# Patient Record
Sex: Male | Born: 1950
Health system: Southern US, Community
[De-identification: ages and names within clinical notes are randomized; demographics above are authoritative.]

## PROBLEM LIST (undated history)

## (undated) DIAGNOSIS — I2510 Atherosclerotic heart disease of native coronary artery without angina pectoris: Secondary | ICD-10-CM

## (undated) DIAGNOSIS — E785 Hyperlipidemia, unspecified: Secondary | ICD-10-CM

## (undated) DIAGNOSIS — Z9889 Other specified postprocedural states: Secondary | ICD-10-CM

## (undated) DIAGNOSIS — Z952 Presence of prosthetic heart valve: Secondary | ICD-10-CM

## (undated) DIAGNOSIS — J189 Pneumonia, unspecified organism: Secondary | ICD-10-CM

## (undated) DIAGNOSIS — E859 Amyloidosis, unspecified: Secondary | ICD-10-CM

## (undated) DIAGNOSIS — M47816 Spondylosis without myelopathy or radiculopathy, lumbar region: Secondary | ICD-10-CM

## (undated) DIAGNOSIS — R197 Diarrhea, unspecified: Secondary | ICD-10-CM

## (undated) DIAGNOSIS — D649 Anemia, unspecified: Secondary | ICD-10-CM

## (undated) DIAGNOSIS — K219 Gastro-esophageal reflux disease without esophagitis: Secondary | ICD-10-CM

## (undated) DIAGNOSIS — R112 Nausea with vomiting, unspecified: Secondary | ICD-10-CM

## (undated) DIAGNOSIS — I499 Cardiac arrhythmia, unspecified: Secondary | ICD-10-CM

## (undated) DIAGNOSIS — Z9289 Personal history of other medical treatment: Secondary | ICD-10-CM

## (undated) DIAGNOSIS — G629 Polyneuropathy, unspecified: Secondary | ICD-10-CM

## (undated) DIAGNOSIS — F419 Anxiety disorder, unspecified: Secondary | ICD-10-CM

## (undated) DIAGNOSIS — I350 Nonrheumatic aortic (valve) stenosis: Secondary | ICD-10-CM

## (undated) DIAGNOSIS — D638 Anemia in other chronic diseases classified elsewhere: Secondary | ICD-10-CM

## (undated) DIAGNOSIS — Z8489 Family history of other specified conditions: Secondary | ICD-10-CM

## (undated) DIAGNOSIS — I5042 Chronic combined systolic (congestive) and diastolic (congestive) heart failure: Secondary | ICD-10-CM

## (undated) DIAGNOSIS — C801 Malignant (primary) neoplasm, unspecified: Secondary | ICD-10-CM

## (undated) DIAGNOSIS — N186 End stage renal disease: Secondary | ICD-10-CM

## (undated) DIAGNOSIS — I739 Peripheral vascular disease, unspecified: Secondary | ICD-10-CM

## (undated) DIAGNOSIS — G2581 Restless legs syndrome: Secondary | ICD-10-CM

## (undated) DIAGNOSIS — I10 Essential (primary) hypertension: Secondary | ICD-10-CM

## (undated) DIAGNOSIS — R011 Cardiac murmur, unspecified: Secondary | ICD-10-CM

## (undated) HISTORY — DX: Amyloidosis, unspecified: E85.9

## (undated) HISTORY — PX: CARDIAC CATHETERIZATION: SHX172

## (undated) HISTORY — DX: End stage renal disease: N18.6

## (undated) HISTORY — DX: Hyperlipidemia, unspecified: E78.5

## (undated) HISTORY — DX: Chronic combined systolic (congestive) and diastolic (congestive) heart failure: I50.42

## (undated) HISTORY — DX: Essential (primary) hypertension: I10

## (undated) HISTORY — PX: CARDIAC VALVE REPLACEMENT: SHX585

## (undated) HISTORY — PX: ERCP W/ SPHICTEROTOMY: SHX1523

## (undated) HISTORY — PX: EYE SURGERY: SHX253

## (undated) HISTORY — PX: CATARACT EXTRACTION W/ INTRAOCULAR LENS  IMPLANT, BILATERAL: SHX1307

## (undated) HISTORY — DX: Spondylosis without myelopathy or radiculopathy, lumbar region: M47.816

## (undated) HISTORY — PX: TONSILLECTOMY: SUR1361

## (undated) HISTORY — PX: COLONOSCOPY: SHX174

## (undated) HISTORY — DX: Atherosclerotic heart disease of native coronary artery without angina pectoris: I25.10

---
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
## 2011-03-13 HISTORY — PX: LAPAROSCOPIC CHOLECYSTECTOMY: SUR755

## 2013-04-16 ENCOUNTER — Ambulatory Visit: Payer: No Typology Code available for payment source | Attending: Internal Medicine

## 2013-04-22 ENCOUNTER — Ambulatory Visit: Payer: No Typology Code available for payment source | Attending: Internal Medicine

## 2013-10-26 ENCOUNTER — Ambulatory Visit: Payer: Self-pay | Admitting: Internal Medicine

## 2013-11-06 ENCOUNTER — Encounter: Payer: Self-pay | Admitting: Internal Medicine

## 2013-11-06 ENCOUNTER — Ambulatory Visit: Payer: No Typology Code available for payment source | Attending: Internal Medicine | Admitting: Internal Medicine

## 2013-11-06 VITALS — BP 160/90 | HR 89 | Temp 98.3°F | Resp 16 | Wt 208.4 lb

## 2013-11-06 DIAGNOSIS — I10 Essential (primary) hypertension: Secondary | ICD-10-CM

## 2013-11-06 DIAGNOSIS — Z1211 Encounter for screening for malignant neoplasm of colon: Secondary | ICD-10-CM

## 2013-11-06 DIAGNOSIS — R5383 Other fatigue: Secondary | ICD-10-CM

## 2013-11-06 DIAGNOSIS — R030 Elevated blood-pressure reading, without diagnosis of hypertension: Secondary | ICD-10-CM

## 2013-11-06 DIAGNOSIS — R0989 Other specified symptoms and signs involving the circulatory and respiratory systems: Secondary | ICD-10-CM | POA: Insufficient documentation

## 2013-11-06 DIAGNOSIS — R609 Edema, unspecified: Secondary | ICD-10-CM | POA: Insufficient documentation

## 2013-11-06 DIAGNOSIS — IMO0001 Reserved for inherently not codable concepts without codable children: Secondary | ICD-10-CM

## 2013-11-06 DIAGNOSIS — Z87891 Personal history of nicotine dependence: Secondary | ICD-10-CM | POA: Insufficient documentation

## 2013-11-06 DIAGNOSIS — R5381 Other malaise: Secondary | ICD-10-CM | POA: Insufficient documentation

## 2013-11-06 DIAGNOSIS — R0609 Other forms of dyspnea: Secondary | ICD-10-CM | POA: Insufficient documentation

## 2013-11-06 DIAGNOSIS — Z139 Encounter for screening, unspecified: Secondary | ICD-10-CM

## 2013-11-06 MED ORDER — CLONIDINE HCL 0.1 MG PO TABS
0.2000 mg | ORAL_TABLET | Freq: Once | ORAL | Status: AC
  Administered 2013-11-06: 0.2 mg via ORAL

## 2013-11-06 MED ORDER — LISINOPRIL-HYDROCHLOROTHIAZIDE 10-12.5 MG PO TABS: 1.0000 | ORAL_TABLET | Freq: Every day | ORAL | Status: DC

## 2013-11-06 NOTE — Progress Notes (Signed)
Patient Demographics  Jose Garrett, is a 63 y.o. male  [id number]  [id number]  DOB - Sep 08, 1950  CC:  Chief Complaint  Patient presents with  . Establish Care       HPI: Jose Garrett is a 63 y.o. male here today to establish medical care. His blood pressure is elevated as per patient a few years ago he was told his blood pressure is high but never been on any medications, patient does report exertional dyspnea and lower extremity edema, denies any orthopnea or PND, does complain of feeling tired denies any fever chills, she is given clonidine and his repeat manual blood pressure is 160/90 denies any headache or dizziness. Patient has No headache, No chest pain, No abdominal pain - No Nausea, No new weakness tingling or numbness, No Cough - SOB.  No Known Allergies History reviewed. No pertinent past medical history. No current outpatient prescriptions on file prior to visit.   No current facility-administered medications on file prior to visit.   Family History  Problem Relation Age of Onset  . Hypertension Mother   . Diabetes Mother   . Heart disease Mother   . Hypertension Father   . Diabetes Father   . Diabetes Sister    History   Social History  . Marital Status: Legally Separated    Spouse Name: N/A    Number of Children: N/A  . Years of Education: N/A   Occupational History  . Not on file.   Social History Main Topics  . Smoking status: Former Smoker -- 40 years  . Smokeless tobacco: Not on file  . Alcohol Use: No  . Drug Use: Not on file  . Sexual Activity: Not on file   Other Topics Concern  . Not on file   Social History Narrative  . No narrative on file    Review of Systems: Constitutional: Negative for fever, chills, diaphoresis, activity change, appetite change and fatigue. HENT: Negative for ear pain, nosebleeds, congestion, facial swelling, rhinorrhea, neck pain, neck stiffness and ear discharge.  Eyes: Negative for pain,  discharge, redness, itching and visual disturbance. Respiratory: Negative for cough, choking, chest tightness, shortness of breath, wheezing and stridor.  Cardiovascular: Negative for chest pain, palpitations and leg swelling. Gastrointestinal: Negative for abdominal distention. Genitourinary: Negative for dysuria, urgency, frequency, hematuria, flank pain, decreased urine volume, difficulty urinating and dyspareunia.  Musculoskeletal: Negative for back pain, joint swelling, arthralgia and gait problem. Neurological: Negative for dizziness, tremors, seizures, syncope, facial asymmetry, speech difficulty, weakness, light-headedness, numbness and headaches.  Hematological: Negative for adenopathy. Does not bruise/bleed easily. Psychiatric/Behavioral: Negative for hallucinations, behavioral problems, confusion, dysphoric mood, decreased concentration and agitation.    Objective:   Filed Vitals:   11/06/13 1548  BP: 160/90  Pulse:   Temp:   Resp:     Physical Exam: Constitutional: Patient appears well-developed and well-nourished. No distress. HENT: Normocephalic, atraumatic, External right and left ear normal. Oropharynx is clear and moist.  Eyes: Conjunctivae and EOM are normal. PERRLA, no scleral icterus. Neck: Normal ROM. Neck supple. No JVD. No tracheal deviation. No thyromegaly. CVS: RRR, S1/S2 +, no murmurs, no gallops, no carotid bruit.  Pulmonary: Effort and breath sounds normal, no stridor, rhonchi, wheezes, rales.  Abdominal: Soft. BS +, no distension, tenderness, rebound or guarding.  Musculoskeletal: Normal range of motion. No edema and no tenderness. 2+ pedal edema  Neuro: Alert. Normal reflexes, muscle tone coordination. No cranial nerve deficit. Skin: Skin is warm and dry. No  rash noted. Not diaphoretic. No erythema. No pallor. Psychiatric: Normal mood and affect. Behavior, judgment, thought content normal.  No results found for this basename: WBC, HGB, HCT, MCV, PLT    No results found for this basename: CREATININE, BUN, NA, K, CL, CO2    No results found for this basename: HGBA1C   Lipid Panel  No results found for this basename: chol, trig, hdl, cholhdl, vldl, ldlcalc       Assessment and plan:   1. Elevated blood pressure  - cloNIDine (CATAPRES) tablet 0.2 mg; Take 2 tablets (0.2 mg total) by mouth once.  2. Essential hypertension, benign I have advised patient for DASH diet, also started on lisinopril/hydrochlorothiazide, patient will come back in 2 weeks for BP check. - lisinopril-hydrochlorothiazide (PRINZIDE,ZESTORETIC) 10-12.5 MG per tablet; Take 1 tablet by mouth daily.  Dispense: 90 tablet; Refill: 3 - COMPLETE METABOLIC PANEL WITH GFR; Future  3. Dependent edema Advise patient for low salt diet and leg elevation, we'll do echocardiogram. - 2D Echocardiogram without contrast; Future  4. DOE (dyspnea on exertion)  - 2D Echocardiogram without contrast; Future  5. Other malaise and fatigue  - CBC with Differential; and TSH level Future  6. Special screening for malignant neoplasms, colon  - Ambulatory referral to Gastroenterology  7. Screening We'll do fasting blood work.  - Lipid panel; Future - Vit D  25 hydroxy (rtn osteoporosis monitoring); Future - TSH; Future      Health Maintenance -Colonoscopy: referred to GI   Return in about 3 months (around 02/06/2014) for hypertension, BP check and blood work in 2 weeks/Nurse Visit.   Lorayne Marek, MD

## 2013-11-06 NOTE — Patient Instructions (Signed)
DASH Eating Plan °DASH stands for "Dietary Approaches to Stop Hypertension." The DASH eating plan is a healthy eating plan that has been shown to reduce high blood pressure (hypertension). Additional health benefits may include reducing the risk of type 2 diabetes mellitus, heart disease, and stroke. The DASH eating plan may also help with weight loss. °WHAT DO I NEED TO KNOW ABOUT THE DASH EATING PLAN? °For the DASH eating plan, you will follow these general guidelines: °· Choose foods with a percent daily value for sodium of less than 5% (as listed on the food label). °· Use salt-free seasonings or herbs instead of table salt or sea salt. °· Check with your health care provider or pharmacist before using salt substitutes. °· Eat lower-sodium products, often labeled as "lower sodium" or "no salt added." °· Eat fresh foods. °· Eat more vegetables, fruits, and low-fat dairy products. °· Choose whole grains. Look for the word "whole" as the first word in the ingredient list. °· Choose fish and skinless chicken or turkey more often than red meat. Limit fish, poultry, and meat to 6 oz (170 g) each day. °· Limit sweets, desserts, sugars, and sugary drinks. °· Choose heart-healthy fats. °· Limit cheese to 1 oz (28 g) per day. °· Eat more home-cooked food and less restaurant, buffet, and fast food. °· Limit fried foods. °· Cook foods using methods other than frying. °· Limit canned vegetables. If you do use them, rinse them well to decrease the sodium. °· When eating at a restaurant, ask that your food be prepared with less salt, or no salt if possible. °WHAT FOODS CAN I EAT? °Seek help from a dietitian for individual calorie needs. °Grains °Whole grain or whole wheat bread. Brown rice. Whole grain or whole wheat pasta. Quinoa, bulgur, and whole grain cereals. Low-sodium cereals. Corn or whole wheat flour tortillas. Whole grain cornbread. Whole grain crackers. Low-sodium crackers. °Vegetables °Fresh or frozen vegetables  (raw, steamed, roasted, or grilled). Low-sodium or reduced-sodium tomato and vegetable juices. Low-sodium or reduced-sodium tomato sauce and paste. Low-sodium or reduced-sodium canned vegetables.  °Fruits °All fresh, canned (in natural juice), or frozen fruits. °Meat and Other Protein Products °Ground beef (85% or leaner), grass-fed beef, or beef trimmed of fat. Skinless chicken or turkey. Ground chicken or turkey. Pork trimmed of fat. All fish and seafood. Eggs. Dried beans, peas, or lentils. Unsalted nuts and seeds. Unsalted canned beans. °Dairy °Low-fat dairy products, such as skim or 1% milk, 2% or reduced-fat cheeses, low-fat ricotta or cottage cheese, or plain low-fat yogurt. Low-sodium or reduced-sodium cheeses. °Fats and Oils °Tub margarines without trans fats. Light or reduced-fat mayonnaise and salad dressings (reduced sodium). Avocado. Safflower, olive, or canola oils. Natural peanut or almond butter. °Other °Unsalted popcorn and pretzels. °The items listed above may not be a complete list of recommended foods or beverages. Contact your dietitian for more options. °WHAT FOODS ARE NOT RECOMMENDED? °Grains °White bread. White pasta. White rice. Refined cornbread. Bagels and croissants. Crackers that contain trans fat. °Vegetables °Creamed or fried vegetables. Vegetables in a cheese sauce. Regular canned vegetables. Regular canned tomato sauce and paste. Regular tomato and vegetable juices. °Fruits °Dried fruits. Canned fruit in light or heavy syrup. Fruit juice. °Meat and Other Protein Products °Fatty cuts of meat. Ribs, chicken wings, bacon, sausage, bologna, salami, chitterlings, fatback, hot dogs, bratwurst, and packaged luncheon meats. Salted nuts and seeds. Canned beans with salt. °Dairy °Whole or 2% milk, cream, half-and-half, and cream cheese. Whole-fat or sweetened yogurt. Full-fat   cheeses or blue cheese. Nondairy creamers and whipped toppings. Processed cheese, cheese spreads, or cheese  curds. °Condiments °Onion and garlic salt, seasoned salt, table salt, and sea salt. Canned and packaged gravies. Worcestershire sauce. Tartar sauce. Barbecue sauce. Teriyaki sauce. Soy sauce, including reduced sodium. Steak sauce. Fish sauce. Oyster sauce. Cocktail sauce. Horseradish. Ketchup and mustard. Meat flavorings and tenderizers. Bouillon cubes. Hot sauce. Tabasco sauce. Marinades. Taco seasonings. Relishes. °Fats and Oils °Butter, stick margarine, lard, shortening, ghee, and bacon fat. Coconut, palm kernel, or palm oils. Regular salad dressings. °Other °Pickles and olives. Salted popcorn and pretzels. °The items listed above may not be a complete list of foods and beverages to avoid. Contact your dietitian for more information. °WHERE CAN I FIND MORE INFORMATION? °National Heart, Lung, and Blood Institute: www.nhlbi.nih.gov/health/health-topics/topics/dash/ °Document Released: 02/15/2011 Document Revised: 07/13/2013 Document Reviewed: 12/31/2012 °Exit[employer]® Patient Information ©2015 [employer]. This information is not intended to replace advice given to you by your health care provider. Make sure you discuss any questions you have with your health care provider. ° °

## 2013-11-06 NOTE — Progress Notes (Signed)
Patient here to establish care Currently not taking any prescribed medications Presents with elevated blood pressure

## 2013-11-12 ENCOUNTER — Telehealth: Payer: Self-pay | Admitting: Emergency Medicine

## 2013-11-12 ENCOUNTER — Ambulatory Visit (HOSPITAL_COMMUNITY)
Admission: RE | Admit: 2013-11-12 | Discharge: 2013-11-12 | Disposition: A | Discharge status: Home / Self Care | Payer: Self-pay | Source: Ambulatory Visit | Attending: Internal Medicine | Admitting: Internal Medicine

## 2013-11-12 DIAGNOSIS — R0989 Other specified symptoms and signs involving the circulatory and respiratory systems: Principal | ICD-10-CM | POA: Insufficient documentation

## 2013-11-12 DIAGNOSIS — I059 Rheumatic mitral valve disease, unspecified: Secondary | ICD-10-CM | POA: Insufficient documentation

## 2013-11-12 DIAGNOSIS — I359 Nonrheumatic aortic valve disorder, unspecified: Secondary | ICD-10-CM | POA: Insufficient documentation

## 2013-11-12 DIAGNOSIS — R609 Edema, unspecified: Secondary | ICD-10-CM

## 2013-11-12 DIAGNOSIS — I10 Essential (primary) hypertension: Secondary | ICD-10-CM | POA: Insufficient documentation

## 2013-11-12 DIAGNOSIS — R0609 Other forms of dyspnea: Secondary | ICD-10-CM | POA: Insufficient documentation

## 2013-11-12 NOTE — Telephone Encounter (Signed)
Message copied by Ricci Barker on Thu Nov 12, 2013 12:19 PM ------      Message from: Lorayne Marek      Created: Thu Nov 12, 2013 10:09 AM       Call and let the patient know that his echocardiogram reported EF of 55-60% which is in normal range but has some concentric hypertrophy  left ventricle which can be secondary to long-standing hypertension, He was started on blood pressure medication advise him to take the medication regularly. ------

## 2013-11-12 NOTE — Progress Notes (Signed)
  Echocardiogram 2D Echocardiogram has been performed.  Darlina Sicilian M 11/12/2013, 8:31 AM

## 2013-11-12 NOTE — Telephone Encounter (Signed)
Pt given ECHO results with instructions to take blood pressure medication daily as ordered. Pt verbalized understanding Pt scheduled for fasting blood work next Friday @ 930 am

## 2013-11-20 ENCOUNTER — Ambulatory Visit: Payer: No Typology Code available for payment source | Attending: Internal Medicine

## 2013-11-20 ENCOUNTER — Ambulatory Visit: Payer: No Typology Code available for payment source

## 2013-11-20 VITALS — BP 162/82 | HR 91 | Resp 16

## 2013-11-20 DIAGNOSIS — R5383 Other fatigue: Principal | ICD-10-CM

## 2013-11-20 DIAGNOSIS — Z139 Encounter for screening, unspecified: Secondary | ICD-10-CM

## 2013-11-20 DIAGNOSIS — I10 Essential (primary) hypertension: Secondary | ICD-10-CM

## 2013-11-20 DIAGNOSIS — R5381 Other malaise: Secondary | ICD-10-CM

## 2013-11-20 LAB — LIPID PANEL
CHOL/HDL RATIO: 5.7 ratio
CHOLESTEROL: 177 mg/dL (ref 0–200)
HDL: 31 mg/dL — AB (ref >39)
TRIGLYCERIDES: 486 mg/dL — AB (ref <150)

## 2013-11-20 LAB — COMPLETE METABOLIC PANEL WITH GFR
ALT: 15 U/L (ref 0–53)
AST: 21 U/L (ref 0–37)
Albumin: 3.7 g/dL (ref 3.5–5.2)
Alkaline Phosphatase: 65 U/L (ref 39–117)
BILIRUBIN TOTAL: 0.4 mg/dL (ref 0.2–1.2)
BUN: 19 mg/dL (ref 6–23)
CALCIUM: 9.3 mg/dL (ref 8.4–10.5)
CHLORIDE: 102 meq/L (ref 96–112)
CO2: 29 mEq/L (ref 19–32)
Creat: 2.17 mg/dL — ABNORMAL HIGH (ref 0.50–1.35)
GFR, Est African American: 37 mL/min — ABNORMAL LOW
GFR, Est Non African American: 32 mL/min — ABNORMAL LOW
Glucose, Bld: 109 mg/dL — ABNORMAL HIGH (ref 70–99)
Potassium: 3.2 mEq/L — ABNORMAL LOW (ref 3.5–5.3)
Sodium: 141 mEq/L (ref 135–145)
Total Protein: 6.6 g/dL (ref 6.0–8.3)

## 2013-11-20 LAB — TSH: TSH: 1.458 u[IU]/mL (ref 0.350–4.500)

## 2013-11-20 MED ORDER — CLONIDINE HCL 0.1 MG PO TABS
0.1000 mg | ORAL_TABLET | Freq: Once | ORAL | Status: AC
  Administered 2013-11-20: 0.1 mg via ORAL

## 2013-11-20 NOTE — Patient Instructions (Signed)
Today your blood pressure is uncontrolled. The doctor has decided to increase the dosage of your lisinopril to 2x daily.

## 2013-11-20 NOTE — Progress Notes (Unsigned)
Pt is here today for a BP check because he was put on a new medication to control his HTN.

## 2013-11-21 LAB — CBC WITH DIFFERENTIAL/PLATELET
BASOS PCT: 0 % (ref 0–1)
Basophils Absolute: 0 10*3/uL (ref 0.0–0.1)
Eosinophils Absolute: 0.1 10*3/uL (ref 0.0–0.7)
Eosinophils Relative: 1 % (ref 0–5)
HCT: 39.5 % (ref 39.0–52.0)
Hemoglobin: 13.7 g/dL (ref 13.0–17.0)
LYMPHS PCT: 41 % (ref 12–46)
Lymphs Abs: 3.5 10*3/uL (ref 0.7–4.0)
MCH: 29.7 pg (ref 26.0–34.0)
MCHC: 34.7 g/dL (ref 30.0–36.0)
MCV: 85.7 fL (ref 78.0–100.0)
Monocytes Absolute: 0.6 10*3/uL (ref 0.1–1.0)
Monocytes Relative: 7 % (ref 3–12)
Neutro Abs: 4.4 10*3/uL (ref 1.7–7.7)
Neutrophils Relative %: 51 % (ref 43–77)
PLATELETS: 278 10*3/uL (ref 150–400)
RBC: 4.61 MIL/uL (ref 4.22–5.81)
RDW: 14.8 % (ref 11.5–15.5)
WBC: 8.6 10*3/uL (ref 4.0–10.5)

## 2013-11-21 LAB — VITAMIN D 25 HYDROXY (VIT D DEFICIENCY, FRACTURES): Vit D, 25-Hydroxy: 15 ng/mL — ABNORMAL LOW (ref 30–89)

## 2013-11-26 ENCOUNTER — Other Ambulatory Visit: Payer: Self-pay

## 2013-11-26 DIAGNOSIS — I10 Essential (primary) hypertension: Secondary | ICD-10-CM

## 2013-11-26 MED ORDER — LISINOPRIL-HYDROCHLOROTHIAZIDE 10-12.5 MG PO TABS: 1.0000 | ORAL_TABLET | Freq: Two times a day (BID) | ORAL | Status: DC

## 2013-11-30 ENCOUNTER — Telehealth: Payer: Self-pay | Admitting: *Deleted

## 2013-11-30 NOTE — Telephone Encounter (Signed)
Message copied by Betti Cruz on Mon Nov 30, 2013  2:11 PM ------      Message from: Lorayne Marek      Created: Mon Nov 23, 2013 11:10 AM       Blood work reviewed, noticed low vitamin D, call patient advise to start ergocalciferol 50,000 units once a week for the duration of  12 weeks.      also noticed elevated triglycerides, advise patient for low fat diet. And start taking lopid 600 mg bid.      Also noticed   potassium is borderline low, advise patient to take KCL 20 mEq daily for 10 days.      Patient also has renal insufficiency which can be secondary to long-standing hypertension, will repeat blood chemistry on the next visit.      Also patient has impaired fasting glucose, advise him for low carbohydrate diet.                   ------

## 2013-11-30 NOTE — Telephone Encounter (Signed)
Left voice massage to return call 

## 2013-12-01 ENCOUNTER — Telehealth: Payer: Self-pay | Admitting: Internal Medicine

## 2013-12-01 ENCOUNTER — Telehealth: Payer: Self-pay | Admitting: Emergency Medicine

## 2013-12-01 MED ORDER — POTASSIUM CHLORIDE CRYS ER 20 MEQ PO TBCR: 20.0000 meq | EXTENDED_RELEASE_TABLET | Freq: Every day | ORAL | Status: DC

## 2013-12-01 MED ORDER — VITAMIN D (ERGOCALCIFEROL) 1.25 MG (50000 UNIT) PO CAPS: 50000.0000 [IU] | ORAL_CAPSULE | ORAL | Status: DC

## 2013-12-01 MED ORDER — GEMFIBROZIL 600 MG PO TABS: 600.0000 mg | ORAL_TABLET | Freq: Two times a day (BID) | ORAL | Status: DC

## 2013-12-01 NOTE — Telephone Encounter (Signed)
Pt. Returned call from nurse, please f/u with pt.

## 2013-12-01 NOTE — Telephone Encounter (Signed)
Blood work reviewed, noticed low vitamin D, call patient advise to start ergocalciferol 50,000 units once a week for the duration of 12 weeks.  also noticed elevated triglycerides, advise patient for low fat diet. And start taking lopid 600 mg bid.  Also noticed potassium is borderline low, advise patient to take KCL 20 mEq daily for 10 days.  Patient also has renal insufficiency which can be secondary to long-standing hypertension, will repeat blood chemistry on the next visit.  Also patient has impaired fasting glucose, advise him for low carbohydrate diet.   Pt informed of blood results with medications instructions  Medication/education provided Scripts sent to [place]

## 2013-12-03 ENCOUNTER — Ambulatory Visit: Payer: No Typology Code available for payment source | Attending: Internal Medicine | Admitting: *Deleted

## 2013-12-03 VITALS — BP 190/60 | HR 74

## 2013-12-03 DIAGNOSIS — I10 Essential (primary) hypertension: Secondary | ICD-10-CM | POA: Insufficient documentation

## 2013-12-03 DIAGNOSIS — Z79899 Other long term (current) drug therapy: Secondary | ICD-10-CM | POA: Insufficient documentation

## 2013-12-03 MED ORDER — AMLODIPINE BESYLATE 5 MG PO TABS: 5.0000 mg | ORAL_TABLET | Freq: Every day | ORAL | Status: DC

## 2013-12-03 MED ORDER — CLONIDINE HCL 0.1 MG PO TABS
0.2000 mg | ORAL_TABLET | Freq: Once | ORAL | Status: AC
  Administered 2013-12-03: 0.2 mg via ORAL

## 2013-12-03 NOTE — Progress Notes (Signed)
   Subjective:    Patient ID: Jose Garrett, male    DOB: [birth year], 63 y.o.   MRN: [id number]  HPI  Patient presents for BP check following increase of lisinopril to BID States he's taking all meds as directed; began potassium yesterday Patient denies headache, nausea, visual changes and chest pain    Review of Systems     Objective:   Physical Exam  BP 208/75 immediate recheck manually 218/60 P 93 SPO2 100 T98.8  BP recheck 30 minutes after clonidine administration 190/60 P 74 Patient states he now feels more relaxed. States he can tell that pulse is lower.  Patient provided information on DASH eating plan    Assessment & Plan:  Clonidine 0.2 mg given PO per provider Will add Norvasc 5 mg daily to current regimen per provider. Norvasc 5 mg #30 with 2 refills e-scribed to [facility]  Patient instructed to make a nurse appt for BP recheck in 2 weeks. Will repeat blood chemistry at that time per provider.

## 2013-12-03 NOTE — Patient Instructions (Signed)
BP recheck in 2 weeks. Will repeat blood chemistry at that time.DASH Eating Plan DASH stands for "Dietary Approaches to Stop Hypertension." The DASH eating plan is a healthy eating plan that has been shown to reduce high blood pressure (hypertension). Additional health benefits may include reducing the risk of type 2 diabetes mellitus, heart disease, and stroke. The DASH eating plan may also help with weight loss. WHAT DO I NEED TO KNOW ABOUT THE DASH EATING PLAN? For the DASH eating plan, you will follow these general guidelines:  Choose foods with a percent daily value for sodium of less than 5% (as listed on the food label).  Use salt-free seasonings or herbs instead of table salt or sea salt.  Check with your health care provider or pharmacist before using salt substitutes.  Eat lower-sodium products, often labeled as "lower sodium" or "no salt added."  Eat fresh foods.  Eat more vegetables, fruits, and low-fat dairy products.  Choose whole grains. Look for the word "whole" as the first word in the ingredient list.  Choose fish and skinless chicken or [place] more often than red meat. Limit fish, poultry, and meat to 6 oz (170 g) each day.  Limit sweets, desserts, sugars, and sugary drinks.  Choose heart-healthy fats.  Limit cheese to 1 oz (28 g) per day.  Eat more home-cooked food and less restaurant, buffet, and fast food.  Limit fried foods.  Cook foods using methods other than frying.  Limit canned vegetables. If you do use them, rinse them well to decrease the sodium.  When eating at a restaurant, ask that your food be prepared with less salt, or no salt if possible. WHAT FOODS CAN I EAT? Seek help from a dietitian for individual calorie needs. Grains Whole grain or whole wheat bread. Brown rice. Whole grain or whole wheat pasta. Quinoa, bulgur, and whole grain cereals. Low-sodium cereals. Corn or whole wheat flour tortillas. Whole grain cornbread. Whole grain crackers.  Low-sodium crackers. Vegetables Fresh or frozen vegetables (raw, steamed, roasted, or grilled). Low-sodium or reduced-sodium tomato and vegetable juices. Low-sodium or reduced-sodium tomato sauce and paste. Low-sodium or reduced-sodium canned vegetables.  Fruits All fresh, canned (in natural juice), or frozen fruits. Meat and Other Protein Products Ground beef (85% or leaner), grass-fed beef, or beef trimmed of fat. Skinless chicken or [place]. Ground chicken or [place]. Pork trimmed of fat. All fish and seafood. Eggs. Dried beans, peas, or lentils. Unsalted nuts and seeds. Unsalted canned beans. Dairy Low-fat dairy products, such as skim or 1% milk, 2% or reduced-fat cheeses, low-fat ricotta or cottage cheese, or plain low-fat yogurt. Low-sodium or reduced-sodium cheeses. Fats and Oils Tub margarines without trans fats. Light or reduced-fat mayonnaise and salad dressings (reduced sodium). Avocado. Safflower, olive, or canola oils. Natural peanut or almond butter. Other Unsalted popcorn and pretzels. The items listed above may not be a complete list of recommended foods or beverages. Contact your dietitian for more options. WHAT FOODS ARE NOT RECOMMENDED? Grains White bread. White pasta. White rice. Refined cornbread. Bagels and croissants. Crackers that contain trans fat. Vegetables Creamed or fried vegetables. Vegetables in a cheese sauce. Regular canned vegetables. Regular canned tomato sauce and paste. Regular tomato and vegetable juices. Fruits Dried fruits. Canned fruit in light or heavy syrup. Fruit juice. Meat and Other Protein Products Fatty cuts of meat. Ribs, chicken wings, bacon, sausage, bologna, salami, chitterlings, fatback, hot dogs, bratwurst, and packaged luncheon meats. Salted nuts and seeds. Canned beans with salt. Dairy Whole or 2%  milk, cream, half-and-half, and cream cheese. Whole-fat or sweetened yogurt. Full-fat cheeses or blue cheese. Nondairy creamers and whipped  toppings. Processed cheese, cheese spreads, or cheese curds. Condiments Onion and garlic salt, seasoned salt, table salt, and sea salt. Canned and packaged gravies. Worcestershire sauce. Tartar sauce. Barbecue sauce. Teriyaki sauce. Soy sauce, including reduced sodium. Steak sauce. Fish sauce. Oyster sauce. Cocktail sauce. Horseradish. Ketchup and mustard. Meat flavorings and tenderizers. Bouillon cubes. Hot sauce. Tabasco sauce. Marinades. Taco seasonings. Relishes. Fats and Oils Butter, stick margarine, lard, shortening, ghee, and bacon fat. Coconut, palm kernel, or palm oils. Regular salad dressings. Other Pickles and olives. Salted popcorn and pretzels. The items listed above may not be a complete list of foods and beverages to avoid. Contact your dietitian for more information. WHERE CAN I FIND MORE INFORMATION? National Heart, Lung, and Blood Institute: travelstabloid.com Document Released: 02/15/2011 Document Revised: 07/13/2013 Document Reviewed: 12/31/2012 [facility] Patient Information 2015 [place], [place]. This information is not intended to replace advice given to you by your health care provider. Make sure you discuss any questions you have with your health care provider.

## 2013-12-07 ENCOUNTER — Ambulatory Visit: Payer: No Typology Code available for payment source | Attending: Internal Medicine

## 2013-12-08 ENCOUNTER — Telehealth: Payer: Self-pay | Admitting: Emergency Medicine

## 2013-12-08 ENCOUNTER — Telehealth: Payer: Self-pay | Admitting: Internal Medicine

## 2013-12-08 NOTE — Telephone Encounter (Signed)
Advise patient to start taking OTC vitamin D  2000 IU daily.

## 2013-12-08 NOTE — Telephone Encounter (Signed)
Pt calls in with difficulty swallowing Vitamin D pill. States he has poor gag reflex.states medication Gel capsule States the pill size too big. Pt tried to take medication with applesauce.

## 2013-12-08 NOTE — Telephone Encounter (Signed)
Pt instructed to purchase otc Vitamin D 2000 units daily Pt verbalized understanding

## 2013-12-08 NOTE — Telephone Encounter (Signed)
Pt. Has some questions in regards to his Vitamin D, Ergocalciferol, (DRISDOL) 50000 UNITS CAPS capsule. Pt. Would like to speak to a nurse. Please f/u with pt.

## 2013-12-11 ENCOUNTER — Encounter: Payer: Self-pay | Admitting: Internal Medicine

## 2013-12-17 ENCOUNTER — Ambulatory Visit: Payer: Self-pay | Attending: Internal Medicine

## 2013-12-17 NOTE — Progress Notes (Unsigned)
   Subjective:    Patient ID: Jose Garrett, male    DOB: [birth year], 63 y.o.   MRN: [id number]  HPI    Review of Systems     Objective:   Physical Exam        Assessment & Plan:  Pt in our clinic for nurse visit BP Check Pt stated taking medication as prescribed, work hard last nigh and maybe his BP will be up BP 182/75 P 90 Recheck manually BP 170/62 Recommended DASH Diet and return in 2 weeks for recheck  Continue medication as PCP prescribe

## 2013-12-19 ENCOUNTER — Other Ambulatory Visit: Payer: Self-pay | Admitting: Internal Medicine

## 2013-12-21 ENCOUNTER — Other Ambulatory Visit: Payer: Self-pay | Admitting: Internal Medicine

## 2013-12-21 ENCOUNTER — Other Ambulatory Visit: Payer: Self-pay

## 2013-12-22 ENCOUNTER — Telehealth: Payer: Self-pay | Admitting: Internal Medicine

## 2013-12-22 ENCOUNTER — Other Ambulatory Visit: Payer: Self-pay | Admitting: Internal Medicine

## 2013-12-22 NOTE — Telephone Encounter (Signed)
Pt. Called to request a refill on potassium chloride SA (K-DUR,KLOR-CON) 20 MEQ tablet. Pt also had some questions about his blood pressure medication and would like to speak to nurse. Please f/u with pt.

## 2013-12-24 ENCOUNTER — Telehealth: Payer: Self-pay | Admitting: Emergency Medicine

## 2013-12-24 NOTE — Telephone Encounter (Signed)
Clarified with pt Potassium was only prescribed to take x 10 days. BP medication questions addressed and resolved.

## 2013-12-25 ENCOUNTER — Other Ambulatory Visit: Payer: Self-pay

## 2013-12-31 ENCOUNTER — Ambulatory Visit: Payer: Self-pay | Attending: Internal Medicine

## 2013-12-31 NOTE — Progress Notes (Unsigned)
Patient ID: Jose Garrett, male   DOB: [birth year], 63 y.o.   MRN: [id number] Pt comes in today for blood pressure recheck s/p elevated BP with medication Pt states he took Lisinopril-HCTZ 10-12.5 mg tab this morning but not Norvasc  Denies chest pain,headaches, or SOB BP- 176/68 75 with repeat manual Instructed pt to take Norvasc with Lisinopril in the morning with repeat Lisinopril in PM as ordered and return in 2 weeks for nurse visit

## 2013-12-31 NOTE — Patient Instructions (Addendum)
Take Norvasc with Lisinopril in the morning daily Follow DASH diet and return in 2 weeks for repeat BP, Nurse visitWater Intoxication Drinking fluids is important, but drinking too much can cause problems. Problems happen if water intake happens quickly over a short period of time and is faster than your body can remove it. Electrolytes (like salts in your body) and water continually move back and forth across cell membranes to keep them in balance. When this balance is upset, it can be very harmful or even fatal. Drinking too much water can lead to a condition known as water intoxication. It can also lead to a watering down of sodium in the body. This is called hyponatremia. Hyponatremia causes a shift of water from fluid outside your cells to the fluid in your cells. This causes cells to swell. If the cells in your brain swell too much, it can cause death. CAUSES   Water intoxication is more commonly seen in young infants.  Babies can get water intoxication from drinking several bottles of water a day or by drinking formula that has been diluted too much.  Their young age and small body mass makes it easy to take in too much water.  Athletes can also suffer from water intoxication.  They can do this when they replace fluids rapidly after working out without replacing salt (sodium and electrolytes). SYMPTOMS  Water intoxication is similar to drowning in fresh water. Untreated electrolyte imbalance and tissue swelling can cause:  Irregular heartbeat.  Collapse with unconsciousness.  Fluid in the lungs (pulmonary edema).  Fluttering eyelids.  Swelling of the brain and nerves. This can cause problems similar to alcohol intoxication and includes confusion, seizures, coma and finally, death.) DIAGNOSIS  Your caregiver may suspect what is wrong by the problems you are having. A test of the sodium levels in your blood can also help make the diagnosis. TREATMENT   Water intoxication is best  prevented. You are unlikely to get water intoxication, even when drinking a lot of water, if the intake is over time. Most normal adults need about three quarts of fluid each day. That also includes what you take in with food. You may need more water if:  The weather is very warm or very dry.  You are exercising.  You are taking certain medications.  Although it is possible to drink too much water, it is a very uncommon condition.  If water intoxication does happen, medications such as a water pill (diuretic) help correct the problem. Also, salts given through the vein are also helpful. PROGNOSIS  If treatment is given before tissue swelling is severe and causes damage to your brain, a complete recovery over a couple of days is usual.  Document Released: 04/04/2005 Document Revised: 05/21/2011 Document Reviewed: 06/06/2005 [facility] Patient Information 2015 [place], [place]. This information is not intended to replace advice given to you by your health care provider. Make sure you discuss any questions you have with your health care provider. DASH Eating Plan DASH stands for "Dietary Approaches to Stop Hypertension." The DASH eating plan is a healthy eating plan that has been shown to reduce high blood pressure (hypertension). Additional health benefits may include reducing the risk of type 2 diabetes mellitus, heart disease, and stroke. The DASH eating plan may also help with weight loss. WHAT DO I NEED TO KNOW ABOUT THE DASH EATING PLAN? For the DASH eating plan, you will follow these general guidelines:  Choose foods with a percent daily value for sodium  of less than 5% (as listed on the food label).  Use salt-free seasonings or herbs instead of table salt or sea salt.  Check with your health care provider or pharmacist before using salt substitutes.  Eat lower-sodium products, often labeled as "lower sodium" or "no salt added."  Eat fresh foods.  Eat more vegetables, fruits, and  low-fat dairy products.  Choose whole grains. Look for the word "whole" as the first word in the ingredient list.  Choose fish and skinless chicken or [place] more often than red meat. Limit fish, poultry, and meat to 6 oz (170 g) each day.  Limit sweets, desserts, sugars, and sugary drinks.  Choose heart-healthy fats.  Limit cheese to 1 oz (28 g) per day.  Eat more home-cooked food and less restaurant, buffet, and fast food.  Limit fried foods.  Cook foods using methods other than frying.  Limit canned vegetables. If you do use them, rinse them well to decrease the sodium.  When eating at a restaurant, ask that your food be prepared with less salt, or no salt if possible. WHAT FOODS CAN I EAT? Seek help from a dietitian for individual calorie needs. Grains Whole grain or whole wheat bread. Brown rice. Whole grain or whole wheat pasta. Quinoa, bulgur, and whole grain cereals. Low-sodium cereals. Corn or whole wheat flour tortillas. Whole grain cornbread. Whole grain crackers. Low-sodium crackers. Vegetables Fresh or frozen vegetables (raw, steamed, roasted, or grilled). Low-sodium or reduced-sodium tomato and vegetable juices. Low-sodium or reduced-sodium tomato sauce and paste. Low-sodium or reduced-sodium canned vegetables.  Fruits All fresh, canned (in natural juice), or frozen fruits. Meat and Other Protein Products Ground beef (85% or leaner), grass-fed beef, or beef trimmed of fat. Skinless chicken or [place]. Ground chicken or [place]. Pork trimmed of fat. All fish and seafood. Eggs. Dried beans, peas, or lentils. Unsalted nuts and seeds. Unsalted canned beans. Dairy Low-fat dairy products, such as skim or 1% milk, 2% or reduced-fat cheeses, low-fat ricotta or cottage cheese, or plain low-fat yogurt. Low-sodium or reduced-sodium cheeses. Fats and Oils Tub margarines without trans fats. Light or reduced-fat mayonnaise and salad dressings (reduced sodium). Avocado. Safflower,  olive, or canola oils. Natural peanut or almond butter. Other Unsalted popcorn and pretzels. The items listed above may not be a complete list of recommended foods or beverages. Contact your dietitian for more options. WHAT FOODS ARE NOT RECOMMENDED? Grains White bread. White pasta. White rice. Refined cornbread. Bagels and croissants. Crackers that contain trans fat. Vegetables Creamed or fried vegetables. Vegetables in a cheese sauce. Regular canned vegetables. Regular canned tomato sauce and paste. Regular tomato and vegetable juices. Fruits Dried fruits. Canned fruit in light or heavy syrup. Fruit juice. Meat and Other Protein Products Fatty cuts of meat. Ribs, chicken wings, bacon, sausage, bologna, salami, chitterlings, fatback, hot dogs, bratwurst, and packaged luncheon meats. Salted nuts and seeds. Canned beans with salt. Dairy Whole or 2% milk, cream, half-and-half, and cream cheese. Whole-fat or sweetened yogurt. Full-fat cheeses or blue cheese. Nondairy creamers and whipped toppings. Processed cheese, cheese spreads, or cheese curds. Condiments Onion and garlic salt, seasoned salt, table salt, and sea salt. Canned and packaged gravies. Worcestershire sauce. Tartar sauce. Barbecue sauce. Teriyaki sauce. Soy sauce, including reduced sodium. Steak sauce. Fish sauce. Oyster sauce. Cocktail sauce. Horseradish. Ketchup and mustard. Meat flavorings and tenderizers. Bouillon cubes. Hot sauce. Tabasco sauce. Marinades. Taco seasonings. Relishes. Fats and Oils Butter, stick margarine, lard, shortening, ghee, and bacon fat. Coconut, palm kernel, or palm  oils. Regular salad dressings. Other Pickles and olives. Salted popcorn and pretzels. The items listed above may not be a complete list of foods and beverages to avoid. Contact your dietitian for more information. WHERE CAN I FIND MORE INFORMATION? National Heart, Lung, and Blood Institute:  travelstabloid.com Document Released: 02/15/2011 Document Revised: 07/13/2013 Document Reviewed: 12/31/2012 [facility] Patient Information 2015 [place], [place]. This information is not intended to replace advice given to you by your health care provider. Make sure you discuss any questions you have with your health care provider.

## 2014-01-14 ENCOUNTER — Other Ambulatory Visit: Payer: Self-pay | Admitting: Internal Medicine

## 2014-01-15 ENCOUNTER — Other Ambulatory Visit: Payer: Self-pay | Admitting: *Deleted

## 2014-01-15 MED ORDER — GEMFIBROZIL 600 MG PO TABS: 600.0000 mg | ORAL_TABLET | Freq: Two times a day (BID) | ORAL | Status: DC

## 2014-01-15 NOTE — Progress Notes (Signed)
Pt called needing a refill for his LOPID. I sent this medication to his pharmacy.

## 2014-01-21 ENCOUNTER — Encounter: Payer: Self-pay | Admitting: Internal Medicine

## 2014-01-21 ENCOUNTER — Ambulatory Visit: Payer: Self-pay | Attending: Internal Medicine | Admitting: Internal Medicine

## 2014-01-21 VITALS — BP 170/80 | HR 86 | Temp 97.9°F | Resp 16 | Wt 193.8 lb

## 2014-01-21 DIAGNOSIS — E876 Hypokalemia: Secondary | ICD-10-CM

## 2014-01-21 DIAGNOSIS — E781 Pure hyperglyceridemia: Secondary | ICD-10-CM | POA: Insufficient documentation

## 2014-01-21 DIAGNOSIS — E559 Vitamin D deficiency, unspecified: Secondary | ICD-10-CM | POA: Insufficient documentation

## 2014-01-21 DIAGNOSIS — Z9049 Acquired absence of other specified parts of digestive tract: Secondary | ICD-10-CM | POA: Insufficient documentation

## 2014-01-21 DIAGNOSIS — I10 Essential (primary) hypertension: Secondary | ICD-10-CM

## 2014-01-21 DIAGNOSIS — Z87891 Personal history of nicotine dependence: Secondary | ICD-10-CM | POA: Insufficient documentation

## 2014-01-21 LAB — COMPLETE METABOLIC PANEL WITH GFR
ALBUMIN: 4.5 g/dL (ref 3.5–5.2)
ALT: 67 U/L — ABNORMAL HIGH (ref 0–53)
AST: 43 U/L — ABNORMAL HIGH (ref 0–37)
Alkaline Phosphatase: 60 U/L (ref 39–117)
BUN: 40 mg/dL — ABNORMAL HIGH (ref 6–23)
CALCIUM: 9.9 mg/dL (ref 8.4–10.5)
CO2: 17 mEq/L — ABNORMAL LOW (ref 19–32)
CREATININE: 2.49 mg/dL — AB (ref 0.50–1.35)
Chloride: 111 mEq/L (ref 96–112)
GFR, Est African American: 31 mL/min — ABNORMAL LOW
GFR, Est Non African American: 27 mL/min — ABNORMAL LOW
GLUCOSE: 104 mg/dL — AB (ref 70–99)
POTASSIUM: 5.8 meq/L — AB (ref 3.5–5.3)
Sodium: 139 mEq/L (ref 135–145)
Total Bilirubin: 0.3 mg/dL (ref 0.2–1.2)
Total Protein: 7.4 g/dL (ref 6.0–8.3)

## 2014-01-21 MED ORDER — CLONIDINE HCL 0.1 MG PO TABS
0.1000 mg | ORAL_TABLET | Freq: Once | ORAL | Status: AC
  Administered 2014-01-21: 0.1 mg via ORAL

## 2014-01-21 MED ORDER — AMLODIPINE BESYLATE 10 MG PO TABS: 10.0000 mg | ORAL_TABLET | Freq: Every day | ORAL | Status: DC

## 2014-01-21 NOTE — Progress Notes (Signed)
Patient states he is here for re check on his blood Pressure Patient states he is fasting for additional blood work

## 2014-01-21 NOTE — Patient Instructions (Signed)
Fat and Cholesterol Control Diet Fat and cholesterol levels in your blood and organs are influenced by your diet. High levels of fat and cholesterol may lead to diseases of the heart, small and large blood vessels, gallbladder, liver, and pancreas. CONTROLLING FAT AND CHOLESTEROL WITH DIET Although exercise and lifestyle factors are important, your diet is key. That is because certain foods are known to raise cholesterol and others to lower it. The goal is to balance foods for their effect on cholesterol and more importantly, to replace saturated and trans fat with other types of fat, such as monounsaturated fat, polyunsaturated fat, and omega-3 fatty acids. On average, a person should consume no more than 15 to 17 g of saturated fat daily. Saturated and trans fats are considered "bad" fats, and they will raise LDL cholesterol. Saturated fats are primarily found in animal products such as meats, butter, and cream. However, that does not mean you need to give up all your favorite foods. Today, there are good tasting, low-fat, low-cholesterol substitutes for most of the things you like to eat. Choose low-fat or nonfat alternatives. Choose round or loin cuts of red meat. These types of cuts are lowest in fat and cholesterol. Chicken (without the skin), fish, veal, and ground turkey breast are great choices. Eliminate fatty meats, such as hot dogs and salami. Even shellfish have little or no saturated fat. Have a 3 oz (85 g) portion when you eat lean meat, poultry, or fish. Trans fats are also called "partially hydrogenated oils." They are oils that have been scientifically manipulated so that they are solid at room temperature resulting in a longer shelf life and improved taste and texture of foods in which they are added. Trans fats are found in stick margarine, some tub margarines, cookies, crackers, and baked goods.  When baking and cooking, oils are a great substitute for butter. The monounsaturated oils are  especially beneficial since it is believed they lower LDL and raise HDL. The oils you should avoid entirely are saturated tropical oils, such as coconut and palm.  Remember to eat a lot from food groups that are naturally free of saturated and trans fat, including fish, fruit, vegetables, beans, grains (barley, rice, couscous, bulgur wheat), and pasta (without cream sauces).  IDENTIFYING FOODS THAT LOWER FAT AND CHOLESTEROL  Soluble fiber may lower your cholesterol. This type of fiber is found in fruits such as apples, vegetables such as broccoli, potatoes, and carrots, legumes such as beans, peas, and lentils, and grains such as barley. Foods fortified with plant sterols (phytosterol) may also lower cholesterol. You should eat at least 2 g per day of these foods for a cholesterol lowering effect.  Read package labels to identify low-saturated fats, trans fat free, and low-fat foods at the supermarket. Select cheeses that have only 2 to 3 g saturated fat per ounce. Use a heart-healthy tub margarine that is free of trans fats or partially hydrogenated oil. When buying baked goods (cookies, crackers), avoid partially hydrogenated oils. Breads and muffins should be made from whole grains (whole-wheat or whole oat flour, instead of "flour" or "enriched flour"). Buy non-creamy canned soups with reduced salt and no added fats.  FOOD PREPARATION TECHNIQUES  Never deep-fry. If you must fry, either stir-fry, which uses very little fat, or use non-stick cooking sprays. When possible, broil, bake, or roast meats, and steam vegetables. Instead of putting butter or margarine on vegetables, use lemon and herbs, applesauce, and cinnamon (for squash and sweet potatoes). Use nonfat   yogurt, salsa, and low-fat dressings for salads.  LOW-SATURATED FAT / LOW-FAT FOOD SUBSTITUTES Meats / Saturated Fat (g)  Avoid: Steak, marbled (3 oz/85 g) / 11 g  Choose: Steak, lean (3 oz/85 g) / 4 g  Avoid: Hamburger (3 oz/85 g) / 7  g  Choose: Hamburger, lean (3 oz/85 g) / 5 g  Avoid: Ham (3 oz/85 g) / 6 g  Choose: Ham, lean cut (3 oz/85 g) / 2.4 g  Avoid: Chicken, with skin, dark meat (3 oz/85 g) / 4 g  Choose: Chicken, skin removed, dark meat (3 oz/85 g) / 2 g  Avoid: Chicken, with skin, light meat (3 oz/85 g) / 2.5 g  Choose: Chicken, skin removed, light meat (3 oz/85 g) / 1 g Dairy / Saturated Fat (g)  Avoid: Whole milk (1 cup) / 5 g  Choose: Low-fat milk, 2% (1 cup) / 3 g  Choose: Low-fat milk, 1% (1 cup) / 1.5 g  Choose: Skim milk (1 cup) / 0.3 g  Avoid: Hard cheese (1 oz/28 g) / 6 g  Choose: Skim milk cheese (1 oz/28 g) / 2 to 3 g  Avoid: Cottage cheese, 4% fat (1 cup) / 6.5 g  Choose: Low-fat cottage cheese, 1% fat (1 cup) / 1.5 g  Avoid: Ice cream (1 cup) / 9 g  Choose: Sherbet (1 cup) / 2.5 g  Choose: Nonfat frozen yogurt (1 cup) / 0.3 g  Choose: Frozen fruit bar / trace  Avoid: Whipped cream (1 tbs) / 3.5 g  Choose: Nondairy whipped topping (1 tbs) / 1 g Condiments / Saturated Fat (g)  Avoid: Mayonnaise (1 tbs) / 2 g  Choose: Low-fat mayonnaise (1 tbs) / 1 g  Avoid: Butter (1 tbs) / 7 g  Choose: Extra light margarine (1 tbs) / 1 g  Avoid: Coconut oil (1 tbs) / 11.8 g  Choose: Olive oil (1 tbs) / 1.8 g  Choose: Corn oil (1 tbs) / 1.7 g  Choose: Safflower oil (1 tbs) / 1.2 g  Choose: Sunflower oil (1 tbs) / 1.4 g  Choose: Soybean oil (1 tbs) / 2.4 g  Choose: Canola oil (1 tbs) / 1 g Document Released: 02/26/2005 Document Revised: 06/23/2012 Document Reviewed: 05/27/2013 [employer] Patient Information 2015 [employer]. This information is not intended to replace advice given to you by your health care provider. Make sure you discuss any questions you have with your health care provider. DASH Eating Plan DASH stands for "Dietary Approaches to Stop Hypertension." The DASH eating plan is a healthy eating plan that has been shown to reduce high blood pressure  (hypertension). Additional health benefits may include reducing the risk of type 2 diabetes mellitus, heart disease, and stroke. The DASH eating plan may also help with weight loss. WHAT DO I NEED TO KNOW ABOUT THE DASH EATING PLAN? For the DASH eating plan, you will follow these general guidelines:  Choose foods with a percent daily value for sodium of less than 5% (as listed on the food label).  Use salt-free seasonings or herbs instead of table salt or sea salt.  Check with your health care provider or pharmacist before using salt substitutes.  Eat lower-sodium products, often labeled as "lower sodium" or "no salt added."  Eat fresh foods.  Eat more vegetables, fruits, and low-fat dairy products.  Choose whole grains. Look for the word "whole" as the first word in the ingredient list.  Choose fish and skinless chicken or turkey more often than   red meat. Limit fish, poultry, and meat to 6 oz (170 g) each day.  Limit sweets, desserts, sugars, and sugary drinks.  Choose heart-healthy fats.  Limit cheese to 1 oz (28 g) per day.  Eat more home-cooked food and less restaurant, buffet, and fast food.  Limit fried foods.  Cook foods using methods other than frying.  Limit canned vegetables. If you do use them, rinse them well to decrease the sodium.  When eating at a restaurant, ask that your food be prepared with less salt, or no salt if possible. WHAT FOODS CAN I EAT? Seek help from a dietitian for individual calorie needs. Grains Whole grain or whole wheat bread. Brown rice. Whole grain or whole wheat pasta. Quinoa, bulgur, and whole grain cereals. Low-sodium cereals. Corn or whole wheat flour tortillas. Whole grain cornbread. Whole grain crackers. Low-sodium crackers. Vegetables Fresh or frozen vegetables (raw, steamed, roasted, or grilled). Low-sodium or reduced-sodium tomato and vegetable juices. Low-sodium or reduced-sodium tomato sauce and paste. Low-sodium or  reduced-sodium canned vegetables.  Fruits All fresh, canned (in natural juice), or frozen fruits. Meat and Other Protein Products Ground beef (85% or leaner), grass-fed beef, or beef trimmed of fat. Skinless chicken or turkey. Ground chicken or turkey. Pork trimmed of fat. All fish and seafood. Eggs. Dried beans, peas, or lentils. Unsalted nuts and seeds. Unsalted canned beans. Dairy Low-fat dairy products, such as skim or 1% milk, 2% or reduced-fat cheeses, low-fat ricotta or cottage cheese, or plain low-fat yogurt. Low-sodium or reduced-sodium cheeses. Fats and Oils Tub margarines without trans fats. Light or reduced-fat mayonnaise and salad dressings (reduced sodium). Avocado. Safflower, olive, or canola oils. Natural peanut or almond butter. Other Unsalted popcorn and pretzels. The items listed above may not be a complete list of recommended foods or beverages. Contact your dietitian for more options. WHAT FOODS ARE NOT RECOMMENDED? Grains White bread. White pasta. White rice. Refined cornbread. Bagels and croissants. Crackers that contain trans fat. Vegetables Creamed or fried vegetables. Vegetables in a cheese sauce. Regular canned vegetables. Regular canned tomato sauce and paste. Regular tomato and vegetable juices. Fruits Dried fruits. Canned fruit in light or heavy syrup. Fruit juice. Meat and Other Protein Products Fatty cuts of meat. Ribs, chicken wings, bacon, sausage, bologna, salami, chitterlings, fatback, hot dogs, bratwurst, and packaged luncheon meats. Salted nuts and seeds. Canned beans with salt. Dairy Whole or 2% milk, cream, half-and-half, and cream cheese. Whole-fat or sweetened yogurt. Full-fat cheeses or blue cheese. Nondairy creamers and whipped toppings. Processed cheese, cheese spreads, or cheese curds. Condiments Onion and garlic salt, seasoned salt, table salt, and sea salt. Canned and packaged gravies. Worcestershire sauce. Tartar sauce. Barbecue sauce. Teriyaki  sauce. Soy sauce, including reduced sodium. Steak sauce. Fish sauce. Oyster sauce. Cocktail sauce. Horseradish. Ketchup and mustard. Meat flavorings and tenderizers. Bouillon cubes. Hot sauce. Tabasco sauce. Marinades. Taco seasonings. Relishes. Fats and Oils Butter, stick margarine, lard, shortening, ghee, and bacon fat. Coconut, palm kernel, or palm oils. Regular salad dressings. Other Pickles and olives. Salted popcorn and pretzels. The items listed above may not be a complete list of foods and beverages to avoid. Contact your dietitian for more information. WHERE CAN I FIND MORE INFORMATION? National Heart, Lung, and Blood Institute: www.nhlbi.nih.gov/health/health-topics/topics/dash/ Document Released: 02/15/2011 Document Revised: 07/13/2013 Document Reviewed: 12/31/2012 [employer] Patient Information 2015 [employer]. This information is not intended to replace advice given to you by your health care provider. Make sure you discuss any questions you have with your health care   provider.  

## 2014-01-21 NOTE — Progress Notes (Signed)
MRN: [id number] Name: Jose Garrett  Sex: male Age: 63 y.o. DOB: [birth year]  Allergies: Review of patient's allergies indicates no known allergies.  Chief Complaint  Patient presents with  . Blood Pressure Check    HPI: Patient is 63 y.o. male who history of hypertension, hypertriglyceridemia comes today for followup, his blood pressure is elevated, he was given clonidine and his repeat manual blood pressure is 170/80 as per patient he has been taking lisinopril/hydrochlorothiazide as well as Norvasc 5 mg daily he denies any headache dizziness, he also history of vitamin D deficiency and is taking vitamin D supplement, and also taking Lopid 600 mg twice a day.  History reviewed. No pertinent past medical history.  Past Surgical History  Procedure Laterality Date  . Cholecystectomy        Medication List       This list is accurate as of: 01/21/14 11:32 AM.  Always use your most recent med list.               amLODipine 10 MG tablet  Commonly known as:  NORVASC  Take 1 tablet (10 mg total) by mouth daily.     gemfibrozil 600 MG tablet  Commonly known as:  LOPID  Take 1 tablet (600 mg total) by mouth 2 (two) times daily before a meal.     lisinopril-hydrochlorothiazide 10-12.5 MG per tablet  Commonly known as:  PRINZIDE,ZESTORETIC  Take 1 tablet by mouth 2 (two) times daily.        Meds ordered this encounter  Medications  . cloNIDine (CATAPRES) tablet 0.1 mg    Sig:   . amLODipine (NORVASC) 10 MG tablet    Sig: Take 1 tablet (10 mg total) by mouth daily.    Dispense:  30 tablet    Refill:  3     There is no immunization history on file for this patient.  Family History  Problem Relation Age of Onset  . Hypertension Mother   . Diabetes Mother   . Heart disease Mother   . Hypertension Father   . Diabetes Father   . Diabetes Sister     History  Substance Use Topics  . Smoking status: Former Smoker -- 40 years  . Smokeless tobacco: Not on file    . Alcohol Use: No    Review of Systems   As noted in HPI  Filed Vitals:   01/21/14 1132  BP: 170/80  Pulse:   Temp:   Resp:     Physical Exam  Physical Exam  Constitutional: No distress.  Eyes: EOM are normal. Pupils are equal, round, and reactive to light.  Cardiovascular: Normal rate and regular rhythm.   Pulmonary/Chest: Breath sounds normal. No respiratory distress. He has no wheezes. He has no rales.    CBC    Component Value Date/Time   WBC 8.6 11/20/2013 0911   RBC 4.61 11/20/2013 0911   HGB 13.7 11/20/2013 0911   HCT 39.5 11/20/2013 0911   PLT 278 11/20/2013 0911   MCV 85.7 11/20/2013 0911   LYMPHSABS 3.5 11/20/2013 0911   MONOABS 0.6 11/20/2013 0911   EOSABS 0.1 11/20/2013 0911   BASOSABS 0.0 11/20/2013 0911    CMP     Component Value Date/Time   NA 141 11/20/2013 0911   K 3.2* 11/20/2013 0911   CL 102 11/20/2013 0911   CO2 29 11/20/2013 0911   GLUCOSE 109* 11/20/2013 0911   BUN 19 11/20/2013 0911   CREATININE 2.17* 11/20/2013  [id number]   CALCIUM 9.3 11/20/2013 0911   PROT 6.6 11/20/2013 0911   ALBUMIN 3.7 11/20/2013 0911   AST 21 11/20/2013 0911   ALT 15 11/20/2013 0911   ALKPHOS 65 11/20/2013 0911   BILITOT 0.4 11/20/2013 0911   GFRNONAA 32* 11/20/2013 0911   GFRAA 37* 11/20/2013 0911    Lab Results  Component Value Date/Time   CHOL 177 11/20/2013 09:11 AM    No components found for: HGA1C  Lab Results  Component Value Date/Time   AST 21 11/20/2013 09:11 AM    Assessment and Plan  Essential hypertension, benign - Plan: blood pressure is still uncontrolled, I have increased the dose of Norvasc, he will continue with lisinopril/hydrochlorothiazide, will come back in 2 weeks for BP check.COMPLETE METABOLIC PANEL WITH GFR, cloNIDine (CATAPRES) tablet 0.1 mg, amLODipine (NORVASC) 10 MG tablet  Vitamin D deficiency Continue with over-the-counter vitamin D supplement.  Hypertriglyceridemia Advised for low-fat diet, continue with Lopid.  Will repeat fasting lipid panel on the next visit.  Hypokalemia - Plan: COMPLETE METABOLIC PANEL WITH GFR   Return in about 3 months (around 04/23/2014) for BP check in 2 weeks/Nurse Visit.  Lorayne Marek, MD

## 2014-01-22 ENCOUNTER — Telehealth: Payer: Self-pay

## 2014-01-22 MED ORDER — METOPROLOL TARTRATE 100 MG PO TABS: 100.0000 mg | ORAL_TABLET | Freq: Two times a day (BID) | ORAL | Status: DC

## 2014-01-22 NOTE — Telephone Encounter (Signed)
-----   Message from Lorayne Marek, MD sent at 01/22/2014  1:00 PM EST ----- Blood work reviewed noticed worsening renal function and elevated potassium level, advise patient to discontinue lisinopril/hydrochlorothiazide, start taking metoprolol 100 mg twice a day, continue with Norvasc 10 mg daily, will repeat his blood chemistry when he comes back for nurse visit for BP check in 2 weeks.

## 2014-01-22 NOTE — Telephone Encounter (Signed)
Spoke with patient and he is aware of his labs and  To stop taking his lisinopril

## 2014-02-11 ENCOUNTER — Encounter: Payer: Self-pay | Admitting: Internal Medicine

## 2014-02-19 ENCOUNTER — Other Ambulatory Visit: Payer: Self-pay | Admitting: Internal Medicine

## 2014-02-22 ENCOUNTER — Other Ambulatory Visit: Payer: Self-pay

## 2014-02-22 ENCOUNTER — Other Ambulatory Visit: Payer: Self-pay | Admitting: Internal Medicine

## 2014-02-22 ENCOUNTER — Telehealth: Payer: Self-pay

## 2014-02-22 MED ORDER — GEMFIBROZIL 600 MG PO TABS: 600.0000 mg | ORAL_TABLET | Freq: Two times a day (BID) | ORAL | Status: DC

## 2014-02-22 NOTE — Telephone Encounter (Signed)
Patient called requesting a refill on his gemfibrozil and also needing an appointment for lab [facility] called transferred to front desk to make an appontment

## 2014-02-25 ENCOUNTER — Ambulatory Visit: Payer: Self-pay | Attending: Internal Medicine

## 2014-02-25 DIAGNOSIS — Z0000 Encounter for general adult medical examination without abnormal findings: Secondary | ICD-10-CM

## 2014-02-25 LAB — COMPLETE METABOLIC PANEL WITH GFR
ALK PHOS: 62 U/L (ref 39–117)
ALT: 10 U/L (ref 0–53)
AST: 17 U/L (ref 0–37)
Albumin: 3.7 g/dL (ref 3.5–5.2)
BILIRUBIN TOTAL: 0.3 mg/dL (ref 0.2–1.2)
BUN: 27 mg/dL — ABNORMAL HIGH (ref 6–23)
CO2: 18 mEq/L — ABNORMAL LOW (ref 19–32)
Calcium: 9 mg/dL (ref 8.4–10.5)
Chloride: 115 mEq/L — ABNORMAL HIGH (ref 96–112)
Creat: 2.56 mg/dL — ABNORMAL HIGH (ref 0.50–1.35)
GFR, Est African American: 30 mL/min — ABNORMAL LOW
GFR, Est Non African American: 26 mL/min — ABNORMAL LOW
GLUCOSE: 95 mg/dL (ref 70–99)
Potassium: 4.9 mEq/L (ref 3.5–5.3)
Sodium: 144 mEq/L (ref 135–145)
TOTAL PROTEIN: 6.5 g/dL (ref 6.0–8.3)

## 2014-02-25 LAB — LIPID PANEL
Cholesterol: 192 mg/dL (ref 0–200)
HDL: 26 mg/dL — ABNORMAL LOW (ref >39)
LDL CALC: 126 mg/dL — AB (ref 0–99)
Total CHOL/HDL Ratio: 7.4 Ratio
Triglycerides: 200 mg/dL — ABNORMAL HIGH (ref <150)
VLDL: 40 mg/dL (ref 0–40)

## 2014-02-25 LAB — TSH: TSH: 1.562 u[IU]/mL (ref 0.350–4.500)

## 2014-02-26 LAB — CBC WITH DIFFERENTIAL/PLATELET
BASOS PCT: 1 % (ref 0–1)
Basophils Absolute: 0.1 10*3/uL (ref 0.0–0.1)
EOS PCT: 4 % (ref 0–5)
Eosinophils Absolute: 0.3 10*3/uL (ref 0.0–0.7)
HEMATOCRIT: 28.5 % — AB (ref 39.0–52.0)
HEMOGLOBIN: 9.8 g/dL — AB (ref 13.0–17.0)
LYMPHS PCT: 36 % (ref 12–46)
Lymphs Abs: 2.3 10*3/uL (ref 0.7–4.0)
MCH: 29.4 pg (ref 26.0–34.0)
MCHC: 34.4 g/dL (ref 30.0–36.0)
MCV: 85.6 fL (ref 78.0–100.0)
MPV: 9.2 fL — AB (ref 9.4–12.4)
Monocytes Absolute: 0.6 10*3/uL (ref 0.1–1.0)
Monocytes Relative: 9 % (ref 3–12)
NEUTROS PCT: 50 % (ref 43–77)
Neutro Abs: 3.3 10*3/uL (ref 1.7–7.7)
Platelets: 238 10*3/uL (ref 150–400)
RBC: 3.33 MIL/uL — AB (ref 4.22–5.81)
RDW: 15.7 % — AB (ref 11.5–15.5)
WBC: 6.5 10*3/uL (ref 4.0–10.5)

## 2014-02-26 LAB — VITAMIN D 25 HYDROXY (VIT D DEFICIENCY, FRACTURES): Vit D, 25-Hydroxy: 19 ng/mL — ABNORMAL LOW (ref 30–100)

## 2014-03-02 ENCOUNTER — Telehealth: Payer: Self-pay | Admitting: Emergency Medicine

## 2014-03-02 ENCOUNTER — Encounter: Payer: Self-pay | Admitting: Internal Medicine

## 2014-03-02 ENCOUNTER — Ambulatory Visit: Payer: Self-pay | Attending: Internal Medicine | Admitting: Internal Medicine

## 2014-03-02 VITALS — BP 152/58 | HR 70 | Temp 97.8°F | Resp 18

## 2014-03-02 DIAGNOSIS — I10 Essential (primary) hypertension: Secondary | ICD-10-CM | POA: Insufficient documentation

## 2014-03-02 DIAGNOSIS — E559 Vitamin D deficiency, unspecified: Secondary | ICD-10-CM | POA: Insufficient documentation

## 2014-03-02 DIAGNOSIS — N289 Disorder of kidney and ureter, unspecified: Secondary | ICD-10-CM | POA: Insufficient documentation

## 2014-03-02 DIAGNOSIS — Z9049 Acquired absence of other specified parts of digestive tract: Secondary | ICD-10-CM | POA: Insufficient documentation

## 2014-03-02 DIAGNOSIS — Z87891 Personal history of nicotine dependence: Secondary | ICD-10-CM | POA: Insufficient documentation

## 2014-03-02 DIAGNOSIS — E781 Pure hyperglyceridemia: Secondary | ICD-10-CM | POA: Insufficient documentation

## 2014-03-02 DIAGNOSIS — D649 Anemia, unspecified: Secondary | ICD-10-CM | POA: Insufficient documentation

## 2014-03-02 LAB — COMPLETE METABOLIC PANEL WITH GFR
ALK PHOS: 66 U/L (ref 39–117)
ALT: 11 U/L (ref 0–53)
AST: 17 U/L (ref 0–37)
Albumin: 3.8 g/dL (ref 3.5–5.2)
BILIRUBIN TOTAL: 0.3 mg/dL (ref 0.2–1.2)
BUN: 26 mg/dL — ABNORMAL HIGH (ref 6–23)
CO2: 18 mEq/L — ABNORMAL LOW (ref 19–32)
Calcium: 9.3 mg/dL (ref 8.4–10.5)
Chloride: 113 mEq/L — ABNORMAL HIGH (ref 96–112)
Creat: 2.38 mg/dL — ABNORMAL HIGH (ref 0.50–1.35)
GFR, Est African American: 33 mL/min — ABNORMAL LOW
GFR, Est Non African American: 28 mL/min — ABNORMAL LOW
Glucose, Bld: 92 mg/dL (ref 70–99)
Potassium: 5 mEq/L (ref 3.5–5.3)
SODIUM: 141 meq/L (ref 135–145)
TOTAL PROTEIN: 6.5 g/dL (ref 6.0–8.3)

## 2014-03-02 MED ORDER — CLONIDINE HCL 0.1 MG PO TABS
0.1000 mg | ORAL_TABLET | Freq: Once | ORAL | Status: AC
  Administered 2014-03-02: 0.1 mg via ORAL

## 2014-03-02 MED ORDER — HYDRALAZINE HCL 25 MG PO TABS: 25.0000 mg | ORAL_TABLET | Freq: Three times a day (TID) | ORAL | Status: DC

## 2014-03-02 NOTE — Telephone Encounter (Signed)
Pt albe to come to clinic today @ 1030am to discuss abnormal lab results

## 2014-03-02 NOTE — Telephone Encounter (Signed)
-----   Message from Lorayne Marek, MD sent at 02/26/2014  3:41 PM EST ----- Please advise patient to make a follow up apt soon  to discuss abnormal blood test

## 2014-03-02 NOTE — Progress Notes (Signed)
MRN: [id number] Name: Jose Garrett  Sex: male Age: 63 y.o. DOB: 06/23/1950  Allergies: Review of patient's allergies indicates no known allergies.  No chief complaint on file.   HPI: Patient is 63 y.o. male who history of hypertension hypertriglyceridemia, comes today for followup and review the lab results, noticed worsening renal function, also noticed low hemoglobin level patient denies any bleeding currently denies any headache dizziness chest and shortness of breath initially his blood pressure was high was given clonidine and repeat manual blood pressure is 152/58, currently patient is taking Norvasc 10 mg as well as metoprolol 100 mg twice a day, his triglycerides are improved currently on Lopid.  No past medical history on file.  Past Surgical History  Procedure Laterality Date  . Cholecystectomy        Medication List       This list is accurate as of: 03/02/14 12:05 PM.  Always use your most recent med list.               amLODipine 10 MG tablet  Commonly known as:  NORVASC  Take 1 tablet (10 mg total) by mouth daily.     gemfibrozil 600 MG tablet  Commonly known as:  LOPID  Take 1 tablet (600 mg total) by mouth 2 (two) times daily before a meal.     hydrALAZINE 25 MG tablet  Commonly known as:  APRESOLINE  Take 1 tablet (25 mg total) by mouth 3 (three) times daily.     metoprolol 100 MG tablet  Commonly known as:  LOPRESSOR  Take 1 tablet (100 mg total) by mouth 2 (two) times daily.        Meds ordered this encounter  Medications  . hydrALAZINE (APRESOLINE) 25 MG tablet    Sig: Take 1 tablet (25 mg total) by mouth 3 (three) times daily.    Dispense:  90 tablet    Refill:  3     There is no immunization history on file for this patient.  Family History  Problem Relation Age of Onset  . Hypertension Mother   . Diabetes Mother   . Heart disease Mother   . Hypertension Father   . Diabetes Father   . Diabetes Sister     History    Substance Use Topics  . Smoking status: Former Smoker -- 40 years  . Smokeless tobacco: Not on file  . Alcohol Use: No    Review of Systems   As noted in HPI  Filed Vitals:   03/02/14 1157  BP: 192/70  Pulse: 70  Temp: 97.8 F (36.6 C)  Resp: 18    Physical Exam  Physical Exam  Constitutional: No distress.  Eyes: EOM are normal. Pupils are equal, round, and reactive to light.  Cardiovascular: Normal rate and regular rhythm.   Pulmonary/Chest: Breath sounds normal. No respiratory distress. He has no wheezes. He has no rales.  Musculoskeletal: He exhibits no edema.    CBC    Component Value Date/Time   WBC 6.5 02/25/2014 0959   RBC 3.33* 02/25/2014 0959   HGB 9.8* 02/25/2014 0959   HCT 28.5* 02/25/2014 0959   PLT 238 02/25/2014 0959   MCV 85.6 02/25/2014 0959   LYMPHSABS 2.3 02/25/2014 0959   MONOABS 0.6 02/25/2014 0959   EOSABS 0.3 02/25/2014 0959   BASOSABS 0.1 02/25/2014 0959    CMP     Component Value Date/Time   NA 144 02/25/2014 0959   K 4.9 02/25/2014 0959  CL 115* 02/25/2014 0959   CO2 18* 02/25/2014 0959   GLUCOSE 95 02/25/2014 0959   BUN 27* 02/25/2014 0959   CREATININE 2.56* 02/25/2014 0959   CALCIUM 9.0 02/25/2014 0959   PROT 6.5 02/25/2014 0959   ALBUMIN 3.7 02/25/2014 0959   AST 17 02/25/2014 0959   ALT 10 02/25/2014 0959   ALKPHOS 62 02/25/2014 0959   BILITOT 0.3 02/25/2014 0959   GFRNONAA 26* 02/25/2014 0959   GFRAA 30* 02/25/2014 0959    Lab Results  Component Value Date/Time   CHOL 192 02/25/2014 09:59 AM    No components found for: HGA1C  Lab Results  Component Value Date/Time   AST 17 02/25/2014 09:59 AM    Assessment and Plan  Renal insufficiency - Plan: Ambulatory referral to Nephrology,will repeat COMPLETE METABOLIC PANEL WITH GFR  Essential hypertension, benign - Plan: still uncontrolled , advise patient for diet modification, continue with Norvasc 10 mg, metoprolol 100 mg twice a day, I have started patient on  hydralazine hydrALAZINE (APRESOLINE) 25 MG tablet, patient will come back in 2 weeks for BP check nurse  visit  Vitamin D deficiency Advise patient to continue with vitamin D supplement.   Hypertriglyceridemia Triglycerides are improved, continue with  Lopid, and low-fat diet  Anemia, unspecified anemia type - Plan: patient denies any bleeding, ? Lab error , will repeat CBC and also check anemia panel Anemia panel, CBC with Differential     Return in about 3 months (around 06/01/2014) for hypertension, BP check in 2 weeks/Nurse Visit.  Lorayne Marek, MD

## 2014-03-02 NOTE — Progress Notes (Signed)
Patient here to discuss his lab results with her primary doctor

## 2014-03-03 LAB — CBC WITH DIFFERENTIAL/PLATELET
BASOS ABS: 0.1 10*3/uL (ref 0.0–0.1)
BASOS PCT: 1 % (ref 0–1)
EOS ABS: 0.3 10*3/uL (ref 0.0–0.7)
Eosinophils Relative: 4 % (ref 0–5)
HEMATOCRIT: 29.6 % — AB (ref 39.0–52.0)
Hemoglobin: 10.4 g/dL — ABNORMAL LOW (ref 13.0–17.0)
Lymphocytes Relative: 31 % (ref 12–46)
Lymphs Abs: 2.5 10*3/uL (ref 0.7–4.0)
MCH: 30.3 pg (ref 26.0–34.0)
MCHC: 35.1 g/dL (ref 30.0–36.0)
MCV: 86.3 fL (ref 78.0–100.0)
MPV: 8.8 fL — ABNORMAL LOW (ref 9.4–12.4)
Monocytes Absolute: 0.8 10*3/uL (ref 0.1–1.0)
Monocytes Relative: 10 % (ref 3–12)
Neutro Abs: 4.4 10*3/uL (ref 1.7–7.7)
Neutrophils Relative %: 54 % (ref 43–77)
Platelets: 273 10*3/uL (ref 150–400)
RBC: 3.43 MIL/uL — ABNORMAL LOW (ref 4.22–5.81)
RDW: 16 % — AB (ref 11.5–15.5)
WBC: 8.1 10*3/uL (ref 4.0–10.5)

## 2014-03-03 LAB — ANEMIA PANEL
%SAT: 36 % (ref 20–55)
ABS RETIC: 82.3 10*3/uL (ref 19.0–186.0)
Ferritin: 210 ng/mL (ref 22–322)
Folate: 18.5 ng/mL
IRON: 129 ug/dL (ref 42–165)
RBC.: 3.43 MIL/uL — AB (ref 4.22–5.81)
RETIC CT PCT: 2.4 % — AB (ref 0.4–2.3)
TIBC: 362 ug/dL (ref 215–435)
UIBC: 233 ug/dL (ref 125–400)
Vitamin B-12: 410 pg/mL (ref 211–911)

## 2014-03-15 ENCOUNTER — Telehealth: Payer: Self-pay | Admitting: *Deleted

## 2014-03-15 NOTE — Telephone Encounter (Signed)
Pt aware of lab results, stated is taking blood pressure medication as prescribed

## 2014-03-15 NOTE — Telephone Encounter (Signed)
-----   Message from Lorayne Marek, MD sent at 03/09/2014  4:53 PM EST ----- Call and let the patient know that his hemoglobin Golden Pop is better than compared to last reading, patient has already been referred to GI for screening colonoscopy. Advise patient to schedule appointment with GI Renal function is slightly better, he has already been referred to nephrology for further evaluation, needs to take his blood pressure medications regularly

## 2014-03-18 ENCOUNTER — Ambulatory Visit: Payer: Self-pay | Attending: Internal Medicine | Admitting: *Deleted

## 2014-03-18 VITALS — BP 186/60 | HR 65 | Temp 98.0°F | Resp 16 | Wt 200.4 lb

## 2014-03-18 DIAGNOSIS — I10 Essential (primary) hypertension: Secondary | ICD-10-CM | POA: Insufficient documentation

## 2014-03-18 DIAGNOSIS — E781 Pure hyperglyceridemia: Secondary | ICD-10-CM | POA: Insufficient documentation

## 2014-03-18 MED ORDER — HYDRALAZINE HCL 50 MG PO TABS
50.0000 mg | ORAL_TABLET | Freq: Three times a day (TID) | ORAL | Status: DC
Start: 2014-03-18 — End: 2014-04-19

## 2014-03-18 NOTE — Progress Notes (Signed)
Patient presents for BP check Med list reviewed; states taking all meds as directed Took hydralazine and metoprolol this AM. States will take amlodipine at lunch time Reports swelling in both ankles over last 7-10 days Patient reports cooking with salt, eating canned vegetables and canned soups and frozen foods with sauces Discussed need for low sodium diet and Mrs. Dash as alternative to salt  BP 186/60 left arm manually with adult cuff P 65 R 16   T  98 oral SPO2  100%  Wt 200.4lb  2+ pitting edema noted ankles bilaterally Patient advised to elevate lower extremities while sitting at home Patient given literature on DASH Eating Plan  Per PCP: Increase hydralazine to 50 mg tid  Follow low sodium diet Return for nurse visit for BP check in 2 weeks  Patient advised to call for med refills at least 7 days before running out so as not to go without. Patient aware that he is to f/u with PCP 3 months from last visit (Due 06/01/14)

## 2014-03-18 NOTE — Patient Instructions (Signed)
DASH Eating Plan °DASH stands for "Dietary Approaches to Stop Hypertension." The DASH eating plan is a healthy eating plan that has been shown to reduce high blood pressure (hypertension). Additional health benefits may include reducing the risk of type 2 diabetes mellitus, heart disease, and stroke. The DASH eating plan may also help with weight loss. °WHAT DO I NEED TO KNOW ABOUT THE DASH EATING PLAN? °For the DASH eating plan, you will follow these general guidelines: °· Choose foods with a percent daily value for sodium of less than 5% (as listed on the food label). °· Use salt-free seasonings or herbs instead of table salt or sea salt. °· Check with your health care provider or pharmacist before using salt substitutes. °· Eat lower-sodium products, often labeled as "lower sodium" or "no salt added." °· Eat fresh foods. °· Eat more vegetables, fruits, and low-fat dairy products. °· Choose whole grains. Look for the word "whole" as the first word in the ingredient list. °· Choose fish and skinless chicken or turkey more often than red meat. Limit fish, poultry, and meat to 6 oz (170 g) each day. °· Limit sweets, desserts, sugars, and sugary drinks. °· Choose heart-healthy fats. °· Limit cheese to 1 oz (28 g) per day. °· Eat more home-cooked food and less restaurant, buffet, and fast food. °· Limit fried foods. °· Cook foods using methods other than frying. °· Limit canned vegetables. If you do use them, rinse them well to decrease the sodium. °· When eating at a restaurant, ask that your food be prepared with less salt, or no salt if possible. °WHAT FOODS CAN I EAT? °Seek help from a dietitian for individual calorie needs. °Grains °Whole grain or whole wheat bread. Brown rice. Whole grain or whole wheat pasta. Quinoa, bulgur, and whole grain cereals. Low-sodium cereals. Corn or whole wheat flour tortillas. Whole grain cornbread. Whole grain crackers. Low-sodium crackers. °Vegetables °Fresh or frozen vegetables  (raw, steamed, roasted, or grilled). Low-sodium or reduced-sodium tomato and vegetable juices. Low-sodium or reduced-sodium tomato sauce and paste. Low-sodium or reduced-sodium canned vegetables.  °Fruits °All fresh, canned (in natural juice), or frozen fruits. °Meat and Other Protein Products °Ground beef (85% or leaner), grass-fed beef, or beef trimmed of fat. Skinless chicken or turkey. Ground chicken or turkey. Pork trimmed of fat. All fish and seafood. Eggs. Dried beans, peas, or lentils. Unsalted nuts and seeds. Unsalted canned beans. °Dairy °Low-fat dairy products, such as skim or 1% milk, 2% or reduced-fat cheeses, low-fat ricotta or cottage cheese, or plain low-fat yogurt. Low-sodium or reduced-sodium cheeses. °Fats and Oils °Tub margarines without trans fats. Light or reduced-fat mayonnaise and salad dressings (reduced sodium). Avocado. Safflower, olive, or canola oils. Natural peanut or almond butter. °Other °Unsalted popcorn and pretzels. °The items listed above may not be a complete list of recommended foods or beverages. Contact your dietitian for more options. °WHAT FOODS ARE NOT RECOMMENDED? °Grains °White bread. White pasta. White rice. Refined cornbread. Bagels and croissants. Crackers that contain trans fat. °Vegetables °Creamed or fried vegetables. Vegetables in a cheese sauce. Regular canned vegetables. Regular canned tomato sauce and paste. Regular tomato and vegetable juices. °Fruits °Dried fruits. Canned fruit in light or heavy syrup. Fruit juice. °Meat and Other Protein Products °Fatty cuts of meat. Ribs, chicken wings, bacon, sausage, bologna, salami, chitterlings, fatback, hot dogs, bratwurst, and packaged luncheon meats. Salted nuts and seeds. Canned beans with salt. °Dairy °Whole or 2% milk, cream, half-and-half, and cream cheese. Whole-fat or sweetened yogurt. Full-fat   cheeses or blue cheese. Nondairy creamers and whipped toppings. Processed cheese, cheese spreads, or cheese  curds. °Condiments °Onion and garlic salt, seasoned salt, table salt, and sea salt. Canned and packaged gravies. Worcestershire sauce. Tartar sauce. Barbecue sauce. Teriyaki sauce. Soy sauce, including reduced sodium. Steak sauce. Fish sauce. Oyster sauce. Cocktail sauce. Horseradish. Ketchup and mustard. Meat flavorings and tenderizers. Bouillon cubes. Hot sauce. Tabasco sauce. Marinades. Taco seasonings. Relishes. °Fats and Oils °Butter, stick margarine, lard, shortening, ghee, and bacon fat. Coconut, palm kernel, or palm oils. Regular salad dressings. °Other °Pickles and olives. Salted popcorn and pretzels. °The items listed above may not be a complete list of foods and beverages to avoid. Contact your dietitian for more information. °WHERE CAN I FIND MORE INFORMATION? °National Heart, Lung, and Blood Institute: www.nhlbi.nih.gov/health/health-topics/topics/dash/ °Document Released: 02/15/2011 Document Revised: 07/13/2013 Document Reviewed: 12/31/2012 °Exit[employer]® Patient Information ©2015 [employer]. This information is not intended to replace advice given to you by your health care provider. Make sure you discuss any questions you have with your health care provider. ° °

## 2014-03-29 ENCOUNTER — Ambulatory Visit: Payer: Self-pay | Attending: Internal Medicine | Admitting: Physician Assistant

## 2014-03-29 ENCOUNTER — Telehealth: Payer: Self-pay | Admitting: Emergency Medicine

## 2014-03-29 ENCOUNTER — Telehealth: Payer: Self-pay | Admitting: Internal Medicine

## 2014-03-29 VITALS — BP 146/50 | HR 56 | Temp 98.0°F | Resp 16

## 2014-03-29 DIAGNOSIS — R609 Edema, unspecified: Secondary | ICD-10-CM | POA: Insufficient documentation

## 2014-03-29 DIAGNOSIS — I10 Essential (primary) hypertension: Secondary | ICD-10-CM | POA: Insufficient documentation

## 2014-03-29 MED ORDER — CLONIDINE HCL 0.1 MG PO TABS
0.1000 mg | ORAL_TABLET | Freq: Once | ORAL | Status: AC
  Administered 2014-03-29: 0.1 mg via ORAL

## 2014-03-29 NOTE — Patient Instructions (Signed)
Keep a log of your blood pressure twice daily Keep feet elevated when sitting Compression stockings-you can get from a uniform shop or medical supply company Increase your dose of Hydralazine back to 50 mg TID Continue Metoprolol and Amlodopine as prescribed Appt with Advani, MD in 2 weeks-we will schedule

## 2014-03-29 NOTE — Telephone Encounter (Signed)
Responded to pt e-mail regarding blood work f/u. Blood work completed

## 2014-03-29 NOTE — Telephone Encounter (Signed)
Patient called stating he has been having swelling in his feet and he believes its fluid build up, patient would like to speak to nurse for advice. Please f/u with pt.

## 2014-03-29 NOTE — Progress Notes (Signed)
Chief Complaint:Swelling in legs and feet  Subjective: This is a 64 yo male with known hypertension and kidney dz who presents acutely with swelling in his lower ext. Started 2 months ago and progressively getting worse. No pain, numbness or tingling in legs/feet. Had a recent echo with nl LV function. No CP or SHOB.   He also has had several nurse visits for BP checks. Today SBP 199 with HR of 70. Increased to [id number] systolic ally on my check. Clonidine 0.2 ordered. Admits to self decreasing Hydralazine because he thought it was causing some side effects> muscle spasms and chills.    ROS: Denies fever, chills, headache, earache, chest pain or shortness of breath.  Denies palpitaions or change in weight.    Objective:  Filed Vitals:   03/29/14 1104  BP: 197/77  Pulse: 70  Temp: 98 F (36.7 C)  TempSrc: Oral  Resp: 16  SpO2: 97%    Physical Exam:  General: in no acute distress. HEENT: no pallor, no icterus, moist oral mucosa, no JVD, no lymphadenopathy Heart: Normal  s1 &s2  Regular rate and rhythm, without murmurs, rubs, gallops. Lungs: Clear to auscultation bilaterally. Abdomen: Soft, nontender, nondistended, positive bowel sounds. Extremities: No clubbing cyanosis or edema with positive pedal pulses. Neuro: Alert, awake, oriented x3, nonfocal.  Pertinent Lab Results:NONE   Medications: amLODipine (NORVASC) 10 MG tablet gemfibrozil (LOPID) 600 MG tablet hydrALAZINE (APRESOLINE) 50 MG tablet metoprolol (LOPRESSOR) 100 MG tablet   Assessment: 1. Lower Ext Edema 2. Uncontrolled HTN  Plan: Education on salt restriction and other dietary measures TED hose Leg elevation Considered Lasix but CR will not allow Appt w/ Nephro at [facility] sooner than later  Clonidine 0.2 mg * 1 today Cont home meds and encouraged to go back to Hydralazine 50 mg TID 2 week f/u with Advani, MD  Follow up:2 weeks  The patient was given clear instructions to go to ER or return to medical center  if symptoms don't improve, worsen or new problems develop. The patient verbalized understanding. The patient was told to call to get lab results if they haven't heard anything in the next week.   This note has been created with Surveyor, quantity. Any transcriptional errors are unintentional.   Zettie Pho, PA-C 03/29/2014, 11:44 AM   2

## 2014-03-29 NOTE — Progress Notes (Signed)
Pt is here today b/c his legs are swelling for over a week.

## 2014-04-14 ENCOUNTER — Other Ambulatory Visit: Payer: Self-pay | Admitting: Internal Medicine

## 2014-04-15 ENCOUNTER — Telehealth: Payer: Self-pay | Admitting: Internal Medicine

## 2014-04-15 ENCOUNTER — Telehealth: Payer: Self-pay

## 2014-04-15 MED ORDER — GEMFIBROZIL 600 MG PO TABS: 600.0000 mg | ORAL_TABLET | Freq: Two times a day (BID) | ORAL | Status: DC

## 2014-04-15 NOTE — Telephone Encounter (Signed)
Patient is aware his prescription has been refilled and can pick It up at the pharmacy

## 2014-04-15 NOTE — Telephone Encounter (Signed)
Patient called to request a med refill for gemfibrozil (LOPID) 600 MG tablet. Please f/u with pt.

## 2014-04-19 ENCOUNTER — Ambulatory Visit: Payer: Self-pay | Attending: Internal Medicine | Admitting: *Deleted

## 2014-04-19 VITALS — BP 183/71 | HR 70 | Temp 98.1°F | Resp 18 | Wt 202.4 lb

## 2014-04-19 DIAGNOSIS — E781 Pure hyperglyceridemia: Secondary | ICD-10-CM | POA: Insufficient documentation

## 2014-04-19 DIAGNOSIS — R0609 Other forms of dyspnea: Secondary | ICD-10-CM | POA: Insufficient documentation

## 2014-04-19 DIAGNOSIS — R5383 Other fatigue: Secondary | ICD-10-CM | POA: Insufficient documentation

## 2014-04-19 DIAGNOSIS — I10 Essential (primary) hypertension: Secondary | ICD-10-CM | POA: Insufficient documentation

## 2014-04-19 MED ORDER — HYDRALAZINE HCL 50 MG PO TABS: 50.0000 mg | ORAL_TABLET | Freq: Four times a day (QID) | ORAL | Status: DC

## 2014-04-19 NOTE — Progress Notes (Signed)
Patient presents for BP check Med list reviewed; states taking all meds as directed Discussed need for low sodium diet and using Mrs. Dash as alternative to salt. Patient states he is doing better making low sodium choices. C/o SHOB with exertion ie. Bringing trash receptacles back, but not with normal walking States bilateral LE edema remains mostly the same; states he has made an effort to elevate LE while sitting at home but doesn't do consistetly Patient denies headaches, blurred vision, chest pain or presseure States he has been having family issues and may have to move soon Has not yet made appt with Renal. Stressed again importance of making appt with renal specialist due to elevated BP,  bilateral LE edema and SHOB  BP 183/71 P 70 R 18  T  98.1 oral SPO2  98%  Wt 201.6lb (2 lb increase since 03/18/14)  1+ pitting edema noted at both ankles Lungs clear to auscultation with good breath sounds noted throughout  Per PCP: Increase hydralazine to qid Make appt with Renal F/u with PCP in 2 weeks  Patient advised to call for med refills at least 7 days before running out so as not to go without.

## 2014-05-21 ENCOUNTER — Telehealth: Payer: Self-pay

## 2014-05-21 ENCOUNTER — Other Ambulatory Visit: Payer: Self-pay | Admitting: Internal Medicine

## 2014-05-21 DIAGNOSIS — I10 Essential (primary) hypertension: Secondary | ICD-10-CM

## 2014-05-21 MED ORDER — METOPROLOL TARTRATE 100 MG PO TABS: 100.0000 mg | ORAL_TABLET | Freq: Two times a day (BID) | ORAL | Status: DC

## 2014-05-21 MED ORDER — AMLODIPINE BESYLATE 10 MG PO TABS: 10.0000 mg | ORAL_TABLET | Freq: Every day | ORAL | Status: DC

## 2014-05-21 NOTE — Telephone Encounter (Signed)
Patient called requesting refills on his blood pressure medication Prescription sent to the pharmacy

## 2014-06-21 ENCOUNTER — Other Ambulatory Visit: Payer: Self-pay | Admitting: Internal Medicine

## 2014-06-23 ENCOUNTER — Other Ambulatory Visit: Payer: Self-pay

## 2014-06-23 MED ORDER — GEMFIBROZIL 600 MG PO TABS: 600.0000 mg | ORAL_TABLET | Freq: Two times a day (BID) | ORAL | Status: DC

## 2014-06-30 ENCOUNTER — Ambulatory Visit: Payer: Self-pay | Attending: Internal Medicine | Admitting: Internal Medicine

## 2014-06-30 ENCOUNTER — Encounter: Payer: Self-pay | Admitting: Internal Medicine

## 2014-06-30 VITALS — BP 180/70 | HR 64 | Temp 98.3°F | Resp 16 | Wt 187.6 lb

## 2014-06-30 DIAGNOSIS — E785 Hyperlipidemia, unspecified: Secondary | ICD-10-CM | POA: Insufficient documentation

## 2014-06-30 DIAGNOSIS — I129 Hypertensive chronic kidney disease with stage 1 through stage 4 chronic kidney disease, or unspecified chronic kidney disease: Secondary | ICD-10-CM | POA: Insufficient documentation

## 2014-06-30 DIAGNOSIS — N189 Chronic kidney disease, unspecified: Secondary | ICD-10-CM | POA: Insufficient documentation

## 2014-06-30 DIAGNOSIS — I10 Essential (primary) hypertension: Secondary | ICD-10-CM

## 2014-06-30 DIAGNOSIS — N289 Disorder of kidney and ureter, unspecified: Secondary | ICD-10-CM

## 2014-06-30 DIAGNOSIS — R609 Edema, unspecified: Secondary | ICD-10-CM

## 2014-06-30 DIAGNOSIS — Z87891 Personal history of nicotine dependence: Secondary | ICD-10-CM | POA: Insufficient documentation

## 2014-06-30 DIAGNOSIS — E781 Pure hyperglyceridemia: Secondary | ICD-10-CM | POA: Insufficient documentation

## 2014-06-30 DIAGNOSIS — Z9049 Acquired absence of other specified parts of digestive tract: Secondary | ICD-10-CM | POA: Insufficient documentation

## 2014-06-30 LAB — COMPLETE METABOLIC PANEL WITH GFR
ALT: 11 U/L (ref 0–53)
AST: 18 U/L (ref 0–37)
Albumin: 4 g/dL (ref 3.5–5.2)
Alkaline Phosphatase: 71 U/L (ref 39–117)
BUN: 30 mg/dL — ABNORMAL HIGH (ref 6–23)
CO2: 18 mEq/L — ABNORMAL LOW (ref 19–32)
Calcium: 9.3 mg/dL (ref 8.4–10.5)
Chloride: 109 mEq/L (ref 96–112)
Creat: 2.71 mg/dL — ABNORMAL HIGH (ref 0.50–1.35)
GFR, EST AFRICAN AMERICAN: 28 mL/min — AB
GFR, EST NON AFRICAN AMERICAN: 24 mL/min — AB
Glucose, Bld: 94 mg/dL (ref 70–99)
Potassium: 4.2 mEq/L (ref 3.5–5.3)
SODIUM: 141 meq/L (ref 135–145)
Total Bilirubin: 0.3 mg/dL (ref 0.2–1.2)
Total Protein: 6.6 g/dL (ref 6.0–8.3)

## 2014-06-30 NOTE — Patient Instructions (Signed)
DASH Eating Plan °DASH stands for "Dietary Approaches to Stop Hypertension." The DASH eating plan is a healthy eating plan that has been shown to reduce high blood pressure (hypertension). Additional health benefits may include reducing the risk of type 2 diabetes mellitus, heart disease, and stroke. The DASH eating plan may also help with weight loss. °WHAT DO I NEED TO KNOW ABOUT THE DASH EATING PLAN? °For the DASH eating plan, you will follow these general guidelines: °· Choose foods with a percent daily value for sodium of less than 5% (as listed on the food label). °· Use salt-free seasonings or herbs instead of table salt or sea salt. °· Check with your health care provider or pharmacist before using salt substitutes. °· Eat lower-sodium products, often labeled as "lower sodium" or "no salt added." °· Eat fresh foods. °· Eat more vegetables, fruits, and low-fat dairy products. °· Choose whole grains. Look for the word "whole" as the first word in the ingredient list. °· Choose fish and skinless chicken or turkey more often than red meat. Limit fish, poultry, and meat to 6 oz (170 g) each day. °· Limit sweets, desserts, sugars, and sugary drinks. °· Choose heart-healthy fats. °· Limit cheese to 1 oz (28 g) per day. °· Eat more home-cooked food and less restaurant, buffet, and fast food. °· Limit fried foods. °· Cook foods using methods other than frying. °· Limit canned vegetables. If you do use them, rinse them well to decrease the sodium. °· When eating at a restaurant, ask that your food be prepared with less salt, or no salt if possible. °WHAT FOODS CAN I EAT? °Seek help from a dietitian for individual calorie needs. °Grains °Whole grain or whole wheat bread. Brown rice. Whole grain or whole wheat pasta. Quinoa, bulgur, and whole grain cereals. Low-sodium cereals. Corn or whole wheat flour tortillas. Whole grain cornbread. Whole grain crackers. Low-sodium crackers. °Vegetables °Fresh or frozen vegetables  (raw, steamed, roasted, or grilled). Low-sodium or reduced-sodium tomato and vegetable juices. Low-sodium or reduced-sodium tomato sauce and paste. Low-sodium or reduced-sodium canned vegetables.  °Fruits °All fresh, canned (in natural juice), or frozen fruits. °Meat and Other Protein Products °Ground beef (85% or leaner), grass-fed beef, or beef trimmed of fat. Skinless chicken or turkey. Ground chicken or turkey. Pork trimmed of fat. All fish and seafood. Eggs. Dried beans, peas, or lentils. Unsalted nuts and seeds. Unsalted canned beans. °Dairy °Low-fat dairy products, such as skim or 1% milk, 2% or reduced-fat cheeses, low-fat ricotta or cottage cheese, or plain low-fat yogurt. Low-sodium or reduced-sodium cheeses. °Fats and Oils °Tub margarines without trans fats. Light or reduced-fat mayonnaise and salad dressings (reduced sodium). Avocado. Safflower, olive, or canola oils. Natural peanut or almond butter. °Other °Unsalted popcorn and pretzels. °The items listed above may not be a complete list of recommended foods or beverages. Contact your dietitian for more options. °WHAT FOODS ARE NOT RECOMMENDED? °Grains °White bread. White pasta. White rice. Refined cornbread. Bagels and croissants. Crackers that contain trans fat. °Vegetables °Creamed or fried vegetables. Vegetables in a cheese sauce. Regular canned vegetables. Regular canned tomato sauce and paste. Regular tomato and vegetable juices. °Fruits °Dried fruits. Canned fruit in light or heavy syrup. Fruit juice. °Meat and Other Protein Products °Fatty cuts of meat. Ribs, chicken wings, bacon, sausage, bologna, salami, chitterlings, fatback, hot dogs, bratwurst, and packaged luncheon meats. Salted nuts and seeds. Canned beans with salt. °Dairy °Whole or 2% milk, cream, half-and-half, and cream cheese. Whole-fat or sweetened yogurt. Full-fat   cheeses or blue cheese. Nondairy creamers and whipped toppings. Processed cheese, cheese spreads, or cheese  curds. °Condiments °Onion and garlic salt, seasoned salt, table salt, and sea salt. Canned and packaged gravies. Worcestershire sauce. Tartar sauce. Barbecue sauce. Teriyaki sauce. Soy sauce, including reduced sodium. Steak sauce. Fish sauce. Oyster sauce. Cocktail sauce. Horseradish. Ketchup and mustard. Meat flavorings and tenderizers. Bouillon cubes. Hot sauce. Tabasco sauce. Marinades. Taco seasonings. Relishes. °Fats and Oils °Butter, stick margarine, lard, shortening, ghee, and bacon fat. Coconut, palm kernel, or palm oils. Regular salad dressings. °Other °Pickles and olives. Salted popcorn and pretzels. °The items listed above may not be a complete list of foods and beverages to avoid. Contact your dietitian for more information. °WHERE CAN I FIND MORE INFORMATION? °National Heart, Lung, and Blood Institute: www.nhlbi.nih.gov/health/health-topics/topics/dash/ °Document Released: 02/15/2011 Document Revised: 07/13/2013 Document Reviewed: 12/31/2012 °Exit[employer]® Patient Information ©2015 [employer]. This information is not intended to replace advice given to you by your health care provider. Make sure you discuss any questions you have with your health care provider. ° °

## 2014-06-30 NOTE — Progress Notes (Signed)
MRN: [id number] Name: Jose Garrett  Sex: male Age: 64 y.o. DOB: [birth year]  Allergies: Review of patient's allergies indicates no known allergies.  Chief Complaint  Patient presents with  . Follow-up    HPI: Patient is 64 y.o. male who has history of hypertension, hypertriglyceridemia, chronic renal insufficiency lower extremity edema , as per patient he was seen by nephrologist and was started on Lasix and his amlodipine dose was reduced to 5 mg, also his hydralazine was reduced to twice a day , as per patient since yesterday Lasix his edema is improved and patient has lost a few pounds. Patient currently denies any headache dizziness chest and shortness of breath, as per patient he feels much better, his blood pressure is still elevated.  Past Medical History  Diagnosis Date  . Hypertension   . Hyperlipidemia     Past Surgical History  Procedure Laterality Date  . Cholecystectomy        Medication List       This list is accurate as of: 06/30/14  3:44 PM.  Always use your most recent med list.               amLODipine 5 MG tablet  Commonly known as:  NORVASC  Take 5 mg by mouth daily.     furosemide 40 MG tablet  Commonly known as:  LASIX  Take 40 mg by mouth 2 (two) times daily.     gemfibrozil 600 MG tablet  Commonly known as:  LOPID  Take 1 tablet (600 mg total) by mouth 2 (two) times daily before a meal.     hydrALAZINE 50 MG tablet  Commonly known as:  APRESOLINE  Take 1 tablet (50 mg total) by mouth 4 (four) times daily.     metoprolol 100 MG tablet  Commonly known as:  LOPRESSOR  Take 1 tablet (100 mg total) by mouth 2 (two) times daily.        Meds ordered this encounter  Medications  . amLODipine (NORVASC) 5 MG tablet    Sig: Take 5 mg by mouth daily.  . furosemide (LASIX) 40 MG tablet    Sig: Take 40 mg by mouth 2 (two) times daily.     There is no immunization history on file for this patient.  Family History  Problem Relation  Age of Onset  . Hypertension Mother   . Diabetes Mother   . Heart disease Mother   . Hypertension Father   . Diabetes Father   . Diabetes Sister     History  Substance Use Topics  . Smoking status: Former Smoker -- 40 years  . Smokeless tobacco: Former Systems developer    Quit date: 03/19/2011  . Alcohol Use: No    Review of Systems   As noted in HPI  Filed Vitals:   06/30/14 1524  BP: 180/70  Pulse:   Temp:   Resp:     Physical Exam  Physical Exam  Constitutional: No distress.  Eyes: EOM are normal. Pupils are equal, round, and reactive to light.  Neck: Neck supple.  Cardiovascular: Normal rate and regular rhythm.   Pulmonary/Chest: Breath sounds normal. No respiratory distress. He has no wheezes. He has no rales.  Musculoskeletal:  Trace pedal edema    CBC    Component Value Date/Time   WBC 8.1 03/02/2014 1227   RBC 3.43* 03/02/2014 1227   RBC 3.43* 03/02/2014 1227   HGB 10.4* 03/02/2014 1227   HCT 29.6* 03/02/2014 1227  PLT 273 03/02/2014 1227   MCV 86.3 03/02/2014 1227   LYMPHSABS 2.5 03/02/2014 1227   MONOABS 0.8 03/02/2014 1227   EOSABS 0.3 03/02/2014 1227   BASOSABS 0.1 03/02/2014 1227    CMP     Component Value Date/Time   NA 141 03/02/2014 1227   K 5.0 03/02/2014 1227   CL 113* 03/02/2014 1227   CO2 18* 03/02/2014 1227   GLUCOSE 92 03/02/2014 1227   BUN 26* 03/02/2014 1227   CREATININE 2.38* 03/02/2014 1227   CALCIUM 9.3 03/02/2014 1227   PROT 6.5 03/02/2014 1227   ALBUMIN 3.8 03/02/2014 1227   AST 17 03/02/2014 1227   ALT 11 03/02/2014 1227   ALKPHOS 66 03/02/2014 1227   BILITOT 0.3 03/02/2014 1227   GFRNONAA 28* 03/02/2014 1227   GFRAA 33* 03/02/2014 1227    Lab Results  Component Value Date/Time   CHOL 192 02/25/2014 09:59 AM    No results found for: HGBA1C  Lab Results  Component Value Date/Time   AST 17 03/02/2014 12:27 PM    Assessment and Plan  Essential hypertension, benign - Plan:currently patient is on Lasix 40 mg  twice a day, metoprolol tartrate milligrams 3 times a day, hydralazine 50 mg twice a day, his blood pressure is still uncontrolled, I have advised patient to increase the dose of amlodipine to 10 mg daily, will repeat blood chemistry  COMPLETE METABOLIC PANEL WITH GFR  Edema Patient is currently on Lasix and his edema is improved  Renal insufficiency - Plan:Patient was seen by nephrologist, will repeat blood chemistry since he is on Lasix COMPLETE METABOLIC PANEL WITH GFR    Return in about 3 months (around 09/29/2014) for hypertension, BP check in 2 weeks/Nurse Visit.   This note has been created with Surveyor, quantity. Any transcriptional errors are unintentional.    Lorayne Marek, MD

## 2014-06-30 NOTE — Progress Notes (Signed)
Patient here for follow up on his hypertension Patient states recently saw the kidney doctor and some changes were made to his Medications Amlodipine is now 5mg  daily and apresoline is 50mg  BID

## 2014-07-01 ENCOUNTER — Telehealth: Payer: Self-pay

## 2014-07-01 NOTE — Telephone Encounter (Signed)
-----   Message from Lorayne Marek, MD sent at 07/01/2014 11:15 AM EDT ----- Blood work reviewed noticed worsening renal function, blood pressure needs better control advised patient to take amlodipine 10 mg daily and advise patient to make a follow up appointment with his nephrologist.

## 2014-07-01 NOTE — Telephone Encounter (Signed)
Patient is aware of his lab results and will make a follow up \ Appointment with his nephrologist

## 2014-07-02 ENCOUNTER — Emergency Department (HOSPITAL_COMMUNITY): Payer: Self-pay

## 2014-07-02 ENCOUNTER — Emergency Department (HOSPITAL_COMMUNITY)
Admission: EM | Admit: 2014-07-02 | Discharge: 2014-07-02 | Disposition: A | Discharge status: Home / Self Care | Payer: Self-pay | Attending: Emergency Medicine | Admitting: Emergency Medicine

## 2014-07-02 ENCOUNTER — Encounter (HOSPITAL_COMMUNITY): Payer: Self-pay | Admitting: Emergency Medicine

## 2014-07-02 DIAGNOSIS — S0001XA Abrasion of scalp, initial encounter: Secondary | ICD-10-CM | POA: Insufficient documentation

## 2014-07-02 DIAGNOSIS — Z87891 Personal history of nicotine dependence: Secondary | ICD-10-CM | POA: Insufficient documentation

## 2014-07-02 DIAGNOSIS — S3992XA Unspecified injury of lower back, initial encounter: Secondary | ICD-10-CM | POA: Insufficient documentation

## 2014-07-02 DIAGNOSIS — Z79899 Other long term (current) drug therapy: Secondary | ICD-10-CM | POA: Insufficient documentation

## 2014-07-02 DIAGNOSIS — Y9389 Activity, other specified: Secondary | ICD-10-CM | POA: Insufficient documentation

## 2014-07-02 DIAGNOSIS — E785 Hyperlipidemia, unspecified: Secondary | ICD-10-CM | POA: Insufficient documentation

## 2014-07-02 DIAGNOSIS — I10 Essential (primary) hypertension: Secondary | ICD-10-CM | POA: Insufficient documentation

## 2014-07-02 DIAGNOSIS — Y998 Other external cause status: Secondary | ICD-10-CM | POA: Insufficient documentation

## 2014-07-02 DIAGNOSIS — S8992XA Unspecified injury of left lower leg, initial encounter: Secondary | ICD-10-CM | POA: Insufficient documentation

## 2014-07-02 DIAGNOSIS — Z87448 Personal history of other diseases of urinary system: Secondary | ICD-10-CM | POA: Insufficient documentation

## 2014-07-02 DIAGNOSIS — Y92410 Unspecified street and highway as the place of occurrence of the external cause: Secondary | ICD-10-CM | POA: Insufficient documentation

## 2014-07-02 MED ORDER — MORPHINE SULFATE 4 MG/ML IJ SOLN
4.0000 mg | Freq: Once | INTRAMUSCULAR | Status: AC
  Administered 2014-07-02: 4 mg via INTRAVENOUS
  Filled 2014-07-02: qty 1

## 2014-07-02 MED ORDER — NAPROXEN 500 MG PO TABS: 500.0000 mg | ORAL_TABLET | Freq: Two times a day (BID) | ORAL | Status: DC

## 2014-07-02 MED ORDER — HYDROCODONE-ACETAMINOPHEN 5-325 MG PO TABS: 1.0000 | ORAL_TABLET | ORAL | Status: DC | PRN

## 2014-07-02 MED ORDER — ONDANSETRON HCL 4 MG/2ML IJ SOLN
4.0000 mg | INTRAMUSCULAR | Status: AC
  Administered 2014-07-02: 4 mg via INTRAVENOUS
  Filled 2014-07-02: qty 2
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
## 2014-07-02 NOTE — ED Notes (Signed)
Patient transported to X-ray 

## 2014-07-02 NOTE — Discharge Instructions (Signed)
Please follow the directions provided. Be sure to follow-up with your primary care doctor to ensure you're getting better. Please take naproxen twice a day to help with pain and inflammation. You may take the Vicodin for pain not relieved by the naproxen. Don't hesitate to return for any new, worsening, or concerning symptoms.  WHEN SHOULD I SEEK IMMEDIATE MEDICAL CARE?  You should get help right away if:  You have confusion or drowsiness.  You feel sick to your stomach (nauseous) or have continued, forceful vomiting.  You have dizziness or unsteadiness that is getting worse.  You have severe, continued headaches not relieved by medicine. Only take over-the-counter or prescription medicines for pain, fever, or discomfort as directed by your health care provider.  You do not have normal function of the arms or legs or are unable to walk.  You notice changes in the black spots in the center of the colored part of your eye (pupil).  You have a clear or bloody fluid coming from your nose or ears.  You have a loss of vision. During the next 24 hours after the injury, you must stay with someone who can watch you for the warning signs. This person should contact local emergency services (911 in the [place] if you have seizures, you become unconscious, or you are unable to wake up.
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
## 2014-07-15 ENCOUNTER — Ambulatory Visit: Payer: Self-pay | Attending: Internal Medicine

## 2014-07-15 VITALS — BP 172/73 | HR 60

## 2014-07-15 DIAGNOSIS — I10 Essential (primary) hypertension: Secondary | ICD-10-CM | POA: Insufficient documentation

## 2014-07-15 NOTE — Progress Notes (Signed)
Patient seen today for blood pressure check.  Blood pressure at beginning of visit was 192/76.  Patient said he rushed to get here and had not taken his medication yet today.  He says he can feel when his blood pressure changes.  After resting patients blood pressure went down to 172/73.  Patient says he is trying to be compliant, but when he is busy running around he forgets to take his medication.  After discussing patient with Pecolia Ades, NP, advised patient to make another nurse visit and be sure to take his medication as directed.  Patient understands.  No other concerns at this time.

## 2014-07-15 NOTE — Patient Instructions (Signed)
Nurse visit in 1 week.  Please take all medication before visit.

## 2014-07-26 ENCOUNTER — Ambulatory Visit: Payer: Self-pay | Attending: Internal Medicine | Admitting: *Deleted

## 2014-07-26 VITALS — BP 164/56 | HR 60 | Temp 97.7°F | Resp 16 | Wt 191.0 lb

## 2014-07-26 DIAGNOSIS — I10 Essential (primary) hypertension: Secondary | ICD-10-CM | POA: Insufficient documentation

## 2014-07-26 MED ORDER — AMLODIPINE BESYLATE 10 MG PO TABS: 10.0000 mg | ORAL_TABLET | Freq: Every day | ORAL | Status: DC

## 2014-07-26 MED ORDER — HYDRALAZINE HCL 50 MG PO TABS: 50.0000 mg | ORAL_TABLET | Freq: Two times a day (BID) | ORAL | Status: DC

## 2014-07-26 NOTE — Progress Notes (Signed)
Patient presents for BP check Med list reviewed; states taking all meds as directed except taking amlodipine 5 mg daily. Was unaware to take 10 mg instead of 5 mg Also taking lasix 40 mg bid, metoprolol 100 mg bid and hydralazine 50 mg bid Patient states he is trying to follow low sodium diet States he stays active around house and yard. C/o calf cramping with walking distances Patient denies headaches, blurred vision, SHOB, chest pain  Denies LE edema States feeling better and has more energy  Filed Vitals:   07/26/14 0930  BP: 164/56  Pulse: 60  Temp: 97.7 F (36.5 C)  Resp: 16   WT 191 lb  Patient will increase amlodipine to 10 mg as previously directed and return in 2 weeks for nurse visit for BP check  Patient advised to call for med refills at least 7 days before running out so as not to go without.

## 2014-08-12 ENCOUNTER — Telehealth: Payer: Self-pay | Admitting: Internal Medicine

## 2014-08-12 ENCOUNTER — Other Ambulatory Visit: Payer: Self-pay | Admitting: Internal Medicine

## 2014-08-12 ENCOUNTER — Telehealth: Payer: Self-pay

## 2014-08-12 DIAGNOSIS — N289 Disorder of kidney and ureter, unspecified: Secondary | ICD-10-CM

## 2014-08-12 MED ORDER — GEMFIBROZIL 600 MG PO TABS: 600.0000 mg | ORAL_TABLET | Freq: Two times a day (BID) | ORAL | Status: DC

## 2014-08-12 NOTE — Telephone Encounter (Signed)
Patient came into office requesting medication refill for gemfibrozil (LOPID) 600 MG tablet. Patient would like 60-90 day supply. Please f/u with patient

## 2014-08-12 NOTE — Telephone Encounter (Signed)
Spoke with patient regarding recommendations from [place] kidney US scheduled for  08/20/2014 at 7am Blood work scheduled for 08/18/2014 Patient is aware of both appointments

## 2014-08-18 ENCOUNTER — Ambulatory Visit: Payer: Self-pay | Attending: Internal Medicine | Admitting: *Deleted

## 2014-08-18 ENCOUNTER — Ambulatory Visit: Payer: Self-pay

## 2014-08-18 VITALS — BP 170/50 | HR 66 | Temp 98.0°F | Resp 18 | Ht 70.0 in | Wt 186.4 lb

## 2014-08-18 DIAGNOSIS — I10 Essential (primary) hypertension: Secondary | ICD-10-CM | POA: Insufficient documentation

## 2014-08-18 DIAGNOSIS — N289 Disorder of kidney and ureter, unspecified: Secondary | ICD-10-CM

## 2014-08-18 DIAGNOSIS — Z87891 Personal history of nicotine dependence: Secondary | ICD-10-CM | POA: Insufficient documentation

## 2014-08-18 LAB — CBC WITH DIFFERENTIAL/PLATELET
BASOS PCT: 0 % (ref 0–1)
Basophils Absolute: 0 10*3/uL (ref 0.0–0.1)
EOS ABS: 0.2 10*3/uL (ref 0.0–0.7)
Eosinophils Relative: 3 % (ref 0–5)
HCT: 27.8 % — ABNORMAL LOW (ref 39.0–52.0)
Hemoglobin: 9.5 g/dL — ABNORMAL LOW (ref 13.0–17.0)
LYMPHS ABS: 1.5 10*3/uL (ref 0.7–4.0)
Lymphocytes Relative: 26 % (ref 12–46)
MCH: 29.7 pg (ref 26.0–34.0)
MCHC: 34.2 g/dL (ref 30.0–36.0)
MCV: 86.9 fL (ref 78.0–100.0)
MPV: 8.7 fL (ref 8.6–12.4)
Monocytes Absolute: 0.6 10*3/uL (ref 0.1–1.0)
Monocytes Relative: 11 % (ref 3–12)
Neutro Abs: 3.5 10*3/uL (ref 1.7–7.7)
Neutrophils Relative %: 60 % (ref 43–77)
Platelets: 194 10*3/uL (ref 150–400)
RBC: 3.2 MIL/uL — ABNORMAL LOW (ref 4.22–5.81)
RDW: 15.1 % (ref 11.5–15.5)
WBC: 5.8 10*3/uL (ref 4.0–10.5)

## 2014-08-18 LAB — RENAL FUNCTION PANEL
Albumin: 4.1 g/dL (ref 3.5–5.2)
BUN: 44 mg/dL — ABNORMAL HIGH (ref 6–23)
CO2: 19 meq/L (ref 19–32)
Calcium: 9 mg/dL (ref 8.4–10.5)
Chloride: 110 meq/L (ref 96–112)
Creat: 2.97 mg/dL — ABNORMAL HIGH (ref 0.50–1.35)
Glucose, Bld: 120 mg/dL — ABNORMAL HIGH (ref 70–99)
Phosphorus: 3.9 mg/dL (ref 2.3–4.6)
Potassium: 3.8 meq/L (ref 3.5–5.3)
Sodium: 142 meq/L (ref 135–145)

## 2014-08-18 MED ORDER — HYDRALAZINE HCL 50 MG PO TABS: 50.0000 mg | ORAL_TABLET | Freq: Three times a day (TID) | ORAL | Status: DC

## 2014-08-18 NOTE — Progress Notes (Signed)
Patient presents for lab work and BP check after increasing amlodipine to 10 mg daily. Took dose 30 minutes ago. Med list reviewed; states taking all meds as directed Trying to follow low sodium diet Patient denies headaches, blurred vision, SHOB, chest pain  States decreased b/l leg swelling. Still elevating legs while sitting States feeling better, able to do more physical activity.  Still with b/l leg cramps Saw kidney specialist today. Kidney U/S scheduled for 08/20/14.  Will return to kidney specialist in 30 days States feeling stressed at present; trying to buy car for daughter  Danley Danker Vitals:   08/18/14 1519  BP: 170/50  Pulse: 66  Temp: 98 F (36.7 C)  Resp: 18    Per PCP: Increase hydralazine to 50 mg tid  Patient to return in 2 weeks for nurse visit for BP check

## 2014-08-19 LAB — PARATHYROID HORMONE, INTACT (NO CA): PTH: 108 pg/mL — ABNORMAL HIGH (ref 14–64)

## 2014-08-20 ENCOUNTER — Ambulatory Visit (HOSPITAL_COMMUNITY)
Admission: RE | Admit: 2014-08-20 | Discharge: 2014-08-20 | Disposition: A | Discharge status: Home / Self Care | Payer: Self-pay | Source: Ambulatory Visit | Attending: Internal Medicine | Admitting: Internal Medicine

## 2014-08-20 DIAGNOSIS — N289 Disorder of kidney and ureter, unspecified: Secondary | ICD-10-CM

## 2014-08-20 DIAGNOSIS — I10 Essential (primary) hypertension: Secondary | ICD-10-CM | POA: Insufficient documentation

## 2014-08-20 DIAGNOSIS — N281 Cyst of kidney, acquired: Secondary | ICD-10-CM | POA: Insufficient documentation

## 2014-08-20 LAB — PROTEIN ELECTROPHORESIS, SERUM
ALPHA-1-GLOBULIN: 0.3 g/dL (ref 0.2–0.3)
Abnormal Protein Band1: 0.3 g/dL
Albumin ELP: 4 g/dL (ref 3.8–4.8)
Alpha-2-Globulin: 0.9 g/dL (ref 0.5–0.9)
BETA GLOBULIN: 0.4 g/dL (ref 0.4–0.6)
Beta 2: 0.3 g/dL (ref 0.2–0.5)
GAMMA GLOBULIN: 0.6 g/dL — AB (ref 0.8–1.7)
Total Protein, Serum Electrophoresis: 6.4 g/dL (ref 6.1–8.1)

## 2014-08-26 ENCOUNTER — Telehealth: Payer: Self-pay

## 2014-08-26 NOTE — Telephone Encounter (Signed)
Patient is aware of his ultrasound results And has an appointment scheduled with nephrologist

## 2014-08-26 NOTE — Telephone Encounter (Signed)
-----   Message from Lorayne Marek, MD sent at 08/24/2014  4:19 PM EDT ----- Call and let the patient know that his renal ultrasound reported IMPRESSION: Increased cortical echotexture both kidneys consistent with medical renal disease. There is no hydronephrosis.  Also his  blood work shows worsening renal function with elevated PTH level , advise patient to schedule followup appointment with his nephrologist.

## 2014-08-30 ENCOUNTER — Other Ambulatory Visit (HOSPITAL_COMMUNITY): Payer: Self-pay | Admitting: Nephrology

## 2014-08-30 DIAGNOSIS — R809 Proteinuria, unspecified: Secondary | ICD-10-CM

## 2014-08-30 DIAGNOSIS — N183 Chronic kidney disease, stage 3 unspecified: Secondary | ICD-10-CM

## 2014-08-30 DIAGNOSIS — R319 Hematuria, unspecified: Secondary | ICD-10-CM

## 2014-08-30 DIAGNOSIS — I159 Secondary hypertension, unspecified: Secondary | ICD-10-CM

## 2014-09-07 ENCOUNTER — Other Ambulatory Visit: Payer: Self-pay | Admitting: Radiology

## 2014-09-07 ENCOUNTER — Other Ambulatory Visit: Payer: Self-pay | Admitting: Physician Assistant

## 2014-09-08 ENCOUNTER — Encounter (HOSPITAL_COMMUNITY): Payer: Self-pay

## 2014-09-08 ENCOUNTER — Ambulatory Visit (HOSPITAL_COMMUNITY)
Admission: RE | Admit: 2014-09-08 | Discharge: 2014-09-08 | Disposition: A | Discharge status: Home / Self Care | Payer: Self-pay | Source: Ambulatory Visit | Attending: Nephrology | Admitting: Nephrology

## 2014-09-08 ENCOUNTER — Observation Stay (HOSPITAL_COMMUNITY)
Admission: AD | Admit: 2014-09-08 | Discharge: 2014-09-09 | Disposition: A | Discharge status: Home / Self Care | Payer: Self-pay | Source: Ambulatory Visit | Attending: Interventional Radiology | Admitting: Interventional Radiology

## 2014-09-08 ENCOUNTER — Ambulatory Visit (HOSPITAL_COMMUNITY)
Admission: RE | Admit: 2014-09-08 | Discharge: 2014-09-08 | Disposition: A | Discharge status: Home / Self Care | Payer: Self-pay | Source: Ambulatory Visit | Attending: Radiology | Admitting: Radiology

## 2014-09-08 ENCOUNTER — Encounter (HOSPITAL_COMMUNITY): Payer: Self-pay | Admitting: General Practice

## 2014-09-08 VITALS — BP 163/60 | HR 67 | Temp 97.9°F | Resp 16 | Ht 68.0 in | Wt 180.0 lb

## 2014-09-08 DIAGNOSIS — N183 Chronic kidney disease, stage 3 unspecified: Secondary | ICD-10-CM

## 2014-09-08 DIAGNOSIS — M549 Dorsalgia, unspecified: Secondary | ICD-10-CM

## 2014-09-08 DIAGNOSIS — R319 Hematuria, unspecified: Secondary | ICD-10-CM | POA: Insufficient documentation

## 2014-09-08 DIAGNOSIS — R809 Proteinuria, unspecified: Secondary | ICD-10-CM | POA: Insufficient documentation

## 2014-09-08 DIAGNOSIS — S37011A Minor contusion of right kidney, initial encounter: Secondary | ICD-10-CM | POA: Diagnosis present

## 2014-09-08 DIAGNOSIS — E859 Amyloidosis, unspecified: Secondary | ICD-10-CM | POA: Insufficient documentation

## 2014-09-08 DIAGNOSIS — I159 Secondary hypertension, unspecified: Secondary | ICD-10-CM

## 2014-09-08 DIAGNOSIS — I129 Hypertensive chronic kidney disease with stage 1 through stage 4 chronic kidney disease, or unspecified chronic kidney disease: Principal | ICD-10-CM | POA: Insufficient documentation

## 2014-09-08 DIAGNOSIS — Z79899 Other long term (current) drug therapy: Secondary | ICD-10-CM | POA: Insufficient documentation

## 2014-09-08 DIAGNOSIS — Z87891 Personal history of nicotine dependence: Secondary | ICD-10-CM | POA: Insufficient documentation

## 2014-09-08 DIAGNOSIS — I5032 Chronic diastolic (congestive) heart failure: Secondary | ICD-10-CM | POA: Insufficient documentation

## 2014-09-08 DIAGNOSIS — E781 Pure hyperglyceridemia: Secondary | ICD-10-CM | POA: Insufficient documentation

## 2014-09-08 DIAGNOSIS — E785 Hyperlipidemia, unspecified: Secondary | ICD-10-CM | POA: Insufficient documentation

## 2014-09-08 DIAGNOSIS — N99820 Postprocedural hemorrhage and hematoma of a genitourinary system organ or structure following a genitourinary system procedure: Secondary | ICD-10-CM | POA: Insufficient documentation

## 2014-09-08 DIAGNOSIS — D62 Acute posthemorrhagic anemia: Secondary | ICD-10-CM | POA: Insufficient documentation

## 2014-09-08 DIAGNOSIS — Y848 Other medical procedures as the cause of abnormal reaction of the patient, or of later complication, without mention of misadventure at the time of the procedure: Secondary | ICD-10-CM | POA: Insufficient documentation

## 2014-09-08 DIAGNOSIS — I10 Essential (primary) hypertension: Secondary | ICD-10-CM | POA: Diagnosis present

## 2014-09-08 HISTORY — PX: RENAL BIOPSY, PERCUTANEOUS: SUR144

## 2014-09-08 HISTORY — DX: Anemia, unspecified: D64.9

## 2014-09-08 HISTORY — DX: Personal history of other medical treatment: Z92.89

## 2014-09-08 HISTORY — DX: Family history of other specified conditions: Z84.89

## 2014-09-08 LAB — CBC
HCT: 22.6 % — ABNORMAL LOW (ref 39.0–52.0)
HEMOGLOBIN: 7.8 g/dL — AB (ref 13.0–17.0)
MCH: 29.3 pg (ref 26.0–34.0)
MCHC: 34.5 g/dL (ref 30.0–36.0)
MCV: 85 fL (ref 78.0–100.0)
Platelets: 197 10*3/uL (ref 150–400)
RBC: 2.66 MIL/uL — ABNORMAL LOW (ref 4.22–5.81)
RDW: 14.1 % (ref 11.5–15.5)
WBC: 6 10*3/uL (ref 4.0–10.5)

## 2014-09-08 LAB — CBC WITH DIFFERENTIAL/PLATELET
BASOS ABS: 0 10*3/uL (ref 0.0–0.1)
BASOS PCT: 0 % (ref 0–1)
EOS PCT: 2 % (ref 0–5)
Eosinophils Absolute: 0.1 10*3/uL (ref 0.0–0.7)
HCT: 26.7 % — ABNORMAL LOW (ref 39.0–52.0)
HEMOGLOBIN: 9.2 g/dL — AB (ref 13.0–17.0)
Lymphocytes Relative: 26 % (ref 12–46)
Lymphs Abs: 1.3 10*3/uL (ref 0.7–4.0)
MCH: 29.6 pg (ref 26.0–34.0)
MCHC: 34.5 g/dL (ref 30.0–36.0)
MCV: 85.9 fL (ref 78.0–100.0)
Monocytes Absolute: 0.5 10*3/uL (ref 0.1–1.0)
Monocytes Relative: 10 % (ref 3–12)
Neutro Abs: 3.1 10*3/uL (ref 1.7–7.7)
Neutrophils Relative %: 62 % (ref 43–77)
Platelets: 164 10*3/uL (ref 150–400)
RBC: 3.11 MIL/uL — ABNORMAL LOW (ref 4.22–5.81)
RDW: 14.1 % (ref 11.5–15.5)
WBC: 5.1 10*3/uL (ref 4.0–10.5)

## 2014-09-08 LAB — PROTIME-INR
INR: 1.26 (ref 0.00–1.49)
PROTHROMBIN TIME: 15.9 s — AB (ref 11.6–15.2)

## 2014-09-08 LAB — ABO/RH: ABO/RH(D): O POS

## 2014-09-08 LAB — APTT: APTT: 43 s — AB (ref 24–37)

## 2014-09-08 LAB — HEMOGLOBIN AND HEMATOCRIT, BLOOD
HCT: 24 % — ABNORMAL LOW (ref 39.0–52.0)
Hemoglobin: 8.3 g/dL — ABNORMAL LOW (ref 13.0–17.0)

## 2014-09-08 LAB — PREPARE RBC (CROSSMATCH)

## 2014-09-08 MED ORDER — SODIUM CHLORIDE 0.9 % IV SOLN
INTRAVENOUS | Status: AC
  Administered 2014-09-09: 05:00:00 via INTRAVENOUS

## 2014-09-08 MED ORDER — GEMFIBROZIL 600 MG PO TABS
600.0000 mg | ORAL_TABLET | Freq: Two times a day (BID) | ORAL | Status: DC
  Administered 2014-09-08 – 2014-09-09 (×2): 600 mg via ORAL
  Filled 2014-09-08 (×4): qty 1

## 2014-09-08 MED ORDER — HYDROCODONE-ACETAMINOPHEN 5-325 MG PO TABS: 1.0000 | ORAL_TABLET | ORAL | Status: DC | PRN

## 2014-09-08 MED ORDER — SODIUM CHLORIDE 0.9 % IV SOLN: INTRAVENOUS | Status: DC

## 2014-09-08 MED ORDER — LIDOCAINE HCL (PF) 1 % IJ SOLN
INTRAMUSCULAR | Status: AC
  Filled 2014-09-08: qty 10

## 2014-09-08 MED ORDER — METOPROLOL TARTRATE 100 MG PO TABS
100.0000 mg | ORAL_TABLET | Freq: Two times a day (BID) | ORAL | Status: DC
  Administered 2014-09-08 – 2014-09-09 (×2): 100 mg via ORAL
  Filled 2014-09-08 (×2): qty 1

## 2014-09-08 MED ORDER — AMLODIPINE BESYLATE 10 MG PO TABS
10.0000 mg | ORAL_TABLET | Freq: Every day | ORAL | Status: DC
  Administered 2014-09-08 – 2014-09-09 (×2): 10 mg via ORAL
  Filled 2014-09-08 (×2): qty 1

## 2014-09-08 MED ORDER — GEMFIBROZIL 600 MG PO TABS
600.0000 mg | ORAL_TABLET | Freq: Two times a day (BID) | ORAL | Status: DC
  Filled 2014-09-08: qty 1

## 2014-09-08 MED ORDER — AMLODIPINE BESYLATE 10 MG PO TABS
10.0000 mg | ORAL_TABLET | Freq: Every day | ORAL | Status: DC
  Filled 2014-09-08: qty 1

## 2014-09-08 MED ORDER — MIDAZOLAM HCL 2 MG/2ML IJ SOLN
INTRAMUSCULAR | Status: AC | PRN
  Administered 2014-09-08: 1 mg via INTRAVENOUS
  Administered 2014-09-08: 0.5 mg via INTRAVENOUS

## 2014-09-08 MED ORDER — FENTANYL CITRATE (PF) 100 MCG/2ML IJ SOLN
INTRAMUSCULAR | Status: AC | PRN
  Administered 2014-09-08: 25 ug via INTRAVENOUS
  Administered 2014-09-08: 50 ug via INTRAVENOUS

## 2014-09-08 MED ORDER — HYDROMORPHONE HCL 1 MG/ML IJ SOLN: INTRAMUSCULAR | Status: AC | PRN

## 2014-09-08 MED ORDER — VITAMIN E 45 MG (100 UNIT) PO CAPS
100.0000 [IU] | ORAL_CAPSULE | ORAL | Status: DC
  Administered 2014-09-09: 100 [IU] via ORAL
  Filled 2014-09-08: qty 1

## 2014-09-08 MED ORDER — GELATIN ABSORBABLE 12-7 MM EX MISC
CUTANEOUS | Status: AC
  Filled 2014-09-08: qty 1

## 2014-09-08 MED ORDER — FENTANYL CITRATE (PF) 100 MCG/2ML IJ SOLN
INTRAMUSCULAR | Status: AC
  Filled 2014-09-08: qty 2

## 2014-09-08 MED ORDER — METOPROLOL TARTRATE 50 MG PO TABS
100.0000 mg | ORAL_TABLET | Freq: Two times a day (BID) | ORAL | Status: DC
Start: 2014-09-08 — End: 2014-09-08

## 2014-09-08 MED ORDER — FUROSEMIDE 40 MG PO TABS
40.0000 mg | ORAL_TABLET | Freq: Two times a day (BID) | ORAL | Status: DC
  Filled 2014-09-08: qty 1

## 2014-09-08 MED ORDER — SODIUM CHLORIDE 0.9 % IV SOLN: Freq: Once | INTRAVENOUS | Status: DC

## 2014-09-08 MED ORDER — SODIUM CHLORIDE 0.9 % IV SOLN
INTRAVENOUS | Status: DC
  Administered 2014-09-08: 10:00:00 via INTRAVENOUS

## 2014-09-08 MED ORDER — HYDRALAZINE HCL 50 MG PO TABS
50.0000 mg | ORAL_TABLET | Freq: Three times a day (TID) | ORAL | Status: DC
  Filled 2014-09-08: qty 1

## 2014-09-08 MED ORDER — MIDAZOLAM HCL 2 MG/2ML IJ SOLN
INTRAMUSCULAR | Status: AC
  Filled 2014-09-08: qty 2

## 2014-09-08 MED ORDER — FUROSEMIDE 40 MG PO TABS: 40.0000 mg | ORAL_TABLET | Freq: Two times a day (BID) | ORAL | Status: DC

## 2014-09-08 MED ORDER — HYDRALAZINE HCL 50 MG PO TABS
50.0000 mg | ORAL_TABLET | Freq: Three times a day (TID) | ORAL | Status: DC
  Administered 2014-09-08 – 2014-09-09 (×4): 50 mg via ORAL
  Filled 2014-09-08: qty 1
  Filled 2014-09-08: qty 2
  Filled 2014-09-08 (×2): qty 1

## 2014-09-08 NOTE — Progress Notes (Signed)
Patient ID: Jose Garrett, male   DOB: November 30, 1950, 64 y.o.   MRN: [id number]   CT does reveal large Rt renal hematoma  Discussed with Dr Pascal Lux Plan: admit to observation See orders for fluids and labs  Will discuss with [facility] if appropriate to admit to [facility] service

## 2014-09-08 NOTE — Progress Notes (Signed)
Pt returned from CT scan. Site unchanged.

## 2014-09-08 NOTE — Progress Notes (Signed)
Patient ID: Jose Garrett, male   DOB: [birth year], 64 y.o.   MRN: [id number]   Rt random renal bx this am  Back pain complaint examined pt and ordered stat cbc Hg 9.2 this am to 7.8 now  Discussed with Dr Pascal Lux Stat CT ab/pel w/o cx   Called to RN and CT tech

## 2014-09-08 NOTE — Procedures (Signed)
US renal core bx [id number] [id number] RLP No complication No blood loss. See complete dictation in [facility].

## 2014-09-08 NOTE — Sedation Documentation (Signed)
Patient denies pain and is resting comfortably.  

## 2014-09-08 NOTE — Consult Note (Signed)
[facility] Consultation Note                                                                                    Jose Garrett, is a 64 y.o. male  MRN: [id number]   DOB - 1950-08-21  Admit Date - 09/08/2014  Outpatient Primary MD for the patient is Lorayne Marek, MD  Referring MD: [name] / IR  Reason for consultation: Medical management of post procedure anemia and other chronic medical problems  With History of -  Past Medical History  Diagnosis Date  . Hypertension   . Hyperlipidemia   . Renal disorder       Past Surgical History  Procedure Laterality Date  . Cholecystectomy    . Tonsillectomy      in for postprocedure renal hematoma with acute blood loss anemia    HPI This is a 64 year old male patient with known chronic kidney disease, hypertension and dyslipidemia as well as chronic grade 1 diastolic heart failure who underwent renal ultrasound guided biopsy and interventional radiology today with subsequent development of a right renal hematoma in the nearly 2 g drop in hemoglobin postprocedure. Patient has subsequently been admitted by the radiology service, the patient's nephrologist has also been contacted by interventional radiology. Medical consultation has been requested to assist in the management of the acute anemia and the patient's other chronic medical problems. In talking with the patient he is currently asymptomatic in regards to the anemia. He is not having any chest pain or shortness of breath. No headache or dizziness either sitting in the bed or with ambulation. He is not having any pain.   Review of Systems   In addition to the HPI above,  No Fever-chills, myalgias or other constitutional symptoms No Headache, changes with Vision or hearing, new weakness, tingling, numbness in any extremity, No problems swallowing food or Liquids, indigestion/reflux No Chest pain, Cough or Shortness of Breath, palpitations, orthopnea or DOE No Abdominal pain,  N/V; no melena or hematochezia, no dark tarry stools, Bowel movements are regular, No new skin rashes, lesions, masses or bruises, No new joints pains-aches No recent weight gain or loss No polyuria, polydypsia or polyphagia,  *A full 10 point Review of Systems was done, except as stated above, all other Review of Systems were negative.  Social History History  Substance Use Topics  . Smoking status: Former Smoker -- 40 years  . Smokeless tobacco: Former Systems developer    Quit date: 03/19/2011  . Alcohol Use: No    Resides at: Private residence  Lives with: Daughter  Ambulatory status: Without assistive devices   Family History Family History  Problem Relation Age of Onset  . Hypertension Mother   . Diabetes Mother   . Heart disease Mother   . Hypertension Father   . Diabetes Father   . Diabetes Sister      Prior to Admission medications   Medication Sig Start Date End Date Taking? Authorizing Provider  amLODipine (NORVASC) 10 MG tablet Take 1 tablet (10 mg total) by mouth daily. 07/26/14   Lorayne Marek, MD  furosemide (LASIX) 40 MG tablet Take 40 mg by mouth 2 (two) times  daily.    Historical Provider, MD  gemfibrozil (LOPID) 600 MG tablet Take 1 tablet (600 mg total) by mouth 2 (two) times daily before a meal. 08/12/14   Lorayne Marek, MD  hydrALAZINE (APRESOLINE) 50 MG tablet Take 1 tablet (50 mg total) by mouth 3 (three) times daily. 08/18/14   Lorayne Marek, MD  metoprolol (LOPRESSOR) 100 MG tablet Take 1 tablet (100 mg total) by mouth 2 (two) times daily. 05/21/14   Lorayne Marek, MD  vitamin E 100 UNIT capsule Take 100 Units by mouth 2 (two) times a week.    Historical Provider, MD    No Known Allergies  Physical Exam  Vitals  Blood pressure 172/60, pulse 66, temperature 98.3 F (36.8 C), temperature source Oral, resp. rate 18, SpO2 100 %.   General:  In no acute distress, appears healthy and well nourished  Psych:  Normal affect, Denies Suicidal or Homicidal  ideations, Awake Alert, Oriented X 3. Speech and thought patterns are clear and appropriate, no apparent short term memory deficits  Neuro:   No focal neurological deficits, CN II through XII intact, Strength 5/5 all 4 extremities, Sensation intact all 4 extremities.  ENT:  Ears and Eyes appear Normal, Conjunctivae clear, PER. Moist oral mucosa without erythema or exudates.  Neck:  Supple, No lymphadenopathy appreciated  Respiratory:  Symmetrical chest wall movement, Good air movement bilaterally, CTAB. Room Air  Cardiac:  RRR, No Murmurs, no LE edema noted, no JVD, No carotid bruits, peripheral pulses palpable at 2+  Abdomen:  Positive bowel sounds, Soft, Non tender, Non distended,  No masses appreciated, no obvious hepatosplenomegaly  Skin:  No Cyanosis, Normal Skin Turgor, No Skin Rash or Bruise.  Extremities: Symmetrical without obvious trauma or injury,  no effusions.  Data Review  CBC  Recent Labs Lab 09/08/14 0926 09/08/14 1333  WBC 5.1 6.0  HGB 9.2* 7.8*  HCT 26.7* 22.6*  PLT 164 197  MCV 85.9 85.0  MCH 29.6 29.3  MCHC 34.5 34.5  RDW 14.1 14.1  LYMPHSABS 1.3  --   MONOABS 0.5  --   EOSABS 0.1  --   BASOSABS 0.0  --     Chemistries  No results for input(s): NA, K, CL, CO2, GLUCOSE, BUN, CREATININE, CALCIUM, MG, AST, ALT, ALKPHOS, BILITOT in the last 168 hours.  Invalid input(s): GFRCGP  CrCl cannot be calculated (Patient has no serum creatinine result on file.).  No results for input(s): TSH, T4TOTAL, T3FREE, THYROIDAB in the last 72 hours.  Invalid input(s): FREET3  Coagulation profile  Recent Labs Lab 09/08/14 0926  INR 1.26    No results for input(s): DDIMER in the last 72 hours.  Cardiac Enzymes No results for input(s): CKMB, TROPONINI, MYOGLOBIN in the last 168 hours.  Invalid input(s): CK  Invalid input(s): POCBNP  Urinalysis No results found for: COLORURINE, APPEARANCEUR, LABSPEC, PHURINE, GLUCOSEU, HGBUR, BILIRUBINUR, KETONESUR,  PROTEINUR, UROBILINOGEN, NITRITE, LEUKOCYTESUR  Imaging results:   Ct Abdomen Pelvis Wo Contrast  09/08/2014   CLINICAL DATA:  Post right renal biopsy this morning with drop in hemoglobin. Question bleed.  EXAM: CT ABDOMEN AND PELVIS WITHOUT CONTRAST  TECHNIQUE: Multidetector CT imaging of the abdomen and pelvis was performed following the standard protocol without IV contrast.  COMPARISON:  None.  FINDINGS: Linear areas of atelectasis in the lung bases bilaterally. Heart is normal size. No effusions.  Prior cholecystectomy. Small low-density lesion within the right hepatic lobe, measuring 14 mm, most likely cysts. Pancreas, spleen, adrenals and left kidney  are unremarkable. There is a large right perinephric hematoma, measuring approximately 12 x 10 cm. The right kidney is displaced anteriorly. No hydronephrosis bilaterally.  Stomach and small bowel are unremarkable. Sigmoid diverticulosis. No active diverticulitis. No free fluid, free air or adenopathy. The aorta and iliac vessels are calcified no aneurysm.  No acute bony abnormality or focal bone lesion.  IMPRESSION: Large right perinephric hematoma, approximately 12 x 10 cm. This displaces the right kidney out of the right renal fossa. No hydronephrosis.  Sigmoid diverticulosis.  These results were called by telephone at the time of interpretation on 09/08/2014 at 3:00 pm to [facility] , who verbally acknowledged these results.   Electronically Signed   By: Rolm Baptise M.D.   On: 09/08/2014 15:01   US Renal  08/20/2014   CLINICAL DATA:  Renal insufficiency, history of hypertension  EXAM: RENAL / URINARY TRACT ULTRASOUND COMPLETE  COMPARISON:  None.  FINDINGS: Right Kidney:  Length: 11.2 cm. The renal cortical echotexture is increased and greater than that of the adjacent liver. There is a lower pole cyst measuring 1.2 cm in diameter.  Left Kidney:  Length: 11 cm. The renal cortical echotexture is diffusely increased. There is no hydronephrosis or  focal mass.  Bladder:  Appears normal for degree of bladder distention.  IMPRESSION: Increased cortical echotexture both kidneys consistent with medical renal disease. There is no hydronephrosis.   Electronically Signed   By: David  Martinique M.D.   On: 08/20/2014 08:16   US Biopsy  09/08/2014   CLINICAL DATA:  Chronic renal   insufficiency, stage III.  COMPARISON:  Ultrasound 08/20/2014  EXAM: ULTRASOUND GUIDED RENAL CORE BIOPSY  TECHNIQUE: The procedure, risks (including but not limited to bleeding, infection, organ damage ), benefits, and alternatives were explained to the patient. Questions regarding the procedure were encouraged and answered. The patient understands and consents to the procedure. Survey ultrasound was performed and an appropriate skin entry site was localized. Site was marked, prePped with Betadine, draped in usual sterile fashion, infiltrated locally with 1% lidocaine. Intravenous Fentanyl and Versed were administered as conscious sedation during continuous cardiorespiratory monitoring by the radiology RN with a total moderate sedation time of 6 minutes.  Under real time ultrasound guidance, a 15 gauge trocar needle was advanced to the margin of the lower pole of the right kidney. Three 16 gauge coaxial core samples were obtained. The guide needle removed. The core samples were submitted to pathology. Postprocedure scans demonstrate no immediate complication. The patient tolerated procedure well.  COMPLICATIONS: COMPLICATIONS none  IMPRESSION: 1. Technically successful ultrasound-guided core renal biopsy , right lower pole.   Electronically Signed   By: Lucrezia Europe M.D.   On: 09/08/2014 11:52     Assessment & Plan  Principal Problem:   Renal hematoma, right -Per primary team/interventional radiology  Active Problems:   Acute blood loss anemia -Primary team has initiated every 6 hour H&H checks -Baseline hemoglobin between 9.2 and 9.5 with current postprocedure hemoglobin down to  7.8 -We'll transfuse at least 1 unit and recommend repeat transfusion for hemoglobins less than or equal to 7.5 -Currently not symptomatic    Essential hypertension, benign -Hypertensive at present -Continue preadmission medications: Norvasc, a presently and Lopressor    Chronic diastolic heart failure, NYHA class 1 -Postprocedure patient receiving IV fluids at 75 mL per hour; placed a time limit of 16 hours as well as orders to hold maintenance IV fluids during infusion of blood products -Preadmission Lasix currently on hold  Hypertriglyceridemia -Continue preadmission Lopid    Chronic renal disease, stage III -Dr. [name] has been made aware of patient's admission -BUN slightly elevated from baseline of 30 now is 40 otherwise creatinine stable and at baseline    DVT Prophylaxis: SCDs  Family Communication:   No family at bedside  Code Status:  Full code  Condition:  Stable  Discharge disposition: At discretion of primary team; anticipate once hemodynamically stable and no further evidence of right renal hemorrhage patient will be appropriate for discharge home  Time spent in minutes : 60      ELLIS,ALLISON L. ANP on 09/08/2014 at 5:01 PM  Between 7am to 7pm - Pager - [phone]  After 7pm go to [email or web address] - password [email or web address]  And look for the night coverage person covering me after hours  [facility]

## 2014-09-08 NOTE — Progress Notes (Signed)
Patient ID: Jose Garrett, male   DOB: 1950/11/09, 64 y.o.   MRN: [id number]   Dr [name] and [facility] aware of admission [facility] to consult for medical management

## 2014-09-08 NOTE — Progress Notes (Signed)
PA notified of swelling/ pain below biopsy site.

## 2014-09-08 NOTE — H&P (Signed)
Chief Complaint: Worsening renal fxn Proteinuria Hematuria   Referring Physician(s): Goldsborough,Kellie  History of Present Illness: Jose Garrett is a 64 y.o. male   Pt has known of renal fxn fluctuation for few yrs MD referred pt to Dr [name] when fxns continued to worsen Hematuria; proteinuria Now scheduled for random renal bx   Past Medical History  Diagnosis Date  . Hypertension   . Hyperlipidemia   . Renal disorder     Past Surgical History  Procedure Laterality Date  . Cholecystectomy    . Tonsillectomy      Allergies: Review of patient's allergies indicates no known allergies.  Medications: Prior to Admission medications   Medication Sig Start Date End Date Taking? Authorizing Provider  amLODipine (NORVASC) 10 MG tablet Take 1 tablet (10 mg total) by mouth daily. 07/26/14  Yes Lorayne Marek, MD  furosemide (LASIX) 40 MG tablet Take 40 mg by mouth 2 (two) times daily.   Yes Historical Provider, MD  gemfibrozil (LOPID) 600 MG tablet Take 1 tablet (600 mg total) by mouth 2 (two) times daily before a meal. 08/12/14  Yes Deepak Advani, MD  hydrALAZINE (APRESOLINE) 50 MG tablet Take 1 tablet (50 mg total) by mouth 3 (three) times daily. 08/18/14  Yes Lorayne Marek, MD  metoprolol (LOPRESSOR) 100 MG tablet Take 1 tablet (100 mg total) by mouth 2 (two) times daily. 05/21/14  Yes Lorayne Marek, MD  vitamin E 100 UNIT capsule Take 100 Units by mouth 2 (two) times a week.   Yes Historical Provider, MD     Family History  Problem Relation Age of Onset  . Hypertension Mother   . Diabetes Mother   . Heart disease Mother   . Hypertension Father   . Diabetes Father   . Diabetes Sister     History   Social History  . Marital Status: Legally Separated    Spouse Name: N/A  . Number of Children: N/A  . Years of Education: N/A   Social History Main Topics  . Smoking status: Former Smoker -- 40 years  . Smokeless tobacco: Former Systems developer    Quit date:  03/19/2011  . Alcohol Use: No  . Drug Use: Not on file  . Sexual Activity: Not on file   Other Topics Concern  . None   Social History Narrative     Review of Systems: A 12 point ROS discussed and pertinent positives are indicated in the HPI above.  All other systems are negative.  Review of Systems  Constitutional: Negative for activity change, appetite change and unexpected weight change.  Respiratory: Negative for shortness of breath.   Cardiovascular: Negative for chest pain.  Gastrointestinal: Negative for abdominal pain.  Genitourinary: Negative for difficulty urinating.  Neurological: Negative for weakness.  Psychiatric/Behavioral: Negative for behavioral problems and confusion.     Vital Signs: BP 176/64 mmHg  Pulse 63  Temp(Src) 98.2 F (36.8 C)  Resp 20  Ht 5\' 8"  (1.727 m)  Wt 180 lb (81.647 kg)  BMI 27.38 kg/m2  SpO2 100%  Physical Exam  Constitutional: He is oriented to person, place, and time. He appears well-nourished.  Cardiovascular: Normal rate, regular rhythm and normal heart sounds.   No murmur heard. Pulmonary/Chest: Effort normal and breath sounds normal. He has no wheezes.  Abdominal: Soft. Bowel sounds are normal. There is no tenderness.  Musculoskeletal: Normal range of motion.  Neurological: He is alert and oriented to person, place, and time.  Skin: Skin is warm  and dry.  Psychiatric: He has a normal mood and affect. His behavior is normal. Judgment and thought content normal.  Nursing note and vitals reviewed.   Mallampati Score:  MD Evaluation Airway: WNL Heart: WNL Abdomen: WNL Chest/ Lungs: WNL ASA  Classification: 2 Mallampati/Airway Score: One  Imaging: US Renal  08/20/2014   CLINICAL DATA:  Renal insufficiency, history of hypertension  EXAM: RENAL / URINARY TRACT ULTRASOUND COMPLETE  COMPARISON:  None.  FINDINGS: Right Kidney:  Length: 11.2 cm. The renal cortical echotexture is increased and greater than that of the adjacent  liver. There is a lower pole cyst measuring 1.2 cm in diameter.  Left Kidney:  Length: 11 cm. The renal cortical echotexture is diffusely increased. There is no hydronephrosis or focal mass.  Bladder:  Appears normal for degree of bladder distention.  IMPRESSION: Increased cortical echotexture both kidneys consistent with medical renal disease. There is no hydronephrosis.   Electronically Signed   By: David  Martinique M.D.   On: 08/20/2014 08:16    Labs:  CBC:  Recent Labs  02/25/14 0959 03/02/14 1227 08/18/14 1537 09/08/14 0926  WBC 6.5 8.1 5.8 5.1  HGB 9.8* 10.4* 9.5* 9.2*  HCT 28.5* 29.6* 27.8* 26.7*  PLT 238 273 194 164    COAGS:  Recent Labs  09/08/14 0926  INR 1.26  APTT 43*    BMP:  Recent Labs  01/21/14 1129 02/25/14 0959 03/02/14 1227 06/30/14 1528 08/18/14 1537  NA 139 144 141 141 142  K 5.8* 4.9 5.0 4.2 3.8  CL 111 115* 113* 109 110  CO2 17* 18* 18* 18* 19  GLUCOSE 104* 95 92 94 120*  BUN 40* 27* 26* 30* 44*  CALCIUM 9.9 9.0 9.3 9.3 9.0  CREATININE 2.49* 2.56* 2.38* 2.71* 2.97*  GFRNONAA 27* 26* 28* 24*  --   GFRAA 31* 30* 33* 28*  --     LIVER FUNCTION TESTS:  Recent Labs  01/21/14 1129 02/25/14 0959 03/02/14 1227 06/30/14 1528 08/18/14 1537  BILITOT 0.3 0.3 0.3 0.3  --   AST 43* 17 17 18   --   ALT 67* 10 11 11   --   ALKPHOS 60 62 66 71  --   PROT 7.4 6.5 6.5 6.6  --   ALBUMIN 4.5 3.7 3.8 4.0 4.1    TUMOR MARKERS: No results for input(s): AFPTM, CEA, CA199, CHROMGRNA in the last 8760 hours.  Assessment and Plan:  Renal fxns increasing Hematuria;proteinuria Now for random renal bx Risks and Benefits discussed with the patient including, but not limited to bleeding, infection, damage to adjacent structures or low yield requiring additional tests. All of the patient's questions were answered, patient is agreeable to proceed. Consent signed and in chart.   Thank you for this interesting consult.  I greatly enjoyed meeting HARVEST BOSMAN and look forward to participating in their care.  Signed: Aeric Burnham A 09/08/2014, 9:45 AM   I spent a total of  20 Minutes   in face to face in clinical consultation, greater than 50% of which was counseling/coordinating care for random renal bx

## 2014-09-08 NOTE — Progress Notes (Signed)
    RN called to report pt having some pain at Rt renal bx site She feels are is "puffy"  I have seen and examined pt Area is sl tender to touch Soft No hematoma noted I do not appreciate any puffiness when comparing to left side VSS  Ordered stat cbc to check hg  Pt reassured Will watch for result asap

## 2014-09-08 NOTE — Progress Notes (Signed)
Some swelling present below bandaid site. 5/10 pain below site.

## 2014-09-08 NOTE — Discharge Instructions (Signed)
Kidney Biopsy, Care After Refer to this sheet in the next few weeks. These instructions provide you with information on caring for yourself after your procedure. Your health care provider may also give you more specific instructions. Your treatment has been planned according to current medical practices, but problems sometimes occur. Call your health care provider if you have any problems or questions after your procedure.  WHAT TO EXPECT AFTER THE PROCEDURE   You may notice blood in the urine for the first 24 hours after the biopsy.  You may feel some pain at the biopsy site for 1-2 weeks after the biopsy. HOME CARE INSTRUCTIONS  Do not lift anything heavier than 10 lb (4.5 kg) for 2 weeks.  Do not take any non-steroidal anti-inflammatory drugs (NSAIDs) or any blood thinners for a week after the biopsy unless instructed to do so by your health care provider.  Only take medicines for pain, fever, or discomfort as directed by your health care provider. SEEK MEDICAL CARE IF:  You have bloody urine more than 24 hours after the biopsy.   You develop a fever.   You cannot urinate.   You have increasing pain at the biopsy site.  SEEK IMMEDIATE MEDICAL CARE IF: You feel faint or dizzy.  Document Released: 10/29/2012 Document Reviewed: 10/29/2012 [facility] Patient Information 2015 [place]. This information is not intended to replace advice given to you by your health care provider. Make sure you discuss any questions you have with your health care provider.

## 2014-09-09 LAB — HEMOGLOBIN AND HEMATOCRIT, BLOOD
HCT: 22.9 % — ABNORMAL LOW (ref 39.0–52.0)
HCT: 23.1 % — ABNORMAL LOW (ref 39.0–52.0)
Hemoglobin: 7.8 g/dL — ABNORMAL LOW (ref 13.0–17.0)
Hemoglobin: 8.1 g/dL — ABNORMAL LOW (ref 13.0–17.0)

## 2014-09-09 LAB — TYPE AND SCREEN
ABO/RH(D): O POS
ANTIBODY SCREEN: NEGATIVE
Unit division: 0

## 2014-09-09 NOTE — Progress Notes (Signed)
Referring Physician(s): Dr Shirl Harris  Subjective:  Proteinuria; hematuria Worsening renal fxns Random renal bx performed in IR 6/29 as OP Developed large Rt renal hematoma post procedure Admitted overnight for observation Dr [name] aware [facility] consulted for evaluation---appreciate their help Did have 1 unit PRBC transfusion Hg 7.8 this am Pt had restful night Denies pain; N/V Denies sob Will discuss with Dr Pascal Lux  Allergies: Review of patient's allergies indicates no known allergies.  Medications: Prior to Admission medications   Medication Sig Start Date End Date Taking? Authorizing Provider  amLODipine (NORVASC) 10 MG tablet Take 1 tablet (10 mg total) by mouth daily. 07/26/14  Yes Lorayne Marek, MD  furosemide (LASIX) 40 MG tablet Take 40 mg by mouth 2 (two) times daily.   Yes Historical Provider, MD  gemfibrozil (LOPID) 600 MG tablet Take 1 tablet (600 mg total) by mouth 2 (two) times daily before a meal. 08/12/14  Yes Deepak Advani, MD  hydrALAZINE (APRESOLINE) 50 MG tablet Take 1 tablet (50 mg total) by mouth 3 (three) times daily. 08/18/14  Yes Lorayne Marek, MD  metoprolol (LOPRESSOR) 100 MG tablet Take 1 tablet (100 mg total) by mouth 2 (two) times daily. 05/21/14  Yes Lorayne Marek, MD  vitamin E 100 UNIT capsule Take 100 Units by mouth every other day.    Yes Historical Provider, MD     Vital Signs: BP 163/57 mmHg  Pulse 64  Temp(Src) 98.2 F (36.8 C) (Oral)  Resp 19  SpO2 97%  Physical Exam  Constitutional: He appears well-nourished.  afeb VSS In NAD  Cardiovascular: Normal rate and regular rhythm.   Pulmonary/Chest: Effort normal and breath sounds normal.  Abdominal: Soft. Bowel sounds are normal.  Rt flank without hematoma Much less soreness to palpate Site clean and dry  Nursing note and vitals reviewed.   Imaging: Ct Abdomen Pelvis Wo Contrast  09/08/2014   CLINICAL DATA:  Post right renal biopsy this morning with drop in hemoglobin.  Question bleed.  EXAM: CT ABDOMEN AND PELVIS WITHOUT CONTRAST  TECHNIQUE: Multidetector CT imaging of the abdomen and pelvis was performed following the standard protocol without IV contrast.  COMPARISON:  None.  FINDINGS: Linear areas of atelectasis in the lung bases bilaterally. Heart is normal size. No effusions.  Prior cholecystectomy. Small low-density lesion within the right hepatic lobe, measuring 14 mm, most likely cysts. Pancreas, spleen, adrenals and left kidney are unremarkable. There is a large right perinephric hematoma, measuring approximately 12 x 10 cm. The right kidney is displaced anteriorly. No hydronephrosis bilaterally.  Stomach and small bowel are unremarkable. Sigmoid diverticulosis. No active diverticulitis. No free fluid, free air or adenopathy. The aorta and iliac vessels are calcified no aneurysm.  No acute bony abnormality or focal bone lesion.  IMPRESSION: Large right perinephric hematoma, approximately 12 x 10 cm. This displaces the right kidney out of the right renal fossa. No hydronephrosis.  Sigmoid diverticulosis.  These results were called by telephone at the time of interpretation on 09/08/2014 at 3:00 pm to [facility] , who verbally acknowledged these results.   Electronically Signed   By: Rolm Baptise M.D.   On: 09/08/2014 15:01   US Biopsy  09/08/2014   CLINICAL DATA:  Chronic renal   insufficiency, stage III.  COMPARISON:  Ultrasound 08/20/2014  EXAM: ULTRASOUND GUIDED RENAL CORE BIOPSY  TECHNIQUE: The procedure, risks (including but not limited to bleeding, infection, organ damage ), benefits, and alternatives were explained to the patient. Questions regarding the procedure  were encouraged and answered. The patient understands and consents to the procedure. Survey ultrasound was performed and an appropriate skin entry site was localized. Site was marked, prePped with Betadine, draped in usual sterile fashion, infiltrated locally with 1% lidocaine. Intravenous Fentanyl  and Versed were administered as conscious sedation during continuous cardiorespiratory monitoring by the radiology RN with a total moderate sedation time of 6 minutes.  Under real time ultrasound guidance, a 15 gauge trocar needle was advanced to the margin of the lower pole of the right kidney. Three 16 gauge coaxial core samples were obtained. The guide needle removed. The core samples were submitted to pathology. Postprocedure scans demonstrate no immediate complication. The patient tolerated procedure well.  COMPLICATIONS: COMPLICATIONS none  IMPRESSION: 1. Technically successful ultrasound-guided core renal biopsy , right lower pole.   Electronically Signed   By: Lucrezia Europe M.D.   On: 09/08/2014 11:52    Labs:  CBC:  Recent Labs  03/02/14 1227 08/18/14 1537 09/08/14 0926 09/08/14 1333 09/08/14 2321 09/09/14 0417  WBC 8.1 5.8 5.1 6.0  --   --   HGB 10.4* 9.5* 9.2* 7.8* 8.3* 7.8*  HCT 29.6* 27.8* 26.7* 22.6* 24.0* 22.9*  PLT 273 194 164 197  --   --     COAGS:  Recent Labs  09/08/14 0926  INR 1.26  APTT 43*    BMP:  Recent Labs  01/21/14 1129 02/25/14 0959 03/02/14 1227 06/30/14 1528 08/18/14 1537  NA 139 144 141 141 142  K 5.8* 4.9 5.0 4.2 3.8  CL 111 115* 113* 109 110  CO2 17* 18* 18* 18* 19  GLUCOSE 104* 95 92 94 120*  BUN 40* 27* 26* 30* 44*  CALCIUM 9.9 9.0 9.3 9.3 9.0  CREATININE 2.49* 2.56* 2.38* 2.71* 2.97*  GFRNONAA 27* 26* 28* 24*  --   GFRAA 31* 30* 33* 28*  --     LIVER FUNCTION TESTS:  Recent Labs  01/21/14 1129 02/25/14 0959 03/02/14 1227 06/30/14 1528 08/18/14 1537  BILITOT 0.3 0.3 0.3 0.3  --   AST 43* 17 17 18   --   ALT 67* 10 11 11   --   ALKPHOS 60 62 66 71  --   PROT 7.4 6.5 6.5 6.6  --   ALBUMIN 4.5 3.7 3.8 4.0 4.1    Assessment and Plan:  Random renal bx in IR 6/29 Admitted for obs after post procedure Rt renal hematoma TRH has consulted Pt doing well this am Probable dc today Will report to Dr  Pascal Lux   Signed: Monia Sabal A 09/09/2014, 7:29 AM   I spent a total of 15 Minutes in face to face in clinical consultation/evaluation, greater than 50% of which was counseling/coordinating care for  post procedure rt renal hematoma

## 2014-09-09 NOTE — Discharge Summary (Signed)
Patient ID: Jose Garrett MRN: [id number] DOB/AGE: Nov 21, 1950 64 y.o.  Admit date: 09/08/2014 Discharge date: 09/09/2014  Admission Diagnoses: hematuria; proteinuria                                          Increasing renal functions  Discharge Diagnoses:  Principal Problem:   Renal hematoma, right Active Problems:   Essential hypertension, benign   Hypertriglyceridemia   Chronic renal disease, stage III   Chronic diastolic heart failure, NYHA class 1   Acute blood loss anemia   Discharged Condition: stable  Hospital Course: Pt presented as OP for Random renal bx per Dr Jose Garrett regarding hematuria; proteinuria and increasing renal functions.  Bx was performed in Radiology using US guidance by Dr Jose Garrett.  Procedure was without complications. Pt sent to [facility] for 3 hr recovery.  RN called to report complaint of pain and noted "puffiness" at site. Pt was examined by IR PA. CBC ordered with noted drop in Hg from 9.2 to 7.8. Discussed with Dr Jose Garrett; then ordered CT Ab/Pelvis w/o Cx:  Revealed large Rt renal hematoma. Pt was asymptomatic. Decided to keep overnight for observation and consulted TRH for help with medical management. Transfusion 1 unit PRBCs overnight. Pt feels great today. Hg now 8.1; denied sob; denies N/V; has eaten without issue; slept well; urinating well; passing gas. He has ambulated in room. I have seen and examined pt. Discussed with Dr Jose Garrett Plan for dc to home now. I called Dr Jose Garrett office. They will call pt for bx results. RN will let pt know if Dr Jose Garrett will need to see pt in office regardinh Rt Renal hematoma. Pt to see Dr Jose Garrett next week. Continue all home meds. Pt has good understanding of dc instuctions   Consults: TR[facility]  Significant Diagnostic Studies:  [place] Rt renal; CT Abd; Pelvis w/o Cx  Treatments: Random Renal Bx  Discharge Exam: Blood pressure 163/57, pulse 64, temperature 98.2 F (36.8 C), temperature source  Oral, resp. rate 19, height 5\' 8"  (1.727 m), weight 180 lb (81.647 kg), SpO2 97 %.  Physical Exam  Constitutional: He appears well-nourished.  afeb VSS In NAD  Cardiovascular: Normal rate and regular rhythm.  Pulmonary/Chest: Effort normal and breath sounds normal.  Abdominal: Soft. Bowel sounds are normal.  Rt flank without hematoma Much less soreness to palpate Site clean and dry  Nursing note and vitals reviewed.  UOP good; clear yellow Hg 8.1; Hct 23.1  Results for orders placed or performed during the hospital encounter of 09/08/14  Hemoglobin and hematocrit, blood  Result Value Ref Range   Hemoglobin 8.3 (L) 13.0 - 17.0 g/dL   HCT 24.0 (L) 39.0 - 52.0 %  Hemoglobin and hematocrit, blood  Result Value Ref Range   Hemoglobin 7.8 (L) 13.0 - 17.0 g/dL   HCT 22.9 (L) 39.0 - 52.0 %  Hemoglobin and hematocrit, blood  Result Value Ref Range   Hemoglobin 8.1 (L) 13.0 - 17.0 g/dL   HCT 23.1 (L) 39.0 - 52.0 %  Type and screen  Result Value Ref Range   ABO/RH(D) O POS    Antibody Screen NEG    Sample Expiration 09/11/2014    Unit Number [id number]    Blood Component Type RED CELLS,LR    Unit division 00    Status of Unit ISSUED,FINAL    Transfusion Status OK TO TRANSFUSE  Crossmatch Result Compatible   Prepare RBC  Result Value Ref Range   Order Confirmation ORDER PROCESSED BY BLOOD BANK   ABO/Rh  Result Value Ref Range   ABO/RH(D) O POS      Disposition: Rt renal bx 6/29 in Radiology with Dr Jose Garrett. Post procedure Rt renal hematoma. Overnight stay and 1 unit PRBCs. Pt feels great today. To continue all home meds. Follow with Dr Jose Garrett and Dr Jose Garrett. Call [phone] if questions or concerns. He leaves here today with good understanding of plan.  Discharge Instructions    Call MD for:  difficulty breathing, headache or visual disturbances    Complete by:  As directed      Call MD for:  extreme fatigue    Complete by:  As directed      Call MD  for:  hives    Complete by:  As directed      Call MD for:  persistant dizziness or light-headedness    Complete by:  As directed      Call MD for:  persistant nausea and vomiting    Complete by:  As directed      Call MD for:  redness, tenderness, or signs of infection (pain, swelling, redness, odor or green/yellow discharge around incision site)    Complete by:  As directed      Call MD for:  severe uncontrolled pain    Complete by:  As directed      Call MD for:  temperature >100.4    Complete by:  As directed      Diet - low sodium heart healthy    Complete by:  As directed      Discharge instructions    Complete by:  As directed   follow up with Dr Jose Garrett for bx result; pt to see primary Dr Jose Garrett next week     Discharge wound care:    Complete by:  As directed   Keep new band aid on Rt bx site daily x 1 week     Increase activity slowly    Complete by:  As directed      Lifting restrictions    Complete by:  As directed   No lifting over 10 lbs x 1 week            Medication List    TAKE these medications        amLODipine 10 MG tablet  Commonly known as:  NORVASC  Take 1 tablet (10 mg total) by mouth daily.     furosemide 40 MG tablet  Commonly known as:  LASIX  Take 40 mg by mouth 2 (two) times daily.     gemfibrozil 600 MG tablet  Commonly known as:  LOPID  Take 1 tablet (600 mg total) by mouth 2 (two) times daily before a meal.     hydrALAZINE 50 MG tablet  Commonly known as:  APRESOLINE  Take 1 tablet (50 mg total) by mouth 3 (three) times daily.     metoprolol 100 MG tablet  Commonly known as:  LOPRESSOR  Take 1 tablet (100 mg total) by mouth 2 (two) times daily.     vitamin E 100 UNIT capsule  Take 100 Units by mouth every other day.          Signed: Tremar Garrett A 09/09/2014, 11:00 AM   I have spent Less Than 30 Minutes discharging Jose Garrett.

## 2014-09-09 NOTE — Progress Notes (Signed)
[name] to be D/C'd Home per MD order.  Discussed with the patient and all questions fully answered.  VSS, Biopsy site has adhesive bandage in place.  IV catheter discontinued intact. Site without signs and symptoms of complications. Dressing and pressure applied.  An After Visit Summary was printed and given to the patient.  D/c education completed with patient/family including follow up instructions, medication list, d/c activities limitations if indicated, with other d/c instructions as indicated by MD - patient able to verbalize understanding, all questions fully answered.   Patient instructed to return to ED, call 911, or call MD for any changes in condition.   Patient to be escorted via [place], and D/C home via private auto.  [name] 09/09/2014 11:40 AM

## 2014-09-10 ENCOUNTER — Telehealth: Payer: Self-pay | Admitting: *Deleted

## 2014-09-10 NOTE — Telephone Encounter (Signed)
Patient called in stating he needs labs drawn for kidney specialist. No future lab orders in Epic Patient instructed to bring requested labs written on Rx pad and signed by Specialist These can then be drawn by [facility]

## 2014-09-15 ENCOUNTER — Ambulatory Visit: Payer: Self-pay | Attending: Internal Medicine | Admitting: *Deleted

## 2014-09-15 VITALS — BP 160/56 | HR 82 | Temp 100.1°F | Resp 20 | Ht 68.5 in | Wt 194.6 lb

## 2014-09-15 DIAGNOSIS — N183 Chronic kidney disease, stage 3 unspecified: Secondary | ICD-10-CM

## 2014-09-15 DIAGNOSIS — I10 Essential (primary) hypertension: Secondary | ICD-10-CM

## 2014-09-15 LAB — CBC WITH DIFFERENTIAL/PLATELET
Basophils Absolute: 0 10*3/uL (ref 0.0–0.1)
Basophils Relative: 0 % (ref 0–1)
Eosinophils Absolute: 0 10*3/uL (ref 0.0–0.7)
Eosinophils Relative: 0 % (ref 0–5)
HEMATOCRIT: 21.2 % — AB (ref 39.0–52.0)
Hemoglobin: 7.3 g/dL — ABNORMAL LOW (ref 13.0–17.0)
LYMPHS ABS: 1.2 10*3/uL (ref 0.7–4.0)
LYMPHS PCT: 16 % (ref 12–46)
MCH: 29.8 pg (ref 26.0–34.0)
MCHC: 34.4 g/dL (ref 30.0–36.0)
MCV: 86.5 fL (ref 78.0–100.0)
MONOS PCT: 13 % — AB (ref 3–12)
MPV: 8.9 fL (ref 8.6–12.4)
Monocytes Absolute: 1 10*3/uL (ref 0.1–1.0)
NEUTROS PCT: 71 % (ref 43–77)
Neutro Abs: 5.3 10*3/uL (ref 1.7–7.7)
Platelets: 195 10*3/uL (ref 150–400)
RBC: 2.45 MIL/uL — AB (ref 4.22–5.81)
RDW: 14.3 % (ref 11.5–15.5)
WBC: 7.4 10*3/uL (ref 4.0–10.5)

## 2014-09-15 LAB — RENAL FUNCTION PANEL
Albumin: 3.7 g/dL (ref 3.5–5.2)
BUN: 36 mg/dL — ABNORMAL HIGH (ref 6–23)
CALCIUM: 8.6 mg/dL (ref 8.4–10.5)
CO2: 16 mEq/L — ABNORMAL LOW (ref 19–32)
Chloride: 108 mEq/L (ref 96–112)
Creat: 3.49 mg/dL — ABNORMAL HIGH (ref 0.50–1.35)
GLUCOSE: 113 mg/dL — AB (ref 70–99)
PHOSPHORUS: 3.8 mg/dL (ref 2.3–4.6)
POTASSIUM: 4.3 meq/L (ref 3.5–5.3)
SODIUM: 143 meq/L (ref 135–145)

## 2014-09-15 NOTE — Progress Notes (Signed)
Patient presents for BP check  States s/p right kidney biopsy on 09/08/14. Stayed overnight due to decreased hgb and received 1 unit of blood States he's had no energy since; States difficulty finding energy to walk Patient comes with order for CBC, platelets & aut diff and renal function panel from Dr. Clover Mealy from [place]. Patient to learn when f/u with Nephrologist will be when receives biopsy results Med list reviewed; states taking all meds as directed Trying to follow low sodium diet Patient denies headaches, blurred vision, SHOB, chest pain    Filed Vitals:   09/15/14 1512  BP: 160/56  Pulse: 82  Temp: 100.1 F (37.8 C)  Resp: 20     Per PCP: Draw labs as listed above No changes to meds at this time  Patient advised to call for med refills at least 7 days before running out so as not to go without.  Patient aware that he is to f/u with PCP 3 months from last visit (Due 09/29/14)

## 2014-09-16 ENCOUNTER — Telehealth: Payer: Self-pay | Admitting: Internal Medicine

## 2014-09-16 ENCOUNTER — Other Ambulatory Visit: Payer: Self-pay | Admitting: Nephrology

## 2014-09-16 ENCOUNTER — Telehealth: Payer: Self-pay | Admitting: *Deleted

## 2014-09-16 DIAGNOSIS — S37011A Minor contusion of right kidney, initial encounter: Secondary | ICD-10-CM

## 2014-09-16 NOTE — Telephone Encounter (Signed)
[place] Appointment on July 11 at 11:30 arriving 15 min early  Drink 16oz of water and do not void  Pt aware

## 2014-09-16 NOTE — Telephone Encounter (Signed)
[facility] stated she already know about results

## 2014-09-16 NOTE — Telephone Encounter (Signed)
Received fax from [facility], Dr. [name]  Patient's PCP is Dr. Annitta Needs but his care is being transitioned to me  [place] of kidneys needed to r/o obstruction US ordered

## 2014-09-16 NOTE — Telephone Encounter (Signed)
-----   Message from Lorayne Marek, MD sent at 09/16/2014  1:06 PM EDT ----- Patient has history of renal hematoma, noticed drop in hemoglobin as well as worsening renal function, please let patient know about these results ( apparently patient is scheduled for renal US tomorrow) also call  his kidney doctor office and relay the test results.

## 2014-09-17 ENCOUNTER — Telehealth: Payer: Self-pay | Admitting: *Deleted

## 2014-09-17 ENCOUNTER — Encounter (HOSPITAL_COMMUNITY): Payer: Self-pay

## 2014-09-17 ENCOUNTER — Ambulatory Visit (HOSPITAL_COMMUNITY)
Admission: RE | Admit: 2014-09-17 | Discharge: 2014-09-17 | Disposition: A | Discharge status: Home / Self Care | Payer: Self-pay | Source: Ambulatory Visit | Attending: Family Medicine | Admitting: Family Medicine

## 2014-09-17 DIAGNOSIS — X58XXXA Exposure to other specified factors, initial encounter: Secondary | ICD-10-CM | POA: Insufficient documentation

## 2014-09-17 DIAGNOSIS — S37011A Minor contusion of right kidney, initial encounter: Secondary | ICD-10-CM | POA: Insufficient documentation

## 2014-09-17 NOTE — Telephone Encounter (Signed)
LVM to return call.

## 2014-09-17 NOTE — Telephone Encounter (Signed)
-----   Message from [name], MD sent at 09/17/2014 11:20 AM EDT ----- Persistent L perinephric hematoma unchanged from prior CT

## 2014-09-17 NOTE — Telephone Encounter (Signed)
-----   Message from Lorayne Marek, MD sent at 09/16/2014  1:06 PM EDT ----- Patient has history of renal hematoma, noticed drop in hemoglobin as well as worsening renal function, please let patient know about these results ( apparently patient is scheduled for renal US tomorrow) also call  his kidney doctor office and relay the test results.

## 2014-09-20 ENCOUNTER — Ambulatory Visit (HOSPITAL_COMMUNITY): Payer: No Typology Code available for payment source

## 2014-09-22 ENCOUNTER — Telehealth: Payer: Self-pay | Admitting: Family Medicine

## 2014-09-22 ENCOUNTER — Other Ambulatory Visit: Payer: Self-pay | Admitting: *Deleted

## 2014-09-22 MED ORDER — AMLODIPINE BESYLATE 10 MG PO TABS: 10.0000 mg | ORAL_TABLET | Freq: Every day | ORAL | Status: DC

## 2014-09-22 NOTE — Telephone Encounter (Signed)
Rx send to [facility] parmacy, Pt notified

## 2014-09-22 NOTE — Telephone Encounter (Signed)
Patient called requesting medication refill for amLODipine (NORVASC) 10 MG tablet. Patient uses [facility] pharmacy, please f/u

## 2014-09-23 ENCOUNTER — Telehealth: Payer: Self-pay | Admitting: Hematology

## 2014-09-23 NOTE — Telephone Encounter (Signed)
New patient appt-s/w Jose Garrett @ [facility] and gave np appt for 07/15 @ 9:30 w/Dr. [name] Referring Dr. [name] Dx- Abnormal Protein on renal biopsy and bloodwork

## 2014-09-24 ENCOUNTER — Ambulatory Visit (HOSPITAL_BASED_OUTPATIENT_CLINIC_OR_DEPARTMENT_OTHER): Payer: Self-pay

## 2014-09-24 ENCOUNTER — Ambulatory Visit: Payer: Self-pay

## 2014-09-24 ENCOUNTER — Encounter: Payer: Self-pay | Admitting: Hematology

## 2014-09-24 ENCOUNTER — Telehealth: Payer: Self-pay | Admitting: Hematology

## 2014-09-24 ENCOUNTER — Ambulatory Visit (HOSPITAL_BASED_OUTPATIENT_CLINIC_OR_DEPARTMENT_OTHER): Payer: Self-pay | Admitting: Hematology

## 2014-09-24 ENCOUNTER — Ambulatory Visit (HOSPITAL_COMMUNITY)
Admission: RE | Admit: 2014-09-24 | Discharge: 2014-09-24 | Disposition: A | Discharge status: Home / Self Care | Payer: Self-pay | Source: Ambulatory Visit | Attending: Hematology | Admitting: Hematology

## 2014-09-24 ENCOUNTER — Other Ambulatory Visit (HOSPITAL_COMMUNITY)
Admission: RE | Admit: 2014-09-24 | Discharge: 2014-09-24 | Disposition: A | Discharge status: Home / Self Care | Payer: Self-pay | Source: Ambulatory Visit | Attending: Hematology | Admitting: Hematology

## 2014-09-24 VITALS — BP 171/61 | HR 55 | Temp 98.3°F | Resp 16

## 2014-09-24 VITALS — BP 192/63 | HR 89 | Temp 98.0°F | Resp 18 | Ht 68.5 in | Wt 192.0 lb

## 2014-09-24 DIAGNOSIS — R233 Spontaneous ecchymoses: Secondary | ICD-10-CM

## 2014-09-24 DIAGNOSIS — D638 Anemia in other chronic diseases classified elsewhere: Secondary | ICD-10-CM

## 2014-09-24 DIAGNOSIS — E851 Neuropathic heredofamilial amyloidosis: Secondary | ICD-10-CM | POA: Insufficient documentation

## 2014-09-24 DIAGNOSIS — R238 Other skin changes: Secondary | ICD-10-CM

## 2014-09-24 DIAGNOSIS — E858 Other amyloidosis: Secondary | ICD-10-CM

## 2014-09-24 DIAGNOSIS — D649 Anemia, unspecified: Secondary | ICD-10-CM

## 2014-09-24 DIAGNOSIS — E8581 Light chain (AL) amyloidosis: Secondary | ICD-10-CM

## 2014-09-24 DIAGNOSIS — Z808 Family history of malignant neoplasm of other organs or systems: Secondary | ICD-10-CM

## 2014-09-24 LAB — CBC & DIFF AND RETIC
BASO%: 0.3 % (ref 0.0–2.0)
BASOS ABS: 0 10*3/uL (ref 0.0–0.1)
EOS ABS: 0.2 10*3/uL (ref 0.0–0.5)
EOS%: 2.8 % (ref 0.0–7.0)
HEMATOCRIT: 21.8 % — AB (ref 38.4–49.9)
HGB: 7.1 g/dL — ABNORMAL LOW (ref 13.0–17.1)
IMMATURE RETIC FRACT: 8.2 % (ref 3.00–10.60)
LYMPH%: 16.2 % (ref 14.0–49.0)
MCH: 28.5 pg (ref 27.2–33.4)
MCHC: 32.6 g/dL (ref 32.0–36.0)
MCV: 87.6 fL (ref 79.3–98.0)
MONO#: 0.6 10*3/uL (ref 0.1–0.9)
MONO%: 9.1 % (ref 0.0–14.0)
NEUT%: 71.6 % (ref 39.0–75.0)
NEUTROS ABS: 4.4 10*3/uL (ref 1.5–6.5)
PLATELETS: 329 10*3/uL (ref 140–400)
RBC: 2.49 10*6/uL — ABNORMAL LOW (ref 4.20–5.82)
RDW: 14 % (ref 11.0–14.6)
Retic %: 2.09 % — ABNORMAL HIGH (ref 0.80–1.80)
Retic Ct Abs: 52.04 10*3/uL (ref 34.80–93.90)
WBC: 6.2 10*3/uL (ref 4.0–10.3)
lymph#: 1 10*3/uL (ref 0.9–3.3)

## 2014-09-24 LAB — IRON AND TIBC CHCC
%SAT: 12 % — ABNORMAL LOW (ref 20–55)
IRON: 34 ug/dL — AB (ref 42–163)
TIBC: 284 ug/dL (ref 202–409)
UIBC: 250 ug/dL (ref 117–376)

## 2014-09-24 LAB — COMPREHENSIVE METABOLIC PANEL (CC13)
ALT: 16 U/L (ref 0–55)
ANION GAP: 9 meq/L (ref 3–11)
AST: 19 U/L (ref 5–34)
Albumin: 2.9 g/dL — ABNORMAL LOW (ref 3.5–5.0)
Alkaline Phosphatase: 152 U/L — ABNORMAL HIGH (ref 40–150)
BUN: 33 mg/dL — ABNORMAL HIGH (ref 7.0–26.0)
CHLORIDE: 115 meq/L — AB (ref 98–109)
CO2: 18 meq/L — AB (ref 22–29)
Calcium: 9.4 mg/dL (ref 8.4–10.4)
Creatinine: 3.2 mg/dL (ref 0.7–1.3)
EGFR: 20 mL/min/{1.73_m2} — ABNORMAL LOW (ref >90)
Glucose: 111 mg/dl (ref 70–140)
POTASSIUM: 3.9 meq/L (ref 3.5–5.1)
Sodium: 142 mEq/L (ref 136–145)
Total Bilirubin: 0.38 mg/dL (ref 0.20–1.20)
Total Protein: 6.6 g/dL (ref 6.4–8.3)

## 2014-09-24 LAB — FERRITIN CHCC: Ferritin: 401 ng/ml — ABNORMAL HIGH (ref 22–316)

## 2014-09-24 LAB — HOLD TUBE, BLOOD BANK

## 2014-09-24 LAB — ABO/RH: ABO/RH(D): O POS

## 2014-09-24 LAB — TROPONIN I: Troponin I: <0.03 ng/mL (ref <0.031)

## 2014-09-24 MED ORDER — SODIUM CHLORIDE 0.9 % IV SOLN
250.0000 mL | Freq: Once | INTRAVENOUS | Status: AC
  Administered 2014-09-24: 250 mL via INTRAVENOUS

## 2014-09-24 MED ORDER — FUROSEMIDE 10 MG/ML IJ SOLN
20.0000 mg | Freq: Once | INTRAMUSCULAR | Status: AC
  Administered 2014-09-24: 20 mg via INTRAVENOUS

## 2014-09-24 MED ORDER — ACETAMINOPHEN 325 MG PO TABS
ORAL_TABLET | ORAL | Status: AC
  Filled 2014-09-24: qty 2

## 2014-09-24 MED ORDER — FUROSEMIDE 10 MG/ML IJ SOLN
INTRAMUSCULAR | Status: AC
  Filled 2014-09-24: qty 2

## 2014-09-24 MED ORDER — DIPHENHYDRAMINE HCL 25 MG PO CAPS
ORAL_CAPSULE | ORAL | Status: AC
  Filled 2014-09-24: qty 1

## 2014-09-24 MED ORDER — DIPHENHYDRAMINE HCL 50 MG/ML IJ SOLN
25.0000 mg | Freq: Once | INTRAMUSCULAR | Status: AC
  Administered 2014-09-24: 25 mg via INTRAVENOUS

## 2014-09-24 MED ORDER — ACETAMINOPHEN 325 MG PO TABS
650.0000 mg | ORAL_TABLET | Freq: Once | ORAL | Status: AC
  Administered 2014-09-24: 650 mg via ORAL

## 2014-09-24 NOTE — Patient Instructions (Signed)

## 2014-09-24 NOTE — Progress Notes (Signed)
Jose Garrett    CONSULT NOTE  Patient Care Team: [name], MD as PCP - General (Family Medicine) [name], MD as Consulting Physician (Nephrology)  CHIEF COMPLAINTS/PURPOSE OF CONSULTATION:   AL Amyloidosis noted on renal biopsy. Anemia  HISTORY OF PRESENTING ILLNESS:  Jose Garrett is a very pleasant 64 yo caucasian male with below mentioned past medical who has been referred for evaluation and management of his recently noted AL (Lambda light chain) renal amyloidosis with progressively worsening renal function and anemia.  Patient notes that he understands that he is here for the visit due to the findings of an abnormal protein in his kidney. He notes that is kidney function was gradually worsening from creatinine 2.17 in 11/2013 to recent 3.49 and he also developed progressive anemia with hgb going from normal 13.7 in 11/2013 down to 9.5 on 08/18/2014.  He also reports increasing pedal edema edema over this period which has been somewhat controlled by lasix. He has a h/o diastolic CHF from ECHO in 11/2013. Does not have an established cardiologist per patient.  His clinical findings led to a kidney biopsy on 09/08/2014 for further workup and unfortunately resulted in a large rt sided perinephric hematoma leading to hospitalization and PRBC transfusion.  He notes that he has been significantly fatigued since then.  He has a f/u renal ultrasound on 09/17/2014 that showed no significant change in his per-nephric hematoma.  He notes that his fatigue is significantly limiting his activities and notes increased DOE as well. Still has some pedal edema. Informed consent obtained for PRBC transfusion today in the infusion center.  No chest pain. No focal bone pains. No FND. No abdominal or back pain at this time. No overt hematuria. No other evidence of overt bleeding.  Has noted some increased bruisability in the last few months without any specific new medications.   MEDICAL HISTORY:    Past Medical History  Diagnosis Date  . Hypertension   . Hyperlipidemia   . History of blood transfusion 09/08/2014    "got hematoma after renal biopsy & HgB dropped"  . Anemia   . Family history of adverse reaction to anesthesia     "my daughter can't take certain anesthesia agents" (09/08/2014)  . Enlarged heart   . Chronic diastolic CHF (congestive heart failure), NYHA class 1     /hospital problem list 09/08/2014  . Renal disorder     "there is a spot on my kidney" (09/08/2014)  . Chronic renal disease, stage III     /hospital problem list 09/08/2014    SURGICAL HISTORY: Past Surgical History  Procedure Laterality Date  . Renal biopsy, percutaneous Right 09/08/2014  . Tonsillectomy  ~ 1960  . Laparoscopic cholecystectomy  03/2011    SOCIAL HISTORY: History   Social History  . Marital Status: Divorced    Spouse Name: N/A  . Number of Children: N/A  . Years of Education: N/A   Occupational History  . Not on file.   Social History Main Topics  . Smoking status: Former Smoker -- 2.00 packs/day for 40 years    Types: Cigarettes    Quit date: 03/19/2011  . Smokeless tobacco: Never Used  . Alcohol Use: No  . Drug Use: No     Comment: 09/08/2014 "I have used marijuana till the 1990's". Notes that he quit 15 yrs ago  . Sexual Activity: Not Currently   Other Topics Concern  . 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 Lives with his daughter and her finance.  FAMILY HISTORY: Family History  Problem Relation Age of Onset  . Hypertension Mother   . Diabetes Mother   . Heart disease Mother   . Skin cancer Mother   . Hypertension Father   . Diabetes Father   . Diabetes Sister     ALLERGIES:  has No Known Allergies.  MEDICATIONS:  Current Outpatient Prescriptions  Medication Sig Dispense Refill  . amLODipine (NORVASC) 10 MG tablet Take 1 tablet (10 mg total) by mouth daily. 30 tablet 3  .  furosemide (LASIX) 40 MG tablet Take 40 mg by mouth 2 (two) times daily.    Jose Garrett gemfibrozil (LOPID) 600 MG tablet Take 1 tablet (600 mg total) by mouth 2 (two) times daily before a meal. 90 tablet 2  . hydrALAZINE (APRESOLINE) 50 MG tablet Take 1 tablet (50 mg total) by mouth 3 (three) times daily. 120 tablet 2  . metoprolol (LOPRESSOR) 100 MG tablet Take 1 tablet (100 mg total) by mouth 2 (two) times daily. 30 tablet 3  . vitamin E 100 UNIT capsule Take 100 Units by mouth every other day.      No current facility-administered medications for this visit.    REVIEW OF SYSTEMS:   Constitutional: Denies fevers, chills or abnormal night sweats Eyes: Denies blurriness of vision, double vision or watery eyes Ears, nose, mouth, throat, and face: Denies mucositis or sore throat Respiratory: Denies cough, dyspnea or wheezes Cardiovascular: Denies palpitation, chest discomfort or lower extremity swelling Gastrointestinal:  Denies nausea, heartburn or change in bowel habits Skin: Denies abnormal skin rashes Lymphatics: Denies new lymphadenopathy or easy bruising Neurological:Denies numbness, tingling or new weaknesses Behavioral/Psych: Mood is stable, no new changes  All other systems were reviewed with the patient and are negative.   PHYSICAL EXAMINATION: ECOG PERFORMANCE STATUS: 2 - Symptomatic, <50% confined to bed  Filed Vitals:   09/24/14 0944  BP: 192/63  Pulse: 89  Temp: 98 F (36.7 C)  Resp: 18   Filed Weights   09/24/14 0944  Weight: 192 lb (87.091 kg)    GENERAL:alert, no distress , appears fatigued. SKIN: significant conjunctival pallor noted.  EYES: nl EOM, PERL OROPHARYNX:no exudate, no erythema and lips, buccal mucosa, and tongue normal  NECK: no JVD LYMPH:  no palpable lymphadenopathy in the cervical, axillary or inguinal LUNGS: clear to auscultation and percussion with normal breathing effort HEART: regular rate & rhythm and 2/6 SM aortic area ABDOMEN:abdomen soft,  non-tender and normal bowel sounds Musculoskeletal:b/l 2+ pedal edema PSYCH: alert & oriented x 3 with fluent speech NEURO: no focal motor/sensory deficits  LABORATORY DATA:  I have reviewed the data as listed Lab Results  Component Value Date   WBC 6.2 09/24/2014   HGB 7.1* 09/24/2014   HCT 21.8* 09/24/2014   MCV 87.6 09/24/2014   PLT 329 09/24/2014    Recent Labs  02/25/14 0959 03/02/14 1227 06/30/14 1528 08/18/14 1537 09/15/14 1539 09/24/14 1224  NA 144 141 141 142 143 142  K 4.9 5.0 4.2 3.8 4.3 3.9  CL 115* 113* 109 110 108  --   CO2 18* 18* 18* 19 16* 18*  GLUCOSE 95 92 94 120* 113* 111  BUN 27* 26* 30* 44* 36* 33.0*  CREATININE 2.56* 2.38* 2.71* 2.97* 3.49* 3.2*  CALCIUM 9.0 9.3 9.3 9.0 8.6 9.4  GFRNONAA 26* 28* 24*  --   --   --   GFRAA 30*  33* 28*  --   --   --   PROT 6.5 6.5 6.6  --   --  6.6  ALBUMIN 3.7 3.8 4.0 4.1 3.7 2.9*  AST _0 --   --  19  ALT _1 --   --  16  ALKPHOS 62 66 71  --   --  152*  BILITOT 0.3 0.3 0.3  --   --  0.38    RADIOGRAPHIC STUDIES: I have personally reviewed the radiological images as listed and agreed with the findings in the report. Ct Abdomen Pelvis Wo Contrast  09/08/2014   CLINICAL DATA:  Post right renal biopsy this morning with drop in hemoglobin. Question bleed.  EXAM: CT ABDOMEN AND PELVIS WITHOUT CONTRAST  TECHNIQUE: Multidetector CT imaging of the abdomen and pelvis was performed following the standard protocol without IV contrast.  COMPARISON:  None.  FINDINGS: Linear areas of atelectasis in the lung bases bilaterally. Heart is normal size. No effusions.  Prior cholecystectomy. Small low-density lesion within the right hepatic lobe, measuring 14 mm, most likely cysts. Pancreas, spleen, adrenals and left kidney are unremarkable. There is a large right perinephric hematoma, measuring approximately 12 x 10 cm. The right kidney is displaced anteriorly. No hydronephrosis bilaterally.  Stomach and small bowel are  unremarkable. Sigmoid diverticulosis. No active diverticulitis. No free fluid, free air or adenopathy. The aorta and iliac vessels are calcified no aneurysm.  No acute bony abnormality or focal bone lesion.  IMPRESSION: Large right perinephric hematoma, approximately 12 x 10 cm. This displaces the right kidney out of the right renal fossa. No hydronephrosis.  Sigmoid diverticulosis.  These results were called by telephone at the time of interpretation on 09/08/2014 at 3:00 pm to [facility] , who verbally acknowledged these results.   Electronically Signed   By: Rolm Baptise M.D.   On: 09/08/2014 15:01   US Renal  09/17/2014   CLINICAL DATA:  Renal hematoma.  EXAM: RENAL / URINARY TRACT ULTRASOUND COMPLETE  COMPARISON:  CT 09/08/2014.  FINDINGS: Right Kidney:  Length: 12.9 cm. Echogenicity within normal limits. No hydronephrosis visualized. Large perinephric complex fluid collection is again noted, similar appearance to prior CT of 09/08/2014. This is consistent with large perinephric hematoma previously identified. Blood flow noted to the right kidney .  Left Kidney:  Length: 11.6 cm. Echogenicity within normal limits. No mass or hydronephrosis visualized.  Bladder:  Appears normal for degree of bladder distention.  IMPRESSION: Persistent large right perinephric hematoma. No significant change from prior CT of 09/08/2014.   Electronically Signed   By: Marcello Moores  Register   On: 09/17/2014 09:06   US Biopsy  09/08/2014   CLINICAL DATA:  Chronic renal   insufficiency, stage III.  COMPARISON:  Ultrasound 08/20/2014  EXAM: ULTRASOUND GUIDED RENAL CORE BIOPSY  TECHNIQUE: The procedure, risks (including but not limited to bleeding, infection, organ damage ), benefits, and alternatives were explained to the patient. Questions regarding the procedure were encouraged and answered. The patient understands and consents to the procedure. Survey ultrasound was performed and an appropriate skin entry site was localized. Site  was marked, prePped with Betadine, draped in usual sterile fashion, infiltrated locally with 1% lidocaine. Intravenous Fentanyl and Versed were administered as conscious sedation during continuous cardiorespiratory monitoring by the radiology RN with a total moderate sedation time of 6 minutes.  Under real time ultrasound guidance, a 15 gauge trocar needle was advanced to the margin of the lower pole  of the right kidney. Three 16 gauge coaxial core samples were obtained. The guide needle removed. The core samples were submitted to pathology. Postprocedure scans demonstrate no immediate complication. The patient tolerated procedure well.  COMPLICATIONS: COMPLICATIONS none  IMPRESSION: 1. Technically successful ultrasound-guided core renal biopsy , right lower pole.   Electronically Signed   By: Lucrezia Europe M.D.   On: 09/08/2014 11:52    PATHOLOGY (09/08/2014)     ECHO 11/12/2013:  Study Conclusions  - Left ventricle: E/e&'>18.6 suggestive of elevated filling   pressures. The cavity size was normal. There was moderate   concentric hypertrophy. Systolic function was normal. The   estimated ejection fraction was in the range of 55% to 60%. Wall   motion was normal; there were no regional wall motion   abnormalities. There was an increased relative contribution of   atrial contraction to ventricular filling. Doppler parameters are   consistent with abnormal left ventricular relaxation (grade 1   diastolic dysfunction). - Aortic valve: Moderate thickening and calcification, consistent   with sclerosis. There was mild regurgitation. - Mitral valve: There was mild regurgitation.   ASSESSMENT & PLAN:    Mr Trenton Gammon is very pleasant 64 yo caucasian male with   1) AL Amyloidosis noted on recent renal biopsy. This certainly likely to be one of the factors in his worsening renal function in addition to the possibility of CKD from HTN.  SPEP with small amount of M-spike 0.3 (IFE not done) Plan -will need  to r/o a more aggressive underlying plasma cell dyscrasia like multiple myeloma. -rpt SPEP with IFE and QIG -UPEP with IFE -kappa/lambda free light chains in 24h urine sample -Bone marrow aspiration/biopsy CT guided with myeloma panel -ECHO to reassess heart function. Might need cardiology evaluation based on findings. -BNP and troponin as prognostic cardiac markers assuming potential cardiac AL involvement. -optimization of diuresis as per nephrology with close nephrology f/u. -will plan treatment based on above results - considering CyBorD vs VD 2) Symptomatic anemia - likely multifactorial - r/o plasma cell dyscrasia, anemia from CKD, acute blood loss anemia due to large perinephric hematoma related to recent renal bx on 09/08/2014. Hematoma noted to be stable on rpt US renal on 09/17/2014. Given patients symptoms from anemia, low likelihood of his hgb rapidly bouncing back, high predilection for rebleeding and likely need for rx for AL amyloidosis -all  justify PRBC transfusion at this time. Plan -he will be transfused 2 units of PRBCs today in infusion center with pre and post-lasix IV  RTC in 10-14 days with completion of all above workup. Continue close f/u with PCP and nephrology.   I appreciate the privilege of being a part of the medical care of this fantastic gentleman.  All questions were answered. The patient knows to call the clinic with any problems, questions or concerns. I spent 60 minutes counseling the patient face to face. The total time spent in the appointment was 80 minutes and more than 50% was on counseling.     Sullivan Lone, MD 09/24/2014 10:19 AM Hematology/Oncology [place].

## 2014-09-24 NOTE — Progress Notes (Signed)
Checked in new pt with no insurance.  Pt has filled out an application to apply for assistance thru the hospital.  I will process it and notify the pt of the result by mail.

## 2014-09-24 NOTE — Telephone Encounter (Signed)
Pt confirmed labs/ov per 07/15 POF, gave pt AVS and Calendar..... KJ, sent msg to add blood transfusion

## 2014-09-25 ENCOUNTER — Other Ambulatory Visit: Payer: Self-pay | Admitting: Radiology

## 2014-09-25 ENCOUNTER — Encounter: Payer: Self-pay | Admitting: Hematology

## 2014-09-25 DIAGNOSIS — R233 Spontaneous ecchymoses: Secondary | ICD-10-CM | POA: Insufficient documentation

## 2014-09-25 DIAGNOSIS — E8581 Light chain (AL) amyloidosis: Secondary | ICD-10-CM | POA: Insufficient documentation

## 2014-09-25 DIAGNOSIS — R238 Other skin changes: Secondary | ICD-10-CM | POA: Insufficient documentation

## 2014-09-25 DIAGNOSIS — D638 Anemia in other chronic diseases classified elsewhere: Secondary | ICD-10-CM | POA: Insufficient documentation

## 2014-09-26 LAB — TYPE AND SCREEN
ABO/RH(D): O POS
ANTIBODY SCREEN: NEGATIVE
Unit division: 0
Unit division: 0

## 2014-09-27 ENCOUNTER — Other Ambulatory Visit: Payer: Self-pay | Admitting: Radiology

## 2014-09-28 ENCOUNTER — Ambulatory Visit (HOSPITAL_COMMUNITY)
Admission: RE | Admit: 2014-09-28 | Discharge: 2014-09-28 | Disposition: A | Discharge status: Home / Self Care | Payer: Self-pay | Source: Ambulatory Visit | Attending: Hematology | Admitting: Hematology

## 2014-09-28 ENCOUNTER — Encounter (HOSPITAL_COMMUNITY): Payer: Self-pay

## 2014-09-28 DIAGNOSIS — E8581 Light chain (AL) amyloidosis: Secondary | ICD-10-CM

## 2014-09-28 DIAGNOSIS — D72822 Plasmacytosis: Secondary | ICD-10-CM | POA: Insufficient documentation

## 2014-09-28 DIAGNOSIS — E859 Amyloidosis, unspecified: Secondary | ICD-10-CM | POA: Insufficient documentation

## 2014-09-28 DIAGNOSIS — D649 Anemia, unspecified: Secondary | ICD-10-CM | POA: Insufficient documentation

## 2014-09-28 LAB — PROTIME-INR
INR: 1.21 (ref 0.00–1.49)
PROTHROMBIN TIME: 15.5 s — AB (ref 11.6–15.2)

## 2014-09-28 LAB — SPEP & IFE WITH QIG
ABNORMAL PROTEIN BAND1: 0.2 g/dL
ALBUMIN ELP: 3.1 g/dL — AB (ref 3.8–4.8)
ALPHA-1-GLOBULIN: 0.6 g/dL — AB (ref 0.2–0.3)
Alpha-2-Globulin: 0.8 g/dL (ref 0.5–0.9)
BETA GLOBULIN: 0.3 g/dL — AB (ref 0.4–0.6)
Beta 2: 0.4 g/dL (ref 0.2–0.5)
GAMMA GLOBULIN: 0.7 g/dL — AB (ref 0.8–1.7)
IGA: 138 mg/dL (ref 68–379)
IGG (IMMUNOGLOBIN G), SERUM: 674 mg/dL (ref 650–1600)
IgM, Serum: 123 mg/dL (ref 41–251)
Total Protein, Serum Electrophoresis: 5.9 g/dL — ABNORMAL LOW (ref 6.1–8.1)

## 2014-09-28 LAB — CBC
HEMATOCRIT: 29.3 % — AB (ref 39.0–52.0)
HEMOGLOBIN: 9.6 g/dL — AB (ref 13.0–17.0)
MCH: 28.7 pg (ref 26.0–34.0)
MCHC: 32.8 g/dL (ref 30.0–36.0)
MCV: 87.7 fL (ref 78.0–100.0)
Platelets: 357 10*3/uL (ref 150–400)
RBC: 3.34 MIL/uL — ABNORMAL LOW (ref 4.22–5.81)
RDW: 14.3 % (ref 11.5–15.5)
WBC: 7 10*3/uL (ref 4.0–10.5)

## 2014-09-28 LAB — KAPPA/LAMBDA LIGHT CHAINS
Kappa free light chain: 6.38 mg/dL — ABNORMAL HIGH (ref 0.33–1.94)
Kappa:Lambda Ratio: 1.25 (ref 0.26–1.65)
Lambda Free Lght Chn: 5.09 mg/dL — ABNORMAL HIGH (ref 0.57–2.63)

## 2014-09-28 LAB — FIBRINOGEN: Fibrinogen: 856 mg/dL — ABNORMAL HIGH (ref 204–475)

## 2014-09-28 LAB — BRAIN NATRIURETIC PEPTIDE: Brain Natriuretic Peptide: 190.5 pg/mL — ABNORMAL HIGH (ref 0.0–100.0)

## 2014-09-28 LAB — APTT
APTT: 44 s — AB (ref 24–37)
aPTT: 56 seconds — ABNORMAL HIGH (ref 24–37)

## 2014-09-28 LAB — THROMBIN TIME: THROMBIN TIME: 17.8 s (ref <21.0)

## 2014-09-28 LAB — SEDIMENTATION RATE: Sed Rate: 134 mm/hr — ABNORMAL HIGH (ref 0–20)

## 2014-09-28 LAB — PROTHROMBIN TIME
INR: 1.28 (ref <1.50)
Prothrombin Time: 16 seconds — ABNORMAL HIGH (ref 11.6–15.2)

## 2014-09-28 LAB — BONE MARROW EXAM

## 2014-09-28 MED ORDER — MIDAZOLAM HCL 2 MG/2ML IJ SOLN
INTRAMUSCULAR | Status: AC
Start: 2014-09-28 — End: 2014-09-28
  Filled 2014-09-28: qty 4

## 2014-09-28 MED ORDER — FENTANYL CITRATE (PF) 100 MCG/2ML IJ SOLN
INTRAMUSCULAR | Status: AC | PRN
  Administered 2014-09-28: 50 ug via INTRAVENOUS
  Administered 2014-09-28: 25 ug via INTRAVENOUS

## 2014-09-28 MED ORDER — FENTANYL CITRATE (PF) 100 MCG/2ML IJ SOLN
INTRAMUSCULAR | Status: AC
  Filled 2014-09-28: qty 2

## 2014-09-28 MED ORDER — SODIUM CHLORIDE 0.9 % IV SOLN
Freq: Once | INTRAVENOUS | Status: AC
  Administered 2014-09-28: 500 mL via INTRAVENOUS

## 2014-09-28 MED ORDER — MIDAZOLAM HCL 2 MG/2ML IJ SOLN
INTRAMUSCULAR | Status: AC | PRN
  Administered 2014-09-28: 0.5 mg via INTRAVENOUS
  Administered 2014-09-28: 1 mg via INTRAVENOUS
  Administered 2014-09-28: 0.5 mg via INTRAVENOUS

## 2014-09-28 NOTE — H&P (Signed)
HPI: Patient with amyloidosis on renal biopsy and SPEP with M spike. He has been seen by Dr. [name] 09/25/14 and scheduled today for image guided bone marrow biopsy.  The patient has had a H&P performed within the last 30 days, all history, medications, and exam have been reviewed. The patient denies any interval changes since the H&P.  Medications: Prior to Admission medications   Medication Sig Start Date End Date Taking? Authorizing Provider  amLODipine (NORVASC) 10 MG tablet Take 1 tablet (10 mg total) by mouth daily. Patient taking differently: Take 10 mg by mouth daily with lunch.  09/22/14  Yes Lorayne Marek, MD  furosemide (LASIX) 40 MG tablet Take 40 mg by mouth 2 (two) times daily.   Yes Historical Provider, MD  gemfibrozil (LOPID) 600 MG tablet Take 1 tablet (600 mg total) by mouth 2 (two) times daily before a meal. 08/12/14  Yes Deepak Advani, MD  hydrALAZINE (APRESOLINE) 50 MG tablet Take 1 tablet (50 mg total) by mouth 3 (three) times daily. 08/18/14  Yes Lorayne Marek, MD  metoprolol (LOPRESSOR) 100 MG tablet Take 1 tablet (100 mg total) by mouth 2 (two) times daily. 05/21/14  Yes Lorayne Marek, MD  vitamin E 100 UNIT capsule Take 100 Units by mouth every other day.    Yes Historical Provider, MD    Vital Signs: BP 174/66 mmHg  Pulse 70  Temp(Src) 97.6 F (36.4 C) (Oral)  Resp 18  Ht 5' 8.5" (1.74 m)  Wt 192 lb (87.091 kg)  BMI 28.77 kg/m2  SpO2 100%  Physical Exam  Constitutional: He is oriented to person, place, and time. No distress.  HENT:  Head: Normocephalic and atraumatic.  Cardiovascular: Normal rate and regular rhythm.  Exam reveals no gallop and no friction rub.   No murmur heard. Pulmonary/Chest: Effort normal and breath sounds normal. No respiratory distress. He has no wheezes. He has no rales.  Abdominal: He exhibits no distension. There is no tenderness.  Neurological: He is alert and oriented to person, place, and time.  Skin: Skin is warm and dry. He is  not diaphoretic.  Psychiatric: He has a normal mood and affect. His behavior is normal. Thought content normal.   Mallampati Score:  MD Evaluation Airway: WNL Heart: WNL Abdomen: WNL Chest/ Lungs: WNL ASA  Classification: 2 Mallampati/Airway Score: Two  Labs:  CBC:  Recent Labs  09/08/14 1333  09/09/14 1012 09/15/14 1539 09/24/14 1224 09/28/14 0659  WBC 6.0  --   --  7.4 6.2 7.0  HGB 7.8*  < > 8.1* 7.3* 7.1* 9.6*  HCT 22.6*  < > 23.1* 21.2* 21.8* 29.3*  PLT 197  --   --  195 329 357  < > = values in this interval not displayed.  COAGS:  Recent Labs  09/08/14 0926 09/24/14 1224 09/28/14 0659  INR 1.26 1.28 1.21  APTT 43* 56* 44*    BMP:  Recent Labs  01/21/14 1129 02/25/14 0959 03/02/14 1227 06/30/14 1528 08/18/14 1537 09/15/14 1539 09/24/14 1224  NA 139 144 141 141 142 143 142  K 5.8* 4.9 5.0 4.2 3.8 4.3 3.9  CL 111 115* 113* 109 110 108  --   CO2 17* 18* 18* 18* 19 16* 18*  GLUCOSE 104* 95 92 94 120* 113* 111  BUN 40* 27* 26* 30* 44* 36* 33.0*  CALCIUM 9.9 9.0 9.3 9.3 9.0 8.6 9.4  CREATININE 2.49* 2.56* 2.38* 2.71* 2.97* 3.49* 3.2*  GFRNONAA 27* 26* 28* 24*  --   --   --  GFRAA 31* 30* 33* 28*  --   --   --     LIVER FUNCTION TESTS:  Recent Labs  02/25/14 0959 03/02/14 1227 06/30/14 1528 08/18/14 1537 09/15/14 1539 09/24/14 1224  BILITOT 0.3 0.3 0.3  --   --  0.38  AST $Re'17 17 18  'eqN$ --   --  19  ALT $Re'10 11 11  'qlh$ --   --  16  ALKPHOS 62 66 71  --   --  152*  PROT 6.5 6.5 6.6  --   --  6.6  ALBUMIN 3.7 3.8 4.0 4.1 3.7 2.9*    Assessment/Plan:  Amyloidosis on renal biopsy M spike Seen by Dr. [name] 09/25/14 Scheduled today for image guided bone marrow biopsy with sedation The patient has been NPO, no blood thinners taken, labs and vitals have been reviewed. Risks and Benefits discussed with the patient including, but not limited to bleeding, infection, damage to adjacent structures or low yield requiring additional tests. All of the  patient's questions were answered, patient is agreeable to proceed. Consent signed and in chart.   SignedHedy Jacob 09/28/2014, 8:32 AM

## 2014-09-28 NOTE — Procedures (Signed)
Interventional Radiology Procedure Note  Procedure: CT guided aspirate and core biopsy of right posterior iliac bone Complications: None Recommendations: - Bedrest supine  - OTC for post-op soreness - Follow biopsy results  Signed,  Dulcy Fanny. Earleen Newport, DO

## 2014-09-28 NOTE — Discharge Instructions (Signed)
Conscious Sedation Sedation is the use of medicines to promote relaxation and relieve discomfort and anxiety. Conscious sedation is a type of sedation. Under conscious sedation you are less alert than normal but are still able to respond to instructions or stimulation. Conscious sedation is used during short medical and dental procedures. It is milder than deep sedation or general anesthesia and allows you to return to your regular activities sooner.  LET [facility] CARE PROVIDER KNOW ABOUT:   Any allergies you have.  All medicines you are taking, including vitamins, herbs, eye drops, creams, and over-the-counter medicines.  Use of steroids (by mouth or creams).  Previous problems you or members of your family have had with the use of anesthetics.  Any blood disorders you have.  Previous surgeries you have had.  Medical conditions you have.  Possibility of pregnancy, if this applies.  Use of cigarettes, alcohol, or illegal drugs. RISKS AND COMPLICATIONS Generally, this is a safe procedure. However, as with any procedure, problems can occur. Possible problems include:  Oversedation.  Trouble breathing on your own. You may need to have a breathing tube until you are awake and breathing on your own.  Allergic reaction to any of the medicines used for the procedure. BEFORE THE PROCEDURE  You may have blood tests done. These tests can help show how well your kidneys and liver are working. They can also show how well your blood clots.  A physical exam will be done.  Only take medicines as directed by your health care provider. You may need to stop taking medicines (such as blood thinners, aspirin, or nonsteroidal anti-inflammatory drugs) before the procedure.   Do not eat or drink at least 6 hours before the procedure or as directed by your health care provider.  Arrange for a responsible adult, family member, or friend to take you home after the procedure. He or she should stay  with you for at least 24 hours after the procedure, until the medicine has worn off. PROCEDURE   An intravenous (IV) catheter will be inserted into one of your veins. Medicine will be able to flow directly into your body through this catheter. You may be given medicine through this tube to help prevent pain and help you relax.  The medical or dental procedure will be done. AFTER THE PROCEDURE  You will stay in a recovery area until the medicine has worn off. Your blood pressure and pulse will be checked.   Depending on the procedure you had, you may be allowed to go home when you can tolerate liquids and your pain is under control. Document Released: 11/21/2000 Document Revised: 03/03/2013 Document Reviewed: 11/03/2012 [facility] Patient Information 2015 [place], [place]. This information is not intended to replace advice given to you by your health care provider. Make sure you discuss any questions you have with your health care provider. Bone Marrow Aspiration, Bone Marrow Biopsy Care After Read the instructions outlined below and refer to this sheet in the next few weeks. These discharge instructions provide you with general information on caring for yourself after you leave the hospital. Your caregiver may also give you specific instructions. While your treatment has been planned according to the most current medical practices available, unavoidable complications occasionally occur. If you have any problems or questions after discharge, call your caregiver. FINDING OUT THE RESULTS OF YOUR TEST Not all test results are available during your visit. If your test results are not back during the visit, make an appointment with your  your caregiver to find out the results. Do not assume everything is normal if you have not heard from your caregiver or the medical facility. It is important for you to follow up on all of your test results.  °HOME CARE INSTRUCTIONS  °You have had sedation and may be sleepy or  dizzy. Your thinking may not be as clear as usual. For the next 24 hours: °· Only take over-the-counter or prescription medicines for pain, discomfort, and or fever as directed by your caregiver. °· Do not drink alcohol. °· Do not smoke. °· Do not drive. °· Do not make important legal decisions. °· Do not operate heavy machinery. °· Do not care for small children by yourself. °· Keep your dressing clean and dry. You may replace dressing with a bandage after 24 hours. °· You may take a bath or shower after 24 hours. °· Use an ice pack for 20 minutes every 2 hours while awake for pain as needed. °SEEK MEDICAL CARE IF:  °· There is redness, swelling, or increasing pain at the biopsy site. °· There is pus coming from the biopsy site. °· There is drainage from a biopsy site lasting longer than one day. °· An unexplained oral temperature above 102° F (38.9° C) develops. °SEEK IMMEDIATE MEDICAL CARE IF:  °· You develop a rash. °· You have difficulty breathing. °· You develop any reaction or side effects to medications given. °Document Released: 09/15/2004 Document Revised: 05/21/2011 Document Reviewed: 02/24/2008 °[employer]® Patient Information ©2015 [employer]. This information is not intended to replace advice given to you by your health care provider. Make sure you discuss any questions you have with your health care provider. ° °

## 2014-09-29 ENCOUNTER — Telehealth: Payer: Self-pay | Admitting: Internal Medicine

## 2014-09-29 ENCOUNTER — Other Ambulatory Visit: Payer: Self-pay | Admitting: Internal Medicine

## 2014-09-29 ENCOUNTER — Telehealth: Payer: Self-pay

## 2014-09-29 ENCOUNTER — Telehealth: Payer: Self-pay | Admitting: Hematology

## 2014-09-29 DIAGNOSIS — R791 Abnormal coagulation profile: Secondary | ICD-10-CM

## 2014-09-29 DIAGNOSIS — E8581 Light chain (AL) amyloidosis: Secondary | ICD-10-CM

## 2014-09-29 NOTE — Telephone Encounter (Signed)
Pt requesting refill on metoprolol (LOPRESSOR) 100 MG tablet, pt states he is completely out of medications.

## 2014-09-29 NOTE — Telephone Encounter (Signed)
I I called [name] this morning to discuss his lab results and see how he was doing after his bone marrow biopsy yesterday. He notes that he is feeling better after the blood transfusions. I also discussed his available lab results and the abnormal PTT results and what that might mean. We discussed his abnormal SPEP and serum free light chain ratios is as well as other blood tests.  He has whole-body imaging scan scheduled for 10/05/2014. He hasn't heard back on the echocardiogram and he was asked to follow-up with scheduling in a couple of days if the echo was not scheduled. We also  He shall be following up with me in clinic on 10/04/2014 for further cares.  [name]  09/29/2014

## 2014-09-29 NOTE — Telephone Encounter (Signed)
Patient is aware his prescription was refilled

## 2014-10-04 ENCOUNTER — Ambulatory Visit: Payer: Self-pay | Attending: Family Medicine | Admitting: Family Medicine

## 2014-10-04 ENCOUNTER — Inpatient Hospital Stay (HOSPITAL_COMMUNITY): Admission: RE | Admit: 2014-10-04 | Payer: Self-pay | Source: Ambulatory Visit

## 2014-10-04 ENCOUNTER — Other Ambulatory Visit (HOSPITAL_COMMUNITY): Payer: Self-pay | Admitting: Respiratory Therapy

## 2014-10-04 ENCOUNTER — Ambulatory Visit (HOSPITAL_COMMUNITY)
Admission: RE | Admit: 2014-10-04 | Discharge: 2014-10-04 | Disposition: A | Discharge status: Home / Self Care | Payer: Self-pay | Source: Ambulatory Visit | Attending: Hematology | Admitting: Hematology

## 2014-10-04 ENCOUNTER — Encounter: Payer: Self-pay | Admitting: Family Medicine

## 2014-10-04 VITALS — BP 163/67 | HR 63 | Temp 98.7°F | Resp 16 | Ht 68.5 in | Wt 179.0 lb

## 2014-10-04 DIAGNOSIS — E859 Amyloidosis, unspecified: Secondary | ICD-10-CM | POA: Insufficient documentation

## 2014-10-04 DIAGNOSIS — I10 Essential (primary) hypertension: Secondary | ICD-10-CM | POA: Insufficient documentation

## 2014-10-04 DIAGNOSIS — I129 Hypertensive chronic kidney disease with stage 1 through stage 4 chronic kidney disease, or unspecified chronic kidney disease: Secondary | ICD-10-CM | POA: Insufficient documentation

## 2014-10-04 DIAGNOSIS — Z114 Encounter for screening for human immunodeficiency virus [HIV]: Secondary | ICD-10-CM | POA: Insufficient documentation

## 2014-10-04 DIAGNOSIS — N189 Chronic kidney disease, unspecified: Secondary | ICD-10-CM | POA: Insufficient documentation

## 2014-10-04 DIAGNOSIS — N183 Chronic kidney disease, stage 3 unspecified: Secondary | ICD-10-CM

## 2014-10-04 MED ORDER — ALBUTEROL SULFATE (2.5 MG/3ML) 0.083% IN NEBU
2.5000 mg | INHALATION_SOLUTION | Freq: Once | RESPIRATORY_TRACT | Status: AC
  Administered 2014-10-04: 2.5 mg via RESPIRATORY_TRACT

## 2014-10-04 NOTE — Patient Instructions (Signed)
[name],  Thank you for coming in today  1. Kidney disease: Checking BMP today to trend creatinine  Follow up with Dr. [name] next month   2. Healthcare maintenance: Screening HIV today  3. HTN: Continue current regimen I will contact Dr. [name] about either increase hydralazine to 100 mg three times a day or adding imdur to BP regimen   F/u in 6 weeks with RN for BP check and flu shot  F/u with me in 3 months   Dr. Adrian Blackwater

## 2014-10-04 NOTE — Assessment & Plan Note (Signed)
Screening HIV ordered  

## 2014-10-04 NOTE — Assessment & Plan Note (Signed)
Kidney disease: Checking BMP today to trend creatinine  Follow up with Dr. [name] next month

## 2014-10-04 NOTE — Progress Notes (Signed)
   Subjective:    Patient ID: Jose Garrett, male    DOB: [birth year], 64 y.o.   MRN: [id number] CC; f/u HTN  HPI 64 yo M with chronic kidney disease, AL amyloidosis and resistant HTN presents for f/u   1. CHRONIC HYPERTENSION  Disease Monitoring  Blood pressure range: not checking   Chest pain: no   Dyspnea: no   Claudication: no   Medication compliance: yes  Medication Side Effects  Lightheadedness: no   Urinary frequency: yes, on lasix    Edema: no   2. CKD: followed by renal. Cr is down-trending from recent elevation following R renal biopsy complicated by hematoma formation. Patient denies hematuria. Last Cr 3.5 > 3.3> 3.2    Soc Hx: non smoker   Review of Systems  Constitutional: Negative for fever, chills, fatigue and unexpected weight change.  Eyes: Negative for visual disturbance.  Respiratory: Negative for cough and shortness of breath.   Cardiovascular: Negative for chest pain, palpitations and leg swelling.  Gastrointestinal: Negative for nausea, vomiting, abdominal pain, diarrhea, constipation and blood in stool.  Genitourinary: Negative for hematuria.  Musculoskeletal: Negative for myalgias.  Skin: Negative for rash.  Neurological: Negative for dizziness, weakness and light-headedness.       Objective:   Physical Exam BP 163/67 mmHg  Pulse 63  Temp(Src) 98.7 F (37.1 C) (Oral)  Resp 16  Ht 5' 8.5" (1.74 m)  Wt 179 lb (81.194 kg)  BMI 26.82 kg/m2  SpO2 98%  BP Readings from Last 3 Encounters:  10/04/14 163/67  09/28/14 173/55  09/24/14 171/61   Wt Readings from Last 3 Encounters:  10/04/14 179 lb (81.194 kg)  09/28/14 192 lb (87.091 kg)  09/24/14 192 lb (87.091 kg)  General appearance: alert, cooperative and no distress Lungs: clear to auscultation bilaterally Heart: regular rate and rhythm, S1, S2 normal, no murmur, click, rub or gallop Extremities: extremities normal, atraumatic, no cyanosis or edema       Assessment & Plan:

## 2014-10-04 NOTE — Assessment & Plan Note (Signed)
A: HTN: BP elevated Med: compliant P: Continue current regimen I will contact Dr. [name] about either increase hydralazine to 100 mg three times a day or adding imdur to BP regimen

## 2014-10-05 ENCOUNTER — Other Ambulatory Visit (HOSPITAL_COMMUNITY): Payer: Self-pay | Admitting: Respiratory Therapy

## 2014-10-05 ENCOUNTER — Ambulatory Visit (HOSPITAL_COMMUNITY): Payer: Self-pay

## 2014-10-05 ENCOUNTER — Telehealth: Payer: Self-pay | Admitting: Family Medicine

## 2014-10-05 ENCOUNTER — Encounter (HOSPITAL_COMMUNITY): Payer: No Typology Code available for payment source

## 2014-10-05 LAB — BASIC METABOLIC PANEL
BUN: 38 mg/dL — AB (ref 7–25)
CHLORIDE: 111 mmol/L — AB (ref 98–110)
CO2: 21 mmol/L (ref 20–31)
Calcium: 9.6 mg/dL (ref 8.6–10.3)
Creat: 3.27 mg/dL — ABNORMAL HIGH (ref 0.70–1.25)
Glucose, Bld: 111 mg/dL — ABNORMAL HIGH (ref 65–99)
Potassium: 5 mmol/L (ref 3.5–5.3)
Sodium: 145 mmol/L (ref 135–146)

## 2014-10-05 LAB — HIV ANTIBODY (ROUTINE TESTING W REFLEX): HIV 1&2 Ab, 4th Generation: NONREACTIVE

## 2014-10-05 NOTE — Telephone Encounter (Signed)
Patient called. Left VM Asked patient to call back for information  When patient calls please do the following:   Screening HIV negative Cr is stable at 3.3 Potassium is elevated at 5, please increase lasix to 40 mg three times a day for next 3 days to reduce potassium

## 2014-10-05 NOTE — Telephone Encounter (Signed)
Patient returned phone call, please f/u with pt.    °

## 2014-10-05 NOTE — Telephone Encounter (Signed)
Pt aware.

## 2014-10-06 ENCOUNTER — Other Ambulatory Visit: Payer: Self-pay | Admitting: Hematology

## 2014-10-06 ENCOUNTER — Telehealth: Payer: Self-pay | Admitting: Hematology

## 2014-10-06 DIAGNOSIS — E8581 Light chain (AL) amyloidosis: Secondary | ICD-10-CM

## 2014-10-06 LAB — PULMONARY FUNCTION TEST
DL/VA % PRED: 82 %
DL/VA: 3.72 ml/min/mmHg/L
DLCO cor % pred: 74 %
DLCO cor: 22.52 ml/min/mmHg
DLCO unc % pred: 61 %
DLCO unc: 18.54 ml/min/mmHg
FEF 25-75 PRE: 1.72 L/s
FEF 25-75 Post: 1.77 L/sec
FEF2575-%Change-Post: 2 %
FEF2575-%PRED-PRE: 63 %
FEF2575-%Pred-Post: 65 %
FEV1-%CHANGE-POST: -1 %
FEV1-%Pred-Post: 81 %
FEV1-%Pred-Pre: 82 %
FEV1-PRE: 2.76 L
FEV1-Post: 2.71 L
FEV1FVC-%Change-Post: 0 %
FEV1FVC-%Pred-Pre: 93 %
FEV6-%Change-Post: -2 %
FEV6-%PRED-POST: 90 %
FEV6-%Pred-Pre: 92 %
FEV6-POST: 3.81 L
FEV6-Pre: 3.92 L
FEV6FVC-%CHANGE-POST: 0 %
FEV6FVC-%PRED-PRE: 104 %
FEV6FVC-%Pred-Post: 104 %
FVC-%Change-Post: -2 %
FVC-%Pred-Post: 86 %
FVC-%Pred-Pre: 88 %
FVC-POST: 3.85 L
FVC-Pre: 3.94 L
POST FEV6/FVC RATIO: 99 %
PRE FEV6/FVC RATIO: 100 %
Post FEV1/FVC ratio: 70 %
Pre FEV1/FVC ratio: 70 %
RV % pred: 79 %
RV: 1.76 L
TLC % pred: 89 %
TLC: 6.02 L

## 2014-10-06 LAB — CHROMOSOME ANALYSIS, BONE MARROW

## 2014-10-06 LAB — TISSUE HYBRIDIZATION (BONE MARROW)-NCBH

## 2014-10-06 NOTE — Telephone Encounter (Signed)
Confirmed appointment for 07/28 & 08/01. Patient aware working on Neurology appointment

## 2014-10-07 ENCOUNTER — Encounter (HOSPITAL_COMMUNITY)
Admission: RE | Admit: 2014-10-07 | Discharge: 2014-10-07 | Disposition: A | Discharge status: Home / Self Care | Payer: No Typology Code available for payment source | Source: Ambulatory Visit | Attending: Hematology | Admitting: Hematology

## 2014-10-07 DIAGNOSIS — E8581 Light chain (AL) amyloidosis: Secondary | ICD-10-CM

## 2014-10-07 DIAGNOSIS — E858 Other amyloidosis: Secondary | ICD-10-CM | POA: Insufficient documentation

## 2014-10-07 DIAGNOSIS — Z5111 Encounter for antineoplastic chemotherapy: Secondary | ICD-10-CM

## 2014-10-07 MED ORDER — LORAZEPAM 1 MG PO TABS: 1.0000 mg | ORAL_TABLET | Freq: Two times a day (BID) | ORAL | Status: DC | PRN

## 2014-10-07 NOTE — Telephone Encounter (Signed)
Please call patient to patient Valium is a bit to slow acting Recommend and ordered ativan, rx available for pick up

## 2014-10-07 NOTE — Progress Notes (Signed)
  Echocardiogram 2D Echocardiogram has been performed.  Jose Garrett 10/07/2014, 9:59 AM

## 2014-10-07 NOTE — Addendum Note (Signed)
Addended by: [name] on: 10/07/2014 10:44 AM   Modules accepted: Orders

## 2014-10-08 ENCOUNTER — Ambulatory Visit: Payer: Self-pay

## 2014-10-08 ENCOUNTER — Encounter (HOSPITAL_COMMUNITY): Payer: Self-pay

## 2014-10-08 NOTE — Telephone Encounter (Signed)
Rx at front office  Pt aware

## 2014-10-11 ENCOUNTER — Encounter: Payer: Self-pay | Admitting: Hematology

## 2014-10-11 ENCOUNTER — Other Ambulatory Visit: Payer: Self-pay

## 2014-10-11 ENCOUNTER — Ambulatory Visit (HOSPITAL_BASED_OUTPATIENT_CLINIC_OR_DEPARTMENT_OTHER): Payer: Self-pay | Admitting: Hematology

## 2014-10-11 ENCOUNTER — Telehealth: Payer: Self-pay | Admitting: *Deleted

## 2014-10-11 ENCOUNTER — Telehealth: Payer: Self-pay | Admitting: Hematology

## 2014-10-11 ENCOUNTER — Ambulatory Visit (HOSPITAL_BASED_OUTPATIENT_CLINIC_OR_DEPARTMENT_OTHER): Payer: Self-pay

## 2014-10-11 VITALS — BP 149/59 | HR 60 | Temp 98.4°F | Resp 18 | Ht 68.5 in | Wt 178.4 lb

## 2014-10-11 DIAGNOSIS — E858 Other amyloidosis: Secondary | ICD-10-CM

## 2014-10-11 DIAGNOSIS — E8581 Light chain (AL) amyloidosis: Secondary | ICD-10-CM

## 2014-10-11 DIAGNOSIS — D649 Anemia, unspecified: Secondary | ICD-10-CM

## 2014-10-11 DIAGNOSIS — R791 Abnormal coagulation profile: Secondary | ICD-10-CM

## 2014-10-11 LAB — CBC & DIFF AND RETIC
BASO%: 0.4 % (ref 0.0–2.0)
Basophils Absolute: 0 10*3/uL (ref 0.0–0.1)
EOS%: 3.5 % (ref 0.0–7.0)
Eosinophils Absolute: 0.2 10*3/uL (ref 0.0–0.5)
HCT: 29.7 % — ABNORMAL LOW (ref 38.4–49.9)
HEMOGLOBIN: 9.9 g/dL — AB (ref 13.0–17.1)
Immature Retic Fract: 1.4 % — ABNORMAL LOW (ref 3.00–10.60)
LYMPH%: 22.5 % (ref 14.0–49.0)
MCH: 28.9 pg (ref 27.2–33.4)
MCHC: 33.3 g/dL (ref 32.0–36.0)
MCV: 86.8 fL (ref 79.3–98.0)
MONO#: 0.4 10*3/uL (ref 0.1–0.9)
MONO%: 8.3 % (ref 0.0–14.0)
NEUT%: 65.3 % (ref 39.0–75.0)
NEUTROS ABS: 3.4 10*3/uL (ref 1.5–6.5)
Platelets: 166 10*3/uL (ref 140–400)
RBC: 3.42 10*6/uL — AB (ref 4.20–5.82)
RDW: 14.3 % (ref 11.0–14.6)
RETIC %: 1.48 % (ref 0.80–1.80)
Retic Ct Abs: 50.62 10*3/uL (ref 34.80–93.90)
WBC: 5.2 10*3/uL (ref 4.0–10.3)
lymph#: 1.2 10*3/uL (ref 0.9–3.3)
nRBC: 0 % (ref 0–0)

## 2014-10-11 LAB — COMPREHENSIVE METABOLIC PANEL (CC13)
ALK PHOS: 84 U/L (ref 40–150)
ALT: 12 U/L (ref 0–55)
AST: 16 U/L (ref 5–34)
Albumin: 3.3 g/dL — ABNORMAL LOW (ref 3.5–5.0)
Anion Gap: 10 mEq/L (ref 3–11)
BUN: 38.5 mg/dL — ABNORMAL HIGH (ref 7.0–26.0)
CO2: 19 meq/L — AB (ref 22–29)
Calcium: 9.1 mg/dL (ref 8.4–10.4)
Chloride: 111 mEq/L — ABNORMAL HIGH (ref 98–109)
Creatinine: 3.4 mg/dL (ref 0.7–1.3)
EGFR: 18 mL/min/{1.73_m2} — ABNORMAL LOW (ref >90)
Glucose: 150 mg/dl — ABNORMAL HIGH (ref 70–140)
Potassium: 3.5 mEq/L (ref 3.5–5.1)
Sodium: 139 mEq/L (ref 136–145)
Total Bilirubin: 0.43 mg/dL (ref 0.20–1.20)
Total Protein: 6.8 g/dL (ref 6.4–8.3)

## 2014-10-11 LAB — PROTHROMBIN TIME
INR: 1.42 (ref <1.50)
PROTHROMBIN TIME: 17.4 s — AB (ref 11.6–15.2)

## 2014-10-11 LAB — TSH CHCC: TSH: 1.278 m[IU]/L (ref 0.320–4.118)

## 2014-10-11 MED ORDER — ACYCLOVIR 400 MG PO TABS: 400.0000 mg | ORAL_TABLET | Freq: Every day | ORAL | Status: DC

## 2014-10-11 MED ORDER — ONDANSETRON HCL 8 MG PO TABS: 8.0000 mg | ORAL_TABLET | Freq: Two times a day (BID) | ORAL | Status: DC | PRN

## 2014-10-11 MED ORDER — CYCLOPHOSPHAMIDE 10 MG/ML ORAL SOLUTION: 500.0000 mg | ORAL | Status: DC

## 2014-10-11 MED ORDER — LORAZEPAM 1 MG PO TABS: 1.0000 mg | ORAL_TABLET | Freq: Three times a day (TID) | ORAL | Status: DC | PRN

## 2014-10-11 MED ORDER — DEXAMETHASONE 4 MG PO TABS: 40.0000 mg | ORAL_TABLET | ORAL | Status: DC

## 2014-10-11 MED ORDER — PROCHLORPERAZINE MALEATE 10 MG PO TABS: 10.0000 mg | ORAL_TABLET | Freq: Four times a day (QID) | ORAL | Status: DC | PRN

## 2014-10-11 NOTE — Telephone Encounter (Signed)
Pt confirmed labs/ov per 08/01 POF, gave pt avs and calendar.Cherylann Banas, sent msg to add chemo

## 2014-10-11 NOTE — Telephone Encounter (Signed)
Per staff message and POF I have scheduled appts. Advised scheduler of appts. JMW  

## 2014-10-12 ENCOUNTER — Ambulatory Visit (INDEPENDENT_AMBULATORY_CARE_PROVIDER_SITE_OTHER): Payer: Self-pay | Admitting: Neurology

## 2014-10-12 ENCOUNTER — Encounter: Payer: Self-pay | Admitting: Hematology

## 2014-10-12 DIAGNOSIS — E8581 Light chain (AL) amyloidosis: Secondary | ICD-10-CM

## 2014-10-12 DIAGNOSIS — E858 Other amyloidosis: Secondary | ICD-10-CM

## 2014-10-12 DIAGNOSIS — R94131 Abnormal electromyogram [EMG]: Secondary | ICD-10-CM

## 2014-10-12 NOTE — Procedures (Signed)
[facility]  [address]  [place], [place] [address] Tel: [phone] Fax:  [phone] Test Date:  10/12/2014  Patient: Jose Garrett DOB: 10-Aug-1950 Physician: Narda Amber  Sex: Male Height: 5\' 8"  Ref Phys: Dr [name]  ID#: [id number] Temp: 35.2C Technician: Jerilynn Mages. Dean   Patient Complaints: This is a 64 year old gentleman with recently diagnosed amyloidosis presenting for evaluation of intermittent paresthesias of the feet and leg weakness.   NCV & EMG Findings: Extensive electrodiagnostic testing of the right upper and lower extremities with additional studies of the left lower extremities shows:  1. Right median, ulnar, and radial sensory responses are within normal limits. 2. Right median and ulnar motor responses are within normal limits. 3. Right sural and superficial peroneal sensory responses are within normal limits. 4. Bilateral tibial and peroneal motor responses are markedly reduced. In isolation, these findings are most likely due to localized compression from shoe wearing. 5. There are sparse is normal, complex, polyphasic motor units seen in the rectus femoris and vastus lateralis muscles bilaterally. There is no evidence of early recruitment. Similar findings are not seen in other proximal muscle groups. There is no evidence of muscle irritability.   Impression: 1. Needle electrode examination shows very mild myopathic changes isolated to the quadriceps muscles bilaterally. Similar findings were not seen in any of the tested muscles groups. 2. There is no evidence of a generalized sensorimotor polyneuropathy or cervical/lumbosacral radiculopathy affecting the right side.  ___________________________ Narda Amber, DO    Nerve Conduction Studies Anti Sensory Summary Table   Stim Site NR Peak (ms) Norm Peak (ms) P-T Amp (V) Norm P-T Amp  Right Median Anti Sensory (2nd Digit)  Wrist    3.7 <3.8 17.2 >10  Right Radial Anti Sensory (Base 1st Digit)   Wrist    2.2 <2.8 17.6 >10  Right Sup Peroneal Anti Sensory (Ant Lat Mall)  12 cm    3.8 <4.6 6.1 >3  Right Sural Anti Sensory (Lat Mall)  Calf    4.1 <4.6 3.1 >3  Right Ulnar Anti Sensory (5th Digit)  Wrist    3.2 <3.2 16.1 >5   Motor Summary Table   Stim Site NR Onset (ms) Norm Onset (ms) O-P Amp (mV) Norm O-P Amp Site1 Site2 Delta-0 (ms) Dist (cm) Vel (m/s) Norm Vel (m/s)  Right Median Motor (Abd Poll Brev)  Wrist    3.3 <4.0 7.1 >5 Elbow Wrist 5.5 28.0 51 >50  Elbow    8.8  6.5         Left Peroneal Motor (Ext Dig Brev)  Ankle    5.7 <6.0 0.5 >2.5 B Fib Ankle 12.3 37.0 30 >40  B Fib    18.0  0.5  Poplt B Fib 2.7 10.0 37 >40  Poplt    20.7  0.5         Right Peroneal Motor (Ext Dig Brev)  Ankle    5.0 <6.0 0.6 >2.5 B Fib Ankle 7.9 32.0 41 >40  B Fib    12.9  0.5  Poplt B Fib 2.5 10.0 40 >40  Poplt    15.4  0.4         Left Peroneal TA Motor (Tib Ant)  Fib Head    2.7 <4.5 3.9 >3 Poplit Fib Head 1.4 8.0 57 >40  Poplit    4.1  3.7         Right Peroneal TA Motor (Tib Ant)  Fib Head    2.6 <  4.5 3.4 >3 Poplit Fib Head 2.1 10.0 48 >40  Poplit    4.7  3.1         Left Tibial Motor (Abd Hall Brev)  Ankle    4.2 <6.0 0.2 >4 Knee Ankle 11.2 41.0 37 >40  Knee    15.4  0.1         Right Tibial Motor (Abd Hall Brev)  Ankle    4.2 <6.0 0.4 >4 Knee Ankle 13.1 44.0 34 >40  Knee    17.3  0.3         Right Ulnar Motor (Abd Dig Minimi)  Wrist    2.9 <3.1 8.3 >7 B Elbow Wrist 4.2 24.0 57 >50  B Elbow    7.1  7.7  A Elbow B Elbow 1.7 10.0 59 >50  A Elbow    8.8  7.3          H Reflex Studies   NR H-Lat (ms) Lat Norm (ms) L-R H-Lat (ms)  Right Tibial (Gastroc)  NR  <35    EMG   Side Muscle Ins Act Fibs Psw Fasc Number Recrt Dur Dur. Amp Amp. Poly Poly. Comment  Right 1stDorInt Nml Nml Nml Nml Nml Nml Nml Nml Nml Nml Nml Nml N/A  Right Flex Dig Long Nml Nml Nml Nml Nml Nml Nml Nml Nml Nml Nml Nml N/A  Right FlexPolLong Nml Nml Nml Nml Nml Nml Nml Nml Nml Nml Nml Nml N/A  Right Ext  Indicis Nml Nml Nml Nml Nml Nml Nml Nml Nml Nml Nml Nml N/A  Right PronatorTeres Nml Nml Nml Nml Nml Nml Nml Nml Nml Nml Nml Nml N/A  Right Biceps Nml Nml Nml Nml Nml Nml Nml Nml Nml Nml Nml Nml N/A  Right Deltoid Nml Nml Nml Nml Nml Nml Nml Nml Nml Nml Nml Nml N/A  Right Triceps Nml Nml Nml Nml Nml Nml Nml Nml Nml Nml Nml Nml N/A  Right AntTibialis Nml Nml Nml Nml Nml Nml Nml Nml Nml Nml Nml Nml N/A  Right Gastroc Nml Nml Nml Nml Nml Nml Nml Nml Nml Nml Nml Nml N/A  Right Iliacus Nml Nml Nml Nml 1- Mod Some 1+ Some 1- Some 1+ N/A  Right RectFemoris Nml Nml Nml Nml 1- Mod Some 1+ Some 1- Some 1+ N/A  Right GluteusMed Nml Nml Nml Nml Nml Nml Nml Nml Nml Nml Nml Nml N/A  Right VastusLat Nml Nml Nml Nml 1- Mod Some 1+ Some 1- Some 1+ N/A  Right Lumbo Parasp Low Nml Nml Nml Nml Nml Nml Nml Nml Nml Nml Nml Nml N/A  Right AdductorLong Nml Nml Nml Nml 1- Mod-V Nml Nml Nml Nml Nml Nml N/A  Left RectFemoris Nml Nml Nml Nml Nml Nml Nml Nml Nml Nml Nml Nml N/A  Left VastusLat Nml Nml Nml Nml 1- Mod Some 1+ Some 1- Some 2+ N/A  Left AntTibialis Nml Nml Nml Nml Nml Nml Nml Nml Nml Nml Nml Nml N/A      Waveforms:

## 2014-10-12 NOTE — Progress Notes (Signed)
[name]    CONSULT NOTE  Date of service 10/11/2014  Patient Care Team: [name], MD as PCP - General (Family Medicine) [name], MD as PCP - Hematology/Oncology (Hematology and Oncology) [name], MD as Consulting Physician (Nephrology)  CHIEF COMPLAINTS: followup for AL amyloidosis HISTORY OF PRESENTING ILLNESS:  Jose Garrett is a very pleasant 64 yo caucasian male with below mentioned past medical who has been referred for evaluation and management of his recently noted AL (Lambda light chain) renal amyloidosis with progressively worsening renal function and anemia.  Patient notes that he understands that he is here for the visit due to the findings of an abnormal protein in his kidney. He notes that is kidney function was gradually worsening from creatinine 2.17 in 11/2013 to recent 3.49 and he also developed progressive anemia with hgb going from normal 13.7 in 11/2013 down to 9.5 on 08/18/2014.  He also reports increasing pedal edema edema over this period which has been somewhat controlled by lasix. He has a h/o diastolic CHF from ECHO in 11/2013. Does not have an established cardiologist per patient.  His clinical findings led to a kidney biopsy on 09/08/2014 for further workup and unfortunately resulted in a large rt sided perinephric hematoma leading to hospitalization and PRBC transfusion.  He notes that he has been significantly fatigued since then.  He has a f/u renal ultrasound on 09/17/2014 that showed no significant change in his per-nephric hematoma.  He notes that his fatigue is significantly limiting his activities and notes increased DOE as well. Still has some pedal edema. Informed consent obtained for PRBC transfusion today in the infusion center.  No chest pain. No focal bone pains. No FND. No abdominal or back pain at this time. No overt hematuria. No other evidence of overt bleeding.  Has noted some increased bruisability in the last few months  without any specific new medications.   INTERVAL HISTORY  Jose Garrett is here for followup of his workup results and for treatment planning.[name]  He notes no acute new symptoms.  He is accompanied by his close friend was appointment.  We talked about his bone marrow biopsy results, echocardiogram and his diagnosis of AL amyloidosis.  We talked about the staging, prognosis and treatment options for his disease.  I offered him the option of being referred for consideration of high-dose chemotherapy with hematopoietic stem cell auto logus transplantation which he wants to hold off at this time. We alternatively discussed treatment with CyBorD regimen which he is in favor of.  We went over the details of each medication and he signed an informed chemotherapy consent.  He has a PET CT scan scheduled for 10/14/2014 and nerve conduction studies are pending. No other acute new concerns.  MEDICAL HISTORY:  Past Medical History  Diagnosis Date  . Hypertension Dx 2012  . Hyperlipidemia Dx 2012  . History of blood transfusion 09/08/2014    "got hematoma after renal biopsy & HgB dropped"  . Anemia Dx 2016  . Family history of adverse reaction to anesthesia     "my daughter can't take certain anesthesia agents" (09/08/2014)  . Enlarged heart 2015  . Chronic diastolic CHF (congestive heart failure), NYHA class 1     /hospital problem list 09/08/2014  . Renal disorder     "there is a spot on my kidney" (09/08/2014)  . Chronic renal disease, stage III     /hospital problem list 09/08/2014    SURGICAL HISTORY: Past Surgical History  Procedure Laterality Date  . Renal biopsy, percutaneous Right 09/08/2014  . Tonsillectomy  ~ 1960  . Laparoscopic cholecystectomy  03/2011    SOCIAL HISTORY: History   Social History  . Marital Status: Divorced    Spouse Name: N/A  . Number of Children: N/A  . Years of Education: N/A   Occupational History  . Not on file.   Social History Main Topics  . Smoking status:  Former Smoker -- 2.00 packs/day for 40 years    Types: Cigarettes    Quit date: 03/19/2011  . Smokeless tobacco: Never Used  . Alcohol Use: No  . Drug Use: No     Comment: 09/08/2014 "I have used marijuana till the 1990's". Notes that he quit 15 yrs ago  . Sexual Activity: Not Currently   Other Topics Concern  . 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 Lives with his daughter and her finance.  FAMILY HISTORY: Family History  Problem Relation Age of Onset  . Hypertension Mother   . Diabetes Mother   . Heart disease Mother   . Skin cancer Mother   . Hypertension Father   . Diabetes Father   . Diabetes Sister     ALLERGIES:  has No Known Allergies.  MEDICATIONS:  Current Outpatient Prescriptions  Medication Sig Dispense Refill  . acyclovir (ZOVIRAX) 400 MG tablet Take 1 tablet (400 mg total) by mouth daily. (Dose adjusted for renal function) 60 tablet 3  . amLODipine (NORVASC) 10 MG tablet Take 1 tablet (10 mg total) by mouth daily. (Patient taking differently: Take 10 mg by mouth daily with lunch. ) 30 tablet 3  . cyclophosphamide (CYTOXAN) 10 mg/ml solution Take 50 mLs (500 mg total) by mouth once a week. Take 1hr before or 2hr after meals on days 1,8,15,22. 200 mL 1  . dexamethasone (DECADRON) 4 MG tablet Take 10 tablets (40 mg total) by mouth once a week. On Day 1,8,15 and 22. 30-38mins prior to cyclophosphamide 40 tablet 1  . furosemide (LASIX) 40 MG tablet Take 40 mg by mouth 2 (two) times daily.    [name] gemfibrozil (LOPID) 600 MG tablet Take 1 tablet (600 mg total) by mouth 2 (two) times daily before a meal. 90 tablet 2  . hydrALAZINE (APRESOLINE) 50 MG tablet Take 1 tablet (50 mg total) by mouth 3 (three) times daily. 120 tablet 2  . LORazepam (ATIVAN) 1 MG tablet Take 1 tablet (1 mg total) by mouth every 8 (eight) hours as needed for anxiety (or nausea). 30 tablet 0  . metoprolol (LOPRESSOR) 100 MG  tablet TAKE 1 TABLET BY MOUTH 2 TIMES A DAY 60 tablet 3  . ondansetron (ZOFRAN) 8 MG tablet Take 1 tablet (8 mg total) by mouth 2 (two) times daily as needed (Nausea or vomiting). 30 tablet 1  . prochlorperazine (COMPAZINE) 10 MG tablet Take 1 tablet (10 mg total) by mouth every 6 (six) hours as needed (Nausea or vomiting). 30 tablet 1  . vitamin E 100 UNIT capsule Take 100 Units by mouth every other day.      No current facility-administered medications for this visit.    REVIEW OF SYSTEMS:   Constitutional: Denies fevers, chills or abnormal night sweats Eyes: Denies blurriness of vision, double vision or watery eyes Ears, nose, mouth, throat, and face: Denies mucositis or sore throat Respiratory: Denies cough, dyspnea or wheezes Cardiovascular: Denies palpitation, chest discomfort or lower extremity swelling  Gastrointestinal:  Denies nausea, heartburn or change in bowel habits Skin: Denies abnormal skin rashes Lymphatics: Denies new lymphadenopathy or easy bruising Neurological:Denies numbness, tingling or new weaknesses Behavioral/Psych: Mood is stable, no new changes  All other systems were reviewed with the patient and are negative.   PHYSICAL EXAMINATION: ECOG PERFORMANCE STATUS: 2 - Symptomatic, <50% confined to bed  Filed Vitals:   10/11/14 0851  BP: 149/59  Pulse: 60  Temp: 98.4 F (36.9 C)  Resp: 18   Filed Weights   10/11/14 0851  Weight: 178 lb 6.4 oz (80.922 kg)    GENERAL:alert, no distress , appears fatigued. SKIN: significant conjunctival pallor noted.  EYES: nl EOM, PERL OROPHARYNX:no exudate, no erythema and lips, buccal mucosa, and tongue normal  NECK: no JVD LYMPH:  no palpable lymphadenopathy in the cervical, axillary or inguinal LUNGS: clear to auscultation and percussion with normal breathing effort HEART: regular rate & rhythm and 2/6 SM aortic area ABDOMEN:abdomen soft, non-tender and normal bowel sounds Musculoskeletal:b/l 2+ pedal  edema PSYCH: alert & oriented x 3 with fluent speech NEURO: no focal motor/sensory deficits  LABORATORY DATA:  I have reviewed the data as listed. CBC Latest Ref Rng 10/11/2014 09/28/2014 09/24/2014  WBC 4.0 - 10.3 10e3/uL 5.2 7.0 6.2  Hemoglobin 13.0 - 17.1 g/dL 9.9(L) 9.6(L) 7.1(L)  Hematocrit 38.4 - 49.9 % 29.7(L) 29.3(L) 21.8(L)  Platelets 140 - 400 10e3/uL 166 357 329       Recent Labs  02/25/14 0959 03/02/14 1227 06/30/14 1528 08/18/14 1537 09/15/14 1539 09/24/14 1224 10/04/14 1223 10/11/14 0824  NA 144 141 141 142 143 142 145 139  K 4.9 5.0 4.2 3.8 4.3 3.9 5.0 3.5  CL 115* 113* 109 110 108  --  111*  --   CO2 18* 18* 18* 19 16* 18* 21 19*  GLUCOSE 95 92 94 120* 113* 111 111* 150*  BUN 27* 26* 30* 44* 36* 33.0* 38* 38.5*  CREATININE 2.56* 2.38* 2.71* 2.97* 3.49* 3.2* 3.27* 3.4*  CALCIUM 9.0 9.3 9.3 9.0 8.6 9.4 9.6 9.1  GFRNONAA 26* 28* 24*  --   --   --   --   --   GFRAA 30* 33* 28*  --   --   --   --   --   PROT 6.5 6.5 6.6  --   --  6.6  --  6.8  ALBUMIN 3.7 3.8 4.0 4.1 3.7 2.9*  --  3.3*  AST $Re'17 17 18  'Aza$ --   --  19  --  16  ALT $Re'10 11 11  'GYv$ --   --  16  --  12  ALKPHOS 62 66 71  --   --  152*  --  84  BILITOT 0.3 0.3 0.3  --   --  0.38  --  0.43   . Lab Results  Component Value Date   TOTALPROTELP 5.9* 09/24/2014   ALBUMINELP 3.1* 09/24/2014   A1GS 0.6* 09/24/2014   A2GS 0.8 09/24/2014   BETS 0.3* 09/24/2014   BETA2SER 0.4 09/24/2014   GAMS 0.7* 09/24/2014   SPEI * 09/24/2014   (this displays SPEP labs)  Lab Results  Component Value Date   KPAFRELGTCHN 6.38* 09/24/2014   LAMBDASER 5.09* 09/24/2014   KAPLAMBRATIO 1.25 09/24/2014   (kappa/lambda light chains)  BONE MARROW BIOPSY RESULTS 09/28/2014:   RADIOGRAPHIC STUDIES: I have personally reviewed the radiological images as listed and agreed with the findings in the report. US Renal  09/17/2014  CLINICAL DATA:  Renal hematoma.  EXAM: RENAL / URINARY TRACT ULTRASOUND COMPLETE  COMPARISON:  CT  09/08/2014.  FINDINGS: Right Kidney:  Length: 12.9 cm. Echogenicity within normal limits. No hydronephrosis visualized. Large perinephric complex fluid collection is again noted, similar appearance to prior CT of 09/08/2014. This is consistent with large perinephric hematoma previously identified. Blood flow noted to the right kidney .  Left Kidney:  Length: 11.6 cm. Echogenicity within normal limits. No mass or hydronephrosis visualized.  Bladder:  Appears normal for degree of bladder distention.  IMPRESSION: Persistent large right perinephric hematoma. No significant change from prior CT of 09/08/2014.   Electronically Signed   By: Marcello Moores  Register   On: 09/17/2014 09:06   Ct Biopsy  09/28/2014   CLINICAL DATA:  64 year old male with renal amyloidosis, referred for bone marrow biopsy  EXAM: CT-GUIDED BIOPSY  MEDICATIONS AND MEDICAL HISTORY: Versed 2.0 mg, Fentanyl 75 mcg.  Additional Medications: .  ANESTHESIA/SEDATION: Moderate sedation time: 8 minutes  PROCEDURE: The procedure risks, benefits, and alternatives were explained to the patient. Questions regarding the procedure were encouraged and answered. The patient understands and consents to the procedure.  Scout CT of the pelvis was performed for surgical planning purposes.  The posterior pelvis was prepped with Betadinein a sterile fashion, and a sterile drape was applied covering the operative field. A sterile gown and sterile gloves were used for the procedure. Local anesthesia was provided with 1% Lidocaine.  We targeted the right posterior iliac bone for biopsy. The skin and subcutaneous tissues were infiltrated with 1% lidocaine without epinephrine. A small stab incision was made with an 11 blade scalpel, and an 11 gauge Murphy needle was advanced with CT guidance to the posterior cortex. Manual forced was used to advance the needle through the posterior cortex and the stylet was removed. A bone marrow aspirate was retrieved and passed to a  cytotechnologist in the room. The Murphy needle was then advanced without the stylet for a core biopsy. The core biopsy was retrieved and also passed to a cytotechnologist.  Manual pressure was used for hemostasis and a sterile dressing was placed.  No complications were encountered no significant blood loss was encountered.  Patient tolerated the procedure well and remained hemodynamically stable throughout.  FINDINGS: Scout image demonstrates safe approach to posterior iliac bone.  Images during the case demonstrate placement of 11 gauge Murphy needle  COMPLICATIONS: None  IMPRESSION: Status post CT-guided bone marrow biopsy, with tissue specimen sent to pathology for complete histopathologic analysis  Signed,  Dulcy Fanny. Earleen Newport, DO  [facility]  [facility]   Electronically Signed   By: Corrie Mckusick D.O.   On: 09/28/2014 15:29   Echocardiogram 10/07/2014  Study Conclusions  - Left ventricle: The cavity size was normal. Wall thickness was increased in a pattern of moderate LVH. There was mild focal basal hypertrophy of the septum. Systolic function was normal. The estimated ejection fraction was in the range of 55% to 60%. Wall motion was normal; there were no regional wall motion abnormalities. Features are consistent with a pseudonormal left ventricular filling pattern, with concomitant abnormal relaxation and increased filling pressure (grade 2 diastolic dysfunction). - Aortic valve: There was very mild stenosis. There was mild regurgitation. Valve area (VTI): 2.67 cm^2. Valve area (Vmax): 2.49 cm^2. Valve area (Vmean): 2.3 cm^2. - Mitral valve: There was mild regurgitation.  Impressions:  - Normal LV function; grade 2 diastolic dysfunction; mild to moderate LVH with proximal septal  thickening; calcified aortic valve with very mild AS (mean gradient 10 mmHg) and mild AI; mild Jose; trace TR. Global longitudinal strain -  17%.    ASSESSMENT & PLAN:    Jose Garrett is very pleasant 64 yo caucasian male with   1) AL Amyloidosis noted on recent renal biopsy with nephrotic range proteinuria on urinalysis and some lambda free light chains on serum and urinary IFE . SPEP with small amount of M-spike 0.3 (IFE not done)  Echo showed no systolic dysfunction but grade 2 diastolic dysfunction. [facility] cardiac staging 1 as per troponin and BNP criteria. Bone marrow with no overt evidence of multiple myeloma. Plan -patient offered the option of referral for consideration of autologous hematopoietic stem cell transplant but has currently declined this option. -Awaiting PET/CT results to rule out abdominal bony lesions. -Counseled in detail and all contained a signed consent to start on CyBorD re regimen with cyclophosphamide capped at 500 milligrams weekly. -we'll monitor monthly UPEP's and serological markers for response. - monitor renal function closely -Continue followup with nephrology -patient's case was discussed in hematology tumor board  2) Symptomatic anemia - likely multifactorial -from AL amyloidosis, anemia from CKD, acute blood loss anemia due to large perinephric hematoma related to recent renal bx on 09/08/2014. Hematoma noted to be stable on rpt US renal on 09/17/2014. Hemoglobin stable from last visit posttransfusion. Plan -We'll treat AL amyloidosis. -consideration of ESA as indicated. - transfuse when necessary for hemoglobin less than 8.  RTC in 7 days after starting CyBorD for toxicity check. Continue close f/u with PCP and nephrology.   I appreciate the privilege of being a part of the medical care of this fantastic gentleman.  All questions were answered. The patient knows to call the clinic with any problems, questions or concerns. I spent 60 minutes counseling the patient face to face. The total time spent in the appointment was 80 minutes and more than 50% was on counseling.      Jose Lone, MD 10/11/2014 Hematology/Oncology [facility].   Total time spent 45 min. More than 50% a time spent on direct patient contact, counseling regarding chemotherapy options and transplant and coordination of care.

## 2014-10-13 MED ORDER — CYCLOPHOSPHAMIDE 50 MG PO TABS: 500.0000 mg | ORAL_TABLET | ORAL | Status: DC

## 2014-10-13 MED ORDER — CYCLOPHOSPHAMIDE 50 MG PO TABS: 500.0000 mg | ORAL_TABLET | Freq: Every day | ORAL | Status: DC

## 2014-10-13 NOTE — Addendum Note (Signed)
Addended by: Sullivan Lone on: 10/13/2014 04:43 PM   Modules accepted: Orders, Medications

## 2014-10-14 ENCOUNTER — Telehealth: Payer: Self-pay | Admitting: *Deleted

## 2014-10-14 ENCOUNTER — Encounter (HOSPITAL_COMMUNITY): Payer: Self-pay

## 2014-10-14 ENCOUNTER — Ambulatory Visit (HOSPITAL_COMMUNITY)
Admission: RE | Admit: 2014-10-14 | Discharge: 2014-10-14 | Disposition: A | Discharge status: Home / Self Care | Payer: Self-pay | Source: Ambulatory Visit | Attending: Hematology | Admitting: Hematology

## 2014-10-14 DIAGNOSIS — E8581 Light chain (AL) amyloidosis: Secondary | ICD-10-CM

## 2014-10-14 DIAGNOSIS — S37011D Minor contusion of right kidney, subsequent encounter: Secondary | ICD-10-CM | POA: Insufficient documentation

## 2014-10-14 DIAGNOSIS — E858 Other amyloidosis: Secondary | ICD-10-CM | POA: Insufficient documentation

## 2014-10-14 DIAGNOSIS — X58XXXD Exposure to other specified factors, subsequent encounter: Secondary | ICD-10-CM | POA: Insufficient documentation

## 2014-10-14 DIAGNOSIS — K769 Liver disease, unspecified: Secondary | ICD-10-CM | POA: Insufficient documentation

## 2014-10-14 LAB — PTT FACTOR INHIBITOR (MIXING STUDY): PTT: 48 s — AB (ref 24–37)

## 2014-10-14 LAB — GLUCOSE, CAPILLARY: Glucose-Capillary: 114 mg/dL — ABNORMAL HIGH (ref 65–99)

## 2014-10-14 LAB — FACTOR 10 ASSAY: FACTOR X ACTIVITY: 87 % (ref 70–150)

## 2014-10-14 LAB — MIXING STUDY DILUTIONS, PTT: PATIENT 1/1 IMMEDIATE MIX: 36 s

## 2014-10-14 LAB — CORTISOL: Cortisol, Plasma: 18.4 ug/dL

## 2014-10-14 MED ORDER — FLUDEOXYGLUCOSE F - 18 (FDG) INJECTION
9.5000 | Freq: Once | INTRAVENOUS | Status: AC | PRN
  Administered 2014-10-14: 9.5 via INTRAVENOUS

## 2014-10-14 NOTE — Telephone Encounter (Signed)
Pt left a message stating he cannot afford the $471 copay for his Cytoxan.  Will forward to [place] to see if there is any assistance available for this patient.

## 2014-10-14 NOTE — Telephone Encounter (Signed)
Informed by [employer] that patient does qualify for financial assistance. Patient will need to sign financial assistance form tomorrow before treatment. If patient is able to get cyclophosphamide prescription tomorrow, then he will not have to receive IV cytoxan with velcade treatment per MD [name]. This RN will check tomorrow when patient is here to see if he signs financial assistance form and can get cytoxan prescription.

## 2014-10-15 ENCOUNTER — Ambulatory Visit: Payer: Self-pay | Admitting: *Deleted

## 2014-10-15 ENCOUNTER — Ambulatory Visit: Payer: Self-pay

## 2014-10-15 ENCOUNTER — Ambulatory Visit (HOSPITAL_BASED_OUTPATIENT_CLINIC_OR_DEPARTMENT_OTHER): Payer: Self-pay

## 2014-10-15 ENCOUNTER — Encounter: Payer: Self-pay | Admitting: Hematology

## 2014-10-15 ENCOUNTER — Other Ambulatory Visit: Payer: Self-pay | Admitting: *Deleted

## 2014-10-15 ENCOUNTER — Other Ambulatory Visit (HOSPITAL_BASED_OUTPATIENT_CLINIC_OR_DEPARTMENT_OTHER): Payer: Self-pay

## 2014-10-15 VITALS — BP 133/52 | HR 58 | Temp 98.1°F | Resp 18

## 2014-10-15 DIAGNOSIS — E8581 Light chain (AL) amyloidosis: Secondary | ICD-10-CM

## 2014-10-15 DIAGNOSIS — E858 Other amyloidosis: Secondary | ICD-10-CM

## 2014-10-15 DIAGNOSIS — Z5112 Encounter for antineoplastic immunotherapy: Secondary | ICD-10-CM

## 2014-10-15 DIAGNOSIS — Z5111 Encounter for antineoplastic chemotherapy: Secondary | ICD-10-CM

## 2014-10-15 LAB — CBC & DIFF AND RETIC
BASO%: 0.4 % (ref 0.0–2.0)
Basophils Absolute: 0 10*3/uL (ref 0.0–0.1)
EOS%: 3.3 % (ref 0.0–7.0)
Eosinophils Absolute: 0.2 10*3/uL (ref 0.0–0.5)
HCT: 28.4 % — ABNORMAL LOW (ref 38.4–49.9)
HGB: 9.6 g/dL — ABNORMAL LOW (ref 13.0–17.1)
Immature Retic Fract: 1.6 % — ABNORMAL LOW (ref 3.00–10.60)
LYMPH%: 25.9 % (ref 14.0–49.0)
MCH: 29.2 pg (ref 27.2–33.4)
MCHC: 33.8 g/dL (ref 32.0–36.0)
MCV: 86.3 fL (ref 79.3–98.0)
MONO#: 0.5 10*3/uL (ref 0.1–0.9)
MONO%: 10.4 % (ref 0.0–14.0)
NEUT%: 60 % (ref 39.0–75.0)
NEUTROS ABS: 2.9 10*3/uL (ref 1.5–6.5)
Platelets: 181 10*3/uL (ref 140–400)
RBC: 3.29 10*6/uL — ABNORMAL LOW (ref 4.20–5.82)
RDW: 14.2 % (ref 11.0–14.6)
RETIC %: 1.26 % (ref 0.80–1.80)
Retic Ct Abs: 41.45 10*3/uL (ref 34.80–93.90)
WBC: 4.8 10*3/uL (ref 4.0–10.3)
lymph#: 1.3 10*3/uL (ref 0.9–3.3)

## 2014-10-15 LAB — COMPREHENSIVE METABOLIC PANEL (CC13)
ALT: 12 U/L (ref 0–55)
AST: 15 U/L (ref 5–34)
Albumin: 3.3 g/dL — ABNORMAL LOW (ref 3.5–5.0)
Alkaline Phosphatase: 76 U/L (ref 40–150)
Anion Gap: 10 mEq/L (ref 3–11)
BILIRUBIN TOTAL: 0.35 mg/dL (ref 0.20–1.20)
BUN: 39 mg/dL — ABNORMAL HIGH (ref 7.0–26.0)
CALCIUM: 9.1 mg/dL (ref 8.4–10.4)
CHLORIDE: 115 meq/L — AB (ref 98–109)
CO2: 18 mEq/L — ABNORMAL LOW (ref 22–29)
Creatinine: 3.5 mg/dL (ref 0.7–1.3)
EGFR: 18 mL/min/{1.73_m2} — AB (ref >90)
Glucose: 130 mg/dl (ref 70–140)
POTASSIUM: 3.7 meq/L (ref 3.5–5.1)
Sodium: 143 mEq/L (ref 136–145)
Total Protein: 6.7 g/dL (ref 6.4–8.3)

## 2014-10-15 LAB — FLOW CYTOMETRY

## 2014-10-15 MED ORDER — ONDANSETRON HCL 8 MG PO TABS
ORAL_TABLET | ORAL | Status: AC
  Filled 2014-10-15: qty 1

## 2014-10-15 MED ORDER — CYCLOPHOSPHAMIDE 50 MG PO TABS: 500.0000 mg | ORAL_TABLET | ORAL | Status: DC

## 2014-10-15 MED ORDER — SODIUM CHLORIDE 0.9 % IV SOLN
Freq: Once | INTRAVENOUS | Status: AC
  Administered 2014-10-15: 14:00:00 via INTRAVENOUS
  Filled 2014-10-15: qty 4

## 2014-10-15 MED ORDER — SODIUM CHLORIDE 0.9 % IV SOLN
Freq: Once | INTRAVENOUS | Status: DC
  Filled 2014-10-15: qty 4

## 2014-10-15 MED ORDER — SODIUM CHLORIDE 0.9 % IV SOLN: Freq: Once | INTRAVENOUS | Status: DC

## 2014-10-15 MED ORDER — BORTEZOMIB CHEMO SQ INJECTION 3.5 MG (2.5MG/ML)
1.3000 mg/m2 | Freq: Once | INTRAMUSCULAR | Status: AC
  Administered 2014-10-15: 2.5 mg via SUBCUTANEOUS
  Filled 2014-10-15: qty 2.5

## 2014-10-15 MED ORDER — SODIUM CHLORIDE 0.9 % IV SOLN
1000.0000 mL | INTRAVENOUS | Status: DC
Start: 2014-10-15 — End: 2014-10-15
  Administered 2014-10-15: 1000 mL via INTRAVENOUS

## 2014-10-15 MED ORDER — SODIUM CHLORIDE 0.9 % IV SOLN
500.0000 mg | Freq: Once | INTRAVENOUS | Status: AC
  Administered 2014-10-15: 500 mg via INTRAVENOUS
  Filled 2014-10-15: qty 25

## 2014-10-15 MED ORDER — ONDANSETRON HCL 8 MG PO TABS
8.0000 mg | ORAL_TABLET | Freq: Once | ORAL | Status: AC
  Administered 2014-10-15: 8 mg via ORAL

## 2014-10-15 NOTE — Progress Notes (Signed)
Call from desk RN, ok to treat with IV Cytoxan and premeds as ordered, per MD  Pt unable to swallow Cytoxan capsules.

## 2014-10-15 NOTE — Telephone Encounter (Signed)
Cytoxan was phone in to [place]

## 2014-10-15 NOTE — Progress Notes (Signed)
Per [id number], patient approved for [facility] fund 400.00.

## 2014-10-15 NOTE — Progress Notes (Signed)
I left nurse mess. No asst with iv on cytoxan. See prev notes he does have  400.00 grant to asst. I found good days and they have funding to asst with copay for oral.

## 2014-10-15 NOTE — Patient Instructions (Signed)
Cyclophosphamide injection What is this medicine? CYCLOPHOSPHAMIDE (sye kloe FOSS fa mide) is a chemotherapy drug. It slows the growth of cancer cells. This medicine is used to treat many types of cancer like lymphoma, myeloma, leukemia, breast cancer, and ovarian cancer, to name a few. This medicine may be used for other purposes; ask your health care provider or pharmacist if you have questions. COMMON BRAND NAME(S): Cytoxan, Neosar What should I tell my health care provider before I take this medicine? They need to know if you have any of these conditions: -blood disorders -history of other chemotherapy -infection -kidney disease -liver disease -recent or ongoing radiation therapy -tumors in the bone marrow -an unusual or allergic reaction to cyclophosphamide, other chemotherapy, other medicines, foods, dyes, or preservatives -pregnant or trying to get pregnant -breast-feeding How should I use this medicine? This drug is usually given as an injection into a vein or muscle or by infusion into a vein. It is administered in a hospital or clinic by a specially trained health care professional. Talk to your pediatrician regarding the use of this medicine in children. Special care may be needed. Overdosage: If you think you have taken too much of this medicine contact a poison control center or emergency room at once. NOTE: This medicine is only for you. Do not share this medicine with others. What if I miss a dose? It is important not to miss your dose. Call your doctor or health care professional if you are unable to keep an appointment. What may interact with this medicine? This medicine may interact with the following medications: -amiodarone -amphotericin B -azathioprine -certain antiviral medicines for HIV or AIDS such as protease inhibitors (e.g., indinavir, ritonavir) and zidovudine -certain blood pressure medications such as benazepril, captopril, enalapril, fosinopril,  lisinopril, moexipril, monopril, perindopril, quinapril, ramipril, trandolapril -certain cancer medications such as anthracyclines (e.g., daunorubicin, doxorubicin), busulfan, cytarabine, paclitaxel, pentostatin, tamoxifen, trastuzumab -certain diuretics such as chlorothiazide, chlorthalidone, hydrochlorothiazide, indapamide, metolazone -certain medicines that treat or prevent blood clots like warfarin -certain muscle relaxants such as succinylcholine -cyclosporine -etanercept -indomethacin -medicines to increase blood counts like filgrastim, pegfilgrastim, sargramostim -medicines used as general anesthesia -metronidazole -natalizumab This list may not describe all possible interactions. Give your health care provider a list of all the medicines, herbs, non-prescription drugs, or dietary supplements you use. Also tell them if you smoke, drink alcohol, or use illegal drugs. Some items may interact with your medicine. What should I watch for while using this medicine? Visit your doctor for checks on your progress. This drug may make you feel generally unwell. This is not uncommon, as chemotherapy can affect healthy cells as well as cancer cells. Report any side effects. Continue your course of treatment even though you feel ill unless your doctor tells you to stop. Drink water or other fluids as directed. Urinate often, even at night. In some cases, you may be given additional medicines to help with side effects. Follow all directions for their use. Call your doctor or health care professional for advice if you get a fever, chills or sore throat, or other symptoms of a cold or flu. Do not treat yourself. This drug decreases your body's ability to fight infections. Try to avoid being around people who are sick. This medicine may increase your risk to bruise or bleed. Call your doctor or health care professional if you notice any unusual bleeding. Be careful brushing and flossing your teeth or using a  toothpick because you may get an infection or bleed   more easily. If you have any dental work done, tell your dentist you are receiving this medicine. You may get drowsy or dizzy. Do not drive, use machinery, or do anything that needs mental alertness until you know how this medicine affects you. Do not become pregnant while taking this medicine or for 1 year after stopping it. Women should inform their doctor if they wish to become pregnant or think they might be pregnant. Men should not father a child while taking this medicine and for 4 months after stopping it. There is a potential for serious side effects to an unborn child. Talk to your health care professional or pharmacist for more information. Do not breast-feed an infant while taking this medicine. This medicine may interfere with the ability to have a child. This medicine has caused ovarian failure in some women. This medicine has caused reduced sperm counts in some men. You should talk with your doctor or health care professional if you are concerned about your fertility. If you are going to have surgery, tell your doctor or health care professional that you have taken this medicine. What side effects may I notice from receiving this medicine? Side effects that you should report to your doctor or health care professional as soon as possible: -allergic reactions like skin rash, itching or hives, swelling of the face, lips, or tongue -low blood counts - this medicine may decrease the number of white blood cells, red blood cells and platelets. You may be at increased risk for infections and bleeding. -signs of infection - fever or chills, cough, sore throat, pain or difficulty passing urine -signs of decreased platelets or bleeding - bruising, pinpoint red spots on the skin, black, tarry stools, blood in the urine -signs of decreased red blood cells - unusually weak or tired, fainting spells, lightheadedness -breathing problems -dark  urine -dizziness -palpitations -swelling of the ankles, feet, hands -trouble passing urine or change in the amount of urine -weight gain -yellowing of the eyes or skin Side effects that usually do not require medical attention (report to your doctor or health care professional if they continue or are bothersome): -changes in nail or skin color -hair loss -missed menstrual periods -mouth sores -nausea, vomiting This list may not describe all possible side effects. Call your doctor for medical advice about side effects. You may report side effects to FDA at 1-800-FDA-1088. Where should I keep my medicine? This drug is given in a hospital or clinic and will not be stored at home. NOTE: This sheet is a summary. It may not cover all possible information. If you have questions about this medicine, talk to your doctor, pharmacist, or health care provider.  2015, [employer] Standard. (2012-01-11 16:22:58)

## 2014-10-18 LAB — KAPPA/LAMBDA LIGHT CHAINS
Kappa free light chain: 5.97 mg/dL — ABNORMAL HIGH (ref 0.33–1.94)
Kappa:Lambda Ratio: 1.39 (ref 0.26–1.65)
Lambda Free Lght Chn: 4.3 mg/dL — ABNORMAL HIGH (ref 0.57–2.63)

## 2014-10-18 LAB — APTT: APTT: 41 s — AB (ref 24–37)

## 2014-10-20 ENCOUNTER — Telehealth: Payer: Self-pay | Admitting: *Deleted

## 2014-10-20 NOTE — Telephone Encounter (Signed)
VM message received from patient @ 10:25 am regarding new constipation. Returned call to patient. He states he started his chemo last week-cytoxan. Last night he had trouble with constipation. Reviewed and discussed fluid intake needs, high fiber diet and the addition of OTC meds such as Miralax or Senna S. He voiced understanding. He states he would have some problem with increased fluid intake d/t kidney function but can and will increase fruit/vegetables and try OTC meds.  No other issues identified.

## 2014-10-21 ENCOUNTER — Telehealth: Payer: Self-pay | Admitting: Hematology

## 2014-10-21 NOTE — Telephone Encounter (Signed)
S/w pt confirming MD visit added for tomorrow per 08/01 POF .[name]... KJ

## 2014-10-22 ENCOUNTER — Ambulatory Visit (HOSPITAL_BASED_OUTPATIENT_CLINIC_OR_DEPARTMENT_OTHER): Payer: Self-pay

## 2014-10-22 ENCOUNTER — Other Ambulatory Visit: Payer: Self-pay | Admitting: *Deleted

## 2014-10-22 ENCOUNTER — Encounter: Payer: Self-pay | Admitting: Hematology

## 2014-10-22 ENCOUNTER — Ambulatory Visit (HOSPITAL_BASED_OUTPATIENT_CLINIC_OR_DEPARTMENT_OTHER): Payer: Self-pay | Admitting: Hematology

## 2014-10-22 ENCOUNTER — Other Ambulatory Visit (HOSPITAL_BASED_OUTPATIENT_CLINIC_OR_DEPARTMENT_OTHER): Payer: Self-pay

## 2014-10-22 ENCOUNTER — Telehealth: Payer: Self-pay | Admitting: Hematology

## 2014-10-22 VITALS — BP 167/61 | HR 66 | Temp 98.8°F | Resp 18 | Ht 68.5 in | Wt 181.1 lb

## 2014-10-22 DIAGNOSIS — E859 Amyloidosis, unspecified: Secondary | ICD-10-CM

## 2014-10-22 DIAGNOSIS — N183 Chronic kidney disease, stage 3 (moderate): Secondary | ICD-10-CM

## 2014-10-22 DIAGNOSIS — Z5112 Encounter for antineoplastic immunotherapy: Secondary | ICD-10-CM

## 2014-10-22 DIAGNOSIS — E858 Other amyloidosis: Secondary | ICD-10-CM

## 2014-10-22 DIAGNOSIS — E8581 Light chain (AL) amyloidosis: Secondary | ICD-10-CM

## 2014-10-22 DIAGNOSIS — Z5111 Encounter for antineoplastic chemotherapy: Secondary | ICD-10-CM

## 2014-10-22 DIAGNOSIS — D649 Anemia, unspecified: Secondary | ICD-10-CM

## 2014-10-22 LAB — CBC & DIFF AND RETIC
BASO%: 0.3 % (ref 0.0–2.0)
Basophils Absolute: 0 10*3/uL (ref 0.0–0.1)
EOS ABS: 0.2 10*3/uL (ref 0.0–0.5)
EOS%: 3.4 % (ref 0.0–7.0)
HCT: 28.1 % — ABNORMAL LOW (ref 38.4–49.9)
HGB: 9.6 g/dL — ABNORMAL LOW (ref 13.0–17.1)
Immature Retic Fract: 3 % (ref 3.00–10.60)
LYMPH%: 17.5 % (ref 14.0–49.0)
MCH: 29.3 pg (ref 27.2–33.4)
MCHC: 34.2 g/dL (ref 32.0–36.0)
MCV: 85.7 fL (ref 79.3–98.0)
MONO#: 0.5 10*3/uL (ref 0.1–0.9)
MONO%: 7.8 % (ref 0.0–14.0)
NEUT%: 71 % (ref 39.0–75.0)
NEUTROS ABS: 4.6 10*3/uL (ref 1.5–6.5)
Platelets: 200 10*3/uL (ref 140–400)
RBC: 3.28 10*6/uL — AB (ref 4.20–5.82)
RDW: 14.1 % (ref 11.0–14.6)
RETIC %: 0.81 % (ref 0.80–1.80)
Retic Ct Abs: 26.57 10*3/uL — ABNORMAL LOW (ref 34.80–93.90)
WBC: 6.5 10*3/uL (ref 4.0–10.3)
lymph#: 1.1 10*3/uL (ref 0.9–3.3)
nRBC: 0 % (ref 0–0)

## 2014-10-22 LAB — COMPREHENSIVE METABOLIC PANEL (CC13)
ALBUMIN: 3.5 g/dL (ref 3.5–5.0)
ALT: 12 U/L (ref 0–55)
AST: 12 U/L (ref 5–34)
Alkaline Phosphatase: 71 U/L (ref 40–150)
Anion Gap: 12 mEq/L — ABNORMAL HIGH (ref 3–11)
BUN: 41.7 mg/dL — ABNORMAL HIGH (ref 7.0–26.0)
CHLORIDE: 111 meq/L — AB (ref 98–109)
CO2: 16 mEq/L — ABNORMAL LOW (ref 22–29)
CREATININE: 3.2 mg/dL — AB (ref 0.7–1.3)
Calcium: 9.3 mg/dL (ref 8.4–10.4)
EGFR: 20 mL/min/{1.73_m2} — AB (ref >90)
GLUCOSE: 159 mg/dL — AB (ref 70–140)
POTASSIUM: 3.7 meq/L (ref 3.5–5.1)
Sodium: 140 mEq/L (ref 136–145)
Total Bilirubin: 0.46 mg/dL (ref 0.20–1.20)
Total Protein: 6.7 g/dL (ref 6.4–8.3)

## 2014-10-22 LAB — HOLD TUBE, BLOOD BANK

## 2014-10-22 MED ORDER — SODIUM CHLORIDE 0.9 % IV SOLN
Freq: Once | INTRAVENOUS | Status: AC
  Administered 2014-10-22: 10:00:00 via INTRAVENOUS
  Filled 2014-10-22: qty 4

## 2014-10-22 MED ORDER — SODIUM CHLORIDE 0.9 % IV SOLN
500.0000 mg | Freq: Once | INTRAVENOUS | Status: AC
  Administered 2014-10-22: 500 mg via INTRAVENOUS
  Filled 2014-10-22: qty 25

## 2014-10-22 MED ORDER — BORTEZOMIB CHEMO SQ INJECTION 3.5 MG (2.5MG/ML)
1.3000 mg/m2 | Freq: Once | INTRAMUSCULAR | Status: AC
  Administered 2014-10-22: 2.5 mg via SUBCUTANEOUS
  Filled 2014-10-22: qty 2.5

## 2014-10-22 MED ORDER — SODIUM CHLORIDE 0.9 % IV SOLN
Freq: Once | INTRAVENOUS | Status: AC
  Administered 2014-10-22: 10:00:00 via INTRAVENOUS

## 2014-10-22 NOTE — Patient Instructions (Signed)
-  Drink at least 1-1/2 to 2 L of water daily. -Report any new rashes, fevers or chills, worsening tingling and numbness in her hands or feet, discomfort passing urine.

## 2014-10-22 NOTE — Progress Notes (Signed)
Ok to treat with CMET per MD Kale 

## 2014-10-22 NOTE — Patient Instructions (Signed)
[facility] Discharge Instructions for Patients Receiving Chemotherapy  Today you received the following chemotherapy agents velcade and cytoxan  To help prevent nausea and vomiting after your treatment, we encourage you to take your nausea medication.   If you develop nausea and vomiting that is not controlled by your nausea medication, call the clinic.   BELOW ARE SYMPTOMS THAT SHOULD BE REPORTED IMMEDIATELY:  *FEVER GREATER THAN 100.5 F  *CHILLS WITH OR WITHOUT FEVER  NAUSEA AND VOMITING THAT IS NOT CONTROLLED WITH YOUR NAUSEA MEDICATION  *UNUSUAL SHORTNESS OF BREATH  *UNUSUAL BRUISING OR BLEEDING  TENDERNESS IN MOUTH AND THROAT WITH OR WITHOUT PRESENCE OF ULCERS  *URINARY PROBLEMS  *BOWEL PROBLEMS  UNUSUAL RASH Items with * indicate a potential emergency and should be followed up as soon as possible.  Feel free to call the clinic you have any questions or concerns. The clinic phone number is [phone].  Please show the [place] at check-in to the Emergency Department and triage nurse.

## 2014-10-22 NOTE — Telephone Encounter (Signed)
Appointments completed per 8/12 pof. Spoke with infusion patient will be given avs report in infusion area.

## 2014-10-25 NOTE — Progress Notes (Signed)
.    Hematology oncology clinic note  Date of service 10/22/2014  Patient Care Team: [name], MD as PCP - General (Family Medicine) [name], MD as PCP - Hematology/Oncology (Hematology and Oncology) [name], MD as Consulting Physician (Nephrology)  CHIEF COMPLAINTS: followup for AL amyloidosis.  HISTORY OF PRESENTING ILLNESS: Please see my initial consultation for details on presentation.  INTERVAL HISTORY  [name] is here for his one-week follow-up for toxicity check and prior to his second weekly dose of CyBorD. He notes no acute new concerns. Has been having some decreased sensation in his lower extremities even prior to treatment start. Nerve conduction study done on 10/12/2014 showed " very mild myopathic changes isolated to the quadriceps muscles bilaterally. No evidence of a generalized sensorimotor polyneuropathy or cervical/lumbosacral radiculopathy affecting the right side. Bilateral tibial and peroneal motor responses are markedly reduced suggesting likely localized compression from shoe wearing". He notes his energy levels and good. No other acute new concerns. No hematuria or urinary discomfort.    MEDICAL HISTORY:  Past Medical History  Diagnosis Date  . Hypertension Dx 2012  . Hyperlipidemia Dx 2012  . History of blood transfusion 09/08/2014    "got hematoma after renal biopsy & HgB dropped"  . Anemia Dx 2016  . Family history of adverse reaction to anesthesia     "my daughter can't take certain anesthesia agents" (09/08/2014)  . Enlarged heart 2015  . Chronic diastolic CHF (congestive heart failure), NYHA class 1     /hospital problem list 09/08/2014  . Renal disorder     "there is a spot on my kidney" (09/08/2014)  . Chronic renal disease, stage III     /hospital problem list 09/08/2014    SURGICAL HISTORY: Past Surgical History  Procedure Laterality Date  . Renal biopsy, percutaneous Right 09/08/2014  . Tonsillectomy  ~ 1960    . Laparoscopic cholecystectomy  03/2011    SOCIAL HISTORY: Social History   Social History  . Marital Status: Divorced    Spouse Name: N/A  . Number of Children: N/A  . Years of Education: N/A   Occupational History  . Not on file.   Social History Main Topics  . Smoking status: Former Smoker -- 2.00 packs/day for 40 years    Types: Cigarettes    Quit date: 03/19/2011  . Smokeless tobacco: Never Used  . Alcohol Use: No  . Drug Use: No     Comment: 09/08/2014 "I have used marijuana till the 1990's". Notes that he quit 15 yrs ago  . Sexual Activity: Not Currently   Other Topics Concern  . 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 Lives with his daughter and her finance.  FAMILY HISTORY: Family History  Problem Relation Age of Onset  . Hypertension Mother   . Diabetes Mother   . Heart disease Mother   . Skin cancer Mother   . Hypertension Father   . Diabetes Father   . Diabetes Sister     ALLERGIES:  has No Known Allergies.  MEDICATIONS:  Current Outpatient Prescriptions  Medication Sig Dispense Refill  . acyclovir (ZOVIRAX) 400 MG tablet Take 1 tablet (400 mg total) by mouth daily. (Dose adjusted for renal function) 60 tablet 3  . amLODipine (NORVASC) 10 MG tablet Take 1 tablet (10 mg total) by mouth daily. (Patient taking differently: Take 10 mg by mouth daily with lunch. ) 30 tablet 3  .  cyclophosphamide (CYTOXAN) 50 MG tablet Take 10 tablets (500 mg total) by mouth once a week. Give on an empty stomach 1 hour before or 2 hours after meals once weekly on days 1,8,15,22. 40 tablet 0  . dexamethasone (DECADRON) 4 MG tablet Take 10 tablets (40 mg total) by mouth once a week. On Day 1,8,15 and 22. 30-96mins prior to cyclophosphamide 40 tablet 1  . furosemide (LASIX) 40 MG tablet Take 40 mg by mouth 2 (two) times daily.    [name] gemfibrozil (LOPID) 600 MG tablet Take 1 tablet (600 mg total) by mouth 2  (two) times daily before a meal. 90 tablet 2  . hydrALAZINE (APRESOLINE) 50 MG tablet Take 1 tablet (50 mg total) by mouth 3 (three) times daily. 120 tablet 2  . LORazepam (ATIVAN) 1 MG tablet Take 1 tablet (1 mg total) by mouth every 8 (eight) hours as needed for anxiety (or nausea). 30 tablet 0  . metoprolol (LOPRESSOR) 100 MG tablet TAKE 1 TABLET BY MOUTH 2 TIMES A DAY 60 tablet 3  . ondansetron (ZOFRAN) 8 MG tablet Take 1 tablet (8 mg total) by mouth 2 (two) times daily as needed (Nausea or vomiting). 30 tablet 1  . prochlorperazine (COMPAZINE) 10 MG tablet Take 1 tablet (10 mg total) by mouth every 6 (six) hours as needed (Nausea or vomiting). 30 tablet 1  . vitamin E 100 UNIT capsule Take 100 Units by mouth every other day.      No current facility-administered medications for this visit.    REVIEW OF SYSTEMS:   Constitutional: Denies fevers, chills or abnormal night sweats Eyes: Denies blurriness of vision, double vision or watery eyes Ears, nose, mouth, throat, and face: Denies mucositis or sore throat Respiratory: Denies cough, dyspnea or wheezes Cardiovascular: Denies palpitation, chest discomfort or lower extremity swelling Gastrointestinal:  Denies nausea, heartburn or change in bowel habits Skin: Denies abnormal skin rashes Lymphatics: Denies new lymphadenopathy or easy bruising Neurological:Denies numbness, tingling or new weaknesses Behavioral/Psych: Mood is stable, no new changes  All other systems were reviewed with the patient and are negative.   PHYSICAL EXAMINATION: ECOG PERFORMANCE STATUS: 2 - Symptomatic, <50% confined to bed  Filed Vitals:   10/22/14 0903  BP: 167/61  Pulse: 66  Temp: 98.8 F (37.1 C)  Resp: 18   Filed Weights   10/22/14 0903  Weight: 181 lb 1.6 oz (82.146 kg)    GENERAL:alert, no distress , appears fatigued. SKIN: significant conjunctival pallor noted.  EYES: nl EOM, PERL OROPHARYNX:no exudate, no erythema and lips, buccal mucosa,  and tongue normal  NECK: no JVD LYMPH:  no palpable lymphadenopathy in the cervical, axillary or inguinal LUNGS: clear to auscultation and percussion with normal breathing effort HEART: regular rate & rhythm and 2/6 SM aortic area ABDOMEN:abdomen soft, non-tender and normal bowel sounds Musculoskeletal:b/l 1+ pedal edema PSYCH: alert & oriented x 3 with fluent speech NEURO: no focal motor/sensory deficits  LABORATORY DATA:  I have reviewed the data as listed. CBC Latest Ref Rng 10/22/2014 10/15/2014 10/11/2014  WBC 4.0 - 10.3 10e3/uL 6.5 4.8 5.2  Hemoglobin 13.0 - 17.1 g/dL 9.6(L) 9.6(L) 9.9(L)  Hematocrit 38.4 - 49.9 % 28.1(L) 28.4(L) 29.7(L)  Platelets 140 - 400 10e3/uL 200 181 166       Recent Labs  02/25/14 0959 03/02/14 1227 06/30/14 1528 08/18/14 1537 09/15/14 1539  10/04/14 1223 10/11/14 0824 10/15/14 0814 10/22/14 0844  NA 144 141 141 142 143  < > 145 139 143 140  K 4.9 5.0 4.2 3.8 4.3  < > 5.0 3.5 3.7 3.7  CL 115* 113* 109 110 108  --  111*  --   --   --   CO2 18* 18* 18* 19 16*  < > 21 19* 18* 16*  GLUCOSE 95 92 94 120* 113*  < > 111* 150* 130 159*  BUN 27* 26* 30* 44* 36*  < > 38* 38.5* 39.0* 41.7*  CREATININE 2.56* 2.38* 2.71* 2.97* 3.49*  < > 3.27* 3.4* 3.5* 3.2*  CALCIUM 9.0 9.3 9.3 9.0 8.6  < > 9.6 9.1 9.1 9.3  GFRNONAA 26* 28* 24*  --   --   --   --   --   --   --   GFRAA 30* 33* 28*  --   --   --   --   --   --   --   PROT 6.5 6.5 6.6  --   --   < >  --  6.8 6.7 6.7  ALBUMIN 3.7 3.8 4.0 4.1 3.7  < >  --  3.3* 3.3* 3.5  AST $Re'17 17 18  'JmY$ --   --   < >  --  $R'16 15 12  'OZ$ ALT $'10 11 11  'g$ --   --   < >  --  $R'12 12 12  'VZ$ ALKPHOS 62 66 71  --   --   < >  --  84 76 71  BILITOT 0.3 0.3 0.3  --   --   < >  --  0.43 0.35 0.46  < > = values in this interval not displayed. . Lab Results  Component Value Date   TOTALPROTELP 5.9* 09/24/2014   ALBUMINELP 3.1* 09/24/2014   A1GS 0.6* 09/24/2014   A2GS 0.8 09/24/2014   BETS 0.3* 09/24/2014   BETA2SER 0.4 09/24/2014   GAMS 0.7*  09/24/2014   SPEI * 09/24/2014   (this displays SPEP labs)  Lab Results  Component Value Date   KPAFRELGTCHN 5.97* 10/15/2014   LAMBDASER 4.30* 10/15/2014   KAPLAMBRATIO 1.39 10/15/2014   (kappa/lambda light chains)  BONE MARROW BIOPSY RESULTS 09/28/2014:   RADIOGRAPHIC STUDIES: I have personally reviewed the radiological images as listed and agreed with the findings in the report. Nm Pet Image Initial (pi) Whole Body  10/14/2014   CLINICAL DATA:  Initial treatment strategy for multiple myeloma.  EXAM: NUCLEAR MEDICINE PET SKULL BASE TO THIGH  TECHNIQUE: 9.5 mCi F-18 FDG was injected intravenously. Full-ring PET imaging was performed from the skull base to thigh after the radiotracer. CT data was obtained and used for attenuation correction and anatomic localization.  FASTING BLOOD GLUCOSE:  Value: 114 mg/dl  COMPARISON:  09/08/2014, noncontrast CT  FINDINGS: NECK  No hypermetabolic lymph nodes in the neck.  CHEST  No hypermetabolic mediastinal or hilar nodes. No suspicious pulmonary nodules on the CT scan.  ABDOMEN/PELVIS  No abnormal hypermetabolic activity within the liver, pancreas, adrenal glands, or spleen. No hypermetabolic lymph nodes in the abdomen or pelvis. 15 mm low-density lesion within the liver on image 135, series 4 does not have associated metabolic activity and likely represents a cyst. Some improvement in the perinephric hematoma on the RIGHT.  SKELETON  No focal hypermetabolic activity to suggest skeletal metastasis. No lytic lesion on the CT portion.  IMPRESSION: 1. No evidence of skeletal lesion to suggest multiple myeloma. 2. No soft tissue lesions to suggest plasmacytoma. 3. Interval improvement in RIGHT perinephric hematoma.   Electronically Signed  By: Suzy Bouchard M.D.   On: 10/14/2014 09:38   Ct Biopsy  09/28/2014   CLINICAL DATA:  64 year old male with renal amyloidosis, referred for bone marrow biopsy  EXAM: CT-GUIDED BIOPSY  MEDICATIONS AND MEDICAL HISTORY:  Versed 2.0 mg, Fentanyl 75 mcg.  Additional Medications: .  ANESTHESIA/SEDATION: Moderate sedation time: 8 minutes  PROCEDURE: The procedure risks, benefits, and alternatives were explained to the patient. Questions regarding the procedure were encouraged and answered. The patient understands and consents to the procedure.  Scout CT of the pelvis was performed for surgical planning purposes.  The posterior pelvis was prepped with Betadinein a sterile fashion, and a sterile drape was applied covering the operative field. A sterile gown and sterile gloves were used for the procedure. Local anesthesia was provided with 1% Lidocaine.  We targeted the right posterior iliac bone for biopsy. The skin and subcutaneous tissues were infiltrated with 1% lidocaine without epinephrine. A small stab incision was made with an 11 blade scalpel, and an 11 gauge Murphy needle was advanced with CT guidance to the posterior cortex. Manual forced was used to advance the needle through the posterior cortex and the stylet was removed. A bone marrow aspirate was retrieved and passed to a cytotechnologist in the room. The Murphy needle was then advanced without the stylet for a core biopsy. The core biopsy was retrieved and also passed to a cytotechnologist.  Manual pressure was used for hemostasis and a sterile dressing was placed.  No complications were encountered no significant blood loss was encountered.  Patient tolerated the procedure well and remained hemodynamically stable throughout.  FINDINGS: Scout image demonstrates safe approach to posterior iliac bone.  Images during the case demonstrate placement of 11 gauge Murphy needle  COMPLICATIONS: None  IMPRESSION: Status post CT-guided bone marrow biopsy, with tissue specimen sent to pathology for complete histopathologic analysis  Signed,  Dulcy Fanny. Earleen Newport, DO  [facility]  [facility]   Electronically Signed   By: Corrie Mckusick D.O.    On: 09/28/2014 15:29   Echocardiogram 10/07/2014  Study Conclusions  - Left ventricle: The cavity size was normal. Wall thickness was increased in a pattern of moderate LVH. There was mild focal basal hypertrophy of the septum. Systolic function was normal. The estimated ejection fraction was in the range of 55% to 60%. Wall motion was normal; there were no regional wall motion abnormalities. Features are consistent with a pseudonormal left ventricular filling pattern, with concomitant abnormal relaxation and increased filling pressure (grade 2 diastolic dysfunction). - Aortic valve: There was very mild stenosis. There was mild regurgitation. Valve area (VTI): 2.67 cm^2. Valve area (Vmax): 2.49 cm^2. Valve area (Vmean): 2.3 cm^2. - Mitral valve: There was mild regurgitation.  Impressions:  - Normal LV function; grade 2 diastolic dysfunction; mild to moderate LVH with proximal septal thickening; calcified aortic valve with very mild AS (mean gradient 10 mmHg) and mild AI; mild MR; trace TR. Global longitudinal strain - 17%.      ASSESSMENT & PLAN:    Mr Jose Garrett is very pleasant 64 yo caucasian male with   1) AL Amyloidosis noted on recent renal biopsy with nephrotic range proteinuria on urinalysis and some lambda free light chains on serum and urinary IFE . SPEP with small amount of M-spike 0.3  Echo showed no systolic dysfunction but grade 2 diastolic dysfunction. [facility] cardiac staging 1 as per troponin and BNP criteria. Bone marrow with no overt evidence of multiple myeloma.  PET/CT scan showed no evidence of bony lesions or lymphadenopathy . Urine showed about 15 g of protein in 24 hours  Plan 2) Symptomatic anemia - likely multifactorial -from AL amyloidosis, anemia from CKD, acute blood loss anemia due to large perinephric hematoma related to recent renal bx on 09/08/2014. Hematoma noted to be stable on rpt US renal on 09/17/2014. Hemoglobin  stable from last visit posttransfusion. Plan Patient is tolerating the CyBOrD regimen well thus far with no issues with overt toxicities at this time. Renal function and hemoglobin stable.  -Continue current regimen . -Return to care with Dr. [name] in 2 weeks prior to day 1 cycle 2 with CBC, CMP, SPEP, kappa- lambda light chains  and urine total protein creatinine ratio  - transfuse when necessary for hemoglobin less than 8. -I did rediscuss referral to [facility] for transplant evaluation patient wants to hold off at this time.  Continue close f/u with PCP and nephrology.   I appreciate the privilege of being a part of the medical care of this fantastic gentleman.  All questions were answered. The patient knows to call the clinic with any problems, questions or concerns. I spent 20 minutes counseling the patient face to face. The total time spent in the appointment was 25 minutes and more than 50% was on counseling.   Sullivan Lone MD [place] Hematology/Oncology Physician [facility]  (Office):       [phone] (Work cell):  [phone] (Fax):           [phone]

## 2014-10-28 ENCOUNTER — Other Ambulatory Visit: Payer: Self-pay

## 2014-10-28 ENCOUNTER — Ambulatory Visit: Payer: Self-pay | Admitting: Hematology

## 2014-10-29 ENCOUNTER — Other Ambulatory Visit: Payer: Self-pay

## 2014-10-29 ENCOUNTER — Other Ambulatory Visit (HOSPITAL_BASED_OUTPATIENT_CLINIC_OR_DEPARTMENT_OTHER): Payer: Self-pay

## 2014-10-29 ENCOUNTER — Ambulatory Visit: Payer: Self-pay | Admitting: Hematology

## 2014-10-29 ENCOUNTER — Ambulatory Visit (HOSPITAL_BASED_OUTPATIENT_CLINIC_OR_DEPARTMENT_OTHER): Payer: Self-pay

## 2014-10-29 ENCOUNTER — Encounter: Payer: Self-pay | Admitting: *Deleted

## 2014-10-29 VITALS — BP 167/62 | HR 60 | Temp 97.5°F | Resp 18

## 2014-10-29 DIAGNOSIS — E8581 Light chain (AL) amyloidosis: Secondary | ICD-10-CM

## 2014-10-29 DIAGNOSIS — E858 Other amyloidosis: Secondary | ICD-10-CM

## 2014-10-29 DIAGNOSIS — Z5112 Encounter for antineoplastic immunotherapy: Secondary | ICD-10-CM

## 2014-10-29 DIAGNOSIS — Z5111 Encounter for antineoplastic chemotherapy: Secondary | ICD-10-CM

## 2014-10-29 LAB — CBC WITH DIFFERENTIAL/PLATELET
BASO%: 0.6 % (ref 0.0–2.0)
Basophils Absolute: 0 10*3/uL (ref 0.0–0.1)
EOS%: 4.9 % (ref 0.0–7.0)
Eosinophils Absolute: 0.3 10*3/uL (ref 0.0–0.5)
HEMATOCRIT: 26.4 % — AB (ref 38.4–49.9)
HEMOGLOBIN: 9 g/dL — AB (ref 13.0–17.1)
LYMPH#: 1.1 10*3/uL (ref 0.9–3.3)
LYMPH%: 20.9 % (ref 14.0–49.0)
MCH: 29.5 pg (ref 27.2–33.4)
MCHC: 33.9 g/dL (ref 32.0–36.0)
MCV: 86.8 fL (ref 79.3–98.0)
MONO#: 0.5 10*3/uL (ref 0.1–0.9)
MONO%: 9.6 % (ref 0.0–14.0)
NEUT#: 3.4 10*3/uL (ref 1.5–6.5)
NEUT%: 64 % (ref 39.0–75.0)
PLATELETS: 167 10*3/uL (ref 140–400)
RBC: 3.04 10*6/uL — ABNORMAL LOW (ref 4.20–5.82)
RDW: 14.5 % (ref 11.0–14.6)
WBC: 5.2 10*3/uL (ref 4.0–10.3)

## 2014-10-29 LAB — COMPREHENSIVE METABOLIC PANEL (CC13)
ALBUMIN: 3.5 g/dL (ref 3.5–5.0)
ALK PHOS: 66 U/L (ref 40–150)
ALT: 12 U/L (ref 0–55)
ANION GAP: 12 meq/L — AB (ref 3–11)
AST: 13 U/L (ref 5–34)
BILIRUBIN TOTAL: 0.35 mg/dL (ref 0.20–1.20)
BUN: 56 mg/dL — AB (ref 7.0–26.0)
CO2: 16 mEq/L — ABNORMAL LOW (ref 22–29)
CREATININE: 3.4 mg/dL — AB (ref 0.7–1.3)
Calcium: 9.1 mg/dL (ref 8.4–10.4)
Chloride: 112 mEq/L — ABNORMAL HIGH (ref 98–109)
EGFR: 18 mL/min/{1.73_m2} — ABNORMAL LOW (ref >90)
Glucose: 110 mg/dl (ref 70–140)
Potassium: 3.9 mEq/L (ref 3.5–5.1)
Sodium: 140 mEq/L (ref 136–145)
Total Protein: 6.6 g/dL (ref 6.4–8.3)

## 2014-10-29 MED ORDER — BORTEZOMIB CHEMO SQ INJECTION 3.5 MG (2.5MG/ML)
1.3000 mg/m2 | Freq: Once | INTRAMUSCULAR | Status: AC
  Administered 2014-10-29: 2.5 mg via SUBCUTANEOUS
  Filled 2014-10-29: qty 2.5

## 2014-10-29 MED ORDER — SODIUM CHLORIDE 0.9 % IV SOLN
Freq: Once | INTRAVENOUS | Status: AC
  Administered 2014-10-29: 15:00:00 via INTRAVENOUS
  Filled 2014-10-29: qty 4

## 2014-10-29 MED ORDER — SODIUM CHLORIDE 0.9 % IV SOLN
500.0000 mg | Freq: Once | INTRAVENOUS | Status: AC
  Administered 2014-10-29: 500 mg via INTRAVENOUS
  Filled 2014-10-29: qty 25

## 2014-10-29 MED ORDER — SODIUM CHLORIDE 0.9 % IV SOLN
Freq: Once | INTRAVENOUS | Status: AC
  Administered 2014-10-29: 14:00:00 via INTRAVENOUS

## 2014-10-29 NOTE — Progress Notes (Signed)
[place] Work  Clinical Social Work was referred by Futures trader for assessment of psychosocial needs due to assistance with ss disability.  Clinical Social Worker met with patient at [facility] in infusion to offer support and assess for needs.  Pt living with family in the area and they are very supportive of him, bring to appointments, etc. Pt needing assistance with applying for ss disability. CSW discussed options available for assistance. Pt agrees to referral to [facility]. CSW faxed application to [facility] that pt completed. CSW educated pt about additional resources available to assist at [facility], such as support groups, options for transportation, etc. Pt very appreciative and CSW to check in with pt at next week's infusion.   Clinical Social Work interventions: SS Disability referral  Resource education Supportive listening  Loren Racer, [place] Worker [place]  [place] Phone: [phone] Fax: [phone]

## 2014-10-29 NOTE — Patient Instructions (Signed)
[facility] Discharge Instructions for Patients Receiving Chemotherapy  Today you received the following chemotherapy agents velcade and cytoxan  To help prevent nausea and vomiting after your treatment, we encourage you to take your nausea medication.   If you develop nausea and vomiting that is not controlled by your nausea medication, call the clinic.   BELOW ARE SYMPTOMS THAT SHOULD BE REPORTED IMMEDIATELY:  *FEVER GREATER THAN 100.5 F  *CHILLS WITH OR WITHOUT FEVER  NAUSEA AND VOMITING THAT IS NOT CONTROLLED WITH YOUR NAUSEA MEDICATION  *UNUSUAL SHORTNESS OF BREATH  *UNUSUAL BRUISING OR BLEEDING  TENDERNESS IN MOUTH AND THROAT WITH OR WITHOUT PRESENCE OF ULCERS  *URINARY PROBLEMS  *BOWEL PROBLEMS  UNUSUAL RASH Items with * indicate a potential emergency and should be followed up as soon as possible.  Feel free to call the clinic you have any questions or concerns. The clinic phone number is [phone].  Please show the [place] at check-in to the Emergency Department and triage nurse.

## 2014-10-29 NOTE — Progress Notes (Signed)
Labs reviewed with Dr. Alen Blew, ok to treat

## 2014-11-05 ENCOUNTER — Ambulatory Visit: Payer: Self-pay

## 2014-11-05 ENCOUNTER — Ambulatory Visit (HOSPITAL_BASED_OUTPATIENT_CLINIC_OR_DEPARTMENT_OTHER): Payer: Self-pay | Admitting: Hematology

## 2014-11-05 ENCOUNTER — Telehealth: Payer: Self-pay | Admitting: Hematology

## 2014-11-05 ENCOUNTER — Other Ambulatory Visit (HOSPITAL_BASED_OUTPATIENT_CLINIC_OR_DEPARTMENT_OTHER): Payer: Self-pay

## 2014-11-05 ENCOUNTER — Encounter: Payer: Self-pay | Admitting: Hematology

## 2014-11-05 ENCOUNTER — Ambulatory Visit (HOSPITAL_COMMUNITY)
Admission: RE | Admit: 2014-11-05 | Discharge: 2014-11-05 | Disposition: A | Discharge status: Home / Self Care | Payer: Self-pay | Source: Ambulatory Visit | Attending: Hematology | Admitting: Hematology

## 2014-11-05 ENCOUNTER — Other Ambulatory Visit: Payer: Self-pay | Admitting: *Deleted

## 2014-11-05 ENCOUNTER — Ambulatory Visit (HOSPITAL_BASED_OUTPATIENT_CLINIC_OR_DEPARTMENT_OTHER): Payer: Self-pay

## 2014-11-05 VITALS — BP 139/51 | HR 59 | Temp 97.5°F | Resp 18

## 2014-11-05 VITALS — BP 153/60 | HR 65 | Temp 98.5°F | Resp 18 | Ht 68.5 in | Wt 185.5 lb

## 2014-11-05 DIAGNOSIS — E859 Amyloidosis, unspecified: Secondary | ICD-10-CM

## 2014-11-05 DIAGNOSIS — E858 Other amyloidosis: Secondary | ICD-10-CM

## 2014-11-05 DIAGNOSIS — D649 Anemia, unspecified: Secondary | ICD-10-CM

## 2014-11-05 DIAGNOSIS — D631 Anemia in chronic kidney disease: Secondary | ICD-10-CM

## 2014-11-05 DIAGNOSIS — D638 Anemia in other chronic diseases classified elsewhere: Secondary | ICD-10-CM

## 2014-11-05 DIAGNOSIS — E8581 Light chain (AL) amyloidosis: Secondary | ICD-10-CM

## 2014-11-05 DIAGNOSIS — D500 Iron deficiency anemia secondary to blood loss (chronic): Secondary | ICD-10-CM

## 2014-11-05 DIAGNOSIS — Z5112 Encounter for antineoplastic immunotherapy: Secondary | ICD-10-CM

## 2014-11-05 DIAGNOSIS — N189 Chronic kidney disease, unspecified: Secondary | ICD-10-CM

## 2014-11-05 DIAGNOSIS — I129 Hypertensive chronic kidney disease with stage 1 through stage 4 chronic kidney disease, or unspecified chronic kidney disease: Secondary | ICD-10-CM

## 2014-11-05 LAB — COMPREHENSIVE METABOLIC PANEL (CC13)
ALT: 10 U/L (ref 0–55)
AST: 13 U/L (ref 5–34)
Albumin: 3.2 g/dL — ABNORMAL LOW (ref 3.5–5.0)
Alkaline Phosphatase: 62 U/L (ref 40–150)
Anion Gap: 10 mEq/L (ref 3–11)
BILIRUBIN TOTAL: 0.41 mg/dL (ref 0.20–1.20)
BUN: 41.1 mg/dL — AB (ref 7.0–26.0)
CO2: 19 meq/L — AB (ref 22–29)
Calcium: 9 mg/dL (ref 8.4–10.4)
Chloride: 113 mEq/L — ABNORMAL HIGH (ref 98–109)
Creatinine: 3 mg/dL (ref 0.7–1.3)
EGFR: 21 mL/min/{1.73_m2} — AB (ref >90)
GLUCOSE: 126 mg/dL (ref 70–140)
Potassium: 3.6 mEq/L (ref 3.5–5.1)
SODIUM: 142 meq/L (ref 136–145)
TOTAL PROTEIN: 6.1 g/dL — AB (ref 6.4–8.3)

## 2014-11-05 LAB — CBC WITH DIFFERENTIAL/PLATELET
BASO%: 0.2 % (ref 0.0–2.0)
Basophils Absolute: 0 10*3/uL (ref 0.0–0.1)
EOS%: 5.1 % (ref 0.0–7.0)
Eosinophils Absolute: 0.2 10*3/uL (ref 0.0–0.5)
HCT: 23 % — ABNORMAL LOW (ref 38.4–49.9)
HGB: 7.8 g/dL — ABNORMAL LOW (ref 13.0–17.1)
LYMPH%: 13.7 % — AB (ref 14.0–49.0)
MCH: 29.4 pg (ref 27.2–33.4)
MCHC: 34 g/dL (ref 32.0–36.0)
MCV: 86.5 fL (ref 79.3–98.0)
MONO#: 0.4 10*3/uL (ref 0.1–0.9)
MONO%: 9.2 % (ref 0.0–14.0)
NEUT%: 71.8 % (ref 39.0–75.0)
NEUTROS ABS: 3.5 10*3/uL (ref 1.5–6.5)
Platelets: 132 10*3/uL — ABNORMAL LOW (ref 140–400)
RBC: 2.66 10*6/uL — AB (ref 4.20–5.82)
RDW: 14.9 % — AB (ref 11.0–14.6)
WBC: 4.8 10*3/uL (ref 4.0–10.3)
lymph#: 0.7 10*3/uL — ABNORMAL LOW (ref 0.9–3.3)

## 2014-11-05 LAB — PREPARE RBC (CROSSMATCH)

## 2014-11-05 MED ORDER — DIPHENHYDRAMINE HCL 25 MG PO CAPS
ORAL_CAPSULE | ORAL | Status: AC
  Filled 2014-11-05: qty 2

## 2014-11-05 MED ORDER — DIPHENHYDRAMINE HCL 50 MG/ML IJ SOLN
25.0000 mg | Freq: Once | INTRAMUSCULAR | Status: AC
  Administered 2014-11-05: 25 mg via INTRAVENOUS

## 2014-11-05 MED ORDER — SODIUM CHLORIDE 0.9 % IV SOLN
250.0000 mL | Freq: Once | INTRAVENOUS | Status: AC
  Administered 2014-11-05: 250 mL via INTRAVENOUS

## 2014-11-05 MED ORDER — ACETAMINOPHEN 325 MG PO TABS
650.0000 mg | ORAL_TABLET | Freq: Once | ORAL | Status: AC
  Administered 2014-11-05: 650 mg via ORAL

## 2014-11-05 MED ORDER — FUROSEMIDE 10 MG/ML IJ SOLN: 20.0000 mg | Freq: Once | INTRAMUSCULAR | Status: DC

## 2014-11-05 MED ORDER — BORTEZOMIB CHEMO SQ INJECTION 3.5 MG (2.5MG/ML)
1.3000 mg/m2 | Freq: Once | INTRAMUSCULAR | Status: AC
  Administered 2014-11-05: 2.5 mg via SUBCUTANEOUS
  Filled 2014-11-05: qty 2.5

## 2014-11-05 MED ORDER — SODIUM CHLORIDE 0.9 % IV SOLN
Freq: Once | INTRAVENOUS | Status: AC
  Administered 2014-11-05: 11:00:00 via INTRAVENOUS

## 2014-11-05 MED ORDER — ACETAMINOPHEN 325 MG PO TABS
ORAL_TABLET | ORAL | Status: AC
  Filled 2014-11-05: qty 2

## 2014-11-05 MED ORDER — SODIUM CHLORIDE 0.9 % IV SOLN
Freq: Once | INTRAVENOUS | Status: AC
  Administered 2014-11-05: 11:00:00 via INTRAVENOUS
  Filled 2014-11-05: qty 4

## 2014-11-05 MED ORDER — DIPHENHYDRAMINE HCL 50 MG/ML IJ SOLN
INTRAMUSCULAR | Status: AC
  Filled 2014-11-05: qty 1

## 2014-11-05 NOTE — Patient Instructions (Addendum)
Anemia, Nonspecific Anemia is a condition in which the concentration of red blood cells or hemoglobin in the blood is below normal. Hemoglobin is a substance in red blood cells that carries oxygen to the tissues of the body. Anemia results in not enough oxygen reaching these tissues.  CAUSES  Common causes of anemia include:   Excessive bleeding. Bleeding may be internal or external. This includes excessive bleeding from periods (in women) or from the intestine.   Poor nutrition.   Chronic kidney, thyroid, and liver disease.  Bone marrow disorders that decrease red blood cell production.  Cancer and treatments for cancer.  HIV, AIDS, and their treatments.  Spleen problems that increase red blood cell destruction.  Blood disorders.  Excess destruction of red blood cells due to infection, medicines, and autoimmune disorders. SIGNS AND SYMPTOMS   Minor weakness.   Dizziness.   Headache.  Palpitations.   Shortness of breath, especially with exercise.   Paleness.  Cold sensitivity.  Indigestion.  Nausea.  Difficulty sleeping.  Difficulty concentrating. Symptoms may occur suddenly or they may develop slowly.  DIAGNOSIS  Additional blood tests are often needed. These help your health care provider determine the best treatment. Your health care provider will check your stool for blood and look for other causes of blood loss.  TREATMENT  Treatment varies depending on the cause of the anemia. Treatment can include:   Supplements of iron, vitamin 123456, or folic acid.   Hormone medicines.   A blood transfusion. This may be needed if blood loss is severe.   Hospitalization. This may be needed if there is significant continual blood loss.   Dietary changes.  Spleen removal. HOME CARE INSTRUCTIONS Keep all follow-up appointments. It often takes many weeks to correct anemia, and having your health care provider check on your condition and your response to  treatment is very important. SEEK IMMEDIATE MEDICAL CARE IF:   You develop extreme weakness, shortness of breath, or chest pain.   You become dizzy or have trouble concentrating.  You develop heavy vaginal bleeding.   You develop a rash.   You have bloody or black, tarry stools.   You faint.   You vomit up blood.   You vomit repeatedly.   You have abdominal pain.  You have a fever or persistent symptoms for more than 2-3 days.   You have a fever and your symptoms suddenly get worse.   You are dehydrated.  MAKE SURE YOU:  Understand these instructions.  Will watch your condition.  Will get help right away if you are not doing well or get worse. Document Released: 04/05/2004 Document Revised: 10/29/2012 Document Reviewed: 08/22/2012 [facility] Patient Information 2015 [place], [place]. This information is not intended to replace advice given to you by your health care provider. Make sure you discuss any questions you have with your health care provider.  Blood Transfusion  A blood transfusion replaces your blood or some of its parts. Blood is replaced when you have lost blood because of surgery, an accident, or for severe blood conditions like anemia. You can donate blood to be used on yourself if you have a planned surgery. If you lose blood during that surgery, your own blood can be given back to you. Any blood given to you is checked to make sure it matches your blood type. Your temperature, blood pressure, and heart rate (vital signs) will be checked often.  GET HELP RIGHT AWAY IF:   You feel sick to your stomach (nauseous)  or throw up (vomit).  You have watery poop (diarrhea).  You have shortness of breath or trouble breathing.  You have blood in your pee (urine) or have dark colored pee.  You have chest pain or tightness.  Your eyes or skin turn yellow (jaundice).  You have a temperature by mouth above 102 F (38.9 C), not controlled by  medicine.  You start to shake and have chills.  You develop a a red rash (hives) or feel itchy.  You develop lightheadedness or feel confused.  You develop back, joint, or muscle pain.  You do not feel hungry (lost appetite).  You feel tired, restless, or nervous.  You develop belly (abdominal) cramps. Document Released: 05/25/2008 Document Revised: 05/21/2011 Document Reviewed: 05/25/2008 [facility] Patient Information 2015 [place], [place]. This information is not intended to replace advice given to you by your health care provider. Make sure you discuss any questions you have with your health care provider.  [facility] Discharge Instructions for Patients Receiving Chemotherapy  Today you received the following chemotherapy agents Velcade  To help prevent nausea and vomiting after your treatment, we encourage you to take your nausea medication     If you develop nausea and vomiting that is not controlled by your nausea medication, call the clinic.   BELOW ARE SYMPTOMS THAT SHOULD BE REPORTED IMMEDIATELY: *FEVER GREATER THAN 100.5 F *CHILLS WITH OR WITHOUT FEVER NAUSEA AND VOMITING THAT IS NOT CONTROLLED WITH YOUR NAUSEA MEDICATION *UNUSUAL SHORTNESS OF BREATH *UNUSUAL BRUISING OR BLEEDING TENDERNESS IN MOUTH AND THROAT WITH OR WITHOUT PRESENCE OF ULCERS *URINARY PROBLEMS *BOWEL PROBLEMS UNUSUAL RASH Items with * indicate a potential emergency and should be followed up as soon as possible.  Feel free to call the clinic you have any questions or concerns. The clinic phone number is [phone].  Please show the [place] at check-in to the Emergency Department and triage nurse.

## 2014-11-05 NOTE — Progress Notes (Signed)
.    Hematology oncology clinic note  Date of service 10/22/2014  Patient Care Team: Jose Nearing, MD as PCP - General (Family Medicine) Jose Genera, MD as PCP - Hematology/Oncology (Hematology and Oncology) Jose Parish, MD as Consulting Physician (Nephrology)  CHIEF COMPLAINTS: followup for AL amyloidosis.  HISTORY OF PRESENTING ILLNESS: Please see my initial consultation for details on presentation.  INTERVAL HISTORY  [name] is here for his 2-week follow-up for monitoring of his CyBorD treatment. he notes no acute new concerns. Grade 1 treatment-related fatigue over the last couple of cycles and resolves a couple of days . Notes some increasing fatigue after the last cycle . Hemoglobin drifting down to 7.8 . No evidence of overt bleeding no abdominal pain no epistaxis no hematuria no blood in the stools .constipation resolved .overall in good spirits and has been trying to keep physically active .creatinine stable at 3 today .patient gave consent for blood transfusion for symptomatic anemia .cyclophosphamide dose to be held today and will dose reduce from next cycle No fevers or chills. No hematuria or urinary discomfort.  No tingling or numbness in his hands or feet . Does get some muscle cramping as before treatment with significant walking .    MEDICAL HISTORY:  Past Medical History  Diagnosis Date  . Hypertension Dx 2012  . Hyperlipidemia Dx 2012  . History of blood transfusion 09/08/2014    "got hematoma after renal biopsy & HgB dropped"  . Anemia Dx 2016  . Family history of adverse reaction to anesthesia     "my daughter can't take certain anesthesia agents" (09/08/2014)  . Enlarged heart 2015  . Chronic diastolic CHF (congestive heart failure), NYHA class 1     /hospital problem list 09/08/2014  . Renal disorder     "there is a spot on my kidney" (09/08/2014)  . Chronic renal disease, stage III     /hospital problem list 09/08/2014    SURGICAL  HISTORY: Past Surgical History  Procedure Laterality Date  . Renal biopsy, percutaneous Right 09/08/2014  . Tonsillectomy  ~ 1960  . Laparoscopic cholecystectomy  03/2011    SOCIAL HISTORY: Social History   Social History  . Marital Status: Divorced    Spouse Name: N/A  . Number of Children: N/A  . Years of Education: N/A   Occupational History  . Not on file.   Social History Main Topics  . Smoking status: Former Smoker -- 2.00 packs/day for 40 years    Types: Cigarettes    Quit date: 03/19/2011  . Smokeless tobacco: Never Used  . Alcohol Use: No  . Drug Use: No     Comment: 09/08/2014 "I have used marijuana till the 1990's". Notes that he quit 15 yrs ago  . Sexual Activity: Not Currently   Other Topics Concern  . 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 Lives with his daughter and her finance.  FAMILY HISTORY: Family History  Problem Relation Age of Onset  . Hypertension Mother   . Diabetes Mother   . Heart disease Mother   . Skin cancer Mother   . Hypertension Father   . Diabetes Father   . Diabetes Sister     ALLERGIES:  has No Known Allergies.  MEDICATIONS:  Current Outpatient Prescriptions  Medication Sig Dispense Refill  . acyclovir (ZOVIRAX) 400 MG tablet Take 1 tablet (400 mg total) by mouth daily. (Dose adjusted for renal  function) 60 tablet 3  . amLODipine (NORVASC) 10 MG tablet Take 1 tablet (10 mg total) by mouth daily. (Patient taking differently: Take 10 mg by mouth daily with lunch. ) 30 tablet 3  . dexamethasone (DECADRON) 4 MG tablet Take 10 tablets (40 mg total) by mouth once a week. On Day 1,8,15 and 22. 30-59mins prior to cyclophosphamide 40 tablet 1  . furosemide (LASIX) 40 MG tablet Take 40 mg by mouth 2 (two) times daily.    [name] gemfibrozil (LOPID) 600 MG tablet Take 1 tablet (600 mg total) by mouth 2 (two) times daily before a meal. 90 tablet 2  . hydrALAZINE  (APRESOLINE) 50 MG tablet Take 1 tablet (50 mg total) by mouth 3 (three) times daily. 120 tablet 2  . LORazepam (ATIVAN) 1 MG tablet Take 1 tablet (1 mg total) by mouth every 8 (eight) hours as needed for anxiety (or nausea). 30 tablet 0  . metoprolol (LOPRESSOR) 100 MG tablet TAKE 1 TABLET BY MOUTH 2 TIMES A DAY 60 tablet 3  . ondansetron (ZOFRAN) 8 MG tablet Take 1 tablet (8 mg total) by mouth 2 (two) times daily as needed (Nausea or vomiting). 30 tablet 1  . prochlorperazine (COMPAZINE) 10 MG tablet Take 1 tablet (10 mg total) by mouth every 6 (six) hours as needed (Nausea or vomiting). 30 tablet 1  . vitamin E 100 UNIT capsule Take 100 Units by mouth every other day.      No current facility-administered medications for this visit.    REVIEW OF SYSTEMS:   Constitutional: Denies fevers, chills or abnormal night sweats Eyes: Denies blurriness of vision, double vision or watery eyes Ears, nose, mouth, throat, and face: Denies mucositis or sore throat Respiratory: Denies cough, dyspnea or wheezes Cardiovascular: Denies palpitation, chest discomfort or lower extremity swelling Gastrointestinal:  Denies nausea, heartburn or change in bowel habits Skin: Denies abnormal skin rashes Lymphatics: Denies new lymphadenopathy or easy bruising Neurological:Denies numbness, tingling or new weaknesses Behavioral/Psych: Mood is stable, no new changes  All other systems were reviewed with the patient and are negative.   PHYSICAL EXAMINATION: ECOG PERFORMANCE STATUS: 2 - Symptomatic, <50% confined to bed  Filed Vitals:   11/05/14 0852  BP: 153/60  Pulse: 65  Temp: 98.5 F (36.9 C)  Resp: 18   Filed Weights   11/05/14 0852  Weight: 185 lb 8 oz (84.142 kg)    GENERAL:alert, no distress , appears to be in good spirits and is smiling and quite verbal . SKIN: significant conjunctival pallor noted.  EYES: nl EOM, PERL OROPHARYNX:no exudate, no erythema and lips, buccal mucosa, and tongue normal    NECK: no JVD LYMPH:  no palpable lymphadenopathy in the cervical, axillary or inguinal LUNGS: clear to auscultation and percussion with normal breathing effort HEART: regular rate & rhythm and 2/6 SM aortic area ABDOMEN:abdomen soft, non-tender and normal bowel sounds Musculoskeletal: B/l 1+ pedal edemaredness or tenderness  PSYCH: alert & oriented x 3 with fluent speech NEURO: no focal motor/sensory deficits  LABORATORY DATA:   I have reviewed the data as listed. CBC Latest Ref Rng 11/05/2014 10/29/2014 10/22/2014  WBC 4.0 - 10.3 10e3/uL 4.8 5.2 6.5  Hemoglobin 13.0 - 17.1 g/dL 7.8(L) 9.0(L) 9.6(L)  Hematocrit 38.4 - 49.9 % 23.0(L) 26.4(L) 28.1(L)  Platelets 140 - 400 10e3/uL 132(L) 167 200       Recent Labs  02/25/14 0959 03/02/14 1227 06/30/14 1528 08/18/14 1537 09/15/14 1539  10/04/14 1223  10/22/14 0844 10/29/14 1244 11/05/14  0815  NA 144 141 141 142 143  < > 145  < > 140 140 142  K 4.9 5.0 4.2 3.8 4.3  < > 5.0  < > 3.7 3.9 3.6  CL 115* 113* 109 110 108  --  111*  --   --   --   --   CO2 18* 18* 18* 19 16*  < > 21  < > 16* 16* 19*  GLUCOSE 95 92 94 120* 113*  < > 111*  < > 159* 110 126  BUN 27* 26* 30* 44* 36*  < > 38*  < > 41.7* 56.0* 41.1*  CREATININE 2.56* 2.38* 2.71* 2.97* 3.49*  < > 3.27*  < > 3.2* 3.4* 3.0*  CALCIUM 9.0 9.3 9.3 9.0 8.6  < > 9.6  < > 9.3 9.1 9.0  GFRNONAA 26* 28* 24*  --   --   --   --   --   --   --   --   GFRAA 30* 33* 28*  --   --   --   --   --   --   --   --   PROT 6.5 6.5 6.6  --   --   < >  --   < > 6.7 6.6 6.1*  ALBUMIN 3.7 3.8 4.0 4.1 3.7  < >  --   < > 3.5 3.5 3.2*  AST $Re'17 17 18  'moe$ --   --   < >  --   < > $R'12 13 13  'LJ$ ALT $'10 11 11  'A$ --   --   < >  --   < > $R'12 12 10  'IF$ ALKPHOS 62 66 71  --   --   < >  --   < > 71 66 62  BILITOT 0.3 0.3 0.3  --   --   < >  --   < > 0.46 0.35 0.41  < > = values in this interval not displayed. . Lab Results  Component Value Date   TOTALPROTELP 5.9* 09/24/2014   ALBUMINELP 3.1* 09/24/2014   A1GS 0.6*  09/24/2014   A2GS 0.8 09/24/2014   BETS 0.3* 09/24/2014   BETA2SER 0.4 09/24/2014   GAMS 0.7* 09/24/2014   SPEI * 09/24/2014   (this displays SPEP labs)  Lab Results  Component Value Date   KPAFRELGTCHN 5.97* 10/15/2014   LAMBDASER 4.30* 10/15/2014   KAPLAMBRATIO 1.39 10/15/2014   (kappa/lambda light chains)  BONE MARROW BIOPSY RESULTS 09/28/2014:   RADIOGRAPHIC STUDIES: I have personally reviewed the radiological images as listed and agreed with the findings in the report. Nm Pet Image Initial (pi) Whole Body  10/14/2014   CLINICAL DATA:  Initial treatment strategy for multiple myeloma.  EXAM: NUCLEAR MEDICINE PET SKULL BASE TO THIGH  TECHNIQUE: 9.5 mCi F-18 FDG was injected intravenously. Full-ring PET imaging was performed from the skull base to thigh after the radiotracer. CT data was obtained and used for attenuation correction and anatomic localization.  FASTING BLOOD GLUCOSE:  Value: 114 mg/dl  COMPARISON:  09/08/2014, noncontrast CT  FINDINGS: NECK  No hypermetabolic lymph nodes in the neck.  CHEST  No hypermetabolic mediastinal or hilar nodes. No suspicious pulmonary nodules on the CT scan.  ABDOMEN/PELVIS  No abnormal hypermetabolic activity within the liver, pancreas, adrenal glands, or spleen. No hypermetabolic lymph nodes in the abdomen or pelvis. 15 mm low-density lesion within the liver on image 135, series 4 does not have associated metabolic  activity and likely represents a cyst. Some improvement in the perinephric hematoma on the RIGHT.  SKELETON  No focal hypermetabolic activity to suggest skeletal metastasis. No lytic lesion on the CT portion.  IMPRESSION: 1. No evidence of skeletal lesion to suggest multiple myeloma. 2. No soft tissue lesions to suggest plasmacytoma. 3. Interval improvement in RIGHT perinephric hematoma.   Electronically Signed   By: Suzy Bouchard M.D.   On: 10/14/2014 09:38   Echocardiogram 10/07/2014  Study Conclusions  - Left ventricle: The cavity  size was normal. Wall thickness was increased in a pattern of moderate LVH. There was mild focal basal hypertrophy of the septum. Systolic function was normal. The estimated ejection fraction was in the range of 55% to 60%. Wall motion was normal; there were no regional wall motion abnormalities. Features are consistent with a pseudonormal left ventricular filling pattern, with concomitant abnormal relaxation and increased filling pressure (grade 2 diastolic dysfunction). - Aortic valve: There was very mild stenosis. There was mild regurgitation. Valve area (VTI): 2.67 cm^2. Valve area (Vmax): 2.49 cm^2. Valve area (Vmean): 2.3 cm^2. - Mitral valve: There was mild regurgitation.  Impressions:  - Normal LV function; grade 2 diastolic dysfunction; mild to moderate LVH with proximal septal thickening; calcified aortic valve with very mild AS (mean gradient 10 mmHg) and mild AI; mild MR; trace TR. Global longitudinal strain - 17%.      ASSESSMENT & PLAN:    Mr Jose Garrett is very pleasant 64 yo caucasian male with   1) AL Amyloidosis noted on recent renal biopsy with nephrotic range proteinuria on urinalysis and some lambda free light chains on serum and urinary IFE . SPEP with small amount of M-spike 0.3  Echo showed no systolic dysfunction but grade 2 diastolic dysfunction. [facility] cardiac staging 1 as per troponin and BNP criteria. Bone marrow with no overt evidence of multiple myeloma. PET/CT scan showed no evidence of bony lesions or lymphadenopathy . Urine showed about 15 g of protein in 24 hours  2) Symptomatic anemia - likely multifactorial -from AL amyloidosis, anemia from CKD, acute blood loss anemia due to large perinephric hematoma related to recent renal bx on 09/08/2014. Hematoma noted to be stable on rpt US renal on 09/17/2014. 3) CKD -related to hypertension and, Amyloidosis. Creatinine is 3 today and is improved from his previous high of 3.5.   Plan  -Patient now gradually becoming more anemic again likely now related to his cyclophosphamide chemotherapy in addition to his baseline anemia of chronic disease from CKD. No overt evidence of bleeding. -We'll transfuse the patient 1 unit of PRBC today with some IV Lasix -Will hold the cyclophosphamide this cycle, may get his Velcade and dexamethasone. -We'll reduce cyclophosphamide to 300 mg weekly from his current dose of 500 mg and monitor blood counts. -If he is getting more anemic again post transfusion even with dose reduction might need to consider Aranesp to treat anemia from his CKD. -Patient will get CBC, CMP weekly . We will get an SPEP/serum free light chains and urine total protein creatinine ratio next week for assessment of response. -we will continue rediscussing option for referral to [facility] for transplant evaluation.. patient wants to hold off at this time.  -Continue close f/u with PCP and nephrology. -Will need his blood pressure well controlled to allow for Aranesp use.   Return to care with Dr. [name] in 2 weeks    I appreciate the privilege of being a part of the medical care of  this fantastic gentleman.  All questions were answered. The patient knows to call the clinic with any problems, questions or concerns. I spent 20 minutes counseling the patient face to face. The total time spent in the appointment was 25 minutes and more than 50% was on counseling.   Sullivan Lone MD [place] Hematology/Oncology Physician [facility]  (Office):       [phone] (Work cell):  [phone] (Fax):           [phone]

## 2014-11-05 NOTE — Progress Notes (Signed)
No cytoxan today only velcade per MD [name]. 1 unit of PRBCs to be given. Charge nurse notified and verbalized understanding.

## 2014-11-05 NOTE — Telephone Encounter (Signed)
Gave and printed appt sched and avs for pt for Aug thru OCT °

## 2014-11-07 LAB — TYPE AND SCREEN
ABO/RH(D): O POS
Antibody Screen: NEGATIVE
UNIT DIVISION: 0

## 2014-11-07 LAB — ERYTHROPOIETIN: ERYTHROPOIETIN: 8.3 m[IU]/mL (ref 2.6–18.5)

## 2014-11-12 ENCOUNTER — Other Ambulatory Visit: Payer: Self-pay | Admitting: *Deleted

## 2014-11-12 ENCOUNTER — Ambulatory Visit (HOSPITAL_BASED_OUTPATIENT_CLINIC_OR_DEPARTMENT_OTHER): Payer: Self-pay

## 2014-11-12 VITALS — BP 164/62 | HR 67 | Temp 97.0°F

## 2014-11-12 DIAGNOSIS — E858 Other amyloidosis: Secondary | ICD-10-CM

## 2014-11-12 DIAGNOSIS — E8581 Light chain (AL) amyloidosis: Secondary | ICD-10-CM

## 2014-11-12 DIAGNOSIS — Z5112 Encounter for antineoplastic immunotherapy: Secondary | ICD-10-CM

## 2014-11-12 LAB — CBC WITH DIFFERENTIAL/PLATELET
BASO%: 0.3 % (ref 0.0–2.0)
Basophils Absolute: 0 10*3/uL (ref 0.0–0.1)
EOS%: 12.9 % — ABNORMAL HIGH (ref 0.0–7.0)
Eosinophils Absolute: 0.8 10*3/uL — ABNORMAL HIGH (ref 0.0–0.5)
HEMATOCRIT: 26.7 % — AB (ref 38.4–49.9)
HEMOGLOBIN: 9 g/dL — AB (ref 13.0–17.1)
LYMPH#: 0.7 10*3/uL — AB (ref 0.9–3.3)
LYMPH%: 10.9 % — ABNORMAL LOW (ref 14.0–49.0)
MCH: 29.7 pg (ref 27.2–33.4)
MCHC: 33.7 g/dL (ref 32.0–36.0)
MCV: 88.1 fL (ref 79.3–98.0)
MONO#: 0.7 10*3/uL (ref 0.1–0.9)
MONO%: 11.7 % (ref 0.0–14.0)
NEUT%: 64.2 % (ref 39.0–75.0)
NEUTROS ABS: 3.9 10*3/uL (ref 1.5–6.5)
NRBC: 0 % (ref 0–0)
Platelets: 135 10*3/uL — ABNORMAL LOW (ref 140–400)
RBC: 3.03 10*6/uL — ABNORMAL LOW (ref 4.20–5.82)
RDW: 15.4 % — AB (ref 11.0–14.6)
WBC: 6.1 10*3/uL (ref 4.0–10.3)

## 2014-11-12 LAB — COMPREHENSIVE METABOLIC PANEL (CC13)
ALBUMIN: 3.2 g/dL — AB (ref 3.5–5.0)
ALK PHOS: 62 U/L (ref 40–150)
ALT: 9 U/L (ref 0–55)
AST: 12 U/L (ref 5–34)
Anion Gap: 10 mEq/L (ref 3–11)
BUN: 44.2 mg/dL — AB (ref 7.0–26.0)
CALCIUM: 9.1 mg/dL (ref 8.4–10.4)
CO2: 20 mEq/L — ABNORMAL LOW (ref 22–29)
CREATININE: 2.9 mg/dL — AB (ref 0.7–1.3)
Chloride: 111 mEq/L — ABNORMAL HIGH (ref 98–109)
EGFR: 22 mL/min/{1.73_m2} — ABNORMAL LOW (ref >90)
GLUCOSE: 109 mg/dL (ref 70–140)
Potassium: 3.4 mEq/L — ABNORMAL LOW (ref 3.5–5.1)
Sodium: 141 mEq/L (ref 136–145)
TOTAL PROTEIN: 6.1 g/dL — AB (ref 6.4–8.3)
Total Bilirubin: 0.43 mg/dL (ref 0.20–1.20)

## 2014-11-12 LAB — PROTEIN / CREATININE RATIO, URINE
CREATININE, URINE: 42 mg/dL
Protein Creatinine Ratio: 7.4 — ABNORMAL HIGH (ref <0.15)
Total Protein, Urine: 311 mg/dL — ABNORMAL HIGH (ref 5–25)

## 2014-11-12 LAB — TECHNOLOGIST REVIEW

## 2014-11-12 LAB — HOLD TUBE, BLOOD BANK

## 2014-11-12 MED ORDER — CYCLOPHOSPHAMIDE CHEMO INJECTION 1 GM
300.0000 mg | Freq: Once | INTRAMUSCULAR | Status: AC
  Administered 2014-11-12: 300 mg via INTRAVENOUS
  Filled 2014-11-12: qty 15

## 2014-11-12 MED ORDER — SODIUM CHLORIDE 0.9 % IV SOLN
Freq: Once | INTRAVENOUS | Status: AC
  Administered 2014-11-12: 10:00:00 via INTRAVENOUS

## 2014-11-12 MED ORDER — SODIUM CHLORIDE 0.9 % IV SOLN
Freq: Once | INTRAVENOUS | Status: AC
  Administered 2014-11-12: 10:00:00 via INTRAVENOUS
  Filled 2014-11-12: qty 4

## 2014-11-12 MED ORDER — BORTEZOMIB CHEMO SQ INJECTION 3.5 MG (2.5MG/ML)
1.3000 mg/m2 | Freq: Once | INTRAMUSCULAR | Status: AC
  Administered 2014-11-12: 2.5 mg via SUBCUTANEOUS
  Filled 2014-11-12: qty 2.5

## 2014-11-12 NOTE — Patient Instructions (Signed)
[facility] Discharge Instructions for Patients Receiving Chemotherapy  Today you received the following chemotherapy agents: Cytoxan and Velcade.  To help prevent nausea and vomiting after your treatment, we encourage you to take your nausea medication:Zofran 8 mg every 12 hours as needed, Compazine 10 mg every 6 hours as needed.   If you develop nausea and vomiting that is not controlled by your nausea medication, call the clinic.   BELOW ARE SYMPTOMS THAT SHOULD BE REPORTED IMMEDIATELY:  *FEVER GREATER THAN 100.5 F  *CHILLS WITH OR WITHOUT FEVER  NAUSEA AND VOMITING THAT IS NOT CONTROLLED WITH YOUR NAUSEA MEDICATION  *UNUSUAL SHORTNESS OF BREATH  *UNUSUAL BRUISING OR BLEEDING  TENDERNESS IN MOUTH AND THROAT WITH OR WITHOUT PRESENCE OF ULCERS  *URINARY PROBLEMS  *BOWEL PROBLEMS  UNUSUAL RASH Items with * indicate a potential emergency and should be followed up as soon as possible.  Feel free to call the clinic you have any questions or concerns. The clinic phone number is [phone].  Please show the [place] at check-in to the Emergency Department and triage nurse.

## 2014-11-17 ENCOUNTER — Other Ambulatory Visit: Payer: Self-pay | Admitting: Hematology

## 2014-11-17 ENCOUNTER — Other Ambulatory Visit: Payer: Self-pay | Admitting: *Deleted

## 2014-11-17 DIAGNOSIS — E8581 Light chain (AL) amyloidosis: Secondary | ICD-10-CM

## 2014-11-17 LAB — SPEP & IFE WITH QIG
ALBUMIN ELP: 3.5 g/dL — AB (ref 3.8–4.8)
ALPHA-1-GLOBULIN: 0.3 g/dL (ref 0.2–0.3)
Abnormal Protein Band1: 0.1 g/dL
Alpha-2-Globulin: 0.9 g/dL (ref 0.5–0.9)
BETA 2: 0.3 g/dL (ref 0.2–0.5)
Beta Globulin: 0.4 g/dL (ref 0.4–0.6)
GAMMA GLOBULIN: 0.4 g/dL — AB (ref 0.8–1.7)
IGA: 58 mg/dL — AB (ref 68–379)
IGG (IMMUNOGLOBIN G), SERUM: 407 mg/dL — AB (ref 650–1600)
IGM, SERUM: 93 mg/dL (ref 41–251)
Total Protein, Serum Electrophoresis: 5.7 g/dL — ABNORMAL LOW (ref 6.1–8.1)

## 2014-11-19 ENCOUNTER — Telehealth: Payer: Self-pay | Admitting: Hematology

## 2014-11-19 ENCOUNTER — Encounter: Payer: Self-pay | Admitting: Hematology

## 2014-11-19 ENCOUNTER — Other Ambulatory Visit (HOSPITAL_BASED_OUTPATIENT_CLINIC_OR_DEPARTMENT_OTHER): Payer: Self-pay

## 2014-11-19 ENCOUNTER — Ambulatory Visit (HOSPITAL_BASED_OUTPATIENT_CLINIC_OR_DEPARTMENT_OTHER): Payer: Self-pay | Admitting: Hematology

## 2014-11-19 ENCOUNTER — Ambulatory Visit (HOSPITAL_BASED_OUTPATIENT_CLINIC_OR_DEPARTMENT_OTHER): Payer: Self-pay

## 2014-11-19 VITALS — BP 161/61 | HR 64 | Temp 98.7°F | Resp 18 | Ht 68.5 in | Wt 185.1 lb

## 2014-11-19 DIAGNOSIS — Z5112 Encounter for antineoplastic immunotherapy: Secondary | ICD-10-CM

## 2014-11-19 DIAGNOSIS — E8581 Light chain (AL) amyloidosis: Secondary | ICD-10-CM

## 2014-11-19 DIAGNOSIS — E858 Other amyloidosis: Secondary | ICD-10-CM

## 2014-11-19 DIAGNOSIS — G629 Polyneuropathy, unspecified: Secondary | ICD-10-CM

## 2014-11-19 LAB — COMPREHENSIVE METABOLIC PANEL (CC13)
ALBUMIN: 3.2 g/dL — AB (ref 3.5–5.0)
ALK PHOS: 67 U/L (ref 40–150)
ALT: 14 U/L (ref 0–55)
ANION GAP: 12 meq/L — AB (ref 3–11)
AST: 15 U/L (ref 5–34)
BILIRUBIN TOTAL: 0.43 mg/dL (ref 0.20–1.20)
BUN: 36.6 mg/dL — ABNORMAL HIGH (ref 7.0–26.0)
CALCIUM: 8.9 mg/dL (ref 8.4–10.4)
CO2: 18 mEq/L — ABNORMAL LOW (ref 22–29)
CREATININE: 3.1 mg/dL — AB (ref 0.7–1.3)
Chloride: 110 mEq/L — ABNORMAL HIGH (ref 98–109)
EGFR: 20 mL/min/{1.73_m2} — ABNORMAL LOW (ref >90)
Glucose: 128 mg/dl (ref 70–140)
Potassium: 3.4 mEq/L — ABNORMAL LOW (ref 3.5–5.1)
Sodium: 140 mEq/L (ref 136–145)
TOTAL PROTEIN: 6.2 g/dL — AB (ref 6.4–8.3)

## 2014-11-19 LAB — CBC WITH DIFFERENTIAL/PLATELET
BASO%: 0.5 % (ref 0.0–2.0)
Basophils Absolute: 0 10*3/uL (ref 0.0–0.1)
EOS%: 16 % — ABNORMAL HIGH (ref 0.0–7.0)
Eosinophils Absolute: 1 10*3/uL — ABNORMAL HIGH (ref 0.0–0.5)
HEMATOCRIT: 27.5 % — AB (ref 38.4–49.9)
HGB: 9.2 g/dL — ABNORMAL LOW (ref 13.0–17.1)
LYMPH#: 0.7 10*3/uL — AB (ref 0.9–3.3)
LYMPH%: 11.5 % — ABNORMAL LOW (ref 14.0–49.0)
MCH: 29.8 pg (ref 27.2–33.4)
MCHC: 33.4 g/dL (ref 32.0–36.0)
MCV: 89.3 fL (ref 79.3–98.0)
MONO#: 0.6 10*3/uL (ref 0.1–0.9)
MONO%: 9.9 % (ref 0.0–14.0)
NEUT%: 62.1 % (ref 39.0–75.0)
NEUTROS ABS: 3.8 10*3/uL (ref 1.5–6.5)
PLATELETS: 145 10*3/uL (ref 140–400)
RBC: 3.08 10*6/uL — ABNORMAL LOW (ref 4.20–5.82)
RDW: 15.6 % — ABNORMAL HIGH (ref 11.0–14.6)
WBC: 6.1 10*3/uL (ref 4.0–10.3)

## 2014-11-19 LAB — TECHNOLOGIST REVIEW

## 2014-11-19 MED ORDER — SODIUM CHLORIDE 0.9 % IV SOLN
300.0000 mg | Freq: Once | INTRAVENOUS | Status: AC
  Administered 2014-11-19: 300 mg via INTRAVENOUS
  Filled 2014-11-19: qty 15

## 2014-11-19 MED ORDER — B COMPLEX VITAMINS PO CAPS: 1.0000 | ORAL_CAPSULE | Freq: Every day | ORAL | Status: DC

## 2014-11-19 MED ORDER — SODIUM CHLORIDE 0.9 % IV SOLN
Freq: Once | INTRAVENOUS | Status: AC
  Administered 2014-11-19: 10:00:00 via INTRAVENOUS

## 2014-11-19 MED ORDER — GABAPENTIN 100 MG PO CAPS: 200.0000 mg | ORAL_CAPSULE | Freq: Every day | ORAL | Status: DC

## 2014-11-19 MED ORDER — SODIUM CHLORIDE 0.9 % IV SOLN
Freq: Once | INTRAVENOUS | Status: AC
  Administered 2014-11-19: 10:00:00 via INTRAVENOUS
  Filled 2014-11-19: qty 4

## 2014-11-19 MED ORDER — BORTEZOMIB CHEMO SQ INJECTION 3.5 MG (2.5MG/ML)
1.3000 mg/m2 | Freq: Once | INTRAMUSCULAR | Status: AC
  Administered 2014-11-19: 2.5 mg via SUBCUTANEOUS
  Filled 2014-11-19: qty 2.5

## 2014-11-19 MED ORDER — SODIUM CHLORIDE 0.9 % IJ SOLN
10.0000 mL | INTRAMUSCULAR | Status: DC | PRN
  Filled 2014-11-19: qty 10

## 2014-11-19 MED ORDER — HEPARIN SOD (PORK) LOCK FLUSH 100 UNIT/ML IV SOLN
500.0000 [IU] | Freq: Once | INTRAVENOUS | Status: DC | PRN
  Filled 2014-11-19: qty 5

## 2014-11-19 NOTE — Progress Notes (Signed)
OK to treat with elevated SCr per Dr. [name].  Infusion nurse notified.

## 2014-11-19 NOTE — Patient Instructions (Signed)
[facility] Discharge Instructions for Patients Receiving Chemotherapy  Today you received the following chemotherapy agents Cytoxan/Velcade  To help prevent nausea and vomiting after your treatment, we encourage you to take your nausea medication as prescribed.   If you develop nausea and vomiting that is not controlled by your nausea medication, call the clinic.   BELOW ARE SYMPTOMS THAT SHOULD BE REPORTED IMMEDIATELY:  *FEVER GREATER THAN 100.5 F  *CHILLS WITH OR WITHOUT FEVER  NAUSEA AND VOMITING THAT IS NOT CONTROLLED WITH YOUR NAUSEA MEDICATION  *UNUSUAL SHORTNESS OF BREATH  *UNUSUAL BRUISING OR BLEEDING  TENDERNESS IN MOUTH AND THROAT WITH OR WITHOUT PRESENCE OF ULCERS  *URINARY PROBLEMS  *BOWEL PROBLEMS  UNUSUAL RASH Items with * indicate a potential emergency and should be followed up as soon as possible.  Feel free to call the clinic you have any questions or concerns. The clinic phone number is [phone].  Please show the [place] at check-in to the Emergency Department and triage nurse.

## 2014-11-19 NOTE — Progress Notes (Signed)
Ok to treat with creatinine per Dr. [name].

## 2014-11-19 NOTE — Telephone Encounter (Signed)
per pof to add MD appt to 9/23-pt to get updated sch b4 leaving trmt room

## 2014-11-22 LAB — KAPPA/LAMBDA LIGHT CHAINS
Kappa free light chain: 3.81 mg/dL — ABNORMAL HIGH (ref 0.33–1.94)
Kappa:Lambda Ratio: 1.43 (ref 0.26–1.65)
Lambda Free Lght Chn: 2.67 mg/dL — ABNORMAL HIGH (ref 0.57–2.63)

## 2014-11-22 NOTE — Progress Notes (Signed)
.    Hematology oncology clinic note  Date of service 11/19/2014  Patient Care Team: [name], MD as PCP - General (Family Medicine) [name], MD as PCP - Hematology/Oncology (Hematology and Oncology) [name], MD as Consulting Physician (Nephrology)  CHIEF COMPLAINTS: followup for AL amyloidosis.  HISTORY OF PRESENTING ILLNESS: Please see my initial consultation for details on presentation.  INTERVAL HISTORY  Jose Garrett is here for his scheduled followup. He is understandably a little sad since he recently lost his elder sister. Notes some numbness in his feet which he somewhat had even before starting treatment (comes and goes - not persistent). No issues with balance or falls. Appears to be tolerating the dose reduction of cyclophophamide better with stable hgb. Decrease in urinary protein excretion.    MEDICAL HISTORY:  Past Medical History  Diagnosis Date  . Hypertension Dx 2012  . Hyperlipidemia Dx 2012  . History of blood transfusion 09/08/2014    "got hematoma after renal biopsy & HgB dropped"  . Anemia Dx 2016  . Family history of adverse reaction to anesthesia     "my daughter can't take certain anesthesia agents" (09/08/2014)  . Enlarged heart 2015  . Chronic diastolic CHF (congestive heart failure), NYHA class 1     /hospital problem list 09/08/2014  . Renal disorder     "there is a spot on my kidney" (09/08/2014)  . Chronic renal disease, stage III     /hospital problem list 09/08/2014    SURGICAL HISTORY: Past Surgical History  Procedure Laterality Date  . Renal biopsy, percutaneous Right 09/08/2014  . Tonsillectomy  ~ 1960  . Laparoscopic cholecystectomy  03/2011    SOCIAL HISTORY: Social History   Social History  . Marital Status: Divorced    Spouse Name: N/A  . Number of Children: N/A  . Years of Education: N/A   Occupational History  . Not on file.   Social History Main Topics  . Smoking status: Former Smoker -- 2.00  packs/day for 40 years    Types: Cigarettes    Quit date: 03/19/2011  . Smokeless tobacco: Never Used  . Alcohol Use: No  . Drug Use: No     Comment: 09/08/2014 "I have used marijuana till the 1990's". Notes that he quit 15 yrs ago  . Sexual Activity: Not Currently   Other Topics Concern  . 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 Lives with his daughter and her finance.  FAMILY HISTORY: Family History  Problem Relation Age of Onset  . Hypertension Mother   . Diabetes Mother   . Heart disease Mother   . Skin cancer Mother   . Hypertension Father   . Diabetes Father   . Diabetes Sister     ALLERGIES:  has No Known Allergies.  MEDICATIONS:  Current Outpatient Prescriptions  Medication Sig Dispense Refill  . acyclovir (ZOVIRAX) 400 MG tablet Take 1 tablet (400 mg total) by mouth daily. (Dose adjusted for renal function) 60 tablet 3  . amLODipine (NORVASC) 10 MG tablet Take 1 tablet (10 mg total) by mouth daily. (Patient taking differently: Take 10 mg by mouth daily with lunch. ) 30 tablet 3  . dexamethasone (DECADRON) 4 MG tablet Take 10 tablets (40 mg total) by mouth once a week. On Day 1,8,15 and 22. 30-64mins prior to cyclophosphamide 40 tablet 1  . furosemide (LASIX) 40 MG tablet Take 40 mg by mouth  2 (two) times daily.    . hydrALAZINE (APRESOLINE) 50 MG tablet Take 1 tablet (50 mg total) by mouth 3 (three) times daily. 120 tablet 2  . LORazepam (ATIVAN) 1 MG tablet Take 1 tablet (1 mg total) by mouth every 8 (eight) hours as needed for anxiety (or nausea). 30 tablet 0  . metoprolol (LOPRESSOR) 100 MG tablet TAKE 1 TABLET BY MOUTH 2 TIMES A DAY 60 tablet 3  . ondansetron (ZOFRAN) 8 MG tablet Take 1 tablet (8 mg total) by mouth 2 (two) times daily as needed (Nausea or vomiting). 30 tablet 1  . prochlorperazine (COMPAZINE) 10 MG tablet Take 1 tablet (10 mg total) by mouth every 6 (six) hours as needed  (Nausea or vomiting). 30 tablet 1  . vitamin E 100 UNIT capsule Take 100 Units by mouth every other day.     . b complex vitamins capsule Take 1 capsule by mouth daily. 60 capsule 1  . gabapentin (NEURONTIN) 100 MG capsule Take 2 capsules (200 mg total) by mouth at bedtime. 60 capsule 1   No current facility-administered medications for this visit.    REVIEW OF SYSTEMS:   Constitutional: Denies fevers, chills or abnormal night sweats Eyes: Denies blurriness of vision, double vision or watery eyes Ears, nose, mouth, throat, and face: Denies mucositis or sore throat Respiratory: Denies cough, dyspnea or wheezes Cardiovascular: Denies palpitation, chest discomfort or lower extremity swelling Gastrointestinal:  Denies nausea, heartburn or change in bowel habits Skin: Denies abnormal skin rashes Lymphatics: Denies new lymphadenopathy or easy bruising Neurological:Denies numbness, tingling or new weaknesses Behavioral/Psych: Mood is stable, no new changes  All other systems were reviewed with the patient and are negative.   PHYSICAL EXAMINATION: ECOG PERFORMANCE STATUS: 2 - Symptomatic, <50% confined to bed  Filed Vitals:   11/19/14 0844  BP: 161/61  Pulse: 64  Temp: 98.7 F (37.1 C)  Resp: 18   Filed Weights   11/19/14 0844  Weight: 185 lb 1.6 oz (83.961 kg)    GENERAL:alert, no distress , appears to be in good spirits and is smiling and quite verbal . SKIN: significant conjunctival pallor noted.  EYES: nl EOM, PERL OROPHARYNX:no exudate, no erythema and lips, buccal mucosa, and tongue normal  NECK: no JVD LYMPH:  no palpable lymphadenopathy in the cervical, axillary or inguinal LUNGS: clear to auscultation and percussion with normal breathing effort HEART: regular rate & rhythm and 2/6 SM aortic area ABDOMEN:abdomen soft, non-tender and normal bowel sounds Musculoskeletal: B/l 1+ pedal edemaredness or tenderness  PSYCH: alert & oriented x 3 with fluent speech NEURO: no  focal motor/sensory deficits  LABORATORY DATA:   I have reviewed the data as listed. CBC Latest Ref Rng 11/19/2014 11/12/2014 11/05/2014  WBC 4.0 - 10.3 10e3/uL 6.1 6.1 4.8  Hemoglobin 13.0 - 17.1 g/dL 9.2(L) 9.0(L) 7.8(L)  Hematocrit 38.4 - 49.9 % 27.5(L) 26.7(L) 23.0(L)  Platelets 140 - 400 10e3/uL 145 135(L) 132(L)       Recent Labs  02/25/14 0959 03/02/14 1227 06/30/14 1528 08/18/14 1537 09/15/14 1539  10/04/14 1223  11/05/14 0815 11/12/14 0842 11/19/14 0830  NA 144 141 141 142 143  < > 145  < > 142 141 140  K 4.9 5.0 4.2 3.8 4.3  < > 5.0  < > 3.6 3.4* 3.4*  CL 115* 113* 109 110 108  --  111*  --   --   --   --   CO2 18* 18* 18* 19 16*  < >  21  < > 19* 20* 18*  GLUCOSE 95 92 94 120* 113*  < > 111*  < > 126 109 128  BUN 27* 26* 30* 44* 36*  < > 38*  < > 41.1* 44.2* 36.6*  CREATININE 2.56* 2.38* 2.71* 2.97* 3.49*  < > 3.27*  < > 3.0* 2.9* 3.1*  CALCIUM 9.0 9.3 9.3 9.0 8.6  < > 9.6  < > 9.0 9.1 8.9  GFRNONAA 26* 28* 24*  --   --   --   --   --   --   --   --   GFRAA 30* 33* 28*  --   --   --   --   --   --   --   --   PROT 6.5 6.5 6.6  --   --   < >  --   < > 6.1* 6.1* 6.2*  ALBUMIN 3.7 3.8 4.0 4.1 3.7  < >  --   < > 3.2* 3.2* 3.2*  AST $Re'17 17 18  'kzP$ --   --   < >  --   < > $R'13 12 15  'Oz$ ALT $'10 11 11  'Z$ --   --   < >  --   < > $R'10 9 14  'AC$ ALKPHOS 62 66 71  --   --   < >  --   < > 62 62 67  BILITOT 0.3 0.3 0.3  --   --   < >  --   < > 0.41 0.43 0.43  < > = values in this interval not displayed. . Lab Results  Component Value Date   TOTALPROTELP 5.7* 11/12/2014   ALBUMINELP 3.5* 11/12/2014   A1GS 0.3 11/12/2014   A2GS 0.9 11/12/2014   BETS 0.4 11/12/2014   BETA2SER 0.3 11/12/2014   GAMS 0.4* 11/12/2014   SPEI * 11/12/2014   (this displays SPEP labs)  Lab Results  Component Value Date   KPAFRELGTCHN 3.81* 11/19/2014   LAMBDASER 2.67* 11/19/2014   KAPLAMBRATIO 1.43 11/19/2014   (kappa/lambda light chains)  BONE MARROW BIOPSY RESULTS 09/28/2014:   RADIOGRAPHIC STUDIES: I  have personally reviewed the radiological images as listed and agreed with the findings in the report. No results found. Echocardiogram 10/07/2014  Study Conclusions  - Left ventricle: The cavity size was normal. Wall thickness was increased in a pattern of moderate LVH. There was mild focal basal hypertrophy of the septum. Systolic function was normal. The estimated ejection fraction was in the range of 55% to 60%. Wall motion was normal; there were no regional wall motion abnormalities. Features are consistent with a pseudonormal left ventricular filling pattern, with concomitant abnormal relaxation and increased filling pressure (grade 2 diastolic dysfunction). - Aortic valve: There was very mild stenosis. There was mild regurgitation. Valve area (VTI): 2.67 cm^2. Valve area (Vmax): 2.49 cm^2. Valve area (Vmean): 2.3 cm^2. - Mitral valve: There was mild regurgitation.  Impressions:  - Normal LV function; grade 2 diastolic dysfunction; mild to moderate LVH with proximal septal thickening; calcified aortic valve with very mild AS (mean gradient 10 mmHg) and mild AI; mild MR; trace TR. Global longitudinal strain - 17%.      ASSESSMENT & PLAN:    Mr Jose Garrett is very pleasant 64 yo caucasian male with   1) AL Amyloidosis noted on recent renal biopsy with nephrotic range proteinuria on urinalysis and some lambda free light chains on serum and urinary IFE . SPEP with small amount of  M-spike 0.3  Echo showed no systolic dysfunction but grade 2 diastolic dysfunction. [facility] cardiac staging 1 as per troponin and BNP criteria. Bone marrow with no overt evidence of multiple myeloma. PET/CT scan showed no evidence of bony lesions or lymphadenopathy . Urine showed about 15 g of protein in 24 hours   Now after 1 month of treatment the M-spike is down to 0.1gm/dl and urinary total protein is down to about 7.6g/24hours 2) Symptomatic anemia - likely  multifactorial -from AL amyloidosis, anemia from CKD, acute blood loss anemia due to large perinephric hematoma related to recent renal bx on 09/08/2014. Hematoma noted to be stable on rpt US renal on 09/17/2014. 3) CKD -related to hypertension and, Amyloidosis. Creatinine is 3.1 today and is improved from his previous high of 3.5. Decreasing proteinuria 4) Feet tingling and numbness ? Related to AL Amyloidosis vs Velcade vs other etiology   Plan Continue reduced dose of cyclophosphamide to 300 mg weekly and Velcade and dexamethasone. -If he is getting more anemic again post transfusion even with dose reduction might need to consider Aranesp to treat anemia from his CKD. -Patient will get CBC, CMP weekly .  -will check B1, B6, B12 and copper to workup other potential etiologies of neuropathy. -start on low renally dosed neurontin $RemoveBeforeD'200mg'EXuCeGVcZiKTiK$  po HS for neuropathy. -if persistent or worsening neuropathy will need to potentially dose reduce velcade.  -Continue close f/u with PCP and nephrology. -Will need his blood pressure well controlled to allow for Aranesp use.   Return to care with Dr. [name] in 2 weeks. Continue weekly labs    I appreciate the privilege of being a part of the medical care of this fantastic gentleman.  All questions were answered. The patient knows to call the clinic with any problems, questions or concerns. I spent 20 minutes counseling the patient face to face. The total time spent in the appointment was 25 minutes and more than 50% was on counseling.   Sullivan Lone MD [facility] Phys[facility]  (Office):       [phone] (Work cell):  [phone] (Fax):           [phone]

## 2014-11-24 LAB — UPEP/TP, 24-HR URINE
ALPHA-1-GLOBULIN, U: 8.2 %
ALPHA-2-GLOBULIN, U: 5.3 %
Albumin: 74.9 %
Beta Globulin, U: 8.4 %
COLLECTION INTERVAL: 24 h
GAMMA GLOBULIN, U: 3.2 %
MONOCLONAL BAND 1: 0.3 %
TOTAL PROTEIN, URINE: 295 mg/dL
TOTAL VOLUME, URINE: 2600 mL
Total Protein, Urine/Day: 7670 mg/d — ABNORMAL HIGH (ref 50–100)

## 2014-11-24 LAB — UIFE/LIGHT CHAINS/TP QN, 24-HR UR
ALBUMIN, U: DETECTED
ALPHA 1 UR: DETECTED — AB
ALPHA 2 UR: DETECTED — AB
Beta, Urine: DETECTED — AB
Gamma Globulin, Urine: DETECTED — AB
TIME-UPE24: 24 h
Total Protein, Urine: 295 mg/dL — ABNORMAL HIGH (ref 5–25)
Volume, Urine: 2600 mL

## 2014-11-24 LAB — 24 HR URINE,KAPPA/LAMBDA LIGHT CHAINS
24H Urine Volume: 2600 mL/24 h
Measured Kappa Chain: 3.72 mg/dL — ABNORMAL HIGH (ref <2.00)
Measured Lambda Chain: 3.46 mg/dL — ABNORMAL HIGH (ref <2.00)
Total Kappa Chain: 96.72 mg/24 h
Total Lambda Chain: 89.96 mg/24 h

## 2014-11-26 ENCOUNTER — Other Ambulatory Visit (HOSPITAL_BASED_OUTPATIENT_CLINIC_OR_DEPARTMENT_OTHER): Payer: Self-pay

## 2014-11-26 ENCOUNTER — Ambulatory Visit (HOSPITAL_BASED_OUTPATIENT_CLINIC_OR_DEPARTMENT_OTHER): Payer: Self-pay

## 2014-11-26 VITALS — BP 164/58 | HR 64 | Temp 97.8°F | Resp 18

## 2014-11-26 DIAGNOSIS — E858 Other amyloidosis: Secondary | ICD-10-CM

## 2014-11-26 DIAGNOSIS — E8581 Light chain (AL) amyloidosis: Secondary | ICD-10-CM

## 2014-11-26 DIAGNOSIS — G629 Polyneuropathy, unspecified: Secondary | ICD-10-CM

## 2014-11-26 DIAGNOSIS — Z5111 Encounter for antineoplastic chemotherapy: Secondary | ICD-10-CM

## 2014-11-26 DIAGNOSIS — Z5112 Encounter for antineoplastic immunotherapy: Secondary | ICD-10-CM

## 2014-11-26 LAB — CBC WITH DIFFERENTIAL/PLATELET
BASO%: 0.4 % (ref 0.0–2.0)
Basophils Absolute: 0 10*3/uL (ref 0.0–0.1)
EOS ABS: 0.3 10*3/uL (ref 0.0–0.5)
EOS%: 7 % (ref 0.0–7.0)
HCT: 26.2 % — ABNORMAL LOW (ref 38.4–49.9)
HEMOGLOBIN: 9 g/dL — AB (ref 13.0–17.1)
LYMPH%: 11.1 % — ABNORMAL LOW (ref 14.0–49.0)
MCH: 30.7 pg (ref 27.2–33.4)
MCHC: 34.5 g/dL (ref 32.0–36.0)
MCV: 88.9 fL (ref 79.3–98.0)
MONO#: 0.5 10*3/uL (ref 0.1–0.9)
MONO%: 9.6 % (ref 0.0–14.0)
NEUT%: 71.9 % (ref 39.0–75.0)
NEUTROS ABS: 3.6 10*3/uL (ref 1.5–6.5)
PLATELETS: 132 10*3/uL — AB (ref 140–400)
RBC: 2.95 10*6/uL — AB (ref 4.20–5.82)
RDW: 16.4 % — AB (ref 11.0–14.6)
WBC: 4.9 10*3/uL (ref 4.0–10.3)
lymph#: 0.5 10*3/uL — ABNORMAL LOW (ref 0.9–3.3)

## 2014-11-26 LAB — COMPREHENSIVE METABOLIC PANEL (CC13)
ALBUMIN: 3.4 g/dL — AB (ref 3.5–5.0)
ALK PHOS: 65 U/L (ref 40–150)
ALT: 16 U/L (ref 0–55)
AST: 18 U/L (ref 5–34)
Anion Gap: 11 mEq/L (ref 3–11)
BILIRUBIN TOTAL: 0.38 mg/dL (ref 0.20–1.20)
BUN: 40.4 mg/dL — ABNORMAL HIGH (ref 7.0–26.0)
CO2: 20 meq/L — AB (ref 22–29)
CREATININE: 2.9 mg/dL — AB (ref 0.7–1.3)
Calcium: 9 mg/dL (ref 8.4–10.4)
Chloride: 113 mEq/L — ABNORMAL HIGH (ref 98–109)
EGFR: 22 mL/min/{1.73_m2} — AB (ref >90)
GLUCOSE: 130 mg/dL (ref 70–140)
Potassium: 3.5 mEq/L (ref 3.5–5.1)
SODIUM: 143 meq/L (ref 136–145)
TOTAL PROTEIN: 6.3 g/dL — AB (ref 6.4–8.3)

## 2014-11-26 LAB — TECHNOLOGIST REVIEW

## 2014-11-26 MED ORDER — SODIUM CHLORIDE 0.9 % IV SOLN
300.0000 mg | Freq: Once | INTRAVENOUS | Status: AC
  Administered 2014-11-26: 300 mg via INTRAVENOUS
  Filled 2014-11-26: qty 15

## 2014-11-26 MED ORDER — BORTEZOMIB CHEMO SQ INJECTION 3.5 MG (2.5MG/ML)
1.3000 mg/m2 | Freq: Once | INTRAMUSCULAR | Status: AC
  Administered 2014-11-26: 2.5 mg via SUBCUTANEOUS
  Filled 2014-11-26: qty 2.5

## 2014-11-26 MED ORDER — SODIUM CHLORIDE 0.9 % IV SOLN
Freq: Once | INTRAVENOUS | Status: AC
  Administered 2014-11-26: 10:00:00 via INTRAVENOUS

## 2014-11-26 MED ORDER — SODIUM CHLORIDE 0.9 % IV SOLN
Freq: Once | INTRAVENOUS | Status: AC
  Administered 2014-11-26: 10:00:00 via INTRAVENOUS
  Filled 2014-11-26: qty 4

## 2014-11-26 NOTE — Patient Instructions (Signed)
[facility] Discharge Instructions for Patients Receiving Chemotherapy  Today you received the following chemotherapy agents: Velcade and Cytoxan.  Continue your Decadron. Take 10 tablets once weekly. Take Acyclovir once daily for shingles prevention.  To help prevent nausea and vomiting after your treatment, we encourage you to take your nausea medication: Compazine. Take one every 6 hours as needed.   If you develop nausea and vomiting that is not controlled by your nausea medication, call the clinic.   BELOW ARE SYMPTOMS THAT SHOULD BE REPORTED IMMEDIATELY:  *FEVER GREATER THAN 100.5 F  *CHILLS WITH OR WITHOUT FEVER  NAUSEA AND VOMITING THAT IS NOT CONTROLLED WITH YOUR NAUSEA MEDICATION  *UNUSUAL SHORTNESS OF BREATH  *UNUSUAL BRUISING OR BLEEDING  TENDERNESS IN MOUTH AND THROAT WITH OR WITHOUT PRESENCE OF ULCERS  *URINARY PROBLEMS  *BOWEL PROBLEMS  UNUSUAL RASH Items with * indicate a potential emergency and should be followed up as soon as possible.  Feel free to call the clinic should you have any questions or concerns. The clinic phone number is [phone].  Please show the [facility] at check-in to the Emergency Department and triage nurse.

## 2014-12-02 LAB — VITAMIN B1: VITAMIN B1 (THIAMINE): 16 nmol/L (ref 8–30)

## 2014-12-02 LAB — CERULOPLASMIN: Ceruloplasmin: 20 mg/dL (ref 18–36)

## 2014-12-02 LAB — COPPER, SERUM: Copper: 80 ug/dL (ref 70–175)

## 2014-12-02 LAB — VITAMIN B12: VITAMIN B 12: 235 pg/mL (ref 211–911)

## 2014-12-02 LAB — VITAMIN B6: VITAMIN B6: 13.4 ng/mL (ref 2.1–21.7)

## 2014-12-03 ENCOUNTER — Telehealth: Payer: Self-pay | Admitting: Hematology

## 2014-12-03 ENCOUNTER — Ambulatory Visit (HOSPITAL_BASED_OUTPATIENT_CLINIC_OR_DEPARTMENT_OTHER): Payer: Self-pay | Admitting: Hematology

## 2014-12-03 ENCOUNTER — Ambulatory Visit (HOSPITAL_BASED_OUTPATIENT_CLINIC_OR_DEPARTMENT_OTHER): Payer: Self-pay

## 2014-12-03 ENCOUNTER — Other Ambulatory Visit (HOSPITAL_BASED_OUTPATIENT_CLINIC_OR_DEPARTMENT_OTHER): Payer: Self-pay

## 2014-12-03 VITALS — BP 150/60 | HR 63 | Temp 98.2°F | Resp 18 | Ht 68.5 in | Wt 189.3 lb

## 2014-12-03 DIAGNOSIS — E8581 Light chain (AL) amyloidosis: Secondary | ICD-10-CM

## 2014-12-03 DIAGNOSIS — E858 Other amyloidosis: Secondary | ICD-10-CM

## 2014-12-03 DIAGNOSIS — N189 Chronic kidney disease, unspecified: Secondary | ICD-10-CM

## 2014-12-03 DIAGNOSIS — Z5112 Encounter for antineoplastic immunotherapy: Secondary | ICD-10-CM

## 2014-12-03 DIAGNOSIS — D638 Anemia in other chronic diseases classified elsewhere: Secondary | ICD-10-CM

## 2014-12-03 DIAGNOSIS — D631 Anemia in chronic kidney disease: Secondary | ICD-10-CM

## 2014-12-03 DIAGNOSIS — E538 Deficiency of other specified B group vitamins: Secondary | ICD-10-CM

## 2014-12-03 DIAGNOSIS — Z5111 Encounter for antineoplastic chemotherapy: Secondary | ICD-10-CM

## 2014-12-03 LAB — CBC WITH DIFFERENTIAL/PLATELET
BASO%: 0.4 % (ref 0.0–2.0)
BASOS ABS: 0 10*3/uL (ref 0.0–0.1)
EOS ABS: 0.3 10*3/uL (ref 0.0–0.5)
EOS%: 4.5 % (ref 0.0–7.0)
HCT: 25 % — ABNORMAL LOW (ref 38.4–49.9)
HEMOGLOBIN: 8.5 g/dL — AB (ref 13.0–17.1)
LYMPH#: 1 10*3/uL (ref 0.9–3.3)
LYMPH%: 17.2 % (ref 14.0–49.0)
MCH: 30.1 pg (ref 27.2–33.4)
MCHC: 34 g/dL (ref 32.0–36.0)
MCV: 88.7 fL (ref 79.3–98.0)
MONO#: 0.5 10*3/uL (ref 0.1–0.9)
MONO%: 8.8 % (ref 0.0–14.0)
NEUT#: 3.9 10*3/uL (ref 1.5–6.5)
NEUT%: 69.1 % (ref 39.0–75.0)
NRBC: 0 % (ref 0–0)
PLATELETS: 126 10*3/uL — AB (ref 140–400)
RBC: 2.82 10*6/uL — ABNORMAL LOW (ref 4.20–5.82)
RDW: 15.8 % — AB (ref 11.0–14.6)
WBC: 5.6 10*3/uL (ref 4.0–10.3)

## 2014-12-03 LAB — COMPREHENSIVE METABOLIC PANEL (CC13)
ALBUMIN: 3.3 g/dL — AB (ref 3.5–5.0)
ALK PHOS: 102 U/L (ref 40–150)
ALT: 39 U/L (ref 0–55)
ANION GAP: 10 meq/L (ref 3–11)
AST: 31 U/L (ref 5–34)
BILIRUBIN TOTAL: 0.38 mg/dL (ref 0.20–1.20)
BUN: 39.1 mg/dL — ABNORMAL HIGH (ref 7.0–26.0)
CALCIUM: 8.6 mg/dL (ref 8.4–10.4)
CO2: 19 mEq/L — ABNORMAL LOW (ref 22–29)
Chloride: 111 mEq/L — ABNORMAL HIGH (ref 98–109)
Creatinine: 2.9 mg/dL — ABNORMAL HIGH (ref 0.7–1.3)
EGFR: 22 mL/min/{1.73_m2} — AB (ref >90)
Glucose: 131 mg/dl (ref 70–140)
POTASSIUM: 3.9 meq/L (ref 3.5–5.1)
SODIUM: 140 meq/L (ref 136–145)
TOTAL PROTEIN: 6 g/dL — AB (ref 6.4–8.3)

## 2014-12-03 MED ORDER — DEXAMETHASONE 4 MG PO TABS: 20.0000 mg | ORAL_TABLET | ORAL | Status: DC

## 2014-12-03 MED ORDER — DARBEPOETIN ALFA 100 MCG/0.5ML IJ SOSY
40.0000 ug | PREFILLED_SYRINGE | INTRAMUSCULAR | Status: DC
  Filled 2014-12-03: qty 0.5

## 2014-12-03 MED ORDER — ONDANSETRON HCL 40 MG/20ML IJ SOLN
Freq: Once | INTRAMUSCULAR | Status: AC
Start: 2014-12-03 — End: 2014-12-03
  Administered 2014-12-03: 10:00:00 via INTRAVENOUS
  Filled 2014-12-03: qty 4

## 2014-12-03 MED ORDER — GABAPENTIN 250 MG/5ML PO SOLN: 200.0000 mg | Freq: Every day | ORAL | Status: DC

## 2014-12-03 MED ORDER — CYANOCOBALAMIN 1000 MCG/ML IJ SOLN
INTRAMUSCULAR | Status: AC
  Filled 2014-12-03: qty 1

## 2014-12-03 MED ORDER — CYANOCOBALAMIN 1000 MCG/ML IJ SOLN
1000.0000 ug | INTRAMUSCULAR | Status: DC
  Administered 2014-12-03: 1000 ug via SUBCUTANEOUS

## 2014-12-03 MED ORDER — SODIUM CHLORIDE 0.9 % IV SOLN
Freq: Once | INTRAVENOUS | Status: AC
  Administered 2014-12-03: 10:00:00 via INTRAVENOUS

## 2014-12-03 MED ORDER — BORTEZOMIB CHEMO SQ INJECTION 3.5 MG (2.5MG/ML)
1.3000 mg/m2 | Freq: Once | INTRAMUSCULAR | Status: AC
  Administered 2014-12-03: 2.5 mg via SUBCUTANEOUS
  Filled 2014-12-03: qty 2.5

## 2014-12-03 MED ORDER — SODIUM CHLORIDE 0.9 % IV SOLN
300.0000 mg | Freq: Once | INTRAVENOUS | Status: AC
  Administered 2014-12-03: 300 mg via INTRAVENOUS
  Filled 2014-12-03: qty 15

## 2014-12-03 MED ORDER — DARBEPOETIN ALFA 40 MCG/0.4ML IJ SOSY
40.0000 ug | PREFILLED_SYRINGE | Freq: Once | INTRAMUSCULAR | Status: AC
  Administered 2014-12-03: 40 ug via SUBCUTANEOUS
  Filled 2014-12-03: qty 0.4

## 2014-12-03 MED ORDER — FUROSEMIDE 40 MG PO TABS: 40.0000 mg | ORAL_TABLET | Freq: Two times a day (BID) | ORAL | Status: DC

## 2014-12-03 NOTE — Telephone Encounter (Signed)
Gave and printd appt sched and avs for pt for Sept and OCT

## 2014-12-03 NOTE — Patient Instructions (Signed)
[facility] Discharge Instructions for Patients Receiving Chemotherapy  Today you received the following chemotherapy agents Cytoxan/Velcade To help prevent nausea and vomiting after your treatment, we encourage you to take your nausea medication as prescribed. If you develop nausea and vomiting that is not controlled by your nausea medication, call the clinic.   BELOW ARE SYMPTOMS THAT SHOULD BE REPORTED IMMEDIATELY:  *FEVER GREATER THAN 100.5 F  *CHILLS WITH OR WITHOUT FEVER  NAUSEA AND VOMITING THAT IS NOT CONTROLLED WITH YOUR NAUSEA MEDICATION  *UNUSUAL SHORTNESS OF BREATH  *UNUSUAL BRUISING OR BLEEDING  TENDERNESS IN MOUTH AND THROAT WITH OR WITHOUT PRESENCE OF ULCERS  *URINARY PROBLEMS  *BOWEL PROBLEMS  UNUSUAL RASH Items with * indicate a potential emergency and should be followed up as soon as possible.  Feel free to call the clinic you have any questions or concerns. The clinic phone number is [phone].  Please show the [facility] at check-in to the Emergency Department and triage nurse.   Cyanocobalamin, Vitamin B12 injection What is this medicine? CYANOCOBALAMIN (sye an oh koe BAL a min) is a man made form of vitamin B12. Vitamin B12 is used in the growth of healthy blood cells, nerve cells, and proteins in the body. It also helps with the metabolism of fats and carbohydrates. This medicine is used to treat people who can not absorb vitamin B12. This medicine may be used for other purposes; ask your health care provider or pharmacist if you have questions. COMMON BRAND NAME(S): Cyomin, LA-12, Nutri-Twelve, Primabalt What should I tell my health care provider before I take this medicine? They need to know if you have any of these conditions: -kidney disease -Leber's disease -megaloblastic anemia -an unusual or allergic reaction to cyanocobalamin, cobalt, other medicines, foods, dyes, or preservatives -pregnant or trying to get  pregnant -breast-feeding How should I use this medicine? This medicine is injected into a muscle or deeply under the skin. It is usually given by a health care professional in a clinic or doctor's office. However, your doctor may teach you how to inject yourself. Follow all instructions. Talk to your pediatrician regarding the use of this medicine in children. Special care may be needed. Overdosage: If you think you have taken too much of this medicine contact a poison control center or emergency room at once. NOTE: This medicine is only for you. Do not share this medicine with others. What if I miss a dose? If you are given your dose at a clinic or doctor's office, call to reschedule your appointment. If you give your own injections and you miss a dose, take it as soon as you can. If it is almost time for your next dose, take only that dose. Do not take double or extra doses. What may interact with this medicine? -colchicine -heavy alcohol intake This list may not describe all possible interactions. Give your health care provider a list of all the medicines, herbs, non-prescription drugs, or dietary supplements you use. Also tell them if you smoke, drink alcohol, or use illegal drugs. Some items may interact with your medicine. What should I watch for while using this medicine? Visit your doctor or health care professional regularly. You may need blood work done while you are taking this medicine. You may need to follow a special diet. Talk to your doctor. Limit your alcohol intake and avoid smoking to get the best benefit. What side effects may I notice from receiving this medicine? Side effects that you  should report to your doctor or health care professional as soon as possible: -allergic reactions like skin rash, itching or hives, swelling of the face, lips, or tongue -blue tint to skin -chest tightness, pain -difficulty breathing, wheezing -dizziness -red, swollen painful area on the  leg Side effects that usually do not require medical attention (report to your doctor or health care professional if they continue or are bothersome): -diarrhea -headache This list may not describe all possible side effects. Call your doctor for medical advice about side effects. You may report side effects to FDA at 1-800-FDA-1088. Where should I keep my medicine? Keep out of the reach of children. Store at room temperature between 15 and 30 degrees C (59 and 85 degrees F). Protect from light. Throw away any unused medicine after the expiration date. NOTE: This sheet is a summary. It may not cover all possible information. If you have questions about this medicine, talk to your doctor, pharmacist, or health care provider.  2015, Elsevier/Gold Standard. (2007-06-09 22:10:20)  Darbepoetin Alfa injection (Aranesp) What is this medicine? DARBEPOETIN ALFA (dar be POE e tin AL fa) helps your body make more red blood cells. It is used to treat anemia caused by chronic kidney failure and chemotherapy. This medicine may be used for other purposes; ask your health care provider or pharmacist if you have questions. COMMON BRAND NAME(S): Aranesp What should I tell my health care provider before I take this medicine? They need to know if you have any of these conditions: -blood clotting disorders or history of blood clots -cancer patient not on chemotherapy -cystic fibrosis -heart disease, such as angina, heart failure, or a history of a heart attack -hemoglobin level of 12 g/dL or greater -high blood pressure -low levels of folate, iron, or vitamin B12 -seizures -an unusual or allergic reaction to darbepoetin, erythropoietin, albumin, hamster proteins, latex, other medicines, foods, dyes, or preservatives -pregnant or trying to get pregnant -breast-feeding How should I use this medicine? This medicine is for injection into a vein or under the skin. It is usually given by a health care  professional in a hospital or clinic setting. If you get this medicine at home, you will be taught how to prepare and give this medicine. Do not shake the solution before you withdraw a dose. Use exactly as directed. Take your medicine at regular intervals. Do not take your medicine more often than directed. It is important that you put your used needles and syringes in a special sharps container. Do not put them in a trash can. If you do not have a sharps container, call your pharmacist or healthcare provider to get one. Talk to your pediatrician regarding the use of this medicine in children. While this medicine may be used in children as young as 1 year for selected conditions, precautions do apply. Overdosage: If you think you have taken too much of this medicine contact a poison control center or emergency room at once. NOTE: This medicine is only for you. Do not share this medicine with others. What if I miss a dose? If you miss a dose, take it as soon as you can. If it is almost time for your next dose, take only that dose. Do not take double or extra doses. What may interact with this medicine? Do not take this medicine with any of the following medications: -epoetin alfa This list may not describe all possible interactions. Give your health care provider a list of all the medicines, herbs, non-prescription  drugs, or dietary supplements you use. Also tell them if you smoke, drink alcohol, or use illegal drugs. Some items may interact with your medicine. What should I watch for while using this medicine? Visit your prescriber or health care professional for regular checks on your progress and for the needed blood tests and blood pressure measurements. It is especially important for the doctor to make sure your hemoglobin level is in the desired range, to limit the risk of potential side effects and to give you the best benefit. Keep all appointments for any recommended tests. Check your blood  pressure as directed. Ask your doctor what your blood pressure should be and when you should contact him or her. As your body makes more red blood cells, you may need to take iron, folic acid, or vitamin B supplements. Ask your doctor or health care provider which products are right for you. If you have kidney disease continue dietary restrictions, even though this medication can make you feel better. Talk with your doctor or health care professional about the foods you eat and the vitamins that you take. What side effects may I notice from receiving this medicine? Side effects that you should report to your doctor or health care professional as soon as possible: -allergic reactions like skin rash, itching or hives, swelling of the face, lips, or tongue -breathing problems -changes in vision -chest pain -confusion, trouble speaking or understanding -feeling faint or lightheaded, falls -high blood pressure -muscle aches or pains -pain, swelling, warmth in the leg -rapid weight gain -severe headaches -sudden numbness or weakness of the face, arm or leg -trouble walking, dizziness, loss of balance or coordination -seizures (convulsions) -swelling of the ankles, feet, hands -unusually weak or tired Side effects that usually do not require medical attention (report to your doctor or health care professional if they continue or are bothersome): -diarrhea -fever, chills (flu-like symptoms) -headaches -nausea, vomiting -redness, stinging, or swelling at site where injected This list may not describe all possible side effects. Call your doctor for medical advice about side effects. You may report side effects to FDA at 1-800-FDA-1088. Where should I keep my medicine? Keep out of the reach of children. Store in a refrigerator between 2 and 8 degrees C (36 and 46 degrees F). Do not freeze. Do not shake. Throw away any unused portion if using a single-dose vial. Throw away any unused medicine after  the expiration date. NOTE: This sheet is a summary. It may not cover all possible information. If you have questions about this medicine, talk to your doctor, pharmacist, or health care provider.  2015, Elsevier/Gold Standard. (2008-02-10 10:23:57)

## 2014-12-09 ENCOUNTER — Encounter: Payer: Self-pay | Admitting: Hematology

## 2014-12-09 MED ORDER — VITAMIN B-12 1000 MCG PO TABS: 1000.0000 ug | ORAL_TABLET | Freq: Every day | ORAL | Status: DC

## 2014-12-09 NOTE — Progress Notes (Signed)
.    Hematology oncology clinic note  Date of service 12/03/2014  Patient Care Team: [name], MD as PCP - General (Family Medicine) [name], MD as PCP - Hematology/Oncology (Hematology and Oncology) [name], MD as Consulting Physician (Nephrology)  CHIEF COMPLAINTS: followup for AL amyloidosis.  HISTORY OF PRESENTING ILLNESS: Please see my initial consultation for details on presentation.  INTERVAL HISTORY  [name] is here for his scheduled followup. He notes that his feet feel about the same with no worsening of his tingling and numbness. Did not take the gabapentin since he has difficulty swallowing capsules. He is okay with trying the liquid form of gabapentin and this was prescribed to him. Notes some increased lower extremity swelling. Recommended he increase his Lasix dose and be reduced his dexamethasone to 20 mg weekly. His hemoglobin was drifting down lower. We talked about the pros and cons of adding darbepoetin and he is agreeable to this. He was started on this today after his informed consent was obtained. He notes that he did not start taking his B12 and given that his B12 levels were 235. He notes he has difficulties taking the pill, and therefore this was ordered to be given subcutaneously. He is glad to be off the gemfibrozil. No other acute new concerns. Energy level to been good.  MEDICAL HISTORY:  Past Medical History  Diagnosis Date  . Hypertension Dx 2012  . Hyperlipidemia Dx 2012  . History of blood transfusion 09/08/2014    "got hematoma after renal biopsy & HgB dropped"  . Anemia Dx 2016  . Family history of adverse reaction to anesthesia     "my daughter can't take certain anesthesia agents" (09/08/2014)  . Enlarged heart 2015  . Chronic diastolic CHF (congestive heart failure), NYHA class 1     /hospital problem list 09/08/2014  . Renal disorder     "there is a spot on my kidney" (09/08/2014)  . Chronic renal disease, stage  III     /hospital problem list 09/08/2014    SURGICAL HISTORY: Past Surgical History  Procedure Laterality Date  . Renal biopsy, percutaneous Right 09/08/2014  . Tonsillectomy  ~ 1960  . Laparoscopic cholecystectomy  03/2011    SOCIAL HISTORY: Social History   Social History  . Marital Status: Divorced    Spouse Name: N/A  . Number of Children: N/A  . Years of Education: N/A   Occupational History  . Not on file.   Social History Main Topics  . Smoking status: Former Smoker -- 2.00 packs/day for 40 years    Types: Cigarettes    Quit date: 03/19/2011  . Smokeless tobacco: Never Used  . Alcohol Use: No  . Drug Use: No     Comment: 09/08/2014 "I have used marijuana till the 1990's". Notes that he quit 15 yrs ago  . Sexual Activity: Not Currently   Other Topics Concern  . 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 Lives with his daughter and her finance.  FAMILY HISTORY: Family History  Problem Relation Age of Onset  . Hypertension Mother   . Diabetes Mother   . Heart disease Mother   . Skin cancer Mother   . Hypertension Father   . Diabetes Father   . Diabetes Sister     ALLERGIES:  has No Known Allergies.  MEDICATIONS:  Current Outpatient Prescriptions  Medication Sig Dispense Refill  . acyclovir (ZOVIRAX)  400 MG tablet Take 1 tablet (400 mg total) by mouth daily. (Dose adjusted for renal function) 60 tablet 3  . amLODipine (NORVASC) 10 MG tablet Take 1 tablet (10 mg total) by mouth daily. (Patient taking differently: Take 10 mg by mouth daily with lunch. ) 30 tablet 3  . b complex vitamins capsule Take 1 capsule by mouth daily. 60 capsule 1  . dexamethasone (DECADRON) 4 MG tablet Take 5 tablets (20 mg total) by mouth once a week. On Day 1,8,15 and 22. 30-65mins prior to cyclophosphamide 40 tablet 1  . furosemide (LASIX) 40 MG tablet Take 1 tablet (40 mg total) by mouth 2 (two) times daily. 90  tablet 1  . gabapentin (NEURONTIN) 250 MG/5ML solution Take 4 mLs (200 mg total) by mouth at bedtime. 120 mL 1  . hydrALAZINE (APRESOLINE) 50 MG tablet Take 1 tablet (50 mg total) by mouth 3 (three) times daily. 120 tablet 2  . LORazepam (ATIVAN) 1 MG tablet Take 1 tablet (1 mg total) by mouth every 8 (eight) hours as needed for anxiety (or nausea). 30 tablet 0  . metoprolol (LOPRESSOR) 100 MG tablet TAKE 1 TABLET BY MOUTH 2 TIMES A DAY 60 tablet 3  . ondansetron (ZOFRAN) 8 MG tablet Take 1 tablet (8 mg total) by mouth 2 (two) times daily as needed (Nausea or vomiting). 30 tablet 1  . prochlorperazine (COMPAZINE) 10 MG tablet Take 1 tablet (10 mg total) by mouth every 6 (six) hours as needed (Nausea or vomiting). 30 tablet 1  . vitamin E 100 UNIT capsule Take 100 Units by mouth every other day.      Current Facility-Administered Medications  Medication Dose Route Frequency Lycan Davee Last Rate Last Dose  . cyanocobalamin ((VITAMIN B-12)) injection 1,000 mcg  1,000 mcg Subcutaneous Q30 days [name], MD   1,000 mcg at 12/03/14 1006    REVIEW OF SYSTEMS:   Constitutional: Denies fevers, chills or abnormal night sweats Eyes: Denies blurriness of vision, double vision or watery eyes Ears, nose, mouth, throat, and face: Denies mucositis or sore throat Respiratory: Denies cough, dyspnea or wheezes Cardiovascular: Denies palpitation, chest discomfort or lower extremity swelling Gastrointestinal:  Denies nausea, heartburn or change in bowel habits Skin: Denies abnormal skin rashes Lymphatics: Denies new lymphadenopathy or easy bruising Neurological:Denies numbness, tingling or new weaknesses Behavioral/Psych: Mood is stable, no new changes  All other systems were reviewed with the patient and are negative.   PHYSICAL EXAMINATION: ECOG PERFORMANCE STATUS: 2 - Symptomatic, <50% confined to bed  Filed Vitals:   12/03/14 0838  BP: 150/60  Pulse: 63  Temp: 98.2 F (36.8 C)  Resp: 18     Filed Weights   12/03/14 0838  Weight: 189 lb 4.8 oz (85.866 kg)    GENERAL:alert, no distress , appears to be in good spirits and is smiling and quite verbal . SKIN: significant conjunctival pallor noted.  EYES: nl EOM, PERL OROPHARYNX:no exudate, no erythema and lips, buccal mucosa, and tongue normal  NECK: no JVD LYMPH:  no palpable lymphadenopathy in the cervical, axillary or inguinal LUNGS: clear to auscultation and percussion with normal breathing effort HEART: regular rate & rhythm and 2/6 SM aortic area ABDOMEN:abdomen soft, non-tender and normal bowel sounds Musculoskeletal: B/l 1+ pedal edemaredness or tenderness  PSYCH: alert & oriented x 3 with fluent speech NEURO: no focal motor/sensory deficits  LABORATORY DATA:   I have reviewed the data as listed. CBC Latest Ref Rng 12/03/2014 11/26/2014 11/19/2014  WBC 4.0 -  10.3 10e3/uL 5.6 4.9 6.1  Hemoglobin 13.0 - 17.1 g/dL 8.5(L) 9.0(L) 9.2(L)  Hematocrit 38.4 - 49.9 % 25.0(L) 26.2(L) 27.5(L)  Platelets 140 - 400 10e3/uL 126(L) 132(L) 145       Recent Labs  02/25/14 0959 03/02/14 1227 06/30/14 1528 08/18/14 1537 09/15/14 1539  10/04/14 1223  11/19/14 0830 11/26/14 0818 12/03/14 0811  NA 144 141 141 142 143  < > 145  < > 140 143 140  K 4.9 5.0 4.2 3.8 4.3  < > 5.0  < > 3.4* 3.5 3.9  CL 115* 113* 109 110 108  --  111*  --   --   --   --   CO2 18* 18* 18* 19 16*  < > 21  < > 18* 20* 19*  GLUCOSE 95 92 94 120* 113*  < > 111*  < > 128 130 131  BUN 27* 26* 30* 44* 36*  < > 38*  < > 36.6* 40.4* 39.1*  CREATININE 2.56* 2.38* 2.71* 2.97* 3.49*  < > 3.27*  < > 3.1* 2.9* 2.9*  CALCIUM 9.0 9.3 9.3 9.0 8.6  < > 9.6  < > 8.9 9.0 8.6  GFRNONAA 26* 28* 24*  --   --   --   --   --   --   --   --   GFRAA 30* 33* 28*  --   --   --   --   --   --   --   --   PROT 6.5 6.5 6.6  --   --   < >  --   < > 6.2* 6.3* 6.0*  ALBUMIN 3.7 3.8 4.0 4.1 3.7  < >  --   < > 3.2* 3.4* 3.3*  AST $Re'17 17 18  'yDF$ --   --   < >  --   < > $R'15 18 31  'xa$ ALT $'10  11 11  'V$ --   --   < >  --   < > 14 16 39  ALKPHOS 62 66 71  --   --   < >  --   < > 67 65 102  BILITOT 0.3 0.3 0.3  --   --   < >  --   < > 0.43 0.38 0.38  < > = values in this interval not displayed. . Lab Results  Component Value Date   TOTALPROTELP 5.7* 11/12/2014   ALBUMINELP 3.5* 11/12/2014   A1GS 0.3 11/12/2014   A2GS 0.9 11/12/2014   BETS 0.4 11/12/2014   BETA2SER 0.3 11/12/2014   GAMS 0.4* 11/12/2014   SPEI * 11/12/2014   (this displays SPEP labs)  Lab Results  Component Value Date   KPAFRELGTCHN 3.81* 11/19/2014   LAMBDASER 2.67* 11/19/2014   KAPLAMBRATIO 1.43 11/19/2014   (kappa/lambda light chains)  BONE MARROW BIOPSY RESULTS 09/28/2014:   RADIOGRAPHIC STUDIES: I have personally reviewed the radiological images as listed and agreed with the findings in the report. No results found. Echocardiogram 10/07/2014  Study Conclusions  - Left ventricle: The cavity size was normal. Wall thickness was increased in a pattern of moderate LVH. There was mild focal basal hypertrophy of the septum. Systolic function was normal. The estimated ejection fraction was in the range of 55% to 60%. Wall motion was normal; there were no regional wall motion abnormalities. Features are consistent with a pseudonormal left ventricular filling pattern, with concomitant abnormal relaxation and increased filling pressure (grade 2 diastolic dysfunction). - Aortic  valve: There was very mild stenosis. There was mild regurgitation. Valve area (VTI): 2.67 cm^2. Valve area (Vmax): 2.49 cm^2. Valve area (Vmean): 2.3 cm^2. - Mitral valve: There was mild regurgitation.  Impressions:  - Normal LV function; grade 2 diastolic dysfunction; mild to moderate LVH with proximal septal thickening; calcified aortic valve with very mild AS (mean gradient 10 mmHg) and mild AI; mild MR; trace TR. Global longitudinal strain - 17%.   ASSESSMENT & PLAN:    Mr Jose Garrett is very  pleasant 64 yo caucasian male with   1) AL Amyloidosis noted on recent renal biopsy with nephrotic range proteinuria on urinalysis and some lambda free light chains on serum and urinary IFE . SPEP with small amount of M-spike 0.3  Echo showed no systolic dysfunction but grade 2 diastolic dysfunction. [facility] cardiac staging 1 as per troponin and BNP criteria. Bone marrow with no overt evidence of multiple myeloma. PET/CT scan showed no evidence of bony lesions or lymphadenopathy . Urine showed about 15 g of protein in 24 hours   Now after 1 month of treatment the M-spike is down to 0.1gm/dl and urinary total protein is down to about 7.6g/24hours 2) multifactorial anemia - likely multifactorial -from AL amyloidosis, anemia from CKD, acute blood loss anemia due to large perinephric hematoma related to recent renal bx on 09/08/2014. Hematoma noted to be stable on rpt US renal on 09/17/2014. 3) CKD -related to hypertension and AL Amyloidosis. Creatinine is 2.9 today and is improved from his previous high of 3.5. Decreasing proteinuria 4) Feet tingling and numbness ? Related to AL Amyloidosis vs Velcade vs B12 deficiency 5] increased bilateral pedal edema  Plan Continue reduced dose of cyclophosphamide to 300 mg weekly and Velcade and dexamethasone. -Will add Aranesp to treat anemia from his CKD. Will monitor blood pressure closely. -Patient will get CBC, CMP weekly .  -started the patient on subcutaneous B12 replacement Q30 days -start on low renally dosed neurontin $RemoveBeforeD'200mg'[id number]$  po HS for neuropathy. Switched to liquid form since the patient has not been able to take his Chills. -if persistent or worsening neuropathy will need to potentially dose reduce velcade. -Decrease dexamethasone to 20 mg every weekly and increase Lasix to 40 mg 3 times a day for increased leg swelling. Monitor daily weights.  -Continue close f/u with PCP and nephrology.  Return to care with Dr. [name] in 2 weeks. Continue weekly  labs. Myeloma markers prior to next cycle including SPEP, UPEP, And lambda light chains, urine total protein/creatinine ratio.  I appreciate the privilege of being a part of the medical care of this fantastic gentleman.  All questions were answered. The patient knows to call the clinic with any problems, questions or concerns. I spent 20 minutes counseling the patient face to face. The total time spent in the appointment was 25 minutes and more than 50% was on counseling.   Sullivan Lone MD [facility] Hematology/Oncology Physician [facility]  (Office):       [phone] (Work cell):  [phone] (Fax):           [phone]

## 2014-12-10 ENCOUNTER — Telehealth: Payer: Self-pay | Admitting: *Deleted

## 2014-12-10 ENCOUNTER — Ambulatory Visit (HOSPITAL_BASED_OUTPATIENT_CLINIC_OR_DEPARTMENT_OTHER): Payer: Self-pay

## 2014-12-10 ENCOUNTER — Telehealth: Payer: Self-pay | Admitting: Hematology

## 2014-12-10 VITALS — BP 146/59 | HR 63 | Temp 98.4°F | Resp 16

## 2014-12-10 DIAGNOSIS — E858 Other amyloidosis: Secondary | ICD-10-CM

## 2014-12-10 DIAGNOSIS — E8581 Light chain (AL) amyloidosis: Secondary | ICD-10-CM

## 2014-12-10 DIAGNOSIS — Z5112 Encounter for antineoplastic immunotherapy: Secondary | ICD-10-CM

## 2014-12-10 DIAGNOSIS — Z5111 Encounter for antineoplastic chemotherapy: Secondary | ICD-10-CM

## 2014-12-10 LAB — CBC WITH DIFFERENTIAL/PLATELET
BASO%: 0.6 % (ref 0.0–2.0)
Basophils Absolute: 0 10*3/uL (ref 0.0–0.1)
EOS%: 4.5 % (ref 0.0–7.0)
Eosinophils Absolute: 0.2 10*3/uL (ref 0.0–0.5)
HCT: 27.7 % — ABNORMAL LOW (ref 38.4–49.9)
HGB: 9.3 g/dL — ABNORMAL LOW (ref 13.0–17.1)
LYMPH%: 16.6 % (ref 14.0–49.0)
MCH: 30.5 pg (ref 27.2–33.4)
MCHC: 33.7 g/dL (ref 32.0–36.0)
MCV: 90.3 fL (ref 79.3–98.0)
MONO#: 0.6 10*3/uL (ref 0.1–0.9)
MONO%: 11.6 % (ref 0.0–14.0)
NEUT%: 66.7 % (ref 39.0–75.0)
NEUTROS ABS: 3.3 10*3/uL (ref 1.5–6.5)
Platelets: 124 10*3/uL — ABNORMAL LOW (ref 140–400)
RBC: 3.07 10*6/uL — ABNORMAL LOW (ref 4.20–5.82)
RDW: 16.8 % — ABNORMAL HIGH (ref 11.0–14.6)
WBC: 4.9 10*3/uL (ref 4.0–10.3)
lymph#: 0.8 10*3/uL — ABNORMAL LOW (ref 0.9–3.3)

## 2014-12-10 LAB — COMPREHENSIVE METABOLIC PANEL (CC13)
ALBUMIN: 3.3 g/dL — AB (ref 3.5–5.0)
ALK PHOS: 101 U/L (ref 40–150)
ALT: 51 U/L (ref 0–55)
ANION GAP: 10 meq/L (ref 3–11)
AST: 28 U/L (ref 5–34)
BUN: 32.4 mg/dL — AB (ref 7.0–26.0)
CHLORIDE: 112 meq/L — AB (ref 98–109)
CO2: 19 mEq/L — ABNORMAL LOW (ref 22–29)
CREATININE: 2.6 mg/dL — AB (ref 0.7–1.3)
Calcium: 8.6 mg/dL (ref 8.4–10.4)
EGFR: 25 mL/min/{1.73_m2} — ABNORMAL LOW (ref >90)
Glucose: 138 mg/dl (ref 70–140)
POTASSIUM: 3.5 meq/L (ref 3.5–5.1)
Sodium: 141 mEq/L (ref 136–145)
Total Bilirubin: 0.46 mg/dL (ref 0.20–1.20)
Total Protein: 5.9 g/dL — ABNORMAL LOW (ref 6.4–8.3)

## 2014-12-10 LAB — TECHNOLOGIST REVIEW

## 2014-12-10 MED ORDER — BORTEZOMIB CHEMO SQ INJECTION 3.5 MG (2.5MG/ML)
1.3000 mg/m2 | Freq: Once | INTRAMUSCULAR | Status: AC
  Administered 2014-12-10: 2.5 mg via SUBCUTANEOUS
  Filled 2014-12-10: qty 2.5

## 2014-12-10 MED ORDER — SODIUM CHLORIDE 0.9 % IV SOLN
Freq: Once | INTRAVENOUS | Status: AC
  Administered 2014-12-10: 10:00:00 via INTRAVENOUS
  Filled 2014-12-10: qty 4

## 2014-12-10 MED ORDER — SODIUM CHLORIDE 0.9 % IV SOLN
Freq: Once | INTRAVENOUS | Status: AC
  Administered 2014-12-10: 09:00:00 via INTRAVENOUS

## 2014-12-10 MED ORDER — SODIUM CHLORIDE 0.9 % IV SOLN
300.0000 mg | Freq: Once | INTRAVENOUS | Status: AC
  Administered 2014-12-10: 300 mg via INTRAVENOUS
  Filled 2014-12-10: qty 15

## 2014-12-10 NOTE — Patient Instructions (Signed)
[facility] Discharge Instructions for Patients Receiving Chemotherapy  Today you received the following chemotherapy agents velcade, cytoxan  To help prevent nausea and vomiting after your treatment, we encourage you to take your nausea medication    If you develop nausea and vomiting that is not controlled by your nausea medication, call the clinic.   BELOW ARE SYMPTOMS THAT SHOULD BE REPORTED IMMEDIATELY:  *FEVER GREATER THAN 100.5 F  *CHILLS WITH OR WITHOUT FEVER  NAUSEA AND VOMITING THAT IS NOT CONTROLLED WITH YOUR NAUSEA MEDICATION  *UNUSUAL SHORTNESS OF BREATH  *UNUSUAL BRUISING OR BLEEDING  TENDERNESS IN MOUTH AND THROAT WITH OR WITHOUT PRESENCE OF ULCERS  *URINARY PROBLEMS  *BOWEL PROBLEMS  UNUSUAL RASH Items with * indicate a potential emergency and should be followed up as soon as possible.  Feel free to call the clinic you have any questions or concerns. The clinic phone number is [phone].  Please show the [facility] at check-in to the Emergency Department and triage nurse.

## 2014-12-10 NOTE — Telephone Encounter (Signed)
Per staff message and POF I have scheduled appts. Advised scheduler of appts. JMW  

## 2014-12-10 NOTE — Telephone Encounter (Signed)
per pof to sch pt appt-sent MW email to sch pt trmt-pt aware °

## 2014-12-14 LAB — SPEP & IFE WITH QIG
ABNORMAL PROTEIN BAND1: 0.1 g/dL
ALBUMIN ELP: 3.3 g/dL — AB (ref 3.8–4.8)
ALPHA-2-GLOBULIN: 1 g/dL — AB (ref 0.5–0.9)
Alpha-1-Globulin: 0.5 g/dL — ABNORMAL HIGH (ref 0.2–0.3)
BETA 2: 0.2 g/dL (ref 0.2–0.5)
BETA GLOBULIN: 0.4 g/dL (ref 0.4–0.6)
Gamma Globulin: 0.4 g/dL — ABNORMAL LOW (ref 0.8–1.7)
IGG (IMMUNOGLOBIN G), SERUM: 290 mg/dL — AB (ref 650–1600)
IgA: 49 mg/dL — ABNORMAL LOW (ref 68–379)
IgM, Serum: 63 mg/dL (ref 41–251)
TOTAL PROTEIN, SERUM ELECTROPHOR: 5.7 g/dL — AB (ref 6.1–8.1)

## 2014-12-14 LAB — KAPPA/LAMBDA LIGHT CHAINS
KAPPA FREE LGHT CHN: 3.4 mg/dL — AB (ref 0.33–1.94)
KAPPA LAMBDA RATIO: 1.27 (ref 0.26–1.65)
Lambda Free Lght Chn: 2.68 mg/dL — ABNORMAL HIGH (ref 0.57–2.63)

## 2014-12-17 ENCOUNTER — Ambulatory Visit: Payer: Self-pay

## 2014-12-17 ENCOUNTER — Encounter: Payer: Self-pay | Admitting: *Deleted

## 2014-12-17 ENCOUNTER — Ambulatory Visit (HOSPITAL_BASED_OUTPATIENT_CLINIC_OR_DEPARTMENT_OTHER): Payer: Self-pay | Admitting: Hematology

## 2014-12-17 ENCOUNTER — Telehealth: Payer: Self-pay | Admitting: Hematology

## 2014-12-17 ENCOUNTER — Other Ambulatory Visit (HOSPITAL_BASED_OUTPATIENT_CLINIC_OR_DEPARTMENT_OTHER): Payer: Self-pay

## 2014-12-17 ENCOUNTER — Ambulatory Visit (HOSPITAL_BASED_OUTPATIENT_CLINIC_OR_DEPARTMENT_OTHER): Payer: Self-pay

## 2014-12-17 ENCOUNTER — Encounter: Payer: Self-pay | Admitting: Hematology

## 2014-12-17 VITALS — BP 161/56 | HR 64 | Temp 97.7°F | Resp 18 | Ht 68.5 in | Wt 184.8 lb

## 2014-12-17 DIAGNOSIS — I129 Hypertensive chronic kidney disease with stage 1 through stage 4 chronic kidney disease, or unspecified chronic kidney disease: Secondary | ICD-10-CM

## 2014-12-17 DIAGNOSIS — E858 Other amyloidosis: Secondary | ICD-10-CM

## 2014-12-17 DIAGNOSIS — D649 Anemia, unspecified: Secondary | ICD-10-CM

## 2014-12-17 DIAGNOSIS — E8581 Light chain (AL) amyloidosis: Secondary | ICD-10-CM

## 2014-12-17 DIAGNOSIS — Z5111 Encounter for antineoplastic chemotherapy: Secondary | ICD-10-CM

## 2014-12-17 DIAGNOSIS — Z5112 Encounter for antineoplastic immunotherapy: Secondary | ICD-10-CM

## 2014-12-17 DIAGNOSIS — R609 Edema, unspecified: Secondary | ICD-10-CM

## 2014-12-17 DIAGNOSIS — M7989 Other specified soft tissue disorders: Secondary | ICD-10-CM

## 2014-12-17 DIAGNOSIS — R200 Anesthesia of skin: Secondary | ICD-10-CM

## 2014-12-17 LAB — CBC WITH DIFFERENTIAL/PLATELET
BASO%: 0.4 % (ref 0.0–2.0)
BASOS ABS: 0 10*3/uL (ref 0.0–0.1)
EOS ABS: 0.2 10*3/uL (ref 0.0–0.5)
EOS%: 3.9 % (ref 0.0–7.0)
HCT: 30.4 % — ABNORMAL LOW (ref 38.4–49.9)
HEMOGLOBIN: 10.2 g/dL — AB (ref 13.0–17.1)
LYMPH%: 19.5 % (ref 14.0–49.0)
MCH: 30.7 pg (ref 27.2–33.4)
MCHC: 33.5 g/dL (ref 32.0–36.0)
MCV: 91.6 fL (ref 79.3–98.0)
MONO#: 0.7 10*3/uL (ref 0.1–0.9)
MONO%: 13.3 % (ref 0.0–14.0)
NEUT#: 3.2 10*3/uL (ref 1.5–6.5)
NEUT%: 62.9 % (ref 39.0–75.0)
Platelets: 118 10*3/uL — ABNORMAL LOW (ref 140–400)
RBC: 3.31 10*6/uL — ABNORMAL LOW (ref 4.20–5.82)
RDW: 17.1 % — ABNORMAL HIGH (ref 11.0–14.6)
WBC: 5.1 10*3/uL (ref 4.0–10.3)
lymph#: 1 10*3/uL (ref 0.9–3.3)

## 2014-12-17 LAB — COMPREHENSIVE METABOLIC PANEL (CC13)
ALT: 39 U/L (ref 0–55)
AST: 22 U/L (ref 5–34)
Albumin: 3.6 g/dL (ref 3.5–5.0)
Alkaline Phosphatase: 100 U/L (ref 40–150)
Anion Gap: 11 mEq/L (ref 3–11)
BILIRUBIN TOTAL: 0.53 mg/dL (ref 0.20–1.20)
BUN: 41.8 mg/dL — ABNORMAL HIGH (ref 7.0–26.0)
CO2: 20 meq/L — AB (ref 22–29)
Calcium: 8.9 mg/dL (ref 8.4–10.4)
Chloride: 111 mEq/L — ABNORMAL HIGH (ref 98–109)
Creatinine: 2.8 mg/dL — ABNORMAL HIGH (ref 0.7–1.3)
EGFR: 23 mL/min/{1.73_m2} — AB (ref >90)
GLUCOSE: 100 mg/dL (ref 70–140)
Potassium: 3.5 mEq/L (ref 3.5–5.1)
SODIUM: 142 meq/L (ref 136–145)
TOTAL PROTEIN: 6.5 g/dL (ref 6.4–8.3)

## 2014-12-17 LAB — TECHNOLOGIST REVIEW

## 2014-12-17 MED ORDER — BORTEZOMIB CHEMO SQ INJECTION 3.5 MG (2.5MG/ML)
1.3000 mg/m2 | Freq: Once | INTRAMUSCULAR | Status: AC
  Administered 2014-12-17: 2.5 mg via SUBCUTANEOUS
  Filled 2014-12-17: qty 2.5

## 2014-12-17 MED ORDER — SODIUM CHLORIDE 0.9 % IV SOLN
Freq: Once | INTRAVENOUS | Status: AC
  Administered 2014-12-17: 10:00:00 via INTRAVENOUS

## 2014-12-17 MED ORDER — CYCLOPHOSPHAMIDE CHEMO INJECTION 1 GM
500.0000 mg | Freq: Once | INTRAMUSCULAR | Status: AC
  Administered 2014-12-17: 500 mg via INTRAVENOUS
  Filled 2014-12-17: qty 25

## 2014-12-17 MED ORDER — SODIUM CHLORIDE 0.9 % IV SOLN
Freq: Once | INTRAVENOUS | Status: AC
  Administered 2014-12-17: 10:00:00 via INTRAVENOUS
  Filled 2014-12-17: qty 4

## 2014-12-17 NOTE — Progress Notes (Signed)
[facility] Work  Clinical Social Work was referred by need for CSW follow up for assessment of psychosocial needs.  Clinical Social Worker met with patient at [facility] during infusion to offer support and assess for needs.  Pt reports he met with the [facility] and was approved for ss disability and this should start next month. This is a huge relief for him. CSW reviewed transportation options as this was a new concern. CSW provided SCAT application and directions to complete, as well as, ACS contact number. Pt plans to return next week with completed SCAT application. CSW will check in next week re SCAT application and reassess needs. Pt aware to contact CSW team if other needs arise.   Clinical Social Work interventions: Resource assistance Loren Racer, [place] Worker [place]  [place] Phone: [phone] Fax: [phone]

## 2014-12-17 NOTE — Progress Notes (Signed)
.    Hematology oncology clinic note  Date of service 12/17/2014  Patient Care Team: Jose Nearing, MD as PCP - General (Family Medicine) [name], MD as PCP - Hematology/Oncology (Hematology and Oncology) [name], MD as Consulting Physician (Nephrology)  CHIEF COMPLAINTS: followup for AL amyloidosis.  HISTORY OF PRESENTING ILLNESS: Please see my initial consultation for details on presentation.  INTERVAL HISTORY  [name] is here for his scheduled followup. He notes that he is feeling quite well. No acute new issues. Still having leg swelling right more than left and we discussed about getting an ultrasound the lower extremities to rule out DVT. Tolerated the lower dose of dexamethasone. Has not started taking the gabapentin until last night. Notes that his leg cramps are improved since being off the gemfibrozil. Lower extremity tingling and numbness about the same. On the cuts bothersome and he is up on his feet quite a bit. We talked about considering an autologous metabolic stem cell transplant referral for a discussion regarding this option. His hemoglobin levels are better and we went up on the dose of cyclophosphamide back to 500 milligrams weekly. Blood pressure well controlled with Aranesp dose.   MEDICAL HISTORY:  Past Medical History  Diagnosis Date  . Hypertension Dx 2012  . Hyperlipidemia Dx 2012  . History of blood transfusion 09/08/2014    "got hematoma after renal biopsy & HgB dropped"  . Anemia Dx 2016  . Family history of adverse reaction to anesthesia     "my daughter can't take certain anesthesia agents" (09/08/2014)  . Enlarged heart 2015  . Chronic diastolic CHF (congestive heart failure), NYHA class 1 ([place])     /hospital problem list 09/08/2014  . Renal disorder     "there is a spot on my kidney" (09/08/2014)  . Chronic renal disease, stage III     /hospital problem list 09/08/2014    SURGICAL HISTORY: Past Surgical History    Procedure Laterality Date  . Renal biopsy, percutaneous Right 09/08/2014  . Tonsillectomy  ~ 1960  . Laparoscopic cholecystectomy  03/2011    SOCIAL HISTORY: Social History   Social History  . Marital Status: Divorced    Spouse Name: N/A  . Number of Children: N/A  . Years of Education: N/A   Occupational History  . Not on file.   Social History Main Topics  . Smoking status: Former Smoker -- 2.00 packs/day for 40 years    Types: Cigarettes    Quit date: 03/19/2011  . Smokeless tobacco: Never Used  . Alcohol Use: No  . Drug Use: No     Comment: 09/08/2014 "I have used marijuana till the 1990's". Notes that he quit 15 yrs ago  . Sexual Activity: Not Currently   Other Topics Concern  . 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 Lives with his daughter and her finance.  FAMILY HISTORY: Family History  Problem Relation Age of Onset  . Hypertension Mother   . Diabetes Mother   . Heart disease Mother   . Skin cancer Mother   . Hypertension Father   . Diabetes Father   . Diabetes Sister     ALLERGIES:  has No Known Allergies.  MEDICATIONS:  Current Outpatient Prescriptions  Medication Sig Dispense Refill  . acyclovir (ZOVIRAX) 400 MG tablet Take 1 tablet (400 mg total) by mouth daily. (Dose adjusted for renal function) 60 tablet 3  . amLODipine (  NORVASC) 10 MG tablet Take 1 tablet (10 mg total) by mouth daily. (Patient taking differently: Take 10 mg by mouth daily with lunch. ) 30 tablet 3  . b complex vitamins capsule Take 1 capsule by mouth daily. 60 capsule 1  . dexamethasone (DECADRON) 4 MG tablet Take 5 tablets (20 mg total) by mouth once a week. On Day 1,8,15 and 22. 30-39mins prior to cyclophosphamide 40 tablet 1  . furosemide (LASIX) 40 MG tablet Take 1 tablet (40 mg total) by mouth 2 (two) times daily. 90 tablet 1  . gabapentin (NEURONTIN) 250 MG/5ML solution Take 4 mLs (200 mg total) by  mouth at bedtime. 120 mL 1  . hydrALAZINE (APRESOLINE) 50 MG tablet Take 1 tablet (50 mg total) by mouth 3 (three) times daily. 120 tablet 2  . LORazepam (ATIVAN) 1 MG tablet Take 1 tablet (1 mg total) by mouth every 8 (eight) hours as needed for anxiety (or nausea). 30 tablet 0  . metoprolol (LOPRESSOR) 100 MG tablet TAKE 1 TABLET BY MOUTH 2 TIMES A DAY 60 tablet 3  . ondansetron (ZOFRAN) 8 MG tablet Take 1 tablet (8 mg total) by mouth 2 (two) times daily as needed (Nausea or vomiting). 30 tablet 1  . prochlorperazine (COMPAZINE) 10 MG tablet Take 1 tablet (10 mg total) by mouth every 6 (six) hours as needed (Nausea or vomiting). 30 tablet 1  . vitamin E 100 UNIT capsule Take 100 Units by mouth every other day.      No current facility-administered medications for this visit.   Facility-Administered Medications Ordered in Other Visits  Medication Dose Route Frequency Provider Last Rate Last Dose  . 0.9 %  sodium chloride infusion   Intravenous Once [name], MD      . bortezomib SQ (VELCADE) chemo injection 2.5 mg  1.3 mg/m2 (Treatment Plan Actual) Subcutaneous Once [name], MD      . cyclophosphamide (CYTOXAN) 500 mg in sodium chloride 0.9 % 250 mL chemo infusion  500 mg Intravenous Once [name], MD      . ondansetron [facility]) 8 mg in sodium chloride 0.9 % 50 mL IVPB   Intravenous Once [name], MD        REVIEW OF SYSTEMS:   Constitutional: Denies fevers, chills or abnormal night sweats Eyes: Denies blurriness of vision, double vision or watery eyes Ears, nose, mouth, throat, and face: Denies mucositis or sore throat Respiratory: Denies cough, dyspnea or wheezes Cardiovascular: Denies palpitation, chest discomfort or lower extremity swelling Gastrointestinal:  Denies nausea, heartburn or change in bowel habits Skin: Denies abnormal skin rashes Lymphatics: Denies new lymphadenopathy or easy bruising Neurological:Denies numbness, tingling or  new weaknesses Behavioral/Psych: Mood is stable, no new changes  All other systems were reviewed with the patient and are negative.   PHYSICAL EXAMINATION: ECOG PERFORMANCE STATUS: 2 - Symptomatic, <50% confined to bed  Filed Vitals:   12/17/14 0834  BP: 161/56  Pulse: 64  Temp: 97.7 F (36.5 C)  Resp: 18   Filed Weights   12/17/14 0834  Weight: 184 lb 12.8 oz (83.825 kg)    GENERAL:alert, no distress , appears to be in good spirits and is smiling and quite verbal . SKIN: significant conjunctival pallor noted.  EYES: nl EOM, PERL OROPHARYNX:no exudate, no erythema and lips, buccal mucosa, and tongue normal  NECK: no JVD LYMPH:  no palpable lymphadenopathy in the cervical, axillary or inguinal LUNGS: clear to auscultation and percussion with normal breathing  effort HEART: regular rate & rhythm and 2/6 SM aortic area ABDOMEN:abdomen soft, non-tender and normal bowel sounds Musculoskeletal: B/l 1+ pedal edemaredness or tenderness  PSYCH: alert & oriented x 3 with fluent speech NEURO: no focal motor/sensory deficits  LABORATORY DATA:   I have reviewed the data as listed. CBC Latest Ref Rng 12/17/2014 12/10/2014 12/03/2014  WBC 4.0 - 10.3 10e3/uL 5.1 4.9 5.6  Hemoglobin 13.0 - 17.1 g/dL 10.2(L) 9.3(L) 8.5(L)  Hematocrit 38.4 - 49.9 % 30.4(L) 27.7(L) 25.0(L)  Platelets 140 - 400 10e3/uL 118(L) 124(L) 126(L)       Recent Labs  02/25/14 0959 03/02/14 1227 06/30/14 1528 08/18/14 1537 09/15/14 1539  10/04/14 1223  12/03/14 0811 12/10/14 0819 12/17/14 0826  NA 144 141 141 142 143  < > 145  < > 140 141 142  K 4.9 5.0 4.2 3.8 4.3  < > 5.0  < > 3.9 3.5 3.5  CL 115* 113* 109 110 108  --  111*  --   --   --   --   CO2 18* 18* 18* 19 16*  < > 21  < > 19* 19* 20*  GLUCOSE 95 92 94 120* 113*  < > 111*  < > 131 138 100  BUN 27* 26* 30* 44* 36*  < > 38*  < > 39.1* 32.4* 41.8*  CREATININE 2.56* 2.38* 2.71* 2.97* 3.49*  < > 3.27*  < > 2.9* 2.6* 2.8*  CALCIUM 9.0 9.3 9.3 9.0 8.6   < > 9.6  < > 8.6 8.6 8.9  GFRNONAA 26* 28* 24*  --   --   --   --   --   --   --   --   GFRAA 30* 33* 28*  --   --   --   --   --   --   --   --   PROT 6.5 6.5 6.6  --   --   < >  --   < > 6.0* 5.9* 6.5  ALBUMIN 3.7 3.8 4.0 4.1 3.7  < >  --   < > 3.3* 3.3* 3.6  AST $Re'17 17 18  'xKQ$ --   --   < >  --   < > $R'31 28 22  'LD$ ALT $'10 11 11  'I$ --   --   < >  --   < > 39 51 39  ALKPHOS 62 66 71  --   --   < >  --   < > 102 101 100  BILITOT 0.3 0.3 0.3  --   --   < >  --   < > 0.38 0.46 0.53  < > = values in this interval not displayed. . Lab Results  Component Value Date   TOTALPROTELP 5.7* 12/10/2014   ALBUMINELP 3.3* 12/10/2014   A1GS 0.5* 12/10/2014   A2GS 1.0* 12/10/2014   BETS 0.4 12/10/2014   BETA2SER 0.2 12/10/2014   GAMS 0.4* 12/10/2014   SPEI * 12/10/2014   (this displays SPEP labs)  Lab Results  Component Value Date   KPAFRELGTCHN 3.40* 12/10/2014   LAMBDASER 2.68* 12/10/2014   KAPLAMBRATIO 1.27 12/10/2014   (kappa/lambda light chains)  BONE MARROW BIOPSY RESULTS 09/28/2014:   RADIOGRAPHIC STUDIES: I have personally reviewed the radiological images as listed and agreed with the findings in the report. No results found. Echocardiogram 10/07/2014  Study Conclusions  - Left ventricle: The cavity size was normal. Wall thickness was increased in a pattern  of moderate LVH. There was mild focal basal hypertrophy of the septum. Systolic function was normal. The estimated ejection fraction was in the range of 55% to 60%. Wall motion was normal; there were no regional wall motion abnormalities. Features are consistent with a pseudonormal left ventricular filling pattern, with concomitant abnormal relaxation and increased filling pressure (grade 2 diastolic dysfunction). - Aortic valve: There was very mild stenosis. There was mild regurgitation. Valve area (VTI): 2.67 cm^2. Valve area (Vmax): 2.49 cm^2. Valve area (Vmean): 2.3 cm^2. - Mitral valve: There was mild  regurgitation.  Impressions:  - Normal LV function; grade 2 diastolic dysfunction; mild to moderate LVH with proximal septal thickening; calcified aortic valve with very mild AS (mean gradient 10 mmHg) and mild AI; mild MR; trace TR. Global longitudinal strain - 17%.   ASSESSMENT & PLAN:    Mr Jose Garrett is very pleasant 64 yo caucasian male with   1) AL Amyloidosis noted on recent renal biopsy with nephrotic range proteinuria on urinalysis and some lambda free light chains on serum and urinary IFE . On diagnosis:  SPEP with small amount of M-spike 0.3  Echo showed no systolic dysfunction but grade 2 diastolic dysfunction. [facility] cardiac staging 1 as per troponin and BNP criteria. Bone marrow with no overt evidence of multiple myeloma. PET/CT scan showed no evidence of bony lesions or lymphadenopathy . Urine showed about 15 g of protein in 24 hours   After 1 month of treatment the M-spike is down to 0.1gm/dl and urinary total protein is down to about 7.6g/24hours  After 2 months of treatment: M-spike in 0.1g/dl lambda free light chains improved and urinary total protein down to about 4 g per 24 hours With no monoclonal free light chains in the urine.  2) multifactorial anemia - likely multifactorial -from AL amyloidosis, anemia from CKD, acute blood loss anemia due to large perinephric hematoma related to recent renal bx on 09/08/2014. Hematoma noted to be stable on rpt US renal on 09/17/2014. 3) CKD -related to hypertension and AL Amyloidosis. Creatinine is 2.8 today and is improved from his previous high of 3.5. Decreasing proteinuria 4) Feet tingling and numbness ? Related to AL Amyloidosis vs Velcade vs B12 deficiency stable. Started taking gabapentin 12/16/2014. 5)Bilateral pedal edema right more than left about the same as last time despite increasing his Lasix and decreased dexamethasone.   Plan -Ultrasound lower extremities venous Doppler to rule out DVT  -Continue  darbepoetin 40 g every 28 days -Continue subdue his B12 daily 28 days  -Given improvement of hemoglobin with darbepoetin we will increase  dose of cyclophosphamide to 500 mg weekly and continue Velcade and dexamethasone (20mg  qweekly). -Patient will get CBC, CMP weekly and myeloma labs every monthly -continue on low renally dosed neurontin 200mg  po HS for neuropathy.  -if persistent or worsening neuropathy will need to potentially dose reduce velcade. -Monitor blood pressure with darbepoetin. -Discussed with patient regarding his input about autologous stem cell transplant referral. He wants to think about it and has not particularly thrilled about it but will let me know on the next visit. -Continue close f/u with PCP and nephrology.  Return to care with Dr. [name] in 2 weeks. Continue weekly labs. Myeloma markers prior to next cycle including SPEP, UPEP, And lambda light chains, urine total protein/creatinine ratio.  I appreciate the privilege of being a part of the medical care of this fantastic gentleman.  All questions were answered. The patient knows to call the clinic with  any problems, questions or concerns. I spent 20 minutes counseling the patient face to face. The total time spent in the appointment was 25 minutes and more than 50% was on counseling.   Sullivan Lone MD [place] Hematology/Oncology Physician [facility]  (Office):       [phone] (Work cell):  [phone] (Fax):           [phone]

## 2014-12-17 NOTE — Patient Instructions (Signed)
[facility] Discharge Instructions for Patients Receiving Chemotherapy  Today you received the following chemotherapy agents: Velcade, Cytoxan.  To help prevent nausea and vomiting after your treatment, we encourage you to take your nausea medication: Compazine 10 mg every 6 hours as needed, Zofran 8 mg every 12 hours as needed.   If you develop nausea and vomiting that is not controlled by your nausea medication, call the clinic.   BELOW ARE SYMPTOMS THAT SHOULD BE REPORTED IMMEDIATELY:  *FEVER GREATER THAN 100.5 F  *CHILLS WITH OR WITHOUT FEVER  NAUSEA AND VOMITING THAT IS NOT CONTROLLED WITH YOUR NAUSEA MEDICATION  *UNUSUAL SHORTNESS OF BREATH  *UNUSUAL BRUISING OR BLEEDING  TENDERNESS IN MOUTH AND THROAT WITH OR WITHOUT PRESENCE OF ULCERS  *URINARY PROBLEMS  *BOWEL PROBLEMS  UNUSUAL RASH Items with * indicate a potential emergency and should be followed up as soon as possible.  Feel free to call the clinic you have any questions or concerns. The clinic phone number is [phone].  Please show the [place] at check-in to the Emergency Department and triage nurse.

## 2014-12-17 NOTE — Progress Notes (Signed)
OK to treat per Dr. [name] with elevated SCr.

## 2014-12-17 NOTE — Telephone Encounter (Signed)
Gave adn printed appt sched and avs for pt for OCT and NOV °

## 2014-12-20 LAB — UIFE/LIGHT CHAINS/TP QN, 24-HR UR
ALPHA 2 UR: DETECTED — AB
Albumin, U: DETECTED
Alpha 1, Urine: DETECTED — AB
BETA UR: DETECTED — AB
Gamma Globulin, Urine: DETECTED — AB
TOTAL PROTEIN, URINE-UPE24: 218 mg/dL — AB (ref 5–25)
Time: 24 hours
Volume, Urine: 1850 mL

## 2014-12-20 LAB — 24 HR URINE,KAPPA/LAMBDA LIGHT CHAINS
24H Urine Volume: 1850 mL/24 h
Measured Kappa Chain: 1.97 mg/dL (ref <2.00)
Measured Lambda Chain: 1.84 mg/dL (ref <2.00)
TOTAL LAMBDA CHAIN: 34.04 mg/(24.h)
Total Kappa Chain: 36.45 mg/24 h

## 2014-12-24 ENCOUNTER — Ambulatory Visit (HOSPITAL_BASED_OUTPATIENT_CLINIC_OR_DEPARTMENT_OTHER): Payer: Self-pay

## 2014-12-24 ENCOUNTER — Ambulatory Visit: Payer: Self-pay | Admitting: Hematology

## 2014-12-24 ENCOUNTER — Encounter: Payer: Self-pay | Admitting: *Deleted

## 2014-12-24 ENCOUNTER — Other Ambulatory Visit (HOSPITAL_BASED_OUTPATIENT_CLINIC_OR_DEPARTMENT_OTHER): Payer: Self-pay

## 2014-12-24 VITALS — BP 157/75 | HR 65 | Temp 98.1°F

## 2014-12-24 DIAGNOSIS — E858 Other amyloidosis: Secondary | ICD-10-CM

## 2014-12-24 DIAGNOSIS — E8581 Light chain (AL) amyloidosis: Secondary | ICD-10-CM

## 2014-12-24 DIAGNOSIS — Z5111 Encounter for antineoplastic chemotherapy: Secondary | ICD-10-CM

## 2014-12-24 DIAGNOSIS — Z5112 Encounter for antineoplastic immunotherapy: Secondary | ICD-10-CM

## 2014-12-24 LAB — CBC WITH DIFFERENTIAL/PLATELET
BASO%: 0.7 % (ref 0.0–2.0)
Basophils Absolute: 0 10*3/uL (ref 0.0–0.1)
EOS ABS: 0.2 10*3/uL (ref 0.0–0.5)
EOS%: 3.9 % (ref 0.0–7.0)
HCT: 28.9 % — ABNORMAL LOW (ref 38.4–49.9)
HEMOGLOBIN: 9.7 g/dL — AB (ref 13.0–17.1)
LYMPH%: 22.5 % (ref 14.0–49.0)
MCH: 30.5 pg (ref 27.2–33.4)
MCHC: 33.7 g/dL (ref 32.0–36.0)
MCV: 90.6 fL (ref 79.3–98.0)
MONO#: 0.5 10*3/uL (ref 0.1–0.9)
MONO%: 10.7 % (ref 0.0–14.0)
NEUT%: 62.2 % (ref 39.0–75.0)
NEUTROS ABS: 3.1 10*3/uL (ref 1.5–6.5)
PLATELETS: 125 10*3/uL — AB (ref 140–400)
RBC: 3.19 10*6/uL — AB (ref 4.20–5.82)
RDW: 16.4 % — ABNORMAL HIGH (ref 11.0–14.6)
WBC: 4.9 10*3/uL (ref 4.0–10.3)
lymph#: 1.1 10*3/uL (ref 0.9–3.3)

## 2014-12-24 LAB — COMPREHENSIVE METABOLIC PANEL (CC13)
ALBUMIN: 3.3 g/dL — AB (ref 3.5–5.0)
ALK PHOS: 91 U/L (ref 40–150)
ALT: 24 U/L (ref 0–55)
ANION GAP: 12 meq/L — AB (ref 3–11)
AST: 15 U/L (ref 5–34)
BILIRUBIN TOTAL: 0.46 mg/dL (ref 0.20–1.20)
BUN: 44.1 mg/dL — ABNORMAL HIGH (ref 7.0–26.0)
CALCIUM: 8.8 mg/dL (ref 8.4–10.4)
CO2: 17 mEq/L — ABNORMAL LOW (ref 22–29)
Chloride: 113 mEq/L — ABNORMAL HIGH (ref 98–109)
Creatinine: 2.8 mg/dL — ABNORMAL HIGH (ref 0.7–1.3)
EGFR: 23 mL/min/{1.73_m2} — AB (ref >90)
Glucose: 142 mg/dl — ABNORMAL HIGH (ref 70–140)
Potassium: 3.6 mEq/L (ref 3.5–5.1)
Sodium: 142 mEq/L (ref 136–145)
TOTAL PROTEIN: 5.9 g/dL — AB (ref 6.4–8.3)

## 2014-12-24 LAB — TECHNOLOGIST REVIEW

## 2014-12-24 MED ORDER — SODIUM CHLORIDE 0.9 % IV SOLN
500.0000 mg | Freq: Once | INTRAVENOUS | Status: AC
  Administered 2014-12-24: 500 mg via INTRAVENOUS
  Filled 2014-12-24: qty 25

## 2014-12-24 MED ORDER — BORTEZOMIB CHEMO SQ INJECTION 3.5 MG (2.5MG/ML)
1.3000 mg/m2 | Freq: Once | INTRAMUSCULAR | Status: AC
  Administered 2014-12-24: 2.5 mg via SUBCUTANEOUS
  Filled 2014-12-24: qty 2.5

## 2014-12-24 MED ORDER — SODIUM CHLORIDE 0.9 % IV SOLN
Freq: Once | INTRAVENOUS | Status: AC
Start: 2014-12-24 — End: 2014-12-24
  Administered 2014-12-24: 10:00:00 via INTRAVENOUS

## 2014-12-24 MED ORDER — SODIUM CHLORIDE 0.9 % IV SOLN
Freq: Once | INTRAVENOUS | Status: AC
  Administered 2014-12-24: 11:00:00 via INTRAVENOUS
  Filled 2014-12-24: qty 4

## 2014-12-24 NOTE — Progress Notes (Signed)
[place] Work  Clinical Social Work met with pt briefly in the infusion room to reassess needs, check on SCAT application and to check in. Pt reports to be doing well currently, he forgot to return with SCAT application today. He reports overall that things are smooth this week and denies issues, needs or concerns currently. Pt reports he will attempt to return SCAT application at next appointment. CSW to follow and assist as needed.   Clinical Social Work interventions: Resource assistance  Loren Racer, [place] Worker [place]  [place] Phone: [phone] Fax: [phone]

## 2014-12-24 NOTE — Patient Instructions (Signed)
[facility] Discharge Instructions for Patients Receiving Chemotherapy  Today you received the following chemotherapy agents:  Cytoxan, Velcade  To help prevent nausea and vomiting after your treatment, we encourage you to take your nausea medication as directed.   If you develop nausea and vomiting that is not controlled by your nausea medication, call the clinic.   BELOW ARE SYMPTOMS THAT SHOULD BE REPORTED IMMEDIATELY:  *FEVER GREATER THAN 100.5 F  *CHILLS WITH OR WITHOUT FEVER  NAUSEA AND VOMITING THAT IS NOT CONTROLLED WITH YOUR NAUSEA MEDICATION  *UNUSUAL SHORTNESS OF BREATH  *UNUSUAL BRUISING OR BLEEDING  TENDERNESS IN MOUTH AND THROAT WITH OR WITHOUT PRESENCE OF ULCERS  *URINARY PROBLEMS  *BOWEL PROBLEMS  UNUSUAL RASH Items with * indicate a potential emergency and should be followed up as soon as possible.  Feel free to call the clinic you have any questions or concerns. The clinic phone number is [phone].  Please show the [place] at check-in to the Emergency Department and triage nurse.

## 2014-12-29 ENCOUNTER — Telehealth: Payer: Self-pay | Admitting: Family Medicine

## 2014-12-29 NOTE — Telephone Encounter (Signed)
Pt. Called requesting a med refill on amLODipine (NORVASC) 10 MG tablet. Please f/u with pt.

## 2014-12-30 ENCOUNTER — Other Ambulatory Visit: Payer: Self-pay | Admitting: Hematology

## 2014-12-30 DIAGNOSIS — J0100 Acute maxillary sinusitis, unspecified: Secondary | ICD-10-CM

## 2014-12-30 MED ORDER — AMOXICILLIN-POT CLAVULANATE 250-62.5 MG/5ML PO SUSR: 500.0000 mg | Freq: Two times a day (BID) | ORAL | Status: DC

## 2014-12-31 ENCOUNTER — Other Ambulatory Visit (HOSPITAL_BASED_OUTPATIENT_CLINIC_OR_DEPARTMENT_OTHER): Payer: Self-pay

## 2014-12-31 ENCOUNTER — Ambulatory Visit (HOSPITAL_BASED_OUTPATIENT_CLINIC_OR_DEPARTMENT_OTHER): Payer: Self-pay

## 2014-12-31 ENCOUNTER — Encounter: Payer: Self-pay | Admitting: Hematology

## 2014-12-31 ENCOUNTER — Ambulatory Visit (HOSPITAL_BASED_OUTPATIENT_CLINIC_OR_DEPARTMENT_OTHER): Payer: Self-pay | Admitting: Hematology

## 2014-12-31 ENCOUNTER — Ambulatory Visit (HOSPITAL_COMMUNITY)
Admission: RE | Admit: 2014-12-31 | Discharge: 2014-12-31 | Disposition: A | Discharge status: Home / Self Care | Payer: Self-pay | Source: Ambulatory Visit | Attending: Hematology | Admitting: Hematology

## 2014-12-31 VITALS — BP 155/53 | HR 72 | Temp 98.1°F | Resp 20 | Ht 68.0 in | Wt 194.0 lb

## 2014-12-31 DIAGNOSIS — M7989 Other specified soft tissue disorders: Secondary | ICD-10-CM

## 2014-12-31 DIAGNOSIS — D638 Anemia in other chronic diseases classified elsewhere: Secondary | ICD-10-CM

## 2014-12-31 DIAGNOSIS — E858 Other amyloidosis: Secondary | ICD-10-CM

## 2014-12-31 DIAGNOSIS — R609 Edema, unspecified: Secondary | ICD-10-CM

## 2014-12-31 DIAGNOSIS — R200 Anesthesia of skin: Secondary | ICD-10-CM

## 2014-12-31 DIAGNOSIS — E8581 Light chain (AL) amyloidosis: Secondary | ICD-10-CM

## 2014-12-31 DIAGNOSIS — E538 Deficiency of other specified B group vitamins: Secondary | ICD-10-CM

## 2014-12-31 DIAGNOSIS — G629 Polyneuropathy, unspecified: Secondary | ICD-10-CM

## 2014-12-31 DIAGNOSIS — Z5112 Encounter for antineoplastic immunotherapy: Secondary | ICD-10-CM

## 2014-12-31 DIAGNOSIS — I129 Hypertensive chronic kidney disease with stage 1 through stage 4 chronic kidney disease, or unspecified chronic kidney disease: Secondary | ICD-10-CM

## 2014-12-31 DIAGNOSIS — D649 Anemia, unspecified: Secondary | ICD-10-CM

## 2014-12-31 DIAGNOSIS — J019 Acute sinusitis, unspecified: Secondary | ICD-10-CM

## 2014-12-31 DIAGNOSIS — B9689 Other specified bacterial agents as the cause of diseases classified elsewhere: Secondary | ICD-10-CM

## 2014-12-31 DIAGNOSIS — Z5111 Encounter for antineoplastic chemotherapy: Secondary | ICD-10-CM

## 2014-12-31 LAB — COMPREHENSIVE METABOLIC PANEL (CC13)
ALBUMIN: 3.1 g/dL — AB (ref 3.5–5.0)
ALK PHOS: 144 U/L (ref 40–150)
ALT: 58 U/L — AB (ref 0–55)
ANION GAP: 10 meq/L (ref 3–11)
AST: 28 U/L (ref 5–34)
BILIRUBIN TOTAL: 0.37 mg/dL (ref 0.20–1.20)
BUN: 46.1 mg/dL — ABNORMAL HIGH (ref 7.0–26.0)
CALCIUM: 8.7 mg/dL (ref 8.4–10.4)
CO2: 17 mEq/L — ABNORMAL LOW (ref 22–29)
CREATININE: 3 mg/dL — AB (ref 0.7–1.3)
Chloride: 112 mEq/L — ABNORMAL HIGH (ref 98–109)
EGFR: 21 mL/min/{1.73_m2} — ABNORMAL LOW (ref >90)
Glucose: 119 mg/dl (ref 70–140)
Potassium: 3.5 mEq/L (ref 3.5–5.1)
Sodium: 140 mEq/L (ref 136–145)
TOTAL PROTEIN: 6 g/dL — AB (ref 6.4–8.3)

## 2014-12-31 LAB — CBC WITH DIFFERENTIAL/PLATELET
BASO%: 0.6 % (ref 0.0–2.0)
Basophils Absolute: 0 10*3/uL (ref 0.0–0.1)
EOS%: 2.2 % (ref 0.0–7.0)
Eosinophils Absolute: 0.2 10*3/uL (ref 0.0–0.5)
HEMATOCRIT: 26.9 % — AB (ref 38.4–49.9)
HEMOGLOBIN: 8.9 g/dL — AB (ref 13.0–17.1)
LYMPH#: 0.8 10*3/uL — AB (ref 0.9–3.3)
LYMPH%: 11.1 % — ABNORMAL LOW (ref 14.0–49.0)
MCH: 30.4 pg (ref 27.2–33.4)
MCHC: 33.1 g/dL (ref 32.0–36.0)
MCV: 91.7 fL (ref 79.3–98.0)
MONO#: 0.7 10*3/uL (ref 0.1–0.9)
MONO%: 9.9 % (ref 0.0–14.0)
NEUT%: 76.2 % — AB (ref 39.0–75.0)
NEUTROS ABS: 5.3 10*3/uL (ref 1.5–6.5)
PLATELETS: 111 10*3/uL — AB (ref 140–400)
RBC: 2.93 10*6/uL — ABNORMAL LOW (ref 4.20–5.82)
RDW: 15.8 % — AB (ref 11.0–14.6)
WBC: 6.9 10*3/uL (ref 4.0–10.3)

## 2014-12-31 MED ORDER — CYCLOPHOSPHAMIDE CHEMO INJECTION 1 GM
500.0000 mg | Freq: Once | INTRAMUSCULAR | Status: AC
  Administered 2014-12-31: 500 mg via INTRAVENOUS
  Filled 2014-12-31: qty 25

## 2014-12-31 MED ORDER — CYANOCOBALAMIN 1000 MCG/ML IJ SOLN
1000.0000 ug | Freq: Once | INTRAMUSCULAR | Status: AC
Start: 2014-12-31 — End: 2014-12-31
  Administered 2014-12-31: 1000 ug via SUBCUTANEOUS

## 2014-12-31 MED ORDER — DARBEPOETIN ALFA 100 MCG/0.5ML IJ SOSY
40.0000 ug | PREFILLED_SYRINGE | Freq: Once | INTRAMUSCULAR | Status: AC
  Administered 2014-12-31: 40 ug via SUBCUTANEOUS
  Filled 2014-12-31: qty 0.5

## 2014-12-31 MED ORDER — CYANOCOBALAMIN 1000 MCG/ML IJ SOLN
INTRAMUSCULAR | Status: AC
  Filled 2014-12-31: qty 1

## 2014-12-31 MED ORDER — AZITHROMYCIN 250 MG PO TABS: 500.0000 mg | ORAL_TABLET | Freq: Every day | ORAL | Status: DC

## 2014-12-31 MED ORDER — AZITHROMYCIN 200 MG/5ML PO SUSR: 500.0000 mg | Freq: Every day | ORAL | Status: AC

## 2014-12-31 MED ORDER — BORTEZOMIB CHEMO SQ INJECTION 3.5 MG (2.5MG/ML)
1.3000 mg/m2 | Freq: Once | INTRAMUSCULAR | Status: AC
  Administered 2014-12-31: 2.5 mg via SUBCUTANEOUS
  Filled 2014-12-31: qty 2.5

## 2014-12-31 MED ORDER — AMOXICILLIN-POT CLAVULANATE 250-62.5 MG/5ML PO SUSR: 500.0000 mg | Freq: Two times a day (BID) | ORAL | Status: AC

## 2014-12-31 MED ORDER — SODIUM CHLORIDE 0.9 % IV SOLN
Freq: Once | INTRAVENOUS | Status: AC
  Administered 2014-12-31: 10:00:00 via INTRAVENOUS

## 2014-12-31 MED ORDER — SODIUM CHLORIDE 0.9 % IV SOLN
Freq: Once | INTRAVENOUS | Status: AC
  Administered 2014-12-31: 10:00:00 via INTRAVENOUS
  Filled 2014-12-31: qty 4

## 2014-12-31 NOTE — Progress Notes (Signed)
VASCULAR LAB PRELIMINARY  PRELIMINARY  PRELIMINARY  PRELIMINARY  Bilateral lower extremity venous duplex completed.    Preliminary report:  Bilateral:  No evidence of DVT, superficial thrombosis, or Baker's Cyst.   Jose Garrett, RVS 12/31/2014, 1:38 PM

## 2014-12-31 NOTE — Patient Instructions (Signed)
Darbepoetin Alfa injection What is this medicine? DARBEPOETIN ALFA (dar be POE e tin AL fa) helps your body make more red blood cells. It is used to treat anemia caused by chronic kidney failure and chemotherapy. This medicine may be used for other purposes; ask your health care provider or pharmacist if you have questions. What should I tell my health care provider before I take this medicine? They need to know if you have any of these conditions: -blood clotting disorders or history of blood clots -cancer patient not on chemotherapy -cystic fibrosis -heart disease, such as angina, heart failure, or a history of a heart attack -hemoglobin level of 12 g/dL or greater -high blood pressure -low levels of folate, iron, or vitamin B12 -seizures -an unusual or allergic reaction to darbepoetin, erythropoietin, albumin, hamster proteins, latex, other medicines, foods, dyes, or preservatives -pregnant or trying to get pregnant -breast-feeding How should I use this medicine? This medicine is for injection into a vein or under the skin. It is usually given by a health care professional in a hospital or clinic setting. If you get this medicine at home, you will be taught how to prepare and give this medicine. Do not shake the solution before you withdraw a dose. Use exactly as directed. Take your medicine at regular intervals. Do not take your medicine more often than directed. It is important that you put your used needles and syringes in a special sharps container. Do not put them in a trash can. If you do not have a sharps container, call your pharmacist or healthcare provider to get one. Talk to your pediatrician regarding the use of this medicine in children. While this medicine may be used in children as young as 1 year for selected conditions, precautions do apply. Overdosage: If you think you have taken too much of this medicine contact a poison control center or emergency room at once. NOTE:  This medicine is only for you. Do not share this medicine with others. What if I miss a dose? If you miss a dose, take it as soon as you can. If it is almost time for your next dose, take only that dose. Do not take double or extra doses. What may interact with this medicine? Do not take this medicine with any of the following medications: -epoetin alfa This list may not describe all possible interactions. Give your health care provider a list of all the medicines, herbs, non-prescription drugs, or dietary supplements you use. Also tell them if you smoke, drink alcohol, or use illegal drugs. Some items may interact with your medicine. What should I watch for while using this medicine? Visit your prescriber or health care professional for regular checks on your progress and for the needed blood tests and blood pressure measurements. It is especially important for the doctor to make sure your hemoglobin level is in the desired range, to limit the risk of potential side effects and to give you the best benefit. Keep all appointments for any recommended tests. Check your blood pressure as directed. Ask your doctor what your blood pressure should be and when you should contact him or her. As your body makes more red blood cells, you may need to take iron, folic acid, or vitamin B supplements. Ask your doctor or health care provider which products are right for you. If you have kidney disease continue dietary restrictions, even though this medication can make you feel better. Talk with your doctor or health care professional about the   foods you eat and the vitamins that you take. What side effects may I notice from receiving this medicine? Side effects that you should report to your doctor or health care professional as soon as possible: -allergic reactions like skin rash, itching or hives, swelling of the face, lips, or tongue -breathing problems -changes in vision -chest pain -confusion, trouble speaking  or understanding -feeling faint or lightheaded, falls -high blood pressure -muscle aches or pains -pain, swelling, warmth in the leg -rapid weight gain -severe headaches -sudden numbness or weakness of the face, arm or leg -trouble walking, dizziness, loss of balance or coordination -seizures (convulsions) -swelling of the ankles, feet, hands -unusually weak or tired Side effects that usually do not require medical attention (report to your doctor or health care professional if they continue or are bothersome): -diarrhea -fever, chills (flu-like symptoms) -headaches -nausea, vomiting -redness, stinging, or swelling at site where injected This list may not describe all possible side effects. Call your doctor for medical advice about side effects. You may report side effects to FDA at 1-800-FDA-1088. Where should I keep my medicine? Keep out of the reach of children. Store in a refrigerator between 2 and 8 degrees C (36 and 46 degrees F). Do not freeze. Do not shake. Throw away any unused portion if using a single-dose vial. Throw away any unused medicine after the expiration date. NOTE: This sheet is a summary. It may not cover all possible information. If you have questions about this medicine, talk to your doctor, pharmacist, or health care provider.    2016, Elsevier/Gold Standard. (2008-02-10 10:23:57)  [facility] Discharge Instructions for Patients Receiving Chemotherapy  Today you received the following chemotherapy agents Cytoxan/Velcade.  To help prevent nausea and vomiting after your treatment, we encourage you to take your nausea medication as directed.   If you develop nausea and vomiting that is not controlled by your nausea medication, call the clinic.   BELOW ARE SYMPTOMS THAT SHOULD BE REPORTED IMMEDIATELY:  *FEVER GREATER THAN 100.5 F  *CHILLS WITH OR WITHOUT FEVER  NAUSEA AND VOMITING THAT IS NOT CONTROLLED WITH YOUR NAUSEA  MEDICATION  *UNUSUAL SHORTNESS OF BREATH  *UNUSUAL BRUISING OR BLEEDING  TENDERNESS IN MOUTH AND THROAT WITH OR WITHOUT PRESENCE OF ULCERS  *URINARY PROBLEMS  *BOWEL PROBLEMS  UNUSUAL RASH Items with * indicate a potential emergency and should be followed up as soon as possible.  Feel free to call the clinic you have any questions or concerns. The clinic phone number is [phone].  Please show the [place] at check-in to the Emergency Department and triage nurse.

## 2014-12-31 NOTE — Patient Instructions (Signed)
-   Take liq Augmentin or liq Azithromycin (one of the two) depending on which is available with your pharmacy not both. -deep sea saline spray q2h as needed

## 2014-12-31 NOTE — Progress Notes (Signed)
.    Hematology oncology clinic note  Date of service 12/31/2014  Patient Care Team: [name], MD as PCP - General (Family Medicine) [name], MD as PCP - Hematology/Oncology (Hematology and Oncology) [name], MD as Consulting Physician (Nephrology)  CHIEF COMPLAINTS: followup for AL amyloidosis.  HISTORY OF PRESENTING ILLNESS: Please see my initial consultation for details on presentation.  INTERVAL HISTORY  [name] is here for his scheduled 2 week follow-up. Notes that his neuropathy has been well controlled and his potentially little better. No other acute new symptoms. Bilateral lower extremity swelling about the same. Ultrasound of lower extremities showed no evidence of blood clots today. No other overt treatment toxicities. Due for his next dose aranesp today as well as his weekly Velcade and cyclophosphamide. Has been trying to keep physically active. I discussed with him the option of getting a referral for bone marrow transplant consideration at [facility]. He would like to think about this further. Notes that he has developed significant sinus congestion and has been coughing up some yellowish-green phlegm. He was prescribed liquid Augmentin yesterday but has not been able to procure this due to lack of availability with multiple pharmacies. He was given additional prescription of liquid Zithromax today instead of Augmentin in case he cannot require it. He was counseled in detail to take only one of the 2 medications.  MEDICAL HISTORY:  Past Medical History  Diagnosis Date  . Hypertension Dx 2012  . Hyperlipidemia Dx 2012  . History of blood transfusion 09/08/2014    "got hematoma after renal biopsy & HgB dropped"  . Anemia Dx 2016  . Family history of adverse reaction to anesthesia     "my daughter can't take certain anesthesia agents" (09/08/2014)  . Enlarged heart 2015  . Chronic diastolic CHF (congestive heart failure),  NYHA class 1 ([place])     /hospital problem list 09/08/2014  . Renal disorder     "there is a spot on my kidney" (09/08/2014)  . Chronic renal disease, stage III     /hospital problem list 09/08/2014    SURGICAL HISTORY: Past Surgical History  Procedure Laterality Date  . Renal biopsy, percutaneous Right 09/08/2014  . Tonsillectomy  ~ 1960  . Laparoscopic cholecystectomy  03/2011    SOCIAL HISTORY: Social History   Social History  . Marital Status: Divorced    Spouse Name: N/A  . Number of Children: N/A  . Years of Education: N/A   Occupational History  . Not on file.   Social History Main Topics  . Smoking status: Former Smoker -- 2.00 packs/day for 40 years    Types: Cigarettes    Quit date: 03/19/2011  . Smokeless tobacco: Never Used  . Alcohol Use: No  . Drug Use: No     Comment: 09/08/2014 "I have used marijuana till the 1990's". Notes that he quit 15 yrs ago  . Sexual Activity: Not Currently   Other Topics Concern  . 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 Lives with his daughter and her finance.  FAMILY HISTORY: Family History  Problem Relation Age of Onset  . Hypertension Mother   . Diabetes Mother   . Heart disease Mother   . Skin cancer Mother   . Hypertension Father   . Diabetes Father   . Diabetes Sister     ALLERGIES:  has No Known Allergies.  MEDICATIONS:  Current  Outpatient Prescriptions  Medication Sig Dispense Refill  . acyclovir (ZOVIRAX) 400 MG tablet Take 1 tablet (400 mg total) by mouth daily. (Dose adjusted for renal function) 60 tablet 3  . amLODipine (NORVASC) 10 MG tablet Take 1 tablet (10 mg total) by mouth daily. (Patient taking differently: Take 10 mg by mouth daily with lunch. ) 30 tablet 3  . amoxicillin-clavulanate (AUGMENTIN) 250-62.5 MG/5ML suspension Take 10 mLs (500 mg total) by mouth 2 (two) times daily. For 7 days. (dose adjusted for renal function) 150  mL 0  . b complex vitamins capsule Take 1 capsule by mouth daily. 60 capsule 1  . dexamethasone (DECADRON) 4 MG tablet Take 5 tablets (20 mg total) by mouth once a week. On Day 1,8,15 and 22. 30-94mins prior to cyclophosphamide 40 tablet 1  . furosemide (LASIX) 40 MG tablet Take 1 tablet (40 mg total) by mouth 2 (two) times daily. (Patient taking differently: Take 40 mg by mouth 3 (three) times daily. ) 90 tablet 1  . gabapentin (NEURONTIN) 250 MG/5ML solution Take 4 mLs (200 mg total) by mouth at bedtime. 120 mL 1  . hydrALAZINE (APRESOLINE) 50 MG tablet Take 1 tablet (50 mg total) by mouth 3 (three) times daily. 120 tablet 2  . LORazepam (ATIVAN) 1 MG tablet Take 1 tablet (1 mg total) by mouth every 8 (eight) hours as needed for anxiety (or nausea). 30 tablet 0  . metoprolol (LOPRESSOR) 100 MG tablet TAKE 1 TABLET BY MOUTH 2 TIMES A DAY 60 tablet 3  . prochlorperazine (COMPAZINE) 10 MG tablet Take 1 tablet (10 mg total) by mouth every 6 (six) hours as needed (Nausea or vomiting). 30 tablet 1  . vitamin E 100 UNIT capsule Take 100 Units by mouth every other day.     [name] azithromycin (ZITHROMAX) 200 MG/5ML suspension Take 12.5 mLs (500 mg total) by mouth daily. 62.5 mL 0  . ondansetron (ZOFRAN) 8 MG tablet Take 1 tablet (8 mg total) by mouth 2 (two) times daily as needed (Nausea or vomiting). (Patient not taking: Reported on 12/31/2014) 30 tablet 1   No current facility-administered medications for this visit.    REVIEW OF SYSTEMS:   Constitutional: Denies fevers, chills or abnormal night sweats Eyes: Denies blurriness of vision, double vision or watery eyes Ears, nose, mouth, throat, and face: Denies mucositis or sore throat Respiratory: Denies cough, dyspnea or wheezes Cardiovascular: Denies palpitation, chest discomfort or lower extremity swelling Gastrointestinal:  Denies nausea, heartburn or change in bowel habits Skin: Denies abnormal skin rashes Lymphatics: Denies new lymphadenopathy or  easy bruising Neurological:Denies numbness, tingling or new weaknesses Behavioral/Psych: Mood is stable, no new changes  All other systems were reviewed with the patient and are negative.   PHYSICAL EXAMINATION: ECOG PERFORMANCE STATUS: 2 - Symptomatic, <50% confined to bed  Filed Vitals:   12/31/14 0817  BP: 155/53  Pulse: 72  Temp: 98.1 F (36.7 C)  Resp: 20   Filed Weights   12/31/14 0817  Weight: 194 lb (87.998 kg)    GENERAL:alert, no distress , appears to be in good spirits and is smiling and quite verbal . SKIN: significant conjunctival pallor noted.  EYES: nl EOM, PERL OROPHARYNX:no exudate, no erythema and lips, buccal mucosa, and tongue normal  NECK: no JVD LYMPH:  no palpable lymphadenopathy in the cervical, axillary or inguinal LUNGS: clear to auscultation and percussion with normal breathing effort HEART: regular rate & rhythm and 2/6 SM aortic area ABDOMEN:abdomen soft, non-tender and normal bowel  sounds Musculoskeletal: B/l 1+ pedal edema no redness or tenderness  PSYCH: alert & oriented x 3 with fluent speech NEURO: no focal motor/sensory deficits  LABORATORY DATA:   I have reviewed the data as listed. CBC Latest Ref Rng 12/31/2014 12/24/2014 12/17/2014  WBC 4.0 - 10.3 10e3/uL 6.9 4.9 5.1  Hemoglobin 13.0 - 17.1 g/dL 8.9(L) 9.7(L) 10.2(L)  Hematocrit 38.4 - 49.9 % 26.9(L) 28.9(L) 30.4(L)  Platelets 140 - 400 10e3/uL 111(L) 125(L) 118(L)       Recent Labs  02/25/14 0959 03/02/14 1227 06/30/14 1528 08/18/14 1537 09/15/14 1539  10/04/14 1223  12/17/14 0826 12/24/14 0917 12/31/14 0755  NA 144 141 141 142 143  < > 145  < > 142 142 140  K 4.9 5.0 4.2 3.8 4.3  < > 5.0  < > 3.5 3.6 3.5  CL 115* 113* 109 110 108  --  111*  --   --   --   --   CO2 18* 18* 18* 19 16*  < > 21  < > 20* 17* 17*  GLUCOSE 95 92 94 120* 113*  < > 111*  < > 100 142* 119  BUN 27* 26* 30* 44* 36*  < > 38*  < > 41.8* 44.1* 46.1*  CREATININE 2.56* 2.38* 2.71* 2.97* 3.49*  < >  3.27*  < > 2.8* 2.8* 3.0*  CALCIUM 9.0 9.3 9.3 9.0 8.6  < > 9.6  < > 8.9 8.8 8.7  GFRNONAA 26* 28* 24*  --   --   --   --   --   --   --   --   GFRAA 30* 33* 28*  --   --   --   --   --   --   --   --   PROT 6.5 6.5 6.6  --   --   < >  --   < > 6.5 5.9* 6.0*  ALBUMIN 3.7 3.8 4.0 4.1 3.7  < >  --   < > 3.6 3.3* 3.1*  AST $Re'17 17 18  'kxQ$ --   --   < >  --   < > $R'22 15 28  'DD$ ALT $'10 11 11  'j$ --   --   < >  --   < > 39 24 58*  ALKPHOS 62 66 71  --   --   < >  --   < > 100 91 144  BILITOT 0.3 0.3 0.3  --   --   < >  --   < > 0.53 0.46 0.37  < > = values in this interval not displayed. . Lab Results  Component Value Date   TOTALPROTELP 5.7* 12/10/2014   ALBUMINELP 3.3* 12/10/2014   A1GS 0.5* 12/10/2014   A2GS 1.0* 12/10/2014   BETS 0.4 12/10/2014   BETA2SER 0.2 12/10/2014   GAMS 0.4* 12/10/2014   SPEI * 12/10/2014   (this displays SPEP labs)  Lab Results  Component Value Date   KPAFRELGTCHN 3.40* 12/10/2014   LAMBDASER 2.68* 12/10/2014   KAPLAMBRATIO 1.27 12/10/2014   (kappa/lambda light chains)  BONE MARROW BIOPSY RESULTS 09/28/2014:   RADIOGRAPHIC STUDIES: I have personally reviewed the radiological images as listed and agreed with the findings in the report. No results found. Echocardiogram 10/07/2014  Study Conclusions  - Left ventricle: The cavity size was normal. Wall thickness was increased in a pattern of moderate LVH. There was mild focal basal hypertrophy of the septum. Systolic function was  normal. The estimated ejection fraction was in the range of 55% to 60%. Wall motion was normal; there were no regional wall motion abnormalities. Features are consistent with a pseudonormal left ventricular filling pattern, with concomitant abnormal relaxation and increased filling pressure (grade 2 diastolic dysfunction). - Aortic valve: There was very mild stenosis. There was mild regurgitation. Valve area (VTI): 2.67 cm^2. Valve area (Vmax): 2.49 cm^2. Valve area  (Vmean): 2.3 cm^2. - Mitral valve: There was mild regurgitation.  Impressions:  - Normal LV function; grade 2 diastolic dysfunction; mild to moderate LVH with proximal septal thickening; calcified aortic valve with very mild AS (mean gradient 10 mmHg) and mild AI; mild MR; trace TR. Global longitudinal strain - 17%.   ASSESSMENT & PLAN:    Mr Jose Garrett is very pleasant 64 yo caucasian male with   1) AL Amyloidosis noted on recent renal biopsy with nephrotic range proteinuria on urinalysis and some lambda free light chains on serum and urinary IFE . On diagnosis:  SPEP with small amount of M-spike 0.3  Echo showed no systolic dysfunction but grade 2 diastolic dysfunction. [facility] cardiac staging 1 as per troponin and BNP criteria. Bone marrow with no overt evidence of multiple myeloma. PET/CT scan showed no evidence of bony lesions or lymphadenopathy . Urine showed about 15 g of protein in 24 hours   After 1 month of treatment the M-spike is down to 0.1gm/dl and urinary total protein is down to about 7.6g/24hours  After 2 months of treatment: M-spike in 0.1g/dl lambda free light chains improved and urinary total protein down to about 4 g per 24 hours With no monoclonal free light chains in the urine.  2) multifactorial anemia - likely multifactorial -from AL amyloidosis, anemia from CKD, acute blood loss anemia due to large perinephric hematoma related to recent renal bx on 09/08/2014. Hematoma noted to be stable on rpt US renal on 09/17/2014. 3) CKD -related to hypertension and AL Amyloidosis. Creatinine is 2.8 today and is improved from his previous high of 3.5. Decreasing proteinuria 4) Feet tingling and numbness ? Related to AL Amyloidosis vs Velcade vs B12 deficiency stable. Started taking gabapentin 12/16/2014. 5)Bilateral pedal edema right more than left about the same as last time despite increasing his Lasix and decreased dexamethasone. Ultrasound of bilateral lower extremity  is negative for DVT. 6) acute bacterial sinusitis.  Plan -Ultrasound lower extremities venous Doppler done today showed no evidence of DVT. -Continue current dose of Lasix with close monitoring of daily weights. -Recommended using Jobst stockings. -Continue darbepoetin 40 g every 28 days -next dose today -Continue subQ  B12 daily 28 days  -cyclophosphamide to 500 mg weekly and continue Velcade and dexamethasone (20mg  qweekly). -Patient will get CBC, CMP weekly and myeloma labs every monthly. Myeloma labs due next week. -Given prescription for liquid Augmentin for his acute sinusitis. This has not been available in several pharmacies and therefore he was also given a prescription for Azithromycin liquid and recommended to take either of the antibiotics that were available. -continue on low renally dosed neurontin 200mg  po HS for neuropathy.  -Monitor blood pressure with darbepoetin. -Discussed with patient regarding his input about autologous stem cell transplant referra he is still not comfortable with this idea. Would like some more time to dwell on this. -Continue close f/u with PCP and nephrology.  Return to care with Dr. [name] in 2 weeks. Continue weekly labs. Myeloma markers prior to next cycle including SPEP, UPEP, And lambda light chains, urine total  protein/creatinine ratio.  I appreciate the privilege of being a part of the medical care of this fantastic gentleman.  All questions were answered. The patient knows to call the clinic with any problems, questions or concerns. I spent 20 minutes counseling the patient face to face. The total time spent in the appointment was 25 minutes and more than 50% was on counseling.   Sullivan Lone MD [place] Hematology/Oncology Physician [facility]  (Office):       [phone] (Work cell):  [phone] (Fax):           [phone]

## 2014-12-31 NOTE — Progress Notes (Signed)
Ok to treat with Creatinine of 3.0 per Dr. [name].

## 2015-01-03 MED ORDER — AMLODIPINE BESYLATE 10 MG PO TABS: 10.0000 mg | ORAL_TABLET | Freq: Every day | ORAL | Status: DC

## 2015-01-03 NOTE — Telephone Encounter (Signed)
Rx send to [facility] Pt  pharmacy  Pt aware

## 2015-01-07 ENCOUNTER — Ambulatory Visit (HOSPITAL_BASED_OUTPATIENT_CLINIC_OR_DEPARTMENT_OTHER): Payer: Self-pay

## 2015-01-07 VITALS — BP 141/53 | HR 58 | Temp 98.3°F | Resp 20

## 2015-01-07 DIAGNOSIS — E8581 Light chain (AL) amyloidosis: Secondary | ICD-10-CM

## 2015-01-07 DIAGNOSIS — Z5111 Encounter for antineoplastic chemotherapy: Secondary | ICD-10-CM

## 2015-01-07 DIAGNOSIS — E858 Other amyloidosis: Secondary | ICD-10-CM

## 2015-01-07 DIAGNOSIS — Z5112 Encounter for antineoplastic immunotherapy: Secondary | ICD-10-CM

## 2015-01-07 LAB — COMPREHENSIVE METABOLIC PANEL (CC13)
ALBUMIN: 3.1 g/dL — AB (ref 3.5–5.0)
ALK PHOS: 95 U/L (ref 40–150)
ALT: 23 U/L (ref 0–55)
AST: 14 U/L (ref 5–34)
Anion Gap: 10 mEq/L (ref 3–11)
BUN: 39.6 mg/dL — AB (ref 7.0–26.0)
CALCIUM: 8.8 mg/dL (ref 8.4–10.4)
CHLORIDE: 114 meq/L — AB (ref 98–109)
CO2: 18 mEq/L — ABNORMAL LOW (ref 22–29)
CREATININE: 2.6 mg/dL — AB (ref 0.7–1.3)
EGFR: 26 mL/min/{1.73_m2} — ABNORMAL LOW (ref >90)
Glucose: 157 mg/dl — ABNORMAL HIGH (ref 70–140)
Potassium: 3.4 mEq/L — ABNORMAL LOW (ref 3.5–5.1)
Sodium: 142 mEq/L (ref 136–145)
Total Bilirubin: 0.34 mg/dL (ref 0.20–1.20)
Total Protein: 5.7 g/dL — ABNORMAL LOW (ref 6.4–8.3)

## 2015-01-07 LAB — CBC WITH DIFFERENTIAL/PLATELET
BASO%: 0.7 % (ref 0.0–2.0)
Basophils Absolute: 0 10*3/uL (ref 0.0–0.1)
EOS%: 3.3 % (ref 0.0–7.0)
Eosinophils Absolute: 0.2 10*3/uL (ref 0.0–0.5)
HEMATOCRIT: 28.4 % — AB (ref 38.4–49.9)
HEMOGLOBIN: 9.4 g/dL — AB (ref 13.0–17.1)
LYMPH#: 1.2 10*3/uL (ref 0.9–3.3)
LYMPH%: 22.3 % (ref 14.0–49.0)
MCH: 30.2 pg (ref 27.2–33.4)
MCHC: 33 g/dL (ref 32.0–36.0)
MCV: 91.5 fL (ref 79.3–98.0)
MONO#: 0.3 10*3/uL (ref 0.1–0.9)
MONO%: 5.5 % (ref 0.0–14.0)
NEUT%: 68.2 % (ref 39.0–75.0)
NEUTROS ABS: 3.6 10*3/uL (ref 1.5–6.5)
Platelets: 146 10*3/uL (ref 140–400)
RBC: 3.1 10*6/uL — ABNORMAL LOW (ref 4.20–5.82)
RDW: 16.2 % — AB (ref 11.0–14.6)
WBC: 5.2 10*3/uL (ref 4.0–10.3)

## 2015-01-07 LAB — TECHNOLOGIST REVIEW

## 2015-01-07 MED ORDER — SODIUM CHLORIDE 0.9 % IV SOLN
500.0000 mg | Freq: Once | INTRAVENOUS | Status: AC
  Administered 2015-01-07: 500 mg via INTRAVENOUS
  Filled 2015-01-07: qty 25

## 2015-01-07 MED ORDER — SODIUM CHLORIDE 0.9 % IV SOLN
Freq: Once | INTRAVENOUS | Status: AC
  Administered 2015-01-07: 09:00:00 via INTRAVENOUS
  Filled 2015-01-07: qty 4

## 2015-01-07 MED ORDER — SODIUM CHLORIDE 0.9 % IV SOLN
Freq: Once | INTRAVENOUS | Status: AC
  Administered 2015-01-07: 09:00:00 via INTRAVENOUS

## 2015-01-07 MED ORDER — BORTEZOMIB CHEMO SQ INJECTION 3.5 MG (2.5MG/ML)
1.3000 mg/m2 | Freq: Once | INTRAMUSCULAR | Status: AC
  Administered 2015-01-07: 2.5 mg via SUBCUTANEOUS
  Filled 2015-01-07: qty 2.5

## 2015-01-07 NOTE — Patient Instructions (Signed)
[place] Discharge Instructions for Patients Receiving Chemotherapy  Today you received the following chemotherapy agents Cytoxan/Velcade  To help prevent nausea and vomiting after your treatment, we encourage you to take your nausea medication     If you develop nausea and vomiting that is not controlled by your nausea medication, call the clinic.   BELOW ARE SYMPTOMS THAT SHOULD BE REPORTED IMMEDIATELY:  *FEVER GREATER THAN 100.5 F  *CHILLS WITH OR WITHOUT FEVER  NAUSEA AND VOMITING THAT IS NOT CONTROLLED WITH YOUR NAUSEA MEDICATION  *UNUSUAL SHORTNESS OF BREATH  *UNUSUAL BRUISING OR BLEEDING  TENDERNESS IN MOUTH AND THROAT WITH OR WITHOUT PRESENCE OF ULCERS  *URINARY PROBLEMS  *BOWEL PROBLEMS  UNUSUAL RASH Items with * indicate a potential emergency and should be followed up as soon as possible.  Feel free to call the clinic you have any questions or concerns. The clinic phone number is [phone].  Please show the [place] at check-in to the Emergency Department and triage nurse.

## 2015-01-10 ENCOUNTER — Telehealth: Payer: Self-pay | Admitting: *Deleted

## 2015-01-10 NOTE — Telephone Encounter (Signed)
VM message received from patient @ 8:42 am requesting call back d/t allergies or cold symptoms.  TC to patient and he states that he was treated 2 weeks ago for sinus infection. He states he felt a little better after that but now has developed nasal congestion, post nasal drip and runny nose. Denies fever. States he has a cough because of post nasal drip. He is seeking advice for OTC meds. Advised to use Robitussin for cough, claritin for runny nose or to try Flonase for nasal congestion. Also advised to call back if secretions changed from clear to yellow or green or if develops a fever. Patient voiced understanding.

## 2015-01-11 LAB — SPEP & IFE WITH QIG
ABNORMAL PROTEIN BAND1: 0.1 g/dL
ALBUMIN ELP: 3.4 g/dL — AB (ref 3.8–4.8)
ALPHA-1-GLOBULIN: 0.4 g/dL — AB (ref 0.2–0.3)
ALPHA-2-GLOBULIN: 0.9 g/dL (ref 0.5–0.9)
Beta 2: 0.2 g/dL (ref 0.2–0.5)
Beta Globulin: 0.3 g/dL — ABNORMAL LOW (ref 0.4–0.6)
GAMMA GLOBULIN: 0.3 g/dL — AB (ref 0.8–1.7)
IGG (IMMUNOGLOBIN G), SERUM: 311 mg/dL — AB (ref 650–1600)
IgA: 53 mg/dL — ABNORMAL LOW (ref 68–379)
IgM, Serum: 75 mg/dL (ref 41–251)
TOTAL PROTEIN, SERUM ELECTROPHOR: 5.5 g/dL — AB (ref 6.1–8.1)

## 2015-01-11 LAB — KAPPA/LAMBDA LIGHT CHAINS
Kappa free light chain: 3.52 mg/dL — ABNORMAL HIGH (ref 0.33–1.94)
Kappa:Lambda Ratio: 1.54 (ref 0.26–1.65)
Lambda Free Lght Chn: 2.29 mg/dL (ref 0.57–2.63)

## 2015-01-14 ENCOUNTER — Other Ambulatory Visit (HOSPITAL_BASED_OUTPATIENT_CLINIC_OR_DEPARTMENT_OTHER): Payer: Self-pay

## 2015-01-14 ENCOUNTER — Telehealth: Payer: Self-pay | Admitting: *Deleted

## 2015-01-14 ENCOUNTER — Telehealth: Payer: Self-pay | Admitting: Hematology

## 2015-01-14 ENCOUNTER — Ambulatory Visit (HOSPITAL_BASED_OUTPATIENT_CLINIC_OR_DEPARTMENT_OTHER): Payer: Self-pay | Admitting: Hematology

## 2015-01-14 ENCOUNTER — Ambulatory Visit (HOSPITAL_BASED_OUTPATIENT_CLINIC_OR_DEPARTMENT_OTHER): Payer: Self-pay

## 2015-01-14 ENCOUNTER — Encounter: Payer: Self-pay | Admitting: Hematology

## 2015-01-14 VITALS — BP 148/54 | HR 64 | Temp 97.7°F | Resp 18 | Ht 68.0 in | Wt 193.9 lb

## 2015-01-14 DIAGNOSIS — D649 Anemia, unspecified: Secondary | ICD-10-CM

## 2015-01-14 DIAGNOSIS — E858 Other amyloidosis: Secondary | ICD-10-CM

## 2015-01-14 DIAGNOSIS — I129 Hypertensive chronic kidney disease with stage 1 through stage 4 chronic kidney disease, or unspecified chronic kidney disease: Secondary | ICD-10-CM

## 2015-01-14 DIAGNOSIS — M7989 Other specified soft tissue disorders: Secondary | ICD-10-CM

## 2015-01-14 DIAGNOSIS — E8581 Light chain (AL) amyloidosis: Secondary | ICD-10-CM

## 2015-01-14 DIAGNOSIS — R600 Localized edema: Secondary | ICD-10-CM

## 2015-01-14 DIAGNOSIS — J309 Allergic rhinitis, unspecified: Secondary | ICD-10-CM

## 2015-01-14 DIAGNOSIS — Z5111 Encounter for antineoplastic chemotherapy: Secondary | ICD-10-CM

## 2015-01-14 DIAGNOSIS — D638 Anemia in other chronic diseases classified elsewhere: Secondary | ICD-10-CM

## 2015-01-14 DIAGNOSIS — R200 Anesthesia of skin: Secondary | ICD-10-CM

## 2015-01-14 DIAGNOSIS — Z5112 Encounter for antineoplastic immunotherapy: Secondary | ICD-10-CM

## 2015-01-14 DIAGNOSIS — G629 Polyneuropathy, unspecified: Secondary | ICD-10-CM

## 2015-01-14 LAB — UPEP/TP, 24-HR URINE
ALPHA-2-GLOBULIN, U: 5.5 %
Albumin: 75.3 %
Alpha-1-Globulin, U: 10.1 %
BETA GLOBULIN, U: 7 %
Collection Interval: 24 hours
GAMMA GLOBULIN, U: 2.1 %
TOTAL PROTEIN, URINE: 314 mg/dL
TOTAL VOLUME, URINE: 2300 mL
Total Protein, Urine/Day: 7222 mg/d — ABNORMAL HIGH (ref 50–100)

## 2015-01-14 LAB — CBC WITH DIFFERENTIAL/PLATELET
BASO%: 0.7 % (ref 0.0–2.0)
BASOS ABS: 0 10*3/uL (ref 0.0–0.1)
EOS ABS: 0.2 10*3/uL (ref 0.0–0.5)
EOS%: 4.1 % (ref 0.0–7.0)
HCT: 29.6 % — ABNORMAL LOW (ref 38.4–49.9)
HGB: 9.8 g/dL — ABNORMAL LOW (ref 13.0–17.1)
LYMPH%: 19.9 % (ref 14.0–49.0)
MCH: 30.2 pg (ref 27.2–33.4)
MCHC: 33 g/dL (ref 32.0–36.0)
MCV: 91.7 fL (ref 79.3–98.0)
MONO#: 0.5 10*3/uL (ref 0.1–0.9)
MONO%: 11.6 % (ref 0.0–14.0)
NEUT%: 63.7 % (ref 39.0–75.0)
NEUTROS ABS: 2.6 10*3/uL (ref 1.5–6.5)
PLATELETS: 116 10*3/uL — AB (ref 140–400)
RBC: 3.23 10*6/uL — AB (ref 4.20–5.82)
RDW: 16.5 % — ABNORMAL HIGH (ref 11.0–14.6)
WBC: 4.1 10*3/uL (ref 4.0–10.3)
lymph#: 0.8 10*3/uL — ABNORMAL LOW (ref 0.9–3.3)

## 2015-01-14 LAB — COMPREHENSIVE METABOLIC PANEL (CC13)
ALK PHOS: 93 U/L (ref 40–150)
ALT: 21 U/L (ref 0–55)
ANION GAP: 10 meq/L (ref 3–11)
AST: 19 U/L (ref 5–34)
Albumin: 3.2 g/dL — ABNORMAL LOW (ref 3.5–5.0)
BILIRUBIN TOTAL: 0.34 mg/dL (ref 0.20–1.20)
BUN: 36.8 mg/dL — ABNORMAL HIGH (ref 7.0–26.0)
CO2: 17 meq/L — AB (ref 22–29)
Calcium: 9 mg/dL (ref 8.4–10.4)
Chloride: 114 mEq/L — ABNORMAL HIGH (ref 98–109)
Creatinine: 2.8 mg/dL — ABNORMAL HIGH (ref 0.7–1.3)
EGFR: 24 mL/min/{1.73_m2} — AB (ref >90)
Glucose: 121 mg/dl (ref 70–140)
Potassium: 3.7 mEq/L (ref 3.5–5.1)
Sodium: 142 mEq/L (ref 136–145)
TOTAL PROTEIN: 5.9 g/dL — AB (ref 6.4–8.3)

## 2015-01-14 LAB — 24 HR URINE,KAPPA/LAMBDA LIGHT CHAINS
MEASURED KAPPA CHAIN: 3.04 mg/dL — AB (ref <2.00)
Measured Lambda Chain: 2.42 mg/dL — ABNORMAL HIGH (ref <2.00)
Total Kappa Chain: 69.92 mg/24 h
Total Lambda Chain: 55.66 mg/24 h
URINE VOLUME: 2300 mL/(24.h)

## 2015-01-14 LAB — UIFE/LIGHT CHAINS/TP QN, 24-HR UR
ALPHA 2 UR: DETECTED — AB
Albumin, U: DETECTED
Alpha 1, Urine: DETECTED — AB
Beta, Urine: DETECTED — AB
Gamma Globulin, Urine: DETECTED — AB
TIME-UPE24: 24 h
TOTAL PROTEIN, URINE-UPE24: 319 mg/dL — AB (ref 5–25)
TOTAL PROTEIN, URINE-UR/DAY: 7337 mg/d — AB (ref <150)
VOLUME, URINE-UPE24: 2300 mL

## 2015-01-14 MED ORDER — SODIUM CHLORIDE 0.9 % IV SOLN
500.0000 mg | Freq: Once | INTRAVENOUS | Status: AC
  Administered 2015-01-14: 500 mg via INTRAVENOUS
  Filled 2015-01-14: qty 25

## 2015-01-14 MED ORDER — BORTEZOMIB CHEMO SQ INJECTION 3.5 MG (2.5MG/ML)
1.3000 mg/m2 | Freq: Once | INTRAMUSCULAR | Status: AC
  Administered 2015-01-14: 2.5 mg via SUBCUTANEOUS
  Filled 2015-01-14: qty 2.5

## 2015-01-14 MED ORDER — SODIUM CHLORIDE 0.9 % IV SOLN
Freq: Once | INTRAVENOUS | Status: AC
  Administered 2015-01-14: 10:00:00 via INTRAVENOUS
  Filled 2015-01-14: qty 4

## 2015-01-14 MED ORDER — FLONASE 50 MCG/ACT NA SUSP: 1.0000 | Freq: Every day | NASAL | Status: DC

## 2015-01-14 MED ORDER — ASPIRIN EC 81 MG PO TBEC: 81.0000 mg | DELAYED_RELEASE_TABLET | Freq: Every day | ORAL | Status: DC

## 2015-01-14 MED ORDER — SODIUM CHLORIDE 0.9 % IV SOLN
Freq: Once | INTRAVENOUS | Status: AC
  Administered 2015-01-14: 10:00:00 via INTRAVENOUS

## 2015-01-14 NOTE — Telephone Encounter (Signed)
per pof to sch pt appt-sent MW emailto sch trmt-pt to getupdated copy b4 leaving** °

## 2015-01-14 NOTE — Progress Notes (Signed)
Per Dr. [name], okay to tx with creatinine 2.8

## 2015-01-14 NOTE — Telephone Encounter (Signed)
Per staff message and POF I have scheduled appts. Advised scheduler of appts. JMW  

## 2015-01-14 NOTE — Patient Instructions (Signed)
[facility] Discharge Instructions for Patients Receiving Chemotherapy  Today you received the following chemotherapy agents Cytoxan/Velcade  To help prevent nausea and vomiting after your treatment, we encourage you to take your nausea medication     If you develop nausea and vomiting that is not controlled by your nausea medication, call the clinic.   BELOW ARE SYMPTOMS THAT SHOULD BE REPORTED IMMEDIATELY:  *FEVER GREATER THAN 100.5 F  *CHILLS WITH OR WITHOUT FEVER  NAUSEA AND VOMITING THAT IS NOT CONTROLLED WITH YOUR NAUSEA MEDICATION  *UNUSUAL SHORTNESS OF BREATH  *UNUSUAL BRUISING OR BLEEDING  TENDERNESS IN MOUTH AND THROAT WITH OR WITHOUT PRESENCE OF ULCERS  *URINARY PROBLEMS  *BOWEL PROBLEMS  UNUSUAL RASH Items with * indicate a potential emergency and should be followed up as soon as possible.  Feel free to call the clinic you have any questions or concerns. The clinic phone number is [phone].  Please show the [place] at check-in to the Emergency Department and triage nurse.

## 2015-01-14 NOTE — Progress Notes (Signed)
Pt instructed to  Take one BABY ASA tablet once a day per Dr. [name]. Pt verbalized    Understanding of directions.

## 2015-01-15 NOTE — Progress Notes (Signed)
.    Hematology oncology clinic note  Date of service .01/14/2015   Patient Care Team: [name], MD as PCP - General (Family Medicine) [name], MD as PCP - Hematology/Oncology (Hematology and Oncology) [name], MD as Consulting Physician (Nephrology)  CHIEF COMPLAINTS: followup for AL amyloidosis.  HISTORY OF PRESENTING ILLNESS: Please see my initial consultation for details on presentation.  INTERVAL HISTORY  Jose Garrett is here for his scheduled 2 week follow-up. He notes that the Neurontin is significantly helping his neuropathy. Still having bilateral leg swelling right more than left. Recently had an ultrasound of his legs that did not show any DVT. Has not been using compression socks as previously recommended. Given a prescription for Jobst stockings. Also started on baby aspirin to help reduce the risk of DVTs given his nephrotic syndrome. Overall feels quite well. Is sleeping better. We discussed the fact that he appears to have had renal response with decrease of his urinary protein to less than 50% of pretreatment levels and decrease of his free light chains as well as. He notes that he has an upcoming follow-up with his nephrologist. We discussed the option of being evaluated for autologous transplantation and he was agreeable but I am told that does not have insurance. We have discussed this with the social worker who was looking into this matter. No other acute treatment toxicities. Acute sinusitis resolved but still having allergic sinus congestion. No fevers or chills. No acute rashes.  MEDICAL HISTORY:  Past Medical History  Diagnosis Date  . Hypertension Dx 2012  . Hyperlipidemia Dx 2012  . History of blood transfusion 09/08/2014    "got hematoma after renal biopsy & HgB dropped"  . Anemia Dx 2016  . Family history of adverse reaction to anesthesia     "my daughter can't take certain anesthesia agents" (09/08/2014)  . Enlarged heart  2015  . Chronic diastolic CHF (congestive heart failure), NYHA class 1 ([place])     /hospital problem list 09/08/2014  . Renal disorder     "there is a spot on my kidney" (09/08/2014)  . Chronic renal disease, stage III     /hospital problem list 09/08/2014    SURGICAL HISTORY: Past Surgical History  Procedure Laterality Date  . Renal biopsy, percutaneous Right 09/08/2014  . Tonsillectomy  ~ 1960  . Laparoscopic cholecystectomy  03/2011    SOCIAL HISTORY: Social History   Social History  . Marital Status: Divorced    Spouse Name: N/A  . Number of Children: N/A  . Years of Education: N/A   Occupational History  . Not on file.   Social History Main Topics  . Smoking status: Former Smoker -- 2.00 packs/day for 40 years    Types: Cigarettes    Quit date: 03/19/2011  . Smokeless tobacco: Never Used  . Alcohol Use: No  . Drug Use: No     Comment: 09/08/2014 "I have used marijuana till the 1990's". Notes that he quit 15 yrs ago  . Sexual Activity: Not Currently   Other Topics Concern  . 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 Lives with his daughter and her finance.  FAMILY HISTORY: Family History  Problem Relation Age of Onset  . Hypertension Mother   . Diabetes Mother   . Heart disease Mother   . Skin cancer Mother   . Hypertension Father   . Diabetes Father   . Diabetes  Sister     ALLERGIES:  has No Known Allergies.  MEDICATIONS:  Current Outpatient Prescriptions  Medication Sig Dispense Refill  . acyclovir (ZOVIRAX) 400 MG tablet Take 1 tablet (400 mg total) by mouth daily. (Dose adjusted for renal function) 60 tablet 3  . amLODipine (NORVASC) 10 MG tablet Take 1 tablet (10 mg total) by mouth daily. 30 tablet 3  . aspirin EC 81 MG tablet Take 1 tablet (81 mg total) by mouth daily. 30 tablet 11  . b complex vitamins capsule Take 1 capsule by mouth daily. 60 capsule 1  . dexamethasone  (DECADRON) 4 MG tablet Take 5 tablets (20 mg total) by mouth once a week. On Day 1,8,15 and 22. 30-37mins prior to cyclophosphamide 40 tablet 1  . FLONASE 50 MCG/ACT nasal spray Place 1 spray into both nostrils daily. 15.8 g 0  . furosemide (LASIX) 40 MG tablet Take 1 tablet (40 mg total) by mouth 2 (two) times daily. (Patient taking differently: Take 40 mg by mouth 3 (three) times daily. ) 90 tablet 1  . gabapentin (NEURONTIN) 250 MG/5ML solution Take 4 mLs (200 mg total) by mouth at bedtime. 120 mL 1  . hydrALAZINE (APRESOLINE) 50 MG tablet Take 1 tablet (50 mg total) by mouth 3 (three) times daily. 120 tablet 2  . LORazepam (ATIVAN) 1 MG tablet Take 1 tablet (1 mg total) by mouth every 8 (eight) hours as needed for anxiety (or nausea). 30 tablet 0  . metoprolol (LOPRESSOR) 100 MG tablet TAKE 1 TABLET BY MOUTH 2 TIMES A DAY 60 tablet 3  . ondansetron (ZOFRAN) 8 MG tablet Take 1 tablet (8 mg total) by mouth 2 (two) times daily as needed (Nausea or vomiting). (Patient not taking: Reported on 12/31/2014) 30 tablet 1  . prochlorperazine (COMPAZINE) 10 MG tablet Take 1 tablet (10 mg total) by mouth every 6 (six) hours as needed (Nausea or vomiting). 30 tablet 1  . vitamin E 100 UNIT capsule Take 100 Units by mouth every other day.      No current facility-administered medications for this visit.    REVIEW OF SYSTEMS:   Constitutional: Denies fevers, chills or abnormal night sweats Eyes: Denies blurriness of vision, double vision or watery eyes Ears, nose, mouth, throat, and face: Denies mucositis or sore throat Respiratory: Denies cough, dyspnea or wheezes Cardiovascular: Denies palpitation, chest discomfort or lower extremity swelling Gastrointestinal:  Denies nausea, heartburn or change in bowel habits Skin: Denies abnormal skin rashes Lymphatics: Denies new lymphadenopathy or easy bruising Neurological:Denies numbness, tingling or new weaknesses Behavioral/Psych: Mood is stable, no new  changes  All other systems were reviewed with the patient and are negative.   PHYSICAL EXAMINATION: ECOG PERFORMANCE STATUS: 2 - Symptomatic, <50% confined to bed  Filed Vitals:   01/14/15 0826  BP: 148/54  Pulse: 64  Temp: 97.7 F (36.5 C)  Resp: 18   Filed Weights   01/14/15 0826  Weight: 193 lb 14.4 oz (87.952 kg)    GENERAL:alert, no distress , appears to be in good spirits and is smiling and quite verbal . SKIN: significant conjunctival pallor noted.  EYES: nl EOM, PERL OROPHARYNX:no exudate, no erythema and lips, buccal mucosa, and tongue normal  NECK: no JVD LYMPH:  no palpable lymphadenopathy in the cervical, axillary or inguinal LUNGS: clear to auscultation and percussion with normal breathing effort HEART: regular rate & rhythm and 2/6 SM aortic area ABDOMEN:abdomen soft, non-tender and normal bowel sounds Musculoskeletal: B/l 1+ pitting pedal edema in  the left leg and 2+ pedal edema on the right leg. No Tenderness. No redness.  PSYCH: alert & oriented x 3 with fluent speech NEURO: no focal motor/sensory deficits  LABORATORY DATA:   I have reviewed the data as listed. CBC Latest Ref Rng 01/14/2015 01/07/2015 12/31/2014  WBC 4.0 - 10.3 10e3/uL 4.1 5.2 6.9  Hemoglobin 13.0 - 17.1 g/dL 9.8(L) 9.4(L) 8.9(L)  Hematocrit 38.4 - 49.9 % 29.6(L) 28.4(L) 26.9(L)  Platelets 140 - 400 10e3/uL 116(L) 146 111(L)       Recent Labs  02/25/14 0959 03/02/14 1227 06/30/14 1528 08/18/14 1537 09/15/14 1539  10/04/14 1223  12/31/14 0755 01/07/15 0807 01/14/15 0815  NA 144 141 141 142 143  < > 145  < > 140 142 142  K 4.9 5.0 4.2 3.8 4.3  < > 5.0  < > 3.5 3.4* 3.7  CL 115* 113* 109 110 108  --  111*  --   --   --   --   CO2 18* 18* 18* 19 16*  < > 21  < > 17* 18* 17*  GLUCOSE 95 92 94 120* 113*  < > 111*  < > 119 157* 121  BUN 27* 26* 30* 44* 36*  < > 38*  < > 46.1* 39.6* 36.8*  CREATININE 2.56* 2.38* 2.71* 2.97* 3.49*  < > 3.27*  < > 3.0* 2.6* 2.8*  CALCIUM 9.0 9.3  9.3 9.0 8.6  < > 9.6  < > 8.7 8.8 9.0  GFRNONAA 26* 28* 24*  --   --   --   --   --   --   --   --   GFRAA 30* 33* 28*  --   --   --   --   --   --   --   --   PROT 6.5 6.5 6.6  --   --   < >  --   < > 6.0* 5.7* 5.9*  ALBUMIN 3.7 3.8 4.0 4.1 3.7  < >  --   < > 3.1* 3.1* 3.2*  AST $Re'17 17 18  'Vnp$ --   --   < >  --   < > $R'28 14 19  'Cg$ ALT $'10 11 11  'c$ --   --   < >  --   < > 58* 23 21  ALKPHOS 62 66 71  --   --   < >  --   < > 144 95 93  BILITOT 0.3 0.3 0.3  --   --   < >  --   < > 0.37 0.34 0.34  < > = values in this interval not displayed. . Lab Results  Component Value Date   TOTALPROTELP 5.5* 01/07/2015   ALBUMINELP 3.4* 01/07/2015   A1GS 0.4* 01/07/2015   A2GS 0.9 01/07/2015   BETS 0.3* 01/07/2015   BETA2SER 0.2 01/07/2015   GAMS 0.3* 01/07/2015   SPEI * 01/07/2015   (this displays SPEP labs)  Lab Results  Component Value Date   KPAFRELGTCHN 3.52* 01/07/2015   LAMBDASER 2.29 01/07/2015   KAPLAMBRATIO 1.54 01/07/2015   (kappa/lambda light chains)  BONE MARROW BIOPSY RESULTS 09/28/2014:   RADIOGRAPHIC STUDIES: I have personally reviewed the radiological images as listed and agreed with the findings in the report. No results found. Echocardiogram 10/07/2014  Study Conclusions  - Left ventricle: The cavity size was normal. Wall thickness was increased in a pattern of moderate LVH. There was mild focal basal hypertrophy of  the septum. Systolic function was normal. The estimated ejection fraction was in the range of 55% to 60%. Wall motion was normal; there were no regional wall motion abnormalities. Features are consistent with a pseudonormal left ventricular filling pattern, with concomitant abnormal relaxation and increased filling pressure (grade 2 diastolic dysfunction). - Aortic valve: There was very mild stenosis. There was mild regurgitation. Valve area (VTI): 2.67 cm^2. Valve area (Vmax): 2.49 cm^2. Valve area (Vmean): 2.3 cm^2. - Mitral valve: There was  mild regurgitation.  Impressions:  - Normal LV function; grade 2 diastolic dysfunction; mild to moderate LVH with proximal septal thickening; calcified aortic valve with very mild AS (mean gradient 10 mmHg) and mild AI; mild MR; trace TR. Global longitudinal strain - 17%.   ASSESSMENT & PLAN:    Mr Jose Garrett is very pleasant 64 yo caucasian male with   1) AL Amyloidosis noted on recent renal biopsy with nephrotic range proteinuria on urinalysis and some lambda free light chains on serum and urinary IFE . On diagnosis:  SPEP with small amount of M-spike 0.3  Echo showed no systolic dysfunction but grade 2 diastolic dysfunction. [facility] cardiac staging 1 as per troponin and BNP criteria. Bone marrow with no overt evidence of multiple myeloma. PET/CT scan showed no evidence of bony lesions or lymphadenopathy . Urine showed about 15 g of protein in 24 hours   After 1 month of treatment the M-spike is down to 0.1gm/dl and urinary total protein is down to about 7.6g/24hours  After 2 months of treatment: M-spike in 0.1g/dl lambda free light chains improved and urinary total protein down to about 4 g per 24 hours With no monoclonal free light chains in the urine.  After 3 months of treatment: M spike still 0.1g/dl, difference in serum free light chains less than 4, urine protein down to 7222 mg per 24 hours which is less than 50% of the initial suggesting good renal response. Overall patient appears to also have partial to very good partial response based on his systemic markers of AL amyloidosis. Plan -We'll continue current treatment if tolerated up to maximum response usually up to 6 months. -We discussed with him about giving him a referral to consider autologous hematopoietic stem cell transplant. He is not really convinced. His renal function might also disqualify him. At this point as per the social worker he does not appear to have any insurance that would allow [place] to refer him  to [facility] for this evaluation. The social worker will look into this further. -Continue follow-up with nephrology to determine if any other treatments might help with his proteinuria as well... ACE inhibitor/ARB might be tricky given his renal function. ? Role for calcium channel blocker/spironolactone. We'll defer to nephrology.  2) multifactorial anemia - likely multifactorial -from AL amyloidosis, anemia from CKD, acute blood loss anemia due to large perinephric hematoma related to recent renal bx on 09/08/2014.  Stable with darbepoetin. 3) CKD -related to hypertension and AL Amyloidosis. Creatinine is 2.8 today and is improved from his previous high of 3.5. Decreasing proteinuria 4) Feet tingling and numbness ? Related to AL Amyloidosis vs Velcade vs B12 deficiency stable. Started taking gabapentin 12/16/2014. 5)Bilateral pedal edema right more than left about the same as last time despite increasing his Lasix and decreased dexamethasone. Ultrasound of bilateral lower extremity is negative for DVT. 6) acute bacterial sinusitis.-Resolved with allergic sinusitis.  Plan -Recommended leg elevation. -Given prescription for Jobst stockings -Continue current dose of Lasix with  close monitoring of daily weights. -Continue darbepoetin 40 g every 28 days -next dose today -Continue subQ  B12 daily 28 days  -cyclophosphamide to 500 mg weekly and continue Velcade and dexamethasone (20mg  qweekly). -Patient will get CBC, CMP weekly and myeloma labs every monthly.  -Referral to [facility] for autologous stem cell transplant evaluation if allowed by insurance. -continue on low renally dosed neurontin 200mg  po HS for neuropathy.  -Monitor blood pressure with darbepoetin. -Flonase given for allergic sinusitis. -Started on low-dose aspirin for VTE prophylaxis given his high risk of venous thromboembolism in the setting of nephrotic syndrome. -Will repeat PT and PTT to check for  paraproteinemia associated with coagulopathies which should now have resolved. -Continue close f/u with PCP and nephrology.  Return to care with Dr. [name] in 2 weeks. For further management.  I appreciate the privilege of being a part of the medical care of this fantastic gentleman.  All questions were answered. The patient knows to call the clinic with any problems, questions or concerns. I spent 25 minutes counseling the patient face to face. The total time spent in the appointment was 30 minutes and more than 50% was on counseling.   Sullivan Lone MD [place] Hematology/Oncology Physician [facility]  (Office):       [phone] (Work cell):  [phone] (Fax):           [phone]

## 2015-01-17 ENCOUNTER — Other Ambulatory Visit: Payer: Self-pay | Admitting: Internal Medicine

## 2015-01-17 ENCOUNTER — Telehealth: Payer: Self-pay | Admitting: *Deleted

## 2015-01-17 NOTE — Telephone Encounter (Signed)
Patient called to request a med refill for hydrALAZINE (APRESOLINE) 50 MG tablet. Please f/u with pt.

## 2015-01-17 NOTE — Telephone Encounter (Signed)
Informed patient of Stem Cell transplant appointment at [facility] (01/19/15 @ 9:15am). Patient verbalized understanding.

## 2015-01-21 ENCOUNTER — Other Ambulatory Visit (HOSPITAL_BASED_OUTPATIENT_CLINIC_OR_DEPARTMENT_OTHER): Payer: Self-pay

## 2015-01-21 ENCOUNTER — Ambulatory Visit (HOSPITAL_BASED_OUTPATIENT_CLINIC_OR_DEPARTMENT_OTHER): Payer: Self-pay

## 2015-01-21 ENCOUNTER — Telehealth: Payer: Self-pay | Admitting: *Deleted

## 2015-01-21 VITALS — BP 155/58 | HR 62 | Temp 97.2°F | Resp 18

## 2015-01-21 DIAGNOSIS — E858 Other amyloidosis: Secondary | ICD-10-CM

## 2015-01-21 DIAGNOSIS — Z5112 Encounter for antineoplastic immunotherapy: Secondary | ICD-10-CM

## 2015-01-21 DIAGNOSIS — Z5111 Encounter for antineoplastic chemotherapy: Secondary | ICD-10-CM

## 2015-01-21 DIAGNOSIS — E8581 Light chain (AL) amyloidosis: Secondary | ICD-10-CM

## 2015-01-21 LAB — CBC WITH DIFFERENTIAL/PLATELET
BASO%: 0.2 % (ref 0.0–2.0)
Basophils Absolute: 0 10*3/uL (ref 0.0–0.1)
EOS%: 3.8 % (ref 0.0–7.0)
Eosinophils Absolute: 0.2 10*3/uL (ref 0.0–0.5)
HEMATOCRIT: 31.6 % — AB (ref 38.4–49.9)
HGB: 10.6 g/dL — ABNORMAL LOW (ref 13.0–17.1)
LYMPH#: 1 10*3/uL (ref 0.9–3.3)
LYMPH%: 19 % (ref 14.0–49.0)
MCH: 30.5 pg (ref 27.2–33.4)
MCHC: 33.5 g/dL (ref 32.0–36.0)
MCV: 91.1 fL (ref 79.3–98.0)
MONO#: 0.4 10*3/uL (ref 0.1–0.9)
MONO%: 7.1 % (ref 0.0–14.0)
NEUT#: 3.5 10*3/uL (ref 1.5–6.5)
NEUT%: 69.9 % (ref 39.0–75.0)
Platelets: 113 10*3/uL — ABNORMAL LOW (ref 140–400)
RBC: 3.47 10*6/uL — AB (ref 4.20–5.82)
RDW: 15 % — ABNORMAL HIGH (ref 11.0–14.6)
WBC: 5 10*3/uL (ref 4.0–10.3)

## 2015-01-21 LAB — PROTIME-INR
INR: 1.1 — ABNORMAL LOW (ref 2.00–3.50)
Protime: 13.2 Seconds (ref 10.6–13.4)

## 2015-01-21 LAB — COMPREHENSIVE METABOLIC PANEL (CC13)
ALT: 17 U/L (ref 0–55)
AST: 17 U/L (ref 5–34)
Albumin: 3.4 g/dL — ABNORMAL LOW (ref 3.5–5.0)
Alkaline Phosphatase: 87 U/L (ref 40–150)
Anion Gap: 11 mEq/L (ref 3–11)
BUN: 35.1 mg/dL — AB (ref 7.0–26.0)
CHLORIDE: 110 meq/L — AB (ref 98–109)
CO2: 19 meq/L — AB (ref 22–29)
CREATININE: 2.6 mg/dL — AB (ref 0.7–1.3)
Calcium: 8.9 mg/dL (ref 8.4–10.4)
EGFR: 25 mL/min/{1.73_m2} — ABNORMAL LOW (ref >90)
Glucose: 123 mg/dl (ref 70–140)
Potassium: 3.2 mEq/L — ABNORMAL LOW (ref 3.5–5.1)
Sodium: 141 mEq/L (ref 136–145)
Total Bilirubin: 0.42 mg/dL (ref 0.20–1.20)
Total Protein: 6.2 g/dL — ABNORMAL LOW (ref 6.4–8.3)

## 2015-01-21 MED ORDER — CYCLOPHOSPHAMIDE CHEMO INJECTION 1 GM
500.0000 mg | Freq: Once | INTRAMUSCULAR | Status: AC
  Administered 2015-01-21: 500 mg via INTRAVENOUS
  Filled 2015-01-21: qty 25

## 2015-01-21 MED ORDER — ONDANSETRON HCL 40 MG/20ML IJ SOLN
Freq: Once | INTRAMUSCULAR | Status: AC
  Administered 2015-01-21: 09:00:00 via INTRAVENOUS
  Filled 2015-01-21: qty 4

## 2015-01-21 MED ORDER — BORTEZOMIB CHEMO SQ INJECTION 3.5 MG (2.5MG/ML)
1.3000 mg/m2 | Freq: Once | INTRAMUSCULAR | Status: AC
  Administered 2015-01-21: 2.5 mg via SUBCUTANEOUS
  Filled 2015-01-21: qty 2.5

## 2015-01-21 MED ORDER — SODIUM CHLORIDE 0.9 % IV SOLN
Freq: Once | INTRAVENOUS | Status: AC
  Administered 2015-01-21: 09:00:00 via INTRAVENOUS

## 2015-01-21 NOTE — Patient Instructions (Signed)
  [facility] [facility] Discharge Instructions for Patients Receiving Chemotherapy  Today you received the following chemotherapy agents: Velcade, cytoxan  To help prevent nausea and vomiting after your treatment, we encourage you to take your nausea medication as prescribed.  If you develop nausea and vomiting that is not controlled by your nausea medication, call the clinic.   BELOW ARE SYMPTOMS THAT SHOULD BE REPORTED IMMEDIATELY:  *FEVER GREATER THAN 100.5 F  *CHILLS WITH OR WITHOUT FEVER  NAUSEA AND VOMITING THAT IS NOT CONTROLLED WITH YOUR NAUSEA MEDICATION  *UNUSUAL SHORTNESS OF BREATH  *UNUSUAL BRUISING OR BLEEDING  TENDERNESS IN MOUTH AND THROAT WITH OR WITHOUT PRESENCE OF ULCERS  *URINARY PROBLEMS  *BOWEL PROBLEMS  UNUSUAL RASH Items with * indicate a potential emergency and should be followed up as soon as possible.  Feel free to call the clinic you have any questions or concerns. The clinic phone number is [phone].  Please show the [place] at check-in to the Emergency Department and triage nurse.

## 2015-01-21 NOTE — Telephone Encounter (Signed)
Contact number is invalid. Medical Assistant is not able to confirm which type of test strips patients needs to coencide with meter.

## 2015-01-22 LAB — APTT: aPTT: 41 seconds — ABNORMAL HIGH (ref 24–37)

## 2015-01-22 LAB — FIBRINOGEN: Fibrinogen: 533 mg/dL — ABNORMAL HIGH (ref 204–475)

## 2015-01-28 ENCOUNTER — Ambulatory Visit: Payer: Self-pay | Admitting: Hematology

## 2015-01-28 ENCOUNTER — Other Ambulatory Visit (HOSPITAL_BASED_OUTPATIENT_CLINIC_OR_DEPARTMENT_OTHER): Payer: Self-pay

## 2015-01-28 ENCOUNTER — Ambulatory Visit (HOSPITAL_BASED_OUTPATIENT_CLINIC_OR_DEPARTMENT_OTHER): Payer: Self-pay

## 2015-01-28 ENCOUNTER — Other Ambulatory Visit: Payer: Self-pay | Admitting: Hematology

## 2015-01-28 VITALS — BP 145/59 | HR 60 | Temp 97.9°F | Resp 18

## 2015-01-28 DIAGNOSIS — D638 Anemia in other chronic diseases classified elsewhere: Secondary | ICD-10-CM

## 2015-01-28 DIAGNOSIS — E858 Other amyloidosis: Secondary | ICD-10-CM

## 2015-01-28 DIAGNOSIS — Z5112 Encounter for antineoplastic immunotherapy: Secondary | ICD-10-CM

## 2015-01-28 DIAGNOSIS — E8581 Light chain (AL) amyloidosis: Secondary | ICD-10-CM

## 2015-01-28 DIAGNOSIS — D649 Anemia, unspecified: Secondary | ICD-10-CM

## 2015-01-28 DIAGNOSIS — Z5111 Encounter for antineoplastic chemotherapy: Secondary | ICD-10-CM

## 2015-01-28 LAB — CBC WITH DIFFERENTIAL/PLATELET
BASO%: 0.5 % (ref 0.0–2.0)
Basophils Absolute: 0 10*3/uL (ref 0.0–0.1)
EOS%: 3.1 % (ref 0.0–7.0)
Eosinophils Absolute: 0.2 10*3/uL (ref 0.0–0.5)
HCT: 28.5 % — ABNORMAL LOW (ref 38.4–49.9)
HEMOGLOBIN: 9.5 g/dL — AB (ref 13.0–17.1)
LYMPH%: 13.9 % — AB (ref 14.0–49.0)
MCH: 30.8 pg (ref 27.2–33.4)
MCHC: 33.3 g/dL (ref 32.0–36.0)
MCV: 92.5 fL (ref 79.3–98.0)
MONO#: 0.7 10*3/uL (ref 0.1–0.9)
MONO%: 11.7 % (ref 0.0–14.0)
NEUT#: 4.4 10*3/uL (ref 1.5–6.5)
NEUT%: 70.8 % (ref 39.0–75.0)
Platelets: 101 10*3/uL — ABNORMAL LOW (ref 140–400)
RBC: 3.08 10*6/uL — ABNORMAL LOW (ref 4.20–5.82)
RDW: 16.1 % — AB (ref 11.0–14.6)
WBC: 6.3 10*3/uL (ref 4.0–10.3)
lymph#: 0.9 10*3/uL (ref 0.9–3.3)

## 2015-01-28 MED ORDER — DARBEPOETIN ALFA 100 MCG/0.5ML IJ SOSY
40.0000 ug | PREFILLED_SYRINGE | Freq: Once | INTRAMUSCULAR | Status: AC
  Administered 2015-01-28: 40 ug via SUBCUTANEOUS
  Filled 2015-01-28: qty 0.5

## 2015-01-28 MED ORDER — CYANOCOBALAMIN 1000 MCG/ML IJ SOLN
1000.0000 ug | Freq: Once | INTRAMUSCULAR | Status: AC
  Administered 2015-01-28: 1000 ug via SUBCUTANEOUS

## 2015-01-28 MED ORDER — CYANOCOBALAMIN 1000 MCG/ML IJ SOLN
INTRAMUSCULAR | Status: AC
  Filled 2015-01-28: qty 1

## 2015-01-28 MED ORDER — SODIUM CHLORIDE 0.9 % IV SOLN
Freq: Once | INTRAVENOUS | Status: AC
  Administered 2015-01-28: 10:00:00 via INTRAVENOUS
  Filled 2015-01-28: qty 4

## 2015-01-28 MED ORDER — SODIUM CHLORIDE 0.9 % IV SOLN
500.0000 mg | Freq: Once | INTRAVENOUS | Status: AC
  Administered 2015-01-28: 500 mg via INTRAVENOUS
  Filled 2015-01-28: qty 25

## 2015-01-28 MED ORDER — BORTEZOMIB CHEMO SQ INJECTION 3.5 MG (2.5MG/ML)
1.3000 mg/m2 | Freq: Once | INTRAMUSCULAR | Status: AC
  Administered 2015-01-28: 2.5 mg via SUBCUTANEOUS
  Filled 2015-01-28: qty 2.5

## 2015-01-28 MED ORDER — SODIUM CHLORIDE 0.9 % IV SOLN
Freq: Once | INTRAVENOUS | Status: AC
  Administered 2015-01-28: 10:00:00 via INTRAVENOUS

## 2015-01-28 NOTE — Patient Instructions (Signed)
[facility] Discharge Instructions for Patients Receiving Chemotherapy  Today you received the following chemotherapy agents Cytoxan, Velcade  To help prevent nausea and vomiting after your treatment, we encourage you to take your nausea medication     If you develop nausea and vomiting that is not controlled by your nausea medication, call the clinic.   BELOW ARE SYMPTOMS THAT SHOULD BE REPORTED IMMEDIATELY:  *FEVER GREATER THAN 100.5 F  *CHILLS WITH OR WITHOUT FEVER  NAUSEA AND VOMITING THAT IS NOT CONTROLLED WITH YOUR NAUSEA MEDICATION  *UNUSUAL SHORTNESS OF BREATH  *UNUSUAL BRUISING OR BLEEDING  TENDERNESS IN MOUTH AND THROAT WITH OR WITHOUT PRESENCE OF ULCERS  *URINARY PROBLEMS  *BOWEL PROBLEMS  UNUSUAL RASH Items with * indicate a potential emergency and should be followed up as soon as possible.  Feel free to call the clinic you have any questions or concerns. The clinic phone number is [phone].  Please show the [place] at check-in to the Emergency Department and triage nurse.

## 2015-01-31 ENCOUNTER — Ambulatory Visit: Payer: Self-pay | Admitting: Hematology

## 2015-01-31 ENCOUNTER — Other Ambulatory Visit: Payer: Self-pay | Admitting: Internal Medicine

## 2015-01-31 ENCOUNTER — Other Ambulatory Visit: Payer: Self-pay

## 2015-02-04 ENCOUNTER — Other Ambulatory Visit: Payer: Self-pay | Admitting: Hematology

## 2015-02-04 ENCOUNTER — Other Ambulatory Visit: Payer: Self-pay | Admitting: *Deleted

## 2015-02-04 ENCOUNTER — Ambulatory Visit (HOSPITAL_BASED_OUTPATIENT_CLINIC_OR_DEPARTMENT_OTHER): Payer: Self-pay

## 2015-02-04 ENCOUNTER — Telehealth: Payer: Self-pay | Admitting: Hematology

## 2015-02-04 ENCOUNTER — Telehealth: Payer: Self-pay | Admitting: *Deleted

## 2015-02-04 VITALS — BP 153/57 | HR 66 | Temp 98.2°F | Resp 18

## 2015-02-04 DIAGNOSIS — E8581 Light chain (AL) amyloidosis: Secondary | ICD-10-CM

## 2015-02-04 DIAGNOSIS — E858 Other amyloidosis: Secondary | ICD-10-CM

## 2015-02-04 DIAGNOSIS — Z5112 Encounter for antineoplastic immunotherapy: Secondary | ICD-10-CM

## 2015-02-04 DIAGNOSIS — Z5111 Encounter for antineoplastic chemotherapy: Secondary | ICD-10-CM

## 2015-02-04 LAB — CBC & DIFF AND RETIC
BASO%: 0.4 % (ref 0.0–2.0)
Basophils Absolute: 0 10*3/uL (ref 0.0–0.1)
EOS%: 3.3 % (ref 0.0–7.0)
Eosinophils Absolute: 0.2 10*3/uL (ref 0.0–0.5)
HEMATOCRIT: 30.2 % — AB (ref 38.4–49.9)
HEMOGLOBIN: 10 g/dL — AB (ref 13.0–17.1)
IMMATURE RETIC FRACT: 10.8 % — AB (ref 3.00–10.60)
LYMPH#: 0.6 10*3/uL — AB (ref 0.9–3.3)
LYMPH%: 11.5 % — AB (ref 14.0–49.0)
MCH: 30.9 pg (ref 27.2–33.4)
MCHC: 33.1 g/dL (ref 32.0–36.0)
MCV: 93.2 fL (ref 79.3–98.0)
MONO#: 0.5 10*3/uL (ref 0.1–0.9)
MONO%: 10.7 % (ref 0.0–14.0)
NEUT%: 74.1 % (ref 39.0–75.0)
NEUTROS ABS: 3.6 10*3/uL (ref 1.5–6.5)
Platelets: 111 10*3/uL — ABNORMAL LOW (ref 140–400)
RBC: 3.24 10*6/uL — ABNORMAL LOW (ref 4.20–5.82)
RDW: 15.8 % — AB (ref 11.0–14.6)
Retic %: 4.44 % — ABNORMAL HIGH (ref 0.80–1.80)
Retic Ct Abs: 143.86 10*3/uL — ABNORMAL HIGH (ref 34.80–93.90)
WBC: 4.9 10*3/uL (ref 4.0–10.3)

## 2015-02-04 LAB — COMPREHENSIVE METABOLIC PANEL (CC13)
ALBUMIN: 3.1 g/dL — AB (ref 3.5–5.0)
ALK PHOS: 74 U/L (ref 40–150)
ALT: 13 U/L (ref 0–55)
AST: 18 U/L (ref 5–34)
Anion Gap: 11 mEq/L (ref 3–11)
BILIRUBIN TOTAL: 0.36 mg/dL (ref 0.20–1.20)
BUN: 31.6 mg/dL — AB (ref 7.0–26.0)
CALCIUM: 8.4 mg/dL (ref 8.4–10.4)
CO2: 18 mEq/L — ABNORMAL LOW (ref 22–29)
Chloride: 115 mEq/L — ABNORMAL HIGH (ref 98–109)
Creatinine: 2.8 mg/dL — ABNORMAL HIGH (ref 0.7–1.3)
EGFR: 23 mL/min/{1.73_m2} — AB (ref >90)
GLUCOSE: 116 mg/dL (ref 70–140)
Potassium: 3.3 mEq/L — ABNORMAL LOW (ref 3.5–5.1)
SODIUM: 144 meq/L (ref 136–145)
TOTAL PROTEIN: 5.7 g/dL — AB (ref 6.4–8.3)

## 2015-02-04 LAB — TECHNOLOGIST REVIEW

## 2015-02-04 MED ORDER — SODIUM CHLORIDE 0.9 % IV SOLN
Freq: Once | INTRAVENOUS | Status: AC
  Administered 2015-02-04: 10:00:00 via INTRAVENOUS
  Filled 2015-02-04: qty 4

## 2015-02-04 MED ORDER — BORTEZOMIB CHEMO SQ INJECTION 3.5 MG (2.5MG/ML)
1.3000 mg/m2 | Freq: Once | INTRAMUSCULAR | Status: AC
  Administered 2015-02-04: 2.5 mg via SUBCUTANEOUS
  Filled 2015-02-04: qty 2.5

## 2015-02-04 MED ORDER — CYCLOPHOSPHAMIDE CHEMO INJECTION 1 GM
500.0000 mg | Freq: Once | INTRAMUSCULAR | Status: AC
  Administered 2015-02-04: 500 mg via INTRAVENOUS
  Filled 2015-02-04: qty 25

## 2015-02-04 MED ORDER — SODIUM CHLORIDE 0.9 % IV SOLN
Freq: Once | INTRAVENOUS | Status: AC
  Administered 2015-02-04: 10:00:00 via INTRAVENOUS

## 2015-02-04 NOTE — Telephone Encounter (Signed)
pt in chemo room to get updated sch b4 leaving

## 2015-02-04 NOTE — Patient Instructions (Signed)
[facility] Discharge Instructions for Patients Receiving Chemotherapy  Today you received the following chemotherapy agents :  Velcade,  Cytoxan.  To help prevent nausea and vomiting after your treatment, we encourage you to take your nausea medication as prescribed.   If you develop nausea and vomiting that is not controlled by your nausea medication, call the clinic.   BELOW ARE SYMPTOMS THAT SHOULD BE REPORTED IMMEDIATELY:  *FEVER GREATER THAN 100.5 F  *CHILLS WITH OR WITHOUT FEVER  NAUSEA AND VOMITING THAT IS NOT CONTROLLED WITH YOUR NAUSEA MEDICATION  *UNUSUAL SHORTNESS OF BREATH  *UNUSUAL BRUISING OR BLEEDING  TENDERNESS IN MOUTH AND THROAT WITH OR WITHOUT PRESENCE OF ULCERS  *URINARY PROBLEMS  *BOWEL PROBLEMS  UNUSUAL RASH Items with * indicate a potential emergency and should be followed up as soon as possible.  Feel free to call the clinic you have any questions or concerns. The clinic phone number is [phone].  Please show the [place] at check-in to the Emergency Department and triage nurse.

## 2015-02-04 NOTE — Telephone Encounter (Signed)
Per staff message and POF I have scheduled appts. Advised scheduler of appts and to move labs. JMW  

## 2015-02-04 NOTE — Telephone Encounter (Signed)
per pof tos ch pt appt-sent MW email to sch pt trmt-will call pt after reply °

## 2015-02-04 NOTE — Progress Notes (Signed)
OK to treat today with Crea 2.8 as per Delle Reining, desk nurse for Dr. [name].  Per Delle Reining, desk nurse - No B12 injection needed today.

## 2015-02-08 LAB — SPEP & IFE WITH QIG
ABNORMAL PROTEIN BAND1: 0.1 g/dL
ALPHA-2-GLOBULIN: 0.7 g/dL (ref 0.5–0.9)
Albumin ELP: 3.2 g/dL — ABNORMAL LOW (ref 3.8–4.8)
Alpha-1-Globulin: 0.3 g/dL (ref 0.2–0.3)
Beta 2: 0.2 g/dL (ref 0.2–0.5)
Beta Globulin: 0.3 g/dL — ABNORMAL LOW (ref 0.4–0.6)
Gamma Globulin: 0.2 g/dL — ABNORMAL LOW (ref 0.8–1.7)
IGM, SERUM: 71 mg/dL (ref 41–251)
IgA: 59 mg/dL — ABNORMAL LOW (ref 68–379)
IgG (Immunoglobin G), Serum: 268 mg/dL — ABNORMAL LOW (ref 650–1600)
Total Protein, Serum Electrophoresis: 5.4 g/dL — ABNORMAL LOW (ref 6.1–8.1)

## 2015-02-08 LAB — KAPPA/LAMBDA LIGHT CHAINS
KAPPA LAMBDA RATIO: 1.43 (ref 0.26–1.65)
Kappa free light chain: 2.98 mg/dL — ABNORMAL HIGH (ref 0.33–1.94)
Lambda Free Lght Chn: 2.09 mg/dL (ref 0.57–2.63)

## 2015-02-11 ENCOUNTER — Other Ambulatory Visit: Payer: Self-pay

## 2015-02-11 ENCOUNTER — Other Ambulatory Visit (HOSPITAL_BASED_OUTPATIENT_CLINIC_OR_DEPARTMENT_OTHER): Payer: Self-pay

## 2015-02-11 ENCOUNTER — Ambulatory Visit (HOSPITAL_BASED_OUTPATIENT_CLINIC_OR_DEPARTMENT_OTHER): Payer: Self-pay

## 2015-02-11 VITALS — BP 155/59 | HR 60 | Temp 97.5°F | Resp 18

## 2015-02-11 DIAGNOSIS — E858 Other amyloidosis: Secondary | ICD-10-CM

## 2015-02-11 DIAGNOSIS — E8581 Light chain (AL) amyloidosis: Secondary | ICD-10-CM

## 2015-02-11 DIAGNOSIS — Z5112 Encounter for antineoplastic immunotherapy: Secondary | ICD-10-CM

## 2015-02-11 DIAGNOSIS — Z5111 Encounter for antineoplastic chemotherapy: Secondary | ICD-10-CM

## 2015-02-11 LAB — CBC & DIFF AND RETIC
BASO%: 0 % (ref 0.0–2.0)
BASOS ABS: 0 10*3/uL (ref 0.0–0.1)
EOS ABS: 0.2 10*3/uL (ref 0.0–0.5)
EOS%: 3.8 % (ref 0.0–7.0)
HEMATOCRIT: 32.5 % — AB (ref 38.4–49.9)
HEMOGLOBIN: 10.8 g/dL — AB (ref 13.0–17.1)
IMMATURE RETIC FRACT: 1.8 % — AB (ref 3.00–10.60)
LYMPH%: 15 % (ref 14.0–49.0)
MCH: 30.8 pg (ref 27.2–33.4)
MCHC: 33.2 g/dL (ref 32.0–36.0)
MCV: 92.6 fL (ref 79.3–98.0)
MONO#: 0.6 10*3/uL (ref 0.1–0.9)
MONO%: 11 % (ref 0.0–14.0)
NEUT%: 70.2 % (ref 39.0–75.0)
NEUTROS ABS: 3.7 10*3/uL (ref 1.5–6.5)
PLATELETS: 94 10*3/uL — AB (ref 140–400)
RBC: 3.51 10*6/uL — AB (ref 4.20–5.82)
RDW: 15.6 % — ABNORMAL HIGH (ref 11.0–14.6)
Retic %: 2.82 % — ABNORMAL HIGH (ref 0.80–1.80)
Retic Ct Abs: 98.98 10*3/uL — ABNORMAL HIGH (ref 34.80–93.90)
WBC: 5.3 10*3/uL (ref 4.0–10.3)
lymph#: 0.8 10*3/uL — ABNORMAL LOW (ref 0.9–3.3)

## 2015-02-11 LAB — COMPREHENSIVE METABOLIC PANEL
ALT: 13 U/L (ref 0–55)
ANION GAP: 12 meq/L — AB (ref 3–11)
AST: 18 U/L (ref 5–34)
Albumin: 3.4 g/dL — ABNORMAL LOW (ref 3.5–5.0)
Alkaline Phosphatase: 71 U/L (ref 40–150)
BUN: 39.1 mg/dL — ABNORMAL HIGH (ref 7.0–26.0)
CHLORIDE: 111 meq/L — AB (ref 98–109)
CO2: 19 meq/L — AB (ref 22–29)
CREATININE: 2.6 mg/dL — AB (ref 0.7–1.3)
Calcium: 8.8 mg/dL (ref 8.4–10.4)
EGFR: 26 mL/min/{1.73_m2} — ABNORMAL LOW (ref >90)
Glucose: 89 mg/dl (ref 70–140)
POTASSIUM: 3.5 meq/L (ref 3.5–5.1)
Sodium: 142 mEq/L (ref 136–145)
Total Bilirubin: 0.42 mg/dL (ref 0.20–1.20)
Total Protein: 6.1 g/dL — ABNORMAL LOW (ref 6.4–8.3)

## 2015-02-11 MED ORDER — CYCLOPHOSPHAMIDE CHEMO INJECTION 1 GM
500.0000 mg | Freq: Once | INTRAMUSCULAR | Status: AC
  Administered 2015-02-11: 500 mg via INTRAVENOUS
  Filled 2015-02-11: qty 25

## 2015-02-11 MED ORDER — SODIUM CHLORIDE 0.9 % IV SOLN
Freq: Once | INTRAVENOUS | Status: AC
  Administered 2015-02-11: 14:00:00 via INTRAVENOUS

## 2015-02-11 MED ORDER — SODIUM CHLORIDE 0.9 % IV SOLN
Freq: Once | INTRAVENOUS | Status: AC
  Administered 2015-02-11: 14:00:00 via INTRAVENOUS
  Filled 2015-02-11: qty 4

## 2015-02-11 MED ORDER — BORTEZOMIB CHEMO SQ INJECTION 3.5 MG (2.5MG/ML)
1.3000 mg/m2 | Freq: Once | INTRAMUSCULAR | Status: AC
  Administered 2015-02-11: 2.5 mg via SUBCUTANEOUS
  Filled 2015-02-11: qty 2.5

## 2015-02-11 NOTE — Progress Notes (Signed)
CBC and CMET reviewed by Dr. Alvy Bimler and she gave order ok to treat today as ordered by Dr. [name].

## 2015-02-11 NOTE — Patient Instructions (Signed)
[facility] Discharge Instructions for Patients Receiving Chemotherapy  Today you received the following chemotherapy agents: Cytoxan and Velcade  To help prevent nausea and vomiting after your treatment, we encourage you to take your nausea medication as directed.    If you develop nausea and vomiting that is not controlled by your nausea medication, call the clinic.   BELOW ARE SYMPTOMS THAT SHOULD BE REPORTED IMMEDIATELY:  *FEVER GREATER THAN 100.5 F  *CHILLS WITH OR WITHOUT FEVER  NAUSEA AND VOMITING THAT IS NOT CONTROLLED WITH YOUR NAUSEA MEDICATION  *UNUSUAL SHORTNESS OF BREATH  *UNUSUAL BRUISING OR BLEEDING  TENDERNESS IN MOUTH AND THROAT WITH OR WITHOUT PRESENCE OF ULCERS  *URINARY PROBLEMS  *BOWEL PROBLEMS  UNUSUAL RASH Items with * indicate a potential emergency and should be followed up as soon as possible.  Feel free to call the clinic you have any questions or concerns. The clinic phone number is [phone].  Please show the CHEMO ALERT CARD at check-in to the Emergency Department and triage nurse.   

## 2015-02-12 LAB — 24 HR URINE,KAPPA/LAMBDA LIGHT CHAINS
MEASURED KAPPA CHAIN: 1.61 mg/dL (ref <2.00)
Measured Lambda Chain: 1.66 mg/dL (ref <2.00)
TOTAL KAPPA CHAIN: 56.35 mg/(24.h)
TOTAL LAMBDA CHAIN: 58.1 mg/(24.h)
URINE VOLUME: 3500 mL/(24.h)

## 2015-02-12 LAB — UIFE/LIGHT CHAINS/TP QN, 24-HR UR
ALBUMIN, U: DETECTED
Alpha 1, Urine: DETECTED — AB
Alpha 2, Urine: DETECTED — AB
BETA UR: DETECTED — AB
GAMMA UR: DETECTED — AB
Time: 24 hours
Total Protein, Urine: 272 mg/dL — ABNORMAL HIGH (ref 5–25)
Volume, Urine: 3500 mL

## 2015-02-14 ENCOUNTER — Other Ambulatory Visit: Payer: Self-pay | Admitting: Family Medicine

## 2015-02-14 DIAGNOSIS — I10 Essential (primary) hypertension: Secondary | ICD-10-CM

## 2015-02-14 DIAGNOSIS — N183 Chronic kidney disease, stage 3 unspecified: Secondary | ICD-10-CM

## 2015-02-14 DIAGNOSIS — E8581 Light chain (AL) amyloidosis: Secondary | ICD-10-CM

## 2015-02-14 DIAGNOSIS — R609 Edema, unspecified: Secondary | ICD-10-CM

## 2015-02-14 MED ORDER — CYCLOPHOSPHAMIDE CHEMO INJECTION 500 MG: 500.0000 mg | INTRAMUSCULAR | Status: DC

## 2015-02-14 MED ORDER — FUROSEMIDE 40 MG PO TABS: ORAL_TABLET | ORAL | Status: DC

## 2015-02-14 NOTE — Progress Notes (Signed)
Reviewed renal consult note from 02/10/2015 with Dr. [name]  Updated med list: added cytoxan, updated dose change of lasix  Noted will be scanned in patient's chart

## 2015-02-15 ENCOUNTER — Emergency Department (HOSPITAL_COMMUNITY): Payer: Self-pay

## 2015-02-15 ENCOUNTER — Emergency Department (HOSPITAL_COMMUNITY)
Admission: EM | Admit: 2015-02-15 | Discharge: 2015-02-15 | Disposition: A | Discharge status: Home / Self Care | Payer: Self-pay | Attending: Emergency Medicine | Admitting: Emergency Medicine

## 2015-02-15 ENCOUNTER — Encounter (HOSPITAL_COMMUNITY): Payer: Self-pay | Admitting: Emergency Medicine

## 2015-02-15 DIAGNOSIS — Z7982 Long term (current) use of aspirin: Secondary | ICD-10-CM | POA: Insufficient documentation

## 2015-02-15 DIAGNOSIS — M549 Dorsalgia, unspecified: Secondary | ICD-10-CM | POA: Insufficient documentation

## 2015-02-15 DIAGNOSIS — I129 Hypertensive chronic kidney disease with stage 1 through stage 4 chronic kidney disease, or unspecified chronic kidney disease: Secondary | ICD-10-CM | POA: Insufficient documentation

## 2015-02-15 DIAGNOSIS — D649 Anemia, unspecified: Secondary | ICD-10-CM | POA: Insufficient documentation

## 2015-02-15 DIAGNOSIS — Z79899 Other long term (current) drug therapy: Secondary | ICD-10-CM | POA: Insufficient documentation

## 2015-02-15 DIAGNOSIS — N183 Chronic kidney disease, stage 3 (moderate): Secondary | ICD-10-CM | POA: Insufficient documentation

## 2015-02-15 DIAGNOSIS — R531 Weakness: Secondary | ICD-10-CM | POA: Insufficient documentation

## 2015-02-15 DIAGNOSIS — I5032 Chronic diastolic (congestive) heart failure: Secondary | ICD-10-CM | POA: Insufficient documentation

## 2015-02-15 DIAGNOSIS — M25551 Pain in right hip: Secondary | ICD-10-CM | POA: Insufficient documentation

## 2015-02-15 DIAGNOSIS — Z8639 Personal history of other endocrine, nutritional and metabolic disease: Secondary | ICD-10-CM | POA: Insufficient documentation

## 2015-02-15 DIAGNOSIS — Z87891 Personal history of nicotine dependence: Secondary | ICD-10-CM | POA: Insufficient documentation

## 2015-02-15 LAB — COMPREHENSIVE METABOLIC PANEL
ALBUMIN: 3.7 g/dL (ref 3.5–5.0)
ALT: 21 U/L (ref 17–63)
AST: 20 U/L (ref 15–41)
Alkaline Phosphatase: 62 U/L (ref 38–126)
Anion gap: 8 (ref 5–15)
BUN: 50 mg/dL — AB (ref 6–20)
CHLORIDE: 108 mmol/L (ref 101–111)
CO2: 25 mmol/L (ref 22–32)
CREATININE: 2.87 mg/dL — AB (ref 0.61–1.24)
Calcium: 8.8 mg/dL — ABNORMAL LOW (ref 8.9–10.3)
GFR calc Af Amer: 25 mL/min — ABNORMAL LOW (ref >60)
GFR calc non Af Amer: 22 mL/min — ABNORMAL LOW (ref >60)
GLUCOSE: 132 mg/dL — AB (ref 65–99)
POTASSIUM: 3.2 mmol/L — AB (ref 3.5–5.1)
Sodium: 141 mmol/L (ref 135–145)
Total Bilirubin: 0.5 mg/dL (ref 0.3–1.2)
Total Protein: 6.3 g/dL — ABNORMAL LOW (ref 6.5–8.1)

## 2015-02-15 LAB — CBC WITH DIFFERENTIAL/PLATELET
Basophils Absolute: 0 10*3/uL (ref 0.0–0.1)
Basophils Relative: 1 %
EOS PCT: 2 %
Eosinophils Absolute: 0.1 10*3/uL (ref 0.0–0.7)
HEMATOCRIT: 32 % — AB (ref 39.0–52.0)
Hemoglobin: 10.6 g/dL — ABNORMAL LOW (ref 13.0–17.0)
LYMPHS ABS: 0.4 10*3/uL — AB (ref 0.7–4.0)
LYMPHS PCT: 12 %
MCH: 30.6 pg (ref 26.0–34.0)
MCHC: 33.1 g/dL (ref 30.0–36.0)
MCV: 92.5 fL (ref 78.0–100.0)
MONO ABS: 0.5 10*3/uL (ref 0.1–1.0)
MONOS PCT: 14 %
Neutro Abs: 2.4 10*3/uL (ref 1.7–7.7)
Neutrophils Relative %: 71 %
Platelets: 98 10*3/uL — ABNORMAL LOW (ref 150–400)
RBC: 3.46 MIL/uL — AB (ref 4.22–5.81)
RDW: 15.4 % (ref 11.5–15.5)
WBC: 3.3 10*3/uL — ABNORMAL LOW (ref 4.0–10.5)

## 2015-02-15 MED ORDER — HYDROCODONE-ACETAMINOPHEN 5-325 MG PO TABS: 1.0000 | ORAL_TABLET | ORAL | Status: DC | PRN

## 2015-02-15 NOTE — ED Provider Notes (Signed)
CSN: [id number]     Arrival date & time 02/15/15  9292 History   First MD Initiated Contact with Patient 02/15/15 1031     Chief Complaint  Patient presents with  . Hip Pain  . Chemotherapy     (Consider location/radiation/quality/duration/timing/severity/associated sxs/prior Treatment) Patient is a 64 y.o. male presenting with hip pain. The history is provided by the patient.  Hip Pain This is a new problem. Pertinent negatives include no chest pain, no abdominal pain and no shortness of breath.   patient has had pain in his right hip/buttock area for the last few days. States it feels as if he had slept on a hard bed. No trauma. States gets worse with walking. States that if he had walked out to the car he would have a limp with it. No fevers. States there may be some weakness with it. States he's also had pain in the lower part of his leg on the outside. He has amyloidosis and is on chemotherapy. He has some chronic renal insufficiency. No abdominal pain. No fevers. He does have bruising on his toes were he dropped something on it a few days ago. He states it feels somewhat like a pulled muscle or a nerve problem but states he's had these before and always gone away.  Past Medical History  Diagnosis Date  . Hypertension Dx 2012  . Hyperlipidemia Dx 2012  . History of blood transfusion 09/08/2014    "got hematoma after renal biopsy & HgB dropped"  . Anemia Dx 2016  . Family history of adverse reaction to anesthesia     "my daughter can't take certain anesthesia agents" (09/08/2014)  . Enlarged heart 2015  . Chronic diastolic CHF (congestive heart failure), NYHA class 1 ([place])     /hospital problem list 09/08/2014  . Renal disorder     "there is a spot on my kidney" (09/08/2014)  . Chronic renal disease, stage III     /hospital problem list 09/08/2014   Past Surgical History  Procedure Laterality Date  . Renal biopsy, percutaneous Right 09/08/2014  . Tonsillectomy  ~ 1960  .  Laparoscopic cholecystectomy  03/2011   Family History  Problem Relation Age of Onset  . Hypertension Mother   . Diabetes Mother   . Heart disease Mother   . Skin cancer Mother   . Hypertension Father   . Diabetes Father   . Diabetes Sister    Social History  Substance Use Topics  . Smoking status: Former Smoker -- 2.00 packs/day for 40 years    Types: Cigarettes    Quit date: 03/19/2011  . Smokeless tobacco: Never Used  . Alcohol Use: No    Review of Systems  Constitutional: Negative for activity change and appetite change.  Respiratory: Negative for shortness of breath.   Cardiovascular: Negative for chest pain.  Gastrointestinal: Negative for abdominal pain.  Genitourinary: Negative for flank pain.  Musculoskeletal: Positive for back pain.  Skin: Negative for pallor and wound.  Neurological: Positive for weakness.      Allergies  Review of patient's allergies indicates no known allergies.  Home Medications   Prior to Admission medications   Medication Sig Start Date End Date Taking? Authorizing Provider  acyclovir (ZOVIRAX) 400 MG tablet Take 1 tablet (400 mg total) by mouth daily. (Dose adjusted for renal function) 10/11/14  Yes Gautam Juleen China, MD  amLODipine (NORVASC) 10 MG tablet Take 1 tablet (10 mg total) by mouth daily. 01/03/15  Yes [name], MD  aspirin EC 81 MG tablet Take 1 tablet (81 mg total) by mouth daily. 01/14/15  Yes Gautam Juleen China, MD  Cyanocobalamin (B-12 COMPLIANCE INJECTION IJ) Inject 1 mL as directed every 30 (thirty) days.   Yes Historical Provider, MD  cyclophosphamide (CYTOXAN) 500 MG chemo injection Inject 25 mLs (500 mg total) into the vein once a week. 02/14/15  Yes [name], MD  furosemide (LASIX) 40 MG tablet 80 mg Monday, Tuesday, Wednesay Patient taking differently: Take 80 mg by mouth 2 (two) times daily.  02/14/15  Yes Josalyn Funches, MD  gabapentin (NEURONTIN) 250 MG/5ML solution Take 4 mLs (200 mg total) by  mouth at bedtime. 12/03/14  Yes [name], MD  hydrALAZINE (APRESOLINE) 50 MG tablet TAKE 1 TABLET BY MOUTH 3 TIMES DAILY. 01/17/15  Yes Josalyn Funches, MD  LORazepam (ATIVAN) 1 MG tablet Take 1 tablet (1 mg total) by mouth every 8 (eight) hours as needed for anxiety (or nausea). 10/11/14  Yes [name], MD  metoprolol (LOPRESSOR) 100 MG tablet TAKE 1 TABLET BY MOUTH 2 TIMES A DAY 02/01/15  Yes Josalyn Funches, MD  ondansetron (ZOFRAN) 8 MG tablet Take 1 tablet (8 mg total) by mouth 2 (two) times daily as needed (Nausea or vomiting). 10/11/14  Yes [name], MD  prochlorperazine (COMPAZINE) 10 MG tablet Take 1 tablet (10 mg total) by mouth every 6 (six) hours as needed (Nausea or vomiting). 10/11/14  Yes [name], MD  vitamin E 100 UNIT capsule Take 100 Units by mouth every other day.    Yes Historical Provider, MD  b complex vitamins capsule Take 1 capsule by mouth daily. Patient not taking: Reported on 02/15/2015 11/19/14   [name], MD  dexamethasone (DECADRON) 4 MG tablet Take 5 tablets (20 mg total) by mouth once a week. On Day 1,8,15 and 22. 30-35mns prior to cyclophosphamide 12/03/14   GBrunetta Genera MD  [facility] M[facility]/ACT nasal spray Place 1 spray into both nostrils daily. Patient not taking: Reported on 02/15/2015 01/14/15   GBrunetta Genera MD  HYDROcodone-acetaminophen (NORCO/VICODIN) 5-325 MG tablet Take 1-2 tablets by mouth every 4 (four) hours as needed. 02/15/15   NDavonna Belling MD   BP 178/69 mmHg  Pulse 63  Temp(Src) 98.7 F (37.1 C) (Oral)  Resp 16  SpO2 100% Physical Exam  Constitutional: He appears well-developed.  Cardiovascular: Normal rate.   Pulmonary/Chest: Effort normal.  Abdominal: Soft.  Musculoskeletal: He exhibits tenderness.  Mild tenderness posterior and somewhat superior to the femur and hip. No rash. No swelling. Good straight leg raise. Neurovascularly intact to right foot. Slight limp with gait. Dorsalis  pedis and popliteal pulses intact good capillary refill in foot.  some tenderness over the bony hip up on the pelvis.  Neurological: He is alert.  Skin: Skin is warm.    ED Course  Procedures (including critical care time) Labs Review Labs Reviewed  COMPREHENSIVE METABOLIC PANEL - Abnormal; Notable for the following:    Potassium 3.2 (*)    Glucose, Bld 132 (*)    BUN 50 (*)    Creatinine, Ser 2.87 (*)    Calcium 8.8 (*)    Total Protein 6.3 (*)    GFR calc non Af Amer 22 (*)    GFR calc Af Amer 25 (*)    All other components within normal limits  CBC WITH DIFFERENTIAL/PLATELET - Abnormal; Notable for the following:    WBC 3.3 (*)    RBC 3.46 (*)  Hemoglobin 10.6 (*)    HCT 32.0 (*)    Platelets 98 (*)    Lymphs Abs 0.4 (*)    All other components within normal limits    Imaging Review Dg Pelvis 1-2 Views  02/15/2015  CLINICAL DATA:  64 year old male with right hip pain after increased activity recently. Multiple myeloma diagnosed earlier this year. Subsequent encounter. EXAM: PELVIS - 1-2 VIEW COMPARISON:  PET-CT 10/14/2014, and earlier FINDINGS: Femoral heads are normally located and hip joint spaces are within normal limits for age. Pelvis intact. Visible bone mineralization within normal limits. No lytic osseous lesion identified. Grossly intact proximal femurs. SI joints within normal limits. Negative visible bowel gas pattern. Stable left pelvic phlebolith. Calcified aortic and iliac atherosclerosis. IMPRESSION: No acute osseous abnormality identified about the pelvis. Electronically Signed   By: Genevie Ann M.D.   On: 02/15/2015 11:47   I have personally reviewed and evaluated these images and lab results as part of my medical decision-making.   EKG Interpretation None      MDM   Final diagnoses:  Hip pain, right    Patient with right hip pain. Pain is worse somewhat posterior to the hip. Doubt DVT. Doubt arterial abnormality. Has history of amyloidosis. X-ray  reassuring. Lab work reassuring. Will discharge home follow-up as needed.    Davonna Belling, MD 02/15/15 1340

## 2015-02-15 NOTE — ED Notes (Signed)
Healthy appearing chemo pt c/o rt hip pain.  States that pain began on Thursday after moving some boxes but does not remember specifically injuring it.  Pt states that it feels like a pinched nerve.  Denies NVD.

## 2015-02-15 NOTE — Discharge Instructions (Signed)

## 2015-02-18 ENCOUNTER — Telehealth: Payer: Self-pay | Admitting: *Deleted

## 2015-02-18 ENCOUNTER — Ambulatory Visit (HOSPITAL_BASED_OUTPATIENT_CLINIC_OR_DEPARTMENT_OTHER): Payer: Self-pay

## 2015-02-18 ENCOUNTER — Telehealth: Payer: Self-pay | Admitting: Hematology

## 2015-02-18 ENCOUNTER — Other Ambulatory Visit (HOSPITAL_BASED_OUTPATIENT_CLINIC_OR_DEPARTMENT_OTHER): Payer: Self-pay

## 2015-02-18 ENCOUNTER — Ambulatory Visit (HOSPITAL_BASED_OUTPATIENT_CLINIC_OR_DEPARTMENT_OTHER): Payer: Self-pay | Admitting: Hematology

## 2015-02-18 ENCOUNTER — Ambulatory Visit (HOSPITAL_COMMUNITY)
Admission: RE | Admit: 2015-02-18 | Discharge: 2015-02-18 | Disposition: A | Discharge status: Home / Self Care | Payer: Self-pay | Source: Ambulatory Visit | Attending: Hematology | Admitting: Hematology

## 2015-02-18 ENCOUNTER — Encounter: Payer: Self-pay | Admitting: Hematology

## 2015-02-18 VITALS — BP 137/63 | HR 60 | Temp 97.8°F | Resp 18 | Ht 68.0 in | Wt 192.7 lb

## 2015-02-18 DIAGNOSIS — N189 Chronic kidney disease, unspecified: Secondary | ICD-10-CM

## 2015-02-18 DIAGNOSIS — E858 Other amyloidosis: Secondary | ICD-10-CM

## 2015-02-18 DIAGNOSIS — M25559 Pain in unspecified hip: Secondary | ICD-10-CM | POA: Insufficient documentation

## 2015-02-18 DIAGNOSIS — E8581 Light chain (AL) amyloidosis: Secondary | ICD-10-CM

## 2015-02-18 DIAGNOSIS — M25551 Pain in right hip: Secondary | ICD-10-CM | POA: Insufficient documentation

## 2015-02-18 DIAGNOSIS — Z5111 Encounter for antineoplastic chemotherapy: Secondary | ICD-10-CM

## 2015-02-18 DIAGNOSIS — Z5112 Encounter for antineoplastic immunotherapy: Secondary | ICD-10-CM

## 2015-02-18 LAB — CBC WITH DIFFERENTIAL/PLATELET
BASO%: 0.2 % (ref 0.0–2.0)
BASOS ABS: 0 10*3/uL (ref 0.0–0.1)
EOS%: 5.1 % (ref 0.0–7.0)
Eosinophils Absolute: 0.2 10*3/uL (ref 0.0–0.5)
HCT: 30.6 % — ABNORMAL LOW (ref 38.4–49.9)
HGB: 10.1 g/dL — ABNORMAL LOW (ref 13.0–17.1)
LYMPH%: 11.5 % — ABNORMAL LOW (ref 14.0–49.0)
MCH: 30.5 pg (ref 27.2–33.4)
MCHC: 33 g/dL (ref 32.0–36.0)
MCV: 92.4 fL (ref 79.3–98.0)
MONO#: 0.4 10*3/uL (ref 0.1–0.9)
MONO%: 9.7 % (ref 0.0–14.0)
NEUT#: 3.3 10*3/uL (ref 1.5–6.5)
NEUT%: 73.5 % (ref 39.0–75.0)
Platelets: 99 10*3/uL — ABNORMAL LOW (ref 140–400)
RBC: 3.31 10*6/uL — AB (ref 4.20–5.82)
RDW: 15.2 % — ABNORMAL HIGH (ref 11.0–14.6)
WBC: 4.5 10*3/uL (ref 4.0–10.3)
lymph#: 0.5 10*3/uL — ABNORMAL LOW (ref 0.9–3.3)
nRBC: 0 % (ref 0–0)

## 2015-02-18 LAB — COMPREHENSIVE METABOLIC PANEL
ALK PHOS: 67 U/L (ref 40–150)
ALT: 10 U/L (ref 0–55)
AST: 17 U/L (ref 5–34)
Albumin: 3.3 g/dL — ABNORMAL LOW (ref 3.5–5.0)
Anion Gap: 12 mEq/L — ABNORMAL HIGH (ref 3–11)
BUN: 36.2 mg/dL — ABNORMAL HIGH (ref 7.0–26.0)
CO2: 20 meq/L — AB (ref 22–29)
Calcium: 8.4 mg/dL (ref 8.4–10.4)
Chloride: 111 mEq/L — ABNORMAL HIGH (ref 98–109)
Creatinine: 2.9 mg/dL — ABNORMAL HIGH (ref 0.7–1.3)
EGFR: 22 mL/min/{1.73_m2} — AB (ref >90)
GLUCOSE: 91 mg/dL (ref 70–140)
POTASSIUM: 3.6 meq/L (ref 3.5–5.1)
SODIUM: 143 meq/L (ref 136–145)
Total Bilirubin: 0.35 mg/dL (ref 0.20–1.20)
Total Protein: 5.8 g/dL — ABNORMAL LOW (ref 6.4–8.3)

## 2015-02-18 MED ORDER — OXYCODONE HCL 5 MG PO TABS: 5.0000 mg | ORAL_TABLET | ORAL | Status: DC | PRN

## 2015-02-18 MED ORDER — SODIUM CHLORIDE 0.9 % IV SOLN
Freq: Once | INTRAVENOUS | Status: AC
  Administered 2015-02-18: 12:00:00 via INTRAVENOUS
  Filled 2015-02-18: qty 4

## 2015-02-18 MED ORDER — CYANOCOBALAMIN 1000 MCG/ML IJ SOLN
1000.0000 ug | Freq: Once | INTRAMUSCULAR | Status: AC
  Administered 2015-02-18: 1000 ug via SUBCUTANEOUS

## 2015-02-18 MED ORDER — BORTEZOMIB CHEMO SQ INJECTION 3.5 MG (2.5MG/ML)
1.3000 mg/m2 | Freq: Once | INTRAMUSCULAR | Status: AC
  Administered 2015-02-18: 2.5 mg via SUBCUTANEOUS
  Filled 2015-02-18: qty 2.5

## 2015-02-18 MED ORDER — SODIUM CHLORIDE 0.9 % IV SOLN
Freq: Once | INTRAVENOUS | Status: AC
  Administered 2015-02-18: 12:00:00 via INTRAVENOUS

## 2015-02-18 MED ORDER — SODIUM CHLORIDE 0.9 % IV SOLN
500.0000 mg | Freq: Once | INTRAVENOUS | Status: AC
  Administered 2015-02-18: 500 mg via INTRAVENOUS
  Filled 2015-02-18: qty 25

## 2015-02-18 MED ORDER — CYANOCOBALAMIN 1000 MCG/ML IJ SOLN
INTRAMUSCULAR | Status: AC
  Filled 2015-02-18: qty 1

## 2015-02-18 NOTE — Telephone Encounter (Signed)
per pof to sch pt appt-gave pt copy of avs-sent MW email to sch trmt-pt to get updated copy b4 leaving °

## 2015-02-18 NOTE — Telephone Encounter (Signed)
Per staff message and POF I have scheduled appts. Advised scheduler of appts. JMW  

## 2015-02-18 NOTE — Patient Instructions (Signed)
[facility] Discharge Instructions for Patients Receiving Chemotherapy  Today you received the following chemotherapy agents: Cytoxan and Velcade  To help prevent nausea and vomiting after your treatment, we encourage you to take your nausea medication as directed.    If you develop nausea and vomiting that is not controlled by your nausea medication, call the clinic.   BELOW ARE SYMPTOMS THAT SHOULD BE REPORTED IMMEDIATELY:  *FEVER GREATER THAN 100.5 F  *CHILLS WITH OR WITHOUT FEVER  NAUSEA AND VOMITING THAT IS NOT CONTROLLED WITH YOUR NAUSEA MEDICATION  *UNUSUAL SHORTNESS OF BREATH  *UNUSUAL BRUISING OR BLEEDING  TENDERNESS IN MOUTH AND THROAT WITH OR WITHOUT PRESENCE OF ULCERS  *URINARY PROBLEMS  *BOWEL PROBLEMS  UNUSUAL RASH Items with * indicate a potential emergency and should be followed up as soon as possible.  Feel free to call the clinic you have any questions or concerns. The clinic phone number is [phone].  Please show the CHEMO ALERT CARD at check-in to the Emergency Department and triage nurse.   

## 2015-02-18 NOTE — Progress Notes (Signed)
Ok to treat with CBC/CMET per MD Kale 

## 2015-02-23 ENCOUNTER — Encounter (HOSPITAL_COMMUNITY): Payer: Self-pay

## 2015-02-25 ENCOUNTER — Other Ambulatory Visit: Payer: Self-pay | Admitting: *Deleted

## 2015-02-25 ENCOUNTER — Ambulatory Visit (HOSPITAL_BASED_OUTPATIENT_CLINIC_OR_DEPARTMENT_OTHER): Payer: Self-pay | Admitting: Nurse Practitioner

## 2015-02-25 ENCOUNTER — Other Ambulatory Visit (HOSPITAL_BASED_OUTPATIENT_CLINIC_OR_DEPARTMENT_OTHER): Payer: Self-pay

## 2015-02-25 ENCOUNTER — Ambulatory Visit (HOSPITAL_COMMUNITY)
Admission: RE | Admit: 2015-02-25 | Discharge: 2015-02-25 | Disposition: A | Discharge status: Home / Self Care | Payer: Self-pay | Source: Ambulatory Visit | Attending: Nurse Practitioner | Admitting: Nurse Practitioner

## 2015-02-25 ENCOUNTER — Ambulatory Visit (HOSPITAL_BASED_OUTPATIENT_CLINIC_OR_DEPARTMENT_OTHER): Payer: Self-pay

## 2015-02-25 ENCOUNTER — Encounter: Payer: Self-pay | Admitting: Nurse Practitioner

## 2015-02-25 VITALS — BP 156/56 | HR 64 | Temp 97.6°F | Resp 18

## 2015-02-25 DIAGNOSIS — N183 Chronic kidney disease, stage 3 unspecified: Secondary | ICD-10-CM

## 2015-02-25 DIAGNOSIS — Z5111 Encounter for antineoplastic chemotherapy: Secondary | ICD-10-CM

## 2015-02-25 DIAGNOSIS — E858 Other amyloidosis: Secondary | ICD-10-CM

## 2015-02-25 DIAGNOSIS — M25551 Pain in right hip: Secondary | ICD-10-CM

## 2015-02-25 DIAGNOSIS — M4807 Spinal stenosis, lumbosacral region: Secondary | ICD-10-CM | POA: Insufficient documentation

## 2015-02-25 DIAGNOSIS — E8581 Light chain (AL) amyloidosis: Secondary | ICD-10-CM

## 2015-02-25 DIAGNOSIS — Z5112 Encounter for antineoplastic immunotherapy: Secondary | ICD-10-CM

## 2015-02-25 DIAGNOSIS — M4806 Spinal stenosis, lumbar region: Secondary | ICD-10-CM | POA: Insufficient documentation

## 2015-02-25 LAB — COMPREHENSIVE METABOLIC PANEL
ALBUMIN: 3.3 g/dL — AB (ref 3.5–5.0)
ALK PHOS: 78 U/L (ref 40–150)
ALT: 16 U/L (ref 0–55)
AST: 19 U/L (ref 5–34)
Anion Gap: 9 mEq/L (ref 3–11)
BILIRUBIN TOTAL: 0.41 mg/dL (ref 0.20–1.20)
BUN: 38.9 mg/dL — AB (ref 7.0–26.0)
CO2: 22 meq/L (ref 22–29)
CREATININE: 2.7 mg/dL — AB (ref 0.7–1.3)
Calcium: 8.7 mg/dL (ref 8.4–10.4)
Chloride: 108 mEq/L (ref 98–109)
EGFR: 24 mL/min/{1.73_m2} — AB (ref >90)
GLUCOSE: 103 mg/dL (ref 70–140)
Potassium: 3.5 mEq/L (ref 3.5–5.1)
SODIUM: 140 meq/L (ref 136–145)
TOTAL PROTEIN: 6.1 g/dL — AB (ref 6.4–8.3)

## 2015-02-25 LAB — CBC & DIFF AND RETIC
BASO%: 0.2 % (ref 0.0–2.0)
BASOS ABS: 0 10*3/uL (ref 0.0–0.1)
EOS ABS: 0.3 10*3/uL (ref 0.0–0.5)
EOS%: 4.6 % (ref 0.0–7.0)
HEMATOCRIT: 31.3 % — AB (ref 38.4–49.9)
HEMOGLOBIN: 10.7 g/dL — AB (ref 13.0–17.1)
IMMATURE RETIC FRACT: 2.2 % — AB (ref 3.00–10.60)
LYMPH#: 0.4 10*3/uL — AB (ref 0.9–3.3)
LYMPH%: 7.3 % — ABNORMAL LOW (ref 14.0–49.0)
MCH: 31.2 pg (ref 27.2–33.4)
MCHC: 34.2 g/dL (ref 32.0–36.0)
MCV: 91.3 fL (ref 79.3–98.0)
MONO#: 0.7 10*3/uL (ref 0.1–0.9)
MONO%: 13 % (ref 0.0–14.0)
NEUT%: 74.9 % (ref 39.0–75.0)
NEUTROS ABS: 4.2 10*3/uL (ref 1.5–6.5)
Platelets: 107 10*3/uL — ABNORMAL LOW (ref 140–400)
RBC: 3.43 10*6/uL — ABNORMAL LOW (ref 4.20–5.82)
RDW: 15 % — AB (ref 11.0–14.6)
RETIC %: 1.6 % (ref 0.80–1.80)
RETIC CT ABS: 54.88 10*3/uL (ref 34.80–93.90)
WBC: 5.6 10*3/uL (ref 4.0–10.3)

## 2015-02-25 MED ORDER — GABAPENTIN 250 MG/5ML PO SOLN: 200.0000 mg | Freq: Every day | ORAL | Status: DC

## 2015-02-25 MED ORDER — BORTEZOMIB CHEMO SQ INJECTION 3.5 MG (2.5MG/ML)
1.3000 mg/m2 | Freq: Once | INTRAMUSCULAR | Status: AC
  Administered 2015-02-25: 2.5 mg via SUBCUTANEOUS
  Filled 2015-02-25: qty 2.5

## 2015-02-25 MED ORDER — SODIUM CHLORIDE 0.9 % IV SOLN
Freq: Once | INTRAVENOUS | Status: AC
  Administered 2015-02-25: 12:00:00 via INTRAVENOUS
  Filled 2015-02-25: qty 4

## 2015-02-25 MED ORDER — SODIUM CHLORIDE 0.9 % IV SOLN
Freq: Once | INTRAVENOUS | Status: AC
  Administered 2015-02-25: 12:00:00 via INTRAVENOUS

## 2015-02-25 MED ORDER — CYANOCOBALAMIN 1000 MCG/ML IJ SOLN: 1000.0000 ug | Freq: Once | INTRAMUSCULAR | Status: DC

## 2015-02-25 MED ORDER — SODIUM CHLORIDE 0.9 % IV SOLN
500.0000 mg | Freq: Once | INTRAVENOUS | Status: AC
  Administered 2015-02-25: 500 mg via INTRAVENOUS
  Filled 2015-02-25: qty 25

## 2015-02-25 NOTE — Progress Notes (Signed)
Pt reports of R hip pain from a pinched nerve 2 weeks ago. Pt states that Dr. [name] gave him pain medicine that is not helping. Pt would like to consult with NP (Cyndee Berniece Salines) and evaluate him about his xray results from a few weeks ago. Notified Selena Lesser, NP to assess pt today.

## 2015-02-25 NOTE — Assessment & Plan Note (Signed)
Patient reports approximately two-week history of new onset right hip/buttock pain.  He states the pain is progressively increasing; and he sometimes limps.  He also states that the pain sometimes radiates to his right thigh and right calf.  He denies any numbness or tingling.  He also denies any urine or bowel incontinence.  Patient was seen in the emergency department on 02/15/2015 for the same complaint.  Right hip x-ray obtained at that time was negative.  Patient was once again evaluated here at the [facility] on 02/18/2015 for the same complaints; and right hip/pelvis x-ray obtained at that time was negative as well.  Will obtain a lumbar MRI with and without contrast later this afternoon for further evaluation.  Patient confirmed that he has both Vicodin and Percocet at home.  He also will be given a refill of his Neurontin today.

## 2015-02-25 NOTE — Assessment & Plan Note (Signed)
Patient presents to the [facility] today to receive cycle 20 of his Velcade/Cytoxan chemotherapy as well as his monthly Aranesp injection.  Patient states he's been doing fairly well; with the exception of the continued complaint of right hip pain.  Please see further notes regarding his hip pain.  Blood counts obtained today revealed a WBC of 5.6, ANC 4.2, hemogram and 10.7, and platelet count 107.  Patient will obtain a lumbar MRI with and without contrast later this evening for further evaluation of the chronic right hip pain.  Patient is scheduled to return on 03/01/2015 for a follow-up visit.  He is scheduled for labs and his next chemotherapy on 03/04/2015.

## 2015-02-25 NOTE — Progress Notes (Signed)
SYMPTOM MANAGEMENT CLINIC   HPI: Jose Garrett 64 y.o. male diagnosed with AL amyloidosis.  Currently undergoing Velcade/Cytoxan/Aranesp chemotherapy regimen.  Patient presented to the [facility] today to receive cycle 20 of his Velcade/Cytoxan/Aranesp regimen.   Patient reports approximately two-week history of new onset right hip/buttock pain.  He states the pain is progressively increasing; and he sometimes limps.  He also states that the pain sometimes radiates to his right thigh and right calf.  He denies any numbness or tingling.  He also denies any urine or bowel incontinence.  Patient was seen in the emergency department on 02/15/2015 for the same complaint.  Right hip x-ray obtained at that time was negative.  Patient was once again evaluated here at the [facility] on 02/18/2015 for the same complaints; and right hip/pelvis x-ray obtained at that time was negative as well.  Will obtain a lumbar MRI with and without contrast later this afternoon for further evaluation.  Patient confirmed that he has both Vicodin and Percocet at home.  He also will be given a refill of his Neurontin today.  HPI  ROS  Past Medical History  Diagnosis Date  . Hypertension Dx 2012  . Hyperlipidemia Dx 2012  . History of blood transfusion 09/08/2014    "got hematoma after renal biopsy & HgB dropped"  . Anemia Dx 2016  . Family history of adverse reaction to anesthesia     "my daughter can't take certain anesthesia agents" (09/08/2014)  . Enlarged heart 2015  . Chronic diastolic CHF (congestive heart failure), NYHA class 1 ([place])     /hospital problem list 09/08/2014  . Renal disorder     "there is a spot on my kidney" (09/08/2014)  . Chronic renal disease, stage III     /hospital problem list 09/08/2014    Past Surgical History  Procedure Laterality Date  . Renal biopsy, percutaneous Right 09/08/2014  . Tonsillectomy  ~ 1960  . Laparoscopic cholecystectomy  03/2011    has Essential  hypertension, benign; Dependent edema; DOE (dyspnea on exertion); Other malaise and fatigue; Vitamin D deficiency; Hypertriglyceridemia; Chronic renal disease, stage III; Hematuria; Proteinuria; Renal hematoma, right; Chronic diastolic heart failure, NYHA class 1 ([place]); Acute blood loss anemia; AL amyloidosis ([place]); Anemia of chronic disease; Abnormal bruising; and Hip pain on his problem list.    has No Known Allergies.    Medication List       This list is accurate as of: 02/25/15 12:33 PM.  Always use your most recent med list.               acyclovir 400 MG tablet  Commonly known as:  ZOVIRAX  Take 1 tablet (400 mg total) by mouth daily. (Dose adjusted for renal function)     amLODipine 10 MG tablet  Commonly known as:  NORVASC  Take 1 tablet (10 mg total) by mouth daily.     aspirin EC 81 MG tablet  Take 1 tablet (81 mg total) by mouth daily.     b complex vitamins capsule  Take 1 capsule by mouth daily.     B-12 COMPLIANCE INJECTION IJ  Inject 1 mL as directed every 30 (thirty) days.     cyclophosphamide 500 MG chemo injection  Commonly known as:  CYTOXAN  Inject 25 mLs (500 mg total) into the vein once a week.     dexamethasone 4 MG tablet  Commonly known as:  DECADRON  Take 5 tablets (20 mg total) by mouth  once a week. On Day 1,8,15 and 22. 30-64mns prior to cyclophosphamide     FLONASE 50 MCG/ACT nasal spray  Generic drug:  fluticasone  Place 1 spray into both nostrils daily.     furosemide 40 MG tablet  Commonly known as:  LASIX  80 mg Monday, Tuesday, Wednesay     gabapentin 250 MG/5ML solution  Commonly known as:  NEURONTIN  Take 4 mLs (200 mg total) by mouth at bedtime.     hydrALAZINE 50 MG tablet  Commonly known as:  APRESOLINE  TAKE 1 TABLET BY MOUTH 3 TIMES DAILY.     HYDROcodone-acetaminophen 5-325 MG tablet  Commonly known as:  NORCO/VICODIN  Take 1-2 tablets by mouth every 4 (four) hours as needed.     LORazepam 1 MG tablet  Commonly  known as:  ATIVAN  Take 1 tablet (1 mg total) by mouth every 8 (eight) hours as needed for anxiety (or nausea).     metoprolol 100 MG tablet  Commonly known as:  LOPRESSOR  TAKE 1 TABLET BY MOUTH 2 TIMES A DAY     ondansetron 8 MG tablet  Commonly known as:  ZOFRAN  Take 1 tablet (8 mg total) by mouth 2 (two) times daily as needed (Nausea or vomiting).     oxyCODONE 5 MG immediate release tablet  Commonly known as:  Oxy IR/ROXICODONE  Take 1 tablet (5 mg total) by mouth every 4 (four) hours as needed for severe pain.     prochlorperazine 10 MG tablet  Commonly known as:  COMPAZINE  Take 1 tablet (10 mg total) by mouth every 6 (six) hours as needed (Nausea or vomiting).     vitamin E 100 UNIT capsule  Take 100 Units by mouth every other day.         PHYSICAL EXAMINATION  Oncology Vitals 02/25/2015 02/18/2015  Height - 173 cm  Weight - 87.408 kg  Weight (lbs) - 192 lbs 11 oz  BMI (kg/m2) - 29.3 kg/m2  Temp 97.6 97.8  Pulse 64 60  Resp 18 18  SpO2 100 99  BSA (m2) - 2.05 m2   BP Readings from Last 2 Encounters:  02/25/15 156/56  02/18/15 137/63    Physical Exam  Constitutional: He is oriented to person, place, and time and well-developed, well-nourished, and in no distress.  HENT:  Head: Normocephalic and atraumatic.  Eyes: Conjunctivae and EOM are normal. Pupils are equal, round, and reactive to light. Right eye exhibits no discharge. Left eye exhibits no discharge. No scleral icterus.  Neck: Normal range of motion.  Pulmonary/Chest: Effort normal. No respiratory distress.  Musculoskeletal: Normal range of motion.  Neurological: He is alert and oriented to person, place, and time.  Psychiatric: Affect normal.  Nursing note and vitals reviewed.   LABORATORY DATA:. Appointment on 02/25/2015  Component Date Value Ref Range Status  . WBC 02/25/2015 5.6  4.0 - 10.3 10e3/uL Final  . NEUT# 02/25/2015 4.2  1.5 - 6.5 10e3/uL Final  . HGB 02/25/2015 10.7* 13.0 - 17.1  g/dL Final  . HCT 02/25/2015 31.3* 38.4 - 49.9 % Final  . Platelets 02/25/2015 107* 140 - 400 10e3/uL Final  . MCV 02/25/2015 91.3  79.3 - 98.0 fL Final  . MCH 02/25/2015 31.2  27.2 - 33.4 pg Final  . MCHC 02/25/2015 34.2  32.0 - 36.0 g/dL Final  . RBC 02/25/2015 3.43* 4.20 - 5.82 10e6/uL Final  . RDW 02/25/2015 15.0* 11.0 - 14.6 % Final  . lymph# 02/25/2015  0.4* 0.9 - 3.3 10e3/uL Final  . MONO# 02/25/2015 0.7  0.1 - 0.9 10e3/uL Final  . Eosinophils Absolute 02/25/2015 0.3  0.0 - 0.5 10e3/uL Final  . Basophils Absolute 02/25/2015 0.0  0.0 - 0.1 10e3/uL Final  . NEUT% 02/25/2015 74.9  39.0 - 75.0 % Final  . LYMPH% 02/25/2015 7.3* 14.0 - 49.0 % Final  . MONO% 02/25/2015 13.0  0.0 - 14.0 % Final  . EOS% 02/25/2015 4.6  0.0 - 7.0 % Final  . BASO% 02/25/2015 0.2  0.0 - 2.0 % Final  . Retic % 02/25/2015 1.60  0.80 - 1.80 % Final  . Retic Ct Abs 02/25/2015 54.88  34.80 - 93.90 10e3/uL Final  . Immature Retic Fract 02/25/2015 2.20* 3.00 - 10.60 % Final  . Sodium 02/25/2015 140  136 - 145 mEq/L Final  . Potassium 02/25/2015 3.5  3.5 - 5.1 mEq/L Final  . Chloride 02/25/2015 108  98 - 109 mEq/L Final  . CO2 02/25/2015 22  22 - 29 mEq/L Final  . Glucose 02/25/2015 103  70 - 140 mg/dl Final   Glucose reference range is for nonfasting patients. Fasting glucose reference range is 70- 100.  [name] BUN 02/25/2015 38.9* 7.0 - 26.0 mg/dL Final  . Creatinine 02/25/2015 2.7* 0.7 - 1.3 mg/dL Final  . Total Bilirubin 02/25/2015 0.41  0.20 - 1.20 mg/dL Final  . Alkaline Phosphatase 02/25/2015 78  40 - 150 U/L Final  . AST 02/25/2015 19  5 - 34 U/L Final  . ALT 02/25/2015 16  0 - 55 U/L Final  . Total Protein 02/25/2015 6.1* 6.4 - 8.3 g/dL Final  . Albumin 02/25/2015 3.3* 3.5 - 5.0 g/dL Final  . Calcium 02/25/2015 8.7  8.4 - 10.4 mg/dL Final  . Anion Gap 02/25/2015 9  3 - 11 mEq/L Final  . EGFR 02/25/2015 24* >90 ml/min/1.73 m2 Final   eGFR is calculated using the CKD-EPI Creatinine Equation (2009)      RADIOGRAPHIC STUDIES: No results found.  ASSESSMENT/PLAN:    Hip pain Patient reports approximately two-week history of new onset right hip/buttock pain.  He states the pain is progressively increasing; and he sometimes limps.  He also states that the pain sometimes radiates to his right thigh and right calf.  He denies any numbness or tingling.  He also denies any urine or bowel incontinence.  Patient was seen in the emergency department on 02/15/2015 for the same complaint.  Right hip x-ray obtained at that time was negative.  Patient was once again evaluated here at the [facility] on 02/18/2015 for the same complaints; and right hip/pelvis x-ray obtained at that time was negative as well.  Will obtain a lumbar MRI with and without contrast later this afternoon for further evaluation.  Patient confirmed that he has both Vicodin and Percocet at home.  He also will be given a refill of his Neurontin today.  AL amyloidosis ([place]) Patient presents to the [facility] today to receive cycle 20 of his Velcade/Cytoxan chemotherapy as well as his monthly Aranesp injection.  Patient states he's been doing fairly well; with the exception of the continued complaint of right hip pain.  Please see further notes regarding his hip pain.  Blood counts obtained today revealed a WBC of 5.6, ANC 4.2, hemogram and 10.7, and platelet count 107.  Patient will obtain a lumbar MRI with and without contrast later this evening for further evaluation of the chronic right hip pain.  Patient is scheduled to return  on 03/01/2015 for a follow-up visit.  He is scheduled for labs and his next chemotherapy on 03/04/2015.  Chronic renal disease, stage III Patient has history of chronic renal disease.  Creatinine has actually improved slightly from 2.9 down to 2.7.  Will continue to monitor closely.  Patient stated understanding of all instructions; and was in agreement with this plan of care. The patient knows  to call the clinic with any problems, questions or concerns.   Review/collaboration with Dr. [name] regarding all aspects of patient's visit today.   Total time spent with patient was 25 minutes;  with greater than 75 percent of that time spent in face to face counseling regarding patient's symptoms,  and coordination of care and follow up.  Disclaimer:This dictation was prepared with Dragon/digital dictation along with [employer]. Any transcriptional errors that result from this process are unintentional.  Drue Second, NP 02/25/2015

## 2015-02-25 NOTE — Patient Instructions (Signed)
[facility] Discharge Instructions for Patients Receiving Chemotherapy  Today you received the following chemotherapy agents: Cytoxan and Velcade  To help prevent nausea and vomiting after your treatment, we encourage you to take your nausea medication as directed.    If you develop nausea and vomiting that is not controlled by your nausea medication, call the clinic.   BELOW ARE SYMPTOMS THAT SHOULD BE REPORTED IMMEDIATELY:  *FEVER GREATER THAN 100.5 F  *CHILLS WITH OR WITHOUT FEVER  NAUSEA AND VOMITING THAT IS NOT CONTROLLED WITH YOUR NAUSEA MEDICATION  *UNUSUAL SHORTNESS OF BREATH  *UNUSUAL BRUISING OR BLEEDING  TENDERNESS IN MOUTH AND THROAT WITH OR WITHOUT PRESENCE OF ULCERS  *URINARY PROBLEMS  *BOWEL PROBLEMS  UNUSUAL RASH Items with * indicate a potential emergency and should be followed up as soon as possible.  Feel free to call the clinic you have any questions or concerns. The clinic phone number is [phone].  Please show the CHEMO ALERT CARD at check-in to the Emergency Department and triage nurse.   

## 2015-02-25 NOTE — Assessment & Plan Note (Signed)
Patient has history of chronic renal disease.  Creatinine has actually improved slightly from 2.9 down to 2.7.  Will continue to monitor closely.

## 2015-02-28 ENCOUNTER — Other Ambulatory Visit: Payer: Self-pay | Admitting: Hematology

## 2015-02-28 DIAGNOSIS — M25551 Pain in right hip: Secondary | ICD-10-CM

## 2015-02-28 DIAGNOSIS — E8581 Light chain (AL) amyloidosis: Secondary | ICD-10-CM

## 2015-02-28 MED ORDER — GABAPENTIN 250 MG/5ML PO SOLN: 300.0000 mg | Freq: Every day | ORAL | Status: DC

## 2015-02-28 MED ORDER — OXYCODONE HCL 5 MG PO TABS
5.0000 mg | ORAL_TABLET | ORAL | Status: DC | PRN
Start: 2015-02-28 — End: 2015-03-18

## 2015-03-01 ENCOUNTER — Encounter: Payer: Self-pay | Admitting: Hematology

## 2015-03-01 ENCOUNTER — Other Ambulatory Visit: Payer: Self-pay

## 2015-03-01 ENCOUNTER — Other Ambulatory Visit: Payer: Self-pay | Admitting: Internal Medicine

## 2015-03-01 ENCOUNTER — Telehealth: Payer: Self-pay | Admitting: *Deleted

## 2015-03-01 ENCOUNTER — Ambulatory Visit (HOSPITAL_BASED_OUTPATIENT_CLINIC_OR_DEPARTMENT_OTHER): Payer: Self-pay | Admitting: Hematology

## 2015-03-01 ENCOUNTER — Telehealth: Payer: Self-pay | Admitting: Hematology

## 2015-03-01 VITALS — BP 176/63 | HR 70 | Temp 98.0°F | Resp 18 | Ht 68.0 in | Wt 186.7 lb

## 2015-03-01 DIAGNOSIS — E858 Other amyloidosis: Secondary | ICD-10-CM

## 2015-03-01 DIAGNOSIS — M25559 Pain in unspecified hip: Secondary | ICD-10-CM

## 2015-03-01 DIAGNOSIS — R609 Edema, unspecified: Secondary | ICD-10-CM

## 2015-03-01 DIAGNOSIS — D649 Anemia, unspecified: Secondary | ICD-10-CM

## 2015-03-01 DIAGNOSIS — E8581 Light chain (AL) amyloidosis: Secondary | ICD-10-CM

## 2015-03-01 DIAGNOSIS — N189 Chronic kidney disease, unspecified: Secondary | ICD-10-CM

## 2015-03-01 NOTE — Telephone Encounter (Signed)
Per staff message and POF I have scheduled appts. Advised scheduler of appts. JMW  

## 2015-03-01 NOTE — Telephone Encounter (Signed)
per pof to sch pt appt-gave pt copy of avs-sent MW email to sch trmt-pt aware °

## 2015-03-04 ENCOUNTER — Other Ambulatory Visit (HOSPITAL_COMMUNITY)
Admission: RE | Admit: 2015-03-04 | Discharge: 2015-03-04 | Disposition: A | Discharge status: Home / Self Care | Payer: Self-pay | Source: Ambulatory Visit | Attending: Hematology | Admitting: Hematology

## 2015-03-04 ENCOUNTER — Ambulatory Visit (HOSPITAL_BASED_OUTPATIENT_CLINIC_OR_DEPARTMENT_OTHER): Payer: Self-pay

## 2015-03-04 VITALS — BP 146/63 | HR 66 | Resp 16

## 2015-03-04 DIAGNOSIS — Z5112 Encounter for antineoplastic immunotherapy: Secondary | ICD-10-CM

## 2015-03-04 DIAGNOSIS — E8581 Light chain (AL) amyloidosis: Secondary | ICD-10-CM

## 2015-03-04 DIAGNOSIS — E858 Other amyloidosis: Secondary | ICD-10-CM

## 2015-03-04 DIAGNOSIS — D638 Anemia in other chronic diseases classified elsewhere: Secondary | ICD-10-CM

## 2015-03-04 LAB — CBC & DIFF AND RETIC
BASO%: 0.4 % (ref 0.0–2.0)
Basophils Absolute: 0 10*3/uL (ref 0.0–0.1)
EOS%: 5.8 % (ref 0.0–7.0)
Eosinophils Absolute: 0.3 10*3/uL (ref 0.0–0.5)
HCT: 28.7 % — ABNORMAL LOW (ref 38.4–49.9)
HGB: 9.7 g/dL — ABNORMAL LOW (ref 13.0–17.1)
Immature Retic Fract: 2 % — ABNORMAL LOW (ref 3.00–10.60)
LYMPH%: 8.1 % — ABNORMAL LOW (ref 14.0–49.0)
MCH: 30.8 pg (ref 27.2–33.4)
MCHC: 33.8 g/dL (ref 32.0–36.0)
MCV: 91.1 fL (ref 79.3–98.0)
MONO#: 0.7 10*3/uL (ref 0.1–0.9)
MONO%: 13.5 % (ref 0.0–14.0)
NEUT%: 72.2 % (ref 39.0–75.0)
NEUTROS ABS: 3.8 10*3/uL (ref 1.5–6.5)
Platelets: 119 10*3/uL — ABNORMAL LOW (ref 140–400)
RBC: 3.15 10*6/uL — ABNORMAL LOW (ref 4.20–5.82)
RDW: 14.8 % — AB (ref 11.0–14.6)
RETIC %: 1.06 % (ref 0.80–1.80)
Retic Ct Abs: 33.39 10*3/uL — ABNORMAL LOW (ref 34.80–93.90)
WBC: 5.3 10*3/uL (ref 4.0–10.3)
lymph#: 0.4 10*3/uL — ABNORMAL LOW (ref 0.9–3.3)

## 2015-03-04 LAB — COMPREHENSIVE METABOLIC PANEL
ALBUMIN: 3.3 g/dL — AB (ref 3.5–5.0)
ALT: 17 U/L (ref 17–63)
ANION GAP: 11 (ref 5–15)
AST: 19 U/L (ref 15–41)
Alkaline Phosphatase: 80 U/L (ref 38–126)
BUN: 45 mg/dL — ABNORMAL HIGH (ref 6–20)
CO2: 23 mmol/L (ref 22–32)
Calcium: 8.6 mg/dL — ABNORMAL LOW (ref 8.9–10.3)
Chloride: 106 mmol/L (ref 101–111)
Creatinine, Ser: 3.43 mg/dL — ABNORMAL HIGH (ref 0.61–1.24)
GFR calc non Af Amer: 18 mL/min — ABNORMAL LOW (ref >60)
GFR, EST AFRICAN AMERICAN: 21 mL/min — AB (ref >60)
GLUCOSE: 89 mg/dL (ref 65–99)
POTASSIUM: 3.3 mmol/L — AB (ref 3.5–5.1)
SODIUM: 140 mmol/L (ref 135–145)
Total Bilirubin: 0.6 mg/dL (ref 0.3–1.2)
Total Protein: 6.3 g/dL — ABNORMAL LOW (ref 6.5–8.1)

## 2015-03-04 MED ORDER — BORTEZOMIB CHEMO SQ INJECTION 3.5 MG (2.5MG/ML)
1.3000 mg/m2 | Freq: Once | INTRAMUSCULAR | Status: AC
  Administered 2015-03-04: 2.5 mg via SUBCUTANEOUS
  Filled 2015-03-04: qty 2.5

## 2015-03-04 MED ORDER — ONDANSETRON HCL 40 MG/20ML IJ SOLN
Freq: Once | INTRAMUSCULAR | Status: AC
  Administered 2015-03-04: 11:00:00 via INTRAVENOUS
  Filled 2015-03-04: qty 4

## 2015-03-04 MED ORDER — DARBEPOETIN ALFA 100 MCG/0.5ML IJ SOSY
40.0000 ug | PREFILLED_SYRINGE | Freq: Once | INTRAMUSCULAR | Status: AC
  Administered 2015-03-04: 40 ug via SUBCUTANEOUS
  Filled 2015-03-04: qty 0.5

## 2015-03-04 MED ORDER — SODIUM CHLORIDE 0.9 % IV SOLN
Freq: Once | INTRAVENOUS | Status: AC
  Administered 2015-03-04: 11:00:00 via INTRAVENOUS

## 2015-03-04 MED ORDER — SODIUM CHLORIDE 0.9 % IV SOLN
500.0000 mg | Freq: Once | INTRAVENOUS | Status: AC
  Administered 2015-03-04: 500 mg via INTRAVENOUS
  Filled 2015-03-04: qty 25

## 2015-03-04 MED ORDER — CYANOCOBALAMIN 1000 MCG/ML IJ SOLN: 1000.0000 ug | Freq: Once | INTRAMUSCULAR | Status: DC

## 2015-03-04 NOTE — Progress Notes (Signed)
Creatinine 3.43 today, elevated, but within treatment parameter for this patient; K 3.3 and Calcium low at 8.6. OK to treat with velcade/cytoxan per Dr. Marin Olp.

## 2015-03-04 NOTE — Patient Instructions (Signed)
[facility] Discharge Instructions for Patients Receiving Chemotherapy  Today you received the following chemotherapy agents: Velcade and Cytoxan You also received Aranesp today  To help prevent nausea and vomiting after your treatment, we encourage you to take your nausea medication as prescribed.    If you develop nausea and vomiting that is not controlled by your nausea medication, call the clinic.   BELOW ARE SYMPTOMS THAT SHOULD BE REPORTED IMMEDIATELY:  *FEVER GREATER THAN 100.5 F  *CHILLS WITH OR WITHOUT FEVER  NAUSEA AND VOMITING THAT IS NOT CONTROLLED WITH YOUR NAUSEA MEDICATION  *UNUSUAL SHORTNESS OF BREATH  *UNUSUAL BRUISING OR BLEEDING  TENDERNESS IN MOUTH AND THROAT WITH OR WITHOUT PRESENCE OF ULCERS  *URINARY PROBLEMS  *BOWEL PROBLEMS  UNUSUAL RASH Items with * indicate a potential emergency and should be followed up as soon as possible.  Feel free to call the clinic you have any questions or concerns. The clinic phone number is [phone].  Please show the [facility] at check-in to the Emergency Department and triage nurse.

## 2015-03-09 LAB — SPEP & IFE WITH QIG
ABNORMAL PROTEIN BAND1: 0.1 g/dL
ALPHA-2-GLOBULIN: 1.1 g/dL — AB (ref 0.5–0.9)
Albumin ELP: 3.2 g/dL — ABNORMAL LOW (ref 3.8–4.8)
Alpha-1-Globulin: 0.5 g/dL — ABNORMAL HIGH (ref 0.2–0.3)
BETA GLOBULIN: 0.3 g/dL — AB (ref 0.4–0.6)
Beta 2: 0.3 g/dL (ref 0.2–0.5)
Gamma Globulin: 0.3 g/dL — ABNORMAL LOW (ref 0.8–1.7)
IgA: 43 mg/dL — ABNORMAL LOW (ref 68–379)
IgG (Immunoglobin G), Serum: 257 mg/dL — ABNORMAL LOW (ref 650–1600)
IgM, Serum: 54 mg/dL (ref 41–251)
Total Protein, Serum Electrophoresis: 5.6 g/dL — ABNORMAL LOW (ref 6.1–8.1)

## 2015-03-09 LAB — KAPPA/LAMBDA LIGHT CHAINS
KAPPA FREE LGHT CHN: 3.54 mg/dL — AB (ref 0.33–1.94)
Kappa:Lambda Ratio: 1.41 (ref 0.26–1.65)
LAMBDA FREE LGHT CHN: 2.51 mg/dL (ref 0.57–2.63)

## 2015-03-11 ENCOUNTER — Other Ambulatory Visit (HOSPITAL_BASED_OUTPATIENT_CLINIC_OR_DEPARTMENT_OTHER): Payer: Self-pay

## 2015-03-11 ENCOUNTER — Ambulatory Visit (HOSPITAL_BASED_OUTPATIENT_CLINIC_OR_DEPARTMENT_OTHER): Payer: Self-pay

## 2015-03-11 VITALS — BP 133/61 | HR 70 | Temp 98.3°F | Resp 18

## 2015-03-11 DIAGNOSIS — Z5111 Encounter for antineoplastic chemotherapy: Secondary | ICD-10-CM

## 2015-03-11 DIAGNOSIS — E8581 Light chain (AL) amyloidosis: Secondary | ICD-10-CM

## 2015-03-11 DIAGNOSIS — Z5112 Encounter for antineoplastic immunotherapy: Secondary | ICD-10-CM

## 2015-03-11 DIAGNOSIS — E858 Other amyloidosis: Secondary | ICD-10-CM

## 2015-03-11 LAB — CBC WITH DIFFERENTIAL/PLATELET
BASO%: 0.5 % (ref 0.0–2.0)
Basophils Absolute: 0 10*3/uL (ref 0.0–0.1)
EOS%: 7.2 % — AB (ref 0.0–7.0)
Eosinophils Absolute: 0.4 10*3/uL (ref 0.0–0.5)
HEMATOCRIT: 30.3 % — AB (ref 38.4–49.9)
HEMOGLOBIN: 9.9 g/dL — AB (ref 13.0–17.1)
LYMPH#: 0.4 10*3/uL — AB (ref 0.9–3.3)
LYMPH%: 7.1 % — ABNORMAL LOW (ref 14.0–49.0)
MCH: 30.1 pg (ref 27.2–33.4)
MCHC: 32.8 g/dL (ref 32.0–36.0)
MCV: 91.8 fL (ref 79.3–98.0)
MONO#: 0.7 10*3/uL (ref 0.1–0.9)
MONO%: 11.8 % (ref 0.0–14.0)
NEUT#: 4.5 10*3/uL (ref 1.5–6.5)
NEUT%: 73.4 % (ref 39.0–75.0)
Platelets: 146 10*3/uL (ref 140–400)
RBC: 3.3 10*6/uL — ABNORMAL LOW (ref 4.20–5.82)
RDW: 15.7 % — AB (ref 11.0–14.6)
WBC: 6.1 10*3/uL (ref 4.0–10.3)

## 2015-03-11 LAB — COMPREHENSIVE METABOLIC PANEL
ALBUMIN: 2.8 g/dL — AB (ref 3.5–5.0)
ALK PHOS: 75 U/L (ref 40–150)
ALT: 12 U/L (ref 0–55)
ANION GAP: 12 meq/L — AB (ref 3–11)
AST: 13 U/L (ref 5–34)
BILIRUBIN TOTAL: 0.43 mg/dL (ref 0.20–1.20)
BUN: 34.5 mg/dL — ABNORMAL HIGH (ref 7.0–26.0)
CO2: 19 mEq/L — ABNORMAL LOW (ref 22–29)
Calcium: 8.9 mg/dL (ref 8.4–10.4)
Chloride: 107 mEq/L (ref 98–109)
Creatinine: 3.1 mg/dL (ref 0.7–1.3)
EGFR: 20 mL/min/{1.73_m2} — AB (ref >90)
Glucose: 94 mg/dl (ref 70–140)
Potassium: 3.6 mEq/L (ref 3.5–5.1)
Sodium: 138 mEq/L (ref 136–145)
TOTAL PROTEIN: 6.1 g/dL — AB (ref 6.4–8.3)

## 2015-03-11 MED ORDER — CYANOCOBALAMIN 1000 MCG/ML IJ SOLN
INTRAMUSCULAR | Status: AC
  Filled 2015-03-11: qty 1

## 2015-03-11 MED ORDER — BORTEZOMIB CHEMO SQ INJECTION 3.5 MG (2.5MG/ML)
1.3000 mg/m2 | Freq: Once | INTRAMUSCULAR | Status: AC
  Administered 2015-03-11: 2.5 mg via SUBCUTANEOUS
  Filled 2015-03-11: qty 2.5

## 2015-03-11 MED ORDER — SODIUM CHLORIDE 0.9 % IV SOLN
Freq: Once | INTRAVENOUS | Status: AC
  Administered 2015-03-11: 10:00:00 via INTRAVENOUS
  Filled 2015-03-11: qty 4

## 2015-03-11 MED ORDER — SODIUM CHLORIDE 0.9 % IV SOLN
Freq: Once | INTRAVENOUS | Status: AC
  Administered 2015-03-11: 10:00:00 via INTRAVENOUS

## 2015-03-11 MED ORDER — CYANOCOBALAMIN 1000 MCG/ML IJ SOLN
1000.0000 ug | Freq: Once | INTRAMUSCULAR | Status: AC
  Administered 2015-03-11: 1000 ug via SUBCUTANEOUS

## 2015-03-11 MED ORDER — SODIUM CHLORIDE 0.9 % IV SOLN
500.0000 mg | Freq: Once | INTRAVENOUS | Status: AC
  Administered 2015-03-11: 500 mg via INTRAVENOUS
  Filled 2015-03-11: qty 25

## 2015-03-11 NOTE — Patient Instructions (Signed)
[facility] Discharge Instructions for Patients Receiving Chemotherapy  Today you received the following chemotherapy agents:  Velcade & Cytoxan  To help prevent nausea and vomiting after your treatment, we encourage you to take your nausea medication.   If you develop nausea and vomiting that is not controlled by your nausea medication, call the clinic.   BELOW ARE SYMPTOMS THAT SHOULD BE REPORTED IMMEDIATELY:  *FEVER GREATER THAN 100.5 F  *CHILLS WITH OR WITHOUT FEVER  NAUSEA AND VOMITING THAT IS NOT CONTROLLED WITH YOUR NAUSEA MEDICATION  *UNUSUAL SHORTNESS OF BREATH  *UNUSUAL BRUISING OR BLEEDING  TENDERNESS IN MOUTH AND THROAT WITH OR WITHOUT PRESENCE OF ULCERS  *URINARY PROBLEMS  *BOWEL PROBLEMS  UNUSUAL RASH Items with * indicate a potential emergency and should be followed up as soon as possible.  Feel free to call the clinic you have any questions or concerns. The clinic phone number is [phone].  Please show the CHEMO ALERT CARD at check-in to the Emergency Department and triage nurse.   

## 2015-03-15 NOTE — Progress Notes (Signed)
.    Hematology oncology clinic note  Date of service 02/18/2015    Patient Care Team: [name], MD as PCP - General (Family Medicine) [name], MD as PCP - Hematology/Oncology (Hematology and Oncology) [name], MD as Consulting Physician (Nephrology)  CHIEF COMPLAINTS: followup for AL amyloidosis.  HISTORY OF PRESENTING ILLNESS: Please see my initial consultation for details on presentation.  INTERVAL HISTORY  Jose Garrett is here for his scheduled 2 week follow-up. He has completed 18 weeks of treatment and has been doing well. Has been having new right gluteal pain and was in the emergency room recently. X-ray of the pelvis and hip done today shows no evidence of fracture or dislocation. No arthropathy or focal bone abnormality. No evidence of avascular necrosis of the femoral head. No acute treatment toxicities. Otherwise doing okay.    MEDICAL HISTORY:  Past Medical History  Diagnosis Date  . Hypertension Dx 2012  . Hyperlipidemia Dx 2012  . History of blood transfusion 09/08/2014    "got hematoma after renal biopsy & HgB dropped"  . Anemia Dx 2016  . Family history of adverse reaction to anesthesia     "my daughter can't take certain anesthesia agents" (09/08/2014)  . Enlarged heart 2015  . Chronic diastolic CHF (congestive heart failure), NYHA class 1 (HCC)     /hospital problem list 09/08/2014  . Renal disorder     "there is a spot on my kidney" (09/08/2014)  . Chronic renal disease, stage III     /hospital problem list 09/08/2014    SURGICAL HISTORY: Past Surgical History  Procedure Laterality Date  . Renal biopsy, percutaneous Right 09/08/2014  . Tonsillectomy  ~ 1960  . Laparoscopic cholecystectomy  03/2011    SOCIAL HISTORY: Social History   Social History  . Marital Status: Divorced    Spouse Name: N/A  . Number of Children: N/A  . Years of Education: N/A   Occupational History  . Not on file.   Social History Main  Topics  . Smoking status: Former Smoker -- 2.00 packs/day for 40 years    Types: Cigarettes    Quit date: 03/19/2011  . Smokeless tobacco: Never Used  . Alcohol Use: No  . Drug Use: No     Comment: 09/08/2014 "I have used marijuana till the 1990's". Notes that he quit 15 yrs ago  . Sexual Activity: Not Currently   Other Topics Concern  . Not on file   Social History Narrative   Previously worked Education officer, [employer]. Lives with his daughter and her finance.  FAMILY HISTORY: Family History  Problem Relation Age of Onset  . Hypertension Mother   . Diabetes Mother   . Heart disease Mother   . Skin cancer Mother   . Hypertension Father   . Diabetes Father   . Diabetes Sister     ALLERGIES:  has No Known Allergies.  MEDICATIONS:  Current Outpatient Prescriptions  Medication Sig Dispense Refill  . acyclovir (ZOVIRAX) 400 MG tablet Take 1 tablet (400 mg total) by mouth daily. (Dose adjusted for renal function) 60 tablet 3  . amLODipine (NORVASC) 10 MG tablet Take 1 tablet (10 mg total) by mouth daily. 30 tablet 3  . aspirin EC 81 MG tablet Take 1 tablet (81 mg total) by mouth daily. 30 tablet 11  . Cyanocobalamin (B-12 COMPLIANCE INJECTION IJ) Inject 1 mL as directed every 30 (thirty) days.    . cyclophosphamide (CYTOXAN) 500 MG  chemo injection Inject 25 mLs (500 mg total) into the vein once a week. 1 each   . dexamethasone (DECADRON) 4 MG tablet Take 5 tablets (20 mg total) by mouth once a week. On Day 1,8,15 and 22. 30-43mns prior to cyclophosphamide 40 tablet 1  . FLONASE 50 MCG/ACT nasal spray Place 1 spray into both nostrils daily. (Patient not taking: Reported on 02/15/2015) 15.8 g 0  . furosemide (LASIX) 40 MG tablet 80 mg Monday, Tuesday, Wednesay (Patient taking differently: Take 80 mg by mouth 2 (two) times daily. ) 90 tablet 1  . gabapentin (NEURONTIN) 250 MG/5ML solution Take 6 mLs (300 mg total) by mouth at bedtime. May increased to '400mg'$   po HS if still having neuropathic pain 240 mL 1  . hydrALAZINE (APRESOLINE) 50 MG tablet TAKE 1 TABLET BY MOUTH 3 TIMES DAILY. 120 tablet 2  . LORazepam (ATIVAN) 1 MG tablet Take 1 tablet (1 mg total) by mouth every 8 (eight) hours as needed for anxiety (or nausea). 30 tablet 0  . metoprolol (LOPRESSOR) 100 MG tablet TAKE 1 TABLET BY MOUTH 2 TIMES A DAY 60 tablet 3  . ondansetron (ZOFRAN) 8 MG tablet Take 1 tablet (8 mg total) by mouth 2 (two) times daily as needed (Nausea or vomiting). 30 tablet 1  . oxyCODONE (OXY IR/ROXICODONE) 5 MG immediate release tablet Take 1 tablet (5 mg total) by mouth every 4 (four) hours as needed for severe pain. 30 tablet 0  . prochlorperazine (COMPAZINE) 10 MG tablet Take 1 tablet (10 mg total) by mouth every 6 (six) hours as needed (Nausea or vomiting). 30 tablet 1  . vitamin E 100 UNIT capsule Take 100 Units by mouth every other day.      No current facility-administered medications for this visit.    REVIEW OF SYSTEMS:   Constitutional: Denies fevers, chills or abnormal night sweats Eyes: Denies blurriness of vision, double vision or watery eyes Ears, nose, mouth, throat, and face: Denies mucositis or sore throat Respiratory: Denies cough, dyspnea or wheezes Cardiovascular: Denies palpitation, chest discomfort or lower extremity swelling Gastrointestinal:  Denies nausea, heartburn or change in bowel habits Skin: Denies abnormal skin rashes Lymphatics: Denies new lymphadenopathy or easy bruising Neurological:Denies numbness, tingling or new weaknesses Behavioral/Psych: Mood is stable, no new changes  All other systems were reviewed with the patient and are negative.   PHYSICAL EXAMINATION: ECOG PERFORMANCE STATUS: 2 - Symptomatic, <50% confined to bed  Filed Vitals:   02/18/15 0934  BP: 137/63  Pulse: 60  Temp: 97.8 F (36.6 C)  Resp: 18   Filed Weights   02/18/15 0934  Weight: 192 lb 11.2 oz (87.408 kg)    GENERAL:alert, no distress ,  appears to be in good spirits and is smiling and quite verbal . SKIN: significant conjunctival pallor noted.  EYES: nl EOM, PERL OROPHARYNX:no exudate, no erythema and lips, buccal mucosa, and tongue normal  NECK: no JVD LYMPH:  no palpable lymphadenopathy in the cervical, axillary or inguinal LUNGS: clear to auscultation and percussion with normal breathing effort HEART: regular rate & rhythm and 2/6 SM aortic area ABDOMEN:abdomen soft, non-tender and normal bowel sounds Musculoskeletal: B/l 1+ pitting pedal edema in the left leg and 2+ pedal edema on the right leg. No Tenderness. No redness.  PSYCH: alert & oriented x 3 with fluent speech NEURO: no focal motor/sensory deficits  LABORATORY DATA:  Component     Latest Ref Rng 02/15/2015  WBC     4.0 - 10.3  10e3/uL 3.3 (L)  RBC     4.20 - 5.82 10e6/uL 3.46 (L)  Hemoglobin     13.0 - 17.1 g/dL 55.3 (L)  HCT     36.1 - 49.9 % 32.0 (L)  MCV     79.3 - 98.0 fL 92.5  MCH     27.2 - 33.4 pg 30.6  MCHC     32.0 - 36.0 g/dL 45.9  RDW     68.0 - 87.4 % 15.4  Platelets     140 - 400 10e3/uL 98 (L)  Neutrophils      71  NEUT#     1.5 - 6.5 10e3/uL 2.4  Lymphocytes      12  Lymphocyte #     0.7 - 4.0 K/uL 0.4 (L)  Monocytes Relative      14  Monocyte #     0.1 - 1.0 K/uL 0.5  Eosinophil      2  Eosinophils Absolute     0.0 - 0.5 10e3/uL 0.1  Basophil      1  Basophils Absolute     0.0 - 0.1 10e3/uL 0.0  WBC Morphology      MILD LEFT SHIFT (1-5% METAS, OCC MYELO, OCC BANDS)  lymph#     0.9 - 3.3 10e3/uL   MONO#     0.1 - 0.9 10e3/uL   NEUT%     39.0 - 75.0 %   LYMPH%     14.0 - 49.0 %   MONO%     0.0 - 14.0 %   EOS%     0.0 - 7.0 %   BASO%     0.0 - 2.0 %   nRBC     0 - 0 %   Sodium     136 - 145 mEq/L 141  Potassium     3.5 - 5.1 mEq/L 3.2 (L)  Chloride     98 - 109 mEq/L 108  CO2     22 - 29 mEq/L 25  Glucose     70 - 140 mg/dl 921 (H)  BUN     7.0 - 26.0 mg/dL 50 (H)  Creatinine     0.7 - 1.3  mg/dL 1.02 (H)  Calcium     8.4 - 10.4 mg/dL 8.8 (L)  Total Protein     6.4 - 8.3 g/dL 6.3 (L)  Albumin     3.5 - 5.0 g/dL 3.7  AST     5 - 34 U/L 20  ALT     0 - 55 U/L 21  Alkaline Phosphatase     40 - 150 U/L 62  Total Bilirubin     0.20 - 1.20 mg/dL 0.5  EGFR (Non-African Amer.)     >60 mL/min 22 (L)  EGFR (African American)     >60 mL/min 25 (L)  Anion gap     3 - 11 mEq/L 8  EGFR     >90 ml/min/1.73 m2    Component     Latest Ref Rng 02/18/2015  WBC     4.0 - 10.3 10e3/uL 4.5  RBC     4.20 - 5.82 10e6/uL 3.31 (L)  Hemoglobin     13.0 - 17.1 g/dL 83.9 (L)  HCT     14.1 - 49.9 % 30.6 (L)  MCV     79.3 - 98.0 fL 92.4  MCH     27.2 - 33.4 pg 30.5  MCHC     32.0 - 36.0  g/dL 33.0  RDW     11.0 - 14.6 % 15.2 (H)  Platelets     140 - 400 10e3/uL 99 (L)  Neutrophils        NEUT#     1.5 - 6.5 10e3/uL 3.3  Lymphocytes        Lymphocyte #     0.7 - 4.0 K/uL   Monocytes Relative        Monocyte #     0.1 - 1.0 K/uL   Eosinophil        Eosinophils Absolute     0.0 - 0.5 10e3/uL 0.2  Basophil        Basophils Absolute     0.0 - 0.1 10e3/uL 0.0  WBC Morphology        lymph#     0.9 - 3.3 10e3/uL 0.5 (L)  MONO#     0.1 - 0.9 10e3/uL 0.4  NEUT%     39.0 - 75.0 % 73.5  LYMPH%     14.0 - 49.0 % 11.5 (L)  MONO%     0.0 - 14.0 % 9.7  EOS%     0.0 - 7.0 % 5.1  BASO%     0.0 - 2.0 % 0.2  nRBC     0 - 0 % 0  Sodium     136 - 145 mEq/L 143  Potassium     3.5 - 5.1 mEq/L 3.6  Chloride     98 - 109 mEq/L 111 (H)  CO2     22 - 29 mEq/L 20 (L)  Glucose     70 - 140 mg/dl 91  BUN     7.0 - 26.0 mg/dL 36.2 (H)  Creatinine     0.7 - 1.3 mg/dL 2.9 (H)  Calcium     8.4 - 10.4 mg/dL 8.4  Total Protein     6.4 - 8.3 g/dL 5.8 (L)  Albumin     3.5 - 5.0 g/dL 3.3 (L)  AST     5 - 34 U/L 17  ALT     0 - 55 U/L 10  Alkaline Phosphatase     40 - 150 U/L 67  Total Bilirubin     0.20 - 1.20 mg/dL 0.35  EGFR (Non-African Amer.)     >60 mL/min     EGFR (African American)     >60 mL/min   Anion gap     3 - 11 mEq/L 12 (H)  EGFR     >90 ml/min/1.73 m2 22 (L)   RADIOGRAPHIC STUDIES: I have personally reviewed the radiological images as listed and agreed with the findings in the report.  Dg Hip Unilat With Pelvis 2-3 Views Right  02/18/2015  CLINICAL DATA:  Right hip pain.  On steroids. EXAM: DG HIP (WITH OR WITHOUT PELVIS) 2-3V RIGHT COMPARISON:  None. FINDINGS: There is no evidence of hip fracture or dislocation. There is no evidence of arthropathy or other focal bone abnormality. No evidence of AVN femoral head. IMPRESSION: Negative. Electronically Signed   By: Franchot Gallo M.D.   On: 02/18/2015 15:39   Echocardiogram 10/07/2014  Study Conclusions  - Left ventricle: The cavity size was normal. Wall thickness was increased in a pattern of moderate LVH. There was mild focal basal hypertrophy of the septum. Systolic function was normal. The estimated ejection fraction was in the range of 55% to 60%. Wall motion was normal; there were no regional wall motion abnormalities. Features are consistent with a  pseudonormal left ventricular filling pattern, with concomitant abnormal relaxation and increased filling pressure (grade 2 diastolic dysfunction). - Aortic valve: There was very mild stenosis. There was mild regurgitation. Valve area (VTI): 2.67 cm^2. Valve area (Vmax): 2.49 cm^2. Valve area (Vmean): 2.3 cm^2. - Mitral valve: There was mild regurgitation.  Impressions:  - Normal LV function; grade 2 diastolic dysfunction; mild to moderate LVH with proximal septal thickening; calcified aortic valve with very mild AS (mean gradient 10 mmHg) and mild AI; mild Jose; trace TR. Global longitudinal strain - 17%.   ASSESSMENT & PLAN:    Jose Garrett is very pleasant 65 yo caucasian male with   1) AL Amyloidosis noted on recent renal biopsy with nephrotic range proteinuria on urinalysis and some lambda free  light chains on serum and urinary IFE . On diagnosis:  SPEP with small amount of M-spike 0.3  Echo showed no systolic dysfunction but grade 2 diastolic dysfunction. [facility] cardiac staging 1 as per troponin and BNP criteria. Bone marrow with no overt evidence of multiple myeloma. PET/CT scan showed no evidence of bony lesions or lymphadenopathy . Urine showed about 15 g of protein in 24 hours   After 1 month of treatment the M-spike is down to 0.1gm/dl and urinary total protein is down to about 7.6g/24hours  After 2 months of treatment: M-spike in 0.1g/dl lambda free light chains improved and urinary total protein down to about 4 g per 24 hours With no monoclonal free light chains in the urine.  After 3 months of treatment: M spike still 0.1g/dl, difference in serum free light chains less than 4, urine protein down to 7222 mg per 24 hours which is less than 50% of the initial suggesting good renal response. Overall patient appears to also have partial to very good partial response based on his systemic markers of AL amyloidosis.  After 4 months of treatment: M protein spike 0.1g/dl ..still with significant proteinuria 9.5g/dl  Plan -Labs stable and no prohibitive treatment toxicities -We'll continue current treatment if tolerated up to maximum response usually up to 6 months. -He has been seen at [facility] for consideration of an autologous Hematopoietic stem cell transplant and will be planning follow-up with them to make a final decision. -If the patient decides not to proceed with auto HSCT will switch to maintenance treatment after 6 months of treatment.  2) multifactorial anemia - likely multifactorial -from AL amyloidosis, anemia from CKD, acute blood loss anemia due to large perinephric hematoma related to recent renal bx on 09/08/2014.  Stable with darbepoetin. 3) CKD -related to hypertension and AL Amyloidosis. Creatinine is 2.8 today and is improved from his previous  high of 3.5. Decreasing proteinuria 4) Feet tingling and numbness ? Related to AL Amyloidosis vs Velcade vs B12 deficiency stable. Started taking gabapentin 12/16/2014. 5)Bilateral pedal edema right more than left about the same as last time despite increasing his Lasix and decreased dexamethasone. Ultrasound of bilateral lower extremity is negative for DVT. 6) acute bacterial sinusitis.-Resolved with allergic sinusitis.  Plan -Recommended leg elevation and Jobst stockings -Continue current dose of Lasix with close monitoring of daily weights. -Continue darbepoetin 40 g every 28 days -next dose today -Continue subQ  B12 daily 28 days  -cyclophosphamide to 500 mg weekly and continue Velcade and dexamethasone ('20mg'$  qweekly). -Patient will get CBC, CMP weekly and myeloma labs every monthly.  -Referral to [facility] for autologous stem cell transplant evaluation if allowed by insurance. -increased neurontin  to '300mg'$  po HS for neuropathy.  -Monitor blood pressure with darbepoetin. -Flonase given for allergic sinusitis. -continue on low-dose aspirin for VTE prophylaxis given his high risk of venous thromboembolism in the setting of nephrotic syndrome.  -Continue close f/u with PCP and nephrology.  Return to care with Dr. [name] in 2 weeks. For further management.  All questions were answered. The patient knows to call the clinic with any problems, questions or concerns. I spent 15 minutes counseling the patient face to face. The total time spent in the appointment was 20 minutes and more than 50% was on counseling.   Sullivan Lone MD [place] Hematology/Oncology Physician [facility]  (Office):       [phone] (Work cell):  [phone] (Fax):           [phone]

## 2015-03-15 NOTE — Progress Notes (Signed)
.    Hematology oncology clinic note  Date of service .03/01/2015  Patient Care Team: [name], MD as PCP - General (Family Medicine) [name], MD as PCP - Hematology/Oncology (Hematology and Oncology) [name], MD as Consulting Physician (Nephrology)  CHIEF COMPLAINTS: followup for AL amyloidosis.  HISTORY OF PRESENTING ILLNESS: Please see my initial consultation for details on presentation.  INTERVAL HISTORY  Jose. Dutch Quint is here for his scheduled 2 week follow-up. He has completed 20 weeks of treatment and has been doing well. No new acute symptoms. Neuropathy symptoms improved with increased dose of gabapentin. Leg swelling a little better. Still with some hip pain but somewhat better. Notes that it worsens when he walks more otherwise been stable. Had an MRI of the lumbar Garrett that showed multilevel spondylosis of the cervical Garrett with several levels of foraminal stenosis. It appears the pain worsened when he helped one of his friends move and picked up some heavy things.   MEDICAL HISTORY:  Past Medical History  Diagnosis Date  . Hypertension Dx 2012  . Hyperlipidemia Dx 2012  . History of blood transfusion 09/08/2014    "got hematoma after renal biopsy & HgB dropped"  . Anemia Dx 2016  . Family history of adverse reaction to anesthesia     "my daughter can't take certain anesthesia agents" (09/08/2014)  . Enlarged heart 2015  . Chronic diastolic CHF (congestive heart failure), NYHA class 1 (HCC)     /hospital problem list 09/08/2014  . Renal disorder     "there is a spot on my kidney" (09/08/2014)  . Chronic renal disease, stage III     /hospital problem list 09/08/2014    SURGICAL HISTORY: Past Surgical History  Procedure Laterality Date  . Renal biopsy, percutaneous Right 09/08/2014  . Tonsillectomy  ~ 1960  . Laparoscopic cholecystectomy  03/2011    SOCIAL HISTORY: Social History   Social History  . Marital Status: Divorced   Spouse Name: N/A  . Number of Children: N/A  . Years of Education: N/A   Occupational History  . Not on file.   Social History Main Topics  . Smoking status: Former Smoker -- 2.00 packs/day for 40 years    Types: Cigarettes    Quit date: 03/19/2011  . Smokeless tobacco: Never Used  . Alcohol Use: No  . Drug Use: No     Comment: 09/08/2014 "I have used marijuana till the 1990's". Notes that he quit 15 yrs ago  . Sexual Activity: Not Currently   Other Topics Concern  . Not on file   Social History Narrative   Previously worked Education officer, [employer]. Lives with his daughter and her finance.  FAMILY HISTORY: Family History  Problem Relation Age of Onset  . Hypertension Mother   . Diabetes Mother   . Heart disease Mother   . Skin cancer Mother   . Hypertension Father   . Diabetes Father   . Diabetes Sister     ALLERGIES:  has No Known Allergies.  MEDICATIONS:  Current Outpatient Prescriptions  Medication Sig Dispense Refill  . acyclovir (ZOVIRAX) 400 MG tablet Take 1 tablet (400 mg total) by mouth daily. (Dose adjusted for renal function) 60 tablet 3  . amLODipine (NORVASC) 10 MG tablet Take 1 tablet (10 mg total) by mouth daily. 30 tablet 3  . aspirin EC 81 MG tablet Take 1 tablet (81 mg total) by mouth daily. 30 tablet 11  . Cyanocobalamin (B-12  COMPLIANCE INJECTION IJ) Inject 1 mL as directed every 30 (thirty) days.    . cyclophosphamide (CYTOXAN) 500 MG chemo injection Inject 25 mLs (500 mg total) into the vein once a week. 1 each   . dexamethasone (DECADRON) 4 MG tablet Take 5 tablets (20 mg total) by mouth once a week. On Day 1,8,15 and 22. 30-59mns prior to cyclophosphamide 40 tablet 1  . FLONASE 50 MCG/ACT nasal spray Place 1 spray into both nostrils daily. (Patient not taking: Reported on 02/15/2015) 15.8 g 0  . furosemide (LASIX) 40 MG tablet 80 mg Monday, Tuesday, Wednesay (Patient taking differently: Take 80 mg by mouth 2 (two) times  daily. ) 90 tablet 1  . gabapentin (NEURONTIN) 250 MG/5ML solution Take 6 mLs (300 mg total) by mouth at bedtime. May increased to '400mg'$  po HS if still having neuropathic pain 240 mL 1  . hydrALAZINE (APRESOLINE) 50 MG tablet TAKE 1 TABLET BY MOUTH 3 TIMES DAILY. 120 tablet 2  . LORazepam (ATIVAN) 1 MG tablet Take 1 tablet (1 mg total) by mouth every 8 (eight) hours as needed for anxiety (or nausea). 30 tablet 0  . metoprolol (LOPRESSOR) 100 MG tablet TAKE 1 TABLET BY MOUTH 2 TIMES A DAY 60 tablet 3  . ondansetron (ZOFRAN) 8 MG tablet Take 1 tablet (8 mg total) by mouth 2 (two) times daily as needed (Nausea or vomiting). 30 tablet 1  . oxyCODONE (OXY IR/ROXICODONE) 5 MG immediate release tablet Take 1 tablet (5 mg total) by mouth every 4 (four) hours as needed for severe pain. 30 tablet 0  . prochlorperazine (COMPAZINE) 10 MG tablet Take 1 tablet (10 mg total) by mouth every 6 (six) hours as needed (Nausea or vomiting). 30 tablet 1  . vitamin E 100 UNIT capsule Take 100 Units by mouth every other day.      No current facility-administered medications for this visit.    REVIEW OF SYSTEMS:   Constitutional: Denies fevers, chills or abnormal night sweats Eyes: Denies blurriness of vision, double vision or watery eyes Ears, nose, mouth, throat, and face: Denies mucositis or sore throat Respiratory: Denies cough, dyspnea or wheezes Cardiovascular: Denies palpitation, chest discomfort or lower extremity swelling Gastrointestinal:  Denies nausea, heartburn or change in bowel habits Skin: Denies abnormal skin rashes Lymphatics: Denies new lymphadenopathy or easy bruising Neurological:Denies numbness, tingling or new weaknesses Behavioral/Psych: Mood is stable, no new changes  All other systems were reviewed with the patient and are negative.   PHYSICAL EXAMINATION: ECOG PERFORMANCE STATUS: 2 - Symptomatic, <50% confined to bed  Filed Vitals:   03/01/15 0955  BP: 176/63  Pulse: 70  Temp: 98  F (36.7 C)  Resp: 18   Filed Weights   03/01/15 0955  Weight: 186 lb 11.2 oz (84.687 kg)    GENERAL:alert, no distress , appears to be in good spirits and is smiling and quite verbal . SKIN: significant conjunctival pallor noted.  EYES: nl EOM, PERL OROPHARYNX:no exudate, no erythema and lips, buccal mucosa, and tongue normal  NECK: no JVD LYMPH:  no palpable lymphadenopathy in the cervical, axillary or inguinal LUNGS: clear to auscultation and percussion with normal breathing effort HEART: regular rate & rhythm and 2/6 SM aortic area ABDOMEN:abdomen soft, non-tender and normal bowel sounds Musculoskeletal: B/l 1+ pitting pedal edema in the left leg and 1+ pedal edema on the right leg. No Tenderness. No redness.  PSYCH: alert & oriented x 3 with fluent speech NEURO: no focal motor/sensory deficits  LABORATORY  DATA:   . CBC Latest Ref Rng 02/25/2015  WBC 4.0 - 10.3 10e3/uL 5.6  Hemoglobin 13.0 - 17.1 g/dL 10.7(L)  Hematocrit 38.4 - 49.9 % 31.3(L)  Platelets 140 - 400 10e3/uL 107(L)   . CMP Latest Ref Rng 02/25/2015  Glucose 70 - 140 mg/dl 103  BUN 7.0 - 26.0 mg/dL 38.9(H)  Creatinine 0.7 - 1.3 mg/dL 2.7(H)  Sodium 136 - 145 mEq/L 140  Potassium 3.5 - 5.1 mEq/L 3.5  Chloride 101 - 111 mmol/L -  CO2 22 - 29 mEq/L 22  Calcium 8.4 - 10.4 mg/dL 8.7  Total Protein 6.4 - 8.3 g/dL 6.1(L)  Total Bilirubin 0.20 - 1.20 mg/dL 0.41  Alkaline Phos 40 - 150 U/L 78  AST 5 - 34 U/L 19  ALT 0 - 55 U/L 16      RADIOGRAPHIC STUDIES: I have personally reviewed the radiological images as listed and agreed with the findings in the report.  .Dg Pelvis 1-2 Views  02/15/2015  CLINICAL DATA:  65 year old male with right hip pain after increased activity recently. Multiple myeloma diagnosed earlier this year. Subsequent encounter. EXAM: PELVIS - 1-2 VIEW COMPARISON:  PET-CT 10/14/2014, and earlier FINDINGS: Femoral heads are normally located and hip joint spaces are within normal limits for  age. Pelvis intact. Visible bone mineralization within normal limits. No lytic osseous lesion identified. Grossly intact proximal femurs. SI joints within normal limits. Negative visible bowel gas pattern. Stable left pelvic phlebolith. Calcified aortic and iliac atherosclerosis. IMPRESSION: No acute osseous abnormality identified about the pelvis. Electronically Signed   By: Genevie Ann M.D.   On: 02/15/2015 11:47   Jose Garrett Wo Contrast  02/25/2015  CLINICAL DATA:  New onset of right hip and buttock pain. EXAM: MRI LUMBAR Garrett WITHOUT CONTRAST TECHNIQUE: Multiplanar, multisequence Jose imaging of the lumbar Garrett was performed. No intravenous contrast was administered. COMPARISON:  CT abdomen and pelvis without contrast 09/08/2014. FINDINGS: Normal signal is present in the conus medullaris which terminates at T12-L1. Marrow signal and vertebral body heights are normal. Alignment is anatomic. There is some straightening of the normal lumbar lordosis, unchanged. Sequela of previous right perinephric hemorrhages again noted. Limited imaging of the abdomen is otherwise unremarkable. No significant adenopathy is present. T12-L1: Mild facet hypertrophy is present without significant stenosis. L1-2:  Negative. L2-3: Lateral disc bulging is present bilaterally without significant stenosis. L3-4: A broad-based disc protrusion is present. Mild facet hypertrophy contributes to mild bilateral subarticular narrowing and moderate foraminal stenosis, right greater than left. L4-5: A leftward disc protrusion is present. Moderate left and mild right subarticular narrowing is present. Moderate left and mild right foraminal stenosis is present. L5-S1: A broad-based disc protrusion is present. Mild subarticular narrowing is worse on the left. Mild to moderate foraminal stenosis is present bilaterally. IMPRESSION: 1. Multilevel spondylosis of the lumbar Garrett as described. 2. Mild bilateral subarticular and moderate bilateral  foraminal stenosis at L3-4, worse on the right. 3. Moderate left and mild right subarticular and foraminal stenosis at L4-5. 4. Mild subarticular and mild to moderate foraminal stenosis bilaterally at L5-S1. 5. Sequela of previous perinephric hemorrhage on the right. Electronically Signed   By: San Morelle M.D.   On: 02/25/2015 18:23   Dg Hip Unilat With Pelvis 2-3 Views Right  02/18/2015  CLINICAL DATA:  Right hip pain.  On steroids. EXAM: DG HIP (WITH OR WITHOUT PELVIS) 2-3V RIGHT COMPARISON:  None. FINDINGS: There is no evidence of hip fracture or dislocation. There is no  evidence of arthropathy or other focal bone abnormality. No evidence of AVN femoral head. IMPRESSION: Negative. Electronically Signed   By: Franchot Gallo M.D.   On: 02/18/2015 15:39     Echocardiogram 10/07/2014  Study Conclusions  - Left ventricle: The cavity size was normal. Wall thickness was increased in a pattern of moderate LVH. There was mild focal basal hypertrophy of the septum. Systolic function was normal. The estimated ejection fraction was in the range of 55% to 60%. Wall motion was normal; there were no regional wall motion abnormalities. Features are consistent with a pseudonormal left ventricular filling pattern, with concomitant abnormal relaxation and increased filling pressure (grade 2 diastolic dysfunction). - Aortic valve: There was very mild stenosis. There was mild regurgitation. Valve area (VTI): 2.67 cm^2. Valve area (Vmax): 2.49 cm^2. Valve area (Vmean): 2.3 cm^2. - Mitral valve: There was mild regurgitation.  Impressions:  - Normal LV function; grade 2 diastolic dysfunction; mild to moderate LVH with proximal septal thickening; calcified aortic valve with very mild AS (mean gradient 10 mmHg) and mild AI; mild Jose; trace TR. Global longitudinal strain - 17%.   ASSESSMENT & PLAN:    Jose Garrett is very pleasant 65 yo caucasian male with   1) AL Amyloidosis  noted on recent renal biopsy with nephrotic range proteinuria on urinalysis and some lambda free light chains on serum and urinary IFE . On diagnosis:  SPEP with small amount of M-spike 0.3  Echo showed no systolic dysfunction but grade 2 diastolic dysfunction. [facility] cardiac staging 1 as per troponin and BNP criteria. Bone marrow with no overt evidence of multiple myeloma. PET/CT scan showed no evidence of bony lesions or lymphadenopathy . Urine showed about 15 g of protein in 24 hours   After 1 month of treatment the M-spike is down to 0.1gm/dl and urinary total protein is down to about 7.6g/24hours  After 2 months of treatment: M-spike in 0.1g/dl lambda free light chains improved and urinary total protein down to about 4 g per 24 hours With no monoclonal free light chains in the urine.  After 3 months of treatment: M spike still 0.1g/dl, difference in serum free light chains less than 4, urine protein down to 7222 mg per 24 hours which is less than 50% of the initial suggesting good renal response. Overall patient appears to also have partial to very good partial response based on his systemic markers of AL amyloidosis.  After 4 months of treatment: M protein spike 0.1g/dl ..still with significant proteinuria 9.5g/dl  Plan -Labs stable and no prohibitive treatment toxicities -We'll continue current treatment if tolerated up to maximum response usually up to 6 months. -rpt SPEP/QIG/IFE and SFLC on 03/04/2015 -He has been seen at [facility] for consideration of an autologous Hematopoietic stem cell transplant and will be planning follow-up with them to make a final decision. -If the patient decides not to proceed with auto HSCT will switch to maintenance treatment after 6 months of treatment.  2) multifactorial anemia - likely multifactorial -from AL amyloidosis, anemia from CKD, chemotherapy. Stable with darbepoetin. 3) CKD -related to hypertension and AL Amyloidosis.  Creatinine is 2.7 today and is improved from his previous high of 3.5. Decreasing proteinuria 4) Feet tingling and numbness ? Related to AL Amyloidosis vs Velcade vs B12 deficiency stable. Started taking gabapentin 12/16/2014. Much improved symptoms. 5)Bilateral pedal edema improved since last visit. Ultrasound of bilateral lower extremity is negative for DVT. 6) acute bacterial sinusitis.-Resolved with antibiotics  Plan -  Recommended leg elevation and Jobst stockings -Continue current dose of Lasix with close monitoring of daily weights. -Continue darbepoetin 40 g every 28 days -next dose today -Continue subQ  B12 daily 28 days  -cyclophosphamide to 500 mg weekly and continue Velcade and dexamethasone ('20mg'$  qweekly). -Patient will get CBC, CMP weekly and myeloma labs every monthly.  -Referral to [facility] for autologous stem cell transplant evaluation if allowed by insurance. -increased neurontin to '300mg'$  po HS for neuropathy.  -Monitor blood pressure with darbepoetin. -Flonase given for allergic sinusitis. -continue on low-dose aspirin for VTE prophylaxis given his high risk of venous thromboembolism in the setting of nephrotic syndrome.  -Continue close f/u with PCP and nephrology.  Return to care with Dr. [name] 03/17/2014 with rpt labs.  All questions were answered. The patient knows to call the clinic with any problems, questions or concerns. I spent 15 minutes counseling the patient face to face. The total time spent in the appointment was 20 minutes and more than 50% was on counseling.   Sullivan Lone MD [place] Hematology/Oncology Physician [facility]  (Office):       [phone] (Work cell):  [phone] (Fax):           [phone]

## 2015-03-17 ENCOUNTER — Encounter: Payer: Self-pay | Admitting: *Deleted

## 2015-03-18 ENCOUNTER — Ambulatory Visit (HOSPITAL_BASED_OUTPATIENT_CLINIC_OR_DEPARTMENT_OTHER): Payer: Self-pay

## 2015-03-18 ENCOUNTER — Other Ambulatory Visit (HOSPITAL_BASED_OUTPATIENT_CLINIC_OR_DEPARTMENT_OTHER): Payer: Self-pay

## 2015-03-18 ENCOUNTER — Encounter: Payer: Self-pay | Admitting: Hematology

## 2015-03-18 ENCOUNTER — Other Ambulatory Visit: Payer: Self-pay | Admitting: *Deleted

## 2015-03-18 ENCOUNTER — Ambulatory Visit (HOSPITAL_BASED_OUTPATIENT_CLINIC_OR_DEPARTMENT_OTHER): Payer: Self-pay | Admitting: Hematology

## 2015-03-18 VITALS — BP 148/58 | HR 70 | Temp 98.2°F | Resp 16

## 2015-03-18 DIAGNOSIS — R200 Anesthesia of skin: Secondary | ICD-10-CM

## 2015-03-18 DIAGNOSIS — E8581 Light chain (AL) amyloidosis: Secondary | ICD-10-CM

## 2015-03-18 DIAGNOSIS — Z5111 Encounter for antineoplastic chemotherapy: Secondary | ICD-10-CM

## 2015-03-18 DIAGNOSIS — N183 Chronic kidney disease, stage 3 unspecified: Secondary | ICD-10-CM

## 2015-03-18 DIAGNOSIS — E858 Other amyloidosis: Secondary | ICD-10-CM

## 2015-03-18 DIAGNOSIS — R609 Edema, unspecified: Secondary | ICD-10-CM

## 2015-03-18 DIAGNOSIS — D649 Anemia, unspecified: Secondary | ICD-10-CM

## 2015-03-18 DIAGNOSIS — M5441 Lumbago with sciatica, right side: Secondary | ICD-10-CM

## 2015-03-18 DIAGNOSIS — I129 Hypertensive chronic kidney disease with stage 1 through stage 4 chronic kidney disease, or unspecified chronic kidney disease: Secondary | ICD-10-CM

## 2015-03-18 DIAGNOSIS — R530 Neoplastic (malignant) related fatigue: Secondary | ICD-10-CM

## 2015-03-18 DIAGNOSIS — M549 Dorsalgia, unspecified: Secondary | ICD-10-CM | POA: Insufficient documentation

## 2015-03-18 DIAGNOSIS — Z5112 Encounter for antineoplastic immunotherapy: Secondary | ICD-10-CM

## 2015-03-18 LAB — COMPREHENSIVE METABOLIC PANEL
ALK PHOS: 76 U/L (ref 40–150)
ALT: 9 U/L (ref 0–55)
ANION GAP: 12 meq/L — AB (ref 3–11)
AST: 13 U/L (ref 5–34)
Albumin: 3 g/dL — ABNORMAL LOW (ref 3.5–5.0)
BILIRUBIN TOTAL: 0.34 mg/dL (ref 0.20–1.20)
BUN: 44.7 mg/dL — ABNORMAL HIGH (ref 7.0–26.0)
CO2: 19 mEq/L — ABNORMAL LOW (ref 22–29)
Calcium: 9 mg/dL (ref 8.4–10.4)
Chloride: 108 mEq/L (ref 98–109)
Creatinine: 3 mg/dL (ref 0.7–1.3)
EGFR: 21 mL/min/{1.73_m2} — AB (ref >90)
Glucose: 103 mg/dl (ref 70–140)
Potassium: 4 mEq/L (ref 3.5–5.1)
Sodium: 139 mEq/L (ref 136–145)
TOTAL PROTEIN: 6.3 g/dL — AB (ref 6.4–8.3)

## 2015-03-18 LAB — CBC & DIFF AND RETIC
BASO%: 0.5 % (ref 0.0–2.0)
Basophils Absolute: 0 10*3/uL (ref 0.0–0.1)
EOS%: 6.8 % (ref 0.0–7.0)
Eosinophils Absolute: 0.4 10*3/uL (ref 0.0–0.5)
HCT: 30.4 % — ABNORMAL LOW (ref 38.4–49.9)
HGB: 10.2 g/dL — ABNORMAL LOW (ref 13.0–17.1)
IMMATURE RETIC FRACT: 7.7 % (ref 3.00–10.60)
LYMPH%: 12.3 % — AB (ref 14.0–49.0)
MCH: 30.1 pg (ref 27.2–33.4)
MCHC: 33.6 g/dL (ref 32.0–36.0)
MCV: 89.7 fL (ref 79.3–98.0)
MONO#: 0.3 10*3/uL (ref 0.1–0.9)
MONO%: 5.7 % (ref 0.0–14.0)
NEUT#: 4.3 10*3/uL (ref 1.5–6.5)
NEUT%: 74.7 % (ref 39.0–75.0)
PLATELETS: 138 10*3/uL — AB (ref 140–400)
RBC: 3.39 10*6/uL — AB (ref 4.20–5.82)
RDW: 14.8 % — ABNORMAL HIGH (ref 11.0–14.6)
Retic %: 1.98 % — ABNORMAL HIGH (ref 0.80–1.80)
Retic Ct Abs: 67.12 10*3/uL (ref 34.80–93.90)
WBC: 5.8 10*3/uL (ref 4.0–10.3)
lymph#: 0.7 10*3/uL — ABNORMAL LOW (ref 0.9–3.3)

## 2015-03-18 LAB — UIFE/LIGHT CHAINS/TP QN, 24-HR UR
ALBUMIN, U: DETECTED
ALPHA 1 UR: DETECTED — AB
ALPHA 2 UR: DETECTED — AB
Beta, Urine: DETECTED — AB
Gamma Globulin, Urine: DETECTED — AB
TIME-UPE24: 24 h
TOTAL PROTEIN, URINE-UPE24: 351 mg/dL — AB (ref 5–25)
Total Protein, Urine-Ur/day: 7371 mg/d — ABNORMAL HIGH (ref <150)
Volume, Urine: 2100 mL

## 2015-03-18 LAB — 24 HR URINE,KAPPA/LAMBDA LIGHT CHAINS
MEASURED LAMBDA CHAIN: 2.42 mg/dL — AB (ref <2.00)
Measured Kappa Chain: 2.66 mg/dL — ABNORMAL HIGH (ref <2.00)
TOTAL KAPPA CHAIN: 55.86 mg/(24.h)
Total Lambda Chain: 50.82 mg/24 h
URINE VOLUME: 2100 mL/(24.h)

## 2015-03-18 LAB — TECHNOLOGIST REVIEW

## 2015-03-18 MED ORDER — OXYCODONE HCL 5 MG PO TABS: 5.0000 mg | ORAL_TABLET | ORAL | Status: DC | PRN

## 2015-03-18 MED ORDER — SODIUM CHLORIDE 0.9 % IV SOLN
500.0000 mg | Freq: Once | INTRAVENOUS | Status: AC
  Administered 2015-03-18: 500 mg via INTRAVENOUS
  Filled 2015-03-18: qty 25

## 2015-03-18 MED ORDER — SODIUM CHLORIDE 0.9 % IV SOLN
Freq: Once | INTRAVENOUS | Status: AC
  Administered 2015-03-18: 11:00:00 via INTRAVENOUS
  Filled 2015-03-18: qty 4

## 2015-03-18 MED ORDER — BORTEZOMIB CHEMO SQ INJECTION 3.5 MG (2.5MG/ML)
1.3000 mg/m2 | Freq: Once | INTRAMUSCULAR | Status: AC
  Administered 2015-03-18: 2.5 mg via SUBCUTANEOUS
  Filled 2015-03-18: qty 2.5

## 2015-03-18 MED ORDER — SODIUM CHLORIDE 0.9 % IV SOLN
Freq: Once | INTRAVENOUS | Status: AC
  Administered 2015-03-18: 10:00:00 via INTRAVENOUS

## 2015-03-18 NOTE — Progress Notes (Signed)
Clarified orders with Dr. [name], pt received Vitamin B-12 on 03/11/15 and Aranesp on 03/04/15, pt does not need Vitamin B-12 or Aranesp at this time, they are both due monthly per Dr. [name]. Creatinine 3.0, per treatment conditions okay to treat with creatinine less than 4.

## 2015-03-18 NOTE — Patient Instructions (Signed)
[facility] Discharge Instructions for Patients Receiving Chemotherapy  Today you received the following chemotherapy agents: Cytoxan and Velcade  To help prevent nausea and vomiting after your treatment, we encourage you to take your nausea medication as directed.    If you develop nausea and vomiting that is not controlled by your nausea medication, call the clinic.   BELOW ARE SYMPTOMS THAT SHOULD BE REPORTED IMMEDIATELY:  *FEVER GREATER THAN 100.5 F  *CHILLS WITH OR WITHOUT FEVER  NAUSEA AND VOMITING THAT IS NOT CONTROLLED WITH YOUR NAUSEA MEDICATION  *UNUSUAL SHORTNESS OF BREATH  *UNUSUAL BRUISING OR BLEEDING  TENDERNESS IN MOUTH AND THROAT WITH OR WITHOUT PRESENCE OF ULCERS  *URINARY PROBLEMS  *BOWEL PROBLEMS  UNUSUAL RASH Items with * indicate a potential emergency and should be followed up as soon as possible.  Feel free to call the clinic you have any questions or concerns. The clinic phone number is [phone].  Please show the CHEMO ALERT CARD at check-in to the Emergency Department and triage nurse.   

## 2015-03-22 ENCOUNTER — Ambulatory Visit: Payer: No Typology Code available for payment source | Admitting: Physical Therapy

## 2015-03-23 ENCOUNTER — Telehealth: Payer: Self-pay | Admitting: Physical Therapy

## 2015-03-23 ENCOUNTER — Ambulatory Visit: Payer: Self-pay | Attending: Hematology | Admitting: Physical Therapy

## 2015-03-23 ENCOUNTER — Ambulatory Visit: Payer: No Typology Code available for payment source | Admitting: Physical Therapy

## 2015-03-23 DIAGNOSIS — M5441 Lumbago with sciatica, right side: Secondary | ICD-10-CM | POA: Insufficient documentation

## 2015-03-23 DIAGNOSIS — Z7409 Other reduced mobility: Secondary | ICD-10-CM | POA: Insufficient documentation

## 2015-03-23 DIAGNOSIS — M545 Low back pain: Secondary | ICD-10-CM | POA: Insufficient documentation

## 2015-03-23 NOTE — Therapy (Signed)
[place], [place], [address] Phone: [phone]   Fax:  [phone]  Physical Therapy Evaluation  Patient Details  Name: Jose Garrett MRN: [id number] Date of Birth: 1951-02-20 Referring Provider: Dr. Lia Hopping  Encounter Date: 03/23/2015      PT End of Session - 03/23/15 1707    Visit Number 1   Number of Visits 5  to 9 as needed   Date for PT Re-Evaluation 04/22/15   PT Start Time 1605   PT Stop Time 1715   PT Time Calculation (min) 70 min   Activity Tolerance Patient tolerated treatment well   Behavior During Therapy [facility] for tasks assessed/performed      Past Medical History  Diagnosis Date  . Hypertension Dx 2012  . Hyperlipidemia Dx 2012  . History of blood transfusion 09/08/2014    "got hematoma after renal biopsy & HgB dropped"  . Anemia Dx 2016  . Family history of adverse reaction to anesthesia     "my daughter can't take certain anesthesia agents" (09/08/2014)  . Enlarged heart 2015  . Chronic diastolic CHF (congestive heart failure), NYHA class 1 ([place])     /hospital problem list 09/08/2014  . Renal disorder     "there is a spot on my kidney" (09/08/2014)  . Chronic renal disease, stage III     /hospital problem list 09/08/2014    Past Surgical History  Procedure Laterality Date  . Renal biopsy, percutaneous Right 09/08/2014  . Tonsillectomy  ~ 1960  . Laparoscopic cholecystectomy  03/2011    There were no vitals filed for this visit.  Visit Diagnosis:  Right-sided low back pain with right-sided sciatica - Plan: PT plan of care cert/re-cert  Mobility impaired - Plan: PT plan of care cert/re-cert      Subjective Assessment - 03/23/15 1614    Subjective "Apparently I have a pinched nerve" in his low back.  First occurrence two months ago, attributed to some lifting he did for a craft show.  It got worse; he ended up in ED one morning.   Patient is accompained by: Family  member  daughter in lobby   Pertinent History Back pain with first occurrence two months ago, attributed to some lifting he did for a craft show.  It got worse; he ended up in ED one morning.  Percocet helped, roxycodone helps.  Had MRI around Christmas that showed DDD with pinched nerve.  Diagnosed July 2016 with amyloidosis and has been on chemo since August; will go into a maintenance mode this coming week (every two weeks).  HTN controlled with meds.  Reports some neuropathy in both feet.  Stage III kidney disease.   Patient Stated Goals decrease back pain   Currently in Pain? Yes   Pain Score 7   up to 10 at worst   Pain Location Buttocks  and iliac crest, hamstrings just proximal to knee and at lateral lower calf, all on right   Pain Orientation Right   Pain Descriptors / Indicators Other (Comment)  "horrible"; hamstring pulls; lower leg "fires" and feel weak   Pain Type Acute pain   Aggravating Factors  helping set up a computer table   Pain Relieving Factors lying on left side with head elevated; roxycodone            [facility] PT Assessment - 03/23/15 0001    Assessment   Medical Diagnosis low back pain with DDD and radiculopathy; amyloidosis  Referring Provider Dr. Lia Hopping   Onset Date/Surgical Date --  2 months ago   Precautions   Precautions Fall   Restrictions   Weight Bearing Restrictions No   Balance Screen   Has the patient fallen in the past 6 months Yes  into a wall, not on the floor   How many times? 3  perhaps from neuropathy   Has the patient had a decrease in activity level because of a fear of falling?  Yes   Is the patient reluctant to leave their home because of a fear of falling?  No   Home Environment   Living Environment Private residence   Living Arrangements Children   Type of [place] Access Level entry   Home Layout One level   Prior Function   Level of Independence Independent   Vocation Retired   Leisure not currently, but  loves walking, shopping, bookstores   Cognition   Overall Cognitive Status Within Functional Limits for tasks assessed   Observation/Other Assessments   Observations mature man who appears uncomfortable in sitting; walks with heavy lean on cane out in front of him   ROM / Strength   AROM / PROM / Strength AROM   AROM   AROM Assessment Site Lumbar   Lumbar Flexion in supine, can pull right leg into 90 degrees hip flexion only (feels too tight to move farther); on left, can pull into approx. 105 degrees hip flexion   Lumbar Extension in standing, unable to extend past neutral; in prone, able to go just past neutral   Palpation   Palpation comment low back muscles are tight and may be in spasm, at least intermittently   Special Tests    Special Tests Lumbar   Lumbar Tests other   Ambulation/Gait   Ambulation/Gait Yes   Ambulation/Gait Assistance 6: Modified independent (Device/Increase time)  can hobble without, but leans heavily on cane                   [facility] Adult PT Treatment/Exercise - 03/23/15 0001    Lumbar Exercises: Prone   Other Prone Lumbar Exercises Lying prone with pillow under chest x 2 minutes; then prone on elbows for 2 minutes (for low back/leg pain remediation).   Modalities   Modalities Electrical Stimulation;Moist Heat   Moist Heat Therapy   Number Minutes Moist Heat 15 Minutes   Moist Heat Location Lumbar Spine  in prone, with electrical stim   Electrical Stimulation   Electrical Stimulation Location right low back-upper gluteals  in prone   Electrical Stimulation Action interferential   Electrical Stimulation Parameters 80-150 Hz   Electrical Stimulation Goals Pain                PT Education - 03/23/15 1706    Education provided Yes   Education Details prone lying with pillow under chest, then prone on elbows x 1-2 minutes each for pain relief   Person(s) Educated Patient   Methods Explanation;Demonstration;Handout   Comprehension  Verbalized understanding;Returned demonstration                [facility] Goals - 03/23/15 2304    CC Long Term Goal  #1   Title Patient will report at least 50% decrease in right low back and right leg pain.   Baseline 7/10 or more   Time 4   Period Weeks   Status New   CC Long Term Goal  #2  Title Patient will be independent with home exercise program and other self-pain management techniques   Time 4   Period Weeks   Status New   CC Long Term Goal  #3   Title Patient will be able to walk without a limp.   Time 4   Period Weeks   Status New            Plan - 03/23/15 2251    Clinical Impression Statement This is a pleasant gentleman with significant back pain with radiculopathy currently; he also has amyloidosis, for which he is receiving chemotherapy, neuropathy in his feet,  and stage III kidney disease.  It was difficult to determine exacerbating and ameliorating activities for his back pain today simply because his pain level was so significant that many things hurt.  He did seem to have a bit of relief, though not dramatic, with lumbar extension achieved through prone lying with pillow under his chest and then prone on elbows position.  These seemed to decrease symptoms in his right leg.  He was also treated with moist heat and electrical stimulation to his right low back in an effort to relax muscles there and provide some pain relief.    Pt will benefit from skilled therapeutic intervention in order to improve on the following deficits Pain;Decreased mobility;Difficulty walking;Increased muscle spasms   Rehab Potential Good   PT Frequency 2x / week   PT Duration 2 weeks  to 4 weeks as needed and as patient is able   PT Treatment/Interventions Therapeutic exercise;Electrical Stimulation;Moist Heat;Patient/family education;Ultrasound;Cryotherapy;Manual techniques;Traction   PT Next Visit Plan Assess benefit of today's treatment and of prone lumbar extension,  if patient did some of this at home.  If beneficial, continue McKenzie extension approach, progressing to prone press-ups and/or standing back extension if they do not aggravate and if they provide symptomatic relief.  Continue heat and electrical stimulation if beneficial.  Alternatively, consider trial of piriformis stretch and ultrasound to right piriformis as well as soft tissue work to low back and gluteals; could also consider a trial of manual or mechanical pelvic traction to assess pain relief.   PT Home Exercise Plan trying gentle lumbar extension first   Consulted and Agree with Plan of Care Patient         Problem List Patient Active Problem List   Diagnosis Date Noted  . Back pain 03/18/2015  . Hip pain 02/18/2015  . AL amyloidosis ([place]) 09/25/2014  . Anemia of chronic disease 09/25/2014  . Abnormal bruising 09/25/2014  . Renal hematoma, right 09/08/2014  . Chronic diastolic heart failure, NYHA class 1 ([place]) 09/08/2014  . Acute blood loss anemia 09/08/2014  . Chronic renal disease, stage III   . Hematuria   . Proteinuria   . Vitamin D deficiency 01/21/2014  . Hypertriglyceridemia 01/21/2014  . Essential hypertension, benign 11/06/2013  . Dependent edema 11/06/2013  . DOE (dyspnea on exertion) 11/06/2013  . Other malaise and fatigue 11/06/2013    Melvenia Favela 03/23/2015, 11:11 PM  [place], [place], [address] Phone: [phone]   Fax:  [phone]  Name: Jose Garrett MRN: [id number] Date of Birth: [birth year]   Serafina Royals, PT 03/23/2015 11:11 PM

## 2015-03-23 NOTE — Telephone Encounter (Signed)
Pt has no insurance, gave him Editor, commissioning today.

## 2015-03-23 NOTE — Patient Instructions (Signed)
Every two hours or so, lie on your stomach with a pillow under your chest.  Do this for 1-2 minutes. Then prop up on your elbows, keeping your back relaxed.  Do this on a firm bed with your toes hanging over the edge of the bed.  Hold for 1-2 minutes.  When done with this, get up very carefully, trying to keep your back from bending or twisting.    The goal of this activity is to do one or more of the following: (1) bring the pain "out" of your leg or decrease the pain in your leg, even if there is pain in the middle of your low back (2) decrease the pain in the right low back/buttocks area (3) eliminate the pain altogether.  Do NOT continue if pain in the leg is made worse, or if there is the onset of numbness, tingling, or weakness that you didn't have before. You may do this more often if it does help the pain; do it when the pain bothers you.

## 2015-03-24 ENCOUNTER — Ambulatory Visit: Payer: Self-pay

## 2015-03-24 DIAGNOSIS — M5441 Lumbago with sciatica, right side: Secondary | ICD-10-CM

## 2015-03-24 DIAGNOSIS — Z7409 Other reduced mobility: Secondary | ICD-10-CM

## 2015-03-24 NOTE — Therapy (Signed)
[place], [place], [address] Phone: [phone]   Fax:  [phone]  Physical Therapy Treatment  Patient Details  Name: Jose Garrett MRN: [id number] Date of Birth: 19-Mar-1950 Referring Provider: Dr. Lia Hopping  Encounter Date: 03/24/2015      PT End of Session - 03/24/15 1145    Visit Number 2   Number of Visits 5  to 9 prn   Date for PT Re-Evaluation 04/22/15   PT Start Time 0806   PT Stop Time 0907   PT Time Calculation (min) 61 min   Activity Tolerance Patient limited by pain;Patient tolerated treatment well   Behavior During Therapy [facility] for tasks assessed/performed      Past Medical History  Diagnosis Date  . Hypertension Dx 2012  . Hyperlipidemia Dx 2012  . History of blood transfusion 09/08/2014    "got hematoma after renal biopsy & HgB dropped"  . Anemia Dx 2016  . Family history of adverse reaction to anesthesia     "my daughter can't take certain anesthesia agents" (09/08/2014)  . Enlarged heart 2015  . Chronic diastolic CHF (congestive heart failure), NYHA class 1 ([place])     /hospital problem list 09/08/2014  . Renal disorder     "there is a spot on my kidney" (09/08/2014)  . Chronic renal disease, stage III     /hospital problem list 09/08/2014    Past Surgical History  Procedure Laterality Date  . Renal biopsy, percutaneous Right 09/08/2014  . Tonsillectomy  ~ 1960  . Laparoscopic cholecystectomy  03/2011    There were no vitals filed for this visit.  Visit Diagnosis:  Right-sided low back pain with right-sided sciatica  Mobility impaired      Subjective Assessment - 03/24/15 0810    Subjective I noticed a little relief last night and then this morning did have some new muscle spasms I imagine just from doing something new. I definitely think it helped a little. Pain isn't running down my leg this morning.    Currently in Pain? Yes   Pain Score 8    Pain Location Back    Pain Orientation Right   Pain Descriptors / Indicators Aching;Spasm;Pounding   Pain Type Acute pain   Aggravating Factors  everything this morning   Pain Relieving Factors What we did last night seemed to help a little, laying on my Lt side helps a little                         [facility] Adult PT Treatment/Exercise - 03/24/15 0001    Lumbar Exercises: Sidelying   Other Sidelying Lumbar Exercises PROM to Rt hip into flexion, extension, and limited diagonol patterns to pts tolerance which was limited partially due to pain and partially due to muscle tightness.   Moist Heat Therapy   Number Minutes Moist Heat 15 Minutes   Moist Heat Location Lumbar Spine  Started prone but had to finish in Lt S/L due to back cramps   Electrical Stimulation   Electrical Stimulation Location Rt low back and upper gluteals  Started in prone, but then to Lt S/L due to muscle cramping    Electrical Stimulation Action IFC   Electrical Stimulation Parameters 80-150Hz  x15 minutes   Electrical Stimulation Goals Pain   Ultrasound   Ultrasound Location Rt low back/glut  In Lt S/L   Ultrasound Parameters 1 MHz, 1.0 w/cm2, 100% x10 minutes (8 mins to low  back, 2 mins to pirformis area   Ultrasound Goals Pain                PT Education - 03/23/15 1706    Education provided Yes   Education Details prone lying with pillow under chest, then prone on elbows x 1-2 minutes each for pain relief   Person(s) Educated Patient   Methods Explanation;Demonstration;Handout   Comprehension Verbalized understanding;Returned demonstration                [facility] Goals - 03/23/15 2304    CC Long Term Goal  #1   Title Patient will report at least 50% decrease in right low back and right leg pain.   Baseline 7/10 or more   Time 4   Period Weeks   Status New   CC Long Term Goal  #2   Title Patient will be independent with home exercise program and other self-pain management techniques    Time 4   Period Weeks   Status New   CC Long Term Goal  #3   Title Patient will be able to walk without a limp.   Time 4   Period Weeks   Status New            Plan - 03/24/15 1146    Clinical Impression Statement Pt tolerated modalities well but had very low tolerance to hip PROM and positional lying due to low back muscle cramping and had to switch positions a few times between prone, half prone and Lt S/L. He had minimal relief after treatment yesterday afternoon and some muscle cramping this morning that he reported was from moving/exercises at therapy.    Pt will benefit from skilled therapeutic intervention in order to improve on the following deficits Pain;Decreased mobility;Difficulty walking;Increased muscle spasms   Rehab Potential Good   PT Frequency 2x / week   PT Duration 2 weeks  to 4 weeks prn and as pt is able    PT Next Visit Plan Assess benefit of today's treatment which also included [place]; cont as able prone lumbar extension.  If beneficial, continue McKenzie extension approach, progressing to prone press-ups and/or standing back extension if they do not aggravate and if they provide symptomatic relief.  Continue heat and electrical stimulation if beneficial. Could also consider a trial of manual or mechanical pelvic traction to assess pain relief.   PT Home Exercise Plan trying gentle lumbar extension first over weekend and with intermittent use of heat   Consulted and Agree with Plan of Care Patient        Problem List Patient Active Problem List   Diagnosis Date Noted  . Back pain 03/18/2015  . Hip pain 02/18/2015  . AL amyloidosis ([place]) 09/25/2014  . Anemia of chronic disease 09/25/2014  . Abnormal bruising 09/25/2014  . Renal hematoma, right 09/08/2014  . Chronic diastolic heart failure, NYHA class 1 ([place]) 09/08/2014  . Acute blood loss anemia 09/08/2014  . Chronic renal disease, stage III   . Hematuria   . Proteinuria   . Vitamin D deficiency  01/21/2014  . Hypertriglyceridemia 01/21/2014  . Essential hypertension, benign 11/06/2013  . Dependent edema 11/06/2013  . DOE (dyspnea on exertion) 11/06/2013  . Other malaise and fatigue 11/06/2013    Otelia Limes, PTA 03/24/2015, 11:51 AM  [place], [place], [address] Phone: [phone]   Fax:  [phone]  Name: Jose Garrett MRN: [id number] Date  of Birth: [birth year]

## 2015-03-25 ENCOUNTER — Ambulatory Visit (HOSPITAL_BASED_OUTPATIENT_CLINIC_OR_DEPARTMENT_OTHER): Payer: Self-pay

## 2015-03-25 DIAGNOSIS — E8581 Light chain (AL) amyloidosis: Secondary | ICD-10-CM

## 2015-03-25 DIAGNOSIS — Z5112 Encounter for antineoplastic immunotherapy: Secondary | ICD-10-CM

## 2015-03-25 DIAGNOSIS — Z5111 Encounter for antineoplastic chemotherapy: Secondary | ICD-10-CM

## 2015-03-25 DIAGNOSIS — E858 Other amyloidosis: Secondary | ICD-10-CM

## 2015-03-25 LAB — CBC & DIFF AND RETIC
BASO%: 0.4 % (ref 0.0–2.0)
BASOS ABS: 0 10*3/uL (ref 0.0–0.1)
EOS%: 7.4 % — ABNORMAL HIGH (ref 0.0–7.0)
Eosinophils Absolute: 0.4 10*3/uL (ref 0.0–0.5)
HEMATOCRIT: 28.7 % — AB (ref 38.4–49.9)
HGB: 9.9 g/dL — ABNORMAL LOW (ref 13.0–17.1)
IMMATURE RETIC FRACT: 1.6 % — AB (ref 3.00–10.60)
LYMPH#: 0.6 10*3/uL — AB (ref 0.9–3.3)
LYMPH%: 11 % — ABNORMAL LOW (ref 14.0–49.0)
MCH: 30.5 pg (ref 27.2–33.4)
MCHC: 34.5 g/dL (ref 32.0–36.0)
MCV: 88.3 fL (ref 79.3–98.0)
MONO#: 0.3 10*3/uL (ref 0.1–0.9)
MONO%: 6 % (ref 0.0–14.0)
NEUT#: 3.8 10*3/uL (ref 1.5–6.5)
NEUT%: 75.2 % — AB (ref 39.0–75.0)
PLATELETS: 131 10*3/uL — AB (ref 140–400)
RBC: 3.25 10*6/uL — AB (ref 4.20–5.82)
RDW: 14.9 % — ABNORMAL HIGH (ref 11.0–14.6)
RETIC CT ABS: 48.1 10*3/uL (ref 34.80–93.90)
Retic %: 1.48 % (ref 0.80–1.80)
WBC: 5 10*3/uL (ref 4.0–10.3)

## 2015-03-25 LAB — COMPREHENSIVE METABOLIC PANEL
ALT: 13 U/L (ref 0–55)
AST: 16 U/L (ref 5–34)
Albumin: 3 g/dL — ABNORMAL LOW (ref 3.5–5.0)
Alkaline Phosphatase: 74 U/L (ref 40–150)
Anion Gap: 11 mEq/L (ref 3–11)
BUN: 40.6 mg/dL — ABNORMAL HIGH (ref 7.0–26.0)
CO2: 20 mEq/L — ABNORMAL LOW (ref 22–29)
Calcium: 9 mg/dL (ref 8.4–10.4)
Chloride: 107 mEq/L (ref 98–109)
Creatinine: 2.9 mg/dL — ABNORMAL HIGH (ref 0.7–1.3)
EGFR: 22 mL/min/{1.73_m2} — ABNORMAL LOW (ref >90)
Glucose: 105 mg/dl (ref 70–140)
Potassium: 3.7 mEq/L (ref 3.5–5.1)
Sodium: 138 mEq/L (ref 136–145)
Total Bilirubin: 0.34 mg/dL (ref 0.20–1.20)
Total Protein: 6.5 g/dL (ref 6.4–8.3)

## 2015-03-25 MED ORDER — SODIUM CHLORIDE 0.9 % IV SOLN
Freq: Once | INTRAVENOUS | Status: AC
  Administered 2015-03-25: 14:00:00 via INTRAVENOUS
  Filled 2015-03-25: qty 4

## 2015-03-25 MED ORDER — BORTEZOMIB CHEMO SQ INJECTION 3.5 MG (2.5MG/ML)
1.3000 mg/m2 | Freq: Once | INTRAMUSCULAR | Status: AC
  Administered 2015-03-25: 2.5 mg via SUBCUTANEOUS
  Filled 2015-03-25: qty 2.5

## 2015-03-25 MED ORDER — SODIUM CHLORIDE 0.9 % IV SOLN
500.0000 mg | Freq: Once | INTRAVENOUS | Status: AC
  Administered 2015-03-25: 500 mg via INTRAVENOUS
  Filled 2015-03-25: qty 25

## 2015-03-25 MED ORDER — SODIUM CHLORIDE 0.9 % IV SOLN
Freq: Once | INTRAVENOUS | Status: AC
  Administered 2015-03-25: 13:00:00 via INTRAVENOUS

## 2015-03-25 NOTE — Patient Instructions (Signed)
[facility] Discharge Instructions for Patients Receiving Chemotherapy  Today you received the following chemotherapy agents: Cytoxan and Velcade  To help prevent nausea and vomiting after your treatment, we encourage you to take your nausea medication as directed.    If you develop nausea and vomiting that is not controlled by your nausea medication, call the clinic.   BELOW ARE SYMPTOMS THAT SHOULD BE REPORTED IMMEDIATELY:  *FEVER GREATER THAN 100.5 F  *CHILLS WITH OR WITHOUT FEVER  NAUSEA AND VOMITING THAT IS NOT CONTROLLED WITH YOUR NAUSEA MEDICATION  *UNUSUAL SHORTNESS OF BREATH  *UNUSUAL BRUISING OR BLEEDING  TENDERNESS IN MOUTH AND THROAT WITH OR WITHOUT PRESENCE OF ULCERS  *URINARY PROBLEMS  *BOWEL PROBLEMS  UNUSUAL RASH Items with * indicate a potential emergency and should be followed up as soon as possible.  Feel free to call the clinic you have any questions or concerns. The clinic phone number is [phone].  Please show the CHEMO ALERT CARD at check-in to the Emergency Department and triage nurse.   

## 2015-03-25 NOTE — Progress Notes (Signed)
Clarified orders with Dr. [name], pt received Vitamin B-12 on 03/11/15 and Aranesp on 03/04/15, pt does not need Vitamin B-12 or Aranesp at this time, they are both due monthly per Dr. [name]. Labs ordered by Dr. [name] and collected and sent to lab. Per Dr. [name] may proceed with treatment without lab results may treat based on 03/18/15 labs. Creatinine 3.0, per treatment conditions okay to treat with creatinine less than 4.

## 2015-03-27 ENCOUNTER — Telehealth: Payer: Self-pay | Admitting: Hematology

## 2015-03-27 NOTE — Telephone Encounter (Signed)
Aware of velcade appointments

## 2015-03-28 ENCOUNTER — Ambulatory Visit: Payer: No Typology Code available for payment source

## 2015-03-29 ENCOUNTER — Ambulatory Visit: Payer: Self-pay | Admitting: Physical Therapy

## 2015-03-29 DIAGNOSIS — M545 Low back pain, unspecified: Secondary | ICD-10-CM

## 2015-03-29 LAB — MULTIPLE MYELOMA PANEL, SERUM
ALBUMIN SERPL ELPH-MCNC: 2.9 g/dL (ref 2.9–4.4)
Albumin/Glob SerPl: 1.1 (ref 0.7–1.7)
Alpha 1: 0.4 g/dL (ref 0.0–0.4)
Alpha2 Glob SerPl Elph-Mcnc: 1.3 g/dL — ABNORMAL HIGH (ref 0.4–1.0)
B-Globulin SerPl Elph-Mcnc: 0.9 g/dL (ref 0.7–1.3)
GAMMA GLOB SERPL ELPH-MCNC: 0.3 g/dL — AB (ref 0.4–1.8)
GLOBULIN, TOTAL: 2.9 g/dL (ref 2.2–3.9)
IGA/IMMUNOGLOBULIN A, SERUM: 71 mg/dL (ref 61–437)
IgG, Qn, Serum: 310 mg/dL — ABNORMAL LOW (ref 700–1600)
IgM, Qn, Serum: 64 mg/dL (ref 20–172)
M Protein SerPl Elph-Mcnc: 0.1 g/dL — ABNORMAL HIGH
TOTAL PROTEIN: 5.8 g/dL — AB (ref 6.0–8.5)

## 2015-03-29 NOTE — Therapy (Addendum)
[place], [place], [address] Phone: [phone]   Fax:  [phone]  Physical Therapy Treatment  Patient Details  Name: Jose Garrett MRN: [id number] Date of Birth: November 08, 1950 Referring Provider: Dr. Lia Hopping  Encounter Date: 03/29/2015      PT End of Session - 03/29/15 1701    Visit Number 3   Number of Visits 5  to 9 prn   Date for PT Re-Evaluation 04/22/15   PT Start Time 1610   PT Stop Time 1712   PT Time Calculation (min) 62 min   Activity Tolerance Patient tolerated treatment well   Behavior During Therapy [facility] for tasks assessed/performed      Past Medical History  Diagnosis Date  . Hypertension Dx 2012  . Hyperlipidemia Dx 2012  . History of blood transfusion 09/08/2014    "got hematoma after renal biopsy & HgB dropped"  . Anemia Dx 2016  . Family history of adverse reaction to anesthesia     "my daughter can't take certain anesthesia agents" (09/08/2014)  . Enlarged heart 2015  . Chronic diastolic CHF (congestive heart failure), NYHA class 1 ([place])     /hospital problem list 09/08/2014  . Renal disorder     "there is a spot on my kidney" (09/08/2014)  . Chronic renal disease, stage III     /hospital problem list 09/08/2014    Past Surgical History  Procedure Laterality Date  . Renal biopsy, percutaneous Right 09/08/2014  . Tonsillectomy  ~ 1960  . Laparoscopic cholecystectomy  03/2011    There were no vitals filed for this visit.  Visit Diagnosis:  Bilateral low back pain without sciatica      Subjective Assessment - 03/29/15 1612    Subjective Was in a lot of pain after last visit; it got better Sunday.  Got his sister's walker and used that; feels that helped by keeping him centered.  Pain in ankle and hamstring alleviated over the weekend.  Feel like he can move around better.  Has just done the prone lying and prone on elbows a little bit, mainly on Sunday.   Currently  in Pain? Yes   Pain Score 4    Pain Location Back   Pain Orientation Lower;Right;Left   Pain Descriptors / Indicators Pressure   Aggravating Factors  lifting?   Pain Relieving Factors sitting up straight                         [facility] Adult PT Treatment/Exercise - 03/29/15 0001    Lumbar Exercises: Supine   Other Supine Lumbar Exercises single knee to chest stretches 5 counts x 5 each leg;    Other Supine Lumbar Exercises figure 4 piriformis stretch left 5 counts x 4, then right 5 counts x 3   Lumbar Exercises: Prone   Other Prone Lumbar Exercises press-ups x 10 (partial ROM) x 2 sets; no real change in location or intensity of pain   Knee/Hip Exercises: Seated   Other Seated Knee/Hip Exercises seated figure 4 piriformis stretch left and right   Moist Heat Therapy   Number Minutes Moist Heat 15 Minutes   Moist Heat Location Lumbar Spine  in prone   Manual Therapy   Manual Therapy Other (comment)   Other Manual Therapy prone over pillow, soft tissue work to low back for muscle relaxation                PT  Education - 03/29/15 1717    Education provided Yes   Education Details continue prone lying and prone on elbows; add single knee to chest stretches 2-3x/day   Person(s) Educated Patient   Methods Explanation;Demonstration;Handout   Comprehension Verbalized understanding;Returned demonstration                [facility] Goals - 03/29/15 1706    CC Long Term Goal  #1   Title Patient will report at least 50% decrease in right low back and right leg pain.   Baseline leg pain is gone as of 03/29/15; back pain is reduced to 4-5/10   Status Partially Met   CC Long Term Goal  #2   Title Patient will be independent with home exercise program and other self-pain management techniques   Status On-going   CC Long Term Goal  #3   Title Patient will be able to walk without a limp.   Status On-going            Plan - 03/29/15 1701     Clinical Impression Statement Patient came in today reporting some improvement, and this was evident by his standing up straighter, walking more normally (still with a cane, but not leaning heavily on the cane out in front of him), and by his being able to bring each knee towards his chest farther in supine than on initial evaluation.  Prone press-ups were tried but did not seem to change his pain; single knee to chest was tolerated better; piriformis stretches were also tried and tolerated okay.  Soft tissue work to low back and heat to same improved his comfort.  Pain was reduced to 3/10 at end of session, and no pain in leg.   Pt will benefit from skilled therapeutic intervention in order to improve on the following deficits Pain;Decreased mobility;Difficulty walking;Increased muscle spasms   Rehab Potential Good   PT Frequency 2x / week   PT Duration 2 weeks  to 4 weeks   PT Treatment/Interventions Therapeutic exercise;Manual techniques;Moist Heat   PT Next Visit Plan Patient plans to see how he does and call us on 04/01/15 about whether he will return.  If he does return, assess status and benefit of today's treatment as well as home exercise program.  Try progressing low back stretches to include lower trunk rotation; also repeat piriformis stretches and consider adding to HEP (in sitting).   Consulted and Agree with Plan of Care Patient        Problem List Patient Active Problem List   Diagnosis Date Noted  . Back pain 03/18/2015  . Hip pain 02/18/2015  . AL amyloidosis ([place]) 09/25/2014  . Anemia of chronic disease 09/25/2014  . Abnormal bruising 09/25/2014  . Renal hematoma, right 09/08/2014  . Chronic diastolic heart failure, NYHA class 1 ([place]) 09/08/2014  . Acute blood loss anemia 09/08/2014  . Chronic renal disease, stage III   . Hematuria   . Proteinuria   . Vitamin D deficiency 01/21/2014  . Hypertriglyceridemia 01/21/2014  . Essential hypertension, benign 11/06/2013  .  Dependent edema 11/06/2013  . DOE (dyspnea on exertion) 11/06/2013  . Other malaise and fatigue 11/06/2013    SALISBURY,DONNA 03/29/2015, 5:19 PM  [place], [place], [address] Phone: [phone]   Fax:  [phone]  Name: Jose Garrett MRN: [id number] Date of Birth: 1950/12/22    Serafina Royals, PT 03/29/2015 5:19 PM  PHYSICAL THERAPY DISCHARGE SUMMARY  Visits  from Start of Care: 3  Current functional level related to goals / functional outcomes: Goals partially met as noted above.   Remaining deficits: Unknown:  Patient did not call back as he said he would and did not return for further follow-up.  He was going to see how he did to decide whether he needed further therapy.   Education / Equipment: Pain management for low back pain through exercise.  Plan: Patient agrees to discharge.  Patient goals were partially met. Patient is being discharged due to the patient's request.  ?????    Serafina Royals, PT 05/11/2015 8:43 PM

## 2015-03-29 NOTE — Patient Instructions (Signed)
Knee to Chest    Lying supine, bend involved knee to chest and hold for 10 seconds, letting back muscles relax. Do 5 repetitions.  Repeat with other leg. Do _2-3__ times per day.  Copyright  VHI. All rights reserved.    Continue to lie on your stomach and to lie on your stomach propped up on your elbows for a couple of minutes several times a day.

## 2015-03-30 ENCOUNTER — Other Ambulatory Visit: Payer: Self-pay | Admitting: *Deleted

## 2015-03-30 MED ORDER — GABAPENTIN 250 MG/5ML PO SOLN
300.0000 mg | Freq: Every day | ORAL | Status: DC
Start: 2015-03-30 — End: 2015-06-28

## 2015-03-30 NOTE — Telephone Encounter (Signed)
Pt called to request refill on dexamethasone and gabapentin.  Per Dr. [name], pt to stop taking dexamethasone at this time.  OK to refill gabapentin.  Pt notified of change/verbalized understanding.

## 2015-04-04 ENCOUNTER — Other Ambulatory Visit: Payer: Self-pay | Admitting: *Deleted

## 2015-04-04 ENCOUNTER — Telehealth: Payer: Self-pay | Admitting: *Deleted

## 2015-04-04 MED ORDER — OXYCODONE HCL 5 MG PO TABS: 5.0000 mg | ORAL_TABLET | ORAL | Status: DC | PRN

## 2015-04-04 NOTE — Telephone Encounter (Signed)
Voicemail: "Since I spoke to you all last, I've run a low grade fever off and on.  I want to sleep all the time and real lackadaisical.  GI distress.  Could one of the girls call [phone]."

## 2015-04-04 NOTE — Telephone Encounter (Signed)
Pt called reporting low grade temp (up to 100.8) for approximately 1 week.  Pt also c/o lethargy, decreased appetite, intermittent diarrhea, and some SOB.  Reviewed symptoms with Dr. [name].  Per Dr. [name], patient should be evaluated prior to chemo appointment on 1/27.  POF placed.  Pt instructed to call for worsening symptoms.  Pt verbalized understanding.

## 2015-04-05 ENCOUNTER — Ambulatory Visit: Payer: Self-pay | Admitting: Hematology

## 2015-04-05 ENCOUNTER — Telehealth: Payer: Self-pay | Admitting: Hematology

## 2015-04-05 NOTE — Telephone Encounter (Signed)
per pof to sch pt appt-cld pt and left message to adv of time & date of appt

## 2015-04-06 NOTE — Progress Notes (Signed)
.    Hematology oncology clinic note  Date of service .03/18/2015  Patient Care Team: [name], MD as PCP - General (Family Medicine) [name], MD as PCP - Hematology/Oncology (Hematology and Oncology) [name], MD as Consulting Physician (Nephrology)  CHIEF COMPLAINTS: followup for AL amyloidosis.  HISTORY OF PRESENTING ILLNESS: Please see my initial consultation for details on presentation.  INTERVAL HISTORY  [name] is here for his scheduled 2 week follow-up. He has nearly completed his planned 6 months of treatment. [facility] and he'll be touching base with them again to make a final decision regarding whether he would want to see it to HSCT. He notes that he understands the decision involved but notes that it has been difficult for him. No new worsening tingling or numbness. No fevers or chills. No skin rashes. Some treatment-related fatigue grade 1. He had a good holiday season.   MEDICAL HISTORY:  Past Medical History  Diagnosis Date  . Hypertension Dx 2012  . Hyperlipidemia Dx 2012  . History of blood transfusion 09/08/2014    "got hematoma after renal biopsy & HgB dropped"  . Anemia Dx 2016  . Family history of adverse reaction to anesthesia     "my daughter can't take certain anesthesia agents" (09/08/2014)  . Enlarged heart 2015  . Chronic diastolic CHF (congestive heart failure), NYHA class 1 ([place])     /hospital problem list 09/08/2014  . Renal disorder     "there is a spot on my kidney" (09/08/2014)  . Chronic renal disease, stage III     /hospital problem list 09/08/2014    SURGICAL HISTORY: Past Surgical History  Procedure Laterality Date  . Renal biopsy, percutaneous Right 09/08/2014  . Tonsillectomy  ~ 1960  . Laparoscopic cholecystectomy  03/2011    SOCIAL HISTORY: Social History   Social History  . Marital Status: Divorced   Spouse Name: N/A  . Number of Children: N/A  . Years of Education: N/A   Occupational History  . Not on file.   Social History Main Topics  . Smoking status: Former Smoker -- 2.00 packs/day for 40 years    Types: Cigarettes    Quit date: 03/19/2011  . Smokeless tobacco: Never Used  . Alcohol Use: No  . Drug Use: No     Comment: 09/08/2014 "I have used marijuana till the 1990's". Notes that he quit 15 yrs ago  . Sexual Activity: Not Currently   Other Topics Concern  . 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 Lives with his daughter and her finance.  FAMILY HISTORY: Family History  Problem Relation Age of Onset  . Hypertension Mother   . Diabetes Mother   . Heart disease Mother   . Skin cancer Mother   . Hypertension Father   . Diabetes Father   . Diabetes Sister     ALLERGIES:  has No Known Allergies.  MEDICATIONS:  Current Outpatient Prescriptions  Medication Sig Dispense Refill  . amLODipine (NORVASC) 10 MG tablet Take 1 tablet (10 mg total) by mouth daily. 30 tablet 3  . aspirin EC 81 MG tablet Take 1 tablet (81 mg total) by mouth daily. 30 tablet 11  . Cyanocobalamin (B-12 COMPLIANCE INJECTION IJ) Inject 1 mL as directed every 30 (thirty) days.    [name] FLONASE 50 MCG/ACT nasal spray Place 1 spray into  both nostrils daily. 15.8 g 0  . furosemide (LASIX) 40 MG tablet 80 mg Monday, Tuesday, Wednesay (Patient taking differently: Take 80 mg by mouth 2 (two) times daily. ) 90 tablet 1  . hydrALAZINE (APRESOLINE) 50 MG tablet TAKE 1 TABLET BY MOUTH 3 TIMES DAILY. 120 tablet 2  . LORazepam (ATIVAN) 1 MG tablet Take 1 tablet (1 mg total) by mouth every 8 (eight) hours as needed for anxiety (or nausea). 30 tablet 0  . metoprolol (LOPRESSOR) 100 MG tablet TAKE 1 TABLET BY MOUTH 2 TIMES A DAY 60 tablet 3  . vitamin E 100 UNIT capsule Take 100 Units by mouth every other day.     . gabapentin (NEURONTIN) 250  MG/5ML solution Take 6 mLs (300 mg total) by mouth at bedtime. May increased to '400mg'$  po HS if still having neuropathic pain 240 mL 1  . oxyCODONE (OXY IR/ROXICODONE) 5 MG immediate release tablet Take 1 tablet (5 mg total) by mouth every 4 (four) hours as needed for severe pain. 30 tablet 0   No current facility-administered medications for this visit.    REVIEW OF SYSTEMS:   Constitutional: Denies fevers, chills or abnormal night sweats Eyes: Denies blurriness of vision, double vision or watery eyes Ears, nose, mouth, throat, and face: Denies mucositis or sore throat Respiratory: Denies cough, dyspnea or wheezes Cardiovascular: Denies palpitation, chest discomfort or lower extremity swelling Gastrointestinal:  Denies nausea, heartburn or change in bowel habits Skin: Denies abnormal skin rashes Lymphatics: Denies new lymphadenopathy or easy bruising Neurological:Denies numbness, tingling or new weaknesses Behavioral/Psych: Mood is stable, no new changes  All other systems were reviewed with the patient and are negative.   PHYSICAL EXAMINATION: ECOG PERFORMANCE STATUS: 2 - Symptomatic, <50% confined to bed  Filed Vitals:   03/18/15 0931  BP: 148/58  Pulse: 70  Temp: 98.2 F (36.8 C)  Resp: 16   There were no vitals filed for this visit.  GENERAL:alert, no distress , appears to be in good spirits and is smiling and quite verbal . SKIN: significant conjunctival pallor noted.  EYES: nl EOM, PERL OROPHARYNX:no exudate, no erythema and lips, buccal mucosa, and tongue normal  NECK: no JVD LYMPH:  no palpable lymphadenopathy in the cervical, axillary or inguinal LUNGS: clear to auscultation and percussion with normal breathing effort HEART: regular rate & rhythm and 2/6 SM aortic area ABDOMEN:abdomen soft, non-tender and normal bowel sounds Musculoskeletal: B/l 1+ pitting pedal edema in the left leg and 1+ pedal edema on the right leg. No Tenderness. No redness.  PSYCH: alert &  oriented x 3 with fluent speech NEURO: no focal motor/sensory deficits  LABORATORY DATA:  . CBC Latest Ref Rng 03/18/2015 03/11/2015  WBC 4.0 - 10.3 10e3/uL 5.8 6.1  Hemoglobin 13.0 - 17.1 g/dL 10.2(L) 9.9(L)  Hematocrit 38.4 - 49.9 % 30.4(L) 30.3(L)  Platelets 140 - 400 10e3/uL 138(L) 146    . CMP Latest Ref Rng 03/18/2015  Glucose 70 - 140 mg/dl 103  BUN 7.0 - 26.0 mg/dL 44.7(H)  Creatinine 0.7 - 1.3 mg/dL 3.0(HH)  Sodium 136 - 145 mEq/L 139  Potassium 3.5 - 5.1 mEq/L 4.0  Chloride 101 - 111 mmol/L -  CO2 22 - 29 mEq/L 19(L)  Calcium 8.4 - 10.4 mg/dL 9.0  Total Protein 6.0 - 8.5 g/dL 6.3(L)  Total Bilirubin 0.20 - 1.20 mg/dL 0.34  Alkaline Phos 40 - 150 U/L 76  AST 5 - 34 U/L 13  ALT 0 - 55 U/L 9  RADIOGRAPHIC STUDIES: I have personally reviewed the radiological images as listed and agreed with the findings in the report.  .No results found.   Echocardiogram 10/07/2014  Study Conclusions  - Left ventricle: The cavity size was normal. Wall thickness was increased in a pattern of moderate LVH. There was mild focal basal hypertrophy of the septum. Systolic function was normal. The estimated ejection fraction was in the range of 55% to 60%. Wall motion was normal; there were no regional wall motion abnormalities. Features are consistent with a pseudonormal left ventricular filling pattern, with concomitant abnormal relaxation and increased filling pressure (grade 2 diastolic dysfunction). - Aortic valve: There was very mild stenosis. There was mild regurgitation. Valve area (VTI): 2.67 cm^2. Valve area (Vmax): 2.49 cm^2. Valve area (Vmean): 2.3 cm^2. - Mitral valve: There was mild regurgitation.  Impressions:  - Normal LV function; grade 2 diastolic dysfunction; mild to moderate LVH with proximal septal thickening; calcified aortic valve with very mild AS (mean gradient 10 mmHg) and mild AI; mild MR; trace TR. Global longitudinal  strain - 17%.   ASSESSMENT & PLAN:    Mr Jose Garrett is very pleasant 65 yo caucasian male with   1) AL Amyloidosis noted on recent renal biopsy with nephrotic range proteinuria on urinalysis and some lambda free light chains on serum and urinary IFE . On diagnosis:  SPEP with small amount of M-spike 0.3  Echo showed no systolic dysfunction but grade 2 diastolic dysfunction. [facility] cardiac staging 1 as per troponin and BNP criteria. Bone marrow with no overt evidence of multiple myeloma. PET/CT scan showed no evidence of bony lesions or lymphadenopathy . Urine showed about 15 g of protein in 24 hours   After 1 month of treatment the M-spike is down to 0.1gm/dl and urinary total protein is down to about 7.6g/24hours  After 2 months of treatment: M-spike in 0.1g/dl lambda free light chains improved and urinary total protein down to about 4 g per 24 hours With no monoclonal free light chains in the urine.  After 3 months of treatment: M spike still 0.1g/dl, difference in serum free light chains less than 4, urine protein down to 7222 mg per 24 hours which is less than 50% of the initial suggesting good renal response. Overall patient appears to also have partial to very good partial response based on his systemic markers of AL amyloidosis.  After 4 months of treatment: M protein spike 0.1g/dl ..still with significant proteinuria 9.5g/dl  After nearly 6 months of treatment: M protein 0.1g/dl (IgG with Lambda light chains), 24 hour UIFE total protein 7371 mg/day.  Has had hematologic response based on difference in [facility] <4 Plan -Patient's response toCyBorD seems to have plateaued at this time. -Still having fair amount of proteinuria unclear if that would improve with further treatment given that it could be from his existent kidney injury. -Continue following with nephrology. Patient will follow-up with [facility] to make a final decision regarding  autologous Hematopoietic stem  cell transplant. -We will set him up for maintenance Velcade every 2 weeks starting from 04/08/2015 pending his final decision.  2) multifactorial anemia - likely multifactorial -from AL amyloidosis, anemia from CKD, chemotherapy. Stable with darbepoetin.  3) CKD -related to hypertension and AL Amyloidosis. Creatinine is in the 2.7 to 3 range and is improved from his previous high of 3.5. Decreasing proteinuria has now plateaued with current treatment. 4) Feet tingling and numbness ? Related to AL Amyloidosis vs Velcade vs B12 deficiency  stable. Started taking gabapentin 12/16/2014. Much improved symptoms. 5)Bilateral pedal edema improved since last visit. Ultrasound of bilateral lower extremity is negative for DVT. 6) acute bacterial sinusitis.-Resolved with antibiotics  Plan -Continue current dose of Lasix with close monitoring of daily weights. -Continue darbepoetin 40 g every 28 days -Continue subQ  B12 daily 28 days  Switching to maintenance Velcade pending final decision regarding autologous HSCT . -Patient will get CBC, CMP q2weekly and myeloma labs every monthly.  -continue neurontin for neuropathy.  -continue on low-dose aspirin for VTE prophylaxis given his high risk of venous thromboembolism in the setting of nephrotic syndrome.  -Continue close f/u with PCP and nephrology.  Return to care with Dr. [name] 04/07/2014 with rpt labs.  All questions were answered. The patient knows to call the clinic with any problems, questions or concerns. I spent 15 minutes counseling the patient face to face. The total time spent in the appointment was 20 minutes and more than 50% was on counseling.   Sullivan Lone MD [place] Hematology/Oncology Physician [facility]  (Office):       [phone] (Work cell):  [phone] (Fax):           [phone]

## 2015-04-08 ENCOUNTER — Ambulatory Visit (HOSPITAL_BASED_OUTPATIENT_CLINIC_OR_DEPARTMENT_OTHER): Payer: Self-pay | Admitting: Hematology

## 2015-04-08 ENCOUNTER — Ambulatory Visit: Payer: Self-pay

## 2015-04-08 ENCOUNTER — Telehealth: Payer: Self-pay | Admitting: Hematology

## 2015-04-08 ENCOUNTER — Other Ambulatory Visit: Payer: Self-pay

## 2015-04-08 ENCOUNTER — Encounter: Payer: Self-pay | Admitting: Hematology

## 2015-04-08 ENCOUNTER — Ambulatory Visit (HOSPITAL_BASED_OUTPATIENT_CLINIC_OR_DEPARTMENT_OTHER): Payer: Self-pay

## 2015-04-08 ENCOUNTER — Other Ambulatory Visit (HOSPITAL_BASED_OUTPATIENT_CLINIC_OR_DEPARTMENT_OTHER): Payer: Self-pay

## 2015-04-08 VITALS — BP 147/53 | HR 70 | Temp 97.8°F | Resp 18 | Ht 68.0 in | Wt 182.9 lb

## 2015-04-08 DIAGNOSIS — E8581 Light chain (AL) amyloidosis: Secondary | ICD-10-CM

## 2015-04-08 DIAGNOSIS — D649 Anemia, unspecified: Secondary | ICD-10-CM

## 2015-04-08 DIAGNOSIS — R609 Edema, unspecified: Secondary | ICD-10-CM

## 2015-04-08 DIAGNOSIS — E858 Other amyloidosis: Secondary | ICD-10-CM

## 2015-04-08 DIAGNOSIS — N183 Chronic kidney disease, stage 3 unspecified: Secondary | ICD-10-CM

## 2015-04-08 DIAGNOSIS — Z5112 Encounter for antineoplastic immunotherapy: Secondary | ICD-10-CM

## 2015-04-08 DIAGNOSIS — R200 Anesthesia of skin: Secondary | ICD-10-CM

## 2015-04-08 DIAGNOSIS — I129 Hypertensive chronic kidney disease with stage 1 through stage 4 chronic kidney disease, or unspecified chronic kidney disease: Secondary | ICD-10-CM

## 2015-04-08 LAB — CBC & DIFF AND RETIC
BASO%: 0.4 % (ref 0.0–2.0)
BASOS ABS: 0 10*3/uL (ref 0.0–0.1)
EOS%: 10.2 % — AB (ref 0.0–7.0)
Eosinophils Absolute: 0.5 10*3/uL (ref 0.0–0.5)
HEMATOCRIT: 25.7 % — AB (ref 38.4–49.9)
HGB: 8.7 g/dL — ABNORMAL LOW (ref 13.0–17.1)
Immature Retic Fract: 4.3 % (ref 3.00–10.60)
LYMPH%: 7.7 % — ABNORMAL LOW (ref 14.0–49.0)
MCH: 29.4 pg (ref 27.2–33.4)
MCHC: 33.9 g/dL (ref 32.0–36.0)
MCV: 86.8 fL (ref 79.3–98.0)
MONO#: 0.6 10*3/uL (ref 0.1–0.9)
MONO%: 11.9 % (ref 0.0–14.0)
NEUT#: 3.3 10*3/uL (ref 1.5–6.5)
NEUT%: 69.8 % (ref 39.0–75.0)
PLATELETS: 161 10*3/uL (ref 140–400)
RBC: 2.96 10*6/uL — AB (ref 4.20–5.82)
RDW: 15.1 % — AB (ref 11.0–14.6)
RETIC %: 1.21 % (ref 0.80–1.80)
Retic Ct Abs: 35.82 10*3/uL (ref 34.80–93.90)
WBC: 4.7 10*3/uL (ref 4.0–10.3)
lymph#: 0.4 10*3/uL — ABNORMAL LOW (ref 0.9–3.3)

## 2015-04-08 LAB — COMPREHENSIVE METABOLIC PANEL
ALT: 10 U/L (ref 0–55)
ANION GAP: 13 meq/L — AB (ref 3–11)
AST: 12 U/L (ref 5–34)
Albumin: 2.7 g/dL — ABNORMAL LOW (ref 3.5–5.0)
Alkaline Phosphatase: 66 U/L (ref 40–150)
BUN: 37.4 mg/dL — ABNORMAL HIGH (ref 7.0–26.0)
CALCIUM: 8.8 mg/dL (ref 8.4–10.4)
CHLORIDE: 112 meq/L — AB (ref 98–109)
CO2: 17 mEq/L — ABNORMAL LOW (ref 22–29)
Creatinine: 2.9 mg/dL — ABNORMAL HIGH (ref 0.7–1.3)
EGFR: 22 mL/min/{1.73_m2} — ABNORMAL LOW (ref >90)
Glucose: 111 mg/dl (ref 70–140)
POTASSIUM: 3.2 meq/L — AB (ref 3.5–5.1)
Sodium: 141 mEq/L (ref 136–145)
Total Bilirubin: 0.31 mg/dL (ref 0.20–1.20)
Total Protein: 6.2 g/dL — ABNORMAL LOW (ref 6.4–8.3)

## 2015-04-08 MED ORDER — ONDANSETRON HCL 8 MG PO TABS
ORAL_TABLET | ORAL | Status: AC
  Filled 2015-04-08: qty 1

## 2015-04-08 MED ORDER — BORTEZOMIB CHEMO SQ INJECTION 3.5 MG (2.5MG/ML)
1.2500 mg/m2 | Freq: Once | INTRAMUSCULAR | Status: AC
  Administered 2015-04-08: 2.5 mg via SUBCUTANEOUS
  Filled 2015-04-08: qty 2.5

## 2015-04-08 MED ORDER — ONDANSETRON HCL 8 MG PO TABS
8.0000 mg | ORAL_TABLET | Freq: Once | ORAL | Status: AC
  Administered 2015-04-08: 8 mg via ORAL

## 2015-04-08 NOTE — Progress Notes (Signed)
Ok to treat with CMP per MD Kale 

## 2015-04-08 NOTE — Progress Notes (Signed)
.    Hematology oncology clinic note  Date of service 04/08/2015   Patient Care Team: [name], MD as PCP - General (Family Medicine) [name], MD as PCP - Hematology/Oncology (Hematology and Oncology) [name], MD as Consulting Physician (Nephrology)  CHIEF COMPLAINTS: followup for AL amyloidosis.  HISTORY OF PRESENTING ILLNESS: Please see my initial consultation for details on presentation.  INTERVAL HISTORY  Jose Garrett is here for his scheduled 2 week follow-up. He notes that he had some low grade fevers and fatigue several days back but this had since resolved. Notes that he is feeling like he is getting back to his recent baseline again. He is to start his maintenance velcade from today. We discussed that now being off dexamethasone might take a little getting used to as well. Has decided not to proceed with Auto HSCT at this time. No other new focal symptoms.  MEDICAL HISTORY:  Past Medical History  Diagnosis Date  . Hypertension Dx 2012  . Hyperlipidemia Dx 2012  . History of blood transfusion 09/08/2014    "got hematoma after renal biopsy & HgB dropped"  . Anemia Dx 2016  . Family history of adverse reaction to anesthesia     "my daughter can't take certain anesthesia agents" (09/08/2014)  . Enlarged heart 2015  . Chronic diastolic CHF (congestive heart failure), NYHA class 1 (HCC)     /hospital problem list 09/08/2014  . Renal disorder     "there is a spot on my kidney" (09/08/2014)  . Chronic renal disease, stage III     /hospital problem list 09/08/2014    SURGICAL HISTORY: Past Surgical History  Procedure Laterality Date  . Renal biopsy, percutaneous Right 09/08/2014  . Tonsillectomy  ~ 1960  . Laparoscopic cholecystectomy  03/2011    SOCIAL HISTORY: Social History   Social History  . Marital Status: Divorced    Spouse Name: N/A  . Number of Children: N/A  . Years of Education: N/A   Occupational History  . Not on file.    Social History Main Topics  . Smoking status: Former Smoker -- 2.00 packs/day for 40 years    Types: Cigarettes    Quit date: 03/19/2011  . Smokeless tobacco: Never Used  . Alcohol Use: No  . Drug Use: No     Comment: 09/08/2014 "I have used marijuana till the 1990's". Notes that he quit 15 yrs ago  . Sexual Activity: Not Currently   Other Topics Concern  . Not on file   Social History Narrative   Previously worked Education officer, [employer]. Lives with his daughter and her finance.  FAMILY HISTORY: Family History  Problem Relation Age of Onset  . Hypertension Mother   . Diabetes Mother   . Heart disease Mother   . Skin cancer Mother   . Hypertension Father   . Diabetes Father   . Diabetes Sister     ALLERGIES:  has No Known Allergies.  MEDICATIONS:  Current Outpatient Prescriptions  Medication Sig Dispense Refill  . amLODipine (NORVASC) 10 MG tablet Take 1 tablet (10 mg total) by mouth daily. 30 tablet 3  . aspirin EC 81 MG tablet Take 1 tablet (81 mg total) by mouth daily. 30 tablet 11  . Cyanocobalamin (B-12 COMPLIANCE INJECTION IJ) Inject 1 mL as directed every 30 (thirty) days.    [name] FLONASE 50 MCG/ACT nasal spray Place 1 spray into both nostrils daily. 15.8 g 0  . furosemide (LASIX)  40 MG tablet 80 mg Monday, Tuesday, Wednesay (Patient taking differently: Take 80 mg by mouth 2 (two) times daily. ) 90 tablet 1  . gabapentin (NEURONTIN) 250 MG/5ML solution Take 6 mLs (300 mg total) by mouth at bedtime. May increased to '400mg'$  po HS if still having neuropathic pain 240 mL 1  . hydrALAZINE (APRESOLINE) 50 MG tablet TAKE 1 TABLET BY MOUTH 3 TIMES DAILY. 120 tablet 2  . LORazepam (ATIVAN) 1 MG tablet Take 1 tablet (1 mg total) by mouth every 8 (eight) hours as needed for anxiety (or nausea). 30 tablet 0  . metoprolol (LOPRESSOR) 100 MG tablet TAKE 1 TABLET BY MOUTH 2 TIMES A DAY 60 tablet 3  . oxyCODONE (OXY IR/ROXICODONE) 5 MG immediate release  tablet Take 1 tablet (5 mg total) by mouth every 4 (four) hours as needed for severe pain. 30 tablet 0  . vitamin E 100 UNIT capsule Take 100 Units by mouth every other day.      No current facility-administered medications for this visit.    REVIEW OF SYSTEMS:   Constitutional: Denies fevers, chills or abnormal night sweats Eyes: Denies blurriness of vision, double vision or watery eyes Ears, nose, mouth, throat, and face: Denies mucositis or sore throat Respiratory: Denies cough, dyspnea or wheezes Cardiovascular: Denies palpitation, chest discomfort or lower extremity swelling Gastrointestinal:  Denies nausea, heartburn or change in bowel habits Skin: Denies abnormal skin rashes Lymphatics: Denies new lymphadenopathy or easy bruising Neurological:Denies numbness, tingling or new weaknesses Behavioral/Psych: Mood is stable, no new changes  All other systems were reviewed with the patient and are negative.   PHYSICAL EXAMINATION: ECOG PERFORMANCE STATUS: 2 - Symptomatic, <50% confined to bed  Filed Vitals:   04/08/15 0837  BP: 147/53  Pulse: 70  Temp: 97.8 F (36.6 C)  Resp: 18   Filed Weights   04/08/15 0837  Weight: 182 lb 14.4 oz (82.963 kg)    GENERAL:alert, no distress , appears to be in good spirits and is smiling and quite verbal . SKIN: significant conjunctival pallor noted.  EYES: nl EOM, PERL OROPHARYNX:no exudate, no erythema and lips, buccal mucosa, and tongue normal  NECK: no JVD LYMPH:  no palpable lymphadenopathy in the cervical, axillary or inguinal LUNGS: clear to auscultation and percussion with normal breathing effort HEART: regular rate & rhythm and 2/6 SM aortic area ABDOMEN:abdomen soft, non-tender and normal bowel sounds Musculoskeletal: B/l trace pitting pedal edema No Tenderness. No redness.  PSYCH: alert & oriented x 3 with fluent speech NEURO: no focal motor/sensory deficits  LABORATORY DATA:  .[name] CBC Latest Ref Rng 04/08/2015 03/25/2015  03/18/2015  WBC 4.0 - 10.3 10e3/uL 4.7 5.0 5.8  Hemoglobin 13.0 - 17.1 g/dL 8.7(L) 9.9(L) 10.2(L)  Hematocrit 38.4 - 49.9 % 25.7(L) 28.7(L) 30.4(L)  Platelets 140 - 400 10e3/uL 161 131(L) 138(L)   . CMP Latest Ref Rng 04/08/2015 03/25/2015 03/25/2015  Glucose 70 - 140 mg/dl 111 105 -  BUN 7.0 - 26.0 mg/dL 37.4(H) 40.6(H) -  Creatinine 0.7 - 1.3 mg/dL 2.9(H) 2.9(H) -  Sodium 136 - 145 mEq/L 141 138 -  Potassium 3.5 - 5.1 mEq/L 3.2(L) 3.7 -  Chloride 101 - 111 mmol/L - - -  CO2 22 - 29 mEq/L 17(L) 20(L) -  Calcium 8.4 - 10.4 mg/dL 8.8 9.0 -  Total Protein 6.4 - 8.3 g/dL 6.2(L) 6.5 5.8(L)  Total Bilirubin 0.20 - 1.20 mg/dL 0.31 0.34 -  Alkaline Phos 40 - 150 U/L 66 74 -  AST  5 - 34 U/L 12 16 -  ALT 0 - 55 U/L 10 13 -   RADIOGRAPHIC STUDIES: I have personally reviewed the radiological images as listed and agreed with the findings in the report.  .No results found.   Echocardiogram 10/07/2014  Study Conclusions  - Left ventricle: The cavity size was normal. Wall thickness was increased in a pattern of moderate LVH. There was mild focal basal hypertrophy of the septum. Systolic function was normal. The estimated ejection fraction was in the range of 55% to 60%. Wall motion was normal; there were no regional wall motion abnormalities. Features are consistent with a pseudonormal left ventricular filling pattern, with concomitant abnormal relaxation and increased filling pressure (grade 2 diastolic dysfunction). - Aortic valve: There was very mild stenosis. There was mild regurgitation. Valve area (VTI): 2.67 cm^2. Valve area (Vmax): 2.49 cm^2. Valve area (Vmean): 2.3 cm^2. - Mitral valve: There was mild regurgitation.  Impressions:  - Normal LV function; grade 2 diastolic dysfunction; mild to moderate LVH with proximal septal thickening; calcified aortic valve with very mild AS (mean gradient 10 mmHg) and mild AI; mild Jose; trace TR. Global longitudinal  strain - 17%.   ASSESSMENT & PLAN:    Jose Garrett is very pleasant 65 yo caucasian male with   1) AL Amyloidosis noted on recent renal biopsy with nephrotic range proteinuria on urinalysis and some lambda free light chains on serum and urinary IFE . On diagnosis:  SPEP with small amount of M-spike 0.3  Echo showed no systolic dysfunction but grade 2 diastolic dysfunction. [facility] cardiac staging 1 as per troponin and BNP criteria. Bone marrow with no overt evidence of multiple myeloma. PET/CT scan showed no evidence of bony lesions or lymphadenopathy . Urine showed about 15 g of protein in 24 hours   After 1 month of treatment the M-spike is down to 0.1gm/dl and urinary total protein is down to about 7.6g/24hours  After 2 months of treatment: M-spike in 0.1g/dl lambda free light chains improved and urinary total protein down to about 4 g per 24 hours With no monoclonal free light chains in the urine.  After 3 months of treatment: M spike still 0.1g/dl, difference in serum free light chains less than 4, urine protein down to 7222 mg per 24 hours which is less than 50% of the initial suggesting good renal response. Overall patient appears to also have partial to very good partial response based on his systemic markers of AL amyloidosis.  After 4 months of treatment: M protein spike 0.1g/dl ..still with significant proteinuria 9.5g/dl  After nearly 6 months of treatment: M protein 0.1g/dl (IgG with Lambda light chains), 24 hour UIFE total protein 7371 mg/day.  Has had hematologic response based on difference in [facility] <4 Plan  -starting maintenance Velcade every 2 weeks starting from 04/08/2015 pending his final decision. -patient notes he does not want to pursue AUto HSCT at this time. -optimize nutrition  2) multifactorial anemia - likely multifactorial -from AL amyloidosis, anemia from CKD, chemotherapy. Hgb somewhat lower today -increased aranesp to 185mg qmonthly with holding  parameters.  3) CKD -related to hypertension and AL Amyloidosis. Creatinine is in the 2.7 to 3 range and is improved from his previous high of 3.5. Decreasing proteinuria has now plateaued with current treatment. 4) Feet tingling and numbness ? Related to AL Amyloidosis vs Velcade vs B12 deficiency stable. Started taking gabapentin 12/16/2014. Much improved symptoms. 5)Bilateral pedal edema improved since last visit. Ultrasound of bilateral lower  extremity is negative for DVT. 6) acute bacterial sinusitis.-Resolved with antibiotics  Plan -Continue current dose of Lasix with close monitoring of daily weights. -Continue darbepoetin 100 g every 28 days (dose increased) -Continue subQ  B12 daily 28 days - maintenance Velcade until evidence of disease progression. -Patient will get CBC, CMP q2weekly and myeloma labs every monthly.  -continue neurontin for neuropathy.  -continue on low-dose aspirin for VTE prophylaxis given his high risk of venous thromboembolism in the setting of nephrotic syndrome.  -Continue close f/u with PCP and nephrology.  Return to care with Dr. [name] in 1 month with cbc, cmp and rpt SPEP/QIG and kappa/lambda SFLC and urine protein  All questions were answered. The patient knows to call the clinic with any problems, questions or concerns. I spent 15 minutes counseling the patient face to face. The total time spent in the appointment was 20 minutes and more than 50% was on counseling.   Sullivan Lone MD [place] Hematology/Oncology Physician [facility]  (Office):       [phone] (Work cell):  [phone] (Fax):           [phone]

## 2015-04-08 NOTE — Patient Instructions (Signed)
[facility] Discharge Instructions for Patients Receiving Chemotherapy  Today you received the following chemotherapy agents Velcade  To help prevent nausea and vomiting after your treatment, we encourage you to take your nausea medication    If you develop nausea and vomiting that is not controlled by your nausea medication, call the clinic.   BELOW ARE SYMPTOMS THAT SHOULD BE REPORTED IMMEDIATELY:  *FEVER GREATER THAN 100.5 F  *CHILLS WITH OR WITHOUT FEVER  NAUSEA AND VOMITING THAT IS NOT CONTROLLED WITH YOUR NAUSEA MEDICATION  *UNUSUAL SHORTNESS OF BREATH  *UNUSUAL BRUISING OR BLEEDING  TENDERNESS IN MOUTH AND THROAT WITH OR WITHOUT PRESENCE OF ULCERS  *URINARY PROBLEMS  *BOWEL PROBLEMS  UNUSUAL RASH Items with * indicate a potential emergency and should be followed up as soon as possible.  Feel free to call the clinic you have any questions or concerns. The clinic phone number is [phone].  Please show the CHEMO ALERT CARD at check-in to the Emergency Department and triage nurse.   

## 2015-04-08 NOTE — Telephone Encounter (Signed)
Scheduled patient per pof, avs report printed.  °

## 2015-04-08 NOTE — Telephone Encounter (Signed)
per Darden Dates to CX 2/3 appt-gave pt updated copy o f avs

## 2015-04-11 LAB — MULTIPLE MYELOMA PANEL, SERUM
ALBUMIN SERPL ELPH-MCNC: 2.7 g/dL — AB (ref 2.9–4.4)
ALPHA2 GLOB SERPL ELPH-MCNC: 1.2 g/dL — AB (ref 0.4–1.0)
Albumin/Glob SerPl: 1 (ref 0.7–1.7)
Alpha 1: 0.4 g/dL (ref 0.0–0.4)
B-GLOBULIN SERPL ELPH-MCNC: 0.9 g/dL (ref 0.7–1.3)
GLOBULIN, TOTAL: 2.8 g/dL (ref 2.2–3.9)
Gamma Glob SerPl Elph-Mcnc: 0.3 g/dL — ABNORMAL LOW (ref 0.4–1.8)
IGG (IMMUNOGLOBIN G), SERUM: 320 mg/dL — AB (ref 700–1600)
IgA, Qn, Serum: 84 mg/dL (ref 61–437)
IgM, Qn, Serum: 54 mg/dL (ref 20–172)
TOTAL PROTEIN: 5.5 g/dL — AB (ref 6.0–8.5)

## 2015-04-11 LAB — KAPPA/LAMBDA LIGHT CHAINS
IG KAPPA FREE LIGHT CHAIN: 36.17 mg/L — AB (ref 3.30–19.40)
IG LAMBDA FREE LIGHT CHAIN: 24.17 mg/L (ref 5.71–26.30)
Kappa/Lambda FluidC Ratio: 1.5 (ref 0.26–1.65)

## 2015-04-13 NOTE — Telephone Encounter (Signed)
F/U scheduled 04-08-2015.

## 2015-04-15 ENCOUNTER — Ambulatory Visit: Payer: Self-pay | Admitting: Hematology

## 2015-04-15 ENCOUNTER — Other Ambulatory Visit: Payer: Self-pay

## 2015-04-22 ENCOUNTER — Ambulatory Visit (HOSPITAL_BASED_OUTPATIENT_CLINIC_OR_DEPARTMENT_OTHER): Payer: Self-pay

## 2015-04-22 ENCOUNTER — Other Ambulatory Visit (HOSPITAL_BASED_OUTPATIENT_CLINIC_OR_DEPARTMENT_OTHER): Payer: Self-pay

## 2015-04-22 VITALS — BP 148/60 | HR 65 | Temp 97.6°F | Resp 16

## 2015-04-22 DIAGNOSIS — E858 Other amyloidosis: Secondary | ICD-10-CM

## 2015-04-22 DIAGNOSIS — Z5112 Encounter for antineoplastic immunotherapy: Secondary | ICD-10-CM

## 2015-04-22 DIAGNOSIS — D649 Anemia, unspecified: Secondary | ICD-10-CM

## 2015-04-22 DIAGNOSIS — E8581 Light chain (AL) amyloidosis: Secondary | ICD-10-CM

## 2015-04-22 DIAGNOSIS — D638 Anemia in other chronic diseases classified elsewhere: Secondary | ICD-10-CM

## 2015-04-22 LAB — COMPREHENSIVE METABOLIC PANEL
ALBUMIN: 3.2 g/dL — AB (ref 3.5–5.0)
ALK PHOS: 87 U/L (ref 40–150)
ALT: 12 U/L (ref 0–55)
ANION GAP: 13 meq/L — AB (ref 3–11)
AST: 16 U/L (ref 5–34)
BILIRUBIN TOTAL: 0.35 mg/dL (ref 0.20–1.20)
BUN: 40 mg/dL — ABNORMAL HIGH (ref 7.0–26.0)
CALCIUM: 9.4 mg/dL (ref 8.4–10.4)
CO2: 19 mEq/L — ABNORMAL LOW (ref 22–29)
Chloride: 111 mEq/L — ABNORMAL HIGH (ref 98–109)
Creatinine: 3.1 mg/dL (ref 0.7–1.3)
EGFR: 20 mL/min/{1.73_m2} — AB (ref >90)
Glucose: 130 mg/dl (ref 70–140)
POTASSIUM: 4.4 meq/L (ref 3.5–5.1)
SODIUM: 143 meq/L (ref 136–145)
TOTAL PROTEIN: 6.8 g/dL (ref 6.4–8.3)

## 2015-04-22 LAB — CBC & DIFF AND RETIC
BASO%: 0.9 % (ref 0.0–2.0)
Basophils Absolute: 0.1 10*3/uL (ref 0.0–0.1)
EOS ABS: 0.5 10*3/uL (ref 0.0–0.5)
EOS%: 7.1 % — ABNORMAL HIGH (ref 0.0–7.0)
HEMATOCRIT: 28.9 % — AB (ref 38.4–49.9)
HEMOGLOBIN: 9.6 g/dL — AB (ref 13.0–17.1)
Immature Retic Fract: 8.1 % (ref 3.00–10.60)
LYMPH%: 10.4 % — AB (ref 14.0–49.0)
MCH: 30 pg (ref 27.2–33.4)
MCHC: 33.2 g/dL (ref 32.0–36.0)
MCV: 90.3 fL (ref 79.3–98.0)
MONO#: 0.8 10*3/uL (ref 0.1–0.9)
MONO%: 12 % (ref 0.0–14.0)
NEUT%: 69.6 % (ref 39.0–75.0)
NEUTROS ABS: 4.9 10*3/uL (ref 1.5–6.5)
PLATELETS: 131 10*3/uL — AB (ref 140–400)
RBC: 3.2 10*6/uL — ABNORMAL LOW (ref 4.20–5.82)
RDW: 15.9 % — ABNORMAL HIGH (ref 11.0–14.6)
Retic %: 2.31 % — ABNORMAL HIGH (ref 0.80–1.80)
Retic Ct Abs: 73.92 10*3/uL (ref 34.80–93.90)
WBC: 7 10*3/uL (ref 4.0–10.3)
lymph#: 0.7 10*3/uL — ABNORMAL LOW (ref 0.9–3.3)

## 2015-04-22 LAB — TECHNOLOGIST REVIEW

## 2015-04-22 MED ORDER — ONDANSETRON HCL 8 MG PO TABS
ORAL_TABLET | ORAL | Status: AC
  Filled 2015-04-22: qty 1

## 2015-04-22 MED ORDER — CYANOCOBALAMIN 1000 MCG/ML IJ SOLN
INTRAMUSCULAR | Status: AC
  Filled 2015-04-22: qty 1

## 2015-04-22 MED ORDER — BORTEZOMIB CHEMO SQ INJECTION 3.5 MG (2.5MG/ML)
1.2500 mg/m2 | Freq: Once | INTRAMUSCULAR | Status: AC
  Administered 2015-04-22: 2.5 mg via SUBCUTANEOUS
  Filled 2015-04-22: qty 2.5

## 2015-04-22 MED ORDER — CYANOCOBALAMIN 1000 MCG/ML IJ SOLN
1000.0000 ug | Freq: Once | INTRAMUSCULAR | Status: AC
  Administered 2015-04-22: 1000 ug via SUBCUTANEOUS

## 2015-04-22 MED ORDER — DARBEPOETIN ALFA 100 MCG/0.5ML IJ SOSY
100.0000 ug | PREFILLED_SYRINGE | Freq: Once | INTRAMUSCULAR | Status: AC
Start: 2015-04-22 — End: 2015-04-22
  Administered 2015-04-22: 100 ug via SUBCUTANEOUS
  Filled 2015-04-22: qty 0.5

## 2015-04-22 MED ORDER — ONDANSETRON HCL 8 MG PO TABS
8.0000 mg | ORAL_TABLET | Freq: Once | ORAL | Status: AC
  Administered 2015-04-22: 8 mg via ORAL

## 2015-04-22 NOTE — Progress Notes (Signed)
CMET reviewed with Dr. [name]. OK to treat despite creatinine 3.1. Pt reports he is scheduled to see nephrology early March.

## 2015-04-22 NOTE — Patient Instructions (Signed)
[place] Discharge Instructions for Patients Receiving Chemotherapy  Today you received the following chemotherapy agents: Velcade.  To help prevent nausea and vomiting after your treatment, we encourage you to take your nausea medication: Zofran. Take one every 8 hours as needed.    If you develop nausea and vomiting that is not controlled by your nausea medication, call the clinic.   BELOW ARE SYMPTOMS THAT SHOULD BE REPORTED IMMEDIATELY:  *FEVER GREATER THAN 100.5 F  *CHILLS WITH OR WITHOUT FEVER  NAUSEA AND VOMITING THAT IS NOT CONTROLLED WITH YOUR NAUSEA MEDICATION  *UNUSUAL SHORTNESS OF BREATH  *UNUSUAL BRUISING OR BLEEDING  TENDERNESS IN MOUTH AND THROAT WITH OR WITHOUT PRESENCE OF ULCERS  *URINARY PROBLEMS  *BOWEL PROBLEMS  UNUSUAL RASH Items with * indicate a potential emergency and should be followed up as soon as possible.  Feel free to call the clinic should you have any questions or concerns. The clinic phone number is [phone].  Please show the [place] at check-in to the Emergency Department and triage nurse.

## 2015-05-06 ENCOUNTER — Encounter: Payer: Self-pay | Admitting: Hematology

## 2015-05-06 ENCOUNTER — Other Ambulatory Visit: Payer: Self-pay | Admitting: *Deleted

## 2015-05-06 ENCOUNTER — Ambulatory Visit (HOSPITAL_BASED_OUTPATIENT_CLINIC_OR_DEPARTMENT_OTHER): Payer: Self-pay | Admitting: Hematology

## 2015-05-06 ENCOUNTER — Telehealth: Payer: Self-pay | Admitting: Hematology

## 2015-05-06 ENCOUNTER — Telehealth: Payer: Self-pay | Admitting: *Deleted

## 2015-05-06 ENCOUNTER — Ambulatory Visit (HOSPITAL_BASED_OUTPATIENT_CLINIC_OR_DEPARTMENT_OTHER): Payer: Self-pay

## 2015-05-06 ENCOUNTER — Other Ambulatory Visit (HOSPITAL_BASED_OUTPATIENT_CLINIC_OR_DEPARTMENT_OTHER): Payer: Self-pay

## 2015-05-06 VITALS — BP 164/55 | HR 64 | Temp 97.7°F | Resp 18 | Ht 68.0 in | Wt 189.6 lb

## 2015-05-06 DIAGNOSIS — E8581 Light chain (AL) amyloidosis: Secondary | ICD-10-CM

## 2015-05-06 DIAGNOSIS — E858 Other amyloidosis: Secondary | ICD-10-CM

## 2015-05-06 DIAGNOSIS — I129 Hypertensive chronic kidney disease with stage 1 through stage 4 chronic kidney disease, or unspecified chronic kidney disease: Secondary | ICD-10-CM

## 2015-05-06 DIAGNOSIS — N183 Chronic kidney disease, stage 3 unspecified: Secondary | ICD-10-CM

## 2015-05-06 DIAGNOSIS — M545 Low back pain: Secondary | ICD-10-CM

## 2015-05-06 DIAGNOSIS — R609 Edema, unspecified: Secondary | ICD-10-CM

## 2015-05-06 DIAGNOSIS — Z5112 Encounter for antineoplastic immunotherapy: Secondary | ICD-10-CM

## 2015-05-06 DIAGNOSIS — D631 Anemia in chronic kidney disease: Secondary | ICD-10-CM

## 2015-05-06 LAB — CBC & DIFF AND RETIC
BASO%: 0.8 % (ref 0.0–2.0)
BASOS ABS: 0 10*3/uL (ref 0.0–0.1)
EOS ABS: 0.4 10*3/uL (ref 0.0–0.5)
EOS%: 7.4 % — AB (ref 0.0–7.0)
HEMATOCRIT: 31.7 % — AB (ref 38.4–49.9)
HEMOGLOBIN: 10.3 g/dL — AB (ref 13.0–17.1)
IMMATURE RETIC FRACT: 4.2 % (ref 3.00–10.60)
LYMPH#: 1 10*3/uL (ref 0.9–3.3)
LYMPH%: 19.4 % (ref 14.0–49.0)
MCH: 29.9 pg (ref 27.2–33.4)
MCHC: 32.5 g/dL (ref 32.0–36.0)
MCV: 92.2 fL (ref 79.3–98.0)
MONO#: 0.4 10*3/uL (ref 0.1–0.9)
MONO%: 8 % (ref 0.0–14.0)
NEUT#: 3.3 10*3/uL (ref 1.5–6.5)
NEUT%: 64.4 % (ref 39.0–75.0)
PLATELETS: 121 10*3/uL — AB (ref 140–400)
RBC: 3.44 10*6/uL — ABNORMAL LOW (ref 4.20–5.82)
RDW: 16.8 % — AB (ref 11.0–14.6)
RETIC %: 2.41 % — AB (ref 0.80–1.80)
RETIC CT ABS: 82.9 10*3/uL (ref 34.80–93.90)
WBC: 5.1 10*3/uL (ref 4.0–10.3)

## 2015-05-06 LAB — COMPREHENSIVE METABOLIC PANEL
ANION GAP: 9 meq/L (ref 3–11)
AST: 15 U/L (ref 5–34)
Albumin: 3.5 g/dL (ref 3.5–5.0)
Alkaline Phosphatase: 72 U/L (ref 40–150)
BILIRUBIN TOTAL: 0.47 mg/dL (ref 0.20–1.20)
BUN: 35.1 mg/dL — AB (ref 7.0–26.0)
CALCIUM: 8.6 mg/dL (ref 8.4–10.4)
CHLORIDE: 114 meq/L — AB (ref 98–109)
CO2: 20 mEq/L — ABNORMAL LOW (ref 22–29)
CREATININE: 3 mg/dL — AB (ref 0.7–1.3)
EGFR: 21 mL/min/{1.73_m2} — ABNORMAL LOW (ref >90)
Glucose: 118 mg/dl (ref 70–140)
Potassium: 4.1 mEq/L (ref 3.5–5.1)
Sodium: 143 mEq/L (ref 136–145)
TOTAL PROTEIN: 6.1 g/dL — AB (ref 6.4–8.3)

## 2015-05-06 MED ORDER — BORTEZOMIB CHEMO SQ INJECTION 3.5 MG (2.5MG/ML)
1.2500 mg/m2 | Freq: Once | INTRAMUSCULAR | Status: AC
  Administered 2015-05-06: 2.5 mg via SUBCUTANEOUS
  Filled 2015-05-06: qty 2.5

## 2015-05-06 MED ORDER — ONDANSETRON HCL 8 MG PO TABS
8.0000 mg | ORAL_TABLET | Freq: Once | ORAL | Status: AC
  Administered 2015-05-06: 8 mg via ORAL

## 2015-05-06 MED ORDER — ONDANSETRON HCL 8 MG PO TABS
ORAL_TABLET | ORAL | Status: AC
  Filled 2015-05-06: qty 1

## 2015-05-06 NOTE — Patient Instructions (Signed)
[facility] Discharge Instructions for Patients Receiving Chemotherapy  Today you received the following chemotherapy agents Velcade. To help prevent nausea and vomiting after your treatment, we encourage you to take your nausea medication as directed.  If you develop nausea and vomiting that is not controlled by your nausea medication, call the clinic.   BELOW ARE SYMPTOMS THAT SHOULD BE REPORTED IMMEDIATELY:  *FEVER GREATER THAN 100.5 F  *CHILLS WITH OR WITHOUT FEVER  NAUSEA AND VOMITING THAT IS NOT CONTROLLED WITH YOUR NAUSEA MEDICATION  *UNUSUAL SHORTNESS OF BREATH  *UNUSUAL BRUISING OR BLEEDING  TENDERNESS IN MOUTH AND THROAT WITH OR WITHOUT PRESENCE OF ULCERS  *URINARY PROBLEMS  *BOWEL PROBLEMS  UNUSUAL RASH Items with * indicate a potential emergency and should be followed up as soon as possible.  Feel free to call the clinic you have any questions or concerns. The clinic phone number is [phone].  Please show the CHEMO ALERT CARD at check-in to the Emergency Department and triage nurse.    

## 2015-05-06 NOTE — Progress Notes (Signed)
Okay to treat with labs (Creatinine 3.0) per Darden Dates RN per Dr. [name].

## 2015-05-06 NOTE — Telephone Encounter (Signed)
Pt confirmed labs/ov per 02/24 POF,gave pt AVS and Calendar.Cherylann Banas, sent msg to add chemo

## 2015-05-06 NOTE — Progress Notes (Signed)
Ok to treat with Creatinine 3.0.

## 2015-05-06 NOTE — Telephone Encounter (Signed)
Per staff message and POF I have scheduled appts. Advised scheduler of appts. JMW  

## 2015-05-09 LAB — KAPPA/LAMBDA LIGHT CHAINS
Ig Kappa Free Light Chain: 41.42 mg/L — ABNORMAL HIGH (ref 3.30–19.40)
Ig Lambda Free Light Chain: 33.99 mg/L — ABNORMAL HIGH (ref 5.71–26.30)
Kappa/Lambda FluidC Ratio: 1.22 (ref 0.26–1.65)

## 2015-05-10 LAB — MULTIPLE MYELOMA PANEL, SERUM
ALPHA 1: 0.2 g/dL (ref 0.0–0.4)
ALPHA2 GLOB SERPL ELPH-MCNC: 1 g/dL (ref 0.4–1.0)
Albumin SerPl Elph-Mcnc: 3.3 g/dL (ref 2.9–4.4)
Albumin/Glob SerPl: 1.3 (ref 0.7–1.7)
B-GLOBULIN SERPL ELPH-MCNC: 0.9 g/dL (ref 0.7–1.3)
Gamma Glob SerPl Elph-Mcnc: 0.5 g/dL (ref 0.4–1.8)
Globulin, Total: 2.6 g/dL (ref 2.2–3.9)
IGG (IMMUNOGLOBIN G), SERUM: 364 mg/dL — AB (ref 700–1600)
IgA, Qn, Serum: 82 mg/dL (ref 61–437)
IgM, Qn, Serum: 220 mg/dL — ABNORMAL HIGH (ref 20–172)
M PROTEIN SERPL ELPH-MCNC: 0.2 g/dL — AB
TOTAL PROTEIN: 5.9 g/dL — AB (ref 6.0–8.5)

## 2015-05-11 LAB — UPEP/TP, 24-HR URINE
ALBUMIN, U: 74.5 %
ALPHA-1-GLOBULIN, U: 4.5 %
Alpha-2-Globulin, U: 5.8 %
BETA GLOBULIN, U: 10.9 %
GAMMA GLOBULIN, U: 4.3 %
PROTEIN,TOTAL,URINE: 431.2 mg/dL

## 2015-05-12 ENCOUNTER — Encounter: Payer: Self-pay | Admitting: Hematology

## 2015-05-12 NOTE — Progress Notes (Signed)
Left msg for pt to return my call regarding his ins situation and to see if he would like to reapply for financial assistance thru the hospital since his discount expired on 03/27/15.

## 2015-05-16 ENCOUNTER — Encounter: Payer: Self-pay | Admitting: Hematology

## 2015-05-16 NOTE — Progress Notes (Signed)
Pt returned my call, we discussed his discount that expired on 03/27/15.  Pt would like to reapply so he will complete the application and provided required documents on 05/20/15.

## 2015-05-18 NOTE — Progress Notes (Signed)
.    Hematology oncology clinic note  Date of service .05/06/2015   Patient Care Team: [name], MD as PCP - General (Family Medicine) [name], MD as PCP - Hematology/Oncology (Hematology and Oncology) [name], MD as Consulting Physician (Nephrology)  CHIEF COMPLAINTS: followup for AL amyloidosis.  HISTORY OF PRESENTING ILLNESS: Please see my initial consultation for details on presentation.  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 AL amyloidosis noted on kidney biopsy.  Had about 15 g of protein per 24 hours which produced a less than 50% suggesting renal response to treatment. - no overt heart failure   Current treatment - maintenance Velcade   previous treatment  CyBorD x 6 cycles Evaluated for and declined Auto HSCT at [facility] hospital.  INTERVAL HISTORY  [name] is here for his scheduled followup.  He notes he is doing well overall. His back pain is fairly well controlled. Has been trying to walk more. Refill on his Neurontin.  No other acute new concerns.[name]  MEDICAL HISTORY:  Past Medical History  Diagnosis Date  . Hypertension Dx 2012  . Hyperlipidemia Dx 2012  . History of blood transfusion 09/08/2014    "got hematoma after renal biopsy & HgB dropped"  . Anemia Dx 2016  . Family history of adverse reaction to anesthesia     "my daughter can't take certain anesthesia agents" (09/08/2014)  . Enlarged heart 2015  . Chronic diastolic CHF (congestive heart failure), NYHA class 1 (HCC)     /hospital problem list 09/08/2014  . Renal disorder     "there is a spot on my kidney" (09/08/2014)  . Chronic renal disease, stage III     /hospital problem list 09/08/2014    SURGICAL HISTORY: Past Surgical History  Procedure Laterality Date  . Renal biopsy, percutaneous Right 09/08/2014  . Tonsillectomy  ~ 1960  . Laparoscopic cholecystectomy  03/2011    SOCIAL HISTORY: Social History     Social History  . Marital Status: Divorced    Spouse Name: N/A  . Number of Children: N/A  . Years of Education: N/A   Occupational History  . Not on file.   Social History Main Topics  . Smoking status: Former Smoker -- 2.00 packs/day for 40 years    Types: Cigarettes    Quit date: 03/19/2011  . Smokeless tobacco: Never Used  . Alcohol Use: No  . Drug Use: No     Comment: 09/08/2014 "I have used marijuana till the 1990's". Notes that he quit 15 yrs ago  . Sexual Activity: Not Currently   Other Topics Concern  . Not on file   Social History Narrative   Previously worked Education officer, [employer]. Lives with his daughter and her finance.  FAMILY HISTORY: Family History  Problem Relation Age of Onset  . Hypertension Mother   . Diabetes Mother   . Heart disease Mother   . Skin cancer Mother   . Hypertension Father   . Diabetes Father   . Diabetes Sister     ALLERGIES:  has No Known Allergies.  MEDICATIONS:  Current Outpatient Prescriptions  Medication Sig Dispense Refill  . acyclovir (ZOVIRAX) 400 MG tablet Take 400 mg by mouth daily.    [name] amLODipine (NORVASC) 10 MG tablet Take 1 tablet (10 mg total) by mouth daily. 30 tablet 3  . aspirin EC 81 MG tablet Take 1 tablet (81 mg total) by mouth daily. 30  tablet 11  . Cyanocobalamin (B-12 COMPLIANCE INJECTION IJ) Inject 1 mL as directed every 30 (thirty) days.    [name] FLONASE 50 MCG/ACT nasal spray Place 1 spray into both nostrils daily. 15.8 g 0  . furosemide (LASIX) 40 MG tablet 80 mg Monday, Tuesday, Wednesay (Patient taking differently: Take 80 mg by mouth 2 (two) times daily. ) 90 tablet 1  . gabapentin (NEURONTIN) 250 MG/5ML solution Take 6 mLs (300 mg total) by mouth at bedtime. May increased to '400mg'$  po HS if still having neuropathic pain 240 mL 1  . hydrALAZINE (APRESOLINE) 50 MG tablet TAKE 1 TABLET BY MOUTH 3 TIMES DAILY. 120 tablet 2  . LORazepam (ATIVAN) 1 MG tablet Take 1 tablet (1 mg  total) by mouth every 8 (eight) hours as needed for anxiety (or nausea). 30 tablet 0  . metoprolol (LOPRESSOR) 100 MG tablet TAKE 1 TABLET BY MOUTH 2 TIMES A DAY 60 tablet 3  . ondansetron (ZOFRAN) 8 MG tablet Take 8 mg by mouth every 8 (eight) hours as needed for nausea or vomiting.    [name] oxyCODONE (OXY IR/ROXICODONE) 5 MG immediate release tablet Take 1 tablet (5 mg total) by mouth every 4 (four) hours as needed for severe pain. 30 tablet 0  . vitamin E 100 UNIT capsule Take 100 Units by mouth every other day.      No current facility-administered medications for this visit.    REVIEW OF SYSTEMS:   Constitutional: Denies fevers, chills or abnormal night sweats Eyes: Denies blurriness of vision, double vision or watery eyes Ears, nose, mouth, throat, and face: Denies mucositis or sore throat Respiratory: Denies cough, dyspnea or wheezes Cardiovascular: Denies palpitation, chest discomfort or lower extremity swelling Gastrointestinal:  Denies nausea, heartburn or change in bowel habits Skin: Denies abnormal skin rashes Lymphatics: Denies new lymphadenopathy or easy bruising Neurological:Denies numbness, tingling or new weaknesses Behavioral/Psych: Mood is stable, no new changes  All other systems were reviewed with the patient and are negative.   PHYSICAL EXAMINATION: ECOG PERFORMANCE STATUS: 2 - Symptomatic, <50% confined to bed  Filed Vitals:   05/06/15 1004  BP: 164/55  Pulse: 64  Temp: 97.7 F (36.5 C)  Resp: 18   Filed Weights   05/06/15 1004  Weight: 189 lb 9.6 oz (86.002 kg)    GENERAL:alert, no distress , appears to be in good spirits and is smiling and quite verbal . SKIN: significant conjunctival pallor noted.  EYES: nl EOM, PERL OROPHARYNX:no exudate, no erythema and lips, buccal mucosa, and tongue normal  NECK: no JVD LYMPH:  no palpable lymphadenopathy in the cervical, axillary or inguinal LUNGS: clear to auscultation and percussion with normal breathing  effort HEART: regular rate & rhythm and 2/6 SM aortic area ABDOMEN:abdomen soft, non-tender and normal bowel sounds Musculoskeletal: B/l trace pitting pedal edema No Tenderness. No redness.  PSYCH: alert & oriented x 3 with fluent speech NEURO: no focal motor/sensory deficits  LABORATORY DATA:  .[name] CBC Latest Ref Rng 05/06/2015 04/22/2015 04/08/2015  WBC 4.0 - 10.3 10e3/uL 5.1 7.0 4.7  Hemoglobin 13.0 - 17.1 g/dL 10.3(L) 9.6(L) 8.7(L)  Hematocrit 38.4 - 49.9 % 31.7(L) 28.9(L) 25.7(L)  Platelets 140 - 400 10e3/uL 121(L) 131(L) 161   . CMP Latest Ref Rng 05/06/2015 05/06/2015 04/22/2015  Glucose 70 - 140 mg/dl 118 - 130  BUN 7.0 - 26.0 mg/dL 35.1(H) - 40.0(H)  Creatinine 0.7 - 1.3 mg/dL 3.0(HH) - 3.1(HH)  Sodium 136 - 145 mEq/L 143 - 143  Potassium 3.5 -  5.1 mEq/L 4.1 - 4.4  CO2 22 - 29 mEq/L 20(L) - 19(L)  Calcium 8.4 - 10.4 mg/dL 8.6 - 9.4  Total Protein 6.4 - 8.3 g/dL 6.1(L) 5.9(L) 6.8  Total Bilirubin 0.20 - 1.20 mg/dL 0.47 - 0.35  Alkaline Phos 40 - 150 U/L 72 - 87  AST 5 - 34 U/L 15 - 16  ALT 0 - 55 U/L <9 - 12   RADIOGRAPHIC STUDIES: I have personally reviewed the radiological images as listed and agreed with the findings in the report.  .No results found.   Echocardiogram 10/07/2014  Study Conclusions  - Left ventricle: The cavity size was normal. Wall thickness was increased in a pattern of moderate LVH. There was mild focal basal hypertrophy of the septum. Systolic function was normal. The estimated ejection fraction was in the range of 55% to 60%. Wall motion was normal; there were no regional wall motion abnormalities. Features are consistent with a pseudonormal left ventricular filling pattern, with concomitant abnormal relaxation and increased filling pressure (grade 2 diastolic dysfunction). - Aortic valve: There was very mild stenosis. There was mild regurgitation. Valve area (VTI): 2.67 cm^2. Valve area (Vmax): 2.49 cm^2. Valve area (Vmean):  2.3 cm^2. - Mitral valve: There was mild regurgitation.  Impressions:  - Normal LV function; grade 2 diastolic dysfunction; mild to moderate LVH with proximal septal thickening; calcified aortic valve with very mild AS (mean gradient 10 mmHg) and mild AI; mild MR; trace TR. Global longitudinal strain - 17%.   ASSESSMENT & PLAN:    Mr Jose Garrett is very pleasant 65 yo caucasian male with   1) AL Amyloidosis noted on renal biopsy with nephrotic range proteinuria on urinalysis and some lambda free light chains on serum and urinary IFE . On diagnosis:  SPEP with small amount of M-spike 0.3  Echo showed no systolic dysfunction but grade 2 diastolic dysfunction. [facility] cardiac staging 1 as per troponin and BNP criteria. Bone marrow with no overt evidence of multiple myeloma. PET/CT scan showed no evidence of bony lesions or lymphadenopathy . Urine showed about 15 g of protein in 24 hours   After 1 month of treatment the M-spike is down to 0.1gm/dl and urinary total protein is down to about 7.6g/24hours  After 2 months of treatment: M-spike in 0.1g/dl lambda free light chains improved and urinary total protein down to about 4 g per 24 hours With no monoclonal free light chains in the urine.  After 3 months of treatment: M spike still 0.1g/dl, difference in serum free light chains less than 4, urine protein down to 7222 mg per 24 hours which is less than 50% of the initial suggesting good renal response. Overall patient appears to also have partial to very good partial response based on his systemic markers of AL amyloidosis.  After 4 months of treatment: M protein spike 0.1g/dl ..still with significant proteinuria 9.5g/dl  After nearly 6 months of treatment: M protein 0.1g/dl (IgG with Lambda light chains), 24 hour UIFE total protein 7371 mg/day.  Has had hematologic response based on difference in [facility] <4   Labs from 04/08/2015 showed M-protein not detected. IFE neg. Renal  function stable creatinine around 3. hgb improved. Plan  -starting maintenance Velcade every 2 weeks starting from today -patient notes he does not want to pursue Auto HSCT at this time. -optimize nutrition -UPEP pending -monitor for progression defined by - Free light chain increase of 50 percent to >100 mg/L. If patient achieved a CR previously, any  detectable M protein or abnormal FLC ratio (light chain must be double). If patient achieved a PR previously, 50 percent increase in serum M protein to >0.5 g/dL or 50 percent increase in urine M protein to >200 mg/day (a visible peak must be present).  2) multifactorial anemia - likely multifactorial -from AL amyloidosis, anemia from CKD, chemotherapy. Hgb improved to 10.3.  3) CKD -related to hypertension and AL Amyloidosis. Creatinine is in the 2.7 to 3 range and is improved from his previous high of 3.5. Decreasing proteinuria has now plateaued with current treatment. 4) Feet tingling and numbness ? Related to AL Amyloidosis vs Velcade vs B12 deficiency stable. Started taking gabapentin 12/16/2014. Much improved symptoms. 5)Bilateral pedal edema improved since last visit. Ultrasound of bilateral lower extremity is negative for DVT. 6) acute bacterial sinusitis.-Resolved with antibiotics  Plan -Continue current dose of Lasix with close monitoring of daily weights. -Continue subQ  B12 daily 28 days - maintenance Velcade until evidence of disease progression. -Patient will get CBC, CMP q2weekly and myeloma labs every monthly.  -f/u myeloma labs from today and UPEP -continue neurontin for neuropathy.  -continue on low-dose aspirin for VTE prophylaxis given his high risk of venous thromboembolism in the setting of nephrotic syndrome.  -Continue close f/u with PCP and nephrology.  Return to care with Dr. [name] in 1 month with cbc, cmp and rpt SPEP/QIG and kappa/lambda SFLC and urine protein  . Orders Placed This Encounter  Procedures  .  UPEP/TP, 24-Hr Urine    All questions were answered. The patient knows to call the clinic with any problems, questions or concerns. I spent 15 minutes counseling the patient face to face. The total time spent in the appointment was 20 minutes and more than 50% was on counseling.   Sullivan Lone MD [place] Hematology/Oncology Physician [facility]  (Office):       [phone] (Work cell):  [phone] (Fax):           [phone]

## 2015-05-20 ENCOUNTER — Ambulatory Visit (HOSPITAL_BASED_OUTPATIENT_CLINIC_OR_DEPARTMENT_OTHER): Payer: Self-pay

## 2015-05-20 ENCOUNTER — Encounter: Payer: Self-pay | Admitting: Hematology

## 2015-05-20 ENCOUNTER — Other Ambulatory Visit: Payer: Self-pay | Admitting: *Deleted

## 2015-05-20 ENCOUNTER — Telehealth: Payer: Self-pay | Admitting: Hematology

## 2015-05-20 ENCOUNTER — Other Ambulatory Visit: Payer: Self-pay | Admitting: Hematology

## 2015-05-20 ENCOUNTER — Other Ambulatory Visit (HOSPITAL_BASED_OUTPATIENT_CLINIC_OR_DEPARTMENT_OTHER): Payer: Self-pay

## 2015-05-20 VITALS — BP 153/60 | HR 62 | Temp 97.8°F | Resp 18 | Wt 192.1 lb

## 2015-05-20 DIAGNOSIS — D638 Anemia in other chronic diseases classified elsewhere: Secondary | ICD-10-CM

## 2015-05-20 DIAGNOSIS — E8581 Light chain (AL) amyloidosis: Secondary | ICD-10-CM

## 2015-05-20 DIAGNOSIS — E858 Other amyloidosis: Secondary | ICD-10-CM

## 2015-05-20 DIAGNOSIS — Z5112 Encounter for antineoplastic immunotherapy: Secondary | ICD-10-CM

## 2015-05-20 LAB — CBC & DIFF AND RETIC
BASO%: 0.4 % (ref 0.0–2.0)
BASOS ABS: 0 10*3/uL (ref 0.0–0.1)
EOS ABS: 0.4 10*3/uL (ref 0.0–0.5)
EOS%: 7.5 % — ABNORMAL HIGH (ref 0.0–7.0)
HCT: 30.7 % — ABNORMAL LOW (ref 38.4–49.9)
HGB: 10.2 g/dL — ABNORMAL LOW (ref 13.0–17.1)
Immature Retic Fract: 2 % — ABNORMAL LOW (ref 3.00–10.60)
LYMPH%: 14.9 % (ref 14.0–49.0)
MCH: 30.4 pg (ref 27.2–33.4)
MCHC: 33.2 g/dL (ref 32.0–36.0)
MCV: 91.4 fL (ref 79.3–98.0)
MONO#: 0.4 10*3/uL (ref 0.1–0.9)
MONO%: 8.5 % (ref 0.0–14.0)
NEUT%: 68.7 % (ref 39.0–75.0)
NEUTROS ABS: 3.3 10*3/uL (ref 1.5–6.5)
Platelets: 118 10*3/uL — ABNORMAL LOW (ref 140–400)
RBC: 3.36 10*6/uL — AB (ref 4.20–5.82)
RDW: 15.6 % — ABNORMAL HIGH (ref 11.0–14.6)
Retic %: 1.31 % (ref 0.80–1.80)
Retic Ct Abs: 44.02 10*3/uL (ref 34.80–93.90)
WBC: 4.8 10*3/uL (ref 4.0–10.3)
lymph#: 0.7 10*3/uL — ABNORMAL LOW (ref 0.9–3.3)

## 2015-05-20 LAB — COMPREHENSIVE METABOLIC PANEL
ALT: <9 U/L (ref 0–55)
AST: 13 U/L (ref 5–34)
Albumin: 3.6 g/dL (ref 3.5–5.0)
Alkaline Phosphatase: 71 U/L (ref 40–150)
Anion Gap: 10 mEq/L (ref 3–11)
BUN: 36.6 mg/dL — ABNORMAL HIGH (ref 7.0–26.0)
CO2: 19 meq/L — AB (ref 22–29)
Calcium: 8.6 mg/dL (ref 8.4–10.4)
Chloride: 112 mEq/L — ABNORMAL HIGH (ref 98–109)
Creatinine: 3.1 mg/dL (ref 0.7–1.3)
EGFR: 20 mL/min/{1.73_m2} — AB (ref >90)
GLUCOSE: 119 mg/dL (ref 70–140)
POTASSIUM: 3.8 meq/L (ref 3.5–5.1)
SODIUM: 141 meq/L (ref 136–145)
Total Bilirubin: 0.33 mg/dL (ref 0.20–1.20)
Total Protein: 6.6 g/dL (ref 6.4–8.3)

## 2015-05-20 MED ORDER — CYANOCOBALAMIN 1000 MCG/ML IJ SOLN
1000.0000 ug | Freq: Once | INTRAMUSCULAR | Status: AC
  Administered 2015-05-20: 1000 ug via SUBCUTANEOUS

## 2015-05-20 MED ORDER — ONDANSETRON HCL 8 MG PO TABS
ORAL_TABLET | ORAL | Status: AC
  Filled 2015-05-20: qty 1

## 2015-05-20 MED ORDER — CYANOCOBALAMIN 1000 MCG/ML IJ SOLN
INTRAMUSCULAR | Status: AC
  Filled 2015-05-20: qty 1

## 2015-05-20 MED ORDER — BORTEZOMIB CHEMO SQ INJECTION 3.5 MG (2.5MG/ML)
1.3000 mg/m2 | Freq: Once | INTRAMUSCULAR | Status: AC
  Administered 2015-05-20: 2.75 mg via SUBCUTANEOUS
  Filled 2015-05-20: qty 2.75

## 2015-05-20 MED ORDER — ONDANSETRON HCL 8 MG PO TABS
8.0000 mg | ORAL_TABLET | Freq: Once | ORAL | Status: AC
  Administered 2015-05-20: 8 mg via ORAL

## 2015-05-20 NOTE — Progress Notes (Signed)
Pt completed the financial application for renewal w/ the hospital.  Per Amedeo Gory , I sent application to pt accounting at [facility] to review & process by inner office mail.

## 2015-05-20 NOTE — Telephone Encounter (Signed)
perp ofto sch pt appt-cld & spoke to pt and gave pt appt time & date for 3/17 @12 

## 2015-05-20 NOTE — Progress Notes (Signed)
Ok to treat with creatinine 3.1 per MD [name]

## 2015-05-20 NOTE — Progress Notes (Signed)
faxed velcade application [id number]

## 2015-05-23 ENCOUNTER — Encounter: Payer: Self-pay | Admitting: Hematology

## 2015-05-23 NOTE — Progress Notes (Signed)
Per velcade asst.[place]anda-proof of income can't be used. She said letter from dr. Odessa Fleming can be. I will do on letterhead and get dr to sign and fax back

## 2015-05-26 ENCOUNTER — Telehealth: Payer: Self-pay | Admitting: Hematology

## 2015-05-26 ENCOUNTER — Encounter: Payer: Self-pay | Admitting: Hematology

## 2015-05-26 ENCOUNTER — Telehealth: Payer: Self-pay | Admitting: *Deleted

## 2015-05-26 ENCOUNTER — Telehealth: Payer: Self-pay | Admitting: Pharmacist

## 2015-05-26 ENCOUNTER — Ambulatory Visit (HOSPITAL_BASED_OUTPATIENT_CLINIC_OR_DEPARTMENT_OTHER): Payer: Self-pay | Admitting: Hematology

## 2015-05-26 VITALS — BP 169/64 | HR 72 | Temp 98.0°F | Resp 18 | Ht 68.0 in | Wt 194.0 lb

## 2015-05-26 DIAGNOSIS — E8581 Light chain (AL) amyloidosis: Secondary | ICD-10-CM

## 2015-05-26 DIAGNOSIS — I129 Hypertensive chronic kidney disease with stage 1 through stage 4 chronic kidney disease, or unspecified chronic kidney disease: Secondary | ICD-10-CM

## 2015-05-26 DIAGNOSIS — R200 Anesthesia of skin: Secondary | ICD-10-CM

## 2015-05-26 DIAGNOSIS — N189 Chronic kidney disease, unspecified: Secondary | ICD-10-CM

## 2015-05-26 DIAGNOSIS — D638 Anemia in other chronic diseases classified elsewhere: Secondary | ICD-10-CM

## 2015-05-26 DIAGNOSIS — E858 Other amyloidosis: Secondary | ICD-10-CM

## 2015-05-26 DIAGNOSIS — D631 Anemia in chronic kidney disease: Secondary | ICD-10-CM

## 2015-05-26 DIAGNOSIS — R609 Edema, unspecified: Secondary | ICD-10-CM

## 2015-05-26 DIAGNOSIS — D6481 Anemia due to antineoplastic chemotherapy: Secondary | ICD-10-CM

## 2015-05-26 DIAGNOSIS — R809 Proteinuria, unspecified: Secondary | ICD-10-CM

## 2015-05-26 MED ORDER — IXAZOMIB CITRATE 3 MG PO CAPS: 3.0000 mg | ORAL_CAPSULE | ORAL | Status: DC

## 2015-05-26 MED ORDER — OXYCODONE HCL 5 MG PO TABS: 5.0000 mg | ORAL_TABLET | ORAL | Status: DC | PRN

## 2015-05-26 NOTE — Telephone Encounter (Signed)
05/26/15: New Rx for Ninlaro sent to [facility] - pt has no insurance will need to apply for manufacture assistance.

## 2015-05-26 NOTE — Telephone Encounter (Signed)
Per staff message and POF I tried scheduled appts. No chemo to schedule per POF (but had care plan), also talked with desk RN told not to schedule chemo. Advised scheduler of appts. JMW

## 2015-05-26 NOTE — Telephone Encounter (Signed)
per pof to sch pt appt-sent MW emailto sch trmt-pt to getupdated copy b4 leaving** °

## 2015-05-27 ENCOUNTER — Ambulatory Visit: Payer: Self-pay | Admitting: Nurse Practitioner

## 2015-05-29 NOTE — Progress Notes (Signed)
.    Hematology oncology clinic note  Date of service .05/26/2015    Patient Care Team: [name], MD as PCP - General (Family Medicine) [name], MD as PCP - Hematology/Oncology (Hematology and Oncology) [name], MD as Consulting Physician (Nephrology)  CHIEF COMPLAINTS: followup for AL amyloidosis.  HISTORY OF PRESENTING ILLNESS: Please see my initial consultation for details on presentation.  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 AL amyloidosis noted on kidney biopsy.  Had about 15 g of protein per 24 hours which produced a less than 50% suggesting renal response to treatment. - no overt heart failure   Current treatment - maintenance Velcade   previous treatment  CyBorD x 6 cycles Evaluated for and declined Auto HSCT at [facility].  INTERVAL HISTORY  Jose Garrett is here for his followup to discuss his lab results and further treatment options. His lab results suggest hematologic and renal progression of his disease.  He notes grade 1-2 lower extremity neuropathy controlled with Neurontin.  No other acute new symptoms.  Blood counts are stable. No  New chest pain or shortness of breath.  Lower extremity swelling stable.  MEDICAL HISTORY:  Past Medical History  Diagnosis Date  . Hypertension Dx 2012  . Hyperlipidemia Dx 2012  . History of blood transfusion 09/08/2014    "got hematoma after renal biopsy & HgB dropped"  . Anemia Dx 2016  . Family history of adverse reaction to anesthesia     "my daughter can't take certain anesthesia agents" (09/08/2014)  . Enlarged heart 2015  . Chronic diastolic CHF (congestive heart failure), NYHA class 1 (HCC)     /hospital problem list 09/08/2014  . Renal disorder     "there is a spot on my kidney" (09/08/2014)  . Chronic renal disease, stage III     /hospital problem list 09/08/2014    SURGICAL HISTORY: Past Surgical History  Procedure  Laterality Date  . Renal biopsy, percutaneous Right 09/08/2014  . Tonsillectomy  ~ 1960  . Laparoscopic cholecystectomy  03/2011    SOCIAL HISTORY: Social History   Social History  . Marital Status: Divorced    Spouse Name: N/A  . Number of Children: N/A  . Years of Education: N/A   Occupational History  . Not on file.   Social History Main Topics  . Smoking status: Former Smoker -- 2.00 packs/day for 40 years    Types: Cigarettes    Quit date: 03/19/2011  . Smokeless tobacco: Never Used  . Alcohol Use: No  . Drug Use: No     Comment: 09/08/2014 "I have used marijuana till the 1990's". Notes that he quit 15 yrs ago  . Sexual Activity: Not Currently   Other Topics Concern  . Not on file   Social History Narrative   Previously worked Education officer, [employer]. Lives with his daughter and her finance.  FAMILY HISTORY: Family History  Problem Relation Age of Onset  . Hypertension Mother   . Diabetes Mother   . Heart disease Mother   . Skin cancer Mother   . Hypertension Father   . Diabetes Father   . Diabetes Sister     ALLERGIES:  has No Known Allergies.  MEDICATIONS:  Current Outpatient Prescriptions  Medication Sig Dispense Refill  . acyclovir (ZOVIRAX) 400 MG tablet Take 400 mg by mouth daily.    [name] amLODipine (NORVASC) 10 MG tablet Take 1 tablet (10 mg  total) by mouth daily. 30 tablet 3  . aspirin EC 81 MG tablet Take 1 tablet (81 mg total) by mouth daily. 30 tablet 11  . Cyanocobalamin (B-12 COMPLIANCE INJECTION IJ) Inject 1 mL as directed every 30 (thirty) days.    [name] FLONASE 50 MCG/ACT nasal spray Place 1 spray into both nostrils daily. 15.8 g 0  . furosemide (LASIX) 40 MG tablet 80 mg Monday, Tuesday, Wednesay (Patient taking differently: Take 80 mg by mouth 2 (two) times daily. ) 90 tablet 1  . gabapentin (NEURONTIN) 250 MG/5ML solution Take 6 mLs (300 mg total) by mouth at bedtime. May increased to '400mg'$  po HS if still having  neuropathic pain 240 mL 1  . hydrALAZINE (APRESOLINE) 50 MG tablet TAKE 1 TABLET BY MOUTH 3 TIMES DAILY. 120 tablet 2  . LORazepam (ATIVAN) 1 MG tablet Take 1 tablet (1 mg total) by mouth every 8 (eight) hours as needed for anxiety (or nausea). 30 tablet 0  . metoprolol (LOPRESSOR) 100 MG tablet TAKE 1 TABLET BY MOUTH 2 TIMES A DAY 60 tablet 3  . ondansetron (ZOFRAN) 8 MG tablet Take 8 mg by mouth every 8 (eight) hours as needed for nausea or vomiting.    [name] oxyCODONE (OXY IR/ROXICODONE) 5 MG immediate release tablet Take 1 tablet (5 mg total) by mouth every 4 (four) hours as needed for severe pain. 60 tablet 0  . vitamin E 100 UNIT capsule Take 100 Units by mouth every other day.     . ixazomib citrate (NINLARO) 3 MG capsule Take 1 capsule (3 mg total) by mouth once a week. Take on an empty stomach 1hr before or 2hrs after food. Do not crush, chew, or open. 4 capsule 1   No current facility-administered medications for this visit.    REVIEW OF SYSTEMS:   Constitutional: Denies fevers, chills or abnormal night sweats Eyes: Denies blurriness of vision, double vision or watery eyes Ears, nose, mouth, throat, and face: Denies mucositis or sore throat Respiratory: Denies cough, dyspnea or wheezes Cardiovascular: Denies palpitation, chest discomfort or lower extremity swelling Gastrointestinal:  Denies nausea, heartburn or change in bowel habits Skin: Denies abnormal skin rashes Lymphatics: Denies new lymphadenopathy or easy bruising Neurological:Denies numbness, tingling or new weaknesses Behavioral/Psych: Mood is stable, no new changes  All other systems were reviewed with the patient and are negative.   PHYSICAL EXAMINATION: ECOG PERFORMANCE STATUS: 2 - Symptomatic, <50% confined to bed  Filed Vitals:   05/26/15 1204  BP: 169/64  Pulse: 72  Temp: 98 F (36.7 C)  Resp: 18   Filed Weights   05/26/15 1204  Weight: 194 lb (87.998 kg)    GENERAL:alert, no distress , appears to be in  good spirits and is smiling and quite verbal . SKIN: significant conjunctival pallor noted.  EYES: nl EOM, PERL OROPHARYNX:no exudate, no erythema and lips, buccal mucosa, and tongue normal  NECK: no JVD LYMPH:  no palpable lymphadenopathy in the cervical, axillary or inguinal LUNGS: clear to auscultation and percussion with normal breathing effort HEART: regular rate & rhythm and 2/6 SM aortic area ABDOMEN:abdomen soft, non-tender and normal bowel sounds Musculoskeletal: B/l trace pitting pedal edema No Tenderness. No redness.  PSYCH: alert & oriented x 3 with fluent speech NEURO: no focal motor/sensory deficits  LABORATORY DATA:  .[name] CBC Latest Ref Rng 05/20/2015 05/06/2015 04/22/2015  WBC 4.0 - 10.3 10e3/uL 4.8 5.1 7.0  Hemoglobin 13.0 - 17.1 g/dL 10.2(L) 10.3(L) 9.6(L)  Hematocrit 38.4 - 49.9 %  30.7(L) 31.7(L) 28.9(L)  Platelets 140 - 400 10e3/uL 118(L) 121(L) 131(L)   . CMP Latest Ref Rng 05/20/2015 05/06/2015 05/06/2015  Glucose 70 - 140 mg/dl 119 118 -  BUN 7.0 - 26.0 mg/dL 36.6(H) 35.1(H) -  Creatinine 0.7 - 1.3 mg/dL 3.1(HH) 3.0(HH) -  Sodium 136 - 145 mEq/L 141 143 -  Potassium 3.5 - 5.1 mEq/L 3.8 4.1 -  CO2 22 - 29 mEq/L 19(L) 20(L) -  Calcium 8.4 - 10.4 mg/dL 8.6 8.6 -  Total Protein 6.4 - 8.3 g/dL 6.6 6.1(L) 5.9(L)  Total Bilirubin 0.20 - 1.20 mg/dL 0.33 0.47 -  Alkaline Phos 40 - 150 U/L 71 72 -  AST 5 - 34 U/L 13 15 -  ALT 0 - 55 U/L <9 <9 -   Component     Latest Ref Rng 03/25/2015 04/08/2015 05/06/2015  IgG (Immunoglobin G), Serum     700 - 1600 mg/dL 310 (L) 320 (L) 364 (L)  IgA/Immunoglobulin A, Serum     61 - 437 mg/dL 71 84 82  IgM (Immunoglobin M), Srm     20 - 172 mg/dL 64 54 220 (H)  Total Protein     6.0 - 8.5 g/dL 5.8 (L) 5.5 (L) 5.9 (L)  Albumin SerPl Elph-Mcnc     2.9 - 4.4 g/dL 2.9 2.7 (L) 3.3  Alpha 1     0.0 - 0.4 g/dL 0.4 0.4 0.2  Alpha2 Glob SerPl Elph-Mcnc     0.4 - 1.0 g/dL 1.3 (H) 1.2 (H) 1.0  B-Globulin SerPl Elph-Mcnc     0.7 - 1.3 g/dL  0.9 0.9 0.9  Gamma Glob SerPl Elph-Mcnc     0.4 - 1.8 g/dL 0.3 (L) 0.3 (L) 0.5  M Protein SerPl Elph-Mcnc     Not Observed g/dL 0.1 (H) Not Observed 0.2 (H)  Globulin, Total     2.2 - 3.9 g/dL 2.9 2.8 2.6  Albumin/Glob SerPl     0.7 - 1.7 1.1 1.0 1.3  IFE 1      Comment Comment Comment  Please Note (HCV):      Comment Comment Comment  Protein Urine Random     Not Estab. mg/dL     Prot,24hr calculated     30.0 - 150.0 mg/24 hr     ALBUMIN, U          Alpha-1-Globulin, U          ALPHA-2-GLOBULIN, U          Beta Globulin, U          Gamma Globulin, U          M-spike, %     Not Observed %     Ig Kappa Free Light Chain     3.30 - 19.40 mg/L  36.17 (H) 41.42 (H)  Ig Lambda Free Light Chain     5.71 - 26.30 mg/L  24.17 33.99 (H)  Kappa/Lambda FluidC Ratio     0.26 - 1.65  1.50 1.22   Component     Latest Ref Rng 05/10/2015  IgG (Immunoglobin G), Serum     700 - 1600 mg/dL   IgA/Immunoglobulin A, Serum     61 - 437 mg/dL   IgM (Immunoglobin M), Srm     20 - 172 mg/dL   Total Protein     6.0 - 8.5 g/dL   Albumin SerPl Elph-Mcnc     2.9 - 4.4 g/dL   Alpha 1     0.0 -  0.4 g/dL   Alpha2 Glob SerPl Elph-Mcnc     0.4 - 1.0 g/dL   B-Globulin SerPl Elph-Mcnc     0.7 - 1.3 g/dL   Gamma Glob SerPl Elph-Mcnc     0.4 - 1.8 g/dL   M Protein SerPl Elph-Mcnc     Not Observed g/dL   Globulin, Total     2.2 - 3.9 g/dL   Albumin/Glob SerPl     0.7 - 1.7   IFE 1        Please Note (HCV):      Comment  Protein Urine Random     Not Estab. mg/dL 431.2  Prot,24hr calculated     30.0 - 150.0 mg/24 hr 10,780.0 (H)  ALBUMIN, U      74.5  Alpha-1-Globulin, U      4.5  ALPHA-2-GLOBULIN, U      5.8  Beta Globulin, U      10.9  Gamma Globulin, U      4.3  M-spike, %     Not Observed % Not Observed  Ig Kappa Free Light Chain     3.30 - 19.40 mg/L   Ig Lambda Free Light Chain     5.71 - 26.30 mg/L   Kappa/Lambda FluidC Ratio     0.26 - 1.65     RADIOGRAPHIC  STUDIES: I have personally reviewed the radiological images as listed and agreed with the findings in the report.  .No results found.   Echocardiogram 10/07/2014  Study Conclusions  - Left ventricle: The cavity size was normal. Wall thickness was increased in a pattern of moderate LVH. There was mild focal basal hypertrophy of the septum. Systolic function was normal. The estimated ejection fraction was in the range of 55% to 60%. Wall motion was normal; there were no regional wall motion abnormalities. Features are consistent with a pseudonormal left ventricular filling pattern, with concomitant abnormal relaxation and increased filling pressure (grade 2 diastolic dysfunction). - Aortic valve: There was very mild stenosis. There was mild regurgitation. Valve area (VTI): 2.67 cm^2. Valve area (Vmax): 2.49 cm^2. Valve area (Vmean): 2.3 cm^2. - Mitral valve: There was mild regurgitation.  Impressions:  - Normal LV function; grade 2 diastolic dysfunction; mild to moderate LVH with proximal septal thickening; calcified aortic valve with very mild AS (mean gradient 10 mmHg) and mild AI; mild MR; trace TR. Global longitudinal strain - 17%.   ASSESSMENT & PLAN:    Mr Jose Garrett is very pleasant 65 yo caucasian male with   1) AL Amyloidosis noted on renal biopsy with nephrotic range proteinuria on urinalysis and some lambda free light chains on serum and urinary IFE . On diagnosis:  SPEP with small amount of M-spike 0.3  Echo showed no systolic dysfunction but grade 2 diastolic dysfunction. [facility] cardiac staging 1 as per troponin and BNP criteria. Bone marrow with no overt evidence of multiple myeloma. PET/CT scan showed no evidence of bony lesions or lymphadenopathy . Urine showed about 15 g of protein in 24 hours   After 1 month of treatment the M-spike is down to 0.1gm/dl and urinary total protein is down to about 7.6g/24hours  After 2 months of  treatment: M-spike in 0.1g/dl lambda free light chains improved and urinary total protein down to about 4 g per 24 hours With no monoclonal free light chains in the urine.  After 3 months of treatment: M spike still 0.1g/dl, difference in serum free light chains less than 4, urine protein down  to 7222 mg per 24 hours which is less than 50% of the initial suggesting good renal response. Overall patient appears to also have partial to very good partial response based on his systemic markers of AL amyloidosis.  After 4 months of treatment: M protein spike 0.1g/dl ..still with significant proteinuria 9.5g/dl  After nearly 6 months of treatment: M protein 0.1g/dl (IgG with Lambda light chains), 24 hour UIFE total protein 7371 mg/day.  Has had hematologic response based on difference in [facility] <4   Labs from 04/08/2015 showed M-protein not detected. IFE neg. Renal function stable creatinine around 3. hgb improved.  Latest labs:  Patient has signs of progression.  Urinary protein has increased to 10.78g/24 hours (no M protein) from about 7.38g/24 hours. M protein in the blood which was undetectable has increased to 0.2 g/dL Serum free light chains increasing. Plan -patient notes he does not want to pursue Auto HSCT at this time. -given patient had achieve CR with no M protein on SPEP and now has 0.2g/dl and there is a >25% increase in Urinary protein - would warrant treatment change. -we will switch to Ixazomib + Dexamethasone. (prescription written for Ixazomib '3mg'$  D1,8,15 q4weeks) -we shall plan to consider adding renally dose adjusted Revlimid if inadequate response noted. -We shall plan to rpt ECHO to evaluate for change in cardiac function.  2) multifactorial anemia - likely multifactorial -from AL amyloidosis, anemia from CKD, chemotherapy. Hgb improved to 10.3.  3) CKD -related to hypertension and AL Amyloidosis. Creatinine is in the 2.7 to 3 range and is improved from his previous high of  3.5. Decreasing proteinuria has now plateaued with current treatment. 4) Feet tingling and numbness ? Related to AL Amyloidosis vs Velcade vs B12 deficiency stable. Started taking gabapentin 12/16/2014. Much improved symptoms. 5)Bilateral pedal edema improved since last visit. Ultrasound of bilateral lower extremity is negative for DVT. 6) acute bacterial sinusitis.-Resolved with antibiotics  Plan -Continue current dose of Lasix with close monitoring of daily weights. -Continue subQ  B12 daily 28 days - switch to Ixazomib + Dexamethasone. -Patient will get CBC, CMP q2weekly and myeloma labs every monthly.  -continue neurontin for neuropathy.  -continue on low-dose aspirin for VTE prophylaxis given his high risk of venous thromboembolism in the setting of nephrotic syndrome.  -continue Acyclovir for shingles prophylaxis -Continue close f/u with PCP and nephrology.  Return to care with Dr. [name] in 3 weeks with cbc, cmp and rpt SPEP/QIG and kappa/lambda SFLC and urine protein  All questions were answered. The patient knows to call the clinic with any problems, questions or concerns. I spent 25 minutes counseling the patient face to face. The total time spent in the appointment was 25 minutes and more than 50% was on counseling.   Sullivan Lone MD [place] [facility]/Oncology Physician [facility]  (Office):       [phone] (Work cell):  [phone] (Fax):           [phone]

## 2015-05-30 ENCOUNTER — Telehealth: Payer: Self-pay | Admitting: Hematology

## 2015-05-30 ENCOUNTER — Encounter: Payer: Self-pay | Admitting: Pharmacist

## 2015-05-30 NOTE — Telephone Encounter (Signed)
cld pt and left message of ECHO appt time/date/location for 3/31 @ 11

## 2015-05-30 NOTE — Telephone Encounter (Signed)
per pof to sch pt ECHO-sent to [facility] for pre-cert-willc all after reply

## 2015-05-30 NOTE — Progress Notes (Signed)
Pt came to [facility] lobby to sign Enrollment Form for 1 [employer] assistance. Pt stated Lenise in finance here has original income statements.  This application did not require that information. Pt is concerned about the size of the capsule.  He stated he has a "terrible gag reflex."  He has trouble swallowing anything much larger than a baby ASA.  Enrollment Form faxed this morning.  Pending approval. Kennith Center, Pharm.D., CPP 05/30/2015@11 :54 AM

## 2015-05-31 ENCOUNTER — Encounter: Payer: Self-pay | Admitting: Hematology

## 2015-05-31 NOTE — Progress Notes (Signed)
Per nurse patient no longer on velcade. See prev notes no need for asst now.

## 2015-06-01 ENCOUNTER — Other Ambulatory Visit: Payer: Self-pay | Admitting: Hematology

## 2015-06-01 ENCOUNTER — Other Ambulatory Visit: Payer: Self-pay | Admitting: Family Medicine

## 2015-06-01 DIAGNOSIS — C9000 Multiple myeloma not having achieved remission: Secondary | ICD-10-CM

## 2015-06-03 ENCOUNTER — Other Ambulatory Visit (HOSPITAL_BASED_OUTPATIENT_CLINIC_OR_DEPARTMENT_OTHER): Payer: Self-pay

## 2015-06-03 ENCOUNTER — Ambulatory Visit: Payer: Self-pay | Admitting: Hematology

## 2015-06-03 ENCOUNTER — Ambulatory Visit (HOSPITAL_BASED_OUTPATIENT_CLINIC_OR_DEPARTMENT_OTHER): Payer: Self-pay

## 2015-06-03 VITALS — BP 149/59 | HR 64 | Temp 98.2°F | Resp 18

## 2015-06-03 DIAGNOSIS — E8581 Light chain (AL) amyloidosis: Secondary | ICD-10-CM

## 2015-06-03 DIAGNOSIS — E858 Other amyloidosis: Secondary | ICD-10-CM

## 2015-06-03 DIAGNOSIS — Z5112 Encounter for antineoplastic immunotherapy: Secondary | ICD-10-CM

## 2015-06-03 LAB — COMPREHENSIVE METABOLIC PANEL
ALT: 11 U/L (ref 0–55)
AST: 15 U/L (ref 5–34)
Albumin: 3.4 g/dL — ABNORMAL LOW (ref 3.5–5.0)
Alkaline Phosphatase: 71 U/L (ref 40–150)
Anion Gap: 10 mEq/L (ref 3–11)
BUN: 37.9 mg/dL — AB (ref 7.0–26.0)
CALCIUM: 8.8 mg/dL (ref 8.4–10.4)
CHLORIDE: 114 meq/L — AB (ref 98–109)
CO2: 18 meq/L — AB (ref 22–29)
CREATININE: 3.2 mg/dL — AB (ref 0.7–1.3)
EGFR: 20 mL/min/{1.73_m2} — ABNORMAL LOW (ref >90)
GLUCOSE: 130 mg/dL (ref 70–140)
Potassium: 3.6 mEq/L (ref 3.5–5.1)
SODIUM: 141 meq/L (ref 136–145)
Total Bilirubin: 0.46 mg/dL (ref 0.20–1.20)
Total Protein: 6.4 g/dL (ref 6.4–8.3)

## 2015-06-03 LAB — CBC & DIFF AND RETIC
BASO%: 0.7 % (ref 0.0–2.0)
BASOS ABS: 0 10*3/uL (ref 0.0–0.1)
EOS%: 4.5 % (ref 0.0–7.0)
Eosinophils Absolute: 0.2 10*3/uL (ref 0.0–0.5)
HEMATOCRIT: 28 % — AB (ref 38.4–49.9)
HGB: 9.4 g/dL — ABNORMAL LOW (ref 13.0–17.1)
Immature Retic Fract: 3.1 % (ref 3.00–10.60)
LYMPH%: 14.6 % (ref 14.0–49.0)
MCH: 29.9 pg (ref 27.2–33.4)
MCHC: 33.6 g/dL (ref 32.0–36.0)
MCV: 89.2 fL (ref 79.3–98.0)
MONO#: 0.5 10*3/uL (ref 0.1–0.9)
MONO%: 11.2 % (ref 0.0–14.0)
NEUT#: 3.1 10*3/uL (ref 1.5–6.5)
NEUT%: 69 % (ref 39.0–75.0)
PLATELETS: 101 10*3/uL — AB (ref 140–400)
RBC: 3.14 10*6/uL — ABNORMAL LOW (ref 4.20–5.82)
RDW: 15.1 % — AB (ref 11.0–14.6)
RETIC %: 1.47 % (ref 0.80–1.80)
Retic Ct Abs: 46.16 10*3/uL (ref 34.80–93.90)
WBC: 4.5 10*3/uL (ref 4.0–10.3)
lymph#: 0.7 10*3/uL — ABNORMAL LOW (ref 0.9–3.3)

## 2015-06-03 MED ORDER — BORTEZOMIB CHEMO SQ INJECTION 3.5 MG (2.5MG/ML)
1.3000 mg/m2 | Freq: Once | INTRAMUSCULAR | Status: AC
  Administered 2015-06-03: 2.75 mg via SUBCUTANEOUS
  Filled 2015-06-03: qty 2.75

## 2015-06-03 MED ORDER — ONDANSETRON HCL 8 MG PO TABS
8.0000 mg | ORAL_TABLET | Freq: Once | ORAL | Status: AC
  Administered 2015-06-03: 8 mg via ORAL

## 2015-06-03 MED ORDER — ONDANSETRON HCL 8 MG PO TABS
ORAL_TABLET | ORAL | Status: AC
  Filled 2015-06-03: qty 1

## 2015-06-03 NOTE — Progress Notes (Signed)
Pt okay to treat per Dr. [name] for Cr.3.2

## 2015-06-03 NOTE — Patient Instructions (Signed)
[facility] Discharge Instructions for Patients Receiving Chemotherapy  Today you received the following chemotherapy agents Velcade. To help prevent nausea and vomiting after your treatment, we encourage you to take your nausea medication as directed.  If you develop nausea and vomiting that is not controlled by your nausea medication, call the clinic.   BELOW ARE SYMPTOMS THAT SHOULD BE REPORTED IMMEDIATELY:  *FEVER GREATER THAN 100.5 F  *CHILLS WITH OR WITHOUT FEVER  NAUSEA AND VOMITING THAT IS NOT CONTROLLED WITH YOUR NAUSEA MEDICATION  *UNUSUAL SHORTNESS OF BREATH  *UNUSUAL BRUISING OR BLEEDING  TENDERNESS IN MOUTH AND THROAT WITH OR WITHOUT PRESENCE OF ULCERS  *URINARY PROBLEMS  *BOWEL PROBLEMS  UNUSUAL RASH Items with * indicate a potential emergency and should be followed up as soon as possible.  Feel free to call the clinic you have any questions or concerns. The clinic phone number is [phone].  Please show the CHEMO ALERT CARD at check-in to the Emergency Department and triage nurse.    

## 2015-06-06 ENCOUNTER — Other Ambulatory Visit: Payer: Self-pay | Admitting: Family Medicine

## 2015-06-06 LAB — KAPPA/LAMBDA LIGHT CHAINS
Ig Kappa Free Light Chain: 35.76 mg/L — ABNORMAL HIGH (ref 3.30–19.40)
Ig Lambda Free Light Chain: 25.83 mg/L (ref 5.71–26.30)
Kappa/Lambda FluidC Ratio: 1.38 (ref 0.26–1.65)

## 2015-06-07 LAB — MULTIPLE MYELOMA PANEL, SERUM
ALBUMIN SERPL ELPH-MCNC: 3.2 g/dL (ref 2.9–4.4)
ALPHA2 GLOB SERPL ELPH-MCNC: 1 g/dL (ref 0.4–1.0)
Albumin/Glob SerPl: 1.3 (ref 0.7–1.7)
Alpha 1: 0.3 g/dL (ref 0.0–0.4)
B-GLOBULIN SERPL ELPH-MCNC: 0.9 g/dL (ref 0.7–1.3)
Gamma Glob SerPl Elph-Mcnc: 0.4 g/dL (ref 0.4–1.8)
Globulin, Total: 2.6 g/dL (ref 2.2–3.9)
IGG (IMMUNOGLOBIN G), SERUM: 313 mg/dL — AB (ref 700–1600)
IGM (IMMUNOGLOBIN M), SRM: 135 mg/dL (ref 20–172)
IgA, Qn, Serum: 72 mg/dL (ref 61–437)
M PROTEIN SERPL ELPH-MCNC: 0.1 g/dL — AB
TOTAL PROTEIN: 5.8 g/dL — AB (ref 6.0–8.5)

## 2015-06-10 ENCOUNTER — Telehealth: Payer: Self-pay | Admitting: Pharmacist

## 2015-06-10 ENCOUNTER — Ambulatory Visit (HOSPITAL_COMMUNITY)
Admission: RE | Admit: 2015-06-10 | Discharge: 2015-06-10 | Disposition: A | Discharge status: Home / Self Care | Payer: Self-pay | Source: Ambulatory Visit | Attending: Hematology | Admitting: Hematology

## 2015-06-10 DIAGNOSIS — E858 Other amyloidosis: Secondary | ICD-10-CM | POA: Insufficient documentation

## 2015-06-10 DIAGNOSIS — E8581 Light chain (AL) amyloidosis: Secondary | ICD-10-CM

## 2015-06-10 DIAGNOSIS — I119 Hypertensive heart disease without heart failure: Secondary | ICD-10-CM | POA: Insufficient documentation

## 2015-06-10 DIAGNOSIS — Z87891 Personal history of nicotine dependence: Secondary | ICD-10-CM | POA: Insufficient documentation

## 2015-06-10 DIAGNOSIS — I352 Nonrheumatic aortic (valve) stenosis with insufficiency: Secondary | ICD-10-CM | POA: Insufficient documentation

## 2015-06-10 NOTE — Progress Notes (Signed)
*  PRELIMINARY RESULTS* Echocardiogram 2D Echocardiogram has been performed.  Leavy Cella 06/10/2015, 11:43 AM

## 2015-06-10 NOTE — Telephone Encounter (Signed)
I received call back from Elray Buba, [place].  She is not able to notarize the letter for the patient. I left a message for Loren Racer, social worker at [facility].  We may also have to reach out to a social worker at the hospital to come over to notarize. Kennith Center, Pharm.D., CPP 06/10/2015@3 :35 PM

## 2015-06-10 NOTE — Telephone Encounter (Signed)
I received call from [place] patient assistance program stating the ICD code we faxed in on 06/08/15 is fine.  However the letter pts daughter wrote was not sufficient.  They requested the pt write the letter and needs to include household size and state income, even if it is $0.  The letter must be notarized, signed and dated and can be faxed in to [place] at fax# [phone]. I called pt and discussed with him.  He is willing to do this and will come in Mon (4/3) to bring the letter and sign for notarization. I left messages for our on-site notary publics: Polo Riley, social worker and Elray Buba, Therapist, sports.  Hopefully one of them will be able to help with this on Monday.  Note: pt has appt w/ Dr. [name] on Fri 06/17/15 but has not started Ninlaro.  I sent in-Basket msg to Dr. [name] ?should pt keep this appt? Kennith Center, Pharm.D., CPP 06/10/2015@3 :27 PM

## 2015-06-13 ENCOUNTER — Encounter: Payer: Self-pay | Admitting: Pharmacist

## 2015-06-13 NOTE — Progress Notes (Signed)
06/13/15: patient came by to sign letter for income statement to send to [employer]uff[employer] 1 point patient assistance program. Letter signed and notarized by Annamary Rummage and faxed to [place] program at [phone]

## 2015-06-14 ENCOUNTER — Encounter: Payer: Self-pay | Admitting: Pharmacist

## 2015-06-14 NOTE — Progress Notes (Signed)
06/14/15: Jose Garrett has been approved for free ninlaro through [employer] 1-point patient support program for 3 months. This is a temporary approval and will depend on if [name] receives Medicaid approval. If he does then Medicaid will pay for the [facility] but if he is denied. [employer] 1-point program will provide continual assistance on the Ninlaro. He will receive shipment this week or early next week  Thank you  Montel Clock, PharmD, [facility]

## 2015-06-16 ENCOUNTER — Other Ambulatory Visit: Payer: Self-pay | Admitting: *Deleted

## 2015-06-16 ENCOUNTER — Ambulatory Visit: Payer: Self-pay | Admitting: Hematology

## 2015-06-16 ENCOUNTER — Other Ambulatory Visit: Payer: Self-pay

## 2015-06-17 ENCOUNTER — Other Ambulatory Visit: Payer: Self-pay

## 2015-06-17 ENCOUNTER — Ambulatory Visit: Payer: Self-pay | Admitting: Hematology

## 2015-06-17 ENCOUNTER — Other Ambulatory Visit: Payer: Self-pay | Admitting: *Deleted

## 2015-06-17 ENCOUNTER — Other Ambulatory Visit: Payer: Self-pay | Admitting: Family Medicine

## 2015-06-17 ENCOUNTER — Telehealth: Payer: Self-pay | Admitting: Hematology

## 2015-06-17 MED ORDER — DEXAMETHASONE 4 MG PO TABS: ORAL_TABLET | ORAL | Status: DC

## 2015-06-17 NOTE — Progress Notes (Signed)
Dexamethasone 20 mg taking once a week before ninlaro sent to patient pharmacy.

## 2015-06-17 NOTE — Progress Notes (Signed)
Patient called stating that he has received his ninlaro. Patient will begin D1 on 06/20/15. Patient informed that he does not have to take dexamethasone with this medication (per MD [name]). Patient verbalized understanding.

## 2015-06-17 NOTE — Telephone Encounter (Signed)
Spoke with patient to confirm fu appt 4/18 per 4/6 pof

## 2015-06-20 ENCOUNTER — Encounter: Payer: Self-pay | Admitting: Pharmacist

## 2015-06-20 NOTE — Progress Notes (Signed)
Oral Chemotherapy Pharmacist Encounter   I spoke with patient for overview of new oral chemotherapy medication: Ninlaro. Pt is doing well. Pt with no insurance. Finally approved for free Ninlaro product through [facility] oncology drug company. Patient received medication last week and plans to take medication tomorrow on Tuesday 4/11 (he took his weekly dexamethasone today already)  Counseled patient on administration, dosing, side effects, safe handling, and monitoring. Side effects include but not limited to: fatigue, diarrhea, low platelets, nausea, back pain, rash, and peripheral neuropathy.  [name] voiced understanding and appreciation. He is concerned with being able to swallow the capsules as he sometimes has difficulty with this. He will let us know if he has problems.   All questions answered.  Will follow up in 1 week for adherence and toxicity management.   Thank you,  Montel Clock, PharmD, [facility]

## 2015-06-27 ENCOUNTER — Encounter: Payer: Self-pay | Admitting: Pharmacist

## 2015-06-27 NOTE — Progress Notes (Signed)
Oral Chemotherapy Follow-Up Form  Original Start date of oral chemotherapy: _4/11/17__   Called patient today to follow up regarding patient's oral chemotherapy medication: _Ninlaro/dex_  Pt is doing well today. He tolerated first dose last week well. Reports mild nausea 1 day. Energy levels fluctuating due to steroids. He has difficulty swallowing capsules. Ninlaro is to be taken on an empty stomach. [name] states the only way he can get the capsule down is with a small amount of apple sauce. This should not be a problem as Ninlaro AUC is decreased by high fat meals and given the fact that [name] is using a minimal amount of apple sauce (whcih is a fat free food) this should be fine. Will continue to monitor.   Pt reports _0___ tablets missed in the last week.    Pt reports the following side effects: _mild nausea_____  Other Issues: __difficulty swallowing capsule___   Will follow up and call patient again in __2 weeks__   Thank you,  Montel Clock, PharmD, [facility]

## 2015-06-28 ENCOUNTER — Encounter: Payer: Self-pay | Admitting: Hematology

## 2015-06-28 ENCOUNTER — Telehealth: Payer: Self-pay | Admitting: Hematology

## 2015-06-28 ENCOUNTER — Other Ambulatory Visit (HOSPITAL_BASED_OUTPATIENT_CLINIC_OR_DEPARTMENT_OTHER): Payer: Self-pay

## 2015-06-28 ENCOUNTER — Ambulatory Visit (HOSPITAL_BASED_OUTPATIENT_CLINIC_OR_DEPARTMENT_OTHER): Payer: Self-pay | Admitting: Hematology

## 2015-06-28 ENCOUNTER — Other Ambulatory Visit: Payer: Self-pay | Admitting: *Deleted

## 2015-06-28 VITALS — BP 163/64 | HR 66 | Temp 97.6°F | Resp 18 | Ht 68.0 in | Wt 190.7 lb

## 2015-06-28 DIAGNOSIS — C9000 Multiple myeloma not having achieved remission: Secondary | ICD-10-CM

## 2015-06-28 DIAGNOSIS — I129 Hypertensive chronic kidney disease with stage 1 through stage 4 chronic kidney disease, or unspecified chronic kidney disease: Secondary | ICD-10-CM

## 2015-06-28 DIAGNOSIS — R809 Proteinuria, unspecified: Secondary | ICD-10-CM

## 2015-06-28 DIAGNOSIS — R609 Edema, unspecified: Secondary | ICD-10-CM

## 2015-06-28 DIAGNOSIS — N183 Chronic kidney disease, stage 3 (moderate): Secondary | ICD-10-CM

## 2015-06-28 DIAGNOSIS — D631 Anemia in chronic kidney disease: Secondary | ICD-10-CM

## 2015-06-28 DIAGNOSIS — E8581 Light chain (AL) amyloidosis: Secondary | ICD-10-CM

## 2015-06-28 DIAGNOSIS — D6481 Anemia due to antineoplastic chemotherapy: Secondary | ICD-10-CM

## 2015-06-28 DIAGNOSIS — E858 Other amyloidosis: Secondary | ICD-10-CM

## 2015-06-28 DIAGNOSIS — D638 Anemia in other chronic diseases classified elsewhere: Secondary | ICD-10-CM

## 2015-06-28 LAB — CBC & DIFF AND RETIC
BASO%: 0.2 % (ref 0.0–2.0)
Basophils Absolute: 0 10*3/uL (ref 0.0–0.1)
EOS%: 1.5 % (ref 0.0–7.0)
Eosinophils Absolute: 0.1 10*3/uL (ref 0.0–0.5)
HEMATOCRIT: 30.9 % — AB (ref 38.4–49.9)
HGB: 10.6 g/dL — ABNORMAL LOW (ref 13.0–17.1)
IMMATURE RETIC FRACT: 5 % (ref 3.00–10.60)
LYMPH#: 0.4 10*3/uL — AB (ref 0.9–3.3)
LYMPH%: 5.1 % — AB (ref 14.0–49.0)
MCH: 30.5 pg (ref 27.2–33.4)
MCHC: 34.3 g/dL (ref 32.0–36.0)
MCV: 88.8 fL (ref 79.3–98.0)
MONO#: 0.2 10*3/uL (ref 0.1–0.9)
MONO%: 2.4 % (ref 0.0–14.0)
NEUT#: 7.7 10*3/uL — ABNORMAL HIGH (ref 1.5–6.5)
NEUT%: 90.8 % — AB (ref 39.0–75.0)
PLATELETS: 121 10*3/uL — AB (ref 140–400)
RBC: 3.48 10*6/uL — ABNORMAL LOW (ref 4.20–5.82)
RDW: 14.8 % — ABNORMAL HIGH (ref 11.0–14.6)
RETIC CT ABS: 53.24 10*3/uL (ref 34.80–93.90)
Retic %: 1.53 % (ref 0.80–1.80)
WBC: 8.5 10*3/uL (ref 4.0–10.3)

## 2015-06-28 LAB — COMPREHENSIVE METABOLIC PANEL
ALT: 12 U/L (ref 0–55)
ANION GAP: 12 meq/L — AB (ref 3–11)
AST: 15 U/L (ref 5–34)
Albumin: 3.7 g/dL (ref 3.5–5.0)
Alkaline Phosphatase: 80 U/L (ref 40–150)
BILIRUBIN TOTAL: 0.35 mg/dL (ref 0.20–1.20)
BUN: 34.1 mg/dL — ABNORMAL HIGH (ref 7.0–26.0)
CO2: 18 mEq/L — ABNORMAL LOW (ref 22–29)
CREATININE: 3.6 mg/dL — AB (ref 0.7–1.3)
Calcium: 9.1 mg/dL (ref 8.4–10.4)
Chloride: 111 mEq/L — ABNORMAL HIGH (ref 98–109)
EGFR: 17 mL/min/{1.73_m2} — AB (ref >90)
Glucose: 135 mg/dl (ref 70–140)
Potassium: 3.7 mEq/L (ref 3.5–5.1)
Sodium: 141 mEq/L (ref 136–145)
TOTAL PROTEIN: 7.2 g/dL (ref 6.4–8.3)

## 2015-06-28 LAB — TECHNOLOGIST REVIEW

## 2015-06-28 MED ORDER — GABAPENTIN 250 MG/5ML PO SOLN: 300.0000 mg | Freq: Every day | ORAL | Status: DC

## 2015-06-28 MED ORDER — OXYCODONE HCL 5 MG PO TABS: 5.0000 mg | ORAL_TABLET | ORAL | Status: DC | PRN

## 2015-06-28 NOTE — Telephone Encounter (Signed)
Gave and printed appt sched and avs fo rpt for May °

## 2015-06-29 LAB — KAPPA/LAMBDA LIGHT CHAINS
IG LAMBDA FREE LIGHT CHAIN: 24.69 mg/L (ref 5.71–26.30)
Ig Kappa Free Light Chain: 29.03 mg/L — ABNORMAL HIGH (ref 3.30–19.40)
KAPPA/LAMBDA FLC RATIO: 1.18 (ref 0.26–1.65)

## 2015-06-29 LAB — MULTIPLE MYELOMA PANEL, SERUM
ALBUMIN SERPL ELPH-MCNC: 3.6 g/dL (ref 2.9–4.4)
ALPHA 1: 0.3 g/dL (ref 0.0–0.4)
Albumin/Glob SerPl: 1.3 (ref 0.7–1.7)
Alpha2 Glob SerPl Elph-Mcnc: 1.2 g/dL — ABNORMAL HIGH (ref 0.4–1.0)
B-GLOBULIN SERPL ELPH-MCNC: 1 g/dL (ref 0.7–1.3)
GAMMA GLOB SERPL ELPH-MCNC: 0.4 g/dL (ref 0.4–1.8)
GLOBULIN, TOTAL: 2.8 g/dL (ref 2.2–3.9)
IgA, Qn, Serum: 74 mg/dL (ref 61–437)
IgG, Qn, Serum: 334 mg/dL — ABNORMAL LOW (ref 700–1600)
IgM, Qn, Serum: 117 mg/dL (ref 20–172)
M PROTEIN SERPL ELPH-MCNC: 0.1 g/dL — AB
Total Protein: 6.4 g/dL (ref 6.0–8.5)

## 2015-06-30 ENCOUNTER — Encounter: Payer: Self-pay | Admitting: Family Medicine

## 2015-06-30 ENCOUNTER — Ambulatory Visit: Payer: Self-pay | Attending: Family Medicine | Admitting: Family Medicine

## 2015-06-30 VITALS — BP 160/60 | HR 64 | Temp 98.5°F | Resp 16 | Ht 70.0 in | Wt 193.0 lb

## 2015-06-30 DIAGNOSIS — R609 Edema, unspecified: Secondary | ICD-10-CM | POA: Insufficient documentation

## 2015-06-30 DIAGNOSIS — N183 Chronic kidney disease, stage 3 unspecified: Secondary | ICD-10-CM

## 2015-06-30 DIAGNOSIS — Z7982 Long term (current) use of aspirin: Secondary | ICD-10-CM | POA: Insufficient documentation

## 2015-06-30 DIAGNOSIS — I10 Essential (primary) hypertension: Secondary | ICD-10-CM

## 2015-06-30 DIAGNOSIS — Z79899 Other long term (current) drug therapy: Secondary | ICD-10-CM | POA: Insufficient documentation

## 2015-06-30 DIAGNOSIS — Z87891 Personal history of nicotine dependence: Secondary | ICD-10-CM | POA: Insufficient documentation

## 2015-06-30 DIAGNOSIS — J302 Other seasonal allergic rhinitis: Secondary | ICD-10-CM | POA: Insufficient documentation

## 2015-06-30 MED ORDER — HYDRALAZINE HCL 100 MG PO TABS: 100.0000 mg | ORAL_TABLET | Freq: Three times a day (TID) | ORAL | Status: DC

## 2015-06-30 MED ORDER — CETIRIZINE HCL 5 MG PO TABS: 5.0000 mg | ORAL_TABLET | Freq: Every day | ORAL | Status: DC

## 2015-06-30 MED ORDER — METOPROLOL TARTRATE 100 MG PO TABS: 100.0000 mg | ORAL_TABLET | Freq: Two times a day (BID) | ORAL | Status: DC

## 2015-06-30 MED ORDER — FUROSEMIDE 40 MG PO TABS
40.0000 mg | ORAL_TABLET | Freq: Three times a day (TID) | ORAL | Status: DC
Start: 2015-06-30 — End: 2015-12-08

## 2015-06-30 MED ORDER — AMLODIPINE BESYLATE 10 MG PO TABS: 10.0000 mg | ORAL_TABLET | Freq: Every day | ORAL | Status: DC

## 2015-06-30 NOTE — Assessment & Plan Note (Addendum)
A: HTN, BP elevated, taking sudafed  Med: compliant P: Increase hydralazine to 100 mg TID Keep all other doses the same  Stop sudafed

## 2015-06-30 NOTE — Assessment & Plan Note (Signed)
Seasonal allergies Stop sudafed Add zyrtec 5 mg daily

## 2015-06-30 NOTE — Progress Notes (Signed)
Subjective:  Patient ID: Jose Garrett, male    DOB: 10-12-1950  Age: 65 y.o. MRN: [id number]  CC: Hypertension   HPI Jose Garrett has amyloidosis, CKD, HTN presents for   1. CHRONIC HYPERTENSION  Disease Monitoring  Blood pressure range: not checking   Chest pain: no   Dyspnea: no   Claudication: no   Medication compliance: yes, taking lasix 40 mg TID  Medication Side Effects  Lightheadedness: no   Urinary frequency: no   Edema: yes, R >L leg    Impotence:    Preventitive Healthcare:  Exercise: yes, some walking     2. Allergies: taking sudafed for congestion, runny and itchy eyes. No CP or SOB. Takes decadron once weekly that helps with symptoms for a short period.   Social History  Substance Use Topics  . Smoking status: Former Smoker -- 2.00 packs/day for 40 years    Types: Cigarettes    Quit date: 03/19/2011  . Smokeless tobacco: Never Used  . Alcohol Use: No    Outpatient Prescriptions Prior to Visit  Medication Sig Dispense Refill  . acyclovir (ZOVIRAX) 400 MG tablet Take 400 mg by mouth daily.    [name] acyclovir (ZOVIRAX) 400 MG tablet TAKE 1 TABLET BY MOUTH DAILY. (DOSE ADJUSTED FOR RENAL FUNCTION) 60 tablet 3  . amLODipine (NORVASC) 10 MG tablet Take 1 tablet (10 mg total) by mouth daily. Needs office visit for refills 30 tablet 0  . aspirin EC 81 MG tablet Take 1 tablet (81 mg total) by mouth daily. 30 tablet 11  . Cyanocobalamin (B-12 COMPLIANCE INJECTION IJ) Inject 1 mL as directed every 30 (thirty) days.    [name] dexamethasone (DECADRON) 4 MG tablet Take 5 tablets (20 mg total) by mouth once every week. 15 tablet 1  . FLONASE 50 MCG/ACT nasal spray Place 1 spray into both nostrils daily. 15.8 g 0  . furosemide (LASIX) 40 MG tablet 80 mg Monday, Tuesday, Wednesay (Patient taking differently: Take 80 mg by mouth 2 (two) times daily. ) 90 tablet 1  . gabapentin (NEURONTIN) 250 MG/5ML solution Take 6 mLs (300 mg total) by mouth at bedtime. May increased to  400mg  po HS if still having neuropathic pain 240 mL 1  . hydrALAZINE (APRESOLINE) 50 MG tablet Take 1 tablet (50 mg total) by mouth 3 (three) times daily. Needs office visit for refills 90 tablet 0  . ixazomib citrate (NINLARO) 3 MG capsule Take 1 capsule (3 mg total) by mouth once a week. Take on an empty stomach 1hr before or 2hrs after food. Do not crush, chew, or open. 4 capsule 1  . LORazepam (ATIVAN) 1 MG tablet Take 1 tablet (1 mg total) by mouth every 8 (eight) hours as needed for anxiety (or nausea). 30 tablet 0  . metoprolol (LOPRESSOR) 100 MG tablet Take 1 tablet (100 mg total) by mouth 2 (two) times daily. Needs office visit for refills 60 tablet 0  . ondansetron (ZOFRAN) 8 MG tablet Take 8 mg by mouth every 8 (eight) hours as needed for nausea or vomiting.    [name] oxyCODONE (OXY IR/ROXICODONE) 5 MG immediate release tablet Take 1 tablet (5 mg total) by mouth every 4 (four) hours as needed for severe pain. 60 tablet 0  . vitamin E 100 UNIT capsule Take 100 Units by mouth every other day.      No facility-administered medications prior to visit.    ROS Review of Systems  Constitutional: Negative for fever, chills, fatigue  and unexpected weight change.  Eyes: Negative for visual disturbance.  Respiratory: Negative for cough and shortness of breath.   Cardiovascular: Positive for leg swelling. Negative for chest pain and palpitations.  Gastrointestinal: Negative for nausea, vomiting, abdominal pain, diarrhea, constipation and blood in stool.  Endocrine: Negative for polydipsia, polyphagia and polyuria.  Musculoskeletal: Negative for myalgias, back pain, arthralgias, gait problem and neck pain.  Skin: Negative for rash.  Allergic/Immunologic: Negative for immunocompromised state.  Hematological: Negative for adenopathy. Does not bruise/bleed easily.  Psychiatric/Behavioral: Negative for suicidal ideas, sleep disturbance and dysphoric mood. The patient is not nervous/anxious.      Objective:  BP 160/60 mmHg  Pulse 64  Temp(Src) 98.5 F (36.9 C) (Oral)  Resp 16  Ht 5\' 10"  (1.778 m)  Wt 193 lb (87.544 kg)  BMI 27.69 kg/m2  SpO2 99%  BP/Weight 06/30/2015 06/28/2015 123456  Systolic BP 0000000 [id number]  Diastolic BP 60 64 59  Wt. (Lbs) 193 190.7 -  BMI 27.69 29 -   Physical Exam  Constitutional: He appears well-developed and well-nourished. No distress.  HENT:  Head: Normocephalic and atraumatic.  Neck: Normal range of motion. Neck supple.  Cardiovascular: Normal rate, regular rhythm, normal heart sounds and intact distal pulses.   Pulmonary/Chest: Effort normal and breath sounds normal.  Musculoskeletal: He exhibits edema (1+ RLE, trace in L leg ).  Neurological: He is alert.  Skin: Skin is warm and dry. No rash noted. No erythema.  Psychiatric: He has a normal mood and affect.     Assessment & Plan:   There are no diagnoses linked to this encounter. Jose Garrett was seen today for hypertension.  Diagnoses and all orders for this visit:  Essential hypertension, benign -     hydrALAZINE (APRESOLINE) 100 MG tablet; Take 1 tablet (100 mg total) by mouth 3 (three) times daily. -     metoprolol (LOPRESSOR) 100 MG tablet; Take 1 tablet (100 mg total) by mouth 2 (two) times daily. -     amLODipine (NORVASC) 10 MG tablet; Take 1 tablet (10 mg total) by mouth daily. -     furosemide (LASIX) 40 MG tablet; Take 1 tablet (40 mg total) by mouth 3 (three) times daily.  Chronic renal disease, stage III -     furosemide (LASIX) 40 MG tablet; Take 1 tablet (40 mg total) by mouth 3 (three) times daily.  Dependent edema -     furosemide (LASIX) 40 MG tablet; Take 1 tablet (40 mg total) by mouth 3 (three) times daily.  Seasonal allergies -     cetirizine (ZYRTEC) 5 MG tablet; Take 1 tablet (5 mg total) by mouth daily.   Meds ordered this encounter  Medications  . hydrALAZINE (APRESOLINE) 100 MG tablet    Sig: Take 1 tablet (100 mg total) by mouth 3 (three) times  daily.    Dispense:  90 tablet    Refill:  5  . metoprolol (LOPRESSOR) 100 MG tablet    Sig: Take 1 tablet (100 mg total) by mouth 2 (two) times daily.    Dispense:  60 tablet    Refill:  5  . amLODipine (NORVASC) 10 MG tablet    Sig: Take 1 tablet (10 mg total) by mouth daily.    Dispense:  30 tablet    Refill:  5  . furosemide (LASIX) 40 MG tablet    Sig: Take 1 tablet (40 mg total) by mouth 3 (three) times daily.    Dispense:  90 tablet  Refill:  5  . cetirizine (ZYRTEC) 5 MG tablet    Sig: Take 1 tablet (5 mg total) by mouth daily.    Dispense:  30 tablet    Refill:  5    Follow-up: No Follow-up on file.   [name] MD

## 2015-06-30 NOTE — Progress Notes (Signed)
F/U HTN  Taking medication as prescribed  No pain today today  Former smoker  No Suicidal thoughts in the past two weeks

## 2015-06-30 NOTE — Patient Instructions (Addendum)
Jullian was seen today for hypertension.  Diagnoses and all orders for this visit:  Essential hypertension, benign -     hydrALAZINE (APRESOLINE) 100 MG tablet; Take 1 tablet (100 mg total) by mouth 3 (three) times daily. -     metoprolol (LOPRESSOR) 100 MG tablet; Take 1 tablet (100 mg total) by mouth 2 (two) times daily. -     amLODipine (NORVASC) 10 MG tablet; Take 1 tablet (10 mg total) by mouth daily. -     furosemide (LASIX) 40 MG tablet; Take 1 tablet (40 mg total) by mouth 3 (three) times daily.  Chronic renal disease, stage III -     furosemide (LASIX) 40 MG tablet; Take 1 tablet (40 mg total) by mouth 3 (three) times daily.  Dependent edema -     furosemide (LASIX) 40 MG tablet; Take 1 tablet (40 mg total) by mouth 3 (three) times daily.  Seasonal allergies -     cetirizine (ZYRTEC) 5 MG tablet; Take 1 tablet (5 mg total) by mouth daily.   Fu with me in 4 weeks for BP check, since changing hydralazine to 100 mg TID  Avoid NSAIDs ( aleve, ibuprofen). Avoid OTC decongestants like sudafed. All of these medications can raise BP.   Dr. Adrian Blackwater

## 2015-07-01 ENCOUNTER — Encounter: Payer: Self-pay | Admitting: Clinical

## 2015-07-01 NOTE — Progress Notes (Signed)
Depression screen [facility] 2/9 06/30/2015 10/04/2014  Decreased Interest 1 0  Down, Depressed, Hopeless 1 0  PHQ - 2 Score 2 0  Altered sleeping 1 -  Tired, decreased energy 1 -  Change in appetite 0 -  Feeling bad or failure about yourself  1 -  Trouble concentrating 0 -  Moving slowly or fidgety/restless 0 -  Suicidal thoughts 0 -  PHQ-9 Score 5 -    GAD 7 : Generalized Anxiety Score 06/30/2015  Nervous, Anxious, on Edge 0  Control/stop worrying 0  Worry too much - different things 0  Trouble relaxing 0  Restless 0  Easily annoyed or irritable 1  Afraid - awful might happen 1  Total GAD 7 Score 2

## 2015-07-05 ENCOUNTER — Encounter: Payer: Self-pay | Admitting: Pharmacist

## 2015-07-05 NOTE — Progress Notes (Signed)
I received call from [place] patient assistance program regarding Mr Jose Garrett.  I verified that pt is still taking Ninlaro per Dr [name] 06/28/15 note and there has not been a change in Mr Jose Garrett financial situation.  [employer] to send Mr Jose Garrett his refill of Ninlaro.

## 2015-07-07 ENCOUNTER — Telehealth: Payer: Self-pay | Admitting: Family Medicine

## 2015-07-07 NOTE — Telephone Encounter (Signed)
Please, contact  Patient he has some questions to ask thank you

## 2015-07-08 ENCOUNTER — Ambulatory Visit: Payer: Self-pay | Attending: Family Medicine | Admitting: Family Medicine

## 2015-07-08 ENCOUNTER — Encounter: Payer: Self-pay | Admitting: Family Medicine

## 2015-07-08 VITALS — BP 155/63 | HR 70 | Temp 99.0°F | Resp 16 | Ht 69.0 in | Wt 191.0 lb

## 2015-07-08 DIAGNOSIS — Z7982 Long term (current) use of aspirin: Secondary | ICD-10-CM | POA: Insufficient documentation

## 2015-07-08 DIAGNOSIS — H6120 Impacted cerumen, unspecified ear: Secondary | ICD-10-CM | POA: Insufficient documentation

## 2015-07-08 DIAGNOSIS — H6122 Impacted cerumen, left ear: Secondary | ICD-10-CM | POA: Insufficient documentation

## 2015-07-08 DIAGNOSIS — J029 Acute pharyngitis, unspecified: Secondary | ICD-10-CM | POA: Insufficient documentation

## 2015-07-08 DIAGNOSIS — Z87891 Personal history of nicotine dependence: Secondary | ICD-10-CM | POA: Insufficient documentation

## 2015-07-08 DIAGNOSIS — Z79899 Other long term (current) drug therapy: Secondary | ICD-10-CM | POA: Insufficient documentation

## 2015-07-08 MED ORDER — AMOXICILLIN 400 MG/5ML PO SUSR: 500.0000 mg | Freq: Three times a day (TID) | ORAL | Status: DC

## 2015-07-08 MED ORDER — CARBAMIDE PEROXIDE 6.5 % OT SOLN: 5.0000 [drp] | Freq: Two times a day (BID) | OTIC | Status: DC

## 2015-07-08 NOTE — Assessment & Plan Note (Signed)
Acute pharyngitis amox susp  Tylenol for pain

## 2015-07-08 NOTE — Assessment & Plan Note (Signed)
Cerumen L ear Debrox

## 2015-07-08 NOTE — Progress Notes (Signed)
Ear problems x 3 days ago  Pain scale # 6 No tobacco user  No suicidal thoughts in the past weeks

## 2015-07-08 NOTE — Progress Notes (Signed)
Subjective:  Patient ID: Jose Garrett, male    DOB: 02/15/1951  Age: 65 y.o. MRN: [id number]  CC: Otalgia   HPI KINSEY RUPANI presents for   1. Sore throat: x 3 days. With cough and R ear pain. No fever or chills, CP, SOB.  Social History  Substance Use Topics  . Smoking status: Former Smoker -- 2.00 packs/day for 40 years    Types: Cigarettes    Quit date: 03/19/2011  . Smokeless tobacco: Never Used  . Alcohol Use: No    Outpatient Prescriptions Prior to Visit  Medication Sig Dispense Refill  . acyclovir (ZOVIRAX) 400 MG tablet TAKE 1 TABLET BY MOUTH DAILY. (DOSE ADJUSTED FOR RENAL FUNCTION) 60 tablet 3  . amLODipine (NORVASC) 10 MG tablet Take 1 tablet (10 mg total) by mouth daily. 30 tablet 5  . aspirin EC 81 MG tablet Take 1 tablet (81 mg total) by mouth daily. 30 tablet 11  . cetirizine (ZYRTEC) 5 MG tablet Take 1 tablet (5 mg total) by mouth daily. 30 tablet 5  . Cyanocobalamin (B-12 COMPLIANCE INJECTION IJ) Inject 1 mL as directed every 30 (thirty) days. Reported on 07/08/2015    . dexamethasone (DECADRON) 4 MG tablet Take 5 tablets (20 mg total) by mouth once every week. 15 tablet 1  . FLONASE 50 MCG/ACT nasal spray Place 1 spray into both nostrils daily. 15.8 g 0  . furosemide (LASIX) 40 MG tablet Take 1 tablet (40 mg total) by mouth 3 (three) times daily. 90 tablet 5  . gabapentin (NEURONTIN) 250 MG/5ML solution Take 6 mLs (300 mg total) by mouth at bedtime. May increased to 400mg  po HS if still having neuropathic pain 240 mL 1  . hydrALAZINE (APRESOLINE) 100 MG tablet Take 1 tablet (100 mg total) by mouth 3 (three) times daily. 90 tablet 5  . ixazomib citrate (NINLARO) 3 MG capsule Take 1 capsule (3 mg total) by mouth once a week. Take on an empty stomach 1hr before or 2hrs after food. Do not crush, chew, or open. 4 capsule 1  . LORazepam (ATIVAN) 1 MG tablet Take 1 tablet (1 mg total) by mouth every 8 (eight) hours as needed for anxiety (or nausea). 30 tablet 0  .  metoprolol (LOPRESSOR) 100 MG tablet Take 1 tablet (100 mg total) by mouth 2 (two) times daily. 60 tablet 5  . ondansetron (ZOFRAN) 8 MG tablet Take 8 mg by mouth every 8 (eight) hours as needed for nausea or vomiting. Reported on 07/08/2015    . oxyCODONE (OXY IR/ROXICODONE) 5 MG immediate release tablet Take 1 tablet (5 mg total) by mouth every 4 (four) hours as needed for severe pain. 60 tablet 0  . vitamin E 100 UNIT capsule Take 100 Units by mouth every other day. Reported on 07/08/2015     No facility-administered medications prior to visit.    ROS Review of Systems  Constitutional: Negative for fever, chills, fatigue and unexpected weight change.  HENT: Positive for dental problem, ear pain and sore throat.   Eyes: Negative for visual disturbance.  Respiratory: Positive for cough. Negative for shortness of breath.   Cardiovascular: Negative for chest pain, palpitations and leg swelling.  Gastrointestinal: Negative for nausea, vomiting, abdominal pain, diarrhea, constipation and blood in stool.  Endocrine: Negative for polydipsia, polyphagia and polyuria.  Musculoskeletal: Negative for myalgias, back pain, arthralgias, gait problem and neck pain.  Skin: Negative for rash.  Allergic/Immunologic: Negative for immunocompromised state.  Hematological: Negative for adenopathy.  Does not bruise/bleed easily.  Psychiatric/Behavioral: Negative for suicidal ideas, sleep disturbance and dysphoric mood. The patient is not nervous/anxious.     Objective:  BP 155/63 mmHg  Pulse 70  Temp(Src) 99 F (37.2 C) (Oral)  Resp 16  Ht 5\' 9"  (1.753 m)  Wt 191 lb (86.637 kg)  BMI 28.19 kg/m2  SpO2 99%  BP/Weight 07/08/2015 06/30/2015 [id number]  Systolic BP [id number]  Diastolic BP 63 60 64  Wt. (Lbs) 191 193 190.7  BMI 28.19 27.69 29   Physical Exam  Constitutional: He appears well-developed and well-nourished. No distress.  HENT:  Head: Normocephalic and atraumatic.  Right Ear: Tympanic  membrane and ear canal normal.  Nose: Nose normal.  Mouth/Throat: Oropharynx is clear and moist and mucous membranes are normal. He does not have dentures. No oral lesions. No trismus in the jaw. Abnormal dentition. Dental caries present. No dental abscesses, uvula swelling or lacerations.  L ear impacted with cerumen   Neck: Normal range of motion. Neck supple.  Cardiovascular: Normal rate, regular rhythm, normal heart sounds and intact distal pulses.   Pulmonary/Chest: Effort normal and breath sounds normal.  Musculoskeletal: He exhibits no edema.  Neurological: He is alert.  Skin: Skin is warm and dry. No rash noted. No erythema.  Psychiatric: He has a normal mood and affect.     Assessment & Plan:   There are no diagnoses linked to this encounter. Vontae was seen today for otalgia.  Diagnoses and all orders for this visit:  Acute pharyngitis, unspecified etiology -     amoxicillin (AMOXIL) 400 MG/5ML suspension; Take 6.3 mLs (500 mg total) by mouth 3 (three) times daily.  Cerumen impaction, left -     carbamide peroxide (DEBROX) 6.5 % otic solution; Place 5 drops into the left ear 2 (two) times daily.   Meds ordered this encounter  Medications  . amoxicillin (AMOXIL) 400 MG/5ML suspension    Sig: Take 6.3 mLs (500 mg total) by mouth 3 (three) times daily.    Dispense:  200 mL    Refill:  0  . carbamide peroxide (DEBROX) 6.5 % otic solution    Sig: Place 5 drops into the left ear 2 (two) times daily.    Dispense:  15 mL    Refill:  0    Follow-up: No Follow-up on file.   [name] MD

## 2015-07-08 NOTE — Patient Instructions (Addendum)
Jose Garrett was seen today for otalgia.  Diagnoses and all orders for this visit:  Acute pharyngitis, unspecified etiology -     amoxicillin (AMOXIL) 400 MG/5ML suspension; Take 6.3 mLs (500 mg total) by mouth 3 (three) times daily.  Cerumen impaction, left -     carbamide peroxide (DEBROX) 6.5 % otic solution; Place 5 drops into the left ear 2 (two) times daily.    F/u in 3-4 weeks for HTN   Dr. Adrian Blackwater

## 2015-07-11 ENCOUNTER — Encounter: Payer: Self-pay | Admitting: Pharmacist

## 2015-07-11 NOTE — Progress Notes (Signed)
Oral Chemotherapy Follow-Up Form  Original Start date of oral chemotherapy: _4/11/17__   Called patient today to follow up regarding patient's oral chemotherapy medication: _Ninlaro/dex_  Jose Garrett tolerated first cycle well. He did start on amoxacillin suspension last Friday for pharyngitis. This is his off week on Ninlaro so he is looking forward to the break. Cycle 2 will start on 5/8. No missed doses. Still doing fine swallowing the capsule  Pt reports _0___ tablets missed in the last 2  weeks.    Pt reports the following side effects: _none to report___  Other Issues: __difficulty swallowing capsule/pharyngitis___   Will follow up and call patient again in __3 weeks__   Thank you,  Montel Clock, PharmD, [facility]

## 2015-07-19 ENCOUNTER — Encounter: Payer: Self-pay | Admitting: Hematology

## 2015-07-19 ENCOUNTER — Telehealth: Payer: Self-pay | Admitting: Hematology

## 2015-07-19 ENCOUNTER — Ambulatory Visit (HOSPITAL_BASED_OUTPATIENT_CLINIC_OR_DEPARTMENT_OTHER): Payer: Self-pay | Admitting: Hematology

## 2015-07-19 ENCOUNTER — Other Ambulatory Visit (HOSPITAL_BASED_OUTPATIENT_CLINIC_OR_DEPARTMENT_OTHER): Payer: Self-pay

## 2015-07-19 VITALS — BP 147/56 | HR 68 | Temp 98.2°F | Resp 18 | Ht 69.0 in | Wt 189.5 lb

## 2015-07-19 DIAGNOSIS — E8581 Light chain (AL) amyloidosis: Secondary | ICD-10-CM

## 2015-07-19 DIAGNOSIS — N183 Chronic kidney disease, stage 3 unspecified: Secondary | ICD-10-CM

## 2015-07-19 DIAGNOSIS — D631 Anemia in chronic kidney disease: Secondary | ICD-10-CM

## 2015-07-19 DIAGNOSIS — R609 Edema, unspecified: Secondary | ICD-10-CM

## 2015-07-19 DIAGNOSIS — I129 Hypertensive chronic kidney disease with stage 1 through stage 4 chronic kidney disease, or unspecified chronic kidney disease: Secondary | ICD-10-CM

## 2015-07-19 DIAGNOSIS — D6481 Anemia due to antineoplastic chemotherapy: Secondary | ICD-10-CM

## 2015-07-19 DIAGNOSIS — E858 Other amyloidosis: Secondary | ICD-10-CM

## 2015-07-19 DIAGNOSIS — R809 Proteinuria, unspecified: Secondary | ICD-10-CM

## 2015-07-19 DIAGNOSIS — E538 Deficiency of other specified B group vitamins: Secondary | ICD-10-CM | POA: Insufficient documentation

## 2015-07-19 LAB — CBC & DIFF AND RETIC
BASO%: 0.4 % (ref 0.0–2.0)
Basophils Absolute: 0 10*3/uL (ref 0.0–0.1)
EOS ABS: 0.2 10*3/uL (ref 0.0–0.5)
EOS%: 3.5 % (ref 0.0–7.0)
HCT: 29 % — ABNORMAL LOW (ref 38.4–49.9)
HEMOGLOBIN: 9.7 g/dL — AB (ref 13.0–17.1)
IMMATURE RETIC FRACT: 3.1 % (ref 3.00–10.60)
LYMPH%: 15.8 % (ref 14.0–49.0)
MCH: 29.7 pg (ref 27.2–33.4)
MCHC: 33.4 g/dL (ref 32.0–36.0)
MCV: 88.7 fL (ref 79.3–98.0)
MONO#: 0.4 10*3/uL (ref 0.1–0.9)
MONO%: 7.6 % (ref 0.0–14.0)
NEUT#: 4.1 10*3/uL (ref 1.5–6.5)
NEUT%: 72.7 % (ref 39.0–75.0)
PLATELETS: 133 10*3/uL — AB (ref 140–400)
RBC: 3.27 10*6/uL — ABNORMAL LOW (ref 4.20–5.82)
RDW: 14.3 % (ref 11.0–14.6)
Retic %: 1.23 % (ref 0.80–1.80)
Retic Ct Abs: 40.22 10*3/uL (ref 34.80–93.90)
WBC: 5.6 10*3/uL (ref 4.0–10.3)
lymph#: 0.9 10*3/uL (ref 0.9–3.3)

## 2015-07-19 LAB — COMPREHENSIVE METABOLIC PANEL
ALK PHOS: 68 U/L (ref 40–150)
ALT: 15 U/L (ref 0–55)
ANION GAP: 13 meq/L — AB (ref 3–11)
AST: 15 U/L (ref 5–34)
Albumin: 3.4 g/dL — ABNORMAL LOW (ref 3.5–5.0)
BILIRUBIN TOTAL: 0.33 mg/dL (ref 0.20–1.20)
BUN: 38.1 mg/dL — ABNORMAL HIGH (ref 7.0–26.0)
CALCIUM: 9 mg/dL (ref 8.4–10.4)
CO2: 18 meq/L — AB (ref 22–29)
CREATININE: 3.8 mg/dL — AB (ref 0.7–1.3)
Chloride: 110 mEq/L — ABNORMAL HIGH (ref 98–109)
EGFR: 16 mL/min/{1.73_m2} — AB (ref >90)
Glucose: 126 mg/dl (ref 70–140)
Potassium: 3.5 mEq/L (ref 3.5–5.1)
Sodium: 141 mEq/L (ref 136–145)
TOTAL PROTEIN: 6.6 g/dL (ref 6.4–8.3)

## 2015-07-19 LAB — TECHNOLOGIST REVIEW: Technologist Review: 1

## 2015-07-19 MED ORDER — B-12 1000 MCG SL SUBL: 1000.0000 ug | SUBLINGUAL_TABLET | Freq: Every day | SUBLINGUAL | Status: DC

## 2015-07-19 NOTE — Telephone Encounter (Signed)
Gave and printned appt sched and avs for pt for June

## 2015-07-20 LAB — KAPPA/LAMBDA LIGHT CHAINS
IG LAMBDA FREE LIGHT CHAIN: 25.52 mg/L (ref 5.71–26.30)
Ig Kappa Free Light Chain: 40.21 mg/L — ABNORMAL HIGH (ref 3.30–19.40)
KAPPA/LAMBDA FLC RATIO: 1.58 (ref 0.26–1.65)

## 2015-07-25 LAB — MULTIPLE MYELOMA PANEL, SERUM
ALPHA2 GLOB SERPL ELPH-MCNC: 1.1 g/dL — AB (ref 0.4–1.0)
Albumin SerPl Elph-Mcnc: 3.3 g/dL (ref 2.9–4.4)
Albumin/Glob SerPl: 1.3 (ref 0.7–1.7)
Alpha 1: 0.3 g/dL (ref 0.0–0.4)
B-GLOBULIN SERPL ELPH-MCNC: 1 g/dL (ref 0.7–1.3)
Gamma Glob SerPl Elph-Mcnc: 0.3 g/dL — ABNORMAL LOW (ref 0.4–1.8)
Globulin, Total: 2.6 g/dL (ref 2.2–3.9)
IGG (IMMUNOGLOBIN G), SERUM: 281 mg/dL — AB (ref 700–1600)
IgA, Qn, Serum: 65 mg/dL (ref 61–437)
IgM, Qn, Serum: 106 mg/dL (ref 20–172)
M PROTEIN SERPL ELPH-MCNC: 0.1 g/dL — AB
TOTAL PROTEIN: 5.9 g/dL — AB (ref 6.0–8.5)

## 2015-07-28 NOTE — Progress Notes (Signed)
.    Hematology oncology clinic note  Date of service  06/28/2015  Patient Care Team: [name], MD as PCP - General (Family Medicine) [name], MD as PCP - Hematology/Oncology (Hematology and Oncology) [name], MD as Consulting Physician (Nephrology)  CHIEF COMPLAINTS: followup for AL amyloidosis.  HISTORY OF PRESENTING ILLNESS: Please see my initial consultation for details on presentation.  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 AL amyloidosis noted on kidney biopsy.  Had about 15 g of protein per 24 hours which produced a less than 50% suggesting renal response to treatment. - no overt heart failure   Current treatment Ixazomib + Dexamethasone.   previous treatment  CyBorD x 6 cycles Evaluated for and declined Auto HSCT at [facility] hospital. - maintenance Velcade  INTERVAL HISTORY  [name] is here for his followup  For toxicity check for his newly started Ixazomib.  He notes that there was minimal nausea that resolved with medications. No other acute new symptoms. No significant GI upset or diarrhea.  Does not report any change in his baseline neuropathy.  Energy levels have been okay.  Tolerating dexamethasone okay. Minimal lower extremity edema.  MEDICAL HISTORY:  Past Medical History  Diagnosis Date  . Hypertension Dx 2012  . Hyperlipidemia Dx 2012  . History of blood transfusion 09/08/2014    "got hematoma after renal biopsy & HgB dropped"  . Anemia Dx 2016  . Family history of adverse reaction to anesthesia     "my daughter can't take certain anesthesia agents" (09/08/2014)  . Enlarged heart 2015  . Chronic diastolic CHF (congestive heart failure), NYHA class 1 ([place])     /hospital problem list 09/08/2014  . Renal disorder     "there is a spot on my kidney" (09/08/2014)  . Chronic renal disease, stage III     /hospital problem list 09/08/2014    SURGICAL HISTORY: Past  Surgical History  Procedure Laterality Date  . Renal biopsy, percutaneous Right 09/08/2014  . Tonsillectomy  ~ 1960  . Laparoscopic cholecystectomy  03/2011    SOCIAL HISTORY: Social History   Social History  . Marital Status: Divorced    Spouse Name: N/A  . Number of Children: N/A  . Years of Education: N/A   Occupational History  . Not on file.   Social History Main Topics  . Smoking status: Former Smoker -- 2.00 packs/day for 40 years    Types: Cigarettes    Quit date: 03/19/2011  . Smokeless tobacco: Never Used  . Alcohol Use: No  . Drug Use: No     Comment: 09/08/2014 "I have used marijuana till the 1990's". Notes that he quit 15 yrs ago  . Sexual Activity: Not Currently   Other Topics Concern  . 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 Lives with his daughter and her finance.  FAMILY HISTORY: Family History  Problem Relation Age of Onset  . Hypertension Mother   . Diabetes Mother   . Heart disease Mother   . Skin cancer Mother   . Hypertension Father   . Diabetes Father   . Diabetes Sister     ALLERGIES:  has No Known Allergies.  MEDICATIONS:  Current Outpatient Prescriptions  Medication Sig Dispense Refill  . acyclovir (ZOVIRAX) 400 MG tablet TAKE 1 TABLET BY MOUTH DAILY. (DOSE ADJUSTED FOR RENAL FUNCTION) 60 tablet 3  . amLODipine (NORVASC)  10 MG tablet Take 1 tablet (10 mg total) by mouth daily. 30 tablet 5  . amoxicillin (AMOXIL) 400 MG/5ML suspension Take 6.3 mLs (500 mg total) by mouth 3 (three) times daily. 200 mL 0  . aspirin EC 81 MG tablet Take 1 tablet (81 mg total) by mouth daily. 30 tablet 11  . carbamide peroxide (DEBROX) 6.5 % otic solution Place 5 drops into the left ear 2 (two) times daily. 15 mL 0  . cetirizine (ZYRTEC) 5 MG tablet Take 1 tablet (5 mg total) by mouth daily. 30 tablet 5  . Cyanocobalamin (B-12) 1000 MCG SUBL Place 1,000 mcg under the tongue daily. 30  each 3  . dexamethasone (DECADRON) 4 MG tablet Take 5 tablets (20 mg total) by mouth once every week. 15 tablet 1  . FLONASE 50 MCG/ACT nasal spray Place 1 spray into both nostrils daily. 15.8 g 0  . furosemide (LASIX) 40 MG tablet Take 1 tablet (40 mg total) by mouth 3 (three) times daily. 90 tablet 5  . gabapentin (NEURONTIN) 250 MG/5ML solution Take 6 mLs (300 mg total) by mouth at bedtime. May increased to '400mg'$  po HS if still having neuropathic pain 240 mL 1  . hydrALAZINE (APRESOLINE) 100 MG tablet Take 1 tablet (100 mg total) by mouth 3 (three) times daily. 90 tablet 5  . ixazomib citrate (NINLARO) 3 MG capsule Take 1 capsule (3 mg total) by mouth once a week. Take on an empty stomach 1hr before or 2hrs after food. Do not crush, chew, or open. 4 capsule 1  . LORazepam (ATIVAN) 1 MG tablet Take 1 tablet (1 mg total) by mouth every 8 (eight) hours as needed for anxiety (or nausea). 30 tablet 0  . metoprolol (LOPRESSOR) 100 MG tablet Take 1 tablet (100 mg total) by mouth 2 (two) times daily. 60 tablet 5  . ondansetron (ZOFRAN) 8 MG tablet Take 8 mg by mouth every 8 (eight) hours as needed for nausea or vomiting. Reported on 07/08/2015    . oxyCODONE (OXY IR/ROXICODONE) 5 MG immediate release tablet Take 1 tablet (5 mg total) by mouth every 4 (four) hours as needed for severe pain. 60 tablet 0  . vitamin E 100 UNIT capsule Take 100 Units by mouth every other day. Reported on 07/08/2015     No current facility-administered medications for this visit.    REVIEW OF SYSTEMS:   Constitutional: Denies fevers, chills or abnormal night sweats Eyes: Denies blurriness of vision, double vision or watery eyes Ears, nose, mouth, throat, and face: Denies mucositis or sore throat Respiratory: Denies cough, dyspnea or wheezes Cardiovascular: Denies palpitation, chest discomfort or lower extremity swelling Gastrointestinal:  Denies nausea, heartburn or change in bowel habits Skin: Denies abnormal skin  rashes Lymphatics: Denies new lymphadenopathy or easy bruising Neurological:Denies numbness, tingling or new weaknesses Behavioral/Psych: Mood is stable, no new changes  All other systems were reviewed with the patient and are negative.   PHYSICAL EXAMINATION: ECOG PERFORMANCE STATUS: 2 - Symptomatic, <50% confined to bed  Filed Vitals:   06/28/15 1539  BP: 163/64  Pulse: 66  Temp: 97.6 F (36.4 C)  Resp: 18   Filed Weights   06/28/15 1539  Weight: 190 lb 11.2 oz (86.501 kg)    GENERAL:alert, no distress , appears to be in good spirits and is smiling and quite verbal . SKIN: significant conjunctival pallor noted.  EYES: nl EOM, PERL OROPHARYNX:no exudate, no erythema and lips, buccal mucosa, and tongue normal  NECK: no  JVD LYMPH:  no palpable lymphadenopathy in the cervical, axillary or inguinal LUNGS: clear to auscultation and percussion with normal breathing effort HEART: regular rate & rhythm and 2/6 SM aortic area ABDOMEN:abdomen soft, non-tender and normal bowel sounds Musculoskeletal: B/l trace pitting pedal edema No Tenderness. No redness.  PSYCH: alert & oriented x 3 with fluent speech NEURO: no focal motor/sensory deficits  LABORATORY DATA:   Component     Latest Ref Rng 06/03/2015 06/28/2015  WBC     4.0 - 10.3 10e3/uL  8.5  NEUT#     1.5 - 6.5 10e3/uL  7.7 (H)  Hemoglobin     13.0 - 17.1 g/dL  10.6 (L)  HCT     38.4 - 49.9 %  30.9 (L)  Platelets     140 - 400 10e3/uL  121 (L)  MCV     79.3 - 98.0 fL  88.8  MCH     27.2 - 33.4 pg  30.5  MCHC     32.0 - 36.0 g/dL  34.3  RBC     4.20 - 5.82 10e6/uL  3.48 (L)  RDW     11.0 - 14.6 %  14.8 (H)  lymph#     0.9 - 3.3 10e3/uL  0.4 (L)  MONO#     0.1 - 0.9 10e3/uL  0.2  Eosinophils Absolute     0.0 - 0.5 10e3/uL  0.1  Basophils Absolute     0.0 - 0.1 10e3/uL  0.0  NEUT%     39.0 - 75.0 %  90.8 (H)  LYMPH%     14.0 - 49.0 %  5.1 (L)  MONO%     0.0 - 14.0 %  2.4  EOS%     0.0 - 7.0 %  1.5   BASO%     0.0 - 2.0 %  0.2  Retic %     0.80 - 1.80 %  1.53  Retic Ct Abs     34.80 - 93.90 10e3/uL  53.24  Immature Retic Fract     3.00 - 10.60 %  5.00  Sodium     136 - 145 mEq/L  141  Potassium     3.5 - 5.1 mEq/L  3.7  Chloride     98 - 109 mEq/L  111 (H)  CO2     22 - 29 mEq/L  18 (L)  Glucose     70 - 140 mg/dl  135  BUN     7.0 - 26.0 mg/dL  34.1 (H)  Creatinine     0.7 - 1.3 mg/dL  3.6 (HH)  Total Bilirubin     0.20 - 1.20 mg/dL  0.35  Alkaline Phosphatase     40 - 150 U/L  80  AST     5 - 34 U/L  15  ALT     0 - 55 U/L  12  Total Protein     6.4 - 8.3 g/dL  7.2  Albumin     3.5 - 5.0 g/dL  3.7  Calcium     8.4 - 10.4 mg/dL  9.1  Anion gap     3 - 11 mEq/L  12 (H)  EGFR     >90 ml/min/1.73 m2  17 (L)  IgG (Immunoglobin G), Serum     700 - 1600 mg/dL 313 (L) 334 (L)  IgA/Immunoglobulin A, Serum     61 - 437 mg/dL 72 74  IgM, Qn, Serum  20 - 172 mg/dL 135 117  Total Protein     6.0 - 8.5 g/dL 5.8 (L) 6.4  Albumin SerPl Elph-Mcnc     2.9 - 4.4 g/dL 3.2 3.6  Alpha 1     0.0 - 0.4 g/dL 0.3 0.3  Alpha2 Glob SerPl Elph-Mcnc     0.4 - 1.0 g/dL 1.0 1.2 (H)  B-Globulin SerPl Elph-Mcnc     0.7 - 1.3 g/dL 0.9 1.0  Gamma Glob SerPl Elph-Mcnc     0.4 - 1.8 g/dL 0.4 0.4  M Protein SerPl Elph-Mcnc     Not Observed g/dL 0.1 (H) 0.1 (H)  Globulin, Total     2.2 - 3.9 g/dL 2.6 2.8  Albumin/Glob SerPl     0.7 - 1.7 1.3 1.3  IFE 1      Comment Comment  Please Note (HCV):      Comment Comment  Ig Kappa Free Light Chain     3.30 - 19.40 mg/L 35.76 (H) 29.03 (H)  Ig Lambda Free Light Chain     5.71 - 26.30 mg/L 25.83 24.69  Kappa/Lambda FluidC Ratio     0.26 - 1.65 1.38 1.18   RADIOGRAPHIC STUDIES: I have personally reviewed the radiological images as listed and agreed with the findings in the report.  .No results found.   Echocardiogram 06/10/2015:   [facility]*  *[facility] [employer].  [place], [place] [address]  [phone]  ------------------------------------------------------------------- Transthoracic Echocardiography  Patient: Mckay, Tegtmeyer MR #: [id number] Study Date: 06/10/2015 Gender: M Age: 18 Height: 172.7 cm Weight: 88 kg BSA: 2.08 m^2 Pt. Status: Room:  SONOGRAPHER Dumas, Outpatient ATTENDING Sullivan Lone [facility] Sullivan Lone Kishore  cc:  ------------------------------------------------------------------- LV EF: 60% - 65%  ------------------------------------------------------------------- History: PMH: Former Smoker, Renal Hematoma, Anemia. Amyloidosis. Risk factors: Hypertension.  ------------------------------------------------------------------- Study Conclusions  - Left ventricle: Global longitudinal LV strain is normal at -19.8%  The cavity size was normal. There was moderate concentric  hypertrophy. Systolic function was normal. The estimated ejection  fraction was in the range of 60% to 65%. Wall motion was normal;  there were no regional wall motion abnormalities. Features are  consistent with a pseudonormal left ventricular filling pattern,  with concomitant abnormal relaxation and increased filling  pressure (grade 2 diastolic dysfunction). Doppler parameters are  consistent with high ventricular filling pressure. - Aortic valve: Severe diffuse thickening and calcification. There  was very mild stenosis. There was mild regurgitation. Valve area  (VTI): 1.41 cm^2. Valve area (Vmax): 1.46 cm^2. Valve area  (Vmean): 1.47 cm^2.  ASSESSMENT & PLAN:    Mr Jose Garrett is very pleasant 65 yo caucasian male with   1) AL Amyloidosis noted on renal biopsy with nephrotic range proteinuria on urinalysis and some lambda free light chains on serum and urinary IFE . On  diagnosis:  SPEP with small amount of M-spike 0.3  Echo showed no systolic dysfunction but grade 2 diastolic dysfunction. [facility] cardiac staging 1 as per troponin and BNP criteria. Bone marrow with no overt evidence of multiple myeloma. PET/CT scan showed no evidence of bony lesions or lymphadenopathy . Urine showed about 15 g of protein in 24 hours   After 1 month of treatment the M-spike is down to 0.1gm/dl and urinary total protein is down to about 7.6g/24hours  After 2 months of treatment: M-spike in 0.1g/dl lambda free light chains improved and urinary total protein down to about 4 g per 24  hours With no monoclonal free light chains in the urine.  After 3 months of treatment: M spike still 0.1g/dl, difference in serum free light chains less than 4, urine protein down to 7222 mg per 24 hours which is less than 50% of the initial suggesting good renal response. Overall patient appears to also have partial to very good partial response based on his systemic markers of AL amyloidosis.  After 4 months of treatment: M protein spike 0.1g/dl ..still with significant proteinuria 9.5g/dl  After nearly 6 months of treatment: M protein 0.1g/dl (IgG with Lambda light chains), 24 hour UIFE total protein 7371 mg/day.  Has had hematologic response based on difference in [facility] <4   Labs from 04/08/2015 showed M-protein not detected. IFE neg. Renal function stable creatinine around 3. hgb improved.  Latest labs:  Patient has signs of progression.  Urinary protein has increased to 10.78g/24 hours (no M protein) from about 7.38g/24 hours. M protein in the blood which was undetectable has increased to 0.2 g/dL Serum free light chains increasing.    Patient recently started on Ixazomib '3mg'$  po qweekly ECHO 06/10/2015 with stable/NL EF and grade diastolic dysfunction  Plan -  No overt limiting toxicity from Ixazomib. No significant cytopenias. -continue  Ixazomib ('3mg'$  D1,8,15 q4weeks) +  Dexamethasone.  -we shall plan to consider adding renally dose adjusted Revlimid if inadequate response noted.  2) multifactorial anemia - likely multifactorial -from AL amyloidosis, anemia from CKD, chemotherapy. Hgb stable at 10.6  3) CKD -related to hypertension and AL Amyloidosis. Creatinine is in the 2.7 to 3 range and had improved from his previous high of 3.5. Creatinine is now gradually increasing again to 3.6 4) Feet tingling and numbness ? Related to AL Amyloidosis vs Velcade vs B12 deficiency stable. Started taking gabapentin 12/16/2014. Much improved symptoms. 5)Bilateral pedal edema much improved. Ultrasound of bilateral lower extremity is negative for DVT.  Plan -Continue current dose of Lasix with close monitoring of daily weights. -Continue subQ  B12 daily 28 days -Patient will get CBC, CMP and myeloma labs every monthly.  -continue neurontin for neuropathy.  -continue on low-dose aspirin for VTE prophylaxis given his high risk of venous thromboembolism in the setting of nephrotic syndrome.  -continue Acyclovir for shingles prophylaxis -Continue close f/u with PCP and nephrology.  Return to care with Dr. [name] in 3 weeks with cbc, cmp and rpt SPEP/QIG and kappa/lambda SFLC and urine protein  All questions were answered. The patient knows to call the clinic with any problems, questions or concerns. I spent 25 minutes counseling the patient face to face. The total time spent in the appointment was 25 minutes and more than 50% was on counseling.   Sullivan Lone MD [place] Hematology/Oncology Physician [facility]  (Office):       [phone] (Work cell):  [phone] (Fax):           [phone]

## 2015-07-28 NOTE — Progress Notes (Signed)
.    Hematology oncology clinic note  Date of service    Patient Care Team: [name], MD as PCP - General (Family Medicine) [name], MD as PCP - Hematology/Oncology (Hematology and Oncology) [name], MD as Consulting Physician (Nephrology)  CHIEF COMPLAINTS: followup for AL amyloidosis.  HISTORY OF PRESENTING ILLNESS: Please see my initial consultation for details on presentation.  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 AL amyloidosis noted on kidney biopsy.  Had about 15 g of protein per 24 hours which produced a less than 50% suggesting renal response to treatment. - no overt heart failure   Current treatment Ixazomib + Dexamethasone.   previous treatment  CyBorD x 6 cycles Evaluated for and declined Auto HSCT at [facility]. - maintenance Velcade  INTERVAL HISTORY  Jose. Jose Garrett is here for his followup prior to  Second cycle of Ixazomib and dexamethasone.  He notes that his energy levels are good and that he has no other acute new symptoms.  No significant leg swelling.  Creatinine is slowly increased to 3.8.     MEDICAL HISTORY:  Past Medical History  Diagnosis Date  . Hypertension Dx 2012  . Hyperlipidemia Dx 2012  . History of blood transfusion 09/08/2014    "got hematoma after renal biopsy & HgB dropped"  . Anemia Dx 2016  . Family history of adverse reaction to anesthesia     "my daughter can't take certain anesthesia agents" (09/08/2014)  . Enlarged heart 2015  . Chronic diastolic CHF (congestive heart failure), NYHA class 1 ([place])     /hospital problem list 09/08/2014  . Renal disorder     "there is a spot on my kidney" (09/08/2014)  . Chronic renal disease, stage III     /hospital problem list 09/08/2014    SURGICAL HISTORY: Past Surgical History  Procedure Laterality Date  . Renal biopsy, percutaneous Right 09/08/2014  . Tonsillectomy  ~ 1960  . Laparoscopic  cholecystectomy  03/2011    SOCIAL HISTORY: Social History   Social History  . Marital Status: Divorced    Spouse Name: N/A  . Number of Children: N/A  . Years of Education: N/A   Occupational History  . Not on file.   Social History Main Topics  . Smoking status: Former Smoker -- 2.00 packs/day for 40 years    Types: Cigarettes    Quit date: 03/19/2011  . Smokeless tobacco: Never Used  . Alcohol Use: No  . Drug Use: No     Comment: 09/08/2014 "I have used marijuana till the 1990's". Notes that he quit 15 yrs ago  . Sexual Activity: Not Currently   Other Topics Concern  . 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 Lives with his daughter and her finance.  FAMILY HISTORY: Family History  Problem Relation Age of Onset  . Hypertension Mother   . Diabetes Mother   . Heart disease Mother   . Skin cancer Mother   . Hypertension Father   . Diabetes Father   . Diabetes Sister     ALLERGIES:  has No Known Allergies.  MEDICATIONS:  Current Outpatient Prescriptions  Medication Sig Dispense Refill  . acyclovir (ZOVIRAX) 400 MG tablet TAKE 1 TABLET BY MOUTH DAILY. (DOSE ADJUSTED FOR RENAL FUNCTION) 60 tablet 3  . amLODipine (NORVASC) 10 MG tablet Take 1 tablet (10 mg total) by mouth daily. 30 tablet  5  . amoxicillin (AMOXIL) 400 MG/5ML suspension Take 6.3 mLs (500 mg total) by mouth 3 (three) times daily. 200 mL 0  . aspirin EC 81 MG tablet Take 1 tablet (81 mg total) by mouth daily. 30 tablet 11  . carbamide peroxide (DEBROX) 6.5 % otic solution Place 5 drops into the left ear 2 (two) times daily. 15 mL 0  . cetirizine (ZYRTEC) 5 MG tablet Take 1 tablet (5 mg total) by mouth daily. 30 tablet 5  . dexamethasone (DECADRON) 4 MG tablet Take 5 tablets (20 mg total) by mouth once every week. 15 tablet 1  . FLONASE 50 MCG/ACT nasal spray Place 1 spray into both nostrils daily. 15.8 g 0  . furosemide (LASIX) 40  MG tablet Take 1 tablet (40 mg total) by mouth 3 (three) times daily. 90 tablet 5  . gabapentin (NEURONTIN) 250 MG/5ML solution Take 6 mLs (300 mg total) by mouth at bedtime. May increased to 48m po HS if still having neuropathic pain 240 mL 1  . hydrALAZINE (APRESOLINE) 100 MG tablet Take 1 tablet (100 mg total) by mouth 3 (three) times daily. 90 tablet 5  . ixazomib citrate (NINLARO) 3 MG capsule Take 1 capsule (3 mg total) by mouth once a week. Take on an empty stomach 1hr before or 2hrs after food. Do not crush, chew, or open. 4 capsule 1  . LORazepam (ATIVAN) 1 MG tablet Take 1 tablet (1 mg total) by mouth every 8 (eight) hours as needed for anxiety (or nausea). 30 tablet 0  . metoprolol (LOPRESSOR) 100 MG tablet Take 1 tablet (100 mg total) by mouth 2 (two) times daily. 60 tablet 5  . ondansetron (ZOFRAN) 8 MG tablet Take 8 mg by mouth every 8 (eight) hours as needed for nausea or vomiting. Reported on 07/08/2015    . oxyCODONE (OXY IR/ROXICODONE) 5 MG immediate release tablet Take 1 tablet (5 mg total) by mouth every 4 (four) hours as needed for severe pain. 60 tablet 0  . vitamin E 100 UNIT capsule Take 100 Units by mouth every other day. Reported on 07/08/2015    . Cyanocobalamin (B-12) 1000 MCG SUBL Place 1,000 mcg under the tongue daily. 30 each 3   No current facility-administered medications for this visit.    REVIEW OF SYSTEMS:   Constitutional: Denies fevers, chills or abnormal night sweats Eyes: Denies blurriness of vision, double vision or watery eyes Ears, nose, mouth, throat, and face: Denies mucositis or sore throat Respiratory: Denies cough, dyspnea or wheezes Cardiovascular: Denies palpitation, chest discomfort or lower extremity swelling Gastrointestinal:  Denies nausea, heartburn or change in bowel habits Skin: Denies abnormal skin rashes Lymphatics: Denies new lymphadenopathy or easy bruising Neurological:Denies numbness, tingling or new weaknesses Behavioral/Psych:  Mood is stable, no new changes  All other systems were reviewed with the patient and are negative.   PHYSICAL EXAMINATION: ECOG PERFORMANCE STATUS: 2 - Symptomatic, <50% confined to bed  Filed Vitals:   07/19/15 0840  BP: 147/56  Pulse: 68  Temp: 98.2 F (36.8 C)  Resp: 18   Filed Weights   07/19/15 0840  Weight: 189 lb 8 oz (85.957 kg)    GENERAL:alert, no distress , appears to be in good spirits and is smiling and quite verbal . SKIN: significant conjunctival pallor noted.  EYES: nl EOM, PERL OROPHARYNX:no exudate, no erythema and lips, buccal mucosa, and tongue normal  NECK: no JVD LYMPH:  no palpable lymphadenopathy in the cervical, axillary or inguinal LUNGS: clear  to auscultation and percussion with normal breathing effort HEART: regular rate & rhythm and 2/6 SM aortic area ABDOMEN:abdomen soft, non-tender and normal bowel sounds Musculoskeletal: B/l trace pitting pedal edema No Tenderness. No redness.  PSYCH: alert & oriented x 3 with fluent speech NEURO: no focal motor/sensory deficits  LABORATORY DATA: .  [name] CBC Latest Ref Rng 07/19/2015 06/28/2015 06/03/2015  WBC 4.0 - 10.3 10e3/uL 5.6 8.5 4.5  Hemoglobin 13.0 - 17.1 g/dL 9.7(L) 10.6(L) 9.4(L)  Hematocrit 38.4 - 49.9 % 29.0(L) 30.9(L) 28.0(L)  Platelets 140 - 400 10e3/uL 133(L) 121(L) 101(L)   . CMP Latest Ref Rng 07/19/2015 07/19/2015 06/28/2015  Glucose 70 - 140 mg/dl 126 - 135  BUN 7.0 - 26.0 mg/dL 38.1(H) - 34.1(H)  Creatinine 0.7 - 1.3 mg/dL 3.8(HH) - 3.6(HH)  Sodium 136 - 145 mEq/L 141 - 141  Potassium 3.5 - 5.1 mEq/L 3.5 - 3.7  CO2 22 - 29 mEq/L 18(L) - 18(L)  Calcium 8.4 - 10.4 mg/dL 9.0 - 9.1  Total Protein 6.0 - 8.5 g/dL 6.6 5.9(L) 7.2  Total Bilirubin 0.20 - 1.20 mg/dL 0.33 - 0.35  Alkaline Phos 40 - 150 U/L 68 - 80  AST 5 - 34 U/L 15 - 15  ALT 0 - 55 U/L 15 - 12     RADIOGRAPHIC STUDIES: I have personally reviewed the radiological images as listed and agreed with the findings in the  report.  .No results found.   Echocardiogram 06/10/2015:   [facility]*  *[facility].  [place], [place] [address]  [phone]  ------------------------------------------------------------------- Transthoracic Echocardiography  Patient: Aaron, Bostwick Jose #: [id number] Study Date: 06/10/2015 Gender: M Age: 65 Height: 172.7 cm Weight: 88 kg BSA: 2.08 m^2 Pt. Status: Room:  SONOGRAPHER [place], Outpatient ATTENDING [facility]ore  cc:  ------------------------------------------------------------------- LV EF: 60% - 65%  ------------------------------------------------------------------- History: PMH: Former Smoker, Renal Hematoma, Anemia. Amyloidosis. Risk factors: Hypertension.  ------------------------------------------------------------------- Study Conclusions  - Left ventricle: Global longitudinal LV strain is normal at -19.8%  The cavity size was normal. There was moderate concentric  hypertrophy. Systolic function was normal. The estimated ejection  fraction was in the range of 60% to 65%. Wall motion was normal;  there were no regional wall motion abnormalities. Features are  consistent with a pseudonormal left ventricular filling pattern,  with concomitant abnormal relaxation and increased filling  pressure (grade 2 diastolic dysfunction). Doppler parameters are  consistent with high ventricular filling pressure. - Aortic valve: Severe diffuse thickening and calcification. There  was very mild stenosis. There was mild regurgitation. Valve area  (VTI): 1.41 cm^2. Valve area (Vmax): 1.46 cm^2. Valve area  (Vmean): 1.47 cm^2.  ASSESSMENT & PLAN:    Jose Garrett is very pleasant 65 yo caucasian male with    1) AL Amyloidosis noted on renal biopsy with nephrotic range proteinuria on urinalysis and some lambda free light chains on serum and urinary IFE . On diagnosis:  SPEP with small amount of M-spike 0.3  Echo showed no systolic dysfunction but grade 2 diastolic dysfunction. [facility] cardiac staging 1 as per troponin and BNP criteria. Bone marrow with no overt evidence of multiple myeloma. PET/CT scan showed no evidence of bony lesions or lymphadenopathy . Urine showed about 15 g of protein in 24 hours   After 1 month of treatment the M-spike is down to 0.1gm/dl and urinary total protein is down to about 7.6g/24hours  After 2 months  of treatment: M-spike in 0.1g/dl lambda free light chains improved and urinary total protein down to about 4 g per 24 hours With no monoclonal free light chains in the urine.  After 3 months of treatment: M spike still 0.1g/dl, difference in serum free light chains less than 4, urine protein down to 7222 mg per 24 hours which is less than 50% of the initial suggesting good renal response. Overall patient appears to also have partial to very good partial response based on his systemic markers of AL amyloidosis.  After 4 months of treatment: M protein spike 0.1g/dl ..still with significant proteinuria 9.5g/dl  After nearly 6 months of treatment: M protein 0.1g/dl (IgG with Lambda light chains), 24 hour UIFE total protein 7371 mg/day.  Has had hematologic response based on difference in [facility] <4   Labs from 04/08/2015 showed M-protein not detected. IFE neg. Renal function stable creatinine around 3. hgb improved.  Latest labs:  Patient has signs of progression.  Urinary protein has increased to 10.78g/24 hours (no M protein) from about 7.38g/24 hours. M protein in the blood which was undetectable has increased to 0.2 g/dL Serum free light chains increasing.    Patient s/p c1 of Ixazomib 31m po qweekly ECHO 06/10/2015 with stable/NL EF and grade diastolic  dysfunction  Plan -  No overt limiting toxicity from Ixazomib. No significant cytopenias. -continue  Ixazomib (310mD1,8,15 q4weeks) + Dexamethasone.  -awaiting myeloma panel results , Kappa/lambda light chains and 24h UPEP --will plan to add renally dose adjusted Revlimid if inadequate response noted. --continue on low-dose aspirin for VTE prophylaxis given his high risk of venous thromboembolism in the setting of nephrotic syndrome.  -continue Acyclovir for shingles prophylaxis  2) multifactorial anemia - likely multifactorial -from AL amyloidosis, anemia from CKD, chemotherapy. Hgb 9.7 - we'll check ferritin on next visit and replace as needed to maintain ferritin more than 100 in the setting of CK D. -continue B12 replacement - and Aranesp might need to be considered for worsening anemia.  3) CKD -related to hypertension and AL Amyloidosis. Creatinine is in the 2.7 to 3 range and had improved from his previous high of 3.5. Creatinine is now gradually increasing again to 3.8 4) Feet tingling and numbness ? Related to AL Amyloidosis vs Velcade vs B12 deficiency stable. Started taking gabapentin 12/16/2014. Much improved symptoms. 5)Bilateral pedal edema much improved. Ultrasound of bilateral lower extremity is negative for DVT.  Plan -lasix per PCP/nephrology. -Continue subQ  B12 daily 28 days -Continue close f/u with PCP and nephrology.  Return to care with Dr. KaIrene Limbon 4 weeks with cbc, cmp and rpt SPEP/QIG and kappa/lambda SFLC and urine protein  All questions were answered. The patient knows to call the clinic with any problems, questions or concerns. I spent 25 minutes counseling the patient face to face. The total time spent in the appointment was 25 minutes and more than 50% was on counseling.   GaSullivan LoneD MSBell Buckleematology/Oncology Physician CoMed[facility](Office):       [phone] cell):  [phone]):           [phone]

## 2015-08-01 ENCOUNTER — Encounter: Payer: Self-pay | Admitting: Family Medicine

## 2015-08-01 ENCOUNTER — Ambulatory Visit: Payer: Self-pay | Attending: Family Medicine | Admitting: Family Medicine

## 2015-08-01 VITALS — BP 161/63 | HR 66 | Temp 98.1°F | Resp 16 | Ht 69.0 in | Wt 195.0 lb

## 2015-08-01 DIAGNOSIS — I129 Hypertensive chronic kidney disease with stage 1 through stage 4 chronic kidney disease, or unspecified chronic kidney disease: Secondary | ICD-10-CM | POA: Insufficient documentation

## 2015-08-01 DIAGNOSIS — N184 Chronic kidney disease, stage 4 (severe): Secondary | ICD-10-CM | POA: Insufficient documentation

## 2015-08-01 DIAGNOSIS — Z7951 Long term (current) use of inhaled steroids: Secondary | ICD-10-CM | POA: Insufficient documentation

## 2015-08-01 DIAGNOSIS — E859 Amyloidosis, unspecified: Secondary | ICD-10-CM | POA: Insufficient documentation

## 2015-08-01 DIAGNOSIS — R5383 Other fatigue: Secondary | ICD-10-CM | POA: Insufficient documentation

## 2015-08-01 DIAGNOSIS — Z87891 Personal history of nicotine dependence: Secondary | ICD-10-CM | POA: Insufficient documentation

## 2015-08-01 DIAGNOSIS — D649 Anemia, unspecified: Secondary | ICD-10-CM | POA: Insufficient documentation

## 2015-08-01 DIAGNOSIS — Z79891 Long term (current) use of opiate analgesic: Secondary | ICD-10-CM | POA: Insufficient documentation

## 2015-08-01 DIAGNOSIS — Z792 Long term (current) use of antibiotics: Secondary | ICD-10-CM | POA: Insufficient documentation

## 2015-08-01 DIAGNOSIS — G629 Polyneuropathy, unspecified: Secondary | ICD-10-CM | POA: Insufficient documentation

## 2015-08-01 DIAGNOSIS — N185 Chronic kidney disease, stage 5: Secondary | ICD-10-CM | POA: Insufficient documentation

## 2015-08-01 DIAGNOSIS — Z7982 Long term (current) use of aspirin: Secondary | ICD-10-CM | POA: Insufficient documentation

## 2015-08-01 DIAGNOSIS — I10 Essential (primary) hypertension: Secondary | ICD-10-CM

## 2015-08-01 LAB — UPEP/TP, 24-HR URINE
ALPHA-2-GLOBULIN, U: 4.7 %
Albumin, U: 82.2 %
Alpha-1-Globulin, U: 1 %
Beta Globulin, U: 7.9 %
Gamma Globulin, U: 4.2 %
PROTEIN UR: 567.4 mg/dL
Prot,24hr calculated: 15887 mg/24 hr — ABNORMAL HIGH (ref 30–150)

## 2015-08-01 MED ORDER — CARVEDILOL 25 MG PO TABS: 25.0000 mg | ORAL_TABLET | Freq: Two times a day (BID) | ORAL | Status: DC

## 2015-08-01 NOTE — Assessment & Plan Note (Addendum)
Worsening of CKD without uremia or hyperkalemia   Patient will follow up with Nephology.

## 2015-08-01 NOTE — Progress Notes (Signed)
F/U HTN  Taking medication as prescribed  Former smoker Pain scale # 6 - back pain  No suicidal thoughts in the past two weeks

## 2015-08-01 NOTE — Patient Instructions (Addendum)
Jose Garrett was seen today for hypertension.  Diagnoses and all orders for this visit:  Essential hypertension, benign -     Discontinue: carvedilol (COREG) 25 MG tablet; Take 1 tablet (25 mg total) by mouth 2 (two) times daily with a meal. -     carvedilol (COREG) 25 MG tablet; Take 1 tablet (25 mg total) by mouth 2 (two) times daily with a meal.  CKD (chronic kidney disease) stage 4, GFR 15-29 ml/min (HCC)    Switch from metoprolol to coreg which has better BP reduction.  F/u in 3 weeks for HTN  Dr. Adrian Blackwater

## 2015-08-01 NOTE — Progress Notes (Signed)
Subjective:  Patient ID: Jose Garrett, male    DOB: 03/11/1951  Age: 65 y.o. MRN: [id number]  CC: Hypertension   HPI Jose Garrett has HTN, CKD, amyloidosis presents for    1. HTN: he is compliant with current regimen. He has very little edema. He wears his compression stockings daily. He is intermittently compliant with low salt diet. He admits to fatigue. No HA, vision change, CP, SOB. He continues to follow closely with oncology. He has been evaluated by Nephrology, Dr. [name] in the past for CKD and hx of R renal hematoma. He currently does not have a f/u appointment. He has rising Cr up to 3.8 with GFR 16 on 07/19/2015.   2. Fatigue: he reports getting 304 hrs of sleep per night. He has neuropathy in his legs. He does not take gabapentin every night just prn. He has anemia. He feels tired during the day.   Social History  Substance Use Topics  . Smoking status: Former Smoker -- 2.00 packs/day for 40 years    Types: Cigarettes    Quit date: 03/19/2011  . Smokeless tobacco: Never Used  . Alcohol Use: No   Outpatient Prescriptions Prior to Visit  Medication Sig Dispense Refill  . acyclovir (ZOVIRAX) 400 MG tablet TAKE 1 TABLET BY MOUTH DAILY. (DOSE ADJUSTED FOR RENAL FUNCTION) 60 tablet 3  . amLODipine (NORVASC) 10 MG tablet Take 1 tablet (10 mg total) by mouth daily. 30 tablet 5  . amoxicillin (AMOXIL) 400 MG/5ML suspension Take 6.3 mLs (500 mg total) by mouth 3 (three) times daily. 200 mL 0  . aspirin EC 81 MG tablet Take 1 tablet (81 mg total) by mouth daily. 30 tablet 11  . carbamide peroxide (DEBROX) 6.5 % otic solution Place 5 drops into the left ear 2 (two) times daily. 15 mL 0  . cetirizine (ZYRTEC) 5 MG tablet Take 1 tablet (5 mg total) by mouth daily. 30 tablet 5  . Cyanocobalamin (B-12) 1000 MCG SUBL Place 1,000 mcg under the tongue daily. 30 each 3  . dexamethasone (DECADRON) 4 MG tablet Take 5 tablets (20 mg total) by mouth once every week. 15 tablet 1  .  FLONASE 50 MCG/ACT nasal spray Place 1 spray into both nostrils daily. 15.8 g 0  . furosemide (LASIX) 40 MG tablet Take 1 tablet (40 mg total) by mouth 3 (three) times daily. 90 tablet 5  . gabapentin (NEURONTIN) 250 MG/5ML solution Take 6 mLs (300 mg total) by mouth at bedtime. May increased to 400mg  po HS if still having neuropathic pain 240 mL 1  . hydrALAZINE (APRESOLINE) 100 MG tablet Take 1 tablet (100 mg total) by mouth 3 (three) times daily. 90 tablet 5  . ixazomib citrate (NINLARO) 3 MG capsule Take 1 capsule (3 mg total) by mouth once a week. Take on an empty stomach 1hr before or 2hrs after food. Do not crush, chew, or open. 4 capsule 1  . LORazepam (ATIVAN) 1 MG tablet Take 1 tablet (1 mg total) by mouth every 8 (eight) hours as needed for anxiety (or nausea). 30 tablet 0  . metoprolol (LOPRESSOR) 100 MG tablet Take 1 tablet (100 mg total) by mouth 2 (two) times daily. 60 tablet 5  . ondansetron (ZOFRAN) 8 MG tablet Take 8 mg by mouth every 8 (eight) hours as needed for nausea or vomiting. Reported on 07/08/2015    . oxyCODONE (OXY IR/ROXICODONE) 5 MG immediate release tablet Take 1 tablet (5 mg total) by mouth  every 4 (four) hours as needed for severe pain. 60 tablet 0  . vitamin E 100 UNIT capsule Take 100 Units by mouth every other day. Reported on 07/08/2015     No facility-administered medications prior to visit.    ROS Review of Systems  Constitutional: Positive for fatigue. Negative for fever, chills and unexpected weight change.  Eyes: Negative for visual disturbance.  Respiratory: Negative for cough and shortness of breath.   Cardiovascular: Positive for leg swelling (b/l trace ). Negative for chest pain and palpitations.  Gastrointestinal: Negative for nausea, vomiting, abdominal pain, diarrhea, constipation and blood in stool.  Endocrine: Negative for polydipsia, polyphagia and polyuria.  Musculoskeletal: Negative for myalgias, back pain, arthralgias, gait problem and neck  pain.  Skin: Negative for rash.  Allergic/Immunologic: Negative for immunocompromised state.  Hematological: Negative for adenopathy. Does not bruise/bleed easily.  Psychiatric/Behavioral: Positive for sleep disturbance (poor sleep due to neuropathy ). Negative for suicidal ideas and dysphoric mood. The patient is not nervous/anxious.     Objective:  BP 161/63 mmHg  Pulse 66  Temp(Src) 98.1 F (36.7 C) (Oral)  Resp 16  Ht 5\' 9"  (1.753 m)  Wt 195 lb (88.451 kg)  BMI 28.78 kg/m2  SpO2 97%  BP/Weight 08/01/2015 07/19/2015 Q000111Q  Systolic BP Q000111Q Q000111Q [id number]  Diastolic BP 63 56 63  Wt. (Lbs) 195 189.5 191  BMI 28.78 27.97 28.19   Physical Exam  Constitutional: He appears well-developed and well-nourished. No distress.  HENT:  Head: Normocephalic and atraumatic.  Neck: Normal range of motion. Neck supple.  Cardiovascular: Normal rate, regular rhythm, normal heart sounds and intact distal pulses.   Pulmonary/Chest: Effort normal and breath sounds normal.  Musculoskeletal: He exhibits no edema.  Neurological: He is alert.  Skin: Skin is warm and dry. No rash noted. No erythema.  Psychiatric: He has a normal mood and affect.     Chemistry      Component Value Date/Time   NA 141 07/19/2015 0803   NA 140 03/04/2015 1012   K 3.5 07/19/2015 0803   K 3.3* 03/04/2015 1012   CL 106 03/04/2015 1012   CO2 18* 07/19/2015 0803   CO2 23 03/04/2015 1012   BUN 38.1* 07/19/2015 0803   BUN 45* 03/04/2015 1012   CREATININE 3.8* 07/19/2015 0803   CREATININE 3.43* 03/04/2015 1012   CREATININE 3.27* 10/04/2014 1223      Component Value Date/Time   CALCIUM 9.0 07/19/2015 0803   CALCIUM 8.6* 03/04/2015 1012   ALKPHOS 68 07/19/2015 0803   ALKPHOS 80 03/04/2015 1012   AST 15 07/19/2015 0803   AST 19 03/04/2015 1012   ALT 15 07/19/2015 0803   ALT 17 03/04/2015 1012   BILITOT 0.33 07/19/2015 0803   BILITOT 0.6 03/04/2015 1012       Assessment & Plan:   There are no diagnoses linked to  this encounter. Lish was seen today for hypertension.  Diagnoses and all orders for this visit:  Essential hypertension, benign -     Discontinue: carvedilol (COREG) 25 MG tablet; Take 1 tablet (25 mg total) by mouth 2 (two) times daily with a meal. -     carvedilol (COREG) 25 MG tablet; Take 1 tablet (25 mg total) by mouth 2 (two) times daily with a meal.  CKD (chronic kidney disease) stage 4, GFR 15-29 ml/min (HCC)   Meds ordered this encounter  Medications  . DISCONTD: carvedilol (COREG) 25 MG tablet    Sig: Take 1 tablet (25 mg  total) by mouth 2 (two) times daily with a meal.    Dispense:  60 tablet    Refill:  3  . carvedilol (COREG) 25 MG tablet    Sig: Take 1 tablet (25 mg total) by mouth 2 (two) times daily with a meal.    Dispense:  60 tablet    Refill:  3    Dc metoprolol    Follow-up: No Follow-up on file.   [name] MD

## 2015-08-01 NOTE — Assessment & Plan Note (Signed)
A: HTN with CKD stage 4 in setting of amyloidosis P: Change from metoprolol 100 mg BID to coreg 25 mg BID Continue hydralazine 100 mg TID Continue furosemide 40 mg TID Continue amlodipine 10 mg daily

## 2015-08-03 ENCOUNTER — Encounter: Payer: Self-pay | Admitting: Family Medicine

## 2015-08-09 ENCOUNTER — Other Ambulatory Visit: Payer: Self-pay | Admitting: *Deleted

## 2015-08-09 DIAGNOSIS — E8581 Light chain (AL) amyloidosis: Secondary | ICD-10-CM

## 2015-08-09 MED ORDER — IXAZOMIB CITRATE 3 MG PO CAPS: 3.0000 mg | ORAL_CAPSULE | ORAL | Status: DC

## 2015-08-10 ENCOUNTER — Telehealth: Payer: Self-pay | Admitting: Pharmacist

## 2015-08-10 ENCOUNTER — Other Ambulatory Visit: Payer: Self-pay | Admitting: *Deleted

## 2015-08-10 DIAGNOSIS — E8581 Light chain (AL) amyloidosis: Secondary | ICD-10-CM

## 2015-08-10 MED ORDER — IXAZOMIB CITRATE 3 MG PO CAPS: 3.0000 mg | ORAL_CAPSULE | ORAL | Status: DC

## 2015-08-10 NOTE — Telephone Encounter (Signed)
Received call in [facility] from [place] at [facility] assistance program needing refill on pts Ninlaro.  Need new Rx faxed in. Requested 2 month supply = 6 capsules.  Had Darden Dates, RN print new Rx w/ 6 capsules. Rx faxed to [phone] Ph# [phone] Kennith Center, Pharm.D., CPP 08/10/2015@9 :30 AM Oral Chemo Clinic

## 2015-08-10 NOTE — Telephone Encounter (Signed)
Oral Chemotherapy Follow-Up Form  Original Start date of oral chemotherapy: 06/21/15   Pt called oral chemo clinic today to check on refill of Ninlaro w/ Ninlaro [facility].  I informed him that I faxed Rx to them this morning.  We did discuss patient's oral chemotherapy medication: Ninlaro  Pt reports 0 tablets/doses missed in the last month.   Pt reports the following side effects: Fatigue - pt describes as deep, heavy fatigue.  Onset: 2-3 days after Ninlaro begins.  It is not unbearable per pt. Nausea - pt describes as mild.  Onset ~ 2 hrs after Ninlaro; intermittent.  Uses Ativan & helps but rarely uses Ativan.  He avoids it due to constipation and uses it only if nausea is bad.  Other Issues:  He has a "summer head cold."  Uses Zyrtec regularly for allergies.  He is using Benadryl prn for drainage.  The sinus drainage is clear.  He avoids decongestants due to HTN  Will follow up and call patient again on 08/31/15.  Thank you,  Kennith Center, Pharm.D., CPP 08/10/2015@12 :02 PM Oral Chemotherapy Clinic

## 2015-08-12 ENCOUNTER — Other Ambulatory Visit: Payer: Self-pay | Admitting: *Deleted

## 2015-08-12 DIAGNOSIS — E8581 Light chain (AL) amyloidosis: Secondary | ICD-10-CM

## 2015-08-12 MED ORDER — DEXAMETHASONE 4 MG PO TABS: ORAL_TABLET | ORAL | Status: DC

## 2015-08-14 ENCOUNTER — Emergency Department (HOSPITAL_COMMUNITY): Payer: Self-pay

## 2015-08-14 ENCOUNTER — Encounter (HOSPITAL_COMMUNITY): Payer: Self-pay | Admitting: Emergency Medicine

## 2015-08-14 ENCOUNTER — Emergency Department (HOSPITAL_COMMUNITY)
Admission: EM | Admit: 2015-08-14 | Discharge: 2015-08-14 | Disposition: A | Discharge status: Home / Self Care | Payer: Self-pay | Attending: Emergency Medicine | Admitting: Emergency Medicine

## 2015-08-14 DIAGNOSIS — I10 Essential (primary) hypertension: Secondary | ICD-10-CM

## 2015-08-14 DIAGNOSIS — J069 Acute upper respiratory infection, unspecified: Secondary | ICD-10-CM | POA: Insufficient documentation

## 2015-08-14 DIAGNOSIS — I5032 Chronic diastolic (congestive) heart failure: Secondary | ICD-10-CM | POA: Insufficient documentation

## 2015-08-14 DIAGNOSIS — I130 Hypertensive heart and chronic kidney disease with heart failure and stage 1 through stage 4 chronic kidney disease, or unspecified chronic kidney disease: Secondary | ICD-10-CM | POA: Insufficient documentation

## 2015-08-14 DIAGNOSIS — Z79899 Other long term (current) drug therapy: Secondary | ICD-10-CM | POA: Insufficient documentation

## 2015-08-14 DIAGNOSIS — N183 Chronic kidney disease, stage 3 (moderate): Secondary | ICD-10-CM | POA: Insufficient documentation

## 2015-08-14 DIAGNOSIS — J45909 Unspecified asthma, uncomplicated: Secondary | ICD-10-CM | POA: Insufficient documentation

## 2015-08-14 DIAGNOSIS — Z87891 Personal history of nicotine dependence: Secondary | ICD-10-CM | POA: Insufficient documentation

## 2015-08-14 DIAGNOSIS — Z7982 Long term (current) use of aspirin: Secondary | ICD-10-CM | POA: Insufficient documentation

## 2015-08-14 MED ORDER — ALBUTEROL SULFATE HFA 108 (90 BASE) MCG/ACT IN AERS
2.0000 | INHALATION_SPRAY | Freq: Once | RESPIRATORY_TRACT | Status: AC
  Administered 2015-08-14: 2 via RESPIRATORY_TRACT
  Filled 2015-08-14: qty 6.7

## 2015-08-14 MED ORDER — BENZONATATE 100 MG PO CAPS: 200.0000 mg | ORAL_CAPSULE | Freq: Two times a day (BID) | ORAL | Status: DC | PRN

## 2015-08-14 MED ORDER — PREDNISONE 20 MG PO TABS: 40.0000 mg | ORAL_TABLET | Freq: Every day | ORAL | Status: DC

## 2015-08-14 MED ORDER — BENZONATATE 100 MG PO CAPS
200.0000 mg | ORAL_CAPSULE | Freq: Once | ORAL | Status: AC
  Administered 2015-08-14: 200 mg via ORAL
  Filled 2015-08-14: qty 2

## 2015-08-14 MED ORDER — IPRATROPIUM-ALBUTEROL 0.5-2.5 (3) MG/3ML IN SOLN
3.0000 mL | Freq: Once | RESPIRATORY_TRACT | Status: AC
  Administered 2015-08-14: 3 mL via RESPIRATORY_TRACT
  Filled 2015-08-14: qty 3

## 2015-08-14 MED ORDER — GUAIFENESIN-CODEINE 100-10 MG/5ML PO SOLN: 5.0000 mL | Freq: Four times a day (QID) | ORAL | Status: DC | PRN

## 2015-08-14 MED ORDER — PREDNISONE 20 MG PO TABS
60.0000 mg | ORAL_TABLET | Freq: Once | ORAL | Status: AC
  Administered 2015-08-14: 60 mg via ORAL
  Filled 2015-08-14: qty 3

## 2015-08-14 MED ORDER — AEROCHAMBER PLUS W/MASK MISC
1.0000 | Freq: Once | Status: AC
  Administered 2015-08-14: 1
  Filled 2015-08-14: qty 1

## 2015-08-14 NOTE — ED Notes (Signed)
Pt c/o increasing productive cough x 5 days.  Pt reports taking OTC medications w/o relief.  Pt reports taking oral chemo and last dose was 2 weeks ago.

## 2015-08-14 NOTE — ED Provider Notes (Signed)
CSN: [id number]     Arrival date & time 08/14/15  0708 History   First MD Initiated Contact with Patient 08/14/15 [phone]     Chief Complaint  Patient presents with  . Cough     (Consider location/radiation/quality/duration/timing/severity/associated sxs/prior Treatment) HPI  Jose Garrett Is a 65 year old male with a past medical history of anemia, CHF, ckd. He has a distant history of tobacco smoking. The patient presents with chief complaint of severe cough. He developed URI symptoms 5 days ago. He has uncontrolled cough with episodes of gagging. He has not been taking any cough suppressants. He has been unable to sleep more than 2 hours at a time due to severe coughing. He denies any swelling, dyspnea on exertion. He denies a history of reactive airway or asthma. He denies fever, chills, bodyaches, urinary symptoms, abdominal pain, nausea or vomiting. He complains of pain in the right chest wall with cough, denies hemoptysis. Pain is worse with movement, deep breathing, coughing and painful to the touch. He denies any rash, injury, or eruptions  Past Medical History  Diagnosis Date  . Hypertension Dx 2012  . Hyperlipidemia Dx 2012  . History of blood transfusion 09/08/2014    "got hematoma after renal biopsy & HgB dropped"  . Anemia Dx 2016  . Family history of adverse reaction to anesthesia     "my daughter can't take certain anesthesia agents" (09/08/2014)  . Enlarged heart 2015  . Chronic diastolic CHF (congestive heart failure), NYHA class 1 ([place])     /hospital problem list 09/08/2014  . Renal disorder     "there is a spot on my kidney" (09/08/2014)  . Chronic renal disease, stage III     /hospital problem list 09/08/2014   Past Surgical History  Procedure Laterality Date  . Renal biopsy, percutaneous Right 09/08/2014  . Tonsillectomy  ~ 1960  . Laparoscopic cholecystectomy  03/2011   Family History  Problem Relation Age of Onset  . Hypertension Mother   . Diabetes Mother   .  Heart disease Mother   . Skin cancer Mother   . Hypertension Father   . Diabetes Father   . Diabetes Sister    Social History  Substance Use Topics  . Smoking status: Former Smoker -- 2.00 packs/day for 40 years    Types: Cigarettes    Quit date: 03/19/2011  . Smokeless tobacco: Never Used  . Alcohol Use: No    Review of Systems  Ten systems reviewed and are negative for acute change, except as noted in the HPI.     Allergies  Review of patient's allergies indicates no known allergies.  Home Medications   Prior to Admission medications   Medication Sig Start Date End Date Taking? Authorizing Provider  acyclovir (ZOVIRAX) 400 MG tablet TAKE 1 TABLET BY MOUTH DAILY. (DOSE ADJUSTED FOR RENAL FUNCTION) 06/01/15  Yes [name], MD  amLODipine (NORVASC) 10 MG tablet Take 1 tablet (10 mg total) by mouth daily. 06/30/15  Yes [name], MD  aspirin EC 81 MG tablet Take 1 tablet (81 mg total) by mouth daily. 01/14/15  Yes [name], MD  carvedilol (COREG) 25 MG tablet Take 1 tablet (25 mg total) by mouth 2 (two) times daily with a meal. Patient taking differently: Take 12.5 mg by mouth 2 (two) times daily with a meal.  08/01/15  Yes Josalyn Funches, MD  cetirizine (ZYRTEC) 5 MG tablet Take 1 tablet (5 mg total) by mouth daily. 06/30/15  Yes  [name], MD  Cyanocobalamin (B-12) 1000 MCG SUBL Place 1,000 mcg under the tongue daily. 07/19/15  Yes Gautam Juleen China, MD  dexamethasone (DECADRON) 4 MG tablet Take 5 tablets (20 mg total) by mouth once every week. 08/12/15 08/11/16 Yes Gautam Juleen China, MD  FLONASE 50 MCG/ACT nasal spray Place 1 spray into both nostrils daily. Patient taking differently: Place 1 spray into both nostrils daily as needed for allergies.  01/14/15  Yes [name], MD  furosemide (LASIX) 40 MG tablet Take 1 tablet (40 mg total) by mouth 3 (three) times daily. 06/30/15  Yes Josalyn Funches, MD  gabapentin (NEURONTIN) 250 MG/5ML  solution Take 6 mLs (300 mg total) by mouth at bedtime. May increased to 400mg  po HS if still having neuropathic pain 06/28/15  Yes Gautam Juleen China, MD  hydrALAZINE (APRESOLINE) 100 MG tablet Take 1 tablet (100 mg total) by mouth 3 (three) times daily. 06/30/15  Yes Josalyn Funches, MD  ixazomib citrate (NINLARO) 3 MG capsule Take 1 capsule (3 mg total) by mouth once a week. Take on an empty stomach 1hr before or 2hrs after food. Do not crush, chew, or open. 08/10/15  Yes [name], MD  ondansetron (ZOFRAN) 8 MG tablet Take 8 mg by mouth every 8 (eight) hours as needed for nausea or vomiting. Reported on 07/08/2015   Yes Historical Provider, MD  oxyCODONE (OXY IR/ROXICODONE) 5 MG immediate release tablet Take 1 tablet (5 mg total) by mouth every 4 (four) hours as needed for severe pain. 06/28/15  Yes [name], MD  vitamin E 100 UNIT capsule Take 100 Units by mouth every other day. Reported on 07/08/2015   Yes Historical Provider, MD  LORazepam (ATIVAN) 1 MG tablet Take 1 tablet (1 mg total) by mouth every 8 (eight) hours as needed for anxiety (or nausea). Patient not taking: Reported on 08/14/2015 10/11/14   [name], MD   BP 191/92 mmHg  Pulse 93  Temp(Src) 98.6 F (37 C) (Oral)  Resp 21  Ht 5\' 10"  (1.778 m)  Wt 86.183 kg  BMI 27.26 kg/m2  SpO2 94% Physical Exam  Constitutional: He appears well-developed and well-nourished. No distress.  HENT:  Head: Normocephalic and atraumatic.  Eyes: Conjunctivae are normal. No scleral icterus.  Neck: Normal range of motion. Neck supple.  Cardiovascular: Normal rate, regular rhythm and normal heart sounds.   Pulmonary/Chest: Effort normal. No respiratory distress. He has wheezes. He exhibits tenderness.    Abdominal: Soft. There is no tenderness.  Musculoskeletal: He exhibits no edema.  Neurological: He is alert.  Skin: Skin is warm and dry. He is not diaphoretic.  Psychiatric: His behavior is normal.  Nursing note and  vitals reviewed.   ED Course  Procedures (including critical care time) Labs Review Labs Reviewed - No data to display  Imaging Review Dg Chest 2 View  08/14/2015  CLINICAL DATA:  Productive cough for 5 days. EXAM: CHEST  2 VIEW COMPARISON:  None. FINDINGS: Heart is borderline in size. No confluent airspace opacities or effusions. No acute bony abnormality. Mild hyperinflation. IMPRESSION: Mild hyperinflation.  No acute findings. Electronically Signed   By: Rolm Baptise M.D.   On: 08/14/2015 07:54   I have personally reviewed and evaluated these images and lab results as part of my medical decision-making.   EKG Interpretation None      MDM   Final diagnoses:  None   Treated in ED with duoneb with sig improvement. D/c with tessalon  for daytime, cherratuss for night. Prednisone and albuterol. Pt CXR negative for acute infiltrate. Patients symptoms are consistent with URI, likely viral etiology. Discussed that antibiotics are not indicated for viral infections. Pt will be discharged with symptomatic treatment.  Verbalizes understanding and is agreeable with plan. Pt is hemodynamically stable & in NAD prior to dc.     Margarita Mail, PA-C 08/14/15 [place], MD 08/15/15 [phone]

## 2015-08-14 NOTE — ED Notes (Signed)
PA at bedside.

## 2015-08-14 NOTE — ED Notes (Signed)
Patient transported to X-ray 

## 2015-08-14 NOTE — ED Notes (Signed)
Pt c/o productive cough x5 days.Pt reports chills. Denies chest pain. Last chemo treatment three weeks ago.

## 2015-08-14 NOTE — ED Notes (Signed)
Verbalized understanding discharge instructions, prescriptions, and follow-up. In no acute distress.   

## 2015-08-14 NOTE — Discharge Instructions (Signed)
ALBUTEROL: 1-2 PUFFS EVERY 4 HOURS AS NEEDED FOR COUGH AND CHEST TIGHTNESS  Bronchospasm, Adult A bronchospasm is a spasm or tightening of the airways going into the lungs. During a bronchospasm breathing becomes more difficult because the airways get smaller. When this happens there can be coughing, a whistling sound when breathing (wheezing), and difficulty breathing. Bronchospasm is often associated with asthma, but not all patients who experience a bronchospasm have asthma. CAUSES  A bronchospasm is caused by inflammation or irritation of the airways. The inflammation or irritation may be triggered by:   Allergies (such as to animals, pollen, food, or mold). Allergens that cause bronchospasm may cause wheezing immediately after exposure or many hours later.   Infection. Viral infections are believed to be the most common cause of bronchospasm.   Exercise.   Irritants (such as pollution, cigarette smoke, strong odors, aerosol sprays, and paint fumes).   Weather changes. Winds increase molds and pollens in the air. Rain refreshes the air by washing irritants out. Cold air may cause inflammation.   Stress and emotional upset.  SIGNS AND SYMPTOMS   Wheezing.   Excessive nighttime coughing.   Frequent or severe coughing with a simple cold.   Chest tightness.   Shortness of breath.  DIAGNOSIS  Bronchospasm is usually diagnosed through a history and physical exam. Tests, such as chest X-rays, are sometimes done to look for other conditions. TREATMENT   Inhaled medicines can be given to open up your airways and help you breathe. The medicines can be given using either an inhaler or a nebulizer machine.  Corticosteroid medicines may be given for severe bronchospasm, usually when it is associated with asthma. HOME CARE INSTRUCTIONS   Always have a plan prepared for seeking medical care. Know when to call your health care provider and local emergency services (911 in the  [place] Know where you can access local emergency care.  Only take medicines as directed by your health care provider.  If you were prescribed an inhaler or nebulizer machine, ask your health care provider to explain how to use it correctly. Always use a spacer with your inhaler if you were given one.  It is necessary to remain calm during an attack. Try to relax and breathe more slowly.  Control your home environment in the following ways:   Change your heating and air conditioning filter at least once a month.   Limit your use of fireplaces and wood stoves.  Do not smoke and do not allow smoking in your home.   Avoid exposure to perfumes and fragrances.   Get rid of pests (such as roaches and mice) and their droppings.   Throw away plants if you see mold on them.   Keep your house clean and dust free.   Replace carpet with wood, tile, or vinyl flooring. Carpet can trap dander and dust.   Use allergy-proof pillows, mattress covers, and box spring covers.   Wash bed sheets and blankets every week in hot water and dry them in a dryer.   Use blankets that are made of polyester or cotton.   Wash hands frequently. SEEK MEDICAL CARE IF:   You have muscle aches.   You have chest pain.   The sputum changes from clear or white to yellow, green, gray, or bloody.   The sputum you cough up gets thicker.   There are problems that may be related to the medicine you are given, such as a rash, itching, swelling, or trouble breathing.  SEEK IMMEDIATE MEDICAL CARE IF:   You have worsening wheezing and coughing even after taking your prescribed medicines.   You have increased difficulty breathing.   You develop severe chest pain. MAKE SURE YOU:   Understand these instructions.  Will watch your condition.  Will get help right away if you are not doing well or get worse.   This information is not intended to replace advice given to you by your health care  provider. Make sure you discuss any questions you have with your health care provider.   Document Released: 03/01/2003 Document Revised: 03/19/2014 Document Reviewed: 08/18/2012 Elsevier Interactive Patient Education 2016 [employer]  Cough, Adult Coughing is a reflex that clears your throat and your airways. Coughing helps to heal and protect your lungs. It is normal to cough occasionally, but a cough that happens with other symptoms or lasts a long time may be a sign of a condition that needs treatment. A cough may last only 2-3 weeks (acute), or it may last longer than 8 weeks (chronic). CAUSES Coughing is commonly caused by:  Breathing in substances that irritate your lungs.  A viral or bacterial respiratory infection.  Allergies.  Asthma.  Postnasal drip.  Smoking.  Acid backing up from the stomach into the esophagus (gastroesophageal reflux).  Certain medicines.  Chronic lung problems, including COPD (or rarely, lung cancer).  Other medical conditions such as heart failure. HOME CARE INSTRUCTIONS  Pay attention to any changes in your symptoms. Take these actions to help with your discomfort:  Take medicines only as told by your health care provider.  If you were prescribed an antibiotic medicine, take it as told by your health care provider. Do not stop taking the antibiotic even if you start to feel better.  Talk with your health care provider before you take a cough suppressant medicine.  Drink enough fluid to keep your urine clear or pale yellow.  If the air is dry, use a cold steam vaporizer or humidifier in your bedroom or your home to help loosen secretions.  Avoid anything that causes you to cough at work or at home.  If your cough is worse at night, try sleeping in a semi-upright position.  Avoid cigarette smoke. If you smoke, quit smoking. If you need help quitting, ask your health care provider.  Avoid caffeine.  Avoid alcohol.  Rest as  needed. SEEK MEDICAL CARE IF:   You have new symptoms.  You cough up pus.  Your cough does not get better after 2-3 weeks, or your cough gets worse.  You cannot control your cough with suppressant medicines and you are losing sleep.  You develop pain that is getting worse or pain that is not controlled with pain medicines.  You have a fever.  You have unexplained weight loss.  You have night sweats. SEEK IMMEDIATE MEDICAL CARE IF:  You cough up blood.  You have difficulty breathing.  Your heartbeat is very fast.   This information is not intended to replace advice given to you by your health care provider. Make sure you discuss any questions you have with your health care provider.   Document Released: 08/25/2010 Document Revised: 11/17/2014 Document Reviewed: 05/05/2014 Elsevier Interactive Patient Education 2016 Else[employer] Inc.  Upper Respiratory Infection, Adult Most upper respiratory infections (URIs) are a viral infection of the air passages leading to the lungs. A URI affects the nose, throat, and upper air passages. The most common type of URI is nasopharyngitis and is typically referred  to as "the common cold." URIs run their course and usually go away on their own. Most of the time, a URI does not require medical attention, but sometimes a bacterial infection in the upper airways can follow a viral infection. This is called a secondary infection. Sinus and middle ear infections are common types of secondary upper respiratory infections. Bacterial pneumonia can also complicate a URI. A URI can worsen asthma and chronic obstructive pulmonary disease (COPD). Sometimes, these complications can require emergency medical care and may be life threatening.  CAUSES Almost all URIs are caused by viruses. A virus is a type of germ and can spread from one person to another.  RISKS FACTORS You may be at risk for a URI if:   You smoke.   You have chronic heart or lung  disease.  You have a weakened defense (immune) system.   You are very young or very old.   You have nasal allergies or asthma.  You work in crowded or poorly ventilated areas.  You work in health care facilities or schools. SIGNS AND SYMPTOMS  Symptoms typically develop 2-3 days after you come in contact with a cold virus. Most viral URIs last 7-10 days. However, viral URIs from the influenza virus (flu virus) can last 14-18 days and are typically more severe. Symptoms may include:   Runny or stuffy (congested) nose.   Sneezing.   Cough.   Sore throat.   Headache.   Fatigue.   Fever.   Loss of appetite.   Pain in your forehead, behind your eyes, and over your cheekbones (sinus pain).  Muscle aches.  DIAGNOSIS  Your health care provider may diagnose a URI by:  Physical exam.  Tests to check that your symptoms are not due to another condition such as:  Strep throat.  Sinusitis.  Pneumonia.  Asthma. TREATMENT  A URI goes away on its own with time. It cannot be cured with medicines, but medicines may be prescribed or recommended to relieve symptoms. Medicines may help:  Reduce your fever.  Reduce your cough.  Relieve nasal congestion. HOME CARE INSTRUCTIONS   Take medicines only as directed by your health care provider.   Gargle warm saltwater or take cough drops to comfort your throat as directed by your health care provider.  Use a warm mist humidifier or inhale steam from a shower to increase air moisture. This may make it easier to breathe.  Drink enough fluid to keep your urine clear or pale yellow.   Eat soups and other clear broths and maintain good nutrition.   Rest as needed.   Return to work when your temperature has returned to normal or as your health care provider advises. You may need to stay home longer to avoid infecting others. You can also use a face mask and careful hand washing to prevent spread of the  virus.  Increase the usage of your inhaler if you have asthma.   Do not use any tobacco products, including cigarettes, chewing tobacco, or electronic cigarettes. If you need help quitting, ask your health care provider. PREVENTION  The best way to protect yourself from getting a cold is to practice good hygiene.   Avoid oral or hand contact with people with cold symptoms.   Wash your hands often if contact occurs.  There is no clear evidence that vitamin C, vitamin E, echinacea, or exercise reduces the chance of developing a cold. However, it is always recommended to get plenty of rest, exercise,  and practice good nutrition.  SEEK MEDICAL CARE IF:   You are getting worse rather than better.   Your symptoms are not controlled by medicine.   You have chills.  You have worsening shortness of breath.  You have brown or red mucus.  You have yellow or brown nasal discharge.  You have pain in your face, especially when you bend forward.  You have a fever.  You have swollen neck glands.  You have pain while swallowing.  You have white areas in the back of your throat. SEEK IMMEDIATE MEDICAL CARE IF:   You have severe or persistent:  Headache.  Ear pain.  Sinus pain.  Chest pain.  You have chronic lung disease and any of the following:  Wheezing.  Prolonged cough.  Coughing up blood.  A change in your usual mucus.  You have a stiff neck.  You have changes in your:  Vision.  Hearing.  Thinking.  Mood. MAKE SURE YOU:   Understand these instructions.  Will watch your condition.  Will get help right away if you are not doing well or get worse.   This information is not intended to replace advice given to you by your health care provider. Make sure you discuss any questions you have with your health care provider.   Document Released: 08/22/2000 Document Revised: 07/13/2014 Document Reviewed: 06/03/2013 Elsevier Interactive Patient Education  [employer].

## 2015-08-16 ENCOUNTER — Encounter: Payer: Self-pay | Admitting: Hematology

## 2015-08-16 ENCOUNTER — Other Ambulatory Visit: Payer: Self-pay | Admitting: *Deleted

## 2015-08-16 ENCOUNTER — Ambulatory Visit (HOSPITAL_BASED_OUTPATIENT_CLINIC_OR_DEPARTMENT_OTHER): Payer: Self-pay | Admitting: Hematology

## 2015-08-16 ENCOUNTER — Other Ambulatory Visit (HOSPITAL_BASED_OUTPATIENT_CLINIC_OR_DEPARTMENT_OTHER): Payer: Self-pay

## 2015-08-16 VITALS — BP 166/50 | HR 101 | Temp 100.1°F | Resp 19 | Ht 70.0 in | Wt 195.9 lb

## 2015-08-16 DIAGNOSIS — N183 Chronic kidney disease, stage 3 unspecified: Secondary | ICD-10-CM

## 2015-08-16 DIAGNOSIS — J069 Acute upper respiratory infection, unspecified: Secondary | ICD-10-CM

## 2015-08-16 DIAGNOSIS — R609 Edema, unspecified: Secondary | ICD-10-CM

## 2015-08-16 DIAGNOSIS — D6481 Anemia due to antineoplastic chemotherapy: Secondary | ICD-10-CM

## 2015-08-16 DIAGNOSIS — E8581 Light chain (AL) amyloidosis: Secondary | ICD-10-CM

## 2015-08-16 DIAGNOSIS — E858 Other amyloidosis: Secondary | ICD-10-CM

## 2015-08-16 DIAGNOSIS — E538 Deficiency of other specified B group vitamins: Secondary | ICD-10-CM

## 2015-08-16 DIAGNOSIS — R809 Proteinuria, unspecified: Secondary | ICD-10-CM

## 2015-08-16 DIAGNOSIS — I129 Hypertensive chronic kidney disease with stage 1 through stage 4 chronic kidney disease, or unspecified chronic kidney disease: Secondary | ICD-10-CM

## 2015-08-16 DIAGNOSIS — D631 Anemia in chronic kidney disease: Secondary | ICD-10-CM

## 2015-08-16 LAB — COMPREHENSIVE METABOLIC PANEL
ALBUMIN: 2.9 g/dL — AB (ref 3.5–5.0)
ALK PHOS: 69 U/L (ref 40–150)
ALT: 16 U/L (ref 0–55)
ANION GAP: 13 meq/L — AB (ref 3–11)
AST: 27 U/L (ref 5–34)
BUN: 47.5 mg/dL — AB (ref 7.0–26.0)
CALCIUM: 9 mg/dL (ref 8.4–10.4)
CO2: 16 mEq/L — ABNORMAL LOW (ref 22–29)
Chloride: 105 mEq/L (ref 98–109)
Creatinine: 4 mg/dL (ref 0.7–1.3)
EGFR: 15 mL/min/{1.73_m2} — AB (ref >90)
Glucose: 109 mg/dl (ref 70–140)
POTASSIUM: 3.6 meq/L (ref 3.5–5.1)
Sodium: 134 mEq/L — ABNORMAL LOW (ref 136–145)
Total Bilirubin: 0.34 mg/dL (ref 0.20–1.20)
Total Protein: 6.7 g/dL (ref 6.4–8.3)

## 2015-08-16 LAB — CBC & DIFF AND RETIC
BASO%: 0 % (ref 0.0–2.0)
BASOS ABS: 0 10*3/uL (ref 0.0–0.1)
EOS ABS: 0 10*3/uL (ref 0.0–0.5)
EOS%: 0 % (ref 0.0–7.0)
HEMATOCRIT: 25.6 % — AB (ref 38.4–49.9)
HEMOGLOBIN: 8.7 g/dL — AB (ref 13.0–17.1)
Immature Retic Fract: 8.7 % (ref 3.00–10.60)
LYMPH%: 4 % — AB (ref 14.0–49.0)
MCH: 29.5 pg (ref 27.2–33.4)
MCHC: 34 g/dL (ref 32.0–36.0)
MCV: 86.8 fL (ref 79.3–98.0)
MONO#: 0.8 10*3/uL (ref 0.1–0.9)
MONO%: 8.4 % (ref 0.0–14.0)
NEUT#: 8.1 10*3/uL — ABNORMAL HIGH (ref 1.5–6.5)
NEUT%: 87.6 % — ABNORMAL HIGH (ref 39.0–75.0)
Platelets: 132 10*3/uL — ABNORMAL LOW (ref 140–400)
RBC: 2.95 10*6/uL — ABNORMAL LOW (ref 4.20–5.82)
RDW: 14 % (ref 11.0–14.6)
Retic %: 1.21 % (ref 0.80–1.80)
Retic Ct Abs: 35.7 10*3/uL (ref 34.80–93.90)
WBC: 9.3 10*3/uL (ref 4.0–10.3)
lymph#: 0.4 10*3/uL — ABNORMAL LOW (ref 0.9–3.3)

## 2015-08-16 LAB — FERRITIN: FERRITIN: 240 ng/mL (ref 22–316)

## 2015-08-16 MED ORDER — DOXYCYCLINE HYCLATE 100 MG PO TABS: 100.0000 mg | ORAL_TABLET | Freq: Two times a day (BID) | ORAL | Status: DC

## 2015-08-16 MED ORDER — OXYCODONE HCL 5 MG PO TABS: 5.0000 mg | ORAL_TABLET | ORAL | Status: DC | PRN

## 2015-08-16 MED ORDER — LORAZEPAM 1 MG PO TABS: 1.0000 mg | ORAL_TABLET | Freq: Three times a day (TID) | ORAL | Status: DC | PRN

## 2015-08-17 LAB — KAPPA/LAMBDA LIGHT CHAINS
IG KAPPA FREE LIGHT CHAIN: 30 mg/L — AB (ref 3.3–19.4)
IG LAMBDA FREE LIGHT CHAIN: 24.7 mg/L (ref 5.7–26.3)
KAPPA/LAMBDA FLC RATIO: 1.21 (ref 0.26–1.65)

## 2015-08-17 LAB — VITAMIN B12: Vitamin B12: 1020 pg/mL — ABNORMAL HIGH (ref 211–946)

## 2015-08-18 LAB — MULTIPLE MYELOMA PANEL, SERUM
ALBUMIN SERPL ELPH-MCNC: 2.9 g/dL (ref 2.9–4.4)
ALBUMIN/GLOB SERPL: 1.1 (ref 0.7–1.7)
ALPHA 1: 0.5 g/dL — AB (ref 0.0–0.4)
ALPHA2 GLOB SERPL ELPH-MCNC: 1.2 g/dL — AB (ref 0.4–1.0)
B-GLOBULIN SERPL ELPH-MCNC: 0.8 g/dL (ref 0.7–1.3)
GAMMA GLOB SERPL ELPH-MCNC: 0.2 g/dL — AB (ref 0.4–1.8)
GLOBULIN, TOTAL: 2.8 g/dL (ref 2.2–3.9)
IgA, Qn, Serum: 60 mg/dL — ABNORMAL LOW (ref 61–437)
IgG, Qn, Serum: 260 mg/dL — ABNORMAL LOW (ref 700–1600)
IgM, Qn, Serum: 92 mg/dL (ref 20–172)
Total Protein: 5.7 g/dL — ABNORMAL LOW (ref 6.0–8.5)

## 2015-08-22 ENCOUNTER — Ambulatory Visit: Payer: Self-pay | Attending: Family Medicine | Admitting: Family Medicine

## 2015-08-22 ENCOUNTER — Encounter: Payer: Self-pay | Admitting: Family Medicine

## 2015-08-22 VITALS — BP 165/62 | HR 73 | Temp 98.7°F | Resp 16 | Ht 69.0 in | Wt 185.0 lb

## 2015-08-22 DIAGNOSIS — Z87891 Personal history of nicotine dependence: Secondary | ICD-10-CM | POA: Insufficient documentation

## 2015-08-22 DIAGNOSIS — E859 Amyloidosis, unspecified: Secondary | ICD-10-CM | POA: Insufficient documentation

## 2015-08-22 DIAGNOSIS — R0602 Shortness of breath: Secondary | ICD-10-CM | POA: Insufficient documentation

## 2015-08-22 DIAGNOSIS — I10 Essential (primary) hypertension: Secondary | ICD-10-CM

## 2015-08-22 DIAGNOSIS — Z79899 Other long term (current) drug therapy: Secondary | ICD-10-CM | POA: Insufficient documentation

## 2015-08-22 DIAGNOSIS — I129 Hypertensive chronic kidney disease with stage 1 through stage 4 chronic kidney disease, or unspecified chronic kidney disease: Secondary | ICD-10-CM | POA: Insufficient documentation

## 2015-08-22 DIAGNOSIS — R05 Cough: Secondary | ICD-10-CM | POA: Insufficient documentation

## 2015-08-22 DIAGNOSIS — M7989 Other specified soft tissue disorders: Secondary | ICD-10-CM | POA: Insufficient documentation

## 2015-08-22 DIAGNOSIS — Z7982 Long term (current) use of aspirin: Secondary | ICD-10-CM | POA: Insufficient documentation

## 2015-08-22 DIAGNOSIS — N184 Chronic kidney disease, stage 4 (severe): Secondary | ICD-10-CM

## 2015-08-22 DIAGNOSIS — R5383 Other fatigue: Secondary | ICD-10-CM | POA: Insufficient documentation

## 2015-08-22 DIAGNOSIS — J302 Other seasonal allergic rhinitis: Secondary | ICD-10-CM

## 2015-08-22 DIAGNOSIS — I350 Nonrheumatic aortic (valve) stenosis: Secondary | ICD-10-CM

## 2015-08-22 DIAGNOSIS — J069 Acute upper respiratory infection, unspecified: Secondary | ICD-10-CM

## 2015-08-22 MED ORDER — BENZONATATE 100 MG PO CAPS: 200.0000 mg | ORAL_CAPSULE | Freq: Two times a day (BID) | ORAL | Status: DC | PRN

## 2015-08-22 MED ORDER — CARVEDILOL 25 MG PO TABS: 25.0000 mg | ORAL_TABLET | Freq: Two times a day (BID) | ORAL | Status: DC

## 2015-08-22 MED ORDER — CETIRIZINE HCL 5 MG PO TABS: 5.0000 mg | ORAL_TABLET | Freq: Every day | ORAL | Status: DC

## 2015-08-22 NOTE — Assessment & Plan Note (Signed)
Noted on ECHO 05/2015

## 2015-08-22 NOTE — Assessment & Plan Note (Signed)
Upper resp infection: patient treated with prednisone, doxycyline, mucinex with codeine, tessalon perles, zyrtec. Improving slowly but he has persistent cough and fatigue. Afebrile. Lung clear and no sinus tenderness on exam.  Plan; Finish doxycyline Continue zyrtec Continue mucinex with codeine prn cough and congestion Refilled tessalon perles

## 2015-08-22 NOTE — Assessment & Plan Note (Addendum)
CKD with falling GFR and Hgb Patient scheduled to see his nephrologist on next week 09/01/2015

## 2015-08-22 NOTE — Progress Notes (Signed)
F/U BP  HFU; UTI, productive cough and nose congestion x 2 weeks  Pain scale # 0 Former smoker  No suicidal thoughts in the past two weeks

## 2015-08-22 NOTE — Addendum Note (Signed)
Addended by: [name] on: 08/22/2015 10:02 AM   Modules accepted: Miquel Dunn

## 2015-08-22 NOTE — Addendum Note (Signed)
Addended by: [name] on: 08/22/2015 10:21 AM   Modules accepted: Orders, SmartSet

## 2015-08-22 NOTE — Assessment & Plan Note (Signed)
A: stable HTN Med: compliant, he is tolerant change from metoprolol to carvedilol. He is taking 12.5 mg BID P: Increase coreg to 25 mg BID

## 2015-08-22 NOTE — Progress Notes (Signed)
Patient ID: Jose Garrett, male   DOB: [birth year], 65 y.o.   MRN: [id number]   Subjective:  Patient ID: Jose Garrett, male    DOB: 1950-07-02  Age: 65 y.o. MRN: [id number]  CC: Hospitalization Follow-up and Hypertension   HPI Jose Garrett has HTN, CKD, amyloidosis AL with renal involvement he  presents for    1. HTN: he is compliant with current regimen. He has tolerated the transition from metoprolol 100 mg BID to carvedilol 12.5 mg BID.  He tried to take coreg 25 mg BID but experienced too much fatigue initially. He has very little edema. He wears his compression stockings daily. He is intermittently compliant with low salt diet. He admits to fatigue. No HA, vision change, CP, SOB. He continues to follow closely with oncology, Dr. [name]. He is also followed closely by Nephrology, Dr. [name], he has renal biopsy in 6/16 that confirmed AL AMYLOIDOSIS, MODERATE ARTERIOSCLEROSIS WITH MILD TO MODERATE TUBULOIONTERSTITIAL Jose Garrett. He developed R renal hematoma following the biopsy.  He has rising Cr up to 4 with GFR 15 on 08/16/2015.   2. URI: he wen to ED on 08/14/15 with cough and congestion. CXR was obtained and negative. He was treated with prednisone. Two days later he saw his oncologist who started him on doxycycline. He is almost done with the doxycyline, it was a 7 day course. He feels a bit better but still has cough that is intermittently productive. Last felt feverish 3 days ago, he took tylenol and that helped. He feels a bit SOB and fatigue, but this is chronic. No CP.    Social History  Substance Use Topics  . Smoking status: Former Smoker -- 2.00 packs/day for 40 years    Types: Cigarettes    Quit date: 03/19/2011  . Smokeless tobacco: Never Used  . Alcohol Use: No   Outpatient Prescriptions Prior to Visit  Medication Sig Dispense Refill  . acyclovir (ZOVIRAX) 400 MG tablet TAKE 1 TABLET BY MOUTH DAILY. (DOSE ADJUSTED FOR RENAL FUNCTION) 60 tablet 3  . amLODipine (NORVASC)  10 MG tablet Take 1 tablet (10 mg total) by mouth daily. 30 tablet 5  . aspirin EC 81 MG tablet Take 1 tablet (81 mg total) by mouth daily. 30 tablet 11  . carvedilol (COREG) 25 MG tablet Take 1 tablet (25 mg total) by mouth 2 (two) times daily with a meal. (Patient taking differently: Take 12.5 mg by mouth 2 (two) times daily with a meal. ) 60 tablet 3  . cetirizine (ZYRTEC) 5 MG tablet Take 1 tablet (5 mg total) by mouth daily. 30 tablet 5  . Cyanocobalamin (B-12) 1000 MCG SUBL Place 1,000 mcg under the tongue daily. 30 each 3  . dexamethasone (DECADRON) 4 MG tablet Take 5 tablets (20 mg total) by mouth once every week. 15 tablet 1  . doxycycline (VIBRA-TABS) 100 MG tablet Take 1 tablet (100 mg total) by mouth 2 (two) times daily. 14 tablet 0  . furosemide (LASIX) 40 MG tablet Take 1 tablet (40 mg total) by mouth 3 (three) times daily. 90 tablet 5  . gabapentin (NEURONTIN) 250 MG/5ML solution Take 6 mLs (300 mg total) by mouth at bedtime. May increased to 400mg  po HS if still having neuropathic pain 240 mL 1  . guaiFENesin-codeine 100-10 MG/5ML syrup Take 5-10 mLs by mouth every 6 (six) hours as needed for cough. 120 mL 0  . hydrALAZINE (APRESOLINE) 100 MG tablet Take 1 tablet (100 mg total) by mouth  3 (three) times daily. 90 tablet 5  . ixazomib citrate (NINLARO) 3 MG capsule Take 1 capsule (3 mg total) by mouth once a week. Take on an empty stomach 1hr before or 2hrs after food. Do not crush, chew, or open. 6 capsule 1  . LORazepam (ATIVAN) 1 MG tablet Take 1 tablet (1 mg total) by mouth every 8 (eight) hours as needed for anxiety (or nausea). 30 tablet 0  . ondansetron (ZOFRAN) 8 MG tablet Take 8 mg by mouth every 8 (eight) hours as needed for nausea or vomiting. Reported on 07/08/2015    . vitamin E 100 UNIT capsule Take 100 Units by mouth every other day. Reported on 07/08/2015    . benzonatate (TESSALON) 100 MG capsule Take 2 capsules (200 mg total) by mouth 2 (two) times daily as needed for  cough. (Patient not taking: Reported on 08/22/2015) 20 capsule 0  . FLONASE 50 MCG/ACT nasal spray Place 1 spray into both nostrils daily. (Patient not taking: Reported on 08/22/2015) 15.8 g 0  . oxyCODONE (OXY IR/ROXICODONE) 5 MG immediate release tablet Take 1 tablet (5 mg total) by mouth every 4 (four) hours as needed for severe pain. (Patient not taking: Reported on 08/22/2015) 60 tablet 0  . predniSONE (DELTASONE) 20 MG tablet Take 2 tablets (40 mg total) by mouth daily. (Patient not taking: Reported on 08/22/2015) 10 tablet 0   No facility-administered medications prior to visit.    ROS Review of Systems  Constitutional: Positive for fatigue. Negative for fever, chills and unexpected weight change.  HENT: Positive for congestion.   Eyes: Negative for visual disturbance.  Respiratory: Positive for cough and shortness of breath.   Cardiovascular: Positive for leg swelling (b/l trace ). Negative for chest pain and palpitations.  Gastrointestinal: Negative for nausea, vomiting, abdominal pain, diarrhea, constipation and blood in stool.  Endocrine: Negative for polydipsia, polyphagia and polyuria.  Musculoskeletal: Negative for myalgias, back pain, arthralgias, gait problem and neck pain.  Skin: Negative for rash.  Allergic/Immunologic: Negative for immunocompromised state.  Hematological: Negative for adenopathy. Does not bruise/bleed easily.  Psychiatric/Behavioral: Positive for sleep disturbance (poor sleep due to neuropathy ). Negative for suicidal ideas and dysphoric mood. The patient is not nervous/anxious.     Objective:  BP 165/62 mmHg  Pulse 73  Temp(Src) 98.7 F (37.1 C) (Oral)  Resp 16  Ht 5\' 9"  (1.753 m)  Wt 185 lb (83.915 kg)  BMI 27.31 kg/m2  SpO2 97%  BP/Weight 08/22/2015 [id number]  Systolic BP [id number]  Diastolic BP 62 50 68  Wt. (Lbs) 185 195.9 190  BMI 27.31 28.11 27.26   Wt Readings from Last 3 Encounters:  08/22/15 185 lb (83.915 kg)  08/16/15 195  lb 14.4 oz (88.86 kg)  08/14/15 190 lb (86.183 kg)    Physical Exam  Constitutional: He appears well-developed and well-nourished. No distress.  HENT:  Head: Normocephalic and atraumatic.  Right Ear: Tympanic membrane, external ear and ear canal normal.  Left Ear: External ear normal.  Nose: Mucosal edema present. Right sinus exhibits no maxillary sinus tenderness and no frontal sinus tenderness. Left sinus exhibits no maxillary sinus tenderness and no frontal sinus tenderness.  Mouth/Throat: Oropharynx is clear and moist. No oropharyngeal exudate.  Cerumen R ear   Neck: Normal range of motion. Neck supple.  Cardiovascular: Normal rate, regular rhythm and intact distal pulses.   Murmur heard.  Decrescendo systolic murmur is present with a grade of 2/6  Pulmonary/Chest: Effort normal and  breath sounds normal.  Musculoskeletal: He exhibits no edema.  Neurological: He is alert.  Skin: Skin is warm and dry. No rash noted. No erythema.  Psychiatric: He has a normal mood and affect.     Chemistry      Component Value Date/Time   NA 134* 08/16/2015 0828   NA 140 03/04/2015 1012   K 3.6 08/16/2015 0828   K 3.3* 03/04/2015 1012   CL 106 03/04/2015 1012   CO2 16* 08/16/2015 0828   CO2 23 03/04/2015 1012   BUN 47.5* 08/16/2015 0828   BUN 45* 03/04/2015 1012   CREATININE 4.0* 08/16/2015 0828   CREATININE 3.43* 03/04/2015 1012   CREATININE 3.27* 10/04/2014 1223      Component Value Date/Time   CALCIUM 9.0 08/16/2015 0828   CALCIUM 8.6* 03/04/2015 1012   ALKPHOS 69 08/16/2015 0828   ALKPHOS 80 03/04/2015 1012   AST 27 08/16/2015 0828   AST 19 03/04/2015 1012   ALT 16 08/16/2015 0828   ALT 17 03/04/2015 1012   BILITOT 0.34 08/16/2015 0828   BILITOT 0.6 03/04/2015 1012       Assessment & Plan:   There are no diagnoses linked to this encounter. Mica was seen today for hospitalization follow-up and hypertension.  Diagnoses and all orders for this visit:  Essential  hypertension, benign -     Cancel: BASIC METABOLIC PANEL WITH GFR -     carvedilol (COREG) 25 MG tablet; Take 1 tablet (25 mg total) by mouth 2 (two) times daily with a meal.  Acute upper respiratory infection -     benzonatate (TESSALON) 100 MG capsule; Take 2 capsules (200 mg total) by mouth 2 (two) times daily as needed for cough.  CKD (chronic kidney disease) stage 4, GFR 15-29 ml/min (HCC)  Mild aortic stenosis   No orders of the defined types were placed in this encounter.    Follow-up: No Follow-up on file.   [name] MD

## 2015-08-22 NOTE — Addendum Note (Signed)
Addended by: [name] on: 08/22/2015 10:11 AM   Modules accepted: Orders, SmartSet

## 2015-08-22 NOTE — Patient Instructions (Addendum)
Jose Garrett was seen today for hospitalization follow-up and hypertension.  Diagnoses and all orders for this visit:  Essential hypertension, benign -     Cancel: BASIC METABOLIC PANEL WITH GFR -     carvedilol (COREG) 25 MG tablet; Take 1 tablet (25 mg total) by mouth 2 (two) times daily with a meal.  Acute upper respiratory infection -     benzonatate (TESSALON) 100 MG capsule; Take 2 capsules (200 mg total) by mouth 2 (two) times daily as needed for cough.   Increase coreg 25 mg BID.  You have a f/u appt with Dr. [name] on next Thursday September 01, 2015 at 1 PM at [facility].  F/u with me in 4 week for HTN f/u   Dr. Adrian Blackwater

## 2015-08-24 NOTE — Progress Notes (Signed)
.    Hematology oncology clinic note  Date of service  08/16/2015 Patient Care Team: [name], MD as PCP - General (Family Medicine) [name], MD as PCP - Hematology/Oncology (Hematology and Oncology) [name], MD as Consulting Physician (Nephrology)  CHIEF COMPLAINTS: followup for AL amyloidosis.  HISTORY OF PRESENTING ILLNESS: Please see my initial consultation for details on presentation.  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 AL amyloidosis noted on kidney biopsy.  Had about 15 g of protein per 24 hours which produced a less than 50% suggesting renal response to treatment. - no overt heart failure   Current treatment Ixazomib + Dexamethasone s/p 2 cycles   previous treatment  CyBorD x 6 cycles Evaluated for and declined Auto HSCT at [facility] - maintenance Velcade  INTERVAL HISTORY  Jose Garrett is here for his followup prior to third cycle of Ixazomib and dexamethasone.  He appears to be having a bad upper respiratory infection which could be viral but we did start him on doxycycline due to concerns with them being hypogammaglobulinemic on his current proteosome inhibitor therapy. No acute fevers or chills. Notes significant fatigue. Clear phlegm. No overt shortness of breath. No significant or extremity edema. No new skin rashes. No diarrhea.   MEDICAL HISTORY:  Past Medical History  Diagnosis Date  . Hypertension Dx 2012  . Hyperlipidemia Dx 2012  . History of blood transfusion 09/08/2014    "got hematoma after renal biopsy & HgB dropped"  . Anemia Dx 2016  . Family history of adverse reaction to anesthesia     "my daughter can't take certain anesthesia agents" (09/08/2014)  . Enlarged heart 2015  . Chronic diastolic CHF (congestive heart failure), NYHA class 1 (HCC)     /hospital problem list 09/08/2014  . Renal disorder     "there is a spot on my kidney" (09/08/2014)  .  Chronic renal disease, stage III     /hospital problem list 09/08/2014    SURGICAL HISTORY: Past Surgical History  Procedure Laterality Date  . Renal biopsy, percutaneous Right 09/08/2014  . Tonsillectomy  ~ 1960  . Laparoscopic cholecystectomy  03/2011    SOCIAL HISTORY: Social History   Social History  . Marital Status: Divorced    Spouse Name: N/A  . Number of Children: N/A  . Years of Education: N/A   Occupational History  . Not on file.   Social History Main Topics  . Smoking status: Former Smoker -- 2.00 packs/day for 40 years    Types: Cigarettes    Quit date: 03/19/2011  . Smokeless tobacco: Never Used  . Alcohol Use: No  . Drug Use: No     Comment: 09/08/2014 "I have used marijuana till the 1990's". Notes that he quit 15 yrs ago  . Sexual Activity: Not Currently   Other Topics Concern  . Not on file   Social History Narrative   Previously worked Education officer, [employer]. Lives with his daughter and her finance.  FAMILY HISTORY: Family History  Problem Relation Age of Onset  . Hypertension Mother   . Diabetes Mother   . Heart disease Mother   . Skin cancer Mother   . Hypertension Father   . Diabetes Father   . Diabetes Sister     ALLERGIES:  has No Known Allergies.  MEDICATIONS:  Current Outpatient Prescriptions  Medication Sig Dispense Refill  . acyclovir (ZOVIRAX) 400 MG tablet TAKE 1  TABLET BY MOUTH DAILY. (DOSE ADJUSTED FOR RENAL FUNCTION) 60 tablet 3  . amLODipine (NORVASC) 10 MG tablet Take 1 tablet (10 mg total) by mouth daily. 30 tablet 5  . aspirin EC 81 MG tablet Take 1 tablet (81 mg total) by mouth daily. 30 tablet 11  . Cyanocobalamin (B-12) 1000 MCG SUBL Place 1,000 mcg under the tongue daily. 30 each 3  . dexamethasone (DECADRON) 4 MG tablet Take 5 tablets (20 mg total) by mouth once every week. 15 tablet 1  . FLONASE 50 MCG/ACT nasal spray Place 1 spray into both nostrils daily. (Patient not taking: Reported  on 08/22/2015) 15.8 g 0  . furosemide (LASIX) 40 MG tablet Take 1 tablet (40 mg total) by mouth 3 (three) times daily. 90 tablet 5  . gabapentin (NEURONTIN) 250 MG/5ML solution Take 6 mLs (300 mg total) by mouth at bedtime. May increased to '400mg'$  po HS if still having neuropathic pain 240 mL 1  . guaiFENesin-codeine 100-10 MG/5ML syrup Take 5-10 mLs by mouth every 6 (six) hours as needed for cough. 120 mL 0  . hydrALAZINE (APRESOLINE) 100 MG tablet Take 1 tablet (100 mg total) by mouth 3 (three) times daily. 90 tablet 5  . ixazomib citrate (NINLARO) 3 MG capsule Take 1 capsule (3 mg total) by mouth once a week. Take on an empty stomach 1hr before or 2hrs after food. Do not crush, chew, or open. 6 capsule 1  . LORazepam (ATIVAN) 1 MG tablet Take 1 tablet (1 mg total) by mouth every 8 (eight) hours as needed for anxiety (or nausea). 30 tablet 0  . ondansetron (ZOFRAN) 8 MG tablet Take 8 mg by mouth every 8 (eight) hours as needed for nausea or vomiting. Reported on 07/08/2015    . oxyCODONE (OXY IR/ROXICODONE) 5 MG immediate release tablet Take 1 tablet (5 mg total) by mouth every 4 (four) hours as needed for severe pain. (Patient not taking: Reported on 08/22/2015) 60 tablet 0  . vitamin E 100 UNIT capsule Take 100 Units by mouth every other day. Reported on 07/08/2015    . benzonatate (TESSALON) 100 MG capsule Take 2 capsules (200 mg total) by mouth 2 (two) times daily as needed for cough. 20 capsule 0  . carvedilol (COREG) 25 MG tablet Take 1 tablet (25 mg total) by mouth 2 (two) times daily with a meal. 60 tablet 5  . cetirizine (ZYRTEC) 5 MG tablet Take 1 tablet (5 mg total) by mouth daily. 30 tablet 5  . doxycycline (VIBRA-TABS) 100 MG tablet Take 1 tablet (100 mg total) by mouth 2 (two) times daily. 14 tablet 0   No current facility-administered medications for this visit.    REVIEW OF SYSTEMS:   Constitutional: Denies fevers, chills or abnormal night sweats Eyes: Denies blurriness of vision,  double vision or watery eyes Ears, nose, mouth, throat, and face: Denies mucositis or sore throat Respiratory: Denies cough, dyspnea or wheezes Cardiovascular: Denies palpitation, chest discomfort or lower extremity swelling Gastrointestinal:  Denies nausea, heartburn or change in bowel habits Skin: Denies abnormal skin rashes Lymphatics: Denies new lymphadenopathy or easy bruising Neurological:Denies numbness, tingling or new weaknesses Behavioral/Psych: Mood is stable, no new changes  All other systems were reviewed with the patient and are negative.   PHYSICAL EXAMINATION: ECOG PERFORMANCE STATUS: 2 - Symptomatic, <50% confined to bed  Filed Vitals:   08/16/15 0908  BP: 166/50  Pulse: 101  Temp: 100.1 F (37.8 C)  Resp: 19   Filed Weights  08/16/15 0908  Weight: 195 lb 14.4 oz (88.86 kg)    GENERAL:alert, Mild this is due to sinus congestion and sore throat and cough. SKIN: significant conjunctival pallor noted.  EYES: nl EOM, PERL OROPHARYNX: Sinus congestion with mild sinus tenderness, erythematous pharynx with no overt exudates. No cervical adenopathy.  NECK: no JVD LYMPH:  no palpable lymphadenopathy in the cervical, axillary or inguinal LUNGS: clear to auscultation and percussion with normal breathing effort HEART: regular rate & rhythm and 2/6 SM aortic area ABDOMEN:abdomen soft, non-tender and normal bowel sounds Musculoskeletal: B/l trace pitting pedal edema No Tenderness. No redness.  PSYCH: alert & oriented x 3 with fluent speech NEURO: no focal motor/sensory deficits  LABORATORY DATA:   . CBC Latest Ref Rng 08/16/2015 07/19/2015 06/28/2015  WBC 4.0 - 10.3 10e3/uL 9.3 5.6 8.5  Hemoglobin 13.0 - 17.1 g/dL 8.7(L) 9.7(L) 10.6(L)  Hematocrit 38.4 - 49.9 % 25.6(L) 29.0(L) 30.9(L)  Platelets 140 - 400 10e3/uL 132(L) 133(L) 121(L)   . CMP Latest Ref Rng 08/16/2015 08/16/2015 07/19/2015  Glucose 70 - 140 mg/dl 109 - 126  BUN 7.0 - 26.0 mg/dL 47.5(H) - 38.1(H)    Creatinine 0.7 - 1.3 mg/dL 4.0(HH) - 3.8(HH)  Sodium 136 - 145 mEq/L 134(L) - 141  Potassium 3.5 - 5.1 mEq/L 3.6 - 3.5  CO2 22 - 29 mEq/L 16(L) - 18(L)  Calcium 8.4 - 10.4 mg/dL 9.0 - 9.0  Total Protein 6.0 - 8.5 g/dL 6.7 5.7(L) 6.6  Total Bilirubin 0.20 - 1.20 mg/dL 0.34 - 0.33  Alkaline Phos 40 - 150 U/L 69 - 68  AST 5 - 34 U/L 27 - 15  ALT 0 - 55 U/L 16 - 15          RADIOGRAPHIC STUDIES: I have personally reviewed the radiological images as listed and agreed with the findings in the report.  .Dg Chest 2 View  08/14/2015  CLINICAL DATA:  Productive cough for 5 days. EXAM: CHEST  2 VIEW COMPARISON:  None. FINDINGS: Heart is borderline in size. No confluent airspace opacities or effusions. No acute bony abnormality. Mild hyperinflation. IMPRESSION: Mild hyperinflation.  No acute findings. Electronically Signed   By: Rolm Baptise M.D.   On: 08/14/2015 07:54     Echocardiogram 06/10/2015:   [facility]*  *[place] [employer].  [place], [place] [address]  [phone]  ------------------------------------------------------------------- Transthoracic Echocardiography  Patient: Jose Garrett, Jose Garrett MR #: [id number] Study Date: 06/10/2015 Gender: M Age: 38 Height: 172.7 cm Weight: 88 kg BSA: 2.08 m^2 Pt. Status: Room:  SONOGRAPHER [place], Outpatient ATTENDING Sullivan Lone [facility] Sullivan Lone Kishore  cc:  ------------------------------------------------------------------- LV EF: 60% - 65%  ------------------------------------------------------------------- History: PMH: Former Smoker, Renal Hematoma, Anemia. Amyloidosis. Risk factors: Hypertension.  ------------------------------------------------------------------- Study Conclusions  - Left ventricle:  Global longitudinal LV strain is normal at -19.8%  The cavity size was normal. There was moderate concentric  hypertrophy. Systolic function was normal. The estimated ejection  fraction was in the range of 60% to 65%. Wall motion was normal;  there were no regional wall motion abnormalities. Features are  consistent with a pseudonormal left ventricular filling pattern,  with concomitant abnormal relaxation and increased filling  pressure (grade 2 diastolic dysfunction). Doppler parameters are  consistent with high ventricular filling pressure. - Aortic valve: Severe diffuse thickening and calcification. There  was very mild stenosis. There was mild regurgitation. Valve area  (VTI): 1.41 cm^2. Valve area (Vmax): 1.46 cm^2. Valve area  (  Vmean): 1.47 cm^2.  ASSESSMENT & PLAN:    Mr Trenton Gammon is very pleasant 65 yo caucasian male with   1) AL Amyloidosis noted on renal biopsy with nephrotic range proteinuria on urinalysis and some lambda free light chains on serum and urinary IFE . On diagnosis:  SPEP with small amount of M-spike 0.3  Echo showed no systolic dysfunction but grade 2 diastolic dysfunction. [facility] cardiac staging 1 as per troponin and BNP criteria. Bone marrow with no overt evidence of multiple myeloma. PET/CT scan showed no evidence of bony lesions or lymphadenopathy . Urine showed about 15 g of protein in 24 hours   Patient is status post CyBorD x 6 cycles He was then on maintenance Velcade and had progression of his AL Amyloidosis.  He was then switched to Ixazomib and Dexamethasone. His UPEP shows about 15 g of proteins per 24 hours but no M spike. SPEP from today shows no M spike. Serum kappa lambda free light chains improving. These show presence of hematologic response.  Creatinine gradually worsening as a function of his proteinuria hypertension and chronic kidney disease.  Plan -  No overt limiting toxicity from Ixazomib. No significant  cytopenias. -Given his acute sinusitis/upper respiratory tract infection will hold his Ixazomib for about a week to 10 days and then shall restart when he feels better  --continue on low-dose aspirin for VTE prophylaxis given his high risk of venous thromboembolism in the setting of nephrotic syndrome.  -continue Acyclovir for shingles prophylaxis. -No clear indication to change his treatment at this time or add additional medications. -Echocardiogram every 6 months.  2) multifactorial anemia - likely multifactorial -from AL amyloidosis, anemia from CKD, chemotherapy. Hgb 8.9.  Lab Results  Component Value Date   FERRITIN 240 08/16/2015   B12 WNL  - we'll check ferritin on next visit and replace as needed to maintain ferritin more than 100 in the setting of CKD. -continue B12 replacement - Aranesp might need to be considered for worsening anemia.  3) CKD -related to hypertension and AL Amyloidosis. Creatinine is in the 2.7 to 3 range and had improved from his previous high of 3.5. Creatinine is now gradually increasing again to 4 4) Feet tingling and numbness ? Related to AL Amyloidosis vs Velcade vs B12 deficiency stable. Started taking gabapentin 12/16/2014. Much improved symptoms. 5)Bilateral pedal edema much improved. Ultrasound of bilateral lower extremity is negative for DVT.  Plan -lasix per PCP/nephrology. -Continue subQ  B12 daily 28 days -Continue close f/u with PCP and nephrology. -Patient's CKD might progress despite hematologic response to treatment.  6) URI/Sinusitis -We'll treated with doxycycline prescription. -The patient has signs of worsening infection/sepsis would need to consider IVIG in the setting of hypogammaglobinemia.  Return to care with Dr. [name] in 4-6 weeks with cbc, cmp and rpt SPEP/QIG and kappa/lambda SFLC  All questions were answered. The patient knows to call the clinic with any problems, questions or concerns. I spent 25 minutes counseling the  patient face to face. The total time spent in the appointment was 25 minutes and more than 50% was on counseling.   Sullivan Lone MD [facility]/Oncology Physician [facility]  (Office):       [phone] (Work cell):  [phone] (Fax):           [phone]

## 2015-08-26 ENCOUNTER — Telehealth: Payer: Self-pay

## 2015-08-26 NOTE — Telephone Encounter (Signed)
Received call from [place] to confirm patient still receiving medication and no income/insurance changes. Provided information to them that no changes in treatment or financial eligibility have occurred and that patient will continue to need their support with medication assistance.  Per representative that is all the information they needed and patient should remain enrolled and receive assistance.  Thank you,  Henreitta Leber, PharmD [facility]

## 2015-09-04 ENCOUNTER — Other Ambulatory Visit: Payer: Self-pay | Admitting: Hematology

## 2015-09-04 DIAGNOSIS — E8581 Light chain (AL) amyloidosis: Secondary | ICD-10-CM

## 2015-09-05 ENCOUNTER — Telehealth: Payer: Self-pay | Admitting: Hematology

## 2015-09-05 NOTE — Telephone Encounter (Signed)
spoke w/ pt confirmed 7/10 apts

## 2015-09-07 ENCOUNTER — Telehealth: Payer: Self-pay | Admitting: Pharmacist

## 2015-09-07 NOTE — Telephone Encounter (Signed)
Oral Chemotherapy Follow-Up Form  Original Start date of oral chemotherapy: 06/21/15  Called patient today to follow up regarding patient's oral chemotherapy medication: Ninlaro Pt took Ninlaro + Dex yesterday (6/27) & is on 2nd week out of 3 weeks of tx and then has a 1 week rest.  Pt had an interruption in his tx (from 6/6 - 6/20) due to upper respiratory tract infection but restarted Ninlaro + Dex last week (6/20).  Pt reports the following side effects:  SOB - pt reports he is SOB easily.  He did just recover from respiratory tract infection.  However, he also has anemia from renal dysfunction.  Last Hgb = 8.7 g/dL on 08/16/15 when here to see Dr. [name].  Pt has another CBC scheduled for this Fri (6/30) when he goes to [facility] for another Procrit injection (per renal MD).  Pt had to take several deep breaths while we spoke over the phone today. Insomnia - due to Dexamethasone.  He notices a "lift up" the evening of his Dex dose (last night) and then struggles to rest during that night.  Later on he "crashes" and has fatigue.  It takes 1-2 days to return to a somewhat baseline.   Nausea - mild.  He has a few episodes of nausea but no vomiting.  He doesn't need antiemetic.  He denies rash. He has RLS and uses Neurontin for that w/ sxs relief.  Pt asked if he could take Dexamethasone slowly during the day of the dosing (ie. 1 tablet/hour or separating his tablets over several hours) so that he could minimize the insomnia/fatigue cycle.  I advised him to take as directed (5 tabs) all at once since that is the most effective dose in clinical trials.  He may discuss this alternative dosing w/ Dr. [name] further at next office visit on 09/19/15.  He has a class at [facility] this Fri (6/30) after his Procrit injection, to learn about dialysis.  Will follow up and call patient again mid-July after he's seen Dr. [name] on 09/19/15.   Thank you, Kennith Center, Pharm.D., CPP 09/07/2015@11 :30 AM Oral  Chemotherapy Clinic

## 2015-09-08 ENCOUNTER — Other Ambulatory Visit (HOSPITAL_COMMUNITY): Payer: Self-pay | Admitting: *Deleted

## 2015-09-09 ENCOUNTER — Ambulatory Visit (HOSPITAL_COMMUNITY)
Admission: RE | Admit: 2015-09-09 | Discharge: 2015-09-09 | Disposition: A | Discharge status: Home / Self Care | Payer: Self-pay | Source: Ambulatory Visit | Attending: Nephrology | Admitting: Nephrology

## 2015-09-09 DIAGNOSIS — D638 Anemia in other chronic diseases classified elsewhere: Secondary | ICD-10-CM | POA: Insufficient documentation

## 2015-09-09 DIAGNOSIS — N183 Chronic kidney disease, stage 3 (moderate): Secondary | ICD-10-CM | POA: Insufficient documentation

## 2015-09-09 LAB — POCT HEMOGLOBIN-HEMACUE: Hemoglobin: 7.7 g/dL — ABNORMAL LOW (ref 13.0–17.0)

## 2015-09-09 MED ORDER — EPOETIN ALFA 20000 UNIT/ML IJ SOLN
20000.0000 [IU] | INTRAMUSCULAR | Status: DC
  Administered 2015-09-09: 20000 [IU] via SUBCUTANEOUS

## 2015-09-09 MED ORDER — EPOETIN ALFA 20000 UNIT/ML IJ SOLN
INTRAMUSCULAR | Status: AC
  Filled 2015-09-09: qty 1

## 2015-09-09 NOTE — Discharge Instructions (Signed)
Epoetin Alfa injection What is this medicine? EPOETIN ALFA (e POE e tin AL fa) helps your body make more red blood cells. This medicine is used to treat anemia caused by chronic kidney failure, cancer chemotherapy, or HIV-therapy. It may also be used before surgery if you have anemia. This medicine may be used for other purposes; ask your health care provider or pharmacist if you have questions. What should I tell my health care provider before I take this medicine? They need to know if you have any of these conditions: -blood clotting disorders -cancer patient not on chemotherapy -cystic fibrosis -heart disease, such as angina or heart failure -hemoglobin level of 12 g/dL or greater -high blood pressure -low levels of folate, iron, or vitamin B12 -seizures -an unusual or allergic reaction to erythropoietin, albumin, benzyl alcohol, hamster proteins, other medicines, foods, dyes, or preservatives -pregnant or trying to get pregnant -breast-feeding How should I use this medicine? This medicine is for injection into a vein or under the skin. It is usually given by a health care professional in a hospital or clinic setting. If you get this medicine at home, you will be taught how to prepare and give this medicine. Use exactly as directed. Take your medicine at regular intervals. Do not take your medicine more often than directed. It is important that you put your used needles and syringes in a special sharps container. Do not put them in a trash can. If you do not have a sharps container, call your pharmacist or healthcare provider to get one. Talk to your pediatrician regarding the use of this medicine in children. While this drug may be prescribed for selected conditions, precautions do apply. Overdosage: If you think you have taken too much of this medicine contact a poison control center or emergency room at once. NOTE: This medicine is only for you. Do not share this medicine with  others. What if I miss a dose? If you miss a dose, take it as soon as you can. If it is almost time for your next dose, take only that dose. Do not take double or extra doses. What may interact with this medicine? Do not take this medicine with any of the following medications: -darbepoetin alfa This list may not describe all possible interactions. Give your health care provider a list of all the medicines, herbs, non-prescription drugs, or dietary supplements you use. Also tell them if you smoke, drink alcohol, or use illegal drugs. Some items may interact with your medicine. What should I watch for while using this medicine? Visit your prescriber or health care professional for regular checks on your progress and for the needed blood tests and blood pressure measurements. It is especially important for the doctor to make sure your hemoglobin level is in the desired range, to limit the risk of potential side effects and to give you the best benefit. Keep all appointments for any recommended tests. Check your blood pressure as directed. Ask your doctor what your blood pressure should be and when you should contact him or her. As your body makes more red blood cells, you may need to take iron, folic acid, or vitamin B supplements. Ask your doctor or health care provider which products are right for you. If you have kidney disease continue dietary restrictions, even though this medication can make you feel better. Talk with your doctor or health care professional about the foods you eat and the vitamins that you take. What side effects may I notice   from receiving this medicine? Side effects that you should report to your doctor or health care professional as soon as possible: -allergic reactions like skin rash, itching or hives, swelling of the face, lips, or tongue -breathing problems -changes in vision -chest pain -confusion, trouble speaking or understanding -feeling faint or lightheaded,  falls -high blood pressure -muscle aches or pains -pain, swelling, warmth in the leg -rapid weight gain -severe headaches -sudden numbness or weakness of the face, arm or leg -trouble walking, dizziness, loss of balance or coordination -seizures (convulsions) -swelling of the ankles, feet, hands -unusually weak or tired Side effects that usually do not require medical attention (report to your doctor or health care professional if they continue or are bothersome): -diarrhea -fever, chills (flu-like symptoms) -headaches -nausea, vomiting -redness, stinging, or swelling at site where injected This list may not describe all possible side effects. Call your doctor for medical advice about side effects. You may report side effects to FDA at 1-800-FDA-1088. Where should I keep my medicine? Keep out of the reach of children. Store in a refrigerator between 2 and 8 degrees C (36 and 46 degrees F). Do not freeze or shake. Throw away any unused portion if using a single-dose vial. Multi-dose vials can be kept in the refrigerator for up to 21 days after the initial dose. Throw away unused medicine. NOTE: This sheet is a summary. It may not cover all possible information. If you have questions about this medicine, talk to your doctor, pharmacist, or health care provider.    2016, Elsevier/Gold Standard. (2008-02-10 10:25:44)  

## 2015-09-14 ENCOUNTER — Telehealth: Payer: Self-pay | Admitting: Pharmacist

## 2015-09-14 NOTE — Telephone Encounter (Addendum)
Received VM from [facility] regarding Pt ID: [id number]. Phone: [phone]. I returned the call.  They did  benefits analysis that showed pt was eligible for Medicaid and wanted to know if he had applied. They will contact pt to determine if this was done - it is part of their process to call us first to see if anything is noted.  They wanted to confirm that patient was still requiring Ninlaro therapy - I said yes. Pt did have treatment interruption in June due to an upper respiratory infection. Ninlaro spokesperson said they would need to report that infection to the manufacturer (or if I wanted to I could). I declined and said that would be up to them.  No other questions from Ninlaro [facility]-Point at this time.  Raul Del, PharmD, [place], [facility] [phone]

## 2015-09-19 ENCOUNTER — Other Ambulatory Visit (HOSPITAL_BASED_OUTPATIENT_CLINIC_OR_DEPARTMENT_OTHER): Payer: Self-pay

## 2015-09-19 ENCOUNTER — Other Ambulatory Visit: Payer: Self-pay | Admitting: *Deleted

## 2015-09-19 ENCOUNTER — Ambulatory Visit (HOSPITAL_COMMUNITY)
Admission: RE | Admit: 2015-09-19 | Discharge: 2015-09-19 | Disposition: A | Discharge status: Home / Self Care | Payer: Self-pay | Source: Ambulatory Visit | Attending: Hematology | Admitting: Hematology

## 2015-09-19 ENCOUNTER — Ambulatory Visit (HOSPITAL_BASED_OUTPATIENT_CLINIC_OR_DEPARTMENT_OTHER): Payer: Self-pay | Admitting: Hematology

## 2015-09-19 ENCOUNTER — Telehealth: Payer: Self-pay | Admitting: Hematology

## 2015-09-19 VITALS — BP 167/55 | HR 83 | Temp 98.1°F | Resp 18 | Ht 69.0 in | Wt 195.1 lb

## 2015-09-19 VITALS — BP 156/55 | HR 71 | Temp 97.8°F | Resp 16

## 2015-09-19 DIAGNOSIS — D696 Thrombocytopenia, unspecified: Secondary | ICD-10-CM

## 2015-09-19 DIAGNOSIS — D649 Anemia, unspecified: Secondary | ICD-10-CM

## 2015-09-19 DIAGNOSIS — C9000 Multiple myeloma not having achieved remission: Secondary | ICD-10-CM

## 2015-09-19 DIAGNOSIS — I129 Hypertensive chronic kidney disease with stage 1 through stage 4 chronic kidney disease, or unspecified chronic kidney disease: Secondary | ICD-10-CM

## 2015-09-19 DIAGNOSIS — E858 Other amyloidosis: Secondary | ICD-10-CM

## 2015-09-19 DIAGNOSIS — N189 Chronic kidney disease, unspecified: Secondary | ICD-10-CM

## 2015-09-19 DIAGNOSIS — E8581 Light chain (AL) amyloidosis: Secondary | ICD-10-CM

## 2015-09-19 DIAGNOSIS — R609 Edema, unspecified: Secondary | ICD-10-CM

## 2015-09-19 DIAGNOSIS — R809 Proteinuria, unspecified: Secondary | ICD-10-CM

## 2015-09-19 DIAGNOSIS — D631 Anemia in chronic kidney disease: Secondary | ICD-10-CM

## 2015-09-19 LAB — CBC & DIFF AND RETIC
BASO%: 0.4 % (ref 0.0–2.0)
Basophils Absolute: 0 10*3/uL (ref 0.0–0.1)
EOS%: 2.4 % (ref 0.0–7.0)
Eosinophils Absolute: 0.2 10*3/uL (ref 0.0–0.5)
HCT: 22.3 % — ABNORMAL LOW (ref 38.4–49.9)
HGB: 7.4 g/dL — ABNORMAL LOW (ref 13.0–17.1)
Immature Retic Fract: 9.1 % (ref 3.00–10.60)
LYMPH#: 0.4 10*3/uL — AB (ref 0.9–3.3)
LYMPH%: 6.2 % — AB (ref 14.0–49.0)
MCH: 29.4 pg (ref 27.2–33.4)
MCHC: 32.9 g/dL (ref 32.0–36.0)
MCV: 89.4 fL (ref 79.3–98.0)
MONO#: 0.7 10*3/uL (ref 0.1–0.9)
MONO%: 10.4 % (ref 0.0–14.0)
NEUT%: 80.6 % — AB (ref 39.0–75.0)
NEUTROS ABS: 5.4 10*3/uL (ref 1.5–6.5)
Platelets: 79 10*3/uL — ABNORMAL LOW (ref 140–400)
RBC: 2.5 10*6/uL — AB (ref 4.20–5.82)
RDW: 16.7 % — ABNORMAL HIGH (ref 11.0–14.6)
RETIC %: 4.48 % — AB (ref 0.80–1.80)
Retic Ct Abs: 112 10*3/uL — ABNORMAL HIGH (ref 34.80–93.90)
WBC: 6.6 10*3/uL (ref 4.0–10.3)
nRBC: 0 % (ref 0–0)

## 2015-09-19 LAB — TECHNOLOGIST REVIEW

## 2015-09-19 LAB — PREPARE RBC (CROSSMATCH)

## 2015-09-19 LAB — COMPREHENSIVE METABOLIC PANEL
ALT: 11 U/L (ref 0–55)
AST: 11 U/L (ref 5–34)
Albumin: 2.9 g/dL — ABNORMAL LOW (ref 3.5–5.0)
Alkaline Phosphatase: 60 U/L (ref 40–150)
Anion Gap: 13 mEq/L — ABNORMAL HIGH (ref 3–11)
BUN: 63.1 mg/dL — ABNORMAL HIGH (ref 7.0–26.0)
CO2: 16 meq/L — AB (ref 22–29)
Calcium: 8.2 mg/dL — ABNORMAL LOW (ref 8.4–10.4)
Chloride: 111 mEq/L — ABNORMAL HIGH (ref 98–109)
Creatinine: 3.8 mg/dL (ref 0.7–1.3)
EGFR: 16 mL/min/{1.73_m2} — AB (ref >90)
GLUCOSE: 126 mg/dL (ref 70–140)
POTASSIUM: 3.6 meq/L (ref 3.5–5.1)
SODIUM: 140 meq/L (ref 136–145)
Total Bilirubin: 0.44 mg/dL (ref 0.20–1.20)
Total Protein: 6.1 g/dL — ABNORMAL LOW (ref 6.4–8.3)

## 2015-09-19 MED ORDER — HEPARIN SOD (PORK) LOCK FLUSH 100 UNIT/ML IV SOLN: 500.0000 [IU] | Freq: Every day | INTRAVENOUS | Status: DC | PRN

## 2015-09-19 MED ORDER — ACETAMINOPHEN 325 MG PO TABS
650.0000 mg | ORAL_TABLET | Freq: Once | ORAL | Status: AC
  Administered 2015-09-19: 650 mg via ORAL
  Filled 2015-09-19: qty 2

## 2015-09-19 MED ORDER — SODIUM CHLORIDE 0.9 % IV SOLN: 250.0000 mL | Freq: Once | INTRAVENOUS | Status: AC

## 2015-09-19 MED ORDER — SODIUM CHLORIDE 0.9% FLUSH: 3.0000 mL | INTRAVENOUS | Status: DC | PRN

## 2015-09-19 MED ORDER — FUROSEMIDE 10 MG/ML IJ SOLN
20.0000 mg | Freq: Once | INTRAMUSCULAR | Status: AC
  Administered 2015-09-19: 20 mg via INTRAVENOUS
  Filled 2015-09-19: qty 2

## 2015-09-19 MED ORDER — DIPHENHYDRAMINE HCL 25 MG PO CAPS
25.0000 mg | ORAL_CAPSULE | Freq: Once | ORAL | Status: AC
  Administered 2015-09-19: 25 mg via ORAL
  Filled 2015-09-19: qty 1

## 2015-09-19 MED ORDER — SODIUM CHLORIDE 0.9 % IV SOLN
250.0000 mL | Freq: Once | INTRAVENOUS | Status: AC
  Administered 2015-09-19: 250 mL via INTRAVENOUS

## 2015-09-19 MED ORDER — GABAPENTIN 250 MG/5ML PO SOLN: 300.0000 mg | Freq: Every day | ORAL | Status: DC

## 2015-09-19 MED ORDER — HEPARIN SOD (PORK) LOCK FLUSH 100 UNIT/ML IV SOLN: 250.0000 [IU] | INTRAVENOUS | Status: DC | PRN

## 2015-09-19 MED ORDER — SODIUM CHLORIDE 0.9% FLUSH: 10.0000 mL | INTRAVENOUS | Status: DC | PRN

## 2015-09-19 NOTE — Progress Notes (Signed)
Patient ID: Jose Garrett, male   DOB: May 21, 1950, 65 y.o.   MRN: [id number] Medical Provider: [name], MD  Associated Diagnosis: Symptomatic Anemia  Procedure: 1 unit PRBC transfusion.   Patient tolerated transfusion well. Went over discharge instructions and copy given to patient. Alert, oriented and ambulatory at time of discharge.

## 2015-09-19 NOTE — Discharge Instructions (Signed)
Blood Transfusion, Care After °Refer to this sheet in the next few weeks. These instructions provide you with information about caring for yourself after your procedure. Your health care provider may also give you more specific instructions. Your treatment has been planned according to current medical practices, but problems sometimes occur. Call your health care provider if you have any problems or questions after your procedure. °WHAT TO EXPECT AFTER THE PROCEDURE °After your procedure, it is common to have: °· Bruising and soreness at the IV site. °· Chills or fever. °· Headache. °HOME CARE INSTRUCTIONS °· Take medicines only as directed by your health care provider. Ask your health care provider if you can take an over-the-counter pain reliever in case you have a fever or headache a day or two after your transfusion. °· Return to your normal activities as directed by your health care provider. °SEEK MEDICAL CARE IF:  °· You develop redness or irritation at your IV site. °· You have persistent fever, chills, or headache. °· Your urine is darker than normal. °· Your urine turns pink, red, or brown.   °· The white part of your eye turns yellow (jaundice).   °· You feel weak after doing your normal activities.   °SEEK IMMEDIATE MEDICAL CARE IF:  °· You have trouble breathing. °· You have fever and chills along with: °· Anxiety. °· Chest or back pain. °· Flushed skin. °· Clammy skin. °· A rapid heartbeat. °· Nausea. °  °This information is not intended to replace advice given to you by your health care provider. Make sure you discuss any questions you have with your health care provider. °  °Document Released: 03/19/2014 Document Reviewed: 03/19/2014 °Elsevier Interactive Patient Education ©2016 Elsevier Inc. ° °Blood Transfusion  °A blood transfusion is a procedure in which you receive donated blood through an IV tube. You may need a blood transfusion because of illness, surgery, or injury. The blood may come from a  donor, or it may be your own blood that you donated previously. °The blood given in a transfusion is made up of different types of cells. You may receive: °· Red blood cells. These carry oxygen and replace lost blood. °· Platelets. These control bleeding. °· Plasma. This helps blood to clot. °If you have hemophilia or another clotting disorder, you may also receive other types of blood products. °LET YOUR HEALTH CARE PROVIDER KNOW ABOUT: °· Any allergies you have. °· All medicines you are taking, including vitamins, herbs, eye drops, creams, and over-the-counter medicines. °· Previous problems you or members of your family have had with the use of anesthetics. °· Any blood disorders you have. °· Previous surgeries you have had. °· Any medical conditions you may have. °· Any previous reactions you have had during a blood transfusion.   °RISKS AND COMPLICATIONS °Generally, this is a safe procedure. However, problems may occur, including: °· Having an allergic reaction to something in the donated blood. °· Fever. This may be a reaction to the white blood cells in the transfused blood. °· Iron overload. This can happen from having many transfusions. °· Transfusion-related acute lung injury (TRALI). This is a rare reaction that causes lung damage. The cause is not known. TRALI can occur within hours of a transfusion or several days later. °· Sudden (acute) or delayed hemolytic reactions. This happens if your blood does not match the cells in your transfusion. Your body's defense system (immune system) may try to attack the new cells. This complication is rare. °· Infection. This is rare. °BEFORE THE   PROCEDURE °· You may have a blood test to determine your blood type. This is necessary to know what kind of blood your body will accept. °· If you are going to have a planned surgery, you may donate your own blood. This may be done in case you need to have a transfusion. °· If you have had an allergic reaction to a  transfusion in the past, you may be given medicine to help prevent a reaction. Take this medicine only as directed by your health care provider. °· You will have your temperature, blood pressure, and pulse monitored before the transfusion. °PROCEDURE  °· An IV will be started in your hand or arm. °· The bag of donated blood will be attached to your IV tube and given into your vein. °· Your temperature, blood pressure, and pulse will be monitored regularly during the transfusion. This monitoring is done to detect early signs of a transfusion reaction. °· If you have any signs or symptoms of a reaction, your transfusion will be stopped and you may be given medicine. °· When the transfusion is over, your IV will be removed. °· Pressure may be applied to the IV site for a few minutes. °· A bandage (dressing) will be applied. °The procedure may vary among health care providers and hospitals. °AFTER THE PROCEDURE °· Your blood pressure, temperature, and pulse will be monitored regularly. °  °This information is not intended to replace advice given to you by your health care provider. Make sure you discuss any questions you have with your health care provider. °  °Document Released: 02/24/2000 Document Revised: 03/19/2014 Document Reviewed: 01/06/2014 °Elsevier Interactive Patient Education ©2016 Elsevier Inc. ° °

## 2015-09-19 NOTE — Telephone Encounter (Signed)
Gave patient avs report and appointments for July. Patient having one unit PRBC's at [facility] today (on schedule). Per desk nurse the two drugs listed under chemo info on 7/10 pof are oral drugs - nothing to schedule.

## 2015-09-20 ENCOUNTER — Other Ambulatory Visit: Payer: Self-pay | Admitting: *Deleted

## 2015-09-20 ENCOUNTER — Encounter: Payer: Self-pay | Admitting: *Deleted

## 2015-09-20 DIAGNOSIS — E8581 Light chain (AL) amyloidosis: Secondary | ICD-10-CM

## 2015-09-20 LAB — KAPPA/LAMBDA LIGHT CHAINS
Ig Kappa Free Light Chain: 30.7 mg/L — ABNORMAL HIGH (ref 3.3–19.4)
Ig Lambda Free Light Chain: 27.3 mg/L — ABNORMAL HIGH (ref 5.7–26.3)
Kappa/Lambda FluidC Ratio: 1.12 (ref 0.26–1.65)

## 2015-09-20 LAB — TYPE AND SCREEN
ABO/RH(D): O POS
ANTIBODY SCREEN: NEGATIVE
Unit division: 0

## 2015-09-20 MED ORDER — IXAZOMIB CITRATE 3 MG PO CAPS: 3.0000 mg | ORAL_CAPSULE | ORAL | Status: DC

## 2015-09-20 MED ORDER — DEXAMETHASONE 4 MG PO TABS: ORAL_TABLET | ORAL | Status: DC

## 2015-09-20 NOTE — Progress Notes (Signed)
.    Hematology oncology clinic note  Date of service  .09/20/2015  Patient Care Team: [name], MD as PCP - General (Family Medicine) [name], MD as PCP - Hematology/Oncology (Hematology and Oncology) [name], MD as Consulting Physician (Nephrology)  CHIEF COMPLAINTS: followup for AL amyloidosis.  HISTORY OF PRESENTING ILLNESS: Please see my initial consultation for details on presentation.  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 AL amyloidosis noted on kidney biopsy.  Had about 15 g of protein per 24 hours which produced a less than 50% suggesting renal response to treatment. - no overt heart failure   Current treatment Ixazomib + Dexamethasone s/p 3 cycles   previous treatment  CyBorD x 6 cycles Evaluated for and declined Auto HSCT at [facility]. - maintenance Velcade  INTERVAL HISTORY  [name] is here for his followup prior to 4th cycle of Ixazomib and dexamethasone.  His upper respiratory tract infection resolved with previous antibiotics. He just completed his third cycle of Ixazomib and Dexamethasone and is in his period of rest. He is seeing Dr. Ballard Russell for his nephrology cares and reports that he was started on Procrit 2 weeks ago for anemia related to his chronic kidney disease. Report of his blood pressures have been reasonably controlled. He reports increasing fatigue and his hemoglobin today is 7.4 and is agreeable for PRBC transfusion today. He is scheduled to continue Procrit every 2 weeks.  Notes no rashes. Other than fatigue due to his anemia he notes that he is feeling well. No overt bleeding noted. Platelet count somewhat low in the 70k range related to his Ixazomib and he is currently in his week of rest psoas platelet should improve prior to his next cycle.  MEDICAL HISTORY:  Past Medical History  Diagnosis Date  . Hypertension Dx 2012  .  Hyperlipidemia Dx 2012  . History of blood transfusion 09/08/2014    "got hematoma after renal biopsy & HgB dropped"  . Anemia Dx 2016  . Family history of adverse reaction to anesthesia     "my daughter can't take certain anesthesia agents" (09/08/2014)  . Enlarged heart 2015  . Chronic diastolic CHF (congestive heart failure), NYHA class 1 (HCC)     /hospital problem list 09/08/2014  . Renal disorder     "there is a spot on my kidney" (09/08/2014)  . Chronic renal disease, stage III     /hospital problem list 09/08/2014    SURGICAL HISTORY: Past Surgical History  Procedure Laterality Date  . Renal biopsy, percutaneous Right 09/08/2014  . Tonsillectomy  ~ 1960  . Laparoscopic cholecystectomy  03/2011    SOCIAL HISTORY: Social History   Social History  . Marital Status: Divorced    Spouse Name: N/A  . Number of Children: N/A  . Years of Education: N/A   Occupational History  . Not on file.   Social History Main Topics  . Smoking status: Former Smoker -- 2.00 packs/day for 40 years    Types: Cigarettes    Quit date: 03/19/2011  . Smokeless tobacco: Never Used  . Alcohol Use: No  . Drug Use: No     Comment: 09/08/2014 "I have used marijuana till the 1990's". Notes that he quit 15 yrs ago  . Sexual Activity: Not Currently   Other Topics Concern  . Not on file   Social History Narrative   Previously worked Education officer, [employer]. Lives with  his daughter and her finance.  FAMILY HISTORY: Family History  Problem Relation Age of Onset  . Hypertension Mother   . Diabetes Mother   . Heart disease Mother   . Skin cancer Mother   . Hypertension Father   . Diabetes Father   . Diabetes Sister     ALLERGIES:  has No Known Allergies.  MEDICATIONS:  Current Outpatient Prescriptions  Medication Sig Dispense Refill  . acyclovir (ZOVIRAX) 400 MG tablet TAKE 1 TABLET BY MOUTH DAILY. (DOSE ADJUSTED FOR RENAL FUNCTION) 60 tablet 3  . amLODipine  (NORVASC) 10 MG tablet Take 1 tablet (10 mg total) by mouth daily. 30 tablet 5  . aspirin EC 81 MG tablet Take 1 tablet (81 mg total) by mouth daily. 30 tablet 11  . benzonatate (TESSALON) 100 MG capsule Take 2 capsules (200 mg total) by mouth 2 (two) times daily as needed for cough. 20 capsule 0  . carvedilol (COREG) 25 MG tablet Take 1 tablet (25 mg total) by mouth 2 (two) times daily with a meal. 60 tablet 5  . cetirizine (ZYRTEC) 5 MG tablet Take 1 tablet (5 mg total) by mouth daily. 30 tablet 5  . Cyanocobalamin (B-12) 1000 MCG SUBL Place 1,000 mcg under the tongue daily. 30 each 3  . dexamethasone (DECADRON) 4 MG tablet Take 5 tablets (20 mg total) by mouth once every week. 15 tablet 1  . doxycycline (VIBRA-TABS) 100 MG tablet Take 1 tablet (100 mg total) by mouth 2 (two) times daily. 14 tablet 0  . FLONASE 50 MCG/ACT nasal spray Place 1 spray into both nostrils daily. (Patient not taking: Reported on 08/22/2015) 15.8 g 0  . furosemide (LASIX) 40 MG tablet Take 1 tablet (40 mg total) by mouth 3 (three) times daily. 90 tablet 5  . gabapentin (NEURONTIN) 250 MG/5ML solution Take 6 mLs (300 mg total) by mouth at bedtime. May increased to '400mg'$  po HS if still having neuropathic pain 240 mL 1  . guaiFENesin-codeine 100-10 MG/5ML syrup Take 5-10 mLs by mouth every 6 (six) hours as needed for cough. 120 mL 0  . hydrALAZINE (APRESOLINE) 100 MG tablet Take 1 tablet (100 mg total) by mouth 3 (three) times daily. 90 tablet 5  . ixazomib citrate (NINLARO) 3 MG capsule Take 1 capsule (3 mg total) by mouth once a week. Take on an empty stomach 1hr before or 2hrs after food. Do not crush, chew, or open. 6 capsule 1  . LORazepam (ATIVAN) 1 MG tablet Take 1 tablet (1 mg total) by mouth every 8 (eight) hours as needed for anxiety (or nausea). 30 tablet 0  . ondansetron (ZOFRAN) 8 MG tablet Take 8 mg by mouth every 8 (eight) hours as needed for nausea or vomiting. Reported on 07/08/2015    . oxyCODONE (OXY  IR/ROXICODONE) 5 MG immediate release tablet Take 1 tablet (5 mg total) by mouth every 4 (four) hours as needed for severe pain. (Patient not taking: Reported on 08/22/2015) 60 tablet 0  . vitamin E 100 UNIT capsule Take 100 Units by mouth every other day. Reported on 07/08/2015     No current facility-administered medications for this visit.    REVIEW OF SYSTEMS:   Constitutional: Denies fevers, chills or abnormal night sweats Eyes: Denies blurriness of vision, double vision or watery eyes Ears, nose, mouth, throat, and face: Denies mucositis or sore throat Respiratory: Denies cough, dyspnea or wheezes Cardiovascular: Denies palpitation, chest discomfort or lower extremity swelling Gastrointestinal:  Denies nausea, heartburn  or change in bowel habits Skin: Denies abnormal skin rashes Lymphatics: Denies new lymphadenopathy or easy bruising Neurological:Denies numbness, tingling or new weaknesses Behavioral/Psych: Mood is stable, no new changes  All other systems were reviewed with the patient and are negative.   PHYSICAL EXAMINATION: ECOG PERFORMANCE STATUS: 2 - Symptomatic, <50% confined to bed  Filed Vitals:   09/19/15 0935  BP: 167/55  Pulse: 83  Temp: 98.1 F (36.7 C)  Resp: 18   Filed Weights   09/19/15 0935  Weight: 195 lb 1.6 oz (88.497 kg)    GENERAL:alert, Mild this is due to sinus congestion and sore throat and cough. SKIN: significant conjunctival pallor noted.  EYES: nl EOM, PERL OROPHARYNX: Sinus congestion with mild sinus tenderness, erythematous pharynx with no overt exudates. No cervical adenopathy.  NECK: no JVD LYMPH:  no palpable lymphadenopathy in the cervical, axillary or inguinal LUNGS: clear to auscultation and percussion with normal breathing effort HEART: regular rate & rhythm and 2/6 SM aortic area ABDOMEN:abdomen soft, non-tender and normal bowel sounds Musculoskeletal: B/l trace pitting pedal edema No Tenderness. No redness.  PSYCH: alert &  oriented x 3 with fluent speech NEURO: no focal motor/sensory deficits  LABORATORY DATA:   . CBC Latest Ref Rng 09/19/2015 09/09/2015 08/16/2015  WBC 4.0 - 10.3 10e3/uL 6.6 - 9.3  Hemoglobin 13.0 - 17.1 g/dL 7.4(L) 7.7(L) 8.7(L)  Hematocrit 38.4 - 49.9 % 22.3(L) - 25.6(L)  Platelets 140 - 400 10e3/uL 79(L) - 132(L)   . CMP Latest Ref Rng 09/19/2015 08/16/2015 08/16/2015  Glucose 70 - 140 mg/dl 126 109 -  BUN 7.0 - 26.0 mg/dL 63.1(H) 47.5(H) -  Creatinine 0.7 - 1.3 mg/dL 3.8(HH) 4.0(HH) -  Sodium 136 - 145 mEq/L 140 134(L) -  Potassium 3.5 - 5.1 mEq/L 3.6 3.6 -  CO2 22 - 29 mEq/L 16(L) 16(L) -  Calcium 8.4 - 10.4 mg/dL 8.2(L) 9.0 -  Total Protein 6.4 - 8.3 g/dL 6.1(L) 6.7 5.7(L)  Total Bilirubin 0.20 - 1.20 mg/dL 0.44 0.34 -  Alkaline Phos 40 - 150 U/L 60 69 -  AST 5 - 34 U/L 11 27 -  ALT 0 - 55 U/L 11 16 -          RADIOGRAPHIC STUDIES: I have personally reviewed the radiological images as listed and agreed with the findings in the report.  .No results found.   Echocardiogram 06/10/2015:   [facility]*  *[place] [employer]  [place], [place] [address]  [phone]  ------------------------------------------------------------------- Transthoracic Echocardiography  Patient: Andrew, Blasius MR #: [id number] Study Date: 06/10/2015 Gender: M Age: 27 Height: 172.7 cm Weight: 88 kg BSA: 2.08 m^2 Pt. Status: Room:  SONOGRAPHER [place], Outpatient ATTENDING Sullivan Lone [facility] Sullivan Lone Kishore  cc:  ------------------------------------------------------------------- LV EF: 60% - 65%  ------------------------------------------------------------------- History: PMH: Former Smoker, Renal Hematoma, Anemia. Amyloidosis. Risk factors:  Hypertension.  ------------------------------------------------------------------- Study Conclusions  - Left ventricle: Global longitudinal LV strain is normal at -19.8%  The cavity size was normal. There was moderate concentric  hypertrophy. Systolic function was normal. The estimated ejection  fraction was in the range of 60% to 65%. Wall motion was normal;  there were no regional wall motion abnormalities. Features are  consistent with a pseudonormal left ventricular filling pattern,  with concomitant abnormal relaxation and increased filling  pressure (grade 2 diastolic dysfunction). Doppler parameters are  consistent with high ventricular filling pressure. - Aortic valve: Severe diffuse thickening and calcification. There  was very mild stenosis. There was mild regurgitation. Valve area  (VTI): 1.41 cm^2. Valve area (Vmax): 1.46 cm^2. Valve area  (Vmean): 1.47 cm^2.  ASSESSMENT & PLAN:    Mr Jose Garrett is very pleasant 65 yo caucasian male with   1) AL Amyloidosis noted on renal biopsy with nephrotic range proteinuria on urinalysis and some lambda free light chains on serum and urinary IFE . On diagnosis:  SPEP with small amount of M-spike 0.3  Echo showed no systolic dysfunction but grade 2 diastolic dysfunction. [facility] cardiac staging 1 as per troponin and BNP criteria. Bone marrow with no overt evidence of multiple myeloma. PET/CT scan showed no evidence of bony lesions or lymphadenopathy . Urine showed about 15 g of protein in 24 hours   Patient is status post CyBorD x 6 cycles He was then on maintenance Velcade and had progression of his AL Amyloidosis.  He was then switched to Ixazomib and Dexamethasone and has completed 3 cycles without significant toxicities. His UPEP shows about 15 g of proteins per 24 hours but no M spike. SPEP from 08/16/2015 shows no M spike. Serum kappa lambda free light chains improving.   2) Thrombocytopenia (79K) likely from  the Ixazomib. Anticipate this will improve the next week prior to his next cycle. Patient is already on renally adjusted dose of the medication.  Creatinine gradually worsening as a function of his proteinuria hypertension and chronic kidney disease. I'm certain that further accentuation of treatment will help change this course at this time  Plan -- No overt limiting toxicity from Ixazomib.  --continue on low-dose aspirin for VTE prophylaxis given his high risk of venous thromboembolism in the setting of nephrotic syndrome.  -continue Acyclovir for shingles prophylaxis. -No clear indication to change his treatment at this time or add additional medications. -Echocardiogram every 6 months. -next cycle of Ixazomib starting in about 1 week. Patient is already on renally adjusted dose of this medication. If he were to require dialysis the dose will remain the same that would be administered after dialysis on the day of administration. -SPEP pending from today.  2) multifactorial anemia - likely multifactorial -from AL amyloidosis, anemia from CKD. His drop in hemoglobin down to 7.4 predominantly to flex his significant end-stage renal disease   Lab Results  Component Value Date   FERRITIN 240 08/16/2015   B12 WNL -Previously deficient   - will transfuse 1 unit of PRBC for symptomatically anemic today. -Last ferritin levels were more than 100 and reasonable. -Patient has been started on Procrit as per nephrology and we should allow them to manage ESA therapy and HTN -continue B12 replacement   3) CKD -related to hypertension and AL Amyloidosis. Creatinine is in the 2.7 to 3 range and had improved from his previous high of 3.5. Creatinine itoay down from 4 to 3.8  4) Feet tingling and numbness ? Related to AL Amyloidosis vs Velcade vs B12 deficiency stable. Started taking gabapentin 12/16/2014. Much improved symptoms. 5)Bilateral pedal edema much improved. Ultrasound of bilateral lower  extremity is negative for DVT.  Plan -lasix per PCP/nephrology. -Continue SL B12 -Continue close f/u with PCP and nephrology (Dr [name]). -Patient's CKD might progress despite hematologic response to treatment.  6) URI/Sinusitis - resovled with doxycycline -The patient has signs of worsening infection/sepsis would need to consider IVIG in the setting of hypogammaglobinemia.  Return to care with Dr. [name] in 2 weeks with cbc, cmp to monitor blood counts  All questions were answered. The  patient knows to call the clinic with any problems, questions or concerns. I spent 25 minutes counseling the patient face to face. The total time spent in the appointment was 30 minutes and more than 50% was on counseling.   Sullivan Lone MD [place] Hematology/Oncology Physician [facility]  (Office):       [phone] (Work cell):  [phone] (Fax):           [phone]

## 2015-09-20 NOTE — Progress Notes (Unsigned)
Patient was enrolled with 1 point assistance for his Ninlaro for 90 days. Patient enrollment has currently ended. For patient to continue with this assistance patient needs to provide Medicaid status information (approved/pending/not approved) asap so that medication can be given. Patient has spoken with representative and should be giving them the information needed asap.

## 2015-09-21 LAB — MULTIPLE MYELOMA PANEL, SERUM
ALBUMIN/GLOB SERPL: 1.3 (ref 0.7–1.7)
ALPHA 1: 0.3 g/dL (ref 0.0–0.4)
ALPHA2 GLOB SERPL ELPH-MCNC: 1 g/dL (ref 0.4–1.0)
Albumin SerPl Elph-Mcnc: 2.9 g/dL (ref 2.9–4.4)
B-Globulin SerPl Elph-Mcnc: 0.8 g/dL (ref 0.7–1.3)
GAMMA GLOB SERPL ELPH-MCNC: 0.3 g/dL — AB (ref 0.4–1.8)
GLOBULIN, TOTAL: 2.4 g/dL (ref 2.2–3.9)
IGA/IMMUNOGLOBULIN A, SERUM: 65 mg/dL (ref 61–437)
IgG, Qn, Serum: 288 mg/dL — ABNORMAL LOW (ref 700–1600)
IgM, Qn, Serum: 72 mg/dL (ref 20–172)
Total Protein: 5.3 g/dL — ABNORMAL LOW (ref 6.0–8.5)

## 2015-09-22 ENCOUNTER — Other Ambulatory Visit (HOSPITAL_COMMUNITY): Payer: Self-pay | Admitting: *Deleted

## 2015-09-23 ENCOUNTER — Encounter (HOSPITAL_COMMUNITY)
Admission: RE | Admit: 2015-09-23 | Discharge: 2015-09-23 | Disposition: A | Discharge status: Home / Self Care | Payer: Self-pay | Source: Ambulatory Visit | Attending: Nephrology | Admitting: Nephrology

## 2015-09-23 DIAGNOSIS — D638 Anemia in other chronic diseases classified elsewhere: Secondary | ICD-10-CM | POA: Insufficient documentation

## 2015-09-23 DIAGNOSIS — N184 Chronic kidney disease, stage 4 (severe): Secondary | ICD-10-CM | POA: Insufficient documentation

## 2015-09-23 LAB — POCT HEMOGLOBIN-HEMACUE: Hemoglobin: 8.1 g/dL — ABNORMAL LOW (ref 13.0–17.0)

## 2015-09-23 MED ORDER — EPOETIN ALFA 20000 UNIT/ML IJ SOLN
INTRAMUSCULAR | Status: AC
  Filled 2015-09-23: qty 1

## 2015-09-23 MED ORDER — EPOETIN ALFA 20000 UNIT/ML IJ SOLN
20000.0000 [IU] | INTRAMUSCULAR | Status: DC
  Administered 2015-09-23: 20000 [IU] via SUBCUTANEOUS

## 2015-09-28 ENCOUNTER — Telehealth: Payer: Self-pay | Admitting: Pharmacist

## 2015-09-28 NOTE — Telephone Encounter (Signed)
Received call from rep at [facility]. Pt contacted them on 09/22/15 and stated he has applied for Medicaid and his case is under review.  It should take 10-12 days for decision. [employer] let us know today that they shipped another 1 month supply of Ninlaro to pt. If pts Medicaid is denied, he will still be eligible for support w/ [employer]. Kennith Center, Pharm.D., CPP 09/28/2015@3 :[facility]

## 2015-10-03 ENCOUNTER — Other Ambulatory Visit (HOSPITAL_BASED_OUTPATIENT_CLINIC_OR_DEPARTMENT_OTHER): Payer: Self-pay

## 2015-10-03 ENCOUNTER — Encounter: Payer: Self-pay | Admitting: Hematology

## 2015-10-03 ENCOUNTER — Telehealth: Payer: Self-pay | Admitting: Hematology

## 2015-10-03 ENCOUNTER — Ambulatory Visit (HOSPITAL_BASED_OUTPATIENT_CLINIC_OR_DEPARTMENT_OTHER): Payer: Self-pay | Admitting: Hematology

## 2015-10-03 VITALS — BP 154/49 | HR 75 | Temp 98.0°F | Resp 18 | Ht 69.0 in | Wt 196.1 lb

## 2015-10-03 DIAGNOSIS — E858 Other amyloidosis: Secondary | ICD-10-CM

## 2015-10-03 DIAGNOSIS — I129 Hypertensive chronic kidney disease with stage 1 through stage 4 chronic kidney disease, or unspecified chronic kidney disease: Secondary | ICD-10-CM

## 2015-10-03 DIAGNOSIS — E539 Vitamin B deficiency, unspecified: Secondary | ICD-10-CM

## 2015-10-03 DIAGNOSIS — E8581 Light chain (AL) amyloidosis: Secondary | ICD-10-CM

## 2015-10-03 DIAGNOSIS — N186 End stage renal disease: Secondary | ICD-10-CM

## 2015-10-03 DIAGNOSIS — R809 Proteinuria, unspecified: Secondary | ICD-10-CM

## 2015-10-03 DIAGNOSIS — D649 Anemia, unspecified: Secondary | ICD-10-CM

## 2015-10-03 DIAGNOSIS — R609 Edema, unspecified: Secondary | ICD-10-CM

## 2015-10-03 DIAGNOSIS — D696 Thrombocytopenia, unspecified: Secondary | ICD-10-CM

## 2015-10-03 DIAGNOSIS — D638 Anemia in other chronic diseases classified elsewhere: Secondary | ICD-10-CM

## 2015-10-03 DIAGNOSIS — E538 Deficiency of other specified B group vitamins: Secondary | ICD-10-CM

## 2015-10-03 LAB — CBC & DIFF AND RETIC
BASO%: 0.2 % (ref 0.0–2.0)
Basophils Absolute: 0 10*3/uL (ref 0.0–0.1)
EOS ABS: 0 10*3/uL (ref 0.0–0.5)
EOS%: 0.6 % (ref 0.0–7.0)
HCT: 26.7 % — ABNORMAL LOW (ref 38.4–49.9)
HEMOGLOBIN: 8.7 g/dL — AB (ref 13.0–17.1)
IMMATURE RETIC FRACT: 8 % (ref 3.00–10.60)
LYMPH%: 11.5 % — AB (ref 14.0–49.0)
MCH: 28.9 pg (ref 27.2–33.4)
MCHC: 32.6 g/dL (ref 32.0–36.0)
MCV: 88.7 fL (ref 79.3–98.0)
MONO#: 0.7 10*3/uL (ref 0.1–0.9)
MONO%: 11.1 % (ref 0.0–14.0)
NEUT#: 4.8 10*3/uL (ref 1.5–6.5)
NEUT%: 76.6 % — AB (ref 39.0–75.0)
PLATELETS: 114 10*3/uL — AB (ref 140–400)
RBC: 3.01 10*6/uL — ABNORMAL LOW (ref 4.20–5.82)
RDW: 16.1 % — ABNORMAL HIGH (ref 11.0–14.6)
Retic %: 2.52 % — ABNORMAL HIGH (ref 0.80–1.80)
Retic Ct Abs: 75.85 10*3/uL (ref 34.80–93.90)
WBC: 6.2 10*3/uL (ref 4.0–10.3)
lymph#: 0.7 10*3/uL — ABNORMAL LOW (ref 0.9–3.3)

## 2015-10-03 LAB — COMPREHENSIVE METABOLIC PANEL
ALBUMIN: 3.2 g/dL — AB (ref 3.5–5.0)
ALK PHOS: 63 U/L (ref 40–150)
ALT: 14 U/L (ref 0–55)
ANION GAP: 13 meq/L — AB (ref 3–11)
AST: 14 U/L (ref 5–34)
BILIRUBIN TOTAL: 0.32 mg/dL (ref 0.20–1.20)
BUN: 59.7 mg/dL — ABNORMAL HIGH (ref 7.0–26.0)
CALCIUM: 8.6 mg/dL (ref 8.4–10.4)
CO2: 18 mEq/L — ABNORMAL LOW (ref 22–29)
Chloride: 111 mEq/L — ABNORMAL HIGH (ref 98–109)
Creatinine: 3.7 mg/dL (ref 0.7–1.3)
EGFR: 16 mL/min/{1.73_m2} — AB (ref >90)
Glucose: 116 mg/dl (ref 70–140)
Potassium: 3.6 mEq/L (ref 3.5–5.1)
Sodium: 142 mEq/L (ref 136–145)
TOTAL PROTEIN: 6.3 g/dL — AB (ref 6.4–8.3)

## 2015-10-03 LAB — TECHNOLOGIST REVIEW

## 2015-10-03 NOTE — Telephone Encounter (Signed)
Apt sched, pt said he will get it on  mychart

## 2015-10-03 NOTE — Progress Notes (Signed)
.    Hematology oncology clinic note  Date of service  .10/03/2015  Patient Care Team: [name], MD as PCP - General (Family Medicine) [name], MD as PCP - Hematology/Oncology (Hematology and Oncology) [name], MD as Consulting Physician (Nephrology)  CHIEF COMPLAINTS: followup for AL amyloidosis.  HISTORY OF PRESENTING ILLNESS: Please see my initial consultation for details on presentation.  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 AL amyloidosis noted on kidney biopsy.  Had about 15 g of protein per 24 hours which produced a less than 50% suggesting renal response to treatment. - no overt heart failure   Current treatment Ixazomib + Dexamethasone s/p 4 cycles   previous treatment  CyBorD x 6 cycles Evaluated for and declined Auto HSCT at [facility]. - maintenance Velcade  INTERVAL HISTORY  Jose Garrett is here for his scheduled follow-up for AL amyloidosis. He just started his 5th cycle of Ixazomib and dexamethasone on 09/30/2015. Notes that he is feeling much better after this PRBC transfusion. Is getting Procrit every 2 weeks as per his nephrologist. No leg swelling. His last myeloma panel on 09/19/2015 showed no M protein and kappa lambda difference of less than 4. No other acute new symptoms.   MEDICAL HISTORY:  Past Medical History:  Diagnosis Date  . Anemia Dx 2016  . Chronic diastolic CHF (congestive heart failure), NYHA class 1 ([place])    /hospital problem list 09/08/2014  . Chronic renal disease, stage III    /hospital problem list 09/08/2014  . Enlarged heart 2015  . Family history of adverse reaction to anesthesia    "my daughter can't take certain anesthesia agents" (09/08/2014)  . History of blood transfusion 09/08/2014   "got hematoma after renal biopsy & HgB dropped"  . Hyperlipidemia Dx 2012  . Hypertension Dx 2012  . Renal disorder    "there is a spot on my kidney"  (09/08/2014)    SURGICAL HISTORY: Past Surgical History:  Procedure Laterality Date  . LAPAROSCOPIC CHOLECYSTECTOMY  03/2011  . RENAL BIOPSY, PERCUTANEOUS Right 09/08/2014  . TONSILLECTOMY  ~ 1960    SOCIAL HISTORY: Social History   Social History  . Marital status: Divorced    Spouse name: N/A  . Number of children: N/A  . Years of education: N/A   Occupational History  . Not on file.   Social History Main Topics  . Smoking status: Former Smoker    Packs/day: 2.00    Years: 40.00    Types: Cigarettes    Quit date: 03/19/2011  . Smokeless tobacco: Never Used  . Alcohol use No  . Drug use: No     Comment: 09/08/2014 "I have used marijuana till the 1990's". Notes that he quit 15 yrs ago  . Sexual activity: Not Currently   Other Topics Concern  . Not on file   Social History Narrative  . 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 Lives with his daughter and her finance.  FAMILY HISTORY: Family History  Problem Relation Age of Onset  . Hypertension Mother   . Diabetes Mother   . Heart disease Mother   . Skin cancer Mother   . Hypertension Father   . Diabetes Father   . Diabetes Sister     ALLERGIES:  has No Known Allergies.  MEDICATIONS:  Current Outpatient Prescriptions  Medication Sig Dispense Refill  . acyclovir (ZOVIRAX) 400 MG tablet TAKE 1  TABLET BY MOUTH DAILY. (DOSE ADJUSTED FOR RENAL FUNCTION) 60 tablet 3  . amLODipine (NORVASC) 10 MG tablet Take 1 tablet (10 mg total) by mouth daily. 30 tablet 5  . aspirin EC 81 MG tablet Take 1 tablet (81 mg total) by mouth daily. 30 tablet 11  . benzonatate (TESSALON) 100 MG capsule Take 2 capsules (200 mg total) by mouth 2 (two) times daily as needed for cough. 20 capsule 0  . carvedilol (COREG) 25 MG tablet Take 1 tablet (25 mg total) by mouth 2 (two) times daily with a meal. (Patient taking differently: Take 25 mg by mouth 2 (two) times daily with a meal. ) 60  tablet 5  . cetirizine (ZYRTEC) 5 MG tablet Take 1 tablet (5 mg total) by mouth daily. 30 tablet 5  . Cyanocobalamin (B-12) 1000 MCG SUBL Place 1,000 mcg under the tongue daily. 30 each 3  . dexamethasone (DECADRON) 4 MG tablet Take 5 tablets (20 mg total) by mouth once every week. 15 tablet 1  . doxycycline (VIBRA-TABS) 100 MG tablet Take 1 tablet (100 mg total) by mouth 2 (two) times daily. 14 tablet 0  . FLONASE 50 MCG/ACT nasal spray Place 1 spray into both nostrils daily. (Patient not taking: Reported on 08/22/2015) 15.8 g 0  . furosemide (LASIX) 40 MG tablet Take 1 tablet (40 mg total) by mouth 3 (three) times daily. 90 tablet 5  . gabapentin (NEURONTIN) 250 MG/5ML solution Take 6 mLs (300 mg total) by mouth at bedtime. May increased to '400mg'$  po HS if still having neuropathic pain 240 mL 1  . guaiFENesin-codeine 100-10 MG/5ML syrup Take 5-10 mLs by mouth every 6 (six) hours as needed for cough. 120 mL 0  . hydrALAZINE (APRESOLINE) 100 MG tablet Take 1 tablet (100 mg total) by mouth 3 (three) times daily. 90 tablet 5  . ixazomib citrate (NINLARO) 3 MG capsule Take 1 capsule (3 mg total) by mouth once a week. Take on an empty stomach 1hr before or 2hrs after food. Do not crush, chew, or open. 6 capsule 1  . LORazepam (ATIVAN) 1 MG tablet Take 1 tablet (1 mg total) by mouth every 8 (eight) hours as needed for anxiety (or nausea). 30 tablet 0  . ondansetron (ZOFRAN) 8 MG tablet Take 8 mg by mouth every 8 (eight) hours as needed for nausea or vomiting. Reported on 07/08/2015    . oxyCODONE (OXY IR/ROXICODONE) 5 MG immediate release tablet Take 1 tablet (5 mg total) by mouth every 4 (four) hours as needed for severe pain. (Patient not taking: Reported on 08/22/2015) 60 tablet 0  . vitamin E 100 UNIT capsule Take 100 Units by mouth every other day. Reported on 07/08/2015     No current facility-administered medications for this visit.     REVIEW OF SYSTEMS:   Constitutional: Denies fevers, chills or  abnormal night sweats Eyes: Denies blurriness of vision, double vision or watery eyes Ears, nose, mouth, throat, and face: Denies mucositis or sore throat Respiratory: Denies cough, dyspnea or wheezes Cardiovascular: Denies palpitation, chest discomfort or lower extremity swelling Gastrointestinal:  Denies nausea, heartburn or change in bowel habits Skin: Denies abnormal skin rashes Lymphatics: Denies new lymphadenopathy or easy bruising Neurological:Denies numbness, tingling or new weaknesses Behavioral/Psych: Mood is stable, no new changes  All other systems were reviewed with the patient and are negative.   PHYSICAL EXAMINATION: ECOG PERFORMANCE STATUS: 2 - Symptomatic, <50% confined to bed  Vitals:   10/03/15 0916  BP: [name])  154/49  Pulse: 75  Resp: 18  Temp: 98 F (36.7 C)   Filed Weights   10/03/15 0916  Weight: 196 lb 1.6 oz (89 kg)    GENERAL:alert, Mild this is due to sinus congestion and sore throat and cough. SKIN: significant conjunctival pallor noted.  EYES: nl EOM, PERL OROPHARYNX: Sinus congestion with mild sinus tenderness, erythematous pharynx with no overt exudates. No cervical adenopathy.  NECK: no JVD LYMPH:  no palpable lymphadenopathy in the cervical, axillary or inguinal LUNGS: clear to auscultation and percussion with normal breathing effort HEART: regular rate & rhythm and 2/6 SM aortic area ABDOMEN:abdomen soft, non-tender and normal bowel sounds Musculoskeletal: B/l trace pitting pedal edema No Tenderness. No redness.  PSYCH: alert & oriented x 3 with fluent speech NEURO: no focal motor/sensory deficits  LABORATORY DATA:   . CBC Latest Ref Rng & Units 10/03/2015 09/23/2015 09/19/2015  WBC 4.0 - 10.3 10e3/uL 6.2 - 6.6  Hemoglobin 13.0 - 17.1 g/dL 3.5(K) 8.1(L) 7.4(L)  Hematocrit 38.4 - 49.9 % 26.7(L) - 22.3(L)  Platelets 140 - 400 10e3/uL 114(L) - 79(L)   . CMP Latest Ref Rng & Units 10/03/2015 09/19/2015 09/19/2015  Glucose 70 - 140 mg/dl 113  205 -  BUN 7.0 - 99.5 mg/dL 59.7(H) 63.1(H) -  Creatinine 0.7 - 1.3 mg/dL 2.9(LV) 9.5(HU) -  Sodium 136 - 145 mEq/L 142 140 -  Potassium 3.5 - 5.1 mEq/L 3.6 3.6 -  Chloride 101 - 111 mmol/L - - -  CO2 22 - 29 mEq/L 18(L) 16(L) -  Calcium 8.4 - 10.4 mg/dL 8.6 1.9(D) -  Total Protein 6.4 - 8.3 g/dL 6.3(L) 6.1(L) 5.3(L)  Total Bilirubin 0.20 - 1.20 mg/dL 7.10 0.89 -  Alkaline Phos 40 - 150 U/L 63 60 -  AST 5 - 34 U/L 14 11 -  ALT 0 - 55 U/L 14 11 -          RADIOGRAPHIC STUDIES: I have personally reviewed the radiological images as listed and agreed with the findings in the report.  .No results found.   Echocardiogram 06/10/2015:   [facility]*  *[facility]*  [address]  [place], [place] [address]  [phone]  ------------------------------------------------------------------- Transthoracic Echocardiography  Patient: Jose Garrett, Jose Garrett Jose #: [id number] Study Date: 06/10/2015 Gender: M Age: 89 Height: 172.7 cm Weight: 88 kg BSA: 2.08 m^2 Pt. Status: Room:  SONOGRAPHER Jeryl Columbia PERFORMING [facility], Outpatient ATTENDING [facility]nia Lora Kishore  cc:  ------------------------------------------------------------------- LV EF: 60% - 65%  ------------------------------------------------------------------- History: PMH: Former Smoker, Renal Hematoma, Anemia. Amyloidosis. Risk factors: Hypertension.  ------------------------------------------------------------------- Study Conclusions  - Left ventricle: Global longitudinal LV strain is normal at -19.8%  The cavity size was normal. There was moderate concentric  hypertrophy. Systolic function was normal. The estimated ejection  fraction was in the range of 60% to 65%. Wall motion was normal;  there were no  regional wall motion abnormalities. Features are  consistent with a pseudonormal left ventricular filling pattern,  with concomitant abnormal relaxation and increased filling  pressure (grade 2 diastolic dysfunction). Doppler parameters are  consistent with high ventricular filling pressure. - Aortic valve: Severe diffuse thickening and calcification. There  was very mild stenosis. There was mild regurgitation. Valve area  (VTI): 1.41 cm^2. Valve area (Vmax): 1.46 cm^2. Valve area  (Vmean): 1.47 cm^2.  ASSESSMENT & PLAN:    Jose Garrett is very pleasant 65 yo caucasian male with   1) AL Amyloidosis noted on renal biopsy  with nephrotic range proteinuria on urinalysis and some lambda free light chains on serum and urinary IFE . On diagnosis:  SPEP with small amount of M-spike 0.3  Echo showed no systolic dysfunction but grade 2 diastolic dysfunction. [facility] cardiac staging 1 as per troponin and BNP criteria. Bone marrow with no overt evidence of multiple myeloma. PET/CT scan showed no evidence of bony lesions or lymphadenopathy . Urine showed about 15 g of protein in 24 hours   Patient is status post CyBorD x 6 cycles He was then on maintenance Velcade and had progression of his AL Amyloidosis.  He was then switched to Ixazomib and Dexamethasone and has completed 4 cycles without significant toxicities. His UPEP shows about 15 g of proteins per 24 hours but no M spike. SPEP from 09/19/2015 shows no M spike. Serum kappa lambda free light chains difference less than 4 Suggesting continued hematologic response.  Patient's creatinine is also somewhat better.  2) Thrombocytopenia (79K) likely from the Ixazomib. This is improved at 114 k.Patient is already on renally adjusted dose of the medication.  Plan -- No overt limiting toxicity from Ixazomib.  --Given good hematologic response would continue with the current regimen of Ixazomib + Dexamethasone. --continue on low-dose  aspirin for VTE prophylaxis given his high risk of venous thromboembolism in the setting of nephrotic syndrome.  -continue Acyclovir for shingles prophylaxis. -Echocardiogram every 6 months.  Next due in September 2017 -next cycle of Ixazomib starting in about 1 week. Patient is already on renally adjusted dose of this medication. If he were to require dialysis the dose will remain the same that would be administered after dialysis on the day of administration. -Repeat myeloma panel and serum free light chains in 1 month  2) multifactorial anemia - likely multifactorial -from AL amyloidosis, anemia from CKD. His drop in hemoglobin down to 7.4 predominantly to flex his significant end-stage renal disease   Lab Results  Component Value Date   FERRITIN 240 08/16/2015   B12 WNL -Previously deficient  Plan - No indication for PRBC transfusion at this time -Continue Procrit as per nephrology. -continue B12 replacement   3) CKD -related to hypertension and AL Amyloidosis. Creatinine is in the 2.7 to 3 range and had improved from his previous high of 3.5. Creatinine today is down from 4 to 3.7  4) Feet tingling and numbness ? Related to AL Amyloidosis vs Velcade vs B12 deficiency stable. Started taking gabapentin 12/16/2014. Much improved symptoms. 5)Bilateral pedal edema much improved. Ultrasound of bilateral lower extremity is negative for DVT.  Plan -lasix per PCP/nephrology. -Continue SL B12 -Continue close f/u with PCP and nephrology (Dr [name]). -Patient's CKD might progress despite hematologic response to treatment.  6) URI/Sinusitis - resovled with doxycycline -The patient has signs of worsening infection/sepsis would need to consider IVIG in the setting of hypogammaglobinemia.  Return to care with Dr. [name] in 4 weeks with cbc, cmp and Myeloma panel [facility]  All questions were answered. The patient knows to call the clinic with any problems, questions or concerns. I spent 25  minutes counseling the patient face to face. The total time spent in the appointment was 25 minutes and more than 50% was on counseling.   Sullivan Lone MD [place] Hematology/Oncology Physician [facility]  (Office):       [phone] (Work cell):  [phone] (Fax):           [phone]

## 2015-10-03 NOTE — Telephone Encounter (Signed)
No pof sent, gave pt avs

## 2015-10-06 ENCOUNTER — Other Ambulatory Visit: Payer: Self-pay | Admitting: Nephrology

## 2015-10-07 ENCOUNTER — Encounter (HOSPITAL_COMMUNITY)
Admission: RE | Admit: 2015-10-07 | Discharge: 2015-10-07 | Disposition: A | Discharge status: Home / Self Care | Payer: Self-pay | Source: Ambulatory Visit | Attending: Nephrology | Admitting: Nephrology

## 2015-10-07 DIAGNOSIS — D638 Anemia in other chronic diseases classified elsewhere: Secondary | ICD-10-CM

## 2015-10-07 LAB — IRON AND TIBC
IRON: 89 ug/dL (ref 45–182)
Saturation Ratios: 28 % (ref 17.9–39.5)
TIBC: 314 ug/dL (ref 250–450)
UIBC: 225 ug/dL

## 2015-10-07 LAB — FERRITIN: FERRITIN: 121 ng/mL (ref 24–336)

## 2015-10-07 LAB — POCT HEMOGLOBIN-HEMACUE: Hemoglobin: 9.3 g/dL — ABNORMAL LOW (ref 13.0–17.0)

## 2015-10-07 MED ORDER — EPOETIN ALFA 20000 UNIT/ML IJ SOLN
INTRAMUSCULAR | Status: AC
  Filled 2015-10-07: qty 1

## 2015-10-07 MED ORDER — EPOETIN ALFA 20000 UNIT/ML IJ SOLN
20000.0000 [IU] | INTRAMUSCULAR | Status: DC
  Administered 2015-10-07: 20000 [IU] via SUBCUTANEOUS

## 2015-10-11 ENCOUNTER — Telehealth: Payer: Self-pay | Admitting: Pharmacist

## 2015-10-11 NOTE — Telephone Encounter (Signed)
Oral Chemotherapy Pharmacist Encounter  Received call from [place] at [place] [facility] about patient's Medicaid application status. If patient is able to receive Medicaid, he will no longer be eligible for patient assistance through [place]. If the patient's Medicaid application is denied, we will be able to re-enroll him in this assistance program.   I s/w patient. He stated his application is still pending since they had asked him to provide some additional information approximately 2 weeks ago. Pt stated the request for this information was made over the phone and he does not have any written documentation about the reason his application is still pending.  Pt stated he will contact Medicaid this afternoon or tomorrow to get some form of written documentation. We may be able to use this information to acquire 1 more month of Ninlaro while we wait on final Medicaid result.  I called [facility] with this information. They will follow-up with the oral chemo clinic tomorrow (8/2) to see if we were able to locate written documentation.  Of note, patient will finish the last supply of Ninlaro this week. I did relay this information to the folks at [place] [facility].  Johny Drilling, PharmD, BCPS Oral Chemotherapy Clinic

## 2015-10-20 ENCOUNTER — Other Ambulatory Visit: Payer: Self-pay | Admitting: *Deleted

## 2015-10-20 ENCOUNTER — Telehealth: Payer: Self-pay | Admitting: Pharmacist

## 2015-10-20 DIAGNOSIS — E8581 Light chain (AL) amyloidosis: Secondary | ICD-10-CM

## 2015-10-20 MED ORDER — IXAZOMIB CITRATE 3 MG PO CAPS: 3.0000 mg | ORAL_CAPSULE | ORAL | 2 refills | Status: DC

## 2015-10-20 NOTE — Telephone Encounter (Signed)
Oral Chemotherapy Pharmacist Encounter   Received a voicemail from [place] [facility] about status of patient's Medicaid application. They noted that they are able to extend patient's enrollment in the program for an additional 30 days pending the decision for Medicaid coverage. They requested that Oral Chemo Clinic provide information from patient about status of Medicaid application, and a new prescription for Ninlaro in order to provide any additional services.  I called patient and left voicemail requesting status of Medicaid application and noting that I will request new prescription from Dr. [name].  MD to sign new Rx and then I will fax to [facility] at [facility] will continue to follow.  Johny Drilling, PharmD, BCPS Oral Chemotherapy Clinic

## 2015-10-21 ENCOUNTER — Telehealth: Payer: Self-pay | Admitting: Pharmacist

## 2015-10-21 ENCOUNTER — Encounter (HOSPITAL_COMMUNITY)
Admission: RE | Admit: 2015-10-21 | Discharge: 2015-10-21 | Disposition: A | Discharge status: Home / Self Care | Payer: Self-pay | Source: Ambulatory Visit | Attending: Nephrology | Admitting: Nephrology

## 2015-10-21 DIAGNOSIS — D638 Anemia in other chronic diseases classified elsewhere: Secondary | ICD-10-CM | POA: Insufficient documentation

## 2015-10-21 DIAGNOSIS — N184 Chronic kidney disease, stage 4 (severe): Secondary | ICD-10-CM | POA: Insufficient documentation

## 2015-10-21 LAB — POCT HEMOGLOBIN-HEMACUE: Hemoglobin: 9.4 g/dL — ABNORMAL LOW (ref 13.0–17.0)

## 2015-10-21 MED ORDER — EPOETIN ALFA 20000 UNIT/ML IJ SOLN
20000.0000 [IU] | INTRAMUSCULAR | Status: DC
  Administered 2015-10-21: 20000 [IU] via SUBCUTANEOUS

## 2015-10-21 MED ORDER — EPOETIN ALFA 20000 UNIT/ML IJ SOLN
INTRAMUSCULAR | Status: AC
  Filled 2015-10-21: qty 1

## 2015-10-21 NOTE — Telephone Encounter (Signed)
Oral Chemotherapy Pharmacist Encounter   Patient returned call this pharmacist had made yesterday. Medicaid application has been resubmitted electronically and is pending.  Pt stated that he is experiencing some manageable LE neuropathy and sometimes insomnia on the days her takes dexamethasone. He is in agreement to continue Ninlaro at this time.  I called [facility] after I spoke with the patient to give them an update on the patient's medicaid status and to confirm receipt of the presciption sent yesterday (8/10). Rx is being processed, they will continue to reach out about the Medicaid application  Oral Chemo Clinic will continue to follow.  Johny Drilling, PharmD, BCPS Oral Chemotherapy Clinic

## 2015-10-22 LAB — PTH, INTACT AND CALCIUM
CALCIUM TOTAL (PTH): 7.9 mg/dL — AB (ref 8.6–10.2)
PTH: 189 pg/mL — AB (ref 15–65)

## 2015-11-01 ENCOUNTER — Other Ambulatory Visit (HOSPITAL_BASED_OUTPATIENT_CLINIC_OR_DEPARTMENT_OTHER): Payer: Self-pay

## 2015-11-01 ENCOUNTER — Ambulatory Visit (HOSPITAL_BASED_OUTPATIENT_CLINIC_OR_DEPARTMENT_OTHER): Payer: Self-pay | Admitting: Hematology

## 2015-11-01 ENCOUNTER — Telehealth: Payer: Self-pay | Admitting: Hematology

## 2015-11-01 ENCOUNTER — Other Ambulatory Visit: Payer: Self-pay | Admitting: *Deleted

## 2015-11-01 ENCOUNTER — Encounter: Payer: Self-pay | Admitting: Hematology

## 2015-11-01 VITALS — BP 149/52 | HR 73 | Temp 98.2°F | Resp 17 | Ht 69.0 in | Wt 201.6 lb

## 2015-11-01 DIAGNOSIS — E8581 Light chain (AL) amyloidosis: Secondary | ICD-10-CM

## 2015-11-01 DIAGNOSIS — E858 Other amyloidosis: Secondary | ICD-10-CM

## 2015-11-01 DIAGNOSIS — N189 Chronic kidney disease, unspecified: Secondary | ICD-10-CM

## 2015-11-01 DIAGNOSIS — R609 Edema, unspecified: Secondary | ICD-10-CM

## 2015-11-01 DIAGNOSIS — D638 Anemia in other chronic diseases classified elsewhere: Secondary | ICD-10-CM

## 2015-11-01 DIAGNOSIS — D696 Thrombocytopenia, unspecified: Secondary | ICD-10-CM

## 2015-11-01 DIAGNOSIS — D631 Anemia in chronic kidney disease: Secondary | ICD-10-CM

## 2015-11-01 DIAGNOSIS — I129 Hypertensive chronic kidney disease with stage 1 through stage 4 chronic kidney disease, or unspecified chronic kidney disease: Secondary | ICD-10-CM

## 2015-11-01 LAB — TECHNOLOGIST REVIEW

## 2015-11-01 LAB — CBC & DIFF AND RETIC
BASO%: 0.2 % (ref 0.0–2.0)
BASOS ABS: 0 10*3/uL (ref 0.0–0.1)
EOS ABS: 0.3 10*3/uL (ref 0.0–0.5)
EOS%: 5.7 % (ref 0.0–7.0)
HEMATOCRIT: 28.2 % — AB (ref 38.4–49.9)
HGB: 9.2 g/dL — ABNORMAL LOW (ref 13.0–17.1)
Immature Retic Fract: 7.8 % (ref 3.00–10.60)
LYMPH%: 8.5 % — AB (ref 14.0–49.0)
MCH: 28.8 pg (ref 27.2–33.4)
MCHC: 32.6 g/dL (ref 32.0–36.0)
MCV: 88.4 fL (ref 79.3–98.0)
MONO#: 0.6 10*3/uL (ref 0.1–0.9)
MONO%: 11.8 % (ref 0.0–14.0)
NEUT#: 3.9 10*3/uL (ref 1.5–6.5)
NEUT%: 73.8 % (ref 39.0–75.0)
Platelets: 92 10*3/uL — ABNORMAL LOW (ref 140–400)
RBC: 3.19 10*6/uL — AB (ref 4.20–5.82)
RDW: 16.4 % — AB (ref 11.0–14.6)
RETIC CT ABS: 98.57 10*3/uL — AB (ref 34.80–93.90)
Retic %: 3.09 % — ABNORMAL HIGH (ref 0.80–1.80)
WBC: 5.3 10*3/uL (ref 4.0–10.3)
lymph#: 0.5 10*3/uL — ABNORMAL LOW (ref 0.9–3.3)

## 2015-11-01 LAB — COMPREHENSIVE METABOLIC PANEL
ALT: 12 U/L (ref 0–55)
AST: 13 U/L (ref 5–34)
Albumin: 3.1 g/dL — ABNORMAL LOW (ref 3.5–5.0)
Alkaline Phosphatase: 63 U/L (ref 40–150)
Anion Gap: 11 mEq/L (ref 3–11)
BUN: 44.1 mg/dL — AB (ref 7.0–26.0)
CHLORIDE: 110 meq/L — AB (ref 98–109)
CO2: 19 meq/L — AB (ref 22–29)
CREATININE: 3.8 mg/dL — AB (ref 0.7–1.3)
Calcium: 8.2 mg/dL — ABNORMAL LOW (ref 8.4–10.4)
EGFR: 16 mL/min/{1.73_m2} — ABNORMAL LOW (ref >90)
GLUCOSE: 149 mg/dL — AB (ref 70–140)
Potassium: 3.4 mEq/L — ABNORMAL LOW (ref 3.5–5.1)
SODIUM: 140 meq/L (ref 136–145)
Total Bilirubin: 0.48 mg/dL (ref 0.20–1.20)
Total Protein: 6 g/dL — ABNORMAL LOW (ref 6.4–8.3)

## 2015-11-01 MED ORDER — OXYCODONE HCL 5 MG PO TABS: 5.0000 mg | ORAL_TABLET | ORAL | 0 refills | Status: DC | PRN

## 2015-11-01 NOTE — Progress Notes (Signed)
.    Hematology oncology clinic note  Date of service  .11/01/2015  Patient Care Team: [name], MD as PCP - General (Family Medicine) [name], MD as PCP - Hematology/Oncology (Hematology and Oncology) [name], MD as Consulting Physician (Nephrology)  CHIEF COMPLAINTS: followup for AL amyloidosis.  HISTORY OF PRESENTING ILLNESS: Please see my initial consultation for details on presentation.  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 AL amyloidosis noted on kidney biopsy.  Had about 15 g of protein per 24 hours which produced a less than 50% suggesting renal response to treatment. - no overt heart failure   Current treatment Ixazomib + Dexamethasone s/p 5 cycles today is cycle 6 day 8   previous treatment  CyBorD x 6 cycles Evaluated for and declined Auto HSCT at [facility] hospital. - maintenance Velcade  INTERVAL HISTORY  Jose Garrett is here for his scheduled follow-up for AL amyloidosis. Today is cycle 6 day 8 of Ixazomib + Dexamethasone. He notes some insomnia on the days he takes his dexamethasone but has not been taking his gabapentin on these days and was told to take his gabapentin every day. No change in his neuropathy. Hemoglobin is stable with his Procrit shots. He is to follow with nephrology. Creatinine is stable in the mid to high 3 range. No other acute new symptoms. Notes some mild traumatic bruising on his forearms bilaterally. Accentuated by thrombocytopenia, steroid use and possibly uremic platelet dysfunction.  MEDICAL HISTORY:  Past Medical History:  Diagnosis Date  . Anemia Dx 2016  . Chronic diastolic CHF (congestive heart failure), NYHA class 1 ([place])    /hospital problem list 09/08/2014  . Chronic renal disease, stage III    /hospital problem list 09/08/2014  . Enlarged heart 2015  . Family history of adverse reaction to anesthesia    "my daughter can't take certain  anesthesia agents" (09/08/2014)  . History of blood transfusion 09/08/2014   "got hematoma after renal biopsy & HgB dropped"  . Hyperlipidemia Dx 2012  . Hypertension Dx 2012  . Renal disorder    "there is a spot on my kidney" (09/08/2014)    SURGICAL HISTORY: Past Surgical History:  Procedure Laterality Date  . LAPAROSCOPIC CHOLECYSTECTOMY  03/2011  . RENAL BIOPSY, PERCUTANEOUS Right 09/08/2014  . TONSILLECTOMY  ~ 1960    SOCIAL HISTORY: Social History   Social History  . Marital status: Divorced    Spouse name: N/A  . Number of children: N/A  . Years of education: N/A   Occupational History  . Not on file.   Social History Main Topics  . Smoking status: Former Smoker    Packs/day: 2.00    Years: 40.00    Types: Cigarettes    Quit date: 03/19/2011  . Smokeless tobacco: Never Used  . Alcohol use No  . Drug use: No     Comment: 09/08/2014 "I have used marijuana till the 1990's". Notes that he quit 15 yrs ago  . Sexual activity: Not Currently   Other Topics Concern  . Not on file   Social History Narrative  . 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 Lives with his daughter and her finance.  FAMILY HISTORY: Family History  Problem Relation Age of Onset  . Hypertension Mother   . Diabetes Mother   . Heart disease Mother   . Skin cancer Mother   . Hypertension Father   .  Diabetes Father   . Diabetes Sister     ALLERGIES:  has No Known Allergies.  MEDICATIONS:  Current Outpatient Prescriptions  Medication Sig Dispense Refill  . acyclovir (ZOVIRAX) 400 MG tablet TAKE 1 TABLET BY MOUTH DAILY. (DOSE ADJUSTED FOR RENAL FUNCTION) 60 tablet 3  . amLODipine (NORVASC) 10 MG tablet Take 1 tablet (10 mg total) by mouth daily. 30 tablet 5  . aspirin EC 81 MG tablet Take 1 tablet (81 mg total) by mouth daily. 30 tablet 11  . benzonatate (TESSALON) 100 MG capsule Take 2 capsules (200 mg total) by mouth 2 (two) times daily  as needed for cough. 20 capsule 0  . carvedilol (COREG) 25 MG tablet Take 1 tablet (25 mg total) by mouth 2 (two) times daily with a meal. (Patient taking differently: Take 25 mg by mouth 2 (two) times daily with a meal. ) 60 tablet 5  . cetirizine (ZYRTEC) 5 MG tablet Take 1 tablet (5 mg total) by mouth daily. 30 tablet 5  . Cyanocobalamin (B-12) 1000 MCG SUBL Place 1,000 mcg under the tongue daily. 30 each 3  . dexamethasone (DECADRON) 4 MG tablet Take 5 tablets (20 mg total) by mouth once every week. 15 tablet 1  . FLONASE 50 MCG/ACT nasal spray Place 1 spray into both nostrils daily. (Patient not taking: Reported on 08/22/2015) 15.8 g 0  . furosemide (LASIX) 40 MG tablet Take 1 tablet (40 mg total) by mouth 3 (three) times daily. 90 tablet 5  . gabapentin (NEURONTIN) 250 MG/5ML solution Take 6 mLs (300 mg total) by mouth at bedtime. May increased to '400mg'$  po HS if still having neuropathic pain 240 mL 1  . hydrALAZINE (APRESOLINE) 100 MG tablet Take 1 tablet (100 mg total) by mouth 3 (three) times daily. 90 tablet 5  . ixazomib citrate (NINLARO) 3 MG capsule Take 1 capsule (3 mg total) by mouth once a week. Take on an empty stomach 1hr before or 2hrs after food. Do not crush, chew, or open. 6 capsule 2  . LORazepam (ATIVAN) 1 MG tablet Take 1 tablet (1 mg total) by mouth every 8 (eight) hours as needed for anxiety (or nausea). 30 tablet 0  . ondansetron (ZOFRAN) 8 MG tablet Take 8 mg by mouth every 8 (eight) hours as needed for nausea or vomiting. Reported on 07/08/2015    . oxyCODONE (OXY IR/ROXICODONE) 5 MG immediate release tablet Take 1 tablet (5 mg total) by mouth every 4 (four) hours as needed for severe pain. 60 tablet 0  . vitamin E 100 UNIT capsule Take 100 Units by mouth every other day. Reported on 07/08/2015     No current facility-administered medications for this visit.     REVIEW OF SYSTEMS:   Constitutional: Denies fevers, chills or abnormal night sweats Eyes: Denies blurriness of  vision, double vision or watery eyes Ears, nose, mouth, throat, and face: Denies mucositis or sore throat Respiratory: Denies cough, dyspnea or wheezes Cardiovascular: Denies palpitation, chest discomfort or lower extremity swelling Gastrointestinal:  Denies nausea, heartburn or change in bowel habits Skin: Denies abnormal skin rashes Lymphatics: Denies new lymphadenopathy or easy bruising Neurological:Denies numbness, tingling or new weaknesses Behavioral/Psych: Mood is stable, no new changes  All other systems were reviewed with the patient and are negative.   PHYSICAL EXAMINATION: ECOG PERFORMANCE STATUS: 2 - Symptomatic, <50% confined to bed  Vitals:   11/01/15 1010  BP: (!) 149/52  Pulse: 73  Resp: 17  Temp: 98.2 F (36.8 C)  Filed Weights   11/01/15 1010  Weight: 201 lb 9.6 oz (91.4 kg)    GENERAL:alert, Mild this is due to sinus congestion and sore throat and cough. SKIN: significant conjunctival pallor noted.  EYES: nl EOM, PERL OROPHARYNX: Sinus congestion with mild sinus tenderness, erythematous pharynx with no overt exudates. No cervical adenopathy.  NECK: no JVD LYMPH:  no palpable lymphadenopathy in the cervical, axillary or inguinal LUNGS: clear to auscultation and percussion with normal breathing effort HEART: regular rate & rhythm and 2/6 SM aortic area ABDOMEN:abdomen soft, non-tender and normal bowel sounds Musculoskeletal: B/l trace pitting pedal edema No Tenderness. No redness.  PSYCH: alert & oriented x 3 with fluent speech NEURO: no focal motor/sensory deficits  LABORATORY DATA:   . CBC Latest Ref Rng & Units 11/01/2015 10/21/2015 10/07/2015  WBC 4.0 - 10.3 10e3/uL 5.3 - -  Hemoglobin 13.0 - 17.1 g/dL 9.2(L) 9.4(L) 9.3(L)  Hematocrit 38.4 - 49.9 % 28.2(L) - -  Platelets 140 - 400 10e3/uL 92(L) - -   . CMP Latest Ref Rng & Units 11/01/2015 10/21/2015 10/03/2015  Glucose 70 - 140 mg/dl 149(H) - 116  BUN 7.0 - 26.0 mg/dL 44.1(H) - 59.7(H)    Creatinine 0.7 - 1.3 mg/dL 3.8(HH) - 3.7(HH)  Sodium 136 - 145 mEq/L 140 - 142  Potassium 3.5 - 5.1 mEq/L 3.4(L) - 3.6  Chloride 101 - 111 mmol/L - - -  CO2 22 - 29 mEq/L 19(L) - 18(L)  Calcium 8.4 - 10.4 mg/dL 8.2(L) 7.9(L) 8.6  Total Protein 6.4 - 8.3 g/dL 6.0(L) - 6.3(L)  Total Bilirubin 0.20 - 1.20 mg/dL 0.48 - 0.32  Alkaline Phos 40 - 150 U/L 63 - 63  AST 5 - 34 U/L 13 - 14  ALT 0 - 55 U/L 12 - 14          RADIOGRAPHIC STUDIES: I have personally reviewed the radiological images as listed and agreed with the findings in the report.  .No results found.   Echocardiogram 06/10/2015:   [facility]*  *[facility] [address].  [place], [place] [address]  [phone]  ------------------------------------------------------------------- Transthoracic Echocardiography  Patient: Garrett, Jose MR #: [id number] Study Date: 06/10/2015 Gender: M Age: 12 Height: 172.7 cm Weight: 88 kg BSA: 2.08 m^2 Pt. Status: Room:  SONOGRAPHER [place], Outpatient ATTENDING Sullivan Lone [facility] Sullivan Lone Kishore  cc:  ------------------------------------------------------------------- LV EF: 60% - 65%  ------------------------------------------------------------------- History: PMH: Former Smoker, Renal Hematoma, Anemia. Amyloidosis. Risk factors: Hypertension.  ------------------------------------------------------------------- Study Conclusions  - Left ventricle: Global longitudinal LV strain is normal at -19.8%  The cavity size was normal. There was moderate concentric  hypertrophy. Systolic function was normal. The estimated ejection  fraction was in the range of 60% to 65%. Wall motion was normal;  there were no regional wall motion abnormalities. Features  are  consistent with a pseudonormal left ventricular filling pattern,  with concomitant abnormal relaxation and increased filling  pressure (grade 2 diastolic dysfunction). Doppler parameters are  consistent with high ventricular filling pressure. - Aortic valve: Severe diffuse thickening and calcification. There  was very mild stenosis. There was mild regurgitation. Valve area  (VTI): 1.41 cm^2. Valve area (Vmax): 1.46 cm^2. Valve area  (Vmean): 1.47 cm^2.  ASSESSMENT & PLAN:    Mr Jose Garrett is very pleasant 65 yo caucasian male with   1) AL Amyloidosis noted on renal biopsy with nephrotic range proteinuria on urinalysis and some lambda free light chains on serum  and urinary IFE . On diagnosis:  SPEP with small amount of M-spike 0.3  Echo showed no systolic dysfunction but grade 2 diastolic dysfunction. [facility] cardiac staging 1 as per troponin and BNP criteria. Bone marrow with no overt evidence of multiple myeloma. PET/CT scan showed no evidence of bony lesions or lymphadenopathy . Urine showed about 15 g of protein in 24 hours   Patient is status post CyBorD x 6 cycles He was then on maintenance Velcade and had progression of his AL Amyloidosis.  He was then switched to Ixazomib and Dexamethasone and has completed 5 cycles without significant toxicities. His UPEP shows about 15 g of proteins per 24 hours but no M spike. SPEP from 09/19/2015 shows no M spike. Serum kappa lambda free light chains difference less than 4 Suggesting continued hematologic response.  Patient's creatinine remains stable in the mid-to high 3 range.  2) Thrombocytopenia (92K) likely from the Ixazomib. Patient is already on renally adjusted dose of the medication.  Plan -- No overt limiting toxicity from Ixazomib + Dexamethasone --Follow-up on myeloma labs and serum free light chains done today. --continue with the current regimen of Ixazomib + Dexamethasone. --continue on low-dose aspirin for  VTE prophylaxis given his high risk of venous thromboembolism in the setting of nephrotic syndrome.  -continue Acyclovir for shingles prophylaxis. -Echocardiogram every 6 months.  Next due in September 2017  2) multifactorial anemia - likely multifactorial -from AL amyloidosis, anemia from CKD. Hemoglobin is improved at 9.2 and has predominantly due to his significant chronic kidney disease  Lab Results  Component Value Date   FERRITIN 121 10/07/2015   B12 WNL -Previously deficient  Plan - No indication for PRBC transfusion at this time -Continue Procrit as per nephrology. -IV iron when necessary to maintain ferritin 100 -continue B12 replacement   3) CKD -related to hypertension and AL Amyloidosis. Creatinine stable  4) Feet tingling and numbness ? Related to AL Amyloidosis vs Velcade vs B12 deficiency stable. Started taking gabapentin 12/16/2014. Much improved symptoms. 5)Bilateral pedal edema much improved. Ultrasound of bilateral lower extremity is negative for DVT.  Plan -lasix per PCP/nephrology. -Continue SL B12 -Continue close f/u with PCP and nephrology (Dr [name]). -Patient's CKD might progress despite hematologic response to treatment.  6) URI/Sinusitis - resovled with doxycycline -The patient has signs of worsening infection/sepsis would need to consider IVIG in the setting of hypogammaglobinemia.  Return to care with Dr. [name] in 3 weeks with cbc, cmp and Myeloma panel [facility]  All questions were answered. The patient knows to call the clinic with any problems, questions or concerns. I spent 25 minutes counseling the patient face to face. The total time spent in the appointment was 25 minutes and more than 50% was on counseling.   Sullivan Lone MD [place] Hematology/Oncology Physician [facility]  (Office):       [phone] (Work cell):  [phone] (Fax):           [phone]

## 2015-11-01 NOTE — Telephone Encounter (Signed)
Gave patient avs report and appointments for September.  °

## 2015-11-02 LAB — KAPPA/LAMBDA LIGHT CHAINS
IG LAMBDA FREE LIGHT CHAIN: 25.2 mg/L (ref 5.7–26.3)
Ig Kappa Free Light Chain: 27.7 mg/L — ABNORMAL HIGH (ref 3.3–19.4)
KAPPA/LAMBDA FLC RATIO: 1.1 (ref 0.26–1.65)

## 2015-11-04 ENCOUNTER — Encounter (HOSPITAL_COMMUNITY)
Admission: RE | Admit: 2015-11-04 | Discharge: 2015-11-04 | Disposition: A | Discharge status: Home / Self Care | Payer: Self-pay | Source: Ambulatory Visit | Attending: Nephrology | Admitting: Nephrology

## 2015-11-04 DIAGNOSIS — D638 Anemia in other chronic diseases classified elsewhere: Secondary | ICD-10-CM

## 2015-11-04 LAB — MULTIPLE MYELOMA PANEL, SERUM
ALBUMIN SERPL ELPH-MCNC: 3.3 g/dL (ref 2.9–4.4)
ALPHA 1: 0.2 g/dL (ref 0.0–0.4)
Albumin/Glob SerPl: 1.5 (ref 0.7–1.7)
Alpha2 Glob SerPl Elph-Mcnc: 1 g/dL (ref 0.4–1.0)
B-GLOBULIN SERPL ELPH-MCNC: 0.8 g/dL (ref 0.7–1.3)
GAMMA GLOB SERPL ELPH-MCNC: 0.3 g/dL — AB (ref 0.4–1.8)
GLOBULIN, TOTAL: 2.3 g/dL (ref 2.2–3.9)
IGG (IMMUNOGLOBIN G), SERUM: 257 mg/dL — AB (ref 700–1600)
IgA, Qn, Serum: 63 mg/dL (ref 61–437)
IgM, Qn, Serum: 94 mg/dL (ref 20–172)
Total Protein: 5.6 g/dL — ABNORMAL LOW (ref 6.0–8.5)

## 2015-11-04 LAB — FERRITIN: Ferritin: 58 ng/mL (ref 24–336)

## 2015-11-04 LAB — IRON AND TIBC
IRON: 99 ug/dL (ref 45–182)
SATURATION RATIOS: 30 % (ref 17.9–39.5)
TIBC: 329 ug/dL (ref 250–450)
UIBC: 230 ug/dL

## 2015-11-04 LAB — POCT HEMOGLOBIN-HEMACUE: Hemoglobin: 9.2 g/dL — ABNORMAL LOW (ref 13.0–17.0)

## 2015-11-04 MED ORDER — EPOETIN ALFA 20000 UNIT/ML IJ SOLN
INTRAMUSCULAR | Status: AC
  Filled 2015-11-04: qty 1

## 2015-11-04 MED ORDER — EPOETIN ALFA 20000 UNIT/ML IJ SOLN
20000.0000 [IU] | INTRAMUSCULAR | Status: DC
  Administered 2015-11-04: 20000 [IU] via SUBCUTANEOUS

## 2015-11-18 ENCOUNTER — Telehealth: Payer: Self-pay | Admitting: Pharmacist

## 2015-11-18 ENCOUNTER — Encounter (HOSPITAL_COMMUNITY)
Admission: RE | Admit: 2015-11-18 | Discharge: 2015-11-18 | Disposition: A | Discharge status: Home / Self Care | Payer: Self-pay | Source: Ambulatory Visit | Attending: Nephrology | Admitting: Nephrology

## 2015-11-18 DIAGNOSIS — D638 Anemia in other chronic diseases classified elsewhere: Secondary | ICD-10-CM | POA: Insufficient documentation

## 2015-11-18 DIAGNOSIS — N184 Chronic kidney disease, stage 4 (severe): Secondary | ICD-10-CM | POA: Insufficient documentation

## 2015-11-18 LAB — POCT HEMOGLOBIN-HEMACUE: Hemoglobin: 9.6 g/dL — ABNORMAL LOW (ref 13.0–17.0)

## 2015-11-18 MED ORDER — EPOETIN ALFA 20000 UNIT/ML IJ SOLN
INTRAMUSCULAR | Status: AC
  Filled 2015-11-18: qty 1

## 2015-11-18 MED ORDER — EPOETIN ALFA 20000 UNIT/ML IJ SOLN
20000.0000 [IU] | INTRAMUSCULAR | Status: DC
  Administered 2015-11-18: 20000 [IU] via SUBCUTANEOUS

## 2015-11-18 NOTE — Telephone Encounter (Signed)
Received fax notification from [place].  His eligibility period has expired (09/12/15 - 11/11/15).  If patient is still in need of assistance to contact [phone].  Pt is still in need of assistance.  His Medicaid application is still pending. I called [employer] and informed them of the above information. Caller stated they needed the pending medicaid application reference number to continue to extend his eligibility period.   Called [name] and informed him of the above information. He will get that number and call us back or provide that information at his MD office visit on 9/12.

## 2015-11-22 ENCOUNTER — Encounter: Payer: Self-pay | Admitting: Hematology

## 2015-11-22 ENCOUNTER — Other Ambulatory Visit (HOSPITAL_BASED_OUTPATIENT_CLINIC_OR_DEPARTMENT_OTHER): Payer: Self-pay

## 2015-11-22 ENCOUNTER — Telehealth: Payer: Self-pay | Admitting: Hematology

## 2015-11-22 ENCOUNTER — Ambulatory Visit (HOSPITAL_BASED_OUTPATIENT_CLINIC_OR_DEPARTMENT_OTHER): Payer: Self-pay | Admitting: Hematology

## 2015-11-22 ENCOUNTER — Telehealth: Payer: Self-pay | Admitting: *Deleted

## 2015-11-22 VITALS — BP 137/56 | HR 80 | Temp 98.0°F | Resp 17 | Ht 69.0 in | Wt 207.2 lb

## 2015-11-22 DIAGNOSIS — D696 Thrombocytopenia, unspecified: Secondary | ICD-10-CM

## 2015-11-22 DIAGNOSIS — E538 Deficiency of other specified B group vitamins: Secondary | ICD-10-CM

## 2015-11-22 DIAGNOSIS — R609 Edema, unspecified: Secondary | ICD-10-CM

## 2015-11-22 DIAGNOSIS — E858 Other amyloidosis: Secondary | ICD-10-CM | POA: Diagnosis not present

## 2015-11-22 DIAGNOSIS — E8581 Light chain (AL) amyloidosis: Secondary | ICD-10-CM

## 2015-11-22 DIAGNOSIS — R809 Proteinuria, unspecified: Secondary | ICD-10-CM

## 2015-11-22 DIAGNOSIS — N189 Chronic kidney disease, unspecified: Secondary | ICD-10-CM

## 2015-11-22 DIAGNOSIS — D631 Anemia in chronic kidney disease: Secondary | ICD-10-CM

## 2015-11-22 DIAGNOSIS — D509 Iron deficiency anemia, unspecified: Secondary | ICD-10-CM | POA: Insufficient documentation

## 2015-11-22 DIAGNOSIS — I129 Hypertensive chronic kidney disease with stage 1 through stage 4 chronic kidney disease, or unspecified chronic kidney disease: Secondary | ICD-10-CM

## 2015-11-22 DIAGNOSIS — R200 Anesthesia of skin: Secondary | ICD-10-CM

## 2015-11-22 DIAGNOSIS — D638 Anemia in other chronic diseases classified elsewhere: Secondary | ICD-10-CM

## 2015-11-22 DIAGNOSIS — G629 Polyneuropathy, unspecified: Secondary | ICD-10-CM

## 2015-11-22 LAB — CBC & DIFF AND RETIC
BASO%: 0.4 % (ref 0.0–2.0)
Basophils Absolute: 0 10*3/uL (ref 0.0–0.1)
EOS ABS: 0.4 10*3/uL (ref 0.0–0.5)
EOS%: 6 % (ref 0.0–7.0)
HCT: 28.6 % — ABNORMAL LOW (ref 38.4–49.9)
HEMOGLOBIN: 9.3 g/dL — AB (ref 13.0–17.1)
Immature Retic Fract: 19 % — ABNORMAL HIGH (ref 3.00–10.60)
LYMPH%: 12.9 % — ABNORMAL LOW (ref 14.0–49.0)
MCH: 28.7 pg (ref 27.2–33.4)
MCHC: 32.5 g/dL (ref 32.0–36.0)
MCV: 88.3 fL (ref 79.3–98.0)
MONO#: 0.7 10*3/uL (ref 0.1–0.9)
MONO%: 9.6 % (ref 0.0–14.0)
NEUT%: 71.1 % (ref 39.0–75.0)
NEUTROS ABS: 5.1 10*3/uL (ref 1.5–6.5)
Platelets: 123 10*3/uL — ABNORMAL LOW (ref 140–400)
RBC: 3.24 10*6/uL — AB (ref 4.20–5.82)
RDW: 16.1 % — AB (ref 11.0–14.6)
RETIC %: 2.14 % — AB (ref 0.80–1.80)
Retic Ct Abs: 69.34 10*3/uL (ref 34.80–93.90)
WBC: 7.2 10*3/uL (ref 4.0–10.3)
lymph#: 0.9 10*3/uL (ref 0.9–3.3)

## 2015-11-22 LAB — COMPREHENSIVE METABOLIC PANEL
ALBUMIN: 3 g/dL — AB (ref 3.5–5.0)
ALT: 16 U/L (ref 0–55)
AST: 14 U/L (ref 5–34)
Alkaline Phosphatase: 68 U/L (ref 40–150)
Anion Gap: 12 mEq/L — ABNORMAL HIGH (ref 3–11)
BUN: 34.7 mg/dL — AB (ref 7.0–26.0)
CHLORIDE: 110 meq/L — AB (ref 98–109)
CO2: 17 meq/L — AB (ref 22–29)
Calcium: 8.6 mg/dL (ref 8.4–10.4)
Creatinine: 4.3 mg/dL (ref 0.7–1.3)
EGFR: 14 mL/min/{1.73_m2} — ABNORMAL LOW (ref >90)
GLUCOSE: 173 mg/dL — AB (ref 70–140)
POTASSIUM: 3.7 meq/L (ref 3.5–5.1)
SODIUM: 139 meq/L (ref 136–145)
Total Bilirubin: 0.41 mg/dL (ref 0.20–1.20)
Total Protein: 6.3 g/dL — ABNORMAL LOW (ref 6.4–8.3)

## 2015-11-22 LAB — TECHNOLOGIST REVIEW

## 2015-11-22 MED ORDER — IXAZOMIB CITRATE 3 MG PO CAPS: 3.0000 mg | ORAL_CAPSULE | ORAL | 2 refills | Status: DC

## 2015-11-22 NOTE — Telephone Encounter (Signed)
Per LOS I have scheduled apapts, notified scheduler

## 2015-11-22 NOTE — Telephone Encounter (Signed)
Message sent to Infusion scheduler to add Infusion per 11/12/15 los. Avs report and schedule given per 11/22/15.

## 2015-11-23 LAB — KAPPA/LAMBDA LIGHT CHAINS
Ig Kappa Free Light Chain: 36.6 mg/L — ABNORMAL HIGH (ref 3.3–19.4)
Ig Lambda Free Light Chain: 34.8 mg/L — ABNORMAL HIGH (ref 5.7–26.3)
KAPPA/LAMBDA FLC RATIO: 1.05 (ref 0.26–1.65)

## 2015-11-24 ENCOUNTER — Telehealth: Payer: Self-pay | Admitting: Pharmacist

## 2015-11-24 LAB — MULTIPLE MYELOMA PANEL, SERUM
Albumin SerPl Elph-Mcnc: 3.1 g/dL (ref 2.9–4.4)
Albumin/Glob SerPl: 1.3 (ref 0.7–1.7)
Alpha 1: 0.3 g/dL (ref 0.0–0.4)
Alpha2 Glob SerPl Elph-Mcnc: 1 g/dL (ref 0.4–1.0)
B-Globulin SerPl Elph-Mcnc: 0.9 g/dL (ref 0.7–1.3)
Gamma Glob SerPl Elph-Mcnc: 0.3 g/dL — ABNORMAL LOW (ref 0.4–1.8)
Globulin, Total: 2.5 g/dL (ref 2.2–3.9)
IGM (IMMUNOGLOBIN M), SRM: 88 mg/dL (ref 20–172)
IgA, Qn, Serum: 60 mg/dL — ABNORMAL LOW (ref 61–437)
IgG, Qn, Serum: 282 mg/dL — ABNORMAL LOW (ref 700–1600)
TOTAL PROTEIN: 5.6 g/dL — AB (ref 6.0–8.5)

## 2015-11-24 NOTE — Telephone Encounter (Signed)
Patient called and let us know that his pending Medicaid reference # is [id number]. I informed Jose Garrett at [employer]. Eduard Clos stated that they didn't find a claim pending with that number with [place] when they investigated on 11/22/15. Jose Garrett told me they would do further investigation and call us if they had any further questions.  Raul Del, PharmD, BCPS, [facility] [phone]

## 2015-11-28 ENCOUNTER — Ambulatory Visit (HOSPITAL_BASED_OUTPATIENT_CLINIC_OR_DEPARTMENT_OTHER): Payer: Self-pay

## 2015-11-28 ENCOUNTER — Ambulatory Visit (HOSPITAL_BASED_OUTPATIENT_CLINIC_OR_DEPARTMENT_OTHER): Payer: Self-pay | Admitting: Nurse Practitioner

## 2015-11-28 ENCOUNTER — Ambulatory Visit: Payer: Self-pay

## 2015-11-28 VITALS — BP 152/65 | HR 71 | Temp 97.8°F | Resp 16

## 2015-11-28 VITALS — BP 167/59 | HR 80 | Temp 97.5°F | Resp 16 | Ht 69.0 in | Wt 205.4 lb

## 2015-11-28 DIAGNOSIS — D638 Anemia in other chronic diseases classified elsewhere: Secondary | ICD-10-CM

## 2015-11-28 DIAGNOSIS — D509 Iron deficiency anemia, unspecified: Secondary | ICD-10-CM

## 2015-11-28 DIAGNOSIS — S51801A Unspecified open wound of right forearm, initial encounter: Secondary | ICD-10-CM

## 2015-11-28 DIAGNOSIS — E858 Other amyloidosis: Secondary | ICD-10-CM

## 2015-11-28 DIAGNOSIS — S51811A Laceration without foreign body of right forearm, initial encounter: Secondary | ICD-10-CM

## 2015-11-28 DIAGNOSIS — E8581 Light chain (AL) amyloidosis: Secondary | ICD-10-CM

## 2015-11-28 MED ORDER — SODIUM CHLORIDE 0.9 % IV SOLN
510.0000 mg | Freq: Once | INTRAVENOUS | Status: AC
  Administered 2015-11-28: 510 mg via INTRAVENOUS
  Filled 2015-11-28: qty 17

## 2015-11-28 MED ORDER — OXYCODONE HCL 5 MG PO TABS: 5.0000 mg | ORAL_TABLET | ORAL | 0 refills | Status: DC | PRN

## 2015-11-28 MED ORDER — SODIUM CHLORIDE 0.9 % IV SOLN
Freq: Once | INTRAVENOUS | Status: AC
  Administered 2015-11-28: 15:00:00 via INTRAVENOUS

## 2015-11-28 NOTE — Patient Instructions (Signed)

## 2015-11-29 ENCOUNTER — Other Ambulatory Visit (HOSPITAL_BASED_OUTPATIENT_CLINIC_OR_DEPARTMENT_OTHER): Payer: Self-pay

## 2015-11-29 ENCOUNTER — Other Ambulatory Visit: Payer: Self-pay | Admitting: Nurse Practitioner

## 2015-11-29 ENCOUNTER — Ambulatory Visit (HOSPITAL_BASED_OUTPATIENT_CLINIC_OR_DEPARTMENT_OTHER): Payer: Self-pay | Admitting: Nurse Practitioner

## 2015-11-29 ENCOUNTER — Telehealth: Payer: Self-pay | Admitting: Nurse Practitioner

## 2015-11-29 ENCOUNTER — Encounter: Payer: Self-pay | Admitting: Nurse Practitioner

## 2015-11-29 ENCOUNTER — Encounter: Payer: Self-pay | Admitting: *Deleted

## 2015-11-29 VITALS — BP 170/53 | HR 75 | Temp 98.1°F | Resp 18 | Ht 69.0 in | Wt 205.6 lb

## 2015-11-29 DIAGNOSIS — D509 Iron deficiency anemia, unspecified: Secondary | ICD-10-CM

## 2015-11-29 DIAGNOSIS — S51801D Unspecified open wound of right forearm, subsequent encounter: Secondary | ICD-10-CM

## 2015-11-29 DIAGNOSIS — S51811A Laceration without foreign body of right forearm, initial encounter: Secondary | ICD-10-CM | POA: Insufficient documentation

## 2015-11-29 DIAGNOSIS — E8581 Light chain (AL) amyloidosis: Secondary | ICD-10-CM

## 2015-11-29 DIAGNOSIS — E858 Other amyloidosis: Secondary | ICD-10-CM

## 2015-11-29 DIAGNOSIS — S51811D Laceration without foreign body of right forearm, subsequent encounter: Secondary | ICD-10-CM

## 2015-11-29 LAB — CBC WITH DIFFERENTIAL/PLATELET
BASO%: 0.4 % (ref 0.0–2.0)
Basophils Absolute: 0 10*3/uL (ref 0.0–0.1)
EOS%: 6.2 % (ref 0.0–7.0)
Eosinophils Absolute: 0.3 10*3/uL (ref 0.0–0.5)
HCT: 27.4 % — ABNORMAL LOW (ref 38.4–49.9)
HEMOGLOBIN: 9 g/dL — AB (ref 13.0–17.1)
LYMPH#: 0.9 10*3/uL (ref 0.9–3.3)
LYMPH%: 19.1 % (ref 14.0–49.0)
MCH: 28.8 pg (ref 27.2–33.4)
MCHC: 32.8 g/dL (ref 32.0–36.0)
MCV: 87.5 fL (ref 79.3–98.0)
MONO#: 0.6 10*3/uL (ref 0.1–0.9)
MONO%: 12.7 % (ref 0.0–14.0)
NEUT#: 3 10*3/uL (ref 1.5–6.5)
NEUT%: 61.6 % (ref 39.0–75.0)
Platelets: 115 10*3/uL — ABNORMAL LOW (ref 140–400)
RBC: 3.13 10*6/uL — AB (ref 4.20–5.82)
RDW: 16.3 % — ABNORMAL HIGH (ref 11.0–14.6)
WBC: 4.8 10*3/uL (ref 4.0–10.3)
nRBC: 0 % (ref 0–0)

## 2015-11-29 MED ORDER — IXAZOMIB CITRATE 3 MG PO CAPS: 3.0000 mg | ORAL_CAPSULE | ORAL | 2 refills | Status: DC

## 2015-11-29 NOTE — Assessment & Plan Note (Signed)
Present history of chronic iron deficiency anemia.  He presented to the [facility] today to receive his next Feraheme infusion.  He is scheduled to return for his next infusion on 12/05/2015.  He is scheduled for labs and a follow-up visit on 12/20/2015.

## 2015-11-29 NOTE — Progress Notes (Signed)
SYMPTOM MANAGEMENT CLINIC    Chief Complaint: Right arm skin tear  HPI:  Jose Garrett 65 y.o. male diagnosed with amyloidosis and iron deficiency anemia.  Currently undergoing Ninlaro oral therapy and Feraheme infusions.    No history exists.    Review of Systems  Constitutional: Positive for malaise/fatigue.  Skin:       Right arm skin tear  All other systems reviewed and are negative.   Past Medical History:  Diagnosis Date  . Anemia Dx 2016  . Chronic diastolic CHF (congestive heart failure), NYHA class 1 ([name])    /hospital problem list 09/08/2014  . Chronic renal disease, stage III    /hospital problem list 09/08/2014  . Enlarged heart 2015  . Family history of adverse reaction to anesthesia    "my daughter can't take certain anesthesia agents" (09/08/2014)  . History of blood transfusion 09/08/2014   "got hematoma after renal biopsy & HgB dropped"  . Hyperlipidemia Dx 2012  . Hypertension Dx 2012  . Renal disorder    "there is a spot on my kidney" (09/08/2014)    Past Surgical History:  Procedure Laterality Date  . LAPAROSCOPIC CHOLECYSTECTOMY  03/2011  . RENAL BIOPSY, PERCUTANEOUS Right 09/08/2014  . TONSILLECTOMY  ~ 1960    has Essential hypertension, benign; Dependent edema; DOE (dyspnea on exertion); Other malaise and fatigue; Vitamin D deficiency; Hypertriglyceridemia; Hematuria; Proteinuria; Renal hematoma, right; Chronic diastolic heart failure, NYHA class 1 ([name]); AL amyloidosis ([name]); Anemia of chronic disease; Abnormal bruising; Hip pain; Back pain; Seasonal allergies; B12 deficiency; CKD (chronic kidney disease) stage 4, GFR 15-29 ml/min ([name]); Mild aortic stenosis; Iron deficiency anemia; and Skin tear of right forearm without complication on his problem list.    has No Known Allergies.    Medication List       Accurate as of 11/28/15 11:59 PM. Always use your most recent med list.          acyclovir 400 MG tablet Commonly known as:   ZOVIRAX TAKE 1 TABLET BY MOUTH DAILY. (DOSE ADJUSTED FOR RENAL FUNCTION)   amLODipine 10 MG tablet Commonly known as:  NORVASC Take 1 tablet (10 mg total) by mouth daily.   aspirin EC 81 MG tablet Take 1 tablet (81 mg total) by mouth daily.   B-12 1000 MCG Subl Place 1,000 mcg under the tongue daily.   carvedilol 25 MG tablet Commonly known as:  COREG Take 1 tablet (25 mg total) by mouth 2 (two) times daily with a meal.   cetirizine 5 MG tablet Commonly known as:  ZYRTEC Take 1 tablet (5 mg total) by mouth daily.   dexamethasone 4 MG tablet Commonly known as:  DECADRON Take 5 tablets (20 mg total) by mouth once every week.   FLONASE 50 MCG/ACT nasal spray Generic drug:  fluticasone Place 1 spray into both nostrils daily.   furosemide 40 MG tablet Commonly known as:  LASIX Take 1 tablet (40 mg total) by mouth 3 (three) times daily.   gabapentin 250 MG/5ML solution Commonly known as:  NEURONTIN Take 6 mLs (300 mg total) by mouth at bedtime. May increased to 415m po HS if still having neuropathic pain   hydrALAZINE 100 MG tablet Commonly known as:  APRESOLINE Take 1 tablet (100 mg total) by mouth 3 (three) times daily.   ixazomib citrate 3 MG capsule Commonly known as:  NINLARO Take 1 capsule (3 mg total) by mouth once a week. (on D1,8 and 15 every 28 days)Take  on an empty stomach 1hr before or 2hrs after food. Do not crush, chew, or open.   LORazepam 1 MG tablet Commonly known as:  ATIVAN Take 1 tablet (1 mg total) by mouth every 8 (eight) hours as needed for anxiety (or nausea).   ondansetron 8 MG tablet Commonly known as:  ZOFRAN Take 8 mg by mouth every 8 (eight) hours as needed for nausea or vomiting. Reported on 07/08/2015   oxyCODONE 5 MG immediate release tablet Commonly known as:  Oxy IR/ROXICODONE Take 1 tablet (5 mg total) by mouth every 4 (four) hours as needed for severe pain.   vitamin E 100 UNIT capsule Take 100 Units by mouth every other day.  Reported on 07/08/2015        PHYSICAL EXAMINATION  Oncology Vitals 11/28/2015 11/28/2015  Height - 175 cm  Weight - 93.169 kg  Weight (lbs) - 205 lbs 6 oz  BMI (kg/m2) - 30.33 kg/m2  Temp 97.8 97.5  Pulse 71 80  Resp 16 16  SpO2 98 98  BSA (m2) - 2.13 m2   BP Readings from Last 2 Encounters:  11/28/15 (!) 152/65  11/28/15 (!) 167/59    Physical Exam  Constitutional: He is oriented to person, place, and time and well-developed, well-nourished, and in no distress.  HENT:  Head: Normocephalic and atraumatic.  Eyes: Conjunctivae and EOM are normal. Pupils are equal, round, and reactive to light.  Neck: Normal range of motion.  Pulmonary/Chest: Effort normal. No respiratory distress.  Musculoskeletal: Normal range of motion. He exhibits tenderness. He exhibits no edema.  Neurological: He is alert and oriented to person, place, and time. Gait normal.  Skin: Skin is warm and dry.  Exam today reveals a fairly large, approximately 4-5 cm in diameter skin tear to the right forearm.  It does appear to be continually oozing some blood as well.  Dear he is fairly tender.    Psychiatric: Affect normal.  Nursing note and vitals reviewed.   LABORATORY DATA:. No visits with results within 3 Day(s) from this visit.  Latest known visit with results is:  Appointment on 11/22/2015  Component Date Value Ref Range Status  . WBC 11/22/2015 7.2  4.0 - 10.3 10e3/uL Final  . NEUT# 11/22/2015 5.1  1.5 - 6.5 10e3/uL Final  . HGB 11/22/2015 9.3* 13.0 - 17.1 g/dL Final  . HCT 11/22/2015 28.6* 38.4 - 49.9 % Final  . Platelets 11/22/2015 123* 140 - 400 10e3/uL Final  . MCV 11/22/2015 88.3  79.3 - 98.0 fL Final  . MCH 11/22/2015 28.7  27.2 - 33.4 pg Final  . MCHC 11/22/2015 32.5  32.0 - 36.0 g/dL Final  . RBC 11/22/2015 3.24* 4.20 - 5.82 10e6/uL Final  . RDW 11/22/2015 16.1* 11.0 - 14.6 % Final  . lymph# 11/22/2015 0.9  0.9 - 3.3 10e3/uL Final  . MONO# 11/22/2015 0.7  0.1 - 0.9 10e3/uL Final  .  Eosinophils Absolute 11/22/2015 0.4  0.0 - 0.5 10e3/uL Final  . Basophils Absolute 11/22/2015 0.0  0.0 - 0.1 10e3/uL Final  . NEUT% 11/22/2015 71.1  39.0 - 75.0 % Final  . LYMPH% 11/22/2015 12.9* 14.0 - 49.0 % Final  . MONO% 11/22/2015 9.6  0.0 - 14.0 % Final  . EOS% 11/22/2015 6.0  0.0 - 7.0 % Final  . BASO% 11/22/2015 0.4  0.0 - 2.0 % Final  . Retic % 11/22/2015 2.14* 0.80 - 1.80 % Final  . Retic Ct Abs 11/22/2015 69.34  34.80 - 93.90 10e3/uL  Final  . Immature Retic Fract 11/22/2015 19.00* 3.00 - 10.60 % Final  . Sodium 11/22/2015 139  136 - 145 mEq/L Final  . Potassium 11/22/2015 3.7  3.5 - 5.1 mEq/L Final  . Chloride 11/22/2015 110* 98 - 109 mEq/L Final  . CO2 11/22/2015 17* 22 - 29 mEq/L Final  . Glucose 11/22/2015 173* 70 - 140 mg/dl Final  . BUN 11/22/2015 34.7* 7.0 - 26.0 mg/dL Final  . Creatinine 11/22/2015 4.3* 0.7 - 1.3 mg/dL Final  . Total Bilirubin 11/22/2015 0.41  0.20 - 1.20 mg/dL Final  . Alkaline Phosphatase 11/22/2015 68  40 - 150 U/L Final  . AST 11/22/2015 14  5 - 34 U/L Final  . ALT 11/22/2015 16  0 - 55 U/L Final  . Total Protein 11/22/2015 6.3* 6.4 - 8.3 g/dL Final  . Albumin 11/22/2015 3.0* 3.5 - 5.0 g/dL Final  . Calcium 11/22/2015 8.6  8.4 - 10.4 mg/dL Final  . Anion Gap 11/22/2015 12* 3 - 11 mEq/L Final  . EGFR 11/22/2015 14* >90 ml/min/1.73 m2 Final  . IgG, Qn, Serum 11/24/2015 282* 700 - 1,600 mg/dL Final  . IgA, Qn, Serum 11/24/2015 60* 61 - 437 mg/dL Final  . IgM, Qn, Serum 11/24/2015 88  20 - 172 mg/dL Final  . Total Protein 11/24/2015 5.6* 6.0 - 8.5 g/dL Final  . Albumin SerPl Elph-Mcnc 11/24/2015 3.1  2.9 - 4.4 g/dL Final  . Alpha 1 11/24/2015 0.3  0.0 - 0.4 g/dL Final  . Alpha2 Glob SerPl Elph-Mcnc 11/24/2015 1.0  0.4 - 1.0 g/dL Final  . B-Globulin SerPl Elph-Mcnc 11/24/2015 0.9  0.7 - 1.3 g/dL Final  . Gamma Glob SerPl Elph-Mcnc 11/24/2015 0.3* 0.4 - 1.8 g/dL Final  . M Protein SerPl Elph-Mcnc 11/24/2015 Not Observed  Not Observed g/dL Final  .  Globulin, Total 11/24/2015 2.5  2.2 - 3.9 g/dL Final  . Albumin/Glob SerPl 11/24/2015 1.3  0.7 - 1.7 Final  . IFE 1 11/24/2015 Comment   Final  . Please Note 11/24/2015 Comment   Final   Comment: Protein electrophoresis scan will follow via computer, mail, or courier delivery.   Loletha Grayer Kappa Free Light Chain 11/23/2015 36.6* 3.3 - 19.4 mg/L Final  . Ig Lambda Free Light Chain 11/23/2015 34.8* 5.7 - 26.3 mg/L Final  . Kappa/Lambda FluidC Ratio 11/23/2015 1.05  0.26 - 1.65 Final  . Technologist Review 11/22/2015 Metas and Myelocytes present   Final   Right forearm:       RADIOGRAPHIC STUDIES: No results found.  ASSESSMENT/PLAN:    Skin tear of right forearm without complication Patient states that he gained his right forearm into a storm door at his house last night; and developed a fairly significant skin tear.  He states that the wound continue to ooze blood despite changing the dressing frequently last night.  He states that the arm is also is fairly painful.  Exam today reveals a fairly large, approximately 4-5 cm in diameter skin tear to the right forearm.  It does appear to be continually oozing some blood as well.  Dear he is fairly tender.  This provider copiously flushed the area and radiated 1 small area of the wound where there was a skin flap.  Vaseline gauze dressings were applied; and then a Kerlix dressing was applied over the Vaseline gauze.  Patient's arm continued to ooze blood; so dressing was changed.  Once again later that afternoon; and a pressure dressing was applied at that time with Coban  dressing.  Patient was advised to remove the pressure dressing within the next 6-8 hours.  He was also given new dressing supplies to take home; with specific instructions on how to address his arm.  He was advised to call/return or go directly to the emergency department for any worsening symptoms whatsoever.    Iron deficiency anemia Present history of chronic iron deficiency  anemia.  He presented to the [facility] today to receive his next Feraheme infusion.  He is scheduled to return for his next infusion on 12/05/2015.  He is scheduled for labs and a follow-up visit on 12/20/2015.  AL amyloidosis ([place]) Patient is currently undergoing Ninlaro therapy as directed for his previously diagnosed amyloidosis.  Patient stated that he needed a refill of his Kennieth Rad this provider reviewed this request with Dr. [name]; and he ensure that patient would have his oral therapy meds refilled.    Patient stated understanding of all instructions; and was in agreement with this plan of care. The patient knows to call the clinic with any problems, questions or concerns.   Total time spent with patient was 40 minutes;  with greater than 75 percent of that time spent in face to face counseling regarding patient's symptoms,  and coordination of care and follow up.  Disclaimer:This dictation was prepared with Dragon/digital dictation along with [employer]. Any transcriptional errors that result from this process are unintentional.  Drue Second, NP 11/29/2015

## 2015-11-29 NOTE — Telephone Encounter (Signed)
Called patient to check in with him today regarding the right forearm skin tear.  Patient states that the oozing of blood.  Continued overnight; and he would prefer to return to the [facility] today for further evaluation and redressing of the wound.  Patient is on his way into the [facility] now.

## 2015-11-29 NOTE — Assessment & Plan Note (Signed)
Patient states that he gained his right forearm into a storm door at his house last night; and developed a fairly significant skin tear.  He states that the wound continue to ooze blood despite changing the dressing frequently last night.  He states that the arm is also is fairly painful.  Exam today reveals a fairly large, approximately 4-5 cm in diameter skin tear to the right forearm.  It does appear to be continually oozing some blood as well.  Dear he is fairly tender.  This provider copiously flushed the area and radiated 1 small area of the wound where there was a skin flap.  Vaseline gauze dressings were applied; and then a Kerlix dressing was applied over the Vaseline gauze.  Patient's arm continued to ooze blood; so dressing was changed.  Once again later that afternoon; and a pressure dressing was applied at that time with Coban dressing.  Patient was advised to remove the pressure dressing within the next 6-8 hours.  He was also given new dressing supplies to take home; with specific instructions on how to address his arm.  He was advised to call/return or go directly to the emergency department for any worsening symptoms whatsoever.

## 2015-11-29 NOTE — Progress Notes (Signed)
Ninlaro prescription faxed to [phone]

## 2015-11-29 NOTE — Assessment & Plan Note (Signed)
Patient is currently undergoing Ninlaro therapy as directed for his previously diagnosed amyloidosis.  Patient stated that he needed a refill of his Kennieth Rad this provider reviewed this request with Dr. [name]; and he ensure that patient would have his oral therapy meds refilled.

## 2015-11-30 ENCOUNTER — Encounter: Payer: Self-pay | Admitting: Nurse Practitioner

## 2015-11-30 NOTE — Assessment & Plan Note (Addendum)
Patient states that his right forearm skin tear to need to ooze blood overnite and saturate his bandage.  He has changed the dressing twice in the left the cancer [facility] yesterday.  He is requesting to return to the [facility] for further evaluation and to re-dress the wound.   Exam today reveals that the right forearm skin tear has greatly improved since last examined yesterday.  It still appears moist; but is no longer oozing blood.  Neulasta and gauze dressing was applied.   Confirmed that patient still has plenty of dressing, changing materials at home. he will return on Friday, 12/02/2015 for recheck of his arm.  [place] in the meantime with new worries or concerns whatsoever.

## 2015-11-30 NOTE — Progress Notes (Signed)
SYMPTOM MANAGEMENT CLINIC    Chief Complaint: Skin tear  HPI:  Jose Garrett 65 y.o. male diagnosed with iron deficiency anemia and amyloidosis.  Currently undergoing Ninlaro oral therapy and Feraheme on an as-needed basis.    No history exists.    Review of Systems  Skin:       Skin tear to right forearm  All other systems reviewed and are negative.   Past Medical History:  Diagnosis Date  . Anemia Dx 2016  . Chronic diastolic CHF (congestive heart failure), NYHA class 1 ([place])    /hospital problem list 09/08/2014  . Chronic renal disease, stage III    /hospital problem list 09/08/2014  . Enlarged heart 2015  . Family history of adverse reaction to anesthesia    "my daughter can't take certain anesthesia agents" (09/08/2014)  . History of blood transfusion 09/08/2014   "got hematoma after renal biopsy & HgB dropped"  . Hyperlipidemia Dx 2012  . Hypertension Dx 2012  . Renal disorder    "there is a spot on my kidney" (09/08/2014)    Past Surgical History:  Procedure Laterality Date  . LAPAROSCOPIC CHOLECYSTECTOMY  03/2011  . RENAL BIOPSY, PERCUTANEOUS Right 09/08/2014  . TONSILLECTOMY  ~ 1960    has Essential hypertension, benign; Dependent edema; DOE (dyspnea on exertion); Other malaise and fatigue; Vitamin D deficiency; Hypertriglyceridemia; Hematuria; Proteinuria; Renal hematoma, right; Chronic diastolic heart failure, NYHA class 1 ([place]); AL amyloidosis ([place]); Anemia of chronic disease; Abnormal bruising; Hip pain; Back pain; Seasonal allergies; B12 deficiency; CKD (chronic kidney disease) stage 4, GFR 15-29 ml/min ([place]); Mild aortic stenosis; Iron deficiency anemia; and Skin tear of right forearm without complication on his problem list.    has No Known Allergies.    Medication List       Accurate as of 11/29/15 11:59 PM. Always use your most recent med list.          acyclovir 400 MG tablet Commonly known as:  ZOVIRAX TAKE 1 TABLET BY MOUTH DAILY. (DOSE  ADJUSTED FOR RENAL FUNCTION)   amLODipine 10 MG tablet Commonly known as:  NORVASC Take 1 tablet (10 mg total) by mouth daily.   aspirin EC 81 MG tablet Take 1 tablet (81 mg total) by mouth daily.   B-12 1000 MCG Subl Place 1,000 mcg under the tongue daily.   carvedilol 25 MG tablet Commonly known as:  COREG Take 1 tablet (25 mg total) by mouth 2 (two) times daily with a meal.   cetirizine 5 MG tablet Commonly known as:  ZYRTEC Take 1 tablet (5 mg total) by mouth daily.   dexamethasone 4 MG tablet Commonly known as:  DECADRON Take 5 tablets (20 mg total) by mouth once every week.   FLONASE 50 MCG/ACT nasal spray Generic drug:  fluticasone Place 1 spray into both nostrils daily.   furosemide 40 MG tablet Commonly known as:  LASIX Take 1 tablet (40 mg total) by mouth 3 (three) times daily.   gabapentin 250 MG/5ML solution Commonly known as:  NEURONTIN Take 6 mLs (300 mg total) by mouth at bedtime. May increased to 400mg  po HS if still having neuropathic pain   hydrALAZINE 100 MG tablet Commonly known as:  APRESOLINE Take 1 tablet (100 mg total) by mouth 3 (three) times daily.   ixazomib citrate 3 MG capsule Commonly known as:  NINLARO Take 1 capsule (3 mg total) by mouth once a week. (on D1,8 and 15 every 28 days)Take on an empty  stomach 1hr before or 2hrs after food. Do not crush, chew, or open.   LORazepam 1 MG tablet Commonly known as:  ATIVAN Take 1 tablet (1 mg total) by mouth every 8 (eight) hours as needed for anxiety (or nausea).   ondansetron 8 MG tablet Commonly known as:  ZOFRAN Take 8 mg by mouth every 8 (eight) hours as needed for nausea or vomiting. Reported on 07/08/2015   oxyCODONE 5 MG immediate release tablet Commonly known as:  Oxy IR/ROXICODONE Take 1 tablet (5 mg total) by mouth every 4 (four) hours as needed for severe pain.   vitamin E 100 UNIT capsule Take 100 Units by mouth every other day. Reported on 07/08/2015        PHYSICAL  EXAMINATION  Oncology Vitals 11/29/2015 11/28/2015  Height 175 cm -  Weight 93.26 kg -  Weight (lbs) 205 lbs 10 oz -  BMI (kg/m2) 30.36 kg/m2 -  Temp 98.1 97.8  Pulse 75 71  Resp 18 16  SpO2 94 98  BSA (m2) 2.13 m2 -   BP Readings from Last 2 Encounters:  11/29/15 (!) 170/53  11/28/15 (!) 152/65    Physical Exam  Constitutional: He is well-developed, well-nourished, and in no distress.  HENT:  Head: Normocephalic and atraumatic.  Eyes: Conjunctivae and EOM are normal. Pupils are equal, round, and reactive to light.  Neck: Normal range of motion.  Pulmonary/Chest: Effort normal. No respiratory distress.  Musculoskeletal: Normal range of motion.  Skin: Skin is warm and dry.  Exam today reveals that the right forearm skin tear has greatly improved since last examined yesterday.  It still appears moist; but is no longer oozing blood.    Psychiatric: Affect normal.  Nursing note and vitals reviewed.   LABORATORY DATA:. Appointment on 11/29/2015  Component Date Value Ref Range Status  . WBC 11/29/2015 4.8  4.0 - 10.3 10e3/uL Final  . NEUT# 11/29/2015 3.0  1.5 - 6.5 10e3/uL Final  . HGB 11/29/2015 9.0* 13.0 - 17.1 g/dL Final  . HCT 11/29/2015 27.4* 38.4 - 49.9 % Final  . Platelets 11/29/2015 115* 140 - 400 10e3/uL Final  . MCV 11/29/2015 87.5  79.3 - 98.0 fL Final  . MCH 11/29/2015 28.8  27.2 - 33.4 pg Final  . MCHC 11/29/2015 32.8  32.0 - 36.0 g/dL Final  . RBC 11/29/2015 3.13* 4.20 - 5.82 10e6/uL Final  . RDW 11/29/2015 16.3* 11.0 - 14.6 % Final  . lymph# 11/29/2015 0.9  0.9 - 3.3 10e3/uL Final  . MONO# 11/29/2015 0.6  0.1 - 0.9 10e3/uL Final  . Eosinophils Absolute 11/29/2015 0.3  0.0 - 0.5 10e3/uL Final  . Basophils Absolute 11/29/2015 0.0  0.0 - 0.1 10e3/uL Final  . NEUT% 11/29/2015 61.6  39.0 - 75.0 % Final  . LYMPH% 11/29/2015 19.1  14.0 - 49.0 % Final  . MONO% 11/29/2015 12.7  0.0 - 14.0 % Final  . EOS% 11/29/2015 6.2  0.0 - 7.0 % Final  . BASO% 11/29/2015 0.4   0.0 - 2.0 % Final  . nRBC 11/29/2015 0  0 - 0 % Final      RADIOGRAPHIC STUDIES: No results found.  ASSESSMENT/PLAN:    Skin tear of right forearm without complication Patient states that his right forearm skin tear to need to ooze blood overnite and saturate his bandage.  He has changed the dressing twice in the left the cancer Center yesterday.  He is requesting to return to the [facility] for further evaluation and  to re-dress the wound.   Exam today reveals that the right forearm skin tear has greatly improved since last examined yesterday.  It still appears moist; but is no longer oozing blood.  Neulasta and gauze dressing was applied.   Confirmed that patient still has plenty of dressing, changing materials at home. he will return on Friday, 12/02/2015 for recheck of his arm.  [place] in the meantime with new worries or concerns whatsoever.     Patient stated understanding of all instructions; and was in agreement with this plan of care. The patient knows to call the clinic with any problems, questions or concerns.   Total time spent with patient was 15 minutes;  with greater than 75 percent of that time spent in face to face counseling regarding patient's symptoms,  and coordination of care and follow up.  Disclaimer:This dictation was prepared with Dragon/digital dictation along with [employer]. Any transcriptional errors that result from this process are unintentional.  Drue Second, NP 11/30/2015

## 2015-12-02 ENCOUNTER — Encounter (HOSPITAL_COMMUNITY)
Admission: RE | Admit: 2015-12-02 | Discharge: 2015-12-02 | Disposition: A | Discharge status: Home / Self Care | Payer: Self-pay | Source: Ambulatory Visit | Attending: Nephrology | Admitting: Nephrology

## 2015-12-02 ENCOUNTER — Telehealth: Payer: Self-pay | Admitting: Hematology

## 2015-12-02 ENCOUNTER — Encounter: Payer: Self-pay | Admitting: Nurse Practitioner

## 2015-12-02 ENCOUNTER — Ambulatory Visit (HOSPITAL_BASED_OUTPATIENT_CLINIC_OR_DEPARTMENT_OTHER): Payer: Self-pay | Admitting: Nurse Practitioner

## 2015-12-02 VITALS — BP 153/50 | HR 75 | Temp 98.3°F | Resp 18 | Ht 69.0 in | Wt 209.0 lb

## 2015-12-02 DIAGNOSIS — S51801D Unspecified open wound of right forearm, subsequent encounter: Secondary | ICD-10-CM

## 2015-12-02 DIAGNOSIS — S51811D Laceration without foreign body of right forearm, subsequent encounter: Secondary | ICD-10-CM

## 2015-12-02 DIAGNOSIS — E858 Other amyloidosis: Secondary | ICD-10-CM | POA: Diagnosis not present

## 2015-12-02 DIAGNOSIS — D638 Anemia in other chronic diseases classified elsewhere: Secondary | ICD-10-CM

## 2015-12-02 LAB — IRON AND TIBC
Iron: 102 ug/dL (ref 45–182)
Saturation Ratios: 36 % (ref 17.9–39.5)
TIBC: 283 ug/dL (ref 250–450)
UIBC: 181 ug/dL

## 2015-12-02 LAB — POCT HEMOGLOBIN-HEMACUE: Hemoglobin: 9.1 g/dL — ABNORMAL LOW (ref 13.0–17.0)

## 2015-12-02 LAB — FERRITIN: FERRITIN: 284 ng/mL (ref 24–336)

## 2015-12-02 MED ORDER — EPOETIN ALFA 20000 UNIT/ML IJ SOLN
INTRAMUSCULAR | Status: AC
  Filled 2015-12-02: qty 1

## 2015-12-02 MED ORDER — EPOETIN ALFA 20000 UNIT/ML IJ SOLN
20000.0000 [IU] | INTRAMUSCULAR | Status: DC
  Administered 2015-12-02: 20000 [IU] via SUBCUTANEOUS

## 2015-12-02 NOTE — Telephone Encounter (Signed)
NO 9/22 LOS/ORDERS/REFERRALS. PATIENT HAS EXISTING APPOINTMENTS ON SCHEDULE.

## 2015-12-02 NOTE — Progress Notes (Signed)
SYMPTOM MANAGEMENT CLINIC    Chief Complaint: Skin tear  HPI:  Jose Garrett 65 y.o. male diagnosed with iron deficiency anemia and amyloidosis.  Currently undergoing Ninlaro oral therapy and Feraheme on an as-needed basis.    No history exists.    Review of Systems  Skin:       Skin tear to right forearm  All other systems reviewed and are negative.   Past Medical History:  Diagnosis Date  . Anemia Dx 2016  . Chronic diastolic CHF (congestive heart failure), NYHA class 1 ([place])    /hospital problem list 09/08/2014  . Chronic renal disease, stage III    /hospital problem list 09/08/2014  . Enlarged heart 2015  . Family history of adverse reaction to anesthesia    "my daughter can't take certain anesthesia agents" (09/08/2014)  . History of blood transfusion 09/08/2014   "got hematoma after renal biopsy & HgB dropped"  . Hyperlipidemia Dx 2012  . Hypertension Dx 2012  . Renal disorder    "there is a spot on my kidney" (09/08/2014)    Past Surgical History:  Procedure Laterality Date  . LAPAROSCOPIC CHOLECYSTECTOMY  03/2011  . RENAL BIOPSY, PERCUTANEOUS Right 09/08/2014  . TONSILLECTOMY  ~ 1960    has Essential hypertension, benign; Dependent edema; DOE (dyspnea on exertion); Other malaise and fatigue; Vitamin D deficiency; Hypertriglyceridemia; Hematuria; Proteinuria; Renal hematoma, right; Chronic diastolic heart failure, NYHA class 1 ([place]); AL amyloidosis ([place]); Anemia of chronic disease; Abnormal bruising; Hip pain; Back pain; Seasonal allergies; B12 deficiency; CKD (chronic kidney disease) stage 4, GFR 15-29 ml/min ([place]); Mild aortic stenosis; Iron deficiency anemia; and Skin tear of right forearm without complication on his problem list.    has No Known Allergies.    Medication List       Accurate as of 12/02/15  6:49 PM. Always use your most recent med list.          acyclovir 400 MG tablet Commonly known as:  ZOVIRAX TAKE 1 TABLET BY MOUTH DAILY. (DOSE  ADJUSTED FOR RENAL FUNCTION)   amLODipine 10 MG tablet Commonly known as:  NORVASC Take 1 tablet (10 mg total) by mouth daily.   aspirin EC 81 MG tablet Take 1 tablet (81 mg total) by mouth daily.   B-12 1000 MCG Subl Place 1,000 mcg under the tongue daily.   carvedilol 25 MG tablet Commonly known as:  COREG Take 1 tablet (25 mg total) by mouth 2 (two) times daily with a meal.   cetirizine 5 MG tablet Commonly known as:  ZYRTEC Take 1 tablet (5 mg total) by mouth daily.   dexamethasone 4 MG tablet Commonly known as:  DECADRON Take 5 tablets (20 mg total) by mouth once every week.   FLONASE 50 MCG/ACT nasal spray Generic drug:  fluticasone Place 1 spray into both nostrils daily.   furosemide 40 MG tablet Commonly known as:  LASIX Take 1 tablet (40 mg total) by mouth 3 (three) times daily.   gabapentin 250 MG/5ML solution Commonly known as:  NEURONTIN Take 6 mLs (300 mg total) by mouth at bedtime. May increased to 400mg  po HS if still having neuropathic pain   hydrALAZINE 100 MG tablet Commonly known as:  APRESOLINE Take 1 tablet (100 mg total) by mouth 3 (three) times daily.   ixazomib citrate 3 MG capsule Commonly known as:  NINLARO Take 1 capsule (3 mg total) by mouth once a week. (on D1,8 and 15 every 28 days)Take on an  empty stomach 1hr before or 2hrs after food. Do not crush, chew, or open.   LORazepam 1 MG tablet Commonly known as:  ATIVAN Take 1 tablet (1 mg total) by mouth every 8 (eight) hours as needed for anxiety (or nausea).   ondansetron 8 MG tablet Commonly known as:  ZOFRAN Take 8 mg by mouth every 8 (eight) hours as needed for nausea or vomiting. Reported on 07/08/2015   oxyCODONE 5 MG immediate release tablet Commonly known as:  Oxy IR/ROXICODONE Take 1 tablet (5 mg total) by mouth every 4 (four) hours as needed for severe pain.   vitamin E 100 UNIT capsule Take 100 Units by mouth every other day. Reported on 07/08/2015        PHYSICAL  EXAMINATION  Oncology Vitals 12/02/2015 12/02/2015  Height 175 cm -  Weight 94.802 kg -  Weight (lbs) 209 lbs -  BMI (kg/m2) 30.86 kg/m2 -  Temp 98.3 97.5  Pulse 75 79  Resp 18 18  SpO2 97 99  BSA (m2) 2.15 m2 -   BP Readings from Last 2 Encounters:  12/02/15 (!) 153/50  12/02/15 (!) 131/56    Physical Exam  Constitutional: He is well-developed, well-nourished, and in no distress.  HENT:  Head: Normocephalic and atraumatic.  Eyes: Conjunctivae and EOM are normal. Pupils are equal, round, and reactive to light.  Neck: Normal range of motion.  Pulmonary/Chest: Effort normal. No respiratory distress.  Musculoskeletal: Normal range of motion.  Skin: Skin is warm and dry.  Exam today reveals that the right forearm skin tear has greatly improved since last examined yesterday.  It still appears moist; but is no longer oozing blood.    Psychiatric: Affect normal.  Nursing note and vitals reviewed.   LABORATORY DATA:. Hospital Outpatient Visit on 12/02/2015  Component Date Value Ref Range Status  . Iron 12/02/2015 102  45 - 182 ug/dL Final  . TIBC 12/02/2015 283  250 - 450 ug/dL Final  . Saturation Ratios 12/02/2015 36  17.9 - 39.5 % Final  . UIBC 12/02/2015 181  ug/dL Final  . Ferritin 12/02/2015 284  24 - 336 ng/mL Final  . Hemoglobin 12/02/2015 9.1* 13.0 - 17.0 g/dL Final         RADIOGRAPHIC STUDIES: No results found.  ASSESSMENT/PLAN:    Skin tear of right forearm without complication Patient states that his right forearm skin tear to need to ooze blood overnite and saturate his bandage.  He has changed the dressing twice in the left the cancer Center yesterday.  He is requesting to return to the [facility] for further evaluation and to re-dress the wound.   Exam today reveals that the right forearm skin tear has greatly improved since last examined yesterday.  It still appears moist; but is no longer oozing blood.  New gauze dressing was applied.   Confirmed  that patient still has plenty of dressing, changing materials at home. he will return on Friday, 12/02/2015 for recheck of his arm.  [place] in the meantime with new worries or concerns whatsoever.   ___________________________________  Update: Patient returns for a recheck of his right arm.  He denies any new issues whatsoever.  Exam today revealed slowly healing right forearm skin tear.  Dressing was removed; and new dressing applied.  He was advised to call/return for any worsening symptoms whatsoever.   Patient stated understanding of all instructions; and was in agreement with this plan of care. The patient knows to call the clinic with  any problems, questions or concerns.   Total time spent with patient was 15 minutes;  with greater than 75 percent of that time spent in face to face counseling regarding patient's symptoms,  and coordination of care and follow up.  Disclaimer:This dictation was prepared with Dragon/digital dictation along with [employer]. Any transcriptional errors that result from this process are unintentional.  Drue Second, NP 12/02/2015

## 2015-12-02 NOTE — Assessment & Plan Note (Signed)
Patient states that his right forearm skin tear to need to ooze blood overnite and saturate his bandage.  He has changed the dressing twice in the left the cancer Center yesterday.  He is requesting to return to the [facility] for further evaluation and to re-dress the wound.   Exam today reveals that the right forearm skin tear has greatly improved since last examined yesterday.  It still appears moist; but is no longer oozing blood.  New gauze dressing was applied.   Confirmed that patient still has plenty of dressing, changing materials at home. he will return on Friday, 12/02/2015 for recheck of his arm.  [place] in the meantime with new worries or concerns whatsoever.   ___________________________________  Update: Patient returns for a recheck of his right arm.  He denies any new issues whatsoever.  Exam today revealed slowly healing right forearm skin tear.  Dressing was removed; and new dressing applied.  He was advised to call/return for any worsening symptoms whatsoever.

## 2015-12-03 LAB — PTH, INTACT AND CALCIUM
CALCIUM TOTAL (PTH): 8.4 mg/dL — AB (ref 8.6–10.2)
PTH: 141 pg/mL — AB (ref 15–65)

## 2015-12-04 NOTE — Progress Notes (Signed)
.    Hematology oncology clinic note  Date of service  .11/22/2015  Patient Care Team: Jose Nearing, MD as PCP - General (Family Medicine) Jose Genera, MD as PCP - Hematology/Oncology (Hematology and Oncology) Jose Parish, MD as Consulting Physician (Nephrology)  CHIEF COMPLAINTS: followup for AL amyloidosis.  HISTORY OF PRESENTING ILLNESS: Please see my initial consultation for details on presentation.  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 AL amyloidosis noted on kidney biopsy diagnosed 09/28/2014.  Had about 15 g of protein per 24 hours which produced a less than 50% suggesting renal response to treatment. - no overt heart failure   Current treatment Ixazomib + Dexamethasone s/p 6 cycles   previous treatment  CyBorD x 6 cycles Evaluated for and declined Auto HSCT at [facility] hospital. - maintenance Velcade  INTERVAL HISTORY  [name] is here for his scheduled follow-up for AL amyloidosis. He has completed 6 cycles of Ixazomib + Dexamethasone. He notes some easy bruisability likely due to steroid use. He continues to be in hematologic remission and we discontinued his dexamethasone. He has now been switched to maintenance Ixazomib.  Notes that his neuropathy does not appear any worse and is controlled with his gabapentin. Noted to be low on iron with a ferritin of 58 and the goal is more than 100 with ongoing ESA treatment. Verbal consent obtained and he was set up for IV iron. No other acute new symptoms.  MEDICAL HISTORY:  Past Medical History:  Diagnosis Date  . Anemia Dx 2016  . Chronic diastolic CHF (congestive heart failure), NYHA class 1 ([place])    /hospital problem list 09/08/2014  . Chronic renal disease, stage III    /hospital problem list 09/08/2014  . Enlarged heart 2015  . Family history of adverse reaction to anesthesia    "my daughter can't take certain anesthesia agents"  (09/08/2014)  . History of blood transfusion 09/08/2014   "got hematoma after renal biopsy & HgB dropped"  . Hyperlipidemia Dx 2012  . Hypertension Dx 2012  . Renal disorder    "there is a spot on my kidney" (09/08/2014)    SURGICAL HISTORY: Past Surgical History:  Procedure Laterality Date  . LAPAROSCOPIC CHOLECYSTECTOMY  03/2011  . RENAL BIOPSY, PERCUTANEOUS Right 09/08/2014  . TONSILLECTOMY  ~ 1960    SOCIAL HISTORY: Social History   Social History  . Marital status: Divorced    Spouse name: N/A  . Number of children: N/A  . Years of education: N/A   Occupational History  . Not on file.   Social History Main Topics  . Smoking status: Former Smoker    Packs/day: 2.00    Years: 40.00    Types: Cigarettes    Quit date: 03/19/2011  . Smokeless tobacco: Never Used  . Alcohol use No  . Drug use: No     Comment: 09/08/2014 "I have used marijuana till the 1990's". Notes that he quit 15 yrs ago  . Sexual activity: Not Currently   Other Topics Concern  . Not on file   Social History Narrative  . 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 Lives with his daughter and her finance.  FAMILY HISTORY: Family History  Problem Relation Age of Onset  . Hypertension Mother   . Diabetes Mother   . Heart disease Mother   . Skin cancer Mother   . Hypertension Father   . Diabetes Father   .  Diabetes Sister     ALLERGIES:  has No Known Allergies.  MEDICATIONS:  Current Outpatient Prescriptions  Medication Sig Dispense Refill  . acyclovir (ZOVIRAX) 400 MG tablet TAKE 1 TABLET BY MOUTH DAILY. (DOSE ADJUSTED FOR RENAL FUNCTION) 60 tablet 3  . amLODipine (NORVASC) 10 MG tablet Take 1 tablet (10 mg total) by mouth daily. 30 tablet 5  . aspirin EC 81 MG tablet Take 1 tablet (81 mg total) by mouth daily. 30 tablet 11  . carvedilol (COREG) 25 MG tablet Take 1 tablet (25 mg total) by mouth 2 (two) times daily with a meal. (Patient taking  differently: Take 25 mg by mouth 2 (two) times daily with a meal. ) 60 tablet 5  . cetirizine (ZYRTEC) 5 MG tablet Take 1 tablet (5 mg total) by mouth daily. 30 tablet 5  . Cyanocobalamin (B-12) 1000 MCG SUBL Place 1,000 mcg under the tongue daily. 30 each 3  . dexamethasone (DECADRON) 4 MG tablet Take 5 tablets (20 mg total) by mouth once every week. 15 tablet 1  . FLONASE 50 MCG/ACT nasal spray Place 1 spray into both nostrils daily. 15.8 g 0  . furosemide (LASIX) 40 MG tablet Take 1 tablet (40 mg total) by mouth 3 (three) times daily. 90 tablet 5  . gabapentin (NEURONTIN) 250 MG/5ML solution Take 6 mLs (300 mg total) by mouth at bedtime. May increased to '400mg'$  po HS if still having neuropathic pain 240 mL 1  . hydrALAZINE (APRESOLINE) 100 MG tablet Take 1 tablet (100 mg total) by mouth 3 (three) times daily. 90 tablet 5  . ixazomib citrate (NINLARO) 3 MG capsule Take 1 capsule (3 mg total) by mouth once a week. (on D1,8 and 15 every 28 days)Take on an empty stomach 1hr before or 2hrs after food. Do not crush, chew, or open. 6 capsule 2  . LORazepam (ATIVAN) 1 MG tablet Take 1 tablet (1 mg total) by mouth every 8 (eight) hours as needed for anxiety (or nausea). 30 tablet 0  . ondansetron (ZOFRAN) 8 MG tablet Take 8 mg by mouth every 8 (eight) hours as needed for nausea or vomiting. Reported on 07/08/2015    . vitamin E 100 UNIT capsule Take 100 Units by mouth every other day. Reported on 07/08/2015    . oxyCODONE (OXY IR/ROXICODONE) 5 MG immediate release tablet Take 1 tablet (5 mg total) by mouth every 4 (four) hours as needed for severe pain. 60 tablet 0   No current facility-administered medications for this visit.     REVIEW OF SYSTEMS:   All other systems were reviewed with the patient and are negative.   PHYSICAL EXAMINATION: ECOG PERFORMANCE STATUS: 2 - Symptomatic, <50% confined to bed  Vitals:   11/22/15 0938  BP: (!) 137/56  Pulse: 80  Resp: 17  Temp: 98 F (36.7 C)   Filed  Weights   11/22/15 0938  Weight: 207 lb 3.2 oz (94 kg)    GENERAL:alert, Mild this is due to sinus congestion and sore throat and cough. SKIN: Ecchymosis on upper extremities. EYES: nl EOM, PERL OROPHARYNX: No JVD No cervical adenopathy.  NECK: no JVD LYMPH:  no palpable lymphadenopathy in the cervical, axillary or inguinal LUNGS: clear to auscultation and percussion with normal breathing effort HEART: regular rate & rhythm and 2/6 SM aortic area ABDOMEN:abdomen soft, non-tender and normal bowel sounds Musculoskeletal: B/l trace pitting pedal edema No Tenderness. No redness.  PSYCH: alert & oriented x 3 with fluent speech NEURO: no  focal motor/sensory deficits  LABORATORY DATA:   . CBC Latest Ref Rng & Units 11/22/2015  WBC 4.0 - 10.3 10e3/uL 7.2  Hemoglobin 13.0 - 17.0 g/dL 9.3(L)  Hematocrit 38.4 - 49.9 % 28.6(L)  Platelets 140 - 400 10e3/uL 123(L)   . CMP Latest Ref Rng & Units 11/22/2015 11/22/2015  Glucose 70 - 140 mg/dl 173(H) -  BUN 7.0 - 26.0 mg/dL 34.7(H) -  Creatinine 0.7 - 1.3 mg/dL 4.3(HH) -  Sodium 136 - 145 mEq/L 139 -  Potassium 3.5 - 5.1 mEq/L 3.7 -  Chloride 101 - 111 mmol/L - -  CO2 22 - 29 mEq/L 17(L) -  Calcium 8.6 - 10.2 mg/dL 8.6 -  Total Protein 6.0 - 8.5 g/dL 6.3(L) 5.6(L)  Total Bilirubin 0.20 - 1.20 mg/dL 0.41 -  Alkaline Phos 40 - 150 U/L 68 -  AST 5 - 34 U/L 14 -  ALT 0 - 55 U/L 16 -   . Lab Results  Component Value Date   IRON 102 12/02/2015   TIBC 283 12/02/2015   IRONPCTSAT 36 12/02/2015   (Iron and TIBC)  Lab Results  Component Value Date   FERRITIN 284 12/02/2015         RADIOGRAPHIC STUDIES: I have personally reviewed the radiological images as listed and agreed with the findings in the report.  .No results found.   Echocardiogram 06/10/2015:   [facility]*  *[address]  [place], [place]  [id number]  [phone]  ------------------------------------------------------------------- Transthoracic Echocardiography  Patient: Layden, Caterino MR #: [id number] Study Date: 06/10/2015 Gender: M Age: 45 Height: 172.7 cm Weight: 88 kg BSA: 2.08 m^2 Pt. Status: Room:  SONOGRAPHER [place], Outpatient ATTENDING Sullivan Lone [facility] Sullivan Lone Kishore  cc:  ------------------------------------------------------------------- LV EF: 60% - 65%  ------------------------------------------------------------------- History: PMH: Former Smoker, Renal Hematoma, Anemia. Amyloidosis. Risk factors: Hypertension.  ------------------------------------------------------------------- Study Conclusions  - Left ventricle: Global longitudinal LV strain is normal at -19.8%  The cavity size was normal. There was moderate concentric  hypertrophy. Systolic function was normal. The estimated ejection  fraction was in the range of 60% to 65%. Wall motion was normal;  there were no regional wall motion abnormalities. Features are  consistent with a pseudonormal left ventricular filling pattern,  with concomitant abnormal relaxation and increased filling  pressure (grade 2 diastolic dysfunction). Doppler parameters are  consistent with high ventricular filling pressure. - Aortic valve: Severe diffuse thickening and calcification. There  was very mild stenosis. There was mild regurgitation. Valve area  (VTI): 1.41 cm^2. Valve area (Vmax): 1.46 cm^2. Valve area  (Vmean): 1.47 cm^2.  ASSESSMENT & PLAN:    Mr Jose Garrett is very pleasant 65 yo caucasian male with   1) AL Amyloidosis noted on renal biopsy with nephrotic range proteinuria on urinalysis and some lambda free light chains on serum and urinary IFE . On diagnosis:  SPEP with small amount of M-spike 0.3  Echo  showed no systolic dysfunction but grade 2 diastolic dysfunction. [facility] cardiac staging 1 as per troponin and BNP criteria. Bone marrow with no overt evidence of multiple myeloma. PET/CT scan showed no evidence of bony lesions or lymphadenopathy . Urine showed about 15 g of protein in 24 hours   Patient is status post CyBorD x 6 cycles He was then on maintenance Velcade and had progression of his AL Amyloidosis.  He was then switched to Ixazomib and Dexamethasone and has completed 5 cycles without significant  toxicities. His UPEP shows about 15 g of proteins per 24 hours but no M spike.  SPEP from 11/22/2015 shows no M spike. Serum kappa lambda free light chains difference less than 4 Suggesting continued hematologic complete response.  Patient's creatinine upto 3.5- 4.3 range  2) Thrombocytopenia mild likely from the Ixazomib. Patient is already on renally adjusted dose of the medication.  Plan -- No overt limiting toxicity from Ixazomib  --Given persistent hematologic response we'll discontinue the patient's dexamethasone at this time. --Continue Ixazomib maintenance --continue on low-dose aspirin for VTE prophylaxis given his high risk of venous thromboembolism in the setting of nephrotic syndrome.  -continue Acyclovir for shingles prophylaxis. -Echocardiogram ordered. -awaiting results of '@4h'$  upep  2) multifactorial anemia - likely multifactorial -from AL amyloidosis, anemia from CKD. Hemoglobin is improved at 9.2 and has predominantly due to his significant chronic kidney disease  Lab Results  Component Value Date   FERRITIN 284 12/02/2015   B12 WNL -Previously deficient  Plan - No indication for PRBC transfusion at this time -Continue Procrit as per nephrology. -IV received IV feraheme for ferritin <100 since patient on EPO. Last ferritin adequate. -continue B12 replacement SL   3) CKD -related to hypertension and AL Amyloidosis. Creatinine stable  4) Feet  tingling and numbness ? Related to AL Amyloidosis vs Velcade vs B12 deficiency stable. Started taking gabapentin 12/16/2014. Much improved symptoms. No evidence of change in symptoms. Will need to monitor for neuropathy with his Ixazomib.  5)Bilateral pedal edema much improved. Ultrasound of bilateral lower extremity is negative for DVT.  Plan -lasix per PCP/nephrology. -Continue SL B12 -Continue close f/u with PCP and nephrology (Dr [name]). -Patient's CKD might progress despite hematologic response to treatment.  6) URI/Sinusitis - resovled with doxycycline -The patient has signs of worsening infection/sepsis would need to consider IVIG in the setting of hypogammaglobinemia.  Return to care with Dr. [name] in 3 weeks with cbc, cmp and Myeloma panel [facility]  All questions were answered. The patient knows to call the clinic with any problems, questions or concerns. I spent 25 minutes counseling the patient face to face. The total time spent in the appointment was 25 minutes and more than 50% was on counseling.   Sullivan Lone MD [place] Hematology/Oncology Physician [facility]  (Office):       [phone] (Work cell):  [phone] (Fax):           [phone]

## 2015-12-05 ENCOUNTER — Other Ambulatory Visit: Payer: Self-pay | Admitting: *Deleted

## 2015-12-05 ENCOUNTER — Ambulatory Visit (HOSPITAL_BASED_OUTPATIENT_CLINIC_OR_DEPARTMENT_OTHER): Payer: Self-pay

## 2015-12-05 VITALS — BP 146/57 | HR 72 | Temp 98.4°F | Resp 18

## 2015-12-05 DIAGNOSIS — D509 Iron deficiency anemia, unspecified: Secondary | ICD-10-CM

## 2015-12-05 DIAGNOSIS — D638 Anemia in other chronic diseases classified elsewhere: Secondary | ICD-10-CM

## 2015-12-05 MED ORDER — SODIUM CHLORIDE 0.9 % IV SOLN
Freq: Once | INTRAVENOUS | Status: AC
  Administered 2015-12-05: 14:00:00 via INTRAVENOUS

## 2015-12-05 MED ORDER — FERUMOXYTOL INJECTION 510 MG/17 ML
510.0000 mg | Freq: Once | INTRAVENOUS | Status: AC
  Administered 2015-12-05: 510 mg via INTRAVENOUS
  Filled 2015-12-05: qty 17

## 2015-12-05 NOTE — Patient Instructions (Signed)

## 2015-12-06 ENCOUNTER — Other Ambulatory Visit: Payer: Self-pay | Admitting: Vascular Surgery

## 2015-12-06 ENCOUNTER — Encounter: Payer: Self-pay | Admitting: Vascular Surgery

## 2015-12-06 ENCOUNTER — Ambulatory Visit (HOSPITAL_COMMUNITY)
Admission: RE | Admit: 2015-12-06 | Discharge: 2015-12-06 | Disposition: A | Discharge status: Home / Self Care | Payer: Self-pay | Source: Ambulatory Visit | Attending: Hematology | Admitting: Hematology

## 2015-12-06 ENCOUNTER — Encounter: Payer: Self-pay | Admitting: Pharmacist

## 2015-12-06 DIAGNOSIS — I350 Nonrheumatic aortic (valve) stenosis: Secondary | ICD-10-CM | POA: Insufficient documentation

## 2015-12-06 DIAGNOSIS — N183 Chronic kidney disease, stage 3 unspecified: Secondary | ICD-10-CM

## 2015-12-06 DIAGNOSIS — Z01810 Encounter for preprocedural cardiovascular examination: Secondary | ICD-10-CM

## 2015-12-06 DIAGNOSIS — E8581 Light chain (AL) amyloidosis: Secondary | ICD-10-CM

## 2015-12-06 DIAGNOSIS — Z87891 Personal history of nicotine dependence: Secondary | ICD-10-CM | POA: Insufficient documentation

## 2015-12-06 DIAGNOSIS — E785 Hyperlipidemia, unspecified: Secondary | ICD-10-CM | POA: Insufficient documentation

## 2015-12-06 DIAGNOSIS — E858 Other amyloidosis: Secondary | ICD-10-CM | POA: Insufficient documentation

## 2015-12-06 DIAGNOSIS — I119 Hypertensive heart disease without heart failure: Secondary | ICD-10-CM | POA: Insufficient documentation

## 2015-12-06 NOTE — Progress Notes (Signed)
Oral Chemotherapy Pharmacist Encounter   Received notification from [place], patient has been enrolled for Ninlaro support through 12/01/16. Case#: [id number]  Johny Drilling, PharmD, BCPS 12/06/2015  11:18 AM Oral Chemotherapy Clinic [phone]

## 2015-12-06 NOTE — Progress Notes (Signed)
*  PRELIMINARY RESULTS* Echocardiogram 2D Echocardiogram has been performed.  Leavy Cella 12/06/2015, 10:43 AM

## 2015-12-07 ENCOUNTER — Other Ambulatory Visit: Payer: Self-pay

## 2015-12-07 ENCOUNTER — Ambulatory Visit (INDEPENDENT_AMBULATORY_CARE_PROVIDER_SITE_OTHER): Payer: Self-pay | Admitting: Vascular Surgery

## 2015-12-07 ENCOUNTER — Encounter: Payer: Self-pay | Admitting: Vascular Surgery

## 2015-12-07 ENCOUNTER — Ambulatory Visit (HOSPITAL_COMMUNITY)
Admission: RE | Admit: 2015-12-07 | Discharge: 2015-12-07 | Disposition: A | Discharge status: Home / Self Care | Payer: Self-pay | Source: Ambulatory Visit | Attending: Vascular Surgery | Admitting: Vascular Surgery

## 2015-12-07 ENCOUNTER — Ambulatory Visit (INDEPENDENT_AMBULATORY_CARE_PROVIDER_SITE_OTHER)
Admission: RE | Admit: 2015-12-07 | Discharge: 2015-12-07 | Disposition: A | Discharge status: Home / Self Care | Payer: Self-pay | Source: Ambulatory Visit | Attending: Vascular Surgery | Admitting: Vascular Surgery

## 2015-12-07 VITALS — BP 152/68 | HR 73 | Temp 97.6°F | Resp 14 | Ht 69.0 in | Wt 207.0 lb

## 2015-12-07 DIAGNOSIS — N183 Chronic kidney disease, stage 3 unspecified: Secondary | ICD-10-CM

## 2015-12-07 DIAGNOSIS — Z01810 Encounter for preprocedural cardiovascular examination: Secondary | ICD-10-CM | POA: Insufficient documentation

## 2015-12-07 LAB — UPEP/UIFE/LIGHT CHAINS/TP, 24-HR UR
% BETA, Urine: 13 %
ALBUMIN, U: 67 %
ALPHA 1 URINE: 5.5 %
ALPHA-2-GLOBULIN, U: 9 %
Free Kappa Lt Chains,Ur: 150 mg/L — ABNORMAL HIGH (ref 1.35–24.19)
Free Lambda Lt Chains,Ur: 37.9 mg/L — ABNORMAL HIGH (ref 0.24–6.66)
GAMMA GLOBULIN URINE: 5.4 %
Kappa/Lambda Ratio,U: 3.96 (ref 2.04–10.37)
PDF, PE URINE: 0
PROTEIN UR: 350.8 mg/dL
Prot,24hr calculated: 7718 mg/24 hr — ABNORMAL HIGH (ref 30–150)

## 2015-12-07 NOTE — Progress Notes (Signed)
Patient name: Jose Garrett MRN: [id number] DOB: Sep 22, 1950 Sex: male  REASON FOR CONSULT: Evaluate for new hemodialysis access. Referred by Dr. [name].   HPI: Jose Garrett is a 65 y.o. male, who is not yet on dialysis who is referred for hemodialysis access. The patient has a history of amyloidosis. He also has hypertension. He has had some fatigue but denies any other uremic symptoms. Specifically he denies nausea, vomiting, anorexia, or palpitations.  He is left-handed. He did injure his right forearm and scraped the skin several weeks ago but this wound is improving.  Past Medical History:  Diagnosis Date  . Anemia Dx 2016  . Chronic diastolic CHF (congestive heart failure), NYHA class 1 ([place])    /hospital problem list 09/08/2014  . Chronic renal disease, stage III    /hospital problem list 09/08/2014  . Enlarged heart 2015  . Family history of adverse reaction to anesthesia    "my daughter can't take certain anesthesia agents" (09/08/2014)  . History of blood transfusion 09/08/2014   "got hematoma after renal biopsy & HgB dropped"  . Hyperlipidemia Dx 2012  . Hypertension Dx 2012  . Renal disorder    "there is a spot on my kidney" (09/08/2014)    Family History  Problem Relation Age of Onset  . Hypertension Mother   . Diabetes Mother   . Heart disease Mother   . Skin cancer Mother   . Hypertension Father   . Diabetes Father   . Diabetes Sister     SOCIAL HISTORY: Social History   Social History  . Marital status: Divorced    Spouse name: N/A  . Number of children: N/A  . Years of education: N/A   Occupational History  . Not on file.   Social History Main Topics  . Smoking status: Former Smoker    Packs/day: 2.00    Years: 40.00    Types: Cigarettes    Quit date: 03/19/2011  . Smokeless tobacco: Never Used  . Alcohol use No  . Drug use: No     Comment: 09/08/2014 "I have used marijuana till the 1990's". Notes that he quit 15 yrs ago  . Sexual  activity: Not Currently   Other Topics Concern  . Not on file   Social History Narrative  . No narrative on file    No Known Allergies  Current Outpatient Prescriptions  Medication Sig Dispense Refill  . amLODipine (NORVASC) 10 MG tablet Take 1 tablet (10 mg total) by mouth daily. 30 tablet 5  . furosemide (LASIX) 40 MG tablet Take 1 tablet (40 mg total) by mouth 3 (three) times daily. (Patient taking differently: Take 80 mg by mouth 3 (three) times daily. ) 90 tablet 5  . hydrALAZINE (APRESOLINE) 100 MG tablet Take 1 tablet (100 mg total) by mouth 3 (three) times daily. (Patient taking differently: Take 50 mg by mouth 3 (three) times daily. ) 90 tablet 5  . vitamin E 100 UNIT capsule Take 200 Units by mouth every other day. Reported on 07/08/2015    . acyclovir (ZOVIRAX) 400 MG tablet TAKE 1 TABLET BY MOUTH DAILY. (DOSE ADJUSTED FOR RENAL FUNCTION) 60 tablet 3  . aspirin EC 81 MG tablet Take 1 tablet (81 mg total) by mouth daily. 30 tablet 11  . carvedilol (COREG) 25 MG tablet Take 1 tablet (25 mg total) by mouth 2 (two) times daily with a meal. (Patient taking differently: Take 25 mg by mouth 2 (two) times daily with  a meal. ) 60 tablet 5  . cetirizine (ZYRTEC) 5 MG tablet Take 1 tablet (5 mg total) by mouth daily. 30 tablet 5  . Cyanocobalamin (B-12) 1000 MCG SUBL Place 1,000 mcg under the tongue daily. 30 each 3  . FLONASE 50 MCG/ACT nasal spray Place 1 spray into both nostrils daily. 15.8 g 0  . gabapentin (NEURONTIN) 250 MG/5ML solution Take 6 mLs (300 mg total) by mouth at bedtime. May increased to 400mg  po HS if still having neuropathic pain 240 mL 1  . ixazomib citrate (NINLARO) 3 MG capsule Take 1 capsule (3 mg total) by mouth once a week. (on D1,8 and 15 every 28 days)Take on an empty stomach 1hr before or 2hrs after food. Do not crush, chew, or open. 6 capsule 2  . LORazepam (ATIVAN) 1 MG tablet Take 1 mg by mouth.    . ondansetron (ZOFRAN) 8 MG tablet Take 8 mg by mouth every 8  (eight) hours as needed for nausea or vomiting. Reported on 07/08/2015    . oxyCODONE (OXY IR/ROXICODONE) 5 MG immediate release tablet Take 1 tablet (5 mg total) by mouth every 4 (four) hours as needed for severe pain. 60 tablet 0   No current facility-administered medications for this visit.     REVIEW OF SYSTEMS:  [X]  denotes positive finding, [ ]  denotes negative finding Cardiac  Comments:  Chest pain or chest pressure:    Shortness of breath upon exertion: X   Short of breath when lying flat:    Irregular heart rhythm:        Vascular    Pain in calf, thigh, or hip brought on by ambulation: X   Pain in feet at night that wakes you up from your sleep:     Blood clot in your veins:    Leg swelling:  X       Pulmonary    Oxygen at home:    Productive cough:     Wheezing:         Neurologic    Sudden weakness in arms or legs:     Sudden numbness in arms or legs:     Sudden onset of difficulty speaking or slurred speech:    Temporary loss of vision in one eye:     Problems with dizziness:         Gastrointestinal    Blood in stool:     Vomited blood:         Genitourinary    Burning when urinating:     Blood in urine:        Psychiatric    Major depression:         Hematologic    Bleeding problems:    Problems with blood clotting too easily:        Skin    Rashes or ulcers:        Constitutional    Fever or chills:      PHYSICAL EXAM: Vitals:   12/07/15 1106 12/07/15 1108  BP: (!) 150/67 (!) 152/68  Pulse: 73   Resp: 14   Temp: 97.6 F (36.4 C)   TempSrc: Oral   SpO2: 97%   Weight: 207 lb (93.9 kg)   Height: 5\' 9"  (1.753 m)     GENERAL: The patient is a well-nourished male, in no acute distress. The vital signs are documented above. CARDIAC: There is a regular rate and rhythm.  VASCULAR: I do not detect carotid bruits. He has palpable  radial pulses bilaterally. PULMONARY: There is good air exchange bilaterally without wheezing or rales. ABDOMEN:  Soft and non-tender with normal pitched bowel sounds.  MUSCULOSKELETAL: There are no major deformities or cyanosis. NEUROLOGIC: No focal weakness or paresthesias are detected. SKIN: There are no ulcers or rashes noted. PSYCHIATRIC: The patient has a normal affect.  DATA:   I reviewed his labs from 11/22/2015. GFR was 14. Creatinine was 4.3.  UPPER EXTREME VEIN MAP: I have independently interpreted the upper extreme vein map today.  On the left side, the forearm and upper arm cephalic vein looks reasonable in size. Likewise the basilic vein on the left looks reasonable in size.  On the right side, the forearm and upper arm cephalic vein looks reasonable in size as does the basilic vein.  UPPER EXTREMITY ARTERIAL DUPLEX: I have independently interpreted the upper chimney arterial duplex. There is a triphasic radial and ulnar waveform bilaterally.  MEDICAL ISSUES:  STAGE III CHRONIC KIDNEY DISEASE:  The patient appears to be a reasonable candidate for a right radiocephalic or brachiocephalic fistula. I would like to wait 2 weeks for the skin tear on his right forearm to heal. The patient is left-handed and we have selected a right arm fistula. I have explained the indications for placement of an AV fistula or AV graft. I've explained that if at all possible we will place an AV fistula.  I have reviewed the risks of placement of an AV fistula including but not limited to: failure of the fistula to mature, need for subsequent interventions, and thrombosis. In addition I have reviewed the potential complications of placement of an AV graft. These risks include, but are not limited to, graft thrombosis, graft infection, wound healing problems, bleeding, arm swelling, and steal syndrome. All the patient's questions were answered and they are agreeable to proceed with surgery.   Deitra Mayo [facility] [phone]

## 2015-12-08 ENCOUNTER — Encounter: Payer: Self-pay | Admitting: Family Medicine

## 2015-12-15 ENCOUNTER — Other Ambulatory Visit (HOSPITAL_COMMUNITY): Payer: Self-pay | Admitting: *Deleted

## 2015-12-16 ENCOUNTER — Encounter (HOSPITAL_COMMUNITY)
Admission: RE | Admit: 2015-12-16 | Discharge: 2015-12-16 | Disposition: A | Discharge status: Home / Self Care | Payer: Self-pay | Source: Ambulatory Visit | Attending: Nephrology | Admitting: Nephrology

## 2015-12-16 DIAGNOSIS — N184 Chronic kidney disease, stage 4 (severe): Secondary | ICD-10-CM | POA: Insufficient documentation

## 2015-12-16 DIAGNOSIS — D638 Anemia in other chronic diseases classified elsewhere: Secondary | ICD-10-CM | POA: Insufficient documentation

## 2015-12-16 LAB — POCT HEMOGLOBIN-HEMACUE: HEMOGLOBIN: 10 g/dL — AB (ref 13.0–17.0)

## 2015-12-16 MED ORDER — EPOETIN ALFA 20000 UNIT/ML IJ SOLN
INTRAMUSCULAR | Status: AC
  Filled 2015-12-16: qty 1

## 2015-12-16 MED ORDER — EPOETIN ALFA 20000 UNIT/ML IJ SOLN
20000.0000 [IU] | INTRAMUSCULAR | Status: DC
  Administered 2015-12-16: 20000 [IU] via SUBCUTANEOUS

## 2015-12-20 ENCOUNTER — Encounter: Payer: Self-pay | Admitting: Hematology

## 2015-12-20 ENCOUNTER — Other Ambulatory Visit: Payer: Self-pay | Admitting: *Deleted

## 2015-12-20 ENCOUNTER — Other Ambulatory Visit (HOSPITAL_BASED_OUTPATIENT_CLINIC_OR_DEPARTMENT_OTHER): Payer: Self-pay

## 2015-12-20 ENCOUNTER — Ambulatory Visit (HOSPITAL_BASED_OUTPATIENT_CLINIC_OR_DEPARTMENT_OTHER): Payer: Self-pay | Admitting: Hematology

## 2015-12-20 VITALS — BP 143/54 | HR 72 | Temp 97.9°F | Resp 19 | Wt 213.7 lb

## 2015-12-20 DIAGNOSIS — D631 Anemia in chronic kidney disease: Secondary | ICD-10-CM

## 2015-12-20 DIAGNOSIS — D508 Other iron deficiency anemias: Secondary | ICD-10-CM

## 2015-12-20 DIAGNOSIS — R609 Edema, unspecified: Secondary | ICD-10-CM

## 2015-12-20 DIAGNOSIS — C9000 Multiple myeloma not having achieved remission: Secondary | ICD-10-CM

## 2015-12-20 DIAGNOSIS — E538 Deficiency of other specified B group vitamins: Secondary | ICD-10-CM

## 2015-12-20 DIAGNOSIS — E8581 Light chain (AL) amyloidosis: Secondary | ICD-10-CM

## 2015-12-20 DIAGNOSIS — N189 Chronic kidney disease, unspecified: Secondary | ICD-10-CM

## 2015-12-20 DIAGNOSIS — D509 Iron deficiency anemia, unspecified: Secondary | ICD-10-CM

## 2015-12-20 DIAGNOSIS — D696 Thrombocytopenia, unspecified: Secondary | ICD-10-CM

## 2015-12-20 DIAGNOSIS — I129 Hypertensive chronic kidney disease with stage 1 through stage 4 chronic kidney disease, or unspecified chronic kidney disease: Secondary | ICD-10-CM

## 2015-12-20 DIAGNOSIS — D638 Anemia in other chronic diseases classified elsewhere: Secondary | ICD-10-CM

## 2015-12-20 DIAGNOSIS — R200 Anesthesia of skin: Secondary | ICD-10-CM

## 2015-12-20 LAB — CBC & DIFF AND RETIC
BASO%: 0.6 % (ref 0.0–2.0)
Basophils Absolute: 0 10*3/uL (ref 0.0–0.1)
EOS ABS: 0.3 10*3/uL (ref 0.0–0.5)
EOS%: 4.9 % (ref 0.0–7.0)
HCT: 29.4 % — ABNORMAL LOW (ref 38.4–49.9)
HEMOGLOBIN: 9.6 g/dL — AB (ref 13.0–17.1)
Immature Retic Fract: 15.8 % — ABNORMAL HIGH (ref 3.00–10.60)
LYMPH%: 13.8 % — AB (ref 14.0–49.0)
MCH: 29 pg (ref 27.2–33.4)
MCHC: 32.7 g/dL (ref 32.0–36.0)
MCV: 88.8 fL (ref 79.3–98.0)
MONO#: 0.6 10*3/uL (ref 0.1–0.9)
MONO%: 8.4 % (ref 0.0–14.0)
NEUT#: 4.9 10*3/uL (ref 1.5–6.5)
NEUT%: 72.3 % (ref 39.0–75.0)
Platelets: 110 10*3/uL — ABNORMAL LOW (ref 140–400)
RBC: 3.31 10*6/uL — AB (ref 4.20–5.82)
RDW: 17.1 % — AB (ref 11.0–14.6)
RETIC %: 2.54 % — AB (ref 0.80–1.80)
Retic Ct Abs: 84.07 10*3/uL (ref 34.80–93.90)
WBC: 6.8 10*3/uL (ref 4.0–10.3)
lymph#: 0.9 10*3/uL (ref 0.9–3.3)

## 2015-12-20 LAB — COMPREHENSIVE METABOLIC PANEL
ALK PHOS: 66 U/L (ref 40–150)
ALT: 9 U/L (ref 0–55)
AST: 12 U/L (ref 5–34)
Albumin: 3.4 g/dL — ABNORMAL LOW (ref 3.5–5.0)
Anion Gap: 13 mEq/L — ABNORMAL HIGH (ref 3–11)
BUN: 39.4 mg/dL — AB (ref 7.0–26.0)
CHLORIDE: 112 meq/L — AB (ref 98–109)
CO2: 16 meq/L — AB (ref 22–29)
Calcium: 8.9 mg/dL (ref 8.4–10.4)
Creatinine: 4.4 mg/dL (ref 0.7–1.3)
EGFR: 13 mL/min/{1.73_m2} — AB (ref >90)
GLUCOSE: 184 mg/dL — AB (ref 70–140)
POTASSIUM: 3.8 meq/L (ref 3.5–5.1)
SODIUM: 141 meq/L (ref 136–145)
Total Bilirubin: 0.4 mg/dL (ref 0.20–1.20)
Total Protein: 6.5 g/dL (ref 6.4–8.3)

## 2015-12-20 LAB — TECHNOLOGIST REVIEW

## 2015-12-20 LAB — FERRITIN: Ferritin: 335 ng/ml — ABNORMAL HIGH (ref 22–316)

## 2015-12-20 MED ORDER — GABAPENTIN 250 MG/5ML PO SOLN: 500.0000 mg | Freq: Every day | ORAL | 1 refills | Status: DC

## 2015-12-21 LAB — KAPPA/LAMBDA LIGHT CHAINS
Ig Kappa Free Light Chain: 52 mg/L — ABNORMAL HIGH (ref 3.3–19.4)
Ig Lambda Free Light Chain: 38.6 mg/L — ABNORMAL HIGH (ref 5.7–26.3)
KAPPA/LAMBDA FLC RATIO: 1.35 (ref 0.26–1.65)

## 2015-12-23 LAB — MULTIPLE MYELOMA PANEL, SERUM
ALBUMIN SERPL ELPH-MCNC: 3.4 g/dL (ref 2.9–4.4)
ALPHA 1: 0.2 g/dL (ref 0.0–0.4)
Albumin/Glob SerPl: 1.4 (ref 0.7–1.7)
Alpha2 Glob SerPl Elph-Mcnc: 1 g/dL (ref 0.4–1.0)
B-GLOBULIN SERPL ELPH-MCNC: 0.9 g/dL (ref 0.7–1.3)
GAMMA GLOB SERPL ELPH-MCNC: 0.4 g/dL (ref 0.4–1.8)
GLOBULIN, TOTAL: 2.5 g/dL (ref 2.2–3.9)
IGA/IMMUNOGLOBULIN A, SERUM: 69 mg/dL (ref 61–437)
IgG, Qn, Serum: 366 mg/dL — ABNORMAL LOW (ref 700–1600)
IgM, Qn, Serum: 116 mg/dL (ref 20–172)
Total Protein: 5.9 g/dL — ABNORMAL LOW (ref 6.0–8.5)

## 2015-12-26 ENCOUNTER — Encounter (HOSPITAL_COMMUNITY): Payer: Self-pay | Admitting: *Deleted

## 2015-12-26 NOTE — Progress Notes (Signed)
Spoke with pt for pre-op call. Pt denies cardiac history, chest pain or sob. 

## 2015-12-26 NOTE — Anesthesia Preprocedure Evaluation (Addendum)
Anesthesia Evaluation  Patient identified by MRN, date of birth, ID band Patient awake    Reviewed: Allergy & Precautions, NPO status , Patient's Chart, lab work & pertinent test results  Airway Mallampati: II  TM Distance: >3 FB Neck ROM: Full    Dental no notable dental hx.    Pulmonary former smoker,    Pulmonary exam normal        Cardiovascular hypertension, Pt. on medications + DOE  Normal cardiovascular exam  The cavity size was normal. There was severe   concentric hypertrophy. Systolic function was vigorous. The   estimated ejection fraction was in the range of 65% to 70%. Wall   motion was normal; there were no regional wall motion   abnormalities. Features are consistent with a pseudonormal left   ventricular filling pattern, with concomitant abnormal relaxation   and increased filling pressure (grade 2 diastolic dysfunction).   Doppler parameters are consistent with elevated ventricular   end-diastolic filling pressure   Neuro/Psych    GI/Hepatic GERD  Medicated,  Endo/Other    Renal/GU CRFRenal disease     Musculoskeletal   Abdominal   Peds  Hematology  (+) Blood dyscrasia, anemia ,   Anesthesia Other Findings   Reproductive/Obstetrics                            Anesthesia Physical Anesthesia Plan  ASA: III  Anesthesia Plan: MAC   Post-op Pain Management:    Induction: Intravenous  Airway Management Planned: Simple Face Mask  Additional Equipment:   Intra-op Plan:   Post-operative Plan: Extubation in OR  Informed Consent: I have reviewed the patients History and Physical, chart, labs and discussed the procedure including the risks, benefits and alternatives for the proposed anesthesia with the patient or authorized representative who has indicated his/her understanding and acceptance.   Dental advisory given  Plan Discussed with: CRNA  Anesthesia Plan  Comments:         Anesthesia Quick Evaluation

## 2015-12-27 ENCOUNTER — Encounter (HOSPITAL_COMMUNITY): Payer: Self-pay | Admitting: Anesthesiology

## 2015-12-27 ENCOUNTER — Ambulatory Visit (HOSPITAL_COMMUNITY)
Admission: RE | Admit: 2015-12-27 | Discharge: 2015-12-27 | Disposition: A | Discharge status: Home / Self Care | Payer: Self-pay | Source: Ambulatory Visit | Attending: Vascular Surgery | Admitting: Vascular Surgery

## 2015-12-27 ENCOUNTER — Encounter (HOSPITAL_COMMUNITY)
Admission: RE | Disposition: A | Discharge status: Home / Self Care | Payer: Self-pay | Source: Ambulatory Visit | Attending: Vascular Surgery

## 2015-12-27 ENCOUNTER — Other Ambulatory Visit: Payer: Self-pay | Admitting: *Deleted

## 2015-12-27 ENCOUNTER — Ambulatory Visit (HOSPITAL_COMMUNITY): Payer: Self-pay | Admitting: Anesthesiology

## 2015-12-27 ENCOUNTER — Other Ambulatory Visit: Payer: Self-pay

## 2015-12-27 DIAGNOSIS — E785 Hyperlipidemia, unspecified: Secondary | ICD-10-CM | POA: Insufficient documentation

## 2015-12-27 DIAGNOSIS — I5032 Chronic diastolic (congestive) heart failure: Secondary | ICD-10-CM | POA: Insufficient documentation

## 2015-12-27 DIAGNOSIS — Z992 Dependence on renal dialysis: Secondary | ICD-10-CM | POA: Insufficient documentation

## 2015-12-27 DIAGNOSIS — N184 Chronic kidney disease, stage 4 (severe): Secondary | ICD-10-CM | POA: Insufficient documentation

## 2015-12-27 DIAGNOSIS — Z7982 Long term (current) use of aspirin: Secondary | ICD-10-CM | POA: Insufficient documentation

## 2015-12-27 DIAGNOSIS — K219 Gastro-esophageal reflux disease without esophagitis: Secondary | ICD-10-CM | POA: Insufficient documentation

## 2015-12-27 DIAGNOSIS — Z87891 Personal history of nicotine dependence: Secondary | ICD-10-CM | POA: Insufficient documentation

## 2015-12-27 DIAGNOSIS — I130 Hypertensive heart and chronic kidney disease with heart failure and stage 1 through stage 4 chronic kidney disease, or unspecified chronic kidney disease: Secondary | ICD-10-CM | POA: Insufficient documentation

## 2015-12-27 DIAGNOSIS — Z4931 Encounter for adequacy testing for hemodialysis: Secondary | ICD-10-CM

## 2015-12-27 DIAGNOSIS — N186 End stage renal disease: Secondary | ICD-10-CM

## 2015-12-27 DIAGNOSIS — E8581 Light chain (AL) amyloidosis: Secondary | ICD-10-CM

## 2015-12-27 HISTORY — DX: Restless legs syndrome: G25.81

## 2015-12-27 HISTORY — PX: AV FISTULA PLACEMENT: SHX1204

## 2015-12-27 HISTORY — DX: Diarrhea, unspecified: R19.7

## 2015-12-27 HISTORY — DX: Gastro-esophageal reflux disease without esophagitis: K21.9

## 2015-12-27 LAB — POCT I-STAT 4, (NA,K, GLUC, HGB,HCT)
Glucose, Bld: 119 mg/dL — ABNORMAL HIGH (ref 65–99)
HEMATOCRIT: 27 % — AB (ref 39.0–52.0)
Hemoglobin: 9.2 g/dL — ABNORMAL LOW (ref 13.0–17.0)
Potassium: 4.1 mmol/L (ref 3.5–5.1)
Sodium: 143 mmol/L (ref 135–145)

## 2015-12-27 SURGERY — ARTERIOVENOUS (AV) FISTULA CREATION
Anesthesia: Monitor Anesthesia Care | Site: Arm Lower | Laterality: Right

## 2015-12-27 MED ORDER — PROTAMINE SULFATE 10 MG/ML IV SOLN
INTRAVENOUS | Status: DC | PRN
  Administered 2015-12-27: 40 mg via INTRAVENOUS

## 2015-12-27 MED ORDER — CHLORHEXIDINE GLUCONATE CLOTH 2 % EX PADS: 6.0000 | MEDICATED_PAD | Freq: Once | CUTANEOUS | Status: DC

## 2015-12-27 MED ORDER — LIDOCAINE HCL (CARDIAC) 20 MG/ML IV SOLN
INTRAVENOUS | Status: DC | PRN
  Administered 2015-12-27: 60 mg via INTRATRACHEAL

## 2015-12-27 MED ORDER — FENTANYL CITRATE (PF) 100 MCG/2ML IJ SOLN
INTRAMUSCULAR | Status: DC | PRN
  Administered 2015-12-27: 50 ug via INTRAVENOUS

## 2015-12-27 MED ORDER — OXYCODONE HCL 5 MG PO TABS: 5.0000 mg | ORAL_TABLET | Freq: Once | ORAL | Status: DC | PRN

## 2015-12-27 MED ORDER — DEXTROSE 5 % IV SOLN
INTRAVENOUS | Status: AC
  Filled 2015-12-27: qty 1.5

## 2015-12-27 MED ORDER — SODIUM CHLORIDE 0.9 % IV SOLN
INTRAVENOUS | Status: DC | PRN
  Administered 2015-12-27: 08:00:00

## 2015-12-27 MED ORDER — MIDAZOLAM HCL 5 MG/5ML IJ SOLN
INTRAMUSCULAR | Status: DC | PRN
  Administered 2015-12-27: 2 mg via INTRAVENOUS

## 2015-12-27 MED ORDER — 0.9 % SODIUM CHLORIDE (POUR BTL) OPTIME
TOPICAL | Status: DC | PRN
  Administered 2015-12-27: 1000 mL

## 2015-12-27 MED ORDER — PROPOFOL 500 MG/50ML IV EMUL
INTRAVENOUS | Status: DC | PRN
  Administered 2015-12-27: 100 ug/kg/min via INTRAVENOUS

## 2015-12-27 MED ORDER — LIDOCAINE HCL (PF) 1 % IJ SOLN
INTRAMUSCULAR | Status: AC
  Filled 2015-12-27: qty 30

## 2015-12-27 MED ORDER — DEXTROSE 5 % IV SOLN
1.5000 g | INTRAVENOUS | Status: AC
  Administered 2015-12-27: 1.5 g via INTRAVENOUS

## 2015-12-27 MED ORDER — SODIUM CHLORIDE 0.9 % IV SOLN
INTRAVENOUS | Status: DC
  Administered 2015-12-27: 07:00:00 via INTRAVENOUS

## 2015-12-27 MED ORDER — OXYCODONE HCL 5 MG/5ML PO SOLN: 5.0000 mg | Freq: Once | ORAL | Status: DC | PRN

## 2015-12-27 MED ORDER — PROPOFOL 10 MG/ML IV BOLUS
INTRAVENOUS | Status: AC
  Filled 2015-12-27: qty 20

## 2015-12-27 MED ORDER — HEPARIN SODIUM (PORCINE) 1000 UNIT/ML IJ SOLN
INTRAMUSCULAR | Status: AC
  Filled 2015-12-27: qty 1

## 2015-12-27 MED ORDER — ONDANSETRON HCL 4 MG/2ML IJ SOLN
INTRAMUSCULAR | Status: AC
  Filled 2015-12-27: qty 2

## 2015-12-27 MED ORDER — FENTANYL CITRATE (PF) 100 MCG/2ML IJ SOLN
INTRAMUSCULAR | Status: AC
  Filled 2015-12-27: qty 2

## 2015-12-27 MED ORDER — MIDAZOLAM HCL 2 MG/2ML IJ SOLN
INTRAMUSCULAR | Status: AC
  Filled 2015-12-27: qty 2

## 2015-12-27 MED ORDER — PROMETHAZINE HCL 25 MG/ML IJ SOLN: 6.2500 mg | INTRAMUSCULAR | Status: DC | PRN

## 2015-12-27 MED ORDER — HEPARIN SODIUM (PORCINE) 1000 UNIT/ML IJ SOLN
INTRAMUSCULAR | Status: DC | PRN
  Administered 2015-12-27: 8000 [IU] via INTRAVENOUS

## 2015-12-27 MED ORDER — FLONASE 50 MCG/ACT NA SUSP: 1.0000 | Freq: Every day | NASAL | Status: DC | PRN

## 2015-12-27 MED ORDER — PROTAMINE SULFATE 10 MG/ML IV SOLN
INTRAVENOUS | Status: AC
  Filled 2015-12-27: qty 5

## 2015-12-27 MED ORDER — OXYCODONE HCL 5 MG PO TABS: 5.0000 mg | ORAL_TABLET | Freq: Four times a day (QID) | ORAL | 0 refills | Status: DC | PRN

## 2015-12-27 MED ORDER — LIDOCAINE HCL (PF) 1 % IJ SOLN
INTRAMUSCULAR | Status: DC | PRN
  Administered 2015-12-27: 6 mL

## 2015-12-27 MED ORDER — ONDANSETRON HCL 4 MG/2ML IJ SOLN
INTRAMUSCULAR | Status: DC | PRN
  Administered 2015-12-27: 4 mg via INTRAVENOUS

## 2015-12-27 SURGICAL SUPPLY — 29 items
ADH SKN CLS APL DERMABOND .7 (GAUZE/BANDAGES/DRESSINGS) ×1
ARMBAND PINK RESTRICT EXTREMIT (MISCELLANEOUS) ×6 IMPLANT
CANISTER SUCTION 2500CC (MISCELLANEOUS) ×3 IMPLANT
CANNULA VESSEL 3MM 2 BLNT TIP (CANNULA) ×3 IMPLANT
CLIP TI MEDIUM 6 (CLIP) ×3 IMPLANT
CLIP TI WIDE RED SMALL 6 (CLIP) ×5 IMPLANT
COVER PROBE W GEL 5X96 (DRAPES) IMPLANT
DECANTER SPIKE VIAL GLASS SM (MISCELLANEOUS) ×3 IMPLANT
DERMABOND ADVANCED (GAUZE/BANDAGES/DRESSINGS) ×2
DERMABOND ADVANCED .7 DNX12 (GAUZE/BANDAGES/DRESSINGS) ×1 IMPLANT
ELECT REM PT RETURN 9FT ADLT (ELECTROSURGICAL) ×3
ELECTRODE REM PT RTRN 9FT ADLT (ELECTROSURGICAL) ×1 IMPLANT
GLOVE BIO SURGEON STRL SZ7.5 (GLOVE) ×3 IMPLANT
GLOVE BIOGEL PI IND STRL 8 (GLOVE) ×1 IMPLANT
GLOVE BIOGEL PI INDICATOR 8 (GLOVE) ×2
GOWN STRL REUS W/ TWL LRG LVL3 (GOWN DISPOSABLE) ×3 IMPLANT
GOWN STRL REUS W/TWL LRG LVL3 (GOWN DISPOSABLE) ×9
KIT BASIN OR (CUSTOM PROCEDURE TRAY) ×3 IMPLANT
KIT ROOM TURNOVER OR (KITS) ×3 IMPLANT
NS IRRIG 1000ML POUR BTL (IV SOLUTION) ×3 IMPLANT
PACK CV ACCESS (CUSTOM PROCEDURE TRAY) ×3 IMPLANT
PAD ARMBOARD 7.5X6 YLW CONV (MISCELLANEOUS) ×6 IMPLANT
SPONGE SURGIFOAM ABS GEL 100 (HEMOSTASIS) IMPLANT
SUT PROLENE 6 0 BV (SUTURE) ×5 IMPLANT
SUT VIC AB 3-0 SH 27 (SUTURE) ×3
SUT VIC AB 3-0 SH 27X BRD (SUTURE) ×1 IMPLANT
SUT VICRYL 4-0 PS2 18IN ABS (SUTURE) ×3 IMPLANT
UNDERPAD 30X30 (UNDERPADS AND DIAPERS) ×3 IMPLANT
WATER STERILE IRR 1000ML POUR (IV SOLUTION) ×3 IMPLANT

## 2015-12-27 NOTE — H&P (View-Only) (Signed)
Patient name: Jose Garrett MRN: [id number] DOB: 01/29/1951 Sex: male  REASON FOR CONSULT: Evaluate for new hemodialysis access. Referred by Dr. [name].   HPI: Jose Garrett is a 65 y.o. male, who is not yet on dialysis who is referred for hemodialysis access. The patient has a history of amyloidosis. He also has hypertension. He has had some fatigue but denies any other uremic symptoms. Specifically he denies nausea, vomiting, anorexia, or palpitations.  He is left-handed. He did injure his right forearm and scraped the skin several weeks ago but this wound is improving.  Past Medical History:  Diagnosis Date  . Anemia Dx 2016  . Chronic diastolic CHF (congestive heart failure), NYHA class 1 ([place])    /hospital problem list 09/08/2014  . Chronic renal disease, stage III    /hospital problem list 09/08/2014  . Enlarged heart 2015  . Family history of adverse reaction to anesthesia    "my daughter can't take certain anesthesia agents" (09/08/2014)  . History of blood transfusion 09/08/2014   "got hematoma after renal biopsy & HgB dropped"  . Hyperlipidemia Dx 2012  . Hypertension Dx 2012  . Renal disorder    "there is a spot on my kidney" (09/08/2014)    Family History  Problem Relation Age of Onset  . Hypertension Mother   . Diabetes Mother   . Heart disease Mother   . Skin cancer Mother   . Hypertension Father   . Diabetes Father   . Diabetes Sister     SOCIAL HISTORY: Social History   Social History  . Marital status: Divorced    Spouse name: N/A  . Number of children: N/A  . Years of education: N/A   Occupational History  . Not on file.   Social History Main Topics  . Smoking status: Former Smoker    Packs/day: 2.00    Years: 40.00    Types: Cigarettes    Quit date: 03/19/2011  . Smokeless tobacco: Never Used  . Alcohol use No  . Drug use: No     Comment: 09/08/2014 "I have used marijuana till the 1990's". Notes that he quit 15 yrs ago  . Sexual  activity: Not Currently   Other Topics Concern  . Not on file   Social History Narrative  . No narrative on file    No Known Allergies  Current Outpatient Prescriptions  Medication Sig Dispense Refill  . amLODipine (NORVASC) 10 MG tablet Take 1 tablet (10 mg total) by mouth daily. 30 tablet 5  . furosemide (LASIX) 40 MG tablet Take 1 tablet (40 mg total) by mouth 3 (three) times daily. (Patient taking differently: Take 80 mg by mouth 3 (three) times daily. ) 90 tablet 5  . hydrALAZINE (APRESOLINE) 100 MG tablet Take 1 tablet (100 mg total) by mouth 3 (three) times daily. (Patient taking differently: Take 50 mg by mouth 3 (three) times daily. ) 90 tablet 5  . vitamin E 100 UNIT capsule Take 200 Units by mouth every other day. Reported on 07/08/2015    . acyclovir (ZOVIRAX) 400 MG tablet TAKE 1 TABLET BY MOUTH DAILY. (DOSE ADJUSTED FOR RENAL FUNCTION) 60 tablet 3  . aspirin EC 81 MG tablet Take 1 tablet (81 mg total) by mouth daily. 30 tablet 11  . carvedilol (COREG) 25 MG tablet Take 1 tablet (25 mg total) by mouth 2 (two) times daily with a meal. (Patient taking differently: Take 25 mg by mouth 2 (two) times daily with  a meal. ) 60 tablet 5  . cetirizine (ZYRTEC) 5 MG tablet Take 1 tablet (5 mg total) by mouth daily. 30 tablet 5  . Cyanocobalamin (B-12) 1000 MCG SUBL Place 1,000 mcg under the tongue daily. 30 each 3  . FLONASE 50 MCG/ACT nasal spray Place 1 spray into both nostrils daily. 15.8 g 0  . gabapentin (NEURONTIN) 250 MG/5ML solution Take 6 mLs (300 mg total) by mouth at bedtime. May increased to 400mg  po HS if still having neuropathic pain 240 mL 1  . ixazomib citrate (NINLARO) 3 MG capsule Take 1 capsule (3 mg total) by mouth once a week. (on D1,8 and 15 every 28 days)Take on an empty stomach 1hr before or 2hrs after food. Do not crush, chew, or open. 6 capsule 2  . LORazepam (ATIVAN) 1 MG tablet Take 1 mg by mouth.    . ondansetron (ZOFRAN) 8 MG tablet Take 8 mg by mouth every 8  (eight) hours as needed for nausea or vomiting. Reported on 07/08/2015    . oxyCODONE (OXY IR/ROXICODONE) 5 MG immediate release tablet Take 1 tablet (5 mg total) by mouth every 4 (four) hours as needed for severe pain. 60 tablet 0   No current facility-administered medications for this visit.     REVIEW OF SYSTEMS:  [X]  denotes positive finding, [ ]  denotes negative finding Cardiac  Comments:  Chest pain or chest pressure:    Shortness of breath upon exertion: X   Short of breath when lying flat:    Irregular heart rhythm:        Vascular    Pain in calf, thigh, or hip brought on by ambulation: X   Pain in feet at night that wakes you up from your sleep:     Blood clot in your veins:    Leg swelling:  X       Pulmonary    Oxygen at home:    Productive cough:     Wheezing:         Neurologic    Sudden weakness in arms or legs:     Sudden numbness in arms or legs:     Sudden onset of difficulty speaking or slurred speech:    Temporary loss of vision in one eye:     Problems with dizziness:         Gastrointestinal    Blood in stool:     Vomited blood:         Genitourinary    Burning when urinating:     Blood in urine:        Psychiatric    Major depression:         Hematologic    Bleeding problems:    Problems with blood clotting too easily:        Skin    Rashes or ulcers:        Constitutional    Fever or chills:      PHYSICAL EXAM: Vitals:   12/07/15 1106 12/07/15 1108  BP: (!) 150/67 (!) 152/68  Pulse: 73   Resp: 14   Temp: 97.6 F (36.4 C)   TempSrc: Oral   SpO2: 97%   Weight: 207 lb (93.9 kg)   Height: 5\' 9"  (1.753 m)     GENERAL: The patient is a well-nourished male, in no acute distress. The vital signs are documented above. CARDIAC: There is a regular rate and rhythm.  VASCULAR: I do not detect carotid bruits. He has palpable  radial pulses bilaterally. PULMONARY: There is good air exchange bilaterally without wheezing or rales. ABDOMEN:  Soft and non-tender with normal pitched bowel sounds.  MUSCULOSKELETAL: There are no major deformities or cyanosis. NEUROLOGIC: No focal weakness or paresthesias are detected. SKIN: There are no ulcers or rashes noted. PSYCHIATRIC: The patient has a normal affect.  DATA:   I reviewed his labs from 11/22/2015. GFR was 14. Creatinine was 4.3.  UPPER EXTREME VEIN MAP: I have independently interpreted the upper extreme vein map today.  On the left side, the forearm and upper arm cephalic vein looks reasonable in size. Likewise the basilic vein on the left looks reasonable in size.  On the right side, the forearm and upper arm cephalic vein looks reasonable in size as does the basilic vein.  UPPER EXTREMITY ARTERIAL DUPLEX: I have independently interpreted the upper chimney arterial duplex. There is a triphasic radial and ulnar waveform bilaterally.  MEDICAL ISSUES:  STAGE III CHRONIC KIDNEY DISEASE:  The patient appears to be a reasonable candidate for a right radiocephalic or brachiocephalic fistula. I would like to wait 2 weeks for the skin tear on his right forearm to heal. The patient is left-handed and we have selected a right arm fistula. I have explained the indications for placement of an AV fistula or AV graft. I've explained that if at all possible we will place an AV fistula.  I have reviewed the risks of placement of an AV fistula including but not limited to: failure of the fistula to mature, need for subsequent interventions, and thrombosis. In addition I have reviewed the potential complications of placement of an AV graft. These risks include, but are not limited to, graft thrombosis, graft infection, wound healing problems, bleeding, arm swelling, and steal syndrome. All the patient's questions were answered and they are agreeable to proceed with surgery.   Deitra Mayo [facility] [employer] [facility] [phone]

## 2015-12-27 NOTE — Op Note (Signed)
    NAME: Jose Garrett   MRN: [id number] DOB: June 14, 1950    DATE OF OPERATION: 12/27/2015  PREOP DIAGNOSIS: Stage IV chronic kidney disease  POSTOP DIAGNOSIS: Same  PROCEDURE: Right radial cephalic AV fistula  SURGEON: Judeth Cornfield. Scot Dock, MD, FACS  ASSIST: Silva Bandy, [facility]  ANESTHESIA: Local with sedation   EBL: Minimal  INDICATIONS: Jose Garrett is a 65 y.o. male who presents for new access. He is not yet on dialysis.  FINDINGS: 4 mm cephalic vein  TECHNIQUE: The patient was taken to the operating room and sedated by anesthesia. The right upper extremity was prepped and draped in usual sterile fashion. The vein was somewhat lateral and therefore elected to make a separate incision of the vein. After the skin was anesthetized. A longitudinal incision was made over the cephalic vein. This was dissected free and ligated distally. It irrigated up nicely with heparinized saline.  After the skin was anesthetized, a separate small longitudinal incision was made over the radial artery. This was dissected free beneath the fascia. The patient was heparinized. A tunnel was created between the 2 incisions. The vein was brought to the tunnel.  The radial artery was clamped proximally and distally and longitudinal arteriotomy was made. The vein was spatulated and sewn end-to-side to the artery using continuous 6-0 Prolene suture. At the completion was a good thrill in the fistula. The heparin was partially reversed with protamine. The wounds were closed with a deep buried 3-0 Vicryl skin closed with 4-0 Vicryl. Dermabond was applied. The patient tolerated the procedure well and was transferred to the recovery room in stable condition. All needle and sponge counts were correct.  Deitra Mayo, MD, FACS [facility]  DATE OF DICTATION:   12/27/2015

## 2015-12-27 NOTE — Anesthesia Postprocedure Evaluation (Signed)
Anesthesia Post Note  Patient: Jose Garrett  Procedure(s) Performed: Procedure(s) (LRB): Right Arm ARTERIOVENOUS (AV) FISTULA CREATION (Right)  Patient location during evaluation: PACU Anesthesia Type: MAC Level of consciousness: awake and alert Pain management: pain level controlled Vital Signs Assessment: post-procedure vital signs reviewed and stable Respiratory status: spontaneous breathing, nonlabored ventilation, respiratory function stable and patient connected to nasal cannula oxygen Cardiovascular status: stable and blood pressure returned to baseline Anesthetic complications: no    Last Vitals:  Vitals:   12/27/15 0942 12/27/15 0947  BP: 131/68 (!) 125/59  Pulse: 66 70  Resp: 20 16  Temp: 36.6 C     Last Pain:  Vitals:   12/27/15 0947  TempSrc:   PainSc: 0-No pain                 Reginal Lutes

## 2015-12-27 NOTE — Transfer of Care (Signed)
Immediate Anesthesia Transfer of Care Note  Patient: Jose Garrett  Procedure(s) Performed: Procedure(s): Right Arm ARTERIOVENOUS (AV) FISTULA CREATION (Right)  Patient Location: PACU  Anesthesia Type:MAC  Level of Consciousness: awake, alert , oriented and patient cooperative  Airway & Oxygen Therapy: Patient Spontanous Breathing and Patient connected to nasal cannula oxygen  Post-op Assessment: Report given to RN and Post -op Vital signs reviewed and stable  Post vital signs: Reviewed and stable  Last Vitals:  Vitals:   12/27/15 0624 12/27/15 0859  BP: (!) 129/50 (!) (P) 121/59  Pulse: 71 (P) 69  Resp: 20 (!) (P) 21  Temp: 36.7 C 36.6 C    Last Pain:  Vitals:   12/27/15 0859  TempSrc:   PainSc: (P) 0-No pain         Complications: No apparent anesthesia complications

## 2015-12-27 NOTE — Interval H&P Note (Signed)
History and Physical Interval Note:  12/27/2015 6:54 AM  Jose Garrett  has presented today for surgery, with the diagnosis of Stage IV Chronic Kidney Disease N18.4  The various methods of treatment have been discussed with the patient and family. After consideration of risks, benefits and other options for treatment, the patient has consented to  Procedure(s): ARTERIOVENOUS (AV) FISTULA CREATION (Right) as a surgical intervention .  The patient's history has been reviewed, patient examined, no change in status, stable for surgery.  I have reviewed the patient's chart and labs.  Questions were answered to the patient's satisfaction.     Deitra Mayo

## 2015-12-27 NOTE — Addendum Note (Signed)
Addendum  created 12/27/15 1712 by Laretta Alstrom, CRNA   Anesthesia Event edited

## 2015-12-28 ENCOUNTER — Telehealth: Payer: Self-pay | Admitting: Vascular Surgery

## 2015-12-28 ENCOUNTER — Encounter (HOSPITAL_COMMUNITY): Payer: Self-pay | Admitting: Vascular Surgery

## 2015-12-28 NOTE — Telephone Encounter (Signed)
Spoke to pt on home # for appt on 11/29

## 2015-12-28 NOTE — Telephone Encounter (Signed)
-----   Message from Mena Goes, RN sent at 12/27/2015  3:40 PM EDT ----- Regarding: schedule   ----- Message ----- From: Angelia Mould, MD Sent: 12/27/2015   8:59 AM To: [facility] Charge Pool Subject: charge                                         This patient had a right radiocephalic AV fistula. Assistant was Silva Bandy, [facility] He will need a follow up visit in 6 weeks with a duplex at that time to check on the maturation of his fistula. Thank you. CD

## 2015-12-30 ENCOUNTER — Telehealth: Payer: Self-pay

## 2015-12-30 ENCOUNTER — Encounter (HOSPITAL_COMMUNITY)
Admission: RE | Admit: 2015-12-30 | Discharge: 2015-12-30 | Disposition: A | Discharge status: Home / Self Care | Payer: Self-pay | Source: Ambulatory Visit | Attending: Nephrology | Admitting: Nephrology

## 2015-12-30 DIAGNOSIS — D638 Anemia in other chronic diseases classified elsewhere: Secondary | ICD-10-CM

## 2015-12-30 LAB — IRON AND TIBC
IRON: 65 ug/dL (ref 45–182)
Saturation Ratios: 24 % (ref 17.9–39.5)
TIBC: 276 ug/dL (ref 250–450)
UIBC: 211 ug/dL

## 2015-12-30 LAB — FERRITIN: FERRITIN: 290 ng/mL (ref 24–336)

## 2015-12-30 LAB — POCT HEMOGLOBIN-HEMACUE: Hemoglobin: 9.2 g/dL — ABNORMAL LOW (ref 13.0–17.0)

## 2015-12-30 MED ORDER — EPOETIN ALFA 20000 UNIT/ML IJ SOLN
INTRAMUSCULAR | Status: AC
  Filled 2015-12-30: qty 1

## 2015-12-30 MED ORDER — EPOETIN ALFA 20000 UNIT/ML IJ SOLN
20000.0000 [IU] | INTRAMUSCULAR | Status: DC
  Administered 2015-12-30: 20000 [IU] via SUBCUTANEOUS

## 2015-12-30 NOTE — Telephone Encounter (Signed)
Pt. called to report swelling in the right hand; stated it is mostly on top of the hand.  Denied numbness / tingling of the fingers.  Denied swelling of the forearm.  Denied fever/ chills.  Denied redness/ inflammation.  Encouraged to elevate the right arm above level of heart, and do gentle AROM several times/ day.  Pt. Verb. Understanding.  Encouraged to call office if any other concerns.  Agreed.

## 2015-12-31 LAB — PTH, INTACT AND CALCIUM
Calcium, Total (PTH): 8.1 mg/dL — ABNORMAL LOW (ref 8.6–10.2)
PTH: 213 pg/mL — AB (ref 15–65)

## 2016-01-05 ENCOUNTER — Encounter: Payer: Self-pay | Admitting: Hematology

## 2016-01-10 ENCOUNTER — Encounter: Payer: Self-pay | Admitting: Hematology

## 2016-01-10 NOTE — Progress Notes (Signed)
Pt has been approved w/ [employer] for the [facility] Patient Assistance Program effective 12/13/15 - 12/11/16.  This program does not retro.

## 2016-01-12 ENCOUNTER — Other Ambulatory Visit (HOSPITAL_COMMUNITY): Payer: Self-pay | Admitting: Vascular Surgery

## 2016-01-13 ENCOUNTER — Encounter (HOSPITAL_COMMUNITY): Payer: Self-pay

## 2016-01-16 NOTE — Telephone Encounter (Signed)
9:34 am: Patient call asking "when is next F/u scheduled.  I was seen October 10 th.  Have called leaving messages and sent a patient portal message about issues I have shortness of breath swelling.  I am available to come in at any time, just call me at [phone]."

## 2016-01-19 ENCOUNTER — Encounter (HOSPITAL_COMMUNITY)
Admission: RE | Admit: 2016-01-19 | Discharge: 2016-01-19 | Disposition: A | Discharge status: Home / Self Care | Payer: Self-pay | Source: Ambulatory Visit | Attending: Nephrology | Admitting: Nephrology

## 2016-01-19 DIAGNOSIS — N184 Chronic kidney disease, stage 4 (severe): Secondary | ICD-10-CM | POA: Insufficient documentation

## 2016-01-19 DIAGNOSIS — D638 Anemia in other chronic diseases classified elsewhere: Secondary | ICD-10-CM | POA: Insufficient documentation

## 2016-01-19 LAB — POCT HEMOGLOBIN-HEMACUE: HEMOGLOBIN: 8.6 g/dL — AB (ref 13.0–17.0)

## 2016-01-19 MED ORDER — EPOETIN ALFA 20000 UNIT/ML IJ SOLN
20000.0000 [IU] | INTRAMUSCULAR | Status: DC
  Administered 2016-01-19: 20000 [IU] via SUBCUTANEOUS

## 2016-01-19 MED ORDER — EPOETIN ALFA 20000 UNIT/ML IJ SOLN
INTRAMUSCULAR | Status: AC
  Filled 2016-01-19: qty 1

## 2016-01-23 ENCOUNTER — Emergency Department (HOSPITAL_COMMUNITY): Payer: Self-pay

## 2016-01-23 ENCOUNTER — Inpatient Hospital Stay (HOSPITAL_COMMUNITY)
Admission: EM | Admit: 2016-01-23 | Discharge: 2016-02-04 | DRG: 286 | Disposition: A | Discharge status: Home / Self Care | Payer: Self-pay | Attending: Internal Medicine | Admitting: Internal Medicine

## 2016-01-23 ENCOUNTER — Encounter (HOSPITAL_COMMUNITY): Payer: Self-pay

## 2016-01-23 DIAGNOSIS — N185 Chronic kidney disease, stage 5: Secondary | ICD-10-CM | POA: Diagnosis present

## 2016-01-23 DIAGNOSIS — J810 Acute pulmonary edema: Secondary | ICD-10-CM | POA: Diagnosis present

## 2016-01-23 DIAGNOSIS — I509 Heart failure, unspecified: Secondary | ICD-10-CM

## 2016-01-23 DIAGNOSIS — J9601 Acute respiratory failure with hypoxia: Secondary | ICD-10-CM | POA: Diagnosis present

## 2016-01-23 DIAGNOSIS — E8581 Light chain (AL) amyloidosis: Secondary | ICD-10-CM | POA: Diagnosis present

## 2016-01-23 DIAGNOSIS — D696 Thrombocytopenia, unspecified: Secondary | ICD-10-CM | POA: Diagnosis present

## 2016-01-23 DIAGNOSIS — K219 Gastro-esophageal reflux disease without esophagitis: Secondary | ICD-10-CM | POA: Diagnosis present

## 2016-01-23 DIAGNOSIS — Z87891 Personal history of nicotine dependence: Secondary | ICD-10-CM

## 2016-01-23 DIAGNOSIS — I132 Hypertensive heart and chronic kidney disease with heart failure and with stage 5 chronic kidney disease, or end stage renal disease: Principal | ICD-10-CM | POA: Diagnosis present

## 2016-01-23 DIAGNOSIS — E859 Amyloidosis, unspecified: Secondary | ICD-10-CM | POA: Diagnosis present

## 2016-01-23 DIAGNOSIS — Z7982 Long term (current) use of aspirin: Secondary | ICD-10-CM

## 2016-01-23 DIAGNOSIS — I5033 Acute on chronic diastolic (congestive) heart failure: Secondary | ICD-10-CM | POA: Diagnosis present

## 2016-01-23 DIAGNOSIS — N186 End stage renal disease: Secondary | ICD-10-CM

## 2016-01-23 DIAGNOSIS — E8889 Other specified metabolic disorders: Secondary | ICD-10-CM | POA: Diagnosis present

## 2016-01-23 DIAGNOSIS — R0609 Other forms of dyspnea: Secondary | ICD-10-CM | POA: Diagnosis present

## 2016-01-23 DIAGNOSIS — I5031 Acute diastolic (congestive) heart failure: Secondary | ICD-10-CM | POA: Diagnosis present

## 2016-01-23 DIAGNOSIS — R509 Fever, unspecified: Secondary | ICD-10-CM

## 2016-01-23 DIAGNOSIS — N184 Chronic kidney disease, stage 4 (severe): Secondary | ICD-10-CM

## 2016-01-23 DIAGNOSIS — R8271 Bacteriuria: Secondary | ICD-10-CM | POA: Diagnosis not present

## 2016-01-23 DIAGNOSIS — N179 Acute kidney failure, unspecified: Secondary | ICD-10-CM

## 2016-01-23 DIAGNOSIS — Z79899 Other long term (current) drug therapy: Secondary | ICD-10-CM

## 2016-01-23 DIAGNOSIS — Z8249 Family history of ischemic heart disease and other diseases of the circulatory system: Secondary | ICD-10-CM

## 2016-01-23 DIAGNOSIS — N189 Chronic kidney disease, unspecified: Secondary | ICD-10-CM

## 2016-01-23 DIAGNOSIS — E8770 Fluid overload, unspecified: Secondary | ICD-10-CM | POA: Diagnosis present

## 2016-01-23 DIAGNOSIS — E876 Hypokalemia: Secondary | ICD-10-CM | POA: Diagnosis present

## 2016-01-23 DIAGNOSIS — E8779 Other fluid overload: Secondary | ICD-10-CM

## 2016-01-23 DIAGNOSIS — E559 Vitamin D deficiency, unspecified: Secondary | ICD-10-CM | POA: Diagnosis present

## 2016-01-23 DIAGNOSIS — Z79891 Long term (current) use of opiate analgesic: Secondary | ICD-10-CM

## 2016-01-23 DIAGNOSIS — Z992 Dependence on renal dialysis: Secondary | ICD-10-CM

## 2016-01-23 DIAGNOSIS — E785 Hyperlipidemia, unspecified: Secondary | ICD-10-CM | POA: Diagnosis present

## 2016-01-23 DIAGNOSIS — D638 Anemia in other chronic diseases classified elsewhere: Secondary | ICD-10-CM | POA: Diagnosis present

## 2016-01-23 DIAGNOSIS — G2581 Restless legs syndrome: Secondary | ICD-10-CM | POA: Diagnosis present

## 2016-01-23 LAB — PROTIME-INR
INR: 1.27
Prothrombin Time: 16 seconds — ABNORMAL HIGH (ref 11.4–15.2)

## 2016-01-23 LAB — CBC WITH DIFFERENTIAL/PLATELET
Basophils Absolute: 0 10*3/uL (ref 0.0–0.1)
Basophils Relative: 0 %
EOS ABS: 0.5 10*3/uL (ref 0.0–0.7)
EOS PCT: 8 %
HCT: 27.3 % — ABNORMAL LOW (ref 39.0–52.0)
Hemoglobin: 8.8 g/dL — ABNORMAL LOW (ref 13.0–17.0)
LYMPHS ABS: 0.5 10*3/uL — AB (ref 0.7–4.0)
Lymphocytes Relative: 8 %
MCH: 28.8 pg (ref 26.0–34.0)
MCHC: 32.2 g/dL (ref 30.0–36.0)
MCV: 89.2 fL (ref 78.0–100.0)
MONO ABS: 0.8 10*3/uL (ref 0.1–1.0)
MONOS PCT: 12 %
Neutro Abs: 4.9 10*3/uL (ref 1.7–7.7)
Neutrophils Relative %: 73 %
PLATELETS: 110 10*3/uL — AB (ref 150–400)
RBC: 3.06 MIL/uL — AB (ref 4.22–5.81)
RDW: 18 % — AB (ref 11.5–15.5)
WBC: 6.8 10*3/uL (ref 4.0–10.5)

## 2016-01-23 LAB — COMPREHENSIVE METABOLIC PANEL
ALT: 19 U/L (ref 17–63)
ANION GAP: 12 (ref 5–15)
AST: 22 U/L (ref 15–41)
Albumin: 3.9 g/dL (ref 3.5–5.0)
Alkaline Phosphatase: 61 U/L (ref 38–126)
BUN: 89 mg/dL — ABNORMAL HIGH (ref 6–20)
CHLORIDE: 112 mmol/L — AB (ref 101–111)
CO2: 16 mmol/L — AB (ref 22–32)
Calcium: 8.6 mg/dL — ABNORMAL LOW (ref 8.9–10.3)
Creatinine, Ser: 5.87 mg/dL — ABNORMAL HIGH (ref 0.61–1.24)
GFR calc non Af Amer: 9 mL/min — ABNORMAL LOW (ref >60)
GFR, EST AFRICAN AMERICAN: 11 mL/min — AB (ref >60)
Glucose, Bld: 123 mg/dL — ABNORMAL HIGH (ref 65–99)
POTASSIUM: 4.2 mmol/L (ref 3.5–5.1)
SODIUM: 140 mmol/L (ref 135–145)
Total Bilirubin: 0.4 mg/dL (ref 0.3–1.2)
Total Protein: 6.2 g/dL — ABNORMAL LOW (ref 6.5–8.1)

## 2016-01-23 LAB — BRAIN NATRIURETIC PEPTIDE: B NATRIURETIC PEPTIDE 5: 137.8 pg/mL — AB (ref 0.0–100.0)

## 2016-01-23 LAB — I-STAT TROPONIN, ED: TROPONIN I, POC: 0.02 ng/mL (ref 0.00–0.08)

## 2016-01-23 MED ORDER — VITAMIN B-12 100 MCG PO TABS
50.0000 ug | ORAL_TABLET | Freq: Every day | ORAL | Status: DC
  Administered 2016-01-24 – 2016-02-03 (×10): 50 ug via ORAL
  Filled 2016-01-23 (×15): qty 1

## 2016-01-23 MED ORDER — CALCIUM CARBONATE ANTACID 500 MG PO CHEW: 2.0000 | CHEWABLE_TABLET | Freq: Two times a day (BID) | ORAL | Status: DC | PRN

## 2016-01-23 MED ORDER — AMLODIPINE BESYLATE 10 MG PO TABS
10.0000 mg | ORAL_TABLET | Freq: Every day | ORAL | Status: DC
  Administered 2016-01-24 – 2016-02-03 (×11): 10 mg via ORAL
  Filled 2016-01-23 (×11): qty 1

## 2016-01-23 MED ORDER — OXYCODONE HCL 5 MG PO TABS
5.0000 mg | ORAL_TABLET | Freq: Four times a day (QID) | ORAL | Status: DC | PRN
  Filled 2016-01-23: qty 1

## 2016-01-23 MED ORDER — IXAZOMIB CITRATE 3 MG PO CAPS: 3.0000 mg | ORAL_CAPSULE | ORAL | Status: DC

## 2016-01-23 MED ORDER — SODIUM CHLORIDE 0.9% FLUSH
3.0000 mL | Freq: Two times a day (BID) | INTRAVENOUS | Status: DC
  Administered 2016-01-23 – 2016-02-03 (×21): 3 mL via INTRAVENOUS

## 2016-01-23 MED ORDER — ONDANSETRON HCL 4 MG PO TABS: 4.0000 mg | ORAL_TABLET | Freq: Four times a day (QID) | ORAL | Status: DC | PRN

## 2016-01-23 MED ORDER — FUROSEMIDE 10 MG/ML IJ SOLN
80.0000 mg | Freq: Once | INTRAMUSCULAR | Status: AC
Start: 2016-01-23 — End: 2016-01-23
  Administered 2016-01-23: 80 mg via INTRAVENOUS
  Filled 2016-01-23: qty 8

## 2016-01-23 MED ORDER — ONDANSETRON HCL 4 MG PO TABS: 8.0000 mg | ORAL_TABLET | Freq: Three times a day (TID) | ORAL | Status: DC | PRN

## 2016-01-23 MED ORDER — ACETAMINOPHEN 650 MG RE SUPP: 650.0000 mg | Freq: Four times a day (QID) | RECTAL | Status: DC | PRN

## 2016-01-23 MED ORDER — LORAZEPAM 1 MG PO TABS: 1.0000 mg | ORAL_TABLET | Freq: Every day | ORAL | Status: DC | PRN

## 2016-01-23 MED ORDER — HEPARIN SODIUM (PORCINE) 5000 UNIT/ML IJ SOLN
5000.0000 [IU] | Freq: Three times a day (TID) | INTRAMUSCULAR | Status: DC
  Administered 2016-01-23 – 2016-02-01 (×15): 5000 [IU] via SUBCUTANEOUS
  Filled 2016-01-23 (×18): qty 1

## 2016-01-23 MED ORDER — CARVEDILOL 25 MG PO TABS
25.0000 mg | ORAL_TABLET | Freq: Two times a day (BID) | ORAL | Status: DC
  Administered 2016-01-24 – 2016-01-31 (×15): 25 mg via ORAL
  Filled 2016-01-23 (×16): qty 1

## 2016-01-23 MED ORDER — ACETAMINOPHEN 325 MG PO TABS
650.0000 mg | ORAL_TABLET | Freq: Four times a day (QID) | ORAL | Status: DC | PRN
  Administered 2016-01-30 – 2016-01-31 (×2): 650 mg via ORAL
  Filled 2016-01-23 (×2): qty 2

## 2016-01-23 MED ORDER — HYDROCODONE-ACETAMINOPHEN 5-325 MG PO TABS: 1.0000 | ORAL_TABLET | ORAL | Status: DC | PRN

## 2016-01-23 MED ORDER — VITAMIN D 1000 UNITS PO TABS
3000.0000 [IU] | ORAL_TABLET | Freq: Every day | ORAL | Status: DC
  Administered 2016-01-24 – 2016-02-04 (×12): 3000 [IU] via ORAL
  Filled 2016-01-23 (×12): qty 3

## 2016-01-23 MED ORDER — ALBUTEROL SULFATE (2.5 MG/3ML) 0.083% IN NEBU: 5.0000 mg | INHALATION_SOLUTION | Freq: Once | RESPIRATORY_TRACT | Status: DC

## 2016-01-23 MED ORDER — LORATADINE 10 MG PO TABS
10.0000 mg | ORAL_TABLET | Freq: Every day | ORAL | Status: DC
  Administered 2016-01-24 – 2016-02-04 (×12): 10 mg via ORAL
  Filled 2016-01-23 (×12): qty 1

## 2016-01-23 MED ORDER — FUROSEMIDE 10 MG/ML IJ SOLN
80.0000 mg | Freq: Two times a day (BID) | INTRAMUSCULAR | Status: DC
  Administered 2016-01-24 – 2016-01-25 (×3): 80 mg via INTRAVENOUS
  Filled 2016-01-23 (×3): qty 8

## 2016-01-23 MED ORDER — VITAMIN D 1000 UNITS PO TABS: 1000.0000 [IU] | ORAL_TABLET | Freq: Every day | ORAL | Status: DC

## 2016-01-23 MED ORDER — FLUTICASONE PROPIONATE 50 MCG/ACT NA SUSP
1.0000 | Freq: Every day | NASAL | Status: DC
  Administered 2016-01-24 – 2016-02-03 (×11): 1 via NASAL
  Filled 2016-01-23 (×2): qty 16

## 2016-01-23 MED ORDER — VITAMIN B-12 1000 MCG/15ML PO LIQD: 1.0000 mL | Freq: Every day | ORAL | Status: DC

## 2016-01-23 MED ORDER — HYDRALAZINE HCL 50 MG PO TABS
100.0000 mg | ORAL_TABLET | Freq: Three times a day (TID) | ORAL | Status: DC
  Administered 2016-01-23 – 2016-02-03 (×29): 100 mg via ORAL
  Filled 2016-01-23 (×31): qty 2

## 2016-01-23 MED ORDER — ONDANSETRON HCL 4 MG/2ML IJ SOLN
4.0000 mg | Freq: Four times a day (QID) | INTRAMUSCULAR | Status: DC | PRN
  Administered 2016-01-29: 4 mg via INTRAVENOUS
  Filled 2016-01-23: qty 2

## 2016-01-23 MED ORDER — GABAPENTIN 250 MG/5ML PO SOLN
500.0000 mg | Freq: Every day | ORAL | Status: DC
  Administered 2016-01-23 – 2016-01-24 (×2): 500 mg via ORAL
  Filled 2016-01-23 (×4): qty 10

## 2016-01-23 MED ORDER — ASPIRIN EC 81 MG PO TBEC
81.0000 mg | DELAYED_RELEASE_TABLET | Freq: Every day | ORAL | Status: DC
  Administered 2016-01-24 – 2016-02-04 (×12): 81 mg via ORAL
  Filled 2016-01-23 (×12): qty 1

## 2016-01-23 NOTE — ED Notes (Signed)
Attempted report x2.  Name and callback number provided.   

## 2016-01-23 NOTE — ED Notes (Signed)
Waiting on Admission. Verbalizes no other needs at this time. Pt waiting for meal tray

## 2016-01-23 NOTE — H&P (Signed)
History and Physical    Jose Garrett MVE:[id number] DOB: Jun 06, 1950 DOA: 01/23/2016  PCP: [name], MD  Patient coming from: Home  Chief Complaint: DOE and lower extremity edema  HPI: Jose Garrett is a 65 y.o. male with medical history significant of AL Amyloidosis, CKD stage 4-5. Patient follows with Dr. [name] for his CKD, and a right forearm fistula created on 12/27/2015 for anticipation of dialysis. Patient reported for the several weeks lower extremity edema was worsening but for the past 10 days got really worse, he has scrotal edema, has DOE and PND and orthopnea. His Lasix was increased recently from 40 to 80 mg by Dr. [name] but he continues to have worsening of his fluid status so he came into the ED for further management.  ED Course:  Vitals: WNL Labs: WNL except mild acidosis bicarbonate 16, creatinine 5.87, BNP of 137 and hemoglobin of 8.8 Imaging: CXR showed new bilateral pleural effusion and mild interstitial edema consistent with CHF. Interventions: 80 mg of IV Lasix given in the ED  Review of Systems:  Constitutional: negative for anorexia, fevers and sweats Eyes: negative for irritation, redness and visual disturbance Ears, nose, mouth, throat, and face: negative for earaches, epistaxis, nasal congestion and sore throat Respiratory: negative for cough, dyspnea on exertion, sputum and wheezing Cardiovascular: DOE, orthopnea and PND per history of present illness. Gastrointestinal: negative for abdominal pain, constipation, diarrhea, melena, nausea and vomiting Genitourinary:negative for dysuria, frequency and hematuria Hematologic/lymphatic: negative for bleeding, easy bruising and lymphadenopathy Musculoskeletal:negative for arthralgias, muscle weakness and stiff joints Neurological: negative for coordination problems, gait problems, headaches and weakness Endocrine: negative for diabetic symptoms including polydipsia, polyuria and weight  loss Allergic/Immunologic: negative for anaphylaxis, hay fever and urticaria  Past Medical History:  Diagnosis Date  . Amyloidosis ([place])   . Anemia Dx 2016  . Chronic diastolic CHF (congestive heart failure), NYHA class 1 ([place])    /hospital problem list 09/08/2014  . Chronic renal disease, stage III    /hospital problem list 09/08/2014  . Diarrhea   . Dyspnea    with exertion  . Enlarged heart 2015  . Family history of adverse reaction to anesthesia    "my daughter can't take certain anesthesia agents" (09/08/2014)  . GERD (gastroesophageal reflux disease)   . History of blood transfusion 09/08/2014   "got hematoma after renal biopsy & HgB dropped"  . Hyperlipidemia Dx 2012  . Hypertension Dx 2012  . Renal disorder    "there is a spot on my kidney" (09/08/2014)  . Restless legs     Past Surgical History:  Procedure Laterality Date  . AV FISTULA PLACEMENT Right 12/27/2015   Procedure: Right Arm ARTERIOVENOUS (AV) FISTULA CREATION;  Surgeon: Angelia Mould, MD;  Location: [place];  Service: Vascular;  Laterality: Right;  . COLONOSCOPY    . LAPAROSCOPIC CHOLECYSTECTOMY  03/2011  . RENAL BIOPSY, PERCUTANEOUS Right 09/08/2014  . TONSILLECTOMY  ~ 1960     reports that he quit smoking about 4 years ago. His smoking use included Cigarettes. He has a 80.00 pack-year smoking history. He has never used smokeless tobacco. He reports that he does not drink alcohol or use drugs.  Allergies  Allergen Reactions  . No Known Allergies     Family History  Problem Relation Age of Onset  . Hypertension Mother   . Diabetes Mother   . Heart disease Mother   . Skin cancer Mother   . Hypertension Father   . Diabetes  Father   . Diabetes Sister     Prior to Admission medications   Medication Sig Start Date End Date Taking? Authorizing Provider  acyclovir (ZOVIRAX) 400 MG tablet TAKE 1 TABLET BY MOUTH DAILY. (DOSE ADJUSTED FOR RENAL FUNCTION) 06/01/15   [name], MD  amLODipine  (NORVASC) 10 MG tablet Take 1 tablet (10 mg total) by mouth daily. 06/30/15   [name], MD  aspirin EC 81 MG tablet Take 1 tablet (81 mg total) by mouth daily. 01/14/15   [name], MD  calcium carbonate (TUMS - DOSED IN MG ELEMENTAL CALCIUM) 500 MG chewable tablet Chew 2 tablets by mouth 2 (two) times daily as needed for indigestion or heartburn.    Historical Provider, MD  carvedilol (COREG) 25 MG tablet Take 1 tablet (25 mg total) by mouth 2 (two) times daily with a meal. 08/22/15   Josalyn Funches, MD  cetirizine (ZYRTEC) 5 MG tablet Take 1 tablet (5 mg total) by mouth daily. 08/22/15   Josalyn Funches, MD  cholecalciferol (VITAMIN D) 1000 units tablet Take 1,000 Units by mouth daily.    Historical Provider, MD  Cyanocobalamin (VITAMIN B-12) 1000 MCG/15ML LIQD Take 1 mL by mouth daily.    Historical Provider, MD  Epoetin Alfa (PROCRIT IJ) Inject as directed every 14 (fourteen) days. Done at [facility] Next dose due 12-30-15    Historical Provider, MD  [facility] 50 MCG/ACT nasal spray Place 1 spray into both nostrils daily as needed. 12/27/15   Alvia Grove, PA-C  furosemide (LASIX) 40 MG tablet Take 80 mg by mouth 2 (two) times daily.    Historical Provider, MD  gabapentin (NEURONTIN) 250 MG/5ML solution Take 10 mLs (500 mg total) by mouth at bedtime. 12/20/15   [name], MD  hydrALAZINE (APRESOLINE) 100 MG tablet Take 1 tablet (100 mg total) by mouth 3 (three) times daily. 06/30/15   Josalyn Funches, MD  ixazomib citrate (NINLARO) 3 MG capsule Take 1 capsule (3 mg total) by mouth once a week. (on D1,8 and 15 every 28 days)Take on an empty stomach 1hr before or 2hrs after food. Do not crush, chew, or open. 11/29/15   [name], MD  LORazepam (ATIVAN) 1 MG tablet Take 1 mg by mouth daily as needed (nausea).     Historical Provider, MD  ondansetron (ZOFRAN) 8 MG tablet Take 8 mg by mouth every 8 (eight) hours as needed for nausea or vomiting. Reported on  07/08/2015    Historical Provider, MD  oxyCODONE (OXY IR/ROXICODONE) 5 MG immediate release tablet Take 1 tablet (5 mg total) by mouth every 6 (six) hours as needed for severe pain. 12/27/15   Alvia Grove, PA-C    Physical Exam:  Vitals:   01/23/16 1538 01/23/16 1600 01/23/16 1633 01/23/16 1700  BP: 159/65 142/55 153/77 168/66  Pulse: 72 69 71 74  Resp: 16 17 17 18   Temp: 98.3 F (36.8 C)     TempSrc: Oral     SpO2: 97% 97% 95% 94%  Weight:      Height:        Constitutional: NAD, calm, comfortable Eyes: PERRL, lids and conjunctivae normal ENMT: Mucous membranes are moist. Posterior pharynx clear of any exudate or lesions.Normal dentition.  Neck: normal, supple, no masses, no thyromegaly Respiratory: clear to auscultation bilaterally, no wheezing, no crackles. Normal respiratory effort. No accessory muscle use.  Cardiovascular: Regular rate and rhythm, no murmurs / rubs / gallops. No extremity edema. 2+  pedal pulses. No carotid bruits.  Abdomen: no tenderness, no masses palpated. No hepatosplenomegaly. Bowel sounds positive.  Musculoskeletal: no clubbing / cyanosis. No joint deformity upper and lower extremities. Good ROM, no contractures. Normal muscle tone.  Skin: no rashes, lesions, ulcers. No induration Neurologic: CN 2-12 grossly intact. Sensation intact, DTR normal. Strength 5/5 in all 4.  Psychiatric: Normal judgment and insight. Alert and oriented x 3. Normal mood.   Labs on Admission: I have personally reviewed following labs and imaging studies  CBC:  Recent Labs Lab 01/19/16 0931 01/23/16 1323  WBC  --  6.8  NEUTROABS  --  4.9  HGB 8.6* 8.8*  HCT  --  27.3*  MCV  --  89.2  PLT  --  161*   Basic Metabolic Panel:  Recent Labs Lab 01/23/16 1323  NA 140  K 4.2  CL 112*  CO2 16*  GLUCOSE 123*  BUN 89*  CREATININE 5.87*  CALCIUM 8.6*   GFR: Estimated Creatinine Clearance: 14.9 mL/min (by C-G formula based on SCr of 5.87 mg/dL (H)). Liver Function  Tests:  Recent Labs Lab 01/23/16 1323  AST 22  ALT 19  ALKPHOS 61  BILITOT 0.4  PROT 6.2*  ALBUMIN 3.9   No results for input(s): LIPASE, AMYLASE in the last 168 hours. No results for input(s): AMMONIA in the last 168 hours. Coagulation Profile: No results for input(s): INR, PROTIME in the last 168 hours. Cardiac Enzymes: No results for input(s): CKTOTAL, CKMB, CKMBINDEX, TROPONINI in the last 168 hours. BNP (last 3 results) No results for input(s): PROBNP in the last 8760 hours. HbA1C: No results for input(s): HGBA1C in the last 72 hours. CBG: No results for input(s): GLUCAP in the last 168 hours. Lipid Profile: No results for input(s): CHOL, HDL, LDLCALC, TRIG, CHOLHDL, LDLDIRECT in the last 72 hours. Thyroid Function Tests: No results for input(s): TSH, T4TOTAL, FREET4, T3FREE, THYROIDAB in the last 72 hours. Anemia Panel: No results for input(s): VITAMINB12, FOLATE, FERRITIN, TIBC, IRON, RETICCTPCT in the last 72 hours. Urine analysis: No results found for: COLORURINE, APPEARANCEUR, LABSPEC, PHURINE, GLUCOSEU, HGBUR, BILIRUBINUR, KETONESUR, PROTEINUR, UROBILINOGEN, NITRITE, LEUKOCYTESUR Sepsis Labs: !!!!!!!!!!!!!!!!!!!!!!!!!!!!!!!!!!!!!!!!!!!! Invalid input(s): PROCALCITONIN, LACTICIDVEN No results found for this or any previous visit (from the past 240 hour(s)).   Radiological Exams on Admission: Dg Chest 2 View  Result Date: 01/23/2016 CLINICAL DATA:  Increased shortness of breath over the past month. History of hypertension, CHF. EXAM: CHEST  2 VIEW COMPARISON:  PA and lateral chest x-ray of August 14, 2015 FINDINGS: The lungs are well-expanded. There small bilateral pleural effusions which are new. The interstitial markings are increased and more conspicuous than on the previous study. The heart is top-normal in size. The pulmonary vascularity is prominent centrally but stable. There is calcification in the wall of the aortic arch. The bony thorax exhibits no acute  abnormality. IMPRESSION: New bilateral pleural effusions and mild interstitial edema consistent with CHF. This is superimposed upon chronic bronchitic change. No alveolar pneumonia. Thoracic aortic atherosclerosis. Electronically Signed   By: David  Martinique M.D.   On: 01/23/2016 14:00    EKG: Independently reviewed.   Assessment/Plan Principal Problem:   Acute pulmonary edema (HCC) Active Problems:   DOE (dyspnea on exertion)   AL amyloidosis   CKD (chronic kidney disease) stage 4, GFR 15-29 ml/min (HCC)   Acute on chronic diastolic CHF (congestive heart failure) (HCC)   Fluid overload, unspecified    Acute pulmonary edema -Patient presented with SOB, DOE, orthopnea and  massive lower extremity edema edema. -CXR showed bilateral effusion and interstitial edema, BNP is slightly elevated at 130. -This is secondary to stage IV-V chronic kidney disease, seems to be worsening with less urinary output. -Started on IV diuresis., provide supplemental oxygen as needed.  Stage IV-V CKD -Patient has CKD stage V secondary nephrotic range disease from to biopsy-proven AL amyloidosis. -Kidney biopsy done on 09/08/2014 showed AL amyloid. Patient was on 40 mg of Lasix twice a day. -Recently appears to be worsening, right forearm fistula created on 12/27/2015. -Lasix increased to 80 mg twice a day, will follow intake/output and daily weights. Nephrology to evaluate.  Acute on chronic diastolic CHF -On last echo done in September 2017 LVEF is 60-65% and grade 2 diastolic dysfunction. -Acute pulmonary edema patient having is likely secondary to fluid overload from worsening of his kidney disease. -This is now manifests as acute on chronic diastolic CHF, this will improve as the fluid overload improves. -Continue home medication including Coreg and hydralazine.  AL amyloidosis -With renal involvement, patient was following with Dr. Velvet Bathe. -Is on ixazomib citrate, was on dexamethasone as well for  that.  DOE -The massive lower extremity edema, DOE normal from the fluid overload. -Started high-dose Lasix, nephrology to evaluate in the morning.  DVT prophylaxis: SQ Heparin Code Status: Full Family Communication: Plan D/W patient with his daughter and ex-wife at bedside. Disposition Plan: Telemetry Consults called: Nephrology Admission status: Telemetry, inpatient   [facility] A MD [facility] Pager [phone]  If 7PM-7AM, please contact night-coverage [email or web address] Password [email or web address]  01/23/2016, 5:12 PM

## 2016-01-23 NOTE — ED Notes (Signed)
Admitting MD at the bedside.  

## 2016-01-23 NOTE — ED Provider Notes (Signed)
[place] DEPT Provider Note   CSN: [id number] Arrival date & time: 01/23/16  1258     History   Chief Complaint Chief Complaint  Patient presents with  . Shortness of Breath    HPI Jose Garrett is a 65 y.o. male.  HPI    Pt with hx amyloidosis, HTN, CHF, CKD with recent AV fistula placed, HTN, HLD p/w gradually worsening SOB and peripheral edema over the past month.  He now has significant edema in the legs and scrotum.  Is SOB with minimal exertion, even moving around in the stretcher.  Denies fevers, cough, chest pain, abdominal pain or swelling, difficulty urinating.  His nephrologist Dr [name] doubled his Lasix for 80mg  PO BID 7-10 days ago without improvement.    Past Medical History:  Diagnosis Date  . Amyloidosis ([place])   . Anemia Dx 2016  . Chronic diastolic CHF (congestive heart failure), NYHA class 1 ([place])    /hospital problem list 09/08/2014  . Chronic renal disease, stage III    /hospital problem list 09/08/2014  . Diarrhea   . Dyspnea    with exertion  . Enlarged heart 2015  . Family history of adverse reaction to anesthesia    "my daughter can't take certain anesthesia agents" (09/08/2014)  . GERD (gastroesophageal reflux disease)   . History of blood transfusion 09/08/2014   "got hematoma after renal biopsy & HgB dropped"  . Hyperlipidemia Dx 2012  . Hypertension Dx 2012  . Renal disorder    "there is a spot on my kidney" (09/08/2014)  . Restless legs     Patient Active Problem List   Diagnosis Date Noted  . Acute on chronic diastolic CHF (congestive heart failure) ([place]) 01/23/2016  . Fluid overload, unspecified 01/23/2016  . Acute pulmonary edema ([place]) 01/23/2016  . Skin tear of right forearm without complication 95/11/3265  . Iron deficiency anemia 11/22/2015  . Mild aortic stenosis 08/22/2015  . CKD (chronic kidney disease) stage 4, GFR 15-29 ml/min (HCC) 08/01/2015  . B12 deficiency 07/19/2015  . Seasonal allergies 06/30/2015  .  Back pain 03/18/2015  . Hip pain 02/18/2015  . AL amyloidosis 09/25/2014  . Anemia of chronic disease 09/25/2014  . Abnormal bruising 09/25/2014  . Renal hematoma, right 09/08/2014  . Chronic diastolic heart failure, NYHA class 1 ([place]) 09/08/2014  . Hematuria   . Proteinuria   . Vitamin D deficiency 01/21/2014  . Hypertriglyceridemia 01/21/2014  . Essential hypertension, benign 11/06/2013  . Dependent edema 11/06/2013  . DOE (dyspnea on exertion) 11/06/2013  . Other malaise and fatigue 11/06/2013    Past Surgical History:  Procedure Laterality Date  . AV FISTULA PLACEMENT Right 12/27/2015   Procedure: Right Arm ARTERIOVENOUS (AV) FISTULA CREATION;  Surgeon: Angelia Mould, MD;  Location: [place];  Service: Vascular;  Laterality: Right;  . COLONOSCOPY    . LAPAROSCOPIC CHOLECYSTECTOMY  03/2011  . RENAL BIOPSY, PERCUTANEOUS Right 09/08/2014  . TONSILLECTOMY  ~ 1960       Home Medications    Prior to Admission medications   Medication Sig Start Date End Date Taking? Authorizing Provider  acyclovir (ZOVIRAX) 400 MG tablet TAKE 1 TABLET BY MOUTH DAILY. (DOSE ADJUSTED FOR RENAL FUNCTION) 06/01/15  Yes [name], MD  amLODipine (NORVASC) 10 MG tablet Take 1 tablet (10 mg total) by mouth daily. Patient taking differently: Take 10 mg by mouth daily after lunch.  06/30/15  Yes [name], MD  aspirin EC 81 MG tablet Take 1 tablet (  81 mg total) by mouth daily. 01/14/15  Yes [name], MD  calcium carbonate (TUMS - DOSED IN MG ELEMENTAL CALCIUM) 500 MG chewable tablet Chew 1-2 tablets by mouth 2 (two) times daily as needed for indigestion or heartburn.    Yes Historical Provider, MD  carvedilol (COREG) 25 MG tablet Take 1 tablet (25 mg total) by mouth 2 (two) times daily with a meal. 08/22/15  Yes Josalyn Funches, MD  cetirizine (ZYRTEC) 10 MG tablet Take 10 mg by mouth daily.   Yes Historical Provider, MD  Cholecalciferol (VITAMIN D3) 3000 units TABS Take 3,000  Units by mouth daily.   Yes Historical Provider, MD  Cyanocobalamin (VITAMIN B-12) 1000 MCG/15ML LIQD Take 1 mL by mouth daily.   Yes Historical Provider, MD  Epoetin Alfa (PROCRIT IJ) Inject as directed every 14 (fourteen) days. Done at [facility] Last injection 01/19/16   Yes Historical Provider, MD  FLONASE 50 MCG/ACT nasal spray Place 1 spray into both nostrils daily as needed. Patient taking differently: Place 1 spray into both nostrils daily as needed for allergies or rhinitis.  12/27/15  Yes Alvia Grove, PA-C  furosemide (LASIX) 80 MG tablet Take 80 mg by mouth 2 (two) times daily.   Yes Historical Provider, MD  gabapentin (NEURONTIN) 250 MG/5ML solution Take 10 mLs (500 mg total) by mouth at bedtime. 12/20/15  Yes [name], MD  hydrALAZINE (APRESOLINE) 100 MG tablet Take 1 tablet (100 mg total) by mouth 3 (three) times daily. 06/30/15  Yes Josalyn Funches, MD  ixazomib citrate (NINLARO) 3 MG capsule Take 1 capsule (3 mg total) by mouth once a week. (on D1,8 and 15 every 28 days)Take on an empty stomach 1hr before or 2hrs after food. Do not crush, chew, or open. Patient taking differently: Take 3 mg by mouth every 14 (fourteen) days. Every other Thursday 11/29/15  Yes [name], MD  LORazepam (ATIVAN) 1 MG tablet Take 1 mg by mouth daily as needed for anxiety (nausea).    Yes Historical Provider, MD  ondansetron (ZOFRAN) 8 MG tablet Take 8 mg by mouth every 8 (eight) hours as needed for nausea or vomiting. Reported on 07/08/2015   Yes Historical Provider, MD  oxyCODONE (OXY IR/ROXICODONE) 5 MG immediate release tablet Take 1 tablet (5 mg total) by mouth every 6 (six) hours as needed for severe pain. 12/27/15  Yes Alvia Grove, PA-C  cetirizine (ZYRTEC) 5 MG tablet Take 1 tablet (5 mg total) by mouth daily. 08/22/15   Josalyn Funches, MD  cholecalciferol (VITAMIN D) 1000 units tablet Take 1,000 Units by mouth daily.    Historical Provider, MD    Family  History Family History  Problem Relation Age of Onset  . Hypertension Mother   . Diabetes Mother   . Heart disease Mother   . Skin cancer Mother   . Hypertension Father   . Diabetes Father   . Diabetes Sister     Social History Social History  Substance Use Topics  . Smoking status: Former Smoker    Packs/day: 2.00    Years: 40.00    Types: Cigarettes    Quit date: 03/19/2011  . Smokeless tobacco: Never Used  . Alcohol use No     Allergies   No known allergies   Review of Systems Review of Systems  All other systems reviewed and are negative.    Physical Exam Updated Vital Signs BP 168/66   Pulse 74   Temp 98.3  F (36.8 C) (Oral)   Resp 18   Ht 5\' 10"  (1.778 m)   Wt 95.3 kg   SpO2 94%   BMI 30.13 kg/m   Physical Exam  Constitutional: He appears well-developed and well-nourished. No distress.  HENT:  Head: Normocephalic and atraumatic.  Neck: Neck supple.  Cardiovascular: Normal rate, regular rhythm and intact distal pulses.   Pulmonary/Chest: Effort normal. No respiratory distress. He has decreased breath sounds. He has no wheezes. He has no rales.  Abdominal: Soft. He exhibits no distension and no mass. There is no tenderness. There is no rebound and no guarding.  Musculoskeletal:  Significant pitting edema throughout bilateral legs.  Scrotum and penis largely edematous.  Nontender.    Neurological: He is alert. He exhibits normal muscle tone.  Skin: He is not diaphoretic.  Nursing note and vitals reviewed.    ED Treatments / Results  Labs (all labs ordered are listed, but only abnormal results are displayed) Labs Reviewed  CBC WITH DIFFERENTIAL/PLATELET - Abnormal; Notable for the following:       Result Value   RBC 3.06 (*)    Hemoglobin 8.8 (*)    HCT 27.3 (*)    RDW 18.0 (*)    Platelets 110 (*)    Lymphs Abs 0.5 (*)    All other components within normal limits  COMPREHENSIVE METABOLIC PANEL - Abnormal; Notable for the following:     Chloride 112 (*)    CO2 16 (*)    Glucose, Bld 123 (*)    BUN 89 (*)    Creatinine, Ser 5.87 (*)    Calcium 8.6 (*)    Total Protein 6.2 (*)    GFR calc non Af Amer 9 (*)    GFR calc Af Amer 11 (*)    All other components within normal limits  BRAIN NATRIURETIC PEPTIDE - Abnormal; Notable for the following:    B Natriuretic Peptide 137.8 (*)    All other components within normal limits  I-STAT TROPOININ, ED    EKG  EKG Interpretation None       Radiology Dg Chest 2 View  Result Date: 01/23/2016 CLINICAL DATA:  Increased shortness of breath over the past month. History of hypertension, CHF. EXAM: CHEST  2 VIEW COMPARISON:  PA and lateral chest x-ray of August 14, 2015 FINDINGS: The lungs are well-expanded. There small bilateral pleural effusions which are new. The interstitial markings are increased and more conspicuous than on the previous study. The heart is top-normal in size. The pulmonary vascularity is prominent centrally but stable. There is calcification in the wall of the aortic arch. The bony thorax exhibits no acute abnormality. IMPRESSION: New bilateral pleural effusions and mild interstitial edema consistent with CHF. This is superimposed upon chronic bronchitic change. No alveolar pneumonia. Thoracic aortic atherosclerosis. Electronically Signed   By: David  Martinique M.D.   On: 01/23/2016 14:00    Procedures Procedures (including critical care time)  Medications Ordered in ED Medications  albuterol (PROVENTIL) (2.5 MG/3ML) 0.083% nebulizer solution 5 mg (0 mg Nebulization Hold 01/23/16 1621)  furosemide (LASIX) injection 80 mg (80 mg Intravenous Given 01/23/16 1634)     Initial Impression / Assessment and Plan / ED Course  I have reviewed the triage vital signs and the nursing notes.  Pertinent labs & imaging results that were available during my care of the patient were reviewed by me and considered in my medical decision making (see chart for details).  Clinical  Course as of  Jan 23 1747  Mon Jan 23, 2016  1646 I spoke with nephrology and they will see pt first thing in the morning.    [EW]    Clinical Course User Index [EW] Clayton Bibles, PA-C    Pt with amyloidosis, HTN, HLD, CKD anticipating dialysis soon with AV fistula placed 12/27/15.  Has had gradually worsening SOB and peripheral edema x 1 month, resistant to increase in lasix dosage.  Significant edema on exam.  CXR c/w heart failure/fluid overload from worsening renal failure apparent by labs.  Creatinine 5.8, BUN 89.  K is normal (4.7).  Anemia of chronic disease is baseline.  Admitted to [facility], Dr Hartford Poli accepting, nephrology to consult in the morning.    Final Clinical Impressions(s) / ED Diagnoses   Final diagnoses:  Heart failure, unspecified heart failure chronicity, unspecified heart failure type ([place])  Acute renal failure superimposed on chronic kidney disease, unspecified CKD stage, unspecified acute renal failure type ([place])  Hypervolemia, unspecified hypervolemia type    New Prescriptions New Prescriptions   No medications on file     Clayton Bibles, PA-C 01/23/16 Gillermo [place], MD 01/24/16 [phone]

## 2016-01-23 NOTE — ED Notes (Signed)
R forearm AV fistula thrill and bruit intact.  Not accessed yet.  Site healing well.

## 2016-01-23 NOTE — ED Notes (Signed)
Attempted report x1.  Name and callback number provided.   

## 2016-01-23 NOTE — ED Triage Notes (Signed)
Pt presents with worsening shortness of breath and edema x 1 month.  Pt reports he is being "prepped" for hemodialysis, received AVF in October.  Pt reports he increased lasix x 1 week ago without relief. Pt reports dry cough.

## 2016-01-24 LAB — BASIC METABOLIC PANEL
ANION GAP: 11 (ref 5–15)
BUN: 90 mg/dL — AB (ref 6–20)
CALCIUM: 8.5 mg/dL — AB (ref 8.9–10.3)
CO2: 18 mmol/L — ABNORMAL LOW (ref 22–32)
Chloride: 113 mmol/L — ABNORMAL HIGH (ref 101–111)
Creatinine, Ser: 5.95 mg/dL — ABNORMAL HIGH (ref 0.61–1.24)
GFR calc Af Amer: 10 mL/min — ABNORMAL LOW (ref >60)
GFR, EST NON AFRICAN AMERICAN: 9 mL/min — AB (ref >60)
GLUCOSE: 106 mg/dL — AB (ref 65–99)
POTASSIUM: 4.1 mmol/L (ref 3.5–5.1)
SODIUM: 142 mmol/L (ref 135–145)

## 2016-01-24 LAB — CBC
HCT: 26 % — ABNORMAL LOW (ref 39.0–52.0)
HEMOGLOBIN: 8.2 g/dL — AB (ref 13.0–17.0)
MCH: 28.5 pg (ref 26.0–34.0)
MCHC: 31.5 g/dL (ref 30.0–36.0)
MCV: 90.3 fL (ref 78.0–100.0)
Platelets: 115 10*3/uL — ABNORMAL LOW (ref 150–400)
RBC: 2.88 MIL/uL — ABNORMAL LOW (ref 4.22–5.81)
RDW: 18.3 % — AB (ref 11.5–15.5)
WBC: 8 10*3/uL (ref 4.0–10.5)

## 2016-01-24 LAB — TSH: TSH: 1.035 u[IU]/mL (ref 0.350–4.500)

## 2016-01-24 NOTE — Progress Notes (Signed)
PROGRESS NOTE  Jose Garrett  ZOX:[id number] DOB: 1951/01/11 DOA: 01/23/2016 PCP: [name], MD Outpatient Specialists:  Subjective: Feels much better, still has SOB, still can not lay flat.  Brief Narrative:  Jose Garrett is a 65 y.o. male with medical history significant of AL Amyloidosis, CKD stage 4-5. Patient follows with Dr. [name] for his CKD, and a right forearm fistula created on 12/27/2015 for anticipation of dialysis. Patient reported for the several weeks lower extremity edema was worsening but for the past 10 days got really worse, he has scrotal edema, has DOE and PND and orthopnea. His Lasix was increased recently from 40 to 80 mg by Dr. [name] but he continues to have worsening of his fluid status so he came into the ED for further management.  Assessment & Plan:   Principal Problem:   Acute pulmonary edema (HCC) Active Problems:   DOE (dyspnea on exertion)   AL amyloidosis   CKD (chronic kidney disease) stage 4, GFR 15-29 ml/min (HCC)   Acute on chronic diastolic CHF (congestive heart failure) (HCC)   Fluid overload, unspecified   Acute pulmonary edema -Patient presented with SOB, DOE, orthopnea and massive lower extremity edema edema. -CXR showed bilateral effusion and interstitial edema, BNP is slightly elevated at 130. -This is secondary to stage IV-V chronic kidney disease, seems to be worsening with less urinary output. -Started on IV diuresis, made about about 2 liters of urine since yesterday.  Stage IV-V CKD -Patient has stage V CKD secondary nephrotic range disease from to biopsy-proven AL amyloidosis. -Kidney biopsy done on 09/08/2014 showed AL amyloid. Patient was on 40 mg of Lasix twice a day. -Recently appears to be worsening, right forearm fistula created on 12/27/2015. -Lasix increased to 80 mg twice a day, will follow intake/output and daily weights.  Acute on chronic diastolic CHF -On last echo done in September 2017  LVEF is 60-65% and grade 2 diastolic dysfunction. -Acute pulmonary edema patient having is likely secondary to fluid overload from worsening of his kidney disease. -This is now manifests as acute on chronic diastolic CHF, this will improve as the fluid overload improves. -Continue home medication including Coreg and hydralazine.  AL amyloidosis -With renal involvement, patient was following with Dr. [name]. -Is on ixazomib citrate, was on dexamethasone as well for that.  DOE -The massive lower extremity edema, DOE normal from the fluid overload. -Started high-dose Lasix, nephrology to evaluate.   DVT prophylaxis: SQ insulin Code Status: Full Code Family Communication:  Disposition Plan:  Diet: Diet renal/carb modified with fluid restriction Diet-HS Snack? Nothing; Room service appropriate? Yes; Fluid consistency: Thin; Fluid restriction: 1200 mL Fluid  Consultants:   Nephrology  Procedures:   None  Antimicrobials:   None  Objective: Vitals:   01/23/16 2241 01/23/16 2245 01/24/16 0108 01/24/16 0506  BP:  (!) 174/68 (!) 151/50 (!) 152/52  Pulse:  72 70 71  Resp:    18  Temp:  98.1 F (36.7 C) 98.4 F (36.9 C) 98.2 F (36.8 C)  TempSrc:  Oral Oral Oral  SpO2:  94% 95% 94%  Weight: 103 kg (227 lb) 103 kg (227 lb)  102.4 kg (225 lb 11.2 oz)  Height: 5\' 10"  (1.778 m) 5\' 10"  (1.778 m)      Intake/Output Summary (Last 24 hours) at 01/24/16 0953 Last data filed at 01/24/16 0907  Gross per 24 hour  Intake             1283 ml  Output  2050 ml  Net             -767 ml   Filed Weights   01/23/16 2241 01/23/16 2245 01/24/16 0506  Weight: 103 kg (227 lb) 103 kg (227 lb) 102.4 kg (225 lb 11.2 oz)    Examination: General exam: Appears calm and comfortable  Respiratory system: Clear to auscultation. Respiratory effort normal. Cardiovascular system: S1 & S2 heard, RRR. No JVD, murmurs, rubs, gallops or clicks. No pedal edema. Gastrointestinal system: Abdomen is  nondistended, soft and nontender. No organomegaly or masses felt. Normal bowel sounds heard. Central nervous system: Alert and oriented. No focal neurological deficits. Extremities: +3 bilateral pedal edema Skin: No rashes, lesions or ulcers Psychiatry: Judgement and insight appear normal. Mood & affect appropriate.   Data Reviewed: I have personally reviewed following labs and imaging studies  CBC:  Recent Labs Lab 01/19/16 0931 01/23/16 1323 01/24/16 0423  WBC  --  6.8 8.0  NEUTROABS  --  4.9  --   HGB 8.6* 8.8* 8.2*  HCT  --  27.3* 26.0*  MCV  --  89.2 90.3  PLT  --  110* 852*   Basic Metabolic Panel:  Recent Labs Lab 01/23/16 1323 01/24/16 0423  NA 140 142  K 4.2 4.1  CL 112* 113*  CO2 16* 18*  GLUCOSE 123* 106*  BUN 89* 90*  CREATININE 5.87* 5.95*  CALCIUM 8.6* 8.5*   GFR: Estimated Creatinine Clearance: 15.2 mL/min (by C-G formula based on SCr of 5.95 mg/dL (H)). Liver Function Tests:  Recent Labs Lab 01/23/16 1323  AST 22  ALT 19  ALKPHOS 61  BILITOT 0.4  PROT 6.2*  ALBUMIN 3.9   No results for input(s): LIPASE, AMYLASE in the last 168 hours. No results for input(s): AMMONIA in the last 168 hours. Coagulation Profile:  Recent Labs Lab 01/23/16 2305  INR 1.27   Cardiac Enzymes: No results for input(s): CKTOTAL, CKMB, CKMBINDEX, TROPONINI in the last 168 hours. BNP (last 3 results) No results for input(s): PROBNP in the last 8760 hours. HbA1C: No results for input(s): HGBA1C in the last 72 hours. CBG: No results for input(s): GLUCAP in the last 168 hours. Lipid Profile: No results for input(s): CHOL, HDL, LDLCALC, TRIG, CHOLHDL, LDLDIRECT in the last 72 hours. Thyroid Function Tests:  Recent Labs  01/23/16 2305  TSH 1.035   Anemia Panel: No results for input(s): VITAMINB12, FOLATE, FERRITIN, TIBC, IRON, RETICCTPCT in the last 72 hours. Urine analysis: No results found for: COLORURINE, APPEARANCEUR, LABSPEC, PHURINE, GLUCOSEU, HGBUR,  BILIRUBINUR, KETONESUR, PROTEINUR, UROBILINOGEN, NITRITE, LEUKOCYTESUR Sepsis Labs: @LABRCNTIP (procalcitonin:4,lacticidven:4)  )No results found for this or any previous visit (from the past 240 hour(s)).   Invalid input(s): PROCALCITONIN, LACTICACIDVEN   Radiology Studies: Dg Chest 2 View  Result Date: 01/23/2016 CLINICAL DATA:  Increased shortness of breath over the past month. History of hypertension, CHF. EXAM: CHEST  2 VIEW COMPARISON:  PA and lateral chest x-ray of August 14, 2015 FINDINGS: The lungs are well-expanded. There small bilateral pleural effusions which are new. The interstitial markings are increased and more conspicuous than on the previous study. The heart is top-normal in size. The pulmonary vascularity is prominent centrally but stable. There is calcification in the wall of the aortic arch. The bony thorax exhibits no acute abnormality. IMPRESSION: New bilateral pleural effusions and mild interstitial edema consistent with CHF. This is superimposed upon chronic bronchitic change. No alveolar pneumonia. Thoracic aortic atherosclerosis. Electronically Signed   By: David  Martinique M.D.  On: 01/23/2016 14:00        Scheduled Meds: . amLODipine  10 mg Oral Daily  . aspirin EC  81 mg Oral Daily  . carvedilol  25 mg Oral BID WC  . cholecalciferol  3,000 Units Oral Daily  . fluticasone  1 spray Each Nare Daily  . furosemide  80 mg Intravenous BID  . gabapentin  500 mg Oral QHS  . heparin  5,000 Units Subcutaneous Q8H  . hydrALAZINE  100 mg Oral TID  . [START ON 01/26/2016] ixazomib citrate  3 mg Oral Q14 Days  . loratadine  10 mg Oral Daily  . sodium chloride flush  3 mL Intravenous Q12H  . vitamin B-12  50 mcg Oral Daily   Continuous Infusions:   LOS: 1 day    Time spent: 35 minutes    Navy Rothschild A, MD [facility] Pager [phone]  If 7PM-7AM, please contact night-coverage [email or web address] Password [email or web address] 01/24/2016, 9:53 AM

## 2016-01-24 NOTE — Consult Note (Signed)
Reason for Consult:Acute on Chronic Kidney Injury  Referring Physician: [facility]  Jose Garrett is an 65 y.o. male pmhx significant for AL amyloidosis, chronic diastolic heart failure, CKD stage IV, hypertension presenting with worsening shortness of breath and lower extremity edema. Chest x-ray in the ED was consistent with CHF with findings of bilateral pleural effusion and mild interstitial edema, BNP was elevated at 130.  Per patient had been been feeling short of breath for the past month. However noticed worsening swelling in his legs and scrotum over the last 10 days, discussed with his physician and increased Lasix from 40 PO  to 80 PO twice daily. Patient's swelling continued to increase, with worsening shortness of breath and therefore patient decided to come to the hospital for evaluation.  Patient is followed by Dr. [name] and had a right forearm fistula placed on 12/27/2015. Baseline SCr over this past year looks to be 3.0, however patient SCr seems to steadily be trending up over the last 3-4 months.   Past Medical History:  Diagnosis Date  . Amyloidosis ([place])   . Anemia Dx 2016  . Chronic diastolic CHF (congestive heart failure), NYHA class 1 ([place])    /hospital problem list 09/08/2014  . Chronic renal disease, stage III    /hospital problem list 09/08/2014  . Diarrhea   . Dyspnea    with exertion  . Enlarged heart 2015  . Family history of adverse reaction to anesthesia    "my daughter can't take certain anesthesia agents" (09/08/2014)  . GERD (gastroesophageal reflux disease)   . History of blood transfusion 09/08/2014   "got hematoma after renal biopsy & HgB dropped"  . Hyperlipidemia Dx 2012  . Hypertension Dx 2012  . Renal disorder    "there is a spot on my kidney" (09/08/2014)  . Restless legs     Past Surgical History:  Procedure Laterality Date  . AV FISTULA PLACEMENT Right 12/27/2015   Procedure: Right Arm ARTERIOVENOUS (AV) FISTULA CREATION;  Surgeon:  Angelia Mould, MD;  Location: [place];  Service: Vascular;  Laterality: Right;  . COLONOSCOPY    . LAPAROSCOPIC CHOLECYSTECTOMY  03/2011  . RENAL BIOPSY, PERCUTANEOUS Right 09/08/2014  . TONSILLECTOMY  ~ 1960    Family History  Problem Relation Age of Onset  . Hypertension Mother   . Diabetes Mother   . Heart disease Mother   . Skin cancer Mother   . Hypertension Father   . Diabetes Father   . Diabetes Sister     Social History:  reports that he quit smoking about 4 years ago. His smoking use included Cigarettes. He has a 80.00 pack-year smoking history. He has never used smokeless tobacco. He reports that he does not drink alcohol or use drugs.  Allergies:  Allergies  Allergen Reactions  . No Known Allergies     Medications: I have reviewed the patient's current medications.  BMP Latest Ref Rng & Units 01/24/2016 01/23/2016 12/30/2015  Glucose 65 - 99 mg/dL 106(H) 123(H) -  BUN 6 - 20 mg/dL 90(H) 89(H) -  Creatinine 0.61 - 1.24 mg/dL 5.95(H) 5.87(H) -  Sodium 135 - 145 mmol/L 142 140 -  Potassium 3.5 - 5.1 mmol/L 4.1 4.2 -  Chloride 101 - 111 mmol/L 113(H) 112(H) -  CO2 22 - 32 mmol/L 18(L) 16(L) -  Calcium 8.9 - 10.3 mg/dL 8.5(L) 8.6(L) 8.1(L)   CBC Latest Ref Rng & Units 01/24/2016 01/23/2016 01/19/2016  WBC 4.0 - 10.5 K/uL 8.0 6.8 -  Hemoglobin 13.0 - 17.0 g/dL 8.2(L) 8.8(L) 8.6(L)  Hematocrit 39.0 - 52.0 % 26.0(L) 27.3(L) -  Platelets 150 - 400 K/uL 115(L) 110(L) -    Dg Chest 2 View  Result Date: 01/23/2016 CLINICAL DATA:  Increased shortness of breath over the past month. History of hypertension, CHF. EXAM: CHEST  2 VIEW COMPARISON:  PA and lateral chest x-ray of August 14, 2015 FINDINGS: The lungs are well-expanded. There small bilateral pleural effusions which are new. The interstitial markings are increased and more conspicuous than on the previous study. The heart is top-normal in size. The pulmonary vascularity is prominent centrally but stable. There is  calcification in the wall of the aortic arch. The bony thorax exhibits no acute abnormality. IMPRESSION: New bilateral pleural effusions and mild interstitial edema consistent with CHF. This is superimposed upon chronic bronchitic change. No alveolar pneumonia. Thoracic aortic atherosclerosis. Electronically Signed   By: David  Martinique M.D.   On: 01/23/2016 14:00    Review of Systems  Constitutional: Positive for malaise/fatigue. Negative for weight loss.  Respiratory: Positive for shortness of breath.   Cardiovascular: Positive for orthopnea and leg swelling. Negative for chest pain.   Blood pressure (!) 144/51, pulse 70, temperature 98.6 F (37 C), temperature source Oral, resp. rate 18, height 5\' 10"  (1.778 m), weight 225 lb 11.2 oz (102.4 kg), SpO2 96 %. Physical Exam  Constitutional: He is oriented to person, place, and time. He appears well-developed and well-nourished.  HENT:  Head: Normocephalic and atraumatic.  Eyes: Conjunctivae are normal.  Neck: Normal range of motion. Neck supple.  Cardiovascular: Normal rate and regular rhythm.   3/5 systolic murmur   Respiratory: Effort normal.  Notable for intermittent wheezing, and  diminished airway sounds on right basilar lung  GI: Soft. Bowel sounds are normal.  Musculoskeletal:  Significant 3+ pitting edema from ankle to thigh, with 3+ scrotal edema noted on exam as well   Neurological: He is alert and oriented to person, place, and time.  Skin: Skin is warm and dry.  Right forearm fistula with palpable thrill and bruit with auscultation    Assessment/Plan:  Background: 65 year old gentleman with history of amyloidosis, hypertension, CKD stage 5, chronic diastolic heart failure presenting with acute volume overload. Patient with rising serum creatinine over the last 24 hours as well as baseline serum creatinine has been generally rising over the past several months. Patient has received 80 IV lasix diuresis, titration up on 11/14 to  BID.   1. AoCKD (CKD stage 5): Nephrologist Dr. [name]. Patient presenting with likely cardiorenal syndrome in the setting of amyloidosis and HTN. Baseline SCr 3.0, however rising over the past several months consistent with progressive kidney dysfunction.  Last renal ultrasound in 2016 showed kidneys showed 12.9 cm/11.6 cm respectively right and left. Patient with a fistula placed on 12/27/2015, and will needed maturation before future use. Patient SCr is trending up despite mild diuresis with 80 mg IV lasix yesterday.  - SCr 5.87> 5.95, continue to trend, weight trending down slightly, adequate UOP 2.05 L, Lasix currently 80 IV BID. Will consider adding metolazone if diuresis is not adequate tomorrow.   - Renal function plan - May need dialysis in near the future, if patient does not adequately respond to diuresis and remains volume overloaded.   2. Anemia: Hgb 8.2. Currently on Procrit 20,000 units every two weeks. Last obtained on 10/20 Iron 65 and saturation of 24.  3.Metabolic Bone disease: PTH elevated at 213 10/20. Currently on cholecalciferol 3,000  units.Will check phosphorus level.  4. Acute on chronic diastolic heart failure - September 2017 LVEF is 65-70% and grade 2 diastolic dysfunction, severe concentric hypertrophy, PA pressure normal. -  Currently on Coreg, hydralazine, and lasix IV 80 mg BID  5. AL Amyloidosis - on ixazomib citrate  6.HTN: Hypertensive today; currently on coreg 25 BID, norvasc 10 mg, and hydralazine 100 mg TID - volume overload likely contributing    Shakedra Beam Z Rexton Greulich 01/24/2016, 1:42 PM

## 2016-01-25 DIAGNOSIS — E8770 Fluid overload, unspecified: Secondary | ICD-10-CM

## 2016-01-25 DIAGNOSIS — N189 Chronic kidney disease, unspecified: Secondary | ICD-10-CM

## 2016-01-25 DIAGNOSIS — N179 Acute kidney failure, unspecified: Secondary | ICD-10-CM

## 2016-01-25 LAB — CBC
HEMATOCRIT: 25.7 % — AB (ref 39.0–52.0)
HEMOGLOBIN: 8.1 g/dL — AB (ref 13.0–17.0)
MCH: 28.8 pg (ref 26.0–34.0)
MCHC: 31.5 g/dL (ref 30.0–36.0)
MCV: 91.5 fL (ref 78.0–100.0)
Platelets: 103 10*3/uL — ABNORMAL LOW (ref 150–400)
RBC: 2.81 MIL/uL — ABNORMAL LOW (ref 4.22–5.81)
RDW: 18.4 % — AB (ref 11.5–15.5)
WBC: 5.8 10*3/uL (ref 4.0–10.5)

## 2016-01-25 LAB — BASIC METABOLIC PANEL
ANION GAP: 12 (ref 5–15)
BUN: 91 mg/dL — ABNORMAL HIGH (ref 6–20)
CALCIUM: 8.6 mg/dL — AB (ref 8.9–10.3)
CO2: 18 mmol/L — ABNORMAL LOW (ref 22–32)
Chloride: 111 mmol/L (ref 101–111)
Creatinine, Ser: 6.14 mg/dL — ABNORMAL HIGH (ref 0.61–1.24)
GFR calc Af Amer: 10 mL/min — ABNORMAL LOW (ref >60)
GFR, EST NON AFRICAN AMERICAN: 9 mL/min — AB (ref >60)
GLUCOSE: 98 mg/dL (ref 65–99)
POTASSIUM: 4.3 mmol/L (ref 3.5–5.1)
SODIUM: 141 mmol/L (ref 135–145)

## 2016-01-25 LAB — HEMOGLOBIN A1C
HEMOGLOBIN A1C: 5.7 % — AB (ref 4.8–5.6)
MEAN PLASMA GLUCOSE: 117 mg/dL

## 2016-01-25 LAB — PHOSPHORUS: PHOSPHORUS: 7.6 mg/dL — AB (ref 2.5–4.6)

## 2016-01-25 MED ORDER — FUROSEMIDE 10 MG/ML IJ SOLN
80.0000 mg | Freq: Three times a day (TID) | INTRAMUSCULAR | Status: DC
  Administered 2016-01-25 – 2016-01-28 (×8): 80 mg via INTRAVENOUS
  Filled 2016-01-25 (×9): qty 8

## 2016-01-25 MED ORDER — GABAPENTIN 250 MG/5ML PO SOLN
300.0000 mg | Freq: Every day | ORAL | Status: DC
Start: 2016-01-25 — End: 2016-02-04
  Administered 2016-01-25 – 2016-02-03 (×10): 300 mg via ORAL
  Filled 2016-01-25 (×12): qty 6

## 2016-01-25 MED ORDER — CALCIUM ACETATE (PHOS BINDER) 667 MG PO CAPS
667.0000 mg | ORAL_CAPSULE | Freq: Three times a day (TID) | ORAL | Status: DC
  Administered 2016-01-25 – 2016-01-31 (×16): 667 mg via ORAL
  Filled 2016-01-25 (×16): qty 1

## 2016-01-25 MED ORDER — METOLAZONE 5 MG PO TABS
5.0000 mg | ORAL_TABLET | Freq: Two times a day (BID) | ORAL | Status: DC
  Administered 2016-01-25 – 2016-01-28 (×6): 5 mg via ORAL
  Filled 2016-01-25 (×6): qty 1

## 2016-01-25 NOTE — Progress Notes (Signed)
[facility] Progress Note     Subjective:   Patient states he feels about the same. States still with signifcant dyspnea when getting up to go to the bathroom.    Objective:   BP (!) 136/56 (BP Location: Left Arm)   Pulse 67   Temp 98.5 F (36.9 C) (Oral)   Resp 18   Ht 5\' 10"  (1.778 m)   Wt 226 lb 8 oz (102.7 kg) Comment: scale a  SpO2 93%   BMI 32.50 kg/m   Intake/Output Summary (Last 24 hours) at 01/25/16 0813 Last data filed at 01/25/16 0630  Gross per 24 hour  Intake             1320 ml  Output             1160 ml  Net              160 ml   Weight change: 16 lb 8 oz (7.484 kg)  Physical Exam: Gen: Lying in bed, NAD distress.  CVS: Holosystolic murmur noted, RRR otherwise, + JVP  Resp: CTAB, no increased WOB  Abd: Distended, no ttp  Ext: 3+ edema through extremities   Imaging: Dg Chest 2 View  Result Date: 01/23/2016 CLINICAL DATA:  Increased shortness of breath over the past month. History of hypertension, CHF. EXAM: CHEST  2 VIEW COMPARISON:  PA and lateral chest x-ray of August 14, 2015 FINDINGS: The lungs are well-expanded. There small bilateral pleural effusions which are new. The interstitial markings are increased and more conspicuous than on the previous study. The heart is top-normal in size. The pulmonary vascularity is prominent centrally but stable. There is calcification in the wall of the aortic arch. The bony thorax exhibits no acute abnormality. IMPRESSION: New bilateral pleural effusions and mild interstitial edema consistent with CHF. This is superimposed upon chronic bronchitic change. No alveolar pneumonia. Thoracic aortic atherosclerosis. Electronically Signed   By: David  Martinique M.D.   On: 01/23/2016 14:00    Labs: BMET  Recent Labs Lab 01/23/16 1323 01/24/16 0423 01/25/16 0436  NA 140 142 141  K 4.2 4.1 4.3  CL 112* 113* 111  CO2 16* 18* 18*  GLUCOSE 123* 106* 98  BUN 89* 90* 91*  CREATININE 5.87* 5.95* 6.14*  CALCIUM  8.6* 8.5* 8.6*   CBC  Recent Labs Lab 01/19/16 0931 01/23/16 1323 01/24/16 0423 01/25/16 0436  WBC  --  6.8 8.0 5.8  NEUTROABS  --  4.9  --   --   HGB 8.6* 8.8* 8.2* 8.1*  HCT  --  27.3* 26.0* 25.7*  MCV  --  89.2 90.3 91.5  PLT  --  110* 115* 103*    Medications:    . amLODipine  10 mg Oral Daily  . aspirin EC  81 mg Oral Daily  . carvedilol  25 mg Oral BID WC  . cholecalciferol  3,000 Units Oral Daily  . fluticasone  1 spray Each Nare Daily  . furosemide  80 mg Intravenous BID  . gabapentin  500 mg Oral QHS  . heparin  5,000 Units Subcutaneous Q8H  . hydrALAZINE  100 mg Oral TID  . [START ON 01/26/2016] ixazomib citrate  3 mg Oral Q14 Days  . loratadine  10 mg Oral Daily  . sodium chloride flush  3 mL Intravenous Q12H  . vitamin B-12  50 mcg Oral Daily   Assessment/ Plan:   Background: 65 year old gentleman with history of amyloidosis, hypertension, CKD stage 5,  chronic diastolic heart failure presenting with acute volume overload. Patient with rising serum creatinine over the last 24 hours as well as baseline serum creatinine has been generally rising over the past several months. Patient has received 80 IV lasix diuresis on 11/13, titration up on 11/14 to BID.   1. AoCKD (CKD stage 5): Nephrologist Dr. [name]. Patient presenting with likely cardiorenal syndrome in the setting of amyloidosis and HTN. Baseline SCr 3.0, however rising over the past several months consistent with progressive kidney dysfunction.  Last renal ultrasound in 2016 showed kidneys showed 12.9 cm/11.6 cm respectively right and left. Patient with a fistula placed on 12/27/2015, and will needed maturation before future use.  - SCr 5.87> 5.95> 6.14  Continues to trend up, weight slightly up today 225 lbs> 226 lbs, UOP 1.16 L, decreased from yesterday- Increase IV lasix from BID to TID, add 5 mg of metolazone BID   - Renal function plan daily  - May need dialysis in near the future, remain fluid  overloaded and patient with net positive over the last 24 hours. 2. Anemia: Hgb 8.2. Currently on Procrit 20,000 units every two weeks. Last obtained on 10/20 Iron 65 and saturation of 24.  3.Metabolic Bone disease: PTH elevated at 213 10/20. Currently on cholecalciferol 3,000 units. Phosphorus level 7.6. Calcium acetate 667 with meals  4. Acute on chronic diastolic heart failure - September 2017 LVEF is 65-70% and grade 2 diastolic dysfunction, severe concentric hypertrophy, PA pressure normal. -  Currently on Coreg, hydralazine, and lasix IV 80 mg BID  5. AL Amyloidosis - on ixazomib citrate  6.HTN: Normotensive; currently on coreg 25 BID, norvasc 10 mg, and hydralazine 100 mg TID,      Kerrin Mo, MD 01/25/2016, 8:13 AM

## 2016-01-25 NOTE — Progress Notes (Signed)
PROGRESS NOTE    Jose Garrett  NAT:[id number] DOB: 09/27/1950 DOA: 01/23/2016 PCP: [name], MD   Brief Narrative:  Jose Garrett a 65 y.o.malewith medical history significant of AL Amyloidosis, CKD stage 4-5. Patient follows with Dr. [name] for his CKD, and a right forearm fistula created on 12/27/2015 for anticipation of dialysis. Patient reported for the several weeks lower extremity edema was worsening but for the past 10 days got really worse, he has scrotal edema, has DOE and PND and orthopnea. His Lasix was increased recently from 40 to 80 mg by Dr. [name] but he continues to have worsening of his fluid status so he came into the ED for further management.    Assessment & Plan:   Principal Problem:   Acute pulmonary edema (HCC) Active Problems:   DOE (dyspnea on exertion)   AL amyloidosis   CKD (chronic kidney disease) stage 4, GFR 15-29 ml/min (HCC)   Acute on chronic diastolic CHF (congestive heart failure) (HCC)   Fluid overload, unspecified  Acute pulmonary edema -Patient presented with SOB, DOE, orthopnea and massive lower extremity edema edema. -CXR showed bilateral effusion and interstitial edema, BNP is slightly elevated at 130. -This is secondary to stage IV-Vchronic kidney disease, seems to be worsening with less urinary output. -Started on IV diuresis, made about about 1.160 liters of urine since yesterday. - Patient with worsening renal function. May consider increasing IV Lasix and if no improvement in renal function ?? Is patient heading towards hemodialysis. Will defer diuresis to nephrology.  Stage IV-V CKD -Patient has stage V CKD secondary nephrotic range disease from to biopsy-proven AL amyloidosis. -Kidney biopsy done on 09/08/2014 showed AL amyloid. Patient was on 40 mg of Lasix twice a day. -Recently appears to be worsening, right forearm fistula created on 12/27/2015. -Lasix increased to 80 mg twice a day, will follow  intake/output and daily weights. - Patient with worsening renal function despite IV Lasix.??? Progressive kidney disease. Nephrology following and appreciate input and recommendations.  Acute on chronic diastolic CHF -On last echo done in September 2017 LVEF is 60-65% and grade 2 diastolic dysfunction. -Acute pulmonary edema likely secondary to fluid overload from worsening of his kidney disease. -This is now manifests as acute on chronic diastolic CHF, this will improve as the fluid overload improves. -Continue home medication including Coreg and hydralazine. - Patient on IV Lasix 80 mg every 12 hours. Patient also metolazone. Patient with a urine output of 1.160 L over the past 24 hours and is -607 mL during this hospitalization. Patient with worsening renal function. Will defer increasing Lasix dose to nephrology. If no significant improvement and continued worsening of renal function despite diuresis patient may be heading towards hemodialysis. Per nephrology.  AL amyloidosis -With renal involvement, patient was following with Dr. [name]. -Is on ixazomib citrate, was on dexamethasone as well for that.  DOE -The massive lower extremity edema, DOE normal from the fluid overload. - Slowly clinically improving however still with significant volume overload. Patient on metolazone and high-dose IV Lasix. Renal function worsening. Renal ff.     DVT prophylaxis: Heparin Code Status: Full Family Communication: Updated patient. No family at bedside. Disposition Plan: Home once volume overload has resolved and per nephrology.   Consultants:   Nephrology: 01/24/2016  Procedures:   Chest x-ray 01/23/2016    Antimicrobials:   None   Subjective: Patient states some improvement with shortness of breath. Patient states urine output is decreasing. No chest pain.  Objective:  Vitals:   01/24/16 2041 01/25/16 0022 01/25/16 0449 01/25/16 1215  BP: (!) 131/39 (!) 151/48 (!) 136/56 (!)  120/50  Pulse: 68 73 67 68  Resp: 18 18 18 20   Temp: 98.3 F (36.8 C) 98.8 F (37.1 C) 98.5 F (36.9 C) 97.7 F (36.5 C)  TempSrc: Oral Oral Oral Oral  SpO2: 95% 93% 93% 95%  Weight:   102.7 kg (226 lb 8 oz)   Height:        Intake/Output Summary (Last 24 hours) at 01/25/16 1219 Last data filed at 01/25/16 1015  Gross per 24 hour  Intake             1080 ml  Output             1160 ml  Net              -80 ml   Filed Weights   01/23/16 2245 01/24/16 0506 01/25/16 0449  Weight: 103 kg (227 lb) 102.4 kg (225 lb 11.2 oz) 102.7 kg (226 lb 8 oz)    Examination:  General exam: Appears calm and comfortable  Respiratory system: Decreased breath sounds in the bases. Some scattered crackles.  Cardiovascular system: S1 & S2 heard, RRR. + JVD, murmurs, rubs, gallops or clicks. 3+ bilateral lower extremity edema. Scrotal edema.  Gastrointestinal system: Abdomen is nondistended, soft and nontender. No organomegaly or masses felt. Normal bowel sounds heard. Central nervous system: Alert and oriented. No focal neurological deficits. Extremities: Symmetric 5 x 5 power. Skin: No rashes, lesions or ulcers Psychiatry: Judgement and insight appear normal. Mood & affect appropriate.     Data Reviewed: I have personally reviewed following labs and imaging studies  CBC:  Recent Labs Lab 01/19/16 0931 01/23/16 1323 01/24/16 0423 01/25/16 0436  WBC  --  6.8 8.0 5.8  NEUTROABS  --  4.9  --   --   HGB 8.6* 8.8* 8.2* 8.1*  HCT  --  27.3* 26.0* 25.7*  MCV  --  89.2 90.3 91.5  PLT  --  110* 115* 062*   Basic Metabolic Panel:  Recent Labs Lab 01/23/16 1323 01/24/16 0423 01/25/16 0436  NA 140 142 141  K 4.2 4.1 4.3  CL 112* 113* 111  CO2 16* 18* 18*  GLUCOSE 123* 106* 98  BUN 89* 90* 91*  CREATININE 5.87* 5.95* 6.14*  CALCIUM 8.6* 8.5* 8.6*  PHOS  --   --  7.6*   GFR: Estimated Creatinine Clearance: 14.8 mL/min (by C-G formula based on SCr of 6.14 mg/dL (H)). Liver Function  Tests:  Recent Labs Lab 01/23/16 1323  AST 22  ALT 19  ALKPHOS 61  BILITOT 0.4  PROT 6.2*  ALBUMIN 3.9   No results for input(s): LIPASE, AMYLASE in the last 168 hours. No results for input(s): AMMONIA in the last 168 hours. Coagulation Profile:  Recent Labs Lab 01/23/16 2305  INR 1.27   Cardiac Enzymes: No results for input(s): CKTOTAL, CKMB, CKMBINDEX, TROPONINI in the last 168 hours. BNP (last 3 results) No results for input(s): PROBNP in the last 8760 hours. HbA1C:  Recent Labs  01/23/16 2305  HGBA1C 5.7*   CBG: No results for input(s): GLUCAP in the last 168 hours. Lipid Profile: No results for input(s): CHOL, HDL, LDLCALC, TRIG, CHOLHDL, LDLDIRECT in the last 72 hours. Thyroid Function Tests:  Recent Labs  01/23/16 2305  TSH 1.035   Anemia Panel: No results for input(s): VITAMINB12, FOLATE, FERRITIN, TIBC, IRON, RETICCTPCT in the last  72 hours. Sepsis Labs: No results for input(s): PROCALCITON, LATICACIDVEN in the last 168 hours.  No results found for this or any previous visit (from the past 240 hour(s)).       Radiology Studies: Dg Chest 2 View  Result Date: 01/23/2016 CLINICAL DATA:  Increased shortness of breath over the past month. History of hypertension, CHF. EXAM: CHEST  2 VIEW COMPARISON:  PA and lateral chest x-ray of August 14, 2015 FINDINGS: The lungs are well-expanded. There small bilateral pleural effusions which are new. The interstitial markings are increased and more conspicuous than on the previous study. The heart is top-normal in size. The pulmonary vascularity is prominent centrally but stable. There is calcification in the wall of the aortic arch. The bony thorax exhibits no acute abnormality. IMPRESSION: New bilateral pleural effusions and mild interstitial edema consistent with CHF. This is superimposed upon chronic bronchitic change. No alveolar pneumonia. Thoracic aortic atherosclerosis. Electronically Signed   By: David  Martinique  M.D.   On: 01/23/2016 14:00        Scheduled Meds: . amLODipine  10 mg Oral Daily  . aspirin EC  81 mg Oral Daily  . calcium acetate  667 mg Oral TID WC  . carvedilol  25 mg Oral BID WC  . cholecalciferol  3,000 Units Oral Daily  . fluticasone  1 spray Each Nare Daily  . furosemide  80 mg Intravenous Q8H  . gabapentin  500 mg Oral QHS  . heparin  5,000 Units Subcutaneous Q8H  . hydrALAZINE  100 mg Oral TID  . [START ON 01/26/2016] ixazomib citrate  3 mg Oral Q14 Days  . loratadine  10 mg Oral Daily  . metolazone  5 mg Oral BID  . sodium chloride flush  3 mL Intravenous Q12H  . vitamin B-12  50 mcg Oral Daily   Continuous Infusions:   LOS: 2 days    Time spent: 30 minutes    THOMPSON,DANIEL, MD [facility] Pager [phone]  If 7PM-7AM, please contact night-coverage [email or web address] Password [email or web address] 01/25/2016, 12:19 PM

## 2016-01-26 LAB — RENAL FUNCTION PANEL
ALBUMIN: 3.5 g/dL (ref 3.5–5.0)
ANION GAP: 10 (ref 5–15)
BUN: 93 mg/dL — AB (ref 6–20)
CALCIUM: 8.5 mg/dL — AB (ref 8.9–10.3)
CO2: 18 mmol/L — ABNORMAL LOW (ref 22–32)
Chloride: 112 mmol/L — ABNORMAL HIGH (ref 101–111)
Creatinine, Ser: 6.02 mg/dL — ABNORMAL HIGH (ref 0.61–1.24)
GFR calc Af Amer: 10 mL/min — ABNORMAL LOW (ref >60)
GFR, EST NON AFRICAN AMERICAN: 9 mL/min — AB (ref >60)
GLUCOSE: 101 mg/dL — AB (ref 65–99)
PHOSPHORUS: 7.6 mg/dL — AB (ref 2.5–4.6)
POTASSIUM: 3.7 mmol/L (ref 3.5–5.1)
SODIUM: 140 mmol/L (ref 135–145)

## 2016-01-26 LAB — CBC
HEMATOCRIT: 24.6 % — AB (ref 39.0–52.0)
HEMOGLOBIN: 7.8 g/dL — AB (ref 13.0–17.0)
MCH: 28.6 pg (ref 26.0–34.0)
MCHC: 31.7 g/dL (ref 30.0–36.0)
MCV: 90.1 fL (ref 78.0–100.0)
Platelets: 91 10*3/uL — ABNORMAL LOW (ref 150–400)
RBC: 2.73 MIL/uL — ABNORMAL LOW (ref 4.22–5.81)
RDW: 18.7 % — ABNORMAL HIGH (ref 11.5–15.5)
WBC: 5.2 10*3/uL (ref 4.0–10.5)

## 2016-01-26 MED ORDER — IXAZOMIB CITRATE 3 MG PO CAPS
3.0000 mg | ORAL_CAPSULE | ORAL | Status: DC
  Administered 2016-01-26: 3 mg via ORAL

## 2016-01-26 MED ORDER — SODIUM BICARBONATE 650 MG PO TABS
650.0000 mg | ORAL_TABLET | Freq: Two times a day (BID) | ORAL | Status: DC
  Administered 2016-01-26 – 2016-01-29 (×8): 650 mg via ORAL
  Filled 2016-01-26 (×8): qty 1

## 2016-01-26 MED ORDER — DARBEPOETIN ALFA 100 MCG/0.5ML IJ SOSY
100.0000 ug | PREFILLED_SYRINGE | INTRAMUSCULAR | Status: DC
  Administered 2016-01-26: 100 ug via SUBCUTANEOUS
  Filled 2016-01-26 (×2): qty 0.5

## 2016-01-26 MED ORDER — ACYCLOVIR 200 MG PO CAPS
400.0000 mg | ORAL_CAPSULE | Freq: Every day | ORAL | Status: DC
  Administered 2016-01-26 – 2016-02-04 (×10): 400 mg via ORAL
  Filled 2016-01-26 (×10): qty 2

## 2016-01-26 NOTE — Progress Notes (Addendum)
[facility] Progress Note     Subjective:   Feeling better, standing up eating breakfast.  Noted increased UOP after getting metolazone last night.  Cr. Stable--> but wt up?   Objective:   BP (!) 136/50 (BP Location: Left Arm)   Pulse 71   Temp 97.5 F (36.4 C) (Oral)   Resp 18   Ht 5\' 10"  (1.778 m)   Wt 103.4 kg (227 lb 14.4 oz) Comment: Scale A  SpO2 94%   BMI 32.70 kg/m   Intake/Output Summary (Last 24 hours) at 01/26/16 1140 Last data filed at 01/26/16 0933  Gross per 24 hour  Intake              483 ml  Output             2000 ml  Net            -1517 ml   Weight change: 0.635 kg (1 lb 6.4 oz)  Physical Exam: Gen: standing up eating breakfast HEENT: EOMI, PERRL, MMM NECK: no JVD at 90 degrees CVS: Holosystolic murmur noted, RRR otherwise  Resp: CTAB, no increased WOB  Abd: Distended, no ttp  Ext: 3+ LE edema, however improved.   Imaging: No results found.  Labs: BMET  Recent Labs Lab 01/23/16 1323 01/24/16 0423 01/25/16 0436 01/26/16 0335  NA 140 142 141 140  K 4.2 4.1 4.3 3.7  CL 112* 113* 111 112*  CO2 16* 18* 18* 18*  GLUCOSE 123* 106* 98 101*  BUN 89* 90* 91* 93*  CREATININE 5.87* 5.95* 6.14* 6.02*  CALCIUM 8.6* 8.5* 8.6* 8.5*  PHOS  --   --  7.6* 7.6*   CBC  Recent Labs Lab 01/23/16 1323 01/24/16 0423 01/25/16 0436 01/26/16 0335  WBC 6.8 8.0 5.8 5.2  NEUTROABS 4.9  --   --   --   HGB 8.8* 8.2* 8.1* 7.8*  HCT 27.3* 26.0* 25.7* 24.6*  MCV 89.2 90.3 91.5 90.1  PLT 110* 115* 103* 91*    Medications:    . amLODipine  10 mg Oral Daily  . aspirin EC  81 mg Oral Daily  . calcium acetate  667 mg Oral TID WC  . carvedilol  25 mg Oral BID WC  . cholecalciferol  3,000 Units Oral Daily  . fluticasone  1 spray Each Nare Daily  . furosemide  80 mg Intravenous Q8H  . gabapentin  300 mg Oral QHS  . heparin  5,000 Units Subcutaneous Q8H  . hydrALAZINE  100 mg Oral TID  . ixazomib citrate  3 mg Oral Q14 Days  .  loratadine  10 mg Oral Daily  . metolazone  5 mg Oral BID  . sodium bicarbonate  650 mg Oral BID  . sodium chloride flush  3 mL Intravenous Q12H  . vitamin B-12  50 mcg Oral Daily   Assessment/ Plan:   Background: 65 year old gentleman with history of amyloidosis, hypertension, CKD stage 5, chronic diastolic heart failure presenting with acute volume overload. Patient with rising serum creatinine over the last 24 hours as well as baseline serum creatinine has been generally rising over the past several months.   1. AoCKD (CKD stage 5): Nephrologist Dr. [name]. Patient presenting with likely cardiorenal syndrome in the setting of amyloidosis and HTN. Baseline SCr 3.0, however rising over the past several months consistent with progressive kidney dysfunction.  Last renal ultrasound in 2016 showed kidneys showed 12.9 cm/11.6 cm respectively right and left. Patient with a fistula  placed on 12/27/2015, and will needed maturation before future use.  - Continue Lasix 80 IV TID and metolazone 5 mg BID--> better UOP after dose. - Renal function plan daily  - May need dialysis in near the future--> pt strongly desires to avoid dialysis through Thanksgiving and we will attempt to do so.  2. Anemia: Hgb 8.2. Currently on Procrit 20,000 units every two weeks. Last obtained on 10/20 Iron 65 and saturation of 24. Giving Aranesp 100 q week here. (start 11/16)  3.Metabolic Bone disease: PTH elevated at 213 10/20. Currently on cholecalciferol 3,000 units. Phosphorus level 7.6. Calcium acetate 667 with meals, renal diet  4. Acute on chronic diastolic heart failure - September 2017 LVEF is 65-70% and grade 2 diastolic dysfunction, severe concentric hypertrophy, PA pressure normal. -  Currently on Coreg, hydralazine,  5. AL Amyloidosis - on ixazomib citrate    Madelon Lips MD [facility] Cell [phone] pgr [phone] 01/26/2016, 11:40 AM

## 2016-01-26 NOTE — Progress Notes (Signed)
PROGRESS NOTE    Jose Garrett  CBJ:[id number] DOB: 03-18-1950 DOA: 01/23/2016 PCP: [name], MD   Brief Narrative:  Admir Candelas Pooleis a 65 y.o.malewith medical history significant of AL Amyloidosis, CKD stage 4-5. Patient follows with Dr. [name] for his CKD, and a right forearm fistula created on 12/27/2015 for anticipation of dialysis. Patient reported for the several weeks lower extremity edema was worsening but for the past 10 days got really worse, he has scrotal edema, has DOE and PND and orthopnea. His Lasix was increased recently from 40 to 80 mg by Dr. [name] but he continues to have worsening of his fluid status so he came into the ED for further management.    Assessment & Plan:   Principal Problem:   Acute pulmonary edema (HCC) Active Problems:   DOE (dyspnea on exertion)   AL amyloidosis   CKD (chronic kidney disease) stage 4, GFR 15-29 ml/Garrett (HCC)   Acute on chronic diastolic CHF (congestive heart failure) (HCC)   Fluid overload, unspecified   Acute renal failure superimposed on chronic kidney disease (HCC)   Hypervolemia  Acute pulmonary edema -Patient presented with SOB, DOE, orthopnea and massive lower extremity edema edema. -CXR showed bilateral effusion and interstitial edema, BNP is slightly elevated at 130. -This is secondary to stage IV-Vchronic kidney disease, seems to be worsening with less urinary output. -Started on IV diuresis, made about 1.250 liters of urine since yesterday. - Patient with renal function that seems to be plateauing with increased IV Lasix and addition of metolazone.?? Is patient heading towards hemodialysis. Will defer diuresis to nephrology.  Stage IV-V CKD -Patient has stage V CKD secondary nephrotic range disease from to biopsy-proven AL amyloidosis. -Kidney biopsy done on 09/08/2014 showed AL amyloid. Patient was on 40 mg of Lasix twice a day. -Recently appears to be worsening, right forearm fistula  created on 12/27/2015. -Lasix increased to 80 mg every 8 hours and metallic zone added to regimen. Patient with urine output of 1.25 L over the past 24 hours. Renal function seems to be plateauing. ??? Progressive kidney disease. Nephrology following and appreciate input and recommendations.  Acute on chronic diastolic CHF -On last echo done in September 2017 LVEF is 60-65% and grade 2 diastolic dysfunction. -Acute pulmonary edema likely secondary to fluid overload from worsening of his kidney disease. -This is now manifests as acute on chronic diastolic CHF, this will improve as the fluid overload improves. -Continue home medication including Coreg and hydralazine. - Patient on IV Lasix 80 mg every 8 hours. Patient also on metolazone. Patient with a urine output of 1.250 L over the past 24 hours and is -1614 mL during this hospitalization. Patient with renal function that seems to be plateauing. Will defer increasing Lasix dose to nephrology. If no significant improvement and continued worsening of renal function despite diuresis patient may be heading towards hemodialysis. Per nephrology.  AL amyloidosis -With renal involvement, patient was following with Dr. [name]. -Is on ixazomib citrate, was on dexamethasone as well for that. Resume acyclovir.  DOE -The massive lower extremity edema, DOE normal from the fluid overload. - Slowly clinically improving however still with significant volume overload. Patient on metolazone and high-dose IV Lasix. Renal function seems to be stabilizing and creatinine currently at 6.02 from 6.14 yesterday. Patient with a urine output of 1.250 L over the past 24 hours. Patient is -1.6 L during this hospitalization. Renal ff.     DVT prophylaxis: Heparin Code Status: Full Family Communication:  Updated patient and family at bedside. Disposition Plan: Home once volume overload has resolved and per nephrology.   Consultants:   Nephrology:  01/24/2016  Procedures:   Chest x-ray 01/23/2016    Antimicrobials:   None   Subjective: Patient states some improvement with shortness of breath. Patient states urine output picking back up with addition of metolazone. No chest pain.  Objective: Vitals:   01/25/16 0449 01/25/16 1215 01/25/16 2006 01/26/16 0500  BP: (!) 136/56 (!) 120/50 127/71 (!) 136/50  Pulse: 67 68 68 71  Resp: 18 20 20 18   Temp: 98.5 F (36.9 C) 97.7 F (36.5 C) 97.9 F (36.6 C) 97.5 F (36.4 C)  TempSrc: Oral Oral Oral Oral  SpO2: 93% 95% 95% 94%  Weight: 102.7 kg (226 lb 8 oz)   103.4 kg (227 lb 14.4 oz)  Height:        Intake/Output Summary (Last 24 hours) at 01/26/16 1231 Last data filed at 01/26/16 0933  Gross per 24 hour  Intake              483 ml  Output             2000 ml  Net            -1517 ml   Filed Weights   01/24/16 0506 01/25/16 0449 01/26/16 0500  Weight: 102.4 kg (225 lb 11.2 oz) 102.7 kg (226 lb 8 oz) 103.4 kg (227 lb 14.4 oz)    Examination:  General exam: Appears calm and comfortable  Respiratory system: Decreased breath sounds in the bases. Some scattered crackles.  Cardiovascular system: S1 & S2 heard, RRR. + JVD, murmurs, rubs, gallops or clicks. 2-3+ bilateral lower extremity edema. Scrotal edema.  Gastrointestinal system: Abdomen is nondistended, soft and nontender. No organomegaly or masses felt. Normal bowel sounds heard. Central nervous system: Alert and oriented. No focal neurological deficits. Extremities: Symmetric 5 x 5 power. Skin: No rashes, lesions or ulcers Psychiatry: Judgement and insight appear normal. Mood & affect appropriate.     Data Reviewed: I have personally reviewed following labs and imaging studies  CBC:  Recent Labs Lab 01/23/16 1323 01/24/16 0423 01/25/16 0436 01/26/16 0335  WBC 6.8 8.0 5.8 5.2  NEUTROABS 4.9  --   --   --   HGB 8.8* 8.2* 8.1* 7.8*  HCT 27.3* 26.0* 25.7* 24.6*  MCV 89.2 90.3 91.5 90.1  PLT 110* 115*  103* 91*   Basic Metabolic Panel:  Recent Labs Lab 01/23/16 1323 01/24/16 0423 01/25/16 0436 01/26/16 0335  NA 140 142 141 140  K 4.2 4.1 4.3 3.7  CL 112* 113* 111 112*  CO2 16* 18* 18* 18*  GLUCOSE 123* 106* 98 101*  BUN 89* 90* 91* 93*  CREATININE 5.87* 5.95* 6.14* 6.02*  CALCIUM 8.6* 8.5* 8.6* 8.5*  PHOS  --   --  7.6* 7.6*   GFR: Estimated Creatinine Clearance: 15.1 mL/Garrett (by C-G formula based on SCr of 6.02 mg/dL (H)). Liver Function Tests:  Recent Labs Lab 01/23/16 1323 01/26/16 0335  AST 22  --   ALT 19  --   ALKPHOS 61  --   BILITOT 0.4  --   PROT 6.2*  --   ALBUMIN 3.9 3.5   No results for input(s): LIPASE, AMYLASE in the last 168 hours. No results for input(s): AMMONIA in the last 168 hours. Coagulation Profile:  Recent Labs Lab 01/23/16 2305  INR 1.27   Cardiac Enzymes: No results for input(s): CKTOTAL,  CKMB, CKMBINDEX, TROPONINI in the last 168 hours. BNP (last 3 results) No results for input(s): PROBNP in the last 8760 hours. HbA1C:  Recent Labs  01/23/16 2305  HGBA1C 5.7*   CBG: No results for input(s): GLUCAP in the last 168 hours. Lipid Profile: No results for input(s): CHOL, HDL, LDLCALC, TRIG, CHOLHDL, LDLDIRECT in the last 72 hours. Thyroid Function Tests:  Recent Labs  01/23/16 2305  TSH 1.035   Anemia Panel: No results for input(s): VITAMINB12, FOLATE, FERRITIN, TIBC, IRON, RETICCTPCT in the last 72 hours. Sepsis Labs: No results for input(s): PROCALCITON, LATICACIDVEN in the last 168 hours.  No results found for this or any previous visit (from the past 240 hour(s)).       Radiology Studies: No results found.      Scheduled Meds: . amLODipine  10 mg Oral Daily  . aspirin EC  81 mg Oral Daily  . calcium acetate  667 mg Oral TID WC  . carvedilol  25 mg Oral BID WC  . cholecalciferol  3,000 Units Oral Daily  . darbepoetin (ARANESP) injection - NON-DIALYSIS  100 mcg Subcutaneous Q Thu-1800  . fluticasone  1  spray Each Nare Daily  . furosemide  80 mg Intravenous Q8H  . gabapentin  300 mg Oral QHS  . heparin  5,000 Units Subcutaneous Q8H  . hydrALAZINE  100 mg Oral TID  . ixazomib citrate  3 mg Oral Q14 Days  . loratadine  10 mg Oral Daily  . metolazone  5 mg Oral BID  . sodium bicarbonate  650 mg Oral BID  . sodium chloride flush  3 mL Intravenous Q12H  . vitamin B-12  50 mcg Oral Daily   Continuous Infusions:   LOS: 3 days    Time spent: 30 minutes    THOMPSON,DANIEL, MD [facility] Pager [phone]  If 7PM-7AM, please contact night-coverage [email or web address] Password [facility] 01/26/2016, 12:31 PM

## 2016-01-26 NOTE — Progress Notes (Signed)
Bedside report received from off going nurse. Patient in bed resting. No needs voiced.Will continue to monitor. Hermina Barters RN 01/26/2016 5:08 AM

## 2016-01-27 ENCOUNTER — Inpatient Hospital Stay (HOSPITAL_COMMUNITY): Payer: Self-pay

## 2016-01-27 ENCOUNTER — Encounter (HOSPITAL_COMMUNITY): Payer: Self-pay | Admitting: Physician Assistant

## 2016-01-27 HISTORY — PX: IR GENERIC HISTORICAL: IMG1180011

## 2016-01-27 LAB — CBC
HEMATOCRIT: 23.6 % — AB (ref 39.0–52.0)
Hemoglobin: 7.7 g/dL — ABNORMAL LOW (ref 13.0–17.0)
MCH: 29.2 pg (ref 26.0–34.0)
MCHC: 32.6 g/dL (ref 30.0–36.0)
MCV: 89.4 fL (ref 78.0–100.0)
PLATELETS: 87 10*3/uL — AB (ref 150–400)
RBC: 2.64 MIL/uL — ABNORMAL LOW (ref 4.22–5.81)
RDW: 18.7 % — AB (ref 11.5–15.5)
WBC: 5.4 10*3/uL (ref 4.0–10.5)

## 2016-01-27 LAB — RENAL FUNCTION PANEL
ALBUMIN: 3.6 g/dL (ref 3.5–5.0)
Anion gap: 13 (ref 5–15)
BUN: 97 mg/dL — AB (ref 6–20)
CO2: 18 mmol/L — ABNORMAL LOW (ref 22–32)
CREATININE: 6.16 mg/dL — AB (ref 0.61–1.24)
Calcium: 8.5 mg/dL — ABNORMAL LOW (ref 8.9–10.3)
Chloride: 107 mmol/L (ref 101–111)
GFR calc Af Amer: 10 mL/min — ABNORMAL LOW (ref >60)
GFR, EST NON AFRICAN AMERICAN: 9 mL/min — AB (ref >60)
GLUCOSE: 107 mg/dL — AB (ref 65–99)
PHOSPHORUS: 7.5 mg/dL — AB (ref 2.5–4.6)
POTASSIUM: 3.6 mmol/L (ref 3.5–5.1)
SODIUM: 138 mmol/L (ref 135–145)

## 2016-01-27 MED ORDER — SODIUM CHLORIDE 0.9 % IV SOLN: 100.0000 mL | INTRAVENOUS | Status: DC | PRN

## 2016-01-27 MED ORDER — PENTAFLUOROPROP-TETRAFLUOROETH EX AERO: 1.0000 "application " | INHALATION_SPRAY | CUTANEOUS | Status: DC | PRN

## 2016-01-27 MED ORDER — MIDAZOLAM HCL 2 MG/2ML IJ SOLN
INTRAMUSCULAR | Status: AC | PRN
  Administered 2016-01-27 (×2): 1 mg via INTRAVENOUS

## 2016-01-27 MED ORDER — ALTEPLASE 2 MG IJ SOLR: 2.0000 mg | Freq: Once | INTRAMUSCULAR | Status: DC | PRN

## 2016-01-27 MED ORDER — LIDOCAINE HCL 1 % IJ SOLN
INTRAMUSCULAR | Status: AC | PRN
  Administered 2016-01-27: 15 mL

## 2016-01-27 MED ORDER — LIDOCAINE-PRILOCAINE 2.5-2.5 % EX CREA
1.0000 "application " | TOPICAL_CREAM | CUTANEOUS | Status: DC | PRN
  Filled 2016-01-27: qty 5

## 2016-01-27 MED ORDER — FENTANYL CITRATE (PF) 100 MCG/2ML IJ SOLN
INTRAMUSCULAR | Status: AC
  Filled 2016-01-27: qty 2

## 2016-01-27 MED ORDER — FENTANYL CITRATE (PF) 100 MCG/2ML IJ SOLN
INTRAMUSCULAR | Status: AC | PRN
  Administered 2016-01-27 (×2): 50 ug via INTRAVENOUS

## 2016-01-27 MED ORDER — CEFAZOLIN SODIUM-DEXTROSE 2-4 GM/100ML-% IV SOLN
2.0000 g | Freq: Once | INTRAVENOUS | Status: AC
  Administered 2016-01-27: 2 g via INTRAVENOUS
  Filled 2016-01-27: qty 100

## 2016-01-27 MED ORDER — MIDAZOLAM HCL 2 MG/2ML IJ SOLN
INTRAMUSCULAR | Status: AC
  Filled 2016-01-27: qty 4

## 2016-01-27 MED ORDER — LIDOCAINE HCL 1 % IJ SOLN
INTRAMUSCULAR | Status: AC
  Filled 2016-01-27: qty 20

## 2016-01-27 MED ORDER — HEPARIN SODIUM (PORCINE) 1000 UNIT/ML IJ SOLN
INTRAMUSCULAR | Status: AC
  Filled 2016-01-27: qty 1

## 2016-01-27 MED ORDER — HEPARIN SODIUM (PORCINE) 1000 UNIT/ML DIALYSIS
1000.0000 [IU] | INTRAMUSCULAR | Status: DC | PRN
  Filled 2016-01-27: qty 1

## 2016-01-27 MED ORDER — LIDOCAINE HCL (PF) 1 % IJ SOLN: 5.0000 mL | INTRAMUSCULAR | Status: DC | PRN

## 2016-01-27 MED ORDER — CEFAZOLIN SODIUM-DEXTROSE 2-4 GM/100ML-% IV SOLN
INTRAVENOUS | Status: AC
  Administered 2016-01-27: 2 g via INTRAVENOUS
  Filled 2016-01-27: qty 100

## 2016-01-27 NOTE — Sedation Documentation (Signed)
Patient is resting comfortably. 

## 2016-01-27 NOTE — H&P (Signed)
Chief Complaint: Short of breath Edema Kidney Disease  Referring Physician(s): Madelon Lips  Supervising Physician: Arne Cleveland  Patient Status: [facility] - In-pt  History of Present Illness: Jose Garrett is a 65 y.o. male with medical history significant of AL Amyloidosis, CKD stage 4-5.   Dr. [name] is his nephrologist for his CKD  He had a right forearm fistula created on 12/27/2015 for anticipation of dialysis but it is not quite mature enough to be accessed yet.  He has had several weeks of worsenng lower extremity edema .  He has scrotal edema, has DOE and PND and orthopnea.   His Lasix was increased recently from 40 to 80 mg by Dr. [name] but he continues to have worsening of his fluid status.  We are asked to place a tunneled hemodialysis catheter so dialysis can be started ASAP.  Past Medical History:  Diagnosis Date  . Amyloidosis ([place])   . Anemia Dx 2016  . Chronic diastolic CHF (congestive heart failure), NYHA class 1 ([place])    /hospital problem list 09/08/2014  . Chronic renal disease, stage III    /hospital problem list 09/08/2014  . Diarrhea   . Dyspnea    with exertion  . Enlarged heart 2015  . Family history of adverse reaction to anesthesia    "my daughter can't take certain anesthesia agents" (09/08/2014)  . GERD (gastroesophageal reflux disease)   . History of blood transfusion 09/08/2014   "got hematoma after renal biopsy & HgB dropped"  . Hyperlipidemia Dx 2012  . Hypertension Dx 2012  . Renal disorder    "there is a spot on my kidney" (09/08/2014)  . Restless legs     Past Surgical History:  Procedure Laterality Date  . AV FISTULA PLACEMENT Right 12/27/2015   Procedure: Right Arm ARTERIOVENOUS (AV) FISTULA CREATION;  Surgeon: Angelia Mould, MD;  Location: [place];  Service: Vascular;  Laterality: Right;  . COLONOSCOPY    . LAPAROSCOPIC CHOLECYSTECTOMY  03/2011  . RENAL BIOPSY, PERCUTANEOUS Right 09/08/2014  .  TONSILLECTOMY  ~ 1960    Allergies: No known allergies  Medications: Prior to Admission medications   Medication Sig Start Date End Date Taking? Authorizing Provider  acetaminophen (TYLENOL) 325 MG tablet Take 325-650 mg by mouth every 6 (six) hours as needed (back pain).   Yes Historical Provider, MD  acyclovir (ZOVIRAX) 400 MG tablet TAKE 1 TABLET BY MOUTH DAILY. (DOSE ADJUSTED FOR RENAL FUNCTION) 06/01/15  Yes [name], MD  amLODipine (NORVASC) 10 MG tablet Take 1 tablet (10 mg total) by mouth daily. Patient taking differently: Take 10 mg by mouth daily after lunch.  06/30/15  Yes [name], MD  aspirin EC 81 MG tablet Take 1 tablet (81 mg total) by mouth daily. 01/14/15  Yes [name], MD  calcium carbonate (TUMS - DOSED IN MG ELEMENTAL CALCIUM) 500 MG chewable tablet Chew 1-2 tablets by mouth 2 (two) times daily as needed for indigestion or heartburn.    Yes Historical Provider, MD  carvedilol (COREG) 25 MG tablet Take 1 tablet (25 mg total) by mouth 2 (two) times daily with a meal. 08/22/15  Yes Josalyn Funches, MD  cetirizine (ZYRTEC) 10 MG tablet Take 10 mg by mouth daily.   Yes Historical Provider, MD  Cholecalciferol (VITAMIN D3) 3000 units TABS Take 3,000 Units by mouth daily.   Yes Historical Provider, MD  Cyanocobalamin (VITAMIN B-12) 1000 MCG/15ML LIQD Take 1 mL by mouth daily.   Yes  Historical Provider, MD  Epoetin Alfa (PROCRIT IJ) Inject as directed every 14 (fourteen) days. Done at [facility] Last injection 01/19/16   Yes Historical Provider, MD  FLONASE 50 MCG/ACT nasal spray Place 1 spray into both nostrils daily as needed. Patient taking differently: Place 1 spray into both nostrils daily as needed for allergies or rhinitis.  12/27/15  Yes Alvia Grove, PA-C  furosemide (LASIX) 80 MG tablet Take 80 mg by mouth 2 (two) times daily.   Yes Historical Provider, MD  gabapentin (NEURONTIN) 250 MG/5ML solution Take 10 mLs (500 mg total) by mouth  at bedtime. 12/20/15  Yes [name], MD  hydrALAZINE (APRESOLINE) 100 MG tablet Take 1 tablet (100 mg total) by mouth 3 (three) times daily. 06/30/15  Yes Josalyn Funches, MD  ixazomib citrate (NINLARO) 3 MG capsule Take 1 capsule (3 mg total) by mouth once a week. (on D1,8 and 15 every 28 days)Take on an empty stomach 1hr before or 2hrs after food. Do not crush, chew, or open. Patient taking differently: Take 3 mg by mouth every 14 (fourteen) days. Every other Thursday 11/29/15  Yes [name], MD  LORazepam (ATIVAN) 1 MG tablet Take 1 mg by mouth daily as needed for anxiety (nausea).    Yes Historical Provider, MD  ondansetron (ZOFRAN) 8 MG tablet Take 8 mg by mouth every 8 (eight) hours as needed for nausea or vomiting. Reported on 07/08/2015   Yes Historical Provider, MD  oxyCODONE (OXY IR/ROXICODONE) 5 MG immediate release tablet Take 1 tablet (5 mg total) by mouth every 6 (six) hours as needed for severe pain. 12/27/15  Yes Alvia Grove, PA-C  cetirizine (ZYRTEC) 5 MG tablet Take 1 tablet (5 mg total) by mouth daily. Patient not taking: Reported on 01/23/2016 08/22/15   [name], MD     Family History  Problem Relation Age of Onset  . Hypertension Mother   . Diabetes Mother   . Heart disease Mother   . Skin cancer Mother   . Hypertension Father   . Diabetes Father   . Diabetes Sister     Social History   Social History  . Marital status: Divorced    Spouse name: N/A  . Number of children: N/A  . Years of education: N/A   Social History Main Topics  . Smoking status: Former Smoker    Packs/day: 2.00    Years: 40.00    Types: Cigarettes    Quit date: 03/19/2011  . Smokeless tobacco: Never Used  . Alcohol use No  . Drug use: No     Comment: 09/08/2014 "I have used marijuana till the 1990's". Notes that he quit 15 yrs ago  . Sexual activity: Not Currently   Other Topics Concern  . None   Social History Narrative  . None    Review of Systems: A  12 point ROS discussed  Review of Systems  Constitutional: Positive for unexpected weight change. Negative for activity change, appetite change, chills and fatigue.  Respiratory: Positive for shortness of breath.   Cardiovascular: Positive for leg swelling.  Gastrointestinal: Negative for abdominal pain, nausea and vomiting.  Genitourinary: Negative.   Musculoskeletal: Negative.   Skin: Negative.   Neurological: Negative.   Hematological: Negative.   Psychiatric/Behavioral: Negative.     Vital Signs: BP (!) 118/50 (BP Location: Left Arm)   Pulse 64   Temp 97.5 F (36.4 C) (Oral)   Resp 18   Ht 5\' 10"  (1.778 m)  Wt 225 lb 9.6 oz (102.3 kg) Comment: scale a  SpO2 96%   BMI 32.37 kg/m   Physical Exam  Constitutional: He is oriented to person, place, and time. He appears well-developed and well-nourished.  HENT:  Head: Normocephalic and atraumatic.  Eyes: EOM are normal.  Neck: Normal range of motion.  Cardiovascular: Normal rate, regular rhythm and normal heart sounds.   Pulmonary/Chest: Effort normal and breath sounds normal. He has no wheezes.  Abdominal: Soft. He exhibits no distension. There is no tenderness.  Musculoskeletal: He exhibits edema.  Neurological: He is alert and oriented to person, place, and time.  Skin: Skin is warm and dry.  Psychiatric: He has a normal mood and affect. His behavior is normal. Judgment and thought content normal.  Vitals reviewed.   Mallampati Score:  MD Evaluation Airway: WNL Heart: WNL Abdomen: WNL Chest/ Lungs: WNL ASA  Classification: 3 Mallampati/Airway Score: Two  Imaging: Dg Chest 2 View  Result Date: 01/23/2016 CLINICAL DATA:  Increased shortness of breath over the past month. History of hypertension, CHF. EXAM: CHEST  2 VIEW COMPARISON:  PA and lateral chest x-ray of August 14, 2015 FINDINGS: The lungs are well-expanded. There small bilateral pleural effusions which are new. The interstitial markings are increased and  more conspicuous than on the previous study. The heart is top-normal in size. The pulmonary vascularity is prominent centrally but stable. There is calcification in the wall of the aortic arch. The bony thorax exhibits no acute abnormality. IMPRESSION: New bilateral pleural effusions and mild interstitial edema consistent with CHF. This is superimposed upon chronic bronchitic change. No alveolar pneumonia. Thoracic aortic atherosclerosis. Electronically Signed   By: David  Martinique M.D.   On: 01/23/2016 14:00    Labs:  CBC:  Recent Labs  01/24/16 0423 01/25/16 0436 01/26/16 0335 01/27/16 0232  WBC 8.0 5.8 5.2 5.4  HGB 8.2* 8.1* 7.8* 7.7*  HCT 26.0* 25.7* 24.6* 23.6*  PLT 115* 103* 91* 87*    COAGS:  Recent Labs  01/23/16 2305  INR 1.27    BMP:  Recent Labs  01/24/16 0423 01/25/16 0436 01/26/16 0335 01/27/16 0232  NA 142 141 140 138  K 4.1 4.3 3.7 3.6  CL 113* 111 112* 107  CO2 18* 18* 18* 18*  GLUCOSE 106* 98 101* 107*  BUN 90* 91* 93* 97*  CALCIUM 8.5* 8.6* 8.5* 8.5*  CREATININE 5.95* 6.14* 6.02* 6.16*  GFRNONAA 9* 9* 9* 9*  GFRAA 10* 10* 10* 10*    LIVER FUNCTION TESTS:  Recent Labs  11/01/15 0952  11/22/15 0914 11/22/15 0914 12/20/15 1010 12/20/15 1010 01/23/16 1323 01/26/16 0335 01/27/16 0232  BILITOT 0.48  --  0.41  --  0.40  --  0.4  --   --   AST 13  --  14  --  12  --  22  --   --   ALT 12  --  16  --  9  --  19  --   --   ALKPHOS 63  --  68  --  66  --  61  --   --   PROT 6.0*  < > 6.3* 5.6* 6.5 5.9* 6.2*  --   --   ALBUMIN 3.1*  --  3.0*  --  3.4*  --  3.9 3.5 3.6  < > = values in this interval not displayed.  TUMOR MARKERS: No results for input(s): AFPTM, CEA, CA199, CHROMGRNA in the last 8760 hours.  Assessment and  Plan:  Progressive renal failure with edema.  Now requiring hemodialysis.   Will proceed with placement of tunneled HD catheter today by Dr. [name].  Risks and Benefits discussed with the patient including, but not  limited to bleeding, infection, vascular injury, pneumothorax which may require chest tube placement, air embolism or even death.  All of the patient's questions were answered, patient is agreeable to proceed. Consent signed and in chart.   Electronically Signed: Murrell Redden PA-C 01/27/2016, 1:14 PM   I spent a total of 40 Minutes in face to face in clinical consultation, greater than 50% of which was counseling/coordinating care for permacath.

## 2016-01-27 NOTE — Progress Notes (Signed)
Dialysis treatment completed.  1500 mL ultrafiltrated.  1000 mL net fluid removal.  Patient status unchanged. Lung sounds diminished to ausculation in all fields. BLE 2+ pitting edema. Cardiac: NSR.  Cleansed RIJ catheter with chlorhexidine.  Disconnected lines and flushed ports with saline per protocol.  Ports locked with heparin and capped per protocol.    Report given to bedside, RN Rommie.

## 2016-01-27 NOTE — Progress Notes (Signed)
hemocath site still draining with small amount of serosanguinous drainage. dsg changed. Pressure dsg applied as advised by dialysis nurse. Observe site closely. No bleeding noted.

## 2016-01-27 NOTE — Procedures (Signed)
Late entry:  Patient seen and examined on Hemodialysis. QB 200 UF goal 1.5L.  Tolerating HD #1 well. Treatment adjusted as needed.  Madelon Lips MD [facility]  Cell [phone] Pgr: [phone] 9:48 PM

## 2016-01-27 NOTE — Progress Notes (Signed)
PROGRESS NOTE    Jose Garrett  ERX:[id number] DOB: 1950-04-07 DOA: 01/23/2016 PCP: [name], MD   Brief Narrative:  Jose Elvin Pooleis a 65 y.o.malewith medical history significant of AL Amyloidosis, CKD stage 4-5. Patient follows with Dr. [name] for his CKD, and a right forearm fistula created on 12/27/2015 for anticipation of dialysis. Patient reported for the several weeks lower extremity edema was worsening but for the past 10 days got really worse, he has scrotal edema, has DOE and PND and orthopnea. His Lasix was increased recently from 40 to 80 mg by Dr. [name] but he continues to have worsening of his fluid status so he came into the ED for further management.    Assessment & Plan:   Principal Problem:   Acute pulmonary edema (HCC) Active Problems:   DOE (dyspnea on exertion)   AL amyloidosis   CKD (chronic kidney disease) stage 4, GFR 15-29 ml/min (HCC)   Acute on chronic diastolic CHF (congestive heart failure) (HCC)   Fluid overload, unspecified   Acute renal failure superimposed on chronic kidney disease (HCC)   Hypervolemia  Acute pulmonary edema -Patient presented with SOB, DOE, orthopnea and massive lower extremity edema edema. -CXR showed bilateral effusion and interstitial edema, BNP is slightly elevated at 130. -This is secondary to stage IV-Vchronic kidney disease, with no significant improvement despite diuresis and a urine output of 3.1 L over the past 24 hours. -Started on IV diuresis and now on metolazone with a urine output of 3.1 L over the past 24 hours. - Patient with renal function that seems to be plateauing with increased IV Lasix and addition of metolazone.?? Is patient heading towards hemodialysis. Will defer diuresis to nephrology.  Stage IV-V CKD -Patient has stage V CKD secondary nephrotic range disease from to biopsy-proven AL amyloidosis. -Kidney biopsy done on 09/08/2014 showed AL amyloid. Patient was on 40 mg of  Lasix twice a day. -Recently appears to be worsening, right forearm fistula created on 12/27/2015. -Lasix increased to 80 mg every 8 hours and metalazone added to regimen. Patient with urine output of 3.1 L over the past 24 hours. Renal function seems to be plateauing. ??? Progressive kidney disease. Nephrology following and appreciate input and recommendations.  Acute on chronic diastolic CHF -On last echo done in September 2017 LVEF is 60-65% and grade 2 diastolic dysfunction. -Acute pulmonary edema likely secondary to fluid overload from worsening of his kidney disease. -This is now manifests as acute on chronic diastolic CHF, this will improve as the fluid overload improves. -Continue home medication including Coreg and hydralazine. - Patient on IV Lasix 80 mg every 8 hours and on metolazone. Patient with a urine output of 3.1 L over the past 24 hours and is -4369 mL during this hospitalization. Patient with renal function that seems to be plateauing with no significant improvement. Will defer increasing Lasix dose to nephrology. If no significant improvement and continued worsening of renal function despite diuresis patient may be heading towards hemodialysis. Per nephrology.  AL amyloidosis -With renal involvement, patient was following with Dr. [name]. -Is on ixazomib citrate, was on dexamethasone as well for that. Resumed acyclovir.  DOE -The massive lower extremity edema, DOE normal from the fluid overload. - Slowly clinically improving however still with significant volume overload. Patient on metolazone and high-dose IV Lasix. Renal function seems to be stabilizing and creatinine currently at 6.16 from 6.02 from 6.14 yesterday. Patient with a urine output of 3.1 L over the past 24  hours. Patient is  L d -4.369 during this hospitalization. Renal ff.     DVT prophylaxis: Heparin Code Status: Full Family Communication: Updated patient and family at bedside. Disposition Plan: Home  once volume overload has resolved and per nephrology.   Consultants:   Nephrology: 01/24/2016  Procedures:   Chest x-ray 01/23/2016    Antimicrobials:   None   Subjective: Patient states some improvement with shortness of breath. Patient with urine output of 3.1 L over the past 24 hours. Patient is -4.369 L during his hospitalization. No chest pain. Patient states he is feeling better however no significant change with his kidney numbers.   Objective: Vitals:   01/26/16 2031 01/27/16 0442 01/27/16 0726 01/27/16 1135  BP: (!) 128/42 (!) 135/53 (!) 135/41 (!) 118/50  Pulse: 73 76 94 64  Resp: 18 20 20 18   Temp: 97.6 F (36.4 C) 98.7 F (37.1 C) 97.6 F (36.4 C) 97.5 F (36.4 C)  TempSrc: Oral Oral Oral Oral  SpO2: 95% 94% 94% 96%  Weight:  102.3 kg (225 lb 9.6 oz)    Height:        Intake/Output Summary (Last 24 hours) at 01/27/16 1324 Last data filed at 01/27/16 1137  Gross per 24 hour  Intake              900 ml  Output             2825 ml  Net            -1925 ml   Filed Weights   01/25/16 0449 01/26/16 0500 01/27/16 0442  Weight: 102.7 kg (226 lb 8 oz) 103.4 kg (227 lb 14.4 oz) 102.3 kg (225 lb 9.6 oz)    Examination:  General exam: Appears calm and comfortable  Respiratory system: Decreased breath sounds in the bases. Some scattered crackles.  Cardiovascular system: S1 & S2 heard, RRR. + JVD, murmurs, rubs, gallops or clicks. 2-3+ bilateral lower extremity edema. Scrotal edema.  Gastrointestinal system: Abdomen is nondistended, soft and nontender. No organomegaly or masses felt. Normal bowel sounds heard. Central nervous system: Alert and oriented. No focal neurological deficits. Extremities: Symmetric 5 x 5 power. Skin: No rashes, lesions or ulcers Psychiatry: Judgement and insight appear normal. Mood & affect appropriate.     Data Reviewed: I have personally reviewed following labs and imaging studies  CBC:  Recent Labs Lab 01/23/16 1323  01/24/16 0423 01/25/16 0436 01/26/16 0335 01/27/16 0232  WBC 6.8 8.0 5.8 5.2 5.4  NEUTROABS 4.9  --   --   --   --   HGB 8.8* 8.2* 8.1* 7.8* 7.7*  HCT 27.3* 26.0* 25.7* 24.6* 23.6*  MCV 89.2 90.3 91.5 90.1 89.4  PLT 110* 115* 103* 91* 87*   Basic Metabolic Panel:  Recent Labs Lab 01/23/16 1323 01/24/16 0423 01/25/16 0436 01/26/16 0335 01/27/16 0232  NA 140 142 141 140 138  K 4.2 4.1 4.3 3.7 3.6  CL 112* 113* 111 112* 107  CO2 16* 18* 18* 18* 18*  GLUCOSE 123* 106* 98 101* 107*  BUN 89* 90* 91* 93* 97*  CREATININE 5.87* 5.95* 6.14* 6.02* 6.16*  CALCIUM 8.6* 8.5* 8.6* 8.5* 8.5*  PHOS  --   --  7.6* 7.6* 7.5*   GFR: Estimated Creatinine Clearance: 14.7 mL/min (by C-G formula based on SCr of 6.16 mg/dL (H)). Liver Function Tests:  Recent Labs Lab 01/23/16 1323 01/26/16 0335 01/27/16 0232  AST 22  --   --   ALT  19  --   --   ALKPHOS 61  --   --   BILITOT 0.4  --   --   PROT 6.2*  --   --   ALBUMIN 3.9 3.5 3.6   No results for input(s): LIPASE, AMYLASE in the last 168 hours. No results for input(s): AMMONIA in the last 168 hours. Coagulation Profile:  Recent Labs Lab 01/23/16 2305  INR 1.27   Cardiac Enzymes: No results for input(s): CKTOTAL, CKMB, CKMBINDEX, TROPONINI in the last 168 hours. BNP (last 3 results) No results for input(s): PROBNP in the last 8760 hours. HbA1C: No results for input(s): HGBA1C in the last 72 hours. CBG: No results for input(s): GLUCAP in the last 168 hours. Lipid Profile: No results for input(s): CHOL, HDL, LDLCALC, TRIG, CHOLHDL, LDLDIRECT in the last 72 hours. Thyroid Function Tests: No results for input(s): TSH, T4TOTAL, FREET4, T3FREE, THYROIDAB in the last 72 hours. Anemia Panel: No results for input(s): VITAMINB12, FOLATE, FERRITIN, TIBC, IRON, RETICCTPCT in the last 72 hours. Sepsis Labs: No results for input(s): PROCALCITON, LATICACIDVEN in the last 168 hours.  No results found for this or any previous visit (from  the past 240 hour(s)).       Radiology Studies: No results found.      Scheduled Meds: . acyclovir  400 mg Oral Daily  . amLODipine  10 mg Oral Daily  . aspirin EC  81 mg Oral Daily  . calcium acetate  667 mg Oral TID WC  . carvedilol  25 mg Oral BID WC  . cholecalciferol  3,000 Units Oral Daily  . darbepoetin (ARANESP) injection - NON-DIALYSIS  100 mcg Subcutaneous Q Thu-1800  . fluticasone  1 spray Each Nare Daily  . furosemide  80 mg Intravenous Q8H  . gabapentin  300 mg Oral QHS  . heparin  5,000 Units Subcutaneous Q8H  . hydrALAZINE  100 mg Oral TID  . ixazomib citrate  3 mg Oral Q14 Days  . loratadine  10 mg Oral Daily  . metolazone  5 mg Oral BID  . sodium bicarbonate  650 mg Oral BID  . sodium chloride flush  3 mL Intravenous Q12H  . vitamin B-12  50 mcg Oral Daily   Continuous Infusions:   LOS: 4 days    Time spent: 30 minutes    THOMPSON,DANIEL, MD [facility] Pager [phone]  If 7PM-7AM, please contact night-coverage [email or web address] Password [email or web address] 01/27/2016, 1:24 PM

## 2016-01-27 NOTE — Sedation Documentation (Addendum)
Sats dropped 80's-placed on NRB for support

## 2016-01-27 NOTE — Procedures (Signed)
R IJ tunneled HD cathter placement with [place] and fluoroscopy No complication No blood loss. See complete dictation in [facility].

## 2016-01-27 NOTE — Progress Notes (Signed)
[facility] Progress Note     Subjective:    Increased diuresis with metolazone and Lasix TID but not really making a dent in weight or overall edema.   Needs to start HD--> discussed today, orders placed for [facility].     Objective:   BP (!) 152/64   Pulse 68   Temp 97.5 F (36.4 C) (Oral)   Resp 14   Ht 5\' 10"  (1.778 m)   Wt 102.3 kg (225 lb 9.6 oz) Comment: scale a  SpO2 93%   BMI 32.37 kg/m   Intake/Output Summary (Last 24 hours) at 01/27/16 1525 Last data filed at 01/27/16 1337  Gross per 24 hour  Intake              900 ml  Output             2825 ml  Net            -1925 ml   Weight change: -1.043 kg (-2 lb 4.8 oz)  Physical Exam: Gen: sleeping but easily arousable  HEENT: EOMI, PERRL, MMM NECK: no JVD at 90 degrees CVS: Holosystolic murmur noted, RRR otherwise  Resp: CTAB, no increased WOB  Abd: Distended, no ttp  Ext: 3+ LE edema, the same as yesterday  Imaging: No results found.  Labs: BMET  Recent Labs Lab 01/23/16 1323 01/24/16 0423 01/25/16 0436 01/26/16 0335 01/27/16 0232  NA 140 142 141 140 138  K 4.2 4.1 4.3 3.7 3.6  CL 112* 113* 111 112* 107  CO2 16* 18* 18* 18* 18*  GLUCOSE 123* 106* 98 101* 107*  BUN 89* 90* 91* 93* 97*  CREATININE 5.87* 5.95* 6.14* 6.02* 6.16*  CALCIUM 8.6* 8.5* 8.6* 8.5* 8.5*  PHOS  --   --  7.6* 7.6* 7.5*   CBC  Recent Labs Lab 01/23/16 1323 01/24/16 0423 01/25/16 0436 01/26/16 0335 01/27/16 0232  WBC 6.8 8.0 5.8 5.2 5.4  NEUTROABS 4.9  --   --   --   --   HGB 8.8* 8.2* 8.1* 7.8* 7.7*  HCT 27.3* 26.0* 25.7* 24.6* 23.6*  MCV 89.2 90.3 91.5 90.1 89.4  PLT 110* 115* 103* 91* 87*    Medications:    . fentaNYL      . heparin      . lidocaine      . midazolam      . acyclovir  400 mg Oral Daily  . amLODipine  10 mg Oral Daily  . aspirin EC  81 mg Oral Daily  . calcium acetate  667 mg Oral TID WC  . carvedilol  25 mg Oral BID WC  .  ceFAZolin (ANCEF) IV  2 g Intravenous Once  .  cholecalciferol  3,000 Units Oral Daily  . darbepoetin (ARANESP) injection - NON-DIALYSIS  100 mcg Subcutaneous Q Thu-1800  . fluticasone  1 spray Each Nare Daily  . furosemide  80 mg Intravenous Q8H  . gabapentin  300 mg Oral QHS  . heparin  5,000 Units Subcutaneous Q8H  . hydrALAZINE  100 mg Oral TID  . ixazomib citrate  3 mg Oral Q14 Days  . loratadine  10 mg Oral Daily  . metolazone  5 mg Oral BID  . sodium bicarbonate  650 mg Oral BID  . sodium chloride flush  3 mL Intravenous Q12H  . vitamin B-12  50 mcg Oral Daily   Assessment/ Plan:   Background: 65 year old gentleman with history of amyloidosis, hypertension, CKD stage 5, chronic  diastolic heart failure presenting with acute volume overload. Patient with rising serum creatinine over the last 24 hours as well as baseline serum creatinine has been generally rising over the past several months.   1. Progressive CKD (CKD stage 5)--> ESRD: Nephrologist Dr. [name]. Patient presenting with likely cardiorenal syndrome in the setting of amyloidosis and HTN. Baseline SCr 3.0, however rising over the past several months consistent with progressive kidney dysfunction.  Last renal ultrasound in 2016 showed kidneys showed 12.9 cm/11.6 cm respectively right and left. Patient with a fistula placed on 12/27/2015, and will need maturation before future use.  - Continue Lasix 80 IV TID and metolazone 5 mg BID--> better UOP after dose. - still with lots of LE edema.   - needs to start dialysis  - placing [facility] for hopeful start tonight/ tomorrow - CLIP  2. Anemia: Hgb 8.2. Currently on Procrit 20,000 units every two weeks. Last obtained on 10/20 Iron 65 and saturation of 24. Giving Aranesp 100 q week here. (start 11/16)  3.Metabolic Bone disease: PTH elevated at 213 10/20. Currently on cholecalciferol 3,000 units. Phosphorus level 7.6. Calcium acetate 667 with meals, renal diet  4. Acute on chronic diastolic heart failure - September 2017 LVEF  is 65-70% and grade 2 diastolic dysfunction, severe concentric hypertrophy, PA pressure normal. -  Currently on Coreg, hydralazine,  5. AL Amyloidosis - on ixazomib citrate   6.  Nutrition: alb 3.6.  Have talked about increased protein intake.  Madelon Lips MD [facility] Cell [phone] pgr [phone] 01/27/2016, 3:25 PM

## 2016-01-27 NOTE — Sedation Documentation (Signed)
HD place R neck - will wean 02 before returning to unit

## 2016-01-27 NOTE — Progress Notes (Signed)
Patient arrived to unit by bed.  Reviewed treatment plan and this RN agrees with plan.  Report received from bedside RN, Janett Billow.  Consent obtained.  Patient A & O X 4.   Lung sounds diminished to ausculation in all fields. BLE 2+ edema. Cardiac:  NSR.  Removed caps and cleansed RIJ catheter with chlorhedxidine.  Aspirated ports of heparin and flushed them with saline per protocol.  Connected and secured lines, initiated treatment at 1739.  UF Goal of 1575mL and net fluid removal 1L.  Will continue to monitor.

## 2016-01-27 NOTE — Sedation Documentation (Addendum)
Sat 95% 3L/[place]- will transoport pt back to floor on 02 3L/[place]- RN on floor aware

## 2016-01-27 NOTE — Progress Notes (Signed)
Received from dialysis .teleSR 73 hr. 144/63 sat=93% room air.RIJ cath dsg intact w/ small amount of bleeding noted on side of dsg. Cleansed. Observe cl;osely

## 2016-01-28 DIAGNOSIS — N186 End stage renal disease: Secondary | ICD-10-CM

## 2016-01-28 DIAGNOSIS — N185 Chronic kidney disease, stage 5: Secondary | ICD-10-CM

## 2016-01-28 DIAGNOSIS — Z992 Dependence on renal dialysis: Secondary | ICD-10-CM

## 2016-01-28 LAB — HEPATITIS B CORE ANTIBODY, TOTAL: HEP B C TOTAL AB: NEGATIVE

## 2016-01-28 LAB — CBC
HEMATOCRIT: 26.3 % — AB (ref 39.0–52.0)
HEMOGLOBIN: 8.5 g/dL — AB (ref 13.0–17.0)
MCH: 29 pg (ref 26.0–34.0)
MCHC: 32.3 g/dL (ref 30.0–36.0)
MCV: 89.8 fL (ref 78.0–100.0)
Platelets: 101 10*3/uL — ABNORMAL LOW (ref 150–400)
RBC: 2.93 MIL/uL — AB (ref 4.22–5.81)
RDW: 18.5 % — ABNORMAL HIGH (ref 11.5–15.5)
WBC: 4.8 10*3/uL (ref 4.0–10.5)

## 2016-01-28 LAB — IRON AND TIBC
IRON: 45 ug/dL (ref 45–182)
SATURATION RATIOS: 16 % — AB (ref 17.9–39.5)
TIBC: 280 ug/dL (ref 250–450)
UIBC: 235 ug/dL

## 2016-01-28 LAB — RENAL FUNCTION PANEL
ANION GAP: 13 (ref 5–15)
Albumin: 3.7 g/dL (ref 3.5–5.0)
BUN: 73 mg/dL — ABNORMAL HIGH (ref 6–20)
CALCIUM: 8.5 mg/dL — AB (ref 8.9–10.3)
CO2: 22 mmol/L (ref 22–32)
Chloride: 105 mmol/L (ref 101–111)
Creatinine, Ser: 5.03 mg/dL — ABNORMAL HIGH (ref 0.61–1.24)
GFR calc non Af Amer: 11 mL/min — ABNORMAL LOW (ref >60)
GFR, EST AFRICAN AMERICAN: 13 mL/min — AB (ref >60)
Glucose, Bld: 91 mg/dL (ref 65–99)
POTASSIUM: 3.4 mmol/L — AB (ref 3.5–5.1)
Phosphorus: 6.1 mg/dL — ABNORMAL HIGH (ref 2.5–4.6)
SODIUM: 140 mmol/L (ref 135–145)

## 2016-01-28 LAB — HEPATITIS B SURFACE ANTIBODY,QUALITATIVE: HEP B S AB: NONREACTIVE

## 2016-01-28 LAB — HEPATITIS B SURFACE ANTIGEN: Hepatitis B Surface Ag: NEGATIVE

## 2016-01-28 MED ORDER — TORSEMIDE 20 MG PO TABS: 80.0000 mg | ORAL_TABLET | Freq: Every day | ORAL | Status: DC

## 2016-01-28 NOTE — Progress Notes (Signed)
[facility] Progress Note     Subjective:    Had first dialysis treatment yesterday.  Tolerated everything well.  LE edema now really improving.     Objective:   BP 138/65 (BP Location: Left Arm)   Pulse 70   Temp 98.2 F (36.8 C) (Oral)   Resp 20   Ht 5\' 10"  (1.778 m)   Wt 99.8 kg (220 lb 1.6 oz) Comment: scale a  SpO2 94%   BMI 31.58 kg/m   Intake/Output Summary (Last 24 hours) at 01/28/16 1256 Last data filed at 01/28/16 1219  Gross per 24 hour  Intake              603 ml  Output             2000 ml  Net            -1397 ml   Weight change: 3.968 kg (8 lb 12 oz)  Physical Exam: Gen: sleeping but easily arousable  HEENT: EOMI, PERRL, MMM NECK: no JVD at 90 degrees CVS: Holosystolic murmur noted, RRR otherwise  Resp: CTAB, no increased WOB  Abd: less distended Ext: 3+ LE edema, improved  Imaging: Ir Fluoro Guide Cv Line Right  Result Date: 01/27/2016 CLINICAL DATA:  Renal failure, needs IV access for hemodialysis. EXAM: TUNNELED HEMODIALYSIS CATHETER PLACEMENT WITH ULTRASOUND AND FLUOROSCOPIC GUIDANCE TECHNIQUE: The procedure, risks, benefits, and alternatives were explained to the patient. Questions regarding the procedure were encouraged and answered. The patient understands and consents to the procedure. As antibiotic prophylaxis, cefazolin 2 g was ordered pre-procedure and administered intravenously within one hour of incision.Patency of the right IJ vein was confirmed with ultrasound with image documentation. An appropriate skin site was determined. Region was prepped using maximum barrier technique including cap and mask, sterile gown, sterile gloves, large sterile sheet, and Chlorhexidine as cutaneous antisepsis. The region was infiltrated locally with 1% lidocaine. Intravenous Fentanyl and Versed were administered as conscious sedation during continuous monitoring of the patient's level of consciousness and physiological / cardiorespiratory status  by the radiology RN, with a total moderate sedation time of 15 minutes. Under real-time ultrasound guidance, the right IJ vein was accessed with a 21 gauge micropuncture needle; the needle tip within the vein was confirmed with ultrasound image documentation. Needle exchanged over the 018 guidewire for transitional dilator, which allowed advancement of a Benson wire into the IVC. Over this, an MPA catheter was advanced. A Palindrome 23 hemodialysis catheter was tunneled from the right anterior chest wall approach to the right IJ dermatotomy site. The MPA catheter was exchanged over an Amplatz wire for serial vascular dilators which allow placement of a peel-away sheath, through which the catheter was advanced under intermittent fluoroscopy, positioned with its tips in the proximal and midright atrium. Spot chest radiograph confirms good catheter position. No pneumothorax. Catheter was flushed and primed per protocol. Catheter secured externally with O Prolene sutures. The right IJ dermatotomy site was closed with Dermabond. COMPLICATIONS: COMPLICATIONS None immediate FLUOROSCOPY TIME:  18 seconds, 2 mGy COMPARISON:  None IMPRESSION: 1. Technically successful placement of tunneled right IJ hemodialysis catheter with ultrasound and fluoroscopic guidance. Ready for routine use. ACCESS: Remains approachable for percutaneous intervention as needed. Electronically Signed   By: Lucrezia Europe M.D.   On: 01/27/2016 16:18   Ir US Guide Vasc Access Right  Result Date: 01/27/2016 CLINICAL DATA:  Renal failure, needs IV access for hemodialysis. EXAM: TUNNELED HEMODIALYSIS CATHETER PLACEMENT WITH ULTRASOUND AND FLUOROSCOPIC GUIDANCE TECHNIQUE:  The procedure, risks, benefits, and alternatives were explained to the patient. Questions regarding the procedure were encouraged and answered. The patient understands and consents to the procedure. As antibiotic prophylaxis, cefazolin 2 g was ordered pre-procedure and administered  intravenously within one hour of incision.Patency of the right IJ vein was confirmed with ultrasound with image documentation. An appropriate skin site was determined. Region was prepped using maximum barrier technique including cap and mask, sterile gown, sterile gloves, large sterile sheet, and Chlorhexidine as cutaneous antisepsis. The region was infiltrated locally with 1% lidocaine. Intravenous Fentanyl and Versed were administered as conscious sedation during continuous monitoring of the patient's level of consciousness and physiological / cardiorespiratory status by the radiology RN, with a total moderate sedation time of 15 minutes. Under real-time ultrasound guidance, the right IJ vein was accessed with a 21 gauge micropuncture needle; the needle tip within the vein was confirmed with ultrasound image documentation. Needle exchanged over the 018 guidewire for transitional dilator, which allowed advancement of a Benson wire into the IVC. Over this, an MPA catheter was advanced. A Palindrome 23 hemodialysis catheter was tunneled from the right anterior chest wall approach to the right IJ dermatotomy site. The MPA catheter was exchanged over an Amplatz wire for serial vascular dilators which allow placement of a peel-away sheath, through which the catheter was advanced under intermittent fluoroscopy, positioned with its tips in the proximal and midright atrium. Spot chest radiograph confirms good catheter position. No pneumothorax. Catheter was flushed and primed per protocol. Catheter secured externally with O Prolene sutures. The right IJ dermatotomy site was closed with Dermabond. COMPLICATIONS: COMPLICATIONS None immediate FLUOROSCOPY TIME:  18 seconds, 2 mGy COMPARISON:  None IMPRESSION: 1. Technically successful placement of tunneled right IJ hemodialysis catheter with ultrasound and fluoroscopic guidance. Ready for routine use. ACCESS: Remains approachable for percutaneous intervention as needed.  Electronically Signed   By: Lucrezia Europe M.D.   On: 01/27/2016 16:18    Labs: BMET  Recent Labs Lab 01/23/16 1323 01/24/16 0423 01/25/16 0436 01/26/16 0335 01/27/16 0232 01/28/16 0404  NA 140 142 141 140 138 140  K 4.2 4.1 4.3 3.7 3.6 3.4*  CL 112* 113* 111 112* 107 105  CO2 16* 18* 18* 18* 18* 22  GLUCOSE 123* 106* 98 101* 107* 91  BUN 89* 90* 91* 93* 97* 73*  CREATININE 5.87* 5.95* 6.14* 6.02* 6.16* 5.03*  CALCIUM 8.6* 8.5* 8.6* 8.5* 8.5* 8.5*  PHOS  --   --  7.6* 7.6* 7.5* 6.1*   CBC  Recent Labs Lab 01/23/16 1323  01/25/16 0436 01/26/16 0335 01/27/16 0232 01/28/16 0404  WBC 6.8  < > 5.8 5.2 5.4 4.8  NEUTROABS 4.9  --   --   --   --   --   HGB 8.8*  < > 8.1* 7.8* 7.7* 8.5*  HCT 27.3*  < > 25.7* 24.6* 23.6* 26.3*  MCV 89.2  < > 91.5 90.1 89.4 89.8  PLT 110*  < > 103* 91* 87* 101*  < > = values in this interval not displayed.  Medications:    . acyclovir  400 mg Oral Daily  . amLODipine  10 mg Oral Daily  . aspirin EC  81 mg Oral Daily  . calcium acetate  667 mg Oral TID WC  . carvedilol  25 mg Oral BID WC  . cholecalciferol  3,000 Units Oral Daily  . darbepoetin (ARANESP) injection - NON-DIALYSIS  100 mcg Subcutaneous Q Thu-1800  . fluticasone  1 spray  Each Nare Daily  . furosemide  80 mg Intravenous Q8H  . gabapentin  300 mg Oral QHS  . heparin  5,000 Units Subcutaneous Q8H  . hydrALAZINE  100 mg Oral TID  . ixazomib citrate  3 mg Oral Q14 Days  . loratadine  10 mg Oral Daily  . metolazone  5 mg Oral BID  . sodium bicarbonate  650 mg Oral BID  . sodium chloride flush  3 mL Intravenous Q12H  . vitamin B-12  50 mcg Oral Daily   Assessment/ Plan:   Background: 65 year old gentleman with history of amyloidosis, hypertension, CKD stage 5, chronic diastolic heart failure presenting with acute volume overload. Patient with rising serum creatinine over the last 24 hours as well as baseline serum creatinine has been generally rising over the past several months.    1. Progressive CKD (CKD stage 5)--> ESRD: Nephrologist Dr. [name]. Patient presenting with likely cardiorenal syndrome in the setting of amyloidosis and HTN. Baseline SCr 3.0, however rising over the past several months consistent with progressive kidney dysfunction.  Last renal ultrasound in 2016 showed kidneys showed 12.9 cm/11.6 cm respectively right and left. Patient with a fistula placed on 12/27/2015, and will need maturation before future use.  - converting Lasix to torsemide to use on non-dialysis days--> 80 mg PO torsemide daily (don't think he will need as much diuretic as he's currently getting once we achieve euvolemia) - HD #1 11/17 - CLIP  2. Anemia: Hgb 8.2. Currently on Procrit 20,000 units every two weeks. Last obtained on 10/20 Iron 65 and saturation of 24. Giving Aranesp 100 q week here. (start 11/16).  Rechecking iron studies here today 11/18.  3.Metabolic Bone disease: PTH elevated at 213 10/20. Currently on cholecalciferol 3,000 units. Phosphorus level 7.6. Calcium acetate 667 with meals, renal diet  4. Acute on chronic diastolic heart failure - September 2017 LVEF is 65-70% and grade 2 diastolic dysfunction, severe concentric hypertrophy, PA pressure normal. -  Currently on Coreg, hydralazine,  5. AL Amyloidosis - on ixazomib citrate   6.  Nutrition: alb 3.6.  Have talked about increased protein intake.  Madelon Lips MD [facility] Cell [phone] pgr [phone] 01/28/2016, 12:56 PM

## 2016-01-28 NOTE — Progress Notes (Signed)
PROGRESS NOTE    Jose Garrett  YKZ:[id number] DOB: Jan 16, 1951 DOA: 01/23/2016 PCP: [name], MD   Brief Narrative:  Jose Garrett Pooleis a 65 y.o.malewith medical history significant of AL Amyloidosis, CKD stage 4-5. Patient follows with Dr. Moshe Garrett for his CKD, and a right forearm fistula created on 12/27/2015 for anticipation of dialysis. Patient reported for the several weeks lower extremity edema was worsening but for the past 10 days got really worse, he has scrotal edema, has DOE and PND and orthopnea. His Lasix was increased recently from 40 to 80 mg by Dr. Moshe Garrett but he continues to have worsening of his fluid status so he came into the ED for further management.    Assessment & Plan:   Principal Problem:   Chronic kidney disease with dialysis modality undecided, stage 5 [facility]): Progressive Active Problems:   DOE (dyspnea on exertion)   AL amyloidosis   Acute on chronic diastolic CHF (congestive heart failure) (HCC)   Fluid overload, unspecified   Acute pulmonary edema (HCC)   Acute renal failure superimposed on chronic kidney disease (HCC)   Hypervolemia  Acute pulmonary edema -Patient presented with SOB, DOE, orthopnea and massive lower extremity edema edema. -CXR showed bilateral effusion and interstitial edema, BNP is slightly elevated at 130. -This is secondary to progressive stage Vchronic kidney disease, with no significant improvement despite diuresis.  -Started on IV diuresis and now on metolazone with a urine output however no significant change in renal function.  - Patient with renal function that seems to be plateauing with increased IV Lasix and addition of metolazone. Tunneled HD catheter was placed yesterday and patient underwent hemodialysis yesterday 01/27/2016 with some clinical improvement. Patient for possible hemodialysis today. Per nephrology.  Progressive chronic kidney disease stage V >>> ESRD  -Patient has stage V CKD  secondary nephrotic range disease from to biopsy-proven AL amyloidosis. -Kidney biopsy done on 09/08/2014 showed AL amyloid. Patient was on 40 mg of Lasix twice a day. -Recently appears to be worsening renal function, right forearm fistula created on 12/27/2015. -Lasix increased to 80 mg every 8 hours and metalazone added to regimen. Due to no significant change with renal function tunneled HD catheter was placed yesterday per interventional radiology and patient underwent hemodialysis yesterday 01/27/2016 per nephrology. Patient for probable hemodialysis today.  Nephrology following and appreciate input and recommendations.  Acute on chronic diastolic CHF -On last echo done in September 2017 LVEF is 60-65% and grade 2 diastolic dysfunction. -Acute pulmonary edema likely secondary to fluid overload from worsening of his kidney disease. -This is now manifests as acute on chronic diastolic CHF, this will improve as the fluid overload improves. -Continue home medication including Coreg and hydralazine. - Patient on IV Lasix 80 mg every 8 hours and on metolazone. Due to patient's fluctuating/plateauing renal function tunneled HD catheter was placed and patient underwent hemodialysis yesterday with clinical improvement. Patient with a urine output of 1.875  L over the past 24 hours and 1 L removed during hemodialysis. Patient is - 5526 mL during this hospitalization. Patient with likely progressive chronic kidney disease stage V. Patient underwent hemodialysis yesterday and likely for hemodialysis today. Per nephrology.  AL amyloidosis -With renal involvement, patient was following with Dr. Irene Garrett. -Is on ixazomib citrate, was on dexamethasone as well for that. Resumed acyclovir.  DOE -The massive lower extremity edema, DOE normal from the fluid overload. - Slowly clinically improving however still with significant volume overload. Patient on metolazone and high-dose IV Lasix.  Renal function seems to  be stabilizing and creatinine currently at 5.03 from 6.16 from 6.02 from 6.14 yesterday. Patient with a urine output of 1.875 L over the past 24 hours. Patient during hemodialysis had a liter removed. Patient is - 5.526 L during this hospitalization. Renal ff.     DVT prophylaxis: Heparin Code Status: Full Family Communication: Updated patient and family at bedside. Disposition Plan: Home once volume overload has resolved and per nephrology.   Consultants:   Nephrology: 01/24/2016  Procedures:   Chest x-ray 01/23/2016  Tunneled HD catheter per Dr. [name] 01/27/2016  Antimicrobials:   None   Subjective: Patient states improvement with shortness of breath after hemodialysis yesterday.. Patient with urine output of 1.875 L over the past 24 hours. Patient is - 5.5-6 L during his hospitalization. No chest pain.   Objective: Vitals:   01/27/16 2127 01/28/16 0431 01/28/16 0942 01/28/16 1159  BP: (!) 127/105 (!) 132/46 (!) 135/57 138/65  Pulse: 77 65 88 70  Resp: 18 18  20   Temp: 97.7 F (36.5 C) 97.9 F (36.6 C)  98.2 F (36.8 C)  TempSrc: Oral Oral  Oral  SpO2: 97% 96% 92% 94%  Weight:  99.8 kg (220 lb 1.6 oz)    Height:        Intake/Output Summary (Last 24 hours) at 01/28/16 1224 Last data filed at 01/28/16 1219  Gross per 24 hour  Intake              603 ml  Output             2000 ml  Net            -1397 ml   Filed Weights   01/27/16 1728 01/27/16 1939 01/28/16 0431  Weight: 106.3 kg (234 lb 5.6 oz) 105.3 kg (232 lb 2.3 oz) 99.8 kg (220 lb 1.6 oz)    Examination:  General exam: Appears calm and comfortable  Respiratory system: Some scattered crackles.  Cardiovascular system: S1 & S2 heard, RRR. + JVD, murmurs, rubs, gallops or clicks. 2+ bilateral lower extremity edema. Scrotal edema.  Gastrointestinal system: Abdomen is nondistended, soft and nontender. No organomegaly or masses felt. Normal bowel sounds heard. Central nervous system: Alert and  oriented. No focal neurological deficits. Extremities: Symmetric 5 x 5 power. Skin: No rashes, lesions or ulcers Psychiatry: Judgement and insight appear normal. Mood & affect appropriate.     Data Reviewed: I have personally reviewed following labs and imaging studies  CBC:  Recent Labs Lab 01/23/16 1323 01/24/16 0423 01/25/16 0436 01/26/16 0335 01/27/16 0232 01/28/16 0404  WBC 6.8 8.0 5.8 5.2 5.4 4.8  NEUTROABS 4.9  --   --   --   --   --   HGB 8.8* 8.2* 8.1* 7.8* 7.7* 8.5*  HCT 27.3* 26.0* 25.7* 24.6* 23.6* 26.3*  MCV 89.2 90.3 91.5 90.1 89.4 89.8  PLT 110* 115* 103* 91* 87* 621*   Basic Metabolic Panel:  Recent Labs Lab 01/24/16 0423 01/25/16 0436 01/26/16 0335 01/27/16 0232 01/28/16 0404  NA 142 141 140 138 140  K 4.1 4.3 3.7 3.6 3.4*  CL 113* 111 112* 107 105  CO2 18* 18* 18* 18* 22  GLUCOSE 106* 98 101* 107* 91  BUN 90* 91* 93* 97* 73*  CREATININE 5.95* 6.14* 6.02* 6.16* 5.03*  CALCIUM 8.5* 8.6* 8.5* 8.5* 8.5*  PHOS  --  7.6* 7.6* 7.5* 6.1*   GFR: Estimated Creatinine Clearance: 17.8 mL/min (by C-G formula based on  SCr of 5.03 mg/dL (H)). Liver Function Tests:  Recent Labs Lab 01/23/16 1323 01/26/16 0335 01/27/16 0232 01/28/16 0404  AST 22  --   --   --   ALT 19  --   --   --   ALKPHOS 61  --   --   --   BILITOT 0.4  --   --   --   PROT 6.2*  --   --   --   ALBUMIN 3.9 3.5 3.6 3.7   No results for input(s): LIPASE, AMYLASE in the last 168 hours. No results for input(s): AMMONIA in the last 168 hours. Coagulation Profile:  Recent Labs Lab 01/23/16 2305  INR 1.27   Cardiac Enzymes: No results for input(s): CKTOTAL, CKMB, CKMBINDEX, TROPONINI in the last 168 hours. BNP (last 3 results) No results for input(s): PROBNP in the last 8760 hours. HbA1C: No results for input(s): HGBA1C in the last 72 hours. CBG: No results for input(s): GLUCAP in the last 168 hours. Lipid Profile: No results for input(s): CHOL, HDL, LDLCALC, TRIG, CHOLHDL,  LDLDIRECT in the last 72 hours. Thyroid Function Tests: No results for input(s): TSH, T4TOTAL, FREET4, T3FREE, THYROIDAB in the last 72 hours. Anemia Panel: No results for input(s): VITAMINB12, FOLATE, FERRITIN, TIBC, IRON, RETICCTPCT in the last 72 hours. Sepsis Labs: No results for input(s): PROCALCITON, LATICACIDVEN in the last 168 hours.  No results found for this or any previous visit (from the past 240 hour(s)).       Radiology Studies: Ir Fluoro Guide Cv Line Right  Result Date: 01/27/2016 CLINICAL DATA:  Renal failure, needs IV access for hemodialysis. EXAM: TUNNELED HEMODIALYSIS CATHETER PLACEMENT WITH ULTRASOUND AND FLUOROSCOPIC GUIDANCE TECHNIQUE: The procedure, risks, benefits, and alternatives were explained to the patient. Questions regarding the procedure were encouraged and answered. The patient understands and consents to the procedure. As antibiotic prophylaxis, cefazolin 2 g was ordered pre-procedure and administered intravenously within one hour of incision.Patency of the right IJ vein was confirmed with ultrasound with image documentation. An appropriate skin site was determined. Region was prepped using maximum barrier technique including cap and mask, sterile gown, sterile gloves, large sterile sheet, and Chlorhexidine as cutaneous antisepsis. The region was infiltrated locally with 1% lidocaine. Intravenous Fentanyl and Versed were administered as conscious sedation during continuous monitoring of the patient's level of consciousness and physiological / cardiorespiratory status by the radiology RN, with a total moderate sedation time of 15 minutes. Under real-time ultrasound guidance, the right IJ vein was accessed with a 21 gauge micropuncture needle; the needle tip within the vein was confirmed with ultrasound image documentation. Needle exchanged over the 018 guidewire for transitional dilator, which allowed advancement of a Benson wire into the IVC. Over this, an MPA  catheter was advanced. A Palindrome 23 hemodialysis catheter was tunneled from the right anterior chest wall approach to the right IJ dermatotomy site. The MPA catheter was exchanged over an Amplatz wire for serial vascular dilators which allow placement of a peel-away sheath, through which the catheter was advanced under intermittent fluoroscopy, positioned with its tips in the proximal and midright atrium. Spot chest radiograph confirms good catheter position. No pneumothorax. Catheter was flushed and primed per protocol. Catheter secured externally with O Prolene sutures. The right IJ dermatotomy site was closed with Dermabond. COMPLICATIONS: COMPLICATIONS None immediate FLUOROSCOPY TIME:  18 seconds, 2 mGy COMPARISON:  None IMPRESSION: 1. Technically successful placement of tunneled right IJ hemodialysis catheter with ultrasound and fluoroscopic guidance. Ready for routine  use. ACCESS: Remains approachable for percutaneous intervention as needed. Electronically Signed   By: Lucrezia Europe M.D.   On: 01/27/2016 16:18   Ir US Guide Vasc Access Right  Result Date: 01/27/2016 CLINICAL DATA:  Renal failure, needs IV access for hemodialysis. EXAM: TUNNELED HEMODIALYSIS CATHETER PLACEMENT WITH ULTRASOUND AND FLUOROSCOPIC GUIDANCE TECHNIQUE: The procedure, risks, benefits, and alternatives were explained to the patient. Questions regarding the procedure were encouraged and answered. The patient understands and consents to the procedure. As antibiotic prophylaxis, cefazolin 2 g was ordered pre-procedure and administered intravenously within one hour of incision.Patency of the right IJ vein was confirmed with ultrasound with image documentation. An appropriate skin site was determined. Region was prepped using maximum barrier technique including cap and mask, sterile gown, sterile gloves, large sterile sheet, and Chlorhexidine as cutaneous antisepsis. The region was infiltrated locally with 1% lidocaine. Intravenous  Fentanyl and Versed were administered as conscious sedation during continuous monitoring of the patient's level of consciousness and physiological / cardiorespiratory status by the radiology RN, with a total moderate sedation time of 15 minutes. Under real-time ultrasound guidance, the right IJ vein was accessed with a 21 gauge micropuncture needle; the needle tip within the vein was confirmed with ultrasound image documentation. Needle exchanged over the 018 guidewire for transitional dilator, which allowed advancement of a Benson wire into the IVC. Over this, an MPA catheter was advanced. A Palindrome 23 hemodialysis catheter was tunneled from the right anterior chest wall approach to the right IJ dermatotomy site. The MPA catheter was exchanged over an Amplatz wire for serial vascular dilators which allow placement of a peel-away sheath, through which the catheter was advanced under intermittent fluoroscopy, positioned with its tips in the proximal and midright atrium. Spot chest radiograph confirms good catheter position. No pneumothorax. Catheter was flushed and primed per protocol. Catheter secured externally with O Prolene sutures. The right IJ dermatotomy site was closed with Dermabond. COMPLICATIONS: COMPLICATIONS None immediate FLUOROSCOPY TIME:  18 seconds, 2 mGy COMPARISON:  None IMPRESSION: 1. Technically successful placement of tunneled right IJ hemodialysis catheter with ultrasound and fluoroscopic guidance. Ready for routine use. ACCESS: Remains approachable for percutaneous intervention as needed. Electronically Signed   By: Lucrezia Europe M.D.   On: 01/27/2016 16:18        Scheduled Meds: . acyclovir  400 mg Oral Daily  . amLODipine  10 mg Oral Daily  . aspirin EC  81 mg Oral Daily  . calcium acetate  667 mg Oral TID WC  . carvedilol  25 mg Oral BID WC  . cholecalciferol  3,000 Units Oral Daily  . darbepoetin (ARANESP) injection - NON-DIALYSIS  100 mcg Subcutaneous Q Thu-1800  .  fluticasone  1 spray Each Nare Daily  . furosemide  80 mg Intravenous Q8H  . gabapentin  300 mg Oral QHS  . heparin  5,000 Units Subcutaneous Q8H  . hydrALAZINE  100 mg Oral TID  . ixazomib citrate  3 mg Oral Q14 Days  . loratadine  10 mg Oral Daily  . metolazone  5 mg Oral BID  . sodium bicarbonate  650 mg Oral BID  . sodium chloride flush  3 mL Intravenous Q12H  . vitamin B-12  50 mcg Oral Daily   Continuous Infusions:   LOS: 5 days    Time spent: 30 minutes    Sophiah Rolin, MD [facility] Pager [phone]  If 7PM-7AM, please contact night-coverage [email or web address] Password [facility] 01/28/2016, 12:24 PM

## 2016-01-29 DIAGNOSIS — N185 Chronic kidney disease, stage 5: Secondary | ICD-10-CM

## 2016-01-29 LAB — RENAL FUNCTION PANEL
ANION GAP: 10 (ref 5–15)
Albumin: 3.5 g/dL (ref 3.5–5.0)
BUN: 39 mg/dL — ABNORMAL HIGH (ref 6–20)
CALCIUM: 8.6 mg/dL — AB (ref 8.9–10.3)
CHLORIDE: 102 mmol/L (ref 101–111)
CO2: 26 mmol/L (ref 22–32)
Creatinine, Ser: 3.56 mg/dL — ABNORMAL HIGH (ref 0.61–1.24)
GFR calc non Af Amer: 17 mL/min — ABNORMAL LOW (ref >60)
GFR, EST AFRICAN AMERICAN: 19 mL/min — AB (ref >60)
GLUCOSE: 108 mg/dL — AB (ref 65–99)
POTASSIUM: 3 mmol/L — AB (ref 3.5–5.1)
Phosphorus: 3.6 mg/dL (ref 2.5–4.6)
Sodium: 138 mmol/L (ref 135–145)

## 2016-01-29 LAB — CBC
HEMATOCRIT: 25.6 % — AB (ref 39.0–52.0)
HEMOGLOBIN: 8.2 g/dL — AB (ref 13.0–17.0)
MCH: 28.9 pg (ref 26.0–34.0)
MCHC: 32 g/dL (ref 30.0–36.0)
MCV: 90.1 fL (ref 78.0–100.0)
Platelets: 81 10*3/uL — ABNORMAL LOW (ref 150–400)
RBC: 2.84 MIL/uL — AB (ref 4.22–5.81)
RDW: 18.4 % — ABNORMAL HIGH (ref 11.5–15.5)
WBC: 4.9 10*3/uL (ref 4.0–10.5)

## 2016-01-29 MED ORDER — POTASSIUM CHLORIDE CRYS ER 20 MEQ PO TBCR: 40.0000 meq | EXTENDED_RELEASE_TABLET | Freq: Once | ORAL | Status: DC

## 2016-01-29 MED ORDER — SODIUM CHLORIDE 0.9 % IV SOLN
125.0000 mg | INTRAVENOUS | Status: DC
  Administered 2016-01-29: 125 mg via INTRAVENOUS
  Filled 2016-01-29 (×3): qty 10

## 2016-01-29 MED ORDER — TUBERCULIN PPD 5 UNIT/0.1ML ID SOLN
5.0000 [IU] | Freq: Once | INTRADERMAL | Status: AC
  Administered 2016-01-29: 5 [IU] via INTRADERMAL
  Filled 2016-01-29: qty 0.1

## 2016-01-29 NOTE — Progress Notes (Addendum)
[facility] Progress Note     Subjective:    HD #3 today.  Edema improving.     Objective:   BP (!) 117/55   Pulse 69   Temp 98.6 F (37 C) (Oral)   Resp 20   Ht 5\' 10"  (1.778 m)   Wt 98.6 kg (217 lb 6 oz)   SpO2 95%   BMI 31.19 kg/m   Intake/Output Summary (Last 24 hours) at 01/29/16 1426 Last data filed at 01/29/16 1358  Gross per 24 hour  Intake              663 ml  Output             2250 ml  Net            -1587 ml   Weight change: -5.4 kg (-11 lb 14.5 oz)  Physical Exam: Gen: sitting in bed, feeling well HEENT: EOMI, PERRL, MMM NECK: no JVD at 90 degrees CVS: Holosystolic murmur noted, RRR otherwise  Resp: CTAB, no increased WOB  Abd: less distended Ext: 2+ LE edema, can see ankle bones  Imaging: Ir Fluoro Guide Cv Line Right  Result Date: 01/27/2016 CLINICAL DATA:  Renal failure, needs IV access for hemodialysis. EXAM: TUNNELED HEMODIALYSIS CATHETER PLACEMENT WITH ULTRASOUND AND FLUOROSCOPIC GUIDANCE TECHNIQUE: The procedure, risks, benefits, and alternatives were explained to the patient. Questions regarding the procedure were encouraged and answered. The patient understands and consents to the procedure. As antibiotic prophylaxis, cefazolin 2 g was ordered pre-procedure and administered intravenously within one hour of incision.Patency of the right IJ vein was confirmed with ultrasound with image documentation. An appropriate skin site was determined. Region was prepped using maximum barrier technique including cap and mask, sterile gown, sterile gloves, large sterile sheet, and Chlorhexidine as cutaneous antisepsis. The region was infiltrated locally with 1% lidocaine. Intravenous Fentanyl and Versed were administered as conscious sedation during continuous monitoring of the patient's level of consciousness and physiological / cardiorespiratory status by the radiology RN, with a total moderate sedation time of 15 minutes. Under real-time  ultrasound guidance, the right IJ vein was accessed with a 21 gauge micropuncture needle; the needle tip within the vein was confirmed with ultrasound image documentation. Needle exchanged over the 018 guidewire for transitional dilator, which allowed advancement of a Benson wire into the IVC. Over this, an MPA catheter was advanced. A Palindrome 23 hemodialysis catheter was tunneled from the right anterior chest wall approach to the right IJ dermatotomy site. The MPA catheter was exchanged over an Amplatz wire for serial vascular dilators which allow placement of a peel-away sheath, through which the catheter was advanced under intermittent fluoroscopy, positioned with its tips in the proximal and midright atrium. Spot chest radiograph confirms good catheter position. No pneumothorax. Catheter was flushed and primed per protocol. Catheter secured externally with O Prolene sutures. The right IJ dermatotomy site was closed with Dermabond. COMPLICATIONS: COMPLICATIONS None immediate FLUOROSCOPY TIME:  18 seconds, 2 mGy COMPARISON:  None IMPRESSION: 1. Technically successful placement of tunneled right IJ hemodialysis catheter with ultrasound and fluoroscopic guidance. Ready for routine use. ACCESS: Remains approachable for percutaneous intervention as needed. Electronically Signed   By: Lucrezia Europe M.D.   On: 01/27/2016 16:18   Ir US Guide Vasc Access Right  Result Date: 01/27/2016 CLINICAL DATA:  Renal failure, needs IV access for hemodialysis. EXAM: TUNNELED HEMODIALYSIS CATHETER PLACEMENT WITH ULTRASOUND AND FLUOROSCOPIC GUIDANCE TECHNIQUE: The procedure, risks, benefits, and alternatives were explained to the patient.  Questions regarding the procedure were encouraged and answered. The patient understands and consents to the procedure. As antibiotic prophylaxis, cefazolin 2 g was ordered pre-procedure and administered intravenously within one hour of incision.Patency of the right IJ vein was confirmed with  ultrasound with image documentation. An appropriate skin site was determined. Region was prepped using maximum barrier technique including cap and mask, sterile gown, sterile gloves, large sterile sheet, and Chlorhexidine as cutaneous antisepsis. The region was infiltrated locally with 1% lidocaine. Intravenous Fentanyl and Versed were administered as conscious sedation during continuous monitoring of the patient's level of consciousness and physiological / cardiorespiratory status by the radiology RN, with a total moderate sedation time of 15 minutes. Under real-time ultrasound guidance, the right IJ vein was accessed with a 21 gauge micropuncture needle; the needle tip within the vein was confirmed with ultrasound image documentation. Needle exchanged over the 018 guidewire for transitional dilator, which allowed advancement of a Benson wire into the IVC. Over this, an MPA catheter was advanced. A Palindrome 23 hemodialysis catheter was tunneled from the right anterior chest wall approach to the right IJ dermatotomy site. The MPA catheter was exchanged over an Amplatz wire for serial vascular dilators which allow placement of a peel-away sheath, through which the catheter was advanced under intermittent fluoroscopy, positioned with its tips in the proximal and midright atrium. Spot chest radiograph confirms good catheter position. No pneumothorax. Catheter was flushed and primed per protocol. Catheter secured externally with O Prolene sutures. The right IJ dermatotomy site was closed with Dermabond. COMPLICATIONS: COMPLICATIONS None immediate FLUOROSCOPY TIME:  18 seconds, 2 mGy COMPARISON:  None IMPRESSION: 1. Technically successful placement of tunneled right IJ hemodialysis catheter with ultrasound and fluoroscopic guidance. Ready for routine use. ACCESS: Remains approachable for percutaneous intervention as needed. Electronically Signed   By: Lucrezia Europe M.D.   On: 01/27/2016 16:18    Labs: BMET  Recent  Labs Lab 01/23/16 1323 01/24/16 0423 01/25/16 0436 01/26/16 0335 01/27/16 0232 01/28/16 0404 01/29/16 0230  NA 140 142 141 140 138 140 138  K 4.2 4.1 4.3 3.7 3.6 3.4* 3.0*  CL 112* 113* 111 112* 107 105 102  CO2 16* 18* 18* 18* 18* 22 26  GLUCOSE 123* 106* 98 101* 107* 91 108*  BUN 89* 90* 91* 93* 97* 73* 39*  CREATININE 5.87* 5.95* 6.14* 6.02* 6.16* 5.03* 3.56*  CALCIUM 8.6* 8.5* 8.6* 8.5* 8.5* 8.5* 8.6*  PHOS  --   --  7.6* 7.6* 7.5* 6.1* 3.6   CBC  Recent Labs Lab 01/23/16 1323  01/26/16 0335 01/27/16 0232 01/28/16 0404 01/29/16 0230  WBC 6.8  < > 5.2 5.4 4.8 4.9  NEUTROABS 4.9  --   --   --   --   --   HGB 8.8*  < > 7.8* 7.7* 8.5* 8.2*  HCT 27.3*  < > 24.6* 23.6* 26.3* 25.6*  MCV 89.2  < > 90.1 89.4 89.8 90.1  PLT 110*  < > 91* 87* 101* 81*  < > = values in this interval not displayed.  Medications:    . acyclovir  400 mg Oral Daily  . amLODipine  10 mg Oral Daily  . aspirin EC  81 mg Oral Daily  . calcium acetate  667 mg Oral TID WC  . carvedilol  25 mg Oral BID WC  . cholecalciferol  3,000 Units Oral Daily  . darbepoetin (ARANESP) injection - NON-DIALYSIS  100 mcg Subcutaneous Q Thu-1800  . fluticasone  1 spray  Each Nare Daily  . gabapentin  300 mg Oral QHS  . heparin  5,000 Units Subcutaneous Q8H  . hydrALAZINE  100 mg Oral TID  . ixazomib citrate  3 mg Oral Q14 Days  . loratadine  10 mg Oral Daily  . sodium bicarbonate  650 mg Oral BID  . sodium chloride flush  3 mL Intravenous Q12H  . [START ON 01/30/2016] torsemide  80 mg Oral Daily  . vitamin B-12  50 mcg Oral Daily   Assessment/ Plan:   Background: 65 year old gentleman with history of amyloidosis, hypertension, CKD stage 5, chronic diastolic heart failure presenting with acute volume overload. Patient with rising serum creatinine over the last 24 hours as well as baseline serum creatinine has been generally rising over the past several months.   1. Progressive CKD (CKD stage 5)--> ESRD:  Nephrologist Dr. [name]. Patient presenting with likely cardiorenal syndrome in the setting of amyloidosis and HTN. Baseline SCr 3.0, however rising over the past several months consistent with progressive kidney dysfunction.  Last renal ultrasound in 2016 showed kidneys showed 12.9 cm/11.6 cm respectively right and left. Patient with a fistula placed on 12/27/2015, and will need maturation before future use.  - converting Lasix to torsemide to use on non-dialysis days--> 80 mg PO torsemide daily (don't think he will need as much diuretic as he's currently getting once we achieve euvolemia) - HD #1 11/17 - PPD ordered 11/19 - Hep B/C neg 11/17, needs hep B vaccine series - CLIP  2. Anemia: Hgb 8.2. Currently on Procrit 20,000 units every two weeks. Last obtained on 10/20 Iron 65 and saturation of 24. Giving Aranesp 100 q week here. (start 11/16).  Rechecking iron studies--> % sat is 16, starting ferrlicit with dialysis 50/93  3.Metabolic Bone disease: PTH elevated at 213 10/20. Currently on cholecalciferol 3,000 units. Phosphorus level 7.6. Calcium acetate 667 with meals, renal diet  4. Acute on chronic diastolic heart failure - September 2017 LVEF is 65-70% and grade 2 diastolic dysfunction, severe concentric hypertrophy, PA pressure normal. -  Currently on Coreg, hydralazine  5. AL Amyloidosis - on ixazomib citrate   6.  Nutrition: alb 3.6.  Have talked about increased protein intake.  Madelon Lips MD [facility] Cell [phone] pgr [phone] 01/29/2016, 2:26 PM

## 2016-01-29 NOTE — Procedures (Signed)
Patient seen and examined on Hemodialysis. QB 300 UF goal 2 liters.  Tolerating HD well. Treatment adjusted as needed.  Madelon Lips MD [facility]  Cell [phone] Pgr: [phone] 2:29 PM

## 2016-01-29 NOTE — Progress Notes (Addendum)
PROGRESS NOTE    Jose Garrett  TDV:[id number] DOB: Dec 17, 1950 DOA: 01/23/2016 PCP: [name], MD   Brief Narrative:  Jose Garrett a 65 y.o.malewith medical history significant of AL Amyloidosis, CKD stage 4-5. Patient follows with Dr. [name] for his CKD, and a right forearm fistula created on 12/27/2015 for anticipation of dialysis. Patient reported for the several weeks lower extremity edema was worsening but for the past 10 days got really worse, he has scrotal edema, has DOE and PND and orthopnea. His Lasix was increased recently from 40 to 80 mg by Dr. [name] but he continues to have worsening of his fluid status so he came into the ED for further management.    Assessment & Plan:   Principal Problem:   Chronic kidney disease with dialysis modality undecided, stage 5 ([place]): Progressive Active Problems:   DOE (dyspnea on exertion)   AL amyloidosis   Acute on chronic diastolic CHF (congestive heart failure) (HCC)   Fluid overload, unspecified   Acute pulmonary edema (HCC)   Acute renal failure superimposed on chronic kidney disease (HCC)   Hypervolemia   ESRD (end stage renal disease) ([place])  Acute pulmonary edema -Patient presented with SOB, DOE, orthopnea and massive lower extremity edema edema. -CXR showed bilateral effusion and interstitial edema, BNP is slightly elevated at 130. -This is secondary to progressive stage Vchronic kidney disease, with no significant improvement despite diuresis.  -Started on IV diuresis and metolazone with a urine output however no significant change in renal function. IV Lasix and metolazone have been changed to oral Demadex. Patient has been started on hemodialysis.  - Patient with renal function that seems to be plateauing with increased IV Lasix and addition of metolazone. Tunneled HD catheter was placed and patient underwent hemodialysis yesterday 01/27/2016 with some clinical improvement. Patient for possible  hemodialysis today. Per nephrology.  Progressive chronic kidney disease stage V >>> ESRD  -Patient has stage V CKD secondary nephrotic range disease from to biopsy-proven AL amyloidosis with likely progressive CKD V>>ESRD -Kidney biopsy done on 09/08/2014 showed AL amyloid. Patient was on 40 mg of Lasix twice a day. -Recently appears to be worsening renal function, right forearm fistula created on 12/27/2015. -Lasix and metolazone have been changed to oral Demadex per nephrology.  Due to no significant change with renal function tunneled HD catheter was placed per interventional radiology and patient undergoing hemodialysis, 1st one 01/27/2016 per nephrology. Patient for probable hemodialysis today.  Nephrology following and appreciate input and recommendations.  Acute on chronic diastolic CHF -On last echo done in September 2017 LVEF is 60-65% and grade 2 diastolic dysfunction. -Acute pulmonary edema likely secondary to fluid overload from worsening of his kidney disease. -This is now manifests as acute on chronic diastolic CHF, this will improve as the fluid overload improves. -Continue home medication including Coreg and hydralazine. - Patient was on IV Lasix and metolazone, has been transitioned to [facility].  Due to patient's fluctuating/plateauing renal function tunneled HD catheter was placed and patient undergoing hemodialysis with clinical improvement. Patient with a urine output of 250cc over the past 24 hours and 2 L removed during hemodialysis. Patient is - 7113 mL during this hospitalization. Patient with likely progressive chronic kidney disease stage V. Patient underwent hemodialysis yesterday and likely for hemodialysis today. Per nephrology.  AL amyloidosis -With renal involvement, patient was following with Dr. [name]. -Is on ixazomib citrate, was on dexamethasone as well for that. Resumed acyclovir.  DOE -The massive lower extremity edema,  DOE normal from the fluid  overload. - Slowly clinically improving however still with significant volume overload. Patient on metolazone and high-dose IV Lasix. Renal function seems to be stabilizing and creatinine currently at 3.56 from 5.03 from 6.16 from 6.02 from 6.14 yesterday. Patient with a urine output of 250cc over the past 24 hours. Patient during hemodialysis had 2 liter removed. Patient is - 7.113 L during this hospitalization. Renal ff.  Hypokalemia Secondary to diureses. Patient for hemodialysis today. Will defer replacement to nephrology.     DVT prophylaxis: Heparin Code Status: Full Family Communication: Updated patient. No family at bedside. Disposition Plan: Home once volume overload has resolved and per nephrology.   Consultants:   Nephrology: 01/24/2016  Procedures:   Chest x-ray 01/23/2016  Tunneled HD catheter per Dr. [name] 01/27/2016  Antimicrobials:   None   Subjective: Patient states improvement with shortness of breath after hemodialysis. Patient with urine output of 0.250 L over the past 24 hours. 2L removed. Patient is -7.1 L during his hospitalization. No chest pain.   Objective: Vitals:   01/28/16 2332 01/29/16 0521 01/29/16 0834 01/29/16 1154  BP: (!) 145/49 133/79 (!) 138/56 (!) 126/49  Pulse: 73 71 68 67  Resp: 18 20 18 20   Temp: 99.2 F (37.3 C) 99 F (37.2 C) 98 F (36.7 C) 98.7 F (37.1 C)  TempSrc: Oral Oral Oral Oral  SpO2: 95% 95% 98% 95%  Weight:  97.8 kg (215 lb 8 oz)    Height:        Intake/Output Summary (Last 24 hours) at 01/29/16 1307 Last data filed at 01/29/16 1000  Gross per 24 hour  Intake              723 ml  Output             2250 ml  Net            -1527 ml   Filed Weights   01/28/16 1925 01/28/16 2225 01/29/16 0521  Weight: 100.9 kg (222 lb 7.1 oz) 98.9 kg (218 lb 0.6 oz) 97.8 kg (215 lb 8 oz)    Examination:  General exam: Appears calm and comfortable  Respiratory system: Some scattered crackles.  Cardiovascular  system: S1 & S2 heard, RRR. + JVD, murmurs, rubs, gallops or clicks. 1- 2+ bilateral lower extremity edema. Scrotal edema.  Gastrointestinal system: Abdomen is nondistended, soft and nontender. No organomegaly or masses felt. Normal bowel sounds heard. Central nervous system: Alert and oriented. No focal neurological deficits. Extremities: Symmetric 5 x 5 power. Skin: No rashes, lesions or ulcers Psychiatry: Judgement and insight appear normal. Mood & affect appropriate.     Data Reviewed: I have personally reviewed following labs and imaging studies  CBC:  Recent Labs Lab 01/23/16 1323  01/25/16 0436 01/26/16 0335 01/27/16 0232 01/28/16 0404 01/29/16 0230  WBC 6.8  < > 5.8 5.2 5.4 4.8 4.9  NEUTROABS 4.9  --   --   --   --   --   --   HGB 8.8*  < > 8.1* 7.8* 7.7* 8.5* 8.2*  HCT 27.3*  < > 25.7* 24.6* 23.6* 26.3* 25.6*  MCV 89.2  < > 91.5 90.1 89.4 89.8 90.1  PLT 110*  < > 103* 91* 87* 101* 81*  < > = values in this interval not displayed. Basic Metabolic Panel:  Recent Labs Lab 01/25/16 0436 01/26/16 0335 01/27/16 0232 01/28/16 0404 01/29/16 0230  NA 141 140 138 140 138  K  4.3 3.7 3.6 3.4* 3.0*  CL 111 112* 107 105 102  CO2 18* 18* 18* 22 26  GLUCOSE 98 101* 107* 91 108*  BUN 91* 93* 97* 73* 39*  CREATININE 6.14* 6.02* 6.16* 5.03* 3.56*  CALCIUM 8.6* 8.5* 8.5* 8.5* 8.6*  PHOS 7.6* 7.6* 7.5* 6.1* 3.6   GFR: Estimated Creatinine Clearance: 24.9 mL/min (by C-G formula based on SCr of 3.56 mg/dL (H)). Liver Function Tests:  Recent Labs Lab 01/23/16 1323 01/26/16 0335 01/27/16 0232 01/28/16 0404 01/29/16 0230  AST 22  --   --   --   --   ALT 19  --   --   --   --   ALKPHOS 61  --   --   --   --   BILITOT 0.4  --   --   --   --   PROT 6.2*  --   --   --   --   ALBUMIN 3.9 3.5 3.6 3.7 3.5   No results for input(s): LIPASE, AMYLASE in the last 168 hours. No results for input(s): AMMONIA in the last 168 hours. Coagulation Profile:  Recent Labs Lab  01/23/16 2305  INR 1.27   Cardiac Enzymes: No results for input(s): CKTOTAL, CKMB, CKMBINDEX, TROPONINI in the last 168 hours. BNP (last 3 results) No results for input(s): PROBNP in the last 8760 hours. HbA1C: No results for input(s): HGBA1C in the last 72 hours. CBG: No results for input(s): GLUCAP in the last 168 hours. Lipid Profile: No results for input(s): CHOL, HDL, LDLCALC, TRIG, CHOLHDL, LDLDIRECT in the last 72 hours. Thyroid Function Tests: No results for input(s): TSH, T4TOTAL, FREET4, T3FREE, THYROIDAB in the last 72 hours. Anemia Panel:  Recent Labs  01/28/16 1402  TIBC 280  IRON 45   Sepsis Labs: No results for input(s): PROCALCITON, LATICACIDVEN in the last 168 hours.  No results found for this or any previous visit (from the past 240 hour(s)).       Radiology Studies: Ir Fluoro Guide Cv Line Right  Result Date: 01/27/2016 CLINICAL DATA:  Renal failure, needs IV access for hemodialysis. EXAM: TUNNELED HEMODIALYSIS CATHETER PLACEMENT WITH ULTRASOUND AND FLUOROSCOPIC GUIDANCE TECHNIQUE: The procedure, risks, benefits, and alternatives were explained to the patient. Questions regarding the procedure were encouraged and answered. The patient understands and consents to the procedure. As antibiotic prophylaxis, cefazolin 2 g was ordered pre-procedure and administered intravenously within one hour of incision.Patency of the right IJ vein was confirmed with ultrasound with image documentation. An appropriate skin site was determined. Region was prepped using maximum barrier technique including cap and mask, sterile gown, sterile gloves, large sterile sheet, and Chlorhexidine as cutaneous antisepsis. The region was infiltrated locally with 1% lidocaine. Intravenous Fentanyl and Versed were administered as conscious sedation during continuous monitoring of the patient's level of consciousness and physiological / cardiorespiratory status by the radiology RN, with a total  moderate sedation time of 15 minutes. Under real-time ultrasound guidance, the right IJ vein was accessed with a 21 gauge micropuncture needle; the needle tip within the vein was confirmed with ultrasound image documentation. Needle exchanged over the 018 guidewire for transitional dilator, which allowed advancement of a Benson wire into the IVC. Over this, an MPA catheter was advanced. A Palindrome 23 hemodialysis catheter was tunneled from the right anterior chest wall approach to the right IJ dermatotomy site. The MPA catheter was exchanged over an Amplatz wire for serial vascular dilators which allow placement of a peel-away sheath,  through which the catheter was advanced under intermittent fluoroscopy, positioned with its tips in the proximal and midright atrium. Spot chest radiograph confirms good catheter position. No pneumothorax. Catheter was flushed and primed per protocol. Catheter secured externally with O Prolene sutures. The right IJ dermatotomy site was closed with Dermabond. COMPLICATIONS: COMPLICATIONS None immediate FLUOROSCOPY TIME:  18 seconds, 2 mGy COMPARISON:  None IMPRESSION: 1. Technically successful placement of tunneled right IJ hemodialysis catheter with ultrasound and fluoroscopic guidance. Ready for routine use. ACCESS: Remains approachable for percutaneous intervention as needed. Electronically Signed   By: Lucrezia Europe M.D.   On: 01/27/2016 16:18   Ir US Guide Vasc Access Right  Result Date: 01/27/2016 CLINICAL DATA:  Renal failure, needs IV access for hemodialysis. EXAM: TUNNELED HEMODIALYSIS CATHETER PLACEMENT WITH ULTRASOUND AND FLUOROSCOPIC GUIDANCE TECHNIQUE: The procedure, risks, benefits, and alternatives were explained to the patient. Questions regarding the procedure were encouraged and answered. The patient understands and consents to the procedure. As antibiotic prophylaxis, cefazolin 2 g was ordered pre-procedure and administered intravenously within one hour of  incision.Patency of the right IJ vein was confirmed with ultrasound with image documentation. An appropriate skin site was determined. Region was prepped using maximum barrier technique including cap and mask, sterile gown, sterile gloves, large sterile sheet, and Chlorhexidine as cutaneous antisepsis. The region was infiltrated locally with 1% lidocaine. Intravenous Fentanyl and Versed were administered as conscious sedation during continuous monitoring of the patient's level of consciousness and physiological / cardiorespiratory status by the radiology RN, with a total moderate sedation time of 15 minutes. Under real-time ultrasound guidance, the right IJ vein was accessed with a 21 gauge micropuncture needle; the needle tip within the vein was confirmed with ultrasound image documentation. Needle exchanged over the 018 guidewire for transitional dilator, which allowed advancement of a Benson wire into the IVC. Over this, an MPA catheter was advanced. A Palindrome 23 hemodialysis catheter was tunneled from the right anterior chest wall approach to the right IJ dermatotomy site. The MPA catheter was exchanged over an Amplatz wire for serial vascular dilators which allow placement of a peel-away sheath, through which the catheter was advanced under intermittent fluoroscopy, positioned with its tips in the proximal and midright atrium. Spot chest radiograph confirms good catheter position. No pneumothorax. Catheter was flushed and primed per protocol. Catheter secured externally with O Prolene sutures. The right IJ dermatotomy site was closed with Dermabond. COMPLICATIONS: COMPLICATIONS None immediate FLUOROSCOPY TIME:  18 seconds, 2 mGy COMPARISON:  None IMPRESSION: 1. Technically successful placement of tunneled right IJ hemodialysis catheter with ultrasound and fluoroscopic guidance. Ready for routine use. ACCESS: Remains approachable for percutaneous intervention as needed. Electronically Signed   By: Lucrezia Europe  M.D.   On: 01/27/2016 16:18        Scheduled Meds: . acyclovir  400 mg Oral Daily  . amLODipine  10 mg Oral Daily  . aspirin EC  81 mg Oral Daily  . calcium acetate  667 mg Oral TID WC  . carvedilol  25 mg Oral BID WC  . cholecalciferol  3,000 Units Oral Daily  . darbepoetin (ARANESP) injection - NON-DIALYSIS  100 mcg Subcutaneous Q Thu-1800  . fluticasone  1 spray Each Nare Daily  . gabapentin  300 mg Oral QHS  . heparin  5,000 Units Subcutaneous Q8H  . hydrALAZINE  100 mg Oral TID  . ixazomib citrate  3 mg Oral Q14 Days  . loratadine  10 mg Oral Daily  . potassium chloride  40 mEq Oral Once  . sodium bicarbonate  650 mg Oral BID  . sodium chloride flush  3 mL Intravenous Q12H  . [START ON 01/30/2016] torsemide  80 mg Oral Daily  . vitamin B-12  50 mcg Oral Daily   Continuous Infusions:   LOS: 6 days    Time spent: 30 minutes    Emett Stapel, MD [facility] Pager [phone]  If 7PM-7AM, please contact night-coverage [email or web address] Password [email or web address] 01/29/2016, 1:07 PM

## 2016-01-30 ENCOUNTER — Inpatient Hospital Stay (HOSPITAL_COMMUNITY): Payer: Self-pay

## 2016-01-30 LAB — RENAL FUNCTION PANEL
Albumin: 3.3 g/dL — ABNORMAL LOW (ref 3.5–5.0)
Anion gap: 9 (ref 5–15)
BUN: 16 mg/dL (ref 6–20)
CALCIUM: 8.3 mg/dL — AB (ref 8.9–10.3)
CO2: 28 mmol/L (ref 22–32)
CREATININE: 2.66 mg/dL — AB (ref 0.61–1.24)
Chloride: 97 mmol/L — ABNORMAL LOW (ref 101–111)
GFR, EST AFRICAN AMERICAN: 28 mL/min — AB (ref >60)
GFR, EST NON AFRICAN AMERICAN: 24 mL/min — AB (ref >60)
Glucose, Bld: 96 mg/dL (ref 65–99)
Phosphorus: 3.5 mg/dL (ref 2.5–4.6)
Potassium: 3.1 mmol/L — ABNORMAL LOW (ref 3.5–5.1)
SODIUM: 134 mmol/L — AB (ref 135–145)

## 2016-01-30 MED ORDER — POTASSIUM CHLORIDE CRYS ER 20 MEQ PO TBCR
40.0000 meq | EXTENDED_RELEASE_TABLET | Freq: Once | ORAL | Status: AC
  Administered 2016-01-30: 40 meq via ORAL
  Filled 2016-01-30: qty 2

## 2016-01-30 NOTE — Progress Notes (Signed)
Patient scheduled for chest xray.  Contacted radiology department and confirmed he is on list for exam this afternoon.

## 2016-01-30 NOTE — Plan of Care (Signed)
Problem: Fluid Volume: Goal: Ability to maintain a balanced intake and output will improve Outcome: Completed/Met Date Met: 01/30/16 Receiving hemodialysis for 3rd day in a row today

## 2016-01-30 NOTE — Progress Notes (Signed)
Patient felt warm.  Retook temperature and it was 101.4 orally.  1st temp this morning was after drinking cold water with medications. Medicated with prn Tylenol 650mg  po, as already ordered. Dr. Grandville Silos informed via text page.  02 Sat retaken after ambulation and on room air and it was 91 %.  Dr. Grandville Silos informed of this update.

## 2016-01-30 NOTE — Progress Notes (Signed)
02 Sat = 75% on room air at rest.  02 titrated up to 4L per nasal cannula before Sat increased to 90%.  Dr. Grandville Silos informed via text page with above details.

## 2016-01-30 NOTE — Progress Notes (Signed)
Pt refused bed alarm on. Will continue hourly rounding. 

## 2016-01-30 NOTE — Progress Notes (Signed)
PROGRESS NOTE    ACESON LABELL  DVV:[id number] DOB: Oct 14, 1950 DOA: 01/23/2016 PCP: [name], MD   Brief Narrative:  Jose Garrett Jose Garrett a 65 y.o.malewith medical history significant of AL Amyloidosis, CKD stage 4-5. Patient follows with Dr. Moshe Garrett for his CKD, and a right forearm fistula created on 12/27/2015 for anticipation of dialysis. Patient reported for the several weeks lower extremity edema was worsening but for the past 10 days got really worse, he has scrotal edema, has DOE and PND and orthopnea. His Lasix was increased recently from 40 to 80 mg by Dr. Moshe Garrett but he continues to have worsening of his fluid status so he came into the ED for further management.    Assessment & Plan:   Principal Problem:   Chronic kidney disease with dialysis modality undecided, stage 5 [facility]): Progressive Active Problems:   DOE (dyspnea on exertion)   AL amyloidosis   Acute on chronic diastolic CHF (congestive heart failure) (HCC)   Fluid overload, unspecified   Acute pulmonary edema (HCC)   Acute renal failure superimposed on chronic kidney disease (HCC)   Hypervolemia   ESRD (end stage renal disease) (HCC)   CKD (chronic kidney disease) stage 5, GFR less than 15 ml/min (HCC)  Acute pulmonary edema -Patient presented with SOB, DOE, orthopnea and massive lower extremity edema edema. -CXR showed bilateral effusion and interstitial edema, BNP is slightly elevated at 130. -This is secondary to progressive stage Vchronic kidney disease, with no significant improvement despite diuresis.  -Started on IV diuresis and metolazone with a urine output however no significant change in renal function. IV Lasix and metolazone have been changed to oral Demadex. Patient has been started on hemodialysis.  - Patient with renal function that seems to be trending down with the initiation of hemodialysis. Tunneled HD catheter was placed and patient undergoing hemodialysis, with first  treatment on 01/27/2016 with clinical improvement. Patient for hemodialysis today. Per nephrology.  Progressive chronic kidney disease stage V >>> ESRD  -Patient has stage V CKD secondary nephrotic range disease from to biopsy-proven AL amyloidosis with likely progressive CKD V>>ESRD -Kidney biopsy done on 09/08/2014 showed AL amyloid. Patient was on 40 mg of Lasix twice a day. -Recently appears to be worsening renal function, right forearm fistula created on 12/27/2015. -Lasix and metolazone have been changed to oral Demadex per nephrology.  Due to no significant change with renal function tunneled HD catheter was placed per interventional radiology and patient undergoing hemodialysis, 1st one 01/27/2016 per nephrology. Patient for hemodialysis today.  Nephrology following and appreciate input and recommendations.  Acute on chronic diastolic CHF -On last echo done in September 2017 LVEF is 60-65% and grade 2 diastolic dysfunction. -Acute pulmonary edema likely secondary to fluid overload from progressive chronic kidney disease. Patient now receiving hemodialysis treatments with clinical improvement. -This is now manifests as acute on chronic diastolic CHF. -Continue home medication including Coreg and hydralazine. - Patient was on IV Lasix and metolazone, has been transitioned to [facility] while on hemodialysis.  Due to patient's fluctuating/plateauing renal function tunneled HD catheter was placed and patient undergoing hemodialysis with clinical improvement. Patient with 2 L removed during hemodialysis. Patient is - 12,643 mL during this hospitalization. Patient with likely progressive chronic kidney disease stage V. Patient underwent hemodialysis yesterday and for hemodialysis today. Per nephrology.  AL amyloidosis -With renal involvement, patient was following with Dr. Irene Garrett. -Is on ixazomib citrate, was on dexamethasone as well for that. Resumed acyclovir.  DOE -The massive lower  extremity edema, DOE normal from the fluid overload. - Slowly clinically improving. Patient was on metolazone and high-dose IV Lasix. Renal function seems to be trending down and improving since being started on hemodialysis. Clinical improvement. Creatinine currently at 2.66 from 3.56 from 5.03 from 6.16 from 6.02 from 6.14. Patient during hemodialysis had 2 liter removed. Patient is - 12.643 L during this hospitalization. Renal ff.  Hypokalemia Secondary to diureses. Patient for hemodialysis today. K deal 40 mEq by mouth 1.  Fever Questionable etiology. Check a chest x-ray. Check blood cultures 2. Check a UA with cultures and sensitivities. Monitor closely.     DVT prophylaxis: Heparin Code Status: Full Family Communication: Updated patient. No family at bedside. Disposition Plan: Home once volume overload has resolved and per nephrology.   Consultants:   Nephrology: 01/24/2016  Procedures:   Chest x-ray 01/23/2016  Tunneled HD catheter per Dr. Vernard Garrett 01/27/2016  Antimicrobials:   None   Subjective: Patient in hemodialysis. No chest pain. States shortness of breath is improving. Per nursing patient noted to be hypoxic this morning. Patient also noted to have a fever.   Objective: Vitals:   01/30/16 1500 01/30/16 1526 01/30/16 1552 01/30/16 1628  BP: (!) 123/56 (!) 126/50 (!) 121/57 (!) 122/57  Pulse: 65 68 67 67  Resp: 16 16 14 17   Temp: 97.1 F (36.2 C)   97.2 F (36.2 C)  TempSrc: Oral   Oral  SpO2:  96% 97% 98%  Weight:    97.9 kg (215 lb 13.3 oz)  Height:        Intake/Output Summary (Last 24 hours) at 01/30/16 1657 Last data filed at 01/30/16 1628  Gross per 24 hour  Intake              470 ml  Output             6000 ml  Net            -5530 ml   Filed Weights   01/30/16 0510 01/30/16 1210 01/30/16 1628  Weight: 95.7 kg (211 lb) 95.9 kg (211 lb 6.7 oz) 97.9 kg (215 lb 13.3 oz)    Examination:  General exam: Appears calm and comfortable    Respiratory system: Some scattered crackles In bases. Cardiovascular system: S1 & S2 heard, RRR. + JVD, murmurs, rubs, gallops or clicks. 1- 2+ bilateral lower extremity edema. Scrotal edema.  Gastrointestinal system: Abdomen is nondistended, soft and nontender. No organomegaly or masses felt. Normal bowel sounds heard. Central nervous system: Alert and oriented. No focal neurological deficits. Extremities: Symmetric 5 x 5 power. Skin: No rashes, lesions or ulcers Psychiatry: Judgement and insight appear normal. Mood & affect appropriate.     Data Reviewed: I have personally reviewed following labs and imaging studies  CBC:  Recent Labs Lab 01/25/16 0436 01/26/16 0335 01/27/16 0232 01/28/16 0404 01/29/16 0230  WBC 5.8 5.2 5.4 4.8 4.9  HGB 8.1* 7.8* 7.7* 8.5* 8.2*  HCT 25.7* 24.6* 23.6* 26.3* 25.6*  MCV 91.5 90.1 89.4 89.8 90.1  PLT 103* 91* 87* 101* 81*   Basic Metabolic Panel:  Recent Labs Lab 01/26/16 0335 01/27/16 0232 01/28/16 0404 01/29/16 0230 01/30/16 0312  NA 140 138 140 138 134*  K 3.7 3.6 3.4* 3.0* 3.1*  CL 112* 107 105 102 97*  CO2 18* 18* 22 26 28   GLUCOSE 101* 107* 91 108* 96  BUN 93* 97* 73* 39* 16  CREATININE 6.02* 6.16* 5.03* 3.56* 2.66*  CALCIUM 8.5* 8.5* 8.5*  8.6* 8.3*  PHOS 7.6* 7.5* 6.1* 3.6 3.5   GFR: Estimated Creatinine Clearance: 33.4 mL/min (by C-G formula based on SCr of 2.66 mg/dL (H)). Liver Function Tests:  Recent Labs Lab 01/26/16 0335 01/27/16 0232 01/28/16 0404 01/29/16 0230 01/30/16 0312  ALBUMIN 3.5 3.6 3.7 3.5 3.3*   No results for input(s): LIPASE, AMYLASE in the last 168 hours. No results for input(s): AMMONIA in the last 168 hours. Coagulation Profile:  Recent Labs Lab 01/23/16 2305  INR 1.27   Cardiac Enzymes: No results for input(s): CKTOTAL, CKMB, CKMBINDEX, TROPONINI in the last 168 hours. BNP (last 3 results) No results for input(s): PROBNP in the last 8760 hours. HbA1C: No results for input(s):  HGBA1C in the last 72 hours. CBG: No results for input(s): GLUCAP in the last 168 hours. Lipid Profile: No results for input(s): CHOL, HDL, LDLCALC, TRIG, CHOLHDL, LDLDIRECT in the last 72 hours. Thyroid Function Tests: No results for input(s): TSH, T4TOTAL, FREET4, T3FREE, THYROIDAB in the last 72 hours. Anemia Panel:  Recent Labs  01/28/16 1402  TIBC 280  IRON 45   Sepsis Labs: No results for input(s): PROCALCITON, LATICACIDVEN in the last 168 hours.  No results found for this or any previous visit (from the past 240 hour(s)).       Radiology Studies: No results found.      Scheduled Meds: . acyclovir  400 mg Oral Daily  . amLODipine  10 mg Oral Daily  . aspirin EC  81 mg Oral Daily  . calcium acetate  667 mg Oral TID WC  . carvedilol  25 mg Oral BID WC  . cholecalciferol  3,000 Units Oral Daily  . darbepoetin (ARANESP) injection - NON-DIALYSIS  100 mcg Subcutaneous Q Thu-1800  . ferric gluconate (FERRLECIT/NULECIT) IV  125 mg Intravenous Q T,Th,Sa-HD  . fluticasone  1 spray Each Nare Daily  . gabapentin  300 mg Oral QHS  . heparin  5,000 Units Subcutaneous Q8H  . hydrALAZINE  100 mg Oral TID  . ixazomib citrate  3 mg Oral Q14 Days  . loratadine  10 mg Oral Daily  . sodium chloride flush  3 mL Intravenous Q12H  . tuberculin  5 Units Intradermal Once  . vitamin B-12  50 mcg Oral Daily   Continuous Infusions:   LOS: 7 days    Time spent: 30 minutes    THOMPSON,DANIEL, MD [facility] Pager [phone]  If 7PM-7AM, please contact night-coverage [email or web address] Password [facility] 01/30/2016, 4:57 PM

## 2016-01-30 NOTE — Progress Notes (Signed)
Dialysis called and [facility] nurse informed Blood cultures and chest xray ordered.  She stated they will obtain blood cultures if possible and chest xray can be completed their if needed

## 2016-01-30 NOTE — Progress Notes (Addendum)
Returned to room 3E08 from dialysis treatment.  Dialysis nurse stated they removed 4 Liters during treatment and that 02 sat was low with 1st vital signs and 02 applied at 2L/nasal cannula.  02 sat was 998% on room air at end of treatment  Clarification and late entry:  02 sat 98% - error of documenting 998%.

## 2016-01-30 NOTE — Progress Notes (Signed)
Report given to dialysis staff and she stated they will be coming to transport him to inpatient dialysis for treatment.  She was informed of elevated temp this morning and him getting Tylenol prn.

## 2016-01-30 NOTE — Progress Notes (Signed)
Assessment/ Plan:   Background: 65 year old gentleman with history of amyloidosis, hypertension, CKD stage 5, chronic diastolic heart failure presenting with acute volume overload. Patient with rising serum creatinine over the last 24 hours as well asbaseline serum creatinine hasbeen generally rising over the past several months.   1. Progressive CKD (CKD stage 5)--> ESRD: Nephrologist Dr. [name]. Patient presenting with likely cardiorenal syndrome in the setting of amyloidosis and HTN. Baseline SCr 3.0, however rising over the past several months consistent with progressive kidney dysfunction. Last renal ultrasound in 2016 showed kidneys showed 12.9 cm/11.6 cm respectively right and left. Patient with a fistula placed on 12/27/2015, and will need maturation before future use. - PPD ordered 11/19 - Hep B/C neg 11/17, needs hep B vaccine series - CLIP  2. Anemia: Hgb 8.2.  Giving Aranesp 100 q week here.   3.Metabolic Bone disease:PTH elevated at 213 10/20. Currently on cholecalciferol 3,000 units. Phosphorus level 7.6. Calcium acetate 667 with meals, renal diet  4. AL Amyloidosis - on ixazomib citrate   5 Volume overload today needs aggressive dialysis--do today  Subjective: Interval History: Low o2 sats  Objective: Vital signs in last 24 hours: Temp:  [98.2 F (36.8 C)-98.7 F (37.1 C)] 98.2 F (36.8 C) (11/20 0849) Pulse Rate:  [63-80] 80 (11/20 0848) Resp:  [16-63] 16 (11/20 0848) BP: (115-152)/(49-64) 150/59 (11/20 0848) SpO2:  [75 %-95 %] 93 % (11/20 0849) Weight:  [95.7 kg (211 lb)-98.6 kg (217 lb 6 oz)] 95.7 kg (211 lb) (11/20 0510) Weight change: -2.3 kg (-5 lb 1.1 oz)  Intake/Output from previous day: 11/19 0701 - 11/20 0700 In: 893 [P.O.:780; I.V.:3; IV Piggyback:110] Out: 2000  Intake/Output this shift: No intake/output data recorded.  Resp: diminished breath sounds bibasilar Extremities: edema 2-3+  Lab Results:  Recent Labs  01/28/16 0404  01/29/16 0230  WBC 4.8 4.9  HGB 8.5* 8.2*  HCT 26.3* 25.6*  PLT 101* 81*   BMET:  Recent Labs  01/29/16 0230 01/30/16 0312  NA 138 134*  K 3.0* 3.1*  CL 102 97*  CO2 26 28  GLUCOSE 108* 96  BUN 39* 16  CREATININE 3.56* 2.66*  CALCIUM 8.6* 8.3*   No results for input(s): PTH in the last 72 hours. Iron Studies:  Recent Labs  01/28/16 1402  IRON 45  TIBC 280   Studies/Results: No results found.  Scheduled: . acyclovir  400 mg Oral Daily  . amLODipine  10 mg Oral Daily  . aspirin EC  81 mg Oral Daily  . calcium acetate  667 mg Oral TID WC  . carvedilol  25 mg Oral BID WC  . cholecalciferol  3,000 Units Oral Daily  . darbepoetin (ARANESP) injection - NON-DIALYSIS  100 mcg Subcutaneous Q Thu-1800  . ferric gluconate (FERRLECIT/NULECIT) IV  125 mg Intravenous Q T,Th,Sa-HD  . fluticasone  1 spray Each Nare Daily  . gabapentin  300 mg Oral QHS  . heparin  5,000 Units Subcutaneous Q8H  . hydrALAZINE  100 mg Oral TID  . ixazomib citrate  3 mg Oral Q14 Days  . loratadine  10 mg Oral Daily  . sodium bicarbonate  650 mg Oral BID  . sodium chloride flush  3 mL Intravenous Q12H  . tuberculin  5 Units Intradermal Once  . vitamin B-12  50 mcg Oral Daily     LOS: 7 days   Lacrisha Bielicki C 01/30/2016,9:38 AM

## 2016-01-31 DIAGNOSIS — R509 Fever, unspecified: Secondary | ICD-10-CM

## 2016-01-31 LAB — CBC
HEMATOCRIT: 28 % — AB (ref 39.0–52.0)
HEMOGLOBIN: 8.6 g/dL — AB (ref 13.0–17.0)
MCH: 29 pg (ref 26.0–34.0)
MCHC: 30.7 g/dL (ref 30.0–36.0)
MCV: 94.3 fL (ref 78.0–100.0)
Platelets: 92 10*3/uL — ABNORMAL LOW (ref 150–400)
RBC: 2.97 MIL/uL — ABNORMAL LOW (ref 4.22–5.81)
RDW: 17.8 % — AB (ref 11.5–15.5)
WBC: 5.6 10*3/uL (ref 4.0–10.5)

## 2016-01-31 LAB — RENAL FUNCTION PANEL
ANION GAP: 12 (ref 5–15)
Albumin: 3.6 g/dL (ref 3.5–5.0)
BUN: 17 mg/dL (ref 6–20)
CHLORIDE: 96 mmol/L — AB (ref 101–111)
CO2: 28 mmol/L (ref 22–32)
Calcium: 8.2 mg/dL — ABNORMAL LOW (ref 8.9–10.3)
Creatinine, Ser: 3.43 mg/dL — ABNORMAL HIGH (ref 0.61–1.24)
GFR calc Af Amer: 20 mL/min — ABNORMAL LOW (ref >60)
GFR, EST NON AFRICAN AMERICAN: 18 mL/min — AB (ref >60)
Glucose, Bld: 103 mg/dL — ABNORMAL HIGH (ref 65–99)
POTASSIUM: 3.7 mmol/L (ref 3.5–5.1)
Phosphorus: 2.7 mg/dL (ref 2.5–4.6)
Sodium: 136 mmol/L (ref 135–145)

## 2016-01-31 MED ORDER — SODIUM CHLORIDE 0.9 % IV BOLUS (SEPSIS)
500.0000 mL | Freq: Once | INTRAVENOUS | Status: DC
Start: 2016-01-31 — End: 2016-02-04

## 2016-01-31 MED ORDER — DARBEPOETIN ALFA 100 MCG/0.5ML IJ SOSY: 100.0000 ug | PREFILLED_SYRINGE | INTRAMUSCULAR | Status: DC

## 2016-01-31 NOTE — Progress Notes (Signed)
Assessment/ Plan:   Background: 65 year old gentleman with history of amyloidosis, hypertension, CKD stage 5, chronic diastolic heart failure presenting with acute volume overload. Patient with rising serum creatinine over the last 24 hours as well asbaseline serum creatinine hasbeen generally rising over the past several months.   1. Progressive CKD (CKD stage 5)-->ESRD: Nephrologist Dr. [name]. Patient presenting with likely cardiorenal syndrome in the setting of amyloidosis and HTN. Patient with a fistula placed on 12/27/2015, and will need maturation before future use. - PPD ordered 11/19 - Hep B/C neg 11/17, needs hep B vaccine series - CLIP 2. Anemia: Hgb 8.2.  Giving Aranesp 100 q week here.  3.Metabolic Bone disease:PTH elevated at 213 10/20. Currently on cholecalciferol 3,000 units.  4. AL Amyloidosis - on ixazomib citrate   Subjective: Interval History: -4000cc with HD yesterday  Objective: Vital signs in last 24 hours: Temp:  [97.1 F (36.2 C)-101.6 F (38.7 C)] 98.8 F (37.1 C) (11/21 0802) Pulse Rate:  [65-80] 77 (11/21 0519) Resp:  [14-18] 18 (11/21 0519) BP: (109-150)/(45-64) 145/55 (11/21 0519) SpO2:  [75 %-98 %] 95 % (11/21 0519) Weight:  [92.5 kg (204 lb)-97.9 kg (215 lb 13.3 oz)] 92.5 kg (204 lb) (11/21 0519) Weight change: -2.7 kg (-5 lb 15.2 oz)  Intake/Output from previous day: 11/20 0701 - 11/21 0700 In: 720 [P.O.:720] Out: 4000  Intake/Output this shift: No intake/output data recorded.  General appearance: alert and cooperative Resp: clear to auscultation bilaterally Chest wall: no tenderness Cardio: regular rate and rhythm, S1, S2 normal, no murmur, click, rub or gallop Extremities: edema 2+  Lab Results:  Recent Labs  01/29/16 0230 01/31/16 0439  WBC 4.9 5.6  HGB 8.2* 8.6*  HCT 25.6* 28.0*  PLT 81* 92*   BMET:  Recent Labs  01/30/16 0312 01/31/16 0439  NA 134* 136  K 3.1* 3.7  CL 97* 96*  CO2 28 28  GLUCOSE 96 103*  BUN  16 17  CREATININE 2.66* 3.43*  CALCIUM 8.3* 8.2*   No results for input(s): PTH in the last 72 hours. Iron Studies:  Recent Labs  01/28/16 1402  IRON 45  TIBC 280   Studies/Results: Dg Chest 2 View  Result Date: 01/30/2016 CLINICAL DATA:  Fever began today after dialysis, line placed 4 days ago, history COPD, hypertension, amyloidosis EXAM: CHEST  2 VIEW COMPARISON:  01/23/2016 FINDINGS: RIGHT jugular dual-lumen central venous catheter with tip projecting over RIGHT atrium. Enlargement of cardiac silhouette with pulmonary vascular congestion. Mediastinal contours normal. Chronic interstitial prominence diffusely in both lungs likely reflecting pulmonary edema. Small bibasilar pleural effusions and atelectasis. No definite focal consolidation or pneumothorax. No acute osseous findings. IMPRESSION: Probable mild CHF/fluid overload with small bibasilar pleural effusions and atelectasis. Electronically Signed   By: Lavonia Dana M.D.   On: 01/30/2016 17:46   Scheduled: . acyclovir  400 mg Oral Daily  . amLODipine  10 mg Oral Daily  . aspirin EC  81 mg Oral Daily  . calcium acetate  667 mg Oral TID WC  . carvedilol  25 mg Oral BID WC  . cholecalciferol  3,000 Units Oral Daily  . darbepoetin (ARANESP) injection - NON-DIALYSIS  100 mcg Subcutaneous Q Thu-1800  . ferric gluconate (FERRLECIT/NULECIT) IV  125 mg Intravenous Q T,Th,Sa-HD  . fluticasone  1 spray Each Nare Daily  . gabapentin  300 mg Oral QHS  . heparin  5,000 Units Subcutaneous Q8H  . hydrALAZINE  100 mg Oral TID  . ixazomib citrate  3  mg Oral Q14 Days  . loratadine  10 mg Oral Daily  . sodium chloride  500 mL Intravenous Once  . sodium chloride flush  3 mL Intravenous Q12H  . tuberculin  5 Units Intradermal Once  . vitamin B-12  50 mcg Oral Daily      LOS: 8 days   Analuisa Tudor C 01/31/2016,8:30 AM

## 2016-01-31 NOTE — Progress Notes (Signed)
Pt's temp is 101.6 orally, pt resting in bed, tylenol 650mg  po  Given. Lamar Blinks NP notified and ordered 500cc NS bolus. Pt refused the bolus as pt is on Hemodialysis during this admission. Lamar Blinks NP notified, no orders made at this time. Will continue to monitor.

## 2016-01-31 NOTE — Progress Notes (Signed)
01/31/2016 9:55 AM Hemodialysis Outpatient Note; This patient has been accepted at the [facility] on a Tuesday, Thursday and Saturday 2nd shift schedule. The center can begin treatment on Saturday November 25 at 11:45AM only if Cashiers visits the center on Friday to sign paperwork and consents.Thank you. Jenetta Downer.

## 2016-01-31 NOTE — Progress Notes (Signed)
PROGRESS NOTE    Jose Garrett  HTD:[id number] DOB: [birth year] DOA: 01/23/2016 PCP: [name], MD   Brief Narrative:  Jose Garrett a 65 y.o.malewith medical history significant of AL Amyloidosis, CKD stage 4-5. Patient follows with Dr. [name] for his CKD, and a right forearm fistula created on 12/27/2015 for anticipation of dialysis. Patient reported for the several weeks lower extremity edema was worsening but for the past 10 days got really worse, he has scrotal edema, has DOE and PND and orthopnea. His Lasix was increased recently from 40 to 80 mg by Dr. [name] but he continues to have worsening of his fluid status so he came into the ED for further management.    Assessment & Plan:   Principal Problem:   Chronic kidney disease with dialysis modality undecided, stage 5 [facility]): Progressive Active Problems:   DOE (dyspnea on exertion)   AL amyloidosis   Acute on chronic diastolic CHF (congestive heart failure) (HCC)   Fluid overload, unspecified   Acute pulmonary edema (HCC)   Acute renal failure superimposed on chronic kidney disease (HCC)   Hypervolemia   ESRD (end stage renal disease) (HCC)   CKD (chronic kidney disease) stage 5, GFR less than 15 ml/min (HCC)  Acute pulmonary edema -Patient presented with SOB, DOE, orthopnea and massive lower extremity edema edema. -CXR showed bilateral effusion and interstitial edema, BNP is slightly elevated at 130. -This is secondary to progressive stage Vchronic kidney disease, with no significant improvement despite diuresis.  -Started on IV diuresis and metolazone with a urine output however no significant change in renal function. IV Lasix and metolazone have been changed to oral Demadex. Patient has been started on hemodialysis.  - Patient with renal function that seems to be trending down with the initiation of hemodialysis. Tunneled HD catheter was placed and patient undergoing hemodialysis, with first  treatment on 01/27/2016 with clinical improvement. Per nephrology.  Progressive chronic kidney disease stage V >>> ESRD  -Patient has stage V CKD secondary nephrotic range disease from to biopsy-proven AL amyloidosis with likely progressive CKD V>>ESRD -Kidney biopsy done on 09/08/2014 showed AL amyloid. Patient was on 40 mg of Lasix twice a day. -Recently appears to be worsening renal function, right forearm fistula created on 12/27/2015. -Lasix and metolazone have been changed to oral Demadex per nephrology.  Due to no significant change with renal function tunneled HD catheter was placed per interventional radiology and patient undergoing hemodialysis, 1st one 01/27/2016 per nephrology. Patient for possible hemodialysis today.  Nephrology following and appreciate input and recommendations.  Acute on chronic diastolic CHF -On last echo done in September 2017 LVEF is 60-65% and grade 2 diastolic dysfunction. -Acute pulmonary edema likely secondary to fluid overload from progressive chronic kidney disease. Patient now receiving hemodialysis treatments with clinical improvement. -This is now manifests as acute on chronic diastolic CHF. -Continue home medication including Coreg and hydralazine. - Patient was on IV Lasix and metolazone, has been transitioned to [facility] while on hemodialysis.  Due to patient's fluctuating/plateauing renal function tunneled HD catheter was placed and patient undergoing hemodialysis with clinical improvement. Patient with 4 L removed during hemodialysis. Patient is - 11563 mL during this hospitalization. Patient with likely progressive chronic kidney disease stage V. Patient underwent hemodialysis yesterday and for possible hemodialysis today. Per nephrology.  AL amyloidosis -With renal involvement, patient was following with Dr. [name]. -Is on ixazomib citrate, was on dexamethasone as well for that. Resumed acyclovir.  DOE -The massive lower extremity edema,  DOE  normal from the fluid overload. - Slowly clinically improving. Patient was on metolazone and high-dose IV Lasix. Renal function seems to be trending down and improving since being started on hemodialysis. Clinical improvement. Creatinine currently at 3.43 from 2.66 from 3.56 from 5.03 from 6.16 from 6.02 from 6.14. Patient during hemodialysis had 4 liter removed. Patient is - 11.563 L during this hospitalization. Renal ff.  Hypokalemia Secondary to diureses. Repleted.   Fever Questionable etiology. Chest x-ray negative for any acute infiltrate. Blood cultures pending. UA with cultures and sensitivities pending.     DVT prophylaxis: Heparin Code Status: Full Family Communication: Updated patient. No family at bedside. Disposition Plan: Home once volume overload has resolved and per nephrology.   Consultants:   Nephrology: 01/24/2016  Procedures:   Chest x-ray 01/23/2016, 01/30/2016  Tunneled HD catheter per Dr. [name] 01/27/2016  Antimicrobials:   None   Subjective: Patient sleeping easily arousable. Patient states shortness of breath improvement. No chest pain. in hemodialysis. No chest pain. Patient noted to have a fever of 101.6 earlier this morning.   Objective: Vitals:   01/30/16 1628 01/30/16 2039 01/31/16 0519 01/31/16 0802  BP: (!) 122/57 (!) 117/48 (!) 145/55   Pulse: 67 67 77   Resp: 17 18 18    Temp: 97.2 F (36.2 C) 98.4 F (36.9 C) (!) 101.6 F (38.7 C) 98.8 F (37.1 C)  TempSrc: Oral Oral Oral Oral  SpO2: 98% 94% 95%   Weight: 97.9 kg (215 lb 13.3 oz)  92.5 kg (204 lb)   Height:        Intake/Output Summary (Last 24 hours) at 01/31/16 1019 Last data filed at 01/31/16 0852  Gross per 24 hour  Intake             1080 ml  Output             4000 ml  Net            -2920 ml   Filed Weights   01/30/16 1210 01/30/16 1628 01/31/16 0519  Weight: 95.9 kg (211 lb 6.7 oz) 97.9 kg (215 lb 13.3 oz) 92.5 kg (204 lb)    Examination:  General exam:  Appears calm and comfortable  Respiratory system: Some scattered crackles In bases. Cardiovascular system: S1 & S2 heard, RRR. + JVD, murmurs, rubs, gallops or clicks. 1- 2+ bilateral lower extremity edema. Scrotal edema.  Gastrointestinal system: Abdomen is nondistended, soft and nontender. No organomegaly or masses felt. Normal bowel sounds heard. Central nervous system: Alert and oriented. No focal neurological deficits. Extremities: Symmetric 5 x 5 power. Skin: No rashes, lesions or ulcers Psychiatry: Judgement and insight appear normal. Mood & affect appropriate.     Data Reviewed: I have personally reviewed following labs and imaging studies  CBC:  Recent Labs Lab 01/26/16 0335 01/27/16 0232 01/28/16 0404 01/29/16 0230 01/31/16 0439  WBC 5.2 5.4 4.8 4.9 5.6  HGB 7.8* 7.7* 8.5* 8.2* 8.6*  HCT 24.6* 23.6* 26.3* 25.6* 28.0*  MCV 90.1 89.4 89.8 90.1 94.3  PLT 91* 87* 101* 81* 92*   Basic Metabolic Panel:  Recent Labs Lab 01/27/16 0232 01/28/16 0404 01/29/16 0230 01/30/16 0312 01/31/16 0439  NA 138 140 138 134* 136  K 3.6 3.4* 3.0* 3.1* 3.7  CL 107 105 102 97* 96*  CO2 18* 22 26 28 28   GLUCOSE 107* 91 108* 96 103*  BUN 97* 73* 39* 16 17  CREATININE 6.16* 5.03* 3.56* 2.66* 3.43*  CALCIUM 8.5* 8.5* 8.6*  8.3* 8.2*  PHOS 7.5* 6.1* 3.6 3.5 2.7   GFR: Estimated Creatinine Clearance: 25.2 mL/min (by C-G formula based on SCr of 3.43 mg/dL (H)). Liver Function Tests:  Recent Labs Lab 01/27/16 0232 01/28/16 0404 01/29/16 0230 01/30/16 0312 01/31/16 0439  ALBUMIN 3.6 3.7 3.5 3.3* 3.6   No results for input(s): LIPASE, AMYLASE in the last 168 hours. No results for input(s): AMMONIA in the last 168 hours. Coagulation Profile: No results for input(s): INR, PROTIME in the last 168 hours. Cardiac Enzymes: No results for input(s): CKTOTAL, CKMB, CKMBINDEX, TROPONINI in the last 168 hours. BNP (last 3 results) No results for input(s): PROBNP in the last 8760  hours. HbA1C: No results for input(s): HGBA1C in the last 72 hours. CBG: No results for input(s): GLUCAP in the last 168 hours. Lipid Profile: No results for input(s): CHOL, HDL, LDLCALC, TRIG, CHOLHDL, LDLDIRECT in the last 72 hours. Thyroid Function Tests: No results for input(s): TSH, T4TOTAL, FREET4, T3FREE, THYROIDAB in the last 72 hours. Anemia Panel:  Recent Labs  01/28/16 1402  TIBC 280  IRON 45   Sepsis Labs: No results for input(s): PROCALCITON, LATICACIDVEN in the last 168 hours.  Recent Results (from the past 240 hour(s))  Culture, blood (Routine X 2) w Reflex to ID Panel     Status: None (Preliminary result)   Collection Time: 01/30/16 12:00 PM  Result Value Ref Range Status   Specimen Description HEMODIALYSIS CATHETER  Final   Special Requests BOTTLES DRAWN AEROBIC AND ANAEROBIC 10CC  Final   Culture NO GROWTH < 24 HOURS  Final   Report Status PENDING  Incomplete  Culture, blood (Routine X 2) w Reflex to ID Panel     Status: None (Preliminary result)   Collection Time: 01/30/16 12:10 PM  Result Value Ref Range Status   Specimen Description HEMODIALYSIS CATHETER  Final   Special Requests BOTTLES DRAWN AEROBIC AND ANAEROBIC 10CC  Final   Culture NO GROWTH < 24 HOURS  Final   Report Status PENDING  Incomplete         Radiology Studies: Dg Chest 2 View  Result Date: 01/30/2016 CLINICAL DATA:  Fever began today after dialysis, line placed 4 days ago, history COPD, hypertension, amyloidosis EXAM: CHEST  2 VIEW COMPARISON:  01/23/2016 FINDINGS: RIGHT jugular dual-lumen central venous catheter with tip projecting over RIGHT atrium. Enlargement of cardiac silhouette with pulmonary vascular congestion. Mediastinal contours normal. Chronic interstitial prominence diffusely in both lungs likely reflecting pulmonary edema. Small bibasilar pleural effusions and atelectasis. No definite focal consolidation or pneumothorax. No acute osseous findings. IMPRESSION: Probable  mild CHF/fluid overload with small bibasilar pleural effusions and atelectasis. Electronically Signed   By: Lavonia Dana M.D.   On: 01/30/2016 17:46        Scheduled Meds: . acyclovir  400 mg Oral Daily  . amLODipine  10 mg Oral Daily  . aspirin EC  81 mg Oral Daily  . calcium acetate  667 mg Oral TID WC  . carvedilol  25 mg Oral BID WC  . cholecalciferol  3,000 Units Oral Daily  . [START ON 02/01/2016] darbepoetin (ARANESP) injection - NON-DIALYSIS  100 mcg Subcutaneous Q Wed-1800  . ferric gluconate (FERRLECIT/NULECIT) IV  125 mg Intravenous Q T,Th,Sa-HD  . fluticasone  1 spray Each Nare Daily  . gabapentin  300 mg Oral QHS  . heparin  5,000 Units Subcutaneous Q8H  . hydrALAZINE  100 mg Oral TID  . ixazomib citrate  3 mg Oral Q14 Days  .  loratadine  10 mg Oral Daily  . sodium chloride  500 mL Intravenous Once  . sodium chloride flush  3 mL Intravenous Q12H  . tuberculin  5 Units Intradermal Once  . vitamin B-12  50 mcg Oral Daily   Continuous Infusions:   LOS: 8 days    Time spent: 30 minutes    Jose Haber, MD [facility] Pager [phone]  If 7PM-7AM, please contact night-coverage [email or web address] Password [email or web address] 01/31/2016, 10:19 AM

## 2016-02-01 LAB — CBC
HEMATOCRIT: 26.3 % — AB (ref 39.0–52.0)
HEMOGLOBIN: 8 g/dL — AB (ref 13.0–17.0)
MCH: 28.6 pg (ref 26.0–34.0)
MCHC: 30.4 g/dL (ref 30.0–36.0)
MCV: 93.9 fL (ref 78.0–100.0)
Platelets: 81 10*3/uL — ABNORMAL LOW (ref 150–400)
RBC: 2.8 MIL/uL — AB (ref 4.22–5.81)
RDW: 17.6 % — AB (ref 11.5–15.5)
WBC: 5.2 10*3/uL (ref 4.0–10.5)

## 2016-02-01 LAB — URINALYSIS, ROUTINE W REFLEX MICROSCOPIC
GLUCOSE, UA: 100 mg/dL — AB
Hgb urine dipstick: NEGATIVE
KETONES UR: NEGATIVE mg/dL
NITRITE: NEGATIVE
PH: 5.5 (ref 5.0–8.0)
Protein, ur: >300 mg/dL — AB
SPECIFIC GRAVITY, URINE: 1.02 (ref 1.005–1.030)

## 2016-02-01 LAB — RENAL FUNCTION PANEL
ALBUMIN: 3.4 g/dL — AB (ref 3.5–5.0)
ANION GAP: 11 (ref 5–15)
BUN: 28 mg/dL — ABNORMAL HIGH (ref 6–20)
CHLORIDE: 97 mmol/L — AB (ref 101–111)
CO2: 27 mmol/L (ref 22–32)
Calcium: 8.2 mg/dL — ABNORMAL LOW (ref 8.9–10.3)
Creatinine, Ser: 4.93 mg/dL — ABNORMAL HIGH (ref 0.61–1.24)
GFR, EST AFRICAN AMERICAN: 13 mL/min — AB (ref >60)
GFR, EST NON AFRICAN AMERICAN: 11 mL/min — AB (ref >60)
Glucose, Bld: 116 mg/dL — ABNORMAL HIGH (ref 65–99)
PHOSPHORUS: 3.7 mg/dL (ref 2.5–4.6)
POTASSIUM: 3.1 mmol/L — AB (ref 3.5–5.1)
Sodium: 135 mmol/L (ref 135–145)

## 2016-02-01 LAB — URINE MICROSCOPIC-ADD ON

## 2016-02-01 MED ORDER — DARBEPOETIN ALFA 100 MCG/0.5ML IJ SOSY: 100.0000 ug | PREFILLED_SYRINGE | INTRAMUSCULAR | Status: DC

## 2016-02-01 MED ORDER — DARBEPOETIN ALFA 100 MCG/0.5ML IJ SOSY
100.0000 ug | PREFILLED_SYRINGE | INTRAMUSCULAR | Status: AC
  Administered 2016-02-01: 100 ug via INTRAVENOUS
  Filled 2016-02-01: qty 0.5

## 2016-02-01 MED ORDER — CALCIUM ACETATE (PHOS BINDER) 667 MG PO CAPS
667.0000 mg | ORAL_CAPSULE | Freq: Three times a day (TID) | ORAL | Status: DC
Start: 2016-02-01 — End: 2016-02-04
  Administered 2016-02-01 – 2016-02-04 (×9): 667 mg via ORAL
  Filled 2016-02-01 (×10): qty 1

## 2016-02-01 MED ORDER — SODIUM CHLORIDE 0.9 % IV SOLN
125.0000 mg | INTRAVENOUS | Status: DC
  Filled 2016-02-01: qty 10

## 2016-02-01 MED ORDER — DARBEPOETIN ALFA 100 MCG/0.5ML IJ SOSY
PREFILLED_SYRINGE | INTRAMUSCULAR | Status: AC
  Filled 2016-02-01: qty 0.5

## 2016-02-01 MED ORDER — SODIUM CHLORIDE 0.9 % IV SOLN
125.0000 mg | INTRAVENOUS | Status: AC
  Administered 2016-02-01: 125 mg via INTRAVENOUS
  Filled 2016-02-01 (×2): qty 10

## 2016-02-01 MED ORDER — CARVEDILOL 25 MG PO TABS
25.0000 mg | ORAL_TABLET | Freq: Two times a day (BID) | ORAL | Status: DC
  Administered 2016-02-01 – 2016-02-04 (×5): 25 mg via ORAL
  Filled 2016-02-01 (×6): qty 1

## 2016-02-01 NOTE — Progress Notes (Signed)
Patient arrived to unit by bed.  Reviewed treatment plan and this RN agrees with plan.  Report received from bedside RN, Tiffany.  Consent verified.  Patient A & o X 4.   Lung sounds diminished to ausculation in all fields. BLE 2+ pitting edema. Cardiac:  NSR.  Removed caps and cleansed RIJ catheter with chlorhedxidine.  Aspirated ports of heparin and flushed them with saline per protocol.  Connected and secured lines, initiated treatment at 1723.  UF Goal of 4579mL and net fluid removal 4L.  Will continue to monitor.

## 2016-02-01 NOTE — Progress Notes (Signed)
Pt off the floor, for anticipated 4 hours of dialysis.

## 2016-02-01 NOTE — Progress Notes (Signed)
Assessment/ Plan:   Background: 65 year old gentleman with history of amyloidosis, hypertension, CKD stage 5, chronic diastolic heart failure presenting with acute volume overload. Patient with rising serum creatinine over the last 24 hours as well asbaseline serum creatinine hasbeen generally rising over the past several months.   1. Progressive CKD (CKD stage 5)-->ESRD: Nephrologist Dr. [name]. Patient presenting with likely cardiorenal syndrome in the setting of amyloidosis and HTN. Patient with a fistula placed on 12/27/2015, and will need maturation before future use. - PPD ordered 11/19 - Hep B/C neg 11/17, needs hep B vaccine series - CLIP to [facility] TTS 11:45, he needs to be drier before he can do OP HD 2. Anemia: Hgb 8.2. Giving Aranesp 100 q week here.  3.Metabolic Bone disease:PTH elevated at 213 10/20. Currently on cholecalciferol 3,000 units.  4. AL Amyloidosis - on ixazomib citrate    Subjective: Interval History: some SOB persisits  Objective: Vital signs in last 24 hours: Temp:  [98.9 F (37.2 C)] 98.9 F (37.2 C) (11/22 0538) Pulse Rate:  [70-72] 72 (11/22 0538) Resp:  [16-18] 16 (11/22 0538) BP: (129-139)/(48-51) 139/51 (11/22 0538) SpO2:  [93 %] 93 % (11/22 0538) Weight:  [94 kg (207 lb 3.2 oz)] 94 kg (207 lb 3.2 oz) (11/22 0538) Weight change: -1.915 kg (-4 lb 3.5 oz)  Intake/Output from previous day: 11/21 0701 - 11/22 0700 In: 945 [P.O.:945] Out: 0  Intake/Output this shift: Total I/O In: 240 [P.O.:240] Out: 25 [Urine:25]  General appearance: alert and cooperative Resp: diminished breath sounds bibasilar Cardio: regular rate and rhythm, S1, S2 normal, no murmur, click, rub or gallop Extremities: edema 3+  Lab Results:  Recent Labs  01/31/16 0439 02/01/16 0351  WBC 5.6 5.2  HGB 8.6* 8.0*  HCT 28.0* 26.3*  PLT 92* 81*   BMET:  Recent Labs  01/31/16 0439 02/01/16 0352  NA 136 135  K 3.7 3.1*  CL 96* 97*  CO2 28 27  GLUCOSE  103* 116*  BUN 17 28*  CREATININE 3.43* 4.93*  CALCIUM 8.2* 8.2*   No results for input(s): PTH in the last 72 hours. Iron Studies: No results for input(s): IRON, TIBC, TRANSFERRIN, FERRITIN in the last 72 hours. Studies/Results: Dg Chest 2 View  Result Date: 01/30/2016 CLINICAL DATA:  Fever began today after dialysis, line placed 4 days ago, history COPD, hypertension, amyloidosis EXAM: CHEST  2 VIEW COMPARISON:  01/23/2016 FINDINGS: RIGHT jugular dual-lumen central venous catheter with tip projecting over RIGHT atrium. Enlargement of cardiac silhouette with pulmonary vascular congestion. Mediastinal contours normal. Chronic interstitial prominence diffusely in both lungs likely reflecting pulmonary edema. Small bibasilar pleural effusions and atelectasis. No definite focal consolidation or pneumothorax. No acute osseous findings. IMPRESSION: Probable mild CHF/fluid overload with small bibasilar pleural effusions and atelectasis. Electronically Signed   By: Lavonia Dana M.D.   On: 01/30/2016 17:46    Scheduled: . acyclovir  400 mg Oral Daily  . amLODipine  10 mg Oral Daily  . aspirin EC  81 mg Oral Daily  . calcium acetate  667 mg Oral TID WC  . carvedilol  25 mg Oral BID WC  . cholecalciferol  3,000 Units Oral Daily  . darbepoetin (ARANESP) injection - DIALYSIS  100 mcg Intravenous Q Wed-HD  . [START ON 02/09/2016] darbepoetin (ARANESP) injection - DIALYSIS  100 mcg Intravenous Q Thu-HD  . [START ON 02/04/2016] ferric gluconate (FERRLECIT/NULECIT) IV  125 mg Intravenous Q T,Th,Sa-HD  . ferric gluconate (FERRLECIT/NULECIT) IV  125 mg Intravenous  Q Wed-HD  . fluticasone  1 spray Each Nare Daily  . gabapentin  300 mg Oral QHS  . heparin  5,000 Units Subcutaneous Q8H  . hydrALAZINE  100 mg Oral TID  . ixazomib citrate  3 mg Oral Q14 Days  . loratadine  10 mg Oral Daily  . sodium chloride  500 mL Intravenous Once  . sodium chloride flush  3 mL Intravenous Q12H  . vitamin B-12  50 mcg  Oral Daily    LOS: 9 days   Reba Hulett C 02/01/2016,1:48 PM

## 2016-02-01 NOTE — Progress Notes (Addendum)
meds requested from pharmacy.  02/01/2016 1250   Disregard, meds to be given at dialysis. 02/01/16 1:13 PM

## 2016-02-01 NOTE — Progress Notes (Signed)
PROGRESS NOTE  Jose Garrett XFG:[id number] DOB: May 11, 1950 DOA: 01/23/2016 PCP: [name], MD  Brief History:  Jose Garrett a 65 y.o.Garrett medical history significant of AL Amyloidosis, CKD stage 4-5. Patient follows with Dr. [name] for his CKD, and a right forearm fistula created on 12/27/2015 for anticipation of dialysis. Patient reported for the several weeks lower extremity edema was worsening but for the past 10 days got really worse, he has scrotal edema, has DOE and PND and orthopnea. His Lasix was increased recently from 40 to 80 mg by Dr. [name] but he continues to have worsening of his fluid status so he came into the ED for further management.  Assessment/Plan: Acute pulmonary edema -Patient presented with SOB, DOE, orthopnea and massive lower extremity edema edema. -CXR showed bilateral effusion and interstitial edema -secondary to progressive stage Vchronic kidney disease, with no significant improvement despite diuresis.  -Started on IV diuresis and metolazone with a urine output however no significant change in renal function. IV Lasix and metolazone have been changed to oral Demadex which has been d/ced as patient has been started on hemodialysis.  -Tunneled HD catheter was placed and patient undergoing hemodialysis, with first treatment on 01/27/2016 -remains clinically fluid overloaded  Progressive chronic kidney disease stage V >>> ESRD  -Patient has stage V CKD secondary nephrotic range disease from to biopsy-proven AL amyloidosis with likely progressive CKD V>>ESRD -Kidney biopsy done on 09/08/2014 showed AL amyloid.  -right forearm fistula created on 12/27/2015.  -First HD on 01/27/2016 per nephrology.  -Nephrology following and appreciate input and recommendations.  Acute on chronic diastolic CHF -9/67/89 Echo--LVEF is 60-65% and grade 2 diastolic dysfunction. -Acute pulmonary edema likely secondary to fluid overload from  progressive chronic kidney disease. -Continue home medication including Coreg and hydralazine. - Patient was on IV Lasix and metolazone, has been transitioned to [facility] while on hemodialysis. -NEG 11 L with HD during the admission  AL amyloidosis -With renal involvement, patient was following with Dr. [name]. -Is on ixazomib citrate, was on dexamethasone as well for that. Resumed acyclovir.  Hypokalemia Secondary to diureses. Repleted.   Fever -developed 101.6 temp early am 01/31/16 -afebrile and hemodynamically stable since then -Chest x-ray negative for any acute infiltrate. -blood cultures neg to date -remain off abx for now  Hypertension -may have to reduce BP med with ongoing HD -continue coreg, amlodipine and hydralazine for now  Thrombocytopenia -d/c heparin -SCDs     Disposition Plan:   Home when cleared by renal Family Communication:  No Family at bedside  Consultants:  Nephrology  Code Status:  FULL   DVT Prophylaxis:  SCDs  Procedures: As Listed in Progress Note Above  Antibiotics: None    Subjective: Patient denies fevers, chills, headache, chest pain, dyspnea, nausea, vomiting, diarrhea, abdominal pain, dysuria, hematuria, hematochezia, and melena.   Objective: Vitals:   02/01/16 1640 02/01/16 1718 02/01/16 1723 02/01/16 1730  BP: 135/60 (!) 154/61 140/67 137/63  Pulse:  73 71 68  Resp:  15    Temp:  98 F (36.7 C)    TempSrc:      SpO2:      Weight:      Height:        Intake/Output Summary (Last 24 hours) at 02/01/16 1742 Last data filed at 02/01/16 1713  Gross per 24 hour  Intake              345 ml  Output              450 ml  Net             -105 ml   Weight change: -1.915 kg (-4 lb 3.5 oz) Exam:   General:  Pt is alert, follows commands appropriately, not in acute distress  HEENT: No icterus, No thrush, No neck mass, [place]/AT  Cardiovascular: RRR, S1/S2, no rubs, no gallops  Respiratory: bibasilar crackles, no  wheeze  Abdomen: Soft/+BS, non tender, non distended, no guarding  Extremities: 2 + LE edema, No lymphangitis, No petechiae, No rashes, no synovitis   Data Reviewed: I have personally reviewed following labs and imaging studies Basic Metabolic Panel:  Recent Labs Lab 01/28/16 0404 01/29/16 0230 01/30/16 0312 01/31/16 0439 02/01/16 0352  NA 140 138 134* 136 135  K 3.4* 3.0* 3.1* 3.7 3.1*  CL 105 102 97* 96* 97*  CO2 22 26 28 28 27   GLUCOSE 91 108* 96 103* 116*  BUN 73* 39* 16 17 28*  CREATININE 5.03* 3.56* 2.66* 3.43* 4.93*  CALCIUM 8.5* 8.6* 8.3* 8.2* 8.2*  PHOS 6.1* 3.6 3.5 2.7 3.7   Liver Function Tests:  Recent Labs Lab 01/28/16 0404 01/29/16 0230 01/30/16 0312 01/31/16 0439 02/01/16 0352  ALBUMIN 3.7 3.5 3.3* 3.6 3.4*   No results for input(s): LIPASE, AMYLASE in the last 168 hours. No results for input(s): AMMONIA in the last 168 hours. Coagulation Profile: No results for input(s): INR, PROTIME in the last 168 hours. CBC:  Recent Labs Lab 01/27/16 0232 01/28/16 0404 01/29/16 0230 01/31/16 0439 02/01/16 0351  WBC 5.4 4.8 4.9 5.6 5.2  HGB 7.7* 8.5* 8.2* 8.6* 8.0*  HCT 23.6* 26.3* 25.6* 28.0* 26.3*  MCV 89.4 89.8 90.1 94.3 93.9  PLT 87* 101* 81* 92* 81*   Cardiac Enzymes: No results for input(s): CKTOTAL, CKMB, CKMBINDEX, TROPONINI in the last 168 hours. BNP: Invalid input(s): POCBNP CBG: No results for input(s): GLUCAP in the last 168 hours. HbA1C: No results for input(s): HGBA1C in the last 72 hours. Urine analysis: No results found for: COLORURINE, APPEARANCEUR, LABSPEC, [place], GLUCOSEU, HGBUR, BILIRUBINUR, KETONESUR, [place], UROBILINOGEN, NITRITE, LEUKOCYTESUR Sepsis Labs: @LABRCNTIP (procalcitonin:4,lacticidven:4) ) Recent Results (from the past 240 hour(s))  Culture, blood (Routine X 2) w Reflex to ID Panel     Status: None (Preliminary result)   Collection Time: 01/30/16 12:00 PM  Result Value Ref Range Status   Specimen  Description HEMODIALYSIS CATHETER  Final   Special Requests BOTTLES DRAWN AEROBIC AND ANAEROBIC 10CC  Final   Culture NO GROWTH 2 DAYS  Final   Report Status PENDING  Incomplete  Culture, blood (Routine X 2) w Reflex to ID Panel     Status: None (Preliminary result)   Collection Time: 01/30/16 12:10 PM  Result Value Ref Range Status   Specimen Description HEMODIALYSIS CATHETER  Final   Special Requests BOTTLES DRAWN AEROBIC AND ANAEROBIC 10CC  Final   Culture NO GROWTH 2 DAYS  Final   Report Status PENDING  Incomplete     Scheduled Meds: . acyclovir  400 mg Oral Daily  . amLODipine  10 mg Oral Daily  . aspirin EC  81 mg Oral Daily  . calcium acetate  667 mg Oral TID WC  . carvedilol  25 mg Oral BID WC  . cholecalciferol  3,000 Units Oral Daily  . darbepoetin (ARANESP) injection - DIALYSIS  100 mcg Intravenous Q Wed-HD  . [START ON 02/09/2016] darbepoetin (ARANESP) injection - DIALYSIS  100 mcg Intravenous Q Thu-HD  . [START ON 02/04/2016] ferric gluconate (FERRLECIT/NULECIT) IV  125 mg Intravenous Q T,Th,Sa-HD  . ferric gluconate (FERRLECIT/NULECIT) IV  125 mg Intravenous Q Wed-HD  . fluticasone  1 spray Each Nare Daily  . gabapentin  300 mg Oral QHS  . heparin  5,000 Units Subcutaneous Q8H  . hydrALAZINE  100 mg Oral TID  . ixazomib citrate  3 mg Oral Q14 Days  . loratadine  10 mg Oral Daily  . sodium chloride  500 mL Intravenous Once  . sodium chloride flush  3 mL Intravenous Q12H  . vitamin B-12  50 mcg Oral Daily   Continuous Infusions:  Procedures/Studies: Dg Chest 2 View  Result Date: 01/30/2016 CLINICAL DATA:  Fever began today after dialysis, line placed 4 days ago, history COPD, hypertension, amyloidosis EXAM: CHEST  2 VIEW COMPARISON:  01/23/2016 FINDINGS: RIGHT jugular dual-lumen central venous catheter with tip projecting over RIGHT atrium. Enlargement of cardiac silhouette with pulmonary vascular congestion. Mediastinal contours normal. Chronic interstitial  prominence diffusely in both lungs likely reflecting pulmonary edema. Small bibasilar pleural effusions and atelectasis. No definite focal consolidation or pneumothorax. No acute osseous findings. IMPRESSION: Probable mild CHF/fluid overload with small bibasilar pleural effusions and atelectasis. Electronically Signed   By: Lavonia Dana M.D.   On: 01/30/2016 17:46   Dg Chest 2 View  Result Date: 01/23/2016 CLINICAL DATA:  Increased shortness of breath over the past month. History of hypertension, CHF. EXAM: CHEST  2 VIEW COMPARISON:  PA and lateral chest x-ray of August 14, 2015 FINDINGS: The lungs are well-expanded. There small bilateral pleural effusions which are new. The interstitial markings are increased and more conspicuous than on the previous study. The heart is top-normal in size. The pulmonary vascularity is prominent centrally but stable. There is calcification in the wall of the aortic arch. The bony thorax exhibits no acute abnormality. IMPRESSION: New bilateral pleural effusions and mild interstitial edema consistent with CHF. This is superimposed upon chronic bronchitic change. No alveolar pneumonia. Thoracic aortic atherosclerosis. Electronically Signed   By: Rahaf Carbonell  [place] M.D.   On: 01/23/2016 14:00   Ir Fluoro Guide Cv Line Right  Result Date: 01/27/2016 CLINICAL DATA:  Renal failure, needs IV access for hemodialysis. EXAM: TUNNELED HEMODIALYSIS CATHETER PLACEMENT WITH ULTRASOUND AND FLUOROSCOPIC GUIDANCE TECHNIQUE: The procedure, risks, benefits, and alternatives were explained to the patient. Questions regarding the procedure were encouraged and answered. The patient understands and consents to the procedure. As antibiotic prophylaxis, cefazolin 2 g was ordered pre-procedure and administered intravenously within one hour of incision.Patency of the right IJ vein was confirmed with ultrasound with image documentation. An appropriate skin site was determined. Region was prepped using maximum  barrier technique including cap and mask, sterile gown, sterile gloves, large sterile sheet, and Chlorhexidine as cutaneous antisepsis. The region was infiltrated locally with 1% lidocaine. Intravenous Fentanyl and Versed were administered as conscious sedation during continuous monitoring of the patient's level of consciousness and physiological / cardiorespiratory status by the radiology RN, with a total moderate sedation time of 15 minutes. Under real-time ultrasound guidance, the right IJ vein was accessed with a 21 gauge micropuncture needle; the needle tip within the vein was confirmed with ultrasound image documentation. Needle exchanged over the 018 guidewire for transitional dilator, which allowed advancement of a Benson wire into the IVC. Over this, an MPA catheter was advanced. A Palindrome 23 hemodialysis catheter was tunneled from the right anterior chest wall approach to the right IJ  dermatotomy site. The MPA catheter was exchanged over an Amplatz wire for serial vascular dilators which allow placement of a peel-away sheath, through which the catheter was advanced under intermittent fluoroscopy, positioned with its tips in the proximal and midright atrium. Spot chest radiograph confirms good catheter position. No pneumothorax. Catheter was flushed and primed per protocol. Catheter secured externally with O Prolene sutures. The right IJ dermatotomy site was closed with Dermabond. COMPLICATIONS: COMPLICATIONS None immediate FLUOROSCOPY TIME:  18 seconds, 2 mGy COMPARISON:  None IMPRESSION: 1. Technically successful placement of tunneled right IJ hemodialysis catheter with ultrasound and fluoroscopic guidance. Ready for routine use. ACCESS: Remains approachable for percutaneous intervention as needed. Electronically Signed   By: Lucrezia Europe M.D.   On: 01/27/2016 16:18   Ir US Guide Vasc Access Right  Result Date: 01/27/2016 CLINICAL DATA:  Renal failure, needs IV access for hemodialysis. EXAM:  TUNNELED HEMODIALYSIS CATHETER PLACEMENT WITH ULTRASOUND AND FLUOROSCOPIC GUIDANCE TECHNIQUE: The procedure, risks, benefits, and alternatives were explained to the patient. Questions regarding the procedure were encouraged and answered. The patient understands and consents to the procedure. As antibiotic prophylaxis, cefazolin 2 g was ordered pre-procedure and administered intravenously within one hour of incision.Patency of the right IJ vein was confirmed with ultrasound with image documentation. An appropriate skin site was determined. Region was prepped using maximum barrier technique including cap and mask, sterile gown, sterile gloves, large sterile sheet, and Chlorhexidine as cutaneous antisepsis. The region was infiltrated locally with 1% lidocaine. Intravenous Fentanyl and Versed were administered as conscious sedation during continuous monitoring of the patient's level of consciousness and physiological / cardiorespiratory status by the radiology RN, with a total moderate sedation time of 15 minutes. Under real-time ultrasound guidance, the right IJ vein was accessed with a 21 gauge micropuncture needle; the needle tip within the vein was confirmed with ultrasound image documentation. Needle exchanged over the 018 guidewire for transitional dilator, which allowed advancement of a Benson wire into the IVC. Over this, an MPA catheter was advanced. A Palindrome 23 hemodialysis catheter was tunneled from the right anterior chest wall approach to the right IJ dermatotomy site. The MPA catheter was exchanged over an Amplatz wire for serial vascular dilators which allow placement of a peel-away sheath, through which the catheter was advanced under intermittent fluoroscopy, positioned with its tips in the proximal and midright atrium. Spot chest radiograph confirms good catheter position. No pneumothorax. Catheter was flushed and primed per protocol. Catheter secured externally with O Prolene sutures. The right IJ  dermatotomy site was closed with Dermabond. COMPLICATIONS: COMPLICATIONS None immediate FLUOROSCOPY TIME:  18 seconds, 2 mGy COMPARISON:  None IMPRESSION: 1. Technically successful placement of tunneled right IJ hemodialysis catheter with ultrasound and fluoroscopic guidance. Ready for routine use. ACCESS: Remains approachable for percutaneous intervention as needed. Electronically Signed   By: Lucrezia Europe M.D.   On: 01/27/2016 16:18    Hudsyn Champine, DO  [facility] Pager [phone]  If 7PM-7AM, please contact night-coverage [email or web address] Password [email or web address] 02/01/2016, 5:42 PM   LOS: 9 days

## 2016-02-01 NOTE — Progress Notes (Signed)
TB skin test read by this RN is negative. No induration at the mid left forearm site. Lab sheet left in patient's chart. Will continue to monitor.

## 2016-02-02 NOTE — Progress Notes (Signed)
PROGRESS NOTE  HIPOLITO Garrett WUJ:[id number] DOB: Jan 12, 1951 DOA: 01/23/2016 PCP: [name], MD  Brief History:  Jose Tory Pooleis a 65 y.o.malewith medical history significant of AL Amyloidosis, CKD stage 4-5. Patient follows with Dr. [name] for his CKD, and a right forearm fistula created on 12/27/2015 for anticipation of dialysis. Patient reported for the several weeks lower extremity edema was worsening but for the past 10 days got really worse, he has scrotal edema, has DOE and PND and orthopnea. His Lasix was increased recently from 40 to 80 mg by Dr. [name] but he continues to have worsening of his fluid status so he came into the ED for further management.  Assessment/Plan: Acute respiratory failure with hypoxia -secondary to pulmonary edema -weaned to RA -11/23--no desaturation with ambulation  Acute pulmonary edema -Patient presented with SOB, DOE, orthopnea and massive lower extremity edema edema. -CXR showed bilateral effusion and interstitial edema -secondary to progressive stage Vchronic kidney disease, with no significant improvement despite diuresis.  -Started on IV diuresis and metolazone with a urine output however no significant change in renal function. IV Lasix and metolazone were  changed to oral Demadex which has been d/ced as patient has been started on hemodialysis.  -Tunneled HD catheter was placed and patient undergoing hemodialysis, with first treatment on 01/27/2016 -Next HD 11/24  Progressive chronic kidney disease stage V >>>ESRD  -Patient has stage V CKD secondary nephrotic range disease from to biopsy-proven AL amyloidosis with likely progressive CKD V>>ESRD -Kidney biopsy done on 09/08/2014 showed AL amyloid.  -right forearm fistula created on 12/27/2015.  -First HD on 01/27/2016 per nephrology.  -Nephrology following and appreciate input and recommendations.  Leg Edema, L>R leg -venous duplex r/o DVT  Acute on  chronic diastolic CHF -9/56/21 Echo--LVEF is 60-65% and grade 2 diastolic dysfunction. -Acute pulmonary edema likely secondary to fluid overload from progressive chronic kidney disease. -Continue home medication including Coreg and hydralazine. - Patient was on IV Lasix and metolazone, has been transitioned to [facility] while on hemodialysis. -NEG 14.5 L with HD during the admission  AL amyloidosis -With renal involvement, patient was following with Dr. [name]. -Is on ixazomib citrate, was on dexamethasone as well for that. Resumed acyclovir.  Hypokalemia Secondary to diureses. Repleted.   Fever -developed 101.6 temp early am 01/31/16 -afebrile and hemodynamically stable since then -Chest x-ray negative for any acute infiltrate. -blood cultures neg to date -remain off abx for now  Hypertension -may have to reduce BP med with ongoing HD -continue coreg, amlodipine and hydralazine for now  Thrombocytopenia -d/c heparin -SCDs      Disposition Plan:   Home when cleared by renal--possible 11/24 Family Communication:  No Family at bedside  Consultants:  Nephrology  Code Status:  FULL   DVT Prophylaxis:  SCDs  Procedures: As Listed in Progress Note Above  Antibiotics: None    Subjective: Patient denies fevers, chills, headache, chest pain, dyspnea, nausea, vomiting, diarrhea, abdominal pain, dysuria, hematuria, hematochezia, and melena.   Objective: Vitals:   02/01/16 2147 02/02/16 0504 02/02/16 0952 02/02/16 1131  BP: (!) 143/65 (!) 124/50 (!) 129/52 (!) 122/53  Pulse: 71 69 68 69  Resp: 18 20  16   Temp: 98.2 F (36.8 C) 98.8 F (37.1 C)  98.9 F (37.2 C)  TempSrc: Oral Oral  Oral  SpO2: 97% 96%  93%  Weight:  90.5 kg (199 lb 8 oz)    Height:  Intake/Output Summary (Last 24 hours) at 02/02/16 1648 Last data filed at 02/02/16 0853  Gross per 24 hour  Intake              830 ml  Output             4425 ml  Net            -3595 ml    Weight change: 1.115 kg (2 lb 7.3 oz) Exam:   General:  Pt is alert, follows commands appropriately, not in acute distress  HEENT: No icterus, No thrush, No neck mass, [place]/AT  Cardiovascular: RRR, S1/S2, no rubs, no gallops  Respiratory:bibasilar crackles, no wheeze. Good air movement  Abdomen: Soft/+BS, non tender, non distended, no guarding  Extremities: 1 + LE edema, No lymphangitis, No petechiae, No rashes, no synovitis   Data Reviewed: I have personally reviewed following labs and imaging studies Basic Metabolic Panel:  Recent Labs Lab 01/28/16 0404 01/29/16 0230 01/30/16 0312 01/31/16 0439 02/01/16 0352  NA 140 138 134* 136 135  K 3.4* 3.0* 3.1* 3.7 3.1*  CL 105 102 97* 96* 97*  CO2 22 26 28 28 27   GLUCOSE 91 108* 96 103* 116*  BUN 73* 39* 16 17 28*  CREATININE 5.03* 3.56* 2.66* 3.43* 4.93*  CALCIUM 8.5* 8.6* 8.3* 8.2* 8.2*  PHOS 6.1* 3.6 3.5 2.7 3.7   Liver Function Tests:  Recent Labs Lab 01/28/16 0404 01/29/16 0230 01/30/16 0312 01/31/16 0439 02/01/16 0352  ALBUMIN 3.7 3.5 3.3* 3.6 3.4*   No results for input(s): LIPASE, AMYLASE in the last 168 hours. No results for input(s): AMMONIA in the last 168 hours. Coagulation Profile: No results for input(s): INR, PROTIME in the last 168 hours. CBC:  Recent Labs Lab 01/27/16 0232 01/28/16 0404 01/29/16 0230 01/31/16 0439 02/01/16 0351  WBC 5.4 4.8 4.9 5.6 5.2  HGB 7.7* 8.5* 8.2* 8.6* 8.0*  HCT 23.6* 26.3* 25.6* 28.0* 26.3*  MCV 89.4 89.8 90.1 94.3 93.9  PLT 87* 101* 81* 92* 81*   Cardiac Enzymes: No results for input(s): CKTOTAL, CKMB, CKMBINDEX, TROPONINI in the last 168 hours. BNP: Invalid input(s): POCBNP CBG: No results for input(s): GLUCAP in the last 168 hours. HbA1C: No results for input(s): HGBA1C in the last 72 hours. Urine analysis:    Component Value Date/Time   COLORURINE AMBER (A) 02/01/2016 1707   APPEARANCEUR CLOUDY (A) 02/01/2016 1707   LABSPEC 1.020 02/01/2016 1707    PHURINE 5.5 02/01/2016 1707   GLUCOSEU 100 (A) 02/01/2016 1707   HGBUR NEGATIVE 02/01/2016 1707   BILIRUBINUR SMALL (A) 02/01/2016 1707   KETONESUR NEGATIVE 02/01/2016 1707   PROTEINUR >300 (A) 02/01/2016 1707   NITRITE NEGATIVE 02/01/2016 1707   LEUKOCYTESUR TRACE (A) 02/01/2016 1707   Sepsis Labs: @LABRCNTIP (procalcitonin:4,lacticidven:4) ) Recent Results (from the past 240 hour(s))  Culture, blood (Routine X 2) w Reflex to ID Panel     Status: None (Preliminary result)   Collection Time: 01/30/16 12:00 PM  Result Value Ref Range Status   Specimen Description HEMODIALYSIS CATHETER  Final   Special Requests BOTTLES DRAWN AEROBIC AND ANAEROBIC 10CC  Final   Culture NO GROWTH 3 DAYS  Final   Report Status PENDING  Incomplete  Culture, blood (Routine X 2) w Reflex to ID Panel     Status: None (Preliminary result)   Collection Time: 01/30/16 12:10 PM  Result Value Ref Range Status   Specimen Description HEMODIALYSIS CATHETER  Final   Special Requests BOTTLES DRAWN AEROBIC AND  ANAEROBIC 10CC  Final   Culture NO GROWTH 3 DAYS  Final   Report Status PENDING  Incomplete     Scheduled Meds: . acyclovir  400 mg Oral Daily  . amLODipine  10 mg Oral Daily  . aspirin EC  81 mg Oral Daily  . calcium acetate  667 mg Oral TID WC  . carvedilol  25 mg Oral BID WC  . cholecalciferol  3,000 Units Oral Daily  . [START ON 02/09/2016] darbepoetin (ARANESP) injection - DIALYSIS  100 mcg Intravenous Q Thu-HD  . [START ON 02/04/2016] ferric gluconate (FERRLECIT/NULECIT) IV  125 mg Intravenous Q T,Th,Sa-HD  . fluticasone  1 spray Each Nare Daily  . gabapentin  300 mg Oral QHS  . hydrALAZINE  100 mg Oral TID  . ixazomib citrate  3 mg Oral Q14 Days  . loratadine  10 mg Oral Daily  . sodium chloride  500 mL Intravenous Once  . sodium chloride flush  3 mL Intravenous Q12H  . vitamin B-12  50 mcg Oral Daily   Continuous Infusions:  Procedures/Studies: Dg Chest 2 View  Result Date:  01/30/2016 CLINICAL DATA:  Fever began today after dialysis, line placed 4 days ago, history COPD, hypertension, amyloidosis EXAM: CHEST  2 VIEW COMPARISON:  01/23/2016 FINDINGS: RIGHT jugular dual-lumen central venous catheter with tip projecting over RIGHT atrium. Enlargement of cardiac silhouette with pulmonary vascular congestion. Mediastinal contours normal. Chronic interstitial prominence diffusely in both lungs likely reflecting pulmonary edema. Small bibasilar pleural effusions and atelectasis. No definite focal consolidation or pneumothorax. No acute osseous findings. IMPRESSION: Probable mild CHF/fluid overload with small bibasilar pleural effusions and atelectasis. Electronically Signed   By: Lavonia Dana M.D.   On: 01/30/2016 17:46   Dg Chest 2 View  Result Date: 01/23/2016 CLINICAL DATA:  Increased shortness of breath over the past month. History of hypertension, CHF. EXAM: CHEST  2 VIEW COMPARISON:  PA and lateral chest x-ray of August 14, 2015 FINDINGS: The lungs are well-expanded. There small bilateral pleural effusions which are new. The interstitial markings are increased and more conspicuous than on the previous study. The heart is top-normal in size. The pulmonary vascularity is prominent centrally but stable. There is calcification in the wall of the aortic arch. The bony thorax exhibits no acute abnormality. IMPRESSION: New bilateral pleural effusions and mild interstitial edema consistent with CHF. This is superimposed upon chronic bronchitic change. No alveolar pneumonia. Thoracic aortic atherosclerosis. Electronically Signed   By: Jarrel Knoke  [place] M.D.   On: 01/23/2016 14:00   Ir Fluoro Guide Cv Line Right  Result Date: 01/27/2016 CLINICAL DATA:  Renal failure, needs IV access for hemodialysis. EXAM: TUNNELED HEMODIALYSIS CATHETER PLACEMENT WITH ULTRASOUND AND FLUOROSCOPIC GUIDANCE TECHNIQUE: The procedure, risks, benefits, and alternatives were explained to the patient. Questions  regarding the procedure were encouraged and answered. The patient understands and consents to the procedure. As antibiotic prophylaxis, cefazolin 2 g was ordered pre-procedure and administered intravenously within one hour of incision.Patency of the right IJ vein was confirmed with ultrasound with image documentation. An appropriate skin site was determined. Region was prepped using maximum barrier technique including cap and mask, sterile gown, sterile gloves, large sterile sheet, and Chlorhexidine as cutaneous antisepsis. The region was infiltrated locally with 1% lidocaine. Intravenous Fentanyl and Versed were administered as conscious sedation during continuous monitoring of the patient's level of consciousness and physiological / cardiorespiratory status by the radiology RN, with a total moderate sedation time of 15 minutes. Under real-time ultrasound guidance,  the right IJ vein was accessed with a 21 gauge micropuncture needle; the needle tip within the vein was confirmed with ultrasound image documentation. Needle exchanged over the 018 guidewire for transitional dilator, which allowed advancement of a Benson wire into the IVC. Over this, an MPA catheter was advanced. A Palindrome 23 hemodialysis catheter was tunneled from the right anterior chest wall approach to the right IJ dermatotomy site. The MPA catheter was exchanged over an Amplatz wire for serial vascular dilators which allow placement of a peel-away sheath, through which the catheter was advanced under intermittent fluoroscopy, positioned with its tips in the proximal and midright atrium. Spot chest radiograph confirms good catheter position. No pneumothorax. Catheter was flushed and primed per protocol. Catheter secured externally with O Prolene sutures. The right IJ dermatotomy site was closed with Dermabond. COMPLICATIONS: COMPLICATIONS None immediate FLUOROSCOPY TIME:  18 seconds, 2 mGy COMPARISON:  None IMPRESSION: 1. Technically successful  placement of tunneled right IJ hemodialysis catheter with ultrasound and fluoroscopic guidance. Ready for routine use. ACCESS: Remains approachable for percutaneous intervention as needed. Electronically Signed   By: Lucrezia Europe M.D.   On: 01/27/2016 16:18   Ir US Guide Vasc Access Right  Result Date: 01/27/2016 CLINICAL DATA:  Renal failure, needs IV access for hemodialysis. EXAM: TUNNELED HEMODIALYSIS CATHETER PLACEMENT WITH ULTRASOUND AND FLUOROSCOPIC GUIDANCE TECHNIQUE: The procedure, risks, benefits, and alternatives were explained to the patient. Questions regarding the procedure were encouraged and answered. The patient understands and consents to the procedure. As antibiotic prophylaxis, cefazolin 2 g was ordered pre-procedure and administered intravenously within one hour of incision.Patency of the right IJ vein was confirmed with ultrasound with image documentation. An appropriate skin site was determined. Region was prepped using maximum barrier technique including cap and mask, sterile gown, sterile gloves, large sterile sheet, and Chlorhexidine as cutaneous antisepsis. The region was infiltrated locally with 1% lidocaine. Intravenous Fentanyl and Versed were administered as conscious sedation during continuous monitoring of the patient's level of consciousness and physiological / cardiorespiratory status by the radiology RN, with a total moderate sedation time of 15 minutes. Under real-time ultrasound guidance, the right IJ vein was accessed with a 21 gauge micropuncture needle; the needle tip within the vein was confirmed with ultrasound image documentation. Needle exchanged over the 018 guidewire for transitional dilator, which allowed advancement of a Benson wire into the IVC. Over this, an MPA catheter was advanced. A Palindrome 23 hemodialysis catheter was tunneled from the right anterior chest wall approach to the right IJ dermatotomy site. The MPA catheter was exchanged over an Amplatz wire  for serial vascular dilators which allow placement of a peel-away sheath, through which the catheter was advanced under intermittent fluoroscopy, positioned with its tips in the proximal and midright atrium. Spot chest radiograph confirms good catheter position. No pneumothorax. Catheter was flushed and primed per protocol. Catheter secured externally with O Prolene sutures. The right IJ dermatotomy site was closed with Dermabond. COMPLICATIONS: COMPLICATIONS None immediate FLUOROSCOPY TIME:  18 seconds, 2 mGy COMPARISON:  None IMPRESSION: 1. Technically successful placement of tunneled right IJ hemodialysis catheter with ultrasound and fluoroscopic guidance. Ready for routine use. ACCESS: Remains approachable for percutaneous intervention as needed. Electronically Signed   By: Lucrezia Europe M.D.   On: 01/27/2016 16:18    Hakeen Shipes, DO  [facility] Pager [phone]  If 7PM-7AM, please contact night-coverage [email or web address] Password [email or web address] 02/02/2016, 4:48 PM   LOS: 10 days

## 2016-02-02 NOTE — Progress Notes (Signed)
Assessment/ Plan:   Background: 65 year old gentleman with history of amyloidosis, hypertension, CKD stage 5, chronic diastolic heart failure presenting with acute volume overload.   1. Progressive CKD (CKD stage 5)-->ESRD: Nephrologist Dr. [name].  Cardiorenal syndrome in the setting of amyloidosis and HTN. Fistula 12/27/2015. -needs hep B vaccine series - CLIP to [facility] TTS 11:45,  HD again in AM and ? discharge 2. AL Amyloidosis - on ixazomib citrate  3. Volume overload, Improving 4. Dark urine, check UA   Subjective: Interval History: c/o dark urine  Objective: Vital signs in last 24 hours: Temp:  [98 F (36.7 C)-98.8 F (37.1 C)] 98.8 F (37.1 C) (11/23 0504) Pulse Rate:  [68-73] 68 (11/23 0952) Resp:  [15-20] 20 (11/23 0504) BP: (119-154)/(50-67) 129/52 (11/23 0952) SpO2:  [94 %-97 %] 96 % (11/23 0504) Weight:  [90.5 kg (199 lb 8 oz)-95.1 kg (209 lb 10.5 oz)] 90.5 kg (199 lb 8 oz) (11/23 0504) Weight change: 1.115 kg (2 lb 7.3 oz)  Intake/Output from previous day: 11/22 0701 - 11/23 0700 In: 470 [P.O.:360; IV Piggyback:110] Out: 4450 [Urine:450] Intake/Output this shift: Total I/O In: 480 [P.O.:480] Out: 0   General appearance: alert and cooperative Resp: clear to auscultation bilaterally Cardio: regular rate and rhythm, S1, S2 normal, no murmur, click, rub or gallop Extremities: edema improvining  Lab Results:  Recent Labs  01/31/16 0439 02/01/16 0351  WBC 5.6 5.2  HGB 8.6* 8.0*  HCT 28.0* 26.3*  PLT 92* 81*   BMET:  Recent Labs  01/31/16 0439 02/01/16 0352  NA 136 135  K 3.7 3.1*  CL 96* 97*  CO2 28 27  GLUCOSE 103* 116*  BUN 17 28*  CREATININE 3.43* 4.93*  CALCIUM 8.2* 8.2*   No results for input(s): PTH in the last 72 hours. Iron Studies: No results for input(s): IRON, TIBC, TRANSFERRIN, FERRITIN in the last 72 hours. Studies/Results: No results found.  Scheduled: . acyclovir  400 mg Oral Daily  . amLODipine  10 mg Oral  Daily  . aspirin EC  81 mg Oral Daily  . calcium acetate  667 mg Oral TID WC  . carvedilol  25 mg Oral BID WC  . cholecalciferol  3,000 Units Oral Daily  . [START ON 02/09/2016] darbepoetin (ARANESP) injection - DIALYSIS  100 mcg Intravenous Q Thu-HD  . [START ON 02/04/2016] ferric gluconate (FERRLECIT/NULECIT) IV  125 mg Intravenous Q T,Th,Sa-HD  . fluticasone  1 spray Each Nare Daily  . gabapentin  300 mg Oral QHS  . hydrALAZINE  100 mg Oral TID  . ixazomib citrate  3 mg Oral Q14 Days  . loratadine  10 mg Oral Daily  . sodium chloride  500 mL Intravenous Once  . sodium chloride flush  3 mL Intravenous Q12H  . vitamin B-12  50 mcg Oral Daily      LOS: 10 days   Jose Garrett C 02/02/2016,11:02 AM

## 2016-02-03 ENCOUNTER — Inpatient Hospital Stay (HOSPITAL_COMMUNITY): Payer: Self-pay

## 2016-02-03 ENCOUNTER — Encounter (HOSPITAL_COMMUNITY): Payer: Self-pay

## 2016-02-03 DIAGNOSIS — R609 Edema, unspecified: Secondary | ICD-10-CM

## 2016-02-03 LAB — RENAL FUNCTION PANEL
ALBUMIN: 3.5 g/dL (ref 3.5–5.0)
ANION GAP: 13 (ref 5–15)
ANION GAP: 11 (ref 5–15)
Albumin: 3.8 g/dL (ref 3.5–5.0)
BUN: 25 mg/dL — ABNORMAL HIGH (ref 6–20)
BUN: 14 mg/dL (ref 6–20)
CHLORIDE: 97 mmol/L — AB (ref 101–111)
CO2: 25 mmol/L (ref 22–32)
CO2: 27 mmol/L (ref 22–32)
Calcium: 8.5 mg/dL — ABNORMAL LOW (ref 8.9–10.3)
Calcium: 8.6 mg/dL — ABNORMAL LOW (ref 8.9–10.3)
Chloride: 98 mmol/L — ABNORMAL LOW (ref 101–111)
Creatinine, Ser: 4.65 mg/dL — ABNORMAL HIGH (ref 0.61–1.24)
Creatinine, Ser: 3.57 mg/dL — ABNORMAL HIGH (ref 0.61–1.24)
GFR calc non Af Amer: 17 mL/min — ABNORMAL LOW (ref >60)
GFR, EST AFRICAN AMERICAN: 14 mL/min — AB (ref >60)
GFR, EST AFRICAN AMERICAN: 19 mL/min — AB (ref >60)
GFR, EST NON AFRICAN AMERICAN: 12 mL/min — AB (ref >60)
Glucose, Bld: 123 mg/dL — ABNORMAL HIGH (ref 65–99)
Glucose, Bld: 113 mg/dL — ABNORMAL HIGH (ref 65–99)
PHOSPHORUS: 2.7 mg/dL (ref 2.5–4.6)
POTASSIUM: 3.3 mmol/L — AB (ref 3.5–5.1)
POTASSIUM: 4.1 mmol/L (ref 3.5–5.1)
Phosphorus: 1.8 mg/dL — ABNORMAL LOW (ref 2.5–4.6)
Sodium: 136 mmol/L (ref 135–145)
Sodium: 135 mmol/L (ref 135–145)

## 2016-02-03 LAB — CBC
HEMATOCRIT: 27.8 % — AB (ref 39.0–52.0)
HEMATOCRIT: 30.5 % — AB (ref 39.0–52.0)
HEMOGLOBIN: 8.9 g/dL — AB (ref 13.0–17.0)
HEMOGLOBIN: 9.7 g/dL — AB (ref 13.0–17.0)
MCH: 30 pg (ref 26.0–34.0)
MCH: 29.8 pg (ref 26.0–34.0)
MCHC: 32 g/dL (ref 30.0–36.0)
MCHC: 31.8 g/dL (ref 30.0–36.0)
MCV: 93.6 fL (ref 78.0–100.0)
MCV: 93.6 fL (ref 78.0–100.0)
Platelets: 105 10*3/uL — ABNORMAL LOW (ref 150–400)
Platelets: 119 10*3/uL — ABNORMAL LOW (ref 150–400)
RBC: 2.97 MIL/uL — ABNORMAL LOW (ref 4.22–5.81)
RBC: 3.26 MIL/uL — AB (ref 4.22–5.81)
RDW: 17.9 % — ABNORMAL HIGH (ref 11.5–15.5)
RDW: 17.8 % — ABNORMAL HIGH (ref 11.5–15.5)
WBC: 5.6 10*3/uL (ref 4.0–10.5)
WBC: 7.3 10*3/uL (ref 4.0–10.5)

## 2016-02-03 MED ORDER — HYDRALAZINE HCL 25 MG PO TABS: 25.0000 mg | ORAL_TABLET | Freq: Three times a day (TID) | ORAL | Status: DC

## 2016-02-03 MED ORDER — HEPARIN SODIUM (PORCINE) 1000 UNIT/ML DIALYSIS: 20.0000 [IU]/kg | INTRAMUSCULAR | Status: DC | PRN

## 2016-02-03 MED ORDER — HEPARIN SODIUM (PORCINE) 1000 UNIT/ML DIALYSIS
1000.0000 [IU] | INTRAMUSCULAR | Status: DC | PRN
Start: 2016-02-03 — End: 2016-02-04
  Filled 2016-02-03: qty 1

## 2016-02-03 MED ORDER — HEPARIN SODIUM (PORCINE) 1000 UNIT/ML DIALYSIS: 1000.0000 [IU] | INTRAMUSCULAR | Status: DC | PRN

## 2016-02-03 MED ORDER — SODIUM CHLORIDE 0.9 % IV SOLN: 100.0000 mL | INTRAVENOUS | Status: DC | PRN

## 2016-02-03 MED ORDER — LIDOCAINE-PRILOCAINE 2.5-2.5 % EX CREA: 1.0000 "application " | TOPICAL_CREAM | CUTANEOUS | Status: DC | PRN

## 2016-02-03 MED ORDER — HEPARIN SODIUM (PORCINE) 1000 UNIT/ML DIALYSIS
20.0000 [IU]/kg | INTRAMUSCULAR | Status: DC | PRN
  Filled 2016-02-03: qty 2

## 2016-02-03 MED ORDER — PENTAFLUOROPROP-TETRAFLUOROETH EX AERO: 1.0000 "application " | INHALATION_SPRAY | CUTANEOUS | Status: DC | PRN

## 2016-02-03 MED ORDER — LIDOCAINE-PRILOCAINE 2.5-2.5 % EX CREA
1.0000 "application " | TOPICAL_CREAM | CUTANEOUS | Status: DC | PRN
  Filled 2016-02-03: qty 5

## 2016-02-03 MED ORDER — LIDOCAINE HCL (PF) 1 % IJ SOLN: 5.0000 mL | INTRAMUSCULAR | Status: DC | PRN

## 2016-02-03 MED ORDER — AMLODIPINE BESYLATE 2.5 MG PO TABS: 2.5000 mg | ORAL_TABLET | Freq: Every day | ORAL | Status: DC

## 2016-02-03 MED ORDER — ALTEPLASE 2 MG IJ SOLR: 2.0000 mg | Freq: Once | INTRAMUSCULAR | Status: DC | PRN

## 2016-02-03 MED ORDER — HYDRALAZINE HCL 25 MG PO TABS
25.0000 mg | ORAL_TABLET | Freq: Two times a day (BID) | ORAL | Status: DC
  Administered 2016-02-03: 25 mg via ORAL
  Filled 2016-02-03: qty 1

## 2016-02-03 NOTE — Progress Notes (Signed)
PROGRESS NOTE  Jose Garrett NOB:[id number] DOB: January 07, 1951 DOA: 01/23/2016 PCP: [name], MD  Brief History: Jose Shovlin Pooleis a 65 y.o.malewith medical history significant of AL Amyloidosis, CKD stage 4-5. Patient follows with Dr. [name] for his CKD, and a right forearm fistula created on 12/27/2015 for anticipation of dialysis. Patient reported for the several weeks lower extremity edema was worsening but for the past 10 days got really worse, he has scrotal edema, has DOE and PND and orthopnea. His Lasix was increased recently from 40 to 80 mg by Dr. [name] but he continues to have worsening of his fluid status so he came into the ED for further management.  Assessment/Plan: Acute respiratory failure with hypoxia -secondary to pulmonary edema -weaned to RA -11/23--no desaturation with ambulation  Acute pulmonary edema -Patient presented with SOB, DOE, orthopnea and massive lower extremity edema edema. -CXR showed bilateral effusion and interstitial edema -secondary to progressive stage Vchronic kidney disease, with no significant improvement despite diuresis.  -Started on IV diuresis and metolazone with a urine output however no significant change in renal function. IV Lasix and metolazone were  changed to oral Demadex which has been d/ced as patient has been started on hemodialysis.  -Tunneled HD catheter was placed and patient undergoing hemodialysis, with first treatment on 01/27/2016 -Last HD 11/24  Progressive chronic kidney disease stage V >>>ESRD  -Patient has stage V CKD secondary nephrotic range disease from to biopsy-proven AL amyloidosis with likely progressive CKD V>>ESRD -Kidney biopsy done on 09/08/2014 showed AL amyloid.  -right forearm fistula created on 12/27/2015. -First HD on 01/27/2016 per nephrology.  -Nephrology following and appreciate input and recommendations.  Leg Edema, L>R leg -venous duplex r/o DVT--no  DVT  Acute on chronic diastolic CHF -9/47/65 Echo--LVEF is 60-65% and grade 2 diastolic dysfunction. -Acute pulmonary edema likely secondary to fluid overload from progressive chronic kidney disease. -Continue home medication including Coreg and hydralazine. - Patient was on IV Lasix and metolazone, has been transitioned to [facility] which has been d/ced when pt started on hemodialysis. -NEG 18.2 L with HD during the admission  AL amyloidosis -With renal involvement, patient was following with Dr. [name]. -Is on ixazomib citrate, was on dexamethasone as well for that. Resumed acyclovir.  Hypokalemia Secondary to diureses. Repleted.   Fever -developed 101.6 temp early am 01/31/16 -afebrile and hemodynamically stable since then -Chest x-ray negative for any acute infiltrate. -blood cultures neg to date -remain off abx for now  Hypertension -may have to reduce BP med with ongoing HD -continue coreg, amlodipine and hydralazine for now -decrease amlodipine to 2.5 mg daily and hydralazine to 25 mg bid  Thrombocytopenia -d/c heparin -SCDs      Disposition Plan: Home when cleared by renal--possible 11/25 Family Communication: NoFamily at bedside  Consultants: Nephrology  Code Status: FULL   DVT Prophylaxis: SCDs  Procedures: As Listed in Progress Note Above  Antibiotics: None    Subjective: Patient denies fevers, chills, headache, chest pain, dyspnea, nausea, vomiting, diarrhea, abdominal pain, dysuria, hematuria, hematochezia, and melena.   Objective: Vitals:   02/03/16 1000 02/03/16 1100 02/03/16 1130 02/03/16 1215  BP: 117/60 (!) 126/57 118/62 128/63  Pulse: 68 71 72 70  Resp:    17  Temp:    98.4 F (36.9 C)  TempSrc:    Oral  SpO2:    95%  Weight:    88.3 kg (194 lb 10.7 oz)  Height:  Intake/Output Summary (Last 24 hours) at 02/03/16 1844 Last data filed at 02/03/16 1215  Gross per 24 hour  Intake              240 ml   Output             4225 ml  Net            -3985 ml   Weight change: -3.972 kg (-8 lb 12.1 oz) Exam:   General:  Pt is alert, follows commands appropriately, not in acute distress  HEENT: No icterus, No thrush, No neck mass, [place]/AT  Cardiovascular: RRR, S1/S2, no rubs, no gallops  Respiratory: CTA bilaterally, no wheezing, no crackles, no rhonchi  Abdomen: Soft/+BS, non tender, non distended, no guarding  Extremities: trace LE edema, No lymphangitis, No petechiae, No rashes, no synovitis   Data Reviewed: I have personally reviewed following labs and imaging studies Basic Metabolic Panel:  Recent Labs Lab 01/29/16 0230 01/30/16 0312 01/31/16 0439 02/01/16 0352 02/03/16 0849  NA 138 134* 136 135 136  K 3.0* 3.1* 3.7 3.1* 3.3*  CL 102 97* 96* 97* 98*  CO2 26 28 28 27 25   GLUCOSE 108* 96 103* 116* 123*  BUN 39* 16 17 28* 25*  CREATININE 3.56* 2.66* 3.43* 4.93* 4.65*  CALCIUM 8.6* 8.3* 8.2* 8.2* 8.5*  PHOS 3.6 3.5 2.7 3.7 2.7   Liver Function Tests:  Recent Labs Lab 01/29/16 0230 01/30/16 0312 01/31/16 0439 02/01/16 0352 02/03/16 0849  ALBUMIN 3.5 3.3* 3.6 3.4* 3.5   No results for input(s): LIPASE, AMYLASE in the last 168 hours. No results for input(s): AMMONIA in the last 168 hours. Coagulation Profile: No results for input(s): INR, PROTIME in the last 168 hours. CBC:  Recent Labs Lab 01/28/16 0404 01/29/16 0230 01/31/16 0439 02/01/16 0351 02/03/16 0848  WBC 4.8 4.9 5.6 5.2 5.6  HGB 8.5* 8.2* 8.6* 8.0* 8.9*  HCT 26.3* 25.6* 28.0* 26.3* 27.8*  MCV 89.8 90.1 94.3 93.9 93.6  PLT 101* 81* 92* 81* 105*   Cardiac Enzymes: No results for input(s): CKTOTAL, CKMB, CKMBINDEX, TROPONINI in the last 168 hours. BNP: Invalid input(s): POCBNP CBG: No results for input(s): GLUCAP in the last 168 hours. HbA1C: No results for input(s): HGBA1C in the last 72 hours. Urine analysis:    Component Value Date/Time   COLORURINE AMBER (A) 02/01/2016 1707    APPEARANCEUR CLOUDY (A) 02/01/2016 1707   LABSPEC 1.020 02/01/2016 1707   PHURINE 5.5 02/01/2016 1707   GLUCOSEU 100 (A) 02/01/2016 1707   HGBUR NEGATIVE 02/01/2016 1707   BILIRUBINUR SMALL (A) 02/01/2016 1707   KETONESUR NEGATIVE 02/01/2016 1707   PROTEINUR >300 (A) 02/01/2016 1707   NITRITE NEGATIVE 02/01/2016 1707   LEUKOCYTESUR TRACE (A) 02/01/2016 1707   Sepsis Labs: @LABRCNTIP (procalcitonin:4,lacticidven:4) ) Recent Results (from the past 240 hour(s))  Culture, blood (Routine X 2) w Reflex to ID Panel     Status: None (Preliminary result)   Collection Time: 01/30/16 12:00 PM  Result Value Ref Range Status   Specimen Description HEMODIALYSIS CATHETER  Final   Special Requests BOTTLES DRAWN AEROBIC AND ANAEROBIC 10CC  Final   Culture NO GROWTH 4 DAYS  Final   Report Status PENDING  Incomplete  Culture, blood (Routine X 2) w Reflex to ID Panel     Status: None (Preliminary result)   Collection Time: 01/30/16 12:10 PM  Result Value Ref Range Status   Specimen Description HEMODIALYSIS CATHETER  Final   Special Requests BOTTLES DRAWN AEROBIC  AND ANAEROBIC 10CC  Final   Culture NO GROWTH 4 DAYS  Final   Report Status PENDING  Incomplete  Urine culture     Status: Abnormal (Preliminary result)   Collection Time: 02/01/16  5:07 PM  Result Value Ref Range Status   Specimen Description URINE, RANDOM  Final   Special Requests NONE  Final   Culture (A)  Final    10,000 COLONIES/mL PSEUDOMONAS AERUGINOSA 40,000 COLONIES/mL ENTEROBACTER AEROGENES SUSCEPTIBILITIES TO FOLLOW    Report Status PENDING  Incomplete     Scheduled Meds: . acyclovir  400 mg Oral Daily  . amLODipine  10 mg Oral Daily  . aspirin EC  81 mg Oral Daily  . calcium acetate  667 mg Oral TID WC  . carvedilol  25 mg Oral BID WC  . cholecalciferol  3,000 Units Oral Daily  . [START ON 02/09/2016] darbepoetin (ARANESP) injection - DIALYSIS  100 mcg Intravenous Q Thu-HD  . [START ON 02/04/2016] ferric gluconate  (FERRLECIT/NULECIT) IV  125 mg Intravenous Q T,Th,Sa-HD  . fluticasone  1 spray Each Nare Daily  . gabapentin  300 mg Oral QHS  . hydrALAZINE  25 mg Oral TID  . ixazomib citrate  3 mg Oral Q14 Days  . loratadine  10 mg Oral Daily  . sodium chloride  500 mL Intravenous Once  . sodium chloride flush  3 mL Intravenous Q12H  . vitamin B-12  50 mcg Oral Daily   Continuous Infusions:  Procedures/Studies: Dg Chest 2 View  Result Date: 01/30/2016 CLINICAL DATA:  Fever began today after dialysis, line placed 4 days ago, history COPD, hypertension, amyloidosis EXAM: CHEST  2 VIEW COMPARISON:  01/23/2016 FINDINGS: RIGHT jugular dual-lumen central venous catheter with tip projecting over RIGHT atrium. Enlargement of cardiac silhouette with pulmonary vascular congestion. Mediastinal contours normal. Chronic interstitial prominence diffusely in both lungs likely reflecting pulmonary edema. Small bibasilar pleural effusions and atelectasis. No definite focal consolidation or pneumothorax. No acute osseous findings. IMPRESSION: Probable mild CHF/fluid overload with small bibasilar pleural effusions and atelectasis. Electronically Signed   By: Lavonia Dana M.D.   On: 01/30/2016 17:46   Dg Chest 2 View  Result Date: 01/23/2016 CLINICAL DATA:  Increased shortness of breath over the past month. History of hypertension, CHF. EXAM: CHEST  2 VIEW COMPARISON:  PA and lateral chest x-ray of August 14, 2015 FINDINGS: The lungs are well-expanded. There small bilateral pleural effusions which are new. The interstitial markings are increased and more conspicuous than on the previous study. The heart is top-normal in size. The pulmonary vascularity is prominent centrally but stable. There is calcification in the wall of the aortic arch. The bony thorax exhibits no acute abnormality. IMPRESSION: New bilateral pleural effusions and mild interstitial edema consistent with CHF. This is superimposed upon chronic bronchitic change. No  alveolar pneumonia. Thoracic aortic atherosclerosis. Electronically Signed   By: Chryl Holten  [place] M.D.   On: 01/23/2016 14:00   Ir Fluoro Guide Cv Line Right  Result Date: 01/27/2016 CLINICAL DATA:  Renal failure, needs IV access for hemodialysis. EXAM: TUNNELED HEMODIALYSIS CATHETER PLACEMENT WITH ULTRASOUND AND FLUOROSCOPIC GUIDANCE TECHNIQUE: The procedure, risks, benefits, and alternatives were explained to the patient. Questions regarding the procedure were encouraged and answered. The patient understands and consents to the procedure. As antibiotic prophylaxis, cefazolin 2 g was ordered pre-procedure and administered intravenously within one hour of incision.Patency of the right IJ vein was confirmed with ultrasound with image documentation. An appropriate skin site was determined. Region was  prepped using maximum barrier technique including cap and mask, sterile gown, sterile gloves, large sterile sheet, and Chlorhexidine as cutaneous antisepsis. The region was infiltrated locally with 1% lidocaine. Intravenous Fentanyl and Versed were administered as conscious sedation during continuous monitoring of the patient's level of consciousness and physiological / cardiorespiratory status by the radiology RN, with a total moderate sedation time of 15 minutes. Under real-time ultrasound guidance, the right IJ vein was accessed with a 21 gauge micropuncture needle; the needle tip within the vein was confirmed with ultrasound image documentation. Needle exchanged over the 018 guidewire for transitional dilator, which allowed advancement of a Benson wire into the IVC. Over this, an MPA catheter was advanced. A Palindrome 23 hemodialysis catheter was tunneled from the right anterior chest wall approach to the right IJ dermatotomy site. The MPA catheter was exchanged over an Amplatz wire for serial vascular dilators which allow placement of a peel-away sheath, through which the catheter was advanced under intermittent  fluoroscopy, positioned with its tips in the proximal and midright atrium. Spot chest radiograph confirms good catheter position. No pneumothorax. Catheter was flushed and primed per protocol. Catheter secured externally with O Prolene sutures. The right IJ dermatotomy site was closed with Dermabond. COMPLICATIONS: COMPLICATIONS None immediate FLUOROSCOPY TIME:  18 seconds, 2 mGy COMPARISON:  None IMPRESSION: 1. Technically successful placement of tunneled right IJ hemodialysis catheter with ultrasound and fluoroscopic guidance. Ready for routine use. ACCESS: Remains approachable for percutaneous intervention as needed. Electronically Signed   By: Lucrezia Europe M.D.   On: 01/27/2016 16:18   Ir US Guide Vasc Access Right  Result Date: 01/27/2016 CLINICAL DATA:  Renal failure, needs IV access for hemodialysis. EXAM: TUNNELED HEMODIALYSIS CATHETER PLACEMENT WITH ULTRASOUND AND FLUOROSCOPIC GUIDANCE TECHNIQUE: The procedure, risks, benefits, and alternatives were explained to the patient. Questions regarding the procedure were encouraged and answered. The patient understands and consents to the procedure. As antibiotic prophylaxis, cefazolin 2 g was ordered pre-procedure and administered intravenously within one hour of incision.Patency of the right IJ vein was confirmed with ultrasound with image documentation. An appropriate skin site was determined. Region was prepped using maximum barrier technique including cap and mask, sterile gown, sterile gloves, large sterile sheet, and Chlorhexidine as cutaneous antisepsis. The region was infiltrated locally with 1% lidocaine. Intravenous Fentanyl and Versed were administered as conscious sedation during continuous monitoring of the patient's level of consciousness and physiological / cardiorespiratory status by the radiology RN, with a total moderate sedation time of 15 minutes. Under real-time ultrasound guidance, the right IJ vein was accessed with a 21 gauge  micropuncture needle; the needle tip within the vein was confirmed with ultrasound image documentation. Needle exchanged over the 018 guidewire for transitional dilator, which allowed advancement of a Benson wire into the IVC. Over this, an MPA catheter was advanced. A Palindrome 23 hemodialysis catheter was tunneled from the right anterior chest wall approach to the right IJ dermatotomy site. The MPA catheter was exchanged over an Amplatz wire for serial vascular dilators which allow placement of a peel-away sheath, through which the catheter was advanced under intermittent fluoroscopy, positioned with its tips in the proximal and midright atrium. Spot chest radiograph confirms good catheter position. No pneumothorax. Catheter was flushed and primed per protocol. Catheter secured externally with O Prolene sutures. The right IJ dermatotomy site was closed with Dermabond. COMPLICATIONS: COMPLICATIONS None immediate FLUOROSCOPY TIME:  18 seconds, 2 mGy COMPARISON:  None IMPRESSION: 1. Technically successful placement of tunneled right IJ hemodialysis catheter  with ultrasound and fluoroscopic guidance. Ready for routine use. ACCESS: Remains approachable for percutaneous intervention as needed. Electronically Signed   By: Lucrezia Europe M.D.   On: 01/27/2016 16:18    Maahi Lannan, DO  [facility] Pager [phone]  If 7PM-7AM, please contact night-coverage [email or web address] Password [facility] 02/03/2016, 6:44 PM   LOS: 11 days

## 2016-02-03 NOTE — Progress Notes (Signed)
Preliminary results by tech - Left Lower Ext. Venous Duplex Completed. Negative for deep and superficial vein thrombosis in the left leg. Coila Wardell, BS, RDMS, RVT  

## 2016-02-03 NOTE — Procedures (Signed)
[name] so far, goal net 4000cc.. Will need EDW in the 88 or lower range upon discharge.   Jose Garrett

## 2016-02-04 LAB — CULTURE, BLOOD (ROUTINE X 2)
CULTURE: NO GROWTH
CULTURE: NO GROWTH

## 2016-02-04 LAB — URINE CULTURE: Culture: 10000 — AB

## 2016-02-04 MED ORDER — HEPARIN SODIUM (PORCINE) 1000 UNIT/ML DIALYSIS: 20.0000 [IU]/kg | INTRAMUSCULAR | Status: DC | PRN

## 2016-02-04 MED ORDER — GABAPENTIN 250 MG/5ML PO SOLN: 300.0000 mg | Freq: Every day | ORAL | 0 refills | Status: DC

## 2016-02-04 MED ORDER — SODIUM CHLORIDE 0.9 % IV SOLN: 100.0000 mL | INTRAVENOUS | Status: DC | PRN

## 2016-02-04 MED ORDER — CALCIUM ACETATE (PHOS BINDER) 667 MG PO CAPS: 667.0000 mg | ORAL_CAPSULE | Freq: Three times a day (TID) | ORAL | 0 refills | Status: DC

## 2016-02-04 MED ORDER — HEPARIN SODIUM (PORCINE) 1000 UNIT/ML DIALYSIS: 1000.0000 [IU] | INTRAMUSCULAR | Status: DC | PRN

## 2016-02-04 MED ORDER — PENTAFLUOROPROP-TETRAFLUOROETH EX AERO: 1.0000 "application " | INHALATION_SPRAY | CUTANEOUS | Status: DC | PRN

## 2016-02-04 MED ORDER — AMLODIPINE BESYLATE 5 MG PO TABS: 5.0000 mg | ORAL_TABLET | Freq: Every day | ORAL | Status: DC

## 2016-02-04 MED ORDER — AMLODIPINE BESYLATE 5 MG PO TABS: 5.0000 mg | ORAL_TABLET | Freq: Every day | ORAL | 0 refills | Status: DC

## 2016-02-04 MED ORDER — LIDOCAINE-PRILOCAINE 2.5-2.5 % EX CREA: 1.0000 "application " | TOPICAL_CREAM | CUTANEOUS | Status: DC | PRN

## 2016-02-04 MED ORDER — AMLODIPINE BESYLATE 5 MG PO TABS
5.0000 mg | ORAL_TABLET | Freq: Every day | ORAL | 0 refills | Status: DC
Start: 2016-02-05 — End: 2016-02-04

## 2016-02-04 MED ORDER — LIDOCAINE HCL (PF) 1 % IJ SOLN: 5.0000 mL | INTRAMUSCULAR | Status: DC | PRN

## 2016-02-04 MED ORDER — ALTEPLASE 2 MG IJ SOLR: 2.0000 mg | Freq: Once | INTRAMUSCULAR | Status: DC | PRN

## 2016-02-04 NOTE — Discharge Summary (Addendum)
Physician Discharge Summary  EMRIK ERHARD ZOX:[id number] DOB: Feb 03, 1951 DOA: 01/23/2016  PCP: [name], MD  Admit date: 01/23/2016 Discharge date: 02/04/2016  Admitted From: home Disposition:  Home  Recommendations for Outpatient Follow-up:  1. Follow up with PCP in 1-2 weeks 2. Please obtain BMP/CBC in one week   Home Health: no Equipment/Devices:none  Discharge Condition:Stable CODE STATUS: FULL Diet recommendation: Heart Healthy   Brief/Interim Summary: Deondrick Searls Pooleis a 65 y.o.malewith medical history significant of AL Amyloidosis, CKD stage 4-5. Patient follows with Dr. [name] for his CKD, and a right forearm fistula created on 12/27/2015 for anticipation of dialysis. Patient reported for the several weeks lower extremity edema was worsening but for the past 10 days got really worse, he has scrotal edema, has DOE and PND and orthopnea. His Lasix was increased recently from 40 to 80 mg by Dr. [name] but he continues to have worsening of his fluid status so he came into the ED for further management.  Discharge Diagnoses:  Acute respiratory failure with hypoxia -secondary to pulmonary edema -weaned to RA -11/23--no desaturation with ambulation  Acute pulmonary edema -Patient presented with SOB, DOE, orthopnea and massive lower extremity edema edema. -initial CXR showed bilateral effusion and interstitial edema -secondary to progressive stage Vchronic kidney disease, with no significant improvement despite diuresis.  -Started on IV diuresis and metolazone with a urine output however no significant change in renal function. IV Lasix and metolazone werechanged to oral Demadex which has been d/ced as patient has been started on hemodialysis.  -Tunneled HD catheter was placed and patient undergoing hemodialysis, with first treatment on 01/27/2016 -Last HD 11/25 -outpatient HD at [facility] on 11/28  Progressive chronic kidney disease stage  V >>>ESRD  -Patient has stage V CKD secondary nephrotic range disease from to biopsy-proven AL amyloidosis with likely progressive CKD V>>ESRD -Kidney biopsy done on 09/08/2014 showed AL amyloid.  -right forearm fistula created on 12/27/2015. -First HD on 01/27/2016 per nephrology.  -Nephrology following and appreciate input and recommendations. -outpatient HD at [facility] on 11/28  Leg Edema, L>R leg -venous duplex r/o DVT--no DVT  Acute on chronic diastolic CHF -10/20/89 Echo--LVEF is 60-65% and grade 2 diastolic dysfunction. -Acute pulmonary edema likely secondary to fluid overload from progressive chronic kidney disease. -Continue home medication including Coreg and hydralazine. - Patient was on IV Lasix and metolazone which was transitioned to [facility] which has been d/ced when pt started on hemodialysis. -NEG 18.2L with HD during the admission -discharge weight 194  AL amyloidosis -With renal involvement, patient was following with Dr. [name]. -Is on ixazomib citrate, was on dexamethasone as well for that. Resumed acyclovir.  Hypokalemia Repleted.   Fever -developed 101.6 temp early am 01/31/16 -afebrile and hemodynamically stable since then -Chest x-ray negative for any acute infiltrate. -blood cultures neg to date -remain off abx   Hypertension -may have to reduce BP med with ongoing HD -continue coreg, amlodipine  -decrease amlodipine to 5 mg daily and d/c hydralazine per renal  Thrombocytopenia -d/c heparin -SCDs  Bacteruria -unclear clinical significance -pt is asymptomatic -no pyuria -remains afebrile and stable   Discharge Instructions  Discharge Instructions    Diet - low sodium heart healthy    Complete by:  As directed    Increase activity slowly    Complete by:  As directed        Medication List    STOP taking these medications   furosemide 80 MG tablet Commonly known as:  LASIX  hydrALAZINE 100 MG tablet Commonly known as:   APRESOLINE   oxyCODONE 5 MG immediate release tablet Commonly known as:  Oxy IR/ROXICODONE     TAKE these medications   acetaminophen 325 MG tablet Commonly known as:  TYLENOL Take 325-650 mg by mouth every 6 (six) hours as needed (back pain).   acyclovir 400 MG tablet Commonly known as:  ZOVIRAX TAKE 1 TABLET BY MOUTH DAILY. (DOSE ADJUSTED FOR RENAL FUNCTION)   amLODipine 5 MG tablet Commonly known as:  NORVASC Take 1 tablet (5 mg total) by mouth daily. Start taking on:  02/05/2016 What changed:  medication strength  how much to take   aspirin EC 81 MG tablet Take 1 tablet (81 mg total) by mouth daily.   calcium acetate 667 MG capsule Commonly known as:  PHOSLO Take 1 capsule (667 mg total) by mouth 3 (three) times daily with meals.   calcium carbonate 500 MG chewable tablet Commonly known as:  TUMS - dosed in mg elemental calcium Chew 1-2 tablets by mouth 2 (two) times daily as needed for indigestion or heartburn.   carvedilol 25 MG tablet Commonly known as:  COREG Take 1 tablet (25 mg total) by mouth 2 (two) times daily with a meal.   cetirizine 10 MG tablet Commonly known as:  ZYRTEC Take 10 mg by mouth daily. What changed:  Another medication with the same name was removed. Continue taking this medication, and follow the directions you see here.   FLONASE 50 MCG/ACT nasal spray Generic drug:  fluticasone Place 1 spray into both nostrils daily as needed. What changed:  reasons to take this   gabapentin 250 MG/5ML solution Commonly known as:  NEURONTIN Take 6 mLs (300 mg total) by mouth at bedtime. What changed:  how much to take   ixazomib citrate 3 MG capsule Commonly known as:  NINLARO Take 1 capsule (3 mg total) by mouth once a week. (on D1,8 and 15 every 28 days)Take on an empty stomach 1hr before or 2hrs after food. Do not crush, chew, or open. What changed:  when to take this  additional instructions   LORazepam 1 MG tablet Commonly known  as:  ATIVAN Take 1 mg by mouth daily as needed for anxiety (nausea).   ondansetron 8 MG tablet Commonly known as:  ZOFRAN Take 8 mg by mouth every 8 (eight) hours as needed for nausea or vomiting. Reported on 07/08/2015   PROCRIT IJ Inject as directed every 14 (fourteen) days. Done at [facility] Last injection 01/19/16   Vitamin B-12 1000 MCG/15ML Liqd Take 1 mL by mouth daily.   Vitamin D3 3000 units Tabs Take 3,000 Units by mouth daily.       Allergies  Allergen Reactions  . No Known Allergies     Consultations:  nephrology   Procedures/Studies: Dg Chest 2 View  Result Date: 01/30/2016 CLINICAL DATA:  Fever began today after dialysis, line placed 4 days ago, history COPD, hypertension, amyloidosis EXAM: CHEST  2 VIEW COMPARISON:  01/23/2016 FINDINGS: RIGHT jugular dual-lumen central venous catheter with tip projecting over RIGHT atrium. Enlargement of cardiac silhouette with pulmonary vascular congestion. Mediastinal contours normal. Chronic interstitial prominence diffusely in both lungs likely reflecting pulmonary edema. Small bibasilar pleural effusions and atelectasis. No definite focal consolidation or pneumothorax. No acute osseous findings. IMPRESSION: Probable mild CHF/fluid overload with small bibasilar pleural effusions and atelectasis. Electronically Signed   By: Lavonia Dana M.D.   On: 01/30/2016 17:46   Dg Chest  2 View  Result Date: 01/23/2016 CLINICAL DATA:  Increased shortness of breath over the past month. History of hypertension, CHF. EXAM: CHEST  2 VIEW COMPARISON:  PA and lateral chest x-ray of August 14, 2015 FINDINGS: The lungs are well-expanded. There small bilateral pleural effusions which are new. The interstitial markings are increased and more conspicuous than on the previous study. The heart is top-normal in size. The pulmonary vascularity is prominent centrally but stable. There is calcification in the wall of the aortic arch. The bony thorax  exhibits no acute abnormality. IMPRESSION: New bilateral pleural effusions and mild interstitial edema consistent with CHF. This is superimposed upon chronic bronchitic change. No alveolar pneumonia. Thoracic aortic atherosclerosis. Electronically Signed   By: Christinna Sprung  [place] M.D.   On: 01/23/2016 14:00   Ir Fluoro Guide Cv Line Right  Result Date: 01/27/2016 CLINICAL DATA:  Renal failure, needs IV access for hemodialysis. EXAM: TUNNELED HEMODIALYSIS CATHETER PLACEMENT WITH ULTRASOUND AND FLUOROSCOPIC GUIDANCE TECHNIQUE: The procedure, risks, benefits, and alternatives were explained to the patient. Questions regarding the procedure were encouraged and answered. The patient understands and consents to the procedure. As antibiotic prophylaxis, cefazolin 2 g was ordered pre-procedure and administered intravenously within one hour of incision.Patency of the right IJ vein was confirmed with ultrasound with image documentation. An appropriate skin site was determined. Region was prepped using maximum barrier technique including cap and mask, sterile gown, sterile gloves, large sterile sheet, and Chlorhexidine as cutaneous antisepsis. The region was infiltrated locally with 1% lidocaine. Intravenous Fentanyl and Versed were administered as conscious sedation during continuous monitoring of the patient's level of consciousness and physiological / cardiorespiratory status by the radiology RN, with a total moderate sedation time of 15 minutes. Under real-time ultrasound guidance, the right IJ vein was accessed with a 21 gauge micropuncture needle; the needle tip within the vein was confirmed with ultrasound image documentation. Needle exchanged over the 018 guidewire for transitional dilator, which allowed advancement of a Benson wire into the IVC. Over this, an MPA catheter was advanced. A Palindrome 23 hemodialysis catheter was tunneled from the right anterior chest wall approach to the right IJ dermatotomy site. The  MPA catheter was exchanged over an Amplatz wire for serial vascular dilators which allow placement of a peel-away sheath, through which the catheter was advanced under intermittent fluoroscopy, positioned with its tips in the proximal and midright atrium. Spot chest radiograph confirms good catheter position. No pneumothorax. Catheter was flushed and primed per protocol. Catheter secured externally with O Prolene sutures. The right IJ dermatotomy site was closed with Dermabond. COMPLICATIONS: COMPLICATIONS None immediate FLUOROSCOPY TIME:  18 seconds, 2 mGy COMPARISON:  None IMPRESSION: 1. Technically successful placement of tunneled right IJ hemodialysis catheter with ultrasound and fluoroscopic guidance. Ready for routine use. ACCESS: Remains approachable for percutaneous intervention as needed. Electronically Signed   By: Lucrezia Europe M.D.   On: 01/27/2016 16:18   Ir US Guide Vasc Access Right  Result Date: 01/27/2016 CLINICAL DATA:  Renal failure, needs IV access for hemodialysis. EXAM: TUNNELED HEMODIALYSIS CATHETER PLACEMENT WITH ULTRASOUND AND FLUOROSCOPIC GUIDANCE TECHNIQUE: The procedure, risks, benefits, and alternatives were explained to the patient. Questions regarding the procedure were encouraged and answered. The patient understands and consents to the procedure. As antibiotic prophylaxis, cefazolin 2 g was ordered pre-procedure and administered intravenously within one hour of incision.Patency of the right IJ vein was confirmed with ultrasound with image documentation. An appropriate skin site was determined. Region was prepped using maximum barrier technique  including cap and mask, sterile gown, sterile gloves, large sterile sheet, and Chlorhexidine as cutaneous antisepsis. The region was infiltrated locally with 1% lidocaine. Intravenous Fentanyl and Versed were administered as conscious sedation during continuous monitoring of the patient's level of consciousness and physiological /  cardiorespiratory status by the radiology RN, with a total moderate sedation time of 15 minutes. Under real-time ultrasound guidance, the right IJ vein was accessed with a 21 gauge micropuncture needle; the needle tip within the vein was confirmed with ultrasound image documentation. Needle exchanged over the 018 guidewire for transitional dilator, which allowed advancement of a Benson wire into the IVC. Over this, an MPA catheter was advanced. A Palindrome 23 hemodialysis catheter was tunneled from the right anterior chest wall approach to the right IJ dermatotomy site. The MPA catheter was exchanged over an Amplatz wire for serial vascular dilators which allow placement of a peel-away sheath, through which the catheter was advanced under intermittent fluoroscopy, positioned with its tips in the proximal and midright atrium. Spot chest radiograph confirms good catheter position. No pneumothorax. Catheter was flushed and primed per protocol. Catheter secured externally with O Prolene sutures. The right IJ dermatotomy site was closed with Dermabond. COMPLICATIONS: COMPLICATIONS None immediate FLUOROSCOPY TIME:  18 seconds, 2 mGy COMPARISON:  None IMPRESSION: 1. Technically successful placement of tunneled right IJ hemodialysis catheter with ultrasound and fluoroscopic guidance. Ready for routine use. ACCESS: Remains approachable for percutaneous intervention as needed. Electronically Signed   By: Lucrezia Europe M.D.   On: 01/27/2016 16:18        Discharge Exam: Vitals:   02/03/16 2026 02/04/16 0628  BP: (!) 118/54 134/63  Pulse: 80 72  Resp: 18 18  Temp: 98.6 F (37 C) 98.3 F (36.8 C)   Vitals:   02/03/16 1130 02/03/16 1215 02/03/16 2026 02/04/16 0628  BP: 118/62 128/63 (!) 118/54 134/63  Pulse: 72 70 80 72  Resp:  17 18 18   Temp:  98.4 F (36.9 C) 98.6 F (37 C) 98.3 F (36.8 C)  TempSrc:  Oral Oral Oral  SpO2:  95% 95% 98%  Weight:  88.3 kg (194 lb 10.7 oz)  88 kg (194 lb 1.6 oz)  Height:         General: Pt is alert, awake, not in acute distress Cardiovascular: RRR, S1/S2 +, no rubs, no gallops Respiratory: CTA bilaterally, no wheezing, no rhonchi Abdominal: Soft, NT, ND, bowel sounds + Extremities: no edema, no cyanosis   The results of significant diagnostics from this hospitalization (including imaging, microbiology, ancillary and laboratory) are listed below for reference.    Significant Diagnostic Studies: Dg Chest 2 View  Result Date: 01/30/2016 CLINICAL DATA:  Fever began today after dialysis, line placed 4 days ago, history COPD, hypertension, amyloidosis EXAM: CHEST  2 VIEW COMPARISON:  01/23/2016 FINDINGS: RIGHT jugular dual-lumen central venous catheter with tip projecting over RIGHT atrium. Enlargement of cardiac silhouette with pulmonary vascular congestion. Mediastinal contours normal. Chronic interstitial prominence diffusely in both lungs likely reflecting pulmonary edema. Small bibasilar pleural effusions and atelectasis. No definite focal consolidation or pneumothorax. No acute osseous findings. IMPRESSION: Probable mild CHF/fluid overload with small bibasilar pleural effusions and atelectasis. Electronically Signed   By: Lavonia Dana M.D.   On: 01/30/2016 17:46   Dg Chest 2 View  Result Date: 01/23/2016 CLINICAL DATA:  Increased shortness of breath over the past month. History of hypertension, CHF. EXAM: CHEST  2 VIEW COMPARISON:  PA and lateral chest x-ray of August 14, 2015  FINDINGS: The lungs are well-expanded. There small bilateral pleural effusions which are new. The interstitial markings are increased and more conspicuous than on the previous study. The heart is top-normal in size. The pulmonary vascularity is prominent centrally but stable. There is calcification in the wall of the aortic arch. The bony thorax exhibits no acute abnormality. IMPRESSION: New bilateral pleural effusions and mild interstitial edema consistent with CHF. This is superimposed upon  chronic bronchitic change. No alveolar pneumonia. Thoracic aortic atherosclerosis. Electronically Signed   By: Sumaya Riedesel  [place] M.D.   On: 01/23/2016 14:00   Ir Fluoro Guide Cv Line Right  Result Date: 01/27/2016 CLINICAL DATA:  Renal failure, needs IV access for hemodialysis. EXAM: TUNNELED HEMODIALYSIS CATHETER PLACEMENT WITH ULTRASOUND AND FLUOROSCOPIC GUIDANCE TECHNIQUE: The procedure, risks, benefits, and alternatives were explained to the patient. Questions regarding the procedure were encouraged and answered. The patient understands and consents to the procedure. As antibiotic prophylaxis, cefazolin 2 g was ordered pre-procedure and administered intravenously within one hour of incision.Patency of the right IJ vein was confirmed with ultrasound with image documentation. An appropriate skin site was determined. Region was prepped using maximum barrier technique including cap and mask, sterile gown, sterile gloves, large sterile sheet, and Chlorhexidine as cutaneous antisepsis. The region was infiltrated locally with 1% lidocaine. Intravenous Fentanyl and Versed were administered as conscious sedation during continuous monitoring of the patient's level of consciousness and physiological / cardiorespiratory status by the radiology RN, with a total moderate sedation time of 15 minutes. Under real-time ultrasound guidance, the right IJ vein was accessed with a 21 gauge micropuncture needle; the needle tip within the vein was confirmed with ultrasound image documentation. Needle exchanged over the 018 guidewire for transitional dilator, which allowed advancement of a Benson wire into the IVC. Over this, an MPA catheter was advanced. A Palindrome 23 hemodialysis catheter was tunneled from the right anterior chest wall approach to the right IJ dermatotomy site. The MPA catheter was exchanged over an Amplatz wire for serial vascular dilators which allow placement of a peel-away sheath, through which the catheter was  advanced under intermittent fluoroscopy, positioned with its tips in the proximal and midright atrium. Spot chest radiograph confirms good catheter position. No pneumothorax. Catheter was flushed and primed per protocol. Catheter secured externally with O Prolene sutures. The right IJ dermatotomy site was closed with Dermabond. COMPLICATIONS: COMPLICATIONS None immediate FLUOROSCOPY TIME:  18 seconds, 2 mGy COMPARISON:  None IMPRESSION: 1. Technically successful placement of tunneled right IJ hemodialysis catheter with ultrasound and fluoroscopic guidance. Ready for routine use. ACCESS: Remains approachable for percutaneous intervention as needed. Electronically Signed   By: Lucrezia Europe M.D.   On: 01/27/2016 16:18   Ir US Guide Vasc Access Right  Result Date: 01/27/2016 CLINICAL DATA:  Renal failure, needs IV access for hemodialysis. EXAM: TUNNELED HEMODIALYSIS CATHETER PLACEMENT WITH ULTRASOUND AND FLUOROSCOPIC GUIDANCE TECHNIQUE: The procedure, risks, benefits, and alternatives were explained to the patient. Questions regarding the procedure were encouraged and answered. The patient understands and consents to the procedure. As antibiotic prophylaxis, cefazolin 2 g was ordered pre-procedure and administered intravenously within one hour of incision.Patency of the right IJ vein was confirmed with ultrasound with image documentation. An appropriate skin site was determined. Region was prepped using maximum barrier technique including cap and mask, sterile gown, sterile gloves, large sterile sheet, and Chlorhexidine as cutaneous antisepsis. The region was infiltrated locally with 1% lidocaine. Intravenous Fentanyl and Versed were administered as conscious sedation during continuous monitoring of  the patient's level of consciousness and physiological / cardiorespiratory status by the radiology RN, with a total moderate sedation time of 15 minutes. Under real-time ultrasound guidance, the right IJ vein was accessed  with a 21 gauge micropuncture needle; the needle tip within the vein was confirmed with ultrasound image documentation. Needle exchanged over the 018 guidewire for transitional dilator, which allowed advancement of a Benson wire into the IVC. Over this, an MPA catheter was advanced. A Palindrome 23 hemodialysis catheter was tunneled from the right anterior chest wall approach to the right IJ dermatotomy site. The MPA catheter was exchanged over an Amplatz wire for serial vascular dilators which allow placement of a peel-away sheath, through which the catheter was advanced under intermittent fluoroscopy, positioned with its tips in the proximal and midright atrium. Spot chest radiograph confirms good catheter position. No pneumothorax. Catheter was flushed and primed per protocol. Catheter secured externally with O Prolene sutures. The right IJ dermatotomy site was closed with Dermabond. COMPLICATIONS: COMPLICATIONS None immediate FLUOROSCOPY TIME:  18 seconds, 2 mGy COMPARISON:  None IMPRESSION: 1. Technically successful placement of tunneled right IJ hemodialysis catheter with ultrasound and fluoroscopic guidance. Ready for routine use. ACCESS: Remains approachable for percutaneous intervention as needed. Electronically Signed   By: Lucrezia Europe M.D.   On: 01/27/2016 16:18     Microbiology: Recent Results (from the past 240 hour(s))  Culture, blood (Routine X 2) w Reflex to ID Panel     Status: None (Preliminary result)   Collection Time: 01/30/16 12:00 PM  Result Value Ref Range Status   Specimen Description HEMODIALYSIS CATHETER  Final   Special Requests BOTTLES DRAWN AEROBIC AND ANAEROBIC 10CC  Final   Culture NO GROWTH 4 DAYS  Final   Report Status PENDING  Incomplete  Culture, blood (Routine X 2) w Reflex to ID Panel     Status: None (Preliminary result)   Collection Time: 01/30/16 12:10 PM  Result Value Ref Range Status   Specimen Description HEMODIALYSIS CATHETER  Final   Special Requests  BOTTLES DRAWN AEROBIC AND ANAEROBIC 10CC  Final   Culture NO GROWTH 4 DAYS  Final   Report Status PENDING  Incomplete  Urine culture     Status: Abnormal   Collection Time: 02/01/16  5:07 PM  Result Value Ref Range Status   Specimen Description URINE, RANDOM  Final   Special Requests NONE  Final   Culture (A)  Final    10,000 COLONIES/mL PSEUDOMONAS AERUGINOSA 40,000 COLONIES/mL ENTEROBACTER AEROGENES    Report Status 02/04/2016 FINAL  Final   Organism ID, Bacteria PSEUDOMONAS AERUGINOSA (A)  Final   Organism ID, Bacteria ENTEROBACTER AEROGENES (A)  Final      Susceptibility   Enterobacter aerogenes - MIC*    CEFAZOLIN >=64 RESISTANT Resistant     CEFTRIAXONE <=1 SENSITIVE Sensitive     CIPROFLOXACIN <=0.25 SENSITIVE Sensitive     GENTAMICIN <=1 SENSITIVE Sensitive     IMIPENEM 2 SENSITIVE Sensitive     NITROFURANTOIN 64 INTERMEDIATE Intermediate     TRIMETH/SULFA <=20 SENSITIVE Sensitive     PIP/TAZO <=4 SENSITIVE Sensitive     * 40,000 COLONIES/mL ENTEROBACTER AEROGENES   Pseudomonas aeruginosa - MIC*    CEFTAZIDIME <=1 SENSITIVE Sensitive     CIPROFLOXACIN <=0.25 SENSITIVE Sensitive     GENTAMICIN 2 SENSITIVE Sensitive     IMIPENEM 2 SENSITIVE Sensitive     PIP/TAZO <=4 SENSITIVE Sensitive     CEFEPIME <=1 SENSITIVE Sensitive     *  10,000 COLONIES/mL PSEUDOMONAS AERUGINOSA     Labs: Basic Metabolic Panel:  Recent Labs Lab 01/30/16 0312 01/31/16 0439 02/01/16 0352 02/03/16 0849 02/03/16 2018  NA 134* 136 135 136 135  K 3.1* 3.7 3.1* 3.3* 4.1  CL 97* 96* 97* 98* 97*  CO2 28 28 27 25 27   GLUCOSE 96 103* 116* 123* 113*  BUN 16 17 28* 25* 14  CREATININE 2.66* 3.43* 4.93* 4.65* 3.57*  CALCIUM 8.3* 8.2* 8.2* 8.5* 8.6*  PHOS 3.5 2.7 3.7 2.7 1.8*   Liver Function Tests:  Recent Labs Lab 01/30/16 0312 01/31/16 0439 02/01/16 0352 02/03/16 0849 02/03/16 2018  ALBUMIN 3.3* 3.6 3.4* 3.5 3.8   No results for input(s): LIPASE, AMYLASE in the last 168 hours. No  results for input(s): AMMONIA in the last 168 hours. CBC:  Recent Labs Lab 01/29/16 0230 01/31/16 0439 02/01/16 0351 02/03/16 0848 02/03/16 2018  WBC 4.9 5.6 5.2 5.6 7.3  HGB 8.2* 8.6* 8.0* 8.9* 9.7*  HCT 25.6* 28.0* 26.3* 27.8* 30.5*  MCV 90.1 94.3 93.9 93.6 93.6  PLT 81* 92* 81* 105* 119*   Cardiac Enzymes: No results for input(s): CKTOTAL, CKMB, CKMBINDEX, TROPONINI in the last 168 hours. BNP: Invalid input(s): POCBNP CBG: No results for input(s): GLUCAP in the last 168 hours.  Time coordinating discharge:  Greater than 30 minutes  Signed:  Jasha Hodzic, DO [facility] Pager: [phone] 02/04/2016, 11:09 AM

## 2016-02-04 NOTE — Progress Notes (Signed)
Assessment/ Plan:   Background: 65 year old gentleman with history of amyloidosis, hypertension, CKD stage 5, chronic diastolic heart failure presenting with acute volume overload.   1. Progressive CKD (CKD stage 5)-->ESRD: Nephrologist Dr. [name].  Cardiorenal syndrome in the setting of amyloidosis and HTN. Fistula 12/27/2015. -CLIP to [facility] TTS 11:45,  HD again today to get on schedule(short treatment) and discharge 2. AL Amyloidosis - on ixazomib citrate  3. Volume overload, Improved  Subjective: Interval History: Weight is down  Objective: Vital signs in last 24 hours: Temp:  [98.3 F (36.8 C)-98.6 F (37 C)] 98.3 F (36.8 C) (11/25 0628) Pulse Rate:  [68-80] 72 (11/25 0628) Resp:  [17-18] 18 (11/25 0628) BP: (117-134)/(54-63) 134/63 (11/25 0628) SpO2:  [95 %-98 %] 98 % (11/25 0628) Weight:  [88 kg (194 lb 1.6 oz)-88.3 kg (194 lb 10.7 oz)] 88 kg (194 lb 1.6 oz) (11/25 0628) Weight change: 1.572 kg (3 lb 7.5 oz)  Intake/Output from previous day: 11/24 0701 - 11/25 0700 In: 3 [I.V.:3] Out: 4000  Intake/Output this shift: Total I/O In: 240 [P.O.:240] Out: -   General appearance: alert and cooperative Resp: clear to auscultation bilaterally Chest wall: no tenderness Cardio: regular rate and rhythm, S1, S2 normal, no murmur, click, rub or gallop Extremities: now wrinkles  Lab Results:  Recent Labs  02/03/16 0848 02/03/16 2018  WBC 5.6 7.3  HGB 8.9* 9.7*  HCT 27.8* 30.5*  PLT 105* 119*   BMET:  Recent Labs  02/03/16 0849 02/03/16 2018  NA 136 135  K 3.3* 4.1  CL 98* 97*  CO2 25 27  GLUCOSE 123* 113*  BUN 25* 14  CREATININE 4.65* 3.57*  CALCIUM 8.5* 8.6*   No results for input(s): PTH in the last 72 hours. Iron Studies: No results for input(s): IRON, TIBC, TRANSFERRIN, FERRITIN in the last 72 hours. Studies/Results: No results found.  Scheduled: . acyclovir  400 mg Oral Daily  . amLODipine  5 mg Oral Daily  . aspirin EC  81 mg Oral  Daily  . calcium acetate  667 mg Oral TID WC  . carvedilol  25 mg Oral BID WC  . cholecalciferol  3,000 Units Oral Daily  . [START ON 02/09/2016] darbepoetin (ARANESP) injection - DIALYSIS  100 mcg Intravenous Q Thu-HD  . ferric gluconate (FERRLECIT/NULECIT) IV  125 mg Intravenous Q T,Th,Sa-HD  . fluticasone  1 spray Each Nare Daily  . gabapentin  300 mg Oral QHS  . ixazomib citrate  3 mg Oral Q14 Days  . loratadine  10 mg Oral Daily  . sodium chloride  500 mL Intravenous Once  . sodium chloride flush  3 mL Intravenous Q12H  . vitamin B-12  50 mcg Oral Daily    LOS: 12 days   Sherrelle Prochazka C 02/04/2016,9:30 AM

## 2016-02-06 ENCOUNTER — Other Ambulatory Visit: Payer: Self-pay | Admitting: *Deleted

## 2016-02-06 ENCOUNTER — Encounter: Payer: Self-pay | Admitting: Hematology

## 2016-02-06 ENCOUNTER — Telehealth: Payer: Self-pay | Admitting: Hematology

## 2016-02-06 ENCOUNTER — Ambulatory Visit (HOSPITAL_BASED_OUTPATIENT_CLINIC_OR_DEPARTMENT_OTHER): Payer: Self-pay | Admitting: Hematology

## 2016-02-06 ENCOUNTER — Other Ambulatory Visit (HOSPITAL_BASED_OUTPATIENT_CLINIC_OR_DEPARTMENT_OTHER): Payer: Self-pay

## 2016-02-06 VITALS — BP 130/52 | HR 66 | Temp 98.3°F | Resp 18 | Ht 70.0 in | Wt 198.8 lb

## 2016-02-06 DIAGNOSIS — D688 Other specified coagulation defects: Secondary | ICD-10-CM | POA: Insufficient documentation

## 2016-02-06 DIAGNOSIS — N189 Chronic kidney disease, unspecified: Secondary | ICD-10-CM

## 2016-02-06 DIAGNOSIS — Z4931 Encounter for adequacy testing for hemodialysis: Secondary | ICD-10-CM | POA: Insufficient documentation

## 2016-02-06 DIAGNOSIS — E876 Hypokalemia: Secondary | ICD-10-CM | POA: Insufficient documentation

## 2016-02-06 DIAGNOSIS — R197 Diarrhea, unspecified: Secondary | ICD-10-CM | POA: Insufficient documentation

## 2016-02-06 DIAGNOSIS — N2581 Secondary hyperparathyroidism of renal origin: Secondary | ICD-10-CM | POA: Insufficient documentation

## 2016-02-06 DIAGNOSIS — L299 Pruritus, unspecified: Secondary | ICD-10-CM | POA: Insufficient documentation

## 2016-02-06 DIAGNOSIS — T829XXA Unspecified complication of cardiac and vascular prosthetic device, implant and graft, initial encounter: Secondary | ICD-10-CM | POA: Insufficient documentation

## 2016-02-06 DIAGNOSIS — R609 Edema, unspecified: Secondary | ICD-10-CM

## 2016-02-06 DIAGNOSIS — C9000 Multiple myeloma not having achieved remission: Secondary | ICD-10-CM

## 2016-02-06 DIAGNOSIS — E8581 Light chain (AL) amyloidosis: Secondary | ICD-10-CM

## 2016-02-06 DIAGNOSIS — R200 Anesthesia of skin: Secondary | ICD-10-CM

## 2016-02-06 DIAGNOSIS — Z992 Dependence on renal dialysis: Secondary | ICD-10-CM

## 2016-02-06 DIAGNOSIS — E46 Unspecified protein-calorie malnutrition: Secondary | ICD-10-CM | POA: Insufficient documentation

## 2016-02-06 DIAGNOSIS — I129 Hypertensive chronic kidney disease with stage 1 through stage 4 chronic kidney disease, or unspecified chronic kidney disease: Secondary | ICD-10-CM

## 2016-02-06 DIAGNOSIS — D631 Anemia in chronic kidney disease: Secondary | ICD-10-CM

## 2016-02-06 DIAGNOSIS — D696 Thrombocytopenia, unspecified: Secondary | ICD-10-CM

## 2016-02-06 DIAGNOSIS — D509 Iron deficiency anemia, unspecified: Secondary | ICD-10-CM

## 2016-02-06 LAB — COMPREHENSIVE METABOLIC PANEL
ALT: 22 U/L (ref 0–55)
ANION GAP: 13 meq/L — AB (ref 3–11)
AST: 29 U/L (ref 5–34)
Albumin: 3.8 g/dL (ref 3.5–5.0)
Alkaline Phosphatase: 87 U/L (ref 40–150)
BUN: 35.2 mg/dL — AB (ref 7.0–26.0)
CHLORIDE: 98 meq/L (ref 98–109)
CO2: 25 meq/L (ref 22–29)
CREATININE: 5.9 mg/dL — AB (ref 0.7–1.3)
Calcium: 9.7 mg/dL (ref 8.4–10.4)
EGFR: 9 mL/min/{1.73_m2} — ABNORMAL LOW (ref >90)
Glucose: 128 mg/dl (ref 70–140)
Potassium: 4.2 mEq/L (ref 3.5–5.1)
Sodium: 136 mEq/L (ref 136–145)
Total Bilirubin: 0.63 mg/dL (ref 0.20–1.20)
Total Protein: 7.1 g/dL (ref 6.4–8.3)

## 2016-02-06 LAB — CBC & DIFF AND RETIC
BASO%: 0.5 % (ref 0.0–2.0)
Basophils Absolute: 0 10*3/uL (ref 0.0–0.1)
EOS%: 10.7 % — AB (ref 0.0–7.0)
Eosinophils Absolute: 0.7 10*3/uL — ABNORMAL HIGH (ref 0.0–0.5)
HCT: 32.8 % — ABNORMAL LOW (ref 38.4–49.9)
HGB: 10.4 g/dL — ABNORMAL LOW (ref 13.0–17.1)
Immature Retic Fract: 13.6 % — ABNORMAL HIGH (ref 3.00–10.60)
LYMPH#: 0.9 10*3/uL (ref 0.9–3.3)
LYMPH%: 14.1 % (ref 14.0–49.0)
MCH: 29.5 pg (ref 27.2–33.4)
MCHC: 31.7 g/dL — AB (ref 32.0–36.0)
MCV: 92.9 fL (ref 79.3–98.0)
MONO#: 0.9 10*3/uL (ref 0.1–0.9)
MONO%: 14.1 % — ABNORMAL HIGH (ref 0.0–14.0)
NEUT#: 4 10*3/uL (ref 1.5–6.5)
NEUT%: 60.6 % (ref 39.0–75.0)
Platelets: 104 10*3/uL — ABNORMAL LOW (ref 140–400)
RBC: 3.53 10*6/uL — AB (ref 4.20–5.82)
RDW: 17.7 % — AB (ref 11.0–14.6)
RETIC %: 4.37 % — AB (ref 0.80–1.80)
RETIC CT ABS: 154.26 10*3/uL — AB (ref 34.80–93.90)
WBC: 6.7 10*3/uL (ref 4.0–10.3)

## 2016-02-06 MED ORDER — ACYCLOVIR 400 MG PO TABS: ORAL_TABLET | ORAL | 3 refills | Status: DC

## 2016-02-06 MED ORDER — LORAZEPAM 1 MG PO TABS: 1.0000 mg | ORAL_TABLET | Freq: Every day | ORAL | 0 refills | Status: DC | PRN

## 2016-02-06 NOTE — Telephone Encounter (Signed)
Appointments scheduled per 02/06/16 los. A copy of the  AVS report and appointment schedule was given to patient,per 02/06/16 los.  °

## 2016-02-08 ENCOUNTER — Ambulatory Visit (HOSPITAL_COMMUNITY): Admission: RE | Admit: 2016-02-08 | Payer: No Typology Code available for payment source | Source: Ambulatory Visit

## 2016-02-08 ENCOUNTER — Encounter: Payer: Self-pay | Admitting: Vascular Surgery

## 2016-02-08 ENCOUNTER — Ambulatory Visit: Payer: Self-pay | Admitting: Vascular Surgery

## 2016-02-14 ENCOUNTER — Ambulatory Visit (HOSPITAL_COMMUNITY)
Admission: RE | Admit: 2016-02-14 | Discharge: 2016-02-14 | Disposition: A | Discharge status: Home / Self Care | Payer: Self-pay | Source: Ambulatory Visit | Attending: Vascular Surgery | Admitting: Vascular Surgery

## 2016-02-14 DIAGNOSIS — Z4931 Encounter for adequacy testing for hemodialysis: Secondary | ICD-10-CM | POA: Insufficient documentation

## 2016-02-14 DIAGNOSIS — N186 End stage renal disease: Secondary | ICD-10-CM | POA: Insufficient documentation

## 2016-02-15 ENCOUNTER — Ambulatory Visit (INDEPENDENT_AMBULATORY_CARE_PROVIDER_SITE_OTHER): Payer: Self-pay | Admitting: Vascular Surgery

## 2016-02-15 ENCOUNTER — Ambulatory Visit: Payer: Self-pay | Admitting: Vascular Surgery

## 2016-02-15 ENCOUNTER — Encounter: Payer: Self-pay | Admitting: Vascular Surgery

## 2016-02-15 VITALS — BP 148/70 | HR 74 | Temp 98.1°F | Resp 18 | Ht 70.0 in | Wt 193.0 lb

## 2016-02-15 DIAGNOSIS — N183 Chronic kidney disease, stage 3 unspecified: Secondary | ICD-10-CM

## 2016-02-15 NOTE — Progress Notes (Signed)
Vitals:   02/15/16 0923  BP: (!) 148/67  Pulse: 74  Resp: 18  Temp: 98.1 F (36.7 C)  SpO2: 98%  Weight: 193 lb (87.5 kg)  Height: 5\' 10"  (1.778 m)

## 2016-02-15 NOTE — Progress Notes (Signed)
Patient name: Jose Garrett MRN: [id number] DOB: 1950/05/27 Sex: male  REASON FOR VISIT: Follow up after right radial cephalic AV fistula  HPI: Jose Garrett is a 65 y.o. male who is not yet on dialysis. He had a right radiocephalic AV fistula placed on 12/27/2015. He comes in for a routine follow up visit.  He ended up being hospitalized in November and had to begin dialysis. He has a right IJ tunneled dialysis catheter. He denies any pain or paresthesias in his right upper extremity.  Current Outpatient Prescriptions  Medication Sig Dispense Refill  . acetaminophen (TYLENOL) 325 MG tablet Take 325-650 mg by mouth every 6 (six) hours as needed (back pain).    [name] acyclovir (ZOVIRAX) 400 MG tablet TAKE 1 TABLET BY MOUTH DAILY. (DOSE ADJUSTED FOR RENAL FUNCTION) 60 tablet 3  . amLODipine (NORVASC) 5 MG tablet Take 1 tablet (5 mg total) by mouth daily. 30 tablet 0  . aspirin EC 81 MG tablet Take 1 tablet (81 mg total) by mouth daily. 30 tablet 11  . calcium acetate (PHOSLO) 667 MG capsule Take 1 capsule (667 mg total) by mouth 3 (three) times daily with meals. 90 capsule 0  . calcium carbonate (TUMS - DOSED IN MG ELEMENTAL CALCIUM) 500 MG chewable tablet Chew 1-2 tablets by mouth 2 (two) times daily as needed for indigestion or heartburn.     . carvedilol (COREG) 25 MG tablet Take 1 tablet (25 mg total) by mouth 2 (two) times daily with a meal. 60 tablet 5  . cetirizine (ZYRTEC) 10 MG tablet Take 10 mg by mouth daily.    . Cholecalciferol (VITAMIN D3) 3000 units TABS Take 3,000 Units by mouth daily.    . Cyanocobalamin (VITAMIN B-12) 1000 MCG/15ML LIQD Take 1 mL by mouth daily.    [name] Epoetin Alfa (PROCRIT IJ) Inject as directed every 14 (fourteen) days. Done at [facility] Last injection 01/19/16    . FLONASE 50 MCG/ACT nasal spray Place 1 spray into both nostrils daily as needed. (Patient taking differently: Place 1 spray into both nostrils daily as needed for allergies or rhinitis. )      . gabapentin (NEURONTIN) 250 MG/5ML solution Take 6 mLs (300 mg total) by mouth at bedtime. 470 mL 0  . ixazomib citrate (NINLARO) 3 MG capsule Take 1 capsule (3 mg total) by mouth once a week. (on D1,8 and 15 every 28 days)Take on an empty stomach 1hr before or 2hrs after food. Do not crush, chew, or open. (Patient taking differently: Take 3 mg by mouth every 14 (fourteen) days. Every other Thursday) 6 capsule 2  . LORazepam (ATIVAN) 1 MG tablet Take 1 tablet (1 mg total) by mouth daily as needed for anxiety (nausea). 30 tablet 0  . ondansetron (ZOFRAN) 8 MG tablet Take 8 mg by mouth every 8 (eight) hours as needed for nausea or vomiting. Reported on 07/08/2015     No current facility-administered medications for this visit.     REVIEW OF SYSTEMS:  [X]  denotes positive finding, [ ]  denotes negative finding Cardiac  Comments:  Chest pain or chest pressure:    Shortness of breath upon exertion:    Short of breath when lying flat:    Irregular heart rhythm:    Constitutional    Fever or chills:      PHYSICAL EXAM: Vitals:   02/15/16 0923 02/15/16 0925  BP: (!) 148/67 (!) 148/70  Pulse: 74 74  Resp: 18   Temp:  98.1 F (36.7 C)   SpO2: 98%   Weight: 193 lb (87.5 kg)   Height: 5\' 10"  (1.778 m)     GENERAL: The patient is a well-nourished male, in no acute distress. The vital signs are documented above. CARDIOVASCULAR: There is a regular rate and rhythm. PULMONARY: There is good air exchange bilaterally without wheezing or rales. His right forearm fistula has an excellent thrill and is good-sized new the risks but not adequate in size in the mid and upper forearm.  DUPLEX OF RIGHT RADIOCEPHALIC AV FISTULA: I have reviewed the duplex of the right radiocephalic AV fistula that was done on 02/14/2016. This shows that the diameters of the fistula range from 0.41-0.49 cm. No significant areas of stenosis are identified. Prostatic would probably do is I cannot see them on in the office  today so I cannot come to the intracondylar so I would discontinue the VAC and do wet and then I can see him in the office I VIN and the on-call yesterday in the office today's one cannot see him in the next 2 to Thursday and Friday crazy sunken in and out there thank you  MEDICAL ISSUES:  STATUS POST RIGHT RADIOCEPHALIC AV FISTULA: He is approximately 6 weeks out from his right radiocephalic AV fistula. This appears to be gradually maturing although currently it is not adequate in size. I ordered a follow duplex scan in 2 months and I'll see him back at that time. Hopefully by then the vein will be ready for use and he can have his catheter removed. He knows to call sooner if he has problems.  Deitra [facility] [phone]

## 2016-02-16 NOTE — Addendum Note (Signed)
Addended by: Lianne Cure A on: 02/16/2016 09:54 AM   Modules accepted: Orders

## 2016-02-23 NOTE — Progress Notes (Signed)
.    Hematology oncology clinic note  Date of service  .12/20/2015  Patient Care Team: Jose Nearing, MD as PCP - General (Family Medicine) Jose Genera, MD as PCP - Hematology/Oncology (Hematology and Oncology) Jose Parish, MD as Consulting Physician (Nephrology)  CHIEF COMPLAINTS: followup for AL amyloidosis.  HISTORY OF PRESENTING ILLNESS: Please see my initial consultation for details on presentation.  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 AL amyloidosis noted on kidney biopsy diagnosed 09/28/2014.  Had about 15 g of protein per 24 hours which produced a less than 50% suggesting renal response to treatment. - no overt heart failure   Current treatment Ixazomib maintainence   previous treatment  CyBorD x 6 cycles Evaluated for and declined Auto HSCT at [facility]. - maintenance Velcade  Ixazomib + Dexamethasone s/p 6 cycles  INTERVAL HISTORY  [name] is here for his scheduled follow-up for AL amyloidosis. He has completed 6 cycles of Ixazomib + Dexamethasone and was switched to maintenance Ixazomib D1,8,15 q28days.  Notes that his neuropathy does not appear any worse and is controlled with his gabapentin. Leg swelling better. Notes that he is scheduled for AV fistula placement on 10/70/2017. No other acute new symptoms.  MEDICAL HISTORY:  Past Medical History:  Diagnosis Date  . Amyloidosis ([place])   . Anemia Dx 2016  . Chronic diastolic CHF (congestive heart failure), NYHA class 1 ([place])    /hospital problem list 09/08/2014  . Chronic renal disease, stage III    /hospital problem list 09/08/2014  . Diarrhea   . Dyspnea    with exertion  . Enlarged heart 2015  . Family history of adverse reaction to anesthesia    "my daughter can't take certain anesthesia agents" (09/08/2014)  . GERD (gastroesophageal reflux disease)   . History of blood transfusion 09/08/2014   "got hematoma after  renal biopsy & HgB dropped"  . Hyperlipidemia Dx 2012  . Hypertension Dx 2012  . Renal disorder    "there is a spot on my kidney" (09/08/2014)  . Restless legs     SURGICAL HISTORY: Past Surgical History:  Procedure Laterality Date  . AV FISTULA PLACEMENT Right 12/27/2015   Procedure: Right Arm ARTERIOVENOUS (AV) FISTULA CREATION;  Surgeon: Angelia Mould, MD;  Location: [facility];  Service: Vascular;  Laterality: Right;  . COLONOSCOPY    . IR GENERIC HISTORICAL  01/27/2016   IR FLUORO GUIDE CV LINE RIGHT 01/27/2016 Arne Cleveland, MD [facility]INTER[facility]  . IR GENERIC HISTORICAL  01/27/2016   IR US GUIDE VASC ACCESS RIGHT 01/27/2016 Arne Cleveland, MD [facility]INTER[facility]  . LAPAROSCOPIC CHOLECYSTECTOMY  03/2011  . RENAL BIOPSY, PERCUTANEOUS Right 09/08/2014  . TONSILLECTOMY  ~ 1960    SOCIAL HISTORY: Social History   Social History  . Marital status: Divorced    Spouse name: N/A  . Number of children: N/A  . Years of education: N/A   Occupational History  . Not on file.   Social History Main Topics  . Smoking status: Former Smoker    Packs/day: 2.00    Years: 40.00    Types: Cigarettes    Quit date: 03/19/2011  . Smokeless tobacco: Never Used  . Alcohol use No  . Drug use: No     Comment: 09/08/2014 "I have used marijuana till the 1990's". Notes that he quit 15 yrs ago  . Sexual activity: Not Currently   Other Topics Concern  . Not on file  Social History Narrative  . 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 Lives with his daughter and her finance.  FAMILY HISTORY: Family History  Problem Relation Age of Onset  . Hypertension Mother   . Diabetes Mother   . Heart disease Mother   . Skin cancer Mother   . Hypertension Father   . Diabetes Father   . Diabetes Sister     ALLERGIES:  is allergic to no known allergies.  MEDICATIONS:  Current Outpatient Prescriptions  Medication Sig Dispense Refill  . aspirin EC 81  MG tablet Take 1 tablet (81 mg total) by mouth daily. 30 tablet 11  . carvedilol (COREG) 25 MG tablet Take 1 tablet (25 mg total) by mouth 2 (two) times daily with a meal. 60 tablet 5  . Epoetin Alfa (PROCRIT IJ) Inject as directed every 14 (fourteen) days. Done at [facility] Last injection 01/19/16    . ixazomib citrate (NINLARO) 3 MG capsule Take 1 capsule (3 mg total) by mouth once a week. (on D1,8 and 15 every 28 days)Take on an empty stomach 1hr before or 2hrs after food. Do not crush, chew, or open. (Patient taking differently: Take 3 mg by mouth every 14 (fourteen) days. Every other Thursday) 6 capsule 2  . ondansetron (ZOFRAN) 8 MG tablet Take 8 mg by mouth every 8 (eight) hours as needed for nausea or vomiting. Reported on 07/08/2015    . acetaminophen (TYLENOL) 325 MG tablet Take 325-650 mg by mouth every 6 (six) hours as needed (back pain).    [name] acyclovir (ZOVIRAX) 400 MG tablet TAKE 1 TABLET BY MOUTH DAILY. (DOSE ADJUSTED FOR RENAL FUNCTION) 60 tablet 3  . amLODipine (NORVASC) 5 MG tablet Take 1 tablet (5 mg total) by mouth daily. 30 tablet 0  . calcium acetate (PHOSLO) 667 MG capsule Take 1 capsule (667 mg total) by mouth 3 (three) times daily with meals. 90 capsule 0  . calcium carbonate (TUMS - DOSED IN MG ELEMENTAL CALCIUM) 500 MG chewable tablet Chew 1-2 tablets by mouth 2 (two) times daily as needed for indigestion or heartburn.     . cetirizine (ZYRTEC) 10 MG tablet Take 10 mg by mouth daily.    . Cholecalciferol (VITAMIN D3) 3000 units TABS Take 3,000 Units by mouth daily.    . Cyanocobalamin (VITAMIN B-12) 1000 MCG/15ML LIQD Take 1 mL by mouth daily.    [name] FLONASE 50 MCG/ACT nasal spray Place 1 spray into both nostrils daily as needed. (Patient taking differently: Place 1 spray into both nostrils daily as needed for allergies or rhinitis. )    . gabapentin (NEURONTIN) 250 MG/5ML solution Take 6 mLs (300 mg total) by mouth at bedtime. 470 mL 0  . LORazepam (ATIVAN) 1 MG tablet  Take 1 tablet (1 mg total) by mouth daily as needed for anxiety (nausea). 30 tablet 0   No current facility-administered medications for this visit.     REVIEW OF SYSTEMS:   All other systems were reviewed with the patient and are negative.   PHYSICAL EXAMINATION: ECOG PERFORMANCE STATUS: 2 - Symptomatic, <50% confined to bed  Vitals:   12/20/15 1028  BP: (!) 143/54  Pulse: 72  Resp: 19  Temp: 97.9 F (36.6 C)   Filed Weights   12/20/15 1028  Weight: 213 lb 11.2 oz (96.9 kg)    GENERAL:alert, Mild this is due to sinus congestion and sore throat and cough. SKIN: Ecchymosis on  upper extremities. EYES: nl EOM, PERL OROPHARYNX: No JVD No cervical adenopathy.  NECK: no JVD LYMPH:  no palpable lymphadenopathy in the cervical, axillary or inguinal LUNGS: clear to auscultation and percussion with normal breathing effort HEART: regular rate & rhythm and 2/6 SM aortic area ABDOMEN:abdomen soft, non-tender and normal bowel sounds Musculoskeletal: B/l trace pitting pedal edema No Tenderness. No redness.  PSYCH: alert & oriented x 3 with fluent speech NEURO: no focal motor/sensory deficits  LABORATORY DATA:  Component     Latest Ref Rng & Units 12/20/2015  WBC     4.0 - 10.3 10e3/uL 6.8  NEUT#     1.5 - 6.5 10e3/uL 4.9  Hemoglobin     13.0 - 17.1 g/dL 9.6 (L)  HCT     38.4 - 49.9 % 29.4 (L)  Platelets     140 - 400 10e3/uL 110 (L)  MCV     79.3 - 98.0 fL 88.8  MCH     27.2 - 33.4 pg 29.0  MCHC     32.0 - 36.0 g/dL 32.7  RBC     4.20 - 5.82 10e6/uL 3.31 (L)  RDW     11.0 - 14.6 % 17.1 (H)  lymph#     0.9 - 3.3 10e3/uL 0.9  MONO#     0.1 - 0.9 10e3/uL 0.6  Eosinophils Absolute     0.0 - 0.5 10e3/uL 0.3  Basophils Absolute     0.0 - 0.1 10e3/uL 0.0  NEUT%     39.0 - 75.0 % 72.3  LYMPH%     14.0 - 49.0 % 13.8 (L)  MONO%     0.0 - 14.0 % 8.4  EOS%     0.0 - 7.0 % 4.9  BASO%     0.0 - 2.0 % 0.6  Retic %     0.80 - 1.80 % 2.54 (H)  Retic Ct Abs     34.80  - 93.90 10e3/uL 84.07  Immature Retic Fract     3.00 - 10.60 % 15.80 (H)  Sodium     136 - 145 mEq/L 141  Potassium     3.5 - 5.1 mEq/L 3.8  Chloride     98 - 109 mEq/L 112 (H)  CO2     22 - 29 mEq/L 16 (L)  Glucose     70 - 140 mg/dl 184 (H)  BUN     7.0 - 26.0 mg/dL 39.4 (H)  Creatinine     0.7 - 1.3 mg/dL 4.4 (HH)  Total Bilirubin     0.20 - 1.20 mg/dL 0.40  Alkaline Phosphatase     40 - 150 U/L 66  AST     5 - 34 U/L 12  ALT     0 - 55 U/L 9  Total Protein     6.4 - 8.3 g/dL 6.5  Albumin     3.5 - 5.0 g/dL 3.4 (L)  Calcium     8.4 - 10.4 mg/dL 8.9  Anion gap     3 - 11 mEq/L 13 (H)  EGFR     >90 ml/min/1.73 m2 13 (L)  Ferritin     22 - 316 ng/ml 335 (H)         RADIOGRAPHIC STUDIES: I have personally reviewed the radiological images as listed and agreed with the findings in the report.  . *[facility] [employer]                        [place], [place] [address]                            [phone]  ------------------------------------------------------------------- Transthoracic Echocardiography  Patient:    Tahjae, Clausing MR #:       [id number] Study Date: 12/06/2015 Gender:     M Age:        31 Height:     175.3 cm Weight:     94.8 kg BSA:        2.18 m^2 Pt. Status: Room:   SONOGRAPHER  [place], Outpatient  ATTENDING    [place], [place], Gautam Kishore  REFERRING    [place], [place] Kishore  cc:  ------------------------------------------------------------------- LV EF: 65% -   70%  ------------------------------------------------------------------- History:   PMH:  Former Smoker, Anemia, Mild Aortic Stenosis. Patient with AL Amyloidosis for followup to r/o Cardiac Amyloidosis.  Risk factors:  Hypertension. Dyslipidemia.  ------------------------------------------------------------------- Study  Conclusions  - Left ventricle: The cavity size was normal. There was severe   concentric hypertrophy. Systolic function was vigorous. The   estimated ejection fraction was in the range of 65% to 70%. Wall   motion was normal; there were no regional wall motion   abnormalities. Features are consistent with a pseudonormal left   ventricular filling pattern, with concomitant abnormal relaxation   and increased filling pressure (grade 2 diastolic dysfunction).   Doppler parameters are consistent with elevated ventricular   end-diastolic filling pressure. - Aortic valve: Trileaflet; moderately thickened, moderately   calcified leaflets. Transvalvular velocity was minimally   increased. There was mild stenosis. There was no regurgitation. - Left atrium: The atrium was mildly dilated. - Right atrium: The atrium was normal in size. - Tricuspid valve: There was no regurgitation. - Pulmonary arteries: Systolic pressure was within the normal   range. - Inferior vena cava: The vessel was normal in size. - Pericardium, extracardiac: There was no pericardial effusion.  Impressions:  - There is no significant change when compared to the prior study   from 06/10/2015.    ASSESSMENT & PLAN:    Mr Jose Garrett is very pleasant 65 yo caucasian male with   1) AL Amyloidosis noted on renal biopsy with nephrotic range proteinuria on urinalysis and some lambda free light chains on serum and urinary IFE . On diagnosis:  SPEP with small amount of M-spike 0.3  Echo showed no systolic dysfunction but grade 2 diastolic dysfunction. [facility] cardiac staging 1 as per troponin and BNP criteria. Bone marrow with no overt evidence of multiple myeloma. PET/CT scan showed no evidence of bony lesions or lymphadenopathy . Urine showed about 15 g of protein in 24 hours   Patient is status post CyBorD x 6 cycles He was then on maintenance Velcade and had progression of his AL Amyloidosis.  He was then switched  to Ixazomib and Dexamethasone and has completed 6 cycles without significant toxicities. His UPEP shows about 15 g of proteins per 24 hours but no M spike.  SPEP from 11/22/2015 shows no M spike. Serum kappa lambda free light chains  difference less than 4 Suggesting continued hematologic complete response.  SPEP and SFLC from 12/20/2015 - shows no M spike and normal Lambda ratio. Upep 12/02/2015- 15.88G O total protein with no M spike Echo repeated on 12/06/2015 shows no changes in heart function compared to previous echo from March 2017  2) Thrombocytopenia mild likely from the Ixazomib. Patient is already on renally adjusted dose of the medication.  Plan -- No overt limiting toxicity from Ixazomib  --Given persistent hematologic response we'll continue Ixazomib maintenance day 1, 8, 15 every 28 days --continue on low-dose aspirin for VTE prophylaxis given his high risk of venous thromboembolism in the setting of nephrotic syndrome.  -continue Acyclovir for shingles prophylaxis. -Echocardiogram noted and remains unchanged. We'll continue Echo Q6 months -Close monitoring with nephrologist  2) multifactorial anemia - likely multifactorial -from AL amyloidosis, anemia from CKD. Hemoglobin is improved at 9.6 and has predominantly due to his significant chronic kidney disease Ferritin levels are now adequate at 335 B12 WNL -Previously deficient  Plan - No indication for PRBC transfusion at this time -Continue Procrit as per nephrology. -continue B12 replacement SL   3) CKD -related to hypertension and AL Amyloidosis. Continue f/u with nephrology  -He is scheduled for a fistula placement on 12/27/2015    4) Feet tingling and numbness ? Related to AL Amyloidosis vs Velcade vs B12 deficiency stable. Started taking gabapentin 12/16/2014. Much improved symptoms. No evidence of change in symptoms. Will need to monitor for neuropathy with his Ixazomib.  5)Bilateral pedal edema much improved.  Ultrasound of bilateral lower extremity is negative for DVT.  Plan -lasix per PCP/nephrology. -Continue SL B12 -Continue close f/u with PCP and nephrology (Dr [name]). -Patient's CKD might progress despite hematologic response to treatment.   Return to care with Dr. [name] in 4 weeks with cbc, cmp and Myeloma panel SFLC  All questions were answered. The patient knows to call the clinic with any problems, questions or concerns. I spent 25 minutes counseling the patient face to face. The total time spent in the appointment was 25 minutes and more than 50% was on counseling.   Sullivan Lone MD [place] [facility]/On[facility] Physician [facility]  (Office):       [phone] (Work cell):  [phone] (Fax):           [phone]

## 2016-02-23 NOTE — Progress Notes (Signed)
.    Hematology oncology clinic note  Date of service  .02/06/2016  Patient Care Team: [name], MD as PCP - General (Family Medicine) [name], MD as PCP - Hematology/Oncology (Hematology and Oncology) [name], MD as Consulting Physician (Nephrology)  CHIEF COMPLAINTS: followup for AL amyloidosis.  HISTORY OF PRESENTING ILLNESS: Please see my initial consultation for details on presentation.  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 AL amyloidosis noted on kidney biopsy diagnosed 09/28/2014.  Had about 15 g of protein per 24 hours which produced a less than 50% suggesting renal response to treatment. - no overt heart failure   Current treatment Ixazomib maintainence   previous treatment  CyBorD x 6 cycles Evaluated for and declined Auto HSCT at [facility]. - maintenance Velcade  Ixazomib + Dexamethasone s/p 6 cycles  INTERVAL HISTORY  [name] is here for his scheduled follow-up for AL amyloidosis.  He had his AV fistula placed on 12/27/2015 and was subsequently admitted to the hospital with fluid overload with shortness of breath and significant pedal edema and weight gain.  He had a tunneled catheter placed for dialysis and has been started on hemodialysis.  He notes that  His weight has come down from 227 pounds to 198.8 pounds with fluid removal and his breathing has improved significantly. He continues to follow with her nephrologist.  No change in  Lower extremity neuropathy though he is more comfortable due to resolution of his leg swelling.  He gets dialysis  Tuesday Thursday Saturday.  We discussed  Switching the Ixazomib to every other week for maintenance  And to help minimize impact with neuropathy.   MEDICAL HISTORY:  Past Medical History:  Diagnosis Date  . Amyloidosis ([place])   . Anemia Dx 2016  . Chronic diastolic CHF (congestive heart failure), NYHA class 1 ([place])    /hospital problem list 09/08/2014  . Chronic renal disease, stage III    /hospital problem list 09/08/2014  . Diarrhea   . Dyspnea    with exertion  . Enlarged heart 2015  . Family history of adverse reaction to anesthesia    "my daughter can't take certain anesthesia agents" (09/08/2014)  . GERD (gastroesophageal reflux disease)   . History of blood transfusion 09/08/2014   "got hematoma after renal biopsy & HgB dropped"  . Hyperlipidemia Dx 2012  . Hypertension Dx 2012  . Renal disorder    "there is a spot on my kidney" (09/08/2014)  . Restless legs     SURGICAL HISTORY: Past Surgical History:  Procedure Laterality Date  . AV FISTULA PLACEMENT Right 12/27/2015   Procedure: Right Arm ARTERIOVENOUS (AV) FISTULA CREATION;  Surgeon: Angelia Mould, MD;  Location: [facility];  Service: Vascular;  Laterality: Right;  . COLONOSCOPY    . IR GENERIC HISTORICAL  01/27/2016   IR FLUORO GUIDE CV LINE RIGHT 01/27/2016 Arne Cleveland, MD [facility]  . IR GENERIC HISTORICAL  01/27/2016   IR US GUIDE VASC ACCESS RIGHT 01/27/2016 Arne Cleveland, MD [facility]  . LAPAROSCOPIC CHOLECYSTECTOMY  03/2011  . RENAL BIOPSY, PERCUTANEOUS Right 09/08/2014  . TONSILLECTOMY  ~ 1960    SOCIAL HISTORY: Social History   Social History  . Marital status: Divorced    Spouse name: N/A  . Number of children: N/A  . Years of education: N/A   Occupational History  . Not on file.   Social History Main Topics  . Smoking status: Former Smoker  Packs/day: 2.00    Years: 40.00    Types: Cigarettes    Quit date: 03/19/2011  . Smokeless tobacco: Never Used  . Alcohol use No  . Drug use: No     Comment: 09/08/2014 "I have used marijuana till the 1990's". Notes that he quit 15 yrs ago  . Sexual activity: Not Currently   Other Topics Concern  . Not on file   Social History Narrative  . 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 Lives with his  daughter and her finance.  FAMILY HISTORY: Family History  Problem Relation Age of Onset  . Hypertension Mother   . Diabetes Mother   . Heart disease Mother   . Skin cancer Mother   . Hypertension Father   . Diabetes Father   . Diabetes Sister     ALLERGIES:  is allergic to no known allergies.  MEDICATIONS:  Current Outpatient Prescriptions  Medication Sig Dispense Refill  . acetaminophen (TYLENOL) 325 MG tablet Take 325-650 mg by mouth every 6 (six) hours as needed (back pain).    [name] acyclovir (ZOVIRAX) 400 MG tablet TAKE 1 TABLET BY MOUTH DAILY. (DOSE ADJUSTED FOR RENAL FUNCTION) 60 tablet 3  . amLODipine (NORVASC) 5 MG tablet Take 1 tablet (5 mg total) by mouth daily. 30 tablet 0  . aspirin EC 81 MG tablet Take 1 tablet (81 mg total) by mouth daily. 30 tablet 11  . calcium acetate (PHOSLO) 667 MG capsule Take 1 capsule (667 mg total) by mouth 3 (three) times daily with meals. 90 capsule 0  . calcium carbonate (TUMS - DOSED IN MG ELEMENTAL CALCIUM) 500 MG chewable tablet Chew 1-2 tablets by mouth 2 (two) times daily as needed for indigestion or heartburn.     . carvedilol (COREG) 25 MG tablet Take 1 tablet (25 mg total) by mouth 2 (two) times daily with a meal. 60 tablet 5  . cetirizine (ZYRTEC) 10 MG tablet Take 10 mg by mouth daily.    . Cholecalciferol (VITAMIN D3) 3000 units TABS Take 3,000 Units by mouth daily.    . Cyanocobalamin (VITAMIN B-12) 1000 MCG/15ML LIQD Take 1 mL by mouth daily.    [name] Epoetin Alfa (PROCRIT IJ) Inject as directed every 14 (fourteen) days. Done at [facility] Last injection 01/19/16    . FLONASE 50 MCG/ACT nasal spray Place 1 spray into both nostrils daily as needed. (Patient taking differently: Place 1 spray into both nostrils daily as needed for allergies or rhinitis. )    . gabapentin (NEURONTIN) 250 MG/5ML solution Take 6 mLs (300 mg total) by mouth at bedtime. 470 mL 0  . ixazomib citrate (NINLARO) 3 MG capsule Take 1 capsule (3 mg total) by  mouth once a week. (on D1,8 and 15 every 28 days)Take on an empty stomach 1hr before or 2hrs after food. Do not crush, chew, or open. (Patient taking differently: Take 3 mg by mouth every 14 (fourteen) days. Every other Thursday) 6 capsule 2  . LORazepam (ATIVAN) 1 MG tablet Take 1 tablet (1 mg total) by mouth daily as needed for anxiety (nausea). 30 tablet 0  . ondansetron (ZOFRAN) 8 MG tablet Take 8 mg by mouth every 8 (eight) hours as needed for nausea or vomiting. Reported on 07/08/2015     No current facility-administered medications for this visit.     REVIEW OF SYSTEMS:   All other systems were reviewed with the patient and are  negative.   PHYSICAL EXAMINATION: ECOG PERFORMANCE STATUS: 2 - Symptomatic, <50% confined to bed  Vitals:   02/06/16 1134  BP: (!) 130/52  Pulse: 66  Resp: 18  Temp: 98.3 F (36.8 C)   Filed Weights   02/06/16 1134  Weight: 198 lb 12.8 oz (90.2 kg)    GENERAL:alert, Mild this is due to sinus congestion and sore throat and cough. SKIN: Ecchymosis on upper extremities. EYES: nl EOM, PERL OROPHARYNX: No JVD No cervical adenopathy.  NECK: no JVD LYMPH:  no palpable lymphadenopathy in the cervical, axillary or inguinal LUNGS: clear to auscultation and percussion with normal breathing effort HEART: regular rate & rhythm and 2/6 SM aortic area ABDOMEN:abdomen soft, non-tender and normal bowel sounds Musculoskeletal: B/l trace pitting pedal edema No Tenderness. No redness.  PSYCH: alert & oriented x 3 with fluent speech NEURO: no focal motor/sensory deficits  LABORATORY DATA:   . CBC Latest Ref Rng & Units 02/06/2016 02/03/2016 02/03/2016  WBC 4.0 - 10.3 10e3/uL 6.7 7.3 5.6  Hemoglobin 13.0 - 17.1 g/dL 10.4(L) 9.7(L) 8.9(L)  Hematocrit 38.4 - 49.9 % 32.8(L) 30.5(L) 27.8(L)  Platelets 140 - 400 10e3/uL 104(L) 119(L) 105(L)   . CMP Latest Ref Rng & Units 02/06/2016 02/03/2016 02/03/2016  Glucose 70 - 140 mg/dl 128 113(H) 123(H)  BUN 7.0 - 26.0  mg/dL 35.2(H) 14 25(H)  Creatinine 0.7 - 1.3 mg/dL 5.9(HH) 3.57(H) 4.65(H)  Sodium 136 - 145 mEq/L 136 135 136  Potassium 3.5 - 5.1 mEq/L 4.2 4.1 3.3(L)  Chloride 101 - 111 mmol/L - 97(L) 98(L)  CO2 22 - 29 mEq/L '25 27 25  '$ Calcium 8.4 - 10.4 mg/dL 9.7 8.6(L) 8.5(L)  Total Protein 6.4 - 8.3 g/dL 7.1 - -  Total Bilirubin 0.20 - 1.20 mg/dL 0.63 - -  Alkaline Phos 40 - 150 U/L 87 - -  AST 5 - 34 U/L 29 - -  ALT 0 - 55 U/L 22 - -          RADIOGRAPHIC STUDIES: I have personally reviewed the radiological images as listed and agreed with the findings in the report.  . *[employer].                        [place], [address] [address]                            [phone]  ------------------------------------------------------------------- Transthoracic Echocardiography  Patient:    Kalden, Wanke MR #:       [id number] Study Date: 12/06/2015 Gender:     M Age:        65 Height:     175.3 cm Weight:     94.8 kg BSA:        2.18 m^2 Pt. Status: Room:   SONOGRAPHER  [place], Outpatient  ATTENDING    [place], [place], Gautam Kishore  REFERRING    [place], [place] Kishore  cc:  ------------------------------------------------------------------- LV EF: 65% -   70%  ------------------------------------------------------------------- History:   PMH:  Former Smoker, Anemia, Mild Aortic Stenosis. Patient with  AL Amyloidosis for followup to r/o Cardiac Amyloidosis.  Risk factors:  Hypertension. Dyslipidemia.  ------------------------------------------------------------------- Study Conclusions  - Left ventricle: The cavity size was normal. There was severe   concentric hypertrophy. Systolic function was vigorous. The   estimated ejection fraction was in the range of 65% to 70%. Wall   motion was normal; there were no regional wall  motion   abnormalities. Features are consistent with a pseudonormal left   ventricular filling pattern, with concomitant abnormal relaxation   and increased filling pressure (grade 2 diastolic dysfunction).   Doppler parameters are consistent with elevated ventricular   end-diastolic filling pressure. - Aortic valve: Trileaflet; moderately thickened, moderately   calcified leaflets. Transvalvular velocity was minimally   increased. There was mild stenosis. There was no regurgitation. - Left atrium: The atrium was mildly dilated. - Right atrium: The atrium was normal in size. - Tricuspid valve: There was no regurgitation. - Pulmonary arteries: Systolic pressure was within the normal   range. - Inferior vena cava: The vessel was normal in size. - Pericardium, extracardiac: There was no pericardial effusion.  Impressions:  - There is no significant change when compared to the prior study   from 06/10/2015.    ASSESSMENT & PLAN:    Mr Jose Garrett is very pleasant 65 yo caucasian male with   1) AL Amyloidosis noted on renal biopsy with nephrotic range proteinuria on urinalysis and some lambda free light chains on serum and urinary IFE . On diagnosis:  SPEP with small amount of M-spike 0.3  Echo showed no systolic dysfunction but grade 2 diastolic dysfunction. [facility] cardiac staging 1 as per troponin and BNP criteria. Bone marrow with no overt evidence of multiple myeloma. PET/CT scan showed no evidence of bony lesions or lymphadenopathy . Urine showed about 15 g of protein in 24 hours   Patient is status post CyBorD x 6 cycles He was then on maintenance Velcade and had progression of his AL Amyloidosis.  He was then switched to Ixazomib and Dexamethasone and has completed 6 cycles without significant toxicities. His UPEP shows about 15 g of proteins per 24 hours but no M spike.  SPEP from 11/22/2015 shows no M spike. Serum kappa lambda free light chains difference less than  4 Suggesting continued hematologic complete response.  SPEP and SFLC from 12/20/2015 - shows no M spike and normal Lambda ratio. Upep 12/02/2015- 15.88G O total protein with no M spike Echo repeated on 12/06/2015 shows no changes in heart function compared to previous echo from March 2017  2) Thrombocytopenia mild likely from the Ixazomib. Patient is already on renally adjusted dose of the medication.  Plan -- No overt limiting toxicity from Ixazomib  --Given persistent hematologic response and his neuropathy we will switch Ixazomib maintenance day 1 and 15 every 28 days --continue on low-dose aspirin for VTE prophylaxis given his high risk of venous thromboembolism in the setting of nephrotic syndrome.  -continue Acyclovir for shingles prophylaxis. -Echocardiogram noted and remains unchanged. We'll continue Echo Q6 months  2) multifactorial anemia - likely multifactorial -from AL amyloidosis, anemia from CKD. Hemoglobin is improved at 10.4 stable and has predominantly due to his significant chronic kidney disease Ferritin levels are now adequate  B12 WNL -Previously deficient  Plan - No indication for PRBC transfusion at this time -Continue Procrit as per nephrology. -continue B12 replacement SL   3) CKD stage V-related to hypertension and AL Amyloidosis. Has been started on hemodialysis through a tunnel catheter since his  last clinic visit. Had AV fistula placement on 12/27/2015  Plan -Continue hemodialysis as per nephrologist.   4) Feet tingling and numbness ? Related to AL Amyloidosis vs Velcade vs B12 deficiency stable.  No evidence of change in symptoms. Plan -We'll switch Ixazomib maintenance to every other week dosing to minimize impact on neuropathy. -Dose to be taken after hemodialysis session on those days. -Will need to monitor for neuropathy with his Ixazomib.   RTC with Dr [name] in 6 weeks with labs  All questions were answered. The patient knows to call the clinic  with any problems, questions or concerns. I spent 25 minutes counseling the patient face to face. The total time spent in the appointment was 25 minutes and more than 50% was on counseling.   Sullivan Lone MD [place] [facility]/On[facility] Physician [employer]  (Office):       [phone] (Work cell):  [phone] (Fax):           [phone]

## 2016-02-29 ENCOUNTER — Inpatient Hospital Stay (HOSPITAL_COMMUNITY)
Admission: RE | Admit: 2016-02-29 | Discharge: 2016-02-29 | Disposition: A | Discharge status: Home / Self Care | Payer: Self-pay | Source: Ambulatory Visit | Attending: Nephrology | Admitting: Nephrology

## 2016-03-19 ENCOUNTER — Telehealth: Payer: Self-pay | Admitting: Hematology

## 2016-03-19 ENCOUNTER — Other Ambulatory Visit (HOSPITAL_BASED_OUTPATIENT_CLINIC_OR_DEPARTMENT_OTHER): Payer: Self-pay

## 2016-03-19 ENCOUNTER — Ambulatory Visit (HOSPITAL_BASED_OUTPATIENT_CLINIC_OR_DEPARTMENT_OTHER): Payer: Self-pay | Admitting: Hematology

## 2016-03-19 ENCOUNTER — Encounter: Payer: Self-pay | Admitting: Hematology

## 2016-03-19 VITALS — BP 160/57 | HR 71 | Temp 97.5°F | Resp 18 | Ht 70.0 in | Wt 195.6 lb

## 2016-03-19 DIAGNOSIS — E8581 Light chain (AL) amyloidosis: Secondary | ICD-10-CM

## 2016-03-19 DIAGNOSIS — I129 Hypertensive chronic kidney disease with stage 1 through stage 4 chronic kidney disease, or unspecified chronic kidney disease: Secondary | ICD-10-CM

## 2016-03-19 DIAGNOSIS — D696 Thrombocytopenia, unspecified: Secondary | ICD-10-CM

## 2016-03-19 DIAGNOSIS — N183 Chronic kidney disease, stage 3 (moderate): Secondary | ICD-10-CM | POA: Diagnosis not present

## 2016-03-19 DIAGNOSIS — E538 Deficiency of other specified B group vitamins: Secondary | ICD-10-CM

## 2016-03-19 DIAGNOSIS — D631 Anemia in chronic kidney disease: Secondary | ICD-10-CM

## 2016-03-19 DIAGNOSIS — G629 Polyneuropathy, unspecified: Secondary | ICD-10-CM

## 2016-03-19 DIAGNOSIS — Z992 Dependence on renal dialysis: Secondary | ICD-10-CM

## 2016-03-19 DIAGNOSIS — C8581 Other specified types of non-Hodgkin lymphoma, lymph nodes of head, face, and neck: Secondary | ICD-10-CM

## 2016-03-19 DIAGNOSIS — N189 Chronic kidney disease, unspecified: Secondary | ICD-10-CM

## 2016-03-19 LAB — CBC & DIFF AND RETIC
BASO%: 0.3 % (ref 0.0–2.0)
Basophils Absolute: 0 10*3/uL (ref 0.0–0.1)
EOS%: 7.1 % — ABNORMAL HIGH (ref 0.0–7.0)
Eosinophils Absolute: 0.4 10*3/uL (ref 0.0–0.5)
HCT: 35.6 % — ABNORMAL LOW (ref 38.4–49.9)
HGB: 11.8 g/dL — ABNORMAL LOW (ref 13.0–17.1)
IMMATURE RETIC FRACT: 13.5 % — AB (ref 3.00–10.60)
LYMPH#: 1 10*3/uL (ref 0.9–3.3)
LYMPH%: 16.6 % (ref 14.0–49.0)
MCH: 31.2 pg (ref 27.2–33.4)
MCHC: 33.1 g/dL (ref 32.0–36.0)
MCV: 94.2 fL (ref 79.3–98.0)
MONO#: 0.4 10*3/uL (ref 0.1–0.9)
MONO%: 5.6 % (ref 0.0–14.0)
NEUT%: 70.4 % (ref 39.0–75.0)
NEUTROS ABS: 4.4 10*3/uL (ref 1.5–6.5)
Platelets: 105 10*3/uL — ABNORMAL LOW (ref 140–400)
RBC: 3.78 10*6/uL — AB (ref 4.20–5.82)
RDW: 16.1 % — AB (ref 11.0–14.6)
RETIC CT ABS: 93.74 10*3/uL (ref 34.80–93.90)
Retic %: 2.48 % — ABNORMAL HIGH (ref 0.80–1.80)
WBC: 6.2 10*3/uL (ref 4.0–10.3)

## 2016-03-19 LAB — COMPREHENSIVE METABOLIC PANEL
ALT: 10 U/L (ref 0–55)
AST: 13 U/L (ref 5–34)
Albumin: 4 g/dL (ref 3.5–5.0)
Alkaline Phosphatase: 82 U/L (ref 40–150)
Anion Gap: 19 mEq/L — ABNORMAL HIGH (ref 3–11)
BILIRUBIN TOTAL: 0.65 mg/dL (ref 0.20–1.20)
BUN: 72.6 mg/dL — ABNORMAL HIGH (ref 7.0–26.0)
CHLORIDE: 103 meq/L (ref 98–109)
CO2: 21 meq/L — AB (ref 22–29)
CREATININE: 9.9 mg/dL — AB (ref 0.7–1.3)
Calcium: 9.4 mg/dL (ref 8.4–10.4)
EGFR: 5 mL/min/{1.73_m2} — ABNORMAL LOW (ref >90)
GLUCOSE: 112 mg/dL (ref 70–140)
Potassium: 4.4 mEq/L (ref 3.5–5.1)
SODIUM: 143 meq/L (ref 136–145)
TOTAL PROTEIN: 7.7 g/dL (ref 6.4–8.3)

## 2016-03-19 NOTE — Telephone Encounter (Signed)
Appointments scheduled per 1/8 LOS. Patient given AVS report and calendars with future scheduled appointments.  °

## 2016-03-20 LAB — KAPPA/LAMBDA LIGHT CHAINS
IG LAMBDA FREE LIGHT CHAIN: 105.5 mg/L — AB (ref 5.7–26.3)
Ig Kappa Free Light Chain: 128.4 mg/L — ABNORMAL HIGH (ref 3.3–19.4)
KAPPA/LAMBDA FLC RATIO: 1.22 (ref 0.26–1.65)

## 2016-03-20 NOTE — Progress Notes (Signed)
.    Hematology oncology clinic note  Date of service  .03/19/2016  Patient Care Team: [name], MD as PCP - General (Family Medicine) [name], MD as PCP - Hematology/Oncology (Hematology and Oncology) [name], MD as Consulting Physician (Nephrology)  CHIEF COMPLAINTS: followup for AL amyloidosis.  HISTORY OF PRESENTING ILLNESS: Please see my initial consultation for details on presentation.  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 AL amyloidosis noted on kidney biopsy diagnosed 09/28/2014.    Current treatment Ixazomib maintainence   previous treatment  CyBorD x 6 cycles Evaluated for and declined Auto HSCT at [facility]. - maintenance Velcade  Ixazomib + Dexamethasone s/p 6 cycles  INTERVAL HISTORY  [name] is here for his scheduled follow-up for AL amyloidosis.  He is doing well and had a good holiday season. Continues to be on hemodialysis 3 times a week and is tolerating it okay notes little bit of fatigue after dialysis. Still produces urine.  No chest pain no shortness of breath. Mild grade 1 neuropathy controlled by gabapentin. No other acute new symptoms.  MEDICAL HISTORY:  Past Medical History:  Diagnosis Date  . Amyloidosis ([place])   . Anemia Dx 2016  . Chronic diastolic CHF (congestive heart failure), NYHA class 1 ([place])    /hospital problem list 09/08/2014  . Chronic renal disease, stage III    /hospital problem list 09/08/2014  . Diarrhea   . Dyspnea    with exertion  . Enlarged heart 2015  . Family history of adverse reaction to anesthesia    "my daughter can't take certain anesthesia agents" (09/08/2014)  . GERD (gastroesophageal reflux disease)   . History of blood transfusion 09/08/2014   "got hematoma after renal biopsy & HgB dropped"  . Hyperlipidemia Dx 2012  . Hypertension Dx 2012  . Renal disorder    "there is a spot on my kidney" (09/08/2014)  .  Restless legs     SURGICAL HISTORY: Past Surgical History:  Procedure Laterality Date  . AV FISTULA PLACEMENT Right 12/27/2015   Procedure: Right Arm ARTERIOVENOUS (AV) FISTULA CREATION;  Surgeon: Angelia Mould, MD;  Location: [place];  Service: Vascular;  Laterality: Right;  . COLONOSCOPY    . IR GENERIC HISTORICAL  01/27/2016   IR FLUORO GUIDE CV LINE RIGHT 01/27/2016 Arne Cleveland, MD [facility]INTER[facility]  . IR GENERIC HISTORICAL  01/27/2016   IR US GUIDE VASC ACCESS RIGHT 01/27/2016 Arne Cleveland, MD [facility]  . LAPAROSCOPIC CHOLECYSTECTOMY  03/2011  . RENAL BIOPSY, PERCUTANEOUS Right 09/08/2014  . TONSILLECTOMY  ~ 1960    SOCIAL HISTORY: Social History   Social History  . Marital status: Divorced    Spouse name: N/A  . Number of children: N/A  . Years of education: N/A   Occupational History  . Not on file.   Social History Main Topics  . Smoking status: Former Smoker    Packs/day: 2.00    Years: 40.00    Types: Cigarettes    Quit date: 03/19/2011  . Smokeless tobacco: Never Used  . Alcohol use No  . Drug use: No     Comment: 09/08/2014 "I have used marijuana till the 1990's". Notes that he quit 15 yrs ago  . Sexual activity: Not Currently   Other Topics Concern  . Not on file   Social History Narrative  . 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 Lives  with his daughter and her finance.  FAMILY HISTORY: Family History  Problem Relation Age of Onset  . Hypertension Mother   . Diabetes Mother   . Heart disease Mother   . Skin cancer Mother   . Hypertension Father   . Diabetes Father   . Diabetes Sister     ALLERGIES:  is allergic to no known allergies.  MEDICATIONS:  Current Outpatient Prescriptions  Medication Sig Dispense Refill  . acetaminophen (TYLENOL) 325 MG tablet Take 325-650 mg by mouth every 6 (six) hours as needed (back pain).    [name] acyclovir (ZOVIRAX) 400 MG tablet TAKE 1 TABLET BY  MOUTH DAILY. (DOSE ADJUSTED FOR RENAL FUNCTION) 60 tablet 3  . aspirin EC 81 MG tablet Take 1 tablet (81 mg total) by mouth daily. 30 tablet 11  . calcium acetate (PHOSLO) 667 MG capsule Take 1 capsule (667 mg total) by mouth 3 (three) times daily with meals. 90 capsule 0  . calcium carbonate (TUMS - DOSED IN MG ELEMENTAL CALCIUM) 500 MG chewable tablet Chew 1-2 tablets by mouth 2 (two) times daily as needed for indigestion or heartburn.     . cetirizine (ZYRTEC) 10 MG tablet Take 10 mg by mouth daily.    . Cholecalciferol (VITAMIN D3) 3000 units TABS Take 3,000 Units by mouth daily.    . Cyanocobalamin (VITAMIN B-12) 1000 MCG/15ML LIQD Take 1 mL by mouth daily.    [name] Epoetin Alfa (PROCRIT IJ) Inject as directed every 14 (fourteen) days. Done at [facility] Last injection 01/19/16    . FLONASE 50 MCG/ACT nasal spray Place 1 spray into both nostrils daily as needed. (Patient taking differently: Place 1 spray into both nostrils daily as needed for allergies or rhinitis. )    . gabapentin (NEURONTIN) 250 MG/5ML solution Take 6 mLs (300 mg total) by mouth at bedtime. 470 mL 0  . ixazomib citrate (NINLARO) 3 MG capsule Take 1 capsule (3 mg total) by mouth once a week. (on D1,8 and 15 every 28 days)Take on an empty stomach 1hr before or 2hrs after food. Do not crush, chew, or open. (Patient taking differently: Take 3 mg by mouth every 14 (fourteen) days. Every other Thursday) 6 capsule 2  . LORazepam (ATIVAN) 1 MG tablet Take 1 tablet (1 mg total) by mouth daily as needed for anxiety (nausea). 30 tablet 0  . ondansetron (ZOFRAN) 8 MG tablet Take 8 mg by mouth every 8 (eight) hours as needed for nausea or vomiting. Reported on 07/08/2015     No current facility-administered medications for this visit.     REVIEW OF SYSTEMS:   All other systems were reviewed with the patient and are negative.   PHYSICAL EXAMINATION: ECOG PERFORMANCE STATUS: 2 - Symptomatic, <50% confined to bed  Vitals:    03/19/16 1019  BP: (!) 160/57  Pulse: 71  Resp: 18  Temp: 97.5 F (36.4 C)   Filed Weights   03/19/16 1019  Weight: 195 lb 9.6 oz (88.7 kg)    GENERAL:alert, Mild this is due to sinus congestion and sore throat and cough. SKIN: Ecchymosis on upper extremities. EYES: nl EOM, PERL OROPHARYNX: No JVD No cervical adenopathy.  NECK: no JVD LYMPH:  no palpable lymphadenopathy in the cervical, axillary or inguinal LUNGS: clear to auscultation and percussion with normal breathing effort HEART: regular rate & rhythm and 2/6 SM aortic area ABDOMEN:abdomen soft, non-tender and normal bowel sounds Musculoskeletal: B/l trace pitting pedal edema No Tenderness. No redness.  PSYCH:  alert & oriented x 3 with fluent speech NEURO: no focal motor/sensory deficits  LABORATORY DATA:   . CBC Latest Ref Rng & Units 03/19/2016 02/06/2016 02/03/2016  WBC 4.0 - 10.3 10e3/uL 6.2 6.7 7.3  Hemoglobin 13.0 - 17.1 g/dL 11.8(L) 10.4(L) 9.7(L)  Hematocrit 38.4 - 49.9 % 35.6(L) 32.8(L) 30.5(L)  Platelets 140 - 400 10e3/uL 105(L) 104(L) 119(L)   . CMP Latest Ref Rng & Units 03/19/2016 02/06/2016 02/03/2016  Glucose 70 - 140 mg/dl 112 128 113(H)  BUN 7.0 - 26.0 mg/dL 72.6(H) 35.2(H) 14  Creatinine 0.7 - 1.3 mg/dL 9.9(HH) 5.9(HH) 3.57(H)  Sodium 136 - 145 mEq/L 143 136 135  Potassium 3.5 - 5.1 mEq/L 4.4 4.2 4.1  Chloride 101 - 111 mmol/L - - 97(L)  CO2 22 - 29 mEq/L 21(L) 25 27  Calcium 8.4 - 10.4 mg/dL 9.4 9.7 8.6(L)  Total Protein 6.4 - 8.3 g/dL 7.7 7.1 -  Total Bilirubin 0.20 - 1.20 mg/dL 0.65 0.63 -  Alkaline Phos 40 - 150 U/L 82 87 -  AST 5 - 34 U/L 13 29 -  ALT 0 - 55 U/L 10 22 -     RADIOGRAPHIC STUDIES: I have personally reviewed the radiological images as listed and agreed with the findings in the report.  . *[facility] [employer].                        [place], [place] [address]                             [phone]  ------------------------------------------------------------------- Transthoracic Echocardiography  Patient:    Doyel, Mulkern MR #:       [id number] Study Date: 12/06/2015 Gender:     M Age:        67 Height:     175.3 cm Weight:     94.8 kg BSA:        2.18 m^2 Pt. Status: Room:   SONOGRAPHER  [place], Outpatient  ATTENDING    [place], [place], Gautam Kishore  REFERRING    [place], [place] Kishore  cc:  ------------------------------------------------------------------- LV EF: 65% -   70%  ------------------------------------------------------------------- History:   PMH:  Former Smoker, Anemia, Mild Aortic Stenosis. Patient with AL Amyloidosis for followup to r/o Cardiac Amyloidosis.  Risk factors:  Hypertension. Dyslipidemia.  ------------------------------------------------------------------- Study Conclusions  - Left ventricle: The cavity size was normal. There was severe   concentric hypertrophy. Systolic function was vigorous. The   estimated ejection fraction was in the range of 65% to 70%. Wall   motion was normal; there were no regional wall motion   abnormalities. Features are consistent with a pseudonormal left   ventricular filling pattern, with concomitant abnormal relaxation   and increased filling pressure (grade 2 diastolic dysfunction).   Doppler parameters are consistent with elevated ventricular   end-diastolic filling pressure. - Aortic valve: Trileaflet; moderately thickened, moderately   calcified leaflets. Transvalvular velocity was minimally   increased. There was mild stenosis. There was no regurgitation. - Left atrium: The atrium was mildly dilated. - Right  atrium: The atrium was normal in size. - Tricuspid valve: There was no regurgitation. - Pulmonary arteries: Systolic pressure was within the normal   range. - Inferior vena cava: The vessel was normal in size. -  Pericardium, extracardiac: There was no pericardial effusion.  Impressions:  - There is no significant change when compared to the prior study   from 06/10/2015.    ASSESSMENT & PLAN:    Mr Jose Garrett is very pleasant 66 yo caucasian male with   1) AL Amyloidosis noted on renal biopsy with nephrotic range proteinuria on urinalysis and some lambda free light chains on serum and urinary IFE . On diagnosis:  SPEP with small amount of M-spike 0.3  Echo showed no systolic dysfunction but grade 2 diastolic dysfunction. [facility] cardiac staging 1 as per troponin and BNP criteria. Bone marrow with no overt evidence of multiple myeloma. PET/CT scan showed no evidence of bony lesions or lymphadenopathy . Urine showed about 15 g of protein in 24 hours   Patient is status post CyBorD x 6 cycles He was then on maintenance Velcade and had progression of his AL Amyloidosis.  He was then switched to Ixazomib and Dexamethasone and has completed 6 cycles without significant toxicities.  SPEP and SFLC from 12/20/2015 - shows no M spike and normal Lambda ratio. Echo repeated on 12/06/2015 shows no changes in heart function compared to previous echo from March 2017  2) Thrombocytopenia mild likely from the Ixazomib. Patient is already on renally adjusted dose of the medication.  Plan -- No overt limiting toxicity from Ixazomib. Has grade 1 neuropathy from previous Velcade which is stable and possibly somewhat improved. --Given persistent hematologic response and his neuropathy we will switch Ixazomib maintenance day 1 and 15 every 28 days --We'll follow-up on myeloma panel and serum free light chain results from today --continue on low-dose aspirin for VTE prophylaxis given his high risk of venous thromboembolism in the setting of nephrotic syndrome.  -continue Acyclovir for shingles prophylaxis. -Echocardiogram repeat is due in March 2018   2) multifactorial anemia - likely multifactorial -from AL  amyloidosis, anemia from CKD. Hemoglobin is improved at 11.8 stable and is predominantly due to his significant chronic kidney disease B12 WNL -Previously deficient  Plan -Continue Procrit and iron replacement as per nephrology. -continue B12 replacement SL   3) CKD stage V-related to hypertension and AL Amyloidosis. Has been started on hemodialysis through a tunnel catheter since his last clinic visit. Had AV fistula placement on 12/27/2015  Plan -Continue hemodialysis as per nephrologist.   4) Feet tingling and numbness ? Related to AL Amyloidosis vs Velcade vs B12 deficiency stable.  No evidence of change in symptoms. Plan -continue Ixazomib maintenance every other week dosing. -Dose to be taken after hemodialysis session on those days.  -Will need to monitor for neuropathy with his Ixazomib.   RTC with Dr [name] in 8 weeks with labs and ECHO  All questions were answered. The patient knows to call the clinic with any problems, questions or concerns. I spent 20 minutes counseling the patient face to face. The total time spent in the appointment was 25 minutes and more than 50% was on counseling.   Sullivan Lone MD [place] [facility]/Oncology Physician [facility]  (Office):       [phone] (Work cell):  [phone] (Fax):           [phone]

## 2016-03-21 LAB — MULTIPLE MYELOMA PANEL, SERUM
ALBUMIN SERPL ELPH-MCNC: 3.8 g/dL (ref 2.9–4.4)
ALBUMIN/GLOB SERPL: 1.2 (ref 0.7–1.7)
ALPHA 1: 0.3 g/dL (ref 0.0–0.4)
ALPHA2 GLOB SERPL ELPH-MCNC: 0.9 g/dL (ref 0.4–1.0)
B-GLOBULIN SERPL ELPH-MCNC: 0.9 g/dL (ref 0.7–1.3)
GAMMA GLOB SERPL ELPH-MCNC: 1.1 g/dL (ref 0.4–1.8)
GLOBULIN, TOTAL: 3.2 g/dL (ref 2.2–3.9)
IGG (IMMUNOGLOBIN G), SERUM: 984 mg/dL (ref 700–1600)
IgA, Qn, Serum: 101 mg/dL (ref 61–437)
IgM, Qn, Serum: 162 mg/dL (ref 20–172)
Total Protein: 7 g/dL (ref 6.0–8.5)

## 2016-04-05 ENCOUNTER — Telehealth: Payer: Self-pay | Admitting: Pharmacist

## 2016-04-05 NOTE — Telephone Encounter (Signed)
Received a call from [facility] confirming pt was still on Ninlaro.  I informed rep that pt is still on tx w/ Ninlaro and has had no changes that should change his eligibility for their assistance program. There are still refills remaining per the rep and they will contact pt to arrange next shipment.  Kennith Center, Pharm.D., CPP 04/05/2016@12 :[facility]

## 2016-04-13 ENCOUNTER — Encounter: Payer: Self-pay | Admitting: Vascular Surgery

## 2016-04-13 DIAGNOSIS — N186 End stage renal disease: Secondary | ICD-10-CM | POA: Diagnosis not present

## 2016-04-13 DIAGNOSIS — N2581 Secondary hyperparathyroidism of renal origin: Secondary | ICD-10-CM | POA: Diagnosis not present

## 2016-04-13 DIAGNOSIS — D631 Anemia in chronic kidney disease: Secondary | ICD-10-CM | POA: Diagnosis not present

## 2016-04-13 DIAGNOSIS — Z23 Encounter for immunization: Secondary | ICD-10-CM | POA: Diagnosis not present

## 2016-04-13 DIAGNOSIS — D509 Iron deficiency anemia, unspecified: Secondary | ICD-10-CM | POA: Diagnosis not present

## 2016-04-16 DIAGNOSIS — D631 Anemia in chronic kidney disease: Secondary | ICD-10-CM | POA: Diagnosis not present

## 2016-04-16 DIAGNOSIS — N2581 Secondary hyperparathyroidism of renal origin: Secondary | ICD-10-CM | POA: Diagnosis not present

## 2016-04-16 DIAGNOSIS — D509 Iron deficiency anemia, unspecified: Secondary | ICD-10-CM | POA: Diagnosis not present

## 2016-04-16 DIAGNOSIS — N186 End stage renal disease: Secondary | ICD-10-CM | POA: Diagnosis not present

## 2016-04-16 DIAGNOSIS — Z23 Encounter for immunization: Secondary | ICD-10-CM | POA: Diagnosis not present

## 2016-04-18 ENCOUNTER — Ambulatory Visit (HOSPITAL_COMMUNITY)
Admission: RE | Admit: 2016-04-18 | Discharge: 2016-04-18 | Disposition: A | Discharge status: Home / Self Care | Payer: Medicare Other | Source: Ambulatory Visit | Attending: Vascular Surgery | Admitting: Vascular Surgery

## 2016-04-18 ENCOUNTER — Ambulatory Visit (INDEPENDENT_AMBULATORY_CARE_PROVIDER_SITE_OTHER): Payer: Self-pay | Admitting: Vascular Surgery

## 2016-04-18 ENCOUNTER — Encounter: Payer: Self-pay | Admitting: Vascular Surgery

## 2016-04-18 VITALS — BP 182/72 | HR 98 | Temp 97.0°F | Resp 16 | Ht 70.0 in | Wt 196.0 lb

## 2016-04-18 DIAGNOSIS — N183 Chronic kidney disease, stage 3 unspecified: Secondary | ICD-10-CM

## 2016-04-18 DIAGNOSIS — D509 Iron deficiency anemia, unspecified: Secondary | ICD-10-CM | POA: Diagnosis not present

## 2016-04-18 DIAGNOSIS — Z4931 Encounter for adequacy testing for hemodialysis: Secondary | ICD-10-CM | POA: Diagnosis not present

## 2016-04-18 DIAGNOSIS — Z992 Dependence on renal dialysis: Secondary | ICD-10-CM

## 2016-04-18 DIAGNOSIS — Z23 Encounter for immunization: Secondary | ICD-10-CM | POA: Diagnosis not present

## 2016-04-18 DIAGNOSIS — N2581 Secondary hyperparathyroidism of renal origin: Secondary | ICD-10-CM | POA: Diagnosis not present

## 2016-04-18 DIAGNOSIS — N186 End stage renal disease: Secondary | ICD-10-CM | POA: Diagnosis not present

## 2016-04-18 DIAGNOSIS — D631 Anemia in chronic kidney disease: Secondary | ICD-10-CM | POA: Diagnosis not present

## 2016-04-18 NOTE — Progress Notes (Signed)
Patient name: Jose Garrett MRN: [id number] DOB: [birth year] Sex: male  REASON FOR VISIT: Follow up of right radiocephalic AV fistula.  HPI: Jose Garrett is a 66 y.o. male had a right radiocephalic AV fistula placed on 12/27/2015. I last saw him on 02/15/2016. He began dialysis in November and had a right IJ tunneled dialysis catheter placed. Duplex scan at the time of his last visit showed that the diameters of the fistula ranged from 0.41-0.49 cm. There were no areas of significant stenosis noted. The fistula appeared to be gradually maturing but was not adequate size yet and therefore he comes in for a follow up visit in duplex.  The patient had begun using his fistula but recently had an infiltrate and therefore they're using his catheter again. He denies pain or paresthesias in the right arm.  Current Outpatient Prescriptions  Medication Sig Dispense Refill  . acetaminophen (TYLENOL) 325 MG tablet Take 325-650 mg by mouth every 6 (six) hours as needed (back pain).    [name] acyclovir (ZOVIRAX) 400 MG tablet TAKE 1 TABLET BY MOUTH DAILY. (DOSE ADJUSTED FOR RENAL FUNCTION) 60 tablet 3  . aspirin EC 81 MG tablet Take 1 tablet (81 mg total) by mouth daily. 30 tablet 11  . calcium acetate (PHOSLO) 667 MG capsule Take 1 capsule (667 mg total) by mouth 3 (three) times daily with meals. 90 capsule 0  . calcium carbonate (TUMS - DOSED IN MG ELEMENTAL CALCIUM) 500 MG chewable tablet Chew 1-2 tablets by mouth 2 (two) times daily as needed for indigestion or heartburn.     . cetirizine (ZYRTEC) 10 MG tablet Take 10 mg by mouth daily.    . Cholecalciferol (VITAMIN D3) 3000 units TABS Take 3,000 Units by mouth daily.    . Cyanocobalamin (VITAMIN B-12) 1000 MCG/15ML LIQD Take 1 mL by mouth daily.    [name] Epoetin Alfa (PROCRIT IJ) Inject as directed every 14 (fourteen) days. Done at [facility] Last injection 01/19/16    . FLONASE 50 MCG/ACT nasal spray Place 1 spray into both nostrils daily as needed.  (Patient taking differently: Place 1 spray into both nostrils daily as needed for allergies or rhinitis. )    . gabapentin (NEURONTIN) 250 MG/5ML solution Take 6 mLs (300 mg total) by mouth at bedtime. 470 mL 0  . ixazomib citrate (NINLARO) 3 MG capsule Take 1 capsule (3 mg total) by mouth once a week. (on D1,8 and 15 every 28 days)Take on an empty stomach 1hr before or 2hrs after food. Do not crush, chew, or open. (Patient taking differently: Take 3 mg by mouth every 14 (fourteen) days. Every other Thursday) 6 capsule 2  . LORazepam (ATIVAN) 1 MG tablet Take 1 tablet (1 mg total) by mouth daily as needed for anxiety (nausea). 30 tablet 0  . ondansetron (ZOFRAN) 8 MG tablet Take 8 mg by mouth every 8 (eight) hours as needed for nausea or vomiting. Reported on 07/08/2015     No current facility-administered medications for this visit.     REVIEW OF SYSTEMS:  [X]  denotes positive finding, [ ]  denotes negative finding Cardiac  Comments:  Chest pain or chest pressure:    Shortness of breath upon exertion:    Short of breath when lying flat:    Irregular heart rhythm:    Constitutional    Fever or chills:      PHYSICAL EXAM: Vitals:   04/18/16 1000  BP: (!) 182/72  Pulse: 98  Resp: 16  Temp: 97 F (36.1 C)  TempSrc: Oral  SpO2: 98%  Weight: 196 lb (88.9 kg)  Height: 5\' 10"  (1.778 m)    GENERAL: The patient is a well-nourished male, in no acute distress. The vital signs are documented above. CARDIOVASCULAR: There is a regular rate and rhythm. PULMONARY: There is good air exchange bilaterally without wheezing or rales. His fistula has an excellent thrill in the distal forearm. He has a palpable right radial pulse.  Duplex of right radiocephalic AV fistula: Duplex of the AV fistula shows the diameters have improved somewhat. Diameters range from 0.5-0.55 centimeters.  MEDICAL ISSUES:  STATUS POST RIGHT RADIOCEPHALIC AV FISTULA: Duplex scan does show that the fistula has increased  in size. There are no areas of significant stenosis noted. He did have an infiltrate recently and I think they should rest the fistula for about 3 weeks. If the patient has any further problems with the fistula that I think we should proceed with a fistulogram although by duplex we do not see any correctable problems.  Deitra [facility] [phone]

## 2016-04-20 DIAGNOSIS — Z23 Encounter for immunization: Secondary | ICD-10-CM | POA: Diagnosis not present

## 2016-04-20 DIAGNOSIS — D631 Anemia in chronic kidney disease: Secondary | ICD-10-CM | POA: Diagnosis not present

## 2016-04-20 DIAGNOSIS — N186 End stage renal disease: Secondary | ICD-10-CM | POA: Diagnosis not present

## 2016-04-20 DIAGNOSIS — D509 Iron deficiency anemia, unspecified: Secondary | ICD-10-CM | POA: Diagnosis not present

## 2016-04-20 DIAGNOSIS — N2581 Secondary hyperparathyroidism of renal origin: Secondary | ICD-10-CM | POA: Diagnosis not present

## 2016-04-23 DIAGNOSIS — N186 End stage renal disease: Secondary | ICD-10-CM | POA: Diagnosis not present

## 2016-04-23 DIAGNOSIS — D631 Anemia in chronic kidney disease: Secondary | ICD-10-CM | POA: Diagnosis not present

## 2016-04-23 DIAGNOSIS — Z23 Encounter for immunization: Secondary | ICD-10-CM | POA: Diagnosis not present

## 2016-04-23 DIAGNOSIS — N2581 Secondary hyperparathyroidism of renal origin: Secondary | ICD-10-CM | POA: Diagnosis not present

## 2016-04-23 DIAGNOSIS — D509 Iron deficiency anemia, unspecified: Secondary | ICD-10-CM | POA: Diagnosis not present

## 2016-04-25 DIAGNOSIS — Z23 Encounter for immunization: Secondary | ICD-10-CM | POA: Diagnosis not present

## 2016-04-25 DIAGNOSIS — N186 End stage renal disease: Secondary | ICD-10-CM | POA: Diagnosis not present

## 2016-04-25 DIAGNOSIS — D631 Anemia in chronic kidney disease: Secondary | ICD-10-CM | POA: Diagnosis not present

## 2016-04-25 DIAGNOSIS — N2581 Secondary hyperparathyroidism of renal origin: Secondary | ICD-10-CM | POA: Diagnosis not present

## 2016-04-25 DIAGNOSIS — D509 Iron deficiency anemia, unspecified: Secondary | ICD-10-CM | POA: Diagnosis not present

## 2016-04-27 DIAGNOSIS — N2581 Secondary hyperparathyroidism of renal origin: Secondary | ICD-10-CM | POA: Diagnosis not present

## 2016-04-27 DIAGNOSIS — N186 End stage renal disease: Secondary | ICD-10-CM | POA: Diagnosis not present

## 2016-04-27 DIAGNOSIS — D631 Anemia in chronic kidney disease: Secondary | ICD-10-CM | POA: Diagnosis not present

## 2016-04-27 DIAGNOSIS — Z23 Encounter for immunization: Secondary | ICD-10-CM | POA: Diagnosis not present

## 2016-04-27 DIAGNOSIS — D509 Iron deficiency anemia, unspecified: Secondary | ICD-10-CM | POA: Diagnosis not present

## 2016-04-30 DIAGNOSIS — N186 End stage renal disease: Secondary | ICD-10-CM | POA: Diagnosis not present

## 2016-04-30 DIAGNOSIS — N2581 Secondary hyperparathyroidism of renal origin: Secondary | ICD-10-CM | POA: Diagnosis not present

## 2016-04-30 DIAGNOSIS — Z23 Encounter for immunization: Secondary | ICD-10-CM | POA: Diagnosis not present

## 2016-04-30 DIAGNOSIS — D509 Iron deficiency anemia, unspecified: Secondary | ICD-10-CM | POA: Diagnosis not present

## 2016-04-30 DIAGNOSIS — D631 Anemia in chronic kidney disease: Secondary | ICD-10-CM | POA: Diagnosis not present

## 2016-05-02 DIAGNOSIS — D631 Anemia in chronic kidney disease: Secondary | ICD-10-CM | POA: Diagnosis not present

## 2016-05-02 DIAGNOSIS — Z23 Encounter for immunization: Secondary | ICD-10-CM | POA: Diagnosis not present

## 2016-05-02 DIAGNOSIS — N2581 Secondary hyperparathyroidism of renal origin: Secondary | ICD-10-CM | POA: Diagnosis not present

## 2016-05-02 DIAGNOSIS — D509 Iron deficiency anemia, unspecified: Secondary | ICD-10-CM | POA: Diagnosis not present

## 2016-05-02 DIAGNOSIS — N186 End stage renal disease: Secondary | ICD-10-CM | POA: Diagnosis not present

## 2016-05-04 DIAGNOSIS — N2581 Secondary hyperparathyroidism of renal origin: Secondary | ICD-10-CM | POA: Diagnosis not present

## 2016-05-04 DIAGNOSIS — Z23 Encounter for immunization: Secondary | ICD-10-CM | POA: Diagnosis not present

## 2016-05-04 DIAGNOSIS — D509 Iron deficiency anemia, unspecified: Secondary | ICD-10-CM | POA: Diagnosis not present

## 2016-05-04 DIAGNOSIS — N186 End stage renal disease: Secondary | ICD-10-CM | POA: Diagnosis not present

## 2016-05-04 DIAGNOSIS — D631 Anemia in chronic kidney disease: Secondary | ICD-10-CM | POA: Diagnosis not present

## 2016-05-07 DIAGNOSIS — N186 End stage renal disease: Secondary | ICD-10-CM | POA: Diagnosis not present

## 2016-05-07 DIAGNOSIS — D631 Anemia in chronic kidney disease: Secondary | ICD-10-CM | POA: Diagnosis not present

## 2016-05-07 DIAGNOSIS — N2581 Secondary hyperparathyroidism of renal origin: Secondary | ICD-10-CM | POA: Diagnosis not present

## 2016-05-07 DIAGNOSIS — Z23 Encounter for immunization: Secondary | ICD-10-CM | POA: Diagnosis not present

## 2016-05-07 DIAGNOSIS — D509 Iron deficiency anemia, unspecified: Secondary | ICD-10-CM | POA: Diagnosis not present

## 2016-05-09 DIAGNOSIS — I129 Hypertensive chronic kidney disease with stage 1 through stage 4 chronic kidney disease, or unspecified chronic kidney disease: Secondary | ICD-10-CM | POA: Diagnosis not present

## 2016-05-09 DIAGNOSIS — N186 End stage renal disease: Secondary | ICD-10-CM | POA: Diagnosis not present

## 2016-05-09 DIAGNOSIS — Z23 Encounter for immunization: Secondary | ICD-10-CM | POA: Diagnosis not present

## 2016-05-09 DIAGNOSIS — D509 Iron deficiency anemia, unspecified: Secondary | ICD-10-CM | POA: Diagnosis not present

## 2016-05-09 DIAGNOSIS — D631 Anemia in chronic kidney disease: Secondary | ICD-10-CM | POA: Diagnosis not present

## 2016-05-09 DIAGNOSIS — N2581 Secondary hyperparathyroidism of renal origin: Secondary | ICD-10-CM | POA: Diagnosis not present

## 2016-05-09 DIAGNOSIS — Z992 Dependence on renal dialysis: Secondary | ICD-10-CM | POA: Diagnosis not present

## 2016-05-11 DIAGNOSIS — N2581 Secondary hyperparathyroidism of renal origin: Secondary | ICD-10-CM | POA: Diagnosis not present

## 2016-05-11 DIAGNOSIS — D631 Anemia in chronic kidney disease: Secondary | ICD-10-CM | POA: Diagnosis not present

## 2016-05-11 DIAGNOSIS — N186 End stage renal disease: Secondary | ICD-10-CM | POA: Diagnosis not present

## 2016-05-11 DIAGNOSIS — D509 Iron deficiency anemia, unspecified: Secondary | ICD-10-CM | POA: Diagnosis not present

## 2016-05-14 DIAGNOSIS — D509 Iron deficiency anemia, unspecified: Secondary | ICD-10-CM | POA: Diagnosis not present

## 2016-05-14 DIAGNOSIS — N2581 Secondary hyperparathyroidism of renal origin: Secondary | ICD-10-CM | POA: Diagnosis not present

## 2016-05-14 DIAGNOSIS — N186 End stage renal disease: Secondary | ICD-10-CM | POA: Diagnosis not present

## 2016-05-14 DIAGNOSIS — D631 Anemia in chronic kidney disease: Secondary | ICD-10-CM | POA: Diagnosis not present

## 2016-05-15 ENCOUNTER — Encounter: Payer: Self-pay | Admitting: Hematology

## 2016-05-15 ENCOUNTER — Other Ambulatory Visit (HOSPITAL_BASED_OUTPATIENT_CLINIC_OR_DEPARTMENT_OTHER): Payer: Self-pay

## 2016-05-15 ENCOUNTER — Ambulatory Visit (HOSPITAL_BASED_OUTPATIENT_CLINIC_OR_DEPARTMENT_OTHER): Payer: Self-pay | Admitting: Hematology

## 2016-05-15 ENCOUNTER — Telehealth: Payer: Self-pay | Admitting: Hematology

## 2016-05-15 VITALS — BP 148/57 | HR 72 | Temp 97.7°F | Resp 19 | Wt 195.5 lb

## 2016-05-15 DIAGNOSIS — D696 Thrombocytopenia, unspecified: Secondary | ICD-10-CM

## 2016-05-15 DIAGNOSIS — N183 Chronic kidney disease, stage 3 (moderate): Secondary | ICD-10-CM | POA: Diagnosis not present

## 2016-05-15 DIAGNOSIS — Z992 Dependence on renal dialysis: Secondary | ICD-10-CM

## 2016-05-15 DIAGNOSIS — N184 Chronic kidney disease, stage 4 (severe): Secondary | ICD-10-CM

## 2016-05-15 DIAGNOSIS — G629 Polyneuropathy, unspecified: Secondary | ICD-10-CM

## 2016-05-15 DIAGNOSIS — R808 Other proteinuria: Secondary | ICD-10-CM

## 2016-05-15 DIAGNOSIS — I129 Hypertensive chronic kidney disease with stage 1 through stage 4 chronic kidney disease, or unspecified chronic kidney disease: Secondary | ICD-10-CM

## 2016-05-15 DIAGNOSIS — E8581 Light chain (AL) amyloidosis: Secondary | ICD-10-CM

## 2016-05-15 DIAGNOSIS — D631 Anemia in chronic kidney disease: Secondary | ICD-10-CM

## 2016-05-15 DIAGNOSIS — E538 Deficiency of other specified B group vitamins: Secondary | ICD-10-CM

## 2016-05-15 LAB — COMPREHENSIVE METABOLIC PANEL
ALBUMIN: 4.2 g/dL (ref 3.5–5.0)
ALK PHOS: 107 U/L (ref 40–150)
ALT: 16 U/L (ref 0–55)
AST: 16 U/L (ref 5–34)
Anion Gap: 15 mEq/L — ABNORMAL HIGH (ref 3–11)
BUN: 42.7 mg/dL — ABNORMAL HIGH (ref 7.0–26.0)
CHLORIDE: 100 meq/L (ref 98–109)
CO2: 25 mEq/L (ref 22–29)
Calcium: 9.5 mg/dL (ref 8.4–10.4)
Creatinine: 8.2 mg/dL (ref 0.7–1.3)
EGFR: 6 mL/min/{1.73_m2} — AB (ref >90)
Glucose: 98 mg/dl (ref 70–140)
POTASSIUM: 4.6 meq/L (ref 3.5–5.1)
Sodium: 141 mEq/L (ref 136–145)
Total Bilirubin: 0.74 mg/dL (ref 0.20–1.20)
Total Protein: 7.9 g/dL (ref 6.4–8.3)

## 2016-05-15 LAB — CBC & DIFF AND RETIC
BASO%: 0.5 % (ref 0.0–2.0)
Basophils Absolute: 0 10*3/uL (ref 0.0–0.1)
EOS%: 5.9 % (ref 0.0–7.0)
Eosinophils Absolute: 0.3 10*3/uL (ref 0.0–0.5)
HCT: 31.9 % — ABNORMAL LOW (ref 38.4–49.9)
HEMOGLOBIN: 10.7 g/dL — AB (ref 13.0–17.1)
Immature Retic Fract: 4.8 % (ref 3.00–10.60)
LYMPH%: 18.7 % (ref 14.0–49.0)
MCH: 32.5 pg (ref 27.2–33.4)
MCHC: 33.6 g/dL (ref 32.0–36.0)
MCV: 96.9 fL (ref 79.3–98.0)
MONO#: 0.5 10*3/uL (ref 0.1–0.9)
MONO%: 10.9 % (ref 0.0–14.0)
NEUT%: 64 % (ref 39.0–75.0)
NEUTROS ABS: 3 10*3/uL (ref 1.5–6.5)
Platelets: 89 10*3/uL — ABNORMAL LOW (ref 140–400)
RBC: 3.29 10*6/uL — AB (ref 4.20–5.82)
RDW: 16.9 % — AB (ref 11.0–14.6)
Retic %: 2.27 % — ABNORMAL HIGH (ref 0.80–1.80)
Retic Ct Abs: 74.68 10*3/uL (ref 34.80–93.90)
WBC: 4.7 10*3/uL (ref 4.0–10.3)
lymph#: 0.9 10*3/uL (ref 0.9–3.3)

## 2016-05-15 NOTE — Progress Notes (Signed)
.    Hematology oncology clinic note  Date of service  .05/15/2016  Patient Care Team: [name], MD as PCP - General (Family Medicine) [name], MD as PCP - Hematology/Oncology (Hematology and Oncology) [name], MD as Consulting Physician (Nephrology)  CHIEF COMPLAINTS: followup for AL amyloidosis.  HISTORY OF PRESENTING ILLNESS: Please see my initial consultation for details on presentation.  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 AL amyloidosis noted on kidney biopsy diagnosed 09/28/2014.    Current treatment Ixazomib maintenance q2weeks   previous treatment  CyBorD x 6 cycles Evaluated for and declined [facility] at [facility]. - maintenance Velcade  Ixazomib + Dexamethasone s/p 6 cycles  INTERVAL HISTORY  Jose Garrett is here for his scheduled follow-up for AL amyloidosis.  He notes no acute new concerns. Still with some grade 1-2 neuropathy that is not significantly changed. Notes some leg cramping with HD but this is getting better. Notes he is using a cane for better balance. We discussed continuing maintenance Ixazomib vs holding this and monitoring and he would like to continue maintenance Ixazomib at this time.  MEDICAL HISTORY:  Past Medical History:  Diagnosis Date  . Amyloidosis (HCC)   . Anemia Dx 2016  . Chronic diastolic CHF (congestive heart failure), NYHA class 1 (HCC)    /hospital problem list 09/08/2014  . Chronic renal disease, stage III    /hospital problem list 09/08/2014  . Diarrhea   . Dyspnea    with exertion  . Enlarged heart 2015  . Family history of adverse reaction to anesthesia    "my daughter can't take certain anesthesia agents" (09/08/2014)  . GERD (gastroesophageal reflux disease)   . History of blood transfusion 09/08/2014   "got hematoma after renal biopsy & HgB dropped"  . Hyperlipidemia Dx 2012  . Hypertension Dx 2012  . Renal disorder    "there is a spot on my kidney" (09/08/2014)  . Restless legs     SURGICAL HISTORY: Past Surgical History:  Procedure Laterality Date  . AV FISTULA PLACEMENT Right 12/27/2015   Procedure: Right Arm ARTERIOVENOUS (AV) FISTULA CREATION;  Surgeon: Chuck Hint, MD;  Location: [facility] OR;  Service: Vascular;  Laterality: Right;  . COLONOSCOPY    . IR GENERIC HISTORICAL  01/27/2016   IR FLUORO GUIDE CV LINE RIGHT 01/27/2016 Oley Balm, MD [facility]-INTERV RAD  . IR GENERIC HISTORICAL  01/27/2016   IR US GUIDE VASC ACCESS RIGHT 01/27/2016 Oley Balm, MD [facility]INTER[facility]  . LAPAROSCOPIC CHOLECYSTECTOMY  03/2011  . RENAL BIOPSY, PERCUTANEOUS Right 09/08/2014  . TONSILLECTOMY  ~ 1960    SOCIAL HISTORY: Social History   Social History  . Marital status: Divorced    Spouse name: N/A  . Number of children: N/A  . Years of education: N/A   Occupational History  . Not on file.   Social History Main Topics  . Smoking status: Former Smoker    Packs/day: 2.00    Years: 40.00    Types: Cigarettes    Quit date: 03/19/2011  . Smokeless tobacco: Never Used  . Alcohol use No  . Drug use: No     Comment: 09/08/2014 "I have used marijuana till the 1990's". Notes that he quit 15 yrs ago  . Sexual activity: Not Currently   Other Topics Concern  . Not on file   Social History Narrative  . No narrative on file   Previously worked Education officer, [employer]. Lives with his daughter and her finance.  FAMILY HISTORY: Family History  Problem Relation Age of Onset  . Hypertension Mother   . Diabetes Mother   . Heart disease Mother   . Skin cancer Mother   . Hypertension Father   . Diabetes Father   . Diabetes Sister     ALLERGIES:  is allergic to no known allergies.  MEDICATIONS:  Current Outpatient Prescriptions  Medication Sig Dispense Refill  . acetaminophen (TYLENOL) 325 MG tablet Take 325-650 mg by mouth every 6 (six) hours as needed (back pain).    [name]  acyclovir (ZOVIRAX) 400 MG tablet TAKE 1 TABLET BY MOUTH DAILY. (DOSE ADJUSTED FOR RENAL FUNCTION) 60 tablet 3  . aspirin EC 81 MG tablet Take 1 tablet (81 mg total) by mouth daily. 30 tablet 11  . calcium acetate (PHOSLO) 667 MG capsule Take 1 capsule (667 mg total) by mouth 3 (three) times daily with meals. 90 capsule 0  . calcium carbonate (TUMS - DOSED IN MG ELEMENTAL CALCIUM) 500 MG chewable tablet Chew 1-2 tablets by mouth 2 (two) times daily as needed for indigestion or heartburn.     . cetirizine (ZYRTEC) 10 MG tablet Take 10 mg by mouth daily.    . Cholecalciferol (VITAMIN D3) 3000 units TABS Take 3,000 Units by mouth daily.    . Cyanocobalamin (VITAMIN B-12) 1000 MCG/15ML LIQD Take 1 mL by mouth daily.    [name] Epoetin Alfa (PROCRIT IJ) Inject as directed every 14 (fourteen) days. Done at [facility] Last injection 01/19/16    . FLONASE 50 MCG/ACT nasal spray Place 1 spray into both nostrils daily as needed. (Patient taking differently: Place 1 spray into both nostrils daily as needed for allergies or rhinitis. )    . gabapentin (NEURONTIN) 250 MG/5ML solution Take 6 mLs (300 mg total) by mouth at bedtime. 470 mL 0  . ixazomib citrate (NINLARO) 3 MG capsule Take 1 capsule (3 mg total) by mouth once a week. (on D1,8 and 15 every 28 days)Take on an empty stomach 1hr before or 2hrs after food. Do not crush, chew, or open. (Patient taking differently: Take 3 mg by mouth every 14 (fourteen) days. Every other Thursday) 6 capsule 2  . LORazepam (ATIVAN) 1 MG tablet Take 1 tablet (1 mg total) by mouth daily as needed for anxiety (nausea). 30 tablet 0  . ondansetron (ZOFRAN) 8 MG tablet Take 8 mg by mouth every 8 (eight) hours as needed for nausea or vomiting. Reported on 07/08/2015     No current facility-administered medications for this visit.     REVIEW OF SYSTEMS:   All other systems were reviewed with the patient and are negative.   PHYSICAL EXAMINATION: ECOG PERFORMANCE STATUS: 2 -  Symptomatic, <50% confined to bed  Vitals:   05/15/16 0939  BP: (!) 148/57  Pulse: 72  Resp: 19  Temp: 97.7 F (36.5 C)   Filed Weights   05/15/16 0939  Weight: 195 lb 8 oz (88.7 kg)    GENERAL:alert, Mild this is due to sinus congestion and sore throat and cough. SKIN: Ecchymosis on upper extremities. EYES: nl EOM, PERL OROPHARYNX: No JVD No cervical adenopathy.  NECK: no JVD LYMPH:  no palpable lymphadenopathy in the cervical, axillary or inguinal LUNGS: clear to auscultation and percussion with normal breathing effort HEART: regular rate & rhythm and 2/6 SM aortic area ABDOMEN:abdomen soft, non-tender and normal bowel sounds Musculoskeletal: B/l trace pitting pedal edema No Tenderness.  No redness.  PSYCH: alert & oriented x 3 with fluent speech NEURO: no focal motor/sensory deficits  LABORATORY DATA:   . CBC Latest Ref Rng & Units 05/15/2016 03/19/2016 02/06/2016  WBC 4.0 - 10.3 10e3/uL 4.7 6.2 6.7  Hemoglobin 13.0 - 17.1 g/dL 10.7(L) 11.8(L) 10.4(L)  Hematocrit 38.4 - 49.9 % 31.9(L) 35.6(L) 32.8(L)  Platelets 140 - 400 10e3/uL 89(L) 105(L) 104(L)   . CMP Latest Ref Rng & Units 05/15/2016 03/19/2016 03/19/2016  Glucose 70 - 140 mg/dl 98 112 -  BUN 7.0 - 26.0 mg/dL 42.7(H) 72.6(H) -  Creatinine 0.7 - 1.3 mg/dL 8.2(HH) 9.9(HH) -  Sodium 136 - 145 mEq/L 141 143 -  Potassium 3.5 - 5.1 mEq/L 4.6 4.4 -  Chloride 101 - 111 mmol/L - - -  CO2 22 - 29 mEq/L 25 21(L) -  Calcium 8.4 - 10.4 mg/dL 9.5 9.4 -  Total Protein 6.4 - 8.3 g/dL 7.9 7.7 7.0  Total Bilirubin 0.20 - 1.20 mg/dL 0.74 0.65 -  Alkaline Phos 40 - 150 U/L 107 82 -  AST 5 - 34 U/L 16 13 -  ALT 0 - 55 U/L 16 10 -     RADIOGRAPHIC STUDIES: I have personally reviewed the radiological images as listed and agreed with the findings in the report.   ASSESSMENT & PLAN:    Jose Garrett is very pleasant 66 yo caucasian male with   1) AL Amyloidosis noted on renal biopsy with nephrotic range proteinuria on urinalysis and  some lambda free light chains on serum and urinary IFE . On diagnosis:  SPEP with small amount of M-spike 0.3  Echo showed no systolic dysfunction but grade 2 diastolic dysfunction. [facility] cardiac staging 1 as per troponin and BNP criteria. Bone marrow with no overt evidence of multiple myeloma. PET/CT scan showed no evidence of bony lesions or lymphadenopathy . Urine showed about 15 g of protein in 24 hours   Patient is status post CyBorD x 6 cycles He was then on maintenance Velcade and had progression of his AL Amyloidosis. He was then switched to Ixazomib and Dexamethasone and has completed 6 cycles without significant toxicities. Echo repeated on 12/06/2015 shows no changes in heart function compared to previous echo from March 2017  Currently on maintenance Ixazomib q2weeks and tolerating it without any significant toxicities.  2) Thrombocytopenia mild likely from the Ixazomib. Patient is already on renally adjusted dose of the medication.  Plan -- No overt limiting toxicity from Ixazomib. Has grade 1 neuropathy from previous Velcade which is stable.  --Given persistent hematologic response and his neuropathy we will continue Ixazomib maintenance day 1 and 15 every 28 days --We'll follow-up on myeloma panel and serum free light chain results from today --continue on low-dose aspirin for VTE prophylaxis given his high risk of venous thromboembolism in the setting of nephrotic syndrome.  -continue Acyclovir for shingles prophylaxis. -Echocardiogram repeat is due in March 2018  (re-ordered)  2) multifactorial anemia - likely multifactorial -from AL amyloidosis, anemia from CKD. Hemoglobin is improved at 11.8 stable and is predominantly due to his significant chronic kidney disease B12 WNL -Previously deficient  Plan -Continue Procrit and iron replacement as per nephrology. -continue B12 replacement SL   3) CKD stage V-related to hypertension and AL Amyloidosis. Has been  started on hemodialysis through a tunnel catheter since his last clinic visit. Had AV fistula placement on 12/27/2015  Plan -Continue hemodialysis as per nephrologist.  -patient is hoping to transition his HD to  AVF and have his catheter removed If possible.  4) Feet tingling and numbness ? Related to AL Amyloidosis vs Velcade vs B12 deficiency stable.  No evidence of change in symptoms. Plan -continue Ixazomib maintenance every other week dosing. -Dose to be taken after hemodialysis session on those days.  -Will need to monitor for neuropathy with his Ixazomib. Given option to hold Ninlaro and monitor . He chooses to continue current maintenance dosing. -continue neurontin '600mg'$  po HS. Might need to start additional '300mg'$  po QAM -continue SL B12 - we will recheck levels next visit  RTC with Dr [name] in 8 weeks with labs and ECHO  All questions were answered. The patient knows to call the clinic with any problems, questions or concerns. I spent 20 minutes counseling the patient face to face. The total time spent in the appointment was 25 minutes and more than 50% was on counseling.   Sullivan Lone MD [place] [facility]  (Office):       [phone] (Work cell):  [phone] (Fax):           [phone]

## 2016-05-15 NOTE — Telephone Encounter (Signed)
Gave patient avs report aqnd appointments for May and echo for 4/24.

## 2016-05-16 DIAGNOSIS — N186 End stage renal disease: Secondary | ICD-10-CM | POA: Diagnosis not present

## 2016-05-16 DIAGNOSIS — D509 Iron deficiency anemia, unspecified: Secondary | ICD-10-CM | POA: Diagnosis not present

## 2016-05-16 DIAGNOSIS — N2581 Secondary hyperparathyroidism of renal origin: Secondary | ICD-10-CM | POA: Diagnosis not present

## 2016-05-16 DIAGNOSIS — D631 Anemia in chronic kidney disease: Secondary | ICD-10-CM | POA: Diagnosis not present

## 2016-05-16 LAB — KAPPA/LAMBDA LIGHT CHAINS
Ig Kappa Free Light Chain: 125.8 mg/L — ABNORMAL HIGH (ref 3.3–19.4)
Ig Lambda Free Light Chain: 89.7 mg/L — ABNORMAL HIGH (ref 5.7–26.3)
KAPPA/LAMBDA FLC RATIO: 1.4 (ref 0.26–1.65)

## 2016-05-18 DIAGNOSIS — D631 Anemia in chronic kidney disease: Secondary | ICD-10-CM | POA: Diagnosis not present

## 2016-05-18 DIAGNOSIS — N2581 Secondary hyperparathyroidism of renal origin: Secondary | ICD-10-CM | POA: Diagnosis not present

## 2016-05-18 DIAGNOSIS — N186 End stage renal disease: Secondary | ICD-10-CM | POA: Diagnosis not present

## 2016-05-18 DIAGNOSIS — D509 Iron deficiency anemia, unspecified: Secondary | ICD-10-CM | POA: Diagnosis not present

## 2016-05-18 LAB — MULTIPLE MYELOMA PANEL, SERUM
ALBUMIN SERPL ELPH-MCNC: 4.1 g/dL (ref 2.9–4.4)
ALBUMIN/GLOB SERPL: 1.4 (ref 0.7–1.7)
ALPHA 1: 0.2 g/dL (ref 0.0–0.4)
ALPHA2 GLOB SERPL ELPH-MCNC: 0.9 g/dL (ref 0.4–1.0)
B-Globulin SerPl Elph-Mcnc: 0.9 g/dL (ref 0.7–1.3)
Gamma Glob SerPl Elph-Mcnc: 1.1 g/dL (ref 0.4–1.8)
Globulin, Total: 3.1 g/dL (ref 2.2–3.9)
IGA/IMMUNOGLOBULIN A, SERUM: 89 mg/dL (ref 61–437)
IGM (IMMUNOGLOBIN M), SRM: 141 mg/dL (ref 20–172)
IgG, Qn, Serum: 890 mg/dL (ref 700–1600)
Total Protein: 7.2 g/dL (ref 6.0–8.5)

## 2016-05-21 DIAGNOSIS — N186 End stage renal disease: Secondary | ICD-10-CM | POA: Diagnosis not present

## 2016-05-21 DIAGNOSIS — N2581 Secondary hyperparathyroidism of renal origin: Secondary | ICD-10-CM | POA: Diagnosis not present

## 2016-05-21 DIAGNOSIS — D509 Iron deficiency anemia, unspecified: Secondary | ICD-10-CM | POA: Diagnosis not present

## 2016-05-21 DIAGNOSIS — D631 Anemia in chronic kidney disease: Secondary | ICD-10-CM | POA: Diagnosis not present

## 2016-05-23 DIAGNOSIS — D509 Iron deficiency anemia, unspecified: Secondary | ICD-10-CM | POA: Diagnosis not present

## 2016-05-23 DIAGNOSIS — N2581 Secondary hyperparathyroidism of renal origin: Secondary | ICD-10-CM | POA: Diagnosis not present

## 2016-05-23 DIAGNOSIS — D631 Anemia in chronic kidney disease: Secondary | ICD-10-CM | POA: Diagnosis not present

## 2016-05-23 DIAGNOSIS — N186 End stage renal disease: Secondary | ICD-10-CM | POA: Diagnosis not present

## 2016-05-25 DIAGNOSIS — N186 End stage renal disease: Secondary | ICD-10-CM | POA: Diagnosis not present

## 2016-05-25 DIAGNOSIS — D631 Anemia in chronic kidney disease: Secondary | ICD-10-CM | POA: Diagnosis not present

## 2016-05-25 DIAGNOSIS — D509 Iron deficiency anemia, unspecified: Secondary | ICD-10-CM | POA: Diagnosis not present

## 2016-05-25 DIAGNOSIS — N2581 Secondary hyperparathyroidism of renal origin: Secondary | ICD-10-CM | POA: Diagnosis not present

## 2016-05-28 ENCOUNTER — Telehealth: Payer: Self-pay | Admitting: Pharmacist

## 2016-05-28 DIAGNOSIS — N186 End stage renal disease: Secondary | ICD-10-CM | POA: Diagnosis not present

## 2016-05-28 DIAGNOSIS — N2581 Secondary hyperparathyroidism of renal origin: Secondary | ICD-10-CM | POA: Diagnosis not present

## 2016-05-28 DIAGNOSIS — D631 Anemia in chronic kidney disease: Secondary | ICD-10-CM | POA: Diagnosis not present

## 2016-05-28 DIAGNOSIS — D509 Iron deficiency anemia, unspecified: Secondary | ICD-10-CM | POA: Diagnosis not present

## 2016-05-28 NOTE — Telephone Encounter (Signed)
Received call today from [facility] (ph# [phone]; ref ID# [id number]).  I confirmed pt is still on maintenance Ninlaro and there are no changes to his current condition.   They will continue to ship drug to pt.  They will contact pt and ask general refill questions.  Kennith Center, Pharm.D., CPP 05/28/2016@3 :[facility]

## 2016-05-30 DIAGNOSIS — D509 Iron deficiency anemia, unspecified: Secondary | ICD-10-CM | POA: Diagnosis not present

## 2016-05-30 DIAGNOSIS — N186 End stage renal disease: Secondary | ICD-10-CM | POA: Diagnosis not present

## 2016-05-30 DIAGNOSIS — N2581 Secondary hyperparathyroidism of renal origin: Secondary | ICD-10-CM | POA: Diagnosis not present

## 2016-05-30 DIAGNOSIS — D631 Anemia in chronic kidney disease: Secondary | ICD-10-CM | POA: Diagnosis not present

## 2016-06-01 DIAGNOSIS — D631 Anemia in chronic kidney disease: Secondary | ICD-10-CM | POA: Diagnosis not present

## 2016-06-01 DIAGNOSIS — D509 Iron deficiency anemia, unspecified: Secondary | ICD-10-CM | POA: Diagnosis not present

## 2016-06-01 DIAGNOSIS — N186 End stage renal disease: Secondary | ICD-10-CM | POA: Diagnosis not present

## 2016-06-01 DIAGNOSIS — N2581 Secondary hyperparathyroidism of renal origin: Secondary | ICD-10-CM | POA: Diagnosis not present

## 2016-06-04 DIAGNOSIS — N186 End stage renal disease: Secondary | ICD-10-CM | POA: Diagnosis not present

## 2016-06-04 DIAGNOSIS — D509 Iron deficiency anemia, unspecified: Secondary | ICD-10-CM | POA: Diagnosis not present

## 2016-06-04 DIAGNOSIS — N2581 Secondary hyperparathyroidism of renal origin: Secondary | ICD-10-CM | POA: Diagnosis not present

## 2016-06-04 DIAGNOSIS — D631 Anemia in chronic kidney disease: Secondary | ICD-10-CM | POA: Diagnosis not present

## 2016-06-06 DIAGNOSIS — D509 Iron deficiency anemia, unspecified: Secondary | ICD-10-CM | POA: Diagnosis not present

## 2016-06-06 DIAGNOSIS — N2581 Secondary hyperparathyroidism of renal origin: Secondary | ICD-10-CM | POA: Diagnosis not present

## 2016-06-06 DIAGNOSIS — D631 Anemia in chronic kidney disease: Secondary | ICD-10-CM | POA: Diagnosis not present

## 2016-06-06 DIAGNOSIS — N186 End stage renal disease: Secondary | ICD-10-CM | POA: Diagnosis not present

## 2016-06-08 DIAGNOSIS — D509 Iron deficiency anemia, unspecified: Secondary | ICD-10-CM | POA: Diagnosis not present

## 2016-06-08 DIAGNOSIS — N2581 Secondary hyperparathyroidism of renal origin: Secondary | ICD-10-CM | POA: Diagnosis not present

## 2016-06-08 DIAGNOSIS — D631 Anemia in chronic kidney disease: Secondary | ICD-10-CM | POA: Diagnosis not present

## 2016-06-08 DIAGNOSIS — N186 End stage renal disease: Secondary | ICD-10-CM | POA: Diagnosis not present

## 2016-06-09 DIAGNOSIS — I129 Hypertensive chronic kidney disease with stage 1 through stage 4 chronic kidney disease, or unspecified chronic kidney disease: Secondary | ICD-10-CM | POA: Diagnosis not present

## 2016-06-09 DIAGNOSIS — Z992 Dependence on renal dialysis: Secondary | ICD-10-CM | POA: Diagnosis not present

## 2016-06-09 DIAGNOSIS — N186 End stage renal disease: Secondary | ICD-10-CM | POA: Diagnosis not present

## 2016-06-11 DIAGNOSIS — D631 Anemia in chronic kidney disease: Secondary | ICD-10-CM | POA: Diagnosis not present

## 2016-06-11 DIAGNOSIS — N2581 Secondary hyperparathyroidism of renal origin: Secondary | ICD-10-CM | POA: Diagnosis not present

## 2016-06-11 DIAGNOSIS — D509 Iron deficiency anemia, unspecified: Secondary | ICD-10-CM | POA: Diagnosis not present

## 2016-06-11 DIAGNOSIS — N186 End stage renal disease: Secondary | ICD-10-CM | POA: Diagnosis not present

## 2016-06-13 DIAGNOSIS — D509 Iron deficiency anemia, unspecified: Secondary | ICD-10-CM | POA: Diagnosis not present

## 2016-06-13 DIAGNOSIS — D631 Anemia in chronic kidney disease: Secondary | ICD-10-CM | POA: Diagnosis not present

## 2016-06-13 DIAGNOSIS — N186 End stage renal disease: Secondary | ICD-10-CM | POA: Diagnosis not present

## 2016-06-13 DIAGNOSIS — N2581 Secondary hyperparathyroidism of renal origin: Secondary | ICD-10-CM | POA: Diagnosis not present

## 2016-06-15 DIAGNOSIS — N186 End stage renal disease: Secondary | ICD-10-CM | POA: Diagnosis not present

## 2016-06-15 DIAGNOSIS — D509 Iron deficiency anemia, unspecified: Secondary | ICD-10-CM | POA: Diagnosis not present

## 2016-06-15 DIAGNOSIS — D631 Anemia in chronic kidney disease: Secondary | ICD-10-CM | POA: Diagnosis not present

## 2016-06-15 DIAGNOSIS — N2581 Secondary hyperparathyroidism of renal origin: Secondary | ICD-10-CM | POA: Diagnosis not present

## 2016-06-18 DIAGNOSIS — N2581 Secondary hyperparathyroidism of renal origin: Secondary | ICD-10-CM | POA: Diagnosis not present

## 2016-06-18 DIAGNOSIS — D509 Iron deficiency anemia, unspecified: Secondary | ICD-10-CM | POA: Diagnosis not present

## 2016-06-18 DIAGNOSIS — D631 Anemia in chronic kidney disease: Secondary | ICD-10-CM | POA: Diagnosis not present

## 2016-06-18 DIAGNOSIS — N186 End stage renal disease: Secondary | ICD-10-CM | POA: Diagnosis not present

## 2016-06-20 DIAGNOSIS — D509 Iron deficiency anemia, unspecified: Secondary | ICD-10-CM | POA: Diagnosis not present

## 2016-06-20 DIAGNOSIS — D631 Anemia in chronic kidney disease: Secondary | ICD-10-CM | POA: Diagnosis not present

## 2016-06-20 DIAGNOSIS — N2581 Secondary hyperparathyroidism of renal origin: Secondary | ICD-10-CM | POA: Diagnosis not present

## 2016-06-20 DIAGNOSIS — N186 End stage renal disease: Secondary | ICD-10-CM | POA: Diagnosis not present

## 2016-06-21 ENCOUNTER — Encounter: Payer: Self-pay | Admitting: *Deleted

## 2016-06-21 ENCOUNTER — Other Ambulatory Visit: Payer: Self-pay | Admitting: *Deleted

## 2016-06-21 DIAGNOSIS — Z992 Dependence on renal dialysis: Secondary | ICD-10-CM | POA: Diagnosis not present

## 2016-06-21 DIAGNOSIS — I871 Compression of vein: Secondary | ICD-10-CM | POA: Diagnosis not present

## 2016-06-21 DIAGNOSIS — E8581 Light chain (AL) amyloidosis: Secondary | ICD-10-CM

## 2016-06-21 DIAGNOSIS — N186 End stage renal disease: Secondary | ICD-10-CM | POA: Diagnosis not present

## 2016-06-21 DIAGNOSIS — T82858A Stenosis of vascular prosthetic devices, implants and grafts, initial encounter: Secondary | ICD-10-CM | POA: Diagnosis not present

## 2016-06-21 MED ORDER — IXAZOMIB CITRATE 3 MG PO CAPS: 3.0000 mg | ORAL_CAPSULE | ORAL | 2 refills | Status: DC

## 2016-06-21 NOTE — Progress Notes (Signed)
Faxed signed prescription to [facility] [phone])

## 2016-06-22 DIAGNOSIS — T82590D Other mechanical complication of surgically created arteriovenous fistula, subsequent encounter: Secondary | ICD-10-CM | POA: Insufficient documentation

## 2016-06-22 DIAGNOSIS — D509 Iron deficiency anemia, unspecified: Secondary | ICD-10-CM | POA: Diagnosis not present

## 2016-06-22 DIAGNOSIS — D631 Anemia in chronic kidney disease: Secondary | ICD-10-CM | POA: Diagnosis not present

## 2016-06-22 DIAGNOSIS — N186 End stage renal disease: Secondary | ICD-10-CM | POA: Diagnosis not present

## 2016-06-22 DIAGNOSIS — N2581 Secondary hyperparathyroidism of renal origin: Secondary | ICD-10-CM | POA: Diagnosis not present

## 2016-06-25 DIAGNOSIS — D631 Anemia in chronic kidney disease: Secondary | ICD-10-CM | POA: Diagnosis not present

## 2016-06-25 DIAGNOSIS — N2581 Secondary hyperparathyroidism of renal origin: Secondary | ICD-10-CM | POA: Diagnosis not present

## 2016-06-25 DIAGNOSIS — N186 End stage renal disease: Secondary | ICD-10-CM | POA: Diagnosis not present

## 2016-06-25 DIAGNOSIS — D509 Iron deficiency anemia, unspecified: Secondary | ICD-10-CM | POA: Diagnosis not present

## 2016-06-27 DIAGNOSIS — N2581 Secondary hyperparathyroidism of renal origin: Secondary | ICD-10-CM | POA: Diagnosis not present

## 2016-06-27 DIAGNOSIS — D631 Anemia in chronic kidney disease: Secondary | ICD-10-CM | POA: Diagnosis not present

## 2016-06-27 DIAGNOSIS — N186 End stage renal disease: Secondary | ICD-10-CM | POA: Diagnosis not present

## 2016-06-27 DIAGNOSIS — D509 Iron deficiency anemia, unspecified: Secondary | ICD-10-CM | POA: Diagnosis not present

## 2016-06-28 DIAGNOSIS — Z992 Dependence on renal dialysis: Secondary | ICD-10-CM | POA: Diagnosis not present

## 2016-06-28 DIAGNOSIS — I871 Compression of vein: Secondary | ICD-10-CM | POA: Diagnosis not present

## 2016-06-28 DIAGNOSIS — N186 End stage renal disease: Secondary | ICD-10-CM | POA: Diagnosis not present

## 2016-06-29 DIAGNOSIS — D631 Anemia in chronic kidney disease: Secondary | ICD-10-CM | POA: Diagnosis not present

## 2016-06-29 DIAGNOSIS — N186 End stage renal disease: Secondary | ICD-10-CM | POA: Diagnosis not present

## 2016-06-29 DIAGNOSIS — D509 Iron deficiency anemia, unspecified: Secondary | ICD-10-CM | POA: Diagnosis not present

## 2016-06-29 DIAGNOSIS — N2581 Secondary hyperparathyroidism of renal origin: Secondary | ICD-10-CM | POA: Diagnosis not present

## 2016-07-02 DIAGNOSIS — D631 Anemia in chronic kidney disease: Secondary | ICD-10-CM | POA: Diagnosis not present

## 2016-07-02 DIAGNOSIS — D509 Iron deficiency anemia, unspecified: Secondary | ICD-10-CM | POA: Diagnosis not present

## 2016-07-02 DIAGNOSIS — N2581 Secondary hyperparathyroidism of renal origin: Secondary | ICD-10-CM | POA: Diagnosis not present

## 2016-07-02 DIAGNOSIS — N186 End stage renal disease: Secondary | ICD-10-CM | POA: Diagnosis not present

## 2016-07-03 ENCOUNTER — Ambulatory Visit (HOSPITAL_COMMUNITY)
Admission: RE | Admit: 2016-07-03 | Discharge: 2016-07-03 | Disposition: A | Discharge status: Home / Self Care | Payer: Medicare Other | Source: Ambulatory Visit | Attending: Hematology | Admitting: Hematology

## 2016-07-03 DIAGNOSIS — E8581 Light chain (AL) amyloidosis: Secondary | ICD-10-CM | POA: Insufficient documentation

## 2016-07-03 DIAGNOSIS — N289 Disorder of kidney and ureter, unspecified: Secondary | ICD-10-CM | POA: Insufficient documentation

## 2016-07-03 DIAGNOSIS — G2581 Restless legs syndrome: Secondary | ICD-10-CM | POA: Diagnosis not present

## 2016-07-03 DIAGNOSIS — K219 Gastro-esophageal reflux disease without esophagitis: Secondary | ICD-10-CM | POA: Diagnosis not present

## 2016-07-03 DIAGNOSIS — I10 Essential (primary) hypertension: Secondary | ICD-10-CM | POA: Diagnosis not present

## 2016-07-03 DIAGNOSIS — I080 Rheumatic disorders of both mitral and aortic valves: Secondary | ICD-10-CM | POA: Insufficient documentation

## 2016-07-03 DIAGNOSIS — E785 Hyperlipidemia, unspecified: Secondary | ICD-10-CM | POA: Diagnosis not present

## 2016-07-03 NOTE — Progress Notes (Signed)
  Echocardiogram 2D Echocardiogram has been performed.  Jose Garrett 07/03/2016, 10:07 AM

## 2016-07-04 DIAGNOSIS — N2581 Secondary hyperparathyroidism of renal origin: Secondary | ICD-10-CM | POA: Diagnosis not present

## 2016-07-04 DIAGNOSIS — D509 Iron deficiency anemia, unspecified: Secondary | ICD-10-CM | POA: Diagnosis not present

## 2016-07-04 DIAGNOSIS — D631 Anemia in chronic kidney disease: Secondary | ICD-10-CM | POA: Diagnosis not present

## 2016-07-04 DIAGNOSIS — N186 End stage renal disease: Secondary | ICD-10-CM | POA: Diagnosis not present

## 2016-07-06 DIAGNOSIS — D509 Iron deficiency anemia, unspecified: Secondary | ICD-10-CM | POA: Diagnosis not present

## 2016-07-06 DIAGNOSIS — N186 End stage renal disease: Secondary | ICD-10-CM | POA: Diagnosis not present

## 2016-07-06 DIAGNOSIS — D631 Anemia in chronic kidney disease: Secondary | ICD-10-CM | POA: Diagnosis not present

## 2016-07-06 DIAGNOSIS — N2581 Secondary hyperparathyroidism of renal origin: Secondary | ICD-10-CM | POA: Diagnosis not present

## 2016-07-09 DIAGNOSIS — N2581 Secondary hyperparathyroidism of renal origin: Secondary | ICD-10-CM | POA: Diagnosis not present

## 2016-07-09 DIAGNOSIS — D509 Iron deficiency anemia, unspecified: Secondary | ICD-10-CM | POA: Diagnosis not present

## 2016-07-09 DIAGNOSIS — D631 Anemia in chronic kidney disease: Secondary | ICD-10-CM | POA: Diagnosis not present

## 2016-07-09 DIAGNOSIS — I129 Hypertensive chronic kidney disease with stage 1 through stage 4 chronic kidney disease, or unspecified chronic kidney disease: Secondary | ICD-10-CM | POA: Diagnosis not present

## 2016-07-09 DIAGNOSIS — N186 End stage renal disease: Secondary | ICD-10-CM | POA: Diagnosis not present

## 2016-07-09 DIAGNOSIS — Z992 Dependence on renal dialysis: Secondary | ICD-10-CM | POA: Diagnosis not present

## 2016-07-11 DIAGNOSIS — D509 Iron deficiency anemia, unspecified: Secondary | ICD-10-CM | POA: Diagnosis not present

## 2016-07-11 DIAGNOSIS — D631 Anemia in chronic kidney disease: Secondary | ICD-10-CM | POA: Diagnosis not present

## 2016-07-11 DIAGNOSIS — N186 End stage renal disease: Secondary | ICD-10-CM | POA: Diagnosis not present

## 2016-07-11 DIAGNOSIS — N2581 Secondary hyperparathyroidism of renal origin: Secondary | ICD-10-CM | POA: Diagnosis not present

## 2016-07-12 ENCOUNTER — Other Ambulatory Visit: Payer: Self-pay | Admitting: *Deleted

## 2016-07-12 ENCOUNTER — Other Ambulatory Visit (HOSPITAL_BASED_OUTPATIENT_CLINIC_OR_DEPARTMENT_OTHER): Payer: Medicare Other

## 2016-07-12 ENCOUNTER — Ambulatory Visit (HOSPITAL_BASED_OUTPATIENT_CLINIC_OR_DEPARTMENT_OTHER): Payer: Medicare Other | Admitting: Hematology

## 2016-07-12 ENCOUNTER — Encounter: Payer: Self-pay | Admitting: Hematology

## 2016-07-12 ENCOUNTER — Telehealth: Payer: Self-pay | Admitting: Hematology

## 2016-07-12 VITALS — BP 144/69 | HR 75 | Temp 97.8°F | Resp 18 | Ht 70.0 in | Wt 199.0 lb

## 2016-07-12 DIAGNOSIS — E8581 Light chain (AL) amyloidosis: Secondary | ICD-10-CM | POA: Diagnosis not present

## 2016-07-12 DIAGNOSIS — N184 Chronic kidney disease, stage 4 (severe): Secondary | ICD-10-CM

## 2016-07-12 DIAGNOSIS — D631 Anemia in chronic kidney disease: Secondary | ICD-10-CM

## 2016-07-12 DIAGNOSIS — E538 Deficiency of other specified B group vitamins: Secondary | ICD-10-CM

## 2016-07-12 DIAGNOSIS — G589 Mononeuropathy, unspecified: Secondary | ICD-10-CM | POA: Diagnosis not present

## 2016-07-12 DIAGNOSIS — G620 Drug-induced polyneuropathy: Secondary | ICD-10-CM | POA: Diagnosis not present

## 2016-07-12 DIAGNOSIS — D696 Thrombocytopenia, unspecified: Secondary | ICD-10-CM

## 2016-07-12 DIAGNOSIS — G629 Polyneuropathy, unspecified: Secondary | ICD-10-CM

## 2016-07-12 DIAGNOSIS — C9000 Multiple myeloma not having achieved remission: Secondary | ICD-10-CM

## 2016-07-12 LAB — COMPREHENSIVE METABOLIC PANEL
ALT: 19 U/L (ref 0–55)
AST: 22 U/L (ref 5–34)
Albumin: 4.3 g/dL (ref 3.5–5.0)
Alkaline Phosphatase: 91 U/L (ref 40–150)
Anion Gap: 14 mEq/L — ABNORMAL HIGH (ref 3–11)
BUN: 33.6 mg/dL — AB (ref 7.0–26.0)
CHLORIDE: 99 meq/L (ref 98–109)
CO2: 28 meq/L (ref 22–29)
CREATININE: 7.9 mg/dL — AB (ref 0.7–1.3)
Calcium: 9.9 mg/dL (ref 8.4–10.4)
EGFR: 7 mL/min/{1.73_m2} — ABNORMAL LOW (ref >90)
GLUCOSE: 121 mg/dL (ref 70–140)
Potassium: 5 mEq/L (ref 3.5–5.1)
SODIUM: 141 meq/L (ref 136–145)
Total Bilirubin: 0.74 mg/dL (ref 0.20–1.20)
Total Protein: 8 g/dL (ref 6.4–8.3)

## 2016-07-12 LAB — CBC & DIFF AND RETIC
BASO%: 0.4 % (ref 0.0–2.0)
Basophils Absolute: 0 10*3/uL (ref 0.0–0.1)
EOS%: 3.4 % (ref 0.0–7.0)
Eosinophils Absolute: 0.2 10*3/uL (ref 0.0–0.5)
HEMATOCRIT: 32.8 % — AB (ref 38.4–49.9)
HGB: 10.8 g/dL — ABNORMAL LOW (ref 13.0–17.1)
Immature Retic Fract: 6.7 % (ref 3.00–10.60)
LYMPH#: 1.2 10*3/uL (ref 0.9–3.3)
LYMPH%: 25.5 % (ref 14.0–49.0)
MCH: 34.2 pg — ABNORMAL HIGH (ref 27.2–33.4)
MCHC: 32.9 g/dL (ref 32.0–36.0)
MCV: 103.8 fL — ABNORMAL HIGH (ref 79.3–98.0)
MONO#: 0.5 10*3/uL (ref 0.1–0.9)
MONO%: 10.9 % (ref 0.0–14.0)
NEUT#: 2.8 10*3/uL (ref 1.5–6.5)
NEUT%: 59.8 % (ref 39.0–75.0)
Platelets: 76 10*3/uL — ABNORMAL LOW (ref 140–400)
RBC: 3.16 10*6/uL — AB (ref 4.20–5.82)
RDW: 15 % — AB (ref 11.0–14.6)
RETIC %: 1.84 % — AB (ref 0.80–1.80)
RETIC CT ABS: 58.14 10*3/uL (ref 34.80–93.90)
WBC: 4.7 10*3/uL (ref 4.0–10.3)

## 2016-07-12 MED ORDER — LORAZEPAM 1 MG PO TABS: 1.0000 mg | ORAL_TABLET | Freq: Every day | ORAL | 0 refills | Status: DC | PRN

## 2016-07-12 MED ORDER — GABAPENTIN 250 MG/5ML PO SOLN: 300.0000 mg | Freq: Every day | ORAL | 0 refills | Status: DC

## 2016-07-12 NOTE — Patient Instructions (Signed)
Thank you for choosing [facility] to provide your oncology and hematology care.  To afford each patient quality time with our providers, please arrive 30 minutes before your scheduled appointment time.  If you arrive late for your appointment, you may be asked to reschedule.  We strive to give you quality time with our providers, and arriving late affects you and other patients whose appointments are after yours.  If you are a no show for multiple scheduled visits, you may be dismissed from the clinic at the providers discretion.   Again, thank you for choosing [facility], our hope is that these requests will decrease the amount of time that you wait before being seen by our physicians.  ______________________________________________________________________ Should you have questions after your visit to the [facility], please contact our office at [phone] between the hours of 8:30 and 4:30 p.m.    Voicemails left after 4:30p.m will not be returned until the following business day.   For prescription refill requests, please have your pharmacy contact us directly.  Please also try to allow 48 hours for prescription requests.   Please contact the scheduling department for questions regarding scheduling.  For scheduling of procedures such as PET scans, CT scans, MRI, Ultrasound, etc please contact central scheduling at [phone].   Resources For Cancer Patients and Caregivers:  American Cancer Society:  800-227-2345  Can help patients locate various types of support and financial assistance Cancer Care: 1-800-813-HOPE (4673) Provides financial assistance, online support groups, medication/co-pay assistance.   [place] DSS:  336-641-3447 Where to apply for food stamps, Medicaid, and utility assistance Medicare Rights Center: 800-333-4114 Helps people with Medicare understand their rights and benefits, navigate the Medicare system, and secure the  quality healthcare they deserve SCAT: 336-333-6589 [place] [employer]'s shared-ride transportation service for eligible riders who have a disability that prevents them from riding the fixed route bus.   For additional information on assistance programs please contact our social worker:   Grier Hock/Abigail Elmore:  [phone] 

## 2016-07-12 NOTE — Progress Notes (Signed)
.    Hematology oncology clinic note  Date of service  .07/12/2016  Patient Care Team: [name], MD as PCP - General (Family Medicine) [name], MD as PCP - Hematology/Oncology (Hematology and Oncology) [name], MD as Consulting Physician (Nephrology)  CHIEF COMPLAINTS: followup for AL amyloidosis.  HISTORY OF PRESENTING ILLNESS: Please see my initial consultation for details on presentation.  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 AL amyloidosis noted on kidney biopsy diagnosed 09/28/2014.    Current treatment Ixazomib maintenance q2weeks   previous treatment  CyBorD x 6 cycles Evaluated for and declined Auto HSCT at [facility]. - maintenance Velcade  Ixazomib + Dexamethasone s/p 6 cycles  INTERVAL HISTORY  [name] is here for his scheduled follow-up for AL amyloidosis.  He notes no acute new concerns. Notes unchanged grade 1 neuropathy. Notes that he is waiting for his right forearm fistula mature so his central venous catheter can be removed notes that he feels wiped out after dialysis. Has been getting as needed IV iron and Aranesp by his nephrologist. We discussed continuing maintenance Ixazomib vs holding this and monitoring and he would like to continue maintenance Ixazomib at this time. Myeloma labs last month showed no M spike and normal serum free kappa and lambda light chain ratio . He had a recent echocardiogram that shows normal ejection fraction with no significant changes in heart function .   MEDICAL HISTORY:  Past Medical History:  Diagnosis Date  . Amyloidosis ([place])   . Anemia Dx 2016  . Chronic diastolic CHF (congestive heart failure), NYHA class 1 ([place])    /hospital problem list 09/08/2014  . Chronic renal disease, stage III    /hospital problem list 09/08/2014  . Diarrhea   . Dyspnea    with exertion  . Enlarged heart 2015  . Family history of adverse  reaction to anesthesia    "my daughter can't take certain anesthesia agents" (09/08/2014)  . GERD (gastroesophageal reflux disease)   . History of blood transfusion 09/08/2014   "got hematoma after renal biopsy & HgB dropped"  . Hyperlipidemia Dx 2012  . Hypertension Dx 2012  . Renal disorder    "there is a spot on my kidney" (09/08/2014)  . Restless legs     SURGICAL HISTORY: Past Surgical History:  Procedure Laterality Date  . AV FISTULA PLACEMENT Right 12/27/2015   Procedure: Right Arm ARTERIOVENOUS (AV) FISTULA CREATION;  Surgeon: Angelia Mould, MD;  Location: [place];  Service: Vascular;  Laterality: Right;  . COLONOSCOPY    . IR GENERIC HISTORICAL  01/27/2016   IR FLUORO GUIDE CV LINE RIGHT 01/27/2016 Arne Cleveland, MD [facility]INTER[facility]  . IR GENERIC HISTORICAL  01/27/2016   IR US GUIDE VASC ACCESS RIGHT 01/27/2016 Arne Cleveland, MD [facility]  . LAPAROSCOPIC CHOLECYSTECTOMY  03/2011  . RENAL BIOPSY, PERCUTANEOUS Right 09/08/2014  . TONSILLECTOMY  ~ 1960    SOCIAL HISTORY: Social History   Social History  . Marital status: Divorced    Spouse name: N/A  . Number of children: N/A  . Years of education: N/A   Occupational History  . Not on file.   Social History Main Topics  . Smoking status: Former Smoker    Packs/day: 2.00    Years: 40.00    Types: Cigarettes    Quit date: 03/19/2011  . Smokeless tobacco: Never Used  . Alcohol use No  . Drug use: No  Comment: 09/08/2014 "I have used marijuana till the 1990's". Notes that he quit 15 yrs ago  . Sexual activity: Not Currently   Other Topics Concern  . Not on file   Social History Narrative  . 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 Lives with his daughter and her finance.  FAMILY HISTORY: Family History  Problem Relation Age of Onset  . Hypertension Mother   . Diabetes Mother   . Heart disease Mother   . Skin cancer Mother   . Hypertension  Father   . Diabetes Father   . Diabetes Sister     ALLERGIES:  is allergic to no known allergies.  MEDICATIONS:  Current Outpatient Prescriptions  Medication Sig Dispense Refill  . acetaminophen (TYLENOL) 325 MG tablet Take 325-650 mg by mouth every 6 (six) hours as needed (back pain).    [name] acyclovir (ZOVIRAX) 400 MG tablet TAKE 1 TABLET BY MOUTH DAILY. (DOSE ADJUSTED FOR RENAL FUNCTION) 60 tablet 3  . aspirin EC 81 MG tablet Take 1 tablet (81 mg total) by mouth daily. 30 tablet 11  . calcium acetate (PHOSLO) 667 MG capsule Take 1 capsule (667 mg total) by mouth 3 (three) times daily with meals. 90 capsule 0  . calcium carbonate (TUMS - DOSED IN MG ELEMENTAL CALCIUM) 500 MG chewable tablet Chew 1-2 tablets by mouth 2 (two) times daily as needed for indigestion or heartburn.     . cetirizine (ZYRTEC) 10 MG tablet Take 10 mg by mouth daily.    . Cholecalciferol (VITAMIN D3) 3000 units TABS Take 3,000 Units by mouth daily.    . Cyanocobalamin (VITAMIN B-12) 1000 MCG/15ML LIQD Take 1 mL by mouth daily.    [name] Epoetin Alfa (PROCRIT IJ) Inject as directed every 14 (fourteen) days. Done at [facility] Last injection 01/19/16    . FLONASE 50 MCG/ACT nasal spray Place 1 spray into both nostrils daily as needed. (Patient taking differently: Place 1 spray into both nostrils daily as needed for allergies or rhinitis. )    . gabapentin (NEURONTIN) 250 MG/5ML solution Take 6 mLs (300 mg total) by mouth at bedtime. 470 mL 0  . ixazomib citrate (NINLARO) 3 MG capsule Take 1 capsule (3 mg total) by mouth every 14 (fourteen) days. Every other Thursday 6 capsule 2  . LORazepam (ATIVAN) 1 MG tablet Take 1 tablet (1 mg total) by mouth daily as needed for anxiety (nausea). 30 tablet 0  . ondansetron (ZOFRAN) 8 MG tablet Take 8 mg by mouth every 8 (eight) hours as needed for nausea or vomiting. Reported on 07/08/2015     No current facility-administered medications for this visit.     REVIEW OF SYSTEMS:     All other systems were reviewed with the patient and are negative.   PHYSICAL EXAMINATION: ECOG PERFORMANCE STATUS: 2 - Symptomatic, <50% confined to bed  Vitals:   07/12/16 1003  BP: (!) 144/69  Pulse: 75  Resp: 18  Temp: 97.8 F (36.6 C)   Filed Weights   07/12/16 1003  Weight: 199 lb (90.3 kg)    GENERAL:alert, Mild this is due to sinus congestion and sore throat and cough. SKIN: Ecchymosis on upper extremities. EYES: nl EOM, PERL OROPHARYNX: No JVD No cervical adenopathy.  NECK: no JVD LYMPH:  no palpable lymphadenopathy in the cervical, axillary or inguinal LUNGS: clear to auscultation and percussion with normal breathing effort HEART: regular rate & rhythm and 2/6 SM  aortic area ABDOMEN:abdomen soft, non-tender and normal bowel sounds Musculoskeletal: B/l trace pitting pedal edema No Tenderness. No redness.  PSYCH: alert & oriented x 3 with fluent speech NEURO: no focal motor/sensory deficits  LABORATORY DATA:   . CBC Latest Ref Rng & Units 07/12/2016 05/15/2016 03/19/2016  WBC 4.0 - 10.3 10e3/uL 4.7 4.7 6.2  Hemoglobin 13.0 - 17.1 g/dL 10.8(L) 10.7(L) 11.8(L)  Hematocrit 38.4 - 49.9 % 32.8(L) 31.9(L) 35.6(L)  Platelets 140 - 400 10e3/uL 76(L) 89(L) 105(L)   . CMP Latest Ref Rng & Units 07/12/2016 05/15/2016 05/15/2016  Glucose 70 - 140 mg/dl 121 98 -  BUN 7.0 - 26.0 mg/dL 33.6(H) 42.7(H) -  Creatinine 0.7 - 1.3 mg/dL 7.9(HH) 8.2(HH) -  Sodium 136 - 145 mEq/L 141 141 -  Potassium 3.5 - 5.1 mEq/L 5.0 4.6 -  Chloride 101 - 111 mmol/L - - -  CO2 22 - 29 mEq/L 28 25 -  Calcium 8.4 - 10.4 mg/dL 9.9 9.5 -  Total Protein 6.4 - 8.3 g/dL 8.0 7.9 7.2  Total Bilirubin 0.20 - 1.20 mg/dL 0.74 0.74 -  Alkaline Phos 40 - 150 U/L 91 107 -  AST 5 - 34 U/L 22 16 -  ALT 0 - 55 U/L 19 16 -   Transthoracic Echocardiography  Patient:    Dashel, Goines MR #:       [id number] Study Date: 07/03/2016 Gender:     M Age:        66 Height:     177.8 cm Weight:     88.7 kg BSA:         2.11 m^2 Pt. Status: Room:   SONOGRAPHER  [place], Outpatient  ATTENDING    [place], [place], [place]  REFERRING    [place], [place]  cc:  ------------------------------------------------------------------- LV EF: 60% -   65%  ------------------------------------------------------------------- History:   PMH:  On oral chemotherapy for AL Amyloidosis. Restless legs. Renal disorder on HD. Hypertension. Hyperlipidemia. H/O blood transfusion. GERD. Enlarged heart. Dyspnea. AL amyloidosis for cardiac function screening.  ------------------------------------------------------------------- Study Conclusions  - Left ventricle: The cavity size was normal. Wall thickness was   increased in a pattern of moderate LVH. Systolic function was   normal. The estimated ejection fraction was in the range of 60%   to 65%. Wall motion was normal; there were no regional wall   motion abnormalities. Features are consistent with a pseudonormal   left ventricular filling pattern, with concomitant abnormal   relaxation and increased filling pressure (grade 2 diastolic   dysfunction). - Aortic valve: There was mild stenosis. There was mild   regurgitation. Valve area (VTI): 1.7 cm^2. Valve area (Vmax):   1.91 cm^2. Valve area (Vmean): 1.66 cm^2. - Mitral valve: Mildly calcified annulus. There was mild   regurgitation.   RADIOGRAPHIC STUDIES: I have personally reviewed the radiological images as listed and agreed with the findings in the report.   ASSESSMENT & PLAN:    Mr Jose Garrett is very pleasant 66 yo caucasian male with   1) AL Amyloidosis noted on renal biopsy with nephrotic range proteinuria on urinalysis and some lambda free light chains on serum and urinary IFE . On diagnosis:  SPEP with small amount of M-spike 0.3  Echo showed no systolic dysfunction but grade 2 diastolic dysfunction. [facility] cardiac staging 1 as per  troponin and BNP criteria. Bone marrow with no overt evidence of  multiple myeloma. PET/CT scan showed no evidence of bony lesions or lymphadenopathy . Urine showed about 15 g of protein in 24 hours   Patient is status post CyBorD x 6 cycles He was then on maintenance Velcade and had progression of his AL Amyloidosis. He was then switched to Ixazomib and Dexamethasone and has completed 6 cycles without significant toxicities. Echo repeated on 12/06/2015 shows no changes in heart function compared to previous echo from March 2017  ECHO rpt 4/28: stable EF.  Currently on maintenance Ixazomib q2weeks and tolerating it without any significant toxicities.  2) Thrombocytopenia mild likely from the Ixazomib. Patient is already on renally adjusted dose of the medication. PLT 76k - will continue to monitor  Plan -- No overt limiting toxicity from Ixazomib. Has grade 1 neuropathy from previous Velcade which is stable.  --we will continue Ixazomib maintenance day 1 and 15 every 28 days --Would need to hold up his platelet counts drop below 50,000 --We'll follow-up on myeloma panel and serum free light chain results from today --continue on low-dose aspirin for VTE prophylaxis given his high risk of venous thromboembolism in the setting of nephrotic syndrome.  -continue Acyclovir for shingles prophylaxis. -Echocardiogram repeat would be next due in September/October 2018 .  2) multifactorial anemia - likely multifactorial -from AL amyloidosis, anemia from CKD. Hemoglobin is improved at 11.8 stable and is predominantly due to his significant chronic kidney disease B12 WNL -Previously deficient  Plan -Continue ESA's and iron replacement as per nephrology. -continue B12 replacement SL   3) CKD stage V-related to hypertension and AL Amyloidosis. Has been started on hemodialysis through a tunnel catheter since his last clinic visit. Had AV fistula placement on 12/27/2015  Plan -Continue  hemodialysis as per nephrologist.  -patient is hoping to transition his HD to AVF and have his catheter removed If possible.  4) Feet tingling and numbness ? Related to AL Amyloidosis vs Velcade vs B12 deficiency stable.  No evidence of change in symptoms. Plan -continue Ixazomib maintenance every other week dosing. -Dose to be taken after hemodialysis session on those days.  -Will need to monitor for neuropathy with his Ixazomib. Given option to hold Ninlaro and monitor . He chooses to continue current maintenance dosing. -continue neurontin '600mg'$  po HS.  -continue SL B12   B12 levels pending from today   RTC with Dr [name] in 8 weeks with labs and ECHO  All questions were answered. The patient knows to call the clinic with any problems, questions or concerns. I spent 20 minutes counseling the patient face to face. The total time spent in the appointment was 25 minutes and more than 50% was on counseling.   Sullivan Lone MD [place] Hematology/Oncology Physician [facility]  (Office):       [phone] (Work cell):  [phone] (Fax):           [phone]

## 2016-07-12 NOTE — Telephone Encounter (Signed)
Patient did not want AVS or calender - my chart active - scheduled appt per 5/3 los.

## 2016-07-13 DIAGNOSIS — D631 Anemia in chronic kidney disease: Secondary | ICD-10-CM | POA: Diagnosis not present

## 2016-07-13 DIAGNOSIS — D509 Iron deficiency anemia, unspecified: Secondary | ICD-10-CM | POA: Diagnosis not present

## 2016-07-13 DIAGNOSIS — N186 End stage renal disease: Secondary | ICD-10-CM | POA: Diagnosis not present

## 2016-07-13 DIAGNOSIS — N2581 Secondary hyperparathyroidism of renal origin: Secondary | ICD-10-CM | POA: Diagnosis not present

## 2016-07-13 LAB — KAPPA/LAMBDA LIGHT CHAINS
Ig Kappa Free Light Chain: 121.7 mg/L — ABNORMAL HIGH (ref 3.3–19.4)
Ig Lambda Free Light Chain: 104.4 mg/L — ABNORMAL HIGH (ref 5.7–26.3)
Kappa/Lambda FluidC Ratio: 1.17 (ref 0.26–1.65)

## 2016-07-13 LAB — VITAMIN B12: Vitamin B12: 1035 pg/mL (ref 232–1245)

## 2016-07-16 DIAGNOSIS — N2581 Secondary hyperparathyroidism of renal origin: Secondary | ICD-10-CM | POA: Diagnosis not present

## 2016-07-16 DIAGNOSIS — N186 End stage renal disease: Secondary | ICD-10-CM | POA: Diagnosis not present

## 2016-07-16 DIAGNOSIS — D509 Iron deficiency anemia, unspecified: Secondary | ICD-10-CM | POA: Diagnosis not present

## 2016-07-16 DIAGNOSIS — D631 Anemia in chronic kidney disease: Secondary | ICD-10-CM | POA: Diagnosis not present

## 2016-07-17 LAB — MULTIPLE MYELOMA PANEL, SERUM
Albumin SerPl Elph-Mcnc: 4 g/dL (ref 2.9–4.4)
Albumin/Glob SerPl: 1.3 (ref 0.7–1.7)
Alpha 1: 0.3 g/dL (ref 0.0–0.4)
Alpha2 Glob SerPl Elph-Mcnc: 1 g/dL (ref 0.4–1.0)
B-GLOBULIN SERPL ELPH-MCNC: 0.9 g/dL (ref 0.7–1.3)
GLOBULIN, TOTAL: 3.2 g/dL (ref 2.2–3.9)
Gamma Glob SerPl Elph-Mcnc: 1.1 g/dL (ref 0.4–1.8)
IgA, Qn, Serum: 77 mg/dL (ref 61–437)
IgG, Qn, Serum: 883 mg/dL (ref 700–1600)
IgM, Qn, Serum: 127 mg/dL (ref 20–172)
TOTAL PROTEIN: 7.2 g/dL (ref 6.0–8.5)

## 2016-07-18 DIAGNOSIS — N186 End stage renal disease: Secondary | ICD-10-CM | POA: Diagnosis not present

## 2016-07-18 DIAGNOSIS — D509 Iron deficiency anemia, unspecified: Secondary | ICD-10-CM | POA: Diagnosis not present

## 2016-07-18 DIAGNOSIS — N2581 Secondary hyperparathyroidism of renal origin: Secondary | ICD-10-CM | POA: Diagnosis not present

## 2016-07-18 DIAGNOSIS — D631 Anemia in chronic kidney disease: Secondary | ICD-10-CM | POA: Diagnosis not present

## 2016-07-20 DIAGNOSIS — D631 Anemia in chronic kidney disease: Secondary | ICD-10-CM | POA: Diagnosis not present

## 2016-07-20 DIAGNOSIS — D509 Iron deficiency anemia, unspecified: Secondary | ICD-10-CM | POA: Diagnosis not present

## 2016-07-20 DIAGNOSIS — N186 End stage renal disease: Secondary | ICD-10-CM | POA: Diagnosis not present

## 2016-07-20 DIAGNOSIS — N2581 Secondary hyperparathyroidism of renal origin: Secondary | ICD-10-CM | POA: Diagnosis not present

## 2016-07-23 ENCOUNTER — Encounter: Payer: Self-pay | Admitting: Hematology

## 2016-07-23 DIAGNOSIS — D509 Iron deficiency anemia, unspecified: Secondary | ICD-10-CM | POA: Diagnosis not present

## 2016-07-23 DIAGNOSIS — N186 End stage renal disease: Secondary | ICD-10-CM | POA: Diagnosis not present

## 2016-07-23 DIAGNOSIS — D631 Anemia in chronic kidney disease: Secondary | ICD-10-CM | POA: Diagnosis not present

## 2016-07-23 DIAGNOSIS — N2581 Secondary hyperparathyroidism of renal origin: Secondary | ICD-10-CM | POA: Diagnosis not present

## 2016-07-25 ENCOUNTER — Encounter: Payer: Self-pay | Admitting: Family Medicine

## 2016-07-25 DIAGNOSIS — N2581 Secondary hyperparathyroidism of renal origin: Secondary | ICD-10-CM | POA: Diagnosis not present

## 2016-07-25 DIAGNOSIS — D631 Anemia in chronic kidney disease: Secondary | ICD-10-CM | POA: Diagnosis not present

## 2016-07-25 DIAGNOSIS — N186 End stage renal disease: Secondary | ICD-10-CM | POA: Diagnosis not present

## 2016-07-25 DIAGNOSIS — D509 Iron deficiency anemia, unspecified: Secondary | ICD-10-CM | POA: Diagnosis not present

## 2016-07-26 ENCOUNTER — Telehealth: Payer: Self-pay | Admitting: *Deleted

## 2016-07-26 NOTE — Telephone Encounter (Signed)
-----   Message from [name], MD sent at 07/25/2016  7:20 PM EDT ----- Jose Garrett let Mr Jose Garrett know that his Myeloma panel and SFLC continue to show that he is in hematologic remission. thx GK

## 2016-07-26 NOTE — Telephone Encounter (Signed)
Patient informed and verbalized understanding

## 2016-07-27 DIAGNOSIS — N186 End stage renal disease: Secondary | ICD-10-CM | POA: Diagnosis not present

## 2016-07-27 DIAGNOSIS — D509 Iron deficiency anemia, unspecified: Secondary | ICD-10-CM | POA: Diagnosis not present

## 2016-07-27 DIAGNOSIS — D631 Anemia in chronic kidney disease: Secondary | ICD-10-CM | POA: Diagnosis not present

## 2016-07-27 DIAGNOSIS — N2581 Secondary hyperparathyroidism of renal origin: Secondary | ICD-10-CM | POA: Diagnosis not present

## 2016-07-30 DIAGNOSIS — N186 End stage renal disease: Secondary | ICD-10-CM | POA: Diagnosis not present

## 2016-07-30 DIAGNOSIS — D631 Anemia in chronic kidney disease: Secondary | ICD-10-CM | POA: Diagnosis not present

## 2016-07-30 DIAGNOSIS — D509 Iron deficiency anemia, unspecified: Secondary | ICD-10-CM | POA: Diagnosis not present

## 2016-07-30 DIAGNOSIS — N2581 Secondary hyperparathyroidism of renal origin: Secondary | ICD-10-CM | POA: Diagnosis not present

## 2016-08-01 DIAGNOSIS — N2581 Secondary hyperparathyroidism of renal origin: Secondary | ICD-10-CM | POA: Diagnosis not present

## 2016-08-01 DIAGNOSIS — D631 Anemia in chronic kidney disease: Secondary | ICD-10-CM | POA: Diagnosis not present

## 2016-08-01 DIAGNOSIS — D509 Iron deficiency anemia, unspecified: Secondary | ICD-10-CM | POA: Diagnosis not present

## 2016-08-01 DIAGNOSIS — N186 End stage renal disease: Secondary | ICD-10-CM | POA: Diagnosis not present

## 2016-08-03 DIAGNOSIS — D509 Iron deficiency anemia, unspecified: Secondary | ICD-10-CM | POA: Diagnosis not present

## 2016-08-03 DIAGNOSIS — D631 Anemia in chronic kidney disease: Secondary | ICD-10-CM | POA: Diagnosis not present

## 2016-08-03 DIAGNOSIS — N186 End stage renal disease: Secondary | ICD-10-CM | POA: Diagnosis not present

## 2016-08-03 DIAGNOSIS — N2581 Secondary hyperparathyroidism of renal origin: Secondary | ICD-10-CM | POA: Diagnosis not present

## 2016-08-06 DIAGNOSIS — D631 Anemia in chronic kidney disease: Secondary | ICD-10-CM | POA: Diagnosis not present

## 2016-08-06 DIAGNOSIS — N186 End stage renal disease: Secondary | ICD-10-CM | POA: Diagnosis not present

## 2016-08-06 DIAGNOSIS — D509 Iron deficiency anemia, unspecified: Secondary | ICD-10-CM | POA: Diagnosis not present

## 2016-08-06 DIAGNOSIS — N2581 Secondary hyperparathyroidism of renal origin: Secondary | ICD-10-CM | POA: Diagnosis not present

## 2016-08-08 DIAGNOSIS — N2581 Secondary hyperparathyroidism of renal origin: Secondary | ICD-10-CM | POA: Diagnosis not present

## 2016-08-08 DIAGNOSIS — D509 Iron deficiency anemia, unspecified: Secondary | ICD-10-CM | POA: Diagnosis not present

## 2016-08-08 DIAGNOSIS — N186 End stage renal disease: Secondary | ICD-10-CM | POA: Diagnosis not present

## 2016-08-08 DIAGNOSIS — D631 Anemia in chronic kidney disease: Secondary | ICD-10-CM | POA: Diagnosis not present

## 2016-08-09 DIAGNOSIS — I129 Hypertensive chronic kidney disease with stage 1 through stage 4 chronic kidney disease, or unspecified chronic kidney disease: Secondary | ICD-10-CM | POA: Diagnosis not present

## 2016-08-09 DIAGNOSIS — Z992 Dependence on renal dialysis: Secondary | ICD-10-CM | POA: Diagnosis not present

## 2016-08-09 DIAGNOSIS — N186 End stage renal disease: Secondary | ICD-10-CM | POA: Diagnosis not present

## 2016-08-10 DIAGNOSIS — D509 Iron deficiency anemia, unspecified: Secondary | ICD-10-CM | POA: Diagnosis not present

## 2016-08-10 DIAGNOSIS — D631 Anemia in chronic kidney disease: Secondary | ICD-10-CM | POA: Diagnosis not present

## 2016-08-10 DIAGNOSIS — Z23 Encounter for immunization: Secondary | ICD-10-CM | POA: Diagnosis not present

## 2016-08-10 DIAGNOSIS — N2581 Secondary hyperparathyroidism of renal origin: Secondary | ICD-10-CM | POA: Diagnosis not present

## 2016-08-10 DIAGNOSIS — N186 End stage renal disease: Secondary | ICD-10-CM | POA: Diagnosis not present

## 2016-08-13 DIAGNOSIS — D509 Iron deficiency anemia, unspecified: Secondary | ICD-10-CM | POA: Diagnosis not present

## 2016-08-13 DIAGNOSIS — N2581 Secondary hyperparathyroidism of renal origin: Secondary | ICD-10-CM | POA: Diagnosis not present

## 2016-08-13 DIAGNOSIS — Z23 Encounter for immunization: Secondary | ICD-10-CM | POA: Diagnosis not present

## 2016-08-13 DIAGNOSIS — N186 End stage renal disease: Secondary | ICD-10-CM | POA: Diagnosis not present

## 2016-08-13 DIAGNOSIS — D631 Anemia in chronic kidney disease: Secondary | ICD-10-CM | POA: Diagnosis not present

## 2016-08-14 DIAGNOSIS — Z992 Dependence on renal dialysis: Secondary | ICD-10-CM | POA: Diagnosis not present

## 2016-08-14 DIAGNOSIS — N186 End stage renal disease: Secondary | ICD-10-CM | POA: Diagnosis not present

## 2016-08-14 DIAGNOSIS — T82858A Stenosis of vascular prosthetic devices, implants and grafts, initial encounter: Secondary | ICD-10-CM | POA: Diagnosis not present

## 2016-08-14 DIAGNOSIS — I771 Stricture of artery: Secondary | ICD-10-CM | POA: Diagnosis not present

## 2016-08-15 DIAGNOSIS — D631 Anemia in chronic kidney disease: Secondary | ICD-10-CM | POA: Diagnosis not present

## 2016-08-15 DIAGNOSIS — Z23 Encounter for immunization: Secondary | ICD-10-CM | POA: Diagnosis not present

## 2016-08-15 DIAGNOSIS — Z4802 Encounter for removal of sutures: Secondary | ICD-10-CM | POA: Insufficient documentation

## 2016-08-15 DIAGNOSIS — D509 Iron deficiency anemia, unspecified: Secondary | ICD-10-CM | POA: Diagnosis not present

## 2016-08-15 DIAGNOSIS — N2581 Secondary hyperparathyroidism of renal origin: Secondary | ICD-10-CM | POA: Diagnosis not present

## 2016-08-15 DIAGNOSIS — N186 End stage renal disease: Secondary | ICD-10-CM | POA: Diagnosis not present

## 2016-08-17 DIAGNOSIS — D509 Iron deficiency anemia, unspecified: Secondary | ICD-10-CM | POA: Diagnosis not present

## 2016-08-17 DIAGNOSIS — Z23 Encounter for immunization: Secondary | ICD-10-CM | POA: Diagnosis not present

## 2016-08-17 DIAGNOSIS — D631 Anemia in chronic kidney disease: Secondary | ICD-10-CM | POA: Diagnosis not present

## 2016-08-17 DIAGNOSIS — N186 End stage renal disease: Secondary | ICD-10-CM | POA: Diagnosis not present

## 2016-08-17 DIAGNOSIS — N2581 Secondary hyperparathyroidism of renal origin: Secondary | ICD-10-CM | POA: Diagnosis not present

## 2016-08-20 DIAGNOSIS — D509 Iron deficiency anemia, unspecified: Secondary | ICD-10-CM | POA: Diagnosis not present

## 2016-08-20 DIAGNOSIS — Z23 Encounter for immunization: Secondary | ICD-10-CM | POA: Diagnosis not present

## 2016-08-20 DIAGNOSIS — D631 Anemia in chronic kidney disease: Secondary | ICD-10-CM | POA: Diagnosis not present

## 2016-08-20 DIAGNOSIS — N186 End stage renal disease: Secondary | ICD-10-CM | POA: Diagnosis not present

## 2016-08-20 DIAGNOSIS — N2581 Secondary hyperparathyroidism of renal origin: Secondary | ICD-10-CM | POA: Diagnosis not present

## 2016-08-22 DIAGNOSIS — N186 End stage renal disease: Secondary | ICD-10-CM | POA: Diagnosis not present

## 2016-08-22 DIAGNOSIS — D631 Anemia in chronic kidney disease: Secondary | ICD-10-CM | POA: Diagnosis not present

## 2016-08-22 DIAGNOSIS — D509 Iron deficiency anemia, unspecified: Secondary | ICD-10-CM | POA: Diagnosis not present

## 2016-08-22 DIAGNOSIS — Z23 Encounter for immunization: Secondary | ICD-10-CM | POA: Diagnosis not present

## 2016-08-22 DIAGNOSIS — N2581 Secondary hyperparathyroidism of renal origin: Secondary | ICD-10-CM | POA: Diagnosis not present

## 2016-08-24 ENCOUNTER — Telehealth: Payer: Self-pay | Admitting: Hematology

## 2016-08-24 DIAGNOSIS — N186 End stage renal disease: Secondary | ICD-10-CM | POA: Diagnosis not present

## 2016-08-24 DIAGNOSIS — D509 Iron deficiency anemia, unspecified: Secondary | ICD-10-CM | POA: Diagnosis not present

## 2016-08-24 DIAGNOSIS — D631 Anemia in chronic kidney disease: Secondary | ICD-10-CM | POA: Diagnosis not present

## 2016-08-24 DIAGNOSIS — Z23 Encounter for immunization: Secondary | ICD-10-CM | POA: Diagnosis not present

## 2016-08-24 DIAGNOSIS — N2581 Secondary hyperparathyroidism of renal origin: Secondary | ICD-10-CM | POA: Diagnosis not present

## 2016-08-24 NOTE — Telephone Encounter (Signed)
Left a VM  About about appointment changes

## 2016-08-27 DIAGNOSIS — D631 Anemia in chronic kidney disease: Secondary | ICD-10-CM | POA: Diagnosis not present

## 2016-08-27 DIAGNOSIS — N186 End stage renal disease: Secondary | ICD-10-CM | POA: Diagnosis not present

## 2016-08-27 DIAGNOSIS — N2581 Secondary hyperparathyroidism of renal origin: Secondary | ICD-10-CM | POA: Diagnosis not present

## 2016-08-27 DIAGNOSIS — D509 Iron deficiency anemia, unspecified: Secondary | ICD-10-CM | POA: Diagnosis not present

## 2016-08-27 DIAGNOSIS — Z23 Encounter for immunization: Secondary | ICD-10-CM | POA: Diagnosis not present

## 2016-08-29 DIAGNOSIS — N2581 Secondary hyperparathyroidism of renal origin: Secondary | ICD-10-CM | POA: Diagnosis not present

## 2016-08-29 DIAGNOSIS — Z23 Encounter for immunization: Secondary | ICD-10-CM | POA: Diagnosis not present

## 2016-08-29 DIAGNOSIS — D631 Anemia in chronic kidney disease: Secondary | ICD-10-CM | POA: Diagnosis not present

## 2016-08-29 DIAGNOSIS — N186 End stage renal disease: Secondary | ICD-10-CM | POA: Diagnosis not present

## 2016-08-29 DIAGNOSIS — D509 Iron deficiency anemia, unspecified: Secondary | ICD-10-CM | POA: Diagnosis not present

## 2016-08-31 DIAGNOSIS — Z23 Encounter for immunization: Secondary | ICD-10-CM | POA: Diagnosis not present

## 2016-08-31 DIAGNOSIS — D631 Anemia in chronic kidney disease: Secondary | ICD-10-CM | POA: Diagnosis not present

## 2016-08-31 DIAGNOSIS — N2581 Secondary hyperparathyroidism of renal origin: Secondary | ICD-10-CM | POA: Diagnosis not present

## 2016-08-31 DIAGNOSIS — N186 End stage renal disease: Secondary | ICD-10-CM | POA: Diagnosis not present

## 2016-08-31 DIAGNOSIS — D509 Iron deficiency anemia, unspecified: Secondary | ICD-10-CM | POA: Diagnosis not present

## 2016-09-03 DIAGNOSIS — D631 Anemia in chronic kidney disease: Secondary | ICD-10-CM | POA: Diagnosis not present

## 2016-09-03 DIAGNOSIS — D509 Iron deficiency anemia, unspecified: Secondary | ICD-10-CM | POA: Diagnosis not present

## 2016-09-03 DIAGNOSIS — Z23 Encounter for immunization: Secondary | ICD-10-CM | POA: Diagnosis not present

## 2016-09-03 DIAGNOSIS — N186 End stage renal disease: Secondary | ICD-10-CM | POA: Diagnosis not present

## 2016-09-03 DIAGNOSIS — N2581 Secondary hyperparathyroidism of renal origin: Secondary | ICD-10-CM | POA: Diagnosis not present

## 2016-09-05 DIAGNOSIS — D631 Anemia in chronic kidney disease: Secondary | ICD-10-CM | POA: Diagnosis not present

## 2016-09-05 DIAGNOSIS — N2581 Secondary hyperparathyroidism of renal origin: Secondary | ICD-10-CM | POA: Diagnosis not present

## 2016-09-05 DIAGNOSIS — D509 Iron deficiency anemia, unspecified: Secondary | ICD-10-CM | POA: Diagnosis not present

## 2016-09-05 DIAGNOSIS — Z23 Encounter for immunization: Secondary | ICD-10-CM | POA: Diagnosis not present

## 2016-09-05 DIAGNOSIS — N186 End stage renal disease: Secondary | ICD-10-CM | POA: Diagnosis not present

## 2016-09-06 ENCOUNTER — Ambulatory Visit (HOSPITAL_BASED_OUTPATIENT_CLINIC_OR_DEPARTMENT_OTHER): Payer: Medicare Other | Admitting: Hematology

## 2016-09-06 ENCOUNTER — Other Ambulatory Visit: Payer: Self-pay

## 2016-09-06 ENCOUNTER — Encounter: Payer: Self-pay | Admitting: Hematology

## 2016-09-06 ENCOUNTER — Other Ambulatory Visit (HOSPITAL_BASED_OUTPATIENT_CLINIC_OR_DEPARTMENT_OTHER): Payer: Medicare Other

## 2016-09-06 ENCOUNTER — Telehealth: Payer: Self-pay | Admitting: Hematology

## 2016-09-06 VITALS — BP 156/61 | HR 81 | Temp 98.2°F | Resp 20 | Ht 70.0 in | Wt 197.8 lb

## 2016-09-06 DIAGNOSIS — G629 Polyneuropathy, unspecified: Secondary | ICD-10-CM

## 2016-09-06 DIAGNOSIS — E538 Deficiency of other specified B group vitamins: Secondary | ICD-10-CM

## 2016-09-06 DIAGNOSIS — D696 Thrombocytopenia, unspecified: Secondary | ICD-10-CM | POA: Diagnosis not present

## 2016-09-06 DIAGNOSIS — E8581 Light chain (AL) amyloidosis: Secondary | ICD-10-CM

## 2016-09-06 DIAGNOSIS — C9000 Multiple myeloma not having achieved remission: Secondary | ICD-10-CM

## 2016-09-06 DIAGNOSIS — N183 Chronic kidney disease, stage 3 (moderate): Secondary | ICD-10-CM | POA: Diagnosis not present

## 2016-09-06 LAB — COMPREHENSIVE METABOLIC PANEL
ALBUMIN: 4.1 g/dL (ref 3.5–5.0)
ALK PHOS: 86 U/L (ref 40–150)
ALT: 20 U/L (ref 0–55)
AST: 21 U/L (ref 5–34)
Anion Gap: 15 mEq/L — ABNORMAL HIGH (ref 3–11)
BILIRUBIN TOTAL: 0.82 mg/dL (ref 0.20–1.20)
BUN: 33.7 mg/dL — AB (ref 7.0–26.0)
CALCIUM: 10.3 mg/dL (ref 8.4–10.4)
CO2: 27 mEq/L (ref 22–29)
CREATININE: 8 mg/dL — AB (ref 0.7–1.3)
Chloride: 98 mEq/L (ref 98–109)
EGFR: 6 mL/min/{1.73_m2} — ABNORMAL LOW (ref >90)
Glucose: 133 mg/dl (ref 70–140)
Potassium: 4.4 mEq/L (ref 3.5–5.1)
Sodium: 139 mEq/L (ref 136–145)
Total Protein: 7.8 g/dL (ref 6.4–8.3)

## 2016-09-06 LAB — CBC & DIFF AND RETIC
BASO%: 0.4 % (ref 0.0–2.0)
Basophils Absolute: 0 10*3/uL (ref 0.0–0.1)
EOS%: 3.1 % (ref 0.0–7.0)
Eosinophils Absolute: 0.2 10*3/uL (ref 0.0–0.5)
HCT: 32.7 % — ABNORMAL LOW (ref 38.4–49.9)
HGB: 10.9 g/dL — ABNORMAL LOW (ref 13.0–17.1)
Immature Retic Fract: 5.3 % (ref 3.00–10.60)
LYMPH#: 1 10*3/uL (ref 0.9–3.3)
LYMPH%: 20 % (ref 14.0–49.0)
MCH: 34.2 pg — AB (ref 27.2–33.4)
MCHC: 33.3 g/dL (ref 32.0–36.0)
MCV: 102.5 fL — ABNORMAL HIGH (ref 79.3–98.0)
MONO#: 0.6 10*3/uL (ref 0.1–0.9)
MONO%: 11.4 % (ref 0.0–14.0)
NEUT#: 3.3 10*3/uL (ref 1.5–6.5)
NEUT%: 65.1 % (ref 39.0–75.0)
PLATELETS: 71 10*3/uL — AB (ref 140–400)
RBC: 3.19 10*6/uL — AB (ref 4.20–5.82)
RDW: 14.3 % (ref 11.0–14.6)
RETIC CT ABS: 43.38 10*3/uL (ref 34.80–93.90)
Retic %: 1.36 % (ref 0.80–1.80)
WBC: 5.1 10*3/uL (ref 4.0–10.3)

## 2016-09-06 MED ORDER — LORAZEPAM 1 MG PO TABS: 1.0000 mg | ORAL_TABLET | Freq: Every day | ORAL | 0 refills | Status: DC | PRN

## 2016-09-06 NOTE — Telephone Encounter (Signed)
Scheduled appt per 6/28 los - patient did not want AVS or calender - my chart active .

## 2016-09-06 NOTE — Patient Instructions (Signed)
Thank you for choosing [facility] to provide your oncology and hematology care.  To afford each patient quality time with our providers, please arrive 30 minutes before your scheduled appointment time.  If you arrive late for your appointment, you may be asked to reschedule.  We strive to give you quality time with our providers, and arriving late affects you and other patients whose appointments are after yours.  If you are a no show for multiple scheduled visits, you may be dismissed from the clinic at the providers discretion.   Again, thank you for choosing [facility], our hope is that these requests will decrease the amount of time that you wait before being seen by our physicians.  ______________________________________________________________________ Should you have questions after your visit to the [facility], please contact our office at [phone] between the hours of 8:30 and 4:30 p.m.    Voicemails left after 4:30p.m will not be returned until the following business day.   For prescription refill requests, please have your pharmacy contact us directly.  Please also try to allow 48 hours for prescription requests.   Please contact the scheduling department for questions regarding scheduling.  For scheduling of procedures such as PET scans, CT scans, MRI, Ultrasound, etc please contact central scheduling at [phone].   Resources For Cancer Patients and Caregivers:  American Cancer Society:  800-227-2345  Can help patients locate various types of support and financial assistance Cancer Care: 1-800-813-HOPE (4673) Provides financial assistance, online support groups, medication/co-pay assistance.   [place] DSS:  336-641-3447 Where to apply for food stamps, Medicaid, and utility assistance Medicare Rights Center: 800-333-4114 Helps people with Medicare understand their rights and benefits, navigate the Medicare system, and secure the  quality healthcare they deserve SCAT: 336-333-6589 [employer] [employer]'s shared-ride transportation service for eligible riders who have a disability that prevents them from riding the fixed route bus.   For additional information on assistance programs please contact our social worker:   Grier Hock/Abigail Elmore:  [phone] 

## 2016-09-07 DIAGNOSIS — Z23 Encounter for immunization: Secondary | ICD-10-CM | POA: Diagnosis not present

## 2016-09-07 DIAGNOSIS — N2581 Secondary hyperparathyroidism of renal origin: Secondary | ICD-10-CM | POA: Diagnosis not present

## 2016-09-07 DIAGNOSIS — D509 Iron deficiency anemia, unspecified: Secondary | ICD-10-CM | POA: Diagnosis not present

## 2016-09-07 DIAGNOSIS — N186 End stage renal disease: Secondary | ICD-10-CM | POA: Diagnosis not present

## 2016-09-07 DIAGNOSIS — D631 Anemia in chronic kidney disease: Secondary | ICD-10-CM | POA: Diagnosis not present

## 2016-09-07 LAB — KAPPA/LAMBDA LIGHT CHAINS
IG KAPPA FREE LIGHT CHAIN: 128 mg/L — AB (ref 3.3–19.4)
Ig Lambda Free Light Chain: 111.1 mg/L — ABNORMAL HIGH (ref 5.7–26.3)
KAPPA/LAMBDA FLC RATIO: 1.15 (ref 0.26–1.65)

## 2016-09-08 DIAGNOSIS — I129 Hypertensive chronic kidney disease with stage 1 through stage 4 chronic kidney disease, or unspecified chronic kidney disease: Secondary | ICD-10-CM | POA: Diagnosis not present

## 2016-09-08 DIAGNOSIS — N186 End stage renal disease: Secondary | ICD-10-CM | POA: Diagnosis not present

## 2016-09-08 DIAGNOSIS — Z992 Dependence on renal dialysis: Secondary | ICD-10-CM | POA: Diagnosis not present

## 2016-09-10 DIAGNOSIS — N2581 Secondary hyperparathyroidism of renal origin: Secondary | ICD-10-CM | POA: Diagnosis not present

## 2016-09-10 DIAGNOSIS — D509 Iron deficiency anemia, unspecified: Secondary | ICD-10-CM | POA: Diagnosis not present

## 2016-09-10 DIAGNOSIS — D631 Anemia in chronic kidney disease: Secondary | ICD-10-CM | POA: Diagnosis not present

## 2016-09-10 DIAGNOSIS — N186 End stage renal disease: Secondary | ICD-10-CM | POA: Diagnosis not present

## 2016-09-10 LAB — MULTIPLE MYELOMA PANEL, SERUM
ALBUMIN SERPL ELPH-MCNC: 4.1 g/dL (ref 2.9–4.4)
Albumin/Glob SerPl: 1.4 (ref 0.7–1.7)
Alpha 1: 0.2 g/dL (ref 0.0–0.4)
Alpha2 Glob SerPl Elph-Mcnc: 0.9 g/dL (ref 0.4–1.0)
B-Globulin SerPl Elph-Mcnc: 0.9 g/dL (ref 0.7–1.3)
GAMMA GLOB SERPL ELPH-MCNC: 1.1 g/dL (ref 0.4–1.8)
GLOBULIN, TOTAL: 3.1 g/dL (ref 2.2–3.9)
IgA, Qn, Serum: 102 mg/dL (ref 61–437)
IgG, Qn, Serum: 947 mg/dL (ref 700–1600)
IgM, Qn, Serum: 138 mg/dL (ref 20–172)
Total Protein: 7.2 g/dL (ref 6.0–8.5)

## 2016-09-11 ENCOUNTER — Other Ambulatory Visit: Payer: Medicare Other

## 2016-09-11 ENCOUNTER — Ambulatory Visit: Payer: Medicare Other | Admitting: Hematology

## 2016-09-11 DIAGNOSIS — Z452 Encounter for adjustment and management of vascular access device: Secondary | ICD-10-CM | POA: Diagnosis not present

## 2016-09-12 DIAGNOSIS — D631 Anemia in chronic kidney disease: Secondary | ICD-10-CM | POA: Diagnosis not present

## 2016-09-12 DIAGNOSIS — N186 End stage renal disease: Secondary | ICD-10-CM | POA: Diagnosis not present

## 2016-09-12 DIAGNOSIS — D509 Iron deficiency anemia, unspecified: Secondary | ICD-10-CM | POA: Diagnosis not present

## 2016-09-12 DIAGNOSIS — N2581 Secondary hyperparathyroidism of renal origin: Secondary | ICD-10-CM | POA: Diagnosis not present

## 2016-09-12 NOTE — Progress Notes (Signed)
.    Hematology oncology clinic note  Date of service  .09/06/2016  Patient Care Team: [name], MD as PCP - General (Family Medicine) [name], MD as PCP - Hematology/Oncology (Hematology and Oncology) [name], MD as Consulting Physician (Nephrology)  CHIEF COMPLAINTS: followup for AL amyloidosis.  HISTORY OF PRESENTING ILLNESS: Please see my initial consultation for details on presentation.  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 AL amyloidosis noted on kidney biopsy diagnosed 09/28/2014.    Current treatment Ixazomib maintenance q2weeks   previous treatment  CyBorD x 6 cycles Evaluated for and declined Auto HSCT at [facility]. - maintenance Velcade  Ixazomib + Dexamethasone s/p 6 cycles  INTERVAL HISTORY  Jose Garrett is here for his scheduled follow-up for AL amyloidosis.  He notes no acute new concerns. Notes unchanged grade 1 neuropathy with symptoms controlled with Neurontin. Notes that he is waiting for his right forearm fistula is being used but needed some intervention to help it function better  Has been getting as needed IV iron and Aranesp by his nephrologist. Myeloma labs last month showed no M spike and normal serum free kappa and lambda light chain ratio showing he continues to be in hematologic remission.  Last echocardiogram was on 07/07/2016 and showed normal ejection fraction with no significant changes in heart function . No other acute new concerns  MEDICAL HISTORY:  Past Medical History:  Diagnosis Date  . Amyloidosis ([place])   . Anemia Dx 2016  . Chronic diastolic CHF (congestive heart failure), NYHA class 1 ([place])    /hospital problem list 09/08/2014  . Chronic renal disease, stage III    /hospital problem list 09/08/2014  . Diarrhea   . Dyspnea    with exertion  . Enlarged heart 2015  . Family history of adverse reaction to anesthesia    "my daughter  can't take certain anesthesia agents" (09/08/2014)  . GERD (gastroesophageal reflux disease)   . History of blood transfusion 09/08/2014   "got hematoma after renal biopsy & HgB dropped"  . Hyperlipidemia Dx 2012  . Hypertension Dx 2012  . Renal disorder    "there is a spot on my kidney" (09/08/2014)  . Restless legs     SURGICAL HISTORY: Past Surgical History:  Procedure Laterality Date  . AV FISTULA PLACEMENT Right 12/27/2015   Procedure: Right Arm ARTERIOVENOUS (AV) FISTULA CREATION;  Surgeon: Angelia Mould, MD;  Location: [facility];  Service: Vascular;  Laterality: Right;  . COLONOSCOPY    . IR GENERIC HISTORICAL  01/27/2016   IR FLUORO GUIDE CV LINE RIGHT 01/27/2016 Arne Cleveland, MD [facility]INTER[facility]  . IR GENERIC HISTORICAL  01/27/2016   IR US GUIDE VASC ACCESS RIGHT 01/27/2016 Arne Cleveland, MD [facility]  . LAPAROSCOPIC CHOLECYSTECTOMY  03/2011  . RENAL BIOPSY, PERCUTANEOUS Right 09/08/2014  . TONSILLECTOMY  ~ 1960    SOCIAL HISTORY: Social History   Social History  . Marital status: Divorced    Spouse name: N/A  . Number of children: N/A  . Years of education: N/A   Occupational History  . Not on file.   Social History Main Topics  . Smoking status: Former Smoker    Packs/day: 2.00    Years: 40.00    Types: Cigarettes    Quit date: 03/19/2011  . Smokeless tobacco: Never Used  . Alcohol use No  . Drug use: No     Comment: 09/08/2014 "I have used marijuana till the  1990's". Notes that he quit 15 yrs ago  . Sexual activity: Not Currently   Other Topics Concern  . Not on file   Social History Narrative  . 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 Lives with his daughter and her finance.  FAMILY HISTORY: Family History  Problem Relation Age of Onset  . Hypertension Mother   . Diabetes Mother   . Heart disease Mother   . Skin cancer Mother   . Hypertension Father   . Diabetes Father   . Diabetes  Sister     ALLERGIES:  is allergic to no known allergies.  MEDICATIONS:  Current Outpatient Prescriptions  Medication Sig Dispense Refill  . acetaminophen (TYLENOL) 325 MG tablet Take 325-650 mg by mouth every 6 (six) hours as needed (back pain).    [name] acyclovir (ZOVIRAX) 400 MG tablet TAKE 1 TABLET BY MOUTH DAILY. (DOSE ADJUSTED FOR RENAL FUNCTION) 60 tablet 3  . aspirin EC 81 MG tablet Take 1 tablet (81 mg total) by mouth daily. 30 tablet 11  . calcium acetate (PHOSLO) 667 MG capsule Take 1 capsule (667 mg total) by mouth 3 (three) times daily with meals. 90 capsule 0  . cetirizine (ZYRTEC) 10 MG tablet Take 10 mg by mouth daily.    . Cholecalciferol (VITAMIN D3) 3000 units TABS Take 3,000 Units by mouth daily.    . Cyanocobalamin (VITAMIN B-12) 1000 MCG/15ML LIQD Take 1 mL by mouth daily.    [name] Epoetin Alfa (PROCRIT IJ) Inject as directed every 14 (fourteen) days. Done at [facility] Last injection 01/19/16    . FLONASE 50 MCG/ACT nasal spray Place 1 spray into both nostrils daily as needed. (Patient taking differently: Place 1 spray into both nostrils daily as needed for allergies or rhinitis. )    . gabapentin (NEURONTIN) 250 MG/5ML solution Take 6 mLs (300 mg total) by mouth at bedtime. 470 mL 0  . ixazomib citrate (NINLARO) 3 MG capsule Take 1 capsule (3 mg total) by mouth every 14 (fourteen) days. Every other Thursday 6 capsule 2  . LORazepam (ATIVAN) 1 MG tablet Take 1 tablet (1 mg total) by mouth daily as needed for anxiety (nausea). 30 tablet 0   No current facility-administered medications for this visit.     REVIEW OF SYSTEMS:   All other systems were reviewed with the patient and are negative.   PHYSICAL EXAMINATION: ECOG PERFORMANCE STATUS: 2 - Symptomatic, <50% confined to bed  Vitals:   09/06/16 1003  BP: (!) 156/61  Pulse: 81  Resp: 20  Temp: 98.2 F (36.8 C)   Filed Weights   09/06/16 1003  Weight: 197 lb 12.8 oz (89.7 kg)    GENERAL:alert, Mild this  is due to sinus congestion and sore throat and cough. SKIN: Ecchymosis on upper extremities. EYES: nl EOM, PERL OROPHARYNX: No JVD No cervical adenopathy.  NECK: no JVD LYMPH:  no palpable lymphadenopathy in the cervical, axillary or inguinal LUNGS: clear to auscultation and percussion with normal breathing effort HEART: regular rate & rhythm and 2/6 SM aortic area ABDOMEN:abdomen soft, non-tender and normal bowel sounds Musculoskeletal: B/l trace pitting pedal edema No Tenderness. No redness.  PSYCH: alert & oriented x 3 with fluent speech NEURO: no focal motor/sensory deficits  LABORATORY DATA:   . CBC Latest Ref Rng & Units 09/06/2016 07/12/2016 05/15/2016  WBC 4.0 - 10.3 10e3/uL 5.1 4.7 4.7  Hemoglobin 13.0 - 17.1 g/dL 10.9(L) 10.8(L) 10.7(L)  Hematocrit 38.4 - 49.9 % 32.7(L) 32.8(L) 31.9(L)  Platelets 140 - 400 10e3/uL 71(L) 76(L) 89(L)   . CMP Latest Ref Rng & Units 09/06/2016 09/06/2016 07/12/2016  Glucose 70 - 140 mg/dl 133 - 121  BUN 7.0 - 26.0 mg/dL 33.7(H) - 33.6(H)  Creatinine 0.7 - 1.3 mg/dL 8.0(HH) - 7.9(HH)  Sodium 136 - 145 mEq/L 139 - 141  Potassium 3.5 - 5.1 mEq/L 4.4 - 5.0  Chloride 101 - 111 mmol/L - - -  CO2 22 - 29 mEq/L 27 - 28  Calcium 8.4 - 10.4 mg/dL 10.3 - 9.9  Total Protein 6.4 - 8.3 g/dL 7.8 7.2 8.0  Total Bilirubin 0.20 - 1.20 mg/dL 0.82 - 0.74  Alkaline Phos 40 - 150 U/L 86 - 91  AST 5 - 34 U/L 21 - 22  ALT 0 - 55 U/L 20 - 19   Transthoracic Echocardiography  Patient:    Jose Garrett, Jose Garrett MR #:       [id number] Study Date: 07/03/2016 Gender:     M Age:        66 Height:     177.8 cm Weight:     88.7 kg BSA:        2.11 m^2 Pt. Status: Room:   SONOGRAPHER  [place], Outpatient  ATTENDING    [place], [place], [place] [facility]  REFERRING    [place], [place]  cc:  ------------------------------------------------------------------- LV EF: 60% -    65%  ------------------------------------------------------------------- History:   PMH:  On oral chemotherapy for AL Amyloidosis. Restless legs. Renal disorder on HD. Hypertension. Hyperlipidemia. H/O blood transfusion. GERD. Enlarged heart. Dyspnea. AL amyloidosis for cardiac function screening.  ------------------------------------------------------------------- Study Conclusions  - Left ventricle: The cavity size was normal. Wall thickness was   increased in a pattern of moderate LVH. Systolic function was   normal. The estimated ejection fraction was in the range of 60%   to 65%. Wall motion was normal; there were no regional wall   motion abnormalities. Features are consistent with a pseudonormal   left ventricular filling pattern, with concomitant abnormal   relaxation and increased filling pressure (grade 2 diastolic   dysfunction). - Aortic valve: There was mild stenosis. There was mild   regurgitation. Valve area (VTI): 1.7 cm^2. Valve area (Vmax):   1.91 cm^2. Valve area (Vmean): 1.66 cm^2. - Mitral valve: Mildly calcified annulus. There was mild   regurgitation.   RADIOGRAPHIC STUDIES: I have personally reviewed the radiological images as listed and agreed with the findings in the report.   ASSESSMENT & PLAN:    Mr Trenton Garrett is a very pleasant 66 yo caucasian male with   1) AL Amyloidosis noted on renal biopsy with nephrotic range proteinuria on urinalysis and some lambda free light chains on serum and urinary IFE . On diagnosis:  SPEP with small amount of M-spike 0.3  Echo showed no systolic dysfunction but grade 2 diastolic dysfunction. [facility] cardiac staging 1 as per troponin and BNP criteria. Bone marrow with no overt evidence of multiple myeloma. PET/CT scan showed no evidence of bony lesions or lymphadenopathy . Urine showed about 15 g of protein in 24 hours   Patient is status post CyBorD x 6 cycles He was then on maintenance Velcade and had  progression of his AL Amyloidosis. He was then switched to Ixazomib and Dexamethasone and has completed 6 cycles without significant toxicities. Echo repeated on 12/06/2015 shows  no changes in heart function compared to previous echo from March 2017  ECHO rpt 4/28: stable EF.  Currently on maintenance Ixazomib q2weeks and tolerating it without any significant toxicities.  2) Thrombocytopenia mild likely from the Ixazomib. Patient is already on renally adjusted dose of the medication. PLT 71k - will continue to monitor  Plan -- No overt limiting toxicity from Ixazomib. Has grade 1 neuropathy from previous Velcade which has remained stable.  --we will continue Ixazomib maintenance day 1 and 15 every 28 days --Would need to hold up his Ixazomib if platelet counts drop below 50,000 and would need to reduce dose an additional dose level down to 2.'3mg'$  --myeloma panel from today neg for M spike and serum free light chain results from today nl ratio. --continue on low-dose aspirin for VTE prophylaxis given his high risk of venous thromboembolism in the setting of nephrotic syndrome.  -continue Acyclovir for shingles prophylaxis. -Echocardiogram repeat would be next due in September/October 2018 .  2) multifactorial anemia - likely multifactorial -from AL amyloidosis, anemia from CKD. Hemoglobin is improved at 11.8 stable and is predominantly due to his significant chronic kidney disease B12 WNL -Previously deficient  Plan -Continue ESA's and iron replacement as per nephrology. -continue B12 replacement SL   3) CKD stage V-related to hypertension and AL Amyloidosis. Has been started on hemodialysis through a tunnel catheter since his last clinic visit. Had AV fistula placement on 12/27/2015  Plan -Continue hemodialysis as per nephrologist.  -patient is hoping to transition his HD to AVF and have his catheter removed If possible.  4) Feet tingling and numbness ? Related to AL Amyloidosis vs  Velcade vs B12 deficiency stable.  No evidence of change in symptoms. Plan -continue Ixazomib maintenance every other week dosing. -Dose to be taken after hemodialysis session on those days.  -Will need to monitor for neuropathy with his Ixazomib. Given option to hold Ninlaro and monitor . He chooses to continue current maintenance dosing. -continue neurontin '600mg'$  po HS.  -continue SL B12   B12 levels WNL with replacement on 07/12/16  RTC with Dr [name] in 8 weeks with labs  All questions were answered. The patient knows to call the clinic with any problems, questions or concerns. I spent 20 minutes counseling the patient face to face. The total time spent in the appointment was 25 minutes and more than 50% was on counseling.   Sullivan Lone MD [place] Hematology/Oncology Physician [facility]  (Office):       [phone] (Work cell):  [phone] (Fax):           [phone]

## 2016-09-14 DIAGNOSIS — N186 End stage renal disease: Secondary | ICD-10-CM | POA: Diagnosis not present

## 2016-09-14 DIAGNOSIS — D631 Anemia in chronic kidney disease: Secondary | ICD-10-CM | POA: Diagnosis not present

## 2016-09-14 DIAGNOSIS — N2581 Secondary hyperparathyroidism of renal origin: Secondary | ICD-10-CM | POA: Diagnosis not present

## 2016-09-14 DIAGNOSIS — D509 Iron deficiency anemia, unspecified: Secondary | ICD-10-CM | POA: Diagnosis not present

## 2016-09-17 DIAGNOSIS — D509 Iron deficiency anemia, unspecified: Secondary | ICD-10-CM | POA: Diagnosis not present

## 2016-09-17 DIAGNOSIS — N186 End stage renal disease: Secondary | ICD-10-CM | POA: Diagnosis not present

## 2016-09-17 DIAGNOSIS — D631 Anemia in chronic kidney disease: Secondary | ICD-10-CM | POA: Diagnosis not present

## 2016-09-17 DIAGNOSIS — N2581 Secondary hyperparathyroidism of renal origin: Secondary | ICD-10-CM | POA: Diagnosis not present

## 2016-09-19 DIAGNOSIS — D509 Iron deficiency anemia, unspecified: Secondary | ICD-10-CM | POA: Diagnosis not present

## 2016-09-19 DIAGNOSIS — N2581 Secondary hyperparathyroidism of renal origin: Secondary | ICD-10-CM | POA: Diagnosis not present

## 2016-09-19 DIAGNOSIS — D631 Anemia in chronic kidney disease: Secondary | ICD-10-CM | POA: Diagnosis not present

## 2016-09-19 DIAGNOSIS — N186 End stage renal disease: Secondary | ICD-10-CM | POA: Diagnosis not present

## 2016-09-21 DIAGNOSIS — D509 Iron deficiency anemia, unspecified: Secondary | ICD-10-CM | POA: Diagnosis not present

## 2016-09-21 DIAGNOSIS — D631 Anemia in chronic kidney disease: Secondary | ICD-10-CM | POA: Diagnosis not present

## 2016-09-21 DIAGNOSIS — N186 End stage renal disease: Secondary | ICD-10-CM | POA: Diagnosis not present

## 2016-09-21 DIAGNOSIS — N2581 Secondary hyperparathyroidism of renal origin: Secondary | ICD-10-CM | POA: Diagnosis not present

## 2016-09-24 DIAGNOSIS — N186 End stage renal disease: Secondary | ICD-10-CM | POA: Diagnosis not present

## 2016-09-24 DIAGNOSIS — D631 Anemia in chronic kidney disease: Secondary | ICD-10-CM | POA: Diagnosis not present

## 2016-09-24 DIAGNOSIS — D509 Iron deficiency anemia, unspecified: Secondary | ICD-10-CM | POA: Diagnosis not present

## 2016-09-24 DIAGNOSIS — N2581 Secondary hyperparathyroidism of renal origin: Secondary | ICD-10-CM | POA: Diagnosis not present

## 2016-09-26 DIAGNOSIS — N186 End stage renal disease: Secondary | ICD-10-CM | POA: Diagnosis not present

## 2016-09-26 DIAGNOSIS — D509 Iron deficiency anemia, unspecified: Secondary | ICD-10-CM | POA: Diagnosis not present

## 2016-09-26 DIAGNOSIS — D631 Anemia in chronic kidney disease: Secondary | ICD-10-CM | POA: Diagnosis not present

## 2016-09-26 DIAGNOSIS — N2581 Secondary hyperparathyroidism of renal origin: Secondary | ICD-10-CM | POA: Diagnosis not present

## 2016-09-28 DIAGNOSIS — N2581 Secondary hyperparathyroidism of renal origin: Secondary | ICD-10-CM | POA: Diagnosis not present

## 2016-09-28 DIAGNOSIS — D509 Iron deficiency anemia, unspecified: Secondary | ICD-10-CM | POA: Diagnosis not present

## 2016-09-28 DIAGNOSIS — D631 Anemia in chronic kidney disease: Secondary | ICD-10-CM | POA: Diagnosis not present

## 2016-09-28 DIAGNOSIS — N186 End stage renal disease: Secondary | ICD-10-CM | POA: Diagnosis not present

## 2016-10-01 DIAGNOSIS — N186 End stage renal disease: Secondary | ICD-10-CM | POA: Diagnosis not present

## 2016-10-01 DIAGNOSIS — D509 Iron deficiency anemia, unspecified: Secondary | ICD-10-CM | POA: Diagnosis not present

## 2016-10-01 DIAGNOSIS — D631 Anemia in chronic kidney disease: Secondary | ICD-10-CM | POA: Diagnosis not present

## 2016-10-01 DIAGNOSIS — N2581 Secondary hyperparathyroidism of renal origin: Secondary | ICD-10-CM | POA: Diagnosis not present

## 2016-10-03 DIAGNOSIS — N2581 Secondary hyperparathyroidism of renal origin: Secondary | ICD-10-CM | POA: Diagnosis not present

## 2016-10-03 DIAGNOSIS — D631 Anemia in chronic kidney disease: Secondary | ICD-10-CM | POA: Diagnosis not present

## 2016-10-03 DIAGNOSIS — N186 End stage renal disease: Secondary | ICD-10-CM | POA: Diagnosis not present

## 2016-10-03 DIAGNOSIS — D509 Iron deficiency anemia, unspecified: Secondary | ICD-10-CM | POA: Diagnosis not present

## 2016-10-05 DIAGNOSIS — N2581 Secondary hyperparathyroidism of renal origin: Secondary | ICD-10-CM | POA: Diagnosis not present

## 2016-10-05 DIAGNOSIS — D509 Iron deficiency anemia, unspecified: Secondary | ICD-10-CM | POA: Diagnosis not present

## 2016-10-05 DIAGNOSIS — N186 End stage renal disease: Secondary | ICD-10-CM | POA: Diagnosis not present

## 2016-10-05 DIAGNOSIS — D631 Anemia in chronic kidney disease: Secondary | ICD-10-CM | POA: Diagnosis not present

## 2016-10-08 DIAGNOSIS — N2581 Secondary hyperparathyroidism of renal origin: Secondary | ICD-10-CM | POA: Diagnosis not present

## 2016-10-08 DIAGNOSIS — D631 Anemia in chronic kidney disease: Secondary | ICD-10-CM | POA: Diagnosis not present

## 2016-10-08 DIAGNOSIS — D509 Iron deficiency anemia, unspecified: Secondary | ICD-10-CM | POA: Diagnosis not present

## 2016-10-08 DIAGNOSIS — N186 End stage renal disease: Secondary | ICD-10-CM | POA: Diagnosis not present

## 2016-10-09 DIAGNOSIS — N186 End stage renal disease: Secondary | ICD-10-CM | POA: Diagnosis not present

## 2016-10-09 DIAGNOSIS — I129 Hypertensive chronic kidney disease with stage 1 through stage 4 chronic kidney disease, or unspecified chronic kidney disease: Secondary | ICD-10-CM | POA: Diagnosis not present

## 2016-10-09 DIAGNOSIS — Z992 Dependence on renal dialysis: Secondary | ICD-10-CM | POA: Diagnosis not present

## 2016-10-10 DIAGNOSIS — N186 End stage renal disease: Secondary | ICD-10-CM | POA: Diagnosis not present

## 2016-10-10 DIAGNOSIS — N2581 Secondary hyperparathyroidism of renal origin: Secondary | ICD-10-CM | POA: Diagnosis not present

## 2016-10-10 DIAGNOSIS — D509 Iron deficiency anemia, unspecified: Secondary | ICD-10-CM | POA: Diagnosis not present

## 2016-10-10 DIAGNOSIS — D631 Anemia in chronic kidney disease: Secondary | ICD-10-CM | POA: Diagnosis not present

## 2016-10-11 ENCOUNTER — Telehealth: Payer: Self-pay | Admitting: *Deleted

## 2016-10-11 MED ORDER — GABAPENTIN 250 MG/5ML PO SOLN: 300.0000 mg | Freq: Every day | ORAL | 0 refills | Status: DC

## 2016-10-11 NOTE — Telephone Encounter (Signed)
"  I need a refill on the Gabapentin solution sent to [facility].  The pharmacist told me to call the office."  Refill sent eRx.

## 2016-10-12 DIAGNOSIS — D631 Anemia in chronic kidney disease: Secondary | ICD-10-CM | POA: Diagnosis not present

## 2016-10-12 DIAGNOSIS — N186 End stage renal disease: Secondary | ICD-10-CM | POA: Diagnosis not present

## 2016-10-12 DIAGNOSIS — D509 Iron deficiency anemia, unspecified: Secondary | ICD-10-CM | POA: Diagnosis not present

## 2016-10-12 DIAGNOSIS — N2581 Secondary hyperparathyroidism of renal origin: Secondary | ICD-10-CM | POA: Diagnosis not present

## 2016-10-15 DIAGNOSIS — N2581 Secondary hyperparathyroidism of renal origin: Secondary | ICD-10-CM | POA: Diagnosis not present

## 2016-10-15 DIAGNOSIS — N186 End stage renal disease: Secondary | ICD-10-CM | POA: Diagnosis not present

## 2016-10-15 DIAGNOSIS — D631 Anemia in chronic kidney disease: Secondary | ICD-10-CM | POA: Diagnosis not present

## 2016-10-15 DIAGNOSIS — D509 Iron deficiency anemia, unspecified: Secondary | ICD-10-CM | POA: Diagnosis not present

## 2016-10-16 ENCOUNTER — Telehealth: Payer: Self-pay | Admitting: Pharmacy Technician

## 2016-10-16 NOTE — Telephone Encounter (Signed)
Oral Oncology Patient Advocate Encounter  Met patient in [place] to complete application for Ninlaro 1Point in an effort to keep the patient's out of pocket expense for Ninlaro at $0.    Application completed and faxed to [phone].   Ninlaro 1Point patient assistance phone number for follow up is [phone].   This encounter will be updated until final determination.   Fabio Asa. Melynda Keller, [place] Patient [facility] [phone] 10/16/2016 10:45 AM

## 2016-10-17 DIAGNOSIS — D631 Anemia in chronic kidney disease: Secondary | ICD-10-CM | POA: Diagnosis not present

## 2016-10-17 DIAGNOSIS — N186 End stage renal disease: Secondary | ICD-10-CM | POA: Diagnosis not present

## 2016-10-17 DIAGNOSIS — D509 Iron deficiency anemia, unspecified: Secondary | ICD-10-CM | POA: Diagnosis not present

## 2016-10-17 DIAGNOSIS — N2581 Secondary hyperparathyroidism of renal origin: Secondary | ICD-10-CM | POA: Diagnosis not present

## 2016-10-18 ENCOUNTER — Telehealth: Payer: Self-pay | Admitting: Hematology

## 2016-10-18 NOTE — Telephone Encounter (Signed)
Oral Oncology Patient Advocate Encounter  Received notification from [facility] 1 point patient asst. Program that prior authorization for Jose Garrett is required.  PA submitted on CoverMyMeds Key [id number] Status is pending  Oral Oncology Clinic will continue to follow.

## 2016-10-19 DIAGNOSIS — N186 End stage renal disease: Secondary | ICD-10-CM | POA: Diagnosis not present

## 2016-10-19 DIAGNOSIS — N2581 Secondary hyperparathyroidism of renal origin: Secondary | ICD-10-CM | POA: Diagnosis not present

## 2016-10-19 DIAGNOSIS — D509 Iron deficiency anemia, unspecified: Secondary | ICD-10-CM | POA: Diagnosis not present

## 2016-10-19 DIAGNOSIS — D631 Anemia in chronic kidney disease: Secondary | ICD-10-CM | POA: Diagnosis not present

## 2016-10-22 DIAGNOSIS — D509 Iron deficiency anemia, unspecified: Secondary | ICD-10-CM | POA: Diagnosis not present

## 2016-10-22 DIAGNOSIS — N2581 Secondary hyperparathyroidism of renal origin: Secondary | ICD-10-CM | POA: Diagnosis not present

## 2016-10-22 DIAGNOSIS — N186 End stage renal disease: Secondary | ICD-10-CM | POA: Diagnosis not present

## 2016-10-22 DIAGNOSIS — D631 Anemia in chronic kidney disease: Secondary | ICD-10-CM | POA: Diagnosis not present

## 2016-10-24 DIAGNOSIS — D631 Anemia in chronic kidney disease: Secondary | ICD-10-CM | POA: Diagnosis not present

## 2016-10-24 DIAGNOSIS — D509 Iron deficiency anemia, unspecified: Secondary | ICD-10-CM | POA: Diagnosis not present

## 2016-10-24 DIAGNOSIS — N186 End stage renal disease: Secondary | ICD-10-CM | POA: Diagnosis not present

## 2016-10-24 DIAGNOSIS — N2581 Secondary hyperparathyroidism of renal origin: Secondary | ICD-10-CM | POA: Diagnosis not present

## 2016-10-25 NOTE — Telephone Encounter (Signed)
Oral Oncology Patient Advocate Encounter  Received notification from [employer] that the request for prior authorization for Ninoaro has been denied due to the [facility] not being used in combination with dexamethasone.    This determination is currently being appealed.  The appeal packet was faxed to [phone].   This encounter will continue to be updated until final determination.    Fabio Asa. Melynda Keller, [place] Patient [place] [phone] 10/25/2016 10:01 AM

## 2016-10-26 DIAGNOSIS — N2581 Secondary hyperparathyroidism of renal origin: Secondary | ICD-10-CM | POA: Diagnosis not present

## 2016-10-26 DIAGNOSIS — D631 Anemia in chronic kidney disease: Secondary | ICD-10-CM | POA: Diagnosis not present

## 2016-10-26 DIAGNOSIS — D509 Iron deficiency anemia, unspecified: Secondary | ICD-10-CM | POA: Diagnosis not present

## 2016-10-26 DIAGNOSIS — N186 End stage renal disease: Secondary | ICD-10-CM | POA: Diagnosis not present

## 2016-10-29 DIAGNOSIS — N2581 Secondary hyperparathyroidism of renal origin: Secondary | ICD-10-CM | POA: Diagnosis not present

## 2016-10-29 DIAGNOSIS — D509 Iron deficiency anemia, unspecified: Secondary | ICD-10-CM | POA: Diagnosis not present

## 2016-10-29 DIAGNOSIS — N186 End stage renal disease: Secondary | ICD-10-CM | POA: Diagnosis not present

## 2016-10-29 DIAGNOSIS — D631 Anemia in chronic kidney disease: Secondary | ICD-10-CM | POA: Diagnosis not present

## 2016-10-31 DIAGNOSIS — N186 End stage renal disease: Secondary | ICD-10-CM | POA: Diagnosis not present

## 2016-10-31 DIAGNOSIS — D631 Anemia in chronic kidney disease: Secondary | ICD-10-CM | POA: Diagnosis not present

## 2016-10-31 DIAGNOSIS — D509 Iron deficiency anemia, unspecified: Secondary | ICD-10-CM | POA: Diagnosis not present

## 2016-10-31 DIAGNOSIS — N2581 Secondary hyperparathyroidism of renal origin: Secondary | ICD-10-CM | POA: Diagnosis not present

## 2016-11-01 NOTE — Telephone Encounter (Signed)
Oral Oncology Patient Advocate Encounter  Prior Authorization denial for Jose Garrett has been overturned and the medication has been approved.    PA# [id number] Effective dates: 07-28-2016 through 10-26-2017  Oral Oncology Clinic will continue to follow.   Fabio Asa. Melynda Keller, [name] Patient [place] [phone] 11/01/2016 12:33 PM

## 2016-11-01 NOTE — Telephone Encounter (Signed)
Oral Oncology Patient Advocate Encounter  A benefits investigation, as part of the [place] application process, revealed that [name] had been  approved for the low income subsidy through the [employer].  This has reduced his copayment for the Ninlaro to $8.35.    Approval for low income subsidy makes him ineligible for re-enrollment into the free drug program.    I have discussed this with [name] and he advised that the $8.35 copayment is affordable for him.    He still has a supply of medication at home.  He will contact the office when he takes his last capsule.  We will then arrange for a new RX to be sent to the [facility] for dispensing.    Jose Garrett, [place] Patient [place] [phone] 11/01/2016 12:23 PM

## 2016-11-02 DIAGNOSIS — N2581 Secondary hyperparathyroidism of renal origin: Secondary | ICD-10-CM | POA: Diagnosis not present

## 2016-11-02 DIAGNOSIS — D631 Anemia in chronic kidney disease: Secondary | ICD-10-CM | POA: Diagnosis not present

## 2016-11-02 DIAGNOSIS — N186 End stage renal disease: Secondary | ICD-10-CM | POA: Diagnosis not present

## 2016-11-02 DIAGNOSIS — D509 Iron deficiency anemia, unspecified: Secondary | ICD-10-CM | POA: Diagnosis not present

## 2016-11-05 DIAGNOSIS — D631 Anemia in chronic kidney disease: Secondary | ICD-10-CM | POA: Diagnosis not present

## 2016-11-05 DIAGNOSIS — N2581 Secondary hyperparathyroidism of renal origin: Secondary | ICD-10-CM | POA: Diagnosis not present

## 2016-11-05 DIAGNOSIS — N186 End stage renal disease: Secondary | ICD-10-CM | POA: Diagnosis not present

## 2016-11-05 DIAGNOSIS — D509 Iron deficiency anemia, unspecified: Secondary | ICD-10-CM | POA: Diagnosis not present

## 2016-11-06 ENCOUNTER — Ambulatory Visit (HOSPITAL_BASED_OUTPATIENT_CLINIC_OR_DEPARTMENT_OTHER): Payer: Medicare Other | Admitting: Hematology

## 2016-11-06 ENCOUNTER — Telehealth: Payer: Self-pay | Admitting: Hematology

## 2016-11-06 ENCOUNTER — Other Ambulatory Visit (HOSPITAL_BASED_OUTPATIENT_CLINIC_OR_DEPARTMENT_OTHER): Payer: Medicare Other

## 2016-11-06 ENCOUNTER — Encounter: Payer: Self-pay | Admitting: Hematology

## 2016-11-06 VITALS — BP 150/64 | HR 85 | Temp 98.1°F | Resp 19 | Ht 70.0 in | Wt 201.0 lb

## 2016-11-06 DIAGNOSIS — I129 Hypertensive chronic kidney disease with stage 1 through stage 4 chronic kidney disease, or unspecified chronic kidney disease: Secondary | ICD-10-CM | POA: Diagnosis not present

## 2016-11-06 DIAGNOSIS — N184 Chronic kidney disease, stage 4 (severe): Secondary | ICD-10-CM

## 2016-11-06 DIAGNOSIS — N183 Chronic kidney disease, stage 3 (moderate): Secondary | ICD-10-CM | POA: Diagnosis not present

## 2016-11-06 DIAGNOSIS — D631 Anemia in chronic kidney disease: Secondary | ICD-10-CM

## 2016-11-06 DIAGNOSIS — Z992 Dependence on renal dialysis: Secondary | ICD-10-CM

## 2016-11-06 DIAGNOSIS — E8581 Light chain (AL) amyloidosis: Secondary | ICD-10-CM

## 2016-11-06 DIAGNOSIS — E538 Deficiency of other specified B group vitamins: Secondary | ICD-10-CM

## 2016-11-06 DIAGNOSIS — D696 Thrombocytopenia, unspecified: Secondary | ICD-10-CM

## 2016-11-06 DIAGNOSIS — G620 Drug-induced polyneuropathy: Secondary | ICD-10-CM | POA: Diagnosis not present

## 2016-11-06 DIAGNOSIS — G629 Polyneuropathy, unspecified: Secondary | ICD-10-CM

## 2016-11-06 LAB — COMPREHENSIVE METABOLIC PANEL
ALBUMIN: 3.9 g/dL (ref 3.5–5.0)
ALK PHOS: 64 U/L (ref 40–150)
ALT: 17 U/L (ref 0–55)
ANION GAP: 11 meq/L (ref 3–11)
AST: 21 U/L (ref 5–34)
BUN: 28.9 mg/dL — AB (ref 7.0–26.0)
CALCIUM: 10 mg/dL (ref 8.4–10.4)
CHLORIDE: 99 meq/L (ref 98–109)
CO2: 31 mEq/L — ABNORMAL HIGH (ref 22–29)
Creatinine: 8.1 mg/dL (ref 0.7–1.3)
EGFR: 6 mL/min/{1.73_m2} — ABNORMAL LOW (ref >90)
Glucose: 120 mg/dl (ref 70–140)
Potassium: 5.3 mEq/L — ABNORMAL HIGH (ref 3.5–5.1)
Sodium: 141 mEq/L (ref 136–145)
TOTAL PROTEIN: 7.4 g/dL (ref 6.4–8.3)
Total Bilirubin: 0.8 mg/dL (ref 0.20–1.20)

## 2016-11-06 LAB — CBC & DIFF AND RETIC
BASO%: 0.2 % (ref 0.0–2.0)
BASOS ABS: 0 10*3/uL (ref 0.0–0.1)
EOS ABS: 0.1 10*3/uL (ref 0.0–0.5)
EOS%: 2.9 % (ref 0.0–7.0)
HEMATOCRIT: 33 % — AB (ref 38.4–49.9)
HEMOGLOBIN: 10.8 g/dL — AB (ref 13.0–17.1)
Immature Retic Fract: 2.9 % — ABNORMAL LOW (ref 3.00–10.60)
LYMPH#: 0.8 10*3/uL — AB (ref 0.9–3.3)
LYMPH%: 19.8 % (ref 14.0–49.0)
MCH: 34.5 pg — ABNORMAL HIGH (ref 27.2–33.4)
MCHC: 32.7 g/dL (ref 32.0–36.0)
MCV: 105.4 fL — ABNORMAL HIGH (ref 79.3–98.0)
MONO#: 0.4 10*3/uL (ref 0.1–0.9)
MONO%: 9.8 % (ref 0.0–14.0)
NEUT#: 2.8 10*3/uL (ref 1.5–6.5)
NEUT%: 67.3 % (ref 39.0–75.0)
PLATELETS: 75 10*3/uL — AB (ref 140–400)
RBC: 3.13 10*6/uL — ABNORMAL LOW (ref 4.20–5.82)
RDW: 15.7 % — ABNORMAL HIGH (ref 11.0–14.6)
RETIC CT ABS: 58.84 10*3/uL (ref 34.80–93.90)
Retic %: 1.88 % — ABNORMAL HIGH (ref 0.80–1.80)
WBC: 4.1 10*3/uL (ref 4.0–10.3)

## 2016-11-06 NOTE — Telephone Encounter (Signed)
Scheduled patients appts and he did not want an avs or calendar.   Got his ECHO scheduled and gave him a call that informed him of the time and date.

## 2016-11-07 DIAGNOSIS — D509 Iron deficiency anemia, unspecified: Secondary | ICD-10-CM | POA: Diagnosis not present

## 2016-11-07 DIAGNOSIS — D631 Anemia in chronic kidney disease: Secondary | ICD-10-CM | POA: Diagnosis not present

## 2016-11-07 DIAGNOSIS — N2581 Secondary hyperparathyroidism of renal origin: Secondary | ICD-10-CM | POA: Diagnosis not present

## 2016-11-07 DIAGNOSIS — N186 End stage renal disease: Secondary | ICD-10-CM | POA: Diagnosis not present

## 2016-11-07 LAB — KAPPA/LAMBDA LIGHT CHAINS
IG KAPPA FREE LIGHT CHAIN: 109.5 mg/L — AB (ref 3.3–19.4)
IG LAMBDA FREE LIGHT CHAIN: 83.3 mg/L — AB (ref 5.7–26.3)
Kappa/Lambda FluidC Ratio: 1.31 (ref 0.26–1.65)

## 2016-11-08 LAB — MULTIPLE MYELOMA PANEL, SERUM
ALBUMIN SERPL ELPH-MCNC: 3.9 g/dL (ref 2.9–4.4)
ALPHA 1: 0.2 g/dL (ref 0.0–0.4)
Albumin/Glob SerPl: 1.4 (ref 0.7–1.7)
Alpha2 Glob SerPl Elph-Mcnc: 0.9 g/dL (ref 0.4–1.0)
B-Globulin SerPl Elph-Mcnc: 0.9 g/dL (ref 0.7–1.3)
Gamma Glob SerPl Elph-Mcnc: 1 g/dL (ref 0.4–1.8)
Globulin, Total: 3 g/dL (ref 2.2–3.9)
IGA/IMMUNOGLOBULIN A, SERUM: 97 mg/dL (ref 61–437)
IGM (IMMUNOGLOBIN M), SRM: 136 mg/dL (ref 20–172)
IgG, Qn, Serum: 937 mg/dL (ref 700–1600)
Total Protein: 6.9 g/dL (ref 6.0–8.5)

## 2016-11-09 DIAGNOSIS — D631 Anemia in chronic kidney disease: Secondary | ICD-10-CM | POA: Diagnosis not present

## 2016-11-09 DIAGNOSIS — I129 Hypertensive chronic kidney disease with stage 1 through stage 4 chronic kidney disease, or unspecified chronic kidney disease: Secondary | ICD-10-CM | POA: Diagnosis not present

## 2016-11-09 DIAGNOSIS — N186 End stage renal disease: Secondary | ICD-10-CM | POA: Diagnosis not present

## 2016-11-09 DIAGNOSIS — Z992 Dependence on renal dialysis: Secondary | ICD-10-CM | POA: Diagnosis not present

## 2016-11-09 DIAGNOSIS — D509 Iron deficiency anemia, unspecified: Secondary | ICD-10-CM | POA: Diagnosis not present

## 2016-11-09 DIAGNOSIS — N2581 Secondary hyperparathyroidism of renal origin: Secondary | ICD-10-CM | POA: Diagnosis not present

## 2016-11-12 DIAGNOSIS — N186 End stage renal disease: Secondary | ICD-10-CM | POA: Diagnosis not present

## 2016-11-12 DIAGNOSIS — Z23 Encounter for immunization: Secondary | ICD-10-CM | POA: Diagnosis not present

## 2016-11-12 DIAGNOSIS — D631 Anemia in chronic kidney disease: Secondary | ICD-10-CM | POA: Diagnosis not present

## 2016-11-12 DIAGNOSIS — N2581 Secondary hyperparathyroidism of renal origin: Secondary | ICD-10-CM | POA: Diagnosis not present

## 2016-11-12 DIAGNOSIS — D509 Iron deficiency anemia, unspecified: Secondary | ICD-10-CM | POA: Diagnosis not present

## 2016-11-13 NOTE — Progress Notes (Signed)
.    Hematology oncology clinic note  Date of service  .11/06/2016  Patient Care Team: [name], MD as PCP - General (Family Medicine) [name], MD as PCP - Hematology/Oncology (Hematology and Oncology) [name], MD as Consulting Physician (Nephrology)  CHIEF COMPLAINTS: followup for AL amyloidosis.  HISTORY OF PRESENTING ILLNESS: Please see my initial consultation for details on presentation.  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 AL amyloidosis noted on kidney biopsy diagnosed 09/28/2014.    Current treatment Ixazomib maintenance q2weeks   previous treatment  CyBorD x 6 cycles Evaluated for and declined Auto HSCT at [facility]. - maintenance Velcade  Ixazomib + Dexamethasone s/p 6 cycles  INTERVAL HISTORY  [name] is here for his scheduled 2 month follow-up for AL amyloidosis.  He notes no acute new concerns. Notes unchanged grade 1-2 neuropathy with symptoms controlled with Neurontin. Has been getting as needed IV iron and Aranesp by his nephrologist to address his anemia of chronic disease. Notes he is tolerating his hemodialysis better. He notes that he is is moving out of his daughters house into his own apartment. SPEP today showed no M spike and normal serum free kappa and lambda light chain ratio showing he continues to be in hematologic remission.  Last echocardiogram was on 07/07/2016 and showed normal ejection fraction with no significant changes in heart function and he shall be due for rpt ECHO In 2 months . No other acute new concerns  MEDICAL HISTORY:  Past Medical History:  Diagnosis Date  . Amyloidosis ([place])   . Anemia Dx 2016  . Chronic diastolic CHF (congestive heart failure), NYHA class 1 ([place])    /hospital problem list 09/08/2014  . Chronic renal disease, stage III    /hospital problem list 09/08/2014  . Diarrhea   . Dyspnea    with exertion  .  Enlarged heart 2015  . Family history of adverse reaction to anesthesia    "my daughter can't take certain anesthesia agents" (09/08/2014)  . GERD (gastroesophageal reflux disease)   . History of blood transfusion 09/08/2014   "got hematoma after renal biopsy & HgB dropped"  . Hyperlipidemia Dx 2012  . Hypertension Dx 2012  . Renal disorder    "there is a spot on my kidney" (09/08/2014)  . Restless legs     SURGICAL HISTORY: Past Surgical History:  Procedure Laterality Date  . AV FISTULA PLACEMENT Right 12/27/2015   Procedure: Right Arm ARTERIOVENOUS (AV) FISTULA CREATION;  Surgeon: Angelia Mould, MD;  Location: [place];  Service: Vascular;  Laterality: Right;  . COLONOSCOPY    . IR GENERIC HISTORICAL  01/27/2016   IR FLUORO GUIDE CV LINE RIGHT 01/27/2016 Arne Cleveland, MD [facility]INTER[facility]  . IR GENERIC HISTORICAL  01/27/2016   IR US GUIDE VASC ACCESS RIGHT 01/27/2016 Arne Cleveland, MD [facility]INTER[facility]  . LAPAROSCOPIC CHOLECYSTECTOMY  03/2011  . RENAL BIOPSY, PERCUTANEOUS Right 09/08/2014  . TONSILLECTOMY  ~ 1960    SOCIAL HISTORY: Social History   Social History  . Marital status: Divorced    Spouse name: N/A  . Number of children: N/A  . Years of education: N/A   Occupational History  . Not on file.   Social History Main Topics  . Smoking status: Former Smoker    Packs/day: 2.00    Years: 40.00    Types: Cigarettes    Quit date: 03/19/2011  . Smokeless tobacco: Never Used  . Alcohol use  No  . Drug use: No     Comment: 09/08/2014 "I have used marijuana till the 1990's". Notes that he quit 15 yrs ago  . Sexual activity: Not Currently   Other Topics Concern  . Not on file   Social History Narrative  . 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 Lives with his daughter and her finance.  FAMILY HISTORY: Family History  Problem Relation Age of Onset  . Hypertension Mother   . Diabetes Mother   . Heart disease  Mother   . Skin cancer Mother   . Hypertension Father   . Diabetes Father   . Diabetes Sister     ALLERGIES:  is allergic to no known allergies.  MEDICATIONS:  Current Outpatient Prescriptions  Medication Sig Dispense Refill  . amitriptyline (ELAVIL) 50 MG tablet Take 50 mg by mouth at bedtime.    . B Complex-C-Folic Acid (DIALYVITE 884) 0.8 MG TABS Take 0.8 mg by mouth daily.    . cyanocobalamin 2000 MCG tablet Take 2,500 mcg by mouth daily.    [name] lanthanum (FOSRENOL) 1000 MG chewable tablet Chew 1,000 mg by mouth 2 (two) times daily with a meal.    . multivitamin (RENA-VIT) TABS tablet Take 1 tablet by mouth daily.    [name] acetaminophen (TYLENOL) 325 MG tablet Take 325-650 mg by mouth every 6 (six) hours as needed (back pain).    [name] acyclovir (ZOVIRAX) 400 MG tablet TAKE 1 TABLET BY MOUTH DAILY. (DOSE ADJUSTED FOR RENAL FUNCTION) 60 tablet 3  . aspirin EC 81 MG tablet Take 1 tablet (81 mg total) by mouth daily. 30 tablet 11  . calcium acetate (PHOSLO) 667 MG capsule Take 1 capsule (667 mg total) by mouth 3 (three) times daily with meals. 90 capsule 0  . cetirizine (ZYRTEC) 10 MG tablet Take 10 mg by mouth daily.    . Cholecalciferol (VITAMIN D3) 3000 units TABS Take 3,000 Units by mouth daily.    [name] Epoetin Alfa (PROCRIT IJ) Inject as directed every 14 (fourteen) days. Done at [facility] Last injection 01/19/16    . FLONASE 50 MCG/ACT nasal spray Place 1 spray into both nostrils daily as needed. (Patient taking differently: Place 1 spray into both nostrils daily as needed for allergies or rhinitis. )    . gabapentin (NEURONTIN) 250 MG/5ML solution Take 6 mLs (300 mg total) by mouth at bedtime. 470 mL 0  . ixazomib citrate (NINLARO) 3 MG capsule Take 1 capsule (3 mg total) by mouth every 14 (fourteen) days. Every other Thursday 6 capsule 2  . LORazepam (ATIVAN) 1 MG tablet Take 1 tablet (1 mg total) by mouth daily as needed for anxiety (nausea). 30 tablet 0   No current  facility-administered medications for this visit.     REVIEW OF SYSTEMS:   All other systems were reviewed with the patient and are negative.   PHYSICAL EXAMINATION: ECOG PERFORMANCE STATUS: 2 - Symptomatic, <50% confined to bed  Vitals:   11/06/16 1013  BP: (!) 150/64  Pulse: 85  Resp: 19  Temp: 98.1 F (36.7 C)  SpO2: 98%   Filed Weights   11/06/16 1013  Weight: 201 lb (91.2 kg)    GENERAL:alert, Mild this is due to sinus congestion and sore throat and cough. SKIN: Ecchymosis on upper extremities. EYES: nl EOM, PERL OROPHARYNX: No JVD No cervical adenopathy.  NECK: no JVD LYMPH:  no palpable lymphadenopathy in the cervical, axillary  or inguinal LUNGS: clear to auscultation and percussion with normal breathing effort HEART: regular rate & rhythm and 2/6 SM aortic area ABDOMEN:abdomen soft, non-tender and normal bowel sounds Musculoskeletal: B/l trace pitting pedal edema No Tenderness. No redness.  PSYCH: alert & oriented x 3 with fluent speech NEURO: no focal motor/sensory deficits  LABORATORY DATA:   . CBC Latest Ref Rng & Units 11/06/2016 09/06/2016 07/12/2016  WBC 4.0 - 10.3 10e3/uL 4.1 5.1 4.7  Hemoglobin 13.0 - 17.1 g/dL 10.8(L) 10.9(L) 10.8(L)  Hematocrit 38.4 - 49.9 % 33.0(L) 32.7(L) 32.8(L)  Platelets 140 - 400 10e3/uL 75(L) 71(L) 76(L)   . CMP Latest Ref Rng & Units 11/06/2016 11/06/2016 09/06/2016  Glucose 70 - 140 mg/dl 120 - 133  BUN 7.0 - 26.0 mg/dL 28.9(H) - 33.7(H)  Creatinine 0.7 - 1.3 mg/dL 8.1(HH) - 8.0(HH)  Sodium 136 - 145 mEq/L 141 - 139  Potassium 3.5 - 5.1 mEq/L 5.3(H) - 4.4  Chloride 101 - 111 mmol/L - - -  CO2 22 - 29 mEq/L 31(H) - 27  Calcium 8.4 - 10.4 mg/dL 10.0 - 10.3  Total Protein 6.0 - 8.5 g/dL 7.4 6.9 7.8  Total Bilirubin 0.20 - 1.20 mg/dL 0.80 - 0.82  Alkaline Phos 40 - 150 U/L 64 - 86  AST 5 - 34 U/L 21 - 21  ALT 0 - 55 U/L 17 - 20       Transthoracic Echocardiography  Patient:    Jacksen, Isip MR #:        [id number] Study Date: 07/03/2016 Gender:     M Age:        9 Height:     177.8 cm Weight:     88.7 kg BSA:        2.11 m^2 Pt. Status: Room:   SONOGRAPHER  [place], Outpatient  ATTENDING    [place], [place], [place] K[place]  REFERRING    [place], [place] Kish[place]  cc:  ------------------------------------------------------------------- LV EF: 60% -   65%  ------------------------------------------------------------------- History:   PMH:  On oral chemotherapy for AL Amyloidosis. Restless legs. Renal disorder on HD. Hypertension. Hyperlipidemia. H/O blood transfusion. GERD. Enlarged heart. Dyspnea. AL amyloidosis for cardiac function screening.  ------------------------------------------------------------------- Study Conclusions  - Left ventricle: The cavity size was normal. Wall thickness was   increased in a pattern of moderate LVH. Systolic function was   normal. The estimated ejection fraction was in the range of 60%   to 65%. Wall motion was normal; there were no regional wall   motion abnormalities. Features are consistent with a pseudonormal   left ventricular filling pattern, with concomitant abnormal   relaxation and increased filling pressure (grade 2 diastolic   dysfunction). - Aortic valve: There was mild stenosis. There was mild   regurgitation. Valve area (VTI): 1.7 cm^2. Valve area (Vmax):   1.91 cm^2. Valve area (Vmean): 1.66 cm^2. - Mitral valve: Mildly calcified annulus. There was mild   regurgitation.   RADIOGRAPHIC STUDIES: I have personally reviewed the radiological images as listed and agreed with the findings in the report.   ASSESSMENT & PLAN:    Mr Jose Garrett is a very pleasant 66 yo caucasian male with   1) AL Amyloidosis noted on renal biopsy with nephrotic range proteinuria on urinalysis and some lambda free light chains on serum and urinary IFE . On diagnosis:  SPEP with small amount of  M-spike 0.3  Echo showed no  systolic dysfunction but grade 2 diastolic dysfunction. [facility] cardiac staging 1 as per troponin and BNP criteria. Bone marrow with no overt evidence of multiple myeloma. PET/CT scan showed no evidence of bony lesions or lymphadenopathy . Urine showed about 15 g of protein in 24 hours   Patient is status post CyBorD x 6 cycles He was then on maintenance Velcade and had progression of his AL Amyloidosis. He was then switched to Ixazomib and Dexamethasone and has completed 6 cycles without significant toxicities. Echo repeated on 12/06/2015 shows no changes in heart function compared to previous echo from March 2017  ECHO rpt 4/28: stable EF.  Currently on maintenance Ixazomib q2weeks and tolerating it without any significant toxicities.  2) Thrombocytopenia mild likely from the Ixazomib. Patient is already on renally adjusted dose of the medication. PLT 75k - will continue to monitor  Plan -- No overt limiting toxicity from Ixazomib. Has grade 1-2 neuropathy from previous Velcade which has remained stable. Patient notes on worsening of symptoms.  --we will continue Ixazomib maintenance day 1 and 15 every 28 days --Would need to hold up his Ixazomib if platelet counts drop below 50,000 and would need to reduce dose an additional dose level down to 2.'3mg'$  --myeloma panel from today neg for M spike and serum free light chain results from today nl ratio. --continue on low-dose aspirin for VTE prophylaxis given his high risk of venous thromboembolism in the setting of nephrotic syndrome.  -continue Acyclovir for shingles prophylaxis. -Echocardiogram repeat would be next due in September/October 2018 .  2) multifactorial anemia - likely multifactorial -from AL amyloidosis, anemia from CKD. Hemoglobin is improved at 11.8 stable and is predominantly due to his significant chronic kidney disease B12 WNL -Previously deficient . B12 07/12/2016 -- 1035 Plan -Continue  ESA's and iron replacement as per nephrology. -continue B12 replacement SL   3) CKD stage V-related to hypertension and AL Amyloidosis. Has been started on hemodialysis through a tunnel catheter since his last clinic visit. Had AV fistula placement on 12/27/2015  Plan -Continue hemodialysis as per nephrologist.   4) Grade 1-2 peripheral neuropathy . Feet tingling and numbness ? Related to AL Amyloidosis vs Velcade vs B12 deficiency vs HD (now)  stable.  No evidence of change in symptoms. Plan -continue Ixazomib maintenance every other week dosing. -Dose to be taken after hemodialysis session on those days.  -Will need to monitor for neuropathy with his Ixazomib. Given option to hold Ninlaro and monitor . He chooses to continue current maintenance dosing. -continue neurontin '600mg'$  po HS.  -continue SL B12   B12 levels WNL with replacement on 07/12/16  RTC with Dr [name] in 2 months with rpt labs ECHO in 6 weeks  All questions were answered. The patient knows to call the clinic with any problems, questions or concerns. I spent 20 minutes counseling the patient face to face. The total time spent in the appointment was 25 minutes and more than 50% was on counseling.   Sullivan Lone MD [place] Hematology/Oncology Physician [facility]  (Office):       [phone] (Work cell):  [phone] (Fax):           [phone]

## 2016-11-14 DIAGNOSIS — D509 Iron deficiency anemia, unspecified: Secondary | ICD-10-CM | POA: Diagnosis not present

## 2016-11-14 DIAGNOSIS — N186 End stage renal disease: Secondary | ICD-10-CM | POA: Diagnosis not present

## 2016-11-14 DIAGNOSIS — Z23 Encounter for immunization: Secondary | ICD-10-CM | POA: Diagnosis not present

## 2016-11-14 DIAGNOSIS — D631 Anemia in chronic kidney disease: Secondary | ICD-10-CM | POA: Diagnosis not present

## 2016-11-14 DIAGNOSIS — N2581 Secondary hyperparathyroidism of renal origin: Secondary | ICD-10-CM | POA: Diagnosis not present

## 2016-11-16 DIAGNOSIS — N2581 Secondary hyperparathyroidism of renal origin: Secondary | ICD-10-CM | POA: Diagnosis not present

## 2016-11-16 DIAGNOSIS — D631 Anemia in chronic kidney disease: Secondary | ICD-10-CM | POA: Diagnosis not present

## 2016-11-16 DIAGNOSIS — N186 End stage renal disease: Secondary | ICD-10-CM | POA: Diagnosis not present

## 2016-11-16 DIAGNOSIS — D509 Iron deficiency anemia, unspecified: Secondary | ICD-10-CM | POA: Diagnosis not present

## 2016-11-16 DIAGNOSIS — Z23 Encounter for immunization: Secondary | ICD-10-CM | POA: Diagnosis not present

## 2016-11-19 DIAGNOSIS — N186 End stage renal disease: Secondary | ICD-10-CM | POA: Diagnosis not present

## 2016-11-19 DIAGNOSIS — N2581 Secondary hyperparathyroidism of renal origin: Secondary | ICD-10-CM | POA: Diagnosis not present

## 2016-11-19 DIAGNOSIS — D631 Anemia in chronic kidney disease: Secondary | ICD-10-CM | POA: Diagnosis not present

## 2016-11-19 DIAGNOSIS — D509 Iron deficiency anemia, unspecified: Secondary | ICD-10-CM | POA: Diagnosis not present

## 2016-11-19 DIAGNOSIS — Z23 Encounter for immunization: Secondary | ICD-10-CM | POA: Diagnosis not present

## 2016-11-21 DIAGNOSIS — N186 End stage renal disease: Secondary | ICD-10-CM | POA: Diagnosis not present

## 2016-11-21 DIAGNOSIS — D509 Iron deficiency anemia, unspecified: Secondary | ICD-10-CM | POA: Diagnosis not present

## 2016-11-21 DIAGNOSIS — Z23 Encounter for immunization: Secondary | ICD-10-CM | POA: Diagnosis not present

## 2016-11-21 DIAGNOSIS — D631 Anemia in chronic kidney disease: Secondary | ICD-10-CM | POA: Diagnosis not present

## 2016-11-21 DIAGNOSIS — N2581 Secondary hyperparathyroidism of renal origin: Secondary | ICD-10-CM | POA: Diagnosis not present

## 2016-11-23 DIAGNOSIS — Z23 Encounter for immunization: Secondary | ICD-10-CM | POA: Diagnosis not present

## 2016-11-23 DIAGNOSIS — D631 Anemia in chronic kidney disease: Secondary | ICD-10-CM | POA: Diagnosis not present

## 2016-11-23 DIAGNOSIS — D509 Iron deficiency anemia, unspecified: Secondary | ICD-10-CM | POA: Diagnosis not present

## 2016-11-23 DIAGNOSIS — N186 End stage renal disease: Secondary | ICD-10-CM | POA: Diagnosis not present

## 2016-11-23 DIAGNOSIS — N2581 Secondary hyperparathyroidism of renal origin: Secondary | ICD-10-CM | POA: Diagnosis not present

## 2016-11-26 DIAGNOSIS — Z23 Encounter for immunization: Secondary | ICD-10-CM | POA: Diagnosis not present

## 2016-11-26 DIAGNOSIS — D509 Iron deficiency anemia, unspecified: Secondary | ICD-10-CM | POA: Diagnosis not present

## 2016-11-26 DIAGNOSIS — N186 End stage renal disease: Secondary | ICD-10-CM | POA: Diagnosis not present

## 2016-11-26 DIAGNOSIS — D631 Anemia in chronic kidney disease: Secondary | ICD-10-CM | POA: Diagnosis not present

## 2016-11-26 DIAGNOSIS — N2581 Secondary hyperparathyroidism of renal origin: Secondary | ICD-10-CM | POA: Diagnosis not present

## 2016-11-28 DIAGNOSIS — D509 Iron deficiency anemia, unspecified: Secondary | ICD-10-CM | POA: Diagnosis not present

## 2016-11-28 DIAGNOSIS — N186 End stage renal disease: Secondary | ICD-10-CM | POA: Diagnosis not present

## 2016-11-28 DIAGNOSIS — N2581 Secondary hyperparathyroidism of renal origin: Secondary | ICD-10-CM | POA: Diagnosis not present

## 2016-11-28 DIAGNOSIS — Z23 Encounter for immunization: Secondary | ICD-10-CM | POA: Diagnosis not present

## 2016-11-28 DIAGNOSIS — D631 Anemia in chronic kidney disease: Secondary | ICD-10-CM | POA: Diagnosis not present

## 2016-11-30 DIAGNOSIS — N186 End stage renal disease: Secondary | ICD-10-CM | POA: Diagnosis not present

## 2016-11-30 DIAGNOSIS — D509 Iron deficiency anemia, unspecified: Secondary | ICD-10-CM | POA: Diagnosis not present

## 2016-11-30 DIAGNOSIS — N2581 Secondary hyperparathyroidism of renal origin: Secondary | ICD-10-CM | POA: Diagnosis not present

## 2016-11-30 DIAGNOSIS — Z23 Encounter for immunization: Secondary | ICD-10-CM | POA: Diagnosis not present

## 2016-11-30 DIAGNOSIS — D631 Anemia in chronic kidney disease: Secondary | ICD-10-CM | POA: Diagnosis not present

## 2016-12-03 DIAGNOSIS — Z23 Encounter for immunization: Secondary | ICD-10-CM | POA: Diagnosis not present

## 2016-12-03 DIAGNOSIS — D509 Iron deficiency anemia, unspecified: Secondary | ICD-10-CM | POA: Diagnosis not present

## 2016-12-03 DIAGNOSIS — N186 End stage renal disease: Secondary | ICD-10-CM | POA: Diagnosis not present

## 2016-12-03 DIAGNOSIS — D631 Anemia in chronic kidney disease: Secondary | ICD-10-CM | POA: Diagnosis not present

## 2016-12-03 DIAGNOSIS — N2581 Secondary hyperparathyroidism of renal origin: Secondary | ICD-10-CM | POA: Diagnosis not present

## 2016-12-05 DIAGNOSIS — D509 Iron deficiency anemia, unspecified: Secondary | ICD-10-CM | POA: Diagnosis not present

## 2016-12-05 DIAGNOSIS — N186 End stage renal disease: Secondary | ICD-10-CM | POA: Diagnosis not present

## 2016-12-05 DIAGNOSIS — D631 Anemia in chronic kidney disease: Secondary | ICD-10-CM | POA: Diagnosis not present

## 2016-12-05 DIAGNOSIS — N2581 Secondary hyperparathyroidism of renal origin: Secondary | ICD-10-CM | POA: Diagnosis not present

## 2016-12-05 DIAGNOSIS — Z23 Encounter for immunization: Secondary | ICD-10-CM | POA: Diagnosis not present

## 2016-12-07 DIAGNOSIS — D509 Iron deficiency anemia, unspecified: Secondary | ICD-10-CM | POA: Diagnosis not present

## 2016-12-07 DIAGNOSIS — Z23 Encounter for immunization: Secondary | ICD-10-CM | POA: Diagnosis not present

## 2016-12-07 DIAGNOSIS — D631 Anemia in chronic kidney disease: Secondary | ICD-10-CM | POA: Diagnosis not present

## 2016-12-07 DIAGNOSIS — N2581 Secondary hyperparathyroidism of renal origin: Secondary | ICD-10-CM | POA: Diagnosis not present

## 2016-12-07 DIAGNOSIS — N186 End stage renal disease: Secondary | ICD-10-CM | POA: Diagnosis not present

## 2016-12-09 DIAGNOSIS — Z992 Dependence on renal dialysis: Secondary | ICD-10-CM | POA: Diagnosis not present

## 2016-12-09 DIAGNOSIS — I129 Hypertensive chronic kidney disease with stage 1 through stage 4 chronic kidney disease, or unspecified chronic kidney disease: Secondary | ICD-10-CM | POA: Diagnosis not present

## 2016-12-09 DIAGNOSIS — N186 End stage renal disease: Secondary | ICD-10-CM | POA: Diagnosis not present

## 2016-12-10 DIAGNOSIS — D509 Iron deficiency anemia, unspecified: Secondary | ICD-10-CM | POA: Diagnosis not present

## 2016-12-10 DIAGNOSIS — D631 Anemia in chronic kidney disease: Secondary | ICD-10-CM | POA: Diagnosis not present

## 2016-12-10 DIAGNOSIS — N2581 Secondary hyperparathyroidism of renal origin: Secondary | ICD-10-CM | POA: Diagnosis not present

## 2016-12-10 DIAGNOSIS — Z23 Encounter for immunization: Secondary | ICD-10-CM | POA: Diagnosis not present

## 2016-12-10 DIAGNOSIS — N186 End stage renal disease: Secondary | ICD-10-CM | POA: Diagnosis not present

## 2016-12-11 ENCOUNTER — Ambulatory Visit (HOSPITAL_COMMUNITY)
Admission: RE | Admit: 2016-12-11 | Discharge: 2016-12-11 | Disposition: A | Discharge status: Home / Self Care | Payer: Medicare Other | Source: Ambulatory Visit | Attending: Hematology | Admitting: Hematology

## 2016-12-11 DIAGNOSIS — I517 Cardiomegaly: Secondary | ICD-10-CM | POA: Insufficient documentation

## 2016-12-11 DIAGNOSIS — N186 End stage renal disease: Secondary | ICD-10-CM | POA: Diagnosis not present

## 2016-12-11 DIAGNOSIS — I080 Rheumatic disorders of both mitral and aortic valves: Secondary | ICD-10-CM | POA: Diagnosis not present

## 2016-12-11 DIAGNOSIS — E785 Hyperlipidemia, unspecified: Secondary | ICD-10-CM | POA: Insufficient documentation

## 2016-12-11 DIAGNOSIS — I120 Hypertensive chronic kidney disease with stage 5 chronic kidney disease or end stage renal disease: Secondary | ICD-10-CM | POA: Diagnosis not present

## 2016-12-11 DIAGNOSIS — R0600 Dyspnea, unspecified: Secondary | ICD-10-CM | POA: Diagnosis not present

## 2016-12-11 DIAGNOSIS — E8581 Light chain (AL) amyloidosis: Secondary | ICD-10-CM

## 2016-12-11 NOTE — Progress Notes (Signed)
  Echocardiogram 2D Echocardiogram has been performed.  Jose Garrett 12/11/2016, 9:31 AM

## 2016-12-12 DIAGNOSIS — D631 Anemia in chronic kidney disease: Secondary | ICD-10-CM | POA: Diagnosis not present

## 2016-12-12 DIAGNOSIS — N186 End stage renal disease: Secondary | ICD-10-CM | POA: Diagnosis not present

## 2016-12-12 DIAGNOSIS — Z23 Encounter for immunization: Secondary | ICD-10-CM | POA: Diagnosis not present

## 2016-12-12 DIAGNOSIS — N2581 Secondary hyperparathyroidism of renal origin: Secondary | ICD-10-CM | POA: Diagnosis not present

## 2016-12-12 DIAGNOSIS — D509 Iron deficiency anemia, unspecified: Secondary | ICD-10-CM | POA: Diagnosis not present

## 2016-12-14 DIAGNOSIS — N2581 Secondary hyperparathyroidism of renal origin: Secondary | ICD-10-CM | POA: Diagnosis not present

## 2016-12-14 DIAGNOSIS — D631 Anemia in chronic kidney disease: Secondary | ICD-10-CM | POA: Diagnosis not present

## 2016-12-14 DIAGNOSIS — Z23 Encounter for immunization: Secondary | ICD-10-CM | POA: Diagnosis not present

## 2016-12-14 DIAGNOSIS — N186 End stage renal disease: Secondary | ICD-10-CM | POA: Diagnosis not present

## 2016-12-14 DIAGNOSIS — D509 Iron deficiency anemia, unspecified: Secondary | ICD-10-CM | POA: Diagnosis not present

## 2016-12-17 DIAGNOSIS — N186 End stage renal disease: Secondary | ICD-10-CM | POA: Diagnosis not present

## 2016-12-17 DIAGNOSIS — D509 Iron deficiency anemia, unspecified: Secondary | ICD-10-CM | POA: Diagnosis not present

## 2016-12-17 DIAGNOSIS — Z23 Encounter for immunization: Secondary | ICD-10-CM | POA: Diagnosis not present

## 2016-12-17 DIAGNOSIS — N2581 Secondary hyperparathyroidism of renal origin: Secondary | ICD-10-CM | POA: Diagnosis not present

## 2016-12-17 DIAGNOSIS — D631 Anemia in chronic kidney disease: Secondary | ICD-10-CM | POA: Diagnosis not present

## 2016-12-19 DIAGNOSIS — D509 Iron deficiency anemia, unspecified: Secondary | ICD-10-CM | POA: Diagnosis not present

## 2016-12-19 DIAGNOSIS — Z23 Encounter for immunization: Secondary | ICD-10-CM | POA: Diagnosis not present

## 2016-12-19 DIAGNOSIS — D631 Anemia in chronic kidney disease: Secondary | ICD-10-CM | POA: Diagnosis not present

## 2016-12-19 DIAGNOSIS — N186 End stage renal disease: Secondary | ICD-10-CM | POA: Diagnosis not present

## 2016-12-19 DIAGNOSIS — N2581 Secondary hyperparathyroidism of renal origin: Secondary | ICD-10-CM | POA: Diagnosis not present

## 2016-12-21 DIAGNOSIS — D631 Anemia in chronic kidney disease: Secondary | ICD-10-CM | POA: Diagnosis not present

## 2016-12-21 DIAGNOSIS — N2581 Secondary hyperparathyroidism of renal origin: Secondary | ICD-10-CM | POA: Diagnosis not present

## 2016-12-21 DIAGNOSIS — Z23 Encounter for immunization: Secondary | ICD-10-CM | POA: Diagnosis not present

## 2016-12-21 DIAGNOSIS — N186 End stage renal disease: Secondary | ICD-10-CM | POA: Diagnosis not present

## 2016-12-21 DIAGNOSIS — D509 Iron deficiency anemia, unspecified: Secondary | ICD-10-CM | POA: Diagnosis not present

## 2016-12-24 DIAGNOSIS — N186 End stage renal disease: Secondary | ICD-10-CM | POA: Diagnosis not present

## 2016-12-24 DIAGNOSIS — Z23 Encounter for immunization: Secondary | ICD-10-CM | POA: Diagnosis not present

## 2016-12-24 DIAGNOSIS — D631 Anemia in chronic kidney disease: Secondary | ICD-10-CM | POA: Diagnosis not present

## 2016-12-24 DIAGNOSIS — D509 Iron deficiency anemia, unspecified: Secondary | ICD-10-CM | POA: Diagnosis not present

## 2016-12-24 DIAGNOSIS — N2581 Secondary hyperparathyroidism of renal origin: Secondary | ICD-10-CM | POA: Diagnosis not present

## 2016-12-26 DIAGNOSIS — Z23 Encounter for immunization: Secondary | ICD-10-CM | POA: Diagnosis not present

## 2016-12-26 DIAGNOSIS — N186 End stage renal disease: Secondary | ICD-10-CM | POA: Diagnosis not present

## 2016-12-26 DIAGNOSIS — N2581 Secondary hyperparathyroidism of renal origin: Secondary | ICD-10-CM | POA: Diagnosis not present

## 2016-12-26 DIAGNOSIS — D631 Anemia in chronic kidney disease: Secondary | ICD-10-CM | POA: Diagnosis not present

## 2016-12-26 DIAGNOSIS — D509 Iron deficiency anemia, unspecified: Secondary | ICD-10-CM | POA: Diagnosis not present

## 2016-12-28 DIAGNOSIS — D631 Anemia in chronic kidney disease: Secondary | ICD-10-CM | POA: Diagnosis not present

## 2016-12-28 DIAGNOSIS — N186 End stage renal disease: Secondary | ICD-10-CM | POA: Diagnosis not present

## 2016-12-28 DIAGNOSIS — Z23 Encounter for immunization: Secondary | ICD-10-CM | POA: Diagnosis not present

## 2016-12-28 DIAGNOSIS — D509 Iron deficiency anemia, unspecified: Secondary | ICD-10-CM | POA: Diagnosis not present

## 2016-12-28 DIAGNOSIS — N2581 Secondary hyperparathyroidism of renal origin: Secondary | ICD-10-CM | POA: Diagnosis not present

## 2016-12-31 DIAGNOSIS — D509 Iron deficiency anemia, unspecified: Secondary | ICD-10-CM | POA: Diagnosis not present

## 2016-12-31 DIAGNOSIS — N186 End stage renal disease: Secondary | ICD-10-CM | POA: Diagnosis not present

## 2016-12-31 DIAGNOSIS — D631 Anemia in chronic kidney disease: Secondary | ICD-10-CM | POA: Diagnosis not present

## 2016-12-31 DIAGNOSIS — N2581 Secondary hyperparathyroidism of renal origin: Secondary | ICD-10-CM | POA: Diagnosis not present

## 2016-12-31 DIAGNOSIS — Z23 Encounter for immunization: Secondary | ICD-10-CM | POA: Diagnosis not present

## 2017-01-02 DIAGNOSIS — D631 Anemia in chronic kidney disease: Secondary | ICD-10-CM | POA: Diagnosis not present

## 2017-01-02 DIAGNOSIS — Z23 Encounter for immunization: Secondary | ICD-10-CM | POA: Diagnosis not present

## 2017-01-02 DIAGNOSIS — N186 End stage renal disease: Secondary | ICD-10-CM | POA: Diagnosis not present

## 2017-01-02 DIAGNOSIS — D509 Iron deficiency anemia, unspecified: Secondary | ICD-10-CM | POA: Diagnosis not present

## 2017-01-02 DIAGNOSIS — N2581 Secondary hyperparathyroidism of renal origin: Secondary | ICD-10-CM | POA: Diagnosis not present

## 2017-01-04 DIAGNOSIS — N186 End stage renal disease: Secondary | ICD-10-CM | POA: Diagnosis not present

## 2017-01-04 DIAGNOSIS — D631 Anemia in chronic kidney disease: Secondary | ICD-10-CM | POA: Diagnosis not present

## 2017-01-04 DIAGNOSIS — Z23 Encounter for immunization: Secondary | ICD-10-CM | POA: Diagnosis not present

## 2017-01-04 DIAGNOSIS — D509 Iron deficiency anemia, unspecified: Secondary | ICD-10-CM | POA: Diagnosis not present

## 2017-01-04 DIAGNOSIS — N2581 Secondary hyperparathyroidism of renal origin: Secondary | ICD-10-CM | POA: Diagnosis not present

## 2017-01-07 DIAGNOSIS — N2581 Secondary hyperparathyroidism of renal origin: Secondary | ICD-10-CM | POA: Diagnosis not present

## 2017-01-07 DIAGNOSIS — N186 End stage renal disease: Secondary | ICD-10-CM | POA: Diagnosis not present

## 2017-01-07 DIAGNOSIS — Z23 Encounter for immunization: Secondary | ICD-10-CM | POA: Diagnosis not present

## 2017-01-07 DIAGNOSIS — D631 Anemia in chronic kidney disease: Secondary | ICD-10-CM | POA: Diagnosis not present

## 2017-01-07 DIAGNOSIS — D509 Iron deficiency anemia, unspecified: Secondary | ICD-10-CM | POA: Diagnosis not present

## 2017-01-08 ENCOUNTER — Other Ambulatory Visit (HOSPITAL_BASED_OUTPATIENT_CLINIC_OR_DEPARTMENT_OTHER): Payer: Medicare Other

## 2017-01-08 ENCOUNTER — Encounter: Payer: Self-pay | Admitting: Hematology

## 2017-01-08 ENCOUNTER — Encounter: Payer: Self-pay | Admitting: Neurology

## 2017-01-08 ENCOUNTER — Ambulatory Visit (HOSPITAL_BASED_OUTPATIENT_CLINIC_OR_DEPARTMENT_OTHER): Payer: Medicare Other | Admitting: Hematology

## 2017-01-08 ENCOUNTER — Telehealth: Payer: Self-pay | Admitting: Hematology

## 2017-01-08 VITALS — BP 151/76 | HR 86 | Temp 98.5°F | Resp 18 | Ht 70.0 in | Wt 200.4 lb

## 2017-01-08 DIAGNOSIS — E8581 Light chain (AL) amyloidosis: Secondary | ICD-10-CM

## 2017-01-08 DIAGNOSIS — D696 Thrombocytopenia, unspecified: Secondary | ICD-10-CM

## 2017-01-08 DIAGNOSIS — N183 Chronic kidney disease, stage 3 (moderate): Secondary | ICD-10-CM | POA: Diagnosis not present

## 2017-01-08 DIAGNOSIS — Z992 Dependence on renal dialysis: Secondary | ICD-10-CM

## 2017-01-08 DIAGNOSIS — G620 Drug-induced polyneuropathy: Secondary | ICD-10-CM | POA: Diagnosis not present

## 2017-01-08 DIAGNOSIS — N184 Chronic kidney disease, stage 4 (severe): Secondary | ICD-10-CM

## 2017-01-08 DIAGNOSIS — E538 Deficiency of other specified B group vitamins: Secondary | ICD-10-CM | POA: Diagnosis not present

## 2017-01-08 DIAGNOSIS — E8809 Other disorders of plasma-protein metabolism, not elsewhere classified: Secondary | ICD-10-CM

## 2017-01-08 DIAGNOSIS — I129 Hypertensive chronic kidney disease with stage 1 through stage 4 chronic kidney disease, or unspecified chronic kidney disease: Secondary | ICD-10-CM | POA: Diagnosis not present

## 2017-01-08 DIAGNOSIS — G629 Polyneuropathy, unspecified: Secondary | ICD-10-CM

## 2017-01-08 DIAGNOSIS — D631 Anemia in chronic kidney disease: Secondary | ICD-10-CM | POA: Diagnosis not present

## 2017-01-08 LAB — COMPREHENSIVE METABOLIC PANEL
ALBUMIN: 4.4 g/dL (ref 3.5–5.0)
ALK PHOS: 73 U/L (ref 40–150)
ALT: 20 U/L (ref 0–55)
AST: 19 U/L (ref 5–34)
Anion Gap: 15 mEq/L — ABNORMAL HIGH (ref 3–11)
BUN: 39.2 mg/dL — AB (ref 7.0–26.0)
CALCIUM: 10.6 mg/dL — AB (ref 8.4–10.4)
CO2: 27 mEq/L (ref 22–29)
CREATININE: 8.2 mg/dL — AB (ref 0.7–1.3)
Chloride: 95 mEq/L — ABNORMAL LOW (ref 98–109)
EGFR: 6 mL/min/{1.73_m2} — ABNORMAL LOW (ref >60)
GLUCOSE: 102 mg/dL (ref 70–140)
Potassium: 5.1 mEq/L (ref 3.5–5.1)
SODIUM: 138 meq/L (ref 136–145)
Total Bilirubin: 0.7 mg/dL (ref 0.20–1.20)
Total Protein: 8.4 g/dL — ABNORMAL HIGH (ref 6.4–8.3)

## 2017-01-08 LAB — CBC & DIFF AND RETIC
BASO%: 0.3 % (ref 0.0–2.0)
Basophils Absolute: 0 10*3/uL (ref 0.0–0.1)
EOS ABS: 0.2 10*3/uL (ref 0.0–0.5)
EOS%: 2.9 % (ref 0.0–7.0)
HEMATOCRIT: 36.7 % — AB (ref 38.4–49.9)
HEMOGLOBIN: 12.1 g/dL — AB (ref 13.0–17.1)
IMMATURE RETIC FRACT: 14.8 % — AB (ref 3.00–10.60)
LYMPH#: 1.4 10*3/uL (ref 0.9–3.3)
LYMPH%: 21.8 % (ref 14.0–49.0)
MCH: 35.1 pg — ABNORMAL HIGH (ref 27.2–33.4)
MCHC: 33 g/dL (ref 32.0–36.0)
MCV: 106.4 fL — ABNORMAL HIGH (ref 79.3–98.0)
MONO#: 0.7 10*3/uL (ref 0.1–0.9)
MONO%: 9.9 % (ref 0.0–14.0)
NEUT#: 4.3 10*3/uL (ref 1.5–6.5)
NEUT%: 65.1 % (ref 39.0–75.0)
Platelets: 105 10*3/uL — ABNORMAL LOW (ref 140–400)
RBC: 3.45 10*6/uL — ABNORMAL LOW (ref 4.20–5.82)
RDW: 15.3 % — ABNORMAL HIGH (ref 11.0–14.6)
RETIC CT ABS: 83.84 10*3/uL (ref 34.80–93.90)
Retic %: 2.43 % — ABNORMAL HIGH (ref 0.80–1.80)
WBC: 6.6 10*3/uL (ref 4.0–10.3)

## 2017-01-08 MED ORDER — LORAZEPAM 1 MG PO TABS: 1.0000 mg | ORAL_TABLET | Freq: Every day | ORAL | 0 refills | Status: DC | PRN

## 2017-01-08 NOTE — Telephone Encounter (Signed)
Patient declined avs and calendar  °

## 2017-01-08 NOTE — Patient Instructions (Signed)
Thank you for choosing [facility] to provide your oncology and hematology care.  To afford each patient quality time with our providers, please arrive 30 minutes before your scheduled appointment time.  If you arrive late for your appointment, you may be asked to reschedule.  We strive to give you quality time with our providers, and arriving late affects you and other patients whose appointments are after yours.   If you are a no show for multiple scheduled visits, you may be dismissed from the clinic at the providers discretion.    Again, thank you for choosing [facility], our hope is that these requests will decrease the amount of time that you wait before being seen by our physicians.  ______________________________________________________________________  Should you have questions after your visit to the [facility], please contact our office at [phone] between the hours of 8:30 and 4:30 p.m.    Voicemails left after 4:30p.m will not be returned until the following business day.    For prescription refill requests, please have your pharmacy contact us directly.  Please also try to allow 48 hours for prescription requests.    Please contact the scheduling department for questions regarding scheduling.  For scheduling of procedures such as PET scans, CT scans, MRI, Ultrasound, etc please contact central scheduling at [phone].    Resources For Cancer Patients and Caregivers:   Oncolink.org:  A wonderful resource for patients and healthcare providers for information regarding your disease, ways to tract your treatment, what to expect, etc.     American Cancer Society:  800-227-2345  Can help patients locate various types of support and financial assistance  Cancer Care: 1-800-813-HOPE (4673) Provides financial assistance, online support groups, medication/co-pay assistance.    [place] DSS:  336-641-3447 Where to apply for food  stamps, Medicaid, and utility assistance  Medicare Rights Center: 800-333-4114 Helps people with Medicare understand their rights and benefits, navigate the Medicare system, and secure the quality healthcare they deserve  SCAT: 336-333-6589 [employer]'s shared-ride transportation service for eligible riders who have a disability that prevents them from riding the fixed route bus.    For additional information on assistance programs please contact our social worker:   Grier Hock/Abigail Elmore:  [phone]            

## 2017-01-08 NOTE — Progress Notes (Signed)
.    Hematology/Oncology clinic note  Date of service: 01/08/17  Patient Care Team: [name], MD as PCP - General (Family Medicine) [name], MD as PCP - Hematology/Oncology (Hematology and Oncology) [name], MD as Consulting Physician (Nephrology)  CHIEF COMPLAINTS: followup for AL amyloidosis.  HISTORY OF PRESENTING ILLNESS: Please see my initial consultation for details on presentation.  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 AL amyloidosis noted on kidney biopsy diagnosed 09/28/2014.    Current treatment Ixazomib maintenance q2weeks   previous treatment  CyBorD x 6 cycles Evaluated for and declined Auto HSCT at [facility]. - maintenance Velcade  Ixazomib + Dexamethasone s/p 6 cycles  INTERVAL HISTORY  [name] is here for his scheduled 2 month follow-up for AL amyloidosis. He reports that things have overall been okay for him. He states that his grade 1-2 neuropathy is perhaps worsened a little since we last saw hi though he has reduced his neurontin dose some and notes symptoms vary on a day to day basis. He believes that this is more related to his dialysis rather than his maintenance Ixazomib. He states that this has sometimes prevent him from obtaining adequate rest. We have given him Neurontin which he takes 34m nightly and is currently hesistant to increase the dose.  His dialysis has otherwise been going well. His neuropathy has only been present in his feet. His last myeloma panels and SFLC continue to show NED or abnormal proteins. Hgb has improved with EPO injections and platelets are trending well.  His most recent ECHO shows normal ejection fraction, with no new acute or worsening changes of his heart. He is requesting a refill of Ativan today.     MEDICAL HISTORY:  Past Medical History:  Diagnosis Date  . Amyloidosis ([place]   . Anemia Dx 2016  . Chronic diastolic  CHF (congestive heart failure), NYHA class 1 ([place]    /hospital problem list 09/08/2014  . Chronic renal disease, stage III ([place]    /hospital problem list 09/08/2014  . Diarrhea   . Dyspnea    with exertion  . Enlarged heart 2015  . Family history of adverse reaction to anesthesia    "my daughter can't take certain anesthesia agents" (09/08/2014)  . GERD (gastroesophageal reflux disease)   . History of blood transfusion 09/08/2014   "got hematoma after renal biopsy & HgB dropped"  . Hyperlipidemia Dx 2012  . Hypertension Dx 2012  . Renal disorder    "there is a spot on my kidney" (09/08/2014)  . Restless legs     SURGICAL HISTORY: Past Surgical History:  Procedure Laterality Date  . AV FISTULA PLACEMENT Right 12/27/2015   Procedure: Right Arm ARTERIOVENOUS (AV) FISTULA CREATION;  Surgeon: CAngelia Mould MD;  [facility] [facility]  Service: Vascular;  Laterality: Right;  . COLONOSCOPY    . IR GENERIC HISTORICAL  01/27/2016   IR FLUORO GUIDE CV LINE RIGHT 01/27/2016 DArne Cleveland MD [facility]INTER[facility]  . IR GENERIC HISTORICAL  01/27/2016   IR [place] ACCESS RIGHT 01/27/2016 DArne Cleveland MD [facility]  . LAPAROSCOPIC CHOLECYSTECTOMY  03/2011  . RENAL BIOPSY, PERCUTANEOUS Right 09/08/2014  . TONSILLECTOMY  ~ 1960    SOCIAL HISTORY: Social History   Social History  . Marital status: Divorced    Spouse name: N/A  . Number of children: N/A  . Years of education: N/A   Occupational History  . Not on file.  Social History Main Topics  . Smoking status: Former Smoker    Packs/day: 2.00    Years: 40.00    Types: Cigarettes    Quit date: 03/19/2011  . Smokeless tobacco: Never Used  . Alcohol use No  . Drug use: No     Comment: 09/08/2014 "I have used marijuana till the 1990's". Notes that he quit 15 yrs ago  . Sexual activity: Not Currently   Other Topics Concern  . Not on file   Social History Narrative  . 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 Lives with his daughter and her finance.  FAMILY HISTORY: Family History  Problem Relation Age of Onset  . Hypertension Mother   . Diabetes Mother   . Heart disease Mother   . Skin cancer Mother   . Hypertension Father   . Diabetes Father   . Diabetes Sister     ALLERGIES:  is allergic to no known allergies.  MEDICATIONS:  Current Outpatient Prescriptions  Medication Sig Dispense Refill  . acetaminophen (TYLENOL) 325 MG tablet Take 325-650 mg by mouth every 6 (six) hours as needed (back pain).    [name] acyclovir (ZOVIRAX) 400 MG tablet TAKE 1 TABLET BY MOUTH DAILY. (DOSE ADJUSTED FOR RENAL FUNCTION) 60 tablet 3  . amitriptyline (ELAVIL) 50 MG tablet Take 50 mg by mouth at bedtime.    [name] aspirin EC 81 MG tablet Take 1 tablet (81 mg total) by mouth daily. 30 tablet 11  . B Complex-C-Folic Acid (DIALYVITE 696) 0.8 MG TABS Take 0.8 mg by mouth daily.    . calcium acetate (PHOSLO) 667 MG capsule Take 1 capsule (667 mg total) by mouth 3 (three) times daily with meals. 90 capsule 0  . cetirizine (ZYRTEC) 10 MG tablet Take 10 mg by mouth daily.    . Cholecalciferol (VITAMIN D3) 3000 units TABS Take 3,000 Units by mouth daily.    . cyanocobalamin 2000 MCG tablet Take 2,500 mcg by mouth daily.    [name] Epoetin Alfa (PROCRIT IJ) Inject as directed every 14 (fourteen) days. Done at [facility] Last injection 01/19/16    . FLONASE 50 MCG/ACT nasal spray Place 1 spray into both nostrils daily as needed. (Patient taking differently: Place 1 spray into both nostrils daily as needed for allergies or rhinitis. )    . gabapentin (NEURONTIN) 250 MG/5ML solution Take 6 mLs (300 mg total) by mouth at bedtime. 470 mL 0  . ixazomib citrate (NINLARO) 3 MG capsule Take 1 capsule (3 mg total) by mouth every 14 (fourteen) days. Every other Thursday 6 capsule 2  . lanthanum (FOSRENOL) 1000 MG chewable tablet Chew 1,000 mg by mouth 2 (two) times daily with a meal.    .  LORazepam (ATIVAN) 1 MG tablet Take 1 tablet (1 mg total) by mouth daily as needed for anxiety (nausea). 30 tablet 0  . multivitamin (RENA-VIT) TABS tablet Take 1 tablet by mouth daily.    . VELPHORO 500 MG chewable tablet Chew by mouth daily.  10   No current facility-administered medications for this visit.     REVIEW OF SYSTEMS:   A 10+ POINT REVIEW OF SYSTEMS WAS OBTAINED including neurology, dermatology, psychiatry, cardiac, respiratory, lymph, extremities, GI, GU, Musculoskeletal, constitutional, breasts, reproductive, HEENT.  All pertinent positives are noted in the HPI.  All others are negative.  PHYSICAL EXAMINATION: ECOG PERFORMANCE STATUS: 2 - Symptomatic, <50% confined to bed  Vitals:  01/08/17 0931  BP: (!) 151/76  Pulse: 86  Resp: 18  Temp: 98.5 F (36.9 C)  SpO2: 96%   Filed Weights   01/08/17 0931  Weight: 200 lb 6.4 oz (90.9 kg)    GENERAL:alert, Mild this is due to sinus congestion and sore throat and cough. SKIN: Ecchymosis on upper extremities. EYES: nl EOM, PERL OROPHARYNX: No JVD No cervical adenopathy.  NECK: no JVD LYMPH:  no palpable lymphadenopathy in the cervical, axillary or inguinal LUNGS: clear to auscultation and percussion with normal breathing effort HEART: regular rate & rhythm and 2/6 SM aortic area ABDOMEN:abdomen soft, non-tender and normal bowel sounds Musculoskeletal: B/l trace pitting pedal edema No Tenderness. No redness.  PSYCH: alert & oriented x 3 with fluent speech NEURO: no focal motor/sensory deficits  LABORATORY DATA:   . CBC Latest Ref Rng & Units 01/08/2017 11/06/2016 09/06/2016  WBC 4.0 - 10.3 10e3/uL 6.6 4.1 5.1  Hemoglobin 13.0 - 17.1 g/dL 12.1(L) 10.8(L) 10.9(L)  Hematocrit 38.4 - 49.9 % 36.7(L) 33.0(L) 32.7(L)  Platelets 140 - 400 10e3/uL 105(L) 75(L) 71(L)   . CMP Latest Ref Rng & Units 01/08/2017 11/06/2016 11/06/2016  Glucose 70 - 140 mg/dl 102 120 -  BUN 7.0 - 26.0 mg/dL 39.2(H) 28.9(H) -  Creatinine 0.7 -  1.3 mg/dL 8.2(HH) 8.1(HH) -  Sodium 136 - 145 mEq/L 138 141 -  Potassium 3.5 - 5.1 mEq/L 5.1 5.3(H) -  Chloride 101 - 111 mmol/L - - -  CO2 22 - 29 mEq/L 27 31(H) -  Calcium 8.4 - 10.4 mg/dL 10.6(H) 10.0 -  Total Protein 6.4 - 8.3 g/dL 8.4(H) 7.4 6.9  Total Bilirubin 0.20 - 1.20 mg/dL 0.70 0.80 -  Alkaline Phos 40 - 150 U/L 73 64 -  AST 5 - 34 U/L 19 21 -  ALT 0 - 55 U/L 20 17 -       Echocardiogram 12/11/16 Impressions:  - Normal LV systolic function; moderate LVH; mild diastolic dysfunction with elevated LV filling pressure; calcified aortic valve with mild AS and mild AI; mild MR.  RADIOGRAPHIC STUDIES: I have personally reviewed the radiological images as listed and agreed with the findings in the report.   ASSESSMENT & PLAN:   Mr Jose Garrett is a very pleasant 66 yo caucasian male with   1) AL Amyloidosis noted on renal biopsy with nephrotic range proteinuria on urinalysis and some lambda free light chains on serum and urinary IFE . On diagnosis:  SPEP with small amount of M-spike 0.3  Echo showed no systolic dysfunction but grade 2 diastolic dysfunction. [facility] cardiac staging 1 as per troponin and BNP criteria. Bone marrow with no overt evidence of multiple myeloma. PET/CT scan showed no evidence of bony lesions or lymphadenopathy . Urine showed about 15 g of protein in 24 hours   Patient is status post CyBorD x 6 cycles He was then on maintenance Velcade and had progression of his AL Amyloidosis. He was then switched to Ixazomib and Dexamethasone and has completed 6 cycles without significant toxicities. Echo repeated on 12/11/2016 shows no changes in heart function compared to previous echo from earlier this year.  Myeloma panel/SFLC WNL with no evidence of progressive disease.  Currently on maintenance Ixazomib q2weeks and tolerating it without any significant new toxicities. Stable multifactorial neuropathy.  2) Thrombocytopenia mild likely from the Ixazomib.  Patient is already on renally adjusted dose of the medication. PLT 105k - will continue to monitor, this has improved from 75K 58moago  Plan -- No overt prohibitive toxicity from Ixazomib (which has already been de-escalated).  -Has grade 1-2 neuropathy from previous Velcade/B12 def ./HD which has worsened slightly?. He has had back pain issues as well and cannot r/o a component of radiculopathy. We will refer to Neurology to help evaluate and mx this further and consider EMG/NCS  --we will continue Ixazomib maintenance day 1 and 15 every 28 days. Patient hesitant to reduce dose or hold treatment since this has controlled his AL Amyloidosis very well. --Would need to hold up his Ixazomib if platelet counts drop below 50,000 and would need to reduce dose an additional dose level down to 2.64m --myeloma panel from 233mogo neg for M spike and serum free light chain results from 79m20moo w/ nl ratio. --continue on low-dose aspirin for VTE prophylaxis given his high risk of venous thromboembolism in the setting of nephrotic syndrome.  -continue Acyclovir for shingles prophylaxis. -Echocardiogram repeat would be next due in 25mo51mo2) multifactorial anemia - likely multifactorial -from AL amyloidosis, anemia from CKD. Hemoglobin is improved at 12.1 stable and is predominantly due to his significant chronic kidney disease B12 WNL -Previously deficient. B12 07/12/2016 -- 1035 Plan -Continue ESA's and iron replacement as per nephrology. -continue B12 replacement SL   3) CKD stage V-related to hypertension and AL Amyloidosis. Has been started on hemodialysis through a tunnel catheter since his last clinic visit. Had AV fistula placement on 12/27/2015  Plan -Continue hemodialysis as per nephrologist.   4) Grade 1-2 peripheral neuropathy, worsening recently. Feet tingling and numbness ? Related to AL Amyloidosis vs Velcade vs B12 deficiency vs HD (now)vs radiculopathy from DDD  stable.  Plan -continue  Ixazomib maintenance every other week dosing. -Dose to be taken after hemodialysis session on those days.  -Will need to monitor for neuropathy with his Ixazomib. Given option to hold Ninlaro and monitor. He chooses to continue current maintenance dosing. -continue neurontin 600mg679mHS and adjust dose accordingly -continue SL B12   B12 levels WNL with replacement on 07/12/16 -We will refer to Neurology to have their input on this.   RTC with Dr Kale [name] months with rpt labs  All questions were answered. The patient knows to call the clinic with any problems, questions or concerns. I spent 20 minutes counseling the patient face to face. The total time spent in the appointment was 25 minutes and more than 50% was on counseling.   GautaSullivan LoneS Hematology/Oncology Physician [facility]):       [phone] cell):  [phone]           [phone] document serves as a record of services personally performed by GautaSullivan Lone It was created on his behalf by WilliReola Mosherrained medical scribe. The creation of this record is based on the scribe's personal observations and the provider's statements to them. This document has been checked and approved by the attending provider.

## 2017-01-09 DIAGNOSIS — D509 Iron deficiency anemia, unspecified: Secondary | ICD-10-CM | POA: Diagnosis not present

## 2017-01-09 DIAGNOSIS — I129 Hypertensive chronic kidney disease with stage 1 through stage 4 chronic kidney disease, or unspecified chronic kidney disease: Secondary | ICD-10-CM | POA: Diagnosis not present

## 2017-01-09 DIAGNOSIS — N186 End stage renal disease: Secondary | ICD-10-CM | POA: Diagnosis not present

## 2017-01-09 DIAGNOSIS — Z23 Encounter for immunization: Secondary | ICD-10-CM | POA: Diagnosis not present

## 2017-01-09 DIAGNOSIS — N2581 Secondary hyperparathyroidism of renal origin: Secondary | ICD-10-CM | POA: Diagnosis not present

## 2017-01-09 DIAGNOSIS — Z992 Dependence on renal dialysis: Secondary | ICD-10-CM | POA: Diagnosis not present

## 2017-01-09 DIAGNOSIS — D631 Anemia in chronic kidney disease: Secondary | ICD-10-CM | POA: Diagnosis not present

## 2017-01-09 LAB — KAPPA/LAMBDA LIGHT CHAINS
IG LAMBDA FREE LIGHT CHAIN: 117.1 mg/L — AB (ref 5.7–26.3)
Ig Kappa Free Light Chain: 145.3 mg/L — ABNORMAL HIGH (ref 3.3–19.4)
KAPPA/LAMBDA FLC RATIO: 1.24 (ref 0.26–1.65)

## 2017-01-09 LAB — VITAMIN B12

## 2017-01-10 LAB — MULTIPLE MYELOMA PANEL, SERUM
ALBUMIN/GLOB SERPL: 1.1 (ref 0.7–1.7)
Albumin SerPl Elph-Mcnc: 3.9 g/dL (ref 2.9–4.4)
Alpha 1: 0.3 g/dL (ref 0.0–0.4)
Alpha2 Glob SerPl Elph-Mcnc: 1 g/dL (ref 0.4–1.0)
B-GLOBULIN SERPL ELPH-MCNC: 1.1 g/dL (ref 0.7–1.3)
GAMMA GLOB SERPL ELPH-MCNC: 1.2 g/dL (ref 0.4–1.8)
GLOBULIN, TOTAL: 3.6 g/dL (ref 2.2–3.9)
IGA/IMMUNOGLOBULIN A, SERUM: 112 mg/dL (ref 61–437)
IgG, Qn, Serum: 1021 mg/dL (ref 700–1600)
IgM, Qn, Serum: 158 mg/dL (ref 20–172)
Total Protein: 7.5 g/dL (ref 6.0–8.5)

## 2017-01-11 DIAGNOSIS — D509 Iron deficiency anemia, unspecified: Secondary | ICD-10-CM | POA: Diagnosis not present

## 2017-01-11 DIAGNOSIS — D631 Anemia in chronic kidney disease: Secondary | ICD-10-CM | POA: Diagnosis not present

## 2017-01-11 DIAGNOSIS — N186 End stage renal disease: Secondary | ICD-10-CM | POA: Diagnosis not present

## 2017-01-11 DIAGNOSIS — N2581 Secondary hyperparathyroidism of renal origin: Secondary | ICD-10-CM | POA: Diagnosis not present

## 2017-01-11 DIAGNOSIS — Z23 Encounter for immunization: Secondary | ICD-10-CM | POA: Diagnosis not present

## 2017-01-14 DIAGNOSIS — N186 End stage renal disease: Secondary | ICD-10-CM | POA: Diagnosis not present

## 2017-01-14 DIAGNOSIS — N2581 Secondary hyperparathyroidism of renal origin: Secondary | ICD-10-CM | POA: Diagnosis not present

## 2017-01-14 DIAGNOSIS — D509 Iron deficiency anemia, unspecified: Secondary | ICD-10-CM | POA: Diagnosis not present

## 2017-01-14 DIAGNOSIS — Z23 Encounter for immunization: Secondary | ICD-10-CM | POA: Diagnosis not present

## 2017-01-14 DIAGNOSIS — D631 Anemia in chronic kidney disease: Secondary | ICD-10-CM | POA: Diagnosis not present

## 2017-01-16 DIAGNOSIS — D509 Iron deficiency anemia, unspecified: Secondary | ICD-10-CM | POA: Diagnosis not present

## 2017-01-16 DIAGNOSIS — N186 End stage renal disease: Secondary | ICD-10-CM | POA: Diagnosis not present

## 2017-01-16 DIAGNOSIS — N2581 Secondary hyperparathyroidism of renal origin: Secondary | ICD-10-CM | POA: Diagnosis not present

## 2017-01-16 DIAGNOSIS — D631 Anemia in chronic kidney disease: Secondary | ICD-10-CM | POA: Diagnosis not present

## 2017-01-16 DIAGNOSIS — Z23 Encounter for immunization: Secondary | ICD-10-CM | POA: Diagnosis not present

## 2017-01-18 DIAGNOSIS — D631 Anemia in chronic kidney disease: Secondary | ICD-10-CM | POA: Diagnosis not present

## 2017-01-18 DIAGNOSIS — Z23 Encounter for immunization: Secondary | ICD-10-CM | POA: Diagnosis not present

## 2017-01-18 DIAGNOSIS — D509 Iron deficiency anemia, unspecified: Secondary | ICD-10-CM | POA: Diagnosis not present

## 2017-01-18 DIAGNOSIS — N186 End stage renal disease: Secondary | ICD-10-CM | POA: Diagnosis not present

## 2017-01-18 DIAGNOSIS — N2581 Secondary hyperparathyroidism of renal origin: Secondary | ICD-10-CM | POA: Diagnosis not present

## 2017-01-21 DIAGNOSIS — N2581 Secondary hyperparathyroidism of renal origin: Secondary | ICD-10-CM | POA: Diagnosis not present

## 2017-01-21 DIAGNOSIS — D631 Anemia in chronic kidney disease: Secondary | ICD-10-CM | POA: Diagnosis not present

## 2017-01-21 DIAGNOSIS — Z23 Encounter for immunization: Secondary | ICD-10-CM | POA: Diagnosis not present

## 2017-01-21 DIAGNOSIS — N186 End stage renal disease: Secondary | ICD-10-CM | POA: Diagnosis not present

## 2017-01-21 DIAGNOSIS — D509 Iron deficiency anemia, unspecified: Secondary | ICD-10-CM | POA: Diagnosis not present

## 2017-01-23 DIAGNOSIS — D631 Anemia in chronic kidney disease: Secondary | ICD-10-CM | POA: Diagnosis not present

## 2017-01-23 DIAGNOSIS — D509 Iron deficiency anemia, unspecified: Secondary | ICD-10-CM | POA: Diagnosis not present

## 2017-01-23 DIAGNOSIS — Z23 Encounter for immunization: Secondary | ICD-10-CM | POA: Diagnosis not present

## 2017-01-23 DIAGNOSIS — N186 End stage renal disease: Secondary | ICD-10-CM | POA: Diagnosis not present

## 2017-01-23 DIAGNOSIS — N2581 Secondary hyperparathyroidism of renal origin: Secondary | ICD-10-CM | POA: Diagnosis not present

## 2017-01-25 DIAGNOSIS — N2581 Secondary hyperparathyroidism of renal origin: Secondary | ICD-10-CM | POA: Diagnosis not present

## 2017-01-25 DIAGNOSIS — D509 Iron deficiency anemia, unspecified: Secondary | ICD-10-CM | POA: Diagnosis not present

## 2017-01-25 DIAGNOSIS — Z23 Encounter for immunization: Secondary | ICD-10-CM | POA: Diagnosis not present

## 2017-01-25 DIAGNOSIS — D631 Anemia in chronic kidney disease: Secondary | ICD-10-CM | POA: Diagnosis not present

## 2017-01-25 DIAGNOSIS — N186 End stage renal disease: Secondary | ICD-10-CM | POA: Diagnosis not present

## 2017-01-27 DIAGNOSIS — D631 Anemia in chronic kidney disease: Secondary | ICD-10-CM | POA: Diagnosis not present

## 2017-01-27 DIAGNOSIS — D509 Iron deficiency anemia, unspecified: Secondary | ICD-10-CM | POA: Diagnosis not present

## 2017-01-27 DIAGNOSIS — N186 End stage renal disease: Secondary | ICD-10-CM | POA: Diagnosis not present

## 2017-01-27 DIAGNOSIS — N2581 Secondary hyperparathyroidism of renal origin: Secondary | ICD-10-CM | POA: Diagnosis not present

## 2017-01-27 DIAGNOSIS — Z23 Encounter for immunization: Secondary | ICD-10-CM | POA: Diagnosis not present

## 2017-01-29 DIAGNOSIS — Z23 Encounter for immunization: Secondary | ICD-10-CM | POA: Diagnosis not present

## 2017-01-29 DIAGNOSIS — N186 End stage renal disease: Secondary | ICD-10-CM | POA: Diagnosis not present

## 2017-01-29 DIAGNOSIS — N2581 Secondary hyperparathyroidism of renal origin: Secondary | ICD-10-CM | POA: Diagnosis not present

## 2017-01-29 DIAGNOSIS — D631 Anemia in chronic kidney disease: Secondary | ICD-10-CM | POA: Diagnosis not present

## 2017-01-29 DIAGNOSIS — D509 Iron deficiency anemia, unspecified: Secondary | ICD-10-CM | POA: Diagnosis not present

## 2017-01-30 ENCOUNTER — Other Ambulatory Visit (INDEPENDENT_AMBULATORY_CARE_PROVIDER_SITE_OTHER): Payer: Self-pay

## 2017-01-30 NOTE — Telephone Encounter (Signed)
Dr. [name] Pt.

## 2017-02-01 ENCOUNTER — Other Ambulatory Visit: Payer: Self-pay

## 2017-02-01 DIAGNOSIS — D631 Anemia in chronic kidney disease: Secondary | ICD-10-CM | POA: Diagnosis not present

## 2017-02-01 DIAGNOSIS — N186 End stage renal disease: Secondary | ICD-10-CM | POA: Diagnosis not present

## 2017-02-01 DIAGNOSIS — D509 Iron deficiency anemia, unspecified: Secondary | ICD-10-CM | POA: Diagnosis not present

## 2017-02-01 DIAGNOSIS — Z23 Encounter for immunization: Secondary | ICD-10-CM | POA: Diagnosis not present

## 2017-02-01 DIAGNOSIS — N2581 Secondary hyperparathyroidism of renal origin: Secondary | ICD-10-CM | POA: Diagnosis not present

## 2017-02-01 MED ORDER — GABAPENTIN 250 MG/5ML PO SOLN: 300.0000 mg | Freq: Every day | ORAL | 0 refills | Status: DC

## 2017-02-04 DIAGNOSIS — D631 Anemia in chronic kidney disease: Secondary | ICD-10-CM | POA: Diagnosis not present

## 2017-02-04 DIAGNOSIS — N186 End stage renal disease: Secondary | ICD-10-CM | POA: Diagnosis not present

## 2017-02-04 DIAGNOSIS — D509 Iron deficiency anemia, unspecified: Secondary | ICD-10-CM | POA: Diagnosis not present

## 2017-02-04 DIAGNOSIS — Z23 Encounter for immunization: Secondary | ICD-10-CM | POA: Diagnosis not present

## 2017-02-04 DIAGNOSIS — N2581 Secondary hyperparathyroidism of renal origin: Secondary | ICD-10-CM | POA: Diagnosis not present

## 2017-02-06 DIAGNOSIS — D509 Iron deficiency anemia, unspecified: Secondary | ICD-10-CM | POA: Diagnosis not present

## 2017-02-06 DIAGNOSIS — D631 Anemia in chronic kidney disease: Secondary | ICD-10-CM | POA: Diagnosis not present

## 2017-02-06 DIAGNOSIS — N2581 Secondary hyperparathyroidism of renal origin: Secondary | ICD-10-CM | POA: Diagnosis not present

## 2017-02-06 DIAGNOSIS — Z23 Encounter for immunization: Secondary | ICD-10-CM | POA: Diagnosis not present

## 2017-02-06 DIAGNOSIS — N186 End stage renal disease: Secondary | ICD-10-CM | POA: Diagnosis not present

## 2017-02-08 DIAGNOSIS — I129 Hypertensive chronic kidney disease with stage 1 through stage 4 chronic kidney disease, or unspecified chronic kidney disease: Secondary | ICD-10-CM | POA: Diagnosis not present

## 2017-02-08 DIAGNOSIS — N2581 Secondary hyperparathyroidism of renal origin: Secondary | ICD-10-CM | POA: Diagnosis not present

## 2017-02-08 DIAGNOSIS — D631 Anemia in chronic kidney disease: Secondary | ICD-10-CM | POA: Diagnosis not present

## 2017-02-08 DIAGNOSIS — Z992 Dependence on renal dialysis: Secondary | ICD-10-CM | POA: Diagnosis not present

## 2017-02-08 DIAGNOSIS — D509 Iron deficiency anemia, unspecified: Secondary | ICD-10-CM | POA: Diagnosis not present

## 2017-02-08 DIAGNOSIS — N186 End stage renal disease: Secondary | ICD-10-CM | POA: Diagnosis not present

## 2017-02-08 DIAGNOSIS — Z23 Encounter for immunization: Secondary | ICD-10-CM | POA: Diagnosis not present

## 2017-02-11 DIAGNOSIS — N186 End stage renal disease: Secondary | ICD-10-CM | POA: Diagnosis not present

## 2017-02-11 DIAGNOSIS — N2581 Secondary hyperparathyroidism of renal origin: Secondary | ICD-10-CM | POA: Diagnosis not present

## 2017-02-11 DIAGNOSIS — D631 Anemia in chronic kidney disease: Secondary | ICD-10-CM | POA: Diagnosis not present

## 2017-02-11 DIAGNOSIS — D509 Iron deficiency anemia, unspecified: Secondary | ICD-10-CM | POA: Diagnosis not present

## 2017-02-13 DIAGNOSIS — N186 End stage renal disease: Secondary | ICD-10-CM | POA: Diagnosis not present

## 2017-02-13 DIAGNOSIS — D631 Anemia in chronic kidney disease: Secondary | ICD-10-CM | POA: Diagnosis not present

## 2017-02-13 DIAGNOSIS — D509 Iron deficiency anemia, unspecified: Secondary | ICD-10-CM | POA: Diagnosis not present

## 2017-02-13 DIAGNOSIS — N2581 Secondary hyperparathyroidism of renal origin: Secondary | ICD-10-CM | POA: Diagnosis not present

## 2017-02-15 DIAGNOSIS — D509 Iron deficiency anemia, unspecified: Secondary | ICD-10-CM | POA: Diagnosis not present

## 2017-02-15 DIAGNOSIS — D631 Anemia in chronic kidney disease: Secondary | ICD-10-CM | POA: Diagnosis not present

## 2017-02-15 DIAGNOSIS — N2581 Secondary hyperparathyroidism of renal origin: Secondary | ICD-10-CM | POA: Diagnosis not present

## 2017-02-15 DIAGNOSIS — N186 End stage renal disease: Secondary | ICD-10-CM | POA: Diagnosis not present

## 2017-02-18 DIAGNOSIS — N186 End stage renal disease: Secondary | ICD-10-CM | POA: Diagnosis not present

## 2017-02-18 DIAGNOSIS — D631 Anemia in chronic kidney disease: Secondary | ICD-10-CM | POA: Diagnosis not present

## 2017-02-18 DIAGNOSIS — N2581 Secondary hyperparathyroidism of renal origin: Secondary | ICD-10-CM | POA: Diagnosis not present

## 2017-02-18 DIAGNOSIS — D509 Iron deficiency anemia, unspecified: Secondary | ICD-10-CM | POA: Diagnosis not present

## 2017-02-20 DIAGNOSIS — D631 Anemia in chronic kidney disease: Secondary | ICD-10-CM | POA: Diagnosis not present

## 2017-02-20 DIAGNOSIS — D509 Iron deficiency anemia, unspecified: Secondary | ICD-10-CM | POA: Diagnosis not present

## 2017-02-20 DIAGNOSIS — N2581 Secondary hyperparathyroidism of renal origin: Secondary | ICD-10-CM | POA: Diagnosis not present

## 2017-02-20 DIAGNOSIS — N186 End stage renal disease: Secondary | ICD-10-CM | POA: Diagnosis not present

## 2017-02-22 DIAGNOSIS — D631 Anemia in chronic kidney disease: Secondary | ICD-10-CM | POA: Diagnosis not present

## 2017-02-22 DIAGNOSIS — N2581 Secondary hyperparathyroidism of renal origin: Secondary | ICD-10-CM | POA: Diagnosis not present

## 2017-02-22 DIAGNOSIS — D509 Iron deficiency anemia, unspecified: Secondary | ICD-10-CM | POA: Diagnosis not present

## 2017-02-22 DIAGNOSIS — N186 End stage renal disease: Secondary | ICD-10-CM | POA: Diagnosis not present

## 2017-02-25 DIAGNOSIS — D509 Iron deficiency anemia, unspecified: Secondary | ICD-10-CM | POA: Diagnosis not present

## 2017-02-25 DIAGNOSIS — D631 Anemia in chronic kidney disease: Secondary | ICD-10-CM | POA: Diagnosis not present

## 2017-02-25 DIAGNOSIS — N2581 Secondary hyperparathyroidism of renal origin: Secondary | ICD-10-CM | POA: Diagnosis not present

## 2017-02-25 DIAGNOSIS — N186 End stage renal disease: Secondary | ICD-10-CM | POA: Diagnosis not present

## 2017-02-26 ENCOUNTER — Other Ambulatory Visit: Payer: Self-pay

## 2017-02-26 ENCOUNTER — Other Ambulatory Visit: Payer: Self-pay | Admitting: Hematology

## 2017-02-26 DIAGNOSIS — C9000 Multiple myeloma not having achieved remission: Secondary | ICD-10-CM

## 2017-02-26 MED ORDER — ACYCLOVIR 400 MG PO TABS: ORAL_TABLET | ORAL | 2 refills | Status: DC

## 2017-02-27 DIAGNOSIS — D631 Anemia in chronic kidney disease: Secondary | ICD-10-CM | POA: Diagnosis not present

## 2017-02-27 DIAGNOSIS — D509 Iron deficiency anemia, unspecified: Secondary | ICD-10-CM | POA: Diagnosis not present

## 2017-02-27 DIAGNOSIS — N186 End stage renal disease: Secondary | ICD-10-CM | POA: Diagnosis not present

## 2017-02-27 DIAGNOSIS — N2581 Secondary hyperparathyroidism of renal origin: Secondary | ICD-10-CM | POA: Diagnosis not present

## 2017-03-01 DIAGNOSIS — N186 End stage renal disease: Secondary | ICD-10-CM | POA: Diagnosis not present

## 2017-03-01 DIAGNOSIS — N2581 Secondary hyperparathyroidism of renal origin: Secondary | ICD-10-CM | POA: Diagnosis not present

## 2017-03-01 DIAGNOSIS — D631 Anemia in chronic kidney disease: Secondary | ICD-10-CM | POA: Diagnosis not present

## 2017-03-01 DIAGNOSIS — D509 Iron deficiency anemia, unspecified: Secondary | ICD-10-CM | POA: Diagnosis not present

## 2017-03-04 DIAGNOSIS — N2581 Secondary hyperparathyroidism of renal origin: Secondary | ICD-10-CM | POA: Diagnosis not present

## 2017-03-04 DIAGNOSIS — D509 Iron deficiency anemia, unspecified: Secondary | ICD-10-CM | POA: Diagnosis not present

## 2017-03-04 DIAGNOSIS — D631 Anemia in chronic kidney disease: Secondary | ICD-10-CM | POA: Diagnosis not present

## 2017-03-04 DIAGNOSIS — N186 End stage renal disease: Secondary | ICD-10-CM | POA: Diagnosis not present

## 2017-03-06 DIAGNOSIS — D631 Anemia in chronic kidney disease: Secondary | ICD-10-CM | POA: Diagnosis not present

## 2017-03-06 DIAGNOSIS — D509 Iron deficiency anemia, unspecified: Secondary | ICD-10-CM | POA: Diagnosis not present

## 2017-03-06 DIAGNOSIS — N186 End stage renal disease: Secondary | ICD-10-CM | POA: Diagnosis not present

## 2017-03-06 DIAGNOSIS — N2581 Secondary hyperparathyroidism of renal origin: Secondary | ICD-10-CM | POA: Diagnosis not present

## 2017-03-07 ENCOUNTER — Other Ambulatory Visit: Payer: Medicare Other

## 2017-03-07 ENCOUNTER — Ambulatory Visit (INDEPENDENT_AMBULATORY_CARE_PROVIDER_SITE_OTHER): Payer: Medicare Other | Admitting: Neurology

## 2017-03-07 ENCOUNTER — Encounter: Payer: Self-pay | Admitting: Neurology

## 2017-03-07 VITALS — BP 160/56 | HR 105 | Ht 67.0 in | Wt 201.0 lb

## 2017-03-07 DIAGNOSIS — G629 Polyneuropathy, unspecified: Secondary | ICD-10-CM

## 2017-03-07 DIAGNOSIS — R5383 Other fatigue: Secondary | ICD-10-CM | POA: Diagnosis not present

## 2017-03-07 DIAGNOSIS — R5381 Other malaise: Secondary | ICD-10-CM | POA: Diagnosis not present

## 2017-03-07 DIAGNOSIS — E8581 Light chain (AL) amyloidosis: Secondary | ICD-10-CM

## 2017-03-07 MED ORDER — GABAPENTIN 250 MG/5ML PO SOLN: ORAL | 5 refills | Status: DC

## 2017-03-07 NOTE — Progress Notes (Signed)
NEUROLOGY CONSULTATION NOTE  Jose Garrett MRN: [id number] DOB: February 05, 1951  Referring provider: Dr. Sullivan Lone Primary care provider: Dr. [name]  Reason for consult:  neuropathy  Dear Dr [name]:  Thank you for your kind referral of Jose Garrett for consultation of the above symptoms. Although his history is well known to you, please allow me to reiterate it for the purpose of our medical record. Records and images were personally reviewed where available.   HISTORY OF PRESENT ILLNESS: This is a very pleasant 66 year old right-handed man with a history of AL amyloidosis with kidney involvement and nephrotic range proteinuria. He has had peripheral neuropathy from previous Velcade/vitamin B12 deficiency/hemodialysis which has worsened slightly, and has also had back issues. He is being referred for further evaluation of symptoms to rule out radiculopathy contributing to symptoms and consideration for EMG/NCS. He is currently on Ixazomib maintenance therapy every 2 weeks. He reports that the neuropathy has changed some since the Fall. He still gets numbness on the bottom of his feet, feeling like he is walking on a hot sidewalk when barefoot. He has been having quick stabbing pains that make him exclaim out loud, but now symptoms are a little more pervasive like he has a bruise, and any position he may be in he would "get a hit" for a second or two. He has constant pins and needles in his feet. He has had a few close calls with falls but has not fully fallen down. He continues to drive without any weakness noted. His hands are not affected. He does have back pain, with occasional shooting pain from his back down his legs, but they do not radiate to his feet. He has trouble with sleep maintenance due to pain in his feet waking him up. He has been on low dose gabapentin 250mg  qhs (takes the liquid 250mg /95mL formulation) for the past 2 years, and started amitriptyline 50mg  qhs last summer.  Taking gabapentin BID caused drowsiness. The medications do help some but "do not help enough." The pain got so bad around Thanksgiving that it started to "affect my mental health." He denies any headaches, dizziness, diplopia, dysarthria/dysphagia, neck pain, bowel/bladder dysfunction except for occasional constipation.    PAST MEDICAL HISTORY: Past Medical History:  Diagnosis Date  . Amyloidosis ([place])   . Anemia Dx 2016  . Chronic diastolic CHF (congestive heart failure), NYHA class 1 ([place])    /hospital problem list 09/08/2014  . Chronic renal disease, stage III ([place])    /hospital problem list 09/08/2014  . Diarrhea   . Dyspnea    with exertion  . Enlarged heart 2015  . Family history of adverse reaction to anesthesia    "my daughter can't take certain anesthesia agents" (09/08/2014)  . GERD (gastroesophageal reflux disease)   . History of blood transfusion 09/08/2014   "got hematoma after renal biopsy & HgB dropped"  . Hyperlipidemia Dx 2012  . Hypertension Dx 2012  . Renal disorder    "there is a spot on my kidney" (09/08/2014)  . Restless legs     PAST SURGICAL HISTORY: Past Surgical History:  Procedure Laterality Date  . AV FISTULA PLACEMENT Right 12/27/2015   Procedure: Right Arm ARTERIOVENOUS (AV) FISTULA CREATION;  Surgeon: Angelia Mould, MD;  Location: [facility];  Service: Vascular;  Laterality: Right;  . COLONOSCOPY    . IR GENERIC HISTORICAL  01/27/2016   IR FLUORO GUIDE CV LINE RIGHT 01/27/2016 Arne Cleveland, MD [facility]  .  IR GENERIC HISTORICAL  01/27/2016   IR US GUIDE VASC ACCESS RIGHT 01/27/2016 Arne Cleveland, MD [facility]  . LAPAROSCOPIC CHOLECYSTECTOMY  03/2011  . RENAL BIOPSY, PERCUTANEOUS Right 09/08/2014  . TONSILLECTOMY  ~ 1960    MEDICATIONS: Current Outpatient Medications on File Prior to Visit  Medication Sig Dispense Refill  . acetaminophen (TYLENOL) 325 MG tablet Take 325-650 mg by mouth every 6 (six) hours as needed (back pain).    [name]  acyclovir (ZOVIRAX) 400 MG tablet TAKE 1 TABLET BY MOUTH DAILY. (DOSE ADJUSTED FOR RENAL FUNCTION) 60 tablet 2  . amitriptyline (ELAVIL) 50 MG tablet Take 50 mg by mouth at bedtime.    [name] aspirin EC 81 MG tablet Take 1 tablet (81 mg total) by mouth daily. 30 tablet 11  . B Complex-C-Folic Acid (DIALYVITE 332) 0.8 MG TABS Take 0.8 mg by mouth daily.    . calcium acetate (PHOSLO) 667 MG capsule Take 1 capsule (667 mg total) by mouth 3 (three) times daily with meals. 90 capsule 0  . cetirizine (ZYRTEC) 10 MG tablet Take 10 mg by mouth daily.    . Cholecalciferol (VITAMIN D3) 3000 units TABS Take 3,000 Units by mouth daily.    . cyanocobalamin 2000 MCG tablet Take 2,500 mcg by mouth daily.    [name] Epoetin Alfa (PROCRIT IJ) Inject as directed every 14 (fourteen) days. Done at [facility] Last injection 01/19/16    . FLONASE 50 MCG/ACT nasal spray Place 1 spray into both nostrils daily as needed. (Patient taking differently: Place 1 spray into both nostrils daily as needed for allergies or rhinitis. )    . gabapentin (NEURONTIN) 250 MG/5ML solution Take 6 mLs (300 mg total) by mouth at bedtime. 470 mL 0  . ixazomib citrate (NINLARO) 3 MG capsule Take 1 capsule (3 mg total) by mouth every 14 (fourteen) days. Every other Thursday 6 capsule 2  . lanthanum (FOSRENOL) 1000 MG chewable tablet Chew 1,000 mg by mouth 2 (two) times daily with a meal.    . LORazepam (ATIVAN) 1 MG tablet Take 1 tablet (1 mg total) by mouth daily as needed for anxiety (nausea). 30 tablet 0  . multivitamin (RENA-VIT) TABS tablet Take 1 tablet by mouth daily.    . VELPHORO 500 MG chewable tablet Chew by mouth daily.  10   No current facility-administered medications on file prior to visit.     ALLERGIES: Allergies  Allergen Reactions  . No Known Allergies     FAMILY HISTORY: Family History  Problem Relation Age of Onset  . Hypertension Mother   . Diabetes Mother   . Heart disease Mother   . Skin cancer Mother   .  Hypertension Father   . Diabetes Father   . Diabetes Sister     SOCIAL HISTORY: Social History   Socioeconomic History  . Marital status: Divorced    Spouse name: Not on file  . Number of children: Not on file  . Years of education: Not on file  . Highest education level: Not on file  Social Needs  . Financial resource strain: Not on file  . Food insecurity - worry: Not on file  . Food insecurity - inability: Not on file  . Transportation needs - medical: Not on file  . Transportation needs - non-medical: Not on file  Occupational History  . Not on file  Tobacco Use  . Smoking status: Former Smoker    Packs/day: 2.00    Years: 40.00  Pack years: 80.00    Types: Cigarettes    Last attempt to quit: 03/19/2011    Years since quitting: 5.9  . Smokeless tobacco: Never Used  Substance and Sexual Activity  . Alcohol use: No    Alcohol/week: 0.0 oz  . Drug use: No    Comment: 09/08/2014 "I have used marijuana till the 1990's". Notes that he quit 15 yrs ago  . Sexual activity: Not Currently  Other Topics Concern  . 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     REVIEW OF SYSTEMS: Constitutional: No fevers, chills, or sweats, no generalized fatigue, change in appetite Eyes: No visual changes, double vision, eye pain Ear, nose and throat: No hearing loss, ear pain, nasal congestion, sore throat Cardiovascular: No chest pain, palpitations Respiratory:  No shortness of breath at rest or with exertion, wheezes GastrointestinaI: No nausea, vomiting, diarrhea, abdominal pain, fecal incontinence Genitourinary:  No dysuria, urinary retention or frequency Musculoskeletal:  No neck pain, +back pain Integumentary: No rash, pruritus, skin lesions Neurological: as above Psychiatric: No depression, insomnia, anxiety Endocrine: No palpitations,  fatigue, diaphoresis, mood swings, change in appetite, change in weight, increased thirst Hematologic/Lymphatic:  No anemia, purpura, petechiae. Allergic/Immunologic: no itchy/runny eyes, nasal congestion, recent allergic reactions, rashes  PHYSICAL EXAM: Vitals:   03/07/17 0846  BP: (!) 160/56  Pulse: (!) 105  SpO2: 98%   General: No acute distress Head:  Normocephalic/atraumatic Eyes: Fundoscopic exam shows bilateral sharp discs, no vessel changes, exudates, or hemorrhages Neck: supple, no paraspinal tenderness, full range of motion Back: No paraspinal tenderness Heart: regular rate and rhythm Lungs: Clear to auscultation bilaterally. Vascular: No carotid bruits. Skin/Extremities: No rash, no edema Neurological Exam: Mental status: alert and oriented to person, place, and time, no dysarthria or aphasia, Fund of knowledge is appropriate.  Recent and remote memory are intact.  Attention and concentration are normal.    Able to name objects and repeat phrases. Cranial nerves: CN I: not tested CN II: pupils equal, round and reactive to light, visual fields intact, fundi unremarkable. CN III, IV, VI:  full range of motion, no nystagmus, no ptosis CN V: facial sensation intact CN VII: upper and lower face symmetric CN VIII: hearing intact to finger rub CN IX, X: gag intact, uvula midline CN XI: sternocleidomastoid and trapezius muscles intact CN XII: tongue midline Bulk & Tone: normal, no fasciculations. Motor: 5/5 throughout with no pronator drift. Sensation: intact to light touch, cold, pin on both UE, decreased pin and cold on both feet bilaterally, decreased vibration sense to right knee, left ankle, intact joint position sense.  No extinction to double simultaneous stimulation.  Romberg test slight sway Deep Tendon Reflexes: +2 throughout except for absent ankle jerks bilaterally, no ankle clonus Plantar responses: downgoing bilaterally Cerebellar: no incoordination on finger to  nose testing Gait: slow and cautious, no ataxia, difficulty with tandem walk Tremor: none  IMPRESSION: This is a very pleasant 66 year old right-handed man with a history of  AL amyloidosis with kidney involvement and nephrotic range proteinuria. He has had peripheral neuropathy from previous Velcade/vitamin B12 deficiency/hemodialysis and has been reporting worsening of symptoms. There is concern that there may be a possible component of radiculopathy, he is referred for further evaluation. His exam shows evidence of a length-dependent neuropathy, no weakness noted. He will  be scheduled for an EMG/NCV of both lower extremities to further evaluate his symptoms. We discussed symptomatic treatment of neuropathic pain, he is on a low dose of gabapentin, and agrees to increase night time dose to 57mL (500mg ) qhs. This is still a low dose and may be uptitrated as tolerated. There is also room to increase amitriptyline as tolerated. We discussed other potential causes of neuropathy, B12 normal, check TSH. We discussed how neuropathy can affect balance, continue to monitor, if worsening, he will be referred for Balance therapy. He will follow-up in 4-5 months and knows to call for any changes.    Thank you for allowing me to participate in the care of this patient. Please do not hesitate to call for any questions or concerns.   Jose Garrett, M.D.  CC: Dr. [name], Dr. Adrian Blackwater

## 2017-03-07 NOTE — Patient Instructions (Addendum)
1. Bloodwork for [facility]  Your provider has requested that you have labwork completed today. Please go to [facility] ([id number]) on the second floor of this building before leaving the office today. You do not need to check in. If you are not called within 15 minutes please check with the front desk.   2. Schedule EMG/NCV of both LE with Dr. Posey Pronto 3. Increase gabapentin 250mg /52mL: Take 60mL at bedtime (500mg  total) 4. Continue amitriptyline 50mg  at bedtime 5. Continue to monitor balance, if more issues, we will do Balance Therapy 6. Follow-up in 4-5 months, call for any changes

## 2017-03-08 DIAGNOSIS — N186 End stage renal disease: Secondary | ICD-10-CM | POA: Diagnosis not present

## 2017-03-08 DIAGNOSIS — D509 Iron deficiency anemia, unspecified: Secondary | ICD-10-CM | POA: Diagnosis not present

## 2017-03-08 DIAGNOSIS — D631 Anemia in chronic kidney disease: Secondary | ICD-10-CM | POA: Diagnosis not present

## 2017-03-08 DIAGNOSIS — N2581 Secondary hyperparathyroidism of renal origin: Secondary | ICD-10-CM | POA: Diagnosis not present

## 2017-03-08 LAB — TSH: TSH: 1.37 mIU/L (ref 0.40–4.50)

## 2017-03-10 DIAGNOSIS — D631 Anemia in chronic kidney disease: Secondary | ICD-10-CM | POA: Diagnosis not present

## 2017-03-10 DIAGNOSIS — N186 End stage renal disease: Secondary | ICD-10-CM | POA: Diagnosis not present

## 2017-03-10 DIAGNOSIS — N2581 Secondary hyperparathyroidism of renal origin: Secondary | ICD-10-CM | POA: Diagnosis not present

## 2017-03-10 DIAGNOSIS — D509 Iron deficiency anemia, unspecified: Secondary | ICD-10-CM | POA: Diagnosis not present

## 2017-03-11 DIAGNOSIS — N186 End stage renal disease: Secondary | ICD-10-CM | POA: Diagnosis not present

## 2017-03-11 DIAGNOSIS — I129 Hypertensive chronic kidney disease with stage 1 through stage 4 chronic kidney disease, or unspecified chronic kidney disease: Secondary | ICD-10-CM | POA: Diagnosis not present

## 2017-03-11 DIAGNOSIS — Z992 Dependence on renal dialysis: Secondary | ICD-10-CM | POA: Diagnosis not present

## 2017-03-13 DIAGNOSIS — D631 Anemia in chronic kidney disease: Secondary | ICD-10-CM | POA: Diagnosis not present

## 2017-03-13 DIAGNOSIS — D509 Iron deficiency anemia, unspecified: Secondary | ICD-10-CM | POA: Diagnosis not present

## 2017-03-13 DIAGNOSIS — N2581 Secondary hyperparathyroidism of renal origin: Secondary | ICD-10-CM | POA: Diagnosis not present

## 2017-03-13 DIAGNOSIS — N186 End stage renal disease: Secondary | ICD-10-CM | POA: Diagnosis not present

## 2017-03-13 DIAGNOSIS — R509 Fever, unspecified: Secondary | ICD-10-CM | POA: Diagnosis not present

## 2017-03-13 NOTE — Progress Notes (Signed)
.    Hematology/Oncology clinic note  Date of service: 03/14/17  Patient Care Team: [name], MD as PCP - General (Family Medicine) [name], MD as PCP - Hematology/Oncology (Hematology and Oncology) [name], MD as Consulting Physician (Nephrology)  CHIEF COMPLAINTS: followup for AL amyloidosis.  HISTORY OF PRESENTING ILLNESS: Please see my initial consultation for details on presentation.  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 AL amyloidosis noted on kidney biopsy diagnosed 09/28/2014.    previous treatment  CyBorD x 6 cycles Evaluated for and declined Auto HSCT at [facility]. - maintenance Velcade  Ixazomib + Dexamethasone s/p 6 cycles   Current treatment  Ixazomib maintenance q2weeks   INTERVAL HISTORY   [name] is here for his scheduled 2 month follow-up for AL amyloidosis.   He notes his neuropathy has improved in the last several days overall. Currently not causing distress, but some days the sensations does not subside and will aggravate him. He gets a nerve conduction study next week. He has been taking 500 mg of Gabapentin. His kidney doctor wants him to take in the day and night. He is also on amitriptyline at night. He note his energy is not great after dialysis, lately he feels close to normal. He is not as active as before but tries to stay busy with activities he likes.  He notes that he has been spacing out the Ixazomib to 3 weeks between doses on his own. We talked about his concern with neuropathy and he would like to hold Ninlaro for now which is reasonable given he has been in hematologic remission for a while.  MEDICAL HISTORY:  Past Medical History:  Diagnosis Date  . Amyloidosis ([place])   . Anemia Dx 2016  . Chronic diastolic CHF (congestive heart failure), NYHA class 1 ([place])    /hospital problem list 09/08/2014  . Chronic renal disease, stage III ([place])     /hospital problem list 09/08/2014  . Diarrhea   . Dyspnea    with exertion  . Enlarged heart 2015  . Family history of adverse reaction to anesthesia    "my daughter can't take certain anesthesia agents" (09/08/2014)  . GERD (gastroesophageal reflux disease)   . History of blood transfusion 09/08/2014   "got hematoma after renal biopsy & HgB dropped"  . Hyperlipidemia Dx 2012  . Hypertension Dx 2012  . Renal disorder    "there is a spot on my kidney" (09/08/2014)  . Restless legs     SURGICAL HISTORY: Past Surgical History:  Procedure Laterality Date  . AV FISTULA PLACEMENT Right 12/27/2015   Procedure: Right Arm ARTERIOVENOUS (AV) FISTULA CREATION;  Surgeon: Angelia Mould, MD;  Location: [facility];  Service: Vascular;  Laterality: Right;  . COLONOSCOPY    . IR GENERIC HISTORICAL  01/27/2016   IR FLUORO GUIDE CV LINE RIGHT 01/27/2016 Arne Cleveland, MD [facility]  . IR GENERIC HISTORICAL  01/27/2016   IR US GUIDE VASC ACCESS RIGHT 01/27/2016 Arne Cleveland, MD [facility]  . LAPAROSCOPIC CHOLECYSTECTOMY  03/2011  . RENAL BIOPSY, PERCUTANEOUS Right 09/08/2014  . TONSILLECTOMY  ~ 1960    SOCIAL HISTORY: Social History   Socioeconomic History  . Marital status: Divorced    Spouse name: Not on file  . Number of children: Not on file  . Years of education: Not on file  . Highest education level: Not on file  Social Needs  . Financial resource strain: Not on  file  . Food insecurity - worry: Not on file  . Food insecurity - inability: Not on file  . Transportation needs - medical: Not on file  . Transportation needs - non-medical: Not on file  Occupational History  . Not on file  Tobacco Use  . Smoking status: Former Smoker    Packs/day: 2.00    Years: 40.00    Pack years: 80.00    Types: Cigarettes    Last attempt to quit: 03/19/2011    Years since quitting: 5.9  . Smokeless tobacco: Never Used  Substance and Sexual Activity  . Alcohol use: No     Alcohol/week: 0.0 oz  . Drug use: No    Comment: 09/08/2014 "I have used marijuana till the 1990's". Notes that he quit 15 yrs ago  . Sexual activity: Not Currently  Other Topics Concern  . 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 Lives with his daughter and her finance.  FAMILY HISTORY: Family History  Problem Relation Age of Onset  . Hypertension Mother   . Diabetes Mother   . Heart disease Mother   . Skin cancer Mother   . Hypertension Father   . Diabetes Father   . Diabetes Sister     ALLERGIES:  is allergic to no known allergies.  MEDICATIONS:  Current Outpatient Medications  Medication Sig Dispense Refill  . acetaminophen (TYLENOL) 325 MG tablet Take 325-650 mg by mouth every 6 (six) hours as needed (back pain).    [name] acyclovir (ZOVIRAX) 400 MG tablet TAKE 1 TABLET BY MOUTH DAILY. (DOSE ADJUSTED FOR RENAL FUNCTION) 60 tablet 2  . amitriptyline (ELAVIL) 50 MG tablet Take 50 mg by mouth at bedtime.    [name] aspirin EC 81 MG tablet Take 1 tablet (81 mg total) by mouth daily. 30 tablet 11  . B Complex-C-Folic Acid (DIALYVITE 121) 0.8 MG TABS Take 0.8 mg by mouth daily.    . cetirizine (ZYRTEC) 10 MG tablet Take 10 mg by mouth daily.    . Cholecalciferol (VITAMIN D3) 3000 units TABS Take 3,000 Units by mouth daily.    . cyanocobalamin 2000 MCG tablet Take 2,500 mcg by mouth daily.    [name] Epoetin Alfa (PROCRIT IJ) Inject as directed every 14 (fourteen) days. Done at [facility] Last injection 01/19/16    . FLONASE 50 MCG/ACT nasal spray Place 1 spray into both nostrils daily as needed. (Patient taking differently: Place 1 spray into both nostrils daily as needed for allergies or rhinitis. )    . gabapentin (NEURONTIN) 250 MG/5ML solution Take 10 mL at bedtime 470 mL 5   . ixazomib citrate (NINLARO) 3 MG capsule Take 1 capsule (3 mg total) by mouth every 14 (fourteen) days. Every other Thursday 6 capsule 2  . LORazepam (ATIVAN) 1 MG tablet Take 1 tablet (1 mg total) by mouth daily as needed for anxiety (nausea). 30 tablet 0  . multivitamin (RENA-VIT) TABS tablet Take 1 tablet by mouth daily.    . VELPHORO 500 MG chewable tablet Chew by mouth daily.  10  . lanthanum (FOSRENOL) 1000 MG chewable tablet Chew 1,000 mg by mouth 2 (two) times daily with a meal.     No current facility-administered medications for this visit.  REVIEW OF SYSTEMS:   A 10+ POINT REVIEW OF SYSTEMS WAS OBTAINED including neurology, dermatology, psychiatry, cardiac, respiratory, lymph, extremities, GI, GU, Musculoskeletal, constitutional, breasts, reproductive, HEENT.  All pertinent positives are noted in the HPI.  All others are negative.  PHYSICAL EXAMINATION: ECOG PERFORMANCE STATUS: 2 - Symptomatic, <50% confined to bed  Vitals:   03/14/17 1528  BP: (!) 167/66  Pulse: 95  Resp: 18  Temp: 98.4 F (36.9 C)  SpO2: 98%   Filed Weights   03/14/17 1528  Weight: 203 lb 12.8 oz (92.4 kg)    GENERAL:alert, Mild this is due to sinus congestion and sore throat and cough. SKIN: Ecchymosis on upper extremities. EYES: nl EOM, PERL OROPHARYNX: No JVD No cervical adenopathy.  NECK: no JVD LYMPH:  no palpable lymphadenopathy in the cervical, axillary or inguinal LUNGS: clear to auscultation and percussion with normal breathing effort HEART: regular rate & rhythm and 2/6 SM aortic area ABDOMEN:abdomen soft, non-tender and normal bowel sounds Musculoskeletal: B/l trace pitting pedal edema No Tenderness. No redness.  PSYCH: alert & oriented x 3 with fluent speech NEURO: no focal motor/sensory deficits  LABORATORY DATA:   . CBC Latest Ref Rng & Units 03/14/2017 01/08/2017 11/06/2016  WBC 4.0 - 10.3 10e3/uL 4.6 6.6 4.1  Hemoglobin 13.0 - 17.1 g/dL 10.8(L) 12.1(L) 10.8(L)    Hematocrit 38.4 - 49.9 % 32.5(L) 36.7(L) 33.0(L)  Platelets 140 - 400 10e3/uL 78(L) 105(L) 75(L)   . CMP Latest Ref Rng & Units 03/14/2017 01/08/2017 01/08/2017  Glucose 70 - 140 mg/dl 144(H) 102 -  BUN 7.0 - 26.0 mg/dL 30.1(H) 39.2(H) -  Creatinine 0.7 - 1.3 mg/dL 8.1(HH) 8.2(HH) -  Sodium 136 - 145 mEq/L 141 138 -  Potassium 3.5 - 5.1 mEq/L 4.1 5.1 -  Chloride 101 - 111 mmol/L - - -  CO2 22 - 29 mEq/L 33(H) 27 -  Calcium 8.4 - 10.4 mg/dL 9.5 10.6(H) -  Total Protein 6.4 - 8.3 g/dL 7.7 7.5 8.4(H)  Total Bilirubin 0.20 - 1.20 mg/dL 0.67 0.70 -  Alkaline Phos 40 - 150 U/L 69 73 -  AST 5 - 34 U/L 19 19 -  ALT 0 - 55 U/L 25 20 -       Echocardiogram 12/11/16 Impressions:  - Normal LV systolic function; moderate LVH; mild diastolic dysfunction with elevated LV filling pressure; calcified aortic valve with mild AS and mild AI; mild MR.  RADIOGRAPHIC STUDIES: I have personally reviewed the radiological images as listed and agreed with the findings in the report.   ASSESSMENT & PLAN:   Mr Jose Garrett is a very pleasant 67 yo caucasian male with   1) AL Amyloidosis noted on renal biopsy with nephrotic range proteinuria on urinalysis and some lambda free light chains on serum and urinary IFE . On diagnosis:  SPEP with small amount of M-spike 0.3  Echo showed no systolic dysfunction but grade 2 diastolic dysfunction. [facility] cardiac staging 1 as per troponin and BNP criteria. Bone marrow with no overt evidence of multiple myeloma. PET/CT scan showed no evidence of bony lesions or lymphadenopathy . Urine showed about 15 g of protein in 24 hours   Patient is status post CyBorD x 6 cycles He was then on maintenance Velcade and had progression of his AL Amyloidosis. He was then switched to Ixazomib and Dexamethasone and has completed 6 cycles without significant toxicities. Echo repeated on 12/11/2016 shows no changes in heart function compared to previous echo from earlier this  year.  Myeloma panel/SFLC WNL with no evidence of progressive disease.  Currently on maintenance Ixazomib q2weeks and tolerating it without any significant new toxicities. Stable multifactorial neuropathy.  2) Thrombocytopenia mild likely from the Ixazomib. Patient is already on renally adjusted dose of the medication. PLT 78k - will continue to monitor, this has decreased from 105K in 12/2016  Plan  -- No overt prohibitive toxicity from Ixazomib (which has already been de-escalated).  -Has grade 1-2 neuropathy from previous Velcade/B12 def ./HD which has worsened slightly?. He has had back pain issues as well and cannot r/o a component of radiculopathy. We will refer to Neurology to help evaluate and mx this further and consider EMG/NCS. -given his persistent and bothersome neuropathy he has elected to hold his Ixazomib at this time. -f/u on pending myeloma panel from today. SFLC today WNL. --myeloma panel from 12/2016 for M spike not detectable and serum free light chain ration WNL.  --continue on low-dose aspirin for VTE prophylaxis given his high risk of venous thromboembolism in the setting of nephrotic syndrome.  -continue Acyclovir for shingles prophylaxis. -Echocardiogram repeat would be next due in 6 weeks  2) multifactorial anemia - likely multifactorial -from AL amyloidosis, anemia from CKD. Hemoglobin decreased to 10.8 and is predominantly due to his significant chronic kidney disease B12 WNL -Previously deficient. B12 07/12/2016 -- 1035 Plan  -Continue ESA's prn and iron replacement as per nephrology. -continue B12 replacement SL   3) CKD stage V-related to hypertension and AL Amyloidosis. Has been started on hemodialysis through a tunnel catheter since his last clinic visit. Had AV fistula placement on 12/27/2015  Plan  -Continue hemodialysis as per nephrologist.   4) Grade 1-2 peripheral neuropathy, worsening recently. Feet tingling and numbness ? Related to AL Amyloidosis  vs Velcade vs B12 deficiency vs HD (now)vs radiculopathy from DDD  stable.  Plan  -continue Neurontin '500mg'$  po HS and adjust dose accordingly. He is also on amitriptyline. I discussed the option of switching his amitriptyline to Cymbalta.  -continue SL B12   B12 levels WNL with replacement on 01/08/17 -He has seen neurologist and will do a nerve conduction study soon.  -f/u with neurology for EMG/NCS   Hold Ixazomib for 2 months starting 03/14/17 ECHO in 6 weeks RTC with Dr [name] with labs in 2 months  All questions were answered. The patient knows to call the clinic with any problems, questions or concerns. I spent 20 minutes counseling the patient face to face. The total time spent in the appointment was 25 minutes and more than 50% was on counseling.   Sullivan Lone MD MS Hematology/Oncology Physician [facility]  (Office):       [phone] (Work cell):  [phone] (Fax):           [phone]  This document serves as a record of services personally performed by Sullivan Lone, MD. It was created on his behalf by Joslyn Devon, a trained medical scribe. The creation of this record is based on the scribe's personal observations and the provider's statements to them.   .I have reviewed the above documentation for accuracy and completeness, and I agree with the above. [name] MD MS

## 2017-03-14 ENCOUNTER — Other Ambulatory Visit (HOSPITAL_BASED_OUTPATIENT_CLINIC_OR_DEPARTMENT_OTHER): Payer: Medicare Other

## 2017-03-14 ENCOUNTER — Encounter: Payer: Self-pay | Admitting: Neurology

## 2017-03-14 ENCOUNTER — Inpatient Hospital Stay: Payer: Medicare Other | Attending: Hematology | Admitting: Hematology

## 2017-03-14 ENCOUNTER — Telehealth: Payer: Self-pay | Admitting: Hematology

## 2017-03-14 ENCOUNTER — Encounter: Payer: Self-pay | Admitting: Hematology

## 2017-03-14 VITALS — BP 167/66 | HR 95 | Temp 98.4°F | Resp 18 | Ht 67.0 in | Wt 203.8 lb

## 2017-03-14 DIAGNOSIS — N183 Chronic kidney disease, stage 3 (moderate): Secondary | ICD-10-CM | POA: Diagnosis not present

## 2017-03-14 DIAGNOSIS — E8581 Light chain (AL) amyloidosis: Secondary | ICD-10-CM

## 2017-03-14 DIAGNOSIS — E538 Deficiency of other specified B group vitamins: Secondary | ICD-10-CM

## 2017-03-14 DIAGNOSIS — D631 Anemia in chronic kidney disease: Secondary | ICD-10-CM

## 2017-03-14 DIAGNOSIS — Z992 Dependence on renal dialysis: Secondary | ICD-10-CM | POA: Diagnosis not present

## 2017-03-14 DIAGNOSIS — D696 Thrombocytopenia, unspecified: Secondary | ICD-10-CM

## 2017-03-14 DIAGNOSIS — N184 Chronic kidney disease, stage 4 (severe): Secondary | ICD-10-CM

## 2017-03-14 DIAGNOSIS — G629 Polyneuropathy, unspecified: Secondary | ICD-10-CM | POA: Insufficient documentation

## 2017-03-14 DIAGNOSIS — G620 Drug-induced polyneuropathy: Secondary | ICD-10-CM | POA: Diagnosis not present

## 2017-03-14 LAB — CBC & DIFF AND RETIC
BASO%: 0.4 % (ref 0.0–2.0)
Basophils Absolute: 0 10*3/uL (ref 0.0–0.1)
EOS ABS: 0.1 10*3/uL (ref 0.0–0.5)
EOS%: 2.4 % (ref 0.0–7.0)
HCT: 32.5 % — ABNORMAL LOW (ref 38.4–49.9)
HEMOGLOBIN: 10.8 g/dL — AB (ref 13.0–17.1)
Immature Retic Fract: 2 % — ABNORMAL LOW (ref 3.00–10.60)
LYMPH%: 17 % (ref 14.0–49.0)
MCH: 35.3 pg — AB (ref 27.2–33.4)
MCHC: 33.2 g/dL (ref 32.0–36.0)
MCV: 106.2 fL — ABNORMAL HIGH (ref 79.3–98.0)
MONO#: 0.6 10*3/uL (ref 0.1–0.9)
MONO%: 12.4 % (ref 0.0–14.0)
NEUT%: 67.8 % (ref 39.0–75.0)
NEUTROS ABS: 3.1 10*3/uL (ref 1.5–6.5)
Platelets: 78 10*3/uL — ABNORMAL LOW (ref 140–400)
RBC: 3.06 10*6/uL — AB (ref 4.20–5.82)
RDW: 14.6 % (ref 11.0–14.6)
RETIC %: 1.27 % (ref 0.80–1.80)
Retic Ct Abs: 38.86 10*3/uL (ref 34.80–93.90)
WBC: 4.6 10*3/uL (ref 4.0–10.3)
lymph#: 0.8 10*3/uL — ABNORMAL LOW (ref 0.9–3.3)

## 2017-03-14 LAB — COMPREHENSIVE METABOLIC PANEL
ALT: 25 U/L (ref 0–55)
ANION GAP: 16 meq/L — AB (ref 3–11)
AST: 19 U/L (ref 5–34)
Albumin: 4.1 g/dL (ref 3.5–5.0)
Alkaline Phosphatase: 69 U/L (ref 40–150)
BUN: 30.1 mg/dL — ABNORMAL HIGH (ref 7.0–26.0)
CALCIUM: 9.5 mg/dL (ref 8.4–10.4)
CHLORIDE: 93 meq/L — AB (ref 98–109)
CO2: 33 mEq/L — ABNORMAL HIGH (ref 22–29)
CREATININE: 8.1 mg/dL — AB (ref 0.7–1.3)
EGFR: 6 mL/min/{1.73_m2} — ABNORMAL LOW (ref >60)
Glucose: 144 mg/dl — ABNORMAL HIGH (ref 70–140)
POTASSIUM: 4.1 meq/L (ref 3.5–5.1)
Sodium: 141 mEq/L (ref 136–145)
Total Bilirubin: 0.67 mg/dL (ref 0.20–1.20)
Total Protein: 7.7 g/dL (ref 6.4–8.3)

## 2017-03-14 NOTE — Telephone Encounter (Signed)
Scheduled appt per 1/3 los - Gave patient  AVS and calender per 1/3 los -

## 2017-03-14 NOTE — Telephone Encounter (Signed)
-----   Message from Cameron Sprang, MD sent at 03/13/2017 12:27 PM EST ----- Pls let him know thyroid level is normal, thanks

## 2017-03-14 NOTE — Telephone Encounter (Signed)
Spoke with pt relaying message below.   

## 2017-03-15 DIAGNOSIS — N186 End stage renal disease: Secondary | ICD-10-CM | POA: Diagnosis not present

## 2017-03-15 DIAGNOSIS — R509 Fever, unspecified: Secondary | ICD-10-CM | POA: Diagnosis not present

## 2017-03-15 DIAGNOSIS — D631 Anemia in chronic kidney disease: Secondary | ICD-10-CM | POA: Diagnosis not present

## 2017-03-15 DIAGNOSIS — N2581 Secondary hyperparathyroidism of renal origin: Secondary | ICD-10-CM | POA: Diagnosis not present

## 2017-03-15 DIAGNOSIS — D509 Iron deficiency anemia, unspecified: Secondary | ICD-10-CM | POA: Diagnosis not present

## 2017-03-15 LAB — KAPPA/LAMBDA LIGHT CHAINS
IG KAPPA FREE LIGHT CHAIN: 127.2 mg/L — AB (ref 3.3–19.4)
IG LAMBDA FREE LIGHT CHAIN: 135.9 mg/L — AB (ref 5.7–26.3)
KAPPA/LAMBDA FLC RATIO: 0.94 (ref 0.26–1.65)

## 2017-03-18 DIAGNOSIS — D509 Iron deficiency anemia, unspecified: Secondary | ICD-10-CM | POA: Diagnosis not present

## 2017-03-18 DIAGNOSIS — N186 End stage renal disease: Secondary | ICD-10-CM | POA: Diagnosis not present

## 2017-03-18 DIAGNOSIS — R509 Fever, unspecified: Secondary | ICD-10-CM | POA: Diagnosis not present

## 2017-03-18 DIAGNOSIS — N2581 Secondary hyperparathyroidism of renal origin: Secondary | ICD-10-CM | POA: Diagnosis not present

## 2017-03-18 DIAGNOSIS — D631 Anemia in chronic kidney disease: Secondary | ICD-10-CM | POA: Diagnosis not present

## 2017-03-18 LAB — MULTIPLE MYELOMA PANEL, SERUM
ALBUMIN/GLOB SERPL: 1.2 (ref 0.7–1.7)
ALPHA 1: 0.3 g/dL (ref 0.0–0.4)
Albumin SerPl Elph-Mcnc: 3.9 g/dL (ref 2.9–4.4)
Alpha2 Glob SerPl Elph-Mcnc: 1 g/dL (ref 0.4–1.0)
B-Globulin SerPl Elph-Mcnc: 1 g/dL (ref 0.7–1.3)
Gamma Glob SerPl Elph-Mcnc: 1.1 g/dL (ref 0.4–1.8)
Globulin, Total: 3.4 g/dL (ref 2.2–3.9)
IGA/IMMUNOGLOBULIN A, SERUM: 99 mg/dL (ref 61–437)
IGG (IMMUNOGLOBIN G), SERUM: 950 mg/dL (ref 700–1600)
IGM (IMMUNOGLOBIN M), SRM: 152 mg/dL (ref 20–172)
TOTAL PROTEIN: 7.3 g/dL (ref 6.0–8.5)

## 2017-03-19 ENCOUNTER — Ambulatory Visit (INDEPENDENT_AMBULATORY_CARE_PROVIDER_SITE_OTHER): Payer: Medicare Other

## 2017-03-19 ENCOUNTER — Encounter (HOSPITAL_COMMUNITY): Payer: Self-pay | Admitting: Family Medicine

## 2017-03-19 ENCOUNTER — Other Ambulatory Visit (HOSPITAL_COMMUNITY): Payer: Medicare Other

## 2017-03-19 ENCOUNTER — Ambulatory Visit (HOSPITAL_COMMUNITY)
Admission: EM | Admit: 2017-03-19 | Discharge: 2017-03-19 | Disposition: A | Discharge status: Home / Self Care | Payer: Medicare Other | Attending: Internal Medicine | Admitting: Internal Medicine

## 2017-03-19 DIAGNOSIS — J00 Acute nasopharyngitis [common cold]: Secondary | ICD-10-CM | POA: Diagnosis not present

## 2017-03-19 DIAGNOSIS — R05 Cough: Secondary | ICD-10-CM | POA: Diagnosis not present

## 2017-03-19 MED ORDER — IPRATROPIUM BROMIDE 0.06 % NA SOLN: 2.0000 | Freq: Four times a day (QID) | NASAL | 0 refills | Status: DC

## 2017-03-19 MED ORDER — BENZONATATE 100 MG PO CAPS: 100.0000 mg | ORAL_CAPSULE | Freq: Three times a day (TID) | ORAL | 0 refills | Status: DC

## 2017-03-19 NOTE — Discharge Instructions (Signed)
Chest x-ray negative for pneumonia or acute heart failure.  Continue Zyrtec and Flonase.  Start Tessalon for cough.  I will add Atrovent nasal spray to help with nasal congestion. You can use over the counter nasal saline rinse such as neti pot for nasal congestion. Keep hydrated, your urine should be clear to pale yellow in color. Tylenol/motrin for fever and pain. Monitor for any worsening of symptoms, chest pain, shortness of breath, wheezing, swelling of the throat, follow up for reevaluation.

## 2017-03-19 NOTE — ED Triage Notes (Signed)
Pt here for nasal drainage with congestion and dry productive cough. sts since Saturday. Reports hx of allergies. Using zyrtec and flonase

## 2017-03-19 NOTE — ED Provider Notes (Signed)
Jose Garrett    CSN: [id number] Arrival date & time: 03/19/17  1000     History   Chief Complaint Chief Complaint  Patient presents with  . Nasal Congestion    HPI DAMEON SOLTIS is a 67 y.o. male.   67 year old male with history of CHF, CKD (M,W,F), HLD, HTN, comes in for 4-day history of URI symptoms.  He has had nasal drainage with congestion, dry and productive cough, mild sore throat associated with the cough. Denies fever, chills, night sweats. Denies chest pain, shortness of breath, leg swelling that is worse than baseline. States using more pillows due to nasal congestion. otc zyrtec and flonase with some relief. Former smoker, 2ppd for 30 years, quit 5 years ago.   Discussed patient's vitals, patient states did not get full dialysis treatment yesterday due to feeling unwell.  States blood pressure will be much better after dialysis tomorrow.  He currently denies chest pain, shortness of breath, headache, weakness, dizziness.      Past Medical History:  Diagnosis Date  . Amyloidosis ([place])   . Anemia Dx 2016  . Chronic diastolic CHF (congestive heart failure), NYHA class 1 ([place])    /hospital problem list 09/08/2014  . Chronic renal disease, stage III ([place])    /hospital problem list 09/08/2014  . Diarrhea   . Dyspnea    with exertion  . Enlarged heart 2015  . Family history of adverse reaction to anesthesia    "my daughter can't take certain anesthesia agents" (09/08/2014)  . GERD (gastroesophageal reflux disease)   . History of blood transfusion 09/08/2014   "got hematoma after renal biopsy & HgB dropped"  . Hyperlipidemia Dx 2012  . Hypertension Dx 2012  . Renal disorder    "there is a spot on my kidney" (09/08/2014)  . Restless legs     Patient Active Problem List   Diagnosis Date Noted  . Neuropathy 03/14/2017  . Fever   . CKD (chronic kidney disease) stage 5, GFR less than 15 ml/min (HCC)   . ESRD (end stage renal disease) ([place])   . Acute renal  failure superimposed on chronic kidney disease ([place])   . Hypervolemia   . Acute on chronic diastolic CHF (congestive heart failure) ([place]) 01/23/2016  . Fluid overload, unspecified 01/23/2016  . Acute pulmonary edema ([place]) 01/23/2016  . Skin tear of right forearm without complication 82/80/0349  . Iron deficiency anemia 11/22/2015  . Mild aortic stenosis 08/22/2015  . Chronic kidney disease with dialysis modality undecided, stage 5 ([place]): Progressive 08/01/2015  . B12 deficiency 07/19/2015  . Seasonal allergies 06/30/2015  . Back pain 03/18/2015  . Hip pain 02/18/2015  . AL amyloidosis ([place]) 09/25/2014  . Anemia of chronic disease 09/25/2014  . Abnormal bruising 09/25/2014  . Renal hematoma, right 09/08/2014  . Chronic diastolic heart failure, NYHA class 1 ([place]) 09/08/2014  . Hematuria   . Proteinuria   . Vitamin D deficiency 01/21/2014  . Hypertriglyceridemia 01/21/2014  . Essential hypertension, benign 11/06/2013  . Dependent edema 11/06/2013  . DOE (dyspnea on exertion) 11/06/2013  . Other malaise and fatigue 11/06/2013    Past Surgical History:  Procedure Laterality Date  . AV FISTULA PLACEMENT Right 12/27/2015   Procedure: Right Arm ARTERIOVENOUS (AV) FISTULA CREATION;  Surgeon: Angelia Mould, MD;  Location: [facility];  Service: Vascular;  Laterality: Right;  . COLONOSCOPY    . IR GENERIC HISTORICAL  01/27/2016   IR FLUORO GUIDE CV LINE RIGHT 01/27/2016 Arne Cleveland,  MD [facility]  . IR GENERIC HISTORICAL  01/27/2016   IR US GUIDE VASC ACCESS RIGHT 01/27/2016 Arne Cleveland, MD [facility]  . LAPAROSCOPIC CHOLECYSTECTOMY  03/2011  . RENAL BIOPSY, PERCUTANEOUS Right 09/08/2014  . TONSILLECTOMY  ~ 1960       Home Medications    Prior to Admission medications   Medication Sig Start Date End Date Taking? Authorizing Provider  acetaminophen (TYLENOL) 325 MG tablet Take 325-650 mg by mouth every 6 (six) hours as needed (back pain).    [provider]    acyclovir (ZOVIRAX) 400 MG tablet TAKE 1 TABLET BY MOUTH DAILY. (DOSE ADJUSTED FOR RENAL FUNCTION) 02/26/17   [name], MD  amitriptyline (ELAVIL) 50 MG tablet Take 50 mg by mouth at bedtime.    [provider]  aspirin EC 81 MG tablet Take 1 tablet (81 mg total) by mouth daily. 01/14/15   [name], MD  B Complex-C-Folic Acid (DIALYVITE 892) 0.8 MG TABS Take 0.8 mg by mouth daily.    [provider]  benzonatate (TESSALON) 100 MG capsule Take 1 capsule (100 mg total) by mouth every 8 (eight) hours. 03/19/17   Tasia Catchings, Katira Dumais V, PA-C  cetirizine (ZYRTEC) 10 MG tablet Take 10 mg by mouth daily.    [provider]  Cholecalciferol (VITAMIN D3) 3000 units TABS Take 3,000 Units by mouth daily.    [provider]  cyanocobalamin 2000 MCG tablet Take 2,500 mcg by mouth daily.    [provider]  Epoetin Alfa (PROCRIT IJ) Inject as directed every 14 (fourteen) days. Done at [facility] Last injection 01/19/16    [provider]  FLONASE 50 MCG/ACT nasal spray Place 1 spray into both nostrils daily as needed. Patient taking differently: Place 1 spray into both nostrils daily as needed for allergies or rhinitis.  12/27/15   Alvia Grove, PA-C  gabapentin (NEURONTIN) 250 MG/5ML solution Take 10 mL at bedtime 03/07/17   Cameron Sprang, MD  ipratropium (ATROVENT) 0.06 % nasal spray Place 2 sprays into both nostrils 4 (four) times daily. 03/19/17   Tasia Catchings, Abdulraheem Pineo V, PA-C  ixazomib citrate (NINLARO) 3 MG capsule Take 1 capsule (3 mg total) by mouth every 14 (fourteen) days. Every other Thursday 06/21/16   [name], MD  lanthanum (FOSRENOL) 1000 MG chewable tablet Chew 1,000 mg by mouth 2 (two) times daily with a meal.    [provider]  LORazepam (ATIVAN) 1 MG tablet Take 1 tablet (1 mg total) by mouth daily as needed for anxiety (nausea). 01/08/17   [name], MD  multivitamin (RENA-VIT) TABS tablet Take 1  tablet by mouth daily.    [provider]  VELPHORO 500 MG chewable tablet Chew by mouth daily. 12/28/16   [provider]    Family History Family History  Problem Relation Age of Onset  . Hypertension Mother   . Diabetes Mother   . Heart disease Mother   . Skin cancer Mother   . Hypertension Father   . Diabetes Father   . Diabetes Sister     Social History Social History   Tobacco Use  . Smoking status: Former Smoker    Packs/day: 2.00    Years: 40.00    Pack years: 80.00    Types: Cigarettes    Last attempt to quit: 03/19/2011    Years since quitting: 6.0  . Smokeless tobacco: Never Used  Substance Use Topics  . Alcohol use:  No    Alcohol/week: 0.0 oz  . Drug use: No    Comment: 09/08/2014 "I have used marijuana till the 1990's". Notes that he quit 15 yrs ago     Allergies   No known allergies   Review of Systems Review of Systems  Reason unable to perform ROS: See HPI as above.     Physical Exam Triage Vital Signs ED Triage Vitals [03/19/17 1012]  Enc Vitals Group     BP (!) 193/73     Pulse Rate 100     Resp 18     Temp 97.9 F (36.6 C)     Temp src      SpO2 99 %     Weight      Height      Head Circumference      Peak Flow      Pain Score      Pain Loc      Pain Edu?      Excl. in [place]?    No data found.  Updated Vital Signs BP (!) 193/73   Pulse 100   Temp 97.9 F (36.6 C)   Resp 18   SpO2 99%   Physical Exam  Constitutional: He is oriented to person, place, and time. He appears well-developed and well-nourished. No distress.  HENT:  Head: Normocephalic and atraumatic.  Right Ear: Tympanic membrane, external ear and ear canal normal. Tympanic membrane is not erythematous and not bulging.  Nose: Mucosal edema and rhinorrhea present. Right sinus exhibits no maxillary sinus tenderness and no frontal sinus tenderness. Left sinus exhibits no maxillary sinus tenderness and no frontal sinus tenderness.  Mouth/Throat:  Uvula is midline, oropharynx is clear and moist and mucous membranes are normal.  Left ear with cerumen impaction, TM not visible.  Eyes: Conjunctivae are normal. Pupils are equal, round, and reactive to light.  Neck: Normal range of motion. Neck supple.  Cardiovascular: Normal rate, regular rhythm and normal heart sounds. Exam reveals no gallop and no friction rub.  No murmur heard. Pulmonary/Chest: Effort normal.  Crackles heard at the left lower lobe that does not resolve with coughing.  Musculoskeletal:  Mild bilateral swelling.  No erythema, increased warmth, tenderness to palpation.  Lymphadenopathy:    He has no cervical adenopathy.  Neurological: He is alert and oriented to person, place, and time.  Skin: Skin is warm and dry.  Psychiatric: He has a normal mood and affect. His behavior is normal. Judgment normal.     UC Treatments / Results  Labs (all labs ordered are listed, but only abnormal results are displayed) Labs Reviewed - No data to display  EKG  EKG Interpretation None       Radiology Dg Chest 2 View  Result Date: 03/19/2017 CLINICAL DATA:  cough, left lower field crackles, history of CHF EXAM: CHEST  2 VIEW COMPARISON:  01/30/2016. FINDINGS: Interval removal of right IJ line. No pleural effusion or pneumothorax. Costophrenic angles are not completely imaged. Biapical pleural thickening consistent scarring. Mediastinum hilar structures normal. Stable cardiomegaly. IMPRESSION: 1.  Interim removal of right IJ line. 2.  Cardiomegaly.  No evidence of overt CHF. Electronically Signed   By: Marcello Moores  Register   On: 03/19/2017 11:08    Procedures Procedures (including critical care time)  Medications Ordered in UC Medications - No data to display   Initial Impression / Assessment and Plan / UC Course  I have reviewed the triage vital signs and the nursing notes.  Pertinent  labs & imaging results that were available during my care of the patient were reviewed by  me and considered in my medical decision making (see chart for details).    Chest x-ray negative for pneumonia, no overt CHF.  Will treat for viral URI for now.  Patient to continue Flonase and Zyrtec.  Will add on Tessalon and Atrovent nasal spray.  Patient to follow-up tomorrow for dialysis, and to recheck blood pressure.  Strict return precautions given.  Patient expresses understanding and agrees to plan.  Final Clinical Impressions(s) / UC Diagnoses   Final diagnoses:  Acute nasopharyngitis    ED Discharge Orders        Ordered    benzonatate (TESSALON) 100 MG capsule  Every 8 hours     03/19/17 1117    ipratropium (ATROVENT) 0.06 % nasal spray  4 times daily     03/19/17 1117        Ok Edwards, PA-C 03/19/17 1122

## 2017-03-20 DIAGNOSIS — D509 Iron deficiency anemia, unspecified: Secondary | ICD-10-CM | POA: Diagnosis not present

## 2017-03-20 DIAGNOSIS — N2581 Secondary hyperparathyroidism of renal origin: Secondary | ICD-10-CM | POA: Diagnosis not present

## 2017-03-20 DIAGNOSIS — R509 Fever, unspecified: Secondary | ICD-10-CM | POA: Diagnosis not present

## 2017-03-20 DIAGNOSIS — D631 Anemia in chronic kidney disease: Secondary | ICD-10-CM | POA: Diagnosis not present

## 2017-03-20 DIAGNOSIS — N186 End stage renal disease: Secondary | ICD-10-CM | POA: Diagnosis not present

## 2017-03-21 ENCOUNTER — Ambulatory Visit (INDEPENDENT_AMBULATORY_CARE_PROVIDER_SITE_OTHER): Payer: Medicare Other | Admitting: Neurology

## 2017-03-21 DIAGNOSIS — G629 Polyneuropathy, unspecified: Secondary | ICD-10-CM | POA: Diagnosis not present

## 2017-03-21 DIAGNOSIS — M5417 Radiculopathy, lumbosacral region: Secondary | ICD-10-CM

## 2017-03-21 NOTE — Procedures (Signed)
[facility]  [address], [address]  [place], [place] [address] Tel: [phone] Fax:  [phone] Test Date:  03/21/2017  Patient: Jose Garrett DOB: 08/01/1950 Physician: Narda Amber, DO  Sex: Male Height: 5\' 7"  Ref Phys: Ellouise Newer, MD  ID#: [id number] Temp: 34.4C Technician:    Patient Complaints: This is a 67 year old man with history of amyloidosis referred for evaluation of worsening neuropathy.  NCV & EMG Findings: Extensive electrodiagnostic testing of the right lower extremity and additional studies of the left shows:  1. Bilateral sural and superficial peroneal sensory responses are absent. 2. Bilateral peroneal motor responses are absent at the extensor digitorum brevis, and normal at the tibialis anterior. Bilateral tibial motor responses are absent. 3. Bilateral tibial H reflex studies are absent. 4. Chronic motor axon loss changes are seen affecting nearly all the tested muscles of the lower extremity and conform to a gradient pattern, sparing the gluteus medius muscles.  There is no evidence of accompanied active denervation.  Impression: 1. There is a chronic and symmetric axonal sensorimotor polyneuropathy affecting the lower extremities, which was previously not present on his study dated 10/12/2014. These findings are severe in degree electrically.  2. There is evidence of a superimposed L3-4 radiculopathy affecting bilateral lower extremities, and worse on the right.   ___________________________ Narda Amber, DO    Nerve Conduction Studies Anti Sensory Summary Table   Site NR Peak (ms) Norm Peak (ms) P-T Amp (V) Norm P-T Amp  Left Sup Peroneal Anti Sensory (Ant Lat Mall)  34.4C  12 cm NR  <4.6  >3  Right Sup Peroneal Anti Sensory (Ant Lat Mall)  34.4C  12 cm NR  <4.6  >3  Left Sural Anti Sensory (Lat Mall)  34.4C  Calf NR  <4.6  >3  Right Sural Anti Sensory (Lat Mall)  34.4C  Calf NR  <4.6  >3   Motor Summary Table   Site NR  Onset (ms) Norm Onset (ms) O-P Amp (mV) Norm O-P Amp Site1 Site2 Delta-0 (ms) Dist (cm) Vel (m/s) Norm Vel (m/s)  Left Peroneal Motor (Ext Dig Brev)  34.4C  Ankle NR  <6.0  >2.5 B Fib Ankle  0.0  >40  B Fib NR     Poplt B Fib  0.0  >40  Poplt NR            Right Peroneal Motor (Ext Dig Brev)  34.4C  Ankle NR  <6.0  >2.5 B Fib Ankle  0.0  >40  B Fib NR     Poplt B Fib  0.0  >40  Poplt NR            Left Peroneal TA Motor (Tib Ant)  34.4C  Fib Head    3.4 <4.5 5.4 >3 Poplit Fib Head 1.5 8.0 53 >40  Poplit    4.9  5.3         Right Peroneal TA Motor (Tib Ant)  34.4C  Fib Head    3.8 <4.5 5.9 >3 Poplit Fib Head 1.4 8.0 57 >40  Poplit    5.2  5.8         Left Tibial Motor (Abd Hall Brev)  34.4C  Ankle NR  <6.0  >4 Knee Ankle  0.0  >40  Knee NR            Right Tibial Motor (Abd Hall Brev)  34.4C  Ankle NR  <6.0  >4 Knee Ankle  0.0  >40  Knee  NR             H Reflex Studies   NR H-Lat (ms) Lat Norm (ms) L-R H-Lat (ms)  Left Tibial (Gastroc)  34.4C  NR  <35   Right Tibial (Gastroc)  34.4C  NR  <35    EMG   Side Muscle Ins Act Fibs Psw Fasc Number Recrt Dur Dur. Amp Amp. Poly Poly. Comment  Left AntTibialis Nml Nml Nml Nml 2- Rapid Some 1+ Some 1+ Some 1+ N/A  Left BicepsFemS Nml Nml Nml Nml 1- Rapid Few 1+ Few 1+ Nml Nml N/A  Left Flex Dig Long Nml Nml Nml Nml 3- Rapid Many 1+ Many 1+ Nml Nml N/A  Left Gastroc Nml Nml Nml Nml 2- Rapid Few 1+ Few 1+ Nml Nml N/A  Left GluteusMed Nml Nml Nml Nml Nml Nml Nml Nml Nml Nml Nml Nml N/A  Left RectFemoris Nml Nml Nml Nml 2- Rapid Few 1+ Few 1+ Nml Nml N/A  Right AdductorLong Nml Nml Nml Nml 2- Rapid Some 1+ Some 1+ Nml Nml N/A  Right AntTibialis Nml Nml Nml Nml 2- Rapid Many 1+ Many 1+ Many 1+ N/A  Right BicepsFemS Nml Nml Nml Nml 1- Rapid Few 1+ Few 1+ Nml Nml N/A  Right Flex Dig Long Nml Nml Nml Nml 2- Rapid Many 1+ Many 1+ Nml Nml N/A  Right Gastroc Nml Nml Nml Nml 2- Rapid Few 1+ Few 1+ Nml Nml N/A  Right GluteusMed Nml Nml Nml  Nml Nml Nml Nml Nml Nml Nml Nml Nml N/A  Right RectFemoris Nml Nml Nml Nml 2- Rapid Many 1+ Many 1+ Many 1+ N/A      Waveforms:

## 2017-03-22 DIAGNOSIS — D631 Anemia in chronic kidney disease: Secondary | ICD-10-CM | POA: Diagnosis not present

## 2017-03-22 DIAGNOSIS — N186 End stage renal disease: Secondary | ICD-10-CM | POA: Diagnosis not present

## 2017-03-22 DIAGNOSIS — D509 Iron deficiency anemia, unspecified: Secondary | ICD-10-CM | POA: Diagnosis not present

## 2017-03-22 DIAGNOSIS — R509 Fever, unspecified: Secondary | ICD-10-CM | POA: Diagnosis not present

## 2017-03-22 DIAGNOSIS — N2581 Secondary hyperparathyroidism of renal origin: Secondary | ICD-10-CM | POA: Diagnosis not present

## 2017-03-25 DIAGNOSIS — N186 End stage renal disease: Secondary | ICD-10-CM | POA: Diagnosis not present

## 2017-03-25 DIAGNOSIS — R509 Fever, unspecified: Secondary | ICD-10-CM | POA: Diagnosis not present

## 2017-03-25 DIAGNOSIS — D631 Anemia in chronic kidney disease: Secondary | ICD-10-CM | POA: Diagnosis not present

## 2017-03-25 DIAGNOSIS — D509 Iron deficiency anemia, unspecified: Secondary | ICD-10-CM | POA: Diagnosis not present

## 2017-03-25 DIAGNOSIS — N2581 Secondary hyperparathyroidism of renal origin: Secondary | ICD-10-CM | POA: Diagnosis not present

## 2017-03-27 ENCOUNTER — Telehealth: Payer: Self-pay

## 2017-03-27 DIAGNOSIS — R509 Fever, unspecified: Secondary | ICD-10-CM | POA: Diagnosis not present

## 2017-03-27 DIAGNOSIS — D509 Iron deficiency anemia, unspecified: Secondary | ICD-10-CM | POA: Diagnosis not present

## 2017-03-27 DIAGNOSIS — N2581 Secondary hyperparathyroidism of renal origin: Secondary | ICD-10-CM | POA: Diagnosis not present

## 2017-03-27 DIAGNOSIS — D631 Anemia in chronic kidney disease: Secondary | ICD-10-CM | POA: Diagnosis not present

## 2017-03-27 DIAGNOSIS — N186 End stage renal disease: Secondary | ICD-10-CM | POA: Diagnosis not present

## 2017-03-27 NOTE — Telephone Encounter (Signed)
Message below relayed via [place].

## 2017-03-27 NOTE — Telephone Encounter (Signed)
-----   Message from Cameron Sprang, MD sent at 03/26/2017  4:35 PM EST ----- Pls let patient know the nerve test did confirm neuropathy, but also showed evidence of pinched nerves in his back, worse on the right leg. Physical therapy may help, he declined the last time, does he want to try it? Thanks

## 2017-03-29 DIAGNOSIS — D631 Anemia in chronic kidney disease: Secondary | ICD-10-CM | POA: Diagnosis not present

## 2017-03-29 DIAGNOSIS — D509 Iron deficiency anemia, unspecified: Secondary | ICD-10-CM | POA: Diagnosis not present

## 2017-03-29 DIAGNOSIS — N2581 Secondary hyperparathyroidism of renal origin: Secondary | ICD-10-CM | POA: Diagnosis not present

## 2017-03-29 DIAGNOSIS — N186 End stage renal disease: Secondary | ICD-10-CM | POA: Diagnosis not present

## 2017-03-29 DIAGNOSIS — R509 Fever, unspecified: Secondary | ICD-10-CM | POA: Diagnosis not present

## 2017-04-01 DIAGNOSIS — R509 Fever, unspecified: Secondary | ICD-10-CM | POA: Diagnosis not present

## 2017-04-01 DIAGNOSIS — D509 Iron deficiency anemia, unspecified: Secondary | ICD-10-CM | POA: Diagnosis not present

## 2017-04-01 DIAGNOSIS — D631 Anemia in chronic kidney disease: Secondary | ICD-10-CM | POA: Diagnosis not present

## 2017-04-01 DIAGNOSIS — N2581 Secondary hyperparathyroidism of renal origin: Secondary | ICD-10-CM | POA: Diagnosis not present

## 2017-04-01 DIAGNOSIS — N186 End stage renal disease: Secondary | ICD-10-CM | POA: Diagnosis not present

## 2017-04-02 ENCOUNTER — Ambulatory Visit: Payer: Medicare Other | Admitting: Neurology

## 2017-04-03 DIAGNOSIS — D631 Anemia in chronic kidney disease: Secondary | ICD-10-CM | POA: Diagnosis not present

## 2017-04-03 DIAGNOSIS — R509 Fever, unspecified: Secondary | ICD-10-CM | POA: Diagnosis not present

## 2017-04-03 DIAGNOSIS — N186 End stage renal disease: Secondary | ICD-10-CM | POA: Diagnosis not present

## 2017-04-03 DIAGNOSIS — D509 Iron deficiency anemia, unspecified: Secondary | ICD-10-CM | POA: Diagnosis not present

## 2017-04-03 DIAGNOSIS — N2581 Secondary hyperparathyroidism of renal origin: Secondary | ICD-10-CM | POA: Diagnosis not present

## 2017-04-05 DIAGNOSIS — D631 Anemia in chronic kidney disease: Secondary | ICD-10-CM | POA: Diagnosis not present

## 2017-04-05 DIAGNOSIS — R509 Fever, unspecified: Secondary | ICD-10-CM | POA: Diagnosis not present

## 2017-04-05 DIAGNOSIS — D509 Iron deficiency anemia, unspecified: Secondary | ICD-10-CM | POA: Diagnosis not present

## 2017-04-05 DIAGNOSIS — N186 End stage renal disease: Secondary | ICD-10-CM | POA: Diagnosis not present

## 2017-04-05 DIAGNOSIS — N2581 Secondary hyperparathyroidism of renal origin: Secondary | ICD-10-CM | POA: Diagnosis not present

## 2017-04-08 DIAGNOSIS — N186 End stage renal disease: Secondary | ICD-10-CM | POA: Diagnosis not present

## 2017-04-08 DIAGNOSIS — D509 Iron deficiency anemia, unspecified: Secondary | ICD-10-CM | POA: Diagnosis not present

## 2017-04-08 DIAGNOSIS — R509 Fever, unspecified: Secondary | ICD-10-CM | POA: Diagnosis not present

## 2017-04-08 DIAGNOSIS — N2581 Secondary hyperparathyroidism of renal origin: Secondary | ICD-10-CM | POA: Diagnosis not present

## 2017-04-08 DIAGNOSIS — D631 Anemia in chronic kidney disease: Secondary | ICD-10-CM | POA: Diagnosis not present

## 2017-04-10 DIAGNOSIS — N2581 Secondary hyperparathyroidism of renal origin: Secondary | ICD-10-CM | POA: Diagnosis not present

## 2017-04-10 DIAGNOSIS — N186 End stage renal disease: Secondary | ICD-10-CM | POA: Diagnosis not present

## 2017-04-10 DIAGNOSIS — D631 Anemia in chronic kidney disease: Secondary | ICD-10-CM | POA: Diagnosis not present

## 2017-04-10 DIAGNOSIS — R509 Fever, unspecified: Secondary | ICD-10-CM | POA: Diagnosis not present

## 2017-04-10 DIAGNOSIS — D509 Iron deficiency anemia, unspecified: Secondary | ICD-10-CM | POA: Diagnosis not present

## 2017-04-11 DIAGNOSIS — N186 End stage renal disease: Secondary | ICD-10-CM | POA: Diagnosis not present

## 2017-04-11 DIAGNOSIS — I129 Hypertensive chronic kidney disease with stage 1 through stage 4 chronic kidney disease, or unspecified chronic kidney disease: Secondary | ICD-10-CM | POA: Diagnosis not present

## 2017-04-11 DIAGNOSIS — Z992 Dependence on renal dialysis: Secondary | ICD-10-CM | POA: Diagnosis not present

## 2017-04-12 DIAGNOSIS — I129 Hypertensive chronic kidney disease with stage 1 through stage 4 chronic kidney disease, or unspecified chronic kidney disease: Secondary | ICD-10-CM | POA: Diagnosis not present

## 2017-04-12 DIAGNOSIS — N2581 Secondary hyperparathyroidism of renal origin: Secondary | ICD-10-CM | POA: Diagnosis not present

## 2017-04-12 DIAGNOSIS — D631 Anemia in chronic kidney disease: Secondary | ICD-10-CM | POA: Diagnosis not present

## 2017-04-12 DIAGNOSIS — N186 End stage renal disease: Secondary | ICD-10-CM | POA: Diagnosis not present

## 2017-04-12 DIAGNOSIS — Z992 Dependence on renal dialysis: Secondary | ICD-10-CM | POA: Diagnosis not present

## 2017-04-12 DIAGNOSIS — D509 Iron deficiency anemia, unspecified: Secondary | ICD-10-CM | POA: Diagnosis not present

## 2017-04-15 DIAGNOSIS — N186 End stage renal disease: Secondary | ICD-10-CM | POA: Diagnosis not present

## 2017-04-15 DIAGNOSIS — D631 Anemia in chronic kidney disease: Secondary | ICD-10-CM | POA: Diagnosis not present

## 2017-04-15 DIAGNOSIS — N2581 Secondary hyperparathyroidism of renal origin: Secondary | ICD-10-CM | POA: Diagnosis not present

## 2017-04-15 DIAGNOSIS — D509 Iron deficiency anemia, unspecified: Secondary | ICD-10-CM | POA: Diagnosis not present

## 2017-04-17 DIAGNOSIS — N186 End stage renal disease: Secondary | ICD-10-CM | POA: Diagnosis not present

## 2017-04-17 DIAGNOSIS — N2581 Secondary hyperparathyroidism of renal origin: Secondary | ICD-10-CM | POA: Diagnosis not present

## 2017-04-17 DIAGNOSIS — D509 Iron deficiency anemia, unspecified: Secondary | ICD-10-CM | POA: Diagnosis not present

## 2017-04-17 DIAGNOSIS — D631 Anemia in chronic kidney disease: Secondary | ICD-10-CM | POA: Diagnosis not present

## 2017-04-19 DIAGNOSIS — N186 End stage renal disease: Secondary | ICD-10-CM | POA: Diagnosis not present

## 2017-04-19 DIAGNOSIS — D509 Iron deficiency anemia, unspecified: Secondary | ICD-10-CM | POA: Diagnosis not present

## 2017-04-19 DIAGNOSIS — D631 Anemia in chronic kidney disease: Secondary | ICD-10-CM | POA: Diagnosis not present

## 2017-04-19 DIAGNOSIS — N2581 Secondary hyperparathyroidism of renal origin: Secondary | ICD-10-CM | POA: Diagnosis not present

## 2017-04-22 DIAGNOSIS — D631 Anemia in chronic kidney disease: Secondary | ICD-10-CM | POA: Diagnosis not present

## 2017-04-22 DIAGNOSIS — D509 Iron deficiency anemia, unspecified: Secondary | ICD-10-CM | POA: Diagnosis not present

## 2017-04-22 DIAGNOSIS — N186 End stage renal disease: Secondary | ICD-10-CM | POA: Diagnosis not present

## 2017-04-22 DIAGNOSIS — N2581 Secondary hyperparathyroidism of renal origin: Secondary | ICD-10-CM | POA: Diagnosis not present

## 2017-04-24 DIAGNOSIS — D509 Iron deficiency anemia, unspecified: Secondary | ICD-10-CM | POA: Diagnosis not present

## 2017-04-24 DIAGNOSIS — N186 End stage renal disease: Secondary | ICD-10-CM | POA: Diagnosis not present

## 2017-04-24 DIAGNOSIS — N2581 Secondary hyperparathyroidism of renal origin: Secondary | ICD-10-CM | POA: Diagnosis not present

## 2017-04-24 DIAGNOSIS — D631 Anemia in chronic kidney disease: Secondary | ICD-10-CM | POA: Diagnosis not present

## 2017-04-26 DIAGNOSIS — D509 Iron deficiency anemia, unspecified: Secondary | ICD-10-CM | POA: Diagnosis not present

## 2017-04-26 DIAGNOSIS — N186 End stage renal disease: Secondary | ICD-10-CM | POA: Diagnosis not present

## 2017-04-26 DIAGNOSIS — N2581 Secondary hyperparathyroidism of renal origin: Secondary | ICD-10-CM | POA: Diagnosis not present

## 2017-04-26 DIAGNOSIS — D631 Anemia in chronic kidney disease: Secondary | ICD-10-CM | POA: Diagnosis not present

## 2017-04-29 DIAGNOSIS — N186 End stage renal disease: Secondary | ICD-10-CM | POA: Diagnosis not present

## 2017-04-29 DIAGNOSIS — D631 Anemia in chronic kidney disease: Secondary | ICD-10-CM | POA: Diagnosis not present

## 2017-04-29 DIAGNOSIS — D509 Iron deficiency anemia, unspecified: Secondary | ICD-10-CM | POA: Diagnosis not present

## 2017-04-29 DIAGNOSIS — N2581 Secondary hyperparathyroidism of renal origin: Secondary | ICD-10-CM | POA: Diagnosis not present

## 2017-05-01 DIAGNOSIS — N2581 Secondary hyperparathyroidism of renal origin: Secondary | ICD-10-CM | POA: Diagnosis not present

## 2017-05-01 DIAGNOSIS — N186 End stage renal disease: Secondary | ICD-10-CM | POA: Diagnosis not present

## 2017-05-01 DIAGNOSIS — D509 Iron deficiency anemia, unspecified: Secondary | ICD-10-CM | POA: Diagnosis not present

## 2017-05-01 DIAGNOSIS — D631 Anemia in chronic kidney disease: Secondary | ICD-10-CM | POA: Diagnosis not present

## 2017-05-02 ENCOUNTER — Ambulatory Visit (HOSPITAL_COMMUNITY)
Admission: RE | Admit: 2017-05-02 | Discharge: 2017-05-02 | Disposition: A | Discharge status: Home / Self Care | Payer: Medicare Other | Source: Ambulatory Visit | Attending: Hematology | Admitting: Hematology

## 2017-05-02 DIAGNOSIS — I313 Pericardial effusion (noninflammatory): Secondary | ICD-10-CM | POA: Diagnosis not present

## 2017-05-02 DIAGNOSIS — I119 Hypertensive heart disease without heart failure: Secondary | ICD-10-CM | POA: Diagnosis not present

## 2017-05-02 DIAGNOSIS — I350 Nonrheumatic aortic (valve) stenosis: Secondary | ICD-10-CM | POA: Diagnosis not present

## 2017-05-02 DIAGNOSIS — E785 Hyperlipidemia, unspecified: Secondary | ICD-10-CM | POA: Insufficient documentation

## 2017-05-02 DIAGNOSIS — E8581 Light chain (AL) amyloidosis: Secondary | ICD-10-CM

## 2017-05-02 NOTE — Progress Notes (Signed)
  Echocardiogram 2D Echocardiogram has been performed.  Jose Garrett 05/02/2017, 9:51 AM

## 2017-05-03 DIAGNOSIS — D631 Anemia in chronic kidney disease: Secondary | ICD-10-CM | POA: Diagnosis not present

## 2017-05-03 DIAGNOSIS — N186 End stage renal disease: Secondary | ICD-10-CM | POA: Diagnosis not present

## 2017-05-03 DIAGNOSIS — D509 Iron deficiency anemia, unspecified: Secondary | ICD-10-CM | POA: Diagnosis not present

## 2017-05-03 DIAGNOSIS — N2581 Secondary hyperparathyroidism of renal origin: Secondary | ICD-10-CM | POA: Diagnosis not present

## 2017-05-06 DIAGNOSIS — D631 Anemia in chronic kidney disease: Secondary | ICD-10-CM | POA: Diagnosis not present

## 2017-05-06 DIAGNOSIS — N186 End stage renal disease: Secondary | ICD-10-CM | POA: Diagnosis not present

## 2017-05-06 DIAGNOSIS — D509 Iron deficiency anemia, unspecified: Secondary | ICD-10-CM | POA: Diagnosis not present

## 2017-05-06 DIAGNOSIS — N2581 Secondary hyperparathyroidism of renal origin: Secondary | ICD-10-CM | POA: Diagnosis not present

## 2017-05-08 DIAGNOSIS — D631 Anemia in chronic kidney disease: Secondary | ICD-10-CM | POA: Diagnosis not present

## 2017-05-08 DIAGNOSIS — D509 Iron deficiency anemia, unspecified: Secondary | ICD-10-CM | POA: Diagnosis not present

## 2017-05-08 DIAGNOSIS — N186 End stage renal disease: Secondary | ICD-10-CM | POA: Diagnosis not present

## 2017-05-08 DIAGNOSIS — N2581 Secondary hyperparathyroidism of renal origin: Secondary | ICD-10-CM | POA: Diagnosis not present

## 2017-05-10 DIAGNOSIS — Z992 Dependence on renal dialysis: Secondary | ICD-10-CM | POA: Diagnosis not present

## 2017-05-10 DIAGNOSIS — Z23 Encounter for immunization: Secondary | ICD-10-CM | POA: Diagnosis not present

## 2017-05-10 DIAGNOSIS — D509 Iron deficiency anemia, unspecified: Secondary | ICD-10-CM | POA: Diagnosis not present

## 2017-05-10 DIAGNOSIS — N2581 Secondary hyperparathyroidism of renal origin: Secondary | ICD-10-CM | POA: Diagnosis not present

## 2017-05-10 DIAGNOSIS — D631 Anemia in chronic kidney disease: Secondary | ICD-10-CM | POA: Diagnosis not present

## 2017-05-10 DIAGNOSIS — I129 Hypertensive chronic kidney disease with stage 1 through stage 4 chronic kidney disease, or unspecified chronic kidney disease: Secondary | ICD-10-CM | POA: Diagnosis not present

## 2017-05-10 DIAGNOSIS — N186 End stage renal disease: Secondary | ICD-10-CM | POA: Diagnosis not present

## 2017-05-13 DIAGNOSIS — N2581 Secondary hyperparathyroidism of renal origin: Secondary | ICD-10-CM | POA: Diagnosis not present

## 2017-05-13 DIAGNOSIS — D509 Iron deficiency anemia, unspecified: Secondary | ICD-10-CM | POA: Diagnosis not present

## 2017-05-13 DIAGNOSIS — N186 End stage renal disease: Secondary | ICD-10-CM | POA: Diagnosis not present

## 2017-05-13 DIAGNOSIS — D631 Anemia in chronic kidney disease: Secondary | ICD-10-CM | POA: Diagnosis not present

## 2017-05-13 DIAGNOSIS — Z23 Encounter for immunization: Secondary | ICD-10-CM | POA: Diagnosis not present

## 2017-05-13 NOTE — Progress Notes (Signed)
.    Hematology/Oncology clinic note  Date of service: 05/14/17  Patient Care Team: [name], MD as PCP - General (Family Medicine) [name], MD as PCP - Hematology/Oncology (Hematology and Oncology) [name], MD as Consulting Physician (Nephrology)  CHIEF COMPLAINTS: followup for AL amyloidosis.  HISTORY OF PRESENTING ILLNESS: Please see my initial consultation for details on presentation.  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 AL amyloidosis noted on kidney biopsy diagnosed 09/28/2014.    previous treatment  CyBorD x 6 cycles Evaluated for and declined Auto HSCT at [facility]. - maintenance Velcade  Ixazomib + Dexamethasone s/p 6 cycles  Ixazomib maintenance q2weeks  Current treatment  Active surveillance -off Maintenance Ixazomib due to concerns for neuropathy.   INTERVAL HISTORY   [name] is here for his scheduled 2 month follow-up for AL amyloidosis. He presents to the clinic today noting his neuropathy has worsened and his back pain has increased recently despite being off Ninlaro for 2 months. He has not seen his neurologist since his nerve study.He would like a refill of oxycodone and gabapentin. He notes his main issue is his neuropathy and back pain with radiculopathy that he wants to get under control.   On review of symptoms, pt  Neuropathy worsened and is effecting his sleep. Currently "firing" in his feet. He is taking a table spoon (520m) of gabapentin currently. He is will to try the capsules. He is also on amitriptyline given by his urologist. He has not used oxycodone in a while but would like to get more due to his back pain. His back pain can radiate to different parts of his body. He notes this can lead to muscle spasm around the body. He has not seen orthopedist for his back pain. Digit 1 and 2 of left foot has became erythemas.     MEDICAL HISTORY:    Past Medical History:  Diagnosis Date  . [place]yloidosis ([place]   . Anemia Dx 2016  . Chronic diastolic CHF (congestive heart failure), NYHA class 1 ([place]    /hospital problem list 09/08/2014  . Chronic renal disease, stage III ([place]    /hospital problem list 09/08/2014  . Diarrhea   . Dyspnea    with exertion  . Enlarged heart 2015  . Family history of adverse reaction to anesthesia    "my daughter can't take certain anesthesia agents" (09/08/2014)  . GERD (gastroesophageal reflux disease)   . History of blood transfusion 09/08/2014   "got hematoma after renal biopsy & HgB dropped"  . Hyperlipidemia Dx 2012  . Hypertension Dx 2012  . Renal disorder    "there is a spot on my kidney" (09/08/2014)  . Restless legs     SURGICAL HISTORY: Past Surgical History:  Procedure Laterality Date  . AV FISTULA PLACEMENT Right 12/27/2015   Procedure: Right Arm ARTERIOVENOUS (AV) FISTULA CREATION;  Surgeon: CAngelia Mould MD;  [facility]  Service: Vascular;  Laterality: Right;  . COLONOSCOPY    . IR GENERIC HISTORICAL  01/27/2016   IR FLUORO GUIDE CV LINE RIGHT 01/27/2016 DArne Cleveland MD [facility]  . IR GENERIC HISTORICAL  01/27/2016   IR [place] ACCESS RIGHT 01/27/2016 DArne Cleveland MD [facility]  . LAPAROSCOPIC CHOLECYSTECTOMY  03/2011  . RENAL BIOPSY, PERCUTANEOUS Right 09/08/2014  . TONSILLECTOMY  ~ 1960    SOCIAL HISTORY: Social History   Socioeconomic History  . Marital status: Divorced  Spouse name: Not on file  . Number of children: Not on file  . Years of education: Not on file  . Highest education level: Not on file  Social Needs  . Financial resource strain: Not on file  . Food insecurity - worry: Not on file  . Food insecurity - inability: Not on file  . Transportation needs - medical: Not on file  . Transportation needs - non-medical: Not on file  Occupational History  . Not on file  Tobacco Use  . Smoking status: Former Smoker     Packs/day: 2.00    Years: 40.00    Pack years: 80.00    Types: Cigarettes    Last attempt to quit: 03/19/2011    Years since quitting: 6.1  . Smokeless tobacco: Never Used  Substance and Sexual Activity  . Alcohol use: No    Alcohol/week: 0.0 oz  . Drug use: No    Comment: 09/08/2014 "I have used marijuana till the 1990's". Notes that he quit 15 yrs ago  . Sexual activity: Not Currently  Other Topics Concern  . 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 Lives with his daughter and her finance.  FAMILY HISTORY: Family History  Problem Relation Age of Onset  . Hypertension Mother   . Diabetes Mother   . Heart disease Mother   . Skin cancer Mother   . Hypertension Father   . Diabetes Father   . Diabetes Sister     ALLERGIES:  is allergic to no known allergies.  MEDICATIONS:  Current Outpatient Medications  Medication Sig Dispense Refill  . acetaminophen (TYLENOL) 325 MG tablet Take 325-650 mg by mouth every 6 (six) hours as needed (back pain).    [name] acyclovir (ZOVIRAX) 400 MG tablet TAKE 1 TABLET BY MOUTH DAILY. (DOSE ADJUSTED FOR RENAL FUNCTION) 60 tablet 2  . amitriptyline (ELAVIL) 50 MG tablet Take 50 mg by mouth at bedtime.    [name] aspirin EC 81 MG tablet Take 1 tablet (81 mg total) by mouth daily. 30 tablet 11  . B Complex-C-Folic Acid (DIALYVITE 160) 0.8 MG TABS Take 0.8 mg by mouth daily.    . benzonatate (TESSALON) 100 MG capsule Take 1 capsule (100 mg total) by mouth every 8 (eight) hours. 21 capsule 0  . cetirizine (ZYRTEC) 10 MG tablet Take 10 mg by mouth daily.    . Cholecalciferol (VITAMIN D3) 3000 units TABS Take 3,000 Units by mouth daily.    . cyanocobalamin 2000 MCG tablet Take 2,500 mcg by mouth daily.    [name] Epoetin Alfa (PROCRIT IJ) Inject as directed  every 14 (fourteen) days. Done at [facility] Last injection 01/19/16    . FLONASE 50 MCG/ACT nasal spray Place 1 spray into both nostrils daily as needed. (Patient taking differently: Place 1 spray into both nostrils daily as needed for allergies or rhinitis. )    . gabapentin (NEURONTIN) 300 MG capsule 330m po qAM and 601mpo qHS 90 capsule 1  . ipratropium (ATROVENT) 0.06 % nasal spray Place 2 sprays into both nostrils 4 (four) times daily. 15 mL 0  . lanthanum (FOSRENOL) 1000 MG chewable tablet Chew 1,000 mg by mouth 2 (two) times daily with a meal.    . LORazepam (  ATIVAN) 1 MG tablet Take 1 tablet (1 mg total) by mouth daily as needed for anxiety (nausea). 30 tablet 0  . multivitamin (RENA-VIT) TABS tablet Take 1 tablet by mouth daily.    [name] oxyCODONE (OXY IR/ROXICODONE) 5 MG immediate release tablet Take 1-2 tablets (5-10 mg total) by mouth every 6 (six) hours as needed for severe pain. 60 tablet 0  . VELPHORO 500 MG chewable tablet Chew by mouth daily.  10   No current facility-administered medications for this visit.     REVIEW OF SYSTEMS:   .10 Point review of Systems was done is negative except as noted above.  PHYSICAL EXAMINATION: ECOG PERFORMANCE STATUS: 2 - Symptomatic, <50% confined to bed  Vitals:   05/14/17 0849  BP: (!) 160/64  Pulse: 91  Resp: 20  Temp: 98.8 F (37.1 C)  SpO2: 97%   Filed Weights   05/14/17 0849  Weight: 199 lb 8 oz (90.5 kg)   . GENERAL:alert, in no acute distress and comfortable SKIN: no acute rashes, no significant lesions EYES: conjunctiva are pink and non-injected, sclera anicteric OROPHARYNX: MMM, no exudates, no oropharyngeal erythema or ulceration NECK: supple, no JVD LYMPH:  no palpable lymphadenopathy in the cervical, axillary or inguinal regions LUNGS: clear to auscultation b/l with normal respiratory effort HEART: regular rate & rhythm, 2/6 SM over the aortic area. ABDOMEN:  normoactive bowel sounds , non tender, not  distended. Extremity: no pedal edema PSYCH: alert & oriented x 3 with fluent speech NEURO: no focal motor/sensory deficits  LABORATORY DATA:   . CBC Latest Ref Rng & Units 05/14/2017 03/14/2017 01/08/2017  WBC 4.0 - 10.3 K/uL 7.5 4.6 6.6  Hemoglobin 13.0 - 17.1 g/dL - 10.8(L) 12.1(L)  Hematocrit 38.4 - 49.9 % 35.3(L) 32.5(L) 36.7(L)  Platelets 140 - 400 K/uL 130(L) 78(L) 105(L)  HGB 11.4 . CMP Latest Ref Rng & Units 05/14/2017 03/14/2017 03/14/2017  Glucose 70 - 140 mg/dL 148(H) 144(H) -  BUN 7 - 26 mg/dL 28(H) 30.1(H) -  Creatinine 0.70 - 1.30 mg/dL 7.74(HH) 8.1(HH) -  Sodium 136 - 145 mmol/L 140 141 -  Potassium 3.5 - 5.1 mmol/L 3.5 4.1 -  Chloride 98 - 109 mmol/L 94(L) - -  CO2 22 - 29 mmol/L 27 33(H) -  Calcium 8.4 - 10.4 mg/dL 10.1 9.5 -  Total Protein 6.4 - 8.3 g/dL 7.8 7.7 7.3  Total Bilirubin 0.2 - 1.2 mg/dL 0.8 0.67 -  Alkaline Phos 40 - 150 U/L 69 69 -  AST 5 - 34 U/L 16 19 -  ALT 0 - 55 U/L 19 25 -        Component     Latest Ref Rng & Units 11/06/2016 01/08/2017 01/08/2017         9:10 AM  9:11 AM  9:12 AM  IgG (Immunoglobin G), Serum     700 - 1,600 mg/dL 937  1,021  IgA/Immunoglobulin A, Serum     61 - 437 mg/dL 97  112  IgM, Qn, Serum     20 - 172 mg/dL 136  158  Total Protein     6.4 - 8.3 g/dL 6.9 8.4 (H) 7.5  Albumin SerPl Elph-Mcnc     2.9 - 4.4 g/dL 3.9  3.9  Alpha 1     0.0 - 0.4 g/dL 0.2  0.3  Alpha2 Glob SerPl Elph-Mcnc     0.4 - 1.0 g/dL 0.9  1.0  B-Globulin SerPl Elph-Mcnc     0.7 - 1.3 g/dL  0.9  1.1  Gamma Glob SerPl Elph-Mcnc     0.4 - 1.8 g/dL 1.0  1.2  M Protein SerPl Elph-Mcnc     Not Observed g/dL Not Observed  Not Observed  Globulin, Total     2.2 - 3.9 g/dL 3.0  3.6  Albumin/Glob SerPl     0.7 - 1.7 1.4  1.1  IFE 1      Comment  Comment  Please Note (HCV):      Comment  Comment   Component     Latest Ref Rng & Units 03/14/2017 03/14/2017 05/14/2017         9:21 AM  9:21 AM   IgG (Immunoglobin G), Serum     700 - 1,600 mg/dL   950 972  IgA/Immunoglobulin A, Serum     61 - 437 mg/dL  99   IgM, Qn, Serum     20 - 172 mg/dL  152   Total Protein     6.4 - 8.3 g/dL 7.7 7.3 7.8  Albumin SerPl Elph-Mcnc     2.9 - 4.4 g/dL  3.9 4.1  Alpha 1     0.0 - 0.4 g/dL  0.3 0.2  Alpha2 Glob SerPl Elph-Mcnc     0.4 - 1.0 g/dL  1.0 1.0  B-Globulin SerPl Elph-Mcnc     0.7 - 1.3 g/dL  1.0 0.9  Gamma Glob SerPl Elph-Mcnc     0.4 - 1.8 g/dL  1.1 1.0  M Protein SerPl Elph-Mcnc     Not Observed g/dL  Not Observed Not Observed  Globulin, Total     2.2 - 3.9 g/dL  3.4 3.2  Albumin/Glob SerPl     0.7 - 1.7  1.2 1.3  IFE 1       Comment Comment  Please Note (HCV):       Comment Comment    Echocardiogram 05/02/2017   *[facility]*                         [address], [place] [address]                            [id number]  ------------------------------------------------------------------- Transthoracic Echocardiography  Patient:    Keiyon, Plack MR #:       [id number] Study Date: 05/02/2017 Gender:     M Age:        15 Height:     170.2 cm Weight:     92.4 kg BSA:        2.12 m^2 Pt. Status: Room:   PERFORMING   [facility], Outpatient  ATTENDING    [place], [place]    [place], [place] Kishore  SONOGRAPHER  Jannett Celestine, RDCS  cc:  ------------------------------------------------------------------- LV EF: 60% -   65%  ------------------------------------------------------------------- History:   PMH:  Amyloidosis. Enlarged Heart. F/U for Amyloidosis. Risk factors:  Hypertension. Dyslipidemia.  ------------------------------------------------------------------- Study Conclusions  - Left ventricle: The cavity size was normal. Wall thickness was   increased in a pattern of moderate LVH. Systolic function was   normal. The estimated ejection fraction  was in the range of  60%   to 65%. Wall motion was normal; there were no regional wall   motion abnormalities. Doppler parameters are consistent with   abnormal left ventricular relaxation (grade 1 diastolic   dysfunction). Strain pattern abnormal in the basal to mid wall   and preserved in the apex. - Aortic valve: Trileaflet; severely calcified leaflets. There was   mild stenosis. Mean gradient (S): 19 mm Hg. Valve area (VTI):   1.72 cm^2. - Mitral valve: There was no significant regurgitation. - Right ventricle: The cavity size was normal. Systolic function   was normal. - Pulmonary arteries: No complete TR doppler jet so unable to   estimate PA systolic pressure. - Inferior vena cava: The vessel was normal in size. The   respirophasic diameter changes were in the normal range (= 50%),   consistent with normal central venous pressure. - Pericardium, extracardiac: A trivial pericardial effusion was   identified.  Impressions:  - Normal LV size with moderate LV hypertrophy. EF 60-65%. Bull&'s   eye strain pattern can be seen with cardiac amyloidosis. Normal   RV size and systolic function. Mild aortic stenosis. Trivial   pericardial effusion. This study is suggestive of cardiac   amyloidosis.  RADIOGRAPHIC STUDIES: I have personally reviewed the radiological images as listed and agreed with the findings in the report.   ASSESSMENT & PLAN:   Mr Jose Garrett is a very pleasant 67 yo caucasian male with   1) AL Amyloidosis noted on renal biopsy with nephrotic range proteinuria on urinalysis and some lambda free light chains on serum and urinary IFE . On diagnosis:  SPEP with small amount of M-spike 0.3  Echo showed no systolic dysfunction but grade 2 diastolic dysfunction. [facility] cardiac staging 1 as per troponin and BNP criteria. Bone marrow with no overt evidence of multiple myeloma. PET/CT scan showed no evidence of bony lesions or lymphadenopathy . Urine showed about 15 g of protein in 24  hours   Patient is status post CyBorD x 6 cycles He was then on maintenance Velcade and had progression of his AL Amyloidosis. He was then switched to Ixazomib and Dexamethasone and has completed 6 cycles without significant toxicities. Echo repeated on 12/11/2016 shows no changes in heart function compared to previous echo from earlier this year.  Myeloma panel/SFLC WNL with no evidence of progressive disease. ECHO repeated on 05/02/17 shows Normal LV size with moderate LV hypertrophy. EF 60-65%. Bull&'s eye strain pattern can be seen with cardiac amyloidosis. Normal RV size and systolic function. Mild aortic stenosis. Trivial pericardial effusion. This study is suggestive of cardiac   amyloidosis.  Maintenance Ixazomib q2weeks since 2017 and tolerating it without any significant new toxicities. Worsened multifactorial neuropathy. Held Ixazomib since 03/14/17.     2) Thrombocytopenia mild likely from the Ixazomib. Patient is already on renally adjusted dose of the medication. PLT increased to 138k today from 78K - will continue to monitor  Plan  -Has grade 2 neuropathy from previous Velcade/B12 def /HD which continues to progress.  -He has had back pain issues as well and cannot r/o a component of radiculopathy. He has been seen by neurologist and will continue to follow up. -given his persistent and bothersome neuropathy he has elected to hold his Ixazomib since 03/14/17. Continue to hold Ixazomib until neuropathy and back pain is better controlled. No overt prohibitive toxicity from Ixazomib. --myeloma panel from 03/2017 for M spike not detectable and serum free light chain ration  WNL.  --continue on low-dose aspirin for VTE prophylaxis given his high risk of venous thromboembolism in the setting of nephrotic syndrome.  -continue Acyclovir for shingles prophylaxis. -Repeat Echocardiogram from 05/02/17 show stable heart function    2) multifactorial anemia - likely multifactorial -from AL  amyloidosis, anemia from CKD. Hemoglobin improved to 11.4   B12 WNL -Previously deficient. B12 07/12/2016 -- 1035 Plan  -Continue ESA's prn and iron replacement as per nephrology. -continue B12 replacement SL   3) CKD stage V-related to hypertension and AL Amyloidosis. Has been started on hemodialysis through a tunnel catheter since his last clinic visit. Had AV fistula placement on 12/27/2015  Plan  -Continue hemodialysis as per nephrologist.   4) Grade 1-2 peripheral neuropathy, worsening recently. Feet tingling and numbness ? Related to AL Amyloidosis vs Velcade vs B12 deficiency vs HD (now)vs radiculopathy from DDD  stable.  Plan  -Increase Neurontin 340m capsules to 1 in the morning and 2 at night.  -He is also on amitriptyline. I previously discussed the option of switching his amitriptyline to Cymbalta.  -I suggested the QUELL unit to reduce pain sensations from neuropathy. I also recommended acupuncture. -continue SL B12  --Last B12 levels WNL with replacement on 01/08/17 -He has seen neurologist and completed nerve conduction study -f/u with neurology for EMG/NCS -given orthopedics referral for his back pain and DDD  5) Chronic Back Pain  -Recently became uncontrolled pain PLAN -I refilled his oxycodone q6h PRN today (05/14/17). If he needs longer acting pain medication, I suggest he see a pain specialist for pain management.  -I will send referral to orthopedist to further evaluate and manage his back pain.   6) Color change in feet - erythemas on digit 1 and 2 of left foot PLAN:  -I discussed this is likely not related to his neuropathy but more so a circulatory issue.  -I recommend he see a PCP for a [place] r/o peripheral artery disease.  -Pt will notify [place] his new PCP is so I can send recommendation.   Continue to Hold Ixazomib RTC with Dr KIrene Limboin 2 months with labs Referral to orthopedics for acute on chronic back pain  All questions were answered. The patient  knows to call the clinic with any problems, questions or concerns.  . The total time spent in the appointment was 25 minutes and more than 50% was on counseling and direct patient cares.   GSullivan LoneMD MS Hematology/Oncology Physician [facility] (Office):       [phone](Work cell):  [phone](Fax):           [phone] This document serves as a record of services personally performed by GSullivan Lone MD. It was created on his behalf by AJoslyn Devon a trained medical scribe. The creation of this record is based on the scribe's personal observations and the provider's statements to them.   .I have reviewed the above documentation for accuracy and completeness, and I agree with the above. .Brunetta GeneraMD MS

## 2017-05-14 ENCOUNTER — Inpatient Hospital Stay: Payer: Medicare Other | Attending: Hematology | Admitting: Hematology

## 2017-05-14 ENCOUNTER — Inpatient Hospital Stay: Payer: Medicare Other

## 2017-05-14 ENCOUNTER — Telehealth: Payer: Self-pay

## 2017-05-14 VITALS — BP 160/64 | HR 91 | Temp 98.8°F | Resp 20 | Ht 67.0 in | Wt 199.5 lb

## 2017-05-14 DIAGNOSIS — Z87891 Personal history of nicotine dependence: Secondary | ICD-10-CM | POA: Diagnosis not present

## 2017-05-14 DIAGNOSIS — M549 Dorsalgia, unspecified: Secondary | ICD-10-CM | POA: Insufficient documentation

## 2017-05-14 DIAGNOSIS — M5442 Lumbago with sciatica, left side: Secondary | ICD-10-CM

## 2017-05-14 DIAGNOSIS — N183 Chronic kidney disease, stage 3 (moderate): Secondary | ICD-10-CM

## 2017-05-14 DIAGNOSIS — I350 Nonrheumatic aortic (valve) stenosis: Secondary | ICD-10-CM | POA: Diagnosis not present

## 2017-05-14 DIAGNOSIS — G629 Polyneuropathy, unspecified: Secondary | ICD-10-CM | POA: Insufficient documentation

## 2017-05-14 DIAGNOSIS — E785 Hyperlipidemia, unspecified: Secondary | ICD-10-CM

## 2017-05-14 DIAGNOSIS — Z79899 Other long term (current) drug therapy: Secondary | ICD-10-CM | POA: Insufficient documentation

## 2017-05-14 DIAGNOSIS — G2581 Restless legs syndrome: Secondary | ICD-10-CM | POA: Insufficient documentation

## 2017-05-14 DIAGNOSIS — E8581 Light chain (AL) amyloidosis: Secondary | ICD-10-CM | POA: Diagnosis not present

## 2017-05-14 DIAGNOSIS — K219 Gastro-esophageal reflux disease without esophagitis: Secondary | ICD-10-CM | POA: Diagnosis not present

## 2017-05-14 DIAGNOSIS — Z992 Dependence on renal dialysis: Secondary | ICD-10-CM | POA: Diagnosis not present

## 2017-05-14 DIAGNOSIS — D649 Anemia, unspecified: Secondary | ICD-10-CM

## 2017-05-14 DIAGNOSIS — I313 Pericardial effusion (noninflammatory): Secondary | ICD-10-CM | POA: Diagnosis not present

## 2017-05-14 DIAGNOSIS — R197 Diarrhea, unspecified: Secondary | ICD-10-CM | POA: Insufficient documentation

## 2017-05-14 DIAGNOSIS — I132 Hypertensive heart and chronic kidney disease with heart failure and with stage 5 chronic kidney disease, or end stage renal disease: Secondary | ICD-10-CM | POA: Diagnosis not present

## 2017-05-14 DIAGNOSIS — Z7982 Long term (current) use of aspirin: Secondary | ICD-10-CM | POA: Diagnosis not present

## 2017-05-14 DIAGNOSIS — D696 Thrombocytopenia, unspecified: Secondary | ICD-10-CM | POA: Diagnosis not present

## 2017-05-14 DIAGNOSIS — E538 Deficiency of other specified B group vitamins: Secondary | ICD-10-CM

## 2017-05-14 DIAGNOSIS — M5441 Lumbago with sciatica, right side: Secondary | ICD-10-CM

## 2017-05-14 DIAGNOSIS — I5032 Chronic diastolic (congestive) heart failure: Secondary | ICD-10-CM | POA: Diagnosis not present

## 2017-05-14 LAB — CBC WITH DIFFERENTIAL (CANCER CENTER ONLY)
BASOS ABS: 0 10*3/uL (ref 0.0–0.1)
BASOS PCT: 0 %
EOS ABS: 0.1 10*3/uL (ref 0.0–0.5)
EOS PCT: 2 %
HCT: 35.3 % — ABNORMAL LOW (ref 38.4–49.9)
Hemoglobin: 11.4 g/dL — ABNORMAL LOW (ref 13.0–17.1)
Lymphocytes Relative: 17 %
Lymphs Abs: 1.3 10*3/uL (ref 0.9–3.3)
MCH: 35.3 pg — ABNORMAL HIGH (ref 27.2–33.4)
MCHC: 32.3 g/dL (ref 32.0–36.0)
MCV: 109.3 fL — ABNORMAL HIGH (ref 79.3–98.0)
MONO ABS: 0.6 10*3/uL (ref 0.1–0.9)
MONOS PCT: 8 %
Neutro Abs: 5.5 10*3/uL (ref 1.5–6.5)
Neutrophils Relative %: 73 %
PLATELETS: 130 10*3/uL — AB (ref 140–400)
RBC: 3.23 MIL/uL — ABNORMAL LOW (ref 4.20–5.82)
RDW: 15.5 % — ABNORMAL HIGH (ref 11.0–14.6)
WBC Count: 7.5 10*3/uL (ref 4.0–10.3)

## 2017-05-14 LAB — COMPREHENSIVE METABOLIC PANEL
ALBUMIN: 4 g/dL (ref 3.5–5.0)
ALK PHOS: 69 U/L (ref 40–150)
ALT: 19 U/L (ref 0–55)
AST: 16 U/L (ref 5–34)
Anion gap: 19 — ABNORMAL HIGH (ref 3–11)
BILIRUBIN TOTAL: 0.8 mg/dL (ref 0.2–1.2)
BUN: 28 mg/dL — AB (ref 7–26)
CALCIUM: 10.1 mg/dL (ref 8.4–10.4)
CO2: 27 mmol/L (ref 22–29)
CREATININE: 7.74 mg/dL — AB (ref 0.70–1.30)
Chloride: 94 mmol/L — ABNORMAL LOW (ref 98–109)
GFR calc Af Amer: 8 mL/min — ABNORMAL LOW (ref >60)
GFR calc non Af Amer: 6 mL/min — ABNORMAL LOW (ref >60)
Glucose, Bld: 148 mg/dL — ABNORMAL HIGH (ref 70–140)
Potassium: 3.5 mmol/L (ref 3.5–5.1)
Sodium: 140 mmol/L (ref 136–145)
TOTAL PROTEIN: 7.8 g/dL (ref 6.4–8.3)

## 2017-05-14 LAB — RETICULOCYTES
RBC.: 3.23 MIL/uL — AB (ref 4.20–5.82)
RETIC COUNT ABSOLUTE: 96.9 10*3/uL — AB (ref 34.8–93.9)
Retic Ct Pct: 3 % — ABNORMAL HIGH (ref 0.8–1.8)

## 2017-05-14 MED ORDER — OXYCODONE HCL 5 MG PO TABS: 5.0000 mg | ORAL_TABLET | Freq: Four times a day (QID) | ORAL | 0 refills | Status: DC | PRN

## 2017-05-14 MED ORDER — GABAPENTIN 300 MG PO CAPS: ORAL_CAPSULE | ORAL | 1 refills | Status: DC

## 2017-05-14 NOTE — Telephone Encounter (Signed)
Printed avs and calender of upcoming appointments, also faxed referral to [facility]. Per 3/5 los

## 2017-05-15 DIAGNOSIS — N186 End stage renal disease: Secondary | ICD-10-CM | POA: Diagnosis not present

## 2017-05-15 DIAGNOSIS — D631 Anemia in chronic kidney disease: Secondary | ICD-10-CM | POA: Diagnosis not present

## 2017-05-15 DIAGNOSIS — Z23 Encounter for immunization: Secondary | ICD-10-CM | POA: Diagnosis not present

## 2017-05-15 DIAGNOSIS — D509 Iron deficiency anemia, unspecified: Secondary | ICD-10-CM | POA: Diagnosis not present

## 2017-05-15 DIAGNOSIS — N2581 Secondary hyperparathyroidism of renal origin: Secondary | ICD-10-CM | POA: Diagnosis not present

## 2017-05-15 LAB — KAPPA/LAMBDA LIGHT CHAINS
KAPPA FREE LGHT CHN: 146.1 mg/L — AB (ref 3.3–19.4)
Kappa, lambda light chain ratio: 1.36 (ref 0.26–1.65)
LAMDA FREE LIGHT CHAINS: 107.3 mg/L — AB (ref 5.7–26.3)

## 2017-05-16 LAB — MULTIPLE MYELOMA PANEL, SERUM
ALBUMIN/GLOB SERPL: 1.3 (ref 0.7–1.7)
ALPHA 1: 0.2 g/dL (ref 0.0–0.4)
Albumin SerPl Elph-Mcnc: 4.1 g/dL (ref 2.9–4.4)
Alpha2 Glob SerPl Elph-Mcnc: 1 g/dL (ref 0.4–1.0)
B-Globulin SerPl Elph-Mcnc: 0.9 g/dL (ref 0.7–1.3)
GAMMA GLOB SERPL ELPH-MCNC: 1 g/dL (ref 0.4–1.8)
GLOBULIN, TOTAL: 3.2 g/dL (ref 2.2–3.9)
IGA: 157 mg/dL (ref 61–437)
IgG (Immunoglobin G), Serum: 972 mg/dL (ref 700–1600)
IgM (Immunoglobulin M), Srm: 158 mg/dL (ref 20–172)
Total Protein ELP: 7.3 g/dL (ref 6.0–8.5)

## 2017-05-17 DIAGNOSIS — Z23 Encounter for immunization: Secondary | ICD-10-CM | POA: Diagnosis not present

## 2017-05-17 DIAGNOSIS — D509 Iron deficiency anemia, unspecified: Secondary | ICD-10-CM | POA: Diagnosis not present

## 2017-05-17 DIAGNOSIS — D631 Anemia in chronic kidney disease: Secondary | ICD-10-CM | POA: Diagnosis not present

## 2017-05-17 DIAGNOSIS — N2581 Secondary hyperparathyroidism of renal origin: Secondary | ICD-10-CM | POA: Diagnosis not present

## 2017-05-17 DIAGNOSIS — N186 End stage renal disease: Secondary | ICD-10-CM | POA: Diagnosis not present

## 2017-05-20 DIAGNOSIS — D631 Anemia in chronic kidney disease: Secondary | ICD-10-CM | POA: Diagnosis not present

## 2017-05-20 DIAGNOSIS — D509 Iron deficiency anemia, unspecified: Secondary | ICD-10-CM | POA: Diagnosis not present

## 2017-05-20 DIAGNOSIS — Z23 Encounter for immunization: Secondary | ICD-10-CM | POA: Diagnosis not present

## 2017-05-20 DIAGNOSIS — N2581 Secondary hyperparathyroidism of renal origin: Secondary | ICD-10-CM | POA: Diagnosis not present

## 2017-05-20 DIAGNOSIS — N186 End stage renal disease: Secondary | ICD-10-CM | POA: Diagnosis not present

## 2017-05-22 DIAGNOSIS — N2581 Secondary hyperparathyroidism of renal origin: Secondary | ICD-10-CM | POA: Diagnosis not present

## 2017-05-22 DIAGNOSIS — D509 Iron deficiency anemia, unspecified: Secondary | ICD-10-CM | POA: Diagnosis not present

## 2017-05-22 DIAGNOSIS — N186 End stage renal disease: Secondary | ICD-10-CM | POA: Diagnosis not present

## 2017-05-22 DIAGNOSIS — Z23 Encounter for immunization: Secondary | ICD-10-CM | POA: Diagnosis not present

## 2017-05-22 DIAGNOSIS — D631 Anemia in chronic kidney disease: Secondary | ICD-10-CM | POA: Diagnosis not present

## 2017-05-23 ENCOUNTER — Ambulatory Visit (INDEPENDENT_AMBULATORY_CARE_PROVIDER_SITE_OTHER): Payer: Self-pay | Admitting: Surgery

## 2017-05-23 ENCOUNTER — Telehealth: Payer: Self-pay

## 2017-05-23 DIAGNOSIS — M5417 Radiculopathy, lumbosacral region: Secondary | ICD-10-CM | POA: Diagnosis not present

## 2017-05-23 DIAGNOSIS — M545 Low back pain: Secondary | ICD-10-CM | POA: Diagnosis not present

## 2017-05-23 NOTE — Telephone Encounter (Signed)
Returned call to patient concerning referral appointments, spoke to him about [facility] scheduler do not schedule, or cancel any outside agency appointments. Only faxed referral to the aproperiate organization. Per 3/14 phone message return call.

## 2017-05-24 ENCOUNTER — Telehealth: Payer: Self-pay | Admitting: Internal Medicine

## 2017-05-24 DIAGNOSIS — Z23 Encounter for immunization: Secondary | ICD-10-CM | POA: Diagnosis not present

## 2017-05-24 DIAGNOSIS — N2581 Secondary hyperparathyroidism of renal origin: Secondary | ICD-10-CM | POA: Diagnosis not present

## 2017-05-24 DIAGNOSIS — N186 End stage renal disease: Secondary | ICD-10-CM | POA: Diagnosis not present

## 2017-05-24 DIAGNOSIS — D631 Anemia in chronic kidney disease: Secondary | ICD-10-CM | POA: Diagnosis not present

## 2017-05-24 DIAGNOSIS — D509 Iron deficiency anemia, unspecified: Secondary | ICD-10-CM | POA: Diagnosis not present

## 2017-05-24 NOTE — Telephone Encounter (Signed)
Received fax from [facility] note, fax will on the pcp in-box

## 2017-05-27 DIAGNOSIS — D509 Iron deficiency anemia, unspecified: Secondary | ICD-10-CM | POA: Diagnosis not present

## 2017-05-27 DIAGNOSIS — N186 End stage renal disease: Secondary | ICD-10-CM | POA: Diagnosis not present

## 2017-05-27 DIAGNOSIS — D631 Anemia in chronic kidney disease: Secondary | ICD-10-CM | POA: Diagnosis not present

## 2017-05-27 DIAGNOSIS — N2581 Secondary hyperparathyroidism of renal origin: Secondary | ICD-10-CM | POA: Diagnosis not present

## 2017-05-27 DIAGNOSIS — Z23 Encounter for immunization: Secondary | ICD-10-CM | POA: Diagnosis not present

## 2017-05-29 DIAGNOSIS — Z23 Encounter for immunization: Secondary | ICD-10-CM | POA: Diagnosis not present

## 2017-05-29 DIAGNOSIS — N186 End stage renal disease: Secondary | ICD-10-CM | POA: Diagnosis not present

## 2017-05-29 DIAGNOSIS — D631 Anemia in chronic kidney disease: Secondary | ICD-10-CM | POA: Diagnosis not present

## 2017-05-29 DIAGNOSIS — D509 Iron deficiency anemia, unspecified: Secondary | ICD-10-CM | POA: Diagnosis not present

## 2017-05-29 DIAGNOSIS — N2581 Secondary hyperparathyroidism of renal origin: Secondary | ICD-10-CM | POA: Diagnosis not present

## 2017-05-30 ENCOUNTER — Ambulatory Visit (INDEPENDENT_AMBULATORY_CARE_PROVIDER_SITE_OTHER): Payer: Self-pay | Admitting: Surgery

## 2017-05-31 DIAGNOSIS — N2581 Secondary hyperparathyroidism of renal origin: Secondary | ICD-10-CM | POA: Diagnosis not present

## 2017-05-31 DIAGNOSIS — D631 Anemia in chronic kidney disease: Secondary | ICD-10-CM | POA: Diagnosis not present

## 2017-05-31 DIAGNOSIS — N186 End stage renal disease: Secondary | ICD-10-CM | POA: Diagnosis not present

## 2017-05-31 DIAGNOSIS — Z23 Encounter for immunization: Secondary | ICD-10-CM | POA: Diagnosis not present

## 2017-05-31 DIAGNOSIS — D509 Iron deficiency anemia, unspecified: Secondary | ICD-10-CM | POA: Diagnosis not present

## 2017-06-03 DIAGNOSIS — N2581 Secondary hyperparathyroidism of renal origin: Secondary | ICD-10-CM | POA: Diagnosis not present

## 2017-06-03 DIAGNOSIS — Z23 Encounter for immunization: Secondary | ICD-10-CM | POA: Diagnosis not present

## 2017-06-03 DIAGNOSIS — N186 End stage renal disease: Secondary | ICD-10-CM | POA: Diagnosis not present

## 2017-06-03 DIAGNOSIS — D509 Iron deficiency anemia, unspecified: Secondary | ICD-10-CM | POA: Diagnosis not present

## 2017-06-03 DIAGNOSIS — D631 Anemia in chronic kidney disease: Secondary | ICD-10-CM | POA: Diagnosis not present

## 2017-06-05 DIAGNOSIS — Z23 Encounter for immunization: Secondary | ICD-10-CM | POA: Diagnosis not present

## 2017-06-05 DIAGNOSIS — N186 End stage renal disease: Secondary | ICD-10-CM | POA: Diagnosis not present

## 2017-06-05 DIAGNOSIS — N2581 Secondary hyperparathyroidism of renal origin: Secondary | ICD-10-CM | POA: Diagnosis not present

## 2017-06-05 DIAGNOSIS — D631 Anemia in chronic kidney disease: Secondary | ICD-10-CM | POA: Diagnosis not present

## 2017-06-05 DIAGNOSIS — D509 Iron deficiency anemia, unspecified: Secondary | ICD-10-CM | POA: Diagnosis not present

## 2017-06-07 DIAGNOSIS — D509 Iron deficiency anemia, unspecified: Secondary | ICD-10-CM | POA: Diagnosis not present

## 2017-06-07 DIAGNOSIS — N2581 Secondary hyperparathyroidism of renal origin: Secondary | ICD-10-CM | POA: Diagnosis not present

## 2017-06-07 DIAGNOSIS — D631 Anemia in chronic kidney disease: Secondary | ICD-10-CM | POA: Diagnosis not present

## 2017-06-07 DIAGNOSIS — N186 End stage renal disease: Secondary | ICD-10-CM | POA: Diagnosis not present

## 2017-06-07 DIAGNOSIS — Z23 Encounter for immunization: Secondary | ICD-10-CM | POA: Diagnosis not present

## 2017-06-10 DIAGNOSIS — D509 Iron deficiency anemia, unspecified: Secondary | ICD-10-CM | POA: Diagnosis not present

## 2017-06-10 DIAGNOSIS — N186 End stage renal disease: Secondary | ICD-10-CM | POA: Diagnosis not present

## 2017-06-10 DIAGNOSIS — N2581 Secondary hyperparathyroidism of renal origin: Secondary | ICD-10-CM | POA: Diagnosis not present

## 2017-06-10 DIAGNOSIS — D631 Anemia in chronic kidney disease: Secondary | ICD-10-CM | POA: Diagnosis not present

## 2017-06-10 DIAGNOSIS — Z992 Dependence on renal dialysis: Secondary | ICD-10-CM | POA: Diagnosis not present

## 2017-06-10 DIAGNOSIS — I129 Hypertensive chronic kidney disease with stage 1 through stage 4 chronic kidney disease, or unspecified chronic kidney disease: Secondary | ICD-10-CM | POA: Diagnosis not present

## 2017-06-12 DIAGNOSIS — N186 End stage renal disease: Secondary | ICD-10-CM | POA: Diagnosis not present

## 2017-06-12 DIAGNOSIS — D631 Anemia in chronic kidney disease: Secondary | ICD-10-CM | POA: Diagnosis not present

## 2017-06-12 DIAGNOSIS — D509 Iron deficiency anemia, unspecified: Secondary | ICD-10-CM | POA: Diagnosis not present

## 2017-06-12 DIAGNOSIS — N2581 Secondary hyperparathyroidism of renal origin: Secondary | ICD-10-CM | POA: Diagnosis not present

## 2017-06-14 DIAGNOSIS — N2581 Secondary hyperparathyroidism of renal origin: Secondary | ICD-10-CM | POA: Diagnosis not present

## 2017-06-14 DIAGNOSIS — D509 Iron deficiency anemia, unspecified: Secondary | ICD-10-CM | POA: Diagnosis not present

## 2017-06-14 DIAGNOSIS — D631 Anemia in chronic kidney disease: Secondary | ICD-10-CM | POA: Diagnosis not present

## 2017-06-14 DIAGNOSIS — N186 End stage renal disease: Secondary | ICD-10-CM | POA: Diagnosis not present

## 2017-06-17 DIAGNOSIS — N186 End stage renal disease: Secondary | ICD-10-CM | POA: Diagnosis not present

## 2017-06-17 DIAGNOSIS — D631 Anemia in chronic kidney disease: Secondary | ICD-10-CM | POA: Diagnosis not present

## 2017-06-17 DIAGNOSIS — D509 Iron deficiency anemia, unspecified: Secondary | ICD-10-CM | POA: Diagnosis not present

## 2017-06-17 DIAGNOSIS — N2581 Secondary hyperparathyroidism of renal origin: Secondary | ICD-10-CM | POA: Diagnosis not present

## 2017-06-18 ENCOUNTER — Encounter: Payer: Self-pay | Admitting: Neurology

## 2017-06-18 ENCOUNTER — Ambulatory Visit (INDEPENDENT_AMBULATORY_CARE_PROVIDER_SITE_OTHER): Payer: Medicare Other | Admitting: Neurology

## 2017-06-18 VITALS — BP 172/68 | HR 85 | Ht 70.0 in | Wt 202.0 lb

## 2017-06-18 DIAGNOSIS — E8581 Light chain (AL) amyloidosis: Secondary | ICD-10-CM | POA: Diagnosis not present

## 2017-06-18 DIAGNOSIS — M5417 Radiculopathy, lumbosacral region: Secondary | ICD-10-CM | POA: Diagnosis not present

## 2017-06-18 DIAGNOSIS — G629 Polyneuropathy, unspecified: Secondary | ICD-10-CM

## 2017-06-18 NOTE — Progress Notes (Signed)
NEUROLOGY FOLLOW UP OFFICE NOTE  Jose Garrett [id number] Aug 14, 1950  HISTORY OF PRESENT ILLNESS: I had the pleasure of seeing Jose Garrett in follow-up in the neurology clinic on 06/18/2017.  The patient was last seen 3 months ago for worsening neuropathy. Records and images were personally reviewed where available.  His EMG/NCV of both lower extremities showed severe chronic symmetric axonal sensorimotor polyneuropathy, as well as a superimposed L3-4 radiculopathy in both lower extremities, worse on the right. He initially declined PT, but is interested in maybe pursuing it now because of pain going back his legs and making him feel weak. He continues to have feet pain, the pads of both feet are totally numb. Pain is in the arches and dorsum of both feet. Walking helps some. He is on a low dose of gabapentin 300mg  BID and feels tired and lethargic. He is also taking amitriptyline 50mg  qhs. No falls.   HPI 03/07/2017: This is a very pleasant 67 yo RH man with a history of AL amyloidosis with kidney involvement and nephrotic range proteinuria. He has had peripheral neuropathy from previous Velcade/vitamin B12 deficiency/hemodialysis which has worsened slightly, and has also had back issues. He is being referred for further evaluation of symptoms to rule out radiculopathy contributing to symptoms and consideration for EMG/NCS. He is currently on Ixazomib maintenance therapy every 2 weeks. He reports that the neuropathy has changed some since the Fall. He still gets numbness on the bottom of his feet, feeling like he is walking on a hot sidewalk when barefoot. He has been having quick stabbing pains that make him exclaim out loud, but now symptoms are a little more pervasive like he has a bruise, and any position he may be in he would "get a hit" for a second or two. He has constant pins and needles in his feet. He has had a few close calls with falls but has not fully fallen down. He continues to drive  without any weakness noted. His hands are not affected. He does have back pain, with occasional shooting pain from his back down his legs, but they do not radiate to his feet. He has trouble with sleep maintenance due to pain in his feet waking him up. He has been on low dose gabapentin 250mg  qhs (takes the liquid 250mg /54mL formulation) for the past 2 years, and started amitriptyline 50mg  qhs last summer. Taking gabapentin BID caused drowsiness. The medications do help some but "do not help enough." The pain got so bad around Thanksgiving that it started to "affect my mental health." He denies any headaches, dizziness, diplopia, dysarthria/dysphagia, neck pain, bowel/bladder dysfunction except for occasional constipation.    PAST MEDICAL HISTORY: Past Medical History:  Diagnosis Date  . Amyloidosis ([place])   . Anemia Dx 2016  . Chronic diastolic CHF (congestive heart failure), NYHA class 1 ([place])    /hospital problem list 09/08/2014  . Chronic renal disease, stage III ([place])    /hospital problem list 09/08/2014  . Diarrhea   . Dyspnea    with exertion  . Enlarged heart 2015  . Family history of adverse reaction to anesthesia    "my daughter can't take certain anesthesia agents" (09/08/2014)  . GERD (gastroesophageal reflux disease)   . History of blood transfusion 09/08/2014   "got hematoma after renal biopsy & HgB dropped"  . Hyperlipidemia Dx 2012  . Hypertension Dx 2012  . Renal disorder    "there is a spot on my kidney" (09/08/2014)  . Restless  legs     MEDICATIONS: Current Outpatient Medications on File Prior to Visit  Medication Sig Dispense Refill  . acetaminophen (TYLENOL) 325 MG tablet Take 325-650 mg by mouth every 6 (six) hours as needed (back pain).    [name] acyclovir (ZOVIRAX) 400 MG tablet TAKE 1 TABLET BY MOUTH DAILY. (DOSE ADJUSTED FOR RENAL FUNCTION) 60 tablet 2  . amitriptyline (ELAVIL) 50 MG tablet Take 50 mg by mouth at bedtime.    [name] aspirin EC 81 MG tablet Take 1 tablet (81  mg total) by mouth daily. 30 tablet 11  . B Complex-C-Folic Acid (DIALYVITE 194) 0.8 MG TABS Take 0.8 mg by mouth daily.    . benzonatate (TESSALON) 100 MG capsule Take 1 capsule (100 mg total) by mouth every 8 (eight) hours. 21 capsule 0  . cetirizine (ZYRTEC) 10 MG tablet Take 10 mg by mouth daily.    . Cholecalciferol (VITAMIN D3) 3000 units TABS Take 3,000 Units by mouth daily.    . cyanocobalamin 2000 MCG tablet Take 2,500 mcg by mouth daily.    [name] Epoetin Alfa (PROCRIT IJ) Inject as directed every 14 (fourteen) days. Done at [facility] Last injection 01/19/16    . FLONASE 50 MCG/ACT nasal spray Place 1 spray into both nostrils daily as needed. (Patient taking differently: Place 1 spray into both nostrils daily as needed for allergies or rhinitis. )    . gabapentin (NEURONTIN) 300 MG capsule 300mg  po qAM and 600mg  po qHS 90 capsule 1  . ipratropium (ATROVENT) 0.06 % nasal spray Place 2 sprays into both nostrils 4 (four) times daily. 15 mL 0  . lanthanum (FOSRENOL) 1000 MG chewable tablet Chew 1,000 mg by mouth 2 (two) times daily with a meal.    . LORazepam (ATIVAN) 1 MG tablet Take 1 tablet (1 mg total) by mouth daily as needed for anxiety (nausea). 30 tablet 0  . multivitamin (RENA-VIT) TABS tablet Take 1 tablet by mouth daily.    [name] oxyCODONE (OXY IR/ROXICODONE) 5 MG immediate release tablet Take 1-2 tablets (5-10 mg total) by mouth every 6 (six) hours as needed for severe pain. 60 tablet 0  . VELPHORO 500 MG chewable tablet Chew by mouth daily.  10   No current facility-administered medications on file prior to visit.     ALLERGIES: Allergies  Allergen Reactions  . No Known Allergies     FAMILY HISTORY: Family History  Problem Relation Age of Onset  . Hypertension Mother   . Diabetes Mother   . Heart disease Mother   . Skin cancer Mother   . Hypertension Father   . Diabetes Father   . Diabetes Sister     SOCIAL HISTORY: Social History   Socioeconomic History  .  Marital status: Divorced    Spouse name: Not on file  . Number of children: Not on file  . Years of education: Not on file  . Highest education level: Not on file  Occupational History  . Not on file  Social Needs  . Financial resource strain: Not on file  . Food insecurity:    Worry: Not on file    Inability: Not on file  . Transportation needs:    Medical: Not on file    Non-medical: Not on file  Tobacco Use  . Smoking status: Former Smoker    Packs/day: 2.00    Years: 40.00    Pack years: 80.00    Types: Cigarettes    Last attempt to quit: 03/19/2011  Years since quitting: 6.2  . Smokeless tobacco: Never Used  Substance and Sexual Activity  . Alcohol use: No    Alcohol/week: 0.0 oz  . Drug use: No    Comment: 09/08/2014 "I have used marijuana till the 1990's". Notes that he quit 15 yrs ago  . Sexual activity: Not Currently  Lifestyle  . Physical activity:    Days per week: Not on file    Minutes per session: Not on file  . Stress: Not on file  Relationships  . Social connections:    Talks on phone: Not on file    Gets together: Not on file    Attends religious service: Not on file    Active member of club or organization: Not on file    Attends meetings of clubs or organizations: Not on file    Relationship status: Not on file  . Intimate partner violence:    Fear of current or ex partner: Not on file    Emotionally abused: Not on file    Physically abused: Not on file    Forced sexual activity: Not on file  Other Topics Concern  . 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     REVIEW OF SYSTEMS: Constitutional: No fevers, chills, or sweats, no generalized fatigue, change in appetite Eyes: No visual changes, double vision, eye pain Ear, nose and throat: No hearing loss, ear pain, nasal congestion, sore  throat Cardiovascular: No chest pain, palpitations Respiratory:  No shortness of breath at rest or with exertion, wheezes GastrointestinaI: No nausea, vomiting, diarrhea, abdominal pain, fecal incontinence Genitourinary:  No dysuria, urinary retention or frequency Musculoskeletal:  No neck pain,+ back pain Integumentary: No rash, pruritus, skin lesions Neurological: as above Psychiatric: No depression, insomnia, anxiety Endocrine: No palpitations, fatigue, diaphoresis, mood swings, change in appetite, change in weight, increased thirst Hematologic/Lymphatic:  No anemia, purpura, petechiae. Allergic/Immunologic: no itchy/runny eyes, nasal congestion, recent allergic reactions, rashes  PHYSICAL EXAM: Vitals:   06/18/17 1544  BP: (!) 172/68  Pulse: 85  SpO2: 95%   General: No acute distress Head:  Normocephalic/atraumatic Neck: supple, no paraspinal tenderness, full range of motion Heart:  Regular rate and rhythm Lungs:  Clear to auscultation bilaterally Back: No paraspinal tenderness Skin/Extremities: No rash, no edema Neurological Exam: alert and oriented to person, place, and time. No aphasia or dysarthria. Fund of knowledge is appropriate.  Recent and remote memory are intact.  Attention and concentration are normal.    Able to name objects and repeat phrases. Cranial nerves: Pupils equal, round, reactive to light.  Fundoscopic exam unremarkable, no papilledema. Extraocular movements intact with no nystagmus. Visual fields full. Facial sensation intact. No facial asymmetry. Tongue, uvula, palate midline.  Motor: Bulk and tone normal, muscle strength 5/5 throughout with no pronator drift.  Sensation decreased in both feet.  Deep tendon reflexes 2+ except for absent ankle jerks bilaterally, toes downgoing.  Finger to nose testing intact.  Gait slow and cautious with cane, no ataxia. Romberg positive sway.  IMPRESSION: This is a very pleasant 67 yo RH man with a history of  AL amyloidosis  with kidney involvement and nephrotic range proteinuria. He has had peripheral neuropathy from previous Velcade/vitamin B12 deficiency/hemodialysis and has been reporting worsening of symptoms. His EMG/ NCV confirmed severe bilateral neuropathy, with superimposed bilateral L3-4 radiculopathy,  R>L. He is considering PT more for balance therapy and radiculopathy, and will let us know when he would like referral sent. He is interested in back injections with his doctor. We discussed that a lot of neuropathic pain medications can cause sedation, but he is agreeable to increasing gabapentin to 1 cap in AM, 2 caps in PM, continue amitriptyline 50mg  qhs. He will try and see if alpha-lipoic acid is helpful. He will follow-up in 6 months and knows to call for any changes.   Thank you for allowing me to participate in his care.  Please do not hesitate to call for any questions or concerns.  The duration of this appointment visit was 25 minutes of face-to-face time with the patient.  Greater than 50% of this time was spent in counseling, explanation of diagnosis, planning of further management, and coordination of care.   Ellouise Newer, M.D.   CC: Dr. Adrian Blackwater, Dr. [name]

## 2017-06-18 NOTE — Patient Instructions (Signed)
1. Increase gabapentin 300mg : Take 1 cap in AM, 2 caps in PM 2. Continue amitriptyline 50mg : 1 tablet every night 3. Try alpha-lipoic acid 600mg  daily and see if this helps 4. Consider physical therapy for the pinched nerve in your back, or try back injections 5. Follow-up in 6 months, call for any changes

## 2017-06-19 DIAGNOSIS — D631 Anemia in chronic kidney disease: Secondary | ICD-10-CM | POA: Diagnosis not present

## 2017-06-19 DIAGNOSIS — N186 End stage renal disease: Secondary | ICD-10-CM | POA: Diagnosis not present

## 2017-06-19 DIAGNOSIS — N2581 Secondary hyperparathyroidism of renal origin: Secondary | ICD-10-CM | POA: Diagnosis not present

## 2017-06-19 DIAGNOSIS — D509 Iron deficiency anemia, unspecified: Secondary | ICD-10-CM | POA: Diagnosis not present

## 2017-06-20 ENCOUNTER — Other Ambulatory Visit: Payer: Self-pay

## 2017-06-20 ENCOUNTER — Emergency Department (HOSPITAL_COMMUNITY)
Admission: EM | Admit: 2017-06-20 | Discharge: 2017-06-20 | Disposition: A | Discharge status: Home / Self Care | Payer: Medicare Other | Attending: Emergency Medicine | Admitting: Emergency Medicine

## 2017-06-20 ENCOUNTER — Encounter (HOSPITAL_COMMUNITY): Payer: Self-pay | Admitting: *Deleted

## 2017-06-20 DIAGNOSIS — Z87891 Personal history of nicotine dependence: Secondary | ICD-10-CM | POA: Insufficient documentation

## 2017-06-20 DIAGNOSIS — T8249XA Other complication of vascular dialysis catheter, initial encounter: Secondary | ICD-10-CM | POA: Insufficient documentation

## 2017-06-20 DIAGNOSIS — T829XXA Unspecified complication of cardiac and vascular prosthetic device, implant and graft, initial encounter: Secondary | ICD-10-CM

## 2017-06-20 DIAGNOSIS — Z79899 Other long term (current) drug therapy: Secondary | ICD-10-CM | POA: Diagnosis not present

## 2017-06-20 DIAGNOSIS — N186 End stage renal disease: Secondary | ICD-10-CM | POA: Insufficient documentation

## 2017-06-20 DIAGNOSIS — Y829 Unspecified medical devices associated with adverse incidents: Secondary | ICD-10-CM | POA: Insufficient documentation

## 2017-06-20 DIAGNOSIS — I132 Hypertensive heart and chronic kidney disease with heart failure and with stage 5 chronic kidney disease, or end stage renal disease: Secondary | ICD-10-CM | POA: Insufficient documentation

## 2017-06-20 DIAGNOSIS — Z992 Dependence on renal dialysis: Secondary | ICD-10-CM | POA: Diagnosis not present

## 2017-06-20 DIAGNOSIS — Z7982 Long term (current) use of aspirin: Secondary | ICD-10-CM | POA: Insufficient documentation

## 2017-06-20 DIAGNOSIS — I5032 Chronic diastolic (congestive) heart failure: Secondary | ICD-10-CM | POA: Diagnosis not present

## 2017-06-20 DIAGNOSIS — E785 Hyperlipidemia, unspecified: Secondary | ICD-10-CM | POA: Insufficient documentation

## 2017-06-20 NOTE — ED Triage Notes (Signed)
Pt in c/o continued bleeding from his dialysis access site, states he normally has some slight bleeding after his treatment but this has lasted much longer, bleeding controlled at this time

## 2017-06-20 NOTE — ED Provider Notes (Signed)
[facility] EMERGENCY DEPARTMENT Provider Note   CSN: [id number] Arrival date & time: 06/20/17  1556     History   Chief Complaint Chief Complaint  Patient presents with  . Vascular Access Problem    HPI Jose Garrett is a 67 y.o. male.  HPI Patient is a 67 year old male presents emergency department with bleeding from his right forearm dialysis access site.  He is scheduled for dialysis again tomorrow.  He was able to control the bleeding with a Band-Aid.  He has no other complaints at this time.  No lightheadedness or weakness.  No longer is bleeding from the right forearm fistula.  No recent injury or trauma to the area.   Past Medical History:  Diagnosis Date  . Amyloidosis ([place])   . Anemia Dx 2016  . Chronic diastolic CHF (congestive heart failure), NYHA class 1 ([place])    /hospital problem list 09/08/2014  . Chronic renal disease, stage III ([place])    /hospital problem list 09/08/2014  . Diarrhea   . Dyspnea    with exertion  . Enlarged heart 2015  . Family history of adverse reaction to anesthesia    "my daughter can't take certain anesthesia agents" (09/08/2014)  . GERD (gastroesophageal reflux disease)   . History of blood transfusion 09/08/2014   "got hematoma after renal biopsy & HgB dropped"  . Hyperlipidemia Dx 2012  . Hypertension Dx 2012  . Renal disorder    "there is a spot on my kidney" (09/08/2014)  . Restless legs     Patient Active Problem List   Diagnosis Date Noted  . Neuropathy 03/14/2017  . Fever   . CKD (chronic kidney disease) stage 5, GFR less than 15 ml/min (HCC)   . ESRD (end stage renal disease) ([place])   . Acute renal failure superimposed on chronic kidney disease ([place])   . Hypervolemia   . Acute on chronic diastolic CHF (congestive heart failure) ([place]) 01/23/2016  . Fluid overload, unspecified 01/23/2016  . Acute pulmonary edema ([place]) 01/23/2016  . Skin tear of right forearm without complication 98/92/1194  . Iron  deficiency anemia 11/22/2015  . Mild aortic stenosis 08/22/2015  . Chronic kidney disease with dialysis modality undecided, stage 5 ([place]): Progressive 08/01/2015  . B12 deficiency 07/19/2015  . Seasonal allergies 06/30/2015  . Back pain 03/18/2015  . Hip pain 02/18/2015  . AL amyloidosis ([place]) 09/25/2014  . Anemia of chronic disease 09/25/2014  . Abnormal bruising 09/25/2014  . Renal hematoma, right 09/08/2014  . Chronic diastolic heart failure, NYHA class 1 ([place]) 09/08/2014  . Hematuria   . Proteinuria   . Vitamin D deficiency 01/21/2014  . Hypertriglyceridemia 01/21/2014  . Essential hypertension, benign 11/06/2013  . Dependent edema 11/06/2013  . DOE (dyspnea on exertion) 11/06/2013  . Other malaise and fatigue 11/06/2013    Past Surgical History:  Procedure Laterality Date  . AV FISTULA PLACEMENT Right 12/27/2015   Procedure: Right Arm ARTERIOVENOUS (AV) FISTULA CREATION;  Surgeon: Angelia Mould, MD;  Location: [facility];  Service: Vascular;  Laterality: Right;  . COLONOSCOPY    . IR GENERIC HISTORICAL  01/27/2016   IR FLUORO GUIDE CV LINE RIGHT 01/27/2016 Arne Cleveland, MD [facility]  . IR GENERIC HISTORICAL  01/27/2016   IR US GUIDE VASC ACCESS RIGHT 01/27/2016 Arne Cleveland, MD [facility]  . LAPAROSCOPIC CHOLECYSTECTOMY  03/2011  . RENAL BIOPSY, PERCUTANEOUS Right 09/08/2014  . TONSILLECTOMY  ~ 1960  Home Medications    Prior to Admission medications   Medication Sig Start Date End Date Taking? Authorizing Provider  acetaminophen (TYLENOL) 325 MG tablet Take 325-650 mg by mouth every 6 (six) hours as needed (back pain).    [provider]  acyclovir (ZOVIRAX) 400 MG tablet TAKE 1 TABLET BY MOUTH DAILY. (DOSE ADJUSTED FOR RENAL FUNCTION) 02/26/17   [name], MD  amitriptyline (ELAVIL) 50 MG tablet Take 50 mg by mouth at bedtime.    [provider]  aspirin EC 81 MG tablet Take 1 tablet (81 mg total) by mouth daily.  01/14/15   [name], MD  B Complex-C-Folic Acid (DIALYVITE 502) 0.8 MG TABS Take 0.8 mg by mouth daily.    [provider]  benzonatate (TESSALON) 100 MG capsule Take 1 capsule (100 mg total) by mouth every 8 (eight) hours. 03/19/17   Tasia Catchings, Amy V, PA-C  cetirizine (ZYRTEC) 10 MG tablet Take 10 mg by mouth daily.    [provider]  Cholecalciferol (VITAMIN D3) 3000 units TABS Take 3,000 Units by mouth daily.    [provider]  cyanocobalamin 2000 MCG tablet Take 2,500 mcg by mouth daily.    [provider]  Epoetin Alfa (PROCRIT IJ) Inject as directed every 14 (fourteen) days. Done at [facility] Last injection 01/19/16    [provider]  FLONASE 50 MCG/ACT nasal spray Place 1 spray into both nostrils daily as needed. Patient taking differently: Place 1 spray into both nostrils daily as needed for allergies or rhinitis.  12/27/15   Alvia Grove, PA-C  gabapentin (NEURONTIN) 300 MG capsule 300mg  po qAM and 600mg  po qHS 05/14/17   [name], MD  ipratropium (ATROVENT) 0.06 % nasal spray Place 2 sprays into both nostrils 4 (four) times daily. 03/19/17   Tasia Catchings, Amy V, PA-C  lanthanum (FOSRENOL) 1000 MG chewable tablet Chew 1,000 mg by mouth 2 (two) times daily with a meal.    [provider]  LORazepam (ATIVAN) 1 MG tablet Take 1 tablet (1 mg total) by mouth daily as needed for anxiety (nausea). 01/08/17   [name], MD  multivitamin (RENA-VIT) TABS tablet Take 1 tablet by mouth daily.    [provider]  oxyCODONE (OXY IR/ROXICODONE) 5 MG immediate release tablet Take 1-2 tablets (5-10 mg total) by mouth every 6 (six) hours as needed for severe pain. 05/14/17   [name], MD  VELPHORO 500 MG chewable tablet Chew by mouth daily. 12/28/16   [provider]    Family History Family History  Problem Relation Age of Onset  . Hypertension Mother   . Diabetes Mother   . Heart disease  Mother   . Skin cancer Mother   . Hypertension Father   . Diabetes Father   . Diabetes Sister     Social History Social History   Tobacco Use  . Smoking status: Former Smoker    Packs/day: 2.00    Years: 40.00    Pack years: 80.00    Types: Cigarettes    Last attempt to quit: 03/19/2011    Years since quitting: 6.2  . Smokeless tobacco: Never Used  Substance Use Topics  . Alcohol use: No    Alcohol/week: 0.0 oz  . Drug use: No    Comment: 09/08/2014 "I have used marijuana till the 1990's". Notes that he quit 15 yrs ago     Allergies   No known allergies   Review  of Systems Review of Systems  All other systems reviewed and are negative.    Physical Exam Updated Vital Signs BP (!) 177/75 (BP Location: Left Arm)   Pulse 88   Temp 98.1 F (36.7 C) (Oral)   Resp 16   SpO2 97%   Physical Exam  Constitutional: He is oriented to person, place, and time. He appears well-developed and well-nourished.  HENT:  Head: Normocephalic.  Eyes: EOM are normal.  Neck: Normal range of motion.  Pulmonary/Chest: Effort normal.  Abdominal: He exhibits no distension.  Musculoskeletal: Normal range of motion.  Good thrill in right distal forearm.  No active bleeding from the right forearm fistula at this time.  He has a small Band-Aid overlying the area that was bleeding.  There is no surrounding blood or erythema or warmth.  Neurological: He is alert and oriented to person, place, and time.  Psychiatric: He has a normal mood and affect.  Nursing note and vitals reviewed.    ED Treatments / Results  Labs (all labs ordered are listed, but only abnormal results are displayed) Labs Reviewed - No data to display  EKG None  Radiology No results found.  Procedures Procedures (including critical care time)  Medications Ordered in ED Medications - No data to display   Initial Impression / Assessment and Plan / ED Course  I have reviewed the triage vital signs and the  nursing notes.  Pertinent labs & imaging results that were available during my care of the patient were reviewed by me and considered in my medical decision making (see chart for details).     The patient has no evidence of active bleeding at this time.  He was observed in the emergency department for some time and has had no recurrence of his bleeding.  He is scheduled for dialysis in the morning.  I will have him keep the Band-Aid on until then and follow-up with his dialysis team.  Final Clinical Impressions(s) / ED Diagnoses   Final diagnoses:  Complication from renal dialysis device, initial encounter    ED Discharge Orders    None       Jola Schmidt, MD 06/20/17 2342

## 2017-06-20 NOTE — ED Notes (Signed)
ED Provider at bedside. 

## 2017-06-21 DIAGNOSIS — D631 Anemia in chronic kidney disease: Secondary | ICD-10-CM | POA: Diagnosis not present

## 2017-06-21 DIAGNOSIS — N186 End stage renal disease: Secondary | ICD-10-CM | POA: Diagnosis not present

## 2017-06-21 DIAGNOSIS — D509 Iron deficiency anemia, unspecified: Secondary | ICD-10-CM | POA: Diagnosis not present

## 2017-06-21 DIAGNOSIS — N2581 Secondary hyperparathyroidism of renal origin: Secondary | ICD-10-CM | POA: Diagnosis not present

## 2017-06-24 ENCOUNTER — Encounter: Payer: Self-pay | Admitting: Neurology

## 2017-06-24 DIAGNOSIS — N186 End stage renal disease: Secondary | ICD-10-CM | POA: Diagnosis not present

## 2017-06-24 DIAGNOSIS — N2581 Secondary hyperparathyroidism of renal origin: Secondary | ICD-10-CM | POA: Diagnosis not present

## 2017-06-24 DIAGNOSIS — D509 Iron deficiency anemia, unspecified: Secondary | ICD-10-CM | POA: Diagnosis not present

## 2017-06-24 DIAGNOSIS — D631 Anemia in chronic kidney disease: Secondary | ICD-10-CM | POA: Diagnosis not present

## 2017-06-26 DIAGNOSIS — D509 Iron deficiency anemia, unspecified: Secondary | ICD-10-CM | POA: Diagnosis not present

## 2017-06-26 DIAGNOSIS — D631 Anemia in chronic kidney disease: Secondary | ICD-10-CM | POA: Diagnosis not present

## 2017-06-26 DIAGNOSIS — N2581 Secondary hyperparathyroidism of renal origin: Secondary | ICD-10-CM | POA: Diagnosis not present

## 2017-06-26 DIAGNOSIS — N186 End stage renal disease: Secondary | ICD-10-CM | POA: Diagnosis not present

## 2017-06-28 DIAGNOSIS — N2581 Secondary hyperparathyroidism of renal origin: Secondary | ICD-10-CM | POA: Diagnosis not present

## 2017-06-28 DIAGNOSIS — N186 End stage renal disease: Secondary | ICD-10-CM | POA: Diagnosis not present

## 2017-06-28 DIAGNOSIS — D631 Anemia in chronic kidney disease: Secondary | ICD-10-CM | POA: Diagnosis not present

## 2017-06-28 DIAGNOSIS — D509 Iron deficiency anemia, unspecified: Secondary | ICD-10-CM | POA: Diagnosis not present

## 2017-07-01 DIAGNOSIS — N186 End stage renal disease: Secondary | ICD-10-CM | POA: Diagnosis not present

## 2017-07-01 DIAGNOSIS — D631 Anemia in chronic kidney disease: Secondary | ICD-10-CM | POA: Diagnosis not present

## 2017-07-01 DIAGNOSIS — N2581 Secondary hyperparathyroidism of renal origin: Secondary | ICD-10-CM | POA: Diagnosis not present

## 2017-07-01 DIAGNOSIS — D509 Iron deficiency anemia, unspecified: Secondary | ICD-10-CM | POA: Diagnosis not present

## 2017-07-03 DIAGNOSIS — D631 Anemia in chronic kidney disease: Secondary | ICD-10-CM | POA: Diagnosis not present

## 2017-07-03 DIAGNOSIS — D509 Iron deficiency anemia, unspecified: Secondary | ICD-10-CM | POA: Diagnosis not present

## 2017-07-03 DIAGNOSIS — N2581 Secondary hyperparathyroidism of renal origin: Secondary | ICD-10-CM | POA: Diagnosis not present

## 2017-07-03 DIAGNOSIS — N186 End stage renal disease: Secondary | ICD-10-CM | POA: Diagnosis not present

## 2017-07-05 DIAGNOSIS — N2581 Secondary hyperparathyroidism of renal origin: Secondary | ICD-10-CM | POA: Diagnosis not present

## 2017-07-05 DIAGNOSIS — N186 End stage renal disease: Secondary | ICD-10-CM | POA: Diagnosis not present

## 2017-07-05 DIAGNOSIS — D631 Anemia in chronic kidney disease: Secondary | ICD-10-CM | POA: Diagnosis not present

## 2017-07-05 DIAGNOSIS — D509 Iron deficiency anemia, unspecified: Secondary | ICD-10-CM | POA: Diagnosis not present

## 2017-07-08 DIAGNOSIS — N2581 Secondary hyperparathyroidism of renal origin: Secondary | ICD-10-CM | POA: Diagnosis not present

## 2017-07-08 DIAGNOSIS — D509 Iron deficiency anemia, unspecified: Secondary | ICD-10-CM | POA: Diagnosis not present

## 2017-07-08 DIAGNOSIS — D631 Anemia in chronic kidney disease: Secondary | ICD-10-CM | POA: Diagnosis not present

## 2017-07-08 DIAGNOSIS — N186 End stage renal disease: Secondary | ICD-10-CM | POA: Diagnosis not present

## 2017-07-10 ENCOUNTER — Telehealth: Payer: Self-pay

## 2017-07-10 DIAGNOSIS — D631 Anemia in chronic kidney disease: Secondary | ICD-10-CM | POA: Diagnosis not present

## 2017-07-10 DIAGNOSIS — N2581 Secondary hyperparathyroidism of renal origin: Secondary | ICD-10-CM | POA: Diagnosis not present

## 2017-07-10 DIAGNOSIS — N186 End stage renal disease: Secondary | ICD-10-CM | POA: Diagnosis not present

## 2017-07-10 DIAGNOSIS — Z992 Dependence on renal dialysis: Secondary | ICD-10-CM | POA: Diagnosis not present

## 2017-07-10 DIAGNOSIS — D509 Iron deficiency anemia, unspecified: Secondary | ICD-10-CM | POA: Diagnosis not present

## 2017-07-10 DIAGNOSIS — I129 Hypertensive chronic kidney disease with stage 1 through stage 4 chronic kidney disease, or unspecified chronic kidney disease: Secondary | ICD-10-CM | POA: Diagnosis not present

## 2017-07-10 NOTE — Telephone Encounter (Signed)
Spoke with patient concerning upcoming appointment being r/s. Patient had a requested day, and was scheduled around that. Per 5/1 in basket

## 2017-07-10 NOTE — Telephone Encounter (Signed)
Pt called requesting reschedule for appt 07/11/17 as he has been scheduled for an outpatient procedure through his kidney doctor. High priority scheduling message sent to cancel 07/11/17 appt and request to call pt and reschedule lab and doctor f/u.

## 2017-07-11 ENCOUNTER — Other Ambulatory Visit: Payer: Medicare Other

## 2017-07-11 ENCOUNTER — Ambulatory Visit: Payer: Medicare Other | Admitting: Hematology

## 2017-07-11 DIAGNOSIS — Z992 Dependence on renal dialysis: Secondary | ICD-10-CM | POA: Diagnosis not present

## 2017-07-11 DIAGNOSIS — T82858A Stenosis of vascular prosthetic devices, implants and grafts, initial encounter: Secondary | ICD-10-CM | POA: Diagnosis not present

## 2017-07-11 DIAGNOSIS — N186 End stage renal disease: Secondary | ICD-10-CM | POA: Diagnosis not present

## 2017-07-11 DIAGNOSIS — I871 Compression of vein: Secondary | ICD-10-CM | POA: Diagnosis not present

## 2017-07-12 DIAGNOSIS — D509 Iron deficiency anemia, unspecified: Secondary | ICD-10-CM | POA: Diagnosis not present

## 2017-07-12 DIAGNOSIS — N186 End stage renal disease: Secondary | ICD-10-CM | POA: Diagnosis not present

## 2017-07-12 DIAGNOSIS — N2581 Secondary hyperparathyroidism of renal origin: Secondary | ICD-10-CM | POA: Diagnosis not present

## 2017-07-12 DIAGNOSIS — D631 Anemia in chronic kidney disease: Secondary | ICD-10-CM | POA: Diagnosis not present

## 2017-07-15 DIAGNOSIS — D509 Iron deficiency anemia, unspecified: Secondary | ICD-10-CM | POA: Diagnosis not present

## 2017-07-15 DIAGNOSIS — D631 Anemia in chronic kidney disease: Secondary | ICD-10-CM | POA: Diagnosis not present

## 2017-07-15 DIAGNOSIS — N186 End stage renal disease: Secondary | ICD-10-CM | POA: Diagnosis not present

## 2017-07-15 DIAGNOSIS — N2581 Secondary hyperparathyroidism of renal origin: Secondary | ICD-10-CM | POA: Diagnosis not present

## 2017-07-16 ENCOUNTER — Other Ambulatory Visit: Payer: Medicare Other

## 2017-07-16 ENCOUNTER — Ambulatory Visit: Payer: Medicare Other | Admitting: Hematology

## 2017-07-17 DIAGNOSIS — D509 Iron deficiency anemia, unspecified: Secondary | ICD-10-CM | POA: Diagnosis not present

## 2017-07-17 DIAGNOSIS — N2581 Secondary hyperparathyroidism of renal origin: Secondary | ICD-10-CM | POA: Diagnosis not present

## 2017-07-17 DIAGNOSIS — D631 Anemia in chronic kidney disease: Secondary | ICD-10-CM | POA: Diagnosis not present

## 2017-07-17 DIAGNOSIS — N186 End stage renal disease: Secondary | ICD-10-CM | POA: Diagnosis not present

## 2017-07-19 DIAGNOSIS — N2581 Secondary hyperparathyroidism of renal origin: Secondary | ICD-10-CM | POA: Diagnosis not present

## 2017-07-19 DIAGNOSIS — D509 Iron deficiency anemia, unspecified: Secondary | ICD-10-CM | POA: Diagnosis not present

## 2017-07-19 DIAGNOSIS — N186 End stage renal disease: Secondary | ICD-10-CM | POA: Diagnosis not present

## 2017-07-19 DIAGNOSIS — D631 Anemia in chronic kidney disease: Secondary | ICD-10-CM | POA: Diagnosis not present

## 2017-07-22 DIAGNOSIS — D509 Iron deficiency anemia, unspecified: Secondary | ICD-10-CM | POA: Diagnosis not present

## 2017-07-22 DIAGNOSIS — D631 Anemia in chronic kidney disease: Secondary | ICD-10-CM | POA: Diagnosis not present

## 2017-07-22 DIAGNOSIS — N186 End stage renal disease: Secondary | ICD-10-CM | POA: Diagnosis not present

## 2017-07-22 DIAGNOSIS — N2581 Secondary hyperparathyroidism of renal origin: Secondary | ICD-10-CM | POA: Diagnosis not present

## 2017-07-24 DIAGNOSIS — N2581 Secondary hyperparathyroidism of renal origin: Secondary | ICD-10-CM | POA: Diagnosis not present

## 2017-07-24 DIAGNOSIS — N186 End stage renal disease: Secondary | ICD-10-CM | POA: Diagnosis not present

## 2017-07-24 DIAGNOSIS — D631 Anemia in chronic kidney disease: Secondary | ICD-10-CM | POA: Diagnosis not present

## 2017-07-24 DIAGNOSIS — D509 Iron deficiency anemia, unspecified: Secondary | ICD-10-CM | POA: Diagnosis not present

## 2017-07-24 NOTE — Progress Notes (Signed)
.    Hematology/Oncology clinic note  Date of service: 07/25/17  Patient Care Team: [name], MD as PCP - General (Family Medicine) [name], MD as PCP - Hematology/Oncology (Hematology and Oncology) [name], MD as Consulting Physician (Nephrology)  CHIEF COMPLAINTS: followup for continued management of AL amyloidosis.  HISTORY OF PRESENTING ILLNESS: Please see my initial consultation for details on presentation.  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 AL amyloidosis noted on kidney biopsy diagnosed 09/28/2014.    previous treatment  CyBorD x 6 cycles Evaluated for and declined Auto HSCT at [facility]. - maintenance Velcade  Ixazomib + Dexamethasone s/p 6 cycles  Ixazomib maintenance q2weeks  Current treatment  Active surveillance -off Maintenance Ixazomib due to concerns for neuropathy.   INTERVAL HISTORY   [name] is here for his scheduled 2 month follow-up for AL amyloidosis. The patient's last visit with [place] was on 05/14/17. The pt reports that he is doing well overall.   The pt reports that he continues to have bothersome neuropathy and follows up with his neurologist, Dr Narda Amber. He continues to take Gabapentin '600mg'$  daily, Amitriptyline '50mg'$ , and Vitamin B12. He notes that his neuropathy is worst at night time. He notes that the higher dose of Gabapentin has worked better for him but he continues to have very painful episodes. He continues to take 1/2 to 1/4 pill of '5mg'$  Oxycodone as needed, which he notes is about 3 per week on average. He denies having tried Cymbalta in the past. - cannot safely use with his renal function at this time.  He notes that he is continuing to see [facility].   ECHO 05/02/17 revealed no change in the pumping function of his heart but some AL Amyloid related  Subtle changes may be picked up on strain patterns.   Lab results today  (07/25/17) of CBC, CMP, and Reticulocytes is as follows: all values are WNL except for RBC at 3.05, Hgb at 10.8, HCT at 32.2, MCV at 105.6, MCH at 35.4, Platelets at 96k, Chloride at 96, CO2 at 32, glucose at 147, BUN at 33, Creatinine at 7.77. MMP and Kappa/Lambda 07/25/17 are pending.   On review of systems, pt reports painful neuropathy, good energy levels and denies leg swelling, abdominal pains, and any other symptoms.    MEDICAL HISTORY:  Past Medical History:  Diagnosis Date  . Amyloidosis ([place])   . Anemia Dx 2016  . Chronic diastolic CHF (congestive heart failure), NYHA class 1 ([place])    /hospital problem list 09/08/2014  . Chronic renal disease, stage III ([place])    /hospital problem list 09/08/2014  . Diarrhea   . Dyspnea    with exertion  . Enlarged heart 2015  . Family history of adverse reaction to anesthesia    "my daughter can't take certain anesthesia agents" (09/08/2014)  . GERD (gastroesophageal reflux disease)   . History of blood transfusion 09/08/2014   "got hematoma after renal biopsy & HgB dropped"  . Hyperlipidemia Dx 2012  . Hypertension Dx 2012  . Renal disorder    "there is a spot on my kidney" (09/08/2014)  . Restless legs     SURGICAL HISTORY: Past Surgical History:  Procedure Laterality Date  . AV FISTULA PLACEMENT Right 12/27/2015   Procedure: Right Arm ARTERIOVENOUS (AV) FISTULA CREATION;  Surgeon: Angelia Mould, MD;  Location: [facility];  Service: Vascular;  Laterality: Right;  . COLONOSCOPY    .  IR GENERIC HISTORICAL  01/27/2016   IR FLUORO GUIDE CV LINE RIGHT 01/27/2016 Arne Cleveland, MD [facility]  . IR GENERIC HISTORICAL  01/27/2016   IR US GUIDE VASC ACCESS RIGHT 01/27/2016 Arne Cleveland, MD [facility]  . LAPAROSCOPIC CHOLECYSTECTOMY  03/2011  . RENAL BIOPSY, PERCUTANEOUS Right 09/08/2014  . TONSILLECTOMY  ~ 1960    SOCIAL HISTORY: Social History   Socioeconomic History  . Marital status: Divorced    Spouse name: Not on file  .  Number of children: Not on file  . Years of education: Not on file  . Highest education level: Not on file  Occupational History  . Not on file  Social Needs  . Financial resource strain: Not on file  . Food insecurity:    Worry: Not on file    Inability: Not on file  . Transportation needs:    Medical: Not on file    Non-medical: Not on file  Tobacco Use  . Smoking status: Former Smoker    Packs/day: 2.00    Years: 40.00    Pack years: 80.00    Types: Cigarettes    Last attempt to quit: 03/19/2011    Years since quitting: 6.3  . Smokeless tobacco: Never Used  Substance and Sexual Activity  . Alcohol use: No    Alcohol/week: 0.0 oz  . Drug use: No    Comment: 09/08/2014 "I have used marijuana till the 1990's". Notes that he quit 15 yrs ago  . Sexual activity: Not Currently  Lifestyle  . Physical activity:    Days per week: Not on file    Minutes per session: Not on file  . Stress: Not on file  Relationships  . Social connections:    Talks on phone: Not on file    Gets together: Not on file    Attends religious service: Not on file    Active member of club or organization: Not on file    Attends meetings of clubs or organizations: Not on file    Relationship status: Not on file  . Intimate partner violence:    Fear of current or ex partner: Not on file    Emotionally abused: Not on file    Physically abused: Not on file    Forced sexual activity: Not on file  Other Topics Concern  . 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 Lives with his daughter and her finance.  FAMILY HISTORY: Family History  Problem Relation Age of Onset  . Hypertension Mother   . Diabetes Mother   . Heart disease Mother   . Skin cancer Mother   . Hypertension Father   .  Diabetes Father   . Diabetes Sister     ALLERGIES:  is allergic to no known allergies.  MEDICATIONS:  Current Outpatient Medications  Medication Sig Dispense Refill  . acetaminophen (TYLENOL) 325 MG tablet Take 325-650 mg by mouth every 6 (six) hours as needed (back pain).    [name] acyclovir (ZOVIRAX) 400 MG tablet TAKE 1 TABLET BY MOUTH DAILY. (DOSE ADJUSTED FOR RENAL FUNCTION) 60 tablet 2  . amitriptyline (ELAVIL) 50 MG tablet Take 1 tablet (50 mg total) by mouth at bedtime.    [name] aspirin EC 81 MG tablet Take  1 tablet (81 mg total) by mouth daily. 30 tablet 11  . B Complex-C-Folic Acid (DIALYVITE 536) 0.8 MG TABS Take 0.8 mg by mouth daily.    . cetirizine (ZYRTEC) 10 MG tablet Take 10 mg by mouth daily.    . Cholecalciferol (VITAMIN D3) 3000 units TABS Take 3,000 Units by mouth daily.    . cyanocobalamin 2000 MCG tablet Take 2,500 mcg by mouth daily.    [name] Epoetin Alfa (PROCRIT IJ) Inject as directed every 14 (fourteen) days. Done at [facility] Last injection 01/19/16    . gabapentin (NEURONTIN) 300 MG capsule '300mg'$  po qAM and '600mg'$  po qHS 90 capsule 1  . LORazepam (ATIVAN) 1 MG tablet Take 1 tablet (1 mg total) by mouth daily as needed for anxiety (nausea). 30 tablet 0  . multivitamin (RENA-VIT) TABS tablet Take 1 tablet by mouth daily.    [name] oxyCODONE (OXY IR/ROXICODONE) 5 MG immediate release tablet Take 1-2 tablets (5-10 mg total) by mouth every 6 (six) hours as needed for severe pain. 60 tablet 0  . VELPHORO 500 MG chewable tablet Chew by mouth daily.  10  . oxyCODONE (OXYCONTIN) 10 mg 12 hr tablet Take 1 tablet (10 mg total) by mouth at bedtime. 30 tablet 0   No current facility-administered medications for this visit.     REVIEW OF SYSTEMS:   A 10+ POINT REVIEW OF SYSTEMS WAS OBTAINED including neurology, dermatology, psychiatry, cardiac, respiratory, lymph, extremities, GI, GU, Musculoskeletal, constitutional, breasts, reproductive, HEENT.  All pertinent positives are noted in  the HPI.  All others are negative.   PHYSICAL EXAMINATION: ECOG PERFORMANCE STATUS: 2 - Symptomatic, <50% confined to bed  Vitals:   07/25/17 1539  BP: 135/65  Pulse: 93  Resp: 18  Temp: 98.8 F (37.1 C)  SpO2: 97%   Filed Weights   07/25/17 1539  Weight: 204 lb 3.2 oz (92.6 kg)    GENERAL:alert, in no acute distress and comfortable SKIN: no acute rashes, no significant lesions EYES: conjunctiva are pink and non-injected, sclera anicteric OROPHARYNX: MMM, no exudates, no oropharyngeal erythema or ulceration NECK: supple, no JVD LYMPH:  no palpable lymphadenopathy in the cervical, axillary or inguinal regions LUNGS: clear to auscultation b/l with normal respiratory effort HEART: regular rate & rhythm, 2/6 SM over the aortic area ABDOMEN:  normoactive bowel sounds , non tender, not distended. Extremity: no pedal edema PSYCH: alert & oriented x 3 with fluent speech NEURO: no focal motor/sensory deficits   LABORATORY DATA:   . CBC Latest Ref Rng & Units 07/25/2017 05/14/2017 03/14/2017  WBC 4.0 - 10.3 K/uL 6.0 7.5 4.6  Hemoglobin 13.0 - 17.1 g/dL 10.8(L) 11.4(L) 10.8(L)  Hematocrit 38.4 - 49.9 % 32.2(L) 35.3(L) 32.5(L)  Platelets 140 - 400 K/uL 96(L) 130(L) 78(L)  HGB 11.4 . CMP Latest Ref Rng & Units 07/25/2017 05/14/2017 03/14/2017  Glucose 70 - 140 mg/dL 147(H) 148(H) 144(H)  BUN 7 - 26 mg/dL 33(H) 28(H) 30.1(H)  Creatinine 0.70 - 1.30 mg/dL 7.77(HH) 7.74(HH) 8.1(HH)  Sodium 136 - 145 mmol/L 141 140 141  Potassium 3.5 - 5.1 mmol/L 4.1 3.5 4.1  Chloride 98 - 109 mmol/L 96(L) 94(L) -  CO2 22 - 29 mmol/L 32(H) 27 33(H)  Calcium 8.4 - 10.4 mg/dL 10.2 10.1 9.5  Total Protein 6.4 - 8.3 g/dL 7.5 7.8 7.7  Total Bilirubin 0.2 - 1.2 mg/dL 0.6 0.8 0.67  Alkaline Phos 40 - 150 U/L 60 69 69  AST 5 - 34 U/L 18 16  19  ALT 0 - 55 U/L '22 19 25       '$ Echocardiogram 05/02/2017   *[place], [place] [address]                            [phone]  ------------------------------------------------------------------- Transthoracic Echocardiography  Patient:    [place], Gelin MR #:       [id number] Study Date: 05/02/2017 Gender:     M Age:        52 Height:     170.2 cm Weight:     92.4 kg BSA:        2.12 m^2 Pt. Status: Room:   PERFORMING   [facility], Outpatient  ATTENDING    [place], [place]    [place], [place] Kishore  SONOGRAPHER  Jannett Celestine, RDCS  cc:  ------------------------------------------------------------------- LV EF: 60% -   65%  ------------------------------------------------------------------- History:   PMH:  Amyloidosis. Enlarged Heart. F/U for Amyloidosis. Risk factors:  Hypertension. Dyslipidemia.  ------------------------------------------------------------------- Study Conclusions  - Left ventricle: The cavity size was normal. Wall thickness was   increased in a pattern of moderate LVH. Systolic function was   normal. The estimated ejection fraction was in the range of 60%   to 65%. Wall motion was normal; there were no regional wall   motion abnormalities. Doppler parameters are consistent with   abnormal left ventricular relaxation (grade 1 diastolic   dysfunction). Strain pattern abnormal in the basal to mid wall   and preserved in the apex. - Aortic valve: Trileaflet; severely calcified leaflets. There was   mild stenosis. Mean gradient (S): 19 mm Hg. Valve area (VTI):   1.72 cm^2. - Mitral valve: There was no significant regurgitation. - Right ventricle: The cavity size was normal. Systolic function   was normal. - Pulmonary arteries: No complete TR doppler jet so unable to   estimate PA systolic pressure. - Inferior vena cava: The vessel was normal in size. The   respirophasic diameter changes were in the normal range (= 50%),   consistent  with normal central venous pressure. - Pericardium, extracardiac: A trivial pericardial effusion was   identified.  Impressions:  - Normal LV size with moderate LV hypertrophy. EF 60-65%. Bull&'s   eye strain pattern can be seen with cardiac amyloidosis. Normal   RV size and systolic function. Mild aortic stenosis. Trivial   pericardial effusion. This study is suggestive of cardiac   amyloidosis.  RADIOGRAPHIC STUDIES: I have personally reviewed the radiological images as listed and agreed with the findings in the report.   ASSESSMENT & PLAN:   Mr Jose Garrett is a very pleasant 67 yo caucasian male with   1) AL Amyloidosis noted on renal biopsy with nephrotic range proteinuria on urinalysis and some lambda free light chains on serum and urinary IFE . On diagnosis:  SPEP with small amount of M-spike 0.3  Echo showed no systolic dysfunction but grade 2 diastolic dysfunction. [facility] cardiac staging 1  as per troponin and BNP criteria. Bone marrow with no overt evidence of multiple myeloma. PET/CT scan showed no evidence of bony lesions or lymphadenopathy . Urine showed about 15 g of protein in 24 hours   Patient is status post CyBorD x 6 cycles He was then on maintenance Velcade and had progression of his AL Amyloidosis. He was then switched to Ixazomib and Dexamethasone and has completed 6 cycles without significant toxicities. Echo repeated on 12/11/2016 shows no changes in heart function compared to previous echo from earlier this year.  Myeloma panel/SFLC WNL with no evidence of progressive disease. ECHO repeated on 05/02/17 shows Normal LV size with moderate LV hypertrophy. EF 60-65%. Bull&'s eye strain pattern can be seen with cardiac amyloidosis. Normal RV size and systolic function. Mild aortic stenosis. Trivial pericardial effusion. This study is suggestive of cardiac   amyloidosis.  Maintenance Ixazomib q2weeks since 2017 and tolerating it without any significant new  toxicities. Worsened multifactorial neuropathy. Held Ixazomib since 03/14/17.     2) Thrombocytopenia mild likely from the Ixazomib. Patient is already on renally adjusted dose of the medication. PLT increased to 138k today from 78K - will continue to monitor  Plan  -Discussed pt labwork today, 07/25/17; blood counts and chemistries are stable.  -Pt remains in serologic remission -Reviewed 05/02/17 ECHO which revealed no change in the pumping function of his heart but could not rule out amyloidosis deposits given change in his strain patterns. -Has grade 2 neuropathy from previous Velcade/B12 def /HD which remains grade 2  -He has had back pain issues as well and cannot r/o a component of radiculopathy. He has been seen by neurologist and will continue to follow up. -given his persistent and bothersome neuropathy he has elected to hold his Ixazomib since 03/14/17. Continue to hold Ixazomib until neuropathy and back pain is better controlled. No overt prohibitive toxicity from Ixazomib. --myeloma panel from 5/162019 for M spike not detectable and serum free light chain ration WNL.  --continue on low-dose aspirin for VTE prophylaxis given his high risk of venous thromboembolism in the setting of nephrotic syndrome.  -continue Acyclovir for shingles prophylaxis  2) multifactorial anemia - likely multifactorial -from AL amyloidosis, anemia from CKD. Hemoglobin improved to 11.4   B12 WNL -Previously deficient. B12 07/12/2016 -- 1035 Plan  -Continue ESA's prn and iron replacement as per nephrology. -continue B12 replacement SL   3) CKD stage V-related to hypertension and AL Amyloidosis. Has been started on hemodialysis through a tunnel catheter since his last clinic visit. Had AV fistula placement on 12/27/2015  Plan  -Continue hemodialysis as per nephrologist.   4) Grade 1-2 peripheral neuropathy, worsening recently. Feet tingling and numbness ? Related to AL Amyloidosis vs Velcade vs B12 deficiency  vs HD (now)vs radiculopathy from DDD  stable.  Plan  -Increase Neurontin '300mg'$  capsules to 1 in the morning and 2 at night.  -He is also on amitriptyline. -I suggested the QUELL unit to reduce pain sensations from neuropathy. I also recommended acupuncture. -continue SL B12  --Last B12 levels WNL with replacement on 01/08/17 -He has seen neurologist and completed nerve conduction study -f/u with neurology for EMG/NCS -given orthopedics referral for his back pain and DDD -Starting pt on Oxycontin for neuropathy related pain in addition to Oxycodone prn  RTC with Dr [name] in 2 months with labs  All questions were answered. The patient knows to call the clinic with any problems, questions or concerns.  . The total time spent in  the appointment was 25 minutes and more than 50% was on counseling and direct patient cares.    Sullivan Lone MD MS Hematology/Oncology Physician [facility]  (Office):       [phone] (Work cell):  [phone] (Fax):           [phone]  This document serves as a record of services personally performed by Sullivan Lone, MD. It was created on his behalf by Baldwin Jamaica, a trained medical scribe. The creation of this record is based on the scribe's personal observations and the provider's statements to them.   .I have reviewed the above documentation for accuracy and completeness, and I agree with the above. [name] MD MS

## 2017-07-25 ENCOUNTER — Telehealth: Payer: Self-pay | Admitting: Hematology

## 2017-07-25 ENCOUNTER — Encounter: Payer: Self-pay | Admitting: Hematology

## 2017-07-25 ENCOUNTER — Inpatient Hospital Stay: Payer: Medicare Other | Attending: Hematology

## 2017-07-25 ENCOUNTER — Inpatient Hospital Stay (HOSPITAL_BASED_OUTPATIENT_CLINIC_OR_DEPARTMENT_OTHER): Payer: Medicare Other | Admitting: Hematology

## 2017-07-25 VITALS — BP 135/65 | HR 93 | Temp 98.8°F | Resp 18 | Ht 70.0 in | Wt 204.2 lb

## 2017-07-25 DIAGNOSIS — I350 Nonrheumatic aortic (valve) stenosis: Secondary | ICD-10-CM

## 2017-07-25 DIAGNOSIS — G2581 Restless legs syndrome: Secondary | ICD-10-CM | POA: Insufficient documentation

## 2017-07-25 DIAGNOSIS — E538 Deficiency of other specified B group vitamins: Secondary | ICD-10-CM

## 2017-07-25 DIAGNOSIS — K219 Gastro-esophageal reflux disease without esophagitis: Secondary | ICD-10-CM

## 2017-07-25 DIAGNOSIS — Z7982 Long term (current) use of aspirin: Secondary | ICD-10-CM | POA: Insufficient documentation

## 2017-07-25 DIAGNOSIS — I5032 Chronic diastolic (congestive) heart failure: Secondary | ICD-10-CM | POA: Diagnosis not present

## 2017-07-25 DIAGNOSIS — D696 Thrombocytopenia, unspecified: Secondary | ICD-10-CM | POA: Insufficient documentation

## 2017-07-25 DIAGNOSIS — E785 Hyperlipidemia, unspecified: Secondary | ICD-10-CM | POA: Diagnosis not present

## 2017-07-25 DIAGNOSIS — I132 Hypertensive heart and chronic kidney disease with heart failure and with stage 5 chronic kidney disease, or end stage renal disease: Secondary | ICD-10-CM | POA: Diagnosis not present

## 2017-07-25 DIAGNOSIS — I313 Pericardial effusion (noninflammatory): Secondary | ICD-10-CM | POA: Insufficient documentation

## 2017-07-25 DIAGNOSIS — Z808 Family history of malignant neoplasm of other organs or systems: Secondary | ICD-10-CM | POA: Insufficient documentation

## 2017-07-25 DIAGNOSIS — N183 Chronic kidney disease, stage 3 (moderate): Secondary | ICD-10-CM | POA: Insufficient documentation

## 2017-07-25 DIAGNOSIS — Z79899 Other long term (current) drug therapy: Secondary | ICD-10-CM | POA: Insufficient documentation

## 2017-07-25 DIAGNOSIS — G629 Polyneuropathy, unspecified: Secondary | ICD-10-CM | POA: Insufficient documentation

## 2017-07-25 DIAGNOSIS — Z87891 Personal history of nicotine dependence: Secondary | ICD-10-CM | POA: Diagnosis not present

## 2017-07-25 DIAGNOSIS — D649 Anemia, unspecified: Secondary | ICD-10-CM

## 2017-07-25 DIAGNOSIS — E8581 Light chain (AL) amyloidosis: Secondary | ICD-10-CM | POA: Insufficient documentation

## 2017-07-25 LAB — CMP (CANCER CENTER ONLY)
ALBUMIN: 4 g/dL (ref 3.5–5.0)
ALT: 22 U/L (ref 0–55)
ANION GAP: 13 — AB (ref 3–11)
AST: 18 U/L (ref 5–34)
Alkaline Phosphatase: 60 U/L (ref 40–150)
BUN: 33 mg/dL — ABNORMAL HIGH (ref 7–26)
CHLORIDE: 96 mmol/L — AB (ref 98–109)
CO2: 32 mmol/L — AB (ref 22–29)
Calcium: 10.2 mg/dL (ref 8.4–10.4)
Creatinine: 7.77 mg/dL (ref 0.70–1.30)
GFR, EST AFRICAN AMERICAN: 7 mL/min — AB (ref >60)
GFR, Estimated: 6 mL/min — ABNORMAL LOW (ref >60)
Glucose, Bld: 147 mg/dL — ABNORMAL HIGH (ref 70–140)
POTASSIUM: 4.1 mmol/L (ref 3.5–5.1)
SODIUM: 141 mmol/L (ref 136–145)
Total Bilirubin: 0.6 mg/dL (ref 0.2–1.2)
Total Protein: 7.5 g/dL (ref 6.4–8.3)

## 2017-07-25 LAB — RETICULOCYTES
RBC.: 3.05 MIL/uL — ABNORMAL LOW (ref 4.20–5.82)
RETIC CT PCT: 1.7 % (ref 0.8–1.8)
Retic Count, Absolute: 51.9 10*3/uL (ref 34.8–93.9)

## 2017-07-25 LAB — CBC WITH DIFFERENTIAL (CANCER CENTER ONLY)
Basophils Absolute: 0 10*3/uL (ref 0.0–0.1)
Basophils Relative: 0 %
EOS ABS: 0.1 10*3/uL (ref 0.0–0.5)
Eosinophils Relative: 2 %
HEMATOCRIT: 32.2 % — AB (ref 38.4–49.9)
Hemoglobin: 10.8 g/dL — ABNORMAL LOW (ref 13.0–17.1)
LYMPHS ABS: 1.4 10*3/uL (ref 0.9–3.3)
LYMPHS PCT: 23 %
MCH: 35.4 pg — AB (ref 27.2–33.4)
MCHC: 33.5 g/dL (ref 32.0–36.0)
MCV: 105.6 fL — AB (ref 79.3–98.0)
MONOS PCT: 8 %
Monocytes Absolute: 0.5 10*3/uL (ref 0.1–0.9)
NEUTROS PCT: 67 %
Neutro Abs: 4 10*3/uL (ref 1.5–6.5)
Platelet Count: 96 10*3/uL — ABNORMAL LOW (ref 140–400)
RBC: 3.05 MIL/uL — AB (ref 4.20–5.82)
RDW: 14.6 % (ref 11.0–14.6)
WBC Count: 6 10*3/uL (ref 4.0–10.3)

## 2017-07-25 MED ORDER — OXYCODONE HCL 5 MG PO TABS: 5.0000 mg | ORAL_TABLET | Freq: Four times a day (QID) | ORAL | 0 refills | Status: DC | PRN

## 2017-07-25 MED ORDER — OXYCODONE HCL ER 10 MG PO T12A: 10.0000 mg | EXTENDED_RELEASE_TABLET | Freq: Every day | ORAL | 0 refills | Status: DC

## 2017-07-25 MED ORDER — AMITRIPTYLINE HCL 50 MG PO TABS: 50.0000 mg | ORAL_TABLET | Freq: Every day | ORAL | Status: DC

## 2017-07-25 NOTE — Telephone Encounter (Signed)
Spoke with patient re lab/fu 7/9. Patient declined print out due to he uses mychart.

## 2017-07-26 DIAGNOSIS — N186 End stage renal disease: Secondary | ICD-10-CM | POA: Diagnosis not present

## 2017-07-26 DIAGNOSIS — N2581 Secondary hyperparathyroidism of renal origin: Secondary | ICD-10-CM | POA: Diagnosis not present

## 2017-07-26 DIAGNOSIS — D509 Iron deficiency anemia, unspecified: Secondary | ICD-10-CM | POA: Diagnosis not present

## 2017-07-26 DIAGNOSIS — D631 Anemia in chronic kidney disease: Secondary | ICD-10-CM | POA: Diagnosis not present

## 2017-07-26 LAB — KAPPA/LAMBDA LIGHT CHAINS
KAPPA FREE LGHT CHN: 145.8 mg/L — AB (ref 3.3–19.4)
KAPPA, LAMDA LIGHT CHAIN RATIO: 1.16 (ref 0.26–1.65)
Lambda free light chains: 125.2 mg/L — ABNORMAL HIGH (ref 5.7–26.3)

## 2017-07-29 DIAGNOSIS — N186 End stage renal disease: Secondary | ICD-10-CM | POA: Diagnosis not present

## 2017-07-29 DIAGNOSIS — N2581 Secondary hyperparathyroidism of renal origin: Secondary | ICD-10-CM | POA: Diagnosis not present

## 2017-07-29 DIAGNOSIS — D509 Iron deficiency anemia, unspecified: Secondary | ICD-10-CM | POA: Diagnosis not present

## 2017-07-29 DIAGNOSIS — D631 Anemia in chronic kidney disease: Secondary | ICD-10-CM | POA: Diagnosis not present

## 2017-07-30 LAB — MULTIPLE MYELOMA PANEL, SERUM
ALBUMIN SERPL ELPH-MCNC: 3.6 g/dL (ref 2.9–4.4)
ALPHA 1: 0.3 g/dL (ref 0.0–0.4)
Albumin/Glob SerPl: 1.2 (ref 0.7–1.7)
Alpha2 Glob SerPl Elph-Mcnc: 0.9 g/dL (ref 0.4–1.0)
B-Globulin SerPl Elph-Mcnc: 0.9 g/dL (ref 0.7–1.3)
GAMMA GLOB SERPL ELPH-MCNC: 1 g/dL (ref 0.4–1.8)
Globulin, Total: 3.1 g/dL (ref 2.2–3.9)
IGA: 119 mg/dL (ref 61–437)
IGM (IMMUNOGLOBULIN M), SRM: 149 mg/dL (ref 20–172)
IgG (Immunoglobin G), Serum: 953 mg/dL (ref 700–1600)
TOTAL PROTEIN ELP: 6.7 g/dL (ref 6.0–8.5)

## 2017-07-30 NOTE — Progress Notes (Signed)
Prior auth for Oxycontin 10 mg has been submitted. Status is pending.

## 2017-07-31 DIAGNOSIS — D509 Iron deficiency anemia, unspecified: Secondary | ICD-10-CM | POA: Diagnosis not present

## 2017-07-31 DIAGNOSIS — D631 Anemia in chronic kidney disease: Secondary | ICD-10-CM | POA: Diagnosis not present

## 2017-07-31 DIAGNOSIS — N186 End stage renal disease: Secondary | ICD-10-CM | POA: Diagnosis not present

## 2017-07-31 DIAGNOSIS — N2581 Secondary hyperparathyroidism of renal origin: Secondary | ICD-10-CM | POA: Diagnosis not present

## 2017-08-02 DIAGNOSIS — N2581 Secondary hyperparathyroidism of renal origin: Secondary | ICD-10-CM | POA: Diagnosis not present

## 2017-08-02 DIAGNOSIS — D509 Iron deficiency anemia, unspecified: Secondary | ICD-10-CM | POA: Diagnosis not present

## 2017-08-02 DIAGNOSIS — D631 Anemia in chronic kidney disease: Secondary | ICD-10-CM | POA: Diagnosis not present

## 2017-08-02 DIAGNOSIS — N186 End stage renal disease: Secondary | ICD-10-CM | POA: Diagnosis not present

## 2017-08-05 DIAGNOSIS — N186 End stage renal disease: Secondary | ICD-10-CM | POA: Diagnosis not present

## 2017-08-05 DIAGNOSIS — D509 Iron deficiency anemia, unspecified: Secondary | ICD-10-CM | POA: Diagnosis not present

## 2017-08-05 DIAGNOSIS — D631 Anemia in chronic kidney disease: Secondary | ICD-10-CM | POA: Diagnosis not present

## 2017-08-05 DIAGNOSIS — N2581 Secondary hyperparathyroidism of renal origin: Secondary | ICD-10-CM | POA: Diagnosis not present

## 2017-08-06 ENCOUNTER — Telehealth: Payer: Self-pay | Admitting: *Deleted

## 2017-08-06 NOTE — Progress Notes (Signed)
Prior auth for Oxycontin was denied. Submitted an urgent appeal. Appeal is pending.

## 2017-08-06 NOTE — Telephone Encounter (Signed)
Call received from [name] in reference to "Oxycodone order.  [place] advised me to call the office.  Today I'm starting to feel discomfort.  I have met with financial counselors so everything on me should be up to date.  I have medicaid but pharmacist said the stronger oxycodone will cost $100.00."    Called drug replacement specialist.  Message left to call patient with status update at [phone] with help and answer questions about pending prior authorization request with  with this matter.  Denies needs at this time in reference to pain.  "I'm okay right now, am sure this will be approved soon.

## 2017-08-07 DIAGNOSIS — N2581 Secondary hyperparathyroidism of renal origin: Secondary | ICD-10-CM | POA: Diagnosis not present

## 2017-08-07 DIAGNOSIS — N186 End stage renal disease: Secondary | ICD-10-CM | POA: Diagnosis not present

## 2017-08-07 DIAGNOSIS — D509 Iron deficiency anemia, unspecified: Secondary | ICD-10-CM | POA: Diagnosis not present

## 2017-08-07 DIAGNOSIS — D631 Anemia in chronic kidney disease: Secondary | ICD-10-CM | POA: Diagnosis not present

## 2017-08-09 DIAGNOSIS — D509 Iron deficiency anemia, unspecified: Secondary | ICD-10-CM | POA: Diagnosis not present

## 2017-08-09 DIAGNOSIS — D631 Anemia in chronic kidney disease: Secondary | ICD-10-CM | POA: Diagnosis not present

## 2017-08-09 DIAGNOSIS — N186 End stage renal disease: Secondary | ICD-10-CM | POA: Diagnosis not present

## 2017-08-09 DIAGNOSIS — N2581 Secondary hyperparathyroidism of renal origin: Secondary | ICD-10-CM | POA: Diagnosis not present

## 2017-08-10 DIAGNOSIS — Z992 Dependence on renal dialysis: Secondary | ICD-10-CM | POA: Diagnosis not present

## 2017-08-10 DIAGNOSIS — I129 Hypertensive chronic kidney disease with stage 1 through stage 4 chronic kidney disease, or unspecified chronic kidney disease: Secondary | ICD-10-CM | POA: Diagnosis not present

## 2017-08-10 DIAGNOSIS — N186 End stage renal disease: Secondary | ICD-10-CM | POA: Diagnosis not present

## 2017-08-12 DIAGNOSIS — D631 Anemia in chronic kidney disease: Secondary | ICD-10-CM | POA: Diagnosis not present

## 2017-08-12 DIAGNOSIS — N2581 Secondary hyperparathyroidism of renal origin: Secondary | ICD-10-CM | POA: Diagnosis not present

## 2017-08-12 DIAGNOSIS — N186 End stage renal disease: Secondary | ICD-10-CM | POA: Diagnosis not present

## 2017-08-12 DIAGNOSIS — D509 Iron deficiency anemia, unspecified: Secondary | ICD-10-CM | POA: Diagnosis not present

## 2017-08-12 NOTE — Progress Notes (Signed)
Prior auth for Oxycontin 10 mg has been approved.

## 2017-08-14 DIAGNOSIS — N186 End stage renal disease: Secondary | ICD-10-CM | POA: Diagnosis not present

## 2017-08-14 DIAGNOSIS — D509 Iron deficiency anemia, unspecified: Secondary | ICD-10-CM | POA: Diagnosis not present

## 2017-08-14 DIAGNOSIS — D631 Anemia in chronic kidney disease: Secondary | ICD-10-CM | POA: Diagnosis not present

## 2017-08-14 DIAGNOSIS — N2581 Secondary hyperparathyroidism of renal origin: Secondary | ICD-10-CM | POA: Diagnosis not present

## 2017-08-16 DIAGNOSIS — N2581 Secondary hyperparathyroidism of renal origin: Secondary | ICD-10-CM | POA: Diagnosis not present

## 2017-08-16 DIAGNOSIS — N186 End stage renal disease: Secondary | ICD-10-CM | POA: Diagnosis not present

## 2017-08-16 DIAGNOSIS — D509 Iron deficiency anemia, unspecified: Secondary | ICD-10-CM | POA: Diagnosis not present

## 2017-08-16 DIAGNOSIS — D631 Anemia in chronic kidney disease: Secondary | ICD-10-CM | POA: Diagnosis not present

## 2017-08-19 DIAGNOSIS — D631 Anemia in chronic kidney disease: Secondary | ICD-10-CM | POA: Diagnosis not present

## 2017-08-19 DIAGNOSIS — N2581 Secondary hyperparathyroidism of renal origin: Secondary | ICD-10-CM | POA: Diagnosis not present

## 2017-08-19 DIAGNOSIS — N186 End stage renal disease: Secondary | ICD-10-CM | POA: Diagnosis not present

## 2017-08-19 DIAGNOSIS — D509 Iron deficiency anemia, unspecified: Secondary | ICD-10-CM | POA: Diagnosis not present

## 2017-08-21 DIAGNOSIS — N2581 Secondary hyperparathyroidism of renal origin: Secondary | ICD-10-CM | POA: Diagnosis not present

## 2017-08-21 DIAGNOSIS — D509 Iron deficiency anemia, unspecified: Secondary | ICD-10-CM | POA: Diagnosis not present

## 2017-08-21 DIAGNOSIS — N186 End stage renal disease: Secondary | ICD-10-CM | POA: Diagnosis not present

## 2017-08-21 DIAGNOSIS — D631 Anemia in chronic kidney disease: Secondary | ICD-10-CM | POA: Diagnosis not present

## 2017-08-23 DIAGNOSIS — N186 End stage renal disease: Secondary | ICD-10-CM | POA: Diagnosis not present

## 2017-08-23 DIAGNOSIS — D631 Anemia in chronic kidney disease: Secondary | ICD-10-CM | POA: Diagnosis not present

## 2017-08-23 DIAGNOSIS — D509 Iron deficiency anemia, unspecified: Secondary | ICD-10-CM | POA: Diagnosis not present

## 2017-08-23 DIAGNOSIS — N2581 Secondary hyperparathyroidism of renal origin: Secondary | ICD-10-CM | POA: Diagnosis not present

## 2017-08-26 DIAGNOSIS — N186 End stage renal disease: Secondary | ICD-10-CM | POA: Diagnosis not present

## 2017-08-26 DIAGNOSIS — N2581 Secondary hyperparathyroidism of renal origin: Secondary | ICD-10-CM | POA: Diagnosis not present

## 2017-08-26 DIAGNOSIS — D509 Iron deficiency anemia, unspecified: Secondary | ICD-10-CM | POA: Diagnosis not present

## 2017-08-26 DIAGNOSIS — D631 Anemia in chronic kidney disease: Secondary | ICD-10-CM | POA: Diagnosis not present

## 2017-08-28 DIAGNOSIS — N2581 Secondary hyperparathyroidism of renal origin: Secondary | ICD-10-CM | POA: Diagnosis not present

## 2017-08-28 DIAGNOSIS — N186 End stage renal disease: Secondary | ICD-10-CM | POA: Diagnosis not present

## 2017-08-28 DIAGNOSIS — D509 Iron deficiency anemia, unspecified: Secondary | ICD-10-CM | POA: Diagnosis not present

## 2017-08-28 DIAGNOSIS — D631 Anemia in chronic kidney disease: Secondary | ICD-10-CM | POA: Diagnosis not present

## 2017-08-30 ENCOUNTER — Ambulatory Visit (INDEPENDENT_AMBULATORY_CARE_PROVIDER_SITE_OTHER): Payer: Medicare Other | Admitting: Podiatry

## 2017-08-30 ENCOUNTER — Encounter: Payer: Self-pay | Admitting: Podiatry

## 2017-08-30 VITALS — BP 169/76 | HR 88

## 2017-08-30 DIAGNOSIS — D631 Anemia in chronic kidney disease: Secondary | ICD-10-CM | POA: Diagnosis not present

## 2017-08-30 DIAGNOSIS — M79675 Pain in left toe(s): Secondary | ICD-10-CM

## 2017-08-30 DIAGNOSIS — M79674 Pain in right toe(s): Secondary | ICD-10-CM | POA: Diagnosis not present

## 2017-08-30 DIAGNOSIS — B351 Tinea unguium: Secondary | ICD-10-CM | POA: Diagnosis not present

## 2017-08-30 DIAGNOSIS — D509 Iron deficiency anemia, unspecified: Secondary | ICD-10-CM | POA: Diagnosis not present

## 2017-08-30 DIAGNOSIS — N2581 Secondary hyperparathyroidism of renal origin: Secondary | ICD-10-CM | POA: Diagnosis not present

## 2017-08-30 DIAGNOSIS — G629 Polyneuropathy, unspecified: Secondary | ICD-10-CM

## 2017-08-30 DIAGNOSIS — N186 End stage renal disease: Secondary | ICD-10-CM | POA: Diagnosis not present

## 2017-08-30 NOTE — Progress Notes (Signed)
Complaint:  Visit Type: Patient presents  to my office for  preventative foot care services. Complaint: Patient states" my nails have grown long and thick and become painful to walk and wear shoes" Patient has been diagnosed with  with neuropathy and is a dialysis patient.. The patient presents for preventative foot care services. No changes to ROS  Podiatric Exam: Vascular: dorsalis pedis and posterior tibial pulses are palpable bilateral. Capillary return is immediate. Temperature gradient is WNL. Skin turgor WNL  Sensorium: Diminished  Semmes Weinstein monofilament test. Normal tactile sensation bilaterally. Nail Exam: Pt has thick disfigured discolored nails with subungual debris noted bilateral entire nail hallux through fifth toenails Ulcer Exam: There is no evidence of ulcer or pre-ulcerative changes or infection. Orthopedic Exam: Muscle tone and strength are WNL. No limitations in general ROM. No crepitus or effusions noted. Foot type and digits show no abnormalities. Bony prominences are unremarkable. Skin: No Porokeratosis. No infection or ulcers  Diagnosis:  Onychomycosis, , Pain in right toe, pain in left toes,  Neuropathy  Treatment & Plan Procedures and Treatment: Consent by patient was obtained for treatment procedures.   Debridement of mycotic and hypertrophic toenails, 1 through 5 bilateral and clearing of subungual debris. No ulceration, no infection noted.  Return Visit-Office Procedure: Patient instructed to return to the office for a follow up visit 3 months for continued evaluation and treatment.    Gardiner Barefoot DPM

## 2017-09-02 DIAGNOSIS — D631 Anemia in chronic kidney disease: Secondary | ICD-10-CM | POA: Diagnosis not present

## 2017-09-02 DIAGNOSIS — N2581 Secondary hyperparathyroidism of renal origin: Secondary | ICD-10-CM | POA: Diagnosis not present

## 2017-09-02 DIAGNOSIS — D509 Iron deficiency anemia, unspecified: Secondary | ICD-10-CM | POA: Diagnosis not present

## 2017-09-02 DIAGNOSIS — N186 End stage renal disease: Secondary | ICD-10-CM | POA: Diagnosis not present

## 2017-09-04 DIAGNOSIS — N186 End stage renal disease: Secondary | ICD-10-CM | POA: Diagnosis not present

## 2017-09-04 DIAGNOSIS — D509 Iron deficiency anemia, unspecified: Secondary | ICD-10-CM | POA: Diagnosis not present

## 2017-09-04 DIAGNOSIS — D631 Anemia in chronic kidney disease: Secondary | ICD-10-CM | POA: Diagnosis not present

## 2017-09-04 DIAGNOSIS — N2581 Secondary hyperparathyroidism of renal origin: Secondary | ICD-10-CM | POA: Diagnosis not present

## 2017-09-06 DIAGNOSIS — D509 Iron deficiency anemia, unspecified: Secondary | ICD-10-CM | POA: Diagnosis not present

## 2017-09-06 DIAGNOSIS — D631 Anemia in chronic kidney disease: Secondary | ICD-10-CM | POA: Diagnosis not present

## 2017-09-06 DIAGNOSIS — N186 End stage renal disease: Secondary | ICD-10-CM | POA: Diagnosis not present

## 2017-09-06 DIAGNOSIS — N2581 Secondary hyperparathyroidism of renal origin: Secondary | ICD-10-CM | POA: Diagnosis not present

## 2017-09-09 DIAGNOSIS — Z992 Dependence on renal dialysis: Secondary | ICD-10-CM | POA: Diagnosis not present

## 2017-09-09 DIAGNOSIS — N2581 Secondary hyperparathyroidism of renal origin: Secondary | ICD-10-CM | POA: Diagnosis not present

## 2017-09-09 DIAGNOSIS — N186 End stage renal disease: Secondary | ICD-10-CM | POA: Diagnosis not present

## 2017-09-09 DIAGNOSIS — D509 Iron deficiency anemia, unspecified: Secondary | ICD-10-CM | POA: Diagnosis not present

## 2017-09-09 DIAGNOSIS — I129 Hypertensive chronic kidney disease with stage 1 through stage 4 chronic kidney disease, or unspecified chronic kidney disease: Secondary | ICD-10-CM | POA: Diagnosis not present

## 2017-09-09 DIAGNOSIS — D631 Anemia in chronic kidney disease: Secondary | ICD-10-CM | POA: Diagnosis not present

## 2017-09-11 DIAGNOSIS — D509 Iron deficiency anemia, unspecified: Secondary | ICD-10-CM | POA: Diagnosis not present

## 2017-09-11 DIAGNOSIS — N2581 Secondary hyperparathyroidism of renal origin: Secondary | ICD-10-CM | POA: Diagnosis not present

## 2017-09-11 DIAGNOSIS — N186 End stage renal disease: Secondary | ICD-10-CM | POA: Diagnosis not present

## 2017-09-11 DIAGNOSIS — D631 Anemia in chronic kidney disease: Secondary | ICD-10-CM | POA: Diagnosis not present

## 2017-09-13 DIAGNOSIS — N186 End stage renal disease: Secondary | ICD-10-CM | POA: Diagnosis not present

## 2017-09-13 DIAGNOSIS — D509 Iron deficiency anemia, unspecified: Secondary | ICD-10-CM | POA: Diagnosis not present

## 2017-09-13 DIAGNOSIS — N2581 Secondary hyperparathyroidism of renal origin: Secondary | ICD-10-CM | POA: Diagnosis not present

## 2017-09-13 DIAGNOSIS — D631 Anemia in chronic kidney disease: Secondary | ICD-10-CM | POA: Diagnosis not present

## 2017-09-13 NOTE — Progress Notes (Signed)
.    Hematology/Oncology clinic note  Date of service:  09/17/17  Patient Care Team: [name], MD as PCP - General (Family Medicine) [name], MD as PCP - Hematology/Oncology (Hematology and Oncology) [name], MD as Consulting Physician (Nephrology) Dr Deterding as Nephrologist   CHIEF COMPLAINTS: followup for continued management of AL amyloidosis.  HISTORY OF PRESENTING ILLNESS: Please see my initial consultation for details on presentation.  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 AL amyloidosis noted on kidney biopsy diagnosed 09/28/2014.    previous treatment  CyBorD x 6 cycles Evaluated for and declined Auto HSCT at [facility]. - maintenance Velcade  Ixazomib + Dexamethasone s/p 6 cycles  Ixazomib maintenance q2weeks  Current treatment Active surveillance -off Maintenance Ixazomib due to concerns for neuropathy.  INTERVAL HISTORY:    [name] is here for his scheduled 2 month follow-up for AL amyloidosis. The patient's last visit with [place] was on 07/25/17. The pt reports that he is doing well overall. The pt has been off of Ninlaro for over one year.  The pt reports that his neuropathy has been stable. He is taking Gabapentin '300mg'$  in the morning and '600mg'$  at night. He continues on Amitriptyline as well. He notes that his back continues to occasionally gets tight he gets weakness that runs down his right leg, he was evaluated by [facility] who recommended options of steroid shots or accupuncture, or physical therapy - he has not pursued any treatment yet but is considering steroid shots. He last had an NCV on 03/21/17 that revealed bilateral radiculopathy, worse on the right. He is taking Oxycodone as needed to treat his back pain.   He notes that he has not had any falls, and has been using a cane to succesfully ambulate.    He is continuing to receive dialysis and  sees Dr Jeneen Rinks Deterding.   He notes that he will be having cataracts surgery before his next visit with me.   Lab results today (09/16/17) of CBC w/diff, CMP is as follows: all values are WNL except for RBC at 2.56, HGB at 9.0, HCT at 26.5, MCV at 103.5, MCH at 35.2, RDW at 15.6, PLT at 103k, Glucose at 127, BUN at 29, Creatinine at 9.16, AST at 14, GFR at 5. SPEP 09/17/17 is no M spike Kappa/Lambda 09/17/17 is SFLC ratio WNL Vitamin B12 on 09/17/17 is 596  On review of systems, pt reports occasional back pain, right leg pain, peripheral neuropathy, a cup of urine production each day, some fatigue, and denies new bone pains, falls, abdominal discomfort, and any other symptoms.   MEDICAL HISTORY:  Past Medical History:  Diagnosis Date  . Amyloidosis ([place])   . Anemia Dx 2016  . Chronic diastolic CHF (congestive heart failure), NYHA class 1 ([place])    /hospital problem list 09/08/2014  . Chronic renal disease, stage III ([place])    /hospital problem list 09/08/2014  . Diarrhea   . Dyspnea    with exertion  . Enlarged heart 2015  . Family history of adverse reaction to anesthesia    "my daughter can't take certain anesthesia agents" (09/08/2014)  . GERD (gastroesophageal reflux disease)   . History of blood transfusion 09/08/2014   "got hematoma after renal biopsy & HgB dropped"  . Hyperlipidemia Dx 2012  . Hypertension Dx 2012  . Renal disorder    "there is a spot on my kidney" (09/08/2014)  . Restless legs  SURGICAL HISTORY: Past Surgical History:  Procedure Laterality Date  . AV FISTULA PLACEMENT Right 12/27/2015   Procedure: Right Arm ARTERIOVENOUS (AV) FISTULA CREATION;  Surgeon: Angelia Mould, MD;  Location: [place];  Service: Vascular;  Laterality: Right;  . COLONOSCOPY    . IR GENERIC HISTORICAL  01/27/2016   IR FLUORO GUIDE CV LINE RIGHT 01/27/2016 Arne Cleveland, MD [facility]INTER[facility]  . IR GENERIC HISTORICAL  01/27/2016   IR US GUIDE VASC ACCESS RIGHT 01/27/2016 Arne Cleveland,  MD [facility]INTER[facility]  . LAPAROSCOPIC CHOLECYSTECTOMY  03/2011  . RENAL BIOPSY, PERCUTANEOUS Right 09/08/2014  . TONSILLECTOMY  ~ 1960    SOCIAL HISTORY: Social History   Socioeconomic History  . Marital status: Divorced    Spouse name: Not on file  . Number of children: Not on file  . Years of education: Not on file  . Highest education level: Not on file  Occupational History  . Not on file  Social Needs  . Financial resource strain: Not on file  . Food insecurity:    Worry: Not on file    Inability: Not on file  . Transportation needs:    Medical: Not on file    Non-medical: Not on file  Tobacco Use  . Smoking status: Former Smoker    Packs/day: 2.00    Years: 40.00    Pack years: 80.00    Types: Cigarettes    Last attempt to quit: 03/19/2011    Years since quitting: 6.5  . Smokeless tobacco: Never Used  Substance and Sexual Activity  . Alcohol use: No    Alcohol/week: 0.0 oz  . Drug use: No    Comment: 09/08/2014 "I have used marijuana till the 1990's". Notes that he quit 15 yrs ago  . Sexual activity: Not Currently  Lifestyle  . Physical activity:    Days per week: Not on file    Minutes per session: Not on file  . Stress: Not on file  Relationships  . Social connections:    Talks on phone: Not on file    Gets together: Not on file    Attends religious service: Not on file    Active member of club or organization: Not on file    Attends meetings of clubs or organizations: Not on file    Relationship status: Not on file  . Intimate partner violence:    Fear of current or ex partner: Not on file    Emotionally abused: Not on file    Physically abused: Not on file    Forced sexual activity: Not on file  Other Topics Concern  . 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 Lives with his daughter and her finance.  FAMILY HISTORY: Family History  Problem Relation Age of Onset  . Hypertension Mother   . Diabetes Mother   . Heart disease Mother   . Skin cancer Mother   . Hypertension Father   . Diabetes Father   . Diabetes Sister     ALLERGIES:  is allergic to other and no known allergies.  MEDICATIONS:  Current Outpatient Medications  Medication Sig Dispense Refill  . acetaminophen (TYLENOL) 325 MG tablet Take 325-650 mg by mouth every 6 (six) hours as needed (back pain).    [name]  acyclovir (ZOVIRAX) 400 MG tablet TAKE 1 TABLET BY MOUTH DAILY. (DOSE ADJUSTED FOR RENAL FUNCTION) 60 tablet 2  . amitriptyline (ELAVIL) 50 MG tablet Take 1 tablet (50 mg total) by mouth at bedtime.    [name] aspirin EC 81 MG tablet Take 1 tablet (81 mg total) by mouth daily. 30 tablet 11  . B Complex-C-Folic Acid (DIALYVITE 098) 0.8 MG TABS Take 0.8 mg by mouth daily.    . cetirizine (ZYRTEC) 10 MG tablet Take 10 mg by mouth daily.    . Cholecalciferol (VITAMIN D3) 3000 units TABS Take 3,000 Units by mouth daily.    . cyanocobalamin 2000 MCG tablet Take 2,500 mcg by mouth daily.    [name] Epoetin Alfa (PROCRIT IJ) Inject as directed every 14 (fourteen) days. Done at [facility] Last injection 01/19/16    . gabapentin (NEURONTIN) 300 MG capsule '300mg'$  po qAM and '600mg'$  po qHS 90 capsule 1  . LORazepam (ATIVAN) 1 MG tablet Take 1 tablet (1 mg total) by mouth daily as needed for anxiety (nausea). 30 tablet 0  . multivitamin (RENA-VIT) TABS tablet Take 1 tablet by mouth daily.    [name] oxyCODONE (OXY IR/ROXICODONE) 5 MG immediate release tablet Take 1-2 tablets (5-10 mg total) by mouth every 6 (six) hours as needed for severe pain. 60 tablet 0  . oxyCODONE (OXYCONTIN) 10 mg 12 hr tablet Take 1 tablet (10 mg total) by mouth at bedtime. 30 tablet 0  . VELPHORO 500 MG chewable tablet Chew by mouth daily.  10   No current facility-administered medications for this visit.      REVIEW OF SYSTEMS:    A 10+ POINT REVIEW OF SYSTEMS WAS OBTAINED including neurology, dermatology, psychiatry, cardiac, respiratory, lymph, extremities, GI, GU, Musculoskeletal, constitutional, breasts, reproductive, HEENT.  All pertinent positives are noted in the HPI.  All others are negative.   PHYSICAL EXAMINATION: ECOG PERFORMANCE STATUS: 2 - Symptomatic, <50% confined to bed  Vitals:   09/17/17 1011  BP: (!) 179/70  Pulse: 79  Resp: 18  Temp: 98.1 F (36.7 C)  SpO2: 99%   Filed Weights   09/17/17 1011  Weight: 204 lb (92.5 kg)    GENERAL:alert, in no acute distress and comfortable SKIN: no acute rashes, no significant lesions EYES: conjunctiva are pink and non-injected, sclera anicteric OROPHARYNX: MMM, no exudates, no oropharyngeal erythema or ulceration NECK: supple, no JVD LYMPH:  no palpable lymphadenopathy in the cervical, axillary or inguinal regions LUNGS: clear to auscultation b/l with normal respiratory effort HEART: regular rate & rhythm, 2/6 SM over the aortic area ABDOMEN:  normoactive bowel sounds , non tender, not distended. No palpable hepatosplenomegaly.  Extremity: trace pedal edema PSYCH: alert & oriented x 3 with fluent speech NEURO: no focal motor/sensory deficits   LABORATORY DATA:   . CBC Latest Ref Rng & Units 09/17/2017 07/25/2017 05/14/2017  WBC 4.0 - 10.3 K/uL 4.5 6.0 7.5  Hemoglobin 13.0 - 17.1 g/dL 9.0(L) 10.8(L) 11.4(L)  Hematocrit 38.4 - 49.9 % 26.5(L) 32.2(L) 35.3(L)  Platelets 140 - 400 K/uL 103(L) 96(L) 130(L)  HGB 11.4 . CMP Latest Ref Rng & Units 09/17/2017 07/25/2017 05/14/2017  Glucose 70 - 99 mg/dL 127(H) 147(H) 148(H)  BUN 8 - 23 mg/dL 29(H) 33(H) 28(H)  Creatinine 0.61 - 1.24 mg/dL 9.16(HH) 7.77(HH) 7.74(HH)  Sodium 135 - 145 mmol/L 142 141 140  Potassium 3.5 - 5.1 mmol/L 3.7 4.1 3.5  Chloride 98 - 111 mmol/L 98 96(L) 94(L)  CO2 22 - 32 mmol/L  31 32(H) 27  Calcium 8.9 - 10.3 mg/dL 10.0 10.2 10.1  Total Protein 6.5 - 8.1  g/dL 7.1 7.5 7.8  Total Bilirubin 0.3 - 1.2 mg/dL 0.7 0.6 0.8  Alkaline Phos 38 - 126 U/L 49 60 69  AST 15 - 41 U/L 14(L) 18 16  ALT 0 - 44 U/L '17 22 19        '$ Echocardiogram 05/02/2017   *[facility]*                         [address], [place] [address]                            [phone]  ------------------------------------------------------------------- Transthoracic Echocardiography  Patient:    Tyrez, Berrios MR #:       [id number] Study Date: 05/02/2017 Gender:     M Age:        67 Height:     170.2 cm Weight:     92.4 kg BSA:        2.12 m^2 Pt. Status: Room:   PERFORMING   [facility], Outpatient  ATTENDING    [place], [place]    [place], [place] Kishore  SONOGRAPHER  Jannett Celestine, RDCS  cc:  ------------------------------------------------------------------- LV EF: 60% -   65%  ------------------------------------------------------------------- History:   PMH:  Amyloidosis. Enlarged Heart. F/U for Amyloidosis. Risk factors:  Hypertension. Dyslipidemia.  ------------------------------------------------------------------- Study Conclusions  - Left ventricle: The cavity size was normal. Wall thickness was   increased in a pattern of moderate LVH. Systolic function was   normal. The estimated ejection fraction was in the range of 60%   to 65%. Wall motion was normal; there were no regional wall   motion abnormalities. Doppler parameters are consistent with   abnormal left ventricular relaxation (grade 1 diastolic   dysfunction). Strain pattern abnormal in the basal to mid wall   and preserved in the apex. - Aortic valve: Trileaflet; severely calcified leaflets. There was   mild stenosis. Mean gradient (S): 19 mm Hg. Valve area (VTI):   1.72 cm^2. - Mitral valve: There was no significant regurgitation. - Right  ventricle: The cavity size was normal. Systolic function   was normal. - Pulmonary arteries: No complete TR doppler jet so unable to   estimate PA systolic pressure. - Inferior vena cava: The vessel was normal in size. The   respirophasic diameter changes were in the normal range (= 50%),   consistent with normal central venous pressure. - Pericardium, extracardiac: A trivial pericardial effusion was   identified.  Impressions:  - Normal LV size with moderate LV hypertrophy. EF 60-65%. Bull&'s   eye strain pattern can be seen with cardiac amyloidosis. Normal   RV size and systolic function. Mild aortic stenosis. Trivial   pericardial effusion. This study is suggestive of cardiac   amyloidosis.  RADIOGRAPHIC STUDIES: I have personally reviewed the radiological images as listed and agreed with the findings in the report.   ASSESSMENT & PLAN:   Mr Jose Garrett is a very pleasant 67 y.o. caucasian male with  1) AL Amyloidosis noted on renal biopsy with nephrotic range proteinuria on urinalysis and some lambda free light chains on serum and urinary IFE . On diagnosis:  SPEP with small amount of M-spike 0.3  Echo showed no systolic dysfunction but grade 2 diastolic dysfunction. [facility] cardiac staging 1 as per troponin and BNP criteria. Bone marrow with no overt evidence of multiple myeloma. PET/CT scan showed no evidence of bony lesions or lymphadenopathy . Urine showed about 15 g of protein in 24 hours   Patient is status post CyBorD x 6 cycles He was then on maintenance Velcade and had progression of his AL Amyloidosis. He was then switched to Ixazomib and Dexamethasone and has completed 6 cycles without significant toxicities. Echo repeated on 12/11/2016 shows no changes in heart function compared to previous echo from earlier this year.  Myeloma panel/SFLC WNL with no evidence of progressive disease. ECHO repeated on 05/02/17 shows Normal LV size with moderate LV hypertrophy. EF  60-65%. Bull&'s eye strain pattern can be seen with cardiac amyloidosis. Normal RV size and systolic function. Mild aortic stenosis. Trivial pericardial effusion. This study is suggestive of cardiac amyloidosis.  Maintenance Ixazomib q2weeks since 2017 and tolerating it without any significant new toxicities. Worsened multifactorial neuropathy. Held Ixazomib since 03/14/17.     2) Thrombocytopenia mild - will continue to monitor  Plan:  -Pt remains in serologic remission based on labs today -Has grade 2 neuropathy from previous Velcade/B12 def /HD with additional radiculopathy due to back issues. -given his persistent and bothersome neuropathy he has elected to hold his Ixazomib since 03/14/17. Continue to hold Ixazomib until neuropathy and back pain is better controlled. No overt prohibitive toxicity from Ixazomib. -myeloma panel from 09/17/2017 for M spike not detectable and serum free light chain ration WNL.  -continue on low-dose aspirin for VTE prophylaxis given his high risk of venous thromboembolism in the setting of nephrotic syndrome.  -Discussed pt labwork today, 09/16/17; HGB at 9.0, PLT at 103k -Okay to stop Acyclovir  -Will repeat ECHO in 6 weeks  3) multifactorial anemia - likely multifactorial -from primarily anemia from CKD. HGB at 9.0, trending down B12 WNL -Previously deficient. B12 07/12/2016 -- 1035, B12 WNL today Plan  -continue B12 replacement SL - will adjust dose to maintain B12 levels > 400 -Would recommend to nephrologist that pt be considered for EPO shots or IV Iron as HGB is at 9.0 and is trending down -Pt will be seeing his nephrologist tomorrow and will discuss if EPO is recommended at this time or not   4) CKD stage V-related to hypertension and AL Amyloidosis. Has been started on hemodialysis through a tunnel catheter since his last clinic visit. Had AV fistula placement on 12/27/2015  Plan  -Continue hemodialysis as per nephrologist.   5) Grade 1-2 peripheral  neuropathy, worsening recently. Feet tingling and numbness ? Related to AL Amyloidosis vs Velcade vs B12 deficiency vs HD (now)vs radiculopathy from DDD  stable.   EMG/NCS 03/2017- There is a chronic and symmetric axonal sensorimotor polyneuropathy affecting the lower extremities, which was previously not present on his study dated 10/12/2014. These findings are severe in degree electrically.  There is evidence of a superimposed L3-4 radiculopathy affecting bilateral lower extremities, and worse on the right. Plan  -I again suggested the QUELL unit to reduce pain sensations from neuropathy. I also recommended acupuncture. -continue SL B12 -f/u with neurology for EMG/NCS -continue f/u with [facility] for his back pain and DDD - Oxycodone  prn -Will continue with same doses of Gabapentin and Amyitriptyline; Gabapentin '300mg'$  in the morning and '600mg'$  at night. - Per Dr Deterdine   ECHO in 6 weeks RTC with Dr [name] in 2 months with labs   All questions were answered. The patient knows to call the clinic with any problems, questions or concerns.  The total time spent in the appt was 30 minutes and more than 50% was on counseling and direct patient cares.    Sullivan Lone MD MS Hematology/Oncology Physician [facility]  (Office):       [phone] (Work cell):  [phone] (Fax):           [phone]  I, Baldwin Jamaica, am acting as a scribe for Dr [name].   .I have reviewed the above documentation for accuracy and completeness, and I agree with the above. [name] MD

## 2017-09-16 DIAGNOSIS — N2581 Secondary hyperparathyroidism of renal origin: Secondary | ICD-10-CM | POA: Diagnosis not present

## 2017-09-16 DIAGNOSIS — H2512 Age-related nuclear cataract, left eye: Secondary | ICD-10-CM | POA: Diagnosis not present

## 2017-09-16 DIAGNOSIS — H2513 Age-related nuclear cataract, bilateral: Secondary | ICD-10-CM | POA: Diagnosis not present

## 2017-09-16 DIAGNOSIS — D509 Iron deficiency anemia, unspecified: Secondary | ICD-10-CM | POA: Diagnosis not present

## 2017-09-16 DIAGNOSIS — D631 Anemia in chronic kidney disease: Secondary | ICD-10-CM | POA: Diagnosis not present

## 2017-09-16 DIAGNOSIS — N186 End stage renal disease: Secondary | ICD-10-CM | POA: Diagnosis not present

## 2017-09-17 ENCOUNTER — Inpatient Hospital Stay: Payer: Medicare Other

## 2017-09-17 ENCOUNTER — Telehealth: Payer: Self-pay | Admitting: Hematology

## 2017-09-17 ENCOUNTER — Inpatient Hospital Stay: Payer: Medicare Other | Attending: Hematology | Admitting: Hematology

## 2017-09-17 ENCOUNTER — Telehealth: Payer: Self-pay

## 2017-09-17 VITALS — BP 179/70 | HR 79 | Temp 98.1°F | Resp 18 | Ht 70.0 in | Wt 204.0 lb

## 2017-09-17 DIAGNOSIS — D649 Anemia, unspecified: Secondary | ICD-10-CM

## 2017-09-17 DIAGNOSIS — I350 Nonrheumatic aortic (valve) stenosis: Secondary | ICD-10-CM | POA: Diagnosis not present

## 2017-09-17 DIAGNOSIS — Z7982 Long term (current) use of aspirin: Secondary | ICD-10-CM | POA: Diagnosis not present

## 2017-09-17 DIAGNOSIS — E785 Hyperlipidemia, unspecified: Secondary | ICD-10-CM

## 2017-09-17 DIAGNOSIS — E8581 Light chain (AL) amyloidosis: Secondary | ICD-10-CM | POA: Diagnosis not present

## 2017-09-17 DIAGNOSIS — D696 Thrombocytopenia, unspecified: Secondary | ICD-10-CM

## 2017-09-17 DIAGNOSIS — E538 Deficiency of other specified B group vitamins: Secondary | ICD-10-CM

## 2017-09-17 DIAGNOSIS — I5032 Chronic diastolic (congestive) heart failure: Secondary | ICD-10-CM

## 2017-09-17 DIAGNOSIS — Z79899 Other long term (current) drug therapy: Secondary | ICD-10-CM

## 2017-09-17 DIAGNOSIS — I132 Hypertensive heart and chronic kidney disease with heart failure and with stage 5 chronic kidney disease, or end stage renal disease: Secondary | ICD-10-CM

## 2017-09-17 DIAGNOSIS — G2581 Restless legs syndrome: Secondary | ICD-10-CM | POA: Diagnosis not present

## 2017-09-17 DIAGNOSIS — E8809 Other disorders of plasma-protein metabolism, not elsewhere classified: Secondary | ICD-10-CM

## 2017-09-17 DIAGNOSIS — Z992 Dependence on renal dialysis: Secondary | ICD-10-CM

## 2017-09-17 DIAGNOSIS — Z9221 Personal history of antineoplastic chemotherapy: Secondary | ICD-10-CM | POA: Diagnosis not present

## 2017-09-17 DIAGNOSIS — K219 Gastro-esophageal reflux disease without esophagitis: Secondary | ICD-10-CM | POA: Diagnosis not present

## 2017-09-17 DIAGNOSIS — Z87891 Personal history of nicotine dependence: Secondary | ICD-10-CM | POA: Diagnosis not present

## 2017-09-17 DIAGNOSIS — G629 Polyneuropathy, unspecified: Secondary | ICD-10-CM | POA: Diagnosis not present

## 2017-09-17 LAB — CBC WITH DIFFERENTIAL/PLATELET
Basophils Absolute: 0 10*3/uL (ref 0.0–0.1)
Basophils Relative: 1 %
EOS PCT: 3 %
Eosinophils Absolute: 0.1 10*3/uL (ref 0.0–0.5)
HEMATOCRIT: 26.5 % — AB (ref 38.4–49.9)
Hemoglobin: 9 g/dL — ABNORMAL LOW (ref 13.0–17.1)
LYMPHS ABS: 0.9 10*3/uL (ref 0.9–3.3)
LYMPHS PCT: 21 %
MCH: 35.2 pg — ABNORMAL HIGH (ref 27.2–33.4)
MCHC: 34 g/dL (ref 32.0–36.0)
MCV: 103.5 fL — AB (ref 79.3–98.0)
MONO ABS: 0.4 10*3/uL (ref 0.1–0.9)
Monocytes Relative: 8 %
Neutro Abs: 3 10*3/uL (ref 1.5–6.5)
Neutrophils Relative %: 67 %
PLATELETS: 103 10*3/uL — AB (ref 140–400)
RBC: 2.56 MIL/uL — AB (ref 4.20–5.82)
RDW: 15.6 % — AB (ref 11.0–14.6)
WBC: 4.5 10*3/uL (ref 4.0–10.3)

## 2017-09-17 LAB — CMP (CANCER CENTER ONLY)
ALBUMIN: 3.9 g/dL (ref 3.5–5.0)
ALT: 17 U/L (ref 0–44)
AST: 14 U/L — AB (ref 15–41)
Alkaline Phosphatase: 49 U/L (ref 38–126)
Anion gap: 13 (ref 5–15)
BUN: 29 mg/dL — AB (ref 8–23)
CHLORIDE: 98 mmol/L (ref 98–111)
CO2: 31 mmol/L (ref 22–32)
Calcium: 10 mg/dL (ref 8.9–10.3)
Creatinine: 9.16 mg/dL (ref 0.61–1.24)
GFR, Est AFR Am: 6 mL/min — ABNORMAL LOW (ref >60)
GFR, Estimated: 5 mL/min — ABNORMAL LOW (ref >60)
GLUCOSE: 127 mg/dL — AB (ref 70–99)
POTASSIUM: 3.7 mmol/L (ref 3.5–5.1)
Sodium: 142 mmol/L (ref 135–145)
Total Bilirubin: 0.7 mg/dL (ref 0.3–1.2)
Total Protein: 7.1 g/dL (ref 6.5–8.1)

## 2017-09-17 LAB — VITAMIN B12: VITAMIN B 12: 596 pg/mL (ref 180–914)

## 2017-09-17 NOTE — Telephone Encounter (Signed)
Lab called with critical creatinine of 9.16. Dr. [name] made aware and reviewed CMP results.

## 2017-09-17 NOTE — Telephone Encounter (Signed)
Scheduled appt per 7/9 los - pt aware of appts - no print out wanted - pt my chart active.

## 2017-09-18 DIAGNOSIS — N186 End stage renal disease: Secondary | ICD-10-CM | POA: Diagnosis not present

## 2017-09-18 DIAGNOSIS — D631 Anemia in chronic kidney disease: Secondary | ICD-10-CM | POA: Diagnosis not present

## 2017-09-18 DIAGNOSIS — D509 Iron deficiency anemia, unspecified: Secondary | ICD-10-CM | POA: Diagnosis not present

## 2017-09-18 DIAGNOSIS — N2581 Secondary hyperparathyroidism of renal origin: Secondary | ICD-10-CM | POA: Diagnosis not present

## 2017-09-18 LAB — KAPPA/LAMBDA LIGHT CHAINS
Kappa free light chain: 132.6 mg/L — ABNORMAL HIGH (ref 3.3–19.4)
Kappa, lambda light chain ratio: 1.23 (ref 0.26–1.65)
Lambda free light chains: 108 mg/L — ABNORMAL HIGH (ref 5.7–26.3)

## 2017-09-18 LAB — MULTIPLE MYELOMA PANEL, SERUM
ALPHA2 GLOB SERPL ELPH-MCNC: 0.8 g/dL (ref 0.4–1.0)
Albumin SerPl Elph-Mcnc: 3.8 g/dL (ref 2.9–4.4)
Albumin/Glob SerPl: 1.4 (ref 0.7–1.7)
Alpha 1: 0.2 g/dL (ref 0.0–0.4)
B-GLOBULIN SERPL ELPH-MCNC: 0.8 g/dL (ref 0.7–1.3)
GAMMA GLOB SERPL ELPH-MCNC: 0.9 g/dL (ref 0.4–1.8)
GLOBULIN, TOTAL: 2.8 g/dL (ref 2.2–3.9)
IgA: 110 mg/dL (ref 61–437)
IgG (Immunoglobin G), Serum: 924 mg/dL (ref 700–1600)
IgM (Immunoglobulin M), Srm: 129 mg/dL (ref 20–172)
Total Protein ELP: 6.6 g/dL (ref 6.0–8.5)

## 2017-09-20 DIAGNOSIS — N186 End stage renal disease: Secondary | ICD-10-CM | POA: Diagnosis not present

## 2017-09-20 DIAGNOSIS — N2581 Secondary hyperparathyroidism of renal origin: Secondary | ICD-10-CM | POA: Diagnosis not present

## 2017-09-20 DIAGNOSIS — D509 Iron deficiency anemia, unspecified: Secondary | ICD-10-CM | POA: Diagnosis not present

## 2017-09-20 DIAGNOSIS — D631 Anemia in chronic kidney disease: Secondary | ICD-10-CM | POA: Diagnosis not present

## 2017-09-23 DIAGNOSIS — D509 Iron deficiency anemia, unspecified: Secondary | ICD-10-CM | POA: Diagnosis not present

## 2017-09-23 DIAGNOSIS — N2581 Secondary hyperparathyroidism of renal origin: Secondary | ICD-10-CM | POA: Diagnosis not present

## 2017-09-23 DIAGNOSIS — D631 Anemia in chronic kidney disease: Secondary | ICD-10-CM | POA: Diagnosis not present

## 2017-09-23 DIAGNOSIS — N186 End stage renal disease: Secondary | ICD-10-CM | POA: Diagnosis not present

## 2017-09-25 DIAGNOSIS — N186 End stage renal disease: Secondary | ICD-10-CM | POA: Diagnosis not present

## 2017-09-25 DIAGNOSIS — D509 Iron deficiency anemia, unspecified: Secondary | ICD-10-CM | POA: Diagnosis not present

## 2017-09-25 DIAGNOSIS — N2581 Secondary hyperparathyroidism of renal origin: Secondary | ICD-10-CM | POA: Diagnosis not present

## 2017-09-25 DIAGNOSIS — D631 Anemia in chronic kidney disease: Secondary | ICD-10-CM | POA: Diagnosis not present

## 2017-09-27 DIAGNOSIS — N2581 Secondary hyperparathyroidism of renal origin: Secondary | ICD-10-CM | POA: Diagnosis not present

## 2017-09-27 DIAGNOSIS — N186 End stage renal disease: Secondary | ICD-10-CM | POA: Diagnosis not present

## 2017-09-27 DIAGNOSIS — D509 Iron deficiency anemia, unspecified: Secondary | ICD-10-CM | POA: Diagnosis not present

## 2017-09-27 DIAGNOSIS — D631 Anemia in chronic kidney disease: Secondary | ICD-10-CM | POA: Diagnosis not present

## 2017-09-30 DIAGNOSIS — D631 Anemia in chronic kidney disease: Secondary | ICD-10-CM | POA: Diagnosis not present

## 2017-09-30 DIAGNOSIS — N2581 Secondary hyperparathyroidism of renal origin: Secondary | ICD-10-CM | POA: Diagnosis not present

## 2017-09-30 DIAGNOSIS — N186 End stage renal disease: Secondary | ICD-10-CM | POA: Diagnosis not present

## 2017-09-30 DIAGNOSIS — D509 Iron deficiency anemia, unspecified: Secondary | ICD-10-CM | POA: Diagnosis not present

## 2017-10-01 ENCOUNTER — Telehealth: Payer: Self-pay

## 2017-10-01 NOTE — Telephone Encounter (Signed)
Per message received from Dr. [name], call placed to patient to let him know that his M spike and SFLC ratio are still WNL suggesting continued serologic remission for his AL Amyloidosis. Patient verbalized understanding and was appreciative.

## 2017-10-02 ENCOUNTER — Other Ambulatory Visit: Payer: Self-pay | Admitting: Hematology

## 2017-10-02 DIAGNOSIS — D509 Iron deficiency anemia, unspecified: Secondary | ICD-10-CM | POA: Diagnosis not present

## 2017-10-02 DIAGNOSIS — N2581 Secondary hyperparathyroidism of renal origin: Secondary | ICD-10-CM | POA: Diagnosis not present

## 2017-10-02 DIAGNOSIS — D631 Anemia in chronic kidney disease: Secondary | ICD-10-CM | POA: Diagnosis not present

## 2017-10-02 DIAGNOSIS — N186 End stage renal disease: Secondary | ICD-10-CM | POA: Diagnosis not present

## 2017-10-02 MED ORDER — OXYCODONE HCL 5 MG PO TABS: 5.0000 mg | ORAL_TABLET | Freq: Four times a day (QID) | ORAL | 0 refills | Status: DC | PRN

## 2017-10-04 DIAGNOSIS — N2581 Secondary hyperparathyroidism of renal origin: Secondary | ICD-10-CM | POA: Diagnosis not present

## 2017-10-04 DIAGNOSIS — D509 Iron deficiency anemia, unspecified: Secondary | ICD-10-CM | POA: Diagnosis not present

## 2017-10-04 DIAGNOSIS — D631 Anemia in chronic kidney disease: Secondary | ICD-10-CM | POA: Diagnosis not present

## 2017-10-04 DIAGNOSIS — N186 End stage renal disease: Secondary | ICD-10-CM | POA: Diagnosis not present

## 2017-10-07 DIAGNOSIS — D509 Iron deficiency anemia, unspecified: Secondary | ICD-10-CM | POA: Diagnosis not present

## 2017-10-07 DIAGNOSIS — N186 End stage renal disease: Secondary | ICD-10-CM | POA: Diagnosis not present

## 2017-10-07 DIAGNOSIS — D631 Anemia in chronic kidney disease: Secondary | ICD-10-CM | POA: Diagnosis not present

## 2017-10-07 DIAGNOSIS — N2581 Secondary hyperparathyroidism of renal origin: Secondary | ICD-10-CM | POA: Diagnosis not present

## 2017-10-09 DIAGNOSIS — D631 Anemia in chronic kidney disease: Secondary | ICD-10-CM | POA: Diagnosis not present

## 2017-10-09 DIAGNOSIS — D509 Iron deficiency anemia, unspecified: Secondary | ICD-10-CM | POA: Diagnosis not present

## 2017-10-09 DIAGNOSIS — N2581 Secondary hyperparathyroidism of renal origin: Secondary | ICD-10-CM | POA: Diagnosis not present

## 2017-10-09 DIAGNOSIS — N186 End stage renal disease: Secondary | ICD-10-CM | POA: Diagnosis not present

## 2017-10-10 DIAGNOSIS — I129 Hypertensive chronic kidney disease with stage 1 through stage 4 chronic kidney disease, or unspecified chronic kidney disease: Secondary | ICD-10-CM | POA: Diagnosis not present

## 2017-10-10 DIAGNOSIS — Z992 Dependence on renal dialysis: Secondary | ICD-10-CM | POA: Diagnosis not present

## 2017-10-10 DIAGNOSIS — N186 End stage renal disease: Secondary | ICD-10-CM | POA: Diagnosis not present

## 2017-10-11 DIAGNOSIS — N2581 Secondary hyperparathyroidism of renal origin: Secondary | ICD-10-CM | POA: Diagnosis not present

## 2017-10-11 DIAGNOSIS — D509 Iron deficiency anemia, unspecified: Secondary | ICD-10-CM | POA: Diagnosis not present

## 2017-10-11 DIAGNOSIS — D631 Anemia in chronic kidney disease: Secondary | ICD-10-CM | POA: Diagnosis not present

## 2017-10-11 DIAGNOSIS — N186 End stage renal disease: Secondary | ICD-10-CM | POA: Diagnosis not present

## 2017-10-12 ENCOUNTER — Encounter (HOSPITAL_COMMUNITY): Payer: Self-pay | Admitting: Emergency Medicine

## 2017-10-12 ENCOUNTER — Other Ambulatory Visit: Payer: Self-pay

## 2017-10-12 ENCOUNTER — Emergency Department (HOSPITAL_COMMUNITY): Payer: Medicare Other

## 2017-10-12 ENCOUNTER — Emergency Department (HOSPITAL_COMMUNITY)
Admission: EM | Admit: 2017-10-12 | Discharge: 2017-10-12 | Disposition: A | Discharge status: Home / Self Care | Payer: Medicare Other | Attending: Emergency Medicine | Admitting: Emergency Medicine

## 2017-10-12 DIAGNOSIS — Z87891 Personal history of nicotine dependence: Secondary | ICD-10-CM | POA: Diagnosis not present

## 2017-10-12 DIAGNOSIS — Z79899 Other long term (current) drug therapy: Secondary | ICD-10-CM | POA: Diagnosis not present

## 2017-10-12 DIAGNOSIS — R0789 Other chest pain: Secondary | ICD-10-CM | POA: Diagnosis not present

## 2017-10-12 DIAGNOSIS — R079 Chest pain, unspecified: Secondary | ICD-10-CM | POA: Diagnosis not present

## 2017-10-12 DIAGNOSIS — N186 End stage renal disease: Secondary | ICD-10-CM | POA: Diagnosis not present

## 2017-10-12 DIAGNOSIS — I10 Essential (primary) hypertension: Secondary | ICD-10-CM | POA: Diagnosis not present

## 2017-10-12 DIAGNOSIS — S4991XA Unspecified injury of right shoulder and upper arm, initial encounter: Secondary | ICD-10-CM | POA: Diagnosis not present

## 2017-10-12 DIAGNOSIS — Z7982 Long term (current) use of aspirin: Secondary | ICD-10-CM | POA: Diagnosis not present

## 2017-10-12 DIAGNOSIS — I132 Hypertensive heart and chronic kidney disease with heart failure and with stage 5 chronic kidney disease, or end stage renal disease: Secondary | ICD-10-CM | POA: Diagnosis not present

## 2017-10-12 DIAGNOSIS — M25511 Pain in right shoulder: Secondary | ICD-10-CM | POA: Insufficient documentation

## 2017-10-12 DIAGNOSIS — I5033 Acute on chronic diastolic (congestive) heart failure: Secondary | ICD-10-CM | POA: Diagnosis not present

## 2017-10-12 DIAGNOSIS — M549 Dorsalgia, unspecified: Secondary | ICD-10-CM | POA: Diagnosis present

## 2017-10-12 DIAGNOSIS — S299XXA Unspecified injury of thorax, initial encounter: Secondary | ICD-10-CM | POA: Diagnosis not present

## 2017-10-12 LAB — BASIC METABOLIC PANEL
ANION GAP: 15 (ref 5–15)
BUN: 23 mg/dL (ref 8–23)
CALCIUM: 10.3 mg/dL (ref 8.9–10.3)
CHLORIDE: 97 mmol/L — AB (ref 98–111)
CO2: 30 mmol/L (ref 22–32)
Creatinine, Ser: 7.78 mg/dL — ABNORMAL HIGH (ref 0.61–1.24)
GFR, EST AFRICAN AMERICAN: 7 mL/min — AB (ref >60)
GFR, EST NON AFRICAN AMERICAN: 6 mL/min — AB (ref >60)
GLUCOSE: 94 mg/dL (ref 70–99)
POTASSIUM: 3.5 mmol/L (ref 3.5–5.1)
Sodium: 142 mmol/L (ref 135–145)

## 2017-10-12 LAB — CBC
HEMATOCRIT: 29.9 % — AB (ref 39.0–52.0)
Hemoglobin: 9.6 g/dL — ABNORMAL LOW (ref 13.0–17.0)
MCH: 34 pg (ref 26.0–34.0)
MCHC: 32.1 g/dL (ref 30.0–36.0)
MCV: 106 fL — ABNORMAL HIGH (ref 78.0–100.0)
Platelets: 98 10*3/uL — ABNORMAL LOW (ref 150–400)
RBC: 2.82 MIL/uL — ABNORMAL LOW (ref 4.22–5.81)
RDW: 15 % (ref 11.5–15.5)
WBC: 5.3 10*3/uL (ref 4.0–10.5)

## 2017-10-12 LAB — I-STAT CHEM 8, ED
BUN: 23 mg/dL (ref 8–23)
CALCIUM ION: 1.23 mmol/L (ref 1.15–1.40)
CHLORIDE: 96 mmol/L — AB (ref 98–111)
CREATININE: 8.1 mg/dL — AB (ref 0.61–1.24)
GLUCOSE: 90 mg/dL (ref 70–99)
HCT: 27 % — ABNORMAL LOW (ref 39.0–52.0)
Hemoglobin: 9.2 g/dL — ABNORMAL LOW (ref 13.0–17.0)
Potassium: 3.4 mmol/L — ABNORMAL LOW (ref 3.5–5.1)
Sodium: 141 mmol/L (ref 135–145)
TCO2: 33 mmol/L — ABNORMAL HIGH (ref 22–32)

## 2017-10-12 LAB — I-STAT TROPONIN, ED
TROPONIN I, POC: 0.07 ng/mL (ref 0.00–0.08)
TROPONIN I, POC: 0.08 ng/mL (ref 0.00–0.08)

## 2017-10-12 MED ORDER — HYDROCODONE-ACETAMINOPHEN 5-325 MG PO TABS
1.0000 | ORAL_TABLET | ORAL | Status: AC
  Administered 2017-10-12: 1 via ORAL
  Filled 2017-10-12: qty 1
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
## 2017-10-12 NOTE — Discharge Instructions (Addendum)
X-rays did not show any evidence of fracture, take over-the-counter medications as needed for pain, apply ice to help with the pain and discomfort, return as needed for severe pain, difficulty breathing, abdominal pain or other concerns

## 2017-10-14 DIAGNOSIS — D509 Iron deficiency anemia, unspecified: Secondary | ICD-10-CM | POA: Diagnosis not present

## 2017-10-14 DIAGNOSIS — N2581 Secondary hyperparathyroidism of renal origin: Secondary | ICD-10-CM | POA: Diagnosis not present

## 2017-10-14 DIAGNOSIS — D631 Anemia in chronic kidney disease: Secondary | ICD-10-CM | POA: Diagnosis not present

## 2017-10-14 DIAGNOSIS — N186 End stage renal disease: Secondary | ICD-10-CM | POA: Diagnosis not present

## 2017-10-15 ENCOUNTER — Ambulatory Visit (HOSPITAL_COMMUNITY)
Admission: RE | Admit: 2017-10-15 | Discharge: 2017-10-15 | Disposition: A | Discharge status: Home / Self Care | Payer: Medicare Other | Source: Ambulatory Visit | Attending: Hematology | Admitting: Hematology

## 2017-10-15 DIAGNOSIS — N186 End stage renal disease: Secondary | ICD-10-CM | POA: Diagnosis not present

## 2017-10-15 DIAGNOSIS — I509 Heart failure, unspecified: Secondary | ICD-10-CM | POA: Diagnosis not present

## 2017-10-15 DIAGNOSIS — E8581 Light chain (AL) amyloidosis: Secondary | ICD-10-CM | POA: Insufficient documentation

## 2017-10-15 DIAGNOSIS — I352 Nonrheumatic aortic (valve) stenosis with insufficiency: Secondary | ICD-10-CM | POA: Insufficient documentation

## 2017-10-15 DIAGNOSIS — I132 Hypertensive heart and chronic kidney disease with heart failure and with stage 5 chronic kidney disease, or end stage renal disease: Secondary | ICD-10-CM | POA: Insufficient documentation

## 2017-10-15 NOTE — Progress Notes (Signed)
  Echocardiogram 2D Echocardiogram has been performed.  Yanai Hobson L Androw 10/15/2017, 10:09 AM

## 2017-10-16 DIAGNOSIS — N2581 Secondary hyperparathyroidism of renal origin: Secondary | ICD-10-CM | POA: Diagnosis not present

## 2017-10-16 DIAGNOSIS — N186 End stage renal disease: Secondary | ICD-10-CM | POA: Diagnosis not present

## 2017-10-16 DIAGNOSIS — D509 Iron deficiency anemia, unspecified: Secondary | ICD-10-CM | POA: Diagnosis not present

## 2017-10-16 DIAGNOSIS — D631 Anemia in chronic kidney disease: Secondary | ICD-10-CM | POA: Diagnosis not present

## 2017-10-18 DIAGNOSIS — N186 End stage renal disease: Secondary | ICD-10-CM | POA: Diagnosis not present

## 2017-10-18 DIAGNOSIS — D631 Anemia in chronic kidney disease: Secondary | ICD-10-CM | POA: Diagnosis not present

## 2017-10-18 DIAGNOSIS — D509 Iron deficiency anemia, unspecified: Secondary | ICD-10-CM | POA: Diagnosis not present

## 2017-10-18 DIAGNOSIS — N2581 Secondary hyperparathyroidism of renal origin: Secondary | ICD-10-CM | POA: Diagnosis not present

## 2017-10-21 DIAGNOSIS — N186 End stage renal disease: Secondary | ICD-10-CM | POA: Diagnosis not present

## 2017-10-21 DIAGNOSIS — D509 Iron deficiency anemia, unspecified: Secondary | ICD-10-CM | POA: Diagnosis not present

## 2017-10-21 DIAGNOSIS — D631 Anemia in chronic kidney disease: Secondary | ICD-10-CM | POA: Diagnosis not present

## 2017-10-21 DIAGNOSIS — N2581 Secondary hyperparathyroidism of renal origin: Secondary | ICD-10-CM | POA: Diagnosis not present

## 2017-10-23 DIAGNOSIS — N2581 Secondary hyperparathyroidism of renal origin: Secondary | ICD-10-CM | POA: Diagnosis not present

## 2017-10-23 DIAGNOSIS — D509 Iron deficiency anemia, unspecified: Secondary | ICD-10-CM | POA: Diagnosis not present

## 2017-10-23 DIAGNOSIS — N186 End stage renal disease: Secondary | ICD-10-CM | POA: Diagnosis not present

## 2017-10-23 DIAGNOSIS — D631 Anemia in chronic kidney disease: Secondary | ICD-10-CM | POA: Diagnosis not present

## 2017-10-25 DIAGNOSIS — D509 Iron deficiency anemia, unspecified: Secondary | ICD-10-CM | POA: Diagnosis not present

## 2017-10-25 DIAGNOSIS — N186 End stage renal disease: Secondary | ICD-10-CM | POA: Diagnosis not present

## 2017-10-25 DIAGNOSIS — D631 Anemia in chronic kidney disease: Secondary | ICD-10-CM | POA: Diagnosis not present

## 2017-10-25 DIAGNOSIS — N2581 Secondary hyperparathyroidism of renal origin: Secondary | ICD-10-CM | POA: Diagnosis not present

## 2017-10-28 DIAGNOSIS — N186 End stage renal disease: Secondary | ICD-10-CM | POA: Diagnosis not present

## 2017-10-28 DIAGNOSIS — D509 Iron deficiency anemia, unspecified: Secondary | ICD-10-CM | POA: Diagnosis not present

## 2017-10-28 DIAGNOSIS — N2581 Secondary hyperparathyroidism of renal origin: Secondary | ICD-10-CM | POA: Diagnosis not present

## 2017-10-28 DIAGNOSIS — D631 Anemia in chronic kidney disease: Secondary | ICD-10-CM | POA: Diagnosis not present

## 2017-10-30 DIAGNOSIS — N186 End stage renal disease: Secondary | ICD-10-CM | POA: Diagnosis not present

## 2017-10-30 DIAGNOSIS — N2581 Secondary hyperparathyroidism of renal origin: Secondary | ICD-10-CM | POA: Diagnosis not present

## 2017-10-30 DIAGNOSIS — D509 Iron deficiency anemia, unspecified: Secondary | ICD-10-CM | POA: Diagnosis not present

## 2017-10-30 DIAGNOSIS — D631 Anemia in chronic kidney disease: Secondary | ICD-10-CM | POA: Diagnosis not present

## 2017-10-31 DIAGNOSIS — H2512 Age-related nuclear cataract, left eye: Secondary | ICD-10-CM | POA: Diagnosis not present

## 2017-11-01 DIAGNOSIS — N2581 Secondary hyperparathyroidism of renal origin: Secondary | ICD-10-CM | POA: Diagnosis not present

## 2017-11-01 DIAGNOSIS — D509 Iron deficiency anemia, unspecified: Secondary | ICD-10-CM | POA: Diagnosis not present

## 2017-11-01 DIAGNOSIS — D631 Anemia in chronic kidney disease: Secondary | ICD-10-CM | POA: Diagnosis not present

## 2017-11-01 DIAGNOSIS — N186 End stage renal disease: Secondary | ICD-10-CM | POA: Diagnosis not present

## 2017-11-01 DIAGNOSIS — H25011 Cortical age-related cataract, right eye: Secondary | ICD-10-CM | POA: Diagnosis not present

## 2017-11-04 DIAGNOSIS — N2581 Secondary hyperparathyroidism of renal origin: Secondary | ICD-10-CM | POA: Diagnosis not present

## 2017-11-04 DIAGNOSIS — D631 Anemia in chronic kidney disease: Secondary | ICD-10-CM | POA: Diagnosis not present

## 2017-11-04 DIAGNOSIS — D509 Iron deficiency anemia, unspecified: Secondary | ICD-10-CM | POA: Diagnosis not present

## 2017-11-04 DIAGNOSIS — N186 End stage renal disease: Secondary | ICD-10-CM | POA: Diagnosis not present

## 2017-11-06 DIAGNOSIS — N186 End stage renal disease: Secondary | ICD-10-CM | POA: Diagnosis not present

## 2017-11-06 DIAGNOSIS — D631 Anemia in chronic kidney disease: Secondary | ICD-10-CM | POA: Diagnosis not present

## 2017-11-06 DIAGNOSIS — D509 Iron deficiency anemia, unspecified: Secondary | ICD-10-CM | POA: Diagnosis not present

## 2017-11-06 DIAGNOSIS — N2581 Secondary hyperparathyroidism of renal origin: Secondary | ICD-10-CM | POA: Diagnosis not present

## 2017-11-08 DIAGNOSIS — N186 End stage renal disease: Secondary | ICD-10-CM | POA: Diagnosis not present

## 2017-11-08 DIAGNOSIS — N2581 Secondary hyperparathyroidism of renal origin: Secondary | ICD-10-CM | POA: Diagnosis not present

## 2017-11-08 DIAGNOSIS — D509 Iron deficiency anemia, unspecified: Secondary | ICD-10-CM | POA: Diagnosis not present

## 2017-11-08 DIAGNOSIS — D631 Anemia in chronic kidney disease: Secondary | ICD-10-CM | POA: Diagnosis not present

## 2017-11-10 DIAGNOSIS — I129 Hypertensive chronic kidney disease with stage 1 through stage 4 chronic kidney disease, or unspecified chronic kidney disease: Secondary | ICD-10-CM | POA: Diagnosis not present

## 2017-11-10 DIAGNOSIS — Z992 Dependence on renal dialysis: Secondary | ICD-10-CM | POA: Diagnosis not present

## 2017-11-10 DIAGNOSIS — N186 End stage renal disease: Secondary | ICD-10-CM | POA: Diagnosis not present

## 2017-11-10 DIAGNOSIS — I219 Acute myocardial infarction, unspecified: Secondary | ICD-10-CM

## 2017-11-10 HISTORY — DX: Acute myocardial infarction, unspecified: I21.9

## 2017-11-11 DIAGNOSIS — N186 End stage renal disease: Secondary | ICD-10-CM | POA: Diagnosis not present

## 2017-11-11 DIAGNOSIS — D509 Iron deficiency anemia, unspecified: Secondary | ICD-10-CM | POA: Diagnosis not present

## 2017-11-11 DIAGNOSIS — N2581 Secondary hyperparathyroidism of renal origin: Secondary | ICD-10-CM | POA: Diagnosis not present

## 2017-11-11 DIAGNOSIS — D631 Anemia in chronic kidney disease: Secondary | ICD-10-CM | POA: Diagnosis not present

## 2017-11-11 DIAGNOSIS — Z23 Encounter for immunization: Secondary | ICD-10-CM | POA: Diagnosis not present

## 2017-11-13 DIAGNOSIS — N186 End stage renal disease: Secondary | ICD-10-CM | POA: Diagnosis not present

## 2017-11-13 DIAGNOSIS — N2581 Secondary hyperparathyroidism of renal origin: Secondary | ICD-10-CM | POA: Diagnosis not present

## 2017-11-13 DIAGNOSIS — D631 Anemia in chronic kidney disease: Secondary | ICD-10-CM | POA: Diagnosis not present

## 2017-11-13 DIAGNOSIS — Z23 Encounter for immunization: Secondary | ICD-10-CM | POA: Diagnosis not present

## 2017-11-13 DIAGNOSIS — D509 Iron deficiency anemia, unspecified: Secondary | ICD-10-CM | POA: Diagnosis not present

## 2017-11-14 DIAGNOSIS — H2511 Age-related nuclear cataract, right eye: Secondary | ICD-10-CM | POA: Diagnosis not present

## 2017-11-15 DIAGNOSIS — N186 End stage renal disease: Secondary | ICD-10-CM | POA: Diagnosis not present

## 2017-11-15 DIAGNOSIS — D631 Anemia in chronic kidney disease: Secondary | ICD-10-CM | POA: Diagnosis not present

## 2017-11-15 DIAGNOSIS — N2581 Secondary hyperparathyroidism of renal origin: Secondary | ICD-10-CM | POA: Diagnosis not present

## 2017-11-15 DIAGNOSIS — Z23 Encounter for immunization: Secondary | ICD-10-CM | POA: Diagnosis not present

## 2017-11-15 DIAGNOSIS — D509 Iron deficiency anemia, unspecified: Secondary | ICD-10-CM | POA: Diagnosis not present

## 2017-11-18 DIAGNOSIS — N186 End stage renal disease: Secondary | ICD-10-CM | POA: Diagnosis not present

## 2017-11-18 DIAGNOSIS — D631 Anemia in chronic kidney disease: Secondary | ICD-10-CM | POA: Diagnosis not present

## 2017-11-18 DIAGNOSIS — Z23 Encounter for immunization: Secondary | ICD-10-CM | POA: Diagnosis not present

## 2017-11-18 DIAGNOSIS — D509 Iron deficiency anemia, unspecified: Secondary | ICD-10-CM | POA: Diagnosis not present

## 2017-11-18 DIAGNOSIS — N2581 Secondary hyperparathyroidism of renal origin: Secondary | ICD-10-CM | POA: Diagnosis not present

## 2017-11-20 DIAGNOSIS — N2581 Secondary hyperparathyroidism of renal origin: Secondary | ICD-10-CM | POA: Diagnosis not present

## 2017-11-20 DIAGNOSIS — D509 Iron deficiency anemia, unspecified: Secondary | ICD-10-CM | POA: Diagnosis not present

## 2017-11-20 DIAGNOSIS — N186 End stage renal disease: Secondary | ICD-10-CM | POA: Diagnosis not present

## 2017-11-20 DIAGNOSIS — D631 Anemia in chronic kidney disease: Secondary | ICD-10-CM | POA: Diagnosis not present

## 2017-11-20 DIAGNOSIS — Z23 Encounter for immunization: Secondary | ICD-10-CM | POA: Diagnosis not present

## 2017-11-21 ENCOUNTER — Inpatient Hospital Stay: Payer: Medicare Other

## 2017-11-21 ENCOUNTER — Inpatient Hospital Stay: Payer: Medicare Other | Attending: Hematology | Admitting: Hematology

## 2017-11-22 DIAGNOSIS — Z23 Encounter for immunization: Secondary | ICD-10-CM | POA: Diagnosis not present

## 2017-11-22 DIAGNOSIS — N2581 Secondary hyperparathyroidism of renal origin: Secondary | ICD-10-CM | POA: Diagnosis not present

## 2017-11-22 DIAGNOSIS — D509 Iron deficiency anemia, unspecified: Secondary | ICD-10-CM | POA: Diagnosis not present

## 2017-11-22 DIAGNOSIS — D631 Anemia in chronic kidney disease: Secondary | ICD-10-CM | POA: Diagnosis not present

## 2017-11-22 DIAGNOSIS — N186 End stage renal disease: Secondary | ICD-10-CM | POA: Diagnosis not present

## 2017-11-25 ENCOUNTER — Inpatient Hospital Stay (HOSPITAL_COMMUNITY): Payer: Medicare Other

## 2017-11-25 ENCOUNTER — Encounter (HOSPITAL_COMMUNITY): Payer: Self-pay

## 2017-11-25 ENCOUNTER — Emergency Department (HOSPITAL_COMMUNITY): Payer: Medicare Other

## 2017-11-25 ENCOUNTER — Inpatient Hospital Stay (HOSPITAL_COMMUNITY)
Admission: EM | Admit: 2017-11-25 | Discharge: 2017-11-30 | DRG: 280 | Disposition: A | Discharge status: Home / Self Care | Payer: Medicare Other | Attending: Cardiology | Admitting: Cardiology

## 2017-11-25 ENCOUNTER — Other Ambulatory Visit: Payer: Self-pay

## 2017-11-25 ENCOUNTER — Encounter (HOSPITAL_COMMUNITY)
Admission: EM | Disposition: A | Discharge status: Home / Self Care | Payer: Self-pay | Source: Home / Self Care | Attending: Cardiovascular Disease

## 2017-11-25 DIAGNOSIS — I350 Nonrheumatic aortic (valve) stenosis: Secondary | ICD-10-CM | POA: Diagnosis not present

## 2017-11-25 DIAGNOSIS — R7989 Other specified abnormal findings of blood chemistry: Secondary | ICD-10-CM | POA: Diagnosis present

## 2017-11-25 DIAGNOSIS — Z9889 Other specified postprocedural states: Secondary | ICD-10-CM

## 2017-11-25 DIAGNOSIS — J810 Acute pulmonary edema: Secondary | ICD-10-CM

## 2017-11-25 DIAGNOSIS — I132 Hypertensive heart and chronic kidney disease with heart failure and with stage 5 chronic kidney disease, or end stage renal disease: Secondary | ICD-10-CM | POA: Diagnosis present

## 2017-11-25 DIAGNOSIS — I214 Non-ST elevation (NSTEMI) myocardial infarction: Secondary | ICD-10-CM | POA: Diagnosis not present

## 2017-11-25 DIAGNOSIS — R5381 Other malaise: Secondary | ICD-10-CM | POA: Diagnosis not present

## 2017-11-25 DIAGNOSIS — I4510 Unspecified right bundle-branch block: Secondary | ICD-10-CM | POA: Diagnosis present

## 2017-11-25 DIAGNOSIS — R531 Weakness: Secondary | ICD-10-CM | POA: Diagnosis not present

## 2017-11-25 DIAGNOSIS — I313 Pericardial effusion (noninflammatory): Secondary | ICD-10-CM | POA: Diagnosis present

## 2017-11-25 DIAGNOSIS — Z7982 Long term (current) use of aspirin: Secondary | ICD-10-CM | POA: Diagnosis not present

## 2017-11-25 DIAGNOSIS — Z992 Dependence on renal dialysis: Secondary | ICD-10-CM

## 2017-11-25 DIAGNOSIS — R5383 Other fatigue: Secondary | ICD-10-CM | POA: Diagnosis not present

## 2017-11-25 DIAGNOSIS — G2581 Restless legs syndrome: Secondary | ICD-10-CM | POA: Diagnosis present

## 2017-11-25 DIAGNOSIS — I509 Heart failure, unspecified: Secondary | ICD-10-CM | POA: Diagnosis not present

## 2017-11-25 DIAGNOSIS — I10 Essential (primary) hypertension: Secondary | ICD-10-CM | POA: Diagnosis not present

## 2017-11-25 DIAGNOSIS — R05 Cough: Secondary | ICD-10-CM | POA: Diagnosis not present

## 2017-11-25 DIAGNOSIS — Z79899 Other long term (current) drug therapy: Secondary | ICD-10-CM | POA: Diagnosis not present

## 2017-11-25 DIAGNOSIS — E781 Pure hyperglyceridemia: Secondary | ICD-10-CM | POA: Diagnosis present

## 2017-11-25 DIAGNOSIS — J90 Pleural effusion, not elsewhere classified: Secondary | ICD-10-CM | POA: Diagnosis not present

## 2017-11-25 DIAGNOSIS — I130 Hypertensive heart and chronic kidney disease with heart failure and stage 1 through stage 4 chronic kidney disease, or unspecified chronic kidney disease: Secondary | ICD-10-CM | POA: Diagnosis not present

## 2017-11-25 DIAGNOSIS — N186 End stage renal disease: Secondary | ICD-10-CM | POA: Diagnosis not present

## 2017-11-25 DIAGNOSIS — K219 Gastro-esophageal reflux disease without esophagitis: Secondary | ICD-10-CM | POA: Diagnosis present

## 2017-11-25 DIAGNOSIS — Z4682 Encounter for fitting and adjustment of non-vascular catheter: Secondary | ICD-10-CM | POA: Diagnosis not present

## 2017-11-25 DIAGNOSIS — E8581 Light chain (AL) amyloidosis: Secondary | ICD-10-CM | POA: Diagnosis present

## 2017-11-25 DIAGNOSIS — J9601 Acute respiratory failure with hypoxia: Secondary | ICD-10-CM

## 2017-11-25 DIAGNOSIS — N2581 Secondary hyperparathyroidism of renal origin: Secondary | ICD-10-CM | POA: Diagnosis not present

## 2017-11-25 DIAGNOSIS — I219 Acute myocardial infarction, unspecified: Secondary | ICD-10-CM | POA: Diagnosis not present

## 2017-11-25 DIAGNOSIS — Z9049 Acquired absence of other specified parts of digestive tract: Secondary | ICD-10-CM | POA: Diagnosis not present

## 2017-11-25 DIAGNOSIS — Z9289 Personal history of other medical treatment: Secondary | ICD-10-CM

## 2017-11-25 DIAGNOSIS — Z79891 Long term (current) use of opiate analgesic: Secondary | ICD-10-CM | POA: Diagnosis not present

## 2017-11-25 DIAGNOSIS — I160 Hypertensive urgency: Secondary | ICD-10-CM | POA: Diagnosis present

## 2017-11-25 DIAGNOSIS — I5043 Acute on chronic combined systolic (congestive) and diastolic (congestive) heart failure: Secondary | ICD-10-CM | POA: Diagnosis present

## 2017-11-25 DIAGNOSIS — Z87891 Personal history of nicotine dependence: Secondary | ICD-10-CM

## 2017-11-25 DIAGNOSIS — Z833 Family history of diabetes mellitus: Secondary | ICD-10-CM

## 2017-11-25 DIAGNOSIS — J9690 Respiratory failure, unspecified, unspecified whether with hypoxia or hypercapnia: Secondary | ICD-10-CM

## 2017-11-25 DIAGNOSIS — R778 Other specified abnormalities of plasma proteins: Secondary | ICD-10-CM | POA: Diagnosis present

## 2017-11-25 DIAGNOSIS — J811 Chronic pulmonary edema: Secondary | ICD-10-CM | POA: Diagnosis not present

## 2017-11-25 DIAGNOSIS — I2510 Atherosclerotic heart disease of native coronary artery without angina pectoris: Secondary | ICD-10-CM

## 2017-11-25 DIAGNOSIS — D631 Anemia in chronic kidney disease: Secondary | ICD-10-CM | POA: Diagnosis present

## 2017-11-25 DIAGNOSIS — I5031 Acute diastolic (congestive) heart failure: Secondary | ICD-10-CM | POA: Diagnosis not present

## 2017-11-25 DIAGNOSIS — I249 Acute ischemic heart disease, unspecified: Secondary | ICD-10-CM | POA: Diagnosis not present

## 2017-11-25 DIAGNOSIS — I5041 Acute combined systolic (congestive) and diastolic (congestive) heart failure: Secondary | ICD-10-CM | POA: Diagnosis not present

## 2017-11-25 DIAGNOSIS — Z8249 Family history of ischemic heart disease and other diseases of the circulatory system: Secondary | ICD-10-CM

## 2017-11-25 DIAGNOSIS — R1111 Vomiting without nausea: Secondary | ICD-10-CM | POA: Diagnosis not present

## 2017-11-25 DIAGNOSIS — Z808 Family history of malignant neoplasm of other organs or systems: Secondary | ICD-10-CM

## 2017-11-25 DIAGNOSIS — Z978 Presence of other specified devices: Secondary | ICD-10-CM

## 2017-11-25 DIAGNOSIS — I352 Nonrheumatic aortic (valve) stenosis with insufficiency: Secondary | ICD-10-CM | POA: Diagnosis present

## 2017-11-25 DIAGNOSIS — E785 Hyperlipidemia, unspecified: Secondary | ICD-10-CM | POA: Diagnosis present

## 2017-11-25 DIAGNOSIS — I120 Hypertensive chronic kidney disease with stage 5 chronic kidney disease or end stage renal disease: Secondary | ICD-10-CM | POA: Diagnosis not present

## 2017-11-25 DIAGNOSIS — R918 Other nonspecific abnormal finding of lung field: Secondary | ICD-10-CM | POA: Diagnosis not present

## 2017-11-25 DIAGNOSIS — I25110 Atherosclerotic heart disease of native coronary artery with unstable angina pectoris: Secondary | ICD-10-CM | POA: Diagnosis not present

## 2017-11-25 HISTORY — PX: LEFT HEART CATH AND CORONARY ANGIOGRAPHY: CATH118249

## 2017-11-25 LAB — I-STAT CHEM 8, ED
BUN: 47 mg/dL — AB (ref 8–23)
CREATININE: 12.1 mg/dL — AB (ref 0.61–1.24)
Calcium, Ion: 1.08 mmol/L — ABNORMAL LOW (ref 1.15–1.40)
Chloride: 96 mmol/L — ABNORMAL LOW (ref 98–111)
Glucose, Bld: 128 mg/dL — ABNORMAL HIGH (ref 70–99)
HCT: 38 % — ABNORMAL LOW (ref 39.0–52.0)
Hemoglobin: 12.9 g/dL — ABNORMAL LOW (ref 13.0–17.0)
Potassium: 3.8 mmol/L (ref 3.5–5.1)
Sodium: 136 mmol/L (ref 135–145)
TCO2: 28 mmol/L (ref 22–32)

## 2017-11-25 LAB — POCT I-STAT 3, ART BLOOD GAS (G3+)
Acid-Base Excess: 3 mmol/L — ABNORMAL HIGH (ref 0.0–2.0)
Bicarbonate: 28.4 mmol/L — ABNORMAL HIGH (ref 20.0–28.0)
O2 Saturation: 98 %
PCO2 ART: 48.6 mmHg — AB (ref 32.0–48.0)
PH ART: 7.374 (ref 7.350–7.450)
Patient temperature: 98.6
TCO2: 30 mmol/L (ref 22–32)
pO2, Arterial: 109 mmHg — ABNORMAL HIGH (ref 83.0–108.0)

## 2017-11-25 LAB — COMPREHENSIVE METABOLIC PANEL
ALBUMIN: 3.9 g/dL (ref 3.5–5.0)
ALT: 20 U/L (ref 0–44)
ANION GAP: 19 — AB (ref 5–15)
AST: 49 U/L — AB (ref 15–41)
Alkaline Phosphatase: 55 U/L (ref 38–126)
BUN: 50 mg/dL — AB (ref 8–23)
CHLORIDE: 94 mmol/L — AB (ref 98–111)
CO2: 27 mmol/L (ref 22–32)
Calcium: 9.8 mg/dL (ref 8.9–10.3)
Creatinine, Ser: 11.99 mg/dL — ABNORMAL HIGH (ref 0.61–1.24)
GFR calc Af Amer: 4 mL/min — ABNORMAL LOW (ref >60)
GFR calc non Af Amer: 4 mL/min — ABNORMAL LOW (ref >60)
GLUCOSE: 126 mg/dL — AB (ref 70–99)
POTASSIUM: 4 mmol/L (ref 3.5–5.1)
SODIUM: 140 mmol/L (ref 135–145)
Total Bilirubin: 1.5 mg/dL — ABNORMAL HIGH (ref 0.3–1.2)
Total Protein: 7.2 g/dL (ref 6.5–8.1)

## 2017-11-25 LAB — I-STAT ARTERIAL BLOOD GAS, ED
ACID-BASE EXCESS: 2 mmol/L (ref 0.0–2.0)
BICARBONATE: 25.9 mmol/L (ref 20.0–28.0)
O2 Saturation: 98 %
PH ART: 7.436 (ref 7.350–7.450)
TCO2: 27 mmol/L (ref 22–32)
pCO2 arterial: 38.5 mmHg (ref 32.0–48.0)
pO2, Arterial: 97 mmHg (ref 83.0–108.0)

## 2017-11-25 LAB — CBC WITH DIFFERENTIAL/PLATELET
ABS IMMATURE GRANULOCYTES: 0.1 10*3/uL (ref 0.0–0.1)
BASOS ABS: 0 10*3/uL (ref 0.0–0.1)
Basophils Relative: 0 %
Eosinophils Absolute: 0 10*3/uL (ref 0.0–0.7)
Eosinophils Relative: 0 %
HEMATOCRIT: 37.3 % — AB (ref 39.0–52.0)
HEMOGLOBIN: 11.7 g/dL — AB (ref 13.0–17.0)
Immature Granulocytes: 1 %
LYMPHS ABS: 0.9 10*3/uL (ref 0.7–4.0)
LYMPHS PCT: 10 %
MCH: 31.8 pg (ref 26.0–34.0)
MCHC: 31.4 g/dL (ref 30.0–36.0)
MCV: 101.4 fL — AB (ref 78.0–100.0)
MONO ABS: 0.6 10*3/uL (ref 0.1–1.0)
MONOS PCT: 7 %
Neutro Abs: 7.6 10*3/uL (ref 1.7–7.7)
Neutrophils Relative %: 82 %
Platelets: DECREASED 10*3/uL (ref 150–400)
RBC: 3.68 MIL/uL — ABNORMAL LOW (ref 4.22–5.81)
RDW: 15.6 % — ABNORMAL HIGH (ref 11.5–15.5)
WBC: 9.3 10*3/uL (ref 4.0–10.5)

## 2017-11-25 LAB — TRIGLYCERIDES: TRIGLYCERIDES: 119 mg/dL (ref <150)

## 2017-11-25 LAB — I-STAT TROPONIN, ED
TROPONIN I, POC: 13.34 ng/mL — AB (ref 0.00–0.08)
TROPONIN I, POC: 12.79 ng/mL — AB (ref 0.00–0.08)

## 2017-11-25 LAB — GLUCOSE, CAPILLARY
GLUCOSE-CAPILLARY: 120 mg/dL — AB (ref 70–99)
GLUCOSE-CAPILLARY: 94 mg/dL (ref 70–99)

## 2017-11-25 LAB — TROPONIN I
Troponin I: 16.18 ng/mL (ref <0.03)
Troponin I: 23.24 ng/mL (ref <0.03)

## 2017-11-25 LAB — I-STAT CG4 LACTIC ACID, ED: Lactic Acid, Venous: 1.81 mmol/L (ref 0.5–1.9)

## 2017-11-25 LAB — MRSA PCR SCREENING: MRSA by PCR: NEGATIVE

## 2017-11-25 LAB — BRAIN NATRIURETIC PEPTIDE: B Natriuretic Peptide: 4098 pg/mL — ABNORMAL HIGH (ref 0.0–100.0)

## 2017-11-25 SURGERY — LEFT HEART CATH AND CORONARY ANGIOGRAPHY
Anesthesia: LOCAL

## 2017-11-25 MED ORDER — NITROGLYCERIN IN D5W 200-5 MCG/ML-% IV SOLN: 0.0000 ug/min | INTRAVENOUS | Status: DC

## 2017-11-25 MED ORDER — PROPOFOL 1000 MG/100ML IV EMUL
0.0000 ug/kg/min | INTRAVENOUS | Status: DC
  Administered 2017-11-25 (×2): 10 ug/kg/min via INTRAVENOUS

## 2017-11-25 MED ORDER — SODIUM CHLORIDE 0.9% FLUSH: 3.0000 mL | INTRAVENOUS | Status: DC | PRN

## 2017-11-25 MED ORDER — PROPOFOL 1000 MG/100ML IV EMUL
INTRAVENOUS | Status: AC
  Filled 2017-11-25: qty 100

## 2017-11-25 MED ORDER — FENTANYL CITRATE (PF) 100 MCG/2ML IJ SOLN
INTRAMUSCULAR | Status: AC
  Filled 2017-11-25: qty 2

## 2017-11-25 MED ORDER — ORAL CARE MOUTH RINSE
15.0000 mL | OROMUCOSAL | Status: DC
  Administered 2017-11-25 – 2017-11-26 (×10): 15 mL via OROMUCOSAL

## 2017-11-25 MED ORDER — IPRATROPIUM-ALBUTEROL 0.5-2.5 (3) MG/3ML IN SOLN: 3.0000 mL | RESPIRATORY_TRACT | Status: DC | PRN

## 2017-11-25 MED ORDER — NITROGLYCERIN IN D5W 200-5 MCG/ML-% IV SOLN
0.0000 ug/min | INTRAVENOUS | Status: DC
  Administered 2017-11-25: 20 ug/min via INTRAVENOUS
  Filled 2017-11-25: qty 250

## 2017-11-25 MED ORDER — ASPIRIN 300 MG RE SUPP
300.0000 mg | RECTAL | Status: AC
  Administered 2017-11-25: 300 mg via RECTAL
  Filled 2017-11-25: qty 1

## 2017-11-25 MED ORDER — MIDAZOLAM HCL 2 MG/2ML IJ SOLN
INTRAMUSCULAR | Status: AC | PRN
  Administered 2017-11-25: 2 mg via INTRAVENOUS

## 2017-11-25 MED ORDER — ACETAMINOPHEN 325 MG PO TABS
650.0000 mg | ORAL_TABLET | Freq: Once | ORAL | Status: AC
  Administered 2017-11-25: 650 mg via ORAL
  Filled 2017-11-25: qty 2

## 2017-11-25 MED ORDER — HEPARIN BOLUS VIA INFUSION
4000.0000 [IU] | Freq: Once | INTRAVENOUS | Status: DC
  Filled 2017-11-25: qty 4000

## 2017-11-25 MED ORDER — SODIUM CHLORIDE 0.9 % IV SOLN
250.0000 mL | INTRAVENOUS | Status: DC | PRN
  Administered 2017-11-27: 250 mL via INTRAVENOUS

## 2017-11-25 MED ORDER — FENTANYL BOLUS VIA INFUSION
25.0000 ug | INTRAVENOUS | Status: DC | PRN
  Administered 2017-11-25 – 2017-11-26 (×3): 25 ug via INTRAVENOUS
  Filled 2017-11-25: qty 25

## 2017-11-25 MED ORDER — ACETAMINOPHEN 325 MG PO TABS
650.0000 mg | ORAL_TABLET | ORAL | Status: DC | PRN
  Administered 2017-11-29: 650 mg via ORAL
  Filled 2017-11-25: qty 2

## 2017-11-25 MED ORDER — ONDANSETRON HCL 4 MG/2ML IJ SOLN: 4.0000 mg | Freq: Four times a day (QID) | INTRAMUSCULAR | Status: DC | PRN

## 2017-11-25 MED ORDER — SODIUM CHLORIDE 0.9 % IV SOLN
1.0000 g | Freq: Once | INTRAVENOUS | Status: AC
  Administered 2017-11-25: 1 g via INTRAVENOUS
  Filled 2017-11-25: qty 10

## 2017-11-25 MED ORDER — MIDAZOLAM HCL 2 MG/2ML IJ SOLN
INTRAMUSCULAR | Status: AC
  Filled 2017-11-25: qty 2

## 2017-11-25 MED ORDER — LIDOCAINE HCL (PF) 1 % IJ SOLN
INTRAMUSCULAR | Status: AC
  Filled 2017-11-25: qty 30

## 2017-11-25 MED ORDER — ROCURONIUM BROMIDE 50 MG/5ML IV SOLN
INTRAVENOUS | Status: AC | PRN
  Administered 2017-11-25: 50 mg via INTRAVENOUS

## 2017-11-25 MED ORDER — ASPIRIN 81 MG PO CHEW: 324.0000 mg | CHEWABLE_TABLET | ORAL | Status: AC

## 2017-11-25 MED ORDER — FENTANYL CITRATE (PF) 100 MCG/2ML IJ SOLN
INTRAMUSCULAR | Status: AC | PRN
  Administered 2017-11-25 (×2): 50 ug via INTRAVENOUS

## 2017-11-25 MED ORDER — IOHEXOL 350 MG/ML SOLN
INTRAVENOUS | Status: DC | PRN
  Administered 2017-11-25: 115 mL

## 2017-11-25 MED ORDER — HEPARIN (PORCINE) IN NACL 1000-0.9 UT/500ML-% IV SOLN
INTRAVENOUS | Status: DC | PRN
  Administered 2017-11-25 (×2): 500 mL

## 2017-11-25 MED ORDER — HEPARIN (PORCINE) IN NACL 100-0.45 UNIT/ML-% IJ SOLN
1650.0000 [IU]/h | INTRAMUSCULAR | Status: DC
  Administered 2017-11-26: 1100 [IU]/h via INTRAVENOUS
  Administered 2017-11-26: 1400 [IU]/h via INTRAVENOUS
  Filled 2017-11-25 (×2): qty 250

## 2017-11-25 MED ORDER — ETOMIDATE 2 MG/ML IV SOLN
INTRAVENOUS | Status: AC | PRN
  Administered 2017-11-25: 20 mg via INTRAVENOUS

## 2017-11-25 MED ORDER — ASPIRIN EC 81 MG PO TBEC: 81.0000 mg | DELAYED_RELEASE_TABLET | Freq: Every day | ORAL | Status: DC

## 2017-11-25 MED ORDER — FAMOTIDINE IN NACL 20-0.9 MG/50ML-% IV SOLN
20.0000 mg | Freq: Every day | INTRAVENOUS | Status: DC
  Administered 2017-11-25 – 2017-11-27 (×3): 20 mg via INTRAVENOUS
  Filled 2017-11-25 (×4): qty 50

## 2017-11-25 MED ORDER — LIDOCAINE HCL (PF) 1 % IJ SOLN
INTRAMUSCULAR | Status: DC | PRN
  Administered 2017-11-25: 16 mL

## 2017-11-25 MED ORDER — CHLORHEXIDINE GLUCONATE CLOTH 2 % EX PADS
6.0000 | MEDICATED_PAD | Freq: Every day | CUTANEOUS | Status: DC
  Administered 2017-11-26 – 2017-11-28 (×2): 6 via TOPICAL

## 2017-11-25 MED ORDER — FENTANYL 2500MCG IN NS 250ML (10MCG/ML) PREMIX INFUSION
25.0000 ug/h | INTRAVENOUS | Status: DC
  Administered 2017-11-25: 25 ug/h via INTRAVENOUS
  Administered 2017-11-25: 50 ug/h via INTRAVENOUS

## 2017-11-25 MED ORDER — CHLORHEXIDINE GLUCONATE 0.12% ORAL RINSE (MEDLINE KIT)
15.0000 mL | Freq: Two times a day (BID) | OROMUCOSAL | Status: DC
  Administered 2017-11-25 – 2017-11-26 (×2): 15 mL via OROMUCOSAL

## 2017-11-25 MED ORDER — FENTANYL CITRATE (PF) 100 MCG/2ML IJ SOLN
50.0000 ug | Freq: Once | INTRAMUSCULAR | Status: AC
  Administered 2017-11-25: 50 ug via INTRAVENOUS

## 2017-11-25 MED ORDER — SODIUM CHLORIDE 0.9 % IV SOLN
INTRAVENOUS | Status: DC
  Administered 2017-11-25: 13:00:00 via INTRAVENOUS

## 2017-11-25 MED ORDER — HEPARIN (PORCINE) IN NACL 100-0.45 UNIT/ML-% IJ SOLN
1100.0000 [IU]/h | INTRAMUSCULAR | Status: DC
  Filled 2017-11-25: qty 250

## 2017-11-25 MED ORDER — HEPARIN (PORCINE) IN NACL 1000-0.9 UT/500ML-% IV SOLN
INTRAVENOUS | Status: AC
  Filled 2017-11-25: qty 1000

## 2017-11-25 MED ORDER — SODIUM CHLORIDE 0.9% FLUSH
3.0000 mL | Freq: Two times a day (BID) | INTRAVENOUS | Status: DC
  Administered 2017-11-25 – 2017-11-30 (×10): 3 mL via INTRAVENOUS

## 2017-11-25 MED ORDER — PROPOFOL 1000 MG/100ML IV EMUL
0.0000 ug/kg/min | INTRAVENOUS | Status: DC
  Filled 2017-11-25: qty 100

## 2017-11-25 SURGICAL SUPPLY — 19 items
CATH EXPO 5F MPA-1 (CATHETERS) ×1 IMPLANT
CATH INFINITI 5 FR 3DRC (CATHETERS) ×1 IMPLANT
CATH INFINITI 5 FR AR1 MOD (CATHETERS) ×1 IMPLANT
CATH INFINITI 5FR AL1 (CATHETERS) ×1 IMPLANT
CATH INFINITI 5FR MULTPACK ANG (CATHETERS) ×1 IMPLANT
CATH LAUNCHER 5F JL3 (CATHETERS) IMPLANT
CATH LAUNCHER 6FR JR4 (CATHETERS) ×1 IMPLANT
CATHETER LAUNCHER 5F JL3 (CATHETERS) ×2
ELECT DEFIB PAD ADLT CADENCE (PAD) ×1 IMPLANT
KIT HEART LEFT (KITS) ×2 IMPLANT
PACK CARDIAC CATHETERIZATION (CUSTOM PROCEDURE TRAY) ×2 IMPLANT
PINNACLE LONG 6F 25CM (SHEATH) ×2
SHEATH INTRO PINNACLE 6F 25CM (SHEATH) IMPLANT
SHEATH PINNACLE 6F 10CM (SHEATH) ×1 IMPLANT
SHEATH PROBE COVER 6X72 (BAG) ×1 IMPLANT
TRANSDUCER W/STOPCOCK (MISCELLANEOUS) ×2 IMPLANT
TUBING CIL FLEX 10 FLL-RA (TUBING) ×2 IMPLANT
WIRE EMERALD 3MM-J .035X150CM (WIRE) ×1 IMPLANT
WIRE EMERALD ST .035X150CM (WIRE) ×1 IMPLANT

## 2017-11-25 NOTE — ED Triage Notes (Signed)
Pt endorses generalized weakness and general malaise 2-3 days. Did not start dialysis this morning, last full treatment was Friday. VS 150/92, HR 106, CBG 204, 98% on RA

## 2017-11-25 NOTE — H&P (Addendum)
Cardiology History and Physical:   Patient ID: Jose Garrett MRN: [id number]; DOB: 15-Jun-1950  Admit date: 11/25/2017 Date of Consult: 11/25/2017  Primary Care Provider: [name], MD Primary Cardiologist: Lauree Chandler, MD  Primary Electrophysiologist:  None    Patient Profile:   Jose Garrett is a 67 y.o. male with a hx of amyloidosis (Followed at [facility]), ESRD on HD (Dr. Detterding), HTN, HLD, GERD, and chronic diastolic heart failure who is being seen today for the evaluation of elevated troponin.  History of Present Illness:   Jose Garrett does not currently follow with cardiology. He had an echo 10/15/17 - LVEF 50-55%, moderate AS, and no wall motion abnormality. She has impaired diastolic dysfunction, but could not quantify. He presented to [facility] with ongoing fatigue since Saturday. His last HD session was on Friday (MWF HD). He was unable to start HD this morning because he was told he was at his dry weight, which is unusual for him.   On my interview, he states he has been excessively tired and slept frequently Sat and Sunday. He reported to HD and they asked him if he wanted to be seen in the ER and he did. On arrival, he was hemodynamically stable. First POC troponin over 13. However, he became acutely short of breath requiring BiPAP due to increased work of breathing and hypoxia. He also developed hypertensive urgency with systolic pressure greater than 200, started on nitro drip. Pressure now systolic 076A. He denies chest pain, but states he can't say for sure.  EDP did not start heparin due to some mild hemoptysis with cough prior to BiPAP.   Past Medical History:  Diagnosis Date  . Amyloidosis ([place])   . Anemia Dx 2016  . Chronic diastolic CHF (congestive heart failure), NYHA class 1 ([place])    /hospital problem list 09/08/2014  . Chronic renal disease, stage III ([place])    /hospital problem list 09/08/2014  . Diarrhea   . Dyspnea    with exertion  . Enlarged heart  2015  . Family history of adverse reaction to anesthesia    "my daughter can't take certain anesthesia agents" (09/08/2014)  . GERD (gastroesophageal reflux disease)   . History of blood transfusion 09/08/2014   "got hematoma after renal biopsy & HgB dropped"  . Hyperlipidemia Dx 2012  . Hypertension Dx 2012  . Renal disorder    "there is a spot on my kidney" (09/08/2014)  . Restless legs     Past Surgical History:  Procedure Laterality Date  . AV FISTULA PLACEMENT Right 12/27/2015   Procedure: Right Arm ARTERIOVENOUS (AV) FISTULA CREATION;  Surgeon: Angelia Mould, MD;  Location: [place];  Service: Vascular;  Laterality: Right;  . COLONOSCOPY    . IR GENERIC HISTORICAL  01/27/2016   IR FLUORO GUIDE CV LINE RIGHT 01/27/2016 Arne Cleveland, MD [facility]-INTERV RAD  . IR GENERIC HISTORICAL  01/27/2016   IR US GUIDE VASC ACCESS RIGHT 01/27/2016 Arne Cleveland, MD [facility]INTER[facility]  . LAPAROSCOPIC CHOLECYSTECTOMY  03/2011  . RENAL BIOPSY, PERCUTANEOUS Right 09/08/2014  . TONSILLECTOMY  ~ 1960     Home Medications:  Prior to Admission medications   Medication Sig Start Date End Date Taking? Authorizing Provider  acetaminophen (TYLENOL) 325 MG tablet Take 325-650 mg by mouth every 6 (six) hours as needed (back pain).   Yes [provider]  amitriptyline (ELAVIL) 50 MG tablet Take 1 tablet (50 mg total) by mouth at bedtime. 07/25/17  Yes [name],  MD  aspirin EC 81 MG tablet Take 1 tablet (81 mg total) by mouth daily. 01/14/15  Yes [name], MD  B Complex-C-Folic Acid (DIALYVITE 431) 0.8 MG TABS Take 0.8 mg by mouth daily.   Yes [provider]  BESIVANCE 0.6 % SUSP Place 1 drop into both eyes 3 (three) times daily. 11/01/17  Yes [provider]  cetirizine (ZYRTEC) 10 MG tablet Take 10 mg by mouth daily as needed for allergies.    Yes [provider]  Cholecalciferol (VITAMIN D3) 3000 units TABS Take 3,000 Units by mouth 3 (three) times a  week.    Yes [provider]  cyanocobalamin 2000 MCG tablet Take 2,500 mcg by mouth daily.   Yes [provider]  DUREZOL 0.05 % EMUL Place 1 drop into both eyes 3 (three) times daily. 11/01/17  Yes [provider]  gabapentin (NEURONTIN) 300 MG capsule 300mg  po qAM and 600mg  po qHS Patient taking differently: Take 300-600 mg by mouth See admin instructions. 300mg  po qAM and 600mg  po qHS 05/14/17  Yes [name], MD  LORazepam (ATIVAN) 1 MG tablet Take 1 tablet (1 mg total) by mouth daily as needed for anxiety (nausea). 01/08/17  Yes [name], MD  oxyCODONE (OXY IR/ROXICODONE) 5 MG immediate release tablet Take 1-2 tablets (5-10 mg total) by mouth every 6 (six) hours as needed for severe pain. 10/02/17  Yes [name], MD  oxyCODONE (OXYCONTIN) 10 mg 12 hr tablet Take 1 tablet (10 mg total) by mouth at bedtime. 07/25/17  Yes [name], MD  VELPHORO 500 MG chewable tablet Chew 500-1,500 mg by mouth See admin instructions. Take 500mg  to 1500mg  depending on the meal size 12/28/16  Yes [provider]    Inpatient Medications: Scheduled Meds:  Continuous Infusions: . sodium chloride 75 mL/hr at 11/25/17 1254  . nitroGLYCERIN 10 mcg/min (11/25/17 1245)   PRN Meds:   Allergies:    Allergies  Allergen Reactions  . No Known Allergies     Social History:   Social History   Socioeconomic History  . Marital status: Divorced    Spouse name: Not on file  . Number of children: Not on file  . Years of education: Not on file  . Highest education level: Not on file  Occupational History  . Not on file  Social Needs  . Financial resource strain: Not on file  . Food insecurity:    Worry: Not on file    Inability: Not on file  . Transportation needs:    Medical: Not on file    Non-medical: Not on file  Tobacco Use  . Smoking status: Former Smoker    Packs/day: 2.00    Years: 40.00    Pack years: 80.00    Types:  Cigarettes    Last attempt to quit: 03/19/2011    Years since quitting: 6.6  . Smokeless tobacco: Never Used  Substance and Sexual Activity  . Alcohol use: No    Alcohol/week: 0.0 standard drinks  . Drug use: No    Comment: 09/08/2014 "I have used marijuana till the 1990's". Notes that he quit 15 yrs ago  . Sexual activity: Not Currently  Lifestyle  . Physical activity:    Days per week: Not on file    Minutes per session: Not on file  . Stress: Not on file  Relationships  . Social connections:    Talks on phone: Not on file  Gets together: Not on file    Attends religious service: Not on file    Active member of club or organization: Not on file    Attends meetings of clubs or organizations: Not on file    Relationship status: Not on file  . Intimate partner violence:    Fear of current or ex partner: Not on file    Emotionally abused: Not on file    Physically abused: Not on file    Forced sexual activity: Not on file  Other Topics Concern  . 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     Family History:    Family History  Problem Relation Age of Onset  . Hypertension Mother   . Diabetes Mother   . Heart disease Mother   . Skin cancer Mother   . Hypertension Father   . Diabetes Father   . Diabetes Sister      ROS:  Please see the history of present illness.   All other ROS reviewed and negative.     Physical Exam/Data:   Vitals:   11/25/17 1155 11/25/17 1200 11/25/17 1215 11/25/17 1230  BP: (!) 177/102 (!) 177/94 (!) 165/93 (!) 150/82  Pulse: (!) 125 (!) 124 (!) 120 (!) 115  Resp: (!) 27 (!) 34 (!) 32 (!) 31  Temp:      TempSrc:      SpO2: 98% 95% 95% 97%   No intake or output data in the 24 hours ending 11/25/17 1255 There were no vitals filed for this visit. There is no height or weight on file to calculate BMI.   General:  Respiratory distress HEENT: normal Neck: no JVD Vascular: No carotid bruits Cardiac:  Tachycardic rate, + murmur on exam Lungs:  On BiPAP Abd: soft, nontender, no hepatomegaly  Ext: trace edema, Right AV fistula Musculoskeletal:  No deformities, BUE and BLE strength normal and equal Skin: warm and dry  Neuro:  CNs 2-12 intact, no focal abnormalities noted Psych:  Normal affect   EKG:  The EKG was personally reviewed and demonstrates:  Tachycardic, ST depression inferior leads and repolarization abnormality, RBBB Telemetry:  Telemetry was personally reviewed and demonstrates:  Tachycardic rate in the 110-120s  Relevant CV Studies:  Echo 10/13/17: Study Conclusions - Left ventricle: The cavity size was normal. There was moderate   concentric hypertrophy. Systolic function was normal. The   estimated ejection fraction was in the range of 50% to 55%. Wall   motion was normal; there were no regional wall motion   abnormalities. - Aortic valve: Trileaflet; moderately thickened, severely   calcified leaflets. There was moderate stenosis. There was   moderate regurgitation. Mean gradient (S): 37 mm Hg. Peak   gradient (S): 57 mm Hg. Valve area (VTI): 1.28 cm^2. Valve area   (Vmax): 1.17 cm^2. Valve area (Vmean): 1.06 cm^2. Regurgitation   pressure half-time: 481 ms. - Mitral valve: Calcified annulus. Mildly thickened leaflets .   There was trivial regurgitation. - Left atrium: The atrium was mildly dilated. - Right ventricle: Systolic function was normal. - Atrial septum: No defect or patent foramen ovale was identified. - Tricuspid valve: There was trivial regurgitation. - Pulmonic valve: There was trivial regurgitation.  Impressions: - Preserved LV systolic function, impaired relaxation but   indeterminate for quantitation of diastolic dysfunction. Aortic  stenosis is now moderate with moderate aortic regurgitation as   well. Right sided pressures cannot be assessed  due to lack of TR   jet.  Laboratory Data:  Chemistry Recent Labs  Lab 11/25/17 1040 11/25/17 1059  NA 140 136  K 4.0 3.8  CL 94* 96*  CO2 27  --   GLUCOSE 126* 128*  BUN 50* 47*  CREATININE 11.99* 12.10*  CALCIUM 9.8  --   GFRNONAA 4*  --   GFRAA 4*  --   ANIONGAP 19*  --     Recent Labs  Lab 11/25/17 1040  PROT 7.2  ALBUMIN 3.9  AST 49*  ALT 20  ALKPHOS 55  BILITOT 1.5*   Hematology Recent Labs  Lab 11/25/17 1040 11/25/17 1059  WBC 9.3  --   RBC 3.68*  --   HGB 11.7* 12.9*  HCT 37.3* 38.0*  MCV 101.4*  --   MCH 31.8  --   MCHC 31.4  --   RDW 15.6*  --   PLT PLATELET CLUMPS NOTED ON SMEAR, COUNT APPEARS DECREASED  --    Cardiac EnzymesNo results for input(s): TROPONINI in the last 168 hours.  Recent Labs  Lab 11/25/17 1056 11/25/17 1236  TROPIPOC 13.34* 12.79*    BNPNo results for input(s): BNP, PROBNP in the last 168 hours.  DDimer No results for input(s): DDIMER in the last 168 hours.  Radiology/Studies:  Dg Chest 2 View  Result Date: 11/25/2017 CLINICAL DATA:  Productive cough. EXAM: CHEST - 2 VIEW COMPARISON:  10/12/2017 FINDINGS: There is new mild cardiomegaly with small bilateral pleural effusions and diffuse hazy bilateral infiltrates consistent with pulmonary edema. Pulmonary vascularity is within normal limits. No bone abnormality. IMPRESSION: New bilateral pulmonary edema, new mild cardiomegaly, and new small bilateral pleural effusions, all consistent with congestive heart failure. Electronically Signed   By: [name] M.D.   On: 11/25/2017 09:46    Assessment and Plan:   1. Elevated troponin, no history of CAD - troponin 13.34, repeat pending - EKG with ST depression inferior leads, repolarization abnormality, RBBB (old) - pt denies chest pain, but had significant fatigue and low PO intake all weekend - will hold off on fluids for now - will start heparin drip - plan to consult PCCM for intubation with plans for heart cath this  afternoon after stable on vent   2. ESRD on HD - pt compliant, but was told he was at his dry weight today - he has poor PO intake over the weekend - lower extremity swelling present, CXR with edema - recommend nephrology consult   3. Chronic diastolic heart failure - will repeat echo after heart cath  - hold off on lasix, will defer to nephrology   4. Moderate aortic stenosis - murmur on exam - likely contributing to shortness of breath      For questions or updates, please contact [place] Please consult [email or web address] for contact info under   Signed, Ledora Bottcher, PA  11/25/2017 12:55 PM  I have personally seen and examined this patient with Fabian Sharp, PA. I agree with the assessment and plan as outlined above. Jose Garrett is a 67 yo male with history of aortic stenosis, ESRD on HD, amyloidosis, HTN, GERD and chronic diastolic CHF who presents to the ED with c/o dyspnea and feeling poorly overall. He presented to his HD unit this am and was told he was at dry weight and HD was not started. Upon arrival in the  ED, he became acutely dyspneic and required bipap. Troponin 13. EKG is personally reviewed by me and shows sinus tachycardia with diffuse ST depression.  I am seeing him now and he has come off of bipap but is still dyspneic. He cannot lay flat on the bed.  Labs reviewed by me.  He also has moderately severe aortic stenosis by echo 10/15/17.   My exam:   General: Well developed, well nourished. He appears dyspneic.  HEENT: OP clear, mucus membranes moist  SKIN: warm, dry. No rashes. Neuro: No focal deficits  Musculoskeletal: Muscle strength 5/5 all ext  Psychiatric: Mood and affect normal  Neck: No JVD, no carotid bruits, no thyromegaly, no lymphadenopathy.  Lungs:scattered crackles both lungs.  Cardiovascular: Tachy. Loud systolic murmur.  Abdomen:Soft. Bowel sounds present. Non-tender.  Extremities: 1+ bilateral lower extremity edema.   Plan: He is  being admitted with respiratory distress in setting of weakness, elevated troponin and abnormal EKG. He is volume overloaded but is ESRD on HD so we cannot diurese him in the ED. Will ask PCCM to intubate for airway protection. Will plan urgent cardiac cath this afternoon. We will start heparin. He will need HD post cath. He also has moderately severe AS. Given his chronic issues would probably have to consider TAVR but if he has diffuse 3 vessel CAD, may need AVR and CABG.   Lauree Chandler 11/25/2017 1:15 PM

## 2017-11-25 NOTE — ED Notes (Signed)
ED Provider at bedside. 

## 2017-11-25 NOTE — Procedures (Signed)
OGT insertion by MD, placed under direct laryngoscopy and verified by auscultation.  Rush Farmer, M.D. [facility] Pulmonary/Critical Care Medicine. Pager: [phone]. After hours pager: [phone].

## 2017-11-25 NOTE — Procedures (Signed)
Intubation Procedure Note FALCON MCCASKEY [id number] 06/16/1950  Procedure: Intubation Indications: Respiratory insufficiency  Procedure Details Consent: Risks of procedure as well as the alternatives and risks of each were explained to the (patient/caregiver).  Consent for procedure obtained. Time Out: Verified patient identification, verified procedure, site/side was marked, verified correct patient position, special equipment/implants available, medications/allergies/relevent history reviewed, required imaging and test results available.  Performed  Maximum sterile technique was used including gloves, hand hygiene and mask.  MAC    Evaluation Hemodynamic Status: BP stable throughout; O2 sats: stable throughout Patient's Current Condition: stable Complications: No apparent complications Patient did tolerate procedure well. Chest X-ray ordered to verify placement.  CXR: pending.   Jennet Maduro 11/25/2017

## 2017-11-25 NOTE — Interval H&P Note (Signed)
History and Physical Interval Note:  11/25/2017 1:35 PM  Jose Garrett  has presented today for surgery, with the diagnosis of cp  The various methods of treatment have been discussed with the patient and family. After consideration of risks, benefits and other options for treatment, the patient has consented to  Procedure(s): LEFT HEART CATH AND CORONARY ANGIOGRAPHY (N/A) as a surgical intervention .  The patient's history has been reviewed, patient examined, no change in status, stable for surgery.  I have reviewed the patient's chart and labs.  Questions were answered to the patient's satisfaction.   Cath Lab Visit (complete for each Cath Lab visit)  Clinical Evaluation Leading to the Procedure:   ACS: Yes.    Non-ACS:    Anginal Classification: CCS IV  Anti-ischemic medical therapy: No Therapy  Non-Invasive Test Results: No non-invasive testing performed  Prior CABG: No previous CABG        [name] [facility] 11/25/2017 1:35 PM

## 2017-11-25 NOTE — ED Notes (Signed)
Pt placed on Bipap by respiratory 

## 2017-11-25 NOTE — Progress Notes (Addendum)
[place] for heparin Indication: chest pain/ACS  Allergies  Allergen Reactions  . No Known Allergies     Patient Measurements: Weight: 203 lb 14.8 oz (92.5 kg) Heparin Dosing Weight: 91kg  Vital Signs: Temp: 98.3 F (36.8 C) (09/16 0921) Temp Source: Oral (09/16 0921) BP: 182/92 (09/16 1325) Pulse Rate: 129 (09/16 1325)  Labs: Recent Labs    11/25/17 1040 11/25/17 1059 11/25/17 1306  HGB 11.7* 12.9*  --   HCT 37.3* 38.0*  --   PLT PLATELET CLUMPS NOTED ON SMEAR, COUNT APPEARS DECREASED  --   --   CREATININE 11.99* 12.10*  --   TROPONINI  --   --  16.18*    Estimated Creatinine Clearance: 7 mL/min (A) (by C-G formula based on SCr of 12.1 mg/dL (H)).   Medical History: Past Medical History:  Diagnosis Date  . Amyloidosis ([place])   . Anemia Dx 2016  . Chronic diastolic CHF (congestive heart failure), NYHA class 1 ([place])    /hospital problem list 09/08/2014  . Chronic renal disease, stage III ([place])    /hospital problem list 09/08/2014  . Diarrhea   . Dyspnea    with exertion  . Enlarged heart 2015  . Family history of adverse reaction to anesthesia    "my daughter can't take certain anesthesia agents" (09/08/2014)  . GERD (gastroesophageal reflux disease)   . History of blood transfusion 09/08/2014   "got hematoma after renal biopsy & HgB dropped"  . Hyperlipidemia Dx 2012  . Hypertension Dx 2012  . Renal disorder    "there is a spot on my kidney" (09/08/2014)  . Restless legs      Assessment: 67 yo male s/p cath to start heparin 8 hrs post sheath removal (femoral approach; removed ~ 3:45pm ).   Goal of Therapy:  Heparin level 0.3-0.7 units/ml Monitor platelets by anticoagulation protocol: Yes   Plan:  -Start heparin at 1100 units/hr 8 hours post sheath removal (will start at midnight) -Heparin level in 8 hours and daily wth CBC daily  Hildred Laser, PharmD Clinical Pharmacist Please check Amion for pharmacy contact  number

## 2017-11-25 NOTE — ED Notes (Signed)
This RN attempted IV start twice with no success.   IV tem consult placed.

## 2017-11-25 NOTE — Progress Notes (Signed)
  Patient has a beard causing a leak from BIPAP mask.

## 2017-11-25 NOTE — ED Notes (Signed)
EDP at bedside  

## 2017-11-25 NOTE — ED Notes (Signed)
Cardiology at bedside.

## 2017-11-25 NOTE — Consult Note (Addendum)
Jose Garrett  BJS:[id number] DOB: 06/22/1950 DOA: 11/25/2017 PCP: [name], MD    LOS: 0 days   Reason for Consult / Chief Complaint:  SOB  Consulting MD and date:  Beverlee Nims 11/25/17  HPI/Summary of hospital stay:   [name] is a 67 y.o. male who has PMH including but not limited to ESRD on HD MWF (last session Fri 9/13), amyloidosis (followed at [facility]), chronic diastolic heart failure, moderate AS.  He presented to [facility] 11/25/17 with fatigue and dyspnea.  In ED, he was found to have troponin of 13, ST depression in inferior leads, and required BiPAP due to increased WOB.  He was unable to lie flat due to worsening dyspnea.  He was seen by cardiology who plans to take him to cath lab emergently.  To facilitate cath, PCCM consultation was requested as it was felt that pt might benefit from intubation.   Subjective:  Has increased WOB, unable to lie flat.  Denies chest pain.  Objective   Blood pressure (!) 162/89, pulse (!) 122, temperature 98.3 F (36.8 C), temperature source Oral, resp. rate (!) 31, SpO2 93 %.    FiO2 (%):  [40 %] 40 %  No intake or output data in the 24 hours ending 11/25/17 1304 There were no vitals filed for this visit.  Examination: General: Adult male, resting in bed, some increased WOB. Neuro: A&O x 3, no deficits. HEENT: [place]/AT. Sclerae anicteric, EOMI. Cardiovascular: RRR, 3/6 SEM.  Lungs: Respirations even and unlabored.  CTA bilaterally, No W/R/R.  Abdomen: BS x 4, soft, NT/ND.  Musculoskeletal: No gross deformities, 1+ edema.  Skin: Intact, warm, no rashes.  Consults: date of consult/date signed off & final recs:  PCCM 9/16 >   Procedures: ETT 9/16 >   Significant Diagnostic Tests: Cath lab 9/16 >  Echo 9/17 >   Micro Data: None.  Antimicrobials:  None.   Resolved Hospital Problem list    Assessment & Plan:   NSTEMI. - Cardiology taking to cath lab emergently now. - Post cath lab care per cards. - Heparin per  cards.  Hx AoC combined heart failure (echo from Aug 2019 with EF 50 - 55%, indeterminate quantification of diastolic dysfunction), HTN, HLD, amyloidosis (followed at [facility]). - f/u on echo post cath.  Acute hypoxic respiratory failure - due to above + AS. - Intubate now.  ESRD on HD MWF (last session Fri 11/22/17). - Nephrology consulted by cardiology.  Hypocalcemia. - 1g Ca gluconate.  Sedation needs due to mechanical ventilation. Sedation: Propofol gtt / Fentanyl PRN. RASS goal: 0 to -1. Daily WUA.  Disposition / Summary of Today's Plan 11/25/17   To cath lab emergently then admit to ICU under cardiology. Possible extubation in AM 11/26/17.  Best Practice / Goals of Care / Disposition.   DVT prophylaxis: heparin gtt. GI prophylaxis: famotidine. Diet: NPO. Mobility: Bedrest. Code Status: Full. Family Communication: None available.   Labs   CBC: Recent Labs  Lab 11/25/17 1040 11/25/17 1059  WBC 9.3  --   NEUTROABS 7.6  --   HGB 11.7* 12.9*  HCT 37.3* 38.0*  MCV 101.4*  --   PLT PLATELET CLUMPS NOTED ON SMEAR, COUNT APPEARS DECREASED  --    Basic Metabolic Panel: Recent Labs  Lab 11/25/17 1040 11/25/17 1059  NA 140 136  K 4.0 3.8  CL 94* 96*  CO2 27  --   GLUCOSE 126* 128*  BUN 50* 47*  CREATININE 11.99* 12.10*  CALCIUM  9.8  --    GFR: CrCl cannot be calculated (Unknown ideal weight.). Recent Labs  Lab 11/25/17 1040 11/25/17 1058  WBC 9.3  --   LATICACIDVEN  --  1.81   Liver Function Tests: Recent Labs  Lab 11/25/17 1040  AST 49*  ALT 20  ALKPHOS 55  BILITOT 1.5*  PROT 7.2  ALBUMIN 3.9   No results for input(s): LIPASE, AMYLASE in the last 168 hours. No results for input(s): AMMONIA in the last 168 hours. ABG    Component Value Date/Time   PHART 7.436 11/25/2017 1206   PCO2ART 38.5 11/25/2017 1206   PO2ART 97.0 11/25/2017 1206   HCO3 25.9 11/25/2017 1206   TCO2 27 11/25/2017 1206   O2SAT 98.0 11/25/2017 1206    Coagulation  Profile: No results for input(s): INR, PROTIME in the last 168 hours. Cardiac Enzymes: No results for input(s): CKTOTAL, CKMB, CKMBINDEX, TROPONINI in the last 168 hours. HbA1C: Hgb A1c MFr Bld  Date/Time Value Ref Range Status  01/23/2016 11:05 PM 5.7 (H) 4.8 - 5.6 % Final    Comment:    (NOTE)         Pre-diabetes: 5.7 - 6.4         Diabetes: >6.4         Glycemic control for adults with diabetes: <7.0    CBG: No results for input(s): GLUCAP in the last 168 hours.   Review of Systems:   Unable to obtain as pt is encephalopathic.  Past medical history  He,  has a past medical history of Amyloidosis ([place]), Anemia (Dx 2016), Chronic diastolic CHF (congestive heart failure), NYHA class 1 ([place]), Chronic renal disease, stage III ([place]), Diarrhea, Dyspnea, Enlarged heart (2015), Family history of adverse reaction to anesthesia, GERD (gastroesophageal reflux disease), History of blood transfusion (09/08/2014), Hyperlipidemia (Dx 2012), Hypertension (Dx 2012), Renal disorder, and Restless legs.   Surgical History    Past Surgical History:  Procedure Laterality Date  . AV FISTULA PLACEMENT Right 12/27/2015   Procedure: Right Arm ARTERIOVENOUS (AV) FISTULA CREATION;  Surgeon: Angelia Mould, MD;  Location: [place];  Service: Vascular;  Laterality: Right;  . COLONOSCOPY    . IR GENERIC HISTORICAL  01/27/2016   IR FLUORO GUIDE CV LINE RIGHT 01/27/2016 Arne Cleveland, MD [facility]INTER[facility]  . IR GENERIC HISTORICAL  01/27/2016   IR US GUIDE VASC ACCESS RIGHT 01/27/2016 Arne Cleveland, MD [facility]  . LAPAROSCOPIC CHOLECYSTECTOMY  03/2011  . RENAL BIOPSY, PERCUTANEOUS Right 09/08/2014  . TONSILLECTOMY  ~ 17     Social History   Social History   Socioeconomic History  . Marital status: Divorced    Spouse name: Not on file  . Number of children: Not on file  . Years of education: Not on file  . Highest education level: Not on file  Occupational History  . Not on file  Social  Needs  . Financial resource strain: Not on file  . Food insecurity:    Worry: Not on file    Inability: Not on file  . Transportation needs:    Medical: Not on file    Non-medical: Not on file  Tobacco Use  . Smoking status: Former Smoker    Packs/day: 2.00    Years: 40.00    Pack years: 80.00    Types: Cigarettes    Last attempt to quit: 03/19/2011    Years since quitting: 6.6  . Smokeless tobacco: Never Used  Substance and Sexual Activity  . Alcohol  use: No    Alcohol/week: 0.0 standard drinks  . Drug use: No    Comment: 09/08/2014 "I have used marijuana till the 1990's". Notes that he quit 15 yrs ago  . Sexual activity: Not Currently  Lifestyle  . Physical activity:    Days per week: Not on file    Minutes per session: Not on file  . Stress: Not on file  Relationships  . Social connections:    Talks on phone: Not on file    Gets together: Not on file    Attends religious service: Not on file    Active member of club or organization: Not on file    Attends meetings of clubs or organizations: Not on file    Relationship status: Not on file  . Intimate partner violence:    Fear of current or ex partner: Not on file    Emotionally abused: Not on file    Physically abused: Not on file    Forced sexual activity: Not on file  Other Topics Concern  . 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   ,  reports that he quit smoking about 6 years ago. His smoking use included cigarettes. He has a 80.00 pack-year smoking history. He has never used smokeless tobacco. He reports that he does not drink alcohol or use drugs.   Family history   His family history includes Diabetes in his father, mother, and sister; Heart disease in his mother; Hypertension in his father and mother; Skin cancer in his mother.   Allergies Allergies  Allergen  Reactions  . No Known Allergies     Home meds  Prior to Admission medications   Medication Sig Start Date End Date Taking? Authorizing Provider  acetaminophen (TYLENOL) 325 MG tablet Take 325-650 mg by mouth every 6 (six) hours as needed (back pain).   Yes [provider]  amitriptyline (ELAVIL) 50 MG tablet Take 1 tablet (50 mg total) by mouth at bedtime. 07/25/17  Yes [name], MD  aspirin EC 81 MG tablet Take 1 tablet (81 mg total) by mouth daily. 01/14/15  Yes [name], MD  B Complex-C-Folic Acid (DIALYVITE 235) 0.8 MG TABS Take 0.8 mg by mouth daily.   Yes [provider]  BESIVANCE 0.6 % SUSP Place 1 drop into both eyes 3 (three) times daily. 11/01/17  Yes [provider]  cetirizine (ZYRTEC) 10 MG tablet Take 10 mg by mouth daily as needed for allergies.    Yes [provider]  Cholecalciferol (VITAMIN D3) 3000 units TABS Take 3,000 Units by mouth 3 (three) times a week.    Yes [provider]  cyanocobalamin 2000 MCG tablet Take 2,500 mcg by mouth daily.   Yes [provider]  DUREZOL 0.05 % EMUL Place 1 drop into both eyes 3 (three) times daily. 11/01/17  Yes [provider]  gabapentin (NEURONTIN) 300 MG capsule 300mg  po qAM and 600mg  po qHS Patient taking differently: Take 300-600 mg by mouth See admin instructions. 300mg  po qAM and 600mg  po qHS 05/14/17  Yes [name], MD  LORazepam (ATIVAN) 1 MG tablet Take 1 tablet (1 mg total) by mouth daily as needed for anxiety (nausea). 01/08/17  Yes [name], MD  oxyCODONE (OXY IR/ROXICODONE) 5 MG immediate release tablet Take  1-2 tablets (5-10 mg total) by mouth every 6 (six) hours as needed for severe pain. 10/02/17  Yes [name], MD  oxyCODONE (OXYCONTIN) 10 mg 12 hr tablet Take 1 tablet (10 mg total) by mouth at bedtime. 07/25/17  Yes [name], MD  VELPHORO 500 MG chewable tablet Chew 500-1,500 mg by mouth See admin  instructions. Take 500mg  to 1500mg  depending on the meal size 12/28/16  Yes [provider]    CC time: 30 min.   Montey Hora, [facility] Pager: [phone]  or [phone] 11/25/2017, 1:04 PM  Attending Note:  67 year old male with history of amyloid and ESRD who presents with CP and SOB.  Noted to have a troponin in 15 in the ER and developed flash pulmonary edema.  Patient was in respiratory failure and unable to lay flat but needed to go to the cath lab.  PCCM was called by cardiology to evaluate the patient.  Upon evaluating the patient, the patient has severe orthopnea and was unable to lay flat.  Given active MI, BiPAP is contraindicated at this time and patient needed to be taken emergently to the cath lab.  After discussion with the patient and cardiology decision was made to proceed with intubation emergently prior to the cath lab visit.  On exam, diffuse crackles noted.  I reviewed CXR myself, pulmonary edema noted.  Patient was intubated and taken to the cath lab.  Patient was hypertensive and propofol and NTG were started.  Will maintain on full vent support.  F/U post cath with ABG and CXR.  PCCM will continue to follow.  The patient is critically ill with multiple organ systems failure and requires high complexity decision making for assessment and support, frequent evaluation and titration of therapies, application of advanced monitoring technologies and extensive interpretation of multiple databases.   Critical Care Time devoted to patient care services described in this note is  45  Minutes. This time reflects time of care of this signee Dr Jennet Maduro. This critical care time does not reflect procedure time, or teaching time or supervisory time of PA/NP/Med student/Med Resident etc but could involve care discussion time.  Rush Farmer, M.D. [facility] Pulmonary/Critical Care Medicine. Pager: [phone]. After hours pager:  [phone].

## 2017-11-25 NOTE — ED Notes (Signed)
Respiratory at bedside.

## 2017-11-25 NOTE — Consult Note (Addendum)
[facility] Renal Consultation Note    Indication for Consultation:  Management of ESRD/hemodialysis, anemia, hypertension/volume, and secondary hyperparathyroidism. PCP:  HPI: Jose Garrett is a 67 y.o. male with ESRD, HTN, amyloidosis, CHF, GERD who was admitted with NSTEMI.  Per ED notes, he presented with 2-3 days of malaise. Shortly after arriving, he became acutely SOB requiring Bi-pap. CXR + for pulm edema. Labs + for troponin 13 and EKG with new ST segment depression. Cardiology was consulted; they electively intubated him and have taken him to the cath lab urgently. Per notes, cath showed occluded RCA, filling distally through collaterals, LAD and circumflex with moderate non-obstructive disease - no areas for PCI. He is being seen now in the ICU - sedated with propofol, fentanyl. BP in low 100 range. He wakes very slightly to voice, but looks comfortable.  Dialyzes on MWF schedule at [facility]. Last HD was 9/13 which he completed in entirety via AVF. He has been meeting his EDW, but with high BP throughout dialysis.  Past Medical History:  Diagnosis Date  . Amyloidosis ([place])   . Anemia Dx 2016  . Chronic diastolic CHF (congestive heart failure), NYHA class 1 ([place])    /hospital problem list 09/08/2014  . Chronic renal disease, stage III ([place])    /hospital problem list 09/08/2014  . Diarrhea   . Dyspnea    with exertion  . Enlarged heart 2015  . Family history of adverse reaction to anesthesia    "my daughter can't take certain anesthesia agents" (09/08/2014)  . GERD (gastroesophageal reflux disease)   . History of blood transfusion 09/08/2014   "got hematoma after renal biopsy & HgB dropped"  . Hyperlipidemia Dx 2012  . Hypertension Dx 2012  . Renal disorder    "there is a spot on my kidney" (09/08/2014)  . Restless legs    Past Surgical History:  Procedure Laterality Date  . AV FISTULA PLACEMENT Right 12/27/2015   Procedure: Right Arm ARTERIOVENOUS (AV) FISTULA  CREATION;  Surgeon: Angelia Mould, MD;  Location: [place];  Service: Vascular;  Laterality: Right;  . COLONOSCOPY    . IR GENERIC HISTORICAL  01/27/2016   IR FLUORO GUIDE CV LINE RIGHT 01/27/2016 Arne Cleveland, MD [facility]  . IR GENERIC HISTORICAL  01/27/2016   IR US GUIDE VASC ACCESS RIGHT 01/27/2016 Arne Cleveland, MD [facility]  . LAPAROSCOPIC CHOLECYSTECTOMY  03/2011  . LEFT HEART CATH AND CORONARY ANGIOGRAPHY N/A 11/25/2017   Procedure: LEFT HEART CATH AND CORONARY ANGIOGRAPHY;  Surgeon: Martinique, Peter M, MD;  Location: [place] CV LAB;  Service: Cardiovascular;  Laterality: N/A;  . RENAL BIOPSY, PERCUTANEOUS Right 09/08/2014  . TONSILLECTOMY  ~ 1960   Family History  Problem Relation Age of Onset  . Hypertension Mother   . Diabetes Mother   . Heart disease Mother   . Skin cancer Mother   . Hypertension Father   . Diabetes Father   . Diabetes Sister    Social History:  reports that he quit smoking about 6 years ago. His smoking use included cigarettes. He has a 80.00 pack-year smoking history. He has never used smokeless tobacco. He reports that he does not drink alcohol or use drugs.  ROS: Unable to obtain - sedated on vent. Physical Exam: Vitals:   11/25/17 1531 11/25/17 1535 11/25/17 1545 11/25/17 1600  BP:  107/66 (!) 149/83 109/65  Pulse:  93 (!) 103 89  Resp:  '18 20 17  '$ Temp:  TempSrc:      SpO2: 92% 94% 97% 97%  Weight:         General: Well developed, sedated/ventilated. Head: Normocephalic, atraumatic. Neck: Supple without lymphadenopathy/masses.  Lungs: Clear bilaterally to auscultation without wheezes, rales, or rhonchi.  Heart: RRR; 3/6 systolic murmur present. Abdomen: Soft, non-tender, non-distended with normoactive bowel sounds.  Lower extremities: 2+ LE edema Neuro: Sedated Dialysis Access: R forearm AVF + thrill  Allergies  Allergen Reactions  . No Known Allergies    Prior to Admission medications   Medication Sig Start Date  End Date Taking? Authorizing Provider  acetaminophen (TYLENOL) 325 MG tablet Take 325-650 mg by mouth every 6 (six) hours as needed (back pain).   Yes [provider]  amitriptyline (ELAVIL) 50 MG tablet Take 1 tablet (50 mg total) by mouth at bedtime. 07/25/17  Yes [name], MD  aspirin EC 81 MG tablet Take 1 tablet (81 mg total) by mouth daily. 01/14/15  Yes [name], MD  B Complex-C-Folic Acid (DIALYVITE 347) 0.8 MG TABS Take 0.8 mg by mouth daily.   Yes [provider]  BESIVANCE 0.6 % SUSP Place 1 drop into both eyes 3 (three) times daily. 11/01/17  Yes [provider]  cetirizine (ZYRTEC) 10 MG tablet Take 10 mg by mouth daily as needed for allergies.    Yes [provider]  Cholecalciferol (VITAMIN D3) 3000 units TABS Take 3,000 Units by mouth 3 (three) times a week.    Yes [provider]  cyanocobalamin 2000 MCG tablet Take 2,500 mcg by mouth daily.   Yes [provider]  DUREZOL 0.05 % EMUL Place 1 drop into both eyes 3 (three) times daily. 11/01/17  Yes [provider]  gabapentin (NEURONTIN) 300 MG capsule '300mg'$  po qAM and '600mg'$  po qHS Patient taking differently: Take 300-600 mg by mouth See admin instructions. '300mg'$  po qAM and '600mg'$  po qHS 05/14/17  Yes [name], MD  LORazepam (ATIVAN) 1 MG tablet Take 1 tablet (1 mg total) by mouth daily as needed for anxiety (nausea). 01/08/17  Yes [name], MD  oxyCODONE (OXY IR/ROXICODONE) 5 MG immediate release tablet Take 1-2 tablets (5-10 mg total) by mouth every 6 (six) hours as needed for severe pain. 10/02/17  Yes [name], MD  oxyCODONE (OXYCONTIN) 10 mg 12 hr tablet Take 1 tablet (10 mg total) by mouth at bedtime. 07/25/17  Yes [name], MD  VELPHORO 500 MG chewable tablet Chew 500-1,500 mg by mouth See admin instructions. Take '500mg'$  to '1500mg'$  depending on the meal size 12/28/16  Yes [provider]    Current Facility-Administered Medications  Medication Dose Route Frequency Provider Last Rate Last Dose  . 0.9 %  sodium chloride infusion  250 mL Intravenous PRN Martinique, Peter M, MD      . acetaminophen (TYLENOL) tablet 650 mg  650 mg Oral Q4H PRN Ledora Bottcher, PA      . aspirin chewable tablet 324 mg  324 mg Oral NOW Duke, Tami Lin, PA       Or  . aspirin suppository 300 mg  300 mg Rectal NOW Duke, Tami Lin, PA      . calcium gluconate 1 g in sodium chloride 0.9 % 100 mL IVPB  1 g Intravenous Once Martinique, Peter M, MD      . chlorhexidine gluconate (MEDLINE KIT) (PERIDEX) 0.12 % solution 15 mL  15 mL Mouth Rinse BID Erick Colace, NP      .  famotidine (PEPCID) IVPB 20 mg premix  20 mg Intravenous QHS Martinique, Peter M, MD      . fentaNYL (SUBLIMAZE) 100 MCG/2ML injection           . fentaNYL (SUBLIMAZE) 100 MCG/2ML injection           . fentaNYL (SUBLIMAZE) bolus via infusion 25 mcg  25 mcg Intravenous Q1H PRN Martinique, Peter M, MD      . fentaNYL 2584mg in NS 2523m(1079mml) infusion-PREMIX  25-400 mcg/hr Intravenous Continuous JorMartiniqueeter M, MD 2.5 mL/hr at 11/25/17 1455 25 mcg/hr at 11/25/17 1455  . MEDLINE mouth rinse  15 mL Mouth Rinse 10 times per day BabErick ColaceP      . midazolam (VERSED) 2 MG/2ML injection           . nitroGLYCERIN 50 mg in dextrose 5 % 250 mL (0.2 mg/mL) infusion  0-200 mcg/min Intravenous Titrated DukLedora BottcherA   Stopped at 11/25/17 1310  . nitroGLYCERIN 50 mg in dextrose 5 % 250 mL (0.2 mg/mL) infusion  0-30 mcg/min Intravenous Titrated Duke, AngTami LinA      . ondansetron (ZOHattiesburg Clinic Ambulatory Surgery Centernjection 4 mg  4 mg Intravenous Q6H PRN DukLedora BottcherA      . propofol (DIPRIVAN) 1000 MG/100ML infusion           . propofol (DIPRIVAN) 1000 MG/100ML infusion  0-50 mcg/kg/min Intravenous Continuous McALauree Chandler MD      . sodium chloride flush (NS) 0.9 % injection 3 mL  3 mL Intravenous Q12H JorMartiniqueeter M, MD      .  sodium chloride flush (NS) 0.9 % injection 3 mL  3 mL Intravenous PRN JorMartiniqueeter M, MD       Labs: Basic Metabolic Panel: Recent Labs  Lab 11/25/17 1040 11/25/17 1059  NA 140 136  K 4.0 3.8  CL 94* 96*  CO2 27  --   GLUCOSE 126* 128*  BUN 50* 47*  CREATININE 11.99* 12.10*  CALCIUM 9.8  --    Liver Function Tests: Recent Labs  Lab 11/25/17 1040  AST 49*  ALT 20  ALKPHOS 55  BILITOT 1.5*  PROT 7.2  ALBUMIN 3.9   CBC: Recent Labs  Lab 11/25/17 1040 11/25/17 1059  WBC 9.3  --   NEUTROABS 7.6  --   HGB 11.7* 12.9*  HCT 37.3* 38.0*  MCV 101.4*  --   PLT PLATELET CLUMPS NOTED ON SMEAR, COUNT APPEARS DECREASED  --    Cardiac Enzymes: Recent Labs  Lab 11/25/17 1306  TROPONINI 16.18*    Studies/Results: Dg Chest 2 View  Result Date: 11/25/2017 CLINICAL DATA:  Productive cough. EXAM: CHEST - 2 VIEW COMPARISON:  10/12/2017 FINDINGS: There is new mild cardiomegaly with small bilateral pleural effusions and diffuse hazy bilateral infiltrates consistent with pulmonary edema. Pulmonary vascularity is within normal limits. No bone abnormality. IMPRESSION: New bilateral pulmonary edema, new mild cardiomegaly, and new small bilateral pleural effusions, all consistent with congestive heart failure. Electronically Signed   By: JamLorriane ShireD.   On: 11/25/2017 09:46   Portable Chest X-ray  Result Date: 11/25/2017 CLINICAL DATA:  Intubated EXAM: PORTABLE CHEST 1 VIEW COMPARISON:  Earlier same day FINDINGS: Endotracheal tube tip is 5 cm above the carina. Nasogastric tube enters the abdomen. Interstitial and early alveolar pulmonary edema may be minimally improved. No worsening or new finding. IMPRESSION: Endotracheal tube tip 5 cm above the carina. Interstitial and alveolar edema, possibly minimally  improved. Electronically Signed   By: Nelson Chimes M.D.   On: 11/25/2017 16:18   Dialysis Orders:  MWF at [facility] 4hr, 400/800, EDW 88.5kg, 2K/2Ca, UF#2, AVF, heparin 3000 bolus -  Mircera 178mg q 2 weeks - Calcitriol 1.59m PO q HD - Venofer '50mg'$  IV weekly  Assessment/Plan: 1.  NSTEMI: Trop > 13 with ST depression. S/p LHC urgently 9/16; no area for PCI - occluded RCA, moderate non-occlusive disease in LAD, LCx. Echo pending. 2.  Flash pulmonary edema: Required intubation for the above. For HD this evening with fluid removal. 3.  ESRD: Will plan to dialyze today - will try to get volume down. 4.  Hypertension/volume: BP down slightly on monitor, + edema/pulm edema. 5.  Anemia: Hgb > 12, no ESA needed for now. 6.  Metabolic bone disease: Ca ok. Phos pending. 7.  Amyloidosis  KaVeneta PentonPA-C 11/25/2017, 4:24 PM  [facility] Pager: [phone] seen, examined and agree w A/P as above. ESRD pt presented w/ acute resp distress, +trop, +ischemic EKG changes (inferolateral) and acute pulm edema. Went to cath lab which showed 100% RCA, no severe L sided CAD.  LVEDP was 18, not sig high. Suspect ischemia cause of pulm edema.  Plan HD tonight, UF as tol.    RoKelly SplinterD CaNewell Rubbermaidager [phone] 11/25/2017, 5:22 PM

## 2017-11-25 NOTE — Consult Note (Deleted)
Cardiology Consultation:   Patient ID: Jose Garrett MRN: [id number]; DOB: [birth year]  Admit date: 11/25/2017 Date of Consult: 11/25/2017  Primary Care Provider: [name], MD Primary Cardiologist: Lauree Chandler, MD  Primary Electrophysiologist:  None    Patient Profile:   Jose Garrett is a 67 y.o. male with a hx of amyloidosis (Followed at [facility]), ESRD on HD (Dr. Detterding), HTN, HLD, GERD, and chronic diastolic heart failure who is being seen today for the evaluation of elevated troponin at the request of Dr. Melina Copa.  History of Present Illness:   Jose Garrett does not currently follow with cardiology. He had an echo 10/15/17 - LVEF 50-55%, moderate AS, and no wall motion abnormality. She has impaired diastolic dysfunction, but could not quantify. He presented to [facility] with ongoing fatigue since Saturday. His last HD session was on Friday (MWF HD). He was unable to start HD this morning because he was told he was at his dry weight, which is unusual for him.   On my interview, he states he has been excessively tired and slept frequently Sat and Sunday. He reported to HD and they asked him if he wanted to be seen in the ER and he did. On arrival, he was hemodynamically stable. First POC troponin over 13. However, he became acutely short of breath requiring BiPAP due to increased work of breathing and hypoxia. He also developed hypertensive urgency with systolic pressure greater than 200, started on nitro drip. Pressure now systolic 086V. He denies chest pain, but states he can't say for sure.  EDP did not start heparin due to some mild hemoptysis with cough prior to BiPAP.   Past Medical History:  Diagnosis Date  . Amyloidosis ([place])   . Anemia Dx 2016  . Chronic diastolic CHF (congestive heart failure), NYHA class 1 ([place])    /hospital problem list 09/08/2014  . Chronic renal disease, stage III ([place])    /hospital problem list 09/08/2014  . Diarrhea   . Dyspnea    with exertion   . Enlarged heart 2015  . Family history of adverse reaction to anesthesia    "my daughter can't take certain anesthesia agents" (09/08/2014)  . GERD (gastroesophageal reflux disease)   . History of blood transfusion 09/08/2014   "got hematoma after renal biopsy & HgB dropped"  . Hyperlipidemia Dx 2012  . Hypertension Dx 2012  . Renal disorder    "there is a spot on my kidney" (09/08/2014)  . Restless legs     Past Surgical History:  Procedure Laterality Date  . AV FISTULA PLACEMENT Right 12/27/2015   Procedure: Right Arm ARTERIOVENOUS (AV) FISTULA CREATION;  Surgeon: Angelia Mould, MD;  Location: [facility];  Service: Vascular;  Laterality: Right;  . COLONOSCOPY    . IR GENERIC HISTORICAL  01/27/2016   IR FLUORO GUIDE CV LINE RIGHT 01/27/2016 Arne Cleveland, MD [facility]-INTERV RAD  . IR GENERIC HISTORICAL  01/27/2016   IR US GUIDE VASC ACCESS RIGHT 01/27/2016 Arne Cleveland, MD [facility]INTER[facility]  . LAPAROSCOPIC CHOLECYSTECTOMY  03/2011  . RENAL BIOPSY, PERCUTANEOUS Right 09/08/2014  . TONSILLECTOMY  ~ 1960     Home Medications:  Prior to Admission medications   Medication Sig Start Date End Date Taking? Authorizing Provider  acetaminophen (TYLENOL) 325 MG tablet Take 325-650 mg by mouth every 6 (six) hours as needed (back pain).   Yes [provider]  amitriptyline (ELAVIL) 50 MG tablet Take 1 tablet (50 mg total) by mouth at bedtime. 07/25/17  Yes [name], MD  aspirin EC 81 MG tablet Take 1 tablet (81 mg total) by mouth daily. 01/14/15  Yes [name], MD  B Complex-C-Folic Acid (DIALYVITE 469) 0.8 MG TABS Take 0.8 mg by mouth daily.   Yes [provider]  BESIVANCE 0.6 % SUSP Place 1 drop into both eyes 3 (three) times daily. 11/01/17  Yes [provider]  cetirizine (ZYRTEC) 10 MG tablet Take 10 mg by mouth daily as needed for allergies.    Yes [provider]  Cholecalciferol (VITAMIN D3) 3000 units TABS Take 3,000 Units by mouth 3  (three) times a week.    Yes [provider]  cyanocobalamin 2000 MCG tablet Take 2,500 mcg by mouth daily.   Yes [provider]  DUREZOL 0.05 % EMUL Place 1 drop into both eyes 3 (three) times daily. 11/01/17  Yes [provider]  gabapentin (NEURONTIN) 300 MG capsule 300mg  po qAM and 600mg  po qHS Patient taking differently: Take 300-600 mg by mouth See admin instructions. 300mg  po qAM and 600mg  po qHS 05/14/17  Yes [name], MD  LORazepam (ATIVAN) 1 MG tablet Take 1 tablet (1 mg total) by mouth daily as needed for anxiety (nausea). 01/08/17  Yes [name], MD  oxyCODONE (OXY IR/ROXICODONE) 5 MG immediate release tablet Take 1-2 tablets (5-10 mg total) by mouth every 6 (six) hours as needed for severe pain. 10/02/17  Yes [name], MD  oxyCODONE (OXYCONTIN) 10 mg 12 hr tablet Take 1 tablet (10 mg total) by mouth at bedtime. 07/25/17  Yes [name], MD  VELPHORO 500 MG chewable tablet Chew 500-1,500 mg by mouth See admin instructions. Take 500mg  to 1500mg  depending on the meal size 12/28/16  Yes [provider]    Inpatient Medications: Scheduled Meds:  Continuous Infusions: . sodium chloride 75 mL/hr at 11/25/17 1254  . nitroGLYCERIN 10 mcg/min (11/25/17 1245)   PRN Meds:   Allergies:    Allergies  Allergen Reactions  . No Known Allergies     Social History:   Social History   Socioeconomic History  . Marital status: Divorced    Spouse name: Not on file  . Number of children: Not on file  . Years of education: Not on file  . Highest education level: Not on file  Occupational History  . Not on file  Social Needs  . Financial resource strain: Not on file  . Food insecurity:    Worry: Not on file    Inability: Not on file  . Transportation needs:    Medical: Not on file    Non-medical: Not on file  Tobacco Use  . Smoking status: Former Smoker    Packs/day: 2.00    Years: 40.00    Pack years:  80.00    Types: Cigarettes    Last attempt to quit: 03/19/2011    Years since quitting: 6.6  . Smokeless tobacco: Never Used  Substance and Sexual Activity  . Alcohol use: No    Alcohol/week: 0.0 standard drinks  . Drug use: No    Comment: 09/08/2014 "I have used marijuana till the 1990's". Notes that he quit 15 yrs ago  . Sexual activity: Not Currently  Lifestyle  . Physical activity:    Days per week: Not on file    Minutes per session: Not on file  . Stress: Not on file  Relationships  . Social connections:    Talks on phone: Not on  file    Gets together: Not on file    Attends religious service: Not on file    Active member of club or organization: Not on file    Attends meetings of clubs or organizations: Not on file    Relationship status: Not on file  . Intimate partner violence:    Fear of current or ex partner: Not on file    Emotionally abused: Not on file    Physically abused: Not on file    Forced sexual activity: Not on file  Other Topics Concern  . 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     Family History:    Family History  Problem Relation Age of Onset  . Hypertension Mother   . Diabetes Mother   . Heart disease Mother   . Skin cancer Mother   . Hypertension Father   . Diabetes Father   . Diabetes Sister      ROS:  Please see the history of present illness.   All other ROS reviewed and negative.     Physical Exam/Data:   Vitals:   11/25/17 1155 11/25/17 1200 11/25/17 1215 11/25/17 1230  BP: (!) 177/102 (!) 177/94 (!) 165/93 (!) 150/82  Pulse: (!) 125 (!) 124 (!) 120 (!) 115  Resp: (!) 27 (!) 34 (!) 32 (!) 31  Temp:      TempSrc:      SpO2: 98% 95% 95% 97%   No intake or output data in the 24 hours ending 11/25/17 1255 There were no vitals filed for this visit. There is no height or weight on file  to calculate BMI.  General:  Respiratory distress HEENT: normal Neck: no JVD Vascular: No carotid bruits Cardiac:  Tachycardic rate, + murmur on exam Lungs:  On BiPAP Abd: soft, nontender, no hepatomegaly  Ext: trace edema, Right AV fistula Musculoskeletal:  No deformities, BUE and BLE strength normal and equal Skin: warm and dry  Neuro:  CNs 2-12 intact, no focal abnormalities noted Psych:  Normal affect   EKG:  The EKG was personally reviewed and demonstrates:  Tachycardic, ST depression inferior leads and repolarization abnormality, RBBB Telemetry:  Telemetry was personally reviewed and demonstrates:  Tachycardic rate in the 110-120s  Relevant CV Studies:  Echo 10/13/17: Study Conclusions - Left ventricle: The cavity size was normal. There was moderate   concentric hypertrophy. Systolic function was normal. The   estimated ejection fraction was in the range of 50% to 55%. Wall   motion was normal; there were no regional wall motion   abnormalities. - Aortic valve: Trileaflet; moderately thickened, severely   calcified leaflets. There was moderate stenosis. There was   moderate regurgitation. Mean gradient (S): 37 mm Hg. Peak   gradient (S): 57 mm Hg. Valve area (VTI): 1.28 cm^2. Valve area   (Vmax): 1.17 cm^2. Valve area (Vmean): 1.06 cm^2. Regurgitation   pressure half-time: 481 ms. - Mitral valve: Calcified annulus. Mildly thickened leaflets .   There was trivial regurgitation. - Left atrium: The atrium was mildly dilated. - Right ventricle: Systolic function was normal. - Atrial septum: No defect or patent foramen ovale was identified. - Tricuspid valve: There was trivial regurgitation. - Pulmonic valve: There was trivial regurgitation.  Impressions: - Preserved LV systolic function, impaired relaxation but   indeterminate for quantitation of  diastolic dysfunction. Aortic   stenosis is now moderate with moderate aortic regurgitation as   well. Right sided pressures  cannot be assessed due to lack of TR   jet.  Laboratory Data:  Chemistry Recent Labs  Lab 11/25/17 1040 11/25/17 1059  NA 140 136  K 4.0 3.8  CL 94* 96*  CO2 27  --   GLUCOSE 126* 128*  BUN 50* 47*  CREATININE 11.99* 12.10*  CALCIUM 9.8  --   GFRNONAA 4*  --   GFRAA 4*  --   ANIONGAP 19*  --     Recent Labs  Lab 11/25/17 1040  PROT 7.2  ALBUMIN 3.9  AST 49*  ALT 20  ALKPHOS 55  BILITOT 1.5*   Hematology Recent Labs  Lab 11/25/17 1040 11/25/17 1059  WBC 9.3  --   RBC 3.68*  --   HGB 11.7* 12.9*  HCT 37.3* 38.0*  MCV 101.4*  --   MCH 31.8  --   MCHC 31.4  --   RDW 15.6*  --   PLT PLATELET CLUMPS NOTED ON SMEAR, COUNT APPEARS DECREASED  --    Cardiac EnzymesNo results for input(s): TROPONINI in the last 168 hours.  Recent Labs  Lab 11/25/17 1056 11/25/17 1236  TROPIPOC 13.34* 12.79*    BNPNo results for input(s): BNP, PROBNP in the last 168 hours.  DDimer No results for input(s): DDIMER in the last 168 hours.  Radiology/Studies:  Dg Chest 2 View  Result Date: 11/25/2017 CLINICAL DATA:  Productive cough. EXAM: CHEST - 2 VIEW COMPARISON:  10/12/2017 FINDINGS: There is new mild cardiomegaly with small bilateral pleural effusions and diffuse hazy bilateral infiltrates consistent with pulmonary edema. Pulmonary vascularity is within normal limits. No bone abnormality. IMPRESSION: New bilateral pulmonary edema, new mild cardiomegaly, and new small bilateral pleural effusions, all consistent with congestive heart failure. Electronically Signed   By: [name] M.D.   On: 11/25/2017 09:46    Assessment and Plan:   1. Elevated troponin, no history of CAD - troponin 13.34, repeat pending - EKG with ST depression inferior leads, repolarization abnormality, RBBB (old) - pt denies chest pain, but had significant fatigue and low PO intake all weekend - will hold off on fluids for now - will start heparin drip - plan to consult PCCM for intubation with plans  for heart cath this afternoon after stable on vent   2. ESRD on HD - pt compliant, but was told he was at his dry weight today - he has poor PO intake over the weekend - lower extremity swelling present, CXR with edema - recommend nephrology consult   3. Chronic diastolic heart failure - will repeat echo after heart cath  - hold off on lasix, will defer to nephrology   4. Moderate aortic stenosis - murmur on exam - likely contributing to shortness of breath      For questions or updates, please contact [place] Please consult [email or web address] for contact info under     Signed, Ledora Bottcher, PA  11/25/2017 12:55 PM

## 2017-11-25 NOTE — Progress Notes (Signed)
Site area: RFA x 1 Site Prior to Removal:  Level 0 Pressure Applied For: 25 minutes Manual:   yes Patient Status During Pull:  stable Post Pull Site:  Level 0 Post Pull Instructions Given: yes  Post Pull Pulses Present: palpable Dressing Applied:  tegaderm and gauze Bedrest begins @ 1540 Comments:

## 2017-11-25 NOTE — Progress Notes (Signed)
[name] was admitted today with respiratory distress, hypertensive crisis and tachycardia. Bipap was used in the ED but he continued to be dyspneic with orthopnea. Given rising troponin and diffuse ST depression on EKG, urgent cardiac cath performed. His RCA is occluded but could not be engaged. The distal RCA is seen to fill from left to right collaterals. The LAD has moderate non-obstructive disease. The Circumflex has a moderately severe ostial stenosis. There is no focal lesion for PCI. It is unclear how long the RCA has been occluded. LVEDP of 19 mmHg by cath. With sedation, his BP is much better controlled. His heart rate is now in the 90s. He remains intubated and sedated.   His acute decompensation appears to be due to hypertensive crisis in setting of moderate CAD with acute diastolic CHF. He he also has moderately severe aortic valve stenosis by echo in August 2019. His peak to peak gradient in the cath lab suggests moderate aortic stenosis.   Will ask Nephrology to see tonight to start HD. Will cycle troponin to follow trend.  Will arrange echo to assess for wall motion abnormalities.  IV NTG as needed for BP control.  Resume IV heparin 8 hours post sheath pull and continue for 48 hours.  Will start ASA now and start a statin prior to discharge.   Supportive care for now. PCCM managing the ventilator.   Lauree Chandler 11/25/2017 3:39 PM

## 2017-11-25 NOTE — ED Notes (Signed)
Pt is a dialysis pt and states he does not make urine.

## 2017-11-25 NOTE — Progress Notes (Signed)
ANTICOAGULATION CONSULT NOTE - Initial Consult  Pharmacy Consult for heparin Indication: chest pain/ACS   Patient Measurements: Weight: 203 lb 14.8 oz (92.5 kg) Heparin Dosing Weight: 91 kg  Vital Signs: Temp: 98.3 F (36.8 C) (09/16 0921) Temp Source: Oral (09/16 0921) BP: 182/92 (09/16 1325) Pulse Rate: 129 (09/16 1325)  Labs: Recent Labs    11/25/17 1040 11/25/17 1059  HGB 11.7* 12.9*  HCT 37.3* 38.0*  PLT PLATELET CLUMPS NOTED ON SMEAR, COUNT APPEARS DECREASED  --   CREATININE 11.99* 12.10*    Assessment: Admitted with ongoing fatigue. He became acutely short of breath in ED, required bipap and eventually required to be intubated. He is ESRD on HD. His troponin is elevated. Pharmacy was consulted to start heparin; however, the patient was taken to cardiac cath lab prior to initiation.  Goal of Therapy:  Heparin level 0.3-0.7 units/ml Monitor platelets by anticoagulation protocol: Yes   Plan:  -Heparin bolus 4000 units x1 then 1100 units/hr -Daily HL, CBC -F/u after cath   Harvel Quale 11/25/2017,1:29 PM

## 2017-11-25 NOTE — ED Provider Notes (Signed)
[facility] EMERGENCY DEPARTMENT Provider Note   CSN: [id number] Arrival date & time: 11/25/17  0920     History   Chief Complaint Chief Complaint  Patient presents with  . Weakness  . Fatigue    HPI Jose Garrett is a 67 y.o. male.  He has end-stage renal disease dialysis Monday Wednesday Friday and his nephrologist is Dr. Esau Grew.  He is complaining of excessive fatigue since Saturday.  He said he is just been sleeping and feels generally unwell.  He did receive dialysis on Friday without difficulties.  He went to dialysis today and they told him that his weight was at his baseline which is unusual.  He did not receive treatment today.  He states he only ate a little bit through the weekend just because he did not feel well.  There is been a little cough with some sputum.  Some vague chest discomfort.  No fevers no chills.  He thinks his hemoglobin may be low.  He has not noticed any overt bleeding.  No vomiting no diarrhea no abdominal pain.  The history is provided by the patient.  Weakness  Primary symptoms include no focal weakness. This is a new problem. The current episode started 2 days ago. The problem has not changed since onset.There was no focality noted. There has been no fever. Associated symptoms include chest pain. Pertinent negatives include no shortness of breath, no vomiting, no altered mental status, no confusion and no headaches. There were no medications administered prior to arrival.    Past Medical History:  Diagnosis Date  . Amyloidosis ([place])   . Anemia Dx 2016  . Chronic diastolic CHF (congestive heart failure), NYHA class 1 ([place])    /hospital problem list 09/08/2014  . Chronic renal disease, stage III ([place])    /hospital problem list 09/08/2014  . Diarrhea   . Dyspnea    with exertion  . Enlarged heart 2015  . Family history of adverse reaction to anesthesia    "my daughter can't take certain anesthesia agents" (09/08/2014)  . GERD  (gastroesophageal reflux disease)   . History of blood transfusion 09/08/2014   "got hematoma after renal biopsy & HgB dropped"  . Hyperlipidemia Dx 2012  . Hypertension Dx 2012  . Renal disorder    "there is a spot on my kidney" (09/08/2014)  . Restless legs     Patient Active Problem List   Diagnosis Date Noted  . Neuropathy 03/14/2017  . Fever   . CKD (chronic kidney disease) stage 5, GFR less than 15 ml/min (HCC)   . ESRD (end stage renal disease) ([place])   . Acute renal failure superimposed on chronic kidney disease ([place])   . Hypervolemia   . Acute on chronic diastolic CHF (congestive heart failure) ([place]) 01/23/2016  . Fluid overload, unspecified 01/23/2016  . Acute pulmonary edema ([place]) 01/23/2016  . Skin tear of right forearm without complication 76/28/3151  . Iron deficiency anemia 11/22/2015  . Mild aortic stenosis 08/22/2015  . Chronic kidney disease with dialysis modality undecided, stage 5 ([place]): Progressive 08/01/2015  . B12 deficiency 07/19/2015  . Seasonal allergies 06/30/2015  . Back pain 03/18/2015  . Hip pain 02/18/2015  . AL amyloidosis ([place]) 09/25/2014  . Anemia of chronic disease 09/25/2014  . Abnormal bruising 09/25/2014  . Renal hematoma, right 09/08/2014  . Chronic diastolic heart failure, NYHA class 1 ([place]) 09/08/2014  . Hematuria   . Proteinuria   . Vitamin D deficiency 01/21/2014  .  Hypertriglyceridemia 01/21/2014  . Essential hypertension, benign 11/06/2013  . Dependent edema 11/06/2013  . DOE (dyspnea on exertion) 11/06/2013  . Other malaise and fatigue 11/06/2013    Past Surgical History:  Procedure Laterality Date  . AV FISTULA PLACEMENT Right 12/27/2015   Procedure: Right Arm ARTERIOVENOUS (AV) FISTULA CREATION;  Surgeon: Angelia Mould, MD;  Location: [facility];  Service: Vascular;  Laterality: Right;  . COLONOSCOPY    . IR GENERIC HISTORICAL  01/27/2016   IR FLUORO GUIDE CV LINE RIGHT 01/27/2016 Arne Cleveland, MD [facility]INTER[facility]  . IR  GENERIC HISTORICAL  01/27/2016   IR US GUIDE VASC ACCESS RIGHT 01/27/2016 Arne Cleveland, MD [facility]  . LAPAROSCOPIC CHOLECYSTECTOMY  03/2011  . RENAL BIOPSY, PERCUTANEOUS Right 09/08/2014  . TONSILLECTOMY  ~ 1960        Home Medications    Prior to Admission medications   Medication Sig Start Date End Date Taking? Authorizing Provider  acetaminophen (TYLENOL) 325 MG tablet Take 325-650 mg by mouth every 6 (six) hours as needed (back pain).    [provider]  acyclovir (ZOVIRAX) 400 MG tablet TAKE 1 TABLET BY MOUTH DAILY. (DOSE ADJUSTED FOR RENAL FUNCTION) Patient not taking: Reported on 10/12/2017 02/26/17   [name], MD  amitriptyline (ELAVIL) 50 MG tablet Take 1 tablet (50 mg total) by mouth at bedtime. 07/25/17   [name], MD  aspirin EC 81 MG tablet Take 1 tablet (81 mg total) by mouth daily. 01/14/15   [name], MD  B Complex-C-Folic Acid (DIALYVITE 536) 0.8 MG TABS Take 0.8 mg by mouth daily.    [provider]  cetirizine (ZYRTEC) 10 MG tablet Take 10 mg by mouth daily.    [provider]  Cholecalciferol (VITAMIN D3) 3000 units TABS Take 3,000 Units by mouth 3 (three) times a week.     [provider]  cyanocobalamin 2000 MCG tablet Take 2,500 mcg by mouth daily.    [provider]  gabapentin (NEURONTIN) 300 MG capsule 300mg  po qAM and 600mg  po qHS Patient taking differently: Take 300-600 mg by mouth See admin instructions. 300mg  po qAM and 600mg  po qHS 05/14/17   [name], MD  LORazepam (ATIVAN) 1 MG tablet Take 1 tablet (1 mg total) by mouth daily as needed for anxiety (nausea). 01/08/17   [name], MD  multivitamin (RENA-VIT) TABS tablet Take 1 tablet by mouth daily.    [provider]  oxyCODONE (OXY IR/ROXICODONE) 5 MG immediate release tablet Take 1-2 tablets (5-10 mg total) by mouth every 6 (six) hours as needed for severe pain. 10/02/17   [name],  MD  oxyCODONE (OXYCONTIN) 10 mg 12 hr tablet Take 1 tablet (10 mg total) by mouth at bedtime. Patient not taking: Reported on 10/12/2017 07/25/17   [name], MD  [facility] 500 MG chewable tablet Chew 500-1,500 mg by mouth See admin instructions. Take 500mg  to 1500mg  depending on the meal size 12/28/16   [provider]    Family History Family History  Problem Relation Age of Onset  . Hypertension Mother   . Diabetes Mother   . Heart disease Mother   . Skin cancer Mother   . Hypertension Father   . Diabetes Father   . Diabetes Sister     Social History Social History   Tobacco Use  . Smoking status: Former Smoker    Packs/day: 2.00    Years: 40.00    Pack  years: 80.00    Types: Cigarettes    Last attempt to quit: 03/19/2011    Years since quitting: 6.6  . Smokeless tobacco: Never Used  Substance Use Topics  . Alcohol use: No    Alcohol/week: 0.0 standard drinks  . Drug use: No    Comment: 09/08/2014 "I have used marijuana till the 1990's". Notes that he quit 15 yrs ago     Allergies   Other and No known allergies   Review of Systems Review of Systems  Constitutional: Positive for activity change, appetite change and fatigue. Negative for chills and fever.  HENT: Negative for sore throat.   Eyes: Negative for visual disturbance.  Respiratory: Negative for shortness of breath.   Cardiovascular: Positive for chest pain.  Gastrointestinal: Negative for abdominal pain and vomiting.  Genitourinary: Negative for dysuria.  Musculoskeletal: Negative for back pain and neck pain.  Skin: Negative for rash.  Neurological: Positive for weakness. Negative for focal weakness and headaches.  Psychiatric/Behavioral: Negative for confusion.     Physical Exam Updated Vital Signs BP (!) 188/80 (BP Location: Left Arm)   Pulse (!) 109   Temp 98.3 F (36.8 C) (Oral)   Resp 20   SpO2 93%   Physical Exam  Constitutional: He is oriented to person, place, and  time. He appears well-developed and well-nourished.  HENT:  Head: Normocephalic and atraumatic.  Eyes: Conjunctivae are normal.  Neck: Neck supple.  Cardiovascular: Normal rate and regular rhythm.  Murmur heard.  Systolic murmur is present with a grade of 4/6. Pulmonary/Chest: Effort normal and breath sounds normal. No respiratory distress.  Abdominal: Soft. There is no tenderness.  Musculoskeletal: He exhibits edema (1+edema bilat) and tenderness. He exhibits no deformity.  Fistula right forearm with positive thrill.  No overlying erythema.  Neurological: He is alert and oriented to person, place, and time. He has normal strength. GCS eye subscore is 4. GCS verbal subscore is 5. GCS motor subscore is 6.  Skin: Skin is warm and dry. Capillary refill takes less than 2 seconds.  Psychiatric: He has a normal mood and affect.  Nursing note and vitals reviewed.    ED Treatments / Results  Labs (all labs ordered are listed, but only abnormal results are displayed) Labs Reviewed  COMPREHENSIVE METABOLIC PANEL - Abnormal; Notable for the following components:      Result Value   Chloride 94 (*)    Glucose, Bld 126 (*)    BUN 50 (*)    Creatinine, Ser 11.99 (*)    AST 49 (*)    Total Bilirubin 1.5 (*)    GFR calc non Af Amer 4 (*)    GFR calc Af Amer 4 (*)    Anion gap 19 (*)    All other components within normal limits  CBC WITH DIFFERENTIAL/PLATELET - Abnormal; Notable for the following components:   RBC 3.68 (*)    Hemoglobin 11.7 (*)    HCT 37.3 (*)    MCV 101.4 (*)    RDW 15.6 (*)    All other components within normal limits  BRAIN NATRIURETIC PEPTIDE - Abnormal; Notable for the following components:   B Natriuretic Peptide 4,098.0 (*)    All other components within normal limits  TROPONIN I - Abnormal; Notable for the following components:   Troponin I 16.18 (*)    All other components within normal limits  TROPONIN I - Abnormal; Notable for the following components:    Troponin I 23.24 (*)  All other components within normal limits  TROPONIN I - Abnormal; Notable for the following components:   Troponin I 22.43 (*)    All other components within normal limits  GLUCOSE, CAPILLARY - Abnormal; Notable for the following components:   Glucose-Capillary 120 (*)    All other components within normal limits  I-STAT TROPONIN, ED - Abnormal; Notable for the following components:   Troponin i, poc 13.34 (*)    All other components within normal limits  I-STAT CHEM 8, ED - Abnormal; Notable for the following components:   Chloride 96 (*)    BUN 47 (*)    Creatinine, Ser 12.10 (*)    Glucose, Bld 128 (*)    Calcium, Ion 1.08 (*)    Hemoglobin 12.9 (*)    HCT 38.0 (*)    All other components within normal limits  I-STAT TROPONIN, ED - Abnormal; Notable for the following components:   Troponin i, poc 12.79 (*)    All other components within normal limits  POCT I-STAT 3, ART BLOOD GAS (G3+) - Abnormal; Notable for the following components:   pCO2 arterial 48.6 (*)    pO2, Arterial 109.0 (*)    Bicarbonate 28.4 (*)    Acid-Base Excess 3.0 (*)    All other components within normal limits  POCT I-STAT 3, ART BLOOD GAS (G3+) - Abnormal; Notable for the following components:   pO2, Arterial 127.0 (*)    All other components within normal limits  MRSA PCR SCREENING  TRIGLYCERIDES  GLUCOSE, CAPILLARY  GLUCOSE, CAPILLARY  URINALYSIS, ROUTINE W REFLEX MICROSCOPIC  TROPONIN I  BASIC METABOLIC PANEL  CBC  MAGNESIUM  PHOSPHORUS  HEPARIN LEVEL (UNFRACTIONATED)  I-STAT CG4 LACTIC ACID, ED  I-STAT ARTERIAL BLOOD GAS, ED    EKG EKG Interpretation  Date/Time:  Monday November 25 2017 09:25:27 EDT Ventricular Rate:  108 PR Interval:  158 QRS Duration: 158 QT Interval:  396 QTC Calculation: 530 R Axis:   110 Text Interpretation:  Sinus tachycardia Right bundle branch block Left posterior fascicular block T wave abnormality, consider inferolateral ischemia  Abnormal ECG increased ischemic changes compared with prior 8/19 Confirmed by Aletta Edouard [phone]) on 11/25/2017 9:43:14 AM   Radiology Dg Chest 2 View  Result Date: 11/25/2017 CLINICAL DATA:  Productive cough. EXAM: CHEST - 2 VIEW COMPARISON:  10/12/2017 FINDINGS: There is new mild cardiomegaly with small bilateral pleural effusions and diffuse hazy bilateral infiltrates consistent with pulmonary edema. Pulmonary vascularity is within normal limits. No bone abnormality. IMPRESSION: New bilateral pulmonary edema, new mild cardiomegaly, and new small bilateral pleural effusions, all consistent with congestive heart failure. Electronically Signed   By: [name] M.D.   On: 11/25/2017 09:46    Procedures .Critical Care Performed by: Hayden Rasmussen, MD Authorized by: Hayden Rasmussen, MD   Critical care provider statement:    Critical care time (minutes):  45   Critical care time was exclusive of:  Separately billable procedures and treating other patients   Critical care was necessary to treat or prevent imminent or life-threatening deterioration of the following conditions:  Cardiac failure and respiratory failure   Critical care was time spent personally by me on the following activities:  Discussions with consultants, evaluation of patient's response to treatment, examination of patient, ordering and performing treatments and interventions, ordering and review of laboratory studies, ordering and review of radiographic studies, pulse oximetry, re-evaluation of patient's condition, obtaining history from patient or surrogate, review of old charts, development of  treatment plan with patient or surrogate and ventilator management   I assumed direction of critical care for this patient from another provider in my specialty: no     (including critical care time)  Medications Ordered in ED Medications - No data to display   Initial Impression / Assessment and Plan / ED Course  I have  reviewed the triage vital signs and the nursing notes.  Pertinent labs & imaging results that were available during my care of the patient were reviewed by me and considered in my medical decision making (see chart for details).  Clinical Course as of Nov 26 808  Mon Nov 25, 2017  1111 Patient's labs coming back now.  His troponin just resulted in its 13.3.  Cardiology has been paged.  Patient will be updated.   [MB]  7654 Went back to talk to patient and he is acutely tachypneic and diaphoretic with audible rales pressures up.  I have called for nitroglycerin and BiPAP.   [MB]  6503 Patient on BiPAP and nitro looks a little bit more comfortable.  Remains tachycardic blood pressures 200s over 100s.   [MB]  5465 To Hansell from cardiology and to see the patient and is asking for critical care to weigh in regarding airway management if he takes patient to Cath Lab.  I have called them they are coming down to evaluate the patient also.   [MB]  6812 Critical care came down electively intubated the patient so he can go to Cath Lab.  I did hear back from nephrology who will evaluate the patient when he gets to the ICU for possible hemodialysis.   [MB]    Clinical Course User Index [MB] Hayden Rasmussen, MD     Final Clinical Impressions(s) / ED Diagnoses   Final diagnoses:  NSTEMI (non-ST elevated myocardial infarction) ([place])  Acute on chronic congestive heart failure, unspecified heart failure type [facility])    ED Discharge Orders    None       Hayden Rasmussen, MD 11/26/17 [id number]

## 2017-11-26 ENCOUNTER — Inpatient Hospital Stay (HOSPITAL_COMMUNITY): Payer: Medicare Other

## 2017-11-26 DIAGNOSIS — I249 Acute ischemic heart disease, unspecified: Secondary | ICD-10-CM

## 2017-11-26 DIAGNOSIS — I25110 Atherosclerotic heart disease of native coronary artery with unstable angina pectoris: Secondary | ICD-10-CM

## 2017-11-26 DIAGNOSIS — I219 Acute myocardial infarction, unspecified: Secondary | ICD-10-CM

## 2017-11-26 DIAGNOSIS — J9601 Acute respiratory failure with hypoxia: Secondary | ICD-10-CM

## 2017-11-26 DIAGNOSIS — I5031 Acute diastolic (congestive) heart failure: Secondary | ICD-10-CM

## 2017-11-26 DIAGNOSIS — I214 Non-ST elevation (NSTEMI) myocardial infarction: Secondary | ICD-10-CM

## 2017-11-26 LAB — CBC
HCT: 31 % — ABNORMAL LOW (ref 39.0–52.0)
Hemoglobin: 9.8 g/dL — ABNORMAL LOW (ref 13.0–17.0)
MCH: 31.9 pg (ref 26.0–34.0)
MCHC: 31.6 g/dL (ref 30.0–36.0)
MCV: 101 fL — ABNORMAL HIGH (ref 78.0–100.0)
PLATELETS: 95 10*3/uL — AB (ref 150–400)
RBC: 3.07 MIL/uL — ABNORMAL LOW (ref 4.22–5.81)
RDW: 15.5 % (ref 11.5–15.5)
WBC: 6.5 10*3/uL (ref 4.0–10.5)

## 2017-11-26 LAB — TROPONIN I
TROPONIN I: 22.43 ng/mL — AB (ref <0.03)
TROPONIN I: 20.47 ng/mL — AB (ref <0.03)

## 2017-11-26 LAB — HEPARIN LEVEL (UNFRACTIONATED): Heparin Unfractionated: 0.16 IU/mL — ABNORMAL LOW (ref 0.30–0.70)

## 2017-11-26 LAB — POCT I-STAT 3, ART BLOOD GAS (G3+)
Acid-Base Excess: 1 mmol/L (ref 0.0–2.0)
BICARBONATE: 26 mmol/L (ref 20.0–28.0)
O2 SAT: 99 %
PCO2 ART: 40.6 mmHg (ref 32.0–48.0)
Patient temperature: 98
TCO2: 27 mmol/L (ref 22–32)
pH, Arterial: 7.412 (ref 7.350–7.450)
pO2, Arterial: 127 mmHg — ABNORMAL HIGH (ref 83.0–108.0)

## 2017-11-26 LAB — ECHOCARDIOGRAM COMPLETE
Height: 74 in
WEIGHTICAEL: 3072.33 [oz_av]

## 2017-11-26 LAB — GLUCOSE, CAPILLARY
Glucose-Capillary: 87 mg/dL (ref 70–99)
Glucose-Capillary: 84 mg/dL (ref 70–99)

## 2017-11-26 LAB — BASIC METABOLIC PANEL
Anion gap: 18 — ABNORMAL HIGH (ref 5–15)
BUN: 48 mg/dL — AB (ref 8–23)
CO2: 26 mmol/L (ref 22–32)
CREATININE: 9.49 mg/dL — AB (ref 0.61–1.24)
Calcium: 8.8 mg/dL — ABNORMAL LOW (ref 8.9–10.3)
Chloride: 94 mmol/L — ABNORMAL LOW (ref 98–111)
GFR calc Af Amer: 6 mL/min — ABNORMAL LOW (ref >60)
GFR, EST NON AFRICAN AMERICAN: 5 mL/min — AB (ref >60)
Glucose, Bld: 86 mg/dL (ref 70–99)
Potassium: 3.7 mmol/L (ref 3.5–5.1)
SODIUM: 138 mmol/L (ref 135–145)

## 2017-11-26 LAB — PHOSPHORUS: PHOSPHORUS: 5.8 mg/dL — AB (ref 2.5–4.6)

## 2017-11-26 LAB — MAGNESIUM: Magnesium: 2.1 mg/dL (ref 1.7–2.4)

## 2017-11-26 MED ORDER — METOPROLOL TARTRATE 25 MG PO TABS: 25.0000 mg | ORAL_TABLET | Freq: Two times a day (BID) | ORAL | Status: DC

## 2017-11-26 MED ORDER — HEPARIN SODIUM (PORCINE) 1000 UNIT/ML DIALYSIS
1000.0000 [IU] | INTRAMUSCULAR | Status: DC | PRN
  Filled 2017-11-26: qty 1

## 2017-11-26 MED ORDER — PENTAFLUOROPROP-TETRAFLUOROETH EX AERO: 1.0000 "application " | INHALATION_SPRAY | CUTANEOUS | Status: DC | PRN

## 2017-11-26 MED ORDER — ORAL CARE MOUTH RINSE
15.0000 mL | Freq: Two times a day (BID) | OROMUCOSAL | Status: DC
  Administered 2017-11-26 – 2017-11-29 (×5): 15 mL via OROMUCOSAL

## 2017-11-26 MED ORDER — ASPIRIN 81 MG PO CHEW
81.0000 mg | CHEWABLE_TABLET | Freq: Every day | ORAL | Status: DC
  Administered 2017-11-26: 81 mg
  Filled 2017-11-26 (×2): qty 1

## 2017-11-26 MED ORDER — FENTANYL CITRATE (PF) 100 MCG/2ML IJ SOLN
12.5000 ug | INTRAMUSCULAR | Status: DC | PRN
  Administered 2017-11-26: 12.5 ug via INTRAVENOUS
  Filled 2017-11-26: qty 2

## 2017-11-26 MED ORDER — LIDOCAINE-PRILOCAINE 2.5-2.5 % EX CREA
1.0000 "application " | TOPICAL_CREAM | CUTANEOUS | Status: DC | PRN
  Filled 2017-11-26: qty 5

## 2017-11-26 MED ORDER — LIDOCAINE HCL (PF) 1 % IJ SOLN: 5.0000 mL | INTRAMUSCULAR | Status: DC | PRN

## 2017-11-26 MED ORDER — ATORVASTATIN CALCIUM 40 MG PO TABS
40.0000 mg | ORAL_TABLET | Freq: Every day | ORAL | Status: DC
  Administered 2017-11-26: 40 mg
  Filled 2017-11-26: qty 1

## 2017-11-26 MED ORDER — SODIUM CHLORIDE 0.9 % IV SOLN: 100.0000 mL | INTRAVENOUS | Status: DC | PRN

## 2017-11-26 MED ORDER — ALBUMIN HUMAN 5 % IV SOLN
25.0000 g | Freq: Once | INTRAVENOUS | Status: DC
  Filled 2017-11-26: qty 500

## 2017-11-26 MED ORDER — ALBUMIN HUMAN 25 % IV SOLN
25.0000 g | Freq: Once | INTRAVENOUS | Status: AC
  Administered 2017-11-26: 25 g via INTRAVENOUS
  Filled 2017-11-26: qty 100

## 2017-11-26 MED ORDER — CARVEDILOL 3.125 MG PO TABS
3.1250 mg | ORAL_TABLET | Freq: Two times a day (BID) | ORAL | Status: DC
  Administered 2017-11-26 – 2017-11-30 (×7): 3.125 mg via ORAL
  Filled 2017-11-26 (×8): qty 1

## 2017-11-26 MED ORDER — HEPARIN SODIUM (PORCINE) 1000 UNIT/ML DIALYSIS
20.0000 [IU]/kg | INTRAMUSCULAR | Status: DC | PRN
  Filled 2017-11-26: qty 2

## 2017-11-26 MED ORDER — HEPARIN SODIUM (PORCINE) 1000 UNIT/ML DIALYSIS: 3000.0000 [IU] | Freq: Once | INTRAMUSCULAR | Status: DC

## 2017-11-26 MED ORDER — CHLORHEXIDINE GLUCONATE CLOTH 2 % EX PADS: 6.0000 | MEDICATED_PAD | Freq: Every day | CUTANEOUS | Status: DC

## 2017-11-26 NOTE — Progress Notes (Signed)
Looks good following hemodialysis. Excellent tidal volumes on spontaneous breathing trial Denies chest pain or shortness of breath Plan Proceed with extubation Wean oxygen Continue pulse oximetry A.m. chest x-ray   Erick Colace ACNP-BC [place] Pager # [phone] OR # [phone] if no answer

## 2017-11-26 NOTE — Progress Notes (Signed)
[place] for heparin Indication: chest pain/ACS  Allergies  Allergen Reactions  . No Known Allergies     Patient Measurements: Height: 6\' 2"  (188 cm) Weight: 192 lb 0.3 oz (87.1 kg) IBW/kg (Calculated) : 82.2 Heparin Dosing Weight: 91kg  Vital Signs: Temp: 98.1 F (36.7 C) (09/17 0745) Temp Source: Axillary (09/17 0745) BP: 104/61 (09/17 0915) Pulse Rate: 80 (09/17 0915)  Labs: Recent Labs    11/25/17 1040 11/25/17 1059 11/25/17 1306 11/25/17 1913 11/26/17 0030 11/26/17 0824  HGB 11.7* 12.9*  --   --   --  9.8*  HCT 37.3* 38.0*  --   --   --  31.0*  PLT PLATELET CLUMPS NOTED ON SMEAR, COUNT APPEARS DECREASED  --   --   --   --  95*  HEPARINUNFRC  --   --   --   --   --  <0.10*  CREATININE 11.99* 12.10*  --   --   --   --   TROPONINI  --   --  16.18* 23.24* 22.43*  --     Estimated Creatinine Clearance: 7.1 mL/min (A) (by C-G formula based on SCr of 12.1 mg/dL (H)).   Medical History: Past Medical History:  Diagnosis Date  . Amyloidosis ([place])   . Anemia Dx 2016  . Chronic diastolic CHF (congestive heart failure), NYHA class 1 ([place])    /hospital problem list 09/08/2014  . Chronic renal disease, stage III ([place])    /hospital problem list 09/08/2014  . Diarrhea   . Dyspnea    with exertion  . Enlarged heart 2015  . Family history of adverse reaction to anesthesia    "my daughter can't take certain anesthesia agents" (09/08/2014)  . GERD (gastroesophageal reflux disease)   . History of blood transfusion 09/08/2014   "got hematoma after renal biopsy & HgB dropped"  . Hyperlipidemia Dx 2012  . Hypertension Dx 2012  . Renal disorder    "there is a spot on my kidney" (09/08/2014)  . Restless legs      Assessment: 67 yo male s/p cath was initiated around midnight 9/17 on heparin 1100 units/hr 8 hrs post sheath removal (femoral approach; removed ~ 3:45pm on 9/16). Currently, patient's anti-Xa is subtherapeutic at <0.10, thus  requiring dose titration. Platelets stable 95; baseline platelets 90-100s s/p [place] for amyloidosis. No concerns of bleeding per nursing.   Goal of Therapy:  Heparin level 0.3-0.7 units/ml Monitor platelets by anticoagulation protocol: Yes   Plan:  -Increase heparin to 1400 units/hr beginning now -Heparin level in 8 hours and daily wth CBC daily  Willia Craze, Pharmacy Student

## 2017-11-26 NOTE — Progress Notes (Signed)
[facility] Progress Note  Subjective: on HD now, BP's soft. CXR still sig edema today  Vitals:   11/26/17 1100 11/26/17 1109 11/26/17 1115 11/26/17 1130  BP: 107/60 107/60 121/63 123/68  Pulse: 79 79 77   Resp: _0 Temp:      TempSrc:      SpO2:  99%  99%  Weight:      Height:        Inpatient medications: . aspirin  81 mg Per Tube Daily  . atorvastatin  40 mg Per Tube q1800  . carvedilol  3.125 mg Oral BID WC  . chlorhexidine gluconate (MEDLINE KIT)  15 mL Mouth Rinse BID  . Chlorhexidine Gluconate Cloth  6 each Topical Q0600  . mouth rinse  15 mL Mouth Rinse 10 times per day  . sodium chloride flush  3 mL Intravenous Q12H   . sodium chloride 10 mL/hr at 11/26/17 1137  . sodium chloride    . sodium chloride    . famotidine (PEPCID) IV Stopped (11/25/17 2351)  . heparin 1,400 Units/hr (11/26/17 1137)  . nitroGLYCERIN Stopped (11/25/17 1310)  . nitroGLYCERIN     sodium chloride, sodium chloride, sodium chloride, acetaminophen, fentaNYL (SUBLIMAZE) injection, heparin, heparin, ipratropium-albuterol, lidocaine (PF), lidocaine-prilocaine, ondansetron (ZOFRAN) IV, pentafluoroprop-tetrafluoroeth, sodium chloride flush  Iron/TIBC/Ferritin/ %Sat    Component Value Date/Time   IRON 45 01/28/2016 1402   IRON 34 (L) 09/24/2014 1224   TIBC 280 01/28/2016 1402   TIBC 284 09/24/2014 1224   FERRITIN 290 12/30/2015 0918   FERRITIN 335 (H) 12/20/2015 1009   IRONPCTSAT 16 (L) 01/28/2016 1402   IRONPCTSAT 12 (L) 09/24/2014 1224   IRONPCTSAT 36 03/02/2014 1227   Home meds:  - amitriptyline 50 hs/ gabapentin 300 am +600 hs  - oxy IR prn/ oxycontin 20m hs/ lorazepam 114mprn  - velphoro ac tid/ ecasa 81/ mvi/ prns  Exam: General: Well developed, sedated/ventilated. Head: Normocephalic, atraumatic. Neck: Supple without lymphadenopathy/masses.  Lungs: Clear bilaterally to auscultation without wheezes, rales, or rhonchi.  Heart: RRR; 3/6 systolic murmur  present. Abdomen: Soft, non-tender, non-distended with normoactive bowel sounds.  Lower extremities: 1+ LE edema Neuro: Sedated Dialysis Access: R forearm AVF + thrill  MWF [facility] 4h   88.5kg  2/2 bath RFA AVF  Hep 3000 - Mircera 10023mq 2 weeks - Calcitriol 1.5mc47mO q HD - Venofer 50mg52mweekly  Assessment: 1.  NSTEMI: Trop > 20 with ST depression. S/p LHC urgently 9/16; no area for PCI - occluded RCA, moderate non-occlusive disease in LAD, LCx. Echo pending. 2.  Flash pulmonary edema: Required intubation, pulm edema by CXR. ON HD now- BP's soft, hopefully can get some vol off 3.  ESRD: HD today and again tomorrow 4.  Hypertension/volume: BP down slightly on monitor, + edema/pulm edema. 5.  Anemia: Hgb > 12, no ESA needed for now. 6.  Metabolic bone disease: Ca ok. Phos pending. 7.  Amyloidosis  Plan - as above   [name] SKelly SplinterarolAdvent[facility] pager [phone]/2019, 11:49 AM   Recent Labs  Lab 11/25/17 1040 11/25/17 1059 11/26/17 0824  NA 140 136 138  K 4.0 3.8 3.7  CL 94* 96* 94*  CO2 27  --  26  GLUCOSE 126* 128* 86  BUN 50* 47* 48*  CREATININE 11.99* 12.10* 9.49*  CALCIUM 9.8  --  8.8*  PHOS  --   --  5.8*  ALBUMIN 3.9  --   --  Recent Labs  Lab 11/25/17 1040  AST 49*  ALT 20  ALKPHOS 55  BILITOT 1.5*  PROT 7.2   Recent Labs  Lab 11/25/17 1040 11/25/17 1059 11/26/17 0824  WBC 9.3  --  6.5  NEUTROABS 7.6  --   --   HGB 11.7* 12.9* 9.8*  HCT 37.3* 38.0* 31.0*  MCV 101.4*  --  101.0*  PLT PLATELET CLUMPS NOTED ON SMEAR, COUNT APPEARS DECREASED  --  95*

## 2017-11-26 NOTE — Progress Notes (Signed)
[place] for heparin Indication: chest pain/ACS  Allergies  Allergen Reactions  . No Known Allergies     Patient Measurements: Height: 6\' 2"  (188 cm) Weight: 185 lb 6.5 oz (84.1 kg) IBW/kg (Calculated) : 82.2 Heparin Dosing Weight: 84.1 kg  Vital Signs: Temp: 98.1 F (36.7 C) (09/17 1145) Temp Source: Axillary (09/17 1145) BP: 159/69 (09/17 1800) Pulse Rate: 96 (09/17 1800)  Labs: Recent Labs    11/25/17 1040 11/25/17 1059  11/25/17 1913 11/26/17 0030 11/26/17 0824 11/26/17 1844  HGB 11.7* 12.9*  --   --   --  9.8*  --   HCT 37.3* 38.0*  --   --   --  31.0*  --   PLT PLATELET CLUMPS NOTED ON SMEAR, COUNT APPEARS DECREASED  --   --   --   --  95*  --   HEPARINUNFRC  --   --   --   --   --  <0.10* 0.16*  CREATININE 11.99* 12.10*  --   --   --  9.49*  --   TROPONINI  --   --    < > 23.24* 22.43* 20.47*  --    < > = values in this interval not displayed.    Estimated Creatinine Clearance: 9 mL/min (A) (by C-G formula based on SCr of 9.49 mg/dL (H)).   Medical History: Past Medical History:  Diagnosis Date  . Amyloidosis ([place])   . Anemia Dx 2016  . Chronic diastolic CHF (congestive heart failure), NYHA class 1 ([place])    /hospital problem list 09/08/2014  . Chronic renal disease, stage III ([place])    /hospital problem list 09/08/2014  . Diarrhea   . Dyspnea    with exertion  . Enlarged heart 2015  . Family history of adverse reaction to anesthesia    "my daughter can't take certain anesthesia agents" (09/08/2014)  . GERD (gastroesophageal reflux disease)   . History of blood transfusion 09/08/2014   "got hematoma after renal biopsy & HgB dropped"  . Hyperlipidemia Dx 2012  . Hypertension Dx 2012  . Renal disorder    "there is a spot on my kidney" (09/08/2014)  . Restless legs      Assessment: 67 yo male s/p cath continuing on heparin for ACS for now per Cardiology. Heparin level increased but remains low at 0.16. Hg down  to 9.8, plt chronically low (baseline 90-100s). No bleeding or issues with infusion per discussion with RN. Not on anticoagulation PTA.  Goal of Therapy:  Heparin level 0.3-0.7 units/ml Monitor platelets by anticoagulation protocol: Yes   Plan:  Increase heparin to 1650 units/hr 8h heparin level Monitor daily heparin level and CBC, s/sx bleeding F/u Cardiology plans  Elicia Lamp, PharmD, BCPS Clinical Pharmacist Clinical phone [phone] Please check AMION for all [place] contact numbers 11/26/2017 7:46 PM

## 2017-11-26 NOTE — Procedures (Signed)
   I was present at this dialysis session, have reviewed the session itself and made  appropriate changes [name] MD [place] pager [phone]   11/26/2017, 11:58 AM

## 2017-11-26 NOTE — Progress Notes (Signed)
Per HD nurse pt's scheduled HD for this evening will likely be pushed to day shift d/t new emergent case.  Will continue to monitor pt's condition and reevaluate as needed.  Henreitta Leber, RN 11/26/17

## 2017-11-26 NOTE — Progress Notes (Signed)
Waste 180 mcg of fentanyl with Norberta Keens RN at 1600.

## 2017-11-26 NOTE — Progress Notes (Signed)
  Echocardiogram 2D Echocardiogram has been performed.  Jose Garrett 11/26/2017, 8:57 AM

## 2017-11-26 NOTE — Procedures (Signed)
Extubation Procedure Note  Patient Details:   Name: Jose Garrett DOB: [birth year] MRN: [id number]   Airway Documentation:    Vent end date: 11/26/17 Vent end time: 1432   Evaluation  O2 sats: stable throughout Complications: No apparent complications Patient did tolerate procedure well. Bilateral Breath Sounds: Coarse crackles   Yes   Pt extubated to 3L [place] per MD order. Pt with positive cuff leak prior to extubation and strong non productive cough post extubation. Pt able to speak and denies SOB, no increased WOB, VS within normal limits. RT will continue to monitor.   Jesse Sans 11/26/2017, 2:45 PM

## 2017-11-26 NOTE — Progress Notes (Signed)
Progress Note  Patient Name: Jose Garrett Date of Encounter: 11/26/2017  Primary Cardiologist: Lauree Chandler, MD   Subjective   Intubated, awake  Inpatient Medications    Scheduled Meds: . chlorhexidine gluconate (MEDLINE KIT)  15 mL Mouth Rinse BID  . Chlorhexidine Gluconate Cloth  6 each Topical Q0600  . mouth rinse  15 mL Mouth Rinse 10 times per day  . sodium chloride flush  3 mL Intravenous Q12H   Continuous Infusions: . sodium chloride 10 mL/hr at 11/26/17 0751  . famotidine (PEPCID) IV Stopped (11/25/17 2351)  . fentaNYL infusion INTRAVENOUS Stopped (11/26/17 0746)  . heparin 1,100 Units/hr (11/26/17 0751)  . nitroGLYCERIN Stopped (11/25/17 1310)  . nitroGLYCERIN    . propofol (DIPRIVAN) infusion 5 mcg/kg/min (11/26/17 0751)   PRN Meds: sodium chloride, acetaminophen, fentaNYL, ipratropium-albuterol, ondansetron (ZOFRAN) IV, sodium chloride flush   Vital Signs    Vitals:   11/26/17 0730 11/26/17 0745 11/26/17 0800 11/26/17 0815  BP: 113/63 135/64 126/69 116/68  Pulse: 74 79 76 77  Resp: '16 17 18 15  '$ Temp:  (P) 98.1 F (36.7 C)    TempSrc:  (P) Axillary    SpO2: 100% 97% 96% 94%  Weight:      Height:        Intake/Output Summary (Last 24 hours) at 11/26/2017 0826 Last data filed at 11/26/2017 0751 Gross per 24 hour  Intake 537.33 ml  Output 150 ml  Net 387.33 ml   Filed Weights   11/25/17 1325 11/25/17 1713 11/26/17 0500  Weight: 92.5 kg 87.2 kg 87.1 kg    Telemetry    Sinus this am, rate 70s. Overnight sinus tachycardia noted - Personally Reviewed  ECG    Sinus, RBBB. TWI inferolateral leads. - Personally Reviewed  Physical Exam   GEN: Intubated, awake Neck: No JVD Cardiac: RRR, harsh systolic murmur noted.   Respiratory: Clear to auscultation bilaterally. GI: Soft, nontender, non-distended  MS: No edema; No deformity. Neuro:  Nonfocal  Psych: Normal affect   Labs    Chemistry Recent Labs  Lab 11/25/17 1040  11/25/17 1059  NA 140 136  K 4.0 3.8  CL 94* 96*  CO2 27  --   GLUCOSE 126* 128*  BUN 50* 47*  CREATININE 11.99* 12.10*  CALCIUM 9.8  --   PROT 7.2  --   ALBUMIN 3.9  --   AST 49*  --   ALT 20  --   ALKPHOS 55  --   BILITOT 1.5*  --   GFRNONAA 4*  --   GFRAA 4*  --   ANIONGAP 19*  --      Hematology Recent Labs  Lab 11/25/17 1040 11/25/17 1059  WBC 9.3  --   RBC 3.68*  --   HGB 11.7* 12.9*  HCT 37.3* 38.0*  MCV 101.4*  --   MCH 31.8  --   MCHC 31.4  --   RDW 15.6*  --   PLT PLATELET CLUMPS NOTED ON SMEAR, COUNT APPEARS DECREASED  --     Cardiac Enzymes Recent Labs  Lab 11/25/17 1306 11/25/17 1913 11/26/17 0030  TROPONINI 16.18* 23.24* 22.43*    Recent Labs  Lab 11/25/17 1056 11/25/17 1236  TROPIPOC 13.34* 12.79*     BNP Recent Labs  Lab 11/25/17 1040  BNP 4,098.0*     DDimer No results for input(s): DDIMER in the last 168 hours.   Radiology    Dg Chest 2 View  Result Date: 11/25/2017 CLINICAL  DATA:  Productive cough. EXAM: CHEST - 2 VIEW COMPARISON:  10/12/2017 FINDINGS: There is new mild cardiomegaly with small bilateral pleural effusions and diffuse hazy bilateral infiltrates consistent with pulmonary edema. Pulmonary vascularity is within normal limits. No bone abnormality. IMPRESSION: New bilateral pulmonary edema, new mild cardiomegaly, and new small bilateral pleural effusions, all consistent with congestive heart failure. Electronically Signed   By: [name] M.D.   On: 11/25/2017 09:46   Portable Chest X-ray  Result Date: 11/25/2017 CLINICAL DATA:  Intubated EXAM: PORTABLE CHEST 1 VIEW COMPARISON:  Earlier same day FINDINGS: Endotracheal tube tip is 5 cm above the carina. Nasogastric tube enters the abdomen. Interstitial and early alveolar pulmonary edema may be minimally improved. No worsening or new finding. IMPRESSION: Endotracheal tube tip 5 cm above the carina. Interstitial and alveolar edema, possibly minimally improved.  Electronically Signed   By: Nelson Chimes M.D.   On: 11/25/2017 16:18    Cardiac Studies   Cardiac cath 11/25/17:  Prox LAD lesion is 40% stenosed.  Ramus lesion is 40% stenosed.  Ost Cx to Prox Cx lesion is 70% stenosed.  Ost RCA to Mid RCA lesion is 100% stenosed.  LV end diastolic pressure is normal.  There is moderate aortic valve stenosis.   1. Single vessel occlusive CAD with occluded RCA. Unable to engage with a catheter.  2. Normal LVEDP 19 mm Hg 3. AV gradient of 25 mm Hg peak to peak c/w moderate Aortic stenosis.   Plan: will transfer to ICU. Ventilator management per ICU. Dialysis per Nephrology service. Will resume IV heparin 8 hours post sheath removal and continue to cycle cardiac enzymes. It is unclear at this time whether RCA occlusion is chronic or new. Given inability to engage vessel he is not a candidate for PCI. No significant obstructive disease in the LCA.   Diagnostic Diagram        Patient Profile     67 y.o. male with history of ESRD on HD, amyloidosis, aortic stenosis, HTN, HLD, GERD and chronic diastolic CHF admitted with acute respiratory failure, elevated troponin. Cardiac cath on 11/25/17 with occlusion of the RCA which could have been his event but the RCA could not be engaged. The RCA filled distally from left to right collaterals. No severe stenoses in the left coronary system. Peak troponin 23.   Assessment & Plan    1. CAD/Acute coronary syndrome: It is presumed that his RCA could be his culprit vessel for his ACS but this vessel could not be engaged in the cath lab. The distal RCA filled from left to right collaterals. Moderate non-obstructive disease in the left coronary system. Will start ASA, beta blocker and high dose statin today. Echo pending today. Will continue IV heparin for now.   2. Acute diastolic CHF: LVEF was normal by echo last month. Repeat echo pending today. He will need HD this am for volume removal  3. Aortic stenosis:  Moderate by echo last month and moderate by cath data. Will follow  4. Respiratory failure: He is intubated. PCCM following and managing vent.     For questions or updates, please contact [place] Please consult [email or web address] for contact info under        Signed, Lauree Chandler, MD  11/26/2017, 8:26 AM

## 2017-11-26 NOTE — Progress Notes (Addendum)
Jose Garrett  LKG:[id number] DOB: 15-Oct-1950 DOA: 11/25/2017 PCP: [name], MD    LOS: 1 day   Reason for Consult / Chief Complaint:  SOB  Consulting MD and date:  Beverlee Nims 11/25/17  HPI/Summary of hospital stay:   Jose Garrett is a 67 y.o. male who has PMH including but not limited to ESRD on HD MWF (last session Fri 9/13), amyloidosis (followed at [facility]), chronic diastolic heart failure, moderate AS.  He presented to [facility] 11/25/17 with fatigue and dyspnea.  In ED, he was found to have troponin of 13, ST depression in inferior leads, and required BiPAP due to increased WOB.  He was unable to lie flat due to worsening dyspnea.  He was seen by cardiology who plans to take him to cath lab emergently.  To facilitate cath, PCCM consultation was requested as it was felt that pt might benefit from intubation. 9/16[name] for cardiac catheterization.  Found single vessel occlusive coronary artery disease involving the RCA which was 100% stenosed and not amendable to PCI.  Otherwise there was noncritical stenosis involving the LAD and LCA.  He was returned to the intensive care on the ventilator on heparin drip, cardiology felt RCA likely culprit diseased vessel   9/17: Hemodynamically stable overnight.  Chest x-ray with increased edema however currently tolerating volume removal via dialysis   Subjective:  Denies pain or shortness of breath Objective   Blood pressure 109/62, pulse 80, temperature 98.1 F (36.7 C), temperature source Axillary, resp. rate 14, height 6\' 2"  (1.88 m), weight 87.1 kg, SpO2 98 %.    Vent Mode: PRVC FiO2 (%):  [40 %-100 %] 50 % Set Rate:  [14 bmp] 14 bmp Vt Set:  [600 mL] 600 mL PEEP:  [5 cmH20-8 cmH20] 5 cmH20 Plateau Pressure:  [14 cmH20-21 cmH20] 14 cmH20   Intake/Output Summary (Last 24 hours) at 11/26/2017 1055 Last data filed at 11/26/2017 1000 Gross per 24 hour  Intake 650.15 ml  Output 150 ml  Net 500.15 ml   Filed Weights   11/25/17 1713  11/26/17 0500 11/26/17 0745  Weight: 87.2 kg 87.1 kg 87.1 kg    Examination: Pleasant 67 year old male patient currently resting on pressure support ventilation HEENT normocephalic atraumatic no jugular venous distention Pulmonary: Clear to auscultation diminished bases no accessory use on pressure support of 10 PEEP of 5 Cardiac: Regular rate and rhythm without murmur rub or gallop Abdomen: Soft nontender no organomegaly positive bowel sounds Extremities: Brisk cap refill no edema Neuro: Awake, follows commands.  Consults: date of consult/date signed off & final recs:  PCCM 9/16 >   Procedures: ETT 9/16 >   Significant Diagnostic Tests: Cath lab 9/16 > completely occluded RCA.  Moderate nonobstructive disease involving left coronary syndrome.  Moderate aortic stenosis. Echo 9/17 >   Micro Data: None.  Antimicrobials:  None.   Resolved Hospital Problem list    Assessment & Plan:   NSTEMI  -Cardiac catheterization completed on 9/16.  Presuming the RCA was the culprit vessel however this was not amendable to PCI.  He has moderate nonobstructive disease involving the left coronary system -Peak troponin 23 Plan Continuing IV heparin for now Continuing aspirin Beta-blockade High-dose statin Follow-up echocardiogram Continue telemetry monitoring Further dose adjustment of medications per cardiology   Acute on chronic diastolic heart failure (echo from Aug 2019 with EF 50 - 55%, indeterminate quantification of diastolic dysfunction), HTN, HLD, amyloidosis (followed at [facility]). Aortic stenosis Plan Continue telemetry monitoring Volume removal per  HD Follow-up echocardiogram  Acute hypoxic respiratory failure -in setting of acute pulmonary edema Portable chest x-ray personally reviewed on 9/17 demonstrates endotracheal tube in satisfactory position.  Does have increased pulmonary edema when comparing prior film Plan Volume removal with dialysis Spontaneous breathing  trial following dialysis A.m. chest x-ray VAP bundle PAD protocol, changing RASS goal to 0  ESRD on HD MWF (last session Fri 11/22/17). - Nephrology consulted by cardiology. Plan Per nephrology  Intermittent fluid and electrolyte imbalance Plan Follow-up chemistries replace as indicated  Chronic anemia without evidence of bleeding Plan Trend CBC  Thrombocytopenia Plan Trend CBC   Disposition / Summary of Today's Plan 11/26/17   To cath lab emergently then admit to ICU under cardiology. Possible extubation in AM 11/26/17.  Best Practice / Goals of Care / Disposition.   DVT prophylaxis: heparin gtt. GI prophylaxis: famotidine. Diet: NPO. Mobility: Bedrest. Code Status: Full. Family Communication: Raquel Sarna updated at outside   Labs   CBC: Recent Labs  Lab 11/25/17 1040 11/25/17 1059 11/26/17 0824  WBC 9.3  --  6.5  NEUTROABS 7.6  --   --   HGB 11.7* 12.9* 9.8*  HCT 37.3* 38.0* 31.0*  MCV 101.4*  --  101.0*  PLT PLATELET CLUMPS NOTED ON SMEAR, COUNT APPEARS DECREASED  --  95*   Basic Metabolic Panel: Recent Labs  Lab 11/25/17 1040 11/25/17 1059 11/26/17 0824  NA 140 136 138  K 4.0 3.8 3.7  CL 94* 96* 94*  CO2 27  --  26  GLUCOSE 126* 128* 86  BUN 50* 47* 48*  CREATININE 11.99* 12.10* 9.49*  CALCIUM 9.8  --  8.8*  MG  --   --  2.1  PHOS  --   --  5.8*   GFR: Estimated Creatinine Clearance: 9 mL/min (A) (by C-G formula based on SCr of 9.49 mg/dL (H)). Recent Labs  Lab 11/25/17 1040 11/25/17 1058 11/26/17 0824  WBC 9.3  --  6.5  LATICACIDVEN  --  1.81  --    Liver Function Tests: Recent Labs  Lab 11/25/17 1040  AST 49*  ALT 20  ALKPHOS 55  BILITOT 1.5*  PROT 7.2  ALBUMIN 3.9   No results for input(s): LIPASE, AMYLASE in the last 168 hours. No results for input(s): AMMONIA in the last 168 hours. ABG    Component Value Date/Time   PHART 7.412 11/26/2017 0333   PCO2ART 40.6 11/26/2017 0333   PO2ART 127.0 (H) 11/26/2017 0333   HCO3 26.0  11/26/2017 0333   TCO2 27 11/26/2017 0333   O2SAT 99.0 11/26/2017 0333    Coagulation Profile: No results for input(s): INR, PROTIME in the last 168 hours. Cardiac Enzymes: Recent Labs  Lab 11/25/17 1306 11/25/17 1913 11/26/17 0030 11/26/17 0824  TROPONINI 16.18* 23.24* 22.43* 20.47*   HbA1C: Hgb A1c MFr Bld  Date/Time Value Ref Range Status  01/23/2016 11:05 PM 5.7 (H) 4.8 - 5.6 % Final    Comment:    (NOTE)         Pre-diabetes: 5.7 - 6.4         Diabetes: >6.4         Glycemic control for adults with diabetes: <7.0    CBG: Recent Labs  Lab 11/25/17 1940 11/25/17 2333 11/26/17 0343 11/26/17 0754  GLUCAP 120* 94 87 84    Erick Colace ACNP-BC [place] Pager # [phone] OR # [phone] if no answer

## 2017-11-26 NOTE — Plan of Care (Signed)
Pt extubated this shift at 1432. Tolerated well. Pt is alert and oriented. Vital signs stable. No complaints of pain.  Pt currently in bed, appears asleep. Family at bedside.  Pt had dialysis this shift as well, per dialysis nurse, pt had 3 liters out. Echo done this shift. No new changes at this time. Will continue to monitor.   Problem: Education: Goal: Knowledge of General Education information will improve Description Including pain rating scale, medication(s)/side effects and non-pharmacologic comfort measures Outcome: Progressing   Problem: Health Behavior/Discharge Planning: Goal: Ability to manage health-related needs will improve Outcome: Progressing   Problem: Clinical Measurements: Goal: Ability to maintain clinical measurements within normal limits will improve Outcome: Progressing Goal: Will remain free from infection Outcome: Progressing Goal: Diagnostic test results will improve Outcome: Progressing Goal: Respiratory complications will improve Outcome: Progressing Goal: Cardiovascular complication will be avoided Outcome: Progressing   Problem: Activity: Goal: Risk for activity intolerance will decrease Outcome: Progressing   Problem: Nutrition: Goal: Adequate nutrition will be maintained Outcome: Progressing   Problem: Coping: Goal: Level of anxiety will decrease Outcome: Progressing   Problem: Elimination: Goal: Will not experience complications related to bowel motility Outcome: Progressing Goal: Will not experience complications related to urinary retention Outcome: Progressing   Problem: Pain Managment: Goal: General experience of comfort will improve Outcome: Progressing   Problem: Safety: Goal: Ability to remain free from injury will improve Outcome: Progressing   Problem: Skin Integrity: Goal: Risk for impaired skin integrity will decrease Outcome: Progressing   Problem: Education: Goal: Ability to demonstrate management of disease  process will improve Outcome: Progressing Goal: Ability to verbalize understanding of medication therapies will improve Outcome: Progressing Goal: Individualized Educational Video(s) Outcome: Progressing   Problem: Activity: Goal: Capacity to carry out activities will improve Outcome: Progressing   Problem: Cardiac: Goal: Ability to achieve and maintain adequate cardiopulmonary perfusion will improve Outcome: Progressing

## 2017-11-27 ENCOUNTER — Inpatient Hospital Stay (HOSPITAL_COMMUNITY): Payer: Medicare Other

## 2017-11-27 LAB — CBC
HCT: 28.3 % — ABNORMAL LOW (ref 39.0–52.0)
HEMOGLOBIN: 9.1 g/dL — AB (ref 13.0–17.0)
MCH: 32 pg (ref 26.0–34.0)
MCHC: 32.2 g/dL (ref 30.0–36.0)
MCV: 99.6 fL (ref 78.0–100.0)
PLATELETS: 81 10*3/uL — AB (ref 150–400)
RBC: 2.84 MIL/uL — AB (ref 4.22–5.81)
RDW: 15.3 % (ref 11.5–15.5)
WBC: 4.9 10*3/uL (ref 4.0–10.5)

## 2017-11-27 LAB — BASIC METABOLIC PANEL
Anion gap: 17 — ABNORMAL HIGH (ref 5–15)
BUN: 44 mg/dL — ABNORMAL HIGH (ref 8–23)
CO2: 25 mmol/L (ref 22–32)
CREATININE: 8.92 mg/dL — AB (ref 0.61–1.24)
Calcium: 9.1 mg/dL (ref 8.9–10.3)
Chloride: 95 mmol/L — ABNORMAL LOW (ref 98–111)
GFR calc Af Amer: 6 mL/min — ABNORMAL LOW (ref >60)
GFR, EST NON AFRICAN AMERICAN: 5 mL/min — AB (ref >60)
Glucose, Bld: 96 mg/dL (ref 70–99)
POTASSIUM: 3.7 mmol/L (ref 3.5–5.1)
SODIUM: 137 mmol/L (ref 135–145)

## 2017-11-27 LAB — CBC WITH DIFFERENTIAL/PLATELET
Abs Immature Granulocytes: 0 10*3/uL (ref 0.0–0.1)
BASOS ABS: 0 10*3/uL (ref 0.0–0.1)
BASOS PCT: 0 %
EOS ABS: 0.1 10*3/uL (ref 0.0–0.7)
EOS PCT: 2 %
HCT: 28.6 % — ABNORMAL LOW (ref 39.0–52.0)
Hemoglobin: 9 g/dL — ABNORMAL LOW (ref 13.0–17.0)
Immature Granulocytes: 1 %
Lymphocytes Relative: 20 %
Lymphs Abs: 0.9 10*3/uL (ref 0.7–4.0)
MCH: 31.9 pg (ref 26.0–34.0)
MCHC: 31.5 g/dL (ref 30.0–36.0)
MCV: 101.4 fL — ABNORMAL HIGH (ref 78.0–100.0)
Monocytes Absolute: 0.4 10*3/uL (ref 0.1–1.0)
Monocytes Relative: 8 %
Neutro Abs: 3 10*3/uL (ref 1.7–7.7)
Neutrophils Relative %: 69 %
Platelets: 80 10*3/uL — ABNORMAL LOW (ref 150–400)
RBC: 2.82 MIL/uL — AB (ref 4.22–5.81)
RDW: 15.3 % (ref 11.5–15.5)
WBC: 4.4 10*3/uL (ref 4.0–10.5)

## 2017-11-27 LAB — HEPATITIS B SURFACE ANTIGEN: HEP B S AG: NEGATIVE

## 2017-11-27 LAB — HEPATITIS B SURFACE ANTIBODY, QUANTITATIVE: HEPATITIS B-POST: 20.3 m[IU]/mL

## 2017-11-27 LAB — HEPARIN LEVEL (UNFRACTIONATED): HEPARIN UNFRACTIONATED: 0.68 [IU]/mL (ref 0.30–0.70)

## 2017-11-27 MED ORDER — LOPERAMIDE HCL 2 MG PO CAPS
2.0000 mg | ORAL_CAPSULE | ORAL | Status: DC | PRN
  Administered 2017-11-27: 2 mg via ORAL
  Filled 2017-11-27: qty 1

## 2017-11-27 MED ORDER — PRO-STAT SUGAR FREE PO LIQD
30.0000 mL | Freq: Two times a day (BID) | ORAL | Status: DC
  Administered 2017-11-28 – 2017-11-29 (×4): 30 mL via ORAL
  Filled 2017-11-27 (×4): qty 30

## 2017-11-27 MED ORDER — NEPRO/CARBSTEADY PO LIQD
237.0000 mL | ORAL | Status: DC
  Administered 2017-11-28 – 2017-11-30 (×3): 237 mL via ORAL
  Filled 2017-11-27 (×4): qty 237

## 2017-11-27 MED ORDER — ALUM & MAG HYDROXIDE-SIMETH 200-200-20 MG/5ML PO SUSP
30.0000 mL | ORAL | Status: DC | PRN
  Administered 2017-11-27: 30 mL via ORAL
  Filled 2017-11-27: qty 30

## 2017-11-27 MED ORDER — ASPIRIN EC 81 MG PO TBEC
81.0000 mg | DELAYED_RELEASE_TABLET | Freq: Every day | ORAL | Status: DC
  Administered 2017-11-27 – 2017-11-30 (×4): 81 mg via ORAL
  Filled 2017-11-27 (×4): qty 1

## 2017-11-27 MED ORDER — CLOPIDOGREL BISULFATE 300 MG PO TABS
300.0000 mg | ORAL_TABLET | Freq: Once | ORAL | Status: AC
  Administered 2017-11-27: 300 mg via ORAL
  Filled 2017-11-27: qty 1

## 2017-11-27 MED ORDER — ATORVASTATIN CALCIUM 80 MG PO TABS
80.0000 mg | ORAL_TABLET | Freq: Every day | ORAL | Status: DC
  Administered 2017-11-27 – 2017-11-28 (×2): 80 mg via ORAL
  Filled 2017-11-27 (×3): qty 1

## 2017-11-27 MED ORDER — CLOPIDOGREL BISULFATE 75 MG PO TABS
75.0000 mg | ORAL_TABLET | Freq: Every day | ORAL | Status: DC
  Administered 2017-11-28 – 2017-11-30 (×3): 75 mg via ORAL
  Filled 2017-11-27 (×3): qty 1

## 2017-11-27 NOTE — Procedures (Signed)
   I was present at this dialysis session, have reviewed the session itself and made  appropriate changes [name] MD [place] pager [phone]   11/27/2017, 10:54 AM

## 2017-11-27 NOTE — Progress Notes (Signed)
[facility] Progress Note  Subjective: off vent now, feeling much better. CXR improved edema  Vitals:   11/27/17 0945 11/27/17 1000 11/27/17 1015 11/27/17 1030  BP: 121/90 (!) 119/59 128/74 131/64  Pulse: 75 74 78 73  Resp: (!) 21 18 20  (!) 21  Temp:      TempSrc:      SpO2: 100% 100% 100% 100%  Weight:      Height:        Inpatient medications: . aspirin EC  81 mg Oral Daily  . atorvastatin  80 mg Oral q1800  . carvedilol  3.125 mg Oral BID WC  . Chlorhexidine Gluconate Cloth  6 each Topical Q0600  . [START ON 11/28/2017] clopidogrel  75 mg Oral Daily  . heparin  3,000 Units Dialysis Once in dialysis  . mouth rinse  15 mL Mouth Rinse BID  . sodium chloride flush  3 mL Intravenous Q12H   . sodium chloride Stopped (11/26/17 2208)  . sodium chloride    . sodium chloride    . famotidine (PEPCID) IV Stopped (11/26/17 2157)   sodium chloride, sodium chloride, sodium chloride, acetaminophen, heparin, heparin, ipratropium-albuterol, lidocaine (PF), lidocaine-prilocaine, ondansetron (ZOFRAN) IV, pentafluoroprop-tetrafluoroeth, sodium chloride flush  Iron/TIBC/Ferritin/ %Sat    Component Value Date/Time   IRON 45 01/28/2016 1402   IRON 34 (L) 09/24/2014 1224   TIBC 280 01/28/2016 1402   TIBC 284 09/24/2014 1224   FERRITIN 290 12/30/2015 0918   FERRITIN 335 (H) 12/20/2015 1009   IRONPCTSAT 16 (L) 01/28/2016 1402   IRONPCTSAT 12 (L) 09/24/2014 1224   IRONPCTSAT 36 03/02/2014 1227   Home meds:  - amitriptyline 50 hs/ gabapentin 300 am +600 hs  - oxy IR prn/ oxycontin 10mg  hs/ lorazepam 1mg  prn  - velphoro ac tid/ ecasa 81/ mvi/ prns  Exam: General: in bed, stable , off the vent Lungs: Clear bilaterally to bases Heart: RRR; 3/6 systolic murmur present. Abdomen: soft ntnd no hsm or ascites Lower extremities: trace LE edema Neuro: alert, Ox 3 Access: R forearm AVF + thrill  MWF [facility] 4h   88.5kg  2/2 bath RFA AVF  Hep 3000 - Mircera 147mcg q 2 weeks -  Calcitriol 1.63mcg PO q HD - Venofer 50mg  IV weekly  Assessment/Plan: 1.  NSTEMI: Trop > 20 with ST depression. S/p LHC urgently 9/16 showed 100% occluded RCA, not able to correct. Moderate non-occlusive disease in LAD, LCx. Echo 35-50%, mult wall motion abnormalities. Per cards.  2.  Resp failure/ pulm edema:  extubated, CXR better.  3.  Volume: 5kg under dry, pulm edema resolved.  4.  ESRD: HD MWF. HD today 5.  Anemia: Hgb 9, no ESA needed for now. 6.  Metabolic bone disease: Ca ok. Phos pending. 7.  Amyloidosis  [name] MD [facility] pager [phone]   11/27/2017, 10:46 AM   Recent Labs  Lab 11/25/17 1040  11/26/17 0824 11/27/17 0433  NA 140   < > 138 137  K 4.0   < > 3.7 3.7  CL 94*   < > 94* 95*  CO2 27  --  26 25  GLUCOSE 126*   < > 86 96  BUN 50*   < > 48* 44*  CREATININE 11.99*   < > 9.49* 8.92*  CALCIUM 9.8  --  8.8* 9.1  PHOS  --   --  5.8*  --   ALBUMIN 3.9  --   --   --    < > =  values in this interval not displayed.   Recent Labs  Lab 11/25/17 1040  AST 49*  ALT 20  ALKPHOS 55  BILITOT 1.5*  PROT 7.2   Recent Labs  Lab 11/25/17 1040  11/26/17 2345 11/27/17 0433  WBC 9.3   < > 4.9 4.4  NEUTROABS 7.6  --   --  3.0  HGB 11.7*   < > 9.1* 9.0*  HCT 37.3*   < > 28.3* 28.6*  MCV 101.4*   < > 99.6 101.4*  PLT PLATELET CLUMPS NOTED ON SMEAR, COUNT APPEARS DECREASED   < > 81* 80*   < > = values in this interval not displayed.

## 2017-11-27 NOTE — Progress Notes (Signed)
Progress Note  Patient Name: Jose Garrett Date of Encounter: 11/27/2017  Primary Cardiologist: Lauree Chandler, MD   Subjective   Feeling better. No chest pain. Dyspnea improved.   Inpatient Medications    Scheduled Meds: . aspirin  81 mg Per Tube Daily  . atorvastatin  40 mg Per Tube q1800  . carvedilol  3.125 mg Oral BID WC  . Chlorhexidine Gluconate Cloth  6 each Topical Q0600  . heparin  3,000 Units Dialysis Once in dialysis  . mouth rinse  15 mL Mouth Rinse BID  . sodium chloride flush  3 mL Intravenous Q12H   Continuous Infusions: . sodium chloride Stopped (11/26/17 2208)  . sodium chloride    . sodium chloride    . famotidine (PEPCID) IV Stopped (11/26/17 2157)  . heparin 1,650 Units/hr (11/27/17 0900)  . nitroGLYCERIN Stopped (11/25/17 1310)  . nitroGLYCERIN     PRN Meds: sodium chloride, sodium chloride, sodium chloride, acetaminophen, heparin, heparin, ipratropium-albuterol, lidocaine (PF), lidocaine-prilocaine, ondansetron (ZOFRAN) IV, pentafluoroprop-tetrafluoroeth, sodium chloride flush   Vital Signs    Vitals:   11/27/17 0815 11/27/17 0830 11/27/17 0845 11/27/17 0900  BP: 116/74 128/67 122/64 126/87  Pulse: 72 71 75 76  Resp: (!) 23 (!) 21 (!) 22 18  Temp:      TempSrc:      SpO2: 99% 97% 98% 99%  Weight:      Height:        Intake/Output Summary (Last 24 hours) at 11/27/2017 0915 Last data filed at 11/27/2017 0900 Gross per 24 hour  Intake 787.25 ml  Output 2550 ml  Net -1762.75 ml   Filed Weights   11/26/17 1900 11/27/17 0500 11/27/17 0720  Weight: 84.1 kg 84 kg 83.6 kg    Telemetry    Sinus- Personally Reviewed  ECG    NSR, RBBB, diffuse TWI.  Personally Reviewed  Physical Exam   General: Well developed, well nourished, NAD  HEENT: OP clear, mucus membranes moist  SKIN: warm, dry. No rashes. Neuro: No focal deficits  Musculoskeletal: Muscle strength 5/5 all ext  Psychiatric: Mood and affect normal  Neck: No JVD, no  carotid bruits, no thyromegaly, no lymphadenopathy.  Lungs:Clear bilaterally, no wheezes, rhonci, crackles Cardiovascular: Regular rate and rhythm. Loud harsh systolic murmur.  Abdomen:Soft. Bowel sounds present. Non-tender.  Extremities: No lower extremity edema. Pulses are 2 + in the bilateral DP/PT.   Labs    Chemistry Recent Labs  Lab 11/25/17 1040 11/25/17 1059 11/26/17 0824 11/27/17 0433  NA 140 136 138 137  K 4.0 3.8 3.7 3.7  CL 94* 96* 94* 95*  CO2 27  --  26 25  GLUCOSE 126* 128* 86 96  BUN 50* 47* 48* 44*  CREATININE 11.99* 12.10* 9.49* 8.92*  CALCIUM 9.8  --  8.8* 9.1  PROT 7.2  --   --   --   ALBUMIN 3.9  --   --   --   AST 49*  --   --   --   ALT 20  --   --   --   ALKPHOS 55  --   --   --   BILITOT 1.5*  --   --   --   GFRNONAA 4*  --  5* 5*  GFRAA 4*  --  6* 6*  ANIONGAP 19*  --  18* 17*     Hematology Recent Labs  Lab 11/26/17 0824 11/26/17 2345 11/27/17 0433  WBC 6.5 4.9 4.4  RBC  3.07* 2.84* 2.82*  HGB 9.8* 9.1* 9.0*  HCT 31.0* 28.3* 28.6*  MCV 101.0* 99.6 101.4*  MCH 31.9 32.0 31.9  MCHC 31.6 32.2 31.5  RDW 15.5 15.3 15.3  PLT 95* 81* 80*    Cardiac Enzymes Recent Labs  Lab 11/25/17 1306 11/25/17 1913 11/26/17 0030 11/26/17 0824  TROPONINI 16.18* 23.24* 22.43* 20.47*    Recent Labs  Lab 11/25/17 1056 11/25/17 1236  TROPIPOC 13.34* 12.79*     BNP Recent Labs  Lab 11/25/17 1040  BNP 4,098.0*     DDimer No results for input(s): DDIMER in the last 168 hours.   Radiology    Dg Chest 2 View  Result Date: 11/25/2017 CLINICAL DATA:  Productive cough. EXAM: CHEST - 2 VIEW COMPARISON:  10/12/2017 FINDINGS: There is new mild cardiomegaly with small bilateral pleural effusions and diffuse hazy bilateral infiltrates consistent with pulmonary edema. Pulmonary vascularity is within normal limits. No bone abnormality. IMPRESSION: New bilateral pulmonary edema, new mild cardiomegaly, and new small bilateral pleural effusions, all  consistent with congestive heart failure. Electronically Signed   By: [name] M.D.   On: 11/25/2017 09:46   Portable Chest Xray  Result Date: 11/26/2017 CLINICAL DATA:  Endotracheal tube EXAM: PORTABLE CHEST 1 VIEW COMPARISON:  Yesterday FINDINGS: Endotracheal tube tip at the clavicular heads. An orogastric tube at least reaches the stomach. Diffuse interstitial opacity. Hazy density at both bases. There is cardiopericardial enlargement. IMPRESSION: 1. Stable endotracheal and orogastric tube positioning. 2. CHF pattern. Increased hazy density of the chest likely from layering pleural fluid. Electronically Signed   By: Monte Fantasia M.D.   On: 11/26/2017 09:13   Portable Chest X-ray  Result Date: 11/25/2017 CLINICAL DATA:  Intubated EXAM: PORTABLE CHEST 1 VIEW COMPARISON:  Earlier same day FINDINGS: Endotracheal tube tip is 5 cm above the carina. Nasogastric tube enters the abdomen. Interstitial and early alveolar pulmonary edema may be minimally improved. No worsening or new finding. IMPRESSION: Endotracheal tube tip 5 cm above the carina. Interstitial and alveolar edema, possibly minimally improved. Electronically Signed   By: Nelson Chimes M.D.   On: 11/25/2017 16:18    Cardiac Studies   Cardiac cath 11/25/17:  Prox LAD lesion is 40% stenosed.  Ramus lesion is 40% stenosed.  Ost Cx to Prox Cx lesion is 70% stenosed.  Ost RCA to Mid RCA lesion is 100% stenosed.  LV end diastolic pressure is normal.  There is moderate aortic valve stenosis.   1. Single vessel occlusive CAD with occluded RCA. Unable to engage with a catheter.  2. Normal LVEDP 19 mm Hg 3. AV gradient of 25 mm Hg peak to peak c/w moderate Aortic stenosis.   Plan: will transfer to ICU. Ventilator management per ICU. Dialysis per Nephrology service. Will resume IV heparin 8 hours post sheath removal and continue to cycle cardiac enzymes. It is unclear at this time whether RCA occlusion is chronic or new. Given  inability to engage vessel he is not a candidate for PCI. No significant obstructive disease in the LCA.   Diagnostic Diagram      Echo 11/26/17: - Left ventricle: The cavity size was normal. There was moderate   concentric hypertrophy. Systolic function was moderately reduced.   The estimated ejection fraction was in the range of 35% to 40%.   Hypokinesis of the anterior, anterolateral, and inferolateral   myocardium. Features are consistent with a pseudonormal left   ventricular filling pattern, with concomitant abnormal relaxation   and increased filling  pressure (grade 2 diastolic dysfunction).   Doppler parameters are consistent with high ventricular filling   pressure. - Aortic valve: Valve mobility was restricted. There was moderate   stenosis. There was mild to moderate regurgitation. Peak velocity   (S): 335 cm/s. Mean gradient (S): 28 mm Hg. - Mitral valve: Transvalvular velocity was within the normal range.   There was no evidence for stenosis. There was trivial   regurgitation. - Left atrium: The atrium was mildly dilated. - Right atrium: The atrium was normal in size. - Tricuspid valve: There was trivial regurgitation. - Pulmonary arteries: Systolic pressure was within the normal   range. PA peak pressure: 24 mm Hg (S). - Inferior vena cava: The vessel was normal in size. The   respirophasic diameter changes were blunted (< 50%), consistent   with normal central venous pressure. - Pericardium, extracardiac: A small pericardial effusion was   identified. There was a left pleural effusion.  Patient Profile     67 y.o. male with history of ESRD on HD, amyloidosis, aortic stenosis, HTN, HLD, GERD and chronic diastolic CHF admitted with acute respiratory failure, elevated troponin. Cardiac cath on 11/25/17 with occlusion of the RCA which could have been his event but the RCA could not be engaged. The RCA filled distally from left to right collaterals. No severe stenoses in  the left coronary system. Peak troponin 23.   Assessment & Plan    1. CAD/Acute coronary syndrome: Admitted 11/25/17 with respiratory distress, chest pain and elevated troponin c/w ACS. It is presumed that his RCA was his culprit vessel for his ACS but this vessel could not be engaged in the cath lab. The distal RCA filled from left to right collaterals. Moderate non-obstructive disease in the left coronary system. He is now stable and chest pain free. Respiratory status improved with volume removal through HD. Echo 11/26/17 with LVEF=35-40%.  Will continue ASA, statin and beta blocker. Will start Plavix today given ACS and stop IV heparin.    2. Acute diastolic and systolic CHF: LVEF was normal by echo last month but LV systolic function is now reduced following acute MI. LVEF is now 35-40%. Plan for HD today.   3. Aortic stenosis: Moderate by echo 11/26/17. There may be falsely low gradients due to reduced LV systolic function but his AS was moderate in August 2019 in setting of normal LV systolic function. Will follow.   4. Respiratory failure: Now extubated. Respiratory status is stable.   5. Pericardial effusion: Small effusion by echo 11/26/17. Will follow. Repeat limited echo in one week.    Probable transfer to tele this afternoon if stable.   For questions or updates, please contact [place] Please consult [email or web address] for contact info under        Signed, Lauree Chandler, MD  11/27/2017, 9:15 AM

## 2017-11-27 NOTE — Plan of Care (Signed)
  Problem: Education: Goal: Knowledge of General Education information will improve Description Including pain rating scale, medication(s)/side effects and non-pharmacologic comfort measures Outcome: Progressing   Problem: Clinical Measurements: Goal: Ability to maintain clinical measurements within normal limits will improve Outcome: Progressing Goal: Respiratory complications will improve Outcome: Progressing Goal: Cardiovascular complication will be avoided Outcome: Progressing   Problem: Activity: Goal: Risk for activity intolerance will decrease Outcome: Progressing   Problem: Coping: Goal: Level of anxiety will decrease Outcome: Progressing   Problem: Nutrition: Goal: Adequate nutrition will be maintained Outcome: Progressing   Problem: Coping: Goal: Level of anxiety will decrease Outcome: Progressing   Problem: Elimination: Goal: Will not experience complications related to urinary retention Outcome: Progressing   Problem: Pain Managment: Goal: General experience of comfort will improve Outcome: Progressing

## 2017-11-27 NOTE — Progress Notes (Signed)
Jose Garrett  FYB:[id number] DOB: January 14, 1951 DOA: 11/25/2017 PCP: [name], MD    LOS: 2 days   Reason for Consult / Chief Complaint:  SOB  Consulting MD and date:  Beverlee Nims 11/25/17  HPI/Summary of hospital stay:   Jose Garrett is a 67 y.o. male who has PMH including but not limited to ESRD on HD MWF (last session Fri 9/13), amyloidosis (followed at [facility]), chronic diastolic heart failure, moderate AS.  He presented to [facility] 11/25/17 with fatigue and dyspnea.  In ED, he was found to have troponin of 13, ST depression in inferior leads, and required BiPAP due to increased WOB.  He was unable to lie flat due to worsening dyspnea.  He was seen by cardiology who plans to take him to cath lab emergently.  To facilitate cath, PCCM consultation was requested as it was felt that pt might benefit from intubation. 9/16[name] for cardiac catheterization.  Found single vessel occlusive coronary artery disease involving the RCA which was 100% stenosed and not amendable to PCI.  Otherwise there was noncritical stenosis involving the LAD and LCA.  He was returned to the intensive care on the ventilator on heparin drip, cardiology felt RCA likely culprit diseased vessel   9/17: Hemodynamically stable overnight.  Chest x-ray with increased edema however currently tolerating volume removal via dialysis 9/18: Successfully extubated on 9/17.  Weaning oxygen  Subjective:  No shortness of breath or chest pain Objective   Blood pressure (Abnormal) 119/59, pulse 74, temperature 98.4 F (36.9 C), temperature source Oral, resp. rate 18, height 6\' 2"  (1.88 m), weight 83.6 kg, SpO2 100 %.    Vent Mode: CPAP;PSV FiO2 (%):  [40 %] 40 % PEEP:  [5 cmH20] 5 cmH20 Pressure Support:  [10 cmH20] 10 cmH20   Intake/Output Summary (Last 24 hours) at 11/27/2017 1006 Last data filed at 11/27/2017 0900 Gross per 24 hour  Intake 676.58 ml  Output 2550 ml  Net -1873.42 ml   Filed Weights   11/26/17 1900 11/27/17  0500 11/27/17 0720  Weight: 84.1 kg 84 kg 83.6 kg    Examination: General: This a pleasant 67 year old white male currently resting in bed he is on hemodialysis HEENT normocephalic atraumatic no jugular venous distention mucous membranes are moist Pulmonary: Clear to auscultation diminished bases Cardiac regular rate and rhythm no murmur rub or gallop Abdomen: Soft nontender Extremities: Warm and dry no edema Neuro: Intact  Consults: date of consult/date signed off & final recs:  PCCM 9/16 > off as of 9/18  Procedures: ETT 9/16 > 9/17  Significant Diagnostic Tests: Cath lab 9/16 > completely occluded RCA.  Moderate nonobstructive disease involving left coronary syndrome.  Moderate aortic stenosis. Echo 9/17 > left ventricular ejection fraction 35 to 40%.  New concentric hypertrophy.  Hypokinesis of the anterior, anterior lateral, and inferior lateral myocardium.  There was grade 2 diastolic dysfunction.  Micro Data: None.  Antimicrobials:  None.   Resolved Hospital Problem list    Assessment & Plan:   NSTEMI  -Cardiac catheterization completed on 9/16.  Presuming the RCA was the culprit vessel however this was not amendable to PCI.  He has moderate nonobstructive disease involving the left coronary system -Peak troponin 23 Plan Continue telemetry monitoring Per cardiology note, continuing aspirin, statin, and beta-blocker.  Starting Plavix today and stopping IV heparin   Acute on chronic diastolic heart failure and newly diagnosed acute systolic heart failure HTN, HLD, amyloidosis (followed at [facility]). Aortic stenosis Plan Continue telemetry  monitoring Volume removal per HD Medical management as above  Acute hypoxic respiratory failure -in setting of acute pulmonary edema Portable chest x-ray personally reviewed: Volume overload but overall remarkable improvement in aeration Plan Continue to wean oxygen Pulse oximetry  ESRD on HD MWF (last session Fri  11/22/17). - Nephrology consulted by cardiology. Plan Per nephrology  Intermittent fluid and electrolyte imbalance Plan Per nephrology  Chronic anemia without evidence of bleeding Plan Trend CBC  Thrombocytopenia Plan Trend CBC   Disposition / Summary of Today's Plan 11/27/17   Successfully extubated yesterday.  Looks like this was primarily acute MI complicated by acute systolic heart failure with pulmonary edema and resultant respiratory failure now hemodynamically stable.  Will wean oxygen, continue titration of cardiac medications per primary team.  Critical care will sign off  Best Practice / Goals of Care / Disposition.   DVT prophylaxis: heparin gtt. transition to subcu heparin when okay with primary service GI prophylaxis: famotidine. Diet: Heart healthy Mobility:  Mobilize Code Status: Full. Family Communication: Raquel Sarna updated at outside   Labs   CBC: Recent Labs  Lab 11/25/17 1040 11/25/17 1059 11/26/17 0824 11/26/17 2345 11/27/17 0433  WBC 9.3  --  6.5 4.9 4.4  NEUTROABS 7.6  --   --   --  3.0  HGB 11.7* 12.9* 9.8* 9.1* 9.0*  HCT 37.3* 38.0* 31.0* 28.3* 28.6*  MCV 101.4*  --  101.0* 99.6 101.4*  PLT PLATELET CLUMPS NOTED ON SMEAR, COUNT APPEARS DECREASED  --  95* 81* 80*   Basic Metabolic Panel: Recent Labs  Lab 11/25/17 1040 11/25/17 1059 11/26/17 0824 11/27/17 0433  NA 140 136 138 137  K 4.0 3.8 3.7 3.7  CL 94* 96* 94* 95*  CO2 27  --  26 25  GLUCOSE 126* 128* 86 96  BUN 50* 47* 48* 44*  CREATININE 11.99* 12.10* 9.49* 8.92*  CALCIUM 9.8  --  8.8* 9.1  MG  --   --  2.1  --   PHOS  --   --  5.8*  --    GFR: Estimated Creatinine Clearance: 9.6 mL/min (A) (by C-G formula based on SCr of 8.92 mg/dL (H)). Recent Labs  Lab 11/25/17 1040 11/25/17 1058 11/26/17 0824 11/26/17 2345 11/27/17 0433  WBC 9.3  --  6.5 4.9 4.4  LATICACIDVEN  --  1.81  --   --   --    Liver Function Tests: Recent Labs  Lab 11/25/17 1040  AST 49*  ALT 20   ALKPHOS 55  BILITOT 1.5*  PROT 7.2  ALBUMIN 3.9   No results for input(s): LIPASE, AMYLASE in the last 168 hours. No results for input(s): AMMONIA in the last 168 hours. ABG    Component Value Date/Time   PHART 7.412 11/26/2017 0333   PCO2ART 40.6 11/26/2017 0333   PO2ART 127.0 (H) 11/26/2017 0333   HCO3 26.0 11/26/2017 0333   TCO2 27 11/26/2017 0333   O2SAT 99.0 11/26/2017 0333    Coagulation Profile: No results for input(s): INR, PROTIME in the last 168 hours. Cardiac Enzymes: Recent Labs  Lab 11/25/17 1306 11/25/17 1913 11/26/17 0030 11/26/17 0824  TROPONINI 16.18* 23.24* 22.43* 20.47*   HbA1C: Hgb A1c MFr Bld  Date/Time Value Ref Range Status  01/23/2016 11:05 PM 5.7 (H) 4.8 - 5.6 % Final    Comment:    (NOTE)         Pre-diabetes: 5.7 - 6.4         Diabetes: >6.4  Glycemic control for adults with diabetes: <7.0    CBG: Recent Labs  Lab 11/25/17 1940 11/25/17 2333 11/26/17 0343 11/26/17 0754  GLUCAP 120* 94 87 84    Erick Colace ACNP-BC [facility] Pager # [phone] OR # [phone] if no answer

## 2017-11-27 NOTE — Plan of Care (Signed)
  Problem: Clinical Measurements: Goal: Ability to maintain clinical measurements within normal limits will improve Outcome: Progressing Goal: Respiratory complications will improve Outcome: Progressing Note:  Extubated and weaning nasal cannula needs. Goal: Cardiovascular complication will be avoided Outcome: Progressing   Problem: Activity: Goal: Risk for activity intolerance will decrease Outcome: Progressing   Problem: Nutrition: Goal: Adequate nutrition will be maintained Outcome: Progressing Note:  Advanced to renal diet this AM.   Problem: Pain Managment: Goal: General experience of comfort will improve Outcome: Progressing

## 2017-11-27 NOTE — Progress Notes (Signed)
Initial Nutrition Assessment  DOCUMENTATION CODES:   Not applicable  INTERVENTION:   -Nepro Shake po once daily, each supplement provides 425 kcal and 19 grams protein  -30 ml Prostat BID, each supplement provides 100 kcals and 15 grams protein.  -Provide Renal MVI daily  -Monitor Phosphorus and continue binders if necessary  NUTRITION DIAGNOSIS:   Inadequate oral intake related to nausea, poor appetite as evidenced by per patient/family report.  GOAL:   Patient will meet greater than or equal to 90% of their needs  MONITOR:   PO intake, Supplement acceptance, Weight trends, Labs, I & O's  REASON FOR ASSESSMENT:   Malnutrition Screening Tool    ASSESSMENT:   Patient with PMH significant for ESRD on HD MWF, amyloidosis (followed at [facility]), CHF, moderate AS, HLD, and HTN. Presents this admission with complaints of fatigue and dyspnea. Admitted for NSTEMI and acute respiratory failure.    9/16- urgent LHC, intubated 9/17- extubated   Pt endorses having a loss in appetite for 1 week PTA due to "not feeling well" and slight nausea. States he typically eats 3-4 meals daily that consist of good protein sources like chicken and hamburger. During this last week he could tolerate one meal that consisted of soup or 1/2 a hamburger. Pt has been instructed to take Phosphorus binders with every meal, but admits he takes them 1-2 times daily. Pt is followed by RD at his outpatient HD center. He reports they watch his phosphorus and potassium closely, and they have been great over the last month. Pt does not use protein supplementation at home but is willing to try Nepro and Prostat. RD observed pt eating vegetable lasagna without complication.   Pt endorses a EDW of 88.5 kg and he is unsure if he has lost any dry weight. He has not noticed his clothes fit differently. Records show in previous admissions his weight fluctuated between 92.6 kg (5/16) and 90.7 kg (8/3). Per Nephrology pt is 5  kg below his EDW at after his last HD treatment.   Pt still makes small amounts of urine and describes it as 1 cup (240 ml) every other day.   Medications reviewed.  Labs reviewed: Phosphorus 5.8 (H)   NUTRITION - FOCUSED PHYSICAL EXAM:    Most Recent Value  Orbital Region  No depletion  Upper Arm Region  No depletion  Thoracic and Lumbar Region  Unable to assess  Buccal Region  No depletion  Temple Region  Mild depletion  Clavicle Bone Region  No depletion  Clavicle and Acromion Bone Region  Mild depletion  Scapular Bone Region  Unable to assess  Dorsal Hand  No depletion  Patellar Region  No depletion  Anterior Thigh Region  Mild depletion  Posterior Calf Region  Mild depletion  Edema (RD Assessment)  Mild     Diet Order:   Diet Order            Diet renal with fluid restriction Fluid restriction: 1200 mL Fluid; Room service appropriate? Yes; Fluid consistency: Thin  Diet effective now              EDUCATION NEEDS:   Education needs have been addressed  Skin:  Skin Assessment: Reviewed RN Assessment  Last BM:  11/27/17  Height:   Ht Readings from Last 1 Encounters:  11/26/17 6\' 2"  (1.88 m)    Weight:   Wt Readings from Last 1 Encounters:  11/27/17 79.5 kg    Ideal Body Weight:  86.4 kg  BMI:  Body mass index is 22.5 kg/m.  Estimated Nutritional Needs:   Kcal:  2500-2700 kcal  Protein:  115-130 grams  Fluid:  1000 ml + UOP   Mariana Single RD, LDN Clinical Nutrition Pager # [phone]

## 2017-11-28 DIAGNOSIS — I5041 Acute combined systolic (congestive) and diastolic (congestive) heart failure: Secondary | ICD-10-CM

## 2017-11-28 LAB — BASIC METABOLIC PANEL
Anion gap: 16 — ABNORMAL HIGH (ref 5–15)
BUN: 26 mg/dL — AB (ref 8–23)
CO2: 24 mmol/L (ref 22–32)
Calcium: 9.4 mg/dL (ref 8.9–10.3)
Chloride: 97 mmol/L — ABNORMAL LOW (ref 98–111)
Creatinine, Ser: 5.89 mg/dL — ABNORMAL HIGH (ref 0.61–1.24)
GFR calc Af Amer: 10 mL/min — ABNORMAL LOW (ref >60)
GFR, EST NON AFRICAN AMERICAN: 9 mL/min — AB (ref >60)
GLUCOSE: 130 mg/dL — AB (ref 70–99)
POTASSIUM: 3.3 mmol/L — AB (ref 3.5–5.1)
SODIUM: 137 mmol/L (ref 135–145)

## 2017-11-28 LAB — CBC
HCT: 31.3 % — ABNORMAL LOW (ref 39.0–52.0)
Hemoglobin: 9.9 g/dL — ABNORMAL LOW (ref 13.0–17.0)
MCH: 31.5 pg (ref 26.0–34.0)
MCHC: 31.6 g/dL (ref 30.0–36.0)
MCV: 99.7 fL (ref 78.0–100.0)
PLATELETS: 107 10*3/uL — AB (ref 150–400)
RBC: 3.14 MIL/uL — ABNORMAL LOW (ref 4.22–5.81)
RDW: 15.3 % (ref 11.5–15.5)
WBC: 4.9 10*3/uL (ref 4.0–10.5)

## 2017-11-28 LAB — PHOSPHORUS: PHOSPHORUS: 3.6 mg/dL (ref 2.5–4.6)

## 2017-11-28 LAB — TRIGLYCERIDES: Triglycerides: 210 mg/dL — ABNORMAL HIGH (ref <150)

## 2017-11-28 MED ORDER — POTASSIUM CHLORIDE CRYS ER 20 MEQ PO TBCR
30.0000 meq | EXTENDED_RELEASE_TABLET | Freq: Once | ORAL | Status: AC
  Administered 2017-11-28: 30 meq via ORAL
  Filled 2017-11-28: qty 1

## 2017-11-28 MED ORDER — CHLORHEXIDINE GLUCONATE CLOTH 2 % EX PADS: 6.0000 | MEDICATED_PAD | Freq: Every day | CUTANEOUS | Status: DC

## 2017-11-28 NOTE — Plan of Care (Signed)
  Problem: Education: Goal: Knowledge of General Education information will improve Description Including pain rating scale, medication(s)/side effects and non-pharmacologic comfort measures Outcome: Progressing   Problem: Health Behavior/Discharge Planning: Goal: Ability to manage health-related needs will improve Outcome: Progressing   

## 2017-11-28 NOTE — Care Management Important Message (Signed)
Important Message  Patient Details  Name: Jose Garrett MRN: [id number] Date of Birth: [birth year]   Medicare Important Message Given:  Yes    [name] 11/28/2017, 2:59 PM

## 2017-11-28 NOTE — Progress Notes (Signed)
Progress Note  Patient Name: Jose Garrett Date of Encounter: 11/28/2017  Primary Cardiologist: Lauree Chandler, MD   Subjective   No chest pain or dyspnea.   Inpatient Medications    Scheduled Meds: . aspirin EC  81 mg Oral Daily  . atorvastatin  80 mg Oral q1800  . carvedilol  3.125 mg Oral BID WC  . Chlorhexidine Gluconate Cloth  6 each Topical Q0600  . clopidogrel  75 mg Oral Daily  . feeding supplement (NEPRO CARB STEADY)  237 mL Oral Q24H  . feeding supplement (PRO-STAT SUGAR FREE 64)  30 mL Oral BID  . heparin  3,000 Units Dialysis Once in dialysis  . mouth rinse  15 mL Mouth Rinse BID  . sodium chloride flush  3 mL Intravenous Q12H   Continuous Infusions: . sodium chloride 250 mL (11/27/17 2149)  . sodium chloride    . sodium chloride     PRN Meds: sodium chloride, sodium chloride, sodium chloride, acetaminophen, alum & mag hydroxide-simeth, heparin, heparin, ipratropium-albuterol, lidocaine (PF), lidocaine-prilocaine, loperamide, ondansetron (ZOFRAN) IV, pentafluoroprop-tetrafluoroeth, sodium chloride flush   Vital Signs    Vitals:   11/27/17 1532 11/27/17 1915 11/28/17 0031 11/28/17 0455  BP: 123/87 129/84 (!) 132/56 (!) 133/59  Pulse: 80 81 80 77  Resp:  18 18 18   Temp: 98.2 F (36.8 C) 97.7 F (36.5 C) 98.6 F (37 C) 98.1 F (36.7 C)  TempSrc: Oral Oral Oral Oral  SpO2: 94% 96% 90% 92%  Weight: 81.2 kg   86.1 kg  Height: 5\' 10"  (1.778 m)       Intake/Output Summary (Last 24 hours) at 11/28/2017 0808 Last data filed at 11/28/2017 0400 Gross per 24 hour  Intake 203.07 ml  Output 2500 ml  Net -2296.93 ml   Filed Weights   11/27/17 1129 11/27/17 1532 11/28/17 0455  Weight: 79.5 kg 81.2 kg 86.1 kg    Telemetry    sinus- Personally Reviewed  ECG     No AM EKG   Physical Exam    General: Well developed, well nourished, NAD  HEENT: OP clear, mucus membranes moist  SKIN: warm, dry. No rashes. Neuro: No focal deficits    Musculoskeletal: Muscle strength 5/5 all ext  Psychiatric: Mood and affect normal  Neck: No JVD, no carotid bruits, no thyromegaly, no lymphadenopathy.  Lungs:Clear bilaterally, no wheezes, rhonci, crackles Cardiovascular: Regular rate and rhythm. No murmurs, gallops or rubs. Abdomen:Soft. Bowel sounds present. Non-tender.  Extremities: No lower extremity edema. Pulses are 2 + in the bilateral DP/PT.   Labs    Chemistry Recent Labs  Lab 11/25/17 1040  11/26/17 0824 11/27/17 0433 11/28/17 0059  NA 140   < > 138 137 137  K 4.0   < > 3.7 3.7 3.3*  CL 94*   < > 94* 95* 97*  CO2 27  --  26 25 24   GLUCOSE 126*   < > 86 96 130*  BUN 50*   < > 48* 44* 26*  CREATININE 11.99*   < > 9.49* 8.92* 5.89*  CALCIUM 9.8  --  8.8* 9.1 9.4  PROT 7.2  --   --   --   --   ALBUMIN 3.9  --   --   --   --   AST 49*  --   --   --   --   ALT 20  --   --   --   --   ALKPHOS 55  --   --   --   --  BILITOT 1.5*  --   --   --   --   GFRNONAA 4*  --  5* 5* 9*  GFRAA 4*  --  6* 6* 10*  ANIONGAP 19*  --  18* 17* 16*   < > = values in this interval not displayed.     Hematology Recent Labs  Lab 11/26/17 2345 11/27/17 0433 11/28/17 0059  WBC 4.9 4.4 4.9  RBC 2.84* 2.82* 3.14*  HGB 9.1* 9.0* 9.9*  HCT 28.3* 28.6* 31.3*  MCV 99.6 101.4* 99.7  MCH 32.0 31.9 31.5  MCHC 32.2 31.5 31.6  RDW 15.3 15.3 15.3  PLT 81* 80* 107*    Cardiac Enzymes Recent Labs  Lab 11/25/17 1306 11/25/17 1913 11/26/17 0030 11/26/17 0824  TROPONINI 16.18* 23.24* 22.43* 20.47*    Recent Labs  Lab 11/25/17 1056 11/25/17 1236  TROPIPOC 13.34* 12.79*     BNP Recent Labs  Lab 11/25/17 1040  BNP 4,098.0*     DDimer No results for input(s): DDIMER in the last 168 hours.   Radiology    Dg Chest Port 1 View  Result Date: 11/27/2017 CLINICAL DATA:  67 year old male dialysis patient with pulmonary edema, shortness of breath EXAM: PORTABLE CHEST 1 VIEW COMPARISON:  11/26/2017 and earlier. FINDINGS: Portable  AP upright view at 0608 hours. Extubated and enteric tube removed. Regressed pulmonary vascularity since yesterday, pulmonary interstitial markings now approaching baseline. Continued dense retrocardiac opacity greater on the left with some associated left lung base veiling opacity. No pneumothorax. Stable cardiomegaly and mediastinal contours. Visualized tracheal air column is within normal limits. IMPRESSION: 1. Regressed versus resolved pulmonary edema since yesterday; pulmonary interstitium appearance approaching baseline. 2. Small left pleural effusion suspected with left greater than right lower lobe collapse or consolidation. Electronically Signed   By: Genevie Ann M.D.   On: 11/27/2017 10:26    Cardiac Studies   Cardiac cath 11/25/17:  Prox LAD lesion is 40% stenosed.  Ramus lesion is 40% stenosed.  Ost Cx to Prox Cx lesion is 70% stenosed.  Ost RCA to Mid RCA lesion is 100% stenosed.  LV end diastolic pressure is normal.  There is moderate aortic valve stenosis.   1. Single vessel occlusive CAD with occluded RCA. Unable to engage with a catheter.  2. Normal LVEDP 19 mm Hg 3. AV gradient of 25 mm Hg peak to peak c/w moderate Aortic stenosis.   Plan: will transfer to ICU. Ventilator management per ICU. Dialysis per Nephrology service. Will resume IV heparin 8 hours post sheath removal and continue to cycle cardiac enzymes. It is unclear at this time whether RCA occlusion is chronic or new. Given inability to engage vessel he is not a candidate for PCI. No significant obstructive disease in the LCA.   Diagnostic Diagram      Echo 11/26/17: - Left ventricle: The cavity size was normal. There was moderate   concentric hypertrophy. Systolic function was moderately reduced.   The estimated ejection fraction was in the range of 35% to 40%.   Hypokinesis of the anterior, anterolateral, and inferolateral   myocardium. Features are consistent with a pseudonormal left   ventricular filling  pattern, with concomitant abnormal relaxation   and increased filling pressure (grade 2 diastolic dysfunction).   Doppler parameters are consistent with high ventricular filling   pressure. - Aortic valve: Valve mobility was restricted. There was moderate   stenosis. There was mild to moderate regurgitation. Peak velocity   (S): 335 cm/s. Mean gradient (S): 28  mm Hg. - Mitral valve: Transvalvular velocity was within the normal range.   There was no evidence for stenosis. There was trivial   regurgitation. - Left atrium: The atrium was mildly dilated. - Right atrium: The atrium was normal in size. - Tricuspid valve: There was trivial regurgitation. - Pulmonary arteries: Systolic pressure was within the normal   range. PA peak pressure: 24 mm Hg (S). - Inferior vena cava: The vessel was normal in size. The   respirophasic diameter changes were blunted (< 50%), consistent   with normal central venous pressure. - Pericardium, extracardiac: A small pericardial effusion was   identified. There was a left pleural effusion.  Patient Profile     67 y.o. male with history of ESRD on HD, amyloidosis, aortic stenosis, HTN, HLD, GERD and chronic diastolic CHF admitted with acute respiratory failure, elevated troponin. Cardiac cath on 11/25/17 with occlusion of the RCA which could have been his event but the RCA could not be engaged. The RCA filled distally from left to right collaterals. No severe stenoses in the left coronary system. Peak troponin 23.   Assessment & Plan    1. CAD/Acute coronary syndrome: Admitted 11/25/17 with respiratory distress, chest pain and elevated troponin c/w ACS. It is presumed that his RCA was his culprit vessel for his ACS but this vessel could not be engaged in the cath lab. The distal RCA filled from left to right collaterals. Moderate non-obstructive disease in the left coronary system.  No chest pain this am. Resp status improved with volume removal through HD. Echo  11/26/17 with LVEF=35-40%.  Continue ASA, Plavix, beta blocker and statin.     2. Acute diastolic and systolic CHF: LVEF was normal by echo last month but LV systolic function is now reduced following acute MI. LVEF is now 35-40%. Will continue beta blocker. Can consider adding bidil if BP would tolerate for HD  3. Aortic stenosis: Moderate by echo 11/26/17. There may be falsely low gradients due to reduced LV systolic function but his AS was moderate in August 2019 in setting of normal LV systolic function.  Will continue to follow as an outpatient  4. Respiratory failure: Stable.   5. Pericardial effusion: Small effusion by echo 11/26/17. Will need follow up limited echo in one week.    Anticipate d/c home on 11/29/17 if stable  For questions or updates, please contact [place] Please consult [email or web address] for contact info under        Signed, Lauree Chandler, MD  11/28/2017, 8:08 AM

## 2017-11-28 NOTE — Progress Notes (Signed)
[facility] Progress Note  Subjective: no new c/o's. Stable, out of ICU.   Vitals:   11/27/17 1915 11/28/17 0031 11/28/17 0455 11/28/17 1100  BP: 129/84 (!) 132/56 (!) 133/59 (!) 147/74  Pulse: 81 80 77 76  Resp: 18 18 18  (!) 21  Temp: 97.7 F (36.5 C) 98.6 F (37 C) 98.1 F (36.7 C) 97.8 F (36.6 C)  TempSrc: Oral Oral Oral Oral  SpO2: 96% 90% 92% 99%  Weight:   86.1 kg   Height:        Inpatient medications: . aspirin EC  81 mg Oral Daily  . atorvastatin  80 mg Oral q1800  . carvedilol  3.125 mg Oral BID WC  . Chlorhexidine Gluconate Cloth  6 each Topical Q0600  . clopidogrel  75 mg Oral Daily  . feeding supplement (NEPRO CARB STEADY)  237 mL Oral Q24H  . feeding supplement (PRO-STAT SUGAR FREE 64)  30 mL Oral BID  . heparin  3,000 Units Dialysis Once in dialysis  . mouth rinse  15 mL Mouth Rinse BID  . sodium chloride flush  3 mL Intravenous Q12H   . sodium chloride 250 mL (11/27/17 2149)  . sodium chloride    . sodium chloride     sodium chloride, sodium chloride, sodium chloride, acetaminophen, alum & mag hydroxide-simeth, heparin, heparin, ipratropium-albuterol, lidocaine (PF), lidocaine-prilocaine, loperamide, ondansetron (ZOFRAN) IV, pentafluoroprop-tetrafluoroeth, sodium chloride flush  Iron/TIBC/Ferritin/ %Sat    Component Value Date/Time   IRON 45 01/28/2016 1402   IRON 34 (L) 09/24/2014 1224   TIBC 280 01/28/2016 1402   TIBC 284 09/24/2014 1224   FERRITIN 290 12/30/2015 0918   FERRITIN 335 (H) 12/20/2015 1009   IRONPCTSAT 16 (L) 01/28/2016 1402   IRONPCTSAT 12 (L) 09/24/2014 1224   IRONPCTSAT 36 03/02/2014 1227   Home meds:  - amitriptyline 50 hs/ gabapentin 300 am +600 hs  - oxy IR prn/ oxycontin 10mg  hs/ lorazepam 1mg  prn  - velphoro ac tid/ ecasa 81/ mvi/ prns  Exam: General: in bed, stable , off the vent Lungs: Clear bilaterally to bases Heart: RRR; 3/6 systolic murmur present. Abdomen: soft ntnd no hsm or ascites Lower  extremities: trace LE edema Neuro: alert, Ox 3 Access: R forearm AVF + thrill  MWF [facility] 4h   88.5kg  2/2 bath RFA AVF  Hep 3000 - Mircera 168mcg q 2 weeks - Calcitriol 1.71mcg PO q HD - Venofer 50mg  IV weekly  Assessment/Plan: 1.  NSTEMI: Trop > 20 with ST depression. S/p LHC urgently 9/16 showed 100% occluded RCA, not able to correct. Moderate non-occlusive disease in LAD, LCx. Echo 35-50%, mult wall motion abnormalities. Per cards.  2.  Resp failure/ pulm edema:  extubated, pulm edema resolved 3.  Volume: stable, cont lower edw as tol. Lowest here 83.5kg so far  4.  ESRD: HD MWF. HD tomorrow 5.  Anemia: Hgb 9, no ESA needed for now. 6.  Metabolic bone disease: Ca ok. Phos pending. 7.  Amyloidosis  [name] MD [facility] pager [phone]   11/28/2017, 11:14 AM   Recent Labs  Lab 11/25/17 1040  11/26/17 0824 11/27/17 0433 11/28/17 0059  NA 140   < > 138 137 137  K 4.0   < > 3.7 3.7 3.3*  CL 94*   < > 94* 95* 97*  CO2 27  --  26 25 24   GLUCOSE 126*   < > 86 96 130*  BUN 50*   < > 48* 44*  26*  CREATININE 11.99*   < > 9.49* 8.92* 5.89*  CALCIUM 9.8  --  8.8* 9.1 9.4  PHOS  --   --  5.8*  --  3.6  ALBUMIN 3.9  --   --   --   --    < > = values in this interval not displayed.   Recent Labs  Lab 11/25/17 1040  AST 49*  ALT 20  ALKPHOS 55  BILITOT 1.5*  PROT 7.2   Recent Labs  Lab 11/25/17 1040  11/27/17 0433 11/28/17 0059  WBC 9.3   < > 4.4 4.9  NEUTROABS 7.6  --  3.0  --   HGB 11.7*   < > 9.0* 9.9*  HCT 37.3*   < > 28.6* 31.3*  MCV 101.4*   < > 101.4* 99.7  PLT PLATELET CLUMPS NOTED ON SMEAR, COUNT APPEARS DECREASED   < > 80* 107*   < > = values in this interval not displayed.

## 2017-11-28 NOTE — Progress Notes (Signed)
Patient and family concerned that patient is jaundiced, RN assessed patient- he has a "tan" and a little yellow to his eyes. Patient says he developed juandice about 5 years ago when he had a bile blockage.

## 2017-11-29 DIAGNOSIS — I5043 Acute on chronic combined systolic (congestive) and diastolic (congestive) heart failure: Secondary | ICD-10-CM

## 2017-11-29 LAB — CBC
HCT: 29.5 % — ABNORMAL LOW (ref 39.0–52.0)
HEMATOCRIT: 30.5 % — AB (ref 39.0–52.0)
Hemoglobin: 9.4 g/dL — ABNORMAL LOW (ref 13.0–17.0)
Hemoglobin: 9.9 g/dL — ABNORMAL LOW (ref 13.0–17.0)
MCH: 31.9 pg (ref 26.0–34.0)
MCH: 32 pg (ref 26.0–34.0)
MCHC: 31.9 g/dL (ref 30.0–36.0)
MCHC: 32.5 g/dL (ref 30.0–36.0)
MCV: 100 fL (ref 78.0–100.0)
MCV: 98.7 fL (ref 78.0–100.0)
PLATELETS: 108 10*3/uL — AB (ref 150–400)
Platelets: 119 10*3/uL — ABNORMAL LOW (ref 150–400)
RBC: 2.95 MIL/uL — AB (ref 4.22–5.81)
RBC: 3.09 MIL/uL — AB (ref 4.22–5.81)
RDW: 15.1 % (ref 11.5–15.5)
RDW: 15.1 % (ref 11.5–15.5)
WBC: 5.7 10*3/uL (ref 4.0–10.5)
WBC: 5.3 10*3/uL (ref 4.0–10.5)

## 2017-11-29 LAB — BASIC METABOLIC PANEL
ANION GAP: 20 — AB (ref 5–15)
BUN: 58 mg/dL — ABNORMAL HIGH (ref 8–23)
CO2: 21 mmol/L — ABNORMAL LOW (ref 22–32)
Calcium: 9.8 mg/dL (ref 8.9–10.3)
Chloride: 97 mmol/L — ABNORMAL LOW (ref 98–111)
Creatinine, Ser: 8.83 mg/dL — ABNORMAL HIGH (ref 0.61–1.24)
GFR, EST AFRICAN AMERICAN: 6 mL/min — AB (ref >60)
GFR, EST NON AFRICAN AMERICAN: 6 mL/min — AB (ref >60)
GLUCOSE: 103 mg/dL — AB (ref 70–99)
POTASSIUM: 4 mmol/L (ref 3.5–5.1)
Sodium: 138 mmol/L (ref 135–145)

## 2017-11-29 MED ORDER — ISOSORB DINITRATE-HYDRALAZINE 20-37.5 MG PO TABS
1.0000 | ORAL_TABLET | Freq: Three times a day (TID) | ORAL | Status: DC
  Administered 2017-11-29 – 2017-11-30 (×3): 1 via ORAL
  Filled 2017-11-29 (×4): qty 1

## 2017-11-29 NOTE — Progress Notes (Signed)
[facility] Progress Note  Subjective: no new c/o, on HD now  Vitals:   11/29/17 0427 11/29/17 0913 11/29/17 0918 11/29/17 0930  BP: (!) 151/64 (!) 163/78 (!) 147/75 (!) 143/71  Pulse: 80 77 74 73  Resp: 18 11 (!) 21 18  Temp: 98.3 F (36.8 C) 98.2 F (36.8 C) 98.2 F (36.8 C)   TempSrc: Oral Oral Oral   SpO2: 98%     Weight: 81.5 kg 83.5 kg    Height:        Inpatient medications: . aspirin EC  81 mg Oral Daily  . atorvastatin  80 mg Oral q1800  . carvedilol  3.125 mg Oral BID WC  . Chlorhexidine Gluconate Cloth  6 each Topical Q0600  . clopidogrel  75 mg Oral Daily  . feeding supplement (NEPRO CARB STEADY)  237 mL Oral Q24H  . feeding supplement (PRO-STAT SUGAR FREE 64)  30 mL Oral BID  . heparin  3,000 Units Dialysis Once in dialysis  . isosorbide-hydrALAZINE  1 tablet Oral TID  . mouth rinse  15 mL Mouth Rinse BID  . sodium chloride flush  3 mL Intravenous Q12H   . sodium chloride 250 mL (11/27/17 2149)  . sodium chloride    . sodium chloride     sodium chloride, sodium chloride, sodium chloride, acetaminophen, alum & mag hydroxide-simeth, heparin, heparin, ipratropium-albuterol, lidocaine (PF), lidocaine-prilocaine, loperamide, ondansetron (ZOFRAN) IV, pentafluoroprop-tetrafluoroeth, sodium chloride flush  Iron/TIBC/Ferritin/ %Sat    Component Value Date/Time   IRON 45 01/28/2016 1402   IRON 34 (L) 09/24/2014 1224   TIBC 280 01/28/2016 1402   TIBC 284 09/24/2014 1224   FERRITIN 290 12/30/2015 0918   FERRITIN 335 (H) 12/20/2015 1009   IRONPCTSAT 16 (L) 01/28/2016 1402   IRONPCTSAT 12 (L) 09/24/2014 1224   IRONPCTSAT 36 03/02/2014 1227   Home meds:  - amitriptyline 50 hs/ gabapentin 300 am +600 hs  - oxy IR prn/ oxycontin 10mg  hs/ lorazepam 1mg  prn  - velphoro ac tid/ ecasa 81/ mvi/ prns  Exam: General: in bed, stable , off the vent Lungs: Clear bilaterally to bases Heart: RRR; 3/6 systolic murmur present. Abdomen: soft ntnd no hsm or  ascites Lower extremities: trace LE edema Neuro: alert, Ox 3 Access: R forearm AVF + thrill  MWF [facility] 4h   88.5kg  2/2 bath RFA AVF  Hep 3000 - Mircera 143mcg q 2 weeks - Calcitriol 1.12mcg PO q HD - Venofer 50mg  IV weekly  Assessment/Plan: 1.  NSTEMI: Trop > 20 with ST depression. S/p LHC urgently 9/16 which showed 100% occluded RCA, not able to correct. Moderate non-occlusive disease in LAD, LCx. Echo 35-40%, mult wall motion abnormalities. Per cards.  2.  Resp failure/ pulm edema: resolved 3.  Volume: stable, sig under prior dry wt by 5-6kg.   4.  ESRD: HD MWF. HD tomorrow 5.  Anemia: Hgb 9, no ESA needed for now. 6.  Metabolic bone disease: Ca ok. Phos pending. 7.  Amyloidosis 8.  HTN - per cards on coreg and bidil  [name] MD [place] pager [phone]   11/29/2017, 11:04 AM   Recent Labs  Lab 11/25/17 1040  11/26/17 0824  11/28/17 0059 11/29/17 0634  NA 140   < > 138   < > 137 138  K 4.0   < > 3.7   < > 3.3* 4.0  CL 94*   < > 94*   < > 97* 97*  CO2 27  --  26   < > 24 21*  GLUCOSE 126*   < > 86   < > 130* 103*  BUN 50*   < > 48*   < > 26* 58*  CREATININE 11.99*   < > 9.49*   < > 5.89* 8.83*  CALCIUM 9.8  --  8.8*   < > 9.4 9.8  PHOS  --   --  5.8*  --  3.6  --   ALBUMIN 3.9  --   --   --   --   --    < > = values in this interval not displayed.   Recent Labs  Lab 11/25/17 1040  AST 49*  ALT 20  ALKPHOS 55  BILITOT 1.5*  PROT 7.2   Recent Labs  Lab 11/25/17 1040  11/27/17 0433  11/28/17 2332 11/29/17 0634  WBC 9.3   < > 4.4   < > 5.7 5.3  NEUTROABS 7.6  --  3.0  --   --   --   HGB 11.7*   < > 9.0*   < > 9.4* 9.9*  HCT 37.3*   < > 28.6*   < > 29.5* 30.5*  MCV 101.4*   < > 101.4*   < > 100.0 98.7  PLT PLATELET CLUMPS NOTED ON SMEAR, COUNT APPEARS DECREASED   < > 80*   < > 108* 119*   < > = values in this interval not displayed.

## 2017-11-29 NOTE — Plan of Care (Addendum)
  Problem: Education: Goal: Knowledge of General Education information will improve Description Including pain rating scale, medication(s)/side effects and non-pharmacologic comfort measures Outcome: Progressing  Patient is able to verbalize pain and ask for medication. Education on medication was given to patient and non-pharmacologic measures as well.  Problem: Health Behavior/Discharge Planning: Goal: Ability to manage health-related needs will improve Outcome: Progressing   Problem: Activity: Goal: Risk for activity intolerance will decrease Outcome: Progressing  patient ambulated today with a family member, was not short of breath. Problem: Nutrition: Goal: Adequate nutrition will be maintained Outcome: Progressing   Problem: Coping: Goal: Level of anxiety will decrease Outcome: Progressing   Problem: Pain Managment: Goal: General experience of comfort will improve Outcome: Progressing   Problem: Safety: Goal: Ability to remain free from injury will improve Outcome: Completed/Met   Problem: Education: Goal: Ability to verbalize understanding of medication therapies will improve Outcome: Progressing

## 2017-11-29 NOTE — Progress Notes (Addendum)
Progress Note  Patient Name: Jose Garrett Date of Encounter: 11/29/2017  Primary Cardiologist: Lauree Chandler, MD   Subjective   Seen during dialysis. No chest pain or dyspnea.   Inpatient Medications    Scheduled Meds: . aspirin EC  81 mg Oral Daily  . atorvastatin  80 mg Oral q1800  . carvedilol  3.125 mg Oral BID WC  . Chlorhexidine Gluconate Cloth  6 each Topical Q0600  . clopidogrel  75 mg Oral Daily  . feeding supplement (NEPRO CARB STEADY)  237 mL Oral Q24H  . feeding supplement (PRO-STAT SUGAR FREE 64)  30 mL Oral BID  . heparin  3,000 Units Dialysis Once in dialysis  . mouth rinse  15 mL Mouth Rinse BID  . sodium chloride flush  3 mL Intravenous Q12H   Continuous Infusions: . sodium chloride 250 mL (11/27/17 2149)  . sodium chloride    . sodium chloride     PRN Meds: sodium chloride, sodium chloride, sodium chloride, acetaminophen, alum & mag hydroxide-simeth, heparin, heparin, ipratropium-albuterol, lidocaine (PF), lidocaine-prilocaine, loperamide, ondansetron (ZOFRAN) IV, pentafluoroprop-tetrafluoroeth, sodium chloride flush   Vital Signs    Vitals:   11/29/17 0427 11/29/17 0913 11/29/17 0918 11/29/17 0930  BP: (!) 151/64 (!) 163/78 (!) 147/75 (!) 143/71  Pulse: 80 77 74 73  Resp: 18 11 (!) 21 18  Temp: 98.3 F (36.8 C) 98.2 F (36.8 C) 98.2 F (36.8 C)   TempSrc: Oral Oral Oral   SpO2: 98%     Weight: 81.5 kg 83.5 kg    Height:        Intake/Output Summary (Last 24 hours) at 11/29/2017 1014 Last data filed at 11/29/2017 0500 Gross per 24 hour  Intake 705 ml  Output 0 ml  Net 705 ml   Filed Weights   11/28/17 0455 11/29/17 0427 11/29/17 0913  Weight: 86.1 kg 81.5 kg 83.5 kg    Telemetry    Not on tele currently during dialysis  ECG    N/A  Physical Exam   GEN: No acute distress.   Neck: No JVD Cardiac: RRR, 3/6 systolic murmurs, rubs, or gallops.  Respiratory: Clear to auscultation bilaterally. GI: Soft, nontender,  non-distended  MS: No edema; No deformity. R arm fistula  Neuro:  Nonfocal  Psych: Normal affect   Labs    Chemistry Recent Labs  Lab 11/25/17 1040  11/27/17 0433 11/28/17 0059 11/29/17 0634  NA 140   < > 137 137 138  K 4.0   < > 3.7 3.3* 4.0  CL 94*   < > 95* 97* 97*  CO2 27   < > 25 24 21*  GLUCOSE 126*   < > 96 130* 103*  BUN 50*   < > 44* 26* 58*  CREATININE 11.99*   < > 8.92* 5.89* 8.83*  CALCIUM 9.8   < > 9.1 9.4 9.8  PROT 7.2  --   --   --   --   ALBUMIN 3.9  --   --   --   --   AST 49*  --   --   --   --   ALT 20  --   --   --   --   ALKPHOS 55  --   --   --   --   BILITOT 1.5*  --   --   --   --   GFRNONAA 4*   < > 5* 9* 6*  GFRAA 4*   < >  6* 10* 6*  ANIONGAP 19*   < > 17* 16* 20*   < > = values in this interval not displayed.     Hematology Recent Labs  Lab 11/28/17 0059 11/28/17 2332 11/29/17 0634  WBC 4.9 5.7 5.3  RBC 3.14* 2.95* 3.09*  HGB 9.9* 9.4* 9.9*  HCT 31.3* 29.5* 30.5*  MCV 99.7 100.0 98.7  MCH 31.5 31.9 32.0  MCHC 31.6 31.9 32.5  RDW 15.3 15.1 15.1  PLT 107* 108* 119*    Cardiac Enzymes Recent Labs  Lab 11/25/17 1306 11/25/17 1913 11/26/17 0030 11/26/17 0824  TROPONINI 16.18* 23.24* 22.43* 20.47*    Recent Labs  Lab 11/25/17 1056 11/25/17 1236  TROPIPOC 13.34* 12.79*     BNP Recent Labs  Lab 11/25/17 1040  BNP 4,098.0*    Cardiac studies:   Cardiac cath 11/25/17:  Prox LAD lesion is 40% stenosed.  Ramus lesion is 40% stenosed.  Ost Cx to Prox Cx lesion is 70% stenosed.  Ost RCA to Mid RCA lesion is 100% stenosed.  LV end diastolic pressure is normal.  There is moderate aortic valve stenosis.  1. Single vessel occlusive CAD with occluded RCA. Unable to engage with a catheter.  2. Normal LVEDP 19 mm Hg 3. AV gradient of 25 mm Hg peak to peak c/w moderate Aortic stenosis.   Plan: will transfer to ICU. Ventilator management per ICU. Dialysis per Nephrology service. Will resume IV heparin 8 hours post  sheath removal and continue to cycle cardiac enzymes. It is unclear at this time whether RCA occlusion is chronic or new. Given inability to engage vessel he is not a candidate for PCI. No significant obstructive disease in the LCA.   Diagnostic Diagram      Echo 11/26/17: - Left ventricle: The cavity size was normal. There was moderate concentric hypertrophy. Systolic function was moderately reduced. The estimated ejection fraction was in the range of 35% to 40%. Hypokinesis of the anterior, anterolateral, and inferolateral myocardium. Features are consistent with a pseudonormal left ventricular filling pattern, with concomitant abnormal relaxation and increased filling pressure (grade 2 diastolic dysfunction). Doppler parameters are consistent with high ventricular filling pressure. - Aortic valve: Valve mobility was restricted. There was moderate stenosis. There was mild to moderate regurgitation. Peak velocity (S): 335 cm/s. Mean gradient (S): 28 mm Hg. - Mitral valve: Transvalvular velocity was within the normal range. There was no evidence for stenosis. There was trivial regurgitation. - Left atrium: The atrium was mildly dilated. - Right atrium: The atrium was normal in size. - Tricuspid valve: There was trivial regurgitation. - Pulmonary arteries: Systolic pressure was within the normal range. PA peak pressure: 24 mm Hg (S). - Inferior vena cava: The vessel was normal in size. The respirophasic diameter changes were blunted (<50%), consistent with normal central venous pressure. - Pericardium, extracardiac: A small pericardial effusion was identified. There was a left pleural effusion.  Patient Profile     67 y.o. male with history of ESRD on HD, amyloidosis, aortic stenosis, HTN, HLD, GERD and chronic diastolic CHF admitted with acute respiratory failure, elevated troponin.   Cardiac cath on 11/25/17 with occlusion of the RCA which  could have been his event but the RCA could not be engaged. The RCA filled distally from left to right collaterals. No severe stenoses in the left coronary system. Peak troponin 23.   Assessment & Plan    1. CAD/Acute coronary syndrome: Admitted 11/25/17 with respiratory distress, chest pain and elevated troponin c/w  ACS. It is presumed that his RCA was his culprit vessel for his ACS but this vessel could not be engaged in the cath lab. The distal RCA filled from left to right collaterals. Moderate non-obstructive disease in the left coronary system.  Resp status improved with volume removal through HD.  - Echo 11/26/17 with LVEF=35-40%.  - Continue ASA, Plavix, beta blocker and statin.    - No recurrent chest pain.   2. Acute diastolic and systolic CHF: LVEF was normal by echo last month but LV systolic function is now reduced following acute MI. LVEF is now 35-40%. Volume managed by dialysis. Will continue beta blocker. Will plan to add bidil if BP would tolerate following dialysis today.   3. Aortic stenosis: Moderate by echo 11/26/17. There may be falsely low gradients due to reduced LV systolic function but his AS was moderate in August 2019 in setting of normal LV systolic function.  Follow with yearly echo as outpatient   4. Pericardial effusion: Small effusion by echo 11/26/17. Will need follow up limited echo in one week.    For questions or updates, please contact [place] Please consult [email or web address] for contact info under        Signed, Leanor Kail, PA  11/29/2017, 10:14 AM    I have personally seen and examined this patient. I agree with the assessment and plan as outlined above.  He is doing well this am. No chest pain. BP stable. Will continue ASA, Plavix, statin and beta blocker.  Will add Bidil today. Will see if his BP tolerates this today.  Follow up limited echo one week.  Plan discharge Saturday AM if stable.   Lauree Chandler 11/29/2017 10:33  AM

## 2017-11-29 NOTE — Progress Notes (Signed)
Patient was transfered in bed to Hemodialysis.  Patient complaint of no pain, medication held per order pre-procedure.

## 2017-11-29 NOTE — Progress Notes (Signed)
Patient was having bad leg cramps and pain after Hemodialysis. Patient rated 7/10. PRN tylenol was given  Patient stated was feeling better after.

## 2017-11-30 ENCOUNTER — Other Ambulatory Visit: Payer: Self-pay | Admitting: Cardiology

## 2017-11-30 ENCOUNTER — Other Ambulatory Visit: Payer: Self-pay | Admitting: Physician Assistant

## 2017-11-30 DIAGNOSIS — I5032 Chronic diastolic (congestive) heart failure: Secondary | ICD-10-CM

## 2017-11-30 DIAGNOSIS — I313 Pericardial effusion (noninflammatory): Secondary | ICD-10-CM

## 2017-11-30 DIAGNOSIS — I3139 Other pericardial effusion (noninflammatory): Secondary | ICD-10-CM

## 2017-11-30 DIAGNOSIS — I3131 Malignant pericardial effusion in diseases classified elsewhere: Secondary | ICD-10-CM

## 2017-11-30 DIAGNOSIS — I5043 Acute on chronic combined systolic (congestive) and diastolic (congestive) heart failure: Secondary | ICD-10-CM

## 2017-11-30 DIAGNOSIS — C801 Malignant (primary) neoplasm, unspecified: Secondary | ICD-10-CM

## 2017-11-30 LAB — CBC
HEMATOCRIT: 29.8 % — AB (ref 39.0–52.0)
HEMOGLOBIN: 9.6 g/dL — AB (ref 13.0–17.0)
MCH: 31.9 pg (ref 26.0–34.0)
MCHC: 32.2 g/dL (ref 30.0–36.0)
MCV: 99 fL (ref 78.0–100.0)
Platelets: 124 10*3/uL — ABNORMAL LOW (ref 150–400)
RBC: 3.01 MIL/uL — AB (ref 4.22–5.81)
RDW: 15 % (ref 11.5–15.5)
WBC: 5.5 10*3/uL (ref 4.0–10.5)

## 2017-11-30 MED ORDER — ASPIRIN EC 81 MG PO TBEC: 81.0000 mg | DELAYED_RELEASE_TABLET | Freq: Every day | ORAL | 11 refills | Status: DC

## 2017-11-30 MED ORDER — CLOPIDOGREL BISULFATE 75 MG PO TABS: 75.0000 mg | ORAL_TABLET | Freq: Every day | ORAL | 3 refills | Status: DC

## 2017-11-30 MED ORDER — ISOSORB DINITRATE-HYDRALAZINE 20-37.5 MG PO TABS: 1.0000 | ORAL_TABLET | Freq: Three times a day (TID) | ORAL | 3 refills | Status: DC

## 2017-11-30 MED ORDER — ATORVASTATIN CALCIUM 80 MG PO TABS: 80.0000 mg | ORAL_TABLET | Freq: Every day | ORAL | 3 refills | Status: DC

## 2017-11-30 MED ORDER — CARVEDILOL 3.125 MG PO TABS: 3.1250 mg | ORAL_TABLET | Freq: Two times a day (BID) | ORAL | 3 refills | Status: DC

## 2017-11-30 NOTE — Progress Notes (Signed)
Echo limited for effusion

## 2017-11-30 NOTE — Progress Notes (Signed)
Pt IV discontinued, cathter intact and telemetry removed. Pt has all belongings. Pt called daughter for transportation. Pt discharge education provided at bedside, awaiting ride

## 2017-11-30 NOTE — Progress Notes (Signed)
Progress Note  Patient Name: Jose Garrett Date of Encounter: 11/30/2017  Primary Cardiologist: Lauree Chandler, MD   Subjective   Well without chest pain.  Minimal shortness of breath with exertion.  Overall he is ready to return home.  Inpatient Medications    Scheduled Meds: . aspirin EC  81 mg Oral Daily  . atorvastatin  80 mg Oral q1800  . carvedilol  3.125 mg Oral BID WC  . Chlorhexidine Gluconate Cloth  6 each Topical Q0600  . clopidogrel  75 mg Oral Daily  . feeding supplement (NEPRO CARB STEADY)  237 mL Oral Q24H  . feeding supplement (PRO-STAT SUGAR FREE 64)  30 mL Oral BID  . heparin  3,000 Units Dialysis Once in dialysis  . isosorbide-hydrALAZINE  1 tablet Oral TID  . mouth rinse  15 mL Mouth Rinse BID  . sodium chloride flush  3 mL Intravenous Q12H   Continuous Infusions: . sodium chloride 250 mL (11/27/17 2149)  . sodium chloride    . sodium chloride     PRN Meds: sodium chloride, sodium chloride, sodium chloride, acetaminophen, alum & mag hydroxide-simeth, heparin, heparin, ipratropium-albuterol, lidocaine (PF), lidocaine-prilocaine, loperamide, ondansetron (ZOFRAN) IV, pentafluoroprop-tetrafluoroeth, sodium chloride flush   Vital Signs    Vitals:   11/29/17 1954 11/30/17 0305 11/30/17 0400 11/30/17 0808  BP: 131/63  (!) 135/52 (!) 141/95  Pulse: 75  71 76  Resp: 18  18   Temp: 98.1 F (36.7 C)  98.3 F (36.8 C)   TempSrc: Oral  Oral   SpO2: 97%  98%   Weight:  81.9 kg    Height:        Intake/Output Summary (Last 24 hours) at 11/30/2017 0911 Last data filed at 11/29/2017 2200 Gross per 24 hour  Intake 597 ml  Output 1000 ml  Net -403 ml   Filed Weights   11/29/17 0913 11/29/17 1318 11/30/17 0305  Weight: 83.5 kg 82.5 kg 81.9 kg    Telemetry    Sinus rhythm, personally reviewed  ECG    None new  Physical Exam   GEN: Well nourished, well developed, in no acute distress  HEENT: normal  Neck: no JVD, carotid bruits, or  masses Cardiac: RRR; 2 out of 6 systolic murmur, no rubs, or gallops,no edema  Respiratory:  clear to auscultation bilaterally, normal work of breathing GI: soft, nontender, nondistended, + BS MS: no deformity or atrophy  Skin: warm and dry Neuro:  Strength and sensation are intact Psych: euthymic mood, full affect   Labs    Chemistry Recent Labs  Lab 11/25/17 1040  11/27/17 0433 11/28/17 0059 11/29/17 0634  NA 140   < > 137 137 138  K 4.0   < > 3.7 3.3* 4.0  CL 94*   < > 95* 97* 97*  CO2 27   < > 25 24 21*  GLUCOSE 126*   < > 96 130* 103*  BUN 50*   < > 44* 26* 58*  CREATININE 11.99*   < > 8.92* 5.89* 8.83*  CALCIUM 9.8   < > 9.1 9.4 9.8  PROT 7.2  --   --   --   --   ALBUMIN 3.9  --   --   --   --   AST 49*  --   --   --   --   ALT 20  --   --   --   --   ALKPHOS 55  --   --   --   --  BILITOT 1.5*  --   --   --   --   GFRNONAA 4*   < > 5* 9* 6*  GFRAA 4*   < > 6* 10* 6*  ANIONGAP 19*   < > 17* 16* 20*   < > = values in this interval not displayed.     Hematology Recent Labs  Lab 11/28/17 2332 11/29/17 0634 11/30/17 0530  WBC 5.7 5.3 5.5  RBC 2.95* 3.09* 3.01*  HGB 9.4* 9.9* 9.6*  HCT 29.5* 30.5* 29.8*  MCV 100.0 98.7 99.0  MCH 31.9 32.0 31.9  MCHC 31.9 32.5 32.2  RDW 15.1 15.1 15.0  PLT 108* 119* 124*    Cardiac Enzymes Recent Labs  Lab 11/25/17 1306 11/25/17 1913 11/26/17 0030 11/26/17 0824  TROPONINI 16.18* 23.24* 22.43* 20.47*    Recent Labs  Lab 11/25/17 1056 11/25/17 1236  TROPIPOC 13.34* 12.79*     BNP Recent Labs  Lab 11/25/17 1040  BNP 4,098.0*    Cardiac studies:   Cardiac cath 11/25/17:  Prox LAD lesion is 40% stenosed.  Ramus lesion is 40% stenosed.  Ost Cx to Prox Cx lesion is 70% stenosed.  Ost RCA to Mid RCA lesion is 100% stenosed.  LV end diastolic pressure is normal.  There is moderate aortic valve stenosis.  1. Single vessel occlusive CAD with occluded RCA. Unable to engage with a catheter.  2. Normal  LVEDP 19 mm Hg 3. AV gradient of 25 mm Hg peak to peak c/w moderate Aortic stenosis.   Plan: Jose Garrett transfer to ICU. Ventilator management per ICU. Dialysis per Nephrology service. Jose Garrett resume IV heparin 8 hours post sheath removal and continue to cycle cardiac enzymes. It is unclear at this time whether RCA occlusion is chronic or new. Given inability to engage vessel he is not a candidate for PCI. No significant obstructive disease in the LCA.   Diagnostic Diagram      Echo 11/26/17: - Left ventricle: The cavity size was normal. There was moderate concentric hypertrophy. Systolic function was moderately reduced. The estimated ejection fraction was in the range of 35% to 40%. Hypokinesis of the anterior, anterolateral, and inferolateral myocardium. Features are consistent with a pseudonormal left ventricular filling pattern, with concomitant abnormal relaxation and increased filling pressure (grade 2 diastolic dysfunction). Doppler parameters are consistent with high ventricular filling pressure. - Aortic valve: Valve mobility was restricted. There was moderate stenosis. There was mild to moderate regurgitation. Peak velocity (S): 335 cm/s. Mean gradient (S): 28 mm Hg. - Mitral valve: Transvalvular velocity was within the normal range. There was no evidence for stenosis. There was trivial regurgitation. - Left atrium: The atrium was mildly dilated. - Right atrium: The atrium was normal in size. - Tricuspid valve: There was trivial regurgitation. - Pulmonary arteries: Systolic pressure was within the normal range. PA peak pressure: 24 mm Hg (S). - Inferior vena cava: The vessel was normal in size. The respirophasic diameter changes were blunted (<50%), consistent with normal central venous pressure. - Pericardium, extracardiac: A small pericardial effusion was identified. There was a left pleural effusion.  Patient Profile     67 y.o. male with  history of ESRD on HD, amyloidosis, aortic stenosis, HTN, HLD, GERD and chronic diastolic CHF admitted with acute respiratory failure, elevated troponin.   Cardiac cath on 11/25/17 with occlusion of the RCA which could have been his event but the RCA could not be engaged. The RCA filled distally from left to right collaterals. No severe stenoses  in the left coronary system. Peak troponin 23.   Assessment & Plan    1. CAD/Acute coronary syndrome: Admitted 11/25/2017 with respiratory distress, chest pain, L and elevated troponin consistent with ACS.  During catheterization, RCA could not be engaged but had left to right collaterals.  Echo showed an ejection fraction of 35 to 40%.  Continue aspirin, Plavix, beta-blocker, statin.  No current chest pain.  We Jose Garrett plan for discharge today with follow-up in clinic.   2. Acute diastolic and systolic CHF: Ejection fraction now 35 to 40%, was normal last month.  Currently on beta-blocker and BiDil.  No changes.  Tolerating well.  3. Aortic stenosis: Moderate as poor most recent echo.  Plan for outpatient follow-up echo in 1 year.  4. Pericardial effusion: Plan for echo in 1 week.  Was small on most recent echo.  For questions or updates, please contact Jose Garrett Please consult [email or web address] for contact info under        Signed, Jose Garrett Jose Leeds, MD  11/30/2017, 9:11 AM    I have personally seen and examined this patient. I agree with the assessment and plan as outlined above.  He is doing well this am. No chest pain. BP stable. Jose Garrett continue ASA, Plavix, statin and beta blocker.  Jose Garrett add Bidil today. Jose Garrett see if his BP tolerates this today.  Follow up limited echo one week.  Plan discharge Saturday AM if stable.   Jose Garrett Jose Garrett 11/30/2017 9:11 AM

## 2017-11-30 NOTE — Progress Notes (Signed)
[facility] Progress Note  Subjective: no new c/o seen in room  Vitals:   11/29/17 1954 11/30/17 0305 11/30/17 0400 11/30/17 0808  BP: 131/63  (!) 135/52 (!) 141/95  Pulse: 75  71 76  Resp: 18  18   Temp: 98.1 F (36.7 C)  98.3 F (36.8 C)   TempSrc: Oral  Oral   SpO2: 97%  98%   Weight:  81.9 kg    Height:        Inpatient medications: . aspirin EC  81 mg Oral Daily  . atorvastatin  80 mg Oral q1800  . carvedilol  3.125 mg Oral BID WC  . Chlorhexidine Gluconate Cloth  6 each Topical Q0600  . clopidogrel  75 mg Oral Daily  . feeding supplement (NEPRO CARB STEADY)  237 mL Oral Q24H  . feeding supplement (PRO-STAT SUGAR FREE 64)  30 mL Oral BID  . heparin  3,000 Units Dialysis Once in dialysis  . isosorbide-hydrALAZINE  1 tablet Oral TID  . mouth rinse  15 mL Mouth Rinse BID  . sodium chloride flush  3 mL Intravenous Q12H   . sodium chloride 250 mL (11/27/17 2149)  . sodium chloride    . sodium chloride     sodium chloride, sodium chloride, sodium chloride, acetaminophen, alum & mag hydroxide-simeth, heparin, heparin, ipratropium-albuterol, lidocaine (PF), lidocaine-prilocaine, loperamide, ondansetron (ZOFRAN) IV, pentafluoroprop-tetrafluoroeth, sodium chloride flush  Iron/TIBC/Ferritin/ %Sat    Component Value Date/Time   IRON 45 01/28/2016 1402   IRON 34 (L) 09/24/2014 1224   TIBC 280 01/28/2016 1402   TIBC 284 09/24/2014 1224   FERRITIN 290 12/30/2015 0918   FERRITIN 335 (H) 12/20/2015 1009   IRONPCTSAT 16 (L) 01/28/2016 1402   IRONPCTSAT 12 (L) 09/24/2014 1224   IRONPCTSAT 36 03/02/2014 1227   Home meds:  - amitriptyline 50 hs/ gabapentin 300 am +600 hs  - oxy IR prn/ oxycontin 10mg  hs/ lorazepam 1mg  prn  - velphoro ac tid/ ecasa 81/ mvi/ prns  Exam: General: in bed, stable , off the vent Lungs: Clear bilaterally to bases Heart: RRR; 3/6 systolic murmur present. Abdomen: soft ntnd no hsm or ascites Lower extremities: trace LE edema Neuro:  alert, Ox 3 Access: R forearm AVF + thrill  MWF [facility] 4h   88.5kg  2/2 bath RFA AVF  Hep 3000 - Mircera 158mcg q 2 weeks - Calcitriol 1.41mcg PO q HD - Venofer 50mg  IV weekly  Assessment/Plan: 1. ESRD: HD MWF. HD tomorrow 2. NSTEMI: Trop > 20 with ST depression. S/p LHC urgently 9/16 which showed 100% occluded RCA, not able to correct. Moderate non-occlusive disease in LAD, LCx. Echo 35-40%, w/ mult wall motion abnormalities. Per cards.  3. Resp failure/ volume/ pulm edema: resolved, wt's down significantly from prior dry wt. Will establish new edw at dc.  euvolemic now.  4.  Anemia: Hgb 9, no ESA needed for now. 5.  Metabolic bone disease: Ca ok. Phos pending. 6.  Amyloidosis 7.  HTN - per cards on coreg and bidil  [name] MD [place] pager [phone]   11/30/2017, 10:47 AM   Recent Labs  Lab 11/25/17 1040  11/26/17 0824  11/28/17 0059 11/29/17 0634  NA 140   < > 138   < > 137 138  K 4.0   < > 3.7   < > 3.3* 4.0  CL 94*   < > 94*   < > 97* 97*  CO2 27  --  26   < >  24 21*  GLUCOSE 126*   < > 86   < > 130* 103*  BUN 50*   < > 48*   < > 26* 58*  CREATININE 11.99*   < > 9.49*   < > 5.89* 8.83*  CALCIUM 9.8  --  8.8*   < > 9.4 9.8  PHOS  --   --  5.8*  --  3.6  --   ALBUMIN 3.9  --   --   --   --   --    < > = values in this interval not displayed.   Recent Labs  Lab 11/25/17 1040  AST 49*  ALT 20  ALKPHOS 55  BILITOT 1.5*  PROT 7.2   Recent Labs  Lab 11/25/17 1040  11/27/17 0433  11/29/17 0634 11/30/17 0530  WBC 9.3   < > 4.4   < > 5.3 5.5  NEUTROABS 7.6  --  3.0  --   --   --   HGB 11.7*   < > 9.0*   < > 9.9* 9.6*  HCT 37.3*   < > 28.6*   < > 30.5* 29.8*  MCV 101.4*   < > 101.4*   < > 98.7 99.0  PLT PLATELET CLUMPS NOTED ON SMEAR, COUNT APPEARS DECREASED   < > 80*   < > 119* 124*   < > = values in this interval not displayed.

## 2017-11-30 NOTE — Progress Notes (Signed)
Pt discharged via wheelchair with NT and family

## 2017-11-30 NOTE — Discharge Summary (Addendum)
Discharge Summary    Patient ID: Jose Garrett MRN: [id number]; DOB: 08-Oct-1950  Admit date: 11/25/2017 Discharge date: 11/30/2017  Primary Care Provider: [name], MD  Primary Cardiologist: Lauree Chandler, MD  Primary Electrophysiologist:  None   Discharge Diagnoses    Principal Problem:   NSTEMI (non-ST elevated myocardial infarction) [facility]) Active Problems:   Essential hypertension, benign   Hypertriglyceridemia   AL amyloidosis (HCC)   Elevated troponin   Respiratory failure (HCC)   Acute on chronic combined systolic and diastolic CHF (congestive heart failure) (HCC)   Allergies Allergies  Allergen Reactions  . No Known Allergies     Diagnostic Studies/Procedures    Cardiac cath 11/25/17:  Prox LAD lesion is 40% stenosed.  Ramus lesion is 40% stenosed.  Ost Cx to Prox Cx lesion is 70% stenosed.  Ost RCA to Mid RCA lesion is 100% stenosed.  LV end diastolic pressure is normal.  There is moderate aortic valve stenosis.  1. Single vessel occlusive CAD with occluded RCA. Unable to engage with a catheter.  2. Normal LVEDP 19 mm Hg 3. AV gradient of 25 mm Hg peak to peak c/w moderate Aortic stenosis.   Plan: Maicee Ullman transfer to ICU. Ventilator management per ICU. Dialysis per Nephrology service. Errin Chewning resume IV heparin 8 hours post sheath removal and continue to cycle cardiac enzymes. It is unclear at this time whether RCA occlusion is chronic or new. Given inability to engage vessel he is not a candidate for PCI. No significant obstructive disease in the LCA.   Diagnostic Diagram       Echo 11/26/17: - Left ventricle: The cavity size was normal. There was moderate concentric hypertrophy. Systolic function was moderately reduced. The estimated ejection fraction was in the range of 35% to 40%. Hypokinesis of the anterior, anterolateral, and inferolateral myocardium. Features are consistent with a pseudonormal left ventricular filling  pattern, with concomitant abnormal relaxation and increased filling pressure (grade 2 diastolic dysfunction). Doppler parameters are consistent with high ventricular filling pressure. - Aortic valve: Valve mobility was restricted. There was moderate stenosis. There was mild to moderate regurgitation. Peak velocity (S): 335 cm/s. Mean gradient (S): 28 mm Hg. - Mitral valve: Transvalvular velocity was within the normal range. There was no evidence for stenosis. There was trivial regurgitation. - Left atrium: The atrium was mildly dilated. - Right atrium: The atrium was normal in size. - Tricuspid valve: There was trivial regurgitation. - Pulmonary arteries: Systolic pressure was within the normal range. PA peak pressure: 24 mm Hg (S). - Inferior vena cava: The vessel was normal in size. The respirophasic diameter changes were blunted (<50%), consistent with normal central venous pressure. - Pericardium, extracardiac: A small pericardial effusion was identified. There was a left pleural effusion.   History of Present Illness     [name] does not currently follow with cardiology. He had an echo 10/15/17 - LVEF 50-55%, moderate AS, and no wall motion abnormality. He has impaired diastolic dysfunction, but could not quantify. He presented to [facility] with ongoing fatigue since Saturday. His last HD session was on Friday (MWF HD). He was unable to start HD this morning because he was told he was at his dry weight, which is unusual for him.   On initial interview, he stated he has been excessively tired and slept frequently Sat and Sunday. He reported to HD and they asked him if he wanted to be seen in the ER and he did. On arrival, he was hemodynamically stable. First  POC troponin over 13. However, he became acutely short of breath requiring BiPAP due to increased work of breathing and hypoxia. He also developed hypertensive urgency with systolic pressure greater than 200,  started on nitro drip. Pressure now systolic [id number]. He denies chest pain, but states he can't say for sure.  EDP did not start heparin due to some mild hemoptysis with cough prior to BiPAP.   Hospital Course     Consultants:   CAD, ACS, NSTEMI Cardiology was consulted for admission. He was intubated for respiratory support and so that he was able to lay flat for heart catheterization and he was heparinized. He was taken to heart cath urgently and found to have single vessel occlusive CAD with occluded ost-mid RCA (100% stenosis). Unable to engage RCA with catheter. He also has ost Cx to proximal Cx with 70% stenosis. Continue ASA, plavix, BB, and statin. Added bidil.   Moderate to severe AS May consider TAVR in the future. Moderate AS by cath.   Acute onset systolic and diastolic heart failure Ischemic cardiomyopathy Previously normal by echo. Now with EF of 35-40% with grade 2 DD. Intubated for respiratory support. He was extubated and doing well after volume removal by HD. Volume Jose Garrett be managed by by HD/nephrology.    Pericardial effusion Small effusion by echo 11/26/17.  Follow up echo in 1 week.   ESRD on HD Per nephrology. They Jose Garrett arrange follow up.   Pt seen and examined by Dr. Curt Bears and determined stable for discharge. Follow up with be arranged. Medications as above.   _____________  Discharge Vitals Blood pressure (!) 141/95, pulse 76, temperature 98.3 F (36.8 C), temperature source Oral, resp. rate 18, height 5\' 10"  (1.778 m), weight 81.9 kg, SpO2 98 %.  Filed Weights   11/29/17 0913 11/29/17 1318 11/30/17 0305  Weight: 83.5 kg 82.5 kg 81.9 kg    Labs & Radiologic Studies    CBC Recent Labs    11/29/17 0634 11/30/17 0530  WBC 5.3 5.5  HGB 9.9* 9.6*  HCT 30.5* 29.8*  MCV 98.7 99.0  PLT 119* 169*   Basic Metabolic Panel Recent Labs    11/28/17 0059 11/29/17 0634  NA 137 138  K 3.3* 4.0  CL 97* 97*  CO2 24 21*  GLUCOSE 130* 103*  BUN 26* 58*   CREATININE 5.89* 8.83*  CALCIUM 9.4 9.8  PHOS 3.6  --    Liver Function Tests No results for input(s): AST, ALT, ALKPHOS, BILITOT, PROT, ALBUMIN in the last 72 hours. No results for input(s): LIPASE, AMYLASE in the last 72 hours. Cardiac Enzymes No results for input(s): CKTOTAL, CKMB, CKMBINDEX, TROPONINI in the last 72 hours. BNP Invalid input(s): POCBNP D-Dimer No results for input(s): DDIMER in the last 72 hours. Hemoglobin A1C No results for input(s): HGBA1C in the last 72 hours. Fasting Lipid Panel Recent Labs    11/28/17 1352  TRIG 210*   Thyroid Function Tests No results for input(s): TSH, T4TOTAL, T3FREE, THYROIDAB in the last 72 hours.  Invalid input(s): FREET3 _____________  Dg Chest 2 View  Result Date: 11/25/2017 CLINICAL DATA:  Productive cough. EXAM: CHEST - 2 VIEW COMPARISON:  10/12/2017 FINDINGS: There is new mild cardiomegaly with small bilateral pleural effusions and diffuse hazy bilateral infiltrates consistent with pulmonary edema. Pulmonary vascularity is within normal limits. No bone abnormality. IMPRESSION: New bilateral pulmonary edema, new mild cardiomegaly, and new small bilateral pleural effusions, all consistent with congestive heart failure. Electronically Signed   By: Jeneen Rinks  Maxwell M.D.   On: 11/25/2017 09:46   Dg Chest Port 1 View  Result Date: 11/27/2017 CLINICAL DATA:  67 year old male dialysis patient with pulmonary edema, shortness of breath EXAM: PORTABLE CHEST 1 VIEW COMPARISON:  11/26/2017 and earlier. FINDINGS: Portable AP upright view at 0608 hours. Extubated and enteric tube removed. Regressed pulmonary vascularity since yesterday, pulmonary interstitial markings now approaching baseline. Continued dense retrocardiac opacity greater on the left with some associated left lung base veiling opacity. No pneumothorax. Stable cardiomegaly and mediastinal contours. Visualized tracheal air column is within normal limits. IMPRESSION: 1. Regressed  versus resolved pulmonary edema since yesterday; pulmonary interstitium appearance approaching baseline. 2. Small left pleural effusion suspected with left greater than right lower lobe collapse or consolidation. Electronically Signed   By: Genevie Ann M.D.   On: 11/27/2017 10:26   Portable Chest Xray  Result Date: 11/26/2017 CLINICAL DATA:  Endotracheal tube EXAM: PORTABLE CHEST 1 VIEW COMPARISON:  Yesterday FINDINGS: Endotracheal tube tip at the clavicular heads. An orogastric tube at least reaches the stomach. Diffuse interstitial opacity. Hazy density at both bases. There is cardiopericardial enlargement. IMPRESSION: 1. Stable endotracheal and orogastric tube positioning. 2. CHF pattern. Increased hazy density of the chest likely from layering pleural fluid. Electronically Signed   By: Monte Fantasia M.D.   On: 11/26/2017 09:13   Portable Chest X-ray  Result Date: 11/25/2017 CLINICAL DATA:  Intubated EXAM: PORTABLE CHEST 1 VIEW COMPARISON:  Earlier same day FINDINGS: Endotracheal tube tip is 5 cm above the carina. Nasogastric tube enters the abdomen. Interstitial and early alveolar pulmonary edema may be minimally improved. No worsening or new finding. IMPRESSION: Endotracheal tube tip 5 cm above the carina. Interstitial and alveolar edema, possibly minimally improved. Electronically Signed   By: Nelson Chimes M.D.   On: 11/25/2017 16:18   Disposition   Pt is being discharged home today in good condition.  Follow-up Plans & Appointments    Follow-up Information    Jose Blanks, MD Follow up in 2 week(s).   Specialty:  Cardiology Contact information: [address] [place] [address] [phone]          Discharge Instructions    Diet - low sodium heart healthy   Complete by:  As directed    Discharge instructions   Complete by:  As directed    No driving for 2 days. No lifting over 5 lbs for 1 week. No sexual activity for 1 week. You may return to work in  1 week. Keep procedure site clean & dry. If you notice increased pain, swelling, bleeding or pus, call/return!  You may shower, but no soaking baths/hot tubs/pools for 1 week.   Increase activity slowly   Complete by:  As directed       Discharge Medications   Allergies as of 11/30/2017      Reactions   No Known Allergies       Medication List    TAKE these medications   acetaminophen 325 MG tablet Commonly known as:  TYLENOL Take 325-650 mg by mouth every 6 (six) hours as needed (back pain).   amitriptyline 50 MG tablet Commonly known as:  ELAVIL Take 1 tablet (50 mg total) by mouth at bedtime.   aspirin EC 81 MG tablet Take 1 tablet (81 mg total) by mouth daily.   atorvastatin 80 MG tablet Commonly known as:  LIPITOR Take 1 tablet (80 mg total) by mouth daily at 6 PM.   BESIVANCE 0.6 %  Susp Generic drug:  Besifloxacin HCl Place 1 drop into both eyes 3 (three) times daily.   carvedilol 3.125 MG tablet Commonly known as:  COREG Take 1 tablet (3.125 mg total) by mouth 2 (two) times daily with a meal.   cetirizine 10 MG tablet Commonly known as:  ZYRTEC Take 10 mg by mouth daily as needed for allergies.   clopidogrel 75 MG tablet Commonly known as:  PLAVIX Take 1 tablet (75 mg total) by mouth daily. Start taking on:  12/01/2017   cyanocobalamin 2000 MCG tablet Take 2,500 mcg by mouth daily.   DIALYVITE 800 0.8 MG Tabs Take 0.8 mg by mouth daily.   DUREZOL 0.05 % Emul Generic drug:  Difluprednate Place 1 drop into both eyes 3 (three) times daily.   gabapentin 300 MG capsule Commonly known as:  NEURONTIN 300mg  po qAM and 600mg  po qHS What changed:    how much to take  how to take this  when to take this   isosorbide-hydrALAZINE 20-37.5 MG tablet Commonly known as:  BIDIL Take 1 tablet by mouth 3 (three) times daily.   LORazepam 1 MG tablet Commonly known as:  ATIVAN Take 1 tablet (1 mg total) by mouth daily as needed for anxiety (nausea).     oxyCODONE 10 mg 12 hr tablet Commonly known as:  OXYCONTIN Take 1 tablet (10 mg total) by mouth at bedtime.   oxyCODONE 5 MG immediate release tablet Commonly known as:  Oxy IR/ROXICODONE Take 1-2 tablets (5-10 mg total) by mouth every 6 (six) hours as needed for severe pain.   VELPHORO 500 MG chewable tablet Generic drug:  sucroferric oxyhydroxide Chew 500-1,500 mg by mouth See admin instructions. Take 500mg  to 1500mg  depending on the meal size   Vitamin D3 3000 units Tabs Take 3,000 Units by mouth 3 (three) times a week.        Acute coronary syndrome (MI, NSTEMI, STEMI, etc) this admission?: Yes.     AHA/ACC Clinical Performance & Quality Measures: 1. Aspirin prescribed? - Yes 2. ADP Receptor Inhibitor (Plavix/Clopidogrel, Brilinta/Ticagrelor or Effient/Prasugrel) prescribed (includes medically managed patients)? - Yes 3. Beta Blocker prescribed? - Yes 4. High Intensity Statin (Lipitor 40-80mg  or Crestor 20-40mg ) prescribed? - Yes 5. EF assessed during THIS hospitalization? - Yes 6. For EF <40%, was ACEI/ARB prescribed? - Yes 7. For EF <40%, Aldosterone Antagonist (Spironolactone or Eplerenone) prescribed? - No - Reason:  pressure 8. Cardiac Rehab Phase II ordered (Included Medically managed Patients)? - Yes     Outstanding Labs/Studies   Echo in 1 week for effusion  Duration of Discharge Encounter   Greater than 30 minutes including physician time.  Signed, Tami Lin Duke, PA 11/30/2017, 11:22 AM    I have seen and examined this patient with Fabian Sharp.  Agree with above, note added to reflect my findings.  On exam, RRR, no murmurs, lungs clear.  Patient brought to the hospital with vague fatigue and weakness.  He was found to have an NSTEMi.  Left heart catheterization showed single vessel coronary disease.  Noted to have RCA occlusion with left to right collaterals.  No intervention was performed.  Plan for discharge today with follow-up in clinic.  Eryx Zane M.  Brettney Ficken MD 11/30/2017 12:06 PM

## 2017-12-02 ENCOUNTER — Telehealth: Payer: Self-pay

## 2017-12-02 DIAGNOSIS — Z23 Encounter for immunization: Secondary | ICD-10-CM | POA: Diagnosis not present

## 2017-12-02 DIAGNOSIS — D631 Anemia in chronic kidney disease: Secondary | ICD-10-CM | POA: Diagnosis not present

## 2017-12-02 DIAGNOSIS — N2581 Secondary hyperparathyroidism of renal origin: Secondary | ICD-10-CM | POA: Diagnosis not present

## 2017-12-02 DIAGNOSIS — N186 End stage renal disease: Secondary | ICD-10-CM | POA: Diagnosis not present

## 2017-12-02 DIAGNOSIS — D509 Iron deficiency anemia, unspecified: Secondary | ICD-10-CM | POA: Diagnosis not present

## 2017-12-02 MED ORDER — HYDRALAZINE HCL 25 MG PO TABS: 37.5000 mg | ORAL_TABLET | Freq: Three times a day (TID) | ORAL | 1 refills | Status: DC

## 2017-12-02 MED ORDER — ISOSORBIDE DINITRATE 20 MG PO TABS: 20.0000 mg | ORAL_TABLET | Freq: Three times a day (TID) | ORAL | 1 refills | Status: DC

## 2017-12-02 NOTE — Telephone Encounter (Signed)
Ok to separate Bidil into 2 separate generic meds for more affordable copay. Equivalent doses would be isosorbide dinitrate 20mg  TID and hydralazine 37.5mg  (take 1.5 of the 25mg  tablets) TID.

## 2017-12-02 NOTE — Telephone Encounter (Signed)
Call received from [facility]. Spoke with Daphne @ [place] outpatient pharmacy. The pt was prescribed Bidil 20-37.5mg  daily during his recent d/c from [facility] is unaffordable for the pt. Darnelle Bos is requesting authorization to separate the prescription since that would be more affordable. An Rx for Isosorbide and an Rx for Hydralazine. Ad her that I will fwd a message to our Pharm-D for approval.

## 2017-12-02 NOTE — Telephone Encounter (Signed)
Rx sent to [facility] for  Isosorbide Dinitrate 20mg  TID #270 R-1 Hydralazine 37.5mg  TID #405 R-1 As indicated by Pharm-D

## 2017-12-03 ENCOUNTER — Other Ambulatory Visit: Payer: Self-pay | Admitting: Physician Assistant

## 2017-12-03 ENCOUNTER — Encounter: Payer: Self-pay | Admitting: Podiatry

## 2017-12-03 ENCOUNTER — Ambulatory Visit (INDEPENDENT_AMBULATORY_CARE_PROVIDER_SITE_OTHER): Payer: Medicare Other | Admitting: Podiatry

## 2017-12-03 DIAGNOSIS — M79674 Pain in right toe(s): Secondary | ICD-10-CM

## 2017-12-03 DIAGNOSIS — B351 Tinea unguium: Secondary | ICD-10-CM

## 2017-12-03 DIAGNOSIS — I5032 Chronic diastolic (congestive) heart failure: Secondary | ICD-10-CM

## 2017-12-03 DIAGNOSIS — M79675 Pain in left toe(s): Secondary | ICD-10-CM | POA: Diagnosis not present

## 2017-12-03 DIAGNOSIS — G629 Polyneuropathy, unspecified: Secondary | ICD-10-CM

## 2017-12-03 NOTE — Addendum Note (Signed)
Addended by: Harriett Sine D on: 12/03/2017 02:43 PM   Modules accepted: Orders

## 2017-12-03 NOTE — Progress Notes (Signed)

## 2017-12-04 DIAGNOSIS — N186 End stage renal disease: Secondary | ICD-10-CM | POA: Diagnosis not present

## 2017-12-04 DIAGNOSIS — D631 Anemia in chronic kidney disease: Secondary | ICD-10-CM | POA: Diagnosis not present

## 2017-12-04 DIAGNOSIS — D509 Iron deficiency anemia, unspecified: Secondary | ICD-10-CM | POA: Diagnosis not present

## 2017-12-04 DIAGNOSIS — Z23 Encounter for immunization: Secondary | ICD-10-CM | POA: Diagnosis not present

## 2017-12-04 DIAGNOSIS — N2581 Secondary hyperparathyroidism of renal origin: Secondary | ICD-10-CM | POA: Diagnosis not present

## 2017-12-06 DIAGNOSIS — D509 Iron deficiency anemia, unspecified: Secondary | ICD-10-CM | POA: Diagnosis not present

## 2017-12-06 DIAGNOSIS — N2581 Secondary hyperparathyroidism of renal origin: Secondary | ICD-10-CM | POA: Diagnosis not present

## 2017-12-06 DIAGNOSIS — N186 End stage renal disease: Secondary | ICD-10-CM | POA: Diagnosis not present

## 2017-12-06 DIAGNOSIS — Z23 Encounter for immunization: Secondary | ICD-10-CM | POA: Diagnosis not present

## 2017-12-06 DIAGNOSIS — D631 Anemia in chronic kidney disease: Secondary | ICD-10-CM | POA: Diagnosis not present

## 2017-12-09 DIAGNOSIS — D509 Iron deficiency anemia, unspecified: Secondary | ICD-10-CM | POA: Diagnosis not present

## 2017-12-09 DIAGNOSIS — D631 Anemia in chronic kidney disease: Secondary | ICD-10-CM | POA: Diagnosis not present

## 2017-12-09 DIAGNOSIS — N186 End stage renal disease: Secondary | ICD-10-CM | POA: Diagnosis not present

## 2017-12-09 DIAGNOSIS — Z23 Encounter for immunization: Secondary | ICD-10-CM | POA: Diagnosis not present

## 2017-12-09 DIAGNOSIS — N2581 Secondary hyperparathyroidism of renal origin: Secondary | ICD-10-CM | POA: Diagnosis not present

## 2017-12-10 DIAGNOSIS — Z992 Dependence on renal dialysis: Secondary | ICD-10-CM | POA: Diagnosis not present

## 2017-12-10 DIAGNOSIS — I120 Hypertensive chronic kidney disease with stage 5 chronic kidney disease or end stage renal disease: Secondary | ICD-10-CM | POA: Diagnosis not present

## 2017-12-10 DIAGNOSIS — N186 End stage renal disease: Secondary | ICD-10-CM | POA: Diagnosis not present

## 2017-12-11 DIAGNOSIS — D631 Anemia in chronic kidney disease: Secondary | ICD-10-CM | POA: Diagnosis not present

## 2017-12-11 DIAGNOSIS — D509 Iron deficiency anemia, unspecified: Secondary | ICD-10-CM | POA: Diagnosis not present

## 2017-12-11 DIAGNOSIS — N186 End stage renal disease: Secondary | ICD-10-CM | POA: Diagnosis not present

## 2017-12-11 DIAGNOSIS — N2581 Secondary hyperparathyroidism of renal origin: Secondary | ICD-10-CM | POA: Diagnosis not present

## 2017-12-12 DIAGNOSIS — M25572 Pain in left ankle and joints of left foot: Secondary | ICD-10-CM | POA: Diagnosis not present

## 2017-12-12 DIAGNOSIS — M25671 Stiffness of right ankle, not elsewhere classified: Secondary | ICD-10-CM | POA: Diagnosis not present

## 2017-12-12 DIAGNOSIS — M25571 Pain in right ankle and joints of right foot: Secondary | ICD-10-CM | POA: Diagnosis not present

## 2017-12-12 DIAGNOSIS — M25672 Stiffness of left ankle, not elsewhere classified: Secondary | ICD-10-CM | POA: Diagnosis not present

## 2017-12-13 DIAGNOSIS — D509 Iron deficiency anemia, unspecified: Secondary | ICD-10-CM | POA: Diagnosis not present

## 2017-12-13 DIAGNOSIS — N2581 Secondary hyperparathyroidism of renal origin: Secondary | ICD-10-CM | POA: Diagnosis not present

## 2017-12-13 DIAGNOSIS — D631 Anemia in chronic kidney disease: Secondary | ICD-10-CM | POA: Diagnosis not present

## 2017-12-13 DIAGNOSIS — N186 End stage renal disease: Secondary | ICD-10-CM | POA: Diagnosis not present

## 2017-12-16 DIAGNOSIS — D631 Anemia in chronic kidney disease: Secondary | ICD-10-CM | POA: Diagnosis not present

## 2017-12-16 DIAGNOSIS — N2581 Secondary hyperparathyroidism of renal origin: Secondary | ICD-10-CM | POA: Diagnosis not present

## 2017-12-16 DIAGNOSIS — N186 End stage renal disease: Secondary | ICD-10-CM | POA: Diagnosis not present

## 2017-12-16 DIAGNOSIS — D509 Iron deficiency anemia, unspecified: Secondary | ICD-10-CM | POA: Diagnosis not present

## 2017-12-17 DIAGNOSIS — M25672 Stiffness of left ankle, not elsewhere classified: Secondary | ICD-10-CM | POA: Diagnosis not present

## 2017-12-17 DIAGNOSIS — M25571 Pain in right ankle and joints of right foot: Secondary | ICD-10-CM | POA: Diagnosis not present

## 2017-12-17 DIAGNOSIS — M25572 Pain in left ankle and joints of left foot: Secondary | ICD-10-CM | POA: Diagnosis not present

## 2017-12-17 DIAGNOSIS — M25671 Stiffness of right ankle, not elsewhere classified: Secondary | ICD-10-CM | POA: Diagnosis not present

## 2017-12-18 ENCOUNTER — Encounter: Payer: Self-pay | Admitting: Physician Assistant

## 2017-12-18 DIAGNOSIS — D509 Iron deficiency anemia, unspecified: Secondary | ICD-10-CM | POA: Diagnosis not present

## 2017-12-18 DIAGNOSIS — N186 End stage renal disease: Secondary | ICD-10-CM | POA: Diagnosis not present

## 2017-12-18 DIAGNOSIS — N2581 Secondary hyperparathyroidism of renal origin: Secondary | ICD-10-CM | POA: Diagnosis not present

## 2017-12-18 DIAGNOSIS — D631 Anemia in chronic kidney disease: Secondary | ICD-10-CM | POA: Diagnosis not present

## 2017-12-18 NOTE — Progress Notes (Addendum)
Cardiology Office Note    Date:  12/19/2017  ID:  Jose Garrett, DOB 1950-08-06, MRN [id number] PCP:  [name], MD  Cardiologist:  Lauree Chandler, MD   Chief Complaint: f/u MI  History of Present Illness:  Jose Garrett is a 67 y.o. 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   He was recently admitted 11/2017 with acute respiratory failure and elevated troponin (peak 23). He underwent cardiac cath on 11/25/17 with occlusion of the RCA which could have been his event but the RCA could not be engaged. The RCA filled distally from left to right collaterals. He also had 70% ostial-prox Cx, 40% prox LAD, 40% ramus. LVEDP was normal and AV gradient was c/w moderate AS. Medical therapy was recommended. 2D echo 11/26/17 showed new LV dysfunction with EF 35-40%, grade 2 DD, moderate AS, mild-moderate AI, mild LAE, small pericardial effusion. He was started on atorvastatin, carvedilol, Plavix and Bidil. F/u echo 1 week was recommended but not yet done. Last labs showed Hgb 9.6, Plt 124, K 4.0, Cr 8.8. Regarding his amyloid this is followed by oncology whose notes indicate prior tx with CyBorD, Velcade, Ixazomib and Dexamethasone. Bone marrow with no overt evidence of multiple myeloma. PET/CT scan showed no evidence of bony lesions or lymphadenopathy. He had neuropathy with some of his treatment measures so is no longer on current chemo. Last onc note indicates serologic remission. No recent lipids on file. Last LFTs showed AST 49, ALT OK.  He returns for follow-up. He had an echo scheduled before this visit but did not make it in time to have it done. Visit was made extremely challenging due to med inconsistency by patient's report. He initially crossed off atorvastatin, Plavix,  carvedilol, isordil and hydralazine as not taking. He said they weren't available at the pharmacy so he never picked them up. However he then said he was told by Dr. Jimmy Footman told him to stop carvedilol and hydralazine. He denies any h/o low BP at dialysis or any abnormal vitals. He states his BP typically runs 010-932 systolic and does not ever recalling it dropping. We called his pharmacy who confirmed he had picked up all 5. He does not think he is taking any of those meds presently. He feels relatively stable from hospital DC with chronic DOE and fatigue. No CP or bleeding reported. BP was 122/54 by tech but 152/70 by me.   Past Medical History:  Diagnosis Date  . Amyloidosis ([place])   . Anemia Dx 2016  . CAD (coronary artery disease)    a. cardiac cath on 11/25/17 with occlusion of the RCA which could have been his event but the RCA could not be engaged. The RCA filled distally from left to right collaterals. There were no severe stenoses in the left coronary system. Medical therapy recommended, started on plavix.  . Chronic combined systolic and diastolic CHF (congestive heart failure) ([place])    a. EF dropped to 35-40% with grade 2 DD by echo 11/2017.  [name] Diarrhea   . ESRD (end stage renal disease) ([place])   . Family history of adverse reaction to anesthesia    "my daughter can't take certain anesthesia agents" (09/08/2014)  . GERD (gastroesophageal reflux disease)   . History of blood transfusion 09/08/2014   "got hematoma after renal biopsy & HgB dropped"  .  Hyperlipidemia Dx 2012  . Hypertension Dx 2012  . Restless legs     Past Surgical History:  Procedure Laterality Date  . AV FISTULA PLACEMENT Right 12/27/2015   Procedure: Right Arm ARTERIOVENOUS (AV) FISTULA CREATION;  Surgeon: Angelia Mould, MD;  Location: [facility];  Service: Vascular;  Laterality: Right;  . COLONOSCOPY    . IR GENERIC HISTORICAL  01/27/2016   IR FLUORO GUIDE CV LINE RIGHT 01/27/2016 Arne Cleveland, MD  [facility]  . IR GENERIC HISTORICAL  01/27/2016   IR US GUIDE VASC ACCESS RIGHT 01/27/2016 Arne Cleveland, MD [facility]  . LAPAROSCOPIC CHOLECYSTECTOMY  03/2011  . LEFT HEART CATH AND CORONARY ANGIOGRAPHY N/A 11/25/2017   Procedure: LEFT HEART CATH AND CORONARY ANGIOGRAPHY;  Surgeon: Martinique, Peter M, MD;  Location: [facility];  Service: Cardiovascular;  Laterality: N/A;  . RENAL BIOPSY, PERCUTANEOUS Right 09/08/2014  . TONSILLECTOMY  ~ 1960    Current Medications: Current Meds  Medication Sig  . acetaminophen (TYLENOL) 325 MG tablet Take 325-650 mg by mouth every 6 (six) hours as needed (back pain).  [name] amitriptyline (ELAVIL) 50 MG tablet Take 1 tablet (50 mg total) by mouth at bedtime.  [name] aspirin EC 81 MG tablet Take 1 tablet (81 mg total) by mouth daily.  . B Complex-C-Folic Acid (DIALYVITE 253) 0.8 MG TABS Take 0.8 mg by mouth daily.  [name] BESIVANCE 0.6 % SUSP Place 1 drop into both eyes 3 (three) times daily.  . Cholecalciferol (VITAMIN D3) 3000 units TABS Take 3,000 Units by mouth 3 (three) times a week.   . cyanocobalamin 2000 MCG tablet Take 2,500 mcg by mouth daily.  . DUREZOL 0.05 % EMUL Place 1 drop into both eyes 3 (three) times daily.  [name] gabapentin (NEURONTIN) 300 MG capsule '300mg'$  po qAM and '600mg'$  po qHS (Patient taking differently: Take 300-600 mg by mouth See admin instructions. '300mg'$  po qAM and '600mg'$  po qHS)  . LORazepam (ATIVAN) 1 MG tablet Take 1 tablet (1 mg total) by mouth daily as needed for anxiety (nausea).  [name] oxyCODONE (OXY IR/ROXICODONE) 5 MG immediate release tablet Take 1-2 tablets (5-10 mg total) by mouth every 6 (six) hours as needed for severe pain.  [name] oxyCODONE (OXYCONTIN) 10 mg 12 hr tablet Take 1 tablet (10 mg total) by mouth at bedtime.  . VELPHORO 500 MG chewable tablet Chew 500-1,500 mg by mouth See admin instructions. Take '500mg'$  to '1500mg'$  depending on the meal size      Allergies:   No known allergies   Social History   Socioeconomic  History  . Marital status: Divorced    Spouse name: Not on file  . Number of children: Not on file  . Years of education: Not on file  . Highest education level: Not on file  Occupational History  . Not on file  Social Needs  . Financial resource strain: Not on file  . Food insecurity:    Worry: Not on file    Inability: Not on file  . Transportation needs:    Medical: Not on file    Non-medical: Not on file  Tobacco Use  . Smoking status: Former Smoker    Packs/day: 2.00    Years: 40.00    Pack years: 80.00    Types: Cigarettes    Last attempt to quit: 03/19/2011    Years since quitting: 6.7  . Smokeless tobacco: Never Used  Substance and Sexual Activity  . Alcohol use: No  Alcohol/week: 0.0 standard drinks  . Drug use: No    Comment: 09/08/2014 "I have used marijuana till the 1990's". Notes that he quit 15 yrs ago  . Sexual activity: Not Currently  Lifestyle  . Physical activity:    Days per week: Not on file    Minutes per session: Not on file  . Stress: Not on file  Relationships  . Social connections:    Talks on phone: Not on file    Gets together: Not on file    Attends religious service: Not on file    Active member of club or organization: Not on file    Attends meetings of clubs or organizations: Not on file    Relationship status: Not on file  Other Topics Concern  . 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      Family History:  The patient's family history includes Diabetes in his father, mother, and sister; Heart disease in his mother; Hypertension in his father and mother; Skin cancer in his mother.  ROS:   Please see the history of present illness. All other systems are reviewed and otherwise negative.    PHYSICAL EXAM:   VS:  BP (!) 152/70   Pulse 83   Ht '5\' 10"'$  (1.778 m)   Wt 183 lb (83 kg)   SpO2  98%   BMI 26.26 kg/m   BMI: Body mass index is 26.26 kg/m. GEN: Well nourished, well developed WM, in no acute distress HEENT: normocephalic, atraumatic Neck: no JVD or masses. + right carotid bruit with diminished left carotid sounds Cardiac: RRR 2/6 higher pitched SEM at both RUSB and LLSB, no rubs or gallops, no edema  Respiratory:  clear to auscultation bilaterally, normal work of breathing GI: soft, nontender, nondistended, + BS MS: no deformity or atrophy, R fistula graft Skin: warm and dry, no rash. Right groin cath site without hematoma, ecchymosis, or bruit. Neuro:  Alert and Oriented x 3, Strength and sensation are intact, follows commands Psych: euthymic mood, full affect  Wt Readings from Last 3 Encounters:  12/19/17 183 lb (83 kg)  11/30/17 180 lb 8 oz (81.9 kg)  10/12/17 200 lb (90.7 kg)      Studies/Labs Reviewed:   EKG:  EKG was ordered today and personally reviewed by me and demonstrates NSR 77bpm RBBB, diffuse ST-T changes similar to prior with marked TWI in precoridla leads, nonspecific ST-T changes in other leads  Recent Labs: 03/07/2017: TSH 1.37 11/25/2017: ALT 20; B Natriuretic Peptide 4,098.0 11/26/2017: Magnesium 2.1 11/29/2017: BUN 58; Creatinine, Ser 8.83; Potassium 4.0; Sodium 138 11/30/2017: Hemoglobin 9.6; Platelets 124   Lipid Panel    Component Value Date/Time   CHOL 192 02/25/2014 0959   TRIG 210 (H) 11/28/2017 1352   HDL 26 (L) 02/25/2014 0959   CHOLHDL 7.4 02/25/2014 0959   VLDL 40 02/25/2014 0959   LDLCALC 126 (H) 02/25/2014 0959    Additional studies/ records that were reviewed today include: Summarized above   ASSESSMENT & PLAN:   1. CAD with recent ACS - clinically appears stable but visit is challenging due to med reconciliation. He provided inconsistencies in med report today that were hard to follow. I will have nurse call Dr. Deterding's office to clarify what the issue was with carvedilol and hydralazine. I suspect it was BP  related. For now, I have asked him to restart atorvastatin, Plavix, and carvedilol given his recent sizeable MI. He will look for these at home and let us know if he can't find them. Will hold off on resuming isordil and hydralazine until we understand if it was a blood pressure issue that prompted nephrology's recommendation. We will need to have him come back to the office within a week or so with his meds in hand so that we can safely reassess med titration. Addendum: Spoke with Dr. Jimmy Footman who confirmed patient's BP was dropping at dialysis therefore he recommends to hold off on any BP lowering meds for now, including carvedilol. Please relay to patient - of the meds he had picked up but not started, he should still start atorvastatin and Plavix, but do not take carvedilol, isosorbide and hydralazine. Dr. Jimmy Footman said they will continue to follow regularly at HD because he does not react normally autonomically given his amyloid. I do think he still needs to f/u as planned to review echo and also have him bring in his meds physically so we can ensure med safety.  2. Chronic combined CHF - see above. Appears euvolemic. No ACEI/ARB given ESRD. Gave information on 2g sodium/2L fluid restriction/daily weights as well.  3. Aortic stenosis/aortic insufficiency - recently assessed as above. Continue to follow clinically. 4. Pericardial effusion - will reschedule echo. 5. R carotid bruit - not clear if this is related to vascular access or carotid disease. Will obtain carotid duplex.  Disposition: F/u with APP on our care team in 1 week for recheck (I am out of the office).  Medication Adjustments/Labs and Tests Ordered: Current medicines are reviewed at length with the patient today.  Concerns regarding medicines are outlined above. Medication changes, Labs and Tests ordered today are summarized above and listed in the Patient Instructions accessible in Encounters.   Signed, Jose Pitter, PA-C    12/19/2017 12:33 PM    [facility] [address], [place], [place]  [address] Phone: [phone]; Fax: [phone]

## 2017-12-19 ENCOUNTER — Encounter: Payer: Self-pay | Admitting: Physician Assistant

## 2017-12-19 ENCOUNTER — Other Ambulatory Visit: Payer: Self-pay

## 2017-12-19 ENCOUNTER — Telehealth: Payer: Self-pay | Admitting: Physician Assistant

## 2017-12-19 ENCOUNTER — Ambulatory Visit (INDEPENDENT_AMBULATORY_CARE_PROVIDER_SITE_OTHER): Payer: Medicare Other | Admitting: Physician Assistant

## 2017-12-19 ENCOUNTER — Ambulatory Visit (HOSPITAL_COMMUNITY): Payer: Medicare Other | Attending: Internal Medicine

## 2017-12-19 ENCOUNTER — Other Ambulatory Visit (HOSPITAL_COMMUNITY): Payer: Medicare Other

## 2017-12-19 VITALS — BP 152/70 | HR 83 | Ht 70.0 in | Wt 183.0 lb

## 2017-12-19 DIAGNOSIS — I5032 Chronic diastolic (congestive) heart failure: Secondary | ICD-10-CM | POA: Diagnosis not present

## 2017-12-19 DIAGNOSIS — I214 Non-ST elevation (NSTEMI) myocardial infarction: Secondary | ICD-10-CM

## 2017-12-19 DIAGNOSIS — R0989 Other specified symptoms and signs involving the circulatory and respiratory systems: Secondary | ICD-10-CM

## 2017-12-19 DIAGNOSIS — I2510 Atherosclerotic heart disease of native coronary artery without angina pectoris: Secondary | ICD-10-CM

## 2017-12-19 DIAGNOSIS — I3139 Other pericardial effusion (noninflammatory): Secondary | ICD-10-CM

## 2017-12-19 DIAGNOSIS — I350 Nonrheumatic aortic (valve) stenosis: Secondary | ICD-10-CM

## 2017-12-19 DIAGNOSIS — I351 Nonrheumatic aortic (valve) insufficiency: Secondary | ICD-10-CM

## 2017-12-19 DIAGNOSIS — I313 Pericardial effusion (noninflammatory): Secondary | ICD-10-CM | POA: Diagnosis not present

## 2017-12-19 DIAGNOSIS — I5042 Chronic combined systolic (congestive) and diastolic (congestive) heart failure: Secondary | ICD-10-CM

## 2017-12-19 LAB — ECHOCARDIOGRAM COMPLETE
HEIGHTINCHES: 70 in
Weight: 2928 oz

## 2017-12-19 NOTE — Patient Instructions (Addendum)
Medication Instructions:  Your physician recommends that you continue on your current medications as directed. Please refer to the Current Medication list given to you today.  If you need a refill on your cardiac medications before your next appointment, please call your pharmacy.   Lab work: None ordered  If you have labs (blood work) drawn today and your tests are completely normal, you will receive your results only by: [name] MyChart Message (if you have MyChart) OR . A paper copy in the mail If you have any lab test that is abnormal or we need to change your treatment, we will call you to review the results.  Testing/Procedures: Your physician has requested that you have a carotid duplex. This test is an ultrasound of the carotid arteries in your neck. It looks at blood flow through these arteries that supply the brain with blood. Allow one hour for this exam. There are no restrictions or special instructions.  Your physician has requested that you have an echocardiogram NEEDS TO BE ASAP.  ALREADY SCHEDULED FOR 12/24/17, BUT NEED IT SOONER IF CAN.. Echocardiography is a painless test that uses sound waves to create images of your heart. It provides your doctor with information about the size and shape of your heart and how well your heart's chambers and valves are working. This procedure takes approximately one hour. There are no restrictions for this procedure.    Follow-Up: At [facility], you and your health needs are our priority.  As part of our continuing mission to provide you with exceptional heart care, we have created designated Provider Care Teams.  These Care Teams include your primary Cardiologist (physician) and Advanced Practice Providers (APPs -  Physician Assistants and Nurse Practitioners) who all work together to provide you with the care you need, when you need it.  12/26/2017 ARRIVE AT 10:45 TO SEE VIN BHAGAT, PA-C   Any Other Special Instructions Will Be Listed  Below (If Applicable).  FIND YOUR MEDICINES LET us KNOW IF YOU CAN'T START THE ATORVASTATIN, CLOPIDOGREL AND CARVEDILOL DO NOT START THE HYDRALAZINE, AND ISORDIL

## 2017-12-19 NOTE — Telephone Encounter (Signed)
Called pt re: message below from [facility], [place]. Pt hasn't made it home yet, he is actually waiting for his Echocardiogram to be done this afternoon. Pt has been advised if it's too late to call this afternoon, to just call in the morning. Pt verbalized understanding.

## 2017-12-19 NOTE — Telephone Encounter (Signed)
Follow Up:     Pt said he was here earlier today, talking about his medication. He says he needs to talk to you again regarding his medicine.

## 2017-12-19 NOTE — Telephone Encounter (Signed)
Pt was here for his echocardiogram. [name] and spoke with pt re: medication changes and informed him that we confirmed with Dr. Deterding's office that he is not to start the Carvedilol. Pt understands that he should only start the Atorvastatin and the Plavix. Pt verbalized understanding.

## 2017-12-19 NOTE — Telephone Encounter (Signed)
Spoke with Dr. Jimmy Footman who confirmed patient's BP was dropping at dialysis therefore he recommends to hold off on any BP lowering meds for now, including carvedilol. Please relay to patient - of the meds he had picked up but not started, he should still start atorvastatin and Plavix, but do not take carvedilol, isosorbide and hydralazine. Dr. Jimmy Footman said they will continue to follow regularly at HD. I do think he still needs to f/u as planned to review echo and also have him bring in his meds physically so we can ensure med safety. Dayna Dunn PA-C

## 2017-12-20 ENCOUNTER — Other Ambulatory Visit: Payer: Self-pay | Admitting: Hematology

## 2017-12-20 DIAGNOSIS — N186 End stage renal disease: Secondary | ICD-10-CM | POA: Diagnosis not present

## 2017-12-20 DIAGNOSIS — D509 Iron deficiency anemia, unspecified: Secondary | ICD-10-CM | POA: Diagnosis not present

## 2017-12-20 DIAGNOSIS — D631 Anemia in chronic kidney disease: Secondary | ICD-10-CM | POA: Diagnosis not present

## 2017-12-20 DIAGNOSIS — N2581 Secondary hyperparathyroidism of renal origin: Secondary | ICD-10-CM | POA: Diagnosis not present

## 2017-12-20 MED ORDER — CLOPIDOGREL BISULFATE 75 MG PO TABS: 75.0000 mg | ORAL_TABLET | Freq: Every day | ORAL | 3 refills | Status: DC

## 2017-12-20 NOTE — Telephone Encounter (Signed)
Follow up    Patient is returning call in reference to medications

## 2017-12-20 NOTE — Telephone Encounter (Signed)
Pt returned my call and has advised that he found the Atorvastatin yesterday and started that last night. Pt does not have any Plavix as he can fine, so a 90 days supply was sent to [facility], per pt request. Pt thanked me for the help he has received.

## 2017-12-20 NOTE — Telephone Encounter (Signed)
Returned pts call and left a message for him to call back. 

## 2017-12-23 ENCOUNTER — Other Ambulatory Visit: Payer: Self-pay | Admitting: Physician Assistant

## 2017-12-23 ENCOUNTER — Telehealth: Payer: Self-pay

## 2017-12-23 DIAGNOSIS — N186 End stage renal disease: Secondary | ICD-10-CM | POA: Diagnosis not present

## 2017-12-23 DIAGNOSIS — D509 Iron deficiency anemia, unspecified: Secondary | ICD-10-CM | POA: Diagnosis not present

## 2017-12-23 DIAGNOSIS — N2581 Secondary hyperparathyroidism of renal origin: Secondary | ICD-10-CM | POA: Diagnosis not present

## 2017-12-23 DIAGNOSIS — R0989 Other specified symptoms and signs involving the circulatory and respiratory systems: Secondary | ICD-10-CM

## 2017-12-23 DIAGNOSIS — D631 Anemia in chronic kidney disease: Secondary | ICD-10-CM | POA: Diagnosis not present

## 2017-12-23 NOTE — Telephone Encounter (Signed)
-----   Message from Ledora Bottcher, [place] sent at 12/20/2017  8:23 AM EDT ----- Your cardiac ultrasound was stable from before. The squeeze function of your heart is strong. No changes in medications.

## 2017-12-23 NOTE — Telephone Encounter (Signed)
LVM, gave normal results. Left call back number if questions.

## 2017-12-24 ENCOUNTER — Ambulatory Visit (HOSPITAL_COMMUNITY)
Admission: RE | Admit: 2017-12-24 | Disposition: A | Discharge status: Home / Self Care | Payer: MEDICAID | Source: Ambulatory Visit | Attending: Physician Assistant | Admitting: Physician Assistant

## 2017-12-24 ENCOUNTER — Other Ambulatory Visit (HOSPITAL_COMMUNITY): Payer: Medicare Other

## 2017-12-24 DIAGNOSIS — M25671 Stiffness of right ankle, not elsewhere classified: Secondary | ICD-10-CM | POA: Diagnosis not present

## 2017-12-24 DIAGNOSIS — M25571 Pain in right ankle and joints of right foot: Secondary | ICD-10-CM | POA: Diagnosis not present

## 2017-12-24 DIAGNOSIS — M25572 Pain in left ankle and joints of left foot: Secondary | ICD-10-CM | POA: Diagnosis not present

## 2017-12-24 DIAGNOSIS — M25672 Stiffness of left ankle, not elsewhere classified: Secondary | ICD-10-CM | POA: Diagnosis not present

## 2017-12-24 NOTE — Progress Notes (Signed)
Cardiology Office Note    Date:  12/26/2017   ID:  Jose Garrett, DOB 1950-05-07, MRN [id number]  PCP:  Cameron Sprang, MD  Cardiologist: Dr. Angelena Form  Chief Complaint: 1 week  follow up  History of Present Illness:   Jose Garrett is a 67 y.o. 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   was recently admitted 11/2017 with acute respiratory failure and elevated troponin (peak 23). He underwent cardiac cath on 11/25/17 with occlusion of the RCA which could have been his event but the RCA could not be engaged. The RCA filled distally from left to right collaterals. He also had 70% ostial-prox Cx, 40% prox LAD, 40% ramus. LVEDP was normal and AV gradient was c/w moderate AS. Medical therapy was recommended. 2D echo 11/26/17 showed new LV dysfunction with EF 35-40%, grade 2 DD, moderate AS, mild-moderate AI, mild LAE, small pericardial effusion. He was started on atorvastatin, carvedilol, Plavix and Bidil. F/u echo 1 week was recommended but not yet done. Last labs showed Hgb 9.6, Plt 124, K 4.0, Cr 8.8. Regarding his amyloid this is followed by oncology whose notes indicate prior tx with CyBorD, Velcade, Ixazomib and Dexamethasone. Bone marrow with no overt evidence of multiple myeloma. PET/CT scan showed no evidence of bony lesions or lymphadenopathy. He had neuropathy with some of his treatment measures so is no longer on current chemo. Last onc note indicates serologic remission. No recent lipids on file. Last LFTs showed AST 49, ALT OK.  Seen in clinic 12/19/17 by Melina Copa. Clinically appears stable but visit is challenging due to med reconciliation. Plan to hold antihypertensive after discussion with Dr. Jimmy Footman due to low bp during dialysis. Advised to continue statin and plavix.   Here  today for follow up. BP running high on non-dialysis day at home and here as well which would be Tu, Thr, Sat and sun. Today his BP is 160/58. Otherwise feeling well. No issue. Denies chest pain, orthopnea, PND, LE edema, palpitations or melena. Compliant with medications.   Past Medical History:  Diagnosis Date  . Amyloidosis ([place])   . Anemia Dx 2016  . CAD (coronary artery disease)    a. cardiac cath on 11/25/17 with occlusion of the RCA which could have been his event but the RCA could not be engaged. The RCA filled distally from left to right collaterals. There were no severe stenoses in the left coronary system. Medical therapy recommended, started on plavix.  . Chronic combined systolic and diastolic CHF (congestive heart failure) ([place])    a. EF dropped to 35-40% with grade 2 DD by echo 11/2017.  [name] Diarrhea   . ESRD (end stage renal disease) ([place])   . Family history of adverse reaction to anesthesia    "my daughter can't take certain anesthesia agents" (09/08/2014)  . GERD (gastroesophageal reflux disease)   . History of blood transfusion 09/08/2014   "got hematoma after renal biopsy & HgB dropped"  . Hyperlipidemia Dx 2012  . Hypertension Dx 2012  . Restless legs     Past Surgical History:  Procedure Laterality Date  . AV FISTULA PLACEMENT Right 12/27/2015   Procedure: Right Arm ARTERIOVENOUS (AV) FISTULA CREATION;  Surgeon: Angelia Mould, MD;  Location: [facility];  Service: Vascular;  Laterality: Right;  . COLONOSCOPY    . IR GENERIC HISTORICAL  01/27/2016   IR FLUORO GUIDE CV LINE RIGHT 01/27/2016 Arne Cleveland, MD [facility]  . IR GENERIC HISTORICAL  01/27/2016   IR US GUIDE VASC ACCESS RIGHT 01/27/2016 Arne Cleveland, MD [facility]  . LAPAROSCOPIC CHOLECYSTECTOMY  03/2011  . LEFT HEART CATH AND CORONARY ANGIOGRAPHY N/A 11/25/2017   Procedure: LEFT HEART CATH AND CORONARY ANGIOGRAPHY;  Surgeon: Martinique, Peter M, MD;  Location: [facility];  Service:  Cardiovascular;  Laterality: N/A;  . RENAL BIOPSY, PERCUTANEOUS Right 09/08/2014  . TONSILLECTOMY  ~ 1960    Current Medications: Prior to Admission medications   Medication Sig Start Date End Date Taking? Authorizing Provider  acetaminophen (TYLENOL) 325 MG tablet Take 325-650 mg by mouth every 6 (six) hours as needed (back pain).    [provider]  amitriptyline (ELAVIL) 50 MG tablet Take 1 tablet (50 mg total) by mouth at bedtime. 07/25/17   [name], MD  aspirin EC 81 MG tablet Take 1 tablet (81 mg total) by mouth daily. 11/30/17   Cheryln Manly, NP  atorvastatin (LIPITOR) 80 MG tablet Take 80 mg by mouth daily.    [provider]  B Complex-C-Folic Acid (DIALYVITE 694) 0.8 MG TABS Take 0.8 mg by mouth daily.    [provider]  BESIVANCE 0.6 % SUSP Place 1 drop into both eyes 3 (three) times daily. 11/01/17   [provider]  Cholecalciferol (VITAMIN D3) 3000 units TABS Take 3,000 Units by mouth 3 (three) times a week.     [provider]  clopidogrel (PLAVIX) 75 MG tablet Take 1 tablet (75 mg total) by mouth daily. 12/20/17   Dunn, Nedra Hai, PA-C  cyanocobalamin 2000 MCG tablet Take 2,500 mcg by mouth daily.    [provider]  DUREZOL 0.05 % EMUL Place 1 drop into both eyes 3 (three) times daily. 11/01/17   [provider]  gabapentin (NEURONTIN) 300 MG capsule TAKE 1 CAPSULE BY MOUTH EVERY MORNING AND 2 CAPSULES EACH NIGHT AT BEDTIME 12/20/17   [name], MD  LORazepam (ATIVAN) 1 MG tablet Take 1 tablet (1 mg total) by mouth daily as needed for anxiety (nausea). 01/08/17   [name], MD  oxyCODONE (OXY IR/ROXICODONE) 5 MG immediate release tablet Take 1-2 tablets (5-10 mg total) by mouth every 6 (six) hours as needed for severe pain. 10/02/17   [name], MD  oxyCODONE (OXYCONTIN) 10 mg 12 hr tablet Take 1 tablet (10 mg total) by mouth at bedtime. 07/25/17   [name], MD    VELPHORO 500 MG chewable tablet Chew 500-1,500 mg by mouth See admin instructions. Take 539m to 15069mdepending on the meal size 12/28/16   [provider]    Allergies:   No known allergies   Social History   Socioeconomic History  . Marital status: Divorced    Spouse name: Not on file  . Number of children: Not on file  . Years of education: Not on file  . Highest education level: Not on file  Occupational History  . Not on file  Social Needs  . Financial resource strain: Not on file  . Food insecurity:    Worry: Not on file    Inability: Not on file  . Transportation needs:    Medical: Not on file    Non-medical: Not on file  Tobacco Use  . Smoking status: Former Smoker    Packs/day: 2.00    Years: 40.00  Pack years: 80.00    Types: Cigarettes    Last attempt to quit: 03/19/2011    Years since quitting: 6.7  . Smokeless tobacco: Never Used  Substance and Sexual Activity  . Alcohol use: No    Alcohol/week: 0.0 standard drinks  . Drug use: No    Comment: 09/08/2014 "I have used marijuana till the 1990's". Notes that he quit 15 yrs ago  . Sexual activity: Not Currently  Lifestyle  . Physical activity:    Days per week: Not on file    Minutes per session: Not on file  . Stress: Not on file  Relationships  . Social connections:    Talks on phone: Not on file    Gets together: Not on file    Attends religious service: Not on file    Active member of club or organization: Not on file    Attends meetings of clubs or organizations: Not on file    Relationship status: Not on file  Other Topics Concern  . 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      Family History:  The patient's family history includes Diabetes in his father, mother, and sister; Heart disease in his mother; Hypertension in his father and mother;  Skin cancer in his mother.   ROS:   Please see the history of present illness.    ROS All other systems reviewed and are negative.   PHYSICAL EXAM:   VS:  BP (!) 160/58   Pulse 74   Ht _0  (1.778 m)   Wt 181 lb 6.4 oz (82.3 kg)   SpO2 94%   BMI 26.03 kg/m    GEN: Well nourished, well developed, in no acute distress  HEENT: normal  Neck: no JVD or masses. R  carotid bruits, Cardiac: RRR; systolic murmurs, rubs, or gallops,no edema  Respiratory:  clear to auscultation bilaterally, normal work of breathing GI: soft, nontender, nondistended, + BS MS: no deformity or atrophy  Skin: warm and dry, no rash Neuro:  Alert and Oriented x 3, Strength and sensation are intact Psych: euthymic mood, full affect  Wt Readings from Last 3 Encounters:  12/26/17 181 lb 6.4 oz (82.3 kg)  12/19/17 183 lb (83 kg)  11/30/17 180 lb 8 oz (81.9 kg)      Studies/Labs Reviewed:   EKG:  EKG is not ordered today.    Recent Labs: 03/07/2017: TSH 1.37 11/25/2017: ALT 20; B Natriuretic Peptide 4,098.0 11/26/2017: Magnesium 2.1 11/29/2017: BUN 58; Creatinine, Ser 8.83; Potassium 4.0; Sodium 138 11/30/2017: Hemoglobin 9.6; Platelets 124   Lipid Panel    Component Value Date/Time   CHOL 192 02/25/2014 0959   TRIG 210 (H) 11/28/2017 1352   HDL 26 (L) 02/25/2014 0959   CHOLHDL 7.4 02/25/2014 0959   VLDL 40 02/25/2014 0959   LDLCALC 126 (H) 02/25/2014 0959    Additional studies/ records that were reviewed today include:   Echocardiogram: 12/19/17 Study Conclusions  - Left ventricle: The cavity size was normal. There was moderate   concentric hypertrophy. Consider amyloid infiltration. Consider   PYP scan. Systolic function was vigorous. The estimated ejection   fraction was in the range of 65% to 70%. Wall motion was normal;   there were no regional wall motion abnormalities. Doppler   parameters are consistent with abnormal left  ventricular   relaxation (grade 1 diastolic dysfunction). -  Aortic valve: Functionally bicuspid; severely thickened, severely   calcified leaflets; fusion of the left-noncoronary commissure.   Valve mobility was restricted. There was moderate stenosis. There   was mild regurgitation. Peak velocity (S): 385 cm/s. Mean   gradient (S): 31 mm Hg. Valve area (VTI): 1.27 cm^2. Valve area   (Vmax): 1.07 cm^2. Valve area (Vmean): 1.08 cm^2. - Mitral valve: Calcified annulus. - Right ventricle: The cavity size was normal. Wall thickness was   mildly increased. - Pericardium, extracardiac: A trivial pericardial effusion was   identified.  Impressions:  - When compared to prior, EF has improved.  LEFT HEART CATH AND CORONARY ANGIOGRAPHY  11/25/17  Conclusion     Prox LAD lesion is 40% stenosed.  Ramus lesion is 40% stenosed.  Ost Cx to Prox Cx lesion is 70% stenosed.  Ost RCA to Mid RCA lesion is 100% stenosed.  LV end diastolic pressure is normal.  There is moderate aortic valve stenosis.   1. Single vessel occlusive CAD with occluded RCA. Unable to engage with a catheter.  2. Normal LVEDP 19 mm Hg 3. AV gradient of 25 mm Hg peak to peak c/w moderate Aortic stenosis.   Plan: will transfer to ICU. Ventilator management per ICU. Dialysis per Nephrology service. Will resume IV heparin 8 hours post sheath removal and continue to cycle cardiac enzymes. It is unclear at this time whether RCA occlusion is chronic or new. Given inability to engage vessel he is not a candidate for PCI. No significant obstructive disease in the LCA.   Recommend Aspirin 48m daily for moderate CAD.   ASSESSMENT & PLAN:    1. CAD - Cath with single vessel occlusive CAD with occluded RCA. Unable to engage with a catheter. Continue DAPT with ASA and plavix. Continue statin. No chest pain.   2. Chronic combined CHF - BB/Imdur and hydralazine held by Dr. DJimmy Footmandue to soft BP during dialysis. - LVEF improved to 65-70% with grade 1 DD by repeat echo 12/19/17.  -  Trial of low dose BB non on dialysis day after approval from nephrologist.   3. Bicuspid aortic valve with moderate AS - Echo as above. Follow with yearly echo.  4. Carotid bruit - Pending doppler  5. ESRD on HD (MWF) - He will discussed with nephrologist tomorrow if okay to start coreg 3.1232mBID non on dialysis days for elevated blood pressure.   6. HTN  - On non dialysis days. As above.   Medication Adjustments/Labs and Tests Ordered: Current medicines are reviewed at length with the patient today.  Concerns regarding medicines are outlined above.  Medication changes, Labs and Tests ordered today are listed in the Patient Instructions below. Patient Instructions  Medication Instructions:  Your physician has recommended you make the following change in your medication:  1.  START Coreg 3.125 mg taking 1 tablet twice a day ON NON DIALYSIS DAYS ONLY AND DO NOT START UNTIL AFTER YOU TALK WITH YOUR KIDNEY DR TOMarylene BuergerIf you need a refill on your cardiac medications before your next appointment, please call your pharmacy.   Lab work: None orderd  If you have labs (blood work) drawn today and your tests are completely normal, you will receive your results only by: . Marland KitchenyChart Message (if you have MyChart) OR . A paper copy in the mail If you have any lab test that is abnormal or we need to change your treatment, we will  call you to review the results.  Testing/Procedures: None ordered  Follow-Up: At [facility], you and your health needs are our priority.  As part of our continuing mission to provide you with exceptional heart care, we have created designated Provider Care Teams.  These Care Teams include your primary Cardiologist (physician) and Advanced Practice Providers (APPs -  Physician Assistants and Nurse Practitioners) who all work together to provide you with the care you need, when you need it. You will need a follow up appointment in 3 months.  Please call our office 2  months in advance to schedule this appointment.  You may see Lauree Chandler, MD or one of the following Advanced Practice Providers on your designated Care Team:   [place], PA-C Melina Copa, PA-C . Ermalinda Barrios, PA-C  Any Other Special Instructions Will Be Listed Below (If Applicable).       Jarrett Soho, [place]  12/26/2017 11:28 AM    [facility] [place], [place], [place]  [address] Phone: [phone]; Fax: [phone]

## 2017-12-25 ENCOUNTER — Ambulatory Visit: Payer: Medicare Other | Admitting: Physician Assistant

## 2017-12-25 DIAGNOSIS — N2581 Secondary hyperparathyroidism of renal origin: Secondary | ICD-10-CM | POA: Diagnosis not present

## 2017-12-25 DIAGNOSIS — D631 Anemia in chronic kidney disease: Secondary | ICD-10-CM | POA: Diagnosis not present

## 2017-12-25 DIAGNOSIS — N186 End stage renal disease: Secondary | ICD-10-CM | POA: Diagnosis not present

## 2017-12-25 DIAGNOSIS — D509 Iron deficiency anemia, unspecified: Secondary | ICD-10-CM | POA: Diagnosis not present

## 2017-12-26 ENCOUNTER — Ambulatory Visit (INDEPENDENT_AMBULATORY_CARE_PROVIDER_SITE_OTHER): Payer: Medicare Other | Admitting: Physician Assistant

## 2017-12-26 ENCOUNTER — Encounter: Payer: Self-pay | Admitting: Physician Assistant

## 2017-12-26 VITALS — BP 160/58 | HR 74 | Ht 70.0 in | Wt 181.4 lb

## 2017-12-26 DIAGNOSIS — M25572 Pain in left ankle and joints of left foot: Secondary | ICD-10-CM | POA: Diagnosis not present

## 2017-12-26 DIAGNOSIS — I5042 Chronic combined systolic (congestive) and diastolic (congestive) heart failure: Secondary | ICD-10-CM | POA: Diagnosis not present

## 2017-12-26 DIAGNOSIS — M25672 Stiffness of left ankle, not elsewhere classified: Secondary | ICD-10-CM | POA: Diagnosis not present

## 2017-12-26 DIAGNOSIS — N186 End stage renal disease: Secondary | ICD-10-CM | POA: Diagnosis not present

## 2017-12-26 DIAGNOSIS — M25671 Stiffness of right ankle, not elsewhere classified: Secondary | ICD-10-CM | POA: Diagnosis not present

## 2017-12-26 DIAGNOSIS — Z992 Dependence on renal dialysis: Secondary | ICD-10-CM

## 2017-12-26 DIAGNOSIS — I350 Nonrheumatic aortic (valve) stenosis: Secondary | ICD-10-CM

## 2017-12-26 DIAGNOSIS — R0989 Other specified symptoms and signs involving the circulatory and respiratory systems: Secondary | ICD-10-CM | POA: Diagnosis not present

## 2017-12-26 DIAGNOSIS — M25571 Pain in right ankle and joints of right foot: Secondary | ICD-10-CM | POA: Diagnosis not present

## 2017-12-26 DIAGNOSIS — I2510 Atherosclerotic heart disease of native coronary artery without angina pectoris: Secondary | ICD-10-CM | POA: Diagnosis not present

## 2017-12-26 MED ORDER — CARVEDILOL 3.125 MG PO TABS: ORAL_TABLET | ORAL | 3 refills | Status: DC

## 2017-12-26 NOTE — Patient Instructions (Signed)
Medication Instructions:  Your physician has recommended you make the following change in your medication:  1.  START Coreg 3.125 mg taking 1 tablet twice a day ON NON DIALYSIS DAYS ONLY AND DO NOT START UNTIL AFTER YOU TALK WITH YOUR KIDNEY DR Marylene Buerger  If you need a refill on your cardiac medications before your next appointment, please call your pharmacy.   Lab work: None orderd  If you have labs (blood work) drawn today and your tests are completely normal, you will receive your results only by: [name] MyChart Message (if you have MyChart) OR . A paper copy in the mail If you have any lab test that is abnormal or we need to change your treatment, we will call you to review the results.  Testing/Procedures: None ordered  Follow-Up: At [facility], you and your health needs are our priority.  As part of our continuing mission to provide you with exceptional heart care, we have created designated Provider Care Teams.  These Care Teams include your primary Cardiologist (physician) and Advanced Practice Providers (APPs -  Physician Assistants and Nurse Practitioners) who all work together to provide you with the care you need, when you need it. You will need a follow up appointment in 3 months.  Please call our office 2 months in advance to schedule this appointment.  You may see Lauree Chandler, MD or one of the following Advanced Practice Providers on your designated Care Team:   [place], PA-C Melina Copa, PA-C . Ermalinda Barrios, PA-C  Any Other Special Instructions Will Be Listed Below (If Applicable).

## 2017-12-27 DIAGNOSIS — N186 End stage renal disease: Secondary | ICD-10-CM | POA: Diagnosis not present

## 2017-12-27 DIAGNOSIS — D631 Anemia in chronic kidney disease: Secondary | ICD-10-CM | POA: Diagnosis not present

## 2017-12-27 DIAGNOSIS — D509 Iron deficiency anemia, unspecified: Secondary | ICD-10-CM | POA: Diagnosis not present

## 2017-12-27 DIAGNOSIS — N2581 Secondary hyperparathyroidism of renal origin: Secondary | ICD-10-CM | POA: Diagnosis not present

## 2017-12-30 DIAGNOSIS — D509 Iron deficiency anemia, unspecified: Secondary | ICD-10-CM | POA: Diagnosis not present

## 2017-12-30 DIAGNOSIS — N2581 Secondary hyperparathyroidism of renal origin: Secondary | ICD-10-CM | POA: Diagnosis not present

## 2017-12-30 DIAGNOSIS — D631 Anemia in chronic kidney disease: Secondary | ICD-10-CM | POA: Diagnosis not present

## 2017-12-30 DIAGNOSIS — N186 End stage renal disease: Secondary | ICD-10-CM | POA: Diagnosis not present

## 2017-12-31 ENCOUNTER — Ambulatory Visit (HOSPITAL_COMMUNITY)
Admission: RE | Admit: 2017-12-31 | Discharge: 2017-12-31 | Disposition: A | Discharge status: Home / Self Care | Payer: Medicare Other | Source: Ambulatory Visit | Attending: Cardiology | Admitting: Cardiology

## 2017-12-31 DIAGNOSIS — R0989 Other specified symptoms and signs involving the circulatory and respiratory systems: Secondary | ICD-10-CM | POA: Diagnosis not present

## 2018-01-01 DIAGNOSIS — N186 End stage renal disease: Secondary | ICD-10-CM | POA: Diagnosis not present

## 2018-01-01 DIAGNOSIS — N2581 Secondary hyperparathyroidism of renal origin: Secondary | ICD-10-CM | POA: Diagnosis not present

## 2018-01-01 DIAGNOSIS — D509 Iron deficiency anemia, unspecified: Secondary | ICD-10-CM | POA: Diagnosis not present

## 2018-01-01 DIAGNOSIS — D631 Anemia in chronic kidney disease: Secondary | ICD-10-CM | POA: Diagnosis not present

## 2018-01-02 DIAGNOSIS — M25671 Stiffness of right ankle, not elsewhere classified: Secondary | ICD-10-CM | POA: Diagnosis not present

## 2018-01-02 DIAGNOSIS — M25672 Stiffness of left ankle, not elsewhere classified: Secondary | ICD-10-CM | POA: Diagnosis not present

## 2018-01-02 DIAGNOSIS — M25572 Pain in left ankle and joints of left foot: Secondary | ICD-10-CM | POA: Diagnosis not present

## 2018-01-02 DIAGNOSIS — M25571 Pain in right ankle and joints of right foot: Secondary | ICD-10-CM | POA: Diagnosis not present

## 2018-01-03 ENCOUNTER — Ambulatory Visit: Payer: Medicare Other | Admitting: Neurology

## 2018-01-03 DIAGNOSIS — N186 End stage renal disease: Secondary | ICD-10-CM | POA: Diagnosis not present

## 2018-01-03 DIAGNOSIS — D631 Anemia in chronic kidney disease: Secondary | ICD-10-CM | POA: Diagnosis not present

## 2018-01-03 DIAGNOSIS — N2581 Secondary hyperparathyroidism of renal origin: Secondary | ICD-10-CM | POA: Diagnosis not present

## 2018-01-03 DIAGNOSIS — D509 Iron deficiency anemia, unspecified: Secondary | ICD-10-CM | POA: Diagnosis not present

## 2018-01-06 DIAGNOSIS — D509 Iron deficiency anemia, unspecified: Secondary | ICD-10-CM | POA: Diagnosis not present

## 2018-01-06 DIAGNOSIS — N2581 Secondary hyperparathyroidism of renal origin: Secondary | ICD-10-CM | POA: Diagnosis not present

## 2018-01-06 DIAGNOSIS — D631 Anemia in chronic kidney disease: Secondary | ICD-10-CM | POA: Diagnosis not present

## 2018-01-06 DIAGNOSIS — N186 End stage renal disease: Secondary | ICD-10-CM | POA: Diagnosis not present

## 2018-01-07 ENCOUNTER — Ambulatory Visit (INDEPENDENT_AMBULATORY_CARE_PROVIDER_SITE_OTHER): Payer: Medicare Other | Admitting: Neurology

## 2018-01-07 ENCOUNTER — Encounter: Payer: Self-pay | Admitting: Neurology

## 2018-01-07 ENCOUNTER — Other Ambulatory Visit: Payer: Self-pay

## 2018-01-07 VITALS — BP 178/70 | HR 76 | Ht 70.0 in | Wt 182.0 lb

## 2018-01-07 DIAGNOSIS — M25671 Stiffness of right ankle, not elsewhere classified: Secondary | ICD-10-CM | POA: Diagnosis not present

## 2018-01-07 DIAGNOSIS — M5417 Radiculopathy, lumbosacral region: Secondary | ICD-10-CM | POA: Diagnosis not present

## 2018-01-07 DIAGNOSIS — M25672 Stiffness of left ankle, not elsewhere classified: Secondary | ICD-10-CM | POA: Diagnosis not present

## 2018-01-07 DIAGNOSIS — I2510 Atherosclerotic heart disease of native coronary artery without angina pectoris: Secondary | ICD-10-CM | POA: Diagnosis not present

## 2018-01-07 DIAGNOSIS — M25572 Pain in left ankle and joints of left foot: Secondary | ICD-10-CM | POA: Diagnosis not present

## 2018-01-07 DIAGNOSIS — G629 Polyneuropathy, unspecified: Secondary | ICD-10-CM

## 2018-01-07 DIAGNOSIS — M25571 Pain in right ankle and joints of right foot: Secondary | ICD-10-CM | POA: Diagnosis not present

## 2018-01-07 MED ORDER — CYCLOBENZAPRINE HCL 5 MG PO TABS: ORAL_TABLET | ORAL | 11 refills | Status: DC

## 2018-01-07 MED ORDER — GABAPENTIN 300 MG PO CAPS: ORAL_CAPSULE | ORAL | 11 refills | Status: DC

## 2018-01-07 NOTE — Patient Instructions (Signed)
1. Increase gabapentin 300mg : Take 1 cap in AM, 2 caps at night. If still a lot of symptoms, increase the night dose to 3 caps every night 2. Let's try Flexeril 5mg  every night, if no side effects, can take up to 3 times a day 3. Refer to PT for lumbar radiculopathy 4. Follow-up in 6 months, call for any changes

## 2018-01-07 NOTE — Progress Notes (Signed)
NEUROLOGY FOLLOW UP OFFICE NOTE  Jose Garrett [id number] 1950/09/09  HISTORY OF PRESENT ILLNESS: I had the pleasure of seeing Jose Garrett in follow-up in the neurology clinic on 01/07/2018.  The patient was last seen 6 months ago for worsening neuropathy. His EMG/NCV of both lower extremities showed severe chronic symmetric axonal sensorimotor polyneuropathy, as well as a superimposed L3-4 radiculopathy in both lower extremities, worse on the right. Since his last visit, he reports that the neuropathy symptoms have changed from the burning/pins and needles to now having muscle contractions of both thighs, as well as his back tensing up then relaxing intermittently for a few hours. He would hold his thigh and feel the muscles contracting. He has noticed symptoms are worse after dialysis. They seem to only occur when he is sitting or supine. Last night he could not sleep and took 1/2 tablet of oxycodone, which helped him sleep for 1.5 hours. He has quick jabs of pain in his ankles and feet. He has numbness and tingling in both hands, with pain in his fingers, worse after dialysis. He reports stopping amitriptyline 50mg  qhs because he felt it was not helping. He is taking gabapentin 300mg  in AM, 300-600mg  in PM (depending on how much he ate, he takes higher dose if he eats a bigger meal). He denies any side effects on gabapentin. He has back pain. He is producing less urine. He has occasional constipation. No falls.   HPI 03/07/2017: This is a very pleasant 67 yo RH man with a history of AL amyloidosis with kidney involvement and nephrotic range proteinuria. He has had peripheral neuropathy from previous Velcade/vitamin B12 deficiency/hemodialysis which has worsened slightly, and has also had back issues. He is being referred for further evaluation of symptoms to rule out radiculopathy contributing to symptoms and consideration for EMG/NCS. He is currently on Ixazomib maintenance therapy every 2 weeks. He  reports that the neuropathy has changed some since the Fall. He still gets numbness on the bottom of his feet, feeling like he is walking on a hot sidewalk when barefoot. He has been having quick stabbing pains that make him exclaim out loud, but now symptoms are a little more pervasive like he has a bruise, and any position he may be in he would "get a hit" for a second or two. He has constant pins and needles in his feet. He has had a few close calls with falls but has not fully fallen down. He continues to drive without any weakness noted. His hands are not affected. He does have back pain, with occasional shooting pain from his back down his legs, but they do not radiate to his feet. He has trouble with sleep maintenance due to pain in his feet waking him up. He has been on low dose gabapentin 250mg  qhs (takes the liquid 250mg /24mL formulation) for the past 2 years, and started amitriptyline 50mg  qhs last summer. Taking gabapentin BID caused drowsiness. The medications do help some but "do not help enough." The pain got so bad around Thanksgiving that it started to "affect my mental health." He denies any headaches, dizziness, diplopia, dysarthria/dysphagia, neck pain, bowel/bladder dysfunction except for occasional constipation.    PAST MEDICAL HISTORY: Past Medical History:  Diagnosis Date  . Amyloidosis ([place])   . Anemia Dx 2016  . CAD (coronary artery disease)    a. cardiac cath on 11/25/17 with occlusion of the RCA which could have been his event but the RCA could not be engaged.  The RCA filled distally from left to right collaterals. There were no severe stenoses in the left coronary system. Medical therapy recommended, started on plavix.  . Chronic combined systolic and diastolic CHF (congestive heart failure) ([place])    a. EF dropped to 35-40% with grade 2 DD by echo 11/2017.  [name] Diarrhea   . ESRD (end stage renal disease) ([place])   . Family history of adverse reaction to anesthesia    "my  daughter can't take certain anesthesia agents" (09/08/2014)  . GERD (gastroesophageal reflux disease)   . History of blood transfusion 09/08/2014   "got hematoma after renal biopsy & HgB dropped"  . Hyperlipidemia Dx 2012  . Hypertension Dx 2012  . Restless legs     MEDICATIONS: Current Outpatient Medications on File Prior to Visit  Medication Sig Dispense Refill  . acetaminophen (TYLENOL) 325 MG tablet Take 325-650 mg by mouth every 6 (six) hours as needed (back pain).    [name] aspirin EC 81 MG tablet Take 1 tablet (81 mg total) by mouth daily. 30 tablet 11  . atorvastatin (LIPITOR) 80 MG tablet Take 80 mg by mouth daily.    . B Complex-C-Folic Acid (DIALYVITE 332) 0.8 MG TABS Take 0.8 mg by mouth daily.    . carvedilol (COREG) 3.125 MG tablet Take 1 tablet by mouth twice a day on NON DIALYSIS DAYS ONLY 180 tablet 3  . Cholecalciferol (VITAMIN D3) 3000 units TABS Take 3,000 Units by mouth 3 (three) times a week.     . clopidogrel (PLAVIX) 75 MG tablet Take 1 tablet (75 mg total) by mouth daily. 90 tablet 3  . cyanocobalamin 2000 MCG tablet Take 2,500 mcg by mouth daily.    [name] ethyl chloride spray   3  . gabapentin (NEURONTIN) 300 MG capsule TAKE 1 CAPSULE BY MOUTH EVERY MORNING AND 2 CAPSULES EACH NIGHT AT BEDTIME 90 capsule 1  . LORazepam (ATIVAN) 1 MG tablet Take 1 tablet (1 mg total) by mouth daily as needed for anxiety (nausea). 30 tablet 0  . oxyCODONE (OXY IR/ROXICODONE) 5 MG immediate release tablet Take 1-2 tablets (5-10 mg total) by mouth every 6 (six) hours as needed for severe pain. 60 tablet 0  . oxyCODONE (OXYCONTIN) 10 mg 12 hr tablet Take 10 mg by mouth as needed (for pain).    . VELPHORO 500 MG chewable tablet Chew 500-1,500 mg by mouth See admin instructions. Take 500mg  to 1500mg  depending on the meal size  10   No current facility-administered medications on file prior to visit.     ALLERGIES: Allergies  Allergen Reactions  . No Known Allergies     FAMILY  HISTORY: Family History  Problem Relation Age of Onset  . Hypertension Mother   . Diabetes Mother   . Heart disease Mother   . Skin cancer Mother   . Hypertension Father   . Diabetes Father   . Diabetes Sister     SOCIAL HISTORY: Social History   Socioeconomic History  . Marital status: Divorced    Spouse name: Not on file  . Number of children: Not on file  . Years of education: Not on file  . Highest education level: Not on file  Occupational History  . Not on file  Social Needs  . Financial resource strain: Not on file  . Food insecurity:    Worry: Not on file    Inability: Not on file  . Transportation needs:    Medical: Not on file  Non-medical: Not on file  Tobacco Use  . Smoking status: Former Smoker    Packs/day: 2.00    Years: 40.00    Pack years: 80.00    Types: Cigarettes    Last attempt to quit: 03/19/2011    Years since quitting: 6.8  . Smokeless tobacco: Never Used  Substance and Sexual Activity  . Alcohol use: No    Alcohol/week: 0.0 standard drinks  . Drug use: No    Comment: 09/08/2014 "I have used marijuana till the 1990's". Notes that he quit 15 yrs ago  . Sexual activity: Not Currently  Lifestyle  . Physical activity:    Days per week: Not on file    Minutes per session: Not on file  . Stress: Not on file  Relationships  . Social connections:    Talks on phone: Not on file    Gets together: Not on file    Attends religious service: Not on file    Active member of club or organization: Not on file    Attends meetings of clubs or organizations: Not on file    Relationship status: Not on file  . Intimate partner violence:    Fear of current or ex partner: Not on file    Emotionally abused: Not on file    Physically abused: Not on file    Forced sexual activity: Not on file  Other Topics Concern  . Not on file  Social History Narrative   Pt lives alone in a 1 story home   Has 3 adult daughters   Highest level of education: associates  degree   Worked in Runner, broadcasting/film/video.     REVIEW OF SYSTEMS: Constitutional: No fevers, chills, or sweats, no generalized fatigue, change in appetite Eyes: No visual changes, double vision, eye pain Ear, nose and throat: No hearing loss, ear pain, nasal congestion, sore throat Cardiovascular: No chest pain, palpitations Respiratory:  No shortness of breath at rest or with exertion, wheezes GastrointestinaI: No nausea, vomiting, diarrhea, abdominal pain, fecal incontinence Genitourinary:  No dysuria, urinary retention or frequency Musculoskeletal:  No neck pain,+ back pain Integumentary: No rash, pruritus, skin lesions Neurological: as above Psychiatric: No depression, insomnia, anxiety Endocrine: No palpitations, fatigue, diaphoresis, mood swings, change in appetite, change in weight, increased thirst Hematologic/Lymphatic:  No anemia, purpura, petechiae. Allergic/Immunologic: no itchy/runny eyes, nasal congestion, recent allergic reactions, rashes  PHYSICAL EXAM: Vitals:   01/07/18 1300  BP: (!) 178/70  Pulse: 76  SpO2: 96%   General: No acute distress Head:  Normocephalic/atraumatic Neck: supple, no paraspinal tenderness, full range of motion Heart:  Regular rate and rhythm Lungs:  Clear to auscultation bilaterally Back: No paraspinal tenderness Skin/Extremities: No rash, no edema Neurological Exam: alert and oriented to person, place, and time. No aphasia or dysarthria. Fund of knowledge is appropriate.  Recent and remote memory are intact.  Attention and concentration are normal.    Able to name objects and repeat phrases. Cranial nerves: Pupils equal, round, reactive to light.  Extraocular movements intact with no nystagmus. Visual fields full. Facial sensation intact. No facial asymmetry. Tongue, uvula, palate midline.  Motor: Bulk and tone normal, muscle strength 5/5 throughout with no pronator drift.  Sensation: decreased pin to right mid-calf, intact to pin on left LE,  decreased cold to ankles bilaterally, decreased vibration sense to right knee, left ankle. Deep tendon reflexes 2+ except for absent ankle jerks bilaterally, toes downgoing.  Finger to nose testing intact.  Gait slow and cautious without  cane, no ataxia. Romberg positive sway.  IMPRESSION: This is a very pleasant 67 yo RH man with a history of  AL amyloidosis with kidney involvement and nephrotic range proteinuria. He has had peripheral neuropathy from previous Velcade/vitamin B12 deficiency/hemodialysis and reported worsening of symptoms. His EMG/ NCV confirmed severe bilateral neuropathy, with superimposed bilateral L3-4 radiculopathy, R>L. He is reporting more symptoms suggestive of muscle spasms, possibly due to radiculopathy. He is now agreeable to physical therapy. Increase gabapentin to 300mg  in AM, 600mg  in PM regularly. We may increase dose to 900mg  qhs but would not increase further due to renal issues. We may add amitriptyline back on. He is agreeable to start flexeril for the muscle spasms, side effects discussed. He will follow-up in 6 months and knows to call for any changes.   Thank you for allowing me to participate in his care.  Please do not hesitate to call for any questions or concerns.  The duration of this appointment visit was 30 minutes of face-to-face time with the patient.  Greater than 50% of this time was spent in counseling, explanation of diagnosis, planning of further management, and coordination of care.   Ellouise Newer, M.D.   CC: Dr. Adrian Blackwater, Dr. [name]

## 2018-01-08 DIAGNOSIS — D509 Iron deficiency anemia, unspecified: Secondary | ICD-10-CM | POA: Diagnosis not present

## 2018-01-08 DIAGNOSIS — N2581 Secondary hyperparathyroidism of renal origin: Secondary | ICD-10-CM | POA: Diagnosis not present

## 2018-01-08 DIAGNOSIS — D631 Anemia in chronic kidney disease: Secondary | ICD-10-CM | POA: Diagnosis not present

## 2018-01-08 DIAGNOSIS — N186 End stage renal disease: Secondary | ICD-10-CM | POA: Diagnosis not present

## 2018-01-09 DIAGNOSIS — M25671 Stiffness of right ankle, not elsewhere classified: Secondary | ICD-10-CM | POA: Diagnosis not present

## 2018-01-09 DIAGNOSIS — M25572 Pain in left ankle and joints of left foot: Secondary | ICD-10-CM | POA: Diagnosis not present

## 2018-01-09 DIAGNOSIS — M25571 Pain in right ankle and joints of right foot: Secondary | ICD-10-CM | POA: Diagnosis not present

## 2018-01-09 DIAGNOSIS — M25672 Stiffness of left ankle, not elsewhere classified: Secondary | ICD-10-CM | POA: Diagnosis not present

## 2018-01-10 DIAGNOSIS — D509 Iron deficiency anemia, unspecified: Secondary | ICD-10-CM | POA: Diagnosis not present

## 2018-01-10 DIAGNOSIS — N186 End stage renal disease: Secondary | ICD-10-CM | POA: Diagnosis not present

## 2018-01-10 DIAGNOSIS — I129 Hypertensive chronic kidney disease with stage 1 through stage 4 chronic kidney disease, or unspecified chronic kidney disease: Secondary | ICD-10-CM | POA: Diagnosis not present

## 2018-01-10 DIAGNOSIS — N2581 Secondary hyperparathyroidism of renal origin: Secondary | ICD-10-CM | POA: Diagnosis not present

## 2018-01-10 DIAGNOSIS — Z992 Dependence on renal dialysis: Secondary | ICD-10-CM | POA: Diagnosis not present

## 2018-01-13 DIAGNOSIS — N186 End stage renal disease: Secondary | ICD-10-CM | POA: Diagnosis not present

## 2018-01-13 DIAGNOSIS — D509 Iron deficiency anemia, unspecified: Secondary | ICD-10-CM | POA: Diagnosis not present

## 2018-01-13 DIAGNOSIS — N2581 Secondary hyperparathyroidism of renal origin: Secondary | ICD-10-CM | POA: Diagnosis not present

## 2018-01-15 DIAGNOSIS — N2581 Secondary hyperparathyroidism of renal origin: Secondary | ICD-10-CM | POA: Diagnosis not present

## 2018-01-15 DIAGNOSIS — N186 End stage renal disease: Secondary | ICD-10-CM | POA: Diagnosis not present

## 2018-01-15 DIAGNOSIS — D509 Iron deficiency anemia, unspecified: Secondary | ICD-10-CM | POA: Diagnosis not present

## 2018-01-17 DIAGNOSIS — D509 Iron deficiency anemia, unspecified: Secondary | ICD-10-CM | POA: Diagnosis not present

## 2018-01-17 DIAGNOSIS — N2581 Secondary hyperparathyroidism of renal origin: Secondary | ICD-10-CM | POA: Diagnosis not present

## 2018-01-17 DIAGNOSIS — N186 End stage renal disease: Secondary | ICD-10-CM | POA: Diagnosis not present

## 2018-01-20 DIAGNOSIS — N186 End stage renal disease: Secondary | ICD-10-CM | POA: Diagnosis not present

## 2018-01-20 DIAGNOSIS — D509 Iron deficiency anemia, unspecified: Secondary | ICD-10-CM | POA: Diagnosis not present

## 2018-01-20 DIAGNOSIS — N2581 Secondary hyperparathyroidism of renal origin: Secondary | ICD-10-CM | POA: Diagnosis not present

## 2018-01-21 ENCOUNTER — Other Ambulatory Visit: Payer: Self-pay

## 2018-01-21 ENCOUNTER — Encounter: Payer: Self-pay | Admitting: Physical Therapy

## 2018-01-21 ENCOUNTER — Ambulatory Visit: Payer: Medicare Other | Attending: Neurology | Admitting: Physical Therapy

## 2018-01-21 DIAGNOSIS — M6281 Muscle weakness (generalized): Secondary | ICD-10-CM | POA: Diagnosis not present

## 2018-01-21 DIAGNOSIS — R262 Difficulty in walking, not elsewhere classified: Secondary | ICD-10-CM | POA: Insufficient documentation

## 2018-01-21 DIAGNOSIS — G8929 Other chronic pain: Secondary | ICD-10-CM | POA: Diagnosis not present

## 2018-01-21 DIAGNOSIS — M545 Low back pain: Secondary | ICD-10-CM | POA: Insufficient documentation

## 2018-01-21 NOTE — Therapy (Signed)
[place], [place], [address] Phone: [phone]   Fax:  [phone]  Physical Therapy Evaluation  Patient Details  Name: Jose Garrett MRN: [id number] Date of Birth: 1950/10/09 Referring Provider (PT): Cameron Sprang, MD   Encounter Date: 01/21/2018  PT End of Session - 01/21/18 1155    Visit Number  1    Number of Visits  9    Date for PT Re-Evaluation  02/21/18    Authorization Type  Medicare   PN at visit 10, KX at visit 15    PT Start Time  1150    PT Stop Time  1230    PT Time Calculation (min)  40 min    Activity Tolerance  Patient tolerated treatment well    Behavior During Therapy  [facility] for tasks assessed/performed       Past Medical History:  Diagnosis Date  . Amyloidosis ([place])   . Anemia Dx 2016  . CAD (coronary artery disease)    a. cardiac cath on 11/25/17 with occlusion of the RCA which could have been his event but the RCA could not be engaged. The RCA filled distally from left to right collaterals. There were no severe stenoses in the left coronary system. Medical therapy recommended, started on plavix.  . Chronic combined systolic and diastolic CHF (congestive heart failure) ([place])    a. EF dropped to 35-40% with grade 2 DD by echo 11/2017.  [name] Diarrhea   . ESRD (end stage renal disease) ([place])   . Family history of adverse reaction to anesthesia    "my daughter can't take certain anesthesia agents" (09/08/2014)  . GERD (gastroesophageal reflux disease)   . History of blood transfusion 09/08/2014   "got hematoma after renal biopsy & HgB dropped"  . Hyperlipidemia Dx 2012  . Hypertension Dx 2012  . Restless legs     Past Surgical History:  Procedure Laterality Date  . AV FISTULA PLACEMENT Right 12/27/2015   Procedure: Right Arm ARTERIOVENOUS (AV) FISTULA CREATION;  Surgeon: Angelia Mould, MD;  Location: [facility];  Service: Vascular;  Laterality: Right;  . COLONOSCOPY    . IR  GENERIC HISTORICAL  01/27/2016   IR FLUORO GUIDE CV LINE RIGHT 01/27/2016 Arne Cleveland, MD [facility]  . IR GENERIC HISTORICAL  01/27/2016   IR US GUIDE VASC ACCESS RIGHT 01/27/2016 Arne Cleveland, MD [facility]  . LAPAROSCOPIC CHOLECYSTECTOMY  03/2011  . LEFT HEART CATH AND CORONARY ANGIOGRAPHY N/A 11/25/2017   Procedure: LEFT HEART CATH AND CORONARY ANGIOGRAPHY;  Surgeon: Martinique, Peter M, MD;  Location: [facility];  Service: Cardiovascular;  Laterality: N/A;  . RENAL BIOPSY, PERCUTANEOUS Right 09/08/2014  . TONSILLECTOMY  ~ 1960    There were no vitals filed for this visit.   Subjective Assessment - 01/21/18 1156    Subjective  Right now it is not bothering me. Had NCV study for neuropathy. Was told I have a couple of degenerative disks. They just fire up when they feel like it. Went through therapy about 1.5 yr ago for episode that locked me up. I now have spasms in my legs that began a couple of months ago and physically cause my legs to move. Use a cane for balance. I run out of "gas" pretty quick. Walking is the extent of my exercise right now- walk [facility] and [employer] a couple of times/week.     Patient Stated Goals  understand difference neuropathy vs back  Currently in Pain?  Yes    Pain Score  4     Pain Location  Back    Pain Orientation  Lower;Right;Left   typically always on right   Pain Descriptors / Indicators  Tightness    Aggravating Factors   being still    Pain Relieving Factors  better posture, move around         [facility] PT Assessment - 01/21/18 0001      Assessment   Medical Diagnosis  lumbar radiculopathy    Referring Provider (PT)  Cameron Sprang, MD    Onset Date/Surgical Date  --   chronic with recent increase in distal symptoms    Hand Dominance  Left    Next MD Visit  --   December   Prior Therapy  about 1.5 years ago      Precautions   Precaution Comments  on dialysis      Restrictions   Weight Bearing Restrictions  No       Balance Screen   Has the patient fallen in the past 6 months  No    Has the patient had a decrease in activity level because of a fear of falling?   Yes    Is the patient reluctant to leave their home because of a fear of falling?   No      Home Film/video editor residence    Additional Comments  no stairs at home      Prior Function   Level of [place]  Retired      Associate Professor   Overall Cognitive Status  Within Functional Limits for tasks assessed      Observation/Other Assessments   Focus on Therapeutic Outcomes (FOTO)   59% limitation      Sensation   Additional Comments  decreased sensation in bilateral feet      Posture/Postural Control   Posture Comments  increased thoracic kyphosis, decreased lumbar lordosis & post pelvic tilt; lack of extension in left knee      ROM / Strength   AROM / PROM / Strength  Strength      Strength   Strength Assessment Site  Hip    Right/Left Hip  --   bil hips gross 4/5     Flexibility   Soft Tissue Assessment /Muscle Length  --   tightness in hamstrings               Objective measurements completed on examination: See above findings.      [facility] Adult PT Treatment/Exercise - 01/21/18 0001      Exercises   Exercises  Lumbar      Lumbar Exercises: Stretches   Lower Trunk Rotation  3 reps   each   Other Lumbar Stretch Exercise  seated HSS      Lumbar Exercises: Seated   LAQ on Chair Limitations  LAQ with ball bw knees             PT Education - 01/21/18 1250    Education Details  anatomy of condition, POC, HEP, exercise form/rationale, pillow for lumbar support    Person(s) Educated  Patient    Methods  Explanation;Demonstration;Tactile cues;Verbal cues;Handout    Comprehension  Verbalized understanding;Returned demonstration;Verbal cues required;Tactile cues required;Need further instruction          PT Long Term Goals - 01/21/18 1238      PT  LONG TERM GOAL #1  Title  Gross LE strength 5/5    Baseline  gross 4/5 at eval    Time  4    Period  Weeks    Status  New    [place] Date  02/21/18      PT LONG TERM GOAL #2   Title  Pt will be able to sit comfortably with use of posture and pillows    Baseline  began educating at eval    Time  4    Period  Weeks    Status  New    [place] Date  02/21/18      PT LONG TERM GOAL #3   Title  Pt will be able to utilize stretches and posture to decrease spasm and back pain    Baseline  will progress exercises and stretches as appropriate    Time  4    Period  Weeks    Status  New    [place] Date  02/21/18      PT LONG TERM GOAL #4   Title  Pt will verbalize understanding of pain coming from neuropathy/lumbar     Baseline  will educate    Time  4    Period  Weeks    Status  New    [place] Date  02/21/18      PT LONG TERM GOAL #5   Title  FOTO to 43% limitation    Baseline  59% limited at eval    Time  4    Period  Weeks    Status  New    [place] Date  02/21/18             Plan - 01/21/18 1155    Clinical Impression Statement  Pt presents to PT with complaints of LBP and spasms that have increased in bil LE. Neuropathy present that decreases sensation in feet and pt is unable to fully extend Lt knee. Ambulates with SPC for balance. Poor postural alignment noted along with LE biomechanical chain weakness resulting in poor pressures through spine. pt will benefit from skilled PT in order to improve flexibility, strength and functional posture .    History and Personal Factors relevant to plan of care:  CAD, CHF, ESRD, anemia, neuropathy, on dialysis    Clinical Presentation  Unstable    Clinical Presentation due to:  inconsistent pain onsets, increased distal symptoms    Clinical Decision Making  High    Rehab Potential  Good    PT Frequency  2x / week    PT Duration  4 weeks    PT Treatment/Interventions  ADLs/Self Care Home Management;Cryotherapy;Electrical  Stimulation;Gait training;Moist Heat;Traction;Functional mobility training;Therapeutic activities;Therapeutic exercise;Balance training;Manual techniques;Patient/family education;Passive range of motion;Dry needling;Taping    PT Next Visit Plan  gross stretching, core engagement. STM lumbar paraspinals & QL    PT Home Exercise Plan  LTR, seated HSS, seated LAQ with ball squeeze    Recommended Other Services  nutritional counseling    Consulted and Agree with Plan of Care  Patient       Patient will benefit from skilled therapeutic intervention in order to improve the following deficits and impairments:  Difficulty walking, Increased muscle spasms, Decreased endurance, Decreased activity tolerance, Pain, Improper body mechanics, Impaired flexibility, Decreased balance, Decreased strength, Postural dysfunction  Visit Diagnosis: Chronic bilateral low back pain without sciatica - Plan: PT plan of care cert/re-cert  Muscle weakness (generalized) - Plan: PT plan of care cert/re-cert  Difficulty in walking, not elsewhere classified -  Plan: PT plan of care cert/re-cert     Problem List Patient Active Problem List   Diagnosis Date Noted  . Acute on chronic combined systolic and diastolic CHF (congestive heart failure) ([place]) 11/30/2017  . NSTEMI (non-ST elevated myocardial infarction) ([place])   . Elevated troponin 11/25/2017  . Respiratory failure ([place])   . Neuropathy 03/14/2017  . Fever   . CKD (chronic kidney disease) stage 5, GFR less than 15 ml/min (H[facility])   . ESRD (end stage renal disease) ([place])   . Acute renal failure superimposed on chronic kidney disease ([place])   . Hypervolemia   . Acute diastolic (congestive) heart failure ([place]) 01/23/2016  . Fluid overload, unspecified 01/23/2016  . Acute pulmonary edema ([place]) 01/23/2016  . Skin tear of right forearm without complication 85/27/7824  . Iron deficiency anemia 11/22/2015  . Mild aortic stenosis 08/22/2015  . Chronic kidney disease with  dialysis modality undecided, stage 5 ([place]): Progressive 08/01/2015  . B12 deficiency 07/19/2015  . Seasonal allergies 06/30/2015  . Back pain 03/18/2015  . Hip pain 02/18/2015  . AL amyloidosis ([place]) 09/25/2014  . Anemia of chronic disease 09/25/2014  . Abnormal bruising 09/25/2014  . Renal hematoma, right 09/08/2014  . Chronic diastolic heart failure, NYHA class 1 ([place]) 09/08/2014  . Hematuria   . Proteinuria   . Vitamin D deficiency 01/21/2014  . Hypertriglyceridemia 01/21/2014  . Essential hypertension, benign 11/06/2013  . Dependent edema 11/06/2013  . DOE (dyspnea on exertion) 11/06/2013  . Other malaise and fatigue 11/06/2013    Danique Hartsough C. Dejon Lukas PT, DPT 01/21/18 12:53 PM   [facility] [address] [place], [place], [address] Phone: [phone]   Fax:  [phone]  Name: ARIEL WINGROVE MRN: [id number] Date of Birth: Nov 24, 1950

## 2018-01-22 DIAGNOSIS — N2581 Secondary hyperparathyroidism of renal origin: Secondary | ICD-10-CM | POA: Diagnosis not present

## 2018-01-22 DIAGNOSIS — D509 Iron deficiency anemia, unspecified: Secondary | ICD-10-CM | POA: Diagnosis not present

## 2018-01-22 DIAGNOSIS — N186 End stage renal disease: Secondary | ICD-10-CM | POA: Diagnosis not present

## 2018-01-23 ENCOUNTER — Ambulatory Visit: Payer: Medicare Other | Admitting: Physical Therapy

## 2018-01-23 ENCOUNTER — Encounter: Payer: Self-pay | Admitting: Physical Therapy

## 2018-01-23 DIAGNOSIS — M545 Low back pain: Principal | ICD-10-CM

## 2018-01-23 DIAGNOSIS — M25671 Stiffness of right ankle, not elsewhere classified: Secondary | ICD-10-CM | POA: Diagnosis not present

## 2018-01-23 DIAGNOSIS — R262 Difficulty in walking, not elsewhere classified: Secondary | ICD-10-CM

## 2018-01-23 DIAGNOSIS — M25672 Stiffness of left ankle, not elsewhere classified: Secondary | ICD-10-CM | POA: Diagnosis not present

## 2018-01-23 DIAGNOSIS — M6281 Muscle weakness (generalized): Secondary | ICD-10-CM | POA: Diagnosis not present

## 2018-01-23 DIAGNOSIS — G8929 Other chronic pain: Secondary | ICD-10-CM | POA: Diagnosis not present

## 2018-01-23 DIAGNOSIS — M25572 Pain in left ankle and joints of left foot: Secondary | ICD-10-CM | POA: Diagnosis not present

## 2018-01-23 DIAGNOSIS — M25571 Pain in right ankle and joints of right foot: Secondary | ICD-10-CM | POA: Diagnosis not present

## 2018-01-23 NOTE — Therapy (Signed)
[place], [place], [address] Phone: [phone]   Fax:  [phone]  Physical Therapy Treatment  Patient Details  Name: Jose Garrett MRN: [id number] Date of Birth: [birth year] Referring Provider (PT): Cameron Sprang, MD   Encounter Date: 01/23/2018  PT End of Session - 01/23/18 1044    Visit Number  2    Number of Visits  9    Date for PT Re-Evaluation  02/21/18    Authorization Type  Medicare   PN at visit 10, KX at visit 15    PT Start Time  1019    PT Stop Time  1104    PT Time Calculation (min)  45 min    Activity Tolerance  Patient tolerated treatment well    Behavior During Therapy  [facility] for tasks assessed/performed       Past Medical History:  Diagnosis Date  . Amyloidosis ([place])   . Anemia Dx 2016  . CAD (coronary artery disease)    a. cardiac cath on 11/25/17 with occlusion of the RCA which could have been his event but the RCA could not be engaged. The RCA filled distally from left to right collaterals. There were no severe stenoses in the left coronary system. Medical therapy recommended, started on plavix.  . Chronic combined systolic and diastolic CHF (congestive heart failure) ([place])    a. EF dropped to 35-40% with grade 2 DD by echo 11/2017.  [name] Diarrhea   . ESRD (end stage renal disease) ([place])   . Family history of adverse reaction to anesthesia    "my daughter can't take certain anesthesia agents" (09/08/2014)  . GERD (gastroesophageal reflux disease)   . History of blood transfusion 09/08/2014   "got hematoma after renal biopsy & HgB dropped"  . Hyperlipidemia Dx 2012  . Hypertension Dx 2012  . Restless legs     Past Surgical History:  Procedure Laterality Date  . AV FISTULA PLACEMENT Right 12/27/2015   Procedure: Right Arm ARTERIOVENOUS (AV) FISTULA CREATION;  Surgeon: Angelia Mould, MD;  Location: [place] [facility];  Service: Vascular;  Laterality: Right;  . COLONOSCOPY    . IR  GENERIC HISTORICAL  01/27/2016   IR FLUORO GUIDE CV LINE RIGHT 01/27/2016 Arne Cleveland, MD [facility]INTER[facility] RAD  . IR GENERIC HISTORICAL  01/27/2016   IR US GUIDE VASC ACCESS RIGHT 01/27/2016 Arne Cleveland, MD [facility]INTER[facility] RAD  . LAPAROSCOPIC CHOLECYSTECTOMY  03/2011  . LEFT HEART CATH AND CORONARY ANGIOGRAPHY N/A 11/25/2017   Procedure: LEFT HEART CATH AND CORONARY ANGIOGRAPHY;  Surgeon: Martinique, Peter M, MD;  Location: [place] CV LAB;  Service: Cardiovascular;  Laterality: N/A;  . RENAL BIOPSY, PERCUTANEOUS Right 09/08/2014  . TONSILLECTOMY  ~ 1960    There were no vitals filed for this visit.  Subjective Assessment - 01/23/18 1038    Subjective  Pt arriving to therapy reporting 3/10 low back pain. Pt reported he has not been able to do any of his exercises since his last visit.     Patient Stated Goals  understand difference neuropathy vs back    Currently in Pain?  Yes    Pain Score  3     Pain Location  Back    Pain Orientation  Lower    Pain Descriptors / Indicators  Aching;Tightness    Pain Type  Chronic pain    Pain Onset  More than a month ago    Pain Frequency  Intermittent    Aggravating  Factors   it stiffness up after I've been still for a while, bending, lifting    Pain Relieving Factors  better posture, move around, change positions                       [facility] Adult PT Treatment/Exercise - 01/23/18 0001      Exercises   Exercises  Lumbar      Lumbar Exercises: Stretches   Single Knee to Chest Stretch  3 reps;20 seconds    Lower Trunk Rotation  3 reps   each   Other Lumbar Stretch Exercise  seated HSS   using green strap     Lumbar Exercises: Seated   Other Seated Lumbar Exercises  hip abduciton with green theraband x 10 reps x 2 sets      Lumbar Exercises: Supine   Bridge  10 reps;5 seconds    Straight Leg Raise  10 reps    Other Supine Lumbar Exercises  ball squeezes x 10 holding 5 seconds      Modalities   Modalities  Moist Heat      Moist  Heat Therapy   Number Minutes Moist Heat  10 Minutes    Moist Heat Location  Lumbar Spine      Manual Therapy   Manual Therapy  Soft tissue mobilization    Manual therapy comments  x 8 minutes    Soft tissue mobilization  IASTM lumbar paraspinals and r QL             PT Education - 01/23/18 1043    Education Details  HEP    Person(s) Educated  Patient    Methods  Explanation;Demonstration    Comprehension  Verbalized understanding;Returned demonstration          PT Long Term Goals - 01/23/18 1047      PT LONG TERM GOAL #1   Title  Gross LE strength 5/5    Baseline  gross 4/5 at eval    Time  4    Period  Weeks    Status  On-going      PT LONG TERM GOAL #2   Title  Pt will be able to sit comfortably with use of posture and pillows    Baseline  began educating at eval    Time  4    Period  Weeks    Status  On-going      PT LONG TERM GOAL #3   Title  Pt will be able to utilize stretches and posture to decrease spasm and back pain    Baseline  will progress exercises and stretches as appropriate    Time  4    Period  Weeks    Status  New      PT LONG TERM GOAL #4   Title  Pt will verbalize understanding of pain coming from neuropathy/lumbar     Baseline  will educate    Time  4    Period  Weeks    Status  New      PT LONG TERM GOAL #5   Title  FOTO to 43% limitation    Baseline  59% limited at eval    Time  4    Period  Weeks    Status  New            Plan - 01/23/18 1045    Clinical Impression Statement  Pt arriving to therapy with 3/10 low back pain bilaterally. Pt  tolerating all exericses well. Continue with progress pt's core strength, lumbar ROM, and bilateral hip strengthening with skilled PT.     Rehab Potential  Good    PT Frequency  2x / week    PT Duration  4 weeks    PT Treatment/Interventions  ADLs/Self Care Home Management;Cryotherapy;Electrical Stimulation;Gait training;Moist Heat;Traction;Functional mobility training;Therapeutic  activities;Therapeutic exercise;Balance training;Manual techniques;Patient/family education;Passive range of motion;Dry needling;Taping    PT Next Visit Plan  gross stretching, core engagement. STM lumbar paraspinals & QL    PT Home Exercise Plan  LTR, seated HSS, seated LAQ with ball squeeze,     Consulted and Agree with Plan of Care  Patient       Patient will benefit from skilled therapeutic intervention in order to improve the following deficits and impairments:  Difficulty walking, Increased muscle spasms, Decreased endurance, Decreased activity tolerance, Pain, Improper body mechanics, Impaired flexibility, Decreased balance, Decreased strength, Postural dysfunction  Visit Diagnosis: Chronic bilateral low back pain without sciatica  Muscle weakness (generalized)  Difficulty in walking, not elsewhere classified     Problem List Patient Active Problem List   Diagnosis Date Noted  . Acute on chronic combined systolic and diastolic CHF (congestive heart failure) ([place]) 11/30/2017  . NSTEMI (non-ST elevated myocardial infarction) ([place])   . Elevated troponin 11/25/2017  . Respiratory failure ([place])   . Neuropathy 03/14/2017  . Fever   . CKD (chronic kidney disease) stage 5, GFR less than 15 ml/min (HCC)   . ESRD (end stage renal disease) ([place])   . Acute renal failure superimposed on chronic kidney disease ([place])   . Hypervolemia   . Acute diastolic (congestive) heart failure ([place]) 01/23/2016  . Fluid overload, unspecified 01/23/2016  . Acute pulmonary edema ([place]) 01/23/2016  . Skin tear of right forearm without complication 02/77/4128  . Iron deficiency anemia 11/22/2015  . Mild aortic stenosis 08/22/2015  . Chronic kidney disease with dialysis modality undecided, stage 5 ([place]): Progressive 08/01/2015  . B12 deficiency 07/19/2015  . Seasonal allergies 06/30/2015  . Back pain 03/18/2015  . Hip pain 02/18/2015  . AL amyloidosis ([place]) 09/25/2014  . Anemia of chronic disease  09/25/2014  . Abnormal bruising 09/25/2014  . Renal hematoma, right 09/08/2014  . Chronic diastolic heart failure, NYHA class 1 ([place]) 09/08/2014  . Hematuria   . Proteinuria   . Vitamin D deficiency 01/21/2014  . Hypertriglyceridemia 01/21/2014  . Essential hypertension, benign 11/06/2013  . Dependent edema 11/06/2013  . DOE (dyspnea on exertion) 11/06/2013  . Other malaise and fatigue 11/06/2013    Oretha Caprice, PT 01/23/2018, 11:02 AM  [facility] [address] [place], [place], [address] Phone: [phone]   Fax:  [phone]  Name: STANELY SEXSON MRN: [id number] Date of Birth: May 12, 1950

## 2018-01-24 DIAGNOSIS — N186 End stage renal disease: Secondary | ICD-10-CM | POA: Diagnosis not present

## 2018-01-24 DIAGNOSIS — N2581 Secondary hyperparathyroidism of renal origin: Secondary | ICD-10-CM | POA: Diagnosis not present

## 2018-01-24 DIAGNOSIS — D509 Iron deficiency anemia, unspecified: Secondary | ICD-10-CM | POA: Diagnosis not present

## 2018-01-27 DIAGNOSIS — D509 Iron deficiency anemia, unspecified: Secondary | ICD-10-CM | POA: Diagnosis not present

## 2018-01-27 DIAGNOSIS — N2581 Secondary hyperparathyroidism of renal origin: Secondary | ICD-10-CM | POA: Diagnosis not present

## 2018-01-27 DIAGNOSIS — N186 End stage renal disease: Secondary | ICD-10-CM | POA: Diagnosis not present

## 2018-01-28 ENCOUNTER — Encounter: Payer: Self-pay | Admitting: Physical Therapy

## 2018-01-28 ENCOUNTER — Ambulatory Visit: Payer: Medicare Other | Admitting: Physical Therapy

## 2018-01-28 DIAGNOSIS — M25571 Pain in right ankle and joints of right foot: Secondary | ICD-10-CM | POA: Diagnosis not present

## 2018-01-28 DIAGNOSIS — R262 Difficulty in walking, not elsewhere classified: Secondary | ICD-10-CM

## 2018-01-28 DIAGNOSIS — M545 Low back pain, unspecified: Secondary | ICD-10-CM

## 2018-01-28 DIAGNOSIS — M25572 Pain in left ankle and joints of left foot: Secondary | ICD-10-CM | POA: Diagnosis not present

## 2018-01-28 DIAGNOSIS — G8929 Other chronic pain: Secondary | ICD-10-CM

## 2018-01-28 DIAGNOSIS — M6281 Muscle weakness (generalized): Secondary | ICD-10-CM | POA: Diagnosis not present

## 2018-01-28 DIAGNOSIS — M25672 Stiffness of left ankle, not elsewhere classified: Secondary | ICD-10-CM | POA: Diagnosis not present

## 2018-01-28 DIAGNOSIS — M25671 Stiffness of right ankle, not elsewhere classified: Secondary | ICD-10-CM | POA: Diagnosis not present

## 2018-01-28 NOTE — Therapy (Signed)
[place], [place], [address] Phone: [phone]   Fax:  [phone]  Physical Therapy Treatment  Patient Details  Name: Jose Garrett MRN: [id number] Date of Birth: 09-25-1950 Referring Provider (PT): Cameron Sprang, MD   Encounter Date: 01/28/2018  PT End of Session - 01/28/18 1302    Visit Number  3    Number of Visits  9    Date for PT Re-Evaluation  02/21/18    Authorization Type  Medicare   PN at visit 10, KX at visit 15    PT Start Time  1200   short session due to patient late    PT Stop Time  1235    PT Time Calculation (min)  35 min    Activity Tolerance  Patient tolerated treatment well    Behavior During Therapy  [facility] for tasks assessed/performed       Past Medical History:  Diagnosis Date  . Amyloidosis ([place])   . Anemia Dx 2016  . CAD (coronary artery disease)    a. cardiac cath on 11/25/17 with occlusion of the RCA which could have been his event but the RCA could not be engaged. The RCA filled distally from left to right collaterals. There were no severe stenoses in the left coronary system. Medical therapy recommended, started on plavix.  . Chronic combined systolic and diastolic CHF (congestive heart failure) ([place])    a. EF dropped to 35-40% with grade 2 DD by echo 11/2017.  [name] Diarrhea   . ESRD (end stage renal disease) ([place])   . Family history of adverse reaction to anesthesia    "my daughter can't take certain anesthesia agents" (09/08/2014)  . GERD (gastroesophageal reflux disease)   . History of blood transfusion 09/08/2014   "got hematoma after renal biopsy & HgB dropped"  . Hyperlipidemia Dx 2012  . Hypertension Dx 2012  . Restless legs     Past Surgical History:  Procedure Laterality Date  . AV FISTULA PLACEMENT Right 12/27/2015   Procedure: Right Arm ARTERIOVENOUS (AV) FISTULA CREATION;  Surgeon: Angelia Mould, MD;  Location: [facility];  Service: Vascular;   Laterality: Right;  . COLONOSCOPY    . IR GENERIC HISTORICAL  01/27/2016   IR FLUORO GUIDE CV LINE RIGHT 01/27/2016 Arne Cleveland, MD [facility]INTER[facility]  . IR GENERIC HISTORICAL  01/27/2016   IR US GUIDE VASC ACCESS RIGHT 01/27/2016 Arne Cleveland, MD [facility]  . LAPAROSCOPIC CHOLECYSTECTOMY  03/2011  . LEFT HEART CATH AND CORONARY ANGIOGRAPHY N/A 11/25/2017   Procedure: LEFT HEART CATH AND CORONARY ANGIOGRAPHY;  Surgeon: Martinique, Peter M, MD;  Location: [place] CV LAB;  Service: Cardiovascular;  Laterality: N/A;  . RENAL BIOPSY, PERCUTANEOUS Right 09/08/2014  . TONSILLECTOMY  ~ 1960    There were no vitals filed for this visit.  Subjective Assessment - 01/28/18 1202    Subjective  For 2 weeks has tried muscle relaxers for pain  ( not regular)   has not noticed relief yet.  i cannot exercise 3 days a week after dialysis.   I get about 1/2 of them done.   I  am new to the routine.      Currently in Pain?  Yes    Pain Score  3     Pain Location  Back    Pain Orientation  Lower    Pain Descriptors / Indicators  Aching;Spasm   fires and in 2 minutes it will go  Pain Type  Chronic pain    Pain Frequency  Intermittent    Aggravating Factors   goes away by itself    Pain Relieving Factors  back pain gets better with waiting.                        [facility] Adult PT Treatment/Exercise - 01/28/18 0001      Lumbar Exercises: Stretches   Passive Hamstring Stretch  20 seconds;3 reps   decreased RPOM   Single Knee to Chest Stretch  3 reps;20 seconds   tighter on right.   Single Knee to Chest Stretch Limitations  3  X 20 seconds    Lower Trunk Rotation  3 reps;30 seconds    Lower Trunk Rotation Limitations  3 X 20 seconds      Lumbar Exercises: Supine   Glut Set  10 reps    Clam  10 reps    Clam Limitations  red band    Bridge  10 reps   a little uncomfortable.  3/10   Large Ball Oblique Isometric Limitations  2 sets , both and single left  cues initially.   not right  only due to Dialysis pain   Other Supine Lumbar Exercises  ball squeezes x 10 holding 5 seconds             PT Education - 01/28/18 1302    Education Details  exercise form    Methods  Explanation;Demonstration;Verbal cues    Comprehension  Verbalized understanding;Returned demonstration          PT Long Term Goals - 01/28/18 1306      PT LONG TERM GOAL #1   Title  Gross LE strength 5/5    Time  4    Period  Weeks    Status  Unable to assess      PT LONG TERM GOAL #2   Title  Pt will be able to sit comfortably with use of posture and pillows    Baseline  sits more comfortably in computer chair with support.    Time  4    Period  Weeks    Status  Partially Met      PT LONG TERM GOAL #3   Title  Pt will be able to utilize stretches and posture to decrease spasm and back pain    Baseline  rest and time assist spasms for now.    Time  4    Period  Weeks    Status  On-going      PT LONG TERM GOAL #4   Title  Pt will verbalize understanding of pain coming from neuropathy/lumbar     Baseline  understands.  he is getting neuropathy treatment  at another clinic.     Time  4    Period  Weeks    Status  On-going      PT LONG TERM GOAL #5   Time  4    Period  Weeks    Status  Unable to assess            Plan - 01/28/18 1303    Clinical Impression Statement  Pain unchanged during session today in back.  he did note right knee now able to touch bed with improved ROM following hamstring stretching.  Stabilization and stretching the focus.  he is not yet consistant with HEp .  he has dialysis 2 x a week and PT 4 x a wee (  2 X at another facility) for e-stim, balance and stretching. ( He has been going there barely over 2 months).     PT Next Visit Plan  gross stretching, core engagement. STM lumbar paraspinals & QL.  Discuss PT at 2 places,  may be expensive.     PT Home Exercise Plan  LTR, seated HSS, seated LAQ with ball squeeze,     Consulted and Agree with Plan  of Care  Patient       Patient will benefit from skilled therapeutic intervention in order to improve the following deficits and impairments:     Visit Diagnosis: Chronic bilateral low back pain without sciatica  Muscle weakness (generalized)  Difficulty in walking, not elsewhere classified     Problem List Patient Active Problem List   Diagnosis Date Noted  . Acute on chronic combined systolic and diastolic CHF (congestive heart failure) ([place]) 11/30/2017  . NSTEMI (non-ST elevated myocardial infarction) ([place])   . Elevated troponin 11/25/2017  . Respiratory failure ([place])   . Neuropathy 03/14/2017  . Fever   . CKD (chronic kidney disease) stage 5, GFR less than 15 ml/min (HCC)   . ESRD (end stage renal disease) ([place])   . Acute renal failure superimposed on chronic kidney disease ([place])   . Hypervolemia   . Acute diastolic (congestive) heart failure ([place]) 01/23/2016  . Fluid overload, unspecified 01/23/2016  . Acute pulmonary edema ([place]) 01/23/2016  . Skin tear of right forearm without complication 37/06/8887  . Iron deficiency anemia 11/22/2015  . Mild aortic stenosis 08/22/2015  . Chronic kidney disease with dialysis modality undecided, stage 5 ([place]): Progressive 08/01/2015  . B12 deficiency 07/19/2015  . Seasonal allergies 06/30/2015  . Back pain 03/18/2015  . Hip pain 02/18/2015  . AL amyloidosis ([place]) 09/25/2014  . Anemia of chronic disease 09/25/2014  . Abnormal bruising 09/25/2014  . Renal hematoma, right 09/08/2014  . Chronic diastolic heart failure, NYHA class 1 ([place]) 09/08/2014  . Hematuria   . Proteinuria   . Vitamin D deficiency 01/21/2014  . Hypertriglyceridemia 01/21/2014  . Essential hypertension, benign 11/06/2013  . Dependent edema 11/06/2013  . DOE (dyspnea on exertion) 11/06/2013  . Other malaise and fatigue 11/06/2013    , PTA 01/28/2018, 1:09 PM  [facility] [address] [place], [place], [address] Phone: [phone]   Fax:  [phone]  Name: NAITIK HERMANN MRN: [id number] Date of Birth: Jan 13, 1951

## 2018-01-29 DIAGNOSIS — N186 End stage renal disease: Secondary | ICD-10-CM | POA: Diagnosis not present

## 2018-01-29 DIAGNOSIS — D509 Iron deficiency anemia, unspecified: Secondary | ICD-10-CM | POA: Diagnosis not present

## 2018-01-29 DIAGNOSIS — N2581 Secondary hyperparathyroidism of renal origin: Secondary | ICD-10-CM | POA: Diagnosis not present

## 2018-01-31 DIAGNOSIS — N186 End stage renal disease: Secondary | ICD-10-CM | POA: Diagnosis not present

## 2018-01-31 DIAGNOSIS — D509 Iron deficiency anemia, unspecified: Secondary | ICD-10-CM | POA: Diagnosis not present

## 2018-01-31 DIAGNOSIS — N2581 Secondary hyperparathyroidism of renal origin: Secondary | ICD-10-CM | POA: Diagnosis not present

## 2018-02-02 DIAGNOSIS — D509 Iron deficiency anemia, unspecified: Secondary | ICD-10-CM | POA: Diagnosis not present

## 2018-02-02 DIAGNOSIS — N186 End stage renal disease: Secondary | ICD-10-CM | POA: Diagnosis not present

## 2018-02-02 DIAGNOSIS — N2581 Secondary hyperparathyroidism of renal origin: Secondary | ICD-10-CM | POA: Diagnosis not present

## 2018-02-04 ENCOUNTER — Ambulatory Visit: Payer: Medicare Other | Admitting: Physical Therapy

## 2018-02-04 ENCOUNTER — Telehealth: Payer: Self-pay | Admitting: Physical Therapy

## 2018-02-04 DIAGNOSIS — D509 Iron deficiency anemia, unspecified: Secondary | ICD-10-CM | POA: Diagnosis not present

## 2018-02-04 DIAGNOSIS — N186 End stage renal disease: Secondary | ICD-10-CM | POA: Diagnosis not present

## 2018-02-04 DIAGNOSIS — N2581 Secondary hyperparathyroidism of renal origin: Secondary | ICD-10-CM | POA: Diagnosis not present

## 2018-02-04 NOTE — Telephone Encounter (Signed)
Pt was called about missed therapy appointment. Pt reported that he got called to come in for a Dialysis treatment due to the holiday schedule and he forgot to call and cancel his PT appointment. He was reminded of his upcoming appointment on 02/11/18 at 11:45.   Kearney Hard, PT

## 2018-02-05 DIAGNOSIS — M25572 Pain in left ankle and joints of left foot: Secondary | ICD-10-CM | POA: Diagnosis not present

## 2018-02-05 DIAGNOSIS — M25671 Stiffness of right ankle, not elsewhere classified: Secondary | ICD-10-CM | POA: Diagnosis not present

## 2018-02-05 DIAGNOSIS — M25672 Stiffness of left ankle, not elsewhere classified: Secondary | ICD-10-CM | POA: Diagnosis not present

## 2018-02-05 DIAGNOSIS — M25571 Pain in right ankle and joints of right foot: Secondary | ICD-10-CM | POA: Diagnosis not present

## 2018-02-07 DIAGNOSIS — N2581 Secondary hyperparathyroidism of renal origin: Secondary | ICD-10-CM | POA: Diagnosis not present

## 2018-02-07 DIAGNOSIS — D509 Iron deficiency anemia, unspecified: Secondary | ICD-10-CM | POA: Diagnosis not present

## 2018-02-07 DIAGNOSIS — N186 End stage renal disease: Secondary | ICD-10-CM | POA: Diagnosis not present

## 2018-02-09 DIAGNOSIS — I129 Hypertensive chronic kidney disease with stage 1 through stage 4 chronic kidney disease, or unspecified chronic kidney disease: Secondary | ICD-10-CM | POA: Diagnosis not present

## 2018-02-09 DIAGNOSIS — Z992 Dependence on renal dialysis: Secondary | ICD-10-CM | POA: Diagnosis not present

## 2018-02-09 DIAGNOSIS — N186 End stage renal disease: Secondary | ICD-10-CM | POA: Diagnosis not present

## 2018-02-10 DIAGNOSIS — N2581 Secondary hyperparathyroidism of renal origin: Secondary | ICD-10-CM | POA: Diagnosis not present

## 2018-02-10 DIAGNOSIS — D509 Iron deficiency anemia, unspecified: Secondary | ICD-10-CM | POA: Diagnosis not present

## 2018-02-10 DIAGNOSIS — N186 End stage renal disease: Secondary | ICD-10-CM | POA: Diagnosis not present

## 2018-02-11 ENCOUNTER — Ambulatory Visit: Payer: Medicare Other | Attending: Neurology | Admitting: Physical Therapy

## 2018-02-11 ENCOUNTER — Encounter: Payer: Self-pay | Admitting: Physical Therapy

## 2018-02-11 DIAGNOSIS — R262 Difficulty in walking, not elsewhere classified: Secondary | ICD-10-CM

## 2018-02-11 DIAGNOSIS — M545 Low back pain: Secondary | ICD-10-CM | POA: Diagnosis not present

## 2018-02-11 DIAGNOSIS — M6281 Muscle weakness (generalized): Secondary | ICD-10-CM | POA: Diagnosis not present

## 2018-02-11 DIAGNOSIS — G8929 Other chronic pain: Secondary | ICD-10-CM | POA: Insufficient documentation

## 2018-02-11 NOTE — Progress Notes (Signed)
Chief Complaint  Patient presents with  . Follow-up    CAD   History of Present Illness: 67 yo male with history of ESRD on HD, AL amyloid (kidney involvement), aortic stenosis, HTN, HLD, GERD, anemia, chronic combined CHF and CAD here today for cardiac follow up. He was admitted to [facility] September 2019 with acute respiratory failure in setting of hypertensive urgency. His troponin was elevated and his EKG showed diffuse ST depression. Cardiac cath 11/25/17 with occlusion of the RCA which was felt to be the culprit but this vessel could not be engaged. The distal RCA filled from collaterals. There was moderate disease in the ostial Circumflex and mild disease in the LAD. Medical therapy was recommended. LVEF=35-40%. There was moderate aortic stenosis. Echo on 12/19/17 with LVEF=65-70%. Moderate AS (mean gradient 31 mmHg). He has been off of Coreg and Ace-inh due to hypotension in dialysis. His amyloid is followed by oncology whose notes indicate prior tx with CyBorD, Velcade, Ixazomib andDexamethasone. Bone marrow with no overt evidence of multiple myeloma. PET/CT scan showed no evidence of bony lesions or lymphadenopathy. He had neuropathy with some of his treatment measures so is no longer on chemotherapy.Last oncology note indicates serologic remission.   He is here today for follow up. The patient denies any chest pain, dyspnea, palpitations, lower extremity edema, orthopnea, PND, dizziness, near syncope or syncope.   Primary Care Physician: [name], MD  Past Medical History:  Diagnosis Date  . Amyloidosis ([place])   . Anemia Dx 2016  . CAD (coronary artery disease)    a. cardiac cath on 11/25/17 with occlusion of the RCA which could have been his event but the RCA could not be engaged. The RCA filled distally from left to right collaterals. There were no severe stenoses in the left coronary system. Medical therapy recommended, started on plavix.  . Chronic combined systolic and  diastolic CHF (congestive heart failure) ([place])    a. EF dropped to 35-40% with grade 2 DD by echo 11/2017.  [name] Diarrhea   . ESRD (end stage renal disease) ([place])   . Family history of adverse reaction to anesthesia    "my daughter can't take certain anesthesia agents" (09/08/2014)  . GERD (gastroesophageal reflux disease)   . History of blood transfusion 09/08/2014   "got hematoma after renal biopsy & HgB dropped"  . Hyperlipidemia Dx 2012  . Hypertension Dx 2012  . Restless legs     Past Surgical History:  Procedure Laterality Date  . AV FISTULA PLACEMENT Right 12/27/2015   Procedure: Right Arm ARTERIOVENOUS (AV) FISTULA CREATION;  Surgeon: Angelia Mould, MD;  Location: [facility];  Service: Vascular;  Laterality: Right;  . COLONOSCOPY    . IR GENERIC HISTORICAL  01/27/2016   IR FLUORO GUIDE CV LINE RIGHT 01/27/2016 Arne Cleveland, MD [facility]  . IR GENERIC HISTORICAL  01/27/2016   IR US GUIDE VASC ACCESS RIGHT 01/27/2016 Arne Cleveland, MD [facility]  . LAPAROSCOPIC CHOLECYSTECTOMY  03/2011  . LEFT HEART CATH AND CORONARY ANGIOGRAPHY N/A 11/25/2017   Procedure: LEFT HEART CATH AND CORONARY ANGIOGRAPHY;  Surgeon: Martinique, Peter M, MD;  Location: [facility];  Service: Cardiovascular;  Laterality: N/A;  . RENAL BIOPSY, PERCUTANEOUS Right 09/08/2014  . TONSILLECTOMY  ~ 1960    Current Outpatient Medications  Medication Sig Dispense Refill  . acetaminophen (TYLENOL) 325 MG tablet Take 325-650 mg by mouth every 6 (six) hours as needed (back pain).    [name] aspirin EC  81 MG tablet Take 1 tablet (81 mg total) by mouth daily. 30 tablet 11  . atorvastatin (LIPITOR) 80 MG tablet Take 80 mg by mouth daily.    . B Complex-C-Folic Acid (DIALYVITE 151) 0.8 MG TABS Take 0.8 mg by mouth daily.    . Cholecalciferol (VITAMIN D3) 3000 units TABS Take 3,000 Units by mouth 3 (three) times a week.     . clopidogrel (PLAVIX) 75 MG tablet Take 1 tablet (75 mg total) by mouth daily. 90 tablet 3    . cyanocobalamin 2000 MCG tablet Take 2,500 mcg by mouth daily.    . cyclobenzaprine (FLEXERIL) 5 MG tablet Take every 8 hours as needed for muscle spasms 30 tablet 11  . ethyl chloride spray   3  . gabapentin (NEURONTIN) 300 MG capsule TAKE 1 CAPSULE BY MOUTH EVERY MORNING AND 3 CAPSULES EACH NIGHT AT BEDTIME 120 capsule 11  . LORazepam (ATIVAN) 1 MG tablet Take 1 tablet (1 mg total) by mouth daily as needed for anxiety (nausea). 30 tablet 0  . oxyCODONE (OXY IR/ROXICODONE) 5 MG immediate release tablet Take 1-2 tablets (5-10 mg total) by mouth every 6 (six) hours as needed for severe pain. 60 tablet 0  . oxyCODONE (OXYCONTIN) 10 mg 12 hr tablet Take 10 mg by mouth as needed (for pain).    . VELPHORO 500 MG chewable tablet Chew 500-1,500 mg by mouth See admin instructions. Take '500mg'$  to '1500mg'$  depending on the meal size  10  . carvedilol (COREG) 3.125 MG tablet Take 1 tablet by mouth twice a day on NON DIALYSIS DAYS ONLY (Patient not taking: Reported on 02/12/2018) 180 tablet 3   No current facility-administered medications for this visit.     Allergies  Allergen Reactions  . No Known Allergies     Social History   Socioeconomic History  . Marital status: Divorced    Spouse name: Not on file  . Number of children: Not on file  . Years of education: Not on file  . Highest education level: Not on file  Occupational History  . Not on file  Social Needs  . Financial resource strain: Not on file  . Food insecurity:    Worry: Not on file    Inability: Not on file  . Transportation needs:    Medical: Not on file    Non-medical: Not on file  Tobacco Use  . Smoking status: Former Smoker    Packs/day: 2.00    Years: 40.00    Pack years: 80.00    Types: Cigarettes    Last attempt to quit: 03/19/2011    Years since quitting: 6.9  . Smokeless tobacco: Never Used  Substance and Sexual Activity  . Alcohol use: No    Alcohol/week: 0.0 standard drinks  . Drug use: No    Comment:  09/08/2014 "I have used marijuana till the 1990's". Notes that he quit 15 yrs ago  . Sexual activity: Not Currently  Lifestyle  . Physical activity:    Days per week: Not on file    Minutes per session: Not on file  . Stress: Not on file  Relationships  . Social connections:    Talks on phone: Not on file    Gets together: Not on file    Attends religious service: Not on file    Active member of club or organization: Not on file    Attends meetings of clubs or organizations: Not on file    Relationship status: Not on  file  . Intimate partner violence:    Fear of current or ex partner: Not on file    Emotionally abused: Not on file    Physically abused: Not on file    Forced sexual activity: Not on file  Other Topics Concern  . 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     Family History  Problem Relation Age of Onset  . Hypertension Mother   . Diabetes Mother   . Heart disease Mother   . Skin cancer Mother   . Hypertension Father   . Diabetes Father   . Diabetes Sister     Review of Systems:  As stated in the HPI and otherwise negative.   BP (!) 144/62   Pulse 76   Ht '5\' 10"'$  (1.778 m)   Wt 181 lb (82.1 kg)   SpO2 95%   BMI 25.97 kg/m   Physical Examination: General: Well developed, well nourished, NAD  HEENT: OP clear, mucus membranes moist  SKIN: warm, dry. No rashes. Neuro: No focal deficits  Musculoskeletal: Muscle strength 5/5 all ext  Psychiatric: Mood and affect normal  Neck: No JVD, no carotid bruits, no thyromegaly, no lymphadenopathy.  Lungs:Clear bilaterally, no wheezes, rhonci, crackles Cardiovascular: Regular rate and rhythm. Loud, harsh systolic murmur.  Abdomen:Soft. Bowel sounds present. Non-tender.  Extremities: No lower extremity edema. Pulses are 2 + in the bilateral DP/PT.  Echo 12/19/17: Left  ventricle: The cavity size was normal. There was moderate   concentric hypertrophy. Consider amyloid infiltration. Consider   PYP scan. Systolic function was vigorous. The estimated ejection   fraction was in the range of 65% to 70%. Wall motion was normal;   there were no regional wall motion abnormalities. Doppler   parameters are consistent with abnormal left ventricular   relaxation (grade 1 diastolic dysfunction). - Aortic valve: Functionally bicuspid; severely thickened, severely   calcified leaflets; fusion of the left-noncoronary commissure.   Valve mobility was restricted. There was moderate stenosis. There   was mild regurgitation. Peak velocity (S): 385 cm/s. Mean   gradient (S): 31 mm Hg. Valve area (VTI): 1.27 cm^2. Valve area   (Vmax): 1.07 cm^2. Valve area (Vmean): 1.08 cm^2. - Mitral valve: Calcified annulus. - Right ventricle: The cavity size was normal. Wall thickness was   mildly increased. - Pericardium, extracardiac: A trivial pericardial effusion was   identified.  Impressions:  - When compared to prior, EF has improved.  EKG:  EKG is not ordered today. The ekg ordered today demonstrates   Recent Labs: 03/07/2017: TSH 1.37 11/25/2017: ALT 20; B Natriuretic Peptide 4,098.0 11/26/2017: Magnesium 2.1 11/29/2017: BUN 58; Creatinine, Ser 8.83; Potassium 4.0; Sodium 138 11/30/2017: Hemoglobin 9.6; Platelets 124   Lipid Panel    Component Value Date/Time   CHOL 192 02/25/2014 0959   TRIG 210 (H) 11/28/2017 1352   HDL 26 (L) 02/25/2014 0959   CHOLHDL 7.4 02/25/2014 0959   VLDL 40 02/25/2014 0959   LDLCALC 126 (H) 02/25/2014 0959     Wt Readings from Last 3 Encounters:  02/12/18 181 lb (82.1 kg)  01/07/18 182 lb (82.6 kg)  12/26/17 181 lb 6.4 oz (82.3 kg)     Other studies Reviewed: Additional studies/ records that were reviewed today include:  Review of the above records demonstrates:    Assessment and Plan:  1. CAD without angina: Recent MI. RCA  occlusion felt to be culprit but no PCI given good collaterals to the RCA. Continue ASA, Plavix and statin. No beta blocker with hypotension on dialysis days.   2. Chronic combined CHF: LVEF improved to 65% on most recent echo. Grade 1 diastolic dysfunction. Unable to tolerate beta blocker, Ace-inh or ARB due to hypotension on dialysis. Volume status is ok today.   3. Aortic stenosis: Moderate AS by echo October 2019. Will follow and repeating echo in October 2020.   4. HTN: BP controlled. Followed in Nephrology  5. Carotid artery disease: Mild bilateral carotid artery disease by dopplers October 2019.   6. ESRD: on HD.   7. Hyperlipidemia: Will check lipids and LFTs today. Continue statin.   Current medicines are reviewed at length with the patient today.  The patient does not have concerns regarding medicines.  The following changes have been made:  no change  Labs/ tests ordered today include:   Orders Placed This Encounter  Procedures  . Lipid Profile  . Hepatic function panel     Disposition:   FU with me in 6 months   Signed, Lauree Chandler, MD 02/12/2018 8:53 AM    [facility] [address], [place], [place]  [address] Phone: [phone]; Fax: [phone]

## 2018-02-11 NOTE — Therapy (Signed)
[place], [place], [address] Phone: [phone]   Fax:  [phone]  Physical Therapy Treatment  Patient Details  Name: Jose Garrett MRN: [id number] Date of Birth: 1950/08/21 Referring Provider (PT): Cameron Sprang, MD   Encounter Date: 02/11/2018  PT End of Session - 02/11/18 1259    Visit Number  4    Number of Visits  9    Date for PT Re-Evaluation  02/21/18    Authorization Type  Medicare   PN at visit 10, KX at visit 15    PT Start Time  1148    PT Stop Time  1245    PT Time Calculation (min)  57 min    Activity Tolerance  Patient tolerated treatment well    Behavior During Therapy  [facility] for tasks assessed/performed       Past Medical History:  Diagnosis Date  . Amyloidosis ([place])   . Anemia Dx 2016  . CAD (coronary artery disease)    a. cardiac cath on 11/25/17 with occlusion of the RCA which could have been his event but the RCA could not be engaged. The RCA filled distally from left to right collaterals. There were no severe stenoses in the left coronary system. Medical therapy recommended, started on plavix.  . Chronic combined systolic and diastolic CHF (congestive heart failure) ([place])    a. EF dropped to 35-40% with grade 2 DD by echo 11/2017.  [name] Diarrhea   . ESRD (end stage renal disease) ([place])   . Family history of adverse reaction to anesthesia    "my daughter can't take certain anesthesia agents" (09/08/2014)  . GERD (gastroesophageal reflux disease)   . History of blood transfusion 09/08/2014   "got hematoma after renal biopsy & HgB dropped"  . Hyperlipidemia Dx 2012  . Hypertension Dx 2012  . Restless legs     Past Surgical History:  Procedure Laterality Date  . AV FISTULA PLACEMENT Right 12/27/2015   Procedure: Right Arm ARTERIOVENOUS (AV) FISTULA CREATION;  Surgeon: Angelia Mould, MD;  Location: [facility];  Service: Vascular;  Laterality: Right;  . COLONOSCOPY    . IR  GENERIC HISTORICAL  01/27/2016   IR FLUORO GUIDE CV LINE RIGHT 01/27/2016 Arne Cleveland, MD [facility]  . IR GENERIC HISTORICAL  01/27/2016   IR US GUIDE VASC ACCESS RIGHT 01/27/2016 Arne Cleveland, MD [facility]  . LAPAROSCOPIC CHOLECYSTECTOMY  03/2011  . LEFT HEART CATH AND CORONARY ANGIOGRAPHY N/A 11/25/2017   Procedure: LEFT HEART CATH AND CORONARY ANGIOGRAPHY;  Surgeon: Martinique, Peter M, MD;  Location: [facility];  Service: Cardiovascular;  Laterality: N/A;  . RENAL BIOPSY, PERCUTANEOUS Right 09/08/2014  . TONSILLECTOMY  ~ 1960    There were no vitals filed for this visit.  Subjective Assessment - 02/11/18 1153    Subjective  I do the exercises when I can.  When discussing attendance at 2 different PT places with patient, he told me he checked with insurance and they are covering both clinics.  He has just a few visits left at the other clinic.  They do not treat back pain at the other clinic.  Pain flare this morning for no reason.      Currently in Pain?  Yes    Pain Score  6     Pain Location  Back    Pain Orientation  Lower    Pain Descriptors / Indicators  Spasm    Pain Type  Chronic pain    Pain Frequency  Intermittent    Aggravating Factors   started this morning  not sure why    Pain Relieving Factors   I just wait    Multiple Pain Sites  --   neuropathy feet,  long standing                      [facility] Adult PT Treatment/Exercise - 02/11/18 0001      Lumbar Exercises: Stretches   Passive Hamstring Stretch  3 reps;30 seconds   difficult   Single Knee to Chest Stretch  3 reps;30 seconds    Lower Trunk Rotation  3 reps;30 seconds    Gastroc Stretch  10 seconds;3 reps    Gastroc Stretch Limitations  manually in supine,  gentle due to neuropathy      Lumbar Exercises: Supine   Ab Set  10 reps    Pelvic Tilt  10 reps    Bridge  5 reps    Other Supine Lumbar Exercises  decompression with shoulder press. head press  ,  leg lengthener,  leg  press  HEp       Modalities   Modalities  Moist Heat      Moist Heat Therapy   Number Minutes Moist Heat  15 Minutes    Moist Heat Location  Lumbar Spine      Manual Therapy   Manual Therapy  Passive ROM   passive stretching to hips both   Manual therapy comments  Contract relax X 2 each hamstrings,   PROM hip IR/ER each with sustained Calf stretch              PT Education - 02/11/18 1258    Education Details  exercise form    Person(s) Educated  Patient    Methods  Explanation;Demonstration;Tactile cues;Verbal cues    Comprehension  Returned demonstration;Verbalized understanding          PT Long Term Goals - 02/11/18 1304      PT LONG TERM GOAL #1   Title  Gross LE strength 5/5    Time  4    Period  Weeks    Status  Unable to assess      PT LONG TERM GOAL #2   Title  Pt will be able to sit comfortably with use of posture and pillows    Baseline  sits more comfortably in computer chair with support.    Time  4    Period  Weeks    Status  Partially Met      PT LONG TERM GOAL #3   Baseline  Some stretches are helpful    Time  4    Period  Weeks    Status  Partially Met      PT LONG TERM GOAL #4   Title  Pt will verbalize understanding of pain coming from neuropathy/lumbar     Baseline  understands.  he is getting neuropathy treatment  at another clinic.     Time  4    Period  Weeks    Status  Achieved      PT LONG TERM GOAL #5   Title  FOTO to 43% limitation    Time  4    Period  Weeks    Status  Unable to assess            Plan - 02/11/18 1307    Clinical Impression Statement  LTG#3 partially met,  LTG #4 met.  Pain flare today not sure why.  Able to progress HEP.  Patient noted hamstrings were able to relax more after session.       PT Next Visit Plan  gross stretching, core engagement.  eview decompression,  manual as needed.  KX modifier due to Visits at 2 clinics.     PT Home Exercise Plan  LTR, seated HSS, seated LAQ with ball  squeeze,     Consulted and Agree with Plan of Care  Patient       Patient will benefit from skilled therapeutic intervention in order to improve the following deficits and impairments:     Visit Diagnosis: Chronic bilateral low back pain without sciatica  Muscle weakness (generalized)  Difficulty in walking, not elsewhere classified     Problem List Patient Active Problem List   Diagnosis Date Noted  . Acute on chronic combined systolic and diastolic CHF (congestive heart failure) ([place]) 11/30/2017  . NSTEMI (non-ST elevated myocardial infarction) ([place])   . Elevated troponin 11/25/2017  . Respiratory failure ([place])   . Neuropathy 03/14/2017  . Fever   . CKD (chronic kidney disease) stage 5, GFR less than 15 ml/min (H[facility])   . ESRD (end stage renal disease) ([place])   . Acute renal failure superimposed on chronic kidney disease ([place])   . Hypervolemia   . Acute diastolic (congestive) heart failure ([place]) 01/23/2016  . Fluid overload, unspecified 01/23/2016  . Acute pulmonary edema ([place]) 01/23/2016  . Skin tear of right forearm without complication 73/53/2992  . Iron deficiency anemia 11/22/2015  . Mild aortic stenosis 08/22/2015  . Chronic kidney disease with dialysis modality undecided, stage 5 ([place]): Progressive 08/01/2015  . B12 deficiency 07/19/2015  . Seasonal allergies 06/30/2015  . Back pain 03/18/2015  . Hip pain 02/18/2015  . AL amyloidosis ([place]) 09/25/2014  . Anemia of chronic disease 09/25/2014  . Abnormal bruising 09/25/2014  . Renal hematoma, right 09/08/2014  . Chronic diastolic heart failure, NYHA class 1 ([place]) 09/08/2014  . Hematuria   . Proteinuria   . Vitamin D deficiency 01/21/2014  . Hypertriglyceridemia 01/21/2014  . Essential hypertension, benign 11/06/2013  . Dependent edema 11/06/2013  . DOE (dyspnea on exertion) 11/06/2013  . Other malaise and fatigue 11/06/2013    Thailyn Khalid  PTA 02/11/2018, 1:11 PM  [facility] [address] [place], [place], [address] Phone: [phone]   Fax:  [phone]  Name: ERLING ARRAZOLA MRN: [id number] Date of Birth: Dec 28, 1950

## 2018-02-12 ENCOUNTER — Ambulatory Visit (INDEPENDENT_AMBULATORY_CARE_PROVIDER_SITE_OTHER): Payer: Medicare Other | Admitting: Cardiovascular Disease

## 2018-02-12 ENCOUNTER — Encounter: Payer: Self-pay | Admitting: Cardiovascular Disease

## 2018-02-12 VITALS — BP 144/62 | HR 76 | Ht 70.0 in | Wt 181.0 lb

## 2018-02-12 DIAGNOSIS — I5042 Chronic combined systolic (congestive) and diastolic (congestive) heart failure: Secondary | ICD-10-CM

## 2018-02-12 DIAGNOSIS — E7800 Pure hypercholesterolemia, unspecified: Secondary | ICD-10-CM

## 2018-02-12 DIAGNOSIS — I2510 Atherosclerotic heart disease of native coronary artery without angina pectoris: Secondary | ICD-10-CM

## 2018-02-12 DIAGNOSIS — I6523 Occlusion and stenosis of bilateral carotid arteries: Secondary | ICD-10-CM

## 2018-02-12 DIAGNOSIS — I10 Essential (primary) hypertension: Secondary | ICD-10-CM

## 2018-02-12 DIAGNOSIS — D509 Iron deficiency anemia, unspecified: Secondary | ICD-10-CM | POA: Diagnosis not present

## 2018-02-12 DIAGNOSIS — I350 Nonrheumatic aortic (valve) stenosis: Secondary | ICD-10-CM | POA: Diagnosis not present

## 2018-02-12 DIAGNOSIS — N2581 Secondary hyperparathyroidism of renal origin: Secondary | ICD-10-CM | POA: Diagnosis not present

## 2018-02-12 DIAGNOSIS — N186 End stage renal disease: Secondary | ICD-10-CM | POA: Diagnosis not present

## 2018-02-12 LAB — HEPATIC FUNCTION PANEL
ALT: 15 IU/L (ref 0–44)
AST: 21 IU/L (ref 0–40)
Albumin: 5 g/dL — ABNORMAL HIGH (ref 3.6–4.8)
Alkaline Phosphatase: 66 IU/L (ref 39–117)
Bilirubin Total: 0.5 mg/dL (ref 0.0–1.2)
Bilirubin, Direct: 0.18 mg/dL (ref 0.00–0.40)
TOTAL PROTEIN: 7.4 g/dL (ref 6.0–8.5)

## 2018-02-12 LAB — LIPID PANEL
CHOL/HDL RATIO: 3.7 ratio (ref 0.0–5.0)
Cholesterol, Total: 117 mg/dL (ref 100–199)
HDL: 32 mg/dL — AB (ref >39)
LDL CALC: 50 mg/dL (ref 0–99)
Triglycerides: 174 mg/dL — ABNORMAL HIGH (ref 0–149)
VLDL CHOLESTEROL CAL: 35 mg/dL (ref 5–40)

## 2018-02-12 NOTE — Patient Instructions (Addendum)
Medication Instructions:  Your physician recommends that you continue on your current medications as directed. Please refer to the Current Medication list given to you today.  If you need a refill on your cardiac medications before your next appointment, please call your pharmacy.   Lab work: Lab work to be done today--Lipid and liver profiles If you have labs (blood work) drawn today and your tests are completely normal, you will receive your results only by: [name] MyChart Message (if you have MyChart) OR . A paper copy in the mail If you have any lab test that is abnormal or we need to change your treatment, we will call you to review the results.  Testing/Procedures: none  Follow-Up: At [facility], you and your health needs are our priority.  As part of our continuing mission to provide you with exceptional heart care, we have created designated Provider Care Teams.  These Care Teams include your primary Cardiologist (physician) and Advanced Practice Providers (APPs -  Physician Assistants and Nurse Practitioners) who all work together to provide you with the care you need, when you need it. You will need a follow up appointment in 6 months.  Please call our office 2 months in advance to schedule this appointment.  You may see Lauree Chandler, MD or one of the following Advanced Practice Providers on your designated Care Team:   [place], PA-C Melina Copa, PA-C . Ermalinda Barrios, PA-C  Any Other Special Instructions Will Be Listed Below (If Applicable).

## 2018-02-13 ENCOUNTER — Ambulatory Visit: Payer: Medicare Other | Admitting: Physical Therapy

## 2018-02-14 DIAGNOSIS — N186 End stage renal disease: Secondary | ICD-10-CM | POA: Diagnosis not present

## 2018-02-14 DIAGNOSIS — N2581 Secondary hyperparathyroidism of renal origin: Secondary | ICD-10-CM | POA: Diagnosis not present

## 2018-02-14 DIAGNOSIS — D509 Iron deficiency anemia, unspecified: Secondary | ICD-10-CM | POA: Diagnosis not present

## 2018-02-17 DIAGNOSIS — N186 End stage renal disease: Secondary | ICD-10-CM | POA: Diagnosis not present

## 2018-02-17 DIAGNOSIS — N2581 Secondary hyperparathyroidism of renal origin: Secondary | ICD-10-CM | POA: Diagnosis not present

## 2018-02-17 DIAGNOSIS — D509 Iron deficiency anemia, unspecified: Secondary | ICD-10-CM | POA: Diagnosis not present

## 2018-02-18 ENCOUNTER — Ambulatory Visit: Payer: Medicare Other | Admitting: Physical Therapy

## 2018-02-18 DIAGNOSIS — R262 Difficulty in walking, not elsewhere classified: Secondary | ICD-10-CM | POA: Diagnosis not present

## 2018-02-18 DIAGNOSIS — M545 Low back pain: Secondary | ICD-10-CM | POA: Diagnosis not present

## 2018-02-18 DIAGNOSIS — G8929 Other chronic pain: Secondary | ICD-10-CM | POA: Diagnosis not present

## 2018-02-18 DIAGNOSIS — M6281 Muscle weakness (generalized): Secondary | ICD-10-CM | POA: Diagnosis not present

## 2018-02-18 NOTE — Therapy (Signed)
[address] [place], [place], [address] Phone: [phone]   Fax:  [phone]  Physical Therapy Treatment  Patient Details  Name: Jose Garrett MRN: [id number] Date of Birth: 1950-10-12 Referring Provider (PT): Cameron Sprang, MD   Encounter Date: 02/18/2018  PT End of Session - 02/18/18 1256    Visit Number  5    Number of Visits  9    Date for PT Re-Evaluation  02/21/18    Authorization Type  Medicare   PN at visit 10, KX at visit 15    PT Start Time  1200    PT Stop Time  1243    PT Time Calculation (min)  43 min    Activity Tolerance  Patient tolerated treatment well    Behavior During Therapy  [facility] for tasks assessed/performed       Past Medical History:  Diagnosis Date  . Amyloidosis ([place])   . Anemia Dx 2016  . CAD (coronary artery disease)    a. cardiac cath on 11/25/17 with occlusion of the RCA which could have been his event but the RCA could not be engaged. The RCA filled distally from left to right collaterals. There were no severe stenoses in the left coronary system. Medical therapy recommended, started on plavix.  . Chronic combined systolic and diastolic CHF (congestive heart failure) ([place])    a. EF dropped to 35-40% with grade 2 DD by echo 11/2017.  [name] Diarrhea   . ESRD (end stage renal disease) ([place])   . Family history of adverse reaction to anesthesia    "my daughter can't take certain anesthesia agents" (09/08/2014)  . GERD (gastroesophageal reflux disease)   . History of blood transfusion 09/08/2014   "got hematoma after renal biopsy & HgB dropped"  . Hyperlipidemia Dx 2012  . Hypertension Dx 2012  . Restless legs     Past Surgical History:  Procedure Laterality Date  . AV FISTULA PLACEMENT Right 12/27/2015   Procedure: Right Arm ARTERIOVENOUS (AV) FISTULA CREATION;  Surgeon: Angelia Mould, MD;  Location: [place];  Service: Vascular;  Laterality: Right;  . COLONOSCOPY    . IR  GENERIC HISTORICAL  01/27/2016   IR FLUORO GUIDE CV LINE RIGHT 01/27/2016 Arne Cleveland, MD [facility]  . IR GENERIC HISTORICAL  01/27/2016   IR US GUIDE VASC ACCESS RIGHT 01/27/2016 Arne Cleveland, MD [facility]  . LAPAROSCOPIC CHOLECYSTECTOMY  03/2011  . LEFT HEART CATH AND CORONARY ANGIOGRAPHY N/A 11/25/2017   Procedure: LEFT HEART CATH AND CORONARY ANGIOGRAPHY;  Surgeon: Martinique, Peter M, MD;  Location: [place];  Service: Cardiovascular;  Laterality: N/A;  . RENAL BIOPSY, PERCUTANEOUS Right 09/08/2014  . TONSILLECTOMY  ~ 1960    There were no vitals filed for this visit.  Subjective Assessment - 02/18/18 1203    Subjective  Back has been okay for the most part. My balance bites today.                        [facility] Adult PT Treatment/Exercise - 02/18/18 0001      Exercises   Exercises  Lumbar      Lumbar Exercises: Stretches   Passive Hamstring Stretch Limitations  2x30s each seated EOB    Single Knee to Chest Stretch  Right;Left;2 reps;10 seconds    Gastroc Stretch Limitations  in supine by PT    Other Lumbar Stretch Exercise  standing gastroc stretch  Lumbar Exercises: Aerobic   Nustep  5 min L3 LE only      Lumbar Exercises: Standing   Heel Raises  20 reps    Other Standing Lumbar Exercises  standing hip abduction    Other Standing Lumbar Exercises  standing march      Lumbar Exercises: Supine   Clam  20 reps    Clam Limitations  red tband    Straight Leg Raise  15 reps   both   Straight Leg Raises Limitations  1lb    Other Supine Lumbar Exercises  decompression leg lengthener                  PT Long Term Goals - 02/11/18 1304      PT LONG TERM GOAL #1   Title  Gross LE strength 5/5    Time  4    Period  Weeks    Status  Unable to assess      PT LONG TERM GOAL #2   Title  Pt will be able to sit comfortably with use of posture and pillows    Baseline  sits more comfortably in computer chair with support.     Time  4    Period  Weeks    Status  Partially Met      PT LONG TERM GOAL #3   Baseline  Some stretches are helpful    Time  4    Period  Weeks    Status  Partially Met      PT LONG TERM GOAL #4   Title  Pt will verbalize understanding of pain coming from neuropathy/lumbar     Baseline  understands.  he is getting neuropathy treatment  at another clinic.     Time  4    Period  Weeks    Status  Achieved      PT LONG TERM GOAL #5   Title  FOTO to 43% limitation    Time  4    Period  Weeks    Status  Unable to assess            Plan - 02/18/18 1257    Clinical Impression Statement  Pt felt limited in balance today but only stumbled once. good tolerance to exercise with minimal cues required.     PT Treatment/Interventions  ADLs/Self Care Home Management;Cryotherapy;Electrical Stimulation;Gait training;Moist Heat;Traction;Functional mobility training;Therapeutic activities;Therapeutic exercise;Balance training;Manual techniques;Patient/family education;Passive range of motion;Dry needling;Taping    PT Next Visit Plan  KX modifiers, provide HEP printout (scanned in)- added SLR, ERO    PT Home Exercise Plan  LTR, seated HSS, seated LAQ with ball squeeze, SLR    Consulted and Agree with Plan of Care  Patient       Patient will benefit from skilled therapeutic intervention in order to improve the following deficits and impairments:  Difficulty walking, Increased muscle spasms, Decreased endurance, Decreased activity tolerance, Pain, Improper body mechanics, Impaired flexibility, Decreased balance, Decreased strength, Postural dysfunction  Visit Diagnosis: Chronic bilateral low back pain without sciatica  Muscle weakness (generalized)  Difficulty in walking, not elsewhere classified     Problem List Patient Active Problem List   Diagnosis Date Noted  . Acute on chronic combined systolic and diastolic CHF (congestive heart failure) ([place]) 11/30/2017  . NSTEMI (non-ST  elevated myocardial infarction) ([place])   . Elevated troponin 11/25/2017  . Respiratory failure ([place])   . Neuropathy 03/14/2017  . Fever   . CKD (chronic kidney disease)  stage 5, GFR less than 15 ml/min (HCC)   . ESRD (end stage renal disease) ([place])   . Acute renal failure superimposed on chronic kidney disease ([place])   . Hypervolemia   . Acute diastolic (congestive) heart failure ([place]) 01/23/2016  . Fluid overload, unspecified 01/23/2016  . Acute pulmonary edema ([place]) 01/23/2016  . Skin tear of right forearm without complication 32/91/9166  . Iron deficiency anemia 11/22/2015  . Mild aortic stenosis 08/22/2015  . Chronic kidney disease with dialysis modality undecided, stage 5 ([place]): Progressive 08/01/2015  . B12 deficiency 07/19/2015  . Seasonal allergies 06/30/2015  . Back pain 03/18/2015  . Hip pain 02/18/2015  . AL amyloidosis ([place]) 09/25/2014  . Anemia of chronic disease 09/25/2014  . Abnormal bruising 09/25/2014  . Renal hematoma, right 09/08/2014  . Chronic diastolic heart failure, NYHA class 1 ([place]) 09/08/2014  . Hematuria   . Proteinuria   . Vitamin D deficiency 01/21/2014  . Hypertriglyceridemia 01/21/2014  . Essential hypertension, benign 11/06/2013  . Dependent edema 11/06/2013  . DOE (dyspnea on exertion) 11/06/2013  . Other malaise and fatigue 11/06/2013    Tallie Hevia C. Jhada Risk PT, DPT 02/18/18 1:02 PM   [place] [facility] [address] [place], [place], [address] Phone: [phone]   Fax:  [phone]  Name: KIMBLE HITCHENS MRN: [id number] Date of Birth: April 11, 1950

## 2018-02-19 DIAGNOSIS — N2581 Secondary hyperparathyroidism of renal origin: Secondary | ICD-10-CM | POA: Diagnosis not present

## 2018-02-19 DIAGNOSIS — D509 Iron deficiency anemia, unspecified: Secondary | ICD-10-CM | POA: Diagnosis not present

## 2018-02-19 DIAGNOSIS — N186 End stage renal disease: Secondary | ICD-10-CM | POA: Diagnosis not present

## 2018-02-20 ENCOUNTER — Telehealth: Payer: Self-pay | Admitting: Physical Therapy

## 2018-02-20 ENCOUNTER — Ambulatory Visit: Payer: Medicare Other | Admitting: Physical Therapy

## 2018-02-20 NOTE — Telephone Encounter (Signed)
Left message regarding No show today and advised that next appointment will need to be RS to be with PT due to being outside of POC. Requested call back  Amadeus Oyama C. Javana Schey PT, DPT 02/20/18 11:57 AM

## 2018-02-21 DIAGNOSIS — N2581 Secondary hyperparathyroidism of renal origin: Secondary | ICD-10-CM | POA: Diagnosis not present

## 2018-02-21 DIAGNOSIS — N186 End stage renal disease: Secondary | ICD-10-CM | POA: Diagnosis not present

## 2018-02-21 DIAGNOSIS — D509 Iron deficiency anemia, unspecified: Secondary | ICD-10-CM | POA: Diagnosis not present

## 2018-02-24 DIAGNOSIS — N2581 Secondary hyperparathyroidism of renal origin: Secondary | ICD-10-CM | POA: Diagnosis not present

## 2018-02-24 DIAGNOSIS — N186 End stage renal disease: Secondary | ICD-10-CM | POA: Diagnosis not present

## 2018-02-24 DIAGNOSIS — D509 Iron deficiency anemia, unspecified: Secondary | ICD-10-CM | POA: Diagnosis not present

## 2018-02-25 ENCOUNTER — Encounter: Payer: Self-pay | Admitting: Physical Therapy

## 2018-02-25 ENCOUNTER — Ambulatory Visit: Payer: Medicare Other | Admitting: Physical Therapy

## 2018-02-25 DIAGNOSIS — M545 Low back pain, unspecified: Secondary | ICD-10-CM

## 2018-02-25 DIAGNOSIS — M6281 Muscle weakness (generalized): Secondary | ICD-10-CM

## 2018-02-25 DIAGNOSIS — G8929 Other chronic pain: Secondary | ICD-10-CM | POA: Diagnosis not present

## 2018-02-25 DIAGNOSIS — R262 Difficulty in walking, not elsewhere classified: Secondary | ICD-10-CM

## 2018-02-25 NOTE — Therapy (Addendum)
[place], [place], [address] Phone: [phone]   Fax:  [phone]  Physical Therapy Treatment/ERO/Discharge  Patient Details  Name: MATEO OVERBECK MRN: [id number] Date of Birth: 1950-10-09 Referring Provider (PT): Cameron Sprang, MD   Encounter Date: 02/25/2018  PT End of Session - 02/25/18 1205    Visit Number  6    Number of Visits  13    Date for PT Re-Evaluation  04/11/18    Authorization Type  Medicare   PN at visit 10, KX at visit 15    PT Start Time  1156   pt arrived late   PT Stop Time  1231    PT Time Calculation (min)  35 min    Activity Tolerance  Patient tolerated treatment well    Behavior During Therapy  [facility] for tasks assessed/performed       Past Medical History:  Diagnosis Date  . Amyloidosis ([place])   . Anemia Dx 2016  . CAD (coronary artery disease)    a. cardiac cath on 11/25/17 with occlusion of the RCA which could have been his event but the RCA could not be engaged. The RCA filled distally from left to right collaterals. There were no severe stenoses in the left coronary system. Medical therapy recommended, started on plavix.  . Chronic combined systolic and diastolic CHF (congestive heart failure) ([place])    a. EF dropped to 35-40% with grade 2 DD by echo 11/2017.  [name] Diarrhea   . ESRD (end stage renal disease) ([place])   . Family history of adverse reaction to anesthesia    "my daughter can't take certain anesthesia agents" (09/08/2014)  . GERD (gastroesophageal reflux disease)   . History of blood transfusion 09/08/2014   "got hematoma after renal biopsy & HgB dropped"  . Hyperlipidemia Dx 2012  . Hypertension Dx 2012  . Restless legs     Past Surgical History:  Procedure Laterality Date  . AV FISTULA PLACEMENT Right 12/27/2015   Procedure: Right Arm ARTERIOVENOUS (AV) FISTULA CREATION;  Surgeon: Angelia Mould, MD;  Location: [facility];  Service: Vascular;  Laterality:  Right;  . COLONOSCOPY    . IR GENERIC HISTORICAL  01/27/2016   IR FLUORO GUIDE CV LINE RIGHT 01/27/2016 Arne Cleveland, MD [facility]  . IR GENERIC HISTORICAL  01/27/2016   IR US GUIDE VASC ACCESS RIGHT 01/27/2016 Arne Cleveland, MD [facility]  . LAPAROSCOPIC CHOLECYSTECTOMY  03/2011  . LEFT HEART CATH AND CORONARY ANGIOGRAPHY N/A 11/25/2017   Procedure: LEFT HEART CATH AND CORONARY ANGIOGRAPHY;  Surgeon: Martinique, Peter M, MD;  Location: [place] [facility];  Service: Cardiovascular;  Laterality: N/A;  . RENAL BIOPSY, PERCUTANEOUS Right 09/08/2014  . TONSILLECTOMY  ~ 1960    There were no vitals filed for this visit.  Subjective Assessment - 02/25/18 1200    Subjective  PT has been helpful. I still get the leg spasms and I can't tell if the feet is neuropathy or the back. Would like to do 2 visits next week and then move to 1/week.     Patient Stated Goals  understand difference neuropathy vs back    Currently in Pain?  Yes    Pain Score  3     Pain Location  Back    Pain Orientation  Lower;Right    Pain Descriptors / Indicators  Aching    Aggravating Factors   unknown    Pain Relieving Factors  they will  come down eventually         [facility] PT Assessment - 02/25/18 0001      Assessment   Medical Diagnosis  lumbar radiculopathy    Referring Provider (PT)  Cameron Sprang, MD    Next MD Visit  --   middle of Jan     Precautions   Precaution Comments  on dialysis      Observation/Other Assessments   Focus on Therapeutic Outcomes (FOTO)   47% limitation      Sensation   Additional Comments  decreased sensation bilateral feet      Strength   Right/Left Hip  Right;Left    Right Hip Flexion  5/5    Right Hip Extension  4+/5    Right Hip ABduction  4-/5    Left Hip Flexion  5/5    Left Hip Extension  3/5    Left Hip ABduction  4+/5      Flexibility   Soft Tissue Assessment /Muscle Length  --   bil HS to approx 40 deg                   [facility] Adult PT  Treatment/Exercise - 02/25/18 0001      Lumbar Exercises: Stretches   Passive Hamstring Stretch  Right;Left;30 seconds    Passive Hamstring Stretch Limitations  seated EOB      Lumbar Exercises: Aerobic   Nustep  5 min Ue & LE      Lumbar Exercises: Supine   Bridge with [employer]  10 reps             PT Education - 02/25/18 1234    Education Details  goals, FOTO, HEP, dialysis and physical effects. POC, exercise options    Person(s) Educated  Patient    Methods  Explanation    Comprehension  Verbalized understanding;Need further instruction          PT Long Term Goals - 02/25/18 1207      PT LONG TERM GOAL #1   Title  Gross LE strength 5/5    Baseline  see flowsheet    Status  On-going      PT LONG TERM GOAL #2   Title  Pt will be able to sit comfortably with use of posture and pillows    Baseline  able    Status  Achieved      PT LONG TERM GOAL #3   Title  Pt will be able to utilize stretches and posture to decrease spasm and back pain    Baseline  movement is helpful, overall exercises are helpful    Status  Achieved      PT LONG TERM GOAL #4   Title  Pt will verbalize understanding of pain coming from neuropathy/lumbar     Baseline  understands.  he is getting neuropathy treatment  at another clinic.     Status  Achieved      PT LONG TERM GOAL #5   Title  FOTO to 43% limitation    Baseline  47% limited    Status  On-going      Additional Long Term Goals   Additional Long Term Goals  Yes      PT LONG TERM GOAL #6   Title  will create and be comfortable with long term exercise program    Baseline  will benefit from further progression to create appropriate program to compliment dialysis treatments    Time  6  Period  Weeks    Status  New    Target Date  04/11/18            Plan - 02/25/18 1235    Clinical Impression Statement  Pt has made significant improvements in pain levels and use of postures and exercises. Unfortunately he  continues to have cramps and spasms as well as numbness in his feet with dialysis contributing to severity. At this point we will continue to challenge gross strength and progress HEP as is appropraite when considering personal factors.    PT Frequency  1x / week   2 next week followed by 1/week   PT Duration  6 weeks    PT Treatment/Interventions  ADLs/Self Care Home Management;Cryotherapy;Electrical Stimulation;Gait training;Moist Heat;Traction;Functional mobility training;Therapeutic activities;Therapeutic exercise;Balance training;Manual techniques;Patient/family education;Passive range of motion;Dry needling;Taping    PT Next Visit Plan  KX modifiers, endurance options, glut strengthening, standing proprioception    PT Home Exercise Plan  LTR, seated HSS, seated LAQ with ball squeeze, SLR    Consulted and Agree with Plan of Care  Patient       Patient will benefit from skilled therapeutic intervention in order to improve the following deficits and impairments:  Difficulty walking, Increased muscle spasms, Decreased endurance, Decreased activity tolerance, Pain, Improper body mechanics, Impaired flexibility, Decreased balance, Decreased strength, Postural dysfunction  Visit Diagnosis: Chronic bilateral low back pain without sciatica - Plan: PT plan of care cert/re-cert  Muscle weakness (generalized) - Plan: PT plan of care cert/re-cert  Difficulty in walking, not elsewhere classified - Plan: PT plan of care cert/re-cert     Problem List Patient Active Problem List   Diagnosis Date Noted  . Acute on chronic combined systolic and diastolic CHF (congestive heart failure) ([place]) 11/30/2017  . NSTEMI (non-ST elevated myocardial infarction) ([place])   . Elevated troponin 11/25/2017  . Respiratory failure ([place])   . Neuropathy 03/14/2017  . Fever   . CKD (chronic kidney disease) stage 5, GFR less than 15 ml/min (HCC)   . ESRD (end stage renal disease) ([place])   . Acute renal failure  superimposed on chronic kidney disease ([place])   . Hypervolemia   . Acute diastolic (congestive) heart failure ([place]) 01/23/2016  . Fluid overload, unspecified 01/23/2016  . Acute pulmonary edema ([place]) 01/23/2016  . Skin tear of right forearm without complication 51/88/4166  . Iron deficiency anemia 11/22/2015  . Mild aortic stenosis 08/22/2015  . Chronic kidney disease with dialysis modality undecided, stage 5 ([place]): Progressive 08/01/2015  . B12 deficiency 07/19/2015  . Seasonal allergies 06/30/2015  . Back pain 03/18/2015  . Hip pain 02/18/2015  . AL amyloidosis ([place]) 09/25/2014  . Anemia of chronic disease 09/25/2014  . Abnormal bruising 09/25/2014  . Renal hematoma, right 09/08/2014  . Chronic diastolic heart failure, NYHA class 1 ([place]) 09/08/2014  . Hematuria   . Proteinuria   . Vitamin D deficiency 01/21/2014  . Hypertriglyceridemia 01/21/2014  . Essential hypertension, benign 11/06/2013  . Dependent edema 11/06/2013  . DOE (dyspnea on exertion) 11/06/2013  . Other malaise and fatigue 11/06/2013    Melenie Minniear C. Mili Piltz PT, DPT 02/25/18 12:42 PM   [facility] [address] [place], [place], [address] Phone: [phone]   Fax:  [phone]  Name: TORIBIO SEIBER MRN: [id number] Date of Birth: [birth year]  PHYSICAL THERAPY DISCHARGE SUMMARY  Visits from Start of Care: 6  Current functional level related to goals / functional outcomes: See above   Remaining deficits:  See above   Education / Equipment: Anatomy of condition, POC, HEP, exercise form/rationale  Plan: Patient agrees to discharge.  Patient goals were partially met. Patient is being discharged due to a change in medical status.  ?????   Change in dialysis schedule, multiple ED visits for medical issues.    Shadrack Brummitt C. Kierah Goatley PT, DPT 04/23/18 1:03 PM

## 2018-02-26 DIAGNOSIS — D509 Iron deficiency anemia, unspecified: Secondary | ICD-10-CM | POA: Diagnosis not present

## 2018-02-26 DIAGNOSIS — N186 End stage renal disease: Secondary | ICD-10-CM | POA: Diagnosis not present

## 2018-02-26 DIAGNOSIS — N2581 Secondary hyperparathyroidism of renal origin: Secondary | ICD-10-CM | POA: Diagnosis not present

## 2018-02-28 DIAGNOSIS — D509 Iron deficiency anemia, unspecified: Secondary | ICD-10-CM | POA: Diagnosis not present

## 2018-02-28 DIAGNOSIS — N186 End stage renal disease: Secondary | ICD-10-CM | POA: Diagnosis not present

## 2018-02-28 DIAGNOSIS — N2581 Secondary hyperparathyroidism of renal origin: Secondary | ICD-10-CM | POA: Diagnosis not present

## 2018-03-02 DIAGNOSIS — N2581 Secondary hyperparathyroidism of renal origin: Secondary | ICD-10-CM | POA: Diagnosis not present

## 2018-03-02 DIAGNOSIS — N186 End stage renal disease: Secondary | ICD-10-CM | POA: Diagnosis not present

## 2018-03-02 DIAGNOSIS — D509 Iron deficiency anemia, unspecified: Secondary | ICD-10-CM | POA: Diagnosis not present

## 2018-03-04 ENCOUNTER — Ambulatory Visit: Payer: Medicare Other | Admitting: Podiatry

## 2018-03-04 DIAGNOSIS — D509 Iron deficiency anemia, unspecified: Secondary | ICD-10-CM | POA: Diagnosis not present

## 2018-03-04 DIAGNOSIS — N186 End stage renal disease: Secondary | ICD-10-CM | POA: Diagnosis not present

## 2018-03-04 DIAGNOSIS — N2581 Secondary hyperparathyroidism of renal origin: Secondary | ICD-10-CM | POA: Diagnosis not present

## 2018-03-06 ENCOUNTER — Telehealth: Payer: Self-pay | Admitting: Physical Therapy

## 2018-03-06 ENCOUNTER — Ambulatory Visit: Payer: Medicare Other | Admitting: Physical Therapy

## 2018-03-06 NOTE — Telephone Encounter (Signed)
Left message on voicemail about missed visit.  Date and time of next visit given. Our phone number was given for patient to call if he is unable to attend or if he no longer needs PT. Melvenia Needles PTA

## 2018-03-07 DIAGNOSIS — D509 Iron deficiency anemia, unspecified: Secondary | ICD-10-CM | POA: Diagnosis not present

## 2018-03-07 DIAGNOSIS — N2581 Secondary hyperparathyroidism of renal origin: Secondary | ICD-10-CM | POA: Diagnosis not present

## 2018-03-07 DIAGNOSIS — N186 End stage renal disease: Secondary | ICD-10-CM | POA: Diagnosis not present

## 2018-03-09 DIAGNOSIS — N2581 Secondary hyperparathyroidism of renal origin: Secondary | ICD-10-CM | POA: Diagnosis not present

## 2018-03-09 DIAGNOSIS — D509 Iron deficiency anemia, unspecified: Secondary | ICD-10-CM | POA: Diagnosis not present

## 2018-03-09 DIAGNOSIS — N186 End stage renal disease: Secondary | ICD-10-CM | POA: Diagnosis not present

## 2018-03-11 ENCOUNTER — Ambulatory Visit: Payer: Medicare Other | Admitting: Physical Therapy

## 2018-03-11 ENCOUNTER — Telehealth: Payer: Self-pay | Admitting: Physical Therapy

## 2018-03-11 DIAGNOSIS — N186 End stage renal disease: Secondary | ICD-10-CM | POA: Diagnosis not present

## 2018-03-11 DIAGNOSIS — N2581 Secondary hyperparathyroidism of renal origin: Secondary | ICD-10-CM | POA: Diagnosis not present

## 2018-03-11 DIAGNOSIS — D509 Iron deficiency anemia, unspecified: Secondary | ICD-10-CM | POA: Diagnosis not present

## 2018-03-11 NOTE — Telephone Encounter (Signed)
Spoke with patient regarding no show. Had to change dialysis schedule due to holiday. Will call us back to schedule more appointments. Tyeson T[place]oto C. Tyr Franca PT, DPT 03/11/18 11:41 AM

## 2018-03-12 ENCOUNTER — Encounter (HOSPITAL_COMMUNITY): Payer: Self-pay | Admitting: Emergency Medicine

## 2018-03-12 ENCOUNTER — Emergency Department (HOSPITAL_COMMUNITY)
Admission: EM | Admit: 2018-03-12 | Discharge: 2018-03-12 | Disposition: A | Discharge status: Home / Self Care | Payer: Medicare Other | Attending: Emergency Medicine | Admitting: Emergency Medicine

## 2018-03-12 ENCOUNTER — Other Ambulatory Visit: Payer: Self-pay

## 2018-03-12 DIAGNOSIS — Z992 Dependence on renal dialysis: Secondary | ICD-10-CM | POA: Diagnosis not present

## 2018-03-12 DIAGNOSIS — Z79899 Other long term (current) drug therapy: Secondary | ICD-10-CM | POA: Insufficient documentation

## 2018-03-12 DIAGNOSIS — I5042 Chronic combined systolic (congestive) and diastolic (congestive) heart failure: Secondary | ICD-10-CM | POA: Diagnosis not present

## 2018-03-12 DIAGNOSIS — T82838A Hemorrhage of vascular prosthetic devices, implants and grafts, initial encounter: Secondary | ICD-10-CM | POA: Insufficient documentation

## 2018-03-12 DIAGNOSIS — N186 End stage renal disease: Secondary | ICD-10-CM | POA: Insufficient documentation

## 2018-03-12 DIAGNOSIS — Y828 Other medical devices associated with adverse incidents: Secondary | ICD-10-CM | POA: Diagnosis not present

## 2018-03-12 DIAGNOSIS — I129 Hypertensive chronic kidney disease with stage 1 through stage 4 chronic kidney disease, or unspecified chronic kidney disease: Secondary | ICD-10-CM | POA: Diagnosis not present

## 2018-03-12 DIAGNOSIS — I132 Hypertensive heart and chronic kidney disease with heart failure and with stage 5 chronic kidney disease, or end stage renal disease: Secondary | ICD-10-CM | POA: Insufficient documentation

## 2018-03-12 DIAGNOSIS — Z7982 Long term (current) use of aspirin: Secondary | ICD-10-CM | POA: Insufficient documentation

## 2018-03-12 DIAGNOSIS — Z87891 Personal history of nicotine dependence: Secondary | ICD-10-CM | POA: Insufficient documentation

## 2018-03-12 NOTE — ED Notes (Signed)
ED Provider at bedside. 

## 2018-03-12 NOTE — Discharge Instructions (Signed)
Do not take off the bandage until you go to your dialysis center.  Return for bleeding through the dressing.  Return also for chest pain shortness of breath.

## 2018-03-12 NOTE — ED Triage Notes (Signed)
C/o bleeding to R arm dialysis graft since finishing dialysis yesterday.  States it usually stops bleeding in the evening and he removes the bandage but has continued "oozing".

## 2018-03-12 NOTE — ED Notes (Signed)
EKG done d/t c/o dizziness

## 2018-03-12 NOTE — ED Provider Notes (Signed)
[facility] EMERGENCY DEPARTMENT Provider Note   CSN: [id number] Arrival date & time: 03/12/18  1836     History   Chief Complaint Chief Complaint  Patient presents with  . Vascular Access Problem    HPI Jose Garrett is a 68 y.o. male.  68 yo M with a cc of bleeding from his dialysis fistula.    The history is provided by the patient.  Illness  This is a new problem. The current episode started yesterday. The problem occurs constantly. The problem has not changed since onset.Pertinent negatives include no chest pain, no abdominal pain, no headaches and no shortness of breath. Nothing aggravates the symptoms. Nothing relieves the symptoms. He has tried nothing for the symptoms. The treatment provided no relief.    Past Medical History:  Diagnosis Date  . Amyloidosis ([place])   . Anemia Dx 2016  . CAD (coronary artery disease)    a. cardiac cath on 11/25/17 with occlusion of the RCA which could have been his event but the RCA could not be engaged. The RCA filled distally from left to right collaterals. There were no severe stenoses in the left coronary system. Medical therapy recommended, started on plavix.  . Chronic combined systolic and diastolic CHF (congestive heart failure) ([place])    a. EF dropped to 35-40% with grade 2 DD by echo 11/2017.  [name] Diarrhea   . ESRD (end stage renal disease) ([place])   . Family history of adverse reaction to anesthesia    "my daughter can't take certain anesthesia agents" (09/08/2014)  . GERD (gastroesophageal reflux disease)   . History of blood transfusion 09/08/2014   "got hematoma after renal biopsy & HgB dropped"  . Hyperlipidemia Dx 2012  . Hypertension Dx 2012  . Restless legs     Patient Active Problem List   Diagnosis Date Noted  . Acute on chronic combined systolic and diastolic CHF (congestive heart failure) ([place]) 11/30/2017  . NSTEMI (non-ST elevated myocardial infarction) ([place])   . Elevated troponin 11/25/2017  .  Respiratory failure ([place])   . Neuropathy 03/14/2017  . Fever   . CKD (chronic kidney disease) stage 5, GFR less than 15 ml/min (HCC)   . ESRD (end stage renal disease) ([place])   . Acute renal failure superimposed on chronic kidney disease ([place])   . Hypervolemia   . Acute diastolic (congestive) heart failure ([place]) 01/23/2016  . Fluid overload, unspecified 01/23/2016  . Acute pulmonary edema ([place]) 01/23/2016  . Skin tear of right forearm without complication 84/13/2440  . Iron deficiency anemia 11/22/2015  . Mild aortic stenosis 08/22/2015  . Chronic kidney disease with dialysis modality undecided, stage 5 ([place]): Progressive 08/01/2015  . B12 deficiency 07/19/2015  . Seasonal allergies 06/30/2015  . Back pain 03/18/2015  . Hip pain 02/18/2015  . AL amyloidosis ([place]) 09/25/2014  . Anemia of chronic disease 09/25/2014  . Abnormal bruising 09/25/2014  . Renal hematoma, right 09/08/2014  . Chronic diastolic heart failure, NYHA class 1 ([place]) 09/08/2014  . Hematuria   . Proteinuria   . Vitamin D deficiency 01/21/2014  . Hypertriglyceridemia 01/21/2014  . Essential hypertension, benign 11/06/2013  . Dependent edema 11/06/2013  . DOE (dyspnea on exertion) 11/06/2013  . Other malaise and fatigue 11/06/2013    Past Surgical History:  Procedure Laterality Date  . AV FISTULA PLACEMENT Right 12/27/2015   Procedure: Right Arm ARTERIOVENOUS (AV) FISTULA CREATION;  Surgeon: Angelia Mould, MD;  Location: [facility];  Service: Vascular;  Laterality: Right;  . COLONOSCOPY    . IR GENERIC HISTORICAL  01/27/2016   IR FLUORO GUIDE CV LINE RIGHT 01/27/2016 Arne Cleveland, MD [facility]INTERV RAD  . IR GENERIC HISTORICAL  01/27/2016   IR US GUIDE VASC ACCESS RIGHT 01/27/2016 Arne Cleveland, MD [facility]  . LAPAROSCOPIC CHOLECYSTECTOMY  03/2011  . LEFT HEART CATH AND CORONARY ANGIOGRAPHY N/A 11/25/2017   Procedure: LEFT HEART CATH AND CORONARY ANGIOGRAPHY;  Surgeon: Martinique, Peter M, MD;  Location: [facility] CV LAB;  Service: Cardiovascular;  Laterality: N/A;  . RENAL BIOPSY, PERCUTANEOUS Right 09/08/2014  . TONSILLECTOMY  ~ 1960        Home Medications    Prior to Admission medications   Medication Sig Start Date End Date Taking? Authorizing Provider  acetaminophen (TYLENOL) 325 MG tablet Take 325-650 mg by mouth every 6 (six) hours as needed (back pain).    [provider]  aspirin EC 81 MG tablet Take 1 tablet (81 mg total) by mouth daily. 11/30/17   Cheryln Manly, NP  atorvastatin (LIPITOR) 80 MG tablet Take 80 mg by mouth daily.    [provider]  B Complex-C-Folic Acid (DIALYVITE 390) 0.8 MG TABS Take 0.8 mg by mouth daily.    [provider]  carvedilol (COREG) 3.125 MG tablet Take 1 tablet by mouth twice a day on NON DIALYSIS DAYS ONLY Patient not taking: Reported on 02/12/2018 12/26/17   Leanor Kail, PA  Cholecalciferol (VITAMIN D3) 3000 units TABS Take 3,000 Units by mouth 3 (three) times a week.     [provider]  clopidogrel (PLAVIX) 75 MG tablet Take 1 tablet (75 mg total) by mouth daily. 12/20/17   Dunn, Nedra Hai, PA-C  cyanocobalamin 2000 MCG tablet Take 2,500 mcg by mouth daily.    [provider]  cyclobenzaprine (FLEXERIL) 5 MG tablet Take every 8 hours as needed for muscle spasms 01/07/18   Cameron Sprang, MD  ethyl chloride spray  12/20/17   [provider]  gabapentin (NEURONTIN) 300 MG capsule TAKE 1 CAPSULE BY MOUTH EVERY MORNING AND 3 CAPSULES EACH NIGHT AT BEDTIME 01/07/18   Cameron Sprang, MD  LORazepam (ATIVAN) 1 MG tablet Take 1 tablet (1 mg total) by mouth daily as needed for anxiety (nausea). 01/08/17   [name], MD  oxyCODONE (OXY IR/ROXICODONE) 5 MG immediate release tablet Take 1-2 tablets (5-10 mg total) by mouth every 6 (six) hours as needed for severe pain. 10/02/17   [name], MD  oxyCODONE (OXYCONTIN) 10 mg 12 hr tablet Take 10 mg by mouth as needed (for  pain).    [provider]  VELPHORO 500 MG chewable tablet Chew 500-1,500 mg by mouth See admin instructions. Take 500mg  to 1500mg  depending on the meal size 12/28/16   [provider]    Family History Family History  Problem Relation Age of Onset  . Hypertension Mother   . Diabetes Mother   . Heart disease Mother   . Skin cancer Mother   . Hypertension Father   . Diabetes Father   . Diabetes Sister     Social History Social History   Tobacco Use  . Smoking status: Former Smoker    Packs/day: 2.00    Years: 40.00    Pack years: 80.00    Types: Cigarettes    Last attempt to quit: 03/19/2011    Years since quitting: 6.9  . Smokeless tobacco: Never Used  Substance Use Topics  .  Alcohol use: No    Alcohol/week: 0.0 standard drinks  . Drug use: No    Comment: 09/08/2014 "I have used marijuana till the 1990's". Notes that he quit 15 yrs ago     Allergies   No known allergies   Review of Systems Review of Systems  Constitutional: Negative for chills and fever.  HENT: Negative for congestion and facial swelling.   Eyes: Negative for discharge and visual disturbance.  Respiratory: Negative for shortness of breath.   Cardiovascular: Negative for chest pain and palpitations.  Gastrointestinal: Negative for abdominal pain, diarrhea and vomiting.  Musculoskeletal: Negative for arthralgias and myalgias.  Skin: Negative for color change and rash.  Neurological: Negative for tremors, syncope and headaches.  Psychiatric/Behavioral: Negative for confusion and dysphoric mood.     Physical Exam Updated Vital Signs BP (!) 165/69 (BP Location: Left Arm)   Pulse 90   Temp 98.5 F (36.9 C) (Oral)   Resp 12   SpO2 98%   Physical Exam Vitals signs and nursing note reviewed.  Constitutional:      Appearance: He is well-developed.  HENT:     Head: Normocephalic and atraumatic.  Eyes:     Pupils: Pupils are equal, round, and reactive to light.  Neck:      Musculoskeletal: Normal range of motion and neck supple.     Vascular: No JVD.  Cardiovascular:     Rate and Rhythm: Normal rate and regular rhythm.     Heart sounds: No murmur. No friction rub. No gallop.   Pulmonary:     Effort: No respiratory distress.     Breath sounds: No wheezing.  Abdominal:     General: There is no distension.     Tenderness: There is no guarding or rebound.  Musculoskeletal: Normal range of motion.     Comments: r av fistula with trace oozing.   Skin:    Coloration: Skin is not pale.     Findings: No rash.  Neurological:     Mental Status: He is alert and oriented to person, place, and time.  Psychiatric:        Behavior: Behavior normal.      ED Treatments / Results  Labs (all labs ordered are listed, but only abnormal results are displayed) Labs Reviewed - No data to display  EKG EKG Interpretation  Date/Time:  Wednesday March 12 2018 20:01:53 EST Ventricular Rate:  87 PR Interval:    QRS Duration: 164 QT Interval:  433 QTC Calculation: 521 R Axis:   107 Text Interpretation:  Sinus rhythm RBBB and LPFB Repol abnrm suggests ischemia, diffuse leads Baseline wander in lead(s) II III aVF V5 V6 similar to prior Confirmed by Deno Etienne [phone]) on 03/12/2018 8:05:26 PM   Radiology No results found.  Procedures Procedures (including critical care time)  Medications Ordered in ED Medications - No data to display   Initial Impression / Assessment and Plan / ED Course  I have reviewed the triage vital signs and the nursing notes.  Pertinent labs & imaging results that were available during my care of the patient were reviewed by me and considered in my medical decision making (see chart for details).     68 yo M with a cc of bleeding from a dialysis shunt access from yesterday.  Trace oozing on my exam.  Quick clot was applied and it was wrapped with hemostasis.  Patient had a episode where he felt like he was going to pass out.  EKG  with  similar finding of right bundle branch block with left posterior fascicular block.  He has no concordant ST changes.  No chest pain or shortness of breath.  His symptoms had resolved and he was able to ambulate around the ED without difficulty.  He had been diagnosed with an end STEMI recently and his EKG does look similar to his initial presenting EKG.  I discussed this with him he is not having any symptoms similar to when he had the end STEMI and currently feels better.  Felt bad for less than 10 seconds.  He is requesting discharge home and no further work-up.  PCP follow-up.  8:34 PM:  I have discussed the diagnosis/risks/treatment options with the patient and believe the pt to be eligible for discharge home to follow-up with PCP. We also discussed returning to the ED immediately if new or worsening sx occur. We discussed the sx which are most concerning (e.g., sudden worsening pain, fever, inability to tolerate by mouth) that necessitate immediate return. Medications administered to the patient during their visit and any new prescriptions provided to the patient are listed below.  Medications given during this visit Medications - No data to display    The patient appears reasonably screen and/or stabilized for discharge and I doubt any other medical condition or other [facility] requiring further screening, evaluation, or treatment in the ED at this time prior to discharge.    Final Clinical Impressions(s) / ED Diagnoses   Final diagnoses:  Bleeding from dialysis shunt, initial encounter [facility])    ED Discharge Orders    None       Deno Etienne, DO 03/12/18 2034

## 2018-03-13 ENCOUNTER — Other Ambulatory Visit: Payer: Self-pay

## 2018-03-13 ENCOUNTER — Emergency Department (HOSPITAL_COMMUNITY)
Admission: EM | Admit: 2018-03-13 | Discharge: 2018-03-14 | Disposition: A | Discharge status: Home / Self Care | Payer: Medicare Other | Attending: Emergency Medicine | Admitting: Emergency Medicine

## 2018-03-13 ENCOUNTER — Encounter (HOSPITAL_COMMUNITY): Payer: Self-pay | Admitting: Emergency Medicine

## 2018-03-13 DIAGNOSIS — T82838A Hemorrhage of vascular prosthetic devices, implants and grafts, initial encounter: Secondary | ICD-10-CM | POA: Diagnosis not present

## 2018-03-13 DIAGNOSIS — N186 End stage renal disease: Secondary | ICD-10-CM | POA: Insufficient documentation

## 2018-03-13 DIAGNOSIS — Z79899 Other long term (current) drug therapy: Secondary | ICD-10-CM | POA: Diagnosis not present

## 2018-03-13 DIAGNOSIS — Y69 Unspecified misadventure during surgical and medical care: Secondary | ICD-10-CM | POA: Diagnosis not present

## 2018-03-13 DIAGNOSIS — Z7902 Long term (current) use of antithrombotics/antiplatelets: Secondary | ICD-10-CM | POA: Insufficient documentation

## 2018-03-13 DIAGNOSIS — I2510 Atherosclerotic heart disease of native coronary artery without angina pectoris: Secondary | ICD-10-CM | POA: Diagnosis not present

## 2018-03-13 DIAGNOSIS — I5042 Chronic combined systolic (congestive) and diastolic (congestive) heart failure: Secondary | ICD-10-CM | POA: Diagnosis not present

## 2018-03-13 DIAGNOSIS — I132 Hypertensive heart and chronic kidney disease with heart failure and with stage 5 chronic kidney disease, or end stage renal disease: Secondary | ICD-10-CM | POA: Diagnosis not present

## 2018-03-13 DIAGNOSIS — Z7982 Long term (current) use of aspirin: Secondary | ICD-10-CM | POA: Insufficient documentation

## 2018-03-13 DIAGNOSIS — Z87891 Personal history of nicotine dependence: Secondary | ICD-10-CM | POA: Insufficient documentation

## 2018-03-13 NOTE — ED Triage Notes (Signed)
Pt reports bleeding to dialysis graft. Pt reports seen yesterday for same, and they applied combat gauze and dry gauze. Pt reports the dressing came off during the night and graft began oozing. Pt reports he has changed the dressing several times today.

## 2018-03-13 NOTE — Care Management (Signed)
ED CM noted patient to have had 3 ED visits in the past month, patient is ESRD on HD.  Patient may benefit from [facility] Case Management services, patient is in the waiting room will speak with patient once he is in a room.

## 2018-03-14 DIAGNOSIS — N2581 Secondary hyperparathyroidism of renal origin: Secondary | ICD-10-CM | POA: Diagnosis not present

## 2018-03-14 DIAGNOSIS — D631 Anemia in chronic kidney disease: Secondary | ICD-10-CM | POA: Diagnosis not present

## 2018-03-14 DIAGNOSIS — D509 Iron deficiency anemia, unspecified: Secondary | ICD-10-CM | POA: Diagnosis not present

## 2018-03-14 DIAGNOSIS — N186 End stage renal disease: Secondary | ICD-10-CM | POA: Diagnosis not present

## 2018-03-14 NOTE — ED Provider Notes (Signed)
[facility] EMERGENCY DEPARTMENT Provider Note  CSN: [id number] Arrival date & time: 03/13/18 1819  Chief Complaint(s) Vascular Access Problem (bleeding)  HPI Jose Garrett is a 68 y.o. male with a history of ESRD on dialysis Monday Wednesday Friday who presents to the emergency department with bleeding from the right forearm AV fistula needle insertion site.  States that he last had dialysis on Wednesday and since then has been oozing.  He is applied several bandages without relief.  Denies any blood thinners or trauma.  Denies any lightheadedness, chest pain, shortness of breath.  No fatigue.  Denies any other physical complaints.  HPI  Past Medical History Past Medical History:  Diagnosis Date  . Amyloidosis ([place])   . Anemia Dx 2016  . CAD (coronary artery disease)    a. cardiac cath on 11/25/17 with occlusion of the RCA which could have been his event but the RCA could not be engaged. The RCA filled distally from left to right collaterals. There were no severe stenoses in the left coronary system. Medical therapy recommended, started on plavix.  . Chronic combined systolic and diastolic CHF (congestive heart failure) ([place])    a. EF dropped to 35-40% with grade 2 DD by echo 11/2017.  [name] Diarrhea   . ESRD (end stage renal disease) ([place])   . Family history of adverse reaction to anesthesia    "my daughter can't take certain anesthesia agents" (09/08/2014)  . GERD (gastroesophageal reflux disease)   . History of blood transfusion 09/08/2014   "got hematoma after renal biopsy & HgB dropped"  . Hyperlipidemia Dx 2012  . Hypertension Dx 2012  . Restless legs    Patient Active Problem List   Diagnosis Date Noted  . Acute on chronic combined systolic and diastolic CHF (congestive heart failure) ([place]) 11/30/2017  . NSTEMI (non-ST elevated myocardial infarction) ([place])   . Elevated troponin 11/25/2017  . Respiratory failure ([place])   . Neuropathy 03/14/2017  . Fever   .  CKD (chronic kidney disease) stage 5, GFR less than 15 ml/min (HCC)   . ESRD (end stage renal disease) ([place])   . Acute renal failure superimposed on chronic kidney disease ([place])   . Hypervolemia   . Acute diastolic (congestive) heart failure ([place]) 01/23/2016  . Fluid overload, unspecified 01/23/2016  . Acute pulmonary edema ([place]) 01/23/2016  . Skin tear of right forearm without complication 82/99/3716  . Iron deficiency anemia 11/22/2015  . Mild aortic stenosis 08/22/2015  . Chronic kidney disease with dialysis modality undecided, stage 5 ([place]): Progressive 08/01/2015  . B12 deficiency 07/19/2015  . Seasonal allergies 06/30/2015  . Back pain 03/18/2015  . Hip pain 02/18/2015  . AL amyloidosis ([place]) 09/25/2014  . Anemia of chronic disease 09/25/2014  . Abnormal bruising 09/25/2014  . Renal hematoma, right 09/08/2014  . Chronic diastolic heart failure, NYHA class 1 ([place]) 09/08/2014  . Hematuria   . Proteinuria   . Vitamin D deficiency 01/21/2014  . Hypertriglyceridemia 01/21/2014  . Essential hypertension, benign 11/06/2013  . Dependent edema 11/06/2013  . DOE (dyspnea on exertion) 11/06/2013  . Other malaise and fatigue 11/06/2013   Home Medication(s) Prior to Admission medications   Medication Sig Start Date End Date Taking? Authorizing Provider  acetaminophen (TYLENOL) 325 MG tablet Take 325-650 mg by mouth every 6 (six) hours as needed (back pain).    [provider]  aspirin EC 81 MG tablet Take 1 tablet (81 mg total) by mouth daily. 11/30/17  Cheryln Manly, NP  atorvastatin (LIPITOR) 80 MG tablet Take 80 mg by mouth daily.    [provider]  B Complex-C-Folic Acid (DIALYVITE 818) 0.8 MG TABS Take 0.8 mg by mouth daily.    [provider]  carvedilol (COREG) 3.125 MG tablet Take 1 tablet by mouth twice a day on NON DIALYSIS DAYS ONLY Patient not taking: Reported on 02/12/2018 12/26/17   Leanor Kail, PA  Cholecalciferol (VITAMIN D3) 3000  units TABS Take 3,000 Units by mouth 3 (three) times a week.     [provider]  clopidogrel (PLAVIX) 75 MG tablet Take 1 tablet (75 mg total) by mouth daily. 12/20/17   Dunn, Nedra Hai, PA-C  cyanocobalamin 2000 MCG tablet Take 2,500 mcg by mouth daily.    [provider]  cyclobenzaprine (FLEXERIL) 5 MG tablet Take every 8 hours as needed for muscle spasms 01/07/18   Cameron Sprang, MD  ethyl chloride spray  12/20/17   [provider]  gabapentin (NEURONTIN) 300 MG capsule TAKE 1 CAPSULE BY MOUTH EVERY MORNING AND 3 CAPSULES EACH NIGHT AT BEDTIME 01/07/18   Cameron Sprang, MD  LORazepam (ATIVAN) 1 MG tablet Take 1 tablet (1 mg total) by mouth daily as needed for anxiety (nausea). 01/08/17   [name], MD  oxyCODONE (OXY IR/ROXICODONE) 5 MG immediate release tablet Take 1-2 tablets (5-10 mg total) by mouth every 6 (six) hours as needed for severe pain. 10/02/17   [name], MD  oxyCODONE (OXYCONTIN) 10 mg 12 hr tablet Take 10 mg by mouth as needed (for pain).    [provider]  VELPHORO 500 MG chewable tablet Chew 500-1,500 mg by mouth See admin instructions. Take 500mg  to 1500mg  depending on the meal size 12/28/16   [provider]                                                                                                                                    Past Surgical History Past Surgical History:  Procedure Laterality Date  . AV FISTULA PLACEMENT Right 12/27/2015   Procedure: Right Arm ARTERIOVENOUS (AV) FISTULA CREATION;  Surgeon: Angelia Mould, MD;  Location: [place];  Service: Vascular;  Laterality: Right;  . COLONOSCOPY    . IR GENERIC HISTORICAL  01/27/2016   IR FLUORO GUIDE CV LINE RIGHT 01/27/2016 Arne Cleveland, MD [facility]INTER[facility]  . IR GENERIC HISTORICAL  01/27/2016   IR US GUIDE VASC ACCESS RIGHT 01/27/2016 Arne Cleveland, MD [facility]INTER[facility]  . LAPAROSCOPIC CHOLECYSTECTOMY  03/2011  . LEFT HEART CATH AND  CORONARY ANGIOGRAPHY N/A 11/25/2017   Procedure: LEFT HEART CATH AND CORONARY ANGIOGRAPHY;  Surgeon: Martinique, Peter M, MD;  Location: [place] CV LAB;  Service: Cardiovascular;  Laterality: N/A;  . RENAL BIOPSY, PERCUTANEOUS Right 09/08/2014  . TONSILLECTOMY  ~ 29   Family History Family History  Problem Relation Age of Onset  .  Hypertension Mother   . Diabetes Mother   . Heart disease Mother   . Skin cancer Mother   . Hypertension Father   . Diabetes Father   . Diabetes Sister     Social History Social History   Tobacco Use  . Smoking status: Former Smoker    Packs/day: 2.00    Years: 40.00    Pack years: 80.00    Types: Cigarettes    Last attempt to quit: 03/19/2011    Years since quitting: 6.9  . Smokeless tobacco: Never Used  Substance Use Topics  . Alcohol use: No    Alcohol/week: 0.0 standard drinks  . Drug use: No    Comment: 09/08/2014 "I have used marijuana till the 1990's". Notes that he quit 15 yrs ago   Allergies No known allergies  Review of Systems Review of Systems All other systems are reviewed and are negative for acute change except as noted in the HPI  Physical Exam Vital Signs  I have reviewed the triage vital signs BP (!) 149/62 (BP Location: Left Arm)   Pulse 93   Temp 98.4 F (36.9 C) (Oral)   Resp 18   SpO2 98%   Physical Exam Vitals signs reviewed.  Constitutional:      General: He is not in acute distress.    Appearance: He is well-developed. He is not diaphoretic.  HENT:     Head: Normocephalic and atraumatic.     Jaw: No trismus.     Right Ear: External ear normal.     Left Ear: External ear normal.     Nose: Nose normal.  Eyes:     General: No scleral icterus.    Conjunctiva/sclera: Conjunctivae normal.  Neck:     Musculoskeletal: Normal range of motion.     Trachea: Phonation normal.  Cardiovascular:     Rate and Rhythm: Normal rate and regular rhythm.  Pulmonary:     Effort: Pulmonary effort is normal. No  respiratory distress.     Breath sounds: No stridor.  Abdominal:     General: There is no distension.  Musculoskeletal: Normal range of motion.       Arms:  Neurological:     Mental Status: He is alert and oriented to person, place, and time.  Psychiatric:        Behavior: Behavior normal.     ED Results and Treatments Labs (all labs ordered are listed, but only abnormal results are displayed) Labs Reviewed - No data to display                                                                                                                       EKG  EKG Interpretation  Date/Time:    Ventricular Rate:    PR Interval:    QRS Duration:   QT Interval:    QTC Calculation:   R Axis:     Text Interpretation:        Radiology No results found. Pertinent  labs & imaging results that were available during my care of the patient were reviewed by me and considered in my medical decision making (see chart for details).  Medications Ordered in ED Medications - No data to display                                                                                                                                  Procedures Procedures  (including critical care time)  Medical Decision Making / ED Course I have reviewed the nursing notes for this encounter and the patient's prior records (if available in EHR or on provided paperwork).    Area was bandaged while waiting in triage.  Bleeding has stopped since.  Patient has good thrill.  Decline H&H.  The patient appears reasonably screened and/or stabilized for discharge and I doubt any other medical condition or other [facility] requiring further screening, evaluation, or treatment in the ED at this time prior to discharge.  The patient is safe for discharge with strict return precautions.   Final Clinical Impression(s) / ED Diagnoses Final diagnoses:  Bleeding from dialysis shunt, initial encounter [facility])   Disposition:  Discharge  Condition: Good  I have discussed the results, Dx and Tx plan with the patient who expressed understanding and agree(s) with the plan. Discharge instructions discussed at great length. The patient was given strict return precautions who verbalized understanding of the instructions. No further questions at time of discharge.    ED Discharge Orders    None       Follow Up: [name], [place] [address] [phone]   As needed      This chart was dictated using voice recognition software.  Despite best efforts to proofread,  errors can occur which can change the documentation meaning.   Fatima Blank, MD 03/14/18 [phone]

## 2018-03-14 NOTE — ED Notes (Signed)
Reviewed d/c instructions with pt, who verbalized understanding and had no outstanding questions. Pt departed in NAD, refused use of wheelchair.   

## 2018-03-17 DIAGNOSIS — D509 Iron deficiency anemia, unspecified: Secondary | ICD-10-CM | POA: Diagnosis not present

## 2018-03-17 DIAGNOSIS — N2581 Secondary hyperparathyroidism of renal origin: Secondary | ICD-10-CM | POA: Diagnosis not present

## 2018-03-17 DIAGNOSIS — D631 Anemia in chronic kidney disease: Secondary | ICD-10-CM | POA: Diagnosis not present

## 2018-03-17 DIAGNOSIS — N186 End stage renal disease: Secondary | ICD-10-CM | POA: Diagnosis not present

## 2018-03-19 DIAGNOSIS — D631 Anemia in chronic kidney disease: Secondary | ICD-10-CM | POA: Diagnosis not present

## 2018-03-19 DIAGNOSIS — N2581 Secondary hyperparathyroidism of renal origin: Secondary | ICD-10-CM | POA: Diagnosis not present

## 2018-03-19 DIAGNOSIS — N186 End stage renal disease: Secondary | ICD-10-CM | POA: Diagnosis not present

## 2018-03-19 DIAGNOSIS — D509 Iron deficiency anemia, unspecified: Secondary | ICD-10-CM | POA: Diagnosis not present

## 2018-03-21 DIAGNOSIS — D509 Iron deficiency anemia, unspecified: Secondary | ICD-10-CM | POA: Diagnosis not present

## 2018-03-21 DIAGNOSIS — N2581 Secondary hyperparathyroidism of renal origin: Secondary | ICD-10-CM | POA: Diagnosis not present

## 2018-03-21 DIAGNOSIS — D631 Anemia in chronic kidney disease: Secondary | ICD-10-CM | POA: Diagnosis not present

## 2018-03-21 DIAGNOSIS — N186 End stage renal disease: Secondary | ICD-10-CM | POA: Diagnosis not present

## 2018-03-23 ENCOUNTER — Other Ambulatory Visit: Payer: Self-pay

## 2018-03-23 ENCOUNTER — Inpatient Hospital Stay (HOSPITAL_COMMUNITY)
Admission: EM | Admit: 2018-03-23 | Discharge: 2018-03-26 | DRG: 280 | Disposition: A | Discharge status: Home / Self Care | Payer: Medicare Other | Attending: Family Medicine | Admitting: Family Medicine

## 2018-03-23 ENCOUNTER — Emergency Department (HOSPITAL_COMMUNITY): Payer: Medicare Other

## 2018-03-23 ENCOUNTER — Encounter (HOSPITAL_COMMUNITY): Payer: Self-pay | Admitting: Emergency Medicine

## 2018-03-23 DIAGNOSIS — E8889 Other specified metabolic disorders: Secondary | ICD-10-CM | POA: Diagnosis present

## 2018-03-23 DIAGNOSIS — D696 Thrombocytopenia, unspecified: Secondary | ICD-10-CM | POA: Diagnosis present

## 2018-03-23 DIAGNOSIS — R0602 Shortness of breath: Secondary | ICD-10-CM

## 2018-03-23 DIAGNOSIS — G629 Polyneuropathy, unspecified: Secondary | ICD-10-CM | POA: Diagnosis present

## 2018-03-23 DIAGNOSIS — Z79899 Other long term (current) drug therapy: Secondary | ICD-10-CM

## 2018-03-23 DIAGNOSIS — E559 Vitamin D deficiency, unspecified: Secondary | ICD-10-CM | POA: Diagnosis present

## 2018-03-23 DIAGNOSIS — I452 Bifascicular block: Secondary | ICD-10-CM | POA: Diagnosis present

## 2018-03-23 DIAGNOSIS — E859 Amyloidosis, unspecified: Secondary | ICD-10-CM | POA: Diagnosis present

## 2018-03-23 DIAGNOSIS — I350 Nonrheumatic aortic (valve) stenosis: Secondary | ICD-10-CM | POA: Diagnosis present

## 2018-03-23 DIAGNOSIS — R011 Cardiac murmur, unspecified: Secondary | ICD-10-CM | POA: Diagnosis present

## 2018-03-23 DIAGNOSIS — I5042 Chronic combined systolic (congestive) and diastolic (congestive) heart failure: Secondary | ICD-10-CM | POA: Diagnosis not present

## 2018-03-23 DIAGNOSIS — M549 Dorsalgia, unspecified: Secondary | ICD-10-CM | POA: Diagnosis present

## 2018-03-23 DIAGNOSIS — I252 Old myocardial infarction: Secondary | ICD-10-CM

## 2018-03-23 DIAGNOSIS — F419 Anxiety disorder, unspecified: Secondary | ICD-10-CM | POA: Diagnosis present

## 2018-03-23 DIAGNOSIS — E785 Hyperlipidemia, unspecified: Secondary | ICD-10-CM | POA: Diagnosis present

## 2018-03-23 DIAGNOSIS — Z7902 Long term (current) use of antithrombotics/antiplatelets: Secondary | ICD-10-CM

## 2018-03-23 DIAGNOSIS — G2581 Restless legs syndrome: Secondary | ICD-10-CM | POA: Diagnosis present

## 2018-03-23 DIAGNOSIS — I214 Non-ST elevation (NSTEMI) myocardial infarction: Principal | ICD-10-CM | POA: Diagnosis present

## 2018-03-23 DIAGNOSIS — N186 End stage renal disease: Secondary | ICD-10-CM | POA: Diagnosis present

## 2018-03-23 DIAGNOSIS — N2581 Secondary hyperparathyroidism of renal origin: Secondary | ICD-10-CM | POA: Diagnosis present

## 2018-03-23 DIAGNOSIS — Z87891 Personal history of nicotine dependence: Secondary | ICD-10-CM

## 2018-03-23 DIAGNOSIS — Z7982 Long term (current) use of aspirin: Secondary | ICD-10-CM

## 2018-03-23 DIAGNOSIS — G8929 Other chronic pain: Secondary | ICD-10-CM | POA: Diagnosis present

## 2018-03-23 DIAGNOSIS — R079 Chest pain, unspecified: Secondary | ICD-10-CM

## 2018-03-23 DIAGNOSIS — Z992 Dependence on renal dialysis: Secondary | ICD-10-CM

## 2018-03-23 DIAGNOSIS — Z8249 Family history of ischemic heart disease and other diseases of the circulatory system: Secondary | ICD-10-CM

## 2018-03-23 DIAGNOSIS — R0600 Dyspnea, unspecified: Secondary | ICD-10-CM | POA: Diagnosis not present

## 2018-03-23 DIAGNOSIS — K219 Gastro-esophageal reflux disease without esophagitis: Secondary | ICD-10-CM | POA: Diagnosis present

## 2018-03-23 DIAGNOSIS — E538 Deficiency of other specified B group vitamins: Secondary | ICD-10-CM | POA: Diagnosis present

## 2018-03-23 DIAGNOSIS — I132 Hypertensive heart and chronic kidney disease with heart failure and with stage 5 chronic kidney disease, or end stage renal disease: Secondary | ICD-10-CM | POA: Diagnosis not present

## 2018-03-23 DIAGNOSIS — I25118 Atherosclerotic heart disease of native coronary artery with other forms of angina pectoris: Secondary | ICD-10-CM | POA: Diagnosis present

## 2018-03-23 DIAGNOSIS — Z833 Family history of diabetes mellitus: Secondary | ICD-10-CM

## 2018-03-23 DIAGNOSIS — D631 Anemia in chronic kidney disease: Secondary | ICD-10-CM | POA: Diagnosis present

## 2018-03-23 LAB — TROPONIN I
TROPONIN I: 0.6 ng/mL — AB (ref <0.03)
Troponin I: 0.1 ng/mL (ref <0.03)

## 2018-03-23 LAB — HEPATIC FUNCTION PANEL
ALK PHOS: 44 U/L (ref 38–126)
ALT: 18 U/L (ref 0–44)
AST: 22 U/L (ref 15–41)
Albumin: 3.8 g/dL (ref 3.5–5.0)
Bilirubin, Direct: 0.2 mg/dL (ref 0.0–0.2)
Indirect Bilirubin: 0.3 mg/dL (ref 0.3–0.9)
Total Bilirubin: 0.5 mg/dL (ref 0.3–1.2)
Total Protein: 7.2 g/dL (ref 6.5–8.1)

## 2018-03-23 LAB — CBC WITH DIFFERENTIAL/PLATELET
Abs Immature Granulocytes: 0.06 10*3/uL (ref 0.00–0.07)
Basophils Absolute: 0 10*3/uL (ref 0.0–0.1)
Basophils Relative: 0 %
Eosinophils Absolute: 0.2 10*3/uL (ref 0.0–0.5)
Eosinophils Relative: 3 %
HEMATOCRIT: 28.1 % — AB (ref 39.0–52.0)
Hemoglobin: 8.9 g/dL — ABNORMAL LOW (ref 13.0–17.0)
Immature Granulocytes: 1 %
LYMPHS ABS: 1.3 10*3/uL (ref 0.7–4.0)
Lymphocytes Relative: 22 %
MCH: 30.5 pg (ref 26.0–34.0)
MCHC: 31.7 g/dL (ref 30.0–36.0)
MCV: 96.2 fL (ref 80.0–100.0)
Monocytes Absolute: 0.5 10*3/uL (ref 0.1–1.0)
Monocytes Relative: 8 %
NEUTROS ABS: 3.9 10*3/uL (ref 1.7–7.7)
Neutrophils Relative %: 66 %
Platelets: 97 10*3/uL — ABNORMAL LOW (ref 150–400)
RBC: 2.92 MIL/uL — ABNORMAL LOW (ref 4.22–5.81)
RDW: 14.1 % (ref 11.5–15.5)
WBC: 6 10*3/uL (ref 4.0–10.5)
nRBC: 0 % (ref 0.0–0.2)

## 2018-03-23 LAB — BASIC METABOLIC PANEL
Anion gap: 16 — ABNORMAL HIGH (ref 5–15)
BUN: 49 mg/dL — ABNORMAL HIGH (ref 8–23)
CO2: 27 mmol/L (ref 22–32)
Calcium: 9.6 mg/dL (ref 8.9–10.3)
Chloride: 96 mmol/L — ABNORMAL LOW (ref 98–111)
Creatinine, Ser: 10.2 mg/dL — ABNORMAL HIGH (ref 0.61–1.24)
GFR calc Af Amer: 5 mL/min — ABNORMAL LOW (ref >60)
GFR calc non Af Amer: 5 mL/min — ABNORMAL LOW (ref >60)
Glucose, Bld: 108 mg/dL — ABNORMAL HIGH (ref 70–99)
Potassium: 3.9 mmol/L (ref 3.5–5.1)
Sodium: 139 mmol/L (ref 135–145)

## 2018-03-23 LAB — MAGNESIUM: Magnesium: 1.9 mg/dL (ref 1.7–2.4)

## 2018-03-23 LAB — BRAIN NATRIURETIC PEPTIDE: B Natriuretic Peptide: 362.8 pg/mL — ABNORMAL HIGH (ref 0.0–100.0)

## 2018-03-23 MED ORDER — ONDANSETRON HCL 4 MG/2ML IJ SOLN: 4.0000 mg | Freq: Four times a day (QID) | INTRAMUSCULAR | Status: DC | PRN

## 2018-03-23 MED ORDER — FUROSEMIDE 10 MG/ML IJ SOLN
40.0000 mg | Freq: Once | INTRAMUSCULAR | Status: AC
  Administered 2018-03-23: 40 mg via INTRAVENOUS
  Filled 2018-03-23: qty 4

## 2018-03-23 MED ORDER — SUCRALFATE 1 GM/10ML PO SUSP
1.0000 g | Freq: Three times a day (TID) | ORAL | Status: DC
  Administered 2018-03-23 – 2018-03-25 (×6): 1 g via ORAL
  Filled 2018-03-23 (×7): qty 10

## 2018-03-23 MED ORDER — CYCLOBENZAPRINE HCL 5 MG PO TABS
5.0000 mg | ORAL_TABLET | Freq: Two times a day (BID) | ORAL | Status: DC | PRN
  Administered 2018-03-26: 5 mg via ORAL
  Filled 2018-03-23: qty 1

## 2018-03-23 MED ORDER — RENA-VITE PO TABS
1.0000 | ORAL_TABLET | Freq: Every day | ORAL | Status: DC
  Administered 2018-03-24 – 2018-03-25 (×2): 1 via ORAL
  Filled 2018-03-23 (×2): qty 1

## 2018-03-23 MED ORDER — LACTATED RINGERS IV BOLUS
500.0000 mL | Freq: Once | INTRAVENOUS | Status: AC
  Administered 2018-03-23: 500 mL via INTRAVENOUS

## 2018-03-23 MED ORDER — CLOPIDOGREL BISULFATE 75 MG PO TABS
75.0000 mg | ORAL_TABLET | Freq: Every day | ORAL | Status: DC
  Administered 2018-03-24 – 2018-03-26 (×3): 75 mg via ORAL
  Filled 2018-03-23 (×3): qty 1

## 2018-03-23 MED ORDER — SUCROFERRIC OXYHYDROXIDE 500 MG PO CHEW
500.0000 mg | CHEWABLE_TABLET | Freq: Three times a day (TID) | ORAL | Status: DC
  Administered 2018-03-24 (×2): 500 mg via ORAL
  Administered 2018-03-25: 1000 mg via ORAL
  Filled 2018-03-23 (×9): qty 2

## 2018-03-23 MED ORDER — ATORVASTATIN CALCIUM 80 MG PO TABS
80.0000 mg | ORAL_TABLET | Freq: Every day | ORAL | Status: DC
  Administered 2018-03-23 – 2018-03-25 (×3): 80 mg via ORAL
  Filled 2018-03-23 (×3): qty 1

## 2018-03-23 MED ORDER — ACETAMINOPHEN 325 MG PO TABS
650.0000 mg | ORAL_TABLET | ORAL | Status: DC | PRN
  Administered 2018-03-23: 650 mg via ORAL
  Filled 2018-03-23: qty 2

## 2018-03-23 MED ORDER — ASPIRIN EC 81 MG PO TBEC
81.0000 mg | DELAYED_RELEASE_TABLET | Freq: Every day | ORAL | Status: DC
  Administered 2018-03-24 – 2018-03-26 (×3): 81 mg via ORAL
  Filled 2018-03-23 (×3): qty 1

## 2018-03-23 MED ORDER — GABAPENTIN 300 MG PO CAPS
300.0000 mg | ORAL_CAPSULE | Freq: Three times a day (TID) | ORAL | Status: DC
  Administered 2018-03-23 – 2018-03-26 (×8): 300 mg via ORAL
  Filled 2018-03-23 (×8): qty 1

## 2018-03-23 MED ORDER — VITAMIN B-12 1000 MCG PO TABS
2500.0000 ug | ORAL_TABLET | Freq: Every day | ORAL | Status: DC
  Administered 2018-03-24 – 2018-03-26 (×3): 2500 ug via ORAL
  Filled 2018-03-23 (×3): qty 3

## 2018-03-23 MED ORDER — HEPARIN SODIUM (PORCINE) 5000 UNIT/ML IJ SOLN
5000.0000 [IU] | Freq: Three times a day (TID) | INTRAMUSCULAR | Status: DC
  Administered 2018-03-23 – 2018-03-24 (×4): 5000 [IU] via SUBCUTANEOUS
  Filled 2018-03-23 (×4): qty 1

## 2018-03-23 NOTE — H&P (Addendum)
[facility] Admission History and Physical Service Pager: [phone]  Patient name: Jose Garrett Medical record number: [id number] Date of birth: [birth year] Age: 68 y.o. Gender: male  Primary Care Provider: [name], MD Consultants: None Code Status: Full  Chief Complaint: worsening shortness of breath on exertion  Assessment and Plan: Jose Garrett is a 68 y.o. male presenting with increasing dyspnea on exertion and chest tightness x 4 days . PMH is significant for  ESRD on HD (MWF), CHF, CAD, amyloidosis, HTN, HLD, Anxiety.  Dyspnea on Exertion Has been progressively worsening since 1/8 following HD treatment.  Has DOE at baseline, but only with a lot of exertion.  Notes that DOE does not improve with HD.  Received partial HD session on 1/10 2/2 neuropathy, but since started the day of the previous HD treatment, doubt that incomplete volume removal is main cause of DOE.  Has Chronic combined CHF and CXR showed bilateral pulmonary edema, no evidence of pneumonia or pleural effusion.  BNP 362.8, three months ago was >4000.  He received one dose Lasix 44m IV (patient does make urine) in the ED and patient states that it somewhat improved his SOB.  He does not appear fluid overloaded on exam.  Does not have a new oxygen requirement, is afebrile with WBC 6.0, no evidence of pneumonia on CXR, therefore doubt pneumonia is cause.  Wells score 0, therefore doubt PE.  Reassured as patient's O2 sats are stable on RA.  Will admit to monitor him overnight and he will receive HD tomorrow.  Suspect that patient with mild CHF exacerbation, now improved S/P Lasix, also complicated by shortened HD on 1/10. DOE may also be an anginal equivalent in this patient with known CAD, see below.  - Admit to observation, Dr. EMacario Goldsservice - monitor vitals per floor protocol - cardiac monitoring - trend troponins - AM EKG - cards consult in AM - nephro consult for HD in AM -  Renal function panel and CBC in AM - heparin for DVT PPx - Renal diet with fluid restriction - daily weights, strict I/O's - up with assistance - tyelnol prn pain - avoid zofran for nausea, as prolonged QTc of 540 on EKG  Chest Pain  CAD, Hx NSTEMI 11/2017 Follows with Dr. MAngelena Form last seen 02/12/18.  Patient not on BB due to hypotension with HD.  Recommended continuing ASA, Plavix, and statin.  Cath 11/25/2017 showed ostium RCA to mid RCA 100% stenosed, ostium circuflex to proximal circumflex 70% stenosed, was not candidate for PCI.  Trop on admission 0.1, down from 280sin September with NSTEMI.  EKG on admission without change from previous, RBBB, LBFB, repolarization abnormality, prolonged QTc.  Notes band-like pressure across chest and into neck, not always with exertion or SOB, none at present.  Has been occurring off and on since 1/8.  Heart score 7.  Wells score 0, therefore doubt PE.  CXR not suggestive of pneumonia, patient also afebrile with normal WBC.  Does have tenderness to palpation of epigastric region on exam, therefore could consider GERD.  Patient also with history of anxiety and take ativan prn, therefore could be related to anxiety.   - trend trops  - cardiac telemetry - AM EKG - cards consult in AM - cont home ASA, Plavix, Lipitor - carafate, to see if will improve pain - cont to monitor for chest pain - avoid QTc prolonging medications  Chronic Combined CHF Last Echo 12/19/2017, EF 65-70%, G1DD, moderate  AS, which was improved from his previous in September. Patient cannot tolerate BB or ACE/ARB due to hypotension with HD.  BNP 362.8 on admission with CXR with b/l pulmonary edema.  S/P Lasix 24m IV in ED.  Does not appear fluid overloaded on exam.  Could have been contributing to shortness of breath on presentation, but now improved following Lasix. - strict I/O's - diet with fluid restriction - daily weights - consult cards in AM  ESRD on HD MWF Did not receive  full treatment on Friday 1/10 due to neuropathy.  K 3.9 on admission, BUN 49, Cr 10.2, GFR 5.  Patient does make urine. - consult nephro in AM for HD - cont home velphoro, rena-vit - renal function panel in AM  Normocytic Anemia Likely 2/2 ESRD.  Hgb 8.9, baseline appears ~9.  Takes Vitamin B12 at home. - cont home Vit B12 - cont to monitor CBC  Neuropathy: Chronic BLE Takes gabapentin 3069mTID at home.  No sensation in b/l feet on exam. - cont home gabapentin  Chronic Back Pain Takes flexeril 23m823mIR PRN at home.  Also takes Oxy IR and Oxycontin prn, which he states is rarely. - cont home flexeril - hold home oxy IR and oxycontin  AL Amyloidosis Patient with negative BM bx for multiple myeloma.  Follows with Oncology, not currently on treatment.  Last seen in 09/2017. - no intervention needed at this time  HTN BP well controlled without medications, as has hypotension with HD.  Nephrology managing with HD. BP on admission 152/73. - nephro consult in AM, HD on MWF  HLD On Lipitor at home.  Last Lipid Panel 02/12/18.  Total cholesterol 117, LDL 50, Tgs 174, HDL 32. - cont home lipitor   FEN/GI: Renal with fluid restriciton Prophylaxis: Hep SubQ  Disposition: admit to obs, nephro consult for HD tomorrow and cards consult in AM  History of Present Illness:  Jose Garrett a 67 20o. male presenting with 4 days of shortness and breath and intermittent chest pain.  He was in his normal state of health until 1/8.  Patient states that following HD on 1/8, he noticed some increased DOE.  He states that he usually has DOE only when he exerting himself a lot.  Since 1/8, DOE has been getting progressively worse.  He went to HD on 1/10, but session was cut short 2/2 neuropathy pain.  States that HD did not improve his DOE, however.  Today, patient states that he was walking around a lot while he was shopping and had to frequently stop due to SOB.  He states, "I was worried, but last time  I was short of breath like this was when I had my heart attack."    He also states that since 1/8 he has had occasional chest tightness.  It is not brought on by exertion, is not always associated with shortness of breath.  No alleviating or agravating factors.  He states that it feels like a tight band across his chest and going up into his neck.  Notes that he has not had this pain prior to 1/8.  In ED, patient noted to have pulmonary edema on CXR and was given a dose of Lasix. Patient was then ambulated and experienced chest tightness with walking 5 feet.  Decision was made to admit for observation given chest pain and history of ACS.  Review Of Systems: Per HPI with the following additions:   Review of Systems  Constitutional:  Negative for chills and fever.  HENT: Negative for congestion and sore throat.   Eyes: Negative for blurred vision.  Respiratory: Positive for shortness of breath. Negative for cough.   Cardiovascular: Positive for chest pain. Negative for orthopnea and leg swelling.  Gastrointestinal: Negative for abdominal pain, constipation, diarrhea, nausea and vomiting.  Musculoskeletal: Positive for back pain.  Neurological: Positive for tingling. Negative for focal weakness and headaches.    Patient Active Problem List   Diagnosis Date Noted  . Shortness of breath 03/23/2018  . Acute on chronic combined systolic and diastolic CHF (congestive heart failure) ([place]) 11/30/2017  . NSTEMI (non-ST elevated myocardial infarction) ([place])   . Elevated troponin 11/25/2017  . Respiratory failure ([place])   . Neuropathy 03/14/2017  . Fever   . CKD (chronic kidney disease) stage 5, GFR less than 15 ml/min (HCC)   . ESRD (end stage renal disease) ([place])   . Acute renal failure superimposed on chronic kidney disease ([place])   . Hypervolemia   . Acute diastolic (congestive) heart failure ([place]) 01/23/2016  . Fluid overload, unspecified 01/23/2016  . Acute pulmonary edema ([place]) 01/23/2016   . Skin tear of right forearm without complication 91/50/5697  . Iron deficiency anemia 11/22/2015  . Mild aortic stenosis 08/22/2015  . Chronic kidney disease with dialysis modality undecided, stage 5 ([place]): Progressive 08/01/2015  . B12 deficiency 07/19/2015  . Seasonal allergies 06/30/2015  . Back pain 03/18/2015  . Hip pain 02/18/2015  . AL amyloidosis ([place]) 09/25/2014  . Anemia of chronic disease 09/25/2014  . Abnormal bruising 09/25/2014  . Renal hematoma, right 09/08/2014  . Chronic diastolic heart failure, NYHA class 1 ([place]) 09/08/2014  . Hematuria   . Proteinuria   . Vitamin D deficiency 01/21/2014  . Hypertriglyceridemia 01/21/2014  . Essential hypertension, benign 11/06/2013  . Dependent edema 11/06/2013  . DOE (dyspnea on exertion) 11/06/2013  . Other malaise and fatigue 11/06/2013    Past Medical History: Past Medical History:  Diagnosis Date  . Amyloidosis ([place])   . Anemia Dx 2016  . CAD (coronary artery disease)    a. cardiac cath on 11/25/17 with occlusion of the RCA which could have been his event but the RCA could not be engaged. The RCA filled distally from left to right collaterals. There were no severe stenoses in the left coronary system. Medical therapy recommended, started on plavix.  . Chronic combined systolic and diastolic CHF (congestive heart failure) ([place])    a. EF dropped to 35-40% with grade 2 DD by echo 11/2017.  [name] Diarrhea   . ESRD (end stage renal disease) ([place])   . Family history of adverse reaction to anesthesia    "my daughter can't take certain anesthesia agents" (09/08/2014)  . GERD (gastroesophageal reflux disease)   . History of blood transfusion 09/08/2014   "got hematoma after renal biopsy & HgB dropped"  . Hyperlipidemia Dx 2012  . Hypertension Dx 2012  . Restless legs     Past Surgical History: Past Surgical History:  Procedure Laterality Date  . AV FISTULA PLACEMENT Right 12/27/2015   Procedure: Right Arm ARTERIOVENOUS (AV)  FISTULA CREATION;  Surgeon: Angelia Mould, MD;  Location: [facility];  Service: Vascular;  Laterality: Right;  . COLONOSCOPY    . IR GENERIC HISTORICAL  01/27/2016   IR FLUORO GUIDE CV LINE RIGHT 01/27/2016 Arne Cleveland, MD [facility]  . IR GENERIC HISTORICAL  01/27/2016   IR US GUIDE VASC ACCESS RIGHT 01/27/2016 Arne Cleveland, MD [facility]  .  LAPAROSCOPIC CHOLECYSTECTOMY  03/2011  . LEFT HEART CATH AND CORONARY ANGIOGRAPHY N/A 11/25/2017   Procedure: LEFT HEART CATH AND CORONARY ANGIOGRAPHY;  Surgeon: Martinique, Peter M, MD;  Location: [facility];  Service: Cardiovascular;  Laterality: N/A;  . RENAL BIOPSY, PERCUTANEOUS Right 09/08/2014  . TONSILLECTOMY  ~ 6    Social History: Social History   Tobacco Use  . Smoking status: Former Smoker    Packs/day: 2.00    Years: 40.00    Pack years: 80.00    Types: Cigarettes    Last attempt to quit: 03/19/2011    Years since quitting: 7.0  . Smokeless tobacco: Never Used  Substance Use Topics  . Alcohol use: No    Alcohol/week: 0.0 standard drinks  . Drug use: No    Comment: 09/08/2014 "I have used marijuana till the 1990's". Notes that he quit 15 yrs ago   Additional social history: lives alone, but has support, uses a cane to ambulate at baseline due to neuropathy   Please also refer to relevant sections of EMR.  Family History: Family History  Problem Relation Age of Onset  . Hypertension Mother   . Diabetes Mother   . Heart disease Mother   . Skin cancer Mother   . Hypertension Father   . Diabetes Father   . Diabetes Sister     Allergies and Medications: Allergies  Allergen Reactions  . No Known Allergies    No current facility-administered medications on file prior to encounter.    Current Outpatient Medications on File Prior to Encounter  Medication Sig Dispense Refill  . acetaminophen (TYLENOL) 325 MG tablet Take 325-650 mg by mouth every 6 (six) hours as needed (back pain).    [name] aspirin EC 81 MG  tablet Take 1 tablet (81 mg total) by mouth daily. (Patient taking differently: Take 81 mg by mouth See admin instructions. Take one tablet (81 mg) by mouth on Sunday, Tuesday, Thursday, Saturday mornings, take one tablet (81 mg) with supper on Monday, Wednesday, Friday if no bleeding that morning at dialysis) 30 tablet 11  . atorvastatin (LIPITOR) 80 MG tablet Take 80 mg by mouth daily with supper.     . B Complex-C-Folic Acid (DIALYVITE 354) 0.8 MG TABS Take 0.8 mg by mouth daily.    . clopidogrel (PLAVIX) 75 MG tablet Take 1 tablet (75 mg total) by mouth daily. 90 tablet 3  . Cyanocobalamin 2500 MCG TABS Take 2,500 mcg by mouth daily.     . cyclobenzaprine (FLEXERIL) 5 MG tablet Take every 8 hours as needed for muscle spasms (Patient taking differently: Take 5 mg by mouth 2 (two) times daily. ) 30 tablet 11  . ethyl chloride spray Apply 1 application topically See admin instructions. Spray on arm immediately prior to dialysis on Monday, Wednesday and Friday    . gabapentin (NEURONTIN) 300 MG capsule TAKE 1 CAPSULE BY MOUTH EVERY MORNING AND 3 CAPSULES EACH NIGHT AT BEDTIME (Patient taking differently: Take 300 mg by mouth 3 (three) times daily with meals. ) 120 capsule 11  . LORazepam (ATIVAN) 1 MG tablet Take 1 tablet (1 mg total) by mouth daily as needed for anxiety (nausea). 30 tablet 0  . oxyCODONE (OXY IR/ROXICODONE) 5 MG immediate release tablet Take 1-2 tablets (5-10 mg total) by mouth every 6 (six) hours as needed for severe pain. (Patient taking differently: Take 2.5-5 mg by mouth at bedtime as needed (pain/leg spasms). ) 60 tablet 0  . oxyCODONE (OXYCONTIN) 10 mg  12 hr tablet Take 10 mg by mouth daily as needed (severe pain).     . sucroferric oxyhydroxide (VELPHORO) 500 MG chewable tablet Chew 500-1,000 mg by mouth See admin instructions. Mix 2 tablets (1000 mg) in applesauce and take with full size meal, mix 1 - 1/2 tablets (500 - 750 mg) in applesauce for  light meals.    . carvedilol  (COREG) 3.125 MG tablet Take 1 tablet by mouth twice a day on NON DIALYSIS DAYS ONLY (Patient not taking: Reported on 02/12/2018) 180 tablet 3    Objective: BP (!) 154/70   Pulse 96   Temp 98 F (36.7 C) (Oral)   Resp (!) 22   SpO2 94%   Physical Exam: General: 68 y.o. male in NAD, sitting up on edge of bed HEENT: NCAT, MMM Neck: full ROM Cardio: RRR 2/6 systolic murmur at left upper sternal border, 2+ pulses dorsalis pedis, right lower AVG with bruit Chest: no TTP chest wall Lungs: CTAB, no wheezing, no rhonchi, no crackles, no increased work of breathing, on RA Abdomen: Soft, mildly TTP of epigastric region, positive bowel sounds Skin: warm and dry, no lesions noted Extremities: No edema, no sensation b/l feet   Labs and Imaging: CBC BMET  Recent Labs  Lab 03/23/18 1620  WBC 6.0  HGB 8.9*  HCT 28.1*  PLT 97*   Recent Labs  Lab 03/23/18 1620  NA 139  K 3.9  CL 96*  CO2 27  BUN 49*  CREATININE 10.20*  GLUCOSE 108*  CALCIUM 9.6     Dg Chest 2 View  Result Date: 03/23/2018 CLINICAL DATA:  Acute onset of shortness of breath 4 days ago, worse with exertion. Dialysis treatment 2 days ago was shortened due to neuropathic pain. Current history of CHF. EXAM: CHEST - 2 VIEW COMPARISON:  11/27/2017 and earlier. FINDINGS: Cardiac silhouette mildly to moderately enlarged, unchanged. Thoracic aorta mildly atherosclerotic, unchanged. Hilar and mediastinal contours otherwise unremarkable. Minimal diffuse interstitial pulmonary edema as evidenced by Awanda Mink B lines. Lungs otherwise clear. No pleural effusions. No confluent airspace consolidation. Visualized bony thorax intact. IMPRESSION: Minimal acute CHF, with stable mild to moderate cardiomegaly and minimal diffuse interstitial pulmonary edema. Electronically Signed   By: Evangeline Dakin M.D.   On: 03/23/2018 17:26    Martell, Bernita Raisin, DO 03/23/2018, 7:34 PM PGY-1, [place] Intern pager: [phone],  text pages welcome  FPTS Upper-Level Resident Addendum   I have independently interviewed and examined the patient. I have discussed the above with the original author and agree with their documentation. My edits for correction/addition/clarification are in blue. Please see also any attending notes.    Ralene Ok, MD PGY-3, [place] Service pager: [phone] (text pages welcome through [facility])

## 2018-03-23 NOTE — Plan of Care (Signed)
  Problem: Education: Goal: Knowledge of General Education information will improve Description: Including pain rating scale, medication(s)/side effects and non-pharmacologic comfort measures Outcome: Progressing   Problem: Safety: Goal: Ability to remain free from injury will improve Outcome: Progressing   

## 2018-03-23 NOTE — ED Provider Notes (Signed)
[facility] DEPARTMENT Provider Note   CSN: [id number] Arrival date & time: 03/23/18  1548     History   Chief Complaint Chief Complaint  Patient presents with  . Shortness of Breath    HPI Jose Garrett is a 68 y.o. male.  HPI  Patient is a 68 year old male with a past medical history of CAD, amyloidosis , CHF, HTN, HDL, and ESRD on HD MWF (he states he cut his appointment short on 1/10 and did not complete a full session due to neuropathic pain in his neck and extremities) who presents for evaluation of progressively worsening shortness of breath that began on 1/8.  Patient states it is exacerbated by exertion but denies any other alleviating or aggravating factors including positional.  Denies any fevers or cough, earache, vertigo, vision changes, vomiting, diarrhea, blood in his stool, blood in his urine, dysuria, rash, focal extremity numbness or tingling, or other acute complaints.  Denies prior similar episodes.   Past Medical History:  Diagnosis Date  . Amyloidosis ([place])   . Anemia Dx 2016  . CAD (coronary artery disease)    a. cardiac cath on 11/25/17 with occlusion of the RCA which could have been his event but the RCA could not be engaged. The RCA filled distally from left to right collaterals. There were no severe stenoses in the left coronary system. Medical therapy recommended, started on plavix.  . Chronic combined systolic and diastolic CHF (congestive heart failure) ([place])    a. EF dropped to 35-40% with grade 2 DD by echo 11/2017.  [name] Diarrhea   . ESRD (end stage renal disease) ([place])   . Family history of adverse reaction to anesthesia    "my daughter can't take certain anesthesia agents" (09/08/2014)  . GERD (gastroesophageal reflux disease)   . History of blood transfusion 09/08/2014   "got hematoma after renal biopsy & HgB dropped"  . Hyperlipidemia Dx 2012  . Hypertension Dx 2012  . Restless legs     Patient Active Problem List     Diagnosis Date Noted  . Shortness of breath 03/23/2018  . Acute on chronic combined systolic and diastolic CHF (congestive heart failure) ([place]) 11/30/2017  . NSTEMI (non-ST elevated myocardial infarction) ([place])   . Elevated troponin 11/25/2017  . Respiratory failure ([place])   . Neuropathy 03/14/2017  . Fever   . CKD (chronic kidney disease) stage 5, GFR less than 15 ml/min (HCC)   . ESRD (end stage renal disease) ([place])   . Acute renal failure superimposed on chronic kidney disease ([place])   . Hypervolemia   . Acute diastolic (congestive) heart failure ([place]) 01/23/2016  . Fluid overload, unspecified 01/23/2016  . Acute pulmonary edema ([place]) 01/23/2016  . Skin tear of right forearm without complication 40/12/2723  . Iron deficiency anemia 11/22/2015  . Mild aortic stenosis 08/22/2015  . Chronic kidney disease with dialysis modality undecided, stage 5 ([place]): Progressive 08/01/2015  . B12 deficiency 07/19/2015  . Seasonal allergies 06/30/2015  . Back pain 03/18/2015  . Hip pain 02/18/2015  . AL amyloidosis ([place]) 09/25/2014  . Anemia of chronic disease 09/25/2014  . Abnormal bruising 09/25/2014  . Renal hematoma, right 09/08/2014  . Chronic diastolic heart failure, NYHA class 1 ([place]) 09/08/2014  . Hematuria   . Proteinuria   . Vitamin D deficiency 01/21/2014  . Hypertriglyceridemia 01/21/2014  . Essential hypertension, benign 11/06/2013  . Dependent edema 11/06/2013  . DOE (dyspnea on exertion) 11/06/2013  . Other malaise and  fatigue 11/06/2013    Past Surgical History:  Procedure Laterality Date  . AV FISTULA PLACEMENT Right 12/27/2015   Procedure: Right Arm ARTERIOVENOUS (AV) FISTULA CREATION;  Surgeon: Angelia Mould, MD;  Location: [facility];  Service: Vascular;  Laterality: Right;  . COLONOSCOPY    . IR GENERIC HISTORICAL  01/27/2016   IR FLUORO GUIDE CV LINE RIGHT 01/27/2016 Arne Cleveland, MD [facility]  . IR GENERIC HISTORICAL  01/27/2016   IR US GUIDE VASC  ACCESS RIGHT 01/27/2016 Arne Cleveland, MD [facility]  . LAPAROSCOPIC CHOLECYSTECTOMY  03/2011  . LEFT HEART CATH AND CORONARY ANGIOGRAPHY N/A 11/25/2017   Procedure: LEFT HEART CATH AND CORONARY ANGIOGRAPHY;  Surgeon: Martinique, Peter M, MD;  Location: [facility];  Service: Cardiovascular;  Laterality: N/A;  . RENAL BIOPSY, PERCUTANEOUS Right 09/08/2014  . TONSILLECTOMY  ~ 1960        Home Medications    Prior to Admission medications   Medication Sig Start Date End Date Taking? Authorizing Provider  acetaminophen (TYLENOL) 325 MG tablet Take 325-650 mg by mouth every 6 (six) hours as needed (back pain).   Yes [provider]  aspirin EC 81 MG tablet Take 1 tablet (81 mg total) by mouth daily. Patient taking differently: Take 81 mg by mouth See admin instructions. Take one tablet (81 mg) by mouth on Sunday, Tuesday, Thursday, Saturday mornings, take one tablet (81 mg) with supper on Monday, Wednesday, Friday if no bleeding that morning at dialysis 11/30/17  Yes Reino Bellis B, NP  atorvastatin (LIPITOR) 80 MG tablet Take 80 mg by mouth daily with supper.    Yes [provider]  B Complex-C-Folic Acid (DIALYVITE 024) 0.8 MG TABS Take 0.8 mg by mouth daily.   Yes [provider]  clopidogrel (PLAVIX) 75 MG tablet Take 1 tablet (75 mg total) by mouth daily. 12/20/17  Yes Dunn, Dayna N, PA-C  Cyanocobalamin 2500 MCG TABS Take 2,500 mcg by mouth daily.    Yes [provider]  cyclobenzaprine (FLEXERIL) 5 MG tablet Take every 8 hours as needed for muscle spasms Patient taking differently: Take 5 mg by mouth 2 (two) times daily.  01/07/18  Yes Cameron Sprang, MD  ethyl chloride spray Apply 1 application topically See admin instructions. Spray on arm immediately prior to dialysis on Monday, Wednesday and Friday   Yes [provider]  gabapentin (NEURONTIN) 300 MG capsule TAKE 1 CAPSULE BY MOUTH EVERY MORNING AND 3 CAPSULES EACH NIGHT AT  BEDTIME Patient taking differently: Take 300 mg by mouth 3 (three) times daily with meals.  01/07/18  Yes Cameron Sprang, MD  LORazepam (ATIVAN) 1 MG tablet Take 1 tablet (1 mg total) by mouth daily as needed for anxiety (nausea). 01/08/17  Yes [name], MD  oxyCODONE (OXY IR/ROXICODONE) 5 MG immediate release tablet Take 1-2 tablets (5-10 mg total) by mouth every 6 (six) hours as needed for severe pain. Patient taking differently: Take 2.5-5 mg by mouth at bedtime as needed (pain/leg spasms).  10/02/17  Yes [name], MD  oxyCODONE (OXYCONTIN) 10 mg 12 hr tablet Take 10 mg by mouth daily as needed (severe pain).    Yes [provider]  sucroferric oxyhydroxide (VELPHORO) 500 MG chewable tablet Chew 500-1,000 mg by mouth See admin instructions. Mix 2 tablets (1000 mg) in applesauce and take with full size meal, take 1 - 1/2 tablets (500 - 750 mg) with light meal.   Yes [provider]  carvedilol (COREG) 3.125 MG tablet Take 1 tablet by mouth twice a day on NON DIALYSIS DAYS ONLY Patient not taking: Reported on 02/12/2018 12/26/17   Leanor Kail, PA    Family History Family History  Problem Relation Age of Onset  . Hypertension Mother   . Diabetes Mother   . Heart disease Mother   . Skin cancer Mother   . Hypertension Father   . Diabetes Father   . Diabetes Sister     Social History Social History   Tobacco Use  . Smoking status: Former Smoker    Packs/day: 2.00    Years: 40.00    Pack years: 80.00    Types: Cigarettes    Last attempt to quit: 03/19/2011    Years since quitting: 7.0  . Smokeless tobacco: Never Used  Substance Use Topics  . Alcohol use: No    Alcohol/week: 0.0 standard drinks  . Drug use: No    Comment: 09/08/2014 "I have used marijuana till the 1990's". Notes that he quit 15 yrs ago     Allergies   No known allergies   Review of Systems Review of Systems  Constitutional: Negative for chills and fever.   HENT: Negative for ear pain and sore throat.   Eyes: Negative for pain and visual disturbance.  Respiratory: Positive for shortness of breath. Negative for cough.   Cardiovascular: Negative for chest pain and palpitations.  Gastrointestinal: Negative for abdominal pain and vomiting.  Genitourinary: Negative for dysuria and hematuria.  Musculoskeletal: Negative for arthralgias and back pain.  Skin: Negative for color change and rash.  Neurological: Negative for seizures and syncope.  All other systems reviewed and are negative.   Physical Exam Updated Vital Signs BP (!) 154/70   Pulse 96   Temp 98 F (36.7 C) (Oral)   Resp (!) 22   SpO2 94%   Physical Exam Vitals signs and nursing note reviewed.  Constitutional:      Appearance: He is well-developed and normal weight.  HENT:     Head: Normocephalic and atraumatic.  Eyes:     Conjunctiva/sclera: Conjunctivae normal.  Neck:     Musculoskeletal: Neck supple.  Cardiovascular:     Rate and Rhythm: Normal rate and regular rhythm.     Pulses: Normal pulses.     Heart sounds: Murmur present.  Pulmonary:     Effort: Pulmonary effort is normal. No respiratory distress.     Breath sounds: Normal breath sounds.  Abdominal:     Palpations: Abdomen is soft.     Tenderness: There is no abdominal tenderness.  Musculoskeletal:     Right lower leg: Edema present.     Left lower leg: Edema present.  Skin:    General: Skin is warm and dry.     Capillary Refill: Capillary refill takes less than 2 seconds.  Neurological:     Mental Status: He is alert.     ED Treatments / Results  Labs (all labs ordered are listed, but only abnormal results are displayed) Labs Reviewed  BASIC METABOLIC PANEL - Abnormal; Notable for the following components:      Result Value   Chloride 96 (*)    Glucose, Bld 108 (*)    BUN 49 (*)    Creatinine, Ser 10.20 (*)    GFR calc non Af Amer 5 (*)    GFR calc Af Amer 5 (*)    Anion gap 16 (*)    All  other components within normal limits  CBC WITH DIFFERENTIAL/PLATELET - Abnormal; Notable for the following components:   RBC 2.92 (*)    Hemoglobin 8.9 (*)    HCT 28.1 (*)    Platelets 97 (*)    All other components within normal limits  TROPONIN I - Abnormal; Notable for the following components:   Troponin I 0.10 (*)    All other components within normal limits  BRAIN NATRIURETIC PEPTIDE - Abnormal; Notable for the following components:   B Natriuretic Peptide 362.8 (*)    All other components within normal limits  HEPATIC FUNCTION PANEL  MAGNESIUM    EKG EKG Interpretation  Date/Time:  Sunday March 23 2018 15:57:45 EST Ventricular Rate:  97 PR Interval:    QRS Duration: 148 QT Interval:  425 QTC Calculation: 540 R Axis:   104 Text Interpretation:  Sinus rhythm Short PR interval RBBB and LPFB Repol abnrm suggests ischemia, diffuse leads No significant change since last tracing Confirmed by Isla Pence [phone]) on 03/23/2018 4:09:46 PM Also confirmed by Isla Pence [phone]), editor Philomena Doheny [phone])  on 03/23/2018 4:56:01 PM   Radiology Dg Chest 2 View  Result Date: 03/23/2018 CLINICAL DATA:  Acute onset of shortness of breath 4 days ago, worse with exertion. Dialysis treatment 2 days ago was shortened due to neuropathic pain. Current history of CHF. EXAM: CHEST - 2 VIEW COMPARISON:  11/27/2017 and earlier. FINDINGS: Cardiac silhouette mildly to moderately enlarged, unchanged. Thoracic aorta mildly atherosclerotic, unchanged. Hilar and mediastinal contours otherwise unremarkable. Minimal diffuse interstitial pulmonary edema as evidenced by Awanda Mink B lines. Lungs otherwise clear. No pleural effusions. No confluent airspace consolidation. Visualized bony thorax intact. IMPRESSION: Minimal acute CHF, with stable mild to moderate cardiomegaly and minimal diffuse interstitial pulmonary edema. Electronically Signed   By: Evangeline Dakin M.D.   On: 03/23/2018 17:26     Procedures Procedures (including critical care time)  Medications Ordered in ED Medications  lactated ringers bolus 500 mL (0 mLs Intravenous Stopped 03/23/18 1802)  furosemide (LASIX) injection 40 mg (40 mg Intravenous Given 03/23/18 1802)     Initial Impression / Assessment and Plan / ED Course  I have reviewed the triage vital signs and the nursing notes.  Pertinent labs & imaging results that were available during my care of the patient were reviewed by me and considered in my medical decision making (see chart for details).     Patient is a 68 year old male who presents with above-stated history exam for evaluation of shortness of breath.  On presentation patient is afebrile stable vital signs.  I have a low suspicion for ACS given patient denies any chest pain ECG shows no significant change from prior tracings but does show a prolonged QTC of 540, right bundle branch block, left posterior fascicle block, and shortened PR interval.  Chest x-ray shows bilateral pulmonary edema without any focal consolidation suggestive of pneumonia or pleural effusion.  BMP shows normal sodium potassium with creatinine and anion gap consistent with prior levels and consistent with history of ESRD.  CBC shows a WBC count of 6, hemoglobin of 8.9 which is consistent with prior levels, and platelets of 97.  Troponin is 0.10 which is the lowest it has been in the last 3 months as patient had several levels greater than 23 months ago.  Hepatic function panel is WNL.  Magnesium 1.9.  BNP is 362.8.  On reassessment patient was ambulated in the hallway but stated he developed some chest pressure after approximately 5 feet of walking.  Patient  states he can only ambulate approximately 1/4 mile without any chest tightness.  This is concerning for unstable angina.  Patient given Lasix for concern for heart failure exacerbation with pulmonary edema on chest x-ray.  History exam is not consistent with PE or aortic  dissection.  History exam is not consistent with sepsis, meningitis, or other imminently life-threatening etiology.  Patient admitted to hospital service in stable condition for further evaluation and management.  Final Clinical Impressions(s) / ED Diagnoses   Final diagnoses:  Dyspnea, unspecified type    ED Discharge Orders    None       Hulan Saas, MD 03/23/18 Paulette Blanch    Isla Pence, MD 03/23/18 1901

## 2018-03-23 NOTE — ED Triage Notes (Signed)
Pt c/o shortness of breath since Wednesday, worse on exertion. Dialysis MWF, last treatment Friday, but was cut short d/t neuropathy pain. Denies chest pain/fevers/cough. Hx CHF

## 2018-03-23 NOTE — ED Notes (Addendum)
Pt ambulated with use of a cane. Pt experienced no difficulty ambulating. Pt O2 sat @ 94% RA @ bedside and increased to 100% RA while ambulating. Pt did complain of tightness within his upper chest and throat. He stated he feels as if he is winded. Pt returned safely to bedside.

## 2018-03-24 ENCOUNTER — Observation Stay (HOSPITAL_BASED_OUTPATIENT_CLINIC_OR_DEPARTMENT_OTHER): Payer: Medicare Other

## 2018-03-24 DIAGNOSIS — R7989 Other specified abnormal findings of blood chemistry: Secondary | ICD-10-CM

## 2018-03-24 DIAGNOSIS — I350 Nonrheumatic aortic (valve) stenosis: Secondary | ICD-10-CM

## 2018-03-24 DIAGNOSIS — I340 Nonrheumatic mitral (valve) insufficiency: Secondary | ICD-10-CM

## 2018-03-24 DIAGNOSIS — D631 Anemia in chronic kidney disease: Secondary | ICD-10-CM | POA: Diagnosis not present

## 2018-03-24 DIAGNOSIS — R0602 Shortness of breath: Secondary | ICD-10-CM | POA: Diagnosis not present

## 2018-03-24 DIAGNOSIS — I5043 Acute on chronic combined systolic (congestive) and diastolic (congestive) heart failure: Secondary | ICD-10-CM

## 2018-03-24 DIAGNOSIS — I120 Hypertensive chronic kidney disease with stage 5 chronic kidney disease or end stage renal disease: Secondary | ICD-10-CM | POA: Diagnosis not present

## 2018-03-24 DIAGNOSIS — I25110 Atherosclerotic heart disease of native coronary artery with unstable angina pectoris: Secondary | ICD-10-CM

## 2018-03-24 DIAGNOSIS — R0600 Dyspnea, unspecified: Secondary | ICD-10-CM | POA: Diagnosis not present

## 2018-03-24 DIAGNOSIS — R072 Precordial pain: Secondary | ICD-10-CM | POA: Diagnosis not present

## 2018-03-24 DIAGNOSIS — R079 Chest pain, unspecified: Secondary | ICD-10-CM | POA: Diagnosis not present

## 2018-03-24 DIAGNOSIS — Z992 Dependence on renal dialysis: Secondary | ICD-10-CM | POA: Diagnosis not present

## 2018-03-24 DIAGNOSIS — N2581 Secondary hyperparathyroidism of renal origin: Secondary | ICD-10-CM | POA: Diagnosis not present

## 2018-03-24 DIAGNOSIS — N186 End stage renal disease: Secondary | ICD-10-CM | POA: Diagnosis not present

## 2018-03-24 LAB — CBC
HCT: 26.3 % — ABNORMAL LOW (ref 39.0–52.0)
Hemoglobin: 8.4 g/dL — ABNORMAL LOW (ref 13.0–17.0)
MCH: 30.8 pg (ref 26.0–34.0)
MCHC: 31.9 g/dL (ref 30.0–36.0)
MCV: 96.3 fL (ref 80.0–100.0)
NRBC: 0 % (ref 0.0–0.2)
PLATELETS: 87 10*3/uL — AB (ref 150–400)
RBC: 2.73 MIL/uL — ABNORMAL LOW (ref 4.22–5.81)
RDW: 14.4 % (ref 11.5–15.5)
WBC: 5.8 10*3/uL (ref 4.0–10.5)

## 2018-03-24 LAB — TROPONIN I
TROPONIN I: 5.93 ng/mL — AB (ref <0.03)
Troponin I: 2.56 ng/mL (ref <0.03)
Troponin I: 3.84 ng/mL (ref <0.03)
Troponin I: 5.13 ng/mL (ref <0.03)

## 2018-03-24 LAB — HIV ANTIBODY (ROUTINE TESTING W REFLEX): HIV Screen 4th Generation wRfx: NONREACTIVE

## 2018-03-24 LAB — RENAL FUNCTION PANEL
Albumin: 3.6 g/dL (ref 3.5–5.0)
Anion gap: 13 (ref 5–15)
BUN: 56 mg/dL — ABNORMAL HIGH (ref 8–23)
CO2: 28 mmol/L (ref 22–32)
Calcium: 9.2 mg/dL (ref 8.9–10.3)
Chloride: 97 mmol/L — ABNORMAL LOW (ref 98–111)
Creatinine, Ser: 11.1 mg/dL — ABNORMAL HIGH (ref 0.61–1.24)
GFR calc Af Amer: 5 mL/min — ABNORMAL LOW (ref >60)
GFR calc non Af Amer: 4 mL/min — ABNORMAL LOW (ref >60)
Glucose, Bld: 106 mg/dL — ABNORMAL HIGH (ref 70–99)
Phosphorus: 5.4 mg/dL — ABNORMAL HIGH (ref 2.5–4.6)
Potassium: 4.2 mmol/L (ref 3.5–5.1)
Sodium: 138 mmol/L (ref 135–145)

## 2018-03-24 LAB — ECHOCARDIOGRAM COMPLETE
Height: 70 in
Weight: 2910.07 oz

## 2018-03-24 LAB — MRSA PCR SCREENING: MRSA by PCR: NEGATIVE

## 2018-03-24 MED ORDER — HEPARIN SODIUM (PORCINE) 1000 UNIT/ML DIALYSIS: 1000.0000 [IU] | INTRAMUSCULAR | Status: DC | PRN

## 2018-03-24 MED ORDER — ALTEPLASE 2 MG IJ SOLR: 2.0000 mg | Freq: Once | INTRAMUSCULAR | Status: DC | PRN

## 2018-03-24 MED ORDER — PENTAFLUOROPROP-TETRAFLUOROETH EX AERO: 1.0000 "application " | INHALATION_SPRAY | CUTANEOUS | Status: DC | PRN

## 2018-03-24 MED ORDER — LIDOCAINE-PRILOCAINE 2.5-2.5 % EX CREA: 1.0000 "application " | TOPICAL_CREAM | CUTANEOUS | Status: DC | PRN

## 2018-03-24 MED ORDER — HEPARIN (PORCINE) 25000 UT/250ML-% IV SOLN
1150.0000 [IU]/h | INTRAVENOUS | Status: DC
  Administered 2018-03-25: 1150 [IU]/h via INTRAVENOUS
  Filled 2018-03-24: qty 250

## 2018-03-24 MED ORDER — CHLORHEXIDINE GLUCONATE CLOTH 2 % EX PADS: 6.0000 | MEDICATED_PAD | Freq: Every day | CUTANEOUS | Status: DC

## 2018-03-24 MED ORDER — SODIUM CHLORIDE 0.9 % IV SOLN: 100.0000 mL | INTRAVENOUS | Status: DC | PRN

## 2018-03-24 MED ORDER — HEPARIN BOLUS VIA INFUSION
4000.0000 [IU] | Freq: Once | INTRAVENOUS | Status: AC
  Administered 2018-03-25: 4000 [IU] via INTRAVENOUS
  Filled 2018-03-24: qty 4000

## 2018-03-24 MED ORDER — LIDOCAINE HCL (PF) 1 % IJ SOLN: 5.0000 mL | INTRAMUSCULAR | Status: DC | PRN

## 2018-03-24 NOTE — Progress Notes (Signed)
Family Medicine Teaching Service Daily Progress Note Intern Pager: [phone]  Patient name: Jose Garrett Medical record number: [id number] Date of birth: 08-14-1950 Age: 69 y.o. Gender: male  Primary Care Provider: [name], MD Consultants: cards, nephro Code Status: full  Pt Overview and Major Events to Date:  1/12 admitted  Assessment and Plan: Jose Garrett is a 68 y.o. male presenting with increasing dyspnea on exertion and chest tightness x 4 days . PMH is significant for  ESRD on HD (MWF), CHF, CAD, amyloidosis, HTN, HLD, Anxiety.  Dyspnea on Exertion- today, patient states: he has not been up and walking today to give everyone else a chance to wake up so not sure if he's still DOE. He denies current SOB or chest pain while sitting in bed. He was able to tolerate food and ambulating to bathroom. Consulting nephrology for dialysis, appreciate recs Consulting cardiology for elevated troponins in setting of worsened DOE and hx CAD, appreciate recs - monitor vitals per floor protocol - cardiac monitoring - heparin for DVT PPx - daily weights, strict I/O's - up with assistance - tyelnol prn pain - avoid zofran for nausea, as prolonged QTc of 540 on EKG  Chest Pain  CAD, Hx NSTEMI 11/2017 Follows with Dr. Angelena Form, last seen 02/12/18.  Patient not on BB due to hypotension with HD.  Recommended continuing ASA, Plavix, and statin.  Cath 11/25/2017 showed ostium RCA to mid RCA 100% stenosed, ostium circuflex to proximal circumflex 70% stenosed, was not candidate for PCI.  Trop on admission 0.1, down from 79s in September with NSTEMI.  Morning EKG showed no significant changes from previous. QTc improved to 504. troponins trended up since admission from 0.10>0.60>2.56. patient is denying current chest pain.  Consult cards, appreciate recs - continuous cardiac telemetry - cont home ASA, Plavix, Lipitor - continue carafate - cont to monitor for chest pain - avoid QTc  prolonging medications  Chronic Combined CHF Last Echo 12/19/2017, EF 65-70%, G1DD, moderate AS, which was improved from his previous in September. Patient cannot tolerate BB or ACE/ARB due to hypotension with HD.  BNP 362.8 on admission with CXR with b/l pulmonary edema.  S/P Lasix 46m IV in ED. Patient anuric. Weight yesterday 85kg, not recorded today- will continue to follow. - strict I/O's - daily weights - diet with fluid restriction  ESRD on HD MWF- Did not receive full treatment on Friday 1/10 due to neuropathy.  renal panel this morning showed Na 138, K 4.2, Cl 97, BUN 56, Cr 11.1, phos 5.4 - consult nephro for dialysis - cont home velphoro, rena-vit  Normocytic Anemia Likely 2/2 ESRD.  Hgb 8.4 today, baseline appears ~9.  Takes Vitamin B12 at home. - cont home Vit B12 - CBC am  Thrombocytopenia- chronic. 87 today - CBC am  Neuropathy: Chronic BLE, poorly controlled and worse with dialysis treatments. Takes gabapentin 3033mTID at home.  No sensation in b/l feet on exam. Denies negative side effects of medicine despite high dose on HD. - cont home gabapentin - discuss with nephrology on treatment dose/timing for improvement of symptoms  Chronic Back Pain Takes flexeril 45m56mIR PRN at home.  Also takes Oxy IR and Oxycontin prn, which he states is rarely. - cont home flexeril - hold home oxy IR and oxycontin  AL Amyloidosis Patient with negative BM bx for multiple myeloma.  Follows with Oncology, not currently on treatment.  Last seen in 09/2017. - no intervention needed at this time  HTN  BP well controlled without medications, as has hypotension with HD.  Nephrology managing with HD. SBP overnight 131-145. - nephro consult in AM, HD on MWF  HLD On Lipitor at home.  Last Lipid Panel 02/12/18.  Total cholesterol 117, LDL 50, Tgs 174, HDL 32. - cont home lipitor   FEN/GI: Renal with fluid restriciton Prophylaxis: Hep SubQ  Disposition: following for cardiac  ischemia workup and nephrology tx  Subjective:  Patient states he is doing better today. He has not been ambulating long distances as he didn't want to disturb others who are sleeping. He denies current chest pain or SOB. Able to ambulate in room and tolerate diet. Biggest complaint today is chronic neuropathy which is improved with gabapentin but is requesting a change to improve symptoms further as pain is very bad at dialysis. He has spoken with his kidney doctor about this in the past so will continue to discuss with them. He is concerned about his heart and I informed him of his elevated troponins and cardiac evaluation.  Objective: Temp:  [97.9 F (36.6 C)-98.7 F (37.1 C)] 97.9 F (36.6 C) (01/13 0549) Pulse Rate:  [80-98] 80 (01/13 0549) Resp:  [16-23] 18 (01/13 0549) BP: (131-160)/(62-75) 134/73 (01/13 0549) SpO2:  [94 %-100 %] 96 % (01/13 0549) Weight:  [84.7 kg-85 kg] 85 kg (01/13 0551) Physical Exam: General: sitting in bed, pleasant, cooperative Cardiovascular: RRR, no murmur appreciated, but lung sounds were loud Respiratory: CTAB, no wheezing or increased WOB Abdomen: soft, non-tender, non-distended Extremities: lower extremities trace edema bilaterally. No rashes. Right arm access normal.  Laboratory: Recent Labs  Lab 03/23/18 1620  WBC 6.0  HGB 8.9*  HCT 28.1*  PLT 97*   Recent Labs  Lab 03/23/18 1620 03/24/18 0214  NA 139 138  K 3.9 4.2  CL 96* 97*  CO2 27 28  BUN 49* 56*  CREATININE 10.20* 11.10*  CALCIUM 9.6 9.2  PROT 7.2  --   BILITOT 0.5  --   ALKPHOS 44  --   ALT 18  --   AST 22  --   GLUCOSE 108* 106*   Hepatitis B pending (ordered by nephro) troponins 0.1>0.6>2.56>3.84  Imaging/Diagnostic Tests: Dg Chest 2 View  Result Date: 03/23/2018 CLINICAL DATA:  Acute onset of shortness of breath 4 days ago, worse with exertion. Dialysis treatment 2 days ago was shortened due to neuropathic pain. Current history of CHF. EXAM: CHEST - 2 VIEW  COMPARISON:  11/27/2017 and earlier. FINDINGS: Cardiac silhouette mildly to moderately enlarged, unchanged. Thoracic aorta mildly atherosclerotic, unchanged. Hilar and mediastinal contours otherwise unremarkable. Minimal diffuse interstitial pulmonary edema as evidenced by Awanda Mink B lines. Lungs otherwise clear. No pleural effusions. No confluent airspace consolidation. Visualized bony thorax intact. IMPRESSION: Minimal acute CHF, with stable mild to moderate cardiomegaly and minimal diffuse interstitial pulmonary edema. Electronically Signed   By: Evangeline Dakin M.D.   On: 03/23/2018 17:26     Richarda Osmond, DO 03/24/2018, 7:22 AM PGY-1, [place] Intern pager: [phone], text pages welcome

## 2018-03-24 NOTE — Consult Note (Addendum)
Cardiology Consultation:   Patient ID: Jose Garrett MRN: [id number]; DOB: 08-24-1950  Admit date: 03/23/2018 Date of Consult: 03/24/2018  Primary Care Provider: [name], MD Primary Cardiologist: Jose Chandler, MD  Primary Electrophysiologist:  None    Patient Profile:   Jose Garrett is a 68 y.o. male with a hx of ESRD on HD (MWF), AL amyloid (kidney involvement), aortic stenosis, hypertension, HLD, GERD, anemia, chronic combined CHF and CAD who is being seen today for the evaluation of CHF and elevated troponin at the request of Jose Garrett.  History of Present Illness:   Jose Garrett has a past medical history as above.  He was first seen by Jose Garrett in September 2019 for acute respiratory failure in the setting of hypertensive urgency.  His troponin was elevated and EKG showed diffuse ST depression.  Cardiac cath 11/25/2017 showed occlusion of the RCA which was felt to be the culprit but the vessel could not be engaged.  The distal RCA filled with collaterals.  There was moderate disease in the ostial circumflex and mild disease in the LAD.  Medical therapy was recommended.  LVEF 35-40%.  There was moderate aortic stenosis.  Echo on 12/19/2017 showed EF 65-70% with moderate left ear, mean gradient 31 mmHg.  He is not on beta-blocker/ACE or ARB due to hypotension with dialysis.  Jose Garrett says that he has been having trouble tolerating his dialysis treatments lately due to aggravating his neuropathy.  On Friday he had an abbreviated treatment due to severe discomfort.  Over the weekend he was not very active and then yesterday (Sunday) he went shopping and noted dyspnea on exertion.  He could only walk short distances and then look for a chair to sit down.  With his shortness of breath he did note a bandlike chest tightness across his upper chest and throat. This was worse than his usual. He has chronic orthopnea which has not been worse lately.  He has mild pedal edema.   He is currently undergoing hemodialysis treatment and is having no shortness of breath at rest.   His biggest complaint is neuropathic pain in his feet.   Past Medical History:  Diagnosis Date  . Amyloidosis ([place])   . Anemia Dx 2016  . CAD (coronary artery disease)    a. cardiac cath on 11/25/17 with occlusion of the RCA which could have been his event but the RCA could not be engaged. The RCA filled distally from left to right collaterals. There were no severe stenoses in the left coronary system. Medical therapy recommended, started on plavix.  . Chronic combined systolic and diastolic CHF (congestive heart failure) ([place])    a. EF dropped to 35-40% with grade 2 DD by echo 11/2017.  [name] Diarrhea   . ESRD (end stage renal disease) ([place])   . Family history of adverse reaction to anesthesia    "my daughter can't take certain anesthesia agents" (09/08/2014)  . GERD (gastroesophageal reflux disease)   . History of blood transfusion 09/08/2014   "got hematoma after renal biopsy & HgB dropped"  . Hyperlipidemia Dx 2012  . Hypertension Dx 2012  . Restless legs     Past Surgical History:  Procedure Laterality Date  . AV FISTULA PLACEMENT Right 12/27/2015   Procedure: Right Arm ARTERIOVENOUS (AV) FISTULA CREATION;  Surgeon: Angelia Mould, MD;  Location: [facility];  Service: Vascular;  Laterality: Right;  . COLONOSCOPY    . IR GENERIC HISTORICAL  01/27/2016   IR  FLUORO GUIDE CV LINE RIGHT 01/27/2016 Jose Cleveland, MD [facility]  . IR GENERIC HISTORICAL  01/27/2016   IR US GUIDE VASC ACCESS RIGHT 01/27/2016 Jose Cleveland, MD [facility]  . LAPAROSCOPIC CHOLECYSTECTOMY  03/2011  . LEFT HEART CATH AND CORONARY ANGIOGRAPHY Garrett/A 11/25/2017   Procedure: LEFT HEART CATH AND CORONARY ANGIOGRAPHY;  Surgeon: Jose Garrett M, MD;  Location: [facility];  Service: Cardiovascular;  Laterality: Garrett/A;  . RENAL BIOPSY, PERCUTANEOUS Right 09/08/2014  . TONSILLECTOMY  ~ 1960     Home Medications:    Prior to Admission medications   Medication Sig Start Date End Date Taking? Authorizing Provider  acetaminophen (TYLENOL) 325 MG tablet Take 325-650 mg by mouth every 6 (six) hours as needed (back pain).   Yes [provider]  aspirin EC 81 MG tablet Take 1 tablet (81 mg total) by mouth daily. Patient taking differently: Take 81 mg by mouth See admin instructions. Take one tablet (81 mg) by mouth on Sunday, Tuesday, Thursday, Saturday mornings, take one tablet (81 mg) with supper on Monday, Wednesday, Friday if no bleeding that morning at dialysis 11/30/17  Yes Jose Bellis B, NP  atorvastatin (LIPITOR) 80 MG tablet Take 80 mg by mouth daily with supper.    Yes [provider]  Garrett Complex-C-Folic Acid (DIALYVITE 025) 0.8 MG TABS Take 0.8 mg by mouth daily.   Yes [provider]  clopidogrel (PLAVIX) 75 MG tablet Take 1 tablet (75 mg total) by mouth daily. 12/20/17  Yes Garrett, Jose N, PA-C  Cyanocobalamin 2500 MCG TABS Take 2,500 mcg by mouth daily.    Yes [provider]  cyclobenzaprine (FLEXERIL) 5 MG tablet Take every 8 hours as needed for muscle spasms Patient taking differently: Take 5 mg by mouth 2 (two) times daily.  01/07/18  Yes Jose Sprang, MD  ethyl chloride spray Apply 1 application topically See admin instructions. Spray on arm immediately prior to dialysis on Monday, Wednesday and Friday   Yes [provider]  gabapentin (NEURONTIN) 300 MG capsule TAKE 1 CAPSULE BY MOUTH EVERY MORNING AND 3 CAPSULES EACH NIGHT AT BEDTIME Patient taking differently: Take 300 mg by mouth 3 (three) times daily with meals.  01/07/18  Yes Jose Sprang, MD  LORazepam (ATIVAN) 1 MG tablet Take 1 tablet (1 mg total) by mouth daily as needed for anxiety (nausea). 01/08/17  Yes Jose Genera, MD  oxyCODONE (OXY IR/ROXICODONE) 5 MG immediate release tablet Take 1-2 tablets (5-10 mg total) by mouth every 6 (six) hours as needed for severe pain. Patient  taking differently: Take 2.5-5 mg by mouth at bedtime as needed (pain/leg spasms).  10/02/17  Yes Jose Genera, MD  oxyCODONE (OXYCONTIN) 10 mg 12 hr tablet Take 10 mg by mouth daily as needed (severe pain).    Yes [provider]  sucroferric oxyhydroxide (VELPHORO) 500 MG chewable tablet Chew 500-1,000 mg by mouth See admin instructions. Mix 2 tablets (1000 mg) in applesauce and take with full size meal, mix 1 - 1/2 tablets (500 - 750 mg) in applesauce for  light meals.   Yes [provider]  carvedilol (COREG) 3.125 MG tablet Take 1 tablet by mouth twice a day on NON DIALYSIS DAYS ONLY Patient not taking: Reported on 02/12/2018 12/26/17   Leanor Kail, PA    Inpatient Medications: Scheduled Meds: . aspirin EC  81 mg Oral Daily  . atorvastatin  80 mg Oral q1800  . Chlorhexidine Gluconate Cloth  6 each Topical Q0600  . clopidogrel  75 mg Oral Daily  . gabapentin  300 mg Oral TID  . heparin  5,000 Units Subcutaneous Q8H  . multivitamin  1 tablet Oral Daily  . sucralfate  1 g Oral TID WC & HS  . sucroferric oxyhydroxide  500-1,000 mg Oral TID WC  . vitamin Garrett-12  2,500 mcg Oral Daily   Continuous Infusions: . sodium chloride    . sodium chloride     PRN Meds: sodium chloride, sodium chloride, acetaminophen, alteplase, cyclobenzaprine, heparin, lidocaine (PF), lidocaine-prilocaine, pentafluoroprop-tetrafluoroeth  Allergies:    Allergies  Allergen Reactions  . No Known Allergies     Social History:   Social History   Socioeconomic History  . Marital status: Divorced    Spouse name: Not on file  . Number of children: Not on file  . Years of education: Not on file  . Highest education level: Not on file  Occupational History  . Not on file  Social Needs  . Financial resource strain: Not on file  . Food insecurity:    Worry: Not on file    Inability: Not on file  . Transportation needs:    Medical: Not on file    Non-medical: Not on file    Tobacco Use  . Smoking status: Former Smoker    Packs/day: 2.00    Years: 40.00    Pack years: 80.00    Types: Cigarettes    Last attempt to quit: 03/19/2011    Years since quitting: 7.0  . Smokeless tobacco: Never Used  Substance and Sexual Activity  . Alcohol use: No    Alcohol/week: 0.0 standard drinks  . Drug use: No    Comment: 09/08/2014 "I have used marijuana till the 1990's". Notes that he quit 15 yrs ago  . Sexual activity: Not Currently  Lifestyle  . Physical activity:    Days per week: Not on file    Minutes per session: Not on file  . Stress: Not on file  Relationships  . Social connections:    Talks on phone: Not on file    Gets together: Not on file    Attends religious service: Not on file    Active member of club or organization: Not on file    Attends meetings of clubs or organizations: Not on file    Relationship status: Not on file  . Intimate partner violence:    Fear of current or ex partner: Not on file    Emotionally abused: Not on file    Physically abused: Not on file    Forced sexual activity: Not on file  Other Topics Concern  . 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     Family History:    Family History  Problem Relation Age of Onset  . Hypertension Mother   . Diabetes Mother   . Heart disease Mother   . Skin cancer Mother   . Hypertension Father   . Diabetes Father   . Diabetes Sister      ROS:  Please see the history of present illness.   All other ROS reviewed and negative.     Physical Exam/Data:   Vitals:   03/24/18 0933 03/24/18 0938 03/24/18 1000 03/24/18 1030  BP: (!) 153/71  133/65 134/68  Pulse: 77  73 76 71  Resp: 19  10 17   Temp:      TempSrc:      SpO2:      Weight:      Height:        Intake/Output Summary (Last 24 hours) at 03/24/2018 1103 Last data filed at  03/24/2018 0914 Gross per 24 hour  Intake 865.73 ml  Output -  Net 865.73 ml   Last 3 Weights 03/24/2018 03/24/2018 03/23/2018  Weight (lbs) 188 lb 7.9 oz 187 lb 4.8 oz 186 lb 11.2 oz  Weight (kg) 85.5 kg 84.959 kg 84.687 kg     Body mass index is 27.05 kg/Garrett.  General:  Well nourished, well developed, in no acute distress HEENT: normal Neck: no JVD Endocrine:  No thryomegaly Vascular: No carotid bruits; FA pulses 2+ bilaterally without bruits  Cardiac:  normal S1, S2; RRR; harsh systolic murmur 3/6 in the left chest Lungs:  clear to auscultation bilaterally, no wheezing, rhonchi or rales  Abd: soft, nontender, no hepatomegaly  Ext: Trace pedal edema Musculoskeletal:  No deformities, BUE and BLE strength normal and equal Skin: warm and dry  Neuro:  CNs 2-12 intact, no focal abnormalities noted Psych:  Normal affect   EKG:  The EKG was personally reviewed and demonstrates: Normal sinus rhythm, 81 bpm, RBBB, diffuse T wave inversions and some ST depression, slightly more pronounced than prior Telemetry:  Telemetry was personally reviewed and demonstrates:  NSR in the 70's  Relevant CV Studies:  Echocardiogram 12/19/2017 Study Conclusions - Left ventricle: The cavity size was normal. There was moderate   concentric hypertrophy. Consider amyloid infiltration. Consider   PYP scan. Systolic function was vigorous. The estimated ejection   fraction was in the range of 65% to 70%. Wall motion was normal;   there were no regional wall motion abnormalities. Doppler   parameters are consistent with abnormal left ventricular   relaxation (grade 1 diastolic dysfunction). - Aortic valve: Functionally bicuspid; severely thickened, severely   calcified leaflets; fusion of the left-noncoronary commissure.   Valve mobility was restricted. There was moderate stenosis. There   was mild regurgitation. Peak velocity (S): 385 cm/s. Mean   gradient (S): 31 mm Hg. Valve area (VTI): 1.27 cm^2. Valve  area   (Vmax): 1.07 cm^2. Valve area (Vmean): 1.08 cm^2. - Mitral valve: Calcified annulus. - Right ventricle: The cavity size was normal. Wall thickness was   mildly increased. - Pericardium, extracardiac: A trivial pericardial effusion was   identified.  Impressions: - When compared to prior, EF has improved.  Echo 11/26/2016: EF 35-40% with anterior/anterolateral and inferolateral hypokinesis, grade 2 DD, moderate left ear  LEFT HEART CATH AND CORONARY ANGIOGRAPHY 11/25/17  Conclusion    Prox LAD lesion is 40% stenosed.  Ramus lesion is 40% stenosed.  Ost Cx to Prox Cx lesion is 70% stenosed.  Ost RCA to Mid RCA lesion is 100% stenosed.  LV end diastolic pressure is normal.  There is moderate aortic valve stenosis.   1. Single vessel occlusive CAD with occluded RCA. Unable to engage with a catheter.  2. Normal LVEDP 19 mm Hg 3. AV gradient of 25 mm Hg peak to peak c/w moderate Aortic stenosis.   Plan: will transfer to ICU. Ventilator management per ICU. Dialysis per Nephrology service. Will resume IV heparin 8 hours post sheath removal and continue to cycle cardiac enzymes. It is unclear at this time whether RCA occlusion is chronic or new. Given inability to engage vessel he is  not a candidate for PCI. No significant obstructive disease in the LCA.   Recommend Aspirin 81mg  daily for moderate CAD.     Laboratory Data:  Chemistry Recent Labs  Lab 03/23/18 1620 03/24/18 0214  NA 139 138  K 3.9 4.2  CL 96* 97*  CO2 27 28  GLUCOSE 108* 106*  BUN 49* 56*  CREATININE 10.20* 11.10*  CALCIUM 9.6 9.2  GFRNONAA 5* 4*  GFRAA 5* 5*  ANIONGAP 16* 13    Recent Labs  Lab 03/23/18 1620 03/24/18 0214  PROT 7.2  --   ALBUMIN 3.8 3.6  AST 22  --   ALT 18  --   ALKPHOS 44  --   BILITOT 0.5  --    Hematology Recent Labs  Lab 03/23/18 1620 03/24/18 0756  WBC 6.0 5.8  RBC 2.92* 2.73*  HGB 8.9* 8.4*  HCT 28.1* 26.3*  MCV 96.2 96.3  MCH 30.5 30.8  MCHC 31.7  31.9  RDW 14.1 14.4  PLT 97* 87*   Cardiac Enzymes Recent Labs  Lab 03/23/18 1620 03/23/18 2126 03/24/18 0214 03/24/18 0756  TROPONINI 0.10* 0.60* 2.56* 3.84*   No results for input(s): TROPIPOC in the last 168 hours.  BNP Recent Labs  Lab 03/23/18 1620  BNP 362.8*    DDimer No results for input(s): DDIMER in the last 168 hours.  Radiology/Studies:  Dg Chest 2 View  Result Date: 03/23/2018 CLINICAL DATA:  Acute onset of shortness of breath 4 days ago, worse with exertion. Dialysis treatment 2 days ago was shortened due to neuropathic pain. Current history of CHF. EXAM: CHEST - 2 VIEW COMPARISON:  11/27/2017 and earlier. FINDINGS: Cardiac silhouette mildly to moderately enlarged, unchanged. Thoracic aorta mildly atherosclerotic, unchanged. Hilar and mediastinal contours otherwise unremarkable. Minimal diffuse interstitial pulmonary edema as evidenced by Jose Garrett Garrett lines. Lungs otherwise clear. No pleural effusions. No confluent airspace consolidation. Visualized bony thorax intact. IMPRESSION: Minimal acute CHF, with stable mild to moderate cardiomegaly and minimal diffuse interstitial pulmonary edema. Electronically Signed   By: Evangeline Dakin Garrett.D.   On: 03/23/2018 17:26    Assessment and Plan:   Acute on chronic combined diastolic and systolic heart failure -Echo in 12/29/2017 showed EF 65-70% with grade 1 diastolic dysfunction -Fluid management per dialysis -Patient presented with shortness of breath on exertion in the setting of a suboptimal dialysis session on Friday. -Currently undergoing dialysis  Elevated troponin -Troponin and trended up from 0.1 to 3.84.  Troponin was up to 23 in September and cardiac cath at the time showed occluded RCA, unable to engage so PCI was not done. -Patient with mild bandlike chest tightness associated with his shortness of breath.  -EKG with RBBB and T wave inversions with ST depression which has been present in the past, now slightly more  pronounced. -Unclear if his elevated troponin is related to his renal failure and poor clearance with inadequate dialysis session on Friday or myocardial ischemia. -currently without chest pain -Would monitor his response after dialysis to see if his symptoms improve. -Will check 2 more troponins.  -If troponins do not rise significantly and he feels better tomorrow, will likely continue to treat medically. May be demand ischemia in setting of volume overload with known CAD.  -If troponins elevate significantly or pt has chest pain, may need to consider cardiac cath.   CAD -Found to have occluded RCA that fills distally with collaterals in 11/2017 with  moderate disease in the ostial circumflex and mild disease in the  LAD. -Medical therapy includes aspirin 81 mg, Plavix, high intensity statin, not on beta-blocker due to hypotension with dialysis  ESRD on HD -Followed by nephrology.  He is getting dialysis today. -AL amyloidosis with kidney involvement and nephrotic range proteinuria per renal biopsy in 09/2014.  Currently on surveillance, off maintenance Ixazomib due to concerns for neuropathy.  Hypertension -Blood pressure mildly elevated yesterday.  Most recent reading 134/68 -Nephrology manages hypertension.  Hyperlipidemia -On high intensity statin with atorvastatin 80 mg daily.  LDL was 50 on 02/12/2018, at goal of less than 70. -Continue current therapy  Aortic stenosis -Moderate AS by echo in October 2019. -Will check echo to evaluate for progression.  Carotid artery disease -Mild bilateral carotid artery disease by Dopplers in 12/2017  For questions or updates, please contact [place] Please consult [email or web address] for contact info under     Signed, Daune Perch, NP  03/24/2018 11:03 AM   I have personally seen and examined this patient with Daune Perch, NP.. I agree with the assessment and plan as outlined above. I know him from his admission in September 2019.  He was admitted then with dyspnea and acute respiratory failure. Troponin was 13. Urgent cardiac cath with occlusion of the ostial RCA which was likely his culprit vessel. The vessel could not be engaged. The RCA filled from left to right collaterals. The Circumflex had moderate ostial stenosis. The LAD had mild stenosis. His LV systolic function was reduced during admission but was normal on follow up echo in October 2019. He has moderate aortic stenosis. He also has ESRD on HD. His renal failure is due to amyloidosis.   He is now admitted with dyspnea and volume overload. He reports that his HD session was cut short on Friday due to his severe neuropathy pain in his feet and legs. His weight is up. He is having HD right now. Troponin has risen to 3.84. He has chronic EKG changes. I have personally reviewed the EKG today and it shows sinus rhythm with chronic lateral and anterolateral T wave inversions and ST depression, perhaps slightly more pronounced today. He notes one episode of chest pressure last week but has no chest pain or pressure this am.  Labs reviewed by me.   My exam:  General: Well developed, well nourished, NAD  HEENT: OP clear, mucus membranes moist  SKIN: warm, dry. No rashes. Neuro: No focal deficits  Musculoskeletal: Muscle strength 5/5 all ext  Psychiatric: Mood and affect normal  Neck: No JVD, no carotid bruits, no thyromegaly, no lymphadenopathy.  Lungs:Clear bilaterally, no wheezes, rhonci, crackles Cardiovascular: Regular rate and rhythm. No murmurs, gallops or rubs. Abdomen:Soft. Bowel sounds present. Non-tender.  Extremities: No lower extremity edema. Pulses are 2 + in the bilateral DP/PT.  Plan:  1. Known CAD/Elevated troponin: possible demand ischemia in setting of volume overload but cannot exclude ACS. He has advanced CAD by cath in September 2019 as noted above. He has no chest pain currently. Will follow troponin trend. If his trend is flat and he has no further  chest pain, will not plan cardiac cath. If he has recurrent chest pain or troponin becomes significantly elevated, will have to consider cath this week. If there is an upward trend in troponin, he will need IV heparin.   2. Moderate aortic stenosis: Repeat echo now given his presentation. The AS is moderately severe by echo in October 2019 (mean gradient 31 mmHg, AVA 1.07 cm2, dimensionless index 0.26).   3. Volume overload: HD  today. Dry weight around 180 lbs.   We will follow with you.   Jose Garrett 03/24/2018  12:23 PM

## 2018-03-24 NOTE — Progress Notes (Signed)
  Echocardiogram 2D Echocardiogram has been performed.  Jose Garrett 03/24/2018, 2:42 PM

## 2018-03-24 NOTE — Consult Note (Signed)
[facility] Renal Consultation Note    Indication for Consultation:  Management of ESRD/hemodialysis, anemia, hypertension/volume, and secondary hyperparathyroidism. PCP:  HPI: Jose Garrett is a 68 y.o. male with ESRD, HTN, amyloidosis, peripheral neuropathy, and CAD (s/p recent LHC with occluded RCA - unable to be stented) who was admitted with dyspnea.  Reports ongoing dyspnea with exertion and intermittent CP for the week prior to admission. Was getting dyspeic with minimal exertion (had a rough time shopping). Denied N/V, fever or chills. Has had issues with cramping at the end of dialysis and felt that HD did not improve his breathing status. Presented to ED on 1/12 - intake labs showed K 3.9, Mg 1.9, WBC 6, Hgb 8.9. CXR + pulmonary edema. EKG with ST depression in lateral leads. Initial trop negative, but trended up with trop this morning ^ to 3.84, Cardiology consulted, pending.  Today, denies CP currently. No dyspnea although has not been out of bed yet. No fever or chills. No new symptoms.  From renal standpoint, he dialyzes on MWF schedule at [facility]. Last HD was 1/10 which he did not fully complete due to cramping. Left 1kg up from dry weight (81kg). Was having prolonged bleeding from AVF 2 weeks ago, improved with reducing heparin dose.  Past Medical History:  Diagnosis Date  . Amyloidosis ([place])   . Anemia Dx 2016  . CAD (coronary artery disease)    a. cardiac cath on 11/25/17 with occlusion of the RCA which could have been his event but the RCA could not be engaged. The RCA filled distally from left to right collaterals. There were no severe stenoses in the left coronary system. Medical therapy recommended, started on plavix.  . Chronic combined systolic and diastolic CHF (congestive heart failure) ([place])    a. EF dropped to 35-40% with grade 2 DD by echo 11/2017.  [name] Diarrhea   . ESRD (end stage renal disease) ([place])   . Family history of adverse reaction to anesthesia     "my daughter can't take certain anesthesia agents" (09/08/2014)  . GERD (gastroesophageal reflux disease)   . History of blood transfusion 09/08/2014   "got hematoma after renal biopsy & HgB dropped"  . Hyperlipidemia Dx 2012  . Hypertension Dx 2012  . Restless legs    Past Surgical History:  Procedure Laterality Date  . AV FISTULA PLACEMENT Right 12/27/2015   Procedure: Right Arm ARTERIOVENOUS (AV) FISTULA CREATION;  Surgeon: Angelia Mould, MD;  Location: [place];  Service: Vascular;  Laterality: Right;  . COLONOSCOPY    . IR GENERIC HISTORICAL  01/27/2016   IR FLUORO GUIDE CV LINE RIGHT 01/27/2016 Arne Cleveland, MD [facility]  . IR GENERIC HISTORICAL  01/27/2016   IR US GUIDE VASC ACCESS RIGHT 01/27/2016 Arne Cleveland, MD [facility]  . LAPAROSCOPIC CHOLECYSTECTOMY  03/2011  . LEFT HEART CATH AND CORONARY ANGIOGRAPHY N/A 11/25/2017   Procedure: LEFT HEART CATH AND CORONARY ANGIOGRAPHY;  Surgeon: Martinique, Peter M, MD;  Location: [facility];  Service: Cardiovascular;  Laterality: N/A;  . RENAL BIOPSY, PERCUTANEOUS Right 09/08/2014  . TONSILLECTOMY  ~ 1960   Family History  Problem Relation Age of Onset  . Hypertension Mother   . Diabetes Mother   . Heart disease Mother   . Skin cancer Mother   . Hypertension Father   . Diabetes Father   . Diabetes Sister    Social History:  reports that he quit smoking about 7 years ago. His smoking use  included cigarettes. He has a 80.00 pack-year smoking history. He has never used smokeless tobacco. He reports that he does not drink alcohol or use drugs.  ROS: As per HPI otherwise negative.  Physical Exam: Vitals:   03/24/18 0933 03/24/18 0938 03/24/18 1000 03/24/18 1030  BP: (!) 153/71  133/65 134/68  Pulse: 77 73 76 71  Resp: 19  10 17   Temp:      TempSrc:      SpO2:      Weight:      Height:         General: Well developed, well nourished, in no acute distress. Head: Normocephalic, atraumatic, sclera  non-icteric, mucus membranes are moist. Neck: Supple without lymphadenopathy/masses. JVD not elevated. Lungs: Clear bilaterally to auscultation without wheezes, rales, or rhonchi. Breathing is unlabored. Heart: RRR with normal S1, S2. No murmurs, rubs, or gallops appreciated. Abdomen: Soft, non-tender, non-distended with normoactive bowel sounds. No rebound/guarding. No obvious abdominal masses. Musculoskeletal:  Strength and tone appear normal for age. Lower extremities: No edema or ischemic changes, no open wounds. Neuro: Alert and oriented X 3. Moves all extremities spontaneously. Psych:  Responds to questions appropriately with a normal affect. Dialysis Access: R AVF + thrill  Allergies  Allergen Reactions  . No Known Allergies    Prior to Admission medications   Medication Sig Start Date End Date Taking? Authorizing Provider  acetaminophen (TYLENOL) 325 MG tablet Take 325-650 mg by mouth every 6 (six) hours as needed (back pain).   Yes [provider]  aspirin EC 81 MG tablet Take 1 tablet (81 mg total) by mouth daily. Patient taking differently: Take 81 mg by mouth See admin instructions. Take one tablet (81 mg) by mouth on Sunday, Tuesday, Thursday, Saturday mornings, take one tablet (81 mg) with supper on Monday, Wednesday, Friday if no bleeding that morning at dialysis 11/30/17  Yes Reino Bellis B, NP  atorvastatin (LIPITOR) 80 MG tablet Take 80 mg by mouth daily with supper.    Yes [provider]  B Complex-C-Folic Acid (DIALYVITE 810) 0.8 MG TABS Take 0.8 mg by mouth daily.   Yes [provider]  clopidogrel (PLAVIX) 75 MG tablet Take 1 tablet (75 mg total) by mouth daily. 12/20/17  Yes Dunn, Dayna N, PA-C  Cyanocobalamin 2500 MCG TABS Take 2,500 mcg by mouth daily.    Yes [provider]  cyclobenzaprine (FLEXERIL) 5 MG tablet Take every 8 hours as needed for muscle spasms Patient taking differently: Take 5 mg by mouth 2 (two) times daily.   01/07/18  Yes Cameron Sprang, MD  ethyl chloride spray Apply 1 application topically See admin instructions. Spray on arm immediately prior to dialysis on Monday, Wednesday and Friday   Yes [provider]  gabapentin (NEURONTIN) 300 MG capsule TAKE 1 CAPSULE BY MOUTH EVERY MORNING AND 3 CAPSULES EACH NIGHT AT BEDTIME Patient taking differently: Take 300 mg by mouth 3 (three) times daily with meals.  01/07/18  Yes Cameron Sprang, MD  LORazepam (ATIVAN) 1 MG tablet Take 1 tablet (1 mg total) by mouth daily as needed for anxiety (nausea). 01/08/17  Yes [name], MD  oxyCODONE (OXY IR/ROXICODONE) 5 MG immediate release tablet Take 1-2 tablets (5-10 mg total) by mouth every 6 (six) hours as needed for severe pain. Patient taking differently: Take 2.5-5 mg by mouth at bedtime as needed (pain/leg spasms).  10/02/17  Yes [name], MD  oxyCODONE (OXYCONTIN) 10 mg 12 hr tablet Take  10 mg by mouth daily as needed (severe pain).    Yes [provider]  sucroferric oxyhydroxide (VELPHORO) 500 MG chewable tablet Chew 500-1,000 mg by mouth See admin instructions. Mix 2 tablets (1000 mg) in applesauce and take with full size meal, mix 1 - 1/2 tablets (500 - 750 mg) in applesauce for  light meals.   Yes [provider]  carvedilol (COREG) 3.125 MG tablet Take 1 tablet by mouth twice a day on NON DIALYSIS DAYS ONLY Patient not taking: Reported on 02/12/2018 12/26/17   Leanor Kail, PA   Current Facility-Administered Medications  Medication Dose Route Frequency Provider Last Rate Last Dose  . 0.9 %  sodium chloride infusion  100 mL Intravenous PRN Loren Racer, PA-C      . 0.9 %  sodium chloride infusion  100 mL Intravenous PRN Loren Racer, PA-C      . acetaminophen (TYLENOL) tablet 650 mg  650 mg Oral Q4H PRN Cleophas Dunker, DO   650 mg at 03/23/18 2124  . alteplase (CATHFLO ACTIVASE) injection 2 mg  2 mg Intracatheter Once PRN Loren Racer, PA-C      . aspirin EC tablet 81 mg  81 mg Oral Daily Meccariello, Bernita Raisin, DO   81 mg at 03/24/18 0905  . atorvastatin (LIPITOR) tablet 80 mg  80 mg Oral q1800 MeccarielloBernita Raisin, DO   80 mg at 03/23/18 2114  . Chlorhexidine Gluconate Cloth 2 % PADS 6 each  6 each Topical Q0600 Loren Racer, PA-C      . clopidogrel (PLAVIX) tablet 75 mg  75 mg Oral Daily Meccariello, Bernita Raisin, DO   75 mg at 03/24/18 0905  . cyclobenzaprine (FLEXERIL) tablet 5 mg  5 mg Oral BID PRN Meccariello, Bernita Raisin, DO      . gabapentin (NEURONTIN) capsule 300 mg  300 mg Oral TID Cleophas Dunker, DO   300 mg at 03/24/18 0905  . heparin injection 1,000 Units  1,000 Units Dialysis PRN Loren Racer, PA-C      . heparin injection 5,000 Units  5,000 Units Subcutaneous Q8H Meccariello, Bernita Raisin, DO   5,000 Units at 03/24/18 7035  . lidocaine (PF) (XYLOCAINE) 1 % injection 5 mL  5 mL Intradermal PRN Loren Racer, PA-C      . lidocaine-prilocaine (EMLA) cream 1 application  1 application Topical PRN Loren Racer, PA-C      . multivitamin (RENA-VIT) tablet 1 tablet  1 tablet Oral Daily Meccariello, Bernita Raisin, DO   1 tablet at 03/24/18 0904  . pentafluoroprop-tetrafluoroeth (GEBAUERS) aerosol 1 application  1 application Topical PRN Loren Racer, PA-C      . sucralfate (CARAFATE) 1 GM/10ML suspension 1 g  1 g Oral TID WC & HS Meccariello, Bernita Raisin, DO   1 g at 03/24/18 0093  . sucroferric oxyhydroxide (VELPHORO) chewable tablet 500-1,000 mg  500-1,000 mg Oral TID WC Meccariello, Bailey J, DO   500 mg at 03/24/18 0903  . vitamin B-12 (CYANOCOBALAMIN) tablet 2,500 mcg  2,500 mcg Oral Daily Meccariello, Bernita Raisin, DO   2,500 mcg at 03/24/18 8182   Labs: Basic Metabolic Panel: Recent Labs  Lab 03/23/18 1620 03/24/18 0214  NA 139 138  K 3.9 4.2  CL 96* 97*  CO2 27 28  GLUCOSE 108* 106*  BUN 49* 56*  CREATININE 10.20* 11.10*  CALCIUM 9.6 9.2  PHOS  --  5.4*   Liver Function  Tests:  Recent Labs  Lab 03/23/18 1620 03/24/18 0214  AST 22  --   ALT 18  --   ALKPHOS 44  --   BILITOT 0.5  --   PROT 7.2  --   ALBUMIN 3.8 3.6   CBC: Recent Labs  Lab 03/23/18 1620 03/24/18 0756  WBC 6.0 5.8  NEUTROABS 3.9  --   HGB 8.9* 8.4*  HCT 28.1* 26.3*  MCV 96.2 96.3  PLT 97* 87*   Cardiac Enzymes: Recent Labs  Lab 03/23/18 1620 03/23/18 2126 03/24/18 0214 03/24/18 0756  TROPONINI 0.10* 0.60* 2.56* 3.84*   Studies/Results: Dg Chest 2 View  Result Date: 03/23/2018 CLINICAL DATA:  Acute onset of shortness of breath 4 days ago, worse with exertion. Dialysis treatment 2 days ago was shortened due to neuropathic pain. Current history of CHF. EXAM: CHEST - 2 VIEW COMPARISON:  11/27/2017 and earlier. FINDINGS: Cardiac silhouette mildly to moderately enlarged, unchanged. Thoracic aorta mildly atherosclerotic, unchanged. Hilar and mediastinal contours otherwise unremarkable. Minimal diffuse interstitial pulmonary edema as evidenced by Awanda Mink B lines. Lungs otherwise clear. No pleural effusions. No confluent airspace consolidation. Visualized bony thorax intact. IMPRESSION: Minimal acute CHF, with stable mild to moderate cardiomegaly and minimal diffuse interstitial pulmonary edema. Electronically Signed   By: Evangeline Dakin M.D.   On: 03/23/2018 17:26   Dialysis Orders:  MWF at [facility] 4hr, 400/800, EDW 81kg, 2K/2Ca, UF#2, AVF, heparin 1500 bolus - Mircera 63mcg IV q 2 weeks (last 1/8)  Assessment/Plan: 1.  Dyspnea: Exertional for most part - CXR with pulm edema, may be his angina equivalent as well. Due for HD today - will try to get fluid off to see if improves his symptoms. 2.  ESRD: Usual MWF schedule, due for HD today. 3L UF goal. Will remove the UF profile to see if helps with his cramping. 3.  Hypertension/volume: BP coming down with dialysis, follow. 4.  Anemia: Hgb 8.4 - just dosed with ESA. Follow. 5.  Metabolic bone disease: Ca/Phos ok. Continue home binder  (Velphoro) 6.  CAD (known occluded RCA on last cath): Per cardiology. On plavix/aspirin.  Veneta Penton, PA-C 03/24/2018, 11:25 AM  [facility] Pager: [phone]

## 2018-03-24 NOTE — Progress Notes (Signed)
ANTICOAGULATION CONSULT NOTE - Initial Consult  Pharmacy Consult for Heparin Indication: chest pain/ACS  Allergies  Allergen Reactions  . No Known Allergies     Patient Measurements: Height: 5\' 10"  (177.8 cm) Weight: 181 lb 14.1 oz (82.5 kg) IBW/kg (Calculated) : 73 Heparin Dosing Weight: TBW  Vital Signs: Temp: 98.9 F (37.2 C) (01/13 1939) Temp Source: Oral (01/13 1939) BP: 108/57 (01/13 1939) Pulse Rate: 86 (01/13 1939)  Labs: Recent Labs    03/23/18 1620  03/24/18 0214 03/24/18 0756 03/24/18 1343 03/24/18 1921  HGB 8.9*  --   --  8.4*  --   --   HCT 28.1*  --   --  26.3*  --   --   PLT 97*  --   --  87*  --   --   CREATININE 10.20*  --  11.10*  --   --   --   TROPONINI 0.10*   < > 2.56* 3.84* 5.13* 5.93*   < > = values in this interval not displayed.    Estimated Creatinine Clearance: 6.9 mL/min (A) (by C-G formula based on SCr of 11.1 mg/dL (H)).   Medical History: Past Medical History:  Diagnosis Date  . Amyloidosis ([place])   . Anemia Dx 2016  . CAD (coronary artery disease)    a. cardiac cath on 11/25/17 with occlusion of the RCA which could have been his event but the RCA could not be engaged. The RCA filled distally from left to right collaterals. There were no severe stenoses in the left coronary system. Medical therapy recommended, started on plavix.  . Chronic combined systolic and diastolic CHF (congestive heart failure) ([place])    a. EF dropped to 35-40% with grade 2 DD by echo 11/2017.  [name] Diarrhea   . ESRD (end stage renal disease) ([place])   . Family history of adverse reaction to anesthesia    "my daughter can't take certain anesthesia agents" (09/08/2014)  . GERD (gastroesophageal reflux disease)   . History of blood transfusion 09/08/2014   "got hematoma after renal biopsy & HgB dropped"  . Hyperlipidemia Dx 2012  . Hypertension Dx 2012  . Restless legs     Medications:  Medications Prior to Admission  Medication Sig Dispense Refill Last Dose   . acetaminophen (TYLENOL) 325 MG tablet Take 325-650 mg by mouth every 6 (six) hours as needed (back pain).   week ago  . aspirin EC 81 MG tablet Take 1 tablet (81 mg total) by mouth daily. (Patient taking differently: Take 81 mg by mouth See admin instructions. Take one tablet (81 mg) by mouth on Sunday, Tuesday, Thursday, Saturday mornings, take one tablet (81 mg) with supper on Monday, Wednesday, Friday if no bleeding that morning at dialysis) 30 tablet 11 maybe 1/12 at am  . atorvastatin (LIPITOR) 80 MG tablet Take 80 mg by mouth daily with supper.    03/22/2018 at pm  . B Complex-C-Folic Acid (DIALYVITE 423) 0.8 MG TABS Take 0.8 mg by mouth daily.   03/23/2018 at am  . clopidogrel (PLAVIX) 75 MG tablet Take 1 tablet (75 mg total) by mouth daily. 90 tablet 3 03/23/2018 at am  . Cyanocobalamin 2500 MCG TABS Take 2,500 mcg by mouth daily.    03/23/2018 at am  . cyclobenzaprine (FLEXERIL) 5 MG tablet Take every 8 hours as needed for muscle spasms (Patient taking differently: Take 5 mg by mouth 2 (two) times daily. ) 30 tablet 11 03/23/2018 at am  . ethyl chloride  spray Apply 1 application topically See admin instructions. Spray on arm immediately prior to dialysis on Monday, Wednesday and Friday   03/21/2018  . gabapentin (NEURONTIN) 300 MG capsule TAKE 1 CAPSULE BY MOUTH EVERY MORNING AND 3 CAPSULES EACH NIGHT AT BEDTIME (Patient taking differently: Take 300 mg by mouth 3 (three) times daily with meals. ) 120 capsule 11 03/23/2018 at am  . LORazepam (ATIVAN) 1 MG tablet Take 1 tablet (1 mg total) by mouth daily as needed for anxiety (nausea). 30 tablet 0 week ago  . oxyCODONE (OXY IR/ROXICODONE) 5 MG immediate release tablet Take 1-2 tablets (5-10 mg total) by mouth every 6 (six) hours as needed for severe pain. (Patient taking differently: Take 2.5-5 mg by mouth at bedtime as needed (pain/leg spasms). ) 60 tablet 0 03/22/2018 at pm  . oxyCODONE (OXYCONTIN) 10 mg 12 hr tablet Take 10 mg by mouth daily as  needed (severe pain).    several weeks ago  . sucroferric oxyhydroxide (VELPHORO) 500 MG chewable tablet Chew 500-1,000 mg by mouth See admin instructions. Mix 2 tablets (1000 mg) in applesauce and take with full size meal, mix 1 - 1/2 tablets (500 - 750 mg) in applesauce for  light meals.   03/23/2018 at supper  . carvedilol (COREG) 3.125 MG tablet Take 1 tablet by mouth twice a day on NON DIALYSIS DAYS ONLY (Patient not taking: Reported on 02/12/2018) 180 tablet 3 Not Taking at Unknown time    Assessment: 68 y.o. M presents with elevated trop (up to 5.93). To begin heparin per pharmacy for ACS. May need cardiac cath per cardiology if trop continues to rise. Hgb 8.4 (anemia of ESRD, usual Hgb 8-9s), plt 87 (~120 3 mos ago).  Goal of Therapy:  Heparin level 0.3-0.7 units/ml Monitor platelets by anticoagulation protocol: Yes   Plan:  Heparin IV bolus 4000 units Heparin gtt at 1150 units/hr Will f/u heparin level in 8 hours - F/u plt closely Daily heparin level and CBC  Sherlon Handing, PharmD, BCPS Clinical pharmacist  **Pharmacist phone directory can now be found on [email or web address] ([email or web address]).  Listed under [place]. 03/24/2018,10:24 PM

## 2018-03-25 ENCOUNTER — Encounter (HOSPITAL_COMMUNITY)
Admission: EM | Disposition: A | Discharge status: Home / Self Care | Payer: Self-pay | Source: Home / Self Care | Attending: Family Medicine

## 2018-03-25 ENCOUNTER — Encounter (HOSPITAL_COMMUNITY): Payer: Self-pay | Admitting: General Practice

## 2018-03-25 ENCOUNTER — Ambulatory Visit (HOSPITAL_COMMUNITY): Admit: 2018-03-25 | Payer: Medicare Other | Admitting: Cardiology

## 2018-03-25 ENCOUNTER — Other Ambulatory Visit: Payer: Self-pay

## 2018-03-25 DIAGNOSIS — R0602 Shortness of breath: Secondary | ICD-10-CM | POA: Diagnosis present

## 2018-03-25 DIAGNOSIS — G2581 Restless legs syndrome: Secondary | ICD-10-CM | POA: Diagnosis present

## 2018-03-25 DIAGNOSIS — R0600 Dyspnea, unspecified: Secondary | ICD-10-CM | POA: Diagnosis not present

## 2018-03-25 DIAGNOSIS — D631 Anemia in chronic kidney disease: Secondary | ICD-10-CM | POA: Diagnosis not present

## 2018-03-25 DIAGNOSIS — E538 Deficiency of other specified B group vitamins: Secondary | ICD-10-CM | POA: Diagnosis present

## 2018-03-25 DIAGNOSIS — I452 Bifascicular block: Secondary | ICD-10-CM | POA: Diagnosis not present

## 2018-03-25 DIAGNOSIS — E785 Hyperlipidemia, unspecified: Secondary | ICD-10-CM | POA: Diagnosis present

## 2018-03-25 DIAGNOSIS — I259 Chronic ischemic heart disease, unspecified: Secondary | ICD-10-CM

## 2018-03-25 DIAGNOSIS — I214 Non-ST elevation (NSTEMI) myocardial infarction: Secondary | ICD-10-CM | POA: Diagnosis not present

## 2018-03-25 DIAGNOSIS — R072 Precordial pain: Secondary | ICD-10-CM | POA: Diagnosis not present

## 2018-03-25 DIAGNOSIS — G8929 Other chronic pain: Secondary | ICD-10-CM | POA: Diagnosis present

## 2018-03-25 DIAGNOSIS — R011 Cardiac murmur, unspecified: Secondary | ICD-10-CM | POA: Diagnosis present

## 2018-03-25 DIAGNOSIS — F419 Anxiety disorder, unspecified: Secondary | ICD-10-CM | POA: Diagnosis present

## 2018-03-25 DIAGNOSIS — E8889 Other specified metabolic disorders: Secondary | ICD-10-CM | POA: Diagnosis not present

## 2018-03-25 DIAGNOSIS — N186 End stage renal disease: Secondary | ICD-10-CM | POA: Diagnosis not present

## 2018-03-25 DIAGNOSIS — Z992 Dependence on renal dialysis: Secondary | ICD-10-CM | POA: Diagnosis not present

## 2018-03-25 DIAGNOSIS — I120 Hypertensive chronic kidney disease with stage 5 chronic kidney disease or end stage renal disease: Secondary | ICD-10-CM | POA: Diagnosis not present

## 2018-03-25 DIAGNOSIS — R7989 Other specified abnormal findings of blood chemistry: Secondary | ICD-10-CM | POA: Diagnosis not present

## 2018-03-25 DIAGNOSIS — E859 Amyloidosis, unspecified: Secondary | ICD-10-CM | POA: Diagnosis not present

## 2018-03-25 DIAGNOSIS — I5042 Chronic combined systolic (congestive) and diastolic (congestive) heart failure: Secondary | ICD-10-CM | POA: Diagnosis not present

## 2018-03-25 DIAGNOSIS — N2581 Secondary hyperparathyroidism of renal origin: Secondary | ICD-10-CM | POA: Diagnosis not present

## 2018-03-25 DIAGNOSIS — D696 Thrombocytopenia, unspecified: Secondary | ICD-10-CM | POA: Diagnosis not present

## 2018-03-25 DIAGNOSIS — I350 Nonrheumatic aortic (valve) stenosis: Secondary | ICD-10-CM | POA: Diagnosis not present

## 2018-03-25 DIAGNOSIS — G629 Polyneuropathy, unspecified: Secondary | ICD-10-CM | POA: Diagnosis present

## 2018-03-25 DIAGNOSIS — K219 Gastro-esophageal reflux disease without esophagitis: Secondary | ICD-10-CM | POA: Diagnosis present

## 2018-03-25 DIAGNOSIS — I132 Hypertensive heart and chronic kidney disease with heart failure and with stage 5 chronic kidney disease, or end stage renal disease: Secondary | ICD-10-CM | POA: Diagnosis not present

## 2018-03-25 DIAGNOSIS — I25118 Atherosclerotic heart disease of native coronary artery with other forms of angina pectoris: Secondary | ICD-10-CM | POA: Diagnosis not present

## 2018-03-25 DIAGNOSIS — E559 Vitamin D deficiency, unspecified: Secondary | ICD-10-CM | POA: Diagnosis present

## 2018-03-25 DIAGNOSIS — M549 Dorsalgia, unspecified: Secondary | ICD-10-CM | POA: Diagnosis present

## 2018-03-25 DIAGNOSIS — I252 Old myocardial infarction: Secondary | ICD-10-CM | POA: Diagnosis not present

## 2018-03-25 HISTORY — PX: ULTRASOUND GUIDANCE FOR VASCULAR ACCESS: SHX6516

## 2018-03-25 HISTORY — PX: RIGHT/LEFT HEART CATH AND CORONARY ANGIOGRAPHY: CATH118266

## 2018-03-25 LAB — POCT I-STAT 3, VENOUS BLOOD GAS (G3P V)
Acid-Base Excess: 5 mmol/L — ABNORMAL HIGH (ref 0.0–2.0)
BICARBONATE: 30.4 mmol/L — AB (ref 20.0–28.0)
O2 Saturation: 70 %
TCO2: 32 mmol/L (ref 22–32)
pCO2, Ven: 46.3 mmHg (ref 44.0–60.0)
pH, Ven: 7.425 (ref 7.250–7.430)
pO2, Ven: 36 mmHg (ref 32.0–45.0)

## 2018-03-25 LAB — CBC
HCT: 26.9 % — ABNORMAL LOW (ref 39.0–52.0)
Hemoglobin: 8.6 g/dL — ABNORMAL LOW (ref 13.0–17.0)
MCH: 30.9 pg (ref 26.0–34.0)
MCHC: 32 g/dL (ref 30.0–36.0)
MCV: 96.8 fL (ref 80.0–100.0)
Platelets: 77 10*3/uL — ABNORMAL LOW (ref 150–400)
RBC: 2.78 MIL/uL — ABNORMAL LOW (ref 4.22–5.81)
RDW: 14.5 % (ref 11.5–15.5)
WBC: 6.4 10*3/uL (ref 4.0–10.5)
nRBC: 0 % (ref 0.0–0.2)

## 2018-03-25 LAB — TROPONIN I
Troponin I: 5.02 ng/mL (ref <0.03)
Troponin I: 4.51 ng/mL (ref <0.03)
Troponin I: 4.39 ng/mL (ref <0.03)
Troponin I: 3.79 ng/mL (ref <0.03)

## 2018-03-25 LAB — HEPARIN LEVEL (UNFRACTIONATED): Heparin Unfractionated: 0.67 IU/mL (ref 0.30–0.70)

## 2018-03-25 LAB — IMMATURE PLATELET FRACTION: Immature Platelet Fraction: 2.4 % (ref 1.2–8.6)

## 2018-03-25 LAB — HEPATITIS B SURFACE ANTIGEN: Hepatitis B Surface Ag: NEGATIVE

## 2018-03-25 SURGERY — ULTRASOUND GUIDANCE, FOR VASCULAR ACCESS

## 2018-03-25 MED ORDER — CHLORHEXIDINE GLUCONATE CLOTH 2 % EX PADS: 6.0000 | MEDICATED_PAD | Freq: Every day | CUTANEOUS | Status: DC

## 2018-03-25 MED ORDER — SODIUM CHLORIDE 0.9 % IV SOLN: INTRAVENOUS | Status: DC

## 2018-03-25 MED ORDER — LIDOCAINE HCL (PF) 1 % IJ SOLN
INTRAMUSCULAR | Status: AC
  Filled 2018-03-25: qty 30

## 2018-03-25 MED ORDER — LIDOCAINE HCL (PF) 1 % IJ SOLN
INTRAMUSCULAR | Status: DC | PRN
  Administered 2018-03-25: 15 mL

## 2018-03-25 MED ORDER — SODIUM CHLORIDE 0.9% FLUSH: 3.0000 mL | INTRAVENOUS | Status: DC | PRN

## 2018-03-25 MED ORDER — HEPARIN (PORCINE) IN NACL 1000-0.9 UT/500ML-% IV SOLN
INTRAVENOUS | Status: DC | PRN
  Administered 2018-03-25 (×2): 500 mL

## 2018-03-25 MED ORDER — HEPARIN (PORCINE) IN NACL 1000-0.9 UT/500ML-% IV SOLN
INTRAVENOUS | Status: AC
  Filled 2018-03-25: qty 500

## 2018-03-25 MED ORDER — SODIUM CHLORIDE 0.9 % IV SOLN: 250.0000 mL | INTRAVENOUS | Status: DC | PRN

## 2018-03-25 MED ORDER — RENA-VITE PO TABS: 1.0000 | ORAL_TABLET | Freq: Every day | ORAL | Status: DC

## 2018-03-25 MED ORDER — FENTANYL CITRATE (PF) 100 MCG/2ML IJ SOLN
INTRAMUSCULAR | Status: DC | PRN
  Administered 2018-03-25: 25 ug via INTRAVENOUS

## 2018-03-25 MED ORDER — MIDAZOLAM HCL 2 MG/2ML IJ SOLN
INTRAMUSCULAR | Status: AC
  Filled 2018-03-25: qty 2

## 2018-03-25 MED ORDER — SODIUM CHLORIDE 0.9% FLUSH
3.0000 mL | Freq: Two times a day (BID) | INTRAVENOUS | Status: DC
  Administered 2018-03-25: 3 mL via INTRAVENOUS

## 2018-03-25 MED ORDER — ONDANSETRON HCL 4 MG/2ML IJ SOLN: 4.0000 mg | Freq: Four times a day (QID) | INTRAMUSCULAR | Status: DC | PRN

## 2018-03-25 MED ORDER — MIDAZOLAM HCL 2 MG/2ML IJ SOLN
INTRAMUSCULAR | Status: DC | PRN
  Administered 2018-03-25: 1 mg via INTRAVENOUS

## 2018-03-25 MED ORDER — FENTANYL CITRATE (PF) 100 MCG/2ML IJ SOLN
INTRAMUSCULAR | Status: AC
  Filled 2018-03-25: qty 2

## 2018-03-25 MED ORDER — HEPARIN (PORCINE) 25000 UT/250ML-% IV SOLN
1200.0000 [IU]/h | INTRAVENOUS | Status: DC
  Administered 2018-03-25: 1100 [IU]/h via INTRAVENOUS
  Filled 2018-03-25: qty 250

## 2018-03-25 SURGICAL SUPPLY — 10 items
CATH INFINITI 5FR MULTPACK ANG (CATHETERS) ×2 IMPLANT
CATH SWAN GANZ 7F STRAIGHT (CATHETERS) ×2 IMPLANT
KIT HEART LEFT (KITS) ×4 IMPLANT
PACK CARDIAC CATHETERIZATION (CUSTOM PROCEDURE TRAY) ×4 IMPLANT
SHEATH PINNACLE 5F 10CM (SHEATH) ×2 IMPLANT
SHEATH PINNACLE 7F 10CM (SHEATH) ×2 IMPLANT
SHEATH PROBE COVER 6X72 (BAG) ×2 IMPLANT
TRANSDUCER W/STOPCOCK (MISCELLANEOUS) ×4 IMPLANT
WIRE EMERALD 3MM-J .035X150CM (WIRE) ×2 IMPLANT
WIRE EMERALD ST .035X150CM (WIRE) ×2 IMPLANT

## 2018-03-25 NOTE — Progress Notes (Signed)
Removal of Right femoral arterial and venous sheath without complication. Total hold time was 30 minutes by Rachael Fee, RT-R. Distal pulses present by Doppler only (DP). Sterile tegaderm applied to the puncture site. Dressing dry and intact upon departure form Cath Lab suite. BP; 143/72, HR: 88, SP02 98. Pateint given recovery instruction and was able to repeat and understand recovery process.

## 2018-03-25 NOTE — Progress Notes (Addendum)
Progress Note  Patient Name: Jose Garrett Date of Encounter: 03/25/2018  Primary Cardiologist: Lauree Chandler, MD   Subjective   Has been having some worsening DOE recently with minimal activity, also had band like sensation across the chest pain. Chest discomfort improved. He did mention his dialysis prior to arrival was cut short a hour early.   Inpatient Medications    Scheduled Meds: . aspirin EC  81 mg Oral Daily  . atorvastatin  80 mg Oral q1800  . Chlorhexidine Gluconate Cloth  6 each Topical Q0600  . clopidogrel  75 mg Oral Daily  . gabapentin  300 mg Oral TID  . multivitamin  1 tablet Oral Daily  . sucralfate  1 g Oral TID WC & HS  . sucroferric oxyhydroxide  500-1,000 mg Oral TID WC  . vitamin B-12  2,500 mcg Oral Daily   Continuous Infusions: . heparin 1,150 Units/hr (03/25/18 0040)   PRN Meds: acetaminophen, cyclobenzaprine   Vital Signs    Vitals:   03/24/18 1643 03/24/18 1939 03/24/18 2359 03/25/18 0402  BP: (!) 125/57 (!) 108/57 121/60 137/67  Pulse: 83 86 87 86  Resp: 18 18 18 18   Temp: (!) 97.5 F (36.4 C) 98.9 F (37.2 C) 98.6 F (37 C) 98 F (36.7 C)  TempSrc: Oral Oral Oral Oral  SpO2: (!) 82% 98% 98% 97%  Weight:    83 kg  Height:        Intake/Output Summary (Last 24 hours) at 03/25/2018 0954 Last data filed at 03/25/2018 0300 Gross per 24 hour  Intake 306.25 ml  Output 3000 ml  Net -2693.75 ml   Last 3 Weights 03/25/2018 03/24/2018 03/24/2018  Weight (lbs) 183 lb 181 lb 14.1 oz 188 lb 7.9 oz  Weight (kg) 83.008 kg 82.5 kg 85.5 kg      Telemetry    NSR - Personally Reviewed  ECG    NSR with worsening ST depression in inferolateral leads- Personally Reviewed  Physical Exam   GEN: No acute distress.   Neck: No JVD Cardiac: RRR, 3/6 harsh systolic murmur at the RUSB, distant heart sounds, no diastolic murmur Respiratory: Clear to auscultation bilaterally. GI: Soft, nontender, non-distended  MS: trace bilateral  pretibial edema; No deformity. Neuro:  Nonfocal  Psych: Normal affect   Labs    Chemistry Recent Labs  Lab 03/23/18 1620 03/24/18 0214  NA 139 138  K 3.9 4.2  CL 96* 97*  CO2 27 28  GLUCOSE 108* 106*  BUN 49* 56*  CREATININE 10.20* 11.10*  CALCIUM 9.6 9.2  PROT 7.2  --   ALBUMIN 3.8 3.6  AST 22  --   ALT 18  --   ALKPHOS 44  --   BILITOT 0.5  --   GFRNONAA 5* 4*  GFRAA 5* 5*  ANIONGAP 16* 13     Hematology Recent Labs  Lab 03/23/18 1620 03/24/18 0756 03/25/18 0715  WBC 6.0 5.8 6.4  RBC 2.92* 2.73* 2.78*  HGB 8.9* 8.4* 8.6*  HCT 28.1* 26.3* 26.9*  MCV 96.2 96.3 96.8  MCH 30.5 30.8 30.9  MCHC 31.7 31.9 32.0  RDW 14.1 14.4 14.5  PLT 97* 87* 77*    Cardiac Enzymes Recent Labs  Lab 03/24/18 1343 03/24/18 1921 03/25/18 0035 03/25/18 0715  TROPONINI 5.13* 5.93* 5.02* 4.51*   No results for input(s): TROPIPOC in the last 168 hours.   BNP Recent Labs  Lab 03/23/18 1620  BNP 362.8*     DDimer No results  for input(s): DDIMER in the last 168 hours.   Radiology    Dg Chest 2 View  Result Date: 03/23/2018 CLINICAL DATA:  Acute onset of shortness of breath 4 days ago, worse with exertion. Dialysis treatment 2 days ago was shortened due to neuropathic pain. Current history of CHF. EXAM: CHEST - 2 VIEW COMPARISON:  11/27/2017 and earlier. FINDINGS: Cardiac silhouette mildly to moderately enlarged, unchanged. Thoracic aorta mildly atherosclerotic, unchanged. Hilar and mediastinal contours otherwise unremarkable. Minimal diffuse interstitial pulmonary edema as evidenced by Awanda Mink B lines. Lungs otherwise clear. No pleural effusions. No confluent airspace consolidation. Visualized bony thorax intact. IMPRESSION: Minimal acute CHF, with stable mild to moderate cardiomegaly and minimal diffuse interstitial pulmonary edema. Electronically Signed   By: Evangeline Dakin M.D.   On: 03/23/2018 17:26    Cardiac Studies   Cath 11/25/2017  Prox LAD lesion is 40%  stenosed.  Ramus lesion is 40% stenosed.  Ost Cx to Prox Cx lesion is 70% stenosed.  Ost RCA to Mid RCA lesion is 100% stenosed.  LV end diastolic pressure is normal.  There is moderate aortic valve stenosis.   1. Single vessel occlusive CAD with occluded RCA. Unable to engage with a catheter.  2. Normal LVEDP 19 mm Hg 3. AV gradient of 25 mm Hg peak to peak c/w moderate Aortic stenosis.   Plan: will transfer to ICU. Ventilator management per ICU. Dialysis per Nephrology service. Will resume IV heparin 8 hours post sheath removal and continue to cycle cardiac enzymes. It is unclear at this time whether RCA occlusion is chronic or new. Given inability to engage vessel he is not a candidate for PCI. No significant obstructive disease in the LCA.   Recommend Aspirin 81mg  daily for moderate CAD.    Echo 03/24/2018 LV EF: 40% -   45% Study Conclusions  - Left ventricle: The cavity size was normal. Wall thickness was   increased in a pattern of moderate LVH. Systolic function was   mildly to moderately reduced. The estimated ejection fraction was   in the range of 40% to 45%. Diffuse hypokinesis. Doppler   parameters are consistent with abnormal left ventricular   relaxation (grade 1 diastolic dysfunction). The E/e&' ratio is   >15, suggesting elevated LV filling pressure. - Aortic valve: Calcified with moderate stenosis. Mean gradient   (S): 30 mm Hg. Max gradient (S): 47 mmHg. Valve area (VTI): 1   cm^2. Valve area (Vmean): 1.08 cm^2. - Mitral valve: Calcified annulus. Mildly thickened leaflets .   There was mild regurgitation. - Left atrium: The atrium was normal in size. - Inferior vena cava: The vessel was normal in size. The   respirophasic diameter changes were in the normal range (>= 50%),   consistent with normal central venous pressure.  Impressions:  - Compared to a prior study in 12/2017, the LVEF is lower at 40-45%   with global hypokinesis, elevated LV  filling pressure and at   least moderate aortic stenosis - mean gradient of 30 mmHg   (consider low flow, low gradient severe AS).  Patient Profile     68 y.o. male with PMH of ESRD on HD (MWF), AL amyloid (kidney involvement), aortic stenosis, HTN, HLD, GERD, anemia, chronic combined CHF and CAD presented with SOB. He was treated for CHF and elevated trop.   Assessment & Plan    1. NSTEMI  - troponin does show ACS pattern. EKG shows progressive ST depression in inferolateral leads. Will discuss with MD regarding  cardiac cath today  2. Chronic combined systolic and diastolic heart failure  - EF 35% by echo 11/26/2017, improved to 65-70% by echo 12/19/2017  - Echo 03/24/2018 EF 40-45%, moderate LVH, moderate AS.  - managed through dialysis  3. CAD: previous cath in 11/2017 showed 60% LCx, 100% RCA lesion, failed to engage the catheter, therefore medically managed  4. ESRD on HD  5. Moderate AS  For questions or updates, please contact [place] Please consult [email or web address] for contact info under     Signed, Almyra Deforest, [place]  03/25/2018, 9:54 AM    Patient seen, examined. Available data reviewed. Agree with findings, assessment, and plan as outlined by Almyra Deforest, PA-C.  The patient is independently interviewed and examined.  Please see changes above to the patient's exam to reflect my clinical findings.  Notably, he has a late peaking 2/6 to 3/6 systolic murmur at the right upper sternal border with diminished A2.  His heart sounds are distant.  Lung fields are clear.  Extremities show trace pretibial edema.  The patient's troponin increased to greater than 5 mg/dL in the setting of progressive shortness of breath and chest tightness.  He just underwent cardiac catheterization in September 2019 demonstrating total occlusion of the RCA and moderate stenosis of the left circumflex origin and mid LAD.  His cardiac catheterization and echo images are reviewed.  He does have severe calcification  of the aortic valve with restricted leaflet motion and a mean transvalvular gradient of 30 mmHg in the setting of reduced LVEF of 40 to 45%.  Considering his progressive symptoms, elevated troponin, and concern for low flow low gradient aortic stenosis, I have recommended right and left heart catheterization with possible PCI.  I have reviewed the risks, indications, and alternatives to cardiac catheterization, possible angioplasty, and stenting with the patient. Risks include but are not limited to bleeding, infection, vascular injury, stroke, myocardial infection, arrhythmia, kidney injury, radiation-related injury in the case of prolonged fluoroscopy use, emergency cardiac surgery, and death. The patient understands the risks of serious complication is 1-2 in 7654 with diagnostic cardiac cath and 1-2% or less with angioplasty/stenting. Of note, at the time of his last cath, the RCA could not be cannulated. However, it is demonstrated to be occluded based on the presence of left-to-right collaterals.  Sherren Mocha, M.D. 03/25/2018 11:10 AM

## 2018-03-25 NOTE — Plan of Care (Signed)
  Problem: Clinical Measurements: Goal: Will remain free from infection Outcome: Progressing Goal: Respiratory complications will improve Outcome: Progressing   Problem: Activity: Goal: Risk for activity intolerance will decrease Outcome: Progressing   Problem: Elimination: Goal: Will not experience complications related to bowel motility Outcome: Progressing   Problem: Safety: Goal: Ability to remain free from injury will improve Outcome: Progressing   Problem: Pain Managment: Goal: General experience of comfort will improve Outcome: Completed/Met

## 2018-03-25 NOTE — Interval H&P Note (Signed)
History and Physical Interval Note:  03/25/2018 12:58 PM  Jose Garrett  has presented today for surgery, with the diagnosis of ns  The various methods of treatment have been discussed with the patient and family. After consideration of risks, benefits and other options for treatment, the patient has consented to  Procedure(s): LEFT HEART CATH AND CORONARY ANGIOGRAPHY (N/A) Ultrasound Guidance For Vascular Access as a surgical intervention .  The patient's history has been reviewed, patient examined, no change in status, stable for surgery.  I have reviewed the patient's chart and labs.  Questions were answered to the patient's satisfaction.    Cath Lab Visit (complete for each Cath Lab visit)  Clinical Evaluation Leading to the Procedure:   ACS: Yes.    Non-ACS:    Anginal Classification: CCS III  Anti-ischemic medical therapy: No Therapy  Non-Invasive Test Results: No non-invasive testing performed  Prior CABG: No previous CABG       [name] 03/25/2018 12:58 PM

## 2018-03-25 NOTE — Progress Notes (Addendum)
Family Medicine Teaching Service Daily Progress Note Intern Pager: [phone]  Patient name: Jose Garrett Medical record number: [id number] Date of birth: 1950/07/02 Age: 68 y.o. Gender: male  Primary Care Provider: [name], MD Consultants: cards, nephro Code Status: full  Pt Overview and Major Events to Date:  1/12 admitted  Assessment and Plan: Jose Garrett is a 68 y.o. male presenting with increasing dyspnea on exertion and chest tightness x 4 days . PMH is significant for  ESRD on HD (MWF), CHF, CAD, amyloidosis, HTN, HLD, Anxiety.   NSTEMI- H/o CAD, NSTEMI 11/2017 troponins trended up overnight. 0.1>0.6>2.56>3.84>5.13>5.93. patient was started on IV heparin per cards recs and his troponins subsequently decreased to 5.02 at latest draw. Patient received dialysis yesterday minus >3L fluids which should have greatly improved any fluid overload from previous incomplete dialysis. Patient states that he has been able to ambulate in hallway without dyspnea. He denies any current chest pain or SOB. Appears to be euvolemic on exam- no LE edema, clear lungs, no JVD present. Cardiology to do cath today. Consulted cards, appreciate recs - f/u cath - continuous cardiac telemetry - cont home ASA, Plavix, Lipitor - continue carafate - cont to monitor for chest pain - avoid QTc prolonging medications - continue heparin drip pending cards recs and/or interventions - daily weights, strict I/Os - nitro PRN  Chronic Combined CHF- BNP 362.8 on admission with CXR with b/l pulmonary edema. Patient anuric. Patient weight decreased from 85kg to 83kg yesterday after dialysis. Will continue to monitor today - strict I/O's - daily weights - diet with fluid restriction  ESRD on HD MWF- Received HD yesterday with >3L off. Nephrologist ordered hep B surface antigen yesterday which came back negative - f/u nephro for dialysis - renal panel am - cont home velphoro, rena-vit  Normocytic  Anemia- Likely 2/2 ESRD.  Hgb 8.6 today, baseline appears ~9.  Takes Vitamin B12 at home. - cont home Vit B12 - CBC am  Thrombocytopenia- chronic. Decreased to 77 today from 87 yesterday. No signs of active bleeding but will monitor closely given patient is also on heparin drip currently.  - CBC am - ordered immature platelet fraction today  Neuropathy: Chronic BLE, poorly controlled and worse with dialysis treatments- patient was able to tolerate full HD treatment yesterday but was uncomfortable. Takes gabapentin '300mg'$  TID at home.  - cont home gabapentin - discuss with nephrology on treatment dose/timing for improvement of symptoms  Chronic Back Pain Takes flexeril '5mg'$  BID PRN at home.  Also takes Oxy IR and Oxycontin prn, which he states is rarely. - cont home flexeril - hold home oxy IR and oxycontin  AL Amyloidosis Patient with negative BM bx for multiple myeloma.  Follows with Oncology, not currently on treatment.  Last seen in 09/2017. - no intervention needed at this time  HTN BP well controlled without medications, as has hypotension with HD.  Nephrology managing with HD. BP this morning 137/67 - HD on MWF  HLD On Lipitor at home.  Last Lipid Panel 02/12/18.  Total cholesterol 117, LDL 50, Tgs 174, HDL 32. - cont home lipitor  FEN/GI: Renal with fluid restriciton Prophylaxis: discontinued Hep SubQ, currently on heparin drip  Disposition: following for cardiac ischemia workup and nephrology tx  Subjective:  Patient feels well today. Has no concerns or complaints. Is able to tolerate ambulation without dyspnea. He was able to tolerate all of HD session yesterday. He is a very pleasant man.   Objective: Temp:  [  97.4 F (36.3 C)-98.9 F (37.2 C)] 98 F (36.7 C) (01/14 0402) Pulse Rate:  [71-87] 86 (01/14 0402) Resp:  [10-22] 18 (01/14 0402) BP: (108-154)/(55-71) 137/67 (01/14 0402) SpO2:  [82 %-98 %] 97 % (01/14 0402) Weight:  [82.5 kg-85.5 kg] 83 kg (01/14  0402)   Physical Exam: General: sitting in bed- one foot on floor, pleasant, cooperative Cardiovascular: RRR, no murmur appreciated Respiratory: CTAB, no wheezing or increased WOB Abdomen: soft, non-tender, non-distended Extremities: lower extremities no edema bilaterally. No rashes. Right arm access normal- no bleeding.  Laboratory: Recent Labs  Lab 03/23/18 1620 03/24/18 0756  WBC 6.0 5.8  HGB 8.9* 8.4*  HCT 28.1* 26.3*  PLT 97* 87*   Recent Labs  Lab 03/23/18 1620 03/24/18 0214  NA 139 138  K 3.9 4.2  CL 96* 97*  CO2 27 28  BUN 49* 56*  CREATININE 10.20* 11.10*  CALCIUM 9.6 9.2  PROT 7.2  --   BILITOT 0.5  --   ALKPHOS 44  --   ALT 18  --   AST 22  --   GLUCOSE 108* 106*   Hepatitis B surface Ag- negative troponins 0.1>0.6>2.56>3.84>5.13>5.93>5.02>4.51  Imaging/Diagnostic Tests: Dg Chest 2 View  Result Date: 03/23/2018 CLINICAL DATA:  Acute onset of shortness of breath 4 days ago, worse with exertion. Dialysis treatment 2 days ago was shortened due to neuropathic pain. Current history of CHF. EXAM: CHEST - 2 VIEW COMPARISON:  11/27/2017 and earlier. FINDINGS: Cardiac silhouette mildly to moderately enlarged, unchanged. Thoracic aorta mildly atherosclerotic, unchanged. Hilar and mediastinal contours otherwise unremarkable. Minimal diffuse interstitial pulmonary edema as evidenced by Awanda Mink B lines. Lungs otherwise clear. No pleural effusions. No confluent airspace consolidation. Visualized bony thorax intact. IMPRESSION: Minimal acute CHF, with stable mild to moderate cardiomegaly and minimal diffuse interstitial pulmonary edema. Electronically Signed   By: Evangeline Dakin M.D.   On: 03/23/2018 17:26     Richarda Osmond, DO 03/25/2018, 7:21 AM PGY-1, [place] Intern pager: [phone], text pages welcome

## 2018-03-25 NOTE — Progress Notes (Signed)
[facility] NEPHROLOGY PROGRESS NOTE  Assessment/ Plan: Pt is a 68 y.o. yo male  with hypertension, amyloidosis, CAD, neuropathy, ESRD on dialysis at [facility] MWF, last treatment on 1/10 which was incomplete because of discomfort related with cramping/neuropathy admitted with dyspnea on exertion, pulmonary edema.  Dialysis Orders:  MWF at [facility] 4hr, 400/800, EDW 81kg, 2K/2Ca, UF#2, AVF, heparin 1500 bolus - Mircera 81mcg IV q 2 weeks (last 1/8)  # ESRD:MWF, status post dialysis yesterday with 3 L UF, tolerated well.  Plan for next dialysis tomorrow.  Volume status looks acceptable today.  # Anemia: Hemoglobin 8.6.  She received Mircera on 1/8.  Monitor CBC.  # Secondary hyperparathyroidism: CEA, phosphorus acceptable.  Continue Velphoro.  # HTN/volume: Blood pressure and volume status acceptable.  # Dyspnea/pulmonary edema: Status post dialysis yesterday with improvement.  He is on room air today.  # NSTEMI: Currently on IV heparin.  He has no chest pain.  Possibly cardiac cath today.   Subjective: Seen and examined at bedside.  Sitting in bed comfortable.  On room air.  Denied chest pain shortness of breath.  No nausea vomiting. Objective Vital signs in last 24 hours: Vitals:   03/24/18 1643 03/24/18 1939 03/24/18 2359 03/25/18 0402  BP: (!) 125/57 (!) 108/57 121/60 137/67  Pulse: 83 86 87 86  Resp: 18 18 18 18   Temp: (!) 97.5 F (36.4 C) 98.9 F (37.2 C) 98.6 F (37 C) 98 F (36.7 C)  TempSrc: Oral Oral Oral Oral  SpO2: (!) 82% 98% 98% 97%  Weight:    83 kg  Height:       Weight change: 0.813 kg  Intake/Output Summary (Last 24 hours) at 03/25/2018 1105 Last data filed at 03/25/2018 0300 Gross per 24 hour  Intake 306.25 ml  Output 3000 ml  Net -2693.75 ml       Labs: Basic Metabolic Panel: Recent Labs  Lab 03/23/18 1620 03/24/18 0214  NA 139 138  K 3.9 4.2  CL 96* 97*  CO2 27 28  GLUCOSE 108* 106*  BUN 49* 56*  CREATININE 10.20* 11.10*   CALCIUM 9.6 9.2  PHOS  --  5.4*   Liver Function Tests: Recent Labs  Lab 03/23/18 1620 03/24/18 0214  AST 22  --   ALT 18  --   ALKPHOS 44  --   BILITOT 0.5  --   PROT 7.2  --   ALBUMIN 3.8 3.6   No results for input(s): LIPASE, AMYLASE in the last 168 hours. No results for input(s): AMMONIA in the last 168 hours. CBC: Recent Labs  Lab 03/23/18 1620 03/24/18 0756 03/25/18 0715  WBC 6.0 5.8 6.4  NEUTROABS 3.9  --   --   HGB 8.9* 8.4* 8.6*  HCT 28.1* 26.3* 26.9*  MCV 96.2 96.3 96.8  PLT 97* 87* 77*   Cardiac Enzymes: Recent Labs  Lab 03/24/18 0756 03/24/18 1343 03/24/18 1921 03/25/18 0035 03/25/18 0715  TROPONINI 3.84* 5.13* 5.93* 5.02* 4.51*   CBG: No results for input(s): GLUCAP in the last 168 hours.  Iron Studies: No results for input(s): IRON, TIBC, TRANSFERRIN, FERRITIN in the last 72 hours. Studies/Results: Dg Chest 2 View  Result Date: 03/23/2018 CLINICAL DATA:  Acute onset of shortness of breath 4 days ago, worse with exertion. Dialysis treatment 2 days ago was shortened due to neuropathic pain. Current history of CHF. EXAM: CHEST - 2 VIEW COMPARISON:  11/27/2017 and earlier. FINDINGS: Cardiac silhouette mildly to moderately enlarged, unchanged. Thoracic aorta  mildly atherosclerotic, unchanged. Hilar and mediastinal contours otherwise unremarkable. Minimal diffuse interstitial pulmonary edema as evidenced by Awanda Mink B lines. Lungs otherwise clear. No pleural effusions. No confluent airspace consolidation. Visualized bony thorax intact. IMPRESSION: Minimal acute CHF, with stable mild to moderate cardiomegaly and minimal diffuse interstitial pulmonary edema. Electronically Signed   By: Evangeline Dakin M.D.   On: 03/23/2018 17:26    Medications: Infusions: . heparin 1,150 Units/hr (03/25/18 0040)    Scheduled Medications: . aspirin EC  81 mg Oral Daily  . atorvastatin  80 mg Oral q1800  . Chlorhexidine Gluconate Cloth  6 each Topical Q0600  .  clopidogrel  75 mg Oral Daily  . gabapentin  300 mg Oral TID  . [START ON 03/26/2018] multivitamin  1 tablet Oral QHS  . sucralfate  1 g Oral TID WC & HS  . sucroferric oxyhydroxide  500-1,000 mg Oral TID WC  . vitamin B-12  2,500 mcg Oral Daily    have reviewed scheduled and prn medications.  Physical Exam: General:NAD, comfortable Heart:RRR, s1s2 nl Lungs:clear b/l, no crackle Abdomen:soft, Non-tender, non-distended Extremities:No edema Dialysis Access: AVR has good thrill and bruit.  Dron Prasad Bhandari 03/25/2018,11:05 AM  LOS: 0 days

## 2018-03-25 NOTE — H&P (View-Only) (Signed)
Progress Note  Patient Name: Jose Garrett Date of Encounter: 03/25/2018  Primary Cardiologist: Lauree Chandler, MD   Subjective   Has been having some worsening DOE recently with minimal activity, also had band like sensation across the chest pain. Chest discomfort improved. He did mention his dialysis prior to arrival was cut short a hour early.   Inpatient Medications    Scheduled Meds: . aspirin EC  81 mg Oral Daily  . atorvastatin  80 mg Oral q1800  . Chlorhexidine Gluconate Cloth  6 each Topical Q0600  . clopidogrel  75 mg Oral Daily  . gabapentin  300 mg Oral TID  . multivitamin  1 tablet Oral Daily  . sucralfate  1 g Oral TID WC & HS  . sucroferric oxyhydroxide  500-1,000 mg Oral TID WC  . vitamin B-12  2,500 mcg Oral Daily   Continuous Infusions: . heparin 1,150 Units/hr (03/25/18 0040)   PRN Meds: acetaminophen, cyclobenzaprine   Vital Signs    Vitals:   03/24/18 1643 03/24/18 1939 03/24/18 2359 03/25/18 0402  BP: (!) 125/57 (!) 108/57 121/60 137/67  Pulse: 83 86 87 86  Resp: 18 18 18 18   Temp: (!) 97.5 F (36.4 C) 98.9 F (37.2 C) 98.6 F (37 C) 98 F (36.7 C)  TempSrc: Oral Oral Oral Oral  SpO2: (!) 82% 98% 98% 97%  Weight:    83 kg  Height:        Intake/Output Summary (Last 24 hours) at 03/25/2018 0954 Last data filed at 03/25/2018 0300 Gross per 24 hour  Intake 306.25 ml  Output 3000 ml  Net -2693.75 ml   Last 3 Weights 03/25/2018 03/24/2018 03/24/2018  Weight (lbs) 183 lb 181 lb 14.1 oz 188 lb 7.9 oz  Weight (kg) 83.008 kg 82.5 kg 85.5 kg      Telemetry    NSR - Personally Reviewed  ECG    NSR with worsening ST depression in inferolateral leads- Personally Reviewed  Physical Exam   GEN: No acute distress.   Neck: No JVD Cardiac: RRR, 3/6 harsh systolic murmur at the RUSB, distant heart sounds, no diastolic murmur Respiratory: Clear to auscultation bilaterally. GI: Soft, nontender, non-distended  MS: trace bilateral  pretibial edema; No deformity. Neuro:  Nonfocal  Psych: Normal affect   Labs    Chemistry Recent Labs  Lab 03/23/18 1620 03/24/18 0214  NA 139 138  K 3.9 4.2  CL 96* 97*  CO2 27 28  GLUCOSE 108* 106*  BUN 49* 56*  CREATININE 10.20* 11.10*  CALCIUM 9.6 9.2  PROT 7.2  --   ALBUMIN 3.8 3.6  AST 22  --   ALT 18  --   ALKPHOS 44  --   BILITOT 0.5  --   GFRNONAA 5* 4*  GFRAA 5* 5*  ANIONGAP 16* 13     Hematology Recent Labs  Lab 03/23/18 1620 03/24/18 0756 03/25/18 0715  WBC 6.0 5.8 6.4  RBC 2.92* 2.73* 2.78*  HGB 8.9* 8.4* 8.6*  HCT 28.1* 26.3* 26.9*  MCV 96.2 96.3 96.8  MCH 30.5 30.8 30.9  MCHC 31.7 31.9 32.0  RDW 14.1 14.4 14.5  PLT 97* 87* 77*    Cardiac Enzymes Recent Labs  Lab 03/24/18 1343 03/24/18 1921 03/25/18 0035 03/25/18 0715  TROPONINI 5.13* 5.93* 5.02* 4.51*   No results for input(s): TROPIPOC in the last 168 hours.   BNP Recent Labs  Lab 03/23/18 1620  BNP 362.8*     DDimer No results  for input(s): DDIMER in the last 168 hours.   Radiology    Dg Chest 2 View  Result Date: 03/23/2018 CLINICAL DATA:  Acute onset of shortness of breath 4 days ago, worse with exertion. Dialysis treatment 2 days ago was shortened due to neuropathic pain. Current history of CHF. EXAM: CHEST - 2 VIEW COMPARISON:  11/27/2017 and earlier. FINDINGS: Cardiac silhouette mildly to moderately enlarged, unchanged. Thoracic aorta mildly atherosclerotic, unchanged. Hilar and mediastinal contours otherwise unremarkable. Minimal diffuse interstitial pulmonary edema as evidenced by Awanda Mink B lines. Lungs otherwise clear. No pleural effusions. No confluent airspace consolidation. Visualized bony thorax intact. IMPRESSION: Minimal acute CHF, with stable mild to moderate cardiomegaly and minimal diffuse interstitial pulmonary edema. Electronically Signed   By: Evangeline Dakin M.D.   On: 03/23/2018 17:26    Cardiac Studies   Cath 11/25/2017  Prox LAD lesion is 40%  stenosed.  Ramus lesion is 40% stenosed.  Ost Cx to Prox Cx lesion is 70% stenosed.  Ost RCA to Mid RCA lesion is 100% stenosed.  LV end diastolic pressure is normal.  There is moderate aortic valve stenosis.   1. Single vessel occlusive CAD with occluded RCA. Unable to engage with a catheter.  2. Normal LVEDP 19 mm Hg 3. AV gradient of 25 mm Hg peak to peak c/w moderate Aortic stenosis.   Plan: will transfer to ICU. Ventilator management per ICU. Dialysis per Nephrology service. Will resume IV heparin 8 hours post sheath removal and continue to cycle cardiac enzymes. It is unclear at this time whether RCA occlusion is chronic or new. Given inability to engage vessel he is not a candidate for PCI. No significant obstructive disease in the LCA.   Recommend Aspirin 81mg  daily for moderate CAD.    Echo 03/24/2018 LV EF: 40% -   45% Study Conclusions  - Left ventricle: The cavity size was normal. Wall thickness was   increased in a pattern of moderate LVH. Systolic function was   mildly to moderately reduced. The estimated ejection fraction was   in the range of 40% to 45%. Diffuse hypokinesis. Doppler   parameters are consistent with abnormal left ventricular   relaxation (grade 1 diastolic dysfunction). The E/e&' ratio is   >15, suggesting elevated LV filling pressure. - Aortic valve: Calcified with moderate stenosis. Mean gradient   (S): 30 mm Hg. Max gradient (S): 47 mmHg. Valve area (VTI): 1   cm^2. Valve area (Vmean): 1.08 cm^2. - Mitral valve: Calcified annulus. Mildly thickened leaflets .   There was mild regurgitation. - Left atrium: The atrium was normal in size. - Inferior vena cava: The vessel was normal in size. The   respirophasic diameter changes were in the normal range (>= 50%),   consistent with normal central venous pressure.  Impressions:  - Compared to a prior study in 12/2017, the LVEF is lower at 40-45%   with global hypokinesis, elevated LV  filling pressure and at   least moderate aortic stenosis - mean gradient of 30 mmHg   (consider low flow, low gradient severe AS).  Patient Profile     68 y.o. male with PMH of ESRD on HD (MWF), AL amyloid (kidney involvement), aortic stenosis, HTN, HLD, GERD, anemia, chronic combined CHF and CAD presented with SOB. He was treated for CHF and elevated trop.   Assessment & Plan    1. NSTEMI  - troponin does show ACS pattern. EKG shows progressive ST depression in inferolateral leads. Will discuss with MD regarding  cardiac cath today  2. Chronic combined systolic and diastolic heart failure  - EF 35% by echo 11/26/2017, improved to 65-70% by echo 12/19/2017  - Echo 03/24/2018 EF 40-45%, moderate LVH, moderate AS.  - managed through dialysis  3. CAD: previous cath in 11/2017 showed 60% LCx, 100% RCA lesion, failed to engage the catheter, therefore medically managed  4. ESRD on HD  5. Moderate AS  For questions or updates, please contact [place] Please consult [email or web address] for contact info under     Signed, Almyra Deforest, [place]  03/25/2018, 9:54 AM    Patient seen, examined. Available data reviewed. Agree with findings, assessment, and plan as outlined by Almyra Deforest, PA-C.  The patient is independently interviewed and examined.  Please see changes above to the patient's exam to reflect my clinical findings.  Notably, he has a late peaking 2/6 to 3/6 systolic murmur at the right upper sternal border with diminished A2.  His heart sounds are distant.  Lung fields are clear.  Extremities show trace pretibial edema.  The patient's troponin increased to greater than 5 mg/dL in the setting of progressive shortness of breath and chest tightness.  He just underwent cardiac catheterization in September 2019 demonstrating total occlusion of the RCA and moderate stenosis of the left circumflex origin and mid LAD.  His cardiac catheterization and echo images are reviewed.  He does have severe calcification  of the aortic valve with restricted leaflet motion and a mean transvalvular gradient of 30 mmHg in the setting of reduced LVEF of 40 to 45%.  Considering his progressive symptoms, elevated troponin, and concern for low flow low gradient aortic stenosis, I have recommended right and left heart catheterization with possible PCI.  I have reviewed the risks, indications, and alternatives to cardiac catheterization, possible angioplasty, and stenting with the patient. Risks include but are not limited to bleeding, infection, vascular injury, stroke, myocardial infection, arrhythmia, kidney injury, radiation-related injury in the case of prolonged fluoroscopy use, emergency cardiac surgery, and death. The patient understands the risks of serious complication is 1-2 in 2229 with diagnostic cardiac cath and 1-2% or less with angioplasty/stenting. Of note, at the time of his last cath, the RCA could not be cannulated. However, it is demonstrated to be occluded based on the presence of left-to-right collaterals.  Sherren Mocha, M.D. 03/25/2018 11:10 AM

## 2018-03-25 NOTE — Progress Notes (Signed)
[place] for Heparin Indication: chest pain/ACS  Allergies  Allergen Reactions  . No Known Allergies     Patient Measurements: Height: 5\' 10"  (177.8 cm) Weight: 183 lb (83 kg)(scale a) IBW/kg (Calculated) : 73 Heparin Dosing Weight: TBW  Vital Signs: Temp: 98 F (36.7 C) (01/14 0402) Temp Source: Oral (01/14 0402) BP: 137/67 (01/14 0402) Pulse Rate: 86 (01/14 0402)  Labs: Recent Labs    03/23/18 1620  03/24/18 0214 03/24/18 0756 03/24/18 1343 03/24/18 1921 03/25/18 0035 03/25/18 0715  HGB 8.9*  --   --  8.4*  --   --   --  8.6*  HCT 28.1*  --   --  26.3*  --   --   --  26.9*  PLT 97*  --   --  87*  --   --   --  77*  HEPARINUNFRC  --   --   --   --   --   --   --  0.67  CREATININE 10.20*  --  11.10*  --   --   --   --   --   TROPONINI 0.10*   < > 2.56* 3.84* 5.13* 5.93* 5.02*  --    < > = values in this interval not displayed.    Estimated Creatinine Clearance: 6.9 mL/min (A) (by C-G formula based on SCr of 11.1 mg/dL (H)).   Assessment: 68 y.o. M presents with elevated trop (up to 5.93). To begin heparin per pharmacy for ACS. May need cardiac cath per cardiology if trop continues to rise or chest pain.   Heparin level is therapeutic at 0.67 on 1150 units/hr. No bleeding noted, Hgb stable in 8s, platelets low on admit and continue to be low at 77. Troponin peaked at 5.93, down to 5.02 this morning.  Goal of Therapy:  Heparin level 0.3-0.7 units/ml Monitor platelets by anticoagulation protocol: Yes   Plan:  Continue heparin gtt at 1150 units/hr Confirmatory heparin level at 14:00 Daily heparin level and CBC Monitor for s/sx of bleeding F/U Cards recommendations   Renold Genta, PharmD, BCPS Clinical Pharmacist Clinical phone for 03/25/2018 until 3p is [phone] 03/25/2018 8:08 AM  **Pharmacist phone directory can now be found on [email or web address] listed under [place]**

## 2018-03-25 NOTE — Progress Notes (Signed)
PT. With critical troponin of 5.02. On call for Family medicine paged to make aware.

## 2018-03-25 NOTE — Progress Notes (Signed)
[facility] for Heparin Indication: chest pain/ACS  Allergies  Allergen Reactions  . No Known Allergies     Patient Measurements: Height: 5\' 10"  (177.8 cm) Weight: 183 lb (83 kg)(scale a) IBW/kg (Calculated) : 73 Heparin Dosing Weight: TBW  Vital Signs: Temp: 98.2 F (36.8 C) (01/14 1522) Temp Source: Oral (01/14 1522) BP: 140/70 (01/14 1522) Pulse Rate: 87 (01/14 1522)  Labs: Recent Labs    03/23/18 1620  03/24/18 0214 03/24/18 0756  03/25/18 0035 03/25/18 0715 03/25/18 1445  HGB 8.9*  --   --  8.4*  --   --  8.6*  --   HCT 28.1*  --   --  26.3*  --   --  26.9*  --   PLT 97*  --   --  87*  --   --  77*  --   HEPARINUNFRC  --   --   --   --   --   --  0.67  --   CREATININE 10.20*  --  11.10*  --   --   --   --   --   TROPONINI 0.10*   < > 2.56* 3.84*   < > 5.02* 4.51* 4.39*   < > = values in this interval not displayed.    Estimated Creatinine Clearance: 6.9 mL/min (A) (by C-G formula based on SCr of 11.1 mg/dL (H)).   Assessment: 68 y.o. M presents with elevated trop (up to 5.93). To begin heparin per pharmacy for ACS. May need cardiac cath per cardiology if trop continues to rise or chest pain.   S/p cath this afternoon found to have single vessel CAD, but unchanged from previous results. Heparin stopped post cath, now being reordered. Will restart at previous rate and follow up length of therapy.   Goal of Therapy:  Heparin level 0.3-0.7 units/ml Monitor platelets by anticoagulation protocol: Yes   Plan:  Restart heparin gtt at 1100 units/hr tonight at 10pm Daily heparin level and CBC Monitor for s/sx of bleeding  Erin Hearing PharmD., BCPS Clinical Pharmacist 03/25/2018 6:26 PM

## 2018-03-26 ENCOUNTER — Encounter (HOSPITAL_COMMUNITY): Payer: Self-pay | Admitting: Cardiology

## 2018-03-26 DIAGNOSIS — R072 Precordial pain: Secondary | ICD-10-CM

## 2018-03-26 LAB — POCT I-STAT 3, ART BLOOD GAS (G3+)
Acid-Base Excess: 4 mmol/L — ABNORMAL HIGH (ref 0.0–2.0)
Bicarbonate: 28.3 mmol/L — ABNORMAL HIGH (ref 20.0–28.0)
O2 Saturation: 99 %
PO2 ART: 120 mmHg — AB (ref 83.0–108.0)
TCO2: 29 mmol/L (ref 22–32)
pCO2 arterial: 40.8 mmHg (ref 32.0–48.0)
pH, Arterial: 7.448 (ref 7.350–7.450)

## 2018-03-26 LAB — RENAL FUNCTION PANEL
Albumin: 3.7 g/dL (ref 3.5–5.0)
Anion gap: 15 (ref 5–15)
BUN: 60 mg/dL — ABNORMAL HIGH (ref 8–23)
CO2: 24 mmol/L (ref 22–32)
Calcium: 9.6 mg/dL (ref 8.9–10.3)
Chloride: 94 mmol/L — ABNORMAL LOW (ref 98–111)
Creatinine, Ser: 9.9 mg/dL — ABNORMAL HIGH (ref 0.61–1.24)
GFR calc Af Amer: 6 mL/min — ABNORMAL LOW (ref >60)
GFR calc non Af Amer: 5 mL/min — ABNORMAL LOW (ref >60)
Glucose, Bld: 91 mg/dL (ref 70–99)
Phosphorus: 4.6 mg/dL (ref 2.5–4.6)
Potassium: 5.5 mmol/L — ABNORMAL HIGH (ref 3.5–5.1)
Sodium: 133 mmol/L — ABNORMAL LOW (ref 135–145)

## 2018-03-26 LAB — CBC
HCT: 26.4 % — ABNORMAL LOW (ref 39.0–52.0)
Hemoglobin: 8.7 g/dL — ABNORMAL LOW (ref 13.0–17.0)
MCH: 31.5 pg (ref 26.0–34.0)
MCHC: 33 g/dL (ref 30.0–36.0)
MCV: 95.7 fL (ref 80.0–100.0)
NRBC: 0 % (ref 0.0–0.2)
Platelets: 82 10*3/uL — ABNORMAL LOW (ref 150–400)
RBC: 2.76 MIL/uL — AB (ref 4.22–5.81)
RDW: 14.7 % (ref 11.5–15.5)
WBC: 6.2 10*3/uL (ref 4.0–10.5)

## 2018-03-26 LAB — TROPONIN I
TROPONIN I: 2.93 ng/mL — AB (ref <0.03)
Troponin I: 3.92 ng/mL (ref <0.03)

## 2018-03-26 LAB — HEMOGLOBIN A1C
Hgb A1c MFr Bld: 6.2 % — ABNORMAL HIGH (ref 4.8–5.6)
Mean Plasma Glucose: 131.24 mg/dL

## 2018-03-26 LAB — TSH: TSH: 1.591 u[IU]/mL (ref 0.350–4.500)

## 2018-03-26 LAB — POCT ACTIVATED CLOTTING TIME: Activated Clotting Time: 158 seconds

## 2018-03-26 LAB — HEPARIN LEVEL (UNFRACTIONATED): HEPARIN UNFRACTIONATED: 0.19 [IU]/mL — AB (ref 0.30–0.70)

## 2018-03-26 MED ORDER — LIDOCAINE HCL (PF) 1 % IJ SOLN: 5.0000 mL | INTRAMUSCULAR | Status: DC | PRN

## 2018-03-26 MED ORDER — SODIUM CHLORIDE 0.9 % IV SOLN: 100.0000 mL | INTRAVENOUS | Status: DC | PRN

## 2018-03-26 MED ORDER — ALTEPLASE 2 MG IJ SOLR: 2.0000 mg | Freq: Once | INTRAMUSCULAR | Status: DC | PRN

## 2018-03-26 MED ORDER — PENTAFLUOROPROP-TETRAFLUOROETH EX AERO: 1.0000 "application " | INHALATION_SPRAY | CUTANEOUS | Status: DC | PRN

## 2018-03-26 MED ORDER — HEPARIN SODIUM (PORCINE) 1000 UNIT/ML DIALYSIS: 1000.0000 [IU] | INTRAMUSCULAR | Status: DC | PRN

## 2018-03-26 MED ORDER — LIDOCAINE-PRILOCAINE 2.5-2.5 % EX CREA: 1.0000 "application " | TOPICAL_CREAM | CUTANEOUS | Status: DC | PRN

## 2018-03-26 MED ORDER — HEPARIN SODIUM (PORCINE) 1000 UNIT/ML DIALYSIS: 1500.0000 [IU] | Freq: Once | INTRAMUSCULAR | Status: DC

## 2018-03-26 MED ORDER — VENLAFAXINE HCL ER 37.5 MG PO CP24: 37.5000 mg | ORAL_CAPSULE | Freq: Every day | ORAL | 0 refills | Status: DC

## 2018-03-26 NOTE — Procedures (Signed)
Patient was seen on dialysis and the procedure was supervised.  BFR 400  Via AVF BP is  108/60.   Patient appears to be tolerating treatment well  Louis Meckel 03/26/2018

## 2018-03-26 NOTE — Progress Notes (Signed)
ANTICOAGULATION CONSULT NOTE - Follow Up Consult  Pharmacy Consult for Heparin Indication: chest pain/ACS  Allergies  Allergen Reactions  . No Known Allergies     Patient Measurements: Height: 5\' 10"  (177.8 cm) Weight: 182 lb 6.4 oz (82.7 kg) IBW/kg (Calculated) : 73 Heparin Dosing Weight: 84.7 kg  Vital Signs: Temp: 98.7 F (37.1 C) (01/15 0520) Temp Source: Oral (01/15 0520) BP: 145/63 (01/15 0520) Pulse Rate: 78 (01/15 0520)  Labs: Recent Labs    03/23/18 1620  03/24/18 0214 03/24/18 0756  03/25/18 0715 03/25/18 1445 03/25/18 2124 03/26/18 0345  HGB 8.9*  --   --  8.4*  --  8.6*  --   --  8.7*  HCT 28.1*  --   --  26.3*  --  26.9*  --   --  26.4*  PLT 97*  --   --  87*  --  77*  --   --  82*  HEPARINUNFRC  --   --   --   --   --  0.67  --   --  0.19*  CREATININE 10.20*  --  11.10*  --   --   --   --   --   --   TROPONINI 0.10*   < > 2.56* 3.84*   < > 4.51* 4.39* 3.79* 3.92*   < > = values in this interval not displayed.    Estimated Creatinine Clearance: 6.9 mL/min (A) (by C-G formula based on SCr of 11.1 mg/dL (H)).   Assessment: 68 yo M presenting with elevated troponins (peak 5.93, now 3.92). Heparin per pharmacy for ACS. S/p cath 1/14 afternoon found to have single vessel CAD, but unchanged from previous results. Heparin restarted post cath.   Heparin level subtherapeutic at 0.19 on 1100 units/hr. No bleeding noted and confirmed with nurse. H & platelets low, but stable. Patient previously therapeutic (0.67) on initial dose of 1150 units/hr.  Goal of Therapy:  Heparin level 0.3-0.7 units/ml Monitor platelets by anticoagulation protocol: Yes   Plan:  Modestly increase heparin infusion to 1200 units/hr Check anti-Xa level in 8 hours and daily while on heparin Continue to monitor H&H and platelets   Avyukt Cimo 03/26/2018,7:21 AM

## 2018-03-26 NOTE — Plan of Care (Signed)
  Problem: Education: Goal: Knowledge of General Education information will improve Description Including pain rating scale, medication(s)/side effects and non-pharmacologic comfort measures Outcome: Progressing   Problem: Clinical Measurements: Goal: Will remain free from infection Outcome: Progressing Goal: Diagnostic test results will improve Outcome: Progressing   Problem: Activity: Goal: Risk for activity intolerance will decrease Outcome: Progressing   Problem: Safety: Goal: Ability to remain free from injury will improve Outcome: Progressing   Problem: Activity: Goal: Capacity to carry out activities will improve Outcome: Progressing

## 2018-03-26 NOTE — Consult Note (Signed)
   [facility] CM Inpatient Consult   03/26/2018  Jose Garrett Apr 26, 1950 [id number]  Patient screened for high risk score for unplanned readmission in a patient with Medicare. Patient is a 68year old who have been having some worsening DOE recently with minimal activity, also had band like sensation across the chest pain. Chest discomfort improved. He did mention his dialysis prior to arrival was cut short a hour early per MD notes. Met with the patient at the bedside.  Patient states he has been managing fair.  He drives to HD to [employer] location on MWF schedule.  He states he does not feel he need weekly calls as he is seen by medical staff 3 days a week.  He did accept a brochure with 24 hour nurse advise line with contact information. Patient endorses Dr. [name], Jose Garrett as his primary care provider. He is agreeable to post hospital EMMI calls.  For questions, please contact:  Natividad Brood, RN BSN [facility] Liaison  [phone] business mobile phone Toll free office [phone]

## 2018-03-26 NOTE — Progress Notes (Signed)
Progress Note  Patient Name: Jose Garrett Date of Encounter: 03/26/2018  Primary Cardiologist: Lauree Chandler, MD   Subjective   Breathing is better this am. No chest tightness.   Inpatient Medications    Scheduled Meds: . aspirin EC  81 mg Oral Daily  . atorvastatin  80 mg Oral q1800  . Chlorhexidine Gluconate Cloth  6 each Topical Q0600  . clopidogrel  75 mg Oral Daily  . gabapentin  300 mg Oral TID  . heparin  1,500 Units Dialysis Once in dialysis  . multivitamin  1 tablet Oral QHS  . sodium chloride flush  3 mL Intravenous Q12H  . sucroferric oxyhydroxide  500-1,000 mg Oral TID WC  . vitamin B-12  2,500 mcg Oral Daily   Continuous Infusions: . sodium chloride    . sodium chloride    . sodium chloride    . heparin 1,200 Units/hr (03/26/18 0801)   PRN Meds: sodium chloride, sodium chloride, sodium chloride, acetaminophen, alteplase, cyclobenzaprine, heparin, lidocaine (PF), lidocaine-prilocaine, ondansetron (ZOFRAN) IV, pentafluoroprop-tetrafluoroeth, sodium chloride flush   Vital Signs    Vitals:   03/26/18 0900 03/26/18 0930 03/26/18 1000 03/26/18 1015  BP: (!) 101/45 108/60 (!) 86/50 (!) 110/41  Pulse: 78 79 85 81  Resp:      Temp:      TempSrc:      SpO2:      Weight:      Height:        Intake/Output Summary (Last 24 hours) at 03/26/2018 1031 Last data filed at 03/26/2018 0300 Gross per 24 hour  Intake 494.38 ml  Output -  Net 494.38 ml   Last 3 Weights 03/26/2018 03/26/2018 03/25/2018  Weight (lbs) 182 lb 15.7 oz 182 lb 6.4 oz 183 lb  Weight (kg) 83 kg 82.736 kg 83.008 kg      Telemetry    sinus - Personally Reviewed  ECG    No am ekg- Personally Reviewed  Physical Exam   GEN: No acute distress.   Neck: No JVD Cardiac: RRR, systolic murmur noted.   Respiratory: Clear to auscultation bilaterally. GI: Soft, nontender, non-distended  MS: No edema; No deformity. Neuro:  Nonfocal  Psych: Normal affect   Labs     Chemistry Recent Labs  Lab 03/23/18 1620 03/24/18 0214 03/26/18 0756  NA 139 138 133*  K 3.9 4.2 5.5*  CL 96* 97* 94*  CO2 27 28 24   GLUCOSE 108* 106* 91  BUN 49* 56* 60*  CREATININE 10.20* 11.10* 9.90*  CALCIUM 9.6 9.2 9.6  PROT 7.2  --   --   ALBUMIN 3.8 3.6 3.7  AST 22  --   --   ALT 18  --   --   ALKPHOS 44  --   --   BILITOT 0.5  --   --   GFRNONAA 5* 4* 5*  GFRAA 5* 5* 6*  ANIONGAP 16* 13 15     Hematology Recent Labs  Lab 03/24/18 0756 03/25/18 0715 03/26/18 0345  WBC 5.8 6.4 6.2  RBC 2.73* 2.78* 2.76*  HGB 8.4* 8.6* 8.7*  HCT 26.3* 26.9* 26.4*  MCV 96.3 96.8 95.7  MCH 30.8 30.9 31.5  MCHC 31.9 32.0 33.0  RDW 14.4 14.5 14.7  PLT 87* 77* 82*    Cardiac Enzymes Recent Labs  Lab 03/25/18 0715 03/25/18 1445 03/25/18 2124 03/26/18 0345  TROPONINI 4.51* 4.39* 3.79* 3.92*   No results for input(s): TROPIPOC in the last 168 hours.   BNP  Recent Labs  Lab 03/23/18 1620  BNP 362.8*     DDimer No results for input(s): DDIMER in the last 168 hours.   Radiology    No results found.  Cardiac Studies   Echo 03/24/2018 LV EF: 40% - 45% Study Conclusions  - Left ventricle: The cavity size was normal. Wall thickness was increased in a pattern of moderate LVH. Systolic function was mildly to moderately reduced. The estimated ejection fraction was in the range of 40% to 45%. Diffuse hypokinesis. Doppler parameters are consistent with abnormal left ventricular relaxation (grade 1 diastolic dysfunction). The E/e&' ratio is >15, suggesting elevated LV filling pressure. - Aortic valve: Calcified with moderate stenosis. Mean gradient (S): 30 mm Hg. Max gradient (S): 47 mmHg. Valve area (VTI): 1 cm^2. Valve area (Vmean): 1.08 cm^2. - Mitral valve: Calcified annulus. Mildly thickened leaflets . There was mild regurgitation. - Left atrium: The atrium was normal in size. - Inferior vena cava: The vessel was normal in size.  The respirophasic diameter changes were in the normal range (>= 50%), consistent with normal central venous pressure.  Impressions:  - Compared to a prior study in 12/2017, the LVEF is lower at 40-45% with global hypokinesis, elevated LV filling pressure and at least moderate aortic stenosis - mean gradient of 30 mmHg (consider low flow, low gradient severe AS).  Cardiac cath 03/25/18:  Prox LAD lesion is 40% stenosed.  Ost Cx to Prox Cx lesion is 65% stenosed.  Ost RCA to Mid RCA lesion is 100% stenosed.  Ramus lesion is 40% stenosed.  LV end diastolic pressure is normal.  There is moderate aortic valve stenosis.   1. Single vessel occlusive CAD with 100% ostial RCA. Left to right collaterals to the RCA. Moderate stenosis at the ostium of the LCx. These findings are unchanged from September 2019.  2. Normal LV filling pressures 3. Normal right heart pressures 4. Moderate aortic stenosis. Mean gradient 34 mm Hg. AVA 1.1 cm squared with an index of 0.56.  5. Normal cardiac output. 7.88 L/min with index 3.92.  Patient Profile     68 y.o. male with PMH of ESRD on HD (MWF), AL amyloid (kidney involvement), aortic stenosis, HTN, HLD, GERD, anemia, chronic combined CHF and CAD presented with SOB. He was treated for CHF and elevated trop. Cardiac cath 03/25/18 with stable CAD. Moderate AS by cath with normal cardiac output.   Assessment & Plan    1. CAD/Elevated troponin: CAD stable by cath. Continue medical management with ASA, Plavix, beta blocker and statin.  2. Moderate aortic stenosis: Will follow.   No further cardiac workup at this time.    [facility] will sign off.   Medication Recommendations:  No changes. Continue home medications Other recommendations (labs, testing, etc):  None Follow up as an outpatient:  We will arrange outpatient follow up in our office  For questions or updates, please contact [place] Please consult [email or web address] for  contact info under        Signed, Lauree Chandler, MD  03/26/2018, 10:31 AM

## 2018-03-26 NOTE — Discharge Summary (Signed)
[facility] Discharge Summary  Patient name: Jose Garrett Medical record number: [id number] Date of birth: Jun 06, 1950 Age: 68 y.o. Gender: male Date of Admission: 03/23/2018  Date of Discharge: 03/26/2018 Admitting Physician: Kinnie Feil, MD  Primary Care Provider: [name], MD Consultants: cardiology, nephrology  Indication for Hospitalization: chest pain, DOE  Discharge Diagnoses/Problem List:  NSTEMI  Disposition: discharge home with cardiology f/u  Discharge Condition: improved, stable  Discharge Exam:  General: reading in bed at dialysis, NAD, pleasant, cooperative Cardiovascular: RRR, no murmur appreciated Respiratory: CTAB, no wheezing or increased WOB Abdomen: soft, non-tender, non-distended Extremities: lower extremities no edema bilaterally. No rashes. Right arm access normal- no bleeding.  Brief Hospital Course:  NSTEMI- Patient admitted with worsening DOE and chest pain. On admission, EKG showed ischemic changes but no ST elevation. Troponins were elevated and trended as high as 5.92 and remained elevated throughout admission but decreased to 3.92 on day of discharge. Patient was placed on heparin drip during admission. Echo showed decreased LV function from previous in December with eEF 40-45% and global hypokinesis. Cardiology was consulted and performed heart catheterization. Imaging revealed single vessel occlusive CAD with 100% ostial RCA, left to right collaterals to the RCA. Moderate stenosis at the ostium of the LCx. These findings are unchanged from September 2019. No interventions were initiated. No further cardiac workup was indicated at this time per cardiology. Patient will continue to follow up with cardiology for ongoing CAD management and moderate aortic stenosis.  Patient was cleared for discharge being asymptomatic with stable vital signs and on medical management of ASA, plavix, BB, statin.  Thrombocytopenia.  Platelet count appears to be chronically low and decreased to as low as 77 during admission. On day of discharge, platelet count was improved to 82. Immature platelet fraction was normal and he had no signs of active bleeding throughout admission.   ESRD. Patient completed dialysis inpatient on regular MWF schedule. He was able to tolerate the whole treatment duration vs his last treatment prior to admission which was cut short and may have contributed to his presenting symptoms.  Issues for Follow Up:  1. F/u cardiolgy for CAD and moderate aortic stenosis 2. F/u thrombocytopenia (chronic) 82 on day of discharge.  3. F/u peripheral neuropathy which patient states is poorly controlled gabapentin alone so venlafaxine 37.5 was initiated on day of discharge. As this issue is why patient was unable to tolerate full treatments prior to admission and likely contributed to the presentation, would highly recommend continued management to improve neuropathy.  Significant Procedures: echo, left heart cath (without intervention), dialysis  Significant Labs and Imaging:  Recent Labs  Lab 03/24/18 0756 03/25/18 0715 03/26/18 0345  WBC 5.8 6.4 6.2  HGB 8.4* 8.6* 8.7*  HCT 26.3* 26.9* 26.4*  PLT 87* 77* 82*   Recent Labs  Lab 03/23/18 1620 03/24/18 0214 03/26/18 0756  NA 139 138 133*  K 3.9 4.2 5.5*  CL 96* 97* 94*  CO2 27 28 24   GLUCOSE 108* 106* 91  BUN 49* 56* 60*  CREATININE 10.20* 11.10* 9.90*  CALCIUM 9.6 9.2 9.6  MG 1.9  --   --   PHOS  --  5.4* 4.6  ALKPHOS 44  --   --   AST 22  --   --   ALT 18  --   --   ALBUMIN 3.8 3.6 3.7   Troponins: >>>>>5.93>>>>2.93 at discharge   Results/Tests Pending at Time of Discharge: none  Discharge Medications:  Allergies as of 03/26/2018      Reactions   No Known Allergies       Medication List    STOP taking these medications   LORazepam 1 MG tablet Commonly known as:  ATIVAN     TAKE these medications   acetaminophen 325 MG  tablet Commonly known as:  TYLENOL Take 325-650 mg by mouth every 6 (six) hours as needed (back pain).   aspirin EC 81 MG tablet Take 1 tablet (81 mg total) by mouth daily. What changed:    when to take this  additional instructions   atorvastatin 80 MG tablet Commonly known as:  LIPITOR Take 80 mg by mouth daily with supper.   carvedilol 3.125 MG tablet Commonly known as:  COREG Take 1 tablet by mouth twice a day on NON DIALYSIS DAYS ONLY   clopidogrel 75 MG tablet Commonly known as:  PLAVIX Take 1 tablet (75 mg total) by mouth daily.   Cyanocobalamin 2500 MCG Tabs Take 2,500 mcg by mouth daily.   cyclobenzaprine 5 MG tablet Commonly known as:  FLEXERIL Take every 8 hours as needed for muscle spasms What changed:    how much to take  how to take this  when to take this  additional instructions   DIALYVITE 800 0.8 MG Tabs Take 0.8 mg by mouth daily.   ethyl chloride spray Apply 1 application topically See admin instructions. Spray on arm immediately prior to dialysis on Monday, Wednesday and Friday   gabapentin 300 MG capsule Commonly known as:  NEURONTIN TAKE 1 CAPSULE BY MOUTH EVERY MORNING AND 3 CAPSULES EACH NIGHT AT BEDTIME What changed:    how much to take  how to take this  when to take this  additional instructions   oxyCODONE 10 mg 12 hr tablet Commonly known as:  OXYCONTIN Take 10 mg by mouth daily as needed (severe pain). What changed:  Another medication with the same name was changed. Make sure you understand how and when to take each.   oxyCODONE 5 MG immediate release tablet Commonly known as:  Oxy IR/ROXICODONE Take 1-2 tablets (5-10 mg total) by mouth every 6 (six) hours as needed for severe pain. What changed:    how much to take  when to take this  reasons to take this   sucroferric oxyhydroxide 500 MG chewable tablet Commonly known as:  VELPHORO Chew 500-1,000 mg by mouth See admin instructions. Mix 2 tablets (1000 mg)  in applesauce and take with full size meal, mix 1 - 1/2 tablets (500 - 750 mg) in applesauce for  light meals.   venlafaxine XR 37.5 MG 24 hr capsule Commonly known as:  EFFEXOR XR Take 1 capsule (37.5 mg total) by mouth daily for 30 days.       Discharge Instructions: Please refer to Patient Instructions section of EMR for full details.  Patient was counseled important signs and symptoms that should prompt return to medical care, changes in medications, dietary instructions, activity restrictions, and follow up appointments.   Follow-Up Appointments:   Richarda Osmond, DO 03/26/2018, 12:25 PM PGY-1, [place]

## 2018-03-26 NOTE — Progress Notes (Signed)
Family Medicine Teaching Service Daily Progress Note Intern Pager: [phone]  Patient name: Jose Garrett Medical record number: [id number] Date of birth: Nov 12, 1950 Age: 68 y.o. Gender: male  Primary Care Provider: [name], MD Consultants: cards, nephro Code Status: full  Pt Overview and Major Events to Date:  1/12 admitted  Assessment and Plan: Jose Garrett is a 68 y.o. male presenting with increasing dyspnea on exertion and chest tightness x 4 days . PMH is significant for  ESRD on HD (MWF), CHF, CAD, amyloidosis, HTN, HLD, Anxiety.   NSTEMI- H/o CAD, NSTEMI 11/2017- patient received cath imagining without intervention yesterday. 10 hours after procedure was completed heparin drip was restarted. Patient on ASA 81 as well. troponins trended overnight 3.78>3.92. I will follow up with cardiology recommendations today for how to proceed. He will receive dialysis today. - continuous cardiac telemetry - cont home ASA, Plavix, Lipitor - discontinued carafate yesterday - cont to monitor for chest pain - avoid QTc prolonging medications - continue heparin drip pending cards recs and/or interventions - daily weights, strict I/Os - nitro PRN  Chronic Combined CHF- BNP 362.8 on admission with CXR with b/l pulmonary edema. Weight since admission 85kg>82.5kg>83kg>82.7kg yesterday - strict I/O's - daily weights - diet with fluid restriction  ESRD on HD MWF- HD scheduled today. - f/u nephro for dialysis - renal panel am - cont home velphoro, rena-vit  Normocytic Anemia- Likely 2/2 ESRD.  Hgb stable at 8.7 today, baseline appears ~9.  Takes Vitamin B12 at home. - cont home Vit B12 - CBC am  Thrombocytopenia- chronic. Platelet count increased to 82 today. No signs of active bleeding but will monitor closely given patient is also on heparin drip currently. Immature platelet fraction yesterday normal - CBC am  Neuropathy: Chronic BLE, poorly controlled and worse with  dialysis treatments-Takes gabapentin 354m TID at home. States that this does help. To improve symptoms could consider starting amitriptyline or venlafaxine. Pharmacy will discuss options with patient.   - cont home gabapentin - discuss with nephrology on treatment dose/timing for improvement of symptoms - f/u pharmacy on pain management medication changes  Chronic Back Pain Takes flexeril 56mBID PRN at home.  Also takes Oxy IR and Oxycontin prn, which he states is rarely. - cont home flexeril - hold home oxy IR and oxycontin  AL Amyloidosis Patient with negative BM bx for multiple myeloma.  Follows with Oncology, not currently on treatment.  Last seen in 09/2017. - no intervention needed at this time  HTN- BP well controlled without medications, as has hypotension with HD.  Nephrology managing with HD. BP this morning 157/59 and decreased once at dialysis. - HD on MWF  HLD- On Lipitor at home.  Last Lipid Panel 02/12/18.  Total cholesterol 117, LDL 50, Tgs 174, HDL 32. - cont home lipitor  FEN/GI: Renal with fluid restriciton Prophylaxis: discontinued Hep SubQ, currently on heparin drip  Disposition: following for cardiac ischemia workup and nephrology tx  Subjective:  Patient is in dialysis currently and tolerating well. Will check back with patient after treatment  Objective: Temp:  [97.9 F (36.6 C)-98.7 F (37.1 C)] 98.7 F (37.1 C) (01/15 0520) Pulse Rate:  [72-94] 78 (01/15 0520) Resp:  [11-18] 18 (01/15 0520) BP: (124-169)/(59-88) 145/63 (01/15 0520) SpO2:  [95 %-100 %] 95 % (01/15 0520) Weight:  [82.7 kg] 82.7 kg (01/15 0100)   Physical Exam: General: NAD, pleasant, cooperative Cardiovascular: RRR, no murmur appreciated Respiratory: CTAB, no wheezing or increased WOB  Abdomen: soft, non-tender, non-distended Extremities: lower extremities no edema bilaterally. No rashes. Right arm access normal- no bleeding.  Laboratory: Recent Labs  Lab 03/24/18 0756  03/25/18 0715 03/26/18 0345  WBC 5.8 6.4 6.2  HGB 8.4* 8.6* 8.7*  HCT 26.3* 26.9* 26.4*  PLT 87* 77* 82*   Recent Labs  Lab 03/23/18 1620 03/24/18 0214  NA 139 138  K 3.9 4.2  CL 96* 97*  CO2 27 28  BUN 49* 56*  CREATININE 10.20* 11.10*  CALCIUM 9.6 9.2  PROT 7.2  --   BILITOT 0.5  --   ALKPHOS 44  --   ALT 18  --   AST 22  --   GLUCOSE 108* 106*   Hepatitis B surface Ag- negative troponins 0.1>0.6>2.56>3.84>5.13>5.93>5.02>4.51>3.79>3.92 hgb A1c 6.2 TSH pending  Imaging/Diagnostic Tests: Dg Chest 2 View  Result Date: 03/23/2018 CLINICAL DATA:  Acute onset of shortness of breath 4 days ago, worse with exertion. Dialysis treatment 2 days ago was shortened due to neuropathic pain. Current history of CHF. EXAM: CHEST - 2 VIEW COMPARISON:  11/27/2017 and earlier. FINDINGS: Cardiac silhouette mildly to moderately enlarged, unchanged. Thoracic aorta mildly atherosclerotic, unchanged. Hilar and mediastinal contours otherwise unremarkable. Minimal diffuse interstitial pulmonary edema as evidenced by Awanda Mink B lines. Lungs otherwise clear. No pleural effusions. No confluent airspace consolidation. Visualized bony thorax intact. IMPRESSION: Minimal acute CHF, with stable mild to moderate cardiomegaly and minimal diffuse interstitial pulmonary edema. Electronically Signed   By: Evangeline Dakin M.D.   On: 03/23/2018 17:26   Richarda Osmond, DO 03/26/2018, 7:02 AM PGY-1, [place] Intern pager: [phone], text pages welcome

## 2018-03-26 NOTE — Progress Notes (Signed)
Subjective:  Seen on HD here- no new c/o's- has been having issues toward end of treatment at OP - had heart cath yesterday- has CAD but nothing thaat was intervened on- rec medical therapy  Objective Vital signs in last 24 hours: Vitals:   03/26/18 0748 03/26/18 0800 03/26/18 0830 03/26/18 0900  BP: (!) 159/79 (!) 120/59 122/70 (!) 101/45  Pulse: 78 77 79 78  Resp:      Temp:      TempSrc:      SpO2:      Weight:      Height:       Weight change: -2.764 kg  Intake/Output Summary (Last 24 hours) at 03/26/2018 0920 Last data filed at 03/26/2018 0300 Gross per 24 hour  Intake 494.38 ml  Output -  Net 494.38 ml   Dialysis Orders: MWF at [facility] 4hr, 400/800, EDW 81kg, 2K/2Ca, UF#2, AVF, heparin 1500 bolus - Mircera 94mcg IV q 2 weeks (last 1/8)   Assessment/ Plan: Pt is a 68 y.o. yo male ESRD who was admitted on 03/23/2018 with NSTEMI - dyspnea/chest tightness Assessment/Plan: 1. Dyspnea/chest tightness- positive trop- s/p cardiac cath with CAD- 100 % to RCA but collaterals- medical management- ASA and plavix and statin- overall feels better  2. ESRD- normally MWF- via AVF- no issues here - on profile- having probs late tx as OP but had pulmonary edema here so not sure exactly where EDW should be  3. Anemia- hgb in the 8's- mircera last given 1/8 so not due - but mircera will need to be inc as OP  4. Secondary hyperparathyroidism- phos good on home dose velphoro- no vit d or sensipar as OP  5. HTN/volume- no  BP meds- trying to keep as dry as possible 6. Dispo- possibly home soon ?Louis Meckel    Labs: Basic Metabolic Panel: Recent Labs  Lab 03/23/18 1620 03/24/18 0214 03/26/18 0756  NA 139 138 133*  K 3.9 4.2 5.5*  CL 96* 97* 94*  CO2 27 28 24   GLUCOSE 108* 106* 91  BUN 49* 56* 60*  CREATININE 10.20* 11.10* 9.90*  CALCIUM 9.6 9.2 9.6  PHOS  --  5.4* 4.6   Liver Function Tests: Recent Labs  Lab 03/23/18 1620 03/24/18 0214 03/26/18 0756  AST 22   --   --   ALT 18  --   --   ALKPHOS 44  --   --   BILITOT 0.5  --   --   PROT 7.2  --   --   ALBUMIN 3.8 3.6 3.7   No results for input(s): LIPASE, AMYLASE in the last 168 hours. No results for input(s): AMMONIA in the last 168 hours. CBC: Recent Labs  Lab 03/23/18 1620 03/24/18 0756 03/25/18 0715 03/26/18 0345  WBC 6.0 5.8 6.4 6.2  NEUTROABS 3.9  --   --   --   HGB 8.9* 8.4* 8.6* 8.7*  HCT 28.1* 26.3* 26.9* 26.4*  MCV 96.2 96.3 96.8 95.7  PLT 97* 87* 77* 82*   Cardiac Enzymes: Recent Labs  Lab 03/25/18 0035 03/25/18 0715 03/25/18 1445 03/25/18 2124 03/26/18 0345  TROPONINI 5.02* 4.51* 4.39* 3.79* 3.92*   CBG: No results for input(s): GLUCAP in the last 168 hours.  Iron Studies: No results for input(s): IRON, TIBC, TRANSFERRIN, FERRITIN in the last 72 hours. Studies/Results: No results found. Medications: Infusions: . sodium chloride    . sodium chloride    . sodium chloride    .  heparin 1,200 Units/hr (03/26/18 0801)    Scheduled Medications: . aspirin EC  81 mg Oral Daily  . atorvastatin  80 mg Oral q1800  . Chlorhexidine Gluconate Cloth  6 each Topical Q0600  . clopidogrel  75 mg Oral Daily  . gabapentin  300 mg Oral TID  . heparin  1,500 Units Dialysis Once in dialysis  . multivitamin  1 tablet Oral QHS  . sodium chloride flush  3 mL Intravenous Q12H  . sucroferric oxyhydroxide  500-1,000 mg Oral TID WC  . vitamin B-12  2,500 mcg Oral Daily    have reviewed scheduled and prn medications.  Physical Exam: General: NAD Heart: RRR Lungs: mostly clear Abdomen: softl, non tender Extremities: no periph edema Dialysis Access: right AVF- accessed     03/26/2018,9:20 AM  LOS: 1 day

## 2018-03-28 DIAGNOSIS — D509 Iron deficiency anemia, unspecified: Secondary | ICD-10-CM | POA: Diagnosis not present

## 2018-03-28 DIAGNOSIS — N186 End stage renal disease: Secondary | ICD-10-CM | POA: Diagnosis not present

## 2018-03-28 DIAGNOSIS — N2581 Secondary hyperparathyroidism of renal origin: Secondary | ICD-10-CM | POA: Diagnosis not present

## 2018-03-28 DIAGNOSIS — D631 Anemia in chronic kidney disease: Secondary | ICD-10-CM | POA: Diagnosis not present

## 2018-03-31 DIAGNOSIS — N186 End stage renal disease: Secondary | ICD-10-CM | POA: Diagnosis not present

## 2018-03-31 DIAGNOSIS — D509 Iron deficiency anemia, unspecified: Secondary | ICD-10-CM | POA: Diagnosis not present

## 2018-03-31 DIAGNOSIS — N2581 Secondary hyperparathyroidism of renal origin: Secondary | ICD-10-CM | POA: Diagnosis not present

## 2018-03-31 DIAGNOSIS — D631 Anemia in chronic kidney disease: Secondary | ICD-10-CM | POA: Diagnosis not present

## 2018-04-01 ENCOUNTER — Encounter: Payer: Self-pay | Admitting: Podiatry

## 2018-04-01 ENCOUNTER — Ambulatory Visit (INDEPENDENT_AMBULATORY_CARE_PROVIDER_SITE_OTHER): Payer: Medicare Other | Admitting: Podiatry

## 2018-04-01 DIAGNOSIS — B351 Tinea unguium: Secondary | ICD-10-CM | POA: Diagnosis not present

## 2018-04-01 DIAGNOSIS — M79674 Pain in right toe(s): Secondary | ICD-10-CM | POA: Diagnosis not present

## 2018-04-01 DIAGNOSIS — G629 Polyneuropathy, unspecified: Secondary | ICD-10-CM | POA: Diagnosis not present

## 2018-04-01 DIAGNOSIS — M79675 Pain in left toe(s): Secondary | ICD-10-CM

## 2018-04-01 NOTE — Progress Notes (Signed)
Complaint:  Visit Type: Patient presents  to my office for  preventative foot care services. Complaint: Patient states" my nails have grown long and thick and become painful to walk and wear shoes" Patient has been diagnosed with  with neuropathy and is a dialysis patient.. The patient presents for preventative foot care services. No changes to ROS.  Patient has been under treatment for neuropathy with PT here.  Patient also says he has developed a heart attack since his last visit.  Podiatric Exam: Vascular: dorsalis pedis and posterior tibial pulses are palpable bilateral. Capillary return is immediate. Temperature gradient is WNL. Skin turgor WNL  Sensorium: Diminished  Semmes Weinstein monofilament test. Normal tactile sensation bilaterally. Nail Exam: Pt has thick disfigured discolored nails with subungual debris noted bilateral entire nail hallux through fifth toenails Ulcer Exam: There is no evidence of ulcer or pre-ulcerative changes or infection. Orthopedic Exam: Muscle tone and strength are WNL. No limitations in general ROM. No crepitus or effusions noted. Foot type and digits show no abnormalities. Bony prominences are unremarkable. Skin: No Porokeratosis. No infection or ulcers  Diagnosis:  Onychomycosis, , Pain in right toe, pain in left toes,  Neuropathy  Treatment & Plan Procedures and Treatment: Consent by patient was obtained for treatment procedures.   Debridement of mycotic and hypertrophic toenails, 1 through 5 bilateral and clearing of subungual debris. No ulceration, no infection noted.  Return Visit-Office Procedure: Patient instructed to return to the office for a follow up visit 3 months for continued evaluation and treatment.    Gardiner Barefoot DPM

## 2018-04-02 DIAGNOSIS — D631 Anemia in chronic kidney disease: Secondary | ICD-10-CM | POA: Diagnosis not present

## 2018-04-02 DIAGNOSIS — N186 End stage renal disease: Secondary | ICD-10-CM | POA: Diagnosis not present

## 2018-04-02 DIAGNOSIS — N2581 Secondary hyperparathyroidism of renal origin: Secondary | ICD-10-CM | POA: Diagnosis not present

## 2018-04-02 DIAGNOSIS — D509 Iron deficiency anemia, unspecified: Secondary | ICD-10-CM | POA: Diagnosis not present

## 2018-04-04 DIAGNOSIS — D509 Iron deficiency anemia, unspecified: Secondary | ICD-10-CM | POA: Diagnosis not present

## 2018-04-04 DIAGNOSIS — N2581 Secondary hyperparathyroidism of renal origin: Secondary | ICD-10-CM | POA: Diagnosis not present

## 2018-04-04 DIAGNOSIS — N186 End stage renal disease: Secondary | ICD-10-CM | POA: Diagnosis not present

## 2018-04-04 DIAGNOSIS — D631 Anemia in chronic kidney disease: Secondary | ICD-10-CM | POA: Diagnosis not present

## 2018-04-07 DIAGNOSIS — N186 End stage renal disease: Secondary | ICD-10-CM | POA: Diagnosis not present

## 2018-04-07 DIAGNOSIS — D631 Anemia in chronic kidney disease: Secondary | ICD-10-CM | POA: Diagnosis not present

## 2018-04-07 DIAGNOSIS — D509 Iron deficiency anemia, unspecified: Secondary | ICD-10-CM | POA: Diagnosis not present

## 2018-04-07 DIAGNOSIS — N2581 Secondary hyperparathyroidism of renal origin: Secondary | ICD-10-CM | POA: Diagnosis not present

## 2018-04-09 DIAGNOSIS — D631 Anemia in chronic kidney disease: Secondary | ICD-10-CM | POA: Diagnosis not present

## 2018-04-09 DIAGNOSIS — N2581 Secondary hyperparathyroidism of renal origin: Secondary | ICD-10-CM | POA: Diagnosis not present

## 2018-04-09 DIAGNOSIS — D509 Iron deficiency anemia, unspecified: Secondary | ICD-10-CM | POA: Diagnosis not present

## 2018-04-09 DIAGNOSIS — N186 End stage renal disease: Secondary | ICD-10-CM | POA: Diagnosis not present

## 2018-04-11 DIAGNOSIS — D631 Anemia in chronic kidney disease: Secondary | ICD-10-CM | POA: Diagnosis not present

## 2018-04-11 DIAGNOSIS — N2581 Secondary hyperparathyroidism of renal origin: Secondary | ICD-10-CM | POA: Diagnosis not present

## 2018-04-11 DIAGNOSIS — D509 Iron deficiency anemia, unspecified: Secondary | ICD-10-CM | POA: Diagnosis not present

## 2018-04-11 DIAGNOSIS — N186 End stage renal disease: Secondary | ICD-10-CM | POA: Diagnosis not present

## 2018-04-12 DIAGNOSIS — N186 End stage renal disease: Secondary | ICD-10-CM | POA: Diagnosis not present

## 2018-04-12 DIAGNOSIS — Z992 Dependence on renal dialysis: Secondary | ICD-10-CM | POA: Diagnosis not present

## 2018-04-12 DIAGNOSIS — I129 Hypertensive chronic kidney disease with stage 1 through stage 4 chronic kidney disease, or unspecified chronic kidney disease: Secondary | ICD-10-CM | POA: Diagnosis not present

## 2018-04-14 DIAGNOSIS — N186 End stage renal disease: Secondary | ICD-10-CM | POA: Diagnosis not present

## 2018-04-14 DIAGNOSIS — Z23 Encounter for immunization: Secondary | ICD-10-CM | POA: Diagnosis not present

## 2018-04-14 DIAGNOSIS — N2581 Secondary hyperparathyroidism of renal origin: Secondary | ICD-10-CM | POA: Diagnosis not present

## 2018-04-14 DIAGNOSIS — D509 Iron deficiency anemia, unspecified: Secondary | ICD-10-CM | POA: Diagnosis not present

## 2018-04-14 DIAGNOSIS — D631 Anemia in chronic kidney disease: Secondary | ICD-10-CM | POA: Diagnosis not present

## 2018-04-16 DIAGNOSIS — N2581 Secondary hyperparathyroidism of renal origin: Secondary | ICD-10-CM | POA: Diagnosis not present

## 2018-04-16 DIAGNOSIS — D631 Anemia in chronic kidney disease: Secondary | ICD-10-CM | POA: Diagnosis not present

## 2018-04-16 DIAGNOSIS — N186 End stage renal disease: Secondary | ICD-10-CM | POA: Diagnosis not present

## 2018-04-16 DIAGNOSIS — Z23 Encounter for immunization: Secondary | ICD-10-CM | POA: Diagnosis not present

## 2018-04-16 DIAGNOSIS — D509 Iron deficiency anemia, unspecified: Secondary | ICD-10-CM | POA: Diagnosis not present

## 2018-04-18 DIAGNOSIS — Z23 Encounter for immunization: Secondary | ICD-10-CM | POA: Diagnosis not present

## 2018-04-18 DIAGNOSIS — N186 End stage renal disease: Secondary | ICD-10-CM | POA: Diagnosis not present

## 2018-04-18 DIAGNOSIS — D631 Anemia in chronic kidney disease: Secondary | ICD-10-CM | POA: Diagnosis not present

## 2018-04-18 DIAGNOSIS — D509 Iron deficiency anemia, unspecified: Secondary | ICD-10-CM | POA: Diagnosis not present

## 2018-04-18 DIAGNOSIS — N2581 Secondary hyperparathyroidism of renal origin: Secondary | ICD-10-CM | POA: Diagnosis not present

## 2018-04-21 DIAGNOSIS — Z23 Encounter for immunization: Secondary | ICD-10-CM | POA: Diagnosis not present

## 2018-04-21 DIAGNOSIS — N186 End stage renal disease: Secondary | ICD-10-CM | POA: Diagnosis not present

## 2018-04-21 DIAGNOSIS — D631 Anemia in chronic kidney disease: Secondary | ICD-10-CM | POA: Diagnosis not present

## 2018-04-21 DIAGNOSIS — N2581 Secondary hyperparathyroidism of renal origin: Secondary | ICD-10-CM | POA: Diagnosis not present

## 2018-04-21 DIAGNOSIS — D509 Iron deficiency anemia, unspecified: Secondary | ICD-10-CM | POA: Diagnosis not present

## 2018-04-23 DIAGNOSIS — D631 Anemia in chronic kidney disease: Secondary | ICD-10-CM | POA: Diagnosis not present

## 2018-04-23 DIAGNOSIS — N186 End stage renal disease: Secondary | ICD-10-CM | POA: Diagnosis not present

## 2018-04-23 DIAGNOSIS — N2581 Secondary hyperparathyroidism of renal origin: Secondary | ICD-10-CM | POA: Diagnosis not present

## 2018-04-23 DIAGNOSIS — D509 Iron deficiency anemia, unspecified: Secondary | ICD-10-CM | POA: Diagnosis not present

## 2018-04-23 DIAGNOSIS — Z23 Encounter for immunization: Secondary | ICD-10-CM | POA: Diagnosis not present

## 2018-04-24 ENCOUNTER — Encounter: Payer: Self-pay | Admitting: Cardiovascular Disease

## 2018-04-24 ENCOUNTER — Ambulatory Visit (INDEPENDENT_AMBULATORY_CARE_PROVIDER_SITE_OTHER): Payer: Medicare Other | Admitting: Cardiovascular Disease

## 2018-04-24 VITALS — BP 118/56 | HR 86 | Ht 70.0 in | Wt 186.0 lb

## 2018-04-24 DIAGNOSIS — I5042 Chronic combined systolic (congestive) and diastolic (congestive) heart failure: Secondary | ICD-10-CM

## 2018-04-24 DIAGNOSIS — I10 Essential (primary) hypertension: Secondary | ICD-10-CM | POA: Diagnosis not present

## 2018-04-24 DIAGNOSIS — I6523 Occlusion and stenosis of bilateral carotid arteries: Secondary | ICD-10-CM | POA: Diagnosis not present

## 2018-04-24 DIAGNOSIS — E7800 Pure hypercholesterolemia, unspecified: Secondary | ICD-10-CM

## 2018-04-24 DIAGNOSIS — I2510 Atherosclerotic heart disease of native coronary artery without angina pectoris: Secondary | ICD-10-CM

## 2018-04-24 DIAGNOSIS — I350 Nonrheumatic aortic (valve) stenosis: Secondary | ICD-10-CM

## 2018-04-24 NOTE — Patient Instructions (Signed)
Medication Instructions:  Your physician recommends that you continue on your current medications as directed. Please refer to the Current Medication list given to you today.  If you need a refill on your cardiac medications before your next appointment, please call your pharmacy.   Lab work: none If you have labs (blood work) drawn today and your tests are completely normal, you will receive your results only by: [name] MyChart Message (if you have MyChart) OR . A paper copy in the mail If you have any lab test that is abnormal or we need to change your treatment, we will call you to review the results.  Testing/Procedures: none  Follow-Up: You are scheduled to see Dr. Angelena Form on April 23,2020 at 8:40

## 2018-04-24 NOTE — Progress Notes (Signed)
Chief Complaint  Patient presents with  . Follow-up    CAD   History of Present Illness: 68 yo male with history of ESRD on HD, AL amyloid (kidney involvement), aortic stenosis, HTN, HLD, GERD, anemia, chronic combined CHF and CAD here today for cardiac follow up. He was admitted to [facility] September 2019 with acute respiratory failure in setting of hypertensive urgency. His troponin was elevated and his EKG showed diffuse ST depression. Cardiac cath 11/25/17 with occlusion of the RCA which was felt to be the culprit but this vessel could not be engaged. The distal RCA filled from collaterals. There was moderate disease in the ostial Circumflex and mild disease in the LAD. Medical therapy was recommended. LVEF=35-40%. There was moderate aortic stenosis. Echo on 12/19/17 with LVEF=65-70%. Moderate AS (mean gradient 31 mmHg). He has been off of Coreg and Ace-inh due to hypotension in dialysis. His amyloid is followed by oncology whose notes indicate prior tx with CyBorD, Velcade, Ixazomib andDexamethasone. Bone marrow with no overt evidence of multiple myeloma. PET/CT scan showed no evidence of bony lesions or lymphadenopathy. He had neuropathy with some of his treatment measures so is no longer on chemotherapy.Last oncology note indicates serologic remission. Admitted to [facility] January 2020 with dyspnea. Cardiac cath 1/14/20w with stable CAD. Moderate AS by cath.   He is here today for follow up. The patient denies any chest pain, palpitations, lower extremity edema, orthopnea, PND, dizziness, near syncope or syncope. He has ongoing fatigue. He has dyspnea with moderate exertion. His BP still drops entering the third hour of dialysis. He is off of all BP lowering therapy.   Primary Care Physician: [name], MD  Past Medical History:  Diagnosis Date  . Amyloidosis ([place])   . Anemia Dx 2016  . CAD (coronary artery disease)    a. cardiac cath on 11/25/17 with occlusion of the RCA which could  have been his event but the RCA could not be engaged. The RCA filled distally from left to right collaterals. There were no severe stenoses in the left coronary system. Medical therapy recommended, started on plavix.  . Chronic combined systolic and diastolic CHF (congestive heart failure) ([place])    a. EF dropped to 35-40% with grade 2 DD by echo 11/2017.  [name] Diarrhea   . ESRD (end stage renal disease) ([place])   . Family history of adverse reaction to anesthesia    "my daughter can't take certain anesthesia agents" (09/08/2014)  . GERD (gastroesophageal reflux disease)   . History of blood transfusion 09/08/2014   "got hematoma after renal biopsy & HgB dropped"  . Hyperlipidemia Dx 2012  . Hypertension Dx 2012  . Restless legs     Past Surgical History:  Procedure Laterality Date  . AV FISTULA PLACEMENT Right 12/27/2015   Procedure: Right Arm ARTERIOVENOUS (AV) FISTULA CREATION;  Surgeon: Angelia Mould, MD;  Location: [place];  Service: Vascular;  Laterality: Right;  . COLONOSCOPY    . IR GENERIC HISTORICAL  01/27/2016   IR FLUORO GUIDE CV LINE RIGHT 01/27/2016 Arne Cleveland, MD [facility]-INTERV RAD  . IR GENERIC HISTORICAL  01/27/2016   IR US GUIDE VASC ACCESS RIGHT 01/27/2016 Arne Cleveland, MD [facility]INTER[facility]  . LAPAROSCOPIC CHOLECYSTECTOMY  03/2011  . LEFT HEART CATH AND CORONARY ANGIOGRAPHY N/A 11/25/2017   Procedure: LEFT HEART CATH AND CORONARY ANGIOGRAPHY;  Surgeon: Martinique, Peter M, MD;  Location: [place] CV LAB;  Service: Cardiovascular;  Laterality: N/A;  . RENAL BIOPSY, PERCUTANEOUS Right  09/08/2014  . RIGHT/LEFT HEART CATH AND CORONARY ANGIOGRAPHY N/A 03/25/2018   Procedure: RIGHT/LEFT HEART CATH AND CORONARY ANGIOGRAPHY;  Surgeon: Martinique, Peter M, MD;  Location: [place] CV LAB;  Service: Cardiovascular;  Laterality: N/A;  . TONSILLECTOMY  ~ 1960  . ULTRASOUND GUIDANCE FOR VASCULAR ACCESS  03/25/2018   Procedure: Ultrasound Guidance For Vascular Access;  Surgeon: Martinique, Peter  M, MD;  Location: [place] CV LAB;  Service: Cardiovascular;;    Current Outpatient Medications  Medication Sig Dispense Refill  . acetaminophen (TYLENOL) 325 MG tablet Take 325-650 mg by mouth every 6 (six) hours as needed (back pain).    [name] aspirin EC 81 MG tablet Take 1 tablet (81 mg total) by mouth daily. 30 tablet 11  . atorvastatin (LIPITOR) 80 MG tablet Take 80 mg by mouth daily with supper.     . B Complex-C-Folic Acid (DIALYVITE 009) 0.8 MG TABS Take 0.8 mg by mouth daily.    . clopidogrel (PLAVIX) 75 MG tablet Take 1 tablet (75 mg total) by mouth daily. 90 tablet 3  . Cyanocobalamin 2500 MCG TABS Take 2,500 mcg by mouth daily.     . cyclobenzaprine (FLEXERIL) 5 MG tablet Take every 8 hours as needed for muscle spasms 30 tablet 11  . ethyl chloride spray Apply 1 application topically See admin instructions. Spray on arm immediately prior to dialysis on Monday, Wednesday and Friday    . gabapentin (NEURONTIN) 300 MG capsule TAKE 1 CAPSULE BY MOUTH EVERY MORNING AND 3 CAPSULES EACH NIGHT AT BEDTIME 120 capsule 11  . oxyCODONE (OXY IR/ROXICODONE) 5 MG immediate release tablet Take 1-2 tablets (5-10 mg total) by mouth every 6 (six) hours as needed for severe pain. 60 tablet 0  . oxyCODONE (OXYCONTIN) 10 mg 12 hr tablet Take 10 mg by mouth daily as needed (severe pain).     . sucroferric oxyhydroxide (VELPHORO) 500 MG chewable tablet Chew 500-1,000 mg by mouth See admin instructions. Mix 2 tablets (1000 mg) in applesauce and take with full size meal, mix 1 - 1/2 tablets (500 - 750 mg) in applesauce for  light meals.     No current facility-administered medications for this visit.     Allergies  Allergen Reactions  . No Known Allergies     Social History   Socioeconomic History  . Marital status: Divorced    Spouse name: Not on file  . Number of children: Not on file  . Years of education: Not on file  . Highest education level: Not on file  Occupational History  . Not on file   Social Needs  . Financial resource strain: Not on file  . Food insecurity:    Worry: Not on file    Inability: Not on file  . Transportation needs:    Medical: Not on file    Non-medical: Not on file  Tobacco Use  . Smoking status: Former Smoker    Packs/day: 2.00    Years: 40.00    Pack years: 80.00    Types: Cigarettes    Last attempt to quit: 03/19/2011    Years since quitting: 7.1  . Smokeless tobacco: Never Used  Substance and Sexual Activity  . Alcohol use: No    Alcohol/week: 0.0 standard drinks  . Drug use: No    Comment: 09/08/2014 "I have used marijuana till the 1990's". Notes that he quit 15 yrs ago  . Sexual activity: Not Currently  Lifestyle  . Physical activity:    Days  per week: Not on file    Minutes per session: Not on file  . Stress: Not on file  Relationships  . Social connections:    Talks on phone: Not on file    Gets together: Not on file    Attends religious service: Not on file    Active member of club or organization: Not on file    Attends meetings of clubs or organizations: Not on file    Relationship status: Not on file  . Intimate partner violence:    Fear of current or ex partner: Not on file    Emotionally abused: Not on file    Physically abused: Not on file    Forced sexual activity: Not on file  Other Topics Concern  . 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     Family History  Problem Relation Age of Onset  . Hypertension Mother   . Diabetes Mother   . Heart disease Mother   . Skin cancer Mother   . Hypertension Father   . Diabetes Father   . Diabetes Sister     Review of Systems:  As stated in the HPI and otherwise negative.   BP (!) 118/56   Pulse 86   Ht '5\' 10"'$  (1.778 m)   Wt 186 lb (84.4 kg)   SpO2 97%   BMI 26.69 kg/m   Physical Examination: General: Well developed,  well nourished, NAD  HEENT: OP clear, mucus membranes moist  SKIN: warm, dry. No rashes. Neuro: No focal deficits  Musculoskeletal: Muscle strength 5/5 all ext  Psychiatric: Mood and affect normal  Neck: No JVD, no carotid bruits, no thyromegaly, no lymphadenopathy.  Lungs:Clear bilaterally, no wheezes, rhonci, crackles Cardiovascular: Regular rate and rhythm. Loud systolic murmur.  Abdomen:Soft. Bowel sounds present. Non-tender.  Extremities: No lower extremity edema. Pulses are 2 + in the bilateral DP/PT.  Echo 12/19/17: Left ventricle: The cavity size was normal. There was moderate   concentric hypertrophy. Consider amyloid infiltration. Consider   PYP scan. Systolic function was vigorous. The estimated ejection   fraction was in the range of 65% to 70%. Wall motion was normal;   there were no regional wall motion abnormalities. Doppler   parameters are consistent with abnormal left ventricular   relaxation (grade 1 diastolic dysfunction). - Aortic valve: Functionally bicuspid; severely thickened, severely   calcified leaflets; fusion of the left-noncoronary commissure.   Valve mobility was restricted. There was moderate stenosis. There   was mild regurgitation. Peak velocity (S): 385 cm/s. Mean   gradient (S): 31 mm Hg. Valve area (VTI): 1.27 cm^2. Valve area   (Vmax): 1.07 cm^2. Valve area (Vmean): 1.08 cm^2. - Mitral valve: Calcified annulus. - Right ventricle: The cavity size was normal. Wall thickness was   mildly increased. - Pericardium, extracardiac: A trivial pericardial effusion was   identified.  Impressions:  - When compared to prior, EF has improved.  EKG:  EKG is not ordered today. The ekg ordered today demonstrates   Recent Labs: 03/23/2018: ALT 18; B Natriuretic Peptide 362.8; Magnesium 1.9 03/26/2018: BUN 60; Creatinine, Ser 9.90; Hemoglobin 8.7; Platelets 82; Potassium 5.5; Sodium 133; TSH 1.591   Lipid Panel    Component Value Date/Time   CHOL 117  02/12/2018 0823   TRIG 174 (H) 02/12/2018 0823   HDL 32 (  L) 02/12/2018 0823   CHOLHDL 3.7 02/12/2018 0823   CHOLHDL 7.4 02/25/2014 0959   VLDL 40 02/25/2014 0959   LDLCALC 50 02/12/2018 0823     Wt Readings from Last 3 Encounters:  04/24/18 186 lb (84.4 kg)  03/26/18 180 lb 12.4 oz (82 kg)  02/12/18 181 lb (82.1 kg)     Other studies Reviewed: Additional studies/ records that were reviewed today include:  Review of the above records demonstrates:    Assessment and Plan:   1. CAD without angina: Cardiac cath January 2020 with stable CAD, known chronic occlusion of the RCA. Will continue ASA, Plavix and statin. He is not on a beta blocker given hypotension on dialysis days.    2. Ischemic Cardiomyopathy/Chronic combined CHF: LVEF=45% on echo January 2020. Unable to tolerate beta blocker, Ace-inh or ARB due to hypotension on dialysis. Volume status is ok today.   3. Aortic stenosis: Moderate AS by echo January 2020.  Mean gradient 30 mmHg, peak gradient 47 mmHg, AVA 1.0 cm2, dimensionless index 0.29. Stroke volume/BSA 39. He continues to have hypotension on dialysis entering the third hour. I will review his valve findings at our TAVR meeting next week.   4. HTN: BP controlled. Followed in Nephrology  5. Carotid artery disease: Mild bilateral carotid artery disease by dopplers October 2019.   6. ESRD: on HD.   7. Hyperlipidemia: LDL at goal. Continue statin. .   Current medicines are reviewed at length with the patient today.  The patient does not have concerns regarding medicines.  The following changes have been made:  no change  Labs/ tests ordered today include:   No orders of the defined types were placed in this encounter.    Disposition:   FU with me in 6 months   Signed, Lauree Chandler, MD 04/24/2018 8:42 AM    [facility] [address], [place], [place]  [address] Phone: [phone]; Fax: [phone]

## 2018-04-25 DIAGNOSIS — D509 Iron deficiency anemia, unspecified: Secondary | ICD-10-CM | POA: Diagnosis not present

## 2018-04-25 DIAGNOSIS — D631 Anemia in chronic kidney disease: Secondary | ICD-10-CM | POA: Diagnosis not present

## 2018-04-25 DIAGNOSIS — N2581 Secondary hyperparathyroidism of renal origin: Secondary | ICD-10-CM | POA: Diagnosis not present

## 2018-04-25 DIAGNOSIS — Z23 Encounter for immunization: Secondary | ICD-10-CM | POA: Diagnosis not present

## 2018-04-25 DIAGNOSIS — N186 End stage renal disease: Secondary | ICD-10-CM | POA: Diagnosis not present

## 2018-04-28 DIAGNOSIS — N186 End stage renal disease: Secondary | ICD-10-CM | POA: Diagnosis not present

## 2018-04-28 DIAGNOSIS — N2581 Secondary hyperparathyroidism of renal origin: Secondary | ICD-10-CM | POA: Diagnosis not present

## 2018-04-28 DIAGNOSIS — D509 Iron deficiency anemia, unspecified: Secondary | ICD-10-CM | POA: Diagnosis not present

## 2018-04-28 DIAGNOSIS — Z23 Encounter for immunization: Secondary | ICD-10-CM | POA: Diagnosis not present

## 2018-04-28 DIAGNOSIS — D631 Anemia in chronic kidney disease: Secondary | ICD-10-CM | POA: Diagnosis not present

## 2018-04-30 DIAGNOSIS — N186 End stage renal disease: Secondary | ICD-10-CM | POA: Diagnosis not present

## 2018-04-30 DIAGNOSIS — Z23 Encounter for immunization: Secondary | ICD-10-CM | POA: Diagnosis not present

## 2018-04-30 DIAGNOSIS — N2581 Secondary hyperparathyroidism of renal origin: Secondary | ICD-10-CM | POA: Diagnosis not present

## 2018-04-30 DIAGNOSIS — D509 Iron deficiency anemia, unspecified: Secondary | ICD-10-CM | POA: Diagnosis not present

## 2018-04-30 DIAGNOSIS — D631 Anemia in chronic kidney disease: Secondary | ICD-10-CM | POA: Diagnosis not present

## 2018-05-02 DIAGNOSIS — N2581 Secondary hyperparathyroidism of renal origin: Secondary | ICD-10-CM | POA: Diagnosis not present

## 2018-05-02 DIAGNOSIS — Z23 Encounter for immunization: Secondary | ICD-10-CM | POA: Diagnosis not present

## 2018-05-02 DIAGNOSIS — D631 Anemia in chronic kidney disease: Secondary | ICD-10-CM | POA: Diagnosis not present

## 2018-05-02 DIAGNOSIS — N186 End stage renal disease: Secondary | ICD-10-CM | POA: Diagnosis not present

## 2018-05-02 DIAGNOSIS — D509 Iron deficiency anemia, unspecified: Secondary | ICD-10-CM | POA: Diagnosis not present

## 2018-05-05 DIAGNOSIS — N186 End stage renal disease: Secondary | ICD-10-CM | POA: Diagnosis not present

## 2018-05-05 DIAGNOSIS — D631 Anemia in chronic kidney disease: Secondary | ICD-10-CM | POA: Diagnosis not present

## 2018-05-05 DIAGNOSIS — N2581 Secondary hyperparathyroidism of renal origin: Secondary | ICD-10-CM | POA: Diagnosis not present

## 2018-05-05 DIAGNOSIS — Z23 Encounter for immunization: Secondary | ICD-10-CM | POA: Diagnosis not present

## 2018-05-05 DIAGNOSIS — D509 Iron deficiency anemia, unspecified: Secondary | ICD-10-CM | POA: Diagnosis not present

## 2018-05-07 DIAGNOSIS — N2581 Secondary hyperparathyroidism of renal origin: Secondary | ICD-10-CM | POA: Diagnosis not present

## 2018-05-07 DIAGNOSIS — Z23 Encounter for immunization: Secondary | ICD-10-CM | POA: Diagnosis not present

## 2018-05-07 DIAGNOSIS — N186 End stage renal disease: Secondary | ICD-10-CM | POA: Diagnosis not present

## 2018-05-07 DIAGNOSIS — D631 Anemia in chronic kidney disease: Secondary | ICD-10-CM | POA: Diagnosis not present

## 2018-05-07 DIAGNOSIS — D509 Iron deficiency anemia, unspecified: Secondary | ICD-10-CM | POA: Diagnosis not present

## 2018-05-09 DIAGNOSIS — D631 Anemia in chronic kidney disease: Secondary | ICD-10-CM | POA: Diagnosis not present

## 2018-05-09 DIAGNOSIS — D509 Iron deficiency anemia, unspecified: Secondary | ICD-10-CM | POA: Diagnosis not present

## 2018-05-09 DIAGNOSIS — N186 End stage renal disease: Secondary | ICD-10-CM | POA: Diagnosis not present

## 2018-05-09 DIAGNOSIS — Z23 Encounter for immunization: Secondary | ICD-10-CM | POA: Diagnosis not present

## 2018-05-09 DIAGNOSIS — N2581 Secondary hyperparathyroidism of renal origin: Secondary | ICD-10-CM | POA: Diagnosis not present

## 2018-05-11 DIAGNOSIS — I129 Hypertensive chronic kidney disease with stage 1 through stage 4 chronic kidney disease, or unspecified chronic kidney disease: Secondary | ICD-10-CM | POA: Diagnosis not present

## 2018-05-11 DIAGNOSIS — N186 End stage renal disease: Secondary | ICD-10-CM | POA: Diagnosis not present

## 2018-05-11 DIAGNOSIS — Z992 Dependence on renal dialysis: Secondary | ICD-10-CM | POA: Diagnosis not present

## 2018-05-12 DIAGNOSIS — D509 Iron deficiency anemia, unspecified: Secondary | ICD-10-CM | POA: Diagnosis not present

## 2018-05-12 DIAGNOSIS — N186 End stage renal disease: Secondary | ICD-10-CM | POA: Diagnosis not present

## 2018-05-12 DIAGNOSIS — N2581 Secondary hyperparathyroidism of renal origin: Secondary | ICD-10-CM | POA: Diagnosis not present

## 2018-05-14 DIAGNOSIS — D509 Iron deficiency anemia, unspecified: Secondary | ICD-10-CM | POA: Diagnosis not present

## 2018-05-14 DIAGNOSIS — N186 End stage renal disease: Secondary | ICD-10-CM | POA: Diagnosis not present

## 2018-05-14 DIAGNOSIS — N2581 Secondary hyperparathyroidism of renal origin: Secondary | ICD-10-CM | POA: Diagnosis not present

## 2018-05-16 DIAGNOSIS — N2581 Secondary hyperparathyroidism of renal origin: Secondary | ICD-10-CM | POA: Diagnosis not present

## 2018-05-16 DIAGNOSIS — D509 Iron deficiency anemia, unspecified: Secondary | ICD-10-CM | POA: Diagnosis not present

## 2018-05-16 DIAGNOSIS — N186 End stage renal disease: Secondary | ICD-10-CM | POA: Diagnosis not present

## 2018-05-19 DIAGNOSIS — N186 End stage renal disease: Secondary | ICD-10-CM | POA: Diagnosis not present

## 2018-05-19 DIAGNOSIS — D509 Iron deficiency anemia, unspecified: Secondary | ICD-10-CM | POA: Diagnosis not present

## 2018-05-19 DIAGNOSIS — N2581 Secondary hyperparathyroidism of renal origin: Secondary | ICD-10-CM | POA: Diagnosis not present

## 2018-05-21 DIAGNOSIS — N186 End stage renal disease: Secondary | ICD-10-CM | POA: Diagnosis not present

## 2018-05-21 DIAGNOSIS — N2581 Secondary hyperparathyroidism of renal origin: Secondary | ICD-10-CM | POA: Diagnosis not present

## 2018-05-21 DIAGNOSIS — D509 Iron deficiency anemia, unspecified: Secondary | ICD-10-CM | POA: Diagnosis not present

## 2018-05-23 DIAGNOSIS — N186 End stage renal disease: Secondary | ICD-10-CM | POA: Diagnosis not present

## 2018-05-23 DIAGNOSIS — N2581 Secondary hyperparathyroidism of renal origin: Secondary | ICD-10-CM | POA: Diagnosis not present

## 2018-05-23 DIAGNOSIS — D509 Iron deficiency anemia, unspecified: Secondary | ICD-10-CM | POA: Diagnosis not present

## 2018-05-26 DIAGNOSIS — D509 Iron deficiency anemia, unspecified: Secondary | ICD-10-CM | POA: Diagnosis not present

## 2018-05-26 DIAGNOSIS — N2581 Secondary hyperparathyroidism of renal origin: Secondary | ICD-10-CM | POA: Diagnosis not present

## 2018-05-26 DIAGNOSIS — N186 End stage renal disease: Secondary | ICD-10-CM | POA: Diagnosis not present

## 2018-05-28 DIAGNOSIS — N186 End stage renal disease: Secondary | ICD-10-CM | POA: Diagnosis not present

## 2018-05-28 DIAGNOSIS — N2581 Secondary hyperparathyroidism of renal origin: Secondary | ICD-10-CM | POA: Diagnosis not present

## 2018-05-28 DIAGNOSIS — D509 Iron deficiency anemia, unspecified: Secondary | ICD-10-CM | POA: Diagnosis not present

## 2018-05-30 DIAGNOSIS — D509 Iron deficiency anemia, unspecified: Secondary | ICD-10-CM | POA: Diagnosis not present

## 2018-05-30 DIAGNOSIS — N186 End stage renal disease: Secondary | ICD-10-CM | POA: Diagnosis not present

## 2018-05-30 DIAGNOSIS — N2581 Secondary hyperparathyroidism of renal origin: Secondary | ICD-10-CM | POA: Diagnosis not present

## 2018-06-02 DIAGNOSIS — D509 Iron deficiency anemia, unspecified: Secondary | ICD-10-CM | POA: Diagnosis not present

## 2018-06-02 DIAGNOSIS — N2581 Secondary hyperparathyroidism of renal origin: Secondary | ICD-10-CM | POA: Diagnosis not present

## 2018-06-02 DIAGNOSIS — N186 End stage renal disease: Secondary | ICD-10-CM | POA: Diagnosis not present

## 2018-06-04 DIAGNOSIS — N186 End stage renal disease: Secondary | ICD-10-CM | POA: Diagnosis not present

## 2018-06-04 DIAGNOSIS — N2581 Secondary hyperparathyroidism of renal origin: Secondary | ICD-10-CM | POA: Diagnosis not present

## 2018-06-04 DIAGNOSIS — D509 Iron deficiency anemia, unspecified: Secondary | ICD-10-CM | POA: Diagnosis not present

## 2018-06-06 DIAGNOSIS — N2581 Secondary hyperparathyroidism of renal origin: Secondary | ICD-10-CM | POA: Diagnosis not present

## 2018-06-06 DIAGNOSIS — D509 Iron deficiency anemia, unspecified: Secondary | ICD-10-CM | POA: Diagnosis not present

## 2018-06-06 DIAGNOSIS — N186 End stage renal disease: Secondary | ICD-10-CM | POA: Diagnosis not present

## 2018-06-09 DIAGNOSIS — D509 Iron deficiency anemia, unspecified: Secondary | ICD-10-CM | POA: Diagnosis not present

## 2018-06-09 DIAGNOSIS — N186 End stage renal disease: Secondary | ICD-10-CM | POA: Diagnosis not present

## 2018-06-09 DIAGNOSIS — N2581 Secondary hyperparathyroidism of renal origin: Secondary | ICD-10-CM | POA: Diagnosis not present

## 2018-06-11 DIAGNOSIS — Z992 Dependence on renal dialysis: Secondary | ICD-10-CM | POA: Diagnosis not present

## 2018-06-11 DIAGNOSIS — D509 Iron deficiency anemia, unspecified: Secondary | ICD-10-CM | POA: Diagnosis not present

## 2018-06-11 DIAGNOSIS — I129 Hypertensive chronic kidney disease with stage 1 through stage 4 chronic kidney disease, or unspecified chronic kidney disease: Secondary | ICD-10-CM | POA: Diagnosis not present

## 2018-06-11 DIAGNOSIS — D631 Anemia in chronic kidney disease: Secondary | ICD-10-CM | POA: Diagnosis not present

## 2018-06-11 DIAGNOSIS — N186 End stage renal disease: Secondary | ICD-10-CM | POA: Diagnosis not present

## 2018-06-11 DIAGNOSIS — N2581 Secondary hyperparathyroidism of renal origin: Secondary | ICD-10-CM | POA: Diagnosis not present

## 2018-06-13 DIAGNOSIS — N2581 Secondary hyperparathyroidism of renal origin: Secondary | ICD-10-CM | POA: Diagnosis not present

## 2018-06-13 DIAGNOSIS — N186 End stage renal disease: Secondary | ICD-10-CM | POA: Diagnosis not present

## 2018-06-13 DIAGNOSIS — D631 Anemia in chronic kidney disease: Secondary | ICD-10-CM | POA: Diagnosis not present

## 2018-06-13 DIAGNOSIS — D509 Iron deficiency anemia, unspecified: Secondary | ICD-10-CM | POA: Diagnosis not present

## 2018-06-16 DIAGNOSIS — D631 Anemia in chronic kidney disease: Secondary | ICD-10-CM | POA: Diagnosis not present

## 2018-06-16 DIAGNOSIS — D509 Iron deficiency anemia, unspecified: Secondary | ICD-10-CM | POA: Diagnosis not present

## 2018-06-16 DIAGNOSIS — N186 End stage renal disease: Secondary | ICD-10-CM | POA: Diagnosis not present

## 2018-06-16 DIAGNOSIS — N2581 Secondary hyperparathyroidism of renal origin: Secondary | ICD-10-CM | POA: Diagnosis not present

## 2018-06-18 DIAGNOSIS — D631 Anemia in chronic kidney disease: Secondary | ICD-10-CM | POA: Diagnosis not present

## 2018-06-18 DIAGNOSIS — N2581 Secondary hyperparathyroidism of renal origin: Secondary | ICD-10-CM | POA: Diagnosis not present

## 2018-06-18 DIAGNOSIS — D509 Iron deficiency anemia, unspecified: Secondary | ICD-10-CM | POA: Diagnosis not present

## 2018-06-18 DIAGNOSIS — N186 End stage renal disease: Secondary | ICD-10-CM | POA: Diagnosis not present

## 2018-06-20 DIAGNOSIS — N2581 Secondary hyperparathyroidism of renal origin: Secondary | ICD-10-CM | POA: Diagnosis not present

## 2018-06-20 DIAGNOSIS — D509 Iron deficiency anemia, unspecified: Secondary | ICD-10-CM | POA: Diagnosis not present

## 2018-06-20 DIAGNOSIS — N186 End stage renal disease: Secondary | ICD-10-CM | POA: Diagnosis not present

## 2018-06-20 DIAGNOSIS — D631 Anemia in chronic kidney disease: Secondary | ICD-10-CM | POA: Diagnosis not present

## 2018-06-23 DIAGNOSIS — N2581 Secondary hyperparathyroidism of renal origin: Secondary | ICD-10-CM | POA: Diagnosis not present

## 2018-06-23 DIAGNOSIS — D509 Iron deficiency anemia, unspecified: Secondary | ICD-10-CM | POA: Diagnosis not present

## 2018-06-23 DIAGNOSIS — D631 Anemia in chronic kidney disease: Secondary | ICD-10-CM | POA: Diagnosis not present

## 2018-06-23 DIAGNOSIS — N186 End stage renal disease: Secondary | ICD-10-CM | POA: Diagnosis not present

## 2018-06-25 DIAGNOSIS — N2581 Secondary hyperparathyroidism of renal origin: Secondary | ICD-10-CM | POA: Diagnosis not present

## 2018-06-25 DIAGNOSIS — N186 End stage renal disease: Secondary | ICD-10-CM | POA: Diagnosis not present

## 2018-06-25 DIAGNOSIS — D509 Iron deficiency anemia, unspecified: Secondary | ICD-10-CM | POA: Diagnosis not present

## 2018-06-25 DIAGNOSIS — D631 Anemia in chronic kidney disease: Secondary | ICD-10-CM | POA: Diagnosis not present

## 2018-06-27 DIAGNOSIS — D509 Iron deficiency anemia, unspecified: Secondary | ICD-10-CM | POA: Diagnosis not present

## 2018-06-27 DIAGNOSIS — N186 End stage renal disease: Secondary | ICD-10-CM | POA: Diagnosis not present

## 2018-06-27 DIAGNOSIS — D631 Anemia in chronic kidney disease: Secondary | ICD-10-CM | POA: Diagnosis not present

## 2018-06-27 DIAGNOSIS — N2581 Secondary hyperparathyroidism of renal origin: Secondary | ICD-10-CM | POA: Diagnosis not present

## 2018-06-30 DIAGNOSIS — N2581 Secondary hyperparathyroidism of renal origin: Secondary | ICD-10-CM | POA: Diagnosis not present

## 2018-06-30 DIAGNOSIS — D631 Anemia in chronic kidney disease: Secondary | ICD-10-CM | POA: Diagnosis not present

## 2018-06-30 DIAGNOSIS — N186 End stage renal disease: Secondary | ICD-10-CM | POA: Diagnosis not present

## 2018-06-30 DIAGNOSIS — D509 Iron deficiency anemia, unspecified: Secondary | ICD-10-CM | POA: Diagnosis not present

## 2018-07-01 ENCOUNTER — Ambulatory Visit: Payer: Medicare Other | Admitting: Podiatry

## 2018-07-01 DIAGNOSIS — I771 Stricture of artery: Secondary | ICD-10-CM | POA: Diagnosis not present

## 2018-07-01 DIAGNOSIS — Z992 Dependence on renal dialysis: Secondary | ICD-10-CM | POA: Diagnosis not present

## 2018-07-01 DIAGNOSIS — T82858A Stenosis of vascular prosthetic devices, implants and grafts, initial encounter: Secondary | ICD-10-CM | POA: Diagnosis not present

## 2018-07-01 DIAGNOSIS — N186 End stage renal disease: Secondary | ICD-10-CM | POA: Diagnosis not present

## 2018-07-02 ENCOUNTER — Telehealth: Payer: Self-pay | Admitting: Cardiovascular Disease

## 2018-07-02 DIAGNOSIS — D509 Iron deficiency anemia, unspecified: Secondary | ICD-10-CM | POA: Diagnosis not present

## 2018-07-02 DIAGNOSIS — N2581 Secondary hyperparathyroidism of renal origin: Secondary | ICD-10-CM | POA: Diagnosis not present

## 2018-07-02 DIAGNOSIS — N186 End stage renal disease: Secondary | ICD-10-CM | POA: Diagnosis not present

## 2018-07-02 DIAGNOSIS — D631 Anemia in chronic kidney disease: Secondary | ICD-10-CM | POA: Diagnosis not present

## 2018-07-02 NOTE — Telephone Encounter (Signed)
° ° °  VIDEO/Doximity visit on 07/03/2018    Consent confirmed via MyChart   -  07/02/2018

## 2018-07-03 ENCOUNTER — Other Ambulatory Visit: Payer: Self-pay

## 2018-07-03 ENCOUNTER — Encounter: Payer: Self-pay | Admitting: Cardiovascular Disease

## 2018-07-03 ENCOUNTER — Telehealth (INDEPENDENT_AMBULATORY_CARE_PROVIDER_SITE_OTHER): Payer: Medicare Other | Admitting: Cardiovascular Disease

## 2018-07-03 VITALS — BP 120/80 | Ht 70.0 in | Wt 191.0 lb

## 2018-07-03 DIAGNOSIS — I2510 Atherosclerotic heart disease of native coronary artery without angina pectoris: Secondary | ICD-10-CM

## 2018-07-03 DIAGNOSIS — I10 Essential (primary) hypertension: Secondary | ICD-10-CM

## 2018-07-03 DIAGNOSIS — I5042 Chronic combined systolic (congestive) and diastolic (congestive) heart failure: Secondary | ICD-10-CM | POA: Diagnosis not present

## 2018-07-03 DIAGNOSIS — I6523 Occlusion and stenosis of bilateral carotid arteries: Secondary | ICD-10-CM

## 2018-07-03 DIAGNOSIS — I350 Nonrheumatic aortic (valve) stenosis: Secondary | ICD-10-CM

## 2018-07-03 DIAGNOSIS — E7800 Pure hypercholesterolemia, unspecified: Secondary | ICD-10-CM

## 2018-07-03 NOTE — Progress Notes (Signed)
Virtual Visit via Video Note   This visit type was conducted due to national recommendations for restrictions regarding the COVID-19 Pandemic (e.g. social distancing) in an effort to limit this patient's exposure and mitigate transmission in our community.  Due to his co-morbid illnesses, this patient is at least at moderate risk for complications without adequate follow up.  This format is felt to be most appropriate for this patient at this time.  All issues noted in this document were discussed and addressed.  A limited physical exam was performed with this format.  Please refer to the patient's chart for his consent to telehealth for [facility].   Evaluation Performed:  Follow-up visit  Date:  07/03/2018   ID:  Jose Garrett, DOB 12/13/1950, MRN [id number]  Patient Location: Home Provider Location: Home  PCP:  Patient, No Pcp Per  Cardiologist:  Lauree Chandler, MD  Electrophysiologist:  None   Chief Complaint:  Follow up-CAD  History of Present Illness:    Jose Garrett is a 68 y.o. male with history of ESRD on HD, AL amyloid (kidney involvement), aortic stenosis, HTN, HLD, GERD, anemia, chronic combined CHF and CAD who is being seen today by virtual e-visit due to the [place] pandemic. He was admitted to [facility] September 2019 with acute respiratory failure in setting of hypertensive urgency. His troponin was elevated and his EKG showed diffuse ST depression. Cardiac cath 11/25/17 with occlusion of the RCA which was felt to be the culprit but this vessel could not be engaged. The distal RCA filled from collaterals. There was moderate disease in the ostial Circumflex and mild disease in the LAD. Medical therapy was recommended. LVEF=35-40%. There was moderate aortic stenosis. Echo on 12/19/17 with LVEF=65-70%. Moderate AS (mean gradient 31 mmHg). He has been off of Coreg and Ace-inh due to hypotension in dialysis. His amyloid is followed by oncology whose notes indicate prior  tx with CyBorD, Velcade, Ixazomib andDexamethasone. Bone marrow with no overt evidence of multiple myeloma. PET/CT scan showed no evidence of bony lesions or lymphadenopathy. He had neuropathy with some of his treatment measures so is no longer on chemotherapy.He was admitted to [facility] January 2020 with dyspnea. Cardiac cath 03/25/18 with stable CAD. Moderate AS by cath.   He tells me today that he feels well overall. He continues to have fall in BP in hour 3 of dialysis. He has no dizziness, chest pain or dyspnea at home.   The patient does not have symptoms concerning for COVID-19 infection (fever, chills, cough, or new shortness of breath).    Past Medical History:  Diagnosis Date   Amyloidosis ([place])    Anemia Dx 2016   CAD (coronary artery disease)    a. cardiac cath on 11/25/17 with occlusion of the RCA which could have been his event but the RCA could not be engaged. The RCA filled distally from left to right collaterals. There were no severe stenoses in the left coronary system. Medical therapy recommended, started on plavix.   Chronic combined systolic and diastolic CHF (congestive heart failure) ([place])    a. EF dropped to 35-40% with grade 2 DD by echo 11/2017.   Diarrhea    ESRD (end stage renal disease) ([place])    Family history of adverse reaction to anesthesia    "my daughter can't take certain anesthesia agents" (09/08/2014)   GERD (gastroesophageal reflux disease)    History of blood transfusion 09/08/2014   "got hematoma after renal biopsy &  HgB dropped"   Hyperlipidemia Dx 2012   Hypertension Dx 2012   Restless legs    Past Surgical History:  Procedure Laterality Date   AV FISTULA PLACEMENT Right 12/27/2015   Procedure: Right Arm ARTERIOVENOUS (AV) FISTULA CREATION;  Surgeon: Angelia Mould, MD;  Location: [place];  Service: Vascular;  Laterality: Right;   COLONOSCOPY     IR GENERIC HISTORICAL  01/27/2016   IR FLUORO GUIDE CV LINE RIGHT 01/27/2016 Arne Cleveland, MD [facility]INTERV RAD   IR GENERIC HISTORICAL  01/27/2016   IR US GUIDE VASC ACCESS RIGHT 01/27/2016 Arne Cleveland, MD [facility]INTER[facility]   LAPAROSCOPIC CHOLECYSTECTOMY  03/2011   LEFT HEART CATH AND CORONARY ANGIOGRAPHY N/A 11/25/2017   Procedure: LEFT HEART CATH AND CORONARY ANGIOGRAPHY;  Surgeon: Martinique, Peter M, MD;  Location: [place] CV LAB;  Service: Cardiovascular;  Laterality: N/A;   RENAL BIOPSY, PERCUTANEOUS Right 09/08/2014   RIGHT/LEFT HEART CATH AND CORONARY ANGIOGRAPHY N/A 03/25/2018   Procedure: RIGHT/LEFT HEART CATH AND CORONARY ANGIOGRAPHY;  Surgeon: Martinique, Peter M, MD;  Location: [place] CV LAB;  Service: Cardiovascular;  Laterality: N/A;   TONSILLECTOMY  ~ [place]  03/25/2018   Procedure: Ultrasound Guidance For Vascular Access;  Surgeon: Martinique, Peter M, MD;  Location: [place] CV LAB;  Service: Cardiovascular;;     Current Meds  Medication Sig   acetaminophen (TYLENOL) 325 MG tablet Take 325-650 mg by mouth every 6 (six) hours as needed (back pain).   aspirin EC 81 MG tablet Take 1 tablet (81 mg total) by mouth daily.   atorvastatin (LIPITOR) 80 MG tablet Take 80 mg by mouth daily with supper.    B Complex-C-Folic Acid (DIALYVITE 425) 0.8 MG TABS Take 0.8 mg by mouth daily.   clopidogrel (PLAVIX) 75 MG tablet Take 1 tablet (75 mg total) by mouth daily.   Cyanocobalamin 2500 MCG TABS Take 2,500 mcg by mouth daily.    cyclobenzaprine (FLEXERIL) 5 MG tablet Take every 8 hours as needed for muscle spasms   ethyl chloride spray Apply 1 application topically See admin instructions. Spray on arm immediately prior to dialysis on Monday, Wednesday and Friday   gabapentin (NEURONTIN) 300 MG capsule TAKE 1 CAPSULE BY MOUTH EVERY MORNING AND 3 CAPSULES EACH NIGHT AT BEDTIME   oxyCODONE (OXY IR/ROXICODONE) 5 MG immediate release tablet Take 1-2 tablets (5-10 mg total) by mouth every 6 (six) hours as needed for severe  pain.   oxyCODONE (OXYCONTIN) 10 mg 12 hr tablet Take 10 mg by mouth daily as needed (severe pain).    sucroferric oxyhydroxide (VELPHORO) 500 MG chewable tablet Chew 500-1,000 mg by mouth See admin instructions. Mix 2 tablets (1000 mg) in applesauce and take with full size meal, mix 1 - 1/2 tablets (500 - 750 mg) in applesauce for  light meals.     Allergies:   No known allergies   Social History   Tobacco Use   Smoking status: Former Smoker    Packs/day: 2.00    Years: 40.00    Pack years: 80.00    Types: Cigarettes    Last attempt to quit: 03/19/2011    Years since quitting: 7.2   Smokeless tobacco: Never Used  Substance Use Topics   Alcohol use: No    Alcohol/week: 0.0 standard drinks   Drug use: No    Comment: 09/08/2014 "I have used marijuana till the 1990's". Notes that he quit 15 yrs ago  Family Hx: The patient's family history includes Diabetes in his father, mother, and sister; Heart disease in his mother; Hypertension in his father and mother; Skin cancer in his mother.  ROS:   Please see the history of present illness.    All other systems reviewed and are negative.   Prior CV studies:   The following studies were reviewed today:  Echo January 2020: Left ventricle: The cavity size was normal. Wall thickness was   increased in a pattern of moderate LVH. Systolic function was   mildly to moderately reduced. The estimated ejection fraction was   in the range of 40% to 45%. Diffuse hypokinesis. Doppler   parameters are consistent with abnormal left ventricular   relaxation (grade 1 diastolic dysfunction). The E/e&' ratio is   >15, suggesting elevated LV filling pressure. - Aortic valve: Calcified with moderate stenosis. Mean gradient   (S): 30 mm Hg. Max gradient (S): 47 mmHg. Valve area (VTI): 1   cm^2. Valve area (Vmean): 1.08 cm^2. - Mitral valve: Calcified annulus. Mildly thickened leaflets .   There was mild regurgitation. - Left atrium: The atrium  was normal in size. - Inferior vena cava: The vessel was normal in size. The   respirophasic diameter changes were in the normal range (>= 50%),   consistent with normal central venous pressure.  Impressions:  - Compared to a prior study in 12/2017, the LVEF is lower at 40-45%   with global hypokinesis, elevated LV filling pressure and at   least moderate aortic stenosis - mean gradient of 30 mmHg   (consider low flow, low gradient severe AS).  Labs/Other Tests and Data Reviewed:    EKG:  No ECG reviewed.  Recent Labs: 03/23/2018: ALT 18; B Natriuretic Peptide 362.8; Magnesium 1.9 03/26/2018: BUN 60; Creatinine, Ser 9.90; Hemoglobin 8.7; Platelets 82; Potassium 5.5; Sodium 133; TSH 1.591   Recent Lipid Panel Lab Results  Component Value Date/Time   CHOL 117 02/12/2018 08:23 AM   TRIG 174 (H) 02/12/2018 08:23 AM   HDL 32 (L) 02/12/2018 08:23 AM   CHOLHDL 3.7 02/12/2018 08:23 AM   CHOLHDL 7.4 02/25/2014 09:59 AM   LDLCALC 50 02/12/2018 08:23 AM    Wt Readings from Last 3 Encounters:  07/03/18 191 lb (86.6 kg)  04/24/18 186 lb (84.4 kg)  03/26/18 180 lb 12.4 oz (82 kg)     Objective:    Vital Signs:  BP 120/80 Comment: BP from dialysis yesterday   Ht '5\' 10"'$  (1.778 m)    Wt 191 lb (86.6 kg)    BMI 27.41 kg/m    VITAL SIGNS:  reviewed GEN:  no acute distress  ASSESSMENT & PLAN:    1. CAD without angina: Cardiac cath January 2020 with stable CAD, known chronic occlusion of the RCA. Continue ASA, Plavix and statin. He is not on a beta blocker given hypotension on dialysis days.    2. Ischemic Cardiomyopathy/Chronic combined CHF: LVEF=45% on echo January 2020. Unable to tolerate beta blocker, Ace-inh or ARB due to hypotension on dialysis. Volume managed with HD.   3. Aortic stenosis: Moderate AS by echo January 2020.  Mean gradient 30 mmHg, peak gradient 47 mmHg, AVA 1.0 cm2, dimensionless index 0.29. Stroke volume/BSA 39. I suspect that he will need TAVR over the next  year. At this time, he is having some fall in BP with HD but overall stable.   4. HTN: BP has been well controlled. Followed in Nephrology  5. Carotid artery disease: Mild  bilateral carotid artery disease by dopplers October 2019.   6. ESRD: on HD.   7. Hyperlipidemia: LDL at goal. Will continue statin. [name]   COVID-19 Education: The signs and symptoms of COVID-19 were discussed with the patient and how to seek care for testing (follow up with PCP or arrange E-visit).  The importance of social distancing was discussed today.  Time:   Today, I have spent 15 minutes with the patient with telehealth technology discussing the above problems.     Medication Adjustments/Labs and Tests Ordered: Current medicines are reviewed at length with the patient today.  Concerns regarding medicines are outlined above.   Tests Ordered: No orders of the defined types were placed in this encounter.   Medication Changes: No orders of the defined types were placed in this encounter.   Disposition:  Follow up in 3 month(s)  Signed, Lauree Chandler, MD  07/03/2018 9:21 AM    [facility]

## 2018-07-03 NOTE — Patient Instructions (Signed)
Medication Instructions:  Your physician recommends that you continue on your current medications as directed. Please refer to the Current Medication list given to you today.  If you need a refill on your cardiac medications before your next appointment, please call your pharmacy.   Lab work: None Ordered  If you have labs (blood work) drawn today and your tests are completely normal, you will receive your results only by: [name] MyChart Message (if you have MyChart) OR . A paper copy in the mail If you have any lab test that is abnormal or we need to change your treatment, we will call you to review the results.  Testing/Procedures: None Ordered   Follow-Up: Your physician recommends that you return for a follow-up appointment on Thursday August 6 at 1:40 pm with Dr. Angelena Form at the [facility] office Please call to reschedule if this appointment does not work for you  We will mail you a handicap placard from our office as soon as possible

## 2018-07-04 ENCOUNTER — Telehealth: Payer: Self-pay | Admitting: *Deleted

## 2018-07-04 ENCOUNTER — Encounter: Payer: Self-pay | Admitting: Podiatry

## 2018-07-04 ENCOUNTER — Ambulatory Visit (INDEPENDENT_AMBULATORY_CARE_PROVIDER_SITE_OTHER): Payer: Medicare Other | Admitting: Podiatry

## 2018-07-04 ENCOUNTER — Other Ambulatory Visit: Payer: Self-pay

## 2018-07-04 VITALS — Temp 98.1°F

## 2018-07-04 DIAGNOSIS — I6523 Occlusion and stenosis of bilateral carotid arteries: Secondary | ICD-10-CM

## 2018-07-04 DIAGNOSIS — G629 Polyneuropathy, unspecified: Secondary | ICD-10-CM

## 2018-07-04 DIAGNOSIS — N186 End stage renal disease: Secondary | ICD-10-CM | POA: Diagnosis not present

## 2018-07-04 DIAGNOSIS — I739 Peripheral vascular disease, unspecified: Secondary | ICD-10-CM

## 2018-07-04 DIAGNOSIS — M79675 Pain in left toe(s): Secondary | ICD-10-CM | POA: Diagnosis not present

## 2018-07-04 DIAGNOSIS — M79674 Pain in right toe(s): Secondary | ICD-10-CM

## 2018-07-04 DIAGNOSIS — D509 Iron deficiency anemia, unspecified: Secondary | ICD-10-CM | POA: Diagnosis not present

## 2018-07-04 DIAGNOSIS — B351 Tinea unguium: Secondary | ICD-10-CM | POA: Diagnosis not present

## 2018-07-04 DIAGNOSIS — D631 Anemia in chronic kidney disease: Secondary | ICD-10-CM | POA: Diagnosis not present

## 2018-07-04 DIAGNOSIS — N2581 Secondary hyperparathyroidism of renal origin: Secondary | ICD-10-CM | POA: Diagnosis not present

## 2018-07-04 NOTE — Telephone Encounter (Signed)
Orders faxed to [facility] doppler lab, and referral to [facility] main fax.

## 2018-07-04 NOTE — Progress Notes (Signed)
Complaint:  Visit Type: Patient presents  to my office for  preventative foot care services. Complaint: Patient states" my nails have grown long and thick and become painful to walk and wear shoes" Patient has been diagnosed with  with neuropathy and is a dialysis patient.. The patient presents for preventative foot care services. No changes to ROS.  Patient says he has discoloration which has developed over the top of both feet.  The skin is purplish.  His cardiologist discussed this condition with this patient.   Podiatric Exam: Vascular: dorsalis pedis and posterior tibial pulses are not  palpable bilateral. Capillary return is diminished . Cold feet noted.  Purplish skin discoloration multiple toe especially his 2,3 left foot.   Skin turgor WNL  Sensorium: Diminished  Semmes Weinstein monofilament test. Normal tactile sensation bilaterally. Nail Exam: Pt has thick disfigured discolored nails with subungual debris noted bilateral entire nail hallux through fifth toenails Ulcer Exam: There is no evidence of ulcer or pre-ulcerative changes or infection. Orthopedic Exam: Muscle tone and strength are WNL. No limitations in general ROM. No crepitus or effusions noted. Foot type and digits show no abnormalities. Bony prominences are unremarkable. Skin: No Porokeratosis. No infection or ulcers  Diagnosis:  Onychomycosis, , Pain in right toe, pain in left toes,  Neuropathy  PVD.    Treatment & Plan Procedures and Treatment: Consent by patient was obtained for treatment procedures.   Debridement of mycotic and hypertrophic toenails, 1 through 5 bilateral and clearing of subungual debris. No ulceration, no infection noted. I believe he has had circulatory changes since his last visit and recommend vascular testing treatment. Marcy Siren will schedule this appointment.  Return Visit-Office Procedure: Patient instructed to return to the office for a follow up visit 3 months for continued evaluation and  treatment.    Gardiner Barefoot DPM

## 2018-07-04 NOTE — Telephone Encounter (Signed)
Dr. Prudence Davidson requested pt be referred for cardiovascular consultation and testing.

## 2018-07-07 DIAGNOSIS — D631 Anemia in chronic kidney disease: Secondary | ICD-10-CM | POA: Diagnosis not present

## 2018-07-07 DIAGNOSIS — N2581 Secondary hyperparathyroidism of renal origin: Secondary | ICD-10-CM | POA: Diagnosis not present

## 2018-07-07 DIAGNOSIS — N186 End stage renal disease: Secondary | ICD-10-CM | POA: Diagnosis not present

## 2018-07-07 DIAGNOSIS — D509 Iron deficiency anemia, unspecified: Secondary | ICD-10-CM | POA: Diagnosis not present

## 2018-07-08 ENCOUNTER — Other Ambulatory Visit: Payer: Self-pay

## 2018-07-08 ENCOUNTER — Telehealth: Payer: Self-pay | Admitting: *Deleted

## 2018-07-08 ENCOUNTER — Ambulatory Visit (HOSPITAL_COMMUNITY)
Admission: RE | Admit: 2018-07-08 | Discharge: 2018-07-08 | Disposition: A | Discharge status: Home / Self Care | Payer: Medicare Other | Source: Ambulatory Visit | Attending: Podiatry | Admitting: Podiatry

## 2018-07-08 DIAGNOSIS — G629 Polyneuropathy, unspecified: Secondary | ICD-10-CM | POA: Insufficient documentation

## 2018-07-08 DIAGNOSIS — I739 Peripheral vascular disease, unspecified: Secondary | ICD-10-CM | POA: Diagnosis not present

## 2018-07-08 NOTE — Telephone Encounter (Signed)
Call placed to the patient to schedule an urgent visit with Dr. Fletcher Anon for abnormal dopplers. The patient has been set up for 9 am on 07/09/2018 for a doximity visit.   Consent and instructions have been sent to the patient's MyChart.

## 2018-07-08 NOTE — Telephone Encounter (Signed)
I asked [facility] - Falecha the best way to fax a referral for easy pt scheduling. Falecha stated she would hand deliver referrals to the schedulers if faxed to her. Faxed the referral made and faxed to [facility] on 07/04/2018 to [place].

## 2018-07-09 ENCOUNTER — Telehealth: Payer: Self-pay | Admitting: *Deleted

## 2018-07-09 ENCOUNTER — Encounter: Payer: Self-pay | Admitting: Cardiovascular Disease

## 2018-07-09 ENCOUNTER — Telehealth (INDEPENDENT_AMBULATORY_CARE_PROVIDER_SITE_OTHER): Payer: Medicare Other | Admitting: Cardiovascular Disease

## 2018-07-09 ENCOUNTER — Encounter: Payer: Self-pay | Admitting: *Deleted

## 2018-07-09 VITALS — BP 115/80 | Ht 70.0 in | Wt 190.0 lb

## 2018-07-09 DIAGNOSIS — D509 Iron deficiency anemia, unspecified: Secondary | ICD-10-CM | POA: Diagnosis not present

## 2018-07-09 DIAGNOSIS — I70229 Atherosclerosis of native arteries of extremities with rest pain, unspecified extremity: Secondary | ICD-10-CM

## 2018-07-09 DIAGNOSIS — D631 Anemia in chronic kidney disease: Secondary | ICD-10-CM | POA: Diagnosis not present

## 2018-07-09 DIAGNOSIS — N186 End stage renal disease: Secondary | ICD-10-CM | POA: Diagnosis not present

## 2018-07-09 DIAGNOSIS — Z01818 Encounter for other preprocedural examination: Secondary | ICD-10-CM

## 2018-07-09 DIAGNOSIS — I998 Other disorder of circulatory system: Secondary | ICD-10-CM | POA: Diagnosis not present

## 2018-07-09 DIAGNOSIS — N2581 Secondary hyperparathyroidism of renal origin: Secondary | ICD-10-CM | POA: Diagnosis not present

## 2018-07-09 NOTE — Patient Instructions (Signed)
Medication Instructions:  No changes  If you need a refill on your cardiac medications before your next appointment, please call your pharmacy.   Lab work: Your provider would like for you to return by Friday 07/11/2018 to have the following labs drawn: BMET and CBC. You do not need an appointment for the lab. Once in our office lobby there is a podium where you can sign in and ring the doorbell to alert [place] that you are here. The lab is open from 8:00 am to 4:30 pm; closed for lunch from 12:45pm-1:45pm.  Testing/Procedures: Your physician has requested that you have a peripheral vascular angiogram. This exam is performed at the hospital. During this exam IV contrast is used to look at arterial blood flow. Please review the information sheet given for details.   Follow-Up: At [facility], you and your health needs are our priority.  As part of our continuing mission to provide you with exceptional heart care, we have created designated Provider Care Teams.  These Care Teams include your primary Cardiologist (physician) and Advanced Practice Providers (APPs -  Physician Assistants and Nurse Practitioners) who all work together to provide you with the care you need, when you need it. You will need a follow up appointment in 1 monthYou may see Kathlyn Sacramento, MD or one of the following Advanced Practice Providers on your designated Care Team:   Kerin Ransom, PA-C Roby Lofts, [place] . Sande Rives, PA-C  [name], I Lattie Haw) will call you after the procedure to set up your follow up appointment. Please call if you have any questions at all.  Any Other Special Instructions Will Be Listed Below (If Applicable).    [facility] [address] [place]rie [place] [address] Dept: [phone] Loc: [place]  07/09/2018  You are scheduled for a Peripheral Angiogram on Wednesday, May 6 with  Dr. Kathlyn Sacramento.  1. Please arrive at the [facility] (Main Entrance A) at [facility]: [address] [place], [place] [address] at 6:30 AM (This time is two hours before your procedure to ensure your preparation). Free valet parking service is available.   Special note: Every effort is made to have your procedure done on time. Please understand that emergencies sometimes delay scheduled procedures.  2. Diet: Do not eat solid foods after midnight.  You may have clear liquids until 5am upon the day of the procedure.  3. Labs: You will need to have blood drawn by Friday May 1st.  4. Medication instructions in preparation for your procedure: None to hold   On the morning of your procedure, take your Aspirin and Plavix/Clopidogrel and any morning medicines NOT listed above.  You may use sips of water.  5. Plan for one night stay--bring personal belongings. 6. Bring a current list of your medications and current insurance cards. 7. You MUST have a responsible person to drive you home. 8. Someone MUST be with you the first 24 hours after you arrive home or your discharge will be delayed. 9. Please wear clothes that are easy to get on and off and wear slip-on shoes.  Thank you for allowing [place] to care for you!   -- [facility]

## 2018-07-09 NOTE — Telephone Encounter (Signed)
Patient has been called and given instructions for his pv angiogram on May 6th with Dr. Fletcher Anon. Instructions has been sent to [place] and everything has been gone over on the phone.  He will take his aspirin and plavix the morning of the procedure.  He stated that he may not be able to get lab work done prior to the procedure so these may need to be done at the hospital. A call will be placed to his dialysis center to see what labwork results they have available.

## 2018-07-09 NOTE — Telephone Encounter (Signed)
Error. Duplicate encounter.

## 2018-07-09 NOTE — H&P (View-Only) (Signed)
Virtual Visit via Video Note   This visit type was conducted due to national recommendations for restrictions regarding the COVID-19 Pandemic (e.g. social distancing) in an effort to limit this patient's exposure and mitigate transmission in our community.  Due to his co-morbid illnesses, this patient is at least at moderate risk for complications without adequate follow up.  This format is felt to be most appropriate for this patient at this time.  All issues noted in this document were discussed and addressed.  A limited physical exam was performed with this format.  Please refer to the patient's chart for his consent to telehealth for [facility].   Evaluation Performed:  Follow-up visit  Date:  07/09/2018   ID:  Jose Garrett, DOB 1950-04-16, MRN [id number]  Patient Location: Home Provider Location: Home  PCP:  Patient, No Pcp Per  Cardiologist:  Lauree Chandler, MD  Electrophysiologist:  None   Chief Complaint:  Dark discoloration of toes  History of Present Illness:    Jose Garrett is a 68 y.o. male was added to my schedule urgently due to very abnormal vascular studies.  He was reached initially via video but had to switch to a phone call given that the connection was not optimal. He has extensive medical problems including ESRD on HD, AL amyloid (kidney involvement), aortic stenosis, HTN, HLD, GERD, anemia, chronic combined CHF and CAD . He was admitted to [facility] September 2019 with acute respiratory failure in setting of hypertensive urgency. His troponin was elevated and his EKG showed diffuse ST depression. Cardiac cath 11/25/17 with occlusion of the RCA which was felt to be the culprit but this vessel could not be engaged. The distal RCA filled from collaterals. There was moderate disease in the ostial Circumflex and mild disease in the LAD. Medical therapy was recommended. LVEF=35-40%. There was moderate aortic stenosis. Echo on 12/19/17 with LVEF=65-70%. Moderate AS  (mean gradient 31 mmHg).   His amyloid is followed by oncology whose notes indicate prior tx with CyBorD, Velcade, Ixazomib andDexamethasone. Bone marrow with no overt evidence of multiple myeloma. PET/CT scan showed no evidence of bony lesions or lymphadenopathy. He had neuropathy with some of his treatment measures so is no longer on chemotherapy.Last oncology note indicates serologic remission.  Over the last 2 months he has noticed progressive dark discoloration of the toes especially on the left side with increased pain in the foot even when he is resting.  He has no gangrene.  He has balance problems and walks with a cane.  He underwent noninvasive vascular studies in the office yesterday which showed noncompressible vessels with no flow detected in the left toes and very dampened flow in the right toes.  Duplex showed bilateral SFA occlusion and in addition there was left popliteal artery occlusion and TP trunk occlusion.  The patient does not have symptoms concerning for COVID-19 infection (fever, chills, cough, or new shortness of breath).    Past Medical History:  Diagnosis Date   Amyloidosis ([place])    Anemia Dx 2016   CAD (coronary artery disease)    a. cardiac cath on 11/25/17 with occlusion of the RCA which could have been his event but the RCA could not be engaged. The RCA filled distally from left to right collaterals. There were no severe stenoses in the left coronary system. Medical therapy recommended, started on plavix.   Chronic combined systolic and diastolic CHF (congestive heart failure) (HCC)    a. EF dropped to 35-40% with  grade 2 DD by echo 11/2017.   Diarrhea    ESRD (end stage renal disease) ([place])    Family history of adverse reaction to anesthesia    "my daughter can't take certain anesthesia agents" (09/08/2014)   GERD (gastroesophageal reflux disease)    History of blood transfusion 09/08/2014   "got hematoma after renal biopsy & HgB dropped"    Hyperlipidemia Dx 2012   Hypertension Dx 2012   Restless legs    Past Surgical History:  Procedure Laterality Date   AV FISTULA PLACEMENT Right 12/27/2015   Procedure: Right Arm ARTERIOVENOUS (AV) FISTULA CREATION;  Surgeon: Angelia Mould, MD;  Location: [place];  Service: Vascular;  Laterality: Right;   COLONOSCOPY     IR GENERIC HISTORICAL  01/27/2016   IR FLUORO GUIDE CV LINE RIGHT 01/27/2016 Arne Cleveland, MD [facility]   IR GENERIC HISTORICAL  01/27/2016   IR US GUIDE VASC ACCESS RIGHT 01/27/2016 Arne Cleveland, MD [facility]   LAPAROSCOPIC CHOLECYSTECTOMY  03/2011   LEFT HEART CATH AND CORONARY ANGIOGRAPHY N/A 11/25/2017   Procedure: LEFT HEART CATH AND CORONARY ANGIOGRAPHY;  Surgeon: Martinique, Peter M, MD;  Location: [place] [facility];  Service: Cardiovascular;  Laterality: N/A;   RENAL BIOPSY, PERCUTANEOUS Right 09/08/2014   RIGHT/LEFT HEART CATH AND CORONARY ANGIOGRAPHY N/A 03/25/2018   Procedure: RIGHT/LEFT HEART CATH AND CORONARY ANGIOGRAPHY;  Surgeon: Martinique, Peter M, MD;  Location: [place] [facility];  Service: Cardiovascular;  Laterality: N/A;   TONSILLECTOMY  ~ [place]  03/25/2018   Procedure: Ultrasound Guidance For Vascular Access;  Surgeon: Martinique, Peter M, MD;  Location: [place] [facility];  Service: Cardiovascular;;     Current Meds  Medication Sig   acetaminophen (TYLENOL) 325 MG tablet Take 325-650 mg by mouth every 6 (six) hours as needed (back pain).   aspirin EC 81 MG tablet Take 1 tablet (81 mg total) by mouth daily.   atorvastatin (LIPITOR) 80 MG tablet Take 80 mg by mouth daily with supper.    B Complex-C-Folic Acid (DIALYVITE 728) 0.8 MG TABS Take 0.8 mg by mouth daily.   clopidogrel (PLAVIX) 75 MG tablet Take 1 tablet (75 mg total) by mouth daily.   Cyanocobalamin 2500 MCG TABS Take 2,500 mcg by mouth daily.    cyclobenzaprine (FLEXERIL) 5 MG tablet Take every 8 hours as needed for muscle  spasms   ethyl chloride spray Apply 1 application topically See admin instructions. Spray on arm immediately prior to dialysis on Monday, Wednesday and Friday   gabapentin (NEURONTIN) 300 MG capsule TAKE 1 CAPSULE BY MOUTH EVERY MORNING AND 3 CAPSULES EACH NIGHT AT BEDTIME   oxyCODONE (OXY IR/ROXICODONE) 5 MG immediate release tablet Take 1-2 tablets (5-10 mg total) by mouth every 6 (six) hours as needed for severe pain.   oxyCODONE (OXYCONTIN) 10 mg 12 hr tablet Take 10 mg by mouth daily as needed (severe pain).    sucroferric oxyhydroxide (VELPHORO) 500 MG chewable tablet Chew 500-1,000 mg by mouth See admin instructions. Mix 2 tablets (1000 mg) in applesauce and take with full size meal, mix 1 - 1/2 tablets (500 - 750 mg) in applesauce for  light meals.     Allergies:   No known allergies   Social History   Tobacco Use   Smoking status: Former Smoker    Packs/day: 2.00    Years: 40.00    Pack years: 80.00    Types: Cigarettes  Last attempt to quit: 03/19/2011    Years since quitting: 7.3   Smokeless tobacco: Never Used  Substance Use Topics   Alcohol use: No    Alcohol/week: 0.0 standard drinks   Drug use: No    Comment: 09/08/2014 "I have used marijuana till the 1990's". Notes that he quit 15 yrs ago     Family Hx: The patient's family history includes Diabetes in his father, mother, and sister; Heart disease in his mother; Hypertension in his father and mother; Skin cancer in his mother.  ROS:   Please see the history of present illness.     All other systems reviewed and are negative.   Prior CV studies:   The following studies were reviewed today:  I reviewed vascular studies done yesterday with him.  Labs/Other Tests and Data Reviewed:    EKG:  No ECG reviewed.  Recent Labs: 03/23/2018: ALT 18; B Natriuretic Peptide 362.8; Magnesium 1.9 03/26/2018: BUN 60; Creatinine, Ser 9.90; Hemoglobin 8.7; Platelets 82; Potassium 5.5; Sodium 133; TSH 1.591   Recent  Lipid Panel Lab Results  Component Value Date/Time   CHOL 117 02/12/2018 08:23 AM   TRIG 174 (H) 02/12/2018 08:23 AM   HDL 32 (L) 02/12/2018 08:23 AM   CHOLHDL 3.7 02/12/2018 08:23 AM   CHOLHDL 7.4 02/25/2014 09:59 AM   LDLCALC 50 02/12/2018 08:23 AM    Wt Readings from Last 3 Encounters:  07/09/18 190 lb (86.2 kg)  07/03/18 191 lb (86.6 kg)  04/24/18 186 lb (84.4 kg)     Objective:    Vital Signs:  BP 115/80    Ht _0  (1.778 m)    Wt 190 lb (86.2 kg)    BMI 27.26 kg/m    VITAL SIGNS:  reviewed  ASSESSMENT & PLAN:    1. Critical limb ischemia: Bilateral rest pain with early gangrenous changes worse on the left side.  This is a limb threatening situation.  I had an extensive discussion with the patient about the findings.  I recommend proceeding with urgent abdominal aortogram with lower extremity runoff and possible endovascular intervention.  I discussed the procedure in details as well as risks and benefits.  Planned access via the right common femoral artery.  He will notify his dialysis center to see if he can have dialysis on Thursday instead.  If he requires admission, he can undergo dialysis inpatient.  I also discussed the possibility of needing surgical revascularization given extensive disease.  We will schedule the procedure for next week.  He is already on dual antiplatelet therapy 2. Coronary artery disease involving native coronary arteries: He is being treated medically with no significant angina. 3. Chronic systolic heart failure: Seems to be stable.  He is not able to tolerate heart failure medications due to hypotension during dialysis 4. End-stage renal disease on hemodialysis Monday Wednesday Friday. 5. Hyperlipidemia: Continue high-dose atorvastatin.  COVID-19 Education: The signs and symptoms of COVID-19 were discussed with the patient and how to seek care for testing (follow up with PCP or arrange E-visit).  The importance of social distancing was discussed  today.  Time:   Today, I have spent 30 minutes with the patient with telehealth technology discussing the above problems.     Medication Adjustments/Labs and Tests Ordered: Current medicines are reviewed at length with the patient today.  Concerns regarding medicines are outlined above.   Tests Ordered: No orders of the defined types were placed in this encounter.   Medication Changes: No orders  of the defined types were placed in this encounter.   Disposition:  Follow up in 1 month(s)  Signed, Kathlyn Sacramento, MD  07/09/2018 9:04 AM    [facility]

## 2018-07-09 NOTE — Progress Notes (Signed)
Virtual Visit via Video Note   This visit type was conducted due to national recommendations for restrictions regarding the COVID-19 Pandemic (e.g. social distancing) in an effort to limit this patient's exposure and mitigate transmission in our community.  Due to his co-morbid illnesses, this patient is at least at moderate risk for complications without adequate follow up.  This format is felt to be most appropriate for this patient at this time.  All issues noted in this document were discussed and addressed.  A limited physical exam was performed with this format.  Please refer to the patient's chart for his consent to telehealth for [facility].   Evaluation Performed:  Follow-up visit  Date:  07/09/2018   ID:  Jose Garrett, DOB 1950-04-16, MRN [id number]  Patient Location: Home Provider Location: Home  PCP:  Patient, No Pcp Per  Cardiologist:  Lauree Chandler, MD  Electrophysiologist:  None   Chief Complaint:  Dark discoloration of toes  History of Present Illness:    Jose Garrett is a 68 y.o. male was added to my schedule urgently due to very abnormal vascular studies.  He was reached initially via video but had to switch to a phone call given that the connection was not optimal. He has extensive medical problems including ESRD on HD, AL amyloid (kidney involvement), aortic stenosis, HTN, HLD, GERD, anemia, chronic combined CHF and CAD . He was admitted to [facility] September 2019 with acute respiratory failure in setting of hypertensive urgency. His troponin was elevated and his EKG showed diffuse ST depression. Cardiac cath 11/25/17 with occlusion of the RCA which was felt to be the culprit but this vessel could not be engaged. The distal RCA filled from collaterals. There was moderate disease in the ostial Circumflex and mild disease in the LAD. Medical therapy was recommended. LVEF=35-40%. There was moderate aortic stenosis. Echo on 12/19/17 with LVEF=65-70%. Moderate AS  (mean gradient 31 mmHg).   His amyloid is followed by oncology whose notes indicate prior tx with CyBorD, Velcade, Ixazomib andDexamethasone. Bone marrow with no overt evidence of multiple myeloma. PET/CT scan showed no evidence of bony lesions or lymphadenopathy. He had neuropathy with some of his treatment measures so is no longer on chemotherapy.Last oncology note indicates serologic remission.  Over the last 2 months he has noticed progressive dark discoloration of the toes especially on the left side with increased pain in the foot even when he is resting.  He has no gangrene.  He has balance problems and walks with a cane.  He underwent noninvasive vascular studies in the office yesterday which showed noncompressible vessels with no flow detected in the left toes and very dampened flow in the right toes.  Duplex showed bilateral SFA occlusion and in addition there was left popliteal artery occlusion and TP trunk occlusion.  The patient does not have symptoms concerning for COVID-19 infection (fever, chills, cough, or new shortness of breath).    Past Medical History:  Diagnosis Date   Amyloidosis ([place])    Anemia Dx 2016   CAD (coronary artery disease)    a. cardiac cath on 11/25/17 with occlusion of the RCA which could have been his event but the RCA could not be engaged. The RCA filled distally from left to right collaterals. There were no severe stenoses in the left coronary system. Medical therapy recommended, started on plavix.   Chronic combined systolic and diastolic CHF (congestive heart failure) (HCC)    a. EF dropped to 35-40% with  grade 2 DD by echo 11/2017.   Diarrhea    ESRD (end stage renal disease) ([place])    Family history of adverse reaction to anesthesia    "my daughter can't take certain anesthesia agents" (09/08/2014)   GERD (gastroesophageal reflux disease)    History of blood transfusion 09/08/2014   "got hematoma after renal biopsy & HgB dropped"    Hyperlipidemia Dx 2012   Hypertension Dx 2012   Restless legs    Past Surgical History:  Procedure Laterality Date   AV FISTULA PLACEMENT Right 12/27/2015   Procedure: Right Arm ARTERIOVENOUS (AV) FISTULA CREATION;  Surgeon: Angelia Mould, MD;  Location: [place];  Service: Vascular;  Laterality: Right;   COLONOSCOPY     IR GENERIC HISTORICAL  01/27/2016   IR FLUORO GUIDE CV LINE RIGHT 01/27/2016 Arne Cleveland, MD [facility]   IR GENERIC HISTORICAL  01/27/2016   IR US GUIDE VASC ACCESS RIGHT 01/27/2016 Arne Cleveland, MD [facility]   LAPAROSCOPIC CHOLECYSTECTOMY  03/2011   LEFT HEART CATH AND CORONARY ANGIOGRAPHY N/A 11/25/2017   Procedure: LEFT HEART CATH AND CORONARY ANGIOGRAPHY;  Surgeon: Martinique, Peter M, MD;  Location: [place] [facility];  Service: Cardiovascular;  Laterality: N/A;   RENAL BIOPSY, PERCUTANEOUS Right 09/08/2014   RIGHT/LEFT HEART CATH AND CORONARY ANGIOGRAPHY N/A 03/25/2018   Procedure: RIGHT/LEFT HEART CATH AND CORONARY ANGIOGRAPHY;  Surgeon: Martinique, Peter M, MD;  Location: [place] [facility];  Service: Cardiovascular;  Laterality: N/A;   TONSILLECTOMY  ~ [place]  03/25/2018   Procedure: Ultrasound Guidance For Vascular Access;  Surgeon: Martinique, Peter M, MD;  Location: [place] [facility];  Service: Cardiovascular;;     Current Meds  Medication Sig   acetaminophen (TYLENOL) 325 MG tablet Take 325-650 mg by mouth every 6 (six) hours as needed (back pain).   aspirin EC 81 MG tablet Take 1 tablet (81 mg total) by mouth daily.   atorvastatin (LIPITOR) 80 MG tablet Take 80 mg by mouth daily with supper.    B Complex-C-Folic Acid (DIALYVITE 728) 0.8 MG TABS Take 0.8 mg by mouth daily.   clopidogrel (PLAVIX) 75 MG tablet Take 1 tablet (75 mg total) by mouth daily.   Cyanocobalamin 2500 MCG TABS Take 2,500 mcg by mouth daily.    cyclobenzaprine (FLEXERIL) 5 MG tablet Take every 8 hours as needed for muscle  spasms   ethyl chloride spray Apply 1 application topically See admin instructions. Spray on arm immediately prior to dialysis on Monday, Wednesday and Friday   gabapentin (NEURONTIN) 300 MG capsule TAKE 1 CAPSULE BY MOUTH EVERY MORNING AND 3 CAPSULES EACH NIGHT AT BEDTIME   oxyCODONE (OXY IR/ROXICODONE) 5 MG immediate release tablet Take 1-2 tablets (5-10 mg total) by mouth every 6 (six) hours as needed for severe pain.   oxyCODONE (OXYCONTIN) 10 mg 12 hr tablet Take 10 mg by mouth daily as needed (severe pain).    sucroferric oxyhydroxide (VELPHORO) 500 MG chewable tablet Chew 500-1,000 mg by mouth See admin instructions. Mix 2 tablets (1000 mg) in applesauce and take with full size meal, mix 1 - 1/2 tablets (500 - 750 mg) in applesauce for  light meals.     Allergies:   No known allergies   Social History   Tobacco Use   Smoking status: Former Smoker    Packs/day: 2.00    Years: 40.00    Pack years: 80.00    Types: Cigarettes  Last attempt to quit: 03/19/2011    Years since quitting: 7.3   Smokeless tobacco: Never Used  Substance Use Topics   Alcohol use: No    Alcohol/week: 0.0 standard drinks   Drug use: No    Comment: 09/08/2014 "I have used marijuana till the 1990's". Notes that he quit 15 yrs ago     Family Hx: The patient's family history includes Diabetes in his father, mother, and sister; Heart disease in his mother; Hypertension in his father and mother; Skin cancer in his mother.  ROS:   Please see the history of present illness.     All other systems reviewed and are negative.   Prior CV studies:   The following studies were reviewed today:  I reviewed vascular studies done yesterday with him.  Labs/Other Tests and Data Reviewed:    EKG:  No ECG reviewed.  Recent Labs: 03/23/2018: ALT 18; B Natriuretic Peptide 362.8; Magnesium 1.9 03/26/2018: BUN 60; Creatinine, Ser 9.90; Hemoglobin 8.7; Platelets 82; Potassium 5.5; Sodium 133; TSH 1.591   Recent  Lipid Panel Lab Results  Component Value Date/Time   CHOL 117 02/12/2018 08:23 AM   TRIG 174 (H) 02/12/2018 08:23 AM   HDL 32 (L) 02/12/2018 08:23 AM   CHOLHDL 3.7 02/12/2018 08:23 AM   CHOLHDL 7.4 02/25/2014 09:59 AM   LDLCALC 50 02/12/2018 08:23 AM    Wt Readings from Last 3 Encounters:  07/09/18 190 lb (86.2 kg)  07/03/18 191 lb (86.6 kg)  04/24/18 186 lb (84.4 kg)     Objective:    Vital Signs:  BP 115/80    Ht _0  (1.778 m)    Wt 190 lb (86.2 kg)    BMI 27.26 kg/m    VITAL SIGNS:  reviewed  ASSESSMENT & PLAN:    1. Critical limb ischemia: Bilateral rest pain with early gangrenous changes worse on the left side.  This is a limb threatening situation.  I had an extensive discussion with the patient about the findings.  I recommend proceeding with urgent abdominal aortogram with lower extremity runoff and possible endovascular intervention.  I discussed the procedure in details as well as risks and benefits.  Planned access via the right common femoral artery.  He will notify his dialysis center to see if he can have dialysis on Thursday instead.  If he requires admission, he can undergo dialysis inpatient.  I also discussed the possibility of needing surgical revascularization given extensive disease.  We will schedule the procedure for next week.  He is already on dual antiplatelet therapy 2. Coronary artery disease involving native coronary arteries: He is being treated medically with no significant angina. 3. Chronic systolic heart failure: Seems to be stable.  He is not able to tolerate heart failure medications due to hypotension during dialysis 4. End-stage renal disease on hemodialysis Monday Wednesday Friday. 5. Hyperlipidemia: Continue high-dose atorvastatin.  COVID-19 Education: The signs and symptoms of COVID-19 were discussed with the patient and how to seek care for testing (follow up with PCP or arrange E-visit).  The importance of social distancing was discussed  today.  Time:   Today, I have spent 30 minutes with the patient with telehealth technology discussing the above problems.     Medication Adjustments/Labs and Tests Ordered: Current medicines are reviewed at length with the patient today.  Concerns regarding medicines are outlined above.   Tests Ordered: No orders of the defined types were placed in this encounter.   Medication Changes: No orders  of the defined types were placed in this encounter.   Disposition:  Follow up in 1 month(s)  Signed, Kathlyn Sacramento, MD  07/09/2018 9:04 AM    [facility]

## 2018-07-11 DIAGNOSIS — N186 End stage renal disease: Secondary | ICD-10-CM | POA: Diagnosis not present

## 2018-07-11 DIAGNOSIS — I129 Hypertensive chronic kidney disease with stage 1 through stage 4 chronic kidney disease, or unspecified chronic kidney disease: Secondary | ICD-10-CM | POA: Diagnosis not present

## 2018-07-11 DIAGNOSIS — Z992 Dependence on renal dialysis: Secondary | ICD-10-CM | POA: Diagnosis not present

## 2018-07-11 DIAGNOSIS — N2581 Secondary hyperparathyroidism of renal origin: Secondary | ICD-10-CM | POA: Diagnosis not present

## 2018-07-11 NOTE — Addendum Note (Signed)
Addended by: Ricci Barker on: 07/11/2018 01:54 PM   Modules accepted: Orders

## 2018-07-11 NOTE — Telephone Encounter (Signed)
The patient has been called and advised that he will need to get his labwork done by Monday 07/14/2018. He stated that he will go to the [facility] office Monday morning at 8:30 to get these done.   The orders will be changed to STAT to ensure they are resulted in time.

## 2018-07-14 ENCOUNTER — Other Ambulatory Visit: Payer: Self-pay

## 2018-07-14 DIAGNOSIS — Z01818 Encounter for other preprocedural examination: Secondary | ICD-10-CM | POA: Diagnosis not present

## 2018-07-14 DIAGNOSIS — I70229 Atherosclerosis of native arteries of extremities with rest pain, unspecified extremity: Secondary | ICD-10-CM

## 2018-07-14 DIAGNOSIS — N186 End stage renal disease: Secondary | ICD-10-CM | POA: Diagnosis not present

## 2018-07-14 DIAGNOSIS — I998 Other disorder of circulatory system: Secondary | ICD-10-CM | POA: Diagnosis not present

## 2018-07-14 DIAGNOSIS — N2581 Secondary hyperparathyroidism of renal origin: Secondary | ICD-10-CM | POA: Diagnosis not present

## 2018-07-14 LAB — CBC
Hematocrit: 29.3 % — ABNORMAL LOW (ref 37.5–51.0)
Hemoglobin: 9.5 g/dL — ABNORMAL LOW (ref 13.0–17.7)
MCH: 32.5 pg (ref 26.6–33.0)
MCHC: 32.4 g/dL (ref 31.5–35.7)
MCV: 100 fL — ABNORMAL HIGH (ref 79–97)
Platelets: 115 10*3/uL — ABNORMAL LOW (ref 150–450)
RBC: 2.92 x10E6/uL — ABNORMAL LOW (ref 4.14–5.80)
RDW: 15.6 % — ABNORMAL HIGH (ref 11.6–15.4)
WBC: 6.7 10*3/uL (ref 3.4–10.8)

## 2018-07-14 LAB — BASIC METABOLIC PANEL
BUN/Creatinine Ratio: 6 — ABNORMAL LOW (ref 10–24)
BUN: 76 mg/dL (ref 8–27)
CO2: 27 mmol/L (ref 20–29)
Calcium: 9.4 mg/dL (ref 8.6–10.2)
Chloride: 93 mmol/L — ABNORMAL LOW (ref 96–106)
Creatinine, Ser: 13.07 mg/dL — ABNORMAL HIGH (ref 0.76–1.27)
GFR calc Af Amer: 4 mL/min/{1.73_m2} — ABNORMAL LOW (ref >59)
GFR calc non Af Amer: 3 mL/min/{1.73_m2} — ABNORMAL LOW (ref >59)
Glucose: 155 mg/dL — ABNORMAL HIGH (ref 65–99)
Potassium: 5.4 mmol/L — ABNORMAL HIGH (ref 3.5–5.2)
Sodium: 135 mmol/L (ref 134–144)

## 2018-07-15 ENCOUNTER — Telehealth: Payer: Self-pay | Admitting: *Deleted

## 2018-07-15 DIAGNOSIS — N2581 Secondary hyperparathyroidism of renal origin: Secondary | ICD-10-CM | POA: Diagnosis not present

## 2018-07-15 DIAGNOSIS — N186 End stage renal disease: Secondary | ICD-10-CM | POA: Diagnosis not present

## 2018-07-15 NOTE — Telephone Encounter (Signed)
His legs are in bad shape and definitely needs the angiogram tomorrow.  Thanks

## 2018-07-15 NOTE — Telephone Encounter (Signed)
Pt contacted pre-abdominal aortogram  scheduled at [facility] for: Wednesday Jul 16, 2018 8:30 AM Verified arrival time and place: [place] Entrance A at: 6:30 AM  No solid food after midnight prior to cath, clear liquids until 5 AM day of procedure.   AM meds can be  taken pre-cath with sip of water including: ASA 81 mg Clopidogrel 75 mg  Confirmed patient has responsible person to drive home post procedure and observe 24 hours after arriving home: yes   Pt advised due to Covid-19 pandemic, [facility] is restricting visitors and only patients should present for check-in prior to their procedure. People will not be allowed to enter [facility] with the patient. At this time [facility] is not allowing visitors to all [facility] campuses.       COVID-19 Pre-Screening Questions:  . Do you currently have a fever? no . Have you recently travelled on a cruise, internationally, or to [place], [place], [place], [place], [place], or [place], [place] [employer]) ? no . Have you been in contact with someone that is currently pending confirmation of Covid19 testing or has been confirmed to have the [place] virus?  no . Are you currently experiencing fatigue or cough? no . Are you currently experiencing new or worsening shortness of breath at rest or with minimal activity? no . Have you been in contact with someone that was recently sick with fever/cough/fatigue? no  I reviewed procedure instructions, visitor restrictions, Covid-19 screening questions with patient.   Pt states he had been thinking about  calling Dr Fletcher Anon to let him know about his left foot. Pt states yesterday he noticed weeping and increased swelling of his left foot. Pt states this morning his sock is damp, but did not completely soak through his sock. Pt denies fever, no increase in shortness of breath. Pt states he left dialysis 1-2  hr early yesterday because of discomfort in his foot. Pt is going back to dialysis today  for a short time. Pt denies any other changes in his left foot since virtual visit  with Dr Fletcher Anon 07/09/18.  Pt advised I will forward to Dr Fletcher Anon for review/ recommendations.

## 2018-07-16 ENCOUNTER — Other Ambulatory Visit: Payer: Self-pay

## 2018-07-16 ENCOUNTER — Encounter (HOSPITAL_COMMUNITY)
Admission: RE | Disposition: A | Discharge status: Home / Self Care | Payer: Self-pay | Source: Home / Self Care | Attending: Cardiovascular Disease

## 2018-07-16 ENCOUNTER — Other Ambulatory Visit: Payer: Self-pay | Admitting: *Deleted

## 2018-07-16 ENCOUNTER — Ambulatory Visit (HOSPITAL_COMMUNITY)
Admission: RE | Admit: 2018-07-16 | Discharge: 2018-07-16 | Disposition: A | Discharge status: Home / Self Care | Payer: Medicare Other | Attending: Cardiovascular Disease | Admitting: Cardiovascular Disease

## 2018-07-16 ENCOUNTER — Ambulatory Visit (HOSPITAL_BASED_OUTPATIENT_CLINIC_OR_DEPARTMENT_OTHER): Payer: Medicare Other

## 2018-07-16 DIAGNOSIS — Z7982 Long term (current) use of aspirin: Secondary | ICD-10-CM | POA: Diagnosis not present

## 2018-07-16 DIAGNOSIS — Z7902 Long term (current) use of antithrombotics/antiplatelets: Secondary | ICD-10-CM | POA: Diagnosis not present

## 2018-07-16 DIAGNOSIS — M79662 Pain in left lower leg: Secondary | ICD-10-CM | POA: Diagnosis not present

## 2018-07-16 DIAGNOSIS — I5042 Chronic combined systolic (congestive) and diastolic (congestive) heart failure: Secondary | ICD-10-CM | POA: Insufficient documentation

## 2018-07-16 DIAGNOSIS — I96 Gangrene, not elsewhere classified: Secondary | ICD-10-CM | POA: Diagnosis not present

## 2018-07-16 DIAGNOSIS — I132 Hypertensive heart and chronic kidney disease with heart failure and with stage 5 chronic kidney disease, or end stage renal disease: Secondary | ICD-10-CM | POA: Diagnosis not present

## 2018-07-16 DIAGNOSIS — Z992 Dependence on renal dialysis: Secondary | ICD-10-CM | POA: Insufficient documentation

## 2018-07-16 DIAGNOSIS — Z79899 Other long term (current) drug therapy: Secondary | ICD-10-CM | POA: Diagnosis not present

## 2018-07-16 DIAGNOSIS — K219 Gastro-esophageal reflux disease without esophagitis: Secondary | ICD-10-CM | POA: Insufficient documentation

## 2018-07-16 DIAGNOSIS — D631 Anemia in chronic kidney disease: Secondary | ICD-10-CM | POA: Insufficient documentation

## 2018-07-16 DIAGNOSIS — Z87891 Personal history of nicotine dependence: Secondary | ICD-10-CM | POA: Diagnosis not present

## 2018-07-16 DIAGNOSIS — G629 Polyneuropathy, unspecified: Secondary | ICD-10-CM | POA: Diagnosis not present

## 2018-07-16 DIAGNOSIS — Z01810 Encounter for preprocedural cardiovascular examination: Secondary | ICD-10-CM | POA: Diagnosis not present

## 2018-07-16 DIAGNOSIS — G2581 Restless legs syndrome: Secondary | ICD-10-CM | POA: Insufficient documentation

## 2018-07-16 DIAGNOSIS — I998 Other disorder of circulatory system: Secondary | ICD-10-CM | POA: Diagnosis not present

## 2018-07-16 DIAGNOSIS — I350 Nonrheumatic aortic (valve) stenosis: Secondary | ICD-10-CM | POA: Insufficient documentation

## 2018-07-16 DIAGNOSIS — E785 Hyperlipidemia, unspecified: Secondary | ICD-10-CM | POA: Insufficient documentation

## 2018-07-16 DIAGNOSIS — N186 End stage renal disease: Secondary | ICD-10-CM | POA: Diagnosis not present

## 2018-07-16 DIAGNOSIS — I70229 Atherosclerosis of native arteries of extremities with rest pain, unspecified extremity: Secondary | ICD-10-CM

## 2018-07-16 DIAGNOSIS — I70262 Atherosclerosis of native arteries of extremities with gangrene, left leg: Secondary | ICD-10-CM | POA: Diagnosis not present

## 2018-07-16 DIAGNOSIS — I2510 Atherosclerotic heart disease of native coronary artery without angina pectoris: Secondary | ICD-10-CM | POA: Insufficient documentation

## 2018-07-16 DIAGNOSIS — I7092 Chronic total occlusion of artery of the extremities: Secondary | ICD-10-CM | POA: Diagnosis not present

## 2018-07-16 DIAGNOSIS — I70201 Unspecified atherosclerosis of native arteries of extremities, right leg: Secondary | ICD-10-CM | POA: Diagnosis not present

## 2018-07-16 HISTORY — PX: ABDOMINAL AORTOGRAM W/LOWER EXTREMITY: CATH118223

## 2018-07-16 SURGERY — ABDOMINAL AORTOGRAM W/LOWER EXTREMITY
Anesthesia: LOCAL | Laterality: Bilateral

## 2018-07-16 MED ORDER — FENTANYL CITRATE (PF) 100 MCG/2ML IJ SOLN
INTRAMUSCULAR | Status: AC
  Filled 2018-07-16: qty 2

## 2018-07-16 MED ORDER — ONDANSETRON HCL 4 MG/2ML IJ SOLN: 4.0000 mg | Freq: Four times a day (QID) | INTRAMUSCULAR | Status: DC | PRN

## 2018-07-16 MED ORDER — ASPIRIN 81 MG PO CHEW: 81.0000 mg | CHEWABLE_TABLET | ORAL | Status: DC

## 2018-07-16 MED ORDER — SODIUM CHLORIDE 0.9 % IV SOLN: 250.0000 mL | INTRAVENOUS | Status: DC | PRN

## 2018-07-16 MED ORDER — SODIUM CHLORIDE 0.9% FLUSH: 3.0000 mL | INTRAVENOUS | Status: DC | PRN

## 2018-07-16 MED ORDER — HEPARIN (PORCINE) IN NACL 1000-0.9 UT/500ML-% IV SOLN
INTRAVENOUS | Status: AC
  Filled 2018-07-16: qty 1000

## 2018-07-16 MED ORDER — SODIUM CHLORIDE 0.9 % IV SOLN
INTRAVENOUS | Status: DC
  Administered 2018-07-16: 07:00:00 via INTRAVENOUS

## 2018-07-16 MED ORDER — ACETAMINOPHEN 325 MG PO TABS: 650.0000 mg | ORAL_TABLET | ORAL | Status: DC | PRN

## 2018-07-16 MED ORDER — LIDOCAINE HCL (PF) 1 % IJ SOLN
INTRAMUSCULAR | Status: AC
  Filled 2018-07-16: qty 30

## 2018-07-16 MED ORDER — HEPARIN (PORCINE) IN NACL 1000-0.9 UT/500ML-% IV SOLN
INTRAVENOUS | Status: DC | PRN
  Administered 2018-07-16: 500 mL

## 2018-07-16 MED ORDER — MIDAZOLAM HCL 2 MG/2ML IJ SOLN
INTRAMUSCULAR | Status: AC
  Filled 2018-07-16: qty 2

## 2018-07-16 MED ORDER — IODIXANOL 320 MG/ML IV SOLN
INTRAVENOUS | Status: DC | PRN
  Administered 2018-07-16: 110 mL via INTRA_ARTERIAL

## 2018-07-16 MED ORDER — LIDOCAINE HCL (PF) 1 % IJ SOLN
INTRAMUSCULAR | Status: DC | PRN
  Administered 2018-07-16: 15 mL

## 2018-07-16 MED ORDER — SODIUM CHLORIDE 0.9% FLUSH: 3.0000 mL | Freq: Two times a day (BID) | INTRAVENOUS | Status: DC

## 2018-07-16 MED ORDER — MIDAZOLAM HCL 2 MG/2ML IJ SOLN
INTRAMUSCULAR | Status: DC | PRN
  Administered 2018-07-16: 1 mg via INTRAVENOUS

## 2018-07-16 MED ORDER — FENTANYL CITRATE (PF) 100 MCG/2ML IJ SOLN
INTRAMUSCULAR | Status: DC | PRN
  Administered 2018-07-16: 50 ug via INTRAVENOUS

## 2018-07-16 SURGICAL SUPPLY — 13 items
CATH ANGIO 5F PIGTAIL 65CM (CATHETERS) ×1 IMPLANT
CATH CROSS OVER TEMPO 5F (CATHETERS) ×1 IMPLANT
CATH STRAIGHT 5FR 65CM (CATHETERS) ×1 IMPLANT
CLOSURE MYNX CONTROL 5F (Vascular Products) ×1 IMPLANT
KIT MICROPUNCTURE NIT STIFF (SHEATH) ×1 IMPLANT
KIT PV (KITS) ×2 IMPLANT
SHEATH PINNACLE 5F 10CM (SHEATH) ×1 IMPLANT
SHEATH PROBE COVER 6X72 (BAG) ×1 IMPLANT
SYR MEDRAD MARK 7 150ML (SYRINGE) ×2 IMPLANT
TRANSDUCER W/STOPCOCK (MISCELLANEOUS) ×2 IMPLANT
TRAY PV CATH (CUSTOM PROCEDURE TRAY) ×2 IMPLANT
WIRE HITORQ VERSACORE ST 145CM (WIRE) ×1 IMPLANT
WIRE TORQFLEX AUST .018X40CM (WIRE) ×1 IMPLANT

## 2018-07-16 NOTE — Discharge Instructions (Signed)

## 2018-07-16 NOTE — Consult Note (Addendum)
Hospital Consult    Reason for Consult:  Gangrenous toes Requesting Physician:  Fletcher Anon MRN #:  [id number]  History of Present Illness: This is a 68 y.o. male who was seen by Dr. Fletcher Anon due to abnormal vascular studies. This reveals his SFA bilaterally has an occlusion as well as the left popliteal artery and TP trunk occlusion.   He has some gangrenous toes on the left that he states has been going on for a couple of weeks.  Over the past couple of months, he has color changes in the left foot.  He walks with a cane.   Pt has hx of amyloid that is followed by Oncology.  He has ESRD and dialyzes M/W/F at the [facility] location.  His most recent echo revealed an LVEF of 65-70% with moderate AS.   He has CAD that is being treated medically.  The pt is on a statin for cholesterol management.  The pt is on a daily aspirin.   Other AC:  Plavix The pt is not on medication for hypertension.   The pt is not diabetic.   Tobacco hx:  remote  Past Medical History:  Diagnosis Date  . Amyloidosis ([place])   . Anemia Dx 2016  . CAD (coronary artery disease)    a. cardiac cath on 11/25/17 with occlusion of the RCA which could have been his event but the RCA could not be engaged. The RCA filled distally from left to right collaterals. There were no severe stenoses in the left coronary system. Medical therapy recommended, started on plavix.  . Chronic combined systolic and diastolic CHF (congestive heart failure) ([place])    a. EF dropped to 35-40% with grade 2 DD by echo 11/2017.  [name] Diarrhea   . ESRD (end stage renal disease) ([place])   . Family history of adverse reaction to anesthesia    "my daughter can't take certain anesthesia agents" (09/08/2014)  . GERD (gastroesophageal reflux disease)   . History of blood transfusion 09/08/2014   "got hematoma after renal biopsy & HgB dropped"  . Hyperlipidemia Dx 2012  . Hypertension Dx 2012  . Restless legs     Past Surgical History:  Procedure Laterality Date   . AV FISTULA PLACEMENT Right 12/27/2015   Procedure: Right Arm ARTERIOVENOUS (AV) FISTULA CREATION;  Surgeon: Angelia Mould, MD;  Location: [place];  Service: Vascular;  Laterality: Right;  . COLONOSCOPY    . IR GENERIC HISTORICAL  01/27/2016   IR FLUORO GUIDE CV LINE RIGHT 01/27/2016 Arne Cleveland, MD [facility]  . IR GENERIC HISTORICAL  01/27/2016   IR US GUIDE VASC ACCESS RIGHT 01/27/2016 Arne Cleveland, MD [facility]  . LAPAROSCOPIC CHOLECYSTECTOMY  03/2011  . LEFT HEART CATH AND CORONARY ANGIOGRAPHY N/A 11/25/2017   Procedure: LEFT HEART CATH AND CORONARY ANGIOGRAPHY;  Surgeon: Martinique, Peter M, MD;  Location: [place] [facility];  Service: Cardiovascular;  Laterality: N/A;  . RENAL BIOPSY, PERCUTANEOUS Right 09/08/2014  . RIGHT/LEFT HEART CATH AND CORONARY ANGIOGRAPHY N/A 03/25/2018   Procedure: RIGHT/LEFT HEART CATH AND CORONARY ANGIOGRAPHY;  Surgeon: Martinique, Peter M, MD;  Location: [place] [facility];  Service: Cardiovascular;  Laterality: N/A;  . TONSILLECTOMY  ~ 1960  . ULTRASOUND GUIDANCE FOR VASCULAR ACCESS  03/25/2018   Procedure: Ultrasound Guidance For Vascular Access;  Surgeon: Martinique, Peter M, MD;  Location: [place] [facility];  Service: Cardiovascular;;    Allergies  Allergen Reactions  . No Known Allergies     Prior  to Admission medications   Medication Sig Start Date End Date Taking? Authorizing Provider  acetaminophen (TYLENOL) 500 MG tablet Take 500-1,000 mg by mouth every 6 (six) hours as needed (pain).   Yes [provider]  aspirin EC 81 MG tablet Take 1 tablet (81 mg total) by mouth daily. 11/30/17  Yes Cheryln Manly, NP  atorvastatin (LIPITOR) 80 MG tablet Take 80 mg by mouth daily at 6 PM.    Yes [provider]  B Complex-C-Folic Acid (RENA-VITE PO) Take 1 tablet by mouth daily.   Yes [provider]  clopidogrel (PLAVIX) 75 MG tablet Take 1 tablet (75 mg total) by mouth daily. 12/20/17  Yes Dunn, Dayna N, PA-C  ethyl  chloride spray Apply 1 application topically See admin instructions. Spray on arm immediately prior to dialysis on Monday, Wednesday and Friday   Yes [provider]  gabapentin (NEURONTIN) 300 MG capsule TAKE 1 CAPSULE BY MOUTH EVERY MORNING AND 3 CAPSULES EACH NIGHT AT BEDTIME Patient taking differently: Take 300-900 mg by mouth See admin instructions. Take 1 capsule (300 mg) by mouth in the morning & take 3 capsules (900 mg) by mouth at bedtime 01/07/18  Yes Cameron Sprang, MD  oxyCODONE (OXY IR/ROXICODONE) 5 MG immediate release tablet Take 1-2 tablets (5-10 mg total) by mouth every 6 (six) hours as needed for severe pain. 10/02/17  Yes [name], MD  oxyCODONE (OXYCONTIN) 10 mg 12 hr tablet Take 10 mg by mouth daily as needed (severe pain).    Yes [provider]  sucroferric oxyhydroxide (VELPHORO) 500 MG chewable tablet Chew 500-1,000 mg by mouth See admin instructions. Take 1 tablet (500 mg) by mouth with a small meal or snack & take 2 tablets (1000 mg) by mouth with large meals   Yes [provider]  vitamin B-12 (CYANOCOBALAMIN) 1000 MCG tablet Take 1,000 mcg by mouth daily.   Yes [provider]  cyclobenzaprine (FLEXERIL) 5 MG tablet Take every 8 hours as needed for muscle spasms Patient not taking: Reported on 07/11/2018 01/07/18   Cameron Sprang, MD    Social History   Socioeconomic History  . Marital status: Divorced    Spouse name: Not on file  . Number of children: Not on file  . Years of education: Not on file  . Highest education level: Not on file  Occupational History  . Not on file  Social Needs  . Financial resource strain: Not on file  . Food insecurity:    Worry: Not on file    Inability: Not on file  . Transportation needs:    Medical: Not on file    Non-medical: Not on file  Tobacco Use  . Smoking status: Former Smoker    Packs/day: 2.00    Years: 40.00    Pack years: 80.00    Types: Cigarettes    Last attempt  to quit: 03/19/2011    Years since quitting: 7.3  . Smokeless tobacco: Never Used  Substance and Sexual Activity  . Alcohol use: No    Alcohol/week: 0.0 standard drinks  . Drug use: No    Comment: 09/08/2014 "I have used marijuana till the 1990's". Notes that he quit 15 yrs ago  . Sexual activity: Not Currently  Lifestyle  . Physical activity:    Days per week: Not on file    Minutes per session: Not on file  . Stress: Not on file  Relationships  . Social connections:  Talks on phone: Not on file    Gets together: Not on file    Attends religious service: Not on file    Active member of club or organization: Not on file    Attends meetings of clubs or organizations: Not on file    Relationship status: Not on file  . Intimate partner violence:    Fear of current or ex partner: Not on file    Emotionally abused: Not on file    Physically abused: Not on file    Forced sexual activity: Not on file  Other Topics Concern  . 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      Family History  Problem Relation Age of Onset  . Hypertension Mother   . Diabetes Mother   . Heart disease Mother   . Skin cancer Mother   . Hypertension Father   . Diabetes Father   . Diabetes Sister     ROS: [x]  Positive   [ ]  Negative   [ ]  All sytems reviewed and are negative  Cardiac: [x]  CHF [x]  CAD [x]  high blood pressure  Vascular: []  pain in legs while walking []  pain in legs at rest [x]  pain in legs at night-keeps him from going to sleep [x]  non-healing ulcers  Pulmonary: []  productive cough []  asthma/wheezing []  home O2  Neurologic: []  weakness in []  arms []  legs []  numbness in []  arms []  legs []  hx of CVA []  mini stroke [] difficulty speaking or slurred speech []  temporary loss of vision in one eye []  dizziness  Hematologic: []  hx of  cancer []  bleeding problems []  problems with blood clotting easily  Endocrine:   []  diabetes []  thyroid disease  GI [x]  GERD  GU: [x]  CKD/renal failure [x]  HD--[x]  M/W/F or []  T/T/S  Psychiatric: []  anxiety []  depression  Musculoskeletal: []  arthritis []  joint pain  Integumentary: []  rashes [x]  ulcers  Constitutional: []  fever []  chills   Physical Examination  Vitals:   07/16/18 0950 07/16/18 1020  BP: 140/63 (!) 120/52  Pulse: 81 86  Resp:    Temp:    SpO2: 95% 96%   There is no height or weight on file to calculate BMI.  General:  WDWN in NAD Gait: Not observed HENT: WNL, normocephalic Pulmonary: normal non-labored breathing, without Rales, rhonchi,  wheezing Cardiac: regular Skin: without rashes Vascular Exam/Pulses: Bilateral 2+ palpable femoral pulses Pedal pulses are not palpable Extremities:   Right foot 07/16/2018 ri Right plantar foot 07/16/2018  Left foot 07/16/2018  Musculoskeletal: no muscle wasting or atrophy  Neurologic: A&O X 3;  No focal weakness or paresthesias are detected; speech is fluent/normal Psychiatric:  The pt has Normal affect.   CBC    Component Value Date/Time   WBC 6.7 07/14/2018 0909   WBC 6.2 03/26/2018 0345   RBC 2.92 (L) 07/14/2018 0909   RBC 2.76 (L) 03/26/2018 0345   HGB 9.5 (L) 07/14/2018 0909   HGB 10.8 (L) 03/14/2017 0921   HCT 29.3 (L) 07/14/2018 0909   HCT 32.5 (L) 03/14/2017 0921   PLT 115 (L) 07/14/2018 0909   MCV 100 (H) 07/14/2018 0909   MCV 106.2 (H) 03/14/2017 0921   MCH 32.5 07/14/2018 0909   MCH 31.5 03/26/2018 0345   MCHC 32.4 07/14/2018 0909   MCHC 33.0 03/26/2018 0345  RDW 15.6 (H) 07/14/2018 0909   RDW 14.6 03/14/2017 0921   LYMPHSABS 1.3 03/23/2018 1620   LYMPHSABS 0.8 (L) 03/14/2017 0921   MONOABS 0.5 03/23/2018 1620   MONOABS 0.6 03/14/2017 0921   EOSABS 0.2 03/23/2018 1620   EOSABS 0.1 03/14/2017 0921   BASOSABS 0.0 03/23/2018 1620   BASOSABS 0.0 03/14/2017 0921    BMET     Component Value Date/Time   NA 135 07/14/2018 0909   NA 141 03/14/2017 0921   K 5.4 (H) 07/14/2018 0909   K 4.1 03/14/2017 0921   CL 93 (L) 07/14/2018 0909   CO2 27 07/14/2018 0909   CO2 33 (H) 03/14/2017 0921   GLUCOSE 155 (H) 07/14/2018 0909   GLUCOSE 91 03/26/2018 0756   GLUCOSE 144 (H) 03/14/2017 0921   BUN 76 (HH) 07/14/2018 0909   BUN 30.1 (H) 03/14/2017 0921   CREATININE 13.07 (H) 07/14/2018 0909   CREATININE 9.16 (HH) 09/17/2017 0954   CREATININE 8.1 (HH) 03/14/2017 0921   CALCIUM 9.4 07/14/2018 0909   CALCIUM 9.5 03/14/2017 0921   GFRNONAA 3 (L) 07/14/2018 0909   GFRNONAA 5 (L) 09/17/2017 0954   GFRNONAA 24 (L) 06/30/2014 1528   GFRAA 4 (L) 07/14/2018 0909   GFRAA 6 (L) 09/17/2017 0954   GFRAA 28 (L) 06/30/2014 1528    COAGS: Lab Results  Component Value Date   INR 1.27 01/23/2016   INR 1.10 (L) 01/21/2015   INR 1.42 10/11/2014   PROTIME 13.2 01/21/2015     Invasive Vascular Imaging:   Aortogram 07/16/2018: 1.  Mild nonobstructive aortoiliac disease. 2.  Left lower extremity: Flush occlusion of the SFA with reconstitution distally via collaterals from the profunda followed by significant diffuse disease affecting the popliteal artery with two-vessel runoff below the knee. 3.  Right lower extremity: Flush occlusion of the SFA with reconstitution distally via collaterals from the profunda with moderate popliteal disease and two-vessel runoff below the knee.  Recommendations: The patient has gangrenous changes only on the left side affecting 2 toes.  His best option is likely femoral to below the knee bypass if he is deemed to be a surgical candidate.  I will discuss with vascular surgery and also with the patient's primary cardiologist; Dr. Angelena Form.   ASSESSMENT/PLAN: This is a 69 y.o. male with gangrenous toes in setting of critical limb ischemia  -will plan for femoral to popliteal (left) bypass grafting in the near future.   -will order vein mapping prior  to pt's discharge today. -our office will make arrangements for surgery.    Leontine Locket, PA-C [facility] [phone]  I have examined the patient, reviewed and agree with above.  Critical limb ischemia bilaterally with gangrenous changes in his left foot.  Reviewed arteriogram and discussed with the patient.  Flush occlusion of the superficial femoral arteries bilaterally with reconstitution of the popliteals bilaterally.  More significant tibial disease on the left than on the right but still good runoff bilaterally.  Have recommended left femoral to popliteal bypass.  Suspect that he will eventually require staged right femoropopliteal once he is recovered from this.  Does have severe ischemia in his left foot as well but no gangrenous changes.  He has hemodialysis on Monday Wednesday and Friday.  We will do vein mapping today prior to discharge to home and plan left femoral to popliteal bypass with Dr. Oneida Alar on 07/22/2018  Curt Jews, MD 07/16/2018 12:12 PM

## 2018-07-16 NOTE — Progress Notes (Signed)
Called pt's daughter [name] on speaker in pt's room and went over discharge instructions with pt and daughter.  Written instructions given to pt.  All questions answered.

## 2018-07-16 NOTE — Progress Notes (Signed)
Bilateral lower extremity vein mapping completed. Refer to "CV Proc" under chart review to view preliminary results.  07/16/2018 2:58 PM Maudry Mayhew, MHA, RVT, RDCS, RDMS

## 2018-07-16 NOTE — Interval H&P Note (Signed)
History and Physical Interval Note:  07/16/2018 8:44 AM  Jose Garrett  has presented today for surgery, with the diagnosis of Peripheral Artery Disease.  The various methods of treatment have been discussed with the patient and family. After consideration of risks, benefits and other options for treatment, the patient has consented to  Procedure(s): ABDOMINAL AORTOGRAM W/LOWER EXTREMITY (Bilateral) as a surgical intervention.  The patient's history has been reviewed, patient examined, no change in status, stable for surgery.  I have reviewed the patient's chart and labs.  Questions were answered to the patient's satisfaction.     Kathlyn Sacramento

## 2018-07-17 ENCOUNTER — Other Ambulatory Visit: Payer: Self-pay | Admitting: Vascular Surgery

## 2018-07-17 ENCOUNTER — Encounter (HOSPITAL_COMMUNITY): Payer: Self-pay | Admitting: Cardiovascular Disease

## 2018-07-18 DIAGNOSIS — N186 End stage renal disease: Secondary | ICD-10-CM | POA: Diagnosis not present

## 2018-07-18 DIAGNOSIS — N2581 Secondary hyperparathyroidism of renal origin: Secondary | ICD-10-CM | POA: Diagnosis not present

## 2018-07-21 ENCOUNTER — Other Ambulatory Visit (HOSPITAL_COMMUNITY)
Admission: RE | Admit: 2018-07-21 | Discharge: 2018-07-21 | Disposition: A | Discharge status: Home / Self Care | Payer: Medicare Other | Source: Ambulatory Visit | Attending: Vascular Surgery | Admitting: Vascular Surgery

## 2018-07-21 ENCOUNTER — Other Ambulatory Visit: Payer: Self-pay

## 2018-07-21 ENCOUNTER — Encounter (HOSPITAL_COMMUNITY): Payer: Self-pay | Admitting: *Deleted

## 2018-07-21 DIAGNOSIS — N2581 Secondary hyperparathyroidism of renal origin: Secondary | ICD-10-CM | POA: Diagnosis not present

## 2018-07-21 DIAGNOSIS — Z1159 Encounter for screening for other viral diseases: Secondary | ICD-10-CM | POA: Insufficient documentation

## 2018-07-21 DIAGNOSIS — N186 End stage renal disease: Secondary | ICD-10-CM | POA: Diagnosis not present

## 2018-07-21 LAB — SARS CORONAVIRUS 2 BY RT PCR (HOSPITAL ORDER, PERFORMED IN ~~LOC~~ HOSPITAL LAB): SARS Coronavirus 2: NEGATIVE

## 2018-07-21 NOTE — Anesthesia Preprocedure Evaluation (Addendum)
Anesthesia Evaluation  Patient identified by MRN, date of birth, ID band Patient awake    Reviewed: Allergy & Precautions, H&P , NPO status , Patient's Chart, lab work & pertinent test results  Airway Mallampati: II  TM Distance: >3 FB Neck ROM: Full    Dental no notable dental hx. (+) Edentulous Upper, Edentulous Lower, Dental Advisory Given   Pulmonary neg pulmonary ROS, former smoker,    Pulmonary exam normal breath sounds clear to auscultation       Cardiovascular hypertension, Pt. on medications + CAD, + Past MI, + Peripheral Vascular Disease and +CHF  + Valvular Problems/Murmurs AS  Rhythm:Regular Rate:Normal     Neuro/Psych Anxiety negative neurological ROS     GI/Hepatic Neg liver ROS, GERD  Controlled,  Endo/Other  negative endocrine ROS  Renal/GU ESRF and DialysisRenal disease  negative genitourinary   Musculoskeletal   Abdominal   Peds  Hematology  (+) Blood dyscrasia, anemia ,   Anesthesia Other Findings   Reproductive/Obstetrics negative OB ROS                          Anesthesia Physical Anesthesia Plan  ASA: III  Anesthesia Plan: General   Post-op Pain Management:    Induction: Intravenous  PONV Risk Score and Plan: 3 and Ondansetron, Dexamethasone and Midazolam  Airway Management Planned: Oral ETT  Additional Equipment:   Intra-op Plan:   Post-operative Plan: Extubation in OR  Informed Consent: I have reviewed the patients History and Physical, chart, labs and discussed the procedure including the risks, benefits and alternatives for the proposed anesthesia with the patient or authorized representative who has indicated his/her understanding and acceptance.     Dental advisory given  Plan Discussed with: CRNA, Anesthesiologist and Surgeon  Anesthesia Plan Comments: (Follows with cardiology for hx of CAD and Systolic CHF. Recently admitted 1/12-1/15/20 for  NSTEMI. Echo showed decreased LV function from previous in December with EF 40-45% and similar moderate AS with mean gradient 30 mm Hg. Cardiology was consulted and performed heart cath. Imaging revealed single vessel occlusive CAD with 100% ostial RCA, left to right collaterals to the RCA. Moderate stenosis at the ostium of the LCx. These findings are unchanged from September 2019. No interventions were initiated. No further cardiac workup was indicated at this time per cardiology.  He was seen by Dr. Fletcher Anon 07/09/18 at which time Dr Fletcher Anon diagnosed critical limb ischemia and referred to vascular surgery. Per [facility] note, he is being treated medically for CAD with no significant angina. His heart failure is also stable.   )      Anesthesia Quick Evaluation

## 2018-07-21 NOTE — Progress Notes (Signed)
Jose Garrett denies chest pain or shortness of breath. Patient denies that he or his family has experienced any of the following: Cough Fever >100.4 Runny Nose Sore Throat Difficulty breathing/ shortness of breath Travel in past 14 days- none  Jose Garrett will have COVID Screen done this am at [facility].

## 2018-07-22 ENCOUNTER — Encounter (HOSPITAL_COMMUNITY)
Admission: RE | Disposition: A | Discharge status: Rehabilitation Facility | Payer: Self-pay | Source: Home / Self Care | Attending: Vascular Surgery

## 2018-07-22 ENCOUNTER — Inpatient Hospital Stay (HOSPITAL_COMMUNITY)
Admission: RE | Admit: 2018-07-22 | Discharge: 2018-07-28 | DRG: 252 | Disposition: A | Discharge status: Rehabilitation Facility | Payer: Medicare Other | Attending: Vascular Surgery | Admitting: Vascular Surgery

## 2018-07-22 ENCOUNTER — Other Ambulatory Visit: Payer: Self-pay

## 2018-07-22 ENCOUNTER — Inpatient Hospital Stay (HOSPITAL_COMMUNITY): Payer: Medicare Other | Admitting: Physician Assistant

## 2018-07-22 ENCOUNTER — Encounter (HOSPITAL_COMMUNITY): Payer: Self-pay

## 2018-07-22 DIAGNOSIS — Z8249 Family history of ischemic heart disease and other diseases of the circulatory system: Secondary | ICD-10-CM

## 2018-07-22 DIAGNOSIS — R5381 Other malaise: Secondary | ICD-10-CM | POA: Diagnosis not present

## 2018-07-22 DIAGNOSIS — Z79891 Long term (current) use of opiate analgesic: Secondary | ICD-10-CM

## 2018-07-22 DIAGNOSIS — E8889 Other specified metabolic disorders: Secondary | ICD-10-CM | POA: Diagnosis present

## 2018-07-22 DIAGNOSIS — I5032 Chronic diastolic (congestive) heart failure: Secondary | ICD-10-CM | POA: Diagnosis not present

## 2018-07-22 DIAGNOSIS — D696 Thrombocytopenia, unspecified: Secondary | ICD-10-CM | POA: Diagnosis not present

## 2018-07-22 DIAGNOSIS — I255 Ischemic cardiomyopathy: Secondary | ICD-10-CM | POA: Diagnosis present

## 2018-07-22 DIAGNOSIS — I5042 Chronic combined systolic (congestive) and diastolic (congestive) heart failure: Secondary | ICD-10-CM | POA: Diagnosis not present

## 2018-07-22 DIAGNOSIS — I96 Gangrene, not elsewhere classified: Secondary | ICD-10-CM | POA: Diagnosis present

## 2018-07-22 DIAGNOSIS — I953 Hypotension of hemodialysis: Secondary | ICD-10-CM | POA: Diagnosis not present

## 2018-07-22 DIAGNOSIS — I2510 Atherosclerotic heart disease of native coronary artery without angina pectoris: Secondary | ICD-10-CM | POA: Diagnosis present

## 2018-07-22 DIAGNOSIS — E861 Hypovolemia: Secondary | ICD-10-CM | POA: Diagnosis not present

## 2018-07-22 DIAGNOSIS — Z992 Dependence on renal dialysis: Secondary | ICD-10-CM | POA: Diagnosis not present

## 2018-07-22 DIAGNOSIS — N2581 Secondary hyperparathyroidism of renal origin: Secondary | ICD-10-CM | POA: Diagnosis present

## 2018-07-22 DIAGNOSIS — I70245 Atherosclerosis of native arteries of left leg with ulceration of other part of foot: Secondary | ICD-10-CM | POA: Diagnosis not present

## 2018-07-22 DIAGNOSIS — I739 Peripheral vascular disease, unspecified: Secondary | ICD-10-CM | POA: Diagnosis not present

## 2018-07-22 DIAGNOSIS — Z808 Family history of malignant neoplasm of other organs or systems: Secondary | ICD-10-CM

## 2018-07-22 DIAGNOSIS — R0602 Shortness of breath: Secondary | ICD-10-CM | POA: Diagnosis not present

## 2018-07-22 DIAGNOSIS — Z833 Family history of diabetes mellitus: Secondary | ICD-10-CM

## 2018-07-22 DIAGNOSIS — I214 Non-ST elevation (NSTEMI) myocardial infarction: Secondary | ICD-10-CM | POA: Diagnosis not present

## 2018-07-22 DIAGNOSIS — I9589 Other hypotension: Secondary | ICD-10-CM | POA: Diagnosis not present

## 2018-07-22 DIAGNOSIS — K219 Gastro-esophageal reflux disease without esophagitis: Secondary | ICD-10-CM | POA: Diagnosis present

## 2018-07-22 DIAGNOSIS — E785 Hyperlipidemia, unspecified: Secondary | ICD-10-CM | POA: Diagnosis present

## 2018-07-22 DIAGNOSIS — Z79899 Other long term (current) drug therapy: Secondary | ICD-10-CM | POA: Diagnosis not present

## 2018-07-22 DIAGNOSIS — Z7902 Long term (current) use of antithrombotics/antiplatelets: Secondary | ICD-10-CM

## 2018-07-22 DIAGNOSIS — I4510 Unspecified right bundle-branch block: Secondary | ICD-10-CM | POA: Diagnosis present

## 2018-07-22 DIAGNOSIS — I998 Other disorder of circulatory system: Secondary | ICD-10-CM | POA: Diagnosis present

## 2018-07-22 DIAGNOSIS — G2581 Restless legs syndrome: Secondary | ICD-10-CM | POA: Diagnosis present

## 2018-07-22 DIAGNOSIS — I132 Hypertensive heart and chronic kidney disease with heart failure and with stage 5 chronic kidney disease, or end stage renal disease: Secondary | ICD-10-CM | POA: Diagnosis present

## 2018-07-22 DIAGNOSIS — K5900 Constipation, unspecified: Secondary | ICD-10-CM | POA: Diagnosis not present

## 2018-07-22 DIAGNOSIS — E8581 Light chain (AL) amyloidosis: Secondary | ICD-10-CM | POA: Diagnosis present

## 2018-07-22 DIAGNOSIS — I350 Nonrheumatic aortic (valve) stenosis: Secondary | ICD-10-CM

## 2018-07-22 DIAGNOSIS — L97523 Non-pressure chronic ulcer of other part of left foot with necrosis of muscle: Secondary | ICD-10-CM | POA: Diagnosis not present

## 2018-07-22 DIAGNOSIS — D631 Anemia in chronic kidney disease: Secondary | ICD-10-CM | POA: Diagnosis not present

## 2018-07-22 DIAGNOSIS — N186 End stage renal disease: Secondary | ICD-10-CM | POA: Diagnosis present

## 2018-07-22 DIAGNOSIS — D62 Acute posthemorrhagic anemia: Secondary | ICD-10-CM | POA: Diagnosis not present

## 2018-07-22 DIAGNOSIS — D72829 Elevated white blood cell count, unspecified: Secondary | ICD-10-CM | POA: Diagnosis not present

## 2018-07-22 DIAGNOSIS — I248 Other forms of acute ischemic heart disease: Secondary | ICD-10-CM | POA: Diagnosis not present

## 2018-07-22 DIAGNOSIS — D649 Anemia, unspecified: Secondary | ICD-10-CM | POA: Diagnosis not present

## 2018-07-22 DIAGNOSIS — I252 Old myocardial infarction: Secondary | ICD-10-CM

## 2018-07-22 DIAGNOSIS — E875 Hyperkalemia: Secondary | ICD-10-CM | POA: Diagnosis present

## 2018-07-22 DIAGNOSIS — I352 Nonrheumatic aortic (valve) stenosis with insufficiency: Secondary | ICD-10-CM | POA: Diagnosis present

## 2018-07-22 DIAGNOSIS — R7989 Other specified abnormal findings of blood chemistry: Secondary | ICD-10-CM | POA: Diagnosis not present

## 2018-07-22 DIAGNOSIS — I120 Hypertensive chronic kidney disease with stage 5 chronic kidney disease or end stage renal disease: Secondary | ICD-10-CM | POA: Diagnosis not present

## 2018-07-22 DIAGNOSIS — I25118 Atherosclerotic heart disease of native coronary artery with other forms of angina pectoris: Secondary | ICD-10-CM | POA: Diagnosis not present

## 2018-07-22 DIAGNOSIS — Z87891 Personal history of nicotine dependence: Secondary | ICD-10-CM

## 2018-07-22 DIAGNOSIS — R0789 Other chest pain: Secondary | ICD-10-CM

## 2018-07-22 DIAGNOSIS — K5903 Drug induced constipation: Secondary | ICD-10-CM | POA: Diagnosis not present

## 2018-07-22 DIAGNOSIS — Z7982 Long term (current) use of aspirin: Secondary | ICD-10-CM

## 2018-07-22 HISTORY — DX: Cardiac murmur, unspecified: R01.1

## 2018-07-22 HISTORY — PX: FEMORAL-POPLITEAL BYPASS GRAFT: SHX937

## 2018-07-22 HISTORY — DX: Peripheral vascular disease, unspecified: I73.9

## 2018-07-22 HISTORY — DX: Anxiety disorder, unspecified: F41.9

## 2018-07-22 HISTORY — DX: Polyneuropathy, unspecified: G62.9

## 2018-07-22 LAB — SURGICAL PCR SCREEN
MRSA, PCR: NEGATIVE
Staphylococcus aureus: NEGATIVE

## 2018-07-22 LAB — APTT: aPTT: 45 seconds — ABNORMAL HIGH (ref 24–36)

## 2018-07-22 LAB — COMPREHENSIVE METABOLIC PANEL
ALT: 14 U/L (ref 0–44)
AST: 16 U/L (ref 15–41)
Albumin: 3.4 g/dL — ABNORMAL LOW (ref 3.5–5.0)
Alkaline Phosphatase: 55 U/L (ref 38–126)
Anion gap: 25 — ABNORMAL HIGH (ref 5–15)
BUN: 30 mg/dL — ABNORMAL HIGH (ref 8–23)
CO2: 25 mmol/L (ref 22–32)
Calcium: 9.1 mg/dL (ref 8.9–10.3)
Chloride: 89 mmol/L — ABNORMAL LOW (ref 98–111)
Creatinine, Ser: 7.72 mg/dL — ABNORMAL HIGH (ref 0.61–1.24)
GFR calc Af Amer: 8 mL/min — ABNORMAL LOW (ref >60)
GFR calc non Af Amer: 7 mL/min — ABNORMAL LOW (ref >60)
Glucose, Bld: 118 mg/dL — ABNORMAL HIGH (ref 70–99)
Potassium: 4 mmol/L (ref 3.5–5.1)
Sodium: 139 mmol/L (ref 135–145)
Total Bilirubin: 0.7 mg/dL (ref 0.3–1.2)
Total Protein: 6.8 g/dL (ref 6.5–8.1)

## 2018-07-22 LAB — CBC
HCT: 29.8 % — ABNORMAL LOW (ref 39.0–52.0)
Hemoglobin: 9.7 g/dL — ABNORMAL LOW (ref 13.0–17.0)
MCH: 33.2 pg (ref 26.0–34.0)
MCHC: 32.6 g/dL (ref 30.0–36.0)
MCV: 102.1 fL — ABNORMAL HIGH (ref 80.0–100.0)
Platelets: 134 10*3/uL — ABNORMAL LOW (ref 150–400)
RBC: 2.92 MIL/uL — ABNORMAL LOW (ref 4.22–5.81)
RDW: 14.5 % (ref 11.5–15.5)
WBC: 5.5 10*3/uL (ref 4.0–10.5)
nRBC: 0 % (ref 0.0–0.2)

## 2018-07-22 LAB — PROTIME-INR
INR: 1.2 (ref 0.8–1.2)
Prothrombin Time: 15.4 seconds — ABNORMAL HIGH (ref 11.4–15.2)

## 2018-07-22 SURGERY — BYPASS GRAFT FEMORAL-POPLITEAL ARTERY
Anesthesia: General | Site: Leg Upper | Laterality: Left

## 2018-07-22 MED ORDER — PHENOL 1.4 % MT LIQD: 1.0000 | OROMUCOSAL | Status: DC | PRN

## 2018-07-22 MED ORDER — SODIUM CHLORIDE 0.9 % IV SOLN
INTRAVENOUS | Status: DC
  Administered 2018-07-22 (×2): via INTRAVENOUS

## 2018-07-22 MED ORDER — CHLORHEXIDINE GLUCONATE CLOTH 2 % EX PADS: 6.0000 | MEDICATED_PAD | Freq: Once | CUTANEOUS | Status: DC

## 2018-07-22 MED ORDER — BISACODYL 10 MG RE SUPP: 10.0000 mg | Freq: Every day | RECTAL | Status: DC | PRN

## 2018-07-22 MED ORDER — MAGNESIUM SULFATE 2 GM/50ML IV SOLN: 2.0000 g | Freq: Every day | INTRAVENOUS | Status: DC | PRN

## 2018-07-22 MED ORDER — VITAMIN B-12 1000 MCG PO TABS
1000.0000 ug | ORAL_TABLET | Freq: Every day | ORAL | Status: DC
  Administered 2018-07-22 – 2018-07-28 (×5): 1000 ug via ORAL
  Filled 2018-07-22 (×5): qty 1

## 2018-07-22 MED ORDER — GABAPENTIN 300 MG PO CAPS
300.0000 mg | ORAL_CAPSULE | Freq: Every morning | ORAL | Status: DC
  Administered 2018-07-25 – 2018-07-28 (×4): 300 mg via ORAL
  Filled 2018-07-22 (×4): qty 1

## 2018-07-22 MED ORDER — HEMOSTATIC AGENTS (NO CHARGE) OPTIME
TOPICAL | Status: DC | PRN
  Administered 2018-07-22 (×2): 1 via TOPICAL

## 2018-07-22 MED ORDER — MIDAZOLAM HCL 5 MG/5ML IJ SOLN
INTRAMUSCULAR | Status: DC | PRN
  Administered 2018-07-22: 2 mg via INTRAVENOUS

## 2018-07-22 MED ORDER — SODIUM CHLORIDE 0.9 % IV SOLN
INTRAVENOUS | Status: AC
  Filled 2018-07-22: qty 1.2

## 2018-07-22 MED ORDER — ONDANSETRON HCL 4 MG/2ML IJ SOLN
INTRAMUSCULAR | Status: AC
  Filled 2018-07-22: qty 2

## 2018-07-22 MED ORDER — LABETALOL HCL 5 MG/ML IV SOLN: 10.0000 mg | INTRAVENOUS | Status: DC | PRN

## 2018-07-22 MED ORDER — ALBUMIN HUMAN 5 % IV SOLN
INTRAVENOUS | Status: DC | PRN
  Administered 2018-07-22: 09:00:00 via INTRAVENOUS

## 2018-07-22 MED ORDER — POLYETHYLENE GLYCOL 3350 17 G PO PACK: 17.0000 g | PACK | Freq: Every day | ORAL | Status: DC | PRN

## 2018-07-22 MED ORDER — SODIUM CHLORIDE 0.9 % IV SOLN
INTRAVENOUS | Status: DC | PRN
  Administered 2018-07-22: 25 ug/min via INTRAVENOUS

## 2018-07-22 MED ORDER — HEPARIN SODIUM (PORCINE) 1000 UNIT/ML IJ SOLN
INTRAMUSCULAR | Status: AC
  Filled 2018-07-22: qty 1

## 2018-07-22 MED ORDER — GUAIFENESIN-DM 100-10 MG/5ML PO SYRP: 15.0000 mL | ORAL_SOLUTION | ORAL | Status: DC | PRN

## 2018-07-22 MED ORDER — MIDAZOLAM HCL 2 MG/2ML IJ SOLN
INTRAMUSCULAR | Status: AC
  Filled 2018-07-22: qty 2

## 2018-07-22 MED ORDER — ACETAMINOPHEN 325 MG RE SUPP: 325.0000 mg | RECTAL | Status: DC | PRN

## 2018-07-22 MED ORDER — METOPROLOL TARTRATE 5 MG/5ML IV SOLN: 2.0000 mg | INTRAVENOUS | Status: DC | PRN

## 2018-07-22 MED ORDER — PHENYLEPHRINE 40 MCG/ML (10ML) SYRINGE FOR IV PUSH (FOR BLOOD PRESSURE SUPPORT)
PREFILLED_SYRINGE | INTRAVENOUS | Status: DC | PRN
  Administered 2018-07-22: 80 ug via INTRAVENOUS
  Administered 2018-07-22 (×2): 40 ug via INTRAVENOUS

## 2018-07-22 MED ORDER — ASPIRIN EC 81 MG PO TBEC
81.0000 mg | DELAYED_RELEASE_TABLET | Freq: Every day | ORAL | Status: DC
  Administered 2018-07-22 – 2018-07-28 (×5): 81 mg via ORAL
  Filled 2018-07-22 (×5): qty 1

## 2018-07-22 MED ORDER — 0.9 % SODIUM CHLORIDE (POUR BTL) OPTIME
TOPICAL | Status: DC | PRN
  Administered 2018-07-22: 08:00:00 1000 mL

## 2018-07-22 MED ORDER — SODIUM CHLORIDE 0.9 % IV SOLN
62.5000 mg | INTRAVENOUS | Status: DC
  Administered 2018-07-23: 62.5 mg via INTRAVENOUS
  Filled 2018-07-22 (×2): qty 5

## 2018-07-22 MED ORDER — HEPARIN SODIUM (PORCINE) 5000 UNIT/ML IJ SOLN
5000.0000 [IU] | Freq: Three times a day (TID) | INTRAMUSCULAR | Status: DC
  Administered 2018-07-23: 06:00:00 5000 [IU] via SUBCUTANEOUS
  Filled 2018-07-22: qty 1

## 2018-07-22 MED ORDER — PHENYLEPHRINE 40 MCG/ML (10ML) SYRINGE FOR IV PUSH (FOR BLOOD PRESSURE SUPPORT)
PREFILLED_SYRINGE | INTRAVENOUS | Status: AC
  Filled 2018-07-22: qty 10

## 2018-07-22 MED ORDER — LORATADINE 10 MG PO TABS
10.0000 mg | ORAL_TABLET | Freq: Every day | ORAL | Status: DC
  Administered 2018-07-22 – 2018-07-28 (×5): 10 mg via ORAL
  Filled 2018-07-22 (×5): qty 1

## 2018-07-22 MED ORDER — ATORVASTATIN CALCIUM 80 MG PO TABS
80.0000 mg | ORAL_TABLET | Freq: Every day | ORAL | Status: DC
  Administered 2018-07-22 – 2018-07-27 (×5): 80 mg via ORAL
  Filled 2018-07-22 (×5): qty 1

## 2018-07-22 MED ORDER — ONDANSETRON HCL 4 MG/2ML IJ SOLN
4.0000 mg | Freq: Four times a day (QID) | INTRAMUSCULAR | Status: DC | PRN
  Administered 2018-07-23 – 2018-07-26 (×3): 4 mg via INTRAVENOUS
  Filled 2018-07-22 (×3): qty 2

## 2018-07-22 MED ORDER — OXYCODONE HCL 5 MG PO TABS
5.0000 mg | ORAL_TABLET | Freq: Four times a day (QID) | ORAL | Status: DC | PRN
  Administered 2018-07-22 – 2018-07-23 (×2): 10 mg via ORAL
  Administered 2018-07-25: 14:00:00 5 mg via ORAL
  Filled 2018-07-22 (×2): qty 2
  Filled 2018-07-22: qty 1

## 2018-07-22 MED ORDER — POTASSIUM CHLORIDE CRYS ER 20 MEQ PO TBCR: 20.0000 meq | EXTENDED_RELEASE_TABLET | Freq: Every day | ORAL | Status: DC | PRN

## 2018-07-22 MED ORDER — CEFAZOLIN SODIUM-DEXTROSE 2-4 GM/100ML-% IV SOLN
2.0000 g | Freq: Three times a day (TID) | INTRAVENOUS | Status: AC
  Administered 2018-07-22: 17:00:00 2 g via INTRAVENOUS
  Filled 2018-07-22: qty 100

## 2018-07-22 MED ORDER — DARBEPOETIN ALFA 60 MCG/0.3ML IJ SOSY
60.0000 ug | PREFILLED_SYRINGE | INTRAMUSCULAR | Status: DC
  Administered 2018-07-23: 10:00:00 60 ug via INTRAVENOUS
  Filled 2018-07-22: qty 0.3

## 2018-07-22 MED ORDER — SODIUM CHLORIDE 0.9 % IV SOLN: 500.0000 mL | Freq: Once | INTRAVENOUS | Status: DC | PRN

## 2018-07-22 MED ORDER — MORPHINE SULFATE (PF) 2 MG/ML IV SOLN
2.0000 mg | INTRAVENOUS | Status: DC | PRN
  Administered 2018-07-22: 17:00:00 2 mg via INTRAVENOUS
  Filled 2018-07-22: qty 1

## 2018-07-22 MED ORDER — DEXAMETHASONE SODIUM PHOSPHATE 10 MG/ML IJ SOLN
INTRAMUSCULAR | Status: AC
  Filled 2018-07-22: qty 1

## 2018-07-22 MED ORDER — GABAPENTIN 300 MG PO CAPS: 900.0000 mg | ORAL_CAPSULE | Freq: Every day | ORAL | Status: DC

## 2018-07-22 MED ORDER — HYDROMORPHONE HCL 1 MG/ML IJ SOLN
INTRAMUSCULAR | Status: AC
  Filled 2018-07-22: qty 1

## 2018-07-22 MED ORDER — PROTAMINE SULFATE 10 MG/ML IV SOLN
INTRAVENOUS | Status: DC | PRN
  Administered 2018-07-22: 50 mg via INTRAVENOUS

## 2018-07-22 MED ORDER — SUCROFERRIC OXYHYDROXIDE 500 MG PO CHEW
500.0000 mg | CHEWABLE_TABLET | ORAL | Status: DC
  Administered 2018-07-22 – 2018-07-28 (×6): 500 mg via ORAL
  Filled 2018-07-22 (×20): qty 1

## 2018-07-22 MED ORDER — PROPOFOL 10 MG/ML IV BOLUS
INTRAVENOUS | Status: DC | PRN
  Administered 2018-07-22: 110 mg via INTRAVENOUS

## 2018-07-22 MED ORDER — HYDRALAZINE HCL 20 MG/ML IJ SOLN: 5.0000 mg | INTRAMUSCULAR | Status: DC | PRN

## 2018-07-22 MED ORDER — PROTAMINE SULFATE 10 MG/ML IV SOLN
INTRAVENOUS | Status: AC
  Filled 2018-07-22: qty 5

## 2018-07-22 MED ORDER — SUCCINYLCHOLINE CHLORIDE 200 MG/10ML IV SOSY
PREFILLED_SYRINGE | INTRAVENOUS | Status: DC | PRN
  Administered 2018-07-22: 100 mg via INTRAVENOUS

## 2018-07-22 MED ORDER — SUCROFERRIC OXYHYDROXIDE 500 MG PO CHEW
1000.0000 mg | CHEWABLE_TABLET | Freq: Three times a day (TID) | ORAL | Status: DC
  Administered 2018-07-22 – 2018-07-28 (×11): 1000 mg via ORAL
  Filled 2018-07-22 (×20): qty 2

## 2018-07-22 MED ORDER — ALUM & MAG HYDROXIDE-SIMETH 200-200-20 MG/5ML PO SUSP: 15.0000 mL | ORAL | Status: DC | PRN

## 2018-07-22 MED ORDER — LIDOCAINE 2% (20 MG/ML) 5 ML SYRINGE
INTRAMUSCULAR | Status: DC | PRN
  Administered 2018-07-22: 60 mg via INTRAVENOUS

## 2018-07-22 MED ORDER — FENTANYL CITRATE (PF) 100 MCG/2ML IJ SOLN
INTRAMUSCULAR | Status: DC | PRN
  Administered 2018-07-22: 50 ug via INTRAVENOUS
  Administered 2018-07-22: 100 ug via INTRAVENOUS
  Administered 2018-07-22 (×2): 50 ug via INTRAVENOUS

## 2018-07-22 MED ORDER — ACETAMINOPHEN 325 MG PO TABS: 325.0000 mg | ORAL_TABLET | ORAL | Status: DC | PRN

## 2018-07-22 MED ORDER — HYDROMORPHONE HCL 1 MG/ML IJ SOLN
0.2500 mg | INTRAMUSCULAR | Status: DC | PRN
  Administered 2018-07-22: 11:00:00 0.25 mg via INTRAVENOUS

## 2018-07-22 MED ORDER — CEFAZOLIN SODIUM-DEXTROSE 2-4 GM/100ML-% IV SOLN
2.0000 g | INTRAVENOUS | Status: AC
  Administered 2018-07-22: 2 g via INTRAVENOUS
  Filled 2018-07-22: qty 100

## 2018-07-22 MED ORDER — ONDANSETRON HCL 4 MG/2ML IJ SOLN
INTRAMUSCULAR | Status: DC | PRN
  Administered 2018-07-22: 4 mg via INTRAVENOUS

## 2018-07-22 MED ORDER — FENTANYL CITRATE (PF) 250 MCG/5ML IJ SOLN
INTRAMUSCULAR | Status: AC
  Filled 2018-07-22: qty 5

## 2018-07-22 MED ORDER — HEPARIN SODIUM (PORCINE) 1000 UNIT/ML IJ SOLN
INTRAMUSCULAR | Status: DC | PRN
  Administered 2018-07-22: 10000 [IU] via INTRAVENOUS

## 2018-07-22 MED ORDER — ETHYL CHLORIDE EX AERO: 1.0000 "application " | INHALATION_SPRAY | CUTANEOUS | Status: DC

## 2018-07-22 MED ORDER — CLOPIDOGREL BISULFATE 75 MG PO TABS
75.0000 mg | ORAL_TABLET | Freq: Every day | ORAL | Status: DC
  Administered 2018-07-23 – 2018-07-27 (×5): 75 mg via ORAL
  Filled 2018-07-22 (×5): qty 1

## 2018-07-22 MED ORDER — PROPOFOL 10 MG/ML IV BOLUS
INTRAVENOUS | Status: AC
  Filled 2018-07-22: qty 20

## 2018-07-22 MED ORDER — MUPIROCIN 2 % EX OINT
1.0000 "application " | TOPICAL_OINTMENT | Freq: Once | CUTANEOUS | Status: AC
  Administered 2018-07-22: 1 via TOPICAL
  Filled 2018-07-22: qty 22

## 2018-07-22 MED ORDER — DOCUSATE SODIUM 100 MG PO CAPS
100.0000 mg | ORAL_CAPSULE | Freq: Every day | ORAL | Status: DC
  Administered 2018-07-25 – 2018-07-28 (×4): 100 mg via ORAL
  Filled 2018-07-22 (×4): qty 1

## 2018-07-22 MED ORDER — OXYCODONE HCL ER 10 MG PO T12A: 10.0000 mg | EXTENDED_RELEASE_TABLET | Freq: Every day | ORAL | Status: DC | PRN

## 2018-07-22 MED ORDER — PANTOPRAZOLE SODIUM 40 MG PO TBEC
40.0000 mg | DELAYED_RELEASE_TABLET | Freq: Every day | ORAL | Status: DC
  Administered 2018-07-22 – 2018-07-28 (×5): 40 mg via ORAL
  Filled 2018-07-22 (×5): qty 1

## 2018-07-22 MED ORDER — DEXAMETHASONE SODIUM PHOSPHATE 10 MG/ML IJ SOLN
INTRAMUSCULAR | Status: DC | PRN
  Administered 2018-07-22: 5 mg via INTRAVENOUS

## 2018-07-22 MED ORDER — SODIUM CHLORIDE 0.9 % IV SOLN
INTRAVENOUS | Status: DC | PRN
  Administered 2018-07-22: 08:00:00

## 2018-07-22 SURGICAL SUPPLY — 62 items
ADH SKN CLS APL DERMABOND .7 (GAUZE/BANDAGES/DRESSINGS) ×2
BANDAGE ESMARK 6X9 LF (GAUZE/BANDAGES/DRESSINGS) IMPLANT
BNDG CMPR 9X6 STRL LF SNTH (GAUZE/BANDAGES/DRESSINGS) ×1
BNDG ESMARK 6X9 LF (GAUZE/BANDAGES/DRESSINGS) ×2
CANISTER SUCT 3000ML PPV (MISCELLANEOUS) ×2 IMPLANT
CANNULA VESSEL 3MM 2 BLNT TIP (CANNULA) ×1 IMPLANT
CLIP VESOCCLUDE MED 24/CT (CLIP) ×2 IMPLANT
CLIP VESOCCLUDE SM WIDE 24/CT (CLIP) ×2 IMPLANT
COVER PROBE W GEL 5X96 (DRAPES) ×1 IMPLANT
COVER WAND RF STERILE (DRAPES) ×1 IMPLANT
CUFF TOURN SGL QUICK 34 (TOURNIQUET CUFF) ×2
CUFF TOURNIQUET SINGLE 24IN (TOURNIQUET CUFF) IMPLANT
CUFF TOURNIQUET SINGLE 34IN LL (TOURNIQUET CUFF) ×1 IMPLANT
CUFF TOURNIQUET SINGLE 44IN (TOURNIQUET CUFF) IMPLANT
CUFF TRNQT CYL 34X4.125X (TOURNIQUET CUFF) IMPLANT
DERMABOND ADVANCED (GAUZE/BANDAGES/DRESSINGS) ×2
DERMABOND ADVANCED .7 DNX12 (GAUZE/BANDAGES/DRESSINGS) ×1 IMPLANT
DRAIN CHANNEL 15F RND FF W/TCR (WOUND CARE) IMPLANT
DRAPE C-ARM 42X72 X-RAY (DRAPES) IMPLANT
DRAPE HALF SHEET 40X57 (DRAPES) ×1 IMPLANT
DRAPE X-RAY CASS 24X20 (DRAPES) IMPLANT
ELECT REM PT RETURN 9FT ADLT (ELECTROSURGICAL) ×2
ELECTRODE REM PT RTRN 9FT ADLT (ELECTROSURGICAL) ×1 IMPLANT
EVACUATOR SILICONE 100CC (DRAIN) IMPLANT
GLOVE BIO SURGEON STRL SZ 6.5 (GLOVE) ×2 IMPLANT
GLOVE BIO SURGEON STRL SZ7.5 (GLOVE) ×2 IMPLANT
GLOVE BIOGEL M 7.0 STRL (GLOVE) ×3 IMPLANT
GLOVE BIOGEL PI IND STRL 6.5 (GLOVE) IMPLANT
GLOVE BIOGEL PI INDICATOR 6.5 (GLOVE) ×3
GLOVE SURG SS PI 6.5 STRL IVOR (GLOVE) ×2 IMPLANT
GOWN STRL REUS W/ TWL LRG LVL3 (GOWN DISPOSABLE) ×2 IMPLANT
GOWN STRL REUS W/ TWL XL LVL3 (GOWN DISPOSABLE) ×1 IMPLANT
GOWN STRL REUS W/TWL LRG LVL3 (GOWN DISPOSABLE) ×8
GOWN STRL REUS W/TWL XL LVL3 (GOWN DISPOSABLE) ×2
GRAFT PROPATEN W/RING 6X80X60 (Vascular Products) ×1 IMPLANT
HEMOSTAT SNOW SURGICEL 2X4 (HEMOSTASIS) ×2 IMPLANT
INSERT FOGARTY SM (MISCELLANEOUS) ×1 IMPLANT
KIT BASIN OR (CUSTOM PROCEDURE TRAY) ×2 IMPLANT
KIT TURNOVER KIT B (KITS) ×2 IMPLANT
MARKER GRAFT CORONARY BYPASS (MISCELLANEOUS) IMPLANT
NS IRRIG 1000ML POUR BTL (IV SOLUTION) ×4 IMPLANT
PACK PERIPHERAL VASCULAR (CUSTOM PROCEDURE TRAY) ×2 IMPLANT
PAD ARMBOARD 7.5X6 YLW CONV (MISCELLANEOUS) ×4 IMPLANT
SET COLLECT BLD 21X3/4 12 (NEEDLE) IMPLANT
SPONGE LAP 18X18 RF (DISPOSABLE) ×1 IMPLANT
STOPCOCK 4 WAY LG BORE MALE ST (IV SETS) IMPLANT
SUT ETHILON 3 0 PS 1 (SUTURE) IMPLANT
SUT MNCRL AB 4-0 PS2 18 (SUTURE) ×4 IMPLANT
SUT PROLENE 5 0 C 1 24 (SUTURE) ×5 IMPLANT
SUT PROLENE 6 0 BV (SUTURE) ×5 IMPLANT
SUT PROLENE 7 0 BV 1 (SUTURE) IMPLANT
SUT SILK 2 0 SH (SUTURE) ×2 IMPLANT
SUT SILK 3 0 (SUTURE) ×2
SUT SILK 3-0 18XBRD TIE 12 (SUTURE) IMPLANT
SUT VIC AB 2-0 CT1 27 (SUTURE) ×4
SUT VIC AB 2-0 CT1 TAPERPNT 27 (SUTURE) ×2 IMPLANT
SUT VIC AB 3-0 SH 27 (SUTURE) ×4
SUT VIC AB 3-0 SH 27X BRD (SUTURE) ×2 IMPLANT
TOWEL GREEN STERILE (TOWEL DISPOSABLE) ×2 IMPLANT
TRAY FOLEY MTR SLVR 16FR STAT (SET/KITS/TRAYS/PACK) ×2 IMPLANT
UNDERPAD 30X30 (UNDERPADS AND DIAPERS) ×2 IMPLANT
WATER STERILE IRR 1000ML POUR (IV SOLUTION) ×2 IMPLANT

## 2018-07-22 NOTE — Anesthesia Postprocedure Evaluation (Signed)
Anesthesia Post Note  Patient: TUCK DULWORTH  Procedure(s) Performed: left FEMORAL TO BELOW KNEE POPLITEAL ARTERY BYPASS GREAFT using 1mm removable ring propaten gore graft (Left Leg Upper)     Patient location during evaluation: PACU Anesthesia Type: General Level of consciousness: awake and alert Pain management: pain level controlled Vital Signs Assessment: post-procedure vital signs reviewed and stable Respiratory status: spontaneous breathing, nonlabored ventilation, respiratory function stable and patient connected to nasal cannula oxygen Cardiovascular status: blood pressure returned to baseline and stable Postop Assessment: no apparent nausea or vomiting Anesthetic complications: no    Last Vitals:  Vitals:   07/22/18 1045 07/22/18 1057  BP: (!) 103/52 (!) 106/51  Pulse: 84 83  Resp: (!) 21 13  Temp:  (!) 36.3 C  SpO2: 100% 100%    Last Pain:  Vitals:   07/22/18 1037  TempSrc:   PainSc: 3                  Abdulwahab Demelo,W. EDMOND

## 2018-07-22 NOTE — Consult Note (Addendum)
[facility] Renal Consultation Note  Indication for Consultation:  Management of ESRD/hemodialysis; anemia, hypertension/volume and secondary hyperparathyroidism  HPI: Jose Garrett is a 68 y.o. male with ESRD due to cardiorenal syndrome in setting of AL amyloidosis, started HD 01/2016, HTN, GERD, hyperlipidemia, RLS, thrombocytopenia (says started after chemo). Recent findings of left foot PAD with color changes in Left foot walks with cane .Had SFA bilaterally revealing has an occlusion as well as the left popliteal artery and TP trunk occlusion. Now admitted Post O Dr Cain=  Left common femoral to below-knee popliteal artery bypass graft with 6 mm ringed PTFE with Harvest of left below-knee greater saphenous vein.   Last hd yesterday on schedule  ( compliant with op hd MWF) leaving slightly above edw and bp stable pre and post. We are consulted for ESRD/ HD issues  Currently in room  With minimal post  Op discomfort Left groin . He denies  cough  fevers, chills, sob, cp , abdominal discomfort, dizziness, or problem on HD       Past Medical History:  Diagnosis Date  . Amyloidosis ([place])   . Anemia Dx 2016  . Anxiety    "especially with procedure"- 07/21/2018  . CAD (coronary artery disease)    a. cardiac cath on 11/25/17 with occlusion of the RCA which could have been his event but the RCA could not be engaged. The RCA filled distally from left to right collaterals. There were no severe stenoses in the left coronary system. Medical therapy recommended, started on plavix.  . Chronic combined systolic and diastolic CHF (congestive heart failure) ([place])    a. EF dropped to 35-40% with grade 2 DD by echo 11/2017.  [name] Diarrhea   . ESRD (end stage renal disease) ([place])    ESRD- HEMO MWF- [employer]  . Family history of adverse reaction to anesthesia    "my daughter can't take certain anesthesia agents" (09/08/2014)  . GERD (gastroesophageal reflux disease)   . Heart murmur   .  History of blood transfusion 09/08/2014   "got hematoma after renal biopsy & HgB dropped"  . Hyperlipidemia Dx 2012  . Hypertension Dx 2012  . Myocardial infarction ([place]) 11/2017  . Neuropathy   . Peripheral vascular disease ([place])   . Restless legs     Past Surgical History:  Procedure Laterality Date  . ABDOMINAL AORTOGRAM W/LOWER EXTREMITY Bilateral 07/16/2018   Procedure: ABDOMINAL AORTOGRAM W/LOWER EXTREMITY;  Surgeon: Wellington Hampshire, MD;  Location: [place] [facility];  Service: Cardiovascular;  Laterality: Bilateral;  . AV FISTULA PLACEMENT Right 12/27/2015   Procedure: Right Arm ARTERIOVENOUS (AV) FISTULA CREATION;  Surgeon: Angelia Mould, MD;  Location: [facility];  Service: Vascular;  Laterality: Right;  . COLONOSCOPY    . IR GENERIC HISTORICAL  01/27/2016   IR FLUORO GUIDE CV LINE RIGHT 01/27/2016 Arne Cleveland, MD [facility]  . IR GENERIC HISTORICAL  01/27/2016   IR US GUIDE VASC ACCESS RIGHT 01/27/2016 Arne Cleveland, MD [facility]  . LAPAROSCOPIC CHOLECYSTECTOMY  03/2011  . LEFT HEART CATH AND CORONARY ANGIOGRAPHY N/A 11/25/2017   Procedure: LEFT HEART CATH AND CORONARY ANGIOGRAPHY;  Surgeon: Martinique, Peter M, MD;  Location: [place] [facility];  Service: Cardiovascular;  Laterality: N/A;  . RENAL BIOPSY, PERCUTANEOUS Right 09/08/2014  . RIGHT/LEFT HEART CATH AND CORONARY ANGIOGRAPHY N/A 03/25/2018   Procedure: RIGHT/LEFT HEART CATH AND CORONARY ANGIOGRAPHY;  Surgeon: Martinique, Peter M, MD;  Location: [place] [facility];  Service: Cardiovascular;  Laterality: N/A;  . TONSILLECTOMY  ~ 1960  . ULTRASOUND GUIDANCE FOR VASCULAR ACCESS  03/25/2018   Procedure: Ultrasound Guidance For Vascular Access;  Surgeon: Martinique, Peter M, MD;  Location: [place] [facility];  Service: Cardiovascular;;      Family History  Problem Relation Age of Onset  . Hypertension Mother   . Diabetes Mother   . Heart disease Mother   . Skin cancer Mother   . Hypertension Father   . Diabetes  Father   . Diabetes Sister       reports that he quit smoking about 7 years ago. His smoking use included cigarettes. He has a 80.00 pack-year smoking history. He has never used smokeless tobacco. He reports that he does not drink alcohol or use drugs.  No Known Allergies  Prior to Admission medications   Medication Sig Start Date End Date Taking? Authorizing Provider  acetaminophen (TYLENOL) 500 MG tablet Take 500-1,000 mg by mouth daily as needed (pain).    Yes [provider]  aspirin EC 81 MG tablet Take 1 tablet (81 mg total) by mouth daily. 11/30/17  Yes Cheryln Manly, NP  atorvastatin (LIPITOR) 80 MG tablet Take 80 mg by mouth daily with supper.    Yes [provider]  cetirizine (ZYRTEC) 10 MG tablet Take 10 mg by mouth daily.   Yes [provider]  clopidogrel (PLAVIX) 75 MG tablet Take 75 mg by mouth daily with supper.   Yes [provider]  ethyl chloride spray Apply 1 application topically See admin instructions. Spray on arm immediately prior to dialysis on Monday, Wednesday and Friday   Yes [provider]  gabapentin (NEURONTIN) 300 MG capsule TAKE 1 CAPSULE BY MOUTH EVERY MORNING AND 3 CAPSULES EACH NIGHT AT BEDTIME Patient taking differently: Take 300-900 mg by mouth See admin instructions. Take 1 capsule (300 mg) by mouth in the morning & take 3 capsules (900 mg) by mouth at bedtime 01/07/18  Yes Cameron Sprang, MD  oxyCODONE (OXY IR/ROXICODONE) 5 MG immediate release tablet Take 1-2 tablets (5-10 mg total) by mouth every 6 (six) hours as needed for severe pain. Patient taking differently: Take 5 mg by mouth daily as needed for moderate pain.  10/02/17  Yes [name], MD  oxyCODONE (OXYCONTIN) 10 mg 12 hr tablet Take 10 mg by mouth daily as needed (severe pain).    Yes [provider]  sucroferric oxyhydroxide (VELPHORO) 500 MG chewable tablet Chew 500-1,500 mg by mouth See admin instructions. Take 1 tablet  (500 mg) by mouth with a small meal or snack & take 2 or 3 tablets (1000 - 1500 mg) by mouth with large meals   Yes [provider]  vitamin B-12 (CYANOCOBALAMIN) 1000 MCG tablet Take 1,000 mcg by mouth daily.   Yes [provider]     Anti-infectives (From admission, onward)   Start     Dose/Rate Route Frequency Ordered Stop   07/22/18 1500  ceFAZolin (ANCEF) IVPB 2g/100 mL premix     2 g 200 mL/hr over 30 Minutes Intravenous Every 8 hours 07/22/18 1144 07/22/18 2259   07/22/18 0553  ceFAZolin (ANCEF) IVPB 2g/100 mL premix     2 g 200 mL/hr over 30 Minutes Intravenous 30 min pre-op 07/22/18 0553 07/22/18 0724      Results for orders placed or performed during the hospital encounter of 07/22/18 (from the past 48 hour(s))  Surgical pcr screen     Status: None  Collection Time: 07/22/18  5:55 AM  Result Value Ref Range   MRSA, PCR NEGATIVE NEGATIVE   Staphylococcus aureus NEGATIVE NEGATIVE    Comment: (NOTE) The Xpert SA Assay (FDA approved for NASAL specimens in patients 71 years of age and older), is one component of a comprehensive surveillance program. It is not intended to diagnose infection nor to guide or monitor treatment. Performed at [facility], [place] [address] [place], [place] [address]   Type and screen     Status: None   Collection Time: 07/22/18  6:16 AM  Result Value Ref Range   ABO/RH(D) O POS    Antibody Screen NEG    Sample Expiration      07/25/2018,2359 Performed at [facility], [place] [address], [place], [place] [address]   APTT     Status: Abnormal   Collection Time: 07/22/18  6:23 AM  Result Value Ref Range   aPTT 45 (H) 24 - 36 seconds    Comment:        IF BASELINE aPTT IS ELEVATED, SUGGEST PATIENT RISK ASSESSMENT BE USED TO DETERMINE APPROPRIATE ANTICOAGULANT THERAPY. Performed at [facility], [place] [address], [place], [place] [address]   Comprehensive metabolic panel     Status: Abnormal   Collection  Time: 07/22/18  6:23 AM  Result Value Ref Range   Sodium 139 135 - 145 mmol/L   Potassium 4.0 3.5 - 5.1 mmol/L   Chloride 89 (L) 98 - 111 mmol/L   CO2 25 22 - 32 mmol/L   Glucose, Bld 118 (H) 70 - 99 mg/dL   BUN 30 (H) 8 - 23 mg/dL   Creatinine, Ser 7.72 (H) 0.61 - 1.24 mg/dL   Calcium 9.1 8.9 - 10.3 mg/dL   Total Protein 6.8 6.5 - 8.1 g/dL   Albumin 3.4 (L) 3.5 - 5.0 g/dL   AST 16 15 - 41 U/L   ALT 14 0 - 44 U/L   Alkaline Phosphatase 55 38 - 126 U/L   Total Bilirubin 0.7 0.3 - 1.2 mg/dL   GFR calc non Af Amer 7 (L) >60 mL/min   GFR calc Af Amer 8 (L) >60 mL/min   Anion gap 25 (H) 5 - 15    Comment: Performed at [facility], [place] [address], [place], [place] [address]  Protime-INR     Status: Abnormal   Collection Time: 07/22/18  6:23 AM  Result Value Ref Range   Prothrombin Time 15.4 (H) 11.4 - 15.2 seconds   INR 1.2 0.8 - 1.2    Comment: (NOTE) INR goal varies based on device and disease states. Performed at [facility], [place] [address], [place], [place] [address]   CBC     Status: Abnormal   Collection Time: 07/22/18  6:23 AM  Result Value Ref Range   WBC 5.5 4.0 - 10.5 K/uL   RBC 2.92 (L) 4.22 - 5.81 MIL/uL   Hemoglobin 9.7 (L) 13.0 - 17.0 g/dL   HCT 29.8 (L) 39.0 - 52.0 %   MCV 102.1 (H) 80.0 - 100.0 fL   MCH 33.2 26.0 - 34.0 pg   MCHC 32.6 30.0 - 36.0 g/dL   RDW 14.5 11.5 - 15.5 %   Platelets 134 (L) 150 - 400 K/uL   nRBC 0.0 0.0 - 0.2 %    Comment: Performed at [facility], [place] [address], [place], [place] [address]    ROS: see hpi  Physical Exam: Vitals:   07/22/18 1045 07/22/18 1057  BP: (!) 103/52 (!) 106/51  Pulse: 84 83  Resp: (!) 21 13  Temp:  (!) 97.3 F (36.3 C)  SpO2: 100% 100%     General: Alert, WD, WN  Elderly male , NAD , Pleasant  HEENT: [place], EOMI, Not icteric  MMM Neck: supple, no jvd Heart: RRR ,nom,r,g  Lungs: CTA bilat, unlabored breathing  Abdomen: BS pos,Soft ,NT, ND  Extremities:  Left groin incision dry/  clean, no pedal edema , Left toes>/ right toes  ischemic skin changes  Skin: no overt rash, warm dry , lower extrem skin changes as above  Neuro: Alert OX 3, moves all extre to requests , no acute focal deficits  appreciated  Dialysis Access: Pos bruit  R Forearm AVF    OP Dialysis Orders:  :[facility]   on 4hr . EDW 86kg HD Bath 2k, 2ca  Time 4hr Heparin 8000. Access Right FA AVF      Calcitriol 1.25 mcg po /HD Sensipar 60 mg po q hd  Mircera 50 mcg q hd ( last givern 07/09/18  HGB 10.3  07/09/18   Assessment/Plan 1. PAD== Critical left lower extremity ischemia = Sp Dr Donzetta Matters Left common femoral to below-knee popliteal artery bypass graft/ Harvest of left below-knee greater saphenous vein- Rx per [facility]  Dr Donzetta Matters  2. ESRD -  HD on MWF  k 4.0 , hd in am  3. Hypertension/volume  - Volume ok , at edw 86.2 by wt today , Uf to edw tomor / no  Op bp meds  4. Anemia  - HGB 9.7  Next esa due 5/13  Give Aranesp   / venofer weekly hd  5. Metabolic bone disease -  Vit d po on hd , phos binder = Venofer  6.  Ischm CM/ (ef 45% jan 2020/CAD/ ho AS = cleared by Card pre op no current issues ,no bb, ace , arb 2/2 low bp with hd  avoid hypotension with hd   Ernest Haber, PA-C [facility] [phone] 07/22/2018, 11:17 AM

## 2018-07-22 NOTE — Transfer of Care (Signed)
Immediate Anesthesia Transfer of Care Note  Patient: Jose Garrett  Procedure(s) Performed: left FEMORAL TO BELOW KNEE POPLITEAL ARTERY BYPASS GREAFT using 63mm removable ring propaten gore graft (Left Leg Upper)  Patient Location: PACU  Anesthesia Type:General  Level of Consciousness: awake and patient cooperative  Airway & Oxygen Therapy: Patient Spontanous Breathing and Patient connected to nasal cannula oxygen  Post-op Assessment: Report given to RN, Post -op Vital signs reviewed and stable and Patient moving all extremities  Post vital signs: Reviewed and stable  Last Vitals:  Vitals Value Taken Time  BP 107/89 07/22/2018 10:13 AM  Temp    Pulse 86 07/22/2018 10:13 AM  Resp 21 07/22/2018 10:13 AM  SpO2 100 % 07/22/2018 10:13 AM  Vitals shown include unvalidated device data.  Last Pain:  Vitals:   07/22/18 0614  TempSrc:   PainSc: 7       Patients Stated Pain Goal: 3 (92/01/00 7121)  Complications: No apparent anesthesia complications

## 2018-07-22 NOTE — Op Note (Signed)
Patient name: Jose Garrett MRN: [id number] DOB: 10/25/1950 Sex: male  07/22/2018 Pre-operative Diagnosis: Critical left lower extremity ischemia with toe ulceration Post-operative diagnosis:  Same Surgeon:  Erlene Quan C. Donzetta Matters, MD Assistant: Leontine Locket, PA Procedure Performed: 1.  Left common femoral to below-knee popliteal artery bypass graft with 6 mm ringed PTFE 2.  Harvest of left below-knee greater saphenous vein  Indications: 68 year old male with bilateral critical limb ischemia.  He is undergone angiography is indicated for left femoral to below-knee popliteal artery bypass.  Findings: Common femoral bifurcation was under the inguinal ligament.  The common femoral artery itself was heavily calcified and limited endarterectomy was performed.  We did have backbleeding from the profunda.  The below-knee popliteal artery was also heavily calcified and we used a tourniquet as I did not have an area for clamping.  Following bypass there was very strong signal with posterior tibial artery at the ankle which was absent with compression of the graft.  The below knee popliteal saphenous vein was harvested for approximately 10 cm but was quite sclerotic and could not be cannulated we did not harvest the thigh saphenous vein for this reason.   Procedure:  The patient was identified in the holding area and taken to the operating room where is placed supine operative table and general anesthesia induced.  He was gently prepped and draped in the left lower extremity usual fashion antibiotics were administered and timeout was called.  I began using ultrasound to identify the saphenous vein was really quite small in the thigh below the knee.  Somewhat larger.  Identify the common femoral artery bifurcation to be quite high.  Transverse incision was made over the common femoral artery.  We dissected down to the level of the common femoral artery itself.  We divided some of the inguinal ligament dissected  up under this.  Vesseloops were placed around branches and around the external iliac artery.  The crossing vein was divided between ties.  Then dissected out the profunda and SFA and Vesseloops were placed around this.  Internal rotation of the below-knee popliteal artery exposure.  We made a longitudinal incision first identified the vein and traced this for several centimeters.  I extended my incision caudally.  Branches were taken between clips and ties.  After over 10 cm of vein was exposed we then transected it distally after clipping the distal edge.  Attempted to open it but it was quite sclerotic.  Became quite evident towards the knee that this vein would not be suitable it was clipped and divided.  We then dissected deeper through the fascia identified the popliteal vein and artery.  Anterior tibial vein was divided between ties.  The artery was marked I elected not to fully resect this out because it was heavily calcified.  A tunneler was placed from the below-knee popliteal artery to the common femoral artery exposure site and patient was fully heparinized.  A 6 mm ringed PTFE graft was tunneled.  We then clamped our profunda artery followed by our external iliac artery on the left.  This was opened longitudinally.  This was heavily calcified and significantly friable.  Limited endarterectomy was performed down to the profunda artery we did establish backbleeding there.  The artery was then cleaned up on the edges.  The graft was trimmed to size and sewn end-to-side with 5-0 Prolene suture.  Upon completion we released the clamps.  We did have to place a few repair sutures where the artery  was not holding.  We established pulsatile antegrade flow which was confirmed below the knee.  Tourniquet was then placed above the knee Esmarch was used to exsanguinate the lower extremity and it was inflated to 250 mmHg.  The popliteal artery was then opened longitudinally.  Here was heavily diseased.  We did perform  limited endarterectomy here.  Leg was straightened graft trimmed to size and sewn end-to-side with 6-0 Prolene suture.  Prior to completion anastomosis we allowed the tourniquet down we did establish good backbleeding.  The graft was then unclamped with good antegrade bleeding.  Completed the anastomosis.  There was good signal distally the posterior tibial artery at the ankle was essentially nonexistent with compression of the graft.  50 mg of protamine was administered hemostasis obtained.  All wounds were irrigated closed in layers of Vicryl Monocryl.  Dermabond placed to level skin.  He was awakened anesthesia having tolerated procedure well without immediate complication.  All counts were correct at completion.  EBL 200 cc   Keta [place] C. Donzetta Matters, MD [facility] Office: [phone] Pager: [phone]

## 2018-07-22 NOTE — Evaluation (Signed)
Physical Therapy Evaluation Patient Details Name: Jose Garrett MRN: [id number] DOB: [birth year] Today's Date: 07/22/2018   History of Present Illness  Admitted with abnormal vascular studies, now s/p Left common femoral to below-knee popliteal artery bypass graft; extensive medical problems including ESRD on HD, AL amyloid (kidney involvement), aortic stenosis, HTN, HLD, GERD, anemia, chronic combined CHF and CAD   Clinical Impression   Patient is s/p above surgery resulting in functional limitations due to the deficits listed below (see PT Problem List). Independent, using cane to ambulate prior to admission; presents with pain limiting strength, activity tolerance, and overall functional mobility; Anticipate good recovery as pain is managed; he is considering going to his daughter's home, which is a good option;  Patient will benefit from skilled PT to increase their independence and safety with mobility to allow discharge to the venue listed below.    Recommend Jose Garrett undergo HD in the HD recliner while he is here.     Follow Up Recommendations Home health PT;Supervision - Intermittent    Equipment Recommendations  Rolling walker with 5" wheels;3in1 (PT)    Recommendations for Other Services       Precautions / Restrictions Precautions Precautions: Fall Restrictions Weight Bearing Restrictions: No      Mobility  Bed Mobility Overal bed mobility: Needs Assistance Bed Mobility: Supine to Sit;Sit to Supine     Supine to sit: Mod assist Sit to supine: Mod assist   General bed mobility comments: Mod assist to elelvate trunk to sit and use of bed pad to square off hips at EOB  Transfers Overall transfer level: Needs assistance Equipment used: Rolling walker (2 wheeled) Transfers: Sit to/from Stand Sit to Stand: Mod assist         General transfer comment: Mod assist to rise; most of weight on RLE; cues for hand placement and safety;  painful  Ambulation/Gait Ambulation/Gait assistance: Min assist Gait Distance (Feet): (sidesteps to [facility]) Assistive device: Rolling walker (2 wheeled)       General Gait Details: Cues to self-monitor for activity tolerance; unable t put much weight on LLE; Good use of RW for support  Stairs            Wheelchair Mobility    Modified Rankin (Stroke Patients Only)       Balance Overall balance assessment: Needs assistance   Sitting balance-Leahy Scale: Fair       Standing balance-Leahy Scale: Poor Standing balance comment: heavily depend3ent on RW                             Pertinent Vitals/Pain Pain Assessment: Faces Faces Pain Scale: Hurts whole lot Pain Location: LLE groin, and back of knee to foot Pain Descriptors / Indicators: Aching;Grimacing;Guarding Pain Intervention(s): Limited activity within patient's tolerance;Repositioned    Home Living Family/patient expects to be discharged to:: Private residence Living Arrangements: Alone Available Help at Discharge: Family;Available PRN/intermittently Type of Home: Apartment Home Access: Stairs to enter Entrance Stairs-Rails: Right Entrance Stairs-Number of Steps: 3 Home Layout: One level Home Equipment: Cane - single point Additional Comments: It sounds like he may opt to go to his daughter's home initially; no steps to enter    Prior Function Level of Independence: Independent with assistive device(s)         Comments: Consistently uses cane to walk     Hand Dominance        Extremity/Trunk Assessment   Upper Extremity Assessment  Upper Extremity Assessment: Overall WFL for tasks assessed    Lower Extremity Assessment Lower Extremity Assessment: LLE deficits/detail LLE Deficits / Details: Grossly decr AROM and strength, limited by pain postop; noted active quad contraction present, but weak; assist to lift LLE against gravity LLE: Unable to fully assess due to pain        Communication   Communication: No difficulties  Cognition Arousal/Alertness: Awake/alert Behavior During Therapy: WFL for tasks assessed/performed Overall Cognitive Status: Within Functional Limits for tasks assessed                                        General Comments General comments (skin integrity, edema, etc.): Session conducted on supplemental O2 3L; HR 106 at end of session    Exercises     Assessment/Plan    PT Assessment Patient needs continued PT services  PT Problem List Decreased strength;Decreased range of motion;Decreased activity tolerance;Decreased balance;Decreased mobility;Decreased coordination;Decreased knowledge of use of DME;Decreased safety awareness;Decreased knowledge of precautions;Pain       PT Treatment Interventions DME instruction;Gait training;Stair training;Functional mobility training;Therapeutic activities;Therapeutic exercise;Balance training;Patient/family education    PT Goals (Current goals can be found in the Care Plan section)  Acute Rehab PT Goals Patient Stated Goal: less pain in LLE PT Goal Formulation: With patient Time For Goal Achievement: 08/05/18 Potential to Achieve Goals: Good    Frequency Min 3X/week   Barriers to discharge Other (comment) Jose Garrett lives alone, and must be completely independent to go to his home; he is considering going to his daughter's home, which is a very goo d option    Co-evaluation               AM-PAC PT "6 Clicks" Mobility  Outcome Measure Help needed turning from your back to your side while in a flat bed without using bedrails?: A Little Help needed moving from lying on your back to sitting on the side of a flat bed without using bedrails?: A Lot Help needed moving to and from a bed to a chair (including a wheelchair)?: A Lot Help needed standing up from a chair using your arms (e.g., wheelchair or bedside chair)?: A Little Help needed to walk in hospital room?: A  Lot Help needed climbing 3-5 steps with a railing? : Total 6 Click Score: 13    End of Session   Activity Tolerance: Patient limited by pain Patient left: in bed;with call bell/phone within reach Nurse Communication: Mobility status PT Visit Diagnosis: Unsteadiness on feet (R26.81);Other abnormalities of gait and mobility (R26.89);Pain Pain - Right/Left: Left Pain - part of body: Leg    Time: 1601-0932 PT Time Calculation (min) (ACUTE ONLY): 35 min   Charges:   PT Evaluation $PT Eval Moderate Complexity: 1 Mod PT Treatments $Therapeutic Activity: 8-22 mins        Roney Marion, PT  Acute Rehabilitation Services Pager [phone] Office [place] 07/22/2018, 3:58 PM

## 2018-07-22 NOTE — Discharge Instructions (Signed)
 [facility]  Discharge instructions  Lower Extremity Bypass Surgery  Please refer to the following instruction for your post-procedure care. Your surgeon or physician assistant will discuss any changes with you.  Activity  You are encouraged to walk as much as you can. You can slowly return to normal activities during the month after your surgery. Avoid strenuous activity and heavy lifting until your doctor tells you it's OK. Avoid activities such as vacuuming or swinging a golf club. Do not drive until your doctor give the OK and you are no longer taking prescription pain medications. It is also normal to have difficulty with sleep habits, eating and bowel movement after surgery. These will go away with time.  Bathing/Showering  Shower daily after you go home. Do not soak in a bathtub, hot tub, or swim until the incision heals completely.  Incision Care  Clean your incision with mild soap and water. Shower every day. Pat the area dry with a clean towel. You do not need a bandage unless otherwise instructed. Do not apply any ointments or creams to your incision. If you have open wounds you will be instructed how to care for them or a visiting nurse may be arranged for you. If you have staples or sutures along your incision they will be removed at your post-op appointment. You may have skin glue on your incision. Do not peel it off. It will come off on its own in about one week.  Wash the groin wound with soap and water daily and pat dry. (No tub bath-only shower)  Then put a dry gauze or washcloth in the groin to keep this area dry to help prevent wound infection.  Do this daily and as needed.  Do not use Vaseline or neosporin on your incisions.  Only use soap and water on your incisions and then protect and keep dry.  Diet  Resume your normal diet. There are no special food restrictions following this procedure. A low fat/ low cholesterol diet is  recommended for all patients with vascular disease. In order to heal from your surgery, it is CRITICAL to get adequate nutrition. Your body requires vitamins, minerals, and protein. Vegetables are the best source of vitamins and minerals. Vegetables also provide the perfect balance of protein. Processed food has little nutritional value, so try to avoid this.  Medications  Resume taking all your medications unless your doctor or physician assistant tells you not to. If your incision is causing pain, you may take over-the-counter pain relievers such as acetaminophen (Tylenol). If you were prescribed a stronger pain medication, please aware these medication can cause nausea and constipation. Prevent nausea by taking the medication with a snack or meal. Avoid constipation by drinking plenty of fluids and eating foods with high amount of fiber, such as fruits, vegetables, and grains. Take Colace 100 mg (an over-the-counter stool softener) twice a day as needed for constipation.  Do not take Tylenol if you are taking prescription pain medications.  Follow Up  Our office will schedule a follow up appointment 2-3 weeks following discharge.  Please call us immediately for any of the following conditions  Severe or worsening pain in your legs or feet while at rest or while walking Increase pain, redness, warmth, or drainage (pus) from your incision site(s) Fever of 101 degree or higher The swelling in your leg with the bypass suddenly worsens and becomes more painful than when you were in the hospital If you have   been instructed to feel your graft pulse then you should do so every day. If you can no longer feel this pulse, call the office immediately. Not all patients are given this instruction.  Leg swelling is common after leg bypass surgery.  The swelling should improve over a few months following surgery. To improve the swelling, you may elevate your legs above the level of your heart while you are  sitting or resting. Your surgeon or physician assistant may ask you to apply an ACE wrap or wear compression (TED) stockings to help to reduce swelling.  Reduce your risk of vascular disease  Stop smoking. If you would like help call [facility] at 1-800-QUIT-NOW (1-800-784-8669) or  at 336-586-4000.  Manage your cholesterol Maintain a desired weight Control your diabetes weight Control your diabetes Keep your blood pressure down  If you have any questions, please call the office at [phone]  

## 2018-07-22 NOTE — Progress Notes (Signed)
  Day of Surgery Note    Subjective:  Says he's having some pain   Vitals:   07/22/18 1057 07/22/18 1538  BP: (!) 106/51 107/68  Pulse: 83 93  Resp: 13 15  Temp: (!) 97.3 F (36.3 C) 97.6 F (36.4 C)  SpO2: 100% 100%    Incisions:   Left groin and left BK incisions are clean and dry without hematoma Extremities:  Brisk PT/DP doppler signals Cardiac:  regular Lungs:  Non labored   Assessment/Plan:  This is a 68 y.o. male who is s/p  Left femoral to popliteal bypass grafting   -pt doing well with brisk PT doppler signal -both incisions look good without hematoma -renal notified of pt's admission   Leontine Locket, PA-C 07/22/2018 3:41 PM [phone]

## 2018-07-22 NOTE — Anesthesia Procedure Notes (Signed)
Procedure Name: Intubation Date/Time: 07/22/2018 7:35 AM Performed by: Moshe Salisbury, CRNA Pre-anesthesia Checklist: Patient identified, Emergency Drugs available, Suction available and Patient being monitored Patient Re-evaluated:Patient Re-evaluated prior to induction Oxygen Delivery Method: Circle System Utilized Preoxygenation: Pre-oxygenation with 100% oxygen Induction Type: IV induction and Rapid sequence Laryngoscope Size: Mac and 4 Grade View: Grade II Tube type: Oral Tube size: 8.0 mm Number of attempts: 1 Airway Equipment and Method: Stylet Placement Confirmation: ETT inserted through vocal cords under direct vision,  positive ETCO2 and breath sounds checked- equal and bilateral Secured at: 22 cm Tube secured with: Tape Dental Injury: Teeth and Oropharynx as per pre-operative assessment

## 2018-07-22 NOTE — H&P (Signed)
   History and Physical Update  The patient was interviewed and re-examined.  The patient's previous History and Physical has been reviewed and is unchanged from recent evaluation.  I reviewed the recent angiogram which demonstrates flush occlusion of the left SFA reconstituted above-knee popliteal artery which is diseased below the knee popliteal artery looks healthier runoff is via the posterior tibial artery dominant there is also a peroneal artery.  I have also reviewed his recent vein mapping which demonstrates suitable vein in his left thigh to the mid thigh level becomes much more diminutive after that.  From recent vein mapping appears he will need placement of a graft however we will evaluate at the time of surgery.  I discussed this with the patient he demonstrates good understanding.  I will discuss surgery with his ex-wife at the conclusion.   Sandy Blouch C. Donzetta Matters, MD [facility] Office: [phone] Pager: [phone]   07/22/2018, 7:15 AM

## 2018-07-23 ENCOUNTER — Encounter (HOSPITAL_COMMUNITY): Payer: Medicare Other

## 2018-07-23 ENCOUNTER — Inpatient Hospital Stay (HOSPITAL_COMMUNITY): Payer: Medicare Other

## 2018-07-23 ENCOUNTER — Encounter (HOSPITAL_COMMUNITY): Payer: Self-pay | Admitting: Vascular Surgery

## 2018-07-23 DIAGNOSIS — D649 Anemia, unspecified: Secondary | ICD-10-CM

## 2018-07-23 DIAGNOSIS — I739 Peripheral vascular disease, unspecified: Secondary | ICD-10-CM

## 2018-07-23 DIAGNOSIS — I25118 Atherosclerotic heart disease of native coronary artery with other forms of angina pectoris: Secondary | ICD-10-CM

## 2018-07-23 DIAGNOSIS — N186 End stage renal disease: Secondary | ICD-10-CM

## 2018-07-23 LAB — CBC WITH DIFFERENTIAL/PLATELET
Abs Immature Granulocytes: 0.07 K/uL (ref 0.00–0.07)
Basophils Absolute: 0 K/uL (ref 0.0–0.1)
Basophils Relative: 0 %
Eosinophils Absolute: 0 K/uL (ref 0.0–0.5)
Eosinophils Relative: 0 %
HCT: 21.5 % — ABNORMAL LOW (ref 39.0–52.0)
Hemoglobin: 7.4 g/dL — ABNORMAL LOW (ref 13.0–17.0)
Immature Granulocytes: 1 %
Lymphocytes Relative: 11 %
Lymphs Abs: 1 K/uL (ref 0.7–4.0)
MCH: 32.5 pg (ref 26.0–34.0)
MCHC: 34.4 g/dL (ref 30.0–36.0)
MCV: 94.3 fL (ref 80.0–100.0)
Monocytes Absolute: 0.8 K/uL (ref 0.1–1.0)
Monocytes Relative: 8 %
Neutro Abs: 7.7 K/uL (ref 1.7–7.7)
Neutrophils Relative %: 80 %
Platelets: 122 K/uL — ABNORMAL LOW (ref 150–400)
RBC: 2.28 MIL/uL — ABNORMAL LOW (ref 4.22–5.81)
RDW: 17.1 % — ABNORMAL HIGH (ref 11.5–15.5)
WBC: 9.6 K/uL (ref 4.0–10.5)
nRBC: 0 % (ref 0.0–0.2)

## 2018-07-23 LAB — BASIC METABOLIC PANEL
Anion gap: 15 (ref 5–15)
BUN: 47 mg/dL — ABNORMAL HIGH (ref 8–23)
CO2: 29 mmol/L (ref 22–32)
Calcium: 9.2 mg/dL (ref 8.9–10.3)
Chloride: 94 mmol/L — ABNORMAL LOW (ref 98–111)
Creatinine, Ser: 9.61 mg/dL — ABNORMAL HIGH (ref 0.61–1.24)
GFR calc Af Amer: 6 mL/min — ABNORMAL LOW (ref >60)
GFR calc non Af Amer: 5 mL/min — ABNORMAL LOW (ref >60)
Glucose, Bld: 126 mg/dL — ABNORMAL HIGH (ref 70–99)
Potassium: 5.9 mmol/L — ABNORMAL HIGH (ref 3.5–5.1)
Sodium: 138 mmol/L (ref 135–145)

## 2018-07-23 LAB — CBC
HCT: 22.5 % — ABNORMAL LOW (ref 39.0–52.0)
Hemoglobin: 7.3 g/dL — ABNORMAL LOW (ref 13.0–17.0)
MCH: 32.6 pg (ref 26.0–34.0)
MCHC: 32.4 g/dL (ref 30.0–36.0)
MCV: 100.4 fL — ABNORMAL HIGH (ref 80.0–100.0)
Platelets: 140 10*3/uL — ABNORMAL LOW (ref 150–400)
RBC: 2.24 MIL/uL — ABNORMAL LOW (ref 4.22–5.81)
RDW: 14.5 % (ref 11.5–15.5)
WBC: 8.5 10*3/uL (ref 4.0–10.5)
nRBC: 0 % (ref 0.0–0.2)

## 2018-07-23 LAB — HEPARIN LEVEL (UNFRACTIONATED): Heparin Unfractionated: 0.35 [IU]/mL (ref 0.30–0.70)

## 2018-07-23 LAB — PREPARE RBC (CROSSMATCH)

## 2018-07-23 LAB — TROPONIN I
Troponin I: 0.09 ng/mL (ref <0.03)
Troponin I: 5.25 ng/mL (ref <0.03)

## 2018-07-23 LAB — HEPATITIS B SURFACE ANTIGEN: Hepatitis B Surface Ag: NEGATIVE

## 2018-07-23 MED ORDER — ALBUMIN HUMAN 25 % IV SOLN
INTRAVENOUS | Status: AC
  Administered 2018-07-23: 25 g via INTRAVENOUS
  Filled 2018-07-23: qty 100

## 2018-07-23 MED ORDER — ALBUMIN HUMAN 25 % IV SOLN
25.0000 g | Freq: Once | INTRAVENOUS | Status: AC
  Administered 2018-07-23: 09:00:00 25 g via INTRAVENOUS

## 2018-07-23 MED ORDER — DARBEPOETIN ALFA 60 MCG/0.3ML IJ SOSY
PREFILLED_SYRINGE | INTRAMUSCULAR | Status: AC
  Administered 2018-07-23: 60 ug via INTRAVENOUS
  Filled 2018-07-23: qty 0.3

## 2018-07-23 MED ORDER — HEPARIN (PORCINE) 25000 UT/250ML-% IV SOLN
1450.0000 [IU]/h | INTRAVENOUS | Status: DC
  Administered 2018-07-23 – 2018-07-24 (×2): 1450 [IU]/h via INTRAVENOUS
  Filled 2018-07-23 (×2): qty 250

## 2018-07-23 MED ORDER — SODIUM CHLORIDE 0.9% IV SOLUTION: Freq: Once | INTRAVENOUS | Status: DC

## 2018-07-23 NOTE — Progress Notes (Signed)
PT Cancellation Note  Patient Details Name: Jose Garrett MRN: [id number] DOB: 03/28/1950   Cancelled Treatment:    Reason Eval/Treat Not Completed: Medical issues which prohibited therapy(Not feeling well after HD.  Will check back tomorrow. )   Denice Paradise 07/23/2018, 12:44 PM Kaylene Zenaida Tesar,PT Acute Rehabilitation Services Pager:  [phone]  Office:  [phone]

## 2018-07-23 NOTE — Progress Notes (Signed)
Patient is feeling a little bit better now.  He did have a bout of emesis.  He remains tachycardic in the 120s.  His systolic blood pressure is around 90.  Cardiology has been consulted for elevated troponins.  Lower extremity venous Dopplers have also been ordered.  Annamarie Major

## 2018-07-23 NOTE — Progress Notes (Signed)
OT Cancellation Note  Patient Details Name: Jose Garrett MRN: [id number] DOB: Jun 07, 1950   Cancelled Treatment:    Reason Eval/Treat Not Completed: Patient at procedure or test/ unavailable(HD)  [place] 07/23/2018, 9:17 AM   Hulda Humphrey OTR/L Acute Rehabilitation Services Pager: [phone] Office: [phone]

## 2018-07-23 NOTE — Progress Notes (Signed)
Pt received from HD. Pt lethargic and having body twitches. Pt reports that "I feel weird." VSS 89/50 (61) HR 115. 88 on RA. RR 24. Dagoberto Ligas PA paged and came to beside to assess. 3L Kyle applied. EKG obtained. Stat chest XRAY and troponin ordered. Will continue to monitor.  Clyde Canterbury, RN

## 2018-07-23 NOTE — Progress Notes (Signed)
PT Cancellation Note  Patient Details Name: Jose Garrett MRN: [id number] DOB: [birth year]   Cancelled Treatment:    Reason Eval/Treat Not Completed: Patient at procedure or test/unavailable(Pt in HD.  Will return as able. )   Denice Paradise 07/23/2018, 9:06 AM  Melesa Lecy,PT Acute Rehabilitation Services Pager:  [phone]  Office:  [phone]

## 2018-07-23 NOTE — Progress Notes (Signed)
CRITICAL VALUE ALERT  Critical Value:  Troponin  Date & Time Notied: 07/23/2018 [place]  Provider Notified: Dagoberto Ligas PA

## 2018-07-23 NOTE — Progress Notes (Signed)
ANTICOAGULATION CONSULT NOTE - Initial Consult  Pharmacy Consult for Heparin Indication: chest pain/ACS  No Known Allergies  Patient Measurements: Height: 5\' 10"  (177.8 cm) Weight: 195 lb 15.8 oz (88.9 kg) IBW/kg (Calculated) : 73 Heparin Dosing Weight:  88.9 kg  Vital Signs: Temp: 98.9 F (37.2 C) (05/13 1141) Temp Source: Oral (05/13 1141) BP: 89/50 (05/13 1153) Pulse Rate: 87 (05/13 1153)  Labs: Recent Labs    07/22/18 0623 07/23/18 0242  HGB 9.7* 7.3*  HCT 29.8* 22.5*  PLT 134* 140*  APTT 45*  --   LABPROT 15.4*  --   INR 1.2  --   CREATININE 7.72* 9.61*    Estimated Creatinine Clearance: 8.5 mL/min (A) (by C-G formula based on SCr of 9.61 mg/dL (H)).   Medical History: Past Medical History:  Diagnosis Date  . Amyloidosis ([place])   . Anemia Dx 2016  . Anxiety    "especially with procedure"- 07/21/2018  . CAD (coronary artery disease)    a. cardiac cath on 11/25/17 with occlusion of the RCA which could have been his event but the RCA could not be engaged. The RCA filled distally from left to right collaterals. There were no severe stenoses in the left coronary system. Medical therapy recommended, started on plavix.  . Chronic combined systolic and diastolic CHF (congestive heart failure) ([place])    a. EF dropped to 35-40% with grade 2 DD by echo 11/2017.  [name] Diarrhea   . ESRD (end stage renal disease) ([place])    ESRD- HEMO MWF- [facility]  . Family history of adverse reaction to anesthesia    "my daughter can't take certain anesthesia agents" (09/08/2014)  . GERD (gastroesophageal reflux disease)   . Heart murmur   . History of blood transfusion 09/08/2014   "got hematoma after renal biopsy & HgB dropped"  . Hyperlipidemia Dx 2012  . Hypertension Dx 2012  . Myocardial infarction ([place]) 11/2017  . Neuropathy   . Peripheral vascular disease ([place])   . Restless legs    Assessment: CC/HPI: CP PAD with critical limb ischemia - left lower extremity, s/p left common  femoral to below knee popliteal artery bypass graft 5/12  PMH: AL amyloidosis now with ESRD MWF, ICM, PAD, ACD, 2nd HPT, HTN, GERD, HLD, RLS, h/o thrombocyotpenia, anxiety, CAD, CHF, neuropathy,   Anticoag: Start IV heparin for CP. Left lower extremity critical limb ischemia; s/p left common femoral to below knee popliteal artery bypass graft 5/12. Hgb 9.7>7.3. Plts 140  Goal of Therapy:  Heparin level 0.3-0.7 units/ml Monitor platelets by anticoagulation protocol: Yes   Plan:  Heparin (no bolus per MD request) 1250 units/hr Check HL in 6 hrs Daily HL and CBC   Brandon Scarbrough S. Alford Highland, PharmD, BCPS Clinical Staff Pharmacist Eilene Ghazi Stilling[place] 07/23/2018,12:53 PM

## 2018-07-23 NOTE — Progress Notes (Signed)
CRITICAL VALUE ALERT  Critical Value:  Trop 5.25  Date & Time Notied:  8:05pm by charge RN stephanie   Provider Notified: [facility] 8:12pm  Orders Received/Actions taken no further actions no current Chest pain and currently on Hep gtt at 14.5

## 2018-07-23 NOTE — Consult Note (Signed)
Cardiology Consultation:   Patient ID: Jose Garrett MRN: [id number]; DOB: 1950-08-10  Admit date: 07/22/2018 Date of Consult: 07/23/2018  Primary Care Provider: Patient, No Pcp Per Primary Cardiologist: Lauree Chandler, MD  Primary Electrophysiologist:  None    Patient Profile:   Jose Garrett is a 68 y.o. male with a hx of ESRD on HD, AL amyloid, aortic stenosis, HTN, GERD, anemia, combined HF and CAD who is being seen today for the evaluation of elevated troponin at the request of Dr. Donzetta Matters.  History of Present Illness:   Jose Garrett is a 68 yo male with PMH noted above. He is followed by Dr. Angelena Form. He was admitted back in 9/19 with respiratory failure and had elevated troponin with diffuse ST changes. Underwent cardiac cath showing occlusion of RCA (felt to be culprit) but could not be engaged. RCA filled from collaterals. Moderate disease in the left LCx and mild disease in the LAD. Medical therapy was recommended. EF noted at 35-40% with moderate aortic stenosis. Follow up echo in 10/19 showed improved EF to 65%. His coreg and ACEi was stopped 2/2 hypotension with dialysis. Amyloid is followed by oncology. Prior tx with CyBorD, Velcade, Ixazomib, and Dexamethasone. Bone marrow with no overt evidence of myeloma. Last cardiac cath 03/25/18 with stable CAD.   Last visit with Dr. Angelena Form was a tele-health visit on 07/03/18. Reported to have continued issues with hypotension with HD. Remained off BB and ACEi therapy. Continued on ASA, statin, plavix. In regards to his AS, it was felt he would likely need TAVR within the next year.   He had a referral to Dr. Fletcher Anon for his PVD. Reported progressive discoloration in his toes on the left side. Underwent noninvasive studies which detected noncompressible vessels with no flow detected in the left toes with dampened flow in the right toes. Underwent outpatient PV angiogram with Dr. Fletcher Anon with occlusion of bilateral SFAs but gangrenous changes on  the left side. It was felt he would be best served as a surgical candidate. Seen by Dr. Oneida Alar who recommended he have vein mapping.    Presented on 07/22/18 with successful left common femoral popliteal bypass graft with Dr. Donzetta Matters. Today after HD had jerking movements, was hypotensive and short of breath. EKG showed ST with RBBB. Troponin 0.09, Hgb 7.3, Cr 9.61, K+ 5.9.   Past Medical History:  Diagnosis Date   Amyloidosis ([place])    Anemia Dx 2016   Anxiety    "especially with procedure"- 07/21/2018   CAD (coronary artery disease)    a. cardiac cath on 11/25/17 with occlusion of the RCA which could have been his event but the RCA could not be engaged. The RCA filled distally from left to right collaterals. There were no severe stenoses in the left coronary system. Medical therapy recommended, started on plavix.   Chronic combined systolic and diastolic CHF (congestive heart failure) ([place])    a. EF dropped to 35-40% with grade 2 DD by echo 11/2017.   Diarrhea    ESRD (end stage renal disease) ([place])    ESRD- HEMO MWF- [facility]   Family history of adverse reaction to anesthesia    "my daughter can't take certain anesthesia agents" (09/08/2014)   GERD (gastroesophageal reflux disease)    Heart murmur    History of blood transfusion 09/08/2014   "got hematoma after renal biopsy & HgB dropped"   Hyperlipidemia Dx 2012   Hypertension Dx 2012   Myocardial infarction ([place]) 11/2017   Neuropathy  Peripheral vascular disease ([place])    Restless legs     Past Surgical History:  Procedure Laterality Date   ABDOMINAL AORTOGRAM W/LOWER EXTREMITY Bilateral 07/16/2018   Procedure: ABDOMINAL AORTOGRAM W/LOWER EXTREMITY;  Surgeon: Wellington Hampshire, MD;  Location: [facility];  Service: Cardiovascular;  Laterality: Bilateral;   AV FISTULA PLACEMENT Right 12/27/2015   Procedure: Right Arm ARTERIOVENOUS (AV) FISTULA CREATION;  Surgeon: Angelia Mould, MD;  Location: [facility];   Service: Vascular;  Laterality: Right;   COLONOSCOPY     FEMORAL-POPLITEAL BYPASS GRAFT Left 07/22/2018   Procedure: left FEMORAL TO BELOW KNEE POPLITEAL ARTERY BYPASS GREAFT using 32mm removable ring propaten gore graft;  Surgeon: Waynetta Sandy, MD;  Location: [facility];  Service: Vascular;  Laterality: Left;   IR GENERIC HISTORICAL  01/27/2016   IR FLUORO GUIDE CV LINE RIGHT 01/27/2016 Arne Cleveland, MD [facility]   IR GENERIC HISTORICAL  01/27/2016   IR US GUIDE VASC ACCESS RIGHT 01/27/2016 Arne Cleveland, MD [facility]   LAPAROSCOPIC CHOLECYSTECTOMY  03/2011   LEFT HEART CATH AND CORONARY ANGIOGRAPHY N/A 11/25/2017   Procedure: LEFT HEART CATH AND CORONARY ANGIOGRAPHY;  Surgeon: Martinique, Peter M, MD;  Location: [facility];  Service: Cardiovascular;  Laterality: N/A;   RENAL BIOPSY, PERCUTANEOUS Right 09/08/2014   RIGHT/LEFT HEART CATH AND CORONARY ANGIOGRAPHY N/A 03/25/2018   Procedure: RIGHT/LEFT HEART CATH AND CORONARY ANGIOGRAPHY;  Surgeon: Martinique, Peter M, MD;  Location: [facility];  Service: Cardiovascular;  Laterality: N/A;   TONSILLECTOMY  ~ [place]  03/25/2018   Procedure: Ultrasound Guidance For Vascular Access;  Surgeon: Martinique, Peter M, MD;  Location: [facility];  Service: Cardiovascular;;     Home Medications:  Prior to Admission medications   Medication Sig Start Date End Date Taking? Authorizing Provider  acetaminophen (TYLENOL) 500 MG tablet Take 500-1,000 mg by mouth daily as needed (pain).    Yes [provider]  aspirin EC 81 MG tablet Take 1 tablet (81 mg total) by mouth daily. 11/30/17  Yes Cheryln Manly, NP  atorvastatin (LIPITOR) 80 MG tablet Take 80 mg by mouth daily with supper.    Yes [provider]  cetirizine (ZYRTEC) 10 MG tablet Take 10 mg by mouth daily.   Yes [provider]  clopidogrel (PLAVIX) 75 MG tablet Take 75 mg by mouth daily with  supper.   Yes [provider]  ethyl chloride spray Apply 1 application topically See admin instructions. Spray on arm immediately prior to dialysis on Monday, Wednesday and Friday   Yes [provider]  gabapentin (NEURONTIN) 300 MG capsule TAKE 1 CAPSULE BY MOUTH EVERY MORNING AND 3 CAPSULES EACH NIGHT AT BEDTIME Patient taking differently: Take 300-900 mg by mouth See admin instructions. Take 1 capsule (300 mg) by mouth in the morning & take 3 capsules (900 mg) by mouth at bedtime 01/07/18  Yes Cameron Sprang, MD  oxyCODONE (OXY IR/ROXICODONE) 5 MG immediate release tablet Take 1-2 tablets (5-10 mg total) by mouth every 6 (six) hours as needed for severe pain. Patient taking differently: Take 5 mg by mouth daily as needed for moderate pain.  10/02/17  Yes [name], MD  oxyCODONE (OXYCONTIN) 10 mg 12 hr tablet Take 10 mg by mouth daily as needed (severe pain).    Yes [provider]  sucroferric oxyhydroxide (VELPHORO) 500 MG chewable tablet Chew 500-1,500 mg by mouth See  admin instructions. Take 1 tablet (500 mg) by mouth with a small meal or snack & take 2 or 3 tablets (1000 - 1500 mg) by mouth with large meals   Yes [provider]  vitamin B-12 (CYANOCOBALAMIN) 1000 MCG tablet Take 1,000 mcg by mouth daily.   Yes [provider]    Inpatient Medications: Scheduled Meds:  sodium chloride   Intravenous Once   aspirin EC  81 mg Oral Daily   atorvastatin  80 mg Oral Q supper   clopidogrel  75 mg Oral Q supper   darbepoetin (ARANESP) injection - DIALYSIS  60 mcg Intravenous Q Wed-HD   docusate sodium  100 mg Oral Daily   gabapentin  300 mg Oral q morning - 10a   loratadine  10 mg Oral Daily   pantoprazole  40 mg Oral Daily   sucroferric oxyhydroxide  1,000 mg Oral TID WC   sucroferric oxyhydroxide  500 mg Oral With snacks   vitamin B-12  1,000 mcg Oral Daily   Continuous Infusions:  sodium chloride     ferric  gluconate (FERRLECIT/NULECIT) IV Stopped (07/23/18 1140)   heparin 1,450 Units/hr (07/23/18 1500)   magnesium sulfate bolus IVPB     PRN Meds: sodium chloride, acetaminophen **OR** acetaminophen, bisacodyl, guaiFENesin-dextromethorphan, hydrALAZINE, labetalol, magnesium sulfate bolus IVPB, metoprolol tartrate, ondansetron, oxyCODONE, oxyCODONE, phenol, polyethylene glycol  Allergies:   No Known Allergies  Social History:   Social History   Socioeconomic History   Marital status: Divorced    Spouse name: Not on file   Number of children: Not on file   Years of education: Not on file   Highest education level: Not on file  Occupational History   Not on file  Social Needs   Financial resource strain: Not on file   Food insecurity:    Worry: Not on file    Inability: Not on file   Transportation needs:    Medical: Not on file    Non-medical: Not on file  Tobacco Use   Smoking status: Former Smoker    Packs/day: 2.00    Years: 40.00    Pack years: 80.00    Types: Cigarettes    Last attempt to quit: 03/19/2011    Years since quitting: 7.3   Smokeless tobacco: Never Used  Substance and Sexual Activity   Alcohol use: No    Alcohol/week: 0.0 standard drinks   Drug use: No    Comment: 09/08/2014 "I have used marijuana till the 1990's". Notes that he quit 15 yrs ago   Sexual activity: Not Currently  Lifestyle   Physical activity:    Days per week: Not on file    Minutes per session: Not on file   Stress: Not on file  Relationships   Social connections:    Talks on phone: Not on file    Gets together: Not on file    Attends religious service: Not on file    Active member of club or organization: Not on file    Attends meetings of clubs or organizations: Not on file    Relationship status: Not on file   Intimate partner violence:    Fear of current or ex partner: Not on file    Emotionally abused: Not on file    Physically abused: Not on file    Forced  sexual activity: Not on file  Other Topics Concern   Not on file  Social History Narrative   Pt lives alone in a 1 story  home   Has 3 adult daughters   Highest level of education: associates degree   Worked in [employer], broadcasting/film/video.     Family History:    Family History  Problem Relation Age of Onset   Hypertension Mother    Diabetes Mother    Heart disease Mother    Skin cancer Mother    Hypertension Father    Diabetes Father    Diabetes Sister      ROS:  Please see the history of present illness.   All other ROS reviewed and negative.     Physical Exam/Data:   Vitals:   07/23/18 1151 07/23/18 1153 07/23/18 1333 07/23/18 1603  BP: (!) 92/51 (!) 89/50 (!) 90/53 (!) 100/56  Pulse: 81 87 (!) 114   Resp: (!) 22 19 (!) 21 18  Temp:   99 F (37.2 C) 97.7 F (36.5 C)  TempSrc:   Oral Oral  SpO2: 92% 91% 97% 100%  Weight:      Height:        Intake/Output Summary (Last 24 hours) at 07/23/2018 1607 Last data filed at 07/23/2018 1500 Gross per 24 hour  Intake 619.45 ml  Output 138 ml  Net 481.45 ml   Last 3 Weights 07/23/2018 07/23/2018 07/23/2018  Weight (lbs) 195 lb 15.8 oz 196 lb 13.9 oz 197 lb 5 oz  Weight (kg) 88.9 kg 89.3 kg 89.5 kg     Body mass index is 28.12 kg/m.   Physical Exam per MD.  General:  Well nourished, well developed, appears uncomfortable HEENT: normal Lymph: no adenopathy Neck: no JVD Endocrine:  No thryomegaly Vascular: No carotid bruits; FA pulses 2+ bilaterally without bruits  Cardiac:  normal S1, S2; RRR; no murmur  Lungs:  clear to auscultation bilaterally, no wheezing, rhonchi or rales  Abd: soft, nontender, no hepatomegaly  Ext: no edema Musculoskeletal:  No deformities, BUE and BLE strength normal and equal ; left leg bandage Skin: warm and dry  Neuro:  CNs 2-12 intact, no focal abnormalities noted Psych:  Normal affect   EKG:  The EKG was personally reviewed and demonstrates:   Telemetry:  Telemetry was personally  reviewed and demonstrates:  Sinus tach  Relevant CV Studies:  Cath: 03/25/18   Prox LAD lesion is 40% stenosed.  Ost Cx to Prox Cx lesion is 65% stenosed.  Ost RCA to Mid RCA lesion is 100% stenosed.  Ramus lesion is 40% stenosed.  LV end diastolic pressure is normal.  There is moderate aortic valve stenosis.   1. Single vessel occlusive CAD with 100% ostial RCA. Left to right collaterals to the RCA. Moderate stenosis at the ostium of the LCx. These findings are unchanged from September 2019.  2. Normal LV filling pressures 3. Normal right heart pressures 4. Moderate aortic stenosis. Mean gradient 34 mm Hg. AVA 1.1 cm squared with an index of 0.56.  5. Normal cardiac output. 7.88 L/min with index 3.92.  TTE: 03/24/18  Study Conclusions  - Left ventricle: The cavity size was normal. Wall thickness was   increased in a pattern of moderate LVH. Systolic function was   mildly to moderately reduced. The estimated ejection fraction was   in the range of 40% to 45%. Diffuse hypokinesis. Doppler   parameters are consistent with abnormal left ventricular   relaxation (grade 1 diastolic dysfunction). The E/e&' ratio is   >15, suggesting elevated LV filling pressure. - Aortic valve: Calcified with moderate stenosis. Mean gradient   (S): 30 mm Hg.  Max gradient (S): 47 mmHg. Valve area (VTI): 1   cm^2. Valve area (Vmean): 1.08 cm^2. - Mitral valve: Calcified annulus. Mildly thickened leaflets .   There was mild regurgitation. - Left atrium: The atrium was normal in size. - Inferior vena cava: The vessel was normal in size. The   respirophasic diameter changes were in the normal range (>= 50%),   consistent with normal central venous pressure.  Impressions:  - Compared to a prior study in 12/2017, the LVEF is lower at 40-45%   with global hypokinesis, elevated LV filling pressure and at   least moderate aortic stenosis - mean gradient of 30 mmHg   (consider low flow, low  gradient severe AS).   Laboratory Data:  Chemistry Recent Labs  Lab 07/22/18 0623 07/23/18 0242  NA 139 138  K 4.0 5.9*  CL 89* 94*  CO2 25 29  GLUCOSE 118* 126*  BUN 30* 47*  CREATININE 7.72* 9.61*  CALCIUM 9.1 9.2  GFRNONAA 7* 5*  GFRAA 8* 6*  ANIONGAP 25* 15    Recent Labs  Lab 07/22/18 0623  PROT 6.8  ALBUMIN 3.4*  AST 16  ALT 14  ALKPHOS 55  BILITOT 0.7   Hematology Recent Labs  Lab 07/22/18 0623 07/23/18 0242  WBC 5.5 8.5  RBC 2.92* 2.24*  HGB 9.7* 7.3*  HCT 29.8* 22.5*  MCV 102.1* 100.4*  MCH 33.2 32.6  MCHC 32.6 32.4  RDW 14.5 14.5  PLT 134* 140*   Cardiac Enzymes Recent Labs  Lab 07/23/18 1239  TROPONINI 0.09*   No results for input(s): TROPIPOC in the last 168 hours.  BNPNo results for input(s): BNP, PROBNP in the last 168 hours.  DDimer No results for input(s): DDIMER in the last 168 hours.  Radiology/Studies:  Dg Chest Port 1 View  Result Date: 07/23/2018 CLINICAL DATA:  68 year old male with a history of shortness of breath EXAM: PORTABLE CHEST 1 VIEW COMPARISON:  03/23/2018 FINDINGS: Cardiomediastinal silhouette unchanged in size and contour. Mild interlobular septal thickening. No confluent airspace disease. No pleural effusion or pneumothorax. Coarsened interstitial markings. IMPRESSION: Pattern of mild interlobular septal thickening, potentially chronic from prior episodes of CHF versus mild acute edema. No evidence of lobar pneumonia. Electronically Signed   By: Corrie Mckusick D.O.   On: 07/23/2018 12:45    Assessment and Plan:   BRODIN GELPI is a 68 y.o. male with a hx of ESRD on HD, AL amyloid, aortic stenosis, HTN, GERD, anemia, combined HF and CAD who is being seen today for the evaluation of elevated troponin at the request of Dr. Donzetta Matters.  1. Elevated Troponin: Suspect demand ischemia in the setting of acute anemia and ESRD on HD. He has known CAD with occluded RCA filling via collaterals from cath back in January. For now would  plan to treat medically. -- trend troponin -- EKG in the am -- on ASA, statin, plavix. Placed on IV heparin via primary, though with anemia may need to hold for now unless significant troponin elevation.   2. PVD: s/p left common femoral popliteal artery bypass graft via Dr. Donzetta Matters.   3. Anemia: Hgb 7.3. Transfuse per primary. With hx of CAD ideally would like to transfuse to Hgb of 8  4. ESRD on HD: nephrology following. Had HD this morning and dropped his bp into the 01V systolic. Hx of the same as a result has not been on any antihypertensives PTA.    For questions or updates, please contact [place]  Please consult [email or web address] for contact info under    Signed, Reino Bellis, NP  07/23/2018 4:07 PM   I have examined the patient and reviewed assessment and plan and discussed with patient.  Agree with above as stated.    I reviewed the cath results from Jan 2020.  Moderate disease with CTO RCA.  COuld have demand ischemia under the stress of recent surgery, anemia, increased HR.  His pain is more in his back and he has a twitching in his lower body.  ECG shows sinus tach.  Chronic QRS widening and ST changes.  No acute changes.  Cycle troponins.  Given ESRD, current low level positive result is not unexpected.  Transfused earlier today so will recheck CBC as well.    If BP increases, would give low dose beta blocker to help with HR.  Given twitching and low BP, ? Whether he is a little dehydrated.  Doubt ACS at this time.  Further enzyme resutls will guide future therapy.   Checking for DVT.  If no DVT, OK to hold heparin if troponin trend stays flat.  Larae Grooms

## 2018-07-23 NOTE — Progress Notes (Signed)
[facility] for Heparin Indication: chest pain/ACS  No Known Allergies  Patient Measurements: Height: 5\' 10"  (177.8 cm) Weight: 195 lb 15.8 oz (88.9 kg) IBW/kg (Calculated) : 73 Heparin Dosing Weight:  88.9 kg  Vital Signs: Temp: 97.7 F (36.5 C) (05/13 1603) Temp Source: Oral (05/13 1603) BP: 100/56 (05/13 1603) Pulse Rate: 114 (05/13 1333)  Labs: Recent Labs    07/22/18 0623 07/23/18 0242 07/23/18 1239 07/23/18 1849  HGB 9.7* 7.3*  --  7.4*  HCT 29.8* 22.5*  --  21.5*  PLT 134* 140*  --  122*  APTT 45*  --   --   --   LABPROT 15.4*  --   --   --   INR 1.2  --   --   --   HEPARINUNFRC  --   --   --  0.35  CREATININE 7.72* 9.61*  --   --   TROPONINI  --   --  0.09* 5.25*    Estimated Creatinine Clearance: 8.5 mL/min (A) (by C-G formula based on SCr of 9.61 mg/dL (H)).   Medical History: Past Medical History:  Diagnosis Date  . Amyloidosis ([place])   . Anemia Dx 2016  . Anxiety    "especially with procedure"- 07/21/2018  . CAD (coronary artery disease)    a. cardiac cath on 11/25/17 with occlusion of the RCA which could have been his event but the RCA could not be engaged. The RCA filled distally from left to right collaterals. There were no severe stenoses in the left coronary system. Medical therapy recommended, started on plavix.  . Chronic combined systolic and diastolic CHF (congestive heart failure) ([place])    a. EF dropped to 35-40% with grade 2 DD by echo 11/2017.  [name] Diarrhea   . ESRD (end stage renal disease) ([place])    ESRD- HEMO MWF- [facility]  . Family history of adverse reaction to anesthesia    "my daughter can't take certain anesthesia agents" (09/08/2014)  . GERD (gastroesophageal reflux disease)   . Heart murmur   . History of blood transfusion 09/08/2014   "got hematoma after renal biopsy & HgB dropped"  . Hyperlipidemia Dx 2012  . Hypertension Dx 2012  . Myocardial infarction ([place]) 11/2017  . Neuropathy   .  Peripheral vascular disease ([place])   . Restless legs    Assessment: CC/HPI: CP PAD with critical limb ischemia - left lower extremity, s/p left common femoral to below knee popliteal artery bypass graft 5/12  PMH: AL amyloidosis now with ESRD MWF, ICM, PAD, ACD, 2nd HPT, HTN, GERD, HLD, RLS, h/o thrombocyotpenia, anxiety, CAD, CHF, neuropathy,   Anticoag: Patient has been receiving heparin infusion at 1450 units/hr for CP; ~6 hr HL 0.35 units/ml, which is within desired goal range. Left lower extremity critical limb ischemia; s/p left common femoral to below knee popliteal artery bypass graft 5/12. Hgb 9.7>7.4. Plts 122.  Goal of Therapy:  Heparin level 0.3-0.7 units/ml Monitor platelets by anticoagulation protocol: Yes   Plan:  Continue heparin infusion at 1450 units/hr Check HL with 0500 labs Daily HL and CBC   Gillermina Hu, PharmD, BCPS, [facility] Clinical Pharmacist 07/23/2018,8:06 PM

## 2018-07-23 NOTE — Progress Notes (Addendum)
[facility] Progress Note  Name: Jose Garrett MRN: [id number] DOB: 18-Mar-1950  Chief Complaint:  Left lower extremity critical limb ischemia; planned surgery   Subjective:  Seen on HD and procedure supervised.  BP 97/45 and HR 82.  Increased UF goal from net even to 1 kg and not tolerating - albumin given       Review of systems:  Denies shortness of breath or chest pain  Denies nausea or vomiting    Intake/Output Summary (Last 24 hours) at 07/23/2018 0837 Last data filed at 07/22/2018 1756 Gross per 24 hour  Intake 1130 ml  Output 150 ml  Net 980 ml    Vitals:  Vitals:   07/23/18 0700 07/23/18 0705 07/23/18 0730 07/23/18 0800  BP: (!) 121/57 (!) 116/56 (!) 93/45 (!) 98/44  Pulse: 82 80 82 87  Resp:      Temp:      TempSrc:      SpO2:      Weight:      Height:         Physical Exam:  General adult male in bed in no acute distress HEENT normocephalic atraumatic extraocular movements intact sclera anicteric Neck supple trachea midline Lungs clear to auscultation bilaterally normal work of breathing at rest  Heart S1S2; no rub  Abdomen soft nontender nondistended Extremities no pitting edema  Psych normal mood and affect Access: RUE AVF in use    Medications reviewed   Labs:  BMP Latest Ref Rng & Units 07/23/2018 07/22/2018 07/14/2018  Glucose 70 - 99 mg/dL 126(H) 118(H) 155(H)  BUN 8 - 23 mg/dL 47(H) 30(H) 76(HH)  Creatinine 0.61 - 1.24 mg/dL 9.61(H) 7.72(H) 13.07(H)  BUN/Creat Ratio 10 - 24 - - 6(L)  Sodium 135 - 145 mmol/L 138 139 135  Potassium 3.5 - 5.1 mmol/L 5.9(H) 4.0 5.4(H)  Chloride 98 - 111 mmol/L 94(L) 89(L) 93(L)  CO2 22 - 32 mmol/L 29 25 27   Calcium 8.9 - 10.3 mg/dL 9.2 9.1 9.4     Assessment/Plan:   Pt with ESRD on HD MWF, ischemic cardiomyopathy, PAD presented to the hospital for left common femoral to below knee popliteal artery bypass graft for critical left lower extremity ischemia  # ESRD - HD today per MWF schedule  -  Would limit gabapentin to no more than 300 mg daily given ESRD.  Please avoid morphine for pain control   # Hyperkalemia - 2 K bath with HD   # PAD with critical limb ischemia - left lower extremity  - s/p left common femoral to below knee popliteal artery bypass graft  # Ischemic cardiomyopathy  - Optimizing volume with HD   # Anemia  - Secondary in part to ESRD as well as acute blood loss from procedure  - on aranesp and nulecit  - Transfuse 1 unit PRBC's   # Secondary hyperparathyroidism, metabolic bone disease  - On velphoro   Claudia Desanctis, MD 07/23/2018 8:37 AM

## 2018-07-23 NOTE — Progress Notes (Signed)
[facility]  Subjective  - does not feel well, pt noted to be mildly hypotensive short of breath with some involuntary jerks torso after HD   Objective (!) 89/50 87 98.9 F (37.2 C) (Oral) 19 91%  Intake/Output Summary (Last 24 hours) at 07/23/2018 1257 Last data filed at 07/23/2018 1101 Gross per 24 hour  Intake 595 ml  Output 138 ml  Net 457 ml   Abdomen soft non tender Chest clear 4 L O2 Left leg incisions clean foot warm doppler signals some edema 20% larger than right  Assessment/Planning: 12 lead EKG done similar to preop Troponin pending Will put on heparin drip for now If rule out MI will get Duplex to rule out DVT PE left leg  Ruta Hinds 07/23/2018 12:57 PM --  Laboratory Lab Results: Recent Labs    07/22/18 0623 07/23/18 0242  WBC 5.5 8.5  HGB 9.7* 7.3*  HCT 29.8* 22.5*  PLT 134* 140*   BMET Recent Labs    07/22/18 0623 07/23/18 0242  NA 139 138  K 4.0 5.9*  CL 89* 94*  CO2 25 29  GLUCOSE 118* 126*  BUN 30* 47*  CREATININE 7.72* 9.61*  CALCIUM 9.1 9.2    COAG Lab Results  Component Value Date   INR 1.2 07/22/2018   INR 1.27 01/23/2016   INR 1.10 (L) 01/21/2015   PROTIME 13.2 01/21/2015   Lab Results  Component Value Date   PTT 48 (H) 10/11/2014

## 2018-07-24 ENCOUNTER — Inpatient Hospital Stay (HOSPITAL_COMMUNITY): Payer: Medicare Other

## 2018-07-24 ENCOUNTER — Inpatient Hospital Stay (HOSPITAL_COMMUNITY)
Admission: RE | Disposition: A | Discharge status: Rehabilitation Facility | Payer: Self-pay | Source: Home / Self Care | Attending: Vascular Surgery

## 2018-07-24 ENCOUNTER — Encounter (HOSPITAL_COMMUNITY): Payer: Self-pay | Admitting: Cardiovascular Disease

## 2018-07-24 DIAGNOSIS — I739 Peripheral vascular disease, unspecified: Secondary | ICD-10-CM

## 2018-07-24 DIAGNOSIS — I214 Non-ST elevation (NSTEMI) myocardial infarction: Secondary | ICD-10-CM

## 2018-07-24 DIAGNOSIS — I2510 Atherosclerotic heart disease of native coronary artery without angina pectoris: Secondary | ICD-10-CM

## 2018-07-24 HISTORY — PX: LEFT HEART CATH AND CORONARY ANGIOGRAPHY: CATH118249

## 2018-07-24 LAB — TYPE AND SCREEN
ABO/RH(D): O POS
Antibody Screen: NEGATIVE
Unit division: 0

## 2018-07-24 LAB — POCT ACTIVATED CLOTTING TIME: Activated Clotting Time: 147 seconds

## 2018-07-24 LAB — TROPONIN I: Troponin I: >65 ng/mL (ref <0.03)

## 2018-07-24 LAB — RENAL FUNCTION PANEL
Albumin: 3.6 g/dL (ref 3.5–5.0)
Anion gap: 16 — ABNORMAL HIGH (ref 5–15)
BUN: 29 mg/dL — ABNORMAL HIGH (ref 8–23)
CO2: 28 mmol/L (ref 22–32)
Calcium: 9.5 mg/dL (ref 8.9–10.3)
Chloride: 93 mmol/L — ABNORMAL LOW (ref 98–111)
Creatinine, Ser: 6.24 mg/dL — ABNORMAL HIGH (ref 0.61–1.24)
GFR calc Af Amer: 10 mL/min — ABNORMAL LOW (ref >60)
GFR calc non Af Amer: 9 mL/min — ABNORMAL LOW (ref >60)
Glucose, Bld: 135 mg/dL — ABNORMAL HIGH (ref 70–99)
Phosphorus: 7.1 mg/dL — ABNORMAL HIGH (ref 2.5–4.6)
Potassium: 5.4 mmol/L — ABNORMAL HIGH (ref 3.5–5.1)
Sodium: 137 mmol/L (ref 135–145)

## 2018-07-24 LAB — CBC
HCT: 24 % — ABNORMAL LOW (ref 39.0–52.0)
Hemoglobin: 8 g/dL — ABNORMAL LOW (ref 13.0–17.0)
MCH: 32.3 pg (ref 26.0–34.0)
MCHC: 33.3 g/dL (ref 30.0–36.0)
MCV: 96.8 fL (ref 80.0–100.0)
Platelets: 130 10*3/uL — ABNORMAL LOW (ref 150–400)
RBC: 2.48 MIL/uL — ABNORMAL LOW (ref 4.22–5.81)
RDW: 17.1 % — ABNORMAL HIGH (ref 11.5–15.5)
WBC: 10.8 10*3/uL — ABNORMAL HIGH (ref 4.0–10.5)
nRBC: 0 % (ref 0.0–0.2)

## 2018-07-24 LAB — HEPARIN LEVEL (UNFRACTIONATED): Heparin Unfractionated: 0.58 IU/mL (ref 0.30–0.70)

## 2018-07-24 LAB — BPAM RBC
Blood Product Expiration Date: 202005312359
ISSUE DATE / TIME: 202005130947
Unit Type and Rh: 5100

## 2018-07-24 SURGERY — LEFT HEART CATH AND CORONARY ANGIOGRAPHY
Anesthesia: LOCAL

## 2018-07-24 MED ORDER — LABETALOL HCL 5 MG/ML IV SOLN: 10.0000 mg | INTRAVENOUS | Status: AC | PRN

## 2018-07-24 MED ORDER — SODIUM CHLORIDE 0.9% FLUSH
3.0000 mL | Freq: Two times a day (BID) | INTRAVENOUS | Status: DC
  Administered 2018-07-25 – 2018-07-28 (×5): 3 mL via INTRAVENOUS

## 2018-07-24 MED ORDER — MIDAZOLAM HCL 2 MG/2ML IJ SOLN
INTRAMUSCULAR | Status: AC
  Filled 2018-07-24: qty 2

## 2018-07-24 MED ORDER — VERAPAMIL HCL 2.5 MG/ML IV SOLN
INTRAVENOUS | Status: AC
  Filled 2018-07-24: qty 2

## 2018-07-24 MED ORDER — FENTANYL CITRATE (PF) 100 MCG/2ML IJ SOLN
INTRAMUSCULAR | Status: DC | PRN
  Administered 2018-07-24: 25 ug via INTRAVENOUS

## 2018-07-24 MED ORDER — HEPARIN (PORCINE) IN NACL 1000-0.9 UT/500ML-% IV SOLN
INTRAVENOUS | Status: DC | PRN
  Administered 2018-07-24: 500 mL

## 2018-07-24 MED ORDER — SODIUM CHLORIDE 0.9 % IV SOLN: Freq: Once | INTRAVENOUS | Status: DC

## 2018-07-24 MED ORDER — SODIUM CHLORIDE 0.9% FLUSH
3.0000 mL | Freq: Two times a day (BID) | INTRAVENOUS | Status: DC
  Administered 2018-07-25 – 2018-07-27 (×3): 3 mL via INTRAVENOUS

## 2018-07-24 MED ORDER — SODIUM CHLORIDE 0.9% FLUSH: 3.0000 mL | INTRAVENOUS | Status: DC | PRN

## 2018-07-24 MED ORDER — HEPARIN (PORCINE) IN NACL 1000-0.9 UT/500ML-% IV SOLN
INTRAVENOUS | Status: AC
  Filled 2018-07-24: qty 500

## 2018-07-24 MED ORDER — SODIUM CHLORIDE 0.9 % IV SOLN: 250.0000 mL | INTRAVENOUS | Status: DC | PRN

## 2018-07-24 MED ORDER — HEPARIN SODIUM (PORCINE) 5000 UNIT/ML IJ SOLN
5000.0000 [IU] | Freq: Three times a day (TID) | INTRAMUSCULAR | Status: DC
  Administered 2018-07-25 – 2018-07-28 (×10): 5000 [IU] via SUBCUTANEOUS
  Filled 2018-07-24 (×10): qty 1

## 2018-07-24 MED ORDER — LIDOCAINE HCL (PF) 1 % IJ SOLN
INTRAMUSCULAR | Status: DC | PRN
  Administered 2018-07-24: 15 mL

## 2018-07-24 MED ORDER — FENTANYL CITRATE (PF) 100 MCG/2ML IJ SOLN
INTRAMUSCULAR | Status: AC
  Filled 2018-07-24: qty 2

## 2018-07-24 MED ORDER — HYDRALAZINE HCL 20 MG/ML IJ SOLN: 10.0000 mg | INTRAMUSCULAR | Status: AC | PRN

## 2018-07-24 MED ORDER — PATIROMER SORBITEX CALCIUM 8.4 G PO PACK
16.8000 g | PACK | Freq: Every day | ORAL | Status: AC
  Administered 2018-07-24: 23:00:00 16.8 g via ORAL
  Filled 2018-07-24 (×2): qty 2

## 2018-07-24 MED ORDER — HEPARIN (PORCINE) IN NACL 1000-0.9 UT/500ML-% IV SOLN
INTRAVENOUS | Status: AC
  Filled 2018-07-24: qty 1000

## 2018-07-24 MED ORDER — SODIUM CHLORIDE 0.9 % IV SOLN
INTRAVENOUS | Status: AC | PRN
  Administered 2018-07-24: 10 mL/h via INTRAVENOUS

## 2018-07-24 MED ORDER — MIDAZOLAM HCL 2 MG/2ML IJ SOLN
INTRAMUSCULAR | Status: DC | PRN
  Administered 2018-07-24: 1 mg via INTRAVENOUS

## 2018-07-24 MED ORDER — IOHEXOL 350 MG/ML SOLN
INTRAVENOUS | Status: DC | PRN
  Administered 2018-07-24: 90 mL via INTRAVENOUS

## 2018-07-24 MED ORDER — LIDOCAINE HCL (PF) 1 % IJ SOLN
INTRAMUSCULAR | Status: AC
  Filled 2018-07-24: qty 30

## 2018-07-24 SURGICAL SUPPLY — 13 items
CATH INFINITI 5 FR AL2 (CATHETERS) ×1 IMPLANT
CATH INFINITI 5FR MULTPACK ANG (CATHETERS) ×1 IMPLANT
GUIDEWIRE INQWIRE 1.5J.035X260 (WIRE) IMPLANT
INQWIRE 1.5J .035X260CM (WIRE) ×2
KIT HEART LEFT (KITS) ×2 IMPLANT
PACK CARDIAC CATHETERIZATION (CUSTOM PROCEDURE TRAY) ×2 IMPLANT
SHEATH PINNACLE 5F 10CM (SHEATH) ×1 IMPLANT
SHEATH PROBE COVER 6X72 (BAG) ×1 IMPLANT
SYR MEDRAD MARK 7 150ML (SYRINGE) ×2 IMPLANT
TRANSDUCER W/STOPCOCK (MISCELLANEOUS) ×2 IMPLANT
TUBING CIL FLEX 10 FLL-RA (TUBING) ×2 IMPLANT
WIRE EMERALD 3MM-J .035X150CM (WIRE) ×1 IMPLANT
WIRE EMERALD ST .035X150CM (WIRE) ×1 IMPLANT

## 2018-07-24 NOTE — Progress Notes (Signed)
  Progress Note    07/24/2018 10:17 AM 2 Days Post-Op  Subjective: Still not feeling well,  Vitals:   07/24/18 0501 07/24/18 0805  BP: 94/60 107/68  Pulse: (!) 109 96  Resp: 20 18  Temp: 98.4 F (36.9 C) 98.2 F (36.8 C)  SpO2: 98% 100%    Physical Exam: Awake alert and oriented There is strong anterior tibial and posterior tibial signals on the left Left lower extremity has pitting edema throughout Toes with brisk capillary refill Incisions clean dry intact  CBC    Component Value Date/Time   WBC 10.8 (H) 07/24/2018 0314   RBC 2.48 (L) 07/24/2018 0314   HGB 8.0 (L) 07/24/2018 0314   HGB 9.5 (L) 07/14/2018 0909   HGB 10.8 (L) 03/14/2017 0921   HCT 24.0 (L) 07/24/2018 0314   HCT 29.3 (L) 07/14/2018 0909   HCT 32.5 (L) 03/14/2017 0921   PLT 130 (L) 07/24/2018 0314   PLT 115 (L) 07/14/2018 0909   MCV 96.8 07/24/2018 0314   MCV 100 (H) 07/14/2018 0909   MCV 106.2 (H) 03/14/2017 0921   MCH 32.3 07/24/2018 0314   MCHC 33.3 07/24/2018 0314   RDW 17.1 (H) 07/24/2018 0314   RDW 15.6 (H) 07/14/2018 0909   RDW 14.6 03/14/2017 0921   LYMPHSABS 1.0 07/23/2018 1849   LYMPHSABS 0.8 (L) 03/14/2017 0921   MONOABS 0.8 07/23/2018 1849   MONOABS 0.6 03/14/2017 0921   EOSABS 0.0 07/23/2018 1849   EOSABS 0.1 03/14/2017 0921   BASOSABS 0.0 07/23/2018 1849   BASOSABS 0.0 03/14/2017 0921    BMET    Component Value Date/Time   NA 137 07/24/2018 0314   NA 135 07/14/2018 0909   NA 141 03/14/2017 0921   K 5.4 (H) 07/24/2018 0314   K 4.1 03/14/2017 0921   CL 93 (L) 07/24/2018 0314   CO2 28 07/24/2018 0314   CO2 33 (H) 03/14/2017 0921   GLUCOSE 135 (H) 07/24/2018 0314   GLUCOSE 144 (H) 03/14/2017 0921   BUN 29 (H) 07/24/2018 0314   BUN 76 (HH) 07/14/2018 0909   BUN 30.1 (H) 03/14/2017 0921   CREATININE 6.24 (H) 07/24/2018 0314   CREATININE 9.16 (HH) 09/17/2017 0954   CREATININE 8.1 (HH) 03/14/2017 0921   CALCIUM 9.5 07/24/2018 0314   CALCIUM 9.5 03/14/2017 0921   GFRNONAA 9 (L) 07/24/2018 0314   GFRNONAA 5 (L) 09/17/2017 0954   GFRNONAA 24 (L) 06/30/2014 1528   GFRAA 10 (L) 07/24/2018 0314   GFRAA 6 (L) 09/17/2017 0954   GFRAA 28 (L) 06/30/2014 1528    INR    Component Value Date/Time   INR 1.2 07/22/2018 0623   INR 1.10 (L) 01/21/2015 0809     Intake/Output Summary (Last 24 hours) at 07/24/2018 1017 Last data filed at 07/24/2018 0415 Gross per 24 hour  Intake 361.21 ml  Output 139 ml  Net 222.21 ml     Assessment/plan:  68 y.o. male is s/p left common femoral to below-knee popliteal artery bypass with graft.  Troponin significantly elevated this morning patient still not feeling well.  Bypass graft patent by exam.  Appreciate cardiology management of patient    Eda Paschal. Donzetta Matters, MD [facility] Office: [phone] Pager: [phone]  07/24/2018 10:17 AM

## 2018-07-24 NOTE — Progress Notes (Signed)
ANTICOAGULATION CONSULT NOTE - Follow Up Consult  Pharmacy Consult for Heparin Indication: chest pain/ACS  No Known Allergies  Patient Measurements: Height: 5\' 10"  (177.8 cm) Weight: 195 lb 15.8 oz (88.9 kg) IBW/kg (Calculated) : 73 Heparin Dosing Weight: 88.9 kg  Vital Signs: Temp: 98.2 F (36.8 C) (05/14 0805) Temp Source: Oral (05/14 0805) BP: 107/68 (05/14 0805) Pulse Rate: 96 (05/14 0805)  Labs: Recent Labs    07/22/18 0623 07/23/18 0242 07/23/18 1239 07/23/18 1849 07/24/18 0314  HGB 9.7* 7.3*  --  7.4* 8.0*  HCT 29.8* 22.5*  --  21.5* 24.0*  PLT 134* 140*  --  122* 130*  APTT 45*  --   --   --   --   LABPROT 15.4*  --   --   --   --   INR 1.2  --   --   --   --   HEPARINUNFRC  --   --   --  0.35 0.58  CREATININE 7.72* 9.61*  --   --  6.24*  TROPONINI  --   --  0.09* 5.25*  --     Estimated Creatinine Clearance: 13.1 mL/min (A) (by C-G formula based on SCr of 6.24 mg/dL (H)).  Assessment:   Anticoag: Start IV heparin for CP. Left lower extremity critical limb ischemia; s/p left common femoral to below knee popliteal artery bypass graft 5/12. Hgb 9.7>7.3>8. Plts 130 low but stable. HL 0.58 remains in goal. Troponin elevated 0.09>5.25.  Goal of Therapy:  Heparin level 0.3-0.7 units/ml Monitor platelets by anticoagulation protocol: Yes   Plan:  Contine Heparin (no bolus per MD request) 1450 units/hr Daily HL and CBC  Lola Lofaro S. Alford Highland, PharmD, BCPS Clinical Staff Pharmacist Eilene Ghazi Stillinger 07/24/2018,9:09 AM

## 2018-07-24 NOTE — Interval H&P Note (Signed)
Cath Lab Visit (complete for each Cath Lab visit)  Clinical Evaluation Leading to the Procedure:   ACS: Yes.    Non-ACS:    Anginal Classification: CCS IV  Anti-ischemic medical therapy: Minimal Therapy (1 class of medications)  Non-Invasive Test Results: No non-invasive testing performed  Prior CABG: Previous CABG      History and Physical Interval Note:  07/24/2018 11:16 AM  Jose Garrett  has presented today for surgery, with the diagnosis of non stemi.  The various methods of treatment have been discussed with the patient and family. After consideration of risks, benefits and other options for treatment, the patient has consented to  Procedure(s): LEFT HEART CATH AND CORONARY ANGIOGRAPHY (N/A) as a surgical intervention.  The patient's history has been reviewed, patient examined, no change in status, stable for surgery.  I have reviewed the patient's chart and labs.  Questions were answered to the patient's satisfaction.     Sherren Mocha

## 2018-07-24 NOTE — Progress Notes (Signed)
ABI's have been completed. Preliminary results can be found in CV Proc through chart review.   07/24/18 4:09 PM Jose Garrett RVT

## 2018-07-24 NOTE — Progress Notes (Signed)
[facility] Progress Note  Name: Jose Garrett MRN: [id number] DOB: May 30, 1950  Chief Complaint:  Left lower extremity critical limb ischemia; planned surgery   Subjective:  Pt was hypotensive with HD yest- BPs have improved somewhat 107/61 this AM- cards saw pt- had elevated troponin- started on heparin - also throwing up - now plan is for cardiac cath today     Intake/Output Summary (Last 24 hours) at 07/24/2018 1028 Last data filed at 07/24/2018 0415 Gross per 24 hour  Intake 361.21 ml  Output 139 ml  Net 222.21 ml    Vitals:  Vitals:   07/24/18 0032 07/24/18 0429 07/24/18 0501 07/24/18 0805  BP: 90/69 94/60 94/60  107/68  Pulse: 99 99 (!) 109 96  Resp: (!) 22 (!) 21 20 18   Temp:  98.7 F (37.1 C) 98.4 F (36.9 C) 98.2 F (36.8 C)  TempSrc:  Oral Oral Oral  SpO2: 99% 97% 98% 100%  Weight:      Height:         Physical Exam:  General adult male in bed - resting but did arouse-  Feels bad HEENT normocephalic atraumatic extraocular movements intact sclera anicteric Neck supple trachea midline Lungs clear to auscultation bilaterally normal work of breathing at rest  Heart RRR- borderline tachy  Abdomen soft nontender nondistended Extremities no pitting edema  Psych normal mood and affect Access: RUE AVF- patent    Medications reviewed   Labs:  BMP Latest Ref Rng & Units 07/24/2018 07/23/2018 07/22/2018  Glucose 70 - 99 mg/dL 135(H) 126(H) 118(H)  BUN 8 - 23 mg/dL 29(H) 47(H) 30(H)  Creatinine 0.61 - 1.24 mg/dL 6.24(H) 9.61(H) 7.72(H)  BUN/Creat Ratio 10 - 24 - - -  Sodium 135 - 145 mmol/L 137 138 139  Potassium 3.5 - 5.1 mmol/L 5.4(H) 5.9(H) 4.0  Chloride 98 - 111 mmol/L 93(L) 94(L) 89(L)  CO2 22 - 32 mmol/L 28 29 25   Calcium 8.9 - 10.3 mg/dL 9.5 9.2 9.1     Assessment/Plan:   Pt with ESRD on HD MWF, ischemic cardiomyopathy, PAD presented to the hospital for left common femoral to below knee popliteal artery bypass graft for critical left  lower extremity ischemia  # ESRD - would be due for HD tomorrow per MWF schedule- will reassess in AM  - Would limit gabapentin to no more than 300 mg daily given ESRD.  Please avoid morphine for pain control   # Hyperkalemia - 2 K bath with HD  K of 5.4 today , will check in AM- give dose of veltassa tonight   # PAD with critical limb ischemia - left lower extremity  - s/p left common femoral to below knee popliteal artery bypass graft- no issues   # Ischemic cardiomyopathy  - Optimizing volume with HD - seems at EDW for now  # Anemia  - Secondary in part to ESRD as well as acute blood loss from procedure  - on aranesp and nulecit  - Transfuse 1 unit PRBC's - done on 5/13  # Secondary hyperparathyroidism, metabolic bone disease  - On velphoro   Elevated troponin- per cards for cardiac cath today   Louis Meckel, MD 07/24/2018 10:28 AM

## 2018-07-24 NOTE — Progress Notes (Signed)
Left lower extremity venous duplex completed. Results in chart review CV Proc. [place] Nyna Chilton,RVS  07/24/2018, 2:56 PM

## 2018-07-24 NOTE — Progress Notes (Signed)
PT Cancellation Note  Patient Details Name: Jose Garrett MRN: [id number] DOB: [birth year]   Cancelled Treatment:    Reason Eval/Treat Not Completed: Patient at procedure or test/unavailable Cardiac cath; will follow-up as time allows.    Lanney Gins, PT, DPT Supplemental Physical Therapist 07/24/18 12:06 PM Pager: [phone] Office: [phone]

## 2018-07-24 NOTE — H&P (View-Only) (Signed)
Progress Note  Patient Name: Jose Garrett Date of Encounter: 07/24/2018  Primary Cardiologist: Lauree Chandler, MD   Subjective   No chest pain.  Feels better. Had a lot of nausea last night.  Inpatient Medications    Scheduled Meds: . sodium chloride   Intravenous Once  . aspirin EC  81 mg Oral Daily  . atorvastatin  80 mg Oral Q supper  . clopidogrel  75 mg Oral Q supper  . darbepoetin (ARANESP) injection - DIALYSIS  60 mcg Intravenous Q Wed-HD  . docusate sodium  100 mg Oral Daily  . gabapentin  300 mg Oral q morning - 10a  . loratadine  10 mg Oral Daily  . pantoprazole  40 mg Oral Daily  . sucroferric oxyhydroxide  1,000 mg Oral TID WC  . sucroferric oxyhydroxide  500 mg Oral With snacks  . vitamin B-12  1,000 mcg Oral Daily   Continuous Infusions: . sodium chloride    . ferric gluconate (FERRLECIT/NULECIT) IV Stopped (07/23/18 1140)  . heparin 1,450 Units/hr (07/24/18 0415)  . magnesium sulfate bolus IVPB     PRN Meds: sodium chloride, acetaminophen **OR** acetaminophen, bisacodyl, guaiFENesin-dextromethorphan, hydrALAZINE, labetalol, magnesium sulfate bolus IVPB, metoprolol tartrate, ondansetron, oxyCODONE, oxyCODONE, phenol, polyethylene glycol   Vital Signs    Vitals:   07/24/18 0005 07/24/18 0032 07/24/18 0429 07/24/18 0501  BP: (!) 85/55 90/69 94/60  94/60  Pulse: 98 99 99 (!) 109  Resp: 20 (!) 22 (!) 21 20  Temp: 98.7 F (37.1 C)  98.7 F (37.1 C) 98.4 F (36.9 C)  TempSrc: Oral  Oral Oral  SpO2: 98% 99% 97% 98%  Weight:      Height:        Intake/Output Summary (Last 24 hours) at 07/24/2018 0757 Last data filed at 07/24/2018 0415 Gross per 24 hour  Intake 676.21 ml  Output 139 ml  Net 537.21 ml   Last 3 Weights 07/23/2018 07/23/2018 07/23/2018  Weight (lbs) 195 lb 15.8 oz 196 lb 13.9 oz 197 lb 5 oz  Weight (kg) 88.9 kg 89.3 kg 89.5 kg      Telemetry    NSR - Personally Reviewed  ECG      Physical Exam   GEN: No acute distress.    Neck: No JVD Cardiac: RRR, no murmurs, rubs, or gallops.  Respiratory: Clear to auscultation bilaterally. GI: Soft, nontender, non-distended  MS: No edema; No deformity. Neuro:  Nonfocal  Psych: Normal affect   Labs    Chemistry Recent Labs  Lab 07/22/18 0623 07/23/18 0242 07/24/18 0314  NA 139 138 137  K 4.0 5.9* 5.4*  CL 89* 94* 93*  CO2 25 29 28   GLUCOSE 118* 126* 135*  BUN 30* 47* 29*  CREATININE 7.72* 9.61* 6.24*  CALCIUM 9.1 9.2 9.5  PROT 6.8  --   --   ALBUMIN 3.4*  --  3.6  AST 16  --   --   ALT 14  --   --   ALKPHOS 55  --   --   BILITOT 0.7  --   --   GFRNONAA 7* 5* 9*  GFRAA 8* 6* 10*  ANIONGAP 25* 15 16*     Hematology Recent Labs  Lab 07/23/18 0242 07/23/18 1849 07/24/18 0314  WBC 8.5 9.6 10.8*  RBC 2.24* 2.28* 2.48*  HGB 7.3* 7.4* 8.0*  HCT 22.5* 21.5* 24.0*  MCV 100.4* 94.3 96.8  MCH 32.6 32.5 32.3  MCHC 32.4 34.4 33.3  RDW 14.5  17.1* 17.1*  PLT 140* 122* 130*    Cardiac Enzymes Recent Labs  Lab 07/23/18 1239 07/23/18 1849  TROPONINI 0.09* 5.25*   No results for input(s): TROPIPOC in the last 168 hours.   BNPNo results for input(s): BNP, PROBNP in the last 168 hours.   DDimer No results for input(s): DDIMER in the last 168 hours.   Radiology    Dg Chest Port 1 View  Result Date: 07/23/2018 CLINICAL DATA:  68 year old male with a history of shortness of breath EXAM: PORTABLE CHEST 1 VIEW COMPARISON:  03/23/2018 FINDINGS: Cardiomediastinal silhouette unchanged in size and contour. Mild interlobular septal thickening. No confluent airspace disease. No pleural effusion or pneumothorax. Coarsened interstitial markings. IMPRESSION: Pattern of mild interlobular septal thickening, potentially chronic from prior episodes of CHF versus mild acute edema. No evidence of lobar pneumonia. Electronically Signed   By: Corrie Mckusick D.O.   On: 07/23/2018 12:45    Cardiac Studies   N/a   Patient Profile     68 y.o. male with a hx of ESRD on  HD, AL amyloid, aortic stenosis, HTN, GERD, anemia, combined HF and CAD who was seen for elevated troponin.   Assessment & Plan    1. Elevated Troponin: Suspect demand ischemia in the setting of acute anemia and ESRD on HD. He has known CAD with occluded RCA filling via collaterals from cath back in January. Last troponin up to 5.25. Will check additional troponin. Again suspect this is 2/2 to underlying CAD with recent surgery, hypotension and anemia, though will need relook cath once stable from VVS standpoint.  -- remains on ASA, statin, plavix and IV heparin therapy.  2. PVD: s/p left common femoral popliteal artery bypass graft via Dr. Donzetta Matters.   3. Anemia: Hgb 7.3>>8.0. s/p 1 unit PRBCs. With hx of CAD ideally would like to keep Hgb >8.   4. ESRD on HD: nephrology following. Bp dropped after HD yesterday. Appears more stable overnight. Would hold on adding BB therapy this morning.    For questions or updates, please contact [place] Please consult [email or web address] for contact info under     Signed, Reino Bellis, NP  07/24/2018, 7:57 AM     I have examined the patient and reviewed assessment and plan and discussed with patient.  Agree with above as stated.  Troponin has increased significnatly.  WIll plan for repeat cath.  Patient is agreeable.    Cardiac catheterization was discussed with the patient fully. The patient understands that risks include but are not limited to stroke (1 in 1000), death (1 in 71), kidney failure [usually temporary] (1 in 500), bleeding (1 in 200), allergic reaction [possibly serious] (1 in 200).  The patient understands and is willing to proceed.    Will discuss with vascular surgery about giving him another unit of blood given ischemia and need for cath.  Vascular surgery was fine with cath and with transfusion if needed.  THey did not that he has some right femoral disease, but angio was performed from that approach recently.     Larae Grooms

## 2018-07-24 NOTE — Progress Notes (Addendum)
Progress Note  Patient Name: Jose Garrett Date of Encounter: 07/24/2018  Primary Cardiologist: Lauree Chandler, MD   Subjective   No chest pain.  Feels better. Had a lot of nausea last night.  Inpatient Medications    Scheduled Meds: . sodium chloride   Intravenous Once  . aspirin EC  81 mg Oral Daily  . atorvastatin  80 mg Oral Q supper  . clopidogrel  75 mg Oral Q supper  . darbepoetin (ARANESP) injection - DIALYSIS  60 mcg Intravenous Q Wed-HD  . docusate sodium  100 mg Oral Daily  . gabapentin  300 mg Oral q morning - 10a  . loratadine  10 mg Oral Daily  . pantoprazole  40 mg Oral Daily  . sucroferric oxyhydroxide  1,000 mg Oral TID WC  . sucroferric oxyhydroxide  500 mg Oral With snacks  . vitamin B-12  1,000 mcg Oral Daily   Continuous Infusions: . sodium chloride    . ferric gluconate (FERRLECIT/NULECIT) IV Stopped (07/23/18 1140)  . heparin 1,450 Units/hr (07/24/18 0415)  . magnesium sulfate bolus IVPB     PRN Meds: sodium chloride, acetaminophen **OR** acetaminophen, bisacodyl, guaiFENesin-dextromethorphan, hydrALAZINE, labetalol, magnesium sulfate bolus IVPB, metoprolol tartrate, ondansetron, oxyCODONE, oxyCODONE, phenol, polyethylene glycol   Vital Signs    Vitals:   07/24/18 0005 07/24/18 0032 07/24/18 0429 07/24/18 0501  BP: (!) 85/55 90/69 94/60  94/60  Pulse: 98 99 99 (!) 109  Resp: 20 (!) 22 (!) 21 20  Temp: 98.7 F (37.1 C)  98.7 F (37.1 C) 98.4 F (36.9 C)  TempSrc: Oral  Oral Oral  SpO2: 98% 99% 97% 98%  Weight:      Height:        Intake/Output Summary (Last 24 hours) at 07/24/2018 0757 Last data filed at 07/24/2018 0415 Gross per 24 hour  Intake 676.21 ml  Output 139 ml  Net 537.21 ml   Last 3 Weights 07/23/2018 07/23/2018 07/23/2018  Weight (lbs) 195 lb 15.8 oz 196 lb 13.9 oz 197 lb 5 oz  Weight (kg) 88.9 kg 89.3 kg 89.5 kg      Telemetry    NSR - Personally Reviewed  ECG      Physical Exam   GEN: No acute distress.    Neck: No JVD Cardiac: RRR, 3/6 systolic murmur, rubs, or gallops.  Respiratory: Clear to auscultation bilaterally. GI: Soft, nontender, non-distended  MS: No edema; No deformity. Neuro:  Nonfocal  Psych: Normal affect   Labs    Chemistry Recent Labs  Lab 07/22/18 0623 07/23/18 0242 07/24/18 0314  NA 139 138 137  K 4.0 5.9* 5.4*  CL 89* 94* 93*  CO2 25 29 28   GLUCOSE 118* 126* 135*  BUN 30* 47* 29*  CREATININE 7.72* 9.61* 6.24*  CALCIUM 9.1 9.2 9.5  PROT 6.8  --   --   ALBUMIN 3.4*  --  3.6  AST 16  --   --   ALT 14  --   --   ALKPHOS 55  --   --   BILITOT 0.7  --   --   GFRNONAA 7* 5* 9*  GFRAA 8* 6* 10*  ANIONGAP 25* 15 16*     Hematology Recent Labs  Lab 07/23/18 0242 07/23/18 1849 07/24/18 0314  WBC 8.5 9.6 10.8*  RBC 2.24* 2.28* 2.48*  HGB 7.3* 7.4* 8.0*  HCT 22.5* 21.5* 24.0*  MCV 100.4* 94.3 96.8  MCH 32.6 32.5 32.3  MCHC 32.4 34.4 33.3  RDW  14.5 17.1* 17.1*  PLT 140* 122* 130*    Cardiac Enzymes Recent Labs  Lab 07/23/18 1239 07/23/18 1849  TROPONINI 0.09* 5.25*   No results for input(s): TROPIPOC in the last 168 hours.   BNPNo results for input(s): BNP, PROBNP in the last 168 hours.   DDimer No results for input(s): DDIMER in the last 168 hours.   Radiology    Dg Chest Port 1 View  Result Date: 07/23/2018 CLINICAL DATA:  68 year old male with a history of shortness of breath EXAM: PORTABLE CHEST 1 VIEW COMPARISON:  03/23/2018 FINDINGS: Cardiomediastinal silhouette unchanged in size and contour. Mild interlobular septal thickening. No confluent airspace disease. No pleural effusion or pneumothorax. Coarsened interstitial markings. IMPRESSION: Pattern of mild interlobular septal thickening, potentially chronic from prior episodes of CHF versus mild acute edema. No evidence of lobar pneumonia. Electronically Signed   By: Corrie Mckusick D.O.   On: 07/23/2018 12:45    Cardiac Studies   N/a   Patient Profile     68 y.o. male with a hx  of ESRD on HD, AL amyloid, aortic stenosis, HTN, GERD, anemia, combined HF and CAD who was seen for elevated troponin.   Assessment & Plan    1. Elevated Troponin: Suspect demand ischemia in the setting of acute anemia and ESRD on HD. He has known CAD with occluded RCA filling via collaterals from cath back in January. Last troponin up to 5.25. Will check additional troponin. Again suspect this is 2/2 to underlying CAD with recent surgery, hypotension and anemia, though will need relook cath once stable from VVS standpoint.  -- remains on ASA, statin, plavix and IV heparin therapy.  2. PVD: s/p left common femoral popliteal artery bypass graft via Dr. Donzetta Matters.   3. Anemia: Hgb 7.3>>8.0. s/p 1 unit PRBCs. With hx of CAD ideally would like to keep Hgb >8.   4. ESRD on HD: nephrology following. Bp dropped after HD yesterday. Appears more stable overnight. Would hold on adding BB therapy this morning.    For questions or updates, please contact [place] Please consult [email or web address] for contact info under     Signed, Reino Bellis, NP  07/24/2018, 7:57 AM     I have examined the patient and reviewed assessment and plan and discussed with patient.  Agree with above as stated.  Troponin has increased significnatly.  WIll plan for repeat cath.  Patient is agreeable.    Cardiac catheterization was discussed with the patient fully. The patient understands that risks include but are not limited to stroke (1 in 1000), death (1 in 73), kidney failure [usually temporary] (1 in 500), bleeding (1 in 200), allergic reaction [possibly serious] (1 in 200).  The patient understands and is willing to proceed.    Will discuss with vascular surgery about giving him another unit of blood given ischemia and need for cath.  Vascular surgery was fine with cath and with transfusion if needed.  THey did not that he has some right femoral disease, but angio was performed from that approach recently.      Larae Grooms

## 2018-07-24 NOTE — Progress Notes (Signed)
      5 Fr sheath was pulled manualy from the R F/A, and pressure was held for 20 min.Sterila gauze was applied at the site. R groin is soft and non tender.     Bed rest started at 1245 X 4 hr. Instructions were given to patient about his movement limitations during this time .R PT obtained by doppler before and after sheath pull.       HR 96 SR    BP 89/52    sPO2 97% on R/A

## 2018-07-24 NOTE — Progress Notes (Signed)
OT Cancellation Note  Patient Details Name: Jose Garrett MRN: [id number] DOB: Sep 01, 1950   Cancelled Treatment:    Reason Eval/Treat Not Completed: Patient at procedure or test/ unavailable(cardiac cath )  [place] 07/24/2018, 11:05 AM   Hulda Humphrey OTR/L Acute Rehabilitation Services Pager: [phone] Office: [phone]

## 2018-07-25 ENCOUNTER — Inpatient Hospital Stay (HOSPITAL_COMMUNITY): Payer: Medicare Other

## 2018-07-25 ENCOUNTER — Telehealth: Payer: Self-pay | Admitting: Vascular Surgery

## 2018-07-25 DIAGNOSIS — R7989 Other specified abnormal findings of blood chemistry: Secondary | ICD-10-CM

## 2018-07-25 DIAGNOSIS — I350 Nonrheumatic aortic (valve) stenosis: Secondary | ICD-10-CM

## 2018-07-25 DIAGNOSIS — I248 Other forms of acute ischemic heart disease: Secondary | ICD-10-CM

## 2018-07-25 LAB — CBC
HCT: 21.9 % — ABNORMAL LOW (ref 39.0–52.0)
Hemoglobin: 7.3 g/dL — ABNORMAL LOW (ref 13.0–17.0)
MCH: 32.4 pg (ref 26.0–34.0)
MCHC: 33.3 g/dL (ref 30.0–36.0)
MCV: 97.3 fL (ref 80.0–100.0)
Platelets: 124 10*3/uL — ABNORMAL LOW (ref 150–400)
RBC: 2.25 MIL/uL — ABNORMAL LOW (ref 4.22–5.81)
RDW: 16.1 % — ABNORMAL HIGH (ref 11.5–15.5)
WBC: 9 10*3/uL (ref 4.0–10.5)
nRBC: 0.2 % (ref 0.0–0.2)

## 2018-07-25 LAB — RENAL FUNCTION PANEL
Albumin: 3.2 g/dL — ABNORMAL LOW (ref 3.5–5.0)
Anion gap: 17 — ABNORMAL HIGH (ref 5–15)
BUN: 59 mg/dL — ABNORMAL HIGH (ref 8–23)
CO2: 25 mmol/L (ref 22–32)
Calcium: 9.2 mg/dL (ref 8.9–10.3)
Chloride: 92 mmol/L — ABNORMAL LOW (ref 98–111)
Creatinine, Ser: 9.08 mg/dL — ABNORMAL HIGH (ref 0.61–1.24)
GFR calc Af Amer: 6 mL/min — ABNORMAL LOW (ref >60)
GFR calc non Af Amer: 5 mL/min — ABNORMAL LOW (ref >60)
Glucose, Bld: 113 mg/dL — ABNORMAL HIGH (ref 70–99)
Phosphorus: 8.6 mg/dL — ABNORMAL HIGH (ref 2.5–4.6)
Potassium: 4.9 mmol/L (ref 3.5–5.1)
Sodium: 134 mmol/L — ABNORMAL LOW (ref 135–145)

## 2018-07-25 LAB — ECHOCARDIOGRAM COMPLETE
Height: 70 in
Weight: 3135.82 oz

## 2018-07-25 MED ORDER — ENSURE PO LIQD: 237.0000 mL | Freq: Three times a day (TID) | ORAL | Status: DC

## 2018-07-25 MED ORDER — CINACALCET HCL 30 MG PO TABS
60.0000 mg | ORAL_TABLET | ORAL | Status: DC
  Administered 2018-07-28: 12:00:00 60 mg via ORAL
  Filled 2018-07-25: qty 2

## 2018-07-25 MED ORDER — ENSURE MAX PROTEIN PO LIQD
11.0000 [oz_av] | Freq: Three times a day (TID) | ORAL | Status: DC
  Administered 2018-07-25 – 2018-07-28 (×6): 11 [oz_av] via ORAL
  Filled 2018-07-25 (×11): qty 330

## 2018-07-25 MED ORDER — CALCITRIOL 0.25 MCG PO CAPS
1.2500 ug | ORAL_CAPSULE | ORAL | Status: DC
  Administered 2018-07-28: 12:00:00 1.25 ug via ORAL
  Filled 2018-07-25: qty 5

## 2018-07-25 MED FILL — Heparin Sod (Porcine)-NaCl IV Soln 1000 Unit/500ML-0.9%: INTRAVENOUS | Qty: 1000 | Status: AC

## 2018-07-25 MED FILL — Verapamil HCl IV Soln 2.5 MG/ML: INTRAVENOUS | Qty: 2 | Status: AC

## 2018-07-25 NOTE — Evaluation (Signed)
Occupational Therapy Evaluation Patient Details Name: Jose Garrett MRN: [id number] DOB: Jun 26, 1950 Today's Date: 07/25/2018    History of Present Illness Admitted with abnormal vascular studies, now s/p Left common femoral to below-knee popliteal artery bypass graft; extensive medical problems including ESRD on HD, AL amyloid (kidney involvement), aortic stenosis, HTN, HLD, GERD, anemia, chronic combined CHF and CAD    Clinical Impression   Pt ambulated with a cane and was independent in self care. Pt presents with reported 9/10 pain in L LE, generalized weakness and impaired standing balance. He requires set up to total assist for ADL and moderate assistance to stand. Pt is very heavily reliant on RW in standing. Pt is fearful of taking pain meds, as he vomited last night and attributes to pain meds. RN notified. Recommending CIR for intensive rehab. Will follow acutely.    Follow Up Recommendations  CIR    Equipment Recommendations  3 in 1 bedside commode    Recommendations for Other Services       Precautions / Restrictions Precautions Precautions: Fall Restrictions Weight Bearing Restrictions: No      Mobility Bed Mobility Overal bed mobility: Needs Assistance Bed Mobility: Sit to Supine       Sit to supine: Mod assist   General bed mobility comments: assist for LEs back into bed  Transfers Overall transfer level: Needs assistance Equipment used: Rolling walker (2 wheeled) Transfers: Sit to/from Stand Sit to Stand: Mod assist         General transfer comment: Mod assist to rise; most of weight on RLE; cues for hand placement and safety; painful    Balance Overall balance assessment: Needs assistance   Sitting balance-Leahy Scale: Fair       Standing balance-Leahy Scale: Poor Standing balance comment: heavily dependent on RW                           ADL either performed or assessed with clinical judgement   ADL Overall ADL's : Needs  assistance/impaired Eating/Feeding: Independent;Sitting   Grooming: Oral care;Sitting;Set up   Upper Body Bathing: Minimal assistance;Sitting   Lower Body Bathing: Total assistance;Sit to/from stand   Upper Body Dressing : Minimal assistance;Sitting   Lower Body Dressing: Total assistance;Sit to/from stand                       Vision Baseline Vision/History: Wears glasses Wears Glasses: At all times Patient Visual Report: No change from baseline       Perception     Praxis      Pertinent Vitals/Pain Pain Assessment: 0-10 Pain Score: 9  Pain Location: L LE Pain Descriptors / Indicators: Aching;Grimacing;Guarding;Moaning Pain Intervention(s): Monitored during session;Repositioned;Ice applied;Patient requesting pain meds-RN notified     Hand Dominance Right   Extremity/Trunk Assessment Upper Extremity Assessment Upper Extremity Assessment: Generalized weakness(HD fistula in R UE)   Lower Extremity Assessment Lower Extremity Assessment: Defer to PT evaluation       Communication Communication Communication: No difficulties   Cognition Arousal/Alertness: Awake/alert Behavior During Therapy: WFL for tasks assessed/performed Overall Cognitive Status: Within Functional Limits for tasks assessed                                     General Comments       Exercises     Shoulder Instructions  Home Living Family/patient expects to be discharged to:: Private residence Living Arrangements: Alone Available Help at Discharge: Family;Available PRN/intermittently Type of Home: Apartment Home Access: Stairs to enter Entrance Stairs-Number of Steps: 3 Entrance Stairs-Rails: Right Home Layout: One level     Bathroom Shower/Tub: Teacher, early years/pre: Standard     Home Equipment: Cane - single point   Additional Comments: may go to his daughter's home      Prior Functioning/Environment Level of Independence:  Independent with assistive device(s)        Comments: Consistently uses cane to walk        OT Problem List: Decreased strength;Decreased activity tolerance;Impaired balance (sitting and/or standing);Decreased knowledge of use of DME or AE;Pain      OT Treatment/Interventions: Self-care/ADL training;DME and/or AE instruction;Therapeutic activities;Patient/family education;Balance training    OT Goals(Current goals can be found in the care plan section) Acute Rehab OT Goals Patient Stated Goal: less pain in LLE OT Goal Formulation: With patient Time For Goal Achievement: 08/08/18 Potential to Achieve Goals: Good ADL Goals Pt Will Perform Grooming: with supervision;standing Pt Will Perform Lower Body Bathing: with supervision;with adaptive equipment;sit to/from stand Pt Will Perform Lower Body Dressing: with supervision;with adaptive equipment;sit to/from stand Pt Will Transfer to Toilet: with supervision;ambulating;bedside commode Pt Will Perform Toileting - Clothing Manipulation and hygiene: with supervision;sit to/from stand  OT Frequency: Min 2X/week   Barriers to D/C: Decreased caregiver support          Co-evaluation              AM-PAC OT "6 Clicks" Daily Activity     Outcome Measure Help from another person eating meals?: None Help from another person taking care of personal grooming?: A Little Help from another person toileting, which includes using toliet, bedpan, or urinal?: Total Help from another person bathing (including washing, rinsing, drying)?: A Lot Help from another person to put on and taking off regular upper body clothing?: A Little Help from another person to put on and taking off regular lower body clothing?: Total 6 Click Score: 14   End of Session Equipment Utilized During Treatment: Gait belt;Rolling walker Nurse Communication: Patient requests pain meds  Activity Tolerance: Patient limited by pain Patient left: in bed;with call  bell/phone within reach  OT Visit Diagnosis: Unsteadiness on feet (R26.81);Other abnormalities of gait and mobility (R26.89);Pain;Muscle weakness (generalized) (M62.81)                Time: 5830-9407 OT Time Calculation (min): 28 min Charges:  OT General Charges $OT Visit: 1 Visit OT Evaluation $OT Eval Moderate Complexity: 1 Mod OT Treatments $Self Care/Home Management : 8-22 mins  Nestor Lewandowsky, OTR/L Acute Rehabilitation Services Pager: [phone] Office: [phone]  Jose Garrett 07/25/2018, 2:39 PM

## 2018-07-25 NOTE — Progress Notes (Signed)
Progress Note  Patient Name: Jose Garrett Date of Encounter: 07/25/2018  Primary Cardiologist: Lauree Chandler, MD   Subjective   No further chest pain.  No issues at cath site.  Inpatient Medications    Scheduled Meds:  sodium chloride   Intravenous Once   aspirin EC  81 mg Oral Daily   atorvastatin  80 mg Oral Q supper   clopidogrel  75 mg Oral Q supper   darbepoetin (ARANESP) injection - DIALYSIS  60 mcg Intravenous Q Wed-HD   docusate sodium  100 mg Oral Daily   gabapentin  300 mg Oral q morning - 10a   heparin  5,000 Units Subcutaneous Q8H   loratadine  10 mg Oral Daily   pantoprazole  40 mg Oral Daily   sodium chloride flush  3 mL Intravenous Q12H   sodium chloride flush  3 mL Intravenous Q12H   sucroferric oxyhydroxide  1,000 mg Oral TID WC   sucroferric oxyhydroxide  500 mg Oral With snacks   vitamin B-12  1,000 mcg Oral Daily   Continuous Infusions:  sodium chloride     sodium chloride     ferric gluconate (FERRLECIT/NULECIT) IV Stopped (07/23/18 1140)   magnesium sulfate bolus IVPB     PRN Meds: sodium chloride, sodium chloride, acetaminophen **OR** acetaminophen, bisacodyl, guaiFENesin-dextromethorphan, magnesium sulfate bolus IVPB, metoprolol tartrate, ondansetron, oxyCODONE, oxyCODONE, phenol, polyethylene glycol, sodium chloride flush   Vital Signs    Vitals:   07/24/18 1806 07/24/18 2023 07/24/18 2352 07/25/18 0432  BP: 114/78 (!) 101/57 99/81 96/68   Pulse: 98 99  91  Resp: 18 (!) 22 20 20   Temp: 98.2 F (36.8 C) 98.9 F (37.2 C) 98.8 F (37.1 C) 98.5 F (36.9 C)  TempSrc: Oral Oral Oral Oral  SpO2: 93% 96% 95% 95%  Weight:      Height:        Intake/Output Summary (Last 24 hours) at 07/25/2018 0801 Last data filed at 07/24/2018 1800 Gross per 24 hour  Intake 0 ml  Output --  Net 0 ml   Last 3 Weights 07/23/2018 07/23/2018 07/23/2018  Weight (lbs) 195 lb 15.8 oz 196 lb 13.9 oz 197 lb 5 oz  Weight (kg) 88.9 kg  89.3 kg 89.5 kg      Telemetry    Sinus tach - Personally Reviewed  ECG    N/A  Physical Exam    GEN: No acute distress.   Neck: No JVD Cardiac: RRR, no murmurs, rubs, or gallops.  Respiratory: Clear to auscultation bilaterally. GI: Soft, nontender, non-distended  MS: No edema; No deformity. Neuro:  Nonfocal  Psych: Normal affect   Labs    Chemistry Recent Labs  Lab 07/22/18 0623 07/23/18 0242 07/24/18 0314 07/25/18 0520  NA 139 138 137 134*  K 4.0 5.9* 5.4* 4.9  CL 89* 94* 93* 92*  CO2 25 29 28 25   GLUCOSE 118* 126* 135* 113*  BUN 30* 47* 29* 59*  CREATININE 7.72* 9.61* 6.24* 9.08*  CALCIUM 9.1 9.2 9.5 9.2  PROT 6.8  --   --   --   ALBUMIN 3.4*  --  3.6 3.2*  AST 16  --   --   --   ALT 14  --   --   --   ALKPHOS 55  --   --   --   BILITOT 0.7  --   --   --   GFRNONAA 7* 5* 9* 5*  GFRAA 8* 6* 10* 6*  ANIONGAP 25* 15 16* 17*     Hematology Recent Labs  Lab 07/23/18 1849 07/24/18 0314 07/25/18 0520  WBC 9.6 10.8* 9.0  RBC 2.28* 2.48* 2.25*  HGB 7.4* 8.0* 7.3*  HCT 21.5* 24.0* 21.9*  MCV 94.3 96.8 97.3  MCH 32.5 32.3 32.4  MCHC 34.4 33.3 33.3  RDW 17.1* 17.1* 16.1*  PLT 122* 130* 124*    Cardiac Enzymes Recent Labs  Lab 07/23/18 1239 07/23/18 1849 07/24/18 0816  TROPONINI 0.09* 5.25* >65.00*     Radiology    Dg Chest Port 1 View  Result Date: 07/23/2018 CLINICAL DATA:  68 year old male with a history of shortness of breath EXAM: PORTABLE CHEST 1 VIEW COMPARISON:  03/23/2018 FINDINGS: Cardiomediastinal silhouette unchanged in size and contour. Mild interlobular septal thickening. No confluent airspace disease. No pleural effusion or pneumothorax. Coarsened interstitial markings. IMPRESSION: Pattern of mild interlobular septal thickening, potentially chronic from prior episodes of CHF versus mild acute edema. No evidence of lobar pneumonia. Electronically Signed   By: Corrie Mckusick D.O.   On: 07/23/2018 12:45   Vas [place] Burnard Bunting With/wo  Tbi  Result Date: 07/24/2018 LOWER EXTREMITY DOPPLER STUDY Indications: Peripheral artery disease. High Risk Factors: Hypertension, coronary artery disease. Other Factors: Right AVF                PVD.  Vascular Interventions: 07/22/2018 - Left Femoral to below the knee popliteal                         bypass graft. Limitations: Patient movement Comparison Study: No previous ABI's performed. Performing Technologist: Carlos Levering Rvt  Examination Guidelines: A complete evaluation includes at minimum, Doppler waveform signals and systolic blood pressure reading at the level of bilateral brachial, anterior tibial, and posterior tibial arteries, when vessel segments are accessible. Bilateral testing is considered an integral part of a complete examination. Photoelectric Plethysmograph (PPG) waveforms and toe systolic pressure readings are included as required and additional duplex testing as needed. Limited examinations for reoccurring indications may be performed as noted.  ABI Findings: +--------+------------------+-----+----------+--------+  Right    Rt Pressure (mmHg) Index Waveform   Comment   +--------+------------------+-----+----------+--------+  Brachial                                     AVF       +--------+------------------+-----+----------+--------+  PTA      114                1.00  monophasic           +--------+------------------+-----+----------+--------+  DP       254                2.23  biphasic             +--------+------------------+-----+----------+--------+ +---------+------------------+-----+---------+-------+  Left      Lt Pressure (mmHg) Index Waveform  Comment  +---------+------------------+-----+---------+-------+  Brachial  114                      triphasic          +---------+------------------+-----+---------+-------+  PTA       254                2.23  biphasic           +---------+------------------+-----+---------+-------+  DP        252  2.21  biphasic            +---------+------------------+-----+---------+-------+  Great Toe 50                 0.44                     +---------+------------------+-----+---------+-------+ +-------+-----------+-----------+------------+------------+  ABI/TBI Today's ABI Today's TBI Previous ABI Previous TBI  +-------+-----------+-----------+------------+------------+  Right   1.00                                               +-------+-----------+-----------+------------+------------+  Left    2.21        0.44                                   +-------+-----------+-----------+------------+------------+  Summary: Right: Resting right ankle-brachial index indicates noncompressible right lower extremity arteries. Unable to obtain TBI due to patient movement and low amplitude waveforms. Left: Resting left ankle-brachial index indicates noncompressible left lower extremity arteries.The left toe-brachial index is abnormal.  *See table(s) above for measurements and observations.  Electronically signed by Monica Martinez MD on 07/24/2018 at 4:26:28 PM.    Final    Vas [place] Lower Extremity Venous (dvt)  Result Date: 07/24/2018  Lower Venous Study Indications: Pain, and Swelling. left lower extremity  Performing Technologist: Toma Copier RVS  Examination Guidelines: A complete evaluation includes B-mode imaging, spectral Doppler, color Doppler, and power Doppler as needed of all accessible portions of each vessel. Bilateral testing is considered an integral part of a complete examination. Limited examinations for reoccurring indications may be performed as noted.  +-----+---------------+---------+-----------+----------+-------+  RIGHT Compressibility Phasicity Spontaneity Properties Summary  +-----+---------------+---------+-----------+----------+-------+  CFV   Full            Yes       Yes                             +-----+---------------+---------+-----------+----------+-------+  SFJ   Full                                                       +-----+---------------+---------+-----------+----------+-------+   +---------+---------------+---------+-----------+----------+-------+  LEFT      Compressibility Phasicity Spontaneity Properties Summary  +---------+---------------+---------+-----------+----------+-------+  CFV       Full            Yes       Yes                             +---------+---------------+---------+-----------+----------+-------+  SFJ       Full                                                      +---------+---------------+---------+-----------+----------+-------+  FV Prox   Full            Yes       Yes                             +---------+---------------+---------+-----------+----------+-------+  FV Mid    Full                                                      +---------+---------------+---------+-----------+----------+-------+  FV Distal Full            Yes       Yes                             +---------+---------------+---------+-----------+----------+-------+  PFV       Full            Yes       Yes                             +---------+---------------+---------+-----------+----------+-------+  POP       Full            Yes       Yes                             +---------+---------------+---------+-----------+----------+-------+  PTV       Full                                                      +---------+---------------+---------+-----------+----------+-------+  PERO      Full                                                      +---------+---------------+---------+-----------+----------+-------+     Summary: Right: There is no evidence of a common femoral vein obstruction Left: There is no evidence of deep vein thrombosis in the lower extremity. No cystic structure found in the popliteal fossa.  *See table(s) above for measurements and observations. Electronically signed by Monica Martinez MD on 07/24/2018 at 4:31:37 PM.    Final     Cardiac Studies   LEFT HEART CATH AND CORONARY ANGIOGRAPHY  Conclusion    1. Severe single vessel CAD with chronic total occlusion of the RCA, collateralized with L--->R collaterals 2. Mild calcific nonobstructive LCA disease, anatomy unchanged from previous cath studies 3. At least moderate AS, possibly severe LFLG - will check echo  Recommend: medical therapy, no target for PCI   Diagnostic  Dominance: Right    Pending echo   Patient Profile     68 y.o. male  with a hx of ESRD on HD, AL amyloid, aortic stenosis, HTN, GERD, anemia, combined HF and CADwho was seen for elevated troponin.   Assessment & Plan    1. Elevated Troponin with known CAD (RCA filled via collaterals) - Suspect demand ischemia in the setting of acute anemia and ESRD on HD. However troponin peaked >65. Diagnostic cath yesterday showed chronic total occlusion for RCA with L to R collaterals. Otherwise mild non obstructive CAD which stable compared to prior cath. Recommended medical therapy.  - Now off heparin but remains on ASA and Plavix. Continue  statin.   2. PVD:s/p left common femoral popliteal artery bypass graft via Dr. Donzetta Matters.   3. Anemia: s/p 1 unit PRBCs early on admission. Hgb 7.3 today from 8.0 post cath. Likely needs another transfusion if worsen.   4. ESRD on PI:[id number] following. BB on hold due to soft blood pressure.   5.Moderate aortic stenosis: Pending echo       For questions or updates, please contact [facility] Please consult [email or web address] for contact info under        Signed, Leanor Kail, PA  07/25/2018, 8:01 AM    I have examined the patient and reviewed assessment and plan and discussed with patient.  Agree with above as stated.  Right groin site without hematoma.  Gangrenous teos on left foot.  No angina.  Medical therapy.  Significant demand ischemia.  Likely severe AS as well.  Check echo.  Dr. Burt Knack aware.  May need to consider TAVR at some point down the road.     Agree that transfusion may be beneficial when possible, to  reduce further demand ischemia.   Larae Grooms

## 2018-07-25 NOTE — Procedures (Signed)
Echo attempted. Patient eating lunch. Will attempt again.

## 2018-07-25 NOTE — Progress Notes (Addendum)
[facility] Progress Note   Subjective:  Seen in room. Feels unwell. No specific complaint, just doesn't feel good. Sluggish responses. Doesn't want to go to HD today. Didn't eat breakfast. Denies CP, SOB.  BP lowish - cath yest did have disease- nothing amenable to PCI  Objective Vitals:   07/24/18 2023 07/24/18 2352 07/25/18 0432 07/25/18 0813  BP: (!) 101/57 99/81 96/68  96/63  Pulse: 99  91 91  Resp: (!) 22 20 20 18   Temp: 98.9 F (37.2 C) 98.8 F (37.1 C) 98.5 F (36.9 C) 98.6 F (37 C)  TempSrc: Oral Oral Oral Oral  SpO2: 96% 95% 95% 96%  Weight:      Height:        Physical Exam General: WNWD elderly male NAD  Heart: RRR Lungs: CTAB Abdomen: soft NTND  Extremities: Left LE incision clean +pitting edema. No edema RLE  Dialysis Access: R forearm AVF +bruit   Weight change:    Additional Objective Labs: Basic Metabolic Panel: Recent Labs  Lab 07/23/18 0242 07/24/18 0314 07/25/18 0520  NA 138 137 134*  K 5.9* 5.4* 4.9  CL 94* 93* 92*  CO2 29 28 25   GLUCOSE 126* 135* 113*  BUN 47* 29* 59*  CREATININE 9.61* 6.24* 9.08*  CALCIUM 9.2 9.5 9.2  PHOS  --  7.1* 8.6*   CBC: Recent Labs  Lab 07/22/18 0623 07/23/18 0242 07/23/18 1849 07/24/18 0314 07/25/18 0520  WBC 5.5 8.5 9.6 10.8* 9.0  NEUTROABS  --   --  7.7  --   --   HGB 9.7* 7.3* 7.4* 8.0* 7.3*  HCT 29.8* 22.5* 21.5* 24.0* 21.9*  MCV 102.1* 100.4* 94.3 96.8 97.3  PLT 134* 140* 122* 130* 124*   Blood Culture    Component Value Date/Time   SDES URINE, RANDOM 02/01/2016 1707   SPECREQUEST NONE 02/01/2016 1707   CULT (A) 02/01/2016 1707    10,000 COLONIES/mL PSEUDOMONAS AERUGINOSA 40,000 COLONIES/mL ENTEROBACTER AEROGENES    REPTSTATUS 02/04/2016 FINAL 02/01/2016 1707     Medications: . sodium chloride    . sodium chloride    . ferric gluconate (FERRLECIT/NULECIT) IV Stopped (07/23/18 1140)  . magnesium sulfate bolus IVPB     . sodium chloride   Intravenous Once  .  aspirin EC  81 mg Oral Daily  . atorvastatin  80 mg Oral Q supper  . clopidogrel  75 mg Oral Q supper  . darbepoetin (ARANESP) injection - DIALYSIS  60 mcg Intravenous Q Wed-HD  . docusate sodium  100 mg Oral Daily  . gabapentin  300 mg Oral q morning - 10a  . heparin  5,000 Units Subcutaneous Q8H  . loratadine  10 mg Oral Daily  . pantoprazole  40 mg Oral Daily  . sodium chloride flush  3 mL Intravenous Q12H  . sodium chloride flush  3 mL Intravenous Q12H  . sucroferric oxyhydroxide  1,000 mg Oral TID WC  . sucroferric oxyhydroxide  500 mg Oral With snacks  . vitamin B-12  1,000 mcg Oral Daily    Dialysis Orders:  [facility] MWF 4h 400/800 EDW 86kg 2K/2Ca UFP 4 Sensipar 60 TIW Calcitriol 1.25 TIW Mircera 50 q 2 weeks (last 4/29)  Assessment/Plan: 1. ESRD - Usually MWF. Does not feel well today. Defers HD. Plan next HD 5/16 off schedule.  - Would limit gabapentin to no more than 300 mg daily given ESRD.  Please avoid morphine for pain control   2 Hyperkalemia - 2 K bath with next  HD. K Got [place] last night. K 4.9   3. PAD with critical limb ischemia - left lower extremity  - s/p left common femoral to below knee popliteal artery bypass graft- no issues   4.  Ischemic cardiomyopathy  - Optimizing volume with HD - UF to EDW as tolerated   5.  Anemia  - Secondary in part to ESRD as well as acute blood loss from procedure  - on aranesp and nulecit  - Transfuse 1 unit PRBC's - done on 5/13- down low again- not helping the way he feels - may need another transfusion tomorrow with HD  6.  Secondary hyperparathyroidism, metabolic bone disease  - On velphoro. Resume Sensipar, calcitriol    7. Elevated troponin- s/p cath with severe single vessel CAD chronic total chronic occlusion of RCA. Medical therapy recommended. No targets for PCI.    Lynnda Child PA-C [facility] Pager [phone] 07/25/2018,10:08 AM  LOS: 3 days   Patient seen and examined,  agree with above note with above modifications. Still does not feel that great- I dont know how much is anemia vs ongoing cardiac ischemia - letting him rest today and will do dialysis tomorrow off schedule - then Monday to get back on schedule  [name], MD 07/25/2018

## 2018-07-25 NOTE — Progress Notes (Signed)
2D Echocardiogram has been performed.  Jose Garrett 07/25/2018, 4:00 PM

## 2018-07-25 NOTE — Telephone Encounter (Signed)
sch appt lvm [facility]ld ltr 08/15/2018 11am p/o MD

## 2018-07-25 NOTE — Progress Notes (Addendum)
[facility]  Subjective  - Rested OK last night.  He states he is having a very hard time moving due to pain in his left LE.   Objective 96/68 91 98.5 F (36.9 C) (Oral) 20 95%  Intake/Output Summary (Last 24 hours) at 07/25/2018 0716 Last data filed at 07/24/2018 1800 Gross per 24 hour  Intake 0 ml  Output -  Net 0 ml   Right groin soft  Left groin soft and left LE incision healing well Left Brisk doppler signals DP/PT.  Moderate edema in the left foot. Heart RRR Lungs non labored breathing  Post op ABI +-------+-----------+-----------+------------+------------+ ABI/TBIToday's ABIToday's TBIPrevious ABIPrevious TBI +-------+-----------+-----------+------------+------------+ Right  1.00                                           +-------+-----------+-----------+------------+------------+ Left   2.21       0.44                                +-------+-----------+-----------+------------+------------+  Assessment/Planning: POD # PAD   68 y.o. male is s/p left common femoral to below-knee popliteal artery bypass with graft. Plan for repeat heart cath with elevated troponin's  1. Severe single vessel CAD with chronic total occlusion of the RCA, collateralized with L--->R collaterals 2. Mild calcific nonobstructive LCA disease, anatomy unchanged from previous cath studies 3. At least moderate AS, possibly severe LFLG - will check echo Echo pending today  Gen he still does not feel well and has poor appetite.    Roxy Horseman 07/25/2018 7:16 AM -- Foot warm Doppler signals Incisions healing Appreciate cardiology input Needs to eat Blood loss anemia may transfuse if remains lethargic or tachycardic  Ruta Hinds, MD [facility]: [phone] Pager: [phone]   Laboratory Lab Results: Recent Labs    07/24/18 0314 07/25/18 0520  WBC 10.8* 9.0  HGB 8.0* 7.3*   HCT 24.0* 21.9*  PLT 130* 124*   BMET Recent Labs    07/24/18 0314 07/25/18 0520  NA 137 134*  K 5.4* 4.9  CL 93* 92*  CO2 28 25  GLUCOSE 135* 113*  BUN 29* 59*  CREATININE 6.24* 9.08*  CALCIUM 9.5 9.2    COAG Lab Results  Component Value Date   INR 1.2 07/22/2018   INR 1.27 01/23/2016   INR 1.10 (L) 01/21/2015   PROTIME 13.2 01/21/2015   Lab Results  Component Value Date   PTT 48 (H) 10/11/2014

## 2018-07-25 NOTE — TOC Initial Note (Signed)
Transition of Care (TOC) - Initial/Assessment Note  Marvetta Gibbons RN, BSN Transitions of Care Unit 4E- RN Case Manager [phone]   Patient Details  Name: Jose Garrett MRN: [id number] Date of Birth: 1951/02/10  Transition of Care [facility]) CM/SW Contact:    Dawayne Patricia, RN Phone Number: 07/25/2018, 4:04 PM  Clinical Narrative:                 Pt admitted s/p left fem pop, post op with elevated troponin- plan for repeat heart cath, CIR following for possible consult/admission pending pt progress. Pt may go to daughter's home on transition home. (hx ESRD- HD MWF)- CM will follow for transition planning/needs  Expected Discharge Plan: IP Rehab Facility Barriers to Discharge: Continued Medical Work up   Patient Goals and CMS Choice        Expected Discharge Plan and Services Expected Discharge Plan: [place] In-house Referral: Clinical Social Work Discharge Planning Services: CM Consult Post Acute Care Choice: IP Rehab Living arrangements for the past 2 months: Apartment Expected Discharge Date: 07/24/18                                    Prior Living Arrangements/Services Living arrangements for the past 2 months: Apartment Lives with:: Self Patient language and need for interpreter reviewed:: Yes        Need for Family Participation in Patient Care: Yes (Comment) Care giver support system in place?: Yes (comment) Current home services: DME Criminal Activity/Legal Involvement Pertinent to Current Situation/Hospitalization: No - Comment as needed  Activities of Daily Living Home Assistive Devices/Equipment: Eyeglasses, Cane (specify quad or straight) ADL Screening (condition at time of admission) Patient's cognitive ability adequate to safely complete daily activities?: Yes Is the patient deaf or have difficulty hearing?: No Does the patient have difficulty seeing, even when wearing glasses/contacts?: No Does the patient have difficulty  concentrating, remembering, or making decisions?: No Patient able to express need for assistance with ADLs?: Yes Does the patient have difficulty dressing or bathing?: No Independently performs ADLs?: Yes (appropriate for developmental age) Does the patient have difficulty walking or climbing stairs?: Yes Weakness of Legs: None Weakness of Arms/Hands: None  Permission Sought/Granted                  Emotional Assessment Appearance:: Appears stated age Attitude/Demeanor/Rapport: Engaged Affect (typically observed): Appropriate Orientation: : Oriented to Self, Oriented to Situation, Oriented to Place, Oriented to  Time Alcohol / Substance Use: Not Applicable Psych Involvement: No (comment)  Admission diagnosis:  critical limb ischemia Patient Active Problem List   Diagnosis Date Noted  . PAD (peripheral artery disease) ([place]) 07/22/2018  . Shortness of breath 03/25/2018  . Chest pain   . Dyspnea 03/23/2018  . Acute on chronic combined systolic and diastolic CHF (congestive heart failure) ([place]) 11/30/2017  . Non-ST elevation (NSTEMI) myocardial infarction ([place])   . Elevated troponin 11/25/2017  . Respiratory failure ([place])   . Neuropathy 03/14/2017  . Fever   . CKD (chronic kidney disease) stage 5, GFR less than 15 ml/min (HCC)   . ESRD (end stage renal disease) on dialysis ([place])   . Acute renal failure superimposed on chronic kidney disease ([place])   . Hypervolemia   . Acute diastolic (congestive) heart failure ([place]) 01/23/2016  . Fluid overload, unspecified 01/23/2016  . Acute pulmonary edema ([place]) 01/23/2016  . Skin tear of right forearm without  complication 19/59/7471  . Iron deficiency anemia 11/22/2015  . Nonrheumatic aortic valve stenosis 08/22/2015  . Chronic kidney disease with dialysis modality undecided, stage 5 ([place]): Progressive 08/01/2015  . B12 deficiency 07/19/2015  . Seasonal allergies 06/30/2015  . Back pain 03/18/2015  . Hip pain 02/18/2015  . AL  amyloidosis ([place]) 09/25/2014  . Anemia of chronic disease 09/25/2014  . Abnormal bruising 09/25/2014  . Renal hematoma, right 09/08/2014  . Chronic diastolic heart failure, NYHA class 1 ([place]) 09/08/2014  . Hematuria   . Proteinuria   . Vitamin D deficiency 01/21/2014  . Hypertriglyceridemia 01/21/2014  . Essential hypertension, benign 11/06/2013  . Dependent edema 11/06/2013  . DOE (dyspnea on exertion) 11/06/2013  . Other malaise and fatigue 11/06/2013   PCP:  Patient, No Pcp Per Pharmacy:   [facility], [address] [place] [address] Phone: [phone] Fax: [phone]     Social Determinants of Health (SDOH) Interventions    Readmission Risk Interventions No flowsheet data found.

## 2018-07-25 NOTE — Telephone Encounter (Signed)
-----   Message from Gabriel Earing, [place] sent at 07/22/2018 10:16 AM EDT ----- S/p left fem pop bypass graft 07/22/2018.  F/u with Dr. Donzetta Matters in 2-3 weeks.  Thanks

## 2018-07-25 NOTE — Progress Notes (Signed)
Rehab Admissions Coordinator Note:  Patient was screened by Cleatrice Burke for appropriateness for an Inpatient Acute Rehab Consult per OT recs. Original PT recs for [facility]. Patient limited by pain for fearful of taking pain meds that will make him nauseated. I would like to see how he progress with mobility as pain is controlled. I will follow.     Cleatrice Burke RN MSN 07/25/2018, 2:52 PM  I can be reached at [phone].

## 2018-07-25 NOTE — Progress Notes (Addendum)
Physical Therapy Treatment Patient Details Name: Jose Garrett MRN: [id number] DOB: Apr 27, 1950 Today's Date: 07/25/2018    History of Present Illness Admitted with abnormal vascular studies, now s/p Left common femoral to below-knee popliteal artery bypass graft; extensive medical problems including ESRD on HD, AL amyloid (kidney involvement), aortic stenosis, HTN, HLD, GERD, anemia, chronic combined CHF and CAD     PT Comments    Pt performed gait training and functional mobility with max cues for encouragement.  Pt required moderate assistance to achieve standing and advance 12 ft of gait training with close chair follow.  Pt is not progressing as well as initially thought, therefore update in recommendations are needed.  Will inform supervising PT for change in recommendations at this time.  Pt would benefit from aggressive therapies in this setting to return home with improved function.      Follow Up Recommendations  CIR;Supervision/Assistance - 24 hour     Equipment Recommendations  Rolling walker with 5" wheels;3in1 (PT)    Recommendations for Other Services       Precautions / Restrictions Precautions Precautions: Fall Restrictions Weight Bearing Restrictions: No    Mobility  Bed Mobility Overal bed mobility: Needs Assistance Bed Mobility: Supine to Sit     Supine to sit: Mod assist Sit to supine: Mod assist   General bed mobility comments: Pt required assistance to advance LEs to edge of bed and to elevate trunk into sitting at edge of bed.    Transfers Overall transfer level: Needs assistance Equipment used: Rolling walker (2 wheeled) Transfers: Sit to/from Stand Sit to Stand: Mod assist         General transfer comment: Pt required increased assistance to boost into standing.  Required hand of hand assistance to place on RW grip.  Pt required assistance to achieve trunk and hip extension.    Ambulation/Gait Ambulation/Gait assistance: Mod assist;+2  safety/equipment Gait Distance (Feet): 12 Feet Assistive device: Rolling walker (2 wheeled) Gait Pattern/deviations: Step-to pattern;Trunk flexed;Antalgic;Decreased weight shift to left     General Gait Details: Close chair follow, cues for upper trunk control, assistance to weight shifting and advance RW forward.   Stairs             Wheelchair Mobility    Modified Rankin (Stroke Patients Only)       Balance Overall balance assessment: Needs assistance   Sitting balance-Leahy Scale: Fair       Standing balance-Leahy Scale: Poor Standing balance comment: heavily dependent on RW                            Cognition Arousal/Alertness: Awake/alert Behavior During Therapy: WFL for tasks assessed/performed Overall Cognitive Status: Within Functional Limits for tasks assessed                                        Exercises      General Comments        Pertinent Vitals/Pain Pain Assessment: 0-10 Pain Score: 6  Pain Location: L LE Pain Descriptors / Indicators: Aching;Grimacing;Guarding;Moaning Pain Intervention(s): Monitored during session;Repositioned    Home Living Family/patient expects to be discharged to:: Private residence Living Arrangements: Alone Available Help at Discharge: Family;Available PRN/intermittently Type of Home: Apartment Home Access: Stairs to enter Entrance Stairs-Rails: Right Home Layout: One level Home Equipment: Cane - single point Additional Comments: may go  to his daughter's home    Prior Function Level of Independence: Independent with assistive device(s)      Comments: Consistently uses cane to walk   PT Goals (current goals can now be found in the care plan section) Acute Rehab PT Goals Patient Stated Goal: less pain in LLE Potential to Achieve Goals: Good Progress towards PT goals: Progressing toward goals    Frequency    Min 3X/week      PT Plan Discharge plan needs to be  updated    Co-evaluation              AM-PAC PT "6 Clicks" Mobility   Outcome Measure  Help needed turning from your back to your side while in a flat bed without using bedrails?: A Little Help needed moving from lying on your back to sitting on the side of a flat bed without using bedrails?: A Lot Help needed moving to and from a bed to a chair (including a wheelchair)?: A Lot Help needed standing up from a chair using your arms (e.g., wheelchair or bedside chair)?: A Little Help needed to walk in hospital room?: A Little Help needed climbing 3-5 steps with a railing? : A Little 6 Click Score: 16    End of Session Equipment Utilized During Treatment: Gait belt Activity Tolerance: Patient limited by pain Patient left: with call bell/phone within reach;in chair;with chair alarm set Nurse Communication: Mobility status PT Visit Diagnosis: Unsteadiness on feet (R26.81);Other abnormalities of gait and mobility (R26.89);Pain Pain - Right/Left: Left Pain - part of body: Leg     Time: 0037-0488 PT Time Calculation (min) (ACUTE ONLY): 23 min  Charges:  $Gait Training: 8-22 mins $Therapeutic Activity: 8-22 mins                     Governor Rooks, PTA Acute Rehabilitation Services Pager [phone] Office [place] 07/25/2018, 5:11 PM

## 2018-07-26 LAB — CBC
HCT: 21.4 % — ABNORMAL LOW (ref 39.0–52.0)
Hemoglobin: 7.2 g/dL — ABNORMAL LOW (ref 13.0–17.0)
MCH: 32.3 pg (ref 26.0–34.0)
MCHC: 33.6 g/dL (ref 30.0–36.0)
MCV: 96 fL (ref 80.0–100.0)
Platelets: 117 10*3/uL — ABNORMAL LOW (ref 150–400)
RBC: 2.23 MIL/uL — ABNORMAL LOW (ref 4.22–5.81)
RDW: 15.5 % (ref 11.5–15.5)
WBC: 7.6 10*3/uL (ref 4.0–10.5)
nRBC: 0.8 % — ABNORMAL HIGH (ref 0.0–0.2)

## 2018-07-26 LAB — BASIC METABOLIC PANEL
Anion gap: 17 — ABNORMAL HIGH (ref 5–15)
BUN: 84 mg/dL — ABNORMAL HIGH (ref 8–23)
CO2: 22 mmol/L (ref 22–32)
Calcium: 8.7 mg/dL — ABNORMAL LOW (ref 8.9–10.3)
Chloride: 94 mmol/L — ABNORMAL LOW (ref 98–111)
Creatinine, Ser: 10.83 mg/dL — ABNORMAL HIGH (ref 0.61–1.24)
GFR calc Af Amer: 5 mL/min — ABNORMAL LOW (ref >60)
GFR calc non Af Amer: 4 mL/min — ABNORMAL LOW (ref >60)
Glucose, Bld: 113 mg/dL — ABNORMAL HIGH (ref 70–99)
Potassium: 5.4 mmol/L — ABNORMAL HIGH (ref 3.5–5.1)
Sodium: 133 mmol/L — ABNORMAL LOW (ref 135–145)

## 2018-07-26 LAB — PREPARE RBC (CROSSMATCH)

## 2018-07-26 MED ORDER — SODIUM CHLORIDE 0.9% IV SOLUTION: Freq: Once | INTRAVENOUS | Status: DC

## 2018-07-26 MED ORDER — SODIUM CHLORIDE 0.9 % IV BOLUS
500.0000 mL | Freq: Once | INTRAVENOUS | Status: AC
  Administered 2018-07-26: 21:00:00 500 mL via INTRAVENOUS

## 2018-07-26 MED ORDER — HEPARIN SODIUM (PORCINE) 1000 UNIT/ML IJ SOLN
INTRAMUSCULAR | Status: AC
  Administered 2018-07-26: 1800 [IU]
  Filled 2018-07-26: qty 2

## 2018-07-26 MED ORDER — SODIUM CHLORIDE 0.9 % IV BOLUS
250.0000 mL | Freq: Once | INTRAVENOUS | Status: AC
  Administered 2018-07-26: 22:00:00 250 mL via INTRAVENOUS

## 2018-07-26 NOTE — Progress Notes (Signed)
This nurse was informed by Lorra Hals Rn that the 1 unit of PRBC will not be given today

## 2018-07-26 NOTE — Progress Notes (Signed)
Pt's BP was 70/48. Seemed very lethargic and forgetful. Paged Dr. Oneida Alar and he ordered a 500 cc normal saline bolus.  Once bolus was completed, the BP was still low at 69/50.  Heart rate was normal sinus rhythm at 93 bpm but when moved around it would jump to the low 100s.  Dr. Oneida Alar was paged again and he ordered a one time 250 cc IV bolus and to transfer pt to ICU.  Receiving nurse in 2 Heart has been given report.  Lupita Dawn, RN

## 2018-07-26 NOTE — Progress Notes (Signed)
Pt arrived to 4E after 1.4 litters pulled out from hemodialysis. Report given by [facility]. Pt is quite lethargic and having involuntary muscle spasm at hip area. He is oriented x 4 and able to follow commands. At arrival hiis BP is 93/54 mmHg, HR 104, sinus tachycardia, SPO2 100% with room air. Henna, RN who took care of him a few days ago stated Pt had these the same symptoms after hemodialysis. MD was aware and no order received at that time. After 30 minutes later his hip muscle spasm is disappeared. I will continue to monitor.  Kennyth Lose, BSN,RN,PCCN,CMC,CSC

## 2018-07-26 NOTE — Progress Notes (Signed)
Physical Therapy Treatment Patient Details Name: Jose Garrett MRN: [id number] DOB: [birth year] Today's Date: 07/26/2018    History of Present Illness Admitted with abnormal vascular studies, now s/p Left common femoral to below-knee popliteal artery bypass graft; extensive medical problems including ESRD on HD, AL amyloid (kidney involvement), aortic stenosis, HTN, HLD, GERD, anemia, chronic combined CHF and CAD     PT Comments    Pt agreeable to therapy despite nausea. Pt with steady progress towards his goals, however pt continues to be limited in safe mobility by L LE pain, as well as decreased strength and endurance. Pt requires modA for bed mobility and transfers and minA for ambulation of 20 feet with RW and chair follow. D/c plans remain appropriate at this time. PT will continue to follow acutely.     Follow Up Recommendations  CIR;Supervision/Assistance - 24 hour     Equipment Recommendations  Rolling walker with 5" wheels;3in1 (PT)       Precautions / Restrictions Precautions Precautions: Fall Restrictions Weight Bearing Restrictions: No    Mobility  Bed Mobility Overal bed mobility: Needs Assistance Bed Mobility: Supine to Sit     Supine to sit: Mod assist     General bed mobility comments: Pt required assistance to advance LEs to edge of bed and to elevate trunk into sitting at edge of bed.    Transfers Overall transfer level: Needs assistance Equipment used: Rolling walker (2 wheeled) Transfers: Sit to/from Stand Sit to Stand: Mod assist;+2 physical assistance         General transfer comment: modA for power up and steadyining at RW, vc for hand placement for powerup as well as posterior pelvic tilt to achieve upright  Ambulation/Gait Ambulation/Gait assistance: Min assist Gait Distance (Feet): 20 Feet Assistive device: Rolling walker (2 wheeled) Gait Pattern/deviations: Step-to pattern;Trunk flexed;Antalgic;Decreased weight shift to left Gait  velocity: slowed Gait velocity interpretation: <1.8 ft/sec, indicate of risk for recurrent falls General Gait Details: minA for steadying, vc for sequencing, upright posture and weightshift for LE advancement.         Balance Overall balance assessment: Needs assistance Sitting-balance support: No upper extremity supported;Feet supported Sitting balance-Leahy Scale: Fair     Standing balance support: Bilateral upper extremity supported Standing balance-Leahy Scale: Poor Standing balance comment: requires UE support to maintain balance                            Cognition Arousal/Alertness: Awake/alert Behavior During Therapy: WFL for tasks assessed/performed Overall Cognitive Status: Within Functional Limits for tasks assessed                                           General Comments General comments (skin integrity, edema, etc.): Pt with nausea throughout session, RN notified and medication dispensed      Pertinent Vitals/Pain Pain Assessment: Faces Faces Pain Scale: Hurts little more Pain Location: L LE Pain Descriptors / Indicators: Aching;Grimacing;Guarding;Moaning Pain Intervention(s): Limited activity within patient's tolerance;Monitored during session;Repositioned           PT Goals (current goals can now be found in the care plan section) Acute Rehab PT Goals PT Goal Formulation: With patient Time For Goal Achievement: 08/05/18 Potential to Achieve Goals: Good Progress towards PT goals: Progressing toward goals    Frequency    Min 3X/week  PT Plan Current plan remains appropriate       AM-PAC PT "6 Clicks" Mobility   Outcome Measure  Help needed turning from your back to your side while in a flat bed without using bedrails?: A Little Help needed moving from lying on your back to sitting on the side of a flat bed without using bedrails?: A Lot Help needed moving to and from a bed to a chair (including a  wheelchair)?: A Lot Help needed standing up from a chair using your arms (e.g., wheelchair or bedside chair)?: A Lot Help needed to walk in hospital room?: A Little Help needed climbing 3-5 steps with a railing? : A Lot 6 Click Score: 14    End of Session Equipment Utilized During Treatment: Gait belt Activity Tolerance: Patient limited by pain Patient left: in chair;with call bell/phone within reach Nurse Communication: Mobility status;Other (comment)(need for nausea medication) PT Visit Diagnosis: Unsteadiness on feet (R26.81);Other abnormalities of gait and mobility (R26.89);Pain Pain - Right/Left: Left Pain - part of body: Leg     Time: 3435-6861 PT Time Calculation (min) (ACUTE ONLY): 33 min  Charges:  $Gait Training: 23-37 mins                     Masha Orbach B. Migdalia Dk PT, DPT Acute Rehabilitation Services Pager [phone] Office [phone]    [place] 07/26/2018, 12:02 PM

## 2018-07-26 NOTE — Progress Notes (Signed)
Pt's HB 7.2 this morning. MD ordered for type& screen and 1 unit of PRBC. Pt will go to hemodialysis today. Report given to Thalma, RN at hemodialysis department for Pt's blood transfusion after dialysis.   Kennyth Lose, BSN,RN,PCCN-CMC,CSC

## 2018-07-26 NOTE — Progress Notes (Signed)
DAILY PROGRESS NOTE   Patient Name: Jose Garrett Date of Encounter: 07/26/2018 Cardiologist: Lauree Chandler, MD  Chief Complaint   No complaints  Patient Profile   68 y.o. male with a hx of ESRD on HD, AL amyloid, aortic stenosis, HTN, GERD, anemia, combined HF and CADwhowas seen for elevated troponin.  Subjective   Seen in HD today - I informed him of the results of his echo which show worsening of LV function to 35-40% and it appears that he now has moderate to severe aortic stenosis. He was found to have a CTO of the RCA with collaterals at cath and no intervention was performed.  Objective   Vitals:   07/26/18 1430 07/26/18 1500 07/26/18 1530 07/26/18 1648  BP: (!) 94/55 (!) 95/57 (!) 96/57 (!) 93/54  Pulse: 90 92 97 (!) 104  Resp:      Temp:    99.1 F (37.3 C)  TempSrc:    Oral  SpO2:    100%  Weight:      Height:        Intake/Output Summary (Last 24 hours) at 07/26/2018 1659 Last data filed at 07/26/2018 0900 Gross per 24 hour  Intake 750 ml  Output -  Net 750 ml   Filed Weights   07/26/18 0439 07/26/18 1130 07/26/18 1150  Weight: 89.7 kg 90.5 kg 90.5 kg    Physical Exam   General appearance: alert and no distress Lungs: clear to auscultation bilaterally Heart: regular rate and rhythm, S1, S2 normal and systolic murmur: systolic ejection 3/6, crescendo at 2nd right intercostal space Extremities: extremities normal, atraumatic, no cyanosis or edema Neurologic: Grossly normal Psych: Pleasant  Inpatient Medications    Scheduled Meds: . sodium chloride   Intravenous Once  . sodium chloride   Intravenous Once  . aspirin EC  81 mg Oral Daily  . atorvastatin  80 mg Oral Q supper  . [START ON 07/28/2018] calcitRIOL  1.25 mcg Oral Q M,W,F-HD  . [START ON 07/28/2018] cinacalcet  60 mg Oral Q M,W,F-HD  . clopidogrel  75 mg Oral Q supper  . darbepoetin (ARANESP) injection - DIALYSIS  60 mcg Intravenous Q Wed-HD  . docusate sodium  100 mg Oral  Daily  . gabapentin  300 mg Oral q morning - 10a  . heparin  5,000 Units Subcutaneous Q8H  . loratadine  10 mg Oral Daily  . pantoprazole  40 mg Oral Daily  . Ensure Max Protein  11 oz Oral TID WC  . sodium chloride flush  3 mL Intravenous Q12H  . sodium chloride flush  3 mL Intravenous Q12H  . sucroferric oxyhydroxide  1,000 mg Oral TID WC  . sucroferric oxyhydroxide  500 mg Oral With snacks  . vitamin B-12  1,000 mcg Oral Daily    Continuous Infusions: . sodium chloride    . sodium chloride    . ferric gluconate (FERRLECIT/NULECIT) IV Stopped (07/23/18 1140)  . magnesium sulfate bolus IVPB      PRN Meds: sodium chloride, sodium chloride, acetaminophen **OR** acetaminophen, bisacodyl, guaiFENesin-dextromethorphan, magnesium sulfate bolus IVPB, metoprolol tartrate, ondansetron, oxyCODONE, oxyCODONE, phenol, polyethylene glycol, sodium chloride flush   Labs   Results for orders placed or performed during the hospital encounter of 07/22/18 (from the past 48 hour(s))  CBC     Status: Abnormal   Collection Time: 07/25/18  5:20 AM  Result Value Ref Range   WBC 9.0 4.0 - 10.5 K/uL   RBC 2.25 (L) 4.22 - 5.81  MIL/uL   Hemoglobin 7.3 (L) 13.0 - 17.0 g/dL   HCT 21.9 (L) 39.0 - 52.0 %   MCV 97.3 80.0 - 100.0 fL   MCH 32.4 26.0 - 34.0 pg   MCHC 33.3 30.0 - 36.0 g/dL   RDW 16.1 (H) 11.5 - 15.5 %   Platelets 124 (L) 150 - 400 K/uL    Comment: REPEATED TO VERIFY Immature Platelet Fraction may be clinically indicated, consider ordering this additional test [id number] CONSISTENT WITH PREVIOUS RESULT    nRBC 0.2 0.0 - 0.2 %    Comment: Performed at [facility], [place] [address], [place], [place] [address]  Renal function panel     Status: Abnormal   Collection Time: 07/25/18  5:20 AM  Result Value Ref Range   Sodium 134 (L) 135 - 145 mmol/L   Potassium 4.9 3.5 - 5.1 mmol/L   Chloride 92 (L) 98 - 111 mmol/L   CO2 25 22 - 32 mmol/L   Glucose, Bld 113 (H) 70 - 99 mg/dL   BUN 59  (H) 8 - 23 mg/dL   Creatinine, Ser 9.08 (H) 0.61 - 1.24 mg/dL   Calcium 9.2 8.9 - 10.3 mg/dL   Phosphorus 8.6 (H) 2.5 - 4.6 mg/dL   Albumin 3.2 (L) 3.5 - 5.0 g/dL   GFR calc non Af Amer 5 (L) >60 mL/min   GFR calc Af Amer 6 (L) >60 mL/min   Anion gap 17 (H) 5 - 15    Comment: Performed at [facility], [address] [place], [place] [address]  CBC     Status: Abnormal   Collection Time: 07/26/18  2:33 AM  Result Value Ref Range   WBC 7.6 4.0 - 10.5 K/uL   RBC 2.23 (L) 4.22 - 5.81 MIL/uL   Hemoglobin 7.2 (L) 13.0 - 17.0 g/dL   HCT 21.4 (L) 39.0 - 52.0 %   MCV 96.0 80.0 - 100.0 fL   MCH 32.3 26.0 - 34.0 pg   MCHC 33.6 30.0 - 36.0 g/dL   RDW 15.5 11.5 - 15.5 %   Platelets 117 (L) 150 - 400 K/uL    Comment: REPEATED TO VERIFY PLATELET COUNT CONFIRMED BY SMEAR SPECIMEN CHECKED FOR CLOTS Immature Platelet Fraction may be clinically indicated, consider ordering this additional test [id number]    nRBC 0.8 (H) 0.0 - 0.2 %    Comment: Performed at [facility], [place] [address] [place], [place] [address]  Basic metabolic panel     Status: Abnormal   Collection Time: 07/26/18  2:33 AM  Result Value Ref Range   Sodium 133 (L) 135 - 145 mmol/L   Potassium 5.4 (H) 3.5 - 5.1 mmol/L   Chloride 94 (L) 98 - 111 mmol/L   CO2 22 22 - 32 mmol/L   Glucose, Bld 113 (H) 70 - 99 mg/dL   BUN 84 (H) 8 - 23 mg/dL   Creatinine, Ser 10.83 (H) 0.61 - 1.24 mg/dL   Calcium 8.7 (L) 8.9 - 10.3 mg/dL   GFR calc non Af Amer 4 (L) >60 mL/min   GFR calc Af Amer 5 (L) >60 mL/min   Anion gap 17 (H) 5 - 15    Comment: Performed at [facility] [address] [place], [place] [address]  Prepare RBC     Status: None   Collection Time: 07/26/18  9:06 AM  Result Value Ref Range   Order Confirmation      ORDER PROCESSED  BY BLOOD BANK Performed at [facility], [place] [address], [place], [place] [address]   Type and screen [place]     Status: None   Collection Time:  07/26/18  9:21 AM  Result Value Ref Range   ABO/RH(D) O POS    Antibody Screen NEG    Sample Expiration      07/29/2018,2359 Performed at [facility], [place] [address], [place], [place] [address]     ECG   N/A  Telemetry   N/A  Radiology    No results found.  Cardiac Studies   Procedure: 2D Echo, Cardiac Doppler and Color Doppler  Indications:    Aoritc stenosis   History:        Patient has prior history of Echocardiogram examinations, most                 recent 03/24/2018. CAD PVD Aortic Valve Disease. ESRD.   Sonographer:    Dustin Flock Referring Phys: [place]    1. The left ventricle has moderately reduced systolic function, with an ejection fraction of 35-40%. The cavity size was normal. There is moderately increased left ventricular wall thickness. Left ventricular diastolic Doppler parameters are consistent  with impaired relaxation. Elevated left atrial and left ventricular end-diastolic pressures The E/e' is >15.  2. Moderate akinesis of the left ventricular, entire inferior wall.  3. The right ventricle has normal systolic function. The cavity was normal. There is no increase in right ventricular wall thickness.  4. The mitral valve is abnormal. Mild thickening of the mitral valve leaflet. There is mild mitral annular calcification present.  5. The tricuspid valve is grossly normal.  6. The aortic valve is tricuspid. Moderate calcification of the aortic valve. Aortic valve regurgitation is mild by color flow Doppler. Moderate-severe stenosis of the aortic valve.  7. The inferior vena cava was dilated in size with <50% respiratory variability.  8. The interatrial septum was not assessed.  9. When compared to the prior study: 03/24/2018: LVEF 40-45%, moderate aortic stenosis - mean gradient 30 mmHg.  SUMMARY   LVEF 35-40%, severe inferior hypokinesis to akinesis, moderate LVH, grade 1 DD, elevated LV filling pressure,  MAC with mild MR, calcified aortic valve with moderate to severe aortic stenosis (mean gradient 30 mmHg) - calculated AVA around 0.9 cm2, dilated IVC  Assessment   Active Problems:   Nonrheumatic aortic valve stenosis   Non-ST elevation (NSTEMI) myocardial infarction [facility])   PAD (peripheral artery disease) ([place])   Plan   1. [name] had an NSTEMI - likely type 2 demand ischemia in the setting of a CTO of the RCA with collaterals. He is also noted to have moderate to more likely severe aortic stenosis and slightly lower LVEF of 35-40% on echo. Volume status is being managed on dialysis. May need to consider work-up for TAVR at some point. Can d/w structural heart team next week.  Time Spent Directly with Patient:  I have spent a total of 25 minutes with the patient reviewing hospital notes, telemetry, EKGs, labs and examining the patient as well as establishing an assessment and plan that was discussed personally with the patient.  > 50% of time was spent in direct patient care.  Length of Stay:  LOS: 4 days   Pixie Casino, MD, [facility], [place] Director of the Advanced Lipid Disorders &  Cardiovascular Risk Reduction Clinic Diplomate  of the [employer] of Clinical Lipidology Attending Cardiologist  Direct Dial: [phone]  Fax: [phone]  Website:  [email or web address] Janita Camberos 07/26/2018, 4:59 PM

## 2018-07-26 NOTE — Progress Notes (Addendum)
[facility] [place] a little more and ambulated with assitance to the door yesterday.   Objective 106/67 93 98.4 F (36.9 C) (Oral) 18 98%  Intake/Output Summary (Last 24 hours) at 07/26/2018 1004 Last data filed at 07/26/2018 0900 Gross per 24 hour  Intake 990 ml  Output -  Net 990 ml    Groins soft B  Left LE incision healing well Mod left LE edema, but improving.  Brisk DP/PT doppler signals Heart RRR Lungs non labored breathing  Assessment/Planning: 68 y.o.maleis s/pleft common femoral to below-knee popliteal artery bypass with graft.  Generalized not feeling well, but slowly improving appetite and mobility Post heart cath recommending medical management no intervention. Elevated troponin with know CAD suspected demand ischemia in the setting of acute anemia and ESRD on HD.  Now off heparin but remains on ASA and Plavix. Continue statin.   PAD with patent blood flow in the left LE post bypass. Pending CIR verse HH.      Roxy Horseman 07/26/2018 10:04 AM -- Agree with above Rehab next week  Ruta Hinds, MD [facility] [place]: [phone] Pager: [phone]  Laboratory Lab Results: Recent Labs    07/25/18 0520 07/26/18 0233  WBC 9.0 7.6  HGB 7.3* 7.2*  HCT 21.9* 21.4*  PLT 124* 117*   BMET Recent Labs    07/25/18 0520 07/26/18 0233  NA 134* 133*  K 4.9 5.4*  CL 92* 94*  CO2 25 22  GLUCOSE 113* 113*  BUN 59* 84*  CREATININE 9.08* 10.83*  CALCIUM 9.2 8.7*    COAG Lab Results  Component Value Date   INR 1.2 07/22/2018   INR 1.27 01/23/2016   INR 1.10 (L) 01/21/2015   PROTIME 13.2 01/21/2015   Lab Results  Component Value Date   PTT 48 (H) 10/11/2014

## 2018-07-26 NOTE — Progress Notes (Signed)
Due to PRBC shortage, blood bank reported unable to release  1 unit of PRBC for Pt today.  Notified nephrologist Dr. Johnney Ou, MD aware. No new order received. Pt was transported to dialysis via bed. Vital signs remained stable.  Adonis Ryther,BSN,RN,PCCN-CMC,CSC

## 2018-07-26 NOTE — Progress Notes (Signed)
[facility] Progress Note   Subjective:  Due for dialysis today - still doesn't feel great  Objective Vitals:   07/26/18 0013 07/26/18 0400 07/26/18 0439 07/26/18 0700  BP: 100/65 104/70 100/65 106/67  Pulse:  86  93  Resp: 17 17 17 18   Temp: 98.6 F (37 C) 98.1 F (36.7 C)  98.4 F (36.9 C)  TempSrc: Oral Oral  Oral  SpO2: 96% 96% 98% 98%  Weight:   89.7 kg   Height:        Physical Exam General: WNWD elderly male NAD  Heart: RRR Lungs: CTAB Abdomen: soft NTND  Extremities: Left LE incision clean +pitting edema. No edema RLE  Dialysis Access: R forearm AVF +bruit   Weight change:    Additional Objective Labs: Basic Metabolic Panel: Recent Labs  Lab 07/24/18 0314 07/25/18 0520 07/26/18 0233  NA 137 134* 133*  K 5.4* 4.9 5.4*  CL 93* 92* 94*  CO2 28 25 22   GLUCOSE 135* 113* 113*  BUN 29* 59* 84*  CREATININE 6.24* 9.08* 10.83*  CALCIUM 9.5 9.2 8.7*  PHOS 7.1* 8.6*  --    CBC: Recent Labs  Lab 07/23/18 0242 07/23/18 1849 07/24/18 0314 07/25/18 0520 07/26/18 0233  WBC 8.5 9.6 10.8* 9.0 7.6  NEUTROABS  --  7.7  --   --   --   HGB 7.3* 7.4* 8.0* 7.3* 7.2*  HCT 22.5* 21.5* 24.0* 21.9* 21.4*  MCV 100.4* 94.3 96.8 97.3 96.0  PLT 140* 122* 130* 124* 117*   Blood Culture    Component Value Date/Time   SDES URINE, RANDOM 02/01/2016 1707   SPECREQUEST NONE 02/01/2016 1707   CULT (A) 02/01/2016 1707    10,000 COLONIES/mL PSEUDOMONAS AERUGINOSA 40,000 COLONIES/mL ENTEROBACTER AEROGENES    REPTSTATUS 02/04/2016 FINAL 02/01/2016 1707     Medications: . sodium chloride    . sodium chloride    . ferric gluconate (FERRLECIT/NULECIT) IV Stopped (07/23/18 1140)  . magnesium sulfate bolus IVPB     . sodium chloride   Intravenous Once  . aspirin EC  81 mg Oral Daily  . atorvastatin  80 mg Oral Q supper  . [START ON 07/28/2018] calcitRIOL  1.25 mcg Oral Q M,W,F-HD  . [START ON 07/28/2018] cinacalcet  60 mg Oral Q M,W,F-HD  . clopidogrel  75  mg Oral Q supper  . darbepoetin (ARANESP) injection - DIALYSIS  60 mcg Intravenous Q Wed-HD  . docusate sodium  100 mg Oral Daily  . gabapentin  300 mg Oral q morning - 10a  . heparin  5,000 Units Subcutaneous Q8H  . loratadine  10 mg Oral Daily  . pantoprazole  40 mg Oral Daily  . Ensure Max Protein  11 oz Oral TID WC  . sodium chloride flush  3 mL Intravenous Q12H  . sodium chloride flush  3 mL Intravenous Q12H  . sucroferric oxyhydroxide  1,000 mg Oral TID WC  . sucroferric oxyhydroxide  500 mg Oral With snacks  . vitamin B-12  1,000 mcg Oral Daily    Dialysis Orders:  [facility] MWF 4h 400/800 EDW 86kg 2K/2Ca UFP 4 Sensipar 60 TIW Calcitriol 1.25 TIW Mircera 50 q 2 weeks (last 4/29)  Assessment/Plan: 1. ESRD - Usually MWF. Does not feel well today. Defers HD. Plan HD 5/16 off schedule, then Monday to get back on schedule.    2 Hyperkalemia - 2 K bath with next HD. Got Veltassa 5/14. K 5.4 today , due for HD  3. PAD  with critical limb ischemia - left lower extremity  - s/p left common femoral to below knee popliteal artery bypass graft- no issues   4.  Ischemic cardiomyopathy  - Optimizing volume with HD - UF to EDW as tolerated   5.  Anemia  - Secondary in part to ESRD as well as acute blood loss from procedure  - on aranesp and nulecit  - Transfuse 1 unit PRBC's - done on 5/13- down low again- not helping the way he feels - will try another transfusion today with HD  6.  Secondary hyperparathyroidism, metabolic bone disease  - On velphoro,  Sensipar, calcitriol    7. Elevated troponin- s/p cath with severe single vessel CAD chronic total chronic occlusion of RCA. Medical therapy recommended. No targets for PCI.    [name], MD 07/26/2018

## 2018-07-27 ENCOUNTER — Encounter (HOSPITAL_COMMUNITY): Payer: Self-pay

## 2018-07-27 DIAGNOSIS — E861 Hypovolemia: Secondary | ICD-10-CM

## 2018-07-27 DIAGNOSIS — I9589 Other hypotension: Secondary | ICD-10-CM

## 2018-07-27 LAB — CBC
HCT: 21.1 % — ABNORMAL LOW (ref 39.0–52.0)
Hemoglobin: 6.8 g/dL — CL (ref 13.0–17.0)
MCH: 32.4 pg (ref 26.0–34.0)
MCHC: 32.2 g/dL (ref 30.0–36.0)
MCV: 100.5 fL — ABNORMAL HIGH (ref 80.0–100.0)
Platelets: 125 10*3/uL — ABNORMAL LOW (ref 150–400)
RBC: 2.1 MIL/uL — ABNORMAL LOW (ref 4.22–5.81)
RDW: 15.6 % — ABNORMAL HIGH (ref 11.5–15.5)
WBC: 9 10*3/uL (ref 4.0–10.5)
nRBC: 0.7 % — ABNORMAL HIGH (ref 0.0–0.2)

## 2018-07-27 LAB — PREPARE RBC (CROSSMATCH)

## 2018-07-27 MED ORDER — SODIUM CHLORIDE 0.9 % IV SOLN: 125.0000 mg | INTRAVENOUS | Status: DC

## 2018-07-27 MED ORDER — CHLORHEXIDINE GLUCONATE CLOTH 2 % EX PADS
6.0000 | MEDICATED_PAD | Freq: Every day | CUTANEOUS | Status: DC
  Administered 2018-07-27: 08:00:00 6 via TOPICAL

## 2018-07-27 MED ORDER — DARBEPOETIN ALFA 200 MCG/0.4ML IJ SOSY: 200.0000 ug | PREFILLED_SYRINGE | INTRAMUSCULAR | Status: DC

## 2018-07-27 MED ORDER — SODIUM CHLORIDE 0.9% IV SOLUTION
Freq: Once | INTRAVENOUS | Status: AC
  Administered 2018-07-27: 08:00:00 via INTRAVENOUS

## 2018-07-27 NOTE — Progress Notes (Signed)
Progress Note   Subjective   Patient remains week. SOB is better.  Denies CP.  No new concerns  Inpatient Medications    Scheduled Meds: . sodium chloride   Intravenous Once  . sodium chloride   Intravenous Once  . aspirin EC  81 mg Oral Daily  . atorvastatin  80 mg Oral Q supper  . [START ON 07/28/2018] calcitRIOL  1.25 mcg Oral Q M,W,F-HD  . Chlorhexidine Gluconate Cloth  6 each Topical Q0600  . [START ON 07/28/2018] cinacalcet  60 mg Oral Q M,W,F-HD  . clopidogrel  75 mg Oral Q supper  . [START ON 07/30/2018] darbepoetin (ARANESP) injection - DIALYSIS  200 mcg Intravenous Q Wed-HD  . docusate sodium  100 mg Oral Daily  . gabapentin  300 mg Oral q morning - 10a  . heparin  5,000 Units Subcutaneous Q8H  . loratadine  10 mg Oral Daily  . pantoprazole  40 mg Oral Daily  . Ensure Max Protein  11 oz Oral TID WC  . sodium chloride flush  3 mL Intravenous Q12H  . sodium chloride flush  3 mL Intravenous Q12H  . sucroferric oxyhydroxide  1,000 mg Oral TID WC  . sucroferric oxyhydroxide  500 mg Oral With snacks  . vitamin B-12  1,000 mcg Oral Daily   Continuous Infusions: . sodium chloride    . sodium chloride    . [START ON 07/30/2018] ferric gluconate (FERRLECIT/NULECIT) IV    . magnesium sulfate bolus IVPB     PRN Meds: sodium chloride, sodium chloride, acetaminophen **OR** acetaminophen, bisacodyl, guaiFENesin-dextromethorphan, magnesium sulfate bolus IVPB, metoprolol tartrate, ondansetron, oxyCODONE, oxyCODONE, phenol, polyethylene glycol, sodium chloride flush   Vital Signs    Vitals:   07/27/18 0745 07/27/18 0822 07/27/18 0830 07/27/18 0900  BP:    103/63  Pulse: 96  92   Resp: (!) 22  15 (!) 21  Temp: 98.1 F (36.7 C)  98.1 F (36.7 C)   TempSrc: Oral Oral Oral   SpO2: 100%  100%   Weight:      Height:        Intake/Output Summary (Last 24 hours) at 07/27/2018 1011 Last data filed at 07/27/2018 0900 Gross per 24 hour  Intake 1880.83 ml  Output 1400 ml  Net  480.83 ml   Filed Weights   07/26/18 1130 07/26/18 1150 07/26/18 1615  Weight: 90.5 kg 90.5 kg 89.2 kg    Telemetry    sinus - Personally Reviewed  Physical Exam   GEN- The patient is frail and weak appearing, alert and oriented x 3 today.   Head- normocephalic, atraumatic Eyes-  Sclera clear, conjunctiva pale Ears- hearing intact Oropharynx- clear Neck- supple, Lungs-  normal work of breathing Heart- Regular rate and rhythm  Extremities- no clubbing, cyanosis, or edema  MS- diffuse atrophy Skin- pale Psych- euthymic mood, full affect Neuro- strength and sensation are intact   Labs    Chemistry Recent Labs  Lab 07/22/18 0623  07/24/18 0314 07/25/18 0520 07/26/18 0233  NA 139   < > 137 134* 133*  K 4.0   < > 5.4* 4.9 5.4*  CL 89*   < > 93* 92* 94*  CO2 25   < > 28 25 22   GLUCOSE 118*   < > 135* 113* 113*  BUN 30*   < > 29* 59* 84*  CREATININE 7.72*   < > 6.24* 9.08* 10.83*  CALCIUM 9.1   < > 9.5 9.2 8.7*  PROT  6.8  --   --   --   --   ALBUMIN 3.4*  --  3.6 3.2*  --   AST 16  --   --   --   --   ALT 14  --   --   --   --   ALKPHOS 55  --   --   --   --   BILITOT 0.7  --   --   --   --   GFRNONAA 7*   < > 9* 5* 4*  GFRAA 8*   < > 10* 6* 5*  ANIONGAP 25*   < > 16* 17* 17*   < > = values in this interval not displayed.     Hematology Recent Labs  Lab 07/25/18 0520 07/26/18 0233 07/27/18 0430  WBC 9.0 7.6 9.0  RBC 2.25* 2.23* 2.10*  HGB 7.3* 7.2* 6.8*  HCT 21.9* 21.4* 21.1*  MCV 97.3 96.0 100.5*  MCH 32.4 32.3 32.4  MCHC 33.3 33.6 32.2  RDW 16.1* 15.5 15.6*  PLT 124* 117* 125*    Cardiac Enzymes Recent Labs  Lab 07/23/18 1239 07/23/18 1849 07/24/18 0816  TROPONINI 0.09* 5.25* >65.00*   No results for input(s): TROPIPOC in the last 168 hours.     Patient Profile   68 y.o.malewith a hx of ESRD on HD, AL amyloid, aortic stenosis, HTN, GERD, anemia, combined HF and CADwhowas seen for elevated troponin.  Assessment & Plan    1.   NSTEMI Likely demand ischemia with chronically occluded RCA with collateral flow.  Would maintain preload.  Treat anemia as you are (currently receiving PRBCs).  I agree with Dr Oneida Alar that hypotension is likely volume related  2. Acute on chronic systolic dysfunction/ ischemic CM Maintain preload severe AS.  Fluid removed with HD Medical therapy is very limited in the setting of symptomatic hypotension  3. Severe aortic stenosis Consider structural heart consultation once more stable  4. ESRD on HD Nephrology is following  5. PAD with critical limb ischemia S/p - left common femoral to below knee popliteal artery bypass graft-  Vascular following with plans for rehab once improved clinically  Thompson Grayer MD, [facility] 07/27/2018 10:11 AM

## 2018-07-27 NOTE — Progress Notes (Signed)
[facility] Progress Note   Subjective:  HD yesterday - removed 1400- then found to be hypotensive, moved to 2H- still reading hyptensive per noninvasive measure- HR 90's , o2 sat 100%-  Still feels puny-  I had wanted to give blood yesterday they said not low enough, is 6.8 this AM- going to get unit  Objective Vitals:   07/27/18 0530 07/27/18 0600 07/27/18 0630 07/27/18 0700  BP: (!) 83/57 (!) 85/59 93/63 90/65   Pulse: 94 94 97 94  Resp:    19  Temp:      TempSrc:      SpO2: 97% 100% 100% 99%  Weight:      Height:        Physical Exam General: WNWD elderly male NAD  Heart: RRR Lungs: CTAB Abdomen: soft NTND  Extremities: Left LE incision clean +pitting edema. No edema RLE  Dialysis Access: R forearm AVF +bruit   Weight change: 0.8 kg   Additional Objective Labs: Basic Metabolic Panel: Recent Labs  Lab 07/24/18 0314 07/25/18 0520 07/26/18 0233  NA 137 134* 133*  K 5.4* 4.9 5.4*  CL 93* 92* 94*  CO2 28 25 22   GLUCOSE 135* 113* 113*  BUN 29* 59* 84*  CREATININE 6.24* 9.08* 10.83*  CALCIUM 9.5 9.2 8.7*  PHOS 7.1* 8.6*  --    CBC: Recent Labs  Lab 07/23/18 1849 07/24/18 0314 07/25/18 0520 07/26/18 0233 07/27/18 0430  WBC 9.6 10.8* 9.0 7.6 9.0  NEUTROABS 7.7  --   --   --   --   HGB 7.4* 8.0* 7.3* 7.2* 6.8*  HCT 21.5* 24.0* 21.9* 21.4* 21.1*  MCV 94.3 96.8 97.3 96.0 100.5*  PLT 122* 130* 124* 117* 125*   Blood Culture    Component Value Date/Time   SDES URINE, RANDOM 02/01/2016 1707   SPECREQUEST NONE 02/01/2016 1707   CULT (A) 02/01/2016 1707    10,000 COLONIES/mL PSEUDOMONAS AERUGINOSA 40,000 COLONIES/mL ENTEROBACTER AEROGENES    REPTSTATUS 02/04/2016 FINAL 02/01/2016 1707     Medications: . sodium chloride    . sodium chloride    . ferric gluconate (FERRLECIT/NULECIT) IV Stopped (07/23/18 1140)  . magnesium sulfate bolus IVPB     . sodium chloride   Intravenous Once  . sodium chloride   Intravenous Once  . aspirin EC  81  mg Oral Daily  . atorvastatin  80 mg Oral Q supper  . [START ON 07/28/2018] calcitRIOL  1.25 mcg Oral Q M,W,F-HD  . [START ON 07/28/2018] cinacalcet  60 mg Oral Q M,W,F-HD  . clopidogrel  75 mg Oral Q supper  . darbepoetin (ARANESP) injection - DIALYSIS  60 mcg Intravenous Q Wed-HD  . docusate sodium  100 mg Oral Daily  . gabapentin  300 mg Oral q morning - 10a  . heparin  5,000 Units Subcutaneous Q8H  . loratadine  10 mg Oral Daily  . pantoprazole  40 mg Oral Daily  . Ensure Max Protein  11 oz Oral TID WC  . sodium chloride flush  3 mL Intravenous Q12H  . sodium chloride flush  3 mL Intravenous Q12H  . sucroferric oxyhydroxide  1,000 mg Oral TID WC  . sucroferric oxyhydroxide  500 mg Oral With snacks  . vitamin B-12  1,000 mcg Oral Daily    Dialysis Orders:  [facility] MWF 4h 400/800 EDW 86kg 2K/2Ca UFP 4 Sensipar 60 TIW Calcitriol 1.25 TIW Mircera 50 q 2 weeks (last 4/29)  Assessment/Plan: 1. ESRD - Usually MWF.  HD done 5/16 off schedule, next for Monday via AVF   2 Hyperkalemia - 2 K bath with next HD. Got [facility] 5/14. K 5.4 pre HD yesterday   3. PAD with critical limb ischemia - left lower extremity  - s/p left common femoral to below knee popliteal artery bypass graft- that seems to be going well  4.  Ischemic cardiomyopathy  - Optimizing volume with HD - UF to EDW as tolerated.  Had uf 1400 yest but BP dropped after- given 500 back    5.  Anemia  - Secondary in part to ESRD as well as acute blood loss from procedure  - on aranesp and nulecit - will inc both doses - Transfuse 1 unit PRBC's on 5/13- down low again- not helping the way he feels - to get another unit today   6.  Secondary hyperparathyroidism, metabolic bone disease  - On velphoro,  Sensipar, calcitriol    7. Elevated troponin- s/p cath with severe single vessel CAD chronic total chronic occlusion of RCA. Medical therapy recommended. No targets for PCI. Also found to have slightly decreased EF from  baseline and moderate to severe AS-  May need intervention.  Seems like issues last 48 hours have been cardiac related    [name], MD 07/27/2018

## 2018-07-27 NOTE — Progress Notes (Signed)
MD Fields notified of patients hgb 6.8 1 unit RBCs ordered

## 2018-07-27 NOTE — Progress Notes (Signed)
[facility]  Subjective  - feels better   Objective (!) 89/63 92 98.1 F (36.7 C) (Oral) 15 100%  Intake/Output Summary (Last 24 hours) at 07/27/2018 0916 Last data filed at 07/27/2018 0900 Gross per 24 hour  Intake 1880.83 ml  Output 1400 ml  Net 480.83 ml   Left leg incisions healing foot warm doppler signals pT DP No dyspnea No chest pain   Eating some  Assessment/Planning: Patent left leg bypass  Hypotension most likely volume related. Will transfuse today due to Hgb less than 7 CAD hypotension  Potentially rehab this week  Keep in ICU today will transfer out if pressed for beds  Aortic stenosis reduced EF CAD cardiology following  Ruta Hinds 07/27/2018 9:16 AM --  Laboratory Lab Results: Recent Labs    07/26/18 0233 07/27/18 0430  WBC 7.6 9.0  HGB 7.2* 6.8*  HCT 21.4* 21.1*  PLT 117* 125*   BMET Recent Labs    07/25/18 0520 07/26/18 0233  NA 134* 133*  K 4.9 5.4*  CL 92* 94*  CO2 25 22  GLUCOSE 113* 113*  BUN 59* 84*  CREATININE 9.08* 10.83*  CALCIUM 9.2 8.7*    COAG Lab Results  Component Value Date   INR 1.2 07/22/2018   INR 1.27 01/23/2016   INR 1.10 (L) 01/21/2015   PROTIME 13.2 01/21/2015   Lab Results  Component Value Date   PTT 48 (H) 10/11/2014

## 2018-07-27 NOTE — Progress Notes (Deleted)
Extubated without issue. Following commands, resting comfortably on nasal cannula.

## 2018-07-28 ENCOUNTER — Inpatient Hospital Stay (HOSPITAL_COMMUNITY)
Admission: RE | Admit: 2018-07-28 | Discharge: 2018-08-13 | DRG: 945 | Disposition: A | Discharge status: Home Health | Payer: Medicare Other | Source: Intra-hospital | Attending: Physical Medicine & Rehabilitation | Admitting: Physical Medicine & Rehabilitation

## 2018-07-28 DIAGNOSIS — I120 Hypertensive chronic kidney disease with stage 5 chronic kidney disease or end stage renal disease: Secondary | ICD-10-CM | POA: Diagnosis not present

## 2018-07-28 DIAGNOSIS — D72829 Elevated white blood cell count, unspecified: Secondary | ICD-10-CM | POA: Diagnosis not present

## 2018-07-28 DIAGNOSIS — D696 Thrombocytopenia, unspecified: Secondary | ICD-10-CM | POA: Diagnosis not present

## 2018-07-28 DIAGNOSIS — I953 Hypotension of hemodialysis: Secondary | ICD-10-CM | POA: Diagnosis not present

## 2018-07-28 DIAGNOSIS — Z8249 Family history of ischemic heart disease and other diseases of the circulatory system: Secondary | ICD-10-CM | POA: Diagnosis not present

## 2018-07-28 DIAGNOSIS — N186 End stage renal disease: Secondary | ICD-10-CM | POA: Diagnosis present

## 2018-07-28 DIAGNOSIS — R5381 Other malaise: Secondary | ICD-10-CM | POA: Diagnosis present

## 2018-07-28 DIAGNOSIS — E875 Hyperkalemia: Secondary | ICD-10-CM | POA: Diagnosis present

## 2018-07-28 DIAGNOSIS — N2581 Secondary hyperparathyroidism of renal origin: Secondary | ICD-10-CM | POA: Diagnosis present

## 2018-07-28 DIAGNOSIS — I5042 Chronic combined systolic (congestive) and diastolic (congestive) heart failure: Secondary | ICD-10-CM | POA: Diagnosis present

## 2018-07-28 DIAGNOSIS — I350 Nonrheumatic aortic (valve) stenosis: Secondary | ICD-10-CM | POA: Diagnosis present

## 2018-07-28 DIAGNOSIS — I2582 Chronic total occlusion of coronary artery: Secondary | ICD-10-CM | POA: Diagnosis present

## 2018-07-28 DIAGNOSIS — G629 Polyneuropathy, unspecified: Secondary | ICD-10-CM | POA: Diagnosis present

## 2018-07-28 DIAGNOSIS — K5900 Constipation, unspecified: Secondary | ICD-10-CM | POA: Diagnosis present

## 2018-07-28 DIAGNOSIS — G2581 Restless legs syndrome: Secondary | ICD-10-CM | POA: Diagnosis present

## 2018-07-28 DIAGNOSIS — I129 Hypertensive chronic kidney disease with stage 1 through stage 4 chronic kidney disease, or unspecified chronic kidney disease: Secondary | ICD-10-CM | POA: Diagnosis not present

## 2018-07-28 DIAGNOSIS — Z87891 Personal history of nicotine dependence: Secondary | ICD-10-CM | POA: Diagnosis not present

## 2018-07-28 DIAGNOSIS — D62 Acute posthemorrhagic anemia: Secondary | ICD-10-CM | POA: Diagnosis present

## 2018-07-28 DIAGNOSIS — D649 Anemia, unspecified: Secondary | ICD-10-CM

## 2018-07-28 DIAGNOSIS — I132 Hypertensive heart and chronic kidney disease with heart failure and with stage 5 chronic kidney disease, or end stage renal disease: Secondary | ICD-10-CM | POA: Diagnosis present

## 2018-07-28 DIAGNOSIS — Z7902 Long term (current) use of antithrombotics/antiplatelets: Secondary | ICD-10-CM

## 2018-07-28 DIAGNOSIS — I9589 Other hypotension: Secondary | ICD-10-CM | POA: Diagnosis present

## 2018-07-28 DIAGNOSIS — E785 Hyperlipidemia, unspecified: Secondary | ICD-10-CM | POA: Diagnosis present

## 2018-07-28 DIAGNOSIS — I739 Peripheral vascular disease, unspecified: Secondary | ICD-10-CM | POA: Diagnosis present

## 2018-07-28 DIAGNOSIS — Z833 Family history of diabetes mellitus: Secondary | ICD-10-CM | POA: Diagnosis not present

## 2018-07-28 DIAGNOSIS — Z992 Dependence on renal dialysis: Secondary | ICD-10-CM | POA: Diagnosis not present

## 2018-07-28 DIAGNOSIS — Z7982 Long term (current) use of aspirin: Secondary | ICD-10-CM

## 2018-07-28 DIAGNOSIS — K5903 Drug induced constipation: Secondary | ICD-10-CM | POA: Diagnosis not present

## 2018-07-28 DIAGNOSIS — Z808 Family history of malignant neoplasm of other organs or systems: Secondary | ICD-10-CM | POA: Diagnosis not present

## 2018-07-28 DIAGNOSIS — D631 Anemia in chronic kidney disease: Secondary | ICD-10-CM | POA: Diagnosis present

## 2018-07-28 DIAGNOSIS — I2510 Atherosclerotic heart disease of native coronary artery without angina pectoris: Secondary | ICD-10-CM | POA: Diagnosis not present

## 2018-07-28 DIAGNOSIS — K219 Gastro-esophageal reflux disease without esophagitis: Secondary | ICD-10-CM | POA: Diagnosis not present

## 2018-07-28 DIAGNOSIS — I252 Old myocardial infarction: Secondary | ICD-10-CM

## 2018-07-28 DIAGNOSIS — I5032 Chronic diastolic (congestive) heart failure: Secondary | ICD-10-CM | POA: Diagnosis not present

## 2018-07-28 DIAGNOSIS — E8581 Light chain (AL) amyloidosis: Secondary | ICD-10-CM | POA: Diagnosis present

## 2018-07-28 DIAGNOSIS — I255 Ischemic cardiomyopathy: Secondary | ICD-10-CM | POA: Diagnosis present

## 2018-07-28 DIAGNOSIS — M898X9 Other specified disorders of bone, unspecified site: Secondary | ICD-10-CM | POA: Diagnosis present

## 2018-07-28 LAB — RENAL FUNCTION PANEL
Albumin: 3 g/dL — ABNORMAL LOW (ref 3.5–5.0)
Anion gap: 19 — ABNORMAL HIGH (ref 5–15)
BUN: 75 mg/dL — ABNORMAL HIGH (ref 8–23)
CO2: 22 mmol/L (ref 22–32)
Calcium: 9.1 mg/dL (ref 8.9–10.3)
Chloride: 93 mmol/L — ABNORMAL LOW (ref 98–111)
Creatinine, Ser: 9.15 mg/dL — ABNORMAL HIGH (ref 0.61–1.24)
GFR calc Af Amer: 6 mL/min — ABNORMAL LOW (ref >60)
GFR calc non Af Amer: 5 mL/min — ABNORMAL LOW (ref >60)
Glucose, Bld: 127 mg/dL — ABNORMAL HIGH (ref 70–99)
Phosphorus: 7.6 mg/dL — ABNORMAL HIGH (ref 2.5–4.6)
Potassium: 5.2 mmol/L — ABNORMAL HIGH (ref 3.5–5.1)
Sodium: 134 mmol/L — ABNORMAL LOW (ref 135–145)

## 2018-07-28 LAB — TYPE AND SCREEN
ABO/RH(D): O POS
Antibody Screen: NEGATIVE
Unit division: 0

## 2018-07-28 LAB — CBC
HCT: 22 % — ABNORMAL LOW (ref 39.0–52.0)
Hemoglobin: 7.1 g/dL — ABNORMAL LOW (ref 13.0–17.0)
MCH: 32.7 pg (ref 26.0–34.0)
MCHC: 32.3 g/dL (ref 30.0–36.0)
MCV: 101.4 fL — ABNORMAL HIGH (ref 80.0–100.0)
Platelets: 115 10*3/uL — ABNORMAL LOW (ref 150–400)
RBC: 2.17 MIL/uL — ABNORMAL LOW (ref 4.22–5.81)
RDW: 15.7 % — ABNORMAL HIGH (ref 11.5–15.5)
WBC: 7.8 10*3/uL (ref 4.0–10.5)
nRBC: 1.2 % — ABNORMAL HIGH (ref 0.0–0.2)

## 2018-07-28 LAB — BPAM RBC
Blood Product Expiration Date: 202006142359
ISSUE DATE / TIME: 202005170801
Unit Type and Rh: 5100

## 2018-07-28 MED ORDER — CALCITRIOL 0.25 MCG PO CAPS
1.2500 ug | ORAL_CAPSULE | ORAL | Status: DC
  Administered 2018-07-30 – 2018-08-13 (×5): 1.25 ug via ORAL
  Filled 2018-07-28 (×2): qty 5

## 2018-07-28 MED ORDER — ACETAMINOPHEN 650 MG RE SUPP: 325.0000 mg | RECTAL | Status: DC | PRN

## 2018-07-28 MED ORDER — HEPARIN SODIUM (PORCINE) 1000 UNIT/ML DIALYSIS
1000.0000 [IU] | INTRAMUSCULAR | Status: DC | PRN
  Administered 2018-07-30: 17:00:00 1000 [IU] via INTRAVENOUS_CENTRAL
  Filled 2018-07-28 (×2): qty 1

## 2018-07-28 MED ORDER — CINACALCET HCL 30 MG PO TABS
60.0000 mg | ORAL_TABLET | ORAL | Status: DC
  Administered 2018-07-30 – 2018-08-13 (×5): 60 mg via ORAL
  Filled 2018-07-28 (×4): qty 2

## 2018-07-28 MED ORDER — LIDOCAINE-PRILOCAINE 2.5-2.5 % EX CREA
1.0000 "application " | TOPICAL_CREAM | CUTANEOUS | Status: DC | PRN
  Filled 2018-07-28: qty 5

## 2018-07-28 MED ORDER — SUCROFERRIC OXYHYDROXIDE 500 MG PO CHEW
1000.0000 mg | CHEWABLE_TABLET | Freq: Three times a day (TID) | ORAL | Status: DC
  Administered 2018-07-29 – 2018-08-12 (×29): 1000 mg via ORAL
  Filled 2018-07-28 (×48): qty 2

## 2018-07-28 MED ORDER — SORBITOL 70 % SOLN
30.0000 mL | Freq: Every day | Status: DC | PRN
  Administered 2018-07-31: 01:00:00 30 mL via ORAL
  Filled 2018-07-28: qty 30

## 2018-07-28 MED ORDER — SODIUM CHLORIDE 0.9 % IV SOLN: 100.0000 mL | INTRAVENOUS | Status: DC | PRN

## 2018-07-28 MED ORDER — ALTEPLASE 2 MG IJ SOLR
2.0000 mg | Freq: Once | INTRAMUSCULAR | Status: DC | PRN
  Filled 2018-07-28: qty 2

## 2018-07-28 MED ORDER — VITAMIN B-12 1000 MCG PO TABS
1000.0000 ug | ORAL_TABLET | Freq: Every day | ORAL | Status: DC
  Administered 2018-07-29 – 2018-08-13 (×16): 1000 ug via ORAL
  Filled 2018-07-28 (×16): qty 1

## 2018-07-28 MED ORDER — HEPARIN SODIUM (PORCINE) 1000 UNIT/ML DIALYSIS: 20.0000 [IU]/kg | INTRAMUSCULAR | Status: DC | PRN

## 2018-07-28 MED ORDER — POLYETHYLENE GLYCOL 3350 17 G PO PACK: 17.0000 g | PACK | Freq: Every day | ORAL | Status: DC | PRN

## 2018-07-28 MED ORDER — CLOPIDOGREL BISULFATE 75 MG PO TABS
75.0000 mg | ORAL_TABLET | Freq: Every day | ORAL | Status: DC
  Administered 2018-07-29 – 2018-08-12 (×15): 75 mg via ORAL
  Filled 2018-07-28 (×16): qty 1

## 2018-07-28 MED ORDER — PANTOPRAZOLE SODIUM 40 MG PO TBEC
40.0000 mg | DELAYED_RELEASE_TABLET | Freq: Every day | ORAL | Status: DC
  Administered 2018-07-29 – 2018-08-13 (×16): 40 mg via ORAL
  Filled 2018-07-28 (×16): qty 1

## 2018-07-28 MED ORDER — ASPIRIN EC 81 MG PO TBEC
81.0000 mg | DELAYED_RELEASE_TABLET | Freq: Every day | ORAL | Status: DC
  Administered 2018-07-29 – 2018-08-13 (×16): 81 mg via ORAL
  Filled 2018-07-28 (×16): qty 1

## 2018-07-28 MED ORDER — DARBEPOETIN ALFA 200 MCG/0.4ML IJ SOSY
200.0000 ug | PREFILLED_SYRINGE | INTRAMUSCULAR | Status: DC
  Administered 2018-07-30 – 2018-08-06 (×2): 200 ug via INTRAVENOUS
  Filled 2018-07-28 (×3): qty 0.4

## 2018-07-28 MED ORDER — GABAPENTIN 300 MG PO CAPS
300.0000 mg | ORAL_CAPSULE | Freq: Every morning | ORAL | Status: DC
  Administered 2018-07-29 – 2018-08-12 (×15): 300 mg via ORAL
  Filled 2018-07-28 (×16): qty 1

## 2018-07-28 MED ORDER — ACETAMINOPHEN 325 MG PO TABS
325.0000 mg | ORAL_TABLET | ORAL | Status: DC | PRN
  Administered 2018-08-07 – 2018-08-10 (×2): 650 mg via ORAL
  Filled 2018-07-28 (×2): qty 2

## 2018-07-28 MED ORDER — PENTAFLUOROPROP-TETRAFLUOROETH EX AERO: 1.0000 "application " | INHALATION_SPRAY | CUTANEOUS | Status: DC | PRN

## 2018-07-28 MED ORDER — LIDOCAINE HCL (PF) 1 % IJ SOLN: 5.0000 mL | INTRAMUSCULAR | Status: DC | PRN

## 2018-07-28 MED ORDER — ATORVASTATIN CALCIUM 80 MG PO TABS
80.0000 mg | ORAL_TABLET | Freq: Every day | ORAL | Status: DC
  Administered 2018-07-29 – 2018-08-12 (×15): 80 mg via ORAL
  Filled 2018-07-28 (×18): qty 1

## 2018-07-28 MED ORDER — DOCUSATE SODIUM 100 MG PO CAPS
100.0000 mg | ORAL_CAPSULE | Freq: Every day | ORAL | Status: DC
  Administered 2018-07-29 – 2018-07-31 (×3): 100 mg via ORAL
  Filled 2018-07-28 (×3): qty 1

## 2018-07-28 MED ORDER — HEPARIN SODIUM (PORCINE) 5000 UNIT/ML IJ SOLN: 5000.0000 [IU] | Freq: Three times a day (TID) | INTRAMUSCULAR | Status: DC

## 2018-07-28 MED ORDER — HEPARIN SODIUM (PORCINE) 5000 UNIT/ML IJ SOLN
5000.0000 [IU] | Freq: Three times a day (TID) | INTRAMUSCULAR | Status: DC
  Administered 2018-07-28 – 2018-08-13 (×38): 5000 [IU] via SUBCUTANEOUS
  Filled 2018-07-28 (×39): qty 1

## 2018-07-28 MED ORDER — HEPARIN SODIUM (PORCINE) 1000 UNIT/ML DIALYSIS
20.0000 [IU]/kg | INTRAMUSCULAR | Status: DC | PRN
  Filled 2018-07-28: qty 2

## 2018-07-28 MED ORDER — OXYCODONE HCL 5 MG PO TABS
5.0000 mg | ORAL_TABLET | Freq: Four times a day (QID) | ORAL | Status: DC | PRN
  Administered 2018-07-28: 21:00:00 5 mg via ORAL
  Administered 2018-07-29 – 2018-08-03 (×5): 10 mg via ORAL
  Administered 2018-08-05: 5 mg via ORAL
  Administered 2018-08-06 – 2018-08-12 (×5): 10 mg via ORAL
  Filled 2018-07-28 (×5): qty 2
  Filled 2018-07-28: qty 1
  Filled 2018-07-28 (×7): qty 2

## 2018-07-28 MED ORDER — LORATADINE 10 MG PO TABS
10.0000 mg | ORAL_TABLET | Freq: Every day | ORAL | Status: DC
  Administered 2018-07-29 – 2018-08-13 (×16): 10 mg via ORAL
  Filled 2018-07-28 (×16): qty 1

## 2018-07-28 MED ORDER — SUCROFERRIC OXYHYDROXIDE 500 MG PO CHEW
500.0000 mg | CHEWABLE_TABLET | ORAL | Status: DC
  Administered 2018-07-28 – 2018-08-01 (×4): 500 mg via ORAL
  Filled 2018-07-28 (×18): qty 1

## 2018-07-28 MED ORDER — SODIUM CHLORIDE 0.9 % IV SOLN
125.0000 mg | INTRAVENOUS | Status: DC
  Administered 2018-07-30 – 2018-08-06 (×2): 125 mg via INTRAVENOUS
  Filled 2018-07-28 (×3): qty 10

## 2018-07-28 NOTE — Progress Notes (Addendum)
Inpatient Rehab Admissions:  Inpatient Rehab Consult received.  I met with patient at the bedside for rehabilitation assessment and to discuss goals and expectations of an inpatient rehab admission.  I attempted to contact his daughter, but was unable to speak with her today.  He states he is 767% certain she will be willing to allow him to stay with her at d/c if needed.    Addendum: I have approval from Dr. Donzetta Matters and Dr. Haroldine Laws for admission today.  I will let pt/family, CM, and RN know.   Signed: Shann Medal, PT, DPT Admissions Coordinator [phone] 07/28/18  2:39 PM

## 2018-07-28 NOTE — Progress Notes (Signed)
Progress Note   Subjective   Feels ok. No CP or SOB. Says he is getting stronger.   Inpatient Medications    Scheduled Meds: . sodium chloride   Intravenous Once  . sodium chloride   Intravenous Once  . aspirin EC  81 mg Oral Daily  . atorvastatin  80 mg Oral Q supper  . calcitRIOL  1.25 mcg Oral Q M,W,F-HD  . Chlorhexidine Gluconate Cloth  6 each Topical Q0600  . cinacalcet  60 mg Oral Q M,W,F-HD  . clopidogrel  75 mg Oral Q supper  . [START ON 07/30/2018] darbepoetin (ARANESP) injection - DIALYSIS  200 mcg Intravenous Q Wed-HD  . docusate sodium  100 mg Oral Daily  . gabapentin  300 mg Oral q morning - 10a  . heparin  5,000 Units Subcutaneous Q8H  . loratadine  10 mg Oral Daily  . pantoprazole  40 mg Oral Daily  . Ensure Max Protein  11 oz Oral TID WC  . sodium chloride flush  3 mL Intravenous Q12H  . sodium chloride flush  3 mL Intravenous Q12H  . sucroferric oxyhydroxide  1,000 mg Oral TID WC  . sucroferric oxyhydroxide  500 mg Oral With snacks  . vitamin B-12  1,000 mcg Oral Daily   Continuous Infusions: . sodium chloride    . sodium chloride    . [START ON 07/30/2018] ferric gluconate (FERRLECIT/NULECIT) IV    . magnesium sulfate bolus IVPB     PRN Meds: sodium chloride, sodium chloride, acetaminophen **OR** acetaminophen, bisacodyl, guaiFENesin-dextromethorphan, heparin, magnesium sulfate bolus IVPB, metoprolol tartrate, ondansetron, oxyCODONE, oxyCODONE, phenol, polyethylene glycol, sodium chloride flush   Vital Signs    Vitals:   07/28/18 1330 07/28/18 1345 07/28/18 1400 07/28/18 1430  BP: (!) 92/50 (!) 89/50 (!) 92/57 (!) 97/58  Pulse: 87 88 90 84  Resp:      Temp:      TempSrc:      SpO2:      Weight:      Height:        Intake/Output Summary (Last 24 hours) at 07/28/2018 1456 Last data filed at 07/28/2018 0700 Gross per 24 hour  Intake 360 ml  Output -  Net 360 ml   Filed Weights   07/26/18 1615 07/28/18 1210 07/28/18 1310  Weight: 89.2 kg  91.8 kg 94.8 kg    Telemetry    Sinus 80-90s - Personally Reviewed  Physical Exam    General:  Sitting in chair. No resp difficulty HEENT: normal Neck: supple. no JVD. Carotids 2+ bilat; + bruits. No lymphadenopathy or thryomegaly appreciated. Cor: PMI nondisplaced. Regular rate & rhythm. 3/6 AS s2 obliterated Lungs: clear Abdomen: soft, nontender, nondistended. No hepatosplenomegaly. No bruits or masses. Good bowel sounds. Extremities: no cyanosis, clubbing, rash. LLE surgical scar healing well with 2+edema.  Neuro: alert & orientedx3, cranial nerves grossly intact. moves all 4 extremities w/o difficulty. Affect pleasant    Labs    Chemistry Recent Labs  Lab 07/22/18 828 300 [id number]  07/24/18 0314 07/25/18 0520 07/26/18 0233 07/28/18 1338  NA 139   < > 137 134* 133* 134*  K 4.0   < > 5.4* 4.9 5.4* 5.2*  CL 89*   < > 93* 92* 94* 93*  CO2 25   < > 28 25 22 22   GLUCOSE 118*   < > 135* 113* 113* 127*  BUN 30*   < > 29* 59* 84* 75*  CREATININE 7.72*   < > 6.24* 9.08* 10.83*  9.15*  CALCIUM 9.1   < > 9.5 9.2 8.7* 9.1  PROT 6.8  --   --   --   --   --   ALBUMIN 3.4*  --  3.6 3.2*  --  3.0*  AST 16  --   --   --   --   --   ALT 14  --   --   --   --   --   ALKPHOS 55  --   --   --   --   --   BILITOT 0.7  --   --   --   --   --   GFRNONAA 7*   < > 9* 5* 4* 5*  GFRAA 8*   < > 10* 6* 5* 6*  ANIONGAP 25*   < > 16* 17* 17* 19*   < > = values in this interval not displayed.     Hematology Recent Labs  Lab 07/26/18 0233 07/27/18 0430 07/28/18 0114  WBC 7.6 9.0 7.8  RBC 2.23* 2.10* 2.17*  HGB 7.2* 6.8* 7.1*  HCT 21.4* 21.1* 22.0*  MCV 96.0 100.5* 101.4*  MCH 32.3 32.4 32.7  MCHC 33.6 32.2 32.3  RDW 15.5 15.6* 15.7*  PLT 117* 125* 115*    Cardiac Enzymes Recent Labs  Lab 07/23/18 1239 07/23/18 1849 07/24/18 0816  TROPONINI 0.09* 5.25* >65.00*   No results for input(s): TROPIPOC in the last 168 hours.     Patient Profile   68 y.o.malewith a hx of ESRD on HD, AL  amyloid, aortic stenosis, HTN, GERD, anemia, combined HF and CADwhowas seen for elevated troponin.  Assessment & Plan    1.  NSTEMI - Likely demand ischemia with chronically occluded RCA with collateral flow on cath 07/24/18.   - stable no current CP - continue DAPT, statin - BP too low currently fo b-blocker  2. Acute on chronic systolic dysfunction/ ischemic CM - EF 35-40% on echo 5/20 (was 40-45% on 1/20) - Volume status managed by HD - Medical therapy is very limited due to ESRD and low BP  3. Severe aortic stenosis - Follows with Dr. Angelena Form. - Consider TAVR as elgible  4. ESRD on HD -Nephrology is following  5. PAD with critical limb ischemia -S/p  left common femoral to below knee popliteal artery bypass graft-  Vascular following with plans for rehab once improved clinically  Ok for CIR from our standpoint  Glori Bickers, MD  3:01 PM

## 2018-07-28 NOTE — PMR Pre-admission (Signed)
PMR Admission Coordinator Pre-Admission Assessment  Patient: Jose Garrett is an 68 y.o., male MRN: [id number] DOB: Dec 14, 1950 Height: '5\' 10"'$  (177.8 cm) Weight: 94.8 kg  Insurance Information HMO:     PPO:      PCP:      IPA:      80/20:      OTHER:  PRIMARY: Medicare A and B      Policy#: [id number]      Subscriber: patient Pre-Cert#: verified via passport one online Eff. Date: A and B 04/12/2016     Deduct: $1408      Out of Pocket Max: n/a      Life Max: n/a CIR: 100%      SNF: 20 full days Outpatient: 100%     Co-Pay:  Home Health: 100%      Co-Pay:  DME: 80%     Co-Pay: 20% Providers: patient choice  SECONDARY:       Policy#:       Subscriber:  CM Name:       Phone#:      Fax#:  Pre-Cert#:       Employer:  Benefits:  Phone #:      Name:  Eff. Date:      Deduct:       Out of Pocket Max:       Life Max:  CIR:       SNF:  Outpatient:      Co-Pay:  Home Health:       Co-Pay:  DME:      Co-Pay:   Medicaid Application Date:       Case Manager:  Disability Application Date:       Case Worker:   The "Data Collection Information Summary" for patients in Inpatient Rehabilitation Facilities with attached "Privacy Act [place] Records" was provided and verbally reviewed with: Patient  Emergency Contact Information Contact Information    Name Relation Home Work [place] Daughter [phone]  [phone]   Jeanene Erb [phone]  [phone]      Current Medical History  Patient Admitting Diagnosis: L fempop bypass   History of Present Illness: Jose Garrett is a 68 y.o. right-handed male with history of end-stage renal disease due to cardiorenal syndrome in setting of AL amyloidosis with hemodialysis, aortic stenosis, hypertension, combined diastolic and systolic congestive heart failure followed by Dr.Mcalhany.  Presented 07/22/2018 with progressive discoloration and ischemia in his toes on the left side.  Underwent noninvasive studies  which detected noncompressible vessels with no flow detected in the left toes and dampened flow in the right toes.  Underwent outpatient PV angiogram showing occlusion of bilateral SFAs but no gangrenous changes on the left side.  It was felt he was a candidate for surgical intervention.  Underwent left common femoral to below-knee popliteal artery bypass 07/22/2018 per Dr. Servando Snare.  Renal service follow-up with hemodialysis ongoing.  Hospital course developed profound hypotension.  EKG showed ST with RBBB suspect non-STEMI.  Cardiology services consulted noted troponin  0.09, hemoglobin 6.8-7.3, potassium 5.9.  He was transfused 1 unit packed red blood cells.  Underwent cardiac catheterization 07/24/2018 showed chronic total occlusion of RCA with L to R collaterals.  Echocardiogram with ejection fraction of 40% moderately increased left ventricular wall thickness with reduced systolic function.  Initially required heparin transition to aspirin and Plavix.  Patient with ongoing bouts of symptomatic hypotension.      Patient's medical record from [facility] has been  reviewed by the rehabilitation admission coordinator and physician.  Past Medical History  Past Medical History:  Diagnosis Date  . Amyloidosis ([place])   . Anemia Dx 2016  . Anxiety    "especially with procedure"- 07/21/2018  . CAD (coronary artery disease)    a. cardiac cath on 11/25/17 with occlusion of the RCA which could have been his event but the RCA could not be engaged. The RCA filled distally from left to right collaterals. There were no severe stenoses in the left coronary system. Medical therapy recommended, started on plavix.  . Chronic combined systolic and diastolic CHF (congestive heart failure) ([place])    a. EF dropped to 35-40% with grade 2 DD by echo 11/2017.  [name] Diarrhea   . ESRD (end stage renal disease) ([place])    ESRD- HEMO MWF- [facility]  . Family history of adverse reaction to anesthesia    "my daughter  can't take certain anesthesia agents" (09/08/2014)  . GERD (gastroesophageal reflux disease)   . Heart murmur   . History of blood transfusion 09/08/2014   "got hematoma after renal biopsy & HgB dropped"  . Hyperlipidemia Dx 2012  . Hypertension Dx 2012  . Myocardial infarction ([place]) 11/2017  . Neuropathy   . Peripheral vascular disease ([place])   . Restless legs     Family History   family history includes Diabetes in his father, mother, and sister; Heart disease in his mother; Hypertension in his father and mother; Skin cancer in his mother.  Prior Rehab/Hospitalizations Has the patient had prior rehab or hospitalizations prior to admission? Yes  Has the patient had major surgery during 100 days prior to admission? Yes   Current Medications  Current Facility-Administered Medications:  .  0.9 %  sodium chloride infusion (Manually program via Guardrails IV Fluids), , Intravenous, Once, Harrie Jeans C, MD .  0.9 %  sodium chloride infusion (Manually program via Guardrails IV Fluids), , Intravenous, Once, [name], MD .  0.9 %  sodium chloride infusion, 500 mL, Intravenous, Once PRN, Rhyne, Samantha J, PA-C .  0.9 %  sodium chloride infusion, 250 mL, Intravenous, PRN, Sherren Mocha, MD .  acetaminophen (TYLENOL) tablet 325-650 mg, 325-650 mg, Oral, Q4H PRN **OR** acetaminophen (TYLENOL) suppository 325-650 mg, 325-650 mg, Rectal, Q4H PRN, Rhyne, Samantha J, PA-C .  aspirin EC tablet 81 mg, 81 mg, Oral, Daily, Rhyne, Samantha J, PA-C, 81 mg at 07/28/18 0959 .  atorvastatin (LIPITOR) tablet 80 mg, 80 mg, Oral, Q supper, Rhyne, Hulen Shouts, PA-C, 80 mg at 07/27/18 2115 .  bisacodyl (DULCOLAX) suppository 10 mg, 10 mg, Rectal, Daily PRN, Rhyne, Samantha J, PA-C .  calcitRIOL (ROCALTROL) capsule 1.25 mcg, 1.25 mcg, Oral, Q M,W,F-HD, [name], MD, 1.25 mcg at 07/28/18 1220 .  Chlorhexidine Gluconate Cloth 2 % PADS 6 each, 6 each, Topical, Q0600, [name], MD,  6 each at 07/27/18 0827 .  cinacalcet (SENSIPAR) tablet 60 mg, 60 mg, Oral, Q M,W,F-HD, [name], MD, 60 mg at 07/28/18 1221 .  clopidogrel (PLAVIX) tablet 75 mg, 75 mg, Oral, Q supper, Rhyne, Samantha J, PA-C, 75 mg at 07/27/18 1644 .  [START ON 07/30/2018] Darbepoetin Alfa (ARANESP) injection 200 mcg, 200 mcg, Intravenous, Q Wed-HD, [name], MD .  docusate sodium (COLACE) capsule 100 mg, 100 mg, Oral, Daily, Rhyne, Samantha J, PA-C, 100 mg at 07/28/18 0959 .  [START ON 07/30/2018] ferric gluconate (NULECIT) 125 mg in sodium chloride 0.9 % 100 mL IVPB, 125 mg, Intravenous, Q Wed-HD,  [name], MD .  gabapentin (NEURONTIN) capsule 300 mg, 300 mg, Oral, q morning - 10a, Rhyne, Samantha J, PA-C, 300 mg at 07/28/18 1000 .  guaiFENesin-dextromethorphan (ROBITUSSIN DM) 100-10 MG/5ML syrup 15 mL, 15 mL, Oral, Q4H PRN, Rhyne, Samantha J, PA-C .  heparin injection 1,800 Units, 20 Units/kg, Dialysis, PRN, [name], MD .  heparin injection 5,000 Units, 5,000 Units, Subcutaneous, Q8H, Sherren Mocha, MD, 5,000 Units at 07/28/18 0559 .  loratadine (CLARITIN) tablet 10 mg, 10 mg, Oral, Daily, Rhyne, Samantha J, PA-C, 10 mg at 07/28/18 0959 .  magnesium sulfate IVPB 2 g 50 mL, 2 g, Intravenous, Daily PRN, Rhyne, Samantha J, PA-C .  metoprolol tartrate (LOPRESSOR) injection 2-5 mg, 2-5 mg, Intravenous, Q2H PRN, Rhyne, Samantha J, PA-C .  ondansetron (ZOFRAN) injection 4 mg, 4 mg, Intravenous, Q6H PRN, Rhyne, Samantha J, PA-C, 4 mg at 07/26/18 1101 .  oxyCODONE (Oxy IR/ROXICODONE) immediate release tablet 5-10 mg, 5-10 mg, Oral, Q6H PRN, Rhyne, Samantha J, PA-C, 5 mg at 07/25/18 1413 .  oxyCODONE (OXYCONTIN) 12 hr tablet 10 mg, 10 mg, Oral, Daily PRN, Rhyne, Samantha J, PA-C .  pantoprazole (PROTONIX) EC tablet 40 mg, 40 mg, Oral, Daily, Rhyne, Samantha J, PA-C, 40 mg at 07/28/18 1000 .  phenol (CHLORASEPTIC) mouth spray 1 spray, 1 spray, Mouth/Throat, PRN, Rhyne,  Samantha J, PA-C .  polyethylene glycol (MIRALAX / GLYCOLAX) packet 17 g, 17 g, Oral, Daily PRN, Rhyne, Samantha J, PA-C .  protein supplement (ENSURE MAX) liquid, 11 oz, Oral, TID WC, Waynetta Sandy, MD, 11 oz at 07/28/18 1219 .  sodium chloride flush (NS) 0.9 % injection 3 mL, 3 mL, Intravenous, Q12H, Reino Bellis B, NP, 3 mL at 07/27/18 0827 .  sodium chloride flush (NS) 0.9 % injection 3 mL, 3 mL, Intravenous, Q12H, Sherren Mocha, MD, 3 mL at 07/28/18 1002 .  sodium chloride flush (NS) 0.9 % injection 3 mL, 3 mL, Intravenous, PRN, Sherren Mocha, MD .  sucroferric oxyhydroxide [facility]) chewable tablet 1,000 mg, 1,000 mg, Oral, TID WC, Rhyne, Samantha J, PA-C, 1,000 mg at 07/28/18 1221 .  sucroferric oxyhydroxide (VELPHORO) chewable tablet 500 mg, 500 mg, Oral, With snacks, Waynetta Sandy, MD, 500 mg at 07/28/18 0959 .  vitamin B-12 (CYANOCOBALAMIN) tablet 1,000 mcg, 1,000 mcg, Oral, Daily, Rhyne, Samantha J, PA-C, 1,000 mcg at 07/28/18 3151  Patients Current Diet:  Diet Order            Diet Carb Modified Fluid consistency: Thin; Room service appropriate? Yes  Diet effective now              Precautions / Restrictions Precautions Precautions: Fall Restrictions Weight Bearing Restrictions: No   Has the patient had 2 or more falls or a fall with injury in the past year? No  Prior Activity Level Community (5-7x/wk): went out daily, still driving, using SPC  Prior Functional Level Self Care: Did the patient need help bathing, dressing, using the toilet or eating? Independent  Indoor Mobility: Did the patient need assistance with walking from room to room (with or without device)? Independent  Stairs: Did the patient need assistance with internal or external stairs (with or without device)? Independent  Functional Cognition: Did the patient need help planning regular tasks such as shopping or remembering to take medications? Independent  Home  Assistive Devices / Equipment Home Assistive Devices/Equipment: Eyvonne Mechanic (specify quad or straight) Home Equipment: Cane - single point  Prior Device Use: Indicate devices/aids used by the patient  prior to current illness, exacerbation or injury? single point cane  Current Functional Level Cognition  Overall Cognitive Status: Within Functional Limits for tasks assessed Orientation Level: Oriented X4    Extremity Assessment (includes Sensation/Coordination)  Upper Extremity Assessment: Generalized weakness(HD fistula in R UE)  Lower Extremity Assessment: Defer to PT evaluation LLE Deficits / Details: Grossly decr AROM and strength, limited by pain postop; noted active quad contraction present, but weak; assist to lift LLE against gravity LLE: Unable to fully assess due to pain    ADLs  Overall ADL's : Needs assistance/impaired Eating/Feeding: Independent, Sitting Grooming: Oral care, Sitting, Set up Upper Body Bathing: Minimal assistance, Sitting Lower Body Bathing: Total assistance, Sit to/from stand Upper Body Dressing : Minimal assistance, Sitting Lower Body Dressing: Total assistance, Sit to/from stand    Mobility  Overal bed mobility: Needs Assistance Bed Mobility: Sit to Supine Supine to sit: Mod assist Sit to supine: Mod assist General bed mobility comments: modA with LE elevation back into bed,     Transfers  Overall transfer level: Needs assistance Equipment used: Rolling walker (2 wheeled) Transfers: Sit to/from Stand Sit to Stand: Min assist General transfer comment: minA to power up    Ambulation / Gait / Stairs / Wheelchair Mobility  Ambulation/Gait Ambulation/Gait assistance: Herbalist (Feet): 60 Feet Assistive device: Rolling walker (2 wheeled) Gait Pattern/deviations: Step-to pattern, Trunk flexed, Antalgic, Decreased weight shift to left General Gait Details: minA for walker management, verbal cues to increased step length, pt very  dependent on UEs due ot L LE pain, pt did report it improved with amb duration. Pt c/o taxing on his breathing Gait velocity: slow Gait velocity interpretation: <1.8 ft/sec, indicate of risk for recurrent falls    Posture / Balance Balance Overall balance assessment: Needs assistance Sitting-balance support: No upper extremity supported, Feet supported Sitting balance-Leahy Scale: Fair Standing balance support: Bilateral upper extremity supported Standing balance-Leahy Scale: Poor Standing balance comment: requires UE support to maintain balance    Special needs/care consideration BiPAP/CPAP no CPM no Continuous Drip IV no Dialysis yes        Days MWF Life Vest no Oxygen no Special Bed no Trach Size no Wound Vac (area) no      Location n/a Skin incisions to L groin and LLE                             Bowel mgmt: continent, last BM 07/27/2018 Bladder mgmt: anuric Diabetic mgmt: no Behavioral consideration no Chemo/radiation no   Previous Home Environment (from acute therapy documentation) Living Arrangements: Alone Available Help at Discharge: Family, Available PRN/intermittently Type of Home: Apartment Home Layout: One level Home Access: Stairs to enter Entrance Stairs-Rails: Right Entrance Stairs-Number of Steps: 3 Bathroom Shower/Tub: Chiropodist: Standard Home Care Services: No Additional Comments: may go to his daughter's home  Discharge Living Setting Plans for Discharge Living Setting: House, Other (Comment)(home with daughter) Type of Home at Discharge: House Discharge Home Layout: One level Discharge Home Access: Level entry Discharge Bathroom Shower/Tub: Tub/shower unit Discharge Bathroom Toilet: Standard Discharge Bathroom Accessibility: Yes How Accessible: Accessible via walker Does the patient have any problems obtaining your medications?: No  Social/Family/Support Systems Anticipated Caregiver: daughter, [name] Anticipated  Caregiver's Contact Information: [phone] Ability/Limitations of Caregiver: daughter works at a grocery store 4A-1P, states his exwife could probably step in during the day if he needed 24/7 supervision Caregiver Availability: Evenings only Discharge  Plan Discussed with Primary Caregiver: Yes Is Caregiver In Agreement with Plan?: Yes Does Caregiver/Family have Issues with Lodging/Transportation while Pt is in Rehab?: No  Goals/Additional Needs Patient/Family Goal for Rehab: PT/OT mod I Expected length of stay: 7-10 days Cultural Considerations: n/a Dietary Needs: carb modified, thin Equipment Needs: tbd Special Service Needs: HD MWF Pt/Family Agrees to Admission and willing to participate: Yes Program Orientation Provided & Reviewed with Pt/Caregiver Including Roles  & Responsibilities: Yes   Possible need for SNF placement upon discharge: not anticipated  Patient Condition: I have reviewed medical records from [facility], spoken with CSW, and patient and daughter. I met with patient at the bedside and discussed with daughter via phone for inpatient rehabilitation assessment.  Patient will benefit from ongoing PT and OT, can actively participate in 3 hours of therapy a day 5 days of the week, and can make measurable gains during the admission.  Patient will also benefit from the coordinated team approach during an Inpatient Acute Rehabilitation admission.  The patient will receive intensive therapy as well as Rehabilitation physician, nursing, social worker, and care management interventions.  Due to safety, skin/wound care, disease management, medication administration, pain management and patient education the patient requires 24 hour a day rehabilitation nursing.  The patient is currently min with mobility and basic ADLs.  Discharge setting and therapy post discharge at home with home health is anticipated.  Patient has agreed to participate in the Acute Inpatient Rehabilitation  Program and will admit today.  Preadmission Screen Completed By:  Michel Santee, PT, DPT 07/28/2018 2:50 PM ______________________________________________________________________   Discussed status with Dr. Naaman Plummer on 07/28/18  at 2:50 PM  and received approval for admission today.  Admission Coordinator:  Michel Santee, PT, DPT 2:50 PM Sudie Grumbling 07/28/18    Assessment/Plan: Diagnosis: PVD with LE ischemia, s/p LLE fem-pop  bpg 1. Does the need for close, 24 hr/day Medical supervision in concert with the patient's rehab needs make it unreasonable for this patient to be served in a less intensive setting? Yes 2. Co-Morbidities requiring supervision/potential complications: ESRD, HTN, RBBB, NSTEMI/CAD, CHF 3. Due to bladder management, bowel management, safety, skin/wound care, disease management, medication administration, pain management and patient education, does the patient require 24 hr/day rehab nursing? Yes 4. Does the patient require coordinated care of a physician, rehab nurse, PT (1-2 hrs/day, 5 days/week) and OT (1-2 hrs/day, 5 days/week) to address physical and functional deficits in the context of the above medical diagnosis(es)? Yes Addressing deficits in the following areas: balance, endurance, locomotion, strength, transferring, bowel/bladder control, bathing, dressing, feeding, grooming, toileting and psychosocial support 5. Can the patient actively participate in an intensive therapy program of at least 3 hrs of therapy 5 days a week? Yes 6. The potential for patient to make measurable gains while on inpatient rehab is excellent 7. Anticipated functional outcomes upon discharge from inpatients are: modified independent PT, modified independent OT, n/a SLP 8. Estimated rehab length of stay to reach the above functional goals is: 7-10 days 9. Anticipated D/C setting: Home 10. Anticipated post D/C treatments: [place] therapy 11. Overall Rehab/Functional Prognosis: excellent  MD  Signature: Meredith Staggers, MD, [facility] 07/28/2018

## 2018-07-28 NOTE — H&P (Signed)
Physical Medicine and Rehabilitation Admission H&P    Chief complaint: Weakness HPI: Jose Garrett is a 68 year old right-handed male with history of remote tobacco abuse 7 years ago.  End-stage renal disease due to cardiorenal syndrome in the setting of AL amyloidosis with hemodialysis, aortic stenosis, hypertension, combined diastolic and systolic congestive heart failure followed by Dr. Angelena Form.  Per chart review he lives alone used a cane for ambulation.  1 level home 3 steps to entry.  His daughter plans to stay with him on discharge and assist as needed.  Presented 07/22/2018 with progressive discoloration and ischemia in his toes on the left side.  Underwent noninvasive studies which detected noncompressible vessels with no flow detected in the left toes and dampened flow in the right toes.  Underwent outpatient PV angiogram showing occlusion of bilateral SFAs but no gangrenous changes on the left side.  He was felt to be a candidate for surgical intervention.  Underwent left common femoral to below-knee popliteal artery bypass 07/22/2018 per Dr. Servando Snare.  Renal service follow-up for hemodialysis ongoing.  Hospital course with hypotension.  EKG showed ST with RBBB suspect non-STEMI.  Cardiology services consulted noted troponin  0.09, hemoglobin 6.8-7.3, potassium 5.9.  He was transfused 1 unit packed red blood cells.  Underwent cardiac catheterization 07/24/2018 showed chronic total occlusion of RCA with left to right collaterals.  Echocardiogram with ejection fraction of 40% moderately increased left ventricular wall thickness with reduced systolic function.  Initially required IV heparin transition to aspirin and Plavix.  Close monitoring for asymptomatic hypotension.  Subcutaneous heparin was later initiated for DVT prophylaxis.  Therapy evaluations completed and patient was admitted for a comprehensive rehab program.  Review of Systems  Constitutional: Negative for chills and fever.   HENT: Negative for hearing loss.   Eyes: Negative for blurred vision and double vision.  Respiratory: Positive for shortness of breath.   Cardiovascular: Positive for chest pain, palpitations and leg swelling.  Gastrointestinal: Positive for constipation. Negative for heartburn, nausea and vomiting.  Genitourinary: Negative for dysuria, flank pain and hematuria.  Musculoskeletal: Negative for joint pain and myalgias.  Skin: Negative for rash.  Psychiatric/Behavioral: The patient has insomnia.        Anxiety  All other systems reviewed and are negative.  Past Medical History:  Diagnosis Date  . Amyloidosis ([place])   . Anemia Dx 2016  . Anxiety    "especially with procedure"- 07/21/2018  . CAD (coronary artery disease)    a. cardiac cath on 11/25/17 with occlusion of the RCA which could have been his event but the RCA could not be engaged. The RCA filled distally from left to right collaterals. There were no severe stenoses in the left coronary system. Medical therapy recommended, started on plavix.  . Chronic combined systolic and diastolic CHF (congestive heart failure) ([place])    a. EF dropped to 35-40% with grade 2 DD by echo 11/2017.  [name] Diarrhea   . ESRD (end stage renal disease) ([place])    ESRD- HEMO MWF- [facility]  . Family history of adverse reaction to anesthesia    "my daughter can't take certain anesthesia agents" (09/08/2014)  . GERD (gastroesophageal reflux disease)   . Heart murmur   . History of blood transfusion 09/08/2014   "got hematoma after renal biopsy & HgB dropped"  . Hyperlipidemia Dx 2012  . Hypertension Dx 2012  . Myocardial infarction ([place]) 11/2017  . Neuropathy   . Peripheral vascular disease ([place])   .  Restless legs    Past Surgical History:  Procedure Laterality Date  . ABDOMINAL AORTOGRAM W/LOWER EXTREMITY Bilateral 07/16/2018   Procedure: ABDOMINAL AORTOGRAM W/LOWER EXTREMITY;  Surgeon: Wellington Hampshire, MD;  Location: [facility];  Service:  Cardiovascular;  Laterality: Bilateral;  . AV FISTULA PLACEMENT Right 12/27/2015   Procedure: Right Arm ARTERIOVENOUS (AV) FISTULA CREATION;  Surgeon: Angelia Mould, MD;  Location: [facility];  Service: Vascular;  Laterality: Right;  . COLONOSCOPY    . FEMORAL-POPLITEAL BYPASS GRAFT Left 07/22/2018   Procedure: left FEMORAL TO BELOW KNEE POPLITEAL ARTERY BYPASS GREAFT using 15mm removable ring propaten gore graft;  Surgeon: Waynetta Sandy, MD;  Location: [facility];  Service: Vascular;  Laterality: Left;  . IR GENERIC HISTORICAL  01/27/2016   IR FLUORO GUIDE CV LINE RIGHT 01/27/2016 Arne Cleveland, MD [facility]  . IR GENERIC HISTORICAL  01/27/2016   IR US GUIDE VASC ACCESS RIGHT 01/27/2016 Arne Cleveland, MD [facility]  . LAPAROSCOPIC CHOLECYSTECTOMY  03/2011  . LEFT HEART CATH AND CORONARY ANGIOGRAPHY N/A 11/25/2017   Procedure: LEFT HEART CATH AND CORONARY ANGIOGRAPHY;  Surgeon: Martinique, Peter M, MD;  Location: [facility];  Service: Cardiovascular;  Laterality: N/A;  . LEFT HEART CATH AND CORONARY ANGIOGRAPHY N/A 07/24/2018   Procedure: LEFT HEART CATH AND CORONARY ANGIOGRAPHY;  Surgeon: Sherren Mocha, MD;  Location: [facility];  Service: Cardiovascular;  Laterality: N/A;  . RENAL BIOPSY, PERCUTANEOUS Right 09/08/2014  . RIGHT/LEFT HEART CATH AND CORONARY ANGIOGRAPHY N/A 03/25/2018   Procedure: RIGHT/LEFT HEART CATH AND CORONARY ANGIOGRAPHY;  Surgeon: Martinique, Peter M, MD;  Location: [facility];  Service: Cardiovascular;  Laterality: N/A;  . TONSILLECTOMY  ~ 1960  . ULTRASOUND GUIDANCE FOR VASCULAR ACCESS  03/25/2018   Procedure: Ultrasound Guidance For Vascular Access;  Surgeon: Martinique, Peter M, MD;  Location: [facility];  Service: Cardiovascular;;   Family History  Problem Relation Age of Onset  . Hypertension Mother   . Diabetes Mother   . Heart disease Mother   . Skin cancer Mother   . Hypertension Father   . Diabetes Father   . Diabetes  Sister    Social History:  reports that he quit smoking about 7 years ago. His smoking use included cigarettes. He has a 80.00 pack-year smoking history. He has never used smokeless tobacco. He reports that he does not drink alcohol or use drugs. Allergies: No Known Allergies Medications Prior to Admission  Medication Sig Dispense Refill  . acetaminophen (TYLENOL) 500 MG tablet Take 500-1,000 mg by mouth daily as needed (pain).     [name] aspirin EC 81 MG tablet Take 1 tablet (81 mg total) by mouth daily. 30 tablet 11  . atorvastatin (LIPITOR) 80 MG tablet Take 80 mg by mouth daily with supper.     . cetirizine (ZYRTEC) 10 MG tablet Take 10 mg by mouth daily.    . clopidogrel (PLAVIX) 75 MG tablet Take 75 mg by mouth daily with supper.    . ethyl chloride spray Apply 1 application topically See admin instructions. Spray on arm immediately prior to dialysis on Monday, Wednesday and Friday    . gabapentin (NEURONTIN) 300 MG capsule TAKE 1 CAPSULE BY MOUTH EVERY MORNING AND 3 CAPSULES EACH NIGHT AT BEDTIME (Patient taking differently: Take 300-900 mg by mouth See admin instructions. Take 1 capsule (300 mg) by mouth in the morning & take 3 capsules (900 mg) by mouth at bedtime) 120 capsule  11  . oxyCODONE (OXY IR/ROXICODONE) 5 MG immediate release tablet Take 1-2 tablets (5-10 mg total) by mouth every 6 (six) hours as needed for severe pain. (Patient taking differently: Take 5 mg by mouth daily as needed for moderate pain. ) 60 tablet 0  . oxyCODONE (OXYCONTIN) 10 mg 12 hr tablet Take 10 mg by mouth daily as needed (severe pain).     . sucroferric oxyhydroxide (VELPHORO) 500 MG chewable tablet Chew 500-1,500 mg by mouth See admin instructions. Take 1 tablet (500 mg) by mouth with a small meal or snack & take 2 or 3 tablets (1000 - 1500 mg) by mouth with large meals    . vitamin B-12 (CYANOCOBALAMIN) 1000 MCG tablet Take 1,000 mcg by mouth daily.      Drug Regimen Review Drug regimen was reviewed and  remains appropriate with no significant issues identified  Home: Home Living Family/patient expects to be discharged to:: Private residence Living Arrangements: Alone Available Help at Discharge: Family, Available PRN/intermittently Type of Home: Apartment Home Access: Stairs to enter Technical brewer of Steps: 3 Entrance Stairs-Rails: Right Home Layout: One level Bathroom Shower/Tub: Chiropodist: Standard Home Equipment: Cane - single point Additional Comments: may go to his daughter's home   Functional History: Prior Function Level of Independence: Independent with assistive device(s) Comments: Consistently uses cane to walk  Functional Status:  Mobility: Bed Mobility Overal bed mobility: Needs Assistance Bed Mobility: Sit to Supine Supine to sit: Mod assist Sit to supine: Mod assist General bed mobility comments: modA with LE elevation back into bed,  Transfers Overall transfer level: Needs assistance Equipment used: Rolling walker (2 wheeled) Transfers: Sit to/from Stand Sit to Stand: Min assist General transfer comment: minA to power up Ambulation/Gait Ambulation/Gait assistance: Min assist Gait Distance (Feet): 60 Feet Assistive device: Rolling walker (2 wheeled) Gait Pattern/deviations: Step-to pattern, Trunk flexed, Antalgic, Decreased weight shift to left General Gait Details: minA for walker management, verbal cues to increased step length, pt very dependent on UEs due ot L LE pain, pt did report it improved with amb duration. Pt c/o taxing on his breathing Gait velocity: slow Gait velocity interpretation: <1.8 ft/sec, indicate of risk for recurrent falls    ADL: ADL Overall ADL's : Needs assistance/impaired Eating/Feeding: Independent, Sitting Grooming: Oral care, Sitting, Set up Upper Body Bathing: Minimal assistance, Sitting Lower Body Bathing: Total assistance, Sit to/from stand Upper Body Dressing : Minimal assistance,  Sitting Lower Body Dressing: Total assistance, Sit to/from stand  Cognition: Cognition Overall Cognitive Status: Within Functional Limits for tasks assessed Orientation Level: Oriented X4 Cognition Arousal/Alertness: Awake/alert Behavior During Therapy: WFL for tasks assessed/performed Overall Cognitive Status: Within Functional Limits for tasks assessed  Physical Exam: Blood pressure 93/60, pulse 87, temperature 98.4 F (36.9 C), temperature source Oral, resp. rate (!) 7, height 5\' 10"  (1.778 m), weight 89.2 kg, SpO2 100 %. Physical Exam  Constitutional: No distress.  HENT:  Head: Normocephalic and atraumatic.  Eyes: Pupils are equal, round, and reactive to light. EOM are normal.  Neck: Normal range of motion. No thyromegaly present.  Cardiovascular: Normal rate and regular rhythm. Exam reveals no friction rub.  No murmur heard. Respiratory: Effort normal. No respiratory distress. He has no wheezes.  GI: Soft. He exhibits no distension.  Musculoskeletal:        General: No edema (LLE 2+, RLE trace to 1+).  Neurological:  Patient is alert pleasant sitting up in bed in no acute distress.  Follows commands.  Oriented  to person, place and time.  Skin: Skin is warm. He is not diaphoretic.  Femoropopliteal bypass site clean and dry, left foot dusky, necrotic tissue on plantar aspect of toes 3-5, areas of ischemia along toes an foot. RLE with areas of ischemia in foot to a lesser extent.   Psychiatric: He has a normal mood and affect. His behavior is normal. Judgment and thought content normal.    Results for orders placed or performed during the hospital encounter of 07/22/18 (from the past 48 hour(s))  CBC     Status: Abnormal   Collection Time: 07/27/18  4:30 AM  Result Value Ref Range   WBC 9.0 4.0 - 10.5 K/uL   RBC 2.10 (L) 4.22 - 5.81 MIL/uL   Hemoglobin 6.8 (LL) 13.0 - 17.0 g/dL    Comment: REPEATED TO VERIFY THIS CRITICAL RESULT HAS VERIFIED AND BEEN CALLED TO RN RAMON  MEVA BY MESSAN HOUEGNIFIO ON 05 17 2020 AT 0625, AND HAS BEEN READ BACK.     HCT 21.1 (L) 39.0 - 52.0 %   MCV 100.5 (H) 80.0 - 100.0 fL   MCH 32.4 26.0 - 34.0 pg   MCHC 32.2 30.0 - 36.0 g/dL   RDW 15.6 (H) 11.5 - 15.5 %   Platelets 125 (L) 150 - 400 K/uL   nRBC 0.7 (H) 0.0 - 0.2 %    Comment: Performed at [facility] [address] [place], [place] [address]  Prepare RBC     Status: None   Collection Time: 07/27/18  7:37 AM  Result Value Ref Range   Order Confirmation      ORDER PROCESSED BY BLOOD BANK Performed at [facility], [place] [address], [place], [place] [address]   CBC     Status: Abnormal   Collection Time: 07/28/18  1:14 AM  Result Value Ref Range   WBC 7.8 4.0 - 10.5 K/uL   RBC 2.17 (L) 4.22 - 5.81 MIL/uL   Hemoglobin 7.1 (L) 13.0 - 17.0 g/dL    Comment: REPEATED TO VERIFY POST TRANSFUSION SPECIMEN    HCT 22.0 (L) 39.0 - 52.0 %   MCV 101.4 (H) 80.0 - 100.0 fL   MCH 32.7 26.0 - 34.0 pg   MCHC 32.3 30.0 - 36.0 g/dL   RDW 15.7 (H) 11.5 - 15.5 %   Platelets 115 (L) 150 - 400 K/uL    Comment: REPEATED TO VERIFY PLATELET COUNT CONFIRMED BY SMEAR SPECIMEN CHECKED FOR CLOTS Immature Platelet Fraction may be clinically indicated, consider ordering this additional test [id number]    nRBC 1.2 (H) 0.0 - 0.2 %    Comment: Performed at [facility], [place] [address] [place], [place] [address]   No results found.     Medical Problem List and Plan: 1.  Debility secondary to PAD status post left common femoral to below-knee popliteal artery bypass 0/32/1224 complicated by non-STEMI  -admit to inpatient rehab 2.  Antithrombotics: -DVT/anticoagulation: Subcutaneous heparin  -antiplatelet therapy: Aspirin 81 mg daily, Plavix 75 mg daily 3. Pain Management: Neurontin 300 mg every evening, oxycodone as needed 4. Mood: Provide emotional support  -antipsychotic agents: N/A 5. Neuropsych: This patient is capable of making decisions on his own behalf. 6.  Skin/Wound Care: Routine skin checks. Local skin care, protection to necrotic areas along toes of left foot. All toes appear viable.  7. Fluids/Electrolytes/Nutrition: Routine in and outs with follow-up chemistries 8.  Acute blood loss anemia.  Follow-up CBC.  Continue Aranesp  9.  Hypotension/aortic stenosis.  Monitor with increased mobility 10.  End-stage renal disease.  Continue hemodialysis as directed.  -daily weights 11.  Hyperlipidemia.  Lipitor 12.  Chronic combined diastolic systolic congestive heart failure.  Monitor for any signs of fluid overload  -daily weights 13.  Constipation.  Laxative assistance      Cathlyn Parsons, PA-C 07/28/2018

## 2018-07-28 NOTE — Progress Notes (Addendum)
Progress Note    07/28/2018 7:47 AM 4 Days Post-Op  Subjective:  Says he's feeling better.  Eating a good breakfast (cleaned his plate).  Says he has been up in the room to the bathroom and around the room.  Afebrile HR 80's-90's NSR 65'K-81'E systolic 751% RA  Vitals:   07/28/18 0400 07/28/18 0500  BP: (!) 86/64 94/66  Pulse: 81 88  Resp: 17 (!) 21  Temp: 98.1 F (36.7 C)   SpO2: 96% 100%    Physical Exam: Cardiac:  regular Lungs:  Non labored Incisions:  Left groin and left below knee incisions are healing nicely Extremities:  + doppler signals left DP/PT/peroneal   CBC    Component Value Date/Time   WBC 7.8 07/28/2018 0114   RBC 2.17 (L) 07/28/2018 0114   HGB 7.1 (L) 07/28/2018 0114   HGB 9.5 (L) 07/14/2018 0909   HGB 10.8 (L) 03/14/2017 0921   HCT 22.0 (L) 07/28/2018 0114   HCT 29.3 (L) 07/14/2018 0909   HCT 32.5 (L) 03/14/2017 0921   PLT 115 (L) 07/28/2018 0114   PLT 115 (L) 07/14/2018 0909   MCV 101.4 (H) 07/28/2018 0114   MCV 100 (H) 07/14/2018 0909   MCV 106.2 (H) 03/14/2017 0921   MCH 32.7 07/28/2018 0114   MCHC 32.3 07/28/2018 0114   RDW 15.7 (H) 07/28/2018 0114   RDW 15.6 (H) 07/14/2018 0909   RDW 14.6 03/14/2017 0921   LYMPHSABS 1.0 07/23/2018 1849   LYMPHSABS 0.8 (L) 03/14/2017 0921   MONOABS 0.8 07/23/2018 1849   MONOABS 0.6 03/14/2017 0921   EOSABS 0.0 07/23/2018 1849   EOSABS 0.1 03/14/2017 0921   BASOSABS 0.0 07/23/2018 1849   BASOSABS 0.0 03/14/2017 0921    BMET    Component Value Date/Time   NA 133 (L) 07/26/2018 0233   NA 135 07/14/2018 0909   NA 141 03/14/2017 0921   K 5.4 (H) 07/26/2018 0233   K 4.1 03/14/2017 0921   CL 94 (L) 07/26/2018 0233   CO2 22 07/26/2018 0233   CO2 33 (H) 03/14/2017 0921   GLUCOSE 113 (H) 07/26/2018 0233   GLUCOSE 144 (H) 03/14/2017 0921   BUN 84 (H) 07/26/2018 0233   BUN 76 (HH) 07/14/2018 0909   BUN 30.1 (H) 03/14/2017 0921   CREATININE 10.83 (H) 07/26/2018 0233   CREATININE 9.16 (HH)  09/17/2017 0954   CREATININE 8.1 (HH) 03/14/2017 0921   CALCIUM 8.7 (L) 07/26/2018 0233   CALCIUM 9.5 03/14/2017 0921   GFRNONAA 4 (L) 07/26/2018 0233   GFRNONAA 5 (L) 09/17/2017 0954   GFRNONAA 24 (L) 06/30/2014 1528   GFRAA 5 (L) 07/26/2018 0233   GFRAA 6 (L) 09/17/2017 0954   GFRAA 28 (L) 06/30/2014 1528    INR    Component Value Date/Time   INR 1.2 07/22/2018 0623   INR 1.10 (L) 01/21/2015 0809     Intake/Output Summary (Last 24 hours) at 07/28/2018 0747 Last data filed at 07/27/2018 1600 Gross per 24 hour  Intake 1380 ml  Output -  Net 1380 ml     Assessment:  68 y.o. Garrett is s/p:  1.  Left common femoral to below-knee popliteal artery bypass graft with 6 mm ringed PTFE 2.  Harvest of left below-knee greater saphenous vein  4 Days Post-Op  Plan: -left leg bypass is patent with +doppler signals left DP/PT/peroneal -incisions healing nicely -has mobilized in room -acute blood loss anemia improved to 7.1 with one unit PRBC's.  Pt asymptomatic at  this time.  BP still soft with systolic in the 40'R.  -DVT prophylaxis:  Sq heparin -continue to mobilize as tolerated.   Leontine Locket, PA-C [facility] [phone] 07/28/2018 7:47 AM  I have independently interviewed and examined patient and agree with PA assessment and plan above. Healing well. Appears to be suitable for rehab when ok from cardiology standpoint.   Koray Soter C. Donzetta Matters, MD [facility] [place] Office: [phone] Pager: [phone]

## 2018-07-28 NOTE — Progress Notes (Signed)
Jose Staggers, MD  Physician  Physical Medicine and Rehabilitation  PMR Pre-admission  Signed  Date of Service:  07/28/2018 1:33 PM       Related encounter: Admission (Current) from 07/22/2018 in [facility]         Show:Clear all '[x]'$ Manual'[x]'$ Template'[x]'$ Copied  Added by: '[x]'$ Jose Staggers, MD'[x]'$ Jose Garrett, PT  '[]'$ Hover for details PMR Admission Coordinator Pre-Admission Assessment  Patient: Jose Garrett is an 68 y.o., male MRN: [id number] DOB: [birth year] Height: '5\' 10"'$  (177.8 cm) Weight: 94.8 kg  Insurance Information HMO:     PPO:      PCP:      IPA:      80/20:      OTHER:  PRIMARY: Medicare A and B      Policy#: [id number]      Subscriber: patient Pre-Cert#: verified via passport one online Eff. Date: A and B 04/12/2016     Deduct: $1408      Out of Pocket Max: n/a      Life Max: n/a CIR: 100%      SNF: 20 full days Outpatient: 100%     Co-Pay:  Home Health: 100%      Co-Pay:  DME: 80%     Co-Pay: 20% Providers: patient choice  SECONDARY:       Policy#:       Subscriber:  CM Name:       Phone#:      Fax#:  Pre-Cert#:       Employer:  Benefits:  Phone #:      Name:  Eff. Date:      Deduct:       Out of Pocket Max:       Life Max:  CIR:       SNF:  Outpatient:      Co-Pay:  Home Health:       Co-Pay:  DME:      Co-Pay:   Medicaid Application Date:       Case Manager:  Disability Application Date:       Case Worker:   The "Data Collection Information Summary" for patients in Inpatient Rehabilitation Facilities with attached "Privacy Act [place] Records" was provided and verbally reviewed with: Patient  Emergency Contact Information         Contact Information    Name Relation Home Work Jose Garrett Daughter [phone]  [phone]   Jose Garrett [phone]  [phone]      Current Medical History  Patient Admitting Diagnosis: L fempop bypass    History of Present Illness: Jose Garrett a 68 y.o.right-handed malewith history of end-stage renal disease due to cardiorenal syndrome in setting of AL amyloidosis with hemodialysis, aortic stenosis, hypertension, combined diastolic and systolic congestive heart failure followed by Jose Garrett.Presented 07/22/2018 with progressive discoloration and ischemia in his toes on the left side. Underwent noninvasive studies which detected noncompressible vessels with no flow detected in the left toes and dampened flow in the right toes. Underwent outpatient PV angiogram showing occlusion of bilateral SFAs but no gangrenous changes on the left side. It was felt he was a candidate for surgical intervention. Underwent left common femoral to below-knee popliteal artery bypass 07/22/2018 per Jose Garrett. Renal service follow-up with hemodialysis ongoing. Hospital course developed profound hypotension. EKG showed ST with RBBB suspect non-STEMI. Cardiology services consulted noted troponin 0.09, hemoglobin 6.8-7.3, potassium 5.9. He was transfused 1 unit packed red  blood cells. Underwent cardiac catheterization 07/24/2018 showed chronic total occlusion of RCA with L to Rcollaterals. Echocardiogram with ejection fraction of 40% moderately increased left ventricular wall thickness with reduced systolic function. Initially required heparin transition to aspirin and Plavix. Patient with ongoing bouts of symptomatic hypotension.   Patient's medical record from Jose Garrett has been reviewed by the rehabilitation admission coordinator and physician.  Past Medical History      Past Medical History:  Diagnosis Date  . Amyloidosis ([place])   . Anemia Dx 2016  . Anxiety    "especially with procedure"- 07/21/2018  . CAD (coronary artery disease)    a. cardiac cath on 11/25/17 with occlusion of the RCA which could have been his event but the RCA could not be engaged. The RCA filled  distally from left to right collaterals. There were no severe stenoses in the left coronary system. Medical therapy recommended, started on plavix.  . Chronic combined systolic and diastolic CHF (congestive heart failure) (Jose Garrett)    a. EF dropped to 35-40% with grade 2 DD by echo 11/2017.  [name] Diarrhea   . ESRD (end stage renal disease) ([place])    ESRD- HEMO MWF- [facility]  . Family history of adverse reaction to anesthesia    "my daughter can't take certain anesthesia agents" (09/08/2014)  . GERD (gastroesophageal reflux disease)   . Heart murmur   . History of blood transfusion 09/08/2014   "got hematoma after renal biopsy & HgB dropped"  . Hyperlipidemia Dx 2012  . Hypertension Dx 2012  . Myocardial infarction ([place]) 11/2017  . Neuropathy   . Peripheral vascular disease ([place])   . Restless legs     Family History   family history includes Diabetes in his father, mother, and sister; Heart disease in his mother; Hypertension in his father and mother; Skin cancer in his mother.  Prior Rehab/Hospitalizations Has the patient had prior rehab or hospitalizations prior to admission? Yes  Has the patient had major surgery during 100 days prior to admission? Yes             Current Medications  Current Facility-Administered Medications:  .  0.9 %  sodium chloride infusion (Manually program via Guardrails IV Fluids), , Intravenous, Once, Jose Jeans C, MD .  0.9 %  sodium chloride infusion (Manually program via Guardrails IV Fluids), , Intravenous, Once, Jose Parish, MD .  0.9 %  sodium chloride infusion, 500 mL, Intravenous, Once PRN, Jose Garrett, Jose J, PA-Garrett .  0.9 %  sodium chloride infusion, 250 mL, Intravenous, PRN, Jose Mocha, MD .  acetaminophen (TYLENOL) tablet 325-650 mg, 325-650 mg, Oral, Q4H PRN **OR** acetaminophen (TYLENOL) suppository 325-650 mg, 325-650 mg, Rectal, Q4H PRN, Jose Garrett, Jose J, PA-Garrett .  aspirin EC tablet 81 mg, 81 mg, Oral, Daily,  Jose Garrett, Jose J, PA-Garrett, 81 mg at 07/28/18 0959 .  atorvastatin (LIPITOR) tablet 80 mg, 80 mg, Oral, Q supper, Jose Garrett, Jose Shouts, PA-Garrett, 80 mg at 07/27/18 2115 .  bisacodyl (DULCOLAX) suppository 10 mg, 10 mg, Rectal, Daily PRN, Jose Garrett, Jose J, PA-Garrett .  calcitRIOL (ROCALTROL) capsule 1.25 mcg, 1.25 mcg, Oral, Q M,W,F-HD, Jose Parish, MD, 1.25 mcg at 07/28/18 1220 .  Chlorhexidine Gluconate Cloth 2 % PADS 6 each, 6 each, Topical, Q0600, Jose Parish, MD, 6 each at 07/27/18 0827 .  cinacalcet (SENSIPAR) tablet 60 mg, 60 mg, Oral, Q M,W,F-HD, Jose Parish, MD, 60 mg at 07/28/18 1221 .  clopidogrel (PLAVIX) tablet 75 mg, 75 mg, Oral, Q supper,  Gabriel Earing, PA-Garrett, 75 mg at 07/27/18 1644 .  [START ON 07/30/2018] Darbepoetin Alfa (ARANESP) injection 200 mcg, 200 mcg, Intravenous, Q Wed-HD, Jose Parish, MD .  docusate sodium (COLACE) capsule 100 mg, 100 mg, Oral, Daily, Jose Garrett, Jose J, PA-Garrett, 100 mg at 07/28/18 0959 .  [START ON 07/30/2018] ferric gluconate (NULECIT) 125 mg in sodium chloride 0.9 % 100 mL IVPB, 125 mg, Intravenous, Q Wed-HD, Jose Parish, MD .  gabapentin (NEURONTIN) capsule 300 mg, 300 mg, Oral, q morning - 10a, Jose Garrett, Jose J, PA-Garrett, 300 mg at 07/28/18 1000 .  guaiFENesin-dextromethorphan (ROBITUSSIN DM) 100-10 MG/5ML syrup 15 mL, 15 mL, Oral, Q4H PRN, Jose Garrett, Jose J, PA-Garrett .  heparin injection 1,800 Units, 20 Units/kg, Dialysis, PRN, Jose Parish, MD .  heparin injection 5,000 Units, 5,000 Units, Subcutaneous, Q8H, Jose Mocha, MD, 5,000 Units at 07/28/18 0559 .  loratadine (CLARITIN) tablet 10 mg, 10 mg, Oral, Daily, Jose Garrett, Jose J, PA-Garrett, 10 mg at 07/28/18 0959 .  magnesium sulfate IVPB 2 g 50 mL, 2 g, Intravenous, Daily PRN, Jose Garrett, Jose J, PA-Garrett .  metoprolol tartrate (LOPRESSOR) injection 2-5 mg, 2-5 mg, Intravenous, Q2H PRN, Jose Garrett, Jose J, PA-Garrett .  ondansetron (ZOFRAN) injection 4 mg, 4 mg, Intravenous, Q6H PRN,  Jose Garrett, Jose J, PA-Garrett, 4 mg at 07/26/18 1101 .  oxyCODONE (Oxy IR/ROXICODONE) immediate release tablet 5-10 mg, 5-10 mg, Oral, Q6H PRN, Jose Garrett, Jose J, PA-Garrett, 5 mg at 07/25/18 1413 .  oxyCODONE (OXYCONTIN) 12 hr tablet 10 mg, 10 mg, Oral, Daily PRN, Jose Garrett, Jose J, PA-Garrett .  pantoprazole (PROTONIX) EC tablet 40 mg, 40 mg, Oral, Daily, Jose Garrett, Jose J, PA-Garrett, 40 mg at 07/28/18 1000 .  phenol (CHLORASEPTIC) mouth spray 1 spray, 1 spray, Mouth/Throat, PRN, Jose Garrett, Jose J, PA-Garrett .  polyethylene glycol (MIRALAX / GLYCOLAX) packet 17 g, 17 g, Oral, Daily PRN, Jose Garrett, Jose J, PA-Garrett .  protein supplement (ENSURE MAX) liquid, 11 oz, Oral, TID WC, Waynetta Sandy, MD, 11 oz at 07/28/18 1219 .  sodium chloride flush (NS) 0.9 % injection 3 mL, 3 mL, Intravenous, Q12H, Reino Bellis B, NP, 3 mL at 07/27/18 0827 .  sodium chloride flush (NS) 0.9 % injection 3 mL, 3 mL, Intravenous, Q12H, Jose Mocha, MD, 3 mL at 07/28/18 1002 .  sodium chloride flush (NS) 0.9 % injection 3 mL, 3 mL, Intravenous, PRN, Jose Mocha, MD .  sucroferric oxyhydroxide [facility]) chewable tablet 1,000 mg, 1,000 mg, Oral, TID WC, Jose Garrett, Jose J, PA-Garrett, 1,000 mg at 07/28/18 1221 .  sucroferric oxyhydroxide (VELPHORO) chewable tablet 500 mg, 500 mg, Oral, With snacks, Waynetta Sandy, MD, 500 mg at 07/28/18 0959 .  vitamin B-12 (CYANOCOBALAMIN) tablet 1,000 mcg, 1,000 mcg, Oral, Daily, Jose Garrett, Jose J, PA-Garrett, 1,000 mcg at 07/28/18 9562  Patients Current Diet:     Diet Order                  Diet Carb Modified Fluid consistency: Thin; Room service appropriate? Yes  Diet effective now               Precautions / Restrictions Precautions Precautions: Fall Restrictions Weight Bearing Restrictions: No   Has the patient had 2 or more falls or a fall with injury in the past year? No  Prior Activity Level Community (5-7x/wk): went out daily, still driving, using SPC  Prior  Functional Level Self Care: Did the patient need help bathing, dressing, using the toilet or eating? Independent  Indoor Mobility: Did the  patient need assistance with walking from room to room (with or without device)? Independent  Stairs: Did the patient need assistance with internal or external stairs (with or without device)? Independent  Functional Cognition: Did the patient need help planning regular tasks such as shopping or remembering to take medications? [place] / Equipment Home Assistive Devices/Equipment: Eyeglasses, Radio producer (specify quad or straight) Home Equipment: Cane - single point  Prior Device Use: Indicate devices/aids used by the patient prior to current illness, exacerbation or injury? single point cane  Current Functional Level Cognition  Overall Cognitive Status: Within Functional Limits for tasks assessed Orientation Level: Oriented X4    Extremity Assessment (includes Sensation/Coordination)  Upper Extremity Assessment: Generalized weakness(HD fistula in R UE)  Lower Extremity Assessment: Defer to PT evaluation LLE Deficits / Details: Grossly decr AROM and strength, limited by pain postop; noted active quad contraction present, but weak; assist to lift LLE against gravity LLE: Unable to fully assess due to pain    ADLs  Overall ADL's : Needs assistance/impaired Eating/Feeding: Independent, Sitting Grooming: Oral care, Sitting, Set up Upper Body Bathing: Minimal assistance, Sitting Lower Body Bathing: Total assistance, Sit to/from stand Upper Body Dressing : Minimal assistance, Sitting Lower Body Dressing: Total assistance, Sit to/from stand    Mobility  Overal bed mobility: Needs Assistance Bed Mobility: Sit to Supine Supine to sit: Mod assist Sit to supine: Mod assist General bed mobility comments: modA with LE elevation back into bed,     Transfers  Overall transfer level: Needs assistance Equipment used:  Rolling walker (2 wheeled) Transfers: Sit to/from Stand Sit to Stand: Min assist General transfer comment: minA to power up    Ambulation / Gait / Stairs / Wheelchair Mobility  Ambulation/Gait Ambulation/Gait assistance: Herbalist (Feet): 60 Feet Assistive device: Rolling walker (2 wheeled) Gait Pattern/deviations: Step-to pattern, Trunk flexed, Antalgic, Decreased weight shift to left General Gait Details: minA for walker management, verbal cues to increased step length, pt very dependent on UEs due ot L LE pain, pt did report it improved with amb duration. Pt Garrett/o taxing on his breathing Gait velocity: slow Gait velocity interpretation: <1.8 ft/sec, indicate of risk for recurrent falls    Posture / Balance Balance Overall balance assessment: Needs assistance Sitting-balance support: No upper extremity supported, Feet supported Sitting balance-Leahy Scale: Fair Standing balance support: Bilateral upper extremity supported Standing balance-Leahy Scale: Poor Standing balance comment: requires UE support to maintain balance    Special needs/care consideration BiPAP/CPAP no CPM no Continuous Drip IV no Dialysis yes        Days MWF Life Vest no Oxygen no Special Bed no Trach Size no Wound Vac (area) no      Location n/a Skin incisions to L groin and LLE                             Bowel mgmt: continent, last BM 07/27/2018 Bladder mgmt: anuric Diabetic mgmt: no Behavioral consideration no Chemo/radiation no   Previous Home Environment (from acute therapy documentation) Living Arrangements: Alone Available Help at Discharge: Family, Available PRN/intermittently Type of Home: Apartment Home Layout: One level Home Access: Stairs to enter Entrance Stairs-Rails: Right Entrance Stairs-Number of Steps: 3 Bathroom Shower/Tub: Chiropodist: Standard Home Care Services: No Additional Comments: may go to his daughter's home  Discharge Living  Setting Plans for Discharge Living Setting: House, Other (Comment)(home with daughter) Type of Home at  Discharge: House Discharge Home Layout: One level Discharge Home Access: Level entry Discharge Bathroom Shower/Tub: Tub/shower unit Discharge Bathroom Toilet: Standard Discharge Bathroom Accessibility: Yes How Accessible: Accessible via walker Does the patient have any problems obtaining your medications?: No  Social/Family/Support Systems Anticipated Caregiver: daughter, Jose Garrett Anticipated Caregiver's Contact Information: [phone] Ability/Limitations of Caregiver: daughter works at a grocery store 4A-1P, states his exwife could probably step in during the day if he needed 24/7 supervision Caregiver Availability: Evenings only Discharge Plan Discussed with Primary Caregiver: Yes Is Caregiver In Agreement with Plan?: Yes Does Caregiver/Family have Issues with Lodging/Transportation while Pt is in Rehab?: No  Goals/Additional Needs Patient/Family Goal for Rehab: PT/OT mod I Expected length of stay: 7-10 days Cultural Considerations: n/a Dietary Needs: carb modified, thin Equipment Needs: tbd Special Service Needs: HD MWF Pt/Family Agrees to Admission and willing to participate: Yes Program Orientation Provided & Reviewed with Pt/Caregiver Including Roles  & Responsibilities: Yes   Possible need for SNF placement upon discharge: not anticipated  Patient Condition: I have reviewed medical records from [facility], spoken with CSW, and patient and daughter. I met with patient at the bedside and discussed with daughter via phone for inpatient rehabilitation assessment.  Patient will benefit from ongoing PT and OT, can actively participate in 3 hours of therapy a day 5 days of the week, and can make measurable gains during the admission.  Patient will also benefit from the coordinated team approach during an Inpatient Acute Rehabilitation admission.  The patient will receive  intensive therapy as well as Rehabilitation physician, nursing, social worker, and care management interventions.  Due to safety, skin/wound care, disease management, medication administration, pain management and patient education the patient requires 24 hour a day rehabilitation nursing.  The patient is currently min with mobility and basic ADLs.  Discharge setting and therapy post discharge at home with home health is anticipated.  Patient has agreed to participate in the Acute Inpatient Rehabilitation Program and will admit today.  Preadmission Screen Completed By:  Jose Garrett, PT, DPT 07/28/2018 2:50 PM ______________________________________________________________________   Discussed status with Dr. Naaman Plummer on 07/28/18  at 2:50 PM  and received approval for admission today.  Admission Coordinator:  Jose Garrett, PT, DPT 2:50 PM Jose Garrett 07/28/18    Assessment/Plan: Diagnosis: PVD with LE ischemia, s/p LLE fem-pop  bpg 1. Does the need for close, 24 hr/day Medical supervision in concert with the patient's rehab needs make it unreasonable for this patient to be served in a less intensive setting? Yes 2. Co-Morbidities requiring supervision/potential complications: ESRD, HTN, RBBB, NSTEMI/CAD, CHF 3. Due to bladder management, bowel management, safety, skin/wound care, disease management, medication administration, pain management and patient education, does the patient require 24 hr/day rehab nursing? Yes 4. Does the patient require coordinated care of a physician, rehab nurse, PT (1-2 hrs/day, 5 days/week) and OT (1-2 hrs/day, 5 days/week) to address physical and functional deficits in the context of the above medical diagnosis(es)? Yes Addressing deficits in the following areas: balance, endurance, locomotion, strength, transferring, bowel/bladder control, bathing, dressing, feeding, grooming, toileting and psychosocial support 5. Can the patient actively participate in an intensive  therapy program of at least 3 hrs of therapy 5 days a week? Yes 6. The potential for patient to make measurable gains while on inpatient rehab is excellent 7. Anticipated functional outcomes upon discharge from inpatients are: modified independent PT, modified independent OT, n/a SLP 8. Estimated rehab length of stay to reach the above functional goals  is: 7-10 days 9. Anticipated D/Garrett setting: Home 10. Anticipated post D/Garrett treatments: [place] therapy 11. Overall Rehab/Functional Prognosis: excellent  MD Signature: Jose Staggers, MD, [facility] 07/28/2018         Revision History

## 2018-07-28 NOTE — Progress Notes (Signed)
[facility] Progress Note   Subjective:  BP remains soft,  Got blood with not much change in number but feels better   Objective Vitals:   07/28/18 0200 07/28/18 0300 07/28/18 0400 07/28/18 0500  BP: 97/69 98/64 (!) 86/64 94/66  Pulse: 85 88 81 88  Resp: 15 15 17  (!) 21  Temp:   98.1 F (36.7 C)   TempSrc:   Oral   SpO2: 98% 100% 96% 100%  Weight:      Height:        Physical Exam General: WNWD elderly male NAD  Heart: RRR Lungs: CTAB Abdomen: soft NTND  Extremities: Left LE incision clean +pitting edema. No edema RLE  Dialysis Access: R forearm AVF +bruit   Weight change:    Additional Objective Labs: Basic Metabolic Panel: Recent Labs  Lab 07/24/18 0314 07/25/18 0520 07/26/18 0233  NA 137 134* 133*  K 5.4* 4.9 5.4*  CL 93* 92* 94*  CO2 28 25 22   GLUCOSE 135* 113* 113*  BUN 29* 59* 84*  CREATININE 6.24* 9.08* 10.83*  CALCIUM 9.5 9.2 8.7*  PHOS 7.1* 8.6*  --    CBC: Recent Labs  Lab 07/23/18 1849 07/24/18 0314 07/25/18 0520 07/26/18 0233 07/27/18 0430 07/28/18 0114  WBC 9.6 10.8* 9.0 7.6 9.0 7.8  NEUTROABS 7.7  --   --   --   --   --   HGB 7.4* 8.0* 7.3* 7.2* 6.8* 7.1*  HCT 21.5* 24.0* 21.9* 21.4* 21.1* 22.0*  MCV 94.3 96.8 97.3 96.0 100.5* 101.4*  PLT 122* 130* 124* 117* 125* 115*   Blood Culture    Component Value Date/Time   SDES URINE, RANDOM 02/01/2016 1707   SPECREQUEST NONE 02/01/2016 1707   CULT (A) 02/01/2016 1707    10,000 COLONIES/mL PSEUDOMONAS AERUGINOSA 40,000 COLONIES/mL ENTEROBACTER AEROGENES    REPTSTATUS 02/04/2016 FINAL 02/01/2016 1707     Medications: . sodium chloride    . sodium chloride    . [START ON 07/30/2018] ferric gluconate (FERRLECIT/NULECIT) IV    . magnesium sulfate bolus IVPB     . sodium chloride   Intravenous Once  . sodium chloride   Intravenous Once  . aspirin EC  81 mg Oral Daily  . atorvastatin  80 mg Oral Q supper  . calcitRIOL  1.25 mcg Oral Q M,W,F-HD  . Chlorhexidine  Gluconate Cloth  6 each Topical Q0600  . cinacalcet  60 mg Oral Q M,W,F-HD  . clopidogrel  75 mg Oral Q supper  . [START ON 07/30/2018] darbepoetin (ARANESP) injection - DIALYSIS  200 mcg Intravenous Q Wed-HD  . docusate sodium  100 mg Oral Daily  . gabapentin  300 mg Oral q morning - 10a  . heparin  5,000 Units Subcutaneous Q8H  . loratadine  10 mg Oral Daily  . pantoprazole  40 mg Oral Daily  . Ensure Max Protein  11 oz Oral TID WC  . sodium chloride flush  3 mL Intravenous Q12H  . sodium chloride flush  3 mL Intravenous Q12H  . sucroferric oxyhydroxide  1,000 mg Oral TID WC  . sucroferric oxyhydroxide  500 mg Oral With snacks  . vitamin B-12  1,000 mcg Oral Daily    Dialysis Orders:  [facility] MWF 4h 400/800 EDW 86kg 2K/2Ca UFP 4 Sensipar 60 TIW Calcitriol 1.25 TIW Mircera 50 q 2 weeks (last 4/29)  Assessment/Plan: 1. ESRD - Usually MWF.  Today  via AVF   2 Hyperkalemia - 2 K bath with next  HD. Got [place] 5/14.   3. PAD with critical limb ischemia - left lower extremity  - s/p left common femoral to below knee popliteal artery bypass graft- that seems to be going well  4.  Ischemic cardiomyopathy  - Optimizing volume with HD - cards saying that his preload needs to be maintained for the AS- will try to run even to only slightly neg today   5.  Anemia  - Secondary in part to ESRD as well as acute blood loss from procedure  - on aranesp and nulecit -  inc both doses - Transfuse 1 unit PRBC's on 5/13- and on 5/17  6.  Secondary hyperparathyroidism, metabolic bone disease  - On velphoro,  Sensipar, calcitriol    7. Elevated troponin- s/p cath with severe single vessel CAD chronic total chronic occlusion of RCA. Medical therapy recommended. No targets for PCI. Also found to have slightly decreased EF from baseline and moderate to severe AS-  May need intervention ?    Better today, per cards, try to maintain preload  [name], MD 07/28/2018

## 2018-07-28 NOTE — H&P (Signed)
Physical Medicine and Rehabilitation Admission H&P     Chief complaint: Weakness HPI: Jose Garrett is a 68 year old right-handed male with history of remote tobacco abuse 7 years ago.  End-stage renal disease due to cardiorenal syndrome in the setting of AL amyloidosis with hemodialysis, aortic stenosis, hypertension, combined diastolic and systolic congestive heart failure followed by Dr. Angelena Form.  Per chart review he lives alone used a cane for ambulation.  1 level home 3 steps to entry.  His daughter plans to stay with him on discharge and assist as needed.  Presented 07/22/2018 with progressive discoloration and ischemia in his toes on the left side.  Underwent noninvasive studies which detected noncompressible vessels with no flow detected in the left toes and dampened flow in the right toes.  Underwent outpatient PV angiogram showing occlusion of bilateral SFAs but no gangrenous changes on the left side.  He was felt to be a candidate for surgical intervention.  Underwent left common femoral to below-knee popliteal artery bypass 07/22/2018 per Dr. Servando Snare.  Renal service follow-up for hemodialysis ongoing.  Hospital course with hypotension.  EKG showed ST with RBBB suspect non-STEMI.  Cardiology services consulted noted troponin  0.09, hemoglobin 6.8-7.3, potassium 5.9.  He was transfused 1 unit packed red blood cells.  Underwent cardiac catheterization 07/24/2018 showed chronic total occlusion of RCA with left to right collaterals.  Echocardiogram with ejection fraction of 40% moderately increased left ventricular wall thickness with reduced systolic function.  Initially required IV heparin transition to aspirin and Plavix.  Close monitoring for asymptomatic hypotension.  Subcutaneous heparin was later initiated for DVT prophylaxis.  Therapy evaluations completed and patient was admitted for a comprehensive rehab program.   Review of Systems  Constitutional: Negative for chills and fever.   HENT: Negative for hearing loss.   Eyes: Negative for blurred vision and double vision.  Respiratory: Positive for shortness of breath.   Cardiovascular: Positive for chest pain, palpitations and leg swelling.  Gastrointestinal: Positive for constipation. Negative for heartburn, nausea and vomiting.  Genitourinary: Negative for dysuria, flank pain and hematuria.  Musculoskeletal: Negative for joint pain and myalgias.  Skin: Negative for rash.  Psychiatric/Behavioral: The patient has insomnia.        Anxiety  All other systems reviewed and are negative.       Past Medical History:  Diagnosis Date   Amyloidosis ([place])     Anemia Dx 2016   Anxiety      "especially with procedure"- 07/21/2018   CAD (coronary artery disease)      a. cardiac cath on 11/25/17 with occlusion of the RCA which could have been his event but the RCA could not be engaged. The RCA filled distally from left to right collaterals. There were no severe stenoses in the left coronary system. Medical therapy recommended, started on plavix.   Chronic combined systolic and diastolic CHF (congestive heart failure) ([place])      a. EF dropped to 35-40% with grade 2 DD by echo 11/2017.   Diarrhea     ESRD (end stage renal disease) ([place])      ESRD- HEMO MWF- [place]   Family history of adverse reaction to anesthesia      "my daughter can't take certain anesthesia agents" (09/08/2014)   GERD (gastroesophageal reflux disease)     Heart murmur     History of blood transfusion 09/08/2014    "got hematoma after renal biopsy & HgB dropped"   Hyperlipidemia Dx  2012   Hypertension Dx 2012   Myocardial infarction [facility]) 11/2017   Neuropathy     Peripheral vascular disease (HCC)     Restless legs           Past Surgical History:  Procedure Laterality Date   ABDOMINAL AORTOGRAM W/LOWER EXTREMITY Bilateral 07/16/2018    Procedure: ABDOMINAL AORTOGRAM W/LOWER EXTREMITY;  Surgeon: Wellington Hampshire, Jose Garrett;  Location: [place] [facility];  Service: Cardiovascular;  Laterality: Bilateral;   AV FISTULA PLACEMENT Right 12/27/2015    Procedure: Right Arm ARTERIOVENOUS (AV) FISTULA CREATION;  Surgeon: Angelia Mould, Jose Garrett;  Location: [place];  Service: Vascular;  Laterality: Right;   COLONOSCOPY       FEMORAL-POPLITEAL BYPASS GRAFT Left 07/22/2018    Procedure: left FEMORAL TO BELOW KNEE POPLITEAL ARTERY BYPASS GREAFT using 39mm removable ring propaten gore graft;  Surgeon: Waynetta Sandy, Jose Garrett;  Location: [place];  Service: Vascular;  Laterality: Left;   IR GENERIC HISTORICAL   01/27/2016    IR FLUORO GUIDE CV LINE RIGHT 01/27/2016 Arne Cleveland, Jose Garrett [facility]   IR GENERIC HISTORICAL   01/27/2016    IR US GUIDE VASC ACCESS RIGHT 01/27/2016 Arne Cleveland, Jose [facility]   LAPAROSCOPIC CHOLECYSTECTOMY   03/2011   LEFT HEART CATH AND CORONARY ANGIOGRAPHY N/A 11/25/2017    Procedure: LEFT HEART CATH AND CORONARY ANGIOGRAPHY;  Surgeon: Martinique, Peter M, Jose Garrett;  Location: [place] [facility];  Service: Cardiovascular;  Laterality: N/A;   LEFT HEART CATH AND CORONARY ANGIOGRAPHY N/A 07/24/2018    Procedure: LEFT HEART CATH AND CORONARY ANGIOGRAPHY;  Surgeon: Sherren Mocha, Jose Garrett;  Location: [place] [facility];  Service: Cardiovascular;  Laterality: N/A;   RENAL BIOPSY, PERCUTANEOUS Right 09/08/2014   RIGHT/LEFT HEART CATH AND CORONARY ANGIOGRAPHY N/A 03/25/2018    Procedure: RIGHT/LEFT HEART CATH AND CORONARY ANGIOGRAPHY;  Surgeon: Martinique, Peter M, Jose Garrett;  Location: [place] [facility];  Service: Cardiovascular;  Laterality: N/A;   TONSILLECTOMY   ~ [place]   03/25/2018    Procedure: Ultrasound Guidance For Vascular Access;  Surgeon: Martinique, Peter M, Jose Garrett;  Location: [place] [facility];  Service: Cardiovascular;;         Family History  Problem Relation Age of Onset   Hypertension Mother     Diabetes Mother     Heart disease Mother     Skin cancer Mother      Hypertension Father     Diabetes Father     Diabetes Sister      Social History:  reports that he quit smoking about 7 years ago. His smoking use included cigarettes. He has a 80.00 pack-year smoking history. He has never used smokeless tobacco. He reports that he does not drink alcohol or use drugs. Allergies: No Known Allergies       Medications Prior to Admission  Medication Sig Dispense Refill   acetaminophen (TYLENOL) 500 MG tablet Take 500-1,000 mg by mouth daily as needed (pain).        aspirin EC 81 MG tablet Take 1 tablet (81 mg total) by mouth daily. 30 tablet 11   atorvastatin (LIPITOR) 80 MG tablet Take 80 mg by mouth daily with supper.        cetirizine (ZYRTEC) 10 MG tablet Take 10 mg by mouth daily.       clopidogrel (PLAVIX) 75 MG tablet Take 75 mg by mouth daily with supper.  ethyl chloride spray Apply 1 application topically See admin instructions. Spray on arm immediately prior to dialysis on Monday, Wednesday and Friday       gabapentin (NEURONTIN) 300 MG capsule TAKE 1 CAPSULE BY MOUTH EVERY MORNING AND 3 CAPSULES EACH NIGHT AT BEDTIME (Patient taking differently: Take 300-900 mg by mouth See admin instructions. Take 1 capsule (300 mg) by mouth in the morning & take 3 capsules (900 mg) by mouth at bedtime) 120 capsule 11   oxyCODONE (OXY IR/ROXICODONE) 5 MG immediate release tablet Take 1-2 tablets (5-10 mg total) by mouth every 6 (six) hours as needed for severe pain. (Patient taking differently: Take 5 mg by mouth daily as needed for moderate pain. ) 60 tablet 0   oxyCODONE (OXYCONTIN) 10 mg 12 hr tablet Take 10 mg by mouth daily as needed (severe pain).        sucroferric oxyhydroxide (VELPHORO) 500 MG chewable tablet Chew 500-1,500 mg by mouth See admin instructions. Take 1 tablet (500 mg) by mouth with a small meal or snack & take 2 or 3 tablets (1000 - 1500 mg) by mouth with large meals       vitamin B-12 (CYANOCOBALAMIN) 1000 MCG tablet Take 1,000 mcg  by mouth daily.          Drug Regimen Review Drug regimen was reviewed and remains appropriate with no significant issues identified   Home: Home Living Family/patient expects to be discharged to:: Private residence Living Arrangements: Alone Available Help at Discharge: Family, Available PRN/intermittently Type of Home: Apartment Home Access: Stairs to enter Technical brewer of Steps: 3 Entrance Stairs-Rails: Right Home Layout: One level Bathroom Shower/Tub: Chiropodist: Standard Home Equipment: Cane - single point Additional Comments: may go to his daughter's home   Functional History: Prior Function Level of Independence: Independent with assistive device(s) Comments: Consistently uses cane to walk   Functional Status:  Mobility: Bed Mobility Overal bed mobility: Needs Assistance Bed Mobility: Sit to Supine Supine to sit: Mod assist Sit to supine: Mod assist General bed mobility comments: modA with LE elevation back into bed,  Transfers Overall transfer level: Needs assistance Equipment used: Rolling walker (2 wheeled) Transfers: Sit to/from Stand Sit to Stand: Min assist General transfer comment: minA to power up Ambulation/Gait Ambulation/Gait assistance: Min assist Gait Distance (Feet): 60 Feet Assistive device: Rolling walker (2 wheeled) Gait Pattern/deviations: Step-to pattern, Trunk flexed, Antalgic, Decreased weight shift to left General Gait Details: minA for walker management, verbal cues to increased step length, pt very dependent on UEs due ot L LE pain, pt did report it improved with amb duration. Pt c/o taxing on his breathing Gait velocity: slow Gait velocity interpretation: <1.8 ft/sec, indicate of risk for recurrent falls   ADL: ADL Overall ADL's : Needs assistance/impaired Eating/Feeding: Independent, Sitting Grooming: Oral care, Sitting, Set up Upper Body Bathing: Minimal assistance, Sitting Lower Body Bathing:  Total assistance, Sit to/from stand Upper Body Dressing : Minimal assistance, Sitting Lower Body Dressing: Total assistance, Sit to/from stand   Cognition: Cognition Overall Cognitive Status: Within Functional Limits for tasks assessed Orientation Level: Oriented X4 Cognition Arousal/Alertness: Awake/alert Behavior During Therapy: WFL for tasks assessed/performed Overall Cognitive Status: Within Functional Limits for tasks assessed   Physical Exam: Blood pressure 93/60, pulse 87, temperature 98.4 F (36.9 C), temperature source Oral, resp. rate (!) 7, height 5\' 10"  (1.778 m), weight 89.2 kg, SpO2 100 %. Physical Exam  Constitutional: No distress.  HENT:  Head: Normocephalic and atraumatic.  Eyes:  Pupils are equal, round, and reactive to light. EOM are normal.  Neck: Normal range of motion. No thyromegaly present.  Cardiovascular: Normal rate and regular rhythm. Exam reveals no friction rub.  No murmur heard. Respiratory: Effort normal. No respiratory distress. He has no wheezes.  GI: Soft. He exhibits no distension.  Musculoskeletal:        General: No edema (LLE 2+, RLE trace to 1+).  Neurological:  Patient is alert pleasant sitting up in bed in no acute distress.  Follows commands.  Oriented to person, place and time.  Skin: Skin is warm. He is not diaphoretic.  Femoropopliteal bypass site clean and dry, left foot dusky, necrotic tissue on plantar aspect of toes 3-5, areas of ischemia along toes an foot. RLE with areas of ischemia in foot to a lesser extent.   Psychiatric: He has a normal mood and affect. His behavior is normal. Judgment and thought content normal.      Lab Results Last 48 Hours  Results for orders placed or performed during the hospital encounter of 07/22/18 (from the past 48 hour(s))  CBC     Status: Abnormal    Collection Time: 07/27/18  4:30 AM  Result Value Ref Range    WBC 9.0 4.0 - 10.5 K/uL    RBC 2.10 (L) 4.22 - 5.81 MIL/uL    Hemoglobin 6.8 (LL)  13.0 - 17.0 g/dL      Comment: REPEATED TO VERIFY THIS CRITICAL RESULT HAS VERIFIED AND BEEN CALLED TO RN RAMON MEVA BY MESSAN HOUEGNIFIO ON 05 17 2020 AT 0625, AND HAS BEEN READ BACK.       HCT 21.1 (L) 39.0 - 52.0 %    MCV 100.5 (H) 80.0 - 100.0 fL    MCH 32.4 26.0 - 34.0 pg    MCHC 32.2 30.0 - 36.0 g/dL    RDW 15.6 (H) 11.5 - 15.5 %    Platelets 125 (L) 150 - 400 K/uL    nRBC 0.7 (H) 0.0 - 0.2 %      Comment: Performed at [facility] [address], [place], [place] [address]  Prepare RBC     Status: None    Collection Time: 07/27/18  7:37 AM  Result Value Ref Range    Order Confirmation          ORDER PROCESSED BY BLOOD BANK Performed at [facility], [place] [address] [place], [place] [address]    CBC     Status: Abnormal    Collection Time: 07/28/18  1:14 AM  Result Value Ref Range    WBC 7.8 4.0 - 10.5 K/uL    RBC 2.17 (L) 4.22 - 5.81 MIL/uL    Hemoglobin 7.1 (L) 13.0 - 17.0 g/dL      Comment: REPEATED TO VERIFY POST TRANSFUSION SPECIMEN      HCT 22.0 (L) 39.0 - 52.0 %    MCV 101.4 (H) 80.0 - 100.0 fL    MCH 32.7 26.0 - 34.0 pg    MCHC 32.3 30.0 - 36.0 g/dL    RDW 15.7 (H) 11.5 - 15.5 %    Platelets 115 (L) 150 - 400 K/uL      Comment: REPEATED TO VERIFY PLATELET COUNT CONFIRMED BY SMEAR SPECIMEN CHECKED FOR CLOTS Immature Platelet Fraction may be clinically indicated, consider ordering this additional test [id number]      nRBC 1.2 (H) 0.0 - 0.2 %      Comment: Performed at [facility],  [address], [place], [place] [address]      Imaging Results (Last 48 hours)  No results found.           Medical Problem List and Plan: 1.  Debility secondary to PAD status post left common femoral to below-knee popliteal artery bypass 9/37/1696 complicated by non-STEMI             -admit to inpatient rehab 2.  Antithrombotics: -DVT/anticoagulation: Subcutaneous heparin             -antiplatelet therapy: Aspirin 81 mg daily, Plavix 75 mg daily 3.  Pain Management: Neurontin 300 mg every evening, oxycodone as needed 4. Mood: Provide emotional support             -antipsychotic agents: N/A 5. Neuropsych: This patient is capable of making decisions on his own behalf. 6. Skin/Wound Care: Routine skin checks. Local skin care, protection to necrotic areas along toes of left foot. All toes appear viable.  7. Fluids/Electrolytes/Nutrition: Routine in and outs with follow-up chemistries 8.  Acute blood loss anemia.  Follow-up CBC.  Continue Aranesp 9.  Hypotension/aortic stenosis.  Monitor with increased mobility 10.  End-stage renal disease.  Continue hemodialysis as directed.             -daily weights 11.  Hyperlipidemia.  Lipitor 12.  Chronic combined diastolic systolic congestive heart failure.  Monitor for any signs of fluid overload             -daily weights 13.  Constipation.  Laxative assistance     Post Admission Physician Evaluation: 1. Functional deficits secondary  to debility. 2. Patient is admitted to receive collaborative, interdisciplinary care between the physiatrist, rehab nursing staff, and therapy team. 3. Patient's level of medical complexity and substantial therapy needs in context of that medical necessity cannot be provided at a lesser intensity of care such as a SNF. 4. Patient has experienced substantial functional loss from his/her baseline which was documented above under the "Functional History" and "Functional Status" headings.  Judging by the patient's diagnosis, physical exam, and functional history, the patient has potential for functional progress which will result in measurable gains while on inpatient rehab.  These gains will be of substantial and practical use upon discharge  in facilitating mobility and self-care at the household level. 5. Physiatrist will provide 24 hour management of medical needs as well as oversight of the therapy plan/treatment and provide guidance as appropriate regarding the  interaction of the two. 6. The Preadmission Screening has been reviewed and patient status is unchanged unless otherwise stated above. 7. 24 hour rehab nursing will assist with bladder management, bowel management, safety, skin/wound care, disease management, medication administration, pain management and patient education  and help integrate therapy concepts, techniques,education, etc. 8. PT will assess and treat for/with: Lower extremity strength, range of motion, stamina, balance, functional mobility, safety, adaptive techniques and equipment.   Goals are: mod I. 9. OT will assess and treat for/with: ADL's, functional mobility, safety, upper extremity strength, adaptive techniques and equipment.   Goals are: mod I. Therapy may proceed with showering this patient. 10. SLP will assess and treat for/with: n/a.  Goals are: n/a. 11. Case Management and Social Worker will assess and treat for psychological issues and discharge planning. 12. Team conference will be held weekly to assess progress toward goals and to determine barriers to discharge. 13. Patient will receive at least 3 hours of therapy per day at least 5 days per week. 14.  ELOS: 7-10 days       15. Prognosis:  excellent   I have personally performed a face to face diagnostic evaluation of this patient and formulated the key components of the plan.  Additionally, I have personally reviewed laboratory data, imaging studies, as well as relevant notes and concur with the physician assistant's documentation above.  Jose Staggers, Jose Garrett, Jose Garrett       Jose Paganini Kimball, PA-C 07/28/2018

## 2018-07-28 NOTE — Progress Notes (Signed)
Occupational Therapy Assessment and Plan  Patient Details  Name: Jose Garrett MRN: [id number] Date of Birth: Feb 24, 1951  OT Diagnosis: acute pain, muscle weakness (generalized) and decreased activity tolerance Rehab Potential: Rehab Potential (ACUTE ONLY): Excellent ELOS: 12-14 days   Today's Date: 07/29/2018 OT Individual Time: 7510-2585 OT Individual Time Calculation (min): 57 min     Problem List:  Patient Active Problem List   Diagnosis Date Noted  . Thrombocytopenia ([place])   . Chronic combined systolic and diastolic congestive heart failure ([place])   . ESRD on dialysis ([place])   . Hemodialysis-associated hypotension   . Acute blood loss anemia   . Debility 07/28/2018  . PAD (peripheral artery disease) ([place]) 07/22/2018  . Shortness of breath 03/25/2018  . Chest pain   . Dyspnea 03/23/2018  . Acute on chronic combined systolic and diastolic CHF (congestive heart failure) ([place]) 11/30/2017  . Non-ST elevation (NSTEMI) myocardial infarction ([place])   . Elevated troponin 11/25/2017  . Respiratory failure ([place])   . Neuropathy 03/14/2017  . Fever   . CKD (chronic kidney disease) stage 5, GFR less than 15 ml/min (HCC)   . ESRD (end stage renal disease) on dialysis ([place])   . Acute renal failure superimposed on chronic kidney disease ([place])   . Hypervolemia   . Acute diastolic (congestive) heart failure ([place]) 01/23/2016  . Fluid overload, unspecified 01/23/2016  . Acute pulmonary edema ([place]) 01/23/2016  . Skin tear of right forearm without complication 27/78/2423  . Iron deficiency anemia 11/22/2015  . Nonrheumatic aortic valve stenosis 08/22/2015  . Chronic kidney disease with dialysis modality undecided, stage 5 ([place]): Progressive 08/01/2015  . B12 deficiency 07/19/2015  . Seasonal allergies 06/30/2015  . Back pain 03/18/2015  . Hip pain 02/18/2015  . AL amyloidosis ([place]) 09/25/2014  . Anemia of chronic disease 09/25/2014  . Abnormal bruising 09/25/2014  . Renal hematoma, right  09/08/2014  . Chronic diastolic heart failure, NYHA class 1 ([place]) 09/08/2014  . Hematuria   . Proteinuria   . Vitamin D deficiency 01/21/2014  . Hypertriglyceridemia 01/21/2014  . Essential hypertension, benign 11/06/2013  . Dependent edema 11/06/2013  . Jose Garrett (dyspnea on exertion) 11/06/2013  . Other malaise and fatigue 11/06/2013    Past Medical History:  Past Medical History:  Diagnosis Date  . Amyloidosis ([place])   . Anemia Dx 2016  . Anxiety    "especially with procedure"- 07/21/2018  . CAD (coronary artery disease)    a. cardiac cath on 11/25/17 with occlusion of the RCA which could have been his event but the RCA could not be engaged. The RCA filled distally from left to right collaterals. There were no severe stenoses in the left coronary system. Medical therapy recommended, started on plavix.  . Chronic combined systolic and diastolic CHF (congestive heart failure) ([place])    a. EF dropped to 35-40% with grade 2 DD by echo 11/2017.  [name] Diarrhea   . ESRD (end stage renal disease) ([place])    ESRD- HEMO MWF- [facility]  . Family history of adverse reaction to anesthesia    "my daughter can't take certain anesthesia agents" (09/08/2014)  . GERD (gastroesophageal reflux disease)   . Heart murmur   . History of blood transfusion 09/08/2014   "got hematoma after renal biopsy & HgB dropped"  . Hyperlipidemia Dx 2012  . Hypertension Dx 2012  . Myocardial infarction ([place]) 11/2017  . Neuropathy   . Peripheral vascular disease ([place])   . Restless legs    Past  Surgical History:  Past Surgical History:  Procedure Laterality Date  . ABDOMINAL AORTOGRAM W/LOWER EXTREMITY Bilateral 07/16/2018   Procedure: ABDOMINAL AORTOGRAM W/LOWER EXTREMITY;  Surgeon: Wellington Hampshire, MD;  Location: [facility];  Service: Cardiovascular;  Laterality: Bilateral;  . AV FISTULA PLACEMENT Right 12/27/2015   Procedure: Right Arm ARTERIOVENOUS (AV) FISTULA CREATION;  Surgeon: Angelia Mould, MD;   Location: [facility];  Service: Vascular;  Laterality: Right;  . COLONOSCOPY    . FEMORAL-POPLITEAL BYPASS GRAFT Left 07/22/2018   Procedure: left FEMORAL TO BELOW KNEE POPLITEAL ARTERY BYPASS GREAFT using 99m removable ring propaten gore graft;  Surgeon: CWaynetta Sandy MD;  [facility]  Service: Vascular;  Laterality: Left;  . IR GENERIC HISTORICAL  01/27/2016   IR FLUORO GUIDE CV LINE RIGHT 01/27/2016 DArne Cleveland MD [facility]  . IR GENERIC HISTORICAL  01/27/2016   IR [place] VASC ACCESS RIGHT 01/27/2016 DArne Cleveland MD [facility]  . LAPAROSCOPIC CHOLECYSTECTOMY  03/2011  . LEFT HEART CATH AND CORONARY ANGIOGRAPHY N/A 11/25/2017   Procedure: LEFT HEART CATH AND CORONARY ANGIOGRAPHY;  Surgeon: JMartinique Peter M, MD;  [facility];  Service: Cardiovascular;  Laterality: N/A;  . LEFT HEART CATH AND CORONARY ANGIOGRAPHY N/A 07/24/2018   Procedure: LEFT HEART CATH AND CORONARY ANGIOGRAPHY;  Surgeon: CSherren Mocha MD;  [facility];  Service: Cardiovascular;  Laterality: N/A;  . RENAL BIOPSY, PERCUTANEOUS Right 09/08/2014  . RIGHT/LEFT HEART CATH AND CORONARY ANGIOGRAPHY N/A 03/25/2018   Procedure: RIGHT/LEFT HEART CATH AND CORONARY ANGIOGRAPHY;  Surgeon: JMartinique Peter M, MD;  [facility];  Service: Cardiovascular;  Laterality: N/A;  . TONSILLECTOMY  ~ 1960  . ULTRASOUND GUIDANCE FOR VASCULAR ACCESS  03/25/2018   Procedure: Ultrasound Guidance For Vascular Access;  Surgeon: JMartinique Peter M, MD;  [facility];  Service: Cardiovascular;;    Assessment & Plan Clinical Impression: Patient is a 68y.o. year old male with recent admission to the hospital on 07/22/2018 with progressive discoloration and ischemia in his toes on the left side.  Underwent noninvasive studies which detected noncompressible vessels with no flow detected in the left toes and dampened flow in the right toes.  Underwent outpatient PV angiogram  showing occlusion of bilateral SFAs but no gangrenous changes on the left side.  He was felt to be a candidate for surgical intervention.  Underwent left common femoral to below-knee popliteal artery bypass 07/22/2018 per Dr. BServando Snare  Renal service follow-up for hemodialysis ongoing.  Hospital course with hypotension.  EKG showed ST with RBBB suspect non-STEMI.  Cardiology services consulted noted troponin  0.09, hemoglobin 6.8-7.3, potassium 5.9.  He was transfused 1 unit packed red blood cells.  Underwent cardiac catheterization 07/24/2018 showed chronic total occlusion of RCA with left to right collaterals. Patient transferred to CIR on 07/28/2018 .    Patient currently requires max with basic self-care skills secondary to muscle weakness, decreased cardiorespiratoy endurance and decreased standing balance and decreased balance strategies.  Prior to hospitalization, patient could complete BADL with independent .  Patient will benefit from skilled intervention to increase independence with basic self-care skills prior to discharge home with care partner.  Anticipate patient will require intermittent supervision and follow up home health.  OT - End of Session Endurance Deficit: Yes Endurance Deficit Description: rest breaks within BADL tasks OT Assessment Rehab Potential (ACUTE ONLY): Excellent OT Patient demonstrates impairments in the following area(s): Balance;Endurance;Pain;Motor OT Basic  ADL's Functional Problem(s): Eating;Grooming;Bathing;Dressing;Toileting OT Transfers Functional Problem(s): Toilet;Tub/Shower OT Additional Impairment(s): None OT Plan OT Intensity: Minimum of 1-2 x/day, 45 to 90 minutes OT Frequency: 5 out of 7 days OT Duration/Estimated Length of Stay: 12-14 days OT Treatment/Interventions: Medical illustrator training;Community reintegration;Discharge planning;Disease mangement/prevention;DME/adaptive equipment instruction;Functional mobility training;Pain  management;Patient/family education;Psychosocial support;Self Care/advanced ADL retraining;Therapeutic Activities;Therapeutic Exercise;UE/LE Strength taining/ROM;UE/LE Coordination activities OT Self Feeding Anticipated Outcome(s): Independent OT Basic Self-Care Anticipated Outcome(s): Independent OT Toileting Anticipated Outcome(s): Mod I OT Bathroom Transfers Anticipated Outcome(s): Supervision OT Recommendation Patient destination: Home Follow Up Recommendations: Home health OT Equipment Recommended: To be determined Skilled Therapeutic Intervention Pt greeted seated EOB eating breakfast and agreeable to OT treatment session. OT eval completed addressing rehab process, OT purpose, POC, ELOS, and goals. Sit<>stand from EOB with max A, then mod A to pivot to wc with AD. Pt brought to the sink for bathing/dressing tasks. Pt needed extended rest break after stand-pivot transfer prior to participating in [place]. UB bathing/dressing completed with CGA. Max A for LB bathing/dressing. Sit<>stand at the sink with mod A and verbal cues for technique. Pt unable to remove unilateral UE to wash buttocks requiring OT assist. Smear of BM noted, but pt declined needing to go to the bathroom. OT donned incontinence brief as well. Pt tolerated standing for 30 seconds. Pt needed another extended rest break after standing. Pt needed assistance to thread pant legs, then stated he did not know if he could stand again. Given pt fatigue, brought wc over to recliner and provided pt with RW. Max A to come to standing, then OT pulled pants up over hips. Pt then able to pivot over to the recliner with RW and min A. Pt left seated in recliner with alarm belt on and needs met.    OT Evaluation Precautions/Restrictions  Precautions Precautions: Fall Restrictions Weight Bearing Restrictions: No Pain Pain Assessment Pain Scale: 0-10 Pain Score: 4  Pain Type: Acute pain;Surgical pain Pain Location: Leg Pain Orientation:  Left Pain Onset: On-going Home Living/Prior Functioning Home Living Family/patient expects to be discharged to:: Private residence Living Arrangements: Alone Available Help at Discharge: Family, Available PRN/intermittently Type of Home: House Home Access: Stairs to enter Technical brewer of Steps: 1(one 4 inch step) Entrance Stairs-Rails: Right Home Layout: One level Bathroom Shower/Tub: Chiropodist: Standard Additional Comments: Plans to go stay with middle daughter- her house is described above  Lives With: Alone IADL History Leisure and Hobbies: Enjoys eating out and shopping IADL Comments: Active and independent PTA Prior Function Level of Independence: Independent with basic ADLs, Independent with homemaking with ambulation  Able to Take Stairs?: Yes Driving: Yes Vocation: Retired Comments: Uses a cane to walk outside of the house ADL ADL Eating: Set up Grooming: Supervision/safety, Setup Upper Body Bathing: Setup Lower Body Bathing: Maximal assistance Upper Body Dressing: Minimal assistance Lower Body Dressing: Maximal assistance Toileting: Moderate assistance Toilet Transfer: Moderate assistance Tub/Shower Transfer: Unable to assess Vision Baseline Vision/History: Wears glasses Wears Glasses: Reading only Patient Visual Report: No change from baseline Perception  Perception: Within Functional Limits Praxis Praxis: Intact Cognition Overall Cognitive Status: Within Functional Limits for tasks assessed Arousal/Alertness: Awake/alert Orientation Level: Person;Situation;Place Person: Oriented Place: Oriented Situation: Oriented Year: 2020 Month: May Day of Week: Correct Memory: Appears intact Immediate Memory Recall: Sock;Blue;Bed Memory Recall: Sock;Bed;Blue Memory Recall Sock: Without Cue Memory Recall Blue: Without Cue Memory Recall Bed: With Cue Awareness: Appears intact Problem Solving: Appears intact Safety/Judgment:  Appears intact Sensation Sensation Light Touch: Impaired by  gross assessment(diminished distally, LLE>RLE, 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) Coordination Gross Motor Movements are Fluid and Coordinated: No Fine Motor Movements are Fluid and Coordinated: Yes Coordination and Movement Description: limited 2/2 guarding and pain in LLE w/ all movements Motor  Motor Motor: Within Functional Limits Motor - Skilled Clinical Observations: generalized weakness Trunk/Postural Assessment  Cervical Assessment Cervical Assessment: Within Functional Limits Thoracic Assessment Thoracic Assessment: Within Functional Limits Lumbar Assessment Lumbar Assessment: Within Functional Limits Postural Control Postural Control: Within Functional Limits  Balance Balance Balance Assessed: Yes Static Sitting Balance Static Sitting - Balance Support: No upper extremity supported Static Sitting - Level of Assistance: 5: Stand by assistance Dynamic Sitting Balance Dynamic Sitting - Balance Support: No upper extremity supported Dynamic Sitting - Level of Assistance: 5: Stand by assistance Static Standing Balance Static Standing - Balance Support: During functional activity Static Standing - Level of Assistance: 4: Min assist Dynamic Standing Balance Dynamic Standing - Balance Support: During functional activity Dynamic Standing - Level of Assistance: 3: Mod assist Extremity/Trunk Assessment RUE Assessment RUE Assessment: Within Functional Limits LUE Assessment LUE Assessment: Within Functional Limits    Refer to Care Plan for Long Term Goals  Recommendations for other services: None    Discharge Criteria: Patient will be discharged from OT if patient refuses treatment 3 consecutive times without medical reason, if treatment goals not met, if there is a change in medical status, if patient makes no progress towards goals or if patient is discharged from hospital.  The above  assessment, treatment plan, treatment alternatives and goals were discussed and mutually agreed upon: by patient  Valma Cava 07/29/2018, 4:03 PM

## 2018-07-28 NOTE — Progress Notes (Signed)
Patient arrived via wheelchair with staff to Inpatient Rehab. He didn't want to lay in bed so we made him comfortable in the recliner. He did complain of some pain in the let leg but said that was due to the moving with transfer. Patient is in room comfortably waiting on dinner.

## 2018-07-28 NOTE — Progress Notes (Signed)
Physical Therapy Treatment Patient Details Name: Jose Garrett MRN: [id number] DOB: [birth year] Today's Date: 07/28/2018    History of Present Illness Admitted with abnormal vascular studies, now s/p Left common femoral to below-knee popliteal artery bypass graft; extensive medical problems including ESRD on HD, AL amyloid (kidney involvement), aortic stenosis, HTN, HLD, GERD, anemia, chronic combined CHF and CAD     PT Comments    Pt progressing well. Pt c/o L LE pain and taxing on his breathing during ambulation. Gave pt a HEP for L LE to improve ankle and knee ROM and to help manage swelling. Cont to recommend CIR upon d/c. Acute PT to cont to follow.    Follow Up Recommendations  CIR;Supervision/Assistance - 24 hour     Equipment Recommendations  Rolling walker with 5" wheels;3in1 (PT)    Recommendations for Other Services       Precautions / Restrictions Precautions Precautions: Fall Restrictions Weight Bearing Restrictions: No    Mobility  Bed Mobility Overal bed mobility: Needs Assistance Bed Mobility: Sit to Supine       Sit to supine: Mod assist   General bed mobility comments: modA with LE elevation back into bed,   Transfers Overall transfer level: Needs assistance Equipment used: Rolling walker (2 wheeled) Transfers: Sit to/from Stand Sit to Stand: Min assist         General transfer comment: minA to power up  Ambulation/Gait Ambulation/Gait assistance: Min assist Gait Distance (Feet): 60 Feet Assistive device: Rolling walker (2 wheeled) Gait Pattern/deviations: Step-to pattern;Trunk flexed;Antalgic;Decreased weight shift to left Gait velocity: slow Gait velocity interpretation: <1.8 ft/sec, indicate of risk for recurrent falls General Gait Details: minA for walker management, verbal cues to increased step length, pt very dependent on UEs due ot L LE pain, pt did report it improved with amb duration. Pt c/o taxing on his breathing   Stairs              Wheelchair Mobility    Modified Rankin (Stroke Patients Only)       Balance Overall balance assessment: Needs assistance Sitting-balance support: No upper extremity supported;Feet supported Sitting balance-Leahy Scale: Fair     Standing balance support: Bilateral upper extremity supported Standing balance-Leahy Scale: Poor Standing balance comment: requires UE support to maintain balance                            Cognition Arousal/Alertness: Awake/alert Behavior During Therapy: WFL for tasks assessed/performed Overall Cognitive Status: Within Functional Limits for tasks assessed                                        Exercises General Exercises - Lower Extremity Ankle Circles/Pumps: AROM;Both;10 reps;Supine Quad Sets: AROM;Left;10 reps;Supine Heel Slides: AROM;Left;10 reps;Seated(slide on pillow case on floor)    General Comments General comments (skin integrity, edema, etc.): pt with dark soares on bottom of L toes      Pertinent Vitals/Pain Pain Assessment: Faces Pain Score: 7  Pain Location: L LE Pain Descriptors / Indicators: Aching;Grimacing;Guarding;Moaning Pain Intervention(s): Limited activity within patient's tolerance    Home Living                      Prior Function            PT Goals (current goals can now be found in the care  plan section) Acute Rehab PT Goals Patient Stated Goal: home Progress towards PT goals: Progressing toward goals    Frequency    Min 3X/week      PT Plan Current plan remains appropriate    Co-evaluation              AM-PAC PT "6 Clicks" Mobility   Outcome Measure  Help needed turning from your back to your side while in a flat bed without using bedrails?: A Little Help needed moving from lying on your back to sitting on the side of a flat bed without using bedrails?: A Lot Help needed moving to and from a bed to a chair (including a wheelchair)?: A  Lot Help needed standing up from a chair using your arms (e.g., wheelchair or bedside chair)?: A Lot Help needed to walk in hospital room?: A Lot Help needed climbing 3-5 steps with a railing? : A Lot 6 Click Score: 13    End of Session Equipment Utilized During Treatment: Gait belt Activity Tolerance: Patient limited by fatigue;Patient limited by pain Patient left: in bed;with call bell/phone within reach;with bed alarm set Nurse Communication: Mobility status PT Visit Diagnosis: Unsteadiness on feet (R26.81);Other abnormalities of gait and mobility (R26.89);Pain Pain - Right/Left: Left Pain - part of body: Leg     Time: 1000-1030 PT Time Calculation (min) (ACUTE ONLY): 30 min  Charges:  $Gait Training: 8-22 mins $Therapeutic Exercise: 8-22 mins                     Kittie Plater, PT, DPT Acute Rehabilitation Services Pager #: [phone] Office #: [phone]    Berline Lopes 07/28/2018, 11:36 AM

## 2018-07-28 NOTE — Discharge Summary (Signed)
Discharge Summary     Jose Garrett [birth year] 68 y.o. male  [id number]  Admission Date: 07/22/2018  Discharge Date: 07/28/2018  Physician: Thomes Lolling*  Admission Diagnosis: critical limb ischemia   HPI:   This is a 68 y.o. male who was seen by Dr. Fletcher Anon due to abnormal vascular studies. This reveals his SFA bilaterally has an occlusion as well as the left popliteal artery and TP trunk occlusion.   He has some gangrenous toes on the left that he states has been going on for a couple of weeks.  Over the past couple of months, he has color changes in the left foot.  He walks with a cane.   Pt has hx of amyloid that is followed by Oncology.  He has ESRD and dialyzes M/W/F at the [facility] location.  His most recent echo revealed an LVEF of 65-70% with moderate AS.   He has CAD that is being treated medically.  The pt is on a statin for cholesterol management.  The pt is on a daily aspirin.   Other AC:  Plavix The pt is not on medication for hypertension.   The pt is not diabetic.   Tobacco hx:  remote  Hospital Course:  The patient was admitted to the hospital and taken to the operating room on 07/24/2018 and underwent: 1.  Left common femoral to below-knee popliteal artery bypass graft with 6 mm ringed PTFE 2.  Harvest of left below-knee greater saphenous vein    Findings: Common femoral bifurcation was under the inguinal ligament.  The common femoral artery itself was heavily calcified and limited endarterectomy was performed.  We did have backbleeding from the profunda.  The below-knee popliteal artery was also heavily calcified and we used a tourniquet as I did not have an area for clamping.  Following bypass there was very strong signal with posterior tibial artery at the ankle which was absent with compression of the graft.  The below knee popliteal saphenous vein was harvested for approximately 10 cm but was quite sclerotic and could not be cannulated we  did not harvest the thigh saphenous vein for this reason.  The pt tolerated the procedure well and was transported to the PACU in good condition.   By POD 1, pt was not feeling well and was mildly hypotensive and short of breath after HD.  12 lead was similar to pre op.  Pt was started on heparin gtt.  He did have elevated troponin and cardiology was consulted.    Suspect demand ischemia in the setting of acute anemia and ESRD on HD. He has known CAD with occluded RCA filling via collaterals from cath back in January. For now would plan to treat medically. -- trend troponin -- EKG in the am -- on ASA, statin, plavix. Placed on IV heparin via primary, though with anemia may need to hold for now unless significant troponin elevation.   BLE venous dopplers obtained and there was no evidence of DVT.    By POD 2, pt continued to have elevated troponin.  It was suspected this was secondary to underlying CAD.  Will repeat cardiac catheterization on 07/24/2018: Conclusion: 1. Severe single vessel CAD with chronic total occlusion of the RCA, collateralized with L--->R collaterals 2. Mild calcific nonobstructive LCA disease, anatomy unchanged from previous cath studies 3. At least moderate AS, possibly severe LFLG - will check echo  2D echo was obtained with the following results: IMPRESSIONS    1. The left  ventricle has moderately reduced systolic function, with an ejection fraction of 35-40%. The cavity size was normal. There is moderately increased left ventricular wall thickness. Left ventricular diastolic Doppler parameters are consistent  with impaired relaxation. Elevated left atrial and left ventricular end-diastolic pressures The E/e' is >15.  2. Moderate akinesis of the left ventricular, entire inferior wall.  3. The right ventricle has normal systolic function. The cavity was normal. There is no increase in right ventricular wall thickness.  4. The mitral valve is abnormal. Mild  thickening of the mitral valve leaflet. There is mild mitral annular calcification present.  5. The tricuspid valve is grossly normal.  6. The aortic valve is tricuspid. Moderate calcification of the aortic valve. Aortic valve regurgitation is mild by color flow Doppler. Moderate-severe stenosis of the aortic valve.  7. The inferior vena cava was dilated in size with <50% respiratory variability.  8. The interatrial septum was not assessed.  9. When compared to the prior study: 03/24/2018: LVEF 40-45%, moderate aortic stenosis - mean gradient 30 mmHg.  Pt with LVEF of 35-40% on echo and volume status being managed on HD.  May need to consider work up for TAVR at some point.   On POD 5, pt did have acute blood loss anemia with hypotension.  Given hgb was less than 7, he was transfused one unit of PRBC's.  Pt continues to have patent bypass with +doppler signals present.   On POD 6, pt was feeling much better and eating better as well.  CIR approved pt for admission and is cleared from cardiology standpoint.  No current chest pain.    The remainder of the hospital course consisted of increasing mobilization and increasing intake of solids without difficulty. He also continued HD during hospitalization.   CBC    Component Value Date/Time   WBC 7.8 07/28/2018 0114   RBC 2.17 (L) 07/28/2018 0114   HGB 7.1 (L) 07/28/2018 0114   HGB 9.5 (L) 07/14/2018 0909   HGB 10.8 (L) 03/14/2017 0921   HCT 22.0 (L) 07/28/2018 0114   HCT 29.3 (L) 07/14/2018 0909   HCT 32.5 (L) 03/14/2017 0921   PLT 115 (L) 07/28/2018 0114   PLT 115 (L) 07/14/2018 0909   MCV 101.4 (H) 07/28/2018 0114   MCV 100 (H) 07/14/2018 0909   MCV 106.2 (H) 03/14/2017 0921   MCH 32.7 07/28/2018 0114   MCHC 32.3 07/28/2018 0114   RDW 15.7 (H) 07/28/2018 0114   RDW 15.6 (H) 07/14/2018 0909   RDW 14.6 03/14/2017 0921   LYMPHSABS 1.0 07/23/2018 1849   LYMPHSABS 0.8 (L) 03/14/2017 0921   MONOABS 0.8 07/23/2018 1849   MONOABS 0.6  03/14/2017 0921   EOSABS 0.0 07/23/2018 1849   EOSABS 0.1 03/14/2017 0921   BASOSABS 0.0 07/23/2018 1849   BASOSABS 0.0 03/14/2017 0921    BMET    Component Value Date/Time   NA 134 (L) 07/28/2018 1338   NA 135 07/14/2018 0909   NA 141 03/14/2017 0921   K 5.2 (H) 07/28/2018 1338   K 4.1 03/14/2017 0921   CL 93 (L) 07/28/2018 1338   CO2 22 07/28/2018 1338   CO2 33 (H) 03/14/2017 0921   GLUCOSE 127 (H) 07/28/2018 1338   GLUCOSE 144 (H) 03/14/2017 0921   BUN 75 (H) 07/28/2018 1338   BUN 76 (HH) 07/14/2018 0909   BUN 30.1 (H) 03/14/2017 0921   CREATININE 9.15 (H) 07/28/2018 1338   CREATININE 9.16 (HH) 09/17/2017 0954   CREATININE 8.1 (  HH) 03/14/2017 0921   CALCIUM 9.1 07/28/2018 1338   CALCIUM 9.5 03/14/2017 0921   GFRNONAA 5 (L) 07/28/2018 1338   GFRNONAA 5 (L) 09/17/2017 0954   GFRNONAA 24 (L) 06/30/2014 1528   GFRAA 6 (L) 07/28/2018 1338   GFRAA 6 (L) 09/17/2017 0954   GFRAA 28 (L) 06/30/2014 1528     Discharge Instructions    Discharge patient   Complete by:  As directed    Discharge to CIR today   Discharge disposition:  [place] Not Defined   Discharge patient date:  07/28/2018      Discharge Diagnosis:  critical limb ischemia  Secondary Diagnosis: Patient Active Problem List   Diagnosis Date Noted   PAD (peripheral artery disease) ([place]) 07/22/2018   Shortness of breath 03/25/2018   Chest pain    Dyspnea 03/23/2018   Acute on chronic combined systolic and diastolic CHF (congestive heart failure) ([place]) 11/30/2017   Non-ST elevation (NSTEMI) myocardial infarction ([place])    Elevated troponin 11/25/2017   Respiratory failure ([place])    Neuropathy 03/14/2017   Fever    CKD (chronic kidney disease) stage 5, GFR less than 15 ml/min (HCC)    ESRD (end stage renal disease) on dialysis ([place])    Acute renal failure superimposed on chronic kidney disease (HCC)    Hypervolemia    Acute diastolic (congestive) heart failure  ([place]) 01/23/2016   Fluid overload, unspecified 01/23/2016   Acute pulmonary edema ([place]) 01/23/2016   Skin tear of right forearm without complication 76/54/6503   Iron deficiency anemia 11/22/2015   Nonrheumatic aortic valve stenosis 08/22/2015   Chronic kidney disease with dialysis modality undecided, stage 5 ([place]): Progressive 08/01/2015   B12 deficiency 07/19/2015   Seasonal allergies 06/30/2015   Back pain 03/18/2015   Hip pain 02/18/2015   AL amyloidosis ([place]) 09/25/2014   Anemia of chronic disease 09/25/2014   Abnormal bruising 09/25/2014   Renal hematoma, right 09/08/2014   Chronic diastolic heart failure, NYHA class 1 ([place]) 09/08/2014   Hematuria    Proteinuria    Vitamin D deficiency 01/21/2014   Hypertriglyceridemia 01/21/2014   Essential hypertension, benign 11/06/2013   Dependent edema 11/06/2013   DOE (dyspnea on exertion) 11/06/2013   Other malaise and fatigue 11/06/2013   Past Medical History:  Diagnosis Date   Amyloidosis ([place])    Anemia Dx 2016   Anxiety    "especially with procedure"- 07/21/2018   CAD (coronary artery disease)    a. cardiac cath on 11/25/17 with occlusion of the RCA which could have been his event but the RCA could not be engaged. The RCA filled distally from left to right collaterals. There were no severe stenoses in the left coronary system. Medical therapy recommended, started on plavix.   Chronic combined systolic and diastolic CHF (congestive heart failure) ([place])    a. EF dropped to 35-40% with grade 2 DD by echo 11/2017.   Diarrhea    ESRD (end stage renal disease) ([place])    ESRD- HEMO MWF- [place]   Family history of adverse reaction to anesthesia    "my daughter can't take certain anesthesia agents" (09/08/2014)   GERD (gastroesophageal reflux disease)    Heart murmur    History of blood transfusion 09/08/2014   "got hematoma after renal biopsy & HgB dropped"   Hyperlipidemia Dx 2012    Hypertension Dx 2012   Myocardial infarction ([place]) 11/2017   Neuropathy    Peripheral vascular disease ([place])  Restless legs      Allergies as of 07/28/2018   No Known Allergies     Medication List    TAKE these medications   acetaminophen 500 MG tablet Commonly known as:  TYLENOL Take 500-1,000 mg by mouth daily as needed (pain).   aspirin EC 81 MG tablet Take 1 tablet (81 mg total) by mouth daily.   atorvastatin 80 MG tablet Commonly known as:  LIPITOR Take 80 mg by mouth daily with supper.   cetirizine 10 MG tablet Commonly known as:  ZYRTEC Take 10 mg by mouth daily.   clopidogrel 75 MG tablet Commonly known as:  PLAVIX Take 75 mg by mouth daily with supper.   ethyl chloride spray Apply 1 application topically See admin instructions. Spray on arm immediately prior to dialysis on Monday, Wednesday and Friday   gabapentin 300 MG capsule Commonly known as:  NEURONTIN TAKE 1 CAPSULE BY MOUTH EVERY MORNING AND 3 CAPSULES EACH NIGHT AT BEDTIME What changed:    how much to take  how to take this  when to take this  additional instructions   oxyCODONE 10 mg 12 hr tablet Commonly known as:  OXYCONTIN Take 10 mg by mouth daily as needed (severe pain). What changed:  Another medication with the same name was changed. Make sure you understand how and when to take each.   oxyCODONE 5 MG immediate release tablet Commonly known as:  Oxy IR/ROXICODONE Take 1-2 tablets (5-10 mg total) by mouth every 6 (six) hours as needed for severe pain. What changed:    how much to take  when to take this  reasons to take this   sucroferric oxyhydroxide 500 MG chewable tablet Commonly known as:  VELPHORO Chew 500-1,500 mg by mouth See admin instructions. Take 1 tablet (500 mg) by mouth with a small meal or snack & take 2 or 3 tablets (1000 - 1500 mg) by mouth with large meals   vitamin B-12 1000 MCG tablet Commonly known as:  CYANOCOBALAMIN Take 1,000 mcg by mouth  daily.       Discharge Instructions: [facility] Discharge instructions Lower Extremity Bypass Surgery  Please refer to the following instruction for your post-procedure care. Your surgeon or physician assistant will discuss any changes with you.  Activity  You are encouraged to walk as much as you can. You can slowly return to normal activities during the month after your surgery. Avoid strenuous activity and heavy lifting until your doctor tells you it's OK. Avoid activities such as vacuuming or swinging a golf club. Do not drive until your doctor give the OK and you are no longer taking prescription pain medications. It is also normal to have difficulty with sleep habits, eating and bowel movement after surgery. These will go away with time.  Bathing/Showering  You may shower after you go home. Do not soak in a bathtub, hot tub, or swim until the incision heals completely.  Incision Care  Clean your incision with mild soap and water. Shower every day. Pat the area dry with a clean towel. You do not need a bandage unless otherwise instructed. Do not apply any ointments or creams to your incision. If you have open wounds you will be instructed how to care for them or a visiting nurse may be arranged for you. If you have staples or sutures along your incision they will be removed at your post-op appointment. You may have skin glue on your incision. Do not  peel it off. It will come off on its own in about one week.  Wash the groin wound with soap and water daily and pat dry. (No tub bath-only shower)  Then put a dry gauze or washcloth in the groin to keep this area dry to help prevent wound infection.  Do this daily and as needed.  Do not use Vaseline or neosporin on your incisions.  Only use soap and water on your incisions and then protect and keep dry.  Diet  Resume your normal diet. There are no special food restrictions following this procedure. A low  fat/ low cholesterol diet is recommended for all patients with vascular disease. In order to heal from your surgery, it is CRITICAL to get adequate nutrition. Your body requires vitamins, minerals, and protein. Vegetables are the best source of vitamins and minerals. Vegetables also provide the perfect balance of protein. Processed food has little nutritional value, so try to avoid this.  Medications  Resume taking all your medications unless your doctor or Physician Assistant tells you not to. If your incision is causing pain, you may take over-the-counter pain relievers such as acetaminophen (Tylenol). If you were prescribed a stronger pain medication, please aware these medication can cause nausea and constipation. Prevent nausea by taking the medication with a snack or meal. Avoid constipation by drinking plenty of fluids and eating foods with high amount of fiber, such as fruits, vegetables, and grains. Take Colace 100 mg (an over-the-counter stool softener) twice a day as needed for constipation.  Do not take Tylenol if you are taking prescription pain medications.  Follow Up  Our office will schedule a follow up appointment 2-3 weeks following discharge.  Please call us immediately for any of the following conditions  Severe or worsening pain in your legs or feet while at rest or while walking Increase pain, redness, warmth, or drainage (pus) from your incision site(s)  Fever of 101 degree or higher  The swelling in your leg with the bypass suddenly worsens and becomes more painful than when you were in the hospital  If you have been instructed to feel your graft pulse then you should do so every day. If you can no longer feel this pulse, call the office immediately. Not all patients are given this instruction.   Leg swelling is common after leg bypass surgery.  The swelling should improve over a few months following surgery. To improve the swelling, you may elevate your legs above  the level of your heart while you are sitting or resting. Your surgeon or physician assistant may ask you to apply an ACE wrap or wear compression (TED) stockings to help to reduce swelling.  Reduce your risk of vascular disease  Stop smoking. If you would like help call [facility] at 1-800-QUIT-NOW (727) 829-9769) or [place] at [phone].   Manage your cholesterol  Maintain a desired weight  Control your diabetes weight  Control your diabetes  Keep your blood pressure down   If you have any questions, please call the office at [phone]   Prescriptions given: None given  Disposition: CIR  Patient's condition: is Good  Follow up: 1. Dr. Donzetta Matters in 2-3 weeks   Leontine Locket, PA-C [facility] [phone] 07/28/2018  3:00 PM  - For VQI Registry use ---   Post-op:  Wound infection: No  Graft infection: No  Transfusion: Yes    If yes, 1 units given New Arrhythmia: No Ipsilateral amputation: No, [ ]  Minor, [ ]   BKA, [ ]  AKA Discharge patency: [x ] Primary, [ ]  Primary assisted, [ ]  Secondary, [ ]  Occluded Patency judged by: [x ] Dopper only, [ ]  Palpable graft pulse, []  Palpable distal pulse, [ ]  ABI inc. > 0.15, [ ]  Duplex Discharge ABI: R 2.23, L 2.23 D/C Ambulatory Status: Ambulatory with Assistance  Complications: MI: Yes, [ ]  Troponin only, [ ]  EKG or Clinical CHF: No Resp failure:No, [ ]  Pneumonia, [ ]  Ventilator Chg in renal function: No, [ ]  Inc. Cr > 0.5, [ ]  Temp. Dialysis,  [ ]  Permanent dialysis Stroke: No, [ ]  Minor, [ ]  Major Return to OR: No  Reason for return to OR: [ ]  Bleeding, [ ]  Infection, [ ]  Thrombosis, [ ]  Revision  Discharge medications: Statin use:  yes ASA use:  yes Plavix use:  yes Beta blocker use: no (BP won't tolerate) CCB use:  No ACEI use:   no ARB use:  no Coumadin use: no

## 2018-07-28 NOTE — Procedures (Signed)
Patient was seen on dialysis and the procedure was supervised.  BFR 400  Via AVF BP is  97/58.   Patient appears to be tolerating treatment well  Louis Meckel 07/28/2018

## 2018-07-29 ENCOUNTER — Inpatient Hospital Stay (HOSPITAL_COMMUNITY): Payer: Medicare Other | Admitting: Occupational Therapy

## 2018-07-29 ENCOUNTER — Encounter (HOSPITAL_COMMUNITY): Payer: Self-pay | Admitting: Emergency Medicine

## 2018-07-29 ENCOUNTER — Other Ambulatory Visit: Payer: Self-pay

## 2018-07-29 ENCOUNTER — Inpatient Hospital Stay (HOSPITAL_COMMUNITY): Payer: Medicare Other | Admitting: Physical Therapy

## 2018-07-29 DIAGNOSIS — D62 Acute posthemorrhagic anemia: Secondary | ICD-10-CM

## 2018-07-29 DIAGNOSIS — I5042 Chronic combined systolic (congestive) and diastolic (congestive) heart failure: Secondary | ICD-10-CM

## 2018-07-29 DIAGNOSIS — Z992 Dependence on renal dialysis: Secondary | ICD-10-CM

## 2018-07-29 DIAGNOSIS — D696 Thrombocytopenia, unspecified: Secondary | ICD-10-CM

## 2018-07-29 DIAGNOSIS — I953 Hypotension of hemodialysis: Secondary | ICD-10-CM

## 2018-07-29 DIAGNOSIS — N186 End stage renal disease: Secondary | ICD-10-CM

## 2018-07-29 MED ORDER — CHLORHEXIDINE GLUCONATE CLOTH 2 % EX PADS: 6.0000 | MEDICATED_PAD | Freq: Every day | CUTANEOUS | Status: DC

## 2018-07-29 NOTE — Progress Notes (Signed)
Occupational Therapy Session Note  Patient Details  Name: Jose Garrett MRN: [id number] Date of Birth: 1951/03/08  Today's Date: 07/29/2018 OT Individual Time: 3254-9826 OT Individual Time Calculation (min): 50 min    Short Term Goals: Week 1:  OT Short Term Goal 1 (Week 1): Pt will complete toilet transfer with min A and LRAD OT Short Term Goal 2 (Week 1): Pt will complete 1 step of LB dressing task OT Short Term Goal 3 (Week 1): Pt will tolerate standing for 2 minutes in preparation for BADL tasks  Skilled Therapeutic Interventions/Progress Updates:    1:1 In in recliner when arrived. Left LE very swollen. Applied xx large TEDS on (total ).  With maxi slides underneath left LE; performed heel slides with focus on holding end stretches both knee extension and flexion with dorsiflexion.  Pt requested to try to ambulate. Pt ambulated ~ 10-15 feet with RW at a very slow pace with pain with weight bearing. After 10 feet requested to sit.  evaluated LE and educated on icing it on/ off every 20 min. LEft resting in the chair with RN. RN came with pain meds pt had requested.   Therapy Documentation Precautions:  Precautions Precautions: Fall Restrictions Weight Bearing Restrictions: No General: General OT Amount of Missed Time: 10 Minutes due to pain Pain: Ongoing pain in left lower LE- increased with activity- allowed for rest and iced LE afterwards  Therapy/Group: Individual Therapy  Willeen Cass [facility] 07/29/2018, 2:23 PM

## 2018-07-29 NOTE — Evaluation (Signed)
Physical Therapy Assessment and Plan  Patient Details  Name: Jose Garrett MRN: [id number] Date of Birth: 12-03-1950  PT Diagnosis: Abnormality of gait, Coordination disorder, Difficulty walking, Impaired sensation and Muscle weakness Rehab Potential: Good ELOS: 12-14 days   Today's Date: 07/29/2018 PT Individual Time: 1430-1530 PT Individual Time Calculation (min): 60 min    Problem List:  Patient Active Problem List   Diagnosis Date Noted  . Thrombocytopenia ([place])   . Chronic combined systolic and diastolic congestive heart failure ([place])   . ESRD on dialysis ([place])   . Hemodialysis-associated hypotension   . Acute blood loss anemia   . Debility 07/28/2018  . PAD (peripheral artery disease) ([place]) 07/22/2018  . Shortness of breath 03/25/2018  . Chest pain   . Dyspnea 03/23/2018  . Acute on chronic combined systolic and diastolic CHF (congestive heart failure) ([place]) 11/30/2017  . Non-ST elevation (NSTEMI) myocardial infarction ([place])   . Elevated troponin 11/25/2017  . Respiratory failure ([place])   . Neuropathy 03/14/2017  . Fever   . CKD (chronic kidney disease) stage 5, GFR less than 15 ml/min (HCC)   . ESRD (end stage renal disease) on dialysis ([place])   . Acute renal failure superimposed on chronic kidney disease ([place])   . Hypervolemia   . Acute diastolic (congestive) heart failure ([place]) 01/23/2016  . Fluid overload, unspecified 01/23/2016  . Acute pulmonary edema ([place]) 01/23/2016  . Skin tear of right forearm without complication 15/17/6160  . Iron deficiency anemia 11/22/2015  . Nonrheumatic aortic valve stenosis 08/22/2015  . Chronic kidney disease with dialysis modality undecided, stage 5 ([place]): Progressive 08/01/2015  . B12 deficiency 07/19/2015  . Seasonal allergies 06/30/2015  . Back pain 03/18/2015  . Hip pain 02/18/2015  . AL amyloidosis ([place]) 09/25/2014  . Anemia of chronic disease 09/25/2014  . Abnormal bruising 09/25/2014  . Renal hematoma, right 09/08/2014   . Chronic diastolic heart failure, NYHA class 1 ([place]) 09/08/2014  . Hematuria   . Proteinuria   . Vitamin D deficiency 01/21/2014  . Hypertriglyceridemia 01/21/2014  . Essential hypertension, benign 11/06/2013  . Dependent edema 11/06/2013  . DOE (dyspnea on exertion) 11/06/2013  . Other malaise and fatigue 11/06/2013    Past Medical History:  Past Medical History:  Diagnosis Date  . Amyloidosis ([place])   . Anemia Dx 2016  . Anxiety    "especially with procedure"- 07/21/2018  . CAD (coronary artery disease)    a. cardiac cath on 11/25/17 with occlusion of the RCA which could have been his event but the RCA could not be engaged. The RCA filled distally from left to right collaterals. There were no severe stenoses in the left coronary system. Medical therapy recommended, started on plavix.  . Chronic combined systolic and diastolic CHF (congestive heart failure) ([place])    a. EF dropped to 35-40% with grade 2 DD by echo 11/2017.  [name] Diarrhea   . ESRD (end stage renal disease) ([place])    ESRD- HEMO MWF- [employer]  . Family history of adverse reaction to anesthesia    "my daughter can't take certain anesthesia agents" (09/08/2014)  . GERD (gastroesophageal reflux disease)   . Heart murmur   . History of blood transfusion 09/08/2014   "got hematoma after renal biopsy & HgB dropped"  . Hyperlipidemia Dx 2012  . Hypertension Dx 2012  . Myocardial infarction ([place]) 11/2017  . Neuropathy   . Peripheral vascular disease ([place])   . Restless legs    Past Surgical History:  Past Surgical History:  Procedure Laterality Date  . ABDOMINAL AORTOGRAM W/LOWER EXTREMITY Bilateral 07/16/2018   Procedure: ABDOMINAL AORTOGRAM W/LOWER EXTREMITY;  Surgeon: Wellington Hampshire, MD;  Location: [facility];  Service: Cardiovascular;  Laterality: Bilateral;  . AV FISTULA PLACEMENT Right 12/27/2015   Procedure: Right Arm ARTERIOVENOUS (AV) FISTULA CREATION;  Surgeon: Angelia Mould, MD;  Location: [facility];  Service: Vascular;  Laterality: Right;  . COLONOSCOPY    . FEMORAL-POPLITEAL BYPASS GRAFT Left 07/22/2018   Procedure: left FEMORAL TO BELOW KNEE POPLITEAL ARTERY BYPASS GREAFT using 32m removable ring propaten gore graft;  Surgeon: CWaynetta Sandy MD;  [facility]  Service: Vascular;  Laterality: Left;  . IR GENERIC HISTORICAL  01/27/2016   IR FLUORO GUIDE CV LINE RIGHT 01/27/2016 DArne Cleveland MD [facility]  . IR GENERIC HISTORICAL  01/27/2016   IR [place] VASC ACCESS RIGHT 01/27/2016 DArne Cleveland MD [facility]  . LAPAROSCOPIC CHOLECYSTECTOMY  03/2011  . LEFT HEART CATH AND CORONARY ANGIOGRAPHY N/A 11/25/2017   Procedure: LEFT HEART CATH AND CORONARY ANGIOGRAPHY;  Surgeon: JMartinique Peter M, MD;  [facility];  Service: Cardiovascular;  Laterality: N/A;  . LEFT HEART CATH AND CORONARY ANGIOGRAPHY N/A 07/24/2018   Procedure: LEFT HEART CATH AND CORONARY ANGIOGRAPHY;  Surgeon: CSherren Mocha MD;  [facility];  Service: Cardiovascular;  Laterality: N/A;  . RENAL BIOPSY, PERCUTANEOUS Right 09/08/2014  . RIGHT/LEFT HEART CATH AND CORONARY ANGIOGRAPHY N/A 03/25/2018   Procedure: RIGHT/LEFT HEART CATH AND CORONARY ANGIOGRAPHY;  Surgeon: JMartinique Peter M, MD;  [facility];  Service: Cardiovascular;  Laterality: N/A;  . TONSILLECTOMY  ~ 1960  . ULTRASOUND GUIDANCE FOR VASCULAR ACCESS  03/25/2018   Procedure: Ultrasound Guidance For Vascular Access;  Surgeon: JMartinique Peter M, MD;  [facility];  Service: Cardiovascular;;    Assessment & Plan Clinical Impression: Patient is a 68year old right-handed male with history of remote tobacco abuse 7 years ago. End-stage renal disease due to cardiorenal syndrome in the setting of AL amyloidosis with hemodialysis, aortic stenosis, hypertension, combined diastolic and systolic congestive heart failure followed by Dr. MAngelena Form Per chart review he lives alone used a cane  for ambulation. 1 level home 3 steps to entry. His daughter plans to stay with him on discharge and assist as needed. Presented 07/22/2018 with progressive discoloration and ischemia in his toes on the left side. Underwent noninvasive studies which detected noncompressible vessels with no flow detected in the left toes and dampened flow in the right toes. Underwent outpatient PV angiogram showing occlusion of bilateral SFAs but no gangrenous changes on the left side. He was felt to be a candidate for surgical intervention. Underwent left common femoral to below-knee popliteal artery bypass 07/22/2018 per Dr. BServando Snare Renal service follow-up for hemodialysis ongoing. Hospital course with hypotension. EKG showed ST with RBBB suspect non-STEMI. Cardiology services consulted noted troponin 0.09, hemoglobin 6.8-7.3, potassium 5.9. He was transfused 1 unit packed red blood cells. Underwent cardiac catheterization 07/24/2018 showed chronic total occlusion of RCA with left to right collaterals. Echocardiogram with ejection fraction of 40% moderately increased left ventricular wall thickness with reduced systolic function. Initially required IV heparin transition to aspirin and Plavix. Close monitoring for asymptomatic hypotension. Subcutaneous heparin was later initiated for DVT prophylaxis. Patient transferred to CIR on 07/28/2018 .   Patient currently requires mod with mobility secondary to muscle weakness and muscle joint tightness, decreased cardiorespiratoy endurance and  decreased standing balance and decreased balance strategies.  Prior to hospitalization, patient was modified independent  with mobility and lived with Alone in a House home.  Home access is 1Stairs to enter.  Patient will benefit from skilled PT intervention to maximize safe functional mobility, minimize fall risk and decrease caregiver burden for planned discharge home with intermittent assist (nearly 24/7 assist between  multiple family members).  Anticipate patient will benefit from follow up [place] at discharge.  PT - End of Session Activity Tolerance: Tolerates < 10 min activity, no significant change in vital signs Endurance Deficit: Yes Endurance Deficit Description: decreased, falling asleep in seated during rest breaks  PT Assessment Rehab Potential (ACUTE/IP ONLY): Good PT Barriers to Discharge: Wound Care;Medical stability PT Barriers to Discharge Comments: Pt w/ supportive family, daughter able to arrange for nearly 24/7 assist if needed PT Patient demonstrates impairments in the following area(s): Balance;Pain;Safety;Edema;Endurance;Sensory;Skin Integrity PT Transfers Functional Problem(s): Bed Mobility;Bed to Chair;Car;Furniture;Floor PT Locomotion Functional Problem(s): Stairs;Ambulation PT Plan PT Intensity: Minimum of 1-2 x/day ,45 to 90 minutes PT Frequency: 5 out of 7 days PT Duration Estimated Length of Stay: 12-14 days PT Treatment/Interventions: Ambulation/gait training;Discharge planning;DME/adaptive equipment instruction;Functional mobility training;Pain management;Psychosocial support;Splinting/orthotics;Therapeutic Activities;UE/LE Strength taining/ROM;UE/LE Coordination activities;Therapeutic Exercise;Stair training;Skin care/wound management;Balance/vestibular training;Community reintegration;Disease management/prevention;Functional electrical stimulation;Neuromuscular re-education;Patient/family education PT Transfers Anticipated Outcome(s): mod/i  PT Locomotion Anticipated Outcome(s): mod/i short distances w/ LRAD PT Recommendation Follow Up Recommendations: Home health PT Patient destination: Home(daughter's house) Equipment Recommended: To be determined  Skilled Therapeutic Intervention  Pt in recliner and agreeable to therapy, pain as detailed below. Pt slow to respond and processing questions, keeping eyes closed during interview, suspect 2/2 taking pain medications prior to  session. Sit<>stands to RW w/ mod assist and mod assist stand pivot transfer to w/c. Pt self-propelled w/c to therapy gym w/ BUEs. Ambulated w/ RW, 25' w/ min assist. Mod assist sit<>stands to RW but min assist once stabilized in stance to ambulate straight. Returned to room total assist in w/c, stand pivot w/ min-mod assist back to EOB. Bed mobility performed w/ supervision. Instructed pt in results of PT evaluation as detailed below, PT POC, rehab potential, rehab goals, and discharge recommendations. Called daughter [name]) to update her also, per pt's request. Confirmed home set-up and plan to d/c to daughter's house. Daughter very appreciative of update. Ended session in supine, all needs in reach.  PT Evaluation Precautions/Restrictions Precautions Precautions: Fall Restrictions Weight Bearing Restrictions: No General   Vital SignsTherapy Vitals Temp: 98 F (36.7 C) Pulse Rate: 84 Resp: 16 BP: (!) 84/53 Patient Position (if appropriate): Sitting Oxygen Therapy SpO2: 99 % O2 Device: Room Air Pain Pain Assessment Pain Scale: 0-10 Pain Score: 4  Pain Type: Acute pain;Surgical pain Pain Location: Leg Pain Orientation: Left Pain Descriptors / Indicators: Sharp;Shooting Pain Frequency: Constant Pain Onset: On-going Patients Stated Pain Goal: 4 Pain Intervention(s): Medication (See eMAR) Home Living/Prior Functioning Home Living Available Help at Discharge: Family;Available PRN/intermittently Type of Home: House Home Access: Stairs to enter [employer] of Steps: 1 Entrance Stairs-Rails: Right Home Layout: One level Bathroom Shower/Tub: Chiropodist: Standard Additional Comments: Plans to go stay with middle daughter- her house is described above (daughter-Erin)   Lives With: Alone Prior Function Level of Independence: Independent with basic ADLs;Independent with gait;Independent with homemaking with ambulation;Independent with transfers;Requires  assistive device for independence(SPC)  Able to Take Stairs?: Yes Driving: Yes Vocation: Retired Comments: Uses a cane to walk outside of the house, drives, drives himself to/from  dialysis Vision/Perception  Perception Perception: Within Functional Limits Praxis Praxis: Intact  Cognition Overall Cognitive Status: Within Functional Limits for tasks assessed Arousal/Alertness: Awake/alert Orientation Level: Oriented X4 Memory: Appears intact Awareness: Appears intact Problem Solving: Appears intact Safety/Judgment: Appears intact Sensation Sensation Light Touch: Impaired by gross assessment(diminished distally, LLE>RLE, 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) Coordination Gross Motor Movements are Fluid and Coordinated: No Coordination and Movement Description: limited 2/2 guarding and pain in LLE w/ all movements Motor  Motor Motor: Within Functional Limits Motor - Skilled Clinical Observations: generalized weakness  Mobility Bed Mobility Bed Mobility: Rolling Right;Supine to Sit;Rolling Left;Sit to Supine Rolling Right: Supervision/verbal cueing Rolling Left: Supervision/Verbal cueing Supine to Sit: Supervision/Verbal cueing Sit to Supine: Supervision/Verbal cueing Transfers Transfers: Sit to Stand;Stand to Sit;Stand Pivot Transfers Sit to Stand: Moderate Assistance - Patient 50-74% Stand to Sit: Moderate Assistance - Patient 50-74% Stand Pivot Transfers: Moderate Assistance - Patient 50 - 74% Stand Pivot Transfer Details: Manual facilitation for weight shifting;Manual facilitation for weight bearing;Manual facilitation for placement;Tactile cues for posture;Tactile cues for initiation;Verbal cues for sequencing Transfer (Assistive device): Rolling walker Locomotion  Gait Ambulation: Yes Gait Assistance: Minimal Assistance - Patient > 75% Gait Distance (Feet): 25 Feet Assistive device: Rolling walker Gait Assistance Details: Manual facilitation for  placement;Manual facilitation for weight shifting;Manual facilitation for weight bearing;Tactile cues for posture Gait Gait: Yes Gait Pattern: Impaired Gait Pattern: Decreased step length - right;Decreased step length - left;Step-to pattern;Left foot flat;Shuffle;Poor foot clearance - left;Wide base of support Gait velocity: decreased Stairs / Additional Locomotion Stairs: No Wheelchair Mobility Wheelchair Mobility: Yes Wheelchair Assistance: Chartered loss adjuster: Both upper extremities Wheelchair Parts Management: Needs assistance Distance: 150'  Trunk/Postural Assessment  Cervical Assessment Cervical Assessment: Within Functional Limits Thoracic Assessment Thoracic Assessment: Within Functional Limits Lumbar Assessment Lumbar Assessment: Within Functional Limits Postural Control Postural Control: Within Functional Limits  Balance Balance Balance Assessed: Yes Static Sitting Balance Static Sitting - Balance Support: No upper extremity supported;Feet supported Static Sitting - Level of Assistance: 5: Stand by assistance Dynamic Sitting Balance Dynamic Sitting - Balance Support: No upper extremity supported;Feet supported Dynamic Sitting - Level of Assistance: 5: Stand by assistance Static Standing Balance Static Standing - Balance Support: During functional activity;Bilateral upper extremity supported Static Standing - Level of Assistance: 4: Min assist Dynamic Standing Balance Dynamic Standing - Balance Support: During functional activity;No upper extremity supported Dynamic Standing - Level of Assistance: 3: Mod assist Extremity Assessment  RLE Assessment RLE Assessment: Exceptions to [facility] Passive Range of Motion (PROM) Comments: WFL, increased stiffness globally  General Strength Comments: globally 4/5  LLE Assessment Passive Range of Motion (PROM) Comments: Limited globally 2/2 pain and swelling  General Strength Comments: unable to tolerate  formal testing 2/2 pain, moving extremity against gravity, will continue to assess     Refer to Care Plan for Long Term Goals  Recommendations for other services: None   Discharge Criteria: Patient will be discharged from PT if patient refuses treatment 3 consecutive times without medical reason, if treatment goals not met, if there is a change in medical status, if patient makes no progress towards goals or if patient is discharged from hospital.  The above assessment, treatment plan, treatment alternatives and goals were discussed and mutually agreed upon: by patient  Arienna Benegas K Laroy Mustard 07/29/2018, 3:03 PM

## 2018-07-29 NOTE — IPOC Note (Signed)
Overall Plan of Care [facility]) Patient Details Name: Jose Garrett MRN: [id number] DOB: 08/19/1950  Admitting Diagnosis: [facility] Problems: Active Problems:   Debility   Thrombocytopenia ([place])   Chronic combined systolic and diastolic congestive heart failure ([place])   ESRD on dialysis ([place])   Hemodialysis-associated hypotension   Acute blood loss anemia     Functional Problem List: Nursing Edema, Safety, Pain, Skin Integrity  PT Balance, Pain, Safety, Edema, Endurance, Sensory, Skin Integrity  OT Balance, Endurance, Pain, Motor  SLP    TR         Basic ADL's: OT Eating, Grooming, Bathing, Dressing, Toileting     Advanced  ADL's: OT       Transfers: PT Bed Mobility, Bed to Chair, Car, Furniture, Floor  OT Toilet, Tub/Shower     Locomotion: PT Stairs, Ambulation     Additional Impairments: OT None  SLP        TR      Anticipated Outcomes Item Anticipated Outcome  Self Feeding Independent  Swallowing      Basic self-care  Independent  Toileting  Mod I   Bathroom Transfers Supervision  Bowel/Bladder  Patient to continue to be continent of bowel and bladder during admission  Transfers  mod/i   Locomotion  mod/i short distances w/ LRAD  Communication     Cognition     Pain  Patient will be free from pain or pain less than 3 during admission  Safety/Judgment  Patient will be free from falls and adhere to safety plan during admission   Therapy Plan: PT Intensity: Minimum of 1-2 x/day ,45 to 90 minutes PT Frequency: 5 out of 7 days PT Duration Estimated Length of Stay: 12-14 days OT Intensity: Minimum of 1-2 x/day, 45 to 90 minutes OT Frequency: 5 out of 7 days OT Duration/Estimated Length of Stay: 12-14 days     Due to the current state of emergency, patients may not be receiving their 3-hours of Medicare-mandated therapy.   Team Interventions: Nursing Interventions Patient/Family Education, Pain Management, Disease  Management/Prevention, Medication Management  PT interventions Ambulation/gait training, Discharge planning, DME/adaptive equipment instruction, Functional mobility training, Pain management, Psychosocial support, Splinting/orthotics, Therapeutic Activities, UE/LE Strength taining/ROM, UE/LE Coordination activities, Therapeutic Exercise, Stair training, Skin care/wound management, Training and development officer, Community reintegration, Disease management/prevention, Technical sales engineer stimulation, Neuromuscular re-education, Patient/family education  OT Interventions Training and development officer, Community reintegration, Discharge planning, Disease mangement/prevention, Engineer, drilling, Functional mobility training, Pain management, Patient/family education, Psychosocial support, Self Care/advanced ADL retraining, Therapeutic Activities, Therapeutic Exercise, UE/LE Strength taining/ROM, UE/LE Coordination activities  SLP Interventions    TR Interventions    SW/CM Interventions Discharge Planning, Psychosocial Support, Patient/Family Education   Barriers to Discharge MD  Medical stability, Wound care and Hemodialysis  Nursing      PT Wound Care, Medical stability Pt w/ supportive family, daughter able to arrange for nearly 24/7 assist if needed  OT      SLP      SW       Team Discharge Planning: Destination: PT-Home(daughter's house) ,OT- Home , SLP-  Projected Follow-up: PT-Home health PT, OT-  Home health OT, SLP-  Projected Equipment Needs: PT-To be determined, OT- To be determined, SLP-  Equipment Details: PT- , OT-  Patient/family involved in discharge planning: PT- Family member/caregiver, Patient,  OT-Patient, SLP-   MD ELOS: 10-14 days. Medical Rehab Prognosis:  Good Assessment: 68 year old right-handed male with history of remote tobacco abuse 7 years ago. End-stage renal disease due to  cardiorenal syndrome in the setting of AL amyloidosis with hemodialysis,  aortic stenosis, hypertension, combined diastolic and systolic congestive heart failure followed by Dr. Angelena Form. Per chart review he lives alone used a cane for ambulation. 1 level home 3 steps to entry. His daughter plans to stay with him on discharge and assist as needed. Presented 07/22/2018 with progressive discoloration and ischemia in his toes on the left side. Underwent noninvasive studies which detected noncompressible vessels with no flow detected in the left toes and dampened flow in the right toes. Underwent outpatient PV angiogram showing occlusion of bilateral SFAs but no gangrenous changes on the left side. He was felt to be a candidate for surgical intervention. Underwent left common femoral to below-knee popliteal artery bypass 07/22/2018 per Dr. Servando Snare. Renal service follow-up for hemodialysis ongoing. Hospital course with hypotension. EKG showed ST with RBBB suspect non-STEMI. Cardiology services consulted noted troponin 0.09, hemoglobin 6.8-7.3, potassium 5.9. He was transfused 1 unit packed red blood cells. Underwent cardiac catheterization 07/24/2018 showed chronic total occlusion of RCA with left to right collaterals. Echocardiogram with ejection fraction of 40% moderately increased left ventricular wall thickness with reduced systolic function. Initially required IV heparin transition to aspirin and Plavix. Close monitoring for asymptomatic hypotension.  Patient with resultant functional deficits with mobility, transfers, endurance, self-care.  We will set goals for Mod I for most tasks with PT/OT.  See Team Conference Notes for weekly updates to the plan of care

## 2018-07-29 NOTE — Progress Notes (Signed)
[facility] PHYSICAL MEDICINE & REHABILITATION PROGRESS NOTE  Subjective/Complaints: Patient seen laying in bed this morning.  He states he slept well overnight.  He states he is ready to begin therapies today.  ROS: Denies CP, shortness of breath, nausea, vomiting, diarrhea.  Objective: Vital Signs: Blood pressure (!) 85/55, pulse 92, temperature 99.9 F (37.7 C), temperature source Oral, resp. rate 18, height 5\' 10"  (1.778 m), weight 91.3 kg, SpO2 100 %. No results found. Recent Labs    07/27/18 0430 07/28/18 0114  WBC 9.0 7.8  HGB 6.8* 7.1*  HCT 21.1* 22.0*  PLT 125* 115*   Recent Labs    07/28/18 1338  NA 134*  K 5.2*  CL 93*  CO2 22  GLUCOSE 127*  BUN 75*  CREATININE 9.15*  CALCIUM 9.1    Physical Exam: BP (!) 85/55   Pulse 92   Temp 99.9 F (37.7 C) (Oral)   Resp 18   Ht 5\' 10"  (1.778 m)   Wt 91.3 kg   SpO2 100%   BMI 28.88 kg/m  Constitutional: No distress . Vital signs reviewed. HENT: Normocephalic.  Atraumatic. Eyes: EOMI. No discharge. Cardiovascular: No JVD. Respiratory: Normal effort. GI: Non-distended. Musculoskeletal: Left lower extremity edema Neurological: Alert and oriented Follows commands.  Motor: Bilateral upper extremities 5/5 proximal distal Right lower extremity: 4/5 proximal distal Left lower extremity: Hip flexion, knee extension 2/5, ankle dorsiflexion 4-/5 Sensation intact light touch Skin: Skin iswarm. He isnot diaphoretic. Femoropopliteal bypass site C/D/I  Left with ischemia on toes  Psychiatric: He has a normal mood and affect. Hisbehavior is normal.Judgmentand thought contentnormal.   Assessment/Plan: 1. Functional deficits secondary to debility which require 3+ hours per day of interdisciplinary therapy in a comprehensive inpatient rehab setting.  Physiatrist is providing close team supervision and 24 hour management of active medical problems listed below.  Physiatrist and rehab team continue to assess  barriers to discharge/monitor patient progress toward functional and medical goals  Care Tool:  Bathing    Body parts bathed by patient: Right arm, Left arm, Chest, Abdomen, Front perineal area, Left upper leg, Right upper leg, Face   Body parts bathed by helper: Buttocks, Right lower leg Body parts n/a: Left lower leg   Bathing assist Assist Level: Moderate Assistance - Patient 50 - 74%     Upper Body Dressing/Undressing Upper body dressing   What is the patient wearing?: Pull over shirt    Upper body assist Assist Level: Minimal Assistance - Patient > 75%    Lower Body Dressing/Undressing Lower body dressing      What is the patient wearing?: Pants     Lower body assist Assist for lower body dressing: Maximal Assistance - Patient 25 - 49%     Toileting Toileting    Toileting assist Assist for toileting: Moderate Assistance - Patient 50 - 74%     Transfers Chair/bed transfer  Transfers assist     Chair/bed transfer assist level: Moderate Assistance - Patient 50 - 74%     Locomotion Ambulation   Ambulation assist              Walk 10 feet activity   Assist           Walk 50 feet activity   Assist           Walk 150 feet activity   Assist           Walk 10 feet on uneven surface  activity   Assist  Wheelchair     Assist               Wheelchair 50 feet with 2 turns activity    Assist            Wheelchair 150 feet activity     Assist            Medical Problem List and Plan: 1.Debilitysecondary to PAD status post left common femoral to below-knee popliteal artery bypass 5/78/4696 complicated by non-STEMI  Begin CIR evaluations   Notes reviewed- left lower extremity ischemic changes, labs reviewed 2. Antithrombotics: -DVT/anticoagulation:Subcutaneous heparin -antiplatelet therapy: Aspirin 81 mg daily, Plavix 75 mg daily 3. Pain Management:Neurontin 300 mg  every evening, oxycodone as needed 4. Mood:Provide emotional support -antipsychotic agents: N/A 5. Neuropsych: This patientiscapable of making decisions on hisown behalf. 6. Skin/Wound Care:Routine skin checks. Local skin care, protection to necrotic areas along toes of left foot.   Will need to monitor closely for left lower extremity compartment syndrome-very tight at present 7. Fluids/Electrolytes/Nutrition: Routine in and outs 8. Acute blood loss anemia. Continue Aranesp  Hemoglobin 7.1 on 5/18  Continue to monitor 9.Hypotension/aortic stenosis.   Monitor with increased mobility 10. End-stage renal disease. Continue hemodialysis as directed.  Recs per nephro  11. Hyperlipidemia. Lipitor 12. Chronic combined diastolic systolic congestive heart failure. Monitor for any signs of fluid overload Filed Weights   07/29/18 0520  Weight: 91.3 kg   13.Constipation. Laxative assistance 14.  Thrombocytopenia  Platelets 115 on 5/18  Continue to monitor  LOS: 1 days A FACE TO FACE EVALUATION WAS PERFORMED  Ankit Lorie Phenix 07/29/2018, 9:35 AM

## 2018-07-29 NOTE — Progress Notes (Signed)
Inpatient Rehabilitation  Patient information reviewed and entered into eRehab system by Natavia Sublette M. Ranessa Kosta, M.A., CCC/SLP, PPS Coordinator.  Information including medical coding, functional ability and quality indicators will be reviewed and updated through discharge.    

## 2018-07-29 NOTE — Progress Notes (Signed)
[facility] Progress Note   Subjective:  No new c/o's.    Objective Vitals:   07/28/18 1851 07/28/18 2012 07/29/18 0520  BP: (!) 85/53 (!) 82/60 (!) 85/55  Pulse: 98 100 92  Resp: 18 19 18   Temp: 98.2 F (36.8 C) 99.3 F (37.4 C) 99.9 F (37.7 C)  TempSrc: Oral Oral Oral  SpO2:  100%   Weight:   91.3 kg  Height:   5\' 10"  (1.778 m)    Physical Exam General: WNWD elderly male NAD  Heart: RRR Lungs: CTAB Abdomen: soft NTND  Extremities: Left LE incision clean +pitting edema LLE. No edema RLE  Dialysis Access: R forearm AVF +bruit     [facility] MWF 4h  400/800    86kg  2/2   P4   R AVF  Hep ? Sensipar 60 TIW Calcitriol 1.25 TIW Mircera 50 q 2 weeks (last 4/29)  Assessment/Plan: 1. ESRD - Usually MWF.  HD tomorrow  2 Hyperkalemia -  Mild, follow  3. PAD with critical limb ischemia - left lower extremity  - s/p L fem-pop BPG  4.  Ischemic cardiomyopathy / severe AS / EF 35-40% - up 5kg, will try to get down to ~ 89kg w/ next HD - avoid excessive preload reduction per cards  5.  Anemia  - Secondary in part to ESRD as well as acute blood loss from procedure  - on aranesp and nulecit -  inc both doses - Transfuse 1 unit PRBC's on 5/13- and on 5/17  6.  Secondary hyperparathyroidism, metabolic bone disease  - On velphoro,  Sensipar, calcitriol    7. Elevated troponin- s/p cath with severe single vessel CAD chronic total chronic occlusion of RCA. Medical therapy recommended. No targets for PCI.   [facility] 07/29/2018, 10:19 AM        Additional Objective Labs: Basic Metabolic Panel: Recent Labs  Lab 07/24/18 0314 07/25/18 0520 07/26/18 0233 07/28/18 1338  NA 137 134* 133* 134*  K 5.4* 4.9 5.4* 5.2*  CL 93* 92* 94* 93*  CO2 28 25 22 22   GLUCOSE 135* 113* 113* 127*  BUN 29* 59* 84* 75*  CREATININE 6.24* 9.08* 10.83* 9.15*  CALCIUM 9.5 9.2 8.7* 9.1  PHOS 7.1* 8.6*  --  7.6*   CBC: Recent Labs  Lab  07/23/18 1849 07/24/18 0314 07/25/18 0520 07/26/18 0233 07/27/18 0430 07/28/18 0114  WBC 9.6 10.8* 9.0 7.6 9.0 7.8  NEUTROABS 7.7  --   --   --   --   --   HGB 7.4* 8.0* 7.3* 7.2* 6.8* 7.1*  HCT 21.5* 24.0* 21.9* 21.4* 21.1* 22.0*  MCV 94.3 96.8 97.3 96.0 100.5* 101.4*  PLT 122* 130* 124* 117* 125* 115*   Blood Culture    Component Value Date/Time   SDES URINE, RANDOM 02/01/2016 1707   SPECREQUEST NONE 02/01/2016 1707   CULT (A) 02/01/2016 1707    10,000 COLONIES/mL PSEUDOMONAS AERUGINOSA 40,000 COLONIES/mL ENTEROBACTER AEROGENES    REPTSTATUS 02/04/2016 FINAL 02/01/2016 1707     Medications: . sodium chloride    . sodium chloride    . [START ON 07/30/2018] ferric gluconate (FERRLECIT/NULECIT) IV     . aspirin EC  81 mg Oral Daily  . atorvastatin  80 mg Oral Q supper  . [START ON 07/30/2018] calcitRIOL  1.25 mcg Oral Q M,W,F-HD  . [START ON 07/30/2018] cinacalcet  60 mg Oral Q M,W,F-HD  . clopidogrel  75 mg Oral Q supper  . [  START ON 07/30/2018] darbepoetin (ARANESP) injection - DIALYSIS  200 mcg Intravenous Q Wed-HD  . docusate sodium  100 mg Oral Daily  . gabapentin  300 mg Oral q morning - 10a  . heparin  5,000 Units Subcutaneous Q8H  . loratadine  10 mg Oral Daily  . pantoprazole  40 mg Oral Daily  . sucroferric oxyhydroxide  1,000 mg Oral TID WC  . sucroferric oxyhydroxide  500 mg Oral With snacks  . vitamin B-12  1,000 mcg Oral Daily

## 2018-07-29 NOTE — Progress Notes (Signed)
Renal Navigator notified OP HD clinic/[facility] of plan for hospital discharge today with admission to CIR. Renal Navigator will follow patient for discharge from CIR and notify OP HD clinic of when they can expect patient to return to clinic for HD treatment in order to provide continuity of care.  Alphonzo Cruise Renal Navigator [phone]

## 2018-07-30 ENCOUNTER — Inpatient Hospital Stay (HOSPITAL_COMMUNITY): Payer: Medicare Other

## 2018-07-30 ENCOUNTER — Inpatient Hospital Stay (HOSPITAL_COMMUNITY): Payer: Medicare Other | Admitting: Physical Therapy

## 2018-07-30 ENCOUNTER — Encounter: Payer: Self-pay | Admitting: Neurology

## 2018-07-30 ENCOUNTER — Ambulatory Visit: Payer: Medicare Other | Admitting: Neurology

## 2018-07-30 DIAGNOSIS — D649 Anemia, unspecified: Secondary | ICD-10-CM

## 2018-07-30 LAB — RENAL FUNCTION PANEL
Albumin: 2.8 g/dL — ABNORMAL LOW (ref 3.5–5.0)
Anion gap: 16 — ABNORMAL HIGH (ref 5–15)
BUN: 64 mg/dL — ABNORMAL HIGH (ref 8–23)
CO2: 23 mmol/L (ref 22–32)
Calcium: 9 mg/dL (ref 8.9–10.3)
Chloride: 95 mmol/L — ABNORMAL LOW (ref 98–111)
Creatinine, Ser: 8.62 mg/dL — ABNORMAL HIGH (ref 0.61–1.24)
GFR calc Af Amer: 7 mL/min — ABNORMAL LOW (ref >60)
GFR calc non Af Amer: 6 mL/min — ABNORMAL LOW (ref >60)
Glucose, Bld: 121 mg/dL — ABNORMAL HIGH (ref 70–99)
Phosphorus: 7.1 mg/dL — ABNORMAL HIGH (ref 2.5–4.6)
Potassium: 5.3 mmol/L — ABNORMAL HIGH (ref 3.5–5.1)
Sodium: 134 mmol/L — ABNORMAL LOW (ref 135–145)

## 2018-07-30 LAB — PREPARE RBC (CROSSMATCH)

## 2018-07-30 LAB — CBC
HCT: 21.3 % — ABNORMAL LOW (ref 39.0–52.0)
Hemoglobin: 6.9 g/dL — CL (ref 13.0–17.0)
MCH: 33.7 pg (ref 26.0–34.0)
MCHC: 32.4 g/dL (ref 30.0–36.0)
MCV: 103.9 fL — ABNORMAL HIGH (ref 80.0–100.0)
Platelets: 120 10*3/uL — ABNORMAL LOW (ref 150–400)
RBC: 2.05 MIL/uL — ABNORMAL LOW (ref 4.22–5.81)
RDW: 17.1 % — ABNORMAL HIGH (ref 11.5–15.5)
WBC: 7.9 10*3/uL (ref 4.0–10.5)
nRBC: 0.5 % — ABNORMAL HIGH (ref 0.0–0.2)

## 2018-07-30 MED ORDER — DARBEPOETIN ALFA 200 MCG/0.4ML IJ SOSY
PREFILLED_SYRINGE | INTRAMUSCULAR | Status: AC
  Administered 2018-07-30: 200 ug via INTRAVENOUS
  Filled 2018-07-30: qty 0.4

## 2018-07-30 MED ORDER — HEPARIN SODIUM (PORCINE) 1000 UNIT/ML IJ SOLN
INTRAMUSCULAR | Status: AC
  Administered 2018-07-30: 1000 [IU] via INTRAVENOUS_CENTRAL
  Filled 2018-07-30: qty 8

## 2018-07-30 MED ORDER — SODIUM CHLORIDE 0.9% IV SOLUTION: Freq: Once | INTRAVENOUS | Status: DC

## 2018-07-30 MED ORDER — CALCITRIOL 0.25 MCG PO CAPS
ORAL_CAPSULE | ORAL | Status: AC
  Filled 2018-07-30: qty 1

## 2018-07-30 MED ORDER — CALCITRIOL 0.5 MCG PO CAPS
ORAL_CAPSULE | ORAL | Status: AC
  Administered 2018-07-30: 1.25 ug via ORAL
  Filled 2018-07-30: qty 2

## 2018-07-30 NOTE — Progress Notes (Signed)
PHYSICAL MEDICINE & REHABILITATION PROGRESS NOTE  Subjective/Complaints: Patient seen laying in bed this morning.  He states he fairly overnight.  He states he had a tiring first in therapies yesterday.  Discussed blood pressure issues with nursing.  He was seen by nephrology yesterday, notes reviewed-plan for HD today.  ROS: Denies CP, shortness of breath, nausea, vomiting, diarrhea.  Objective: Vital Signs: Blood pressure 94/64, pulse 91, temperature 98.5 F (36.9 C), temperature source Oral, resp. rate 17, height 5\' 10"  (1.778 m), weight 91.3 kg, SpO2 98 %. No results found. Recent Labs    07/28/18 0114  WBC 7.8  HGB 7.1*  HCT 22.0*  PLT 115*   Recent Labs    07/28/18 1338  NA 134*  K 5.2*  CL 93*  CO2 22  GLUCOSE 127*  BUN 75*  CREATININE 9.15*  CALCIUM 9.1    Physical Exam: BP 94/64 (BP Location: Left Arm)   Pulse 91   Temp 98.5 F (36.9 C) (Oral)   Resp 17   Ht 5\' 10"  (1.778 m)   Wt 91.3 kg   SpO2 98%   BMI 28.88 kg/m  Constitutional: No distress . Vital signs reviewed. HENT: Normocephalic.  Atraumatic. Eyes: EOMI.  No discharge. Cardiovascular: No JVD. Respiratory: Normal effort. GI: Non-distended. Musculoskeletal: Left lower extremity edema, improving Neurological:  Alert and oriented Follows commands.  Motor: Right lower extremity: 4/5 proximal distal, stable Left lower extremity: Hip flexion, knee extension 2/5, ankle dorsiflexion 4-/5, stable Sensation intact light touch Skin: Skin iswarm. He isnot diaphoretic. Femoropopliteal bypass site C/D/I  Left foot with ischemia on plantar toes  Psychiatric: He has a normal mood and affect. Hisbehavior is normal.Judgmentand thought contentnormal.   Assessment/Plan: 1. Functional deficits secondary to debility which require 3+ hours per day of interdisciplinary therapy in a comprehensive inpatient rehab setting.  Physiatrist is providing close team supervision and 24 hour  management of active medical problems listed below.  Physiatrist and rehab team continue to assess barriers to discharge/monitor patient progress toward functional and medical goals  Care Tool:  Bathing    Body parts bathed by patient: Right arm, Left arm, Chest, Abdomen, Front perineal area, Left upper leg, Right upper leg, Face   Body parts bathed by helper: Buttocks, Right lower leg Body parts n/a: Left lower leg   Bathing assist Assist Level: Moderate Assistance - Patient 50 - 74%     Upper Body Dressing/Undressing Upper body dressing   What is the patient wearing?: Pull over shirt    Upper body assist Assist Level: Minimal Assistance - Patient > 75%    Lower Body Dressing/Undressing Lower body dressing      What is the patient wearing?: Pants     Lower body assist Assist for lower body dressing: Maximal Assistance - Patient 25 - 49%     Toileting Toileting    Toileting assist Assist for toileting: Moderate Assistance - Patient 50 - 74%     Transfers Chair/bed transfer  Transfers assist     Chair/bed transfer assist level: Moderate Assistance - Patient 50 - 74%     Locomotion Ambulation   Ambulation assist   Ambulation activity did not occur: Safety/medical concerns          Walk 10 feet activity   Assist  Walk 10 feet activity did not occur: Safety/medical concerns        Walk 50 feet activity   Assist Walk 50 feet with 2 turns activity did not occur: Safety/medical  concerns         Walk 150 feet activity   Assist Walk 150 feet activity did not occur: Safety/medical concerns         Walk 10 feet on uneven surface  activity   Assist Walk 10 feet on uneven surfaces activity did not occur: Safety/medical concerns         Wheelchair     Assist Will patient use wheelchair at discharge?: Yes Type of Wheelchair: Manual    Wheelchair assist level: Supervision/Verbal cueing Max wheelchair distance: 150'     Wheelchair 50 feet with 2 turns activity    Assist        Assist Level: Supervision/Verbal cueing   Wheelchair 150 feet activity     Assist     Assist Level: Supervision/Verbal cueing      Medical Problem List and Plan: 1.Debilitysecondary to PAD status post left common femoral to below-knee popliteal artery bypass 07/20/2583 complicated by non-STEMI  Continue CIR  Team conference today to discuss current and goals and coordination of care, home and environmental barriers, and discharge planning with nursing, case manager, and therapies.  2. Antithrombotics: -DVT/anticoagulation:Subcutaneous heparin -antiplatelet therapy: Aspirin 81 mg daily, Plavix 75 mg daily 3. Pain Management:Neurontin 300 mg every evening, oxycodone as needed 4. Mood:Provide emotional support -antipsychotic agents: N/A 5. Neuropsych: This patientiscapable of making decisions on hisown behalf. 6. Skin/Wound Care:Routine skin checks. Local skin care, protection to necrotic areas along toes of left foot.   Will need to monitor closely for left lower extremity 7. Fluids/Electrolytes/Nutrition: Routine in and outs 8. Acute blood loss anemia. Continue Aranesp  Hemoglobin 7.1 on 5/18, labs with HD  Continue to monitor 9.Hypotension/aortic stenosis.   Blood pressure is low, plan for HD today, will need to monitor carefully given low blood pressures and increased activities 10. End-stage renal disease. Continue hemodialysis as directed.  Recs per nephro  11. Hyperlipidemia. Lipitor 12. Chronic combined diastolic systolic congestive heart failure. Monitor for any signs of fluid overload  Daily weights ordered [facility] Weights   07/29/18 0520  Weight: 91.3 kg   13.Constipation. Laxative assistance 14.  Thrombocytopenia  Platelets 115 on 5/18, labs with HD  Continue to monitor  LOS: 2 days A FACE TO FACE EVALUATION WAS PERFORMED  Ankit Lorie Phenix 07/30/2018, 9:14 AM

## 2018-07-30 NOTE — Care Management (Signed)
[facility] Individual Statement of Services  Patient Name:  Jose Garrett  Date:  07/30/2018  Welcome to the [facility].  Our goal is to provide you with an individualized program based on your diagnosis and situation, designed to meet your specific needs.  With this comprehensive rehabilitation program, you will be expected to participate in at least 3 hours of rehabilitation therapies Monday-Friday, with modified therapy programming on the weekends.  Your rehabilitation program will include the following services:  Physical Therapy (PT), Occupational Therapy (OT), 24 hour per day rehabilitation nursing, Case Management (Social Worker), Rehabilitation Medicine, Nutrition Services and Pharmacy Services  Weekly team conferences will be held on Wednesdays to discuss your progress.  Your Social Worker will talk with you frequently to get your input and to update you on team discussions.  Team conferences with you and your family in attendance may also be held.  Expected length of stay: 12-14 days   Overall anticipated outcome: modified independent  Depending on your progress and recovery, your program may change. Your Social Worker will coordinate services and will keep you informed of any changes. Your Social Worker's name and contact numbers are listed  below.  The following services may also be recommended but are not provided by the [facility] will be made to provide these services after discharge if needed.  Arrangements include referral to agencies that provide these services.  Your insurance has been verified to be:  Medicare Your primary doctor is:  [place]  Pertinent information will be shared with your doctor and your insurance company.  Social Worker:  [place], [place] or [phone]   Information  discussed with and copy given to patient by: Lennart Pall, 07/30/2018, 12:14 PM

## 2018-07-30 NOTE — Progress Notes (Addendum)
Jose Garrett Progress Note   Subjective:  Seen on HD - 1L UF planned, BP has been low side although asymptomatic. Plan for 1U PRBCs with HD - waiting on blood at this time.  Objective Vitals:   07/30/18 1330 07/30/18 1400 07/30/18 1430 07/30/18 1500  BP: (!) 86/55 (!) 85/40 (!) 97/58 (!) 91/54  Pulse: 86 89 88 85  Resp:      Temp:      TempSrc:      SpO2:      Weight:      Height:       Physical Exam General: Elderly man, NAD Heart: RRR; no murmur Lungs: CTA anteriorly Abdomen: soft, mild bleeding area in RLQ from prior "injection" Extremities: No LE edema Dialysis Access:  AVF + thrill  Additional Objective Labs: Basic Metabolic Panel: Recent Labs  Lab 07/25/18 0520 07/26/18 0233 07/28/18 1338 07/30/18 1341  NA 134* 133* 134* 134*  K 4.9 5.4* 5.2* 5.3*  CL 92* 94* 93* 95*  CO2 25 22 22 23   GLUCOSE 113* 113* 127* 121*  BUN 59* 84* 75* 64*  CREATININE 9.08* 10.83* 9.15* 8.62*  CALCIUM 9.2 8.7* 9.1 9.0  PHOS 8.6*  --  7.6* 7.1*   Liver Function Tests: Recent Labs  Lab 07/25/18 0520 07/28/18 1338 07/30/18 1341  ALBUMIN 3.2* 3.0* 2.8*   No results for input(s): LIPASE, AMYLASE in the last 168 hours. CBC: Recent Labs  Lab 07/23/18 1849  07/25/18 0520 07/26/18 0233 07/27/18 0430 07/28/18 0114 07/30/18 1342  WBC 9.6   < > 9.0 7.6 9.0 7.8 7.9  NEUTROABS 7.7  --   --   --   --   --   --   HGB 7.4*   < > 7.3* 7.2* 6.8* 7.1* 6.9*  HCT 21.5*   < > 21.9* 21.4* 21.1* 22.0* 21.3*  MCV 94.3   < > 97.3 96.0 100.5* 101.4* 103.9*  PLT 122*   < > 124* 117* 125* 115* 120*   < > = values in this interval not displayed.   Cardiac Enzymes: Recent Labs  Lab 07/23/18 1849 07/24/18 0816  TROPONINI 5.25* >65.00*   Medications: . sodium chloride    . sodium chloride    . ferric gluconate (FERRLECIT/NULECIT) IV     . sodium chloride   Intravenous Once  . aspirin EC  81 mg Oral Daily  . atorvastatin  80 mg Oral Q supper  . calcitRIOL  1.25 mcg  Oral Q M,W,F-HD  . cinacalcet  60 mg Oral Q M,W,F-HD  . clopidogrel  75 mg Oral Q supper  . darbepoetin (ARANESP) injection - DIALYSIS  200 mcg Intravenous Q Wed-HD  . docusate sodium  100 mg Oral Daily  . gabapentin  300 mg Oral q morning - 10a  . heparin      . heparin  5,000 Units Subcutaneous Q8H  . loratadine  10 mg Oral Daily  . pantoprazole  40 mg Oral Daily  . sucroferric oxyhydroxide  1,000 mg Oral TID WC  . sucroferric oxyhydroxide  500 mg Oral With snacks  . vitamin B-12  1,000 mcg Oral Daily   Dialysis Orders: [facility] MWF 4h  400/800    86kg  2/2   P4   R AVF  Hep ? Sensipar 60 TIW Calcitriol 1.25 TIW Mircera 50 q 2 weeks (last 4/29)  Assessment/Plan: 1. ESRD: Continue per MWF schedule - HD today.  2 Hyperkalemia: Should improve with HD.  3. PAD with  LLE critical limb ischemia: S/p L fem-pop BPG  4.  Ischemic cardiomyopathy / severe AS / EF 35-40%. Issues getting more UF d/t hypotension.  5.  Anemia (d/t ESRD and ABLA): On aranesp and nulecit -  inc both doses. Transfused 1 unit PRBC'son 5/13, 5/17, and ordered 1U more for today with HD.  6.  Secondary hyperparathyroidism: Continue home velphoro,  Sensipar, calcitriol    7. Elevated troponin: S/p cath with severe single vessel CAD chronic total chronic occlusion of RCA. Medical therapy recommended. No targets for PCI.   Jose Penton, PA-C 07/30/2018, 3:26 PM  [facility] Pager: [phone]  Pt seen, examined and agree w A/P as above.  Jose Splinter  MD 07/30/2018, 3:50 PM

## 2018-07-30 NOTE — Progress Notes (Signed)
Physical Therapy Session Note  Patient Details  Name: Jose Garrett MRN: [id number] Date of Birth: October 26, 1950  Today's Date: 07/30/2018 PT Individual Time: 0801-0846 PT Individual Time Calculation (min): 45 min   Short Term Goals: Week 1:  PT Short Term Goal 1 (Week 1): Pt will participate in 60 min of upright activity w/o increase in fatigue PT Short Term Goal 2 (Week 1): Pt will perform transfers w/ CGA PT Short Term Goal 3 (Week 1): Pt will ambulate 63' w/ supervision using LRAD  Skilled Therapeutic Interventions/Progress Updates:      Therapy Documentation Precautions:  Precautions Precautions: Fall Restrictions Weight Bearing Restrictions: No  Pain: Pain Assessment Pain Scale: 0-10 Pain Score: 0-No pain  Treatment: Pt received lying in bed. Agreeable to PT, does complain of being tired as he did not sleep well last night. "It's just not a good day today, I think I overdid it yesterday with all the therapy". Reports left LE pain as "okay" right now, does not want pain medication. BP 94/64 before session  Bed Mobility: bil TED hose donned by PTA in bed. Pt able to hold/move right LE, needed assist to move left LE around. Supine to sitting edge of bed with HOB ~20 degrees and rail used, min assist to move left LE off edge of bed and complete trunk transition into sitting at edge of bed. Once seated pt supervision level to scoot hips closer to edge of bed. Seated edge of bed with supervision: pt able to self removed soiled scrub top and don new one (still bleeding from site of Lovenox injection, band aide applied over site, RN aware). Max assist to don non skid socks as pt's left foot too swollen to fit into bed room shoes. Pt to call his family and see about getting them to bring him some sneakers.   Exercises: at edge of bed in unsupported sitting with UE support: bil LE's heel/toe raises, long arc quads and marching, 2 sets of 10 reps. Assist needed with left LE long arc quads  and marching. Cues to maintain upright posture. BP checked at edge of bed before standing with reading of 102/61.  Transfers: sit<>stand attempted x 3 reps from low bed height. Cues needed for hand placement, left LE placement to decrease pressure with standing and for anterior weight shifting with each rep. Unable to achieve standing despite max assist and cues due to pt trying to stand straight up with out shifting weight forward. Raised bed height up and used momentum with rocking to facilitation anterior weight shifting with standing achieved on 4th rep with mod assist. Once pt was up only min assist was needed to maintain standing balance with RW support with cues for trunk extension and left knee extension so to place left foot on floor. Once pt was able to get left foot on floor began gait training.   Gait: with RW pt needed cues on correct sequencing with gait and min assist for balance. Cues for posture, to shift weight onto left foot with UE's used to off load while advancing right foot. Pt with step to antalgic gait pattern. Chair brought behind him for safety. After ~12 feet pt needed to sit down due to UE's were "burning".   Clinical Impression: Pt left in recliner with feet up, belt alarm and all needs in reach. Limited gait this session due to left LE pain. LE does continue to present with edema and tightness.     Therapy/Group: Individual Therapy  Delories Heinz,  Juliann Pulse  07/30/18, 3:49 PM 07/30/2018, 3:49 PM

## 2018-07-30 NOTE — Progress Notes (Signed)
[facility] tired.  He states he sits up a lot between therapy   Objective 94/64 91 98.5 F (36.9 C) (Oral) 17 98%  Intake/Output Summary (Last 24 hours) at 07/30/2018 1045 Last data filed at 07/30/2018 0827 Gross per 24 hour  Intake 360 ml  Output -  Net 360 ml    Left LE edema with doppler signals PT/brisk, DP/peroneal intact Active range of motion of the ankle without increased pain.  Sensation decreased, but at baseline. Calf incision healing well with out drainage or erythema. Groin incision healing well without hematoma, drainage or erythema.   Assessment/Planning: S/P 1.  Left common femoral to below-knee popliteal artery bypass graft with 6 mm ringed PTFE 2.  Harvest of left below-knee greater saphenous vein By Dr. Donzetta Matters 07/22/2018  He has patent flow in his bypass.   He has had moderate edema in the left LE we ordered ted hose to assist with  Edema.  He should elevate his left LE with his bed in trendelenburg between therapy sessions if possible to help with edema.    He likely has fluid over load secondary to ESRD as well.    Roxy Horseman 07/30/2018 10:45 AM --  Laboratory Lab Results: Recent Labs    07/28/18 0114  WBC 7.8  HGB 7.1*  HCT 22.0*  PLT 115*   BMET Recent Labs    07/28/18 1338  NA 134*  K 5.2*  CL 93*  CO2 22  GLUCOSE 127*  BUN 75*  CREATININE 9.15*  CALCIUM 9.1    COAG Lab Results  Component Value Date   INR 1.2 07/22/2018   INR 1.27 01/23/2016   INR 1.10 (L) 01/21/2015   PROTIME 13.2 01/21/2015   Lab Results  Component Value Date   PTT 48 (H) 10/11/2014

## 2018-07-30 NOTE — Progress Notes (Signed)
Physical Therapy Session Note  Patient Details  Name: Jose Garrett MRN: [id number] Date of Birth: 19-Aug-1950  Today's Date: 07/30/2018 PT Individual Time: 1100-1110 PT Individual Time Calculation (min): 10 min  and Today's Date: 07/30/2018 PT Missed Time: 50 Minutes Missed Time Reason: Other (Comment)(low BP)  Short Term Goals: Week 1:  PT Short Term Goal 1 (Week 1): Pt will participate in 60 min of upright activity w/o increase in fatigue PT Short Term Goal 2 (Week 1): Pt will perform transfers w/ CGA PT Short Term Goal 3 (Week 1): Pt will ambulate 51' w/ supervision using LRAD  Skilled Therapeutic Interventions/Progress Updates:   Pt in slight trendelenburg position in supine for LLE edema management. Pt lethargic, but responding to therapist's questions appropriately. BP 88/53 in this position, deferred further OOB therapy 2/2 low BP. Placed pt in more of a true trendelenburg position for BP management. Pt resting comfortably. Missed 50 min of skilled PT.   Therapy Documentation Precautions:  Precautions Precautions: Fall Restrictions Weight Bearing Restrictions: No General: PT Amount of Missed Time (min): 50 Minutes PT Missed Treatment Reason: Other (Comment)(low BP) Vital Signs: Therapy Vitals BP: (!) 88/58  Therapy/Group: Individual Therapy  Alisandra Son Clent Demark 07/30/2018, 12:37 PM

## 2018-07-30 NOTE — Progress Notes (Signed)
Pts BP at 1900 88/63. BP was repeated x2, 83/58 at 2233, 82/55 at 2357. Pt is asymptomatic at this time. This nurse will continue to monitor, and obtain BP Q2h. Will report findings to the provider.

## 2018-07-30 NOTE — Progress Notes (Signed)
Occupational Therapy Session Note  Patient Details  Name: Jose Garrett MRN: [id number] Date of Birth: 08/04/1950  Today's Date: 07/30/2018 OT Individual Time: 1000-1043 OT Individual Time Calculation (min): 43 min    Short Term Goals: Week 1:  OT Short Term Goal 1 (Week 1): Pt will complete toilet transfer with min A and LRAD OT Short Term Goal 2 (Week 1): Pt will complete 1 step of LB dressing task OT Short Term Goal 3 (Week 1): Pt will tolerate standing for 2 minutes in preparation for BADL tasks  Skilled Therapeutic Interventions/Progress Updates:    1:1. Pt received in recliner with vascular PA present reporting wanting pt in supine/trendelenburg in between sessions d/t swelling. OT makes sign for above headboard for communication with staff. Pt completes stand pivot transfer to EOB with MOD A for lifting d/t pain and VC for hand palcemet on RW. Pt bathes UB seated at EOB with VC for oprning soap. Pt bathes LB with A for washing B feet. Pt completes peri and buttock washing leaning side to side. PT dons pants EOB over R foot and stands with MOD A as OT advances pants past hips. Exited session with pt supine in trendelenburg, call lightin reach and exit alarm on.    Therapy Documentation Precautions:  Precautions Precautions: Fall Restrictions Weight Bearing Restrictions: No General:   Vital Signs: Therapy Vitals Pulse Rate: 91 BP: 94/64 Pain:   ADL: ADL Eating: Set up Grooming: Supervision/safety, Setup Upper Body Bathing: Setup Lower Body Bathing: Maximal assistance Upper Body Dressing: Minimal assistance Lower Body Dressing: Maximal assistance Toileting: Moderate assistance Toilet Transfer: Moderate assistance Tub/Shower Transfer: Unable to assess Vision   Perception    Praxis   Exercises:   Other Treatments:     Therapy/Group: Individual Therapy  Tonny Branch 07/30/2018, 10:28 AM

## 2018-07-30 NOTE — Progress Notes (Signed)
This nurse reported  to [place], [place] about repeated hypotensive blood pressures and edema for pt. Jose Garrett stated he would follow up with vascular.

## 2018-07-30 NOTE — Progress Notes (Signed)
Social Work  Social Work Assessment and Plan  Patient Details  Name: Jose Garrett MRN: [id number] Date of Birth: 1950-07-04  Today's Date: 07/30/2018  Problem List:  Patient Active Problem List   Diagnosis Date Noted  . Acute on chronic anemia   . Thrombocytopenia ([place])   . Chronic combined systolic and diastolic congestive heart failure ([place])   . ESRD on dialysis ([place])   . Hemodialysis-associated hypotension   . Acute blood loss anemia   . Debility 07/28/2018  . PAD (peripheral artery disease) ([place]) 07/22/2018  . Shortness of breath 03/25/2018  . Chest pain   . Dyspnea 03/23/2018  . Acute on chronic combined systolic and diastolic CHF (congestive heart failure) ([place]) 11/30/2017  . Non-ST elevation (NSTEMI) myocardial infarction ([place])   . Elevated troponin 11/25/2017  . Respiratory failure ([place])   . Neuropathy 03/14/2017  . Fever   . CKD (chronic kidney disease) stage 5, GFR less than 15 ml/min (HCC)   . ESRD (end stage renal disease) on dialysis ([place])   . Acute renal failure superimposed on chronic kidney disease ([place])   . Hypervolemia   . Acute diastolic (congestive) heart failure ([place]) 01/23/2016  . Fluid overload, unspecified 01/23/2016  . Acute pulmonary edema ([place]) 01/23/2016  . Skin tear of right forearm without complication 60/63/0160  . Iron deficiency anemia 11/22/2015  . Nonrheumatic aortic valve stenosis 08/22/2015  . Chronic kidney disease with dialysis modality undecided, stage 5 ([place]): Progressive 08/01/2015  . B12 deficiency 07/19/2015  . Seasonal allergies 06/30/2015  . Back pain 03/18/2015  . Hip pain 02/18/2015  . AL amyloidosis ([place]) 09/25/2014  . Anemia of chronic disease 09/25/2014  . Abnormal bruising 09/25/2014  . Renal hematoma, right 09/08/2014  . Chronic diastolic heart failure, NYHA class 1 ([place]) 09/08/2014  . Hematuria   . Proteinuria   . Vitamin D deficiency 01/21/2014  . Hypertriglyceridemia 01/21/2014  . Essential hypertension,  benign 11/06/2013  . Dependent edema 11/06/2013  . DOE (dyspnea on exertion) 11/06/2013  . Other malaise and fatigue 11/06/2013   Past Medical History:  Past Medical History:  Diagnosis Date  . Amyloidosis ([place])   . Anemia Dx 2016  . Anxiety    "especially with procedure"- 07/21/2018  . CAD (coronary artery disease)    a. cardiac cath on 11/25/17 with occlusion of the RCA which could have been his event but the RCA could not be engaged. The RCA filled distally from left to right collaterals. There were no severe stenoses in the left coronary system. Medical therapy recommended, started on plavix.  . Chronic combined systolic and diastolic CHF (congestive heart failure) ([place])    a. EF dropped to 35-40% with grade 2 DD by echo 11/2017.  [name] Diarrhea   . ESRD (end stage renal disease) ([place])    ESRD- HEMO MWF- [facility]  . Family history of adverse reaction to anesthesia    "my daughter can't take certain anesthesia agents" (09/08/2014)  . GERD (gastroesophageal reflux disease)   . Heart murmur   . History of blood transfusion 09/08/2014   "got hematoma after renal biopsy & HgB dropped"  . Hyperlipidemia Dx 2012  . Hypertension Dx 2012  . Myocardial infarction ([place]) 11/2017  . Neuropathy   . Peripheral vascular disease ([place])   . Restless legs    Past Surgical History:  Past Surgical History:  Procedure Laterality Date  . ABDOMINAL AORTOGRAM W/LOWER EXTREMITY Bilateral 07/16/2018   Procedure: ABDOMINAL AORTOGRAM W/LOWER EXTREMITY;  Surgeon: Fletcher Anon,  Mertie Clause, MD;  Location: [facility];  Service: Cardiovascular;  Laterality: Bilateral;  . AV FISTULA PLACEMENT Right 12/27/2015   Procedure: Right Arm ARTERIOVENOUS (AV) FISTULA CREATION;  Surgeon: Angelia Mould, MD;  Location: [facility];  Service: Vascular;  Laterality: Right;  . COLONOSCOPY    . FEMORAL-POPLITEAL BYPASS GRAFT Left 07/22/2018   Procedure: left FEMORAL TO BELOW KNEE POPLITEAL ARTERY BYPASS GREAFT using 34mm  removable ring propaten gore graft;  Surgeon: Waynetta Sandy, MD;  Location: [facility];  Service: Vascular;  Laterality: Left;  . IR GENERIC HISTORICAL  01/27/2016   IR FLUORO GUIDE CV LINE RIGHT 01/27/2016 Arne Cleveland, MD [facility]  . IR GENERIC HISTORICAL  01/27/2016   IR US GUIDE VASC ACCESS RIGHT 01/27/2016 Arne Cleveland, MD [facility]  . LAPAROSCOPIC CHOLECYSTECTOMY  03/2011  . LEFT HEART CATH AND CORONARY ANGIOGRAPHY N/A 11/25/2017   Procedure: LEFT HEART CATH AND CORONARY ANGIOGRAPHY;  Surgeon: Martinique, Peter M, MD;  Location: [facility];  Service: Cardiovascular;  Laterality: N/A;  . LEFT HEART CATH AND CORONARY ANGIOGRAPHY N/A 07/24/2018   Procedure: LEFT HEART CATH AND CORONARY ANGIOGRAPHY;  Surgeon: Sherren Mocha, MD;  Location: [facility];  Service: Cardiovascular;  Laterality: N/A;  . RENAL BIOPSY, PERCUTANEOUS Right 09/08/2014  . RIGHT/LEFT HEART CATH AND CORONARY ANGIOGRAPHY N/A 03/25/2018   Procedure: RIGHT/LEFT HEART CATH AND CORONARY ANGIOGRAPHY;  Surgeon: Martinique, Peter M, MD;  Location: [facility];  Service: Cardiovascular;  Laterality: N/A;  . TONSILLECTOMY  ~ 1960  . ULTRASOUND GUIDANCE FOR VASCULAR ACCESS  03/25/2018   Procedure: Ultrasound Guidance For Vascular Access;  Surgeon: Martinique, Peter M, MD;  Location: [facility];  Service: Cardiovascular;;   Social History:  reports that he quit smoking about 7 years ago. His smoking use included cigarettes. He has a 80.00 pack-year smoking history. He has never used smokeless tobacco. He reports that he does not drink alcohol or use drugs.  Family / Support Systems Marital Status: Divorced Patient Roles: Parent(of 3 adult daughters) Spouse/Significant Other: ex-wife, Maryruth Hancock @ [phone]  Children: daughter, Alysia Penna (local) @ [phone] Anticipated Caregiver: daughter, [name] Ability/Limitations of Caregiver: daughter works at [employer], states his  exwife could probably step in during the day if he needed 24/7 supervision Caregiver Availability: Evenings only Family Dynamics: Pt notes that daugher, [name], is very supportive. He continues to have a good relationship with his ex-wife as well and believes she will also help if needed.  Social History Preferred language: English Religion: [place] Cultural Background: NA Read: Yes Write: Yes Employment Status: Retired Public relations account executive Issues: None Guardian/Conservator: None - per MD, pt is capable of making decisions on his own behalf.   Abuse/Neglect Abuse/Neglect Assessment Can Be Completed: Yes Physical Abuse: Denies Verbal Abuse: Denies Sexual Abuse: Denies Exploitation of patient/patient's resources: Denies Self-Neglect: Denies  Emotional Status Pt's affect, behavior and adjustment status: Pt lying in bed and resting between therapies.  Able to complete assessment interview without any difficulty.  Very humorous with some answers.  He denies any significant emotional distress - will monitor and refer for neuropsychology if indicated. Recent Psychosocial Issues: chronic medical issues Psychiatric History: none Substance Abuse History: none  Patient / Family Perceptions, Expectations & Goals Pt/Family understanding of illness & functional limitations: pt and daughter with good, general understanding of his medical course, current functional limitations/ need for CIR. Premorbid pt/family roles/activities: Pt independent and living alone  PTA Anticipated changes in roles/activities/participation: Daughter to assume primary caregiver role. Pt/family expectations/goals: "I just hope I can get back to my place soon."  [employer]: Other (Comment)(HD at [facility]. location) Premorbid Home Care/DME Agencies: None Transportation available at discharge: yes  Discharge Planning Living Arrangements: [place]: Children, Other  relatives(ex-wife) Type of Residence: Private residence Insurance Resources: [employer] Financial Resources: [place] Referred: No Living Expenses: Rent Money Management: Patient Does the patient have any problems obtaining your medications?: No Home Management: pt Patient/Family Preliminary Plans: Pt expects to d/c to daughter's home initially. Social Work Anticipated Follow Up Needs: HH/OP Expected length of stay: 12-14 dtrs  Clinical Impression Pleasant gentleman here following FPBG and debility. Good support from local daughter and ex-wife.  No significant emotional distress reported - will monitor.  To follow for support and d/c planning needs.  Donalee Gaumond 07/30/2018, 11:30 AM

## 2018-07-31 ENCOUNTER — Inpatient Hospital Stay (HOSPITAL_COMMUNITY): Payer: Medicare Other | Admitting: Occupational Therapy

## 2018-07-31 ENCOUNTER — Inpatient Hospital Stay (HOSPITAL_COMMUNITY): Payer: Medicare Other

## 2018-07-31 ENCOUNTER — Inpatient Hospital Stay (HOSPITAL_COMMUNITY): Payer: Medicare Other | Admitting: Physical Therapy

## 2018-07-31 DIAGNOSIS — K5903 Drug induced constipation: Secondary | ICD-10-CM

## 2018-07-31 DIAGNOSIS — K5900 Constipation, unspecified: Secondary | ICD-10-CM

## 2018-07-31 MED ORDER — CHLORHEXIDINE GLUCONATE CLOTH 2 % EX PADS
6.0000 | MEDICATED_PAD | Freq: Every day | CUTANEOUS | Status: DC
  Administered 2018-08-01 – 2018-08-02 (×2): 6 via TOPICAL

## 2018-07-31 MED ORDER — POLYETHYLENE GLYCOL 3350 17 G PO PACK
17.0000 g | PACK | Freq: Two times a day (BID) | ORAL | Status: DC
  Administered 2018-07-31 – 2018-08-07 (×11): 17 g via ORAL
  Filled 2018-07-31 (×22): qty 1

## 2018-07-31 MED ORDER — DOCUSATE SODIUM 100 MG PO CAPS
100.0000 mg | ORAL_CAPSULE | Freq: Three times a day (TID) | ORAL | Status: DC
  Administered 2018-07-31 – 2018-08-12 (×25): 100 mg via ORAL
  Filled 2018-07-31 (×31): qty 1

## 2018-07-31 NOTE — Progress Notes (Signed)
Physical Therapy Session Note  Patient Details  Name: Jose Garrett MRN: [id number] Date of Birth: 04-13-1950  Today's Date: 07/31/2018 PT Individual Time: 1300-1345 PT Individual Time Calculation (min): 45 min   Short Term Goals: Week 1:  PT Short Term Goal 1 (Week 1): Pt will participate in 60 min of upright activity w/o increase in fatigue PT Short Term Goal 2 (Week 1): Pt will perform transfers w/ CGA PT Short Term Goal 3 (Week 1): Pt will ambulate 23' w/ supervision using LRAD  Skilled Therapeutic Interventions/Progress Updates:   Pt in recliner and agreeable to therapy w/ encouragement, denies pain at rest. Pt reporting feeling increased fatigue this session and "doesn't really feel like doing any work". Stand pivot transfer to w/c w/ min assist, total assist w/c transport to/from therapy gym. Min assist stand pivot to/from NuStep. Performed NuStep 8 min @ level 1 w/ all extremities to primarily work on LLE strengthening and gentle ROM. Pt performed within tolerable range and at tolerable speed, pain in LLE 5/10 w/ activity. Took a brief rest break every 30 sec or so 2/2 pain/fatigue. Pt refused further activity 2/2 fatigue despite education on importance of continuing to participate, eventually agreeable to self-propel w/c back to room w/ BUEs to work on endurance and strengthening. Propelled w/ supervision. Stand pivot w/ min assist to EOB. Ended session in supine, all needs in reach. Missed 15 min of skilled PT 2/2 refusal.   Therapy Documentation Precautions:  Precautions Precautions: Fall Restrictions Weight Bearing Restrictions: No Pain: Pain Assessment Pain Scale: 0-10 Pain Score: 3  Pain Type: Chronic pain Pain Location: Back Pain Orientation: Lower Pain Descriptors / Indicators: Aching Pain Onset: On-going Pain Intervention(s): Repositioned  Therapy/Group: Individual Therapy  Ajayla Iglesias K Kenlea Woodell 07/31/2018, 1:48 PM

## 2018-07-31 NOTE — Progress Notes (Signed)
Pt c/o of constipation, and requested to use the [facility]. Pt was able to have small BM that was hard and difficult to pass. This nurse administered PRN Sorbitol, and encourage the pt to get back in the bed to give the medication time to work. The pt refused to return to the bed. Pt stated " just get out of the room and I will call you when I am ready for you to come back. I need my own privacy". This nurse educated the pt on the risk and benefits if the nurse and tech was to exit the room, and the purpose of [place]  staying at the bedside was to ensure pt safety. The pt continued to tell this nurse and tech to leave the room while he had privacy and tried to use the [facility]. This nurse will continue to monitor

## 2018-07-31 NOTE — Progress Notes (Signed)
Occupational Therapy Session Note  Patient Details  Name: Jose Garrett MRN: [id number] Date of Birth: 1950/10/08  Today's Date: 07/31/2018 OT Individual Time: 9038-3338 OT Individual Time Calculation (min): 56 min   Short Term Goals: Week 1:  OT Short Term Goal 1 (Week 1): Pt will complete toilet transfer with min A and LRAD OT Short Term Goal 2 (Week 1): Pt will complete 1 step of LB dressing task OT Short Term Goal 3 (Week 1): Pt will tolerate standing for 2 minutes in preparation for BADL tasks  Skilled Therapeutic Interventions/Progress Updates:    Pt greeted semi-reclined in bed getting meds from nursing and agreeable to participate in therapy with encouragement. Discussed getting OOB to recliner for meals with pt agreeable. Checked blood pressure throughout session with details below. Pt came to sitting EOB with min A. Sat EOB for 10 minutes and donned shirt. Pt declined any bathing/dressing today despite encouragement. Pt agreeable to try to ambulate. Pt completed 3 sit<>stands with BP taken to ensure safety for ambulation. Pt did have drop in BP, but was not symptomatic. Min A sit<>stand w/ RW. Pt ambulated in the hallway with RW and min A ~15 feet. Pt needed increased time and multiple rest breaks with all activity. Pt left seated in wc with alarm belt on and needs met.   Sitting: 128/82 Standing 109/63 Standing s/p ambulation: 107/60  Therapy Documentation Precautions:  Precautions Precautions: Fall Restrictions Weight Bearing Restrictions: No Pain: Pain Assessment Pain Scale: 0-10 Pain Score: 3  Pain Type: Chronic pain Pain Location: Back Pain Orientation: Lower Pain Descriptors / Indicators: Aching Pain Onset: On-going Pain Intervention(s): Repositioned   Therapy/Group: Individual Therapy  Valma Cava 07/31/2018, 12:19 PM

## 2018-07-31 NOTE — Progress Notes (Signed)
Occupational Therapy Session Note  Patient Details  Name: Jose Garrett MRN: [id number] Date of Birth: 1951/01/27  Today's Date: 07/31/2018 OT Individual Time: 0701-0709 OT Individual Time Calculation (min): 8 min    Short Term Goals: Week 1:  OT Short Term Goal 1 (Week 1): Pt will complete toilet transfer with min A and LRAD OT Short Term Goal 2 (Week 1): Pt will complete 1 step of LB dressing task OT Short Term Goal 3 (Week 1): Pt will tolerate standing for 2 minutes in preparation for BADL tasks  Skilled Therapeutic Interventions/Progress Updates:    1:1. Pt received in bed reporting dificulty voiding bowel over night and stating, "I dont feel like walking today." Continued education on improving endurance and participation in therapy would improve recovery, however pt contineus to state he was too tired, but was appreciative of information. Exited session with pt seated in bed, exit alarm on and call lightin reach  Therapy Documentation Precautions:  Precautions Precautions: Fall Restrictions Weight Bearing Restrictions: No General:   Vital Signs: Therapy Vitals Temp: 99.2 F (37.3 C) Temp Source: Oral Pulse Rate: 87 BP: 116/80 Patient Position (if appropriate): Lying Oxygen Therapy SpO2: 99 % O2 Device: Room Air Pain: Pain Assessment Pain Scale: 0-10 Pain Score: 2  Pain Type: Acute pain Pain Location: Leg ADL: ADL Eating: Set up Grooming: Supervision/safety, Setup Upper Body Bathing: Setup Lower Body Bathing: Maximal assistance Upper Body Dressing: Minimal assistance Lower Body Dressing: Maximal assistance Toileting: Moderate assistance Toilet Transfer: Moderate assistance Tub/Shower Transfer: Unable to assess Vision   Perception    Praxis   Exercises:   Other Treatments:     Therapy/Group: Individual Therapy  Tonny Branch 07/31/2018, 7:12 AM

## 2018-07-31 NOTE — Progress Notes (Addendum)
[facility] Progress Note   Subjective:  Seen in room - tired today. Says was up last night with constipation, finally able to go early this morning. No dyspnea. L leg remains edematous. Did get 1U PRBCs yesterday with dialysis.   Objective Vitals:   07/30/18 1738 07/30/18 1942 07/31/18 0500 07/31/18 0543  BP: 107/62 101/64  116/80  Pulse: 91 94  87  Resp: 18 18    Temp: 99.5 F (37.5 C) 99.6 F (37.6 C)  99.2 F (37.3 C)  TempSrc: Oral Oral  Oral  SpO2: 100% 96%  99%  Weight: 93.5 kg  91.2 kg   Height:       Physical Exam General: Elderly man, NAD Heart: RRR; 2/6 murmur Lungs: CTA anteriorly Abdomen: soft, non-tender Extremities: No RLE edema, 2+ LLE edema Dialysis Access:  AVF + thrill  Additional Objective Labs: Basic Metabolic Panel: Recent Labs  Lab 07/25/18 0520 07/26/18 0233 07/28/18 1338 07/30/18 1341  NA 134* 133* 134* 134*  K 4.9 5.4* 5.2* 5.3*  CL 92* 94* 93* 95*  CO2 25 22 22 23   GLUCOSE 113* 113* 127* 121*  BUN 59* 84* 75* 64*  CREATININE 9.08* 10.83* 9.15* 8.62*  CALCIUM 9.2 8.7* 9.1 9.0  PHOS 8.6*  --  7.6* 7.1*   Liver Function Tests: Recent Labs  Lab 07/25/18 0520 07/28/18 1338 07/30/18 1341  ALBUMIN 3.2* 3.0* 2.8*   CBC: Recent Labs  Lab 07/25/18 0520 07/26/18 0233 07/27/18 0430 07/28/18 0114 07/30/18 1342  WBC 9.0 7.6 9.0 7.8 7.9  HGB 7.3* 7.2* 6.8* 7.1* 6.9*  HCT 21.9* 21.4* 21.1* 22.0* 21.3*  MCV 97.3 96.0 100.5* 101.4* 103.9*  PLT 124* 117* 125* 115* 120*   Medications: . ferric gluconate (FERRLECIT/NULECIT) IV 125 mg (07/30/18 1714)   . sodium chloride   Intravenous Once  . aspirin EC  81 mg Oral Daily  . atorvastatin  80 mg Oral Q supper  . calcitRIOL  1.25 mcg Oral Q M,W,F-HD  . cinacalcet  60 mg Oral Q M,W,F-HD  . clopidogrel  75 mg Oral Q supper  . darbepoetin (ARANESP) injection - DIALYSIS  200 mcg Intravenous Q Wed-HD  . docusate sodium  100 mg Oral TID  . gabapentin  300 mg Oral q morning -  10a  . heparin  5,000 Units Subcutaneous Q8H  . loratadine  10 mg Oral Daily  . pantoprazole  40 mg Oral Daily  . polyethylene glycol  17 g Oral BID  . sucroferric oxyhydroxide  1,000 mg Oral TID WC  . sucroferric oxyhydroxide  500 mg Oral With snacks  . vitamin B-12  1,000 mcg Oral Daily    Dialysis Orders: [facility] MWF 4h 400/800 86kg 2/2 P4R AVF Hep ? Sensipar 60 TIW Calcitriol 1.25 TIW Mircera 50 q 2 weeks (last 4/29)  Assessment/Plan: 1. ESRD: Continue per MWF schedule, next 5/22.  2.  Hyperkalemia: Should improve with HD.  3. PAD with LLE critical limb ischemia: S/pL fem-pop BPG. Ongoing LLE edema.  4. Ischemic cardiomyopathy / severe AS / EF 35-40%. Issues getting more UF d/t hypotension.  5. Anemia (d/t ESRD and ABLA): On aranesp and nulecit - inc both doses. Transfused 1 unit PRBC'son 5/13, 5/17, 5/20.  6. Secondary hyperparathyroidism: Continue home velphoro, Sensipar, calcitriol   7. Elevated troponin: S/p cath with severe single vessel CAD chronic total chronic occlusion of RCA. Medical therapy recommended. No targets for PCI.  Veneta Penton, PA-C 07/31/2018, 12:53 PM  [facility] Pager: [phone])  [phone]  Pt seen, examined and agree w A/P as above.  [name]  MD 07/31/2018, 4:31 PM

## 2018-07-31 NOTE — Patient Care Conference (Signed)
Inpatient RehabilitationTeam Conference and Plan of Care Update Date: 07/30/2018   Time: 2:30 PM    Patient Name: Jose Garrett      Medical Record Number: [id number]  Date of Birth: [birth year] Sex: Male         Room/Bed: 4M08C/4M08C-01 Payor Info: Payor: MEDICARE / Plan: MEDICARE PART A AND B / Product Type: *No Product type* /    Admitting Diagnosis: ABI Team  Debility, malaise; 13-15days  Admit Date/Time:  07/28/2018  6:35 PM Admission Comments: No comment available   Primary Diagnosis:  <principal problem not specified> Principal Problem: <principal problem not specified>  Patient Active Problem List   Diagnosis Date Noted  . Drug induced constipation   . Obstipation   . Acute on chronic anemia   . Thrombocytopenia ([place])   . Chronic combined systolic and diastolic congestive heart failure ([place])   . ESRD on dialysis ([place])   . Hemodialysis-associated hypotension   . Acute blood loss anemia   . Debility 07/28/2018  . PAD (peripheral artery disease) ([place]) 07/22/2018  . Shortness of breath 03/25/2018  . Chest pain   . Dyspnea 03/23/2018  . Acute on chronic combined systolic and diastolic CHF (congestive heart failure) ([place]) 11/30/2017  . Non-ST elevation (NSTEMI) myocardial infarction ([place])   . Elevated troponin 11/25/2017  . Respiratory failure ([place])   . Neuropathy 03/14/2017  . Fever   . CKD (chronic kidney disease) stage 5, GFR less than 15 ml/min (HCC)   . ESRD (end stage renal disease) on dialysis ([place])   . Acute renal failure superimposed on chronic kidney disease ([place])   . Hypervolemia   . Acute diastolic (congestive) heart failure ([place]) 01/23/2016  . Fluid overload, unspecified 01/23/2016  . Acute pulmonary edema ([place]) 01/23/2016  . Skin tear of right forearm without complication 97/35/3299  . Iron deficiency anemia 11/22/2015  . Nonrheumatic aortic valve stenosis 08/22/2015  . Chronic kidney disease with dialysis modality undecided, stage 5 ([place]): Progressive  08/01/2015  . B12 deficiency 07/19/2015  . Seasonal allergies 06/30/2015  . Back pain 03/18/2015  . Hip pain 02/18/2015  . AL amyloidosis ([place]) 09/25/2014  . Anemia of chronic disease 09/25/2014  . Abnormal bruising 09/25/2014  . Renal hematoma, right 09/08/2014  . Chronic diastolic heart failure, NYHA class 1 ([place]) 09/08/2014  . Hematuria   . Proteinuria   . Vitamin D deficiency 01/21/2014  . Hypertriglyceridemia 01/21/2014  . Essential hypertension, benign 11/06/2013  . Dependent edema 11/06/2013  . DOE (dyspnea on exertion) 11/06/2013  . Other malaise and fatigue 11/06/2013    Expected Discharge Date: Expected Discharge Date: 08/09/18  Team Members Present: Physician leading conference: Dr. Delice Lesch Social Worker Present: Lennart Pall, LCSW Nurse Present: Rayetta Humphrey, RN PT Present: Burnard Bunting, PT OT Present: Mariane Masters, OT SLP Present: Weston Anna, SLP PPS Coordinator present : Ileana Ladd, PT     Current Status/Progress Goal Weekly Team [place] secondary to PAD status post left common femoral to below-knee popliteal artery bypass 2/42/6834 complicated by non-STEMI  Improve mobility, BP, platelets, LLE edema  See above   Bowel/Bladder   Pt is continent of B/B, Pt has anuria- LBM 07/28/18  Remain continent of B/B with regualr bowel pattern  Offer timed toileting 2QH, laxatives PRN   Swallow/Nutrition/ Hydration             ADL's   Mod/Max A sit<>stand, Min/mod A to pivot once standing, Max A LB ADLs  Supervision/Mod I  Sit<>stand, activity tolerance, general strengthening, self-care retraining   Mobility   mod assist sit<>stand, min-mod assist transfers and gait once in standing w/ RW, gait up to 25', limited by pain and low BP  mod I @ w/c level, supervision gait  OOB tolerance, endurance, all functional mobility   Communication             Safety/Cognition/ Behavioral Observations            Pain   Pt states LLE pain is 5/10,  PRN Oxycodone 5mg  when needed. Pt refusing pain medication at night, stating he only wants it during the day.   Pt will be free of pain.   Assess pain Qshift/PRN   Skin   Surgical incision on LLE, R/L Femoral incision, small amount of bruising.   Pt will remain free from infection and skin remain intact.  Assess surgical incisions and skin Qshift/ PRN    Rehab Goals Patient on target to meet rehab goals: Yes *See Care Plan and progress notes for long and short-term goals.     Barriers to Discharge  Current Status/Progress Possible Resolutions Date Resolved   Physician    Medical stability     See above  Therapies, recs per Nephro for BP, follow labs, Vasc recs      Nursing                  PT  Wound Care;Medical stability  Pt w/ supportive family, daughter able to arrange for nearly 24/7 assist if needed              OT                  SLP                SW                Discharge Planning/Teaching Needs:  Pt to d/c to daughter's home with intermittent assistance available.  Teaching needs TBD.   Team Discussion:  BP issues continue - MD notes these are chronic.  Decreased platelets - monitor.  Poor appetite.  Transfused in HD.  Min - max assist overall in therapies and supervision to mod ind goals.    Revisions to Treatment Plan:  NA    Continued Need for Acute Rehabilitation Level of Care: The patient requires daily medical management by a physician with specialized training in physical medicine and rehabilitation for the following conditions: Daily direction of a multidisciplinary physical rehabilitation program to ensure safe treatment while eliciting the highest outcome that is of practical value to the patient.: Yes Daily medical management of patient stability for increased activity during participation in an intensive rehabilitation regime.: Yes Daily analysis of laboratory values and/or radiology reports with any subsequent need for medication adjustment of medical  intervention for : Post surgical problems;Wound care problems   I attest that I was present, lead the team conference, and concur with the assessment and plan of the team.   Lennart Pall 07/31/2018, 3:35 PM   Team conference was held via web/ teleconference due to [place] - 19

## 2018-07-31 NOTE — Progress Notes (Signed)
Social Work Patient ID: TALIB HEADLEY, male   DOB: 1950-12-07, 68 y.o.   MRN: [id number]   Met with pt today to review team conference.  Pt aware and agreeable with targeted d/c date of 5/30 and goals of supervision to modified independent overall.  He appeared somewhat clearer with communication today.  He asked that I contact his ex-wife, [name], and notes she is frustrated that "no doctor has called her for several days."   I did contact both ex-wife, [name] and pt's eldest daughter, Thompson Grayer (per the request of [name]) to address their concerns and review team conference info.  Confirmed that they are asking that the physicians following pt please contact daughter, [name], and provide update and address concerns they have as they feel he is "not getting better it seems... thought he was going to come in the hospital for 3 days for one procedure...".  They also note that his cognition is a concern and state, "this is absolutely not his baseline" and describe him as very independent and fully capable of managing his affairs and communicating very clearly.  No memory deficits PTA. Have requested that following MDs please follow up and I will check with daughter again tomorrow to see if this has happened.  Karalynn Cottone, LCSW

## 2018-07-31 NOTE — Progress Notes (Signed)
This nurse spoke with on call provider Marlowe Shores, PA regarding the 2200 Heparin (5,000 units)  medication, verbal order was given to not administer the 2200 Heparin (5,000 units).

## 2018-07-31 NOTE — Progress Notes (Signed)
[facility] PHYSICAL MEDICINE & REHABILITATION PROGRESS NOTE  Subjective/Complaints: Patient seen laying in bed this morning.  He states he did not sleep well overnight because he had a hard time having a bowel movement.  He states he is daily VN because of having a bowel movement.  Stressed importance of adequate nutrition and diet.  He was evaluated by vascular yesterday recommended daily Dopplers.  Discussed with PA..  ROS: Denies CP, shortness of breath, nausea, vomiting, diarrhea.  Objective: Vital Signs: Blood pressure 116/80, pulse 87, temperature 99.2 F (37.3 C), temperature source Oral, resp. rate 18, height 5\' 10"  (1.778 m), weight 91.2 kg, SpO2 99 %. No results found. Recent Labs    07/30/18 1342  WBC 7.9  HGB 6.9*  HCT 21.3*  PLT 120*   Recent Labs    07/28/18 1338 07/30/18 1341  NA 134* 134*  K 5.2* 5.3*  CL 93* 95*  CO2 22 23  GLUCOSE 127* 121*  BUN 75* 64*  CREATININE 9.15* 8.62*  CALCIUM 9.1 9.0    Physical Exam: BP 116/80 (BP Location: Left Arm)   Pulse 87   Temp 99.2 F (37.3 C) (Oral)   Resp 18   Ht 5\' 10"  (1.778 m)   Wt 91.2 kg   SpO2 99%   BMI 28.85 kg/m  Constitutional: No distress . Vital signs reviewed. HENT: Normocephalic.  Atraumatic. Eyes: EOMI.  No discharge. Cardiovascular: No JVD. Respiratory: Normal effort. GI: Non-distended. Musculoskeletal: Left lower extremity edema, stable Neurological:  Alert and oriented Follows commands.  Motor: Right lower extremity: 4/5 proximal distal, unchanged Left lower extremity: Hip flexion, knee extension 2/5, ankle dorsiflexion 4-/5, unchanged Sensation intact light touch Skin: Skin iswarm. He isnot diaphoretic. Femoropopliteal bypass site C/D/I  Left foot with ischemia on plantar toes  Psychiatric: He has a normal mood and affect. Hisbehavior is normal.Judgmentand thought contentnormal.   Assessment/Plan: 1. Functional deficits secondary to debility which require 3+ hours per  day of interdisciplinary therapy in a comprehensive inpatient rehab setting.  Physiatrist is providing close team supervision and 24 hour management of active medical problems listed below.  Physiatrist and rehab team continue to assess barriers to discharge/monitor patient progress toward functional and medical goals  Care Tool:  Bathing    Body parts bathed by patient: Right arm, Left arm, Chest, Abdomen, Front perineal area, Left upper leg, Right upper leg, Face   Body parts bathed by helper: Buttocks, Right lower leg Body parts n/a: Left lower leg   Bathing assist Assist Level: Moderate Assistance - Patient 50 - 74%     Upper Body Dressing/Undressing Upper body dressing   What is the patient wearing?: Pull over shirt    Upper body assist Assist Level: Independent    Lower Body Dressing/Undressing Lower body dressing      What is the patient wearing?: Pants     Lower body assist Assist for lower body dressing: Maximal Assistance - Patient 25 - 49%     Toileting Toileting    Toileting assist Assist for toileting: Maximal Assistance - Patient 25 - 49%     Transfers Chair/bed transfer  Transfers assist     Chair/bed transfer assist level: Moderate Assistance - Patient 50 - 74%     Locomotion Ambulation   Ambulation assist   Ambulation activity did not occur: Safety/medical concerns  Assist level: Minimal Assistance - Patient > 75% Assistive device: Walker-rolling Max distance: 12 feet   Walk 10 feet activity   Assist  Walk 10  feet activity did not occur: Safety/medical concerns  Assist level: Minimal Assistance - Patient > 75% Assistive device: Walker-rolling   Walk 50 feet activity   Assist Walk 50 feet with 2 turns activity did not occur: Safety/medical concerns         Walk 150 feet activity   Assist Walk 150 feet activity did not occur: Safety/medical concerns         Walk 10 feet on uneven surface  activity   Assist Walk  10 feet on uneven surfaces activity did not occur: Safety/medical concerns         Wheelchair     Assist Will patient use wheelchair at discharge?: Yes Type of Wheelchair: Manual    Wheelchair assist level: Supervision/Verbal cueing Max wheelchair distance: 150'    Wheelchair 50 feet with 2 turns activity    Assist        Assist Level: Supervision/Verbal cueing   Wheelchair 150 feet activity     Assist     Assist Level: Supervision/Verbal cueing      Medical Problem List and Plan: 1.Debilitysecondary to PAD status post left common femoral to below-knee popliteal artery bypass 09/10/7791 complicated by non-STEMI  Continue CIR 2. Antithrombotics: -DVT/anticoagulation:Subcutaneous heparin -antiplatelet therapy: Aspirin 81 mg daily, Plavix 75 mg daily 3. Pain Management:Neurontin 300 mg every evening, oxycodone as needed 4. Mood:Provide emotional support -antipsychotic agents: N/A 5. Neuropsych: This patientiscapable of making decisions on hisown behalf. 6. Skin/Wound Care:Routine skin checks. Local skin care, protection to necrotic areas along toes of left foot.   Will need to monitor closely for left lower extremity   Daily Dopplers and elevation cardiovascular 7. Fluids/Electrolytes/Nutrition: Routine in and outs 8. Acute blood loss anemia. Continue Aranesp  Hemoglobin 6.9 on 5/20, transfused 1 unit in HD  Continue to monitor 9.Hypotension/aortic stenosis.   Blood pressure is low, but improving 10. End-stage renal disease. Continue hemodialysis as directed.  Recs per nephro  11. Hyperlipidemia. Lipitor 12. Chronic combined diastolic systolic congestive heart failure. Monitor for any signs of fluid overload  Daily weights ordered Filed Weights   07/30/18 1714 07/30/18 1738 07/31/18 0500  Weight: 89 kg 93.5 kg 91.2 kg   Relatively stable on 5/21 13.Constipation. Laxative assistance 14.   Thrombocytopenia  Platelets 120 on 5/20  Continue to monitor 15.  Severe constipation  Bowel meds increased on 5/21  LOS: 3 days A FACE TO FACE EVALUATION WAS PERFORMED  Jose Garrett Lorie Phenix 07/31/2018, 11:56 AM

## 2018-08-01 ENCOUNTER — Inpatient Hospital Stay (HOSPITAL_COMMUNITY): Payer: Medicare Other | Admitting: Physical Therapy

## 2018-08-01 ENCOUNTER — Inpatient Hospital Stay (HOSPITAL_COMMUNITY): Payer: Medicare Other | Admitting: Occupational Therapy

## 2018-08-01 DIAGNOSIS — I2510 Atherosclerotic heart disease of native coronary artery without angina pectoris: Secondary | ICD-10-CM

## 2018-08-01 DIAGNOSIS — K5903 Drug induced constipation: Secondary | ICD-10-CM

## 2018-08-01 LAB — RENAL FUNCTION PANEL
Albumin: 2.8 g/dL — ABNORMAL LOW (ref 3.5–5.0)
Anion gap: 17 — ABNORMAL HIGH (ref 5–15)
BUN: 55 mg/dL — ABNORMAL HIGH (ref 8–23)
CO2: 24 mmol/L (ref 22–32)
Calcium: 9.1 mg/dL (ref 8.9–10.3)
Chloride: 93 mmol/L — ABNORMAL LOW (ref 98–111)
Creatinine, Ser: 8.33 mg/dL — ABNORMAL HIGH (ref 0.61–1.24)
GFR calc Af Amer: 7 mL/min — ABNORMAL LOW (ref >60)
GFR calc non Af Amer: 6 mL/min — ABNORMAL LOW (ref >60)
Glucose, Bld: 100 mg/dL — ABNORMAL HIGH (ref 70–99)
Phosphorus: 5.6 mg/dL — ABNORMAL HIGH (ref 2.5–4.6)
Potassium: 4 mmol/L (ref 3.5–5.1)
Sodium: 134 mmol/L — ABNORMAL LOW (ref 135–145)

## 2018-08-01 LAB — CBC
HCT: 19.7 % — ABNORMAL LOW (ref 39.0–52.0)
Hemoglobin: 6.3 g/dL — CL (ref 13.0–17.0)
MCH: 32.8 pg (ref 26.0–34.0)
MCHC: 32 g/dL (ref 30.0–36.0)
MCV: 102.6 fL — ABNORMAL HIGH (ref 80.0–100.0)
Platelets: 116 10*3/uL — ABNORMAL LOW (ref 150–400)
RBC: 1.92 MIL/uL — ABNORMAL LOW (ref 4.22–5.81)
RDW: 17.3 % — ABNORMAL HIGH (ref 11.5–15.5)
WBC: 5.6 10*3/uL (ref 4.0–10.5)
nRBC: 0.4 % — ABNORMAL HIGH (ref 0.0–0.2)

## 2018-08-01 LAB — PREPARE RBC (CROSSMATCH)

## 2018-08-01 MED ORDER — SODIUM CHLORIDE 0.9% IV SOLUTION: Freq: Once | INTRAVENOUS | Status: DC

## 2018-08-01 MED ORDER — CALCITRIOL 0.25 MCG PO CAPS
ORAL_CAPSULE | ORAL | Status: AC
  Filled 2018-08-01: qty 5

## 2018-08-01 NOTE — Progress Notes (Signed)
Occupational Therapy Session Note  Patient Details  Name: Jose Garrett MRN: [id number] Date of Birth: Dec 11, 1950  Today's Date: 08/01/2018 OT Individual Time: 6153-7943 OT Individual Time Calculation (min): 55 min    Short Term Goals: Week 1:  OT Short Term Goal 1 (Week 1): Pt will complete toilet transfer with min A and LRAD OT Short Term Goal 2 (Week 1): Pt will complete 1 step of LB dressing task OT Short Term Goal 3 (Week 1): Pt will tolerate standing for 2 minutes in preparation for BADL tasks  Skilled Therapeutic Interventions/Progress Updates:    Pt greeted semi-reclined in bed and agreeable to OT treatment session. OT offered pt to shower, but he stated he was too tired. Encouraged pt to complete bathing/dressing tasks, but he declined. Pt agreeable to walk. Pt came to sitting EOB with CGA. Sit<>stand with min A and RW. Pt ambulated 50 feet with RW and min A . 2 standing rest breaks. Focus on increased step length while ambulating. Pt needed more than reasonable amount of time to complete ambulation. Pt sat EOB and rested once returning to room and declined any further activity. Pt has reported some cognitive decline, so OT administered Montreal Cognitive Assessment ([place]). Pt score 21/30 with deficits in the areas of executive function, memory, language, and attention.  A score of 21/30 on the Ada indicates a mild cognitive decline and impairment. Reviewed score with patient and discussed incorporating cognitive tasks within OT session. Pt left semi-reclined in bed with needs met.   Therapy Documentation Precautions:  Precautions Precautions: Fall Restrictions Weight Bearing Restrictions: No Pain: Pain Assessment Pain Scale: 0-10 Pain Score: 7  Pain Type: Chronic pain Pain Location: Back Pain Orientation: Lower Pain Descriptors / Indicators: Aching Pain Frequency: Constant Pain Onset: On-going Pain Intervention(s): Emotional  support;Distraction;Relaxation;Rest   Therapy/Group: Individual Therapy  Valma Cava 08/01/2018, 10:27 AM

## 2018-08-01 NOTE — Plan of Care (Signed)
  Problem: Consults Goal: RH GENERAL PATIENT EDUCATION Description See Patient Education module for education specifics. Outcome: Progressing   Problem: RH BOWEL ELIMINATION Goal: RH STG MANAGE BOWEL WITH ASSISTANCE Description STG Manage Bowel with min.Assistance.  Outcome: Progressing   Problem: RH BLADDER ELIMINATION Goal: RH STG MANAGE BLADDER WITH ASSISTANCE Description STG Manage Bladder With min.Assistance  Outcome: Progressing   Problem: RH SKIN INTEGRITY Goal: RH STG SKIN FREE OF INFECTION/BREAKDOWN Description With mod. assist  Outcome: Progressing Goal: RH STG MAINTAIN SKIN INTEGRITY WITH ASSISTANCE Description STG Maintain Skin Integrity With mod.Assistance.  Outcome: Progressing Goal: RH STG ABLE TO PERFORM INCISION/WOUND CARE W/ASSISTANCE Description STG Able To Perform Incision/Wound Care With Mod.Assistance.  Outcome: Progressing   Problem: RH SAFETY Goal: RH STG ADHERE TO SAFETY PRECAUTIONS W/ASSISTANCE/DEVICE Description STG Adhere to Safety Precautions With Mod.Assistance/Device.  Outcome: Progressing Goal: RH STG DECREASED RISK OF FALL WITH ASSISTANCE Description STG Decreased Risk of Fall With Mod. Assistance.  Outcome: Progressing   Problem: RH PAIN MANAGEMENT Goal: RH STG PAIN MANAGED AT OR BELOW PT'S PAIN GOAL Description Less than 3  Outcome: Progressing   Problem: RH KNOWLEDGE DEFICIT GENERAL Goal: RH STG INCREASE KNOWLEDGE OF SELF CARE AFTER HOSPITALIZATION Description Pt. Will be able to verbalized how to manage his medications and post op. Care at home   Outcome: Progressing

## 2018-08-01 NOTE — Progress Notes (Signed)
[facility] PHYSICAL MEDICINE & REHABILITATION PROGRESS NOTE  Subjective/Complaints: Patient seen sitting up in bed this morning.  He states he slept well overnight.  He states he feels better.  Attempted to contact him yesterday, no answer, left voicemail. Attempted to contact again - significant time discussing with wife.   ROS: Denies CP, shortness of breath, nausea, vomiting, diarrhea.  Objective: Vital Signs: Blood pressure 96/61, pulse 83, temperature 98.5 F (36.9 C), temperature source Oral, resp. rate 18, height 5\' 10"  (1.778 m), weight 89 kg, SpO2 93 %. No results found. Recent Labs    07/30/18 1342  WBC 7.9  HGB 6.9*  HCT 21.3*  PLT 120*   Recent Labs    07/30/18 1341  NA 134*  K 5.3*  CL 95*  CO2 23  GLUCOSE 121*  BUN 64*  CREATININE 8.62*  CALCIUM 9.0    Physical Exam: BP 96/61 (BP Location: Left Arm)   Pulse 83   Temp 98.5 F (36.9 C) (Oral)   Resp 18   Ht 5\' 10"  (1.778 m)   Wt 89 kg   SpO2 93%   BMI 28.15 kg/m  Constitutional: No distress . Vital signs reviewed. HENT: Normocephalic.  Atraumatic. Eyes: EOMI.  No discharge. Cardiovascular: No JVD. Respiratory: Normal effort. GI: Non-distended. Musculoskeletal: Left lower extremity edema, unchanged Neurological:  Alert and oriented Follows commands.  Motor: Right lower extremity: 4/5 proximal distal, stable Left lower extremity: Hip flexion, knee extension 2-2+/5, ankle dorsiflexion 4-/5 Skin: Skin iswarm. He isnot diaphoretic. Femoropopliteal bypass site C/D/I  Left foot with ischemia on plantar toes  Psychiatric: He has a normal mood and affect. Hisbehavior is normal.Judgmentand thought contentnormal.   Assessment/Plan: 1. Functional deficits secondary to debility which require 3+ hours per day of interdisciplinary therapy in a comprehensive inpatient rehab setting.  Physiatrist is providing close team supervision and 24 hour management of active medical problems listed  below.  Physiatrist and rehab team continue to assess barriers to discharge/monitor patient progress toward functional and medical goals  Care Tool:  Bathing    Body parts bathed by patient: Right arm, Left arm, Chest, Abdomen, Front perineal area, Left upper leg, Right upper leg, Face   Body parts bathed by helper: Buttocks, Right lower leg Body parts n/a: Left lower leg   Bathing assist Assist Level: Moderate Assistance - Patient 50 - 74%     Upper Body Dressing/Undressing Upper body dressing   What is the patient wearing?: Pull over shirt    Upper body assist Assist Level: Independent    Lower Body Dressing/Undressing Lower body dressing      What is the patient wearing?: Pants     Lower body assist Assist for lower body dressing: Maximal Assistance - Patient 25 - 49%     Toileting Toileting    Toileting assist Assist for toileting: Moderate Assistance - Patient 50 - 74%     Transfers Chair/bed transfer  Transfers assist     Chair/bed transfer assist level: Minimal Assistance - Patient > 75%     Locomotion Ambulation   Ambulation assist   Ambulation activity did not occur: Safety/medical concerns  Assist level: Minimal Assistance - Patient > 75% Assistive device: Walker-rolling Max distance: 12 feet   Walk 10 feet activity   Assist  Walk 10 feet activity did not occur: Safety/medical concerns  Assist level: Minimal Assistance - Patient > 75% Assistive device: Walker-rolling   Walk 50 feet activity   Assist Walk 50 feet with 2 turns activity  did not occur: Safety/medical concerns         Walk 150 feet activity   Assist Walk 150 feet activity did not occur: Safety/medical concerns         Walk 10 feet on uneven surface  activity   Assist Walk 10 feet on uneven surfaces activity did not occur: Safety/medical concerns         Wheelchair     Assist Will patient use wheelchair at discharge?: Yes Type of Wheelchair:  Manual    Wheelchair assist level: Supervision/Verbal cueing Max wheelchair distance: 150'    Wheelchair 50 feet with 2 turns activity    Assist        Assist Level: Supervision/Verbal cueing   Wheelchair 150 feet activity     Assist     Assist Level: Supervision/Verbal cueing      Medical Problem List and Plan: 1.Debilitysecondary to PAD status post left common femoral to below-knee popliteal artery bypass 08/16/3014 complicated by non-STEMI  Continue CIR 2. Antithrombotics: -DVT/anticoagulation:Subcutaneous heparin -antiplatelet therapy: Aspirin 81 mg daily, Plavix 75 mg daily 3. Pain Management:Neurontin 300 mg every evening, oxycodone as needed 4. Mood:Provide emotional support -antipsychotic agents: N/A 5. Neuropsych: This patientiscapable of making decisions on hisown behalf. 6. Skin/Wound Care:Routine skin checks. Local skin care, protection to necrotic areas along toes of left foot.   Will need to monitor closely for left lower extremity   Daily Dopplers and elevation per cardiovascular 7. Fluids/Electrolytes/Nutrition: Routine in and outs 8. Acute blood loss anemia. Continue Aranesp  Hemoglobin 6.9 on 5/20, transfused 1 unit in HD  Continue to monitor 9.Hypotension/aortic stenosis.   Blood pressure remains low on 5/22 10. End-stage renal disease. Continue hemodialysis as directed.  Recs per nephro  11. Hyperlipidemia. Lipitor 12. Chronic combined diastolic systolic congestive heart failure with EF of 35-40% and severe left ear. Monitor for any signs of fluid overload  Daily weights ordered Filed Weights   07/30/18 1738 07/31/18 0500 08/01/18 0500  Weight: 93.5 kg 91.2 kg 89 kg   Improving on 5/22 13.Constipation. Laxative assistance 14.  Thrombocytopenia  Platelets 120 on 5/20  Continue to monitor 15.  Severe constipation  Bowel meds increased on 5/21  Will consider further increase in medications  necessary 16.  Severe CAD  Single-vessel disease total occlusion of RCA  Medical management   LOS: 4 days A FACE TO FACE EVALUATION WAS PERFORMED  Ankit Lorie Phenix 08/01/2018, 9:31 AM

## 2018-08-01 NOTE — Progress Notes (Signed)
Physical Therapy Session Note  Patient Details  Name: Jose Garrett MRN: [id number] Date of Birth: 06-21-1950  Today's Date: 08/01/2018 PT Individual Time: 11:16-11:57 PT Individual Time Calculation (min): 41 min   Short Term Goals: Week 1:  PT Short Term Goal 1 (Week 1): Pt will participate in 60 min of upright activity w/o increase in fatigue PT Short Term Goal 2 (Week 1): Pt will perform transfers w/ CGA PT Short Term Goal 3 (Week 1): Pt will ambulate 11' w/ supervision using LRAD  Treatment: Pt in bed on arrival to room. Agreed to PT today. BP 92/59.  Supine to sitting edge of bed with supervision, no rail and HOB 20. Seated edge of bed with supervision pt performed heel/toe raises, and long arc quads x 10 reps each bil LE's. BP 90/61. Min guard assist with cues on technique to stand to RW. Gait for 32 feet with min guard to min assist, close chair follow. Cues on posture, walker position and sequencing with gait. Min assist for controlled sitting into wheelchair with cues to reach back. BP 95/51. Pt transported back to room via wheelchair as he stated he could not walk anymore due to fatigue. Min guard assist with RW for stand pivot transfer back to sitting edge of bed. Seated at edge of bed pt reached down to sort out 2 bags of clothing his family dropped off for him without any balance issues and with stable BP's. He changed his scrub top to a personal t-shirt with supervision assist only. Supervision to return to supine in bed. Pt left with HOB 30 degrees, bed alarm on and all needs in reach. BP 105/63 at end of session.  Improved activity tolerance with increased gait distance this session.,  Willow Ora, PTA, CLT 08/01/18, 6:02 PM

## 2018-08-01 NOTE — Progress Notes (Signed)
Jose Garrett Progress Note   Subjective:  Seen on HD, no SOB or cough, L leg still swollen.   Objective Vitals:   07/31/18 2021 07/31/18 2304 08/01/18 0500 08/01/18 0523  BP: (!) 86/54 (!) 88/65  96/61  Pulse:  83  83  Resp:  18    Temp:    98.5 F (36.9 C)  TempSrc:    Oral  SpO2:    93%  Weight:   89 kg   Height:       Physical Exam General: Elderly man, NAD Heart: RRR; 2/6 murmur Lungs: CTA anteriorly Abdomen: soft, non-tender Extremities: No RLE edema, 2+ LLE edema Dialysis Access:  AVF + thrill   home meds:   - aspirin 81/ clopidogrel 75 hs/ atorvastatin 80   - gabapentin 300mg  am+ 900mg  hs/ oxycodone IR prn/ oxycontin 10 bid   - sucroferric oxyhydroxide 2-3 tabs ac tid   - prn's/ vitamins/ supplements    [facility] MWF 4h 400/800 86kg 2/2 P4R AVF Hep ? Sensipar 60 TIW Calcitriol 1.25 TIW Mircera 50 q 2 weeks (last 4/29)  Assessment/Plan: 1. ESRD: on HD MWF. HD today  2.  Hyperkalemia: repeat today pending.   3. PAD with LLE critical limb ischemia: S/pL fem-pop BPG. Post-op LLE edema.  4. Ischemic cardiomyopathy / severe AS / EF 35-40% - chronic hypotension related to these issues.   5. Anemia (d/t ESRD and ABLA): On aranesp and nulecit - increased both doses. SP 200 ug darbe on 5/20 here. Transfused 1 unit PRBC'son 5/13, 5/17, 5/20.  Hb recheck pending w/ HD today.   6. Secondary hyperparathyroidism: Continue home velphoro, Sensipar, calcitriol   7. Elevated troponin: S/p cath with severe single vessel CAD chronic total chronic occlusion of RCA. Medical therapy recommended. No targets for PCI.   [name]  MD 08/01/2018, 1:21 PM  Additional Objective Labs: Basic Metabolic Panel: Recent Labs  Lab 07/26/18 0233 07/28/18 1338 07/30/18 1341  NA 133* 134* 134*  K 5.4* 5.2* 5.3*  CL 94* 93* 95*  CO2 22 22 23   GLUCOSE 113* 127* 121*  BUN 84* 75* 64*  CREATININE 10.83* 9.15* 8.62*  CALCIUM 8.7* 9.1 9.0  PHOS   --  7.6* 7.1*   Liver Function Tests: Recent Labs  Lab 07/28/18 1338 07/30/18 1341  ALBUMIN 3.0* 2.8*   CBC: Recent Labs  Lab 07/26/18 0233 07/27/18 0430 07/28/18 0114 07/30/18 1342  WBC 7.6 9.0 7.8 7.9  HGB 7.2* 6.8* 7.1* 6.9*  HCT 21.4* 21.1* 22.0* 21.3*  MCV 96.0 100.5* 101.4* 103.9*  PLT 117* 125* 115* 120*   Medications: . ferric gluconate (FERRLECIT/NULECIT) IV 125 mg (07/30/18 1714)   . sodium chloride   Intravenous Once  . aspirin EC  81 mg Oral Daily  . atorvastatin  80 mg Oral Q supper  . calcitRIOL  1.25 mcg Oral Q M,W,F-HD  . Chlorhexidine Gluconate Cloth  6 each Topical Q0600  . cinacalcet  60 mg Oral Q M,W,F-HD  . clopidogrel  75 mg Oral Q supper  . darbepoetin (ARANESP) injection - DIALYSIS  200 mcg Intravenous Q Wed-HD  . docusate sodium  100 mg Oral TID  . gabapentin  300 mg Oral q morning - 10a  . heparin  5,000 Units Subcutaneous Q8H  . loratadine  10 mg Oral Daily  . pantoprazole  40 mg Oral Daily  . polyethylene glycol  17 g Oral BID  . sucroferric oxyhydroxide  1,000 mg Oral TID WC  . sucroferric oxyhydroxide  500 mg Oral With snacks  . vitamin B-12  1,000 mcg Oral Daily

## 2018-08-01 NOTE — Progress Notes (Signed)
Physical Therapy Session Note  Patient Details  Name: Jose Garrett MRN: [id number] Date of Birth: January 17, 1951  Today's Date: 08/01/2018 PT Individual Time: 0802-0858 PT Individual Time Calculation (min): 56 min   Short Term Goals: Week 1:  PT Short Term Goal 1 (Week 1): Pt will participate in 60 min of upright activity w/o increase in fatigue PT Short Term Goal 2 (Week 1): Pt will perform transfers w/ CGA PT Short Term Goal 3 (Week 1): Pt will ambulate 25' w/ supervision using LRAD    Therapy Documentation Precautions:  Precautions Precautions: Fall Restrictions Weight Bearing Restrictions: No  Treatment: Pt in bed on arrival. Complaints of being tired. BP 94/55 at this time.  Pt performed the following exercises with bil LE's: ankle pumps, quad sets with 5 sec holds for 10 reps each. Left LE only: heel slides, straight leg raises, hip abdcut/adduct and short arc quads for 10 reps each AA for controlled movements.  BP after ex's 94/53. Total assit to don bil TED hose. Supine to sit at edge of bed supervision assist with HOB at 20 degrees and rail used. Supervision for pt to scoot to edge of bed. Pt performed heel/toe raises bil LE's for 10 reps. Then with left LE only- long arc quads and marching for 10 reps actively. Cues to slow down for improved benefit. BP 93/56. Gait for 12 feet with RW with min assist, cues on sequencing, weight shifting and postre needed. BP after 82/51. Had pt perform a second set of seated ex's as listed before. BP 91/56. Second bout of gait for 12 feet with RW, min assist and cues as stated before. BP 89/56. Standing to RW with min assist and with RW support next to bed worked on achieving up right posture, then lateral weight shifting, progressing to marching in place. Min assist for controlled descent with sitting to bed surface. BP 88/59. Sitting edge of bed to supine in bed with supervision, bed flat and no rails used. Once in bed PTA performed passive  hamstring stretching of left LE for 30 sec holds x 2 reps. Then pt performed heel slides, straight leg raises for 10 reps each AAROM for controlled movements.  Pt left in bed with all needs in reach and bed alarm on. BP 94/55 at end of session. Pt with improved activity tolerance this session. Only reported pain with some ex's performed that resolved with rest. Did not want pain medicine when asked.     Therapy/Group: Individual Therapy  Lindon Romp, PTA, CLT 08/01/18, 4:39 PM

## 2018-08-02 ENCOUNTER — Inpatient Hospital Stay (HOSPITAL_COMMUNITY): Payer: Medicare Other | Admitting: Physical Therapy

## 2018-08-02 ENCOUNTER — Inpatient Hospital Stay (HOSPITAL_COMMUNITY): Payer: Medicare Other | Admitting: Occupational Therapy

## 2018-08-02 LAB — HEMOGLOBIN AND HEMATOCRIT, BLOOD
HCT: 34.3 % — ABNORMAL LOW (ref 39.0–52.0)
Hemoglobin: 11.4 g/dL — ABNORMAL LOW (ref 13.0–17.0)

## 2018-08-02 LAB — TYPE AND SCREEN
ABO/RH(D): O POS
Antibody Screen: NEGATIVE
Unit division: 0
Unit division: 0
Unit division: 0

## 2018-08-02 LAB — BPAM RBC
Blood Product Expiration Date: 202005272359
Blood Product Expiration Date: 202006202359
Blood Product Expiration Date: 202006202359
ISSUE DATE / TIME: 202005201546
ISSUE DATE / TIME: 202005201546
ISSUE DATE / TIME: 202005221424
Unit Type and Rh: 5100
Unit Type and Rh: 5100
Unit Type and Rh: 5100

## 2018-08-02 NOTE — Progress Notes (Signed)
[facility] PHYSICAL MEDICINE & REHABILITATION PROGRESS NOTE  Subjective/Complaints: Patient feels weak today, received 2 units of blood yesterday no shortness of breath  ROS: Denies CP, shortness of breath, nausea, vomiting, diarrhea.  Objective: Vital Signs: Blood pressure 104/83, pulse 87, temperature 99.3 F (37.4 C), temperature source Oral, resp. rate 16, height 5\' 10"  (1.778 m), weight 88 kg, SpO2 92 %. No results found. Recent Labs    07/30/18 1342 08/01/18 1305  WBC 7.9 5.6  HGB 6.9* 6.3*  HCT 21.3* 19.7*  PLT 120* 116*   Recent Labs    07/30/18 1341 08/01/18 1300  NA 134* 134*  K 5.3* 4.0  CL 95* 93*  CO2 23 24  GLUCOSE 121* 100*  BUN 64* 55*  CREATININE 8.62* 8.33*  CALCIUM 9.0 9.1    Physical Exam: BP 104/83 (BP Location: Left Arm)   Pulse 87   Temp 99.3 F (37.4 C) (Oral)   Resp 16   Ht 5\' 10"  (1.778 m)   Wt 88 kg   SpO2 92%   BMI 27.84 kg/m  Constitutional: No distress . Vital signs reviewed. HENT: Normocephalic.  Atraumatic. Eyes: EOMI.  No discharge. Cardiovascular: No JVD. Respiratory: Normal effort. GI: Non-distended. Musculoskeletal: Left lower extremity edema, unchanged Neurological:  Alert and oriented Follows commands.  Motor: Right lower extremity: 4/5 proximal distal, stable Left lower extremity: Hip flexion, knee extension 2-2+/5, ankle dorsiflexion 4-/5 Skin: Skin iswarm. He isnot diaphoretic. Femoropopliteal bypass site C/D/I  Left foot with ischemia on plantar toes  Psychiatric: He has a normal mood and affect. Hisbehavior is normal.Judgmentand thought contentnormal.   Assessment/Plan: 1. Functional deficits secondary to debility which require 3+ hours per day of interdisciplinary therapy in a comprehensive inpatient rehab setting.  Physiatrist is providing close team supervision and 24 hour management of active medical problems listed below.  Physiatrist and rehab team continue to assess barriers to  discharge/monitor patient progress toward functional and medical goals  Care Tool:  Bathing    Body parts bathed by patient: Right arm, Left arm, Chest, Abdomen, Front perineal area, Left upper leg, Right upper leg, Face   Body parts bathed by helper: Buttocks, Right lower leg Body parts n/a: Left lower leg   Bathing assist Assist Level: Moderate Assistance - Patient 50 - 74%     Upper Body Dressing/Undressing Upper body dressing   What is the patient wearing?: Pull over shirt    Upper body assist Assist Level: Independent    Lower Body Dressing/Undressing Lower body dressing      What is the patient wearing?: Pants     Lower body assist Assist for lower body dressing: Maximal Assistance - Patient 25 - 49%     Toileting Toileting    Toileting assist Assist for toileting: Moderate Assistance - Patient 50 - 74%     Transfers Chair/bed transfer  Transfers assist     Chair/bed transfer assist level: Minimal Assistance - Patient > 75%     Locomotion Ambulation   Ambulation assist   Ambulation activity did not occur: Safety/medical concerns  Assist level: Minimal Assistance - Patient > 75% Assistive device: Walker-rolling Max distance: 12 feet   Walk 10 feet activity   Assist  Walk 10 feet activity did not occur: Safety/medical concerns  Assist level: Minimal Assistance - Patient > 75% Assistive device: Walker-rolling   Walk 50 feet activity   Assist Walk 50 feet with 2 turns activity did not occur: Safety/medical concerns  Walk 150 feet activity   Assist Walk 150 feet activity did not occur: Safety/medical concerns         Walk 10 feet on uneven surface  activity   Assist Walk 10 feet on uneven surfaces activity did not occur: Safety/medical concerns         Wheelchair     Assist Will patient use wheelchair at discharge?: Yes Type of Wheelchair: Manual    Wheelchair assist level: Supervision/Verbal cueing Max  wheelchair distance: 150'    Wheelchair 50 feet with 2 turns activity    Assist        Assist Level: Supervision/Verbal cueing   Wheelchair 150 feet activity     Assist     Assist Level: Supervision/Verbal cueing      Medical Problem List and Plan: 1.Debilitysecondary to PAD status post left common femoral to below-knee popliteal artery bypass 8/82/8003 complicated by non-STEMI  Continue CIR, patient severely deconditioned, will check hemoglobin today, blood pressure soft but not dizzy 2. Antithrombotics: -DVT/anticoagulation:Subcutaneous heparin -antiplatelet therapy: Aspirin 81 mg daily, Plavix 75 mg daily 3. Pain Management:Neurontin 300 mg every evening, oxycodone as needed 4. Mood:Provide emotional support -antipsychotic agents: N/A 5. Neuropsych: This patientiscapable of making decisions on hisown behalf. 6. Skin/Wound Care:Routine skin checks. Local skin care, protection to necrotic areas along toes of left foot.   Will need to monitor closely for left lower extremity   Daily Dopplers and elevation per cardiovascular 7. Fluids/Electrolytes/Nutrition: Routine in and outs 8. Acute blood loss anemia. Continue Aranesp  Hemoglobin 6.9 on 5/20, transfused 1 unit in HD, no follow-up hemoglobin ordered  Continue to monitor 9.Hypotension/aortic stenosis.   Blood pressure remains low on 5/22, denies dizziness but is little weak 10. End-stage renal disease. Continue hemodialysis as directed.  Recs per nephro  11. Hyperlipidemia. Lipitor 12. Chronic combined diastolic systolic congestive heart failure with EF of 35-40% and severe left ear. Monitor for any signs of fluid overload  Daily weights ordered Filed Weights   08/01/18 1215 08/01/18 1636 08/02/18 0438  Weight: 90.2 kg 88.7 kg 88 kg   Improving on 5/22 13.Constipation. Laxative assistance 14.  Thrombocytopenia  Platelets 120 on 5/20  Continue to monitor 15.   Severe constipation  Bowel meds increased on 5/21  Will consider further increase in medications necessary 16.  Severe CAD  Single-vessel disease total occlusion of RCA  Medical management   LOS: 5 days A FACE TO FACE EVALUATION WAS PERFORMED  Charlett Blake 08/02/2018, 9:36 AM

## 2018-08-02 NOTE — Progress Notes (Signed)
Physical Therapy Session Note  Patient Details  Name: Jose Garrett MRN: [id number] Date of Birth: 1950-06-21  Today's Date: 08/02/2018 PT Individual Time: 0800-0845 PT Individual Time Calculation (min): 45 min   Short Term Goals: Week 1:  PT Short Term Goal 1 (Week 1): Pt will participate in 60 min of upright activity w/o increase in fatigue PT Short Term Goal 2 (Week 1): Pt will perform transfers w/ CGA PT Short Term Goal 3 (Week 1): Pt will ambulate 72' w/ supervision using LRAD  Skilled Therapeutic Interventions/Progress Updates:   Pt in supine and agreeable to therapy w/ encouragement, denies pain at rest. Pt in pleasant mood this morning but required max encouragement to participate. Pt stated doesn't feel any better after receiving 2 units of RBCs yesterday - made RN and MD aware. Eventually agreeable to practice curb negotiation in preparation for d/c to daughter's house. Supine>sit w/ supervision, therapist donned L TED hose w/ total assist. Per pt, he has noticed the swelling increasing in his groin. Educated on swelling progression and management, emphasized importance of ice, elevation and compression. Stand pivot transfer to w/c w/ CGA-min assist using RW, verbal cues for precautions/safety. Total assist w/c transport to/from therapy gym. Negotiated 3" curb w/ min assist going backwards w/ RW. Verbal and visual cues for technique. Pt states the step to enter daughter's house isn't as tall as this curb. Pt moving very slowly and breathing heavily w/ all active movements this session. Discussed importance of continuing to participate in therapies as much as possible. Pt verbalized understanding but continued to insist on returning to room and to supine. Returned to room and ended session in supine, LLE elevated and ice applied. Made RN aware of status. Missed 15 min of skilled PT 2/2 refusal/fatigue.   Therapy Documentation Precautions:  Precautions Precautions:  Fall Restrictions Weight Bearing Restrictions: No  Therapy/Group: Individual Therapy  Malanie Koloski K Nidia Grogan 08/02/2018, 8:45 AM

## 2018-08-02 NOTE — Progress Notes (Signed)
Occupational Therapy Session Note  Patient Details  Name: Jose Garrett MRN: [id number] Date of Birth: 22-Dec-1950  Today's Date: 08/02/2018 OT Individual Time: 1300-1400 OT Individual Time Calculation (min): 60 min    Short Term Goals: Week 1:  OT Short Term Goal 1 (Week 1): Pt will complete toilet transfer with min A and LRAD OT Short Term Goal 2 (Week 1): Pt will complete 1 step of LB dressing task OT Short Term Goal 3 (Week 1): Pt will tolerate standing for 2 minutes in preparation for BADL tasks Week 2:     Skilled Therapeutic Interventions/Progress Updates:    1:1 Self care retraining at shower level. Pt ambulated into the bathroom with RW with min A for sit to stand and contact guard for ambulation. Discussed stepping over the threshold backwards to get into the shower and practiced with simulated ledge with towel. Pt able to bathe all parts except for feet. Education provided about feet care and care for wounds and washing and drying feet well- especially on the left. RN came to assess. Pt still required min to min  A for sit to stands in prep for transfers and for LB clothing management. PT uses one UE for support when standing due to pain in left LE. Transitioned into recliner with LEs elevated at end of session.   Therapy Documentation Precautions:  Precautions Precautions: Fall Restrictions Weight Bearing Restrictions: No Pain: Ongoing soreness in left LE and groin- RN aware and allowed for rest as needed. Therapy/Group: Individual Therapy  Willeen Cass [facility] 08/02/2018, 3:21 PM

## 2018-08-02 NOTE — Progress Notes (Signed)
Physical Therapy Session Note  Patient Details  Name: Jose Garrett MRN: [id number] Date of Birth: 07-22-1950  Today's Date: 08/02/2018 PT Individual Time: 1031-5945 PT Individual Time Calculation (min): 58 min   Short Term Goals: Week 1:  PT Short Term Goal 1 (Week 1): Pt will participate in 60 min of upright activity w/o increase in fatigue PT Short Term Goal 2 (Week 1): Pt will perform transfers w/ CGA PT Short Term Goal 3 (Week 1): Pt will ambulate 36' w/ supervision using LRAD    Therapy Documentation Precautions:  Precautions Precautions: Fall Restrictions Weight Bearing Restrictions: No  Treatment: Pt in bed on arrival to room. Agreed to PT at this time. Reports feeling tired today.  BP at start of session was 104/69. Supine in bed with bil LE's- straight leg raies, then short arc quad for 10 reps each with min assist for controlled movements. Supine to sitting edge of bed with supervision, HOB 20 degrees and no rail used. Supervision at edge of bed for heel/toe raises, long arc quads for 10 reps each. Passive hamstring stretch by PTA for 60 sec's x 1 rep each leg. BP 111/65 at this time. Min guard assist to stand bed to RW with cues for hand placement and weight shifting. Gait for 40 feet with RW min guard assist with 2 episodes of lateral balance instability toward right needing min assist to correct/regain, wheelchair brought behind pt for safety as well. Cues needed for posture, weight shifting left and walker positioning/general use with all gait. Min assist for controlled descent into wheelchair. Pt then transported to rehab gym via wheelchair by PTA. Pt ascended/descended 4 stairs with bil rails, min assist with cues on correct sequencing, weight shifting and hand advancement on rails. After a seated rest break pt performed single step with RW ascending backwards, descending forwards with min guard assist. Pt able to recall correct technique/sequencing from am PT session. Pt  then transported back to room via wheelchair by PTA. Stand pivot transfer wheelchair to edge of bed with min guard assist/RW. Pt then returned to supine with supervision only.   Pt left in bed with all needs in reach and bed alarm on. BP at end of session 107/57. Pt with increased activity tolerance today. Does continue to fatigue with activity, needing rest breaks to recover.     Therapy/Group: Individual Therapy  Lindon Romp, PTA, CLT 08/02/18, 1:24 PM

## 2018-08-02 NOTE — Plan of Care (Signed)
  Problem: RH SAFETY Goal: RH STG ADHERE TO SAFETY PRECAUTIONS W/ASSISTANCE/DEVICE Description STG Adhere to Safety Precautions With Mod.Assistance/Device.  Outcome: Progressing Goal: RH STG DECREASED RISK OF FALL WITH ASSISTANCE Description STG Decreased Risk of Fall With Mod. Assistance.  Outcome: Progressing   Problem: RH PAIN MANAGEMENT Goal: RH STG PAIN MANAGED AT OR BELOW PT'S PAIN GOAL Description Less than 3  Outcome: Progressing

## 2018-08-03 ENCOUNTER — Inpatient Hospital Stay (HOSPITAL_COMMUNITY): Payer: Medicare Other | Admitting: Physical Therapy

## 2018-08-03 MED ORDER — CHLORHEXIDINE GLUCONATE CLOTH 2 % EX PADS: 6.0000 | MEDICATED_PAD | Freq: Every day | CUTANEOUS | Status: DC

## 2018-08-03 MED ORDER — SUCROFERRIC OXYHYDROXIDE 500 MG PO CHEW
500.0000 mg | CHEWABLE_TABLET | ORAL | Status: DC | PRN
  Filled 2018-08-03: qty 1

## 2018-08-03 NOTE — Progress Notes (Signed)
[place] PHYSICAL MEDICINE & REHABILITATION PROGRESS NOTE  Subjective/Complaints: Appreciate OT note.  Discussed blood work with the patient, patient states that he finds therapy quite hard and he is happy that he is not receiving therapy today  ROS: Denies CP, shortness of breath, nausea, vomiting, diarrhea.  Objective: Vital Signs: Blood pressure 101/65, pulse 80, temperature 98.3 F (36.8 C), temperature source Oral, resp. rate 16, height 5\' 10"  (1.778 m), weight 91.3 kg, SpO2 93 %. No results found. Recent Labs    08/01/18 1305 08/02/18 1419  WBC 5.6  --   HGB 6.3* 11.4*  HCT 19.7* 34.3*  PLT 116*  --    Recent Labs    08/01/18 1300  NA 134*  K 4.0  CL 93*  CO2 24  GLUCOSE 100*  BUN 55*  CREATININE 8.33*  CALCIUM 9.1    Physical Exam: BP 101/65 (BP Location: Left Arm)   Pulse 80   Temp 98.3 F (36.8 C) (Oral)   Resp 16   Ht 5\' 10"  (1.778 m)   Wt 91.3 kg   SpO2 93%   BMI 28.88 kg/m  Constitutional: No distress . Vital signs reviewed. HENT: Normocephalic.  Atraumatic. Eyes: EOMI.  No discharge. Cardiovascular: No JVD. Respiratory: Normal effort. GI: Non-distended. Musculoskeletal: Left lower extremity edema, unchanged Neurological:  Alert and oriented Follows commands.  Motor: Right lower extremity: 4/5 proximal distal, stable Left lower extremity: Hip flexion, knee extension 2-2+/5, ankle dorsiflexion 4-/5 Skin: Skin iswarm. He isnot diaphoretic. Femoropopliteal bypass site C/D/I  Left toes are warm, foot has 2+ edema, right foot without edema, toes cool dusky but not painful no skin breakdown Psychiatric: He has a normal mood and affect. Hisbehavior is normal.Judgmentand thought contentnormal.   Assessment/Plan: 1. Functional deficits secondary to debility which require 3+ hours per day of interdisciplinary therapy in a comprehensive inpatient rehab setting.  Physiatrist is providing close team supervision and 24 hour management of  active medical problems listed below.  Physiatrist and rehab team continue to assess barriers to discharge/monitor patient progress toward functional and medical goals  Care Tool:  Bathing    Body parts bathed by patient: Right arm, Left arm, Chest, Abdomen, Front perineal area, Left upper leg, Right upper leg, Face, Buttocks   Body parts bathed by helper: Right lower leg, Left lower leg Body parts n/a: Left lower leg   Bathing assist Assist Level: Contact Guard/Touching assist     Upper Body Dressing/Undressing Upper body dressing   What is the patient wearing?: Pull over shirt    Upper body assist Assist Level: Independent    Lower Body Dressing/Undressing Lower body dressing      What is the patient wearing?: Underwear/pull up, Pants     Lower body assist Assist for lower body dressing: Contact Guard/Touching assist     Toileting Toileting    Toileting assist Assist for toileting: Moderate Assistance - Patient 50 - 74%     Transfers Chair/bed transfer  Transfers assist     Chair/bed transfer assist level: Minimal Assistance - Patient > 75%     Locomotion Ambulation   Ambulation assist   Ambulation activity did not occur: Safety/medical concerns  Assist level: Minimal Assistance - Patient > 75% Assistive device: Walker-rolling Max distance: 40   Walk 10 feet activity   Assist  Walk 10 feet activity did not occur: Safety/medical concerns  Assist level: Minimal Assistance - Patient > 75% Assistive device: Walker-rolling   Walk 50 feet activity   Assist Walk  50 feet with 2 turns activity did not occur: Safety/medical concerns         Walk 150 feet activity   Assist Walk 150 feet activity did not occur: Safety/medical concerns         Walk 10 feet on uneven surface  activity   Assist Walk 10 feet on uneven surfaces activity did not occur: Safety/medical concerns         Wheelchair     Assist Will patient use wheelchair  at discharge?: Yes Type of Wheelchair: Manual    Wheelchair assist level: Supervision/Verbal cueing Max wheelchair distance: 150'    Wheelchair 50 feet with 2 turns activity    Assist        Assist Level: Supervision/Verbal cueing   Wheelchair 150 feet activity     Assist     Assist Level: Supervision/Verbal cueing      Medical Problem List and Plan: 1.Debilitysecondary to PAD status post left common femoral to below-knee popliteal artery bypass 0/56/9794 complicated by non-STEMI  Continue CIR, repeat hemoglobin shows a greater than expected rise 2. Antithrombotics: -DVT/anticoagulation:Subcutaneous heparin -antiplatelet therapy: Aspirin 81 mg daily, Plavix 75 mg daily 3. Pain Management:Neurontin 300 mg every evening, oxycodone as needed 4. Mood:Provide emotional support -antipsychotic agents: N/A 5. Neuropsych: This patientiscapable of making decisions on hisown behalf. 6. Skin/Wound Care:Routine skin checks. Local skin care, protection to necrotic areas along toes of left foot.  Left toes are warm right toes are cool and dusky but not painful  Will need to monitor closely for left lower extremity   Daily Dopplers and elevation per cardiovascular 7. Fluids/Electrolytes/Nutrition: Routine in and outs 8. Acute blood loss anemia. Continue Aranesp  Hemoglobin 6.9 on 5/20, transfused 1 unit in HD, no follow-up hemoglobin ordered  Continue to monitor 9.Hypotension/aortic stenosis.   Blood pressure remains low on 5/22, denies dizziness but is little weak 10. End-stage renal disease. Continue hemodialysis as directed.  Recs per nephro  11. Hyperlipidemia. Lipitor 12. Chronic combined diastolic systolic congestive heart failure with EF of 35-40% and severe left ear. Monitor for any signs of fluid overload  Daily weights ordered Filed Weights   08/01/18 1636 08/02/18 0438 08/03/18 0525  Weight: 88.7 kg 88 kg 91.3 kg    Elevated today, monitor for trend 13.Constipation. Laxative assistance 14.  Thrombocytopenia  Platelets 120 on 5/20  Continue to monitor 15.  Severe constipation no abdominal pain  Bowel meds increased on 5/21  Will consider further increase in medications necessary 16.  Severe CAD  Single-vessel disease total occlusion of RCA  Medical management   LOS: 6 days A FACE TO FACE EVALUATION WAS PERFORMED  Charlett Blake 08/03/2018, 9:04 AM

## 2018-08-04 ENCOUNTER — Inpatient Hospital Stay (HOSPITAL_COMMUNITY): Payer: Medicare Other | Admitting: Occupational Therapy

## 2018-08-04 ENCOUNTER — Inpatient Hospital Stay (HOSPITAL_COMMUNITY): Payer: Medicare Other | Admitting: Physical Therapy

## 2018-08-04 LAB — CBC WITH DIFFERENTIAL/PLATELET
Abs Immature Granulocytes: 0.09 10*3/uL — ABNORMAL HIGH (ref 0.00–0.07)
Basophils Absolute: 0 10*3/uL (ref 0.0–0.1)
Basophils Relative: 0 %
Eosinophils Absolute: 0.1 10*3/uL (ref 0.0–0.5)
Eosinophils Relative: 1 %
HCT: 35.4 % — ABNORMAL LOW (ref 39.0–52.0)
Hemoglobin: 11.6 g/dL — ABNORMAL LOW (ref 13.0–17.0)
Immature Granulocytes: 1 %
Lymphocytes Relative: 6 %
Lymphs Abs: 0.7 10*3/uL (ref 0.7–4.0)
MCH: 32.3 pg (ref 26.0–34.0)
MCHC: 32.8 g/dL (ref 30.0–36.0)
MCV: 98.6 fL (ref 80.0–100.0)
Monocytes Absolute: 0.5 10*3/uL (ref 0.1–1.0)
Monocytes Relative: 5 %
Neutro Abs: 10.3 10*3/uL — ABNORMAL HIGH (ref 1.7–7.7)
Neutrophils Relative %: 87 %
Platelets: 121 10*3/uL — ABNORMAL LOW (ref 150–400)
RBC: 3.59 MIL/uL — ABNORMAL LOW (ref 4.22–5.81)
RDW: 17.2 % — ABNORMAL HIGH (ref 11.5–15.5)
WBC: 11.7 10*3/uL — ABNORMAL HIGH (ref 4.0–10.5)
nRBC: 0 % (ref 0.0–0.2)

## 2018-08-04 LAB — RENAL FUNCTION PANEL
Albumin: 2.9 g/dL — ABNORMAL LOW (ref 3.5–5.0)
Anion gap: 18 — ABNORMAL HIGH (ref 5–15)
BUN: 71 mg/dL — ABNORMAL HIGH (ref 8–23)
CO2: 21 mmol/L — ABNORMAL LOW (ref 22–32)
Calcium: 8.9 mg/dL (ref 8.9–10.3)
Chloride: 96 mmol/L — ABNORMAL LOW (ref 98–111)
Creatinine, Ser: 10.2 mg/dL — ABNORMAL HIGH (ref 0.61–1.24)
GFR calc Af Amer: 5 mL/min — ABNORMAL LOW (ref >60)
GFR calc non Af Amer: 5 mL/min — ABNORMAL LOW (ref >60)
Glucose, Bld: 118 mg/dL — ABNORMAL HIGH (ref 70–99)
Phosphorus: 4.9 mg/dL — ABNORMAL HIGH (ref 2.5–4.6)
Potassium: 4.6 mmol/L (ref 3.5–5.1)
Sodium: 135 mmol/L (ref 135–145)

## 2018-08-04 LAB — CBC
HCT: 33.4 % — ABNORMAL LOW (ref 39.0–52.0)
Hemoglobin: 10.8 g/dL — ABNORMAL LOW (ref 13.0–17.0)
MCH: 32.1 pg (ref 26.0–34.0)
MCHC: 32.3 g/dL (ref 30.0–36.0)
MCV: 99.4 fL (ref 80.0–100.0)
Platelets: 128 10*3/uL — ABNORMAL LOW (ref 150–400)
RBC: 3.36 MIL/uL — ABNORMAL LOW (ref 4.22–5.81)
RDW: 17.4 % — ABNORMAL HIGH (ref 11.5–15.5)
WBC: 11.6 10*3/uL — ABNORMAL HIGH (ref 4.0–10.5)
nRBC: 0 % (ref 0.0–0.2)

## 2018-08-04 NOTE — Progress Notes (Signed)
[facility] Progress Note   Subjective:  Indigestion today  Objective Vitals:   08/03/18 1941 08/04/18 0500 08/04/18 0602 08/04/18 0840  BP: 96/60  (!) 107/57   Pulse: 85  83   Resp: 16  16   Temp: 98.6 F (37 C)  98 F (36.7 C) 98.9 F (37.2 C)  TempSrc: Oral  Oral Oral  SpO2: 98%  98%   Weight:  89.6 kg 89.9 kg   Height:       Physical Exam General: Elderly man, NAD Heart: RRR; 2/6 murmur Lungs: CTA anteriorly Abdomen: soft, non-tender Extremities: No RLE edema, 2+ LLE edema Dialysis Access:  AVF + thrill   home meds:   - aspirin 81/ clopidogrel 75 hs/ atorvastatin 80   - gabapentin 300mg  am+ 900mg  hs/ oxycodone IR prn/ oxycontin 10 bid   - sucroferric oxyhydroxide 2-3 tabs ac tid   - prn's/ vitamins/ supplements    [facility] MWF 4h 400/800 86kg 2/2 P4R AVF Hep ? Sensipar 60 TIW Calcitriol 1.25 TIW Mircera 50 q 2 weeks (last 4/29)  Assessment/Plan: 1. ESRD: on HD MWF. HD today. Up 4kg, UF 2-3 as tolerated.   2.  Hyperkalemia: repeat today pending.   3. PAD with LLE critical limb ischemia: S/pL fem-pop BPG. Post-op LLE edema.  4. Ischemic cardiomyopathy / severe AS / EF 35-40% - chronic hypotension related to these issues.   5. Anemia (d/t ESRD and ABLA): On aranesp and nulecit - increased both doses. SP 200 ug darbe on 5/20 here. Transfused 1 unit PRBC'son 5/13, 5/17, 5/20.    6. Secondary hyperparathyroidism: Continue home velphoro, Sensipar, calcitriol   7. Elevated troponin: S/p cath with severe single vessel CAD chronic total chronic occlusion of RCA. Medical therapy recommended. No targets for PCI.   [name]  MD 08/01/2018, 1:21 PM  Additional Objective Labs: Basic Metabolic Panel: Recent Labs  Lab 07/28/18 1338 07/30/18 1341 08/01/18 1300  NA 134* 134* 134*  K 5.2* 5.3* 4.0  CL 93* 95* 93*  CO2 22 23 24   GLUCOSE 127* 121* 100*  BUN 75* 64* 55*  CREATININE 9.15* 8.62* 8.33*  CALCIUM 9.1 9.0 9.1   PHOS 7.6* 7.1* 5.6*   Liver Function Tests: Recent Labs  Lab 07/28/18 1338 07/30/18 1341 08/01/18 1300  ALBUMIN 3.0* 2.8* 2.8*   CBC: Recent Labs  Lab 07/30/18 1342 08/01/18 1305 08/02/18 1419 08/04/18 1110  WBC 7.9 5.6  --  11.7*  NEUTROABS  --   --   --  10.3*  HGB 6.9* 6.3* 11.4* 11.6*  HCT 21.3* 19.7* 34.3* 35.4*  MCV 103.9* 102.6*  --  98.6  PLT 120* 116*  --  121*   Medications: . ferric gluconate (FERRLECIT/NULECIT) IV 125 mg (07/30/18 1714)   . sodium chloride   Intravenous Once  . sodium chloride   Intravenous Once  . aspirin EC  81 mg Oral Daily  . atorvastatin  80 mg Oral Q supper  . calcitRIOL  1.25 mcg Oral Q M,W,F-HD  . Chlorhexidine Gluconate Cloth  6 each Topical Q0600  . Chlorhexidine Gluconate Cloth  6 each Topical Q0600  . cinacalcet  60 mg Oral Q M,W,F-HD  . clopidogrel  75 mg Oral Q supper  . darbepoetin (ARANESP) injection - DIALYSIS  200 mcg Intravenous Q Wed-HD  . docusate sodium  100 mg Oral TID  . gabapentin  300 mg Oral q morning - 10a  . heparin  5,000 Units Subcutaneous Q8H  . loratadine  10  mg Oral Daily  . pantoprazole  40 mg Oral Daily  . polyethylene glycol  17 g Oral BID  . sucroferric oxyhydroxide  1,000 mg Oral TID WC  . vitamin B-12  1,000 mcg Oral Daily

## 2018-08-04 NOTE — Progress Notes (Signed)
[place] PHYSICAL MEDICINE & REHABILITATION PROGRESS NOTE  Subjective/Complaints: Patient seen sitting up in bed this morning.  He states he slept well overnight.  He states a good weekend.  He states that he had the best day that he has had since being here yesterday.  This morning he states he is cold.  Discussed with nursing, will will repeat vital signs.  ROS: + Chills.  Denies CP, shortness of breath, nausea, vomiting, diarrhea.  Objective: Vital Signs: Blood pressure (!) 107/57, pulse 83, temperature 98.9 F (37.2 C), temperature source Oral, resp. rate 16, height 5\' 10"  (1.778 m), weight 89.9 kg, SpO2 98 %. No results found. Recent Labs    08/01/18 1305 08/02/18 1419  WBC 5.6  --   HGB 6.3* 11.4*  HCT 19.7* 34.3*  PLT 116*  --    Recent Labs    08/01/18 1300  NA 134*  K 4.0  CL 93*  CO2 24  GLUCOSE 100*  BUN 55*  CREATININE 8.33*  CALCIUM 9.1    Physical Exam: BP (!) 107/57 (BP Location: Left Arm)   Pulse 83   Temp 98.9 F (37.2 C) (Oral)   Resp 16   Ht 5\' 10"  (1.778 m)   Wt 89.9 kg   SpO2 98%   BMI 28.44 kg/m  Constitutional: No distress . Vital signs reviewed. HENT: Normocephalic.  Atraumatic. Eyes: EOMI.  No discharge. Cardiovascular: No JVD. Respiratory: Normal effort. GI: Non-distended. Musculoskeletal: Left lower extremity edema, stable Neurological:  Alert and oriented Follows commands.  Motor: Right lower extremity: 4/5 proximal distal, unchanged Left lower extremity: Hip flexion, knee extension 2-2+/5, ankle dorsiflexion 4-/5, unchanged Skin: Skin iswarm. He isnot diaphoretic. Femoropopliteal bypass site C/D/I  Psychiatric: He has a normal mood and affect. Hisbehavior is normal.Judgmentand thought contentnormal.   Assessment/Plan: 1. Functional deficits secondary to debility which require 3+ hours per day of interdisciplinary therapy in a comprehensive inpatient rehab setting.  Physiatrist is providing close team supervision  and 24 hour management of active medical problems listed below.  Physiatrist and rehab team continue to assess barriers to discharge/monitor patient progress toward functional and medical goals  Care Tool:  Bathing    Body parts bathed by patient: Right arm, Left arm, Chest, Abdomen, Front perineal area, Left upper leg, Right upper leg, Face, Buttocks   Body parts bathed by helper: Right lower leg, Left lower leg Body parts n/a: Left lower leg   Bathing assist Assist Level: Contact Guard/Touching assist     Upper Body Dressing/Undressing Upper body dressing   What is the patient wearing?: Pull over shirt    Upper body assist Assist Level: Independent    Lower Body Dressing/Undressing Lower body dressing      What is the patient wearing?: Underwear/pull up, Pants     Lower body assist Assist for lower body dressing: Contact Guard/Touching assist     Toileting Toileting    Toileting assist Assist for toileting: Moderate Assistance - Patient 50 - 74%     Transfers Chair/bed transfer  Transfers assist     Chair/bed transfer assist level: Minimal Assistance - Patient > 75%     Locomotion Ambulation   Ambulation assist   Ambulation activity did not occur: Safety/medical concerns  Assist level: Minimal Assistance - Patient > 75% Assistive device: Walker-rolling Max distance: 40   Walk 10 feet activity   Assist  Walk 10 feet activity did not occur: Safety/medical concerns  Assist level: Minimal Assistance - Patient > 75% Assistive device:  Walker-rolling   Walk 50 feet activity   Assist Walk 50 feet with 2 turns activity did not occur: Safety/medical concerns         Walk 150 feet activity   Assist Walk 150 feet activity did not occur: Safety/medical concerns         Walk 10 feet on uneven surface  activity   Assist Walk 10 feet on uneven surfaces activity did not occur: Safety/medical concerns         Wheelchair     Assist  Will patient use wheelchair at discharge?: Yes Type of Wheelchair: Manual    Wheelchair assist level: Supervision/Verbal cueing Max wheelchair distance: 150'    Wheelchair 50 feet with 2 turns activity    Assist        Assist Level: Supervision/Verbal cueing   Wheelchair 150 feet activity     Assist     Assist Level: Supervision/Verbal cueing      Medical Problem List and Plan: 1.Debilitysecondary to PAD status post left common femoral to below-knee popliteal artery bypass 8/67/6720 complicated by non-STEMI  Continue CIR  Weekend notes reviewed-stable 2. Antithrombotics: -DVT/anticoagulation:Subcutaneous heparin -antiplatelet therapy: Aspirin 81 mg daily, Plavix 75 mg daily 3. Pain Management:Neurontin 300 mg every evening, oxycodone as needed 4. Mood:Provide emotional support -antipsychotic agents: N/A 5. Neuropsych: This patientiscapable of making decisions on hisown behalf. 6. Skin/Wound Care:Routine skin checks. Local skin care, protection to necrotic areas along toes of left foot.  Left toes are warm right toes are cool and dusky but not painful  Will need to monitor closely for left lower extremity   Daily Dopplers and elevation per cardiovascular 7. Fluids/Electrolytes/Nutrition: Routine in and outs 8. Acute blood loss anemia. Continue Aranesp  Hemoglobin 11.4 on 5/23,?  Reliability ordered may be the cause of chills as well  Continue to monitor 9.Hypotension/aortic stenosis.   Blood pressure remains relatively low, but asymptomatic on 5/25 10. End-stage renal disease. Continue hemodialysis as directed.  Recs per nephro  11. Hyperlipidemia. Lipitor 12. Chronic combined diastolic systolic congestive heart failure with EF of 35-40% and severe left ear. Monitor for any signs of fluid overload  Daily weights ordered Filed Weights   08/03/18 0525 08/04/18 0500 08/04/18 0602  Weight: 91.3 kg 89.6 kg 89.9 kg    Stable on 5/25 13.Constipation. Laxative assistance 14.  Thrombocytopenia  Platelets 116 on 5/22  Continue to monitor 15.  Severe constipation  Bowel meds increased on 5/21  Will consider further increase in medications necessary 16.  Severe CAD  Single-vessel disease total occlusion of RCA  Medical management   LOS: 7 days A FACE TO FACE EVALUATION WAS PERFORMED  Ankit Lorie Phenix 08/04/2018, 10:09 AM

## 2018-08-04 NOTE — Plan of Care (Signed)
  Problem: Consults Goal: RH GENERAL PATIENT EDUCATION Description See Patient Education module for education specifics. Outcome: Progressing   Problem: RH BOWEL ELIMINATION Goal: RH STG MANAGE BOWEL WITH ASSISTANCE Description STG Manage Bowel with min.Assistance.  Outcome: Progressing   Problem: RH SKIN INTEGRITY Goal: RH STG SKIN FREE OF INFECTION/BREAKDOWN Description With mod. assist  Outcome: Progressing Goal: RH STG MAINTAIN SKIN INTEGRITY WITH ASSISTANCE Description STG Maintain Skin Integrity With mod.Assistance.  Outcome: Progressing   Problem: RH SAFETY Goal: RH STG DECREASED RISK OF FALL WITH ASSISTANCE Description STG Decreased Risk of Fall With Mod. Assistance.  Outcome: Progressing   Problem: RH PAIN MANAGEMENT Goal: RH STG PAIN MANAGED AT OR BELOW PT'S PAIN GOAL Description Less than 3  Outcome: Progressing   Problem: RH KNOWLEDGE DEFICIT GENERAL Goal: RH STG INCREASE KNOWLEDGE OF SELF CARE AFTER HOSPITALIZATION Description Pt. Will be able to verbalized how to manage his medications and post op. Care at home   Outcome: Progressing

## 2018-08-04 NOTE — Progress Notes (Signed)
Occupational Therapy Session Note  Patient Details  Name: Jose Garrett MRN: [id number] Date of Birth: 10/26/1950  Today's Date: 08/04/2018 OT Individual Time: 0738-0747 OT Individual Time Calculation (min): 9 min  and Today's Date: 08/04/2018 OT Missed Time: 36 Minutes Missed Time Reason: Patient fatigue;Patient unwilling/refused to participate without medical reason   Short Term Goals: Week 1:  OT Short Term Goal 1 (Week 1): Pt will complete toilet transfer with min A and LRAD OT Short Term Goal 2 (Week 1): Pt will complete 1 step of LB dressing task OT Short Term Goal 3 (Week 1): Pt will tolerate standing for 2 minutes in preparation for BADL tasks  Skilled Therapeutic Interventions/Progress Updates:   Pt greeted semi-reclined in bed. He stated he did not sleep well last night 2/2 indigestion and was very tired this morning. OT encouraged pt to get out of bed and sit up for breakfast. Pt declined any OOB activity despite encouragement. OT reviewed patient progress and OT goals-but continued to decline. Pt stated he will try again later. Pt left semi-reclined in bed with bed alarm on and needs met.   Therapy Documentation Precautions:  Precautions Precautions: Fall Restrictions Weight Bearing Restrictions: No General: General OT Amount of Missed Time: 36 Minutes PT Missed Treatment Reason: Patient unwilling to participate;Patient fatigue Pain:  denies pain   Therapy/Group: Individual Therapy  Elisabeth S Doe 08/04/2018, 12:34 PM 

## 2018-08-04 NOTE — Progress Notes (Signed)
Physical Therapy Session Note  Patient Details  Name: Jose Garrett MRN: [id number] Date of Birth: 06-30-1950  Today's Date: 08/04/2018 PT Missed Time: 75 Minutes Missed Time Reason: Patient unwilling to participate;Patient fatigue  Made multiple attempts to visit pt for therapy. Pt in supine and refusing to participate 2/2 fatigue and overall feeling unwell. Pt unable to explain how exactly he was feeling despite multiple attempts to engage, reported having no appetite and breakfast noted to be untouched. Pt minimally responding to this therapist and kept eyes closed during both attempts. RN aware. Missed 75 min of skilled PT 2/2 refusal.   Tamerra Merkley K Tashona Calk 08/04/2018, 12:26 PM

## 2018-08-04 NOTE — Progress Notes (Signed)
Occupational Therapy Session Note  Patient Details  Name: Jose Garrett MRN: [id number] Date of Birth: [birth year]  Today's Date: 08/04/2018 OT Missed Time: 47 Minutes Missed Time Reason: Patient unwilling/refused to participate without medical reason;Patient fatigue   Short Term Goals: Week 1:  OT Short Term Goal 1 (Week 1): Pt will complete toilet transfer with min A and LRAD OT Short Term Goal 2 (Week 1): Pt will complete 1 step of LB dressing task OT Short Term Goal 3 (Week 1): Pt will tolerate standing for 2 minutes in preparation for BADL tasks  Skilled Therapeutic Interventions/Progress Updates:    Pt greeted semi-reclined in bed. Pt looked better than he did this am, but continued to decline therapy "right now." Informed pt he has dialysis later and OT would not be able to return in time. Pt agreeable to try to sit up when his lunch gets here, but declined anything with therapist. Pt's mood seems low with more flat affect. Will discuss with team getting a possible neuropsych evaluation and follow up per plan of care.    Therapy Documentation Precautions:  Precautions Precautions: Fall Restrictions Weight Bearing Restrictions: No General: General OT Amount of Missed Time: 45 Minutes PT Missed Treatment Reason: Patient unwilling to participate;Patient fatigue Pain:  denies pain  Therapy/Group: Individual Therapy  Valma Cava 08/04/2018, 12:39 PM

## 2018-08-05 ENCOUNTER — Inpatient Hospital Stay (HOSPITAL_COMMUNITY): Payer: Medicare Other | Admitting: Occupational Therapy

## 2018-08-05 ENCOUNTER — Inpatient Hospital Stay (HOSPITAL_COMMUNITY): Payer: Medicare Other

## 2018-08-05 ENCOUNTER — Inpatient Hospital Stay (HOSPITAL_COMMUNITY): Payer: Medicare Other | Admitting: Physical Therapy

## 2018-08-05 ENCOUNTER — Encounter (HOSPITAL_COMMUNITY): Payer: Medicare Other | Admitting: Psychology

## 2018-08-05 DIAGNOSIS — K219 Gastro-esophageal reflux disease without esophagitis: Secondary | ICD-10-CM

## 2018-08-05 MED ORDER — CALCIUM CARBONATE ANTACID 500 MG PO CHEW
1.0000 | CHEWABLE_TABLET | Freq: Every day | ORAL | Status: DC | PRN
  Administered 2018-08-09: 200 mg via ORAL
  Filled 2018-08-05: qty 1

## 2018-08-05 MED ORDER — CHLORHEXIDINE GLUCONATE CLOTH 2 % EX PADS
6.0000 | MEDICATED_PAD | Freq: Every day | CUTANEOUS | Status: DC
  Administered 2018-08-07 – 2018-08-08 (×3): 6 via TOPICAL

## 2018-08-05 NOTE — Progress Notes (Signed)
[facility] PHYSICAL MEDICINE & REHABILITATION PROGRESS NOTE  Subjective/Complaints: Patient seen sitting up in bed this morning.  He states he slept well overnight.  He states he feels much better than yesterday.  ROS: Denies CP, shortness of breath, nausea, vomiting, diarrhea.  Objective: Vital Signs: Blood pressure (!) 92/47, pulse 83, temperature 98.1 F (36.7 C), temperature source Oral, resp. rate 18, height 5\' 10"  (1.778 m), weight 86.9 kg, SpO2 99 %. No results found. Recent Labs    08/04/18 1110 08/04/18 1512  WBC 11.7* 11.6*  HGB 11.6* 10.8*  HCT 35.4* 33.4*  PLT 121* 128*   Recent Labs    08/04/18 1513  NA 135  K 4.6  CL 96*  CO2 21*  GLUCOSE 118*  BUN 71*  CREATININE 10.20*  CALCIUM 8.9    Physical Exam: BP (!) 92/47 (BP Location: Left Arm)   Pulse 83   Temp 98.1 F (36.7 C) (Oral)   Resp 18   Ht 5\' 10"  (1.778 m)   Wt 86.9 kg   SpO2 99%   BMI 27.49 kg/m  Constitutional: No distress . Vital signs reviewed. HENT: Normocephalic.  Atraumatic. Eyes: EOMI.  No discharge. Cardiovascular: No JVD. Respiratory: Normal effort. GI: Non-distended. Musculoskeletal: Left lower extremity >right lower extremity edema Neurological: Alert and oriented Follows commands.  Motor: Right lower extremity: 4/5 proximal distal, stable Left lower extremity: Hip flexion, knee extension 2-2+/5, ankle dorsiflexion 4-/5, stable Skin: Skin iswarm. He isnot diaphoretic. Femoropopliteal bypass site C/D/I  Psychiatric: He has a normal mood and affect. Hisbehavior is normal.Judgmentand thought contentnormal.   Assessment/Plan: 1. Functional deficits secondary to debility which require 3+ hours per day of interdisciplinary therapy in a comprehensive inpatient rehab setting.  Physiatrist is providing close team supervision and 24 hour management of active medical problems listed below.  Physiatrist and rehab team continue to assess barriers to discharge/monitor patient  progress toward functional and medical goals  Care Tool:  Bathing    Body parts bathed by patient: Right arm, Left arm, Chest, Abdomen, Front perineal area, Left upper leg, Right upper leg, Face, Buttocks   Body parts bathed by helper: Right lower leg, Left lower leg Body parts n/a: Left lower leg   Bathing assist Assist Level: Contact Guard/Touching assist     Upper Body Dressing/Undressing Upper body dressing   What is the patient wearing?: Pull over shirt    Upper body assist Assist Level: Independent    Lower Body Dressing/Undressing Lower body dressing      What is the patient wearing?: Underwear/pull up, Pants     Lower body assist Assist for lower body dressing: Contact Guard/Touching assist     Toileting Toileting    Toileting assist Assist for toileting: Moderate Assistance - Patient 50 - 74%     Transfers Chair/bed transfer  Transfers assist     Chair/bed transfer assist level: Minimal Assistance - Patient > 75%     Locomotion Ambulation   Ambulation assist   Ambulation activity did not occur: Safety/medical concerns  Assist level: Minimal Assistance - Patient > 75% Assistive device: Walker-rolling Max distance: 40   Walk 10 feet activity   Assist  Walk 10 feet activity did not occur: Safety/medical concerns  Assist level: Minimal Assistance - Patient > 75% Assistive device: Walker-rolling   Walk 50 feet activity   Assist Walk 50 feet with 2 turns activity did not occur: Safety/medical concerns         Walk 150 feet activity   Assist  Walk 150 feet activity did not occur: Safety/medical concerns         Walk 10 feet on uneven surface  activity   Assist Walk 10 feet on uneven surfaces activity did not occur: Safety/medical concerns         Wheelchair     Assist Will patient use wheelchair at discharge?: Yes Type of Wheelchair: Manual    Wheelchair assist level: Supervision/Verbal cueing Max wheelchair  distance: 150'    Wheelchair 50 feet with 2 turns activity    Assist        Assist Level: Supervision/Verbal cueing   Wheelchair 150 feet activity     Assist     Assist Level: Supervision/Verbal cueing      Medical Problem List and Plan: 1.Debilitysecondary to PAD status post left common femoral to below-knee popliteal artery bypass 03/26/7260 complicated by non-STEMI  Continue CIR 2. Antithrombotics: -DVT/anticoagulation:Subcutaneous heparin -antiplatelet therapy: Aspirin 81 mg daily, Plavix 75 mg daily 3. Pain Management:Neurontin 300 mg every evening, oxycodone as needed 4. Mood:Provide emotional support -antipsychotic agents: N/A 5. Neuropsych: This patientiscapable of making decisions on hisown behalf. 6. Skin/Wound Care:Routine skin checks. Local skin care, protection to necrotic areas along toes of left foot.  Left toes are warm right toes are cool and dusky but not painful  Will need to monitor closely for left lower extremity   Daily Dopplers and elevation per cardiovascular 7. Fluids/Electrolytes/Nutrition: Routine in and outs 8. Acute blood loss anemia. Continue Aranesp   Hemoglobin 10.8 on 5/25  Continue to monitor 9.Hypotension/aortic stenosis.   Blood pressures low, but asymptomatic on 5/26 10. End-stage renal disease. Continue hemodialysis as directed.  Recs per nephro  11. Hyperlipidemia. Lipitor 12. Chronic combined diastolic systolic congestive heart failure with EF of 35-40% and severe left ear. Monitor for any signs of fluid overload  Daily weights ordered [facility] Weights   08/04/18 1355 08/04/18 1809 08/05/18 0546  Weight: 90 kg 86.5 kg 86.9 kg   Stable on 5/26 13.Constipation. Laxative assistance 14.  Thrombocytopenia  Platelets 128 on 5/25  Continue to monitor 15.  Severe constipation  Bowel meds increased on 5/21  Will consider further increase in medications necessary 16.  Severe  CAD  Single-vessel disease total occlusion of RCA  Medical management 17.  Leukocytosis  WBCs 11.6 on 5/25  Afebrile  Continue to monitor 18. GERD  Cont Protonix  TUMS prn started on 5/26  LOS: 8 days A FACE TO FACE EVALUATION WAS PERFORMED  Amir Fick Lorie Phenix 08/05/2018, 8:41 AM

## 2018-08-05 NOTE — Consult Note (Signed)
Neuropsychological Consultation   Patient:   Jose Garrett   DOB:   01-Feb-1951  MR Number:  [id number]  Location:  [facility] [address] CENTER [address] [place] [id number] [place] [address] Dept: [place]: [phone]           Date of Service:   08/05/2018  Start Time:   2 PM End Time:   3 PM  Provider/Observer:  Ilean Skill, Psy.D.       Clinical Neuropsychologist       Billing Code/Service: 51761   Chief Complaint:    Jose Garrett is a 68 year old male with history of remote tobacco abuse 7 years ago.  ESRD, aortic stenosis, hypertension, combined diastolic and systolic CHF.  Presented 07/22/2018 with progressive discoloration and ischemia in his toes on left sided.  Underwent left common femoral to below-knee popliteal artery bypass 07/22/2018.  Patient with a number medical issues with recovery.  Patient developed debility and referred to [facility] program.    Reason for Service:  The patient was referred for neuropsychological consultation due to coping and adjustment issues.  Below is the full HPI for the current admission.  Jose Garrett a 68 year old right-handed male with history of remote tobacco abuse 7 years ago. End-stage renal disease due to cardiorenal syndrome in the setting of AL amyloidosis with hemodialysis, aortic stenosis, hypertension, combined diastolic and systolic congestive heart failure followed by Dr. Angelena Form. Per chart review he lives alone used a cane for ambulation. 1 level home 3 steps to entry. His daughter plans to stay with him on discharge and assist as needed. Presented 07/22/2018 with progressive discoloration and ischemia in his toes on the left side. Underwent noninvasive studies which detected noncompressible vessels with no flow detected in the left toes and dampened flow in the right toes. Underwent outpatient PV angiogram showing occlusion of bilateral SFAs but  no gangrenous changes on the left side. He was felt to be a candidate for surgical intervention. Underwent left common femoral to below-knee popliteal artery bypass 07/22/2018 per Dr. Servando Snare. Renal service follow-up for hemodialysis ongoing. Hospital course with hypotension. EKG showed ST with RBBB suspect non-STEMI. Cardiology services consulted noted troponin 0.09, hemoglobin 6.8-7.3, potassium 5.9. He was transfused 1 unit packed red blood cells. Underwent cardiac catheterization 07/24/2018 showed chronic total occlusion of RCA with left to right collaterals. Echocardiogram with ejection fraction of 40% moderately increased left ventricular wall thickness with reduced systolic function. Initially required IV heparin transition to aspirin and Plavix. Close monitoring for asymptomatic hypotension. Subcutaneous heparin was later initiated for DVT prophylaxis. Therapy evaluations completed and patient was admitted for a comprehensive rehab program.  Current Status:  Patient reports that his mood is stable and while he has been stressed and upset about current medical issues and how he is going to cope once he goes home.    Behavioral Observation: Jose Garrett  presents as a 68 y.o.-year-old Right Caucasian Male who appeared his stated age. his dress was Appropriate and he was Well Groomed and his manners were Appropriate to the situation.  his participation was indicative of Appropriate and Attentive behaviors.  There were any physical disabilities noted.  he displayed an appropriate level of cooperation and motivation.     Interactions:    Active Appropriate and Attentive  Attention:   within normal limits and attention span and concentration were age appropriate  Memory:   within normal limits; recent and  remote memory intact  Visuo-spatial:  within normal limits  Speech (Volume):  normal  Speech:   normal; normal  Thought Process:  Coherent and Relevant  Though  Content:  WNL; not suicidal and not homicidal  Orientation:   person, place, time/date and situation  Judgment:   Good  Planning:   Good  Affect:    Anxious  Mood:    Dysphoric  Insight:   Good  Intelligence:   normal   Medical History:   Past Medical History:  Diagnosis Date  . Amyloidosis ([place])   . Anemia Dx 2016  . Anxiety    "especially with procedure"- 07/21/2018  . CAD (coronary artery disease)    a. cardiac cath on 11/25/17 with occlusion of the RCA which could have been his event but the RCA could not be engaged. The RCA filled distally from left to right collaterals. There were no severe stenoses in the left coronary system. Medical therapy recommended, started on plavix.  . Chronic combined systolic and diastolic CHF (congestive heart failure) ([place])    a. EF dropped to 35-40% with grade 2 DD by echo 11/2017.  [name] Diarrhea   . ESRD (end stage renal disease) ([place])    ESRD- HEMO MWF- [employer]  . Family history of adverse reaction to anesthesia    "my daughter can't take certain anesthesia agents" (09/08/2014)  . GERD (gastroesophageal reflux disease)   . Heart murmur   . History of blood transfusion 09/08/2014   "got hematoma after renal biopsy & HgB dropped"  . Hyperlipidemia Dx 2012  . Hypertension Dx 2012  . Myocardial infarction ([place]) 11/2017  . Neuropathy   . Peripheral vascular disease ([place])   . Restless legs         Psychiatric History:  Patient has past history of anxiety and stress over medical issues.    Family Med/Psych History:  Family History  Problem Relation Age of Onset  . Hypertension Mother   . Diabetes Mother   . Heart disease Mother   . Skin cancer Mother   . Hypertension Father   . Diabetes Father   . Diabetes Sister     Risk of Suicide/Violence: low   Impression/DX:  Jose Garrett is a 69 year old male with history of remote tobacco abuse 7 years ago.  ESRD, aortic stenosis, hypertension, combined diastolic and systolic CHF.   Presented 07/22/2018 with progressive discoloration and ischemia in his toes on left sided.  Underwent left common femoral to below-knee popliteal artery bypass 07/22/2018.  Patient with a number medical issues with recovery.  Patient developed debility and referred to CIR program.     Disposition/Plan:  Patient to be discharged on 5/30  Diagnosis:    Debility - Plan: Ambulatory referral to Physical Medicine Rehab         Electronically Signed   _______________________ Ilean Skill, Psy.D.

## 2018-08-05 NOTE — Progress Notes (Signed)
Occupational Therapy Session Note  Patient Details  Name: Jose Garrett MRN: [id number] Date of Birth: February 06, 1951  Today's Date: 08/05/2018 OT Individual Time: 1749-4496 OT Individual Time Calculation (min): 30 min    Short Term Goals: Week 2:  OT Short Term Goal 1 (Week 2): STG=LTG 2/2 ELOS  Skilled Therapeutic Interventions/Progress Updates:    Pt completed supine to sit EOB with supervision and then transferred to the wheelchair with min guard assist using the RW for support.  Therapist then took him down to the ADL apartment where he was educated on tub/shower bench transfers with use of the RW.  He was able to return demonstrate with min assist.  Discussed need for hand held shower as well to make it easier when sitting.  He voiced understanding and will decide if he wants to purchase one here in the hospital or buy one on his own.  Returned to his room via wheelchair at end of session with pt deciding to sit up in the wheelchair with call button and phone in reach and safety alarm belt in place.    Therapy Documentation Precautions:  Precautions Precautions: Fall Restrictions Weight Bearing Restrictions: No  Pain: Pain Assessment Pain Scale: Faces Pain Score: 0-No pain Pain Location: Leg Pain Orientation: Left Pain Descriptors / Indicators: Aching Pain Onset: On-going Pain Intervention(s): Repositioned ADL: See Care Tool Section for some details of ADL  Therapy/Group: Individual Therapy  Cyrene Gharibian OTR/L 08/05/2018, 4:39 PM

## 2018-08-05 NOTE — Discharge Summary (Addendum)
Physician Discharge Summary  Patient ID: Jose Garrett MRN: [id number] DOB/AGE: January 28, 1951 68 y.o.  Admit date: 07/28/2018 Discharge date: 08/13/2018  Discharge Diagnoses:  Active Problems:   Debility   Thrombocytopenia (HCC)   Chronic combined systolic and diastolic congestive heart failure (HCC)   ESRD on dialysis ([facility])   Hemodialysis-associated hypotension   Acute blood loss anemia   Acute on chronic anemia   Drug induced constipation   Obstipation   Coronary artery disease involving native coronary artery of native heart without angina pectoris   Gastroesophageal reflux disease   Leukocytosis   Chronic diastolic congestive heart failure (HCC) Scant hematuria  Discharged Condition: Stable  Significant Diagnostic Studies: Dg Chest Port 1 View  Result Date: 07/23/2018 CLINICAL DATA:  68 year old male with a history of shortness of breath EXAM: PORTABLE CHEST 1 VIEW COMPARISON:  03/23/2018 FINDINGS: Cardiomediastinal silhouette unchanged in size and contour. Mild interlobular septal thickening. No confluent airspace disease. No pleural effusion or pneumothorax. Coarsened interstitial markings. IMPRESSION: Pattern of mild interlobular septal thickening, potentially chronic from prior episodes of CHF versus mild acute edema. No evidence of lobar pneumonia. Electronically Signed   By: Corrie Mckusick D.O.   On: 07/23/2018 12:45   Vas [place] Burnard Bunting With/wo Tbi  Result Date: 07/24/2018 LOWER EXTREMITY DOPPLER STUDY Indications: Peripheral artery disease. High Risk Factors: Hypertension, coronary artery disease. Other Factors: Right AVF                PVD.  Vascular Interventions: 07/22/2018 - Left Femoral to below the knee popliteal                         bypass graft. Limitations: Patient movement Comparison Study: No previous ABI's performed. Performing Technologist: Carlos Levering Rvt  Examination Guidelines: A complete evaluation includes at minimum, Doppler waveform signals and systolic blood  pressure reading at the level of bilateral brachial, anterior tibial, and posterior tibial arteries, when vessel segments are accessible. Bilateral testing is considered an integral part of a complete examination. Photoelectric Plethysmograph (PPG) waveforms and toe systolic pressure readings are included as required and additional duplex testing as needed. Limited examinations for reoccurring indications may be performed as noted.  ABI Findings: +--------+------------------+-----+----------+--------+ Right   Rt Pressure (mmHg)IndexWaveform  Comment  +--------+------------------+-----+----------+--------+ Brachial                                 AVF      +--------+------------------+-----+----------+--------+ PTA     114               1.00 monophasic         +--------+------------------+-----+----------+--------+ DP      254               2.23 biphasic           +--------+------------------+-----+----------+--------+ +---------+------------------+-----+---------+-------+ Left     Lt Pressure (mmHg)IndexWaveform Comment +---------+------------------+-----+---------+-------+ Brachial 114                    triphasic        +---------+------------------+-----+---------+-------+ PTA      254               2.23 biphasic         +---------+------------------+-----+---------+-------+ DP       252               2.21 biphasic         +---------+------------------+-----+---------+-------+  Great Toe50                0.44                  +---------+------------------+-----+---------+-------+ +-------+-----------+-----------+------------+------------+ ABI/TBIToday's ABIToday's TBIPrevious ABIPrevious TBI +-------+-----------+-----------+------------+------------+ Right  1.00                                           +-------+-----------+-----------+------------+------------+ Left   2.21       0.44                                 +-------+-----------+-----------+------------+------------+  Summary: Right: Resting right ankle-brachial index indicates noncompressible right lower extremity arteries. Unable to obtain TBI due to patient movement and low amplitude waveforms. Left: Resting left ankle-brachial index indicates noncompressible left lower extremity arteries.The left toe-brachial index is abnormal.  *See table(s) above for measurements and observations.  Electronically signed by Monica Martinez MD on 07/24/2018 at 4:26:28 PM.    Final    Vas [place] Lower Extremity Saphenous Vein Mapping  Result Date: 07/17/2018 LOWER EXTREMITY VEIN MAPPING Indications:  Pre-op Risk Factors: PAD.  Performing Technologist: Maudry Mayhew MHA, RDMS, RVT, RDCS  Examination Guidelines: A complete evaluation includes B-mode imaging, spectral Doppler, color Doppler, and power Doppler as needed of all accessible portions of each vessel. Bilateral testing is considered an integral part of a complete examination. Limited examinations for reoccurring indications may be performed as noted. +--------------+-------------+--------------------+-------------+--------------+  RT Diameter   RT Findings         GSV          LT Diameter  LT Findings        (cm)                                          (cm)                    +--------------+-------------+--------------------+-------------+--------------+      0.46                     Saphenofemoral       0.40                                                     Junction                                  +--------------+-------------+--------------------+-------------+--------------+      0.38                     Proximal thigh       0.32                    +--------------+-------------+--------------------+-------------+--------------+      0.31                       Mid thigh          0.30  branching     +--------------+-------------+--------------------+-------------+--------------+      0.30                      Distal thigh        0.14       branching    +--------------+-------------+--------------------+-------------+--------------+      0.22                          Knee            0.26                    +--------------+-------------+--------------------+-------------+--------------+      0.16       branching       Prox calf          0.18                    +--------------+-------------+--------------------+-------------+--------------+      0.16                        Mid calf          0.14                    +--------------+-------------+--------------------+-------------+--------------+      0.18       branching      Distal calf         0.19       branching    +--------------+-------------+--------------------+-------------+--------------+                    not            Ankle                     not visualized                visualized                                                  +--------------+-------------+--------------------+-------------+--------------+ +----------------+--------------+---------------+----------------+-----------+ RT diameter (cm) RT Findings        SSV      LT Diameter (cm)LT Findings +----------------+--------------+---------------+----------------+-----------+                 not visualizedPopliteal fossa      0.21                  +----------------+--------------+---------------+----------------+-----------+       0.13                     Proximal calf       0.23                  +----------------+--------------+---------------+----------------+-----------+       0.10                       Mid calf          0.29                  +----------------+--------------+---------------+----------------+-----------+                 not visualized  Distal calf        0.21                   +----------------+--------------+---------------+----------------+-----------+  Summary:  Bilateral: Refer to tables above for vein mapping data.  *See table(s) above for measurements and observations. Diagnosing physician: Servando Snare MD Electronically signed by Servando Snare MD on 07/17/2018 at 4:32:51 PM.    Final    Vas [place] Lower Extremity Venous (dvt)  Result Date: 07/24/2018  Lower Venous Study Indications: Pain, and Swelling. left lower extremity  Performing Technologist: Toma Copier RVS  Examination Guidelines: A complete evaluation includes B-mode imaging, spectral Doppler, color Doppler, and power Doppler as needed of all accessible portions of each vessel. Bilateral testing is considered an integral part of a complete examination. Limited examinations for reoccurring indications may be performed as noted.  +-----+---------------+---------+-----------+----------+-------+ RIGHTCompressibilityPhasicitySpontaneityPropertiesSummary +-----+---------------+---------+-----------+----------+-------+ CFV  Full           Yes      Yes                          +-----+---------------+---------+-----------+----------+-------+ SFJ  Full                                                 +-----+---------------+---------+-----------+----------+-------+   +---------+---------------+---------+-----------+----------+-------+ LEFT     CompressibilityPhasicitySpontaneityPropertiesSummary +---------+---------------+---------+-----------+----------+-------+ CFV      Full           Yes      Yes                          +---------+---------------+---------+-----------+----------+-------+ SFJ      Full                                                 +---------+---------------+---------+-----------+----------+-------+ FV Prox  Full           Yes      Yes                          +---------+---------------+---------+-----------+----------+-------+ FV Mid   Full                                                  +---------+---------------+---------+-----------+----------+-------+ FV DistalFull           Yes      Yes                          +---------+---------------+---------+-----------+----------+-------+ PFV      Full           Yes      Yes                          +---------+---------------+---------+-----------+----------+-------+ POP      Full           Yes      Yes                          +---------+---------------+---------+-----------+----------+-------+ PTV      Full                                                 +---------+---------------+---------+-----------+----------+-------+  PERO     Full                                                 +---------+---------------+---------+-----------+----------+-------+     Summary: Right: There is no evidence of a common femoral vein obstruction Left: There is no evidence of deep vein thrombosis in the lower extremity. No cystic structure found in the popliteal fossa.  *See table(s) above for measurements and observations. Electronically signed by Monica Martinez MD on 07/24/2018 at 4:31:37 PM.    Final     Labs:  Basic Metabolic Panel: Recent Labs  Lab 08/06/18 1416 08/08/18 1344  NA 134* 132*  K 4.1 4.2  CL 94* 93*  CO2 23 25  GLUCOSE 110* 119*  BUN 54* 50*  CREATININE 8.79* 8.20*  CALCIUM 9.1 9.4  PHOS 5.2* 4.7*    CBC: Recent Labs  Lab 08/07/18 0608 08/08/18 1344  WBC 5.5 5.2  NEUTROABS 3.7  --   HGB 10.8* 10.3*  HCT 32.6* 31.8*  MCV 97.3 98.1  PLT 121* 114*    CBG: No results for input(s): GLUCAP in the last 168 hours.  Family history.  Mother with history of hypertension diabetes CAD and skin cancer.  Father with hypertension diabetes sister with diabetes.  Brief HPI:    Aurthur P. Trenton Gammon is a 68 year old right-handed male with history of remote tobacco abuse end-stage renal disease due to cardiorenal syndrome in the setting of AL amyloidosis, aortic stenosis  hypertension CHF followed by Dr. Angelena Form.  Per chart review lives alone and uses a cane for ambulation.  Daughter plans to assist on discharge.  Presented 07/22/2018 with progressive discoloration and ischemia in his toes on the left side.  Underwent noninvasive studies which detected noncompressible vessels with no flow detected in the left toes and dampened flow in the right toes.  Underwent outpatient PV angiogram showing occlusion of bilateral SFAs but no gangrenous changes on the left side.  He was felt to be a good candidate for surgical intervention and underwent left common femoral to below-knee popliteal artery bypass 07/22/2018 per Dr. Servando Snare.  Renal service follow-up hemodialysis ongoing.  Hospital course hypotension.  EKG showed ST with right bundle branch block suspect non-STEMI.  Cardiology services consulted troponin 0 0.09, hemoglobin 6.8-7.3, potassium 5.9.  He was transfused 1 unit packed red blood cells.  Underwent cardiac catheterization 07/24/2018 showed chronic total occlusion of RCA with left to right collaterals.  Echocardiogram with ejection fraction of 40% moderately increased left ventricular wall thickness with reduced systolic function.  Initially required IV heparin transition to aspirin and Plavix.  Subcutaneous heparin for DVT prophylaxis.  Patient was admitted for a comprehensive rehab program  Hospital Course: DAMARCUS REGGIO was admitted to rehab 07/28/2018 for inpatient therapies to consist of PT, ST and OT at least three hours five days a week. Past admission physiatrist, therapy team and rehab RN have worked together to provide customized collaborative inpatient rehab.  Pertaining to patient's peripheral vascular disease with left common femoral to below-knee popliteal artery bypass 07/22/2018.  He would follow-up with vascular surgery.  Close monitoring for any increased swelling.  Pain management use of Neurontin 300 mg daily daily as well as oxycodone as needed.  Dopplers  and elevation per cardiovascular.  Subcutaneous heparin for DVT prophylaxis no bleeding episodes.  Patient remained on aspirin  and Plavix therapy.  Noted history of aortic stenosis followed by cardiology services Hospital course was complicated by non-STEMI with recommendations to continue aspirin and Plavix no chest pain or increasing shortness of breath.  Hemoglobin hematocrit remained stable.  Bouts of hypotension with noted history of aortic stenosis patient asymptomatic and continued on ProAmatine 10 mg Monday Wednesday Friday.  He continued on hemodialysis as directed with latest creatinine 8.20.  Lipitor ongoing for hyperlipidemia.  He exhibited no other signs of fluid overload with noted history of chronic combined diastolic systolic congestive heart failure and again would follow-up with cardiology services.  Bouts of constipation resolved with laxative assistance.  Patient with one episode 08/12/2018 of scant hematuria.  Patient makes very little urine due to end-stage renal disease with hemodialysis.  He remained afebrile white blood cell count 6100 he denied any dysuria.  Recommendations were for outpatient urology follow-up  Physical exam.  Blood pressure 93/60 pulse 87 temperature 98.4 respirations 18 oxygen saturations 100% room air Constitutional.  No distress HEENT Head.  Normocephalic and atraumatic Eyes.  Pupils round and reactive to light without nystagmus or discharge Neck.  Supple nontender no thyromegaly no JVD Cardiovascular normal rate and rhythm exam reveals no friction rub or murmur heard Respiratory.  Effort normal no respiratory distress without wheezes GI.  Soft no distention nontender without rebound Musculoskeletal left lower extremity 2+ edema right lower extremity trace to 1+ Neurological.  Alert and oriented follows commands Skin.  Warm femoral-popliteal bypass site clean and dry left foot dusky necrotic tissue on plantar aspect of toes 3-5, areas of ischemia along toes  on the right.  Right lower extremity with areas of ischemia in the foot to a lesser extent.  Rehab course: During patient's stay in rehab weekly team conferences were held to monitor patient's progress, set goals and discuss barriers to discharge. At admission, patient required minimal assist to ambulate 60 feet rolling walker, minimal assist sit to stand, moderate assist supine to sit.  Minimal assist upper body bathing total assist lower body bathing minimal assist upper body dressing total assist lower body dressing  He  has had improvement in activity tolerance, balance, postural control as well as ability to compensate for deficits. He has had improvement in functional use RUE/LUE  and RLE/LLE as well as improvement in awareness.  Working with energy conservation techniques.  Ambulating 150 feet rolling walker minimal guard rest breaks as needed.  Up and down stairs minimal assistance.  Stand pivot transfers wheelchair to edge of bed with minimal guard assist rolling walker.  Practice bed mobility in preparation for discharge to his daughter's home set up bed to simulate home environment and what side of the bed he would be on.  Perform supine to sit without physical assistance.  Self-care shower level ambulates to the bathroom rolling walker minimal assist for sit to stand and contact-guard for ambulation.  Discussed stepping over threshold backwards to get into the shower.  Able to bathe all parts except for feet.  Patient initially scheduled for discharge 08/06/2018 however discharge extended to 08/13/2018 to enable patient to sustain a better physical independence.  Full family teaching was completed plan discharge to home       Disposition: Discharged to home There are no questions and answers to display.         Diet: Renal diet  Special Instructions: No smoking driving or alcohol Continue hemodialysis as directed  Outpatient referral urology services for hematuria  Medications on  discharge. 1.  Tylenol as needed 2.  Aspirin 81 mg p.o. daily 3.  Rocaltrol 1.25 mcg Monday Wednesday Friday hemodialysis 4.  Sensipar 60 mg Monday Wednesday Friday 5.  Plavix 75 mg p.o. daily 6.  Colace 100 mg p.o. 3 times daily 7.  Neurontin 300 mg every morning 8.  Oxycodone 5 to 10 mg every 6 hours as needed pain 9.  Protonix 40 mg p.o. daily 10.  MiraLAX twice daily hold for loose stools 11.velphoro 1000 mg p.o. 3 times daily 12.  Vitamin B12 1000 mcg p.o. daily 13.  Lipitor 80 mg daily 14.  ProAmatine 10 mg every Monday Wednesday Friday 15.  Multivitamin 1 daily at bedtime  Discharge Instructions    Ambulatory referral to Physical Medicine Rehab   Complete by:  As directed    Follow-up 1 month debilitation related to peripheral vascular disease with left femoral-popliteal bypass      Follow-up Information    Jamse Arn, MD Follow up.   Specialty:  Physical Medicine and Rehabilitation Why:  Office to call for appointment Contact information: [place] [address] [phone]        Burnell Blanks, MD Follow up.   Specialty:  Cardiology Why:  Call for appointment Contact information: [place] [place] [address] [phone]        Waynetta Sandy, MD Follow up.   Specialties:  Vascular Surgery, Cardiology Why:  Call for appointment Contact information: [place] [address] [phone]        [name], MD Follow up.   Specialty:  Nephrology Why:  Call for appointment Contact information: [place] [address] [phone]           Signed: Cathlyn Parsons 08/12/2018, 8:59 AM Patient seen and examined by me on day of discharge. > 30 minutes spent in total between myself and PA regarding discharge of aforementioned as well as complaint of bloody uretal discharge. This was noted to be a single occurrence and patient had been cathed  previously.  Labs improved. Unable to confirm with nursing regarding discharge. Outpatient Urology follow up scheduled. Delice Lesch, MD, ABPMR

## 2018-08-05 NOTE — Progress Notes (Signed)
Occupational Therapy Weekly Progress Note  Patient Details  Name: Jose Garrett MRN: [id number] Date of Birth: October 11, 1950  Beginning of progress report period: Jul 29, 2018 End of progress report period: Aug 05, 2018  Today's Date: 08/05/2018 OT Individual Time: 9833-8250 OT Individual Time Calculation (min): 41 min    Patient has met 3 of 3 short term goals.  Pt has had a few set-backs this week, refusing therapy a few times 2/2 "not feeling well." Pt much better today and completed bathing/dressing tasks at an overall min A level. Pt continues to be limited by pain and fatigue, but did better today working through that. Continue current POC.   Patient continues to demonstrate the following deficits: muscle weakness, decreased cardiorespiratoy endurance and decreased standing balance and decreased balance strategies and therefore will continue to benefit from skilled OT intervention to enhance overall performance with BADL.  Patient progressing toward long term goals..  Continue plan of care.  OT Short Term Goals Week 1:  OT Short Term Goal 1 (Week 1): Pt will complete toilet transfer with min A and LRAD OT Short Term Goal 1 - Progress (Week 1): Met OT Short Term Goal 2 (Week 1): Pt will complete 1 step of LB dressing task OT Short Term Goal 2 - Progress (Week 1): Met OT Short Term Goal 3 (Week 1): Pt will tolerate standing for 2 minutes in preparation for BADL tasks OT Short Term Goal 3 - Progress (Week 1): Met Week 2:  OT Short Term Goal 1 (Week 2): STG=LTG 2/2 ELOS  Skilled Therapeutic Interventions/Progress Updates:    Pt greeted seated EOB, pt seems to be feeling much better today and agreeable to OT treatment session. Treatment focused on self-care retraining at shower level. Sit<>stand from EOB with verbal cues for hand placement and min A. Pt ambulated into bathroom to transfer into shower with increased time, RW and min A. Bathing completed with min A to access LEs. Educated on  use of long handled sponge to increase independence. Stand-pivot out of shower with CGA and use of grab bars. OT reviewed importance of drying feet, then assisted with donning TED hose for edema management. Min A for LB dressing to thread LLE into pants. Stand-pivot back to bed with CGA.  Pt with noted wound between second and third toes, with toes black underneath foot. Nursing notified and deferred donning TED hose right now in order for nursing to assess LE. Bed alarm on and needs met.   Therapy Documentation Precautions:  Precautions Precautions: Fall Restrictions Weight Bearing Restrictions: No Pain: Pain Assessment Pain Scale: 0-10 Pain Score: 2  Pain Location: Leg Pain Orientation: Left Pain Descriptors / Indicators: Aching Pain Onset: On-going Pain Intervention(s): Repositioned   Therapy/Group: Individual Therapy  Valma Cava 08/05/2018, 12:42 PM

## 2018-08-05 NOTE — Progress Notes (Addendum)
[facility] Progress Note   Subjective: feels better today, working w OT.  2.5 L off yest w/ HD and BP's were good > 95 , low 100's as well.   Objective Vitals:   08/04/18 1800 08/04/18 1809 08/04/18 2043 08/05/18 0546  BP: 108/60 (!) 111/57 (!) 90/59 (!) 92/47  Pulse: 86 88 85 83  Resp:  16 18 18   Temp:  98.6 F (37 C) 98.8 F (37.1 C) 98.1 F (36.7 C)  TempSrc:  Oral Oral Oral  SpO2:  96% 94% 99%  Weight:  86.5 kg  86.9 kg  Height:       Physical Exam General: Elderly man, NAD Heart: RRR; 2/6 murmur Lungs: CTA anteriorly Abdomen: soft, non-tender Extremities: No RLE edema, 2+ LLE edema Dialysis Access:  AVF + thrill   home meds:   - aspirin 81/ clopidogrel 75 hs/ atorvastatin 80   - gabapentin 300mg  am+ 900mg  hs/ oxycodone IR prn/ oxycontin 10 bid   - sucroferric oxyhydroxide 2-3 tabs ac tid   - prn's/ vitamins/ supplements    [facility] MWF 4h 400/800 86kg 2/2 P4R AVF Hep 8000 Sensipar 60 TIW Calcitriol 1.25 TIW Mircera 50 q 2 weeks (last 4/29)  Assessment/Plan: 1. ESRD: on HD MWF. HD today. Vol excess w/ LLE edema, tolerated 2.5 L off yest.  Will try for 3 L off max tomorrow, still has sig vol overload mostly in L (surgical) leg. Lower edw as tolerated.   2.  Hyperkalemia: use 1K bath during early HD. Better.   3. PAD with LLE critical limb ischemia: S/pL fem-pop BPG. Post-op LLE edema.  4. Ischemic cardiomyopathy / severe AS / EF 35-40% - chronic hypotension related to these issues. stable  5. Anemia (d/t ESRD and ABLA): On aranesp and nulecit - increased both doses. SP 200 ug darbe on 5/20 here, next dose due tomorrow 5/27. Transfused 1 unit PRBC'son 5/13, 5/17, 5/20 and 2u on 5/22 (total 5u).  Hb now 10-11 range. Will get stool guiacs, anemia panel/ fe studies. MCV high.   6. Secondary hyperparathyroidism: Continue home velphoro, Sensipar, calcitriol   7. Elevated troponin: S/p cath with severe single vessel CAD chronic  total chronic occlusion of RCA. Medical therapy recommended. No targets for PCI.   [name]  MD 08/01/2018, 1:21 PM  Additional Objective Labs: Basic Metabolic Panel: Recent Labs  Lab 07/30/18 1341 08/01/18 1300 08/04/18 1513  NA 134* 134* 135  K 5.3* 4.0 4.6  CL 95* 93* 96*  CO2 23 24 21*  GLUCOSE 121* 100* 118*  BUN 64* 55* 71*  CREATININE 8.62* 8.33* 10.20*  CALCIUM 9.0 9.1 8.9  PHOS 7.1* 5.6* 4.9*   Liver Function Tests: Recent Labs  Lab 07/30/18 1341 08/01/18 1300 08/04/18 1513  ALBUMIN 2.8* 2.8* 2.9*   CBC: Recent Labs  Lab 07/30/18 1342 08/01/18 1305 08/02/18 1419 08/04/18 1110 08/04/18 1512  WBC 7.9 5.6  --  11.7* 11.6*  NEUTROABS  --   --   --  10.3*  --   HGB 6.9* 6.3* 11.4* 11.6* 10.8*  HCT 21.3* 19.7* 34.3* 35.4* 33.4*  MCV 103.9* 102.6*  --  98.6 99.4  PLT 120* 116*  --  121* 128*   Medications: . ferric gluconate (FERRLECIT/NULECIT) IV 125 mg (07/30/18 1714)   . sodium chloride   Intravenous Once  . sodium chloride   Intravenous Once  . aspirin EC  81 mg Oral Daily  . atorvastatin  80 mg Oral Q supper  . calcitRIOL  1.25 mcg Oral Q M,W,F-HD  . Chlorhexidine Gluconate Cloth  6 each Topical Q0600  . Chlorhexidine Gluconate Cloth  6 each Topical Q0600  . cinacalcet  60 mg Oral Q M,W,F-HD  . clopidogrel  75 mg Oral Q supper  . darbepoetin (ARANESP) injection - DIALYSIS  200 mcg Intravenous Q Wed-HD  . docusate sodium  100 mg Oral TID  . gabapentin  300 mg Oral q morning - 10a  . heparin  5,000 Units Subcutaneous Q8H  . loratadine  10 mg Oral Daily  . pantoprazole  40 mg Oral Daily  . polyethylene glycol  17 g Oral BID  . sucroferric oxyhydroxide  1,000 mg Oral TID WC  . vitamin B-12  1,000 mcg Oral Daily

## 2018-08-05 NOTE — Progress Notes (Signed)
Physical Therapy Session Note  Patient Details  Name: Jose Garrett MRN: [id number] Date of Birth: 02/04/1951  Today's Date: 08/05/2018 PT Individual Time: 0900-0959 PT Individual Time Calculation (min): 59 min   Short Term Goals: Week 1:  PT Short Term Goal 1 (Week 1): Pt will participate in 60 min of upright activity w/o increase in fatigue PT Short Term Goal 1 - Progress (Week 1): Not met PT Short Term Goal 2 (Week 1): Pt will perform transfers w/ CGA PT Short Term Goal 2 - Progress (Week 1): Progressing toward goal PT Short Term Goal 3 (Week 1): Pt will ambulate 5' w/ supervision using LRAD PT Short Term Goal 3 - Progress (Week 1): Not met  Skilled Therapeutic Interventions/Progress Updates:    Patient received supine in bed and willing to participate with PT. Patient requiring total A to don TED stocking of L LE - noted darkened areas of skin on plantar surface of toes as well as peeling skin - nursing notified and okayed to continue PT treatment. Supervision for bed level mobility to come to EOB. Min guard/Min Assist for sit to stand/stand pivot at bedside with RW to manual w/c - noted posterior lean with sit to stands with cueing for posturing, balance and to reduce pulling up from RW. Patient able to self propel in w/c form room to PT gym (~250 feet) with supervision for safety and obstacle navigation.   Stand pivot to NuStep with Min Assist. NuStep L3 x 6 min with B UE and LE for global strengthening with good tolerance. Focusing on improving LE endurance, strengthening, and stretching. Ambulation x 2 trials with RW with Min guard assist for safety - 37 feet, then 40 feet with required seated rest break due to fatigue and subjective reports of tightness at B calf with pain rating 7/10. Patient demonstrating step to pattern with ability to demonstrate even step length bilaterally with improved foot clearance.   PT assisting with returning patient to room via w/c with patient left in  chair, call bell in reach and all needs met.   Therapy Documentation Precautions:  Precautions Precautions: Fall Restrictions Weight Bearing Restrictions: No Vital Signs: Therapy Vitals Temp: 98.1 F (36.7 C) Temp Source: Oral Pulse Rate: 83 Resp: 18 BP: (!) 92/47 Patient Position (if appropriate): Sitting Oxygen Therapy SpO2: 99 % O2 Device: Room Air Pain: Pain Assessment Pain Scale: 0-10 Pain Score: 6  Pain Location: Leg(B LE) Pain Orientation: Right;Left Pain Descriptors / Indicators: Aching;Discomfort;Grimacing;Guarding  Therapy/Group: Individual Therapy  Lanney Gins, PT, DPT Supplemental Physical Therapist 08/05/18 10:58 AM Pager: [phone] Office: [phone]   Burnard Bunting, PT, DPT 08/05/2018, 9:28 AM

## 2018-08-05 NOTE — Plan of Care (Signed)
Downgraded to overall supervision Problem: RH Balance Goal: LTG Patient will maintain dynamic standing with ADLs (OT) Description LTG:  Patient will maintain dynamic standing balance with assist during activities of daily living (OT)  Flowsheets (Taken 08/05/2018 1446) LTG: Pt will maintain dynamic standing balance during ADLs with: Supervision/Verbal cueing Note:  Goal downgraded 5/26-ESD   Problem: Sit to Stand Goal: LTG:  Patient will perform sit to stand in prep for activites of daily living with assistance level (OT) Description LTG:  Patient will perform sit to stand in prep for activites of daily living with assistance level (OT) Flowsheets (Taken 08/05/2018 1446) LTG: PT will perform sit to stand in prep for activites of daily living with assistance level: Supervision/Verbal cueing Note:  Goal downgraded 5/26-ESD   Problem: RH Dressing Goal: LTG Patient will perform lower body dressing w/assist (OT) Description LTG: Patient will perform lower body dressing with assist, with/without cues in positioning using equipment (OT) Flowsheets (Taken 08/05/2018 1446) LTG: Pt will perform lower body dressing with assistance level of: Supervision/Verbal cueing Note:  Goal downgraded 5/26-ESD   Problem: RH Toileting Goal: LTG Patient will perform toileting task (3/3 steps) with assistance level (OT) Description LTG: Patient will perform toileting task (3/3 steps) with assistance level (OT)  Flowsheets (Taken 08/05/2018 1446) LTG: Pt will perform toileting task (3/3 steps) with assistance level : Supervision/Verbal cueing Note:  Goal downgraded 5/26-ESD   Problem: RH Toilet Transfers Goal: LTG Patient will perform toilet transfers w/assist (OT) Description LTG: Patient will perform toilet transfers with assist, with/without cues using equipment (OT) Flowsheets (Taken 08/05/2018 1446) LTG: Pt will perform toilet transfers with assistance level of: Supervision/Verbal cueing Note:  Goal  downgraded 5/26-ESD

## 2018-08-05 NOTE — Progress Notes (Signed)
Occupational Therapy Session Note  Patient Details  Name: RASHI GIULIANI MRN: [id number] Date of Birth: 03-Mar-1951  Today's Date: 08/05/2018 OT Individual Time: 1030-1130 OT Individual Time Calculation (min): 60 min    Short Term Goals: Week 1:  OT Short Term Goal 1 (Week 1): Pt will complete toilet transfer with min A and LRAD OT Short Term Goal 2 (Week 1): Pt will complete 1 step of LB dressing task OT Short Term Goal 3 (Week 1): Pt will tolerate standing for 2 minutes in preparation for BADL tasks  Skilled Therapeutic Interventions/Progress Updates:    Patient in wheelchair upon therapy arrival and agreeable to participate in OT session. Pt requested to complete BUE strengthening in his room to increase functional performance during ADL tasks and sit to stands. Utilized orange theraband to complete BUE shoulder and elbow strengthening; 10 repetitions; 1 set each. Rest breaks taken as needed due to fatigue. Patient was left in room with seat belt alarm on and call light within reach at end of session.   Therapy Documentation Precautions:  Precautions Precautions: Fall Restrictions Weight Bearing Restrictions: No Pain: Pain Assessment Pain Scale: 0-10 Pain Score: 0-No pain   Therapy/Group: Individual Therapy  Ailene Ravel, OTR/L,CBIS  [phone]  08/05/2018, 11:27 AM

## 2018-08-05 NOTE — Progress Notes (Signed)
Physical Therapy Weekly Progress Note  Patient Details  Name: Jose Garrett MRN: [id number] Date of Birth: 09/21/1950  Beginning of progress report period: Jul 29, 2018 End of progress report period: Aug 05, 2018  Patient has met 0 of 3 short term goals. Pt has made minimal progress towards LTGs due to poor participation, fatigue, and pain. He continues to require min assist for all upright/OOB mobility and encouragement to participate meaningfully throughout sessions.    Patient continues to demonstrate the following deficits muscle weakness and muscle joint tightness, decreased cardiorespiratoy endurance and decreased standing balance, decreased postural control and decreased balance strategies and therefore will continue to benefit from skilled PT intervention to increase functional independence with mobility.  Patient not progressing toward long term goals.  See goal revision..  Plan of care revisions: LTGs downgraded to supervision overall 2/2 lack of progress. Recommending length of stay be increased 2/2 limited progress over last week.   PT Short Term Goals Week 1:  PT Short Term Goal 1 (Week 1): Pt will participate in 60 min of upright activity w/o increase in fatigue PT Short Term Goal 1 - Progress (Week 1): Not met PT Short Term Goal 2 (Week 1): Pt will perform transfers w/ CGA PT Short Term Goal 2 - Progress (Week 1): Progressing toward goal PT Short Term Goal 3 (Week 1): Pt will ambulate 77' w/ supervision using LRAD PT Short Term Goal 3 - Progress (Week 1): Not met Week 2:  PT Short Term Goal 1 (Week 2): =LTGs due to ELOS  Jose Garrett 08/05/2018, 7:46 AM

## 2018-08-06 ENCOUNTER — Inpatient Hospital Stay (HOSPITAL_COMMUNITY): Payer: Medicare Other

## 2018-08-06 ENCOUNTER — Inpatient Hospital Stay (HOSPITAL_COMMUNITY): Payer: Medicare Other | Admitting: Physical Therapy

## 2018-08-06 DIAGNOSIS — D72829 Elevated white blood cell count, unspecified: Secondary | ICD-10-CM

## 2018-08-06 DIAGNOSIS — I5032 Chronic diastolic (congestive) heart failure: Secondary | ICD-10-CM

## 2018-08-06 LAB — FERRITIN: Ferritin: 974 ng/mL — ABNORMAL HIGH (ref 24–336)

## 2018-08-06 LAB — VITAMIN B12: Vitamin B-12: 759 pg/mL (ref 180–914)

## 2018-08-06 LAB — OCCULT BLOOD X 1 CARD TO LAB, STOOL: Fecal Occult Bld: NEGATIVE

## 2018-08-06 LAB — RENAL FUNCTION PANEL
Albumin: 2.8 g/dL — ABNORMAL LOW (ref 3.5–5.0)
Anion gap: 17 — ABNORMAL HIGH (ref 5–15)
BUN: 54 mg/dL — ABNORMAL HIGH (ref 8–23)
CO2: 23 mmol/L (ref 22–32)
Calcium: 9.1 mg/dL (ref 8.9–10.3)
Chloride: 94 mmol/L — ABNORMAL LOW (ref 98–111)
Creatinine, Ser: 8.79 mg/dL — ABNORMAL HIGH (ref 0.61–1.24)
GFR calc Af Amer: 7 mL/min — ABNORMAL LOW (ref >60)
GFR calc non Af Amer: 6 mL/min — ABNORMAL LOW (ref >60)
Glucose, Bld: 110 mg/dL — ABNORMAL HIGH (ref 70–99)
Phosphorus: 5.2 mg/dL — ABNORMAL HIGH (ref 2.5–4.6)
Potassium: 4.1 mmol/L (ref 3.5–5.1)
Sodium: 134 mmol/L — ABNORMAL LOW (ref 135–145)

## 2018-08-06 LAB — IRON AND TIBC
Iron: 44 ug/dL — ABNORMAL LOW (ref 45–182)
Saturation Ratios: 23 % (ref 17.9–39.5)
TIBC: 190 ug/dL — ABNORMAL LOW (ref 250–450)
UIBC: 146 ug/dL

## 2018-08-06 LAB — RETICULOCYTES
Immature Retic Fract: 13.6 % (ref 2.3–15.9)
RBC.: 3.07 MIL/uL — ABNORMAL LOW (ref 4.22–5.81)
Retic Count, Absolute: 74.9 10*3/uL (ref 19.0–186.0)
Retic Ct Pct: 2.4 % (ref 0.4–3.1)

## 2018-08-06 LAB — FOLATE: Folate: 9.4 ng/mL (ref >5.9)

## 2018-08-06 MED ORDER — DARBEPOETIN ALFA 200 MCG/0.4ML IJ SOSY
PREFILLED_SYRINGE | INTRAMUSCULAR | Status: AC
  Administered 2018-08-06: 200 ug via INTRAVENOUS
  Filled 2018-08-06: qty 0.4

## 2018-08-06 MED ORDER — CALCITRIOL 0.25 MCG PO CAPS
ORAL_CAPSULE | ORAL | Status: AC
  Administered 2018-08-06: 19:00:00
  Filled 2018-08-06: qty 1

## 2018-08-06 MED ORDER — HEPARIN SODIUM (PORCINE) 1000 UNIT/ML DIALYSIS
8000.0000 [IU] | Freq: Once | INTRAMUSCULAR | Status: DC
  Filled 2018-08-06: qty 8

## 2018-08-06 MED ORDER — CALCITRIOL 0.5 MCG PO CAPS
ORAL_CAPSULE | ORAL | Status: AC
  Administered 2018-08-06: 19:00:00
  Filled 2018-08-06: qty 2

## 2018-08-06 NOTE — Progress Notes (Signed)
This nurse tried to administer the 2200 dose of heparin. The pt refused and stated he did not want it at this time. This nurse explained the risk and benefits of refusing the medication. Pt continued to refuse.

## 2018-08-06 NOTE — Progress Notes (Signed)
[facility] PROGRESS NOTE  Subjective/Complaints: Patient seen sitting up at the edge of his bed this morning.  He states he slept well overnight.  He is working with therapies.  He states he feels better.  He was seen by nephrology yesterday, notes reviewed.  ROS: Denies CP, shortness of breath, nausea, vomiting, diarrhea.  Objective: Vital Signs: Blood pressure (!) 98/57, pulse 80, temperature 97.9 F (36.6 C), resp. rate 17, height 5\' 10"  (1.778 m), weight 86.1 kg, SpO2 94 %. No results found. Recent Labs    08/04/18 1110 08/04/18 1512  WBC 11.7* 11.6*  HGB 11.6* 10.8*  HCT 35.4* 33.4*  PLT 121* 128*   Recent Labs    08/04/18 1513  NA 135  K 4.6  CL 96*  CO2 21*  GLUCOSE 118*  BUN 71*  CREATININE 10.20*  CALCIUM 8.9    Physical Exam: BP (!) 98/57 (BP Location: Left Arm)   Pulse 80   Temp 97.9 F (36.6 C)   Resp 17   Ht 5\' 10"  (1.778 m)   Wt 86.1 kg   SpO2 94%   BMI 27.24 kg/m  Constitutional: No distress . Vital signs reviewed. HENT: Normocephalic.  Atraumatic. Eyes: EOMI.  No discharge. Cardiovascular: No JVD. Respiratory: Normal effort. GI: Non-distended. Musculoskeletal: Left foot >right lower extremity edema, unchanged Neurological: Alert and oriented Follows commands.  Motor: Right lower extremity: 4/5 proximal distal, unchanged Left lower extremity: Hip flexion, knee extension 2-2+/5, ankle dorsiflexion 4-/5, unchanged Skin: Skin iswarm. He isnot diaphoretic. Femoropopliteal bypass site C/D/I  Circular appearing erythema at distal end of bypass site Psychiatric: He has a normal mood and affect. Hisbehavior is normal.Judgmentand thought contentnormal.   Assessment/Plan: 1. Functional deficits secondary to debility which require 3+ hours per day of interdisciplinary therapy in a comprehensive inpatient rehab setting.  Physiatrist is providing close team supervision and 24 hour management of active  medical problems listed below.  Physiatrist and rehab team continue to assess barriers to discharge/monitor patient progress toward functional and medical goals  Care Tool:  Bathing    Body parts bathed by patient: Right arm, Left arm, Chest, Abdomen, Front perineal area, Left upper leg, Right upper leg, Face, Buttocks   Body parts bathed by helper: Right lower leg, Left lower leg Body parts n/a: Left lower leg   Bathing assist Assist Level: Contact Guard/Touching assist     Upper Body Dressing/Undressing Upper body dressing   What is the patient wearing?: Pull over shirt    Upper body assist Assist Level: Independent    Lower Body Dressing/Undressing Lower body dressing      What is the patient wearing?: Underwear/pull up, Pants     Lower body assist Assist for lower body dressing: Contact Guard/Touching assist     Toileting Toileting    Toileting assist Assist for toileting: Minimal Assistance - Patient > 75%     Transfers Chair/bed transfer  Transfers assist     Chair/bed transfer assist level: Contact Guard/Touching assist     Locomotion Ambulation   Ambulation assist   Ambulation activity did not occur: Safety/medical concerns  Assist level: Minimal Assistance - Patient > 75% Assistive device: Walker-rolling Max distance: 40   Walk 10 feet activity   Assist  Walk 10 feet activity did not occur: Safety/medical concerns  Assist level: Minimal Assistance - Patient > 75% Assistive device: Walker-rolling   Walk 50 feet activity   Assist Walk 50 feet with 2 turns activity did not occur:  Safety/medical concerns         Walk 150 feet activity   Assist Walk 150 feet activity did not occur: Safety/medical concerns         Walk 10 feet on uneven surface  activity   Assist Walk 10 feet on uneven surfaces activity did not occur: Safety/medical concerns         Wheelchair     Assist Will patient use wheelchair at discharge?:  Yes Type of Wheelchair: Manual    Wheelchair assist level: Supervision/Verbal cueing Max wheelchair distance: 150'    Wheelchair 50 feet with 2 turns activity    Assist        Assist Level: Supervision/Verbal cueing   Wheelchair 150 feet activity     Assist     Assist Level: Supervision/Verbal cueing      Medical Problem List and Plan: 1.Debilitysecondary to PAD status post left common femoral to below-knee popliteal artery bypass 2/91/9166 complicated by non-STEMI  Continue CIR  Team conference today to discuss current and goals and coordination of care, home and environmental barriers, and discharge planning with nursing, case manager, and therapies.  2. Antithrombotics: -DVT/anticoagulation:Subcutaneous heparin -antiplatelet therapy: Aspirin 81 mg daily, Plavix 75 mg daily 3. Pain Management:Neurontin 300 mg every evening, oxycodone as needed 4. Mood:Provide emotional support -antipsychotic agents: N/A 5. Neuropsych: This patientiscapable of making decisions on hisown behalf. 6. Skin/Wound Care:Routine skin checks. Local skin care, protection to necrotic areas along toes of left foot.  Left toes are warm right toes are cool and dusky but not painful  Will need to monitor closely for left lower extremity.  Monitor circular lesion as well   Daily Dopplers and elevation per cardiovascular 7. Fluids/Electrolytes/Nutrition: Routine in and outs 8. Acute blood loss anemia. Continue Aranesp   Hemoglobin 10.8 on 5/25, labs ordered for tomorrow  Continue to monitor 9.Hypotension/aortic stenosis.   Blood pressures low, but asymptomatic on 5/27 10. End-stage renal disease. Continue hemodialysis as directed.  Recs per nephro  11. Hyperlipidemia. Lipitor 12. Chronic combined diastolic systolic congestive heart failure with EF of 35-40% and severe left ear. Monitor for any signs of fluid overload  Daily weights ordered Filed  Weights   08/04/18 1809 08/05/18 0546 08/06/18 0516  Weight: 86.5 kg 86.9 kg 86.1 kg   Stable on 5/27 13.Constipation. Laxative assistance 14.  Thrombocytopenia  Platelets 128 on 5/25, labs ordered for tomorrow  Continue to monitor 15.  Severe constipation  Bowel meds increased on 5/21  Will consider further increase in medications if necessary 16.  Severe CAD  Single-vessel disease total occlusion of RCA  Medical management 17.  Leukocytosis  WBCs 11.6 on 5/25, labs ordered for tomorrow  Afebrile  Continue to monitor 18. GERD  Cont Protonix  TUMS prn started on 5/26  LOS: 9 days A FACE TO FACE EVALUATION WAS PERFORMED  Dandria Griego Lorie Phenix 08/06/2018, 8:55 AM

## 2018-08-06 NOTE — Progress Notes (Signed)
Physical Therapy Session Note  Patient Details  Name: Jose Garrett MRN: [id number] Date of Birth: 19-Nov-1950  Today's Date: 08/06/2018 PT Individual Time: 0900-1000 AND 1105-1150 PT Individual Time Calculation (min): 60 min AND 45 min  Short Term Goals: Week 2:  PT Short Term Goal 1 (Week 2): =LTGs due to ELOS  Skilled Therapeutic Interventions/Progress Updates:   Session 1:  Pt in recliner and agreeable to therapy, pain 6/10 in LLE. LLE remains limited by swelling. Wrapped LE w/ ACE wrap over TED hose, ongoing education and discussion on swelling management techniques. Ambulated into hallway w/ RW, 27' w/ CGA and w/c follow. Pt w/ improved step-though pattern, L weight shifting, and more functional gait speed this session. Total assist w/c transport remainder of way. CGA stand pivot to NuStep. NuStep @ level 3, 5 min + 3 min, to work on LLE ROM/stretching/strengthening as well as activiating muscle pump for fluid return. Pt self-propelled w/c back to room w/ BUEs to work on endurance. Ended session in supine, LLE propped above heart level and all needs in reach. Ice applied to lower leg and groin for swelling/pain.   Session 2:  Pt in supine agreeable to therapy w/ encouragement, did not know he had another therapy session today and wanting to stay in bed 2/2 pain/fatigue. Pain 6/10 in LLE but he does state that ice helped. Supine LLE ROM/strengthening exercises as listed below. Intermittent rests 2/2 discomfort/stiffness from stretching/ROM, no increase in pain. Discussed exercises pt can perform in bed outside of therapy including ankle pumps, quad sets, and glut sets. Tapped visual reminder to end of bed at feet. Therapist careful to limit pressure on L toes during handling 2/2 ischemic changes already present. Educated on importance of stretching, even in flat supine position. Ended session in supine, LLE elevated above heart level and ice applied in same spots as before. Instructed to remove  ice in 20-30 minutes. Pt verbalized understanding and in agreement. Missed 15 min of skilled PT this session 2/2 pain/fatigue.   LLE exercises: -ankle pumps 3x20 -quad set 3x10 -glut set 3x10 -abduction slides, active assisted 3x10 -supine abduction stretch within tolerable range, 30 sec x3 -supine knee to chest stretch within tolerable range, 30 sec x3 -gastroc stretch 30 sec x5 -supine hip flexor/hamstring stretch in flat positioning x5 minutes  Therapy Documentation Precautions:  Precautions Precautions: Fall Restrictions Weight Bearing Restrictions: No  Therapy/Group: Individual Therapy  Cable Fearn K Nickalos Petersen 08/06/2018, 10:01 AM

## 2018-08-06 NOTE — Progress Notes (Signed)
[facility] Progress Note   Subjective: feels good today, in good spirits.    Objective Vitals:   08/06/18 1316 08/06/18 1330 08/06/18 1400 08/06/18 1530  BP: 110/65 (!) 107/59 104/63 106/60  Pulse: 89 85 88 85  Resp:      Temp:      TempSrc:      SpO2:      Weight:      Height:       Physical Exam General: Elderly man, NAD Heart: RRR; 2/6 murmur Lungs: CTA anteriorly Abdomen: soft, non-tender Extremities: No RLE edema, 2+ LLE edema Dialysis Access:  AVF + thrill   home meds:   - aspirin 81/ clopidogrel 75 hs/ atorvastatin 80   - gabapentin 300mg  am+ 900mg  hs/ oxycodone IR prn/ oxycontin 10 bid   - sucroferric oxyhydroxide 2-3 tabs ac tid   - prn's/ vitamins/ supplements    [facility] MWF 4h 400/800 86kg 2/2 P4R AVF Hep 8000 Sensipar 60 TIW Calcitriol 1.25 TIW Mircera 50 q 2 weeks (last 4/29)  Assessment/Plan: 1. ESRD: on HD MWF. HD today.   2.  Hyperkalemia: use 1K bath during early HD. Better.   3. PAD with LLE critical limb ischemia: S/pL fem-pop bypass  4. Volume - up 4 kg today, UF attempt 3 L will see if he will tolerate  4. Ischemic cardiomyopathy / severe AS / EF 35-40% - chronic hypotension related to these issues. stable  5. Anemia (d/t ESRD and ABLA): On aranesp and nulecit - increased both doses. SP 200 ug darbe on 5/20 here, next dose due tomorrow 5/27. Transfused 1 unit PRBC'son 5/13, 5/17, 5/20 and 2u on 5/22 (total 5u).  Hb now 10-11 range. B12, folate ok.  Tsat 25%, on IV here.  Stool guiac pend.  6. Secondary hyperparathyroidism: Continue home velphoro, Sensipar, calcitriol   7. Elevated troponin: S/p cath with severe single vessel CAD chronic total chronic occlusion of RCA. Medical therapy recommended. No targets for PCI.   [name]  MD 08/01/2018, 1:21 PM  Additional Objective Labs: Basic Metabolic Panel: Recent Labs  Lab 08/01/18 1300 08/04/18 1513 08/06/18 1416  NA 134* 135 134*  K 4.0  4.6 4.1  CL 93* 96* 94*  CO2 24 21* 23  GLUCOSE 100* 118* 110*  BUN 55* 71* 54*  CREATININE 8.33* 10.20* 8.79*  CALCIUM 9.1 8.9 9.1  PHOS 5.6* 4.9* 5.2*   Liver Function Tests: Recent Labs  Lab 08/01/18 1300 08/04/18 1513 08/06/18 1416  ALBUMIN 2.8* 2.9* 2.8*   CBC: Recent Labs  Lab 08/01/18 1305 08/02/18 1419 08/04/18 1110 08/04/18 1512  WBC 5.6  --  11.7* 11.6*  NEUTROABS  --   --  10.3*  --   HGB 6.3* 11.4* 11.6* 10.8*  HCT 19.7* 34.3* 35.4* 33.4*  MCV 102.6*  --  98.6 99.4  PLT 116*  --  121* 128*   Medications: . ferric gluconate (FERRLECIT/NULECIT) IV 125 mg (08/06/18 1546)   . calcitRIOL      . calcitRIOL      . aspirin EC  81 mg Oral Daily  . atorvastatin  80 mg Oral Q supper  . calcitRIOL  1.25 mcg Oral Q M,W,F-HD  . Chlorhexidine Gluconate Cloth  6 each Topical Q0600  . cinacalcet  60 mg Oral Q M,W,F-HD  . clopidogrel  75 mg Oral Q supper  . darbepoetin (ARANESP) injection - DIALYSIS  200 mcg Intravenous Q Wed-HD  . docusate sodium  100 mg Oral TID  .  gabapentin  300 mg Oral q morning - 10a  . heparin  5,000 Units Subcutaneous Q8H  . [START ON 08/07/2018] heparin  8,000 Units Dialysis Once in dialysis  . loratadine  10 mg Oral Daily  . pantoprazole  40 mg Oral Daily  . polyethylene glycol  17 g Oral BID  . sucroferric oxyhydroxide  1,000 mg Oral TID WC  . vitamin B-12  1,000 mcg Oral Daily

## 2018-08-06 NOTE — Progress Notes (Signed)
Occupational Therapy Session Note  Patient Details  Name: Jose Garrett MRN: [id number] Date of Birth: 08/20/1950  Today's Date: 08/06/2018 OT Individual Time: 0730-0830 OT Individual Time Calculation (min): 60 min    Short Term Goals: Week 2:  OT Short Term Goal 1 (Week 2): STG=LTG 2/2 ELOS  Skilled Therapeutic Interventions/Progress Updates:    Pt received supine c/o "discomfort" along incision, no requst for intervention. Pt donned R ted hose and sock with min A, max A for L ted/sock despite cueing for positioning and propping. Pt completed stand pivot transfer to w/c with CGA. Pt was transported to therapy gym for time management. Pt completed ~5 ft of functional mobility to therapy mat with CGA. Pt with significant edema in BLE, L>R. Pt completed calf raises unilaterally with cueing for technique. Edu provided throughout session on calves and relationship to blood/fluid return. Pt then completed ascending and descending 3 in wedge to promote fluid return and increase flexibility at dorsi/plantarflexion, as well as improve dynamic standing balance. With each trial pt had increased flexibility, endurance, and decreased RW reliance. Pt then completed functional stepping activity with 4 in step to simulate stepping onto stoop at home. CGA provided throughout all balance activities with rest breaks provided between all trials. Pt completed functional mobility back to w/c with close (S). Pt was left sitting up in recliner with all needs met, ice pack provided for groin pain from incision.   Therapy Documentation Precautions:  Precautions Precautions: Fall Restrictions Weight Bearing Restrictions: No   Therapy/Group: Individual Therapy  Curtis Sites 08/06/2018, 7:16 AM

## 2018-08-07 ENCOUNTER — Inpatient Hospital Stay (HOSPITAL_COMMUNITY): Payer: Medicare Other | Admitting: Physical Therapy

## 2018-08-07 ENCOUNTER — Inpatient Hospital Stay (HOSPITAL_COMMUNITY): Payer: Medicare Other | Admitting: Occupational Therapy

## 2018-08-07 ENCOUNTER — Inpatient Hospital Stay (HOSPITAL_COMMUNITY): Payer: Medicare Other

## 2018-08-07 ENCOUNTER — Telehealth: Payer: Self-pay

## 2018-08-07 LAB — CBC WITH DIFFERENTIAL/PLATELET
Abs Immature Granulocytes: 0.03 10*3/uL (ref 0.00–0.07)
Basophils Absolute: 0 10*3/uL (ref 0.0–0.1)
Basophils Relative: 0 %
Eosinophils Absolute: 0.2 10*3/uL (ref 0.0–0.5)
Eosinophils Relative: 4 %
HCT: 32.6 % — ABNORMAL LOW (ref 39.0–52.0)
Hemoglobin: 10.8 g/dL — ABNORMAL LOW (ref 13.0–17.0)
Immature Granulocytes: 1 %
Lymphocytes Relative: 18 %
Lymphs Abs: 1 10*3/uL (ref 0.7–4.0)
MCH: 32.2 pg (ref 26.0–34.0)
MCHC: 33.1 g/dL (ref 30.0–36.0)
MCV: 97.3 fL (ref 80.0–100.0)
Monocytes Absolute: 0.5 10*3/uL (ref 0.1–1.0)
Monocytes Relative: 9 %
Neutro Abs: 3.7 10*3/uL (ref 1.7–7.7)
Neutrophils Relative %: 68 %
Platelets: 121 10*3/uL — ABNORMAL LOW (ref 150–400)
RBC: 3.35 MIL/uL — ABNORMAL LOW (ref 4.22–5.81)
RDW: 16.6 % — ABNORMAL HIGH (ref 11.5–15.5)
WBC: 5.5 10*3/uL (ref 4.0–10.5)
nRBC: 0 % (ref 0.0–0.2)

## 2018-08-07 MED ORDER — CHLORHEXIDINE GLUCONATE CLOTH 2 % EX PADS: 6.0000 | MEDICATED_PAD | Freq: Every day | CUTANEOUS | Status: DC

## 2018-08-07 NOTE — Progress Notes (Addendum)
Physical Therapy Session Note  Patient Details  Name: Jose Garrett MRN: [id number] Date of Birth: 03-Oct-1950  Today's Date: 08/07/2018 PT Individual Time: 1030-1125 PT Individual Time Calculation (min): 55 min   Short Term Goals: Week 2:  PT Short Term Goal 1 (Week 2): =LTGs due to ELOS  Skilled Therapeutic Interventions/Progress Updates:   Pt in w/c and agreeable to therapy, pain 6/10 in LLE. Pt self-propelled w/c to/from therapy gym w/ BUEs to work on endurance training. Practiced negotiating curb/threshold as per home set-up, negotiated backwards w/ RW and CGA. Worked on static standing balance w/o UE support to simulate functional tasks at home that require bimanual use in standing. Close supervision-CGA in 2-3 min bouts. Pt's stance slightly wobbly, but pt adequately performing hip and ankle balance strategies to maintain static stance. Pt w/ increased work of breathing w/ all activity, states he feels he "overdid it" in PT this morning. Educated on energy conservation strategies w/ ADLs and household tasks. Tasks performed were cognitive challenges including pipe tree and puzzle. Per discussion w/ treatment team and patient, he recognizes that he has demonstrated cognitive deficits during this hospital stay. Educated pt on importance of performing games/puzzles in his everyday life to keep his brain sharp and engaged. pt verbalized understanding and in agreement. Returned to room and ended session in supine, all needs in reach. LLE elevated above heart level for swelling management.   Therapy Documentation Precautions:  Precautions Precautions: Fall Restrictions Weight Bearing Restrictions: No Pain: Pain Assessment Pain Scale: 0-10 Pain Score: Asleep Pain Type: Chronic pain Pain Location: Foot Pain Orientation: Right;Left Pain Descriptors / Indicators: Aching;Discomfort;Grimacing Pain Intervention(s): Medication (See eMAR)  Therapy/Group: Individual Therapy  Sasan Wilkie Clent Demark 08/07/2018, 11:29 AM

## 2018-08-07 NOTE — Telephone Encounter (Signed)
Call received from [facility], LCSW requesting hospital follow up appointment for patient. He will also need to establish care with PCP after that.   Informed her that appointment has been scheduled for 08/21/2018 @ 0930.

## 2018-08-07 NOTE — Progress Notes (Signed)
Social Work Patient ID: Jose Garrett, male   DOB: 1950/10/01, 68 y.o.   MRN: [id number]   Have reviewed team conference with pt's daughter, [name] and with pt.  Both aware and agreeable with team recommendation to move d/c date to 6/3 and of new goals of supervision.  Both feel that family can provide for him.  Daughter reports she was able to review pt's case with renal service and very appreciative.  She notes family is hopeful that his cognition will continue to improve over the next few days and with return to home.  Discussed need for primary MD.  Pt is agreeable with referral to restart at [place] - have set up appointment with Dr. [name] on 6/11.  Looking into other available support services as well.  Continue to follow.  Kelena Garrow, LCSW

## 2018-08-07 NOTE — Progress Notes (Signed)
[facility] Progress Note   Subjective: doing well,  -3L off yest   Objective Vitals:   08/06/18 1942 08/07/18 0500 08/07/18 0512 08/07/18 1004  BP: 107/66  (!) 101/59   Pulse: 91  81   Resp: 17     Temp: 98 F (36.7 C)  98.6 F (37 C)   TempSrc:      SpO2: 97%  96%   Weight:  91.4 kg  86.9 kg  Height:       Physical Exam General: Elderly man, NAD Heart: RRR; 2/6 murmur Lungs: CTA anteriorly Abdomen: soft, non-tender Extremities: No RLE edema, 2+ LLE edema Dialysis Access:  AVF + thrill   home meds:   - aspirin 81/ clopidogrel 75 hs/ atorvastatin 80   - gabapentin 300mg  am+ 900mg  hs/ oxycodone IR prn/ oxycontin 10 bid   - sucroferric oxyhydroxide 2-3 tabs ac tid   - prn's/ vitamins/ supplements    [facility] MWF 4h 400/800 86kg 2/2 P4R AVF Hep 8000 Sensipar 60 TIW Calcitriol 1.25 TIW Mircera 50 q 2 weeks (last 4/29)  Assessment/Plan: 1. ESRD: on HD MWF. HD Friday  2.  Hyperkalemia: use 1K bath during early HD. Better.   3. PAD with LLE critical limb ischemia: S/pL fem-pop bypass  4. Volume excess - vol coming down, LLE edema better, down to edw today  4. Ischemic cardiomyopathy / severe AS / EF 35-40% - chronic hypotension related to these issues. stable  5. Anemia (d/t ESRD and ABLA): On aranesp and nulecit - increased both doses. SP 200 ug darbe on 5/20 here, next dose due tomorrow 5/27. Transfused 1 unit PRBC'son 5/13, 5/17, 5/20 and 2u on 5/22 (total 5u).  Hb now 10-11 range. B12, folate ok.  Tsat 23%, will order small Fe load 500 mg over 4-5 sessions.  Stool guiac is negative.  6. Secondary hyperparathyroidism: Continue home velphoro, Sensipar, calcitriol   7. Elevated troponin: S/p cath with severe single vessel CAD chronic total chronic occlusion of RCA. Medical therapy recommended. No targets for PCI.   [name]  MD 08/01/2018, 1:21 PM  Additional Objective Labs: Basic Metabolic Panel: Recent Labs  Lab  08/01/18 1300 08/04/18 1513 08/06/18 1416  NA 134* 135 134*  K 4.0 4.6 4.1  CL 93* 96* 94*  CO2 24 21* 23  GLUCOSE 100* 118* 110*  BUN 55* 71* 54*  CREATININE 8.33* 10.20* 8.79*  CALCIUM 9.1 8.9 9.1  PHOS 5.6* 4.9* 5.2*   Liver Function Tests: Recent Labs  Lab 08/01/18 1300 08/04/18 1513 08/06/18 1416  ALBUMIN 2.8* 2.9* 2.8*   CBC: Recent Labs  Lab 08/01/18 1305  08/04/18 1110 08/04/18 1512 08/07/18 0608  WBC 5.6  --  11.7* 11.6* 5.5  NEUTROABS  --   --  10.3*  --  3.7  HGB 6.3*   < > 11.6* 10.8* 10.8*  HCT 19.7*   < > 35.4* 33.4* 32.6*  MCV 102.6*  --  98.6 99.4 97.3  PLT 116*  --  121* 128* 121*   < > = values in this interval not displayed.   Medications: . ferric gluconate (FERRLECIT/NULECIT) IV 125 mg (08/06/18 1546)   . aspirin EC  81 mg Oral Daily  . atorvastatin  80 mg Oral Q supper  . calcitRIOL  1.25 mcg Oral Q M,W,F-HD  . Chlorhexidine Gluconate Cloth  6 each Topical Q0600  . cinacalcet  60 mg Oral Q M,W,F-HD  . clopidogrel  75 mg Oral Q supper  . darbepoetin (  ARANESP) injection - DIALYSIS  200 mcg Intravenous Q Wed-HD  . docusate sodium  100 mg Oral TID  . gabapentin  300 mg Oral q morning - 10a  . heparin  5,000 Units Subcutaneous Q8H  . loratadine  10 mg Oral Daily  . pantoprazole  40 mg Oral Daily  . polyethylene glycol  17 g Oral BID  . sucroferric oxyhydroxide  1,000 mg Oral TID WC  . vitamin B-12  1,000 mcg Oral Daily

## 2018-08-07 NOTE — Plan of Care (Signed)
  Problem: Consults Goal: RH GENERAL PATIENT EDUCATION Description See Patient Education module for education specifics. Outcome: Progressing Goal: Skin Care Protocol Initiated - if Braden Score 18 or less Description If consults are not indicated, leave blank or document N/A Outcome: Progressing Goal: Nutrition Consult-if indicated Outcome: Progressing Goal: Diabetes Guidelines if Diabetic/Glucose > 140 Description If diabetic or lab glucose is > 140 mg/dl - Initiate Diabetes/Hyperglycemia Guidelines & Document Interventions  Outcome: Progressing   Problem: RH BOWEL ELIMINATION Goal: RH STG MANAGE BOWEL WITH ASSISTANCE Description STG Manage Bowel with min.Assistance.  Outcome: Progressing Flowsheets (Taken 08/07/2018 1310) STG: Pt will manage bowels with assistance: 4-Minimum assistance Goal: RH STG MANAGE BOWEL W/MEDICATION W/ASSISTANCE Description STG Manage Bowel with Medication with min. Assistance.  Outcome: Progressing Flowsheets (Taken 08/07/2018 1310) STG: Pt will manage bowels with medication with assistance: 4-Minimal assistance   Problem: RH BLADDER ELIMINATION Goal: RH STG MANAGE BLADDER WITH ASSISTANCE Description STG Manage Bladder With min.Assistance  Outcome: Progressing Flowsheets (Taken 08/07/2018 1310) STG: Pt will manage bladder with assistance: 4-Minimal assistance   Problem: RH SKIN INTEGRITY Goal: RH STG SKIN FREE OF INFECTION/BREAKDOWN Description With mod. assist  Outcome: Progressing Goal: RH STG MAINTAIN SKIN INTEGRITY WITH ASSISTANCE Description STG Maintain Skin Integrity With mod.Assistance.  Outcome: Progressing Flowsheets (Taken 08/07/2018 1310) STG: Maintain skin integrity with assistance: 4-Minimal assistance Goal: RH STG ABLE TO PERFORM INCISION/WOUND CARE W/ASSISTANCE Description STG Able To Perform Incision/Wound Care With Mod.Assistance.  Outcome: Progressing Flowsheets (Taken 08/07/2018 1310) STG: Pt will be able to perform  incision/wound care with assistance: 4-Minimal assistance   Problem: RH SAFETY Goal: RH STG ADHERE TO SAFETY PRECAUTIONS W/ASSISTANCE/DEVICE Description STG Adhere to Safety Precautions With Mod.Assistance/Device.  Outcome: Progressing Flowsheets (Taken 08/07/2018 1310) STG:Pt will adhere to safety precautions with assistance/device: 4-Minimal assistance Goal: RH STG DECREASED RISK OF FALL WITH ASSISTANCE Description STG Decreased Risk of Fall With Mod. Assistance.  Outcome: Progressing Flowsheets (Taken 08/07/2018 1310) [id number] risk of fall  with assistance/device: 4-Minimal assistance   Problem: RH PAIN MANAGEMENT Goal: RH STG PAIN MANAGED AT OR BELOW PT'S PAIN GOAL Description Less than 3  Outcome: Progressing   Problem: RH KNOWLEDGE DEFICIT GENERAL Goal: RH STG INCREASE KNOWLEDGE OF SELF CARE AFTER HOSPITALIZATION Description Pt. Will be able to verbalized how to manage his medications and post op. Care at home   Outcome: Progressing

## 2018-08-07 NOTE — Progress Notes (Signed)
[facility] PHYSICAL MEDICINE & REHABILITATION PROGRESS NOTE  Subjective/Complaints: Patient seen laying in bed this morning.  He states he slept well overnight.  Discussed with nursing distal bypass incision.  He has questions regarding discharge date.  He was seen by nephrology yesterday, notes reviewed.  ROS: Denies CP, shortness of breath, nausea, vomiting, diarrhea.  Objective: Vital Signs: Blood pressure (!) 101/59, pulse 81, temperature 98.6 F (37 C), resp. rate 17, height 5\' 10"  (1.778 m), weight 91.4 kg, SpO2 96 %. No results found. Recent Labs    08/04/18 1512 08/07/18 0608  WBC 11.6* 5.5  HGB 10.8* 10.8*  HCT 33.4* 32.6*  PLT 128* 121*   Recent Labs    08/04/18 1513 08/06/18 1416  NA 135 134*  K 4.6 4.1  CL 96* 94*  CO2 21* 23  GLUCOSE 118* 110*  BUN 71* 54*  CREATININE 10.20* 8.79*  CALCIUM 8.9 9.1    Physical Exam: BP (!) 101/59 (BP Location: Right Arm)   Pulse 81   Temp 98.6 F (37 C)   Resp 17   Ht 5\' 10"  (1.778 m)   Wt 91.4 kg   SpO2 96%   BMI 28.91 kg/m  Constitutional: No distress . Vital signs reviewed. HENT: Normocephalic.  Atraumatic. Eyes: EOMI.  No discharge. Cardiovascular: No JVD. Respiratory: Normal effort. GI: Non-distended. Musculoskeletal: Left foot >right lower extremity edema, stable Neurological:  Alert and oriented Follows commands.  Motor: Right lower extremity: 4/5 proximal distal, stable Left lower extremity: Hip flexion, knee extension 2-2+/5, ankle dorsiflexion 4-/5, stable Skin: Skin iswarm. He isnot diaphoretic. Femoropopliteal bypass site C/D/I  Circular appearing erythema with fluid at distal end of bypass site Psychiatric: He has a normal mood and affect. Hisbehavior is normal.Judgmentand thought contentnormal.   Assessment/Plan: 1. Functional deficits secondary to debility which require 3+ hours per day of interdisciplinary therapy in a comprehensive inpatient rehab setting.  Physiatrist is  providing close team supervision and 24 hour management of active medical problems listed below.  Physiatrist and rehab team continue to assess barriers to discharge/monitor patient progress toward functional and medical goals  Care Tool:  Bathing    Body parts bathed by patient: Right arm, Left arm, Chest, Abdomen, Front perineal area, Left upper leg, Right upper leg, Face, Buttocks   Body parts bathed by helper: Right lower leg, Left lower leg Body parts n/a: Left lower leg   Bathing assist Assist Level: Contact Guard/Touching assist     Upper Body Dressing/Undressing Upper body dressing   What is the patient wearing?: Pull over shirt    Upper body assist Assist Level: Independent    Lower Body Dressing/Undressing Lower body dressing      What is the patient wearing?: Underwear/pull up, Pants     Lower body assist Assist for lower body dressing: Contact Guard/Touching assist     Toileting Toileting    Toileting assist Assist for toileting: Minimal Assistance - Patient > 75%     Transfers Chair/bed transfer  Transfers assist     Chair/bed transfer assist level: Contact Guard/Touching assist     Locomotion Ambulation   Ambulation assist   Ambulation activity did not occur: Safety/medical concerns  Assist level: Contact Guard/Touching assist Assistive device: Walker-rolling Max distance: 75'   Walk 10 feet activity   Assist  Walk 10 feet activity did not occur: Safety/medical concerns  Assist level: Contact Guard/Touching assist Assistive device: Walker-rolling   Walk 50 feet activity   Assist Walk 50 feet with 2 turns  activity did not occur: Safety/medical concerns  Assist level: Contact Guard/Touching assist Assistive device: Walker-rolling    Walk 150 feet activity   Assist Walk 150 feet activity did not occur: Safety/medical concerns         Walk 10 feet on uneven surface  activity   Assist Walk 10 feet on uneven surfaces  activity did not occur: Safety/medical concerns         Wheelchair     Assist Will patient use wheelchair at discharge?: Yes Type of Wheelchair: Manual    Wheelchair assist level: Supervision/Verbal cueing Max wheelchair distance: 150'    Wheelchair 50 feet with 2 turns activity    Assist        Assist Level: Supervision/Verbal cueing   Wheelchair 150 feet activity     Assist     Assist Level: Supervision/Verbal cueing      Medical Problem List and Plan: 1.Debilitysecondary to PAD status post left common femoral to below-knee popliteal artery bypass 08/30/3084 complicated by non-STEMI  Continue CIR 2. Antithrombotics: -DVT/anticoagulation:Subcutaneous heparin -antiplatelet therapy: Aspirin 81 mg daily, Plavix 75 mg daily 3. Pain Management:Neurontin 300 mg every evening, oxycodone as needed 4. Mood:Provide emotional support -antipsychotic agents: N/A 5. Neuropsych: This patientiscapable of making decisions on hisown behalf. 6. Skin/Wound Care:Routine skin checks. Local skin care, protection to necrotic areas along toes of left foot.  Left toes are warm right toes are cool and dusky but not painful  Will need to monitor closely for left lower extremity.  Monitor circular lesion with fluid as well-will discuss with vascular   Daily Dopplers and elevation per cardiovascular 7. Fluids/Electrolytes/Nutrition: Routine in and outs 8. Acute blood loss anemia. Continue Aranesp   Hemoglobin 10.8 on 5/28  Continue to monitor 9.Hypotension/aortic stenosis.   Blood pressures low, but improved and asymptomatic on 5/28 10. End-stage renal disease. Continue hemodialysis as directed.  Recs per nephro  11. Hyperlipidemia. Lipitor 12. Chronic combined diastolic systolic congestive heart failure with EF of 35-40% and severe left ear. Monitor for any signs of fluid overload  Daily weights ordered Filed Weights   08/06/18 1308  08/06/18 1727 08/07/18 0500  Weight: 89.9 kg 87.5 kg 91.4 kg   ?  Reliability on 5/28 13.Constipation. Laxative assistance 14.  Thrombocytopenia  Platelets 121 on 5/28  Continue to monitor 15.  Severe constipation  Bowel meds increased on 5/21  Will consider further increase in medications if necessary 16.  Severe CAD  Single-vessel disease total occlusion of RCA  Medical management 17.  Leukocytosis: Resolved  WBCs 5.5 on 5/28  Afebrile  Continue to monitor 18. GERD  Cont Protonix  TUMS prn started on 5/26  LOS: 10 days A FACE TO FACE EVALUATION WAS PERFORMED  Jernard Reiber Lorie Phenix 08/07/2018, 9:08 AM

## 2018-08-07 NOTE — Progress Notes (Signed)
Physical Therapy Session Note  Patient Details  Name: Jose Garrett MRN: [id number] Date of Birth: September 29, 1950  Today's Date: 08/07/2018 PT Individual Time: 0803-0900 PT Individual Time Calculation (min): 57 min   Short Term Goals: Week 2:  PT Short Term Goal 1 (Week 2): =LTGs due to ELOS  Skilled Therapeutic Interventions/Progress Updates:    Patient received sitting EOB finishing breakfast, agreeable to PT session. Able to sit with modified figure 4 to doff sock, total assist from PT to don TED hose to L LE. Sit to stand at bedside with CGA for safety with cueing for hand placement for efficiency and improved independence. Ambulating in hallway with RW for 84 feet with CGA. Cueing to increase R step length, weight shift to L LE, and posturing with good carryover. Patient self-propelling manual w/c for 120 feet with B UE promoting improved UE endurance.  NuStep L3 x 6 min with B UE/LE for improved activity tolerance, strengthening as well as focus on LE stretching. Patient reporting L quad and gastroc stretch to tolerance with this. Ambulating with RW for 95 feet with CGA - improved step length as compared to earlier trial. Does continue to require cueing for posturing and forward gaze.   Standing ther-ex at [facility] with high knee marches x 10 B LE, L hip abduction x 10, L HS curls x 10 to improve strength needed for functional mobility. Attempted to perform on R LE however, patient reporting pain with increased weight bearing. Cueing throughout for form and control of movement. Patient sitting in w/c at end of session with call bell near and all needs met.   Therapy Documentation Precautions:  Precautions Precautions: Fall Restrictions Weight Bearing Restrictions: No General:   Vital Signs: Therapy Vitals Temp: 98.6 F (37 C) Pulse Rate: 81 BP: (!) 101/59 Patient Position (if appropriate): Sitting Oxygen Therapy SpO2: 96 % O2 Device: Room Air Pain: Pain Assessment Pain Scale:  0-10 Pain Score: 5  Pain Type: Chronic pain Pain Location: Leg Pain Orientation: Right;Left Pain Descriptors / Indicators: Aching;Discomfort;Grimacing Pain Intervention(s): Refused;Repositioned;Food    Therapy/Group: Individual Therapy   Lanney Gins, PT, DPT Supplemental Physical Therapist 08/07/18 8:27 AM Pager: [phone] Office: [phone]

## 2018-08-07 NOTE — Progress Notes (Signed)
Occupational Therapy Session Note  Patient Details  Name: MARQUIST BINSTOCK MRN: [id number] Date of Birth: [birth year]  Today's Date: 08/07/2018 OT Individual Time: 1310-1418 OT Individual Time Calculation (min): 68 min   Short Term Goals: Week 2:  OT Short Term Goal 1 (Week 2): STG=LTG 2/2 ELOS  Skilled Therapeutic Interventions/Progress Updates:     Pt greeted seated EOB talking to case manager and agreeable to OT treatment session. Pt stated he "over did it" with PT this morning, but agreeable to participate in OT. Sit<>stand from EOB with RW and CGA. Pt then ambulated 5 feet to the wc with CGA. B UE strength/coordination with wc propulsion to therapy gym. Stand-pivot to arm bike seat with CGA and no AD. Pt completed 5 mins x 3 on [employer] arm bike on level 2 for UB strengthening. Continued worked on [employer] strength and standing balance using 1 lb dowel rod. 3 sets of 10 straight arm raises, bicep curls, chest press. CGA for balance with standing tasks with intermittent anterior LOB requiring min A to correct. Seated chest press ball toss activity 3 sets of 10. Pt ambulated back to room at end of session with RW and CGA. Pt returned to bed and left semi-reclined in bed with bed alarm on and needs met.   Therapy Documentation Precautions:  Precautions Precautions: Fall Restrictions Weight Bearing Restrictions: No Vital Signs: Therapy Vitals BP: (!) 92/53 Pain: Pain Assessment Pain Scale: 0-10 Pain Score: 6  Pain Type: Chronic pain Pain Location: Back Pain Orientation: Lower Pain Descriptors / Indicators: Aching;Discomfort Pain Intervention(s): Repositioned;RN made aware   Therapy/Group: Individual Therapy  Valma Cava 08/07/2018, 2:17 PM

## 2018-08-07 NOTE — Patient Care Conference (Signed)
Inpatient RehabilitationTeam Conference and Plan of Care Update Date: 08/06/2018   Time: 2:45 PM    Patient Name: Jose Garrett      Medical Record Number: [id number]  Date of Birth: [birth year] Sex: Male         Room/Bed: 4M08C/4M08C-01 Payor Info: Payor: MEDICARE / Plan: MEDICARE PART A AND B / Product Type: *No Product type* /    Admitting Diagnosis: ABI Team  Debility, malaise; 13-15days  Admit Date/Time:  07/28/2018  6:35 PM Admission Comments: No comment available   Primary Diagnosis:  <principal problem not specified> Principal Problem: <principal problem not specified>  Patient Active Problem List   Diagnosis Date Noted  . Leukocytosis   . Chronic diastolic congestive heart failure ([place])   . Gastroesophageal reflux disease   . Coronary artery disease involving native coronary artery of native heart without angina pectoris   . Drug induced constipation   . Obstipation   . Acute on chronic anemia   . Thrombocytopenia ([place])   . Chronic combined systolic and diastolic congestive heart failure ([place])   . ESRD on dialysis ([place])   . Hemodialysis-associated hypotension   . Acute blood loss anemia   . Debility 07/28/2018  . PAD (peripheral artery disease) ([place]) 07/22/2018  . Shortness of breath 03/25/2018  . Chest pain   . Dyspnea 03/23/2018  . Acute on chronic combined systolic and diastolic CHF (congestive heart failure) ([place]) 11/30/2017  . Non-ST elevation (NSTEMI) myocardial infarction ([place])   . Elevated troponin 11/25/2017  . Respiratory failure ([place])   . Neuropathy 03/14/2017  . Fever   . CKD (chronic kidney disease) stage 5, GFR less than 15 ml/min (HCC)   . ESRD (end stage renal disease) on dialysis ([place])   . Acute renal failure superimposed on chronic kidney disease ([place])   . Hypervolemia   . Acute diastolic (congestive) heart failure ([place]) 01/23/2016  . Fluid overload, unspecified 01/23/2016  . Acute pulmonary edema ([place]) 01/23/2016  . Skin tear of right forearm  without complication 40/34/7425  . Iron deficiency anemia 11/22/2015  . Nonrheumatic aortic valve stenosis 08/22/2015  . Chronic kidney disease with dialysis modality undecided, stage 5 ([place]): Progressive 08/01/2015  . B12 deficiency 07/19/2015  . Seasonal allergies 06/30/2015  . Back pain 03/18/2015  . Hip pain 02/18/2015  . AL amyloidosis ([place]) 09/25/2014  . Anemia of chronic disease 09/25/2014  . Abnormal bruising 09/25/2014  . Renal hematoma, right 09/08/2014  . Chronic diastolic heart failure, NYHA class 1 ([place]) 09/08/2014  . Hematuria   . Proteinuria   . Vitamin D deficiency 01/21/2014  . Hypertriglyceridemia 01/21/2014  . Essential hypertension, benign 11/06/2013  . Dependent edema 11/06/2013  . DOE (dyspnea on exertion) 11/06/2013  . Other malaise and fatigue 11/06/2013    Expected Discharge Date: Expected Discharge Date: 08/13/18  Team Members Present: Physician leading conference: Dr. Delice Lesch Social Worker Present: Lennart Pall, LCSW Nurse Present: Leonette Nutting, RN PT Present: Burnard Bunting, PT OT Present: Willeen Cass, OT SLP Present: Weston Anna, SLP PPS Coordinator present : Gunnar Fusi     Current Status/Progress Goal Weekly Team Focus  Medical   Debility secondary to PAD status post left common femoral to below-knee popliteal artery bypass 9/56/3875 complicated by non-STEMI  Improve mobility, platelets, LE edema, WBCs, hypotension  See above   Bowel/Bladder   pt is continent of B/B  pt anuria LBM 05/26   continue dialysis remain continent of bowel with normal bowel patter  foley care assess  need for foley laxatives prn   Swallow/Nutrition/ Hydration             ADL's   Min A sit<>stand, min A functional ambulation, Min A BADLs (excluding footware)  Supervision mod/I  activity tolerance, pain management, self-care retraining, pt/family education   Mobility   CGA-min assist overall, gait up to 21' w/ RW, limited by pain and fatigue  goals downgraded  to supervision overall 2/2 poor participation and fatigue  OOB tolerance, endurance, all functional mobility, LLE ROM/strengthening, swelling management   Communication             Safety/Cognition/ Behavioral Observations            Pain   pt denies any pain at this time oxy prn   pt free of pain  assess pain qshift and prn medicate as ordered   Skin   surgical incision LLE   pt will remain free from infection or skin breakdown  assess skin qshift and prn    Rehab Goals Patient on target to meet rehab goals: Yes *See Care Plan and progress notes for long and short-term goals.     Barriers to Discharge  Current Status/Progress Possible Resolutions Date Resolved   Physician    Medical stability;Wound Care     See above  Therapies, recs per Nephro for BP, follow labs, Vasc recs      Nursing                  PT                    OT                  SLP                SW                Discharge Planning/Teaching Needs:  Pt to d/c to daughter's home with intermittent assistance available.  Can possibly increase to 24/7 with ex-wife involved.  Teaching to be scheduled for next week.   Team Discussion:  Afebrile, hypotension continues but improved.  Monitor incision site and areas around toes.  Cont b/b;  CG - min assist with PT and amb ~ 75.  Min assist with OT.  Overall goals downgraded to supervision.  SW to follow up with family.  Revisions to Treatment Plan:  Goals revised.    Continued Need for Acute Rehabilitation Level of Care: The patient requires daily medical management by a physician with specialized training in physical medicine and rehabilitation for the following conditions: Daily direction of a multidisciplinary physical rehabilitation program to ensure safe treatment while eliciting the highest outcome that is of practical value to the patient.: Yes Daily medical management of patient stability for increased activity during participation in an intensive  rehabilitation regime.: Yes Daily analysis of laboratory values and/or radiology reports with any subsequent need for medication adjustment of medical intervention for : Post surgical problems;Wound care problems;Blood pressure problems   I attest that I was present, lead the team conference, and concur with the assessment and plan of the team.   Lennart Pall 08/07/2018, 3:09 PM   Team conference was held via web/ teleconference due to [place] - 19

## 2018-08-07 NOTE — Progress Notes (Signed)
   Evaluated patient in rehab.  Left lower extremity swelling.  Does not have palpable pulses but left foot does appear better perfused.  Still has perfusion issues to the toes on the right.  Given recent cardiac issues would hold on future right lower extremity bypass at this time.  He will follow-up next week with me in the office.  Smaran Gaus C. Donzetta Matters, MD [facility] Office: [phone] Pager: [phone]

## 2018-08-08 ENCOUNTER — Inpatient Hospital Stay (HOSPITAL_COMMUNITY): Payer: Medicare Other | Admitting: Occupational Therapy

## 2018-08-08 ENCOUNTER — Inpatient Hospital Stay (HOSPITAL_COMMUNITY): Payer: Medicare Other | Admitting: Physical Therapy

## 2018-08-08 LAB — RENAL FUNCTION PANEL
Albumin: 3.1 g/dL — ABNORMAL LOW (ref 3.5–5.0)
Anion gap: 14 (ref 5–15)
BUN: 50 mg/dL — ABNORMAL HIGH (ref 8–23)
CO2: 25 mmol/L (ref 22–32)
Calcium: 9.4 mg/dL (ref 8.9–10.3)
Chloride: 93 mmol/L — ABNORMAL LOW (ref 98–111)
Creatinine, Ser: 8.2 mg/dL — ABNORMAL HIGH (ref 0.61–1.24)
GFR calc Af Amer: 7 mL/min — ABNORMAL LOW (ref >60)
GFR calc non Af Amer: 6 mL/min — ABNORMAL LOW (ref >60)
Glucose, Bld: 119 mg/dL — ABNORMAL HIGH (ref 70–99)
Phosphorus: 4.7 mg/dL — ABNORMAL HIGH (ref 2.5–4.6)
Potassium: 4.2 mmol/L (ref 3.5–5.1)
Sodium: 132 mmol/L — ABNORMAL LOW (ref 135–145)

## 2018-08-08 LAB — CBC
HCT: 31.8 % — ABNORMAL LOW (ref 39.0–52.0)
Hemoglobin: 10.3 g/dL — ABNORMAL LOW (ref 13.0–17.0)
MCH: 31.8 pg (ref 26.0–34.0)
MCHC: 32.4 g/dL (ref 30.0–36.0)
MCV: 98.1 fL (ref 80.0–100.0)
Platelets: 114 10*3/uL — ABNORMAL LOW (ref 150–400)
RBC: 3.24 MIL/uL — ABNORMAL LOW (ref 4.22–5.81)
RDW: 16.4 % — ABNORMAL HIGH (ref 11.5–15.5)
WBC: 5.2 10*3/uL (ref 4.0–10.5)
nRBC: 0 % (ref 0.0–0.2)

## 2018-08-08 LAB — OCCULT BLOOD X 1 CARD TO LAB, STOOL: Fecal Occult Bld: NEGATIVE

## 2018-08-08 MED ORDER — HYDROCERIN EX CREA
TOPICAL_CREAM | Freq: Two times a day (BID) | CUTANEOUS | Status: DC
  Administered 2018-08-08 – 2018-08-13 (×10): via TOPICAL
  Filled 2018-08-08: qty 113

## 2018-08-08 NOTE — Procedures (Signed)
   I was present at this dialysis session, have reviewed the session itself and made  appropriate changes [name] MD [place] pager [phone]   08/08/2018, 2:23 PM

## 2018-08-08 NOTE — Progress Notes (Signed)
Occupational Therapy Session Note  Patient Details  Name: Jose Garrett MRN: [id number] Date of Birth: 1951-02-20  Today's Date: 08/08/2018 OT Individual Time: 0037-9444 OT Individual Time Calculation (min): 56 min    Short Term Goals: Week 2:  OT Short Term Goal 1 (Week 2): STG=LTG 2/2 ELOS  Skilled Therapeutic Interventions/Progress Updates:    Pt greeted semi-reclined in bed and agreeable to OT treatment session focused on self-care retraining. OT discussed the need for sponge baths right now instead of showering in order to keep LLE wounds dry-pt agreeable. Pt donned R sock seated EOB, then needed assistance to don L sock 2/2 pulling in groin when trying to reach figure 4 position. Pt ambulated to to sink w/ RW and CGA. Worked on standing balance and endurance to stand and wash upper body. Pt needed rest breaks after standing activities. Multiple sit<>stands within BADLs with CGA and improved power up. Worked on LB dressing strategies with min verbal cues for modified technique. Pt left seated in wc at end of session with alarm belt on and breakfast tray present.   Therapy Documentation Precautions:  Precautions Precautions: Fall Restrictions Weight Bearing Restrictions: No Pain: Pain Assessment Pain Scale: 0-10 Pain Score: 0   Therapy/Group: Individual Therapy  Valma Cava 08/08/2018, 8:06 AM

## 2018-08-08 NOTE — Progress Notes (Addendum)
[facility] PHYSICAL MEDICINE & REHABILITATION PROGRESS NOTE  Subjective/Complaints: Patient seen sitting up in his chair this morning working with therapies.  He states he slept well overnight.  He states he was seen by vascular yesterday.  ROS: Denies CP, shortness of breath, nausea, vomiting, diarrhea.  Objective: Vital Signs: Blood pressure 101/65, pulse 82, temperature 98.4 F (36.9 C), temperature source Oral, resp. rate 18, height 5\' 10"  (1.778 m), weight 89.3 kg, SpO2 96 %. No results found. Recent Labs    08/07/18 0608  WBC 5.5  HGB 10.8*  HCT 32.6*  PLT 121*   Recent Labs    08/06/18 1416  NA 134*  K 4.1  CL 94*  CO2 23  GLUCOSE 110*  BUN 54*  CREATININE 8.79*  CALCIUM 9.1    Physical Exam: BP 101/65 (BP Location: Left Arm)   Pulse 82   Temp 98.4 F (36.9 C) (Oral)   Resp 18   Ht 5\' 10"  (1.778 m)   Wt 89.3 kg   SpO2 96%   BMI 28.25 kg/m  Constitutional: No distress . Vital signs reviewed. HENT: Normocephalic.  Atraumatic. Eyes: EOMI.  No discharge. Cardiovascular: No JVD. Respiratory: Normal effort. GI: Non-distended. Musculoskeletal: Left foot >right lower extremity edema, unchanged Neurological:  Alert and oriented Follows commands.  Motor: Right lower extremity: 4/5 proximal distal, stable Left lower extremity: Hip flexion, knee extension 2-2+/5, ankle dorsiflexion 4-/5, unchanged Skin: Skin iswarm. He isnot diaphoretic. Femoropopliteal bypass site C/D/I  Circular appearing erythema with fluid at distal end of bypass site, stable Psychiatric: He has a normal mood and affect. Hisbehavior is normal.Judgmentand thought contentnormal.   Assessment/Plan: 1. Functional deficits secondary to debility which require 3+ hours per day of interdisciplinary therapy in a comprehensive inpatient rehab setting.  Physiatrist is providing close team supervision and 24 hour management of active medical problems listed below.  Physiatrist and rehab  team continue to assess barriers to discharge/monitor patient progress toward functional and medical goals  Care Tool:  Bathing    Body parts bathed by patient: Right arm, Left arm, Chest, Abdomen, Front perineal area, Left upper leg, Right upper leg, Face, Buttocks   Body parts bathed by helper: Right lower leg, Left lower leg Body parts n/a: Left lower leg   Bathing assist Assist Level: Contact Guard/Touching assist     Upper Body Dressing/Undressing Upper body dressing   What is the patient wearing?: Pull over shirt    Upper body assist Assist Level: Independent    Lower Body Dressing/Undressing Lower body dressing      What is the patient wearing?: Underwear/pull up, Pants     Lower body assist Assist for lower body dressing: Contact Guard/Touching assist     Toileting Toileting    Toileting assist Assist for toileting: Minimal Assistance - Patient > 75%     Transfers Chair/bed transfer  Transfers assist     Chair/bed transfer assist level: Contact Guard/Touching assist     Locomotion Ambulation   Ambulation assist   Ambulation activity did not occur: Safety/medical concerns  Assist level: Contact Guard/Touching assist Assistive device: Walker-rolling Max distance: 95   Walk 10 feet activity   Assist  Walk 10 feet activity did not occur: Safety/medical concerns  Assist level: Contact Guard/Touching assist Assistive device: Walker-rolling   Walk 50 feet activity   Assist Walk 50 feet with 2 turns activity did not occur: Safety/medical concerns  Assist level: Contact Guard/Touching assist Assistive device: Walker-rolling    Walk 150 feet activity  Assist Walk 150 feet activity did not occur: Safety/medical concerns         Walk 10 feet on uneven surface  activity   Assist Walk 10 feet on uneven surfaces activity did not occur: Safety/medical concerns         Wheelchair     Assist Will patient use wheelchair at  discharge?: Yes Type of Wheelchair: Manual    Wheelchair assist level: Supervision/Verbal cueing Max wheelchair distance: 150'    Wheelchair 50 feet with 2 turns activity    Assist        Assist Level: Supervision/Verbal cueing   Wheelchair 150 feet activity     Assist     Assist Level: Supervision/Verbal cueing      Medical Problem List and Plan: 1.Debilitysecondary to PAD status post left common femoral to below-knee popliteal artery bypass 07/28/8414 complicated by non-STEMI  Continue CIR 2. Antithrombotics: -DVT/anticoagulation:Subcutaneous heparin -antiplatelet therapy: Aspirin 81 mg daily, Plavix 75 mg daily 3. Pain Management:Neurontin 300 mg every evening, oxycodone as needed 4. Mood:Provide emotional support -antipsychotic agents: N/A 5. Neuropsych: This patientiscapable of making decisions on hisown behalf. 6. Skin/Wound Care:Routine skin checks. Local skin care, protection to necrotic areas along toes of left foot.   Will need to monitor closely for left lower extremity.  Monitor circular lesion with fluid as well- no further changes at present per vascular-follow-up as outpatient.  Eucerin cream added, NOT not be applied near nectoric areas 7. Fluids/Electrolytes/Nutrition: Routine in and outs 8. Acute blood loss anemia. Continue Aranesp   Hemoglobin 10.8 on 5/28  Continue to monitor 9.Hypotension/aortic stenosis.   Blood pressures low, but stable and asymptomatic on 5/29 10. End-stage renal disease. Continue hemodialysis as directed.  Recs per nephro  11. Hyperlipidemia. Lipitor 12. Chronic combined diastolic systolic congestive heart failure with EF of 35-40% and severe left ear. Monitor for any signs of fluid overload  Daily weights ordered Filed Weights   08/07/18 0500 08/07/18 1004 08/08/18 0500  Weight: 91.4 kg 86.9 kg 89.3 kg   ?  Reliability on 5/29 13.Constipation. Laxative assistance 14.   Thrombocytopenia  Platelets 121 on 5/28  Continue to monitor 15.  Severe constipation  Bowel meds increased on 5/21  Will consider further increase in medications if necessary 16.  Severe CAD  Single-vessel disease total occlusion of RCA  Medical management 17.  Leukocytosis: Resolved  WBCs 5.5 on 5/28  Afebrile  Continue to monitor 18. GERD  Cont Protonix  TUMS prn started on 5/26  LOS: 11 days A FACE TO FACE EVALUATION WAS PERFORMED  Sammie Schermerhorn Lorie Phenix 08/08/2018, 9:20 AM

## 2018-08-08 NOTE — Progress Notes (Signed)
Physical Therapy Session Note  Patient Details  Name: Jose Garrett MRN: [id number] Date of Birth: 08/03/1950  Today's Date: 08/08/2018 PT Individual Time: 0900-0957 AND 1607-3710 PT Individual Time Calculation (min): 57 min AND 60 min  Short Term Goals: Week 2:  PT Short Term Goal 1 (Week 2): =LTGs due to ELOS  Skilled Therapeutic Interventions/Progress Updates:   Session 1:  Pt in w/c and agreeable to therapy, pain 610 in LLE. Pt self-propelled w/c to/from therapy gym w/ supervision using BUEs. Worked on standing balance tasks this session, sit<>stands w/o RW support in front w/ min assist to stabilize. Stood to [employer], toss horseshoes, and pin clothespins to basketball hoop. CGA for all balance tasks, pt adequately making hip and ankle balance strategies to maintain static stance. Worked on progressively narrowing BOS as well, but did not get all the way to feet touching. LLE ROM/strengthening exercises during seated rest breaks including LAQs 3x10, knee marches 3x10, and heel/toe rocks 3x10. Ambulated back to room w/ close supervision using RW, 150' w/o rest breaks. Ended session in supine, LLE elevated above heart level and ice applied to L lower leg at incision site. Called and discussed CLOF w/ daughter, [name], who patient is discharging home with. Daughter appreciative of phone call and planning to arrange for 24/7 supervision.   Session 2:  Pt in supine and agreeable to therapy, pain continues to be 6/10 in LLE. Bed mobility w/ supervision and CGA stand pivot to w/c. Pt self-propelled w/c to/from day room w/ supervision using BUEs to work on UE strengthening and endurance training, multiple rest breaks 2/2 fatigue each way. NuStep 12 min @ level 3 to work on endurance and LLE ROM/strengthening. Intermittent bouts of LEs only, but only able to tolerate a few rotations at a time. Practiced car transfer in preparation for d/c next week, CGA w/ verbal and visual cues for technique. Pt  asking about driving again, discussed importance of LE proprioception and sensation for safe driving. Pt admits he had an episode earlier in 2020 in which he thought he was on the brake pedal, but was not. Pt seems to have good safety awareness and is able to verbalize why it may be unsafe for him to drive at this time. Discussed other options including public transport and SCAT. Encouraged him to follow up with his MD for further clarification. Returned to room via w/c, ended session in w/c and all needs in reach.   Therapy Documentation Precautions:  Precautions Precautions: Fall Restrictions Weight Bearing Restrictions: No  Therapy/Group: Individual Therapy  Saje Gallop K Shahzaib Azevedo 08/08/2018, 10:00 AM

## 2018-08-08 NOTE — Plan of Care (Signed)
Patient stable, discussed POC with patient, agreeable with plan, denies question/concerns at this time.  Problem: RH PAIN MANAGEMENT Goal: RH STG PAIN MANAGED AT OR BELOW PT'S PAIN GOAL Description Less than 3  08/08/2018 1420 by Adalis Gatti N, RN Outcome: Progressing 08/08/2018 1359 by Caralynn Gelber N, RN Outcome: Progressing Patient denies pain.  Problem: RH KNOWLEDGE DEFICIT GENERAL Goal: RH STG INCREASE KNOWLEDGE OF SELF CARE AFTER HOSPITALIZATION Description Pt. Will be able to verbalized how to manage his medications and post op. Care at home   08/08/2018 1420 by Isabel Freese N, RN Outcome: Progressing 08/08/2018 1359 by Juelz Whittenberg N, RN Outcome: Progressing Patient participates in ADLs w/o difficulty.

## 2018-08-09 ENCOUNTER — Inpatient Hospital Stay (HOSPITAL_COMMUNITY): Payer: Medicare Other | Admitting: Physical Therapy

## 2018-08-09 NOTE — Progress Notes (Signed)
[place] PHYSICAL MEDICINE & REHABILITATION PROGRESS NOTE  Subjective/Complaints: Patient seen laying in bed this morning.  He states he slept well overnight.  He is in better spirits today.  He was seen by nephro yesterday, notes reviewed.  ROS: Denies CP, shortness of breath, nausea, vomiting, diarrhea.  Objective: Vital Signs: Blood pressure 109/66, pulse 84, temperature 99.2 F (37.3 C), temperature source Oral, resp. rate 20, height 5\' 10"  (1.778 m), weight 88.3 kg, SpO2 93 %. No results found. Recent Labs    08/07/18 0608 08/08/18 1344  WBC 5.5 5.2  HGB 10.8* 10.3*  HCT 32.6* 31.8*  PLT 121* 114*   Recent Labs    08/06/18 1416 08/08/18 1344  NA 134* 132*  K 4.1 4.2  CL 94* 93*  CO2 23 25  GLUCOSE 110* 119*  BUN 54* 50*  CREATININE 8.79* 8.20*  CALCIUM 9.1 9.4    Physical Exam: BP 109/66 (BP Location: Left Arm)   Pulse 84   Temp 99.2 F (37.3 C) (Oral)   Resp 20   Ht 5\' 10"  (1.778 m)   Wt 88.3 kg   SpO2 93%   BMI 27.93 kg/m  Constitutional: No distress . Vital signs reviewed. HENT: Cephalic.  Atraumatic. Eyes: EOMI.  No discharge. Cardiovascular: No JVD. Respiratory: Normal effort. GI: Non-distended. Musculoskeletal: Left foot >right lower extremity edema, unchanged Neurological:  Alert and oriented Follows commands.  Motor: Right lower extremity: 4/5 proximal distal, unchanged Left lower extremity: Hip flexion, knee extension 2+/5, ankle dorsiflexion 4-/5 Skin: Skin iswarm. He isnot diaphoretic. Femoropopliteal bypass site C/D/I  Circular appearing erythema with fluid at distal end of bypass site, stable Psychiatric: He has a normal mood and affect. Hisbehavior is normal.Judgmentand thought contentnormal.   Assessment/Plan: 1. Functional deficits secondary to debility which require 3+ hours per day of interdisciplinary therapy in a comprehensive inpatient rehab setting.  Physiatrist is providing close team supervision and 24 hour  management of active medical problems listed below.  Physiatrist and rehab team continue to assess barriers to discharge/monitor patient progress toward functional and medical goals  Care Tool:  Bathing    Body parts bathed by patient: Right arm, Left arm, Chest, Abdomen, Front perineal area, Left upper leg, Right upper leg, Face, Buttocks   Body parts bathed by helper: Right lower leg, Left lower leg Body parts n/a: Left lower leg   Bathing assist Assist Level: Contact Guard/Touching assist     Upper Body Dressing/Undressing Upper body dressing   What is the patient wearing?: Pull over shirt    Upper body assist Assist Level: Independent    Lower Body Dressing/Undressing Lower body dressing      What is the patient wearing?: Underwear/pull up, Pants     Lower body assist Assist for lower body dressing: Contact Guard/Touching assist     Toileting Toileting    Toileting assist Assist for toileting: Minimal Assistance - Patient > 75%     Transfers Chair/bed transfer  Transfers assist     Chair/bed transfer assist level: Contact Guard/Touching assist     Locomotion Ambulation   Ambulation assist   Ambulation activity did not occur: Safety/medical concerns  Assist level: Supervision/Verbal cueing Assistive device: Walker-rolling Max distance: 150'   Walk 10 feet activity   Assist  Walk 10 feet activity did not occur: Safety/medical concerns  Assist level: Supervision/Verbal cueing Assistive device: Walker-rolling   Walk 50 feet activity   Assist Walk 50 feet with 2 turns activity did not occur: Safety/medical concerns  Assist level: Supervision/Verbal cueing Assistive device: Walker-rolling    Walk 150 feet activity   Assist Walk 150 feet activity did not occur: Safety/medical concerns  Assist level: Dependent - Patient 0% Assistive device: Walker-rolling    Walk 10 feet on uneven surface  activity   Assist Walk 10 feet on uneven  surfaces activity did not occur: Safety/medical concerns         Wheelchair     Assist Will patient use wheelchair at discharge?: Yes Type of Wheelchair: Manual    Wheelchair assist level: Supervision/Verbal cueing Max wheelchair distance: 150'    Wheelchair 50 feet with 2 turns activity    Assist        Assist Level: Supervision/Verbal cueing   Wheelchair 150 feet activity     Assist     Assist Level: Supervision/Verbal cueing      Medical Problem List and Plan: 1.Debilitysecondary to PAD status post left common femoral to below-knee popliteal artery bypass 2/68/3419 complicated by non-STEMI  Continue CIR 2. Antithrombotics: -DVT/anticoagulation:Subcutaneous heparin -antiplatelet therapy: Aspirin 81 mg daily, Plavix 75 mg daily 3. Pain Management:Neurontin 300 mg every evening, oxycodone as needed 4. Mood:Provide emotional support -antipsychotic agents: N/A 5. Neuropsych: This patientiscapable of making decisions on hisown behalf. 6. Skin/Wound Care:Routine skin checks. Local skin care, protection to necrotic areas along toes of left foot.   Will need to monitor closely for left lower extremity.  Monitor circular lesion with fluid as well- no further changes at present per vascular-follow-up as outpatient.  Eucerin cream added, NOT not be applied near nectoric areas 7. Fluids/Electrolytes/Nutrition: Routine in and outs 8. Acute blood loss anemia. Continue Aranesp   Hemoglobin 10.3 on 5/30  Continue to monitor 9.Hypotension/aortic stenosis.   Blood pressures low, but improved and asymptomatic on 5/30 10. End-stage renal disease. Continue hemodialysis as directed.  Recs per nephro  11. Hyperlipidemia. Lipitor 12. Chronic combined diastolic systolic congestive heart failure with EF of 35-40% and severe left ear. Monitor for any signs of fluid overload  Daily weights ordered [facility] Weights   08/08/18 1325  08/08/18 1645 08/09/18 0549  Weight: 88 kg 86.5 kg 88.3 kg   Stable on 5/30 13.Constipation. Laxative assistance 14.  Thrombocytopenia-appears to be chronic  Platelets 114 on 5/30  Continue to monitor 15.  Severe constipation  Bowel meds increased on 5/21  Will consider further increase in medications if necessary 16.  Severe CAD  Single-vessel disease total occlusion of RCA  Medical management 17.  Leukocytosis: Resolved  WBCs 5.5 on 5/28  Afebrile  Continue to monitor 18. GERD  Cont Protonix  TUMS prn started on 5/26  LOS: 12 days A FACE TO FACE EVALUATION WAS PERFORMED  Barri Neidlinger Lorie Phenix 08/09/2018, 12:52 PM

## 2018-08-09 NOTE — Progress Notes (Signed)
Physical Therapy Session Note  Patient Details  Name: Jose Garrett MRN: [id number] Date of Birth: June 15, 1950  Today's Date: 08/09/2018 PT Individual Time: 1300-1329 PT Individual Time Calculation (min): 29 min   Short Term Goals: Week 2:  PT Short Term Goal 1 (Week 2): =LTGs due to ELOS  Skilled Therapeutic Interventions/Progress Updates:      Therapy Documentation Precautions:  Precautions Precautions: Fall Restrictions Weight Bearing Restrictions: No  Treatment: Pt seated in wheelchair on arrival. Agreeable to PT at this time.  Pt propelled himself from room to gym with stairs with supervision, no rest breaks needed. Min guard assist to stand from wheelchair to stair rail and supervision to sit back in wheelchair.  Using bottom step pt performed forward step ups x 10 reps each leg, seated rest break between each leg. Pt then propelled himself back to room without rest break at supervision level. Min guard assist to stand and min assist to transfer to bed with pt taking 3-4 steps, min guard to sit down. Supervision to return to supine. Once in bed pt performed ex program on bed- ankle pumps x 20 reps, quad sets with 5 sec holds x 10 reps and glut sets with 5 sec holds x 10 reps.  Pt left in bed with HOB 30 degrees, bed alarm on and all needs in reach.    Therapy/Group: Individual Therapy  Willow Ora, PTA 08/09/2018, 7:55 AM

## 2018-08-10 ENCOUNTER — Inpatient Hospital Stay (HOSPITAL_COMMUNITY): Payer: Medicare Other | Admitting: Physical Therapy

## 2018-08-10 MED ORDER — SIMETHICONE 80 MG PO CHEW: 80.0000 mg | CHEWABLE_TABLET | Freq: Four times a day (QID) | ORAL | Status: DC | PRN

## 2018-08-10 MED ORDER — SODIUM CHLORIDE 0.9 % IV SOLN
125.0000 mg | INTRAVENOUS | Status: DC
  Filled 2018-08-10 (×4): qty 10

## 2018-08-10 MED ORDER — ONDANSETRON HCL 4 MG PO TABS
2.0000 mg | ORAL_TABLET | Freq: Four times a day (QID) | ORAL | Status: DC | PRN
  Administered 2018-08-10: 2 mg via ORAL
  Filled 2018-08-10: qty 1

## 2018-08-10 MED ORDER — CHLORHEXIDINE GLUCONATE CLOTH 2 % EX PADS: 6.0000 | MEDICATED_PAD | Freq: Every day | CUTANEOUS | Status: DC

## 2018-08-10 MED ORDER — MIDODRINE HCL 5 MG PO TABS
10.0000 mg | ORAL_TABLET | ORAL | Status: DC
  Administered 2018-08-13 (×2): 10 mg via ORAL
  Filled 2018-08-10 (×3): qty 2

## 2018-08-10 NOTE — Progress Notes (Signed)
[facility] Progress Note   Subjective: doing well,  1 L off yest on HD w BP drop into 80's  Objective Vitals:   08/09/18 1348 08/09/18 1931 08/10/18 0311 08/10/18 0500  BP: (!) 96/58 108/65 114/66   Pulse: 81 87 89   Resp: 16 18 18    Temp: 98.3 F (36.8 C) 98.3 F (36.8 C) 97.8 F (36.6 C)   TempSrc: Oral Oral Axillary   SpO2: 100% 96% 97%   Weight:    85.1 kg  Height:       Physical Exam General: Elderly man, NAD Heart: RRR; 2/6 murmur Lungs: CTA anteriorly Abdomen: soft, non-tender Extremities: 1-2 + bilat LE edema Dialysis Access:  AVF + thrill   home meds:   - aspirin 81/ clopidogrel 75 hs/ atorvastatin 80   - gabapentin 300mg  am+ 900mg  hs/ oxycodone IR prn/ oxycontin 10 bid   - sucroferric oxyhydroxide 2-3 tabs ac tid   - prn's/ vitamins/ supplements    [facility] MWF 4h 400/800 86kg 2/2 P4R AVF Hep 8000 Sensipar 60 TIW Calcitriol 1.25 TIW Mircera 50 q 2 weeks (last 4/29)  Assessment/Plan: 1. ESRD: on HD MWF. HD Monday.   2.  Hyperkalemia: resolved  3. PAD with LLE critical limb ischemia: S/pL fem-pop bypass  4. Volume excess - vol coming down,down to dry wt ,  Still edematous in legs, Keep SBP > 80 w/ UF on HD  4. Ischemic cardiomyopathy / severe AS / EF 35-40% - chronic hypotension related to these issues.  Added midodrine 10 mg pre HD mwf.   5. Anemia (d/t ESRD and ABLA): On aranesp and nulecit - increased both doses. SP 200 ug darbe here on 5/20 and 5/27. SP 5u prbc's total on both admissions including rehab.  Hb now stable 10-11 range. B12, folate ok.  Tsat 23%, getting ferric gluconate 4 doses ordered w HD.  Stool guiac negative.  6. Secondary hyperparathyroidism: Continue home velphoro, Sensipar, calcitriol   7. Elevated troponin: S/p cath with severe single vessel CAD chronic total chronic occlusion of RCA. Medical therapy recommended. No targets for PCI.   [name]  MD 08/01/2018, 1:21 PM  Additional  Objective Labs: Basic Metabolic Panel: Recent Labs  Lab 08/04/18 1513 08/06/18 1416 08/08/18 1344  NA 135 134* 132*  K 4.6 4.1 4.2  CL 96* 94* 93*  CO2 21* 23 25  GLUCOSE 118* 110* 119*  BUN 71* 54* 50*  CREATININE 10.20* 8.79* 8.20*  CALCIUM 8.9 9.1 9.4  PHOS 4.9* 5.2* 4.7*   Liver Function Tests: Recent Labs  Lab 08/04/18 1513 08/06/18 1416 08/08/18 1344  ALBUMIN 2.9* 2.8* 3.1*   CBC: Recent Labs  Lab 08/04/18 1110 08/04/18 1512 08/07/18 0608 08/08/18 1344  WBC 11.7* 11.6* 5.5 5.2  NEUTROABS 10.3*  --  3.7  --   HGB 11.6* 10.8* 10.8* 10.3*  HCT 35.4* 33.4* 32.6* 31.8*  MCV 98.6 99.4 97.3 98.1  PLT 121* 128* 121* 114*   Medications: . ferric gluconate (FERRLECIT/NULECIT) IV 125 mg (08/06/18 1546)   . aspirin EC  81 mg Oral Daily  . atorvastatin  80 mg Oral Q supper  . calcitRIOL  1.25 mcg Oral Q M,W,F-HD  . Chlorhexidine Gluconate Cloth  6 each Topical Q0600  . cinacalcet  60 mg Oral Q M,W,F-HD  . clopidogrel  75 mg Oral Q supper  . darbepoetin (ARANESP) injection - DIALYSIS  200 mcg Intravenous Q Wed-HD  . docusate sodium  100 mg Oral TID  .  gabapentin  300 mg Oral q morning - 10a  . heparin  5,000 Units Subcutaneous Q8H  . hydrocerin   Topical BID  . loratadine  10 mg Oral Daily  . pantoprazole  40 mg Oral Daily  . polyethylene glycol  17 g Oral BID  . sucroferric oxyhydroxide  1,000 mg Oral TID WC  . vitamin B-12  1,000 mcg Oral Daily

## 2018-08-10 NOTE — Progress Notes (Signed)
While in bed, pt reported suddenly feeling nauseated, sat up, and had a small amount of yellow emesis. Earlier in shift, @ 2100, pt reported felling gassy and was given Tums as an intervention. After emesis episode, pt felt somewhat better but still queasy.   On call doctor was notified and PRN order for Zofran was placed. Pt is doing fine at the moment, will continue to monitor.

## 2018-08-10 NOTE — Progress Notes (Signed)
[place] PHYSICAL MEDICINE & REHABILITATION PROGRESS NOTE  Subjective/Complaints: Patient seen laying in bed this morning.  Overnight called by nursing regarding nausea and vomiting.  Patient states that he slept fairly as a result of that but it resolved with medications.?  Holiday representative.  Discuss simethicone with nursing.  ROS: Denies CP, shortness of breath, nausea, vomiting, diarrhea.  Objective: Vital Signs: Blood pressure 114/66, pulse 89, temperature 97.8 F (36.6 C), temperature source Axillary, resp. rate 18, height 5\' 10"  (1.778 m), weight 85.1 kg, SpO2 97 %. No results found. Recent Labs    08/08/18 1344  WBC 5.2  HGB 10.3*  HCT 31.8*  PLT 114*   Recent Labs    08/08/18 1344  NA 132*  K 4.2  CL 93*  CO2 25  GLUCOSE 119*  BUN 50*  CREATININE 8.20*  CALCIUM 9.4    Physical Exam: BP 114/66 (BP Location: Left Arm)   Pulse 89   Temp 97.8 F (36.6 C) (Axillary)   Resp 18   Ht 5\' 10"  (1.778 m)   Wt 85.1 kg   SpO2 97%   BMI 26.92 kg/m  Constitutional: No distress . Vital signs reviewed. HENT: Normocephalic.  Atraumatic.. Eyes: EOMI.  No discharge. Cardiovascular: No JVD. Respiratory: Normal effort. GI: Non-distended. Musculoskeletal: Left foot >right lower extremity edema, stable Neurological:  Alert and oriented Follows commands.  Motor: Right lower extremity: 4/5 proximal distal, stable Left lower extremity: Hip flexion, knee extension 2+/5, ankle dorsiflexion 4-/5, stable Skin: Skin iswarm. He isnot diaphoretic. Femoropopliteal bypass site C/D/I  Circular appearing erythema with fluid at distal end of bypass site, unchanged Psychiatric: He has a normal mood and affect. Hisbehavior is normal.Judgmentand thought contentnormal.   Assessment/Plan: 1. Functional deficits secondary to debility which require 3+ hours per day of interdisciplinary therapy in a comprehensive inpatient rehab setting.  Physiatrist is providing close team supervision and  24 hour management of active medical problems listed below.  Physiatrist and rehab team continue to assess barriers to discharge/monitor patient progress toward functional and medical goals  Care Tool:  Bathing    Body parts bathed by patient: Right arm, Left arm, Chest, Abdomen, Front perineal area, Left upper leg, Right upper leg, Face, Buttocks   Body parts bathed by helper: Right lower leg, Left lower leg Body parts n/a: Left lower leg   Bathing assist Assist Level: Contact Guard/Touching assist     Upper Body Dressing/Undressing Upper body dressing   What is the patient wearing?: Pull over shirt    Upper body assist Assist Level: Independent    Lower Body Dressing/Undressing Lower body dressing      What is the patient wearing?: Underwear/pull up, Pants     Lower body assist Assist for lower body dressing: Contact Guard/Touching assist     Toileting Toileting    Toileting assist Assist for toileting: Minimal Assistance - Patient > 75%     Transfers Chair/bed transfer  Transfers assist     Chair/bed transfer assist level: Contact Guard/Touching assist     Locomotion Ambulation   Ambulation assist   Ambulation activity did not occur: Safety/medical concerns  Assist level: Supervision/Verbal cueing Assistive device: Walker-rolling Max distance: 150'   Walk 10 feet activity   Assist  Walk 10 feet activity did not occur: Safety/medical concerns  Assist level: Supervision/Verbal cueing Assistive device: Walker-rolling   Walk 50 feet activity   Assist Walk 50 feet with 2 turns activity did not occur: Safety/medical concerns  Assist level: Supervision/Verbal cueing Assistive  device: Walker-rolling    Walk 150 feet activity   Assist Walk 150 feet activity did not occur: Safety/medical concerns  Assist level: Dependent - Patient 0% Assistive device: Walker-rolling    Walk 10 feet on uneven surface  activity   Assist Walk 10 feet on  uneven surfaces activity did not occur: Safety/medical concerns         Wheelchair     Assist Will patient use wheelchair at discharge?: Yes Type of Wheelchair: Manual    Wheelchair assist level: Supervision/Verbal cueing Max wheelchair distance: 150'    Wheelchair 50 feet with 2 turns activity    Assist        Assist Level: Supervision/Verbal cueing   Wheelchair 150 feet activity     Assist     Assist Level: Supervision/Verbal cueing      Medical Problem List and Plan: 1.Debilitysecondary to PAD status post left common femoral to below-knee popliteal artery bypass 3/72/9021 complicated by non-STEMI  Continue CIR 2. Antithrombotics: -DVT/anticoagulation:Subcutaneous heparin -antiplatelet therapy: Aspirin 81 mg daily, Plavix 75 mg daily 3. Pain Management:Neurontin 300 mg every evening, oxycodone as needed 4. Mood:Provide emotional support -antipsychotic agents: N/A 5. Neuropsych: This patientiscapable of making decisions on hisown behalf. 6. Skin/Wound Care:Routine skin checks. Local skin care, protection to necrotic areas along toes of left foot.   Will need to monitor closely for left lower extremity.  Monitor circular lesion with fluid as well- no further changes at present per vascular-follow-up as outpatient.  Eucerin cream added, NOT not be applied near nectoric areas 7. Fluids/Electrolytes/Nutrition: Routine in and outs 8. Acute blood loss anemia. Continue Aranesp Nulecit per nephro  Hemoglobin 10.3 on 5/29  Labs with HD  Continue to monitor 9.Hypotension/aortic stenosis.   Blood pressures low, but improved and asymptomatic on 5/31 10. End-stage renal disease. Continue hemodialysis as directed.  Recs per nephro  11. Hyperlipidemia. Lipitor 12. Chronic combined diastolic systolic congestive heart failure with EF of 35-40% and severe left ear. Monitor for any signs of fluid overload  Daily weights  ordered Filed Weights   08/08/18 1645 08/09/18 0549 08/10/18 0500  Weight: 86.5 kg 88.3 kg 85.1 kg   ?  Reliability on 5/31 13.Constipation. Laxative assistance 14.  Thrombocytopenia-appears to be chronic  Platelets 114 on 5/29  Continue to monitor 15.  Severe constipation  Bowel meds increased on 5/21  Will consider further increase in medications if necessary 16.  Severe CAD  Single-vessel disease total occlusion of RCA  Medical management 17.  Leukocytosis: Resolved  WBCs 5.5 on 5/28  Afebrile  Continue to monitor 18. GERD/gas  Cont Protonix  TUMS prn started on 5/26  Simethicone PRN ordered on 5/31  LOS: 13 days A FACE TO FACE EVALUATION WAS PERFORMED  Ankit Lorie Phenix 08/10/2018, 12:21 PM

## 2018-08-10 NOTE — Progress Notes (Addendum)
Physical Therapy Session Note  Patient Details  Name: CORRIN HINGLE MRN: [id number] Date of Birth: 1950-05-11  Today's Date: 08/10/2018 PT Individual Time: 0903-1015 PT Individual Time Calculation (min): 72 min   Short Term Goals: Week 2:  PT Short Term Goal 1 (Week 2): =LTGs due to ELOS  Skilled Therapeutic Interventions/Progress Updates: Pt presented in bed agreeable to therapy. Per pt had GI issues last night and feeling more fatigued this am. Pt performed bed mobility  With supervision and use of bed features. Performed ambulatory transfer with RW to w/c CGA and propelled self using BUE to rehab gym. Pt participated in standing tolerance activities including peg board standing on Airex, horseshoes standing on Airex, and horseshoes with single LE on 4in step. Pt able to self correct while standing on airex however with x1 LOB to L with minA for correction with RLE on 4in step. Pt then participated in fall prevention HEP and provided sheet. Pt was able to complete x 10 reps of all activities without break. After seated rest pt ambulated to day room and participated in NuStep L2 x 5 min for endurance and global conditioning. Due to fatigue pt transported back to room and agreeable to remain in w/c after session. Pt left with seat alarm on, call bell within reach and needs met.      Therapy Documentation Precautions:  Precautions Precautions: Fall Restrictions Weight Bearing Restrictions: No    Therapy/Group: Individual Therapy  Syrai Gladwin  Tasheema Perrone, PTA  08/10/2018, 3:35 PM

## 2018-08-11 ENCOUNTER — Telehealth: Payer: Self-pay | Admitting: Cardiovascular Disease

## 2018-08-11 ENCOUNTER — Inpatient Hospital Stay (HOSPITAL_COMMUNITY): Payer: Medicare Other | Admitting: Physical Therapy

## 2018-08-11 ENCOUNTER — Inpatient Hospital Stay (HOSPITAL_COMMUNITY): Payer: Medicare Other | Admitting: Occupational Therapy

## 2018-08-11 DIAGNOSIS — I129 Hypertensive chronic kidney disease with stage 1 through stage 4 chronic kidney disease, or unspecified chronic kidney disease: Secondary | ICD-10-CM | POA: Diagnosis not present

## 2018-08-11 DIAGNOSIS — N186 End stage renal disease: Secondary | ICD-10-CM | POA: Diagnosis not present

## 2018-08-11 DIAGNOSIS — Z992 Dependence on renal dialysis: Secondary | ICD-10-CM | POA: Diagnosis not present

## 2018-08-11 MED ORDER — RENA-VITE PO TABS
1.0000 | ORAL_TABLET | Freq: Every day | ORAL | Status: DC
  Administered 2018-08-11 – 2018-08-12 (×2): 1 via ORAL
  Filled 2018-08-11 (×2): qty 1

## 2018-08-11 MED ORDER — PRO-STAT SUGAR FREE PO LIQD
30.0000 mL | Freq: Two times a day (BID) | ORAL | Status: DC
  Administered 2018-08-11 – 2018-08-12 (×2): 30 mL via ORAL
  Filled 2018-08-11 (×3): qty 30

## 2018-08-11 NOTE — Procedures (Signed)
Patient seen on Hemodialysis. BP (P) 102/60   Pulse (P) 75   Temp (P) 98.6 F (37 C) (Oral)   Resp (P) 18   Ht 5\' 10"  (1.778 m)   Wt (P) 88.6 kg Comment: standing weight  SpO2 (P) 96%   BMI (P) 28.03 kg/m   QB 400, UF goal 3L Tolerating treatment without complaints at this time.   Elmarie Shiley MD [facility]. Office # [phone] Pager # [phone] 3:39 PM

## 2018-08-11 NOTE — Progress Notes (Signed)
Physical Therapy Discharge Summary  Patient Details  Name: Jose Garrett MRN: [id number] Date of Birth: April 28, 1950  Today's Date: 08/12/2018 PT Individual Time: 1445-1530 PT Individual Time Calculation (min): 45 min   Pt received in w/c, daughter and wife present for caregiver education. Instructed both on providing supervision level assist for upright mobility including gait, curb negotiation w/ RW, and car transfer. Pt able to perform bed mobility, sit<>stands, and stand pivot transfers at mod/i level using RW. Educated both on endurance deficits, limitations w/ community gait, use of w/c for energy conservation, HEP, and LLE swelling/edema management w/ positioning and modalities. Both verbalized understanding and in agreement. Ended session in supine, ice applied to L groin and L incision site. Increased L groin pain today (8/10 on faces scale), made PA-C aware.   Patient has met 10 of 10 long term goals due to improved activity tolerance, improved balance, improved postural control, increased strength, increased range of motion, decreased pain, ability to compensate for deficits and functional use of  left lower extremity.  Patient to discharge at an ambulatory level Supervision.   Patient's care partner is independent to provide the necessary physical assistance at discharge.  Reasons goals not met: n/a  Recommendation:  Patient will benefit from ongoing skilled PT services in home health setting to continue to advance safe functional mobility, address ongoing impairments in LLE strength and ROM, global endurance and strengthening, and functional balance, and minimize fall risk.  Equipment: w/c and RW  Reasons for discharge: treatment goals met and discharge from hospital  Patient/family agrees with progress made and goals achieved: Yes  PT Discharge Precautions/Restrictions Precautions Precautions: Fall Restrictions Weight Bearing Restrictions: No Vision/Perception   Perception Perception: Within Functional Limits Praxis Praxis: Intact  Cognition Overall Cognitive Status: Within Functional Limits for tasks assessed Arousal/Alertness: Awake/alert Orientation Level: Oriented X4 Memory: Appears intact Awareness: Appears intact Problem Solving: Impaired(mild functional problem solving deficits noted, gradually improving during this hospital stay) Safety/Judgment: Appears intact Sensation Sensation Light Touch: Impaired by gross assessment(diminished BLEs, LLE>RLE, history of peripheral neuropathy at baseline) Coordination Gross Motor Movements are Fluid and Coordinated: No Fine Motor Movements are Fluid and Coordinated: Yes Coordination and Movement Description: limited 2/2 guarding and pain in LLE w/ all movements Motor  Motor Motor: Within Functional Limits Motor - Discharge Observations: generalized weakness  Mobility Bed Mobility Bed Mobility: Rolling Right;Supine to Sit;Rolling Left;Sit to Supine Rolling Right: Independent Rolling Left: Independent Supine to Sit: Independent Sit to Supine: Independent Transfers Transfers: Sit to Stand;Stand to Sit;Stand Pivot Transfers Sit to Stand: Independent with assistive device Stand to Sit: Independent with assistive device Stand Pivot Transfers: Independent with assistive device Transfer (Assistive device): Rolling walker Locomotion  Gait Ambulation: Yes Gait Assistance: Supervision/Verbal cueing Gait Distance (Feet): 150 Feet Assistive device: Rolling walker Gait Assistance Details: Verbal cues for precautions/safety Gait Gait: Yes Gait Pattern: Impaired Gait Pattern: Decreased step length - left;Decreased hip/knee flexion - left;Decreased dorsiflexion - left;Shuffle Gait velocity: decreased Stairs / Additional Locomotion Stairs: Yes Stairs Assistance: Supervision/Verbal cueing Stair Management Technique: Backwards;With walker Number of Stairs: 1 Height of Stairs: 4 Curb:  Supervision/Verbal cueing Wheelchair Mobility Wheelchair Mobility: Yes Wheelchair Assistance: Independent with Camera operator: Both upper extremities Wheelchair Parts Management: Supervision/cueing Distance: 150'  Trunk/Postural Assessment  Cervical Assessment Cervical Assessment: Within Functional Limits Thoracic Assessment Thoracic Assessment: Within Functional Limits Lumbar Assessment Lumbar Assessment: Within Functional Limits Postural Control Postural Control: Within Functional Limits  Balance Balance Balance Assessed: Yes Static Sitting Balance Static Sitting -  Balance Support: No upper extremity supported Static Sitting - Level of Assistance: 7: Independent Dynamic Sitting Balance Dynamic Sitting - Balance Support: No upper extremity supported Dynamic Sitting - Level of Assistance: 7: Independent Static Standing Balance Static Standing - Balance Support: During functional activity;No upper extremity supported Static Standing - Level of Assistance: 5: Stand by assistance Dynamic Standing Balance Dynamic Standing - Balance Support: During functional activity;No upper extremity supported Dynamic Standing - Level of Assistance: 5: Stand by assistance Extremity Assessment  RLE Assessment RLE Assessment: Exceptions to [facility] Passive Range of Motion (PROM) Comments: WFL, increased stiffness globally  General Strength Comments: globally 4/5  LLE Assessment Passive Range of Motion (PROM) Comments: Limited globally 2/2 pain and swelling, tightness especially noted in hamstirng and gastroc General Strength Comments: globally 3/5     [name] 08/11/2018, 11:53 AM

## 2018-08-11 NOTE — Telephone Encounter (Signed)
LVM for patient to call and schedule a 6 week followup in office with Dr. Fletcher Anon post procedure.

## 2018-08-11 NOTE — Progress Notes (Signed)
[place] PHYSICAL MEDICINE & REHABILITATION PROGRESS NOTE  Subjective/Complaints: Patient seen laying in bed this morning.  He states he slept well overnight.  He denies any repeat episodes of nausea.  He was seen by nephro yesterday.  Notes reviewed.  ROS: Denies CP, shortness of breath, nausea, vomiting, diarrhea.  Objective: Vital Signs: Blood pressure 93/61, pulse 73, temperature 98.4 F (36.9 C), resp. rate 16, height 5\' 10"  (1.778 m), weight 89.4 kg, SpO2 94 %. No results found. Recent Labs    08/08/18 1344  WBC 5.2  HGB 10.3*  HCT 31.8*  PLT 114*   Recent Labs    08/08/18 1344  NA 132*  K 4.2  CL 93*  CO2 25  GLUCOSE 119*  BUN 50*  CREATININE 8.20*  CALCIUM 9.4    Physical Exam: BP 93/61 (BP Location: Left Arm)   Pulse 73   Temp 98.4 F (36.9 C)   Resp 16   Ht 5\' 10"  (1.778 m)   Wt 89.4 kg   SpO2 94%   BMI 28.28 kg/m  Constitutional: No distress . Vital signs reviewed. HENT: Normocephalic.  Atraumatic. Eyes: EOMI.  No discharge. Cardiovascular: No JVD. Respiratory: Normal effort. GI: Non-distended. Musculoskeletal: Left foot >right lower extremity edema, unchanged Neurological:  Alert and oriented Follows commands.  Motor: Right lower extremity: 4/5 proximal distal, unchanged Left lower extremity: Hip flexion, knee extension 2+/5, ankle dorsiflexion 4-/5, unchanged Skin: Skin iswarm. He isnot diaphoretic. Femoropopliteal bypass site C/D/I  Circular appearing erythema with fluid at distal end of bypass site, unchanged Psychiatric: He has a normal mood and affect. Hisbehavior is normal.Judgmentand thought contentnormal.   Assessment/Plan: 1. Functional deficits secondary to debility which require 3+ hours per day of interdisciplinary therapy in a comprehensive inpatient rehab setting.  Physiatrist is providing close team supervision and 24 hour management of active medical problems listed below.  Physiatrist and rehab team continue to  assess barriers to discharge/monitor patient progress toward functional and medical goals  Care Tool:  Bathing    Body parts bathed by patient: Right arm, Left arm, Chest, Abdomen, Front perineal area, Left upper leg, Right upper leg, Face, Buttocks   Body parts bathed by helper: Right lower leg, Left lower leg Body parts n/a: Left lower leg   Bathing assist Assist Level: Contact Guard/Touching assist     Upper Body Dressing/Undressing Upper body dressing   What is the patient wearing?: Pull over shirt    Upper body assist Assist Level: Independent    Lower Body Dressing/Undressing Lower body dressing      What is the patient wearing?: Underwear/pull up, Pants     Lower body assist Assist for lower body dressing: Contact Guard/Touching assist     Toileting Toileting    Toileting assist Assist for toileting: Minimal Assistance - Patient > 75%     Transfers Chair/bed transfer  Transfers assist     Chair/bed transfer assist level: Contact Guard/Touching assist     Locomotion Ambulation   Ambulation assist   Ambulation activity did not occur: Safety/medical concerns  Assist level: Supervision/Verbal cueing Assistive device: Walker-rolling Max distance: 150'   Walk 10 feet activity   Assist  Walk 10 feet activity did not occur: Safety/medical concerns  Assist level: Supervision/Verbal cueing Assistive device: Walker-rolling   Walk 50 feet activity   Assist Walk 50 feet with 2 turns activity did not occur: Safety/medical concerns  Assist level: Supervision/Verbal cueing Assistive device: Walker-rolling    Walk 150 feet activity   Assist  Walk 150 feet activity did not occur: Safety/medical concerns  Assist level: Dependent - Patient 0% Assistive device: Walker-rolling    Walk 10 feet on uneven surface  activity   Assist Walk 10 feet on uneven surfaces activity did not occur: Safety/medical concerns          Wheelchair     Assist Will patient use wheelchair at discharge?: Yes Type of Wheelchair: Manual    Wheelchair assist level: Supervision/Verbal cueing Max wheelchair distance: 150'    Wheelchair 50 feet with 2 turns activity    Assist        Assist Level: Supervision/Verbal cueing   Wheelchair 150 feet activity     Assist     Assist Level: Supervision/Verbal cueing      Medical Problem List and Plan: 1.Debilitysecondary to PAD status post left common femoral to below-knee popliteal artery bypass 5/46/5035 complicated by non-STEMI  Continue CIR 2. Antithrombotics: -DVT/anticoagulation:Subcutaneous heparin -antiplatelet therapy: Aspirin 81 mg daily, Plavix 75 mg daily 3. Pain Management:Neurontin 300 mg every evening, oxycodone as needed 4. Mood:Provide emotional support -antipsychotic agents: N/A 5. Neuropsych: This patientiscapable of making decisions on hisown behalf. 6. Skin/Wound Care:Routine skin checks. Local skin care, protection to necrotic areas along toes of left foot.   Will need to monitor closely for left lower extremity.  Monitor circular lesion with fluid as well- no further changes at present per vascular-follow-up as outpatient.  Eucerin cream added, NOT not be applied near nectoric areas 7. Fluids/Electrolytes/Nutrition: Routine in and outs 8. Acute blood loss anemia. Continue Aranesp Nulecit per nephro  Hemoglobin 10.3 on 5/29  Labs with HD  Continue to monitor 9.Hypotension/aortic stenosis.   Blood pressures low, but asymptomatic on 6/1 10. End-stage renal disease. Continue hemodialysis as directed.  Recs per nephro  11. Hyperlipidemia. Lipitor 12. Chronic combined diastolic systolic congestive heart failure with EF of 35-40% and severe left ear. Monitor for any signs of fluid overload  Daily weights ordered Filed Weights   08/09/18 0549 08/10/18 0500 08/11/18 0442  Weight: 88.3 kg 85.1  kg 89.4 kg   ?  Reliability on 6/1 13.Constipation. Laxative assistance 14.  Thrombocytopenia-appears to be chronic  Platelets 114 on 5/29  Labs with HD  Continue to monitor 15.  Severe constipation  Bowel meds increased on 5/21  Will consider further increase in medications if necessary 16.  Severe CAD  Single-vessel disease total occlusion of RCA  Medical management 17.  Leukocytosis: Resolved  Afebrile  Continue to monitor 18. GERD/gas  Cont Protonix  TUMS prn started on 5/26  Simethicone PRN ordered on 5/31  LOS: 14 days A FACE TO FACE EVALUATION WAS PERFORMED  Santita Hunsberger Lorie Phenix 08/11/2018, 9:11 AM

## 2018-08-11 NOTE — Progress Notes (Signed)
Physical Therapy Session Note  Patient Details  Name: Jose Garrett MRN: [id number] Date of Birth: 1950/09/06  Today's Date: 08/11/2018 PT Individual Time: 0902-1017 AND 1100-1140 PT Individual Time Calculation (min): 75 min AND 40 min  Short Term Goals: Week 2:  PT Short Term Goal 1 (Week 2): =LTGs due to ELOS  Skilled Therapeutic Interventions/Progress Updates:   Session 1:  Pt in w/c and agreeable to therapy, pain 5/10 in LLE. Pt self-propelled w/c to therapy gym to work on endurance. Sit<>stands on foam surface w/ RW to stabilize in stance, supervision. Stood to perform puzzle while on foam surface for 5 min x2 w/ close supervision and intermittent UE support on RW/table. Puzzle served as cognitive challenge, occasional verbal cues for problem solving. Switched to red foam wedge to add B gastroc stretch in standing, CGA while on wedge for balance while tossing horseshoes. Overall emphasis on activation of ankle balance strategies. Worked on LLE ROM/strengthening during seated rest breaks. Passive hamstring and gastroc stretch 30 sec x5, LAQs 3x10, and knee marches 3x10. Ambulated 150' to dayroom w/ supervision using RW. NuStep 8 min @ level 3 w/ all extremities to work on global strengthening/endurance and gentle ROM of LLE. Pt self-propelled w/c back t o room and ended session in supine, all needs in reach. LLE elevated to above heart level for swelling management.   Session 2:  Pt in supine and agreeable to therapy, pain remains 5/10. Practiced bed mobility in preparation for d/c to daughters house, set-up bed to simulate home environment and what side of the bed he would be on. Performed supine<>sit w/o physical assist. Performed supine exercises, pt able to remember and demonstrated 2/3 exercises previously instructed to perform outside of therapies. See listed detailed below. Reinforced importance of performing LLE exercises outside of therapy for blood flow, swelling management, and pain  management. Pt verbalized understanding. Ongoing reminders to elevate LLE for swelling management in between therapy sessions. Provided w/ handout for foot care and swelling management. Ended session sitting EOB independently in preparation for lunch. All needs in reach.   LLE ROM/strengthening exercises: -SAQs 4x10 -active assisted abduction slides 3x10 -glut sets 3x10 -active assisted heel slides within available range 3x10 -ankle pumps 3x20  Therapy Documentation Precautions:  Precautions Precautions: Fall Restrictions Weight Bearing Restrictions: No  Therapy/Group: Individual Therapy  Rayland Hamed K Zak Gondek 08/11/2018, 10:21 AM

## 2018-08-11 NOTE — Progress Notes (Signed)
  Patient ID: Jose Garrett, male   DOB: 10/02/1950, 68 y.o.   MRN: [id number]    Diagnosis codes:  R53.81;  N18.6;  I50.42  Height:   5\' 10"              Weight:   197 lbs      Patient suffers from debility s/p fem pop bypass graft, CHF and ESRD which impairs their ability to perform daily activities like toileting, dressing and mobility in the home.  A walker will not resolve issue with performing activities of daily living.  A wheelchair will allow patient to safely perform daily activities.  Patient is not able to propel themselves in the home using a standard weight wheelchair due to general weakness.  Patient can self propel in the lightweight wheelchair.    Lauraine Rinne, PA-C

## 2018-08-11 NOTE — Progress Notes (Signed)
Occupational Therapy Session Note  Patient Details  Name: Jose Garrett MRN: [id number] Date of Birth: 09-29-1950  Today's Date: 08/11/2018 OT Individual Time: 2595-6387 OT Individual Time Calculation (min): 55 min   Short Term Goals: Week 2:  OT Short Term Goal 1 (Week 2): STG=LTG 2/2 ELOS  Skilled Therapeutic Interventions/Progress Updates:    Pt greeted semi-reclined in bed and agreeable to OT treatment session. Pt denies any more nausea and vomiting. Pt completed sit<>stand from EOB with 2 trials, RW, and CGA. Pt ambulated to the sink for bathing/dressing tasks sit<>stand from wc. Pt completed all tasks with supervision overall and verbal cues for safety to lock brakes prior to standing. Worked on LB dressing strategies to don TED hose. Pt able to achieve figure 4 position to don R TED hose, and also could get into position with L LE, but when reaching forward, felt too much of a pull at the groin and needed OT assistance. Pt with good understanding of use of friction reducing device to help don TEDs. Pt then ambulated to therapy gym w/ RW and supervision. UB there-ex with ball toss chest press. Pt then ambulated back to room and left seated in wc with chair alarm on and needs met.   Therapy Documentation Precautions:  Precautions Precautions: Fall Restrictions Weight Bearing Restrictions: No Pain: Denies pain  Therapy/Group: Individual Therapy  Valma Cava 08/11/2018, 8:08 AM

## 2018-08-11 NOTE — Progress Notes (Signed)
[facility] Progress Note   Subjective: Seen in room.  Working hard with Reahb  Objective Vitals:   08/10/18 1514 08/10/18 1937 08/10/18 1938 08/11/18 0442  BP: 97/61 97/64 (!) 98/54 93/61  Pulse: 76 76 75 73  Resp: 18 20  16   Temp: 98.1 F (36.7 C) 98.3 F (36.8 C)  98.4 F (36.9 C)  TempSrc: Oral Oral    SpO2: 100% 99%  94%  Weight:    89.4 kg  Height:       Physical Exam General: Elderly man, NAD breathing easily on room air Heart: RRR; 2/6 murmur Lungs: CTA anteriorly Abdomen: soft, non-tender Extremities: 2 + bilat LLE edema  1 + on right Dialysis Access:  AVF + thrill/bruit   home meds:   - aspirin 81/ clopidogrel 75 hs/ atorvastatin 80   - gabapentin 300mg  am+ 900mg  hs/ oxycodone IR prn/ oxycontin 10 bid   - sucroferric oxyhydroxide 2-3 tabs ac tid   - prn's/ vitamins/ supplements    [facility] MWF 4h 400/800 86kg 2/2 P4R AVF Hep 8000 Sensipar 60 TIW Calcitriol 1.25 TIW Mircera 50 q 2 weeks (last 4/29)  Assessment/Plan: 1. ESRD: on HD MWF. HD today  2.  Hyperkalemia: resolved  3. PAD with LLE critical limb ischemia: S/pL fem-pop bypass contributory to edema per patient  4. Volume excess - vol coming down,down to dry wt ,  Still quite edematous in legs, Keep SBP > 80 w/ UF on HD  4. Ischemic cardiomyopathy / severe AS / EF 35-40% - chronic hypotension related to these issues.  Added midodrine 10 mg pre HD mwf.   5. Anemia (d/t ESRD and ABLA): On aranesp and nulecit - increased both doses. SP 200 ug darbe here on 5/20 and 5/27. SP 5u prbc's total on both admissions including rehab.  Hb now stable 10-11 range -B12, folate ok.  Tsat 23%, getting ferric gluconate 4 doses ordered w HD.  Stool guiac negative.  6. Secondary hyperparathyroidism: Continue home velphoro, Sensipar, calcitriol   7. Elevated troponin: S/p cath with severe single vessel CAD chronic total chronic occlusion of RCA. Medical therapy recommended. No  targets for PCI.  8. Thrombocytopenia - plts 114/5/29 - follow - on Sq heparin and heparin with HD  9. Deconditioning - CIR -adm 5/18  10. Nutrition - added multivitamin and prostat for low alb  Amalia Hailey, PA-C [facility] 08/11/18 11:49 am Bee[address] - [phone]  Additional Objective Labs: Basic Metabolic Panel: Recent Labs  Lab 08/04/18 1513 08/06/18 1416 08/08/18 1344  NA 135 134* 132*  K 4.6 4.1 4.2  CL 96* 94* 93*  CO2 21* 23 25  GLUCOSE 118* 110* 119*  BUN 71* 54* 50*  CREATININE 10.20* 8.79* 8.20*  CALCIUM 8.9 9.1 9.4  PHOS 4.9* 5.2* 4.7*   Liver Function Tests: Recent Labs  Lab 08/04/18 1513 08/06/18 1416 08/08/18 1344  ALBUMIN 2.9* 2.8* 3.1*   CBC: Recent Labs  Lab 08/04/18 1512 08/07/18 0608 08/08/18 1344  WBC 11.6* 5.5 5.2  NEUTROABS  --  3.7  --   HGB 10.8* 10.8* 10.3*  HCT 33.4* 32.6* 31.8*  MCV 99.4 97.3 98.1  PLT 128* 121* 114*   Medications: . ferric gluconate (FERRLECIT/NULECIT) IV     . aspirin EC  81 mg Oral Daily  . atorvastatin  80 mg Oral Q supper  . calcitRIOL  1.25 mcg Oral Q M,W,F-HD  . Chlorhexidine Gluconate Cloth  6 each Topical Q0600  . Chlorhexidine Gluconate  Cloth  6 each Topical [id number]  . cinacalcet  60 mg Oral Q M,W,F-HD  . clopidogrel  75 mg Oral Q supper  . darbepoetin (ARANESP) injection - DIALYSIS  200 mcg Intravenous Q Wed-HD  . docusate sodium  100 mg Oral TID  . gabapentin  300 mg Oral q morning - 10a  . heparin  5,000 Units Subcutaneous Q8H  . hydrocerin   Topical BID  . loratadine  10 mg Oral Daily  . midodrine  10 mg Oral Q M,W,F-HD  . pantoprazole  40 mg Oral Daily  . polyethylene glycol  17 g Oral BID  . sucroferric oxyhydroxide  1,000 mg Oral TID WC  . vitamin B-12  1,000 mcg Oral Daily

## 2018-08-12 ENCOUNTER — Inpatient Hospital Stay (HOSPITAL_COMMUNITY): Payer: Medicare Other | Admitting: Occupational Therapy

## 2018-08-12 ENCOUNTER — Ambulatory Visit (HOSPITAL_COMMUNITY): Payer: Medicare Other | Admitting: Physical Therapy

## 2018-08-12 ENCOUNTER — Encounter (HOSPITAL_COMMUNITY): Payer: Medicare Other | Admitting: Occupational Therapy

## 2018-08-12 MED ORDER — OXYCODONE HCL 5 MG PO TABS: 5.0000 mg | ORAL_TABLET | Freq: Four times a day (QID) | ORAL | 0 refills | Status: DC | PRN

## 2018-08-12 MED ORDER — GABAPENTIN 300 MG PO CAPS: 300.0000 mg | ORAL_CAPSULE | Freq: Every morning | ORAL | 0 refills | Status: DC

## 2018-08-12 MED ORDER — CALCITRIOL 0.25 MCG PO CAPS: 1.2500 ug | ORAL_CAPSULE | ORAL | 0 refills | Status: DC

## 2018-08-12 MED ORDER — MIDODRINE HCL 10 MG PO TABS: 10.0000 mg | ORAL_TABLET | ORAL | 0 refills | Status: DC

## 2018-08-12 MED ORDER — ATORVASTATIN CALCIUM 80 MG PO TABS: 80.0000 mg | ORAL_TABLET | Freq: Every day | ORAL | 0 refills | Status: DC

## 2018-08-12 MED ORDER — CLOPIDOGREL BISULFATE 75 MG PO TABS: 75.0000 mg | ORAL_TABLET | Freq: Every day | ORAL | 0 refills | Status: DC

## 2018-08-12 MED ORDER — RENA-VITE PO TABS: 1.0000 | ORAL_TABLET | Freq: Every day | ORAL | 0 refills | Status: AC

## 2018-08-12 MED ORDER — CINACALCET HCL 30 MG PO TABS: 60.0000 mg | ORAL_TABLET | ORAL | 0 refills | Status: DC

## 2018-08-12 MED ORDER — POLYETHYLENE GLYCOL 3350 17 G PO PACK: 17.0000 g | PACK | Freq: Two times a day (BID) | ORAL | 0 refills | Status: DC

## 2018-08-12 MED ORDER — PANTOPRAZOLE SODIUM 40 MG PO TBEC: 40.0000 mg | DELAYED_RELEASE_TABLET | Freq: Every day | ORAL | 0 refills | Status: DC

## 2018-08-12 MED ORDER — CALCIUM CARBONATE ANTACID 500 MG PO CHEW: 1.0000 | CHEWABLE_TABLET | Freq: Every day | ORAL | 0 refills | Status: DC | PRN

## 2018-08-12 MED ORDER — SUCROFERRIC OXYHYDROXIDE 500 MG PO CHEW: 1000.0000 mg | CHEWABLE_TABLET | Freq: Three times a day (TID) | ORAL | 0 refills | Status: DC

## 2018-08-12 MED ORDER — DOCUSATE SODIUM 100 MG PO CAPS: 100.0000 mg | ORAL_CAPSULE | Freq: Three times a day (TID) | ORAL | 0 refills | Status: DC

## 2018-08-12 MED ORDER — DARBEPOETIN ALFA 200 MCG/0.4ML IJ SOSY: 200.0000 ug | PREFILLED_SYRINGE | INTRAMUSCULAR | Status: DC

## 2018-08-12 NOTE — Progress Notes (Signed)
Occupational Therapy Discharge Summary  Patient Details  Name: Jose Garrett MRN: [id number] Date of Birth: 1951-03-11  Today's Date: 08/12/2018  Session 1 OT Individual Time: 7017-7939 OT Individual Time Calculation (min): 54 min   Pt greeted seated EOB and agreeable to OT treatment session focused on increased independence with BADL tasks. Pt ambulated throughout room with RW and supervision with min verbal cues for safe technique with accessing drawers or transporting items. Bathing/dressing completed at overall supervision level.   Session 2 OT Individual Time: 1030-1100 OT Individual Time Calculation (min): 30 min    Pt greeted seated in wc and agreeable to OT treatment session. Pt ambulated to therapy gym w/ RW and supervision. UB there-ex on [employer] arm bike on level 3 with RPMs above 12. Pt completed 10 mins without rest break. Pt ambulated back to room in similar fashion and left seated in wc with call bell in reach and needs met.   Session 3 OT Individual Time: 0300-9233 OT Individual Time Calculation (min): 45 min    Pt greeted sitting in wc and agreeable to OT treatment session. While waiting for family, OT discussed pt progress, dc plan, energy conservation, and home modifications for safe BADL tasks in home environment. OT pushed pt in wc to therapy apartment for time management as family was late to session. Educated pt and family on tub bench transfer in simulated home environment. Educated on safety awareness and had pt ambulate in functional environment with pt. Pt left seated in wc with needs met and family present awaiting next therapy session.    Patient has met 11 of 11 long term goals due to improved activity tolerance, improved balance, postural control, ability to compensate for deficits and functional use of  LEFT lower extremity.  Patient to discharge at overall Supervision level.  Patient's care partner is independent to provide the necessary physical assistance at  discharge.    Reasons goals not met: n/a  Recommendation:  Patient will benefit from ongoing skilled OT services in home health setting to continue to advance functional skills in the area of BADL. Equipment: 3-in-1 BSC, tub transfer bench (to be purchased privately)  Reasons for discharge: treatment goals met and discharge from hospital  Patient/family agrees with progress made and goals achieved: Yes  OT Discharge Pain  denies pain ADL ADL Eating: Independent Grooming: Independent Upper Body Bathing: Independent Lower Body Bathing: Supervision/safety Upper Body Dressing: Independent Lower Body Dressing: Supervision/safety Toileting: Supervision/safety Toilet Transfer: Distant supervision Tub/Shower Transfer: Close supervison Perception  Perception: Within Functional Limits Praxis Praxis: Intact Cognition Arousal/Alertness: Awake/alert Orientation Level: Oriented X4 Memory: Appears intact Awareness: Appears intact Problem Solving: Impaired(mild problem solving impairments) Safety/Judgment: Appears intact Sensation Sensation Light Touch: Impaired Detail Light Touch Impaired Details: Impaired RLE;Impaired LLE(B LE neuropothy ) Coordination Gross Motor Movements are Fluid and Coordinated: No Fine Motor Movements are Fluid and Coordinated: Yes Motor  Motor Motor: Within Functional Limits Motor - Discharge Observations: generalized weakness Mobility  Bed Mobility Supine to Sit: Independent Sit to Supine: Independent Transfers Sit to Stand: Independent with assistive device Stand to Sit: Independent with assistive device  Trunk/Postural Assessment  Cervical Assessment Cervical Assessment: Within Functional Limits Thoracic Assessment Thoracic Assessment: Within Functional Limits Lumbar Assessment Lumbar Assessment: Within Functional Limits Postural Control Postural Control: Within Functional Limits  Balance Balance Balance Assessed: Yes Static Sitting  Balance Static Sitting - Balance Support: No upper extremity supported Static Sitting - Level of Assistance: 7: Independent Dynamic Sitting Balance Dynamic Sitting - Balance  Support: No upper extremity supported Dynamic Sitting - Level of Assistance: 7: Independent Static Standing Balance Static Standing - Balance Support: During functional activity Static Standing - Level of Assistance: 5: Stand by assistance Dynamic Standing Balance Dynamic Standing - Balance Support: During functional activity;No upper extremity supported Dynamic Standing - Level of Assistance: 5: Stand by assistance Extremity/Trunk Assessment RUE Assessment RUE Assessment: Within Functional Limits LUE Assessment LUE Assessment: Within Functional Limits   Daneen Schick Cristle Jared 08/12/2018, 3:40 PM

## 2018-08-12 NOTE — Progress Notes (Signed)
[name] PHYSICAL MEDICINE & REHABILITATION PROGRESS NOTE  Subjective/Complaints: Patient seen laying in bed this morning.  He states he slept well overnight.  He states he is looking forward to discharge tomorrow.  He was seen by nephrology yesterday, notes reviewed.  ROS: Denies CP, shortness of breath, nausea, vomiting, diarrhea.  Objective: Vital Signs: Blood pressure (!) 113/58, pulse 79, temperature 98.3 F (36.8 C), temperature source Oral, resp. rate 18, height 5\' 10"  (1.778 m), weight 86.8 kg, SpO2 96 %. No results found. No results for input(s): WBC, HGB, HCT, PLT in the last 72 hours. No results for input(s): NA, K, CL, CO2, GLUCOSE, BUN, CREATININE, CALCIUM in the last 72 hours.  Physical Exam: BP (!) 113/58 (BP Location: Left Arm)   Pulse 79   Temp 98.3 F (36.8 C) (Oral)   Resp 18   Ht 5\' 10"  (1.778 m)   Wt 86.8 kg   SpO2 96%   BMI 27.46 kg/m  Constitutional: No distress . Vital signs reviewed. HENT: Normocephalic.  Atraumatic. Eyes: EOMI.  No discharge. Cardiovascular: No JVD. Respiratory: Normal effort. GI: Non-distended. Musculoskeletal: Left foot >right lower extremity edema, stable Neurological:  Alert and oriented Follows commands.  Motor: Right lower extremity: 4/5 proximal distal, stable Left lower extremity: Hip flexion, knee extension 2+-3-/5, ankle dorsiflexion 4-/5 Skin: Skin iswarm. He isnot diaphoretic. Femoropopliteal bypass site C/D/I  Circular appearing erythema with fluid at distal end of bypass site, unchanged Psychiatric: He has a normal mood and affect. Hisbehavior is normal.Judgmentand thought contentnormal.   Assessment/Plan: 1. Functional deficits secondary to debility which require 3+ hours per day of interdisciplinary therapy in a comprehensive inpatient rehab setting.  Physiatrist is providing close team supervision and 24 hour management of active medical problems listed below.  Physiatrist and rehab team continue  to assess barriers to discharge/monitor patient progress toward functional and medical goals  Care Tool:  Bathing    Body parts bathed by patient: Right arm, Left arm, Chest, Abdomen, Buttocks, Left upper leg, Face, Right lower leg, Right upper leg, Front perineal area   Body parts bathed by helper: Right lower leg, Left lower leg Body parts n/a: Left lower leg   Bathing assist Assist Level: Supervision/Verbal cueing     Upper Body Dressing/Undressing Upper body dressing   What is the patient wearing?: Pull over shirt    Upper body assist Assist Level: Independent    Lower Body Dressing/Undressing Lower body dressing      What is the patient wearing?: Underwear/pull up, Pants     Lower body assist Assist for lower body dressing: Supervision/Verbal cueing     Toileting Toileting    Toileting assist Assist for toileting: Supervision/Verbal cueing     Transfers Chair/bed transfer  Transfers assist     Chair/bed transfer assist level: Supervision/Verbal cueing     Locomotion Ambulation   Ambulation assist   Ambulation activity did not occur: Safety/medical concerns  Assist level: Supervision/Verbal cueing Assistive device: Walker-rolling Max distance: 150'   Walk 10 feet activity   Assist  Walk 10 feet activity did not occur: Safety/medical concerns  Assist level: Supervision/Verbal cueing Assistive device: Walker-rolling   Walk 50 feet activity   Assist Walk 50 feet with 2 turns activity did not occur: Safety/medical concerns  Assist level: Supervision/Verbal cueing Assistive device: Walker-rolling    Walk 150 feet activity   Assist Walk 150 feet activity did not occur: Safety/medical concerns  Assist level: Supervision/Verbal cueing Assistive device: Walker-rolling    Walk 10  feet on uneven surface  activity   Assist Walk 10 feet on uneven surfaces activity did not occur: Safety/medical concerns          Wheelchair     Assist Will patient use wheelchair at discharge?: Yes Type of Wheelchair: Manual    Wheelchair assist level: Supervision/Verbal cueing Max wheelchair distance: 150'    Wheelchair 50 feet with 2 turns activity    Assist        Assist Level: Supervision/Verbal cueing   Wheelchair 150 feet activity     Assist     Assist Level: Supervision/Verbal cueing      Medical Problem List and Plan: 1.Debilitysecondary to PAD status post left common femoral to below-knee popliteal artery bypass 04/03/4823 complicated by non-STEMI  Continue CIR  Plan for d/c tomorrow  Will see patient for transitional care management in 1-2 weeks post-discharge 2. Antithrombotics: -DVT/anticoagulation:Subcutaneous heparin -antiplatelet therapy: Aspirin 81 mg daily, Plavix 75 mg daily 3. Pain Management:Neurontin 300 mg every evening, oxycodone as needed 4. Mood:Provide emotional support -antipsychotic agents: N/A 5. Neuropsych: This patientiscapable of making decisions on hisown behalf. 6. Skin/Wound Care:Routine skin checks. Local skin care, protection to necrotic areas along toes of left foot.   Will need to monitor closely for left lower extremity.  Monitor circular lesion with fluid as well- no further changes at present per vascular-follow-up as outpatient.  Eucerin cream added, NOT not be applied near nectoric areas 7. Fluids/Electrolytes/Nutrition: Routine in and outs 8. Acute blood loss anemia. Continue Aranesp Nulecit per nephro  Hemoglobin 10.3 on 5/29  Labs with HD  Continue to monitor 9.Hypotension/aortic stenosis.   Improved on 6/2 10. End-stage renal disease. Continue hemodialysis as directed.  Recs per nephro   Plan for HD prior to d/c tomorrow 11. Hyperlipidemia. Lipitor 12. Chronic combined diastolic systolic congestive heart failure with EF of 35-40% and severe left ear. Monitor for any signs of fluid  overload  Daily weights ordered Filed Weights   08/11/18 1420 08/11/18 1837 08/12/18 0510  Weight: 88.6 kg 85.6 kg 86.8 kg   ?  Reliability on 6/2 13.Constipation. Laxative assistance 14.  Thrombocytopenia-appears to be chronic  Platelets 114 on 5/29  Labs with HD  Continue to monitor 15.  Severe constipation  Bowel meds increased on 5/21  Will consider further increase in medications if necessary 16.  Severe CAD  Single-vessel disease total occlusion of RCA  Medical management 17.  Leukocytosis: Resolved  Afebrile  Continue to monitor 18. GERD/gas  Cont Protonix  TUMS prn started on 5/26  Simethicone PRN ordered on 5/31  LOS: 15 days A FACE TO FACE EVALUATION WAS PERFORMED  Jose Garrett Lorie Phenix 08/12/2018, 9:28 AM

## 2018-08-12 NOTE — Progress Notes (Signed)
Patient ID: Jose Garrett, male   DOB: 09-Jan-1951, 68 y.o.   MRN: [id number]  [facility] Progress Note   Assessment/ Plan:   1.  Peripheral vascular disease with left lower extremity critical limb ischemia status post left femoropopliteal bypass.  Currently in inpatient rehabilitation for strengthening/balance and improvement of mobility with plans for discharge home tomorrow. 2. ESRD: Continue hemodialysis on a Monday/Wednesday/Friday schedule with early hemodialysis tomorrow to facilitate discharge later. 3. Anemia: Without overt loss, relatively stable hemoglobin/hematocrit on ESA. 4. CKD-MBD: Calcium/phosphorus level within acceptable range when last checked, continue Sensipar and calcitriol for PTH control. 5. Nutrition: Continue renal diet with oral nutritional supplementation. 6. Hypertension: Blood pressure fairly controlled, continue to monitor on current management/HD.  Subjective:   Underwent hemodialysis yesterday, uneventful   Objective:   BP (!) 113/58 (BP Location: Left Arm)   Pulse 79   Temp 98.3 F (36.8 C) (Oral)   Resp 18   Ht 5\' 10"  (1.778 m)   Wt 86.8 kg   SpO2 96%   BMI 27.46 kg/m   Physical Exam: Gen: Comfortably sitting up in recliner, watching television CVS: Pulse regular rhythm, normal rate, S1 and S2 normal Resp: Clear to auscultation bilaterally, no distinct rales or rhonchi Abd: Soft, flat, nontender Ext: Right radiocephalic fistula, poor augmentation.  1-2+ bilateral lower extremity edema  Labs: BMET Recent Labs  Lab 08/06/18 1416 08/08/18 1344  NA 134* 132*  K 4.1 4.2  CL 94* 93*  CO2 23 25  GLUCOSE 110* 119*  BUN 54* 50*  CREATININE 8.79* 8.20*  CALCIUM 9.1 9.4  PHOS 5.2* 4.7*   CBC Recent Labs  Lab 08/07/18 0608 08/08/18 1344  WBC 5.5 5.2  NEUTROABS 3.7  --   HGB 10.8* 10.3*  HCT 32.6* 31.8*  MCV 97.3 98.1  PLT 121* 114*   Medications:    . aspirin EC  81 mg Oral Daily  . atorvastatin  80 mg Oral Q  supper  . calcitRIOL  1.25 mcg Oral Q M,W,F-HD  . Chlorhexidine Gluconate Cloth  6 each Topical Q0600  . Chlorhexidine Gluconate Cloth  6 each Topical Q0600  . cinacalcet  60 mg Oral Q M,W,F-HD  . clopidogrel  75 mg Oral Q supper  . darbepoetin (ARANESP) injection - DIALYSIS  200 mcg Intravenous Q Wed-HD  . docusate sodium  100 mg Oral TID  . feeding supplement (PRO-STAT SUGAR FREE 64)  30 mL Oral BID  . gabapentin  300 mg Oral q morning - 10a  . heparin  5,000 Units Subcutaneous Q8H  . hydrocerin   Topical BID  . loratadine  10 mg Oral Daily  . midodrine  10 mg Oral Q M,W,F-HD  . multivitamin  1 tablet Oral QHS  . pantoprazole  40 mg Oral Daily  . polyethylene glycol  17 g Oral BID  . sucroferric oxyhydroxide  1,000 mg Oral TID WC  . vitamin B-12  1,000 mcg Oral Daily   Elmarie Shiley, MD 08/12/2018, 9:11 AM

## 2018-08-13 ENCOUNTER — Telehealth: Payer: Self-pay | Admitting: Hematology

## 2018-08-13 LAB — CBC
HCT: 35 % — ABNORMAL LOW (ref 39.0–52.0)
Hemoglobin: 11.2 g/dL — ABNORMAL LOW (ref 13.0–17.0)
MCH: 31.6 pg (ref 26.0–34.0)
MCHC: 32 g/dL (ref 30.0–36.0)
MCV: 98.9 fL (ref 80.0–100.0)
Platelets: 128 10*3/uL — ABNORMAL LOW (ref 150–400)
RBC: 3.54 MIL/uL — ABNORMAL LOW (ref 4.22–5.81)
RDW: 17.3 % — ABNORMAL HIGH (ref 11.5–15.5)
WBC: 6.1 10*3/uL (ref 4.0–10.5)
nRBC: 0 % (ref 0.0–0.2)

## 2018-08-13 LAB — RENAL FUNCTION PANEL
Albumin: 3.3 g/dL — ABNORMAL LOW (ref 3.5–5.0)
Anion gap: 17 — ABNORMAL HIGH (ref 5–15)
BUN: 41 mg/dL — ABNORMAL HIGH (ref 8–23)
CO2: 21 mmol/L — ABNORMAL LOW (ref 22–32)
Calcium: 9.5 mg/dL (ref 8.9–10.3)
Chloride: 95 mmol/L — ABNORMAL LOW (ref 98–111)
Creatinine, Ser: 7.91 mg/dL — ABNORMAL HIGH (ref 0.61–1.24)
GFR calc Af Amer: 7 mL/min — ABNORMAL LOW (ref >60)
GFR calc non Af Amer: 6 mL/min — ABNORMAL LOW (ref >60)
Glucose, Bld: 136 mg/dL — ABNORMAL HIGH (ref 70–99)
Phosphorus: 3.7 mg/dL (ref 2.5–4.6)
Potassium: 4.1 mmol/L (ref 3.5–5.1)
Sodium: 133 mmol/L — ABNORMAL LOW (ref 135–145)

## 2018-08-13 MED ORDER — CALCITRIOL 0.25 MCG PO CAPS
ORAL_CAPSULE | ORAL | Status: AC
  Filled 2018-08-13: qty 1

## 2018-08-13 MED ORDER — HEPARIN SODIUM (PORCINE) 1000 UNIT/ML DIALYSIS: 40.0000 [IU]/kg | INTRAMUSCULAR | Status: DC | PRN

## 2018-08-13 MED ORDER — CALCITRIOL 0.5 MCG PO CAPS
ORAL_CAPSULE | ORAL | Status: AC
  Filled 2018-08-13: qty 2

## 2018-08-13 NOTE — Procedures (Signed)
Patient seen on Hemodialysis. BP 134/71 (BP Location: Right Arm)   Pulse 92   Temp 98.1 F (36.7 C) (Oral)   Resp 18   Ht 5\' 10"  (1.778 m)   Wt 86.8 kg   SpO2 96%   BMI 27.46 kg/m   QB 400, UF goal 2.0 L Tolerating treatment without complaints at this time.  Elmarie Shiley MD [facility] Office # [phone] Pager # [phone] 8:36 AM

## 2018-08-13 NOTE — Plan of Care (Signed)
  Problem: Consults Goal: RH GENERAL PATIENT EDUCATION Description See Patient Education module for education specifics. Outcome: Completed/Met Goal: Skin Care Protocol Initiated - if Braden Score 18 or less Description If consults are not indicated, leave blank or document N/A Outcome: Completed/Met Goal: Nutrition Consult-if indicated Outcome: Completed/Met Goal: Diabetes Guidelines if Diabetic/Glucose > 140 Description If diabetic or lab glucose is > 140 mg/dl - Initiate Diabetes/Hyperglycemia Guidelines & Document Interventions  Outcome: Completed/Met   Problem: RH BOWEL ELIMINATION Goal: RH STG MANAGE BOWEL WITH ASSISTANCE Description STG Manage Bowel with min.Assistance.  Outcome: Completed/Met Goal: RH STG MANAGE BOWEL W/MEDICATION W/ASSISTANCE Description STG Manage Bowel with Medication with min. Assistance.  Outcome: Completed/Met   Problem: RH SKIN INTEGRITY Goal: RH STG SKIN FREE OF INFECTION/BREAKDOWN Description With mod. assist  Outcome: Completed/Met Goal: RH STG MAINTAIN SKIN INTEGRITY WITH ASSISTANCE Description STG Maintain Skin Integrity With mod.Assistance.  Outcome: Completed/Met Goal: RH STG ABLE TO PERFORM INCISION/WOUND CARE W/ASSISTANCE Description STG Able To Perform Incision/Wound Care With Mod.Assistance.  Outcome: Completed/Met   Problem: RH SAFETY Goal: RH STG ADHERE TO SAFETY PRECAUTIONS W/ASSISTANCE/DEVICE Description STG Adhere to Safety Precautions With Mod.Assistance/Device.  Outcome: Completed/Met Goal: RH STG DECREASED RISK OF FALL WITH ASSISTANCE Description STG Decreased Risk of Fall With Mod. Assistance.  Outcome: Completed/Met   Problem: RH PAIN MANAGEMENT Goal: RH STG PAIN MANAGED AT OR BELOW PT'S PAIN GOAL Description Less than 3  Outcome: Completed/Met   Problem: RH KNOWLEDGE DEFICIT GENERAL Goal: RH STG INCREASE KNOWLEDGE OF SELF CARE AFTER HOSPITALIZATION Description Pt. Will be able to verbalized how to manage  his medications and post op. Care at home   Outcome: Completed/Met

## 2018-08-13 NOTE — Progress Notes (Signed)
Patient ID: Jose Garrett, male   DOB: 1950-12-27, 68 y.o.   MRN: [id number]  [facility] Progress Note   Assessment/ Plan:   1.  Peripheral vascular disease with left lower extremity critical limb ischemia status post left femoropopliteal bypass.  With improving mobility s/p CIR and planned for DC today. Has some residual post op edema over LEs 2. ESRD: Continue hemodialysis on a Monday/Wednesday/Friday [facility]) schedule upon DC after HD today. 3. Anemia: Without overt loss, relatively stable hemoglobin/hematocrit on ESA. 4. CKD-MBD: Calcium/phosphorus level within acceptable range when last checked, continue Sensipar and calcitriol for PTH control. 5. Nutrition: Continue renal diet with oral nutritional supplementation. 6. Hypertension: Blood pressure fairly controlled, continue to monitor on current management/HD.  Subjective:   Reports to be looking forward to go home today.   Objective:   BP 134/71 (BP Location: Right Arm)   Pulse 92   Temp 98.1 F (36.7 C) (Oral)   Resp 18   Ht 5\' 10"  (1.778 m)   Wt 86.8 kg   SpO2 96%   BMI 27.46 kg/m   Physical Exam: Gen: Comfortably resting in dialysis CVS: Pulse regular rhythm, normal rate, S1 and S2 normal Resp: Clear to auscultation bilaterally, no distinct rales or rhonchi Abd: Soft, flat, nontender Ext: Right radiocephalic fistula cannulated.  1+ bilateral lower extremity edema  Labs: BMET Recent Labs  Lab 08/06/18 1416 08/08/18 1344  NA 134* 132*  K 4.1 4.2  CL 94* 93*  CO2 23 25  GLUCOSE 110* 119*  BUN 54* 50*  CREATININE 8.79* 8.20*  CALCIUM 9.1 9.4  PHOS 5.2* 4.7*   CBC Recent Labs  Lab 08/07/18 0608 08/08/18 1344 08/13/18 0806  WBC 5.5 5.2 6.1  NEUTROABS 3.7  --   --   HGB 10.8* 10.3* 11.2*  HCT 32.6* 31.8* 35.0*  MCV 97.3 98.1 98.9  PLT 121* 114* 128*   Medications:    . aspirin EC  81 mg Oral Daily  . atorvastatin  80 mg Oral Q supper  . calcitRIOL  1.25 mcg Oral Q M,W,F-HD  .  Chlorhexidine Gluconate Cloth  6 each Topical Q0600  . Chlorhexidine Gluconate Cloth  6 each Topical Q0600  . cinacalcet  60 mg Oral Q M,W,F-HD  . clopidogrel  75 mg Oral Q supper  . darbepoetin (ARANESP) injection - DIALYSIS  200 mcg Intravenous Q Wed-HD  . docusate sodium  100 mg Oral TID  . feeding supplement (PRO-STAT SUGAR FREE 64)  30 mL Oral BID  . gabapentin  300 mg Oral q morning - 10a  . heparin  5,000 Units Subcutaneous Q8H  . hydrocerin   Topical BID  . loratadine  10 mg Oral Daily  . midodrine  10 mg Oral Q M,W,F-HD  . multivitamin  1 tablet Oral QHS  . pantoprazole  40 mg Oral Daily  . polyethylene glycol  17 g Oral BID  . sucroferric oxyhydroxide  1,000 mg Oral TID WC  . vitamin B-12  1,000 mcg Oral Daily   Elmarie Shiley, MD 08/13/2018, 8:37 AM

## 2018-08-13 NOTE — Telephone Encounter (Signed)
Called patient per sch msg to schedule f/u appt. Spoke with patient. Confirmed date and time

## 2018-08-13 NOTE — Discharge Instructions (Signed)
Inpatient Rehab Discharge Instructions  [facility] Discharge date and time: No discharge date for patient encounter.   Activities/Precautions/ Functional Status: Activity: activity as tolerated Diet: renal diet Wound Care: keep wound clean and dry Functional status:  ___ No restrictions     ___ Walk up steps independently ___ 24/7 supervision/assistance   ___ Walk up steps with assistance ___ Intermittent supervision/assistance  ___ Bathe/dress independently ___ Walk with walker     _x__ Bathe/dress with assistance ___ Walk Independently    ___ Shower independently ___ Walk with assistance    ___ Shower with assistance ___ No alcohol     ___ Return to work/school ________    COMMUNITY REFERRALS UPON DISCHARGE:    Home Health:   PT     OT     RN                                  Agency:  [facility]      Phone: [phone]   Medical Equipment/Items Ordered: wheelchair, cushion, rolling walker and commode                                                     Agency/Supplier:  [facility] @ [phone]    Special Instructions:   No smoking, alcohol or driving  My questions have been answered and I understand these instructions. I will adhere to these goals and the provided educational materials after my discharge from the hospital.  Patient/Caregiver Signature _______________________________ Date __________  Clinician Signature _______________________________________ Date __________  Please bring this form and your medication list with you to all your follow-up doctor's appointments.

## 2018-08-13 NOTE — Progress Notes (Signed)
Patient has been discharged home with family. Discharge instruction given to patient by PA, medications were sent to his pharmacy. Patient is waiting on his ride at this tine.

## 2018-08-14 DIAGNOSIS — Z992 Dependence on renal dialysis: Secondary | ICD-10-CM | POA: Diagnosis not present

## 2018-08-14 DIAGNOSIS — I2510 Atherosclerotic heart disease of native coronary artery without angina pectoris: Secondary | ICD-10-CM | POA: Diagnosis not present

## 2018-08-14 DIAGNOSIS — I739 Peripheral vascular disease, unspecified: Secondary | ICD-10-CM | POA: Diagnosis not present

## 2018-08-14 DIAGNOSIS — I132 Hypertensive heart and chronic kidney disease with heart failure and with stage 5 chronic kidney disease, or end stage renal disease: Secondary | ICD-10-CM | POA: Diagnosis not present

## 2018-08-14 DIAGNOSIS — Z95828 Presence of other vascular implants and grafts: Secondary | ICD-10-CM | POA: Diagnosis not present

## 2018-08-14 DIAGNOSIS — I214 Non-ST elevation (NSTEMI) myocardial infarction: Secondary | ICD-10-CM | POA: Diagnosis not present

## 2018-08-14 DIAGNOSIS — I5042 Chronic combined systolic (congestive) and diastolic (congestive) heart failure: Secondary | ICD-10-CM | POA: Diagnosis not present

## 2018-08-14 DIAGNOSIS — Z48812 Encounter for surgical aftercare following surgery on the circulatory system: Secondary | ICD-10-CM | POA: Diagnosis not present

## 2018-08-14 DIAGNOSIS — N186 End stage renal disease: Secondary | ICD-10-CM | POA: Diagnosis not present

## 2018-08-14 NOTE — Progress Notes (Signed)
Social Work  Discharge Note  The overall goal for the admission was met for:   Discharge location: Yes - 16 days  Length of Stay: Yes - supervision to mod independent overall  Discharge activity level: Yes  Home/community participation: Yes  Services provided included: MD, RD, PT, OT, SLP, RN, TR, Pharmacy, [place]: Medicare  Follow-up services arranged: Home Health: RN, PT, OT via [place], DME: 18x18 lightweight w/c, cushion, rolling walker, 3n1 commode via [facility] and Patient/Family has no preference for HH/DME agencies  Comments (or additional information):      Contact info:  Patient @ [phone]        Daughter, Alysia Penna @ [phone]  Patient/Family verbalized understanding of follow-up arrangements: Yes  Individual responsible for coordination of the follow-up plan: pt  Confirmed correct DME delivered: Lennart Pall 08/14/2018    Julieth Tugman

## 2018-08-15 ENCOUNTER — Other Ambulatory Visit: Payer: Self-pay | Admitting: *Deleted

## 2018-08-15 ENCOUNTER — Other Ambulatory Visit (HOSPITAL_COMMUNITY)
Admission: RE | Admit: 2018-08-15 | Discharge: 2018-08-15 | Disposition: A | Discharge status: Home / Self Care | Payer: Medicare Other | Source: Ambulatory Visit | Attending: Vascular Surgery | Admitting: Vascular Surgery

## 2018-08-15 ENCOUNTER — Ambulatory Visit (INDEPENDENT_AMBULATORY_CARE_PROVIDER_SITE_OTHER): Payer: Self-pay | Admitting: Vascular Surgery

## 2018-08-15 ENCOUNTER — Other Ambulatory Visit: Payer: Self-pay

## 2018-08-15 ENCOUNTER — Encounter: Payer: Self-pay | Admitting: Vascular Surgery

## 2018-08-15 ENCOUNTER — Encounter: Payer: Medicare Other | Admitting: Vascular Surgery

## 2018-08-15 VITALS — BP 116/65 | HR 97 | Temp 97.6°F | Resp 20 | Ht 70.0 in | Wt 189.0 lb

## 2018-08-15 DIAGNOSIS — Z1159 Encounter for screening for other viral diseases: Secondary | ICD-10-CM | POA: Diagnosis not present

## 2018-08-15 DIAGNOSIS — Z01812 Encounter for preprocedural laboratory examination: Secondary | ICD-10-CM | POA: Diagnosis not present

## 2018-08-15 DIAGNOSIS — D509 Iron deficiency anemia, unspecified: Secondary | ICD-10-CM | POA: Diagnosis not present

## 2018-08-15 DIAGNOSIS — N2581 Secondary hyperparathyroidism of renal origin: Secondary | ICD-10-CM | POA: Diagnosis not present

## 2018-08-15 DIAGNOSIS — D631 Anemia in chronic kidney disease: Secondary | ICD-10-CM | POA: Diagnosis not present

## 2018-08-15 DIAGNOSIS — N186 End stage renal disease: Secondary | ICD-10-CM | POA: Diagnosis not present

## 2018-08-15 DIAGNOSIS — I739 Peripheral vascular disease, unspecified: Secondary | ICD-10-CM

## 2018-08-15 NOTE — Progress Notes (Signed)
    Subjective:     Patient ID: Jose Garrett, male   DOB: 11/24/1950, 68 y.o.   MRN: [id number]  HPI 68 year old male follows up after left femoral to popliteal artery bypass for gangrenous changes to his left toes.  Also has discoloration of his right toes.  Postoperatively underwent cardiac catheterization with no intervention.  Has now completed rehab.  Is on aspirin, Plavix, statin.   Review of Systems Denies any fevers, having scabbing of his left-sided toes, swelling left groin    Objective:   Physical Exam  Vitals:   08/15/18 0818  BP: 116/65  Pulse: 97  Resp: 20  Temp: 97.6 F (36.4 C)  SpO2: 97%   Awake alert oriented Nonlabored respirations Left groin incision clean dry intact with underlying swelling appears to be hematoma/seroma Swelling of the left lower extremity Left leg incision healing well Strong dorsalis pedis posterior tibial signals at the foot Left second and third toe with gangrenous changes, left third toe may have bony involvement Right toes have stable discoloration no wounds     Assessment/plan     68 year old male status post left femoropopliteal bypass for wounds.  Unfortunately the wounds have progressed and I do believe he would be best served with amputation of the left second and third toes.  Given his recent complications with surgery will need to be done awake with an ankle block and would keep him for observation overnight after surgery.  This is to prevent expansion of the gangrenous changes and also possible secondary infection of his graft.  Toe discoloration on the right appears stable at this time I recommended protecting his foot hopefully getting him through the left leg prior to considering bypass on the right.  He is in agreement with this wants to call to schedule to amputations in the future.  Okay to continue aspirin Plavix throughout the surgery.       Torrion Witter C. Donzetta Matters, MD [facility] Office:  [phone] Pager: [phone]

## 2018-08-16 LAB — NOVEL CORONAVIRUS, NAA (HOSP ORDER, SEND-OUT TO REF LAB; TAT 18-24 HRS): SARS-CoV-2, NAA: NOT DETECTED

## 2018-08-18 ENCOUNTER — Encounter (HOSPITAL_COMMUNITY): Payer: Self-pay | Admitting: Vascular Surgery

## 2018-08-18 ENCOUNTER — Other Ambulatory Visit: Payer: Self-pay

## 2018-08-18 ENCOUNTER — Other Ambulatory Visit (HOSPITAL_COMMUNITY): Payer: Medicare Other

## 2018-08-18 DIAGNOSIS — D631 Anemia in chronic kidney disease: Secondary | ICD-10-CM | POA: Diagnosis not present

## 2018-08-18 DIAGNOSIS — D509 Iron deficiency anemia, unspecified: Secondary | ICD-10-CM | POA: Diagnosis not present

## 2018-08-18 DIAGNOSIS — N186 End stage renal disease: Secondary | ICD-10-CM | POA: Diagnosis not present

## 2018-08-18 DIAGNOSIS — N2581 Secondary hyperparathyroidism of renal origin: Secondary | ICD-10-CM | POA: Diagnosis not present

## 2018-08-18 NOTE — Progress Notes (Signed)
Anesthesia Chart Review: Kathleene Hazel    Case:  [id number] Date/Time:  08/19/18 0715   Procedure:  AMPUTATION LEFT TOES 2 AND 3 (Left )   Anesthesia type:  Regional   Pre-op diagnosis:  nonviable tissue   Location:  [place] [facility] ROOM 12 / [place] [facility]   Surgeon:  Waynetta Sandy, MD      DISCUSSION: Patient is a 68 year old male scheduled for the above procedure. He is s/p left FPBG on 07/22/18. Post-operatively he had SOB and hypotension with elevated troponin in setting of ABL anemia and ESRD. Initially felt to be demand ischemia, but decision made to proceed with cath given continued rise of troponin (>65) on POD2. Cath showed known RCA occlusion with collateral flow with stable anatomy and at least moderate AS (moderate-severe AS by echo). Medical therapy for CAD recommended with consideration of TAVR in the future as eligible. Unfortunately, despite revascularization his toe wounds have continued to progress. Amputation of left 2nd, 3rd toes recommended to prevent expansion of gangrenous changes and also possible graft infection. Dr. Donzetta Matters is having patient continue ASA and Plavix perioperatively.   History includes former smoker (quit 2013), AL amyloidosis (with kidney involvement, diagnosed 09/28/14),ESRD (right radiocephalic AVF 50/53/97), anemia, CAD (NSTEMI 11/25/17, unable to engage RCA, medical therapy; elevated troponin with stable CAD, medical therapy 03/2018 and 07/2018), aortic stenosis (moderate-severe 07/2018), chronic combined systolic and diastolic CHF (diagnosed 08/7339), HTN, HLD, PAD (s/p left CFA-below knee popliteal bypass 6 mm PTFE 07/22/18).  Anesthesia history lists "my daughter can't take certain anesthesia agents" which was added to his history in 2016. He has had two surgeries at [facility] since then but no other specific details noted.   Negative COVID test 08/15/18. He is a same day work-up, so further evaluation on the day of surgery. Continued medical therapy following  elevated troponin/LHC 07/24/18 and future TAVR may be considered for moderate-severe AS, but currently with toe wounds. As above, staying on ASA and Plavix.   PROVIDERS: Patient, No Pcp Per  Lauree Chandler, MD is primary cardiologist Kathlyn Sacramento, MD is PV cardiologist Nephrologist is with [place] (MWF at [facility] location) Sullivan Lone, MD is hematologist   LABS: Labs as of 08/13/18 included: Lab Results  Component Value Date   WBC 6.1 08/13/2018   HGB 11.2 (L) 08/13/2018   HCT 35.0 (L) 08/13/2018   PLT 128 (L) 08/13/2018   GLUCOSE 136 (H) 08/13/2018   ALT 14 07/22/2018   AST 16 07/22/2018   NA 133 (L) 08/13/2018   K 4.1 08/13/2018   CL 95 (L) 08/13/2018   CREATININE 7.91 (H) 08/13/2018   BUN 41 (H) 08/13/2018   CO2 21 (L) 08/13/2018   TSH 1.591 03/26/2018   INR 1.2 07/22/2018   HGBA1C 6.2 (H) 03/26/2018    IMAGES: 1V CXR 07/23/18: IMPRESSION: Pattern of mild interlobular septal thickening, potentially chronic from prior episodes of CHF versus mild acute edema. No evidence of lobar pneumonia.   EKG: 07/23/18: Sinus tachycardia with 1st degree A-V block Rightward axis Non-specific intra-ventricular conduction block Marked ST abnormality, possible inferolateral subendocardial injury Abnormal ECG Since last tracing Rate faster Confirmed by Skeet Latch [phone]) on 07/24/2018 10:10:49 PM   CV: Echo 07/25/18: IMPRESSIONS  1. The left ventricle has moderately reduced systolic function, with an ejection fraction of 35-40%. The cavity size was normal. There is moderately increased left ventricular wall thickness. Left ventricular diastolic Doppler parameters are consistent  with impaired relaxation. Elevated left  atrial and left ventricular end-diastolic pressures The E/e' is >15.  2. Moderate akinesis of the left ventricular, entire inferior wall.  3. The right ventricle has normal systolic function. The cavity was normal. There is no  increase in right ventricular wall thickness.  4. The mitral valve is abnormal. Mild thickening of the mitral valve leaflet. There is mild mitral annular calcification present.  5. The tricuspid valve is grossly normal.  6. The aortic valve is tricuspid. Moderate calcification of the aortic valve. Aortic valve regurgitation is mild by color flow Doppler. Moderate-severe stenosis of the aortic valve.  7. The inferior vena cava was dilated in size with <50% respiratory variability.  8. The interatrial septum was not assessed.  9. When compared to the prior study: 03/24/2018: LVEF 40-45%, moderate aortic stenosis - mean gradient 30 mmHg. (Comparison 03/24/18: LVEF: 40-45%, moderate AS; 12/19/17: LVEF 65-70%, moderate AS; 11/26/17: LVEF 35-40%, moderate AS)  Cardiac cath 07/24/18: - Left Main  Vessel is normal in caliber. Vessel is angiographically normal.  - Left Anterior Descending  The proximal vessel has an eccentric 40% stenosis unchanged from past cath studies. The vessel is diffusely calcified.  Prox LAD lesion 40% stenosed  Prox LAD lesion is 40% stenosed. The lesion is moderately calcified.  - Ramus Intermedius  Large branch with no high-grade stenosis  Ramus lesion 40% stenosed  Ramus lesion is 40% stenosed.  - Left Circumflex  Ost Cx to Prox Cx lesion 65% stenosed  Ost Cx to Prox Cx lesion is 65% stenosed.  - Right Coronary Artery  Ost RCA to Mid RCA lesion 100% stenosed  Ost RCA to Mid RCA lesion is 100% stenosed.  Right Posterior Atrioventricular Branch  - Collaterals  Post Atrio filled by collaterals from 2nd Sept.  Conclusion: 1. Severe single vessel CAD with chronic total occlusion of the RCA, collateralized with L--->R collaterals 2. Mild calcific nonobstructive LCA disease, anatomy unchanged from previous cath studies 3. At least moderate AS, possibly severe LFLG - will check echo Recommend: medical therapy, no target for PCI  Carotid US 12/31/17: Summary: - Right  Carotid: Velocities in the right ICA are consistent with a 1-39% stenosis. - Left Carotid: Velocities in the left ICA are consistent with a 1-39% stenosis.               Non-hemodynamically significant plaque noted in the CCA. - Vertebrals:  Left vertebral artery demonstrates antegrade flow. Right vertebral              artery demonstrates retrograde flow suggesting right subclavian              steal. - Subclavians: Right subclavian artery was stenotic. Right subclavian artery flow              was disturbed. Normal flow hemodynamics were seen in the left              subclavian artery.              - The right subclavian artery flow is elevated and disturbed due to              right arm fistula and possible stenosis. The retrograde flow noted              in the right vertebral artery suggest subclavian artery stenosis;              right subclavian artery steal.              -  The flow in the right brachial artery is normal for a AVF. The left              brachial artey is normal and biphasic. (Findings reviewed by Melina Copa, PA-C and Dr. Angelena Form. Blood flow changes in his subclavian artery may be related to fistula or plaque. Will continue to follow.)   Past Medical History:  Diagnosis Date  . Amyloidosis ([place])   . Anemia Dx 2016  . Anxiety    "especially with procedure"- 07/21/2018  . CAD (coronary artery disease)    a. cardiac cath on 11/25/17 with occlusion of the RCA which could have been his event but the RCA could not be engaged. The RCA filled distally from left to right collaterals. There were no severe stenoses in the left coronary system. Medical therapy recommended, started on plavix.  . Chronic combined systolic and diastolic CHF (congestive heart failure) ([place])    a. EF dropped to 35-40% with grade 2 DD by echo 11/2017.  [name] Diarrhea   . ESRD (end stage renal disease) ([place])    ESRD- HEMO MWF- [facility]  . Family history of adverse reaction to anesthesia    "my  daughter can't take certain anesthesia agents" (09/08/2014)  . GERD (gastroesophageal reflux disease)   . Heart murmur   . History of blood transfusion 09/08/2014   "got hematoma after renal biopsy & HgB dropped"  . Hyperlipidemia Dx 2012  . Hypertension Dx 2012  . Myocardial infarction ([place]) 11/2017  . Neuropathy   . Peripheral vascular disease ([place])   . Restless legs     Past Surgical History:  Procedure Laterality Date  . ABDOMINAL AORTOGRAM W/LOWER EXTREMITY Bilateral 07/16/2018   Procedure: ABDOMINAL AORTOGRAM W/LOWER EXTREMITY;  Surgeon: Wellington Hampshire, MD;  Location: [place] [facility] LAB;  Service: Cardiovascular;  Laterality: Bilateral;  . AV FISTULA PLACEMENT Right 12/27/2015   Procedure: Right Arm ARTERIOVENOUS (AV) FISTULA CREATION;  Surgeon: Angelia Mould, MD;  Location: [place];  Service: Vascular;  Laterality: Right;  . COLONOSCOPY    . FEMORAL-POPLITEAL BYPASS GRAFT Left 07/22/2018   Procedure: left FEMORAL TO BELOW KNEE POPLITEAL ARTERY BYPASS GREAFT using 7mm removable ring propaten gore graft;  Surgeon: Waynetta Sandy, MD;  Location: [place];  Service: Vascular;  Laterality: Left;  . IR GENERIC HISTORICAL  01/27/2016   IR FLUORO GUIDE CV LINE RIGHT 01/27/2016 Arne Cleveland, MD [facility]-INTERV RAD  . IR GENERIC HISTORICAL  01/27/2016   IR US GUIDE VASC ACCESS RIGHT 01/27/2016 Arne Cleveland, MD [facility]INTER[facility]  . LAPAROSCOPIC CHOLECYSTECTOMY  03/2011  . LEFT HEART CATH AND CORONARY ANGIOGRAPHY N/A 11/25/2017   Procedure: LEFT HEART CATH AND CORONARY ANGIOGRAPHY;  Surgeon: Martinique, Peter M, MD;  Location: [place] [facility];  Service: Cardiovascular;  Laterality: N/A;  . LEFT HEART CATH AND CORONARY ANGIOGRAPHY N/A 07/24/2018   Procedure: LEFT HEART CATH AND CORONARY ANGIOGRAPHY;  Surgeon: Sherren Mocha, MD;  Location: [place] [facility];  Service: Cardiovascular;  Laterality: N/A;  . RENAL BIOPSY, PERCUTANEOUS Right 09/08/2014  . RIGHT/LEFT HEART CATH AND CORONARY  ANGIOGRAPHY N/A 03/25/2018   Procedure: RIGHT/LEFT HEART CATH AND CORONARY ANGIOGRAPHY;  Surgeon: Martinique, Peter M, MD;  Location: [place] [facility];  Service: Cardiovascular;  Laterality: N/A;  . TONSILLECTOMY  ~ 1960  . ULTRASOUND GUIDANCE FOR VASCULAR ACCESS  03/25/2018   Procedure: Ultrasound Guidance For Vascular Access;  Surgeon: Martinique, Peter M, MD;  Location: [place] [facility];  Service: Cardiovascular;;  MEDICATIONS: No current facility-administered medications for this encounter.    [name] acetaminophen (TYLENOL) 500 MG tablet  . aspirin EC 81 MG tablet  . atorvastatin (LIPITOR) 80 MG tablet  . cephALEXin (KEFLEX) 250 MG capsule  . cetirizine (ZYRTEC) 10 MG tablet  . clopidogrel (PLAVIX) 75 MG tablet  . gabapentin (NEURONTIN) 300 MG capsule  . LORazepam (ATIVAN) 1 MG tablet  . midodrine (PROAMATINE) 10 MG tablet  . multivitamin (RENA-VIT) TABS tablet  . mupirocin ointment (BACTROBAN) 2 %  . oxyCODONE (OXY IR/ROXICODONE) 5 MG immediate release tablet  . pantoprazole (PROTONIX) 40 MG tablet  . sucroferric oxyhydroxide (VELPHORO) 500 MG chewable tablet  . vitamin B-12 (CYANOCOBALAMIN) 1000 MCG tablet  . calcitRIOL (ROCALTROL) 0.25 MCG capsule  . calcium carbonate (TUMS - DOSED IN MG ELEMENTAL CALCIUM) 500 MG chewable tablet  . cinacalcet (SENSIPAR) 30 MG tablet  . Darbepoetin Alfa (ARANESP) 200 MCG/0.4ML SOSY injection  . docusate sodium (COLACE) 100 MG capsule    Myra Gianotti, PA-C Surgical Short Stay/Anesthesiology [facility] Phone [phone] [facility] Phone [phone] 08/18/2018 5:25 PM

## 2018-08-18 NOTE — Progress Notes (Signed)
Spoke with pt for pre-op call. Pt has hx of CAD, MI and CHF. Pt's cardiologist is Dr. Angelena Form. Pt states he is not diabetic. Pt was instructed to continue Aspirin and Plavix per Dr. Donzetta Matters.   Pt's PCP is [facility]  Pt had Covid 19 Testing done this afternoon. Pt states he is in self quarantine.    Coronavirus Screening  Have you experienced the following symptoms:  Cough NO Fever (>100.22F)  NO Runny nose NO Sore throat NO Difficulty breathing/shortness of breath  NO  Have you or a family member traveled in the last 14 days and where? NO   Patient reminded that hospital visitation restrictions are in effect and the importance of the restrictions.

## 2018-08-18 NOTE — Anesthesia Preprocedure Evaluation (Addendum)
Anesthesia Evaluation  Patient identified by MRN, date of birth, ID band Patient awake    Reviewed: Allergy & Precautions, NPO status , Patient's Chart, lab work & pertinent test results  Airway Mallampati: II  TM Distance: >3 FB Neck ROM: Full    Dental  (+) Edentulous Lower, Edentulous Upper   Pulmonary former smoker,    Pulmonary exam normal breath sounds clear to auscultation       Cardiovascular hypertension, + CAD, + Past MI, + Peripheral Vascular Disease and +CHF  Normal cardiovascular exam+ Valvular Problems/Murmurs AS  Rhythm:Regular Rate:Normal  ECHO: 1. The left ventricle has moderately reduced systolic function, with an ejection fraction of 35-40%. The cavity size was normal. There is moderately increased left ventricular wall thickness. Left ventricular diastolic Doppler parameters are consistent  with impaired relaxation. Elevated left atrial and left ventricular end-diastolic pressures The E/e' is >15.  2. Moderate akinesis of the left ventricular, entire inferior wall.  3. The right ventricle has normal systolic function. The cavity was normal. There is no increase in right ventricular wall thickness.  4. The mitral valve is abnormal. Mild thickening of the mitral valve leaflet. There is mild mitral annular calcification present.  5. The tricuspid valve is grossly normal.  6. The aortic valve is tricuspid. Moderate calcification of the aortic valve. Aortic valve regurgitation is mild by color flow Doppler. Moderate-severe stenosis of the aortic valve.  7. The inferior vena cava was dilated in size with <50% respiratory variability.  8. The interatrial septum was not assessed.  9. When compared to the prior study: 03/24/2018: LVEF 40-45%, moderate aortic stenosis - mean gradient 30 mmHg.  Lauree Chandler, MD is primary cardiologist   Neuro/Psych Anxiety negative neurological ROS     GI/Hepatic Neg liver ROS,  GERD  Medicated and Controlled,  Endo/Other  negative endocrine ROS  Renal/GU ESRF and DialysisRenal disease     Musculoskeletal negative musculoskeletal ROS (+)   Abdominal   Peds  Hematology  (+) anemia , HLD   Anesthesia Other Findings nonviable tissue  Reproductive/Obstetrics                           Anesthesia Physical Anesthesia Plan  ASA: IV  Anesthesia Plan: Regional   Post-op Pain Management:    Induction:   PONV Risk Score and Plan: 1 and Ondansetron, Dexamethasone, Propofol infusion and Treatment may vary due to age or medical condition  Airway Management Planned: Simple Face Mask  Additional Equipment:   Intra-op Plan:   Post-operative Plan:   Informed Consent: I have reviewed the patients History and Physical, chart, labs and discussed the procedure including the risks, benefits and alternatives for the proposed anesthesia with the patient or authorized representative who has indicated his/her understanding and acceptance.       Plan Discussed with: CRNA  Anesthesia Plan Comments: (Reviewed PAT note written 08/18/2018 by Myra Gianotti, PA-C. )      Anesthesia Quick Evaluation

## 2018-08-19 ENCOUNTER — Encounter (HOSPITAL_COMMUNITY)
Admission: RE | Disposition: A | Discharge status: Home / Self Care | Payer: Self-pay | Source: Home / Self Care | Attending: Vascular Surgery

## 2018-08-19 ENCOUNTER — Ambulatory Visit (HOSPITAL_COMMUNITY): Payer: Medicare Other | Admitting: Vascular Surgery

## 2018-08-19 ENCOUNTER — Observation Stay (HOSPITAL_COMMUNITY)
Admission: RE | Admit: 2018-08-19 | Discharge: 2018-08-20 | Disposition: A | Discharge status: Home / Self Care | Payer: Medicare Other | Attending: Vascular Surgery | Admitting: Vascular Surgery

## 2018-08-19 ENCOUNTER — Other Ambulatory Visit: Payer: Self-pay

## 2018-08-19 ENCOUNTER — Encounter (HOSPITAL_COMMUNITY): Payer: Self-pay

## 2018-08-19 DIAGNOSIS — F419 Anxiety disorder, unspecified: Secondary | ICD-10-CM | POA: Insufficient documentation

## 2018-08-19 DIAGNOSIS — E785 Hyperlipidemia, unspecified: Secondary | ICD-10-CM | POA: Diagnosis not present

## 2018-08-19 DIAGNOSIS — I2582 Chronic total occlusion of coronary artery: Secondary | ICD-10-CM | POA: Diagnosis not present

## 2018-08-19 DIAGNOSIS — I350 Nonrheumatic aortic (valve) stenosis: Secondary | ICD-10-CM | POA: Diagnosis not present

## 2018-08-19 DIAGNOSIS — Z7902 Long term (current) use of antithrombotics/antiplatelets: Secondary | ICD-10-CM | POA: Insufficient documentation

## 2018-08-19 DIAGNOSIS — Z79891 Long term (current) use of opiate analgesic: Secondary | ICD-10-CM | POA: Insufficient documentation

## 2018-08-19 DIAGNOSIS — I10 Essential (primary) hypertension: Secondary | ICD-10-CM | POA: Diagnosis not present

## 2018-08-19 DIAGNOSIS — D62 Acute posthemorrhagic anemia: Secondary | ICD-10-CM | POA: Insufficient documentation

## 2018-08-19 DIAGNOSIS — G629 Polyneuropathy, unspecified: Secondary | ICD-10-CM | POA: Diagnosis not present

## 2018-08-19 DIAGNOSIS — Z7982 Long term (current) use of aspirin: Secondary | ICD-10-CM | POA: Diagnosis not present

## 2018-08-19 DIAGNOSIS — I96 Gangrene, not elsewhere classified: Secondary | ICD-10-CM | POA: Diagnosis not present

## 2018-08-19 DIAGNOSIS — I739 Peripheral vascular disease, unspecified: Secondary | ICD-10-CM | POA: Diagnosis not present

## 2018-08-19 DIAGNOSIS — I252 Old myocardial infarction: Secondary | ICD-10-CM | POA: Insufficient documentation

## 2018-08-19 DIAGNOSIS — Z79899 Other long term (current) drug therapy: Secondary | ICD-10-CM | POA: Insufficient documentation

## 2018-08-19 DIAGNOSIS — K219 Gastro-esophageal reflux disease without esophagitis: Secondary | ICD-10-CM | POA: Insufficient documentation

## 2018-08-19 DIAGNOSIS — I2510 Atherosclerotic heart disease of native coronary artery without angina pectoris: Secondary | ICD-10-CM | POA: Diagnosis not present

## 2018-08-19 DIAGNOSIS — I5042 Chronic combined systolic (congestive) and diastolic (congestive) heart failure: Secondary | ICD-10-CM | POA: Diagnosis not present

## 2018-08-19 DIAGNOSIS — I132 Hypertensive heart and chronic kidney disease with heart failure and with stage 5 chronic kidney disease, or end stage renal disease: Secondary | ICD-10-CM | POA: Insufficient documentation

## 2018-08-19 DIAGNOSIS — Z87891 Personal history of nicotine dependence: Secondary | ICD-10-CM | POA: Diagnosis not present

## 2018-08-19 DIAGNOSIS — N186 End stage renal disease: Secondary | ICD-10-CM | POA: Insufficient documentation

## 2018-08-19 DIAGNOSIS — D631 Anemia in chronic kidney disease: Secondary | ICD-10-CM | POA: Diagnosis not present

## 2018-08-19 DIAGNOSIS — Z992 Dependence on renal dialysis: Secondary | ICD-10-CM | POA: Insufficient documentation

## 2018-08-19 DIAGNOSIS — Z95820 Peripheral vascular angioplasty status with implants and grafts: Secondary | ICD-10-CM | POA: Insufficient documentation

## 2018-08-19 DIAGNOSIS — G2581 Restless legs syndrome: Secondary | ICD-10-CM | POA: Diagnosis not present

## 2018-08-19 HISTORY — PX: TOE AMPUTATION: SHX809

## 2018-08-19 HISTORY — DX: Malignant (primary) neoplasm, unspecified: C80.1

## 2018-08-19 HISTORY — PX: AMPUTATION: SHX166

## 2018-08-19 HISTORY — DX: Pneumonia, unspecified organism: J18.9

## 2018-08-19 LAB — CBC
HCT: 33.9 % — ABNORMAL LOW (ref 39.0–52.0)
Hemoglobin: 10.5 g/dL — ABNORMAL LOW (ref 13.0–17.0)
MCH: 32 pg (ref 26.0–34.0)
MCHC: 31 g/dL (ref 30.0–36.0)
MCV: 103.4 fL — ABNORMAL HIGH (ref 80.0–100.0)
Platelets: 95 10*3/uL — ABNORMAL LOW (ref 150–400)
RBC: 3.28 MIL/uL — ABNORMAL LOW (ref 4.22–5.81)
RDW: 17.3 % — ABNORMAL HIGH (ref 11.5–15.5)
WBC: 4.7 10*3/uL (ref 4.0–10.5)
nRBC: 0 % (ref 0.0–0.2)

## 2018-08-19 LAB — BASIC METABOLIC PANEL
Anion gap: 17 — ABNORMAL HIGH (ref 5–15)
BUN: 28 mg/dL — ABNORMAL HIGH (ref 8–23)
CO2: 26 mmol/L (ref 22–32)
Calcium: 8.7 mg/dL — ABNORMAL LOW (ref 8.9–10.3)
Chloride: 94 mmol/L — ABNORMAL LOW (ref 98–111)
Creatinine, Ser: 7.12 mg/dL — ABNORMAL HIGH (ref 0.61–1.24)
GFR calc Af Amer: 8 mL/min — ABNORMAL LOW (ref >60)
GFR calc non Af Amer: 7 mL/min — ABNORMAL LOW (ref >60)
Glucose, Bld: 106 mg/dL — ABNORMAL HIGH (ref 70–99)
Potassium: 3.6 mmol/L (ref 3.5–5.1)
Sodium: 137 mmol/L (ref 135–145)

## 2018-08-19 SURGERY — AMPUTATION DIGIT
Anesthesia: Regional | Laterality: Left

## 2018-08-19 MED ORDER — DEXAMETHASONE SODIUM PHOSPHATE 10 MG/ML IJ SOLN
INTRAMUSCULAR | Status: AC
  Filled 2018-08-19: qty 1

## 2018-08-19 MED ORDER — FENTANYL CITRATE (PF) 100 MCG/2ML IJ SOLN
INTRAMUSCULAR | Status: DC | PRN
  Administered 2018-08-19 (×2): 50 ug via INTRAVENOUS

## 2018-08-19 MED ORDER — BUPIVACAINE HCL (PF) 0.5 % IJ SOLN
INTRAMUSCULAR | Status: DC | PRN
  Administered 2018-08-19: 40 mL

## 2018-08-19 MED ORDER — MORPHINE SULFATE (PF) 2 MG/ML IV SOLN: 2.0000 mg | INTRAVENOUS | Status: DC | PRN

## 2018-08-19 MED ORDER — ONDANSETRON HCL 4 MG/2ML IJ SOLN: 4.0000 mg | Freq: Once | INTRAMUSCULAR | Status: DC | PRN

## 2018-08-19 MED ORDER — PANTOPRAZOLE SODIUM 40 MG PO TBEC
40.0000 mg | DELAYED_RELEASE_TABLET | Freq: Every day | ORAL | Status: DC
  Administered 2018-08-19 – 2018-08-20 (×2): 40 mg via ORAL
  Filled 2018-08-19 (×2): qty 1

## 2018-08-19 MED ORDER — PROPOFOL 10 MG/ML IV BOLUS
INTRAVENOUS | Status: DC | PRN
  Administered 2018-08-19: 20 mg via INTRAVENOUS

## 2018-08-19 MED ORDER — EPHEDRINE 5 MG/ML INJ
INTRAVENOUS | Status: AC
  Filled 2018-08-19: qty 10

## 2018-08-19 MED ORDER — GUAIFENESIN-DM 100-10 MG/5ML PO SYRP: 15.0000 mL | ORAL_SOLUTION | ORAL | Status: DC | PRN

## 2018-08-19 MED ORDER — HYDRALAZINE HCL 20 MG/ML IJ SOLN: 5.0000 mg | INTRAMUSCULAR | Status: DC | PRN

## 2018-08-19 MED ORDER — OXYCODONE HCL 5 MG PO TABS
5.0000 mg | ORAL_TABLET | ORAL | Status: DC | PRN
  Administered 2018-08-19: 5 mg via ORAL
  Administered 2018-08-20: 09:00:00 10 mg via ORAL
  Filled 2018-08-19: qty 2

## 2018-08-19 MED ORDER — LABETALOL HCL 5 MG/ML IV SOLN
INTRAVENOUS | Status: AC
  Filled 2018-08-19: qty 4

## 2018-08-19 MED ORDER — POTASSIUM CHLORIDE CRYS ER 20 MEQ PO TBCR
20.0000 meq | EXTENDED_RELEASE_TABLET | Freq: Once | ORAL | Status: AC
  Administered 2018-08-19: 11:00:00 20 meq via ORAL
  Filled 2018-08-19: qty 1

## 2018-08-19 MED ORDER — FENTANYL CITRATE (PF) 250 MCG/5ML IJ SOLN
INTRAMUSCULAR | Status: AC
  Filled 2018-08-19: qty 5

## 2018-08-19 MED ORDER — CEFAZOLIN SODIUM-DEXTROSE 2-4 GM/100ML-% IV SOLN
2.0000 g | INTRAVENOUS | Status: AC
  Administered 2018-08-19: 2 g via INTRAVENOUS
  Filled 2018-08-19: qty 100

## 2018-08-19 MED ORDER — LABETALOL HCL 5 MG/ML IV SOLN: 10.0000 mg | INTRAVENOUS | Status: DC | PRN

## 2018-08-19 MED ORDER — FENTANYL CITRATE (PF) 100 MCG/2ML IJ SOLN: 25.0000 ug | INTRAMUSCULAR | Status: DC | PRN

## 2018-08-19 MED ORDER — PHENYLEPHRINE 40 MCG/ML (10ML) SYRINGE FOR IV PUSH (FOR BLOOD PRESSURE SUPPORT)
PREFILLED_SYRINGE | INTRAVENOUS | Status: AC
  Filled 2018-08-19: qty 10

## 2018-08-19 MED ORDER — SODIUM CHLORIDE 0.9 % IV SOLN
INTRAVENOUS | Status: DC
  Administered 2018-08-19: 07:00:00 via INTRAVENOUS

## 2018-08-19 MED ORDER — PHENOL 1.4 % MT LIQD: 1.0000 | OROMUCOSAL | Status: DC | PRN

## 2018-08-19 MED ORDER — MIDAZOLAM HCL 2 MG/2ML IJ SOLN
INTRAMUSCULAR | Status: AC
  Filled 2018-08-19: qty 2

## 2018-08-19 MED ORDER — HEPARIN SODIUM (PORCINE) 1000 UNIT/ML IJ SOLN
INTRAMUSCULAR | Status: AC
  Filled 2018-08-19: qty 2

## 2018-08-19 MED ORDER — SUCCINYLCHOLINE CHLORIDE 200 MG/10ML IV SOSY
PREFILLED_SYRINGE | INTRAVENOUS | Status: AC
  Filled 2018-08-19: qty 10

## 2018-08-19 MED ORDER — 0.9 % SODIUM CHLORIDE (POUR BTL) OPTIME
TOPICAL | Status: DC | PRN
  Administered 2018-08-19: 08:00:00 1000 mL

## 2018-08-19 MED ORDER — ONDANSETRON HCL 4 MG/2ML IJ SOLN
INTRAMUSCULAR | Status: DC | PRN
  Administered 2018-08-19: 4 mg via INTRAVENOUS

## 2018-08-19 MED ORDER — ALUM & MAG HYDROXIDE-SIMETH 200-200-20 MG/5ML PO SUSP: 15.0000 mL | ORAL | Status: DC | PRN

## 2018-08-19 MED ORDER — ONDANSETRON HCL 4 MG/2ML IJ SOLN
INTRAMUSCULAR | Status: AC
  Filled 2018-08-19: qty 2

## 2018-08-19 MED ORDER — ACETAMINOPHEN 500 MG PO TABS
500.0000 mg | ORAL_TABLET | Freq: Once | ORAL | Status: AC
  Administered 2018-08-19: 07:00:00 500 mg via ORAL
  Filled 2018-08-19: qty 1

## 2018-08-19 MED ORDER — LIDOCAINE HCL (PF) 1 % IJ SOLN
INTRAMUSCULAR | Status: AC
  Filled 2018-08-19: qty 30

## 2018-08-19 MED ORDER — PROPOFOL 10 MG/ML IV BOLUS
INTRAVENOUS | Status: AC
  Filled 2018-08-19: qty 20

## 2018-08-19 MED ORDER — HEPARIN SODIUM (PORCINE) 5000 UNIT/ML IJ SOLN
5000.0000 [IU] | Freq: Three times a day (TID) | INTRAMUSCULAR | Status: DC
  Administered 2018-08-19 – 2018-08-20 (×2): 5000 [IU] via SUBCUTANEOUS
  Filled 2018-08-19 (×2): qty 1

## 2018-08-19 MED ORDER — METOPROLOL TARTRATE 5 MG/5ML IV SOLN: 2.0000 mg | INTRAVENOUS | Status: DC | PRN

## 2018-08-19 MED ORDER — EPHEDRINE SULFATE-NACL 50-0.9 MG/10ML-% IV SOSY
PREFILLED_SYRINGE | INTRAVENOUS | Status: DC | PRN
  Administered 2018-08-19 (×2): 5 mg via INTRAVENOUS

## 2018-08-19 MED ORDER — ONDANSETRON HCL 4 MG/2ML IJ SOLN
4.0000 mg | Freq: Four times a day (QID) | INTRAMUSCULAR | Status: DC | PRN
  Administered 2018-08-19 – 2018-08-20 (×2): 4 mg via INTRAVENOUS
  Filled 2018-08-19 (×2): qty 2

## 2018-08-19 MED ORDER — PROPOFOL 500 MG/50ML IV EMUL
INTRAVENOUS | Status: DC | PRN
  Administered 2018-08-19: 50 ug/kg/min via INTRAVENOUS

## 2018-08-19 MED ORDER — PHENYLEPHRINE 40 MCG/ML (10ML) SYRINGE FOR IV PUSH (FOR BLOOD PRESSURE SUPPORT)
PREFILLED_SYRINGE | INTRAVENOUS | Status: DC | PRN
  Administered 2018-08-19: 40 ug via INTRAVENOUS
  Administered 2018-08-19: 120 ug via INTRAVENOUS
  Administered 2018-08-19 (×3): 80 ug via INTRAVENOUS

## 2018-08-19 SURGICAL SUPPLY — 36 items
BANDAGE ACE 4X5 VEL STRL LF (GAUZE/BANDAGES/DRESSINGS) ×3 IMPLANT
BLADE AVERAGE 25MMX9MM (BLADE)
BLADE AVERAGE 25X9 (BLADE) IMPLANT
BLADE SAW SGTL 81X20 HD (BLADE) IMPLANT
BNDG GAUZE ELAST 4 BULKY (GAUZE/BANDAGES/DRESSINGS) ×3 IMPLANT
CANISTER SUCT 3000ML PPV (MISCELLANEOUS) ×3 IMPLANT
COVER SURGICAL LIGHT HANDLE (MISCELLANEOUS) ×3 IMPLANT
COVER WAND RF STERILE (DRAPES) ×1 IMPLANT
DRAPE EXTREMITY T 121X128X90 (DISPOSABLE) ×3 IMPLANT
DRAPE HALF SHEET 40X57 (DRAPES) ×1 IMPLANT
DRSG ADAPTIC 3X8 NADH LF (GAUZE/BANDAGES/DRESSINGS) ×2 IMPLANT
ELECT REM PT RETURN 9FT ADLT (ELECTROSURGICAL) ×3
ELECTRODE REM PT RTRN 9FT ADLT (ELECTROSURGICAL) ×1 IMPLANT
GAUZE SPONGE 4X4 12PLY STRL (GAUZE/BANDAGES/DRESSINGS) ×3 IMPLANT
GAUZE SPONGE 4X4 12PLY STRL LF (GAUZE/BANDAGES/DRESSINGS) ×2 IMPLANT
GLOVE BIO SURGEON STRL SZ7.5 (GLOVE) ×3 IMPLANT
GLOVE BIOGEL PI IND STRL 6.5 (GLOVE) IMPLANT
GLOVE BIOGEL PI INDICATOR 6.5 (GLOVE) ×6
GOWN STRL REUS W/ TWL LRG LVL3 (GOWN DISPOSABLE) ×2 IMPLANT
GOWN STRL REUS W/ TWL XL LVL3 (GOWN DISPOSABLE) ×1 IMPLANT
GOWN STRL REUS W/TWL LRG LVL3 (GOWN DISPOSABLE) ×3
GOWN STRL REUS W/TWL XL LVL3 (GOWN DISPOSABLE) ×3
KIT BASIN OR (CUSTOM PROCEDURE TRAY) ×3 IMPLANT
KIT TURNOVER KIT B (KITS) ×3 IMPLANT
NDL HYPO 25GX1X1/2 BEV (NEEDLE) IMPLANT
NEEDLE HYPO 25GX1X1/2 BEV (NEEDLE) IMPLANT
NS IRRIG 1000ML POUR BTL (IV SOLUTION) ×3 IMPLANT
PACK GENERAL/GYN (CUSTOM PROCEDURE TRAY) ×3 IMPLANT
PAD ARMBOARD 7.5X6 YLW CONV (MISCELLANEOUS) ×4 IMPLANT
SUT ETHILON 3 0 PS 1 (SUTURE) ×5 IMPLANT
SUT VIC AB 2-0 CT1 27 (SUTURE) ×3
SUT VIC AB 2-0 CT1 TAPERPNT 27 (SUTURE) IMPLANT
SYR CONTROL 10ML LL (SYRINGE) IMPLANT
TOWEL GREEN STERILE (TOWEL DISPOSABLE) ×4 IMPLANT
UNDERPAD 30X30 (UNDERPADS AND DIAPERS) ×3 IMPLANT
WATER STERILE IRR 1000ML POUR (IV SOLUTION) ×1 IMPLANT

## 2018-08-19 NOTE — H&P (Signed)
   History and Physical Update  The patient was interviewed and re-examined.  The patient's previous History and Physical has been reviewed and is unchanged from recent office visit. Plan for amputation of left 2nd and 3rd toes. Ankle block to be placed by anesthesia and will admit for overnight observation.  Lynnette Pote C. Donzetta Matters, MD [facility] [place] [place] Office: [phone] Pager: [phone]   08/19/2018, 7:23 AM

## 2018-08-19 NOTE — Transfer of Care (Signed)
Immediate Anesthesia Transfer of Care Note  Patient: Jose Garrett  Procedure(s) Performed: AMPUTATION LEFT TOES SECOND AND THIRD (Left )  Patient Location: PACU  Anesthesia Type:MAC  Level of Consciousness: awake and patient cooperative  Airway & Oxygen Therapy: Patient Spontanous Breathing and Patient connected to nasal cannula oxygen  Post-op Assessment: Report given to RN, Post -op Vital signs reviewed and stable and Patient moving all extremities X 4  Post vital signs: Reviewed and stable  Last Vitals:  Vitals Value Taken Time  BP 96/58 08/19/2018  8:36 AM  Temp    Pulse 88 08/19/2018  8:37 AM  Resp 18 08/19/2018  8:37 AM  SpO2 93 % 08/19/2018  8:37 AM  Vitals shown include unvalidated device data.  Last Pain:  Vitals:   08/19/18 0654  TempSrc:   PainSc: 0-No pain         Complications: No apparent anesthesia complications

## 2018-08-19 NOTE — Progress Notes (Signed)
Orthopedic Tech Progress Note Patient Details:  Jose Garrett [birth year] [id number]  Ortho Devices Type of Ortho Device: Postop shoe/boot Ortho Device/Splint Location: LLE Ortho Device/Splint Interventions: Adjustment, Application, Ordered   Post Interventions Patient Tolerated: Well Instructions Provided: Care of device, Adjustment of device   [name] 08/19/2018, 9:58 AM

## 2018-08-19 NOTE — Op Note (Signed)
    Patient name: Jose Garrett MRN: [id number] DOB: [birth year] Sex: male  08/19/2018 Pre-operative Diagnosis: Gangrenous left second and third toes Post-operative diagnosis:  Same Surgeon:  Eda Paschal. Donzetta Matters, MD Procedure Performed: Left second and third toe amputations  Indications: 68 year old male is previously undergone left femoropopliteal bypass.  Postoperatively he had non-ST elevation MI with elevated troponin underwent cardiac cath.  Does need a right femoropopliteal bypass for toe discoloration rest pain however we have held that.  Left side he has progressive gangrenous changes of the second and third toe is indicated for amputation.  Findings: There was adequate bleeding to suggest that the toe amputation site will heal.   Procedure:  The patient was identified in the holding area and taken to the operating room was placed supine operative table.  An ankle block of previous been placed by anesthesia preoperative holding area.  He was sterilely prepped draped in the left lower extremity usual fashion antibiotics were administered and timeout was called.  We tested the block patient cannot feel his foot.  Circular incision was made around both affected toes.  We took him back to the first metatarsal phalangeal joint space and remove the toes.  Took off the cap of each metatarsal bone with rongeur.  We irrigated the wound obtain hemostasis closed the deep layer with interrupted Vicryl and superficially with interrupted 3-0 nylon sutures.  He was then allowed awaken anesthesia and tolerated procedure well without any complication.  EBL: 10 cc    Marlo Goodrich C. Donzetta Matters, MD [facility] [place] Office: [phone] Pager: [phone]

## 2018-08-19 NOTE — Anesthesia Postprocedure Evaluation (Signed)
Anesthesia Post Note  Patient: Jose Garrett  Procedure(s) Performed: AMPUTATION LEFT TOES SECOND AND THIRD (Left )     Patient location during evaluation: PACU Anesthesia Type: Regional Level of consciousness: awake and alert Pain management: pain level controlled Vital Signs Assessment: post-procedure vital signs reviewed and stable Respiratory status: spontaneous breathing, nonlabored ventilation, respiratory function stable and patient connected to nasal cannula oxygen Cardiovascular status: stable and blood pressure returned to baseline Postop Assessment: no apparent nausea or vomiting Anesthetic complications: no    Last Vitals:  Vitals:   08/19/18 1110 08/19/18 1543  BP: 93/60 (!) 97/55  Pulse: 83 87  Resp: 19 18  Temp: 36.9 C 37.1 C  SpO2: 98% 99%    Last Pain:  Vitals:   08/19/18 1543  TempSrc: Oral  PainSc:                  Karyl Kinnier [place]

## 2018-08-19 NOTE — TOC Initial Note (Signed)
Transition of Care [facility]) - Initial/Assessment Note    Patient Details  Name: Jose Garrett MRN: [id number] Date of Birth: 07/20/1950  [facility] CM/SW Contact:    Bartholomew Crews, RN Phone Number: 08/19/2018, 12:29 PM  Clinical Narrative:                 Received message from [facility] liaison that patient is active for PT and OT. Services will resume when patient transitions home.   Expected Discharge Plan: [place] Barriers to Discharge: Continued Medical Work up   Patient Goals and CMS Choice   CMS Medicare.gov Compare Post Acute Care list provided to:: Patient Choice offered to / list presented to : Patient  Expected Discharge Plan and Services Expected Discharge Plan: [place] In-house Referral: NA Discharge Planning Services: CM Consult Post Acute Care Choice: Resumption of Svcs/PTA Provider                   DME Arranged: N/A DME Agency: NA       HH Arranged: OT, PT   Date HH Agency Contacted: 08/19/18 Time HH Agency Contacted: [phone] Representative spoke with at [place]: Linna Hoff  Prior Living Arrangements/Services   Lives with:: Self Patient language and need for interpreter reviewed:: Yes            Current home services: Home PT, Home OT Criminal Activity/Legal Involvement Pertinent to Current Situation/Hospitalization: No - Comment as needed  Activities of Daily Living Home Assistive Devices/Equipment: Eyeglasses ADL Screening (condition at time of admission) Patient's cognitive ability adequate to safely complete daily activities?: Yes Is the patient deaf or have difficulty hearing?: No Does the patient have difficulty seeing, even when wearing glasses/contacts?: No Does the patient have difficulty concentrating, remembering, or making decisions?: No Patient able to express need for assistance with ADLs?: Yes Does the patient have difficulty dressing or bathing?: No Independently performs ADLs?: Yes  (appropriate for developmental age) Does the patient have difficulty walking or climbing stairs?: Yes Weakness of Legs: None Weakness of Arms/Hands: None  Permission Sought/Granted                  Emotional Assessment       Orientation: : Oriented to Self, Oriented to  Time, Oriented to Place, Oriented to Situation      Admission diagnosis:  nonviable tissue Patient Active Problem List   Diagnosis Date Noted  . Leukocytosis   . Chronic diastolic congestive heart failure ([place])   . Gastroesophageal reflux disease   . Coronary artery disease involving native coronary artery of native heart without angina pectoris   . Drug induced constipation   . Obstipation   . Acute on chronic anemia   . Thrombocytopenia ([place])   . Chronic combined systolic and diastolic congestive heart failure ([place])   . ESRD on dialysis ([place])   . Hemodialysis-associated hypotension   . Acute blood loss anemia   . Debility 07/28/2018  . PAD (peripheral artery disease) ([place]) 07/22/2018  . Shortness of breath 03/25/2018  . Chest pain   . Dyspnea 03/23/2018  . Acute on chronic combined systolic and diastolic CHF (congestive heart failure) ([place]) 11/30/2017  . Non-ST elevation (NSTEMI) myocardial infarction ([place])   . Elevated troponin 11/25/2017  . Respiratory failure ([place])   . Neuropathy 03/14/2017  . Fever   . CKD (chronic kidney disease) stage 5, GFR less than 15 ml/min (HCC)   . ESRD (end stage renal  disease) on dialysis ([place])   . Acute renal failure superimposed on chronic kidney disease ([place])   . Hypervolemia   . Acute diastolic (congestive) heart failure ([place]) 01/23/2016  . Fluid overload, unspecified 01/23/2016  . Acute pulmonary edema ([place]) 01/23/2016  . Skin tear of right forearm without complication 82/50/5397  . Iron deficiency anemia 11/22/2015  . Nonrheumatic aortic valve stenosis 08/22/2015  . Chronic kidney disease with dialysis modality undecided, stage 5 ([place]): Progressive  08/01/2015  . B12 deficiency 07/19/2015  . Seasonal allergies 06/30/2015  . Back pain 03/18/2015  . Hip pain 02/18/2015  . AL amyloidosis ([place]) 09/25/2014  . Anemia of chronic disease 09/25/2014  . Abnormal bruising 09/25/2014  . Renal hematoma, right 09/08/2014  . Chronic diastolic heart failure, NYHA class 1 ([place]) 09/08/2014  . Hematuria   . Proteinuria   . Vitamin D deficiency 01/21/2014  . Hypertriglyceridemia 01/21/2014  . Essential hypertension, benign 11/06/2013  . Dependent edema 11/06/2013  . DOE (dyspnea on exertion) 11/06/2013  . Other malaise and fatigue 11/06/2013   PCP:  Patient, No Pcp Per Pharmacy:   [place] [place] [address] Phone: [phone] Fax: [phone]     Social Determinants of Health (SDOH) Interventions    Readmission Risk Interventions No flowsheet data found.

## 2018-08-19 NOTE — Anesthesia Procedure Notes (Signed)
Anesthesia Regional Block: Ankle block   Pre-Anesthetic Checklist: ,, timeout performed, Correct Patient, Correct Site, Correct Laterality, Correct Procedure, Correct Position, site marked, Risks and benefits discussed,  Surgical consent,  Pre-op evaluation,  At surgeon's request and post-op pain management  Laterality: Left and Lower  Prep: chloraprep       Needles:  Injection technique: Single-shot  Needle Type: Quincke     Needle Length: 4cm  Needle Gauge: 25     Additional Needles:   (perineural infiltration)  Narrative:  Start time: 08/19/2018 7:19 AM End time: 08/19/2018 7:26 AM Injection made incrementally with aspirations every 5 mL.  Performed by: Personally  Anesthesiologist: Annye Asa, MD  Additional Notes: Pt identified in Holding room.  Monitors applied. Working IV access confirmed. Sterile prep, drape L ankle.  #25ga Quincke, perineural infiltration around deep peroneal, sup peroneal, saphenous, sural, post tib .  40cc 0.5% Bupivacaine injected incrementally.  Patient asymptomatic, VSS, no heme aspirated, tolerated well.  Jenita Seashore, MD

## 2018-08-20 ENCOUNTER — Encounter (HOSPITAL_COMMUNITY): Payer: Self-pay | Admitting: Vascular Surgery

## 2018-08-20 DIAGNOSIS — I96 Gangrene, not elsewhere classified: Secondary | ICD-10-CM | POA: Diagnosis not present

## 2018-08-20 DIAGNOSIS — I2510 Atherosclerotic heart disease of native coronary artery without angina pectoris: Secondary | ICD-10-CM | POA: Diagnosis not present

## 2018-08-20 DIAGNOSIS — Z95820 Peripheral vascular angioplasty status with implants and grafts: Secondary | ICD-10-CM | POA: Diagnosis not present

## 2018-08-20 DIAGNOSIS — Z87891 Personal history of nicotine dependence: Secondary | ICD-10-CM | POA: Diagnosis not present

## 2018-08-20 DIAGNOSIS — N186 End stage renal disease: Secondary | ICD-10-CM | POA: Diagnosis not present

## 2018-08-20 DIAGNOSIS — D631 Anemia in chronic kidney disease: Secondary | ICD-10-CM | POA: Diagnosis not present

## 2018-08-20 DIAGNOSIS — D509 Iron deficiency anemia, unspecified: Secondary | ICD-10-CM | POA: Diagnosis not present

## 2018-08-20 DIAGNOSIS — N2581 Secondary hyperparathyroidism of renal origin: Secondary | ICD-10-CM | POA: Diagnosis not present

## 2018-08-20 DIAGNOSIS — I132 Hypertensive heart and chronic kidney disease with heart failure and with stage 5 chronic kidney disease, or end stage renal disease: Secondary | ICD-10-CM | POA: Diagnosis not present

## 2018-08-20 LAB — CBC
HCT: 29.9 % — ABNORMAL LOW (ref 39.0–52.0)
Hemoglobin: 9.5 g/dL — ABNORMAL LOW (ref 13.0–17.0)
MCH: 32.1 pg (ref 26.0–34.0)
MCHC: 31.8 g/dL (ref 30.0–36.0)
MCV: 101 fL — ABNORMAL HIGH (ref 80.0–100.0)
Platelets: 79 10*3/uL — ABNORMAL LOW (ref 150–400)
RBC: 2.96 MIL/uL — ABNORMAL LOW (ref 4.22–5.81)
RDW: 16.9 % — ABNORMAL HIGH (ref 11.5–15.5)
WBC: 5.8 10*3/uL (ref 4.0–10.5)
nRBC: 0 % (ref 0.0–0.2)

## 2018-08-20 LAB — BASIC METABOLIC PANEL
Anion gap: 15 (ref 5–15)
BUN: 36 mg/dL — ABNORMAL HIGH (ref 8–23)
CO2: 27 mmol/L (ref 22–32)
Calcium: 8.3 mg/dL — ABNORMAL LOW (ref 8.9–10.3)
Chloride: 94 mmol/L — ABNORMAL LOW (ref 98–111)
Creatinine, Ser: 8.65 mg/dL — ABNORMAL HIGH (ref 0.61–1.24)
GFR calc Af Amer: 7 mL/min — ABNORMAL LOW (ref >60)
GFR calc non Af Amer: 6 mL/min — ABNORMAL LOW (ref >60)
Glucose, Bld: 109 mg/dL — ABNORMAL HIGH (ref 70–99)
Potassium: 4.5 mmol/L (ref 3.5–5.1)
Sodium: 136 mmol/L (ref 135–145)

## 2018-08-20 MED ORDER — OXYCODONE HCL 5 MG PO TABS: 5.0000 mg | ORAL_TABLET | ORAL | 0 refills | Status: DC | PRN

## 2018-08-20 NOTE — Progress Notes (Signed)
[name] to be D/C'd  per MD order. Discussed with the patient and all questions fully answered.  VSS, Skin clean, dry and intact without evidence of skin break down, no evidence of skin tears noted.  IV catheter discontinued intact. Site without signs and symptoms of complications. Dressing and pressure applied.  An After Visit Summary was printed and given to the patient. Patient received prescription.  D/c education completed with patient/family including follow up instructions, medication list, d/c activities limitations if indicated, with other d/c instructions as indicated by MD - patient able to verbalize understanding, all questions fully answered.   Patient instructed to return to ED, call 911, or call MD for any changes in condition.   Patient to be escorted via [place], and D/C home via private auto.

## 2018-08-20 NOTE — Progress Notes (Addendum)
     Subjective  - Doing well over all.   Objective 105/69 97 98.6 F (37 C) (Oral) 18 97%  Intake/Output Summary (Last 24 hours) at 08/20/2018 0800 Last data filed at 08/19/2018 0825 Gross per 24 hour  Intake 300 ml  Output 5 ml  Net 295 ml    Dressing removed, incision healing well.  Clean dry dressing applied. Left groin with firm, non expanding incisional area.  No ecchymosis or erythema.   LE popliteal incision healing well  Assessment/Planning: POD # 1 2nd and third toe amputation site healing well.  Post op shoe in room.  Plan discharge home today he has HD at 12 noon.    Dry dressing as needed to left foot amputation area. F/U with DR. Donzetta Matters or PA in 2-3 weeks for suture removal.  Roxy Horseman 08/20/2018 8:00 AM --  Laboratory Lab Results: Recent Labs    08/19/18 0640 08/20/18 0113  WBC 4.7 5.8  HGB 10.5* 9.5*  HCT 33.9* 29.9*  PLT 95* 79*   BMET Recent Labs    08/19/18 0637 08/20/18 0113  NA 137 136  K 3.6 4.5  CL 94* 94*  CO2 26 27  GLUCOSE 106* 109*  BUN 28* 36*  CREATININE 7.12* 8.65*  CALCIUM 8.7* 8.3*    COAG Lab Results  Component Value Date   INR 1.2 07/22/2018   INR 1.27 01/23/2016   INR 1.10 (L) 01/21/2015   PROTIME 13.2 01/21/2015   Lab Results  Component Value Date   PTT 48 (H) 10/11/2014     I have interviewed and examined patient with PA and agree with assessment and plan above.   Daylin Eads C. Donzetta Matters, MD [facility] Office: [phone] Pager: [phone]

## 2018-08-20 NOTE — Care Management Obs Status (Signed)
[place] NOTIFICATION   Patient Details  Name: Jose Garrett MRN: [id number] Date of Birth: [birth year]   Medicare Observation Status Notification Given:  Yes    Ella Bodo, RN 08/20/2018, 10:40 AM

## 2018-08-21 ENCOUNTER — Telehealth: Payer: Self-pay | Admitting: Physical Medicine & Rehabilitation

## 2018-08-21 ENCOUNTER — Other Ambulatory Visit: Payer: Self-pay

## 2018-08-21 ENCOUNTER — Ambulatory Visit: Payer: Medicare Other | Attending: Family Medicine | Admitting: Family Medicine

## 2018-08-21 ENCOUNTER — Encounter: Payer: Self-pay | Admitting: Family Medicine

## 2018-08-21 VITALS — BP 94/57 | HR 98 | Temp 98.4°F | Ht 70.0 in | Wt 184.0 lb

## 2018-08-21 DIAGNOSIS — R0989 Other specified symptoms and signs involving the circulatory and respiratory systems: Secondary | ICD-10-CM

## 2018-08-21 DIAGNOSIS — L0889 Other specified local infections of the skin and subcutaneous tissue: Secondary | ICD-10-CM

## 2018-08-21 DIAGNOSIS — Z48812 Encounter for surgical aftercare following surgery on the circulatory system: Secondary | ICD-10-CM | POA: Diagnosis not present

## 2018-08-21 DIAGNOSIS — I7389 Other specified peripheral vascular diseases: Secondary | ICD-10-CM

## 2018-08-21 DIAGNOSIS — S98132A Complete traumatic amputation of one left lesser toe, initial encounter: Secondary | ICD-10-CM

## 2018-08-21 DIAGNOSIS — Z95828 Presence of other vascular implants and grafts: Secondary | ICD-10-CM | POA: Diagnosis not present

## 2018-08-21 DIAGNOSIS — L089 Local infection of the skin and subcutaneous tissue, unspecified: Secondary | ICD-10-CM

## 2018-08-21 DIAGNOSIS — L988 Other specified disorders of the skin and subcutaneous tissue: Secondary | ICD-10-CM | POA: Diagnosis not present

## 2018-08-21 DIAGNOSIS — R5381 Other malaise: Secondary | ICD-10-CM | POA: Diagnosis not present

## 2018-08-21 DIAGNOSIS — I350 Nonrheumatic aortic (valve) stenosis: Secondary | ICD-10-CM | POA: Diagnosis not present

## 2018-08-21 DIAGNOSIS — D638 Anemia in other chronic diseases classified elsewhere: Secondary | ICD-10-CM

## 2018-08-21 DIAGNOSIS — L989 Disorder of the skin and subcutaneous tissue, unspecified: Secondary | ICD-10-CM

## 2018-08-21 DIAGNOSIS — N186 End stage renal disease: Secondary | ICD-10-CM

## 2018-08-21 DIAGNOSIS — I132 Hypertensive heart and chronic kidney disease with heart failure and with stage 5 chronic kidney disease, or end stage renal disease: Secondary | ICD-10-CM | POA: Diagnosis not present

## 2018-08-21 DIAGNOSIS — I214 Non-ST elevation (NSTEMI) myocardial infarction: Secondary | ICD-10-CM | POA: Diagnosis not present

## 2018-08-21 DIAGNOSIS — Z992 Dependence on renal dialysis: Secondary | ICD-10-CM

## 2018-08-21 DIAGNOSIS — I5042 Chronic combined systolic (congestive) and diastolic (congestive) heart failure: Secondary | ICD-10-CM | POA: Diagnosis not present

## 2018-08-21 DIAGNOSIS — I739 Peripheral vascular disease, unspecified: Secondary | ICD-10-CM

## 2018-08-21 DIAGNOSIS — Z89422 Acquired absence of other left toe(s): Secondary | ICD-10-CM

## 2018-08-21 DIAGNOSIS — I2510 Atherosclerotic heart disease of native coronary artery without angina pectoris: Secondary | ICD-10-CM | POA: Diagnosis not present

## 2018-08-21 MED ORDER — AMOXICILLIN-POT CLAVULANATE 500-125 MG PO TABS: ORAL_TABLET | ORAL | 0 refills | Status: DC

## 2018-08-21 NOTE — Telephone Encounter (Signed)
Stacy PT with [facility] needs to get verbal orders to see patient 1w1; 2w3, please call her at [phone] if no answer, please leave message it is a confidential voicemail.

## 2018-08-21 NOTE — Telephone Encounter (Signed)
Approval given.  He was briefly in hospital again for toe removal but HH was in progress from our discharge from inpt rehab.

## 2018-08-21 NOTE — Progress Notes (Signed)
Hospital f/u for a stent bypass  Toes removed on the left side

## 2018-08-21 NOTE — Progress Notes (Signed)
New Patient Office Visit  Subjective:  Patient ID: Jose Garrett, male    DOB: Sep 09, 1950  Age: 68 y.o. MRN: [id number]  CC:  Chief Complaint  Patient presents with  . Hospitalization Follow-up    HPI [name] presents to establish care following recent hospitalization.  Patient was initially hospitalized Jul 22, 2018 due to progressive discoloration and ischemia in the toes of the left foot and he underwent noninvasive studies which detected noncompressible vessels with no flow in the left toes and dampened flow in the right toes and patient underwent left common femoral to below-knee popliteal artery bypass on 07/22/2018 per Dr. Servando Snare.  Patient however had a NSTEMI during hospitalization as well as hemoglobin which was noted to be between 6.8-7.3 and a potassium of 5.9.  He was transfused with 1 unit of packed red blood cells and underwent cardiac catheterization on 07/24/2018 which showed chronic total occlusion of the RCA with left-to-right collaterals.  Patient with echocardiogram with ejection fraction of 40% with reduced systolic function.  He was placed on IV heparin and transition to aspirin and Plavix.  Patient was then admitted to inpatient rehabilitation due to debility.  He was discharged from inpatient rehabilitation on 08/13/2018.  After following up with vascular surgery as an outpatient, he was readmitted on 08/19/2018 05/26/2018 to have surgery on nonviable tissue on the left foot.  Patient underwent surgery using an ankle block on 08/19/2018 for amputation of the left second and third toes.  He reports that he has not yet had wound care follow-up status post his surgery.  He is accompanied by [name] he states that she change the dressing on the patient's foot last night.  Patient does continue to have some redness and then, bloody type drainage from the wound site per granddaughter.  Patient also had outpatient hematology follow-up yesterday.  Patient reports  that yesterday he had an episode of not feeling very well as if he was having chills, nausea and weakness but this resolved within half an hour.  He still however feels very tired.  He has not felt as if he has had any fever.  He does have pain in the left foot and is on narcotic medication and has had issues in the past with constipation due to medication.  He also does have some mild increase in lower extremity edema but denies any significant increase in shortness of breath.  He reports that he also has chronic renal failure and will have dialysis again tomorrow.  He does feel better than with his initial presentation to the hospital back in May.  He does however have some dizziness/lightheadedness currently when making transitions to get up from a seated position and transfer to chair, bed or toilet.  He denies any chest pain or palpitations status post his heart catheterization.  He is taking the Plavix and aspirin and has had no unusual bleeding but does tend to bruise easily.  On review of chart, patient does have multiple chronic serious medical issues including amyloidosis, aortic stenosis, CAD, chronic combined systolic and diastolic CHF, end-stage renal disease requiring dialysis which he receives on Monday Wednesday and Friday, GERD, hyperlipidemia, hypertension, neuropathy, and peripheral vascular disease.  Patient reports no active bleeding, he did see hematology yesterday and had blood work and does not wish to be stuck again today if possible.  Past Medical History:  Diagnosis Date  . Amyloidosis ([place])   . Anemia Dx 2016  . Anxiety    "  especially with procedure"- 07/21/2018  . Aortic stenosis   . CAD (coronary artery disease)    a. cardiac cath on 11/25/17 with occlusion of the RCA which could have been his event but the RCA could not be engaged. The RCA filled distally from left to right collaterals. There were no severe stenoses in the left coronary system. Medical therapy recommended,  started on plavix.  . Cancer ([place])   . Chronic combined systolic and diastolic CHF (congestive heart failure) ([place])    a. EF dropped to 35-40% with grade 2 DD by echo 11/2017.  [name] Diarrhea   . ESRD (end stage renal disease) ([place])    ESRD- HEMO MWF- [facility]  . Family history of adverse reaction to anesthesia    "my daughter can't take certain anesthesia agents" (09/08/2014)  . GERD (gastroesophageal reflux disease)   . Heart murmur   . History of blood transfusion 09/08/2014   "got hematoma after renal biopsy & HgB dropped"  . Hyperlipidemia Dx 2012  . Hypertension Dx 2012  . Myocardial infarction ([place]) 11/2017  . Neuropathy   . Peripheral vascular disease ([place])   . Pneumonia   . Restless legs     Past Surgical History:  Procedure Laterality Date  . ABDOMINAL AORTOGRAM W/LOWER EXTREMITY Bilateral 07/16/2018   Procedure: ABDOMINAL AORTOGRAM W/LOWER EXTREMITY;  Surgeon: Wellington Hampshire, MD;  Location: [facility];  Service: Cardiovascular;  Laterality: Bilateral;  . AMPUTATION Left 08/19/2018   Procedure: AMPUTATION LEFT TOES SECOND AND THIRD;  Surgeon: Waynetta Sandy, MD;  Location: [facility];  Service: Vascular;  Laterality: Left;  . AV FISTULA PLACEMENT Right 12/27/2015   Procedure: Right Arm ARTERIOVENOUS (AV) FISTULA CREATION;  Surgeon: Angelia Mould, MD;  Location: [facility];  Service: Vascular;  Laterality: Right;  . COLONOSCOPY    . FEMORAL-POPLITEAL BYPASS GRAFT Left 07/22/2018   Procedure: left FEMORAL TO BELOW KNEE POPLITEAL ARTERY BYPASS GREAFT using 57mm removable ring propaten gore graft;  Surgeon: Waynetta Sandy, MD;  Location: [facility];  Service: Vascular;  Laterality: Left;  . IR GENERIC HISTORICAL  01/27/2016   IR FLUORO GUIDE CV LINE RIGHT 01/27/2016 Arne Cleveland, MD [facility]  . IR GENERIC HISTORICAL  01/27/2016   IR US GUIDE VASC ACCESS RIGHT 01/27/2016 Arne Cleveland, MD [facility]  . LAPAROSCOPIC CHOLECYSTECTOMY  03/2011  . LEFT  HEART CATH AND CORONARY ANGIOGRAPHY N/A 11/25/2017   Procedure: LEFT HEART CATH AND CORONARY ANGIOGRAPHY;  Surgeon: Martinique, Peter M, MD;  Location: [facility];  Service: Cardiovascular;  Laterality: N/A;  . LEFT HEART CATH AND CORONARY ANGIOGRAPHY N/A 07/24/2018   Procedure: LEFT HEART CATH AND CORONARY ANGIOGRAPHY;  Surgeon: Sherren Mocha, MD;  Location: [facility];  Service: Cardiovascular;  Laterality: N/A;  . RENAL BIOPSY, PERCUTANEOUS Right 09/08/2014  . RIGHT/LEFT HEART CATH AND CORONARY ANGIOGRAPHY N/A 03/25/2018   Procedure: RIGHT/LEFT HEART CATH AND CORONARY ANGIOGRAPHY;  Surgeon: Martinique, Peter M, MD;  Location: [facility];  Service: Cardiovascular;  Laterality: N/A;  . TOE AMPUTATION Left 08/19/2018   2nd & 3rd toes  . TONSILLECTOMY  ~ 1960  . ULTRASOUND GUIDANCE FOR VASCULAR ACCESS  03/25/2018   Procedure: Ultrasound Guidance For Vascular Access;  Surgeon: Martinique, Peter M, MD;  Location: [facility];  Service: Cardiovascular;;    Family History  Problem Relation Age of Onset  . Hypertension Mother   . Diabetes Mother   . Heart disease Mother   .  Skin cancer Mother   . Hypertension Father   . Diabetes Father   . Diabetes Sister     Social History   Socioeconomic History  . Marital status: Divorced    Spouse name: Not on file  . Number of children: Not on file  . Years of education: Not on file  . Highest education level: Not on file  Occupational History  . Not on file  Social Needs  . Financial resource strain: Not on file  . Food insecurity    Worry: Not on file    Inability: Not on file  . Transportation needs    Medical: Not on file    Non-medical: Not on file  Tobacco Use  . Smoking status: Former Smoker    Packs/day: 2.00    Years: 40.00    Pack years: 80.00    Types: Cigarettes    Quit date: 03/19/2011    Years since quitting: 7.4  . Smokeless tobacco: Never Used  Substance and Sexual Activity  . Alcohol use: No     Alcohol/week: 0.0 standard drinks  . Drug use: No    Comment: 09/08/2014 "I have used marijuana till the 1990's". Notes that he quit 15 yrs ago  . Sexual activity: Not Currently  Lifestyle  . Physical activity    Days per week: Not on file    Minutes per session: Not on file  . Stress: Not on file  Relationships  . Social Herbalist on phone: Not on file    Gets together: Not on file    Attends religious service: Not on file    Active member of club or organization: Not on file    Attends meetings of clubs or organizations: Not on file    Relationship status: Not on file  . Intimate partner violence    Fear of current or ex partner: Not on file    Emotionally abused: Not on file    Physically abused: Not on file    Forced sexual activity: Not on file  Other Topics Concern  . 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   Patient's granddaughter, [name], is currently living with the patient and providing care.  ROS Review of Systems  Constitutional: Positive for chills (Brief episode x1 yesterday) and fatigue. Negative for fever.  HENT: Negative for nosebleeds, sore throat and trouble swallowing.   Respiratory: Negative for cough and shortness of breath.   Cardiovascular: Positive for leg swelling. Negative for chest pain and palpitations.  Gastrointestinal: Positive for constipation. Negative for abdominal pain, blood in stool, diarrhea and nausea.  Endocrine: Negative for polydipsia, polyphagia and polyuria.  Genitourinary: Negative for dysuria and frequency.  Musculoskeletal: Positive for arthralgias and gait problem.  Skin: Positive for wound. Negative for rash.  Neurological: Positive for dizziness and light-headedness. Negative for headaches.  Hematological: Negative for adenopathy. Bruises/bleeds easily.    Psychiatric/Behavioral: Negative for self-injury and suicidal ideas. The patient is nervous/anxious (Mild).     Objective:   Today's Vitals: BP (!) 94/57 (BP Location: Right Arm, Patient Position: Sitting, Cuff Size: Large)   Pulse 98   Temp 98.4 F (36.9 C) (Oral)   Ht 5\' 10"  (1.778 m)   Wt 184 lb (83.5 kg)   SpO2 94%   BMI 26.40 kg/m   Physical  Exam Vitals signs and nursing note reviewed.  Constitutional:      General: He is not in acute distress.    Appearance: Normal appearance. He is ill-appearing.  Neck:     Musculoskeletal: Normal range of motion and neck supple.     Vascular: Carotid bruit (right) present.  Cardiovascular:     Rate and Rhythm: Normal rate and regular rhythm.     Heart sounds: Murmur present.  Pulmonary:     Effort: Pulmonary effort is normal.     Breath sounds: Normal breath sounds.  Abdominal:     Palpations: Abdomen is soft.     Tenderness: There is no abdominal tenderness. There is no right CVA tenderness, left CVA tenderness, guarding or rebound.  Musculoskeletal:        General: Deformity present.     Right lower leg: Edema present.     Left lower leg: Edema present.     Comments: Patient status post amputation of second and third toes of the left foot and patient with edema, erythema of the skin with increased warmth of the left foot.  No active drainage when Xeroform briefly removed but patient with serosanguineous drainage onto the bandaging.  Palpable pulse left foot and posterior tibial  Lymphadenopathy:     Cervical: No cervical adenopathy.  Skin:    Findings: Erythema and lesion (raised bumpy, hyperpigmented skin lesion on the left temple) present.     Comments: Patient with edema/erythema and increased warmth of the left foot along with some serosanguineous drainage of the bandaging at site of amputation; toes and foot of right foot with some erythema of foot and increased warmth of toes and pulse palpable  Neurological:     General:  No focal deficit present.     Mental Status: He is alert and oriented to person, place, and time.  Psychiatric:        Mood and Affect: Mood normal.        Behavior: Behavior normal.     Assessment & Plan:  1. Left foot infection Patient with left foot infection/cellulitis status post recent amputation of the second and third toes.  He will be placed on Augmentin 500 mg half pill twice daily with renal adjustment and patient has home wound nurse evaluation in the next 2 hours.  Discussed with granddaughter that she may need to contact vascular doctor for sooner follow-up if increased redness, increased pain or patient with fever/chills or any other concerns. - amoxicillin-clavulanate (AUGMENTIN) 500-125 MG tablet; 1/2 pill twice per day for 10 days; take after eating  Dispense: 10 tablet; Refill: 0  2. Skin lesion of face Patient with what appears to be a pre- or cancerous lesion on left temple and also with other skin lesions on the head and face which could represent cancerous or precancerous lesions - Ambulatory referral to Dermatology  3. Peripheral arterial disease (HCC); 4.  Right carotid bruit; 5.  NSTEMI myocardial infarction Patient with known peripheral arterial disease status post femoropopliteal bypass and recent amputation of the second and third toes due to nonviable tissue.  Continue vascular follow-up as well as medical interventions including Plavix and aspirin.  Patient with right carotid bruit on exam I suspect that he also has carotid atherosclerosis with possible stenosis.  Granddaughter will mention this to patient's vascular surgeon.  Patient status post NSTEMI and cardiac cath.  Patient did have occlusions of the coronary arteries that were not amenable to intervention and patient with medical therapies including atorvastatin,  aspirin and Plavix.  6. Chronic combined systolic and diastolic heart failure ([place]); 9.  End-stage renal disease on dialysis Patient does have some  increased lower extremity edema today's visit but fluid status will be adjusted when he has dialysis tomorrow for his end-stage renal disease.  CHF overall appears to be stable at this time.  Patient had electrolytes checked at yesterday's visit and these will also be rechecked at dialysis  7. Nonrheumatic aortic valve stenosis Stable, continue cardiology follow-up  8. Amputated toe, left (HCC) Patient status post amputation of the left second and third toes due to nonviable tissue because of peripheral arterial disease  9.  Debility Patient with debility due to his multiple chronic medical conditions including recent hospitalizations x2 with most recent hospitalization due to the need for removal of the second and third toes of the left foot due to nonviable tissue/peripheral arterial disease.  Patient does have some home health follow-up pending  10.  Anemia of chronic disease Patient status post transfusion in May and had hematology follow-up yesterday with blood work done at that time therefore labs will not be repeated at today's visit  Outpatient Encounter Medications as of 08/21/2018  Medication Sig  . acetaminophen (TYLENOL) 500 MG tablet Take 500-1,000 mg by mouth daily as needed (pain).   [name] aspirin EC 81 MG tablet Take 1 tablet (81 mg total) by mouth daily.  [name] atorvastatin (LIPITOR) 80 MG tablet Take 1 tablet (80 mg total) by mouth daily with supper.  . cephALEXin (KEFLEX) 250 MG capsule Take 250 mg by mouth 2 (two) times daily. Started 08/12/2018-  Med to be finished 08/22/2018  . cetirizine (ZYRTEC) 10 MG tablet Take 10 mg by mouth daily.  . clopidogrel (PLAVIX) 75 MG tablet Take 1 tablet (75 mg total) by mouth daily with supper.  . Darbepoetin Alfa (ARANESP) 200 MCG/0.4ML SOSY injection Inject 0.4 mLs (200 mcg total) into the vein every Wednesday with hemodialysis.  [name] docusate sodium (COLACE) 100 MG capsule Take 1 capsule (100 mg total) by mouth 3 (three) times daily.  [name] gabapentin  (NEURONTIN) 300 MG capsule Take 1 capsule (300 mg total) by mouth every morning. (Patient taking differently: Take 300-600 mg by mouth See admin instructions. 300 mg in the morning, and 600 mg twice daily, lunch and dinner)  . LORazepam (ATIVAN) 1 MG tablet Take 1 mg by mouth 2 (two) times daily as needed for anxiety.  . midodrine (PROAMATINE) 10 MG tablet Take 1 tablet (10 mg total) by mouth every Monday, Wednesday, and Friday with hemodialysis.  [name] multivitamin (RENA-VIT) TABS tablet Take 1 tablet by mouth at bedtime.  . mupirocin ointment (BACTROBAN) 2 % Apply 1 application topically daily.  [name] oxyCODONE (OXY IR/ROXICODONE) 5 MG immediate release tablet Take 1-2 tablets (5-10 mg total) by mouth every 6 (six) hours as needed for severe pain. (Patient taking differently: Take 5 mg by mouth every 6 (six) hours as needed for severe pain. )  . oxyCODONE (OXY IR/ROXICODONE) 5 MG immediate release tablet Take 1-2 tablets (5-10 mg total) by mouth every 4 (four) hours as needed for moderate pain.  . pantoprazole (PROTONIX) 40 MG tablet Take 1 tablet (40 mg total) by mouth daily.  . sucroferric oxyhydroxide (VELPHORO) 500 MG chewable tablet Chew 2 tablets (1,000 mg total) by mouth 3 (three) times daily with meals.  . vitamin B-12 (CYANOCOBALAMIN) 1000 MCG tablet Take 1,000 mcg by mouth daily.   No facility-administered encounter medications on file as of 08/21/2018.  Follow-up: Return in about 2 weeks (around 09/04/2018) for foot infection.  [name], MD

## 2018-08-21 NOTE — Patient Instructions (Signed)

## 2018-08-22 ENCOUNTER — Telehealth: Payer: Self-pay | Admitting: Vascular Surgery

## 2018-08-22 DIAGNOSIS — D509 Iron deficiency anemia, unspecified: Secondary | ICD-10-CM | POA: Diagnosis not present

## 2018-08-22 DIAGNOSIS — Z95828 Presence of other vascular implants and grafts: Secondary | ICD-10-CM | POA: Diagnosis not present

## 2018-08-22 DIAGNOSIS — I132 Hypertensive heart and chronic kidney disease with heart failure and with stage 5 chronic kidney disease, or end stage renal disease: Secondary | ICD-10-CM | POA: Diagnosis not present

## 2018-08-22 DIAGNOSIS — N186 End stage renal disease: Secondary | ICD-10-CM | POA: Diagnosis not present

## 2018-08-22 DIAGNOSIS — I2510 Atherosclerotic heart disease of native coronary artery without angina pectoris: Secondary | ICD-10-CM | POA: Diagnosis not present

## 2018-08-22 DIAGNOSIS — N2581 Secondary hyperparathyroidism of renal origin: Secondary | ICD-10-CM | POA: Diagnosis not present

## 2018-08-22 DIAGNOSIS — D631 Anemia in chronic kidney disease: Secondary | ICD-10-CM | POA: Diagnosis not present

## 2018-08-22 DIAGNOSIS — I739 Peripheral vascular disease, unspecified: Secondary | ICD-10-CM | POA: Diagnosis not present

## 2018-08-22 DIAGNOSIS — Z48812 Encounter for surgical aftercare following surgery on the circulatory system: Secondary | ICD-10-CM | POA: Diagnosis not present

## 2018-08-22 DIAGNOSIS — I214 Non-ST elevation (NSTEMI) myocardial infarction: Secondary | ICD-10-CM | POA: Diagnosis not present

## 2018-08-22 NOTE — Telephone Encounter (Addendum)
Beth from [facility] called.  Patient has a Seroma in L groin, and a lump right below the bypass graft site, LLE.  He is very red and swollen near his foot.  Edema is 3+.  Warm to the touch.  Another MD put him on Amoxicillin yesterday.   Per Dr. Donzetta Matters, Pt just had a procedure.  Give it more time, at least another week.  Pt has appointment on 09/05/18 to see the PA.  If it is no better, come in for an earlier appointment.  I called Beth and left voice message.  I called and spoke to [place], patient's daughter.  She verbalized understanding.   Thurston Hole., LPN

## 2018-08-25 DIAGNOSIS — D509 Iron deficiency anemia, unspecified: Secondary | ICD-10-CM | POA: Diagnosis not present

## 2018-08-25 DIAGNOSIS — D631 Anemia in chronic kidney disease: Secondary | ICD-10-CM | POA: Diagnosis not present

## 2018-08-25 DIAGNOSIS — N186 End stage renal disease: Secondary | ICD-10-CM | POA: Diagnosis not present

## 2018-08-25 DIAGNOSIS — N2581 Secondary hyperparathyroidism of renal origin: Secondary | ICD-10-CM | POA: Diagnosis not present

## 2018-08-25 NOTE — Discharge Summary (Addendum)
[facility] Discharge Summary   Patient ID:  Jose Garrett MRN: [id number] DOB/AGE: 1950/11/24 68 y.o.  Admit date: 08/19/2018 Discharge date: 08/20/2018 Date of Surgery: 08/19/2018 Surgeon: Surgeon(s): Waynetta Sandy, MD  Admission Diagnosis: nonviable tissue  Discharge Diagnoses:  nonviable tissue  Secondary Diagnoses: Past Medical History:  Diagnosis Date  . Amyloidosis ([place])   . Anemia Dx 2016  . Anxiety    "especially with procedure"- 07/21/2018  . Aortic stenosis   . CAD (coronary artery disease)    a. cardiac cath on 11/25/17 with occlusion of the RCA which could have been his event but the RCA could not be engaged. The RCA filled distally from left to right collaterals. There were no severe stenoses in the left coronary system. Medical therapy recommended, started on plavix.  . Cancer ([place])   . Chronic combined systolic and diastolic CHF (congestive heart failure) ([place])    a. EF dropped to 35-40% with grade 2 DD by echo 11/2017.  [name] Diarrhea   . ESRD (end stage renal disease) ([place])    ESRD- HEMO MWF- [facility]  . Family history of adverse reaction to anesthesia    "my daughter can't take certain anesthesia agents" (09/08/2014)  . GERD (gastroesophageal reflux disease)   . Heart murmur   . History of blood transfusion 09/08/2014   "got hematoma after renal biopsy & HgB dropped"  . Hyperlipidemia Dx 2012  . Hypertension Dx 2012  . Myocardial infarction ([place]) 11/2017  . Neuropathy   . Peripheral vascular disease ([place])   . Pneumonia   . Restless legs     Procedure(s): AMPUTATION LEFT TOES SECOND AND THIRD  Discharged Condition: good  HPI: 68 year old male follows up after left femoral to popliteal artery bypass for gangrenous changes to his left toes.  Also has discoloration of his right toes.  Postoperatively underwent cardiac catheterization with no intervention.  Has now completed rehab.  Is on aspirin, Plavix, statin.  Plan for  amputation of left 2nd and 3rd toes. Ankle block to be placed by anesthesia and will admit for overnight observation.  Hospital Course:  Jose Garrett is a 68 y.o. male is S/P  Procedure(s): AMPUTATION LEFT TOES SECOND AND THIRD Dressing changed incision healing well. Clean dry dressing applied and patient was discharge home in stable condition.     Significant Diagnostic Studies: CBC Lab Results  Component Value Date   WBC 5.8 08/20/2018   HGB 9.5 (L) 08/20/2018   HCT 29.9 (L) 08/20/2018   MCV 101.0 (H) 08/20/2018   PLT 79 (L) 08/20/2018    BMET    Component Value Date/Time   NA 136 08/20/2018 0113   NA 135 07/14/2018 0909   NA 141 03/14/2017 0921   K 4.5 08/20/2018 0113   K 4.1 03/14/2017 0921   CL 94 (L) 08/20/2018 0113   CO2 27 08/20/2018 0113   CO2 33 (H) 03/14/2017 0921   GLUCOSE 109 (H) 08/20/2018 0113   GLUCOSE 144 (H) 03/14/2017 0921   BUN 36 (H) 08/20/2018 0113   BUN 76 (HH) 07/14/2018 0909   BUN 30.1 (H) 03/14/2017 0921   CREATININE 8.65 (H) 08/20/2018 0113   CREATININE 9.16 (HH) 09/17/2017 0954   CREATININE 8.1 (HH) 03/14/2017 0921   CALCIUM 8.3 (L) 08/20/2018 0113   CALCIUM 9.5 03/14/2017 0921   GFRNONAA 6 (L) 08/20/2018 0113   GFRNONAA 5 (L) 09/17/2017 0954   GFRNONAA 24 (L) 06/30/2014 1528   GFRAA 7 (L) 08/20/2018 0113  GFRAA 6 (L) 09/17/2017 0954   GFRAA 28 (L) 06/30/2014 1528   COAG Lab Results  Component Value Date   INR 1.2 07/22/2018   INR 1.27 01/23/2016   INR 1.10 (L) 01/21/2015   PROTIME 13.2 01/21/2015     Disposition:  Discharge to :Home Discharge Instructions    Call MD for:  redness, tenderness, or signs of infection (pain, swelling, bleeding, redness, odor or green/yellow discharge around incision site)   Complete by: As directed    Call MD for:  severe or increased pain, loss or decreased feeling  in affected limb(s)   Complete by: As directed    Call MD for:  temperature >100.5   Complete by: As directed    Discharge  instructions   Complete by: As directed    Dry dressing daily, when no drainage you can wear a dry sock.  Heel weight bearing in hard sole shoe.  You may shower daily.   Resume previous diet   Complete by: As directed      Allergies as of 08/20/2018   No Known Allergies     Medication List    STOP taking these medications   calcitRIOL 0.25 MCG capsule Commonly known as: ROCALTROL   calcium carbonate 500 MG chewable tablet Commonly known as: TUMS - dosed in mg elemental calcium   cinacalcet 30 MG tablet Commonly known as: SENSIPAR     TAKE these medications   acetaminophen 500 MG tablet Commonly known as: TYLENOL Take 500-1,000 mg by mouth daily as needed (pain).   aspirin EC 81 MG tablet Take 1 tablet (81 mg total) by mouth daily.   atorvastatin 80 MG tablet Commonly known as: LIPITOR Take 1 tablet (80 mg total) by mouth daily with supper.   cephALEXin 250 MG capsule Commonly known as: KEFLEX Take 250 mg by mouth 2 (two) times daily. Started 08/12/2018-  Med to be finished 08/22/2018   cetirizine 10 MG tablet Commonly known as: ZYRTEC Take 10 mg by mouth daily.   clopidogrel 75 MG tablet Commonly known as: PLAVIX Take 1 tablet (75 mg total) by mouth daily with supper.   Darbepoetin Alfa 200 MCG/0.4ML Sosy injection Commonly known as: ARANESP Inject 0.4 mLs (200 mcg total) into the vein every Wednesday with hemodialysis.   docusate sodium 100 MG capsule Commonly known as: COLACE Take 1 capsule (100 mg total) by mouth 3 (three) times daily.   gabapentin 300 MG capsule Commonly known as: NEURONTIN Take 1 capsule (300 mg total) by mouth every morning. What changed:   how much to take  when to take this  additional instructions   LORazepam 1 MG tablet Commonly known as: ATIVAN Take 1 mg by mouth 2 (two) times daily as needed for anxiety.   midodrine 10 MG tablet Commonly known as: PROAMATINE Take 1 tablet (10 mg total) by mouth every Monday, Wednesday,  and Friday with hemodialysis.   multivitamin Tabs tablet Take 1 tablet by mouth at bedtime.   mupirocin ointment 2 % Commonly known as: BACTROBAN Apply 1 application topically daily.   oxyCODONE 5 MG immediate release tablet Commonly known as: Oxy IR/ROXICODONE Take 1-2 tablets (5-10 mg total) by mouth every 6 (six) hours as needed for severe pain. What changed: how much to take   oxyCODONE 5 MG immediate release tablet Commonly known as: Oxy IR/ROXICODONE Take 1-2 tablets (5-10 mg total) by mouth every 4 (four) hours as needed for moderate pain. What changed: You were already taking a medication  with the same name, and this prescription was added. Make sure you understand how and when to take each.   pantoprazole 40 MG tablet Commonly known as: PROTONIX Take 1 tablet (40 mg total) by mouth daily.   sucroferric oxyhydroxide 500 MG chewable tablet Commonly known as: VELPHORO Chew 2 tablets (1,000 mg total) by mouth 3 (three) times daily with meals.   vitamin B-12 1000 MCG tablet Commonly known as: CYANOCOBALAMIN Take 1,000 mcg by mouth daily.      Verbal and written Discharge instructions given to the patient. Wound care per Discharge AVS Follow-up Information    Waynetta Sandy, MD Follow up in 2 week(s).   Specialties: Vascular Surgery, Cardiology Why: office will call Contact information: [place] [address] [phone]        [facility]. Go on 08/21/2018.   Why: 9:30AM Please bring all medications you taking and copy of discharge instructions. Contact information: [address] [place] [address] [phone]          Signed: Roxy Horseman 08/25/2018, 11:13 AM

## 2018-08-26 ENCOUNTER — Telehealth: Payer: Self-pay

## 2018-08-26 DIAGNOSIS — I132 Hypertensive heart and chronic kidney disease with heart failure and with stage 5 chronic kidney disease, or end stage renal disease: Secondary | ICD-10-CM | POA: Diagnosis not present

## 2018-08-26 DIAGNOSIS — I739 Peripheral vascular disease, unspecified: Secondary | ICD-10-CM | POA: Diagnosis not present

## 2018-08-26 DIAGNOSIS — I2510 Atherosclerotic heart disease of native coronary artery without angina pectoris: Secondary | ICD-10-CM | POA: Diagnosis not present

## 2018-08-26 DIAGNOSIS — Z95828 Presence of other vascular implants and grafts: Secondary | ICD-10-CM | POA: Diagnosis not present

## 2018-08-26 DIAGNOSIS — Z48812 Encounter for surgical aftercare following surgery on the circulatory system: Secondary | ICD-10-CM | POA: Diagnosis not present

## 2018-08-26 DIAGNOSIS — I214 Non-ST elevation (NSTEMI) myocardial infarction: Secondary | ICD-10-CM | POA: Diagnosis not present

## 2018-08-26 NOTE — Telephone Encounter (Signed)
Beth with [facility] called to get orders for wound care for patient.   Spoke with [employer] as it seems she has been dealing with Corporate treasurer and Mr Trenton Gammon. Left message advising Beth that per Dr Donzetta Matters to use Xeroform, 4x4 gauze and Kerlix dressing.   York Cerise, CMA

## 2018-08-27 DIAGNOSIS — I2510 Atherosclerotic heart disease of native coronary artery without angina pectoris: Secondary | ICD-10-CM | POA: Diagnosis not present

## 2018-08-27 DIAGNOSIS — I214 Non-ST elevation (NSTEMI) myocardial infarction: Secondary | ICD-10-CM | POA: Diagnosis not present

## 2018-08-27 DIAGNOSIS — D631 Anemia in chronic kidney disease: Secondary | ICD-10-CM | POA: Diagnosis not present

## 2018-08-27 DIAGNOSIS — N186 End stage renal disease: Secondary | ICD-10-CM | POA: Diagnosis not present

## 2018-08-27 DIAGNOSIS — Z48812 Encounter for surgical aftercare following surgery on the circulatory system: Secondary | ICD-10-CM | POA: Diagnosis not present

## 2018-08-27 DIAGNOSIS — N2581 Secondary hyperparathyroidism of renal origin: Secondary | ICD-10-CM | POA: Diagnosis not present

## 2018-08-27 DIAGNOSIS — D509 Iron deficiency anemia, unspecified: Secondary | ICD-10-CM | POA: Diagnosis not present

## 2018-08-27 DIAGNOSIS — I739 Peripheral vascular disease, unspecified: Secondary | ICD-10-CM

## 2018-08-27 DIAGNOSIS — I132 Hypertensive heart and chronic kidney disease with heart failure and with stage 5 chronic kidney disease, or end stage renal disease: Secondary | ICD-10-CM

## 2018-08-27 DIAGNOSIS — Z95828 Presence of other vascular implants and grafts: Secondary | ICD-10-CM | POA: Diagnosis not present

## 2018-08-27 DIAGNOSIS — I5042 Chronic combined systolic (congestive) and diastolic (congestive) heart failure: Secondary | ICD-10-CM | POA: Diagnosis not present

## 2018-08-27 NOTE — Progress Notes (Addendum)
POST OPERATIVE OFFICE NOTE    CC:  F/u for surgery  HPI:  This is a 68 y.o. male who is s/p left femoral to below knee popliteal bypass graft with 28mm ringed PTFE on 07/22/2018 and amputation of left 2nd and 3rd toes on 08/19/2018 also by Dr. Donzetta Matters.  He returns to clinic because the home health nurse called and stated patient had a firm hematoma at his left groin incision.  Patient states left groin fluid collection is not painful or bothersome.  He was seen by podiatry who placed him on Augmentin last week due to redness around his toe amputation.  He denies any fevers, chills, nausea/vomiting.  He also denies any rest pain in his left foot.  No Known Allergies  Current Outpatient Medications  Medication Sig Dispense Refill  . acetaminophen (TYLENOL) 500 MG tablet Take 500-1,000 mg by mouth daily as needed (pain).     [name] amoxicillin-clavulanate (AUGMENTIN) 500-125 MG tablet 1/2 pill twice per day for 10 days; take after eating 10 tablet 0  . aspirin EC 81 MG tablet Take 1 tablet (81 mg total) by mouth daily. 30 tablet 11  . atorvastatin (LIPITOR) 80 MG tablet Take 1 tablet (80 mg total) by mouth daily with supper. 30 tablet 0  . cephALEXin (KEFLEX) 250 MG capsule Take 250 mg by mouth 2 (two) times daily. Started 08/12/2018-  Med to be finished 08/22/2018    . cetirizine (ZYRTEC) 10 MG tablet Take 10 mg by mouth daily.    . clopidogrel (PLAVIX) 75 MG tablet Take 1 tablet (75 mg total) by mouth daily with supper. 30 tablet 0  . Darbepoetin Alfa (ARANESP) 200 MCG/0.4ML SOSY injection Inject 0.4 mLs (200 mcg total) into the vein every Wednesday with hemodialysis. 1.68 mL   . docusate sodium (COLACE) 100 MG capsule Take 1 capsule (100 mg total) by mouth 3 (three) times daily. 10 capsule 0  . gabapentin (NEURONTIN) 300 MG capsule Take 1 capsule (300 mg total) by mouth every morning. (Patient taking differently: Take 300-600 mg by mouth See admin instructions. 300 mg in the morning, and 600 mg twice daily,  lunch and dinner) 30 capsule 0  . LORazepam (ATIVAN) 1 MG tablet Take 1 mg by mouth 2 (two) times daily as needed for anxiety.    . midodrine (PROAMATINE) 10 MG tablet Take 1 tablet (10 mg total) by mouth every Monday, Wednesday, and Friday with hemodialysis. 30 tablet 0  . multivitamin (RENA-VIT) TABS tablet Take 1 tablet by mouth at bedtime. 30 tablet 0  . mupirocin ointment (BACTROBAN) 2 % Apply 1 application topically daily.    [name] oxyCODONE (OXY IR/ROXICODONE) 5 MG immediate release tablet Take 1-2 tablets (5-10 mg total) by mouth every 6 (six) hours as needed for severe pain. (Patient taking differently: Take 5 mg by mouth every 6 (six) hours as needed for severe pain. ) 20 tablet 0  . oxyCODONE (OXY IR/ROXICODONE) 5 MG immediate release tablet Take 1-2 tablets (5-10 mg total) by mouth every 4 (four) hours as needed for moderate pain. 30 tablet 0  . pantoprazole (PROTONIX) 40 MG tablet Take 1 tablet (40 mg total) by mouth daily. 30 tablet 0  . sucroferric oxyhydroxide (VELPHORO) 500 MG chewable tablet Chew 2 tablets (1,000 mg total) by mouth 3 (three) times daily with meals. 90 tablet 0  . vitamin B-12 (CYANOCOBALAMIN) 1000 MCG tablet Take 1,000 mcg by mouth daily.     No current facility-administered medications for this visit.  ROS:  See HPI  Physical Exam:  Left popliteal incision well-healed with small seroma just distal to incision; left toe amp site healing well with no sign of drainage or other infection; foot is warm to touch and well perfused; left groin incision with a sizable seroma however incision well-healed; no redness or other signs of infection   Assessment/Plan:  This is a 68 y.o. male who is s/p: eft femoral to below knee popliteal bypass graft with 68mm ringed PTFE on 07/22/2018 and amputation of left 2nd and 3rd toes on 08/19/2018 also by Dr. Donzetta Matters.  -Patent bypass with well-perfused left foot on exam -Left groin incision well-healed however there is a sizable seroma  -Patient is also only a week out from toe amputations so sutures will remain in place for another 1 to 2 weeks -No sign of infection in left groin; unless seroma begins to drain we will err on the side of conservative management of groin seroma given that bypasses with PTFE -Patient is aware of risk of infection with PTFE bypass - He will return to office in 1-2 weeks for suture removal and wound check - toe discoloration of R foot stable;  Patient will need RLE bypass when recovered from recent surgery  Dagoberto Ligas, PA-C [facility] [phone]  Clinic MD:  Donzetta Matters

## 2018-08-28 ENCOUNTER — Telehealth (HOSPITAL_COMMUNITY): Payer: Self-pay | Admitting: Rehabilitation

## 2018-08-28 ENCOUNTER — Telehealth: Payer: Self-pay | Admitting: Vascular Surgery

## 2018-08-28 DIAGNOSIS — I132 Hypertensive heart and chronic kidney disease with heart failure and with stage 5 chronic kidney disease, or end stage renal disease: Secondary | ICD-10-CM | POA: Diagnosis not present

## 2018-08-28 DIAGNOSIS — I739 Peripheral vascular disease, unspecified: Secondary | ICD-10-CM | POA: Diagnosis not present

## 2018-08-28 DIAGNOSIS — Z48812 Encounter for surgical aftercare following surgery on the circulatory system: Secondary | ICD-10-CM | POA: Diagnosis not present

## 2018-08-28 DIAGNOSIS — I214 Non-ST elevation (NSTEMI) myocardial infarction: Secondary | ICD-10-CM | POA: Diagnosis not present

## 2018-08-28 DIAGNOSIS — Z95828 Presence of other vascular implants and grafts: Secondary | ICD-10-CM | POA: Diagnosis not present

## 2018-08-28 DIAGNOSIS — I2510 Atherosclerotic heart disease of native coronary artery without angina pectoris: Secondary | ICD-10-CM | POA: Diagnosis not present

## 2018-08-28 NOTE — Telephone Encounter (Signed)

## 2018-08-28 NOTE — Telephone Encounter (Addendum)
Beth, RN with [facility] called today and left a voice message.  Patient is having swelling in the left lower leg.  He is also very swollen at the incision site of the left groin.  There is a hematoma that is hard to the touch.    Pt is scheduled to see [place] in the office tomorrow.    Thurston Hole., LPN

## 2018-08-29 ENCOUNTER — Ambulatory Visit (INDEPENDENT_AMBULATORY_CARE_PROVIDER_SITE_OTHER): Payer: Self-pay | Admitting: Physician Assistant

## 2018-08-29 ENCOUNTER — Other Ambulatory Visit: Payer: Self-pay

## 2018-08-29 VITALS — BP 104/59 | HR 92 | Temp 99.3°F | Resp 18 | Ht 70.0 in | Wt 184.0 lb

## 2018-08-29 DIAGNOSIS — N2581 Secondary hyperparathyroidism of renal origin: Secondary | ICD-10-CM | POA: Diagnosis not present

## 2018-08-29 DIAGNOSIS — D509 Iron deficiency anemia, unspecified: Secondary | ICD-10-CM | POA: Diagnosis not present

## 2018-08-29 DIAGNOSIS — D631 Anemia in chronic kidney disease: Secondary | ICD-10-CM | POA: Diagnosis not present

## 2018-08-29 DIAGNOSIS — N186 End stage renal disease: Secondary | ICD-10-CM | POA: Diagnosis not present

## 2018-08-29 DIAGNOSIS — I739 Peripheral vascular disease, unspecified: Secondary | ICD-10-CM

## 2018-08-30 DIAGNOSIS — Z95828 Presence of other vascular implants and grafts: Secondary | ICD-10-CM | POA: Diagnosis not present

## 2018-08-30 DIAGNOSIS — I132 Hypertensive heart and chronic kidney disease with heart failure and with stage 5 chronic kidney disease, or end stage renal disease: Secondary | ICD-10-CM | POA: Diagnosis not present

## 2018-08-30 DIAGNOSIS — Z48812 Encounter for surgical aftercare following surgery on the circulatory system: Secondary | ICD-10-CM | POA: Diagnosis not present

## 2018-08-30 DIAGNOSIS — I214 Non-ST elevation (NSTEMI) myocardial infarction: Secondary | ICD-10-CM | POA: Diagnosis not present

## 2018-08-30 DIAGNOSIS — I739 Peripheral vascular disease, unspecified: Secondary | ICD-10-CM | POA: Diagnosis not present

## 2018-08-30 DIAGNOSIS — I2510 Atherosclerotic heart disease of native coronary artery without angina pectoris: Secondary | ICD-10-CM | POA: Diagnosis not present

## 2018-09-01 DIAGNOSIS — D631 Anemia in chronic kidney disease: Secondary | ICD-10-CM | POA: Diagnosis not present

## 2018-09-01 DIAGNOSIS — I739 Peripheral vascular disease, unspecified: Secondary | ICD-10-CM | POA: Diagnosis not present

## 2018-09-01 DIAGNOSIS — I214 Non-ST elevation (NSTEMI) myocardial infarction: Secondary | ICD-10-CM | POA: Diagnosis not present

## 2018-09-01 DIAGNOSIS — N2581 Secondary hyperparathyroidism of renal origin: Secondary | ICD-10-CM | POA: Diagnosis not present

## 2018-09-01 DIAGNOSIS — Z48812 Encounter for surgical aftercare following surgery on the circulatory system: Secondary | ICD-10-CM | POA: Diagnosis not present

## 2018-09-01 DIAGNOSIS — I132 Hypertensive heart and chronic kidney disease with heart failure and with stage 5 chronic kidney disease, or end stage renal disease: Secondary | ICD-10-CM | POA: Diagnosis not present

## 2018-09-01 DIAGNOSIS — N186 End stage renal disease: Secondary | ICD-10-CM | POA: Diagnosis not present

## 2018-09-01 DIAGNOSIS — I2510 Atherosclerotic heart disease of native coronary artery without angina pectoris: Secondary | ICD-10-CM | POA: Diagnosis not present

## 2018-09-01 DIAGNOSIS — D509 Iron deficiency anemia, unspecified: Secondary | ICD-10-CM | POA: Diagnosis not present

## 2018-09-01 DIAGNOSIS — Z95828 Presence of other vascular implants and grafts: Secondary | ICD-10-CM | POA: Diagnosis not present

## 2018-09-02 DIAGNOSIS — I2510 Atherosclerotic heart disease of native coronary artery without angina pectoris: Secondary | ICD-10-CM | POA: Diagnosis not present

## 2018-09-02 DIAGNOSIS — I214 Non-ST elevation (NSTEMI) myocardial infarction: Secondary | ICD-10-CM | POA: Diagnosis not present

## 2018-09-02 DIAGNOSIS — Z48812 Encounter for surgical aftercare following surgery on the circulatory system: Secondary | ICD-10-CM | POA: Diagnosis not present

## 2018-09-02 DIAGNOSIS — I739 Peripheral vascular disease, unspecified: Secondary | ICD-10-CM | POA: Diagnosis not present

## 2018-09-02 DIAGNOSIS — I132 Hypertensive heart and chronic kidney disease with heart failure and with stage 5 chronic kidney disease, or end stage renal disease: Secondary | ICD-10-CM | POA: Diagnosis not present

## 2018-09-02 DIAGNOSIS — Z95828 Presence of other vascular implants and grafts: Secondary | ICD-10-CM | POA: Diagnosis not present

## 2018-09-03 ENCOUNTER — Inpatient Hospital Stay: Payer: Medicare Other | Attending: Hematology | Admitting: Hematology

## 2018-09-03 DIAGNOSIS — R011 Cardiac murmur, unspecified: Secondary | ICD-10-CM | POA: Insufficient documentation

## 2018-09-03 DIAGNOSIS — D509 Iron deficiency anemia, unspecified: Secondary | ICD-10-CM | POA: Diagnosis not present

## 2018-09-03 DIAGNOSIS — Z992 Dependence on renal dialysis: Secondary | ICD-10-CM | POA: Insufficient documentation

## 2018-09-03 DIAGNOSIS — I252 Old myocardial infarction: Secondary | ICD-10-CM | POA: Insufficient documentation

## 2018-09-03 DIAGNOSIS — N2581 Secondary hyperparathyroidism of renal origin: Secondary | ICD-10-CM | POA: Diagnosis not present

## 2018-09-03 DIAGNOSIS — D631 Anemia in chronic kidney disease: Secondary | ICD-10-CM | POA: Diagnosis not present

## 2018-09-03 DIAGNOSIS — K219 Gastro-esophageal reflux disease without esophagitis: Secondary | ICD-10-CM | POA: Insufficient documentation

## 2018-09-03 DIAGNOSIS — R0602 Shortness of breath: Secondary | ICD-10-CM | POA: Insufficient documentation

## 2018-09-03 DIAGNOSIS — I350 Nonrheumatic aortic (valve) stenosis: Secondary | ICD-10-CM | POA: Insufficient documentation

## 2018-09-03 DIAGNOSIS — Z89422 Acquired absence of other left toe(s): Secondary | ICD-10-CM | POA: Insufficient documentation

## 2018-09-03 DIAGNOSIS — N186 End stage renal disease: Secondary | ICD-10-CM | POA: Insufficient documentation

## 2018-09-03 DIAGNOSIS — I129 Hypertensive chronic kidney disease with stage 1 through stage 4 chronic kidney disease, or unspecified chronic kidney disease: Secondary | ICD-10-CM | POA: Insufficient documentation

## 2018-09-03 DIAGNOSIS — I2510 Atherosclerotic heart disease of native coronary artery without angina pectoris: Secondary | ICD-10-CM | POA: Insufficient documentation

## 2018-09-03 DIAGNOSIS — G629 Polyneuropathy, unspecified: Secondary | ICD-10-CM | POA: Insufficient documentation

## 2018-09-03 DIAGNOSIS — E859 Amyloidosis, unspecified: Secondary | ICD-10-CM | POA: Insufficient documentation

## 2018-09-04 ENCOUNTER — Other Ambulatory Visit: Payer: Self-pay

## 2018-09-04 ENCOUNTER — Inpatient Hospital Stay (HOSPITAL_BASED_OUTPATIENT_CLINIC_OR_DEPARTMENT_OTHER): Payer: Medicare Other | Admitting: Hematology

## 2018-09-04 ENCOUNTER — Ambulatory Visit: Payer: Medicare Other | Admitting: Family Medicine

## 2018-09-04 VITALS — BP 117/63 | HR 95 | Temp 98.3°F | Resp 18 | Ht 70.0 in | Wt 188.3 lb

## 2018-09-04 DIAGNOSIS — Z992 Dependence on renal dialysis: Secondary | ICD-10-CM | POA: Diagnosis not present

## 2018-09-04 DIAGNOSIS — I132 Hypertensive heart and chronic kidney disease with heart failure and with stage 5 chronic kidney disease, or end stage renal disease: Secondary | ICD-10-CM | POA: Diagnosis not present

## 2018-09-04 DIAGNOSIS — I214 Non-ST elevation (NSTEMI) myocardial infarction: Secondary | ICD-10-CM | POA: Diagnosis not present

## 2018-09-04 DIAGNOSIS — N186 End stage renal disease: Secondary | ICD-10-CM

## 2018-09-04 DIAGNOSIS — I252 Old myocardial infarction: Secondary | ICD-10-CM | POA: Diagnosis not present

## 2018-09-04 DIAGNOSIS — E538 Deficiency of other specified B group vitamins: Secondary | ICD-10-CM

## 2018-09-04 DIAGNOSIS — R0602 Shortness of breath: Secondary | ICD-10-CM

## 2018-09-04 DIAGNOSIS — Z48812 Encounter for surgical aftercare following surgery on the circulatory system: Secondary | ICD-10-CM | POA: Diagnosis not present

## 2018-09-04 DIAGNOSIS — R011 Cardiac murmur, unspecified: Secondary | ICD-10-CM | POA: Diagnosis not present

## 2018-09-04 DIAGNOSIS — K219 Gastro-esophageal reflux disease without esophagitis: Secondary | ICD-10-CM | POA: Diagnosis not present

## 2018-09-04 DIAGNOSIS — Z89422 Acquired absence of other left toe(s): Secondary | ICD-10-CM | POA: Diagnosis not present

## 2018-09-04 DIAGNOSIS — I739 Peripheral vascular disease, unspecified: Secondary | ICD-10-CM | POA: Diagnosis not present

## 2018-09-04 DIAGNOSIS — E859 Amyloidosis, unspecified: Secondary | ICD-10-CM | POA: Diagnosis not present

## 2018-09-04 DIAGNOSIS — I350 Nonrheumatic aortic (valve) stenosis: Secondary | ICD-10-CM | POA: Diagnosis not present

## 2018-09-04 DIAGNOSIS — I2510 Atherosclerotic heart disease of native coronary artery without angina pectoris: Secondary | ICD-10-CM | POA: Diagnosis not present

## 2018-09-04 DIAGNOSIS — I129 Hypertensive chronic kidney disease with stage 1 through stage 4 chronic kidney disease, or unspecified chronic kidney disease: Secondary | ICD-10-CM

## 2018-09-04 DIAGNOSIS — G629 Polyneuropathy, unspecified: Secondary | ICD-10-CM | POA: Diagnosis not present

## 2018-09-04 DIAGNOSIS — Z95828 Presence of other vascular implants and grafts: Secondary | ICD-10-CM | POA: Diagnosis not present

## 2018-09-04 DIAGNOSIS — E8581 Light chain (AL) amyloidosis: Secondary | ICD-10-CM

## 2018-09-04 NOTE — Progress Notes (Signed)
.    Hematology/Oncology clinic note  Date of service:  09/17/17  Patient Care Team: Patient, No Pcp Per as PCP - General (General Practice) McAlhany, Christopher D, MD as PCP - Cardiology (Cardiology) Goldsborough, Kellie, MD as Consulting Physician (Nephrology) Kale, Gautam Kishore, MD as Consulting Physician (Hematology) Dr Deterding as Nephrologist   CHIEF COMPLAINTS: followup for continued management of AL amyloidosis.  HISTORY OF PRESENTING ILLNESS: Please see my initial consultation for details on presentation.  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 AL amyloidosis noted on kidney biopsy diagnosed 09/28/2014.    previous treatment  CyBorD x 6 cycles Evaluated for and declined Auto HSCT at [facility]. - maintenance Velcade  Ixazomib + Dexamethasone s/p 6 cycles  Ixazomib maintenance q2weeks  Current treatment Active surveillance -off Maintenance Ixazomib due to concerns for neuropathy.  INTERVAL HISTORY:    Mr. Bennis returns today for management and evaluation of his AL amyloidosis. The patient's last visit with us was on 09/17/17. The pt reports that he is doing well overall.  The pt reports that he has had two heart attacks in the interim. He has a lost two toes on his left foot as well, noting his left leg went to 100% blockage very quickly. He notes that this has been improving. He has moved back in with his daughter for extra help at home. The pt has been on dialysis and notes that recent days have been a little rough on him. He has had many touch points with cardiology, vascular surgery, podiatry, and nephrology in the interim.  The pt notes that he gets "winded real easy." He chooses not to exert himself too much. He has an ECHO on 07/25/18 which revealed aortic stenosis and a LV EF of 35-40%.  Lab results (08/20/18) of CBC and BMP is as follows: all values are WNL except for RBC at 2.96, HGB at 9.5,  HCT at 29.9, MCV at 101.0, RDW at 16.9, PLT at 79k, Chloride at 94, Glucose at 109, BUN at 36, Creatinine at 8.65, Calcium at 8.3, GFR at 6.  On review of systems, pt reports dyspnea on exertion, stable energy levels, and denies abdominal pains, new bone pains, concerns for infections, and any other symptoms.   MEDICAL HISTORY:  Past Medical History:  Diagnosis Date  . Amyloidosis (HCC)   . Anemia Dx 2016  . Anxiety    "especially with procedure"- 07/21/2018  . Aortic stenosis   . CAD (coronary artery disease)    a. cardiac cath on 11/25/17 with occlusion of the RCA which could have been his event but the RCA could not be engaged. The RCA filled distally from left to right collaterals. There were no severe stenoses in the left coronary system. Medical therapy recommended, started on plavix.  . Cancer (HCC)   . Chronic combined systolic and diastolic CHF (congestive heart failure) (HCC)    a. EF dropped to 35-40% with grade 2 DD by echo 11/2017.  . Diarrhea   . ESRD (end stage renal disease) (HCC)    ESRD- HEMO MWF- [address]  . Family history of adverse reaction to anesthesia    "my daughter can't take certain anesthesia agents" (09/08/2014)  . GERD (gastroesophageal reflux disease)   . Heart murmur   . History of blood transfusion 09/08/2014   "got hematoma after renal biopsy & HgB dropped"  . Hyperlipidemia Dx 2012  . Hypertension Dx 2012  . Myocardial infarction (HCC) 11/2017  .   Neuropathy   . Peripheral vascular disease (HCC)   . Pneumonia   . Restless legs     SURGICAL HISTORY: Past Surgical History:  Procedure Laterality Date  . ABDOMINAL AORTOGRAM W/LOWER EXTREMITY Bilateral 07/16/2018   Procedure: ABDOMINAL AORTOGRAM W/LOWER EXTREMITY;  Surgeon: Arida, Muhammad A, MD;  [facility]: [facility];  Service: Cardiovascular;  Laterality: Bilateral;  . AMPUTATION Left 08/19/2018   Procedure: AMPUTATION LEFT TOES SECOND AND THIRD;  Surgeon: Cain, Brandon Christopher, MD;   Location: [facility] OR;  Service: Vascular;  Laterality: Left;  . AV FISTULA PLACEMENT Right 12/27/2015   Procedure: Right Arm ARTERIOVENOUS (AV) FISTULA CREATION;  Surgeon: Christopher S Dickson, MD;  Location: [facility] OR;  Service: Vascular;  Laterality: Right;  . COLONOSCOPY    . FEMORAL-POPLITEAL BYPASS GRAFT Left 07/22/2018   Procedure: left FEMORAL TO BELOW KNEE POPLITEAL ARTERY BYPASS GREAFT using 6mm removable ring propaten gore graft;  Surgeon: Cain, Brandon Christopher, MD;  Location: [facility];  Service: Vascular;  Laterality: Left;  . IR GENERIC HISTORICAL  01/27/2016   IR FLUORO GUIDE CV LINE RIGHT 01/27/2016 Daniel Hassell, MD [facility]  . IR GENERIC HISTORICAL  01/27/2016   IR US GUIDE VASC ACCESS RIGHT 01/27/2016 Daniel Hassell, MD [facility]  . LAPAROSCOPIC CHOLECYSTECTOMY  03/2011  . LEFT HEART CATH AND CORONARY ANGIOGRAPHY N/A 11/25/2017   Procedure: LEFT HEART CATH AND CORONARY ANGIOGRAPHY;  Surgeon: Jordan, Peter M, MD;  [facility];  Service: Cardiovascular;  Laterality: N/A;  . LEFT HEART CATH AND CORONARY ANGIOGRAPHY N/A 07/24/2018   Procedure: LEFT HEART CATH AND CORONARY ANGIOGRAPHY;  Surgeon: Cooper, Michael, MD;  [facility];  Service: Cardiovascular;  Laterality: N/A;  . RENAL BIOPSY, PERCUTANEOUS Right 09/08/2014  . RIGHT/LEFT HEART CATH AND CORONARY ANGIOGRAPHY N/A 03/25/2018   Procedure: RIGHT/LEFT HEART CATH AND CORONARY ANGIOGRAPHY;  Surgeon: Jordan, Peter M, MD;  [facility];  Service: Cardiovascular;  Laterality: N/A;  . TOE AMPUTATION Left 08/19/2018   2nd & 3rd toes  . TONSILLECTOMY  ~ 1960  . ULTRASOUND GUIDANCE FOR VASCULAR ACCESS  03/25/2018   Procedure: Ultrasound Guidance For Vascular Access;  Surgeon: Jordan, Peter M, MD;  [facility];  Service: Cardiovascular;;    SOCIAL HISTORY: Social History   Socioeconomic History  . Marital status: Divorced    Spouse name: Not on file  . Number of  children: Not on file  . Years of education: Not on file  . Highest education level: Not on file  Occupational History  . Not on file  Social Needs  . Financial resource strain: Not on file  . Food insecurity    Worry: Not on file    Inability: Not on file  . Transportation needs    Medical: Not on file    Non-medical: Not on file  Tobacco Use  . Smoking status: Former Smoker    Packs/day: 2.00    Years: 40.00    Pack years: 80.00    Types: Cigarettes    Quit date: 03/19/2011    Years since quitting: 7.4  . Smokeless tobacco: Never Used  Substance and Sexual Activity  . Alcohol use: No    Alcohol/week: 0.0 standard drinks  . Drug use: No    Comment: 09/08/2014 "I have used marijuana till the 1990's". Notes that he quit 15 yrs ago  . Sexual activity: Not Currently  Lifestyle  . Physical activity    Days per week:   Not on file    Minutes per session: Not on file  . Stress: Not on file  Relationships  . Social connections    Talks on phone: Not on file    Gets together: Not on file    Attends religious service: Not on file    Active member of club or organization: Not on file    Attends meetings of clubs or organizations: Not on file    Relationship status: Not on file  . Intimate partner violence    Fear of current or ex partner: Not on file    Emotionally abused: Not on file    Physically abused: Not on file    Forced sexual activity: Not on file  Other Topics Concern  . Not on file  Social History Narrative   Pt lives alone in a 1 story home   Has 3 adult daughters   Highest level of education: associates degree   Worked in industrial engineering.    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 Lives with his daughter and her finance.  FAMILY HISTORY: Family History  Problem Relation Age of Onset  . Hypertension Mother   . Diabetes Mother   . Heart disease Mother   . Skin cancer Mother   . Hypertension Father   . Diabetes Father    . Diabetes Sister     ALLERGIES:  has No Known Allergies.  MEDICATIONS:  Current Outpatient Medications  Medication Sig Dispense Refill  . acetaminophen (TYLENOL) 500 MG tablet Take 500-1,000 mg by mouth daily as needed (pain).     . amoxicillin-clavulanate (AUGMENTIN) 500-125 MG tablet 1/2 pill twice per day for 10 days; take after eating 10 tablet 0  . aspirin EC 81 MG tablet Take 1 tablet (81 mg total) by mouth daily. 30 tablet 11  . atorvastatin (LIPITOR) 80 MG tablet Take 1 tablet (80 mg total) by mouth daily with supper. 30 tablet 0  . B Complex-C-Folic Acid (RENA-VITE RX) 1 MG TABS Take 1 tablet by mouth daily.    . cephALEXin (KEFLEX) 250 MG capsule Take 250 mg by mouth 2 (two) times daily. Started 08/12/2018-  Med to be finished 08/22/2018    . cetirizine (ZYRTEC) 10 MG tablet Take 10 mg by mouth daily.    . clopidogrel (PLAVIX) 75 MG tablet Take 1 tablet (75 mg total) by mouth daily with supper. 30 tablet 0  . Darbepoetin Alfa (ARANESP) 200 MCG/0.4ML SOSY injection Inject 0.4 mLs (200 mcg total) into the vein every Wednesday with hemodialysis. 1.68 mL   . docusate sodium (COLACE) 100 MG capsule Take 1 capsule (100 mg total) by mouth 3 (three) times daily. 10 capsule 0  . gabapentin (NEURONTIN) 300 MG capsule Take 1 capsule (300 mg total) by mouth every morning. (Patient taking differently: Take 300-600 mg by mouth See admin instructions. 300 mg in the morning, and 600 mg twice daily, lunch and dinner) 30 capsule 0  . LORazepam (ATIVAN) 1 MG tablet Take 1 mg by mouth 2 (two) times daily as needed for anxiety.    . midodrine (PROAMATINE) 10 MG tablet Take 1 tablet (10 mg total) by mouth every Monday, Wednesday, and Friday with hemodialysis. 30 tablet 0  . multivitamin (RENA-VIT) TABS tablet Take 1 tablet by mouth at bedtime. 30 tablet 0  . mupirocin ointment (BACTROBAN) 2 % Apply 1 application topically daily.    . oxyCODONE (OXY IR/ROXICODONE) 5 MG immediate release tablet Take 1-2  tablets (5-10   mg total) by mouth every 6 (six) hours as needed for severe pain. (Patient taking differently: Take 5 mg by mouth every 6 (six) hours as needed for severe pain. ) 20 tablet 0  . oxyCODONE (OXY IR/ROXICODONE) 5 MG immediate release tablet Take 1-2 tablets (5-10 mg total) by mouth every 4 (four) hours as needed for moderate pain. 30 tablet 0  . pantoprazole (PROTONIX) 40 MG tablet Take 1 tablet (40 mg total) by mouth daily. 30 tablet 0  . sucroferric oxyhydroxide (VELPHORO) 500 MG chewable tablet Chew 2 tablets (1,000 mg total) by mouth 3 (three) times daily with meals. 90 tablet 0  . vitamin B-12 (CYANOCOBALAMIN) 1000 MCG tablet Take 1,000 mcg by mouth daily.     No current facility-administered medications for this visit.     REVIEW OF SYSTEMS:    A 10+ POINT REVIEW OF SYSTEMS WAS OBTAINED including neurology, dermatology, psychiatry, cardiac, respiratory, lymph, extremities, GI, GU, Musculoskeletal, constitutional, breasts, reproductive, HEENT.  All pertinent positives are noted in the HPI.  All others are negative.   PHYSICAL EXAMINATION: ECOG PERFORMANCE STATUS: 2 - Symptomatic, <50% confined to bed  Vitals:   09/04/18 1454  BP: 117/63  Pulse: 95  Resp: 18  Temp: 98.3 F (36.8 C)  SpO2: 99%   Filed Weights   09/04/18 1454  Weight: 188 lb 4.8 oz (85.4 kg)    HEART: regular rate & rhythm, 2/6 SM over the aortic area GENERAL:alert, in no acute distress and comfortable SKIN: no acute rashes, no significant lesions EYES: conjunctiva are pink and non-injected, sclera anicteric OROPHARYNX: MMM, no exudates, no oropharyngeal erythema or ulceration NECK: supple, no JVD LYMPH:  no palpable lymphadenopathy in the cervical, axillary or inguinal regions LUNGS: clear to auscultation b/l with normal respiratory effort HEART: regular rate & rhythm, 2/6 SM over the aortic area ABDOMEN:  normoactive bowel sounds , non tender, not distended. No palpable hepatosplenomegaly.   Extremity: no pedal edema PSYCH: alert & oriented x 3 with fluent speech NEURO: no focal motor/sensory deficits   LABORATORY DATA:   . CBC Latest Ref Rng & Units 08/20/2018 08/19/2018 08/13/2018  WBC 4.0 - 10.5 K/uL 5.8 4.7 6.1  Hemoglobin 13.0 - 17.0 g/dL 9.5(L) 10.5(L) 11.2(L)  Hematocrit 39.0 - 52.0 % 29.9(L) 33.9(L) 35.0(L)  Platelets 150 - 400 K/uL 79(L) 95(L) 128(L)  HGB 11.4 . CMP Latest Ref Rng & Units 08/20/2018 08/19/2018 08/13/2018  Glucose 70 - 99 mg/dL 109(H) 106(H) 136(H)  BUN 8 - 23 mg/dL 36(H) 28(H) 41(H)  Creatinine 0.61 - 1.24 mg/dL 8.65(H) 7.12(H) 7.91(H)  Sodium 135 - 145 mmol/L 136 137 133(L)  Potassium 3.5 - 5.1 mmol/L 4.5 3.6 4.1  Chloride 98 - 111 mmol/L 94(L) 94(L) 95(L)  CO2 22 - 32 mmol/L 27 26 21(L)  Calcium 8.9 - 10.3 mg/dL 8.3(L) 8.7(L) 9.5  Total Protein 6.5 - 8.1 g/dL - - -  Total Bilirubin 0.3 - 1.2 mg/dL - - -  Alkaline Phos 38 - 126 U/L - - -  AST 15 - 41 U/L - - -  ALT 0 - 44 U/L - - -        Echocardiogram 05/02/2017   *[place]*                   *[facility]*                         [address]                          [place], [place] [address]                            [phone]  ------------------------------------------------------------------- Transthoracic Echocardiography  Patient:    Berrian, Soma E MR #:       [id number] Study Date: 05/02/2017 Gender:     M Age:        67 Height:     170.2 cm Weight:     92.4 kg BSA:        2.12 m^2 Pt. Status: Room:   PERFORMING   [facility], Outpatient  ATTENDING    Kale, Gautam Kishore  ORDERING     Kale, Gautam Kishore  REFERRING    Kale, Gautam Kishore  SONOGRAPHER  Vijay Shankar, RDCS  cc:  ------------------------------------------------------------------- LV EF: 60% -   65%  ------------------------------------------------------------------- History:   PMH:  Amyloidosis. Enlarged Heart. F/U for Amyloidosis. Risk factors:  Hypertension.  Dyslipidemia.  ------------------------------------------------------------------- Study Conclusions  - Left ventricle: The cavity size was normal. Wall thickness was   increased in a pattern of moderate LVH. Systolic function was   normal. The estimated ejection fraction was in the range of 60%   to 65%. Wall motion was normal; there were no regional wall   motion abnormalities. Doppler parameters are consistent with   abnormal left ventricular relaxation (grade 1 diastolic   dysfunction). Strain pattern abnormal in the basal to mid wall   and preserved in the apex. - Aortic valve: Trileaflet; severely calcified leaflets. There was   mild stenosis. Mean gradient (S): 19 mm Hg. Valve area (VTI):   1.72 cm^2. - Mitral valve: There was no significant regurgitation. - Right ventricle: The cavity size was normal. Systolic function   was normal. - Pulmonary arteries: No complete TR doppler jet so unable to   estimate PA systolic pressure. - Inferior vena cava: The vessel was normal in size. The   respirophasic diameter changes were in the normal range (= 50%),   consistent with normal central venous pressure. - Pericardium, extracardiac: A trivial pericardial effusion was   identified.  Impressions:  - Normal LV size with moderate LV hypertrophy. EF 60-65%. Bull&'s   eye strain pattern can be seen with cardiac amyloidosis. Normal   RV size and systolic function. Mild aortic stenosis. Trivial   pericardial effusion. This study is suggestive of cardiac   amyloidosis.  RADIOGRAPHIC STUDIES: I have personally reviewed the radiological images as listed and agreed with the findings in the report.   ASSESSMENT & PLAN:   Mr Vandoren is a very pleasant 68 y.o. caucasian male with   1) AL Amyloidosis noted on renal biopsy with nephrotic range proteinuria on urinalysis and some lambda free light chains on serum and urinary IFE . On diagnosis:  SPEP with small amount of M-spike 0.3   Echo showed no systolic dysfunction but grade 2 diastolic dysfunction. [facility] cardiac staging 1 as per troponin and BNP criteria. Bone marrow with no overt evidence of multiple myeloma. PET/CT scan showed no evidence of bony lesions or lymphadenopathy . Urine showed about 15 g of protein in 24 hours   Patient is status post CyBorD x 6 cycles He was then on maintenance Velcade and had progression of his AL Amyloidosis. He was then switched to Ixazomib and Dexamethasone and has completed 6 cycles without significant toxicities. Echo repeated on 12/11/2016 shows no changes in heart function compared to previous echo from earlier this year.  Myeloma   panel/SFLC WNL with no evidence of progressive disease. ECHO repeated on 05/02/17 shows Normal LV size with moderate LV hypertrophy. EF 60-65%. Bull&'s eye strain pattern can be seen with cardiac amyloidosis. Normal RV size and systolic function. Mild aortic stenosis. Trivial pericardial effusion. This study is suggestive of cardiac amyloidosis.  Maintenance Ixazomib q2weeks since 2017 and tolerating it without any significant new toxicities. Worsened multifactorial neuropathy. Held Ixazomib since 03/14/17.     2) Thrombocytopenia mild - will continue to monitor  Plan:  -Discussed pt labwork from 08/20/18; blood counts and chemistries are stable. -Pt prefers not to check labs today regarding his AL Amyloidosis  -Will check labs again in 4-6 months -Has grade 2 neuropathy from previous Velcade/B12 def /HD with additional radiculopathy due to back issues. -given his persistent and bothersome neuropathy he has elected to hold his Ixazomib since 03/14/17. Continue to hold Ixazomib  -continue on low-dose aspirin for VTE prophylaxis given his high risk of venous thromboembolism in the setting of nephrotic syndrome.  -Will see the pt back in 6 months  3) multifactorial anemia - likely multifactorial -from primarily anemia from CKD. HGB at 9.0, trending  down B12 WNL -Previously deficient. B12 07/12/2016 -- 1035, B12 WNL today Plan  -continue B12 replacement SL - will adjust dose to maintain B12 levels > 400 - EPO shots & IV Iron per nephrology.  4) ESRD on HD Plan  -Continue hemodialysis as per nephrologist.   5) Grade 1-2 peripheral neuropathy, worsening recently. Feet tingling and numbness ? Related to AL Amyloidosis vs Velcade vs B12 deficiency vs HD (now)vs radiculopathy from DDD  stable.   EMG/NCS 03/2017- There is a chronic and symmetric axonal sensorimotor polyneuropathy affecting the lower extremities, which was previously not present on his study dated 10/12/2014. These findings are severe in degree electrically.  There is evidence of a superimposed L3-4 radiculopathy affecting bilateral lower extremities, and worse on the right.  RTC with Dr Kale with labs in 6 months   All questions were answered. The patient knows to call the clinic with any problems, questions or concerns.  The total time spent in the appt was 25 minutes and more than 50% was on counseling and direct patient cares.    Gautam Kale MD MS Hematology/Oncology Physician [facility]  (Office):       [phone] (Work cell):  [phone] (Fax):           [phone]  I, Schuyler Bain, am acting as a scribe for Dr. Gautam Kale.   .I have reviewed the above documentation for accuracy and completeness, and I agree with the above. .Gautam Kishore Kale MD  

## 2018-09-05 ENCOUNTER — Ambulatory Visit (HOSPITAL_COMMUNITY): Payer: Medicare Other

## 2018-09-05 ENCOUNTER — Telehealth: Payer: Self-pay | Admitting: Hematology

## 2018-09-05 DIAGNOSIS — D631 Anemia in chronic kidney disease: Secondary | ICD-10-CM | POA: Diagnosis not present

## 2018-09-05 DIAGNOSIS — N186 End stage renal disease: Secondary | ICD-10-CM | POA: Diagnosis not present

## 2018-09-05 DIAGNOSIS — D509 Iron deficiency anemia, unspecified: Secondary | ICD-10-CM | POA: Diagnosis not present

## 2018-09-05 DIAGNOSIS — I739 Peripheral vascular disease, unspecified: Secondary | ICD-10-CM | POA: Diagnosis not present

## 2018-09-05 DIAGNOSIS — N2581 Secondary hyperparathyroidism of renal origin: Secondary | ICD-10-CM | POA: Diagnosis not present

## 2018-09-05 DIAGNOSIS — I2510 Atherosclerotic heart disease of native coronary artery without angina pectoris: Secondary | ICD-10-CM | POA: Diagnosis not present

## 2018-09-05 DIAGNOSIS — Z95828 Presence of other vascular implants and grafts: Secondary | ICD-10-CM | POA: Diagnosis not present

## 2018-09-05 DIAGNOSIS — I132 Hypertensive heart and chronic kidney disease with heart failure and with stage 5 chronic kidney disease, or end stage renal disease: Secondary | ICD-10-CM | POA: Diagnosis not present

## 2018-09-05 DIAGNOSIS — I214 Non-ST elevation (NSTEMI) myocardial infarction: Secondary | ICD-10-CM | POA: Diagnosis not present

## 2018-09-05 DIAGNOSIS — Z48812 Encounter for surgical aftercare following surgery on the circulatory system: Secondary | ICD-10-CM | POA: Diagnosis not present

## 2018-09-05 NOTE — Telephone Encounter (Signed)
Scheduled appt per 6/25 los. Spoke with patient and he is aware of his appt date and time.

## 2018-09-08 DIAGNOSIS — D631 Anemia in chronic kidney disease: Secondary | ICD-10-CM | POA: Diagnosis not present

## 2018-09-08 DIAGNOSIS — D509 Iron deficiency anemia, unspecified: Secondary | ICD-10-CM | POA: Diagnosis not present

## 2018-09-08 DIAGNOSIS — N2581 Secondary hyperparathyroidism of renal origin: Secondary | ICD-10-CM | POA: Diagnosis not present

## 2018-09-08 DIAGNOSIS — N186 End stage renal disease: Secondary | ICD-10-CM | POA: Diagnosis not present

## 2018-09-10 DIAGNOSIS — G620 Drug-induced polyneuropathy: Secondary | ICD-10-CM | POA: Diagnosis not present

## 2018-09-10 DIAGNOSIS — D509 Iron deficiency anemia, unspecified: Secondary | ICD-10-CM | POA: Diagnosis not present

## 2018-09-10 DIAGNOSIS — Z48812 Encounter for surgical aftercare following surgery on the circulatory system: Secondary | ICD-10-CM | POA: Diagnosis not present

## 2018-09-10 DIAGNOSIS — Z7902 Long term (current) use of antithrombotics/antiplatelets: Secondary | ICD-10-CM | POA: Diagnosis not present

## 2018-09-10 DIAGNOSIS — I132 Hypertensive heart and chronic kidney disease with heart failure and with stage 5 chronic kidney disease, or end stage renal disease: Secondary | ICD-10-CM | POA: Diagnosis not present

## 2018-09-10 DIAGNOSIS — Z95828 Presence of other vascular implants and grafts: Secondary | ICD-10-CM | POA: Diagnosis not present

## 2018-09-10 DIAGNOSIS — I5042 Chronic combined systolic (congestive) and diastolic (congestive) heart failure: Secondary | ICD-10-CM | POA: Diagnosis not present

## 2018-09-10 DIAGNOSIS — N2581 Secondary hyperparathyroidism of renal origin: Secondary | ICD-10-CM | POA: Diagnosis not present

## 2018-09-10 DIAGNOSIS — I129 Hypertensive chronic kidney disease with stage 1 through stage 4 chronic kidney disease, or unspecified chronic kidney disease: Secondary | ICD-10-CM | POA: Diagnosis not present

## 2018-09-10 DIAGNOSIS — N186 End stage renal disease: Secondary | ICD-10-CM | POA: Diagnosis not present

## 2018-09-10 DIAGNOSIS — I739 Peripheral vascular disease, unspecified: Secondary | ICD-10-CM | POA: Diagnosis not present

## 2018-09-10 DIAGNOSIS — D638 Anemia in other chronic diseases classified elsewhere: Secondary | ICD-10-CM | POA: Diagnosis not present

## 2018-09-10 DIAGNOSIS — I2510 Atherosclerotic heart disease of native coronary artery without angina pectoris: Secondary | ICD-10-CM | POA: Diagnosis not present

## 2018-09-10 DIAGNOSIS — R609 Edema, unspecified: Secondary | ICD-10-CM | POA: Diagnosis not present

## 2018-09-10 DIAGNOSIS — D631 Anemia in chronic kidney disease: Secondary | ICD-10-CM | POA: Diagnosis not present

## 2018-09-10 DIAGNOSIS — Z7982 Long term (current) use of aspirin: Secondary | ICD-10-CM | POA: Diagnosis not present

## 2018-09-10 DIAGNOSIS — I214 Non-ST elevation (NSTEMI) myocardial infarction: Secondary | ICD-10-CM | POA: Diagnosis not present

## 2018-09-10 DIAGNOSIS — T451X5A Adverse effect of antineoplastic and immunosuppressive drugs, initial encounter: Secondary | ICD-10-CM | POA: Diagnosis not present

## 2018-09-10 DIAGNOSIS — Z992 Dependence on renal dialysis: Secondary | ICD-10-CM | POA: Diagnosis not present

## 2018-09-11 ENCOUNTER — Ambulatory Visit: Payer: Medicare Other | Attending: Family Medicine | Admitting: Family Medicine

## 2018-09-11 ENCOUNTER — Other Ambulatory Visit: Payer: Self-pay

## 2018-09-11 ENCOUNTER — Encounter: Payer: Self-pay | Admitting: Family Medicine

## 2018-09-11 VITALS — BP 111/68 | HR 91 | Temp 98.1°F | Ht 70.0 in | Wt 189.0 lb

## 2018-09-11 DIAGNOSIS — F419 Anxiety disorder, unspecified: Secondary | ICD-10-CM | POA: Diagnosis not present

## 2018-09-11 DIAGNOSIS — I739 Peripheral vascular disease, unspecified: Secondary | ICD-10-CM | POA: Diagnosis present

## 2018-09-11 DIAGNOSIS — I5042 Chronic combined systolic (congestive) and diastolic (congestive) heart failure: Secondary | ICD-10-CM

## 2018-09-11 DIAGNOSIS — I132 Hypertensive heart and chronic kidney disease with heart failure and with stage 5 chronic kidney disease, or end stage renal disease: Secondary | ICD-10-CM | POA: Diagnosis not present

## 2018-09-11 DIAGNOSIS — Z95828 Presence of other vascular implants and grafts: Secondary | ICD-10-CM | POA: Diagnosis not present

## 2018-09-11 DIAGNOSIS — I2510 Atherosclerotic heart disease of native coronary artery without angina pectoris: Secondary | ICD-10-CM | POA: Diagnosis not present

## 2018-09-11 DIAGNOSIS — T451X5A Adverse effect of antineoplastic and immunosuppressive drugs, initial encounter: Secondary | ICD-10-CM | POA: Diagnosis not present

## 2018-09-11 DIAGNOSIS — Z87891 Personal history of nicotine dependence: Secondary | ICD-10-CM | POA: Insufficient documentation

## 2018-09-11 DIAGNOSIS — R609 Edema, unspecified: Secondary | ICD-10-CM

## 2018-09-11 DIAGNOSIS — Z79899 Other long term (current) drug therapy: Secondary | ICD-10-CM | POA: Insufficient documentation

## 2018-09-11 DIAGNOSIS — E785 Hyperlipidemia, unspecified: Secondary | ICD-10-CM | POA: Insufficient documentation

## 2018-09-11 DIAGNOSIS — Z48812 Encounter for surgical aftercare following surgery on the circulatory system: Secondary | ICD-10-CM | POA: Diagnosis not present

## 2018-09-11 DIAGNOSIS — K219 Gastro-esophageal reflux disease without esophagitis: Secondary | ICD-10-CM | POA: Diagnosis not present

## 2018-09-11 DIAGNOSIS — Z7902 Long term (current) use of antithrombotics/antiplatelets: Secondary | ICD-10-CM | POA: Diagnosis not present

## 2018-09-11 DIAGNOSIS — Z992 Dependence on renal dialysis: Secondary | ICD-10-CM | POA: Diagnosis not present

## 2018-09-11 DIAGNOSIS — Z833 Family history of diabetes mellitus: Secondary | ICD-10-CM | POA: Diagnosis not present

## 2018-09-11 DIAGNOSIS — I252 Old myocardial infarction: Secondary | ICD-10-CM | POA: Diagnosis not present

## 2018-09-11 DIAGNOSIS — Z7982 Long term (current) use of aspirin: Secondary | ICD-10-CM | POA: Insufficient documentation

## 2018-09-11 DIAGNOSIS — Z89422 Acquired absence of other left toe(s): Secondary | ICD-10-CM | POA: Diagnosis not present

## 2018-09-11 DIAGNOSIS — D638 Anemia in other chronic diseases classified elsewhere: Secondary | ICD-10-CM | POA: Diagnosis not present

## 2018-09-11 DIAGNOSIS — N186 End stage renal disease: Secondary | ICD-10-CM

## 2018-09-11 DIAGNOSIS — G620 Drug-induced polyneuropathy: Secondary | ICD-10-CM | POA: Diagnosis not present

## 2018-09-11 DIAGNOSIS — Z808 Family history of malignant neoplasm of other organs or systems: Secondary | ICD-10-CM | POA: Diagnosis not present

## 2018-09-11 DIAGNOSIS — I350 Nonrheumatic aortic (valve) stenosis: Secondary | ICD-10-CM | POA: Diagnosis not present

## 2018-09-11 DIAGNOSIS — Z8249 Family history of ischemic heart disease and other diseases of the circulatory system: Secondary | ICD-10-CM | POA: Diagnosis not present

## 2018-09-11 DIAGNOSIS — R600 Localized edema: Secondary | ICD-10-CM

## 2018-09-11 DIAGNOSIS — I214 Non-ST elevation (NSTEMI) myocardial infarction: Secondary | ICD-10-CM | POA: Diagnosis not present

## 2018-09-11 MED ORDER — AMOXICILLIN-POT CLAVULANATE 500-125 MG PO TABS: ORAL_TABLET | ORAL | 0 refills | Status: DC

## 2018-09-11 NOTE — Progress Notes (Signed)
Established Patient Office Visit  Subjective:  Patient ID: RICKARD KENNERLY, male    DOB: 1950/04/17  Age: 68 y.o. MRN: [id number]  CC:  Chief Complaint  Patient presents with  . Foot Problem    HPI KENYAN KARNES presents for follow-up s/p recent hospitalization for left femoral to below-knee popliteal bypass graft with 6 mm ringed PTFE on 07/22/2018 and rehospitalization due to the need for amputation of the left second and third toes on 08/19/2018.  Patient was seen in the office on 08/21/2018 status post hospitalization and to establish care as a new patient.  Patient unfortunately has multiple medical problems including end-stage renal disease secondary to amyloidosis for which patient receives hemodialysis, anemia, CHF, aortic stenosis patient with coronary artery disease, peripheral neuropathy.  Since his last visit, patient has also had routine postop follow-up with his vascular surgeon with most recent visit being on 08/29/2018. Patient was reevaluated status post amputation of the left second and third toes as well as reevaluation of a hematoma at the left groin incision site for his femoropopliteal bypass.      At today's visit, patient reports that he is gradually feeling better.  He reports that he has had no recent drainage from the site of his toe amputations on the left foot.  He has had no increased pain or redness at the area.  Patient would like to have a prescription for antibiotic to have at home just in case.  He has followed up with his vascular doctor as well as with cardiology.  Patient denies any chest pain but does have shortness of breath with exertion.  Patient also fatigues easily.  He is now able to use a cane for ambulation as this also helps with balance.  He is having some swelling in his lower legs and feet.  Swelling generally goes down overnight.  Patient also has numbness and pain in his feet and he states that he was recently told that this was likely related to his  prior chemotherapy which has caused nerve damage.       He continues to attend dialysis.  He denies any issues with unusual bruising or bleeding.  He does have chronic anemia.  He sometimes does feel slightly dizzy at times/lightheaded with changes in position.  He has had no falls. He realizes that he has a lot of medical issues.   Past Medical History:  Diagnosis Date  . Amyloidosis ([place])   . Anemia Dx 2016  . Anxiety    "especially with procedure"- 07/21/2018  . Aortic stenosis   . CAD (coronary artery disease)    a. cardiac cath on 11/25/17 with occlusion of the RCA which could have been his event but the RCA could not be engaged. The RCA filled distally from left to right collaterals. There were no severe stenoses in the left coronary system. Medical therapy recommended, started on plavix.  . Cancer ([place])   . Chronic combined systolic and diastolic CHF (congestive heart failure) ([place])    a. EF dropped to 35-40% with grade 2 DD by echo 11/2017.  [name] Diarrhea   . ESRD (end stage renal disease) ([place])    ESRD- HEMO MWF- [facility]  . Family history of adverse reaction to anesthesia    "my daughter can't take certain anesthesia agents" (09/08/2014)  . GERD (gastroesophageal reflux disease)   . Heart murmur   . History of blood transfusion 09/08/2014   "got hematoma after renal biopsy & HgB dropped"  .  Hyperlipidemia Dx 2012  . Hypertension Dx 2012  . Myocardial infarction ([place]) 11/2017  . Neuropathy   . Peripheral vascular disease ([place])   . Pneumonia   . Restless legs     Past Surgical History:  Procedure Laterality Date  . ABDOMINAL AORTOGRAM W/LOWER EXTREMITY Bilateral 07/16/2018   Procedure: ABDOMINAL AORTOGRAM W/LOWER EXTREMITY;  Surgeon: Wellington Hampshire, MD;  Location: [facility];  Service: Cardiovascular;  Laterality: Bilateral;  . AMPUTATION Left 08/19/2018   Procedure: AMPUTATION LEFT TOES SECOND AND THIRD;  Surgeon: Waynetta Sandy, MD;  Location: [facility];   Service: Vascular;  Laterality: Left;  . AV FISTULA PLACEMENT Right 12/27/2015   Procedure: Right Arm ARTERIOVENOUS (AV) FISTULA CREATION;  Surgeon: Angelia Mould, MD;  Location: [facility];  Service: Vascular;  Laterality: Right;  . COLONOSCOPY    . FEMORAL-POPLITEAL BYPASS GRAFT Left 07/22/2018   Procedure: left FEMORAL TO BELOW KNEE POPLITEAL ARTERY BYPASS GREAFT using 89m removable ring propaten gore graft;  Surgeon: CWaynetta Sandy MD;  Location: [facility]  Service: Vascular;  Laterality: Left;  . IR GENERIC HISTORICAL  01/27/2016   IR FLUORO GUIDE CV LINE RIGHT 01/27/2016 DArne Cleveland MD [facility]  . IR GENERIC HISTORICAL  01/27/2016   IR [place]ASC ACCESS RIGHT 01/27/2016 DArne Cleveland MD [facility]  . LAPAROSCOPIC CHOLECYSTECTOMY  03/2011  . LEFT HEART CATH AND CORONARY ANGIOGRAPHY N/A 11/25/2017   Procedure: LEFT HEART CATH AND CORONARY ANGIOGRAPHY;  Surgeon: JMartinique Peter M, MD;  [facility];  Service: Cardiovascular;  Laterality: N/A;  . LEFT HEART CATH AND CORONARY ANGIOGRAPHY N/A 07/24/2018   Procedure: LEFT HEART CATH AND CORONARY ANGIOGRAPHY;  Surgeon: CSherren Mocha MD;  [facility];  Service: Cardiovascular;  Laterality: N/A;  . RENAL BIOPSY, PERCUTANEOUS Right 09/08/2014  . RIGHT/LEFT HEART CATH AND CORONARY ANGIOGRAPHY N/A 03/25/2018   Procedure: RIGHT/LEFT HEART CATH AND CORONARY ANGIOGRAPHY;  Surgeon: JMartinique Peter M, MD;  [facility];  Service: Cardiovascular;  Laterality: N/A;  . TOE AMPUTATION Left 08/19/2018   2nd & 3rd toes  . TONSILLECTOMY  ~ 1960  . ULTRASOUND GUIDANCE FOR VASCULAR ACCESS  03/25/2018   Procedure: Ultrasound Guidance For Vascular Access;  Surgeon: JMartinique Peter M, MD;  [facility];  Service: Cardiovascular;;    Family History  Problem Relation Age of Onset  . Hypertension Mother   . Diabetes Mother   . Heart disease Mother   . Skin cancer Mother   .  Hypertension Father   . Diabetes Father   . Diabetes Sister     Social History   Tobacco Use  . Smoking status: Former Smoker    Packs/day: 2.00    Years: 40.00    Pack years: 80.00    Types: Cigarettes    Quit date: 03/19/2011    Years since quitting: 7.4  . Smokeless tobacco: Never Used  Substance Use Topics  . Alcohol use: No    Alcohol/week: 0.0 standard drinks  . Drug use: No    Comment: 09/08/2014 "I have used marijuana till the 1990's". Notes that he quit 15 yrs ago    Outpatient Medications Prior to Visit  Medication Sig Dispense Refill  . acetaminophen (TYLENOL) 500 MG tablet Take 500-1,000 mg by mouth daily as needed (pain).     .Marland Kitchenamoxicillin-clavulanate (AUGMENTIN) 500-125 MG tablet 1/2 pill twice per day for 10 days; take after eating (Patient not taking: Reported  on 09/05/2018) 10 tablet 0  . aspirin EC 81 MG tablet Take 1 tablet (81 mg total) by mouth daily. 30 tablet 11  . atorvastatin (LIPITOR) 80 MG tablet Take 1 tablet (80 mg total) by mouth daily with supper. 30 tablet 0  . B Complex-C-Folic Acid (RENA-VITE RX) 1 MG TABS Take 1 tablet by mouth daily.    . cephALEXin (KEFLEX) 250 MG capsule Take 250 mg by mouth 2 (two) times daily. Started 08/12/2018-  Med to be finished 08/22/2018    . cetirizine (ZYRTEC) 10 MG tablet Take 10 mg by mouth daily.    . clopidogrel (PLAVIX) 75 MG tablet Take 1 tablet (75 mg total) by mouth daily with supper. 30 tablet 0  . Darbepoetin Alfa (ARANESP) 200 MCG/0.4ML SOSY injection Inject 0.4 mLs (200 mcg total) into the vein every Wednesday with hemodialysis. 1.68 mL   . docusate sodium (COLACE) 100 MG capsule Take 1 capsule (100 mg total) by mouth 3 (three) times daily. 10 capsule 0  . gabapentin (NEURONTIN) 300 MG capsule Take 1 capsule (300 mg total) by mouth every morning. (Patient taking differently: Take 300-600 mg by mouth See admin instructions. 300 mg in the morning, and 600 mg twice daily, lunch and dinner) 30 capsule 0  . LORazepam  (ATIVAN) 1 MG tablet Take 1 mg by mouth 2 (two) times daily as needed for anxiety.    . midodrine (PROAMATINE) 10 MG tablet Take 1 tablet (10 mg total) by mouth every Monday, Wednesday, and Friday with hemodialysis. 30 tablet 0  . multivitamin (RENA-VIT) TABS tablet Take 1 tablet by mouth at bedtime. 30 tablet 0  . mupirocin ointment (BACTROBAN) 2 % Apply 1 application topically daily.    [name] oxyCODONE (OXY IR/ROXICODONE) 5 MG immediate release tablet Take 1-2 tablets (5-10 mg total) by mouth every 6 (six) hours as needed for severe pain. (Patient not taking: Reported on 09/05/2018) 20 tablet 0  . oxyCODONE (OXY IR/ROXICODONE) 5 MG immediate release tablet Take 1-2 tablets (5-10 mg total) by mouth every 4 (four) hours as needed for moderate pain. 30 tablet 0  . pantoprazole (PROTONIX) 40 MG tablet Take 1 tablet (40 mg total) by mouth daily. 30 tablet 0  . sucroferric oxyhydroxide (VELPHORO) 500 MG chewable tablet Chew 2 tablets (1,000 mg total) by mouth 3 (three) times daily with meals. 90 tablet 0  . vitamin B-12 (CYANOCOBALAMIN) 1000 MCG tablet Take 1,000 mcg by mouth daily.     No facility-administered medications prior to visit.     No Known Allergies  ROS Review of Systems  Constitutional: Positive for fatigue. Negative for chills and fever.  HENT: Negative for sore throat and trouble swallowing.   Respiratory: Positive for shortness of breath (with exertion). Negative for cough.   Cardiovascular: Positive for leg swelling. Negative for chest pain and palpitations.  Gastrointestinal: Positive for diarrhea (occasional). Negative for abdominal pain, constipation, nausea and vomiting.  Endocrine: Negative for polydipsia, polyphagia and polyuria.  Genitourinary: Negative for dysuria and frequency.  Musculoskeletal: Positive for arthralgias and gait problem.  Skin: Positive for wound. Negative for rash.  Neurological: Positive for dizziness, weakness, light-headedness and numbness. Negative  for headaches.  Hematological: Negative for adenopathy. Does not bruise/bleed easily.  Psychiatric/Behavioral: Negative for self-injury and suicidal ideas.      Objective:    Physical Exam  Constitutional: He is oriented to person, place, and time. He appears well-developed and well-nourished.  Elderly male in NAD sitting on chair in exam room holding  onto a cane  Neck: Normal range of motion. Neck supple.  Right carotid bruit  Cardiovascular: Normal rate and regular rhythm.  Murmur (holosystolic) heard. Pulmonary/Chest: Effort normal and breath sounds normal.  Abdominal: Soft. There is no abdominal tenderness. There is no rebound and no guarding.  Musculoskeletal:        General: Edema (bilateral pedal edema, 2 plus and 1 plus edema to midshin then trace) present. No tenderness.  Lymphadenopathy:    He has no cervical adenopathy.  Neurological: He is alert and oriented to person, place, and time.  Skin: Skin is warm and dry.  Some dry flaky skin on the left heel and generalized dry skin on LE/feet; amputation site of left 2nd and 3rd toes has no drainage. Scabbing/healing of incision site. Feet are warm with good perfusion.  Psychiatric: He has a normal mood and affect. His behavior is normal. Judgment and thought content normal.  Nursing note and vitals reviewed.   There were no vitals taken for this visit. Wt Readings from Last 3 Encounters:  09/04/18 188 lb 4.8 oz (85.4 kg)  08/29/18 184 lb (83.5 kg)  08/21/18 184 lb (83.5 kg)     Health Maintenance Due  Topic Date Due  . Hepatitis C Screening  [birth year]  . COLONOSCOPY  05/02/2002  . PNA vac Low Risk Adult (1 of 2 - PCV13) 05/02/2017    There are no preventive care reminders to display for this patient.  Lab Results  Component Value Date   TSH 1.591 03/26/2018   Lab Results  Component Value Date   WBC 5.8 08/20/2018   HGB 9.5 (L) 08/20/2018   HCT 29.9 (L) 08/20/2018   MCV 101.0 (H) 08/20/2018   PLT 79 (L)  08/20/2018   Lab Results  Component Value Date   NA 136 08/20/2018   K 4.5 08/20/2018   CHLORIDE 93 (L) 03/14/2017   CO2 27 08/20/2018   GLUCOSE 109 (H) 08/20/2018   BUN 36 (H) 08/20/2018   CREATININE 8.65 (H) 08/20/2018   BILITOT 0.7 07/22/2018   ALKPHOS 55 07/22/2018   AST 16 07/22/2018   ALT 14 07/22/2018   PROT 6.8 07/22/2018   ALBUMIN 3.3 (L) 08/13/2018   CALCIUM 8.3 (L) 08/20/2018   ANIONGAP 15 08/20/2018   EGFR 6 (L) 03/14/2017   Lab Results  Component Value Date   CHOL 117 02/12/2018   Lab Results  Component Value Date   HDL 32 (L) 02/12/2018   Lab Results  Component Value Date   LDLCALC 50 02/12/2018   Lab Results  Component Value Date   TRIG 174 (H) 02/12/2018   Lab Results  Component Value Date   CHOLHDL 3.7 02/12/2018   Lab Results  Component Value Date   HGBA1C 6.2 (H) 03/26/2018      Assessment & Plan:  1. Peripheral arterial disease ([place]) Patient is currently stable.  Patient's recent note from vascular surgery reviewed.  Continue daily aspirin and Plavix.  Continue Lipitor.  He appears to have adequate peripheral perfusion at this time.  2. Chronic combined systolic and diastolic heart failure ([place]); 6.  Peripheral edema Patient does have some lower extremity edema today's visit.  No rales on lung exam.  Patient is encouraged to elevate his feet when he returns home today.  Some of the swelling may be related to his increased ambulation/dependent edema.  Patient has dialysis tomorrow which will help with fluid regulation.  3. End stage renal disease on dialysis [facility]) Patient remains compliant  with his dialysis and additionally has blood work done in follow-up of his anemia and renal function by nephrology  4. Peripheral neuropathy due to chemotherapy [facility]) Continue use of gabapentin and B-complex multivitamin  5. Anemia of chronic disease Patient with anemia of chronic disease which is likely contributing to his fatigue.  Patient is  followed by nephrology and has regular follow-up of his blood counts.  An After Visit Summary was printed and given to the patient.   Follow-up: Return in about 4 months (around 01/12/2019), or if symptoms worsen or fail to improve, for chronic issues. Patient has regular medical follow-up with nephrology and cardiology as well as vascular surgery. He believes that he will likely be given his influenza immunization at Nephrology   [name], MD

## 2018-09-11 NOTE — Progress Notes (Signed)
2 wks f/u for foot infection

## 2018-09-12 ENCOUNTER — Encounter: Payer: Self-pay | Admitting: Family Medicine

## 2018-09-12 DIAGNOSIS — L089 Local infection of the skin and subcutaneous tissue, unspecified: Secondary | ICD-10-CM | POA: Insufficient documentation

## 2018-09-12 DIAGNOSIS — N186 End stage renal disease: Secondary | ICD-10-CM | POA: Diagnosis not present

## 2018-09-12 DIAGNOSIS — N2581 Secondary hyperparathyroidism of renal origin: Secondary | ICD-10-CM | POA: Diagnosis not present

## 2018-09-12 DIAGNOSIS — D631 Anemia in chronic kidney disease: Secondary | ICD-10-CM | POA: Diagnosis not present

## 2018-09-12 DIAGNOSIS — D509 Iron deficiency anemia, unspecified: Secondary | ICD-10-CM | POA: Diagnosis not present

## 2018-09-13 DIAGNOSIS — Z992 Dependence on renal dialysis: Secondary | ICD-10-CM | POA: Diagnosis not present

## 2018-09-13 DIAGNOSIS — I739 Peripheral vascular disease, unspecified: Secondary | ICD-10-CM | POA: Diagnosis not present

## 2018-09-13 DIAGNOSIS — Z95828 Presence of other vascular implants and grafts: Secondary | ICD-10-CM | POA: Diagnosis not present

## 2018-09-13 DIAGNOSIS — I2510 Atherosclerotic heart disease of native coronary artery without angina pectoris: Secondary | ICD-10-CM | POA: Diagnosis not present

## 2018-09-13 DIAGNOSIS — N186 End stage renal disease: Secondary | ICD-10-CM | POA: Diagnosis not present

## 2018-09-13 DIAGNOSIS — I5042 Chronic combined systolic (congestive) and diastolic (congestive) heart failure: Secondary | ICD-10-CM | POA: Diagnosis not present

## 2018-09-13 DIAGNOSIS — Z48812 Encounter for surgical aftercare following surgery on the circulatory system: Secondary | ICD-10-CM | POA: Diagnosis not present

## 2018-09-13 DIAGNOSIS — I214 Non-ST elevation (NSTEMI) myocardial infarction: Secondary | ICD-10-CM | POA: Diagnosis not present

## 2018-09-13 DIAGNOSIS — I132 Hypertensive heart and chronic kidney disease with heart failure and with stage 5 chronic kidney disease, or end stage renal disease: Secondary | ICD-10-CM | POA: Diagnosis not present

## 2018-09-15 ENCOUNTER — Telehealth: Payer: Self-pay | Admitting: Cardiovascular Disease

## 2018-09-15 DIAGNOSIS — N2581 Secondary hyperparathyroidism of renal origin: Secondary | ICD-10-CM | POA: Diagnosis not present

## 2018-09-15 DIAGNOSIS — N186 End stage renal disease: Secondary | ICD-10-CM | POA: Diagnosis not present

## 2018-09-15 DIAGNOSIS — D631 Anemia in chronic kidney disease: Secondary | ICD-10-CM | POA: Diagnosis not present

## 2018-09-15 DIAGNOSIS — D509 Iron deficiency anemia, unspecified: Secondary | ICD-10-CM | POA: Diagnosis not present

## 2018-09-15 NOTE — Telephone Encounter (Signed)
Pt c/o Shortness Of Breath: STAT if SOB developed within the last 24 hours or pt is noticeably SOB on the phone  1. Are you currently SOB (can you hear that pt is SOB on the phone)? no  2. How long have you been experiencing SOB? Since surgery to remove two toes 08/19/18  3. Are you SOB when sitting or when up moving around? Up and moving around  4. Are you currently experiencing any other symptoms? Hematoma in the groin area   Patient gets worried because the SOB wakes him up in the middle of the night and he often stays awake afterwards to catch his breath.   Patient is scheduled for Dialysis today after lunch. He wants to know if this is severe enough to send him to the hospital

## 2018-09-15 NOTE — Telephone Encounter (Signed)
I spoke with pt. He reports more frequent episodes of shortness of breath recently. Occurs with exertion and at rest. Worse since surgery in June. Can be sitting on side of bed or in chair and will have shortness of breath. No chest pain. He is going into dialysis now.  Dialysis doesn't help shortness of breath.  I told pt I would send message to Dr. Angelena Form for his recommendations. I advised pt if shortness of breath became severe he should go to ED for evaluation

## 2018-09-16 ENCOUNTER — Telehealth: Payer: Self-pay | Admitting: Family Medicine

## 2018-09-16 DIAGNOSIS — I739 Peripheral vascular disease, unspecified: Secondary | ICD-10-CM | POA: Diagnosis not present

## 2018-09-16 DIAGNOSIS — I2510 Atherosclerotic heart disease of native coronary artery without angina pectoris: Secondary | ICD-10-CM | POA: Diagnosis not present

## 2018-09-16 DIAGNOSIS — Z95828 Presence of other vascular implants and grafts: Secondary | ICD-10-CM | POA: Diagnosis not present

## 2018-09-16 DIAGNOSIS — I214 Non-ST elevation (NSTEMI) myocardial infarction: Secondary | ICD-10-CM | POA: Diagnosis not present

## 2018-09-16 DIAGNOSIS — Z48812 Encounter for surgical aftercare following surgery on the circulatory system: Secondary | ICD-10-CM | POA: Diagnosis not present

## 2018-09-16 DIAGNOSIS — I132 Hypertensive heart and chronic kidney disease with heart failure and with stage 5 chronic kidney disease, or end stage renal disease: Secondary | ICD-10-CM | POA: Diagnosis not present

## 2018-09-16 NOTE — Telephone Encounter (Signed)
RN [place] called to inform PCP that pt continues to report SOB for the past couple weeks, she states it has been more pronounced recently.Please follow up  [phone] p

## 2018-09-16 NOTE — Telephone Encounter (Signed)
I spoke to [name] today. I reviewed his echo and cath from May 2020. He likely has severe low flow/low gradient AS. He is now more dyspneic. I think we need to push toward TAVR. He is ESRD on HD. He is in agreement to proceed. I don't think I need to see him back in the office right now.   Lauren, Can you help arrange CT scans, PFTS and carotids and I can discuss him at our next TAVR meeting?   Thanks, chris

## 2018-09-17 ENCOUNTER — Other Ambulatory Visit: Payer: Self-pay

## 2018-09-17 DIAGNOSIS — R0602 Shortness of breath: Secondary | ICD-10-CM

## 2018-09-17 DIAGNOSIS — I350 Nonrheumatic aortic (valve) stenosis: Secondary | ICD-10-CM

## 2018-09-17 DIAGNOSIS — N2581 Secondary hyperparathyroidism of renal origin: Secondary | ICD-10-CM | POA: Diagnosis not present

## 2018-09-17 DIAGNOSIS — R0609 Other forms of dyspnea: Secondary | ICD-10-CM

## 2018-09-17 DIAGNOSIS — D631 Anemia in chronic kidney disease: Secondary | ICD-10-CM | POA: Diagnosis not present

## 2018-09-17 DIAGNOSIS — D509 Iron deficiency anemia, unspecified: Secondary | ICD-10-CM | POA: Diagnosis not present

## 2018-09-17 DIAGNOSIS — N186 End stage renal disease: Secondary | ICD-10-CM | POA: Diagnosis not present

## 2018-09-17 NOTE — Telephone Encounter (Signed)
I spoke with the pt and he is currently at dialysis (M, W, F 11:30-4:00).  I reviewed TAVR work up with the pt and at this time he does not need PFT or carotid duplex.  I will arrange TAVR CT scans, PT Eval and Cardiac Surgeon evaluation.  The pt does have a left groin hematoma and will be seeing [facility] tomorrow with the potential to have other appointments arranged.  I advised the pt that I will hold off on making TAVR apts at this time and will await [facility] evaluation. The pt agreed with plan.

## 2018-09-18 ENCOUNTER — Other Ambulatory Visit: Payer: Self-pay

## 2018-09-18 ENCOUNTER — Encounter: Payer: Self-pay | Admitting: Family

## 2018-09-18 ENCOUNTER — Ambulatory Visit: Payer: Medicare Other | Admitting: Family

## 2018-09-18 VITALS — BP 115/66 | HR 74 | Temp 97.8°F | Resp 14 | Ht 70.0 in | Wt 186.7 lb

## 2018-09-18 DIAGNOSIS — Z95828 Presence of other vascular implants and grafts: Secondary | ICD-10-CM | POA: Diagnosis not present

## 2018-09-18 DIAGNOSIS — I214 Non-ST elevation (NSTEMI) myocardial infarction: Secondary | ICD-10-CM | POA: Diagnosis not present

## 2018-09-18 DIAGNOSIS — I739 Peripheral vascular disease, unspecified: Secondary | ICD-10-CM | POA: Diagnosis not present

## 2018-09-18 DIAGNOSIS — L7634 Postprocedural seroma of skin and subcutaneous tissue following other procedure: Secondary | ICD-10-CM

## 2018-09-18 DIAGNOSIS — I2510 Atherosclerotic heart disease of native coronary artery without angina pectoris: Secondary | ICD-10-CM | POA: Diagnosis not present

## 2018-09-18 DIAGNOSIS — I779 Disorder of arteries and arterioles, unspecified: Secondary | ICD-10-CM

## 2018-09-18 DIAGNOSIS — Z48812 Encounter for surgical aftercare following surgery on the circulatory system: Secondary | ICD-10-CM | POA: Diagnosis not present

## 2018-09-18 DIAGNOSIS — I132 Hypertensive heart and chronic kidney disease with heart failure and with stage 5 chronic kidney disease, or end stage renal disease: Secondary | ICD-10-CM | POA: Diagnosis not present

## 2018-09-18 NOTE — Progress Notes (Signed)
[facility] [place]   CC: sutures removal left toes amputation site, wounds and left groin seroma evaluation   History of Present Illness Jose Garrett is a 68 y.o. male who is s/p left femoral to below knee popliteal bypass graft with 59mm ringed PTFE on 07/22/2018 and amputation of left 2nd and 3rd toes on 08/19/2018 also by Dr. Donzetta Matters.    He returned to clinic on 08-29-18 because the home health nurse called and stated patient had a firm hematoma at his left groin incision.   Pt was seen by M. Eveland PA-C at that time. His assessment and plan was the following: Patent bypass with well-perfused left foot on exam -Left groin incision well-healed however there was a sizable seroma -Patient was also only a week out from toe amputations so sutures will remain in place for another 1 to 2 weeks -No sign of infection in left groin; unless seroma begins to drain we will err on the side of conservative management of groin seroma given that bypasses with PTFE -Patient is aware of risk of infection with PTFE bypass - Pt was to return to office in 1-2 weeks for suture removal and wound check - toe discoloration of R foot was stable;  Patient will need RLE bypass when recovered from recent surgery  He dialyzes M-W-F via right forearm AVF. He is left hand dominant.   He states that he needs a heart valve procedure, but waiting on resolution of left groin seroma and incisions to heal from the last PAD surgery. Dr. Suezanne Cheshire is his cardiologist.  He states that his shortness of breath may be due to his heart valve issue.   He states that the left groin seroma has remained the same size, is somewhat bothersome to him, not painful.   He denies fever or chills. He states baseline dyspnea, this is stable.   Diabetic: No Tobacco use: former smoker, quit in 2013, smoked 2 ppd x 40 years  Pt meds include: Statin :Yes Betablocker: No ASA: Yes Other anticoagulants/antiplatelets: Plavix  Past Medical History:  Diagnosis Date  . Amyloidosis ([place])   . Anemia Dx 2016  . Anxiety    "especially with procedure"- 07/21/2018  . Aortic stenosis   . CAD (coronary artery disease)    a. cardiac cath on 11/25/17 with occlusion of the RCA which could have been his event but the RCA could not be engaged. The RCA filled distally from left to right collaterals. There were no severe stenoses in the left coronary system. Medical therapy recommended, started on plavix.  . Cancer ([place])   . Chronic combined systolic and diastolic CHF (congestive heart failure) ([place])    a. EF dropped to 35-40% with grade 2 DD by echo 11/2017.  [name] Diarrhea   . ESRD (end stage renal disease) ([place])    ESRD- HEMO MWF- [facility]  . Family history of adverse reaction to anesthesia    "my daughter can't take certain anesthesia agents" (09/08/2014)  . GERD (gastroesophageal reflux disease)   . Heart murmur   . History of blood transfusion 09/08/2014   "got hematoma after renal biopsy & HgB dropped"  . Hyperlipidemia Dx 2012  . Hypertension Dx 2012  . Myocardial infarction ([place]) 11/2017  . Neuropathy   . Peripheral vascular disease ([place])   . Pneumonia   . Restless legs     Social History Social History   Tobacco Use  . Smoking status: Former Smoker    Packs/day:  2.00    Years: 40.00    Pack years: 80.00    Types: Cigarettes    Quit date: 03/19/2011    Years since quitting: 7.5  . Smokeless tobacco: Never Used  Substance Use Topics  . Alcohol use: No    Alcohol/week: 0.0 standard drinks  . Drug use: No    Comment: 09/08/2014 "I have used marijuana till the 1990's". Notes that he quit 15 yrs ago    Family History Family History  Problem Relation Age of Onset  . Hypertension Mother   . Diabetes Mother   . Heart disease Mother   . Skin cancer Mother   . Hypertension Father   . Diabetes Father   . Diabetes Sister     Past Surgical History:  Procedure Laterality Date  . ABDOMINAL AORTOGRAM  W/LOWER EXTREMITY Bilateral 07/16/2018   Procedure: ABDOMINAL AORTOGRAM W/LOWER EXTREMITY;  Surgeon: Wellington Hampshire, MD;  Location: [facility];  Service: Cardiovascular;  Laterality: Bilateral;  . AMPUTATION Left 08/19/2018   Procedure: AMPUTATION LEFT TOES SECOND AND THIRD;  Surgeon: Waynetta Sandy, MD;  Location: [facility];  Service: Vascular;  Laterality: Left;  . AV FISTULA PLACEMENT Right 12/27/2015   Procedure: Right Arm ARTERIOVENOUS (AV) FISTULA CREATION;  Surgeon: Angelia Mould, MD;  Location: [facility];  Service: Vascular;  Laterality: Right;  . COLONOSCOPY    . FEMORAL-POPLITEAL BYPASS GRAFT Left 07/22/2018   Procedure: left FEMORAL TO BELOW KNEE POPLITEAL ARTERY BYPASS GREAFT using 16mm removable ring propaten gore graft;  Surgeon: Waynetta Sandy, MD;  Location: [facility];  Service: Vascular;  Laterality: Left;  . IR GENERIC HISTORICAL  01/27/2016   IR FLUORO GUIDE CV LINE RIGHT 01/27/2016 Arne Cleveland, MD [facility]  . IR GENERIC HISTORICAL  01/27/2016   IR US GUIDE VASC ACCESS RIGHT 01/27/2016 Arne Cleveland, MD [facility]  . LAPAROSCOPIC CHOLECYSTECTOMY  03/2011  . LEFT HEART CATH AND CORONARY ANGIOGRAPHY N/A 11/25/2017   Procedure: LEFT HEART CATH AND CORONARY ANGIOGRAPHY;  Surgeon: Martinique, Peter M, MD;  Location: [facility];  Service: Cardiovascular;  Laterality: N/A;  . LEFT HEART CATH AND CORONARY ANGIOGRAPHY N/A 07/24/2018   Procedure: LEFT HEART CATH AND CORONARY ANGIOGRAPHY;  Surgeon: Sherren Mocha, MD;  Location: [facility];  Service: Cardiovascular;  Laterality: N/A;  . RENAL BIOPSY, PERCUTANEOUS Right 09/08/2014  . RIGHT/LEFT HEART CATH AND CORONARY ANGIOGRAPHY N/A 03/25/2018   Procedure: RIGHT/LEFT HEART CATH AND CORONARY ANGIOGRAPHY;  Surgeon: Martinique, Peter M, MD;  Location: [facility];  Service: Cardiovascular;  Laterality: N/A;  . TOE AMPUTATION Left 08/19/2018   2nd & 3rd toes  . TONSILLECTOMY  ~ 1960  .  ULTRASOUND GUIDANCE FOR VASCULAR ACCESS  03/25/2018   Procedure: Ultrasound Guidance For Vascular Access;  Surgeon: Martinique, Peter M, MD;  Location: [facility];  Service: Cardiovascular;;    No Known Allergies  Current Outpatient Medications  Medication Sig Dispense Refill  . acetaminophen (TYLENOL) 500 MG tablet Take 500-1,000 mg by mouth daily as needed (pain).     [name] aspirin EC 81 MG tablet Take 1 tablet (81 mg total) by mouth daily. 30 tablet 11  . atorvastatin (LIPITOR) 80 MG tablet Take 1 tablet (80 mg total) by mouth daily with supper. 30 tablet 0  . B Complex-C-Folic Acid (RENA-VITE RX) 1 MG TABS Take 1 tablet by mouth daily.    . cephALEXin (KEFLEX) 250 MG capsule Take 250 mg by mouth 2 (  two) times daily. Started 08/12/2018-  Med to be finished 08/22/2018    . cetirizine (ZYRTEC) 10 MG tablet Take 10 mg by mouth daily.    . clopidogrel (PLAVIX) 75 MG tablet Take 1 tablet (75 mg total) by mouth daily with supper. 30 tablet 0  . Darbepoetin Alfa (ARANESP) 200 MCG/0.4ML SOSY injection Inject 0.4 mLs (200 mcg total) into the vein every Wednesday with hemodialysis. 1.68 mL   . docusate sodium (COLACE) 100 MG capsule Take 1 capsule (100 mg total) by mouth 3 (three) times daily. 10 capsule 0  . gabapentin (NEURONTIN) 300 MG capsule Take 1 capsule (300 mg total) by mouth every morning. (Patient taking differently: Take 300-600 mg by mouth See admin instructions. 300 mg in the morning, and 600 mg twice daily, lunch and dinner) 30 capsule 0  . LORazepam (ATIVAN) 1 MG tablet Take 1 mg by mouth 2 (two) times daily as needed for anxiety.    . midodrine (PROAMATINE) 10 MG tablet Take 1 tablet (10 mg total) by mouth every Monday, Wednesday, and Friday with hemodialysis. 30 tablet 0  . multivitamin (RENA-VIT) TABS tablet Take 1 tablet by mouth at bedtime. 30 tablet 0  . mupirocin ointment (BACTROBAN) 2 % Apply 1 application topically daily.    [name] oxyCODONE (OXY IR/ROXICODONE) 5 MG immediate release  tablet Take 1-2 tablets (5-10 mg total) by mouth every 4 (four) hours as needed for moderate pain. 30 tablet 0  . pantoprazole (PROTONIX) 40 MG tablet Take 1 tablet (40 mg total) by mouth daily. 30 tablet 0  . sucroferric oxyhydroxide (VELPHORO) 500 MG chewable tablet Chew 2 tablets (1,000 mg total) by mouth 3 (three) times daily with meals. 90 tablet 0  . vitamin B-12 (CYANOCOBALAMIN) 1000 MCG tablet Take 1,000 mcg by mouth daily.     No current facility-administered medications for this visit.     ROS: See HPI for pertinent positives and negatives.   Physical Examination  Vitals:   09/18/18 1302  BP: 115/66  Pulse: 74  Resp: 14  Temp: 97.8 F (36.6 C)  TempSrc: Temporal  SpO2: 98%  Weight: 186 lb 11.2 oz (84.7 kg)  Height: 5\' 10"  (1.778 m)   Body mass index is 26.79 kg/m.  General: A&O x 3, WDWN, male in NAD. Gait: limp, using cane HEENT: No gross abnormalities.  Pulmonary: Respirations are slightly labored at rest Cardiac: Regularly irregular rhythm, controlled rate     Radial pulses are 2+ palpable bilaterally   Adominal aortic pulse is not palpable                         VASCULAR EXAM: Extremities without ischemic changes, without Gangrene; with healing left 2nd and 3rd toes amputation site, see photo below.    Left groin seroma measures 12 cm x 17 cm, left femoral pulses     Left lower leg incision well healed. 2+ pitting and non pitting edema in left foot and lower leg.  LE Pulses Right Left       FEMORAL  2+ palpable  not palpable (large seroma)        POPLITEAL  not palpable   not palpable       POSTERIOR TIBIAL  not palpable, +brisk Doppler signal   not palpable, +brisk Doppler signal        DORSALIS PEDIS      ANTERIOR TIBIAL not palpable, no Doppler signal  not palpable, no Doppler signal    Abdomen: soft, NT, no palpable masses. See  Extremities Skin: no rashes, no cellulitis, no ulcers noted. Musculoskeletal: no muscle wasting or atrophy. See Extremities  Neurologic: A&O X 3; appropriate affect, Sensation is normal; MOTOR FUNCTION:  moving all extremities equally, motor strength 5/5 throughout. Speech is fluent/normal. CN 2-12 intact. Psychiatric: Thought content is normal, mood appropriate for clinical situation.     ASSESSMENT: AARIN BLUETT is a 68 y.o. male who is s/p left femoral to below knee popliteal bypass graft with 51mm ringed PTFE on 07/22/2018 and amputation of left 2nd and 3rd toes on 08/19/2018 also by Dr. Donzetta Matters.    Dr. Oneida Alar spoke with and examined pt. Pt states he can tolerate the left groin large seroma, instead of draining it which carries the risk of infection of the PTFE bypass graft.  He currently has no fever or chills, the left groin seroma has no erythema, no drainage.  All sutures removed from left 2nd and 3rd toes amputation site today, this is healing well, see above photo. Will schedule him to return in 6 weeks to discuss with Dr. Donzetta Matters whether or not to drain left groin seroma, and discussion of right LE bypass graft.  Cardiology will need to access one of his groins for a procedure to address one of his heart valves.   We discussed elevation of his feet above his heart, feet above slightly flexed knees, to reduce swelling in left foot and lower leg.     PLAN:  Will schedule him to return in 6 weeks to discuss with Dr. Donzetta Matters whether or not to drain left groin seroma, and consideration of right LE bypass graft.   I discussed in depth with the patient the nature of atherosclerosis, and emphasized the importance of maximal medical management including strict control of blood pressure, blood glucose, and lipid levels, obtaining regular exercise, and continued cessation of smoking.  The patient is aware that without maximal medical management the underlying atherosclerotic disease process will  progress, limiting the benefit of any interventions.  The patient was given information about PAD including signs, symptoms, treatment, what symptoms should prompt the patient to seek immediate medical care, and risk reduction measures to take.  Clemon Chambers, RN, MSN, FNP-C [facility] Phone: [phone]  Clinic MD: Laqueta Due  09/18/18 1:06 PM

## 2018-09-18 NOTE — Patient Instructions (Signed)
Peripheral Vascular Disease  Peripheral vascular disease (PVD) is a disease of the blood vessels that are not part of your heart and brain. A simple term for PVD is poor circulation. In most cases, PVD narrows the blood vessels that carry blood from your heart to the rest of your body. This can reduce the supply of blood to your arms, legs, and internal organs, like your stomach or kidneys. However, PVD most often affects a person's lower legs and feet. Without treatment, PVD tends to get worse. PVD can also lead to acute ischemic limb. This is when an arm or leg suddenly cannot get enough blood. This is a medical emergency. Follow these instructions at home: Lifestyle  Do not use any products that contain nicotine or tobacco, such as cigarettes and e-cigarettes. If you need help quitting, ask your doctor.  Lose weight if you are overweight. Or, stay at a healthy weight as told by your doctor.  Eat a diet that is low in fat and cholesterol. If you need help, ask your doctor.  Exercise regularly. Ask your doctor for activities that are right for you. General instructions  Take over-the-counter and prescription medicines only as told by your doctor.  Take good care of your feet: ? Wear comfortable shoes that fit well. ? Check your feet often for any cuts or sores.  Keep all follow-up visits as told by your doctor This is important. Contact a doctor if:  You have cramps in your legs when you walk.  You have leg pain when you are at rest.  You have coldness in a leg or foot.  Your skin changes.  You are unable to get or have an erection (erectile dysfunction).  You have cuts or sores on your feet that do not heal. Get help right away if:  Your arm or leg turns cold, numb, and blue.  Your arms or legs become red, warm, swollen, painful, or numb.  You have chest pain.  You have trouble breathing.  You suddenly have weakness in your face, arm, or leg.  You become very  confused or you cannot speak.  You suddenly have a very bad headache.  You suddenly cannot see. Summary  Peripheral vascular disease (PVD) is a disease of the blood vessels.  A simple term for PVD is poor circulation. Without treatment, PVD tends to get worse.  Treatment may include exercise, low fat and low cholesterol diet, and quitting smoking. This information is not intended to replace advice given to you by your health care provider. Make sure you discuss any questions you have with your health care provider. Document Released: 05/23/2009 Document Revised: 02/08/2017 Document Reviewed: 04/05/2016 Elsevier Patient Education  2020 Elsevier Inc.  

## 2018-09-19 DIAGNOSIS — D631 Anemia in chronic kidney disease: Secondary | ICD-10-CM | POA: Diagnosis not present

## 2018-09-19 DIAGNOSIS — D509 Iron deficiency anemia, unspecified: Secondary | ICD-10-CM | POA: Diagnosis not present

## 2018-09-19 DIAGNOSIS — N2581 Secondary hyperparathyroidism of renal origin: Secondary | ICD-10-CM | POA: Diagnosis not present

## 2018-09-19 DIAGNOSIS — N186 End stage renal disease: Secondary | ICD-10-CM | POA: Diagnosis not present

## 2018-09-22 ENCOUNTER — Other Ambulatory Visit: Payer: Self-pay | Admitting: Physician Assistant

## 2018-09-22 ENCOUNTER — Telehealth: Payer: Self-pay | Admitting: *Deleted

## 2018-09-22 DIAGNOSIS — I350 Nonrheumatic aortic (valve) stenosis: Secondary | ICD-10-CM

## 2018-09-22 DIAGNOSIS — D509 Iron deficiency anemia, unspecified: Secondary | ICD-10-CM | POA: Diagnosis not present

## 2018-09-22 DIAGNOSIS — N2581 Secondary hyperparathyroidism of renal origin: Secondary | ICD-10-CM | POA: Diagnosis not present

## 2018-09-22 DIAGNOSIS — N186 End stage renal disease: Secondary | ICD-10-CM | POA: Diagnosis not present

## 2018-09-22 DIAGNOSIS — D631 Anemia in chronic kidney disease: Secondary | ICD-10-CM | POA: Diagnosis not present

## 2018-09-22 MED ORDER — METOPROLOL TARTRATE 50 MG PO TABS: 50.0000 mg | ORAL_TABLET | ORAL | 0 refills | Status: DC

## 2018-09-22 NOTE — Telephone Encounter (Signed)
The patient stated that he would prefer to have a virtual visit tomorrow. He does have a smartphone. He stated that it is harder for him to get out now after his procedures.     Virtual Visit Pre-Appointment Phone Call  "(Name), I am calling you today to discuss your upcoming appointment. We are currently trying to limit exposure to the virus that causes COVID-19 by seeing patients at home rather than in the office."  1. "What is the BEST phone number to call the day of the visit?" - include this in appointment notes  2. "Do you have or have access to (through a family member/friend) a smartphone with video capability that we can use for your visit?" a. If yes - list this number in appt notes as "cell" (if different from BEST phone #) and list the appointment type as a VIDEO visit in appointment notes b. If no - list the appointment type as a PHONE visit in appointment notes  Confirm consent - "In the setting of the current Covid19 crisis, you are scheduled for a (phone or video) visit with your provider on (date) at (time).  Just as we do with many in-office visits, in order for you to participate in this visit, we must obtain consent.  If you'd like, I can send this to your mychart (if signed up) or email for you to review.  Otherwise, I can obtain your verbal consent now.  All virtual visits are billed to your insurance company just like a normal visit would be.  By agreeing to a virtual visit, we'd like you to understand that the technology does not allow for your provider to perform an examination, and thus may limit your provider's ability to fully assess your condition. If your provider identifies any concerns that need to be evaluated in person, we will make arrangements to do so.  Finally, though the technology is pretty good, we cannot assure that it will always work on either your or our end, and in the setting of a video visit, we may have to convert it to a phone-only visit.  In either  situation, we cannot ensure that we have a secure connection.  Are you willing to proceed?" YES  3. Advise patient to be prepared - "Two hours prior to your appointment, go ahead and check your blood pressure, pulse, oxygen saturation, and your weight (if you have the equipment to check those) and write them all down. When your visit starts, your provider will ask you for this information. If you have an Apple Watch or Kardia device, please plan to have heart rate information ready on the day of your appointment. Please have a pen and paper handy nearby the day of the visit as well."  4. Give patient instructions for MyChart download to smartphone OR [email or web address] as below if video visit (depending on what platform provider is using)  5. Inform patient they will receive a phone call 15 minutes prior to their appointment time (may be from unknown caller ID) so they should be prepared to answer    TELEPHONE CALL NOTE  Jose Garrett has been deemed a candidate for a follow-up tele-health visit to limit community exposure during the Covid-19 pandemic. I spoke with the patient via phone to ensure availability of phone/video source, confirm preferred email & phone number, and discuss instructions and expectations.  I reminded Jose Garrett to be prepared with any vital sign and/or heart rhythm information that could potentially be obtained  via home monitoring, at the time of his visit. I reminded Jose Garrett to expect a phone call prior to his visit.  Ricci Barker, RN 09/22/2018 8:57 AM   INSTRUCTIONS FOR DOWNLOADING THE MYCHART APP TO SMARTPHONE  - The patient must first make sure to have activated MyChart and know their login information - If Apple, go to [employer] and type in MyChart in the search bar and download the app. If Android, ask patient to go to [employer] and type in [place] in the search bar and download the app. The app is free but as with any other app downloads,  their phone may require them to verify saved payment information or Apple/Android password.  - The patient will need to then log into the app with their MyChart username and password, and select [facility] as their healthcare provider to link the account. When it is time for your visit, go to the MyChart app, find appointments, and click Begin Video Visit. Be sure to Select Allow for your device to access the Microphone and Camera for your visit. You will then be connected, and your provider will be with you shortly.  **If they have any issues connecting, or need assistance please contact MyChart service desk [phone])**  **If using a computer, in order to ensure the best quality for their visit they will need to use either of the following Internet Browsers: [employer], or Google Chrome**  IF USING DOXIMITY or DOXY.ME - The patient will receive a link just prior to their visit by text.     FULL LENGTH CONSENT FOR TELE-HEALTH VISIT   I hereby voluntarily request, consent and authorize [facility] and its employed or contracted physicians, physician assistants, nurse practitioners or other licensed health care professionals (the Practitioner), to provide me with telemedicine health care services (the "Services") as deemed necessary by the treating Practitioner. I acknowledge and consent to receive the Services by the Practitioner via telemedicine. I understand that the telemedicine visit will involve communicating with the Practitioner through live audiovisual communication technology and the disclosure of certain medical information by electronic transmission. I acknowledge that I have been given the opportunity to request an in-person assessment or other available alternative prior to the telemedicine visit and am voluntarily participating in the telemedicine visit.  I understand that I have the right to withhold or withdraw my consent to the use of telemedicine in the  course of my care at any time, without affecting my right to future care or treatment, and that the Practitioner or I may terminate the telemedicine visit at any time. I understand that I have the right to inspect all information obtained and/or recorded in the course of the telemedicine visit and may receive copies of available information for a reasonable fee.  I understand that some of the potential risks of receiving the Services via telemedicine include:  [name] Delay or interruption in medical evaluation due to technological equipment failure or disruption; . Information transmitted may not be sufficient (e.g. poor resolution of images) to allow for appropriate medical decision making by the Practitioner; and/or  . In rare instances, security protocols could fail, causing a breach of personal health information.  Furthermore, I acknowledge that it is my responsibility to provide information about my medical history, conditions and care that is complete and accurate to the best of my ability. I acknowledge that Practitioner's advice, recommendations, and/or decision may be based on factors not within their control, such  as incomplete or inaccurate data provided by me or distortions of diagnostic images or specimens that may result from electronic transmissions. I understand that the practice of medicine is not an exact science and that Practitioner makes no warranties or guarantees regarding treatment outcomes. I acknowledge that I will receive a copy of this consent concurrently upon execution via email to the email address I last provided but may also request a printed copy by calling the office of [facility].    I understand that my insurance will be billed for this visit.   I have read or had this consent read to me. . I understand the contents of this consent, which adequately explains the benefits and risks of the Services being provided via telemedicine.  . I have been provided ample  opportunity to ask questions regarding this consent and the Services and have had my questions answered to my satisfaction. . I give my informed consent for the services to be provided through the use of telemedicine in my medical care  By participating in this telemedicine visit I agree to the above.

## 2018-09-22 NOTE — Telephone Encounter (Signed)
Left a message for the patient to call back to verify his appointment for tomorrow with Dr. Fletcher Anon and to do a prescreening.

## 2018-09-23 ENCOUNTER — Telehealth (INDEPENDENT_AMBULATORY_CARE_PROVIDER_SITE_OTHER): Payer: Medicare Other | Admitting: Cardiovascular Disease

## 2018-09-23 ENCOUNTER — Encounter: Payer: Self-pay | Admitting: Cardiovascular Disease

## 2018-09-23 VITALS — Ht 70.0 in | Wt 188.0 lb

## 2018-09-23 DIAGNOSIS — I739 Peripheral vascular disease, unspecified: Secondary | ICD-10-CM

## 2018-09-23 DIAGNOSIS — I214 Non-ST elevation (NSTEMI) myocardial infarction: Secondary | ICD-10-CM | POA: Diagnosis not present

## 2018-09-23 DIAGNOSIS — I2510 Atherosclerotic heart disease of native coronary artery without angina pectoris: Secondary | ICD-10-CM

## 2018-09-23 DIAGNOSIS — I359 Nonrheumatic aortic valve disorder, unspecified: Secondary | ICD-10-CM

## 2018-09-23 DIAGNOSIS — I132 Hypertensive heart and chronic kidney disease with heart failure and with stage 5 chronic kidney disease, or end stage renal disease: Secondary | ICD-10-CM | POA: Diagnosis not present

## 2018-09-23 DIAGNOSIS — Z48812 Encounter for surgical aftercare following surgery on the circulatory system: Secondary | ICD-10-CM | POA: Diagnosis not present

## 2018-09-23 DIAGNOSIS — Z95828 Presence of other vascular implants and grafts: Secondary | ICD-10-CM | POA: Diagnosis not present

## 2018-09-23 NOTE — Progress Notes (Signed)
Virtual Visit via Video Note   This visit type was conducted due to national recommendations for restrictions regarding the COVID-19 Pandemic (e.g. social distancing) in an effort to limit this patient's exposure and mitigate transmission in our community.  Due to his co-morbid illnesses, this patient is at least at moderate risk for complications without adequate follow up.  This format is felt to be most appropriate for this patient at this time.  All issues noted in this document were discussed and addressed.  A limited physical exam was performed with this format.  Please refer to the patient's chart for his consent to telehealth for [facility].   Evaluation Performed:  Follow-up visit  Date:  09/23/2018   ID:  Jose [facility], DOB 08/11/1950, MRN [id number]  Patient Location: Home Provider Location: Home  PCP:  [name], MD  Cardiologist:  Lauree Chandler, MD  Electrophysiologist:  None   Chief Complaint:  Dark discoloration of toes  History of Present Illness:    Jose Garrett is a 68 y.o. male who is here today for follow-up visit regarding peripheral arterial disease and critical limb ischemia.  I tried to connect with him via video but could not do so on the switch to a phone.   He has extensive medical problems including ESRD on HD, AL amyloid (kidney involvement), aortic stenosis, HTN, HLD, GERD, anemia, chronic combined CHF and CAD . He was admitted to [facility] September 2019 with acute respiratory failure in setting of hypertensive urgency. His troponin was elevated and his EKG showed diffuse ST depression. Cardiac cath 11/25/17 with occlusion of the RCA which was felt to be the culprit but this vessel could not be engaged. The distal RCA filled from collaterals. There was moderate disease in the ostial Circumflex and mild disease in the LAD. Medical therapy was recommended. LVEF=35-40%. There was moderate aortic stenosis. Echo on 12/19/17 with LVEF=65-70%. Moderate AS  (mean gradient 31 mmHg).   His amyloid is followed by oncology whose notes indicate prior tx with CyBorD, Velcade, Ixazomib andDexamethasone. Bone marrow with no overt evidence of multiple myeloma. PET/CT scan showed no evidence of bony lesions or lymphadenopathy. He had neuropathy with some of his treatment measures so is no longer on chemotherapy.Last oncology note indicates serologic remission.  He was seen few months ago for gangrenous changes to the left toes.He underwent noninvasive vascular studies which showed noncompressible vessels with no flow detected in the left toes and very dampened flow in the right toes.  Duplex showed bilateral SFA occlusion and in addition there was left popliteal artery occlusion and TP trunk occlusion.  He underwent angiography which confirmed this.  He underwent left femoral to below the knee popliteal bypass with synthetic graft.  He had postoperative myocardial infarction.  Cardiac catheterization showed no change in coronary anatomy.  It was felt that his aortic stenosis might have contributed.  He underwent toe amputations and these have healed completely. He developed a left groin hematoma which is still followed by vascular. He is also currently getting evaluation for TAVR.  The patient does not have symptoms concerning for COVID-19 infection (fever, chills, cough, or new shortness of breath).    Past Medical History:  Diagnosis Date  . Amyloidosis ([place])   . Anemia Dx 2016  . Anxiety    "especially with procedure"- 07/21/2018  . Aortic stenosis   . CAD (coronary artery disease)    a. cardiac cath on 11/25/17 with occlusion of the RCA which could have  been his event but the RCA could not be engaged. The RCA filled distally from left to right collaterals. There were no severe stenoses in the left coronary system. Medical therapy recommended, started on plavix.  . Cancer ([place])   . Chronic combined systolic and diastolic CHF (congestive heart failure)  ([place])    a. EF dropped to 35-40% with grade 2 DD by echo 11/2017.  [name] Diarrhea   . ESRD (end stage renal disease) ([place])    ESRD- HEMO MWF- [employer]  . Family history of adverse reaction to anesthesia    "my daughter can't take certain anesthesia agents" (09/08/2014)  . GERD (gastroesophageal reflux disease)   . Heart murmur   . History of blood transfusion 09/08/2014   "got hematoma after renal biopsy & HgB dropped"  . Hyperlipidemia Dx 2012  . Hypertension Dx 2012  . Myocardial infarction ([place]) 11/2017  . Neuropathy   . Peripheral vascular disease ([place])   . Pneumonia   . Restless legs    Past Surgical History:  Procedure Laterality Date  . ABDOMINAL AORTOGRAM W/LOWER EXTREMITY Bilateral 07/16/2018   Procedure: ABDOMINAL AORTOGRAM W/LOWER EXTREMITY;  Surgeon: Wellington Hampshire, MD;  Location: [place] [facility];  Service: Cardiovascular;  Laterality: Bilateral;  . AMPUTATION Left 08/19/2018   Procedure: AMPUTATION LEFT TOES SECOND AND THIRD;  Surgeon: Waynetta Sandy, MD;  Location: [place];  Service: Vascular;  Laterality: Left;  . AV FISTULA PLACEMENT Right 12/27/2015   Procedure: Right Arm ARTERIOVENOUS (AV) FISTULA CREATION;  Surgeon: Angelia Mould, MD;  Location: [place];  Service: Vascular;  Laterality: Right;  . COLONOSCOPY    . FEMORAL-POPLITEAL BYPASS GRAFT Left 07/22/2018   Procedure: left FEMORAL TO BELOW KNEE POPLITEAL ARTERY BYPASS GREAFT using 48m removable ring propaten gore graft;  Surgeon: CWaynetta Sandy MD;  [facility]  Service: Vascular;  Laterality: Left;  . IR GENERIC HISTORICAL  01/27/2016   IR FLUORO GUIDE CV LINE RIGHT 01/27/2016 DArne Cleveland MD [facility]INTER[facility] RAD  . IR GENERIC HISTORICAL  01/27/2016   IR [place] ACCESS RIGHT 01/27/2016 DArne Cleveland MD [facility]  . LAPAROSCOPIC CHOLECYSTECTOMY  03/2011  . LEFT HEART CATH AND CORONARY ANGIOGRAPHY N/A 11/25/2017   Procedure: LEFT HEART CATH AND CORONARY ANGIOGRAPHY;   Surgeon: JMartinique Peter M, MD;  [facility];  Service: Cardiovascular;  Laterality: N/A;  . LEFT HEART CATH AND CORONARY ANGIOGRAPHY N/A 07/24/2018   Procedure: LEFT HEART CATH AND CORONARY ANGIOGRAPHY;  Surgeon: CSherren Mocha MD;  [facility];  Service: Cardiovascular;  Laterality: N/A;  . RENAL BIOPSY, PERCUTANEOUS Right 09/08/2014  . RIGHT/LEFT HEART CATH AND CORONARY ANGIOGRAPHY N/A 03/25/2018   Procedure: RIGHT/LEFT HEART CATH AND CORONARY ANGIOGRAPHY;  Surgeon: JMartinique Peter M, MD;  [place];  Service: Cardiovascular;  Laterality: N/A;  . TOE AMPUTATION Left 08/19/2018   2nd & 3rd toes  . TONSILLECTOMY  ~ 1960  . ULTRASOUND GUIDANCE FOR VASCULAR ACCESS  03/25/2018   Procedure: Ultrasound Guidance For Vascular Access;  Surgeon: JMartinique Peter M, MD;  [facility];  Service: Cardiovascular;;     Current Meds  Medication Sig  . acetaminophen (TYLENOL) 500 MG tablet Take 500-1,000 mg by mouth daily as needed (pain).   .Marland Kitchenaspirin EC 81 MG tablet Take 1 tablet (81 mg total) by mouth daily.  .Marland Kitchenatorvastatin (LIPITOR) 80 MG tablet Take 1 tablet (80 mg total) by mouth daily with supper.  .[name]  B Complex-C-Folic Acid (RENA-VITE RX) 1 MG TABS Take 1 tablet by mouth daily.  . cephALEXin (KEFLEX) 250 MG capsule Take 250 mg by mouth 2 (two) times daily. Started 08/12/2018-  Med to be finished 08/22/2018  . cetirizine (ZYRTEC) 10 MG tablet Take 10 mg by mouth daily.  . clopidogrel (PLAVIX) 75 MG tablet Take 1 tablet (75 mg total) by mouth daily with supper.  . Darbepoetin Alfa (ARANESP) 200 MCG/0.4ML SOSY injection Inject 0.4 mLs (200 mcg total) into the vein every Wednesday with hemodialysis.  [name] gabapentin (NEURONTIN) 300 MG capsule Take 1 capsule (300 mg total) by mouth every morning. (Patient taking differently: Take 300-600 mg by mouth See admin instructions. 300 mg in the morning, and 600 mg twice daily, lunch and dinner)  . LORazepam  (ATIVAN) 1 MG tablet Take 1 mg by mouth 2 (two) times daily as needed for anxiety.  . metoprolol tartrate (LOPRESSOR) 50 MG tablet Take 1 tablet (50 mg total) by mouth as directed.  . midodrine (PROAMATINE) 10 MG tablet Take 1 tablet (10 mg total) by mouth every Monday, Wednesday, and Friday with hemodialysis.  [name] multivitamin (RENA-VIT) TABS tablet Take 1 tablet by mouth at bedtime.  . mupirocin ointment (BACTROBAN) 2 % Apply 1 application topically daily.  [name] oxyCODONE (OXY IR/ROXICODONE) 5 MG immediate release tablet Take 1-2 tablets (5-10 mg total) by mouth every 4 (four) hours as needed for moderate pain.  . pantoprazole (PROTONIX) 40 MG tablet Take 1 tablet (40 mg total) by mouth daily.  . sucroferric oxyhydroxide (VELPHORO) 500 MG chewable tablet Chew 2 tablets (1,000 mg total) by mouth 3 (three) times daily with meals.  . vitamin B-12 (CYANOCOBALAMIN) 1000 MCG tablet Take 1,000 mcg by mouth daily.  . [DISCONTINUED] docusate sodium (COLACE) 100 MG capsule Take 1 capsule (100 mg total) by mouth 3 (three) times daily.     Allergies:   Patient has no known allergies.   Social History   Tobacco Use  . Smoking status: Former Smoker    Packs/day: 2.00    Years: 40.00    Pack years: 80.00    Types: Cigarettes    Quit date: 03/19/2011    Years since quitting: 7.5  . Smokeless tobacco: Never Used  Substance Use Topics  . Alcohol use: No    Alcohol/week: 0.0 standard drinks  . Drug use: No    Comment: 09/08/2014 "I have used marijuana till the 1990's". Notes that he quit 15 yrs ago     Family Hx: The patient's family history includes Diabetes in his father, mother, and sister; Heart disease in his mother; Hypertension in his father and mother; Skin cancer in his mother.  ROS:   Please see the history of present illness.     All other systems reviewed and are negative.   Prior CV studies:   The following studies were reviewed today:   Labs/Other Tests and Data Reviewed:    EKG:   No ECG reviewed.  Recent Labs: 03/23/2018: B Natriuretic Peptide 362.8; Magnesium 1.9 03/26/2018: TSH 1.591 07/22/2018: ALT 14 08/20/2018: BUN 36; Creatinine, Ser 8.65; Hemoglobin 9.5; Platelets 79; Potassium 4.5; Sodium 136   Recent Lipid Panel Lab Results  Component Value Date/Time   CHOL 117 02/12/2018 08:23 AM   TRIG 174 (H) 02/12/2018 08:23 AM   HDL 32 (L) 02/12/2018 08:23 AM   CHOLHDL 3.7 02/12/2018 08:23 AM   CHOLHDL 7.4 02/25/2014 09:59 AM   LDLCALC 50 02/12/2018 08:23 AM    Wt Readings  from Last 3 Encounters:  09/23/18 188 lb (85.3 kg)  09/18/18 186 lb 11.2 oz (84.7 kg)  09/11/18 189 lb (85.7 kg)     Objective:    Vital Signs:  Ht '5\' 10"'$  (1.778 m)   Wt 188 lb (85.3 kg)   BMI 26.98 kg/m    VITAL SIGNS:  reviewed  ASSESSMENT & PLAN:    1. Peripheral arterial disease with critical limb ischemia manifested by gangrenous toes on the left side.  He is status post femoral-popliteal bypass and resection of affected toes with subsequent healing.  He continues to deal with a left groin hematoma and currently following with vascular.   2. Coronary artery disease involving native coronary arteries: He is being treated medically with no significant angina. 3. Chronic systolic heart failure: Seems to be stable.  He is not able to tolerate heart failure medications due to hypotension during dialysis 4. End-stage renal disease on hemodialysis Monday Wednesday Friday. 5. Hyperlipidemia: Continue high-dose atorvastatin. 6. Severe aortic stenosis: Currently getting evaluation for TAVR.  COVID-19 Education: The signs and symptoms of COVID-19 were discussed with the patient and how to seek care for testing (follow up with PCP or arrange E-visit).  The importance of social distancing was discussed today.  Time:   Today, I have spent 8 minutes with the patient with telehealth technology discussing the above problems.     Medication Adjustments/Labs and Tests Ordered: Current  medicines are reviewed at length with the patient today.  Concerns regarding medicines are outlined above.   Tests Ordered: No orders of the defined types were placed in this encounter.   Medication Changes: No orders of the defined types were placed in this encounter.   Disposition:  Follow up in 6 month  Signed, Kathlyn Sacramento, MD  09/23/2018 9:26 AM     [facility]

## 2018-09-23 NOTE — Patient Instructions (Signed)
Medication Instructions:  NO CHANGE If you need a refill on your cardiac medications before your next appointment, please call your pharmacy.   Lab work: If you have labs (blood work) drawn today and your tests are completely normal, you will receive your results only by: [name] MyChart Message (if you have MyChart) OR . A paper copy in the mail If you have any lab test that is abnormal or we need to change your treatment, we will call you to review the results.  Follow-Up: At [facility], you and your health needs are our priority.  As part of our continuing mission to provide you with exceptional heart care, we have created designated Provider Care Teams.  These Care Teams include your primary Cardiologist (physician) and Advanced Practice Providers (APPs -  Physician Assistants and Nurse Practitioners) who all work together to provide you with the care you need, when you need it. You will need a follow up appointment in 6 months.  Please call our office 2 months in advance to schedule this appointment.  You may see Kathlyn Sacramento MD or one of the following Advanced Practice Providers on your designated Care Team:   Kerin Ransom, PA-C Roby Lofts, [place] . Sande Rives, PA-C

## 2018-09-24 ENCOUNTER — Encounter: Payer: Medicare Other | Admitting: Physical Medicine & Rehabilitation

## 2018-09-24 DIAGNOSIS — N2581 Secondary hyperparathyroidism of renal origin: Secondary | ICD-10-CM | POA: Diagnosis not present

## 2018-09-24 DIAGNOSIS — D631 Anemia in chronic kidney disease: Secondary | ICD-10-CM | POA: Diagnosis not present

## 2018-09-24 DIAGNOSIS — D509 Iron deficiency anemia, unspecified: Secondary | ICD-10-CM | POA: Diagnosis not present

## 2018-09-24 DIAGNOSIS — N186 End stage renal disease: Secondary | ICD-10-CM | POA: Diagnosis not present

## 2018-09-25 DIAGNOSIS — I2510 Atherosclerotic heart disease of native coronary artery without angina pectoris: Secondary | ICD-10-CM | POA: Diagnosis not present

## 2018-09-25 DIAGNOSIS — I132 Hypertensive heart and chronic kidney disease with heart failure and with stage 5 chronic kidney disease, or end stage renal disease: Secondary | ICD-10-CM | POA: Diagnosis not present

## 2018-09-25 DIAGNOSIS — Z95828 Presence of other vascular implants and grafts: Secondary | ICD-10-CM | POA: Diagnosis not present

## 2018-09-25 DIAGNOSIS — I214 Non-ST elevation (NSTEMI) myocardial infarction: Secondary | ICD-10-CM | POA: Diagnosis not present

## 2018-09-25 DIAGNOSIS — I739 Peripheral vascular disease, unspecified: Secondary | ICD-10-CM | POA: Diagnosis not present

## 2018-09-25 DIAGNOSIS — Z48812 Encounter for surgical aftercare following surgery on the circulatory system: Secondary | ICD-10-CM | POA: Diagnosis not present

## 2018-09-26 DIAGNOSIS — D509 Iron deficiency anemia, unspecified: Secondary | ICD-10-CM | POA: Diagnosis not present

## 2018-09-26 DIAGNOSIS — N2581 Secondary hyperparathyroidism of renal origin: Secondary | ICD-10-CM | POA: Diagnosis not present

## 2018-09-26 DIAGNOSIS — N186 End stage renal disease: Secondary | ICD-10-CM | POA: Diagnosis not present

## 2018-09-26 DIAGNOSIS — D631 Anemia in chronic kidney disease: Secondary | ICD-10-CM | POA: Diagnosis not present

## 2018-09-29 DIAGNOSIS — D509 Iron deficiency anemia, unspecified: Secondary | ICD-10-CM | POA: Diagnosis not present

## 2018-09-29 DIAGNOSIS — N2581 Secondary hyperparathyroidism of renal origin: Secondary | ICD-10-CM | POA: Diagnosis not present

## 2018-09-29 DIAGNOSIS — D631 Anemia in chronic kidney disease: Secondary | ICD-10-CM | POA: Diagnosis not present

## 2018-09-29 DIAGNOSIS — N186 End stage renal disease: Secondary | ICD-10-CM | POA: Diagnosis not present

## 2018-09-30 ENCOUNTER — Other Ambulatory Visit: Payer: Self-pay

## 2018-09-30 ENCOUNTER — Ambulatory Visit (HOSPITAL_COMMUNITY)
Admission: RE | Admit: 2018-09-30 | Discharge: 2018-09-30 | Disposition: A | Discharge status: Home / Self Care | Payer: Medicare Other | Source: Ambulatory Visit | Attending: Physician Assistant | Admitting: Physician Assistant

## 2018-09-30 DIAGNOSIS — R0602 Shortness of breath: Secondary | ICD-10-CM | POA: Diagnosis not present

## 2018-09-30 DIAGNOSIS — I350 Nonrheumatic aortic (valve) stenosis: Secondary | ICD-10-CM

## 2018-09-30 DIAGNOSIS — I132 Hypertensive heart and chronic kidney disease with heart failure and with stage 5 chronic kidney disease, or end stage renal disease: Secondary | ICD-10-CM | POA: Diagnosis not present

## 2018-09-30 DIAGNOSIS — I214 Non-ST elevation (NSTEMI) myocardial infarction: Secondary | ICD-10-CM | POA: Diagnosis not present

## 2018-09-30 DIAGNOSIS — R0609 Other forms of dyspnea: Secondary | ICD-10-CM | POA: Diagnosis not present

## 2018-09-30 DIAGNOSIS — I2510 Atherosclerotic heart disease of native coronary artery without angina pectoris: Secondary | ICD-10-CM | POA: Diagnosis not present

## 2018-09-30 DIAGNOSIS — Z48812 Encounter for surgical aftercare following surgery on the circulatory system: Secondary | ICD-10-CM | POA: Diagnosis not present

## 2018-09-30 DIAGNOSIS — I739 Peripheral vascular disease, unspecified: Secondary | ICD-10-CM | POA: Diagnosis not present

## 2018-09-30 DIAGNOSIS — Z95828 Presence of other vascular implants and grafts: Secondary | ICD-10-CM | POA: Diagnosis not present

## 2018-09-30 MED ORDER — IOHEXOL 350 MG/ML SOLN
100.0000 mL | Freq: Once | INTRAVENOUS | Status: AC | PRN
  Administered 2018-09-30: 100 mL via INTRAVENOUS

## 2018-10-01 DIAGNOSIS — N186 End stage renal disease: Secondary | ICD-10-CM | POA: Diagnosis not present

## 2018-10-01 DIAGNOSIS — N2581 Secondary hyperparathyroidism of renal origin: Secondary | ICD-10-CM | POA: Diagnosis not present

## 2018-10-01 DIAGNOSIS — D631 Anemia in chronic kidney disease: Secondary | ICD-10-CM | POA: Diagnosis not present

## 2018-10-01 DIAGNOSIS — D509 Iron deficiency anemia, unspecified: Secondary | ICD-10-CM | POA: Diagnosis not present

## 2018-10-02 DIAGNOSIS — I739 Peripheral vascular disease, unspecified: Secondary | ICD-10-CM | POA: Diagnosis not present

## 2018-10-02 DIAGNOSIS — I214 Non-ST elevation (NSTEMI) myocardial infarction: Secondary | ICD-10-CM | POA: Diagnosis not present

## 2018-10-02 DIAGNOSIS — Z95828 Presence of other vascular implants and grafts: Secondary | ICD-10-CM | POA: Diagnosis not present

## 2018-10-02 DIAGNOSIS — I2510 Atherosclerotic heart disease of native coronary artery without angina pectoris: Secondary | ICD-10-CM | POA: Diagnosis not present

## 2018-10-02 DIAGNOSIS — Z48812 Encounter for surgical aftercare following surgery on the circulatory system: Secondary | ICD-10-CM | POA: Diagnosis not present

## 2018-10-02 DIAGNOSIS — I132 Hypertensive heart and chronic kidney disease with heart failure and with stage 5 chronic kidney disease, or end stage renal disease: Secondary | ICD-10-CM | POA: Diagnosis not present

## 2018-10-03 ENCOUNTER — Other Ambulatory Visit: Payer: Self-pay | Admitting: Pharmacist

## 2018-10-03 DIAGNOSIS — N186 End stage renal disease: Secondary | ICD-10-CM | POA: Diagnosis not present

## 2018-10-03 DIAGNOSIS — I2510 Atherosclerotic heart disease of native coronary artery without angina pectoris: Secondary | ICD-10-CM | POA: Diagnosis not present

## 2018-10-03 DIAGNOSIS — Z48812 Encounter for surgical aftercare following surgery on the circulatory system: Secondary | ICD-10-CM | POA: Diagnosis not present

## 2018-10-03 DIAGNOSIS — I739 Peripheral vascular disease, unspecified: Secondary | ICD-10-CM | POA: Diagnosis not present

## 2018-10-03 DIAGNOSIS — I214 Non-ST elevation (NSTEMI) myocardial infarction: Secondary | ICD-10-CM | POA: Diagnosis not present

## 2018-10-03 DIAGNOSIS — D509 Iron deficiency anemia, unspecified: Secondary | ICD-10-CM | POA: Diagnosis not present

## 2018-10-03 DIAGNOSIS — D631 Anemia in chronic kidney disease: Secondary | ICD-10-CM | POA: Diagnosis not present

## 2018-10-03 DIAGNOSIS — Z95828 Presence of other vascular implants and grafts: Secondary | ICD-10-CM | POA: Diagnosis not present

## 2018-10-03 DIAGNOSIS — I132 Hypertensive heart and chronic kidney disease with heart failure and with stage 5 chronic kidney disease, or end stage renal disease: Secondary | ICD-10-CM | POA: Diagnosis not present

## 2018-10-03 DIAGNOSIS — N2581 Secondary hyperparathyroidism of renal origin: Secondary | ICD-10-CM | POA: Diagnosis not present

## 2018-10-03 MED ORDER — PANTOPRAZOLE SODIUM 40 MG PO TBEC: 40.0000 mg | DELAYED_RELEASE_TABLET | Freq: Every day | ORAL | 0 refills | Status: DC

## 2018-10-06 DIAGNOSIS — N2581 Secondary hyperparathyroidism of renal origin: Secondary | ICD-10-CM | POA: Diagnosis not present

## 2018-10-06 DIAGNOSIS — D631 Anemia in chronic kidney disease: Secondary | ICD-10-CM | POA: Diagnosis not present

## 2018-10-06 DIAGNOSIS — N186 End stage renal disease: Secondary | ICD-10-CM | POA: Diagnosis not present

## 2018-10-06 DIAGNOSIS — D509 Iron deficiency anemia, unspecified: Secondary | ICD-10-CM | POA: Diagnosis not present

## 2018-10-07 DIAGNOSIS — I739 Peripheral vascular disease, unspecified: Secondary | ICD-10-CM | POA: Diagnosis not present

## 2018-10-07 DIAGNOSIS — Z48812 Encounter for surgical aftercare following surgery on the circulatory system: Secondary | ICD-10-CM | POA: Diagnosis not present

## 2018-10-07 DIAGNOSIS — Z95828 Presence of other vascular implants and grafts: Secondary | ICD-10-CM | POA: Diagnosis not present

## 2018-10-07 DIAGNOSIS — I214 Non-ST elevation (NSTEMI) myocardial infarction: Secondary | ICD-10-CM | POA: Diagnosis not present

## 2018-10-07 DIAGNOSIS — I132 Hypertensive heart and chronic kidney disease with heart failure and with stage 5 chronic kidney disease, or end stage renal disease: Secondary | ICD-10-CM | POA: Diagnosis not present

## 2018-10-07 DIAGNOSIS — I2510 Atherosclerotic heart disease of native coronary artery without angina pectoris: Secondary | ICD-10-CM | POA: Diagnosis not present

## 2018-10-08 ENCOUNTER — Ambulatory Visit: Payer: Medicare Other | Admitting: Podiatry

## 2018-10-08 DIAGNOSIS — D509 Iron deficiency anemia, unspecified: Secondary | ICD-10-CM | POA: Diagnosis not present

## 2018-10-08 DIAGNOSIS — N2581 Secondary hyperparathyroidism of renal origin: Secondary | ICD-10-CM | POA: Diagnosis not present

## 2018-10-08 DIAGNOSIS — D631 Anemia in chronic kidney disease: Secondary | ICD-10-CM | POA: Diagnosis not present

## 2018-10-08 DIAGNOSIS — N186 End stage renal disease: Secondary | ICD-10-CM | POA: Diagnosis not present

## 2018-10-09 ENCOUNTER — Encounter: Payer: Self-pay | Admitting: Physical Medicine & Rehabilitation

## 2018-10-09 ENCOUNTER — Encounter: Payer: Medicare Other | Attending: Physical Medicine & Rehabilitation | Admitting: Physical Medicine & Rehabilitation

## 2018-10-09 ENCOUNTER — Other Ambulatory Visit: Payer: Self-pay

## 2018-10-09 VITALS — BP 99/63 | HR 85 | Temp 97.6°F | Ht 70.0 in | Wt 186.0 lb

## 2018-10-09 DIAGNOSIS — I739 Peripheral vascular disease, unspecified: Secondary | ICD-10-CM | POA: Diagnosis not present

## 2018-10-09 DIAGNOSIS — Z992 Dependence on renal dialysis: Secondary | ICD-10-CM | POA: Diagnosis not present

## 2018-10-09 DIAGNOSIS — G629 Polyneuropathy, unspecified: Secondary | ICD-10-CM | POA: Diagnosis not present

## 2018-10-09 DIAGNOSIS — I350 Nonrheumatic aortic (valve) stenosis: Secondary | ICD-10-CM | POA: Diagnosis not present

## 2018-10-09 DIAGNOSIS — I779 Disorder of arteries and arterioles, unspecified: Secondary | ICD-10-CM | POA: Diagnosis not present

## 2018-10-09 DIAGNOSIS — I5032 Chronic diastolic (congestive) heart failure: Secondary | ICD-10-CM | POA: Diagnosis not present

## 2018-10-09 DIAGNOSIS — N186 End stage renal disease: Secondary | ICD-10-CM

## 2018-10-09 DIAGNOSIS — R5381 Other malaise: Secondary | ICD-10-CM

## 2018-10-09 MED ORDER — LIDOCAINE 5 % EX PTCH: 1.0000 | MEDICATED_PATCH | CUTANEOUS | 0 refills | Status: DC

## 2018-10-09 NOTE — Progress Notes (Signed)
Subjective:    Patient ID: Jose Garrett, male    DOB: [birth year], 68 y.o.   MRN: [id number]  HPI 68 year old right-handed male with history of remote tobacco abuse end-stage renal disease due to cardiorenal syndrome in the setting of AL amyloidosis, aortic stenosis, hypertension CHF presents for hospital follow up after receiving CIR for debility secondary to fem-pop bypass complicated by NSTEMI.   Since discharge, he was was seen for further digit amputation, notes reviewed. He notes edema and SOB after most recent amputation.  He sees Cards next week, with likely plans for valve replacement.  He is seeing Vascular for hematoma to femoral incision site. He is seeing Nephro weekly. He notes his weights have been stable. Bowel movements are labile. Denies falls.  Therapies: Completed Mobility: Cane at all times DME: Shower chair, bedside commode.   Pain Inventory Average Pain 6 Pain Right Now 6 My pain is intermittent, sharp, burning, dull, stabbing, tingling and aching  In the last 24 hours, has pain interfered with the following? General activity 6 Relation with others 6 Enjoyment of life 6 What TIME of day is your pain at its worst? evening Sleep (in general) Poor  Pain is worse with: walking, bending, sitting, inactivity, standing, unsure and some activites Pain improves with: medication Relief from Meds: 7  Mobility walk with assistance use a cane ability to climb steps?  yes do you drive?  yes  Function retired I need assistance with the following:  household duties and shopping  Neuro/Psych tingling trouble walking spasms  Prior Studies Any changes since last visit?  no  Physicians involved in your care Any changes since last visit?  no   Family History  Problem Relation Age of Onset  . Hypertension Mother   . Diabetes Mother   . Heart disease Mother   . Skin cancer Mother   . Hypertension Father   . Diabetes Father   . Diabetes Sister    Social  History   Socioeconomic History  . Marital status: Divorced    Spouse name: Not on file  . Number of children: Not on file  . Years of education: Not on file  . Highest education level: Not on file  Occupational History  . Not on file  Social Needs  . Financial resource strain: Not on file  . Food insecurity    Worry: Not on file    Inability: Not on file  . Transportation needs    Medical: Not on file    Non-medical: Not on file  Tobacco Use  . Smoking status: Former Smoker    Packs/day: 2.00    Years: 40.00    Pack years: 80.00    Types: Cigarettes    Quit date: 03/19/2011    Years since quitting: 7.5  . Smokeless tobacco: Never Used  Substance and Sexual Activity  . Alcohol use: No    Alcohol/week: 0.0 standard drinks  . Drug use: No    Comment: 09/08/2014 "I have used marijuana till the 1990's". Notes that he quit 15 yrs ago  . Sexual activity: Not Currently  Lifestyle  . Physical activity    Days per week: Not on file    Minutes per session: Not on file  . Stress: Not on file  Relationships  . Social Herbalist on phone: Not on file    Gets together: Not on file    Attends religious service: Not on file    Active member of  club or organization: Not on file    Attends meetings of clubs or organizations: Not on file    Relationship status: Not on file  Other Topics Concern  . 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    Past Surgical History:  Procedure Laterality Date  . ABDOMINAL AORTOGRAM W/LOWER EXTREMITY Bilateral 07/16/2018   Procedure: ABDOMINAL AORTOGRAM W/LOWER EXTREMITY;  Surgeon: Wellington Hampshire, MD;  Location: [place] [facility];  Service: Cardiovascular;  Laterality: Bilateral;  . AMPUTATION Left 08/19/2018   Procedure: AMPUTATION LEFT TOES SECOND AND THIRD;  Surgeon: Waynetta Sandy, MD;   Location: [place];  Service: Vascular;  Laterality: Left;  . AV FISTULA PLACEMENT Right 12/27/2015   Procedure: Right Arm ARTERIOVENOUS (AV) FISTULA CREATION;  Surgeon: Angelia Mould, MD;  Location: [place] [facility];  Service: Vascular;  Laterality: Right;  . COLONOSCOPY    . FEMORAL-POPLITEAL BYPASS GRAFT Left 07/22/2018   Procedure: left FEMORAL TO BELOW KNEE POPLITEAL ARTERY BYPASS GREAFT using 22mm removable ring propaten gore graft;  Surgeon: Waynetta Sandy, MD;  Location: [place];  Service: Vascular;  Laterality: Left;  . IR GENERIC HISTORICAL  01/27/2016   IR FLUORO GUIDE CV LINE RIGHT 01/27/2016 Arne Cleveland, MD [facility]INTER[facility]  . IR GENERIC HISTORICAL  01/27/2016   IR US GUIDE VASC ACCESS RIGHT 01/27/2016 Arne Cleveland, MD [facility]  . LAPAROSCOPIC CHOLECYSTECTOMY  03/2011  . LEFT HEART CATH AND CORONARY ANGIOGRAPHY N/A 11/25/2017   Procedure: LEFT HEART CATH AND CORONARY ANGIOGRAPHY;  Surgeon: Martinique, Peter M, MD;  Location: [place] [facility];  Service: Cardiovascular;  Laterality: N/A;  . LEFT HEART CATH AND CORONARY ANGIOGRAPHY N/A 07/24/2018   Procedure: LEFT HEART CATH AND CORONARY ANGIOGRAPHY;  Surgeon: Sherren Mocha, MD;  Location: [place] [facility];  Service: Cardiovascular;  Laterality: N/A;  . RENAL BIOPSY, PERCUTANEOUS Right 09/08/2014  . RIGHT/LEFT HEART CATH AND CORONARY ANGIOGRAPHY N/A 03/25/2018   Procedure: RIGHT/LEFT HEART CATH AND CORONARY ANGIOGRAPHY;  Surgeon: Martinique, Peter M, MD;  Location: [place] [facility];  Service: Cardiovascular;  Laterality: N/A;  . TOE AMPUTATION Left 08/19/2018   2nd & 3rd toes  . TONSILLECTOMY  ~ 1960  . ULTRASOUND GUIDANCE FOR VASCULAR ACCESS  03/25/2018   Procedure: Ultrasound Guidance For Vascular Access;  Surgeon: Martinique, Peter M, MD;  Location: [place] [facility];  Service: Cardiovascular;;   Past Medical History:  Diagnosis Date  . Amyloidosis ([place])   . Anemia Dx 2016  . Anxiety    "especially with procedure"- 07/21/2018   . Aortic stenosis   . CAD (coronary artery disease)    a. cardiac cath on 11/25/17 with occlusion of the RCA which could have been his event but the RCA could not be engaged. The RCA filled distally from left to right collaterals. There were no severe stenoses in the left coronary system. Medical therapy recommended, started on plavix.  . Cancer ([place])   . Chronic combined systolic and diastolic CHF (congestive heart failure) ([place])    a. EF dropped to 35-40% with grade 2 DD by echo 11/2017.  [name] Diarrhea   . ESRD (end stage renal disease) ([place])    ESRD- HEMO MWF- [employer] [employer]  . Family history of adverse reaction to anesthesia    "my daughter can't take certain anesthesia agents" (09/08/2014)  .  GERD (gastroesophageal reflux disease)   . Heart murmur   . History of blood transfusion 09/08/2014   "got hematoma after renal biopsy & HgB dropped"  . Hyperlipidemia Dx 2012  . Hypertension Dx 2012  . Myocardial infarction ([place]) 11/2017  . Neuropathy   . Peripheral vascular disease ([place])   . Pneumonia   . Restless legs    BP 99/63   Pulse 85   Temp 97.6 F (36.4 C)   Ht 5\' 10"  (1.778 m)   Wt 186 lb (84.4 kg)   SpO2 94%   BMI 26.69 kg/m   Opioid Risk Score:   Fall Risk Score:  `1  Depression screen PHQ 2/9  Depression screen [facility] 2/9 10/09/2018 09/11/2018 08/21/2018 08/22/2015  Decreased Interest 0 1 1 0  Down, Depressed, Hopeless 0 1 1 0  PHQ - 2 Score 0 2 2 0  Altered sleeping 3 0 2 0  Tired, decreased energy 2 1 2  0  Change in appetite 0 0 0 0  Feeling bad or failure about yourself  0 0 1 0  Trouble concentrating 0 0 1 0  Moving slowly or fidgety/restless 0 1 1 0  Suicidal thoughts 0 0 0 0  PHQ-9 Score 5 4 9  0  Difficult doing work/chores Somewhat difficult Somewhat difficult Somewhat difficult -  Some recent data might be hidden    Review of Systems  Constitutional: Negative.   HENT: Negative.   Eyes: Negative.   Respiratory: Positive for shortness of breath.    Cardiovascular: Negative.   Gastrointestinal: Negative.   Endocrine: Negative.   Genitourinary: Negative.   Musculoskeletal: Positive for gait problem.       Spasms  Skin: Negative.   Allergic/Immunologic: Negative.   Neurological:       Tingling  Hematological: Negative.   Psychiatric/Behavioral: Negative.   All other systems reviewed and are negative.      Objective:   Physical Exam Constitutional: No distress . Vital signs reviewed. HENT: Normocephalic. Atraumatic.  Eyes: EOMI. No discharge. Cardiovascular: No JVD. Respiratory: Normal effort. GI: Non -distended. Musculoskeletal: LLE > RLE edema and tenderness LLE 2nd, 3rd digit amputation Neurological:  Alert and oriented Follows commands.  Motor: Right lower extremity: 4-4+/5 proximal distal, stable Left lower extremity: Hip flexion, knee extension 3+/5, ankle dorsiflexion 4/5 Skin: Skin is warm. He is not diaphoretic.  Amputation site c/d/i Psychiatric: He has a normal mood and affect. His behavior is normal. Judgment and thought content normal.     Assessment & Plan:  Right-handed male with history of remote tobacco abuse end-stage renal disease due to cardiorenal syndrome in the setting of AL amyloidosis, aortic stenosis, hypertension CHF presents for hospital follow up after receiving CIR for debility secondary to fem-pop bypass complicated by NSTEMI.   1.  Debility secondary to PAD status post left common femoral to below-knee popliteal artery bypass 9/35/7017 complicated by non-STEMI with subsequent LLE 2nd, 3rd digit amputation  Cont HEP  Cont follow up with Vasc - plans for ?vein replacement RLE  2. Peripheral Neuropathy:   Cont Neurontin 300 mg every evening  Trial lidocaine patched. Discussed OTC option is not approved.    Oxycodone as needed  3.  Aortic stenosis.   Follow up with Cards, plans for valve replacement  4.  End-stage renal disease.    Continues to have sig edema in LE  Recs per Nephro    5.  Chronic combined diastolic systolic congestive heart failure with EF of 35-40%  Cont monitor weights  Stable per pt  6. Consitpation  Cont Prn meds  7.  Severe CAD  Follow up with Cards  Meds reviewed Referrals reviewed All questions answered

## 2018-10-10 ENCOUNTER — Encounter: Payer: Self-pay | Admitting: Physician Assistant

## 2018-10-10 DIAGNOSIS — I739 Peripheral vascular disease, unspecified: Secondary | ICD-10-CM | POA: Diagnosis not present

## 2018-10-10 DIAGNOSIS — I214 Non-ST elevation (NSTEMI) myocardial infarction: Secondary | ICD-10-CM | POA: Diagnosis not present

## 2018-10-10 DIAGNOSIS — I132 Hypertensive heart and chronic kidney disease with heart failure and with stage 5 chronic kidney disease, or end stage renal disease: Secondary | ICD-10-CM | POA: Diagnosis not present

## 2018-10-10 DIAGNOSIS — N186 End stage renal disease: Secondary | ICD-10-CM | POA: Diagnosis not present

## 2018-10-10 DIAGNOSIS — D509 Iron deficiency anemia, unspecified: Secondary | ICD-10-CM | POA: Diagnosis not present

## 2018-10-10 DIAGNOSIS — Z48812 Encounter for surgical aftercare following surgery on the circulatory system: Secondary | ICD-10-CM | POA: Diagnosis not present

## 2018-10-10 DIAGNOSIS — D631 Anemia in chronic kidney disease: Secondary | ICD-10-CM | POA: Diagnosis not present

## 2018-10-10 DIAGNOSIS — N2581 Secondary hyperparathyroidism of renal origin: Secondary | ICD-10-CM | POA: Diagnosis not present

## 2018-10-10 DIAGNOSIS — Z95828 Presence of other vascular implants and grafts: Secondary | ICD-10-CM | POA: Diagnosis not present

## 2018-10-10 DIAGNOSIS — I2510 Atherosclerotic heart disease of native coronary artery without angina pectoris: Secondary | ICD-10-CM | POA: Diagnosis not present

## 2018-10-11 DIAGNOSIS — N186 End stage renal disease: Secondary | ICD-10-CM | POA: Diagnosis not present

## 2018-10-11 DIAGNOSIS — I129 Hypertensive chronic kidney disease with stage 1 through stage 4 chronic kidney disease, or unspecified chronic kidney disease: Secondary | ICD-10-CM | POA: Diagnosis not present

## 2018-10-11 DIAGNOSIS — Z992 Dependence on renal dialysis: Secondary | ICD-10-CM | POA: Diagnosis not present

## 2018-10-13 DIAGNOSIS — D509 Iron deficiency anemia, unspecified: Secondary | ICD-10-CM | POA: Diagnosis not present

## 2018-10-13 DIAGNOSIS — D696 Thrombocytopenia, unspecified: Secondary | ICD-10-CM | POA: Diagnosis not present

## 2018-10-13 DIAGNOSIS — N186 End stage renal disease: Secondary | ICD-10-CM | POA: Diagnosis not present

## 2018-10-13 DIAGNOSIS — N2581 Secondary hyperparathyroidism of renal origin: Secondary | ICD-10-CM | POA: Diagnosis not present

## 2018-10-13 DIAGNOSIS — D631 Anemia in chronic kidney disease: Secondary | ICD-10-CM | POA: Diagnosis not present

## 2018-10-13 DIAGNOSIS — Z992 Dependence on renal dialysis: Secondary | ICD-10-CM | POA: Diagnosis not present

## 2018-10-14 ENCOUNTER — Encounter: Payer: Self-pay | Admitting: Thoracic Surgery (Cardiothoracic Vascular Surgery)

## 2018-10-14 ENCOUNTER — Encounter: Payer: Self-pay | Admitting: Physical Therapy

## 2018-10-14 ENCOUNTER — Other Ambulatory Visit: Payer: Self-pay

## 2018-10-14 ENCOUNTER — Ambulatory Visit: Payer: Medicare Other | Attending: Physician Assistant | Admitting: Physical Therapy

## 2018-10-14 ENCOUNTER — Institutional Professional Consult (permissible substitution) (INDEPENDENT_AMBULATORY_CARE_PROVIDER_SITE_OTHER): Payer: Medicare Other | Admitting: Thoracic Surgery (Cardiothoracic Vascular Surgery)

## 2018-10-14 VITALS — BP 110/68 | HR 93 | Temp 97.7°F | Resp 16 | Ht 70.0 in | Wt 186.0 lb

## 2018-10-14 DIAGNOSIS — I350 Nonrheumatic aortic (valve) stenosis: Secondary | ICD-10-CM

## 2018-10-14 DIAGNOSIS — I5032 Chronic diastolic (congestive) heart failure: Secondary | ICD-10-CM

## 2018-10-14 DIAGNOSIS — R2689 Other abnormalities of gait and mobility: Secondary | ICD-10-CM | POA: Insufficient documentation

## 2018-10-14 DIAGNOSIS — I779 Disorder of arteries and arterioles, unspecified: Secondary | ICD-10-CM | POA: Diagnosis not present

## 2018-10-14 DIAGNOSIS — N186 End stage renal disease: Secondary | ICD-10-CM | POA: Diagnosis not present

## 2018-10-14 DIAGNOSIS — Z992 Dependence on renal dialysis: Secondary | ICD-10-CM | POA: Diagnosis not present

## 2018-10-14 NOTE — Addendum Note (Signed)
Addended by: Carney Living on: 10/14/2018 03:38 PM   Modules accepted: Orders

## 2018-10-14 NOTE — Therapy (Signed)
[place], [place], [address] Phone: [phone]   Fax:  [phone]  Physical Therapy Evaluation  Patient Details  Name: Jose Garrett MRN: [id number] Date of Birth: [birth year] Referring Provider (PT): Angelena Form PA-C    Encounter Date: 10/14/2018  PT End of Session - 10/14/18 1508    Visit Number  1    Number of Visits  1    Date for PT Re-Evaluation  10/14/18    PT Start Time  6222    PT Stop Time  1455    PT Time Calculation (min)  40 min    Equipment Utilized During Treatment  Gait belt    Activity Tolerance  Patient tolerated treatment well    Behavior During Therapy  [facility] for tasks assessed/performed       Past Medical History:  Diagnosis Date  . Amyloidosis ([place])   . Anemia Dx 2016  . Anxiety    "especially with procedure"- 07/21/2018  . Aortic stenosis   . CAD (coronary artery disease)    a. cardiac cath on 11/25/17 with occlusion of the RCA which could have been his event but the RCA could not be engaged. The RCA filled distally from left to right collaterals. There were no severe stenoses in the left coronary system. Medical therapy recommended, started on plavix.  . Chronic combined systolic and diastolic CHF (congestive heart failure) ([place])    a. EF dropped to 35-40% with grade 2 DD by echo 11/2017.  [name] ESRD (end stage renal disease) ([place])    ESRD- HEMO MWF- [facility]  . GERD (gastroesophageal reflux disease)   . Hyperlipidemia Dx 2012  . Hypertension Dx 2012  . Neuropathy   . Peripheral vascular disease ([place])   . Pneumonia   . Restless legs     Past Surgical History:  Procedure Laterality Date  . ABDOMINAL AORTOGRAM W/LOWER EXTREMITY Bilateral 07/16/2018   Procedure: ABDOMINAL AORTOGRAM W/LOWER EXTREMITY;  Surgeon: Wellington Hampshire, MD;  Location: [place] [facility];  Service: Cardiovascular;  Laterality: Bilateral;  . AMPUTATION Left 08/19/2018   Procedure: AMPUTATION LEFT  TOES SECOND AND THIRD;  Surgeon: Waynetta Sandy, MD;  Location: [name];  Service: Vascular;  Laterality: Left;  . AV FISTULA PLACEMENT Right 12/27/2015   Procedure: Right Arm ARTERIOVENOUS (AV) FISTULA CREATION;  Surgeon: Angelia Mould, MD;  Location: [place];  Service: Vascular;  Laterality: Right;  . COLONOSCOPY    . FEMORAL-POPLITEAL BYPASS GRAFT Left 07/22/2018   Procedure: left FEMORAL TO BELOW KNEE POPLITEAL ARTERY BYPASS GREAFT using 29mm removable ring propaten gore graft;  Surgeon: Waynetta Sandy, MD;  Location: [place];  Service: Vascular;  Laterality: Left;  . IR GENERIC HISTORICAL  01/27/2016   IR FLUORO GUIDE CV LINE RIGHT 01/27/2016 Arne Cleveland, MD [facility]  . IR GENERIC HISTORICAL  01/27/2016   IR US GUIDE VASC ACCESS RIGHT 01/27/2016 Arne Cleveland, MD [facility]  . LAPAROSCOPIC CHOLECYSTECTOMY  03/2011  . LEFT HEART CATH AND CORONARY ANGIOGRAPHY N/A 11/25/2017   Procedure: LEFT HEART CATH AND CORONARY ANGIOGRAPHY;  Surgeon: Martinique, Peter M, MD;  Location: [place] [facility];  Service: Cardiovascular;  Laterality: N/A;  . LEFT HEART CATH AND CORONARY ANGIOGRAPHY N/A 07/24/2018   Procedure: LEFT HEART CATH AND CORONARY ANGIOGRAPHY;  Surgeon: Sherren Mocha, MD;  Location: [place] [facility];  Service: Cardiovascular;  Laterality: N/A;  . RENAL BIOPSY, PERCUTANEOUS Right 09/08/2014  . RIGHT/LEFT HEART CATH AND  CORONARY ANGIOGRAPHY N/A 03/25/2018   Procedure: RIGHT/LEFT HEART CATH AND CORONARY ANGIOGRAPHY;  Surgeon: Martinique, Peter M, MD;  Location: [place] [facility];  Service: Cardiovascular;  Laterality: N/A;  . TOE AMPUTATION Left 08/19/2018   2nd & 3rd toes  . TONSILLECTOMY  ~ 1960  . ULTRASOUND GUIDANCE FOR VASCULAR ACCESS  03/25/2018   Procedure: Ultrasound Guidance For Vascular Access;  Surgeon: Martinique, Peter M, MD;  Location: [place] [facility];  Service: Cardiovascular;;    There were no vitals filed for this visit.   Subjective Assessment  - 10/14/18 1418    Subjective  Patient had a toe amputation about 3 months ago. From that point on he began having shortness of breath with walking. He also has neuropathy and back pain. He uses a cane for ambualtion.    Limitations  Standing;Walking    Currently in Pain?  Yes    Pain Score  7     Pain Location  Foot    Pain Orientation  Right    Pain Descriptors / Indicators  Aching    Pain Type  Chronic pain    Pain Onset  More than a month ago    Pain Frequency  Constant    Aggravating Factors   standing and walking    Pain Relieving Factors  not walking    Effect of Pain on Daily Activities  difficulty walking    Multiple Pain Sites  Yes    Pain Score  --   " tough to tell":   Pain Location  Back    Pain Orientation  Right    Pain Descriptors / Indicators  Aching    Pain Type  Chronic pain    Pain Onset  More than a month ago    Aggravating Factors   standing and walking    Pain Relieving Factors  rest         [facility] PT Assessment - 10/14/18 0001      Assessment   Medical Diagnosis  Severe Aortic Stenois     Referring Provider (PT)  Angelena Form PA-C     Onset Date/Surgical Date  --   3 months prior    Hand Dominance  Right    Next MD Visit  today     Prior Therapy  None       Precautions   Precautions  Fall      Restrictions   Weight Bearing Restrictions  No      Balance Screen   Has the patient fallen in the past 6 months  Yes    How many times?  1    Has the patient had a decrease in activity level because of a fear of falling?   Yes    Is the patient reluctant to leave their home because of a fear of falling?   Yes      [place] residence    Additional Comments  no stairs at home      Prior Function   Level of Independence  Independent with community mobility with device      Cognition   Overall Cognitive Status  Within Functional Limits for tasks assessed    Attention  Focused      Sensation   Light Touch   Appears Intact    Additional Comments  parathesias in his feet       Coordination   Gross Motor Movements are Fluid and Coordinated  Yes  Fine Motor Movements are Fluid and Coordinated  Yes      ROM / Strength   AROM / PROM / Strength  AROM;Strength      AROM   Overall AROM Comments  normal AROM of bolth shoulders       Strength   Overall Strength Comments  right hip flexion 3+/5 right knee extnesion 4/5; right shoulder strength 4/5    Strength Assessment Site  Hand    Right/Left hand  Right;Left    Right Hand Grip (lbs)  20   dialysis peort in right forearm advised not to oversqueeze   Left Hand Grip (lbs)  40      Ambulation/Gait   Gait Comments  flexed trunk; decreased bilateral hip flexion        [facility] Pre-Surgical Assessment - 10/14/18 0001    5 Meter Walk Test- trial 1  6 sec    5 Meter Walk Test- trial 2  6 sec.     5 Meter Walk Test- trial 3  5 sec.    5 meter walk test average  5.67 sec    4 Stage Balance Test tolerated for:   10 sec.    4 Stage Balance Test Position  2    comment  needed support for full tandem     Sit To Stand Test- trial 1  0 sec.    Comment  unable to stand without using his hands on the chair and his cane.     ADL/IADL Independent with:  Bathing;Dressing;Meal prep;Finances    ADL/IADL Needs Assistance with:  Yard work    6 Minute Walk- Baseline  yes    BP (mmHg)  101/65    HR (bpm)  97    02 Sat (%RA)  87 %    Modified Borg Scale for Dyspnea  2- Mild shortness of breath    Perceived Rate of Exertion (Borg)  6-    6 Minute Walk Post Test  yes    BP (mmHg)  113/66    HR (bpm)  103    02 Sat (%RA)  94 %    Modified Borg Scale for Dyspnea  3- Moderate shortness of breath or breathing difficulty    Perceived Rate of Exertion (Borg)  13- Somewhat hard    Aerobic Endurance Distance Walked  144    Endurance additional comments  3 seated rest breaks 92% disability               Objective measurements completed on examination: See  above findings.              PT Education - 10/14/18 1507    Education Details  pupose of testing    Person(s) Educated  Patient    Methods  Explanation;Demonstration;Tactile cues;Verbal cues    Comprehension  Verbalized understanding;Returned demonstration;Verbal cues required;Tactile cues required                  Plan - 10/14/18 1508    Clinical Impression Statement  see below    Stability/Clinical Decision Making  Evolving/Moderate complexity    Clinical Decision Making  Low    PT Frequency  One time visit    PT Treatment/Interventions  Patient/family education    Consulted and Agree with Plan of Care  Patient       PHYSICAL THERAPY DISCHARGE SUMMARY   Clinical Impression Statement: Pt is a 68 yo male presenting to OP PT for evaluation prior to possible TAVR surgery due to  severe aortic stenosis. Pt reports onset of dyspnea approximately months ago. Symptoms are limiting ability to ambulate community distances. Pt presents with decreased right ROM and strength, decreased balance and is at high fall risk 4 stage balance test, decrease walking speed and decreased aerobic endurance per 6 minute walk test. Pt ambulated 100 feet in 1:30 before requesting a seated rest beak lasting 1:40. At time of rest, patient's HR was 101 bpm and O2 was 94% on room air. Pt reported 3/10 shortness of breath on modified scale for dyspnea. Pt able to resume after rest and ambulate an additional 24 feet in 40 sec before requiring a second seated rest break HR was 101 and sao2 93%. After a rest break of 1:40 he was able to stand and walk another 20' boefroe requiring another seated rest break. Final Sao2 was 94% and HR 103 bpm Pt ambulated a total of  feet in 6 minute walk. B/P increased significantly with 6 minute walk test. Based on the Short Physical Performance Battery, patient has a frailty rating of 4/12 with </= 5/12 considered frail.       Patient will benefit from skilled  therapeutic intervention in order to improve the following deficits and impairments:  Abnormal gait  Visit Diagnosis: 1. Other abnormalities of gait and mobility        Problem List Patient Active Problem List   Diagnosis Date Noted  . Aortic valve stenosis 10/09/2018  . Left foot infection 09/12/2018  . Leukocytosis   . Chronic diastolic congestive heart failure ([place])   . Gastroesophageal reflux disease   . Coronary artery disease involving native coronary artery of native heart without angina pectoris   . Drug induced constipation   . Obstipation   . Acute on chronic anemia   . Thrombocytopenia ([place])   . Chronic combined systolic and diastolic congestive heart failure ([place])   . ESRD on dialysis ([place])   . Hemodialysis-associated hypotension   . Acute blood loss anemia   . Debility 07/28/2018  . PAD (peripheral artery disease) ([place]) 07/22/2018  . Shortness of breath 03/25/2018  . Chest pain   . Dyspnea 03/23/2018  . Acute on chronic combined systolic and diastolic CHF (congestive heart failure) ([place]) 11/30/2017  . Non-ST elevation (NSTEMI) myocardial infarction ([place])   . Elevated troponin 11/25/2017  . Respiratory failure ([place])   . Neuropathy 03/14/2017  . Fever   . CKD (chronic kidney disease) stage 5, GFR less than 15 ml/min (H[facility])   . ESRD (end stage renal disease) on dialysis ([place])   . Acute renal failure superimposed on chronic kidney disease ([place])   . Hypervolemia   . Acute diastolic (congestive) heart failure ([place]) 01/23/2016  . Fluid overload, unspecified 01/23/2016  . Acute pulmonary edema ([place]) 01/23/2016  . Skin tear of right forearm without complication 25/85/2778  . Iron deficiency anemia 11/22/2015  . Nonrheumatic aortic valve stenosis 08/22/2015  . Chronic kidney disease with dialysis modality undecided, stage 5 ([place]): Progressive 08/01/2015  . B12 deficiency 07/19/2015  . Seasonal allergies 06/30/2015  . Back pain 03/18/2015  . Hip pain 02/18/2015  .  AL amyloidosis ([place]) 09/25/2014  . Anemia of chronic disease 09/25/2014  . Abnormal bruising 09/25/2014  . Renal hematoma, right 09/08/2014  . Chronic diastolic heart failure, NYHA class 1 ([place]) 09/08/2014  . Hematuria   . Proteinuria   . Vitamin D deficiency 01/21/2014  . Hypertriglyceridemia 01/21/2014  . Essential hypertension, benign 11/06/2013  . Dependent edema 11/06/2013  . DOE (  dyspnea on exertion) 11/06/2013  . Other malaise and fatigue 11/06/2013    Carney Living PT DPT  10/14/2018, 3:15 PM  [facility] [address] [place], [place], [address] Phone: [phone]   Fax:  [phone]  Name: Jose Garrett MRN: [id number] Date of Birth: 02/14/1951

## 2018-10-14 NOTE — Patient Instructions (Addendum)
    Continue taking all current medications without change through the day before surgery.  Have nothing to eat or drink after midnight the night before surgery.  On the morning of surgery take only Protonix with a sip of water.

## 2018-10-14 NOTE — Progress Notes (Addendum)
HEART AND [place] SURGERY CONSULTATION REPORT  Primary Cardiologist is Lauree Chandler, MD PCP is [name], MD  Chief Complaint  Patient presents with   Aortic Stenosis    Surgical eval for TAVR, review all testing    HPI:  Patient has 68 year old male with complicated past medical history including history of aortic stenosis, coronary artery disease status post acute myocardial infarction in 2019, single-vessel coronary artery disease with chronic occlusion of the right coronary artery, chronic combined systolic and diastolic congestive heart failure, amyloidosis with renal involvement, hypertension, end-stage renal disease for which the patient has been dialysis dependent for more than 3 years, chronic anemia, peripheral arterial disease status post left femoral popliteal bypass using Gore-Tex graft and status post amputation multiple toes left foot, chronic seroma left groin, peripheral neuropathy, degenerative disc disease of the lumbar spine, restless leg syndrome, and GE reflux disease who has been referred for surgical consultation to discuss treatment options for management of severe symptomatic aortic stenosis.  Patient's cardiac history dates back to September 2019 the patient was hospitalized with acute respiratory failure in the setting of hypertensive urgency.  Troponin levels were elevated and EKG revealed diffuse ST segment depression.  Cardiac catheterization revealed occlusion of the right coronary artery with nonobstructive disease in the left coronary territory.  The right coronary artery could not be engaged.  He was treated medically.  Follow-up echocardiogram performed December 19, 2017 revealed normal left ventricular systolic function with moderate concentric hypertrophy and findings worrisome for possible amyloid infiltration.  The valve was felt to be functionally bicuspid with moderate aortic  stenosis, peak velocity across aortic valve reported 3.85 m/s corresponding to mean transvalvular gradient estimated 31 mmHg.  Patient remained stable until January of this year when he was hospitalized again with an exacerbation of congestive heart failure.  Echocardiogram performed at that time revealed what was felt to be significant drop in left ventricular ejection fraction to 45%.  Transvalvular gradient across the aortic valve was unchanged.  The patient was seen in follow-up by Dr. Angelena Form in February of this year but plans for further evaluation to consider transcatheter aortic valve replacement were delayed because of COVID-19.  In May the patient was hospitalized with critical limb ischemia involving his left lower extremity with 2 gangrenous toes.  He was found to have bilateral chronic occlusion of the superficial femoral arteries as well as the left popliteal artery.  He underwent left common femoral to below-knee popliteal artery bypass graft using synthetic Gore-Tex graft.  Postoperatively he suffered an acute exacerbation of chronic combined systolic and diastolic congestive heart failure with mildly positive troponins consistent with non-ST segment elevation myocardial infarction.  Catheterization revealed no significant change in his coronary artery disease with chronic occlusion of the right coronary artery and no significant disease in the left coronary circulation.  Echocardiogram revealed further drop in left ventricular systolic function with ejection fraction estimated 35 to 40%.  There was at least moderate and probably severe aortic stenosis.  Peak velocity across aortic valve measured as high as 3.7 m/s corresponding to mean transvalvular gradient estimated 30 mmHg and aortic valve area calculated 0.87 cm.  The DVI was notably only 0.20.  His subsequent recovery was slow and he later underwent amputation of his gangrenous toes.  He had a prolonged recovery requiring inpatient rehab.  He  developed a large seroma in the left groin associated with his surgical incision which has been treated conservatively.  More recently he has been having problems tolerating dialysis with tendency to get hypotension towards the completion of dialysis treatments.  CT angiography was performed to evaluate the feasibility of TAVR and the patient referred for surgical consultation.  The patient is divorced and has lived in [place] for many years.  He previously lived alone although recently he has been staying with his daughter since he was released from rehab.  He remains close friends with his ex-wife.  He has 3 adult children.  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  He states that for the past year he has been severely limited by exertional shortness of breath.  He states that on a good day he can only walk short distance without getting breathless.  At present he denies resting shortness of breath, PND, or orthopnea.  He occasionally gets tightness across his chest with more strenuous exertion such as performing his 6-minute walk test with physical therapy.  He has severe chronic lower extremity edema.  His mobility is somewhat limited due to poor balance and peripheral neuropathy.  He ambulates using a cane.  He has had a total of 2 mechanical falls over the past 5 years.  By necessity he lives a sedentary lifestyle.  He dialyzes 3 times a week on a Monday Wednesday Friday schedule at the [facility] dialysis center via right forearm primary AV fistula.  Past Medical History:  Diagnosis Date   Acute myocardial infarction involving right coronary artery (HCC)    Amyloidosis ([place])    Anemia    Anxiety    Aortic stenosis    CAD (coronary artery disease)    a. cardiac cath on 11/25/17 with occlusion of the RCA which could have been his event but the RCA could not be engaged. The RCA filled distally from left to right  collaterals. There were no severe stenoses in the left coronary system. Medical therapy recommended, started on plavix.   Chronic combined systolic and diastolic CHF (congestive heart failure) ([place])    a. EF dropped to 35-40% with grade 2 DD by echo 11/2017.   DJD (degenerative joint disease), lumbar    ESRD (end stage renal disease) ([place])    ESRD- HEMO MWF- [address]   GERD (gastroesophageal reflux disease)    Hyperlipidemia    Hypertension    Neuropathy    Peripheral vascular disease (HCC)    Pneumonia    Restless legs     Past Surgical History:  Procedure Laterality Date   ABDOMINAL AORTOGRAM W/LOWER EXTREMITY Bilateral 07/16/2018   Procedure: ABDOMINAL AORTOGRAM W/LOWER EXTREMITY;  Surgeon: Wellington Hampshire, MD;  Location: [place] [facility];  Service: Cardiovascular;  Laterality: Bilateral;   AMPUTATION Left 08/19/2018   Procedure: AMPUTATION LEFT TOES SECOND AND THIRD;  Surgeon: Waynetta Sandy, MD;  Location: [place];  Service: Vascular;  Laterality: Left;   AV FISTULA PLACEMENT Right 12/27/2015   Procedure: Right Arm ARTERIOVENOUS (AV) FISTULA CREATION;  Surgeon: Angelia Mould, MD;  Location: [place];  Service: Vascular;  Laterality: Right;   COLONOSCOPY     ERCP W/ SPHICTEROTOMY     FEMORAL-POPLITEAL BYPASS GRAFT Left 07/22/2018   Procedure: left FEMORAL TO BELOW KNEE POPLITEAL ARTERY BYPASS GREAFT using 40m removable ring propaten gore graft;  Surgeon: CWaynetta Sandy MD;  Location: [place]  Service: Vascular;  Laterality: Left;   IR GENERIC HISTORICAL  01/27/2016   IR FLUORO GUIDE CV  LINE RIGHT 01/27/2016 Arne Cleveland, MD [facility]   IR GENERIC HISTORICAL  01/27/2016   IR US GUIDE VASC ACCESS RIGHT 01/27/2016 Arne Cleveland, MD [facility]   LAPAROSCOPIC CHOLECYSTECTOMY  03/2011   LAPAROSCOPIC CHOLECYSTECTOMY     LEFT HEART CATH AND CORONARY ANGIOGRAPHY N/A 11/25/2017   Procedure: LEFT HEART CATH AND CORONARY ANGIOGRAPHY;   Surgeon: Martinique, Peter M, MD;  Location: [place] [facility];  Service: Cardiovascular;  Laterality: N/A;   LEFT HEART CATH AND CORONARY ANGIOGRAPHY N/A 07/24/2018   Procedure: LEFT HEART CATH AND CORONARY ANGIOGRAPHY;  Surgeon: Sherren Mocha, MD;  Location: [place] [facility];  Service: Cardiovascular;  Laterality: N/A;   RENAL BIOPSY, PERCUTANEOUS Right 09/08/2014   RIGHT/LEFT HEART CATH AND CORONARY ANGIOGRAPHY N/A 03/25/2018   Procedure: RIGHT/LEFT HEART CATH AND CORONARY ANGIOGRAPHY;  Surgeon: Martinique, Peter M, MD;  Location: [employer] [facility];  Service: Cardiovascular;  Laterality: N/A;   TOE AMPUTATION Left 08/19/2018   2nd & 3rd toes   TONSILLECTOMY  ~ 1960   ULTRASOUND GUIDANCE FOR VASCULAR ACCESS  03/25/2018   Procedure: Ultrasound Guidance For Vascular Access;  Surgeon: Martinique, Peter M, MD;  Location: [place] [facility];  Service: Cardiovascular;;    Family History  Problem Relation Age of Onset   Hypertension Mother    Diabetes Mother    Heart disease Mother    Skin cancer Mother    Hypertension Father    Diabetes Father    Diabetes Sister     Social History   Socioeconomic History   Marital status: Divorced    Spouse name: Not on file   Number of children: Not on file   Years of education: Not on file   Highest education level: Not on file  Occupational History   Not on file  Social Needs   Financial resource strain: Not on file   Food insecurity    Worry: Not on file    Inability: Not on file   Transportation needs    Medical: Not on file    Non-medical: Not on file  Tobacco Use   Smoking status: Former Smoker    Packs/day: 2.00    Years: 40.00    Pack years: 80.00    Types: Cigarettes    Quit date: 03/19/2011    Years since quitting: 7.5   Smokeless tobacco: Never Used  Substance and Sexual Activity   Alcohol use: No    Alcohol/week: 0.0 standard drinks   Drug use: No    Comment: 09/08/2014 "I have used marijuana till the  1990's". Notes that he quit 15 yrs ago   Sexual activity: Not Currently  Lifestyle   Physical activity    Days per week: Not on file    Minutes per session: Not on file   Stress: Not on file  Relationships   Social connections    Talks on phone: Not on file    Gets together: Not on file    Attends religious service: Not on file    Active member of club or organization: Not on file    Attends meetings of clubs or organizations: Not on file    Relationship status: Not on file   Intimate partner violence    Fear of current or ex partner: Not on file    Emotionally abused: Not on file    Physically abused: Not on file    Forced sexual activity: Not on file  Other Topics Concern   Not on file  Social History Narrative   Pt lives alone in a 1 story home   Has 3 adult daughters   Highest level of education: associates degree   Worked in Runner, broadcasting/film/video.     Current Outpatient Medications  Medication Sig Dispense Refill   acetaminophen (TYLENOL) 500 MG tablet Take 500-1,000 mg by mouth daily as needed (pain).      aspirin EC 81 MG tablet Take 1 tablet (81 mg total) by mouth daily. 30 tablet 11   atorvastatin (LIPITOR) 80 MG tablet Take 1 tablet (80 mg total) by mouth daily with supper. 30 tablet 0   B Complex-C-Folic Acid (RENA-VITE RX) 1 MG TABS Take 1 tablet by mouth daily.     cetirizine (ZYRTEC) 10 MG tablet Take 10 mg by mouth daily.     clopidogrel (PLAVIX) 75 MG tablet Take 1 tablet (75 mg total) by mouth daily with supper. 30 tablet 0   gabapentin (NEURONTIN) 300 MG capsule Take 1 capsule (300 mg total) by mouth every morning. (Patient taking differently: Take 300-600 mg by mouth See admin instructions. 300 mg in the morning, and 600 mg twice daily, lunch and dinner) 30 capsule 0   LORazepam (ATIVAN) 1 MG tablet Take 1 mg by mouth 2 (two) times daily as needed for anxiety.     metoprolol tartrate (LOPRESSOR) 50 MG tablet Take 1 tablet (50 mg total) by  mouth as directed. 1 tablet 0   midodrine (PROAMATINE) 10 MG tablet Take 1 tablet (10 mg total) by mouth every Monday, Wednesday, and Friday with hemodialysis. 30 tablet 0   multivitamin (RENA-VIT) TABS tablet Take 1 tablet by mouth at bedtime. 30 tablet 0   mupirocin ointment (BACTROBAN) 2 % Apply 1 application topically daily.     oxyCODONE (OXY IR/ROXICODONE) 5 MG immediate release tablet Take 1-2 tablets (5-10 mg total) by mouth every 4 (four) hours as needed for moderate pain. 30 tablet 0   pantoprazole (PROTONIX) 40 MG tablet Take 1 tablet (40 mg total) by mouth daily. 30 tablet 0   sucroferric oxyhydroxide (VELPHORO) 500 MG chewable tablet Chew 2 tablets (1,000 mg total) by mouth 3 (three) times daily with meals. 90 tablet 0   vitamin B-12 (CYANOCOBALAMIN) 1000 MCG tablet Take 1,000 mcg by mouth daily.     lidocaine (LIDODERM) 5 % Place 1 patch onto the skin daily. Remove & Discard patch within 12 hours or as directed by MD (Patient not taking: Reported on 10/14/2018) 30 patch 0   No current facility-administered medications for this visit.     No Known Allergies    Review of Systems:   General:  normal appetite, decreased energy, no weight gain, no weight loss, no fever  Cardiac:  occasional chest pain with exertion, no chest pain at rest, +SOB with low level exertion, occasional resting SOB, no PND, no orthopnea, no palpitations, no arrhythmia, no atrial fibrillation, + LE edema, no dizzy spells, no syncope  Respiratory:  + exertional shortness of breath, no home oxygen, no productive cough, no dry cough, no bronchitis, no wheezing, no hemoptysis, no asthma, no pain with inspiration or cough, no sleep apnea, no CPAP at night  GI:   no difficulty swallowing, no reflux, no frequent heartburn, no hiatal hernia, no abdominal pain, no constipation, no diarrhea, no hematochezia, no hematemesis, no melena  GU:   no dysuria, essentially anuric frequency, no urinary tract infection, no  hematuria, no enlarged prostate, no kidney stones, + chronic kidney disease  Vascular:  no  pain suggestive of claudication, + pain in feet, no leg cramps, no varicose veins, no DVT, no current non-healing foot ulcer  Neuro:   no stroke, no TIA's, no seizures, no headaches, no temporary blindness one eye,  no slurred speech, + peripheral neuropathy, + chronic pain, + instability of gait, no memory/cognitive dysfunction  Musculoskeletal: no arthritis, no joint swelling, no myalgias, some difficulty walking, limited mobility   Skin:   no rash, no itching, no skin infections, no pressure sores or ulcerations  Psych:   + anxiety, no depression, no nervousness, no unusual recent stress  Eyes:   no blurry vision, + floaters, no recent vision changes, + wears glasses for reading  ENT:   no hearing loss, no loose or painful teeth, edentulous with dentures which are "broken"  Hematologic:  no easy bruising, no abnormal bleeding, no clotting disorder, no frequent epistaxis  Endocrine:  no diabetes, does not check CBG's at home           Physical Exam:   BP 110/68 (BP Location: Left Arm, Patient Position: Sitting, Cuff Size: Normal)    Pulse 93    Temp 97.7 F (36.5 C) Comment: thermal   Resp 16    Ht _0  (1.778 m)    Wt 186 lb (84.4 kg)    SpO2 92% Comment: RA   BMI 26.69 kg/m   General:  Chronically ill-appearing  HEENT:  Unremarkable   Neck:   no JVD, no bruits, no adenopathy   Chest:   clear to auscultation, symmetrical breath sounds, no wheezes, no rhonchi   CV:   RRR, grade III/VI crescendo/decrescendo murmur heard best at RSB,  no diastolic murmur  Abdomen:  soft, non-tender, no masses   Extremities:  warm, well-perfused, pulses palpable both groins, large left groin seroma with no signs of infection, 3+ bilateral LE edema  Rectal/GU  Deferred  Neuro:   Grossly non-focal and symmetrical throughout  Skin:   Clean and dry, no rashes, no breakdown   Diagnostic Tests:    ECHOCARDIOGRAM  REPORT       Patient Name:   YONAEL TULLOCH Date of Exam: 07/25/2018 Medical Rec #:  [id number]      Height:       70.0 in Accession #:    [id number]     Weight:       196.0 lb Date of Birth:  29-Jul-1950      BSA:          2.07 m Patient Age:    35 years       BP:           93/62 mmHg Patient Gender: M              HR:           92 bpm. Exam Location:  Inpatient    Procedure: 2D Echo, Cardiac Doppler and Color Doppler  Indications:    Aoritc stenosis   History:        Patient has prior history of Echocardiogram examinations, most                 recent 03/24/2018. CAD PVD Aortic Valve Disease. ESRD.   Sonographer:    Dustin Flock Referring Phys: [place]    1. The left ventricle has moderately reduced systolic function, with an ejection fraction of 35-40%. The cavity size was normal. There is moderately increased left ventricular wall thickness. Left ventricular diastolic Doppler parameters  are consistent  with impaired relaxation. Elevated left atrial and left ventricular end-diastolic pressures The E/e' is >15.  2. Moderate akinesis of the left ventricular, entire inferior wall.  3. The right ventricle has normal systolic function. The cavity was normal. There is no increase in right ventricular wall thickness.  4. The mitral valve is abnormal. Mild thickening of the mitral valve leaflet. There is mild mitral annular calcification present.  5. The tricuspid valve is grossly normal.  6. The aortic valve is tricuspid. Moderate calcification of the aortic valve. Aortic valve regurgitation is mild by color flow Doppler. Moderate-severe stenosis of the aortic valve.  7. The inferior vena cava was dilated in size with <50% respiratory variability.  8. The interatrial septum was not assessed.  9. When compared to the prior study: 03/24/2018: LVEF 40-45%, moderate aortic stenosis - mean gradient 30 mmHg.  SUMMARY   LVEF 35-40%, severe inferior  hypokinesis to akinesis, moderate LVH, grade 1 DD, elevated LV filling pressure, MAC with mild MR, calcified aortic valve with moderate to severe aortic stenosis (mean gradient 30 mmHg) - calculated AVA around 0.9 cm2, dilated IVC  FINDINGS  Left Ventricle: The left ventricle has moderately reduced systolic function, with an ejection fraction of 35-40%. The cavity size was normal. There is moderately increased left ventricular wall thickness. Left ventricular diastolic Doppler parameters  are consistent with impaired relaxation. Elevated left atrial and left ventricular end-diastolic pressures The E/e' is >15. Moderate akinesis of the left ventricular, entire inferior wall.  Right Ventricle: The right ventricle has normal systolic function. The cavity was normal. There is no increase in right ventricular wall thickness.  Left Atrium: Left atrial size was normal in size.  Right Atrium: Right atrial size was normal in size. Right atrial pressure is estimated at 3 mmHg.  Interatrial Septum: The interatrial septum was not assessed.  Pericardium: There is no evidence of pericardial effusion.  Mitral Valve: The mitral valve is abnormal. Mild thickening of the mitral valve leaflet. There is mild mitral annular calcification present. Mitral valve regurgitation is trivial by color flow Doppler.  Tricuspid Valve: The tricuspid valve is grossly normal. Tricuspid valve regurgitation is trivial by color flow Doppler.  Aortic Valve: The aortic valve is tricuspid Moderate calcification of the aortic valve. Aortic valve regurgitation is mild by color flow Doppler. There is Moderate-severe stenosis of the aortic valve, with a calculated valve area of 0.76 cm.  Pulmonic Valve: The pulmonic valve was grossly normal. Pulmonic valve regurgitation is trivial by color flow Doppler.  Venous: The inferior vena cava measures 2.14 cm, is dilated in size with less than 50% respiratory  variability.  Compared to previous exam: 03/24/2018: LVEF 40-45%, moderate aortic stenosis - mean gradient 30 mmHg.    +--------------+--------++  LEFT VENTRICLE                 +----------------+---------++ +--------------+--------++       Diastology                    PLAX 2D                        +----------------+---------++ +--------------+--------++       LV e' lateral:   5.66 cm/s    LVIDd:         4.58 cm         +----------------+---------++ +--------------+--------++       LV E/e' lateral: 20.7  LVIDs:         3.80 cm         +----------------+---------++ +--------------+--------++       LV e' medial:    6.64 cm/s    LV PW:         1.36 cm         +----------------+---------++ +--------------+--------++       LV E/e' medial:  17.6         LV IVS:        1.46 cm         +----------------+---------++ +--------------+--------++  LVOT diam:     2.20 cm    +--------------+--------++  LV SV:         34 ml      +--------------+--------++  LV SV Index:   16.28      +--------------+--------++  LVOT Area:     3.80 cm   +--------------+--------++                            +--------------+--------++   +------------------+---------++  LV Volumes (MOD)               +------------------+---------++  LV area d, A4C:    50.90 cm   +------------------+---------++  LV area s, A4C:    37.60 cm   +------------------+---------++  LV major d, A4C:   10.30 cm    +------------------+---------++  LV major s, A4C:   9.62 cm     +------------------+---------++  LV vol d, MOD A4C: 209.0 ml    +------------------+---------++  LV vol s, MOD A4C: 124.0 ml    +------------------+---------++  LV SV MOD A4C:     209.0 ml    +------------------+---------++  +---------------+---------++  RIGHT VENTRICLE             +---------------+---------++  RV Basal diam:  3.49 cm     +---------------+---------++  RV S prime:     8.70 cm/s   +---------------+---------++  TAPSE  (M-mode): 1.7 cm      +---------------+---------++  +---------------+-------++-----------++  LEFT ATRIUM              Index         +---------------+-------++-----------++  LA diam:        3.90 cm  1.88 cm/m    +---------------+-------++-----------++  LA Vol (A2C):   76.4 ml  36.92 ml/m   +---------------+-------++-----------++  LA Vol (A4C):   62.9 ml  30.39 ml/m   +---------------+-------++-----------++  LA Biplane Vol: 70.4 ml  34.02 ml/m   +---------------+-------++-----------++ +------------+---------++-----------++  RIGHT ATRIUM            Index         +------------+---------++-----------++  RA Area:     17.30 cm                +------------+---------++-----------++  RA Volume:   46.10 ml   22.28 ml/m   +------------+---------++-----------++  +------------------+------------++  AORTIC VALVE                      +------------------+------------++  AV Area (Vmax):    0.87 cm       +------------------+------------++  AV Area (Vmean):   0.82 cm       +------------------+------------++  AV Area (VTI):     0.76 cm       +------------------+------------++  AV Vmax:           367.67 cm/s    +------------------+------------++  AV Vmean:          243.000 cm/s   +------------------+------------++  AV VTI:            0.804 m        +------------------+------------++  AV Peak Grad:      54.1 mmHg      +------------------+------------++  AV Mean Grad:      30.0 mmHg      +------------------+------------++  LVOT Vmax:         84.20 cm/s     +------------------+------------++  LVOT Vmean:        52.300 cm/s    +------------------+------------++  LVOT VTI:          0.160 m        +------------------+------------++  LVOT/AV VTI ratio: 0.20           +------------------+------------++  AR PHT:            228 msec       +------------------+------------++   +-------------+-------++  AORTA                   +-------------+-------++  Ao Root diam: 3.20  cm   +-------------+-------++  +--------------+----------++  +---------------+-----------++  MITRAL VALVE                  TRICUSPID VALVE               +--------------+----------++  +---------------+-----------++  MV Area (PHT): 6.83 cm       TR Peak grad:   15.1 mmHg     +--------------+----------++  +---------------+-----------++  MV PHT:        32.19 msec     TR Vmax:        197.00 cm/s   +--------------+----------++  +---------------+-----------++  MV Decel Time: 111 msec     +--------------+----------++  +--------------+-------+ +--------------+-----------++  SHUNTS                   MV E velocity: 117.00 cm/s   +--------------+-------+ +--------------+-----------++  Systemic VTI:  0.16 m    MV A velocity: 126.00 cm/s   +--------------+-------+ +--------------+-----------++  Systemic Diam: 2.20 cm   MV E/A ratio:  0.93          +--------------+-------+ +--------------+-----------++  +---------+-------+  IVC                +---------+-------+  IVC diam: 2.14 cm  +---------+-------+    Lyman Bishop MD Electronically signed by Lyman Bishop MD Signature Date/Time: 07/25/2018/4:44:35 PM    LEFT HEART CATH AND CORONARY ANGIOGRAPHY     Conclusion 1. Severe single vessel CAD with chronic total occlusion of the RCA, collateralized with L--->R collaterals  2. Mild calcific nonobstructive LCA disease, anatomy unchanged from previous cath studies  3. At least moderate AS, possibly severe LFLG - will check echo  Recommend: medical therapy, no target for PCI     Recommendations Antiplatelet/Anticoag Recommend uninterrupted dual antiplatelet therapy with Aspirin 48m daily and Clopidogrel 747mdaily for a minimum of 12 months (ACS-Class I recommendation).     Surgeon Notes   07/22/2018 10:19 AM Op Note signed by CaWaynetta SandyMD     Indications Non-ST elevation (NSTEMI) myocardial infarction ([place][I21.4 (ICD-10-CM)]     Procedural Details Technical  Details INDICATION: NSTEMI. 89o male with ESRD, known CAD with CTO of the RCA, and recent fem-pop bypass who had post-op NSTEMI with troponin >65, referred for cath to evaluate coronary anatomy in light of large  MI by enzyme trend.   PROCEDURAL DETAILS:  The right groin is prepped, draped, and anesthetized with 1% lidocaine. Using US guidance, a 5 French sheath is introduced into the right femoral artery. US demonstrates diffuse CFA disease, and the access site is chosen high on the femoral head as this was the cleanest portion of the right CFA.[place] images are captured and stored in the patient's chart. Standard Judkins catheters are used for coronary angiography. The RCA is not injected as it is known to be occluded at the ostium. The aortic valve is difficult to cross. An AL-2 catheter is used and a j-wire is directed across the valve. LV pressure is recorded and an aortic valve pullback gradient is recorded. Left ventriculography is performed. Catheter exchanges are performed over an 0.035 guidewire. There are no immediate procedural complications. The patient is transferred to the post catheterization recovery area for further monitoring.    Estimated blood loss <50 mL.   During this procedure medications were administered to achieve and maintain moderate conscious sedation while the patient's heart rate, blood pressure, and oxygen saturation were continuously monitored and I was present face-to-face 100% of this time.     Medications (Filter: Administrations occurring from 07/24/18 1054 to 07/24/18 1211)  Continuous medications are totaled by the amount administered until 07/24/18 1211.   Medication Rate/Dose/Volume Action   Date Time    0.9 % sodium chloride infusion (mL/hr) 10 mL/hr New Bag/Given 07/24/18 1119   Dosing weight: 88.9 kg 999 mL/hr Rate/Dose Change 1158   Total dose as of 07/24/18 1211        Cannot be calculated        fentaNYL (SUBLIMAZE) injection (mcg) 25 mcg Given  07/24/18 1131   Total dose as of 07/24/18 1211        25 mcg        midazolam (VERSED) injection (mg) 1 mg Given 07/24/18 1131   Total dose as of 07/24/18 1211        1 mg        lidocaine (PF) (XYLOCAINE) 1 % injection (mL) 15 mL Given 07/24/18 1135   Total dose as of 07/24/18 1211        15 mL        iohexol (OMNIPAQUE) 350 MG/ML injection (mL) 90 mL Given 07/24/18 1200   Total dose as of 07/24/18 1211        90 mL        patiromer (VELTASSA) packet 16.8 g (g) *Not included in total [facility] Hold 07/24/18 1054   Dosing weight: 88.9 kg        Total dose as of 07/24/18 1211        Cannot be calculated        0.9 % sodium chloride infusion (Manually program via Guardrails IV Fluids) *Not included in total [facility] Hold 07/24/18 1054   Dosing weight: 89.3 kg        Total dose as of 07/24/18 1211        Cannot be calculated        0.9 % sodium chloride infusion (mL) *Not included in total [facility] Hold 07/24/18 1054   Dosing weight: 86.2 kg        Total dose as of 07/24/18 1211        Cannot be calculated        aspirin EC tablet 81 mg (mg) *Not included in total [facility] Hold 07/24/18 1054   Dosing weight: 86.2 kg  Total dose as of 07/24/18 1211        Cannot be calculated        atorvastatin (LIPITOR) tablet 80 mg (mg) *Not included in total [facility] Hold 07/24/18 1054   Dosing weight: 86.2 kg        Total dose as of 07/24/18 1211        Cannot be calculated        bisacodyl (DULCOLAX) suppository 10 mg (mg) *Not included in total [facility] Hold 07/24/18 1054   Dosing weight: 86.2 kg        Total dose as of 07/24/18 1211        Cannot be calculated        clopidogrel (PLAVIX) tablet 75 mg (mg) *Not included in total [facility] Hold 07/24/18 1054   Dosing weight: 86.2 kg        Total dose as of 07/24/18 1211        Cannot be calculated        Darbepoetin Alfa (ARANESP) injection 60 mcg (mcg) *Not included in total [facility] Hold 07/24/18 1054   Dosing weight: 86.2 kg        Total dose as of 07/24/18 1211         Cannot be calculated        docusate sodium (COLACE) capsule 100 mg (mg) *Not included in total [facility] Hold 07/24/18 1054   Dosing weight: 86.2 kg        Total dose as of 07/24/18 1211        Cannot be calculated        ferric gluconate (NULECIT) 62.5 mg in sodium chloride 0.9 % 100 mL IVPB (mL/hr) *Not included in total [facility] Hold 07/24/18 1054   Dosing weight: 86.2 kg        Total dose as of 07/24/18 1211        Cannot be calculated        gabapentin (NEURONTIN) capsule 300 mg (mg) *Not included in total [facility] Hold 07/24/18 1054   Total dose as of 07/24/18 1211        Cannot be calculated        guaiFENesin-dextromethorphan (ROBITUSSIN DM) 100-10 MG/5ML syrup 15 mL (mL) *Not included in total [facility] Hold 07/24/18 1054   Dosing weight: 86.2 kg        Total dose as of 07/24/18 1211        Cannot be calculated        heparin ADULT infusion 100 units/mL (25000 units/260m sodium chloride 0.45%) (Units/hr)  Stopped 07/24/18 1056   Dosing weight: 88.9 kg        Total dose as of 07/24/18 1211        Cannot be calculated        hydrALAZINE (APRESOLINE) injection 5 mg (mg) *[facility] 07/[facility] 1054   Dosing weight: 86.2 kg        Total dose as of 07/24/18 1211        Cannot be calculated        labetalol (NORMODYNE) injection 10 mg (mg) *[facility]   Dosing weight: 86.2 kg        Total dose as of 07/24/18 1211        Cannot be calculated        loratadine (CLARITIN) tablet 10 mg (mg) *Not included in total [facility] Hold 07/24/18 1054   Dosing weight: 86.2 kg  Total dose as of 07/24/18 1211        Cannot be calculated        magnesium sulfate IVPB 2 g 50 mL (g) *Not included in total [facility] Hold 07/24/18 1054   Dosing weight: 86.2 kg        Total dose as of 07/24/18 1211        Cannot be calculated        metoprolol tartrate (LOPRESSOR) injection 2-5 mg (mg) *Not included in total [facility] Hold 07/24/18 1054   Dosing weight: 86.2 kg        Total  dose as of 07/24/18 1211        Cannot be calculated        ondansetron (ZOFRAN) injection 4 mg (mg) *Not included in total [facility] Hold 07/24/18 1054   Dosing weight: 86.2 kg        Total dose as of 07/24/18 1211        Cannot be calculated        oxyCODONE (Oxy IR/ROXICODONE) immediate release tablet 5-10 mg (mg) *Not included in total [facility] Hold 07/24/18 1054   Dosing weight: 86.2 kg        Total dose as of 07/24/18 1211        Cannot be calculated        oxyCODONE (OXYCONTIN) 12 hr tablet 10 mg (mg) *Not included in total [facility] Hold 07/24/18 1054   Dosing weight: 86.2 kg        Total dose as of 07/24/18 1211        Cannot be calculated        pantoprazole (PROTONIX) EC tablet 40 mg (mg) *Not included in total [facility] Hold 07/24/18 1054   Dosing weight: 86.2 kg        Total dose as of 07/24/18 1211        Cannot be calculated        phenol (CHLORASEPTIC) mouth spray 1 spray (spray) *Not included in total [facility] Hold 07/24/18 1054   Dosing weight: 86.2 kg        Total dose as of 07/24/18 1211        Cannot be calculated        polyethylene glycol (MIRALAX / GLYCOLAX) packet 17 g (g) *Not included in total [facility] Hold 07/24/18 1054   Dosing weight: 86.2 kg        Total dose as of 07/24/18 1211        Cannot be calculated        sodium chloride flush (NS) 0.9 % injection 3 mL (mL) *Not included in total [facility] Hold 07/24/18 1054   Dosing weight: 88.9 kg        Total dose as of 07/24/18 1211        Cannot be calculated        sucroferric oxyhydroxide (VELPHORO) chewable tablet 1,000 mg (mg) *Not included in total [facility] Hold 07/24/18 1054   Total dose as of 07/24/18 1211 *Not included in total [employer] Held 1200   Cannot be calculated        sucroferric oxyhydroxide (VELPHORO) chewable tablet 500 mg (mg) *Not included in total [facility] Hold 07/24/18 1054   Dosing weight: 86.2 kg        Total dose as of 07/24/18 1211        Cannot be calculated        vitamin B-12 (CYANOCOBALAMIN) tablet 1,000 mcg (mcg)  *Not included in total [facility] Hold  07/24/18 1054   Dosing weight: 86.2 kg        Total dose as of 07/24/18 1211        Cannot be calculated           Sedation Time Sedation Time Physician-1: 25 minutes 58 seconds           Coronary Findings Diagnostic Dominance: Right  Left Main  Vessel is normal in caliber. Vessel is angiographically normal.   Left Anterior Descending  The proximal vessel has an eccentric 40% stenosis unchanged from past cath studies. The vessel is diffusely calcified.  Prox LAD lesion 40% stenosed  Prox LAD lesion is 40% stenosed. The lesion is moderately calcified.   Ramus Intermedius  Large branch with no high-grade stenosis  Ramus lesion 40% stenosed  Ramus lesion is 40% stenosed.   Left Circumflex  Ost Cx to Prox Cx lesion 65% stenosed  Ost Cx to Prox Cx lesion is 65% stenosed.   Right Coronary Artery  Ost RCA to Mid RCA lesion 100% stenosed  Ost RCA to Mid RCA lesion is 100% stenosed.   Right Posterior Atrioventricular Artery  Collaterals  RPAV filled by collaterals from 2nd Sept.    Intervention No interventions have been documented.                      Wall Motion Resting                    Left Heart Left Ventricle There is severe left ventricular systolic dysfunction. LV end diastolic pressure is mildly elevated. There are LV function abnormalities due to segmental dysfunction. LVEF estimated 35%     Coronary Diagrams Diagnostic Dominance: Right  Intervention      Implants  No implant documentation for this case.      Syngo Images Link to Procedure Log  Show images for CARDIAC CATHETERIZATION Procedure Log     Images on Long Term Storage   Show images for Nivan, Melendrez         [facility] Data   Most Recent Value  Aortic Mean Gradient 27.67 mmHg  Aortic Peak Gradient 35 mmHg  AO Systolic Pressure 68 mmHg  AO Diastolic Pressure 41 mmHg  AO Mean 52 mmHg  LV Systolic Pressure 353 mmHg  LV Diastolic  Pressure 9 mmHg  LV EDP 17 mmHg  AOp Systolic Pressure 75 mmHg  AOp Diastolic Pressure 42 mmHg  AOp Mean Pressure 54 mmHg  LVp Systolic Pressure 299 mmHg  LVp Diastolic Pressure 10 mmHg  LVp EDP Pressure 23 mmHg     Cardiac TAVR CT  TECHNIQUE: The patient was scanned on a [employer]. A 120 kV retrospective scan was triggered in the descending thoracic aorta at 111 HU's. Gantry rotation speed was 250 msecs and collimation was .6 mm. No beta blockade or nitro were given. The 3D data set was reconstructed in 5% intervals of the R-R cycle. Systolic and diastolic phases were analyzed on a dedicated work station using MPR, MIP and VRT modes. The patient received 80 cc of contrast.  FINDINGS: Aortic Valve: Trileaflet aortic valve with severely thickened and calcified leaflets with severely restricted leaflet opening and no calcifications extending into the LVOT.  Aorta: Normal size, mild diffuse atherosclerotic plaque and calcifications and no dissection.  Sinotubular Junction: 31 x 29 mm  Ascending Thoracic Aorta: 31 x 30 mm  Aortic Arch: 28 x 26 mm  Descending Thoracic Aorta: 23 x 22  mm  Sinus of Valsalva Measurements:  Non-coronary: 34 mm  Right -coronary: 33 mm  Left -coronary: 35 mm  Coronary Artery Height above Annulus:  Left Main: 12 mm  Right Coronary: 19 mm (RCA occluded)  Virtual Basal Annulus Measurements:  Maximum/Minimum Diameter: 28.6 x 24.1 mm  Mean Diameter: 25.4 mm  Perimeter: 81.5 mm  Area: 508 mm2  Optimum Fluoroscopic Angle for Delivery: LAO 6 CAU 7.  IMPRESSION: 1. Trileaflet aortic valve with severely thickened and calcified leaflets with severely restricted leaflet opening and no calcifications extending into the LVOT. Aortic valve calcium score 2859 consistent with severe aortic stenosis. Annular measurements suitable for delivery of a 26 mm Edwards-SAPIEN 3 valve.  2. Sufficient coronary to  annulus distance.  3. Optimum Fluoroscopic Angle for Delivery:  LAO 6 CAU 7.  4. No thrombus in the left atrial appendage.  Electronically Signed: By: Ena Dawley On: 09/30/2018 12:10    CT ANGIOGRAPHY CHEST, ABDOMEN AND PELVIS  TECHNIQUE: Multidetector CT imaging through the chest, abdomen and pelvis was performed using the standard protocol during bolus administration of intravenous contrast. Multiplanar reconstructed images and MIPs were obtained and reviewed to evaluate the vascular anatomy.  CONTRAST:  159m OMNIPAQUE IOHEXOL 350 MG/ML SOLN  COMPARISON:  PET-CT 10/14/2014. CT the abdomen and pelvis 09/08/2014.  FINDINGS: CTA CHEST FINDINGS  Cardiovascular: Heart size is enlarged with moderate left ventricular dilatation. There is no significant pericardial fluid, thickening or pericardial calcification. There is aortic atherosclerosis, as well as atherosclerosis of the great vessels of the mediastinum and the coronary arteries, including calcified atherosclerotic plaque in the left main, left anterior descending, left circumflex and right coronary arteries. Severe thickening calcification of the aortic valve. Mild calcifications of the mitral annulus.  Mediastinum/Lymph Nodes: No pathologically enlarged mediastinal or hilar lymph nodes. Esophagus is unremarkable in appearance. No axillary lymphadenopathy.  Lungs/Pleura: No suspicious appearing pulmonary nodules or masses are noted. No acute consolidative airspace disease. Moderate bilateral pleural effusions with some mild dependent subsegmental atelectasis. Mild diffuse bronchial wall thickening with mild centrilobular and paraseptal emphysema.  Musculoskeletal/Soft Tissues: There are no aggressive appearing lytic or blastic lesions noted in the visualized portions of the skeleton.  CTA ABDOMEN AND PELVIS FINDINGS  Hepatobiliary: 2.3 cm low-attenuation lesion in segment 7 of the liver,  increased in size compared to remote prior study from 2016, compatible with a slow early growing simple cyst. No other suspicious cystic or solid hepatic lesions. No intra or extrahepatic biliary ductal dilatation. Status post cholecystectomy.  Pancreas: No pancreatic mass. No pancreatic ductal dilatation. No pancreatic or peripancreatic fluid or inflammatory changes.  Spleen: Unremarkable.  Adrenals/Urinary Tract: Moderate bilateral renal atrophy. Numerous subcentimeter low-attenuation lesions in the kidneys bilaterally, too small to characterize, but statistically likely to represent tiny cysts. Bilateral adrenal glands are normal in appearance. No hydroureteronephrosis. Urinary bladder is normal in appearance.  Stomach/Bowel: Normal appearance of the stomach. No pathologic dilatation of small bowel or colon. Numerous colonic diverticulae are noted, particularly in the sigmoid colon, without surrounding inflammatory changes to suggest an acute diverticulitis at this time. Normal appendix.  Vascular/Lymphatic: Aortic atherosclerosis with vascular findings and measurements pertinent to potential TAVR procedure, as detailed below. Extending from the medial aspect of the left common femoral artery distally, immediately adjacent to the bifurcation, there is a 1.7 x 0.8 x 0.7 cm focal outpouching (axial image 199 of series 16 and coronal image 51 of series 18), compatible with a pseudoaneurysm. Anterior to the superficial femoral artery (axial image  204 of series 16) there is a large 5.6 x 7.6 x 8.1 cm low-intermediate attenuation lesion, likely to represent a resolving hematoma. Multiple prominent borderline enlarged retroperitoneal lymph nodes, nonspecific.  Reproductive: Prostate gland and seminal vesicles are unremarkable in appearance.  Other: No significant volume of ascites.  No pneumoperitoneum.  Musculoskeletal: There are no aggressive appearing lytic or  blastic lesions noted in the visualized portions of the skeleton.  VASCULAR MEASUREMENTS PERTINENT TO TAVR:  AORTA:  Minimal Aortic Diameter-11 x 14 mm  Severity of Aortic Calcification-severe  RIGHT PELVIS:  Right Common Iliac Artery -  Minimal Diameter-6.6 x 5.2 mm  Tortuosity - mild  Calcification-severe  Right External Iliac Artery -  Minimal Diameter-5.4 x 5.9 mm  Tortuosity - mild  Calcification-moderate  Right Common Femoral Artery -  Minimal Diameter-6.1 x 5.3 mm  Tortuosity - mild  Calcification-moderate  LEFT PELVIS:  Left Common Iliac Artery -  Minimal Diameter-7.0 x 7.0 mm  Tortuosity - mild  Calcification-severe  Left External Iliac Artery -  Minimal Diameter-7.5 x 6.7 mm  Tortuosity - mild  Calcification-mild  Left Common Femoral Artery -  Minimal Diameter-8.3 x 6.6 mm  Tortuosity-mild  Calcification-mild-to-moderate  Review of the MIP images confirms the above findings.  IMPRESSION: 1. Vascular findings and measurements pertinent to potential TAVR procedure, as detailed above. 2. Large low-intermediate attenuation fluid collection in the left inguinal region likely to represent a resolving hematoma. There is also a small pseudoaneurysm extending off the medial aspect of the distal left common femoral artery, as above. 3. Severe thickening calcification of the aortic valve, compatible with the reported clinical history of severe aortic stenosis. 4. Cardiomegaly with moderate left ventricular dilatation. 5. Aortic atherosclerosis, in addition to left main and 3 vessel coronary artery disease. Please note that although the presence of coronary artery calcium documents the presence of coronary artery disease, the severity of this disease and any potential stenosis cannot be assessed on this non-gated CT examination. Assessment for potential risk factor modification, dietary therapy or  pharmacologic therapy may be warranted, if clinically indicated. 6. Moderate bilateral renal atrophy. 7. Colonic diverticulosis without evidence of acute diverticulitis at this time. 8. Additional incidental findings, as above.   Electronically Signed   By: Vinnie Langton M.D.   On: 09/30/2018 14:13    EKG: NSR w/ baseline RBBB    STS Risk Calculator  Procedure: AVR + CAB   Risk of Mortality: 4.533%  Renal Failure: NA  Permanent Stroke: 1.195%  Prolonged Ventilation: 15.778%  DSW Infection: 0.308%  Reoperation: 6.871%  Morbidity or Mortality: 25.513%  Short Length of Stay: 22.924%  Long Length of Stay: 11.304%     Impression:  Patient has stage D severe symptomatic low gradient low ejection fraction aortic stenosis and single-vessel coronary artery disease with chronic occlusion of the right coronary artery.  He is experiencing chronic symptoms of severe exertional shortness of breath and fluid retention with bilateral lower extremity edema consistent with chronic combined systolic and diastolic congestive heart failure, [place] Heart Association functional class IIIb.  He is also having problems with dialysis treatments which have been complicated by hypotension.    I have personally reviewed the patient's most recent transthoracic echocardiogram, diagnostic cardiac catheterization, and CT angiograms.  Echocardiogram reveals at least moderate left ventricular systolic dysfunction with ejection fraction estimated only 35 to 40%.  The patient's left ventricular function has decreased substantially since last October.  In addition, there is significant diastolic dysfunction and findings possibly  consistent with cardiac amyloidosis in this patient with known amyloidosis with renal involvement.  The aortic valve is trileaflet with severe thickening, calcification, and restricted leaflet mobility involving all 3 leaflets.  Peak velocity across aortic valve on most recent  echocardiogram measured 3.7 m/s corresponding to mean transvalvular gradient estimated 30 mmHg but the DVI was notably quite low at 0.20.  Diagnostic cardiac catheterization reveals chronic occlusion the right coronary artery with left-to-right collateral filling of the terminal branches.  There is mild nonobstructive disease in the left coronary circulation.  I agree the patient would likely benefit from aortic valve replacement.  Risks associated with conventional surgery with or without concomitant coronary artery bypass grafting would unquestionably be very high in this patient with severe comorbid medical problems and unclear life expectancy.  Cardiac-gated CTA of the heart reveals anatomical characteristics consistent with aortic stenosis suitable for treatment by transcatheter aortic valve replacement without any significant complicating features.  CTA of the aorta and iliac vessels demonstrate what may be adequate pelvic vascular access to facilitate a transfemoral approach on the right side, although there is some severe focal disease in the proximal right common iliac artery.  The left femoral artery should probably not be accessed because of the patient's recent surgery and relatively large residual seroma in the left groin.  The patient's dialysis access is notably in the right arm.  The left subclavian artery is notably quite large and could be used for alternative access.  Left common carotid artery is slightly small in caliber but probably could be used as well if alternative access when necessary.  EKG reveals baseline right bundle branch block, placing the patient at somewhat increased risk for permanent pacemaker following TAVR.   Plan:  The patient was counseled at length regarding treatment alternatives for management of severe symptomatic aortic stenosis. Alternative approaches such as conventional aortic valve replacement, transcatheter aortic valve replacement, and continued medical  therapy without intervention were compared and contrasted at length.  The risks associated with conventional surgical aortic valve replacement were discussed in detail, as were expectations for post-operative convalescence, and why I would be reluctant to consider this patient a candidate for conventional surgery.  Issues specific to transcatheter aortic valve replacement were discussed including questions about long term valve durability, the potential for paravalvular leak, possible increased risk of need for permanent pacemaker placement, and other technical complications related to the procedure itself.  Long-term prognosis with medical therapy was discussed. This discussion was placed in the context of the patient's own specific clinical presentation and past medical history.  All of their questions have been addressed.  The patient desires to proceed with transcatheter aortic valve replacement as soon as practical.  We tentatively plan for surgery on October 21, 2018.  Following the decision to proceed with transcatheter aortic valve replacement, a discussion has been held regarding what types of management strategies would be attempted intraoperatively in the event of life-threatening complications, including whether or not the patient would be considered a candidate for the use of cardiopulmonary bypass and/or conversion to open sternotomy for attempted surgical intervention.  The patient has been advised of a variety of complications that might develop including but not limited to risks of death, stroke, paravalvular leak, aortic dissection or other major vascular complications, aortic annulus rupture, device embolization, cardiac rupture or perforation, mitral regurgitation, acute myocardial infarction, arrhythmia, heart block or bradycardia requiring permanent pacemaker placement, congestive heart failure, respiratory failure, renal failure, pneumonia, infection, other late complications related to  structural valve deterioration or migration, or other complications that might ultimately cause a temporary or permanent loss of functional independence or other long term morbidity.  The patient provides full informed consent for the procedure as described and all questions were answered.    I spent in excess of 90 minutes during the conduct of this office consultation and >50% of this time involved direct face-to-face encounter with the patient for counseling and/or coordination of their care.    Valentina Gu. Roxy Manns, MD 10/14/2018 5:17 PM

## 2018-10-15 ENCOUNTER — Encounter: Payer: Self-pay | Admitting: Thoracic Surgery (Cardiothoracic Vascular Surgery)

## 2018-10-15 ENCOUNTER — Other Ambulatory Visit: Payer: Self-pay

## 2018-10-15 DIAGNOSIS — I350 Nonrheumatic aortic (valve) stenosis: Secondary | ICD-10-CM

## 2018-10-15 DIAGNOSIS — Z992 Dependence on renal dialysis: Secondary | ICD-10-CM | POA: Diagnosis not present

## 2018-10-15 DIAGNOSIS — D631 Anemia in chronic kidney disease: Secondary | ICD-10-CM | POA: Diagnosis not present

## 2018-10-15 DIAGNOSIS — D696 Thrombocytopenia, unspecified: Secondary | ICD-10-CM | POA: Diagnosis not present

## 2018-10-15 DIAGNOSIS — D509 Iron deficiency anemia, unspecified: Secondary | ICD-10-CM | POA: Diagnosis not present

## 2018-10-15 DIAGNOSIS — N2581 Secondary hyperparathyroidism of renal origin: Secondary | ICD-10-CM | POA: Diagnosis not present

## 2018-10-15 DIAGNOSIS — N186 End stage renal disease: Secondary | ICD-10-CM | POA: Diagnosis not present

## 2018-10-15 NOTE — Pre-Procedure Instructions (Signed)
   SMOKEY MELOTT  10/15/2018     [place], [place] [address] [place] [address] Phone: [phone] Fax: [place], [address] [place] [address] Phone: [phone] Fax: [phone]   Your procedure is scheduled on Tuesday, October 21, 2018  Report to [facility] Admitting at 8:30 A.M. (per MD)  Call this number if you have problems the morning of surgery:  [phone]   Remember: Brush your teeth the morning of surgery with your regular toothpaste.  Do not eat or drink after midnight Monday, August 10,2020  Take these medicines the morning of surgery with A SIP OF WATER : pantoprazole (PROTONIX) Stop taking  vitamins, fish oil and herbal medications. Do not take any NSAIDs ie: Ibuprofen, Advil, Naproxen (Aleve), Motrin, BC and Goody Powder; stop now.   Do not wear jewelry, make-up or nail polish.  Do not wear lotions, powders, or perfumes, or deodorant.  Do not shave 48 hours prior to surgery.  Men may shave face and neck.  Do not bring valuables to the hospital.  [facility] is not responsible for any belongings or valuables.  Contacts, dentures or bridgework may not be worn into surgery.  For patients admitted to the hospital, discharge time will be determined by your treatment team. Special instructions: Shower the night before and morning of surgery with CHG. Please read over the following fact sheets that you were given. Pain Booklet, Coughing and Deep Breathing, MRSA Information and Surgical Site Infection Prevention

## 2018-10-16 ENCOUNTER — Ambulatory Visit: Payer: Medicare Other | Admitting: Cardiovascular Disease

## 2018-10-16 ENCOUNTER — Encounter (HOSPITAL_COMMUNITY)
Admission: RE | Admit: 2018-10-16 | Discharge: 2018-10-16 | Disposition: A | Discharge status: Home / Self Care | Payer: Medicare Other | Source: Ambulatory Visit | Attending: Cardiovascular Disease | Admitting: Cardiovascular Disease

## 2018-10-16 ENCOUNTER — Encounter (HOSPITAL_COMMUNITY): Payer: Self-pay

## 2018-10-16 ENCOUNTER — Other Ambulatory Visit: Payer: Self-pay

## 2018-10-16 ENCOUNTER — Ambulatory Visit (HOSPITAL_COMMUNITY)
Admission: RE | Admit: 2018-10-16 | Discharge: 2018-10-16 | Disposition: A | Discharge status: Home / Self Care | Payer: Medicare Other | Source: Ambulatory Visit | Attending: Cardiovascular Disease | Admitting: Cardiovascular Disease

## 2018-10-16 DIAGNOSIS — N186 End stage renal disease: Secondary | ICD-10-CM | POA: Diagnosis not present

## 2018-10-16 DIAGNOSIS — I352 Nonrheumatic aortic (valve) stenosis with insufficiency: Secondary | ICD-10-CM | POA: Diagnosis not present

## 2018-10-16 DIAGNOSIS — Z20828 Contact with and (suspected) exposure to other viral communicable diseases: Secondary | ICD-10-CM | POA: Diagnosis not present

## 2018-10-16 DIAGNOSIS — R0602 Shortness of breath: Secondary | ICD-10-CM | POA: Diagnosis not present

## 2018-10-16 DIAGNOSIS — I472 Ventricular tachycardia: Secondary | ICD-10-CM | POA: Diagnosis not present

## 2018-10-16 DIAGNOSIS — I350 Nonrheumatic aortic (valve) stenosis: Secondary | ICD-10-CM | POA: Insufficient documentation

## 2018-10-16 DIAGNOSIS — I2510 Atherosclerotic heart disease of native coronary artery without angina pectoris: Secondary | ICD-10-CM | POA: Diagnosis not present

## 2018-10-16 DIAGNOSIS — I739 Peripheral vascular disease, unspecified: Secondary | ICD-10-CM | POA: Diagnosis not present

## 2018-10-16 DIAGNOSIS — I5043 Acute on chronic combined systolic (congestive) and diastolic (congestive) heart failure: Secondary | ICD-10-CM | POA: Diagnosis not present

## 2018-10-16 DIAGNOSIS — R64 Cachexia: Secondary | ICD-10-CM | POA: Diagnosis not present

## 2018-10-16 DIAGNOSIS — N2581 Secondary hyperparathyroidism of renal origin: Secondary | ICD-10-CM | POA: Diagnosis not present

## 2018-10-16 DIAGNOSIS — E8581 Light chain (AL) amyloidosis: Secondary | ICD-10-CM | POA: Diagnosis not present

## 2018-10-16 DIAGNOSIS — I132 Hypertensive heart and chronic kidney disease with heart failure and with stage 5 chronic kidney disease, or end stage renal disease: Secondary | ICD-10-CM | POA: Diagnosis not present

## 2018-10-16 DIAGNOSIS — I313 Pericardial effusion (noninflammatory): Secondary | ICD-10-CM | POA: Diagnosis not present

## 2018-10-16 HISTORY — DX: Nausea with vomiting, unspecified: R11.2

## 2018-10-16 HISTORY — DX: Other specified postprocedural states: Z98.890

## 2018-10-16 LAB — COMPREHENSIVE METABOLIC PANEL
ALT: 13 U/L (ref 0–44)
AST: 17 U/L (ref 15–41)
Albumin: 3.9 g/dL (ref 3.5–5.0)
Alkaline Phosphatase: 58 U/L (ref 38–126)
Anion gap: 16 — ABNORMAL HIGH (ref 5–15)
BUN: 26 mg/dL — ABNORMAL HIGH (ref 8–23)
CO2: 26 mmol/L (ref 22–32)
Calcium: 9.6 mg/dL (ref 8.9–10.3)
Chloride: 96 mmol/L — ABNORMAL LOW (ref 98–111)
Creatinine, Ser: 6.35 mg/dL — ABNORMAL HIGH (ref 0.61–1.24)
GFR calc Af Amer: 10 mL/min — ABNORMAL LOW (ref >60)
GFR calc non Af Amer: 8 mL/min — ABNORMAL LOW (ref >60)
Glucose, Bld: 98 mg/dL (ref 70–99)
Potassium: 3.7 mmol/L (ref 3.5–5.1)
Sodium: 138 mmol/L (ref 135–145)
Total Bilirubin: 1.1 mg/dL (ref 0.3–1.2)
Total Protein: 7 g/dL (ref 6.5–8.1)

## 2018-10-16 LAB — SURGICAL PCR SCREEN
MRSA, PCR: NEGATIVE
Staphylococcus aureus: NEGATIVE

## 2018-10-16 LAB — CBC
HCT: 31.1 % — ABNORMAL LOW (ref 39.0–52.0)
Hemoglobin: 9.9 g/dL — ABNORMAL LOW (ref 13.0–17.0)
MCH: 33.9 pg (ref 26.0–34.0)
MCHC: 31.8 g/dL (ref 30.0–36.0)
MCV: 106.5 fL — ABNORMAL HIGH (ref 80.0–100.0)
Platelets: 69 10*3/uL — ABNORMAL LOW (ref 150–400)
RBC: 2.92 MIL/uL — ABNORMAL LOW (ref 4.22–5.81)
RDW: 17.2 % — ABNORMAL HIGH (ref 11.5–15.5)
WBC: 4.6 10*3/uL (ref 4.0–10.5)
nRBC: 0 % (ref 0.0–0.2)

## 2018-10-16 LAB — BLOOD GAS, ARTERIAL
Acid-Base Excess: 5.9 mmol/L — ABNORMAL HIGH (ref 0.0–2.0)
Bicarbonate: 29.2 mmol/L — ABNORMAL HIGH (ref 20.0–28.0)
Drawn by: 421801
FIO2: 21
O2 Saturation: 94.3 %
Patient temperature: 98.6
pCO2 arterial: 37.1 mmHg (ref 32.0–48.0)
pH, Arterial: 7.508 — ABNORMAL HIGH (ref 7.350–7.450)
pO2, Arterial: 72.1 mmHg — ABNORMAL LOW (ref 83.0–108.0)

## 2018-10-16 LAB — PROTIME-INR
INR: 1.3 — ABNORMAL HIGH (ref 0.8–1.2)
Prothrombin Time: 16.3 seconds — ABNORMAL HIGH (ref 11.4–15.2)

## 2018-10-16 LAB — HEMOGLOBIN A1C
Hgb A1c MFr Bld: 5 % (ref 4.8–5.6)
Mean Plasma Glucose: 96.8 mg/dL

## 2018-10-16 LAB — BRAIN NATRIURETIC PEPTIDE: B Natriuretic Peptide: 3896.1 pg/mL — ABNORMAL HIGH (ref 0.0–100.0)

## 2018-10-16 LAB — APTT: aPTT: 37 s — ABNORMAL HIGH (ref 24–36)

## 2018-10-16 NOTE — Progress Notes (Signed)
Nurse spoke with Joellen Jersey, Surgical Coordinator, to make MD aware of abnomal labs; Katie stated  " we'll take a look at it. "

## 2018-10-16 NOTE — Progress Notes (Signed)
  [place] Cardiomyopathy Questionnaire  KCCQ-12 10/16/2018  Ability to shower/bathe Slightly limited  Ability to walk 1 block Quite a bit limited  Ability to hurry/jog Extremely limited  Edema feet/ankles/legs 3+ times a week, not every day  Limited by fatigue Several times a day  Limited by dyspnea Several times a day  Sitting up / on 3+ pillows Never over the past 2 weeks  Limited enjoyment of life Limited quite a bit  Rest of life w/ symptoms Mostly dissatisfied  Participation in hobbies Severely limited  Participation in chores Limited quite a bit  Visiting family/friends Limited quite a bit

## 2018-10-16 NOTE — Progress Notes (Signed)
Pt C/O of intermittent SOB " at rest and with exertion since after my toes were amputated;"  Karoline Caldwell, PA, Anesthesiology, made aware. Pt denies chest pain. Pt sated that he is under the care of Dr. Angelena Form, Cardiologist and Dr. [name], PCP. Pt denies having an EKG in the last month. Pt denies having a chest x ray in the last 2 weeks. Katie, Surgical Coordinator, advised nurse that pt should continue Plavix, do not take on DOS. LVM with Thurmond Butts, RN, to make MD aware that nurse cannot obtain UA because pt stated that he is a renal pt ( anuric). Pt denies having a stress test. .  Pt denies that he and family members tested positive for COVID-19; pt sated that he is scheduled to be tested on 8/7 and nurse reminded pt to quarantine.   Coronavirus Screening  Pt denies that he and family members experienced the following symptoms:  Cough yes/no: No Fever (>100.81F)  yes/no: No Runny nose yes/no: No Sore throat yes/no: No Difficulty breathing/shortness of breath  yes/no: No ( Pt stated that he has chronic SOB; see previous )  Have you or a family member traveled in the last 14 days and where? yes/no: No  Pt reminded that hospital visitation restrictions are in effect and the importance of the restrictions.   Pt verbalized understanding of all pre-op instructions.  Pt chart forwarded to PA, Anesthesiology, for review.

## 2018-10-17 ENCOUNTER — Other Ambulatory Visit: Payer: Self-pay

## 2018-10-17 ENCOUNTER — Other Ambulatory Visit (HOSPITAL_COMMUNITY): Payer: Medicare Other

## 2018-10-17 ENCOUNTER — Encounter (HOSPITAL_COMMUNITY): Payer: Self-pay | Admitting: Emergency Medicine

## 2018-10-17 ENCOUNTER — Other Ambulatory Visit (HOSPITAL_COMMUNITY)
Admission: RE | Admit: 2018-10-17 | Discharge: 2018-10-17 | Disposition: A | Discharge status: Home / Self Care | Payer: Medicare Other | Source: Ambulatory Visit | Attending: Cardiovascular Disease | Admitting: Cardiovascular Disease

## 2018-10-17 ENCOUNTER — Emergency Department (HOSPITAL_COMMUNITY): Payer: Medicare Other

## 2018-10-17 ENCOUNTER — Inpatient Hospital Stay (HOSPITAL_COMMUNITY)
Admission: EM | Admit: 2018-10-17 | Discharge: 2018-10-28 | DRG: 266 | Disposition: A | Discharge status: Home / Self Care | Payer: Medicare Other | Attending: Cardiovascular Disease | Admitting: Cardiovascular Disease

## 2018-10-17 ENCOUNTER — Inpatient Hospital Stay (HOSPITAL_COMMUNITY): Admission: RE | Admit: 2018-10-17 | Payer: Medicare Other | Source: Ambulatory Visit

## 2018-10-17 DIAGNOSIS — R0609 Other forms of dyspnea: Secondary | ICD-10-CM | POA: Diagnosis not present

## 2018-10-17 DIAGNOSIS — I493 Ventricular premature depolarization: Secondary | ICD-10-CM | POA: Diagnosis not present

## 2018-10-17 DIAGNOSIS — R0602 Shortness of breath: Secondary | ICD-10-CM | POA: Diagnosis not present

## 2018-10-17 DIAGNOSIS — D638 Anemia in other chronic diseases classified elsewhere: Secondary | ICD-10-CM | POA: Diagnosis not present

## 2018-10-17 DIAGNOSIS — I5022 Chronic systolic (congestive) heart failure: Secondary | ICD-10-CM | POA: Diagnosis not present

## 2018-10-17 DIAGNOSIS — I5021 Acute systolic (congestive) heart failure: Secondary | ICD-10-CM

## 2018-10-17 DIAGNOSIS — R0600 Dyspnea, unspecified: Secondary | ICD-10-CM | POA: Diagnosis present

## 2018-10-17 DIAGNOSIS — E8889 Other specified metabolic disorders: Secondary | ICD-10-CM | POA: Diagnosis present

## 2018-10-17 DIAGNOSIS — I5042 Chronic combined systolic (congestive) and diastolic (congestive) heart failure: Secondary | ICD-10-CM | POA: Diagnosis not present

## 2018-10-17 DIAGNOSIS — K219 Gastro-esophageal reflux disease without esophagitis: Secondary | ICD-10-CM | POA: Diagnosis present

## 2018-10-17 DIAGNOSIS — I350 Nonrheumatic aortic (valve) stenosis: Secondary | ICD-10-CM | POA: Insufficient documentation

## 2018-10-17 DIAGNOSIS — Z89422 Acquired absence of other left toe(s): Secondary | ICD-10-CM

## 2018-10-17 DIAGNOSIS — F419 Anxiety disorder, unspecified: Secondary | ICD-10-CM | POA: Diagnosis not present

## 2018-10-17 DIAGNOSIS — I255 Ischemic cardiomyopathy: Secondary | ICD-10-CM | POA: Diagnosis present

## 2018-10-17 DIAGNOSIS — I4729 Other ventricular tachycardia: Secondary | ICD-10-CM

## 2018-10-17 DIAGNOSIS — G2581 Restless legs syndrome: Secondary | ICD-10-CM | POA: Diagnosis present

## 2018-10-17 DIAGNOSIS — I700 Atherosclerosis of aorta: Secondary | ICD-10-CM | POA: Diagnosis present

## 2018-10-17 DIAGNOSIS — Z9049 Acquired absence of other specified parts of digestive tract: Secondary | ICD-10-CM

## 2018-10-17 DIAGNOSIS — R531 Weakness: Secondary | ICD-10-CM | POA: Diagnosis not present

## 2018-10-17 DIAGNOSIS — G629 Polyneuropathy, unspecified: Secondary | ICD-10-CM | POA: Diagnosis present

## 2018-10-17 DIAGNOSIS — I5031 Acute diastolic (congestive) heart failure: Secondary | ICD-10-CM | POA: Diagnosis present

## 2018-10-17 DIAGNOSIS — I2510 Atherosclerotic heart disease of native coronary artery without angina pectoris: Secondary | ICD-10-CM | POA: Diagnosis not present

## 2018-10-17 DIAGNOSIS — N2581 Secondary hyperparathyroidism of renal origin: Secondary | ICD-10-CM | POA: Diagnosis present

## 2018-10-17 DIAGNOSIS — I4510 Unspecified right bundle-branch block: Secondary | ICD-10-CM | POA: Diagnosis present

## 2018-10-17 DIAGNOSIS — R0989 Other specified symptoms and signs involving the circulatory and respiratory systems: Secondary | ICD-10-CM | POA: Diagnosis present

## 2018-10-17 DIAGNOSIS — Z7902 Long term (current) use of antithrombotics/antiplatelets: Secondary | ICD-10-CM

## 2018-10-17 DIAGNOSIS — I2582 Chronic total occlusion of coronary artery: Secondary | ICD-10-CM | POA: Diagnosis present

## 2018-10-17 DIAGNOSIS — I953 Hypotension of hemodialysis: Secondary | ICD-10-CM | POA: Diagnosis present

## 2018-10-17 DIAGNOSIS — Z006 Encounter for examination for normal comparison and control in clinical research program: Secondary | ICD-10-CM

## 2018-10-17 DIAGNOSIS — N186 End stage renal disease: Secondary | ICD-10-CM

## 2018-10-17 DIAGNOSIS — I313 Pericardial effusion (noninflammatory): Secondary | ICD-10-CM | POA: Diagnosis not present

## 2018-10-17 DIAGNOSIS — Z01812 Encounter for preprocedural laboratory examination: Secondary | ICD-10-CM | POA: Insufficient documentation

## 2018-10-17 DIAGNOSIS — Z992 Dependence on renal dialysis: Secondary | ICD-10-CM | POA: Diagnosis not present

## 2018-10-17 DIAGNOSIS — I132 Hypertensive heart and chronic kidney disease with heart failure and with stage 5 chronic kidney disease, or end stage renal disease: Principal | ICD-10-CM | POA: Diagnosis present

## 2018-10-17 DIAGNOSIS — I472 Ventricular tachycardia: Secondary | ICD-10-CM | POA: Diagnosis not present

## 2018-10-17 DIAGNOSIS — R627 Adult failure to thrive: Secondary | ICD-10-CM | POA: Diagnosis present

## 2018-10-17 DIAGNOSIS — M5136 Other intervertebral disc degeneration, lumbar region: Secondary | ICD-10-CM | POA: Diagnosis present

## 2018-10-17 DIAGNOSIS — Z8249 Family history of ischemic heart disease and other diseases of the circulatory system: Secondary | ICD-10-CM

## 2018-10-17 DIAGNOSIS — R079 Chest pain, unspecified: Secondary | ICD-10-CM | POA: Diagnosis not present

## 2018-10-17 DIAGNOSIS — E8770 Fluid overload, unspecified: Secondary | ICD-10-CM | POA: Diagnosis present

## 2018-10-17 DIAGNOSIS — R002 Palpitations: Secondary | ICD-10-CM | POA: Diagnosis not present

## 2018-10-17 DIAGNOSIS — E785 Hyperlipidemia, unspecified: Secondary | ICD-10-CM | POA: Diagnosis present

## 2018-10-17 DIAGNOSIS — K5730 Diverticulosis of large intestine without perforation or abscess without bleeding: Secondary | ICD-10-CM | POA: Diagnosis present

## 2018-10-17 DIAGNOSIS — E8581 Light chain (AL) amyloidosis: Secondary | ICD-10-CM | POA: Diagnosis not present

## 2018-10-17 DIAGNOSIS — Z961 Presence of intraocular lens: Secondary | ICD-10-CM | POA: Diagnosis present

## 2018-10-17 DIAGNOSIS — D696 Thrombocytopenia, unspecified: Secondary | ICD-10-CM | POA: Diagnosis present

## 2018-10-17 DIAGNOSIS — D631 Anemia in chronic kidney disease: Secondary | ICD-10-CM | POA: Diagnosis not present

## 2018-10-17 DIAGNOSIS — Z79899 Other long term (current) drug therapy: Secondary | ICD-10-CM

## 2018-10-17 DIAGNOSIS — I739 Peripheral vascular disease, unspecified: Secondary | ICD-10-CM | POA: Diagnosis present

## 2018-10-17 DIAGNOSIS — Z952 Presence of prosthetic heart valve: Secondary | ICD-10-CM

## 2018-10-17 DIAGNOSIS — I5043 Acute on chronic combined systolic (congestive) and diastolic (congestive) heart failure: Secondary | ICD-10-CM | POA: Diagnosis present

## 2018-10-17 DIAGNOSIS — Z7982 Long term (current) use of aspirin: Secondary | ICD-10-CM

## 2018-10-17 DIAGNOSIS — I120 Hypertensive chronic kidney disease with stage 5 chronic kidney disease or end stage renal disease: Secondary | ICD-10-CM | POA: Diagnosis not present

## 2018-10-17 DIAGNOSIS — J811 Chronic pulmonary edema: Secondary | ICD-10-CM | POA: Diagnosis not present

## 2018-10-17 DIAGNOSIS — I10 Essential (primary) hypertension: Secondary | ICD-10-CM | POA: Diagnosis not present

## 2018-10-17 DIAGNOSIS — Z9842 Cataract extraction status, left eye: Secondary | ICD-10-CM

## 2018-10-17 DIAGNOSIS — Z20828 Contact with and (suspected) exposure to other viral communicable diseases: Secondary | ICD-10-CM | POA: Diagnosis not present

## 2018-10-17 DIAGNOSIS — Z87891 Personal history of nicotine dependence: Secondary | ICD-10-CM

## 2018-10-17 DIAGNOSIS — R34 Anuria and oliguria: Secondary | ICD-10-CM | POA: Diagnosis present

## 2018-10-17 DIAGNOSIS — R008 Other abnormalities of heart beat: Secondary | ICD-10-CM | POA: Diagnosis present

## 2018-10-17 DIAGNOSIS — R64 Cachexia: Secondary | ICD-10-CM | POA: Diagnosis present

## 2018-10-17 DIAGNOSIS — Z9841 Cataract extraction status, right eye: Secondary | ICD-10-CM

## 2018-10-17 DIAGNOSIS — I352 Nonrheumatic aortic (valve) stenosis with insufficiency: Secondary | ICD-10-CM | POA: Diagnosis not present

## 2018-10-17 DIAGNOSIS — I252 Old myocardial infarction: Secondary | ICD-10-CM

## 2018-10-17 DIAGNOSIS — J90 Pleural effusion, not elsewhere classified: Secondary | ICD-10-CM | POA: Diagnosis not present

## 2018-10-17 HISTORY — DX: Anemia in other chronic diseases classified elsewhere: D63.8

## 2018-10-17 HISTORY — DX: Nonrheumatic aortic (valve) stenosis: I35.0

## 2018-10-17 LAB — BASIC METABOLIC PANEL
Anion gap: 19 — ABNORMAL HIGH (ref 5–15)
BUN: 40 mg/dL — ABNORMAL HIGH (ref 8–23)
CO2: 26 mmol/L (ref 22–32)
Calcium: 9.3 mg/dL (ref 8.9–10.3)
Chloride: 95 mmol/L — ABNORMAL LOW (ref 98–111)
Creatinine, Ser: 8.37 mg/dL — ABNORMAL HIGH (ref 0.61–1.24)
GFR calc Af Amer: 7 mL/min — ABNORMAL LOW (ref >60)
GFR calc non Af Amer: 6 mL/min — ABNORMAL LOW (ref >60)
Glucose, Bld: 112 mg/dL — ABNORMAL HIGH (ref 70–99)
Potassium: 3.6 mmol/L (ref 3.5–5.1)
Sodium: 140 mmol/L (ref 135–145)

## 2018-10-17 LAB — CBC
HCT: 33.4 % — ABNORMAL LOW (ref 39.0–52.0)
Hemoglobin: 10.5 g/dL — ABNORMAL LOW (ref 13.0–17.0)
MCH: 34 pg (ref 26.0–34.0)
MCHC: 31.4 g/dL (ref 30.0–36.0)
MCV: 108.1 fL — ABNORMAL HIGH (ref 80.0–100.0)
Platelets: 69 10*3/uL — ABNORMAL LOW (ref 150–400)
RBC: 3.09 MIL/uL — ABNORMAL LOW (ref 4.22–5.81)
RDW: 17.2 % — ABNORMAL HIGH (ref 11.5–15.5)
WBC: 4.2 10*3/uL (ref 4.0–10.5)
nRBC: 0 % (ref 0.0–0.2)

## 2018-10-17 LAB — SARS CORONAVIRUS 2 BY RT PCR (HOSPITAL ORDER, PERFORMED IN ~~LOC~~ HOSPITAL LAB): SARS Coronavirus 2: NEGATIVE

## 2018-10-17 LAB — SARS CORONAVIRUS 2 (TAT 6-24 HRS): SARS Coronavirus 2: NEGATIVE

## 2018-10-17 LAB — TROPONIN I (HIGH SENSITIVITY)
Troponin I (High Sensitivity): 85 ng/L — ABNORMAL HIGH (ref <18)
Troponin I (High Sensitivity): 77 ng/L — ABNORMAL HIGH (ref <18)

## 2018-10-17 MED ORDER — ATORVASTATIN CALCIUM 80 MG PO TABS
80.0000 mg | ORAL_TABLET | Freq: Every day | ORAL | Status: DC
  Administered 2018-10-17 – 2018-10-20 (×4): 80 mg via ORAL
  Filled 2018-10-17 (×4): qty 1

## 2018-10-17 MED ORDER — SODIUM CHLORIDE 0.9% FLUSH
3.0000 mL | Freq: Once | INTRAVENOUS | Status: AC
  Administered 2018-10-18: 3 mL via INTRAVENOUS

## 2018-10-17 MED ORDER — VITAMIN B-12 1000 MCG PO TABS
1000.0000 ug | ORAL_TABLET | Freq: Every evening | ORAL | Status: DC
  Administered 2018-10-17 – 2018-10-20 (×4): 1000 ug via ORAL
  Filled 2018-10-17 (×4): qty 1

## 2018-10-17 MED ORDER — SODIUM CHLORIDE 0.9% FLUSH
3.0000 mL | Freq: Two times a day (BID) | INTRAVENOUS | Status: DC
  Administered 2018-10-18 – 2018-10-20 (×5): 3 mL via INTRAVENOUS

## 2018-10-17 MED ORDER — ACETAMINOPHEN 325 MG PO TABS: 650.0000 mg | ORAL_TABLET | Freq: Four times a day (QID) | ORAL | Status: DC | PRN

## 2018-10-17 MED ORDER — GABAPENTIN 600 MG PO TABS
600.0000 mg | ORAL_TABLET | Freq: Every day | ORAL | Status: DC
  Administered 2018-10-17: 600 mg via ORAL
  Filled 2018-10-17: qty 1

## 2018-10-17 MED ORDER — SUCROFERRIC OXYHYDROXIDE 500 MG PO CHEW
1000.0000 mg | CHEWABLE_TABLET | Freq: Three times a day (TID) | ORAL | Status: DC
  Filled 2018-10-17 (×2): qty 2

## 2018-10-17 MED ORDER — ACETAMINOPHEN 650 MG RE SUPP: 650.0000 mg | Freq: Four times a day (QID) | RECTAL | Status: DC | PRN

## 2018-10-17 MED ORDER — ASPIRIN 81 MG PO CHEW
324.0000 mg | CHEWABLE_TABLET | Freq: Once | ORAL | Status: AC
  Administered 2018-10-17: 324 mg via ORAL
  Filled 2018-10-17: qty 4

## 2018-10-17 MED ORDER — RENA-VITE PO TABS
1.0000 | ORAL_TABLET | Freq: Every day | ORAL | Status: DC
  Administered 2018-10-17 – 2018-10-20 (×4): 1 via ORAL
  Filled 2018-10-17 (×4): qty 1

## 2018-10-17 MED ORDER — GABAPENTIN 600 MG PO TABS: 600.0000 mg | ORAL_TABLET | Freq: Every day | ORAL | Status: DC | PRN

## 2018-10-17 MED ORDER — LORATADINE 10 MG PO TABS
10.0000 mg | ORAL_TABLET | Freq: Every day | ORAL | Status: DC
  Administered 2018-10-18 – 2018-10-20 (×3): 10 mg via ORAL
  Filled 2018-10-17 (×4): qty 1

## 2018-10-17 MED ORDER — CLOPIDOGREL BISULFATE 75 MG PO TABS
75.0000 mg | ORAL_TABLET | Freq: Every day | ORAL | Status: DC
  Administered 2018-10-17 – 2018-10-20 (×4): 75 mg via ORAL
  Filled 2018-10-17 (×4): qty 1

## 2018-10-17 MED ORDER — MIDODRINE HCL 5 MG PO TABS: 10.0000 mg | ORAL_TABLET | ORAL | Status: DC

## 2018-10-17 MED ORDER — GABAPENTIN 300 MG PO CAPS
300.0000 mg | ORAL_CAPSULE | Freq: Every day | ORAL | Status: DC
  Administered 2018-10-18 – 2018-10-20 (×3): 300 mg via ORAL
  Filled 2018-10-17 (×4): qty 1

## 2018-10-17 MED ORDER — PANTOPRAZOLE SODIUM 40 MG PO TBEC
40.0000 mg | DELAYED_RELEASE_TABLET | Freq: Every evening | ORAL | Status: DC
  Administered 2018-10-17 – 2018-10-20 (×4): 40 mg via ORAL
  Filled 2018-10-17 (×4): qty 1

## 2018-10-17 MED ORDER — ASPIRIN EC 81 MG PO TBEC
81.0000 mg | DELAYED_RELEASE_TABLET | Freq: Every evening | ORAL | Status: DC
  Administered 2018-10-18 – 2018-10-20 (×3): 81 mg via ORAL
  Filled 2018-10-17 (×4): qty 1

## 2018-10-17 MED ORDER — SUCROFERRIC OXYHYDROXIDE 500 MG PO CHEW
500.0000 mg | CHEWABLE_TABLET | Freq: Three times a day (TID) | ORAL | Status: DC | PRN
  Filled 2018-10-17: qty 1

## 2018-10-17 NOTE — ED Notes (Signed)
Phlebotomy at bedside.

## 2018-10-17 NOTE — Consult Note (Signed)
CARDIOLOGY CONSULT NOTE       Patient ID: Jose Garrett MRN: [id number] DOB/AGE: March 21, 1950 68 y.o.  Admit date: 10/17/2018 Referring Physician: Langston Masker PA-C ER Primary Physician: [name], MD Primary Cardiologist: Levin Bacon Reason for Consultation: Dyspnea  Active Problems:   * No active hospital problems. *   HPI:  68 y.o. with multiple medical problems and recent failure to thrive.He has ESRD on dialysis for 3 years. Anemia, PVD  With left fem-pop bypass. CAD MI 2019 with occluded collateralized RCA last cath for TAVR w/u 07/24/18 stable with no left sided disease EF 35-40% with inferior MI and severe low gradient AS mean gradient 30 mmHg peak 54 mmHg. He is scheduled for TAVR next Tuesday with Dr Burt Knack and Roxy Manns. He has had low BP on dialysis lately and last few months feels poorly after Rx. He did not go to dialysis today just felt dyspnea and nausea. No chest pain. He has had to live with his daughter last few months. Dyspnea has been chronic but worse last few weeks.   ROS All other systems reviewed and negative except as noted above  Past Medical History:  Diagnosis Date   Acute myocardial infarction involving right coronary artery (HCC)    Amyloidosis (HCC)    Anemia    Anxiety    Aortic stenosis    severe   CAD (coronary artery disease)    a. cardiac cath on 11/25/17 with occlusion of the RCA which could have been his event but the RCA could not be engaged. The RCA filled distally from left to right collaterals. There were no severe stenoses in the left coronary system. Medical therapy recommended, started on plavix.   Chronic combined systolic and diastolic CHF (congestive heart failure) ([place])    a. EF dropped to 35-40% with grade 2 DD by echo 11/2017.   DJD (degenerative joint disease), lumbar    Dyspnea    ESRD (end stage renal disease) (HCC)    ESRD- HEMO MWF- [facility]   GERD (gastroesophageal reflux disease)    H/O seasonal  allergies    Hyperlipidemia    Hypertension    Muscle spasms of lower extremity     RLE   " it is because of my back"   Neuropathy    Peripheral vascular disease (HCC)    Pneumonia    PONV (postoperative nausea and vomiting)    Restless legs    Wears glasses     Family History  Problem Relation Age of Onset   Hypertension Mother    Diabetes Mother    Heart disease Mother    Skin cancer Mother    Hypertension Father    Diabetes Father    Diabetes Sister     Social History   Socioeconomic History   Marital status: Divorced    Spouse name: Not on file   Number of children: Not on file   Years of education: Not on file   Highest education level: Not on file  Occupational History   Not on file  Social Needs   Financial resource strain: Not on file   Food insecurity    Worry: Not on file    Inability: Not on file   Transportation needs    Medical: Not on file    Non-medical: Not on file  Tobacco Use   Smoking status: Former Smoker    Packs/day: 2.00    Years: 40.00    Pack years: 80.00  Types: Cigarettes    Quit date: 03/19/2011    Years since quitting: 7.5   Smokeless tobacco: Never Used  Substance and Sexual Activity   Alcohol use: No    Alcohol/week: 0.0 standard drinks   Drug use: No    Comment:  "I have used marijuana till the 1990's". Notes that he quit 15 yrs ago   Sexual activity: Not Currently  Lifestyle   Physical activity    Days per week: Not on file    Minutes per session: Not on file   Stress: Not on file  Relationships   Social connections    Talks on phone: Not on file    Gets together: Not on file    Attends religious service: Not on file    Active member of club or organization: Not on file    Attends meetings of clubs or organizations: Not on file    Relationship status: Not on file   Intimate partner violence    Fear of current or ex partner: Not on file    Emotionally abused: Not on file     Physically abused: Not on file    Forced sexual activity: Not on file  Other Topics Concern   Not on file  Social History Narrative   Pt lives alone in a 1 story home   Has 3 adult daughters   Highest level of education: associates degree   Worked in [employer], [employer]/film/video.     Past Surgical History:  Procedure Laterality Date   ABDOMINAL AORTOGRAM W/LOWER EXTREMITY Bilateral 07/16/2018   Procedure: ABDOMINAL AORTOGRAM W/LOWER EXTREMITY;  Surgeon: Wellington Hampshire, MD;  [facility];  Service: Cardiovascular;  Laterality: Bilateral;   AMPUTATION Left 08/19/2018   Procedure: AMPUTATION LEFT TOES SECOND AND THIRD;  Surgeon: Waynetta Sandy, MD;  Location: [facility];  Service: Vascular;  Laterality: Left;   AV FISTULA PLACEMENT Right 12/27/2015   Procedure: Right Arm ARTERIOVENOUS (AV) FISTULA CREATION;  Surgeon: Angelia Mould, MD;  Location: [facility];  Service: Vascular;  Laterality: Right;   CATARACT EXTRACTION W/ INTRAOCULAR LENS  IMPLANT, BILATERAL     COLONOSCOPY     ERCP W/ SPHICTEROTOMY     FEMORAL-POPLITEAL BYPASS GRAFT Left 07/22/2018   Procedure: left FEMORAL TO BELOW KNEE POPLITEAL ARTERY BYPASS GREAFT using 39mm removable ring propaten gore graft;  Surgeon: Waynetta Sandy, MD;  Location: [facility];  Service: Vascular;  Laterality: Left;   IR GENERIC HISTORICAL  01/27/2016   IR FLUORO GUIDE CV LINE RIGHT 01/27/2016 Arne Cleveland, MD [facility]   IR GENERIC HISTORICAL  01/27/2016   IR US GUIDE VASC ACCESS RIGHT 01/27/2016 Arne Cleveland, MD [facility]   LAPAROSCOPIC CHOLECYSTECTOMY  03/2011   LAPAROSCOPIC CHOLECYSTECTOMY     LEFT HEART CATH AND CORONARY ANGIOGRAPHY N/A 11/25/2017   Procedure: LEFT HEART CATH AND CORONARY ANGIOGRAPHY;  Surgeon: Martinique, Beverley Allender M, MD;  Location: [facility];  Service: Cardiovascular;  Laterality: N/A;   LEFT HEART CATH AND CORONARY ANGIOGRAPHY N/A 07/24/2018   Procedure: LEFT HEART CATH AND  CORONARY ANGIOGRAPHY;  Surgeon: Sherren Mocha, MD;  Location: [facility];  Service: Cardiovascular;  Laterality: N/A;   RENAL BIOPSY, PERCUTANEOUS Right 09/08/2014   RIGHT/LEFT HEART CATH AND CORONARY ANGIOGRAPHY N/A 03/25/2018   Procedure: RIGHT/LEFT HEART CATH AND CORONARY ANGIOGRAPHY;  Surgeon: Martinique, Maisyn Nouri M, MD;  Location: [facility];  Service: Cardiovascular;  Laterality: N/A;   TOE AMPUTATION Left 08/19/2018  2nd & 3rd toes   TONSILLECTOMY  ~ 1960   ULTRASOUND GUIDANCE FOR VASCULAR ACCESS  03/25/2018   Procedure: Ultrasound Guidance For Vascular Access;  Surgeon: Martinique, Allean Montfort M, MD;  Location: [facility];  Service: Cardiovascular;;      sodium chloride flush  3 mL Intravenous Once     Physical Exam: Blood pressure 116/72, pulse 88, temperature 97.7 F (36.5 C), temperature source Oral, resp. rate 19, SpO2 96 %.   Chronically ill white male in no distress Basilar rales bilateral  Significant AS murmur  Dialysis fistula in RUE.  Plus 2 LLE edema Plus 1 RLE Fempop bypass LLE with palpable DP  Labs:   Lab Results  Component Value Date   WBC 4.2 10/17/2018   HGB 10.5 (L) 10/17/2018   HCT 33.4 (L) 10/17/2018   MCV 108.1 (H) 10/17/2018   PLT 69 (L) 10/17/2018    Recent Labs  Lab 10/16/18 1045 10/17/18 1318  NA 138 140  K 3.7 3.6  CL 96* 95*  CO2 26 26  BUN 26* 40*  CREATININE 6.35* 8.37*  CALCIUM 9.6 9.3  PROT 7.0  --   BILITOT 1.1  --   ALKPHOS 58  --   ALT 13  --   AST 17  --   GLUCOSE 98 112*   Lab Results  Component Value Date   TROPONINI >65.00 (HH) 07/24/2018    Lab Results  Component Value Date   CHOL 117 02/12/2018   CHOL 192 02/25/2014   CHOL 177 11/20/2013   Lab Results  Component Value Date   HDL 32 (L) 02/12/2018   HDL 26 (L) 02/25/2014   HDL 31 (L) 11/20/2013   Lab Results  Component Value Date   LDLCALC 50 02/12/2018   LDLCALC 126 (H) 02/25/2014   LDLCALC NOT CALC 11/20/2013   Lab Results    Component Value Date   TRIG 174 (H) 02/12/2018   TRIG 210 (H) 11/28/2017   TRIG 119 11/25/2017   Lab Results  Component Value Date   CHOLHDL 3.7 02/12/2018   CHOLHDL 7.4 02/25/2014   CHOLHDL 5.7 11/20/2013   No results found for: LDLDIRECT    Radiology: Dg Chest 2 View  Result Date: 10/16/2018 CLINICAL DATA:  Valve replacement shortness of breath with exertion EXAM: CHEST - 2 VIEW COMPARISON:  07/23/2018 FINDINGS: Mild cardiomegaly with prominent central pulmonary vessels. Tiny pleural effusions. No focal consolidation. Aortic atherosclerosis. No pneumothorax. IMPRESSION: 1. Cardiomegaly with tiny pleural effusions. Electronically Signed   By: Donavan Foil M.D.   On: 10/16/2018 15:39   Ct Coronary Morph W/cta Cor W/score W/ca W/cm &/or Wo/cm  Addendum Date: 09/30/2018   ADDENDUM REPORT: 09/30/2018 14:13 EXAM: OVER-READ INTERPRETATION  CT CHEST The following report is an over-read performed by radiologist Dr. Rebekah [facility] on 09/30/2018. This over-read does not include interpretation of cardiac or coronary anatomy or pathology. The coronary calcium score or cardiac CTA interpretation by the cardiologist is attached. COMPARISON:  None. FINDINGS: Extracardiac findings will be described under separate dictation for contemporaneously obtained CTA chest, abdomen and pelvis. IMPRESSION: Please see separate dictation for contemporaneously obtained CTA chest, abdomen and pelvis dated 09/30/2018 for full description of relevant extracardiac findings. Electronically Signed   By: Vinnie Langton M.D.   On: 09/30/2018 14:13   Result Date: 09/30/2018 CLINICAL DATA:  68 year old male with severe aortic stenosis being evaluated for a TAVR procedure. EXAM: Cardiac TAVR CT TECHNIQUE: The patient was scanned on a  [employer]. A 120 kV retrospective scan was triggered in the descending thoracic aorta at 111 HU's. Gantry rotation speed was 250 msecs and collimation was .6  mm. No beta blockade or nitro were given. The 3D data set was reconstructed in 5% intervals of the R-R cycle. Systolic and diastolic phases were analyzed on a dedicated work station using MPR, MIP and VRT modes. The patient received 80 cc of contrast. FINDINGS: Aortic Valve: Trileaflet aortic valve with severely thickened and calcified leaflets with severely restricted leaflet opening and no calcifications extending into the LVOT. Aorta: Normal size, mild diffuse atherosclerotic plaque and calcifications and no dissection. Sinotubular Junction: 31 x 29 mm Ascending Thoracic Aorta: 31 x 30 mm Aortic Arch: 28 x 26 mm Descending Thoracic Aorta: 23 x 22 mm Sinus of Valsalva Measurements: Non-coronary: 34 mm Right -coronary: 33 mm Left -coronary: 35 mm Coronary Artery Height above Annulus: Left Main: 12 mm Right Coronary: 19 mm (RCA occluded) Virtual Basal Annulus Measurements: Maximum/Minimum Diameter: 28.6 x 24.1 mm Mean Diameter: 25.4 mm Perimeter: 81.5 mm Area: 508 mm2 Optimum Fluoroscopic Angle for Delivery: LAO 6 CAU 7. IMPRESSION: 1. Trileaflet aortic valve with severely thickened and calcified leaflets with severely restricted leaflet opening and no calcifications extending into the LVOT. Aortic valve calcium score 2859 consistent with severe aortic stenosis. Annular measurements suitable for delivery of a 26 mm Edwards-SAPIEN 3 valve. 2. Sufficient coronary to annulus distance. 3. Optimum Fluoroscopic Angle for Delivery:  LAO 6 CAU 7. 4. No thrombus in the left atrial appendage. Electronically Signed: By: Ena Dawley On: 09/30/2018 12:10   Dg Chest Portable 1 View  Result Date: 10/17/2018 CLINICAL DATA:  Intermittent chest pain with exertion. EXAM: PORTABLE CHEST 1 VIEW COMPARISON:  October 16, 2018 FINDINGS: No pneumothorax. Stable cardiomegaly. The hila and mediastinum are unremarkable. No pulmonary nodules or masses. No focal infiltrates. No overt edema. Mild pulmonary venous congestion not excluded.  IMPRESSION: Cardiomegaly. Suspected mild pulmonary venous congestion. No other abnormalities. Electronically Signed   By: Dorise Bullion III M.D   On: 10/17/2018 16:52   Ct Angio Chest Aorta W &/or Wo Contrast  Result Date: 09/30/2018 CLINICAL DATA:  68 year old male with history of severe aortic stenosis. Preprocedural study prior to potential transcatheter aortic valve replacement (TAVR) procedure. EXAM: CT ANGIOGRAPHY CHEST, ABDOMEN AND PELVIS TECHNIQUE: Multidetector CT imaging through the chest, abdomen and pelvis was performed using the standard protocol during bolus administration of intravenous contrast. Multiplanar reconstructed images and MIPs were obtained and reviewed to evaluate the vascular anatomy. CONTRAST:  187mL OMNIPAQUE IOHEXOL 350 MG/ML SOLN COMPARISON:  PET-CT 10/14/2014. CT the abdomen and pelvis 09/08/2014. FINDINGS: CTA CHEST FINDINGS Cardiovascular: Heart size is enlarged with moderate left ventricular dilatation. There is no significant pericardial fluid, thickening or pericardial calcification. There is aortic atherosclerosis, as well as atherosclerosis of the great vessels of the mediastinum and the coronary arteries, including calcified atherosclerotic plaque in the left main, left anterior descending, left circumflex and right coronary arteries. Severe thickening calcification of the aortic valve. Mild calcifications of the mitral annulus. Mediastinum/Lymph Nodes: No pathologically enlarged mediastinal or hilar lymph nodes. Esophagus is unremarkable in appearance. No axillary lymphadenopathy. Lungs/Pleura: No suspicious appearing pulmonary nodules or masses are noted. No acute consolidative airspace disease. Moderate bilateral pleural effusions with some mild dependent subsegmental atelectasis. Mild diffuse bronchial wall thickening with mild centrilobular and paraseptal emphysema. Musculoskeletal/Soft Tissues: There are no aggressive appearing lytic or blastic lesions noted in  the visualized portions of the  skeleton. CTA ABDOMEN AND PELVIS FINDINGS Hepatobiliary: 2.3 cm low-attenuation lesion in segment 7 of the liver, increased in size compared to remote prior study from 2016, compatible with a slow early growing simple cyst. No other suspicious cystic or solid hepatic lesions. No intra or extrahepatic biliary ductal dilatation. Status post cholecystectomy. Pancreas: No pancreatic mass. No pancreatic ductal dilatation. No pancreatic or peripancreatic fluid or inflammatory changes. Spleen: Unremarkable. Adrenals/Urinary Tract: Moderate bilateral renal atrophy. Numerous subcentimeter low-attenuation lesions in the kidneys bilaterally, too small to characterize, but statistically likely to represent tiny cysts. Bilateral adrenal glands are normal in appearance. No hydroureteronephrosis. Urinary bladder is normal in appearance. Stomach/Bowel: Normal appearance of the stomach. No pathologic dilatation of small bowel or colon. Numerous colonic diverticulae are noted, particularly in the sigmoid colon, without surrounding inflammatory changes to suggest an acute diverticulitis at this time. Normal appendix. Vascular/Lymphatic: Aortic atherosclerosis with vascular findings and measurements pertinent to potential TAVR procedure, as detailed below. Extending from the medial aspect of the left common femoral artery distally, immediately adjacent to the bifurcation, there is a 1.7 x 0.8 x 0.7 cm focal outpouching (axial image 199 of series 16 and coronal image 51 of series 18), compatible with a pseudoaneurysm. Anterior to the superficial femoral artery (axial image 204 of series 16) there is a large 5.6 x 7.6 x 8.1 cm low-intermediate attenuation lesion, likely to represent a resolving hematoma. Multiple prominent borderline enlarged retroperitoneal lymph nodes, nonspecific. Reproductive: Prostate gland and seminal vesicles are unremarkable in appearance. Other: No significant volume of ascites.   No pneumoperitoneum. Musculoskeletal: There are no aggressive appearing lytic or blastic lesions noted in the visualized portions of the skeleton. VASCULAR MEASUREMENTS PERTINENT TO TAVR: AORTA: Minimal Aortic Diameter-11 x 14 mm Severity of Aortic Calcification-severe RIGHT PELVIS: Right Common Iliac Artery - Minimal Diameter-6.6 x 5.2 mm Tortuosity - mild Calcification-severe Right External Iliac Artery - Minimal Diameter-5.4 x 5.9 mm Tortuosity - mild Calcification-moderate Right Common Femoral Artery - Minimal Diameter-6.1 x 5.3 mm Tortuosity - mild Calcification-moderate LEFT PELVIS: Left Common Iliac Artery - Minimal Diameter-7.0 x 7.0 mm Tortuosity - mild Calcification-severe Left External Iliac Artery - Minimal Diameter-7.5 x 6.7 mm Tortuosity - mild Calcification-mild Left Common Femoral Artery - Minimal Diameter-8.3 x 6.6 mm Tortuosity-mild Calcification-mild-to-moderate Review of the MIP images confirms the above findings. IMPRESSION: 1. Vascular findings and measurements pertinent to potential TAVR procedure, as detailed above. 2. Large low-intermediate attenuation fluid collection in the left inguinal region likely to represent a resolving hematoma. There is also a small pseudoaneurysm extending off the medial aspect of the distal left common femoral artery, as above. 3. Severe thickening calcification of the aortic valve, compatible with the reported clinical history of severe aortic stenosis. 4. Cardiomegaly with moderate left ventricular dilatation. 5. Aortic atherosclerosis, in addition to left main and 3 vessel coronary artery disease. Please note that although the presence of coronary artery calcium documents the presence of coronary artery disease, the severity of this disease and any potential stenosis cannot be assessed on this non-gated CT examination. Assessment for potential risk factor modification, dietary therapy or pharmacologic therapy may be warranted, if clinically indicated. 6.  Moderate bilateral renal atrophy. 7. Colonic diverticulosis without evidence of acute diverticulitis at this time. 8. Additional incidental findings, as above. Electronically Signed   By: Vinnie Langton M.D.   On: 09/30/2018 14:13   Ct Angio Chest Aorta W &/or Wo Contrast  Result Date: 09/30/2018 CLINICAL DATA:  68 year old male with history of severe aortic  stenosis. Preprocedural study prior to potential transcatheter aortic valve replacement (TAVR) procedure. EXAM: CT ANGIOGRAPHY CHEST, ABDOMEN AND PELVIS TECHNIQUE: Multidetector CT imaging through the chest, abdomen and pelvis was performed using the standard protocol during bolus administration of intravenous contrast. Multiplanar reconstructed images and MIPs were obtained and reviewed to evaluate the vascular anatomy. CONTRAST:  172mL OMNIPAQUE IOHEXOL 350 MG/ML SOLN COMPARISON:  PET-CT 10/14/2014. CT the abdomen and pelvis 09/08/2014. FINDINGS: CTA CHEST FINDINGS Cardiovascular: Heart size is enlarged with moderate left ventricular dilatation. There is no significant pericardial fluid, thickening or pericardial calcification. There is aortic atherosclerosis, as well as atherosclerosis of the great vessels of the mediastinum and the coronary arteries, including calcified atherosclerotic plaque in the left main, left anterior descending, left circumflex and right coronary arteries. Severe thickening calcification of the aortic valve. Mild calcifications of the mitral annulus. Mediastinum/Lymph Nodes: No pathologically enlarged mediastinal or hilar lymph nodes. Esophagus is unremarkable in appearance. No axillary lymphadenopathy. Lungs/Pleura: No suspicious appearing pulmonary nodules or masses are noted. No acute consolidative airspace disease. Moderate bilateral pleural effusions with some mild dependent subsegmental atelectasis. Mild diffuse bronchial wall thickening with mild centrilobular and paraseptal emphysema. Musculoskeletal/Soft Tissues: There  are no aggressive appearing lytic or blastic lesions noted in the visualized portions of the skeleton. CTA ABDOMEN AND PELVIS FINDINGS Hepatobiliary: 2.3 cm low-attenuation lesion in segment 7 of the liver, increased in size compared to remote prior study from 2016, compatible with a slow early growing simple cyst. No other suspicious cystic or solid hepatic lesions. No intra or extrahepatic biliary ductal dilatation. Status post cholecystectomy. Pancreas: No pancreatic mass. No pancreatic ductal dilatation. No pancreatic or peripancreatic fluid or inflammatory changes. Spleen: Unremarkable. Adrenals/Urinary Tract: Moderate bilateral renal atrophy. Numerous subcentimeter low-attenuation lesions in the kidneys bilaterally, too small to characterize, but statistically likely to represent tiny cysts. Bilateral adrenal glands are normal in appearance. No hydroureteronephrosis. Urinary bladder is normal in appearance. Stomach/Bowel: Normal appearance of the stomach. No pathologic dilatation of small bowel or colon. Numerous colonic diverticulae are noted, particularly in the sigmoid colon, without surrounding inflammatory changes to suggest an acute diverticulitis at this time. Normal appendix. Vascular/Lymphatic: Aortic atherosclerosis with vascular findings and measurements pertinent to potential TAVR procedure, as detailed below. Extending from the medial aspect of the left common femoral artery distally, immediately adjacent to the bifurcation, there is a 1.7 x 0.8 x 0.7 cm focal outpouching (axial image 199 of series 16 and coronal image 51 of series 18), compatible with a pseudoaneurysm. Anterior to the superficial femoral artery (axial image 204 of series 16) there is a large 5.6 x 7.6 x 8.1 cm low-intermediate attenuation lesion, likely to represent a resolving hematoma. Multiple prominent borderline enlarged retroperitoneal lymph nodes, nonspecific. Reproductive: Prostate gland and seminal vesicles are  unremarkable in appearance. Other: No significant volume of ascites.  No pneumoperitoneum. Musculoskeletal: There are no aggressive appearing lytic or blastic lesions noted in the visualized portions of the skeleton. VASCULAR MEASUREMENTS PERTINENT TO TAVR: AORTA: Minimal Aortic Diameter-11 x 14 mm Severity of Aortic Calcification-severe RIGHT PELVIS: Right Common Iliac Artery - Minimal Diameter-6.6 x 5.2 mm Tortuosity - mild Calcification-severe Right External Iliac Artery - Minimal Diameter-5.4 x 5.9 mm Tortuosity - mild Calcification-moderate Right Common Femoral Artery - Minimal Diameter-6.1 x 5.3 mm Tortuosity - mild Calcification-moderate LEFT PELVIS: Left Common Iliac Artery - Minimal Diameter-7.0 x 7.0 mm Tortuosity - mild Calcification-severe Left External Iliac Artery - Minimal Diameter-7.5 x 6.7 mm Tortuosity - mild Calcification-mild Left Common Femoral Artery -  Minimal Diameter-8.3 x 6.6 mm Tortuosity-mild Calcification-mild-to-moderate Review of the MIP images confirms the above findings. IMPRESSION: 1. Vascular findings and measurements pertinent to potential TAVR procedure, as detailed above. 2. Large low-intermediate attenuation fluid collection in the left inguinal region likely to represent a resolving hematoma. There is also a small pseudoaneurysm extending off the medial aspect of the distal left common femoral artery, as above. 3. Severe thickening calcification of the aortic valve, compatible with the reported clinical history of severe aortic stenosis. 4. Cardiomegaly with moderate left ventricular dilatation. 5. Aortic atherosclerosis, in addition to left main and 3 vessel coronary artery disease. Please note that although the presence of coronary artery calcium documents the presence of coronary artery disease, the severity of this disease and any potential stenosis cannot be assessed on this non-gated CT examination. Assessment for potential risk factor modification, dietary therapy or  pharmacologic therapy may be warranted, if clinically indicated. 6. Moderate bilateral renal atrophy. 7. Colonic diverticulosis without evidence of acute diverticulitis at this time. 8. Additional incidental findings, as above. Electronically Signed   By: Vinnie Langton M.D.   On: 09/30/2018 14:13   Ct Angio Abd/pel W/ And/or W/o  Result Date: 09/30/2018 CLINICAL DATA:  68 year old male with history of severe aortic stenosis. Preprocedural study prior to potential transcatheter aortic valve replacement (TAVR) procedure. EXAM: CT ANGIOGRAPHY CHEST, ABDOMEN AND PELVIS TECHNIQUE: Multidetector CT imaging through the chest, abdomen and pelvis was performed using the standard protocol during bolus administration of intravenous contrast. Multiplanar reconstructed images and MIPs were obtained and reviewed to evaluate the vascular anatomy. CONTRAST:  169mL OMNIPAQUE IOHEXOL 350 MG/ML SOLN COMPARISON:  PET-CT 10/14/2014. CT the abdomen and pelvis 09/08/2014. FINDINGS: CTA CHEST FINDINGS Cardiovascular: Heart size is enlarged with moderate left ventricular dilatation. There is no significant pericardial fluid, thickening or pericardial calcification. There is aortic atherosclerosis, as well as atherosclerosis of the great vessels of the mediastinum and the coronary arteries, including calcified atherosclerotic plaque in the left main, left anterior descending, left circumflex and right coronary arteries. Severe thickening calcification of the aortic valve. Mild calcifications of the mitral annulus. Mediastinum/Lymph Nodes: No pathologically enlarged mediastinal or hilar lymph nodes. Esophagus is unremarkable in appearance. No axillary lymphadenopathy. Lungs/Pleura: No suspicious appearing pulmonary nodules or masses are noted. No acute consolidative airspace disease. Moderate bilateral pleural effusions with some mild dependent subsegmental atelectasis. Mild diffuse bronchial wall thickening with mild centrilobular and  paraseptal emphysema. Musculoskeletal/Soft Tissues: There are no aggressive appearing lytic or blastic lesions noted in the visualized portions of the skeleton. CTA ABDOMEN AND PELVIS FINDINGS Hepatobiliary: 2.3 cm low-attenuation lesion in segment 7 of the liver, increased in size compared to remote prior study from 2016, compatible with a slow early growing simple cyst. No other suspicious cystic or solid hepatic lesions. No intra or extrahepatic biliary ductal dilatation. Status post cholecystectomy. Pancreas: No pancreatic mass. No pancreatic ductal dilatation. No pancreatic or peripancreatic fluid or inflammatory changes. Spleen: Unremarkable. Adrenals/Urinary Tract: Moderate bilateral renal atrophy. Numerous subcentimeter low-attenuation lesions in the kidneys bilaterally, too small to characterize, but statistically likely to represent tiny cysts. Bilateral adrenal glands are normal in appearance. No hydroureteronephrosis. Urinary bladder is normal in appearance. Stomach/Bowel: Normal appearance of the stomach. No pathologic dilatation of small bowel or colon. Numerous colonic diverticulae are noted, particularly in the sigmoid colon, without surrounding inflammatory changes to suggest an acute diverticulitis at this time. Normal appendix. Vascular/Lymphatic: Aortic atherosclerosis with vascular findings and measurements pertinent to potential TAVR procedure, as detailed below.  Extending from the medial aspect of the left common femoral artery distally, immediately adjacent to the bifurcation, there is a 1.7 x 0.8 x 0.7 cm focal outpouching (axial image 199 of series 16 and coronal image 51 of series 18), compatible with a pseudoaneurysm. Anterior to the superficial femoral artery (axial image 204 of series 16) there is a large 5.6 x 7.6 x 8.1 cm low-intermediate attenuation lesion, likely to represent a resolving hematoma. Multiple prominent borderline enlarged retroperitoneal lymph nodes, nonspecific.  Reproductive: Prostate gland and seminal vesicles are unremarkable in appearance. Other: No significant volume of ascites.  No pneumoperitoneum. Musculoskeletal: There are no aggressive appearing lytic or blastic lesions noted in the visualized portions of the skeleton. VASCULAR MEASUREMENTS PERTINENT TO TAVR: AORTA: Minimal Aortic Diameter-11 x 14 mm Severity of Aortic Calcification-severe RIGHT PELVIS: Right Common Iliac Artery - Minimal Diameter-6.6 x 5.2 mm Tortuosity - mild Calcification-severe Right External Iliac Artery - Minimal Diameter-5.4 x 5.9 mm Tortuosity - mild Calcification-moderate Right Common Femoral Artery - Minimal Diameter-6.1 x 5.3 mm Tortuosity - mild Calcification-moderate LEFT PELVIS: Left Common Iliac Artery - Minimal Diameter-7.0 x 7.0 mm Tortuosity - mild Calcification-severe Left External Iliac Artery - Minimal Diameter-7.5 x 6.7 mm Tortuosity - mild Calcification-mild Left Common Femoral Artery - Minimal Diameter-8.3 x 6.6 mm Tortuosity-mild Calcification-mild-to-moderate Review of the MIP images confirms the above findings. IMPRESSION: 1. Vascular findings and measurements pertinent to potential TAVR procedure, as detailed above. 2. Large low-intermediate attenuation fluid collection in the left inguinal region likely to represent a resolving hematoma. There is also a small pseudoaneurysm extending off the medial aspect of the distal left common femoral artery, as above. 3. Severe thickening calcification of the aortic valve, compatible with the reported clinical history of severe aortic stenosis. 4. Cardiomegaly with moderate left ventricular dilatation. 5. Aortic atherosclerosis, in addition to left main and 3 vessel coronary artery disease. Please note that although the presence of coronary artery calcium documents the presence of coronary artery disease, the severity of this disease and any potential stenosis cannot be assessed on this non-gated CT examination. Assessment for  potential risk factor modification, dietary therapy or pharmacologic therapy may be warranted, if clinically indicated. 6. Moderate bilateral renal atrophy. 7. Colonic diverticulosis without evidence of acute diverticulitis at this time. 8. Additional incidental findings, as above. Electronically Signed   By: Vinnie Langton M.D.   On: 09/30/2018 14:13   Ct Angio Abdomen Pelvis  W &/or Wo Contrast  Result Date: 09/30/2018 CLINICAL DATA:  68 year old male with history of severe aortic stenosis. Preprocedural study prior to potential transcatheter aortic valve replacement (TAVR) procedure. EXAM: CT ANGIOGRAPHY CHEST, ABDOMEN AND PELVIS TECHNIQUE: Multidetector CT imaging through the chest, abdomen and pelvis was performed using the standard protocol during bolus administration of intravenous contrast. Multiplanar reconstructed images and MIPs were obtained and reviewed to evaluate the vascular anatomy. CONTRAST:  136mL OMNIPAQUE IOHEXOL 350 MG/ML SOLN COMPARISON:  PET-CT 10/14/2014. CT the abdomen and pelvis 09/08/2014. FINDINGS: CTA CHEST FINDINGS Cardiovascular: Heart size is enlarged with moderate left ventricular dilatation. There is no significant pericardial fluid, thickening or pericardial calcification. There is aortic atherosclerosis, as well as atherosclerosis of the great vessels of the mediastinum and the coronary arteries, including calcified atherosclerotic plaque in the left main, left anterior descending, left circumflex and right coronary arteries. Severe thickening calcification of the aortic valve. Mild calcifications of the mitral annulus. Mediastinum/Lymph Nodes: No pathologically enlarged mediastinal or hilar lymph nodes. Esophagus is unremarkable in appearance. No axillary lymphadenopathy.  Lungs/Pleura: No suspicious appearing pulmonary nodules or masses are noted. No acute consolidative airspace disease. Moderate bilateral pleural effusions with some mild dependent subsegmental  atelectasis. Mild diffuse bronchial wall thickening with mild centrilobular and paraseptal emphysema. Musculoskeletal/Soft Tissues: There are no aggressive appearing lytic or blastic lesions noted in the visualized portions of the skeleton. CTA ABDOMEN AND PELVIS FINDINGS Hepatobiliary: 2.3 cm low-attenuation lesion in segment 7 of the liver, increased in size compared to remote prior study from 2016, compatible with a slow early growing simple cyst. No other suspicious cystic or solid hepatic lesions. No intra or extrahepatic biliary ductal dilatation. Status post cholecystectomy. Pancreas: No pancreatic mass. No pancreatic ductal dilatation. No pancreatic or peripancreatic fluid or inflammatory changes. Spleen: Unremarkable. Adrenals/Urinary Tract: Moderate bilateral renal atrophy. Numerous subcentimeter low-attenuation lesions in the kidneys bilaterally, too small to characterize, but statistically likely to represent tiny cysts. Bilateral adrenal glands are normal in appearance. No hydroureteronephrosis. Urinary bladder is normal in appearance. Stomach/Bowel: Normal appearance of the stomach. No pathologic dilatation of small bowel or colon. Numerous colonic diverticulae are noted, particularly in the sigmoid colon, without surrounding inflammatory changes to suggest an acute diverticulitis at this time. Normal appendix. Vascular/Lymphatic: Aortic atherosclerosis with vascular findings and measurements pertinent to potential TAVR procedure, as detailed below. Extending from the medial aspect of the left common femoral artery distally, immediately adjacent to the bifurcation, there is a 1.7 x 0.8 x 0.7 cm focal outpouching (axial image 199 of series 16 and coronal image 51 of series 18), compatible with a pseudoaneurysm. Anterior to the superficial femoral artery (axial image 204 of series 16) there is a large 5.6 x 7.6 x 8.1 cm low-intermediate attenuation lesion, likely to represent a resolving hematoma.  Multiple prominent borderline enlarged retroperitoneal lymph nodes, nonspecific. Reproductive: Prostate gland and seminal vesicles are unremarkable in appearance. Other: No significant volume of ascites.  No pneumoperitoneum. Musculoskeletal: There are no aggressive appearing lytic or blastic lesions noted in the visualized portions of the skeleton. VASCULAR MEASUREMENTS PERTINENT TO TAVR: AORTA: Minimal Aortic Diameter-11 x 14 mm Severity of Aortic Calcification-severe RIGHT PELVIS: Right Common Iliac Artery - Minimal Diameter-6.6 x 5.2 mm Tortuosity - mild Calcification-severe Right External Iliac Artery - Minimal Diameter-5.4 x 5.9 mm Tortuosity - mild Calcification-moderate Right Common Femoral Artery - Minimal Diameter-6.1 x 5.3 mm Tortuosity - mild Calcification-moderate LEFT PELVIS: Left Common Iliac Artery - Minimal Diameter-7.0 x 7.0 mm Tortuosity - mild Calcification-severe Left External Iliac Artery - Minimal Diameter-7.5 x 6.7 mm Tortuosity - mild Calcification-mild Left Common Femoral Artery - Minimal Diameter-8.3 x 6.6 mm Tortuosity-mild Calcification-mild-to-moderate Review of the MIP images confirms the above findings. IMPRESSION: 1. Vascular findings and measurements pertinent to potential TAVR procedure, as detailed above. 2. Large low-intermediate attenuation fluid collection in the left inguinal region likely to represent a resolving hematoma. There is also a small pseudoaneurysm extending off the medial aspect of the distal left common femoral artery, as above. 3. Severe thickening calcification of the aortic valve, compatible with the reported clinical history of severe aortic stenosis. 4. Cardiomegaly with moderate left ventricular dilatation. 5. Aortic atherosclerosis, in addition to left main and 3 vessel coronary artery disease. Please note that although the presence of coronary artery calcium documents the presence of coronary artery disease, the severity of this disease and any potential  stenosis cannot be assessed on this non-gated CT examination. Assessment for potential risk factor modification, dietary therapy or pharmacologic therapy may be warranted, if clinically indicated. 6. Moderate bilateral renal atrophy.  7. Colonic diverticulosis without evidence of acute diverticulitis at this time. 8. Additional incidental findings, as above. Electronically Signed   By: Vinnie Langton M.D.   On: 09/30/2018 14:13    EKG: SR RBBB PAC;s and PVC;s   ASSESSMENT AND PLAN:   Dyspnea:  Multifactorial. Known ischemic DCM with low EF. Severe AS pending TAVR. Missed dialysis today Will likely need dialysis tomorrow. His FTT may reflect some cardiac cachexia given systolic dysfunction and AS. May be best to keep him until Tuesday and dry to get him a bit dryer with dialysis as he has edema and rales on exam. CXR is only mild cephalization. Hct is stable at 33.4 Consider checking TSH per primary team COVID test pending He is on midodrine for hypotension on dialysis which has limited any other CHF Rx Has also caused premature termination of some of his dialysis sessions  Will let TAVR team know of his admission and they will likely follow him starting Monday   Signed: Jenkins Rouge 10/17/2018, 6:18 PM

## 2018-10-17 NOTE — H&P (Signed)
History and Physical    Jose Garrett OEH:[id number] DOB: Jun 14, 1950 DOA: 10/17/2018  PCP: [name], MD  Patient coming from: Home  I have personally briefly reviewed patient's old medical records in [place]  Chief Complaint: Dyspnea  HPI: Jose Garrett is a 68 y.o. male with medical history significant for chronic combined systolic and diastolic CHF (EF 03-70%), ESRD on MWF HD, CAD, severe aortic stenosis, PAD status post left femoropopliteal bypass, hypertension, hyperlipidemia, thrombocytopenia, and anxiety who presents the ED for evaluation of dyspnea.  Patient states he has baseline shortness of breath however had sudden worsening of dyspnea morning of 10/17/2018.  He states he becomes very short of breath with minimal exertion and has had paroxysmal nocturnal dyspnea.  He has noticed becoming generally weak especially at the end of dialysis sessions and reports that his blood pressure becomes low at the end of the sessions as well.  He has had a cough productive of occasional clear sputum.  He reports generalized weakness and some lightheadedness with activity and states that his balance has been off.  He denies any falls or loss of consciousness.  He denies any chest pain, palpitations, subjective fevers, diaphoresis, chills, abdominal pain.  He no longer makes urine.  He states he is having occasional loose but not watery stools.  ED Course:  Initial vitals showed BP 125/52, pulse 92, RR 20, temp 97.7 Fahrenheit, SPO2 96% on room air.  Labs are notable for WBC 4.2, hemoglobin 10.5, platelets 69,000, sodium 140, potassium 3.6, bicarb 26, BUN 40, creatinine 8.37, high-sensitivity troponin 85 > 77.  SARS-CoV-2 test was negative.  Portable chest x-ray showed enlarged cardiac silhouette with pulmonary venous congestion.  Patient was given aspirin 324 mg in the ED.  Cardiology were consulted and recommended admission for further management prior to potential TAVR procedure.   Nephrology were consulted and will see tomorrow for planned HD.  The hospitalist service was consulted to admit.   Review of Systems: All systems reviewed and are negative except as documented in history of present illness above.   Past Medical History:  Diagnosis Date  . Acute myocardial infarction involving right coronary artery ([place])   . Amyloidosis ([place])   . Anemia   . Anxiety   . Aortic stenosis    severe  . CAD (coronary artery disease)    a. cardiac cath on 11/25/17 with occlusion of the RCA which could have been his event but the RCA could not be engaged. The RCA filled distally from left to right collaterals. There were no severe stenoses in the left coronary system. Medical therapy recommended, started on plavix.  . Chronic combined systolic and diastolic CHF (congestive heart failure) ([place])    a. EF dropped to 35-40% with grade 2 DD by echo 11/2017.  [name] DJD (degenerative joint disease), lumbar   . Dyspnea   . ESRD (end stage renal disease) ([place])    ESRD- HEMO MWF- [facility]  . GERD (gastroesophageal reflux disease)   . H/O seasonal allergies   . Hyperlipidemia   . Hypertension   . Muscle spasms of lower extremity     RLE   " it is because of my back"  . Neuropathy   . Peripheral vascular disease ([place])   . Pneumonia   . PONV (postoperative nausea and vomiting)   . Restless legs   . Wears glasses     Past Surgical History:  Procedure Laterality Date  . ABDOMINAL AORTOGRAM W/LOWER EXTREMITY Bilateral  07/16/2018   Procedure: ABDOMINAL AORTOGRAM W/LOWER EXTREMITY;  Surgeon: Wellington Hampshire, MD;  Location: [facility];  Service: Cardiovascular;  Laterality: Bilateral;  . AMPUTATION Left 08/19/2018   Procedure: AMPUTATION LEFT TOES SECOND AND THIRD;  Surgeon: Waynetta Sandy, MD;  Location: [facility];  Service: Vascular;  Laterality: Left;  . AV FISTULA PLACEMENT Right 12/27/2015   Procedure: Right Arm ARTERIOVENOUS (AV) FISTULA CREATION;  Surgeon:  Angelia Mould, MD;  Location: [facility];  Service: Vascular;  Laterality: Right;  . CATARACT EXTRACTION W/ INTRAOCULAR LENS  IMPLANT, BILATERAL    . COLONOSCOPY    . ERCP W/ SPHICTEROTOMY    . FEMORAL-POPLITEAL BYPASS GRAFT Left 07/22/2018   Procedure: left FEMORAL TO BELOW KNEE POPLITEAL ARTERY BYPASS GREAFT using 24mm removable ring propaten gore graft;  Surgeon: Waynetta Sandy, MD;  Location: [facility];  Service: Vascular;  Laterality: Left;  . IR GENERIC HISTORICAL  01/27/2016   IR FLUORO GUIDE CV LINE RIGHT 01/27/2016 Arne Cleveland, MD [facility]  . IR GENERIC HISTORICAL  01/27/2016   IR US GUIDE VASC ACCESS RIGHT 01/27/2016 Arne Cleveland, MD [facility]  . LAPAROSCOPIC CHOLECYSTECTOMY  03/2011  . LAPAROSCOPIC CHOLECYSTECTOMY    . LEFT HEART CATH AND CORONARY ANGIOGRAPHY N/A 11/25/2017   Procedure: LEFT HEART CATH AND CORONARY ANGIOGRAPHY;  Surgeon: Martinique, Peter M, MD;  Location: [facility];  Service: Cardiovascular;  Laterality: N/A;  . LEFT HEART CATH AND CORONARY ANGIOGRAPHY N/A 07/24/2018   Procedure: LEFT HEART CATH AND CORONARY ANGIOGRAPHY;  Surgeon: Sherren Mocha, MD;  Location: [facility];  Service: Cardiovascular;  Laterality: N/A;  . RENAL BIOPSY, PERCUTANEOUS Right 09/08/2014  . RIGHT/LEFT HEART CATH AND CORONARY ANGIOGRAPHY N/A 03/25/2018   Procedure: RIGHT/LEFT HEART CATH AND CORONARY ANGIOGRAPHY;  Surgeon: Martinique, Peter M, MD;  Location: [facility];  Service: Cardiovascular;  Laterality: N/A;  . TOE AMPUTATION Left 08/19/2018   2nd & 3rd toes  . TONSILLECTOMY  ~ 1960  . ULTRASOUND GUIDANCE FOR VASCULAR ACCESS  03/25/2018   Procedure: Ultrasound Guidance For Vascular Access;  Surgeon: Martinique, Peter M, MD;  Location: [facility];  Service: Cardiovascular;;    Social History:  reports that he quit smoking about 7 years ago. His smoking use included cigarettes. He has a 80.00 pack-year smoking history. He has never used smokeless  tobacco. He reports that he does not drink alcohol or use drugs.  No Known Allergies  Family History  Problem Relation Age of Onset  . Hypertension Mother   . Diabetes Mother   . Heart disease Mother   . Skin cancer Mother   . Hypertension Father   . Diabetes Father   . Diabetes Sister      Prior to Admission medications   Medication Sig Start Date End Date Taking? Authorizing Provider  acetaminophen (TYLENOL) 500 MG tablet Take 500-1,000 mg by mouth daily as needed for moderate pain.    Yes [provider]  aspirin EC 81 MG tablet Take 1 tablet (81 mg total) by mouth daily. Patient taking differently: Take 81 mg by mouth every evening.  11/30/17  Yes Cheryln Manly, NP  atorvastatin (LIPITOR) 80 MG tablet Take 1 tablet (80 mg total) by mouth daily with supper. 08/12/18  Yes Angiulli, Lavon Paganini, PA-C  cetirizine (ZYRTEC) 10 MG tablet Take 10 mg by mouth daily.   Yes [provider]  clopidogrel (PLAVIX) 75 MG tablet Take 1 tablet (75 mg  total) by mouth daily with supper. 08/12/18  Yes Angiulli, Lavon Paganini, PA-C  gabapentin (NEURONTIN) 300 MG capsule Take 1 capsule (300 mg total) by mouth every morning. Patient taking differently: Take 300-600 mg by mouth See admin instructions. Take one capsule (300 mg) by mouth every morning and two capsules (600 mg) at night, may also take two capsules (600 mg) midday as needed for pain 08/13/18  Yes Angiulli, Lavon Paganini, PA-C  LORazepam (ATIVAN) 1 MG tablet Take 1 mg by mouth 2 (two) times daily as needed for anxiety.   Yes [provider]  midodrine (PROAMATINE) 10 MG tablet Take 1 tablet (10 mg total) by mouth every Monday, Wednesday, and Friday with hemodialysis. Patient taking differently: Take 10 mg by mouth See admin instructions. Take one tablet (10 mg) by mouth every Monday, Wednesday, Friday before dialysis 08/13/18  Yes Angiulli, Lavon Paganini, PA-C  multivitamin (RENA-VIT) TABS tablet Take 1 tablet by mouth at bedtime. 08/12/18   Yes Angiulli, Lavon Paganini, PA-C  mupirocin ointment (BACTROBAN) 2 % Apply 1 application topically daily as needed (irritation).    Yes [provider]  oxyCODONE (OXY IR/ROXICODONE) 5 MG immediate release tablet Take 1-2 tablets (5-10 mg total) by mouth every 4 (four) hours as needed for moderate pain. 08/20/18  Yes Laurence Slate M, PA-C  pantoprazole (PROTONIX) 40 MG tablet Take 1 tablet (40 mg total) by mouth daily. Patient taking differently: Take 40 mg by mouth every evening.  10/03/18  Yes Fulp, Cammie, MD  sucroferric oxyhydroxide (VELPHORO) 500 MG chewable tablet Chew 2 tablets (1,000 mg total) by mouth 3 (three) times daily with meals. Patient taking differently: Chew 500-1,000 mg by mouth See admin instructions. Take 2 tablets (1000 mg) by mouth up to three times daily with meals, take 1 tablet (500 mg) with snacks 08/12/18  Yes Angiulli, Lavon Paganini, PA-C  vitamin B-12 (CYANOCOBALAMIN) 1000 MCG tablet Take 1,000 mcg by mouth every evening.    Yes [provider]  lidocaine (LIDODERM) 5 % Place 1 patch onto the skin daily. Remove & Discard patch within 12 hours or as directed by MD 10/09/18   Jamse Arn, MD  metoprolol tartrate (LOPRESSOR) 50 MG tablet Take 1 tablet (50 mg total) by mouth as directed. Patient not taking: Reported on 10/17/2018 09/22/18 09/22/19  Eileen Stanford, PA-C    Physical Exam: Vitals:   10/17/18 1800 10/17/18 1845 10/17/18 1930 10/17/18 2000  BP: 118/63 118/82 102/80 104/72  Pulse: (!) 30 89 (!) 41   Resp: (!) 23 (!) 21 19   Temp:      TempSrc:      SpO2: 93% 94% 95%     Constitutional: Chronically ill-appearing man sitting up at the side of the bed, NAD, calm, comfortable Eyes: PERRL, lids and conjunctivae normal ENMT: Mucous membranes are moist. Posterior pharynx clear of any exudate or lesions.Normal dentition.  Neck: normal, supple, no masses. Respiratory: Faint basilar inspiratory crackles. Normal respiratory effort. No accessory muscle  use.  Cardiovascular: Regular rate and rhythm, systolic murmur present.  Trace bilateral lower extremity edema.  aVF RUE with palpable thrill. Abdomen: no tenderness, no masses palpated. No hepatosplenomegaly. Bowel sounds positive.  Musculoskeletal: no clubbing / cyanosis. No joint deformity upper and lower extremities. Good ROM, no contractures. Normal muscle tone.  Skin: no rashes, lesions, ulcers. No induration Neurologic: CN 2-12 grossly intact. Sensation intact, Strength 5/5 in all 4.  Psychiatric: Normal judgment and insight. Alert and oriented x 3. Normal mood.  Labs on Admission: I have personally reviewed following labs and imaging studies  CBC: Recent Labs  Lab 10/16/18 1045 10/17/18 1318  WBC 4.6 4.2  HGB 9.9* 10.5*  HCT 31.1* 33.4*  MCV 106.5* 108.1*  PLT 69* 69*   Basic Metabolic Panel: Recent Labs  Lab 10/16/18 1045 10/17/18 1318  NA 138 140  K 3.7 3.6  CL 96* 95*  CO2 26 26  GLUCOSE 98 112*  BUN 26* 40*  CREATININE 6.35* 8.37*  CALCIUM 9.6 9.3   GFR: Estimated Creatinine Clearance: 9 mL/min (A) (by C-G formula based on SCr of 8.37 mg/dL (H)). Liver Function Tests: Recent Labs  Lab 10/16/18 1045  AST 17  ALT 13  ALKPHOS 58  BILITOT 1.1  PROT 7.0  ALBUMIN 3.9   No results for input(s): LIPASE, AMYLASE in the last 168 hours. No results for input(s): AMMONIA in the last 168 hours. Coagulation Profile: Recent Labs  Lab 10/16/18 1045  INR 1.3*   Cardiac Enzymes: No results for input(s): CKTOTAL, CKMB, CKMBINDEX, TROPONINI in the last 168 hours. BNP (last 3 results) No results for input(s): PROBNP in the last 8760 hours. HbA1C: Recent Labs    10/16/18 1045  HGBA1C 5.0   CBG: No results for input(s): GLUCAP in the last 168 hours. Lipid Profile: No results for input(s): CHOL, HDL, LDLCALC, TRIG, CHOLHDL, LDLDIRECT in the last 72 hours. Thyroid Function Tests: No results for input(s): TSH, T4TOTAL, FREET4, T3FREE, THYROIDAB in the last  72 hours. Anemia Panel: No results for input(s): VITAMINB12, FOLATE, FERRITIN, TIBC, IRON, RETICCTPCT in the last 72 hours. Urine analysis:    Component Value Date/Time   COLORURINE AMBER (A) 02/01/2016 1707   APPEARANCEUR CLOUDY (A) 02/01/2016 1707   LABSPEC 1.020 02/01/2016 1707   PHURINE 5.5 02/01/2016 1707   GLUCOSEU 100 (A) 02/01/2016 1707   HGBUR NEGATIVE 02/01/2016 1707   BILIRUBINUR SMALL (A) 02/01/2016 1707   KETONESUR NEGATIVE 02/01/2016 1707   PROTEINUR >300 (A) 02/01/2016 1707   NITRITE NEGATIVE 02/01/2016 1707   LEUKOCYTESUR TRACE (A) 02/01/2016 1707    Radiological Exams on Admission: Dg Chest 2 View  Result Date: 10/16/2018 CLINICAL DATA:  Valve replacement shortness of breath with exertion EXAM: CHEST - 2 VIEW COMPARISON:  07/23/2018 FINDINGS: Mild cardiomegaly with prominent central pulmonary vessels. Tiny pleural effusions. No focal consolidation. Aortic atherosclerosis. No pneumothorax. IMPRESSION: 1. Cardiomegaly with tiny pleural effusions. Electronically Signed   By: Donavan Foil M.D.   On: 10/16/2018 15:39   Dg Chest Portable 1 View  Result Date: 10/17/2018 CLINICAL DATA:  Intermittent chest pain with exertion. EXAM: PORTABLE CHEST 1 VIEW COMPARISON:  October 16, 2018 FINDINGS: No pneumothorax. Stable cardiomegaly. The hila and mediastinum are unremarkable. No pulmonary nodules or masses. No focal infiltrates. No overt edema. Mild pulmonary venous congestion not excluded. IMPRESSION: Cardiomegaly. Suspected mild pulmonary venous congestion. No other abnormalities. Electronically Signed   By: Dorise Bullion III M.D   On: 10/17/2018 16:52    EKG: Independently reviewed.  Sinus rhythm with frequent PVCs, RBBB and LPFB, similar to prior.  Assessment/Plan Principal Problem:   Dyspnea Active Problems:   Essential hypertension, benign   ESRD (end stage renal disease) on dialysis ([facility])   Thrombocytopenia ([facility])   Chronic combined systolic and diastolic congestive  heart failure ([facility])   Coronary artery disease involving native coronary artery of native heart without angina pectoris   Aortic stenosis   Anxiety  Jose Garrett is a 68 y.o. male with medical history  significant for chronic combined systolic and diastolic CHF (EF 97-98%), ESRD on MWF HD, CAD, severe aortic stenosis, PAD status post left femoropopliteal bypass, hypertension, hyperlipidemia, thrombocytopenia, and anxiety who is admitted with progressive dyspnea.   Dyspnea: This is likely multifactorial from symptomatic aortic stenosis, pulmonary edema, and progressive deconditioning from his chronic comorbidities.  Cardiology and nephrology following. -Plan for volume control with HD per nephrology  ESRD on MWF HD: Nephrology consulted by EDP, plan for HD tomorrow. -Continue midodrine with HD  Chronic combined systolic and diastolic CHF: EF 92-11% by TTE 07/25/2018.  He does have rales and edema on both legs.  Volume is controlled by dialysis.  He is previously on Lopressor, however states he is not taking it.  Consider adding back if BP allows.  Severe aortic stenosis: Has plans for TAVR on 10/21/2018.  Cardiology has seen patient and will notify TAVR team will likely see patient on Monday.  CAD: Severe single-vessel CAD with chronic total occlusion of the RCA noted on left heart cath 07/24/2018.  Patient denies any chest pain.  High-sensitivity troponin minimally elevated and has trended down on repeat. -Continue aspirin, Plavix, atorvastatin  PAD status post left femoropopliteal bypass: Continue aspirin, Plavix, atorvastatin  Hypertension: No longer on antihypertensives.  Actually becomes hypotensive with dialysis per patient and uses midodrine prior to HD sessions.  Thrombocytopenia: Chronic and stable compared to recent labs.  He denies any obvious bleeding.  Monitor closely while on aspirin and Plavix.  Hyperlipidemia: Continue atorvastatin.  Generalized  weakness/deconditioning: Due to his chronic comorbidities and likely cardiac cachexia. -PT/OT eval  DVT prophylaxis: SCDs Code Status: Full code, confirmed with patient Family Communication: Discussed with patient, he has discussed with daughter Disposition Plan: Pending clinical progress Consults called: Cardiology, nephrology Admission status: Admit - It is my clinical opinion that admission to INPATIENT is reasonable and necessary because of the expectation that this patient will require hospital care that crosses at least 2 midnights to treat this condition based on the medical complexity of the problems presented.  Given the aforementioned information, the predictability of an adverse outcome is felt to be significant.      Zada Finders MD [facility]  If 7PM-7AM, please contact night-coverage [email or web address]  10/17/2018, 9:33 PM

## 2018-10-17 NOTE — Progress Notes (Signed)
Anesthesia Chart Review:  Case: [id number] Date/Time: 10/21/18 1030   Procedures:      TRANSCATHETER AORTIC VALVE REPLACEMENT, TRANSFEMORAL (N/A Chest)     POSSIBLE TRANSCATHETER AORTIC VALVE REPLACEMENT, LEFT SUBCLAVIAN (N/A Chest)     TRANSESOPHAGEAL ECHOCARDIOGRAM (TEE) (N/A )   Anesthesia type: General   Pre-op diagnosis: Severe Aortic Stenosis   Location: [facility] OR ROOM 16 / [place] [facility]   Surgeon: Sherren Mocha, MD      DISCUSSION: Patient is a 68 year old male scheduled for the above procedure.   History includes former smoker (quit 2013), post-operative N/V, AL amyloidosis (with kidney involvement, diagnosed 09/28/14), ESRD (HD MWF; right radiocephalic AVF 27/25/36), anemia, CAD (NSTEMI 11/25/17, unable to engage RCA, medical therapy; elevated troponin with stable CAD, medical therapy 03/2018 and 07/2018), aortic stenosis (moderate-severe 07/2018), chronic combined systolic and diastolic CHF (diagnosed 08/4401), HTN, HLD, PAD (s/p left CFA-below knee popliteal bypass 6 mm PTFE 07/22/18; s/p left 2nd, 3rd toe amputations 08/19/18), dyspnea.  Per cardiology instructions, continue Plavix except day of surgery.   Abnormal labs communicated with cardiology. I've asked staff to let me know if any additional recommendations. He will get an ISTAT or equivalent on the day of surgery due to ESRD.  Presurgical COVID-19 test is scheduled for 10/17/2018.   VS: BP (!) 95/50   Pulse 91   Temp 37.1 C   Resp 18   Ht 5\' 10"  (1.778 m)   Wt 84.4 kg   SpO2 96%   BMI 26.69 kg/m    PROVIDERS: [name], MD is PCP  Lauree Chandler, MD is cardiologist Kathlyn Sacramento, MD is [place] cardiologist Servando Snare, MD Is vascular surgeon Nephrologist is with [place] (MWF at [facility] location) Sullivan Lone, MD is hematologist   LABS: Preoperative labs noted. See DISCUSSION. Given ESRD, he will need an ISTAT or equivalent on the day of surgery. Unable to do UA due to patient being anuric.   (all labs ordered are listed, but only abnormal results are displayed)  Labs Reviewed  APTT - Abnormal; Notable for the following components:      Result Value   aPTT 37 (*)    All other components within normal limits  BLOOD GAS, ARTERIAL - Abnormal; Notable for the following components:   pH, Arterial 7.508 (*)    pO2, Arterial 72.1 (*)    Bicarbonate 29.2 (*)    Acid-Base Excess 5.9 (*)    All other components within normal limits  BRAIN NATRIURETIC PEPTIDE - Abnormal; Notable for the following components:   B Natriuretic Peptide 3,896.1 (*)    All other components within normal limits  CBC - Abnormal; Notable for the following components:   RBC 2.92 (*)    Hemoglobin 9.9 (*)    HCT 31.1 (*)    MCV 106.5 (*)    RDW 17.2 (*)    Platelets 69 (*)    All other components within normal limits  COMPREHENSIVE METABOLIC PANEL - Abnormal; Notable for the following components:   Chloride 96 (*)    BUN 26 (*)    Creatinine, Ser 6.35 (*)    GFR calc non Af Amer 8 (*)    GFR calc Af Amer 10 (*)    Anion gap 16 (*)    All other components within normal limits  PROTIME-INR - Abnormal; Notable for the following components:   Prothrombin Time 16.3 (*)    INR 1.3 (*)    All other components within normal limits  SURGICAL PCR SCREEN  HEMOGLOBIN A1C  TYPE AND SCREEN     IMAGES: CXR 10/16/18: FINDINGS: Mild cardiomegaly with prominent central pulmonary vessels. Tiny pleural effusions. No focal consolidation. Aortic atherosclerosis. No pneumothorax. IMPRESSION: Cardiomegaly with tiny pleural effusions.   EKG: 10/16/18: Sinus rhythm with frequent and consecutive Premature ventricular complexes Right bundle branch block Left posterior fascicular block ** Bifascicular block ** T wave abnormality, consider inferolateral ischemia Abnormal ECG No significant change since last tracing   CV:CV: CT coronary 09/30/18: IMPRESSION: 1. Trileaflet aortic valve with severely thickened and  calcified leaflets with severely restricted leaflet opening and no calcifications extending into the LVOT. Aortic valve calcium score 2859 consistent with severe aortic stenosis. Annular measurements suitable for delivery of a 26 mm Edwards-SAPIEN 3 valve. 2. Sufficient coronary to annulus distance. 3. Optimum Fluoroscopic Angle for Delivery:  LAO 6 CAU 7. 4. No thrombus in the left atrial appendage.   Echo 07/25/18: IMPRESSIONS 1. The left ventricle has moderately reduced systolic function, with an ejection fraction of 35-40%. The cavity size was normal. There is moderately increased left ventricular wall thickness. Left ventricular diastolic Doppler parameters are consistent  with impaired relaxation. Elevated left atrial and left ventricular end-diastolic pressures The E/e' is >15. 2. Moderate akinesis of the left ventricular, entire inferior wall. 3. The right ventricle has normal systolic function. The cavity was normal. There is no increase in right ventricular wall thickness. 4. The mitral valve is abnormal. Mild thickening of the mitral valve leaflet. There is mild mitral annular calcification present. 5. The tricuspid valve is grossly normal. 6. The aortic valve is tricuspid. Moderate calcification of the aortic valve. Aortic valve regurgitation is mild by color flow Doppler. Moderate-severe stenosis of the aortic valve.  AV Area (Vmax):   0.87 cm      AV Area (Vmean):  0.82 cm      AV Area (VTI):    0.76 cm      AV Vmax:          367.67 cm/s   AV Vmean:         243.000 cm/s  AV VTI:           0.804 m       AV Peak Grad:     54.1 mmHg     AV Mean Grad:     30.0 mmHg    7. The inferior vena cava was dilated in size with <50% respiratory variability. 8. The interatrial septum was not assessed. 9. When compared to the prior study: 03/24/2018: LVEF 40-45%, moderate aortic stenosis - mean gradient 30 mmHg. (Comparison 03/24/18: LVEF: 40-45%, moderate AS; 12/19/17:  LVEF 65-70%, moderate AS; 11/26/17: LVEF 35-40%, moderate AS)   Cardiac cath 07/24/18: - Left Main  Vessel is normal in caliber. Vessel is angiographically normal.  - Left Anterior Descending  The proximal vessel has an eccentric 40% stenosis unchanged from past cath studies. The vessel is diffusely calcified.  Prox LAD lesion 40% stenosed  Prox LAD lesion is 40% stenosed. The lesion is moderately calcified.  - Ramus Intermedius  Large branch with no high-grade stenosis  Ramus lesion 40% stenosed  Ramus lesion is 40% stenosed.  - Left Circumflex  Ost Cx to Prox Cx lesion 65% stenosed  Ost Cx to Prox Cx lesion is 65% stenosed.  - Right Coronary Artery  Ost RCA to Mid RCA lesion 100% stenosed  Ost RCA to Mid RCA lesion is 100% stenosed.  Right Posterior Atrioventricular Branch  - Collaterals  Post Atrio filled by collaterals from 2nd Sept.  Conclusion: 1. Severe single vessel CAD with chronic total occlusion of the RCA, collateralized with L--->R collaterals 2. Mild calcific nonobstructive LCA disease, anatomy unchanged from previous cath studies 3. At least moderate AS, possibly severe LFLG - will check echo Recommend: medical therapy, no target for PCI   Carotid US 12/31/17: Summary: - Right Carotid: Velocities in the right ICA are consistent with a 1-39% stenosis. - Left Carotid: Velocities in the left ICA are consistent with a 1-39% stenosis. Non-hemodynamically significant plaque noted in the CCA. - Vertebrals: Left vertebral artery demonstrates antegrade flow. Right vertebral artery demonstrates retrograde flow suggesting right subclavian steal. - Subclavians: Right subclavian artery was stenotic. Right subclavian artery flow was disturbed. Normal flow hemodynamics were seen in the left subclavian artery. 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 The retrograde flow noted in the right vertebral artery suggest subclavian artery stenosis; right subclavian artery steal. - The flow in the right brachial artery is normal for a AVF. The left brachial artey is normal and biphasic. (Findings reviewed by Melina Copa, PA-C and Dr. Angelena Form. Blood flow changes in his subclavian artery may be related to fistula or plaque. Will continue to follow.)    Past Medical History:  Diagnosis Date  . Acute myocardial infarction involving right coronary artery ([place])   . Amyloidosis ([place])   . Anemia   . Anxiety   . Aortic stenosis    severe  . CAD (coronary artery disease)    a. cardiac cath on 11/25/17 with occlusion of the RCA which could have been his event but the RCA could not be engaged. The RCA filled distally from left to right collaterals. There were no severe stenoses in the left coronary system. Medical therapy recommended, started on plavix.  . Chronic combined systolic and diastolic CHF (congestive heart failure) ([place])    a. EF dropped to 35-40% with grade 2 DD by echo 11/2017.  [name] DJD (degenerative joint disease), lumbar   . Dyspnea   . ESRD (end stage renal disease) ([place])    ESRD- HEMO MWF- [facility]  . GERD (gastroesophageal reflux disease)   . H/O seasonal allergies   . Hyperlipidemia   . Hypertension   . Muscle spasms of lower extremity     RLE   " it is because of my back"  . Neuropathy   . Peripheral vascular disease ([name])   . Pneumonia   . PONV (postoperative nausea and vomiting)   . Restless legs   . Wears glasses     Past Surgical History:  Procedure Laterality Date  . ABDOMINAL AORTOGRAM W/LOWER EXTREMITY Bilateral 07/16/2018   Procedure: ABDOMINAL AORTOGRAM W/LOWER EXTREMITY;  Surgeon: Wellington Hampshire, MD;  Location: [place] [facility];  Service: Cardiovascular;  Laterality: Bilateral;  . AMPUTATION Left 08/19/2018   Procedure:  AMPUTATION LEFT TOES SECOND AND THIRD;  Surgeon: Waynetta Sandy, MD;  Location: [place];  Service: Vascular;  Laterality: Left;  . AV FISTULA PLACEMENT Right 12/27/2015   Procedure: Right Arm ARTERIOVENOUS (AV) FISTULA CREATION;  Surgeon: Angelia Mould, MD;  Location: [place];  Service: Vascular;  Laterality: Right;  . CATARACT EXTRACTION W/ INTRAOCULAR LENS  IMPLANT, BILATERAL    . COLONOSCOPY    . ERCP W/ SPHICTEROTOMY    . FEMORAL-POPLITEAL BYPASS GRAFT Left 07/22/2018   Procedure: left FEMORAL TO BELOW KNEE POPLITEAL ARTERY BYPASS GREAFT  using 16mm removable ring propaten gore graft;  Surgeon: Waynetta Sandy, MD;  Location: [place];  Service: Vascular;  Laterality: Left;  . IR GENERIC HISTORICAL  01/27/2016   IR FLUORO GUIDE CV LINE RIGHT 01/27/2016 Arne Cleveland, MD [facility]  . IR GENERIC HISTORICAL  01/27/2016   IR US GUIDE VASC ACCESS RIGHT 01/27/2016 Arne Cleveland, MD [facility]  . LAPAROSCOPIC CHOLECYSTECTOMY  03/2011  . LAPAROSCOPIC CHOLECYSTECTOMY    . LEFT HEART CATH AND CORONARY ANGIOGRAPHY N/A 11/25/2017   Procedure: LEFT HEART CATH AND CORONARY ANGIOGRAPHY;  Surgeon: Martinique, Peter M, MD;  Location: [place] [facility];  Service: Cardiovascular;  Laterality: N/A;  . LEFT HEART CATH AND CORONARY ANGIOGRAPHY N/A 07/24/2018   Procedure: LEFT HEART CATH AND CORONARY ANGIOGRAPHY;  Surgeon: Sherren Mocha, MD;  Location: [place] [facility];  Service: Cardiovascular;  Laterality: N/A;  . RENAL BIOPSY, PERCUTANEOUS Right 09/08/2014  . RIGHT/LEFT HEART CATH AND CORONARY ANGIOGRAPHY N/A 03/25/2018   Procedure: RIGHT/LEFT HEART CATH AND CORONARY ANGIOGRAPHY;  Surgeon: Martinique, Peter M, MD;  Location: [place] [facility];  Service: Cardiovascular;  Laterality: N/A;  . TOE AMPUTATION Left 08/19/2018   2nd & 3rd toes  . TONSILLECTOMY  ~ 1960  . ULTRASOUND GUIDANCE FOR VASCULAR ACCESS  03/25/2018   Procedure: Ultrasound Guidance For Vascular Access;  Surgeon: Martinique,  Peter M, MD;  Location: [place] [facility];  Service: Cardiovascular;;    MEDICATIONS: . acetaminophen (TYLENOL) 500 MG tablet  . aspirin EC 81 MG tablet  . atorvastatin (LIPITOR) 80 MG tablet  . cetirizine (ZYRTEC) 10 MG tablet  . clopidogrel (PLAVIX) 75 MG tablet  . gabapentin (NEURONTIN) 300 MG capsule  . lidocaine (LIDODERM) 5 %  . LORazepam (ATIVAN) 1 MG tablet  . metoprolol tartrate (LOPRESSOR) 50 MG tablet  . midodrine (PROAMATINE) 10 MG tablet  . multivitamin (RENA-VIT) TABS tablet  . mupirocin ointment (BACTROBAN) 2 %  . oxyCODONE (OXY IR/ROXICODONE) 5 MG immediate release tablet  . pantoprazole (PROTONIX) 40 MG tablet  . sucroferric oxyhydroxide (VELPHORO) 500 MG chewable tablet  . vitamin B-12 (CYANOCOBALAMIN) 1000 MCG tablet   No current facility-administered medications for this encounter.     Myra Gianotti, PA-C Surgical Short Stay/Anesthesiology [facility] Phone [phone] [facility] Phone [phone] 10/17/2018 11:42 AM

## 2018-10-17 NOTE — ED Provider Notes (Signed)
[facility] EMERGENCY DEPARTMENT Provider Note   CSN: [id number] Arrival date & time: 10/17/18  1309     History   Chief Complaint Chief Complaint  Patient presents with  . Shortness of Breath    HPI Jose Garrett is a 68 y.o. male.     HPI  Patient is a 68 year old male with past medical history of CAD, NSTEMI status post RCA stent in September 2019, ESRD on dialysis Monday Wednesday Friday, PAD, hypertension, critical aortic stenosis, amyloidosis presenting for shortness of breath and generalized malaise.  Patient reports a multi-month history of the symptoms.  He reports that he has had progressively worsening shortness of breath and generalized weakness for 2 months.  Patient does report that the symptoms get worse with exertion.  He denies any active chest pain.  He is unable to describe if he got particularly worse this week, but reports that he felt too unwell to go to dialysis and came to the emergency department.  He denies fevers, chills, cough, hemoptysis or wheezing.  He denies abdominal pain, nausea, vomiting.  He does report slight increase in lower extremity edema.    Past Medical History:  Diagnosis Date  . Acute myocardial infarction involving right coronary artery ([place])   . Amyloidosis ([place])   . Anemia   . Anxiety   . Aortic stenosis    severe  . CAD (coronary artery disease)    a. cardiac cath on 11/25/17 with occlusion of the RCA which could have been his event but the RCA could not be engaged. The RCA filled distally from left to right collaterals. There were no severe stenoses in the left coronary system. Medical therapy recommended, started on plavix.  . Chronic combined systolic and diastolic CHF (congestive heart failure) ([place])    a. EF dropped to 35-40% with grade 2 DD by echo 11/2017.  [name] DJD (degenerative joint disease), lumbar   . Dyspnea   . ESRD (end stage renal disease) ([place])    ESRD- HEMO MWF- [facility]  . GERD  (gastroesophageal reflux disease)   . H/O seasonal allergies   . Hyperlipidemia   . Hypertension   . Muscle spasms of lower extremity     RLE   " it is because of my back"  . Neuropathy   . Peripheral vascular disease ([place])   . Pneumonia   . PONV (postoperative nausea and vomiting)   . Restless legs   . Wears glasses     Patient Active Problem List   Diagnosis Date Noted  . Aortic valve stenosis 10/09/2018  . Left foot infection 09/12/2018  . Leukocytosis   . Chronic diastolic congestive heart failure ([place])   . Gastroesophageal reflux disease   . Coronary artery disease involving native coronary artery of native heart without angina pectoris   . Drug induced constipation   . Obstipation   . Acute on chronic anemia   . Thrombocytopenia ([place])   . Chronic combined systolic and diastolic congestive heart failure ([place])   . ESRD on dialysis ([place])   . Hemodialysis-associated hypotension   . Acute blood loss anemia   . Debility 07/28/2018  . PAD (peripheral artery disease) ([place]) 07/22/2018  . Shortness of breath 03/25/2018  . Chest pain   . Dyspnea 03/23/2018  . Acute on chronic combined systolic and diastolic CHF (congestive heart failure) ([place]) 11/30/2017  . Non-ST elevation (NSTEMI) myocardial infarction ([place])   . Elevated troponin 11/25/2017  . Respiratory failure ([place])   .  Neuropathy 03/14/2017  . Fever   . CKD (chronic kidney disease) stage 5, GFR less than 15 ml/min (HCC)   . ESRD (end stage renal disease) on dialysis ([place])   . Acute renal failure superimposed on chronic kidney disease ([place])   . Hypervolemia   . Acute diastolic (congestive) heart failure ([place]) 01/23/2016  . Fluid overload, unspecified 01/23/2016  . Acute pulmonary edema ([place]) 01/23/2016  . Skin tear of right forearm without complication 26/71/2458  . Iron deficiency anemia 11/22/2015  . Nonrheumatic aortic valve stenosis 08/22/2015  . Chronic kidney disease with dialysis modality undecided, stage 5  ([place]): Progressive 08/01/2015  . B12 deficiency 07/19/2015  . Seasonal allergies 06/30/2015  . Back pain 03/18/2015  . Hip pain 02/18/2015  . AL amyloidosis ([place]) 09/25/2014  . Anemia of chronic disease 09/25/2014  . Abnormal bruising 09/25/2014  . Renal hematoma, right 09/08/2014  . Chronic diastolic heart failure, NYHA class 1 ([place]) 09/08/2014  . Hematuria   . Proteinuria   . Vitamin D deficiency 01/21/2014  . Hypertriglyceridemia 01/21/2014  . Essential hypertension, benign 11/06/2013  . Dependent edema 11/06/2013  . DOE (dyspnea on exertion) 11/06/2013  . Other malaise and fatigue 11/06/2013    Past Surgical History:  Procedure Laterality Date  . ABDOMINAL AORTOGRAM W/LOWER EXTREMITY Bilateral 07/16/2018   Procedure: ABDOMINAL AORTOGRAM W/LOWER EXTREMITY;  Surgeon: Wellington Hampshire, MD;  Location: [facility];  Service: Cardiovascular;  Laterality: Bilateral;  . AMPUTATION Left 08/19/2018   Procedure: AMPUTATION LEFT TOES SECOND AND THIRD;  Surgeon: Waynetta Sandy, MD;  Location: [facility];  Service: Vascular;  Laterality: Left;  . AV FISTULA PLACEMENT Right 12/27/2015   Procedure: Right Arm ARTERIOVENOUS (AV) FISTULA CREATION;  Surgeon: Angelia Mould, MD;  Location: [facility];  Service: Vascular;  Laterality: Right;  . CATARACT EXTRACTION W/ INTRAOCULAR LENS  IMPLANT, BILATERAL    . COLONOSCOPY    . ERCP W/ SPHICTEROTOMY    . FEMORAL-POPLITEAL BYPASS GRAFT Left 07/22/2018   Procedure: left FEMORAL TO BELOW KNEE POPLITEAL ARTERY BYPASS GREAFT using 80mm removable ring propaten gore graft;  Surgeon: Waynetta Sandy, MD;  Location: [facility];  Service: Vascular;  Laterality: Left;  . IR GENERIC HISTORICAL  01/27/2016   IR FLUORO GUIDE CV LINE RIGHT 01/27/2016 Arne Cleveland, MD [facility]  . IR GENERIC HISTORICAL  01/27/2016   IR US GUIDE VASC ACCESS RIGHT 01/27/2016 Arne Cleveland, MD [facility]  . LAPAROSCOPIC CHOLECYSTECTOMY  03/2011  . LAPAROSCOPIC  CHOLECYSTECTOMY    . LEFT HEART CATH AND CORONARY ANGIOGRAPHY N/A 11/25/2017   Procedure: LEFT HEART CATH AND CORONARY ANGIOGRAPHY;  Surgeon: Martinique, Peter M, MD;  Location: [facility];  Service: Cardiovascular;  Laterality: N/A;  . LEFT HEART CATH AND CORONARY ANGIOGRAPHY N/A 07/24/2018   Procedure: LEFT HEART CATH AND CORONARY ANGIOGRAPHY;  Surgeon: Sherren Mocha, MD;  Location: [facility];  Service: Cardiovascular;  Laterality: N/A;  . RENAL BIOPSY, PERCUTANEOUS Right 09/08/2014  . RIGHT/LEFT HEART CATH AND CORONARY ANGIOGRAPHY N/A 03/25/2018   Procedure: RIGHT/LEFT HEART CATH AND CORONARY ANGIOGRAPHY;  Surgeon: Martinique, Peter M, MD;  Location: [facility];  Service: Cardiovascular;  Laterality: N/A;  . TOE AMPUTATION Left 08/19/2018   2nd & 3rd toes  . TONSILLECTOMY  ~ 1960  . ULTRASOUND GUIDANCE FOR VASCULAR ACCESS  03/25/2018   Procedure: Ultrasound Guidance For Vascular Access;  Surgeon: Martinique, Peter M, MD;  Location: [facility];  Service: Cardiovascular;;  Home Medications    Prior to Admission medications   Medication Sig Start Date End Date Taking? Authorizing Provider  acetaminophen (TYLENOL) 500 MG tablet Take 500-1,000 mg by mouth daily as needed for moderate pain.     [provider]  aspirin EC 81 MG tablet Take 1 tablet (81 mg total) by mouth daily. 11/30/17   Cheryln Manly, NP  atorvastatin (LIPITOR) 80 MG tablet Take 1 tablet (80 mg total) by mouth daily with supper. 08/12/18   Angiulli, Lavon Paganini, PA-C  cetirizine (ZYRTEC) 10 MG tablet Take 10 mg by mouth daily.    [provider]  clopidogrel (PLAVIX) 75 MG tablet Take 1 tablet (75 mg total) by mouth daily with supper. 08/12/18   Angiulli, Lavon Paganini, PA-C  gabapentin (NEURONTIN) 300 MG capsule Take 1 capsule (300 mg total) by mouth every morning. Patient taking differently: Take 300-600 mg by mouth See admin instructions. Take 300 mg by mouth in the morning, and 600 mg  at lunch and dinner 08/13/18   Angiulli, Lavon Paganini, PA-C  lidocaine (LIDODERM) 5 % Place 1 patch onto the skin daily. Remove & Discard patch within 12 hours or as directed by MD 10/09/18   Jamse Arn, MD  LORazepam (ATIVAN) 1 MG tablet Take 1 mg by mouth 2 (two) times daily as needed for anxiety.    [provider]  metoprolol tartrate (LOPRESSOR) 50 MG tablet Take 1 tablet (50 mg total) by mouth as directed. Patient taking differently: Take 50 mg by mouth daily.  09/22/18 09/22/19  Eileen Stanford, PA-C  midodrine (PROAMATINE) 10 MG tablet Take 1 tablet (10 mg total) by mouth every Monday, Wednesday, and Friday with hemodialysis. 08/13/18   Angiulli, Lavon Paganini, PA-C  multivitamin (RENA-VIT) TABS tablet Take 1 tablet by mouth at bedtime. 08/12/18   Angiulli, Lavon Paganini, PA-C  mupirocin ointment (BACTROBAN) 2 % Apply 1 application topically daily as needed (irritation).     [provider]  oxyCODONE (OXY IR/ROXICODONE) 5 MG immediate release tablet Take 1-2 tablets (5-10 mg total) by mouth every 4 (four) hours as needed for moderate pain. 08/20/18   Ulyses Amor, PA-C  pantoprazole (PROTONIX) 40 MG tablet Take 1 tablet (40 mg total) by mouth daily. 10/03/18   Fulp, Cammie, MD  sucroferric oxyhydroxide (VELPHORO) 500 MG chewable tablet Chew 2 tablets (1,000 mg total) by mouth 3 (three) times daily with meals. 08/12/18   Angiulli, Lavon Paganini, PA-C  vitamin B-12 (CYANOCOBALAMIN) 1000 MCG tablet Take 1,000 mcg by mouth daily.    [provider]    Family History Family History  Problem Relation Age of Onset  . Hypertension Mother   . Diabetes Mother   . Heart disease Mother   . Skin cancer Mother   . Hypertension Father   . Diabetes Father   . Diabetes Sister     Social History Social History   Tobacco Use  . Smoking status: Former Smoker    Packs/day: 2.00    Years: 40.00    Pack years: 80.00    Types: Cigarettes    Quit date: 03/19/2011    Years since quitting:  7.5  . Smokeless tobacco: Never Used  Substance Use Topics  . Alcohol use: No    Alcohol/week: 0.0 standard drinks  . Drug use: No    Comment:  "I have used marijuana till the 1990's". Notes that he quit 15 yrs ago     Allergies   Patient has no  known allergies.   Review of Systems Review of Systems  Constitutional: Negative for chills and fever.  HENT: Negative for congestion, rhinorrhea, sinus pain and sore throat.   Eyes: Negative for visual disturbance.  Respiratory: Positive for shortness of breath. Negative for cough and chest tightness.   Cardiovascular: Positive for leg swelling. Negative for chest pain and palpitations.  Gastrointestinal: Negative for abdominal pain, constipation, nausea and vomiting.  Genitourinary: Negative for dysuria and flank pain.  Musculoskeletal: Negative for back pain and myalgias.  Skin: Negative for rash.  Neurological: Positive for weakness. Negative for dizziness, syncope, light-headedness and headaches.       +Generalized weakness     Physical Exam Updated Vital Signs BP (!) 127/56   Pulse (!) 56   Temp 97.7 F (36.5 C) (Oral)   Resp 20   SpO2 99%   Physical Exam Vitals signs and nursing note reviewed.  Constitutional:      General: He is not in acute distress.    Appearance: He is well-developed.     Comments: Patient is generally well-appearing, speaking in full sentences and in no acute distress.  HENT:     Head: Normocephalic and atraumatic.  Eyes:     Conjunctiva/sclera: Conjunctivae normal.     Pupils: Pupils are equal, round, and reactive to light.  Neck:     Musculoskeletal: Normal range of motion and neck supple.  Cardiovascular:     Rate and Rhythm: Normal rate and regular rhythm.     Heart sounds: S1 normal and S2 normal. No murmur.     Comments: Slight JVD noted.  Pulmonary:     Effort: Pulmonary effort is normal.     Breath sounds: Examination of the right-lower field reveals rales. Examination of the  left-lower field reveals rales. Decreased breath sounds and rales present. No wheezing.  Abdominal:     General: There is no distension.     Palpations: Abdomen is soft.     Tenderness: There is no abdominal tenderness. There is no guarding.  Musculoskeletal: Normal range of motion.        General: No deformity.     Right lower leg: Edema present.     Left lower leg: Edema present.     Comments: 2+ pitting edema to level of mid-shin.  Lymphadenopathy:     Cervical: No cervical adenopathy.  Skin:    General: Skin is warm and dry.     Findings: No erythema or rash.  Neurological:     Mental Status: He is alert.     Comments: Cranial nerves grossly intact. Patient moves extremities symmetrically and with good coordination.  Psychiatric:        Behavior: Behavior normal.        Thought Content: Thought content normal.        Judgment: Judgment normal.      ED Treatments / Results  Labs (all labs ordered are listed, but only abnormal results are displayed) Labs Reviewed  BASIC METABOLIC PANEL - Abnormal; Notable for the following components:      Result Value   Chloride 95 (*)    Glucose, Bld 112 (*)    BUN 40 (*)    Creatinine, Ser 8.37 (*)    GFR calc non Af Amer 6 (*)    GFR calc Af Amer 7 (*)    Anion gap 19 (*)    All other components within normal limits  CBC - Abnormal; Notable for the following components:   RBC  3.09 (*)    Hemoglobin 10.5 (*)    HCT 33.4 (*)    MCV 108.1 (*)    RDW 17.2 (*)    Platelets 69 (*)    All other components within normal limits  TROPONIN I (HIGH SENSITIVITY) - Abnormal; Notable for the following components:   Troponin I (High Sensitivity) 85 (*)    All other components within normal limits  TROPONIN I (HIGH SENSITIVITY)    EKG EKG Interpretation  Date/Time:  Friday October 17 2018 18:20:31 EDT Ventricular Rate:  97 PR Interval:    QRS Duration: 155 QT Interval:  329 QTC Calculation: 360 R Axis:   104 Text Interpretation:   Sinus rhythm Paired ventricular premature complexes Consider left atrial enlargement RBBB and LPFB Repol abnrm suggests ischemia, diffuse leads Confirmed by Malvin Johns [phone]) on 10/17/2018 6:54:19 PM    ED ECG REPORT   Date: 10/17/2018  Rate: 93   Rhythm: [place]  QRS Axis: normal  Intervals: normal  ST/T Wave abnormalities: nonspecific T wave changes  Conduction Disutrbances:right bundle branch block  Narrative Interpretation:   Old EKG Reviewed: unchanged  I have personally reviewed the EKG tracing and agree with the computerized printout as noted.   Radiology Dg Chest 2 View  Result Date: 10/16/2018 CLINICAL DATA:  Valve replacement shortness of breath with exertion EXAM: CHEST - 2 VIEW COMPARISON:  07/23/2018 FINDINGS: Mild cardiomegaly with prominent central pulmonary vessels. Tiny pleural effusions. No focal consolidation. Aortic atherosclerosis. No pneumothorax. IMPRESSION: 1. Cardiomegaly with tiny pleural effusions. Electronically Signed   By: Donavan Foil M.D.   On: 10/16/2018 15:39    Procedures Procedures (including critical care time)  Medications Ordered in ED Medications  sodium chloride flush (NS) 0.9 % injection 3 mL (has no administration in time range)     Initial Impression / Assessment and Plan / ED Course  I have reviewed the triage vital signs and the nursing notes.  Pertinent labs & imaging results that were available during my care of the patient were reviewed by me and considered in my medical decision making (see chart for details).  Clinical Course as of Oct 17 1902  Fri Oct 17, 2018  1753 Spoke with Dr. [name] of nephrology who will monitor the patient's disposition status and arrange for inpatient dialysis if patient is admitted and outpatient dialysis if    [AM]  1759 Spoke with Dr. Johnsie Cancel cardiology who recommends medical admission.  He will consult on the patient and assess potentially moving up the TAVR.   [AM]  [phone] Spoke with  Dr. Posey Pronto who will admit pt. Appreciate his involvement.    [AM]  1900 Decreased.   Troponin I (High Sensitivity)(!): 77 [AM]  1900 Do not believe this pulse is accurate. Patient is having a lot of ectopy.   Pulse Rate(!): 30 [AM]    Clinical Course User Index [AM] Tamala Julian        This is a well-appearing 68 year old male with a past medical history of CAD, NSTEMI status post RCA stent in September 2019, ESRD on dialysis Monday Wednesday Friday, PAD, hypertension, critical aortic stenosis, amyloidosis presenting for shortness of breath and generalized malaise.    Differential diagnosis includes: Differential diagnosis includes HF exacerbation, reactive airway disease, COPD exacerbation, pneumonia, PE, ACS, cardiac tamponade, retropharyngeal abscess, metabolic acidosis.  Cardiac catheterization from 07/24/18:  1. Severe single vessel CAD with chronic total occlusion of the RCA, collateralized with L--->R collaterals 2. Mild calcific nonobstructive LCA disease,  anatomy unchanged from previous cath studies 3. At least moderate AS, possibly severe LFLG - will check echo  Patient is hemodynamically stable, however he has had some bradycardic readings.  I reviewed his EKG, it appears he is in bigeminy.  Suspect that cardiac monitoring is not picking up some of his beats, crating falsely bradycardic readings.  Initial troponin elevated at 85.  Going back in time, patient has had chronically elevated troponins, however not high-sensitivity troponins previously.  Will trend troponin.  Patient is supposed to be on ASA daily, did not take today.  Will give 324 ASA given shortness of breath and elevated troponin.  We will consult cardiology and nephrology.  Dr. Johnsie Cancel of cardiology recommends medical admission.  Dr. Posey Pronto of cardiology to admit. Appreciate the involvement of these teams.   Final Clinical Impressions(s) / ED Diagnoses   Final diagnoses:  Dyspnea on exertion    ED  Discharge Orders    None       Tamala Julian 10/17/18 1905    Malvin Johns, MD 10/17/18 [phone]

## 2018-10-17 NOTE — ED Triage Notes (Signed)
Pt states he has had shortness of breath since May that has gotten worse. He is having a valve replacement next week. Was told by RN to come to ED if SOB got any worse. Pt also a dialysis pt, last dialyzed Wednesday.

## 2018-10-18 DIAGNOSIS — R0600 Dyspnea, unspecified: Secondary | ICD-10-CM

## 2018-10-18 DIAGNOSIS — F419 Anxiety disorder, unspecified: Secondary | ICD-10-CM

## 2018-10-18 LAB — CBC
HCT: 28.7 % — ABNORMAL LOW (ref 39.0–52.0)
Hemoglobin: 9.3 g/dL — ABNORMAL LOW (ref 13.0–17.0)
MCH: 33.8 pg (ref 26.0–34.0)
MCHC: 32.4 g/dL (ref 30.0–36.0)
MCV: 104.4 fL — ABNORMAL HIGH (ref 80.0–100.0)
Platelets: 59 10*3/uL — ABNORMAL LOW (ref 150–400)
RBC: 2.75 MIL/uL — ABNORMAL LOW (ref 4.22–5.81)
RDW: 17.2 % — ABNORMAL HIGH (ref 11.5–15.5)
WBC: 3.9 10*3/uL — ABNORMAL LOW (ref 4.0–10.5)
nRBC: 0 % (ref 0.0–0.2)

## 2018-10-18 LAB — RENAL FUNCTION PANEL
Albumin: 3.4 g/dL — ABNORMAL LOW (ref 3.5–5.0)
Anion gap: 19 — ABNORMAL HIGH (ref 5–15)
BUN: 48 mg/dL — ABNORMAL HIGH (ref 8–23)
CO2: 26 mmol/L (ref 22–32)
Calcium: 8.7 mg/dL — ABNORMAL LOW (ref 8.9–10.3)
Chloride: 94 mmol/L — ABNORMAL LOW (ref 98–111)
Creatinine, Ser: 9.23 mg/dL — ABNORMAL HIGH (ref 0.61–1.24)
GFR calc Af Amer: 6 mL/min — ABNORMAL LOW (ref >60)
GFR calc non Af Amer: 5 mL/min — ABNORMAL LOW (ref >60)
Glucose, Bld: 87 mg/dL (ref 70–99)
Phosphorus: 7 mg/dL — ABNORMAL HIGH (ref 2.5–4.6)
Potassium: 3.8 mmol/L (ref 3.5–5.1)
Sodium: 139 mmol/L (ref 135–145)

## 2018-10-18 LAB — TSH: TSH: 1.662 u[IU]/mL (ref 0.350–4.500)

## 2018-10-18 LAB — BRAIN NATRIURETIC PEPTIDE: B Natriuretic Peptide: 1967.8 pg/mL — ABNORMAL HIGH (ref 0.0–100.0)

## 2018-10-18 MED ORDER — CINACALCET HCL 30 MG PO TABS
180.0000 mg | ORAL_TABLET | ORAL | Status: DC
  Administered 2018-10-20: 180 mg via ORAL
  Filled 2018-10-18: qty 6

## 2018-10-18 MED ORDER — OXYCODONE HCL 5 MG PO TABS: 5.0000 mg | ORAL_TABLET | ORAL | Status: DC | PRN

## 2018-10-18 MED ORDER — SUCROFERRIC OXYHYDROXIDE 500 MG PO CHEW
1500.0000 mg | CHEWABLE_TABLET | Freq: Three times a day (TID) | ORAL | Status: DC
  Administered 2018-10-18 – 2018-10-20 (×8): 1500 mg via ORAL
  Filled 2018-10-18 (×11): qty 3

## 2018-10-18 MED ORDER — LORAZEPAM 1 MG PO TABS: 1.0000 mg | ORAL_TABLET | Freq: Two times a day (BID) | ORAL | Status: DC | PRN

## 2018-10-18 MED ORDER — SUCROFERRIC OXYHYDROXIDE 500 MG PO CHEW
1000.0000 mg | CHEWABLE_TABLET | Freq: Three times a day (TID) | ORAL | Status: DC | PRN
  Filled 2018-10-18: qty 2

## 2018-10-18 MED ORDER — CALCITRIOL 0.25 MCG PO CAPS
1.5000 ug | ORAL_CAPSULE | ORAL | Status: DC
  Administered 2018-10-20: 1.5 ug via ORAL
  Filled 2018-10-18: qty 3

## 2018-10-18 MED ORDER — CHLORHEXIDINE GLUCONATE CLOTH 2 % EX PADS
6.0000 | MEDICATED_PAD | Freq: Every day | CUTANEOUS | Status: DC
  Administered 2018-10-18: 6 via TOPICAL

## 2018-10-18 MED ORDER — MIDODRINE HCL 5 MG PO TABS
10.0000 mg | ORAL_TABLET | ORAL | Status: DC
  Administered 2018-10-18 – 2018-10-20 (×3): 10 mg via ORAL
  Filled 2018-10-18 (×3): qty 2

## 2018-10-18 MED ORDER — HEPARIN SODIUM (PORCINE) 1000 UNIT/ML DIALYSIS
5000.0000 [IU] | Freq: Once | INTRAMUSCULAR | Status: AC
  Administered 2018-10-19: 5000 [IU] via INTRAVENOUS_CENTRAL
  Filled 2018-10-18: qty 5

## 2018-10-18 NOTE — Progress Notes (Signed)
PROGRESS NOTE  Jose Garrett IRW:[id number] DOB: 08/08/1950 DOA: 10/17/2018 PCP: [name], MD  Brief History   Jose Garrett is a 68 y.o. male with medical history significant for chronic combined systolic and diastolic CHF (EF 76-19%), ESRD on MWF HD, CAD, severe aortic stenosis, PAD status post left femoropopliteal bypass, hypertension, hyperlipidemia, thrombocytopenia, and anxiety who presents the ED for evaluation of dyspnea.  Patient states he has baseline shortness of breath however had sudden worsening of dyspnea morning of 10/17/2018.  He states he becomes very short of breath with minimal exertion and has had paroxysmal nocturnal dyspnea.  He has noticed becoming generally weak especially at the end of dialysis sessions and reports that his blood pressure becomes low at the end of the sessions as well.  He has had a cough productive of occasional clear sputum.  He reports generalized weakness and some lightheadedness with activity and states that his balance has been off.  He denies any falls or loss of consciousness.  He denies any chest pain, palpitations, subjective fevers, diaphoresis, chills, abdominal pain.  He no longer makes urine.  He states he is having occasional loose but not watery stools.  Initial vitals showed BP 125/52, pulse 92, RR 20, temp 97.7 Fahrenheit, SPO2 96% on room air.  Labs are notable for WBC 4.2, hemoglobin 10.5, platelets 69,000, sodium 140, potassium 3.6, bicarb 26, BUN 40, creatinine 8.37, high-sensitivity troponin 85 > 77.  SARS-CoV-2 test was negative.  Portable chest x-ray showed enlarged cardiac silhouette with pulmonary venous congestion.  Patient was given aspirin 324 mg in the ED.  Cardiology were consulted and recommended admission for further management prior to potential TAVR procedure.  Nephrology were consulted and will see tomorrow for planned HD.  The hospitalist service was consulted to admit.  Jose Garrett is a 68 y.o. male with  medical history significant for chronic combined systolic and diastolic CHF (EF 50-93%), ESRD on MWF HD, CAD, severe aortic stenosis, PAD status post left femoropopliteal bypass, hypertension, hyperlipidemia, thrombocytopenia, and anxiety who is admitted with progressive dyspnea.  Consultants  . Nephrology . Cardiology  Procedures  . Dialysis  Antibiotics   Anti-infectives (From admission, onward)   None    .  Subjective  The patient is resting comfortably. No new complaints.   Objective   Vitals:  Vitals:   10/18/18 0528 10/18/18 0843  BP: 94/76 93/65  Pulse: 82 88  Resp: 18 18  Temp: 97.8 F (36.6 C) (!) 97.4 F (36.3 C)  SpO2: 95% 97%    Exam:  Constitutional:  . The patient is awake, alert, and oriented x 3. No acute distress. Respiratory:  . No increased work of breathing. . No wheezes or rhonchi. . Mild rales present. . No tactile fremitus. Cardiovascular:  . Regular rate and rhythm. . No murmurs, ectopy, or gallups . No lateral PMI. No thrills. Abdomen:  . Abdomen is soft, non-tender, non-distended. . No hernias, masses, or organomegaly are appreciated. . Normoactive bowel sounds. Musculoskeletal:  . No cyanosis or clubbing . Positive for 2+ edema Skin:  . No rashes, lesions, ulcers . palpation of skin: no induration or nodules Neurologic:  . CN 2-12 intact . Sensation all 4 extremities intact Psychiatric:  . Mental status o Mood, affect appropriate o Orientation to person, place, time  . judgment and insight appear intact    I have personally reviewed the following:   Today's Data  . Vitals, BMP, CBC  Scheduled Meds: . aspirin EC  81 mg Oral QPM  . atorvastatin  80 mg Oral Q supper  . [START ON 10/20/2018] calcitRIOL  1.5 mcg Oral Q M,W,F-HD  . Chlorhexidine Gluconate Cloth  6 each Topical Q0600  . [START ON 10/20/2018] cinacalcet  180 mg Oral Q M,W,F-HD  . clopidogrel  75 mg Oral Q supper  . gabapentin  300 mg Oral Daily  .  loratadine  10 mg Oral Daily  . [START ON 10/20/2018] midodrine  10 mg Oral Q M,W,F-HD  . multivitamin  1 tablet Oral QHS  . pantoprazole  40 mg Oral QPM  . sodium chloride flush  3 mL Intravenous Q12H  . sucroferric oxyhydroxide  1,500 mg Oral TID WC  . vitamin B-12  1,000 mcg Oral QPM   Continuous Infusions:  Principal Problem:   Dyspnea Active Problems:   Essential hypertension, benign   ESRD (end stage renal disease) on dialysis (HCC)   Thrombocytopenia (HCC)   Chronic combined systolic and diastolic congestive heart failure (HCC)   Coronary artery disease involving native coronary artery of native heart without angina pectoris   Aortic stenosis   Anxiety   LOS: 1 day   A & P   Dyspnea: Multifactorial from symptomatic aortic stenosis, pulmonary edema, and progressive deconditioning from his chronic comorbidities.  Cardiology and nephrology following. Plan for volume control with HD today per nephrology  ESRD on MWF HD: Nephrology consulted by EDP, plan for HD today. The patient will be continued on midodrine with HD.  Chronic combined systolic and diastolic CHF: EF 60-63% by TTE 07/25/2018.  He does have rales and edema on both legs.  Volume is controlled by dialysis.  He is previously on Lopressor, however states he is not taking it.  Consider adding back if BP allows. TSH normal.  Severe aortic stenosis: Has plans for TAVR on 10/21/2018.  Cardiology has seen patient and will notify TAVR team will likely see patient on Monday.  CAD: Severe single-vessel CAD with chronic total occlusion of the RCA noted on left heart cath 07/24/2018.  Patient denies any chest pain.  High-sensitivity troponin minimally elevated and has trended down on repeat. The patient will be continued on aspirin, Plavix, atorvastatin as at home.  PAD status post left femoropopliteal bypass: Continue aspirin, Plavix, atorvastatin  Hypertension: Actually on midodrine for hypotension prior to HD. No longer  on antihypertensives. Monitor.  Thrombocytopenia: Chronic and stable compared to recent labs.  He denies any obvious bleeding.  Monitor closely while on aspirin and Plavix.  Hyperlipidemia: Continue atorvastatin.  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 PT/OT eval and treat.  I have seen and examined this patient myself. I have spent 32 minutes in his evaluation and care.  DVT prophylaxis: SCDs Code Status: Full code, confirmed with patient Family Communication: Discussed with patient, he has discussed with daughter Disposition Plan: Pending clinical progress   Cheria Sadiq, DO [facility] Direct contact: see [email or web address]  7PM-7AM contact night coverage as above 10/18/2018, 3:56 PM  LOS: 1 day

## 2018-10-18 NOTE — Progress Notes (Signed)
Subjective:   Less dyspnea   Objective:  Vitals:   10/17/18 1930 10/17/18 2000 10/17/18 2038 10/18/18 0528  BP: 102/80 104/72 (!) 97/52 94/76  Pulse: (!) 41  86 82  Resp: 19   18  Temp:   97.8 F (36.6 C) 97.8 F (36.6 C)  TempSrc:   Oral Oral  SpO2: 95%  97% 95%  Weight:   85.2 kg 84.8 kg  Height:   5\' 10"  (1.778 m)     Intake/Output from previous day:  Intake/Output Summary (Last 24 hours) at 10/18/2018 7169 Last data filed at 10/18/2018 0600 Gross per 24 hour  Intake 240 ml  Output -  Net 240 ml    Physical Exam:  Affect appropriate Chronically ill male  HEENT: normal Neck supple with no adenopathy JVP normal no bruits no thyromegaly Lungs basilar rales  no wheezing and good diaphragmatic motion Heart:  S1/S2 AS murmur, no rub, gallop or click PMI normal Abdomen: benighn, BS positve, no tenderness, no AAA no bruit.  No HSM or HJR Distal pulses intact with no bruits Plus 2 LE edema with left fem pop  Neuro non-focal Skin warm and dry No muscular weakness Fistula RUE   Lab Results: Basic Metabolic Panel: Recent Labs    10/17/18 1318 10/18/18 0334  NA 140 139  K 3.6 3.8  CL 95* 94*  CO2 26 26  GLUCOSE 112* 87  BUN 40* 48*  CREATININE 8.37* 9.23*  CALCIUM 9.3 8.7*  PHOS  --  7.0*   Liver Function Tests: Recent Labs    10/16/18 1045 10/18/18 0334  AST 17  --   ALT 13  --   ALKPHOS 58  --   BILITOT 1.1  --   PROT 7.0  --   ALBUMIN 3.9 3.4*   No results for input(s): LIPASE, AMYLASE in the last 72 hours. CBC: Recent Labs    10/17/18 1318 10/18/18 0334  WBC 4.2 3.9*  HGB 10.5* 9.3*  HCT 33.4* 28.7*  MCV 108.1* 104.4*  PLT 69* 59*   Cardiac Enzymes: No results for input(s): CKTOTAL, CKMB, CKMBINDEX, TROPONINI in the last 72 hours. BNP: Invalid input(s): POCBNP D-Dimer: No results for input(s): DDIMER in the last 72 hours. Hemoglobin A1C: Recent Labs    10/16/18 1045  HGBA1C 5.0   Fasting Lipid Panel: No results for  input(s): CHOL, HDL, LDLCALC, TRIG, CHOLHDL, LDLDIRECT in the last 72 hours. Thyroid Function Tests: Recent Labs    10/18/18 0334  TSH 1.662   Anemia Panel: No results for input(s): VITAMINB12, FOLATE, FERRITIN, TIBC, IRON, RETICCTPCT in the last 72 hours.  Imaging: Dg Chest 2 View  Result Date: 10/16/2018 CLINICAL DATA:  Valve replacement shortness of breath with exertion EXAM: CHEST - 2 VIEW COMPARISON:  07/23/2018 FINDINGS: Mild cardiomegaly with prominent central pulmonary vessels. Tiny pleural effusions. No focal consolidation. Aortic atherosclerosis. No pneumothorax. IMPRESSION: 1. Cardiomegaly with tiny pleural effusions. Electronically Signed   By: Donavan Foil M.D.   On: 10/16/2018 15:39   Dg Chest Portable 1 View  Result Date: 10/17/2018 CLINICAL DATA:  Intermittent chest pain with exertion. EXAM: PORTABLE CHEST 1 VIEW COMPARISON:  October 16, 2018 FINDINGS: No pneumothorax. Stable cardiomegaly. The hila and mediastinum are unremarkable. No pulmonary nodules or masses. No focal infiltrates. No overt edema. Mild pulmonary venous congestion not excluded. IMPRESSION: Cardiomegaly. Suspected mild pulmonary venous congestion. No other abnormalities. Electronically Signed   By: Dorise Bullion III M.D   On: 10/17/2018 16:52  Cardiac Studies:  ECG:  SR RBBB ectopy    Telemetry:  SR PAC;s PVCls   Echo: EF 35-40% low gradient severe AS mean gradient 30 peak 54 mmHg  Medications:   . aspirin EC  81 mg Oral QPM  . atorvastatin  80 mg Oral Q supper  . clopidogrel  75 mg Oral Q supper  . gabapentin  300 mg Oral Daily  . gabapentin  600 mg Oral QHS  . loratadine  10 mg Oral Daily  . [START ON 10/20/2018] midodrine  10 mg Oral Q M,W,F-HD  . multivitamin  1 tablet Oral QHS  . pantoprazole  40 mg Oral QPM  . sodium chloride flush  3 mL Intravenous Once  . sodium chloride flush  3 mL Intravenous Q12H  . sucroferric oxyhydroxide  1,000 mg Oral TID WC  . vitamin B-12  1,000 mcg Oral QPM       Assessment/Plan:   AS:  Severe for TAVR Tuesday came in with mild CHF and failure to thrive ? Cardiac cachexia Missed dialysis yesterday should have today to lower volume  Cath 07/24/18 with known occluded and collateralized RCA stable  Will have TAVR team see on Monday    Jenkins Rouge 10/18/2018, 8:38 AM

## 2018-10-18 NOTE — Evaluation (Signed)
Physical Therapy Evaluation Patient Details Name: Jose Garrett MRN: [id number] DOB: 06-27-1950 Today's Date: 10/18/2018   History of Present Illness  68 yo admitted with dyspnea awaiting TAVR. PMHx: aortic stenosis, HTN, HLD, GERD, CHF, CAD, PAD, anxiety, ESRD on HD  Clinical Impression  Pt very pleasant and talkative. Pt has been living with daughter for a few months since decline in health and desires to return to independence for return to his house. Pt able to walk short hall distance with use of cane and standing rest. Pt aware of energy conservation measures and at times needs cues to perform with drop in saturation. Pt with decreased activity tolerance and fatigue limiting his function and independence who will benefit from acute therapy to maximize mobility for return home.   HR 90-98, SpO2 86-94% on RA with activity    Follow Up Recommendations No PT follow up    Equipment Recommendations  None recommended by PT    Recommendations for Other Services       Precautions / Restrictions Precautions Precautions: Fall Precaution Comments: watch sats      Mobility  Bed Mobility               General bed mobility comments: in bathroom on arrival  Transfers Overall transfer level: Modified independent                  Ambulation/Gait Ambulation/Gait assistance: Supervision Gait Distance (Feet): 200 Feet Assistive device: Straight cane Gait Pattern/deviations: Step-through pattern;Decreased stride length   Gait velocity interpretation: 1.31 - 2.62 ft/sec, indicative of limited community ambulator General Gait Details: cues for energy conservation and breathing technique as well as to monitor cardiopulmonary function. Pt with HR 90-98 during gait. SpO2 dropped to 86% with 1st 100' requiring standing rest and maintained 90% on RA return 100'  Stairs            Wheelchair Mobility    Modified Rankin (Stroke Patients Only)       Balance Overall  balance assessment: Mild deficits observed, not formally tested                                           Pertinent Vitals/Pain Pain Assessment: No/denies pain    Home Living Family/patient expects to be discharged to:: Private residence Living Arrangements: Children Available Help at Discharge: Family;Available PRN/intermittently Type of Home: House Home Access: Stairs to enter   Entrance Stairs-Number of Steps: 1 Home Layout: One level Home Equipment: Cane - single point      Prior Function Level of Independence: Independent with assistive device(s)         Comments: Uses a cane to walk outside of the house, utilizes energy conservation, limited to grossly 300' gait due to fatigue     Hand Dominance        Extremity/Trunk Assessment   Upper Extremity Assessment Upper Extremity Assessment: Overall WFL for tasks assessed    Lower Extremity Assessment Lower Extremity Assessment: Overall WFL for tasks assessed    Cervical / Trunk Assessment Cervical / Trunk Assessment: Normal  Communication   Communication: No difficulties  Cognition Arousal/Alertness: Awake/alert Behavior During Therapy: WFL for tasks assessed/performed Overall Cognitive Status: Within Functional Limits for tasks assessed  General Comments      Exercises     Assessment/Plan    PT Assessment Patient needs continued PT services  PT Problem List Decreased mobility;Decreased activity tolerance;Cardiopulmonary status limiting activity       PT Treatment Interventions Therapeutic activities;Therapeutic exercise;Patient/family education;Gait training;Functional mobility training    PT Goals (Current goals can be found in the Care Plan section)  Acute Rehab PT Goals Patient Stated Goal: return to my house after surgery PT Goal Formulation: With patient Time For Goal Achievement: 11/01/18 Potential to Achieve Goals:  Fair    Frequency Min 2X/week   Barriers to discharge        Co-evaluation               AM-PAC PT "6 Clicks" Mobility  Outcome Measure Help needed turning from your back to your side while in a flat bed without using bedrails?: None Help needed moving from lying on your back to sitting on the side of a flat bed without using bedrails?: None Help needed moving to and from a bed to a chair (including a wheelchair)?: None Help needed standing up from a chair using your arms (e.g., wheelchair or bedside chair)?: None Help needed to walk in hospital room?: A Little Help needed climbing 3-5 steps with a railing? : A Little 6 Click Score: 22    End of Session Equipment Utilized During Treatment: Gait belt Activity Tolerance: Patient tolerated treatment well Patient left: in chair;with call bell/phone within reach Nurse Communication: Mobility status PT Visit Diagnosis: Other abnormalities of gait and mobility (R26.89)    Time: 8144-8185 PT Time Calculation (min) (ACUTE ONLY): 15 min   Charges:   PT Evaluation $PT Eval Moderate Complexity: [place], PT Acute Rehabilitation Services Pager: [phone] Office: Homer B Videl Nobrega 10/18/2018, 1:11 PM

## 2018-10-18 NOTE — Consult Note (Addendum)
[facility] Renal Consultation Note    Indication for Consultation:  Management of ESRD/hemodialysis, anemia, hypertension/volume, and secondary hyperparathyroidism. PCP:  HPI: Jose Garrett is a 68 y.o. male with a history of ESRD, CHF (EF35-40%), CAD, aortic stenosis, PAD, and HTN who presented to the ED yesterday with dyspnea. Patient reports he felt significant dyspnea on exertion throughout the day yesterday and felt generally unwell. He states he felt similar when he had an MI in the past, so he decided to go to the ED for evaluation. On presentation, BP 125/52, RR 20, T 97.7, SPO2 96% on room air. Labs notable for Hgb 10.5, Plt 69K, K+ 3.6, BUN 40, Cr 8.37. CXR related enlarged cardiac silhouette with pulmonary venous congestion. Patient was scheduled for TAVR procedure on 10/21/2018. Cardiology is following.   His last HD was Wednesday,  10/15/2018. Patient does have a history of hypotension and takes midodrine before dialysis. He reports he can tell when his BP is low, and starts to feel nauseous. This sometimes limits fluid removal and he left 1.5kg above his EDW of 82.5kg. He reports dyspnea on exertion and PND. Reports he has peripheral edema at baseline, and has not noticed this worsening lately. He denies CP, palpitations, dizziness, abdominal pain, nausea and vomiting. Has had some loose stools lately but no bleeding reported.   Past Medical History:  Diagnosis Date  . Acute myocardial infarction involving right coronary artery ([place])   . Amyloidosis ([place])   . Anemia   . Anxiety   . Aortic stenosis    severe  . CAD (coronary artery disease)    a. cardiac cath on 11/25/17 with occlusion of the RCA which could have been his event but the RCA could not be engaged. The RCA filled distally from left to right collaterals. There were no severe stenoses in the left coronary system. Medical therapy recommended, started on plavix.  . Chronic combined systolic and diastolic CHF  (congestive heart failure) ([place])    a. EF dropped to 35-40% with grade 2 DD by echo 11/2017.  [name] DJD (degenerative joint disease), lumbar   . Dyspnea   . ESRD (end stage renal disease) ([place])    ESRD- [facility] MWF- [facility]  . GERD (gastroesophageal reflux disease)   . H/O seasonal allergies   . Hyperlipidemia   . Hypertension   . Muscle spasms of lower extremity     RLE   " it is because of my back"  . Neuropathy   . Peripheral vascular disease ([place])   . Pneumonia   . PONV (postoperative nausea and vomiting)   . Restless legs   . Wears glasses    Past Surgical History:  Procedure Laterality Date  . ABDOMINAL AORTOGRAM W/LOWER EXTREMITY Bilateral 07/16/2018   Procedure: ABDOMINAL AORTOGRAM W/LOWER EXTREMITY;  Surgeon: [place] Hampshire, MD;  Location: [place] [facility];  Service: Cardiovascular;  Laterality: Bilateral;  . AMPUTATION Left 08/19/2018   Procedure: AMPUTATION LEFT TOES SECOND AND THIRD;  Surgeon: Waynetta Sandy, MD;  Location: [place];  Service: Vascular;  Laterality: Left;  . AV FISTULA PLACEMENT Right 12/27/2015   Procedure: Right Arm ARTERIOVENOUS (AV) FISTULA CREATION;  Surgeon: Angelia Mould, MD;  Location: [place];  Service: Vascular;  Laterality: Right;  . CATARACT EXTRACTION W/ INTRAOCULAR LENS  IMPLANT, BILATERAL    . COLONOSCOPY    . ERCP W/ SPHICTEROTOMY    . FEMORAL-POPLITEAL BYPASS GRAFT Left 07/22/2018   Procedure: left FEMORAL TO BELOW KNEE  POPLITEAL ARTERY BYPASS GREAFT using 24mm removable ring propaten gore graft;  Surgeon: Waynetta Sandy, MD;  Location: [facility];  Service: Vascular;  Laterality: Left;  . IR GENERIC HISTORICAL  01/27/2016   IR FLUORO GUIDE CV LINE RIGHT 01/27/2016 Arne Cleveland, MD [facility]  . IR GENERIC HISTORICAL  01/27/2016   IR US GUIDE VASC ACCESS RIGHT 01/27/2016 Arne Cleveland, MD [facility]  . LAPAROSCOPIC CHOLECYSTECTOMY  03/2011  . LAPAROSCOPIC CHOLECYSTECTOMY    . LEFT HEART CATH AND CORONARY  ANGIOGRAPHY N/A 11/25/2017   Procedure: LEFT HEART CATH AND CORONARY ANGIOGRAPHY;  Surgeon: Martinique, Peter M, MD;  Location: [place] [facility];  Service: Cardiovascular;  Laterality: N/A;  . LEFT HEART CATH AND CORONARY ANGIOGRAPHY N/A 07/24/2018   Procedure: LEFT HEART CATH AND CORONARY ANGIOGRAPHY;  Surgeon: Sherren Mocha, MD;  Location: [place] [facility];  Service: Cardiovascular;  Laterality: N/A;  . RENAL BIOPSY, PERCUTANEOUS Right 09/08/2014  . RIGHT/LEFT HEART CATH AND CORONARY ANGIOGRAPHY N/A 03/25/2018   Procedure: RIGHT/LEFT HEART CATH AND CORONARY ANGIOGRAPHY;  Surgeon: Martinique, Peter M, MD;  Location: [place] [facility];  Service: Cardiovascular;  Laterality: N/A;  . TOE AMPUTATION Left 08/19/2018   2nd & 3rd toes  . TONSILLECTOMY  ~ 1960  . ULTRASOUND GUIDANCE FOR VASCULAR ACCESS  03/25/2018   Procedure: Ultrasound Guidance For Vascular Access;  Surgeon: Martinique, Peter M, MD;  Location: [place] [facility];  Service: Cardiovascular;;   Family History  Problem Relation Age of Onset  . Hypertension Mother   . Diabetes Mother   . Heart disease Mother   . Skin cancer Mother   . Hypertension Father   . Diabetes Father   . Diabetes Sister    Social History:  reports that he quit smoking about 7 years ago. His smoking use included cigarettes. He has a 80.00 pack-year smoking history. He has never used smokeless tobacco. He reports that he does not drink alcohol or use drugs.  ROS: As per HPI otherwise negative.   Physical Exam: Vitals:   10/17/18 1930 10/17/18 2000 10/17/18 2038 10/18/18 0528  BP: 102/80 104/72 (!) 97/52 94/76  Pulse: (!) 41  86 82  Resp: 19   18  Temp:   97.8 F (36.6 C) 97.8 F (36.6 C)  TempSrc:   Oral Oral  SpO2: 95%  97% 95%  Weight:   85.2 kg 84.8 kg  Height:   5\' 10"  (1.778 m)      General: Well developed, well nourished, in no acute distress. Eating breakfast Head: Normocephalic, atraumatic, sclera non-icteric, mucus membranes are moist. Neck:   JVD not elevated. Lungs: Decreased lung sounds at bases. No wheezing, rhonchi or rales auscultated Heart: RRR with normal S1, S2. 3/6 systolic murmur Abdomen: Soft, non-tender, non-distended with normoactive bowel sounds. No rebound/guarding. No obvious abdominal masses. Musculoskeletal:  Strength and tone appear normal for age. Lower extremities: 1+ edema bilateral lower extremities Neuro: Alert and oriented X 3. Moves all extremities spontaneously. Psych:  Responds to questions appropriately with a normal affect. Dialysis Access: RUE AVF  No Known Allergies Prior to Admission medications   Medication Sig Start Date End Date Taking? Authorizing Provider  acetaminophen (TYLENOL) 500 MG tablet Take 500-1,000 mg by mouth daily as needed for moderate pain.    Yes [provider]  aspirin EC 81 MG tablet Take 1 tablet (81 mg total) by mouth daily. Patient taking differently: Take 81 mg by mouth every evening.  11/30/17  Yes Mancel Bale,  Rosalene Billings, NP  atorvastatin (LIPITOR) 80 MG tablet Take 1 tablet (80 mg total) by mouth daily with supper. 08/12/18  Yes Angiulli, Lavon Paganini, PA-C  cetirizine (ZYRTEC) 10 MG tablet Take 10 mg by mouth daily.   Yes [provider]  clopidogrel (PLAVIX) 75 MG tablet Take 1 tablet (75 mg total) by mouth daily with supper. 08/12/18  Yes Angiulli, Lavon Paganini, PA-C  gabapentin (NEURONTIN) 300 MG capsule Take 1 capsule (300 mg total) by mouth every morning. Patient taking differently: Take 300-600 mg by mouth See admin instructions. Take one capsule (300 mg) by mouth every morning and two capsules (600 mg) at night, may also take two capsules (600 mg) midday as needed for pain 08/13/18  Yes Angiulli, Lavon Paganini, PA-C  LORazepam (ATIVAN) 1 MG tablet Take 1 mg by mouth 2 (two) times daily as needed for anxiety.   Yes [provider]  midodrine (PROAMATINE) 10 MG tablet Take 1 tablet (10 mg total) by mouth every Monday, Wednesday, and Friday with  hemodialysis. Patient taking differently: Take 10 mg by mouth See admin instructions. Take one tablet (10 mg) by mouth every Monday, Wednesday, Friday before dialysis 08/13/18  Yes Angiulli, Lavon Paganini, PA-C  multivitamin (RENA-VIT) TABS tablet Take 1 tablet by mouth at bedtime. 08/12/18  Yes Angiulli, Lavon Paganini, PA-C  mupirocin ointment (BACTROBAN) 2 % Apply 1 application topically daily as needed (irritation).    Yes [provider]  oxyCODONE (OXY IR/ROXICODONE) 5 MG immediate release tablet Take 1-2 tablets (5-10 mg total) by mouth every 4 (four) hours as needed for moderate pain. 08/20/18  Yes Laurence Slate M, PA-C  pantoprazole (PROTONIX) 40 MG tablet Take 1 tablet (40 mg total) by mouth daily. Patient taking differently: Take 40 mg by mouth every evening.  10/03/18  Yes Fulp, Cammie, MD  sucroferric oxyhydroxide (VELPHORO) 500 MG chewable tablet Chew 2 tablets (1,000 mg total) by mouth 3 (three) times daily with meals. Patient taking differently: Chew 500-1,000 mg by mouth See admin instructions. Take 2 tablets (1000 mg) by mouth up to three times daily with meals, take 1 tablet (500 mg) with snacks 08/12/18  Yes Angiulli, Lavon Paganini, PA-C  vitamin B-12 (CYANOCOBALAMIN) 1000 MCG tablet Take 1,000 mcg by mouth every evening.    Yes [provider]  lidocaine (LIDODERM) 5 % Place 1 patch onto the skin daily. Remove & Discard patch within 12 hours or as directed by MD 10/09/18   Jamse Arn, MD  metoprolol tartrate (LOPRESSOR) 50 MG tablet Take 1 tablet (50 mg total) by mouth as directed. Patient not taking: Reported on 10/17/2018 09/22/18 09/22/19  Eileen Stanford, PA-C   Current Facility-Administered Medications  Medication Dose Route Frequency Provider Last Rate Last Dose  . acetaminophen (TYLENOL) tablet 650 mg  650 mg Oral Q6H PRN Lenore Cordia, MD       Or  . acetaminophen (TYLENOL) suppository 650 mg  650 mg Rectal Q6H PRN Lenore Cordia, MD      . aspirin EC tablet 81 mg   81 mg Oral QPM Patel, Vishal R, MD      . atorvastatin (LIPITOR) tablet 80 mg  80 mg Oral Q supper Lenore Cordia, MD   80 mg at 10/17/18 2158  . clopidogrel (PLAVIX) tablet 75 mg  75 mg Oral Q supper Lenore Cordia, MD   75 mg at 10/17/18 2158  . gabapentin (NEURONTIN) capsule 300 mg  300 mg Oral Daily Posey Pronto, Roxanne Mins  R, MD      . gabapentin (NEURONTIN) tablet 600 mg  600 mg Oral QHS Zada Finders R, MD   600 mg at 10/17/18 2158  . gabapentin (NEURONTIN) tablet 600 mg  600 mg Oral Daily PRN Zada Finders R, MD      . loratadine (CLARITIN) tablet 10 mg  10 mg Oral Daily Lenore Cordia, MD      . Derrill Memo ON 10/20/2018] midodrine (PROAMATINE) tablet 10 mg  10 mg Oral Q M,W,F-HD Zada Finders R, MD      . multivitamin (RENA-VIT) tablet 1 tablet  1 tablet Oral QHS Lenore Cordia, MD   1 tablet at 10/17/18 2158  . pantoprazole (PROTONIX) EC tablet 40 mg  40 mg Oral QPM Lenore Cordia, MD   40 mg at 10/17/18 2158  . sodium chloride flush (NS) 0.9 % injection 3 mL  3 mL Intravenous Once Belfi, Melanie, MD      . sodium chloride flush (NS) 0.9 % injection 3 mL  3 mL Intravenous Q12H Posey Pronto, Vishal R, MD      . sucroferric oxyhydroxide (VELPHORO) chewable tablet 1,000 mg  1,000 mg Oral TID WC Lenore Cordia, MD      . sucroferric oxyhydroxide (VELPHORO) chewable tablet 500 mg  500 mg Oral TID PRN Lenore Cordia, MD      . vitamin B-12 (CYANOCOBALAMIN) tablet 1,000 mcg  1,000 mcg Oral QPM Lenore Cordia, MD   1,000 mcg at 10/17/18 2158   Labs: Basic Metabolic Panel: Recent Labs  Lab 10/16/18 1045 10/17/18 1318 10/18/18 0334  NA 138 140 139  K 3.7 3.6 3.8  CL 96* 95* 94*  CO2 26 26 26   GLUCOSE 98 112* 87  BUN 26* 40* 48*  CREATININE 6.35* 8.37* 9.23*  CALCIUM 9.6 9.3 8.7*  PHOS  --   --  7.0*   Liver Function Tests: Recent Labs  Lab 10/16/18 1045 10/18/18 0334  AST 17  --   ALT 13  --   ALKPHOS 58  --   BILITOT 1.1  --   PROT 7.0  --   ALBUMIN 3.9 3.4*   No results for input(s):  LIPASE, AMYLASE in the last 168 hours. No results for input(s): AMMONIA in the last 168 hours. CBC: Recent Labs  Lab 10/16/18 1045 10/17/18 1318 10/18/18 0334  WBC 4.6 4.2 3.9*  HGB 9.9* 10.5* 9.3*  HCT 31.1* 33.4* 28.7*  MCV 106.5* 108.1* 104.4*  PLT 69* 69* 59*   Studies/Results: Dg Chest 2 View  Result Date: 10/16/2018 CLINICAL DATA:  Valve replacement shortness of breath with exertion EXAM: CHEST - 2 VIEW COMPARISON:  07/23/2018 FINDINGS: Mild cardiomegaly with prominent central pulmonary vessels. Tiny pleural effusions. No focal consolidation. Aortic atherosclerosis. No pneumothorax. IMPRESSION: 1. Cardiomegaly with tiny pleural effusions. Electronically Signed   By: Donavan Foil M.D.   On: 10/16/2018 15:39   Dg Chest Portable 1 View  Result Date: 10/17/2018 CLINICAL DATA:  Intermittent chest pain with exertion. EXAM: PORTABLE CHEST 1 VIEW COMPARISON:  October 16, 2018 FINDINGS: No pneumothorax. Stable cardiomegaly. The hila and mediastinum are unremarkable. No pulmonary nodules or masses. No focal infiltrates. No overt edema. Mild pulmonary venous congestion not excluded. IMPRESSION: Cardiomegaly. Suspected mild pulmonary venous congestion. No other abnormalities. Electronically Signed   By: Dorise Bullion III M.D   On: 10/17/2018 16:52    Dialysis:  [facility] MWF  4h  400/800  82.5kg  2/2 bath  Hep 8000  RUE AVF Sensipar 180mg  PO TIW with dialysis Mircera 186mcg IV q2weeks (last given 10/15/2018) Calcitriol 1.5 mcg PO TIW velphoro 3 tabs PO TID AC and 2/snack  Recent labs:  Hgb 10.4, Tsat 29; Ferritin 1303; Plt 62K; K+ 4.6; Albumin 4.1; calcium 9.4; {hps 5.8; PTH 615   Assessment/Plan: 1.  Dyspnea: Volume overloaded on exam, also with significant aortic stenosis planned for TAVR on 10/21/2018. Cardiology following.  Will plan for dialysis today for fluid removal as tolerated. Per outpatient notes, patient has had edema lately and plan was to slowly lower EDW.  2.  ESRD:  MWF, missed  dialysis yesterday. HD today with fluid removal as BP tolerates, then can likely resume MWF schedule. K+ 3.8, will use added K+ bath today.  3.  Hypertension/volume: BP soft. + edema and fluid on chest x-ray. Continue midodrine before dialysis. UFG today 2.5L as tolerated, will attempt to keep systolic BP >11. 4.  Anemia: Hgb 9.3. Not due for ESA yet.   5.  Metabolic bone disease: Calcium 8.7. Recent PTH elevated. Will continue sensipar and calcitriol. Phos 7.0- continue home binder.  6.  Nutrition:  Renal diet with fluid restrictions 7. Aortic stenosis: Plan for TAVR 10/21/2018. Per cardiology 8. Thrombocytopenia: BP stable, is on aspirin and plavix. Has tolerated heparin with dialysis at outpatient unit.   Anice Paganini, PA-C 10/18/2018, 8:17 AM  [facility] Pager: [phone]  Pt seen, examined and agree w A/P as above.  [name]  MD 10/18/2018, 1:55 PM

## 2018-10-18 NOTE — Progress Notes (Signed)
Report given to dialysis RN, patient is notified.

## 2018-10-18 NOTE — Progress Notes (Signed)
   Vital Signs MEWS/VS Documentation      10/17/2018 2038 10/18/2018 0528 10/18/2018 0700 10/18/2018 0843   MEWS Score:  1  1  1  1    MEWS Score Color:  Jose Garrett  Green  Green   Resp:  -  18  -  18   Pulse:  86  82  -  88   BP:  (!) 97/52  94/76  -  93/65   Temp:  97.8 F (36.6 C)  97.8 F (36.6 C)  -  (!) 97.4 F (36.3 C)   O2 Device:  Room [employer]  -  Room Air           Jose Garrett 10/18/2018,2:17 PM

## 2018-10-19 LAB — BASIC METABOLIC PANEL
Anion gap: 20 — ABNORMAL HIGH (ref 5–15)
BUN: 64 mg/dL — ABNORMAL HIGH (ref 8–23)
CO2: 25 mmol/L (ref 22–32)
Calcium: 9.1 mg/dL (ref 8.9–10.3)
Chloride: 94 mmol/L — ABNORMAL LOW (ref 98–111)
Creatinine, Ser: 10.7 mg/dL — ABNORMAL HIGH (ref 0.61–1.24)
GFR calc Af Amer: 5 mL/min — ABNORMAL LOW (ref >60)
GFR calc non Af Amer: 4 mL/min — ABNORMAL LOW (ref >60)
Glucose, Bld: 100 mg/dL — ABNORMAL HIGH (ref 70–99)
Potassium: 4.2 mmol/L (ref 3.5–5.1)
Sodium: 139 mmol/L (ref 135–145)

## 2018-10-19 LAB — RENAL FUNCTION PANEL
Albumin: 3.6 g/dL (ref 3.5–5.0)
Anion gap: 20 — ABNORMAL HIGH (ref 5–15)
BUN: 65 mg/dL — ABNORMAL HIGH (ref 8–23)
CO2: 22 mmol/L (ref 22–32)
Calcium: 9.2 mg/dL (ref 8.9–10.3)
Chloride: 97 mmol/L — ABNORMAL LOW (ref 98–111)
Creatinine, Ser: 10.77 mg/dL — ABNORMAL HIGH (ref 0.61–1.24)
GFR calc Af Amer: 5 mL/min — ABNORMAL LOW (ref >60)
GFR calc non Af Amer: 4 mL/min — ABNORMAL LOW (ref >60)
Glucose, Bld: 121 mg/dL — ABNORMAL HIGH (ref 70–99)
Phosphorus: 6.9 mg/dL — ABNORMAL HIGH (ref 2.5–4.6)
Potassium: 4 mmol/L (ref 3.5–5.1)
Sodium: 139 mmol/L (ref 135–145)

## 2018-10-19 LAB — CBC
HCT: 30.6 % — ABNORMAL LOW (ref 39.0–52.0)
Hemoglobin: 10 g/dL — ABNORMAL LOW (ref 13.0–17.0)
MCH: 34.1 pg — ABNORMAL HIGH (ref 26.0–34.0)
MCHC: 32.7 g/dL (ref 30.0–36.0)
MCV: 104.4 fL — ABNORMAL HIGH (ref 80.0–100.0)
Platelets: 74 10*3/uL — ABNORMAL LOW (ref 150–400)
RBC: 2.93 MIL/uL — ABNORMAL LOW (ref 4.22–5.81)
RDW: 17.1 % — ABNORMAL HIGH (ref 11.5–15.5)
WBC: 4.7 10*3/uL (ref 4.0–10.5)
nRBC: 0 % (ref 0.0–0.2)

## 2018-10-19 MED ORDER — HEPARIN SODIUM (PORCINE) 1000 UNIT/ML IJ SOLN
INTRAMUSCULAR | Status: AC
  Filled 2018-10-19: qty 5

## 2018-10-19 NOTE — Progress Notes (Addendum)
Pt stated that he feels upset because he's been waiting to have dialysis since 3pm. This Nurse called [facility] to follow up,  they said that they have critical pt, pt is on the list and they will call this nurse back if pt is next.  Patient was informed.  10/19/18 0315 Pt already asleep. Still awaiting dialysis to call back.  10/19/2018 0628 No call back from dialysis, Charge nurse is aware. Will pass on day shift.  Pt was aware.

## 2018-10-19 NOTE — Progress Notes (Signed)
[facility] Progress Note  Subjective: doing well, no c/o today  Vitals:   10/19/18 0952 10/19/18 1020 10/19/18 1035 10/19/18 1037  BP: 113/71 (!) 111/50 103/60 (!) 111/58  Pulse:  (!) 50 68 70  Resp:  20    Temp:  (!) 97.4 F (36.3 C)    TempSrc:  Oral    SpO2:  98%    Weight:  85.6 kg    Height:        Inpatient medications: . heparin      . aspirin EC  81 mg Oral QPM  . atorvastatin  80 mg Oral Q supper  . [START ON 10/20/2018] calcitRIOL  1.5 mcg Oral Q M,W,F-HD  . Chlorhexidine Gluconate Cloth  6 each Topical Q0600  . [START ON 10/20/2018] cinacalcet  180 mg Oral Q M,W,F-HD  . clopidogrel  75 mg Oral Q supper  . gabapentin  300 mg Oral Daily  . loratadine  10 mg Oral Daily  . midodrine  10 mg Oral Q M,W,F-HD  . multivitamin  1 tablet Oral QHS  . pantoprazole  40 mg Oral QPM  . sodium chloride flush  3 mL Intravenous Q12H  . sucroferric oxyhydroxide  1,500 mg Oral TID WC  . vitamin B-12  1,000 mcg Oral QPM    acetaminophen **OR** acetaminophen, LORazepam, oxyCODONE    Exam: General: Well developed, well nourished, in no acute distress Head: Normocephalic, atraumatic, sclera non-icteric Neck:  JVD not elevated. Lungs: Decreased lung sounds at bases. No wheezing, rhonchi or rales Heart: RRR with normal S1, S2. 3/6 systolic murmur Abdomen: Soft, non-tender, non-distended with normoactive bowel sounds Musculoskeletal:  Strength and tone appear normal for age. Lower extremities: 1+ edema bilateral lower extremities Neuro: Alert and oriented X 3. Moves all extremities spontaneously. Dialysis Access: RUE AVF   Dialysis:  [facility] MWF  4h  400/800  82.5kg  2/2 bath  Hep 8000  RUE AVF Sensipar 180mg  PO TIW with dialysis Mircera 17mcg IV q2weeks (last given 10/15/2018) Calcitriol 1.5 mcg PO TIW velphoro 3 tabs PO TID AC and 2/snack  Recent labs:  Hgb 10.4, Tsat 29; Ferritin 1303; Plt 62K; K+ 4.6; Albumin 4.1; calcium 9.4; {hps 5.8; PTH  615   Assessment/Plan: 1.  Dyspnea: vasc congestion on xray, not in distress. 3kg up, plan UF w/ HD today and tomorrow (did not get yest due to schedule).  2.  Severe AS: for TAVR procedure this week, per Cardiology 3.  ESRD:  MWF, missed dialysis Friday. See above.  4.  Hypertension/volume: BP soft. + edema and fluid on chest x-ray. Not real symptomatic today. Continue midodrine before dialysis. 5.  Anemia: Hgb 9.3. Not due for ESA yet.   6.  Metabolic bone disease: Calcium 8.7. Recent PTH elevated. Will continue sensipar and calcitriol. Phos 7.0- continue home binder.  7.  Nutrition:  Renal diet with fluid restrictions 8. Aortic stenosis: Plan for TAVR 10/21/2018. Per cardiology 9. Thrombocytopenia: BP stable, is on aspirin and plavix. Has tolerated heparin with dialysis at outpatient unit.     [facility] 10/19/2018, 12:42 PM  Iron/TIBC/Ferritin/ %Sat    Component Value Date/Time   IRON 44 (L) 08/06/2018 1410   IRON 34 (L) 09/24/2014 1224   TIBC 190 (L) 08/06/2018 1410   TIBC 284 09/24/2014 1224   FERRITIN 974 (H) 08/06/2018 1410   FERRITIN 335 (H) 12/20/2015 1009   IRONPCTSAT 23 08/06/2018 1410   IRONPCTSAT 12 (L) 09/24/2014 1224   IRONPCTSAT 36  03/02/2014 1227   Recent Labs  Lab 10/16/18 1045  10/19/18 1044  NA 138   < > 139  K 3.7   < > 4.0  CL 96*   < > 97*  CO2 26   < > 22  GLUCOSE 98   < > 121*  BUN 26*   < > 65*  CREATININE 6.35*   < > 10.77*  CALCIUM 9.6   < > 9.2  PHOS  --    < > 6.9*  ALBUMIN 3.9   < > 3.6  INR 1.3*  --   --    < > = values in this interval not displayed.   Recent Labs  Lab 10/16/18 1045  AST 17  ALT 13  ALKPHOS 58  BILITOT 1.1  PROT 7.0   Recent Labs  Lab 10/19/18 1045  WBC 4.7  HGB 10.0*  HCT 30.6*  PLT 74*

## 2018-10-19 NOTE — Progress Notes (Signed)
Patient is taking some of the binders and leaving some, saying they taste like chalk, patient is educated about the importance of taking the whole dose. Crushed the binders for him in apple sauce and he was able to take them. Will continue to reinforce.

## 2018-10-19 NOTE — Progress Notes (Signed)
PROGRESS NOTE  Jose Garrett YTK:[id number] DOB: [birth year] DOA: 10/17/2018 PCP: [name], MD  Brief History   Jose Garrett is a 68 y.o. male with medical history significant for chronic combined systolic and diastolic CHF (EF 55-73%), ESRD on MWF HD, CAD, severe aortic stenosis, PAD status post left femoropopliteal bypass, hypertension, hyperlipidemia, thrombocytopenia, and anxiety who presents the ED for evaluation of dyspnea.  Patient states he has baseline shortness of breath however had sudden worsening of dyspnea morning of 10/17/2018.  He states he becomes very short of breath with minimal exertion and has had paroxysmal nocturnal dyspnea.  He has noticed becoming generally weak especially at the end of dialysis sessions and reports that his blood pressure becomes low at the end of the sessions as well.  He has had a cough productive of occasional clear sputum.  He reports generalized weakness and some lightheadedness with activity and states that his balance has been off.  He denies any falls or loss of consciousness.  He denies any chest pain, palpitations, subjective fevers, diaphoresis, chills, abdominal pain.  He no longer makes urine.  He states he is having occasional loose but not watery stools.  Initial vitals showed BP 125/52, pulse 92, RR 20, temp 97.7 Fahrenheit, SPO2 96% on room air.  Labs are notable for WBC 4.2, hemoglobin 10.5, platelets 69,000, sodium 140, potassium 3.6, bicarb 26, BUN 40, creatinine 8.37, high-sensitivity troponin 85 > 77.  SARS-CoV-2 test was negative.  Portable chest x-ray showed enlarged cardiac silhouette with pulmonary venous congestion.  Patient was given aspirin 324 mg in the ED.  Cardiology were consulted and recommended admission for further management prior to potential TAVR procedure.  Nephrology were consulted and will see tomorrow for planned HD.  The hospitalist service was consulted to admit.  Jose Garrett is a 68 y.o. male with  medical history significant for chronic combined systolic and diastolic CHF (EF 22-02%), ESRD on MWF HD, CAD, severe aortic stenosis, PAD status post left femoropopliteal bypass, hypertension, hyperlipidemia, thrombocytopenia, and anxiety who is admitted with progressive dyspnea.  Consultants  . Nephrology . Cardiology  Procedures  . Dialysis  Antibiotics   Anti-infectives (From admission, onward)   None     Subjective  The patient is resting comfortably. No new complaints.   Objective   Vitals:  Vitals:   10/19/18 1340 10/19/18 1414  BP: (!) 100/55 101/68  Pulse: 86 79  Resp: 18   Temp: (!) 97.4 F (36.3 C) 97.8 F (36.6 C)  SpO2: 98% 94%    Exam:  Constitutional:  . The patient is awake, alert, and oriented x 3. No acute distress. Respiratory:  . No increased work of breathing. . No wheezes or rhonchi. . Mild rales present. . No tactile fremitus. Cardiovascular:  . Regular rate and rhythm. . No murmurs, ectopy, or gallups . No lateral PMI. No thrills. Abdomen:  . Abdomen is soft, non-tender, non-distended. . No hernias, masses, or organomegaly are appreciated. . Normoactive bowel sounds. Musculoskeletal:  . No cyanosis or clubbing . Positive for 2+ edema Skin:  . No rashes, lesions, ulcers . palpation of skin: no induration or nodules Neurologic:  . CN 2-12 intact . Sensation all 4 extremities intact Psychiatric:  . Mental status o Mood, affect appropriate o Orientation to person, place, time  . judgment and insight appear intact    I have personally reviewed the following:   Today's Data  . Vitals, BMP, CBC  Scheduled Meds: . aspirin EC  81 mg Oral QPM  . atorvastatin  80 mg Oral Q supper  . [START ON 10/20/2018] calcitRIOL  1.5 mcg Oral Q M,W,F-HD  . Chlorhexidine Gluconate Cloth  6 each Topical Q0600  . [START ON 10/20/2018] cinacalcet  180 mg Oral Q M,W,F-HD  . clopidogrel  75 mg Oral Q supper  . gabapentin  300 mg Oral Daily  .  heparin      . loratadine  10 mg Oral Daily  . midodrine  10 mg Oral Q M,W,F-HD  . multivitamin  1 tablet Oral QHS  . pantoprazole  40 mg Oral QPM  . sodium chloride flush  3 mL Intravenous Q12H  . sucroferric oxyhydroxide  1,500 mg Oral TID WC  . vitamin B-12  1,000 mcg Oral QPM   Continuous Infusions:  Principal Problem:   Dyspnea Active Problems:   Essential hypertension, benign   ESRD (end stage renal disease) on dialysis (HCC)   Thrombocytopenia (HCC)   Chronic combined systolic and diastolic congestive heart failure (HCC)   Coronary artery disease involving native coronary artery of native heart without angina pectoris   Aortic stenosis   Anxiety   LOS: 2 days   A & P   Dyspnea: Multifactorial from symptomatic aortic stenosis, pulmonary edema, and progressive deconditioning from his chronic comorbidities.  Cardiology and nephrology following. Plan for volume control with HD today per nephrology  ESRD on MWF HD: Nephrology consulted by EDP, plan for HD today. The patient will be continued on midodrine with HD. Pt had dialysis today.  Chronic combined systolic and diastolic CHF: EF 07-86% by TTE 07/25/2018.  He does have rales and edema on both legs.  Volume is controlled by dialysis.  He is previously on Lopressor, however states he is not taking it.  Consider adding back if BP allows. TSH normal.  Severe aortic stenosis: Has plans for TAVR on 10/21/2018.  Cardiology has seen patient and will notify TAVR team will see patient on Monday.  CAD: Severe single-vessel CAD with chronic total occlusion of the RCA noted on left heart cath 07/24/2018.  Patient denies any chest pain.  High-sensitivity troponin minimally elevated and has trended down on repeat. The patient will be continued on aspirin, Plavix, atorvastatin as at home.  PAD status post left femoropopliteal bypass: Continue aspirin, Plavix, atorvastatin.  Hypertension: Actually on midodrine for hypotension prior to  HD. No longer on antihypertensives. Monitor.  Thrombocytopenia: Chronic and stable compared to recent labs.  He denies any obvious bleeding.  Monitor closely while on aspirin and Plavix.  Hyperlipidemia: Continue atorvastatin.  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 PT/OT eval and treat.  I have seen and examined this patient myself. I have spent 30 minutes in his evaluation and care.  DVT prophylaxis: SCDs Code Status: Full code, confirmed with patient Family Communication: Discussed with patient, he has discussed with daughter Disposition Plan: Pending clinical progress   Illyria Sobocinski, DO [facility] Direct contact: see [email or web address]  7PM-7AM contact night coverage as above 10/19/2018, 4:42 PM  LOS: 1 day

## 2018-10-19 NOTE — Progress Notes (Signed)
Report given to dialysis RN

## 2018-10-19 NOTE — Progress Notes (Signed)
Subjective:   Less dyspnea   Objective:  Vitals:   10/18/18 0843 10/18/18 1943 10/18/18 2130 10/19/18 0538  BP: 93/65 (!) 89/55 108/79 (!) 119/44  Pulse: 88 84 (!) 102 (!) 105  Resp: 18 18  18   Temp: (!) 97.4 F (36.3 C) 98.4 F (36.9 C)  (!) 97.5 F (36.4 C)  TempSrc:  Oral  Oral  SpO2: 97% 94%    Weight:    85.5 kg  Height:        Intake/Output from previous day:  Intake/Output Summary (Last 24 hours) at 10/19/2018 8341 Last data filed at 10/19/2018 0700 Gross per 24 hour  Intake 920 ml  Output 300 ml  Net 620 ml    Physical Exam:  Affect appropriate Chronically ill male  HEENT: normal Neck supple with no adenopathy JVP normal no bruits no thyromegaly Lungs basilar rales  no wheezing and good diaphragmatic motion Heart:  S1/S2 AS murmur, no rub, gallop or click PMI normal Abdomen: benighn, BS positve, no tenderness, no AAA no bruit.  No HSM or HJR Distal pulses intact with no bruits Plus 2 LE edema with left fem pop  Neuro non-focal Skin warm and dry No muscular weakness Fistula RUE   Lab Results: Basic Metabolic Panel: Recent Labs    10/17/18 1318 10/18/18 0334  NA 140 139  K 3.6 3.8  CL 95* 94*  CO2 26 26  GLUCOSE 112* 87  BUN 40* 48*  CREATININE 8.37* 9.23*  CALCIUM 9.3 8.7*  PHOS  --  7.0*   Liver Function Tests: Recent Labs    10/16/18 1045 10/18/18 0334  AST 17  --   ALT 13  --   ALKPHOS 58  --   BILITOT 1.1  --   PROT 7.0  --   ALBUMIN 3.9 3.4*   No results for input(s): LIPASE, AMYLASE in the last 72 hours. CBC: Recent Labs    10/17/18 1318 10/18/18 0334  WBC 4.2 3.9*  HGB 10.5* 9.3*  HCT 33.4* 28.7*  MCV 108.1* 104.4*  PLT 69* 59*   Cardiac Enzymes: No results for input(s): CKTOTAL, CKMB, CKMBINDEX, TROPONINI in the last 72 hours. BNP: Invalid input(s): POCBNP D-Dimer: No results for input(s): DDIMER in the last 72 hours. Hemoglobin A1C: Recent Labs    10/16/18 1045  HGBA1C 5.0   Fasting Lipid Panel: No  results for input(s): CHOL, HDL, LDLCALC, TRIG, CHOLHDL, LDLDIRECT in the last 72 hours. Thyroid Function Tests: Recent Labs    10/18/18 0334  TSH 1.662   Anemia Panel: No results for input(s): VITAMINB12, FOLATE, FERRITIN, TIBC, IRON, RETICCTPCT in the last 72 hours.  Imaging: Dg Chest Portable 1 View  Result Date: 10/17/2018 CLINICAL DATA:  Intermittent chest pain with exertion. EXAM: PORTABLE CHEST 1 VIEW COMPARISON:  October 16, 2018 FINDINGS: No pneumothorax. Stable cardiomegaly. The hila and mediastinum are unremarkable. No pulmonary nodules or masses. No focal infiltrates. No overt edema. Mild pulmonary venous congestion not excluded. IMPRESSION: Cardiomegaly. Suspected mild pulmonary venous congestion. No other abnormalities. Electronically Signed   By: Dorise Bullion III M.D   On: 10/17/2018 16:52    Cardiac Studies:  ECG:  SR RBBB ectopy    Telemetry:  SR PAC;s PVCls   Echo: EF 35-40% low gradient severe AS mean gradient 30 peak 54 mmHg  Medications:   . aspirin EC  81 mg Oral QPM  . atorvastatin  80 mg Oral Q supper  . [START ON 10/20/2018] calcitRIOL  1.5 mcg  Oral Q M,W,F-HD  . Chlorhexidine Gluconate Cloth  6 each Topical Q0600  . [START ON 10/20/2018] cinacalcet  180 mg Oral Q M,W,F-HD  . clopidogrel  75 mg Oral Q supper  . gabapentin  300 mg Oral Daily  . heparin  5,000 Units Dialysis Once in dialysis  . loratadine  10 mg Oral Daily  . midodrine  10 mg Oral Q M,W,F-HD  . multivitamin  1 tablet Oral QHS  . pantoprazole  40 mg Oral QPM  . sodium chloride flush  3 mL Intravenous Q12H  . sucroferric oxyhydroxide  1,500 mg Oral TID WC  . vitamin B-12  1,000 mcg Oral QPM      Assessment/Plan:   AS:  Severe for TAVR Tuesday came in with mild CHF and failure to thrive ? Cardiac cachexia Has not had dialysis Friday or [place] [facility] has seen but not done yet  Cath 07/24/18 with known occluded and collateralized RCA stable  Will have TAVR team see on Monday Give  midodrine this am Prior to dialysis as BP runs low    Jenkins Rouge 10/19/2018, 8:21 AM

## 2018-10-19 NOTE — Progress Notes (Signed)
OT Cancellation Note  Patient Details Name: Jose Garrett MRN: [id number] DOB: 16-Dec-1950   Cancelled Treatment:    Reason Eval/Treat Not Completed: Patient at procedure or test/ unavailable.  Pt currently in HD.  Will reattempt.  Lucille Passy, OTR/L Acute Rehabilitation Services Pager [phone] Office [phone]   Lucille Passy M 10/19/2018, 1:52 PM

## 2018-10-20 DIAGNOSIS — I5042 Chronic combined systolic (congestive) and diastolic (congestive) heart failure: Secondary | ICD-10-CM

## 2018-10-20 DIAGNOSIS — I2510 Atherosclerotic heart disease of native coronary artery without angina pectoris: Secondary | ICD-10-CM

## 2018-10-20 LAB — CBC WITH DIFFERENTIAL/PLATELET
Abs Immature Granulocytes: 0.01 10*3/uL (ref 0.00–0.07)
Basophils Absolute: 0 10*3/uL (ref 0.0–0.1)
Basophils Relative: 1 %
Eosinophils Absolute: 0.1 10*3/uL (ref 0.0–0.5)
Eosinophils Relative: 2 %
HCT: 29.1 % — ABNORMAL LOW (ref 39.0–52.0)
Hemoglobin: 9.3 g/dL — ABNORMAL LOW (ref 13.0–17.0)
Immature Granulocytes: 0 %
Lymphocytes Relative: 21 %
Lymphs Abs: 0.9 10*3/uL (ref 0.7–4.0)
MCH: 34.1 pg — ABNORMAL HIGH (ref 26.0–34.0)
MCHC: 32 g/dL (ref 30.0–36.0)
MCV: 106.6 fL — ABNORMAL HIGH (ref 80.0–100.0)
Monocytes Absolute: 0.4 10*3/uL (ref 0.1–1.0)
Monocytes Relative: 9 %
Neutro Abs: 2.8 10*3/uL (ref 1.7–7.7)
Neutrophils Relative %: 67 %
Platelets: 64 10*3/uL — ABNORMAL LOW (ref 150–400)
RBC: 2.73 MIL/uL — ABNORMAL LOW (ref 4.22–5.81)
RDW: 17.3 % — ABNORMAL HIGH (ref 11.5–15.5)
WBC: 4.2 10*3/uL (ref 4.0–10.5)
nRBC: 0 % (ref 0.0–0.2)

## 2018-10-20 LAB — HEPATITIS B SURFACE ANTIGEN: Hepatitis B Surface Ag: NEGATIVE

## 2018-10-20 LAB — BASIC METABOLIC PANEL
Anion gap: 16 — ABNORMAL HIGH (ref 5–15)
BUN: 37 mg/dL — ABNORMAL HIGH (ref 8–23)
CO2: 26 mmol/L (ref 22–32)
Calcium: 9.1 mg/dL (ref 8.9–10.3)
Chloride: 97 mmol/L — ABNORMAL LOW (ref 98–111)
Creatinine, Ser: 7.88 mg/dL — ABNORMAL HIGH (ref 0.61–1.24)
GFR calc Af Amer: 7 mL/min — ABNORMAL LOW (ref >60)
GFR calc non Af Amer: 6 mL/min — ABNORMAL LOW (ref >60)
Glucose, Bld: 101 mg/dL — ABNORMAL HIGH (ref 70–99)
Potassium: 4 mmol/L (ref 3.5–5.1)
Sodium: 139 mmol/L (ref 135–145)

## 2018-10-20 LAB — HEPATITIS B E ANTIGEN: Hep B E Ag: NEGATIVE

## 2018-10-20 MED ORDER — NOREPINEPHRINE 4 MG/250ML-% IV SOLN
0.0000 ug/min | INTRAVENOUS | Status: AC
  Administered 2018-10-21: 2 ug/min via INTRAVENOUS
  Filled 2018-10-20: qty 250

## 2018-10-20 MED ORDER — VANCOMYCIN HCL 10 G IV SOLR
1500.0000 mg | INTRAVENOUS | Status: AC
  Administered 2018-10-21: 1500 mg via INTRAVENOUS
  Filled 2018-10-20: qty 1500

## 2018-10-20 MED ORDER — DEXMEDETOMIDINE HCL IN NACL 400 MCG/100ML IV SOLN
0.1000 ug/kg/h | INTRAVENOUS | Status: AC
  Administered 2018-10-21: 1 ug/kg/h via INTRAVENOUS
  Filled 2018-10-20: qty 100

## 2018-10-20 MED ORDER — SODIUM CHLORIDE 0.9 % IV SOLN
1.5000 g | INTRAVENOUS | Status: AC
  Administered 2018-10-21: 1.5 g via INTRAVENOUS
  Filled 2018-10-20: qty 1.5

## 2018-10-20 MED ORDER — BISACODYL 5 MG PO TBEC
5.0000 mg | DELAYED_RELEASE_TABLET | Freq: Once | ORAL | Status: DC
  Filled 2018-10-20: qty 1

## 2018-10-20 MED ORDER — CHLORHEXIDINE GLUCONATE 0.12 % MT SOLN
15.0000 mL | Freq: Once | OROMUCOSAL | Status: AC
  Administered 2018-10-21: 15 mL via OROMUCOSAL
  Filled 2018-10-20: qty 15

## 2018-10-20 MED ORDER — TEMAZEPAM 15 MG PO CAPS: 15.0000 mg | ORAL_CAPSULE | Freq: Once | ORAL | Status: DC | PRN

## 2018-10-20 MED ORDER — SODIUM CHLORIDE 0.9 % IV SOLN
INTRAVENOUS | Status: DC
  Filled 2018-10-20: qty 30

## 2018-10-20 MED ORDER — CHLORHEXIDINE GLUCONATE CLOTH 2 % EX PADS
6.0000 | MEDICATED_PAD | Freq: Every day | CUTANEOUS | Status: DC
  Administered 2018-10-22 – 2018-10-26 (×4): 6 via TOPICAL

## 2018-10-20 MED ORDER — CHLORHEXIDINE GLUCONATE 4 % EX LIQD: 1.0000 "application " | Freq: Once | CUTANEOUS | Status: DC

## 2018-10-20 MED ORDER — POTASSIUM CHLORIDE 2 MEQ/ML IV SOLN
80.0000 meq | INTRAVENOUS | Status: DC
  Filled 2018-10-20: qty 40

## 2018-10-20 MED ORDER — MAGNESIUM SULFATE 50 % IJ SOLN
40.0000 meq | INTRAMUSCULAR | Status: DC
  Filled 2018-10-20: qty 9.85

## 2018-10-20 NOTE — Progress Notes (Signed)
  [facility]   MULTIDISCIPLINARY HEART VALVE TEAM  Inpatient TAVR orders placed for TAVR tomorrow with Dr. Burt Knack and Dr. Roxy Manns.  Angelena Form PA-C  [facility]

## 2018-10-20 NOTE — Progress Notes (Signed)
PROGRESS NOTE  Jose Garrett [id number]:[id number] DOB: [birth year] DOA: 10/17/2018 PCP: [name], MD  Brief History   PEARSON PICOU is a 68 y.o. male with medical history significant for chronic combined systolic and diastolic CHF (EF 27-03%), ESRD on MWF HD, CAD, severe aortic stenosis, PAD status post left femoropopliteal bypass, hypertension, hyperlipidemia, thrombocytopenia, and anxiety who presents the ED for evaluation of dyspnea.  Patient states he has baseline shortness of breath however had sudden worsening of dyspnea morning of 10/17/2018.  He states he becomes very short of breath with minimal exertion and has had paroxysmal nocturnal dyspnea.  He has noticed becoming generally weak especially at the end of dialysis sessions and reports that his blood pressure becomes low at the end of the sessions as well.  He has had a cough productive of occasional clear sputum.  He reports generalized weakness and some lightheadedness with activity and states that his balance has been off.  He denies any falls or loss of consciousness.  He denies any chest pain, palpitations, subjective fevers, diaphoresis, chills, abdominal pain.  He no longer makes urine.  He states he is having occasional loose but not watery stools.  Initial vitals showed BP 125/52, pulse 92, RR 20, temp 97.7 Fahrenheit, SPO2 96% on room air.  Labs are notable for WBC 4.2, hemoglobin 10.5, platelets 69,000, sodium 140, potassium 3.6, bicarb 26, BUN 40, creatinine 8.37, high-sensitivity troponin 85 > 77.  SARS-CoV-2 test was negative.  Portable chest x-ray showed enlarged cardiac silhouette with pulmonary venous congestion.  Patient was given aspirin 324 mg in the ED.  Cardiology were consulted and recommended admission for further management prior to potential TAVR procedure.  Nephrology were consulted and will see tomorrow for planned HD.  The hospitalist service was consulted to admit.  Jose Garrett is a 68 y.o. male with  medical history significant for chronic combined systolic and diastolic CHF (EF 50-09%), ESRD on MWF HD, CAD, severe aortic stenosis, PAD status post left femoropopliteal bypass, hypertension, hyperlipidemia, thrombocytopenia, and anxiety who is admitted with progressive dyspnea.  Plan is for the patient to go for TAVR tomorrow.  Consultants  . Nephrology . Cardiology  Procedures  . Dialysis  Antibiotics   Anti-infectives (From admission, onward)   Start     Dose/Rate Route Frequency Ordered Stop   10/21/18 0400  vancomycin (VANCOCIN) 1,500 mg in sodium chloride 0.9 % 250 mL IVPB     1,500 mg 125 mL/hr over 120 Minutes Intravenous To Surgery 10/20/18 1055 10/22/18 0400   10/21/18 0400  cefUROXime (ZINACEF) 1.5 g in sodium chloride 0.9 % 100 mL IVPB     1.5 g 200 mL/hr over 30 Minutes Intravenous To Surgery 10/20/18 1055 10/22/18 0400     Subjective  The patient is resting comfortably. No new complaints.   Objective   Vitals:  Vitals:   10/20/18 1700 10/20/18 1730  BP: (P) 98/67 (!) (P) 118/47  Pulse: (P) 86 (P) 83  Resp: (P) 16 (P) 17  Temp:  (P) 98.3 F (36.8 C)  SpO2:  (P) 95%    Exam:  Constitutional:  . The patient is awake, alert, and oriented x 3. No acute distress. Respiratory:  . No increased work of breathing. . No wheezes or rhonchi. . Mild rales present. . No tactile fremitus. Cardiovascular:  . Regular rate and rhythm. . No murmurs, ectopy, or gallups . No lateral PMI. No thrills. Abdomen:  . Abdomen is soft, non-tender, non-distended. . No hernias,  masses, or organomegaly are appreciated. . Normoactive bowel sounds. Musculoskeletal:  . No cyanosis or clubbing . Positive for 2+ edema Skin:  . No rashes, lesions, ulcers . palpation of skin: no induration or nodules Neurologic:  . CN 2-12 intact . Sensation all 4 extremities intact Psychiatric:  . Mental status o Mood, affect appropriate o Orientation to person, place, time  . judgment  and insight appear intact    I have personally reviewed the following:   Today's Data  . Vitals, BMP, CBC  Scheduled Meds: . aspirin EC  81 mg Oral QPM  . atorvastatin  80 mg Oral Q supper  . calcitRIOL  1.5 mcg Oral Q M,W,F-HD  . Chlorhexidine Gluconate Cloth  6 each Topical Q0600  . cinacalcet  180 mg Oral Q M,W,F-HD  . clopidogrel  75 mg Oral Q supper  . gabapentin  300 mg Oral Daily  . loratadine  10 mg Oral Daily  . [START ON 10/21/2018] magnesium sulfate  40 mEq Other To OR  . midodrine  10 mg Oral Q M,W,F-HD  . multivitamin  1 tablet Oral QHS  . pantoprazole  40 mg Oral QPM  . [START ON 10/21/2018] potassium chloride  80 mEq Other To OR  . sodium chloride flush  3 mL Intravenous Q12H  . sucroferric oxyhydroxide  1,500 mg Oral TID WC  . vitamin B-12  1,000 mcg Oral QPM   Continuous Infusions: . [START ON 10/21/2018] cefUROXime (ZINACEF)  IV    . [START ON 10/21/2018] dexmedetomidine    . [START ON 10/21/2018] heparin 30,000 units/NS 1000 mL solution for CELLSAVER    . [START ON 10/21/2018] norepinephrine (LEVOPHED) Adult infusion    . [START ON 10/21/2018] vancomycin      Principal Problem:   Dyspnea Active Problems:   Essential hypertension, benign   ESRD (end stage renal disease) on dialysis (HCC)   Thrombocytopenia (HCC)   Chronic combined systolic and diastolic congestive heart failure (HCC)   Coronary artery disease involving native coronary artery of native heart without angina pectoris   Aortic stenosis   Anxiety   LOS: 3 days   A & P   Dyspnea: Multifactorial from symptomatic aortic stenosis, pulmonary edema, and progressive deconditioning from his chronic comorbidities.  Cardiology and nephrology following. Plan for volume control with HD today per nephrology  ESRD on MWF HD: Nephrology consulted by EDP, plan for HD today. The patient will be continued on midodrine with HD. Pt had dialysis today.  Chronic combined systolic and diastolic CHF: EF 37-85% by  TTE 07/25/2018.  He does have rales and edema on both legs.  Volume is controlled by dialysis.  He is previously on Lopressor, however states he is not taking it.  Consider adding back if BP allows. TSH normal.  Severe aortic stenosis: Has plans for TAVR on 10/21/2018. Cardiology has been consulted and the patient has been seen by the TAVR team. He will go for TAVR tomorrow.  CAD: Severe single-vessel CAD with chronic total occlusion of the RCA noted on left heart cath 07/24/2018.  Patient denies any chest pain.  High-sensitivity troponin minimally elevated and has trended down on repeat. The patient will be continued on aspirin, Plavix, atorvastatin as at home.  PAD status post left femoropopliteal bypass: Continue aspirin, Plavix, atorvastatin.  Hypertension: Actually on midodrine for hypotension prior to HD. No longer on antihypertensives. Monitor.  Thrombocytopenia: Chronic and stable compared to recent labs.  He denies any obvious bleeding.  Monitor closely while  on aspirin and Plavix.  Hyperlipidemia: Continue atorvastatin.  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 PT/OT eval and treat.  I have seen and examined this patient myself. I have spent 32 minutes in his evaluation and care.  DVT prophylaxis: SCDs Code Status: Full code, confirmed with patient Family Communication: Discussed with patient, he has discussed with daughter Disposition Plan: Pending clinical progress   Linden Tagliaferro, DO [facility] Direct contact: see [email or web address]  7PM-7AM contact night coverage as above 10/20/2018, 6:18 PM  LOS: 1 day

## 2018-10-20 NOTE — Progress Notes (Signed)
[facility] Progress Note  Subjective: feeling well today, TAVR scheduled for tomorrow.  3hr tx yesterday went well.    Vitals:   10/19/18 1414 10/19/18 2027 10/20/18 0428 10/20/18 0706  BP: 101/68 92/61  (!) 102/57  Pulse: 79 83  100  Resp:  18  18  Temp: 97.8 F (36.6 C) 97.8 F (36.6 C)  98 F (36.7 C)  TempSrc: Oral Oral  Oral  SpO2: 94% 98%  96%  Weight:   85.4 kg   Height:        Inpatient medications: . aspirin EC  81 mg Oral QPM  . atorvastatin  80 mg Oral Q supper  . calcitRIOL  1.5 mcg Oral Q M,W,F-HD  . Chlorhexidine Gluconate Cloth  6 each Topical Q0600  . cinacalcet  180 mg Oral Q M,W,F-HD  . clopidogrel  75 mg Oral Q supper  . gabapentin  300 mg Oral Daily  . loratadine  10 mg Oral Daily  . midodrine  10 mg Oral Q M,W,F-HD  . multivitamin  1 tablet Oral QHS  . pantoprazole  40 mg Oral QPM  . sodium chloride flush  3 mL Intravenous Q12H  . sucroferric oxyhydroxide  1,500 mg Oral TID WC  . vitamin B-12  1,000 mcg Oral QPM    acetaminophen **OR** acetaminophen, LORazepam, oxyCODONE    Exam: General: Well developed, well nourished, in no acute distress Head: Normocephalic, atraumatic, sclera non-icteric Neck:  JVD not elevated. Lungs: Decreased lung sounds at bases. No wheezing, rhonchi or rales Heart: RRR with normal S1, S2. 3/6 systolic murmur Abdomen: Soft, non-tender, non-distended with normoactive bowel sounds Lower extremities: no edema Neuro: Alert and oriented X 3. Moves all extremities spontaneously. Dialysis Access: RUE AVF +t/b  Dialysis:  [facility] MWF  4h  400/800  82.5kg  2/2 bath  Hep 8000  RUE AVF Sensipar 180mg  PO TIW with dialysis Mircera 190mcg IV q2weeks (last given 10/15/2018) Calcitriol 1.5 mcg PO TIW velphoro 3 tabs PO TID AC and 2/snack  Recent labs:  Hgb 10.4, Tsat 29; Ferritin 1303; Plt 62K; K+ 4.6; Albumin 4.1; calcium 9.4; {hps 5.8; PTH 615   Assessment/Plan: 1.  Dyspnea: Improved s/p UF here.  He plans to  ambulate today to test for DOE.  2.  Severe AS: for TAVR procedure this week, per Cardiology. Scheduled for tomorrow per Mr. Irven [place] report. 3.  ESRD:  MWF, missed dialysis Friday. Back on schedule for HD today to optimize prior to TAVR tomorrow.   4.  Hypertension/volume:  Continue midodrine before dialysis.   5.  Anemia: Hgb 9.3. Not due for ESA yet.  Last iron level ok.   6.  Metabolic bone disease: Calcium 8.7. Recent PTH elevated. Will continue sensipar and calcitriol. Phos 7.0- continue home binder.  7.  Nutrition:  Renal diet with fluid restrictions 8. Aortic stenosis: Plan for TAVR 10/21/2018. Per cardiology 9. Thrombocytopenia: BP stable, is on aspirin and plavix. Has tolerated heparin with dialysis at outpatient unit.    Jannifer Hick MD [facility] Pager [phone]  Iron/TIBC/Ferritin/ %Sat    Component Value Date/Time   IRON 44 (L) 08/06/2018 1410   IRON 34 (L) 09/24/2014 1224   TIBC 190 (L) 08/06/2018 1410   TIBC 284 09/24/2014 1224   FERRITIN 974 (H) 08/06/2018 1410   FERRITIN 335 (H) 12/20/2015 1009   IRONPCTSAT 23 08/06/2018 1410   IRONPCTSAT 12 (L) 09/24/2014 1224   IRONPCTSAT 36 03/02/2014 1227   Recent Labs  Lab 10/16/18  1045  10/19/18 1044 10/20/18 0638  NA 138   < > 139 139  K 3.7   < > 4.0 4.0  CL 96*   < > 97* 97*  CO2 26   < > 22 26  GLUCOSE 98   < > 121* 101*  BUN 26*   < > 65* 37*  CREATININE 6.35*   < > 10.77* 7.88*  CALCIUM 9.6   < > 9.2 9.1  PHOS  --    < > 6.9*  --   ALBUMIN 3.9   < > 3.6  --   INR 1.3*  --   --   --    < > = values in this interval not displayed.   Recent Labs  Lab 10/16/18 1045  AST 17  ALT 13  ALKPHOS 58  BILITOT 1.1  PROT 7.0   Recent Labs  Lab 10/20/18 0638  WBC 4.2  HGB 9.3*  HCT 29.1*  PLT 64*

## 2018-10-20 NOTE — Progress Notes (Signed)
OT Cancellation Note  Patient Details Name: Jose Garrett MRN: [id number] DOB: 1950/09/30   Cancelled Treatment:    Reason Eval/Treat Not Completed: Patient at procedure or test/ unavailable. Pt off unit at HD.   Delight Stare, OT Acute Rehabilitation Services Pager [phone] Office [phone]   Delight Stare 10/20/2018, 2:55 PM

## 2018-10-20 NOTE — Progress Notes (Signed)
Progress Note  Patient Name: Jose Garrett Date of Encounter: 10/20/2018  Primary Cardiologist: Lauree Chandler, MD   Subjective   Feels well today. No chest pain or dyspnea.  Inpatient Medications    Scheduled Meds: . aspirin EC  81 mg Oral QPM  . atorvastatin  80 mg Oral Q supper  . calcitRIOL  1.5 mcg Oral Q M,W,F-HD  . Chlorhexidine Gluconate Cloth  6 each Topical Q0600  . cinacalcet  180 mg Oral Q M,W,F-HD  . clopidogrel  75 mg Oral Q supper  . gabapentin  300 mg Oral Daily  . loratadine  10 mg Oral Daily  . midodrine  10 mg Oral Q M,W,F-HD  . multivitamin  1 tablet Oral QHS  . pantoprazole  40 mg Oral QPM  . sodium chloride flush  3 mL Intravenous Q12H  . sucroferric oxyhydroxide  1,500 mg Oral TID WC  . vitamin B-12  1,000 mcg Oral QPM   Continuous Infusions:  PRN Meds: acetaminophen **OR** acetaminophen, LORazepam, oxyCODONE   Vital Signs    Vitals:   10/19/18 1414 10/19/18 2027 10/20/18 0428 10/20/18 0706  BP: 101/68 92/61  (!) 102/57  Pulse: 79 83  100  Resp:  18  18  Temp: 97.8 F (36.6 C) 97.8 F (36.6 C)  98 F (36.7 C)  TempSrc: Oral Oral  Oral  SpO2: 94% 98%  96%  Weight:   85.4 kg   Height:        Intake/Output Summary (Last 24 hours) at 10/20/2018 0911 Last data filed at 10/20/2018 7510 Gross per 24 hour  Intake 240 ml  Output 1000 ml  Net -760 ml   Last 3 Weights 10/20/2018 10/19/2018 10/19/2018  Weight (lbs) 188 lb 4.8 oz 187 lb 6.3 oz 188 lb 11.4 oz  Weight (kg) 85.412 kg 85 kg 85.6 kg      Telemetry    NSR with PACs - Personally Reviewed  ECG    None today - Personally Reviewed  Physical Exam   GEN: No acute distress.   Neck: No JVD Cardiac: RRR, AS murmur Respiratory: Clear to auscultation bilaterally. GI: Soft, nontender, non-distended  MS: No edema; No deformity. AV fistula right arm Neuro:  Nonfocal  Psych: Normal affect   Labs    High Sensitivity Troponin:   Recent Labs  Lab 10/17/18 1318 10/17/18 1737   TROPONINIHS 85* 77*      Cardiac EnzymesNo results for input(s): TROPONINI in the last 168 hours. No results for input(s): TROPIPOC in the last 168 hours.   Chemistry Recent Labs  Lab 10/16/18 1045  10/18/18 0334 10/19/18 0722 10/19/18 1044 10/20/18 0638  NA 138   < > 139 139 139 139  K 3.7   < > 3.8 4.2 4.0 4.0  CL 96*   < > 94* 94* 97* 97*  CO2 26   < > 26 25 22 26   GLUCOSE 98   < > 87 100* 121* 101*  BUN 26*   < > 48* 64* 65* 37*  CREATININE 6.35*   < > 9.23* 10.70* 10.77* 7.88*  CALCIUM 9.6   < > 8.7* 9.1 9.2 9.1  PROT 7.0  --   --   --   --   --   ALBUMIN 3.9  --  3.4*  --  3.6  --   AST 17  --   --   --   --   --   ALT 13  --   --   --   --   --  ALKPHOS 58  --   --   --   --   --   BILITOT 1.1  --   --   --   --   --   GFRNONAA 8*   < > 5* 4* 4* 6*  GFRAA 10*   < > 6* 5* 5* 7*  ANIONGAP 16*   < > 19* 20* 20* 16*   < > = values in this interval not displayed.     Hematology Recent Labs  Lab 10/18/18 0334 10/19/18 1045 10/20/18 0638  WBC 3.9* 4.7 4.2  RBC 2.75* 2.93* 2.73*  HGB 9.3* 10.0* 9.3*  HCT 28.7* 30.6* 29.1*  MCV 104.4* 104.4* 106.6*  MCH 33.8 34.1* 34.1*  MCHC 32.4 32.7 32.0  RDW 17.2* 17.1* 17.3*  PLT 59* 74* 64*    BNP Recent Labs  Lab 10/16/18 1045 10/17/18 1639  BNP 3,896.1* 1,967.8*     DDimer No results for input(s): DDIMER in the last 168 hours.   Radiology    No results found.  Cardiac Studies   Echo:IMPRESSIONS    1. The left ventricle has moderately reduced systolic function, with an ejection fraction of 35-40%. The cavity size was normal. There is moderately increased left ventricular wall thickness. Left ventricular diastolic Doppler parameters are consistent  with impaired relaxation. Elevated left atrial and left ventricular end-diastolic pressures The E/e' is >15.  2. Moderate akinesis of the left ventricular, entire inferior wall.  3. The right ventricle has normal systolic function. The cavity was normal.  There is no increase in right ventricular wall thickness.  4. The mitral valve is abnormal. Mild thickening of the mitral valve leaflet. There is mild mitral annular calcification present.  5. The tricuspid valve is grossly normal.  6. The aortic valve is tricuspid. Moderate calcification of the aortic valve. Aortic valve regurgitation is mild by color flow Doppler. Moderate-severe stenosis of the aortic valve.  7. The inferior vena cava was dilated in size with <50% respiratory variability.  8. The interatrial septum was not assessed.  9. When compared to the prior study: 03/24/2018: LVEF 40-45%, moderate aortic stenosis - mean gradient 30 mmHg.   Cardiac cath:  LEFT HEART CATH AND CORONARY ANGIOGRAPHY  Conclusion  1. Severe single vessel CAD with chronic total occlusion of the RCA, collateralized with L--->R collaterals 2. Mild calcific nonobstructive LCA disease, anatomy unchanged from previous cath studies 3. At least moderate AS, possibly severe LFLG - will check echo  Recommend: medical therapy, no target for PCI     Patient Profile     68 y.o. male  With ESRD, CAD with occluded RCA with R>L collaterals presents with progressive AS and failure to thrive.  Assessment & Plan    1. Severe AS. Low gradient. Stage D. Plan for TAVR tomorrow. 2. ESRD on HD. Plan dialysis today 3. CAD with occluded RCA with R>L collaterals.  4. Chronic systolic/diastolic CHF 5. HTN      For questions or updates, please contact [place] Please consult [email or web address] for contact info under        Signed, Arisa Congleton [place], MD  10/20/2018, 9:11 AM

## 2018-10-21 ENCOUNTER — Inpatient Hospital Stay (HOSPITAL_COMMUNITY): Payer: Medicare Other

## 2018-10-21 ENCOUNTER — Encounter (HOSPITAL_COMMUNITY)
Admission: EM | Disposition: A | Discharge status: Home / Self Care | Payer: Self-pay | Source: Home / Self Care | Attending: Cardiovascular Disease

## 2018-10-21 ENCOUNTER — Other Ambulatory Visit: Payer: Self-pay

## 2018-10-21 ENCOUNTER — Encounter (HOSPITAL_COMMUNITY): Payer: Self-pay | Admitting: Orthopedic Surgery

## 2018-10-21 ENCOUNTER — Other Ambulatory Visit: Payer: Self-pay | Admitting: Physician Assistant

## 2018-10-21 ENCOUNTER — Inpatient Hospital Stay (HOSPITAL_COMMUNITY): Payer: Medicare Other | Admitting: Physician Assistant

## 2018-10-21 ENCOUNTER — Inpatient Hospital Stay (HOSPITAL_COMMUNITY): Admission: RE | Admit: 2018-10-21 | Payer: Medicare Other | Source: Home / Self Care | Admitting: Cardiovascular Disease

## 2018-10-21 DIAGNOSIS — Z006 Encounter for examination for normal comparison and control in clinical research program: Secondary | ICD-10-CM

## 2018-10-21 DIAGNOSIS — I350 Nonrheumatic aortic (valve) stenosis: Secondary | ICD-10-CM

## 2018-10-21 DIAGNOSIS — Z952 Presence of prosthetic heart valve: Secondary | ICD-10-CM

## 2018-10-21 HISTORY — DX: Presence of prosthetic heart valve: Z95.2

## 2018-10-21 HISTORY — PX: INTRAOPERATIVE TRANSTHORACIC ECHOCARDIOGRAM: SHX6523

## 2018-10-21 HISTORY — PX: TRANSCATHETER AORTIC VALVE REPLACEMENT, TRANSFEMORAL: SHX6400

## 2018-10-21 LAB — POCT I-STAT, CHEM 8
BUN: 27 mg/dL — ABNORMAL HIGH (ref 8–23)
Calcium, Ion: 1.15 mmol/L (ref 1.15–1.40)
Chloride: 99 mmol/L (ref 98–111)
Creatinine, Ser: 6.9 mg/dL — ABNORMAL HIGH (ref 0.61–1.24)
Glucose, Bld: 96 mg/dL (ref 70–99)
HCT: 29 % — ABNORMAL LOW (ref 39.0–52.0)
Hemoglobin: 9.9 g/dL — ABNORMAL LOW (ref 13.0–17.0)
Potassium: 4.5 mmol/L (ref 3.5–5.1)
Sodium: 139 mmol/L (ref 135–145)
TCO2: 28 mmol/L (ref 22–32)

## 2018-10-21 LAB — BASIC METABOLIC PANEL
Anion gap: 15 (ref 5–15)
BUN: 24 mg/dL — ABNORMAL HIGH (ref 8–23)
CO2: 28 mmol/L (ref 22–32)
Calcium: 9.6 mg/dL (ref 8.9–10.3)
Chloride: 96 mmol/L — ABNORMAL LOW (ref 98–111)
Creatinine, Ser: 6 mg/dL — ABNORMAL HIGH (ref 0.61–1.24)
GFR calc Af Amer: 10 mL/min — ABNORMAL LOW (ref >60)
GFR calc non Af Amer: 9 mL/min — ABNORMAL LOW (ref >60)
Glucose, Bld: 100 mg/dL — ABNORMAL HIGH (ref 70–99)
Potassium: 4.5 mmol/L (ref 3.5–5.1)
Sodium: 139 mmol/L (ref 135–145)

## 2018-10-21 LAB — ECHOCARDIOGRAM LIMITED
Height: 70 in
Weight: 2966.4 oz

## 2018-10-21 LAB — TYPE AND SCREEN
ABO/RH(D): O POS
ABO/RH(D): O POS
Antibody Screen: NEGATIVE
Antibody Screen: NEGATIVE

## 2018-10-21 SURGERY — IMPLANTATION, AORTIC VALVE, TRANSCATHETER, FEMORAL APPROACH
Anesthesia: Monitor Anesthesia Care | Site: Chest

## 2018-10-21 MED ORDER — 0.9 % SODIUM CHLORIDE (POUR BTL) OPTIME
TOPICAL | Status: DC | PRN
  Administered 2018-10-21: 1000 mL

## 2018-10-21 MED ORDER — SODIUM CHLORIDE 0.9 % IV SOLN
INTRAVENOUS | Status: DC
  Administered 2018-10-21 (×2): via INTRAVENOUS

## 2018-10-21 MED ORDER — SODIUM CHLORIDE 0.9% FLUSH
3.0000 mL | Freq: Two times a day (BID) | INTRAVENOUS | Status: DC
  Administered 2018-10-22 – 2018-10-28 (×10): 3 mL via INTRAVENOUS

## 2018-10-21 MED ORDER — FENTANYL CITRATE (PF) 250 MCG/5ML IJ SOLN
INTRAMUSCULAR | Status: AC
  Filled 2018-10-21: qty 5

## 2018-10-21 MED ORDER — EPHEDRINE SULFATE-NACL 50-0.9 MG/10ML-% IV SOSY
PREFILLED_SYRINGE | INTRAVENOUS | Status: DC | PRN
  Administered 2018-10-21: 10 mg via INTRAVENOUS
  Administered 2018-10-21 (×2): 5 mg via INTRAVENOUS

## 2018-10-21 MED ORDER — ACETAMINOPHEN 325 MG PO TABS: 650.0000 mg | ORAL_TABLET | Freq: Four times a day (QID) | ORAL | Status: DC | PRN

## 2018-10-21 MED ORDER — SODIUM CHLORIDE 0.9 % IV SOLN
1.5000 g | INTRAVENOUS | Status: AC
  Administered 2018-10-21 – 2018-10-22 (×2): 1.5 g via INTRAVENOUS
  Filled 2018-10-21 (×2): qty 1.5

## 2018-10-21 MED ORDER — VANCOMYCIN HCL IN DEXTROSE 1-5 GM/200ML-% IV SOLN
1000.0000 mg | Freq: Once | INTRAVENOUS | Status: AC
  Administered 2018-10-21: 1000 mg via INTRAVENOUS
  Filled 2018-10-21: qty 200

## 2018-10-21 MED ORDER — ACETAMINOPHEN 650 MG RE SUPP: 650.0000 mg | Freq: Four times a day (QID) | RECTAL | Status: DC | PRN

## 2018-10-21 MED ORDER — NITROGLYCERIN IN D5W 200-5 MCG/ML-% IV SOLN: 0.0000 ug/min | INTRAVENOUS | Status: DC

## 2018-10-21 MED ORDER — FENTANYL CITRATE (PF) 100 MCG/2ML IJ SOLN
INTRAMUSCULAR | Status: AC
  Administered 2018-10-21: 50 ug via INTRAVENOUS
  Filled 2018-10-21: qty 2

## 2018-10-21 MED ORDER — SODIUM CHLORIDE 0.9 % IV SOLN
INTRAVENOUS | Status: DC | PRN
  Administered 2018-10-21: 11:00:00 via INTRAVENOUS

## 2018-10-21 MED ORDER — SODIUM CHLORIDE 0.9 % IV SOLN
INTRAVENOUS | Status: DC | PRN
  Administered 2018-10-21: 500 mL

## 2018-10-21 MED ORDER — ONDANSETRON HCL 4 MG/2ML IJ SOLN
INTRAMUSCULAR | Status: AC
  Filled 2018-10-21: qty 2

## 2018-10-21 MED ORDER — SODIUM CHLORIDE 0.9 % IV SOLN
1.5000 g | Freq: Two times a day (BID) | INTRAVENOUS | Status: DC
  Filled 2018-10-21: qty 1.5

## 2018-10-21 MED ORDER — HEPARIN SODIUM (PORCINE) 1000 UNIT/ML IJ SOLN
INTRAMUSCULAR | Status: AC
  Filled 2018-10-21: qty 2

## 2018-10-21 MED ORDER — PHENYLEPHRINE HCL-NACL 20-0.9 MG/250ML-% IV SOLN
0.0000 ug/min | INTRAVENOUS | Status: DC
  Filled 2018-10-21: qty 250

## 2018-10-21 MED ORDER — FENTANYL CITRATE (PF) 100 MCG/2ML IJ SOLN
50.0000 ug | Freq: Once | INTRAMUSCULAR | Status: AC
  Administered 2018-10-21: 50 ug via INTRAVENOUS

## 2018-10-21 MED ORDER — PROTAMINE SULFATE 10 MG/ML IV SOLN
INTRAVENOUS | Status: DC | PRN
  Administered 2018-10-21: 10 mg via INTRAVENOUS
  Administered 2018-10-21: 30 mg via INTRAVENOUS
  Administered 2018-10-21: 20 mg via INTRAVENOUS
  Administered 2018-10-21 (×2): 30 mg via INTRAVENOUS

## 2018-10-21 MED ORDER — OXYCODONE HCL 5 MG PO TABS
5.0000 mg | ORAL_TABLET | ORAL | Status: DC | PRN
  Administered 2018-10-22 (×2): 10 mg via ORAL
  Administered 2018-10-23: 5 mg via ORAL
  Administered 2018-10-24: 10 mg via ORAL
  Administered 2018-10-27: 5 mg via ORAL
  Filled 2018-10-21 (×2): qty 2
  Filled 2018-10-21: qty 1
  Filled 2018-10-21: qty 2
  Filled 2018-10-21: qty 1

## 2018-10-21 MED ORDER — SODIUM CHLORIDE 0.9 % IV SOLN: INTRAVENOUS | Status: AC

## 2018-10-21 MED ORDER — ONDANSETRON HCL 4 MG/2ML IJ SOLN: 4.0000 mg | Freq: Four times a day (QID) | INTRAMUSCULAR | Status: DC | PRN

## 2018-10-21 MED ORDER — LIDOCAINE HCL 1 % IJ SOLN
INTRAMUSCULAR | Status: DC | PRN
  Administered 2018-10-21: 4 mL

## 2018-10-21 MED ORDER — HEPARIN SODIUM (PORCINE) 1000 UNIT/ML IJ SOLN
INTRAMUSCULAR | Status: DC | PRN
  Administered 2018-10-21: 12000 [IU] via INTRAVENOUS

## 2018-10-21 MED ORDER — MIDAZOLAM HCL 2 MG/2ML IJ SOLN
INTRAMUSCULAR | Status: AC
  Filled 2018-10-21: qty 2

## 2018-10-21 MED ORDER — ONDANSETRON HCL 4 MG/2ML IJ SOLN
INTRAMUSCULAR | Status: DC | PRN
  Administered 2018-10-21: 4 mg via INTRAVENOUS

## 2018-10-21 MED ORDER — IODIXANOL 320 MG/ML IV SOLN
INTRAVENOUS | Status: DC | PRN
  Administered 2018-10-21: 11:00:00 40 mL via INTRA_ARTERIAL

## 2018-10-21 MED ORDER — SODIUM CHLORIDE 0.9 % IV SOLN: 250.0000 mL | INTRAVENOUS | Status: DC | PRN

## 2018-10-21 MED ORDER — DEXMEDETOMIDINE HCL IN NACL 200 MCG/50ML IV SOLN
INTRAVENOUS | Status: DC | PRN
  Administered 2018-10-21: 84.12 ug via INTRAVENOUS

## 2018-10-21 MED ORDER — TRAMADOL HCL 50 MG PO TABS: 50.0000 mg | ORAL_TABLET | ORAL | Status: DC | PRN

## 2018-10-21 MED ORDER — SODIUM CHLORIDE 0.9% FLUSH: 3.0000 mL | INTRAVENOUS | Status: DC | PRN

## 2018-10-21 MED ORDER — MORPHINE SULFATE (PF) 2 MG/ML IV SOLN: 1.0000 mg | INTRAVENOUS | Status: DC | PRN

## 2018-10-21 MED ORDER — PROPOFOL 500 MG/50ML IV EMUL
INTRAVENOUS | Status: DC | PRN
  Administered 2018-10-21: 10 ug/kg/min via INTRAVENOUS

## 2018-10-21 SURGICAL SUPPLY — 110 items
ADH SKN CLS APL DERMABOND .7 (GAUZE/BANDAGES/DRESSINGS) ×2
APL PRP STRL LF DISP 70% ISPRP (MISCELLANEOUS) ×2
BAG DECANTER FOR FLEXI CONT (MISCELLANEOUS) IMPLANT
BAG SNAP BAND KOVER 36X36 (MISCELLANEOUS) ×4 IMPLANT
BLADE CLIPPER SURG (BLADE) IMPLANT
BLADE OSCILLATING /SAGITTAL (BLADE) IMPLANT
BLADE STERNUM SYSTEM 6 (BLADE) IMPLANT
BLADE SURG 10 STRL SS (BLADE) IMPLANT
BNDG COHESIVE 3X5 TAN STRL LF (GAUZE/BANDAGES/DRESSINGS) ×2 IMPLANT
CABLE ADAPT CONN TEMP 6FT (ADAPTER) ×4 IMPLANT
CANISTER SUCT 3000ML PPV (MISCELLANEOUS) IMPLANT
CANNULA FEM VENOUS REMOTE 22FR (CANNULA) IMPLANT
CANNULA OPTISITE PERFUSION 16F (CANNULA) IMPLANT
CANNULA OPTISITE PERFUSION 18F (CANNULA) IMPLANT
CATH DIAG EXPO 6F AL1 (CATHETERS) IMPLANT
CATH DIAG EXPO 6F VENT PIG 145 (CATHETERS) ×8 IMPLANT
CATH EXTERNAL FEMALE PUREWICK (CATHETERS) IMPLANT
CATH INFINITI 6F AL2 (CATHETERS) IMPLANT
CATH S G BIP PACING (CATHETERS) ×4 IMPLANT
CHLORAPREP W/TINT 26 (MISCELLANEOUS) ×4 IMPLANT
CLIP VESOCCLUDE MED 24/CT (CLIP) IMPLANT
CLIP VESOCCLUDE SM WIDE 24/CT (CLIP) IMPLANT
CONT SPEC 4OZ CLIKSEAL STRL BL (MISCELLANEOUS) ×8 IMPLANT
COVER BACK TABLE 24X17X13 BIG (DRAPES) IMPLANT
COVER BACK TABLE 80X110 HD (DRAPES) ×8 IMPLANT
COVER DOME SNAP 22 D (MISCELLANEOUS) IMPLANT
COVER WAND RF STERILE (DRAPES) ×4 IMPLANT
DECANTER SPIKE VIAL GLASS SM (MISCELLANEOUS) ×4 IMPLANT
DERMABOND ADVANCED (GAUZE/BANDAGES/DRESSINGS) ×2
DERMABOND ADVANCED .7 DNX12 (GAUZE/BANDAGES/DRESSINGS) ×2 IMPLANT
DEVICE CLOSURE PERCLS PRGLD 6F (VASCULAR PRODUCTS) ×4 IMPLANT
DRAPE CV SPLIT W-CLR ANES SCRN (DRAPES) IMPLANT
DRAPE INCISE IOBAN 66X45 STRL (DRAPES) IMPLANT
DRAPE PERI GROIN 82X75IN TIB (DRAPES) IMPLANT
DRSG TEGADERM 4X4.75 (GAUZE/BANDAGES/DRESSINGS) ×6 IMPLANT
ELECT CAUTERY BLADE 6.4 (BLADE) IMPLANT
ELECT REM PT RETURN 9FT ADLT (ELECTROSURGICAL) ×8
ELECTRODE REM PT RTRN 9FT ADLT (ELECTROSURGICAL) ×4 IMPLANT
FELT TEFLON 6X6 (MISCELLANEOUS) ×4 IMPLANT
FEMORAL VENOUS CANN RAP (CANNULA) IMPLANT
GAUZE SPONGE 2X2 8PLY STRL LF (GAUZE/BANDAGES/DRESSINGS) IMPLANT
GAUZE SPONGE 4X4 12PLY STRL (GAUZE/BANDAGES/DRESSINGS) ×4 IMPLANT
GAUZE SPONGE 4X4 12PLY STRL LF (GAUZE/BANDAGES/DRESSINGS) ×2 IMPLANT
GLIDESHEATH SLEND SS 6F .021 (SHEATH) ×2 IMPLANT
GLOVE BIO SURGEON STRL SZ7.5 (GLOVE) IMPLANT
GLOVE BIO SURGEON STRL SZ8 (GLOVE) IMPLANT
GLOVE EUDERMIC 7 POWDERFREE (GLOVE) IMPLANT
GLOVE ORTHO TXT STRL SZ7.5 (GLOVE) IMPLANT
GOWN STRL REUS W/ TWL LRG LVL3 (GOWN DISPOSABLE) IMPLANT
GOWN STRL REUS W/ TWL XL LVL3 (GOWN DISPOSABLE) ×2 IMPLANT
GOWN STRL REUS W/TWL LRG LVL3 (GOWN DISPOSABLE)
GOWN STRL REUS W/TWL XL LVL3 (GOWN DISPOSABLE) ×4
GUIDEWIRE SAF TJ AMPL .035X180 (WIRE) ×4 IMPLANT
GUIDEWIRE SAFE TJ AMPLATZ EXST (WIRE) ×4 IMPLANT
INSERT FOGARTY SM (MISCELLANEOUS) IMPLANT
KIT BASIN OR (CUSTOM PROCEDURE TRAY) ×4 IMPLANT
KIT DILATOR VASC 18G NDL (KITS) IMPLANT
KIT HEART LEFT (KITS) ×4 IMPLANT
KIT SUCTION CATH 14FR (SUCTIONS) IMPLANT
KIT TURNOVER KIT B (KITS) ×4 IMPLANT
LOOP VESSEL MAXI BLUE (MISCELLANEOUS) IMPLANT
LOOP VESSEL MINI RED (MISCELLANEOUS) IMPLANT
NDL PERC 18GX7CM (NEEDLE) ×2 IMPLANT
NEEDLE 22X1 1/2 (OR ONLY) (NEEDLE) IMPLANT
NEEDLE PERC 18GX7CM (NEEDLE) ×4 IMPLANT
NS IRRIG 1000ML POUR BTL (IV SOLUTION) ×12 IMPLANT
PACK ENDO MINOR (CUSTOM PROCEDURE TRAY) ×4 IMPLANT
PACK ENDOVASCULAR (PACKS) ×4 IMPLANT
PAD ARMBOARD 7.5X6 YLW CONV (MISCELLANEOUS) ×8 IMPLANT
PAD ELECT DEFIB RADIOL ZOLL (MISCELLANEOUS) ×4 IMPLANT
PENCIL BUTTON HOLSTER BLD 10FT (ELECTRODE) ×4 IMPLANT
PERCLOSE PROGLIDE 6F (VASCULAR PRODUCTS) ×8
POSITIONER HEAD DONUT 9IN (MISCELLANEOUS) ×4 IMPLANT
SET MICROPUNCTURE 5F STIFF (MISCELLANEOUS) ×4 IMPLANT
SHEATH BRITE TIP 6FR 35CM (SHEATH) ×4 IMPLANT
SHEATH BRITE TIP 7FR 35CM (SHEATH) ×4 IMPLANT
SHEATH PINNACLE 6F 10CM (SHEATH) ×4 IMPLANT
SHEATH PINNACLE 8F 10CM (SHEATH) ×4 IMPLANT
SHIELD RADPAD SCOOP 12X17 (MISCELLANEOUS) ×2 IMPLANT
SLEEVE REPOSITIONING LENGTH 30 (MISCELLANEOUS) ×4 IMPLANT
SPONGE GAUZE 2X2 STER 10/PKG (GAUZE/BANDAGES/DRESSINGS) ×2
SPONGE LAP 4X18 RFD (DISPOSABLE) IMPLANT
STOPCOCK MORSE 400PSI 3WAY (MISCELLANEOUS) ×10 IMPLANT
SUT ETHIBOND X763 2 0 SH 1 (SUTURE) IMPLANT
SUT GORETEX CV 4 TH 22 36 (SUTURE) IMPLANT
SUT GORETEX CV4 TH-18 (SUTURE) IMPLANT
SUT MNCRL AB 3-0 PS2 18 (SUTURE) IMPLANT
SUT PROLENE 5 0 C 1 36 (SUTURE) IMPLANT
SUT PROLENE 6 0 C 1 30 (SUTURE) IMPLANT
SUT SILK  1 MH (SUTURE) ×2
SUT SILK 1 MH (SUTURE) ×2 IMPLANT
SUT VIC AB 2-0 CT1 27 (SUTURE)
SUT VIC AB 2-0 CT1 TAPERPNT 27 (SUTURE) IMPLANT
SUT VIC AB 2-0 CTX 36 (SUTURE) IMPLANT
SUT VIC AB 3-0 SH 8-18 (SUTURE) IMPLANT
SYR 50ML LL SCALE MARK (SYRINGE) ×4 IMPLANT
SYR BULB IRRIGATION 50ML (SYRINGE) IMPLANT
SYR CONTROL 10ML LL (SYRINGE) IMPLANT
SYR MEDRAD MARK V 150ML (SYRINGE) ×4 IMPLANT
TAPE CLOTH SURG 4X10 WHT LF (GAUZE/BANDAGES/DRESSINGS) ×2 IMPLANT
TOWEL GREEN STERILE (TOWEL DISPOSABLE) ×8 IMPLANT
TOWEL GREEN STERILE FF (TOWEL DISPOSABLE) ×4 IMPLANT
TRANSDUCER W/STOPCOCK (MISCELLANEOUS) ×8 IMPLANT
TRAY FOLEY SLVR 14FR TEMP STAT (SET/KITS/TRAYS/PACK) IMPLANT
TRAY FOLEY SLVR 16FR TEMP STAT (SET/KITS/TRAYS/PACK) IMPLANT
TUBE SUCT INTRACARD DLP 20F (MISCELLANEOUS) IMPLANT
TUBING INJECTOR 48 (MISCELLANEOUS) ×2 IMPLANT
VALVE 26 ULTRA SAPIEN KIT (Valve) ×2 IMPLANT
WIRE EMERALD 3MM-J .035X150CM (WIRE) ×4 IMPLANT
WIRE EMERALD 3MM-J .035X260CM (WIRE) ×4 IMPLANT

## 2018-10-21 NOTE — Progress Notes (Signed)
  Echocardiogram 2D Echocardiogram limited during TAVR has been performed.  Darlina Sicilian M 10/21/2018, 1:29 PM

## 2018-10-21 NOTE — Anesthesia Postprocedure Evaluation (Signed)
Anesthesia Post Note  Patient: Jose Garrett  Procedure(s) Performed: TRANSCATHETER AORTIC VALVE REPLACEMENT, TRANSFEMORAL (N/A Chest) Intraoperative Transthoracic Echocardiogram (N/A )     Patient location during evaluation: PACU Anesthesia Type: MAC Level of consciousness: awake and alert Pain management: pain level controlled Vital Signs Assessment: post-procedure vital signs reviewed and stable Respiratory status: spontaneous breathing Cardiovascular status: stable Anesthetic complications: no    Last Vitals:  Vitals:   10/21/18 1520 10/21/18 1555  BP:  114/73  Pulse:  88  Resp:  (!) 23  Temp: (!) 36.4 C 37 C  SpO2:      Last Pain:  Vitals:   10/21/18 1555  TempSrc: Oral  PainSc: 0-No pain                 Nolon Nations

## 2018-10-21 NOTE — Progress Notes (Signed)
Pt received from  PACU. Vital stable. CCMD notified, telebox 15 applied. Pt neuro intact. CHG bath given after regular bath. Will continue to monitor. Jerald Kief, RN

## 2018-10-21 NOTE — Evaluation (Addendum)
Occupational Therapy Evaluation Patient Details Name: Jose Garrett MRN: [id number] DOB: Sep 29, 1950 Today's Date: 10/21/2018    History of Present Illness 68 yo admitted with dyspnea awaiting TAVR. PMHx: aortic stenosis, HTN, HLD, GERD, CHF, CAD, PAD, anxiety, ESRD on HD   Clinical Impression   PTA patient reports independent with ADLs, mobility using cane, and driving. Admitted for above and limited by problem list below, including impaired balance, decreased activity tolerance and generalized weakness.  Patient currently requires min guard to supervision for mobility using cane (with several LOB and min guard to correct), supervision for transfer and min guard for LB ADLs.  Discussed energy conservation and safety during ADLs.  Patient with planned TVAR today.  Will follow acutely to maximize independence and safety with ADLS, mobility prior to dc home; anticipate no further needs after dc home.     Follow Up Recommendations  No OT follow up;Supervision/Assistance - 24 hour    Equipment Recommendations  None recommended by OT    Recommendations for Other Services       Precautions / Restrictions Precautions Precautions: Fall Precaution Comments: watch sats Restrictions Weight Bearing Restrictions: No      Mobility Bed Mobility Overal bed mobility: Modified Independent                Transfers Overall transfer level: Needs assistance   Transfers: Sit to/from Stand Sit to Stand: Supervision         General transfer comment: supervision for safety and balance    Balance Overall balance assessment: Needs assistance Sitting-balance support: No upper extremity supported;Feet supported Sitting balance-Leahy Scale: Good     Standing balance support: Single extremity supported;During functional activity Standing balance-Leahy Scale: Fair Standing balance comment: patient reliant on at least 1 UE support, noted several LOB with min guard required to correct                            ADL either performed or assessed with clinical judgement   ADL Overall ADL's : Needs assistance/impaired     Grooming: Supervision/safety;Standing   Upper Body Bathing: Set up;Sitting   Lower Body Bathing: Min guard;Sit to/from stand   Upper Body Dressing : Supervision/safety;Sitting   Lower Body Dressing: Min guard;Sit to/from stand   Toilet Transfer: Min guard;Supervision/safety;Ambulation Toilet Transfer Details (indicate cue type and reason): using cane, min guard to supervision d/t balance and safety         Functional mobility during ADLs: Min guard;Supervision/safety;Cane General ADL Comments: pt limited by decreased activity tolerance, impaired balance     Vision         Perception     Praxis      Pertinent Vitals/Pain Pain Assessment: 0-10 Pain Score: 6  Pain Location: B feet/ankles Pain Descriptors / Indicators: Discomfort;Other (Comment);Burning(neuropathy) Pain Intervention(s): Monitored during session     Hand Dominance Right   Extremity/Trunk Assessment Upper Extremity Assessment Upper Extremity Assessment: Overall WFL for tasks assessed   Lower Extremity Assessment Lower Extremity Assessment: Defer to PT evaluation   Cervical / Trunk Assessment Cervical / Trunk Assessment: Normal   Communication Communication Communication: No difficulties   Cognition Arousal/Alertness: Awake/alert Behavior During Therapy: WFL for tasks assessed/performed Overall Cognitive Status: Within Functional Limits for tasks assessed  General Comments  VSS- HR 87-90 SpO2 on RA 95%    Exercises     Shoulder Instructions      Home Living Family/patient expects to be discharged to:: Private residence Living Arrangements: Children Available Help at Discharge: Family;Available PRN/intermittently Type of Home: House Home Access: Stairs to enter [employer] of Steps:  1   Home Layout: One level     Bathroom Shower/Tub: Occupational psychologist: Standard     Home Equipment: Cane - single point;Shower seat   Additional Comments: Plans to go stay with middle daughter- her house is described above      Prior Functioning/Environment Level of Independence: Independent with assistive device(s)        Comments: uses a cane for mobility, independent ADLs/IADLs, +driving         OT Problem List: Decreased strength;Decreased activity tolerance;Impaired balance (sitting and/or standing);Decreased knowledge of precautions;Cardiopulmonary status limiting activity      OT Treatment/Interventions: Self-care/ADL training;DME and/or AE instruction;Energy conservation;Patient/family education;Balance training;Therapeutic activities    OT Goals(Current goals can be found in the care plan section) Acute Rehab OT Goals Patient Stated Goal: to get stronger  OT Goal Formulation: With patient Time For Goal Achievement: 11/04/18 Potential to Achieve Goals: Good  OT Frequency: Min 2X/week   Barriers to D/C:            Co-evaluation              AM-PAC OT "6 Clicks" Daily Activity     Outcome Measure Help from another person eating meals?: None Help from another person taking care of personal grooming?: A Little Help from another person toileting, which includes using toliet, bedpan, or urinal?: A Little Help from another person bathing (including washing, rinsing, drying)?: A Little Help from another person to put on and taking off regular upper body clothing?: None Help from another person to put on and taking off regular lower body clothing?: A Little 6 Click Score: 20   End of Session Equipment Utilized During Treatment: Other (comment)(cane)  Activity Tolerance: Patient tolerated treatment well Patient left: in bed;with call bell/phone within reach  OT Visit Diagnosis: Unsteadiness on feet (R26.81);Other (comment)(decreased  activity tolerance)                Time: 0932-3557 OT Time Calculation (min): 14 min Charges:  OT General Charges $OT Visit: 1 Visit OT Evaluation $OT Eval Moderate Complexity: [place], OT Acute Rehabilitation Services Pager [phone] Office [phone]    Delight Stare 10/21/2018, 9:07 AM

## 2018-10-21 NOTE — Consult Note (Signed)
[facility]   MULTIDISCIPLINARY HEART VALVE CLINIC   HISTORY AND PHYSICAL EXAM  68 year old male with a complicated medical history and multiple comorbid conditions.  He is currently hospitalized with acute on chronic combined systolic and diastolic heart failure in the setting of ischemic heart disease and severe low-flow low gradient aortic stenosis.  The patient is noted to have chronic occlusion of the right coronary artery with left-to-right collaterals.  He has end-stage renal disease and has been dialysis dependent now for greater than 3 years.  Other problems include chronic anemia, peripheral arterial disease status post left femoropopliteal bypass, left groin seroma, and degenerative lumbar disc disease.  The patient has been noted to have a functionally bicuspid aortic valve with documentation of moderate aortic stenosis on past echo studies.  He is undergone formal multidisciplinary heart team evaluation for consideration of TAVR.  However, the patient's care was delayed because of the COVID-19 pandemic.  In addition, he developed critical limb ischemia and required left common femoral to below-knee popliteal artery bypass.  The patient's most recent echocardiogram demonstrated an LVEF of 35 to 40% with a peak transaortic velocity of 3.7 m/s and mean transvalvular gradient of 30 mmHg.  He continues to have shortness of breath with exertion and at rest.  He occasionally gets chest tightness with physical activity.  He is currently admitted with progressive shortness of breath felt to be secondary to fluid overload with acute on chronic heart failure.   During the course of the patient's preoperative work up they have been evaluated comprehensively by a multidisciplinary team of specialists coordinated through the [facility] in the [facility].  The patient has been counseled extensively as to the relative risks and benefits of all  options for the treatment of severe aortic stenosis including long term medical therapy, conventional surgery for aortic valve replacement, and transcatheter aortic valve replacement.  The patient has been independently evaluated by Dr. Roxy Manns in formal cardiac surgical consultation.  He is felt to be an appropriate candidate for TAVR and would be at very high risk of conventional surgery in the setting of his comorbid conditions and poor functional capacity outlined above.  Based upon review of all of the patient's preoperative diagnostic tests they are felt to be candidate for transcatheter aortic valve replacement using the right transfemoral approach as an alternative to high risk conventional surgery.  The patient has been counseled at length regarding the indications, risks and potential benefits of surgery.  All questions have been answered, and the patient now presents for surgery having provided full informed consent for the operation as planned.   Past Medical History:  Diagnosis Date   Acute myocardial infarction involving right coronary artery (HCC)    Amyloidosis ([place])    Anemia    Anxiety    Aortic stenosis    severe   CAD (coronary artery disease)    a. cardiac cath on 11/25/17 with occlusion of the RCA which could have been his event but the RCA could not be engaged. The RCA filled distally from left to right collaterals. There were no severe stenoses in the left coronary system. Medical therapy recommended, started on plavix.   Chronic combined systolic and diastolic CHF (congestive heart failure) ([place])    a. EF dropped to 35-40% with grade 2 DD by echo 11/2017.   DJD (degenerative joint disease), lumbar    Dyspnea    ESRD (end stage renal disease) ([place])  ESRD- HEMO MWF- [place]   GERD (gastroesophageal reflux disease)    H/O seasonal allergies    Hyperlipidemia    Hypertension    Muscle spasms of lower extremity     RLE   " it is because of my back"    Neuropathy    Peripheral vascular disease (HCC)    Pneumonia    PONV (postoperative nausea and vomiting)    Restless legs    Wears glasses     Past Surgical History:  Procedure Laterality Date   ABDOMINAL AORTOGRAM W/LOWER EXTREMITY Bilateral 07/16/2018   Procedure: ABDOMINAL AORTOGRAM W/LOWER EXTREMITY;  Surgeon: Wellington Hampshire, MD;  Location: [facility];  Service: Cardiovascular;  Laterality: Bilateral;   AMPUTATION Left 08/19/2018   Procedure: AMPUTATION LEFT TOES SECOND AND THIRD;  Surgeon: Waynetta Sandy, MD;  Location: [facility];  Service: Vascular;  Laterality: Left;   AV FISTULA PLACEMENT Right 12/27/2015   Procedure: Right Arm ARTERIOVENOUS (AV) FISTULA CREATION;  Surgeon: Angelia Mould, MD;  Location: [place] [facility];  Service: Vascular;  Laterality: Right;   CATARACT EXTRACTION W/ INTRAOCULAR LENS  IMPLANT, BILATERAL     COLONOSCOPY     ERCP W/ SPHICTEROTOMY     FEMORAL-POPLITEAL BYPASS GRAFT Left 07/22/2018   Procedure: left FEMORAL TO BELOW KNEE POPLITEAL ARTERY BYPASS GREAFT using 58mm removable ring propaten gore graft;  Surgeon: Waynetta Sandy, MD;  Location: [facility];  Service: Vascular;  Laterality: Left;   IR GENERIC HISTORICAL  01/27/2016   IR FLUORO GUIDE CV LINE RIGHT 01/27/2016 Arne Cleveland, MD [facility]   IR GENERIC HISTORICAL  01/27/2016   IR US GUIDE VASC ACCESS RIGHT 01/27/2016 Arne Cleveland, MD [facility]   LAPAROSCOPIC CHOLECYSTECTOMY  03/2011   LAPAROSCOPIC CHOLECYSTECTOMY     LEFT HEART CATH AND CORONARY ANGIOGRAPHY N/A 11/25/2017   Procedure: LEFT HEART CATH AND CORONARY ANGIOGRAPHY;  Surgeon: Martinique, Peter M, MD;  Location: [facility];  Service: Cardiovascular;  Laterality: N/A;   LEFT HEART CATH AND CORONARY ANGIOGRAPHY N/A 07/24/2018   Procedure: LEFT HEART CATH AND CORONARY ANGIOGRAPHY;  Surgeon: Sherren Mocha, MD;  Location: [facility];  Service: Cardiovascular;  Laterality: N/A;   RENAL  BIOPSY, PERCUTANEOUS Right 09/08/2014   RIGHT/LEFT HEART CATH AND CORONARY ANGIOGRAPHY N/A 03/25/2018   Procedure: RIGHT/LEFT HEART CATH AND CORONARY ANGIOGRAPHY;  Surgeon: Martinique, Peter M, MD;  Location: [facility];  Service: Cardiovascular;  Laterality: N/A;   TOE AMPUTATION Left 08/19/2018   2nd & 3rd toes   TONSILLECTOMY  ~ 1960   ULTRASOUND GUIDANCE FOR VASCULAR ACCESS  03/25/2018   Procedure: Ultrasound Guidance For Vascular Access;  Surgeon: Martinique, Peter M, MD;  Location: [facility];  Service: Cardiovascular;;    Family History  Problem Relation Age of Onset   Hypertension Mother    Diabetes Mother    Heart disease Mother    Skin cancer Mother    Hypertension Father    Diabetes Father    Diabetes Sister     Social History Social History   Tobacco Use   Smoking status: Former Smoker    Packs/day: 2.00    Years: 40.00    Pack years: 80.00    Types: Cigarettes    Quit date: 03/19/2011    Years since quitting: 7.5   Smokeless tobacco: Never Used  Substance Use Topics   Alcohol use: No    Alcohol/week: 0.0 standard drinks   Drug use: No  Comment:  "I have used marijuana till the 1990's". Notes that he quit 15 yrs ago    Prior to Admission medications   Medication Sig Start Date End Date Taking? Authorizing Provider  acetaminophen (TYLENOL) 500 MG tablet Take 500-1,000 mg by mouth daily as needed for moderate pain.    Yes [provider]  aspirin EC 81 MG tablet Take 1 tablet (81 mg total) by mouth daily. Patient taking differently: Take 81 mg by mouth every evening.  11/30/17  Yes Cheryln Manly, NP  atorvastatin (LIPITOR) 80 MG tablet Take 1 tablet (80 mg total) by mouth daily with supper. 08/12/18  Yes Angiulli, Lavon Paganini, PA-C  cetirizine (ZYRTEC) 10 MG tablet Take 10 mg by mouth daily.   Yes [provider]  clopidogrel (PLAVIX) 75 MG tablet Take 1 tablet (75 mg total) by mouth daily with supper. 08/12/18  Yes Angiulli,  Lavon Paganini, PA-C  gabapentin (NEURONTIN) 300 MG capsule Take 1 capsule (300 mg total) by mouth every morning. Patient taking differently: Take 300-600 mg by mouth See admin instructions. Take one capsule (300 mg) by mouth every morning and two capsules (600 mg) at night, may also take two capsules (600 mg) midday as needed for pain 08/13/18  Yes Angiulli, Lavon Paganini, PA-C  LORazepam (ATIVAN) 1 MG tablet Take 1 mg by mouth 2 (two) times daily as needed for anxiety.   Yes [provider]  midodrine (PROAMATINE) 10 MG tablet Take 1 tablet (10 mg total) by mouth every Monday, Wednesday, and Friday with hemodialysis. Patient taking differently: Take 10 mg by mouth See admin instructions. Take one tablet (10 mg) by mouth every Monday, Wednesday, Friday before dialysis 08/13/18  Yes Angiulli, Lavon Paganini, PA-C  multivitamin (RENA-VIT) TABS tablet Take 1 tablet by mouth at bedtime. 08/12/18  Yes Angiulli, Lavon Paganini, PA-C  mupirocin ointment (BACTROBAN) 2 % Apply 1 application topically daily as needed (irritation).    Yes [provider]  oxyCODONE (OXY IR/ROXICODONE) 5 MG immediate release tablet Take 1-2 tablets (5-10 mg total) by mouth every 4 (four) hours as needed for moderate pain. 08/20/18  Yes Laurence Slate M, PA-C  pantoprazole (PROTONIX) 40 MG tablet Take 1 tablet (40 mg total) by mouth daily. Patient taking differently: Take 40 mg by mouth every evening.  10/03/18  Yes Fulp, Cammie, MD  sucroferric oxyhydroxide (VELPHORO) 500 MG chewable tablet Chew 2 tablets (1,000 mg total) by mouth 3 (three) times daily with meals. Patient taking differently: Chew 500-1,000 mg by mouth See admin instructions. Take 2 tablets (1000 mg) by mouth up to three times daily with meals, take 1 tablet (500 mg) with snacks 08/12/18  Yes Angiulli, Lavon Paganini, PA-C  vitamin B-12 (CYANOCOBALAMIN) 1000 MCG tablet Take 1,000 mcg by mouth every evening.    Yes [provider]  lidocaine (LIDODERM) 5 % Place 1 patch onto  the skin daily. Remove & Discard patch within 12 hours or as directed by MD 10/09/18   Jamse Arn, MD  metoprolol tartrate (LOPRESSOR) 50 MG tablet Take 1 tablet (50 mg total) by mouth as directed. Patient not taking: Reported on 10/17/2018 09/22/18 09/22/19  Eileen Stanford, PA-C    No Known Allergies   Review of Systems: Negative except as outlined above  Physical Exam: Pt is alert and oriented, pleasant elderly male, in no distress. HEENT: normal Neck: JVP normal. Carotid upstrokes normal with bilateral bruits. No thyromegaly. Lungs: equal expansion, clear bilaterally CV: Apex is discrete and nondisplaced,  RRR with 3/6 harsh late peaking systolic murmur best appreciated at the apex  abd: soft, NT, +BS, no bruit, no hepatosplenomegaly Back: no CVA tenderness Ext: 1+ pedal edema bilaterally        Several toe amputations noted Skin: warm and dry without rash Neuro: CNII-XII intact             Strength intact = bilaterally  Diagnostic Tests: 2D Echo: IMPRESSIONS    1. The left ventricle has moderately reduced systolic function, with an ejection fraction of 35-40%. The cavity size was normal. There is moderately increased left ventricular wall thickness. Left ventricular diastolic Doppler parameters are consistent  with impaired relaxation. Elevated left atrial and left ventricular end-diastolic pressures The E/e' is >15.  2. Moderate akinesis of the left ventricular, entire inferior wall.  3. The right ventricle has normal systolic function. The cavity was normal. There is no increase in right ventricular wall thickness.  4. The mitral valve is abnormal. Mild thickening of the mitral valve leaflet. There is mild mitral annular calcification present.  5. The tricuspid valve is grossly normal.  6. The aortic valve is tricuspid. Moderate calcification of the aortic valve. Aortic valve regurgitation is mild by color flow Doppler. Moderate-severe stenosis of the aortic valve.   7. The inferior vena cava was dilated in size with <50% respiratory variability.  8. The interatrial septum was not assessed.  9. When compared to the prior study: 03/24/2018: LVEF 40-45%, moderate aortic stenosis - mean gradient 30 mmHg.  Cardiac cath: Conclusion  1. Severe single vessel CAD with chronic total occlusion of the RCA, collateralized with L--->R collaterals 2. Mild calcific nonobstructive LCA disease, anatomy unchanged from previous cath studies 3. At least moderate AS, possibly severe LFLG - will check echo  Recommend: medical therapy, no target for PCI   Diagnostic Dominance: Right Left Main  Vessel is normal in caliber. Vessel is angiographically normal.  Left Anterior Descending  The proximal vessel has an eccentric 40% stenosis unchanged from past cath studies. The vessel is diffusely calcified.  Prox LAD lesion 40% stenosed  Prox LAD lesion is 40% stenosed. The lesion is moderately calcified.  Ramus Intermedius  Large branch with no high-grade stenosis  Ramus lesion 40% stenosed  Ramus lesion is 40% stenosed.  Left Circumflex  Ost Cx to Prox Cx lesion 65% stenosed  Ost Cx to Prox Cx lesion is 65% stenosed.  Right Coronary Artery  Ost RCA to Mid RCA lesion 100% stenosed  Ost RCA to Mid RCA lesion is 100% stenosed.  Right Posterior Atrioventricular Artery  Collaterals  RPAV filled by collaterals from 2nd Sept.    Intervention  No interventions have been documented. Wall Motion  Resting               Left Heart  Left Ventricle There is severe left ventricular systolic dysfunction. LV end diastolic pressure is mildly elevated. There are LV function abnormalities due to segmental dysfunction. LVEF estimated 35%  Coronary Diagrams  Diagnostic Dominance: Right  Intervention  Implants   No implant documentation for this case.  Syngo Images  Show images for CARDIAC CATHETERIZATION  Images on Long Term Storage  Show images for [place], [place] to Procedure Log  Procedure Log    Hemo Data   Most Recent Value  Aortic Mean Gradient 27.67 mmHg  Aortic Peak Gradient 35 mmHg  AO Systolic Pressure 68 mmHg  AO Diastolic Pressure 41 mmHg  AO Mean 52 mmHg  LV Systolic Pressure 462 mmHg  LV Diastolic Pressure 9 mmHg  LV EDP 17 mmHg  AOp Systolic Pressure 75 mmHg  AOp Diastolic Pressure 42 mmHg  AOp Mean Pressure 54 mmHg  LVp Systolic Pressure 703 mmHg  LVp Diastolic Pressure 10 mmHg  LVp EDP Pressure 23 mmHg     Gated cardiac CTA: FINDINGS: Aortic Valve: Trileaflet aortic valve with severely thickened and calcified leaflets with severely restricted leaflet opening and no calcifications extending into the LVOT.  Aorta: Normal size, mild diffuse atherosclerotic plaque and calcifications and no dissection.  Sinotubular Junction: 31 x 29 mm  Ascending Thoracic Aorta: 31 x 30 mm  Aortic Arch: 28 x 26 mm  Descending Thoracic Aorta: 23 x 22 mm  Sinus of Valsalva Measurements:  Non-coronary: 34 mm  Right -coronary: 33 mm  Left -coronary: 35 mm  Coronary Artery Height above Annulus:  Left Main: 12 mm  Right Coronary: 19 mm (RCA occluded)  Virtual Basal Annulus Measurements:  Maximum/Minimum Diameter: 28.6 x 24.1 mm  Mean Diameter: 25.4 mm  Perimeter: 81.5 mm  Area: 508 mm2  Optimum Fluoroscopic Angle for Delivery: LAO 6 CAU 7.  IMPRESSION: 1. Trileaflet aortic valve with severely thickened and calcified leaflets with severely restricted leaflet opening and no calcifications extending into the LVOT. Aortic valve calcium score 2859 consistent with severe aortic stenosis. Annular measurements suitable for delivery of a 26 mm Edwards-SAPIEN 3 valve.  2. Sufficient coronary to annulus distance.  3. Optimum Fluoroscopic Angle for Delivery:  LAO 6 CAU 7.  4. No thrombus in the left atrial appendage.  CTA Chest/Abdomen/Pelvis: AORTA:  Minimal Aortic Diameter-11 x 14  mm  Severity of Aortic Calcification-severe  RIGHT PELVIS:  Right Common Iliac Artery -  Minimal Diameter-6.6 x 5.2 mm  Tortuosity - mild  Calcification-severe  Right External Iliac Artery -  Minimal Diameter-5.4 x 5.9 mm  Tortuosity - mild  Calcification-moderate  Right Common Femoral Artery -  Minimal Diameter-6.1 x 5.3 mm  Tortuosity - mild  Calcification-moderate  LEFT PELVIS:  Left Common Iliac Artery -  Minimal Diameter-7.0 x 7.0 mm  Tortuosity - mild  Calcification-severe  Left External Iliac Artery -  Minimal Diameter-7.5 x 6.7 mm  Tortuosity - mild  Calcification-mild  Left Common Femoral Artery -  Minimal Diameter-8.3 x 6.6 mm  Tortuosity-mild  Calcification-mild-to-moderate  Review of the MIP images confirms the above findings.  IMPRESSION: 1. Vascular findings and measurements pertinent to potential TAVR procedure, as detailed above. 2. Large low-intermediate attenuation fluid collection in the left inguinal region likely to represent a resolving hematoma. There is also a small pseudoaneurysm extending off the medial aspect of the distal left common femoral artery, as above. 3. Severe thickening calcification of the aortic valve, compatible with the reported clinical history of severe aortic stenosis. 4. Cardiomegaly with moderate left ventricular dilatation. 5. Aortic atherosclerosis, in addition to left main and 3 vessel coronary artery disease. Please note that although the presence of coronary artery calcium documents the presence of coronary artery disease, the severity of this disease and any potential stenosis cannot be assessed on this non-gated CT examination. Assessment for potential risk factor modification, dietary therapy or pharmacologic therapy may be warranted, if clinically indicated. 6. Moderate bilateral renal atrophy. 7. Colonic diverticulosis without evidence of acute  diverticulitis at this time. 8. Additional incidental findings, as above.  Impression: 68 YEAR-OLD MAN WITH SEVERE SYMPTOMATIC AORTIC STENOSIS (stage D2 disease). Comorbid medical conditions place patient at high/prohibitive risk of SAVR (Surgical  Aortic Valve Replacement). Therefore, TAVR is a reasonable treatment option in this patient.   Plan:  We plan to proceed with transcatheter aortic valve replacement using the right transfemoral approach as an alternative to high risk conventional aortic valve replacement.  The patient understands and accepts all potential associated risks of surgery including but not limited to risks of death, stroke, paravalvular leak, aortic dissection, other major vascular complications, aortic annulus rupture, device embolization, cardiac rupture or perforation, mitral regurgitation, acute myocardial infarction, arrhythmia, heart block or bradycardia requiring permanent pacemaker placement, congestive heart failure, respiratory failure, renal failure, pneumonia, infection, pleural effusion, pericardial effusion or tamponade, pulmonary embolus or other thromboembolic complications, late complications related to structural valve deterioration or migration, or other complications that might ultimately cause a temporary or permanent loss of functional independence or other long term morbidity.  Following the decision to proceed with transcatheter aortic valve replacement, a discussion has also been held regarding what types of management strategies would be attempted intraoperatively in the event of life-threatening complications, including whether or not the patient would be considered a candidate for the use of cardiopulmonary bypass and/or conversion to open sternotomy for attempted surgical intervention.  The patient provides full informed consent for the procedure as planned and all questions have been answered.   Blane Ohara, M.D. 10/21/2018 11:25 AM

## 2018-10-21 NOTE — Progress Notes (Signed)
Paged provider on-call ([place]) regarding pt's persistent but asymptomatic Ventricular Bigeminy since shift-change. Page promptly returned and order received to obtain 12 Lead EKG to document rhythm in the event it doesn't persist. Pt updated on plan of care and amenable to proceed.

## 2018-10-21 NOTE — Anesthesia Procedure Notes (Signed)
Procedure Name: MAC Date/Time: 10/21/2018 11:30 AM Performed by: Candis Shine, CRNA Pre-anesthesia Checklist: Patient identified, Emergency Drugs available, Suction available, Patient being monitored and Timeout performed Patient Re-evaluated:Patient Re-evaluated prior to induction Dental Injury: Teeth and Oropharynx as per pre-operative assessment

## 2018-10-21 NOTE — Progress Notes (Addendum)
PROGRESS NOTE  Jose Garrett WLS:[id number] DOB: December 24, 1950 DOA: 10/17/2018 PCP: [name], MD  Brief History   Jose Garrett is a 68 y.o. male with medical history significant for chronic combined systolic and diastolic CHF (EF 81-15%), ESRD on MWF HD, CAD, severe aortic stenosis, PAD status post left femoropopliteal bypass, hypertension, hyperlipidemia, thrombocytopenia, and anxiety who presents the ED for evaluation of dyspnea.  Patient states he has baseline shortness of breath however had sudden worsening of dyspnea morning of 10/17/2018.  He states he becomes very short of breath with minimal exertion and has had paroxysmal nocturnal dyspnea.  He has noticed becoming generally weak especially at the end of dialysis sessions and reports that his blood pressure becomes low at the end of the sessions as well.  He has had a cough productive of occasional clear sputum.  He reports generalized weakness and some lightheadedness with activity and states that his balance has been off.  He denies any falls or loss of consciousness.  He denies any chest pain, palpitations, subjective fevers, diaphoresis, chills, abdominal pain.  He no longer makes urine.  He states he is having occasional loose but not watery stools.  Initial vitals showed BP 125/52, pulse 92, RR 20, temp 97.7 Fahrenheit, SPO2 96% on room air.  Labs are notable for WBC 4.2, hemoglobin 10.5, platelets 69,000, sodium 140, potassium 3.6, bicarb 26, BUN 40, creatinine 8.37, high-sensitivity troponin 85 > 77.  SARS-CoV-2 test was negative.  Portable chest x-ray showed enlarged cardiac silhouette with pulmonary venous congestion.  Patient was given aspirin 324 mg in the ED.  Cardiology were consulted and recommended admission for further management prior to potential TAVR procedure.  Nephrology were consulted and will see tomorrow for planned HD.  The hospitalist service was consulted to admit.  Jose Garrett is a 68 y.o. male with  medical history significant for chronic combined systolic and diastolic CHF (EF 72-62%), ESRD on MWF HD, CAD, severe aortic stenosis, PAD status post left femoropopliteal bypass, hypertension, hyperlipidemia, thrombocytopenia, and anxiety who is admitted with progressive dyspnea.  The patient underwent TAVR today. He has tolerated the procedure well.  Consultants  . Nephrology . Cardiology  Procedures  . Dialysis  Antibiotics   Anti-infectives (From admission, onward)   Start     Dose/Rate Route Frequency Ordered Stop   10/21/18 2200  cefUROXime (ZINACEF) 1.5 g in sodium chloride 0.9 % 100 mL IVPB  Status:  Discontinued     1.5 g 200 mL/hr over 30 Minutes Intravenous Every 12 hours 10/21/18 1554 10/21/18 1620   10/21/18 2200  cefUROXime (ZINACEF) 1.5 g in sodium chloride 0.9 % 100 mL IVPB     1.5 g 200 mL/hr over 30 Minutes Intravenous Every 24 hours 10/21/18 1620 10/23/18 2159   10/21/18 2130  vancomycin (VANCOCIN) IVPB 1000 mg/200 mL premix     1,000 mg 200 mL/hr over 60 Minutes Intravenous  Once 10/21/18 1554     10/21/18 0400  vancomycin (VANCOCIN) 1,500 mg in sodium chloride 0.9 % 250 mL IVPB     1,500 mg 125 mL/hr over 120 Minutes Intravenous To Surgery 10/20/18 1055 10/21/18 1340   10/21/18 0400  cefUROXime (ZINACEF) 1.5 g in sodium chloride 0.9 % 100 mL IVPB     1.5 g 200 mL/hr over 30 Minutes Intravenous To Surgery 10/20/18 1055 10/21/18 1210     Subjective  The patient is resting comfortably. He is sleeping.  Objective   Vitals:  Vitals:   10/21/18  1520 10/21/18 1555  BP:  114/73  Pulse:  88  Resp:  (!) 23  Temp: (!) 97.5 F (36.4 C) 98.6 F (37 C)  SpO2:      Exam:  Constitutional:  . The patient is sleeping soundly. No acute distress. Respiratory:  . No increased work of breathing. . No wheezes or rhonchi. . Mild rales present. . No tactile fremitus. Cardiovascular:  . Regular rate and rhythm. . No murmurs, ectopy, or gallups . No lateral PMI.  No thrills. Abdomen:  . Abdomen is soft, non-tender, non-distended. . No hernias, masses, or organomegaly are appreciated. . Normoactive bowel sounds. Musculoskeletal:  . No cyanosis or clubbing . Positive for 2+ edema Skin:  . No rashes, lesions, ulcers . palpation of skin: no induration or nodules Neurologic:  . CN 2-12 intact . Sensation all 4 extremities intact Psychiatric:  . Mental status o Mood, affect appropriate o Orientation to person, place, time  . judgment and insight appear intact    I have personally reviewed the following:   Today's Data  . Vitals, BMP, CBC  Scheduled Meds: . Chlorhexidine Gluconate Cloth  6 each Topical Q0600  . sodium chloride flush  3 mL Intravenous Q12H   Continuous Infusions: . sodium chloride 50 mL/hr at 10/21/18 1358  . sodium chloride    . cefUROXime (ZINACEF)  IV    . nitroGLYCERIN    . phenylephrine (NEO-SYNEPHRINE) Adult infusion    . vancomycin      Principal Problem:   S/P TAVR (transcatheter aortic valve replacement) Active Problems:   Essential hypertension, benign   AL amyloidosis (HCC)   Anemia of chronic disease   ESRD (end stage renal disease) on dialysis (HCC)   Acute on chronic combined systolic and diastolic CHF (congestive heart failure) (HCC)   PAD (peripheral artery disease) (HCC)   Thrombocytopenia (HCC)   Coronary artery disease involving native coronary artery of native heart without angina pectoris   Anxiety   LOS: 4 days   A & P   Dyspnea: Multifactorial from symptomatic aortic stenosis, pulmonary edema, and progressive deconditioning from his chronic comorbidities.  Cardiology and nephrology following. Plan for volume control with HD today per nephrology  ESRD on MWF HD: Nephrology consulted by EDP, plan for HD today. The patient will be continued on midodrine with HD. Pt had dialysis today.  Chronic combined systolic and diastolic CHF: EF 63-01% by TTE 07/25/2018.  He does have rales and  edema on both legs.  Volume is controlled by dialysis.  He is previously on Lopressor, however states he is not taking it.  Consider adding back if BP allows. TSH normal.  Severe aortic stenosis: Has plans for TAVR on 10/21/2018. Cardiology has been consulted and the patient has been seen by the TAVR team. He has undergone TAVR today and has tolerated the procedure well.  CAD: Severe single-vessel CAD with chronic total occlusion of the RCA noted on left heart cath 07/24/2018.  Patient denies any chest pain.  High-sensitivity troponin minimally elevated and has trended down on repeat. The patient will be continued on aspirin, Plavix, atorvastatin as at home.  PAD status post left femoropopliteal bypass: Continue aspirin, Plavix, atorvastatin.  Hypertension: Actually on midodrine for hypotension prior to HD. No longer on antihypertensives. Monitor.  Thrombocytopenia: Chronic and stable compared to recent labs.  He denies any obvious bleeding.  Monitor closely while on aspirin and Plavix.  Hyperlipidemia: Continue atorvastatin.  Generalized weakness/deconditioning:Due to his chronic comorbidities and likely cardiac  cachexia. PT/OT eval and treat.  I have seen and examined this patient myself. I have spent 28 minutes in his evaluation and care.  DVT prophylaxis: SCDs Code Status: Full code, confirmed with patient Family Communication: Discussed with patient, he has discussed with daughter Disposition Plan: Pending clinical progress  Paulita Licklider, DO [facility] Direct contact: see [email or web address]  7PM-7AM contact night coverage as above 10/21/2018, 5:57 PM  LOS: 1 day   ADDENDUM: Attending status has been assumed by Dr. Burt Knack. I will sign off for now. The hospitalist service remains available for questions as necessary.

## 2018-10-21 NOTE — Progress Notes (Signed)
Report called to Leann in preop, belongings sent with duaghter, pt has been NPO , denies cp or sob, CCMD called to advise to OR

## 2018-10-21 NOTE — Anesthesia Procedure Notes (Signed)
Central Venous Catheter Insertion Performed by: Nolon Nations, MD, anesthesiologist Start/End8/01/2019 10:50 AM, 10/21/2018 11:00 AM Patient location: Pre-op. Preanesthetic checklist: patient identified, IV checked, site marked, risks and benefits discussed, surgical consent, monitors and equipment checked, pre-op evaluation, timeout performed and anesthesia consent Lidocaine 1% used for infiltration and patient sedated Hand hygiene performed  and maximum sterile barriers used  Catheter size: 8 Fr Total catheter length 16. Central line was placed.Double lumen Procedure performed using ultrasound guided technique. Ultrasound Notes:anatomy identified, needle tip was noted to be adjacent to the nerve/plexus identified, no ultrasound evidence of intravascular and/or intraneural injection and image(s) printed for medical record Attempts: 1 Following insertion, dressing applied, line sutured and Biopatch. Post procedure assessment: blood return through all ports, free fluid flow and no air  Patient tolerated the procedure well with no immediate complications.

## 2018-10-21 NOTE — Discharge Instructions (Signed)
ACTIVITY AND EXERCISE °• Daily activity and exercise are an important part of your recovery. People recover at different rates depending on their general health and type of valve procedure. °• Most people recovering from TAVR feel better relatively quickly  °• No lifting, pushing, pulling more than 10 pounds (examples to avoid: groceries, vacuuming, gardening, golfing): °            - For one week with a procedure through the groin. °            - For six weeks for procedures through the chest wall or neck °NOTE: You will typically see one of our providers 7-14 days after your procedure to discuss WHEN TO RESUME the above activities.  °  °  °DRIVING °• Do not drive for until you are seen for follow up and cleared by a provider. Generally, we ask patient to not drive for 1 week after their procedure. °• If you have been told by your doctor in the past that you may not drive, you must talk with him/her before you begin driving again. °  °  °DRESSING °• Groin site: you may leave the clear dressing over the site for up to one week or until it falls off. °  °  °HYGIENE °• If you had a femoral (leg) procedure, you may take a shower when you return home. After the shower, pat the site dry. Do NOT use powder, oils or lotions in your groin area until the site has completely healed. °• If you had a chest procedure, you may shower when you return home unless specifically instructed not to by your discharging practitioner. °            - DO NOT scrub incision; pat dry with a towel °            - DO NOT apply any lotions, oils, powders to the incision °            - No tub baths / swimming for at least 2 weeks. °• If you notice any fevers, chills, increased pain, swelling, bleeding or pus, please contact your doctor. °  °ADDITIONAL INFORMATION °• If you are going to have an upcoming dental procedure, please contact our office as you will require antibiotics ahead of time to prevent infection on your heart valve.  ° ° °If you  have any questions or concerns you can call the structural heart phone during normal business hours 8am-4pm. If you have an urgent need after hours or weekends please call [phone] to talk to the on call provider for general cardiology. If you have an emergency that requires immediate attention, please call 911.  ° ° °After TAVR Checklist ° °Check  Test Description  ° Follow up appointment in 1-2 weeks  You will see our structural heart physician assistant, Katie Delma Villalva. Your incision sites will be checked and you will be cleared to drive and resume all normal activities if you are doing well.    ° 1 month echo and follow up  You will have an echo to check on your new heart valve and be seen back in the office by Katie Didier Brandenburg. Many times the echo is not read by your appointment time, but Katie will call you later that day or the following day to report your results.  ° Follow up with your primary cardiologist You will need to be seen by your primary cardiologist in the following 3-6 months after your 1   month appointment in the valve clinic. Often times your Plavix or Aspirin will be discontinued during this time, but this is decided on a case by case basis.   ° 1 year echo and follow up You will have another echo to check on your heart valve after 1 year and be seen back in the office by Katie Lucee Brissett. This your last structural heart visit.  ° Bacterial endocarditis prophylaxis  You will have to take antibiotics for the rest of your life before all dental procedures (even teeth cleanings) to protect your heart valve. Antibiotics are also required before some surgeries. Please check with your cardiologist before scheduling any surgeries. Also, please make sure to tell us if you have a penicillin allergy as you will require an alternative antibiotic.   ° ° °

## 2018-10-21 NOTE — Anesthesia Preprocedure Evaluation (Addendum)
Anesthesia Evaluation  Patient identified by MRN, date of birth, ID band Patient awake    Reviewed: Allergy & Precautions, NPO status , Patient's Chart, lab work & pertinent test results, reviewed documented beta blocker date and time   History of Anesthesia Complications (+) PONV and history of anesthetic complications  Airway Mallampati: II  TM Distance: >3 FB Neck ROM: Full    Dental  (+) Edentulous Lower, Edentulous Upper   Pulmonary shortness of breath, pneumonia, former smoker,    Pulmonary exam normal breath sounds clear to auscultation       Cardiovascular hypertension, Pt. on medications and Pt. on home beta blockers + CAD, + Past MI, + Peripheral Vascular Disease, +CHF and + DOE  Normal cardiovascular exam+ Valvular Problems/Murmurs AS  Rhythm:Regular Rate:Normal  Echo 07/2018   1. The left ventricle has moderately reduced systolic function, with an ejection fraction of 35-40%. The cavity size was normal. There is moderately increased left ventricular wall thickness. Left ventricular diastolic Doppler parameters are consistent with impaired relaxation. Elevated left atrial and left ventricular end-diastolic pressures The E/e' is >15.  2. Moderate akinesis of the left ventricular, entire inferior wall.  3. The right ventricle has normal systolic function. The cavity was normal. There is no increase in right ventricular wall thickness.  4. The mitral valve is abnormal. Mild thickening of the mitral valve leaflet. There is mild mitral annular calcification present.  5. The tricuspid valve is grossly normal.  6. The aortic valve is tricuspid. Moderate calcification of the aortic valve. Aortic valve regurgitation is mild by color flow Doppler. Moderate-severe stenosis of the aortic valve.  7. The inferior vena cava was dilated in size with <50% respiratory variability.  8. The interatrial septum was not assessed.  9. When compared  to the prior study: 03/24/2018: LVEF 40-45%, moderate aortic stenosis - mean gradient 30 mmHg.     Neuro/Psych Anxiety negative neurological ROS     GI/Hepatic Neg liver ROS, GERD  Medicated and Controlled,  Endo/Other  negative endocrine ROS  Renal/GU ESRF and DialysisRenal disease     Musculoskeletal  (+) Arthritis ,   Abdominal   Peds  Hematology  (+) anemia , HLD   Anesthesia Other Findings nonviable tissue  Reproductive/Obstetrics                             Anesthesia Physical  Anesthesia Plan  ASA: IV  Anesthesia Plan: MAC   Post-op Pain Management:    Induction: Intravenous  PONV Risk Score and Plan: 3 and Ondansetron, Dexamethasone, Treatment may vary due to age or medical condition, Propofol infusion and TIVA  Airway Management Planned: Natural Airway  Additional Equipment: Arterial line and CVP  Intra-op Plan:   Post-operative Plan:   Informed Consent: I have reviewed the patients History and Physical, chart, labs and discussed the procedure including the risks, benefits and alternatives for the proposed anesthesia with the patient or authorized representative who has indicated his/her understanding and acceptance.     Dental advisory given  Plan Discussed with: CRNA  Anesthesia Plan Comments: (Reviewed PAT note written 08/18/2018 by Myra Gianotti, PA-C. )       Anesthesia Quick Evaluation

## 2018-10-21 NOTE — Progress Notes (Signed)
  [facility] VALVE TEAM  Patient doing well s/p TAVR. He is hemodynamically stable. Groin sites stable. ECG with sinus with RBBB, but no high grade block. Arterial line discontinued and transferred  to 4E. Plan for early ambulation after bedrest completed. Plan for hemodialysis per nephrology tomorrow and discharge over the next 48 hours. Will get PT assessment to see if patient needs advanced home care or SNF placement at discharge.   Angelena Form PA-C  [facility]  Pager [phone]

## 2018-10-21 NOTE — Op Note (Signed)
[facility]   MULTIDISCIPLINARY HEART VALVE TEAM   TAVR OPERATIVE NOTE   Date of Procedure:  10/21/2018  Preoperative Diagnosis: Severe Aortic Stenosis   Postoperative Diagnosis: Same   Procedure:    Transcatheter Aortic Valve Replacement - Percutaneous Right Transfemoral Approach  Edwards Sapien 3 Ultra THV (size 26 mm, model # 9750TFX, serial # [id number])   Co-Surgeons:  Valentina Gu. Roxy Manns, MD and Sherren Mocha, MD  Anesthesiologist:  Nolon Nations, MD  Echocardiographer:  Jenkins Rouge, MD  Pre-operative Echo Findings:  Severe aortic stenosis  Moderate-severe left ventricular systolic dysfunction  Post-operative Echo Findings:  No paravalvular leak  Unchanged left ventricular systolic function   BRIEF CLINICAL NOTE AND INDICATIONS FOR SURGERY  Patient has 68 year old male with complicated past medical history including history of aortic stenosis, coronary artery disease status post acute myocardial infarction in 2019, single-vessel coronary artery disease with chronic occlusion of the right coronary artery, chronic combined systolic and diastolic congestive heart failure, amyloidosis with renal involvement, hypertension, end-stage renal disease for which the patient has been dialysis dependent for more than 3 years, chronic anemia, peripheral arterial disease status post left femoral popliteal bypass using Gore-Tex graft and status post amputation multiple toes left foot, chronic seroma left groin, peripheral neuropathy, degenerative disc disease of the lumbar spine, restless leg syndrome, and GE reflux disease who has been referred for surgical consultation to discuss treatment options for management of severe symptomatic aortic stenosis.  Patient's cardiac history dates back to September 2019 the patient was hospitalized with acute respiratory failure in the setting of hypertensive urgency.  Troponin levels were elevated and EKG revealed diffuse ST segment  depression.  Cardiac catheterization revealed occlusion of the right coronary artery with nonobstructive disease in the left coronary territory.  The right coronary artery could not be engaged.  He was treated medically.  Follow-up echocardiogram performed December 19, 2017 revealed normal left ventricular systolic function with moderate concentric hypertrophy and findings worrisome for possible amyloid infiltration.  The valve was felt to be functionally bicuspid with moderate aortic stenosis, peak velocity across aortic valve reported 3.85 m/s corresponding to mean transvalvular gradient estimated 31 mmHg.  Patient remained stable until January of this year when he was hospitalized again with an exacerbation of congestive heart failure.  Echocardiogram performed at that time revealed what was felt to be significant drop in left ventricular ejection fraction to 45%.  Transvalvular gradient across the aortic valve was unchanged.  The patient was seen in follow-up by Dr. Angelena Form in February of this year but plans for further evaluation to consider transcatheter aortic valve replacement were delayed because of COVID-19.  In May the patient was hospitalized with critical limb ischemia involving his left lower extremity with 2 gangrenous toes.  He was found to have bilateral chronic occlusion of the superficial femoral arteries as well as the left popliteal artery.  He underwent left common femoral to below-knee popliteal artery bypass graft using synthetic Gore-Tex graft.  Postoperatively he suffered an acute exacerbation of chronic combined systolic and diastolic congestive heart failure with mildly positive troponins consistent with non-ST segment elevation myocardial infarction.  Catheterization revealed no significant change in his coronary artery disease with chronic occlusion of the right coronary artery and no significant disease in the left coronary circulation.  Echocardiogram revealed further drop in left  ventricular systolic function with ejection fraction estimated 35 to 40%.  There was at least moderate and probably severe aortic stenosis.  Peak velocity across aortic valve  measured as high as 3.7 m/s corresponding to mean transvalvular gradient estimated 30 mmHg and aortic valve area calculated 0.87 cm.  The DVI was notably only 0.20.  His subsequent recovery was slow and he later underwent amputation of his gangrenous toes.  He had a prolonged recovery requiring inpatient rehab.  He developed a large seroma in the left groin associated with his surgical incision which has been treated conservatively.  More recently he has been having problems tolerating dialysis with tendency to get hypotension towards the completion of dialysis treatments.  CT angiography was performed to evaluate the feasibility of TAVR and the patient referred for surgical consultation.  During the course of the patient's preoperative work up they have been evaluated comprehensively by a multidisciplinary team of specialists coordinated through the [facility] the [facility].  They have been demonstrated to suffer from symptomatic severe aortic stenosis as noted above. The patient has been counseled extensively as to the relative risks and benefits of all options for the treatment of severe aortic stenosis including long term medical therapy, conventional surgery for aortic valve replacement, and transcatheter aortic valve replacement.  All questions have been answered, and the patient provides full informed consent for the operation as described.   DETAILS OF THE OPERATIVE PROCEDURE  PREPARATION:    The patient is brought to the operating room on the above mentioned date and appropriate monitoring was established by the anesthesia team. The patient is placed in the supine position on the operating table.  Intravenous antibiotics are administered. The patient is monitored  closely throughout the procedure under conscious sedation.  Baseline transthoracic echocardiogram was performed. The patient's chest, abdomen, both groins, and both lower extremities are prepared and draped in a sterile manner. A time out procedure is performed.   PERIPHERAL ACCESS:    An arterial sheath is placed in the patient's left brachial artery under ultrasound guidance.  Using the modified Seldinger technique, femoral venous access was obtained with placement of 6 Fr sheath on the right side.  A pigtail diagnostic catheter was passed through the left brachial arterial sheath under fluoroscopic guidance into the aortic root.  A temporary transvenous pacemaker catheter was passed through the right femoral venous sheath under fluoroscopic guidance into the right ventricle.  The pacemaker was tested to ensure stable lead placement and pacemaker capture. Aortic root angiography was performed in order to determine the optimal angiographic angle for valve deployment.   TRANSFEMORAL ACCESS:   Percutaneous transfemoral access and sheath placement was performed using ultrasound guidance.  The right common femoral artery was cannulated using a micropuncture needle and appropriate location was verified using hand injection angiogram.  A pair of Abbott Perclose percutaneous closure devices were placed and a 6 French sheath replaced into the femoral artery.  The patient was heparinized systemically and ACT verified > 250 seconds.    A 14 Fr transfemoral E-sheath was introduced into the right common femoral artery after progressively dilating over an Amplatz superstiff wire. An AL-2 catheter was used to direct a straight-tip exchange length wire across the native aortic valve into the left ventricle. This was exchanged out for a pigtail catheter and position was confirmed in the LV apex.  The pigtail catheter was exchanged for an Amplatz Extra-stiff wire in the LV apex.  Echocardiography was utilized to  confirm appropriate wire position and no sign of entanglement in the mitral subvalvular apparatus.   TRANSCATHETER HEART VALVE DEPLOYMENT:   An  Edwards Sapien 3 Ultra transcatheter heart valve (size 26 mm, model #9750TFX, serial #[id number]) was prepared and crimped per manufacturer's guidelines, and the proper orientation of the valve is confirmed on the [employer] delivery system. The valve was advanced through the introducer sheath using normal technique until in an appropriate position in the abdominal aorta beyond the sheath tip. The balloon was then retracted and using the fine-tuning wheel was centered on the valve. The valve was then advanced across the aortic arch using appropriate flexion of the catheter. The valve was carefully positioned across the aortic valve annulus. The Commander catheter was retracted using normal technique. Once final position of the valve has been confirmed by angiographic assessment, the valve is deployed while temporarily holding ventilation and during rapid ventricular pacing to maintain systolic blood pressure < 50 mmHg and pulse pressure < 10 mmHg. The balloon inflation is held for >3 seconds after reaching full deployment volume. Once the balloon has fully deflated the balloon is retracted into the ascending aorta and valve function is assessed using echocardiography. There is felt to be no paravalvular leak and no central aortic insufficiency.  The patient's hemodynamic recovery following valve deployment is good.  The deployment balloon and guidewire are both removed.    PROCEDURE COMPLETION:   The sheath was removed and femoral artery closure performed.  Protamine was administered once femoral arterial repair was complete. The temporary pacemaker, pigtail catheters, femoral and brachial sheaths were removed with manual pressure used for hemostasis.    The patient tolerated the procedure well and is transported to the surgical intensive care in stable  condition. There were no immediate intraoperative complications. All sponge instrument and needle counts are verified correct at completion of the operation.   No blood products were administered during the operation.  The patient received a total of 40 mL of intravenous contrast during the procedure.   Rexene Alberts, MD 10/21/2018 1:29 PM

## 2018-10-21 NOTE — Transfer of Care (Signed)
Immediate Anesthesia Transfer of Care Note  Patient: Jose Garrett  Procedure(s) Performed: TRANSCATHETER AORTIC VALVE REPLACEMENT, TRANSFEMORAL (N/A Chest) Intraoperative Transthoracic Echocardiogram (N/A )  Patient Location: Cath Lab  Anesthesia Type:MAC  Level of Consciousness: awake, alert  and oriented  Airway & Oxygen Therapy: Patient Spontanous Breathing and Patient connected to nasal cannula oxygen  Post-op Assessment: Report given to RN and Post -op Vital signs reviewed and stable  Post vital signs: Reviewed and stable  Last Vitals:  Vitals Value Taken Time  BP 88/26 10/21/18 1356  Temp 36.5 C 10/21/18 1342  Pulse 31 10/21/18 1356  Resp 18 10/21/18 1356  SpO2 91 % 10/21/18 1356  Vitals shown include unvalidated device data.  Last Pain:  Vitals:   10/21/18 1342  TempSrc: Temporal  PainSc: Asleep         Complications: No apparent anesthesia complications

## 2018-10-21 NOTE — Progress Notes (Signed)
[facility] Progress Note  Subjective: feeling well, TAVR scheduled for today.  [name] yesterday fine.    Vitals:   10/20/18 1700 10/20/18 1730 10/20/18 1925 10/21/18 0633  BP: 98/67 (!) 118/47 92/68 (!) 109/53  Pulse: 86 83 88 (!) 46  Resp: 16 17 16 16   Temp:  98.3 F (36.8 C) 97.6 F (36.4 C) 98.5 F (36.9 C)  TempSrc:  Oral Oral Oral  SpO2:  95% 96% 90%  Weight:  84.7 kg  84.1 kg  Height:        Inpatient medications: . aspirin EC  81 mg Oral QPM  . atorvastatin  80 mg Oral Q supper  . bisacodyl  5 mg Oral Once  . calcitRIOL  1.5 mcg Oral Q M,W,F-HD  . chlorhexidine  1 application Topical Once  . Chlorhexidine Gluconate Cloth  6 each Topical Q0600  . cinacalcet  180 mg Oral Q M,W,F-HD  . clopidogrel  75 mg Oral Q supper  . gabapentin  300 mg Oral Daily  . loratadine  10 mg Oral Daily  . magnesium sulfate  40 mEq Other To OR  . midodrine  10 mg Oral Q M,W,F-HD  . multivitamin  1 tablet Oral QHS  . pantoprazole  40 mg Oral QPM  . potassium chloride  80 mEq Other To OR  . sodium chloride flush  3 mL Intravenous Q12H  . sucroferric oxyhydroxide  1,500 mg Oral TID WC  . vitamin B-12  1,000 mcg Oral QPM   . cefUROXime (ZINACEF)  IV    . dexmedetomidine    . heparin 30,000 units/NS 1000 mL solution for CELLSAVER    . norepinephrine (LEVOPHED) Adult infusion    . vancomycin     acetaminophen **OR** acetaminophen, LORazepam, oxyCODONE, temazepam    Exam: General: Well developed, well nourished, in no acute distress Head: Normocephalic, atraumatic, sclera non-icteric Neck:  JVD not elevated. Lungs: Decreased lung sounds at bases. No wheezing, rhonchi or rales Heart: RRR with normal S1, S2. 3/6 systolic murmur Abdomen: Soft, non-tender, non-distended with normoactive bowel sounds Lower extremities: no edema Neuro: Alert and oriented X 3. Moves all extremities spontaneously. Dialysis Access: RUE AVF +t/b  Dialysis:  [facility] MWF  4h  400/800  82.5kg  2/2  bath  Hep 8000  RUE AVF Sensipar 180mg  PO TIW with dialysis Mircera 151mcg IV q2weeks (last given 10/15/2018) Calcitriol 1.5 mcg PO TIW velphoro 3 tabs PO TID AC and 2/snack  Recent labs:  Hgb 10.4, Tsat 29; Ferritin 1303; Plt 62K; K+ 4.6; Albumin 4.1; calcium 9.4; {hps 5.8; PTH 615   Assessment/Plan: 1.  Dyspnea: Improved s/p UF here.  May improve further after TAVR 2.  Severe AS: for TAVR procedure this week, per Cardiology. Scheduled for today. 3.  ESRD:  MWF. Tolerated HD yesterday without issue, resume normal schedule tomorrow as his post TAVR hemodynamics allow.     4.  Hypertension/volume:  Continue midodrine before dialysis.   5.  Anemia: Hgb 9.3. Not due for ESA yet.  Last iron level ok.   6.  Metabolic bone disease: Calcium 9.6. Recent PTH elevated. Will continue sensipar and calcitriol. Phos 7.0- continue home binder.  7.  Nutrition:  Renal diet with fluid restrictions 8. Aortic stenosis: Plan for TAVR 10/21/2018. Per cardiology 9. Thrombocytopenia: BP stable, is on aspirin and plavix. Has tolerated heparin with dialysis at outpatient unit.    Jannifer Hick MD [facility] Pager [phone]  Iron/TIBC/Ferritin/ %Sat    Component  Value Date/Time   IRON 44 (L) 08/06/2018 1410   IRON 34 (L) 09/24/2014 1224   TIBC 190 (L) 08/06/2018 1410   TIBC 284 09/24/2014 1224   FERRITIN 974 (H) 08/06/2018 1410   FERRITIN 335 (H) 12/20/2015 1009   IRONPCTSAT 23 08/06/2018 1410   IRONPCTSAT 12 (L) 09/24/2014 1224   IRONPCTSAT 36 03/02/2014 1227   Recent Labs  Lab 10/16/18 1045  10/19/18 1044  10/21/18 0336  NA 138   < > 139   < > 139  K 3.7   < > 4.0   < > 4.5  CL 96*   < > 97*   < > 96*  CO2 26   < > 22   < > 28  GLUCOSE 98   < > 121*   < > 100*  BUN 26*   < > 65*   < > 24*  CREATININE 6.35*   < > 10.77*   < > 6.00*  CALCIUM 9.6   < > 9.2   < > 9.6  PHOS  --    < > 6.9*  --   --   ALBUMIN 3.9   < > 3.6  --   --   INR 1.3*  --   --   --   --    < > = values  in this interval not displayed.   Recent Labs  Lab 10/16/18 1045  AST 17  ALT 13  ALKPHOS 58  BILITOT 1.1  PROT 7.0   Recent Labs  Lab 10/20/18 0638  WBC 4.2  HGB 9.3*  HCT 29.1*  PLT 64*

## 2018-10-21 NOTE — Op Note (Signed)
[facility]   MULTIDISCIPLINARY HEART VALVE TEAM   TAVR OPERATIVE NOTE   Date of Procedure:  10/21/2018  Preoperative Diagnosis: Severe Aortic Stenosis   Postoperative Diagnosis: Same   Procedure:    Transcatheter Aortic Valve Replacement - Percutaneous Transfemoral Approach  Edwards Sapien 3 THV (size 26 mm, model # 9750TFX, serial # [id number])   Co-Surgeons:  Valentina Gu. Roxy Manns, MD and Sherren Mocha, MD  Anesthesiologist:  Nolon Nations, MD  Echocardiographer:  Jenkins Rouge, MD  Pre-operative Echo Findings:  Severe aortic stenosis  Moderate left ventricular systolic dysfunction  Post-operative Echo Findings:  No paravalvular leak  Unchanged left ventricular systolic function  BRIEF CLINICAL NOTE AND INDICATIONS FOR SURGERY  68 year old male with multiple medical problems including end-stage renal disease, coronary artery disease with chronic occlusion of the RCA, peripheral arterial disease with left femoropopliteal bypass, and progressive now severe aortic stenosis (low-flow low gradient) presents for TAVR today.  During the course of the patient's preoperative work up they have been evaluated comprehensively by a multidisciplinary team of specialists coordinated through the [facility] in the [facility].  They have been demonstrated to suffer from symptomatic severe aortic stenosis as noted above. The patient has been counseled extensively as to the relative risks and benefits of all options for the treatment of severe aortic stenosis including long term medical therapy, conventional surgery for aortic valve replacement, and transcatheter aortic valve replacement.  The patient has been independently evaluated in formal cardiac surgical consultation by Dr Roxy Manns, who deemed the patient appropriate for TAVR. Based upon review of all of the patient's preoperative diagnostic tests they are felt to be candidate for  transcatheter aortic valve replacement using the transfemoral approach as an alternative to conventional surgery.    Following the decision to proceed with transcatheter aortic valve replacement, a discussion has been held regarding what types of management strategies would be attempted intraoperatively in the event of life-threatening complications, including whether or not the patient would be considered a candidate for the use of cardiopulmonary bypass and/or conversion to open sternotomy for attempted surgical intervention.  The patient has been advised of a variety of complications that might develop peculiar to this approach including but not limited to risks of death, stroke, paravalvular leak, aortic dissection or other major vascular complications, aortic annulus rupture, device embolization, cardiac rupture or perforation, acute myocardial infarction, arrhythmia, heart block or bradycardia requiring permanent pacemaker placement, congestive heart failure, respiratory failure, renal failure, pneumonia, infection, other late complications related to structural valve deterioration or migration, or other complications that might ultimately cause a temporary or permanent loss of functional independence or other long term morbidity.  The patient provides full informed consent for the procedure as described and all questions were answered preoperatively.  DETAILS OF THE OPERATIVE PROCEDURE  PREPARATION:   The patient is brought to the operating room on the above mentioned date and central monitoring was established by the anesthesia team including placement of a central venous catheter and radial arterial line. The patient is placed in the supine position on the operating table.  Intravenous antibiotics are administered. The patient is monitored closely throughout the procedure under conscious sedation.  Baseline transthoracic echocardiogram is performed. The patient's chest, abdomen, both groins, and  both lower extremities are prepared and draped in a sterile manner. A time out procedure is performed.   PERIPHERAL ACCESS:   Using ultrasound guidance, femoral venous access is obtained with placement of 6  Fr sheaths on the right side.  The patient has a seroma on the left side so we decided to avoid this area.  There had been unsuccessful attempts at a radial arterial line on the left arm.  The patient has an indwelling right AV fistula.  Because of access limitations, we elected to use the left brachial approach for placement of a 5/6 French slender sheath and pigtail catheter.  Using ultrasound guidance, the left brachial artery was accessed with a front wall puncture.  The slender sheath was advanced without difficulty and a pigtail catheter was advanced into the aortic root.  A temporary transvenous pacemaker catheter was passed through the femoral venous sheath under fluoroscopic guidance into the right ventricle.  The pacemaker was tested to ensure stable lead placement and pacemaker capture. Aortic root angiography was performed in order to determine the optimal angiographic angle for valve deployment.  TRANSFEMORAL ACCESS:  A micropuncture technique is used to access the right femoral artery under fluoroscopic and ultrasound guidance.  2 Perclose devices are deployed at 10' and 2' positions to 'PreClose' the femoral artery. An 8 French sheath is placed and then an Amplatz Superstiff wire is advanced through the sheath. This is changed out for a 14 French transfemoral E-Sheath after progressively dilating over the Superstiff wire.  An AL-2 catheter was used to direct a straight-tip exchange length wire across the native aortic valve into the left ventricle. This was exchanged out for a pigtail catheter and position was confirmed in the LV apex.  The pigtail catheter was exchanged for an Amplatz Extra-stiff wire in the LV apex.    BALLOON AORTIC VALVULOPLASTY:  Not performed  TRANSCATHETER HEART  VALVE DEPLOYMENT:  An Edwards Sapien 3 transcatheter heart valve (size 26 mm) was prepared and crimped per manufacturer's guidelines, and the proper orientation of the valve is confirmed on the [employer] delivery system. The valve was advanced through the introducer sheath using normal technique until in an appropriate position in the abdominal aorta beyond the sheath tip. The balloon was then retracted and using the fine-tuning wheel was centered on the valve. The valve was then advanced across the aortic arch using appropriate flexion of the catheter. The valve was carefully positioned across the aortic valve annulus. The Commander catheter was retracted using normal technique. Once final position of the valve has been confirmed by angiographic assessment, the valve is deployed while temporarily holding ventilation and during rapid ventricular pacing to maintain systolic blood pressure < 50 mmHg and pulse pressure < 10 mmHg. The balloon inflation is held for >3 seconds after reaching full deployment volume. Once the balloon has fully deflated the balloon is retracted into the ascending aorta and valve function is assessed using echocardiography. The patient's hemodynamic recovery following valve deployment is good.  The deployment balloon and guidewire are both removed. Echo demostrated acceptable post-procedural gradients, stable mitral valve function, and no aortic insufficiency.    PROCEDURE COMPLETION:  The sheath was removed and femoral artery closure is performed using the 2 previously deployed Perclose devices.  Protamine is administered once femoral arterial repair was complete. The site is clear with no evidence of bleeding or hematoma after the sutures are tightened. The temporary pacemaker and pigtail catheters are removed.  Manual hemostasis is used for removal of the left brachial sheath and right femoral venous sheath.  The patient tolerated the procedure well and is transported to the  surgical intensive care in stable condition. There were no immediate intraoperative complications. All  sponge instrument and needle counts are verified correct at completion of the operation.   The patient received a total of 40 mL of intravenous contrast during the procedure.   Sherren Mocha, MD 10/21/2018 2:02 PM

## 2018-10-21 NOTE — Anesthesia Procedure Notes (Signed)
Arterial Line Insertion Performed by: Nolon Nations, MD, Verdie Drown, CRNA, anesthesiologist  Patient location: Pre-op. Preanesthetic checklist: patient identified, IV checked, site marked, risks and benefits discussed, surgical consent, monitors and equipment checked, pre-op evaluation, timeout performed and anesthesia consent Lidocaine 1% used for infiltration Left, radial was placed Catheter size: 20 Fr Hand hygiene performed  and maximum sterile barriers used   Attempts: 4 Procedure performed using ultrasound guided technique. Ultrasound Notes:image(s) printed for medical record Following insertion, dressing applied and Biopatch. Post procedure assessment: normal and unchanged  Additional procedure comments: Several attempts of distal radial, but difficulty threading wire. Artery severely calcified on ultrasound image. Moved proximal. .

## 2018-10-21 NOTE — Progress Notes (Signed)
Discussed ua and pt says he does not void and will not be able to give [place] specimen

## 2018-10-22 ENCOUNTER — Encounter (HOSPITAL_COMMUNITY): Payer: Self-pay | Admitting: Cardiovascular Disease

## 2018-10-22 ENCOUNTER — Inpatient Hospital Stay (HOSPITAL_COMMUNITY): Payer: Medicare Other

## 2018-10-22 DIAGNOSIS — Z952 Presence of prosthetic heart valve: Secondary | ICD-10-CM

## 2018-10-22 LAB — CBC
HCT: 27.9 % — ABNORMAL LOW (ref 39.0–52.0)
Hemoglobin: 8.7 g/dL — ABNORMAL LOW (ref 13.0–17.0)
MCH: 34.1 pg — ABNORMAL HIGH (ref 26.0–34.0)
MCHC: 31.2 g/dL (ref 30.0–36.0)
MCV: 109.4 fL — ABNORMAL HIGH (ref 80.0–100.0)
Platelets: 51 10*3/uL — ABNORMAL LOW (ref 150–400)
RBC: 2.55 MIL/uL — ABNORMAL LOW (ref 4.22–5.81)
RDW: 17.7 % — ABNORMAL HIGH (ref 11.5–15.5)
WBC: 4.5 10*3/uL (ref 4.0–10.5)
nRBC: 0 % (ref 0.0–0.2)

## 2018-10-22 LAB — BASIC METABOLIC PANEL
Anion gap: 13 (ref 5–15)
BUN: 33 mg/dL — ABNORMAL HIGH (ref 8–23)
CO2: 27 mmol/L (ref 22–32)
Calcium: 8.6 mg/dL — ABNORMAL LOW (ref 8.9–10.3)
Chloride: 98 mmol/L (ref 98–111)
Creatinine, Ser: 7.4 mg/dL — ABNORMAL HIGH (ref 0.61–1.24)
GFR calc Af Amer: 8 mL/min — ABNORMAL LOW (ref >60)
GFR calc non Af Amer: 7 mL/min — ABNORMAL LOW (ref >60)
Glucose, Bld: 101 mg/dL — ABNORMAL HIGH (ref 70–99)
Potassium: 4.9 mmol/L (ref 3.5–5.1)
Sodium: 138 mmol/L (ref 135–145)

## 2018-10-22 LAB — MAGNESIUM: Magnesium: 1.7 mg/dL (ref 1.7–2.4)

## 2018-10-22 LAB — ECHOCARDIOGRAM COMPLETE
Height: 70 in
Weight: 2973.56 oz

## 2018-10-22 MED ORDER — ATORVASTATIN CALCIUM 80 MG PO TABS
80.0000 mg | ORAL_TABLET | Freq: Every day | ORAL | Status: DC
  Administered 2018-10-22 – 2018-10-27 (×6): 80 mg via ORAL
  Filled 2018-10-22 (×6): qty 1

## 2018-10-22 MED ORDER — MAGNESIUM SULFATE 2 GM/50ML IV SOLN
2.0000 g | Freq: Once | INTRAVENOUS | Status: AC
  Administered 2018-10-22: 2 g via INTRAVENOUS
  Filled 2018-10-22: qty 50

## 2018-10-22 MED ORDER — ASPIRIN EC 81 MG PO TBEC
81.0000 mg | DELAYED_RELEASE_TABLET | Freq: Every evening | ORAL | Status: DC
  Administered 2018-10-22 – 2018-10-23 (×2): 81 mg via ORAL
  Filled 2018-10-22 (×2): qty 1

## 2018-10-22 MED ORDER — GABAPENTIN 300 MG PO CAPS
300.0000 mg | ORAL_CAPSULE | Freq: Every day | ORAL | Status: DC
  Administered 2018-10-22 – 2018-10-28 (×7): 300 mg via ORAL
  Filled 2018-10-22 (×7): qty 1

## 2018-10-22 MED ORDER — RENA-VITE PO TABS
1.0000 | ORAL_TABLET | Freq: Every day | ORAL | Status: DC
  Administered 2018-10-22 – 2018-10-27 (×6): 1 via ORAL
  Filled 2018-10-22 (×6): qty 1

## 2018-10-22 MED ORDER — SUCROFERRIC OXYHYDROXIDE 500 MG PO CHEW
1000.0000 mg | CHEWABLE_TABLET | Freq: Three times a day (TID) | ORAL | Status: DC
  Administered 2018-10-22 – 2018-10-26 (×10): 1000 mg via ORAL
  Filled 2018-10-22 (×20): qty 2

## 2018-10-22 MED ORDER — PANTOPRAZOLE SODIUM 40 MG PO TBEC
40.0000 mg | DELAYED_RELEASE_TABLET | Freq: Every evening | ORAL | Status: DC
  Administered 2018-10-22 – 2018-10-27 (×6): 40 mg via ORAL
  Filled 2018-10-22 (×6): qty 1

## 2018-10-22 MED ORDER — LORAZEPAM 1 MG PO TABS: 1.0000 mg | ORAL_TABLET | Freq: Two times a day (BID) | ORAL | Status: DC | PRN

## 2018-10-22 MED ORDER — MIDODRINE HCL 5 MG PO TABS
10.0000 mg | ORAL_TABLET | ORAL | Status: DC
  Administered 2018-10-24 – 2018-10-27 (×2): 10 mg via ORAL
  Filled 2018-10-22 (×4): qty 2

## 2018-10-22 MED ORDER — DARBEPOETIN ALFA 60 MCG/0.3ML IJ SOSY
60.0000 ug | PREFILLED_SYRINGE | Freq: Once | INTRAMUSCULAR | Status: DC
  Filled 2018-10-22: qty 0.3

## 2018-10-22 MED ORDER — CLOPIDOGREL BISULFATE 75 MG PO TABS
75.0000 mg | ORAL_TABLET | Freq: Every day | ORAL | Status: DC
  Administered 2018-10-22 – 2018-10-27 (×6): 75 mg via ORAL
  Filled 2018-10-22 (×6): qty 1

## 2018-10-22 NOTE — Progress Notes (Signed)
Paged provider on-call ([facility]) regarding pt's continued Bigeminy now with frequent 5-7 beat runs of VTach. Pt remains asymptomatic. Magnesium repletion ordered. Will administer and continue to monitor.

## 2018-10-22 NOTE — Progress Notes (Signed)
  Echocardiogram 2D Echocardiogram has been performed.  [name] 10/22/2018, 2:13 PM

## 2018-10-22 NOTE — Progress Notes (Addendum)
[facility] VALVE TEAM  Patient Name: Jose Garrett Date of Encounter: 10/22/2018  Primary Cardiologist: Dr. Angelena Form Dr. Burt Knack & Dr. Roxy Manns (TAVR)   Hospital Problem List     Principal Problem:   S/P TAVR (transcatheter aortic valve replacement) Active Problems:   Acute on chronic combined systolic and diastolic CHF (congestive heart failure) ([place])   Essential hypertension, benign   AL amyloidosis (HCC)   Anemia of chronic disease   ESRD (end stage renal disease) on dialysis ([place])   PAD (peripheral artery disease) (HCC)   Thrombocytopenia (HCC)   Coronary artery disease involving native coronary artery of native heart without angina pectoris   Anxiety    Subjective   Seen in HD. Doing well. No complaints. (later after I saw the patient he complained of not feeling well and terminated HD early).  Inpatient Medications    Scheduled Meds: . aspirin EC  81 mg Oral QPM  . atorvastatin  80 mg Oral Q supper  . Chlorhexidine Gluconate Cloth  6 each Topical Q0600  . clopidogrel  75 mg Oral Q supper  . gabapentin  300 mg Oral Daily  . midodrine  10 mg Oral Q M,W,F-HD  . multivitamin  1 tablet Oral QHS  . pantoprazole  40 mg Oral QPM  . sodium chloride flush  3 mL Intravenous Q12H  . sucroferric oxyhydroxide  1,000 mg Oral TID WC   Continuous Infusions: . sodium chloride    . cefUROXime (ZINACEF)  IV Stopped (10/21/18 2234)  . nitroGLYCERIN    . phenylephrine (NEO-SYNEPHRINE) Adult infusion     PRN Meds: sodium chloride, acetaminophen **OR** acetaminophen, LORazepam, morphine injection, ondansetron (ZOFRAN) IV, oxyCODONE, sodium chloride flush, traMADol   Vital Signs    Vitals:   10/22/18 0800 10/22/18 0830 10/22/18 0900 10/22/18 0930  BP: (!) 104/44 (!) 115/45 (!) 122/46 (!) 132/93  Pulse: 98 (!) 45 (!) 51 66  Resp: 18 17 18 18   Temp:    98.5 F (36.9 C)  TempSrc:    Oral  SpO2: 96% 95% 96% 95%  Weight:    84.3 kg   Height:        Intake/Output Summary (Last 24 hours) at 10/22/2018 1129 Last data filed at 10/22/2018 0930 Gross per 24 hour  Intake 1391.53 ml  Output 410 ml  Net 981.53 ml   Filed Weights   10/22/18 0425 10/22/18 0715 10/22/18 0930  Weight: 85.5 kg 84.7 kg 84.3 kg    Physical Exam    GEN: chronically ill appearing getting HD.  HEENT: Grossly normal.  Neck: Supple, no JVD, or masses. Cardiac: RRR, 2/6 SEM @ RUSB. No rubs, or gallops. No clubbing, cyanosis. 2+ LLE edema. Radials/DP/PT 2+ and equal bilaterally.  Respiratory:  Respirations regular and unlabored, clear to auscultation bilaterally. GI: Soft, nontender, nondistended, BS + x 4. MS: no deformity or atrophy. Skin: warm and dry, no rash. Right groin site clear. Left brachial site stable with mild ecchymosis. Left groin seroma stable.  Neuro:  Strength and sensation are intact. Psych: AAOx3.  Normal affect.  Labs    CBC Recent Labs    10/20/18 0638 10/21/18 1411 10/22/18 0208  WBC 4.2  --  4.5  NEUTROABS 2.8  --   --   HGB 9.3* 9.9* 8.7*  HCT 29.1* 29.0* 27.9*  MCV 106.6*  --  109.4*  PLT 64*  --  51*   Basic Metabolic Panel Recent Labs    10/21/18  7628 10/21/18 1411 10/22/18 0208  NA 139 139 138  K 4.5 4.5 4.9  CL 96* 99 98  CO2 28  --  27  GLUCOSE 100* 96 101*  BUN 24* 27* 33*  CREATININE 6.00* 6.90* 7.40*  CALCIUM 9.6  --  8.6*  MG  --   --  1.7   Liver Function Tests No results for input(s): AST, ALT, ALKPHOS, BILITOT, PROT, ALBUMIN in the last 72 hours. No results for input(s): LIPASE, AMYLASE in the last 72 hours. Cardiac Enzymes No results for input(s): CKTOTAL, CKMB, CKMBINDEX, TROPONINI in the last 72 hours. BNP Invalid input(s): POCBNP D-Dimer No results for input(s): DDIMER in the last 72 hours. Hemoglobin A1C No results for input(s): HGBA1C in the last 72 hours. Fasting Lipid Panel No results for input(s): CHOL, HDL, LDLCALC, TRIG, CHOLHDL, LDLDIRECT in the last 72 hours.  Thyroid Function Tests No results for input(s): TSH, T4TOTAL, T3FREE, THYROIDAB in the last 72 hours.  Invalid input(s): FREET3  Telemetry    Sinus with ventricular bigeminy and short runs of NSVT - Personally Reviewed  ECG    Sinus tachy with HR 101, RBBB and ventricular bigeminy - Personally Reviewed  Radiology    Dg Chest Port 1 View  Result Date: 10/21/2018 CLINICAL DATA:  Status post TAVR. EXAM: PORTABLE CHEST 1 VIEW COMPARISON:  Chest x-ray dated October 17, 2018. FINDINGS: New left internal jugular central venous catheter with tip in the mid SVC. Interval TAVR. Stable mild cardiomegaly. Atherosclerotic calcification of the aortic arch. Normal pulmonary vascularity. No focal consolidation, pleural effusion, or pneumothorax. No acute osseous abnormality. IMPRESSION: 1. Interval TAVR.  No active disease. Electronically Signed   By: Titus Dubin M.D.   On: 10/21/2018 16:34    Cardiac Studies   TAVR OPERATIVE NOTE   Date of Procedure:                10/21/2018  Preoperative Diagnosis:      Severe Aortic Stenosis   Postoperative Diagnosis:    Same   Procedure:        Transcatheter Aortic Valve Replacement - Percutaneous Right Transfemoral Approach             Edwards Sapien 3 Ultra THV (size 26 mm, model # 9750TFX, serial # [id number])              Co-Surgeons:                        Valentina Gu. Roxy Manns, MD and Sherren Mocha, MD  Anesthesiologist:                  Nolon Nations, MD  Echocardiographer:              Jenkins Rouge, MD  Pre-operative Echo Findings: ? Severe aortic stenosis ? Moderate-severe left ventricular systolic dysfunction  Post-operative Echo Findings: ? No paravalvular leak ? Unchanged left ventricular systolic function  __________________   Echo 10/22/18: to be completed today    Patient Profile     Jose Garrett is a 68 y.o. male with a history of of ESRD on HD, AL amyloid (kidney involvement), HTN, HLD, GERD, anemia,  thrombocytopenia, ischemic cardiomyopathy, chronic combined CHF, CAD, carotid artery disease, PAD w/ left femoral to below the knee popliteal bypass with synthetic graft, s/p toe amputations, left groin seroma, and severe AS who presented to [facility] on 10/17/18 with dyspnea and failure to thrive. He was admitted for HD and  monitoring until his planned TAVR on 10/21/18.  Assessment & Plan    Severe AS: s/p successful TAVR with a 26 mm Edwards Sapien Ultra THV via the R TF approach on 10/21/18. Post operative echo to be completed today. Groin site/ brachial site are stable. ECG with old RBBB and no high grade heart block. Continue Asprin and plavix.   ESRD on HD (M,W,F): per nephrology. RN noted that patient did not feel well and self terminated HD today 2 hours early AMA. We are hopeful he will tolerate HD better now that his severe AS is fixed.   Acute on chronic combined S/D CHF: as evidenced by an extremely elevated BNP > 3000 on preadmission lab work and need to admission due to worsening dyspnea. Volume management per nephrology. Patient is anuric. GMDT limited by hypotension with HD.   CAD: pre TAVR cath showed severe single vessel CAD with chronic total occlusion of the RCA, collateralized with L--->R collaterals. Mild calcific nonobstructive LCA disease, anatomy unchanged from previous cath studies. No PCI targets. Continue medical therapy.   PAD: has an appointment with Dr. Donzetta Matters later this month to discuss R LE bypass and possible drainage of L groin seroma    Signed, Angelena Form, PA-C  10/22/2018, 11:29 AM  Pager [phone]  Patient seen, examined. Available data reviewed. Agree with findings, assessment, and plan as outlined by Nell Range, PA-C.  The patient is independently interviewed and examined.  His right groin site is clear with some mild ecchymoses.  The left brachial access site is also clear with mild ecchymoses but no hematoma.  There is a moderate hematoma in the left forearm  with preserved distal pulses.  There is 2+ edema of the left leg which is been chronic.  There is trace right leg edema.  The patient is clinically stable, now postoperative day #1 from TAVR.  He will be treated with aspirin indefinitely and clopidogrel for at least 6 months.  Otherwise continue medical management for his coronary artery disease.  His 2D echocardiogram is pending today.  I would anticipate that he will be ready for discharge home tomorrow.  Sherren Mocha, M.D. 10/22/2018 12:00 PM

## 2018-10-22 NOTE — Progress Notes (Signed)
Paged provider on-call ([facility]) regarding pt's continued Bigeminy now with frequent bursts of VTach. Pt remains asymptomatic and other VSS. AM labs drawn early from [facility] and pending. No orders received, will continue to monitor.

## 2018-10-22 NOTE — Progress Notes (Signed)
PT Cancellation Note  Patient Details Name: Jose Garrett MRN: [id number] DOB: 1950-04-29   Cancelled Treatment:    Reason Eval/Treat Not Completed: Patient at procedure or test/unavailable. Pt in HD this AM. Will check back as time allows.   Benjiman Core, PTA Pager [phone] Acute Rehab  Allena Katz 10/22/2018, 9:37 AM

## 2018-10-22 NOTE — Progress Notes (Signed)
HD tx terminated with 2 hrs remaining as requested by pt.  Pt stated " Take me off, I dont feel good". Early  Termination of HD against medical advice signed. Nephrologist made aware, Pt alert/oriented in no apparent distress. VSS. Reports given  To primary nurse.[name]

## 2018-10-22 NOTE — Progress Notes (Signed)
CARDIAC REHAB PHASE I   PRE:  Rate/Rhythm: 90 SR bigeminy PVCs  BP:  Supine:   Sitting: 129/62  Standing:    SaO2: 91%RA  MODE:  Ambulation: 80 ft   POST:  Rate/Rhythm: 106 ST bigeminy PVCs  BP:  Supine:   Sitting: 129/62  Standing:    SaO2: 95%RA hall 92% room 1425-1500 Waited for pt to eat prior to walk. Pt walked 80 ft on RA with his cane and asst x 2. A little wobbly. Stated he has walker at home but does not like to use. Appeared SOB but sats had improved with activity.  To sitting on side of bed after walk. Tired. Stated he would like to walk again later.  Daughter in room.   Graylon Good, RN BSN  10/22/2018 2:53 PM

## 2018-10-22 NOTE — Progress Notes (Addendum)
[facility] Progress Note  Subjective: s/p TAVR yesterday. Tired this AM, sore from access sites but overall ok.    Vitals:   10/21/18 2110 10/22/18 0006 10/22/18 0411 10/22/18 0425  BP: (!) 111/52 (!) 108/43 (!) 124/44   Pulse: (!) 44 (!) 43    Resp: (!) 22 (!) 22 (!) 22 (!) 26  Temp:  98.4 F (36.9 C) 98.1 F (36.7 C)   TempSrc:  Oral Oral   SpO2: 95% 94% 91%   Weight:    85.5 kg  Height:        Inpatient medications: . Chlorhexidine Gluconate Cloth  6 each Topical Q0600  . sodium chloride flush  3 mL Intravenous Q12H   . sodium chloride    . cefUROXime (ZINACEF)  IV Stopped (10/21/18 2234)  . nitroGLYCERIN    . phenylephrine (NEO-SYNEPHRINE) Adult infusion     sodium chloride, acetaminophen **OR** acetaminophen, morphine injection, ondansetron (ZOFRAN) IV, oxyCODONE, sodium chloride flush, traMADol    Exam: General: Well developed, well nourished, in no acute distress Head: Normocephalic, atraumatic, sclera non-icteric Neck:  JVD not elevated. Lungs: Decreased lung sounds at bases. No wheezing, rhonchi or rales Heart: RRR with normal S1, S2. 3/6 systolic murmur Abdomen: Soft, non-tender, non-distended with normoactive bowel sounds Lower extremities: no edema, LUE extensive ecchymoses Neuro: Alert and oriented X 3. Moves all extremities spontaneously. Dialysis Access: RUE AVF +t/b  Dialysis:  [facility] MWF  4h  400/800  82.5kg  2/2 bath  Hep 8000  RUE AVF Sensipar 180mg  PO TIW with dialysis Mircera 184mcg IV q2weeks (last given 10/15/2018) Calcitriol 1.5 mcg PO TIW velphoro 3 tabs PO TID AC and 2/snack  Recent labs:  Hgb 10.4, Tsat 29; Ferritin 1303; Plt 62K; K+ 4.6; Albumin 4.1; calcium 9.4; {hps 5.8; PTH 615   Assessment/Plan: 1.  Dyspnea: Improved s/p UF here.  May improve further after TAVR 2.  Severe AS: s/p TAVR 8/11.  3.  ESRD:  MWF. Tolerated HD Monday without issue.  On for HD today. UF goal 1L, use 2K with K 4.9.      4.   Hypertension/volume:  Continue midodrine before dialysis.   5.  Anemia: Hgb 9.3 > 8.7. Not due for ESA yet, if fails to improve will need to augment dose.  Last iron level ok.   6.  Metabolic bone disease: Calcium ok. Recent PTH elevated. Will continue sensipar and calcitriol. Phos 7.0- continue home binder.  7.  Nutrition:  Renal diet with fluid restrictions 8. Aortic stenosis: Plan for TAVR 10/21/2018. Per cardiology 9. Thrombocytopenia: plt 51 this AM, is on aspirin and plavix. Has tolerated heparin with dialysis at outpatient unit, holding here.    Jannifer Hick MD [facility] Pager [phone]  Iron/TIBC/Ferritin/ %Sat    Component Value Date/Time   IRON 44 (L) 08/06/2018 1410   IRON 34 (L) 09/24/2014 1224   TIBC 190 (L) 08/06/2018 1410   TIBC 284 09/24/2014 1224   FERRITIN 974 (H) 08/06/2018 1410   FERRITIN 335 (H) 12/20/2015 1009   IRONPCTSAT 23 08/06/2018 1410   IRONPCTSAT 12 (L) 09/24/2014 1224   IRONPCTSAT 36 03/02/2014 1227   Recent Labs  Lab 10/16/18 1045  10/19/18 1044  10/22/18 0208  NA 138   < > 139   < > 138  K 3.7   < > 4.0   < > 4.9  CL 96*   < > 97*   < > 98  CO2 26   < >  22   < > 27  GLUCOSE 98   < > 121*   < > 101*  BUN 26*   < > 65*   < > 33*  CREATININE 6.35*   < > 10.77*   < > 7.40*  CALCIUM 9.6   < > 9.2   < > 8.6*  PHOS  --    < > 6.9*  --   --   ALBUMIN 3.9   < > 3.6  --   --   INR 1.3*  --   --   --   --    < > = values in this interval not displayed.   Recent Labs  Lab 10/16/18 1045  AST 17  ALT 13  ALKPHOS 58  BILITOT 1.1  PROT 7.0   Recent Labs  Lab 10/22/18 0208  WBC 4.5  HGB 8.7*  HCT 27.9*  PLT 51*

## 2018-10-22 NOTE — Progress Notes (Signed)
Pharmacist Heart Failure Core Measure Documentation  Assessment: Jose Garrett has an EF documented as 35-40% on 8/11 by Echo.  Rationale: Heart failure patients with left ventricular systolic dysfunction (LVSD) and an EF < 40% should be prescribed an angiotensin converting enzyme inhibitor (ACEI) or angiotensin receptor blocker (ARB) at discharge unless a contraindication is documented in the medical record.  This patient is not currently on an ACEI or ARB for HF.  This note is being placed in the record in order to provide documentation that a contraindication to the use of these agents is present for this encounter.  ACE Inhibitor or Angiotensin Receptor Blocker is contraindicated (specify all that apply)  []   ACEI allergy AND ARB allergy []   Angioedema []   Moderate or severe aortic stenosis []   Hyperkalemia [x]   Hypotension (on midodrine PTA) []   Renal artery stenosis []   Worsening renal function, preexisting renal disease or dysfunction  Hildred Laser, PharmD Clinical Pharmacist **Pharmacist phone directory can now be found on [email or web address] ([email or web address]).  Listed under [place].

## 2018-10-22 NOTE — Progress Notes (Signed)
@  [place] called to discuss pt's rhythm (Bigeminy with frequent 3-6 beat bursts of VTach) and to request repeat Magnesium level with AM labs. Pt assessed and asymptomatic resting comfortably.  Provider on-call Aundra Dubin) paged regarding above. Page promptly returned and order received for AM Lab. Will obtain and continue to monitor.

## 2018-10-23 DIAGNOSIS — I5043 Acute on chronic combined systolic (congestive) and diastolic (congestive) heart failure: Secondary | ICD-10-CM

## 2018-10-23 DIAGNOSIS — I472 Ventricular tachycardia: Secondary | ICD-10-CM

## 2018-10-23 LAB — BASIC METABOLIC PANEL
Anion gap: 13 (ref 5–15)
BUN: 30 mg/dL — ABNORMAL HIGH (ref 8–23)
CO2: 27 mmol/L (ref 22–32)
Calcium: 9.1 mg/dL (ref 8.9–10.3)
Chloride: 99 mmol/L (ref 98–111)
Creatinine, Ser: 7.19 mg/dL — ABNORMAL HIGH (ref 0.61–1.24)
GFR calc Af Amer: 8 mL/min — ABNORMAL LOW (ref >60)
GFR calc non Af Amer: 7 mL/min — ABNORMAL LOW (ref >60)
Glucose, Bld: 98 mg/dL (ref 70–99)
Potassium: 4.2 mmol/L (ref 3.5–5.1)
Sodium: 139 mmol/L (ref 135–145)

## 2018-10-23 LAB — MAGNESIUM: Magnesium: 2.2 mg/dL (ref 1.7–2.4)

## 2018-10-23 LAB — CBC
HCT: 26.9 % — ABNORMAL LOW (ref 39.0–52.0)
Hemoglobin: 8.4 g/dL — ABNORMAL LOW (ref 13.0–17.0)
MCH: 34.4 pg — ABNORMAL HIGH (ref 26.0–34.0)
MCHC: 31.2 g/dL (ref 30.0–36.0)
MCV: 110.2 fL — ABNORMAL HIGH (ref 80.0–100.0)
Platelets: 44 10*3/uL — ABNORMAL LOW (ref 150–400)
RBC: 2.44 MIL/uL — ABNORMAL LOW (ref 4.22–5.81)
RDW: 17.7 % — ABNORMAL HIGH (ref 11.5–15.5)
WBC: 5.2 10*3/uL (ref 4.0–10.5)
nRBC: 0 % (ref 0.0–0.2)

## 2018-10-23 MED ORDER — AMIODARONE HCL 200 MG PO TABS
400.0000 mg | ORAL_TABLET | Freq: Two times a day (BID) | ORAL | Status: DC
  Administered 2018-10-23 – 2018-10-28 (×11): 400 mg via ORAL
  Filled 2018-10-23 (×11): qty 2

## 2018-10-23 MED ORDER — AMIODARONE HCL 400 MG PO TABS: 400.0000 mg | ORAL_TABLET | Freq: Two times a day (BID) | ORAL | 2 refills | Status: DC

## 2018-10-23 NOTE — Progress Notes (Signed)
[facility] Progress Note   Subjective:   Patient seen in room, sitting up eating breakfast. Reports he terminated HD early yesterday because he felt "worn out," but is feeling better this AM. Denies CP, palpitations, abdominal pain, N/V/D. Per cardiology notes, will likely discharge today.   Objective Vitals:   10/22/18 2045 10/23/18 0017 10/23/18 0334 10/23/18 0858  BP: (!) 142/53 (!) 115/49 (!) 125/39 (!) 116/44  Pulse: 96 (!) 44 88 85  Resp: 20 17 (!) 22 18  Temp:  97.8 F (36.6 C) 97.7 F (36.5 C) 97.8 F (36.6 C)  TempSrc:  Oral Oral Oral  SpO2: 93% 100% 92% 95%  Weight:   86.5 kg   Height:       Physical Exam General: Well developed, alert male, in NAD Heart: RRR, no murmurs, rubs or gallops. Ventricular bigeminy seen on monitor Lungs: CTA bilaterally without wheezing, rhonchi or rales Abdomen: Soft, non-tender, non-distended, +BS Extremities: No peripheral edema Dialysis Access:  RUE AVF + thrill  Additional Objective Labs: Basic Metabolic Panel: Recent Labs  Lab 10/18/18 0334  10/19/18 1044  10/21/18 0336 10/21/18 1411 10/22/18 0208 10/23/18 0432  NA 139   < > 139   < > 139 139 138 139  K 3.8   < > 4.0   < > 4.5 4.5 4.9 4.2  CL 94*   < > 97*   < > 96* 99 98 99  CO2 26   < > 22   < > 28  --  27 27  GLUCOSE 87   < > 121*   < > 100* 96 101* 98  BUN 48*   < > 65*   < > 24* 27* 33* 30*  CREATININE 9.23*   < > 10.77*   < > 6.00* 6.90* 7.40* 7.19*  CALCIUM 8.7*   < > 9.2   < > 9.6  --  8.6* 9.1  PHOS 7.0*  --  6.9*  --   --   --   --   --    < > = values in this interval not displayed.   Liver Function Tests: Recent Labs  Lab 10/16/18 1045 10/18/18 0334 10/19/18 1044  AST 17  --   --   ALT 13  --   --   ALKPHOS 58  --   --   BILITOT 1.1  --   --   PROT 7.0  --   --   ALBUMIN 3.9 3.4* 3.6   CBC: Recent Labs  Lab 10/18/18 0334 10/19/18 1045 10/20/18 0638 10/21/18 1411 10/22/18 0208 10/23/18 0432  WBC 3.9* 4.7 4.2  --  4.5 5.2   NEUTROABS  --   --  2.8  --   --   --   HGB 9.3* 10.0* 9.3* 9.9* 8.7* 8.4*  HCT 28.7* 30.6* 29.1* 29.0* 27.9* 26.9*  MCV 104.4* 104.4* 106.6*  --  109.4* 110.2*  PLT 59* 74* 64*  --  51* 44*   Blood Culture    Component Value Date/Time   SDES URINE, RANDOM 02/01/2016 1707   SPECREQUEST NONE 02/01/2016 1707   CULT (A) 02/01/2016 1707    10,000 COLONIES/mL PSEUDOMONAS AERUGINOSA 40,000 COLONIES/mL ENTEROBACTER AEROGENES    REPTSTATUS 02/04/2016 FINAL 02/01/2016 1707    Studies/Results: Dg Chest Port 1 View  Result Date: 10/21/2018 CLINICAL DATA:  Status post TAVR. EXAM: PORTABLE CHEST 1 VIEW COMPARISON:  Chest x-ray dated October 17, 2018. FINDINGS: New left internal jugular central venous catheter  with tip in the mid SVC. Interval TAVR. Stable mild cardiomegaly. Atherosclerotic calcification of the aortic arch. Normal pulmonary vascularity. No focal consolidation, pleural effusion, or pneumothorax. No acute osseous abnormality. IMPRESSION: 1. Interval TAVR.  No active disease. Electronically Signed   By: Titus Dubin M.D.   On: 10/21/2018 16:34   Medications: . sodium chloride    . nitroGLYCERIN    . phenylephrine (NEO-SYNEPHRINE) Adult infusion     . aspirin EC  81 mg Oral QPM  . atorvastatin  80 mg Oral Q supper  . Chlorhexidine Gluconate Cloth  6 each Topical Q0600  . clopidogrel  75 mg Oral Q supper  . gabapentin  300 mg Oral Daily  . midodrine  10 mg Oral Q M,W,F-HD  . multivitamin  1 tablet Oral QHS  . pantoprazole  40 mg Oral QPM  . sodium chloride flush  3 mL Intravenous Q12H  . sucroferric oxyhydroxide  1,000 mg Oral TID WC    Dialysis Orders: [facility] 4h 400/800 82.5kg 2/2 bath Hep 8000 RUE AVF Sensipar 180mg  PO TIW with dialysis Mircera 112mcg IV q2weeks (last given 10/15/2018) Calcitriol 1.5 mcg PO TIW velphoro 3 tabs PO TID AC and 2/snack  Recent labs: Hgb 10.4, Tsat 29; Ferritin 1303; Plt 62K; K+ 4.6; Albumin 4.1; calcium 9.4; PTH  615   Assessment/Plan: 1. Dyspnea:Improved s/p UF and TAVR, denies any SOF today. 2.  Severe AS: s/p TAVR 8/11. Per cardiology.   3. ESRD:MWF.   Completed 2 hours of HD yesterday, terminated early due to feeling "worn out." Improved today. Does not appear volume overloaded but is 4kg above his EDW by weights this AM.  4. Hypertension/volume:  BP controlled. Continue midodrine before dialysis. Over EDW by 0.5kg this AM, did not tolerate HAD well yesterday and only 400 mL removed. Continue to taper down volume with UF as tolerated.    5. Anemia:Hgb 9.3 > 8.7. Not due for ESA yet, if fails to improve will need to augment dose. Last iron level ok.  6. Metabolic bone disease:Calcium 9.1. Recent PTH elevated. Will continue sensipar and calcitriol. Phos 6.9- continue home binder. 7. Nutrition:Renal diet with fluid restrictions 8. Aortic stenosis:  TAVR 10/21/2018. Per cardiology 9. Thrombocytopenia: plt 44 this AM, is on aspirin and plavix. Has tolerated heparin with dialysis at outpatient unit, holding here.   Anice Paganini, PA-C 10/23/2018, 9:42 AM  [facility] Pager: [phone]

## 2018-10-23 NOTE — Progress Notes (Signed)
Pt continues to remain in Bigeminy now with more frequent runs of VTach (3-10 beat bursts). RR RN Waunita Schooner consulted and agreed with current plan of care. Will review AM labs once resulted and frequently monitor pt for symptoms (still presently asymptomatic).

## 2018-10-23 NOTE — Progress Notes (Signed)
Physical Therapy Treatment Patient Details Name: Jose Garrett MRN: [id number] DOB: 01-05-1951 Today's Date: 10/23/2018    History of Present Illness 68 yo admitted with dyspnea awaiting TAVR. PMHx: aortic stenosis, HTN, HLD, GERD, CHF, CAD, PAD, anxiety, ESRD on HD    PT Comments    Patient progressing with mobility and though not quite as stable with cane mobilizing well.  Describes worst symptom as sleepiness all the time.  Feel continues to be appropriate for skilled PT in the acute setting.    Follow Up Recommendations  No PT follow up     Equipment Recommendations  None recommended by PT    Recommendations for Other Services       Precautions / Restrictions Precautions Precautions: Fall Precaution Comments: watch sats    Mobility  Bed Mobility Overal bed mobility: Modified Independent                Transfers Overall transfer level: Needs assistance   Transfers: Sit to/from Stand Sit to Stand: Supervision         General transfer comment: supervision for safety and balance  Ambulation/Gait Ambulation/Gait assistance: Min guard Gait Distance (Feet): 250 Feet Assistive device: Straight cane Gait Pattern/deviations: Step-through pattern;Decreased stride length     General Gait Details: LOB x 1 with min A recovery, otherwise at times reaching for rail or minguard A for safety   Stairs             Wheelchair Mobility    Modified Rankin (Stroke Patients Only)       Balance Overall balance assessment: Needs assistance   Sitting balance-Leahy Scale: Good     Standing balance support: Single extremity supported;During functional activity                                Cognition Arousal/Alertness: Awake/alert Behavior During Therapy: WFL for tasks assessed/performed Overall Cognitive Status: Within Functional Limits for tasks assessed                                        Exercises      General  Comments General comments (skin integrity, edema, etc.): VSS ambulating on RA, SpO2 91%, HR 92      Pertinent Vitals/Pain Pain Assessment: No/denies pain    Home Living                      Prior Function            PT Goals (current goals can now be found in the care plan section) Progress towards PT goals: Progressing toward goals    Frequency    Min 2X/week      PT Plan Current plan remains appropriate    Co-evaluation              AM-PAC PT "6 Clicks" Mobility   Outcome Measure  Help needed turning from your back to your side while in a flat bed without using bedrails?: None Help needed moving from lying on your back to sitting on the side of a flat bed without using bedrails?: None Help needed moving to and from a bed to a chair (including a wheelchair)?: None Help needed standing up from a chair using your arms (e.g., wheelchair or bedside chair)?: None Help needed to walk in hospital room?: A Little  Help needed climbing 3-5 steps with a railing? : A Little 6 Click Score: 22    End of Session   Activity Tolerance: Patient tolerated treatment well Patient left: in chair;with call bell/phone within reach   PT Visit Diagnosis: Other abnormalities of gait and mobility (R26.89)     Time: 0383-3383 PT Time Calculation (min) (ACUTE ONLY): 20 min  Charges:  $Gait Training: 8-22 mins                     [name], [place] [phone] 10/23/2018    [name] 10/23/2018, 6:10 PM

## 2018-10-23 NOTE — Progress Notes (Signed)
CARDIAC REHAB PHASE I   PRE:  Rate/Rhythm: 89 Bigeminy  BP:  Sitting: 114/45      SaO2: 97 RA  MODE:  Ambulation: 140 ft   POST:  Rate/Rhythm: 104 Bigeminy with couplets  BP:  Sitting: 102/58    SaO2: 99 RA  Pt ambulated 154ft in hallway assist of 2 with cane. Pt with several standing rest breaks  c/o some SOB, but mostly due to back pain. Pt returned to recliner, call bell and phone within reach. Pt educated on restrictions, and site care. Encouraged ambulation as able with emphasis on safety. Pt declining CRP II at this time.   [id number] Rufina Falco, RN BSN 10/23/2018 10:17 AM

## 2018-10-23 NOTE — Progress Notes (Addendum)
[facility] VALVE TEAM  Patient Name: Jose Garrett Date of Encounter: 10/23/2018  Primary Cardiologist: Dr. Angelena Form Dr. Burt Knack & Dr. Roxy Manns (TAVR)   Hospital Problem List     Principal Problem:   S/P TAVR (transcatheter aortic valve replacement) Active Problems:   Acute on chronic combined systolic and diastolic CHF (congestive heart failure) ([place])   Essential hypertension, benign   AL amyloidosis (HCC)   Anemia of chronic disease   ESRD (end stage renal disease) on dialysis [facility])   PAD (peripheral artery disease) (HCC)   Thrombocytopenia (HCC)   Coronary artery disease involving native coronary artery of native heart without angina pectoris   Anxiety    Subjective   Doing okay. No complaints. Feels ready to go home.    Inpatient Medications    Scheduled Meds: . aspirin EC  81 mg Oral QPM  . atorvastatin  80 mg Oral Q supper  . Chlorhexidine Gluconate Cloth  6 each Topical Q0600  . clopidogrel  75 mg Oral Q supper  . gabapentin  300 mg Oral Daily  . midodrine  10 mg Oral Q M,W,F-HD  . multivitamin  1 tablet Oral QHS  . pantoprazole  40 mg Oral QPM  . sodium chloride flush  3 mL Intravenous Q12H  . sucroferric oxyhydroxide  1,000 mg Oral TID WC   Continuous Infusions: . sodium chloride    . nitroGLYCERIN    . phenylephrine (NEO-SYNEPHRINE) Adult infusion     PRN Meds: sodium chloride, acetaminophen **OR** acetaminophen, LORazepam, morphine injection, ondansetron (ZOFRAN) IV, oxyCODONE, sodium chloride flush, traMADol   Vital Signs    Vitals:   10/22/18 1912 10/22/18 2045 10/23/18 0017 10/23/18 0334  BP: 130/88 (!) 142/53 (!) 115/49 (!) 125/39  Pulse: 94 96 (!) 44 88  Resp: 19 20 17  (!) 22  Temp: 99.3 F (37.4 C)  97.8 F (36.6 C) 97.7 F (36.5 C)  TempSrc: Oral  Oral Oral  SpO2: 97% 93% 100% 92%  Weight:    86.5 kg  Height:        Intake/Output Summary (Last 24 hours) at 10/23/2018 0637 Last data filed at  10/23/2018 0300 Gross per 24 hour  Intake 626.77 ml  Output 400 ml  Net 226.77 ml   Filed Weights   10/22/18 0715 10/22/18 0930 10/23/18 0334  Weight: 84.7 kg 84.3 kg 86.5 kg    Physical Exam    GEN: chronically ill appearing  HEENT: Grossly normal.  Neck: Supple, no JVD, or masses. Cardiac: RRR, 2/6 SEM @ RUSB. No rubs, or gallops. No clubbing, cyanosis. 2+ LLE edema which is chronic. Radials/DP/PT 2+ and equal bilaterally.  Respiratory:  Respirations regular and unlabored, clear to auscultation bilaterally. GI: Soft, nontender, nondistended, BS + x 4. MS: no deformity or atrophy Skin: warm and dry, no rash. Right groin site with some mild ecchymosis under bandage. Left brachial site stable with some mild ecchymosis. Left groin seroma stable.  Neuro:  Strength and sensation are intact. Psych: AAOx3.  Normal affect.  Labs    CBC Recent Labs    10/20/18 0638  10/22/18 0208 10/23/18 0432  WBC 4.2  --  4.5 5.2  NEUTROABS 2.8  --   --   --   HGB 9.3*   < > 8.7* 8.4*  HCT 29.1*   < > 27.9* 26.9*  MCV 106.6*  --  109.4* 110.2*  PLT 64*  --  51* 44*   < > =  values in this interval not displayed.   Basic Metabolic Panel Recent Labs    10/22/18 0208 10/23/18 0432  NA 138 139  K 4.9 4.2  CL 98 99  CO2 27 27  GLUCOSE 101* 98  BUN 33* 30*  CREATININE 7.40* 7.19*  CALCIUM 8.6* 9.1  MG 1.7 2.2   Liver Function Tests No results for input(s): AST, ALT, ALKPHOS, BILITOT, PROT, ALBUMIN in the last 72 hours. No results for input(s): LIPASE, AMYLASE in the last 72 hours. Cardiac Enzymes No results for input(s): CKTOTAL, CKMB, CKMBINDEX, TROPONINI in the last 72 hours. BNP Invalid input(s): POCBNP D-Dimer No results for input(s): DDIMER in the last 72 hours. Hemoglobin A1C No results for input(s): HGBA1C in the last 72 hours. Fasting Lipid Panel No results for input(s): CHOL, HDL, LDLCALC, TRIG, CHOLHDL, LDLDIRECT in the last 72 hours. Thyroid Function Tests No results  for input(s): TSH, T4TOTAL, T3FREE, THYROIDAB in the last 72 hours.  Invalid input(s): FREET3  Telemetry    Sinus with ventricular bigeminy and short runs of NSVT - Personally Reviewed  ECG    Sinus tachy with HR 101, RBBB and ventricular bigeminy - Personally Reviewed  Radiology    Dg Chest Port 1 View  Result Date: 10/21/2018 CLINICAL DATA:  Status post TAVR. EXAM: PORTABLE CHEST 1 VIEW COMPARISON:  Chest x-ray dated October 17, 2018. FINDINGS: New left internal jugular central venous catheter with tip in the mid SVC. Interval TAVR. Stable mild cardiomegaly. Atherosclerotic calcification of the aortic arch. Normal pulmonary vascularity. No focal consolidation, pleural effusion, or pneumothorax. No acute osseous abnormality. IMPRESSION: 1. Interval TAVR.  No active disease. Electronically Signed   By: Titus Dubin M.D.   On: 10/21/2018 16:34    Cardiac Studies   TAVR OPERATIVE NOTE   Date of Procedure:                10/21/2018  Preoperative Diagnosis:      Severe Aortic Stenosis   Postoperative Diagnosis:    Same   Procedure:        Transcatheter Aortic Valve Replacement - Percutaneous Right Transfemoral Approach             Edwards Sapien 3 Ultra THV (size 26 mm, model # 9750TFX, serial # [id number])              Co-Surgeons:                        Valentina Gu. Roxy Manns, MD and Sherren Mocha, MD  Anesthesiologist:                  Nolon Nations, MD  Echocardiographer:              Jenkins Rouge, MD  Pre-operative Echo Findings: ? Severe aortic stenosis ? Moderate-severe left ventricular systolic dysfunction  Post-operative Echo Findings: ? No paravalvular leak ? Unchanged left ventricular systolic function  __________________   Echo 10/22/18:  IMPRESSIONS  1. The left ventricle has moderate-severely reduced systolic function, with an ejection fraction of 30-35%. The cavity size was normal. There is mildly increased left ventricular wall thickness. Left  ventricular diastolic Doppler parameters are indeterminate. Left ventricular diffuse hypokinesis.  2. The right ventricle has mildly reduced systolic function. The cavity was mildly enlarged.  3. Large pleural effusion in the left lateral region.  4. Small pericardial effusion.  5. The mitral valve is abnormal. Mild thickening of the mitral valve leaflet. There is mild  mitral annular calcification present.  6. The tricuspid valve is grossly normal.  7. The aorta is normal in size and structure.  8. The inferior vena cava was dilated in size with >50% respiratory variability.  9. Moderate to severe global reduction in LV systolic function; mild LVH; s/p TAVR with no AI and mean gradient 16 mmHg; mild RVE with mildly reduced function; small pericardial effusion.   Patient Profile     HRISTOPHER MISSILDINE is a 68 y.o. male with a history of of ESRD on HD, AL amyloid (kidney involvement), HTN, HLD, GERD, anemia, thrombocytopenia, ischemic cardiomyopathy, chronic combined CHF, CAD, carotid artery disease, PAD w/ left femoral to below the knee popliteal bypass with synthetic graft, s/p toe amputations, left groin seroma, and severe AS who presented to [facility] on 10/17/18 with dyspnea and failure to thrive. He was admitted for HD and monitoring until his planned TAVR on 10/21/18.  Assessment & Plan    Severe AS: s/p successful TAVR with a 26 mm Edwards Sapien Ultra THV via the R TF approach on 10/21/18. Post operative echo showed persistent moderate to severe LV systolic dysfunction (EF 56-38%) with normally functioning TAVR with no AI and mean gradient 16 mmHg. There was also mild RVE with mildly reduced function and a small pericardial effusion. Groin site/ brachial site are stable. ECG with old RBBB and no high grade heart block. Continue Asprin and plavix.   ESRD on HD (M,W,F): per nephrology. RN noted that patient did not feel well and self terminated HD yesterday 2 hours early AMA. We are hopeful he will  tolerate HD better now that his severe AS is fixed.   Acute on chronic combined S/D CHF: as evidenced by an extremely elevated BNP > 3000 on preadmission lab work and need to admission due to worsening dyspnea. Volume management per nephrology. Patient is anuric. GMDT limited by hypotension with HD.   CAD: pre TAVR cath showed severe single vessel CAD with chronic total occlusion of the RCA, collateralized with L--->R collaterals. Mild calcific nonobstructive LCA disease, anatomy unchanged from previous cath studies. No PCI targets. Continue medical therapy.   PAD: has an appointment with Dr. Donzetta Matters later this month to discuss R LE bypass and possible drainage of L groin seroma   Thrombocytopenia: this is chronic but platelets down to 44,000. Continue to monitor.   Ectopy: tele with ventricular bigeminy and frequent runs of NSVT. Electrolytes are normal. QTc okay on ECG. He has not been on any BB given hypotension with HD. Dr. Burt Knack reviewed telemetry and questions possible Torsades. Will start him on amiodarone 400 mg BID and place a LifeVest, hopefully can be placed tomorrow AM.   Ischemic cardiomyopathy: as above, we will have LifeVest placed.   SignedAngelena Form, PA-C  10/23/2018, 6:37 AM  Pager [phone]  Patient seen, examined. Available data reviewed. Agree with findings, assessment, and plan as outlined by Nell Range, PA-C.  The patient is independently interviewed and examined.  Lungs are clear, heart is regular rate and rhythm with a soft ejection murmur at the right upper sternal border, no diastolic murmur, abdomen is soft and nontender, right groin site is clear, extremities show improving edema in the left lower leg.  Telemetry is reviewed and demonstrates frequent PVCs in a pattern of bigeminy and are on T phenomena with short runs of polymorphic VT.  Normally would treat the patient with a beta-blocker, but his blood pressure is soft especially on dialysis days.  I think  the safest thing to do is start him on amiodarone 400 mg twice daily.  Will place a consult for a LifeVest while the patient is being fully loaded on amiodarone.  Considering his multiple comorbidities, an ICD may not be an option.  I have discussed his case and reviewed telemetry with EP colleagues who agrees that amiodarone and a LifeVest are appropriate.  Sherren Mocha, M.D. 10/23/2018 4:23 PM

## 2018-10-23 NOTE — Care Management (Signed)
10-23-18 Life Vest Order sent in via PA- CM did fax information to [employer] to see if patient is approved for Life Vest. No further needs from CM at this time. Bethena Roys, RN,BSN Case Manager [phone]

## 2018-10-23 NOTE — Progress Notes (Signed)
RN Cassie aware of the DC central line order and plans to remove line

## 2018-10-23 NOTE — Discharge Summary (Deleted)
Jose Garrett  Discharge Summary    Patient ID: Jose Garrett MRN: [id number]; DOB: June 09, 1950  Admit date: 10/17/2018 Discharge date: 10/23/2018  Primary Care Provider: [name], MD  Primary Cardiologist: Dr. Angelena Form Dr. Burt Knack & Dr. Roxy Manns (TAVR)  Discharge Diagnoses    Principal Problem:   S/P TAVR (transcatheter aortic valve replacement) Active Problems:   Acute on chronic combined systolic and diastolic CHF (congestive heart failure) ([place])   Essential hypertension, benign   AL amyloidosis (HCC)   Anemia of chronic disease   ESRD (end stage renal disease) on dialysis ([place])   PAD (peripheral artery disease) (HCC)   Thrombocytopenia (HCC)   Coronary artery disease involving native coronary artery of native heart without angina pectoris   Anxiety   Allergies No Known Allergies  Diagnostic Studies/Procedures    TAVR OPERATIVE NOTE   Date of Procedure:10/21/2018  Preoperative Diagnosis:Severe Aortic Stenosis   Postoperative Diagnosis:Same   Procedure:   Transcatheter Aortic Valve Replacement - PercutaneousRightTransfemoral Approach Edwards Sapien 3 UltraTHV (size58mm, model # [id number], serial [id number])  Co-Surgeons:Clarence H. Roxy Manns, MD and Sherren Mocha, MD  Anesthesiologist:John Lissa Hoard, MD  Echocardiographer:Peter Johnsie Cancel, MD  Pre-operative Echo Findings: ? Severe aortic stenosis ? Moderate-severeleft ventricular systolic dysfunction  Post-operative Echo Findings: ? Noparavalvular leak ? Unchangedleft ventricular systolic function  __________________   Echo 10/22/18:  IMPRESSIONS 1. The left ventricle has moderate-severely reduced systolic function, with an ejection fraction of 30-35%. The cavity size was normal. There is mildly increased left ventricular wall  thickness. Left ventricular diastolic Doppler parameters are indeterminate. Left ventricular diffuse hypokinesis. 2. The right ventricle has mildly reduced systolic function. The cavity was mildly enlarged. 3. Large pleural effusion in the left lateral region. 4. Small pericardial effusion. 5. The mitral valve is abnormal. Mild thickening of the mitral valve leaflet. There is mild mitral annular calcification present. 6. The tricuspid valve is grossly normal. 7. The aorta is normal in size and structure. 8. The inferior vena cava was dilated in size with >50% respiratory variability. 9. Moderate to severe global reduction in LV systolic function; mild LVH; s/p TAVR with no AI and mean gradient 16 mmHg; mild RVE with mildly reduced function; small pericardial effusion.     History of Present Illness     Jose Garrett is a 68 y.o. male with a history of of ESRD on HD, AL amyloid (kidney involvement), HTN, HLD, GERD, anemia, thrombocytopenia, ischemic cardiomyopathy, chronic combined CHF, CAD, RBBB, carotid artery disease, PAD w/ left femoral to below the knee popliteal bypass with synthetic graft, s/p toe amputations, left groin seroma, and severe AS who presented to [facility] on 10/17/18 with dyspnea and failure to thrive. He was admitted for HD and monitoring until his planned TAVR on 10/21/18.  Patient's cardiac history dates back to 11/2017 when he was hospitalized with acute respiratory failure in the setting of hypertensive urgency.  Troponin levels were elevated and EKG revealed diffuse ST segment depression.  Cardiac catheterization revealed occlusion of the right coronary artery with nonobstructive disease in the left coronary territory.  The right coronary artery could not be engaged.  He was treated medically. Follow-up echocardiogram performed 12/19/17 revealed normal left ventricular systolic function with moderate concentric hypertrophy and findings worrisome for possible amyloid  infiltration. The aoritc valve was felt to be functionally bicuspid with moderate aortic stenosis.  Patient remained stable until January of this year when he was hospitalized again with an exacerbation of congestive heart  failure.  Echocardiogram performed at that time revealed what was felt to be significant drop in left ventricular ejection fraction to 45%.  Transvalvular gradient across the aortic valve was unchanged.  The patient was seen in follow-up by Dr. Angelena Form in February of this year but plans for further evaluation to consider TAVR were delayed because of COVID-19. In May the patient was hospitalized with critical limb ischemia involving his left lower extremity with 2 gangrenous toes. He was found to have bilateral chronic occlusion of the superficial femoral arteries as well as the left popliteal artery.  He underwent left common femoral to below-knee popliteal artery bypass graft using synthetic Gore-Tex graft. Postoperatively, he suffered an acute exacerbation of chronic combined systolic and diastolic congestive heart failure with mildly positive troponins consistent with NSTEMI.  Catheterization revealed no significant change in his coronary artery disease with chronic occlusion of the right coronary artery and no significant disease in the left coronary circulation.  Echocardiogram revealed further drop in left ventricular systolic function with ejection fraction estimated 35 to 40%.  There was at least moderate and probably severe aortic stenosis.  Peak velocity across aortic valve measured as high as 3.7 m/s corresponding to mean transvalvular gradient estimated 30 mmHg and aortic valve area calculated 0.87 cm.  The DVI was notably only 0.20.  His subsequent recovery was slow and he later underwent amputation of his gangrenous toes.  He had a prolonged recovery requiring inpatient rehab.  He developed a large seroma in the left groin associated with his surgical incision which has been  treated conservatively.  More recently he has been having problems tolerating dialysis with tendency to get hypotension towards the completion of dialysis treatments.   The patient has been evaluated by the multidisciplinary valve Garrett and felt to have severe, symptomatic aortic stenosis and to be a suitable candidate for TAVR, which was set up for 10/21/18. However, the patient developed worsening dyspnea and presented to the hospital on 10/17/18 for evaluation and was admitted until TAVR.   Hospital Course     Consultants: none  Severe AS:s/p successful TAVR with a 26 mm Edwards Sapien Ultra THV via the R TF approach on 10/21/18. Post operative echo showed persistent moderate to severe LV systolic dysfunction (EF 22-29%) with normally functioning TAVR with no AI and mean gradient 16 mmHg. There was also mild RVE with mildly reduced function and a small pericardial effusion. Groin site/ brachial site are stable. ECG with old RBBB and no high grade heart block. Continue Asprin and plavix. Plan for DC home today with follow up in office next week.   ESRD on HD (M,W,F): per nephrology. RN noted that patient did not feel well and self terminated HD yesterday 2 hours early AMA. We are hopeful he will tolerate HD better now that his severe AS is fixed.   Acute on chronic combined S/D CHF: as evidenced by an extremely elevated BNP > 3000 on preadmission lab work and need to admission due to worsening dyspnea. Volume management per nephrology. Patient is anuric. GMDT limited by hypotension with HD.   CAD: pre TAVR cath showed severe single vessel CAD with chronic total occlusion of the RCA, collateralized with L--->R collaterals. Mild calcific nonobstructive LCA disease, anatomy unchanged from previous cath studies. No PCI targets. Continue medical therapy.   PAD: has an appointment with Dr. Donzetta Matters later this month to discuss R LE bypass and possible drainage of L groin seroma   Thrombocytopenia: this is  chronic but  platelets down to 44,000. Continue to monitor. CBC next week.   Ectopy: tele with ventricular bigeminy and frequent runs of NSVT. Electrolytes are normal. QTc okay on ECG. He has not been on any BB or antihypertensives given hypotension with HD.   Dispo: likely home today with daughter. 1 week TOC apt next week.  _____________  Discharge Vitals Blood pressure (!) 116/44, pulse 85, temperature 97.8 F (36.6 C), temperature source Oral, resp. rate 18, height 5\' 10"  (1.778 m), weight 86.5 kg, SpO2 95 %.  Filed Weights   10/22/18 0715 10/22/18 0930 10/23/18 0334  Weight: 84.7 kg 84.3 kg 86.5 kg    Labs & Radiologic Studies    CBC Recent Labs    10/22/18 0208 10/23/18 0432  WBC 4.5 5.2  HGB 8.7* 8.4*  HCT 27.9* 26.9*  MCV 109.4* 110.2*  PLT 51* 44*   Basic Metabolic Panel Recent Labs    10/22/18 0208 10/23/18 0432  NA 138 139  K 4.9 4.2  CL 98 99  CO2 27 27  GLUCOSE 101* 98  BUN 33* 30*  CREATININE 7.40* 7.19*  CALCIUM 8.6* 9.1  MG 1.7 2.2   Liver Function Tests No results for input(s): AST, ALT, ALKPHOS, BILITOT, PROT, ALBUMIN in the last 72 hours. No results for input(s): LIPASE, AMYLASE in the last 72 hours. Cardiac Enzymes No results for input(s): CKTOTAL, CKMB, CKMBINDEX, TROPONINI in the last 72 hours. BNP Invalid input(s): POCBNP D-Dimer No results for input(s): DDIMER in the last 72 hours. Hemoglobin A1C No results for input(s): HGBA1C in the last 72 hours. Fasting Lipid Panel No results for input(s): CHOL, HDL, LDLCALC, TRIG, CHOLHDL, LDLDIRECT in the last 72 hours. Thyroid Function Tests No results for input(s): TSH, T4TOTAL, T3FREE, THYROIDAB in the last 72 hours.  Invalid input(s): FREET3 _____________  Dg Chest 2 View  Result Date: 10/16/2018 CLINICAL DATA:  Valve replacement shortness of breath with exertion EXAM: CHEST - 2 VIEW COMPARISON:  07/23/2018 FINDINGS: Mild cardiomegaly with prominent central pulmonary vessels. Tiny pleural  effusions. No focal consolidation. Aortic atherosclerosis. No pneumothorax. IMPRESSION: 1. Cardiomegaly with tiny pleural effusions. Electronically Signed   By: Donavan Foil M.D.   On: 10/16/2018 15:39   Ct Coronary Morph W/cta Cor W/score W/ca W/cm &/or Wo/cm  Addendum Date: 09/30/2018   ADDENDUM REPORT: 09/30/2018 14:13 EXAM: OVER-READ INTERPRETATION  CT CHEST The following report is an over-read performed by radiologist Dr. Rebekah [facility]field [facility] on 09/30/2018. This over-read does not include interpretation of cardiac or coronary anatomy or pathology. The coronary calcium score or cardiac CTA interpretation by the cardiologist is attached. COMPARISON:  None. FINDINGS: Extracardiac findings will be described under separate dictation for contemporaneously obtained CTA chest, abdomen and pelvis. IMPRESSION: Please see separate dictation for contemporaneously obtained CTA chest, abdomen and pelvis dated 09/30/2018 for full description of relevant extracardiac findings. Electronically Signed   By: Vinnie Langton M.D.   On: 09/30/2018 14:13   Result Date: 09/30/2018 CLINICAL DATA:  68 year old male with severe aortic stenosis being evaluated for a TAVR procedure. EXAM: Cardiac TAVR CT TECHNIQUE: The patient was scanned on a [employer]. A 120 kV retrospective scan was triggered in the descending thoracic aorta at 111 HU's. Gantry rotation speed was 250 msecs and collimation was .6 mm. No beta blockade or nitro were given. The 3D data set was reconstructed in 5% intervals of the R-R cycle. Systolic and diastolic phases were analyzed on a dedicated work station using MPR, MIP and VRT  modes. The patient received 80 cc of contrast. FINDINGS: Aortic Valve: Trileaflet aortic valve with severely thickened and calcified leaflets with severely restricted leaflet opening and no calcifications extending into the LVOT. Aorta: Normal size, mild diffuse atherosclerotic plaque and calcifications  and no dissection. Sinotubular Junction: 31 x 29 mm Ascending Thoracic Aorta: 31 x 30 mm Aortic Arch: 28 x 26 mm Descending Thoracic Aorta: 23 x 22 mm Sinus of Valsalva Measurements: Non-coronary: 34 mm Right -coronary: 33 mm Left -coronary: 35 mm Coronary Artery Height above Annulus: Left Main: 12 mm Right Coronary: 19 mm (RCA occluded) Virtual Basal Annulus Measurements: Maximum/Minimum Diameter: 28.6 x 24.1 mm Mean Diameter: 25.4 mm Perimeter: 81.5 mm Area: 508 mm2 Optimum Fluoroscopic Angle for Delivery: LAO 6 CAU 7. IMPRESSION: 1. Trileaflet aortic valve with severely thickened and calcified leaflets with severely restricted leaflet opening and no calcifications extending into the LVOT. Aortic valve calcium score 2859 consistent with severe aortic stenosis. Annular measurements suitable for delivery of a 26 mm Edwards-SAPIEN 3 valve. 2. Sufficient coronary to annulus distance. 3. Optimum Fluoroscopic Angle for Delivery:  LAO 6 CAU 7. 4. No thrombus in the left atrial appendage. Electronically Signed: By: Ena Dawley On: 09/30/2018 12:10   Dg Chest Port 1 View  Result Date: 10/21/2018 CLINICAL DATA:  Status post TAVR. EXAM: PORTABLE CHEST 1 VIEW COMPARISON:  Chest x-ray dated October 17, 2018. FINDINGS: New left internal jugular central venous catheter with tip in the mid SVC. Interval TAVR. Stable mild cardiomegaly. Atherosclerotic calcification of the aortic arch. Normal pulmonary vascularity. No focal consolidation, pleural effusion, or pneumothorax. No acute osseous abnormality. IMPRESSION: 1. Interval TAVR.  No active disease. Electronically Signed   By: Titus Dubin M.D.   On: 10/21/2018 16:34   Dg Chest Portable 1 View  Result Date: 10/17/2018 CLINICAL DATA:  Intermittent chest pain with exertion. EXAM: PORTABLE CHEST 1 VIEW COMPARISON:  October 16, 2018 FINDINGS: No pneumothorax. Stable cardiomegaly. The hila and mediastinum are unremarkable. No pulmonary nodules or masses. No focal  infiltrates. No overt edema. Mild pulmonary venous congestion not excluded. IMPRESSION: Cardiomegaly. Suspected mild pulmonary venous congestion. No other abnormalities. Electronically Signed   By: Dorise Bullion III M.D   On: 10/17/2018 16:52   Ct Angio Chest Aorta W &/or Wo Contrast  Result Date: 09/30/2018 CLINICAL DATA:  68 year old male with history of severe aortic stenosis. Preprocedural study prior to potential transcatheter aortic valve replacement (TAVR) procedure. EXAM: CT ANGIOGRAPHY CHEST, ABDOMEN AND PELVIS TECHNIQUE: Multidetector CT imaging through the chest, abdomen and pelvis was performed using the standard protocol during bolus administration of intravenous contrast. Multiplanar reconstructed images and MIPs were obtained and reviewed to evaluate the vascular anatomy. CONTRAST:  186mL OMNIPAQUE IOHEXOL 350 MG/ML SOLN COMPARISON:  PET-CT 10/14/2014. CT the abdomen and pelvis 09/08/2014. FINDINGS: CTA CHEST FINDINGS Cardiovascular: Heart size is enlarged with moderate left ventricular dilatation. There is no significant pericardial fluid, thickening or pericardial calcification. There is aortic atherosclerosis, as well as atherosclerosis of the great vessels of the mediastinum and the coronary arteries, including calcified atherosclerotic plaque in the left main, left anterior descending, left circumflex and right coronary arteries. Severe thickening calcification of the aortic valve. Mild calcifications of the mitral annulus. Mediastinum/Lymph Nodes: No pathologically enlarged mediastinal or hilar lymph nodes. Esophagus is unremarkable in appearance. No axillary lymphadenopathy. Lungs/Pleura: No suspicious appearing pulmonary nodules or masses are noted. No acute consolidative airspace disease. Moderate bilateral pleural effusions with some mild dependent subsegmental atelectasis. Mild diffuse bronchial wall  thickening with mild centrilobular and paraseptal emphysema. Musculoskeletal/Soft  Tissues: There are no aggressive appearing lytic or blastic lesions noted in the visualized portions of the skeleton. CTA ABDOMEN AND PELVIS FINDINGS Hepatobiliary: 2.3 cm low-attenuation lesion in segment 7 of the liver, increased in size compared to remote prior study from 2016, compatible with a slow early growing simple cyst. No other suspicious cystic or solid hepatic lesions. No intra or extrahepatic biliary ductal dilatation. Status post cholecystectomy. Pancreas: No pancreatic mass. No pancreatic ductal dilatation. No pancreatic or peripancreatic fluid or inflammatory changes. Spleen: Unremarkable. Adrenals/Urinary Tract: Moderate bilateral renal atrophy. Numerous subcentimeter low-attenuation lesions in the kidneys bilaterally, too small to characterize, but statistically likely to represent tiny cysts. Bilateral adrenal glands are normal in appearance. No hydroureteronephrosis. Urinary bladder is normal in appearance. Stomach/Bowel: Normal appearance of the stomach. No pathologic dilatation of small bowel or colon. Numerous colonic diverticulae are noted, particularly in the sigmoid colon, without surrounding inflammatory changes to suggest an acute diverticulitis at this time. Normal appendix. Vascular/Lymphatic: Aortic atherosclerosis with vascular findings and measurements pertinent to potential TAVR procedure, as detailed below. Extending from the medial aspect of the left common femoral artery distally, immediately adjacent to the bifurcation, there is a 1.7 x 0.8 x 0.7 cm focal outpouching (axial image 199 of series 16 and coronal image 51 of series 18), compatible with a pseudoaneurysm. Anterior to the superficial femoral artery (axial image 204 of series 16) there is a large 5.6 x 7.6 x 8.1 cm low-intermediate attenuation lesion, likely to represent a resolving hematoma. Multiple prominent borderline enlarged retroperitoneal lymph nodes, nonspecific. Reproductive: Prostate gland and seminal  vesicles are unremarkable in appearance. Other: No significant volume of ascites.  No pneumoperitoneum. Musculoskeletal: There are no aggressive appearing lytic or blastic lesions noted in the visualized portions of the skeleton. VASCULAR MEASUREMENTS PERTINENT TO TAVR: AORTA: Minimal Aortic Diameter-11 x 14 mm Severity of Aortic Calcification-severe RIGHT PELVIS: Right Common Iliac Artery - Minimal Diameter-6.6 x 5.2 mm Tortuosity - mild Calcification-severe Right External Iliac Artery - Minimal Diameter-5.4 x 5.9 mm Tortuosity - mild Calcification-moderate Right Common Femoral Artery - Minimal Diameter-6.1 x 5.3 mm Tortuosity - mild Calcification-moderate LEFT PELVIS: Left Common Iliac Artery - Minimal Diameter-7.0 x 7.0 mm Tortuosity - mild Calcification-severe Left External Iliac Artery - Minimal Diameter-7.5 x 6.7 mm Tortuosity - mild Calcification-mild Left Common Femoral Artery - Minimal Diameter-8.3 x 6.6 mm Tortuosity-mild Calcification-mild-to-moderate Review of the MIP images confirms the above findings. IMPRESSION: 1. Vascular findings and measurements pertinent to potential TAVR procedure, as detailed above. 2. Large low-intermediate attenuation fluid collection in the left inguinal region likely to represent a resolving hematoma. There is also a small pseudoaneurysm extending off the medial aspect of the distal left common femoral artery, as above. 3. Severe thickening calcification of the aortic valve, compatible with the reported clinical history of severe aortic stenosis. 4. Cardiomegaly with moderate left ventricular dilatation. 5. Aortic atherosclerosis, in addition to left main and 3 vessel coronary artery disease. Please note that although the presence of coronary artery calcium documents the presence of coronary artery disease, the severity of this disease and any potential stenosis cannot be assessed on this non-gated CT examination. Assessment for potential risk factor modification, dietary  therapy or pharmacologic therapy may be warranted, if clinically indicated. 6. Moderate bilateral renal atrophy. 7. Colonic diverticulosis without evidence of acute diverticulitis at this time. 8. Additional incidental findings, as above. Electronically Signed   By: Vinnie Langton M.D.   On:  09/30/2018 14:13   Ct Angio Chest Aorta W &/or Wo Contrast  Result Date: 09/30/2018 CLINICAL DATA:  68 year old male with history of severe aortic stenosis. Preprocedural study prior to potential transcatheter aortic valve replacement (TAVR) procedure. EXAM: CT ANGIOGRAPHY CHEST, ABDOMEN AND PELVIS TECHNIQUE: Multidetector CT imaging through the chest, abdomen and pelvis was performed using the standard protocol during bolus administration of intravenous contrast. Multiplanar reconstructed images and MIPs were obtained and reviewed to evaluate the vascular anatomy. CONTRAST:  138mL OMNIPAQUE IOHEXOL 350 MG/ML SOLN COMPARISON:  PET-CT 10/14/2014. CT the abdomen and pelvis 09/08/2014. FINDINGS: CTA CHEST FINDINGS Cardiovascular: Heart size is enlarged with moderate left ventricular dilatation. There is no significant pericardial fluid, thickening or pericardial calcification. There is aortic atherosclerosis, as well as atherosclerosis of the great vessels of the mediastinum and the coronary arteries, including calcified atherosclerotic plaque in the left main, left anterior descending, left circumflex and right coronary arteries. Severe thickening calcification of the aortic valve. Mild calcifications of the mitral annulus. Mediastinum/Lymph Nodes: No pathologically enlarged mediastinal or hilar lymph nodes. Esophagus is unremarkable in appearance. No axillary lymphadenopathy. Lungs/Pleura: No suspicious appearing pulmonary nodules or masses are noted. No acute consolidative airspace disease. Moderate bilateral pleural effusions with some mild dependent subsegmental atelectasis. Mild diffuse bronchial wall thickening with  mild centrilobular and paraseptal emphysema. Musculoskeletal/Soft Tissues: There are no aggressive appearing lytic or blastic lesions noted in the visualized portions of the skeleton. CTA ABDOMEN AND PELVIS FINDINGS Hepatobiliary: 2.3 cm low-attenuation lesion in segment 7 of the liver, increased in size compared to remote prior study from 2016, compatible with a slow early growing simple cyst. No other suspicious cystic or solid hepatic lesions. No intra or extrahepatic biliary ductal dilatation. Status post cholecystectomy. Pancreas: No pancreatic mass. No pancreatic ductal dilatation. No pancreatic or peripancreatic fluid or inflammatory changes. Spleen: Unremarkable. Adrenals/Urinary Tract: Moderate bilateral renal atrophy. Numerous subcentimeter low-attenuation lesions in the kidneys bilaterally, too small to characterize, but statistically likely to represent tiny cysts. Bilateral adrenal glands are normal in appearance. No hydroureteronephrosis. Urinary bladder is normal in appearance. Stomach/Bowel: Normal appearance of the stomach. No pathologic dilatation of small bowel or colon. Numerous colonic diverticulae are noted, particularly in the sigmoid colon, without surrounding inflammatory changes to suggest an acute diverticulitis at this time. Normal appendix. Vascular/Lymphatic: Aortic atherosclerosis with vascular findings and measurements pertinent to potential TAVR procedure, as detailed below. Extending from the medial aspect of the left common femoral artery distally, immediately adjacent to the bifurcation, there is a 1.7 x 0.8 x 0.7 cm focal outpouching (axial image 199 of series 16 and coronal image 51 of series 18), compatible with a pseudoaneurysm. Anterior to the superficial femoral artery (axial image 204 of series 16) there is a large 5.6 x 7.6 x 8.1 cm low-intermediate attenuation lesion, likely to represent a resolving hematoma. Multiple prominent borderline enlarged retroperitoneal lymph  nodes, nonspecific. Reproductive: Prostate gland and seminal vesicles are unremarkable in appearance. Other: No significant volume of ascites.  No pneumoperitoneum. Musculoskeletal: There are no aggressive appearing lytic or blastic lesions noted in the visualized portions of the skeleton. VASCULAR MEASUREMENTS PERTINENT TO TAVR: AORTA: Minimal Aortic Diameter-11 x 14 mm Severity of Aortic Calcification-severe RIGHT PELVIS: Right Common Iliac Artery - Minimal Diameter-6.6 x 5.2 mm Tortuosity - mild Calcification-severe Right External Iliac Artery - Minimal Diameter-5.4 x 5.9 mm Tortuosity - mild Calcification-moderate Right Common Femoral Artery - Minimal Diameter-6.1 x 5.3 mm Tortuosity - mild Calcification-moderate LEFT PELVIS: Left Common Iliac Artery - Minimal  Diameter-7.0 x 7.0 mm Tortuosity - mild Calcification-severe Left External Iliac Artery - Minimal Diameter-7.5 x 6.7 mm Tortuosity - mild Calcification-mild Left Common Femoral Artery - Minimal Diameter-8.3 x 6.6 mm Tortuosity-mild Calcification-mild-to-moderate Review of the MIP images confirms the above findings. IMPRESSION: 1. Vascular findings and measurements pertinent to potential TAVR procedure, as detailed above. 2. Large low-intermediate attenuation fluid collection in the left inguinal region likely to represent a resolving hematoma. There is also a small pseudoaneurysm extending off the medial aspect of the distal left common femoral artery, as above. 3. Severe thickening calcification of the aortic valve, compatible with the reported clinical history of severe aortic stenosis. 4. Cardiomegaly with moderate left ventricular dilatation. 5. Aortic atherosclerosis, in addition to left main and 3 vessel coronary artery disease. Please note that although the presence of coronary artery calcium documents the presence of coronary artery disease, the severity of this disease and any potential stenosis cannot be assessed on this non-gated CT examination.  Assessment for potential risk factor modification, dietary therapy or pharmacologic therapy may be warranted, if clinically indicated. 6. Moderate bilateral renal atrophy. 7. Colonic diverticulosis without evidence of acute diverticulitis at this time. 8. Additional incidental findings, as above. Electronically Signed   By: Vinnie Langton M.D.   On: 09/30/2018 14:13   Ct Angio Abd/pel W/ And/or W/o  Result Date: 09/30/2018 CLINICAL DATA:  68 year old male with history of severe aortic stenosis. Preprocedural study prior to potential transcatheter aortic valve replacement (TAVR) procedure. EXAM: CT ANGIOGRAPHY CHEST, ABDOMEN AND PELVIS TECHNIQUE: Multidetector CT imaging through the chest, abdomen and pelvis was performed using the standard protocol during bolus administration of intravenous contrast. Multiplanar reconstructed images and MIPs were obtained and reviewed to evaluate the vascular anatomy. CONTRAST:  187mL OMNIPAQUE IOHEXOL 350 MG/ML SOLN COMPARISON:  PET-CT 10/14/2014. CT the abdomen and pelvis 09/08/2014. FINDINGS: CTA CHEST FINDINGS Cardiovascular: Heart size is enlarged with moderate left ventricular dilatation. There is no significant pericardial fluid, thickening or pericardial calcification. There is aortic atherosclerosis, as well as atherosclerosis of the great vessels of the mediastinum and the coronary arteries, including calcified atherosclerotic plaque in the left main, left anterior descending, left circumflex and right coronary arteries. Severe thickening calcification of the aortic valve. Mild calcifications of the mitral annulus. Mediastinum/Lymph Nodes: No pathologically enlarged mediastinal or hilar lymph nodes. Esophagus is unremarkable in appearance. No axillary lymphadenopathy. Lungs/Pleura: No suspicious appearing pulmonary nodules or masses are noted. No acute consolidative airspace disease. Moderate bilateral pleural effusions with some mild dependent subsegmental  atelectasis. Mild diffuse bronchial wall thickening with mild centrilobular and paraseptal emphysema. Musculoskeletal/Soft Tissues: There are no aggressive appearing lytic or blastic lesions noted in the visualized portions of the skeleton. CTA ABDOMEN AND PELVIS FINDINGS Hepatobiliary: 2.3 cm low-attenuation lesion in segment 7 of the liver, increased in size compared to remote prior study from 2016, compatible with a slow early growing simple cyst. No other suspicious cystic or solid hepatic lesions. No intra or extrahepatic biliary ductal dilatation. Status post cholecystectomy. Pancreas: No pancreatic mass. No pancreatic ductal dilatation. No pancreatic or peripancreatic fluid or inflammatory changes. Spleen: Unremarkable. Adrenals/Urinary Tract: Moderate bilateral renal atrophy. Numerous subcentimeter low-attenuation lesions in the kidneys bilaterally, too small to characterize, but statistically likely to represent tiny cysts. Bilateral adrenal glands are normal in appearance. No hydroureteronephrosis. Urinary bladder is normal in appearance. Stomach/Bowel: Normal appearance of the stomach. No pathologic dilatation of small bowel or colon. Numerous colonic diverticulae are noted, particularly in the sigmoid colon, without surrounding inflammatory  changes to suggest an acute diverticulitis at this time. Normal appendix. Vascular/Lymphatic: Aortic atherosclerosis with vascular findings and measurements pertinent to potential TAVR procedure, as detailed below. Extending from the medial aspect of the left common femoral artery distally, immediately adjacent to the bifurcation, there is a 1.7 x 0.8 x 0.7 cm focal outpouching (axial image 199 of series 16 and coronal image 51 of series 18), compatible with a pseudoaneurysm. Anterior to the superficial femoral artery (axial image 204 of series 16) there is a large 5.6 x 7.6 x 8.1 cm low-intermediate attenuation lesion, likely to represent a resolving hematoma.  Multiple prominent borderline enlarged retroperitoneal lymph nodes, nonspecific. Reproductive: Prostate gland and seminal vesicles are unremarkable in appearance. Other: No significant volume of ascites.  No pneumoperitoneum. Musculoskeletal: There are no aggressive appearing lytic or blastic lesions noted in the visualized portions of the skeleton. VASCULAR MEASUREMENTS PERTINENT TO TAVR: AORTA: Minimal Aortic Diameter-11 x 14 mm Severity of Aortic Calcification-severe RIGHT PELVIS: Right Common Iliac Artery - Minimal Diameter-6.6 x 5.2 mm Tortuosity - mild Calcification-severe Right External Iliac Artery - Minimal Diameter-5.4 x 5.9 mm Tortuosity - mild Calcification-moderate Right Common Femoral Artery - Minimal Diameter-6.1 x 5.3 mm Tortuosity - mild Calcification-moderate LEFT PELVIS: Left Common Iliac Artery - Minimal Diameter-7.0 x 7.0 mm Tortuosity - mild Calcification-severe Left External Iliac Artery - Minimal Diameter-7.5 x 6.7 mm Tortuosity - mild Calcification-mild Left Common Femoral Artery - Minimal Diameter-8.3 x 6.6 mm Tortuosity-mild Calcification-mild-to-moderate Review of the MIP images confirms the above findings. IMPRESSION: 1. Vascular findings and measurements pertinent to potential TAVR procedure, as detailed above. 2. Large low-intermediate attenuation fluid collection in the left inguinal region likely to represent a resolving hematoma. There is also a small pseudoaneurysm extending off the medial aspect of the distal left common femoral artery, as above. 3. Severe thickening calcification of the aortic valve, compatible with the reported clinical history of severe aortic stenosis. 4. Cardiomegaly with moderate left ventricular dilatation. 5. Aortic atherosclerosis, in addition to left main and 3 vessel coronary artery disease. Please note that although the presence of coronary artery calcium documents the presence of coronary artery disease, the severity of this disease and any potential  stenosis cannot be assessed on this non-gated CT examination. Assessment for potential risk factor modification, dietary therapy or pharmacologic therapy may be warranted, if clinically indicated. 6. Moderate bilateral renal atrophy. 7. Colonic diverticulosis without evidence of acute diverticulitis at this time. 8. Additional incidental findings, as above. Electronically Signed   By: Vinnie Langton M.D.   On: 09/30/2018 14:13   Ct Angio Abdomen Pelvis  W &/or Wo Contrast  Result Date: 09/30/2018 CLINICAL DATA:  68 year old male with history of severe aortic stenosis. Preprocedural study prior to potential transcatheter aortic valve replacement (TAVR) procedure. EXAM: CT ANGIOGRAPHY CHEST, ABDOMEN AND PELVIS TECHNIQUE: Multidetector CT imaging through the chest, abdomen and pelvis was performed using the standard protocol during bolus administration of intravenous contrast. Multiplanar reconstructed images and MIPs were obtained and reviewed to evaluate the vascular anatomy. CONTRAST:  151mL OMNIPAQUE IOHEXOL 350 MG/ML SOLN COMPARISON:  PET-CT 10/14/2014. CT the abdomen and pelvis 09/08/2014. FINDINGS: CTA CHEST FINDINGS Cardiovascular: Heart size is enlarged with moderate left ventricular dilatation. There is no significant pericardial fluid, thickening or pericardial calcification. There is aortic atherosclerosis, as well as atherosclerosis of the great vessels of the mediastinum and the coronary arteries, including calcified atherosclerotic plaque in the left main, left anterior descending, left circumflex and right coronary arteries. Severe thickening calcification of  the aortic valve. Mild calcifications of the mitral annulus. Mediastinum/Lymph Nodes: No pathologically enlarged mediastinal or hilar lymph nodes. Esophagus is unremarkable in appearance. No axillary lymphadenopathy. Lungs/Pleura: No suspicious appearing pulmonary nodules or masses are noted. No acute consolidative airspace disease. Moderate  bilateral pleural effusions with some mild dependent subsegmental atelectasis. Mild diffuse bronchial wall thickening with mild centrilobular and paraseptal emphysema. Musculoskeletal/Soft Tissues: There are no aggressive appearing lytic or blastic lesions noted in the visualized portions of the skeleton. CTA ABDOMEN AND PELVIS FINDINGS Hepatobiliary: 2.3 cm low-attenuation lesion in segment 7 of the liver, increased in size compared to remote prior study from 2016, compatible with a slow early growing simple cyst. No other suspicious cystic or solid hepatic lesions. No intra or extrahepatic biliary ductal dilatation. Status post cholecystectomy. Pancreas: No pancreatic mass. No pancreatic ductal dilatation. No pancreatic or peripancreatic fluid or inflammatory changes. Spleen: Unremarkable. Adrenals/Urinary Tract: Moderate bilateral renal atrophy. Numerous subcentimeter low-attenuation lesions in the kidneys bilaterally, too small to characterize, but statistically likely to represent tiny cysts. Bilateral adrenal glands are normal in appearance. No hydroureteronephrosis. Urinary bladder is normal in appearance. Stomach/Bowel: Normal appearance of the stomach. No pathologic dilatation of small bowel or colon. Numerous colonic diverticulae are noted, particularly in the sigmoid colon, without surrounding inflammatory changes to suggest an acute diverticulitis at this time. Normal appendix. Vascular/Lymphatic: Aortic atherosclerosis with vascular findings and measurements pertinent to potential TAVR procedure, as detailed below. Extending from the medial aspect of the left common femoral artery distally, immediately adjacent to the bifurcation, there is a 1.7 x 0.8 x 0.7 cm focal outpouching (axial image 199 of series 16 and coronal image 51 of series 18), compatible with a pseudoaneurysm. Anterior to the superficial femoral artery (axial image 204 of series 16) there is a large 5.6 x 7.6 x 8.1 cm low-intermediate  attenuation lesion, likely to represent a resolving hematoma. Multiple prominent borderline enlarged retroperitoneal lymph nodes, nonspecific. Reproductive: Prostate gland and seminal vesicles are unremarkable in appearance. Other: No significant volume of ascites.  No pneumoperitoneum. Musculoskeletal: There are no aggressive appearing lytic or blastic lesions noted in the visualized portions of the skeleton. VASCULAR MEASUREMENTS PERTINENT TO TAVR: AORTA: Minimal Aortic Diameter-11 x 14 mm Severity of Aortic Calcification-severe RIGHT PELVIS: Right Common Iliac Artery - Minimal Diameter-6.6 x 5.2 mm Tortuosity - mild Calcification-severe Right External Iliac Artery - Minimal Diameter-5.4 x 5.9 mm Tortuosity - mild Calcification-moderate Right Common Femoral Artery - Minimal Diameter-6.1 x 5.3 mm Tortuosity - mild Calcification-moderate LEFT PELVIS: Left Common Iliac Artery - Minimal Diameter-7.0 x 7.0 mm Tortuosity - mild Calcification-severe Left External Iliac Artery - Minimal Diameter-7.5 x 6.7 mm Tortuosity - mild Calcification-mild Left Common Femoral Artery - Minimal Diameter-8.3 x 6.6 mm Tortuosity-mild Calcification-mild-to-moderate Review of the MIP images confirms the above findings. IMPRESSION: 1. Vascular findings and measurements pertinent to potential TAVR procedure, as detailed above. 2. Large low-intermediate attenuation fluid collection in the left inguinal region likely to represent a resolving hematoma. There is also a small pseudoaneurysm extending off the medial aspect of the distal left common femoral artery, as above. 3. Severe thickening calcification of the aortic valve, compatible with the reported clinical history of severe aortic stenosis. 4. Cardiomegaly with moderate left ventricular dilatation. 5. Aortic atherosclerosis, in addition to left main and 3 vessel coronary artery disease. Please note that although the presence of coronary artery calcium documents the presence of coronary  artery disease, the severity of this disease and any potential stenosis cannot be assessed  on this non-gated CT examination. Assessment for potential risk factor modification, dietary therapy or pharmacologic therapy may be warranted, if clinically indicated. 6. Moderate bilateral renal atrophy. 7. Colonic diverticulosis without evidence of acute diverticulitis at this time. 8. Additional incidental findings, as above. Electronically Signed   By: Vinnie Langton M.D.   On: 09/30/2018 14:13   Disposition   Pt is being discharged home today in good condition.  Follow-up Plans & Appointments    Follow-up Information    Jose Stanford, PA-C. Go on 10/29/2018.   Specialties: Cardiology, Radiology Why: @ 3:30pm, please arrive at least 10 minutes early Contact information: [place] [id number] [phone]            Discharge Medications   Allergies as of 10/23/2018   No Known Allergies     Medication List    STOP taking these medications   metoprolol tartrate 50 MG tablet Commonly known as: Lopressor     TAKE these medications   acetaminophen 500 MG tablet Commonly known as: TYLENOL Take 500-1,000 mg by mouth daily as needed for moderate pain.   aspirin EC 81 MG tablet Take 1 tablet (81 mg total) by mouth daily. What changed: when to take this   atorvastatin 80 MG tablet Commonly known as: LIPITOR Take 1 tablet (80 mg total) by mouth daily with supper.   cetirizine 10 MG tablet Commonly known as: ZYRTEC Take 10 mg by mouth daily.   clopidogrel 75 MG tablet Commonly known as: PLAVIX Take 1 tablet (75 mg total) by mouth daily with supper.   gabapentin 300 MG capsule Commonly known as: NEURONTIN Take 1 capsule (300 mg total) by mouth every morning. What changed:   how much to take  when to take this  additional instructions   lidocaine 5 % Commonly known as: Lidoderm Place 1 patch onto the skin daily. Remove & Discard  patch within 12 hours or as directed by MD   LORazepam 1 MG tablet Commonly known as: ATIVAN Take 1 mg by mouth 2 (two) times daily as needed for anxiety.   midodrine 10 MG tablet Commonly known as: PROAMATINE Take 1 tablet (10 mg total) by mouth every Monday, Wednesday, and Friday with hemodialysis. What changed:   when to take this  additional instructions   multivitamin Tabs tablet Take 1 tablet by mouth at bedtime.   mupirocin ointment 2 % Commonly known as: BACTROBAN Apply 1 application topically daily as needed (irritation).   oxyCODONE 5 MG immediate release tablet Commonly known as: Oxy IR/ROXICODONE Take 1-2 tablets (5-10 mg total) by mouth every 4 (four) hours as needed for moderate pain.   pantoprazole 40 MG tablet Commonly known as: PROTONIX Take 1 tablet (40 mg total) by mouth daily. What changed: when to take this   sucroferric oxyhydroxide 500 MG chewable tablet Commonly known as: VELPHORO Chew 2 tablets (1,000 mg total) by mouth 3 (three) times daily with meals. What changed:   how much to take  when to take this  additional instructions   vitamin B-12 1000 MCG tablet Commonly known as: CYANOCOBALAMIN Take 1,000 mcg by mouth every evening.           Outstanding Labs/Studies   CBC  Duration of Discharge Encounter   Greater than 30 minutes including physician time.  SignedAngelena Form, PA-C 10/23/2018, 10:54 AM [phone]

## 2018-10-23 NOTE — Care Management Important Message (Signed)
Important Message  Patient Details  Name: Jose Garrett MRN: [id number] Date of Birth: [birth year]   Medicare Important Message Given:  Yes     Jose Garrett 10/23/2018, 12:35 PM

## 2018-10-24 DIAGNOSIS — Z952 Presence of prosthetic heart valve: Secondary | ICD-10-CM

## 2018-10-24 DIAGNOSIS — I472 Ventricular tachycardia: Secondary | ICD-10-CM

## 2018-10-24 DIAGNOSIS — I4729 Other ventricular tachycardia: Secondary | ICD-10-CM

## 2018-10-24 LAB — CBC
HCT: 27 % — ABNORMAL LOW (ref 39.0–52.0)
HCT: 29 % — ABNORMAL LOW (ref 39.0–52.0)
Hemoglobin: 8.5 g/dL — ABNORMAL LOW (ref 13.0–17.0)
Hemoglobin: 9.1 g/dL — ABNORMAL LOW (ref 13.0–17.0)
MCH: 33.9 pg (ref 26.0–34.0)
MCH: 34.1 pg — ABNORMAL HIGH (ref 26.0–34.0)
MCHC: 31.5 g/dL (ref 30.0–36.0)
MCHC: 31.4 g/dL (ref 30.0–36.0)
MCV: 107.6 fL — ABNORMAL HIGH (ref 80.0–100.0)
MCV: 108.6 fL — ABNORMAL HIGH (ref 80.0–100.0)
Platelets: 35 10*3/uL — ABNORMAL LOW (ref 150–400)
Platelets: 42 10*3/uL — ABNORMAL LOW (ref 150–400)
RBC: 2.51 MIL/uL — ABNORMAL LOW (ref 4.22–5.81)
RBC: 2.67 MIL/uL — ABNORMAL LOW (ref 4.22–5.81)
RDW: 17.8 % — ABNORMAL HIGH (ref 11.5–15.5)
RDW: 17.9 % — ABNORMAL HIGH (ref 11.5–15.5)
WBC: 3.8 10*3/uL — ABNORMAL LOW (ref 4.0–10.5)
WBC: 5.4 10*3/uL (ref 4.0–10.5)
nRBC: 0.5 % — ABNORMAL HIGH (ref 0.0–0.2)
nRBC: 0.4 % — ABNORMAL HIGH (ref 0.0–0.2)

## 2018-10-24 LAB — BASIC METABOLIC PANEL
Anion gap: 13 (ref 5–15)
BUN: 40 mg/dL — ABNORMAL HIGH (ref 8–23)
CO2: 25 mmol/L (ref 22–32)
Calcium: 8.9 mg/dL (ref 8.9–10.3)
Chloride: 98 mmol/L (ref 98–111)
Creatinine, Ser: 8.63 mg/dL — ABNORMAL HIGH (ref 0.61–1.24)
GFR calc Af Amer: 7 mL/min — ABNORMAL LOW (ref >60)
GFR calc non Af Amer: 6 mL/min — ABNORMAL LOW (ref >60)
Glucose, Bld: 100 mg/dL — ABNORMAL HIGH (ref 70–99)
Potassium: 4.7 mmol/L (ref 3.5–5.1)
Sodium: 136 mmol/L (ref 135–145)

## 2018-10-24 LAB — RENAL FUNCTION PANEL
Albumin: 3.3 g/dL — ABNORMAL LOW (ref 3.5–5.0)
Anion gap: 16 — ABNORMAL HIGH (ref 5–15)
BUN: 42 mg/dL — ABNORMAL HIGH (ref 8–23)
CO2: 22 mmol/L (ref 22–32)
Calcium: 9.2 mg/dL (ref 8.9–10.3)
Chloride: 98 mmol/L (ref 98–111)
Creatinine, Ser: 8.76 mg/dL — ABNORMAL HIGH (ref 0.61–1.24)
GFR calc Af Amer: 7 mL/min — ABNORMAL LOW (ref >60)
GFR calc non Af Amer: 6 mL/min — ABNORMAL LOW (ref >60)
Glucose, Bld: 109 mg/dL — ABNORMAL HIGH (ref 70–99)
Phosphorus: 5.1 mg/dL — ABNORMAL HIGH (ref 2.5–4.6)
Potassium: 4.9 mmol/L (ref 3.5–5.1)
Sodium: 136 mmol/L (ref 135–145)

## 2018-10-24 MED ORDER — PENTAFLUOROPROP-TETRAFLUOROETH EX AERO: 1.0000 "application " | INHALATION_SPRAY | CUTANEOUS | Status: DC | PRN

## 2018-10-24 MED ORDER — HEPARIN SODIUM (PORCINE) 1000 UNIT/ML DIALYSIS: 1000.0000 [IU] | INTRAMUSCULAR | Status: DC | PRN

## 2018-10-24 MED ORDER — LIDOCAINE-PRILOCAINE 2.5-2.5 % EX CREA: 1.0000 "application " | TOPICAL_CREAM | CUTANEOUS | Status: DC | PRN

## 2018-10-24 MED ORDER — SODIUM CHLORIDE 0.9 % IV SOLN: 100.0000 mL | INTRAVENOUS | Status: DC | PRN

## 2018-10-24 MED ORDER — CHLORHEXIDINE GLUCONATE CLOTH 2 % EX PADS: 6.0000 | MEDICATED_PAD | Freq: Every day | CUTANEOUS | Status: DC

## 2018-10-24 MED ORDER — DARBEPOETIN ALFA 60 MCG/0.3ML IJ SOSY
PREFILLED_SYRINGE | INTRAMUSCULAR | Status: AC
  Administered 2018-10-24: 60 ug
  Filled 2018-10-24: qty 0.3

## 2018-10-24 MED ORDER — DARBEPOETIN ALFA 60 MCG/0.3ML IJ SOSY
60.0000 ug | PREFILLED_SYRINGE | INTRAMUSCULAR | Status: DC
  Filled 2018-10-24: qty 0.3

## 2018-10-24 MED ORDER — LIDOCAINE HCL (PF) 1 % IJ SOLN: 5.0000 mL | INTRAMUSCULAR | Status: DC | PRN

## 2018-10-24 MED ORDER — ALTEPLASE 2 MG IJ SOLR: 2.0000 mg | Freq: Once | INTRAMUSCULAR | Status: DC | PRN

## 2018-10-24 MED FILL — Potassium Chloride Inj 2 mEq/ML: INTRAVENOUS | Qty: 40 | Status: AC

## 2018-10-24 MED FILL — Magnesium Sulfate Inj 50%: INTRAMUSCULAR | Qty: 10 | Status: AC

## 2018-10-24 MED FILL — Heparin Sodium (Porcine) Inj 1000 Unit/ML: INTRAMUSCULAR | Qty: 30 | Status: AC

## 2018-10-24 NOTE — Progress Notes (Signed)
Renal Navigator noted canceled discharge order and spoke with Cardiology PA regarding patient's plan of care. Renal Navigator learned that patient will be placed with a LifeVest prior to discharge. Renal Navigator noted that patient's OP HD clinic/[facility] will need to be informed of this for patient safety reasons and provided this notification to clinic manager.  Per [employer], staff must be trained by [employer] Rep and medical records need to be sent to [employer] for approval, including sign off my Market researcher at [facility] HD clinic and written confirmation from the attending Nephrologist that patient is stable for outpatient dialysis treatments. The clinic also must have a [employer] manual and emergency contact number. Clinic manager states she has manual and Renal Navigator spoke with [employer] Rep/Ellen Guidotti to obtain contact number, and provided to clinic. Clinic manager will submit medical records to Clinical Services for approval. Renal Navigator initiated call to [employer] Rep to request training for staff at clinic.  Patient is not cleared for discharge until his OP HD clinic/[facility] has approved his re-admission for OP HD treatment. Renal Navigator will follow closely.  Alphonzo Cruise, [place] Renal Navigator [phone]

## 2018-10-24 NOTE — Progress Notes (Signed)
Renal Navigator received message from OP HD clinic/[facility] manager requesting Cardiologist to call Medical Director at clinic/Dr. [place]. Renal Navigator notified Cardiology PA/K. Grandville Silos and provided Dr. Deterding's contact number. PA confirmed that she would has passed this along to Dr. Cooper/Cardiologist. Renal Navigator to continue to assist with facilitating patient's re-admission to OP HD clinic/[facility] post LifeVest and continue to follow closely.  Alphonzo Cruise, [place] Renal Navigator [phone]

## 2018-10-24 NOTE — Progress Notes (Signed)
Patient's last dose of Micera 100 mcg IV q2weeks given 10/15/18 PTA. Confirmed with Dr. Johnney Ou - she would like to start Aranesp early inpatient today, aware of last dose.

## 2018-10-24 NOTE — Progress Notes (Signed)
Renal Navigator followed up regarding approval process for patient's return to OP HD clinic for treatment now that he will have LifeVest. Per Cardiology PA, Medical Director/Nephrologist-Dr. Deterding has spoken with Cardiologist/Dr. Burt Knack. Per [facility] clinic manager, Dr. Jimmy Footman still needs to sign off on form for patient to return to OP HD clinic. Renal Navigator has spoken with Attending Nephrologist/Dr. Johnney Ou who states she feels patient is appropriate for OP HD treatment and plans to document this in her note today. Both of these documents will need to be faxed to [place] by OP HD clinic manager once available. 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  Per [employer] manager, the [employer] Rep, Estill Bamberg is available and has arranged for training at [facility] on Tuesday, 8/18 and Wednesday, 8/19.  Patient will require inpatient HD on 10/27/18. Renal Navigator will follow up on approval status on Monday, 10/27/18. Therefore, patient is not cleared for discharge from an OP HD standpoint at this time or any time over the weekend.  Jose Garrett, [place] Renal Navigator [phone]

## 2018-10-24 NOTE — Progress Notes (Signed)
Patient arrived back to the unit from hemodialysis. No s/s of acute distress noted. Patient resting in bed. Denies pain at this time. Nursing will continue to monitor.

## 2018-10-24 NOTE — Progress Notes (Addendum)
[facility] Progress Note   Subjective:   Patient seen in room, sitting up eating breakfast. Having arrhythmias - bigeminy when I'm in the room.  Cardiology discussing plans.  Likely will d/c with life vest.  He has no particular complaints this AM>    Objective Vitals:   10/23/18 1935 10/24/18 0108 10/24/18 0539 10/24/18 0808  BP: (!) 114/44 (!) 120/44 (!) 118/47 (!) 117/40  Pulse: (!) 42 71 (!) 38   Resp: 13 19 (!) 23   Temp: 98.2 F (36.8 C) (!) 97.5 F (36.4 C) (!) 97.5 F (36.4 C)   TempSrc: Oral Oral Oral   SpO2: 95% 97% 97%   Weight:   87.5 kg   Height:       Physical Exam General: Well developed, alert male, in NAD Heart: RRR, no murmurs, rubs or gallops. Ventricular bigeminy seen on monitor Lungs: CTA bilaterally without wheezing, rhonchi or rales Abdomen: Soft, non-tender, non-distended, +BS Extremities: 2+ LE edema Dialysis Access:  RUE AVF + thrill  Additional Objective Labs: Basic Metabolic Panel: Recent Labs  Lab 10/18/18 0334  10/19/18 1044  10/22/18 0208 10/23/18 0432 10/24/18 0319  NA 139   < > 139   < > 138 139 136  K 3.8   < > 4.0   < > 4.9 4.2 4.7  CL 94*   < > 97*   < > 98 99 98  CO2 26   < > 22   < > 27 27 25   GLUCOSE 87   < > 121*   < > 101* 98 100*  BUN 48*   < > 65*   < > 33* 30* 40*  CREATININE 9.23*   < > 10.77*   < > 7.40* 7.19* 8.63*  CALCIUM 8.7*   < > 9.2   < > 8.6* 9.1 8.9  PHOS 7.0*  --  6.9*  --   --   --   --    < > = values in this interval not displayed.   Liver Function Tests: Recent Labs  Lab 10/18/18 0334 10/19/18 1044  ALBUMIN 3.4* 3.6   CBC: Recent Labs  Lab 10/19/18 1045 10/20/18 0638  10/22/18 0208 10/23/18 0432 10/24/18 0319  WBC 4.7 4.2  --  4.5 5.2 3.8*  NEUTROABS  --  2.8  --   --   --   --   HGB 10.0* 9.3*   < > 8.7* 8.4* 8.5*  HCT 30.6* 29.1*   < > 27.9* 26.9* 27.0*  MCV 104.4* 106.6*  --  109.4* 110.2* 107.6*  PLT 74* 64*  --  51* 44* 35*   < > = values in this interval not  displayed.   Blood Culture    Component Value Date/Time   SDES URINE, RANDOM 02/01/2016 1707   SPECREQUEST NONE 02/01/2016 1707   CULT (A) 02/01/2016 1707    10,000 COLONIES/mL PSEUDOMONAS AERUGINOSA 40,000 COLONIES/mL ENTEROBACTER AEROGENES    REPTSTATUS 02/04/2016 FINAL 02/01/2016 1707    Studies/Results: No results found. Medications: . sodium chloride    . nitroGLYCERIN    . phenylephrine (NEO-SYNEPHRINE) Adult infusion     . amiodarone  400 mg Oral BID  . aspirin EC  81 mg Oral QPM  . atorvastatin  80 mg Oral Q supper  . Chlorhexidine Gluconate Cloth  6 each Topical Q0600  . clopidogrel  75 mg Oral Q supper  . gabapentin  300 mg Oral Daily  . midodrine  10 mg Oral  Q M,W,F-HD  . multivitamin  1 tablet Oral QHS  . pantoprazole  40 mg Oral QPM  . sodium chloride flush  3 mL Intravenous Q12H  . sucroferric oxyhydroxide  1,000 mg Oral TID WC    Dialysis Orders: [facility] 4h 400/800 82.5kg 2/2 bath Hep 8000 RUE AVF Sensipar 180mg  PO TIW with dialysis Mircera 1105mcg IV q2weeks (last given 10/15/2018) Calcitriol 1.5 mcg PO TIW velphoro 3 tabs PO TID AC and 2/snack  Recent labs: Hgb 10.4, Tsat 29; Ferritin 1303; Plt 62K; K+ 4.6; Albumin 4.1; calcium 9.4; PTH 615   Assessment/Plan:  1.  Severe AS: s/p TAVR 8/11. Per cardiology.  Having some ventricular arrhythmia (Bigeminy) post procedure, electrolytes ok.  Per Cardiology - may need to d/c with lifevest.  If this is the case cannot return to outpt HD until unit staff trained on lifevest.   2. ESRD:MWF.   HD today - attempt UF 3-3.5L with post weight standing.  May need additional HD tomorrow for volume.   3. Hypertension/volume:  BP controlled. Continue midodrine before dialysis. Over EDW by 5kg this AM due to short dialysis earlier in the week.  May need extra tx for volume tomorrow.  4. Anemia:Hgb 9.3 > 8.7 > 8. Aranesp 61mcg with treatment today.  Last iron level ok.  5. Metabolic bone disease:Calcium  9.1. Recent PTH elevated. Will continue sensipar and calcitriol. Phos 6.9- continue home binder. 6. Nutrition:Renal diet with fluid restrictions 7. Aortic stenosis:  TAVR 10/21/2018. Per cardiology 8. Thrombocytopenia: plt 35 this AM, is on aspirin and plavix. Has tolerated heparin with dialysis at outpatient unit, holding here.  Jannifer Hick MD [facility] Pager [phone]  ADDENDUM:  Pt tolerated full HD session today with net UF 3L.  He had no hemodynamic instability or complaints during the treatment.

## 2018-10-24 NOTE — Progress Notes (Addendum)
[facility] TEAM  Patient Name: Jose Garrett Date of Encounter: 10/24/2018  Primary Cardiologist: Dr. Angelena Form Dr. Burt Knack & Dr. Roxy Manns (TAVR)   Hospital Problem List     Principal Problem:   S/P TAVR (transcatheter aortic valve replacement) Active Problems:   Acute on chronic combined systolic and diastolic CHF (congestive heart failure) ([place])   Essential hypertension, benign   AL amyloidosis (HCC)   Anemia of chronic disease   ESRD (end stage renal disease) on dialysis ([place])   PAD (peripheral artery disease) (HCC)   Thrombocytopenia (HCC)   Coronary artery disease involving native coronary artery of native heart without angina pectoris   Anxiety   Polymorphic ventricular tachycardia ([place])    Subjective   Getting HD. Feeling okay. No complaints.    Inpatient Medications    Scheduled Meds: . amiodarone  400 mg Oral BID  . atorvastatin  80 mg Oral Q supper  . Chlorhexidine Gluconate Cloth  6 each Topical Q0600  . clopidogrel  75 mg Oral Q supper  . darbepoetin (ARANESP) injection - DIALYSIS  60 mcg Intravenous Q Fri-HD  . gabapentin  300 mg Oral Daily  . midodrine  10 mg Oral Q M,W,F-HD  . multivitamin  1 tablet Oral QHS  . pantoprazole  40 mg Oral QPM  . sodium chloride flush  3 mL Intravenous Q12H  . sucroferric oxyhydroxide  1,000 mg Oral TID WC   Continuous Infusions: . sodium chloride    . sodium chloride    . sodium chloride    . nitroGLYCERIN    . phenylephrine (NEO-SYNEPHRINE) Adult infusion     PRN Meds: sodium chloride, sodium chloride, sodium chloride, acetaminophen **OR** acetaminophen, alteplase, heparin, lidocaine (PF), lidocaine-prilocaine, LORazepam, morphine injection, ondansetron (ZOFRAN) IV, oxyCODONE, pentafluoroprop-tetrafluoroeth, sodium chloride flush, traMADol   Vital Signs    Vitals:   10/24/18 0916 10/24/18 0930 10/24/18 1000 10/24/18 1030  BP: (!) 104/58 (!) 96/57 (!) 95/53 (!) 104/47   Pulse: (!) 43 (!) 48 69 69  Resp:      Temp:      TempSrc:      SpO2:      Weight:      Height:        Intake/Output Summary (Last 24 hours) at 10/24/2018 1037 Last data filed at 10/23/2018 2010 Gross per 24 hour  Intake 240 ml  Output -  Net 240 ml   Filed Weights   10/23/18 0334 10/24/18 0539 10/24/18 0900  Weight: 86.5 kg 87.5 kg 87.2 kg    Physical Exam    GEN: chronically ill appearing getting HD HEENT: Grossly normal.  Neck: Supple, no JVD, or masses. Cardiac: RRR, 2/6 SEM @ RUSB. No rubs, or gallops. No clubbing, cyanosis. 2+ LLE edema which is chronic. Trace RLE.   Respiratory:  Respirations regular and unlabored, clear to auscultation bilaterally. GI: Soft, nontender, nondistended, BS + x 4. MS: no deformity or atrophy Skin: warm and dry, no rash. Right groin site with some mild ecchymosis under bandage. Left brachial site stable with ecchymosis down arm. Left groin seroma stable.  Neuro:  Strength and sensation are intact. Psych: AAOx3.  Normal affect.  Labs    CBC Recent Labs    10/23/18 0432 10/24/18 0319  WBC 5.2 3.8*  HGB 8.4* 8.5*  HCT 26.9* 27.0*  MCV 110.2* 107.6*  PLT 44* 35*   Basic Metabolic Panel Recent Labs    10/22/18 0208 10/23/18 0432 10/24/18 0319  NA 138 139 136  K 4.9 4.2 4.7  CL 98 99 98  CO2 27 27 25   GLUCOSE 101* 98 100*  BUN 33* 30* 40*  CREATININE 7.40* 7.19* 8.63*  CALCIUM 8.6* 9.1 8.9  MG 1.7 2.2  --    Liver Function Tests No results for input(s): AST, ALT, ALKPHOS, BILITOT, PROT, ALBUMIN in the last 72 hours. No results for input(s): LIPASE, AMYLASE in the last 72 hours. Cardiac Enzymes No results for input(s): CKTOTAL, CKMB, CKMBINDEX, TROPONINI in the last 72 hours. BNP Invalid input(s): POCBNP D-Dimer No results for input(s): DDIMER in the last 72 hours. Hemoglobin A1C No results for input(s): HGBA1C in the last 72 hours. Fasting Lipid Panel No results for input(s): CHOL, HDL, LDLCALC, TRIG, CHOLHDL,  LDLDIRECT in the last 72 hours. Thyroid Function Tests No results for input(s): TSH, T4TOTAL, T3FREE, THYROIDAB in the last 72 hours.  Invalid input(s): FREET3  Telemetry    Sinus with ventricular bigeminy and short runs of NSVT (improved from previous days) - Personally Reviewed  ECG    Sinus tachy with HR 101, RBBB and ventricular bigeminy - Personally Reviewed  Radiology    No results found.  Cardiac Studies   TAVR OPERATIVE NOTE   Date of Procedure:                10/21/2018  Preoperative Diagnosis:      Severe Aortic Stenosis   Postoperative Diagnosis:    Same   Procedure:        Transcatheter Aortic Valve Replacement - Percutaneous Right Transfemoral Approach             Edwards Sapien 3 Ultra THV (size 26 mm, model # [id number], serial # [id number])              Co-Surgeons:                        Valentina Gu. Roxy Manns, MD and Sherren Mocha, MD  Anesthesiologist:                  Nolon Nations, MD  Echocardiographer:              Jenkins Rouge, MD  Pre-operative Echo Findings: ? Severe aortic stenosis ? Moderate-severe left ventricular systolic dysfunction  Post-operative Echo Findings: ? No paravalvular leak ? Unchanged left ventricular systolic function  __________________   Echo 10/22/18:  IMPRESSIONS  1. The left ventricle has moderate-severely reduced systolic function, with an ejection fraction of 30-35%. The cavity size was normal. There is mildly increased left ventricular wall thickness. Left ventricular diastolic Doppler parameters are indeterminate. Left ventricular diffuse hypokinesis.  2. The right ventricle has mildly reduced systolic function. The cavity was mildly enlarged.  3. Large pleural effusion in the left lateral region.  4. Small pericardial effusion.  5. The mitral valve is abnormal. Mild thickening of the mitral valve leaflet. There is mild mitral annular calcification present.  6. The tricuspid valve is grossly normal.  7.  The aorta is normal in size and structure.  8. The inferior vena cava was dilated in size with >50% respiratory variability.  9. Moderate to severe global reduction in LV systolic function; mild LVH; s/p TAVR with no AI and mean gradient 16 mmHg; mild RVE with mildly reduced function; small pericardial effusion.   Patient Profile     Jose Garrett is a 68 y.o. male with a history of of ESRD on HD, AL amyloid (  kidney involvement), HTN, HLD, GERD, anemia, thrombocytopenia, ischemic cardiomyopathy, chronic combined CHF, CAD, carotid artery disease, PAD w/ left femoral to below the knee popliteal bypass with synthetic graft, s/p toe amputations, left groin seroma, and severe AS who presented to [facility] on 10/17/18 with dyspnea and failure to thrive. He was admitted for HD and monitoring until his planned TAVR on 10/21/18.  Assessment & Plan    Severe AS: s/p successful TAVR with a 26 mm Edwards Sapien Ultra THV via the R TF approach on 10/21/18. Post operative echo showed persistent moderate to severe LV systolic dysfunction (EF 57-26%) with normally functioning TAVR with no AI and mean gradient 16 mmHg. There was also mild RVE with mildly reduced function and a small pericardial effusion. Groin site/ brachial site are stable. ECG with old RBBB and no high grade heart block. He was continued on Asprin and plavix. Given thrombocytopenia with platelet count down to 35K will drop aspirin at this time.   ESRD on HD (M,W,F): per nephrology. We are hopeful he will tolerate HD better now that his severe AS is fixed. Because of need for Lifevest placement and his home HD center to be properly trained, timing of discharge is pending. Hopefully, he can go home after dialysis today.   Acute on chronic combined S/D CHF: as evidenced by an extremely elevated BNP > 3000 on preadmission lab work and need to admission due to worsening dyspnea. Volume management per nephrology. Patient is anuric. GMDT limited by hypotension with  HD.   CAD: pre TAVR cath showed severe single vessel CAD with chronic total occlusion of the RCA, collateralized with L--->R collaterals. Mild calcific nonobstructive LCA disease, anatomy unchanged from previous cath studies. No PCI targets. Continue medical therapy.   PAD: has an appointment with Dr. Donzetta Matters later this month to discuss R LE bypass and possible drainage of L groin seroma   Thrombocytopenia: this is chronic, but platelets down to 35,000 today. Will drop aspirin at this time.   Polymorphic VT: tele with frequent PVCs in a pattern of bigeminy and R on T phenomena with short runs of polymorphic VT. Electrolytes are normal. QTc okay on ECG. He has not been on any BB given hypotension with HD. He has been started on amiodarone 400 mg BID and order for LifeVest has been placed later today. Training will be provided to patient and family as well as his outpatient hemodialysis center.   Ischemic cardiomyopathy: as above, we will have LifeVest placed.   SignedAngelena Form, PA-C  10/24/2018, 10:37 AM  Pager [phone]  Patient seen, examined. Available data reviewed. Agree with findings, assessment, and plan as outlined by Nell Range, PA-C. Exam unchanged as patient is alert, oriented, NAD. Lungs CTA, heart RRR with 2/6 systolic murmur at the RUSB, abdomen soft, NT, 2+ LLE edema unchanged. Tele reveals frequent PVCs, bigeminy, frequent short runs of NSVT.  Plan as outlined above. D/W renal navigator. D/W Dr Jimmy Footman. Pt to stay in hospital over the weekend, inpatient hemodialysis Monday, then LifeVest training at the outpatient dialysis Tuesday/Wednesday. Plan hospital DC Monday after dialysis.   Sherren Mocha, M.D. 10/24/2018 10:46 PM

## 2018-10-25 ENCOUNTER — Other Ambulatory Visit: Payer: Self-pay

## 2018-10-25 DIAGNOSIS — N186 End stage renal disease: Secondary | ICD-10-CM

## 2018-10-25 DIAGNOSIS — I10 Essential (primary) hypertension: Secondary | ICD-10-CM

## 2018-10-25 DIAGNOSIS — I739 Peripheral vascular disease, unspecified: Secondary | ICD-10-CM

## 2018-10-25 DIAGNOSIS — D696 Thrombocytopenia, unspecified: Secondary | ICD-10-CM

## 2018-10-25 DIAGNOSIS — D638 Anemia in other chronic diseases classified elsewhere: Secondary | ICD-10-CM

## 2018-10-25 DIAGNOSIS — Z992 Dependence on renal dialysis: Secondary | ICD-10-CM

## 2018-10-25 LAB — BASIC METABOLIC PANEL
Anion gap: 12 (ref 5–15)
BUN: 25 mg/dL — ABNORMAL HIGH (ref 8–23)
CO2: 29 mmol/L (ref 22–32)
Calcium: 9.1 mg/dL (ref 8.9–10.3)
Chloride: 98 mmol/L (ref 98–111)
Creatinine, Ser: 5.87 mg/dL — ABNORMAL HIGH (ref 0.61–1.24)
GFR calc Af Amer: 11 mL/min — ABNORMAL LOW (ref >60)
GFR calc non Af Amer: 9 mL/min — ABNORMAL LOW (ref >60)
Glucose, Bld: 97 mg/dL (ref 70–99)
Potassium: 3.9 mmol/L (ref 3.5–5.1)
Sodium: 139 mmol/L (ref 135–145)

## 2018-10-25 LAB — CBC
HCT: 27.7 % — ABNORMAL LOW (ref 39.0–52.0)
Hemoglobin: 8.9 g/dL — ABNORMAL LOW (ref 13.0–17.0)
MCH: 34.5 pg — ABNORMAL HIGH (ref 26.0–34.0)
MCHC: 32.1 g/dL (ref 30.0–36.0)
MCV: 107.4 fL — ABNORMAL HIGH (ref 80.0–100.0)
Platelets: 33 10*3/uL — ABNORMAL LOW (ref 150–400)
RBC: 2.58 MIL/uL — ABNORMAL LOW (ref 4.22–5.81)
RDW: 17.8 % — ABNORMAL HIGH (ref 11.5–15.5)
WBC: 4 10*3/uL (ref 4.0–10.5)
nRBC: 0 % (ref 0.0–0.2)

## 2018-10-25 MED ORDER — ALTEPLASE 2 MG IJ SOLR: 2.0000 mg | Freq: Once | INTRAMUSCULAR | Status: DC | PRN

## 2018-10-25 MED ORDER — PENTAFLUOROPROP-TETRAFLUOROETH EX AERO: 1.0000 "application " | INHALATION_SPRAY | CUTANEOUS | Status: DC | PRN

## 2018-10-25 MED ORDER — LIDOCAINE-PRILOCAINE 2.5-2.5 % EX CREA: 1.0000 "application " | TOPICAL_CREAM | CUTANEOUS | Status: DC | PRN

## 2018-10-25 MED ORDER — LIDOCAINE HCL (PF) 1 % IJ SOLN: 5.0000 mL | INTRAMUSCULAR | Status: DC | PRN

## 2018-10-25 MED ORDER — SODIUM CHLORIDE 0.9 % IV SOLN: 100.0000 mL | INTRAVENOUS | Status: DC | PRN

## 2018-10-25 MED ORDER — HEPARIN SODIUM (PORCINE) 1000 UNIT/ML DIALYSIS: 1000.0000 [IU] | INTRAMUSCULAR | Status: DC | PRN

## 2018-10-25 NOTE — Progress Notes (Signed)
Notified about patient having runs of V Tach. Patient sitting up in bed, alert and oriented, asymptomatic. Vital signs stable.   Tawanna Sat, RN 10/25/2018 3:09 AM

## 2018-10-25 NOTE — Progress Notes (Signed)
Pt ambulated around the unit 300 feet with cane pt tolerated well

## 2018-10-25 NOTE — Progress Notes (Signed)
Progress Note  Patient Name: Jose Garrett Date of Encounter: 10/25/2018  Primary Cardiologist: Lauree Chandler, MD   Subjective   Denies chest pain and shortness of breath. Has occasional palpitations.  Inpatient Medications    Scheduled Meds:  amiodarone  400 mg Oral BID   atorvastatin  80 mg Oral Q supper   Chlorhexidine Gluconate Cloth  6 each Topical Q0600   clopidogrel  75 mg Oral Q supper   darbepoetin (ARANESP) injection - DIALYSIS  60 mcg Intravenous Q Fri-HD   gabapentin  300 mg Oral Daily   midodrine  10 mg Oral Q M,W,F-HD   multivitamin  1 tablet Oral QHS   pantoprazole  40 mg Oral QPM   sodium chloride flush  3 mL Intravenous Q12H   sucroferric oxyhydroxide  1,000 mg Oral TID WC   Continuous Infusions:  sodium chloride     nitroGLYCERIN     phenylephrine (NEO-SYNEPHRINE) Adult infusion     PRN Meds: sodium chloride, acetaminophen **OR** acetaminophen, LORazepam, morphine injection, ondansetron (ZOFRAN) IV, oxyCODONE, sodium chloride flush, traMADol   Vital Signs    Vitals:   10/25/18 0800 10/25/18 0830 10/25/18 0900 10/25/18 0929  BP: 122/66 (!) 112/58 115/65 128/66  Pulse: 60 61 77 64  Resp: 14 11 12 13   Temp:    97.6 F (36.4 C)  TempSrc:    Oral  SpO2:    96%  Weight:    82.3 kg  Height:        Intake/Output Summary (Last 24 hours) at 10/25/2018 1128 Last data filed at 10/25/2018 0929 Gross per 24 hour  Intake --  Output 6000 ml  Net -6000 ml   Filed Weights   10/25/18 0500 10/25/18 0650 10/25/18 0929  Weight: 84.9 kg 85.5 kg 82.3 kg    Telemetry    Sinus rhythm, PVC's, 3-beat runs of NSVT - Personally Reviewed  ECG    Sinus rhythm, 1st degree AV block, RBBB, ST-T abnormalities c/w inferior and anterolateral ischemia - Personally Reviewed  Physical Exam   GEN: No acute distress.   Neck: No JVD Cardiac: RRR, 2/6 ESM at RUSB, no rubs, or gallops.  Respiratory: Clear to auscultation bilaterally. GI: Soft,  nontender, non-distended  MS: Chronic 2+ LLE and trace RLE edema; No deformity. Neuro:  Nonfocal  Psych: Normal affect   Labs    Chemistry Recent Labs  Lab 10/19/18 1044  10/24/18 0319 10/24/18 1200 10/25/18 0340  NA 139   < > 136 136 139  K 4.0   < > 4.7 4.9 3.9  CL 97*   < > 98 98 98  CO2 22   < > 25 22 29   GLUCOSE 121*   < > 100* 109* 97  BUN 65*   < > 40* 42* 25*  CREATININE 10.77*   < > 8.63* 8.76* 5.87*  CALCIUM 9.2   < > 8.9 9.2 9.1  ALBUMIN 3.6  --   --  3.3*  --   GFRNONAA 4*   < > 6* 6* 9*  GFRAA 5*   < > 7* 7* 11*  ANIONGAP 20*   < > 13 16* 12   < > = values in this interval not displayed.     Hematology Recent Labs  Lab 10/24/18 0319 10/24/18 1200 10/25/18 0340  WBC 3.8* 5.4 4.0  RBC 2.51* 2.67* 2.58*  HGB 8.5* 9.1* 8.9*  HCT 27.0* 29.0* 27.7*  MCV 107.6* 108.6* 107.4*  MCH 33.9 34.1* 34.5*  MCHC 31.5 31.4 32.1  RDW 17.8* 17.9* 17.8*  PLT 35* 42* 33*    Cardiac EnzymesNo results for input(s): TROPONINI in the last 168 hours. No results for input(s): TROPIPOC in the last 168 hours.   BNPNo results for input(s): BNP, PROBNP in the last 168 hours.   DDimer No results for input(s): DDIMER in the last 168 hours.   Radiology    No results found.  Cardiac Studies   TAVR OPERATIVE NOTE   Date of Procedure:10/21/2018  Preoperative Diagnosis:Severe Aortic Stenosis   Postoperative Diagnosis:Same   Procedure:   Transcatheter Aortic Valve Replacement - PercutaneousRightTransfemoral Approach Edwards Sapien 3 UltraTHV (size75mm, model # [id number], serial [id number])  Co-Surgeons:Clarence H. Roxy Manns, MD and Sherren Mocha, MD  Anesthesiologist:John Lissa Hoard, MD  Echocardiographer:Peter Johnsie Cancel, MD  Pre-operative Echo Findings: ? Severe aortic stenosis ? Moderate-severeleft ventricular systolic dysfunction  Post-operative  Echo Findings: ? Noparavalvular leak ? Unchangedleft ventricular systolic function  __________________   Echo 10/22/18:  IMPRESSIONS 1. The left ventricle has moderate-severely reduced systolic function, with an ejection fraction of 30-35%. The cavity size was normal. There is mildly increased left ventricular wall thickness. Left ventricular diastolic Doppler parameters are indeterminate. Left ventricular diffuse hypokinesis. 2. The right ventricle has mildly reduced systolic function. The cavity was mildly enlarged. 3. Large pleural effusion in the left lateral region. 4. Small pericardial effusion. 5. The mitral valve is abnormal. Mild thickening of the mitral valve leaflet. There is mild mitral annular calcification present. 6. The tricuspid valve is grossly normal. 7. The aorta is normal in size and structure. 8. The inferior vena cava was dilated in size with >50% respiratory variability. 9. Moderate to severe global reduction in LV systolic function; mild LVH; s/p TAVR with no AI and mean gradient 16 mmHg; mild RVE with mildly reduced function; small pericardial effusion.   Patient Profile     68 y.o. male with a history of of ESRD on HD, AL amyloid (kidney involvement), HTN, HLD, GERD, anemia, thrombocytopenia, ischemic cardiomyopathy, chronic combined CHF, CAD, carotid artery disease, PAD w/ left femoral to below the knee popliteal bypass with synthetic graft, s/p toe amputations, left groin seroma, and severe AS who presented to [facility] on 10/17/18 with dyspnea and failure to thrive. He was admitted for HD and monitoring until his planned TAVR on 10/21/18.  Assessment & Plan    Severe AS:s/p successful TAVR with a 26 mm Edwards Sapien Ultra THV via the R TF approach on 10/21/18. Post operative echo showed persistent moderate to severe LV systolic dysfunction (EF 71-24%) with normally functioning TAVR with no AI and mean gradient 16 mmHg. There was also mild RVE with mildly  reduced function and a small pericardial effusion. ECG with old RBBB and no high grade heart block. Continue plavix. ASA stopped due to thrombocytopenia.  ESRD on HD (M,W,F): per nephrology. We are hopeful he will tolerate HD better now that his severe AS is fixed. Because of Lifevest placement and his home HD center needing to be properly trained, plans are for discharge after dialysis on Monday.  Acute on chronic combined S/D CHF: as evidenced by an extremely elevated BNP > 3000 on preadmission lab work and need to admission due to worsening dyspnea. Volume management per nephrology. Patient is anuric. GMDT limited by hypotension with HD.   CAD: pre TAVR cath showed severe single vessel CAD with chronic total occlusion of the RCA, collateralized with L--->R collaterals. Mild calcific nonobstructive LCA disease, anatomy unchanged from previous cath studies. No  PCI targets. Continue medical therapy with atorvastatin and Plavix (No ASA due to worsening thrombocytopenia).  PAD: has an appointment with Dr. Donzetta Matters later this month to discuss R LE bypass and possible drainage of L groin seroma   Thrombocytopenia: this is chronic, but platelets down to 33,000 today. ASA stopped on 8/14.  Polymorphic VT: Electrolytes are normal. QTc okay on ECG. He has not been on any BB given hypotension with HD. He has been started on amiodarone 400 mg BID and he now has a LifeVest. Training will be provided to his outpatient hemodialysis center.   Ischemic cardiomyopathy: as above, had LifeVest placed.    For questions or updates, please contact [place] Please consult [email or web address] for contact info under Cardiology/STEMI.      Signed, Kate Sable, MD  10/25/2018, 11:28 AM

## 2018-10-25 NOTE — Progress Notes (Signed)
[facility] Progress Note   Subjective:  Seen on HD today - DUF for fluid only.  NSR on monitor.  He feels well.   Objective Vitals:   10/25/18 0705 10/25/18 0730 10/25/18 0800 10/25/18 0830  BP: 108/61 (!) 113/59 122/66 (!) 112/58  Pulse: 66 63 60 61  Resp: 16 13 14 11   Temp:      TempSrc:      SpO2:      Weight:      Height:       Physical Exam General: Well developed, alert male, in NAD Heart: RRR, no murmurs, rubs or gallops. NSR seen on monitor Lungs: CTA bilaterally without wheezing, rhonchi or rales Abdomen: Soft, non-tender, non-distended, +BS Extremities: trace LE edema Dialysis Access:  RUE AVF + thrill  Additional Objective Labs: Basic Metabolic Panel: Recent Labs  Lab 10/19/18 1044  10/24/18 0319 10/24/18 1200 10/25/18 0340  NA 139   < > 136 136 139  K 4.0   < > 4.7 4.9 3.9  CL 97*   < > 98 98 98  CO2 22   < > 25 22 29   GLUCOSE 121*   < > 100* 109* 97  BUN 65*   < > 40* 42* 25*  CREATININE 10.77*   < > 8.63* 8.76* 5.87*  CALCIUM 9.2   < > 8.9 9.2 9.1  PHOS 6.9*  --   --  5.1*  --    < > = values in this interval not displayed.   Liver Function Tests: Recent Labs  Lab 10/19/18 1044 10/24/18 1200  ALBUMIN 3.6 3.3*   CBC: Recent Labs  Lab 10/20/18 0638  10/22/18 0208 10/23/18 0432 10/24/18 0319 10/24/18 1200 10/25/18 0340  WBC 4.2  --  4.5 5.2 3.8* 5.4 4.0  NEUTROABS 2.8  --   --   --   --   --   --   HGB 9.3*   < > 8.7* 8.4* 8.5* 9.1* 8.9*  HCT 29.1*   < > 27.9* 26.9* 27.0* 29.0* 27.7*  MCV 106.6*  --  109.4* 110.2* 107.6* 108.6* 107.4*  PLT 64*  --  51* 44* 35* 42* 33*   < > = values in this interval not displayed.   Blood Culture    Component Value Date/Time   SDES URINE, RANDOM 02/01/2016 1707   SPECREQUEST NONE 02/01/2016 1707   CULT (A) 02/01/2016 1707    10,000 COLONIES/mL PSEUDOMONAS AERUGINOSA 40,000 COLONIES/mL ENTEROBACTER AEROGENES    REPTSTATUS 02/04/2016 FINAL 02/01/2016 1707     Studies/Results: No results found. Medications: . sodium chloride    . sodium chloride    . sodium chloride    . nitroGLYCERIN    . phenylephrine (NEO-SYNEPHRINE) Adult infusion     . amiodarone  400 mg Oral BID  . atorvastatin  80 mg Oral Q supper  . Chlorhexidine Gluconate Cloth  6 each Topical Q0600  . clopidogrel  75 mg Oral Q supper  . darbepoetin (ARANESP) injection - DIALYSIS  60 mcg Intravenous Q Fri-HD  . gabapentin  300 mg Oral Daily  . midodrine  10 mg Oral Q M,W,F-HD  . multivitamin  1 tablet Oral QHS  . pantoprazole  40 mg Oral QPM  . sodium chloride flush  3 mL Intravenous Q12H  . sucroferric oxyhydroxide  1,000 mg Oral TID WC    Dialysis Orders: [facility] 4h 400/800 82.5kg 2/2 bath Hep 8000 RUE AVF Sensipar 180mg  PO TIW with dialysis Mircera 113mcg IV  q2weeks (last given 10/15/2018) Calcitriol 1.5 mcg PO TIW velphoro 3 tabs PO TID AC and 2/snack  Recent labs: Hgb 10.4, Tsat 29; Ferritin 1303; Plt 62K; K+ 4.6; Albumin 4.1; calcium 9.4; PTH 615   Assessment/Plan:  1.  Severe AS: s/p TAVR 8/11. Per cardiology.  Was having some ventricular arrhythmia (Bigeminy, per chart overnight some VT) post procedure, electrolytes ok.  Current plan is to d/c with life vest -- HD unit staff needs training prior to his being dialyzed there.  Likely will need to stay through weekend to accomodate that 2. ESRD:MWF.   DUF session today as >> EDW.  Physical exam improved.  Should be back to EDW after today's session.  Plan next HD Monday.  Has been hemodynamically stable on HD s/p TAVR.    3. Hypertension/volume:  BP controlled. Continue midodrine before dialysis. Per above extra UF today to achieve EDW.  4. Anemia:Hgb 9.3 > 8.7 > 8>8.9. Aranesp 86mcg with treatment 8/14.  Last iron level ok.  5. Metabolic bone disease:Calcium 9.1. Recent PTH elevated. Will continue sensipar and calcitriol. Phos 6.9- continue home binder. 6. Nutrition:Renal diet with fluid  restrictions 7. Thrombocytopenia: plt 33 this AM, is on aspirin and plavix. Has tolerated heparin with dialysis at outpatient unit, holding here.  Jannifer Hick MD [facility] Pager [phone]

## 2018-10-26 LAB — BASIC METABOLIC PANEL
Anion gap: 13 (ref 5–15)
BUN: 40 mg/dL — ABNORMAL HIGH (ref 8–23)
CO2: 27 mmol/L (ref 22–32)
Calcium: 9.4 mg/dL (ref 8.9–10.3)
Chloride: 98 mmol/L (ref 98–111)
Creatinine, Ser: 7.57 mg/dL — ABNORMAL HIGH (ref 0.61–1.24)
GFR calc Af Amer: 8 mL/min — ABNORMAL LOW (ref >60)
GFR calc non Af Amer: 7 mL/min — ABNORMAL LOW (ref >60)
Glucose, Bld: 104 mg/dL — ABNORMAL HIGH (ref 70–99)
Potassium: 4.4 mmol/L (ref 3.5–5.1)
Sodium: 138 mmol/L (ref 135–145)

## 2018-10-26 LAB — CBC
HCT: 29.3 % — ABNORMAL LOW (ref 39.0–52.0)
Hemoglobin: 9.3 g/dL — ABNORMAL LOW (ref 13.0–17.0)
MCH: 34.4 pg — ABNORMAL HIGH (ref 26.0–34.0)
MCHC: 31.7 g/dL (ref 30.0–36.0)
MCV: 108.5 fL — ABNORMAL HIGH (ref 80.0–100.0)
Platelets: 31 10*3/uL — ABNORMAL LOW (ref 150–400)
RBC: 2.7 MIL/uL — ABNORMAL LOW (ref 4.22–5.81)
RDW: 18.2 % — ABNORMAL HIGH (ref 11.5–15.5)
WBC: 4.4 10*3/uL (ref 4.0–10.5)
nRBC: 0 % (ref 0.0–0.2)

## 2018-10-26 NOTE — Progress Notes (Signed)
Progress Note  Patient Name: Jose Garrett Date of Encounter: 10/26/2018  Primary Cardiologist: Lauree Chandler, MD   Subjective   Denies chest pain and shortness of breath. About to walk in the hallway with nurse.  Inpatient Medications    Scheduled Meds:  amiodarone  400 mg Oral BID   atorvastatin  80 mg Oral Q supper   Chlorhexidine Gluconate Cloth  6 each Topical Q0600   clopidogrel  75 mg Oral Q supper   darbepoetin (ARANESP) injection - DIALYSIS  60 mcg Intravenous Q Fri-HD   gabapentin  300 mg Oral Daily   midodrine  10 mg Oral Q M,W,F-HD   multivitamin  1 tablet Oral QHS   pantoprazole  40 mg Oral QPM   sodium chloride flush  3 mL Intravenous Q12H   sucroferric oxyhydroxide  1,000 mg Oral TID WC   Continuous Infusions:  sodium chloride     nitroGLYCERIN     phenylephrine (NEO-SYNEPHRINE) Adult infusion     PRN Meds: sodium chloride, acetaminophen **OR** acetaminophen, LORazepam, morphine injection, ondansetron (ZOFRAN) IV, oxyCODONE, sodium chloride flush, traMADol   Vital Signs    Vitals:   10/25/18 2356 10/26/18 0416 10/26/18 0556 10/26/18 0806  BP: 121/71 (!) 111/57  117/67  Pulse: 81 72  65  Resp: 19 14 20 13   Temp: 98.4 F (36.9 C) 97.8 F (36.6 C)  (!) 97.5 F (36.4 C)  TempSrc: Oral Oral  Oral  SpO2: 97% 96%  94%  Weight:   82.5 kg   Height:        Intake/Output Summary (Last 24 hours) at 10/26/2018 0956 Last data filed at 10/26/2018 0948 Gross per 24 hour  Intake 400 ml  Output --  Net 400 ml   Filed Weights   10/25/18 0650 10/25/18 0929 10/26/18 0556  Weight: 85.5 kg 82.3 kg 82.5 kg    Telemetry    Sinus rhythm with PAC's and rare PVC's - Personally Reviewed  ECG    NA - Personally Reviewed  Physical Exam   GEN: No acute distress.   Neck: No JVD Cardiac: RRR, 2/6 ESM at RUSB, no rubs, or gallops.  Respiratory: Clear to auscultation bilaterally. GI: Soft, nontender, non-distended  MS: Chronic 2+ LLE and  trace RLE edema; No deformity. Neuro:  Nonfocal  Psych: Normal affect   Labs    Chemistry Recent Labs  Lab 10/19/18 1044  10/24/18 1200 10/25/18 0340 10/26/18 0330  NA 139   < > 136 139 138  K 4.0   < > 4.9 3.9 4.4  CL 97*   < > 98 98 98  CO2 22   < > 22 29 27   GLUCOSE 121*   < > 109* 97 104*  BUN 65*   < > 42* 25* 40*  CREATININE 10.77*   < > 8.76* 5.87* 7.57*  CALCIUM 9.2   < > 9.2 9.1 9.4  ALBUMIN 3.6  --  3.3*  --   --   GFRNONAA 4*   < > 6* 9* 7*  GFRAA 5*   < > 7* 11* 8*  ANIONGAP 20*   < > 16* 12 13   < > = values in this interval not displayed.     Hematology Recent Labs  Lab 10/24/18 1200 10/25/18 0340 10/26/18 0330  WBC 5.4 4.0 4.4  RBC 2.67* 2.58* 2.70*  HGB 9.1* 8.9* 9.3*  HCT 29.0* 27.7* 29.3*  MCV 108.6* 107.4* 108.5*  MCH 34.1* 34.5* 34.4*  MCHC  31.4 32.1 31.7  RDW 17.9* 17.8* 18.2*  PLT 42* 33* 31*    Cardiac EnzymesNo results for input(s): TROPONINI in the last 168 hours. No results for input(s): TROPIPOC in the last 168 hours.   BNPNo results for input(s): BNP, PROBNP in the last 168 hours.   DDimer No results for input(s): DDIMER in the last 168 hours.   Radiology    No results found.  Cardiac Studies   TAVR OPERATIVE NOTE   Date of Procedure:10/21/2018  Preoperative Diagnosis:Severe Aortic Stenosis   Postoperative Diagnosis:Same   Procedure:   Transcatheter Aortic Valve Replacement - PercutaneousRightTransfemoral Approach Edwards Sapien 3 UltraTHV (size61mm, model # [id number], serial [id number])  Co-Surgeons:Clarence H. Roxy Manns, MD and Sherren Mocha, MD  Anesthesiologist:John Lissa Hoard, MD  Echocardiographer:Peter Johnsie Cancel, MD  Pre-operative Echo Findings: ? Severe aortic stenosis ? Moderate-severeleft ventricular systolic dysfunction  Post-operative Echo Findings: ? Noparavalvular  leak ? Unchangedleft ventricular systolic function  __________________   Echo 10/22/18:  IMPRESSIONS 1. The left ventricle has moderate-severely reduced systolic function, with an ejection fraction of 30-35%. The cavity size was normal. There is mildly increased left ventricular wall thickness. Left ventricular diastolic Doppler parameters are indeterminate. Left ventricular diffuse hypokinesis. 2. The right ventricle has mildly reduced systolic function. The cavity was mildly enlarged. 3. Large pleural effusion in the left lateral region. 4. Small pericardial effusion. 5. The mitral valve is abnormal. Mild thickening of the mitral valve leaflet. There is mild mitral annular calcification present. 6. The tricuspid valve is grossly normal. 7. The aorta is normal in size and structure. 8. The inferior vena cava was dilated in size with >50% respiratory variability. 9. Moderate to severe global reduction in LV systolic function; mild LVH; s/p TAVR with no AI and mean gradient 16 mmHg; mild RVE with mildly reduced function; small pericardial effusion.  Patient Profile     68 y.o. male with a history of of ESRD on HD, AL amyloid (kidney involvement), HTN, HLD, GERD, anemia, thrombocytopenia, ischemic cardiomyopathy, chronic combined CHF, CAD, carotid artery disease, PAD w/ left femoral to below the knee popliteal bypass with synthetic graft, s/p toe amputations, left groin seroma, and severe AS who presented to [facility] on 10/17/18 with dyspnea and failure to thrive. He was admitted for HD and monitoring until his planned TAVR on 10/21/18.  Assessment & Plan    Severe AS:s/p successful TAVR with a 26 mm Edwards Sapien Ultra THV via the R TF approach on 10/21/18. Post operative echo showed persistent moderate to severe LV systolic dysfunction (EF 22-48%) with normally functioning TAVR with no AI and mean gradient 16 mmHg. There was also mild RVE with mildly reduced function and a small  pericardial effusion.ECG with old RBBB and no high grade heart block. Continue plavix.ASA stopped due to thrombocytopenia.  ESRD on HD (M,W,F):per nephrology.We are hopeful he will tolerate HD better now that his severe AS is fixed. Because of Lifevest placement and his home HDcenter needingto be properly trained, plans are for discharge after dialysis on Monday.  Acute on chronic combined S/D CHF: as evidenced by an extremely elevated BNP >3000 on preadmission lab work and need to admission due to worsening dyspnea. Volume management per nephrology. Patient is anuric. GMDT limited by hypotension with HD.   CAD: pre TAVR cath showed severe single vessel CAD with chronic total occlusion of the RCA, collateralized with L--->R collaterals. Mild calcific nonobstructive LCA disease, anatomy unchanged from previous cath studies. No PCI targets. Continue medical therapy with atorvastatin and Plavix (  No ASA due to worsening thrombocytopenia).  PAD: has an appointment with Dr. Donzetta Matters later this month to discuss R LE bypass and possible drainage of L groin seroma   Thrombocytopenia: this is chronic,but platelets down to 31,000today. ASA stopped on 8/14.  Polymorphic VT: Electrolytes are normal. QTc okay on ECG. He has not been on any BB given hypotension with HD. He has been started onamiodarone 400 mg BID andhe now has a LifeVest. Training will be provided to his outpatient hemodialysis center.  Ischemic cardiomyopathy: as above, had LifeVest placed.   For questions or updates, please contact [facility] Please consult [email or web address] for contact info under Cardiology/STEMI.      Signed, Kate Sable, MD  10/26/2018, 9:56 AM

## 2018-10-26 NOTE — Progress Notes (Signed)
[facility] Progress Note   Subjective:  Seen in room. Had short HD yesterday for volume. Net UF 3L.  Feels good. Denies CP, SOB.   Objective Vitals:   10/25/18 2356 10/26/18 0416 10/26/18 0556 10/26/18 0806  BP: 121/71 (!) 111/57  117/67  Pulse: 81 72  65  Resp: 19 14 20 13   Temp: 98.4 F (36.9 C) 97.8 F (36.6 C)  (!) 97.5 F (36.4 C)  TempSrc: Oral Oral  Oral  SpO2: 97% 96%  94%  Weight:   82.5 kg   Height:       Physical Exam General: Well developed, alert male, in NAD Heart: RRR, no murmurs, rubs or gallops. NSR seen on monitor Lungs: CTA bilaterally without wheezing, rhonchi or rales Abdomen: Soft, non-tender, non-distended, +BS Extremities: trace LE edema L>R Dialysis Access:  RUE AVF + thrill  Additional Objective Labs: Basic Metabolic Panel: Recent Labs  Lab 10/19/18 1044  10/24/18 1200 10/25/18 0340 10/26/18 0330  NA 139   < > 136 139 138  K 4.0   < > 4.9 3.9 4.4  CL 97*   < > 98 98 98  CO2 22   < > 22 29 27   GLUCOSE 121*   < > 109* 97 104*  BUN 65*   < > 42* 25* 40*  CREATININE 10.77*   < > 8.76* 5.87* 7.57*  CALCIUM 9.2   < > 9.2 9.1 9.4  PHOS 6.9*  --  5.1*  --   --    < > = values in this interval not displayed.   Liver Function Tests: Recent Labs  Lab 10/19/18 1044 10/24/18 1200  ALBUMIN 3.6 3.3*   CBC: Recent Labs  Lab 10/20/18 0638  10/23/18 0432 10/24/18 0319 10/24/18 1200 10/25/18 0340 10/26/18 0330  WBC 4.2   < > 5.2 3.8* 5.4 4.0 4.4  NEUTROABS 2.8  --   --   --   --   --   --   HGB 9.3*   < > 8.4* 8.5* 9.1* 8.9* 9.3*  HCT 29.1*   < > 26.9* 27.0* 29.0* 27.7* 29.3*  MCV 106.6*   < > 110.2* 107.6* 108.6* 107.4* 108.5*  PLT 64*   < > 44* 35* 42* 33* 31*   < > = values in this interval not displayed.   Blood Culture    Component Value Date/Time   SDES URINE, RANDOM 02/01/2016 1707   SPECREQUEST NONE 02/01/2016 1707   CULT (A) 02/01/2016 1707    10,000 COLONIES/mL PSEUDOMONAS AERUGINOSA 40,000 COLONIES/mL  ENTEROBACTER AEROGENES    REPTSTATUS 02/04/2016 FINAL 02/01/2016 1707    Studies/Results: No results found. Medications: . sodium chloride    . nitroGLYCERIN    . phenylephrine (NEO-SYNEPHRINE) Adult infusion     . amiodarone  400 mg Oral BID  . atorvastatin  80 mg Oral Q supper  . Chlorhexidine Gluconate Cloth  6 each Topical Q0600  . clopidogrel  75 mg Oral Q supper  . darbepoetin (ARANESP) injection - DIALYSIS  60 mcg Intravenous Q Fri-HD  . gabapentin  300 mg Oral Daily  . midodrine  10 mg Oral Q M,W,F-HD  . multivitamin  1 tablet Oral QHS  . pantoprazole  40 mg Oral QPM  . sodium chloride flush  3 mL Intravenous Q12H  . sucroferric oxyhydroxide  1,000 mg Oral TID WC    Dialysis Orders: [facility] 4h 400/800 82.5kg 2/2 bath Hep 8000 RUE AVF Sensipar 180mg  PO TIW with dialysis  Mircera 166mcg IV q2weeks (last given 10/15/2018) Calcitriol 1.5 mcg PO TIW velphoro 3 tabs PO TID AC and 2/snack  Recent labs: Hgb 10.4, Tsat 29; Ferritin 1303; Plt 62K; K+ 4.6; Albumin 4.1; calcium 9.4; PTH 615   Assessment/Plan:  1.  Severe AS: s/p TAVR 8/11 Per cardiology.  Was having some ventricular arrhythmia (Bigeminy, per chart overnight some VT) post procedure, electrolytes ok.  Current plan is to d/c with life vest -- HD unit staff needs training prior to his being dialyzed there.  Staying through weekend to accomodate that 2. ESRD:MWF.   Extra UF on HD yesterday. Back on schedule Monday.  Has been hemodynamically stable on HD s/p TAVR.    3. Hypertension/volume:  BP controlled. Continue midodrine before dialysis. Extra HD for UF yesterday. Now at EDW  4. Anemia:Hgb 9.3  Aranesp 49mcg with treatment 8/14.  Last iron level ok.  5. Metabolic bone disease:Calcium 9.4. Recent PTH elevated. Will continue sensipar and calcitriol.  Continue home binder. 6. Nutrition:Renal diet with fluid restrictions 7. Thrombocytopenia: plt 30s  Asprin held. On plavix. Has tolerated heparin  with dialysis at outpatient unit, holding here.  Lynnda Child PA-C [facility] Pager [phone] 10/26/2018,10:22 AM

## 2018-10-27 LAB — CBC
HCT: 29.7 % — ABNORMAL LOW (ref 39.0–52.0)
HCT: 31 % — ABNORMAL LOW (ref 39.0–52.0)
Hemoglobin: 9.4 g/dL — ABNORMAL LOW (ref 13.0–17.0)
Hemoglobin: 9.7 g/dL — ABNORMAL LOW (ref 13.0–17.0)
MCH: 34.3 pg — ABNORMAL HIGH (ref 26.0–34.0)
MCH: 34.3 pg — ABNORMAL HIGH (ref 26.0–34.0)
MCHC: 31.6 g/dL (ref 30.0–36.0)
MCHC: 31.3 g/dL (ref 30.0–36.0)
MCV: 108.4 fL — ABNORMAL HIGH (ref 80.0–100.0)
MCV: 109.5 fL — ABNORMAL HIGH (ref 80.0–100.0)
Platelets: 33 10*3/uL — ABNORMAL LOW (ref 150–400)
Platelets: 35 10*3/uL — ABNORMAL LOW (ref 150–400)
RBC: 2.74 MIL/uL — ABNORMAL LOW (ref 4.22–5.81)
RBC: 2.83 MIL/uL — ABNORMAL LOW (ref 4.22–5.81)
RDW: 18.4 % — ABNORMAL HIGH (ref 11.5–15.5)
RDW: 18.3 % — ABNORMAL HIGH (ref 11.5–15.5)
WBC: 5 10*3/uL (ref 4.0–10.5)
WBC: 5.1 10*3/uL (ref 4.0–10.5)
nRBC: 0 % (ref 0.0–0.2)
nRBC: 0 % (ref 0.0–0.2)

## 2018-10-27 LAB — BASIC METABOLIC PANEL
Anion gap: 15 (ref 5–15)
BUN: 54 mg/dL — ABNORMAL HIGH (ref 8–23)
CO2: 25 mmol/L (ref 22–32)
Calcium: 9.3 mg/dL (ref 8.9–10.3)
Chloride: 98 mmol/L (ref 98–111)
Creatinine, Ser: 9.3 mg/dL — ABNORMAL HIGH (ref 0.61–1.24)
GFR calc Af Amer: 6 mL/min — ABNORMAL LOW (ref >60)
GFR calc non Af Amer: 5 mL/min — ABNORMAL LOW (ref >60)
Glucose, Bld: 103 mg/dL — ABNORMAL HIGH (ref 70–99)
Potassium: 4.7 mmol/L (ref 3.5–5.1)
Sodium: 138 mmol/L (ref 135–145)

## 2018-10-27 LAB — RENAL FUNCTION PANEL
Albumin: 3.4 g/dL — ABNORMAL LOW (ref 3.5–5.0)
Anion gap: 16 — ABNORMAL HIGH (ref 5–15)
BUN: 54 mg/dL — ABNORMAL HIGH (ref 8–23)
CO2: 23 mmol/L (ref 22–32)
Calcium: 9.5 mg/dL (ref 8.9–10.3)
Chloride: 98 mmol/L (ref 98–111)
Creatinine, Ser: 9.21 mg/dL — ABNORMAL HIGH (ref 0.61–1.24)
GFR calc Af Amer: 6 mL/min — ABNORMAL LOW (ref >60)
GFR calc non Af Amer: 5 mL/min — ABNORMAL LOW (ref >60)
Glucose, Bld: 90 mg/dL (ref 70–99)
Phosphorus: 4.3 mg/dL (ref 2.5–4.6)
Potassium: 4.6 mmol/L (ref 3.5–5.1)
Sodium: 137 mmol/L (ref 135–145)

## 2018-10-27 MED ORDER — LIDOCAINE HCL (PF) 1 % IJ SOLN: 5.0000 mL | INTRAMUSCULAR | Status: DC | PRN

## 2018-10-27 MED ORDER — CALCITRIOL 0.25 MCG PO CAPS: 1.5000 ug | ORAL_CAPSULE | ORAL | Status: DC

## 2018-10-27 MED ORDER — ALTEPLASE 2 MG IJ SOLR: 2.0000 mg | Freq: Once | INTRAMUSCULAR | Status: DC | PRN

## 2018-10-27 MED ORDER — HEPARIN SODIUM (PORCINE) 1000 UNIT/ML DIALYSIS
1000.0000 [IU] | INTRAMUSCULAR | Status: DC | PRN
  Filled 2018-10-27: qty 1

## 2018-10-27 MED ORDER — MIDODRINE HCL 5 MG PO TABS
ORAL_TABLET | ORAL | Status: AC
  Filled 2018-10-27: qty 2

## 2018-10-27 MED ORDER — SENNOSIDES-DOCUSATE SODIUM 8.6-50 MG PO TABS: 1.0000 | ORAL_TABLET | Freq: Every day | ORAL | Status: DC | PRN

## 2018-10-27 MED ORDER — SODIUM CHLORIDE 0.9 % IV SOLN: 100.0000 mL | INTRAVENOUS | Status: DC | PRN

## 2018-10-27 MED ORDER — CINACALCET HCL 30 MG PO TABS: 180.0000 mg | ORAL_TABLET | ORAL | Status: DC

## 2018-10-27 MED ORDER — LIDOCAINE-PRILOCAINE 2.5-2.5 % EX CREA
1.0000 "application " | TOPICAL_CREAM | CUTANEOUS | Status: DC | PRN
  Filled 2018-10-27: qty 5

## 2018-10-27 MED ORDER — PENTAFLUOROPROP-TETRAFLUOROETH EX AERO: 1.0000 "application " | INHALATION_SPRAY | CUTANEOUS | Status: DC | PRN

## 2018-10-27 NOTE — Discharge Summary (Addendum)
[facility] VALVE TEAM  Discharge Summary    Patient ID: Jose Garrett MRN: [id number]; DOB: Jul 19, 1950  Admit date: 10/17/2018 Discharge date: 10/28/2018  Primary Care Provider: [name], MD  Primary Cardiologist: Dr. Angelena Form Dr. Burt Knack &Dr. Roxy Manns (TAVR)   Discharge Diagnoses    Principal Problem:   S/P TAVR (transcatheter aortic valve replacement) Active Problems:   Acute on chronic combined systolic and diastolic CHF (congestive heart failure) ([place])   Essential hypertension, benign   AL amyloidosis (HCC)   Anemia of chronic disease   ESRD (end stage renal disease) on dialysis ([place])   PAD (peripheral artery disease) (HCC)   Thrombocytopenia (HCC)   Coronary artery disease involving native coronary artery of native heart without angina pectoris   Anxiety   Polymorphic ventricular tachycardia (HCC)   Allergies No Known Allergies  Diagnostic Studies/Procedures    TAVR OPERATIVE NOTE   Date of Procedure:10/21/2018  Preoperative Diagnosis:Severe Aortic Stenosis   Postoperative Diagnosis:Same   Procedure:   Transcatheter Aortic Valve Replacement - PercutaneousRightTransfemoral Approach Edwards Sapien 3 UltraTHV (size63mm, model # [id number], serial [id number])  Co-Surgeons:Clarence H. Roxy Manns, MD and Sherren Mocha, MD  Anesthesiologist:John Lissa Hoard, MD  Echocardiographer:Peter Johnsie Cancel, MD  Pre-operative Echo Findings: ? Severe aortic stenosis ? Moderate-severeleft ventricular systolic dysfunction  Post-operative Echo Findings: ? Noparavalvular leak ? Unchangedleft ventricular systolic function  __________________   Echo 10/22/18: IMPRESSIONS 1. The left ventricle has moderate-severely reduced systolic function, with an ejection fraction of 30-35%. The cavity size was normal. There is  mildly increased left ventricular wall thickness. Left ventricular diastolic Doppler parameters are indeterminate. Left ventricular diffuse hypokinesis. 2. The right ventricle has mildly reduced systolic function. The cavity was mildly enlarged. 3. Large pleural effusion in the left lateral region. 4. Small pericardial effusion. 5. The mitral valve is abnormal. Mild thickening of the mitral valve leaflet. There is mild mitral annular calcification present. 6. The tricuspid valve is grossly normal. 7. The aorta is normal in size and structure. 8. The inferior vena cava was dilated in size with >50% respiratory variability. 9. Moderate to severe global reduction in LV systolic function; mild LVH; s/p TAVR with no AI and mean gradient 16 mmHg; mild RVE with mildly reduced function; small pericardial effusion.     History of Present Illness     Jose Vise Pooleis a 68 y.o.malewith a history of of ESRD on HD, AL amyloid (kidney involvement), HTN, HLD, GERD, anemia, thrombocytopenia, ischemic cardiomyopathy, chronic combined CHF, CAD, RBBB, carotid artery disease, PAD w/ left femoral to below the knee popliteal bypass with synthetic graft, s/p toe amputations, left groin seroma, and severe AS who presented to [facility] on 10/17/18 with dyspnea and failure to thrive. He was admitted for HD and monitoring until his planned TAVR on 10/21/18.  Patient's cardiac history dates back to 11/2017 when he was hospitalized with acute respiratory failure in the setting of hypertensive urgency. Troponin levels were elevated and EKG revealed diffuse ST segment depression. Cardiac catheterization revealed occlusion of the right coronary artery with nonobstructive disease in the left coronary territory. The right coronary artery could not be engaged. He was treated medically. Follow-up echocardiogram performed 12/19/17 revealed normal left ventricular systolic function with moderate concentric hypertrophy and  findings worrisome for possible amyloid infiltration. The aoritc valve was felt to be functionally bicuspid with moderate aortic stenosis. Patient remained stable until January of this year when he was hospitalized again with an exacerbation of congestive heart failure. Echocardiogram performed  at that time revealed what was felt to be significant drop in left ventricular ejection fraction to 45%. Transvalvular gradient across the aortic valve was unchanged. The patient was seen in follow-up by Dr. Angelena Form in February of this year but plans for further evaluation to consider TAVR were delayed because of COVID-19.In May the patient was hospitalized with critical limb ischemia involving his left lower extremity with 2 gangrenous toes. He was found to have bilateral chronic occlusion of the superficial femoral arteries as well as the left popliteal artery. He underwent left common femoral to below-knee popliteal artery bypass graft using synthetic Gore-Tex graft. Postoperatively, he suffered an acute exacerbation of chronic combined systolic and diastolic congestive heart failure with mildly positive troponins consistent with NSTEMI. Catheterization revealed no significant change in his coronary artery disease with chronic occlusion of the right coronary artery and no significant disease in the left coronary circulation. Echocardiogram revealed further drop in left ventricular systolic function with ejection fraction estimated 35 to 40%. There was at least moderate and probably severe aortic stenosis. Peak velocity across aortic valve measured as high as 3.7 m/s corresponding to mean transvalvular gradient estimated 30 mmHg and aortic valve area calculated 0.87 cm. The DVI was notably only 0.20. His subsequent recovery was slow and he later underwent amputation of his gangrenous toes. He had a prolonged recovery requiring inpatient rehab. He developed a large seroma in the left groin associated with his  surgical incision which has been treated conservatively. More recently he has been having problems tolerating dialysis with tendency to get hypotension towards the completion of dialysis treatments.  The patient has been evaluated by the multidisciplinary valve team and felt to have severe, symptomatic aortic stenosis and to be a suitable candidate for TAVR, which was set up for 10/21/18. However, the patient developed worsening dyspnea and presented to the hospital on 10/17/18 for evaluation and was admitted until TAVR.  Hospital Course     Consultants: none   Severe AS:s/p successful TAVR with a 26 mm Edwards Sapien Ultra THV via the R TF approach on 10/21/18. Post operative echo showed persistent moderate to severe LV systolic dysfunction (EF 41-32%) with normally functioning TAVR with no AI and mean gradient 16 mmHg. There was also mild RVE with mildly reduced function and a small pericardial effusion.ECG with old RBBB and no high grade heart block.Continueplavix.ASA stopped due tothrombocytopenia. I will see him back in the office in two weeks with labs.   ESRD on HD (M,W,F):per nephrology.We are hopeful he will tolerate HD better now that his severe AS is fixed. Because of Lifevest placement and his home HDcenterneedingto be properly trained,discharge was delayed. We now have approval by the hemodialysis center and he is okay to wear his lifevest to outpatient HD.  Acute on chronic combined S/D CHF: as evidenced by an extremely elevated BNP >3000 on preadmission lab work and need to admission due to worsening dyspnea. Volume management per nephrology. Patient is anuric. GMDT limited by hypotension with HD.   CAD: pre TAVR cath showed severe single vessel CAD with chronic total occlusion of the RCA, collateralized with L--->R collaterals. Mild calcific nonobstructive LCA disease, anatomy unchanged from previous cath studies. No PCI targets. Continue medical therapywith atorvastatin  and Plavix (No ASA due to worsening thrombocytopenia).  PAD: has an appointment with Dr. Donzetta Matters later this month to discuss R LE bypass and possible drainage of L groin seroma.  Thrombocytopenia: this is chronic,but platelets down to 31,000 postoperatively. Now starting to  recover and up to 35K.ASA stopped on 8/14. Will check a CBC when he comes back to the office early September.  Polymorphic VT: noted on telemetry. Electrolytes are normal. QTc okay on ECG. He has not been on any BB given hypotension with HD. He has been started onamiodarone 400 mg BID. I will decrease this to 200 mg BID at discharge. Tele reviewed and shows a lower PVC burden and no further NSVT. LifeVest has been placed and training will be provided to his outpatient hemodialysis center. Plan to discontinue LifeVest after 4-6 weeks when fully loaded on amiodarone.   Ischemic cardiomyopathy: as above,hadLifeVest placed. _____________  Discharge Vitals Blood pressure (!) 124/57, pulse 85, temperature 97.7 F (36.5 C), resp. rate 17, height 5\' 10"  (1.778 m), weight 82.6 kg, SpO2 91 %.  Filed Weights   10/27/18 0547 10/27/18 0722 10/27/18 1139  Weight: 84.6 kg 84.6 kg 82.6 kg    Labs & Radiologic Studies    CBC Recent Labs    10/27/18 0428 10/27/18 0914  WBC 5.0 5.1  HGB 9.4* 9.7*  HCT 29.7* 31.0*  MCV 108.4* 109.5*  PLT 33* 35*   Basic Metabolic Panel Recent Labs    10/27/18 0428 10/27/18 0908  NA 138 137  K 4.7 4.6  CL 98 98  CO2 25 23  GLUCOSE 103* 90  BUN 54* 54*  CREATININE 9.30* 9.21*  CALCIUM 9.3 9.5  PHOS  --  4.3   Liver Function Tests Recent Labs    10/27/18 0908  ALBUMIN 3.4*   No results for input(s): LIPASE, AMYLASE in the last 72 hours. Cardiac Enzymes No results for input(s): CKTOTAL, CKMB, CKMBINDEX, TROPONINI in the last 72 hours. BNP Invalid input(s): POCBNP D-Dimer No results for input(s): DDIMER in the last 72 hours. Hemoglobin A1C No results for input(s): HGBA1C  in the last 72 hours. Fasting Lipid Panel No results for input(s): CHOL, HDL, LDLCALC, TRIG, CHOLHDL, LDLDIRECT in the last 72 hours. Thyroid Function Tests No results for input(s): TSH, T4TOTAL, T3FREE, THYROIDAB in the last 72 hours.  Invalid input(s): FREET3 _____________  Dg Chest 2 View  Result Date: 10/16/2018 CLINICAL DATA:  Valve replacement shortness of breath with exertion EXAM: CHEST - 2 VIEW COMPARISON:  07/23/2018 FINDINGS: Mild cardiomegaly with prominent central pulmonary vessels. Tiny pleural effusions. No focal consolidation. Aortic atherosclerosis. No pneumothorax. IMPRESSION: 1. Cardiomegaly with tiny pleural effusions. Electronically Signed   By: Donavan Foil M.D.   On: 10/16/2018 15:39   Ct Coronary Morph W/cta Cor W/score W/ca W/cm &/or Wo/cm  Addendum Date: 09/30/2018   ADDENDUM REPORT: 09/30/2018 14:13 EXAM: OVER-READ INTERPRETATION  CT CHEST The following report is an over-read performed by radiologist Dr. Rebekah [facility] on 09/30/2018. This over-read does not include interpretation of cardiac or coronary anatomy or pathology. The coronary calcium score or cardiac CTA interpretation by the cardiologist is attached. COMPARISON:  None. FINDINGS: Extracardiac findings will be described under separate dictation for contemporaneously obtained CTA chest, abdomen and pelvis. IMPRESSION: Please see separate dictation for contemporaneously obtained CTA chest, abdomen and pelvis dated 09/30/2018 for full description of relevant extracardiac findings. Electronically Signed   By: Vinnie Langton M.D.   On: 09/30/2018 14:13   Result Date: 09/30/2018 CLINICAL DATA:  68 year old male with severe aortic stenosis being evaluated for a TAVR procedure. EXAM: Cardiac TAVR CT TECHNIQUE: The patient was scanned on a [employer]. A 120 kV retrospective scan was triggered in the descending thoracic aorta at 111 HU's.  Gantry rotation speed was 250 msecs and  collimation was .6 mm. No beta blockade or nitro were given. The 3D data set was reconstructed in 5% intervals of the R-R cycle. Systolic and diastolic phases were analyzed on a dedicated work station using MPR, MIP and VRT modes. The patient received 80 cc of contrast. FINDINGS: Aortic Valve: Trileaflet aortic valve with severely thickened and calcified leaflets with severely restricted leaflet opening and no calcifications extending into the LVOT. Aorta: Normal size, mild diffuse atherosclerotic plaque and calcifications and no dissection. Sinotubular Junction: 31 x 29 mm Ascending Thoracic Aorta: 31 x 30 mm Aortic Arch: 28 x 26 mm Descending Thoracic Aorta: 23 x 22 mm Sinus of Valsalva Measurements: Non-coronary: 34 mm Right -coronary: 33 mm Left -coronary: 35 mm Coronary Artery Height above Annulus: Left Main: 12 mm Right Coronary: 19 mm (RCA occluded) Virtual Basal Annulus Measurements: Maximum/Minimum Diameter: 28.6 x 24.1 mm Mean Diameter: 25.4 mm Perimeter: 81.5 mm Area: 508 mm2 Optimum Fluoroscopic Angle for Delivery: LAO 6 CAU 7. IMPRESSION: 1. Trileaflet aortic valve with severely thickened and calcified leaflets with severely restricted leaflet opening and no calcifications extending into the LVOT. Aortic valve calcium score 2859 consistent with severe aortic stenosis. Annular measurements suitable for delivery of a 26 mm Edwards-SAPIEN 3 valve. 2. Sufficient coronary to annulus distance. 3. Optimum Fluoroscopic Angle for Delivery:  LAO 6 CAU 7. 4. No thrombus in the left atrial appendage. Electronically Signed: By: Ena Dawley On: 09/30/2018 12:10   Dg Chest Port 1 View  Result Date: 10/21/2018 CLINICAL DATA:  Status post TAVR. EXAM: PORTABLE CHEST 1 VIEW COMPARISON:  Chest x-ray dated October 17, 2018. FINDINGS: New left internal jugular central venous catheter with tip in the mid SVC. Interval TAVR. Stable mild cardiomegaly. Atherosclerotic calcification of the aortic arch. Normal pulmonary  vascularity. No focal consolidation, pleural effusion, or pneumothorax. No acute osseous abnormality. IMPRESSION: 1. Interval TAVR.  No active disease. Electronically Signed   By: Titus Dubin M.D.   On: 10/21/2018 16:34   Dg Chest Portable 1 View  Result Date: 10/17/2018 CLINICAL DATA:  Intermittent chest pain with exertion. EXAM: PORTABLE CHEST 1 VIEW COMPARISON:  October 16, 2018 FINDINGS: No pneumothorax. Stable cardiomegaly. The hila and mediastinum are unremarkable. No pulmonary nodules or masses. No focal infiltrates. No overt edema. Mild pulmonary venous congestion not excluded. IMPRESSION: Cardiomegaly. Suspected mild pulmonary venous congestion. No other abnormalities. Electronically Signed   By: Dorise Bullion III M.D   On: 10/17/2018 16:52   Ct Angio Chest Aorta W &/or Wo Contrast  Result Date: 09/30/2018 CLINICAL DATA:  68 year old male with history of severe aortic stenosis. Preprocedural study prior to potential transcatheter aortic valve replacement (TAVR) procedure. EXAM: CT ANGIOGRAPHY CHEST, ABDOMEN AND PELVIS TECHNIQUE: Multidetector CT imaging through the chest, abdomen and pelvis was performed using the standard protocol during bolus administration of intravenous contrast. Multiplanar reconstructed images and MIPs were obtained and reviewed to evaluate the vascular anatomy. CONTRAST:  174mL OMNIPAQUE IOHEXOL 350 MG/ML SOLN COMPARISON:  PET-CT 10/14/2014. CT the abdomen and pelvis 09/08/2014. FINDINGS: CTA CHEST FINDINGS Cardiovascular: Heart size is enlarged with moderate left ventricular dilatation. There is no significant pericardial fluid, thickening or pericardial calcification. There is aortic atherosclerosis, as well as atherosclerosis of the great vessels of the mediastinum and the coronary arteries, including calcified atherosclerotic plaque in the left main, left anterior descending, left circumflex and right coronary arteries. Severe thickening calcification of the aortic  valve. Mild calcifications of the mitral  annulus. Mediastinum/Lymph Nodes: No pathologically enlarged mediastinal or hilar lymph nodes. Esophagus is unremarkable in appearance. No axillary lymphadenopathy. Lungs/Pleura: No suspicious appearing pulmonary nodules or masses are noted. No acute consolidative airspace disease. Moderate bilateral pleural effusions with some mild dependent subsegmental atelectasis. Mild diffuse bronchial wall thickening with mild centrilobular and paraseptal emphysema. Musculoskeletal/Soft Tissues: There are no aggressive appearing lytic or blastic lesions noted in the visualized portions of the skeleton. CTA ABDOMEN AND PELVIS FINDINGS Hepatobiliary: 2.3 cm low-attenuation lesion in segment 7 of the liver, increased in size compared to remote prior study from 2016, compatible with a slow early growing simple cyst. No other suspicious cystic or solid hepatic lesions. No intra or extrahepatic biliary ductal dilatation. Status post cholecystectomy. Pancreas: No pancreatic mass. No pancreatic ductal dilatation. No pancreatic or peripancreatic fluid or inflammatory changes. Spleen: Unremarkable. Adrenals/Urinary Tract: Moderate bilateral renal atrophy. Numerous subcentimeter low-attenuation lesions in the kidneys bilaterally, too small to characterize, but statistically likely to represent tiny cysts. Bilateral adrenal glands are normal in appearance. No hydroureteronephrosis. Urinary bladder is normal in appearance. Stomach/Bowel: Normal appearance of the stomach. No pathologic dilatation of small bowel or colon. Numerous colonic diverticulae are noted, particularly in the sigmoid colon, without surrounding inflammatory changes to suggest an acute diverticulitis at this time. Normal appendix. Vascular/Lymphatic: Aortic atherosclerosis with vascular findings and measurements pertinent to potential TAVR procedure, as detailed below. Extending from the medial aspect of the left common femoral  artery distally, immediately adjacent to the bifurcation, there is a 1.7 x 0.8 x 0.7 cm focal outpouching (axial image 199 of series 16 and coronal image 51 of series 18), compatible with a pseudoaneurysm. Anterior to the superficial femoral artery (axial image 204 of series 16) there is a large 5.6 x 7.6 x 8.1 cm low-intermediate attenuation lesion, likely to represent a resolving hematoma. Multiple prominent borderline enlarged retroperitoneal lymph nodes, nonspecific. Reproductive: Prostate gland and seminal vesicles are unremarkable in appearance. Other: No significant volume of ascites.  No pneumoperitoneum. Musculoskeletal: There are no aggressive appearing lytic or blastic lesions noted in the visualized portions of the skeleton. VASCULAR MEASUREMENTS PERTINENT TO TAVR: AORTA: Minimal Aortic Diameter-11 x 14 mm Severity of Aortic Calcification-severe RIGHT PELVIS: Right Common Iliac Artery - Minimal Diameter-6.6 x 5.2 mm Tortuosity - mild Calcification-severe Right External Iliac Artery - Minimal Diameter-5.4 x 5.9 mm Tortuosity - mild Calcification-moderate Right Common Femoral Artery - Minimal Diameter-6.1 x 5.3 mm Tortuosity - mild Calcification-moderate LEFT PELVIS: Left Common Iliac Artery - Minimal Diameter-7.0 x 7.0 mm Tortuosity - mild Calcification-severe Left External Iliac Artery - Minimal Diameter-7.5 x 6.7 mm Tortuosity - mild Calcification-mild Left Common Femoral Artery - Minimal Diameter-8.3 x 6.6 mm Tortuosity-mild Calcification-mild-to-moderate Review of the MIP images confirms the above findings. IMPRESSION: 1. Vascular findings and measurements pertinent to potential TAVR procedure, as detailed above. 2. Large low-intermediate attenuation fluid collection in the left inguinal region likely to represent a resolving hematoma. There is also a small pseudoaneurysm extending off the medial aspect of the distal left common femoral artery, as above. 3. Severe thickening calcification of the  aortic valve, compatible with the reported clinical history of severe aortic stenosis. 4. Cardiomegaly with moderate left ventricular dilatation. 5. Aortic atherosclerosis, in addition to left main and 3 vessel coronary artery disease. Please note that although the presence of coronary artery calcium documents the presence of coronary artery disease, the severity of this disease and any potential stenosis cannot be assessed on this non-gated CT examination. Assessment for potential risk  factor modification, dietary therapy or pharmacologic therapy may be warranted, if clinically indicated. 6. Moderate bilateral renal atrophy. 7. Colonic diverticulosis without evidence of acute diverticulitis at this time. 8. Additional incidental findings, as above. Electronically Signed   By: Vinnie Langton M.D.   On: 09/30/2018 14:13   Ct Angio Chest Aorta W &/or Wo Contrast  Result Date: 09/30/2018 CLINICAL DATA:  68 year old male with history of severe aortic stenosis. Preprocedural study prior to potential transcatheter aortic valve replacement (TAVR) procedure. EXAM: CT ANGIOGRAPHY CHEST, ABDOMEN AND PELVIS TECHNIQUE: Multidetector CT imaging through the chest, abdomen and pelvis was performed using the standard protocol during bolus administration of intravenous contrast. Multiplanar reconstructed images and MIPs were obtained and reviewed to evaluate the vascular anatomy. CONTRAST:  132mL OMNIPAQUE IOHEXOL 350 MG/ML SOLN COMPARISON:  PET-CT 10/14/2014. CT the abdomen and pelvis 09/08/2014. FINDINGS: CTA CHEST FINDINGS Cardiovascular: Heart size is enlarged with moderate left ventricular dilatation. There is no significant pericardial fluid, thickening or pericardial calcification. There is aortic atherosclerosis, as well as atherosclerosis of the great vessels of the mediastinum and the coronary arteries, including calcified atherosclerotic plaque in the left main, left anterior descending, left circumflex and right  coronary arteries. Severe thickening calcification of the aortic valve. Mild calcifications of the mitral annulus. Mediastinum/Lymph Nodes: No pathologically enlarged mediastinal or hilar lymph nodes. Esophagus is unremarkable in appearance. No axillary lymphadenopathy. Lungs/Pleura: No suspicious appearing pulmonary nodules or masses are noted. No acute consolidative airspace disease. Moderate bilateral pleural effusions with some mild dependent subsegmental atelectasis. Mild diffuse bronchial wall thickening with mild centrilobular and paraseptal emphysema. Musculoskeletal/Soft Tissues: There are no aggressive appearing lytic or blastic lesions noted in the visualized portions of the skeleton. CTA ABDOMEN AND PELVIS FINDINGS Hepatobiliary: 2.3 cm low-attenuation lesion in segment 7 of the liver, increased in size compared to remote prior study from 2016, compatible with a slow early growing simple cyst. No other suspicious cystic or solid hepatic lesions. No intra or extrahepatic biliary ductal dilatation. Status post cholecystectomy. Pancreas: No pancreatic mass. No pancreatic ductal dilatation. No pancreatic or peripancreatic fluid or inflammatory changes. Spleen: Unremarkable. Adrenals/Urinary Tract: Moderate bilateral renal atrophy. Numerous subcentimeter low-attenuation lesions in the kidneys bilaterally, too small to characterize, but statistically likely to represent tiny cysts. Bilateral adrenal glands are normal in appearance. No hydroureteronephrosis. Urinary bladder is normal in appearance. Stomach/Bowel: Normal appearance of the stomach. No pathologic dilatation of small bowel or colon. Numerous colonic diverticulae are noted, particularly in the sigmoid colon, without surrounding inflammatory changes to suggest an acute diverticulitis at this time. Normal appendix. Vascular/Lymphatic: Aortic atherosclerosis with vascular findings and measurements pertinent to potential TAVR procedure, as detailed  below. Extending from the medial aspect of the left common femoral artery distally, immediately adjacent to the bifurcation, there is a 1.7 x 0.8 x 0.7 cm focal outpouching (axial image 199 of series 16 and coronal image 51 of series 18), compatible with a pseudoaneurysm. Anterior to the superficial femoral artery (axial image 204 of series 16) there is a large 5.6 x 7.6 x 8.1 cm low-intermediate attenuation lesion, likely to represent a resolving hematoma. Multiple prominent borderline enlarged retroperitoneal lymph nodes, nonspecific. Reproductive: Prostate gland and seminal vesicles are unremarkable in appearance. Other: No significant volume of ascites.  No pneumoperitoneum. Musculoskeletal: There are no aggressive appearing lytic or blastic lesions noted in the visualized portions of the skeleton. VASCULAR MEASUREMENTS PERTINENT TO TAVR: AORTA: Minimal Aortic Diameter-11 x 14 mm Severity of Aortic Calcification-severe RIGHT PELVIS: Right Common Iliac  Artery - Minimal Diameter-6.6 x 5.2 mm Tortuosity - mild Calcification-severe Right External Iliac Artery - Minimal Diameter-5.4 x 5.9 mm Tortuosity - mild Calcification-moderate Right Common Femoral Artery - Minimal Diameter-6.1 x 5.3 mm Tortuosity - mild Calcification-moderate LEFT PELVIS: Left Common Iliac Artery - Minimal Diameter-7.0 x 7.0 mm Tortuosity - mild Calcification-severe Left External Iliac Artery - Minimal Diameter-7.5 x 6.7 mm Tortuosity - mild Calcification-mild Left Common Femoral Artery - Minimal Diameter-8.3 x 6.6 mm Tortuosity-mild Calcification-mild-to-moderate Review of the MIP images confirms the above findings. IMPRESSION: 1. Vascular findings and measurements pertinent to potential TAVR procedure, as detailed above. 2. Large low-intermediate attenuation fluid collection in the left inguinal region likely to represent a resolving hematoma. There is also a small pseudoaneurysm extending off the medial aspect of the distal left common  femoral artery, as above. 3. Severe thickening calcification of the aortic valve, compatible with the reported clinical history of severe aortic stenosis. 4. Cardiomegaly with moderate left ventricular dilatation. 5. Aortic atherosclerosis, in addition to left main and 3 vessel coronary artery disease. Please note that although the presence of coronary artery calcium documents the presence of coronary artery disease, the severity of this disease and any potential stenosis cannot be assessed on this non-gated CT examination. Assessment for potential risk factor modification, dietary therapy or pharmacologic therapy may be warranted, if clinically indicated. 6. Moderate bilateral renal atrophy. 7. Colonic diverticulosis without evidence of acute diverticulitis at this time. 8. Additional incidental findings, as above. Electronically Signed   By: Vinnie Langton M.D.   On: 09/30/2018 14:13   Ct Angio Abd/pel W/ And/or W/o  Result Date: 09/30/2018 CLINICAL DATA:  68 year old male with history of severe aortic stenosis. Preprocedural study prior to potential transcatheter aortic valve replacement (TAVR) procedure. EXAM: CT ANGIOGRAPHY CHEST, ABDOMEN AND PELVIS TECHNIQUE: Multidetector CT imaging through the chest, abdomen and pelvis was performed using the standard protocol during bolus administration of intravenous contrast. Multiplanar reconstructed images and MIPs were obtained and reviewed to evaluate the vascular anatomy. CONTRAST:  173mL OMNIPAQUE IOHEXOL 350 MG/ML SOLN COMPARISON:  PET-CT 10/14/2014. CT the abdomen and pelvis 09/08/2014. FINDINGS: CTA CHEST FINDINGS Cardiovascular: Heart size is enlarged with moderate left ventricular dilatation. There is no significant pericardial fluid, thickening or pericardial calcification. There is aortic atherosclerosis, as well as atherosclerosis of the great vessels of the mediastinum and the coronary arteries, including calcified atherosclerotic plaque in the left  main, left anterior descending, left circumflex and right coronary arteries. Severe thickening calcification of the aortic valve. Mild calcifications of the mitral annulus. Mediastinum/Lymph Nodes: No pathologically enlarged mediastinal or hilar lymph nodes. Esophagus is unremarkable in appearance. No axillary lymphadenopathy. Lungs/Pleura: No suspicious appearing pulmonary nodules or masses are noted. No acute consolidative airspace disease. Moderate bilateral pleural effusions with some mild dependent subsegmental atelectasis. Mild diffuse bronchial wall thickening with mild centrilobular and paraseptal emphysema. Musculoskeletal/Soft Tissues: There are no aggressive appearing lytic or blastic lesions noted in the visualized portions of the skeleton. CTA ABDOMEN AND PELVIS FINDINGS Hepatobiliary: 2.3 cm low-attenuation lesion in segment 7 of the liver, increased in size compared to remote prior study from 2016, compatible with a slow early growing simple cyst. No other suspicious cystic or solid hepatic lesions. No intra or extrahepatic biliary ductal dilatation. Status post cholecystectomy. Pancreas: No pancreatic mass. No pancreatic ductal dilatation. No pancreatic or peripancreatic fluid or inflammatory changes. Spleen: Unremarkable. Adrenals/Urinary Tract: Moderate bilateral renal atrophy. Numerous subcentimeter low-attenuation lesions in the kidneys bilaterally, too small to characterize, but statistically  likely to represent tiny cysts. Bilateral adrenal glands are normal in appearance. No hydroureteronephrosis. Urinary bladder is normal in appearance. Stomach/Bowel: Normal appearance of the stomach. No pathologic dilatation of small bowel or colon. Numerous colonic diverticulae are noted, particularly in the sigmoid colon, without surrounding inflammatory changes to suggest an acute diverticulitis at this time. Normal appendix. Vascular/Lymphatic: Aortic atherosclerosis with vascular findings and  measurements pertinent to potential TAVR procedure, as detailed below. Extending from the medial aspect of the left common femoral artery distally, immediately adjacent to the bifurcation, there is a 1.7 x 0.8 x 0.7 cm focal outpouching (axial image 199 of series 16 and coronal image 51 of series 18), compatible with a pseudoaneurysm. Anterior to the superficial femoral artery (axial image 204 of series 16) there is a large 5.6 x 7.6 x 8.1 cm low-intermediate attenuation lesion, likely to represent a resolving hematoma. Multiple prominent borderline enlarged retroperitoneal lymph nodes, nonspecific. Reproductive: Prostate gland and seminal vesicles are unremarkable in appearance. Other: No significant volume of ascites.  No pneumoperitoneum. Musculoskeletal: There are no aggressive appearing lytic or blastic lesions noted in the visualized portions of the skeleton. VASCULAR MEASUREMENTS PERTINENT TO TAVR: AORTA: Minimal Aortic Diameter-11 x 14 mm Severity of Aortic Calcification-severe RIGHT PELVIS: Right Common Iliac Artery - Minimal Diameter-6.6 x 5.2 mm Tortuosity - mild Calcification-severe Right External Iliac Artery - Minimal Diameter-5.4 x 5.9 mm Tortuosity - mild Calcification-moderate Right Common Femoral Artery - Minimal Diameter-6.1 x 5.3 mm Tortuosity - mild Calcification-moderate LEFT PELVIS: Left Common Iliac Artery - Minimal Diameter-7.0 x 7.0 mm Tortuosity - mild Calcification-severe Left External Iliac Artery - Minimal Diameter-7.5 x 6.7 mm Tortuosity - mild Calcification-mild Left Common Femoral Artery - Minimal Diameter-8.3 x 6.6 mm Tortuosity-mild Calcification-mild-to-moderate Review of the MIP images confirms the above findings. IMPRESSION: 1. Vascular findings and measurements pertinent to potential TAVR procedure, as detailed above. 2. Large low-intermediate attenuation fluid collection in the left inguinal region likely to represent a resolving hematoma. There is also a small pseudoaneurysm  extending off the medial aspect of the distal left common femoral artery, as above. 3. Severe thickening calcification of the aortic valve, compatible with the reported clinical history of severe aortic stenosis. 4. Cardiomegaly with moderate left ventricular dilatation. 5. Aortic atherosclerosis, in addition to left main and 3 vessel coronary artery disease. Please note that although the presence of coronary artery calcium documents the presence of coronary artery disease, the severity of this disease and any potential stenosis cannot be assessed on this non-gated CT examination. Assessment for potential risk factor modification, dietary therapy or pharmacologic therapy may be warranted, if clinically indicated. 6. Moderate bilateral renal atrophy. 7. Colonic diverticulosis without evidence of acute diverticulitis at this time. 8. Additional incidental findings, as above. Electronically Signed   By: Vinnie Langton M.D.   On: 09/30/2018 14:13   Ct Angio Abdomen Pelvis  W &/or Wo Contrast  Result Date: 09/30/2018 CLINICAL DATA:  68 year old male with history of severe aortic stenosis. Preprocedural study prior to potential transcatheter aortic valve replacement (TAVR) procedure. EXAM: CT ANGIOGRAPHY CHEST, ABDOMEN AND PELVIS TECHNIQUE: Multidetector CT imaging through the chest, abdomen and pelvis was performed using the standard protocol during bolus administration of intravenous contrast. Multiplanar reconstructed images and MIPs were obtained and reviewed to evaluate the vascular anatomy. CONTRAST:  142mL OMNIPAQUE IOHEXOL 350 MG/ML SOLN COMPARISON:  PET-CT 10/14/2014. CT the abdomen and pelvis 09/08/2014. FINDINGS: CTA CHEST FINDINGS Cardiovascular: Heart size is enlarged with moderate left ventricular dilatation. There is no  significant pericardial fluid, thickening or pericardial calcification. There is aortic atherosclerosis, as well as atherosclerosis of the great vessels of the mediastinum and the  coronary arteries, including calcified atherosclerotic plaque in the left main, left anterior descending, left circumflex and right coronary arteries. Severe thickening calcification of the aortic valve. Mild calcifications of the mitral annulus. Mediastinum/Lymph Nodes: No pathologically enlarged mediastinal or hilar lymph nodes. Esophagus is unremarkable in appearance. No axillary lymphadenopathy. Lungs/Pleura: No suspicious appearing pulmonary nodules or masses are noted. No acute consolidative airspace disease. Moderate bilateral pleural effusions with some mild dependent subsegmental atelectasis. Mild diffuse bronchial wall thickening with mild centrilobular and paraseptal emphysema. Musculoskeletal/Soft Tissues: There are no aggressive appearing lytic or blastic lesions noted in the visualized portions of the skeleton. CTA ABDOMEN AND PELVIS FINDINGS Hepatobiliary: 2.3 cm low-attenuation lesion in segment 7 of the liver, increased in size compared to remote prior study from 2016, compatible with a slow early growing simple cyst. No other suspicious cystic or solid hepatic lesions. No intra or extrahepatic biliary ductal dilatation. Status post cholecystectomy. Pancreas: No pancreatic mass. No pancreatic ductal dilatation. No pancreatic or peripancreatic fluid or inflammatory changes. Spleen: Unremarkable. Adrenals/Urinary Tract: Moderate bilateral renal atrophy. Numerous subcentimeter low-attenuation lesions in the kidneys bilaterally, too small to characterize, but statistically likely to represent tiny cysts. Bilateral adrenal glands are normal in appearance. No hydroureteronephrosis. Urinary bladder is normal in appearance. Stomach/Bowel: Normal appearance of the stomach. No pathologic dilatation of small bowel or colon. Numerous colonic diverticulae are noted, particularly in the sigmoid colon, without surrounding inflammatory changes to suggest an acute diverticulitis at this time. Normal appendix.  Vascular/Lymphatic: Aortic atherosclerosis with vascular findings and measurements pertinent to potential TAVR procedure, as detailed below. Extending from the medial aspect of the left common femoral artery distally, immediately adjacent to the bifurcation, there is a 1.7 x 0.8 x 0.7 cm focal outpouching (axial image 199 of series 16 and coronal image 51 of series 18), compatible with a pseudoaneurysm. Anterior to the superficial femoral artery (axial image 204 of series 16) there is a large 5.6 x 7.6 x 8.1 cm low-intermediate attenuation lesion, likely to represent a resolving hematoma. Multiple prominent borderline enlarged retroperitoneal lymph nodes, nonspecific. Reproductive: Prostate gland and seminal vesicles are unremarkable in appearance. Other: No significant volume of ascites.  No pneumoperitoneum. Musculoskeletal: There are no aggressive appearing lytic or blastic lesions noted in the visualized portions of the skeleton. VASCULAR MEASUREMENTS PERTINENT TO TAVR: AORTA: Minimal Aortic Diameter-11 x 14 mm Severity of Aortic Calcification-severe RIGHT PELVIS: Right Common Iliac Artery - Minimal Diameter-6.6 x 5.2 mm Tortuosity - mild Calcification-severe Right External Iliac Artery - Minimal Diameter-5.4 x 5.9 mm Tortuosity - mild Calcification-moderate Right Common Femoral Artery - Minimal Diameter-6.1 x 5.3 mm Tortuosity - mild Calcification-moderate LEFT PELVIS: Left Common Iliac Artery - Minimal Diameter-7.0 x 7.0 mm Tortuosity - mild Calcification-severe Left External Iliac Artery - Minimal Diameter-7.5 x 6.7 mm Tortuosity - mild Calcification-mild Left Common Femoral Artery - Minimal Diameter-8.3 x 6.6 mm Tortuosity-mild Calcification-mild-to-moderate Review of the MIP images confirms the above findings. IMPRESSION: 1. Vascular findings and measurements pertinent to potential TAVR procedure, as detailed above. 2. Large low-intermediate attenuation fluid collection in the left inguinal region likely to  represent a resolving hematoma. There is also a small pseudoaneurysm extending off the medial aspect of the distal left common femoral artery, as above. 3. Severe thickening calcification of the aortic valve, compatible with the reported clinical history of severe aortic stenosis. 4. Cardiomegaly with  moderate left ventricular dilatation. 5. Aortic atherosclerosis, in addition to left main and 3 vessel coronary artery disease. Please note that although the presence of coronary artery calcium documents the presence of coronary artery disease, the severity of this disease and any potential stenosis cannot be assessed on this non-gated CT examination. Assessment for potential risk factor modification, dietary therapy or pharmacologic therapy may be warranted, if clinically indicated. 6. Moderate bilateral renal atrophy. 7. Colonic diverticulosis without evidence of acute diverticulitis at this time. 8. Additional incidental findings, as above. Electronically Signed   By: Vinnie Langton M.D.   On: 09/30/2018 14:13   Disposition   Pt is being discharged home today in good condition.  Follow-up Plans & Appointments    Follow-up Information    Eileen Stanford, PA-C. Go on 11/12/2018.   Specialties: Cardiology, Radiology Why: @ 1:45pm for your echocardiogram followed by an appointment with Angelena Form PA-C Contact information: [place] [id number] [phone]            Discharge Medications   Allergies as of 10/28/2018   No Known Allergies     Medication List    STOP taking these medications   aspirin EC 81 MG tablet   metoprolol tartrate 50 MG tablet Commonly known as: Lopressor     TAKE these medications   acetaminophen 500 MG tablet Commonly known as: TYLENOL Take 500-1,000 mg by mouth daily as needed for moderate pain.   amiodarone 200 MG tablet Commonly known as: PACERONE Take 1 tablet (200 mg total) by mouth 2 (two) times daily.     atorvastatin 80 MG tablet Commonly known as: LIPITOR Take 1 tablet (80 mg total) by mouth daily with supper.   cetirizine 10 MG tablet Commonly known as: ZYRTEC Take 10 mg by mouth daily.   clopidogrel 75 MG tablet Commonly known as: PLAVIX Take 1 tablet (75 mg total) by mouth daily with supper.   gabapentin 300 MG capsule Commonly known as: NEURONTIN Take 1 capsule (300 mg total) by mouth every morning. What changed:   how much to take  when to take this  additional instructions   lidocaine 5 % Commonly known as: Lidoderm Place 1 patch onto the skin daily. Remove & Discard patch within 12 hours or as directed by MD   LORazepam 1 MG tablet Commonly known as: ATIVAN Take 1 mg by mouth 2 (two) times daily as needed for anxiety.   midodrine 10 MG tablet Commonly known as: PROAMATINE Take 1 tablet (10 mg total) by mouth every Monday, Wednesday, and Friday with hemodialysis. What changed:   when to take this  additional instructions   multivitamin Tabs tablet Take 1 tablet by mouth at bedtime.   mupirocin ointment 2 % Commonly known as: BACTROBAN Apply 1 application topically daily as needed (irritation).   oxyCODONE 5 MG immediate release tablet Commonly known as: Oxy IR/ROXICODONE Take 1-2 tablets (5-10 mg total) by mouth every 4 (four) hours as needed for moderate pain.   pantoprazole 40 MG tablet Commonly known as: PROTONIX Take 1 tablet (40 mg total) by mouth daily. What changed: when to take this   sucroferric oxyhydroxide 500 MG chewable tablet Commonly known as: VELPHORO Chew 2 tablets (1,000 mg total) by mouth 3 (three) times daily with meals. What changed:   how much to take  when to take this  additional instructions   vitamin B-12 1000 MCG tablet Commonly known as: CYANOCOBALAMIN Take 1,000 mcg by  mouth every evening.         Outstanding Labs/Studies   cbc  Duration of Discharge Encounter   Greater than 30 minutes including  physician time.  SignedAngelena Form, PA-C 10/28/2018, 11:25 AM [phone]

## 2018-10-27 NOTE — Progress Notes (Addendum)
Progress Note  Patient Name: Jose Garrett Date of Encounter: 10/27/2018  Primary Cardiologist: Lauree Chandler, MD   Subjective   Seen getting HD. Ready to go home. No complaints.   Inpatient Medications    Scheduled Meds: . amiodarone  400 mg Oral BID  . atorvastatin  80 mg Oral Q supper  . Chlorhexidine Gluconate Cloth  6 each Topical Q0600  . clopidogrel  75 mg Oral Q supper  . darbepoetin (ARANESP) injection - DIALYSIS  60 mcg Intravenous Q Fri-HD  . gabapentin  300 mg Oral Daily  . midodrine      . midodrine  10 mg Oral Q M,W,F-HD  . multivitamin  1 tablet Oral QHS  . pantoprazole  40 mg Oral QPM  . sodium chloride flush  3 mL Intravenous Q12H  . sucroferric oxyhydroxide  1,000 mg Oral TID WC   Continuous Infusions: . sodium chloride    . [START ON 10/28/2018] sodium chloride    . [START ON 10/28/2018] sodium chloride    . nitroGLYCERIN    . phenylephrine (NEO-SYNEPHRINE) Adult infusion     PRN Meds: sodium chloride, [START ON 10/28/2018] sodium chloride, [START ON 10/28/2018] sodium chloride, acetaminophen **OR** acetaminophen, [START ON 10/28/2018] alteplase, [START ON 10/28/2018] heparin, [START ON 10/28/2018] lidocaine (PF), [START ON 10/28/2018] lidocaine-prilocaine, LORazepam, morphine injection, ondansetron (ZOFRAN) IV, oxyCODONE, [START ON 10/28/2018] pentafluoroprop-tetrafluoroeth, sodium chloride flush, traMADol   Vital Signs    Vitals:   10/27/18 0900 10/27/18 0930 10/27/18 1000 10/27/18 1030  BP: (!) 143/69 (!) 162/85 131/64 104/68  Pulse: 68 71 68 67  Resp:      Temp:      TempSrc:      SpO2:      Weight:      Height:        Intake/Output Summary (Last 24 hours) at 10/27/2018 1050 Last data filed at 10/26/2018 2026 Gross per 24 hour  Intake 360 ml  Output -  Net 360 ml   Filed Weights   10/26/18 0556 10/27/18 0547 10/27/18 0722  Weight: 82.5 kg 84.6 kg 84.6 kg    Telemetry    Sinus rhythm with PAC's and rare PVC's - Personally Reviewed   ECG    NA - Personally Reviewed  Physical Exam   GEN: No acute distress. Getting HD   Neck: No JVD Cardiac: RRR, 2/6 SEM at RUSB, no rubs, or gallops.  Respiratory: Clear to auscultation bilaterally. GI: Soft, nontender, non-distended  MS: Chronic 2+ LLE and trace RLE edema; No deformity. Neuro:  Nonfocal  Psych: Normal affect   Labs    Chemistry Recent Labs  Lab 10/24/18 1200  10/26/18 0330 10/27/18 0428 10/27/18 0908  NA 136   < > 138 138 137  K 4.9   < > 4.4 4.7 4.6  CL 98   < > 98 98 98  CO2 22   < > 27 25 23   GLUCOSE 109*   < > 104* 103* 90  BUN 42*   < > 40* 54* 54*  CREATININE 8.76*   < > 7.57* 9.30* 9.21*  CALCIUM 9.2   < > 9.4 9.3 9.5  ALBUMIN 3.3*  --   --   --  3.4*  GFRNONAA 6*   < > 7* 5* 5*  GFRAA 7*   < > 8* 6* 6*  ANIONGAP 16*   < > 13 15 16*   < > = values in this interval not displayed.  Hematology Recent Labs  Lab 10/26/18 0330 10/27/18 0428 10/27/18 0914  WBC 4.4 5.0 5.1  RBC 2.70* 2.74* 2.83*  HGB 9.3* 9.4* 9.7*  HCT 29.3* 29.7* 31.0*  MCV 108.5* 108.4* 109.5*  MCH 34.4* 34.3* 34.3*  MCHC 31.7 31.6 31.3  RDW 18.2* 18.4* 18.3*  PLT 31* 33* 35*    Cardiac EnzymesNo results for input(s): TROPONINI in the last 168 hours. No results for input(s): TROPIPOC in the last 168 hours.   BNPNo results for input(s): BNP, PROBNP in the last 168 hours.   DDimer No results for input(s): DDIMER in the last 168 hours.   Radiology    No results found.  Cardiac Studies   TAVR OPERATIVE NOTE   Date of Procedure:10/21/2018  Preoperative Diagnosis:Severe Aortic Stenosis   Postoperative Diagnosis:Same   Procedure:   Transcatheter Aortic Valve Replacement - PercutaneousRightTransfemoral Approach Edwards Sapien 3 UltraTHV (size84mm, model # [id number], serial [id number])  Co-Surgeons:Clarence H. Roxy Manns, MD and Sherren Mocha, MD   Anesthesiologist:John Lissa Hoard, MD  Echocardiographer:Peter Johnsie Cancel, MD  Pre-operative Echo Findings: ? Severe aortic stenosis ? Moderate-severeleft ventricular systolic dysfunction  Post-operative Echo Findings: ? Noparavalvular leak ? Unchangedleft ventricular systolic function  __________________   Echo 10/22/18:  IMPRESSIONS 1. The left ventricle has moderate-severely reduced systolic function, with an ejection fraction of 30-35%. The cavity size was normal. There is mildly increased left ventricular wall thickness. Left ventricular diastolic Doppler parameters are indeterminate. Left ventricular diffuse hypokinesis. 2. The right ventricle has mildly reduced systolic function. The cavity was mildly enlarged. 3. Large pleural effusion in the left lateral region. 4. Small pericardial effusion. 5. The mitral valve is abnormal. Mild thickening of the mitral valve leaflet. There is mild mitral annular calcification present. 6. The tricuspid valve is grossly normal. 7. The aorta is normal in size and structure. 8. The inferior vena cava was dilated in size with >50% respiratory variability. 9. Moderate to severe global reduction in LV systolic function; mild LVH; s/p TAVR with no AI and mean gradient 16 mmHg; mild RVE with mildly reduced function; small pericardial effusion.  Patient Profile     68 y.o. male with a history of of ESRD on HD, AL amyloid (kidney involvement), HTN, HLD, GERD, anemia, thrombocytopenia, ischemic cardiomyopathy, chronic combined CHF, CAD, carotid artery disease, PAD w/ left femoral to below the knee popliteal bypass with synthetic graft, s/p toe amputations, left groin seroma, and severe AS who presented to [facility] on 10/17/18 with dyspnea and failure to thrive. He was admitted for HD and monitoring until his planned TAVR on 10/21/18.  Assessment & Plan    Severe AS:s/p successful TAVR with a 26 mm Edwards Sapien  Ultra THV via the R TF approach on 10/21/18. Post operative echo showed persistent moderate to severe LV systolic dysfunction (EF 54-27%) with normally functioning TAVR with no AI and mean gradient 16 mmHg. There was also mild RVE with mildly reduced function and a small pericardial effusion.ECG with old RBBB and no high grade heart block. Continue plavix.ASA stopped due to thrombocytopenia.  ESRD on HD (M,W,F):per nephrology.We are hopeful he will tolerate HD better now that his severe AS is fixed. BP has improved. Because of Lifevest placement and his home HDcenter needingto be properly trained, plans are for discharge today when approved by center   Acute on chronic combined S/D CHF: as evidenced by an extremely elevated BNP >3000 on preadmission lab work and need to admission due to worsening dyspnea. Volume management per nephrology. Patient is anuric. GMDT  limited by hypotension with HD previously.   CAD: pre TAVR cath showed severe single vessel CAD with chronic total occlusion of the RCA, collateralized with L--->R collaterals. Mild calcific nonobstructive LCA disease, anatomy unchanged from previous cath studies. No PCI targets. Continue medical therapy with atorvastatin and Plavix (No ASA due to worsening thrombocytopenia).  PAD: has an appointment with Dr. Donzetta Matters later this month to discuss R LE bypass and possible drainage of L groin seroma   Thrombocytopenia: this is chronic,but platelets down to 31,000. Starting to recover and up to 35,000. ASA stopped on 8/14.  Polymorphic VT: Electrolytes are normal. QTc okay on ECG. He has not been on any BB given hypotension with HD. He has been started onamiodarone 400 mg BID andhe now has a LifeVest. Will taper amio as an outpatient. Consider adding a low dose BB now that BPs have improved. Training for lifevest will be provided to his outpatient hemodialysis center.  Ischemic cardiomyopathy: as above, had LifeVest placed.       Signed, Angelena Form, PA-C  10/27/2018, 10:50 AM    I have personally seen and examined this patient. I agree with the assessment and plan as outlined above.  He is doing well post TAVR. No events overnight. On HD this am. No complaints. Will d/c on amiodarone and with a Lifevest. Possible d/c today if we can complete training for the Lifevest at the HD center.   Lauree Chandler 10/27/2018 11:03 AM

## 2018-10-27 NOTE — Progress Notes (Signed)
Renal Navigator continues to await approval of LifeVest by [facility]. All records were submitted for review on 10/24/18. Patient can return to the OP HD clinic/[facility] when approved by [facility]. Per clinic manager, this approval has not come yet at this time, but she assures Renal Navigator that she will notify Renal Navigator as soon as it does.   Alphonzo Cruise, [place] Renal Navigator [phone]

## 2018-10-27 NOTE — Care Management Important Message (Signed)
Important Message  Patient Details  Name: Jose Garrett MRN: [id number] Date of Birth: [birth year]   Medicare Important Message Given:  Yes     [name] 10/27/2018, 1:15 PM

## 2018-10-27 NOTE — Progress Notes (Addendum)
Subjective:  Seen on hd  Tolerating uf  bp 154/69 ,2.8 uf goal awaiting outpt HD unit staff to receive life vest training. With [facility] RN in [facility] monitoring  closely = "we think they will be ready Wednesday "   Objective Vital signs in last 24 hours: Vitals:   10/27/18 0830 10/27/18 0900 10/27/18 0930 10/27/18 1000  BP: 134/69 (!) 143/69 (!) 162/85 131/64  Pulse: 65 68 71 68  Resp:      Temp:      TempSrc:      SpO2:      Weight:      Height:       Weight change: 2.25 kg  Physical Exam General: on Hd  Alert  male, in NAD Heart: RRR, no murmurs, rubs or gallops. NSR seen on monitor Lungs: CTA bilaterally without wheezing, rhonchi or rales Abdomen: Soft, non-tender, non-distended, +BS Extremities: 1+ LE edema L>R Dialysis Access:  RUE AVF patent on HD    OP Dialysis Orders: [facility] 4h 400/800 82.5kg 2/2 bath Hep 8000 RUE AVF Sensipar 180mg  PO TIW with dialysis Mircera 140mcg IV q2weeks (last given 10/15/2018) Calcitriol 1.5 mcg PO TIW velphoro 3 tabs PO TID AC and 2/snack  Recent OP  labs: Hgb 10.4, Tsat 29; Ferritin 1303; Plt 62K; K+ 4.6; Albumin 4.1; calcium 9.4; PTH 615   Problem/Plan:  1. Severe AS:s/p TAVR 8/11 Per cardiology.  Current plan is to d/c with life vest -- HD unit staff needs training prior to his being dialyzed there.  CANNOT HAVE OP HD Until this is done  2. ESRD:MWF. Extra UF on HD sat . Back on schedule today .  Has been hemodynamically stable on HD s/p TAVR.    3. Hypertension/volume: BP controlled. Continue midodrine before dialysis. Extra HD for UF sat  Has some pedal edema tolerating uf now . 2.1kg > EDW this am  4. Anemia:Hgb 9.7 Aranesp 6mcg with treatment 8/14. Last iron level ok.  5. Metabolic bone disease:Calcium/ phos ok  Recent PTH elevated. continue sensipar and calcitriol.  Continue home binder. 6. Nutrition:Renal diet with fluid restrictions 7. Thrombocytopenia:plt 4s  Asprin held. On plavix. Has tolerated  heparin with dialysis at outpatient unit, holding here.   Ernest Haber, PA-C [place] [phone] 10/27/2018,10:35 AM  LOS: 10 days   Pt seen, examined and agree w A/P as above.  [name]  MD 10/27/2018, 11:44 AM    Labs: Basic Metabolic Panel: Recent Labs  Lab 10/24/18 1200  10/26/18 0330 10/27/18 0428 10/27/18 0908  NA 136   < > 138 138 137  K 4.9   < > 4.4 4.7 4.6  CL 98   < > 98 98 98  CO2 22   < > 27 25 23   GLUCOSE 109*   < > 104* 103* 90  BUN 42*   < > 40* 54* 54*  CREATININE 8.76*   < > 7.57* 9.30* 9.21*  CALCIUM 9.2   < > 9.4 9.3 9.5  PHOS 5.1*  --   --   --  4.3   < > = values in this interval not displayed.   Liver Function Tests: Recent Labs  Lab 10/24/18 1200 10/27/18 0908  ALBUMIN 3.3* 3.4*   No results for input(s): LIPASE, AMYLASE in the last 168 hours. No results for input(s): AMMONIA in the last 168 hours. CBC: Recent Labs  Lab 10/24/18 1200 10/25/18 0340 10/26/18 0330 10/27/18 0428 10/27/18 0914  WBC 5.4 4.0 4.4  5.0 5.1  HGB 9.1* 8.9* 9.3* 9.4* 9.7*  HCT 29.0* 27.7* 29.3* 29.7* 31.0*  MCV 108.6* 107.4* 108.5* 108.4* 109.5*  PLT 42* 33* 31* 33* 35*   Cardiac Enzymes: No results for input(s): CKTOTAL, CKMB, CKMBINDEX, TROPONINI in the last 168 hours. CBG: No results for input(s): GLUCAP in the last 168 hours.  Studies/Results: No results found. Medications: . sodium chloride    . [START ON 10/28/2018] sodium chloride    . [START ON 10/28/2018] sodium chloride    . nitroGLYCERIN    . phenylephrine (NEO-SYNEPHRINE) Adult infusion     . amiodarone  400 mg Oral BID  . atorvastatin  80 mg Oral Q supper  . Chlorhexidine Gluconate Cloth  6 each Topical Q0600  . clopidogrel  75 mg Oral Q supper  . darbepoetin (ARANESP) injection - DIALYSIS  60 mcg Intravenous Q Fri-HD  . gabapentin  300 mg Oral Daily  . midodrine      . midodrine  10 mg Oral Q M,W,F-HD  . multivitamin  1 tablet Oral QHS  . pantoprazole  40  mg Oral QPM  . sodium chloride flush  3 mL Intravenous Q12H  . sucroferric oxyhydroxide  1,000 mg Oral TID WC

## 2018-10-27 NOTE — Progress Notes (Signed)
CARDIAC REHAB PHASE I   Offered to walk with pt. Pt declining at this time stating he is constipated and needs to have a BM prior to ambulation. Pt looks much steadier on feet moving around the room compared to previous visits. Encouraged ambulation as able.  [id number] Rufina Falco, RN BSN 10/27/2018 2:35 PM

## 2018-10-28 DIAGNOSIS — I5031 Acute diastolic (congestive) heart failure: Secondary | ICD-10-CM

## 2018-10-28 MED ORDER — AMIODARONE HCL 200 MG PO TABS: 200.0000 mg | ORAL_TABLET | Freq: Two times a day (BID) | ORAL | 1 refills | Status: DC

## 2018-10-28 NOTE — Progress Notes (Signed)
Subjective:  Tolerated HD yesterday on schedule , noted ambulating  In hall , And OP kid center has okayed Life vest use at op kid center /for dc today   Objective Vital signs in last 24 hours: Vitals:   10/27/18 2103 10/27/18 2350 10/28/18 0639 10/28/18 0813  BP: 123/77 (!) 113/46 119/64 (!) 124/57  Pulse: 78 (!) 56 66 85  Resp: 16 20 11 17   Temp: 98.7 F (37.1 C) 98.4 F (36.9 C) 97.9 F (36.6 C) 97.7 F (36.5 C)  TempSrc: Oral Oral Oral   SpO2: 97% 93% 95% 91%  Weight:      Height:       Weight change: 0.05 kg  Physical Exam General: on Hd  Alert  male Heart: RRR, no murmurs, rubs or gallops. NSR seen on monitor Lungs: CTA bilaterally without wheezing, rhonchi or rales Abdomen: Soft, non-tender, non-distended, +BS Extremities:trace LE edemaL>R Dialysis Access: RUE AVF pos bruit   OP Dialysis Orders: [facility] 4h 400/800 82.5kg 2/2 bath Hep 8000 RUE AVF Sensipar 180mg  PO TIW with dialysis Mircera 166mcg IV q2weeks (last given 10/15/2018) Calcitriol 1.5 mcg PO TIW velphoro 3 tabs PO TID AC and 2/snack  Recent OP  labs: Hgb 10.4, Tsat 29; Ferritin 1303; Plt 62K; K+ 4.6; Albumin 4.1; calcium 9.4; PTH 615   Problem/Plan: 1. Severe AS:s/p TAVR 8/11 Per cardiology. Current plan is to d/c with life vest -- HD unit staff has been okayed for use . DC today   2. ESRD:MWF.Extra UF on HD sat . Back on schedule yest .Has been hemodynamically stable on HD s/p TAVR.  3. Hypertension/volume: BP controlled. Continue midodrine before dialysis.Extra HD for UF sat. Hd yest. With 2.3 l uf to his edw now    4. Anemia:Hgb 9.7Aranesp 102mcg with treatment 8/14. Last iron level ok.  5. Metabolic bone disease:Calcium/ phos ok  Recent PTH elevated. continue sensipar and calcitriol.Continue home binder. 6. Nutrition:Renal diet with fluid restrictions 7. Thrombocytopenia:plt30s Asprin held. Onplavix. Has tolerated heparin with dialysis at outpatient  unit, holding here  Ernest Haber, PA-C [place] [phone] 10/28/2018,1:13 PM  LOS: 11 days   Labs: Basic Metabolic Panel: Recent Labs  Lab 10/24/18 1200  10/26/18 0330 10/27/18 0428 10/27/18 0908  NA 136   < > 138 138 137  K 4.9   < > 4.4 4.7 4.6  CL 98   < > 98 98 98  CO2 22   < > 27 25 23   GLUCOSE 109*   < > 104* 103* 90  BUN 42*   < > 40* 54* 54*  CREATININE 8.76*   < > 7.57* 9.30* 9.21*  CALCIUM 9.2   < > 9.4 9.3 9.5  PHOS 5.1*  --   --   --  4.3   < > = values in this interval not displayed.   Liver Function Tests: Recent Labs  Lab 10/24/18 1200 10/27/18 0908  ALBUMIN 3.3* 3.4*   No results for input(s): LIPASE, AMYLASE in the last 168 hours. No results for input(s): AMMONIA in the last 168 hours. CBC: Recent Labs  Lab 10/24/18 1200 10/25/18 0340 10/26/18 0330 10/27/18 0428 10/27/18 0914  WBC 5.4 4.0 4.4 5.0 5.1  HGB 9.1* 8.9* 9.3* 9.4* 9.7*  HCT 29.0* 27.7* 29.3* 29.7* 31.0*  MCV 108.6* 107.4* 108.5* 108.4* 109.5*  PLT 42* 33* 31* 33* 35*   Cardiac Enzymes: No results for input(s): CKTOTAL, CKMB, CKMBINDEX, TROPONINI in the last 168 hours. CBG: No results for  input(s): GLUCAP in the last 168 hours.  Studies/Results: No results found. Medications: . sodium chloride    . nitroGLYCERIN    . phenylephrine (NEO-SYNEPHRINE) Adult infusion     . amiodarone  400 mg Oral BID  . atorvastatin  80 mg Oral Q supper  . [START ON 10/29/2018] calcitRIOL  1.5 mcg Oral Q M,W,F-HD  . Chlorhexidine Gluconate Cloth  6 each Topical Q0600  . [START ON 10/29/2018] cinacalcet  180 mg Oral Q M,W,F-HD  . clopidogrel  75 mg Oral Q supper  . darbepoetin (ARANESP) injection - DIALYSIS  60 mcg Intravenous Q Fri-HD  . gabapentin  300 mg Oral Daily  . midodrine  10 mg Oral Q M,W,F-HD  . multivitamin  1 tablet Oral QHS  . pantoprazole  40 mg Oral QPM  . sodium chloride flush  3 mL Intravenous Q12H  . sucroferric oxyhydroxide  1,000 mg Oral TID WC

## 2018-10-28 NOTE — Progress Notes (Signed)
Renal Navigator received notification from [name] at OP HD clinic/[facility] that they have been granted approval for patient to return to clinic for HD treatment by [facility] and that he should be instructed to have his lifevest on every time he goes to OP HD treatment for as long as he is instructed to wear this device by his Cardiologist.  Renal Navigator notified Cardiology PA/K. [place] Silos and met with patient to inform.  Jose Garrett, [name] Renal Navigator [phone]

## 2018-10-28 NOTE — Progress Notes (Addendum)
Progress Note  Patient Name: Jose Garrett Date of Encounter: 10/28/2018  Primary Cardiologist: Lauree Chandler, MD   Subjective   Doing well. No complaints. Feeling better. Eager to go home.  Inpatient Medications    Scheduled Meds: . amiodarone  400 mg Oral BID  . atorvastatin  80 mg Oral Q supper  . [START ON 10/29/2018] calcitRIOL  1.5 mcg Oral Q M,W,F-HD  . Chlorhexidine Gluconate Cloth  6 each Topical Q0600  . [START ON 10/29/2018] cinacalcet  180 mg Oral Q M,W,F-HD  . clopidogrel  75 mg Oral Q supper  . darbepoetin (ARANESP) injection - DIALYSIS  60 mcg Intravenous Q Fri-HD  . gabapentin  300 mg Oral Daily  . midodrine  10 mg Oral Q M,W,F-HD  . multivitamin  1 tablet Oral QHS  . pantoprazole  40 mg Oral QPM  . sodium chloride flush  3 mL Intravenous Q12H  . sucroferric oxyhydroxide  1,000 mg Oral TID WC   Continuous Infusions: . sodium chloride    . nitroGLYCERIN    . phenylephrine (NEO-SYNEPHRINE) Adult infusion     PRN Meds: sodium chloride, acetaminophen **OR** acetaminophen, LORazepam, morphine injection, ondansetron (ZOFRAN) IV, oxyCODONE, senna-docusate, sodium chloride flush, traMADol   Vital Signs    Vitals:   10/27/18 2103 10/27/18 2350 10/28/18 0639 10/28/18 0813  BP: 123/77 (!) 113/46 119/64 (!) 124/57  Pulse: 78 (!) 56 66 85  Resp: 16 20 11 17   Temp: 98.7 F (37.1 C) 98.4 F (36.9 C) 97.9 F (36.6 C) 97.7 F (36.5 C)  TempSrc: Oral Oral Oral   SpO2: 97% 93% 95% 91%  Weight:      Height:        Intake/Output Summary (Last 24 hours) at 10/28/2018 0835 Last data filed at 10/28/2018 0626 Gross per 24 hour  Intake 720 ml  Output 2300 ml  Net -1580 ml   Filed Weights   10/27/18 0547 10/27/18 0722 10/27/18 1139  Weight: 84.6 kg 84.6 kg 82.6 kg    Telemetry    Sinus rhythm with less frequent PVCs and NSVT - Personally Reviewed  ECG    NA - Personally Reviewed  Physical Exam   GEN: No acute distress. Getting HD   Neck: No JVD  Cardiac: RRR, 2/6 SEM at RUSB, no rubs, or gallops.  Respiratory: Clear to auscultation bilaterally. GI: Soft, nontender, non-distended  MS: Chronic 2+ LLE and trace RLE edema; No deformity. Neuro:  Nonfocal  Psych: Normal affect   Labs    Chemistry Recent Labs  Lab 10/24/18 1200  10/26/18 0330 10/27/18 0428 10/27/18 0908  NA 136   < > 138 138 137  K 4.9   < > 4.4 4.7 4.6  CL 98   < > 98 98 98  CO2 22   < > 27 25 23   GLUCOSE 109*   < > 104* 103* 90  BUN 42*   < > 40* 54* 54*  CREATININE 8.76*   < > 7.57* 9.30* 9.21*  CALCIUM 9.2   < > 9.4 9.3 9.5  ALBUMIN 3.3*  --   --   --  3.4*  GFRNONAA 6*   < > 7* 5* 5*  GFRAA 7*   < > 8* 6* 6*  ANIONGAP 16*   < > 13 15 16*   < > = values in this interval not displayed.     Hematology Recent Labs  Lab 10/26/18 0330 10/27/18 0428 10/27/18 0914  WBC 4.4 5.0 5.1  RBC 2.70* 2.74* 2.83*  HGB 9.3* 9.4* 9.7*  HCT 29.3* 29.7* 31.0*  MCV 108.5* 108.4* 109.5*  MCH 34.4* 34.3* 34.3*  MCHC 31.7 31.6 31.3  RDW 18.2* 18.4* 18.3*  PLT 31* 33* 35*    Cardiac EnzymesNo results for input(s): TROPONINI in the last 168 hours. No results for input(s): TROPIPOC in the last 168 hours.   BNPNo results for input(s): BNP, PROBNP in the last 168 hours.   DDimer No results for input(s): DDIMER in the last 168 hours.   Radiology    No results found.  Cardiac Studies   TAVR OPERATIVE NOTE   Date of Procedure:10/21/2018  Preoperative Diagnosis:Severe Aortic Stenosis   Postoperative Diagnosis:Same   Procedure:   Transcatheter Aortic Valve Replacement - PercutaneousRightTransfemoral Approach Edwards Sapien 3 UltraTHV (size32mm, model # [id number], serial [id number])  Co-Surgeons:Clarence H. Roxy Manns, MD and Sherren Mocha, MD  Anesthesiologist:John Lissa Hoard, MD  Echocardiographer:Peter Johnsie Cancel, MD  Pre-operative Echo  Findings: ? Severe aortic stenosis ? Moderate-severeleft ventricular systolic dysfunction  Post-operative Echo Findings: ? Noparavalvular leak ? Unchangedleft ventricular systolic function  __________________   Echo 10/22/18:  IMPRESSIONS 1. The left ventricle has moderate-severely reduced systolic function, with an ejection fraction of 30-35%. The cavity size was normal. There is mildly increased left ventricular wall thickness. Left ventricular diastolic Doppler parameters are indeterminate. Left ventricular diffuse hypokinesis. 2. The right ventricle has mildly reduced systolic function. The cavity was mildly enlarged. 3. Large pleural effusion in the left lateral region. 4. Small pericardial effusion. 5. The mitral valve is abnormal. Mild thickening of the mitral valve leaflet. There is mild mitral annular calcification present. 6. The tricuspid valve is grossly normal. 7. The aorta is normal in size and structure. 8. The inferior vena cava was dilated in size with >50% respiratory variability. 9. Moderate to severe global reduction in LV systolic function; mild LVH; s/p TAVR with no AI and mean gradient 16 mmHg; mild RVE with mildly reduced function; small pericardial effusion.  Patient Profile     68 y.o. male with a history of of ESRD on HD, AL amyloid (kidney involvement), HTN, HLD, GERD, anemia, thrombocytopenia, ischemic cardiomyopathy, chronic combined CHF, CAD, carotid artery disease, PAD w/ left femoral to below the knee popliteal bypass with synthetic graft, s/p toe amputations, left groin seroma, and severe AS who presented to [facility] on 10/17/18 with dyspnea and failure to thrive. He was admitted for HD and monitoring until his planned TAVR on 10/21/18.  Assessment & Plan    Severe AS:s/p successful TAVR with a 26 mm Edwards Sapien Ultra THV via the R TF approach on 10/21/18. Post operative echo showed persistent moderate to severe LV systolic dysfunction (EF  30-35%) with normally functioning TAVR with no AI and mean gradient 16 mmHg. There was also mild RVE with mildly reduced function and a small pericardial effusion.ECG with old RBBB and no high grade heart block. Continue plavix.ASA stopped due to thrombocytopenia.  ESRD on HD (M,W,F):per nephrology.We are hopeful he will tolerate HD better now that his severe AS is fixed. BP has improved. Because of Lifevest placement and his home HDcenter needingto be properly trained, plans are for discharge when approved by center   Acute on chronic combined S/D CHF: as evidenced by an extremely elevated BNP >3000 on preadmission lab work and need to admission due to worsening dyspnea. Volume management per nephrology. Patient is anuric. GMDT limited by hypotension with HD previously.   CAD: pre TAVR cath showed severe single vessel CAD with  chronic total occlusion of the RCA, collateralized with L--->R collaterals. Mild calcific nonobstructive LCA disease, anatomy unchanged from previous cath studies. No PCI targets. Continue medical therapy with atorvastatin and Plavix (No ASA due to worsening thrombocytopenia).  PAD: has an appointment with Dr. Donzetta Matters later this month to discuss R LE bypass and possible drainage of L groin seroma   Thrombocytopenia: this is chronic,but platelets down to 31,000 post operatively. Starting to recover and up to 35,000. No CBC from today. ASA stopped on 8/14 given worsening thrombocytopenia.  Polymorphic VT: Electrolytes are normal. QTc okay on ECG. He has not been on any BB given hypotension with HD. He has been started onamiodarone 400 mg BID andhe now has a LifeVest. Will taper amio at discharge. Training for lifevest will be provided to his outpatient hemodialysis center.  Ischemic cardiomyopathy: as above, had LifeVest placed.      Signed, Angelena Form, PA-C  10/28/2018, 8:35 AM    Patient seen, examined. Available data reviewed. Agree with findings,  assessment, and plan as outlined by Nell Range, PA-C. The patient is independently interviewed and examined. He looks great today. Walked with cardiac rehab and notices a big difference in his breathing and endurance. He is in NAD, lungs CTA, heart RRR with 2/6 SEM at the RUSB, extremity exam unchanged. Tele reviewed and shows a lower PVC burden and no further NSVT. He is ready for DC. He will have OP dialysis tomorrow. He is being DC'd with a LifeVest, likely duration 4-6 weeks until fully loaded with oral amiodarone. Will follow-up in clinic.   Sherren Mocha, M.D. 10/28/2018 10:43 AM

## 2018-10-28 NOTE — Progress Notes (Signed)
CARDIAC REHAB PHASE I   PRE:  Rate/Rhythm: 68 SR    BP: sitting 117/64    SaO2: 99 RA  MODE:  Ambulation: 270 ft   POST:  Rate/Rhythm: 77 SR with PVCs (but less)    BP: sitting 120/72     SaO2: 97 RA  Tolerated well with cane. Feels good. No c/o, ready to d/c.  [id number]   [place], ACSM 10/28/2018 10:21 AM

## 2018-10-29 ENCOUNTER — Ambulatory Visit: Payer: Medicare Other | Admitting: Physician Assistant

## 2018-10-29 DIAGNOSIS — N186 End stage renal disease: Secondary | ICD-10-CM | POA: Diagnosis not present

## 2018-10-29 DIAGNOSIS — D509 Iron deficiency anemia, unspecified: Secondary | ICD-10-CM | POA: Diagnosis not present

## 2018-10-29 DIAGNOSIS — Z992 Dependence on renal dialysis: Secondary | ICD-10-CM | POA: Diagnosis not present

## 2018-10-29 DIAGNOSIS — N2581 Secondary hyperparathyroidism of renal origin: Secondary | ICD-10-CM | POA: Diagnosis not present

## 2018-10-29 DIAGNOSIS — D696 Thrombocytopenia, unspecified: Secondary | ICD-10-CM | POA: Diagnosis not present

## 2018-10-29 DIAGNOSIS — D631 Anemia in chronic kidney disease: Secondary | ICD-10-CM | POA: Diagnosis not present

## 2018-10-31 DIAGNOSIS — Z992 Dependence on renal dialysis: Secondary | ICD-10-CM | POA: Diagnosis not present

## 2018-10-31 DIAGNOSIS — D696 Thrombocytopenia, unspecified: Secondary | ICD-10-CM | POA: Diagnosis not present

## 2018-10-31 DIAGNOSIS — N186 End stage renal disease: Secondary | ICD-10-CM | POA: Diagnosis not present

## 2018-10-31 DIAGNOSIS — N2581 Secondary hyperparathyroidism of renal origin: Secondary | ICD-10-CM | POA: Diagnosis not present

## 2018-10-31 DIAGNOSIS — D631 Anemia in chronic kidney disease: Secondary | ICD-10-CM | POA: Diagnosis not present

## 2018-10-31 DIAGNOSIS — D509 Iron deficiency anemia, unspecified: Secondary | ICD-10-CM | POA: Diagnosis not present

## 2018-11-03 DIAGNOSIS — D509 Iron deficiency anemia, unspecified: Secondary | ICD-10-CM | POA: Diagnosis not present

## 2018-11-03 DIAGNOSIS — N2581 Secondary hyperparathyroidism of renal origin: Secondary | ICD-10-CM | POA: Diagnosis not present

## 2018-11-03 DIAGNOSIS — Z992 Dependence on renal dialysis: Secondary | ICD-10-CM | POA: Diagnosis not present

## 2018-11-03 DIAGNOSIS — D631 Anemia in chronic kidney disease: Secondary | ICD-10-CM | POA: Diagnosis not present

## 2018-11-03 DIAGNOSIS — D696 Thrombocytopenia, unspecified: Secondary | ICD-10-CM | POA: Diagnosis not present

## 2018-11-03 DIAGNOSIS — N186 End stage renal disease: Secondary | ICD-10-CM | POA: Diagnosis not present

## 2018-11-05 DIAGNOSIS — D696 Thrombocytopenia, unspecified: Secondary | ICD-10-CM | POA: Diagnosis not present

## 2018-11-05 DIAGNOSIS — D631 Anemia in chronic kidney disease: Secondary | ICD-10-CM | POA: Diagnosis not present

## 2018-11-05 DIAGNOSIS — N2581 Secondary hyperparathyroidism of renal origin: Secondary | ICD-10-CM | POA: Diagnosis not present

## 2018-11-05 DIAGNOSIS — N186 End stage renal disease: Secondary | ICD-10-CM | POA: Diagnosis not present

## 2018-11-05 DIAGNOSIS — Z992 Dependence on renal dialysis: Secondary | ICD-10-CM | POA: Diagnosis not present

## 2018-11-05 DIAGNOSIS — D509 Iron deficiency anemia, unspecified: Secondary | ICD-10-CM | POA: Diagnosis not present

## 2018-11-07 ENCOUNTER — Ambulatory Visit: Payer: Medicare Other | Admitting: Vascular Surgery

## 2018-11-07 DIAGNOSIS — D696 Thrombocytopenia, unspecified: Secondary | ICD-10-CM | POA: Diagnosis not present

## 2018-11-07 DIAGNOSIS — N186 End stage renal disease: Secondary | ICD-10-CM | POA: Diagnosis not present

## 2018-11-07 DIAGNOSIS — Z992 Dependence on renal dialysis: Secondary | ICD-10-CM | POA: Diagnosis not present

## 2018-11-07 DIAGNOSIS — D509 Iron deficiency anemia, unspecified: Secondary | ICD-10-CM | POA: Diagnosis not present

## 2018-11-07 DIAGNOSIS — N2581 Secondary hyperparathyroidism of renal origin: Secondary | ICD-10-CM | POA: Diagnosis not present

## 2018-11-07 DIAGNOSIS — D631 Anemia in chronic kidney disease: Secondary | ICD-10-CM | POA: Diagnosis not present

## 2018-11-10 DIAGNOSIS — D696 Thrombocytopenia, unspecified: Secondary | ICD-10-CM | POA: Diagnosis not present

## 2018-11-10 DIAGNOSIS — D631 Anemia in chronic kidney disease: Secondary | ICD-10-CM | POA: Diagnosis not present

## 2018-11-10 DIAGNOSIS — Z992 Dependence on renal dialysis: Secondary | ICD-10-CM | POA: Diagnosis not present

## 2018-11-10 DIAGNOSIS — N186 End stage renal disease: Secondary | ICD-10-CM | POA: Diagnosis not present

## 2018-11-10 DIAGNOSIS — N2581 Secondary hyperparathyroidism of renal origin: Secondary | ICD-10-CM | POA: Diagnosis not present

## 2018-11-10 DIAGNOSIS — D509 Iron deficiency anemia, unspecified: Secondary | ICD-10-CM | POA: Diagnosis not present

## 2018-11-10 NOTE — Progress Notes (Signed)
[facility]                                       Cardiology Office Note    Date:  11/12/2018   ID:  Jose Garrett, DOB 1950/11/05, MRN [id number]  PCP:  [name], MD  Cardiologist:  Dr. Angelena Form Dr. Burt Knack &Dr. Roxy Manns (TAVR)  CC: 1 month s/p TAVR  History of Present Illness:  Jose Garrett is a 68 y.o. male with a history of ESRD on HD, AL amyloid (kidney involvement), HTN, HLD, GERD, anemia, thrombocytopenia, ischemic cardiomyopathy, chronic combined CHF, CAD,RBBB,carotid artery disease, PAD w/ left femoral to below the knee popliteal bypass with synthetic graft, s/p toe amputations, left groin seroma, and severe AS s/p TAVR (10/21/18) who presents to clinic for follow up.   Patient's cardiac history dates back to9/2019when hewas hospitalized with acute respiratory failure in the setting of hypertensive urgency. Troponin levels were elevated and EKG revealed diffuse ST segment depression. Cardiac catheterization revealed occlusion of the right coronary artery with nonobstructive disease in the left coronary territory. The right coronary artery could not be engaged. He was treated medically. Follow-up echocardiogram performed10/10/19revealed normal left ventricular systolic function with moderate concentric hypertrophy and findings worrisome for possible amyloid infiltration. The aoritcvalve was felt to be functionally bicuspid with moderate aortic stenosis.Patient remained stable until January of this year when he was hospitalized again with an exacerbation of congestive heart failure. Echocardiogram performed at that time revealed what was felt to be significant drop in left ventricular ejection fraction to 45%. Transvalvular gradient across the aortic valve was unchanged. The patient was seen in follow-up by Dr. Angelena Form in February of this year but plans for further evaluation to considerTAVRwere delayed because of  COVID-19.In May the patient was hospitalized with critical limb ischemia involving his left lower extremity with 2 gangrenous toes. He was found to have bilateral chronic occlusion of the superficial femoral arteries as well as the left popliteal artery. He underwent left common femoral to below-knee popliteal artery bypass graft using synthetic Gore-Tex graft. Postoperatively,he suffered an acute exacerbation of chronic combined systolic and diastolic congestive heart failure with mildly positive troponins consistent with NSTEMI. Catheterization revealed no significant change in his coronary artery disease with chronic occlusion of the right coronary artery and no significant disease in the left coronary circulation. Echocardiogram revealed further drop in left ventricular systolic function with ejection fraction estimated 35 to 40%. There was at least moderate and probably severe aortic stenosis. Peak velocity across aortic valve measured as high as 3.7 m/s corresponding to mean transvalvular gradient estimated 30 mmHg and aortic valve area calculated 0.87 cm. The DVI was notably only 0.20. His subsequent recovery was slow and he later underwent amputation of his gangrenous toes. He had a prolonged recovery requiring inpatient rehab. He developed a large seroma in the left groin associated with his surgical incision which has been treated conservatively. More recently he has been having problems tolerating dialysis with tendency to get hypotension towards the completion of dialysis treatments.  The patient has been evaluated by the multidisciplinary valve team and felt to have severe, symptomatic aortic stenosis and to be a suitable candidate for TAVR, which was set up for8/11/20.However, the patient developed worsening dyspnea and failure to thrive and presented to the hospital on 10/17/18 for evaluation and was admitted until TAVR.  He underwent successful TAVR with a 26 mm Edwards Sapien  Ultra THV via the R TF approach on 10/21/18. Post operative echo showed persistent moderate to severe LV systolic dysfunction (EF 48-54%) with normally functioning TAVR with no AI and mean gradient 16 mmHg. There was also mild RVE with mildly reduced function and a small pericardial effusion. He was started on aspirin and plavix, but aspirin later discontinued given worsening thrombocytopenia. Frequent runs of polymorphic VT was noted on tele. He was started on amiodarone and LifeVest was placed.   Today he presents to clinic for follow up. He is feeling much better since TAVR. No CP or SOB. No LE edema, orthopnea or PND. No dizziness or syncope. No blood in stool or urine. No palpitations. He is tolerating HD much better. Currently wearing lifevest.   Past Medical History:  Diagnosis Date  . Amyloidosis ([place])   . Anemia of chronic disease   . Anxiety   . CAD (coronary artery disease)    a. cardiac cath on 11/25/17 with occlusion of the RCA which could have been his event but the RCA could not be engaged. The RCA filled distally from left to right collaterals. There were no severe stenoses in the left coronary system. Medical therapy recommended, started on plavix.  . Chronic combined systolic and diastolic CHF (congestive heart failure) ([place])    a. EF dropped to 35-40% with grade 2 DD by echo 11/2017.  [name] DJD (degenerative joint disease), lumbar   . ESRD (end stage renal disease) ([place])    ESRD- HEMO MWF- [facility]  . GERD (gastroesophageal reflux disease)   . Hyperlipidemia   . Hypertension   . Neuropathy   . Peripheral vascular disease ([place])   . Restless legs   . S/P TAVR (transcatheter aortic valve replacement) 10/21/2018   26 mm Edwards Sapien 3 Ultra transcatheter heart valve placed via percutaneous right transfemoral approach   . Severe aortic stenosis    severe    Past Surgical History:  Procedure Laterality Date  . ABDOMINAL AORTOGRAM W/LOWER EXTREMITY Bilateral 07/16/2018    Procedure: ABDOMINAL AORTOGRAM W/LOWER EXTREMITY;  Surgeon: Wellington Hampshire, MD;  Location: [facility];  Service: Cardiovascular;  Laterality: Bilateral;  . AMPUTATION Left 08/19/2018   Procedure: AMPUTATION LEFT TOES SECOND AND THIRD;  Surgeon: Waynetta Sandy, MD;  Location: [facility];  Service: Vascular;  Laterality: Left;  . AV FISTULA PLACEMENT Right 12/27/2015   Procedure: Right Arm ARTERIOVENOUS (AV) FISTULA CREATION;  Surgeon: Angelia Mould, MD;  Location: [facility];  Service: Vascular;  Laterality: Right;  . CATARACT EXTRACTION W/ INTRAOCULAR LENS  IMPLANT, BILATERAL    . COLONOSCOPY    . ERCP W/ SPHICTEROTOMY    . FEMORAL-POPLITEAL BYPASS GRAFT Left 07/22/2018   Procedure: left FEMORAL TO BELOW KNEE POPLITEAL ARTERY BYPASS GREAFT using 51mm removable ring propaten gore graft;  Surgeon: Waynetta Sandy, MD;  Location: [facility];  Service: Vascular;  Laterality: Left;  . INTRAOPERATIVE TRANSTHORACIC ECHOCARDIOGRAM N/A 10/21/2018   Procedure: Intraoperative Transthoracic Echocardiogram;  Surgeon: Sherren Mocha, MD;  Location: [facility];  Service: Open Heart Surgery;  Laterality: N/A;  . IR GENERIC HISTORICAL  01/27/2016   IR FLUORO GUIDE CV LINE RIGHT 01/27/2016 Arne Cleveland, MD [facility]  . IR GENERIC HISTORICAL  01/27/2016   IR US GUIDE VASC ACCESS RIGHT 01/27/2016 Arne Cleveland, MD [facility]  . LAPAROSCOPIC CHOLECYSTECTOMY  03/2011  . LAPAROSCOPIC CHOLECYSTECTOMY    . LEFT HEART CATH AND  CORONARY ANGIOGRAPHY N/A 11/25/2017   Procedure: LEFT HEART CATH AND CORONARY ANGIOGRAPHY;  Surgeon: Martinique, Peter M, MD;  Location: [facility];  Service: Cardiovascular;  Laterality: N/A;  . LEFT HEART CATH AND CORONARY ANGIOGRAPHY N/A 07/24/2018   Procedure: LEFT HEART CATH AND CORONARY ANGIOGRAPHY;  Surgeon: Sherren Mocha, MD;  Location: [facility];  Service: Cardiovascular;  Laterality: N/A;  . RENAL BIOPSY, PERCUTANEOUS Right 09/08/2014  . RIGHT/LEFT  HEART CATH AND CORONARY ANGIOGRAPHY N/A 03/25/2018   Procedure: RIGHT/LEFT HEART CATH AND CORONARY ANGIOGRAPHY;  Surgeon: Martinique, Peter M, MD;  Location: [facility];  Service: Cardiovascular;  Laterality: N/A;  . TOE AMPUTATION Left 08/19/2018   2nd & 3rd toes  . TONSILLECTOMY  ~ 1960  . TRANSCATHETER AORTIC VALVE REPLACEMENT, TRANSFEMORAL N/A 10/21/2018   Procedure: TRANSCATHETER AORTIC VALVE REPLACEMENT, TRANSFEMORAL;  Surgeon: Sherren Mocha, MD;  Location: [facility];  Service: Open Heart Surgery;  Laterality: N/A;  . ULTRASOUND GUIDANCE FOR VASCULAR ACCESS  03/25/2018   Procedure: Ultrasound Guidance For Vascular Access;  Surgeon: Martinique, Peter M, MD;  Location: [facility];  Service: Cardiovascular;;    Current Medications: Outpatient Medications Prior to Visit  Medication Sig Dispense Refill  . acetaminophen (TYLENOL) 500 MG tablet Take 500-1,000 mg by mouth daily as needed for moderate pain.     [name] atorvastatin (LIPITOR) 80 MG tablet Take 1 tablet (80 mg total) by mouth daily with supper. 30 tablet 0  . cetirizine (ZYRTEC) 10 MG tablet Take 10 mg by mouth daily.    . clopidogrel (PLAVIX) 75 MG tablet Take 1 tablet (75 mg total) by mouth daily with supper. 30 tablet 0  . gabapentin (NEURONTIN) 300 MG capsule Take 1 capsule (300 mg total) by mouth every morning. (Patient taking differently: Take 300-600 mg by mouth See admin instructions. Take one capsule (300 mg) by mouth every morning and two capsules (600 mg) at night, may also take two capsules (600 mg) midday as needed for pain) 30 capsule 0  . LORazepam (ATIVAN) 1 MG tablet Take 1 mg by mouth 2 (two) times daily as needed for anxiety.    . midodrine (PROAMATINE) 10 MG tablet Take 1 tablet (10 mg total) by mouth every Monday, Wednesday, and Friday with hemodialysis. (Patient taking differently: Take 10 mg by mouth See admin instructions. Take one tablet (10 mg) by mouth every Monday, Wednesday, Friday before dialysis) 30 tablet  0  . multivitamin (RENA-VIT) TABS tablet Take 1 tablet by mouth at bedtime. 30 tablet 0  . mupirocin ointment (BACTROBAN) 2 % Apply 1 application topically daily as needed (irritation).     [name] oxyCODONE (OXY IR/ROXICODONE) 5 MG immediate release tablet Take 1-2 tablets (5-10 mg total) by mouth every 4 (four) hours as needed for moderate pain. 30 tablet 0  . pantoprazole (PROTONIX) 40 MG tablet Take 1 tablet (40 mg total) by mouth daily. (Patient taking differently: Take 40 mg by mouth every evening. ) 30 tablet 0  . sucroferric oxyhydroxide (VELPHORO) 500 MG chewable tablet Chew 2 tablets (1,000 mg total) by mouth 3 (three) times daily with meals. (Patient taking differently: Chew 500-1,000 mg by mouth See admin instructions. Take 2 tablets (1000 mg) by mouth up to three times daily with meals, take 1 tablet (500 mg) with snacks) 90 tablet 0  . vitamin B-12 (CYANOCOBALAMIN) 1000 MCG tablet Take 1,000 mcg by mouth every evening.     [name] amiodarone (PACERONE) 200 MG tablet Take 1 tablet (  200 mg total) by mouth 2 (two) times daily. 60 tablet 1  . lidocaine (LIDODERM) 5 % Place 1 patch onto the skin daily. Remove & Discard patch within 12 hours or as directed by MD (Patient not taking: Reported on 11/12/2018) 30 patch 0   No facility-administered medications prior to visit.      Allergies:   Patient has no known allergies.   Social History   Socioeconomic History  . Marital status: Divorced    Spouse name: Not on file  . Number of children: Not on file  . Years of education: Not on file  . Highest education level: Not on file  Occupational History  . Not on file  Social Needs  . Financial resource strain: Not on file  . Food insecurity    Worry: Not on file    Inability: Not on file  . Transportation needs    Medical: Not on file    Non-medical: Not on file  Tobacco Use  . Smoking status: Former Smoker    Packs/day: 2.00    Years: 40.00    Pack years: 80.00    Types: Cigarettes    Quit  date: 03/19/2011    Years since quitting: 7.6  . Smokeless tobacco: Never Used  Substance and Sexual Activity  . Alcohol use: No    Alcohol/week: 0.0 standard drinks  . Drug use: No    Comment:  "I have used marijuana till the 1990's". Notes that he quit 15 yrs ago  . Sexual activity: Not Currently  Lifestyle  . Physical activity    Days per week: Not on file    Minutes per session: Not on file  . Stress: Not on file  Relationships  . Social Herbalist on phone: Not on file    Gets together: Not on file    Attends religious service: Not on file    Active member of club or organization: Not on file    Attends meetings of clubs or organizations: Not on file    Relationship status: Not on file  Other Topics Concern  . 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      Family History:  The patient's family history includes Diabetes in his father, mother, and sister; Heart disease in his mother; Hypertension in his father and mother; Skin cancer in his mother.     ROS:   Please see the history of present illness.    ROS All other systems reviewed and are negative.   PHYSICAL EXAM:   VS:  BP (!) 145/64   Pulse 69   Ht 5\' 10"  (1.778 m)   Wt 176 lb 1.9 oz (79.9 kg)   BMI 25.27 kg/m    GEN: Well nourished, well developed, in no acute distress HEENT: normal Neck: no JVD or masses Cardiac: RRR; soft flow murmur. No rubs, or gallops,no edema  Respiratory:  clear to auscultation bilaterally, normal work of breathing GI: soft, nontender, nondistended, + BS MS: no deformity or atrophy Skin: warm and dry, no rash. Left seroma that is stable.  Neuro:  Alert and Oriented x 3, Strength and sensation are intact Psych: euthymic mood, full affect   Wt Readings from Last 3 Encounters:  11/12/18 176 lb 1.9 oz (79.9 kg)  10/27/18 182 lb 1.6  oz (82.6 kg)  10/16/18 186 lb (84.4 kg)      Studies/Labs Reviewed:   EKG:  EKG is ordered today.  The ekg ordered today demonstrates NSR, RBBB, HR 70  Recent Labs: 10/16/2018: ALT 13 10/17/2018: B Natriuretic Peptide 1,967.8 10/18/2018: TSH 1.662 10/23/2018: Magnesium 2.2 10/27/2018: BUN 54; Creatinine, Ser 9.21; Hemoglobin 9.7; Platelets 35; Potassium 4.6; Sodium 137   Lipid Panel    Component Value Date/Time   CHOL 117 02/12/2018 0823   TRIG 174 (H) 02/12/2018 0823   HDL 32 (L) 02/12/2018 0823   CHOLHDL 3.7 02/12/2018 0823   CHOLHDL 7.4 02/25/2014 0959   VLDL 40 02/25/2014 0959   LDLCALC 50 02/12/2018 0823    Additional studies/ records that were reviewed today include:  TAVR OPERATIVE NOTE   Date of Procedure:10/21/2018  Preoperative Diagnosis:Severe Aortic Stenosis   Postoperative Diagnosis:Same   Procedure:   Transcatheter Aortic Valve Replacement - PercutaneousRightTransfemoral Approach Edwards Sapien 3 UltraTHV (size10mm, model # [id number], serial [id number])  Co-Surgeons:Clarence H. Roxy Manns, MD and Sherren Mocha, MD  Anesthesiologist:John Lissa Hoard, MD  Echocardiographer:Peter Johnsie Cancel, MD  Pre-operative Echo Findings: ? Severe aortic stenosis ? Moderate-severeleft ventricular systolic dysfunction  Post-operative Echo Findings: ? Noparavalvular leak ? Unchangedleft ventricular systolic function  __________________   Echo 10/22/18: IMPRESSIONS 1. The left ventricle has moderate-severely reduced systolic function, with an ejection fraction of 30-35%. The cavity size was normal. There is mildly increased left ventricular wall thickness. Left ventricular diastolic Doppler parameters are indeterminate. Left ventricular diffuse hypokinesis. 2. The right ventricle has mildly reduced systolic function. The cavity was mildly enlarged.  3. Large pleural effusion in the left lateral region. 4. Small pericardial effusion. 5. The mitral valve is abnormal. Mild thickening of the mitral valve leaflet. There is mild mitral annular calcification present. 6. The tricuspid valve is grossly normal. 7. The aorta is normal in size and structure. 8. The inferior vena cava was dilated in size with >50% respiratory variability. 9. Moderate to severe global reduction in LV systolic function; mild LVH; s/p TAVR with no AI and mean gradient 16 mmHg; mild RVE with mildly reduced function; small pericardial effusion.  ____________  Echo 11/11/18 IMPRESSIONS  1. The left ventricle has moderately reduced systolic function, with an ejection fraction of 35-40%. The cavity size was normal. There is mildly increased left ventricular wall thickness. Left ventricular diastolic Doppler parameters are consistent with  pseudonormalization.  2. The right ventricle has normal systolic function. The cavity was normal. There is no increase in right ventricular wall thickness.  3. Left atrial size was mildly dilated.  4. Mitral valve regurgitation is mild to moderate by color flow Doppler.  5. A Edwards Sapien bioprosthetic aortic valve (TAVR) valve is present in the aortic position.  6. No stenosis of the aortic valve.  7. The aorta is normal unless otherwise noted.  8. The aortic root and ascending aorta are normal in size and structure.  9. The atrial septum is grossly normal. 10. The average left ventricular global longitudinal strain is -7.5 %.  Mean gradient 13.8 mm Hg and no PVL  ASSESSMENT & PLAN:   Severe AS s/p TAVR:1 month echo showed EF 35-40%, normally functioning TAVR with mean gradient of 13.8 mm Hg and no PVL.  He has NYHA class II symptoms. SBE prophylaxis discussed; he is edentulous and does not go to dentist..He is currently on monotherapy with Plavix given thrombocytopenia. Continue this for now. I will see him back in 1 year  with an echo.  ESRD on HD (M,W,F):followed by Dr Jimmy Footman. He is tolerating hemodialysis much better since TAVR.   Chronic combined S/D CHF: appears euvolemic. EF 35-40% by echo 11/11/18. He has been on GDMT in the past due to hypotension and renal disease. BP is elevated today and he is no longer getting hypotensive with HD since his TAVR. Will plan to start Coreg 3.125mg  BID now.   CAD: pre TAVR cath showed severe single vessel CAD with chronic total occlusion of the RCA, collateralized with L--->R collaterals. Mild calcific nonobstructive LCA disease, anatomy unchanged from previous cath studies. No PCI targets. Continue medical therapywith atorvastatin and Plavix (No ASA due thrombocytopenia).  PAD: has an appointment with Dr. Donzetta Matters later this month to discuss R LE bypass and possible drainage of L groin seroma.  Thrombocytopenia: platelets down to 35K at discharge. CBC today   Polymorphic VT: ECG today shows no ectopy. Continue LifeVest. He can discontinue this after 6 weeks on 9/29. Drop amio to 200 mg daily and start Coreg 3.125mg  BID.  Medication Adjustments/Labs and Tests Ordered: Current medicines are reviewed at length with the patient today.  Concerns regarding medicines are outlined above.  Medication changes, Labs and Tests ordered today are listed in the Patient Instructions below. Patient Instructions  Medication Instructions:  1) DECREASE AMIODARONE to 200 mg once daily 2) START CARVEDILOL (Coreg) 3.125 mg twice daily  Labwork: TODAY: CBC  Follow-Up: You have a follow-up appointment with Dr. Angelena Form on 02/26/2019 at 1:40PM.  Other information: You may discontinue your LifeVest and sent it back to the company on December 09, 2018.    Signed, Angelena Form, PA-C  11/12/2018 4:48 PM    [facility] [address], [place], [place]  [address] Phone: [phone]; Fax: [phone]

## 2018-11-11 ENCOUNTER — Other Ambulatory Visit: Payer: Self-pay

## 2018-11-11 ENCOUNTER — Ambulatory Visit (HOSPITAL_COMMUNITY): Payer: Medicare Other | Attending: Cardiovascular Disease

## 2018-11-11 DIAGNOSIS — I2510 Atherosclerotic heart disease of native coronary artery without angina pectoris: Secondary | ICD-10-CM | POA: Diagnosis not present

## 2018-11-11 DIAGNOSIS — I779 Disorder of arteries and arterioles, unspecified: Secondary | ICD-10-CM | POA: Diagnosis not present

## 2018-11-11 DIAGNOSIS — N2581 Secondary hyperparathyroidism of renal origin: Secondary | ICD-10-CM | POA: Diagnosis not present

## 2018-11-11 DIAGNOSIS — Z23 Encounter for immunization: Secondary | ICD-10-CM | POA: Diagnosis not present

## 2018-11-11 DIAGNOSIS — Z952 Presence of prosthetic heart valve: Secondary | ICD-10-CM

## 2018-11-11 DIAGNOSIS — D696 Thrombocytopenia, unspecified: Secondary | ICD-10-CM | POA: Diagnosis not present

## 2018-11-11 DIAGNOSIS — I5043 Acute on chronic combined systolic (congestive) and diastolic (congestive) heart failure: Secondary | ICD-10-CM | POA: Diagnosis not present

## 2018-11-11 DIAGNOSIS — Z992 Dependence on renal dialysis: Secondary | ICD-10-CM | POA: Diagnosis not present

## 2018-11-11 DIAGNOSIS — D509 Iron deficiency anemia, unspecified: Secondary | ICD-10-CM | POA: Diagnosis not present

## 2018-11-11 DIAGNOSIS — I472 Ventricular tachycardia: Secondary | ICD-10-CM | POA: Diagnosis not present

## 2018-11-11 DIAGNOSIS — I739 Peripheral vascular disease, unspecified: Secondary | ICD-10-CM | POA: Diagnosis not present

## 2018-11-11 DIAGNOSIS — I129 Hypertensive chronic kidney disease with stage 1 through stage 4 chronic kidney disease, or unspecified chronic kidney disease: Secondary | ICD-10-CM | POA: Diagnosis not present

## 2018-11-11 DIAGNOSIS — N186 End stage renal disease: Secondary | ICD-10-CM | POA: Diagnosis not present

## 2018-11-12 ENCOUNTER — Other Ambulatory Visit (HOSPITAL_COMMUNITY): Payer: Medicare Other

## 2018-11-12 ENCOUNTER — Ambulatory Visit (INDEPENDENT_AMBULATORY_CARE_PROVIDER_SITE_OTHER): Payer: Medicare Other | Admitting: Physician Assistant

## 2018-11-12 ENCOUNTER — Encounter: Payer: Self-pay | Admitting: Physician Assistant

## 2018-11-12 VITALS — BP 145/64 | HR 69 | Ht 70.0 in | Wt 176.1 lb

## 2018-11-12 DIAGNOSIS — I739 Peripheral vascular disease, unspecified: Secondary | ICD-10-CM

## 2018-11-12 DIAGNOSIS — D509 Iron deficiency anemia, unspecified: Secondary | ICD-10-CM | POA: Diagnosis not present

## 2018-11-12 DIAGNOSIS — Z992 Dependence on renal dialysis: Secondary | ICD-10-CM

## 2018-11-12 DIAGNOSIS — Z952 Presence of prosthetic heart valve: Secondary | ICD-10-CM

## 2018-11-12 DIAGNOSIS — N2581 Secondary hyperparathyroidism of renal origin: Secondary | ICD-10-CM | POA: Diagnosis not present

## 2018-11-12 DIAGNOSIS — I472 Ventricular tachycardia: Secondary | ICD-10-CM | POA: Diagnosis not present

## 2018-11-12 DIAGNOSIS — I2510 Atherosclerotic heart disease of native coronary artery without angina pectoris: Secondary | ICD-10-CM | POA: Diagnosis not present

## 2018-11-12 DIAGNOSIS — I779 Disorder of arteries and arterioles, unspecified: Secondary | ICD-10-CM

## 2018-11-12 DIAGNOSIS — N186 End stage renal disease: Secondary | ICD-10-CM

## 2018-11-12 DIAGNOSIS — I4729 Other ventricular tachycardia: Secondary | ICD-10-CM

## 2018-11-12 DIAGNOSIS — I5043 Acute on chronic combined systolic (congestive) and diastolic (congestive) heart failure: Secondary | ICD-10-CM | POA: Diagnosis not present

## 2018-11-12 DIAGNOSIS — D631 Anemia in chronic kidney disease: Secondary | ICD-10-CM | POA: Diagnosis not present

## 2018-11-12 DIAGNOSIS — D696 Thrombocytopenia, unspecified: Secondary | ICD-10-CM

## 2018-11-12 DIAGNOSIS — Z23 Encounter for immunization: Secondary | ICD-10-CM | POA: Diagnosis not present

## 2018-11-12 MED ORDER — AMIODARONE HCL 200 MG PO TABS: 200.0000 mg | ORAL_TABLET | Freq: Every day | ORAL | 3 refills | Status: DC

## 2018-11-12 MED ORDER — CARVEDILOL 3.125 MG PO TABS: 3.1250 mg | ORAL_TABLET | Freq: Two times a day (BID) | ORAL | 3 refills | Status: DC

## 2018-11-12 NOTE — Patient Instructions (Addendum)
Medication Instructions:  1) DECREASE AMIODARONE to 200 mg once daily 2) START CARVEDILOL (Coreg) 3.125 mg twice daily  Labwork: TODAY: CBC  Follow-Up: You have a follow-up appointment with Dr. Angelena Form on 02/26/2019 at 1:40PM.  Other information: You may discontinue your LifeVest and sent it back to the company on December 09, 2018.

## 2018-11-13 ENCOUNTER — Other Ambulatory Visit (HOSPITAL_COMMUNITY): Payer: Medicare Other

## 2018-11-13 LAB — CBC WITH DIFFERENTIAL/PLATELET
Basophils Absolute: 0 10*3/uL (ref 0.0–0.2)
Basos: 1 %
EOS (ABSOLUTE): 0.2 10*3/uL (ref 0.0–0.4)
Eos: 4 %
Hematocrit: 35 % — ABNORMAL LOW (ref 37.5–51.0)
Hemoglobin: 11.4 g/dL — ABNORMAL LOW (ref 13.0–17.7)
Immature Grans (Abs): 0 10*3/uL (ref 0.0–0.1)
Immature Granulocytes: 1 %
Lymphocytes Absolute: 1 10*3/uL (ref 0.7–3.1)
Lymphs: 22 %
MCH: 33.5 pg — ABNORMAL HIGH (ref 26.6–33.0)
MCHC: 32.6 g/dL (ref 31.5–35.7)
MCV: 103 fL — ABNORMAL HIGH (ref 79–97)
Monocytes Absolute: 0.5 10*3/uL (ref 0.1–0.9)
Monocytes: 11 %
Neutrophils Absolute: 2.7 10*3/uL (ref 1.4–7.0)
Neutrophils: 61 %
Platelets: 51 10*3/uL — CL (ref 150–450)
RBC: 3.4 x10E6/uL — ABNORMAL LOW (ref 4.14–5.80)
RDW: 14.6 % (ref 11.6–15.4)
WBC: 4.4 10*3/uL (ref 3.4–10.8)

## 2018-11-14 DIAGNOSIS — N2581 Secondary hyperparathyroidism of renal origin: Secondary | ICD-10-CM | POA: Diagnosis not present

## 2018-11-14 DIAGNOSIS — D696 Thrombocytopenia, unspecified: Secondary | ICD-10-CM | POA: Diagnosis not present

## 2018-11-14 DIAGNOSIS — D509 Iron deficiency anemia, unspecified: Secondary | ICD-10-CM | POA: Diagnosis not present

## 2018-11-14 DIAGNOSIS — N186 End stage renal disease: Secondary | ICD-10-CM | POA: Diagnosis not present

## 2018-11-14 DIAGNOSIS — Z992 Dependence on renal dialysis: Secondary | ICD-10-CM | POA: Diagnosis not present

## 2018-11-14 DIAGNOSIS — Z23 Encounter for immunization: Secondary | ICD-10-CM | POA: Diagnosis not present

## 2018-11-17 DIAGNOSIS — N2581 Secondary hyperparathyroidism of renal origin: Secondary | ICD-10-CM | POA: Diagnosis not present

## 2018-11-17 DIAGNOSIS — N186 End stage renal disease: Secondary | ICD-10-CM | POA: Diagnosis not present

## 2018-11-17 DIAGNOSIS — D696 Thrombocytopenia, unspecified: Secondary | ICD-10-CM | POA: Diagnosis not present

## 2018-11-17 DIAGNOSIS — Z23 Encounter for immunization: Secondary | ICD-10-CM | POA: Diagnosis not present

## 2018-11-17 DIAGNOSIS — Z992 Dependence on renal dialysis: Secondary | ICD-10-CM | POA: Diagnosis not present

## 2018-11-17 DIAGNOSIS — D509 Iron deficiency anemia, unspecified: Secondary | ICD-10-CM | POA: Diagnosis not present

## 2018-11-19 ENCOUNTER — Other Ambulatory Visit (HOSPITAL_COMMUNITY): Payer: Medicare Other

## 2018-11-19 DIAGNOSIS — Z992 Dependence on renal dialysis: Secondary | ICD-10-CM | POA: Diagnosis not present

## 2018-11-19 DIAGNOSIS — Z23 Encounter for immunization: Secondary | ICD-10-CM | POA: Diagnosis not present

## 2018-11-19 DIAGNOSIS — D509 Iron deficiency anemia, unspecified: Secondary | ICD-10-CM | POA: Diagnosis not present

## 2018-11-19 DIAGNOSIS — N186 End stage renal disease: Secondary | ICD-10-CM | POA: Diagnosis not present

## 2018-11-19 DIAGNOSIS — D696 Thrombocytopenia, unspecified: Secondary | ICD-10-CM | POA: Diagnosis not present

## 2018-11-19 DIAGNOSIS — N2581 Secondary hyperparathyroidism of renal origin: Secondary | ICD-10-CM | POA: Diagnosis not present

## 2018-11-20 ENCOUNTER — Telehealth: Payer: Medicare Other | Admitting: Physician Assistant

## 2018-11-21 DIAGNOSIS — D696 Thrombocytopenia, unspecified: Secondary | ICD-10-CM | POA: Diagnosis not present

## 2018-11-21 DIAGNOSIS — Z23 Encounter for immunization: Secondary | ICD-10-CM | POA: Diagnosis not present

## 2018-11-21 DIAGNOSIS — N186 End stage renal disease: Secondary | ICD-10-CM | POA: Diagnosis not present

## 2018-11-21 DIAGNOSIS — D509 Iron deficiency anemia, unspecified: Secondary | ICD-10-CM | POA: Diagnosis not present

## 2018-11-21 DIAGNOSIS — N2581 Secondary hyperparathyroidism of renal origin: Secondary | ICD-10-CM | POA: Diagnosis not present

## 2018-11-21 DIAGNOSIS — Z992 Dependence on renal dialysis: Secondary | ICD-10-CM | POA: Diagnosis not present

## 2018-11-24 DIAGNOSIS — Z992 Dependence on renal dialysis: Secondary | ICD-10-CM | POA: Diagnosis not present

## 2018-11-24 DIAGNOSIS — D696 Thrombocytopenia, unspecified: Secondary | ICD-10-CM | POA: Diagnosis not present

## 2018-11-24 DIAGNOSIS — D509 Iron deficiency anemia, unspecified: Secondary | ICD-10-CM | POA: Diagnosis not present

## 2018-11-24 DIAGNOSIS — Z23 Encounter for immunization: Secondary | ICD-10-CM | POA: Diagnosis not present

## 2018-11-24 DIAGNOSIS — N186 End stage renal disease: Secondary | ICD-10-CM | POA: Diagnosis not present

## 2018-11-24 DIAGNOSIS — N2581 Secondary hyperparathyroidism of renal origin: Secondary | ICD-10-CM | POA: Diagnosis not present

## 2018-11-26 DIAGNOSIS — Z992 Dependence on renal dialysis: Secondary | ICD-10-CM | POA: Diagnosis not present

## 2018-11-26 DIAGNOSIS — D509 Iron deficiency anemia, unspecified: Secondary | ICD-10-CM | POA: Diagnosis not present

## 2018-11-26 DIAGNOSIS — N2581 Secondary hyperparathyroidism of renal origin: Secondary | ICD-10-CM | POA: Diagnosis not present

## 2018-11-26 DIAGNOSIS — D696 Thrombocytopenia, unspecified: Secondary | ICD-10-CM | POA: Diagnosis not present

## 2018-11-26 DIAGNOSIS — N186 End stage renal disease: Secondary | ICD-10-CM | POA: Diagnosis not present

## 2018-11-26 DIAGNOSIS — Z23 Encounter for immunization: Secondary | ICD-10-CM | POA: Diagnosis not present

## 2018-11-28 DIAGNOSIS — Z992 Dependence on renal dialysis: Secondary | ICD-10-CM | POA: Diagnosis not present

## 2018-11-28 DIAGNOSIS — Z23 Encounter for immunization: Secondary | ICD-10-CM | POA: Diagnosis not present

## 2018-11-28 DIAGNOSIS — N2581 Secondary hyperparathyroidism of renal origin: Secondary | ICD-10-CM | POA: Diagnosis not present

## 2018-11-28 DIAGNOSIS — D696 Thrombocytopenia, unspecified: Secondary | ICD-10-CM | POA: Diagnosis not present

## 2018-11-28 DIAGNOSIS — D509 Iron deficiency anemia, unspecified: Secondary | ICD-10-CM | POA: Diagnosis not present

## 2018-11-28 DIAGNOSIS — N186 End stage renal disease: Secondary | ICD-10-CM | POA: Diagnosis not present

## 2018-12-01 DIAGNOSIS — D509 Iron deficiency anemia, unspecified: Secondary | ICD-10-CM | POA: Diagnosis not present

## 2018-12-01 DIAGNOSIS — Z992 Dependence on renal dialysis: Secondary | ICD-10-CM | POA: Diagnosis not present

## 2018-12-01 DIAGNOSIS — D696 Thrombocytopenia, unspecified: Secondary | ICD-10-CM | POA: Diagnosis not present

## 2018-12-01 DIAGNOSIS — N2581 Secondary hyperparathyroidism of renal origin: Secondary | ICD-10-CM | POA: Diagnosis not present

## 2018-12-01 DIAGNOSIS — Z23 Encounter for immunization: Secondary | ICD-10-CM | POA: Diagnosis not present

## 2018-12-01 DIAGNOSIS — N186 End stage renal disease: Secondary | ICD-10-CM | POA: Diagnosis not present

## 2018-12-03 DIAGNOSIS — D509 Iron deficiency anemia, unspecified: Secondary | ICD-10-CM | POA: Diagnosis not present

## 2018-12-03 DIAGNOSIS — D696 Thrombocytopenia, unspecified: Secondary | ICD-10-CM | POA: Diagnosis not present

## 2018-12-03 DIAGNOSIS — N2581 Secondary hyperparathyroidism of renal origin: Secondary | ICD-10-CM | POA: Diagnosis not present

## 2018-12-03 DIAGNOSIS — N186 End stage renal disease: Secondary | ICD-10-CM | POA: Diagnosis not present

## 2018-12-03 DIAGNOSIS — Z23 Encounter for immunization: Secondary | ICD-10-CM | POA: Diagnosis not present

## 2018-12-03 DIAGNOSIS — Z992 Dependence on renal dialysis: Secondary | ICD-10-CM | POA: Diagnosis not present

## 2018-12-05 DIAGNOSIS — Z992 Dependence on renal dialysis: Secondary | ICD-10-CM | POA: Diagnosis not present

## 2018-12-05 DIAGNOSIS — Z23 Encounter for immunization: Secondary | ICD-10-CM | POA: Diagnosis not present

## 2018-12-05 DIAGNOSIS — D509 Iron deficiency anemia, unspecified: Secondary | ICD-10-CM | POA: Diagnosis not present

## 2018-12-05 DIAGNOSIS — D696 Thrombocytopenia, unspecified: Secondary | ICD-10-CM | POA: Diagnosis not present

## 2018-12-05 DIAGNOSIS — N186 End stage renal disease: Secondary | ICD-10-CM | POA: Diagnosis not present

## 2018-12-05 DIAGNOSIS — N2581 Secondary hyperparathyroidism of renal origin: Secondary | ICD-10-CM | POA: Diagnosis not present

## 2018-12-08 DIAGNOSIS — Z992 Dependence on renal dialysis: Secondary | ICD-10-CM | POA: Diagnosis not present

## 2018-12-08 DIAGNOSIS — Z23 Encounter for immunization: Secondary | ICD-10-CM | POA: Diagnosis not present

## 2018-12-08 DIAGNOSIS — N186 End stage renal disease: Secondary | ICD-10-CM | POA: Diagnosis not present

## 2018-12-08 DIAGNOSIS — D509 Iron deficiency anemia, unspecified: Secondary | ICD-10-CM | POA: Diagnosis not present

## 2018-12-08 DIAGNOSIS — D696 Thrombocytopenia, unspecified: Secondary | ICD-10-CM | POA: Diagnosis not present

## 2018-12-08 DIAGNOSIS — N2581 Secondary hyperparathyroidism of renal origin: Secondary | ICD-10-CM | POA: Diagnosis not present

## 2018-12-10 DIAGNOSIS — D696 Thrombocytopenia, unspecified: Secondary | ICD-10-CM | POA: Diagnosis not present

## 2018-12-10 DIAGNOSIS — Z23 Encounter for immunization: Secondary | ICD-10-CM | POA: Diagnosis not present

## 2018-12-10 DIAGNOSIS — D509 Iron deficiency anemia, unspecified: Secondary | ICD-10-CM | POA: Diagnosis not present

## 2018-12-10 DIAGNOSIS — N186 End stage renal disease: Secondary | ICD-10-CM | POA: Diagnosis not present

## 2018-12-10 DIAGNOSIS — N2581 Secondary hyperparathyroidism of renal origin: Secondary | ICD-10-CM | POA: Diagnosis not present

## 2018-12-10 DIAGNOSIS — Z992 Dependence on renal dialysis: Secondary | ICD-10-CM | POA: Diagnosis not present

## 2018-12-11 DIAGNOSIS — N186 End stage renal disease: Secondary | ICD-10-CM | POA: Diagnosis not present

## 2018-12-11 DIAGNOSIS — I129 Hypertensive chronic kidney disease with stage 1 through stage 4 chronic kidney disease, or unspecified chronic kidney disease: Secondary | ICD-10-CM | POA: Diagnosis not present

## 2018-12-11 DIAGNOSIS — Z992 Dependence on renal dialysis: Secondary | ICD-10-CM | POA: Diagnosis not present

## 2018-12-12 DIAGNOSIS — Z992 Dependence on renal dialysis: Secondary | ICD-10-CM | POA: Diagnosis not present

## 2018-12-12 DIAGNOSIS — D696 Thrombocytopenia, unspecified: Secondary | ICD-10-CM | POA: Diagnosis not present

## 2018-12-12 DIAGNOSIS — N186 End stage renal disease: Secondary | ICD-10-CM | POA: Diagnosis not present

## 2018-12-12 DIAGNOSIS — N2581 Secondary hyperparathyroidism of renal origin: Secondary | ICD-10-CM | POA: Diagnosis not present

## 2018-12-12 DIAGNOSIS — D509 Iron deficiency anemia, unspecified: Secondary | ICD-10-CM | POA: Diagnosis not present

## 2018-12-15 DIAGNOSIS — D509 Iron deficiency anemia, unspecified: Secondary | ICD-10-CM | POA: Diagnosis not present

## 2018-12-15 DIAGNOSIS — D696 Thrombocytopenia, unspecified: Secondary | ICD-10-CM | POA: Diagnosis not present

## 2018-12-15 DIAGNOSIS — N2581 Secondary hyperparathyroidism of renal origin: Secondary | ICD-10-CM | POA: Diagnosis not present

## 2018-12-15 DIAGNOSIS — N186 End stage renal disease: Secondary | ICD-10-CM | POA: Diagnosis not present

## 2018-12-15 DIAGNOSIS — Z992 Dependence on renal dialysis: Secondary | ICD-10-CM | POA: Diagnosis not present

## 2018-12-16 ENCOUNTER — Encounter: Payer: Medicare Other | Attending: Physical Medicine & Rehabilitation | Admitting: Physical Medicine & Rehabilitation

## 2018-12-16 ENCOUNTER — Ambulatory Visit: Payer: Medicare Other | Admitting: Physical Medicine & Rehabilitation

## 2018-12-16 DIAGNOSIS — Z992 Dependence on renal dialysis: Secondary | ICD-10-CM | POA: Insufficient documentation

## 2018-12-16 DIAGNOSIS — R5381 Other malaise: Secondary | ICD-10-CM | POA: Insufficient documentation

## 2018-12-16 DIAGNOSIS — N186 End stage renal disease: Secondary | ICD-10-CM | POA: Insufficient documentation

## 2018-12-16 DIAGNOSIS — G629 Polyneuropathy, unspecified: Secondary | ICD-10-CM | POA: Insufficient documentation

## 2018-12-16 DIAGNOSIS — I350 Nonrheumatic aortic (valve) stenosis: Secondary | ICD-10-CM | POA: Insufficient documentation

## 2018-12-16 DIAGNOSIS — I5032 Chronic diastolic (congestive) heart failure: Secondary | ICD-10-CM | POA: Insufficient documentation

## 2018-12-16 DIAGNOSIS — I739 Peripheral vascular disease, unspecified: Secondary | ICD-10-CM | POA: Insufficient documentation

## 2018-12-17 DIAGNOSIS — Z992 Dependence on renal dialysis: Secondary | ICD-10-CM | POA: Diagnosis not present

## 2018-12-17 DIAGNOSIS — D696 Thrombocytopenia, unspecified: Secondary | ICD-10-CM | POA: Diagnosis not present

## 2018-12-17 DIAGNOSIS — N186 End stage renal disease: Secondary | ICD-10-CM | POA: Diagnosis not present

## 2018-12-17 DIAGNOSIS — N2581 Secondary hyperparathyroidism of renal origin: Secondary | ICD-10-CM | POA: Diagnosis not present

## 2018-12-17 DIAGNOSIS — D509 Iron deficiency anemia, unspecified: Secondary | ICD-10-CM | POA: Diagnosis not present

## 2018-12-19 ENCOUNTER — Ambulatory Visit: Payer: Medicare Other | Admitting: Vascular Surgery

## 2018-12-19 DIAGNOSIS — Z992 Dependence on renal dialysis: Secondary | ICD-10-CM | POA: Diagnosis not present

## 2018-12-19 DIAGNOSIS — N2581 Secondary hyperparathyroidism of renal origin: Secondary | ICD-10-CM | POA: Diagnosis not present

## 2018-12-19 DIAGNOSIS — N186 End stage renal disease: Secondary | ICD-10-CM | POA: Diagnosis not present

## 2018-12-19 DIAGNOSIS — D509 Iron deficiency anemia, unspecified: Secondary | ICD-10-CM | POA: Diagnosis not present

## 2018-12-19 DIAGNOSIS — D696 Thrombocytopenia, unspecified: Secondary | ICD-10-CM | POA: Diagnosis not present

## 2018-12-22 DIAGNOSIS — N186 End stage renal disease: Secondary | ICD-10-CM | POA: Diagnosis not present

## 2018-12-22 DIAGNOSIS — N2581 Secondary hyperparathyroidism of renal origin: Secondary | ICD-10-CM | POA: Diagnosis not present

## 2018-12-22 DIAGNOSIS — Z992 Dependence on renal dialysis: Secondary | ICD-10-CM | POA: Diagnosis not present

## 2018-12-22 DIAGNOSIS — D509 Iron deficiency anemia, unspecified: Secondary | ICD-10-CM | POA: Diagnosis not present

## 2018-12-22 DIAGNOSIS — D696 Thrombocytopenia, unspecified: Secondary | ICD-10-CM | POA: Diagnosis not present

## 2018-12-24 DIAGNOSIS — N186 End stage renal disease: Secondary | ICD-10-CM | POA: Diagnosis not present

## 2018-12-24 DIAGNOSIS — Z992 Dependence on renal dialysis: Secondary | ICD-10-CM | POA: Diagnosis not present

## 2018-12-24 DIAGNOSIS — D696 Thrombocytopenia, unspecified: Secondary | ICD-10-CM | POA: Diagnosis not present

## 2018-12-24 DIAGNOSIS — N2581 Secondary hyperparathyroidism of renal origin: Secondary | ICD-10-CM | POA: Diagnosis not present

## 2018-12-24 DIAGNOSIS — D509 Iron deficiency anemia, unspecified: Secondary | ICD-10-CM | POA: Diagnosis not present

## 2018-12-26 DIAGNOSIS — D509 Iron deficiency anemia, unspecified: Secondary | ICD-10-CM | POA: Diagnosis not present

## 2018-12-26 DIAGNOSIS — D696 Thrombocytopenia, unspecified: Secondary | ICD-10-CM | POA: Diagnosis not present

## 2018-12-26 DIAGNOSIS — N2581 Secondary hyperparathyroidism of renal origin: Secondary | ICD-10-CM | POA: Diagnosis not present

## 2018-12-26 DIAGNOSIS — Z992 Dependence on renal dialysis: Secondary | ICD-10-CM | POA: Diagnosis not present

## 2018-12-26 DIAGNOSIS — N186 End stage renal disease: Secondary | ICD-10-CM | POA: Diagnosis not present

## 2018-12-29 DIAGNOSIS — Z992 Dependence on renal dialysis: Secondary | ICD-10-CM | POA: Diagnosis not present

## 2018-12-29 DIAGNOSIS — T7840XA Allergy, unspecified, initial encounter: Secondary | ICD-10-CM | POA: Insufficient documentation

## 2018-12-29 DIAGNOSIS — D509 Iron deficiency anemia, unspecified: Secondary | ICD-10-CM | POA: Diagnosis not present

## 2018-12-29 DIAGNOSIS — N186 End stage renal disease: Secondary | ICD-10-CM | POA: Diagnosis not present

## 2018-12-29 DIAGNOSIS — N2581 Secondary hyperparathyroidism of renal origin: Secondary | ICD-10-CM | POA: Diagnosis not present

## 2018-12-29 DIAGNOSIS — D696 Thrombocytopenia, unspecified: Secondary | ICD-10-CM | POA: Diagnosis not present

## 2018-12-31 DIAGNOSIS — D696 Thrombocytopenia, unspecified: Secondary | ICD-10-CM | POA: Diagnosis not present

## 2018-12-31 DIAGNOSIS — Z992 Dependence on renal dialysis: Secondary | ICD-10-CM | POA: Diagnosis not present

## 2018-12-31 DIAGNOSIS — N186 End stage renal disease: Secondary | ICD-10-CM | POA: Diagnosis not present

## 2018-12-31 DIAGNOSIS — D509 Iron deficiency anemia, unspecified: Secondary | ICD-10-CM | POA: Diagnosis not present

## 2018-12-31 DIAGNOSIS — N2581 Secondary hyperparathyroidism of renal origin: Secondary | ICD-10-CM | POA: Diagnosis not present

## 2019-01-02 DIAGNOSIS — D509 Iron deficiency anemia, unspecified: Secondary | ICD-10-CM | POA: Diagnosis not present

## 2019-01-02 DIAGNOSIS — Z992 Dependence on renal dialysis: Secondary | ICD-10-CM | POA: Diagnosis not present

## 2019-01-02 DIAGNOSIS — N2581 Secondary hyperparathyroidism of renal origin: Secondary | ICD-10-CM | POA: Diagnosis not present

## 2019-01-02 DIAGNOSIS — N186 End stage renal disease: Secondary | ICD-10-CM | POA: Diagnosis not present

## 2019-01-02 DIAGNOSIS — D696 Thrombocytopenia, unspecified: Secondary | ICD-10-CM | POA: Diagnosis not present

## 2019-01-05 DIAGNOSIS — Z992 Dependence on renal dialysis: Secondary | ICD-10-CM | POA: Diagnosis not present

## 2019-01-05 DIAGNOSIS — D509 Iron deficiency anemia, unspecified: Secondary | ICD-10-CM | POA: Diagnosis not present

## 2019-01-05 DIAGNOSIS — N186 End stage renal disease: Secondary | ICD-10-CM | POA: Diagnosis not present

## 2019-01-05 DIAGNOSIS — D696 Thrombocytopenia, unspecified: Secondary | ICD-10-CM | POA: Diagnosis not present

## 2019-01-05 DIAGNOSIS — N2581 Secondary hyperparathyroidism of renal origin: Secondary | ICD-10-CM | POA: Diagnosis not present

## 2019-01-07 DIAGNOSIS — N186 End stage renal disease: Secondary | ICD-10-CM | POA: Diagnosis not present

## 2019-01-07 DIAGNOSIS — D509 Iron deficiency anemia, unspecified: Secondary | ICD-10-CM | POA: Diagnosis not present

## 2019-01-07 DIAGNOSIS — N2581 Secondary hyperparathyroidism of renal origin: Secondary | ICD-10-CM | POA: Diagnosis not present

## 2019-01-07 DIAGNOSIS — D696 Thrombocytopenia, unspecified: Secondary | ICD-10-CM | POA: Diagnosis not present

## 2019-01-07 DIAGNOSIS — Z992 Dependence on renal dialysis: Secondary | ICD-10-CM | POA: Diagnosis not present

## 2019-01-09 DIAGNOSIS — N186 End stage renal disease: Secondary | ICD-10-CM | POA: Diagnosis not present

## 2019-01-09 DIAGNOSIS — Z992 Dependence on renal dialysis: Secondary | ICD-10-CM | POA: Diagnosis not present

## 2019-01-09 DIAGNOSIS — N2581 Secondary hyperparathyroidism of renal origin: Secondary | ICD-10-CM | POA: Diagnosis not present

## 2019-01-09 DIAGNOSIS — D509 Iron deficiency anemia, unspecified: Secondary | ICD-10-CM | POA: Diagnosis not present

## 2019-01-09 DIAGNOSIS — D696 Thrombocytopenia, unspecified: Secondary | ICD-10-CM | POA: Diagnosis not present

## 2019-01-11 DIAGNOSIS — I129 Hypertensive chronic kidney disease with stage 1 through stage 4 chronic kidney disease, or unspecified chronic kidney disease: Secondary | ICD-10-CM | POA: Diagnosis not present

## 2019-01-11 DIAGNOSIS — Z992 Dependence on renal dialysis: Secondary | ICD-10-CM | POA: Diagnosis not present

## 2019-01-11 DIAGNOSIS — N186 End stage renal disease: Secondary | ICD-10-CM | POA: Diagnosis not present

## 2019-01-12 ENCOUNTER — Ambulatory Visit: Payer: Medicare Other | Admitting: Family Medicine

## 2019-01-12 DIAGNOSIS — N2581 Secondary hyperparathyroidism of renal origin: Secondary | ICD-10-CM | POA: Diagnosis not present

## 2019-01-12 DIAGNOSIS — N186 End stage renal disease: Secondary | ICD-10-CM | POA: Diagnosis not present

## 2019-01-12 DIAGNOSIS — D509 Iron deficiency anemia, unspecified: Secondary | ICD-10-CM | POA: Diagnosis not present

## 2019-01-12 DIAGNOSIS — Z992 Dependence on renal dialysis: Secondary | ICD-10-CM | POA: Diagnosis not present

## 2019-01-12 DIAGNOSIS — R519 Headache, unspecified: Secondary | ICD-10-CM | POA: Insufficient documentation

## 2019-01-14 DIAGNOSIS — Z992 Dependence on renal dialysis: Secondary | ICD-10-CM | POA: Diagnosis not present

## 2019-01-14 DIAGNOSIS — D509 Iron deficiency anemia, unspecified: Secondary | ICD-10-CM | POA: Diagnosis not present

## 2019-01-14 DIAGNOSIS — N2581 Secondary hyperparathyroidism of renal origin: Secondary | ICD-10-CM | POA: Diagnosis not present

## 2019-01-14 DIAGNOSIS — N186 End stage renal disease: Secondary | ICD-10-CM | POA: Diagnosis not present

## 2019-01-16 DIAGNOSIS — N2581 Secondary hyperparathyroidism of renal origin: Secondary | ICD-10-CM | POA: Diagnosis not present

## 2019-01-16 DIAGNOSIS — Z992 Dependence on renal dialysis: Secondary | ICD-10-CM | POA: Diagnosis not present

## 2019-01-16 DIAGNOSIS — D509 Iron deficiency anemia, unspecified: Secondary | ICD-10-CM | POA: Diagnosis not present

## 2019-01-16 DIAGNOSIS — N186 End stage renal disease: Secondary | ICD-10-CM | POA: Diagnosis not present

## 2019-01-19 DIAGNOSIS — N2581 Secondary hyperparathyroidism of renal origin: Secondary | ICD-10-CM | POA: Diagnosis not present

## 2019-01-19 DIAGNOSIS — D509 Iron deficiency anemia, unspecified: Secondary | ICD-10-CM | POA: Diagnosis not present

## 2019-01-19 DIAGNOSIS — Z992 Dependence on renal dialysis: Secondary | ICD-10-CM | POA: Diagnosis not present

## 2019-01-19 DIAGNOSIS — N186 End stage renal disease: Secondary | ICD-10-CM | POA: Diagnosis not present

## 2019-01-20 DIAGNOSIS — N186 End stage renal disease: Secondary | ICD-10-CM | POA: Diagnosis not present

## 2019-01-20 DIAGNOSIS — Z992 Dependence on renal dialysis: Secondary | ICD-10-CM | POA: Diagnosis not present

## 2019-01-20 DIAGNOSIS — I771 Stricture of artery: Secondary | ICD-10-CM | POA: Diagnosis not present

## 2019-01-20 DIAGNOSIS — I871 Compression of vein: Secondary | ICD-10-CM | POA: Diagnosis not present

## 2019-01-21 DIAGNOSIS — N186 End stage renal disease: Secondary | ICD-10-CM | POA: Diagnosis not present

## 2019-01-21 DIAGNOSIS — D509 Iron deficiency anemia, unspecified: Secondary | ICD-10-CM | POA: Diagnosis not present

## 2019-01-21 DIAGNOSIS — N2581 Secondary hyperparathyroidism of renal origin: Secondary | ICD-10-CM | POA: Diagnosis not present

## 2019-01-21 DIAGNOSIS — Z992 Dependence on renal dialysis: Secondary | ICD-10-CM | POA: Diagnosis not present

## 2019-01-23 DIAGNOSIS — Z992 Dependence on renal dialysis: Secondary | ICD-10-CM | POA: Diagnosis not present

## 2019-01-23 DIAGNOSIS — D509 Iron deficiency anemia, unspecified: Secondary | ICD-10-CM | POA: Diagnosis not present

## 2019-01-23 DIAGNOSIS — N2581 Secondary hyperparathyroidism of renal origin: Secondary | ICD-10-CM | POA: Diagnosis not present

## 2019-01-23 DIAGNOSIS — N186 End stage renal disease: Secondary | ICD-10-CM | POA: Diagnosis not present

## 2019-01-26 DIAGNOSIS — D509 Iron deficiency anemia, unspecified: Secondary | ICD-10-CM | POA: Diagnosis not present

## 2019-01-26 DIAGNOSIS — Z992 Dependence on renal dialysis: Secondary | ICD-10-CM | POA: Diagnosis not present

## 2019-01-26 DIAGNOSIS — N186 End stage renal disease: Secondary | ICD-10-CM | POA: Diagnosis not present

## 2019-01-26 DIAGNOSIS — N2581 Secondary hyperparathyroidism of renal origin: Secondary | ICD-10-CM | POA: Diagnosis not present

## 2019-01-28 DIAGNOSIS — D509 Iron deficiency anemia, unspecified: Secondary | ICD-10-CM | POA: Diagnosis not present

## 2019-01-28 DIAGNOSIS — N186 End stage renal disease: Secondary | ICD-10-CM | POA: Diagnosis not present

## 2019-01-28 DIAGNOSIS — N2581 Secondary hyperparathyroidism of renal origin: Secondary | ICD-10-CM | POA: Diagnosis not present

## 2019-01-28 DIAGNOSIS — Z992 Dependence on renal dialysis: Secondary | ICD-10-CM | POA: Diagnosis not present

## 2019-01-29 DIAGNOSIS — M545 Low back pain: Secondary | ICD-10-CM | POA: Diagnosis not present

## 2019-01-29 DIAGNOSIS — M5417 Radiculopathy, lumbosacral region: Secondary | ICD-10-CM | POA: Diagnosis not present

## 2019-01-30 DIAGNOSIS — N2581 Secondary hyperparathyroidism of renal origin: Secondary | ICD-10-CM | POA: Diagnosis not present

## 2019-01-30 DIAGNOSIS — Z992 Dependence on renal dialysis: Secondary | ICD-10-CM | POA: Diagnosis not present

## 2019-01-30 DIAGNOSIS — N186 End stage renal disease: Secondary | ICD-10-CM | POA: Diagnosis not present

## 2019-01-30 DIAGNOSIS — D509 Iron deficiency anemia, unspecified: Secondary | ICD-10-CM | POA: Diagnosis not present

## 2019-02-01 DIAGNOSIS — D509 Iron deficiency anemia, unspecified: Secondary | ICD-10-CM | POA: Diagnosis not present

## 2019-02-01 DIAGNOSIS — N186 End stage renal disease: Secondary | ICD-10-CM | POA: Diagnosis not present

## 2019-02-01 DIAGNOSIS — N2581 Secondary hyperparathyroidism of renal origin: Secondary | ICD-10-CM | POA: Diagnosis not present

## 2019-02-01 DIAGNOSIS — Z992 Dependence on renal dialysis: Secondary | ICD-10-CM | POA: Diagnosis not present

## 2019-02-03 DIAGNOSIS — Z992 Dependence on renal dialysis: Secondary | ICD-10-CM | POA: Diagnosis not present

## 2019-02-03 DIAGNOSIS — D509 Iron deficiency anemia, unspecified: Secondary | ICD-10-CM | POA: Diagnosis not present

## 2019-02-03 DIAGNOSIS — N2581 Secondary hyperparathyroidism of renal origin: Secondary | ICD-10-CM | POA: Diagnosis not present

## 2019-02-03 DIAGNOSIS — N186 End stage renal disease: Secondary | ICD-10-CM | POA: Diagnosis not present

## 2019-02-06 DIAGNOSIS — Z992 Dependence on renal dialysis: Secondary | ICD-10-CM | POA: Diagnosis not present

## 2019-02-06 DIAGNOSIS — N186 End stage renal disease: Secondary | ICD-10-CM | POA: Diagnosis not present

## 2019-02-06 DIAGNOSIS — D509 Iron deficiency anemia, unspecified: Secondary | ICD-10-CM | POA: Diagnosis not present

## 2019-02-06 DIAGNOSIS — N2581 Secondary hyperparathyroidism of renal origin: Secondary | ICD-10-CM | POA: Diagnosis not present

## 2019-02-09 DIAGNOSIS — N2581 Secondary hyperparathyroidism of renal origin: Secondary | ICD-10-CM | POA: Diagnosis not present

## 2019-02-09 DIAGNOSIS — D509 Iron deficiency anemia, unspecified: Secondary | ICD-10-CM | POA: Diagnosis not present

## 2019-02-09 DIAGNOSIS — Z992 Dependence on renal dialysis: Secondary | ICD-10-CM | POA: Diagnosis not present

## 2019-02-09 DIAGNOSIS — N186 End stage renal disease: Secondary | ICD-10-CM | POA: Diagnosis not present

## 2019-02-10 DIAGNOSIS — M5416 Radiculopathy, lumbar region: Secondary | ICD-10-CM | POA: Diagnosis not present

## 2019-02-13 ENCOUNTER — Other Ambulatory Visit: Payer: Self-pay | Admitting: Cardiovascular Disease

## 2019-02-13 MED ORDER — CLOPIDOGREL BISULFATE 75 MG PO TABS: 75.0000 mg | ORAL_TABLET | Freq: Every day | ORAL | 2 refills | Status: DC

## 2019-02-13 MED ORDER — AMIODARONE HCL 200 MG PO TABS: 200.0000 mg | ORAL_TABLET | Freq: Every day | ORAL | 2 refills | Status: DC

## 2019-02-13 NOTE — Telephone Encounter (Signed)
Pt's medications were sent to pt's pharmacy as requested. Confirmation received.  

## 2019-02-13 NOTE — Telephone Encounter (Signed)
°*  STAT* If patient is at the pharmacy, call can be transferred to refill team.   1. Which medications need to be refilled? (please list name of each medication and dose if known)   amiodarone (PACERONE) 200 MG tablet    clopidogrel (PLAVIX) 75 MG tablet   2. Which pharmacy/location (including street and city if local pharmacy) is medication to be sent to? [facility], [place]  3. Do they need a 30 day or 90 day supply? 90 day

## 2019-02-20 ENCOUNTER — Telehealth: Payer: Self-pay | Admitting: Hematology

## 2019-02-20 DIAGNOSIS — M62830 Muscle spasm of back: Secondary | ICD-10-CM | POA: Diagnosis not present

## 2019-02-20 DIAGNOSIS — M5416 Radiculopathy, lumbar region: Secondary | ICD-10-CM | POA: Diagnosis not present

## 2019-02-20 NOTE — Telephone Encounter (Signed)
[place] PAL 12/24 moved lab/fu to 1/6. Confirmed with patient.

## 2019-02-25 ENCOUNTER — Telehealth: Payer: Self-pay | Admitting: *Deleted

## 2019-02-25 NOTE — Telephone Encounter (Signed)
   [facility] Pre-operative Risk Assessment    Request for surgical clearance:  1. What type of surgery is being performed? ESI   2. When is this surgery scheduled? TBD   3. What type of clearance is required (medical clearance vs. Pharmacy clearance to hold med vs. Both)? MEDICAL  4. Are there any medications that need to be held prior to surgery and how long? PLAVIX X 5 DAY PRIOR TO INJECTION   5. Practice name and name of physician performing surgery? [facility]; DR. Nelva Bush   6. What is your office phone number [phone]    7.   What is your office fax number [phone] ATTN: SUSAN  8.   Anesthesia type (None, local, MAC, general) ? NOT LISTED   Julaine Hua 02/25/2019, 9:48 AM  _________________________________________________________________   (provider comments below)

## 2019-02-25 NOTE — Telephone Encounter (Signed)
OK to hold Plavix prior to his procedure. Jose Garrett

## 2019-02-25 NOTE — Telephone Encounter (Signed)
I could not route this back to the preop pool. The change in Epic has changed my screen. Will have to learn how to do that.

## 2019-02-25 NOTE — Telephone Encounter (Signed)
Dr. Angelena Form  Can you please address holding this patient's Plavix therapy for 5 days prior to [facility]?  Patient has a history of ESRD on HD, AL amyloid (kidney involvement), HTN, HLD, GERD, anemia, thrombocytopenia, ischemic cardiomyopathy, chronic combined CHF, CAD,RBBB,carotid artery disease, PAD w/ left femoral to below the knee popliteal bypass with synthetic graft, s/p toe amputations, left groin seroma, and severe AS s/p TAVR (10/21/18).  He was last seen in follow-up with Nell Range 11/12/2018 at which time he was improved symptomatically with plans for follow-up with echocardiogram in 1 year.   Please send your recommendations to the preoperative pool  Thank you Sharee Pimple

## 2019-02-25 NOTE — Telephone Encounter (Signed)
LVM 12/16

## 2019-02-26 ENCOUNTER — Encounter: Payer: Self-pay | Admitting: Cardiovascular Disease

## 2019-02-26 ENCOUNTER — Other Ambulatory Visit: Payer: Self-pay

## 2019-02-26 ENCOUNTER — Ambulatory Visit (INDEPENDENT_AMBULATORY_CARE_PROVIDER_SITE_OTHER): Payer: Medicare Other | Admitting: Cardiovascular Disease

## 2019-02-26 VITALS — BP 136/60 | HR 72 | Ht 70.0 in | Wt 175.0 lb

## 2019-02-26 DIAGNOSIS — Z952 Presence of prosthetic heart valve: Secondary | ICD-10-CM

## 2019-02-26 DIAGNOSIS — I10 Essential (primary) hypertension: Secondary | ICD-10-CM

## 2019-02-26 DIAGNOSIS — I350 Nonrheumatic aortic (valve) stenosis: Secondary | ICD-10-CM | POA: Diagnosis not present

## 2019-02-26 DIAGNOSIS — I2510 Atherosclerotic heart disease of native coronary artery without angina pectoris: Secondary | ICD-10-CM | POA: Diagnosis not present

## 2019-02-26 DIAGNOSIS — I779 Disorder of arteries and arterioles, unspecified: Secondary | ICD-10-CM

## 2019-02-26 DIAGNOSIS — I4729 Other ventricular tachycardia: Secondary | ICD-10-CM

## 2019-02-26 DIAGNOSIS — I472 Ventricular tachycardia: Secondary | ICD-10-CM | POA: Diagnosis not present

## 2019-02-26 DIAGNOSIS — E7800 Pure hypercholesterolemia, unspecified: Secondary | ICD-10-CM

## 2019-02-26 NOTE — Patient Instructions (Signed)
Medication Instructions:  No changes *If you need a refill on your cardiac medications before your next appointment, please call your pharmacy*  Lab Work: Fasting lipids and liver panel If you have labs (blood work) drawn today and your tests are completely normal, you will receive your results only by: [name] MyChart Message (if you have MyChart) OR . A paper copy in the mail If you have any lab test that is abnormal or we need to change your treatment, we will call you to review the results.  Testing/Procedures: none  Follow-Up: At [facility], you and your health needs are our priority.  As part of our continuing mission to provide you with exceptional heart care, we have created designated Provider Care Teams.  These Care Teams include your primary Cardiologist (physician) and Advanced Practice Providers (APPs -  Physician Assistants and Nurse Practitioners) who all work together to provide you with the care you need, when you need it.  Your next appointment:   8 month(s) (august for 1 year TAVR follow up)  The format for your next appointment:   Either In Person or Virtual  Provider:   Nell Range, PA-C  Other Instructions

## 2019-02-26 NOTE — Progress Notes (Signed)
Chief Complaint  Patient presents with  . Follow-up    aortic valve disease   History of Present Illness: 68 y.o. male with history of ESRD on HD, AL amyloid (kidney involvement), aortic stenosis s/p TAVR, HTN, HLD, GERD, anemia, chronic combined CHF and CAD who is here today for cardiac follow up. He was admitted to [facility] September 2019 with acute respiratory failure in setting of hypertensive urgency. His troponin was elevated and his EKG showed diffuse ST depression. Cardiac cath 11/25/17 with occlusion of the RCA which was felt to be the culprit but this vessel could not be engaged. The distal RCA filled from collaterals. There was moderate disease in the ostial Circumflex and mild disease in the LAD. Medical therapy was recommended. LVEF=35-40%. There was moderate aortic stenosis. Echo on 12/19/17 with LVEF=65-70%. Moderate AS (mean gradient 31 mmHg). His amyloid is followed by oncology. He was admitted to [facility] January 2020 with dyspnea. Cardiac cath 03/25/18 with stable CAD. Moderate AS by cath. In May 2020 the patient was hospitalized with critical limb ischemia involving his left lower extremity with 2 gangrenous toes. He was found to have bilateral chronic occlusion of the superficial femoral arteries as well as the left popliteal artery. He underwent left common femoral to below-knee popliteal artery bypass graft using synthetic Gore-Tex graft. Postoperatively,he suffered an acute exacerbation of chronic combined systolic and diastolic congestive heart failure with mildly positive troponins consistent with NSTEMI. Catheterization revealed no significant change in his coronary artery disease with chronic occlusion of the right coronary artery and no significant disease in the left coronary circulation. Echocardiogram revealed further drop in left ventricular systolic function with ejection fraction estimated 35 to 40%. There was at least moderate and probably severe aortic stenosis. Peak  velocity across aortic valve measured as high as 3.7 m/s corresponding to mean transvalvular gradient estimated 30 mmHg and aortic valve area calculated 0.87 cm. The DVI was notably only 0.20. His subsequent recovery was slow and he later underwent amputation of his gangrenous toes. He had a prolonged recovery requiring inpatient rehab. He developed a large seroma in the left groin associated with his surgical incision which has been treated conservatively. More recently he has been having problems tolerating dialysis with tendency to get hypotension towards the completion of dialysis treatments.He underwent successful TAVR with a 26 mm Edwards Sapien Ultra THV via the R TF approach on 10/21/18. Post operative echo showed persistent moderate to severe LV systolic dysfunction (EF 96-28%) with normally functioning TAVR with no AI and mean gradient 16 mmHg. There was also mild RVE with mildly reduced function and a small pericardial effusion. He was started on aspirin and plavix, but aspirin later discontinued given worsening thrombocytopenia. Frequent runs of polymorphic VT was noted on tele. He was started on amiodarone and LifeVest was placed. Echo September 2020 with LVEF=35-40%. The Lifevest was stopped.   He is here today for follow up. The patient denies any chest pain, dyspnea, palpitations, lower extremity edema, orthopnea, PND, dizziness, near syncope or syncope.    Primary Care Physician: [name], MD  Past Medical History:  Diagnosis Date  . Amyloidosis ([place])   . Anemia of chronic disease   . Anxiety   . CAD (coronary artery disease)    a. cardiac cath on 11/25/17 with occlusion of the RCA which could have been his event but the RCA could not be engaged. The RCA filled distally from left to right collaterals. There were no severe stenoses in the left  coronary system. Medical therapy recommended, started on plavix.  . Chronic combined systolic and diastolic CHF (congestive heart  failure) ([place])    a. EF dropped to 35-40% with grade 2 DD by echo 11/2017.  [name] DJD (degenerative joint disease), lumbar   . ESRD (end stage renal disease) ([place])    ESRD- HEMO MWF- [facility]  . GERD (gastroesophageal reflux disease)   . Hyperlipidemia   . Hypertension   . Neuropathy   . Peripheral vascular disease ([place])   . Restless legs   . S/P TAVR (transcatheter aortic valve replacement) 10/21/2018   26 mm Edwards Sapien 3 Ultra transcatheter heart valve placed via percutaneous right transfemoral approach   . Severe aortic stenosis    severe    Past Surgical History:  Procedure Laterality Date  . ABDOMINAL AORTOGRAM W/LOWER EXTREMITY Bilateral 07/16/2018   Procedure: ABDOMINAL AORTOGRAM W/LOWER EXTREMITY;  Surgeon: Wellington Hampshire, MD;  Location: [place] [facility];  Service: Cardiovascular;  Laterality: Bilateral;  . AMPUTATION Left 08/19/2018   Procedure: AMPUTATION LEFT TOES SECOND AND THIRD;  Surgeon: Waynetta Sandy, MD;  Location: [place];  Service: Vascular;  Laterality: Left;  . AV FISTULA PLACEMENT Right 12/27/2015   Procedure: Right Arm ARTERIOVENOUS (AV) FISTULA CREATION;  Surgeon: Angelia Mould, MD;  Location: [place];  Service: Vascular;  Laterality: Right;  . CATARACT EXTRACTION W/ INTRAOCULAR LENS  IMPLANT, BILATERAL    . COLONOSCOPY    . ERCP W/ SPHICTEROTOMY    . FEMORAL-POPLITEAL BYPASS GRAFT Left 07/22/2018   Procedure: left FEMORAL TO BELOW KNEE POPLITEAL ARTERY BYPASS GREAFT using 17mm removable ring propaten gore graft;  Surgeon: Waynetta Sandy, MD;  Location: [place];  Service: Vascular;  Laterality: Left;  . INTRAOPERATIVE TRANSTHORACIC ECHOCARDIOGRAM N/A 10/21/2018   Procedure: Intraoperative Transthoracic Echocardiogram;  Surgeon: Sherren Mocha, MD;  Location: [place];  Service: Open Heart Surgery;  Laterality: N/A;  . IR GENERIC HISTORICAL  01/27/2016   IR FLUORO GUIDE CV LINE RIGHT 01/27/2016 Arne Cleveland, MD [facility]  . IR  GENERIC HISTORICAL  01/27/2016   IR US GUIDE VASC ACCESS RIGHT 01/27/2016 Arne Cleveland, MD [facility]  . LAPAROSCOPIC CHOLECYSTECTOMY  03/2011  . LAPAROSCOPIC CHOLECYSTECTOMY    . LEFT HEART CATH AND CORONARY ANGIOGRAPHY N/A 11/25/2017   Procedure: LEFT HEART CATH AND CORONARY ANGIOGRAPHY;  Surgeon: Martinique, Peter M, MD;  Location: [place] [facility];  Service: Cardiovascular;  Laterality: N/A;  . LEFT HEART CATH AND CORONARY ANGIOGRAPHY N/A 07/24/2018   Procedure: LEFT HEART CATH AND CORONARY ANGIOGRAPHY;  Surgeon: Sherren Mocha, MD;  Location: [place] [facility];  Service: Cardiovascular;  Laterality: N/A;  . RENAL BIOPSY, PERCUTANEOUS Right 09/08/2014  . RIGHT/LEFT HEART CATH AND CORONARY ANGIOGRAPHY N/A 03/25/2018   Procedure: RIGHT/LEFT HEART CATH AND CORONARY ANGIOGRAPHY;  Surgeon: Martinique, Peter M, MD;  Location: [place] [facility];  Service: Cardiovascular;  Laterality: N/A;  . TOE AMPUTATION Left 08/19/2018   2nd & 3rd toes  . TONSILLECTOMY  ~ 1960  . TRANSCATHETER AORTIC VALVE REPLACEMENT, TRANSFEMORAL N/A 10/21/2018   Procedure: TRANSCATHETER AORTIC VALVE REPLACEMENT, TRANSFEMORAL;  Surgeon: Sherren Mocha, MD;  Location: [place];  Service: Open Heart Surgery;  Laterality: N/A;  . ULTRASOUND GUIDANCE FOR VASCULAR ACCESS  03/25/2018   Procedure: Ultrasound Guidance For Vascular Access;  Surgeon: Martinique, Peter M, MD;  Location: [place] [facility];  Service: Cardiovascular;;    Current Outpatient Medications  Medication Sig Dispense Refill  . acetaminophen (TYLENOL) 500  MG tablet Take 500-1,000 mg by mouth daily as needed for moderate pain.     [name] amiodarone (PACERONE) 200 MG tablet Take 1 tablet (200 mg total) by mouth daily. 90 tablet 2  . atorvastatin (LIPITOR) 80 MG tablet Take 1 tablet (80 mg total) by mouth daily with supper. 30 tablet 0  . carvedilol (COREG) 3.125 MG tablet Take 1 tablet (3.125 mg total) by mouth 2 (two) times daily. 180 tablet 3  . clopidogrel (PLAVIX) 75 MG  tablet Take 1 tablet (75 mg total) by mouth daily with supper. 90 tablet 2  . LORazepam (ATIVAN) 1 MG tablet Take 1 mg by mouth 2 (two) times daily as needed for anxiety.    . methocarbamol (ROBAXIN) 500 MG tablet Take 500 mg by mouth 2 (two) times daily as needed for mild pain.    . midodrine (PROAMATINE) 10 MG tablet Take 1 tablet (10 mg total) by mouth every Monday, Wednesday, and Friday with hemodialysis. 30 tablet 0  . multivitamin (RENA-VIT) TABS tablet Take 1 tablet by mouth at bedtime. 30 tablet 0  . mupirocin ointment (BACTROBAN) 2 % Apply 1 application topically daily as needed (irritation).     [name] oxyCODONE (OXY IR/ROXICODONE) 5 MG immediate release tablet Take 1-2 tablets (5-10 mg total) by mouth every 4 (four) hours as needed for moderate pain. 30 tablet 0  . sucroferric oxyhydroxide (VELPHORO) 500 MG chewable tablet Chew 2 tablets (1,000 mg total) by mouth 3 (three) times daily with meals. 90 tablet 0  . vitamin B-12 (CYANOCOBALAMIN) 1000 MCG tablet Take 1,000 mcg by mouth every evening.      No current facility-administered medications for this visit.    No Known Allergies  Social History   Socioeconomic History  . Marital status: Divorced    Spouse name: Not on file  . Number of children: Not on file  . Years of education: Not on file  . Highest education level: Not on file  Occupational History  . Not on file  Tobacco Use  . Smoking status: Former Smoker    Packs/day: 2.00    Years: 40.00    Pack years: 80.00    Types: Cigarettes    Quit date: 03/19/2011    Years since quitting: 7.9  . Smokeless tobacco: Never Used  Substance and Sexual Activity  . Alcohol use: No    Alcohol/week: 0.0 standard drinks  . Drug use: No    Comment:  "I have used marijuana till the 1990's". Notes that he quit 15 yrs ago  . Sexual activity: Not Currently  Other Topics Concern  . Not on file  Social History Narrative   Pt lives alone in a 1 story home   Has 3 adult daughters    Highest level of education: associates degree   Worked in [employer], broadcasting/film/video.    Social Determinants of Health   Financial Resource Strain:   . Difficulty of Paying Living Expenses: Not on file  Food Insecurity:   . Worried About Charity fundraiser in the Last Year: Not on file  . Ran Out of Food in the Last Year: Not on file  Transportation Needs:   . Lack of Transportation (Medical): Not on file  . Lack of Transportation (Non-Medical): Not on file  Physical Activity:   . Days of Exercise per Week: Not on file  . Minutes of Exercise per Session: Not on file  Stress:   . Feeling of Stress : Not on file  Social  Connections:   . Frequency of Communication with Friends and Family: Not on file  . Frequency of Social Gatherings with Friends and Family: Not on file  . Attends Religious Services: Not on file  . Active Member of Clubs or Organizations: Not on file  . Attends Archivist Meetings: Not on file  . Marital Status: Not on file  Intimate Partner Violence:   . Fear of Current or Ex-Partner: Not on file  . Emotionally Abused: Not on file  . Physically Abused: Not on file  . Sexually Abused: Not on file    Family History  Problem Relation Age of Onset  . Hypertension Mother   . Diabetes Mother   . Heart disease Mother   . Skin cancer Mother   . Hypertension Father   . Diabetes Father   . Diabetes Sister     Review of Systems:  As stated in the HPI and otherwise negative.   BP 136/60   Pulse 72   Ht 5\' 10"  (1.778 m)   Wt 175 lb (79.4 kg)   SpO2 95%   BMI 25.11 kg/m   Physical Examination: General: Well developed, well nourished, NAD  HEENT: OP clear, mucus membranes moist  SKIN: warm, dry. No rashes. Neuro: No focal deficits  Musculoskeletal: Muscle strength 5/5 all ext  Psychiatric: Mood and affect normal  Neck: No JVD, no carotid bruits, no thyromegaly, no lymphadenopathy.  Lungs:Clear bilaterally, no wheezes, rhonci,  crackles Cardiovascular: Regular rate and rhythm. No murmurs, gallops or rubs. Abdomen:Soft. Bowel sounds present. Non-tender.  Extremities: No lower extremity edema. Pulses are 2 + in the bilateral DP/PT.  Echo 11/11/18:  1. The left ventricle has moderately reduced systolic function, with an ejection fraction of 35-40%. The cavity size was normal. There is mildly increased left ventricular wall thickness. Left ventricular diastolic Doppler parameters are consistent with  pseudonormalization.  2. The right ventricle has normal systolic function. The cavity was normal. There is no increase in right ventricular wall thickness.  3. Left atrial size was mildly dilated.  4. Mitral valve regurgitation is mild to moderate by color flow Doppler.  5. A Edwards Sapien bioprosthetic aortic valve (TAVR) valve is present in the aortic position.  6. No stenosis of the aortic valve.  7. The aorta is normal unless otherwise noted.  8. The aortic root and ascending aorta are normal in size and structure.  9. The atrial septum is grossly normal. 10. The average left ventricular global longitudinal strain is -7.5 %.  EKG:  EKG is not ordered today. The ekg ordered today demonstrates   Recent Labs: 10/16/2018: ALT 13 10/17/2018: B Natriuretic Peptide 1,967.8 10/18/2018: TSH 1.662 10/23/2018: Magnesium 2.2 10/27/2018: BUN 54; Creatinine, Ser 9.21; Potassium 4.6; Sodium 137 11/12/2018: Hemoglobin 11.4; Platelets 51   Lipid Panel    Component Value Date/Time   CHOL 117 02/12/2018 0823   TRIG 174 (H) 02/12/2018 0823   HDL 32 (L) 02/12/2018 0823   CHOLHDL 3.7 02/12/2018 0823   CHOLHDL 7.4 02/25/2014 0959   VLDL 40 02/25/2014 0959   LDLCALC 50 02/12/2018 0823     Wt Readings from Last 3 Encounters:  02/26/19 175 lb (79.4 kg)  11/12/18 176 lb 1.9 oz (79.9 kg)  10/27/18 182 lb 1.6 oz (82.6 kg)     Other studies Reviewed: Additional studies/ records that were reviewed today include:  Review of the above  records demonstrates:    Assessment and Plan:   1. CAD without angina: Cardiac  cath January 2020 with stable CAD, known chronic occlusion of the RCA. No chest pain. Continue Plavix, Coreg and statin.    2. Ischemic Cardiomyopathy/Chronic combined CHF: LVEF=35-40% by echo September 2020. Volume control by dialysis. Continue Coreg. No Ace-inh or ARB due to hypotension on dialysis. Volume status is ok today.    3. Aortic stenosis: He is s/p TAVR in August 2020. Continue Plavix and SBE prophylaxis.    4. HTN: BP controlled. Followed in Nephrology  5. Carotid artery disease: Mild bilateral carotid artery disease by dopplers October 2019.   6. ESRD: on HD.   7. Hyperlipidemia: LDL at goal in 2019. Continue statin. Check lipids and LFTs now.    8. NSVT: No palpitations. Continue amiodarone.   Current medicines are reviewed at length with the patient today.  The patient does not have concerns regarding medicines.  The following changes have been made:  no change  Labs/ tests ordered today include:   Orders Placed This Encounter  Procedures  . Hepatic function panel  . Lipid Profile     Disposition:   FU with Angelena Form, PA-C in August 2021.    Signed, Lauree Chandler, MD 02/26/2019 2:04 PM    [facility] [address], [place], [place]  [address] Phone: [phone]; Fax: [phone]

## 2019-02-27 NOTE — Telephone Encounter (Signed)
   Primary Cardiologist: Lauree Chandler, MD  Chart reviewed as part of pre-operative protocol coverage. Given past medical history and time since last visit, based on ACC/AHA guidelines, Jose Garrett would be at acceptable risk for the planned procedure without further cardiovascular testing.   Per his primary cardiologist, Dr. Angelena Form, he may hold Plavix 5 days prior to his procedure.  Office visits 02/26/2027 with Dr. Angelena Form -doing well and no changes made.  I will route this recommendation to the requesting party via Epic fax function and remove from pre-op pool.  Called patient and informed of recommendations.    Please call with questions.  Loel Dubonnet, NP 02/27/2019, 9:44 AM

## 2019-03-03 ENCOUNTER — Other Ambulatory Visit: Payer: Medicare Other

## 2019-03-05 ENCOUNTER — Other Ambulatory Visit: Payer: Medicare Other

## 2019-03-05 ENCOUNTER — Ambulatory Visit: Payer: Medicare Other | Admitting: Hematology

## 2019-03-18 ENCOUNTER — Inpatient Hospital Stay: Payer: Medicare Other | Admitting: Hematology

## 2019-03-18 ENCOUNTER — Inpatient Hospital Stay: Payer: Medicare Other

## 2019-03-18 ENCOUNTER — Telehealth: Payer: Self-pay | Admitting: Hematology

## 2019-03-18 NOTE — Telephone Encounter (Signed)
Returned patient's phone call regarding cancelling 01/06 appointments, per patient's request appointments cancelled.

## 2019-03-31 ENCOUNTER — Encounter: Payer: Self-pay | Admitting: Cardiovascular Disease

## 2019-03-31 ENCOUNTER — Other Ambulatory Visit: Payer: Self-pay

## 2019-03-31 ENCOUNTER — Ambulatory Visit: Payer: Medicare Other | Admitting: Cardiovascular Disease

## 2019-03-31 VITALS — BP 116/62 | HR 80 | Temp 97.1°F | Ht 70.0 in | Wt 174.8 lb

## 2019-03-31 DIAGNOSIS — E785 Hyperlipidemia, unspecified: Secondary | ICD-10-CM

## 2019-03-31 DIAGNOSIS — I739 Peripheral vascular disease, unspecified: Secondary | ICD-10-CM

## 2019-03-31 DIAGNOSIS — N186 End stage renal disease: Secondary | ICD-10-CM

## 2019-03-31 DIAGNOSIS — I2510 Atherosclerotic heart disease of native coronary artery without angina pectoris: Secondary | ICD-10-CM

## 2019-03-31 DIAGNOSIS — I5022 Chronic systolic (congestive) heart failure: Secondary | ICD-10-CM

## 2019-03-31 DIAGNOSIS — I350 Nonrheumatic aortic (valve) stenosis: Secondary | ICD-10-CM

## 2019-03-31 DIAGNOSIS — Z992 Dependence on renal dialysis: Secondary | ICD-10-CM

## 2019-03-31 NOTE — Patient Instructions (Signed)
Medication Instructions:  No changes *If you need a refill on your cardiac medications before your next appointment, please call your pharmacy*  Lab Work: None ordered If you have labs (blood work) drawn today and your tests are completely normal, you will receive your results only by: [name] MyChart Message (if you have MyChart) OR . A paper copy in the mail If you have any lab test that is abnormal or we need to change your treatment, we will call you to review the results.  Testing/Procedures: Your physician has requested that you have a lower extremity arterial duplex. During this test, ultrasound is used to evaluate arterial blood flow in the legs. Allow one hour for this exam. There are no restrictions or special instructions. This will take place at [place], [address].  Your physician has requested that you have an ankle brachial index (ABI). During this test an ultrasound and blood pressure cuff are used to evaluate the arteries that supply the arms and legs with blood. Allow thirty minutes for this exam. There are no restrictions or special instructions. This will take place at [place], [address].   Follow-Up: At [facility], you and your health needs are our priority.  As part of our continuing mission to provide you with exceptional heart care, we have created designated Provider Care Teams.  These Care Teams include your primary Cardiologist (physician) and Advanced Practice Providers (APPs -  Physician Assistants and Nurse Practitioners) who all work together to provide you with the care you need, when you need it.  Your next appointment:   6 month(s)  The format for your next appointment:   In Person  Provider:   Kathlyn Sacramento, MD

## 2019-03-31 NOTE — Progress Notes (Signed)
Cardiology Office Note   Date:  03/31/2019   ID:  KELLEY KNOTH, DOB 03-27-1950, MRN [id number]  PCP:  [name], MD  Cardiologist:   Kathlyn Sacramento, MD   No chief complaint on file.     History of Present Illness: Jose Garrett is a 69 y.o. male who presents for a follow-up visit regarding peripheral arterial disease and critical limb ischemia.  He has extensive medical problems including ESRD on HD, AL amyloid (kidney involvement), aortic stenosis, HTN, HLD, GERD, anemia, chronic combined CHF and CAD . He was admitted to [facility] September 2019 with acute respiratory failure in setting of hypertensive urgency. His troponin was elevated and his EKG showed diffuse ST depression. Cardiac cath 11/25/17 with occlusion of the RCA which was felt to be the culprit but this vessel could not be engaged. The distal RCA filled from collaterals. There was moderate disease in the ostial Circumflex and mild disease in the LAD. Medical therapy was recommended. LVEF=35-40%. There was moderate aortic stenosis. Echo on 12/19/17 with LVEF=65-70%. Moderate AS (mean gradient 31 mmHg).   His amyloid is followed by oncology whose notes indicate prior tx with CyBorD, Velcade, Ixazomib andDexamethasone. Bone marrow with no overt evidence of multiple myeloma. PET/CT scan showed no evidence of bony lesions or lymphadenopathy. He had neuropathy with some of his treatment measures so is no longer on chemotherapy.Last oncology note indicates serologic remission.  He was seen in 2020 for gangrenous changes to the left toes.He underwent noninvasive vascular studies which showed noncompressible vessels with no flow detected in the left toes and very dampened flow in the right toes.  Duplex showed bilateral SFA occlusion and in addition there was left popliteal artery occlusion and TP trunk occlusion.    He underwent left femoral to below the knee popliteal bypass with synthetic graft.  He had postoperative myocardial  infarction.  Cardiac catheterization showed no change in coronary anatomy.  It was felt that his aortic stenosis might have contributed.  He underwent toe amputations.  He had left groin seroma that was managed conservatively.  He ultimately underwent TAVR in August. He felt significant improvement in heart failure symptoms after that. He is doing well with no lower extremity ulceration. He has some right leg discomfort that is not lifestyle limiting.  Past Medical History:  Diagnosis Date  . Amyloidosis ([place])   . Anemia of chronic disease   . Anxiety   . CAD (coronary artery disease)    a. cardiac cath on 11/25/17 with occlusion of the RCA which could have been his event but the RCA could not be engaged. The RCA filled distally from left to right collaterals. There were no severe stenoses in the left coronary system. Medical therapy recommended, started on plavix.  . Chronic combined systolic and diastolic CHF (congestive heart failure) ([place])    a. EF dropped to 35-40% with grade 2 DD by echo 11/2017.  [name] DJD (degenerative joint disease), lumbar   . ESRD (end stage renal disease) ([place])    ESRD- HEMO MWF- [facility]  . GERD (gastroesophageal reflux disease)   . Hyperlipidemia   . Hypertension   . Neuropathy   . Peripheral vascular disease ([place])   . Restless legs   . S/P TAVR (transcatheter aortic valve replacement) 10/21/2018   26 mm Edwards Sapien 3 Ultra transcatheter heart valve placed via percutaneous right transfemoral approach   . Severe aortic stenosis    severe    Past Surgical History:  Procedure Laterality Date  . ABDOMINAL AORTOGRAM W/LOWER EXTREMITY Bilateral 07/16/2018   Procedure: ABDOMINAL AORTOGRAM W/LOWER EXTREMITY;  Surgeon: Wellington Hampshire, MD;  Location: [facility];  Service: Cardiovascular;  Laterality: Bilateral;  . AMPUTATION Left 08/19/2018   Procedure: AMPUTATION LEFT TOES SECOND AND THIRD;  Surgeon: Waynetta Sandy, MD;  Location: [facility];   Service: Vascular;  Laterality: Left;  . AV FISTULA PLACEMENT Right 12/27/2015   Procedure: Right Arm ARTERIOVENOUS (AV) FISTULA CREATION;  Surgeon: Angelia Mould, MD;  Location: [facility];  Service: Vascular;  Laterality: Right;  . CATARACT EXTRACTION W/ INTRAOCULAR LENS  IMPLANT, BILATERAL    . COLONOSCOPY    . ERCP W/ SPHICTEROTOMY    . FEMORAL-POPLITEAL BYPASS GRAFT Left 07/22/2018   Procedure: left FEMORAL TO BELOW KNEE POPLITEAL ARTERY BYPASS GREAFT using 58m removable ring propaten gore graft;  Surgeon: CWaynetta Sandy MD;  Location: [facility]  Service: Vascular;  Laterality: Left;  . INTRAOPERATIVE TRANSTHORACIC ECHOCARDIOGRAM N/A 10/21/2018   Procedure: Intraoperative Transthoracic Echocardiogram;  Surgeon: CSherren Mocha MD;  [facility]  Service: Open Heart Surgery;  Laterality: N/A;  . IR GENERIC HISTORICAL  01/27/2016   IR FLUORO GUIDE CV LINE RIGHT 01/27/2016 DArne Cleveland MD [facility]INTER[facility]  . IR GENERIC HISTORICAL  01/27/2016   IR [place]ASC ACCESS RIGHT 01/27/2016 DArne Cleveland MD [facility]  . LAPAROSCOPIC CHOLECYSTECTOMY  03/2011  . LAPAROSCOPIC CHOLECYSTECTOMY    . LEFT HEART CATH AND CORONARY ANGIOGRAPHY N/A 11/25/2017   Procedure: LEFT HEART CATH AND CORONARY ANGIOGRAPHY;  Surgeon: JMartinique Peter M, MD;  [facility];  Service: Cardiovascular;  Laterality: N/A;  . LEFT HEART CATH AND CORONARY ANGIOGRAPHY N/A 07/24/2018   Procedure: LEFT HEART CATH AND CORONARY ANGIOGRAPHY;  Surgeon: CSherren Mocha MD;  [facility];  Service: Cardiovascular;  Laterality: N/A;  . RENAL BIOPSY, PERCUTANEOUS Right 09/08/2014  . RIGHT/LEFT HEART CATH AND CORONARY ANGIOGRAPHY N/A 03/25/2018   Procedure: RIGHT/LEFT HEART CATH AND CORONARY ANGIOGRAPHY;  Surgeon: JMartinique Peter M, MD;  [facility];  Service: Cardiovascular;  Laterality: N/A;  . TOE AMPUTATION Left 08/19/2018   2nd & 3rd toes  . TONSILLECTOMY  ~ 1960  .  TRANSCATHETER AORTIC VALVE REPLACEMENT, TRANSFEMORAL N/A 10/21/2018   Procedure: TRANSCATHETER AORTIC VALVE REPLACEMENT, TRANSFEMORAL;  Surgeon: CSherren Mocha MD;  [facility]  Service: Open Heart Surgery;  Laterality: N/A;  . ULTRASOUND GUIDANCE FOR VASCULAR ACCESS  03/25/2018   Procedure: Ultrasound Guidance For Vascular Access;  Surgeon: JMartinique Peter M, MD;  [facility];  Service: Cardiovascular;;     Current Outpatient Medications  Medication Sig Dispense Refill  . acetaminophen (TYLENOL) 500 MG tablet Take 500-1,000 mg by mouth daily as needed for moderate pain.     .Marland Kitchenamiodarone (PACERONE) 200 MG tablet Take 1 tablet (200 mg total) by mouth daily. 90 tablet 2  . atorvastatin (LIPITOR) 80 MG tablet Take 1 tablet (80 mg total) by mouth daily with supper. 30 tablet 0  . carvedilol (COREG) 3.125 MG tablet Take 1 tablet (3.125 mg total) by mouth 2 (two) times daily. 180 tablet 3  . clopidogrel (PLAVIX) 75 MG tablet Take 1 tablet (75 mg total) by mouth daily with supper. 90 tablet 2  . LORazepam (ATIVAN) 1 MG tablet Take 1 mg by mouth 2 (two) times daily as needed for anxiety.    . methocarbamol (ROBAXIN) 500 MG tablet Take 500 mg by  mouth 2 (two) times daily as needed for mild pain.    . midodrine (PROAMATINE) 10 MG tablet Take 1 tablet (10 mg total) by mouth every Monday, Wednesday, and Friday with hemodialysis. 30 tablet 0  . multivitamin (RENA-VIT) TABS tablet Take 1 tablet by mouth at bedtime. 30 tablet 0  . mupirocin ointment (BACTROBAN) 2 % Apply 1 application topically daily as needed (irritation).     [name] oxyCODONE (OXY IR/ROXICODONE) 5 MG immediate release tablet Take 1-2 tablets (5-10 mg total) by mouth every 4 (four) hours as needed for moderate pain. 30 tablet 0  . sucroferric oxyhydroxide (VELPHORO) 500 MG chewable tablet Chew 2 tablets (1,000 mg total) by mouth 3 (three) times daily with meals. 90 tablet 0  . vitamin B-12 (CYANOCOBALAMIN) 1000 MCG tablet Take  1,000 mcg by mouth every evening.      No current facility-administered medications for this visit.    Allergies:   Patient has no known allergies.    Social History:  The patient  reports that he quit smoking about 8 years ago. His smoking use included cigarettes. He has a 80.00 pack-year smoking history. He has never used smokeless tobacco. He reports that he does not drink alcohol or use drugs.   Family History:  The patient's family history includes Diabetes in his father, mother, and sister; Heart disease in his mother; Hypertension in his father and mother; Skin cancer in his mother.    ROS:  Please see the history of present illness.   Otherwise, review of systems are positive for none.   All other systems are reviewed and negative.    PHYSICAL EXAM: VS:  BP 116/62   Pulse 80   Temp (!) 97.1 F (36.2 C)   Ht '5\' 10"'$  (1.778 m)   Wt 174 lb 12.8 oz (79.3 kg)   SpO2 99%   BMI 25.08 kg/m  , BMI Body mass index is 25.08 kg/m. GEN: Well nourished, well developed, in no acute distress  HEENT: normal  Neck: no JVD, or masses. Bilateral carotid bruits Cardiac: RRR; no rubs, or gallops,no edema . 1 out of 6 systolic murmur at the aortic area Respiratory:  clear to auscultation bilaterally, normal work of breathing GI: soft, nontender, nondistended, + BS MS: no deformity or atrophy  Skin: warm and dry, no rash Neuro:  Strength and sensation are intact Psych: euthymic mood, full affect Vascular: Femoral pulses slightly diminished bilaterally. No left groin hematoma or seroma. Posterior tibial pulses palpable on the left side.   EKG:  EKG is not ordered today.    Recent Labs: 10/16/2018: ALT 13 10/17/2018: B Natriuretic Peptide 1,967.8 10/18/2018: TSH 1.662 10/23/2018: Magnesium 2.2 10/27/2018: BUN 54; Creatinine, Ser 9.21; Potassium 4.6; Sodium 137 11/12/2018: Hemoglobin 11.4; Platelets 51    Lipid Panel    Component Value Date/Time   CHOL 117 02/12/2018 0823   TRIG 174 (H)  02/12/2018 0823   HDL 32 (L) 02/12/2018 0823   CHOLHDL 3.7 02/12/2018 0823   CHOLHDL 7.4 02/25/2014 0959   VLDL 40 02/25/2014 0959   LDLCALC 50 02/12/2018 0823      Wt Readings from Last 3 Encounters:  03/31/19 174 lb 12.8 oz (79.3 kg)  02/26/19 175 lb (79.4 kg)  11/12/18 176 lb 1.9 oz (79.9 kg)       No flowsheet data found.    ASSESSMENT AND PLAN:  1. Peripheral arterial disease with previous critical limb ischemia manifested by gangrenous toes on the left side.  He is  status post femoral-popliteal bypass and resection of affected toes with subsequent healing. He has a palpable posterior tibial pulse. He does have significant disease on the right side but does not have lifestyle limiting symptoms and there is no evidence of critical limb ischemia. Recommend continuing medical therapy. I requested a follow-up ABI and left lower extremity arterial duplex.  2. Coronary artery disease involving native coronary arteries: He is being treated medically with no significant angina.  3. Chronic systolic heart failure: Seems to be stable.  He is not able to tolerate heart failure medications due to hypotension during dialysis he is on small dose carvedilol.  4. End-stage renal disease on hemodialysis Monday Wednesday Friday.  5. Hyperlipidemia: Continue high-dose atorvastatin.  6. Severe aortic stenosis: Status post TAVR in August 2020    Disposition:   FU with me in 6 months  Signed,  Kathlyn Sacramento, MD  03/31/2019 9:17 AM    [place]

## 2019-04-02 ENCOUNTER — Telehealth (HOSPITAL_COMMUNITY): Payer: Self-pay

## 2019-04-02 NOTE — Telephone Encounter (Signed)

## 2019-04-03 ENCOUNTER — Ambulatory Visit: Payer: Medicare Other

## 2019-04-08 ENCOUNTER — Other Ambulatory Visit: Payer: Self-pay

## 2019-04-08 ENCOUNTER — Other Ambulatory Visit (HOSPITAL_COMMUNITY)
Admission: RE | Admit: 2019-04-08 | Discharge: 2019-04-08 | Disposition: A | Discharge status: Home / Self Care | Payer: Medicare Other | Source: Ambulatory Visit | Attending: Vascular Surgery | Admitting: Vascular Surgery

## 2019-04-08 ENCOUNTER — Ambulatory Visit: Payer: Medicare Other | Admitting: Physician Assistant

## 2019-04-08 ENCOUNTER — Ambulatory Visit: Payer: Medicare Other

## 2019-04-08 VITALS — BP 125/51 | HR 79 | Temp 97.8°F | Resp 18 | Ht 70.0 in | Wt 178.0 lb

## 2019-04-08 DIAGNOSIS — N186 End stage renal disease: Secondary | ICD-10-CM

## 2019-04-08 DIAGNOSIS — I739 Peripheral vascular disease, unspecified: Secondary | ICD-10-CM | POA: Diagnosis not present

## 2019-04-08 DIAGNOSIS — Z20822 Contact with and (suspected) exposure to covid-19: Secondary | ICD-10-CM | POA: Insufficient documentation

## 2019-04-08 DIAGNOSIS — Z992 Dependence on renal dialysis: Secondary | ICD-10-CM

## 2019-04-08 DIAGNOSIS — Z01812 Encounter for preprocedural laboratory examination: Secondary | ICD-10-CM | POA: Insufficient documentation

## 2019-04-08 LAB — SARS CORONAVIRUS 2 (TAT 6-24 HRS): SARS Coronavirus 2: NEGATIVE

## 2019-04-08 NOTE — H&P (View-Only) (Signed)
Established Critical Limb Ischemia Patient   History of Present Illness   Jose Garrett is a 69 y.o. (Jul 26, 1950) male who presents with wrist pain and tissue changes of right foot.  Patient states the symptoms started this past Saturday evening and have been progressing.  The pain in his foot is now keeping him up at night and patient does not know how much longer he can take it.  Surgical history significant for left femoral to popliteal bypass with PTFE and second and third toe irritations by Dr. Donzetta Matters in June 2020.  Patient is known to also have a flush occlusion of right SFA.  Plan was originally to have patient fully recover from left lower extremity bypass surgery and discuss proceeding with bypass surgery on the right leg.  He had been tolerating medical management until this past weekend when symptoms began.  Past medical history also significant for end-stage renal disease on hemodialysis Monday Wednesday Friday.  Patient has also followed for PAD by Dr. Fletcher Anon.  Patient states he did not have any tissue changes or rest pain when he saw Dr. Fletcher Anon last week.  The patient's PMH, PSH, SH, and FamHx were reviewed and are unchanged from prior visit.  Current Outpatient Medications  Medication Sig Dispense Refill  . acetaminophen (TYLENOL) 500 MG tablet Take 500-1,000 mg by mouth daily as needed for moderate pain.     [name] amiodarone (PACERONE) 200 MG tablet Take 1 tablet (200 mg total) by mouth daily. 90 tablet 2  . atorvastatin (LIPITOR) 80 MG tablet Take 1 tablet (80 mg total) by mouth daily with supper. 30 tablet 0  . carvedilol (COREG) 3.125 MG tablet Take 1 tablet (3.125 mg total) by mouth 2 (two) times daily. 180 tablet 3  . clopidogrel (PLAVIX) 75 MG tablet Take 1 tablet (75 mg total) by mouth daily with supper. 90 tablet 2  . LORazepam (ATIVAN) 1 MG tablet Take 1 mg by mouth 2 (two) times daily as needed for anxiety.    . methocarbamol (ROBAXIN) 500 MG tablet Take 500 mg by mouth 2  (two) times daily as needed for mild pain.    . midodrine (PROAMATINE) 10 MG tablet Take 1 tablet (10 mg total) by mouth every Monday, Wednesday, and Friday with hemodialysis. 30 tablet 0  . multivitamin (RENA-VIT) TABS tablet Take 1 tablet by mouth at bedtime. 30 tablet 0  . mupirocin ointment (BACTROBAN) 2 % Apply 1 application topically daily as needed (irritation).     [name] oxyCODONE (OXY IR/ROXICODONE) 5 MG immediate release tablet Take 1-2 tablets (5-10 mg total) by mouth every 4 (four) hours as needed for moderate pain. 30 tablet 0  . sucroferric oxyhydroxide (VELPHORO) 500 MG chewable tablet Chew 2 tablets (1,000 mg total) by mouth 3 (three) times daily with meals. 90 tablet 0  . vitamin B-12 (CYANOCOBALAMIN) 1000 MCG tablet Take 1,000 mcg by mouth every evening.      No current facility-administered medications for this visit.    On ROS today: 10 system ROS negative unless otherwise noted in HPI   Physical Examination   Vitals:   04/08/19 1036  BP: (!) 125/51  Pulse: 79  Resp: 18  Temp: 97.8 F (36.6 C)  TempSrc: Temporal  SpO2: 98%  Weight: 178 lb (80.7 kg)  Height: 5\' 10"  (1.778 m)   Body mass index is 25.54 kg/m.  General Alert, O x 3, WD, NAD  Pulmonary Sym exp, good B air movt, CTA B  Cardiac  RRR, Nl S1, S2, no Murmurs, No rubs, No S3,S4  Vascular Vessel Right Left  Radial Palpable Palpable  Femoral Palpable Palpable with residual fluid collection  Popliteal Not palpable Not palpable  PT Not palpable Faintly palpable  DP Not palpable Not palpable    Gastro- intestinal soft, non-distended, non-tender to palpation,   Musculo- skeletal M/S 5/5 throughout  , Right toes with dusky appearance with erythema extending onto the dorsal foot; right foot also edematous; monophasic right DP and PT by Doppler; foot warm to touch  Neurologic Cranial nerves 2-12 intact , Pain and light touch intact in extremities , Motor exam as listed above     Medical Decision Making    Jose Garrett is a 69 y.o. male who presents with critical limb ischemia RLE with rest pain and tissue changes.   Patent bypass left lower extremity with palpable PT pulse  New rest pain and tissue changes of toes of right foot and forefoot  Patient has a known occlusion of right SFA and original plan was to consider bypass revascularization of RLE after patient had recovered from left lower extremity bypass surgery  I explained to the patient that both Dr. Fletcher Anon and Dr. Donzetta Matters are able to perform angiography.  Given the severity of symptoms, patient does not want to wait any longer than he has to.  He is agreeable to proceed with aortogram with right lower extremity runoff and possible intervention with Dr. Donzetta Matters or Dr. Carlis Abbott tomorrow in preparation for possible bypass surgery in the near future.  Continue plavix and aspirin    Dagoberto Ligas PA-C [facility] Office: [phone]  Clinic MD: Scot Dock

## 2019-04-08 NOTE — Progress Notes (Signed)
Established Critical Limb Ischemia Patient   History of Present Illness   Jose Garrett is a 69 y.o. (1951-01-05) male who presents with wrist pain and tissue changes of right foot.  Patient states the symptoms started this past Saturday evening and have been progressing.  The pain in his foot is now keeping him up at night and patient does not know how much longer he can take it.  Surgical history significant for left femoral to popliteal bypass with PTFE and second and third toe irritations by Dr. Donzetta Matters in June 2020.  Patient is known to also have a flush occlusion of right SFA.  Plan was originally to have patient fully recover from left lower extremity bypass surgery and discuss proceeding with bypass surgery on the right leg.  He had been tolerating medical management until this past weekend when symptoms began.  Past medical history also significant for end-stage renal disease on hemodialysis Monday Wednesday Friday.  Patient has also followed for PAD by Dr. Fletcher Anon.  Patient states he did not have any tissue changes or rest pain when he saw Dr. Fletcher Anon last week.  The patient's PMH, PSH, SH, and FamHx were reviewed and are unchanged from prior visit.  Current Outpatient Medications  Medication Sig Dispense Refill  . acetaminophen (TYLENOL) 500 MG tablet Take 500-1,000 mg by mouth daily as needed for moderate pain.     [name] amiodarone (PACERONE) 200 MG tablet Take 1 tablet (200 mg total) by mouth daily. 90 tablet 2  . atorvastatin (LIPITOR) 80 MG tablet Take 1 tablet (80 mg total) by mouth daily with supper. 30 tablet 0  . carvedilol (COREG) 3.125 MG tablet Take 1 tablet (3.125 mg total) by mouth 2 (two) times daily. 180 tablet 3  . clopidogrel (PLAVIX) 75 MG tablet Take 1 tablet (75 mg total) by mouth daily with supper. 90 tablet 2  . LORazepam (ATIVAN) 1 MG tablet Take 1 mg by mouth 2 (two) times daily as needed for anxiety.    . methocarbamol (ROBAXIN) 500 MG tablet Take 500 mg by mouth 2  (two) times daily as needed for mild pain.    . midodrine (PROAMATINE) 10 MG tablet Take 1 tablet (10 mg total) by mouth every Monday, Wednesday, and Friday with hemodialysis. 30 tablet 0  . multivitamin (RENA-VIT) TABS tablet Take 1 tablet by mouth at bedtime. 30 tablet 0  . mupirocin ointment (BACTROBAN) 2 % Apply 1 application topically daily as needed (irritation).     [name] oxyCODONE (OXY IR/ROXICODONE) 5 MG immediate release tablet Take 1-2 tablets (5-10 mg total) by mouth every 4 (four) hours as needed for moderate pain. 30 tablet 0  . sucroferric oxyhydroxide (VELPHORO) 500 MG chewable tablet Chew 2 tablets (1,000 mg total) by mouth 3 (three) times daily with meals. 90 tablet 0  . vitamin B-12 (CYANOCOBALAMIN) 1000 MCG tablet Take 1,000 mcg by mouth every evening.      No current facility-administered medications for this visit.    On ROS today: 10 system ROS negative unless otherwise noted in HPI   Physical Examination   Vitals:   04/08/19 1036  BP: (!) 125/51  Pulse: 79  Resp: 18  Temp: 97.8 F (36.6 C)  TempSrc: Temporal  SpO2: 98%  Weight: 178 lb (80.7 kg)  Height: 5\' 10"  (1.778 m)   Body mass index is 25.54 kg/m.  General Alert, O x 3, WD, NAD  Pulmonary Sym exp, good B air movt, CTA B  Cardiac  RRR, Nl S1, S2, no Murmurs, No rubs, No S3,S4  Vascular Vessel Right Left  Radial Palpable Palpable  Femoral Palpable Palpable with residual fluid collection  Popliteal Not palpable Not palpable  PT Not palpable Faintly palpable  DP Not palpable Not palpable    Gastro- intestinal soft, non-distended, non-tender to palpation,   Musculo- skeletal M/S 5/5 throughout  , Right toes with dusky appearance with erythema extending onto the dorsal foot; right foot also edematous; monophasic right DP and PT by Doppler; foot warm to touch  Neurologic Cranial nerves 2-12 intact , Pain and light touch intact in extremities , Motor exam as listed above     Medical Decision Making    Jose Garrett is a 69 y.o. male who presents with critical limb ischemia RLE with rest pain and tissue changes.   Patent bypass left lower extremity with palpable PT pulse  New rest pain and tissue changes of toes of right foot and forefoot  Patient has a known occlusion of right SFA and original plan was to consider bypass revascularization of RLE after patient had recovered from left lower extremity bypass surgery  I explained to the patient that both Dr. Fletcher Anon and Dr. Donzetta Matters are able to perform angiography.  Given the severity of symptoms, patient does not want to wait any longer than he has to.  He is agreeable to proceed with aortogram with right lower extremity runoff and possible intervention with Dr. Donzetta Matters or Dr. Carlis Abbott tomorrow in preparation for possible bypass surgery in the near future.  Continue plavix and aspirin    Dagoberto Ligas PA-C [facility] [place] Office: [phone]  Clinic MD: Scot Dock

## 2019-04-09 ENCOUNTER — Ambulatory Visit (HOSPITAL_COMMUNITY)
Admission: RE | Admit: 2019-04-09 | Payer: Medicare Other | Source: Ambulatory Visit | Attending: Cardiovascular Disease | Admitting: Cardiovascular Disease

## 2019-04-09 ENCOUNTER — Other Ambulatory Visit: Payer: Self-pay

## 2019-04-09 ENCOUNTER — Inpatient Hospital Stay (HOSPITAL_COMMUNITY)
Admission: RE | Admit: 2019-04-09 | Discharge: 2019-04-16 | DRG: 252 | Disposition: A | Discharge status: Home Health | Payer: Medicare Other | Attending: Vascular Surgery | Admitting: Vascular Surgery

## 2019-04-09 ENCOUNTER — Inpatient Hospital Stay (HOSPITAL_COMMUNITY): Admission: RE | Admit: 2019-04-09 | Payer: Medicare Other | Source: Ambulatory Visit

## 2019-04-09 ENCOUNTER — Encounter (HOSPITAL_COMMUNITY)
Admission: RE | Disposition: A | Discharge status: Home Health | Payer: Self-pay | Source: Home / Self Care | Attending: Vascular Surgery

## 2019-04-09 DIAGNOSIS — N186 End stage renal disease: Secondary | ICD-10-CM | POA: Diagnosis not present

## 2019-04-09 DIAGNOSIS — I132 Hypertensive heart and chronic kidney disease with heart failure and with stage 5 chronic kidney disease, or end stage renal disease: Secondary | ICD-10-CM | POA: Diagnosis present

## 2019-04-09 DIAGNOSIS — I70221 Atherosclerosis of native arteries of extremities with rest pain, right leg: Principal | ICD-10-CM | POA: Diagnosis present

## 2019-04-09 DIAGNOSIS — I255 Ischemic cardiomyopathy: Secondary | ICD-10-CM | POA: Diagnosis present

## 2019-04-09 DIAGNOSIS — Z87891 Personal history of nicotine dependence: Secondary | ICD-10-CM

## 2019-04-09 DIAGNOSIS — Z20822 Contact with and (suspected) exposure to covid-19: Secondary | ICD-10-CM | POA: Diagnosis present

## 2019-04-09 DIAGNOSIS — Z992 Dependence on renal dialysis: Secondary | ICD-10-CM

## 2019-04-09 DIAGNOSIS — E785 Hyperlipidemia, unspecified: Secondary | ICD-10-CM | POA: Diagnosis present

## 2019-04-09 DIAGNOSIS — I2510 Atherosclerotic heart disease of native coronary artery without angina pectoris: Secondary | ICD-10-CM | POA: Diagnosis present

## 2019-04-09 DIAGNOSIS — I5042 Chronic combined systolic (congestive) and diastolic (congestive) heart failure: Secondary | ICD-10-CM | POA: Diagnosis present

## 2019-04-09 DIAGNOSIS — I252 Old myocardial infarction: Secondary | ICD-10-CM

## 2019-04-09 DIAGNOSIS — F419 Anxiety disorder, unspecified: Secondary | ICD-10-CM | POA: Diagnosis present

## 2019-04-09 DIAGNOSIS — Z7902 Long term (current) use of antithrombotics/antiplatelets: Secondary | ICD-10-CM

## 2019-04-09 DIAGNOSIS — I472 Ventricular tachycardia: Secondary | ICD-10-CM | POA: Diagnosis present

## 2019-04-09 DIAGNOSIS — K219 Gastro-esophageal reflux disease without esophagitis: Secondary | ICD-10-CM | POA: Diagnosis present

## 2019-04-09 DIAGNOSIS — I739 Peripheral vascular disease, unspecified: Secondary | ICD-10-CM | POA: Diagnosis not present

## 2019-04-09 DIAGNOSIS — I129 Hypertensive chronic kidney disease with stage 1 through stage 4 chronic kidney disease, or unspecified chronic kidney disease: Secondary | ICD-10-CM | POA: Diagnosis not present

## 2019-04-09 DIAGNOSIS — N2581 Secondary hyperparathyroidism of renal origin: Secondary | ICD-10-CM | POA: Diagnosis present

## 2019-04-09 DIAGNOSIS — Z79899 Other long term (current) drug therapy: Secondary | ICD-10-CM

## 2019-04-09 DIAGNOSIS — D696 Thrombocytopenia, unspecified: Secondary | ICD-10-CM | POA: Diagnosis present

## 2019-04-09 DIAGNOSIS — Z953 Presence of xenogenic heart valve: Secondary | ICD-10-CM

## 2019-04-09 DIAGNOSIS — D631 Anemia in chronic kidney disease: Secondary | ICD-10-CM | POA: Diagnosis present

## 2019-04-09 DIAGNOSIS — I998 Other disorder of circulatory system: Secondary | ICD-10-CM | POA: Diagnosis not present

## 2019-04-09 DIAGNOSIS — Z8249 Family history of ischemic heart disease and other diseases of the circulatory system: Secondary | ICD-10-CM | POA: Diagnosis not present

## 2019-04-09 DIAGNOSIS — E8581 Light chain (AL) amyloidosis: Secondary | ICD-10-CM | POA: Diagnosis present

## 2019-04-09 DIAGNOSIS — E8770 Fluid overload, unspecified: Secondary | ICD-10-CM | POA: Diagnosis not present

## 2019-04-09 DIAGNOSIS — I120 Hypertensive chronic kidney disease with stage 5 chronic kidney disease or end stage renal disease: Secondary | ICD-10-CM | POA: Diagnosis not present

## 2019-04-09 HISTORY — PX: ABDOMINAL AORTOGRAM W/LOWER EXTREMITY: CATH118223

## 2019-04-09 LAB — CBC
HCT: 35 % — ABNORMAL LOW (ref 39.0–52.0)
Hemoglobin: 11.3 g/dL — ABNORMAL LOW (ref 13.0–17.0)
MCH: 34.5 pg — ABNORMAL HIGH (ref 26.0–34.0)
MCHC: 32.3 g/dL (ref 30.0–36.0)
MCV: 106.7 fL — ABNORMAL HIGH (ref 80.0–100.0)
Platelets: 102 10*3/uL — ABNORMAL LOW (ref 150–400)
RBC: 3.28 MIL/uL — ABNORMAL LOW (ref 4.22–5.81)
RDW: 14.6 % (ref 11.5–15.5)
WBC: 11.3 10*3/uL — ABNORMAL HIGH (ref 4.0–10.5)
nRBC: 0 % (ref 0.0–0.2)

## 2019-04-09 LAB — POCT I-STAT, CHEM 8
BUN: 36 mg/dL — ABNORMAL HIGH (ref 8–23)
Calcium, Ion: 1.02 mmol/L — ABNORMAL LOW (ref 1.15–1.40)
Chloride: 93 mmol/L — ABNORMAL LOW (ref 98–111)
Creatinine, Ser: 7 mg/dL — ABNORMAL HIGH (ref 0.61–1.24)
Glucose, Bld: 96 mg/dL (ref 70–99)
HCT: 35 % — ABNORMAL LOW (ref 39.0–52.0)
Hemoglobin: 11.9 g/dL — ABNORMAL LOW (ref 13.0–17.0)
Potassium: 4.1 mmol/L (ref 3.5–5.1)
Sodium: 137 mmol/L (ref 135–145)
TCO2: 35 mmol/L — ABNORMAL HIGH (ref 22–32)

## 2019-04-09 LAB — CREATININE, SERUM
Creatinine, Ser: 4.69 mg/dL — ABNORMAL HIGH (ref 0.61–1.24)
GFR calc Af Amer: 14 mL/min — ABNORMAL LOW (ref >60)
GFR calc non Af Amer: 12 mL/min — ABNORMAL LOW (ref >60)

## 2019-04-09 LAB — MRSA PCR SCREENING: MRSA by PCR: NEGATIVE

## 2019-04-09 SURGERY — ABDOMINAL AORTOGRAM W/LOWER EXTREMITY
Anesthesia: LOCAL

## 2019-04-09 MED ORDER — METHOCARBAMOL 500 MG PO TABS: 500.0000 mg | ORAL_TABLET | Freq: Three times a day (TID) | ORAL | Status: DC | PRN

## 2019-04-09 MED ORDER — OXYCODONE HCL 5 MG PO TABS: 5.0000 mg | ORAL_TABLET | ORAL | Status: DC | PRN

## 2019-04-09 MED ORDER — HYDRALAZINE HCL 20 MG/ML IJ SOLN: 5.0000 mg | INTRAMUSCULAR | Status: DC | PRN

## 2019-04-09 MED ORDER — MUPIROCIN 2 % EX OINT: 1.0000 "application " | TOPICAL_OINTMENT | Freq: Every day | CUTANEOUS | Status: DC | PRN

## 2019-04-09 MED ORDER — IODIXANOL 320 MG/ML IV SOLN
INTRAVENOUS | Status: DC | PRN
  Administered 2019-04-09: 117 mL via INTRA_ARTERIAL

## 2019-04-09 MED ORDER — AMIODARONE HCL 200 MG PO TABS
200.0000 mg | ORAL_TABLET | Freq: Every day | ORAL | Status: DC
  Administered 2019-04-09 – 2019-04-16 (×8): 200 mg via ORAL
  Filled 2019-04-09 (×8): qty 1

## 2019-04-09 MED ORDER — LORAZEPAM 1 MG PO TABS: 1.0000 mg | ORAL_TABLET | Freq: Two times a day (BID) | ORAL | Status: DC | PRN

## 2019-04-09 MED ORDER — CHLORHEXIDINE GLUCONATE CLOTH 2 % EX PADS
6.0000 | MEDICATED_PAD | Freq: Every day | CUTANEOUS | Status: DC
  Administered 2019-04-09 – 2019-04-16 (×7): 6 via TOPICAL

## 2019-04-09 MED ORDER — HEPARIN (PORCINE) IN NACL 1000-0.9 UT/500ML-% IV SOLN
INTRAVENOUS | Status: AC
  Filled 2019-04-09: qty 500

## 2019-04-09 MED ORDER — ATORVASTATIN CALCIUM 80 MG PO TABS
80.0000 mg | ORAL_TABLET | Freq: Every day | ORAL | Status: DC
  Administered 2019-04-09 – 2019-04-15 (×7): 80 mg via ORAL
  Filled 2019-04-09 (×7): qty 1

## 2019-04-09 MED ORDER — LABETALOL HCL 5 MG/ML IV SOLN: 10.0000 mg | INTRAVENOUS | Status: DC | PRN

## 2019-04-09 MED ORDER — HEPARIN SODIUM (PORCINE) 5000 UNIT/ML IJ SOLN
5000.0000 [IU] | Freq: Three times a day (TID) | INTRAMUSCULAR | Status: DC
  Administered 2019-04-09 – 2019-04-10 (×2): 5000 [IU] via SUBCUTANEOUS
  Filled 2019-04-09: qty 1

## 2019-04-09 MED ORDER — OXYCODONE HCL 5 MG PO TABS
ORAL_TABLET | ORAL | Status: AC
  Filled 2019-04-09: qty 2

## 2019-04-09 MED ORDER — VITAMIN B-12 1000 MCG PO TABS
1000.0000 ug | ORAL_TABLET | Freq: Every evening | ORAL | Status: DC
  Administered 2019-04-09 – 2019-04-15 (×7): 1000 ug via ORAL
  Filled 2019-04-09 (×7): qty 1

## 2019-04-09 MED ORDER — SODIUM CHLORIDE 0.9% FLUSH
3.0000 mL | Freq: Two times a day (BID) | INTRAVENOUS | Status: DC
  Administered 2019-04-09 (×2): 3 mL via INTRAVENOUS

## 2019-04-09 MED ORDER — MORPHINE SULFATE (PF) 2 MG/ML IV SOLN
INTRAVENOUS | Status: AC
  Filled 2019-04-09: qty 1

## 2019-04-09 MED ORDER — FENTANYL CITRATE (PF) 100 MCG/2ML IJ SOLN
INTRAMUSCULAR | Status: AC
  Filled 2019-04-09: qty 2

## 2019-04-09 MED ORDER — FENTANYL CITRATE (PF) 100 MCG/2ML IJ SOLN
INTRAMUSCULAR | Status: DC | PRN
  Administered 2019-04-09: 50 ug via INTRAVENOUS

## 2019-04-09 MED ORDER — CARVEDILOL 3.125 MG PO TABS
3.1250 mg | ORAL_TABLET | Freq: Two times a day (BID) | ORAL | Status: DC
  Administered 2019-04-09 – 2019-04-15 (×14): 3.125 mg via ORAL
  Filled 2019-04-09 (×14): qty 1

## 2019-04-09 MED ORDER — RENA-VITE PO TABS
1.0000 | ORAL_TABLET | Freq: Every day | ORAL | Status: DC
  Administered 2019-04-09 – 2019-04-15 (×7): 1 via ORAL
  Filled 2019-04-09 (×7): qty 1

## 2019-04-09 MED ORDER — MORPHINE SULFATE (PF) 2 MG/ML IV SOLN
2.0000 mg | INTRAVENOUS | Status: DC | PRN
  Administered 2019-04-09: 2 mg via INTRAVENOUS

## 2019-04-09 MED ORDER — MIDODRINE HCL 5 MG PO TABS
10.0000 mg | ORAL_TABLET | ORAL | Status: DC
  Administered 2019-04-13: 10 mg via ORAL
  Filled 2019-04-09 (×3): qty 2

## 2019-04-09 MED ORDER — OXYCODONE HCL 5 MG PO TABS
5.0000 mg | ORAL_TABLET | ORAL | Status: DC | PRN
  Administered 2019-04-09 – 2019-04-16 (×17): 10 mg via ORAL
  Filled 2019-04-09 (×17): qty 2

## 2019-04-09 MED ORDER — MIDAZOLAM HCL 2 MG/2ML IJ SOLN
INTRAMUSCULAR | Status: DC | PRN
  Administered 2019-04-09: 1 mg via INTRAVENOUS

## 2019-04-09 MED ORDER — ACETAMINOPHEN 500 MG PO TABS: 500.0000 mg | ORAL_TABLET | Freq: Every day | ORAL | Status: DC | PRN

## 2019-04-09 MED ORDER — LIDOCAINE HCL (PF) 1 % IJ SOLN
INTRAMUSCULAR | Status: AC
  Filled 2019-04-09: qty 30

## 2019-04-09 MED ORDER — CLOPIDOGREL BISULFATE 75 MG PO TABS
75.0000 mg | ORAL_TABLET | Freq: Every day | ORAL | Status: DC
  Administered 2019-04-09 – 2019-04-15 (×7): 75 mg via ORAL
  Filled 2019-04-09 (×7): qty 1

## 2019-04-09 MED ORDER — LIDOCAINE HCL (PF) 1 % IJ SOLN
INTRAMUSCULAR | Status: DC | PRN
  Administered 2019-04-09: 15 mL via INTRADERMAL

## 2019-04-09 MED ORDER — MIDAZOLAM HCL 2 MG/2ML IJ SOLN
INTRAMUSCULAR | Status: AC
  Filled 2019-04-09: qty 2

## 2019-04-09 MED ORDER — ONDANSETRON HCL 4 MG/2ML IJ SOLN
4.0000 mg | Freq: Four times a day (QID) | INTRAMUSCULAR | Status: DC | PRN
  Administered 2019-04-10 – 2019-04-13 (×2): 4 mg via INTRAVENOUS
  Filled 2019-04-09: qty 2

## 2019-04-09 MED ORDER — SODIUM CHLORIDE 0.9 % IV SOLN: 250.0000 mL | INTRAVENOUS | Status: DC | PRN

## 2019-04-09 MED ORDER — SUCROFERRIC OXYHYDROXIDE 500 MG PO CHEW: 1000.0000 mg | CHEWABLE_TABLET | Freq: Three times a day (TID) | ORAL | Status: DC

## 2019-04-09 MED ORDER — SODIUM CHLORIDE 0.9% FLUSH: 3.0000 mL | INTRAVENOUS | Status: DC | PRN

## 2019-04-09 MED ORDER — CALCITRIOL 0.25 MCG PO CAPS
1.5000 ug | ORAL_CAPSULE | ORAL | Status: DC
  Administered 2019-04-13 – 2019-04-15 (×2): 1.5 ug via ORAL
  Filled 2019-04-09: qty 3

## 2019-04-09 MED ORDER — ACETAMINOPHEN 325 MG PO TABS: 650.0000 mg | ORAL_TABLET | ORAL | Status: DC | PRN

## 2019-04-09 MED ORDER — HEPARIN (PORCINE) IN NACL 1000-0.9 UT/500ML-% IV SOLN
INTRAVENOUS | Status: DC | PRN
  Administered 2019-04-09 (×2): 500 mL

## 2019-04-09 MED ORDER — CALCITRIOL 0.25 MCG PO CAPS
1.5000 ug | ORAL_CAPSULE | Freq: Once | ORAL | Status: AC
  Filled 2019-04-09: qty 3

## 2019-04-09 MED ORDER — FERRIC CITRATE 1 GM 210 MG(FE) PO TABS
630.0000 mg | ORAL_TABLET | Freq: Three times a day (TID) | ORAL | Status: DC
  Administered 2019-04-10 – 2019-04-12 (×4): 630 mg via ORAL
  Administered 2019-04-12: 420 mg via ORAL
  Administered 2019-04-12 – 2019-04-16 (×4): 630 mg via ORAL
  Filled 2019-04-09 (×21): qty 3

## 2019-04-09 MED ORDER — SODIUM CHLORIDE 0.9 % IV SOLN: INTRAVENOUS | Status: DC

## 2019-04-09 SURGICAL SUPPLY — 10 items
CATH OMNI FLUSH 5F 65CM (CATHETERS) ×1 IMPLANT
CATH STRAIGHT 5FR 65CM (CATHETERS) ×1 IMPLANT
KIT MICROPUNCTURE NIT STIFF (SHEATH) ×1 IMPLANT
KIT PV (KITS) ×2 IMPLANT
SHEATH PINNACLE 5F 10CM (SHEATH) ×1 IMPLANT
SHEATH PROBE COVER 6X72 (BAG) ×1 IMPLANT
SYR MEDRAD MARK V 150ML (SYRINGE) ×1 IMPLANT
TRANSDUCER W/STOPCOCK (MISCELLANEOUS) ×2 IMPLANT
TRAY PV CATH (CUSTOM PROCEDURE TRAY) ×2 IMPLANT
WIRE BENTSON .035X145CM (WIRE) ×1 IMPLANT

## 2019-04-09 NOTE — Op Note (Signed)
    Patient name: Jose Garrett MRN: [id number] DOB: [birth year] Sex: male  04/09/2019 Pre-operative Diagnosis: Critical right lower extremity ischemia with rest pain Post-operative diagnosis:  Same Surgeon:  Eda Paschal. Donzetta Matters, MD Procedure Performed: 1.  Ultrasound-guided cannulation left common femoral artery 2.  Aortogram with bilateral lower extremity runoff 3.  Moderate sedation with fentanyl and Versed for 23 minutes 4.  Pullback pressure gradient right common iliac artery   Indications: 69 year old male with history of left common femoral to below-knee popliteal artery bypass with graft.  He now has rest pain in his right lower extremity.  He is indicated for angiography with possible invention of the right lower extremity.  Findings: The aorta is free of flow-limiting stenosis the renal arteries do not appear to fill and he is a known dialysis patient.  Right common iliac artery has significant calcification.  Pullback gradient there is only 10 mmHg systolic and this was not treated.  Right SFA is occluded at the takeoff.  He reconstitutes above the knee popliteal artery and this is diseased.  Below the knee appears to be a healthier popliteal artery with runoff via peroneal and posterior tibial artery.  On the left side his bypass is patent filling the below-knee popliteal artery and runoff is via the posterior tibial and peroneal arteries to the foot.  He will be scheduled for right common femoral to below-knee popliteal artery bypass.   Procedure:  The patient was identified in the holding area and taken to room 8.  The patient was then placed supine on the table and prepped and draped in the usual sterile fashion.  A time out was called.  Ultrasound was used to evaluate the right common femoral artery this was somewhat diseased the SFA was noted to be occluded.  Moderate sedation was administered his vital signs were monitored throughout the case.  Ultrasound was used to cannulate the  common femoral artery where it was not diseased on the anterior wall.  And images saved department record.  Micropuncture wire followed by sheath was placed.  Bentson wire was placed to the aorta.  There was some difficulty tracking the Bentson wire.  We able to manipulate this into the aorta Omni catheter was placed to level 1 aortogram performed followed bilateral extremity runoff.  We then performed angled views of the right common iliac artery which demonstrated what appeared to be heavy calcification.  Pullback gradient was measured was 10 mmHg systolic.  This was not treated.  Catheter was removed.  Patient will be scheduled for right common femoral to below-knee popliteal artery bypass.  Contrast: 117cc   Lional Icenogle C. Donzetta Matters, MD [facility] Office: [phone] Pager: [phone]

## 2019-04-09 NOTE — Progress Notes (Signed)
Pt transferred to 4E-27 from cath lab via stretcher. Pt walked to bed. Pt given CHG bath. Tele applied,CCMD notified. Pt oriented to room, call bell, and bed. Call bell within reach. VSS. R groin level 0. Menu given to pt to order meal. Will continue to monitor. Amanda Cockayne, RN

## 2019-04-09 NOTE — Plan of Care (Signed)
  Problem: Clinical Measurements: Goal: Cardiovascular complication will be avoided Outcome: Progressing   Problem: Activity: Goal: Risk for activity intolerance will decrease Outcome: Progressing   Problem: Elimination: Goal: Will not experience complications related to bowel motility Outcome: Progressing   Problem: Elimination: Goal: Will not experience complications related to urinary retention Outcome: Not Applicable

## 2019-04-09 NOTE — Consult Note (Addendum)
[name] Renal Consultation Note    Indication for Consultation:  Management of ESRD/hemodialysis; anemia, hypertension/volume and secondary hyperparathyroidism  PXT:GGYI, Cammie, MD  HPI: Jose Garrett is a 69 y.o. male. ESRD 2/2 cardiorenal syndrome in setting of AL amyloidosis on HD MWF at [facility], first starting in 01/2016.  Past medical history significant for HTN, GERD, HLD, RLS, thrombocytopenia, PAD s/p L fem-pop bypass graft in June 2020, severe AS s/p TAVR in 10/2018, and ischemic CM s/p life vest for 4-6wks until amidarone loaded.  Last dialysis on 04/08/19, completed 2hrs 41 min of HD and left 0.7kg over his dry weight.    Patient admitted for angiogram today and is scheduled for R common femoral to below the knee popliteal artery bypass due to critical limb ischemia on R.  Reports worsened pain in RLE.  Dialysis has been difficult lately due to back and foot pain, and he has been signing off early.  He has been feeling more wore out after treatment lately.  States he has been getting close to his dry weight most days.  Denies SOB, CP, n/v/d, weakness, dizziness and fatigue.     Past Medical History:  Diagnosis Date  . Amyloidosis ([place])   . Anemia of chronic disease   . Anxiety   . CAD (coronary artery disease)    a. cardiac cath on 11/25/17 with occlusion of the RCA which could have been his event but the RCA could not be engaged. The RCA filled distally from left to right collaterals. There were no severe stenoses in the left coronary system. Medical therapy recommended, started on plavix.  . Chronic combined systolic and diastolic CHF (congestive heart failure) ([place])    a. EF dropped to 35-40% with grade 2 DD by echo 11/2017.  [name] DJD (degenerative joint disease), lumbar   . ESRD (end stage renal disease) ([place])    ESRD- HEMO MWF- [facility]  . GERD (gastroesophageal reflux disease)   . Hyperlipidemia   . Hypertension   . Neuropathy   . Peripheral vascular  disease ([place])   . Restless legs   . S/P TAVR (transcatheter aortic valve replacement) 10/21/2018   26 mm Edwards Sapien 3 Ultra transcatheter heart valve placed via percutaneous right transfemoral approach   . Severe aortic stenosis    severe   Past Surgical History:  Procedure Laterality Date  . ABDOMINAL AORTOGRAM W/LOWER EXTREMITY Bilateral 07/16/2018   Procedure: ABDOMINAL AORTOGRAM W/LOWER EXTREMITY;  Surgeon: Wellington Hampshire, MD;  Location: [place] [facility];  Service: Cardiovascular;  Laterality: Bilateral;  . AMPUTATION Left 08/19/2018   Procedure: AMPUTATION LEFT TOES SECOND AND THIRD;  Surgeon: Waynetta Sandy, MD;  Location: [place];  Service: Vascular;  Laterality: Left;  . AV FISTULA PLACEMENT Right 12/27/2015   Procedure: Right Arm ARTERIOVENOUS (AV) FISTULA CREATION;  Surgeon: Angelia Mould, MD;  Location: [place];  Service: Vascular;  Laterality: Right;  . CATARACT EXTRACTION W/ INTRAOCULAR LENS  IMPLANT, BILATERAL    . COLONOSCOPY    . ERCP W/ SPHICTEROTOMY    . FEMORAL-POPLITEAL BYPASS GRAFT Left 07/22/2018   Procedure: left FEMORAL TO BELOW KNEE POPLITEAL ARTERY BYPASS GREAFT using 58mm removable ring propaten gore graft;  Surgeon: Waynetta Sandy, MD;  Location: [place];  Service: Vascular;  Laterality: Left;  . INTRAOPERATIVE TRANSTHORACIC ECHOCARDIOGRAM N/A 10/21/2018   Procedure: Intraoperative Transthoracic Echocardiogram;  Surgeon: Sherren Mocha, MD;  Location: [place];  Service: Open Heart Surgery;  Laterality: N/A;  . IR  GENERIC HISTORICAL  01/27/2016   IR FLUORO GUIDE CV LINE RIGHT 01/27/2016 Arne Cleveland, MD [facility]  . IR GENERIC HISTORICAL  01/27/2016   IR US GUIDE VASC ACCESS RIGHT 01/27/2016 Arne Cleveland, MD [facility]  . LAPAROSCOPIC CHOLECYSTECTOMY  03/2011  . LAPAROSCOPIC CHOLECYSTECTOMY    . LEFT HEART CATH AND CORONARY ANGIOGRAPHY N/A 11/25/2017   Procedure: LEFT HEART CATH AND CORONARY ANGIOGRAPHY;  Surgeon: Martinique, Peter  M, MD;  Location: [place] [facility];  Service: Cardiovascular;  Laterality: N/A;  . LEFT HEART CATH AND CORONARY ANGIOGRAPHY N/A 07/24/2018   Procedure: LEFT HEART CATH AND CORONARY ANGIOGRAPHY;  Surgeon: Sherren Mocha, MD;  Location: [place] [facility];  Service: Cardiovascular;  Laterality: N/A;  . RENAL BIOPSY, PERCUTANEOUS Right 09/08/2014  . RIGHT/LEFT HEART CATH AND CORONARY ANGIOGRAPHY N/A 03/25/2018   Procedure: RIGHT/LEFT HEART CATH AND CORONARY ANGIOGRAPHY;  Surgeon: Martinique, Peter M, MD;  Location: [place] [facility];  Service: Cardiovascular;  Laterality: N/A;  . TOE AMPUTATION Left 08/19/2018   2nd & 3rd toes  . TONSILLECTOMY  ~ 1960  . TRANSCATHETER AORTIC VALVE REPLACEMENT, TRANSFEMORAL N/A 10/21/2018   Procedure: TRANSCATHETER AORTIC VALVE REPLACEMENT, TRANSFEMORAL;  Surgeon: Sherren Mocha, MD;  Location: [place];  Service: Open Heart Surgery;  Laterality: N/A;  . ULTRASOUND GUIDANCE FOR VASCULAR ACCESS  03/25/2018   Procedure: Ultrasound Guidance For Vascular Access;  Surgeon: Martinique, Peter M, MD;  Location: [place] [facility];  Service: Cardiovascular;;   Family History  Problem Relation Age of Onset  . Hypertension Mother   . Diabetes Mother   . Heart disease Mother   . Skin cancer Mother   . Hypertension Father   . Diabetes Father   . Diabetes Sister    Social History:  reports that he quit smoking about 8 years ago. His smoking use included cigarettes. He has a 80.00 pack-year smoking history. He has never used smokeless tobacco. He reports that he does not drink alcohol or use drugs. No Known Allergies Prior to Admission medications   Medication Sig Start Date End Date Taking? Authorizing Provider  acetaminophen (TYLENOL) 500 MG tablet Take 500-1,000 mg by mouth daily as needed for moderate pain.    Yes [provider]  amiodarone (PACERONE) 200 MG tablet Take 1 tablet (200 mg total) by mouth daily. 02/13/19  Yes Burnell Blanks, MD  atorvastatin  (LIPITOR) 80 MG tablet Take 1 tablet (80 mg total) by mouth daily with supper. 08/12/18  Yes Angiulli, Lavon Paganini, PA-C  carvedilol (COREG) 3.125 MG tablet Take 1 tablet (3.125 mg total) by mouth 2 (two) times daily. 11/12/18 11/07/19 Yes Eileen Stanford, PA-C  clopidogrel (PLAVIX) 75 MG tablet Take 1 tablet (75 mg total) by mouth daily with supper. 02/13/19  Yes Burnell Blanks, MD  methocarbamol (ROBAXIN) 500 MG tablet Take 500 mg by mouth 2 (two) times daily as needed for mild pain. 02/20/19  Yes [provider]  midodrine (PROAMATINE) 10 MG tablet Take 1 tablet (10 mg total) by mouth every Monday, Wednesday, and Friday with hemodialysis. 08/13/18  Yes Angiulli, Lavon Paganini, PA-C  multivitamin (RENA-VIT) TABS tablet Take 1 tablet by mouth at bedtime. 08/12/18  Yes Angiulli, Lavon Paganini, PA-C  oxyCODONE (OXY IR/ROXICODONE) 5 MG immediate release tablet Take 1-2 tablets (5-10 mg total) by mouth every 4 (four) hours as needed for moderate pain. 08/20/18  Yes Ulyses Amor, PA-C  sucroferric oxyhydroxide (VELPHORO) 500 MG chewable tablet Chew 2 tablets (1,000 mg  total) by mouth 3 (three) times daily with meals. 08/12/18  Yes Angiulli, Lavon Paganini, PA-C  vitamin B-12 (CYANOCOBALAMIN) 1000 MCG tablet Take 1,000 mcg by mouth every evening.    Yes [provider]  LORazepam (ATIVAN) 1 MG tablet Take 1 mg by mouth 2 (two) times daily as needed for anxiety.    [provider]  mupirocin ointment (BACTROBAN) 2 % Apply 1 application topically daily as needed (irritation).     [provider]   Current Facility-Administered Medications  Medication Dose Route Frequency Provider Last Rate Last Admin  . 0.9 %  sodium chloride infusion   Intravenous Continuous Waynetta Sandy, MD 100 mL/hr at 04/09/19 0609 New Bag at 04/09/19 0609  . acetaminophen (TYLENOL) tablet 500-1,000 mg  500-1,000 mg Oral Daily PRN Waynetta Sandy, MD      . acetaminophen (TYLENOL) tablet 650 mg   650 mg Oral Q4H PRN Waynetta Sandy, MD      . amiodarone (PACERONE) tablet 200 mg  200 mg Oral Daily Waynetta Sandy, MD      . atorvastatin (LIPITOR) tablet 80 mg  80 mg Oral Q supper Waynetta Sandy, MD      . Derrill Memo ON 04/10/2019] calcitRIOL (ROCALTROL) capsule 1.5 mcg  1.5 mcg Oral Q M,W,F-HD Ace Bergfeld, Ria Comment, [place]      . calcitRIOL (ROCALTROL) capsule 1.5 mcg  1.5 mcg Oral Once in dialysis Nyzir Dubois, [place]unt, [place]      . carvedilol (COREG) tablet 3.125 mg  3.125 mg Oral BID Waynetta Sandy, MD      . Chlorhexidine Gluconate Cloth 2 % PADS 6 each  6 each Topical Q0600 Chicquita Mendel, [place]      . clopidogrel (PLAVIX) tablet 75 mg  75 mg Oral Q supper Waynetta Sandy, MD      . hydrALAZINE (APRESOLINE) injection 5 mg  5 mg Intravenous Q20 Min PRN Waynetta Sandy, MD      . labetalol (NORMODYNE) injection 10 mg  10 mg Intravenous Q10 min PRN Waynetta Sandy, MD      . LORazepam (ATIVAN) tablet 1 mg  1 mg Oral BID PRN Waynetta Sandy, MD      . methocarbamol (ROBAXIN) tablet 500 mg  500 mg Oral Q8H PRN Waynetta Sandy, MD      . Derrill Memo ON 04/10/2019] midodrine (PROAMATINE) tablet 10 mg  10 mg Oral Q M,W,F-HD Waynetta Sandy, MD      . morphine 2 MG/ML injection 2 mg  2 mg Intravenous Q1H PRN Waynetta Sandy, MD   2 mg at 04/09/19 1117  . multivitamin (RENA-VIT) tablet 1 tablet  1 tablet Oral QHS Waynetta Sandy, MD      . mupirocin ointment (BACTROBAN) 2 % 1 application  1 application Topical Daily PRN Waynetta Sandy, MD      . ondansetron [facility]) injection 4 mg  4 mg Intravenous Q6H PRN Waynetta Sandy, MD      . oxyCODONE (Oxy IR/ROXICODONE) immediate release tablet 5-10 mg  5-10 mg Oral Q4H PRN Waynetta Sandy, MD      . oxyCODONE (Oxy IR/ROXICODONE) immediate release tablet 5-10 mg  5-10 mg Oral Q4H PRN Waynetta Sandy, MD   10 mg at  04/09/19 0914  . sucroferric oxyhydroxide (VELPHORO) chewable tablet 1,000 mg  1,000 mg Oral TID WC Waynetta Sandy, MD      . vitamin B-12 (CYANOCOBALAMIN) tablet 1,000 mcg  1,000 mcg  Oral QPM Waynetta Sandy, MD       Labs: Basic Metabolic Panel: Recent Labs  Lab 04/09/19 0559  NA 137  K 4.1  CL 93*  GLUCOSE 96  BUN 36*  CREATININE 7.00*   CBC: Recent Labs  Lab 04/09/19 0559  HGB 11.9*  HCT 35.0*   Studies/Results: PERIPHERAL VASCULAR CATHETERIZATION  Result Date: 04/09/2019 Patient name: Jose Garrett MRN: [id number] DOB: Oct 18, 1950 Sex: male 04/09/2019 Pre-operative Diagnosis: Critical right lower extremity ischemia with rest pain Post-operative diagnosis:  Same Surgeon:  Erlene Quan C. Donzetta Matters, MD Procedure Performed: 1.  Ultrasound-guided cannulation left common femoral artery 2.  Aortogram with bilateral lower extremity runoff 3.  Moderate sedation with fentanyl and Versed for 23 minutes 4.  Pullback pressure gradient right common iliac artery Indications: 69 year old male with history of left common femoral to below-knee popliteal artery bypass with graft.  He now has rest pain in his right lower extremity.  He is indicated for angiography with possible invention of the right lower extremity. Findings: The aorta is free of flow-limiting stenosis the renal arteries do not appear to fill and he is a known dialysis patient.  Right common iliac artery has significant calcification.  Pullback gradient there is only 10 mmHg systolic and this was not treated.  Right SFA is occluded at the takeoff.  He reconstitutes above the knee popliteal artery and this is diseased.  Below the knee appears to be a healthier popliteal artery with runoff via peroneal and posterior tibial artery.  On the left side his bypass is patent filling the below-knee popliteal artery and runoff is via the posterior tibial and peroneal arteries to the foot. He will be scheduled for right common femoral to  below-knee popliteal artery bypass.  Procedure:  The patient was identified in the holding area and taken to room 8.  The patient was then placed supine on the table and prepped and draped in the usual sterile fashion.  A time out was called.  Ultrasound was used to evaluate the right common femoral artery this was somewhat diseased the SFA was noted to be occluded.  Moderate sedation was administered his vital signs were monitored throughout the case.  Ultrasound was used to cannulate the common femoral artery where it was not diseased on the anterior wall.  And images saved department record.  Micropuncture wire followed by sheath was placed.  Bentson wire was placed to the aorta.  There was some difficulty tracking the Bentson wire.  We able to manipulate this into the aorta Omni catheter was placed to level 1 aortogram performed followed bilateral extremity runoff.  We then performed angled views of the right common iliac artery which demonstrated what appeared to be heavy calcification.  Pullback gradient was measured was 10 mmHg systolic.  This was not treated.  Catheter was removed.  Patient will be scheduled for right common femoral to below-knee popliteal artery bypass. Contrast: 117cc Brandon C. Donzetta Matters, MD [facility] [place] Office: [phone] Pager: [phone]    ROS: All others negative except those listed in HPI.  Physical Exam: Vitals:   04/09/19 1020 04/09/19 1035 04/09/19 1050 04/09/19 1105  BP: (!) 152/52 (!) 138/49 (!) 158/59 (!) 146/51  Pulse: 76 73 74 70  Resp: 20 19 19 11   Temp:      TempSrc:      SpO2: 96% 96% 99% 98%  Weight:      Height:         General:  WDWN, NAD, well appearing male Head: NCAT sclera not icteric MMM Neck: Supple. No lymphadenopathy Lungs: CTA bilaterally. No wheeze, rales or rhonchi. Breathing is unlabored. Heart: RRR. No murmur, rubs or gallops.  Lower extremities:trace to 1+ edema, + ischemic changes, no open wounds   Neuro: AAOx3. Moves all extremities spontaneously. Psych:  Responds to questions appropriately with a normal affect. Dialysis Access: RU AVF +b  Dialysis Orders:  MWF - [facility]  4hrs, BFR 400, DFR 800,  EDW 79kg, 2K/ 2Ca  Access: RU AVF +b  Heparin 2400 Mircera 30 mcg q2wks - last 04/01/19 Calcitriol 1.76mcg PO qHD Parsabiv 7.5mg  qHD - not on hospital formulary   Assessment/Plan: 1.  PAD w/critical RLE ischemia and rest pain - aortogram today, plan for right common femoral to below-knee popliteal artery bypass tomorrow. 2.  ESRD -  On HD MWF.  Orders written for HD off schedule today due to procedure tomorrow. K 4.1 3.  Hypertension/volume  - Blood pressure elevated. LE edema on exam, plan for UF 2-3L today.   4.  Anemia of CKD - Hgb 11.9. ESA recently given 5.  Secondary Hyperparathyroidism -  Will check phos and Ca.  Continue binders, and VDRA. 6.  Nutrition - Renal diet w/fluid restrictions 7. Thrombocytopenia - plt 51 8. Severe AS s/p TAVR 9. Ischemic CM - per primary   Jen Mow, PA-C [facility] Pager: [phone] 04/09/2019, 11:40 AM

## 2019-04-09 NOTE — H&P (Signed)
   History and Physical Update  The patient was interviewed and re-examined.  The patient's previous History and Physical has been reviewed and is unchanged from office visit yesterday. Plan is for angiogram today with likely bypass tomorrow pending results of today's bypass.   Zaviyar Rahal C. Donzetta Matters, MD [facility] [place] [place] Office: [phone] Pager: [phone]   04/09/2019, 7:16 AM

## 2019-04-10 ENCOUNTER — Inpatient Hospital Stay (HOSPITAL_COMMUNITY): Payer: Medicare Other | Admitting: Anesthesiology

## 2019-04-10 ENCOUNTER — Encounter (HOSPITAL_COMMUNITY)
Admission: RE | Disposition: A | Discharge status: Home Health | Payer: Self-pay | Source: Home / Self Care | Attending: Vascular Surgery

## 2019-04-10 DIAGNOSIS — I998 Other disorder of circulatory system: Secondary | ICD-10-CM

## 2019-04-10 HISTORY — PX: FEMORAL-POPLITEAL BYPASS GRAFT: SHX937

## 2019-04-10 LAB — BASIC METABOLIC PANEL
Anion gap: 15 (ref 5–15)
Anion gap: 19 — ABNORMAL HIGH (ref 5–15)
BUN: 26 mg/dL — ABNORMAL HIGH (ref 8–23)
BUN: 37 mg/dL — ABNORMAL HIGH (ref 8–23)
CO2: 29 mmol/L (ref 22–32)
CO2: 23 mmol/L (ref 22–32)
Calcium: 8.2 mg/dL — ABNORMAL LOW (ref 8.9–10.3)
Calcium: 8.1 mg/dL — ABNORMAL LOW (ref 8.9–10.3)
Chloride: 97 mmol/L — ABNORMAL LOW (ref 98–111)
Chloride: 98 mmol/L (ref 98–111)
Creatinine, Ser: 5.45 mg/dL — ABNORMAL HIGH (ref 0.61–1.24)
Creatinine, Ser: 6.39 mg/dL — ABNORMAL HIGH (ref 0.61–1.24)
GFR calc Af Amer: 12 mL/min — ABNORMAL LOW (ref >60)
GFR calc Af Amer: 10 mL/min — ABNORMAL LOW (ref >60)
GFR calc non Af Amer: 10 mL/min — ABNORMAL LOW (ref >60)
GFR calc non Af Amer: 8 mL/min — ABNORMAL LOW (ref >60)
Glucose, Bld: 93 mg/dL (ref 70–99)
Glucose, Bld: 122 mg/dL — ABNORMAL HIGH (ref 70–99)
Potassium: 5.1 mmol/L (ref 3.5–5.1)
Potassium: 5.6 mmol/L — ABNORMAL HIGH (ref 3.5–5.1)
Sodium: 141 mmol/L (ref 135–145)
Sodium: 140 mmol/L (ref 135–145)

## 2019-04-10 LAB — CBC
HCT: 34.6 % — ABNORMAL LOW (ref 39.0–52.0)
Hemoglobin: 10.9 g/dL — ABNORMAL LOW (ref 13.0–17.0)
MCH: 34.2 pg — ABNORMAL HIGH (ref 26.0–34.0)
MCHC: 31.5 g/dL (ref 30.0–36.0)
MCV: 108.5 fL — ABNORMAL HIGH (ref 80.0–100.0)
Platelets: 98 10*3/uL — ABNORMAL LOW (ref 150–400)
RBC: 3.19 MIL/uL — ABNORMAL LOW (ref 4.22–5.81)
RDW: 14.6 % (ref 11.5–15.5)
WBC: 8.4 10*3/uL (ref 4.0–10.5)
nRBC: 0 % (ref 0.0–0.2)

## 2019-04-10 SURGERY — BYPASS GRAFT FEMORAL-POPLITEAL ARTERY
Anesthesia: General | Site: Leg Upper | Laterality: Right

## 2019-04-10 MED ORDER — LIDOCAINE 2% (20 MG/ML) 5 ML SYRINGE
INTRAMUSCULAR | Status: AC
  Filled 2019-04-10: qty 5

## 2019-04-10 MED ORDER — PHENYLEPHRINE 40 MCG/ML (10ML) SYRINGE FOR IV PUSH (FOR BLOOD PRESSURE SUPPORT)
PREFILLED_SYRINGE | INTRAVENOUS | Status: AC
  Filled 2019-04-10: qty 10

## 2019-04-10 MED ORDER — SENNOSIDES-DOCUSATE SODIUM 8.6-50 MG PO TABS: 1.0000 | ORAL_TABLET | Freq: Every evening | ORAL | Status: DC | PRN

## 2019-04-10 MED ORDER — LIDOCAINE 2% (20 MG/ML) 5 ML SYRINGE
INTRAMUSCULAR | Status: DC | PRN
  Administered 2019-04-10: 60 mg via INTRAVENOUS

## 2019-04-10 MED ORDER — ONDANSETRON HCL 4 MG/2ML IJ SOLN
INTRAMUSCULAR | Status: AC
  Filled 2019-04-10: qty 2

## 2019-04-10 MED ORDER — OXYCODONE HCL 5 MG PO TABS: 5.0000 mg | ORAL_TABLET | Freq: Once | ORAL | Status: DC | PRN

## 2019-04-10 MED ORDER — DEXAMETHASONE SODIUM PHOSPHATE 10 MG/ML IJ SOLN
INTRAMUSCULAR | Status: AC
  Filled 2019-04-10: qty 1

## 2019-04-10 MED ORDER — HYDROMORPHONE HCL 1 MG/ML IJ SOLN: 0.5000 mg | INTRAMUSCULAR | Status: DC | PRN

## 2019-04-10 MED ORDER — PHENYLEPHRINE 40 MCG/ML (10ML) SYRINGE FOR IV PUSH (FOR BLOOD PRESSURE SUPPORT)
PREFILLED_SYRINGE | INTRAVENOUS | Status: DC | PRN
  Administered 2019-04-10: 80 ug via INTRAVENOUS

## 2019-04-10 MED ORDER — HYDRALAZINE HCL 20 MG/ML IJ SOLN: 5.0000 mg | INTRAMUSCULAR | Status: DC | PRN

## 2019-04-10 MED ORDER — SODIUM CHLORIDE 0.9 % IV SOLN: 500.0000 mL | Freq: Once | INTRAVENOUS | Status: DC | PRN

## 2019-04-10 MED ORDER — ACETAMINOPHEN 325 MG RE SUPP: 325.0000 mg | RECTAL | Status: DC | PRN

## 2019-04-10 MED ORDER — FENTANYL CITRATE (PF) 250 MCG/5ML IJ SOLN
INTRAMUSCULAR | Status: DC | PRN
  Administered 2019-04-10 (×2): 50 ug via INTRAVENOUS
  Administered 2019-04-10 (×2): 100 ug via INTRAVENOUS

## 2019-04-10 MED ORDER — PROPOFOL 10 MG/ML IV BOLUS
INTRAVENOUS | Status: DC | PRN
  Administered 2019-04-10: 20 mg via INTRAVENOUS
  Administered 2019-04-10: 110 mg via INTRAVENOUS

## 2019-04-10 MED ORDER — FENTANYL CITRATE (PF) 250 MCG/5ML IJ SOLN
INTRAMUSCULAR | Status: AC
  Filled 2019-04-10: qty 5

## 2019-04-10 MED ORDER — FLEET ENEMA 7-19 GM/118ML RE ENEM: 1.0000 | ENEMA | Freq: Once | RECTAL | Status: DC | PRN

## 2019-04-10 MED ORDER — DEXAMETHASONE SODIUM PHOSPHATE 10 MG/ML IJ SOLN
INTRAMUSCULAR | Status: DC | PRN
  Administered 2019-04-10: 8 mg via INTRAVENOUS

## 2019-04-10 MED ORDER — BISACODYL 5 MG PO TBEC: 5.0000 mg | DELAYED_RELEASE_TABLET | Freq: Every day | ORAL | Status: DC | PRN

## 2019-04-10 MED ORDER — DOCUSATE SODIUM 100 MG PO CAPS
100.0000 mg | ORAL_CAPSULE | Freq: Every day | ORAL | Status: DC
  Administered 2019-04-13 – 2019-04-16 (×4): 100 mg via ORAL
  Filled 2019-04-10 (×5): qty 1

## 2019-04-10 MED ORDER — PROTAMINE SULFATE 10 MG/ML IV SOLN
INTRAVENOUS | Status: AC
  Filled 2019-04-10: qty 5

## 2019-04-10 MED ORDER — SODIUM ZIRCONIUM CYCLOSILICATE 10 G PO PACK
10.0000 g | PACK | Freq: Once | ORAL | Status: AC
  Administered 2019-04-10: 10 g via ORAL
  Filled 2019-04-10: qty 1

## 2019-04-10 MED ORDER — HEPARIN SODIUM (PORCINE) 1000 UNIT/ML IJ SOLN
INTRAMUSCULAR | Status: DC | PRN
  Administered 2019-04-10: 8000 [IU] via INTRAVENOUS

## 2019-04-10 MED ORDER — PHENOL 1.4 % MT LIQD: 1.0000 | OROMUCOSAL | Status: DC | PRN

## 2019-04-10 MED ORDER — HEPARIN SODIUM (PORCINE) 5000 UNIT/ML IJ SOLN
5000.0000 [IU] | Freq: Three times a day (TID) | INTRAMUSCULAR | Status: DC
  Administered 2019-04-10 – 2019-04-16 (×17): 5000 [IU] via SUBCUTANEOUS
  Filled 2019-04-10 (×17): qty 1

## 2019-04-10 MED ORDER — CEFAZOLIN SODIUM-DEXTROSE 2-4 GM/100ML-% IV SOLN
INTRAVENOUS | Status: AC
  Filled 2019-04-10: qty 100

## 2019-04-10 MED ORDER — PANTOPRAZOLE SODIUM 40 MG PO TBEC
40.0000 mg | DELAYED_RELEASE_TABLET | Freq: Every day | ORAL | Status: DC
  Administered 2019-04-11 – 2019-04-16 (×6): 40 mg via ORAL
  Filled 2019-04-10 (×6): qty 1

## 2019-04-10 MED ORDER — PROPOFOL 10 MG/ML IV BOLUS
INTRAVENOUS | Status: AC
  Filled 2019-04-10: qty 20

## 2019-04-10 MED ORDER — METOPROLOL TARTRATE 5 MG/5ML IV SOLN: 2.0000 mg | INTRAVENOUS | Status: DC | PRN

## 2019-04-10 MED ORDER — CEFAZOLIN SODIUM-DEXTROSE 2-3 GM-%(50ML) IV SOLR
INTRAVENOUS | Status: DC | PRN
  Administered 2019-04-10: 2 g via INTRAVENOUS

## 2019-04-10 MED ORDER — MAGNESIUM SULFATE 2 GM/50ML IV SOLN: 2.0000 g | Freq: Every day | INTRAVENOUS | Status: DC | PRN

## 2019-04-10 MED ORDER — SUCCINYLCHOLINE CHLORIDE 200 MG/10ML IV SOSY
PREFILLED_SYRINGE | INTRAVENOUS | Status: AC
  Filled 2019-04-10: qty 10

## 2019-04-10 MED ORDER — ROCURONIUM BROMIDE 10 MG/ML (PF) SYRINGE
PREFILLED_SYRINGE | INTRAVENOUS | Status: AC
  Filled 2019-04-10: qty 10

## 2019-04-10 MED ORDER — SODIUM CHLORIDE 0.9 % IV SOLN
INTRAVENOUS | Status: AC
  Filled 2019-04-10: qty 1.2

## 2019-04-10 MED ORDER — ALUM & MAG HYDROXIDE-SIMETH 200-200-20 MG/5ML PO SUSP: 15.0000 mL | ORAL | Status: DC | PRN

## 2019-04-10 MED ORDER — PROTAMINE SULFATE 10 MG/ML IV SOLN
INTRAVENOUS | Status: DC | PRN
  Administered 2019-04-10: 20 mg via INTRAVENOUS
  Administered 2019-04-10: 10 mg via INTRAVENOUS
  Administered 2019-04-10: 20 mg via INTRAVENOUS

## 2019-04-10 MED ORDER — MIDAZOLAM HCL 5 MG/5ML IJ SOLN
INTRAMUSCULAR | Status: DC | PRN
  Administered 2019-04-10: 2 mg via INTRAVENOUS

## 2019-04-10 MED ORDER — ACETAMINOPHEN 325 MG PO TABS: 325.0000 mg | ORAL_TABLET | ORAL | Status: DC | PRN

## 2019-04-10 MED ORDER — SODIUM CHLORIDE 0.9 % IV SOLN: INTRAVENOUS | Status: DC

## 2019-04-10 MED ORDER — HEPARIN SODIUM (PORCINE) 1000 UNIT/ML IJ SOLN
INTRAMUSCULAR | Status: AC
  Filled 2019-04-10: qty 1

## 2019-04-10 MED ORDER — CEFAZOLIN SODIUM-DEXTROSE 2-4 GM/100ML-% IV SOLN
2.0000 g | Freq: Three times a day (TID) | INTRAVENOUS | Status: AC
  Administered 2019-04-10 – 2019-04-11 (×2): 2 g via INTRAVENOUS
  Filled 2019-04-10 (×2): qty 100

## 2019-04-10 MED ORDER — PROMETHAZINE HCL 25 MG/ML IJ SOLN: 6.2500 mg | INTRAMUSCULAR | Status: DC | PRN

## 2019-04-10 MED ORDER — 0.9 % SODIUM CHLORIDE (POUR BTL) OPTIME
TOPICAL | Status: DC | PRN
  Administered 2019-04-10: 2000 mL

## 2019-04-10 MED ORDER — SODIUM CHLORIDE 0.9 % IV SOLN
INTRAVENOUS | Status: DC | PRN
  Administered 2019-04-10: 500 mL

## 2019-04-10 MED ORDER — PHENYLEPHRINE HCL-NACL 10-0.9 MG/250ML-% IV SOLN
INTRAVENOUS | Status: DC | PRN
  Administered 2019-04-10: 25 ug/min via INTRAVENOUS
  Administered 2019-04-10: 55 ug/min via INTRAVENOUS

## 2019-04-10 MED ORDER — HYDROMORPHONE HCL 1 MG/ML IJ SOLN: 0.2500 mg | INTRAMUSCULAR | Status: DC | PRN

## 2019-04-10 MED ORDER — MIDAZOLAM HCL 2 MG/2ML IJ SOLN
INTRAMUSCULAR | Status: AC
  Filled 2019-04-10: qty 2

## 2019-04-10 MED ORDER — POTASSIUM CHLORIDE CRYS ER 20 MEQ PO TBCR: 20.0000 meq | EXTENDED_RELEASE_TABLET | Freq: Every day | ORAL | Status: DC | PRN

## 2019-04-10 MED ORDER — OXYCODONE HCL 5 MG/5ML PO SOLN: 5.0000 mg | Freq: Once | ORAL | Status: DC | PRN

## 2019-04-10 MED ORDER — SUCCINYLCHOLINE CHLORIDE 20 MG/ML IJ SOLN
INTRAMUSCULAR | Status: DC | PRN
  Administered 2019-04-10: 120 mg via INTRAVENOUS

## 2019-04-10 MED ORDER — GUAIFENESIN-DM 100-10 MG/5ML PO SYRP: 15.0000 mL | ORAL_SOLUTION | ORAL | Status: DC | PRN

## 2019-04-10 SURGICAL SUPPLY — 59 items
ADH SKN CLS APL DERMABOND .7 (GAUZE/BANDAGES/DRESSINGS) ×3
BANDAGE ESMARK 6X9 LF (GAUZE/BANDAGES/DRESSINGS) IMPLANT
BNDG CMPR 9X6 STRL LF SNTH (GAUZE/BANDAGES/DRESSINGS)
BNDG ESMARK 6X9 LF (GAUZE/BANDAGES/DRESSINGS)
CANISTER SUCT 3000ML PPV (MISCELLANEOUS) ×3 IMPLANT
CANNULA VESSEL 3MM 2 BLNT TIP (CANNULA) ×8 IMPLANT
CLIP LIGATING EXTRA MED SLVR (CLIP) ×5 IMPLANT
CLIP LIGATING EXTRA SM BLUE (MISCELLANEOUS) ×3 IMPLANT
COVER PROBE W GEL 5X96 (DRAPES) ×2 IMPLANT
COVER WAND RF STERILE (DRAPES) ×1 IMPLANT
CUFF TOURN SGL QUICK 34 (TOURNIQUET CUFF)
CUFF TOURN SGL QUICK 42 (TOURNIQUET CUFF) IMPLANT
CUFF TRNQT CYL 34X4.125X (TOURNIQUET CUFF) IMPLANT
DERMABOND ADVANCED (GAUZE/BANDAGES/DRESSINGS) ×6
DERMABOND ADVANCED .7 DNX12 (GAUZE/BANDAGES/DRESSINGS) ×1 IMPLANT
DRAIN SNY 10X20 3/4 PERF (WOUND CARE) IMPLANT
DRAPE HALF SHEET 40X57 (DRAPES) IMPLANT
DRAPE X-RAY CASS 24X20 (DRAPES) IMPLANT
ELECT REM PT RETURN 9FT ADLT (ELECTROSURGICAL) ×3
ELECTRODE REM PT RTRN 9FT ADLT (ELECTROSURGICAL) ×1 IMPLANT
EVACUATOR SILICONE 100CC (DRAIN) IMPLANT
GLOVE BIO SURGEON STRL SZ 6 (GLOVE) ×4 IMPLANT
GLOVE BIO SURGEON STRL SZ7 (GLOVE) ×2 IMPLANT
GLOVE BIO SURGEON STRL SZ7.5 (GLOVE) ×2 IMPLANT
GLOVE BIOGEL PI IND STRL 6.5 (GLOVE) IMPLANT
GLOVE BIOGEL PI IND STRL 7.0 (GLOVE) IMPLANT
GLOVE BIOGEL PI IND STRL 7.5 (GLOVE) IMPLANT
GLOVE BIOGEL PI INDICATOR 6.5 (GLOVE) ×10
GLOVE BIOGEL PI INDICATOR 7.0 (GLOVE) ×2
GLOVE BIOGEL PI INDICATOR 7.5 (GLOVE) ×2
GLOVE ECLIPSE 7.0 STRL STRAW (GLOVE) ×2 IMPLANT
GLOVE SS BIOGEL STRL SZ 7.5 (GLOVE) ×1 IMPLANT
GLOVE SUPERSENSE BIOGEL SZ 7.5 (GLOVE) ×2
GOWN STRL REUS W/ TWL LRG LVL3 (GOWN DISPOSABLE) ×3 IMPLANT
GOWN STRL REUS W/TWL LRG LVL3 (GOWN DISPOSABLE) ×21
INSERT FOGARTY SM (MISCELLANEOUS) IMPLANT
KIT BASIN OR (CUSTOM PROCEDURE TRAY) ×3 IMPLANT
KIT TURNOVER KIT B (KITS) ×3 IMPLANT
NS IRRIG 1000ML POUR BTL (IV SOLUTION) ×6 IMPLANT
PACK PERIPHERAL VASCULAR (CUSTOM PROCEDURE TRAY) ×3 IMPLANT
PAD ARMBOARD 7.5X6 YLW CONV (MISCELLANEOUS) ×6 IMPLANT
PADDING CAST COTTON 6X4 STRL (CAST SUPPLIES) IMPLANT
SET COLLECT BLD 21X3/4 12 (NEEDLE) IMPLANT
STOPCOCK 4 WAY LG BORE MALE ST (IV SETS) IMPLANT
SUT ETHILON 3 0 PS 1 (SUTURE) IMPLANT
SUT MNCRL AB 4-0 PS2 18 (SUTURE) ×2 IMPLANT
SUT PROLENE 5 0 C 1 24 (SUTURE) ×5 IMPLANT
SUT PROLENE 6 0 CC (SUTURE) ×13 IMPLANT
SUT SILK 2 0 SH (SUTURE) ×3 IMPLANT
SUT SILK 3 0 (SUTURE) ×9
SUT SILK 3-0 18XBRD TIE 12 (SUTURE) IMPLANT
SUT VIC AB 2-0 CTX 36 (SUTURE) ×6 IMPLANT
SUT VIC AB 3-0 SH 27 (SUTURE) ×12
SUT VIC AB 3-0 SH 27X BRD (SUTURE) ×2 IMPLANT
TOWEL GREEN STERILE (TOWEL DISPOSABLE) ×3 IMPLANT
TRAY FOLEY MTR SLVR 16FR STAT (SET/KITS/TRAYS/PACK) ×1 IMPLANT
TUBING EXTENTION W/L.L. (IV SETS) IMPLANT
UNDERPAD 30X30 (UNDERPADS AND DIAPERS) ×3 IMPLANT
WATER STERILE IRR 1000ML POUR (IV SOLUTION) ×3 IMPLANT

## 2019-04-10 NOTE — Anesthesia Procedure Notes (Signed)
Procedure Name: Intubation Date/Time: 04/10/2019 7:39 AM Performed by: Mariea Clonts, CRNA Pre-anesthesia Checklist: Patient identified, Emergency Drugs available, Suction available and Patient being monitored Patient Re-evaluated:Patient Re-evaluated prior to induction Oxygen Delivery Method: Circle System Utilized Preoxygenation: Pre-oxygenation with 100% oxygen Induction Type: IV induction, Cricoid Pressure applied and Rapid sequence Laryngoscope Size: Miller and 2 Grade View: Grade I Tube type: Oral Number of attempts: 1 Airway Equipment and Method: Stylet and Oral airway Placement Confirmation: ETT inserted through vocal cords under direct vision,  positive ETCO2 and breath sounds checked- equal and bilateral Tube secured with: Tape Dental Injury: Teeth and Oropharynx as per pre-operative assessment

## 2019-04-10 NOTE — Interval H&P Note (Signed)
History and Physical Interval Note:  04/10/2019 6:45 AM  Jose Garrett  has presented today for surgery, with the diagnosis of CRITICAL LIMB ISCHEMIA.  The various methods of treatment have been discussed with the patient and family. After consideration of risks, benefits and other options for treatment, the patient has consented to  Procedure(s): BYPASS GRAFT RIGHT FEMORAL-POPLITEAL ARTERY (Right) as a surgical intervention.  The patient's history has been reviewed, patient examined, no change in status, stable for surgery.  I have reviewed the patient's chart and labs.  Questions were answered to the patient's satisfaction.     Curt Jews

## 2019-04-10 NOTE — Discharge Instructions (Signed)
 [facility] [place]  Discharge instructions  Lower Extremity Bypass Surgery  Please refer to the following instruction for your post-procedure care. Your surgeon or physician assistant will discuss any changes with you.  Activity  You are encouraged to walk as much as you can. You can slowly return to normal activities during the month after your surgery. Avoid strenuous activity and heavy lifting until your doctor tells you it's OK. Avoid activities such as vacuuming or swinging a golf club. Do not drive until your doctor give the OK and you are no longer taking prescription pain medications. It is also normal to have difficulty with sleep habits, eating and bowel movement after surgery. These will go away with time.  Bathing/Showering  You may shower after you go home. Do not soak in a bathtub, hot tub, or swim until the incision heals completely.  Incision Care  Clean your incision with mild soap and water. Shower every day. Pat the area dry with a clean towel. You do not need a bandage unless otherwise instructed. Do not apply any ointments or creams to your incision. If you have open wounds you will be instructed how to care for them or a visiting nurse may be arranged for you. If you have staples or sutures along your incision they will be removed at your post-op appointment. You may have skin glue on your incision. Do not peel it off. It will come off on its own in about one week. If you have a great deal of moisture in your groin, use a gauze help keep this area dry.  Diet  Resume your normal diet. There are no special food restrictions following this procedure. A low fat/ low cholesterol diet is recommended for all patients with vascular disease. In order to heal from your surgery, it is CRITICAL to get adequate nutrition. Your body requires vitamins, minerals, and protein. Vegetables are the best source of vitamins and minerals. Vegetables also provide the  perfect balance of protein. Processed food has little nutritional value, so try to avoid this.  Medications  Resume taking all your medications unless your doctor or nurse practitioner tells you not to. If your incision is causing pain, you may take over-the-counter pain relievers such as acetaminophen (Tylenol). If you were prescribed a stronger pain medication, please aware these medication can cause nausea and constipation. Prevent nausea by taking the medication with a snack or meal. Avoid constipation by drinking plenty of fluids and eating foods with high amount of fiber, such as fruits, vegetables, and grains. Take Colase 100 mg (an over-the-counter stool softener) twice a day as needed for constipation. Do not take Tylenol if you are taking prescription pain medications.  Follow Up  Our office will schedule a follow up appointment 2-3 weeks following discharge.  Please call us immediately for any of the following conditions  Severe or worsening pain in your legs or feet while at rest or while walking Increase pain, redness, warmth, or drainage (pus) from your incision site(s) Fever of 101 degree or higher The swelling in your leg with the bypass suddenly worsens and becomes more painful than when you were in the hospital If you have been instructed to feel your graft pulse then you should do so every day. If you can no longer feel this pulse, call the office immediately. Not all patients are given this instruction.  Leg swelling is common after leg bypass surgery.  The swelling should improve over a few months   following surgery. To improve the swelling, you may elevate your legs above the level of your heart while you are sitting or resting. Your surgeon or physician assistant may ask you to apply an ACE wrap or wear compression (TED) stockings to help to reduce swelling.  Reduce your risk of vascular disease  Stop smoking. If you would like help call [facility] at 1-800-QUIT-NOW  (1-800-784-8669) or  at 336-586-4000.  Manage your cholesterol Maintain a desired weight Control your diabetes weight Control your diabetes Keep your blood pressure down  If you have any questions, please call the office at [phone]   

## 2019-04-10 NOTE — Anesthesia Preprocedure Evaluation (Signed)
Anesthesia Evaluation  Patient identified by MRN, date of birth, ID band Patient awake    Reviewed: Allergy & Precautions, NPO status , Patient's Chart, lab work & pertinent test results, reviewed documented beta blocker date and time   History of Anesthesia Complications (+) PONV and history of anesthetic complications  Airway Mallampati: II  TM Distance: >3 FB Neck ROM: Full    Dental  (+) Edentulous Lower, Edentulous Upper   Pulmonary shortness of breath, pneumonia, former smoker,    Pulmonary exam normal breath sounds clear to auscultation       Cardiovascular hypertension, Pt. on medications and Pt. on home beta blockers + CAD, + Past MI, + Peripheral Vascular Disease, +CHF and + DOE  Normal cardiovascular exam+ Valvular Problems/Murmurs AS  Rhythm:Regular Rate:Normal  Echo 07/2018   1. The left ventricle has moderately reduced systolic function, with an ejection fraction of 35-40%. The cavity size was normal. There is moderately increased left ventricular wall thickness. Left ventricular diastolic Doppler parameters are consistent with impaired relaxation. Elevated left atrial and left ventricular end-diastolic pressures The E/e' is >15.  2. Moderate akinesis of the left ventricular, entire inferior wall.  3. The right ventricle has normal systolic function. The cavity was normal. There is no increase in right ventricular wall thickness.  4. The mitral valve is abnormal. Mild thickening of the mitral valve leaflet. There is mild mitral annular calcification present.  5. The tricuspid valve is grossly normal.  6. The aortic valve is tricuspid. Moderate calcification of the aortic valve. Aortic valve regurgitation is mild by color flow Doppler. Moderate-severe stenosis of the aortic valve.  7. The inferior vena cava was dilated in size with <50% respiratory variability.  8. The interatrial septum was not assessed.  9. When compared  to the prior study: 03/24/2018: LVEF 40-45%, moderate aortic stenosis - mean gradient 30 mmHg.     Neuro/Psych Anxiety negative neurological ROS     GI/Hepatic Neg liver ROS, GERD  Medicated and Controlled,  Endo/Other  negative endocrine ROS  Renal/GU ESRF and DialysisRenal disease     Musculoskeletal  (+) Arthritis ,   Abdominal   Peds  Hematology  (+) anemia , HLD   Anesthesia Other Findings nonviable tissue  Reproductive/Obstetrics                             Anesthesia Physical  Anesthesia Plan  ASA: IV  Anesthesia Plan: General   Post-op Pain Management:    Induction: Intravenous  PONV Risk Score and Plan: 3 and Ondansetron, Dexamethasone, Treatment may vary due to age or medical condition and Midazolam  Airway Management Planned: Oral ETT  Additional Equipment: Arterial line  Intra-op Plan:   Post-operative Plan:   Informed Consent: I have reviewed the patients History and Physical, chart, labs and discussed the procedure including the risks, benefits and alternatives for the proposed anesthesia with the patient or authorized representative who has indicated his/her understanding and acceptance.     Dental advisory given  Plan Discussed with: CRNA  Anesthesia Plan Comments: (Reviewed PAT note written 08/18/2018 by Myra Gianotti, PA-C. )        Anesthesia Quick Evaluation

## 2019-04-10 NOTE — Op Note (Signed)
    OPERATIVE REPORT  DATE OF SURGERY: 04/10/2019  PATIENT: Jose Garrett, 69 y.o. male MRN: [id number]  DOB: 1950/08/28  PRE-OPERATIVE DIAGNOSIS: Critical limb ischemia right foot  POST-OPERATIVE DIAGNOSIS:  Same  PROCEDURE: Right femoral to below-knee popliteal bypass with translocated nonreversed great saphenous vein  SURGEON:  Curt Jews, M.D.  PHYSICIAN ASSISTANT: Dr. Juanda Crumble fields, Laurence Slate, PA-C  ANESTHESIA: General  EBL: per anesthesia record  Total I/O In: 700 [I.V.:700] Out: 75 [Blood:75]  BLOOD ADMINISTERED: none  DRAINS: none  SPECIMEN: none  COUNTS CORRECT:  YES  PATIENT DISPOSITION:  PACU - hemodynamically stable  PROCEDURE DETAILS: Patient was taken operating placed supine position where the area of the right groin right leg prepped draped in usual sterile fashion.  Incision was made over the femoral pulse and carried down to isolate the common superficial femoral and profundus femoris arteries.  Supra femoral artery was occluded at its origin.  The saphenous vein was identified at the saphenofemoral junction and was of good caliber.  The vein had been marked with SonoSite and was good caliber throughout its course.  The vein was harvested from the groin to the calf using multiple skin bridges.  The below-knee popliteal artery was exposed through the medial approach through the same incision.  The vein was ligated distally and divided.  Tributary branches were ligated and divided.  The saphenous vein was oversewn with a 5-0 Prolene suture after division.  The vein was gently dilated and was found to be of adequate size.  The vein was marked to reduce risk of twisting.  A tunnel was created from the level of the popliteal space to the groin.  Patient was given 8000 intravenous heparin and after adequate circulation time the common superficial femoral and profundus femoris arteries were occluded.  The common femoral artery was opened with 11 blade and sent  longstanding with Potts scissors.  The vein was brought onto the field and was spatulated and sewn end-to-side to the artery with a running 6-0 Prolene suture.  This was nonreversed.  The anastomosis was tested and found to be adequate.  Next a Farrel Demark was used to lyse the vein valves giving excellent flow through the vein graft.  The vein was then brought to the prior created tunnel down to the level of the below-knee popliteal artery.  The artery was occluded proximally distally and was opened with 11 blade sent longstanding with Potts scissors.  The below-knee popliteal artery was extensively calcified but had a widely patent lumen.  The vein was cut to the appropriate length and was spatulated and sewn end-to-side to the artery with a running 6-0 Prolene suture.  Clamps removed and excellent flow which was the graft dependent was noted in the foot.  The patient was given 50 mg of protamine to reverse heparin.  Wounds irrigated with saline.  Hemostasis electrocautery.  Wounds were closed with 3-0 Vicryl in the subcutaneous and subcuticular tissue.  Sterile dressing was applied the patient was transferred to the recovery room in stable condition   Rosetta Posner, M.D., [facility] 04/10/2019 2:00 PM

## 2019-04-10 NOTE — Anesthesia Postprocedure Evaluation (Signed)
Anesthesia Post Note  Patient: Jose Garrett  Procedure(s) Performed: BYPASS GRAFT RIGHT FEMORAL-POPLITEAL ARTERY (Right Leg Upper)     Patient location during evaluation: PACU Anesthesia Type: General Level of consciousness: awake and alert Pain management: pain level controlled Vital Signs Assessment: post-procedure vital signs reviewed and stable Respiratory status: spontaneous breathing, nonlabored ventilation and respiratory function stable Cardiovascular status: blood pressure returned to baseline and stable Postop Assessment: no apparent nausea or vomiting Anesthetic complications: no    Last Vitals:  Vitals:   04/10/19 1203 04/10/19 1212  BP: (!) 132/55 (!) 131/52  Pulse: 75 72  Resp: 17 17  Temp: 36.7 C   SpO2: 91% 96%    Last Pain:  Vitals:   04/10/19 1203  TempSrc: Oral  PainSc:                  Lynda Rainwater

## 2019-04-10 NOTE — Progress Notes (Signed)
[facility] Progress Note   Dialysis Orders: MWF - [facility]  4hrs, BFR 400, DFR 800,  EDW 79kg, 2K/ 2Ca  Access: RU AVF +b  Heparin 2400 Mircera 30 mcg q2wks - last 04/01/19 Calcitriol 1.7mcg PO qHD Parsabiv 7.5mg  qHD - not on hospital formulary   Assessment/Plan: 1.  PAD w/critical RLE ischemia and rest pain - s/p  right common femoral to below-knee popliteal artery 1/29 foot still cool, dusky 2.  ESRD -  MWF.  HD yesterday and then Saturday - back on schedule next week K 5.1 pre op - repeat this afternoon- if still high give lokelma - only ran 3 hr Thursday - plan for 3.5 hr on Sat -if K > 5 needs 4 hr  3.  Hypertension/volume  - Blood pressure better - net UF 2.5 Thursday  - post wt 78.1 - actually on low dose BB and midodrine for BP support -  4.  Anemia of CKD - Hgb 11.9 > 10.9 - continue to trend. ESA recently given 5.  Secondary Hyperparathyroidism -  Will check phos and Ca.  Continue binders, and VDRA. 6.  Nutrition - Renal diet w/fluid restrictions 7. Thrombocytopenia - plt 51 8. Severe AS s/p TAVR 9. NSVT - on amiodarone 10. Ischemic CM - per primary  Myriam Jacobson, PA-C [facility] Beeper [phone] 04/10/2019,1:04 PM  LOS: 1 day   Subjective:   Drowsy s/p LE bypass surgery this am.    Objective Vitals:   04/10/19 1130 04/10/19 1145 04/10/19 1203 04/10/19 1212  BP: (!) 131/50  (!) 132/55 (!) 131/52  Pulse: 72  75 72  Resp: 17  17 17   Temp:  99 F (37.2 C) 98.1 F (36.7 C)   TempSrc:   Oral   SpO2: 99%  91% 96%  Weight:      Height:       Physical Exam General: NAD doing well post op, dozing on and off  Heart: RRR Lungs: no rales Abdomen: soft NT Extremities: no LE edema, right LE incisions look good - foot dusky cool Dialysis Access: right upper AVF + bruit   Additional Objective Labs: Basic Metabolic Panel: Recent Labs  Lab 04/09/19 0559 04/09/19 1948 04/10/19 0348  NA 137  --  141  K 4.1  --  5.1  CL 93*  --   97*  CO2  --   --  29  GLUCOSE 96  --  93  BUN 36*  --  26*  CREATININE 7.00* 4.69* 5.45*  CALCIUM  --   --  8.2*   Liver Function Tests: No results for input(s): AST, ALT, ALKPHOS, BILITOT, PROT, ALBUMIN in the last 168 hours. No results for input(s): LIPASE, AMYLASE in the last 168 hours. CBC: Recent Labs  Lab 04/09/19 0559 04/09/19 1948 04/10/19 0348  WBC  --  11.3* 8.4  HGB 11.9* 11.3* 10.9*  HCT 35.0* 35.0* 34.6*  MCV  --  106.7* 108.5*  PLT  --  102* 98*   Blood Culture    Component Value Date/Time   SDES URINE, RANDOM 02/01/2016 1707   SPECREQUEST NONE 02/01/2016 1707   CULT (A) 02/01/2016 1707    10,000 COLONIES/mL PSEUDOMONAS AERUGINOSA 40,000 COLONIES/mL ENTEROBACTER AEROGENES    REPTSTATUS 02/04/2016 FINAL 02/01/2016 1707    Cardiac Enzymes: No results for input(s): CKTOTAL, CKMB, CKMBINDEX, TROPONINI in the last 168 hours. CBG: No results for input(s): GLUCAP in the last 168 hours. Iron Studies: No results for input(s): IRON, TIBC,  TRANSFERRIN, FERRITIN in the last 72 hours. Lab Results  Component Value Date   INR 1.3 (H) 10/16/2018   INR 1.2 07/22/2018   INR 1.27 01/23/2016   PROTIME 13.2 01/21/2015   Studies/Results: PERIPHERAL VASCULAR CATHETERIZATION  Result Date: 04/09/2019 Patient name: Jose Garrett MRN: [id number] DOB: Nov 04, 1950 Sex: male 04/09/2019 Pre-operative Diagnosis: Critical right lower extremity ischemia with rest pain Post-operative diagnosis:  Same Surgeon:  Eda Paschal. Donzetta Matters, MD Procedure Performed: 1.  Ultrasound-guided cannulation left common femoral artery 2.  Aortogram with bilateral lower extremity runoff 3.  Moderate sedation with fentanyl and Versed for 23 minutes 4.  Pullback pressure gradient right common iliac artery Indications: 69 year old male with history of left common femoral to below-knee popliteal artery bypass with graft.  He now has rest pain in his right lower extremity.  He is indicated for angiography with possible  invention of the right lower extremity. Findings: The aorta is free of flow-limiting stenosis the renal arteries do not appear to fill and he is a known dialysis patient.  Right common iliac artery has significant calcification.  Pullback gradient there is only 10 mmHg systolic and this was not treated.  Right SFA is occluded at the takeoff.  He reconstitutes above the knee popliteal artery and this is diseased.  Below the knee appears to be a healthier popliteal artery with runoff via peroneal and posterior tibial artery.  On the left side his bypass is patent filling the below-knee popliteal artery and runoff is via the posterior tibial and peroneal arteries to the foot. He will be scheduled for right common femoral to below-knee popliteal artery bypass.  Procedure:  The patient was identified in the holding area and taken to room 8.  The patient was then placed supine on the table and prepped and draped in the usual sterile fashion.  A time out was called.  Ultrasound was used to evaluate the right common femoral artery this was somewhat diseased the SFA was noted to be occluded.  Moderate sedation was administered his vital signs were monitored throughout the case.  Ultrasound was used to cannulate the common femoral artery where it was not diseased on the anterior wall.  And images saved department record.  Micropuncture wire followed by sheath was placed.  Bentson wire was placed to the aorta.  There was some difficulty tracking the Bentson wire.  We able to manipulate this into the aorta Omni catheter was placed to level 1 aortogram performed followed bilateral extremity runoff.  We then performed angled views of the right common iliac artery which demonstrated what appeared to be heavy calcification.  Pullback gradient was measured was 10 mmHg systolic.  This was not treated.  Catheter was removed.  Patient will be scheduled for right common femoral to below-knee popliteal artery bypass. Contrast: 117cc  Brandon C. Donzetta Matters, MD [facility] Office: [phone] Pager: [phone]   Medications: . sodium chloride    . sodium chloride    .  ceFAZolin (ANCEF) IV    . magnesium sulfate bolus IVPB     . amiodarone  200 mg Oral Daily  . atorvastatin  80 mg Oral Q supper  . calcitRIOL  1.5 mcg Oral Q M,W,F-HD  . calcitRIOL  1.5 mcg Oral Once in dialysis  . carvedilol  3.125 mg Oral BID  . Chlorhexidine Gluconate Cloth  6 each Topical Q0600  . clopidogrel  75 mg Oral Q supper  . [START ON 04/11/2019] docusate sodium  100  mg Oral Daily  . ferric citrate  630 mg Oral TID WC  . heparin  5,000 Units Subcutaneous Q8H  . midodrine  10 mg Oral Q M,W,F-HD  . multivitamin  1 tablet Oral QHS  . [START ON 04/11/2019] pantoprazole  40 mg Oral Daily  . vitamin B-12  1,000 mcg Oral QPM

## 2019-04-10 NOTE — Transfer of Care (Signed)
Immediate Anesthesia Transfer of Care Note  Patient: WALEED DETTMAN  Procedure(s) Performed: BYPASS GRAFT RIGHT FEMORAL-POPLITEAL ARTERY (Right Leg Upper)  Patient Location: PACU  Anesthesia Type:General  Level of Consciousness: awake, alert  and oriented  Airway & Oxygen Therapy: Patient Spontanous Breathing and Patient connected to nasal cannula oxygen  Post-op Assessment: Report given to RN, Post -op Vital signs reviewed and stable and Patient moving all extremities X 4  Post vital signs: Reviewed and stable  Last Vitals:  Vitals Value Taken Time  BP 123/61 04/10/19 1113  Temp    Pulse 72 04/10/19 1115  Resp 17 04/10/19 1115  SpO2 98 % 04/10/19 1115  Vitals shown include unvalidated device data.  Last Pain:  Vitals:   04/10/19 0407  TempSrc: Oral  PainSc:       Patients Stated Pain Goal: 1 (23/34/35 6861)  Complications: No apparent anesthesia complications

## 2019-04-10 NOTE — Evaluation (Addendum)
Physical Therapy Evaluation Patient Details Name: Jose Garrett MRN: [id number] DOB: 12-05-1950 Today's Date: 04/10/2019   History of Present Illness  Patient is a 69 y/o male who presents with RLE ischemia now s/p Right femoral to below-knee popliteal bypass with translocated nonreversed great saphenous vein 1/29. PMh includes aortic stenosis, HTN, HLD, CHF, CAD, PAD, anxiety, ESRD on HD,  Clinical Impression  Patient presents with pain, lethargy, post op deficits of RLE and impaired mobility s/p above. Pt lives alone and uses [facility] for ambulation PTA. Today, pt still lethargic from the anesthesia. Requires Min A for standing transfers and reluctant to place weight through RLE. Reports hypersensitivity of RLE. Encouraged desensitization techniques. Mobility limited due to pain and lethargy. Pt agreeable to transfer to chair for dinner. If pt not able to find someone to stay with him at d/c, pending improvement will likely need SNF to maximize independence and mobility prior to return home. Will follow acutely.    Follow Up Recommendations SNF;Supervision for mobility/OOB    Equipment Recommendations  None recommended by PT    Recommendations for Other Services       Precautions / Restrictions Precautions Precautions: Fall Restrictions Weight Bearing Restrictions: No      Mobility  Bed Mobility Overal bed mobility: Needs Assistance Bed Mobility: Supine to Sit;Sit to Supine     Supine to sit: Min guard;HOB elevated Sit to supine: Min guard;HOB elevated   General bed mobility comments: Use of rail and increased time to get to EOB.  Transfers Overall transfer level: Needs assistance Equipment used: Rolling walker (2 wheeled) Transfers: Sit to/from Stand Sit to Stand: Min assist         General transfer comment: Assist to power to standing with cues for hand placement/technique. Reluctant to place weight through RLE. Limited by pain  Ambulation/Gait              General Gait Details: Deferred due to pain.  Stairs            Wheelchair Mobility    Modified Rankin (Stroke Patients Only)       Balance Overall balance assessment: Needs assistance Sitting-balance support: Feet supported;No upper extremity supported Sitting balance-Leahy Scale: Fair     Standing balance support: During functional activity Standing balance-Leahy Scale: Poor Standing balance comment: Requires UE support and close min guard-Min A ins tanding. Difficulty extending RLE fully, limited by pain and sensitivity.                             Pertinent Vitals/Pain Pain Assessment: Faces Faces Pain Scale: Hurts whole lot Pain Location: RLe with mobility Pain Descriptors / Indicators: Sore;Tender;Grimacing;Guarding Pain Intervention(s): Repositioned;Monitored during session;Limited activity within patient's tolerance;Patient requesting pain meds-RN notified    Home Living Family/patient expects to be discharged to:: Private residence Living Arrangements: Alone Available Help at Discharge: Family;Available PRN/intermittently Type of Home: House Home Access: Stairs to enter Entrance Stairs-Rails: Right Entrance Stairs-Number of Steps: 4 vs 2 Home Layout: One level Home Equipment: Cane - single point;Shower seat;Walker - 2 wheels;Bedside commode      Prior Function Level of Independence: Independent with assistive device(s)         Comments: uses a cane for mobility, independent ADLs/IADLs, +driving      Hand Dominance   Dominant Hand: Right    Extremity/Trunk Assessment   Upper Extremity Assessment Upper Extremity Assessment: Defer to OT evaluation    Lower Extremity Assessment Lower  Extremity Assessment: RLE deficits/detail;LLE deficits/detail RLE Deficits / Details: Limited knee AROM due to pain. RLE Sensation: decreased light touch LLE Sensation: decreased light touch       Communication   Communication: No difficulties   Cognition Arousal/Alertness: Lethargic Behavior During Therapy: WFL for tasks assessed/performed Overall Cognitive Status: Within Functional Limits for tasks assessed                                 General Comments: for basic mobility tasks      General Comments General comments (skin integrity, edema, etc.): VSS. instructed pt in desensitization techniques due to hypersensitivity to light touch    Exercises     Assessment/Plan    PT Assessment Patient needs continued PT services  PT Problem List Decreased strength;Decreased mobility;Pain;Impaired sensation;Decreased balance;Decreased activity tolerance;Decreased skin integrity       PT Treatment Interventions Therapeutic activities;Gait training;Therapeutic exercise;Patient/family education;Balance training;Functional mobility training;Stair training;DME instruction    PT Goals (Current goals can be found in the Care Plan section)  Acute Rehab PT Goals Patient Stated Goal: to go home PT Goal Formulation: With patient Time For Goal Achievement: 04/24/19 Potential to Achieve Goals: Fair    Frequency Min 3X/week   Barriers to discharge Decreased caregiver support lives alone    Co-evaluation               AM-PAC PT "6 Clicks" Mobility  Outcome Measure Help needed turning from your back to your side while in a flat bed without using bedrails?: A Little Help needed moving from lying on your back to sitting on the side of a flat bed without using bedrails?: A Little Help needed moving to and from a bed to a chair (including a wheelchair)?: A Little Help needed standing up from a chair using your arms (e.g., wheelchair or bedside chair)?: A Little Help needed to walk in hospital room?: A Lot Help needed climbing 3-5 steps with a railing? : A Lot 6 Click Score: 16    End of Session Equipment Utilized During Treatment: Gait belt Activity Tolerance: Patient limited by pain;Patient tolerated treatment  well Patient left: in bed;with call bell/phone within reach;with bed alarm set Nurse Communication: Mobility status;Patient requests pain meds PT Visit Diagnosis: Pain;Muscle weakness (generalized) (M62.81);Difficulty in walking, not elsewhere classified (R26.2) Pain - Right/Left: Right Pain - part of body: Leg    Time: 9242-6834 PT Time Calculation (min) (ACUTE ONLY): 31 min   Charges:   PT Evaluation $PT Eval Moderate Complexity: 1 Mod PT Treatments $Therapeutic Activity: 8-22 mins        Marisa Severin, PT, DPT Acute Rehabilitation Services Pager [phone] Office [phone]      Marguarite Arbour A Sabra Heck 04/10/2019, 4:15 PM

## 2019-04-11 LAB — CBC
HCT: 30.5 % — ABNORMAL LOW (ref 39.0–52.0)
Hemoglobin: 9.9 g/dL — ABNORMAL LOW (ref 13.0–17.0)
MCH: 34.3 pg — ABNORMAL HIGH (ref 26.0–34.0)
MCHC: 32.5 g/dL (ref 30.0–36.0)
MCV: 105.5 fL — ABNORMAL HIGH (ref 80.0–100.0)
Platelets: 98 10*3/uL — ABNORMAL LOW (ref 150–400)
RBC: 2.89 MIL/uL — ABNORMAL LOW (ref 4.22–5.81)
RDW: 14.2 % (ref 11.5–15.5)
WBC: 12.2 10*3/uL — ABNORMAL HIGH (ref 4.0–10.5)
nRBC: 0 % (ref 0.0–0.2)

## 2019-04-11 LAB — RENAL FUNCTION PANEL
Albumin: 2.7 g/dL — ABNORMAL LOW (ref 3.5–5.0)
Anion gap: 18 — ABNORMAL HIGH (ref 5–15)
BUN: 52 mg/dL — ABNORMAL HIGH (ref 8–23)
CO2: 25 mmol/L (ref 22–32)
Calcium: 7.9 mg/dL — ABNORMAL LOW (ref 8.9–10.3)
Chloride: 93 mmol/L — ABNORMAL LOW (ref 98–111)
Creatinine, Ser: 7.44 mg/dL — ABNORMAL HIGH (ref 0.61–1.24)
GFR calc Af Amer: 8 mL/min — ABNORMAL LOW (ref >60)
GFR calc non Af Amer: 7 mL/min — ABNORMAL LOW (ref >60)
Glucose, Bld: 122 mg/dL — ABNORMAL HIGH (ref 70–99)
Phosphorus: 6.8 mg/dL — ABNORMAL HIGH (ref 2.5–4.6)
Potassium: 5.5 mmol/L — ABNORMAL HIGH (ref 3.5–5.1)
Sodium: 136 mmol/L (ref 135–145)

## 2019-04-11 MED ORDER — LIDOCAINE HCL (PF) 1 % IJ SOLN: 5.0000 mL | INTRAMUSCULAR | Status: DC | PRN

## 2019-04-11 MED ORDER — HEPARIN SODIUM (PORCINE) 1000 UNIT/ML DIALYSIS
1000.0000 [IU] | INTRAMUSCULAR | Status: DC | PRN
  Administered 2019-04-11: 1000 [IU] via INTRAVENOUS_CENTRAL

## 2019-04-11 MED ORDER — HEPARIN SODIUM (PORCINE) 1000 UNIT/ML IJ SOLN
INTRAMUSCULAR | Status: AC
  Filled 2019-04-11: qty 1

## 2019-04-11 MED ORDER — SODIUM CHLORIDE 0.9 % IV SOLN: 100.0000 mL | INTRAVENOUS | Status: DC | PRN

## 2019-04-11 MED ORDER — PENTAFLUOROPROP-TETRAFLUOROETH EX AERO: 1.0000 "application " | INHALATION_SPRAY | CUTANEOUS | Status: DC | PRN

## 2019-04-11 MED ORDER — ALTEPLASE 2 MG IJ SOLR: 2.0000 mg | Freq: Once | INTRAMUSCULAR | Status: DC | PRN

## 2019-04-11 MED ORDER — LIDOCAINE-PRILOCAINE 2.5-2.5 % EX CREA
1.0000 "application " | TOPICAL_CREAM | CUTANEOUS | Status: DC | PRN
  Filled 2019-04-11: qty 5

## 2019-04-11 NOTE — Progress Notes (Addendum)
  Progress Note    04/11/2019 7:53 AM 1 Day Post-Op  Subjective:  Seen on HD.  No complaints   Vitals:   04/10/19 2300 04/11/19 0421  BP: 112/65 119/60  Pulse: 70 66  Resp: 16 14  Temp:  98 F (36.7 C)  SpO2: 96% 97%   Physical Exam: Lungs:  Non labored Incisions:  Incisions of RLE c/d/i Extremities:  Palpable popliteal pulse Neurologic: a&O  CBC    Component Value Date/Time   WBC 12.2 (H) 04/11/2019 0242   RBC 2.89 (L) 04/11/2019 0242   HGB 9.9 (L) 04/11/2019 0242   HGB 11.4 (L) 11/12/2018 1637   HGB 10.8 (L) 03/14/2017 0921   HCT 30.5 (L) 04/11/2019 0242   HCT 35.0 (L) 11/12/2018 1637   HCT 32.5 (L) 03/14/2017 0921   PLT 98 (L) 04/11/2019 0242   PLT 51 (LL) 11/12/2018 1637   MCV 105.5 (H) 04/11/2019 0242   MCV 103 (H) 11/12/2018 1637   MCV 106.2 (H) 03/14/2017 0921   MCH 34.3 (H) 04/11/2019 0242   MCHC 32.5 04/11/2019 0242   RDW 14.2 04/11/2019 0242   RDW 14.6 11/12/2018 1637   RDW 14.6 03/14/2017 0921   LYMPHSABS 1.0 11/12/2018 1637   LYMPHSABS 0.8 (L) 03/14/2017 0921   MONOABS 0.4 10/20/2018 0638   MONOABS 0.6 03/14/2017 0921   EOSABS 0.2 11/12/2018 1637   BASOSABS 0.0 11/12/2018 1637   BASOSABS 0.0 03/14/2017 0921    BMET    Component Value Date/Time   NA 136 04/11/2019 0242   NA 135 07/14/2018 0909   NA 141 03/14/2017 0921   K 5.5 (H) 04/11/2019 0242   K 4.1 03/14/2017 0921   CL 93 (L) 04/11/2019 0242   CO2 25 04/11/2019 0242   CO2 33 (H) 03/14/2017 0921   GLUCOSE 122 (H) 04/11/2019 0242   GLUCOSE 144 (H) 03/14/2017 0921   BUN 52 (H) 04/11/2019 0242   BUN 76 (HH) 07/14/2018 0909   BUN 30.1 (H) 03/14/2017 0921   CREATININE 7.44 (H) 04/11/2019 0242   CREATININE 9.16 (HH) 09/17/2017 0954   CREATININE 8.1 (HH) 03/14/2017 0921   CALCIUM 7.9 (L) 04/11/2019 0242   CALCIUM 9.5 03/14/2017 0921   GFRNONAA 7 (L) 04/11/2019 0242   GFRNONAA 5 (L) 09/17/2017 0954   GFRNONAA 24 (L) 06/30/2014 1528   GFRAA 8 (L) 04/11/2019 0242   GFRAA 6 (L)  09/17/2017 0954   GFRAA 28 (L) 06/30/2014 1528    INR    Component Value Date/Time   INR 1.3 (H) 10/16/2018 1045   INR 1.10 (L) 01/21/2015 0809     Intake/Output Summary (Last 24 hours) at 04/11/2019 0753 Last data filed at 04/10/2019 1116 Gross per 24 hour  Intake 700 ml  Output 75 ml  Net 625 ml     Assessment/Plan:  69 y.o. male is s/p R femoral to BK popliteal bypass with vein 1 Day Post-Op   RLE well perfused OOB today PT/OT recommending SNF; case manager consulted Continue to monitor tissue changes of R foot ESRD on HD per Nephrology D/c when placement arranged   Dagoberto Ligas, PA-C [facility] [phone] 04/11/2019 7:53 AM   I have interviewed and examined patient with PA and agree with assessment and plan above.   Lyana Asbill C. Donzetta Matters, MD [facility] [place] Office: [phone] Pager: [phone]

## 2019-04-11 NOTE — Progress Notes (Signed)
[facility] Progress Note   Dialysis Orders: MWF - [facility]  4hrs, BFR 400, DFR 800,  EDW 79kg, 2K/ 2Ca  Access: RU AVF +b  Heparin 2400 Mircera 30 mcg q2wks - last 04/01/19 Calcitriol 1.36mcg PO qHD Parsabiv 7.5mg  qHD - not on hospital formulary   Assessment/Plan: 1.  PAD w/critical RLE ischemia and rest pain - s/p  right common femoral to below-knee popliteal artery 1/29 foot still cool, dusky 2.  ESRD -  MWF.  HD yesterday and then Saturday K 5.6 yesterday post op  - given lokelma; - K 5.5 today - 4 hr HD today to get K down  3.  Hypertension/volume  - Blood pressure better - net UF 2.5 Thursday  - post wt 78.1 - actually on low dose BB and midodrine for BP support - goal 2 L BP down during tmt  4.  Anemia of CKD - Hgb 11.9 > 10.9 > 9.9 - continue to trend. ESA  given 1/20- redose next week 5.  Secondary Hyperparathyroidism -  P 6.8  Continue binders- on 3 Aurixya, and VDRA. 6.  Nutrition - Renal diet w/fluid restrictions 7. Thrombocytopenia - plt 90s - 100 8. Severe AS s/p TAVR 9. NSVT - on amiodarone 10. Ischemic CM - per primary  Myriam Jacobson, PA-C [place] [phone] 04/11/2019,9:21 AM  LOS: 2 days   Subjective:   Seen on HD Doesn't recall my visit yesterday when seen post op - tolerating pain.  Objective Vitals:   04/11/19 0716 04/11/19 0730 04/11/19 0800 04/11/19 0830  BP: (!) 141/53 (!) 147/43 122/60 (!) 103/53  Pulse: 71 71 70 72  Resp:      Temp:      TempSrc:      SpO2:      Weight:      Height:       Physical Exam goal 2 L General: NAD on HD breathing easily Heart: RRR Lungs: no rales Abdomen: soft NT Extremities: no LE edema, right LE incisions look good - foot warm Dialysis Access: right upper AVF Qb 400   Additional Objective Labs: Basic Metabolic Panel: Recent Labs  Lab 04/10/19 0348 04/10/19 1400 04/11/19 0242  NA 141 140 136  K 5.1 5.6* 5.5*  CL 97* 98 93*  CO2 29 23 25   GLUCOSE 93 122* 122*   BUN 26* 37* 52*  CREATININE 5.45* 6.39* 7.44*  CALCIUM 8.2* 8.1* 7.9*  PHOS  --   --  6.8*   Liver Function Tests: Recent Labs  Lab 04/11/19 0242  ALBUMIN 2.7*   No results for input(s): LIPASE, AMYLASE in the last 168 hours. CBC: Recent Labs  Lab 04/09/19 1948 04/10/19 0348 04/11/19 0242  WBC 11.3* 8.4 12.2*  HGB 11.3* 10.9* 9.9*  HCT 35.0* 34.6* 30.5*  MCV 106.7* 108.5* 105.5*  PLT 102* 98* 98*   Blood Culture    Component Value Date/Time   SDES URINE, RANDOM 02/01/2016 1707   SPECREQUEST NONE 02/01/2016 1707   CULT (A) 02/01/2016 1707    10,000 COLONIES/mL PSEUDOMONAS AERUGINOSA 40,000 COLONIES/mL ENTEROBACTER AEROGENES    REPTSTATUS 02/04/2016 FINAL 02/01/2016 1707    Cardiac Enzymes: No results for input(s): CKTOTAL, CKMB, CKMBINDEX, TROPONINI in the last 168 hours. CBG: No results for input(s): GLUCAP in the last 168 hours. Iron Studies: No results for input(s): IRON, TIBC, TRANSFERRIN, FERRITIN in the last 72 hours. Lab Results  Component Value Date   INR 1.3 (H) 10/16/2018   INR 1.2 07/22/2018  INR 1.27 01/23/2016   PROTIME 13.2 01/21/2015   Studies/Results: No results found. Medications: . sodium chloride    . sodium chloride    . sodium chloride    . sodium chloride 50 mL/hr at 04/10/19 1321  . magnesium sulfate bolus IVPB     . amiodarone  200 mg Oral Daily  . atorvastatin  80 mg Oral Q supper  . calcitRIOL  1.5 mcg Oral Q M,W,F-HD  . calcitRIOL  1.5 mcg Oral Once in dialysis  . carvedilol  3.125 mg Oral BID  . Chlorhexidine Gluconate Cloth  6 each Topical Q0600  . clopidogrel  75 mg Oral Q supper  . docusate sodium  100 mg Oral Daily  . ferric citrate  630 mg Oral TID WC  . heparin      . heparin  5,000 Units Subcutaneous Q8H  . midodrine  10 mg Oral Q M,W,F-HD  . multivitamin  1 tablet Oral QHS  . pantoprazole  40 mg Oral Daily  . vitamin B-12  1,000 mcg Oral QPM

## 2019-04-12 LAB — GLUCOSE, CAPILLARY: Glucose-Capillary: 123 mg/dL — ABNORMAL HIGH (ref 70–99)

## 2019-04-12 NOTE — Progress Notes (Addendum)
  Progress Note    04/12/2019 8:01 AM 2 Days Post-Op  Subjective:  R foot feels better than before surgery   Vitals:   04/12/19 0405 04/12/19 0728  BP: (!) 124/56 (!) 132/55  Pulse: 87 81  Resp: 18 17  Temp: 98.5 F (36.9 C) 98.3 F (36.8 C)  SpO2: 91% 95%   Physical Exam: Lungs:  Non labored Incisions:  RLE incisions c/d/i Extremities:  R DP and PT by doppler Abdomen:  Soft Neurologic: A&O  CBC    Component Value Date/Time   WBC 12.2 (H) 04/11/2019 0242   RBC 2.89 (L) 04/11/2019 0242   HGB 9.9 (L) 04/11/2019 0242   HGB 11.4 (L) 11/12/2018 1637   HGB 10.8 (L) 03/14/2017 0921   HCT 30.5 (L) 04/11/2019 0242   HCT 35.0 (L) 11/12/2018 1637   HCT 32.5 (L) 03/14/2017 0921   PLT 98 (L) 04/11/2019 0242   PLT 51 (LL) 11/12/2018 1637   MCV 105.5 (H) 04/11/2019 0242   MCV 103 (H) 11/12/2018 1637   MCV 106.2 (H) 03/14/2017 0921   MCH 34.3 (H) 04/11/2019 0242   MCHC 32.5 04/11/2019 0242   RDW 14.2 04/11/2019 0242   RDW 14.6 11/12/2018 1637   RDW 14.6 03/14/2017 0921   LYMPHSABS 1.0 11/12/2018 1637   LYMPHSABS 0.8 (L) 03/14/2017 0921   MONOABS 0.4 10/20/2018 0638   MONOABS 0.6 03/14/2017 0921   EOSABS 0.2 11/12/2018 1637   BASOSABS 0.0 11/12/2018 1637   BASOSABS 0.0 03/14/2017 0921    BMET    Component Value Date/Time   NA 136 04/11/2019 0242   NA 135 07/14/2018 0909   NA 141 03/14/2017 0921   K 5.5 (H) 04/11/2019 0242   K 4.1 03/14/2017 0921   CL 93 (L) 04/11/2019 0242   CO2 25 04/11/2019 0242   CO2 33 (H) 03/14/2017 0921   GLUCOSE 122 (H) 04/11/2019 0242   GLUCOSE 144 (H) 03/14/2017 0921   BUN 52 (H) 04/11/2019 0242   BUN 76 (HH) 07/14/2018 0909   BUN 30.1 (H) 03/14/2017 0921   CREATININE 7.44 (H) 04/11/2019 0242   CREATININE 9.16 (HH) 09/17/2017 0954   CREATININE 8.1 (HH) 03/14/2017 0921   CALCIUM 7.9 (L) 04/11/2019 0242   CALCIUM 9.5 03/14/2017 0921   GFRNONAA 7 (L) 04/11/2019 0242   GFRNONAA 5 (L) 09/17/2017 0954   GFRNONAA 24 (L) 06/30/2014 1528    GFRAA 8 (L) 04/11/2019 0242   GFRAA 6 (L) 09/17/2017 0954   GFRAA 28 (L) 06/30/2014 1528    INR    Component Value Date/Time   INR 1.3 (H) 10/16/2018 1045   INR 1.10 (L) 01/21/2015 0809     Intake/Output Summary (Last 24 hours) at 04/12/2019 0801 Last data filed at 04/11/2019 1509 Gross per 24 hour  Intake 220 ml  Output 2000 ml  Net -1780 ml     Assessment/Plan:  69 y.o. male is s/p R femoral to BK pop bypass with vein 2 Days Post-Op   R foot well perfused with pedal signals by doppler Patient not agreeable to SNF; will ask PT to re-evaluate for [facility] Home when mobility improved and HH PT arranged   Dagoberto Ligas, PA-C [facility] [phone] 04/12/2019 8:01 AM  I have independently interviewed and examined patient and agree with PA assessment and plan above.   Swayze Pries C. Donzetta Matters, MD [facility] [place] Office: [phone] Pager: [phone]

## 2019-04-12 NOTE — Evaluation (Signed)
Occupational Therapy Evaluation Patient Details Name: Jose Garrett MRN: [id number] DOB: 10-Apr-1950 Today's Date: 04/12/2019    History of Present Illness Patient is a 69 y/o male who presents with RLE ischemia now s/p Right femoral to below-knee popliteal bypass with translocated nonreversed great saphenous vein 1/29. PMh includes aortic stenosis, HTN, HLD, CHF, CAD, PAD, anxiety, ESRD on HD,   Clinical Impression   Pt was admitted for the above sx.  At baseline, he is mod I for basic adls and most IADLs. Daughter gets him groceries and transports him to dialysis.  He plans to stay with sister in law upon d/c.  Pt needed mod A to stand with assist to weight shift forward for balance; decreased standing tolerance today due to pain. Will follow in acute setting with min guard to min A level goals    Follow Up Recommendations  Home health OT;Supervision/Assistance - 24 hour(pt declining snf)    Equipment Recommendations  None recommended by OT    Recommendations for Other Services       Precautions / Restrictions Precautions Precautions: Fall Restrictions Weight Bearing Restrictions: No      Mobility Bed Mobility           Sit to supine: Min assist   General bed mobility comments: for RLE  Transfers   Equipment used: Rolling walker (2 wheeled)   Sit to Stand: Mod assist         General transfer comment: to power up and stabilize. Pt with posterior bias, requiring assistance to wt shift forward for balance    Balance             Standing balance-Leahy Scale: Poor                             ADL either performed or assessed with clinical judgement   ADL Overall ADL's : Needs assistance/impaired Eating/Feeding: Independent   Grooming: Set up   Upper Body Bathing: Set up   Lower Body Bathing: Moderate assistance   Upper Body Dressing : Set up   Lower Body Dressing: Maximal assistance                 General ADL Comments: only  stood and pt could not tolerate more.  Mod A to stand and pt has posterior bias, requiring support. Talked to him about AE. Pt is unable to lift RLE on his own nor can he tolerate crossing LLE under it.  May need gaitbelt under calf for bed mobiilty     Vision         Perception     Praxis      Pertinent Vitals/Pain Faces Pain Scale: Hurts worst Pain Location: RLe with mobility Pain Descriptors / Indicators: Sore;Tender;Grimacing;Guarding Pain Intervention(s): Limited activity within patient's tolerance;Monitored during session;Premedicated before session;Repositioned     Hand Dominance     Extremity/Trunk Assessment Upper Extremity Assessment Upper Extremity Assessment: Generalized weakness(grossly 4-/5)   Lower Extremity Assessment RLE Deficits / Details: foot red; gauze between toes       Communication Communication Communication: No difficulties   Cognition Arousal/Alertness: Awake/alert Behavior During Therapy: WFL for tasks assessed/performed Overall Cognitive Status: Within Functional Limits for tasks assessed                                     General Comments  Exercises     Shoulder Instructions      Home Living Family/patient expects to be discharged to:: Private residence Living Arrangements: Alone Available Help at Discharge: Family;Available PRN/intermittently               Bathroom Shower/Tub: Tub/shower unit   Bathroom Toilet: Standard     Home Equipment: Cane - single point;Walker - 2 wheels;Bedside commode;Tub bench          Prior Functioning/Environment Level of Independence: Independent with assistive device(s)        Comments: uses a cane for mobility, independent ADLs/IADLs, daughter drives him to dialysis and gets groceries. Plans to stay with sister in law initially        OT Problem List: Decreased strength;Decreased activity tolerance;Impaired balance (sitting and/or standing);Pain;Decreased  knowledge of use of DME or AE;Decreased knowledge of precautions      OT Treatment/Interventions: Self-care/ADL training;Therapeutic exercise;Energy conservation;DME and/or AE instruction;Balance training;Patient/family education;Therapeutic activities    OT Goals(Current goals can be found in the care plan section) Acute Rehab OT Goals Patient Stated Goal: to go home OT Goal Formulation: With patient Time For Goal Achievement: 04/26/19 Potential to Achieve Goals: Good ADL Goals Pt Will Perform Grooming: with min guard assist;standing Pt Will Transfer to Toilet: with min guard assist;stand pivot transfer;ambulating Pt Will Perform Toileting - Clothing Manipulation and hygiene: with min guard assist;sit to/from stand Additional ADL Goal #1: pt will perform adl with min A and AE as needed Additional ADL Goal #2: pt will perform bed mob at min guard level, using strap for RLE as needed  OT Frequency: Min 2X/week   Barriers to D/C:            Co-evaluation              AM-PAC OT "6 Clicks" Daily Activity     Outcome Measure Help from another person eating meals?: None Help from another person taking care of personal grooming?: A Little Help from another person toileting, which includes using toliet, bedpan, or urinal?: A Lot Help from another person bathing (including washing, rinsing, drying)?: A Lot Help from another person to put on and taking off regular upper body clothing?: A Little Help from another person to put on and taking off regular lower body clothing?: A Lot 6 Click Score: 16   End of Session    Activity Tolerance: Patient limited by pain Patient left: in bed;with call bell/phone within reach  OT Visit Diagnosis: Unsteadiness on feet (R26.81);Pain;Muscle weakness (generalized) (M62.81) Pain - Right/Left: Right Pain - part of body: Leg                Time: 2774-1287 OT Time Calculation (min): 30 min Charges:  OT General Charges $OT Visit: 1 Visit OT  Evaluation $OT Eval Moderate Complexity: 1 Mod OT Treatments $Self Care/Home Management : 8-22 mins  [name] S, OTR/L Acute Rehabilitation Services 04/12/2019  [name] 04/12/2019, 3:49 PM

## 2019-04-12 NOTE — Progress Notes (Signed)
Gauze placed between toes on right foot, as ordered.

## 2019-04-12 NOTE — TOC Progression Note (Signed)
Transition of Care [facility]) - Progression Note    Patient Details  Name: Jose Garrett MRN: [id number] Date of Birth: 06-Nov-1950  Transition of Care [facility]) CM/SW Contact  Claudie Leach, RN 04/12/2019, 3:25 PM  Clinical Narrative:    Advised by CSW that patient refused SNF.  Patient to go home with [facility] PT/OT/HHA.  Patient reports he used [place] previously and would like to use again.  Referral accepted by [facility] with [facility].    Patient to return to sister-in-law's home, [address] [place].    Patient has WC, 3n1, shower chair, walker.    Barriers to Discharge: No Barriers Identified  Expected Discharge Plan and Services    HH Arranged: PT, OT, Nurse's Aide [place] Agency: [place] ([facility]) Date [place]: 04/12/19 Time [place]: 1515 Representative spoke with at [place]: [place] ([place]) Interventions    Readmission Risk Interventions Readmission Risk Prevention Plan 10/23/2018 08/20/2018 08/20/2018  Transportation Screening Complete - Complete  PCP or Specialist Appt within 3-5 Days Complete - -  HRI or Home Care Consult Complete - -  Social Work Consult for [place] Planning/Counseling Complete - -  Palliative Care Screening Not Applicable - -  Medication Review Press photographer) Complete - Complete  PCP or Specialist appointment within 3-5 days of discharge - Complete -  [place] or [place] - Complete -  SW Recovery Care/Counseling Consult - Complete -  Palliative Care Screening - Not Applicable -  [place] - Not Applicable -  Some recent data might be hidden

## 2019-04-12 NOTE — Progress Notes (Addendum)
[facility] Progress Note   Dialysis Orders: MWF - [facility]  4hrs, BFR 400, DFR 800,  EDW 79kg, 2K/ 2Ca  Access: RU AVF +b  Heparin 2400 Mircera 30 mcg q2wks - last 04/01/19 Calcitriol 1.29mcg PO qHD Parsabiv 7.5mg  qHD - not on hospital formulary   Assessment/Plan: 1.  PAD w/critical RLE ischemia and rest pain - s/p  right common femoral to below-knee popliteal artery 1/29 foot still somewhat dusky 2.  ESRD -  MWF.  last HD Saturday -  Ran 3.75 hr to try to get K down - back on schedule Monday  3.  Hypertension/volume  - Blood pressure better - net UF 2.5 Thursday  - post wt 78.1 - actually on low dose BB and midodrine for BP support -net UF 2 L Saturday with post wt  76.4 - try to get standing wt Monday - likely will how lower new EDW 4.  Anemia of CKD - Hgb 11.9 > 10.9 > 9.9 - continue to trend. ESA  given 1/20- redose next week 5.  Secondary Hyperparathyroidism -  P 6.8  Continue binders- on 3 Aurixya, and VDRA. 6.  Nutrition - Renal diet w/fluid restrictions 7. Thrombocytopenia - plt 90s - 100 8. Severe AS s/p TAVR 9. NSVT - on amiodarone 10. Ischemic CM - per primary 11. Disp - declines SNF - plan home with [place], PA-C [place] [phone] 04/12/2019,11:29 AM  LOS: 3 days   Subjective:   Doing well today. Still with some pain. Pulses dopplerable per VVS note  Objective Vitals:   04/12/19 0035 04/12/19 0405 04/12/19 0728 04/12/19 0800  BP: (!) 133/55 (!) 124/56 (!) 132/55   Pulse: 86 87 81 92  Resp: 17 18 17 20   Temp: 98.4 F (36.9 C) 98.5 F (36.9 C) 98.3 F (36.8 C)   TempSrc: Oral Oral Oral   SpO2: 97% 91% 95%   Weight:      Height:       Physical Exam  General: NAD on HD breathing easily Heart: RRR Lungs: no rales Abdomen: soft NT Extremities: no LE edema, right LE incisions look good - foot warm Dialysis Access: right upper AVF + bruit   Additional Objective Labs: Basic Metabolic Panel: Recent  Labs  Lab 04/10/19 0348 04/10/19 1400 04/11/19 0242  NA 141 140 136  K 5.1 5.6* 5.5*  CL 97* 98 93*  CO2 29 23 25   GLUCOSE 93 122* 122*  BUN 26* 37* 52*  CREATININE 5.45* 6.39* 7.44*  CALCIUM 8.2* 8.1* 7.9*  PHOS  --   --  6.8*   Liver Function Tests: Recent Labs  Lab 04/11/19 0242  ALBUMIN 2.7*   No results for input(s): LIPASE, AMYLASE in the last 168 hours. CBC: Recent Labs  Lab 04/09/19 1948 04/10/19 0348 04/11/19 0242  WBC 11.3* 8.4 12.2*  HGB 11.3* 10.9* 9.9*  HCT 35.0* 34.6* 30.5*  MCV 106.7* 108.5* 105.5*  PLT 102* 98* 98*   Blood Culture    Component Value Date/Time   SDES URINE, RANDOM 02/01/2016 1707   SPECREQUEST NONE 02/01/2016 1707   CULT (A) 02/01/2016 1707    10,000 COLONIES/mL PSEUDOMONAS AERUGINOSA 40,000 COLONIES/mL ENTEROBACTER AEROGENES    REPTSTATUS 02/04/2016 FINAL 02/01/2016 1707    Cardiac Enzymes: No results for input(s): CKTOTAL, CKMB, CKMBINDEX, TROPONINI in the last 168 hours. CBG: Recent Labs  Lab 04/12/19 0034  GLUCAP 123*   Iron Studies: No results for input(s): IRON, TIBC, TRANSFERRIN,  FERRITIN in the last 72 hours. Lab Results  Component Value Date   INR 1.3 (H) 10/16/2018   INR 1.2 07/22/2018   INR 1.27 01/23/2016   PROTIME 13.2 01/21/2015   Studies/Results: No results found. Medications: . sodium chloride    . sodium chloride    . sodium chloride    . sodium chloride 50 mL/hr at 04/10/19 1321  . magnesium sulfate bolus IVPB     . amiodarone  200 mg Oral Daily  . atorvastatin  80 mg Oral Q supper  . calcitRIOL  1.5 mcg Oral Q M,W,F-HD  . calcitRIOL  1.5 mcg Oral Once in dialysis  . carvedilol  3.125 mg Oral BID  . Chlorhexidine Gluconate Cloth  6 each Topical Q0600  . clopidogrel  75 mg Oral Q supper  . docusate sodium  100 mg Oral Daily  . ferric citrate  630 mg Oral TID WC  . heparin  5,000 Units Subcutaneous Q8H  . midodrine  10 mg Oral Q M,W,F-HD  . multivitamin  1 tablet Oral QHS  . pantoprazole   40 mg Oral Daily  . vitamin B-12  1,000 mcg Oral QPM

## 2019-04-13 ENCOUNTER — Encounter (HOSPITAL_COMMUNITY): Payer: Medicare Other

## 2019-04-13 LAB — CBC
HCT: 31 % — ABNORMAL LOW (ref 39.0–52.0)
Hemoglobin: 10 g/dL — ABNORMAL LOW (ref 13.0–17.0)
MCH: 34.2 pg — ABNORMAL HIGH (ref 26.0–34.0)
MCHC: 32.3 g/dL (ref 30.0–36.0)
MCV: 106.2 fL — ABNORMAL HIGH (ref 80.0–100.0)
Platelets: 115 10*3/uL — ABNORMAL LOW (ref 150–400)
RBC: 2.92 MIL/uL — ABNORMAL LOW (ref 4.22–5.81)
RDW: 14.4 % (ref 11.5–15.5)
WBC: 9.9 10*3/uL (ref 4.0–10.5)
nRBC: 0 % (ref 0.0–0.2)

## 2019-04-13 LAB — RENAL FUNCTION PANEL
Albumin: 2.8 g/dL — ABNORMAL LOW (ref 3.5–5.0)
Anion gap: 19 — ABNORMAL HIGH (ref 5–15)
BUN: 67 mg/dL — ABNORMAL HIGH (ref 8–23)
CO2: 24 mmol/L (ref 22–32)
Calcium: 8.3 mg/dL — ABNORMAL LOW (ref 8.9–10.3)
Chloride: 96 mmol/L — ABNORMAL LOW (ref 98–111)
Creatinine, Ser: 8.96 mg/dL — ABNORMAL HIGH (ref 0.61–1.24)
GFR calc Af Amer: 6 mL/min — ABNORMAL LOW (ref >60)
GFR calc non Af Amer: 6 mL/min — ABNORMAL LOW (ref >60)
Glucose, Bld: 109 mg/dL — ABNORMAL HIGH (ref 70–99)
Phosphorus: 7 mg/dL — ABNORMAL HIGH (ref 2.5–4.6)
Potassium: 4.8 mmol/L (ref 3.5–5.1)
Sodium: 139 mmol/L (ref 135–145)

## 2019-04-13 MED ORDER — CALCITRIOL 0.5 MCG PO CAPS
ORAL_CAPSULE | ORAL | Status: AC
  Filled 2019-04-13: qty 3

## 2019-04-13 MED ORDER — MIDODRINE HCL 5 MG PO TABS
ORAL_TABLET | ORAL | Status: AC
  Filled 2019-04-13: qty 2

## 2019-04-13 NOTE — Progress Notes (Addendum)
Progress Note    04/13/2019 7:19 AM 3 Days Post-Op  Subjective:  Still having trouble increasing mobility.  He believes he will need another day in the hospital working with therapy teams.   Vitals:   04/12/19 1958 04/13/19 0031  BP: (!) 126/53 (!) 136/56  Pulse: 78 80  Resp: 18   Temp: 98.1 F (36.7 C) 98.5 F (36.9 C)  SpO2: 94% 98%   Physical Exam: Lungs:  Non labored Incisions: R groin and RLE incisions c/d/i Extremities:  PT and AT signals by doppler; toes of R foot dusky; gauze between toes Neurologic: A&O  CBC    Component Value Date/Time   WBC 12.2 (H) 04/11/2019 0242   RBC 2.89 (L) 04/11/2019 0242   HGB 9.9 (L) 04/11/2019 0242   HGB 11.4 (L) 11/12/2018 1637   HGB 10.8 (L) 03/14/2017 0921   HCT 30.5 (L) 04/11/2019 0242   HCT 35.0 (L) 11/12/2018 1637   HCT 32.5 (L) 03/14/2017 0921   PLT 98 (L) 04/11/2019 0242   PLT 51 (LL) 11/12/2018 1637   MCV 105.5 (H) 04/11/2019 0242   MCV 103 (H) 11/12/2018 1637   MCV 106.2 (H) 03/14/2017 0921   MCH 34.3 (H) 04/11/2019 0242   MCHC 32.5 04/11/2019 0242   RDW 14.2 04/11/2019 0242   RDW 14.6 11/12/2018 1637   RDW 14.6 03/14/2017 0921   LYMPHSABS 1.0 11/12/2018 1637   LYMPHSABS 0.8 (L) 03/14/2017 0921   MONOABS 0.4 10/20/2018 0638   MONOABS 0.6 03/14/2017 0921   EOSABS 0.2 11/12/2018 1637   BASOSABS 0.0 11/12/2018 1637   BASOSABS 0.0 03/14/2017 0921    BMET    Component Value Date/Time   NA 136 04/11/2019 0242   NA 135 07/14/2018 0909   NA 141 03/14/2017 0921   K 5.5 (H) 04/11/2019 0242   K 4.1 03/14/2017 0921   CL 93 (L) 04/11/2019 0242   CO2 25 04/11/2019 0242   CO2 33 (H) 03/14/2017 0921   GLUCOSE 122 (H) 04/11/2019 0242   GLUCOSE 144 (H) 03/14/2017 0921   BUN 52 (H) 04/11/2019 0242   BUN 76 (HH) 07/14/2018 0909   BUN 30.1 (H) 03/14/2017 0921   CREATININE 7.44 (H) 04/11/2019 0242   CREATININE 9.16 (HH) 09/17/2017 0954   CREATININE 8.1 (HH) 03/14/2017 0921   CALCIUM 7.9 (L) 04/11/2019 0242   CALCIUM 9.5 03/14/2017 0921   GFRNONAA 7 (L) 04/11/2019 0242   GFRNONAA 5 (L) 09/17/2017 0954   GFRNONAA 24 (L) 06/30/2014 1528   GFRAA 8 (L) 04/11/2019 0242   GFRAA 6 (L) 09/17/2017 0954   GFRAA 28 (L) 06/30/2014 1528    INR    Component Value Date/Time   INR 1.3 (H) 10/16/2018 1045   INR 1.10 (L) 01/21/2015 0809     Intake/Output Summary (Last 24 hours) at 04/13/2019 0719 Last data filed at 04/12/2019 0858 Gross per 24 hour  Intake 240 ml  Output --  Net 240 ml     Assessment/Plan:  69 y.o. male is s/p R femoral to BK pop bypass with vein 3 Days Post-Op   R foot well perfused with pedal doppler signals OOB with nursing staff and therapy teams ESRD on HD on MWF schedule per Nephrology Home when mobility improved  Dagoberto Ligas, PA-C [facility] [phone] 04/13/2019 7:19 AM  I have independently interviewed and examined patient and agree with PA assessment and plan above.  He is dialyzed this morning is feeling quite weak at this time.  We  will plan for PT evaluation tomorrow and possible discharge.  Hiep Ollis C. Donzetta Matters, MD [facility] [place] Office: [phone] Pager: [phone]

## 2019-04-13 NOTE — Progress Notes (Signed)
PT Cancellation Note  Patient Details Name: Jose Garrett MRN: [id number] DOB: March 02, 1951   Cancelled Treatment:    Reason Eval/Treat Not Completed: Patient at procedure or test/unavailable Pt off floor at HD. Will follow.   Marguarite Arbour A Destine Ambroise 04/13/2019, 12:04 PM Marisa Severin, PT, DPT Acute Rehabilitation Services Pager [phone] Office [phone]

## 2019-04-13 NOTE — Progress Notes (Signed)
Occupational Therapy Treatment Patient Details Name: Jose Garrett MRN: [id number] DOB: 1950-05-06 Today's Date: 04/13/2019    History of present illness Patient is a 70 y/o male who presents with RLE ischemia now s/p Right femoral to below-knee popliteal bypass with translocated nonreversed great saphenous vein 1/29. PMh includes aortic stenosis, HTN, HLD, CHF, CAD, PAD, anxiety, ESRD on HD,   OT comments  Pt making graudal progress towards OT goals this session. Pt much more fatigued and lethargic during session likely d/t just having come back from HD. Pt only following commands 50 % of the time needing increased time to complete bed mobility ultimately needing MOD A +2 to come to EOB. Pt noted to lean to L sitting EOB needing BUE support and MIN A to maintain upright posture. Pt required MOD A +2 sit<>stand from EOB with RW unable to fully extend BUEs and WB on RLE. Pt only able to stand a couple seconds before needing to sit. Pt returned to supine with MAX A +2. May need to update DC recs to SNF based on pts CLOF as pt agrees he can not go to sister-in-laws house at this level of function. Will continue to follow acutely and update DC recs as needed.    Follow Up Recommendations  Home health OT;Supervision/Assistance - 24 hour    Equipment Recommendations  None recommended by OT    Recommendations for Other Services      Precautions / Restrictions Precautions Precautions: Fall Restrictions Weight Bearing Restrictions: No       Mobility Bed Mobility Overal bed mobility: Needs Assistance Bed Mobility: Rolling;Sidelying to Sit;Sit to Supine Rolling: Mod assist Sidelying to sit: Mod assist;HOB elevated   Sit to supine: Max assist;+2 for physical assistance   General bed mobility comments: Able to move LLE to EOB, assist with RLE and scooting botttom to EOB as well as elevating trunk to get upright. Favoring left lateral lean today. Increased time/cues to get upright. Assist to  bring LEs into supine and lower trunk to get into bed.  Transfers Overall transfer level: Needs assistance Equipment used: Rolling walker (2 wheeled) Transfers: Sit to/from Stand Sit to Stand: Mod assist;+2 physical assistance;From elevated surface         General transfer comment: Heavy Mod A of 2 to power to standing with cues for foot/hand placement, and use of momentum. Posterior bias. Reluctant to place weight through RLE, assist with hip extension and upright.    Balance Overall balance assessment: Needs assistance Sitting-balance support: Feet supported;Single extremity supported Sitting balance-Leahy Scale: Poor Sitting balance - Comments: Leaning on left forearm for most of session, increased time/assist to get to upright/midline requiring BUEs support to maintain balance. Overall MIN A to maintain upright posture Postural control: Left lateral lean Standing balance support: During functional activity Standing balance-Leahy Scale: Zero Standing balance comment: Requires BUE support and external support for standing balance; posterior bias. Difficulty extending RLE and placing weight through it.                           ADL either performed or assessed with clinical judgement   ADL Overall ADL's : Needs assistance/impaired                         Toilet Transfer: Moderate assistance;+2 for safety/equipment Toilet Transfer Details (indicate cue type and reason): only able to sit>stand from EOB with MOD A +2  Functional mobility during ADLs: Moderate assistance;+2 for physical assistance;Rolling walker(sit>stand only) General ADL Comments: pt limited by pain, and fatigue this session only able to complete  bed mobility and sit<>stand with RW.     Vision Baseline Vision/History: Wears glasses Wears Glasses: At all times Patient Visual Report: No change from baseline     Perception     Praxis      Cognition Arousal/Alertness:  Lethargic Behavior During Therapy: Flat affect Overall Cognitive Status: Impaired/Different from baseline Area of Impairment: Following commands;Problem solving;Attention;Orientation                 Orientation Level: Disoriented to;Time Current Attention Level: Focused   Following Commands: Follows one step commands with increased time;Follows one step commands inconsistently     Problem Solving: Slow processing;Decreased initiation;Difficulty sequencing;Requires verbal cues;Requires tactile cues General Comments: "It is still February" despite it being the first day of Feb today. When asked the year x2, pt states "yea." Stating comments unrelated to questions asked. Following 1 step commands with increased time, repetition and multimodal cues. Very lethargic and sleepy post dialysis.        Exercises General Exercises - Lower Extremity Ankle Circles/Pumps: Both;10 reps;Supine Quad Sets: 5 reps;Both;Supine Gluteal Sets: 15 reps;Supine;Both   Shoulder Instructions       General Comments VSS    Pertinent Vitals/ Pain       Pain Assessment: Faces Faces Pain Scale: Hurts even more Pain Location: RLe with mobility Pain Descriptors / Indicators: Guarding;Sore;Discomfort;Grimacing Pain Intervention(s): Monitored during session;Repositioned;Patient requesting pain meds-RN notified;Limited activity within patient's tolerance  Home Living                                          Prior Functioning/Environment              Frequency  Min 2X/week        Progress Toward Goals  OT Goals(current goals can now be found in the care plan section)  Progress towards OT goals: Progressing toward goals  Acute Rehab OT Goals Patient Stated Goal: to go home OT Goal Formulation: With patient Time For Goal Achievement: 04/26/19 Potential to Achieve Goals: Good  Plan Discharge plan needs to be updated    Co-evaluation      Reason for Co-Treatment:  Necessary to address cognition/behavior during functional activity;For patient/therapist safety;To address functional/ADL transfers PT goals addressed during session: Mobility/safety with mobility;Balance        AM-PAC OT "6 Clicks" Daily Activity     Outcome Measure   Help from another person eating meals?: None Help from another person taking care of personal grooming?: A Lot Help from another person toileting, which includes using toliet, bedpan, or urinal?: A Lot Help from another person bathing (including washing, rinsing, drying)?: A Lot Help from another person to put on and taking off regular upper body clothing?: A Lot Help from another person to put on and taking off regular lower body clothing?: Total 6 Click Score: 13    End of Session Equipment Utilized During Treatment: Gait belt;Rolling walker  OT Visit Diagnosis: Unsteadiness on feet (R26.81);Pain;Muscle weakness (generalized) (M62.81) Pain - Right/Left: Right Pain - part of body: Leg   Activity Tolerance Patient limited by fatigue;Patient limited by lethargy   Patient Left in bed;with call bell/phone within reach   Nurse Communication Mobility status;Other (comment)(requesting pain meds, unable to get to chair  d/t lethargy and fatigue)        Time: 5075-7322 OT Time Calculation (min): 29 min  Charges: OT General Charges $OT Visit: 1 Visit OT Treatments $Therapeutic Activity: 8-22 mins  Lanier Clam., COTA/L Acute Rehabilitation Services [phone] [place] 04/13/2019, 4:02 PM

## 2019-04-13 NOTE — Progress Notes (Signed)
Physical Therapy Treatment Patient Details Name: Jose Garrett MRN: [id number] DOB: September 25, 1950 Today's Date: 04/13/2019    History of Present Illness Patient is a 69 y/o male who presents with RLE ischemia now s/p Right femoral to below-knee popliteal bypass with translocated nonreversed great saphenous vein 1/29. PMh includes aortic stenosis, HTN, HLD, CHF, CAD, PAD, anxiety, ESRD on HD,    PT Comments    Patient not progressing towards PT goals today. Pt had dialysis today and is very lethargic limiting participation and progress. Requires increased time/effort to transition to EOB with max step by step cues. Requires Mod-Max A for bed mobility and heavy Mod A of 2 to stand from elevated bed height. Not safe to transfer to chair today and unable to initiate steps. VSS throughout. Continue to recommend SNF as pt not safe to go home in this state as he is requiring heavy physical assist for any mobility. Will follow.    Follow Up Recommendations  SNF;Supervision for mobility/OOB     Equipment Recommendations  None recommended by PT    Recommendations for Other Services       Precautions / Restrictions Precautions Precautions: Fall Restrictions Weight Bearing Restrictions: No    Mobility  Bed Mobility Overal bed mobility: Needs Assistance Bed Mobility: Rolling;Sidelying to Sit Rolling: Mod assist Sidelying to sit: Mod assist;HOB elevated   Sit to supine: Max assist;+2 for physical assistance   General bed mobility comments: Able to move LLE to EOB, assist with RLE and scooting botttom to EOB as well as elevating trunk to get upright. Favoring left lateral lean today. Increased time/cues to get upright. Assist to bring LEs into supine and lower trunk to get into bed.  Transfers Overall transfer level: Needs assistance Equipment used: Rolling walker (2 wheeled) Transfers: Sit to/from Stand Sit to Stand: Mod assist;+2 physical assistance;From elevated surface          General transfer comment: Heavy Mod A of 2 to power to standing with cues for foot/hand placement, and use of momentum. Posterior bias. Reluctant to place weight through RLE, assist with hip extension and upright.  Ambulation/Gait             General Gait Details: Unable.   Stairs             Wheelchair Mobility    Modified Rankin (Stroke Patients Only)       Balance Overall balance assessment: Needs assistance Sitting-balance support: Feet supported;Single extremity supported Sitting balance-Leahy Scale: Poor Sitting balance - Comments: Leaning on left forearm for most of session, increased time/assist to get to upright/midline requiring BUEs support to maintain balance. Postural control: Left lateral lean Standing balance support: During functional activity Standing balance-Leahy Scale: Zero Standing balance comment: Requires BUE support and external support for standing balance; posterior bias. Difficulty extending RLE and placing weight through it.                            Cognition Arousal/Alertness: Lethargic Behavior During Therapy: Flat affect Overall Cognitive Status: Impaired/Different from baseline Area of Impairment: Following commands;Problem solving;Attention;Orientation                 Orientation Level: Disoriented to;Time Current Attention Level: Focused   Following Commands: Follows one step commands with increased time;Follows one step commands inconsistently     Problem Solving: Slow processing;Decreased initiation;Difficulty sequencing;Requires verbal cues;Requires tactile cues General Comments: "It is still February" despite it being the first day  of Feb today. When asked the year x2, pt states "yea." Stating comments unrelated to questions asked. Following 1 step commands with increased time, repetition and multimodal cues. Very lethargic and sleepy post dialysis.      Exercises General Exercises - Lower  Extremity Ankle Circles/Pumps: Both;10 reps;Supine Quad Sets: 5 reps;Both;Supine Gluteal Sets: 15 reps;Supine;Both    General Comments General comments (skin integrity, edema, etc.): VSS throughout.      Pertinent Vitals/Pain Pain Assessment: Faces Faces Pain Scale: Hurts even more Pain Location: RLe with mobility Pain Descriptors / Indicators: Guarding;Sore Pain Intervention(s): Repositioned;Monitored during session;Limited activity within patient's tolerance;Patient requesting pain meds-RN notified    Home Living                      Prior Function            PT Goals (current goals can now be found in the care plan section) Progress towards PT goals: Not progressing toward goals - comment(due to lethargy/fatigue/weakness)    Frequency    Min 3X/week      PT Plan Current plan remains appropriate    Co-evaluation PT/OT/SLP Co-Evaluation/Treatment: Yes Reason for Co-Treatment: Necessary to address cognition/behavior during functional activity;For patient/therapist safety;To address functional/ADL transfers PT goals addressed during session: Mobility/safety with mobility;Balance        AM-PAC PT "6 Clicks" Mobility   Outcome Measure  Help needed turning from your back to your side while in a flat bed without using bedrails?: A Little Help needed moving from lying on your back to sitting on the side of a flat bed without using bedrails?: A Lot Help needed moving to and from a bed to a chair (including a wheelchair)?: A Lot Help needed standing up from a chair using your arms (e.g., wheelchair or bedside chair)?: A Lot Help needed to walk in hospital room?: Total Help needed climbing 3-5 steps with a railing? : Total 6 Click Score: 11    End of Session Equipment Utilized During Treatment: Gait belt Activity Tolerance: Patient limited by lethargy;Patient limited by fatigue Patient left: in bed;with call bell/phone within reach;with bed alarm set Nurse  Communication: Mobility status;Other (comment)(arousal) PT Visit Diagnosis: Pain;Muscle weakness (generalized) (M62.81);Difficulty in walking, not elsewhere classified (R26.2) Pain - Right/Left: Right Pain - part of body: Leg     Time: 1430-1501 PT Time Calculation (min) (ACUTE ONLY): 31 min  Charges:  $Therapeutic Activity: 8-22 mins                     Marisa Severin, PT, DPT Acute Rehabilitation Services Pager [phone] Office [place] 04/13/2019, 3:51 PM

## 2019-04-13 NOTE — Progress Notes (Signed)
Gauze in between toes on right foot changed as ordered.
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
## 2019-04-13 NOTE — Progress Notes (Signed)
[facility] Progress Note   Dialysis Orders: MWF - [facility]  4hrs, BFR 400, DFR 800,  EDW 79kg, 2K/ 2Ca  Access: RU AVF +b  Heparin 2400 Mircera 30 mcg q2wks - last 04/01/19 Calcitriol 1.7mcg PO qHD Parsabiv 7.5mg  qHD - not on hospital formulary   Assessment/Plan: 1.  PAD w/critical RLE ischemia and rest pain - s/p  right common femoral to below-knee popliteal artery 1/29 2.  ESRD -  MWF.  last HD Saturday -  Ran 3.75 hr to try to get K down - back on schedule today  3.  Hypertension/volume  - Blood pressure better - net UF 2.5 Thursday  - post wt 78.1 - actually on low dose BB and midodrine for BP support -net UF 2 L Saturday with post wt  76.4 - pre wt this AM 78.4kg, trial 1.5L UF.  Will need new EDW at D/C most likely but he's very reluctant to do so. Cont discussion.  4.  Anemia of CKD - Hgb 11.9 > 10.9 > 9.9 - continue to trend. ESA  given 1/20- redose next week 5.  Secondary Hyperparathyroidism -  P 6.8  Continue binders- on 3 Aurixya, and VDRA. 6.  Nutrition - Renal diet w/fluid restrictions 7. Thrombocytopenia - plt 90s - 100 8. Severe AS s/p TAVR 9. NSVT - on amiodarone 10. Ischemic CM - per primary 11. Disp - declines SNF - plan home with HH PT  Jannifer Hick MD [facility]   Subjective:   Feeling ok - thinking he'll be ready for D/C Wednesday.  Would do CIR but not interested in SNF.   Has been feeling washed out after HD.    Objective Vitals:   04/12/19 1630 04/12/19 1958 04/13/19 0031 04/13/19 0300  BP: (!) 130/56 (!) 126/53 (!) 136/56   Pulse: 73 78 80   Resp: 20 18    Temp: 98.7 F (37.1 C) 98.1 F (36.7 C) 98.5 F (36.9 C)   TempSrc: Oral Oral Oral   SpO2: 94% 94% 98%   Weight:    78.4 kg  Height:    5\' 10"  (1.778 m)   Physical Exam  General: NAD on HD breathing easily Heart: RRR Lungs: no rales Abdomen: soft NT Extremities: no LE edema, right LE incisions look good - foot warm Dialysis Access: right upper AVF +  bruit   Additional Objective Labs: Basic Metabolic Panel: Recent Labs  Lab 04/10/19 0348 04/10/19 1400 04/11/19 0242  NA 141 140 136  K 5.1 5.6* 5.5*  CL 97* 98 93*  CO2 29 23 25   GLUCOSE 93 122* 122*  BUN 26* 37* 52*  CREATININE 5.45* 6.39* 7.44*  CALCIUM 8.2* 8.1* 7.9*  PHOS  --   --  6.8*   Liver Function Tests: Recent Labs  Lab 04/11/19 0242  ALBUMIN 2.7*   No results for input(s): LIPASE, AMYLASE in the last 168 hours. CBC: Recent Labs  Lab 04/09/19 1948 04/10/19 0348 04/11/19 0242  WBC 11.3* 8.4 12.2*  HGB 11.3* 10.9* 9.9*  HCT 35.0* 34.6* 30.5*  MCV 106.7* 108.5* 105.5*  PLT 102* 98* 98*   Blood Culture    Component Value Date/Time   SDES URINE, RANDOM 02/01/2016 1707   SPECREQUEST NONE 02/01/2016 1707   CULT (A) 02/01/2016 1707    10,000 COLONIES/mL PSEUDOMONAS AERUGINOSA 40,000 COLONIES/mL ENTEROBACTER AEROGENES    REPTSTATUS 02/04/2016 FINAL 02/01/2016 1707    Cardiac Enzymes: No results for input(s): CKTOTAL, CKMB, CKMBINDEX, TROPONINI in the last  168 hours. CBG: Recent Labs  Lab 04/12/19 0034  GLUCAP 123*   Iron Studies: No results for input(s): IRON, TIBC, TRANSFERRIN, FERRITIN in the last 72 hours. Lab Results  Component Value Date   INR 1.3 (H) 10/16/2018   INR 1.2 07/22/2018   INR 1.27 01/23/2016   PROTIME 13.2 01/21/2015   Studies/Results: No results found. Medications: . sodium chloride    . sodium chloride    . sodium chloride    . sodium chloride 50 mL/hr at 04/10/19 1321  . magnesium sulfate bolus IVPB     . amiodarone  200 mg Oral Daily  . atorvastatin  80 mg Oral Q supper  . calcitRIOL  1.5 mcg Oral Q M,W,F-HD  . calcitRIOL  1.5 mcg Oral Once in dialysis  . carvedilol  3.125 mg Oral BID  . Chlorhexidine Gluconate Cloth  6 each Topical Q0600  . clopidogrel  75 mg Oral Q supper  . docusate sodium  100 mg Oral Daily  . ferric citrate  630 mg Oral TID WC  . heparin  5,000 Units Subcutaneous Q8H  . midodrine  10  mg Oral Q M,W,F-HD  . multivitamin  1 tablet Oral QHS  . pantoprazole  40 mg Oral Daily  . vitamin B-12  1,000 mcg Oral QPM

## 2019-04-13 NOTE — Progress Notes (Signed)
Pt gets his morning bath, weight on the stand up scale, CHG bath given. Report given to Hemodialysis RN.

## 2019-04-13 NOTE — Progress Notes (Signed)
Pt received from HD. VSS. Lunch tray ordered. Will continue to monitor.  Clyde Canterbury, RN

## 2019-04-14 LAB — RENAL FUNCTION PANEL
Albumin: 2.7 g/dL — ABNORMAL LOW (ref 3.5–5.0)
Anion gap: 16 — ABNORMAL HIGH (ref 5–15)
BUN: 46 mg/dL — ABNORMAL HIGH (ref 8–23)
CO2: 27 mmol/L (ref 22–32)
Calcium: 8.2 mg/dL — ABNORMAL LOW (ref 8.9–10.3)
Chloride: 93 mmol/L — ABNORMAL LOW (ref 98–111)
Creatinine, Ser: 6.49 mg/dL — ABNORMAL HIGH (ref 0.61–1.24)
GFR calc Af Amer: 9 mL/min — ABNORMAL LOW (ref >60)
GFR calc non Af Amer: 8 mL/min — ABNORMAL LOW (ref >60)
Glucose, Bld: 104 mg/dL — ABNORMAL HIGH (ref 70–99)
Phosphorus: 6.4 mg/dL — ABNORMAL HIGH (ref 2.5–4.6)
Potassium: 4.2 mmol/L (ref 3.5–5.1)
Sodium: 136 mmol/L (ref 135–145)

## 2019-04-14 LAB — CBC
HCT: 31.1 % — ABNORMAL LOW (ref 39.0–52.0)
Hemoglobin: 9.8 g/dL — ABNORMAL LOW (ref 13.0–17.0)
MCH: 33.7 pg (ref 26.0–34.0)
MCHC: 31.5 g/dL (ref 30.0–36.0)
MCV: 106.9 fL — ABNORMAL HIGH (ref 80.0–100.0)
Platelets: 121 10*3/uL — ABNORMAL LOW (ref 150–400)
RBC: 2.91 MIL/uL — ABNORMAL LOW (ref 4.22–5.81)
RDW: 14.5 % (ref 11.5–15.5)
WBC: 11.7 10*3/uL — ABNORMAL HIGH (ref 4.0–10.5)
nRBC: 0 % (ref 0.0–0.2)

## 2019-04-14 NOTE — Plan of Care (Signed)
Continue to monitor

## 2019-04-14 NOTE — Progress Notes (Signed)
Physical Therapy Treatment Patient Details Name: Jose Garrett MRN: [id number] DOB: 10/18/1950 Today's Date: 04/14/2019    History of Present Illness Patient is a 69 y/o male who presents with RLE ischemia now s/p Right femoral to below-knee popliteal bypass with translocated nonreversed great saphenous vein 1/29. PMh includes aortic stenosis, HTN, HLD, CHF, CAD, PAD, anxiety, ESRD on HD,    PT Comments    Pt with better arousal level and mobility this session vs yesterday after dialysis, requiring min guard to min assist +2 for bed mobility, transfer, and gait today. Pt ambulated 10, then 15 ft today requiring seated rest break due to severe pain and fatigue. Pt required step-by-step sequencing assist for gait, with PT encouraging pt to push through RW to off-weight RLE as needed. Per pt, he will have 24/7 assist at home from sister-in-law as well as from daughter as needed, he has needed ambulation equipment, he will take rest breaks as needed, and will have supervision for all mobility. PT feels HHPT is appropriate at this time, plan updated to reflect this.    Follow Up Recommendations  Home health PT;Supervision/Assistance - 24 hour     Equipment Recommendations  None recommended by PT    Recommendations for Other Services       Precautions / Restrictions Restrictions Weight Bearing Restrictions: No    Mobility  Bed Mobility Overal bed mobility: Needs Assistance Bed Mobility: Supine to Sit     Supine to sit: Min guard     General bed mobility comments: HOB flattened to simulate bed at home. Min guard for safety, pt with use of bedrails to come to sitting and very increased time to perform.  Transfers Overall transfer level: Needs assistance Equipment used: Rolling walker (2 wheeled) Transfers: Sit to/from Stand Sit to Stand: +2 physical assistance;Min assist;+2 safety/equipment         General transfer comment: Min +2 for sit to stand for hand placement when  rising/sitting (pt with tendency to attempt to use bilateral UEs on RW to pull up), initial power up, steadying upon rising. Sit to stand x2 during session, from EOB and from chair during seated rest break prior to ADLs at sink.  Ambulation/Gait Ambulation/Gait assistance: Min assist;+2 safety/equipment;+2 physical assistance Gait Distance (Feet): 25 [id number]) Assistive device: Rolling walker (2 wheeled) Gait Pattern/deviations: Step-to pattern;Step-through pattern;Decreased stride length;Trunk flexed;Antalgic;Decreased weight shift to right;Decreased step length - right Gait velocity: decr   General Gait Details: Min assist +2 for steadying, sequencing with multimodal cuing, RW management, progression RLE at times, chair follow for seated rest break. Pt with very decreased WB through RLE, PT encouraging foot flat to pt tolerance with use of RW to offweight RLE as needed. Seated rest break after 10 ft ambulation, very slow gait with decreased foot clearance bilaterally.   Stairs             Wheelchair Mobility    Modified Rankin (Stroke Patients Only)       Balance Overall balance assessment: Needs assistance Sitting-balance support: Feet supported;No upper extremity supported Sitting balance-Leahy Scale: Fair Sitting balance - Comments: able to sit without PT/OT assist   Standing balance support: During functional activity;Single extremity supported Standing balance-Leahy Scale: Poor Standing balance comment: reliant on external support, able to stand at sink with single UE use, close guard for pt safety  Cognition Arousal/Alertness: Awake/alert Behavior During Therapy: WFL for tasks assessed/performed Overall Cognitive Status: Within Functional Limits for tasks assessed Area of Impairment: Following commands;Problem solving                       Following Commands: Follows one step commands consistently;Follows multi-step  commands with increased time     Problem Solving: Slow processing;Difficulty sequencing;Requires tactile cues;Requires verbal cues General Comments: Pt requires multi-step tasks to be repeated and broken down at times, requires increased time to mobilize. Verbal and tactile cuing for safe use of RW and ambulation sequencing.      Exercises      General Comments General comments (skin integrity, edema, etc.): vss      Pertinent Vitals/Pain Pain Assessment: 0-10 Pain Score: 8  Pain Location: RLE Pain Descriptors / Indicators: Sore;Other (Comment)(pulling) Pain Intervention(s): Limited activity within patient's tolerance;Monitored during session;Repositioned;Patient requesting pain meds-RN notified    Home Living                      Prior Function            PT Goals (current goals can now be found in the care plan section) Acute Rehab PT Goals Patient Stated Goal: to go home PT Goal Formulation: With patient Time For Goal Achievement: 04/24/19 Potential to Achieve Goals: Fair Progress towards PT goals: Progressing toward goals    Frequency    Min 3X/week      PT Plan Discharge plan needs to be updated    Co-evaluation PT/OT/SLP Co-Evaluation/Treatment: Yes Reason for Co-Treatment: Necessary to address cognition/behavior during functional activity;For patient/therapist safety;To address functional/ADL transfers PT goals addressed during session: Mobility/safety with mobility;Balance;Proper use of DME        AM-PAC PT "6 Clicks" Mobility   Outcome Measure  Help needed turning from your back to your side while in a flat bed without using bedrails?: A Little Help needed moving from lying on your back to sitting on the side of a flat bed without using bedrails?: A Little Help needed moving to and from a bed to a chair (including a wheelchair)?: A Little Help needed standing up from a chair using your arms (e.g., wheelchair or bedside chair)?: A  Little Help needed to walk in hospital room?: A Lot Help needed climbing 3-5 steps with a railing? : A Lot 6 Click Score: 16    End of Session Equipment Utilized During Treatment: Gait belt Activity Tolerance: Patient limited by fatigue;Patient limited by pain Patient left: with call bell/phone within reach;in chair;with chair alarm set Nurse Communication: Mobility status;Patient requests pain meds PT Visit Diagnosis: Pain;Muscle weakness (generalized) (M62.81);Difficulty in walking, not elsewhere classified (R26.2) Pain - Right/Left: Right Pain - part of body: Leg     Time: 0100-7121 PT Time Calculation (min) (ACUTE ONLY): 36 min  Charges:  $Gait Training: 8-22 mins                     Dantae Meunier E, PT [place] Pager [phone]  Office [phone]    Jeralynn Vaquera D Elonda Husky 04/14/2019, 11:36 AM

## 2019-04-14 NOTE — Progress Notes (Addendum)
Progress Note    04/14/2019 7:45 AM 4 Days Post-Op  Subjective:  Painful R groin incision.  R foot still feeling better compared to pre-op.   Vitals:   04/13/19 2303 04/14/19 0510  BP: (!) 95/40 (!) 120/50  Pulse: 80 72  Resp: 20 18  Temp: 97.8 F (36.6 C) 98.5 F (36.9 C)  SpO2: 98% 97%   Physical Exam: Lungs:  Non labored Incisions:  RLE incisions c/d/i; R calf soft Extremities:  R ATA and PT by doppler; some dusky toes R foot especially third toe Neurologic: A&O  CBC    Component Value Date/Time   WBC 11.7 (H) 04/14/2019 0430   RBC 2.91 (L) 04/14/2019 0430   HGB 9.8 (L) 04/14/2019 0430   HGB 11.4 (L) 11/12/2018 1637   HGB 10.8 (L) 03/14/2017 0921   HCT 31.1 (L) 04/14/2019 0430   HCT 35.0 (L) 11/12/2018 1637   HCT 32.5 (L) 03/14/2017 0921   PLT 121 (L) 04/14/2019 0430   PLT 51 (LL) 11/12/2018 1637   MCV 106.9 (H) 04/14/2019 0430   MCV 103 (H) 11/12/2018 1637   MCV 106.2 (H) 03/14/2017 0921   MCH 33.7 04/14/2019 0430   MCHC 31.5 04/14/2019 0430   RDW 14.5 04/14/2019 0430   RDW 14.6 11/12/2018 1637   RDW 14.6 03/14/2017 0921   LYMPHSABS 1.0 11/12/2018 1637   LYMPHSABS 0.8 (L) 03/14/2017 0921   MONOABS 0.4 10/20/2018 0638   MONOABS 0.6 03/14/2017 0921   EOSABS 0.2 11/12/2018 1637   BASOSABS 0.0 11/12/2018 1637   BASOSABS 0.0 03/14/2017 0921    BMET    Component Value Date/Time   NA 136 04/14/2019 0430   NA 135 07/14/2018 0909   NA 141 03/14/2017 0921   K 4.2 04/14/2019 0430   K 4.1 03/14/2017 0921   CL 93 (L) 04/14/2019 0430   CO2 27 04/14/2019 0430   CO2 33 (H) 03/14/2017 0921   GLUCOSE 104 (H) 04/14/2019 0430   GLUCOSE 144 (H) 03/14/2017 0921   BUN 46 (H) 04/14/2019 0430   BUN 76 (HH) 07/14/2018 0909   BUN 30.1 (H) 03/14/2017 0921   CREATININE 6.49 (H) 04/14/2019 0430   CREATININE 9.16 (HH) 09/17/2017 0954   CREATININE 8.1 (HH) 03/14/2017 0921   CALCIUM 8.2 (L) 04/14/2019 0430   CALCIUM 9.5 03/14/2017 0921   GFRNONAA 8 (L) 04/14/2019 0430    GFRNONAA 5 (L) 09/17/2017 0954   GFRNONAA 24 (L) 06/30/2014 1528   GFRAA 9 (L) 04/14/2019 0430   GFRAA 6 (L) 09/17/2017 0954   GFRAA 28 (L) 06/30/2014 1528    INR    Component Value Date/Time   INR 1.3 (H) 10/16/2018 1045   INR 1.10 (L) 01/21/2015 0809     Intake/Output Summary (Last 24 hours) at 04/14/2019 0745 Last data filed at 04/13/2019 1211 Gross per 24 hour  Intake 240 ml  Output 1523 ml  Net -1283 ml     Assessment/Plan:  69 y.o. male is s/p R femoral to BK pop bypass with vein 4 Days Post-Op   R foot doppler exam stable Dusky toes especially third toe; will allow time for demarcation OOB with therapy teams today Tentative plan for discharge home with HHPT tomorrow after dialysis   Dagoberto Ligas, PA-C [facility] [phone] 04/14/2019 7:45 AM  I have independently interviewed and examined patient and agree with PA assessment and plan above. He had just finished working with PT and was quite exhausted. Will likely discharge with home health  physical therapy tomorrow after dialysis.  Eriana Suliman C. Donzetta Matters, MD [facility] [place] Office: [phone] Pager: [phone]

## 2019-04-14 NOTE — Progress Notes (Addendum)
Subjective:  Siting up in chair eating lunch  No new cos , noted possible dc tomorrow after hd   Objective Vital signs in last 24 hours: Vitals:   04/14/19 0510 04/14/19 0749 04/14/19 0946 04/14/19 1114  BP: (!) 120/50 (!) 113/49 (!) 104/52 (!) 93/40  Pulse: 72 75 77 77  Resp: 18 18 15 19   Temp: 98.5 F (36.9 C) 98.2 F (36.8 C)  98.2 F (36.8 C)  TempSrc: Oral Oral  Oral  SpO2: 97% 95% 92% 92%  Weight:      Height:       Weight change: 0.3 kg  Physical Exam: General: alert NAD Heart: RRR , no rub Lungs: CTA ,nonlabored breathing  Abdomen: BS nl , soft , NT, ND Extremities: no LE edema, R foot  Incisions dry / clean  Dialysis Access: RUA AVF  Pos bruit     Dialysis Orders: MWF -[facility] 4hrs, BFR400, [id number], EDW 79kg,2K/2Ca  Access:RU AVF +b Heparin2400 Mircera32mcg q2wks - last 04/01/19 Calcitriol1.41mcg PO qHD Parsabiv 7.5mg  qHD - not on hospital formulary  Problem/Plan: 1. PAD w/critical RLE ischemia and rest pain- s/p right common femoral to below-knee popliteal artery 1/29 2. ESRD-  MWF. HD  yesterday to get  back on schedule   am k 4.12  3. Hypertension/volume- yesterday  1.5 l uf  Blood pressure ok // lower edw at dc -- actually on low dose BB and midodrine for BP support -.  Will need new EDW at D/C most likely but he's very reluctant to do so. Cont discussion.  4. Anemiaof CKD- Hgb 11.9 > 10.9 > 9.9 >9.8 - continue to trend. ESA  given 1/20- redose next week 5. Secondary Hyperparathyroidism -P 6.8>6.4  Continue binders- on 3 Aurixya, and VDRA. 6. Nutrition- Renal diet w/fluid restrictions 7. Thrombocytopenia - plt 90s - 100>121 8. Severe AS s/p TAVR 9. NSVT - on amiodarone 10. Ischemic CM - per primary 11. Disp - declines SNF - plan home with [facility]  Ernest Haber, PA-C [place] [phone] 04/14/2019,11:47 AM  LOS: 5 days   Labs: Basic Metabolic Panel: Recent Labs  Lab 04/11/19 0242 04/13/19 0838  04/14/19 0430  NA 136 139 136  K 5.5* 4.8 4.2  CL 93* 96* 93*  CO2 25 24 27   GLUCOSE 122* 109* 104*  BUN 52* 67* 46*  CREATININE 7.44* 8.96* 6.49*  CALCIUM 7.9* 8.3* 8.2*  PHOS 6.8* 7.0* 6.4*   Liver Function Tests: Recent Labs  Lab 04/11/19 0242 04/13/19 0838 04/14/19 0430  ALBUMIN 2.7* 2.8* 2.7*   No results for input(s): LIPASE, AMYLASE in the last 168 hours. No results for input(s): AMMONIA in the last 168 hours. CBC: Recent Labs  Lab 04/09/19 1948 04/09/19 1948 04/10/19 0348 04/10/19 0348 04/11/19 0242 04/13/19 0838 04/14/19 0430  WBC 11.3*   < > 8.4   < > 12.2* 9.9 11.7*  HGB 11.3*   < > 10.9*   < > 9.9* 10.0* 9.8*  HCT 35.0*   < > 34.6*   < > 30.5* 31.0* 31.1*  MCV 106.7*  --  108.5*  --  105.5* 106.2* 106.9*  PLT 102*   < > 98*   < > 98* 115* 121*   < > = values in this interval not displayed.   Cardiac Enzymes: No results for input(s): CKTOTAL, CKMB, CKMBINDEX, TROPONINI in the last 168 hours. CBG: Recent Labs  Lab 04/12/19 0034  GLUCAP 123*    Studies/Results: No results found. Medications: .  sodium chloride    . sodium chloride 50 mL/hr at 04/10/19 1321  . magnesium sulfate bolus IVPB     . amiodarone  200 mg Oral Daily  . atorvastatin  80 mg Oral Q supper  . calcitRIOL  1.5 mcg Oral Q M,W,F-HD  . carvedilol  3.125 mg Oral BID  . Chlorhexidine Gluconate Cloth  6 each Topical Q0600  . clopidogrel  75 mg Oral Q supper  . docusate sodium  100 mg Oral Daily  . ferric citrate  630 mg Oral TID WC  . heparin  5,000 Units Subcutaneous Q8H  . midodrine  10 mg Oral Q M,W,F-HD  . multivitamin  1 tablet Oral QHS  . pantoprazole  40 mg Oral Daily  . vitamin B-12  1,000 mcg Oral QPM

## 2019-04-14 NOTE — Progress Notes (Signed)
Occupational Therapy Treatment Patient Details Name: Jose Garrett MRN: [id number] DOB: September 09, 1950 Today's Date: 04/14/2019    History of present illness Patient is a 69 y/o male who presents with RLE ischemia now s/p Right femoral to below-knee popliteal bypass with translocated nonreversed great saphenous vein 1/29. PMh includes aortic stenosis, HTN, HLD, CHF, CAD, PAD, anxiety, ESRD on HD,   OT comments  Pt making good progress this session able to increase functional mobility and complete standing grooming tasks at sink. Pt completed household distance functional mobility with RW and min guard- min a +2 for balance as pt not wanting to fully WB through RLE. Pt ambulated from EOB>doorway>sink>recliner with 1 seated rest break and 1 standing rest break. Education provided on LB dressing strategy with reacher with pt verbalizing understanding. Pt requires min guard for standing grooming at sink with at least one UE supported. DC plan remains appropriate, will follow for OT needs.   Follow Up Recommendations  Home health OT;Supervision/Assistance - 24 hour    Equipment Recommendations  None recommended by OT    Recommendations for Other Services      Precautions / Restrictions Precautions Precautions: Fall Restrictions Weight Bearing Restrictions: No       Mobility Bed Mobility Overal bed mobility: Needs Assistance Bed Mobility: Supine to Sit     Supine to sit: Min guard     General bed mobility comments: HOB flattened to simulate bed at home. Min guard for safety, pt with use of bedrails to come to sitting and very increased time to perform.  Transfers Overall transfer level: Needs assistance Equipment used: Rolling walker (2 wheeled) Transfers: Sit to/from Stand Sit to Stand: +2 physical assistance;Min assist;+2 safety/equipment         General transfer comment: Min +2 for sit to stand for hand placement when rising/sitting (pt with tendency to attempt to use bilateral  UEs on RW to pull up), initial power up, steadying upon rising. Sit to stand x2 during session, from EOB and from chair during seated rest break prior to ADLs at sink.    Balance Overall balance assessment: Needs assistance Sitting-balance support: Feet supported;No upper extremity supported Sitting balance-Leahy Scale: Fair Sitting balance - Comments: able to sit without PT/OT assist   Standing balance support: During functional activity;Single extremity supported Standing balance-Leahy Scale: Poor Standing balance comment: reliant on external support, able to stand at sink with single UE use, close guard for pt safety                           ADL either performed or assessed with clinical judgement   ADL Overall ADL's : Needs assistance/impaired     Grooming: Wash/dry face;Standing;Supervision/safety;Set up;Min guard Grooming Details (indicate cue type and reason): intermittent min guard initially progressing to supervision for balance         Upper Body Dressing : Minimal assistance;Sitting Upper Body Dressing Details (indicate cue type and reason): hospital gown as back side cover Lower Body Dressing: Moderate assistance;Sit to/from stand Lower Body Dressing Details (indicate cue type and reason): issued pt reacher and provided education and demo of using reacher to don pants Toilet Transfer: Minimal assistance;Moderate assistance;+2 for physical assistance;Ambulation;RW Toilet Transfer Details (indicate cue type and reason): simulated to recliner with pt needed min - mod A +2 d/t poor balance and increased pain with [place]       Tub/Shower Transfer Details (indicate cue type and reason): pt reports having seat for  walkin shower Functional mobility during ADLs: Minimal assistance;Moderate assistance;+2 for physical assistance;Rolling walker;+2 for safety/equipment General ADL Comments: session focus on LB dressing technique with AE, functional mobility and standing  grooming tasks     Vision Baseline Vision/History: Wears glasses Wears Glasses: At all times Patient Visual Report: No change from baseline     Perception     Praxis      Cognition Arousal/Alertness: Awake/alert Behavior During Therapy: WFL for tasks assessed/performed Overall Cognitive Status: Within Functional Limits for tasks assessed Area of Impairment: Following commands;Problem solving                       Following Commands: Follows one step commands consistently;Follows multi-step commands with increased time     Problem Solving: Slow processing;Difficulty sequencing;Requires tactile cues;Requires verbal cues General Comments: Pt requires multi-step tasks to be repeated and broken down at times, requires increased time to mobilize. Verbal and tactile cuing for safe use of RW and ambulation sequencing.        Exercises     Shoulder Instructions       General Comments vss    Pertinent Vitals/ Pain       Pain Assessment: 0-10 Faces Pain Scale: Hurts even more Pain Location: RLE Pain Descriptors / Indicators: Sore;Other (Comment)(pulling) Pain Intervention(s): Limited activity within patient's tolerance;Monitored during session;Repositioned;Patient requesting pain meds-RN notified  Home Living                                          Prior Functioning/Environment              Frequency  Min 2X/week        Progress Toward Goals  OT Goals(current goals can now be found in the care plan section)  Progress towards OT goals: Progressing toward goals  Acute Rehab OT Goals Patient Stated Goal: to go home OT Goal Formulation: With patient Time For Goal Achievement: 04/26/19 Potential to Achieve Goals: Good  Plan Discharge plan remains appropriate    Co-evaluation                 AM-PAC OT "6 Clicks" Daily Activity     Outcome Measure   Help from another person eating meals?: None Help from another person  taking care of personal grooming?: A Little Help from another person toileting, which includes using toliet, bedpan, or urinal?: A Little Help from another person bathing (including washing, rinsing, drying)?: A Little Help from another person to put on and taking off regular upper body clothing?: A Little Help from another person to put on and taking off regular lower body clothing?: A Lot 6 Click Score: 18    End of Session Equipment Utilized During Treatment: Gait belt;Rolling walker;Other (comment)(reacher)  OT Visit Diagnosis: Unsteadiness on feet (R26.81);Pain;Muscle weakness (generalized) (M62.81) Pain - Right/Left: Right Pain - part of body: Leg   Activity Tolerance Patient tolerated treatment well   Patient Left in chair;with call bell/phone within reach   Nurse Communication Mobility status;Other (comment)(requesting pain meds)        Time: 7654-6503 OT Time Calculation (min): 36 min  Charges: OT General Charges $OT Visit: 1 Visit OT Treatments $Self Care/Home Management : 8-22 mins  Lanier Clam., COTA/L Acute Rehabilitation Services [phone] [place] 04/14/2019, 3:26 PM

## 2019-04-14 NOTE — Progress Notes (Signed)
Patient's BP 96/39 (52) HR 74. Patient has no dizziness or c/o pain but states he's "a little tired". PA notfied. Will continue to monitor.

## 2019-04-14 NOTE — Progress Notes (Signed)
Gauze on R toes changed per order

## 2019-04-15 LAB — RENAL FUNCTION PANEL
Albumin: 2.8 g/dL — ABNORMAL LOW (ref 3.5–5.0)
Anion gap: 23 — ABNORMAL HIGH (ref 5–15)
BUN: 70 mg/dL — ABNORMAL HIGH (ref 8–23)
CO2: 24 mmol/L (ref 22–32)
Calcium: 8.7 mg/dL — ABNORMAL LOW (ref 8.9–10.3)
Chloride: 90 mmol/L — ABNORMAL LOW (ref 98–111)
Creatinine, Ser: 8.44 mg/dL — ABNORMAL HIGH (ref 0.61–1.24)
GFR calc Af Amer: 7 mL/min — ABNORMAL LOW (ref >60)
GFR calc non Af Amer: 6 mL/min — ABNORMAL LOW (ref >60)
Glucose, Bld: 97 mg/dL (ref 70–99)
Phosphorus: 7.9 mg/dL — ABNORMAL HIGH (ref 2.5–4.6)
Potassium: 4.5 mmol/L (ref 3.5–5.1)
Sodium: 137 mmol/L (ref 135–145)

## 2019-04-15 LAB — CBC
HCT: 29.6 % — ABNORMAL LOW (ref 39.0–52.0)
Hemoglobin: 9.4 g/dL — ABNORMAL LOW (ref 13.0–17.0)
MCH: 33.7 pg (ref 26.0–34.0)
MCHC: 31.8 g/dL (ref 30.0–36.0)
MCV: 106.1 fL — ABNORMAL HIGH (ref 80.0–100.0)
Platelets: 126 10*3/uL — ABNORMAL LOW (ref 150–400)
RBC: 2.79 MIL/uL — ABNORMAL LOW (ref 4.22–5.81)
RDW: 14.3 % (ref 11.5–15.5)
WBC: 10 10*3/uL (ref 4.0–10.5)
nRBC: 0 % (ref 0.0–0.2)

## 2019-04-15 MED ORDER — HEPARIN SODIUM (PORCINE) 1000 UNIT/ML IJ SOLN
INTRAMUSCULAR | Status: AC
  Administered 2019-04-15: 2400 [IU]
  Filled 2019-04-15: qty 3

## 2019-04-15 MED ORDER — MIDODRINE HCL 5 MG PO TABS
ORAL_TABLET | ORAL | Status: AC
  Administered 2019-04-15: 10 mg via ORAL
  Filled 2019-04-15: qty 2

## 2019-04-15 MED ORDER — CALCITRIOL 0.5 MCG PO CAPS
ORAL_CAPSULE | ORAL | Status: AC
  Filled 2019-04-15: qty 3

## 2019-04-15 MED ORDER — OXYCODONE HCL 5 MG PO TABS: 5.0000 mg | ORAL_TABLET | Freq: Four times a day (QID) | ORAL | 0 refills | Status: DC | PRN

## 2019-04-15 NOTE — Progress Notes (Signed)
Occupational Therapy Treatment Patient Details Name: Jose Garrett MRN: [id number] DOB: July 09, 1950 Today's Date: 04/15/2019    History of present illness Patient is a 69 y/o male who presents with RLE ischemia now s/p Right femoral to below-knee popliteal bypass with translocated nonreversed great saphenous vein 1/29. PMh includes aortic stenosis, HTN, HLD, CHF, CAD, PAD, anxiety, ESRD on HD,   OT comments  Pt not feeling well following HD, this is typical per his report. Reinforced compensatory strategies for ADL. Pt has all necessary DME. Continues to minimize weight on R LE.  Follow Up Recommendations  Home health OT;Supervision/Assistance - 24 hour    Equipment Recommendations  None recommended by OT    Recommendations for Other Services      Precautions / Restrictions Precautions Precautions: Fall       Mobility Bed Mobility Overal bed mobility: Needs Assistance Bed Mobility: Supine to Sit;Sit to Supine     Supine to sit: Min guard Sit to supine: Min assist   General bed mobility comments: assisted R LE back into bed  Transfers Overall transfer level: Needs assistance Equipment used: Rolling walker (2 wheeled) Transfers: Sit to/from Stand Sit to Stand: Min assist         General transfer comment: cues for hand placement, assist to rise and steady, pt took several steps to [facility] only    Balance Overall balance assessment: Needs assistance   Sitting balance-Leahy Scale: Good Sitting balance - Comments: no LOB with donning sock     Standing balance-Leahy Scale: Poor Standing balance comment: continues to be reluctant to put weight on R LE                           ADL either performed or assessed with clinical judgement   ADL Overall ADL's : Needs assistance/impaired               Lower Body Bathing Details (indicate cue type and reason): recommended long handled bath sponge for reaching R LE Upper Body Dressing : Set up;Sitting   Lower  Body Dressing: Set up;Sitting/lateral leans Lower Body Dressing Details (indicate cue type and reason): donned sock without AE, educated in dressing operated leg first           Tub/Shower Transfer Details (indicate cue type and reason): pt plans to stand, stated "I'll get in and out fast." per previous note, he has a shower seat         Vision       Perception     Praxis      Cognition Arousal/Alertness: Awake/alert Behavior During Therapy: WFL for tasks assessed/performed Overall Cognitive Status: Within Functional Limits for tasks assessed                                          Exercises     Shoulder Instructions       General Comments      Pertinent Vitals/ Pain       Pain Assessment: Faces Faces Pain Scale: Hurts little more Pain Location: RLE Pain Descriptors / Indicators: Sore Pain Intervention(s): Monitored during session;Repositioned;Premedicated before session  Home Living  Prior Functioning/Environment              Frequency  Min 2X/week        Progress Toward Goals  OT Goals(current goals can now be found in the care plan section)  Progress towards OT goals: Progressing toward goals  Acute Rehab OT Goals Patient Stated Goal: to go home OT Goal Formulation: With patient Time For Goal Achievement: 04/26/19 Potential to Achieve Goals: Good  Plan Discharge plan remains appropriate    Co-evaluation                 AM-PAC OT "6 Clicks" Daily Activity     Outcome Measure   Help from another person eating meals?: None Help from another person taking care of personal grooming?: A Little Help from another person toileting, which includes using toliet, bedpan, or urinal?: A Little Help from another person bathing (including washing, rinsing, drying)?: A Little Help from another person to put on and taking off regular upper body clothing?: None Help from  another person to put on and taking off regular lower body clothing?: A Little 6 Click Score: 20    End of Session Equipment Utilized During Treatment: Rolling walker  OT Visit Diagnosis: Unsteadiness on feet (R26.81);Pain;Muscle weakness (generalized) (M62.81) Pain - Right/Left: Right Pain - part of body: Leg   Activity Tolerance Patient limited by fatigue   Patient Left in bed;with call bell/phone within reach   Nurse Communication Other (comment)(does not need any other DME)        Time: 1450-1506 OT Time Calculation (min): 16 min  Charges: OT General Charges $OT Visit: 1 Visit OT Treatments $Self Care/Home Management : 8-22 mins  Nestor Lewandowsky, OTR/L Acute Rehabilitation Services Pager: [phone] Office: [phone]   Malka So 04/15/2019, 3:26 PM

## 2019-04-15 NOTE — Progress Notes (Signed)
Subjective:  Seen on HD. For D/C today. Feels ok - missed a few meals here and lost some weight but has appetite.   Objective Vital signs in last 24 hours: Vitals:   04/15/19 0716 04/15/19 0717 04/15/19 0730 04/15/19 0800  BP: (!) 132/59 (!) 120/48 (!) 99/46 (!) 108/55  Pulse: 69 66 69 69  Resp:      Temp:      TempSrc:      SpO2:      Weight:      Height:       Weight change: -1.9 kg  Physical Exam: General: alert NAD Heart: RRR , no rub Lungs: CTA ,nonlabored breathing  Abdomen: BS nl , soft , NT, ND Extremities: no LE edema, R foot  Incisions dry / clean  Dialysis Access: RUA AVF  Pos bruit     Dialysis Orders: MWF - [facility]  4hrs, BFR 400, DFR 800,  EDW 79kg, 2K/ 2Ca   Access: RU AVF +b  Heparin 2400 Mircera 30 mcg q2wks - last 04/01/19 Calcitriol 1.52mcg PO qHD Parsabiv 7.5mg  qHD - not on hospital formulary    Problem/Plan:  PAD w/critical RLE ischemia and rest pain - s/p  right common femoral to below-knee popliteal artery 1/29  ESRD -  MWF.  HD  Today.  K 4.2.  Hypertension/volume  - UFG 1.4L today. Needs post wt.   Blood pressure ok // lower edw at dc -- actually on low dose BB and midodrine for BP support .   Anemia of CKD - Hgb 11.9 > 10.9 > 9.9 >9.8 > 9.4. ESA  given 1/20- redose at next outpt HD - would ^ dose given < 10.    Secondary Hyperparathyroidism -  P 6.8>6.4   Continued binders- on 3 Aurixya, and VDRA.  Nutrition - Renal diet w/fluid restrictions Thrombocytopenia - plt 90s - 100>121>126. Severe AS s/p TAVR NSVT - on amiodarone Ischemic CM - per primary Disp - declines SNF - plan home with [facility] PT  Jannifer Hick MD [facility] Pager [phone]  Labs: Basic Metabolic Panel: Recent Labs  Lab 04/11/19 0242 04/13/19 0838 04/14/19 0430  NA 136 139 136  K 5.5* 4.8 4.2  CL 93* 96* 93*  CO2 25 24 27   GLUCOSE 122* 109* 104*  BUN 52* 67* 46*  CREATININE 7.44* 8.96* 6.49*  CALCIUM 7.9* 8.3* 8.2*  PHOS 6.8* 7.0* 6.4*   Liver Function  Tests: Recent Labs  Lab 04/11/19 0242 04/13/19 0838 04/14/19 0430  ALBUMIN 2.7* 2.8* 2.7*   No results for input(s): LIPASE, AMYLASE in the last 168 hours. No results for input(s): AMMONIA in the last 168 hours. CBC: Recent Labs  Lab 04/10/19 0348 04/10/19 0348 04/11/19 0242 04/11/19 0242 04/13/19 0838 04/14/19 0430 04/15/19 0740  WBC 8.4   < > 12.2*   < > 9.9 11.7* 10.0  HGB 10.9*   < > 9.9*   < > 10.0* 9.8* 9.4*  HCT 34.6*   < > 30.5*   < > 31.0* 31.1* 29.6*  MCV 108.5*  --  105.5*  --  106.2* 106.9* 106.1*  PLT 98*   < > 98*   < > 115* 121* 126*   < > = values in this interval not displayed.   Cardiac Enzymes: No results for input(s): CKTOTAL, CKMB, CKMBINDEX, TROPONINI in the last 168 hours. CBG: Recent Labs  Lab 04/12/19 0034  GLUCAP 123*    Studies/Results: No results found. Medications:  sodium chloride  sodium chloride 50 mL/hr at 04/10/19 1321   magnesium sulfate bolus IVPB      amiodarone  200 mg Oral Daily   atorvastatin  80 mg Oral Q supper   calcitRIOL  1.5 mcg Oral Q M,W,F-HD   carvedilol  3.125 mg Oral BID   Chlorhexidine Gluconate Cloth  6 each Topical Q0600   clopidogrel  75 mg Oral Q supper   docusate sodium  100 mg Oral Daily   ferric citrate  630 mg Oral TID WC   heparin  5,000 Units Subcutaneous Q8H   midodrine  10 mg Oral Q M,W,F-HD   multivitamin  1 tablet Oral QHS   pantoprazole  40 mg Oral Daily   vitamin B-12  1,000 mcg Oral QPM

## 2019-04-15 NOTE — Plan of Care (Signed)
Continue to monitor

## 2019-04-15 NOTE — Progress Notes (Addendum)
Progress Note    04/15/2019 12:14 PM 5 Days Post-Op  Subjective:  Feeling tired and weak after dialysis however patient states this is normal for him   Vitals:   04/15/19 1046 04/15/19 1125  BP: 134/64 130/60  Pulse: 71 72  Resp: 20 16  Temp: 97.8 F (36.6 C) 98.3 F (36.8 C)  SpO2: 93% 100%   Physical Exam: Lungs:  Non labored Incisions:  R groin and RLE incisions c/d/i Extremities:  Peroneal and PT doppler signal RLE at the level of the ankle; soft DP with more brisk ATA signal by doppler; toes and forefoot continue to darken in color, especially 3rd toe Neurologic: A&O  CBC    Component Value Date/Time   WBC 10.0 04/15/2019 0740   RBC 2.79 (L) 04/15/2019 0740   HGB 9.4 (L) 04/15/2019 0740   HGB 11.4 (L) 11/12/2018 1637   HGB 10.8 (L) 03/14/2017 0921   HCT 29.6 (L) 04/15/2019 0740   HCT 35.0 (L) 11/12/2018 1637   HCT 32.5 (L) 03/14/2017 0921   PLT 126 (L) 04/15/2019 0740   PLT 51 (LL) 11/12/2018 1637   MCV 106.1 (H) 04/15/2019 0740   MCV 103 (H) 11/12/2018 1637   MCV 106.2 (H) 03/14/2017 0921   MCH 33.7 04/15/2019 0740   MCHC 31.8 04/15/2019 0740   RDW 14.3 04/15/2019 0740   RDW 14.6 11/12/2018 1637   RDW 14.6 03/14/2017 0921   LYMPHSABS 1.0 11/12/2018 1637   LYMPHSABS 0.8 (L) 03/14/2017 0921   MONOABS 0.4 10/20/2018 0638   MONOABS 0.6 03/14/2017 0921   EOSABS 0.2 11/12/2018 1637   BASOSABS 0.0 11/12/2018 1637   BASOSABS 0.0 03/14/2017 0921    BMET    Component Value Date/Time   NA 137 04/15/2019 0741   NA 135 07/14/2018 0909   NA 141 03/14/2017 0921   K 4.5 04/15/2019 0741   K 4.1 03/14/2017 0921   CL 90 (L) 04/15/2019 0741   CO2 24 04/15/2019 0741   CO2 33 (H) 03/14/2017 0921   GLUCOSE 97 04/15/2019 0741   GLUCOSE 144 (H) 03/14/2017 0921   BUN 70 (H) 04/15/2019 0741   BUN 76 (HH) 07/14/2018 0909   BUN 30.1 (H) 03/14/2017 0921   CREATININE 8.44 (H) 04/15/2019 0741   CREATININE 9.16 (HH) 09/17/2017 0954   CREATININE 8.1 (HH) 03/14/2017 0921    CALCIUM 8.7 (L) 04/15/2019 0741   CALCIUM 9.5 03/14/2017 0921   GFRNONAA 6 (L) 04/15/2019 0741   GFRNONAA 5 (L) 09/17/2017 0954   GFRNONAA 24 (L) 06/30/2014 1528   GFRAA 7 (L) 04/15/2019 0741   GFRAA 6 (L) 09/17/2017 0954   GFRAA 28 (L) 06/30/2014 1528    INR    Component Value Date/Time   INR 1.3 (H) 10/16/2018 1045   INR 1.10 (L) 01/21/2015 0809     Intake/Output Summary (Last 24 hours) at 04/15/2019 1214 Last data filed at 04/15/2019 1046 Gross per 24 hour  Intake 240 ml  Output 1261 ml  Net -1021 ml     Assessment/Plan:  69 y.o. male is s/p R femoral to BK popliteal bypass with vein 5 Days Post-Op   -Perfusing RLE well with peroneal and PT signals at the ankle as well as DP signal -Toes of R foot continue to darken in color especially 3rd and 4th; discussed risk for requiring toe amp vs TMA with the patient; we will allow time for demarcation -Plan is for discharge home this evening with [facility] PT -He will follow up with  Dr. Donzetta Matters in about 2 weeks   Dagoberto Ligas, PA-C [facility] [phone] 04/15/2019 12:14 PM  I have examined the patient, reviewed and agree with above.  Pain under control.  Reports generalized weakness and does not feel comfortable being discharged this afternoon.  I again for tomorrow.  Has easily palpable popliteal graft pulse.  Curt Jews, MD 04/15/2019 3:13 PM

## 2019-04-15 NOTE — Progress Notes (Signed)
Spoke with [name], patient's daughter with patient status and updates.

## 2019-04-15 NOTE — TOC Transition Note (Signed)
Transition of Care [facility]) - CM/SW Discharge Note Marvetta Gibbons RN, BSN Transitions of Care Unit 4E- RN Case Manager [phone]   Patient Details  Name: Jose Garrett MRN: [id number] Date of Birth: October 14, 1950  [facility]) CM/SW Contact:  Dawayne Patricia, RN Phone Number: 04/15/2019, 3:08 PM   Clinical Narrative:    Pt stable for transition home today after HD, HH orders have been placed HHPT/OT/aide, no DME needed noted. Pt to stay with [facility] post discharge refused SNF. [facility] has accepted [facility] Referral- will f/u with pt for [facility] date.    Final next level of care: [place] Barriers to Discharge: No Barriers Identified   Patient Goals and CMS Choice Patient states their goals for this hospitalization and ongoing recovery are:: to get better CMS Medicare.gov Compare Post Acute Care list provided to:: Patient Choice offered to / list presented to : Patient  Discharge Placement               Home with [facility]        Discharge Plan and Services In-house Referral: Clinical Social Work Discharge Planning Services: CM Consult Post Acute Care Choice: Home Health          DME Arranged: N/A         HH Arranged: PT, OT, Nurse's Aide [place] Agency: [place] ([facility]) Date HH Agency Contacted: 04/12/19 Time [place]: [id number] Representative spoke with at [place]: [place] ([place]) Interventions     Readmission Risk Interventions Readmission Risk Prevention Plan 04/15/2019 10/23/2018 08/20/2018  Transportation Screening Complete Complete -  PCP or Specialist Appt within 3-5 Days - Complete -  HRI or [place] - Complete -  Social Work Consult for [place] Planning/Counseling - Complete -  Palliative Care Screening - Not Applicable -  Medication Review Press photographer) Complete Complete -  PCP or Specialist appointment within 3-5 days of discharge Complete - Complete  HRI or Home Care  Consult Complete - Complete  SW Recovery Care/Counseling Consult Complete - Complete  Palliative Care Screening Not Applicable - Not [place] Patient Refused - Not Applicable  Some recent data might be hidden

## 2019-04-16 DIAGNOSIS — N186 End stage renal disease: Secondary | ICD-10-CM | POA: Diagnosis not present

## 2019-04-16 DIAGNOSIS — I129 Hypertensive chronic kidney disease with stage 1 through stage 4 chronic kidney disease, or unspecified chronic kidney disease: Secondary | ICD-10-CM | POA: Diagnosis not present

## 2019-04-16 DIAGNOSIS — Z992 Dependence on renal dialysis: Secondary | ICD-10-CM | POA: Diagnosis not present

## 2019-04-16 NOTE — Progress Notes (Addendum)
Progress Note    04/16/2019 7:22 AM 6 Days Post-Op  Subjective:  Discharge was cancelled last night because patient was feeling too weak and fatigued after dialysis.  He is ready for discharge home today.   Vitals:   04/16/19 0017 04/16/19 0558  BP: (!) 104/52 (!) 115/51  Pulse: 67 66  Resp: 17 20  Temp: 98.5 F (36.9 C) 98 F (36.7 C)  SpO2: 99% 99%   Physical Exam: Lungs:  Non labored Incisions: incisions of R LE c/d/i Extremities:  Palpable popliteal pulse RLE Neurologic: A&O  CBC    Component Value Date/Time   WBC 10.0 04/15/2019 0740   RBC 2.79 (L) 04/15/2019 0740   HGB 9.4 (L) 04/15/2019 0740   HGB 11.4 (L) 11/12/2018 1637   HGB 10.8 (L) 03/14/2017 0921   HCT 29.6 (L) 04/15/2019 0740   HCT 35.0 (L) 11/12/2018 1637   HCT 32.5 (L) 03/14/2017 0921   PLT 126 (L) 04/15/2019 0740   PLT 51 (LL) 11/12/2018 1637   MCV 106.1 (H) 04/15/2019 0740   MCV 103 (H) 11/12/2018 1637   MCV 106.2 (H) 03/14/2017 0921   MCH 33.7 04/15/2019 0740   MCHC 31.8 04/15/2019 0740   RDW 14.3 04/15/2019 0740   RDW 14.6 11/12/2018 1637   RDW 14.6 03/14/2017 0921   LYMPHSABS 1.0 11/12/2018 1637   LYMPHSABS 0.8 (L) 03/14/2017 0921   MONOABS 0.4 10/20/2018 0638   MONOABS 0.6 03/14/2017 0921   EOSABS 0.2 11/12/2018 1637   BASOSABS 0.0 11/12/2018 1637   BASOSABS 0.0 03/14/2017 0921    BMET    Component Value Date/Time   NA 137 04/15/2019 0741   NA 135 07/14/2018 0909   NA 141 03/14/2017 0921   K 4.5 04/15/2019 0741   K 4.1 03/14/2017 0921   CL 90 (L) 04/15/2019 0741   CO2 24 04/15/2019 0741   CO2 33 (H) 03/14/2017 0921   GLUCOSE 97 04/15/2019 0741   GLUCOSE 144 (H) 03/14/2017 0921   BUN 70 (H) 04/15/2019 0741   BUN 76 (HH) 07/14/2018 0909   BUN 30.1 (H) 03/14/2017 0921   CREATININE 8.44 (H) 04/15/2019 0741   CREATININE 9.16 (HH) 09/17/2017 0954   CREATININE 8.1 (HH) 03/14/2017 0921   CALCIUM 8.7 (L) 04/15/2019 0741   CALCIUM 9.5 03/14/2017 0921   GFRNONAA 6 (L) 04/15/2019  0741   GFRNONAA 5 (L) 09/17/2017 0954   GFRNONAA 24 (L) 06/30/2014 1528   GFRAA 7 (L) 04/15/2019 0741   GFRAA 6 (L) 09/17/2017 0954   GFRAA 28 (L) 06/30/2014 1528    INR    Component Value Date/Time   INR 1.3 (H) 10/16/2018 1045   INR 1.10 (L) 01/21/2015 0809     Intake/Output Summary (Last 24 hours) at 04/16/2019 7017 Last data filed at 04/15/2019 1046 Gross per 24 hour  Intake --  Output 1261 ml  Net -1261 ml     Assessment/Plan:  69 y.o. male is s/p R fem to BK popliteal bypass with vein 6 Days Post-Op   - Perfusing RLE well - Toes of R foot continue to demarcate; patient is aware of high risk for partial foot amp - discharge home today with [facility] PT - Follow up in 2 weeks with Dr. Rolanda Lundborg, PA-C [facility] [phone] 04/16/2019 7:22 AM  I have independently interviewed and examined patient and agree with PA assessment and plan above.  Will allow right foot to demarcate.  Possibly will need toe amputations in  the future.  Rishabh Rinkenberger C. Donzetta Matters, MD [facility] [place] Office: [phone] Pager: [phone]

## 2019-04-16 NOTE — Plan of Care (Signed)
  Problem: Education: Goal: Knowledge of General Education information will improve Description: Including pain rating scale, medication(s)/side effects and non-pharmacologic comfort measures 04/16/2019 0920 by Glenard Haring, RN Outcome: Adequate for Discharge 04/16/2019 0920 by Glenard Haring, RN Outcome: Adequate for Discharge

## 2019-04-16 NOTE — Discharge Summary (Signed)
Bypass Discharge Summary Patient ID: Jose Garrett [id number] 69 y.o. 1951/03/11  Admit date: 04/09/2019  Discharge date and time: 04/16/2019  9:56 AM   Admitting Physician: Waynetta Sandy, MD   Discharge Physician: Same  Admission Diagnoses: PAD (peripheral artery disease) [facility]) [I73.9]  Discharge Diagnoses: Same  Admission Condition: poor  Discharged Condition: fair  Indication for Admission: Critical limb ischemia of right lower extremity with tissue loss and rest pain  Hospital Course: Mr. Yandiel Bergum is a 69 year old male who was brought in as an outpatient due to critical limb ischemia of right lower extremity with tissue loss and rest pain.  He underwent diagnostic arteriogram by Dr. Donzetta Matters on 04/09/2019.  Based on angiography he then underwent right femoral to below the knee popliteal bypass with vein by Dr. Donnetta Hutching the following day.  He tolerated the procedure well and was kept in hospital.  Nephrology was consulted during hospital stay for management of end-stage renal disease on hemodialysis.  The majority of the hospital course consisted of increasing mobility and pain control.  He was evaluated by PT/OT who recommended skilled nursing facility placement.  The patient had a bypass on the contralateral leg last year and did well with home health physical therapy thus refused SNF placement and preferred home health physical therapy.  Over the course of his hospital stay incisions of right lower extremity remain clean dry and intact.  Tissue changes on the toes of his right foot and dorsal foot have darkened in color especially third and fourth toe.  Patient is aware he will likely require toe versus partial foot amputation in the future however we will allow time for demarcation.  He will follow-up in office with Dr. Donzetta Matters in about 2 weeks.  He will be prescribed 2 to 3 days of narcotic pain medication for continued postoperative pain control.  Discharge instructions were  reviewed with the patient and he voices understanding.  He was discharged home this morning with home health PT in stable condition.  Consults: nephrology  Treatments: surgery: Diagnostic right lower extremity arteriogram by Dr. Donzetta Matters on 04/09/2019 Right femoral to below the knee popliteal bypass with vein by Dr. Donnetta Hutching on 04/10/2019    Disposition: Discharge disposition: 01-Home or Self Care       - For [facility] Registry use ---  Post-op:  Wound infection: No  Graft infection: No  Transfusion: No  New Arrhythmia: No Patency judged by: [ ]  Dopper only, [x ] Palpable graft pulse, [ ]  Palpable distal pulse, [ ]  ABI inc. > 0.15, [ ]  Duplex D/C Ambulatory Status: Ambulatory with Assistance  Complications: MI: [x ] No, [ ]  Troponin only, [ ]  EKG or Clinical CHF: No Resp failure: [x ] none, [ ]  Pneumonia, [ ]  Ventilator Chg in renal function: [x ] none, [ ]  Inc. Cr > 0.5, [ ]  Temp. Dialysis, [ ]  Permanent dialysis Stroke: [ x] None, [ ]  Minor, [ ]  Major Return to OR: No  Reason for return to OR: [ ]  Bleeding, [ ]  Infection, [ ]  Thrombosis, [ ]  Revision  Discharge medications: Statin use:  Yes ASA use:  Yes Plavix use:  Yes Beta blocker use: Yes Coumadin use: No  for medical reason Not indicated    Patient Instructions:  Allergies as of 04/16/2019   No Known Allergies     Medication List    TAKE these medications   amiodarone 200 MG tablet Commonly known as: PACERONE Take 1 tablet (200 mg total) by mouth  daily.   atorvastatin 80 MG tablet Commonly known as: LIPITOR Take 1 tablet (80 mg total) by mouth daily with supper.   carvedilol 3.125 MG tablet Commonly known as: COREG Take 1 tablet (3.125 mg total) by mouth 2 (two) times daily.   clopidogrel 75 MG tablet Commonly known as: PLAVIX Take 1 tablet (75 mg total) by mouth daily with supper.   LORazepam 1 MG tablet Commonly known as: ATIVAN Take 1 mg by mouth 2 (two) times daily as needed for anxiety.     methocarbamol 500 MG tablet Commonly known as: ROBAXIN Take 500 mg by mouth 2 (two) times daily as needed for mild pain.   midodrine 10 MG tablet Commonly known as: PROAMATINE Take 1 tablet (10 mg total) by mouth every Monday, Wednesday, and Friday with hemodialysis.   multivitamin Tabs tablet Take 1 tablet by mouth at bedtime. What changed: when to take this   oxyCODONE 5 MG immediate release tablet Commonly known as: Oxy IR/ROXICODONE Take 1 tablet (5 mg total) by mouth every 6 (six) hours as needed for moderate pain. What changed:   how much to take  when to take this   sucroferric oxyhydroxide 500 MG chewable tablet Commonly known as: VELPHORO Chew 2 tablets (1,000 mg total) by mouth 3 (three) times daily with meals. What changed: how much to take   temazepam 15 MG capsule Commonly known as: RESTORIL Take 15 mg by mouth at bedtime as needed for sleep.   Tylenol Dissolve Packs 500 MG Pack Generic drug: Acetaminophen Place 500-1,000 mg under the tongue every 6 (six) hours as needed (for pain).   vitamin B-12 1000 MCG tablet Commonly known as: CYANOCOBALAMIN Take 1,000 mcg by mouth every evening.      Activity: activity as tolerated Diet: renal diet Wound Care: keep wound clean and dry  Follow-up with Dr. Donzetta Matters in 2 weeks.  SignedDagoberto Ligas 04/16/2019 10:45 AM

## 2019-04-16 NOTE — Care Management Important Message (Signed)
Important Message  Patient Details  Name: Jose Garrett MRN: [id number] Date of Birth: [birth year]   Medicare Important Message Given:  Yes     Thania Woodlief 04/16/2019, 10:00 AM

## 2019-04-16 NOTE — Progress Notes (Signed)
Discharge instructions given to [name].  Discussed new medications, medication changes and side effects.  Discussed activities and lifting restrictions.  Discussed follow up appointments.  Discussed signs and symptoms to watch for and when to contact the physician.  Verbalized understanding.  Verbalized understanding.

## 2019-04-17 ENCOUNTER — Telehealth: Payer: Self-pay | Admitting: Nephrology

## 2019-04-17 ENCOUNTER — Telehealth: Payer: Self-pay | Admitting: Family Medicine

## 2019-04-17 ENCOUNTER — Telehealth: Payer: Self-pay

## 2019-04-17 DIAGNOSIS — I70221 Atherosclerosis of native arteries of extremities with rest pain, right leg: Secondary | ICD-10-CM | POA: Diagnosis not present

## 2019-04-17 DIAGNOSIS — E859 Amyloidosis, unspecified: Secondary | ICD-10-CM | POA: Diagnosis not present

## 2019-04-17 DIAGNOSIS — N186 End stage renal disease: Secondary | ICD-10-CM | POA: Diagnosis not present

## 2019-04-17 DIAGNOSIS — I739 Peripheral vascular disease, unspecified: Secondary | ICD-10-CM | POA: Diagnosis not present

## 2019-04-17 DIAGNOSIS — I350 Nonrheumatic aortic (valve) stenosis: Secondary | ICD-10-CM | POA: Diagnosis not present

## 2019-04-17 DIAGNOSIS — I132 Hypertensive heart and chronic kidney disease with heart failure and with stage 5 chronic kidney disease, or end stage renal disease: Secondary | ICD-10-CM | POA: Diagnosis not present

## 2019-04-17 DIAGNOSIS — I255 Ischemic cardiomyopathy: Secondary | ICD-10-CM | POA: Diagnosis not present

## 2019-04-17 DIAGNOSIS — I5042 Chronic combined systolic (congestive) and diastolic (congestive) heart failure: Secondary | ICD-10-CM | POA: Diagnosis not present

## 2019-04-17 DIAGNOSIS — I2510 Atherosclerotic heart disease of native coronary artery without angina pectoris: Secondary | ICD-10-CM | POA: Diagnosis not present

## 2019-04-17 DIAGNOSIS — D631 Anemia in chronic kidney disease: Secondary | ICD-10-CM | POA: Diagnosis not present

## 2019-04-17 DIAGNOSIS — D696 Thrombocytopenia, unspecified: Secondary | ICD-10-CM | POA: Diagnosis not present

## 2019-04-17 DIAGNOSIS — Z48812 Encounter for surgical aftercare following surgery on the circulatory system: Secondary | ICD-10-CM | POA: Diagnosis not present

## 2019-04-17 DIAGNOSIS — N2581 Secondary hyperparathyroidism of renal origin: Secondary | ICD-10-CM | POA: Diagnosis not present

## 2019-04-17 DIAGNOSIS — E785 Hyperlipidemia, unspecified: Secondary | ICD-10-CM | POA: Diagnosis not present

## 2019-04-17 NOTE — Telephone Encounter (Signed)
Transition Care Management Follow-up Telephone Call  Date of discharge and from where: 04/16/2019  How have you been since you were released from the hospital? Pt stated feeling better  Any questions or concerns?   None verbalized at this time  Items Reviewed:  Did the pt receive and understand the discharge instructions provided?    YES  Medications obtained and verified?    Yes , Oxicodone meds daughter will pick up today from the pharmacy   Any new allergies since your discharge?    No    Dietary orders reviewed?    Yes. Renal Diet / patient aware   Do you have support at home?  Yes, sister in law, daughter and family  Other Pt has Twice per week  OT therapist coming to the house starting today  Functional Questionnaire: (I = Independent and D = Dependent)  ADLs: Yes   Bathing/Dressing- depending  Meal Prep- depending  Eating- independing  Maintaining continence- independent  Transferring/Ambulation- Dependent  Managing Meds- dependent  Follow up appointments reviewed:   PCP Hospital f/u appt confirmed?    Scheduled to see Dr Chapman Fitch on 04/22/2019@ 10:50am .  [facility] f/u appt confirmed?  Not Scheduled yet   Are transportation arrangements needed?   Yes/ family will drive when  needed   If their condition worsens, is the pt aware to call PCP or go to the Emergency Dept.?   Yes/patient is aware   Was the patient provided with contact information for the PCP's office or ED?  Yes  Was to pt encouraged to call back with questions or concerns?   Yes. Made pt aware / Call back nr and extension provided

## 2019-04-17 NOTE — Telephone Encounter (Signed)
Transition of Care Contact from [place]   Date of Discharge: 04/16/19 Date of Contact: 04/17/19 Method of contact: phone Talked to patient   Patient contacted to discuss transition of care form recent hospitaliztion. Patient was admitted to [facility] from 1/28 to 04/16/19 with the discharge diagnosis of critical limb ischemia of right lower extremity with tissue loss and rest pain s/p arteriogram and right femoral to below the knee popliteal bypass with vein.   Medication changes were reviewed with no changes noted.  Patient was due to follow up with is outpatient dialysis center today but missed his treatment because he was feeling "blah."  Discussed importance of compliance and to decrease his fluid intake and follow low K diet.   Other follow up needs include appointment with VVS next week.    Jen Mow, PA-C [facility] Pager: [phone]

## 2019-04-17 NOTE — Telephone Encounter (Signed)
[facility] call to request order for this Pt, please call Amber at [phone]

## 2019-04-20 DIAGNOSIS — N186 End stage renal disease: Secondary | ICD-10-CM | POA: Diagnosis not present

## 2019-04-20 DIAGNOSIS — Z992 Dependence on renal dialysis: Secondary | ICD-10-CM | POA: Diagnosis not present

## 2019-04-20 DIAGNOSIS — N2581 Secondary hyperparathyroidism of renal origin: Secondary | ICD-10-CM | POA: Diagnosis not present

## 2019-04-20 DIAGNOSIS — D688 Other specified coagulation defects: Secondary | ICD-10-CM | POA: Diagnosis not present

## 2019-04-21 DIAGNOSIS — I70221 Atherosclerosis of native arteries of extremities with rest pain, right leg: Secondary | ICD-10-CM | POA: Diagnosis not present

## 2019-04-21 DIAGNOSIS — N186 End stage renal disease: Secondary | ICD-10-CM | POA: Diagnosis not present

## 2019-04-21 DIAGNOSIS — Z48812 Encounter for surgical aftercare following surgery on the circulatory system: Secondary | ICD-10-CM | POA: Diagnosis not present

## 2019-04-21 DIAGNOSIS — I350 Nonrheumatic aortic (valve) stenosis: Secondary | ICD-10-CM | POA: Diagnosis not present

## 2019-04-21 DIAGNOSIS — N2581 Secondary hyperparathyroidism of renal origin: Secondary | ICD-10-CM | POA: Diagnosis not present

## 2019-04-21 DIAGNOSIS — E859 Amyloidosis, unspecified: Secondary | ICD-10-CM | POA: Diagnosis not present

## 2019-04-21 DIAGNOSIS — D696 Thrombocytopenia, unspecified: Secondary | ICD-10-CM | POA: Diagnosis not present

## 2019-04-21 DIAGNOSIS — E785 Hyperlipidemia, unspecified: Secondary | ICD-10-CM | POA: Diagnosis not present

## 2019-04-21 DIAGNOSIS — D631 Anemia in chronic kidney disease: Secondary | ICD-10-CM | POA: Diagnosis not present

## 2019-04-21 DIAGNOSIS — I5042 Chronic combined systolic (congestive) and diastolic (congestive) heart failure: Secondary | ICD-10-CM | POA: Diagnosis not present

## 2019-04-21 DIAGNOSIS — I255 Ischemic cardiomyopathy: Secondary | ICD-10-CM | POA: Diagnosis not present

## 2019-04-21 DIAGNOSIS — I132 Hypertensive heart and chronic kidney disease with heart failure and with stage 5 chronic kidney disease, or end stage renal disease: Secondary | ICD-10-CM | POA: Diagnosis not present

## 2019-04-21 DIAGNOSIS — I2510 Atherosclerotic heart disease of native coronary artery without angina pectoris: Secondary | ICD-10-CM | POA: Diagnosis not present

## 2019-04-22 ENCOUNTER — Ambulatory Visit: Payer: Medicare Other | Attending: Family Medicine | Admitting: Family Medicine

## 2019-04-22 ENCOUNTER — Encounter: Payer: Self-pay | Admitting: Family Medicine

## 2019-04-22 ENCOUNTER — Other Ambulatory Visit: Payer: Self-pay

## 2019-04-22 VITALS — BP 145/58 | HR 69 | Temp 97.3°F | Resp 16 | Wt 176.0 lb

## 2019-04-22 DIAGNOSIS — Z09 Encounter for follow-up examination after completed treatment for conditions other than malignant neoplasm: Secondary | ICD-10-CM | POA: Diagnosis not present

## 2019-04-22 DIAGNOSIS — L03115 Cellulitis of right lower limb: Secondary | ICD-10-CM | POA: Diagnosis not present

## 2019-04-22 DIAGNOSIS — I739 Peripheral vascular disease, unspecified: Secondary | ICD-10-CM | POA: Diagnosis not present

## 2019-04-22 DIAGNOSIS — Z789 Other specified health status: Secondary | ICD-10-CM

## 2019-04-22 DIAGNOSIS — T82898S Other specified complication of vascular prosthetic devices, implants and grafts, sequela: Secondary | ICD-10-CM | POA: Diagnosis not present

## 2019-04-22 DIAGNOSIS — D688 Other specified coagulation defects: Secondary | ICD-10-CM | POA: Diagnosis not present

## 2019-04-22 DIAGNOSIS — N186 End stage renal disease: Secondary | ICD-10-CM | POA: Diagnosis not present

## 2019-04-22 DIAGNOSIS — Z992 Dependence on renal dialysis: Secondary | ICD-10-CM | POA: Diagnosis not present

## 2019-04-22 DIAGNOSIS — N2581 Secondary hyperparathyroidism of renal origin: Secondary | ICD-10-CM | POA: Diagnosis not present

## 2019-04-22 MED ORDER — CEPHALEXIN 500 MG PO CAPS: 500.0000 mg | ORAL_CAPSULE | Freq: Two times a day (BID) | ORAL | 0 refills | Status: DC

## 2019-04-22 NOTE — Progress Notes (Signed)
Subjective:  Patient ID: Jose Garrett, male    DOB: [birth year]  Age: 69 y.o. MRN: [id number]  CC: Hospitalization Follow-up   HPI Jose Garrett presents for follow-up after hospitalization for vascular procedure.  He is status post right femoral-popliteal artery bypass graft secondary to peripheral arterial disease.  He has some mild continued leg discomfort status post surgical incisions to the right inner upper thigh area as well as to the right inner lower extremity.  He reports that he believes that the surgery went well.  He does have dialysis later today and in fact is supposed to be at dialysis now as he generally has dialysis between 11-11: 30 which last until 4-4:30.  His blood pressure is slightly elevated today but he reports that his blood pressure gets lower while he is undergoing dialysis and sometimes he has hypotension after dialysis.  He reports that he is taking his medications on a daily basis.  He has monitoring of his electrolytes and anemia at dialysis.  He continues to have some fatigue status post recent surgery.  He has noticed that his right lower extremity is swollen compared to the left but he has assumed that this was natural after surgery.  Patient has noticed some redness along the right lower leg/ankle but again thought that this might be normal after the surgery and might be related to the swelling.  He does not feel as if he has had any fever or chills, no increase in shortness of breath or cough.  He denies any abdominal pain-no nausea or vomiting.  He reports that he did have 1 follow-up appointment with vascular after his surgery however he was not given a follow-up appointment time and his efforts to call the office to find out when he has another appointment or to set up another visit have been unsuccessful.  Past Medical History:  Diagnosis Date  . Amyloidosis ([place])   . Anemia of chronic disease   . Anxiety   . CAD (coronary artery disease)    a. cardiac  cath on 11/25/17 with occlusion of the RCA which could have been his event but the RCA could not be engaged. The RCA filled distally from left to right collaterals. There were no severe stenoses in the left coronary system. Medical therapy recommended, started on plavix.  . Chronic combined systolic and diastolic CHF (congestive heart failure) ([place])    a. EF dropped to 35-40% with grade 2 DD by echo 11/2017.  [name] DJD (degenerative joint disease), lumbar   . ESRD (end stage renal disease) ([place])    ESRD- HEMO MWF- [facility]  . GERD (gastroesophageal reflux disease)   . Hyperlipidemia   . Hypertension   . Neuropathy   . Peripheral vascular disease ([place])   . Restless legs   . S/P TAVR (transcatheter aortic valve replacement) 10/21/2018   26 mm Edwards Sapien 3 Ultra transcatheter heart valve placed via percutaneous right transfemoral approach   . Severe aortic stenosis    severe    Past Surgical History:  Procedure Laterality Date  . ABDOMINAL AORTOGRAM W/LOWER EXTREMITY Bilateral 07/16/2018   Procedure: ABDOMINAL AORTOGRAM W/LOWER EXTREMITY;  Surgeon: Wellington Hampshire, MD;  Location: [place] [facility];  Service: Cardiovascular;  Laterality: Bilateral;  . ABDOMINAL AORTOGRAM W/LOWER EXTREMITY N/A 04/09/2019   Procedure: ABDOMINAL AORTOGRAM W/LOWER EXTREMITY - Right Lower;  Surgeon: Waynetta Sandy, MD;  Location: [place] [facility];  Service: Cardiovascular;  Laterality: N/A;  . AMPUTATION Left  08/19/2018   Procedure: AMPUTATION LEFT TOES SECOND AND THIRD;  Surgeon: Waynetta Sandy, MD;  Location: [place];  Service: Vascular;  Laterality: Left;  . AV FISTULA PLACEMENT Right 12/27/2015   Procedure: Right Arm ARTERIOVENOUS (AV) FISTULA CREATION;  Surgeon: Angelia Mould, MD;  Location: [place];  Service: Vascular;  Laterality: Right;  . CATARACT EXTRACTION W/ INTRAOCULAR LENS  IMPLANT, BILATERAL    . COLONOSCOPY    . ERCP W/ SPHICTEROTOMY    . FEMORAL-POPLITEAL BYPASS GRAFT  Left 07/22/2018   Procedure: left FEMORAL TO BELOW KNEE POPLITEAL ARTERY BYPASS GREAFT using 68mm removable ring propaten gore graft;  Surgeon: Waynetta Sandy, MD;  Location: [place];  Service: Vascular;  Laterality: Left;  . FEMORAL-POPLITEAL BYPASS GRAFT Right 04/10/2019   Procedure: BYPASS GRAFT RIGHT FEMORAL-POPLITEAL ARTERY;  Surgeon: Rosetta Posner, MD;  Location: [place];  Service: Vascular;  Laterality: Right;  . INTRAOPERATIVE TRANSTHORACIC ECHOCARDIOGRAM N/A 10/21/2018   Procedure: Intraoperative Transthoracic Echocardiogram;  Surgeon: Sherren Mocha, MD;  Location: [place];  Service: Open Heart Surgery;  Laterality: N/A;  . IR GENERIC HISTORICAL  01/27/2016   IR FLUORO GUIDE CV LINE RIGHT 01/27/2016 Arne Cleveland, MD [facility]  . IR GENERIC HISTORICAL  01/27/2016   IR US GUIDE VASC ACCESS RIGHT 01/27/2016 Arne Cleveland, MD [facility]  . LAPAROSCOPIC CHOLECYSTECTOMY  03/2011  . LAPAROSCOPIC CHOLECYSTECTOMY    . LEFT HEART CATH AND CORONARY ANGIOGRAPHY N/A 11/25/2017   Procedure: LEFT HEART CATH AND CORONARY ANGIOGRAPHY;  Surgeon: Martinique, Peter M, MD;  Location: [place] [facility];  Service: Cardiovascular;  Laterality: N/A;  . LEFT HEART CATH AND CORONARY ANGIOGRAPHY N/A 07/24/2018   Procedure: LEFT HEART CATH AND CORONARY ANGIOGRAPHY;  Surgeon: Sherren Mocha, MD;  Location: [place] [facility];  Service: Cardiovascular;  Laterality: N/A;  . RENAL BIOPSY, PERCUTANEOUS Right 09/08/2014  . RIGHT/LEFT HEART CATH AND CORONARY ANGIOGRAPHY N/A 03/25/2018   Procedure: RIGHT/LEFT HEART CATH AND CORONARY ANGIOGRAPHY;  Surgeon: Martinique, Peter M, MD;  Location: [facility];  Service: Cardiovascular;  Laterality: N/A;  . TOE AMPUTATION Left 08/19/2018   2nd & 3rd toes  . TONSILLECTOMY  ~ 1960  . TRANSCATHETER AORTIC VALVE REPLACEMENT, TRANSFEMORAL N/A 10/21/2018   Procedure: TRANSCATHETER AORTIC VALVE REPLACEMENT, TRANSFEMORAL;  Surgeon: Sherren Mocha, MD;  Location: [facility];   Service: Open Heart Surgery;  Laterality: N/A;  . ULTRASOUND GUIDANCE FOR VASCULAR ACCESS  03/25/2018   Procedure: Ultrasound Guidance For Vascular Access;  Surgeon: Martinique, Peter M, MD;  Location: [place] [facility];  Service: Cardiovascular;;    Family History  Problem Relation Age of Onset  . Hypertension Mother   . Diabetes Mother   . Heart disease Mother   . Skin cancer Mother   . Hypertension Father   . Diabetes Father   . Diabetes Sister     Social History   Tobacco Use  . Smoking status: Former Smoker    Packs/day: 2.00    Years: 40.00    Pack years: 80.00    Types: Cigarettes    Quit date: 03/19/2011    Years since quitting: 8.0  . Smokeless tobacco: Never Used  Substance Use Topics  . Alcohol use: No    Alcohol/week: 0.0 standard drinks    ROS Review of Systems  Constitutional: Positive for fatigue (Mild). Negative for chills and fever.  HENT: Negative for sore throat.   Respiratory: Positive for shortness of breath (No shortness of breath at rest but  can become short of breath with exertion). Negative for cough.   Cardiovascular: Positive for leg swelling. Negative for chest pain and palpitations.  Gastrointestinal: Negative for abdominal pain, constipation, diarrhea and nausea.  Endocrine: Positive for cold intolerance. Negative for polydipsia, polyphagia and polyuria.  Genitourinary: Negative for dysuria and frequency.  Musculoskeletal: Positive for arthralgias, back pain and gait problem.  Neurological: Negative for dizziness and headaches.  Hematological: Negative for adenopathy. Bruises/bleeds easily (Medication side effect).  Psychiatric/Behavioral: Negative for self-injury and suicidal ideas.    Objective:   Today's Vitals: BP (!) 145/58   Pulse 69   Temp (!) 97.3 F (36.3 C)   Resp 16   Wt 176 lb (79.8 kg)   SpO2 96%   BMI 25.25 kg/m   Physical Exam Vitals and nursing note reviewed.  Constitutional:      Appearance: Normal appearance.      Comments: Thin appearing older male in no acute distress but with visible edema to the right lower extremity as well as erythema to the ankle and right lower extremity are noticeable as patient is wearing shorts.  Patient is sitting on a chair in the exam room and has his walker nearby.  Patient with visible postsurgical healing wound on the medial right lower extremity  Neck:     Comments: No JVD Cardiovascular:     Rate and Rhythm: Normal rate and regular rhythm.     Comments: Holosystolic murmur Pulmonary:     Effort: Pulmonary effort is normal.     Breath sounds: Normal breath sounds.  Abdominal:     Palpations: Abdomen is soft.     Tenderness: There is no abdominal tenderness. There is no right CVA tenderness, left CVA tenderness, guarding or rebound.  Musculoskeletal:        General: Swelling present.     Cervical back: Neck supple.     Right lower leg: Edema present.     Left lower leg: No edema.  Lymphadenopathy:     Cervical: No cervical adenopathy.  Skin:    General: Skin is warm and dry.     Findings: Erythema present.     Comments: Patient with postsurgical linear vertical healing incision sites at the right inner upper leg and right lower inner leg.  At the inferior end of the right lower leg incision patient with small area of erythema which extends a few inches down and then becomes confluent around the left ankle.  Patient with edema of the entire right lower leg/ankle and foot.  Skin in the area of redness is slightly warm to touch compared to surrounding skin.  Patient with very dry skin on the bilateral arms and repeatedly rubs at his arms while he is in the exam room  Neurological:     General: No focal deficit present.     Mental Status: He is alert and oriented to person, place, and time.  Psychiatric:        Mood and Affect: Mood normal.        Behavior: Behavior normal.     Assessment & Plan:  1. Cellulitis of right lower leg; 2.  Hospital discharge  follow-up;3.  Peripheral arterial disease; 4.  Medically complex patient; 5. status post right femoral-popliteal bypass Discussed with patient that he appears to have cellulitis of the right lower leg status post his recent hospitalization for right femoral-popliteal bypass.  He will be placed on Keflex 500 mg twice daily.  He has been asked to watch the area  of redness and if he has increased redness, continue swelling he needs to return in a few days for evaluation however if he notices marked increase in area of redness, pain, fever and chills or onset of leg pain he needs to go to the emergency department for further evaluation.  Referral is also being placed to vascular surgery as patient states that he has been unable to contact the office by phone to find out when he is scheduled for follow-up or to obtain a return appointment.  Keflex dose of 50 mg twice daily due to patient's renal failure requiring dialysis. - cephALEXin (KEFLEX) 500 MG capsule; Take 1 capsule (500 mg total) by mouth 2 (two) times daily.  Dispense: 20 capsule; Refill: 0 - Ambulatory referral to Vascular Surgery    Outpatient Encounter Medications as of 04/22/2019  Medication Sig  . Acetaminophen (TYLENOL DISSOLVE PACKS) 500 MG PACK Place 500-1,000 mg under the tongue every 6 (six) hours as needed (for pain).  [name] amiodarone (PACERONE) 200 MG tablet Take 1 tablet (200 mg total) by mouth daily.  [name] atorvastatin (LIPITOR) 80 MG tablet Take 1 tablet (80 mg total) by mouth daily with supper.  . carvedilol (COREG) 3.125 MG tablet Take 1 tablet (3.125 mg total) by mouth 2 (two) times daily.  . clopidogrel (PLAVIX) 75 MG tablet Take 1 tablet (75 mg total) by mouth daily with supper.  [name] LORazepam (ATIVAN) 1 MG tablet Take 1 mg by mouth 2 (two) times daily as needed for anxiety.  . methocarbamol (ROBAXIN) 500 MG tablet Take 500 mg by mouth 2 (two) times daily as needed for mild pain.  . midodrine (PROAMATINE) 10 MG tablet Take 1 tablet  (10 mg total) by mouth every Monday, Wednesday, and Friday with hemodialysis.  [name] multivitamin (RENA-VIT) TABS tablet Take 1 tablet by mouth at bedtime. (Patient taking differently: Take 1 tablet by mouth daily with breakfast. )  . oxyCODONE (OXY IR/ROXICODONE) 5 MG immediate release tablet Take 1 tablet (5 mg total) by mouth every 6 (six) hours as needed for moderate pain.  . sucroferric oxyhydroxide (VELPHORO) 500 MG chewable tablet Chew 2 tablets (1,000 mg total) by mouth 3 (three) times daily with meals. (Patient taking differently: Chew 1,250 mg by mouth 3 (three) times daily with meals. )  . temazepam (RESTORIL) 15 MG capsule Take 15 mg by mouth at bedtime as needed for sleep.  . vitamin B-12 (CYANOCOBALAMIN) 1000 MCG tablet Take 1,000 mcg by mouth every evening.    No facility-administered encounter medications on file as of 04/22/2019.    An After Visit Summary was printed and given to the patient.   Follow-up: Return for 1 week if not seen by vascular; ED if worse.    [name] MD

## 2019-04-22 NOTE — Patient Instructions (Signed)
Try Sarna as an anti-itching lotion; also try Aveeno or the store brand

## 2019-04-23 ENCOUNTER — Other Ambulatory Visit: Payer: Self-pay

## 2019-04-23 ENCOUNTER — Ambulatory Visit (INDEPENDENT_AMBULATORY_CARE_PROVIDER_SITE_OTHER): Payer: Self-pay | Admitting: Physician Assistant

## 2019-04-23 VITALS — BP 121/61 | HR 78 | Temp 98.0°F | Resp 16 | Ht 69.0 in | Wt 173.5 lb

## 2019-04-23 DIAGNOSIS — I2510 Atherosclerotic heart disease of native coronary artery without angina pectoris: Secondary | ICD-10-CM | POA: Diagnosis not present

## 2019-04-23 DIAGNOSIS — N2581 Secondary hyperparathyroidism of renal origin: Secondary | ICD-10-CM | POA: Diagnosis not present

## 2019-04-23 DIAGNOSIS — Z992 Dependence on renal dialysis: Secondary | ICD-10-CM

## 2019-04-23 DIAGNOSIS — I350 Nonrheumatic aortic (valve) stenosis: Secondary | ICD-10-CM | POA: Diagnosis not present

## 2019-04-23 DIAGNOSIS — I70221 Atherosclerosis of native arteries of extremities with rest pain, right leg: Secondary | ICD-10-CM | POA: Diagnosis not present

## 2019-04-23 DIAGNOSIS — D631 Anemia in chronic kidney disease: Secondary | ICD-10-CM | POA: Diagnosis not present

## 2019-04-23 DIAGNOSIS — N186 End stage renal disease: Secondary | ICD-10-CM | POA: Diagnosis not present

## 2019-04-23 DIAGNOSIS — E859 Amyloidosis, unspecified: Secondary | ICD-10-CM | POA: Diagnosis not present

## 2019-04-23 DIAGNOSIS — D696 Thrombocytopenia, unspecified: Secondary | ICD-10-CM | POA: Diagnosis not present

## 2019-04-23 DIAGNOSIS — E785 Hyperlipidemia, unspecified: Secondary | ICD-10-CM | POA: Diagnosis not present

## 2019-04-23 DIAGNOSIS — I5042 Chronic combined systolic (congestive) and diastolic (congestive) heart failure: Secondary | ICD-10-CM | POA: Diagnosis not present

## 2019-04-23 DIAGNOSIS — I255 Ischemic cardiomyopathy: Secondary | ICD-10-CM | POA: Diagnosis not present

## 2019-04-23 DIAGNOSIS — I132 Hypertensive heart and chronic kidney disease with heart failure and with stage 5 chronic kidney disease, or end stage renal disease: Secondary | ICD-10-CM | POA: Diagnosis not present

## 2019-04-23 DIAGNOSIS — Z48812 Encounter for surgical aftercare following surgery on the circulatory system: Secondary | ICD-10-CM | POA: Diagnosis not present

## 2019-04-23 DIAGNOSIS — I739 Peripheral vascular disease, unspecified: Secondary | ICD-10-CM

## 2019-04-23 NOTE — Progress Notes (Signed)
    Postoperative Visit   History of Present Illness   KEAGEN HEINLEN is a 69 y.o. year old male who presents for postoperative follow-up for evaluation of toes of right foot.  He is 2 weeks status post right femoral to above-the-knee popliteal artery bypass with vein by Dr. Donzetta Matters.  His toes have further demarcated and based on the worsening appearance he asked to be seen in office.  He denies any significant pain to his foot.  Patient is more concerned about the appearance.  He is taking his aspirin and Plavix daily.  He states the incisions of his right leg are healing well.  Past medical history also significant for end-stage renal disease on hemodialysis which she is on a Monday Wednesday Friday schedule.  He is ambulating with a walker today.    For VQI Use Only   PRE-ADM LIVING: Home  AMB STATUS: Ambulatory with Assistance   Physical Examination   Vitals:   04/23/19 1149  BP: 121/61  Pulse: 78  Resp: 16  Temp: 98 F (36.7 C)  SpO2: 96%  Weight: 173 lb 8 oz (78.7 kg)  Height: 5\' 9"  (1.753 m)   General: NAD Lungs: Nonlabored breathing; CTA B Heart: RRR Abdomen: Soft nontender RLE: Incisions are healing well; pitting edema right leg to the level of the proximal shin; right foot well perfused with AT and PT by doppler; dry gangrene toes 2 through 4, superficial skin sloughing dorsal foot with mild erythema extending to midfoot; gangrene is dry; plantar surface all viable Neuro: A& O  Medical Decision Making   THALES KNIPPLE is a 69 y.o. year old male who presents status post right femoral to below the knee popliteal artery bypass with vein with further demarcation of toes of right foot.   Right foot well perfused with brisk AT and PT Doppler signals  All incisions of right lower extremity healing well  Patient concerned with the appearance with further demarcation of the toes of his right foot especially toes 2 through 4 and would prefer amputation rather than allowing  more time for demarcation  Offered patient amputations toes 2 through 4 versus TMA tomorrow with first available surgeon or next Wednesday with Dr. Donzetta Matters.  Patient preferred to wait for next Wednesday with Dr. Donzetta Matters  He can continue the Keflex prescription up to the day of surgery  Betadine applied to gangrenous toes and 2 x 2 gauze was placed between each webspace  Risks and benefits of TMA versus 2-4 toe amputation on the right discussed with the patient and he agrees to proceed this coming Wednesday with Dr. Donzetta Matters.   Dagoberto Ligas PA-C [facility] Office: [phone]  Clinic MD: Scot Dock

## 2019-04-24 DIAGNOSIS — N186 End stage renal disease: Secondary | ICD-10-CM | POA: Diagnosis not present

## 2019-04-24 DIAGNOSIS — I350 Nonrheumatic aortic (valve) stenosis: Secondary | ICD-10-CM | POA: Diagnosis not present

## 2019-04-24 DIAGNOSIS — Z48812 Encounter for surgical aftercare following surgery on the circulatory system: Secondary | ICD-10-CM | POA: Diagnosis not present

## 2019-04-24 DIAGNOSIS — I70221 Atherosclerosis of native arteries of extremities with rest pain, right leg: Secondary | ICD-10-CM | POA: Diagnosis not present

## 2019-04-24 DIAGNOSIS — E859 Amyloidosis, unspecified: Secondary | ICD-10-CM | POA: Diagnosis not present

## 2019-04-24 DIAGNOSIS — D688 Other specified coagulation defects: Secondary | ICD-10-CM | POA: Diagnosis not present

## 2019-04-24 DIAGNOSIS — I2510 Atherosclerotic heart disease of native coronary artery without angina pectoris: Secondary | ICD-10-CM | POA: Diagnosis not present

## 2019-04-24 DIAGNOSIS — I739 Peripheral vascular disease, unspecified: Secondary | ICD-10-CM | POA: Diagnosis not present

## 2019-04-24 DIAGNOSIS — D696 Thrombocytopenia, unspecified: Secondary | ICD-10-CM | POA: Diagnosis not present

## 2019-04-24 DIAGNOSIS — I5042 Chronic combined systolic (congestive) and diastolic (congestive) heart failure: Secondary | ICD-10-CM | POA: Diagnosis not present

## 2019-04-24 DIAGNOSIS — D631 Anemia in chronic kidney disease: Secondary | ICD-10-CM | POA: Diagnosis not present

## 2019-04-24 DIAGNOSIS — N2581 Secondary hyperparathyroidism of renal origin: Secondary | ICD-10-CM | POA: Diagnosis not present

## 2019-04-24 DIAGNOSIS — I132 Hypertensive heart and chronic kidney disease with heart failure and with stage 5 chronic kidney disease, or end stage renal disease: Secondary | ICD-10-CM | POA: Diagnosis not present

## 2019-04-24 DIAGNOSIS — Z992 Dependence on renal dialysis: Secondary | ICD-10-CM | POA: Diagnosis not present

## 2019-04-24 DIAGNOSIS — I998 Other disorder of circulatory system: Secondary | ICD-10-CM | POA: Diagnosis not present

## 2019-04-24 DIAGNOSIS — E785 Hyperlipidemia, unspecified: Secondary | ICD-10-CM | POA: Diagnosis not present

## 2019-04-24 DIAGNOSIS — I255 Ischemic cardiomyopathy: Secondary | ICD-10-CM | POA: Diagnosis not present

## 2019-04-25 ENCOUNTER — Other Ambulatory Visit (HOSPITAL_COMMUNITY)
Admission: RE | Admit: 2019-04-25 | Discharge: 2019-04-25 | Disposition: A | Discharge status: Home / Self Care | Payer: Medicare Other | Source: Ambulatory Visit | Attending: Vascular Surgery | Admitting: Vascular Surgery

## 2019-04-25 DIAGNOSIS — Z20822 Contact with and (suspected) exposure to covid-19: Secondary | ICD-10-CM | POA: Diagnosis not present

## 2019-04-25 DIAGNOSIS — Z01812 Encounter for preprocedural laboratory examination: Secondary | ICD-10-CM | POA: Diagnosis not present

## 2019-04-25 LAB — SARS CORONAVIRUS 2 (TAT 6-24 HRS): SARS Coronavirus 2: NEGATIVE

## 2019-04-27 ENCOUNTER — Inpatient Hospital Stay: Payer: Medicare Other | Admitting: Family Medicine

## 2019-04-27 DIAGNOSIS — N186 End stage renal disease: Secondary | ICD-10-CM | POA: Diagnosis not present

## 2019-04-27 DIAGNOSIS — Z992 Dependence on renal dialysis: Secondary | ICD-10-CM | POA: Diagnosis not present

## 2019-04-27 DIAGNOSIS — I2510 Atherosclerotic heart disease of native coronary artery without angina pectoris: Secondary | ICD-10-CM | POA: Diagnosis not present

## 2019-04-27 DIAGNOSIS — E785 Hyperlipidemia, unspecified: Secondary | ICD-10-CM | POA: Diagnosis not present

## 2019-04-27 DIAGNOSIS — Z48812 Encounter for surgical aftercare following surgery on the circulatory system: Secondary | ICD-10-CM | POA: Diagnosis not present

## 2019-04-27 DIAGNOSIS — I255 Ischemic cardiomyopathy: Secondary | ICD-10-CM | POA: Diagnosis not present

## 2019-04-27 DIAGNOSIS — I5042 Chronic combined systolic (congestive) and diastolic (congestive) heart failure: Secondary | ICD-10-CM | POA: Diagnosis not present

## 2019-04-27 DIAGNOSIS — I132 Hypertensive heart and chronic kidney disease with heart failure and with stage 5 chronic kidney disease, or end stage renal disease: Secondary | ICD-10-CM | POA: Diagnosis not present

## 2019-04-27 DIAGNOSIS — D631 Anemia in chronic kidney disease: Secondary | ICD-10-CM | POA: Diagnosis not present

## 2019-04-27 DIAGNOSIS — I350 Nonrheumatic aortic (valve) stenosis: Secondary | ICD-10-CM | POA: Diagnosis not present

## 2019-04-27 DIAGNOSIS — E859 Amyloidosis, unspecified: Secondary | ICD-10-CM | POA: Diagnosis not present

## 2019-04-27 DIAGNOSIS — D688 Other specified coagulation defects: Secondary | ICD-10-CM | POA: Diagnosis not present

## 2019-04-27 DIAGNOSIS — N2581 Secondary hyperparathyroidism of renal origin: Secondary | ICD-10-CM | POA: Diagnosis not present

## 2019-04-27 DIAGNOSIS — D696 Thrombocytopenia, unspecified: Secondary | ICD-10-CM | POA: Diagnosis not present

## 2019-04-27 DIAGNOSIS — I70221 Atherosclerosis of native arteries of extremities with rest pain, right leg: Secondary | ICD-10-CM | POA: Diagnosis not present

## 2019-04-28 ENCOUNTER — Encounter (HOSPITAL_COMMUNITY): Payer: Self-pay | Admitting: Vascular Surgery

## 2019-04-28 ENCOUNTER — Encounter: Payer: Medicare Other | Admitting: Vascular Surgery

## 2019-04-28 ENCOUNTER — Ambulatory Visit: Payer: Medicare Other | Admitting: Podiatry

## 2019-04-28 DIAGNOSIS — E859 Amyloidosis, unspecified: Secondary | ICD-10-CM | POA: Diagnosis not present

## 2019-04-28 DIAGNOSIS — I2510 Atherosclerotic heart disease of native coronary artery without angina pectoris: Secondary | ICD-10-CM | POA: Diagnosis not present

## 2019-04-28 DIAGNOSIS — I132 Hypertensive heart and chronic kidney disease with heart failure and with stage 5 chronic kidney disease, or end stage renal disease: Secondary | ICD-10-CM | POA: Diagnosis not present

## 2019-04-28 DIAGNOSIS — N2581 Secondary hyperparathyroidism of renal origin: Secondary | ICD-10-CM | POA: Diagnosis not present

## 2019-04-28 DIAGNOSIS — D631 Anemia in chronic kidney disease: Secondary | ICD-10-CM | POA: Diagnosis not present

## 2019-04-28 DIAGNOSIS — I70221 Atherosclerosis of native arteries of extremities with rest pain, right leg: Secondary | ICD-10-CM | POA: Diagnosis not present

## 2019-04-28 DIAGNOSIS — D696 Thrombocytopenia, unspecified: Secondary | ICD-10-CM | POA: Diagnosis not present

## 2019-04-28 DIAGNOSIS — I5042 Chronic combined systolic (congestive) and diastolic (congestive) heart failure: Secondary | ICD-10-CM | POA: Diagnosis not present

## 2019-04-28 DIAGNOSIS — I350 Nonrheumatic aortic (valve) stenosis: Secondary | ICD-10-CM | POA: Diagnosis not present

## 2019-04-28 DIAGNOSIS — N186 End stage renal disease: Secondary | ICD-10-CM | POA: Diagnosis not present

## 2019-04-28 DIAGNOSIS — Z48812 Encounter for surgical aftercare following surgery on the circulatory system: Secondary | ICD-10-CM | POA: Diagnosis not present

## 2019-04-28 DIAGNOSIS — I255 Ischemic cardiomyopathy: Secondary | ICD-10-CM | POA: Diagnosis not present

## 2019-04-28 DIAGNOSIS — E785 Hyperlipidemia, unspecified: Secondary | ICD-10-CM | POA: Diagnosis not present

## 2019-04-28 NOTE — Progress Notes (Signed)
Mr Jose Garrett denies chest pain or shortness of breath. Patient tested negative for Covid 04/25/2019, he has been at home in quarantine with family, except to go to dialysis Monday.  Mr Jose Garrett was instructed to stop Plavix 3 days before surgery, patient forgot and continue to take Plavix, including today. I called Dr.Cain's office and spoke to a call service, Dr. Scot Dock was on call . Dr. Scot Dock called, I informed of patient's surgery and that patient continued to take Plavix, including today. Dr Scot Dock said it is ok and patient can have surgery as planned.

## 2019-04-28 NOTE — Anesthesia Preprocedure Evaluation (Addendum)
Anesthesia Evaluation  Patient identified by MRN, date of birth, ID band Patient awake    Reviewed: Allergy & Precautions, NPO status , Patient's Chart, lab work & pertinent test results, reviewed documented beta blocker date and time   History of Anesthesia Complications Negative for: history of anesthetic complications  Airway Mallampati: II  TM Distance: >3 FB Neck ROM: Full    Dental  (+) Dental Advisory Given   Pulmonary former smoker,    Pulmonary exam normal        Cardiovascular hypertension, Pt. on home beta blockers and Pt. on medications + CAD, + Past MI, + Peripheral Vascular Disease and +CHF  Normal cardiovascular exam+ Valvular Problems/Murmurs (s/p TAVR)    '20 TTE - EF 35-40%. Mildly increased left ventricular wall thickness. Left ventricular diastolic Doppler parameters are consistent with pseudonormalization. LA was mildly dilated. Mild-moderate MR. A Edwards Sapien bioprosthetic aortic valve (TAVR) valve is present in  the aortic position with no stenosis.    Neuro/Psych PSYCHIATRIC DISORDERS Anxiety  RLS   Neuromuscular disease (neuropathy)    GI/Hepatic Neg liver ROS, GERD  Controlled,  Endo/Other  negative endocrine ROS  Renal/GU ESRF and DialysisRenal disease     Musculoskeletal  (+) Arthritis ,   Abdominal   Peds  Hematology  (+) anemia ,  Thrombocytopenia    Anesthesia Other Findings Amyloidosis Covid neg 04/25/19   Reproductive/Obstetrics                            Anesthesia Physical Anesthesia Plan  ASA: III  Anesthesia Plan: MAC and Regional   Post-op Pain Management:    Induction: Intravenous  PONV Risk Score and Plan: 2 and Propofol infusion and Treatment may vary due to age or medical condition  Airway Management Planned: Natural Airway and Simple Face Mask  Additional Equipment: None  Intra-op Plan:   Post-operative Plan:   Informed  Consent: I have reviewed the patients History and Physical, chart, labs and discussed the procedure including the risks, benefits and alternatives for the proposed anesthesia with the patient or authorized representative who has indicated his/her understanding and acceptance.       Plan Discussed with: CRNA and Anesthesiologist  Anesthesia Plan Comments:        Anesthesia Quick Evaluation

## 2019-04-29 ENCOUNTER — Other Ambulatory Visit: Payer: Self-pay

## 2019-04-29 ENCOUNTER — Encounter (HOSPITAL_COMMUNITY)
Admission: AD | Disposition: A | Discharge status: Rehabilitation Facility | Payer: Self-pay | Source: Home / Self Care | Attending: Vascular Surgery

## 2019-04-29 ENCOUNTER — Ambulatory Visit (HOSPITAL_COMMUNITY): Payer: Medicare Other | Admitting: Certified Registered"

## 2019-04-29 ENCOUNTER — Inpatient Hospital Stay (HOSPITAL_COMMUNITY)
Admission: AD | Admit: 2019-04-29 | Discharge: 2019-05-18 | DRG: 239 | Disposition: A | Discharge status: Rehabilitation Facility | Payer: Medicare Other | Attending: Vascular Surgery | Admitting: Vascular Surgery

## 2019-04-29 ENCOUNTER — Encounter (HOSPITAL_COMMUNITY): Payer: Self-pay | Admitting: Vascular Surgery

## 2019-04-29 DIAGNOSIS — I951 Orthostatic hypotension: Secondary | ICD-10-CM | POA: Diagnosis not present

## 2019-04-29 DIAGNOSIS — Z4781 Encounter for orthopedic aftercare following surgical amputation: Secondary | ICD-10-CM | POA: Diagnosis not present

## 2019-04-29 DIAGNOSIS — K219 Gastro-esophageal reflux disease without esophagitis: Secondary | ICD-10-CM | POA: Diagnosis present

## 2019-04-29 DIAGNOSIS — Z7902 Long term (current) use of antithrombotics/antiplatelets: Secondary | ICD-10-CM

## 2019-04-29 DIAGNOSIS — I2510 Atherosclerotic heart disease of native coronary artery without angina pectoris: Secondary | ICD-10-CM | POA: Diagnosis present

## 2019-04-29 DIAGNOSIS — N186 End stage renal disease: Secondary | ICD-10-CM | POA: Diagnosis present

## 2019-04-29 DIAGNOSIS — R7881 Bacteremia: Secondary | ICD-10-CM | POA: Diagnosis not present

## 2019-04-29 DIAGNOSIS — I70261 Atherosclerosis of native arteries of extremities with gangrene, right leg: Secondary | ICD-10-CM | POA: Diagnosis not present

## 2019-04-29 DIAGNOSIS — T8781 Dehiscence of amputation stump: Secondary | ICD-10-CM | POA: Diagnosis not present

## 2019-04-29 DIAGNOSIS — Z952 Presence of prosthetic heart valve: Secondary | ICD-10-CM

## 2019-04-29 DIAGNOSIS — Z833 Family history of diabetes mellitus: Secondary | ICD-10-CM | POA: Diagnosis not present

## 2019-04-29 DIAGNOSIS — I5042 Chronic combined systolic (congestive) and diastolic (congestive) heart failure: Secondary | ICD-10-CM | POA: Diagnosis not present

## 2019-04-29 DIAGNOSIS — Z953 Presence of xenogenic heart valve: Secondary | ICD-10-CM

## 2019-04-29 DIAGNOSIS — I252 Old myocardial infarction: Secondary | ICD-10-CM

## 2019-04-29 DIAGNOSIS — Z808 Family history of malignant neoplasm of other organs or systems: Secondary | ICD-10-CM | POA: Diagnosis not present

## 2019-04-29 DIAGNOSIS — Z89511 Acquired absence of right leg below knee: Secondary | ICD-10-CM | POA: Diagnosis not present

## 2019-04-29 DIAGNOSIS — E859 Amyloidosis, unspecified: Secondary | ICD-10-CM | POA: Diagnosis not present

## 2019-04-29 DIAGNOSIS — Z87891 Personal history of nicotine dependence: Secondary | ICD-10-CM | POA: Diagnosis not present

## 2019-04-29 DIAGNOSIS — I96 Gangrene, not elsewhere classified: Secondary | ICD-10-CM | POA: Diagnosis not present

## 2019-04-29 DIAGNOSIS — D631 Anemia in chronic kidney disease: Secondary | ICD-10-CM | POA: Diagnosis not present

## 2019-04-29 DIAGNOSIS — F419 Anxiety disorder, unspecified: Secondary | ICD-10-CM | POA: Diagnosis present

## 2019-04-29 DIAGNOSIS — I132 Hypertensive heart and chronic kidney disease with heart failure and with stage 5 chronic kidney disease, or end stage renal disease: Secondary | ICD-10-CM | POA: Diagnosis not present

## 2019-04-29 DIAGNOSIS — N2581 Secondary hyperparathyroidism of renal origin: Secondary | ICD-10-CM | POA: Diagnosis not present

## 2019-04-29 DIAGNOSIS — E8889 Other specified metabolic disorders: Secondary | ICD-10-CM | POA: Diagnosis not present

## 2019-04-29 DIAGNOSIS — L8990 Pressure ulcer of unspecified site, unspecified stage: Secondary | ICD-10-CM | POA: Insufficient documentation

## 2019-04-29 DIAGNOSIS — D649 Anemia, unspecified: Secondary | ICD-10-CM | POA: Diagnosis not present

## 2019-04-29 DIAGNOSIS — I700 Atherosclerosis of aorta: Secondary | ICD-10-CM | POA: Diagnosis not present

## 2019-04-29 DIAGNOSIS — D62 Acute posthemorrhagic anemia: Secondary | ICD-10-CM | POA: Diagnosis not present

## 2019-04-29 DIAGNOSIS — Z8249 Family history of ischemic heart disease and other diseases of the circulatory system: Secondary | ICD-10-CM | POA: Diagnosis not present

## 2019-04-29 DIAGNOSIS — E785 Hyperlipidemia, unspecified: Secondary | ICD-10-CM | POA: Diagnosis not present

## 2019-04-29 DIAGNOSIS — G479 Sleep disorder, unspecified: Secondary | ICD-10-CM | POA: Diagnosis not present

## 2019-04-29 DIAGNOSIS — R112 Nausea with vomiting, unspecified: Secondary | ICD-10-CM

## 2019-04-29 DIAGNOSIS — T8789 Other complications of amputation stump: Secondary | ICD-10-CM | POA: Diagnosis not present

## 2019-04-29 DIAGNOSIS — G2581 Restless legs syndrome: Secondary | ICD-10-CM | POA: Diagnosis present

## 2019-04-29 DIAGNOSIS — B952 Enterococcus as the cause of diseases classified elsewhere: Secondary | ICD-10-CM

## 2019-04-29 DIAGNOSIS — Z89431 Acquired absence of right foot: Secondary | ICD-10-CM | POA: Diagnosis not present

## 2019-04-29 DIAGNOSIS — I739 Peripheral vascular disease, unspecified: Secondary | ICD-10-CM | POA: Diagnosis not present

## 2019-04-29 DIAGNOSIS — R110 Nausea: Secondary | ICD-10-CM | POA: Diagnosis not present

## 2019-04-29 DIAGNOSIS — Z992 Dependence on renal dialysis: Secondary | ICD-10-CM | POA: Diagnosis not present

## 2019-04-29 DIAGNOSIS — R509 Fever, unspecified: Secondary | ICD-10-CM | POA: Diagnosis not present

## 2019-04-29 DIAGNOSIS — Z79899 Other long term (current) drug therapy: Secondary | ICD-10-CM | POA: Diagnosis not present

## 2019-04-29 DIAGNOSIS — I5043 Acute on chronic combined systolic (congestive) and diastolic (congestive) heart failure: Secondary | ICD-10-CM | POA: Diagnosis not present

## 2019-04-29 DIAGNOSIS — R197 Diarrhea, unspecified: Secondary | ICD-10-CM | POA: Diagnosis not present

## 2019-04-29 DIAGNOSIS — I081 Rheumatic disorders of both mitral and tricuspid valves: Secondary | ICD-10-CM | POA: Diagnosis not present

## 2019-04-29 DIAGNOSIS — M47816 Spondylosis without myelopathy or radiculopathy, lumbar region: Secondary | ICD-10-CM | POA: Diagnosis not present

## 2019-04-29 DIAGNOSIS — A4181 Sepsis due to Enterococcus: Secondary | ICD-10-CM | POA: Diagnosis not present

## 2019-04-29 DIAGNOSIS — D696 Thrombocytopenia, unspecified: Secondary | ICD-10-CM | POA: Diagnosis not present

## 2019-04-29 DIAGNOSIS — Z20822 Contact with and (suspected) exposure to covid-19: Secondary | ICD-10-CM | POA: Diagnosis present

## 2019-04-29 DIAGNOSIS — Y835 Amputation of limb(s) as the cause of abnormal reaction of the patient, or of later complication, without mention of misadventure at the time of the procedure: Secondary | ICD-10-CM | POA: Diagnosis not present

## 2019-04-29 DIAGNOSIS — R011 Cardiac murmur, unspecified: Secondary | ICD-10-CM | POA: Diagnosis not present

## 2019-04-29 HISTORY — PX: TRANSMETATARSAL AMPUTATION: SHX6197

## 2019-04-29 LAB — CBC
HCT: 32.1 % — ABNORMAL LOW (ref 39.0–52.0)
Hemoglobin: 10.3 g/dL — ABNORMAL LOW (ref 13.0–17.0)
MCH: 33.9 pg (ref 26.0–34.0)
MCHC: 32.1 g/dL (ref 30.0–36.0)
MCV: 105.6 fL — ABNORMAL HIGH (ref 80.0–100.0)
Platelets: 134 10*3/uL — ABNORMAL LOW (ref 150–400)
RBC: 3.04 MIL/uL — ABNORMAL LOW (ref 4.22–5.81)
RDW: 14 % (ref 11.5–15.5)
WBC: 10 10*3/uL (ref 4.0–10.5)
nRBC: 0 % (ref 0.0–0.2)

## 2019-04-29 LAB — APTT: aPTT: 45 seconds — ABNORMAL HIGH (ref 24–36)

## 2019-04-29 LAB — COMPREHENSIVE METABOLIC PANEL
ALT: 7 U/L (ref 0–44)
AST: 17 U/L (ref 15–41)
Albumin: 3.1 g/dL — ABNORMAL LOW (ref 3.5–5.0)
Alkaline Phosphatase: 63 U/L (ref 38–126)
Anion gap: 23 — ABNORMAL HIGH (ref 5–15)
BUN: 49 mg/dL — ABNORMAL HIGH (ref 8–23)
CO2: 22 mmol/L (ref 22–32)
Calcium: 8.4 mg/dL — ABNORMAL LOW (ref 8.9–10.3)
Chloride: 93 mmol/L — ABNORMAL LOW (ref 98–111)
Creatinine, Ser: 9.57 mg/dL — ABNORMAL HIGH (ref 0.61–1.24)
GFR calc Af Amer: 6 mL/min — ABNORMAL LOW (ref >60)
GFR calc non Af Amer: 5 mL/min — ABNORMAL LOW (ref >60)
Glucose, Bld: 96 mg/dL (ref 70–99)
Potassium: 4.4 mmol/L (ref 3.5–5.1)
Sodium: 138 mmol/L (ref 135–145)
Total Bilirubin: 1.3 mg/dL — ABNORMAL HIGH (ref 0.3–1.2)
Total Protein: 6.7 g/dL (ref 6.5–8.1)

## 2019-04-29 LAB — PROTIME-INR
INR: 1.2 (ref 0.8–1.2)
Prothrombin Time: 15.1 seconds (ref 11.4–15.2)

## 2019-04-29 SURGERY — AMPUTATION, FOOT, TRANSMETATARSAL
Anesthesia: Monitor Anesthesia Care | Site: Foot | Laterality: Right

## 2019-04-29 MED ORDER — OXYCODONE HCL 5 MG/5ML PO SOLN: 5.0000 mg | Freq: Once | ORAL | Status: AC | PRN

## 2019-04-29 MED ORDER — MIDAZOLAM HCL 5 MG/5ML IJ SOLN
INTRAMUSCULAR | Status: DC | PRN
  Administered 2019-04-29: 2 mg via INTRAVENOUS

## 2019-04-29 MED ORDER — CHLORHEXIDINE GLUCONATE 4 % EX LIQD: 60.0000 mL | Freq: Once | CUTANEOUS | Status: DC

## 2019-04-29 MED ORDER — CARVEDILOL 3.125 MG PO TABS
3.1250 mg | ORAL_TABLET | Freq: Once | ORAL | Status: AC
  Administered 2019-04-29: 08:00:00 3.125 mg via ORAL

## 2019-04-29 MED ORDER — CHLORHEXIDINE GLUCONATE CLOTH 2 % EX PADS
6.0000 | MEDICATED_PAD | Freq: Every day | CUTANEOUS | Status: DC
  Administered 2019-04-30 – 2019-05-06 (×5): 6 via TOPICAL

## 2019-04-29 MED ORDER — ALUM & MAG HYDROXIDE-SIMETH 200-200-20 MG/5ML PO SUSP: 15.0000 mL | ORAL | Status: DC | PRN

## 2019-04-29 MED ORDER — PROPOFOL 500 MG/50ML IV EMUL
INTRAVENOUS | Status: DC | PRN
  Administered 2019-04-29: 75 ug/kg/min via INTRAVENOUS

## 2019-04-29 MED ORDER — SODIUM CHLORIDE 0.9 % IV SOLN: INTRAVENOUS | Status: DC | PRN

## 2019-04-29 MED ORDER — PROPOFOL 10 MG/ML IV BOLUS
INTRAVENOUS | Status: AC
  Filled 2019-04-29: qty 20

## 2019-04-29 MED ORDER — CARVEDILOL 3.125 MG PO TABS
ORAL_TABLET | ORAL | Status: AC
  Filled 2019-04-29: qty 1

## 2019-04-29 MED ORDER — LIDOCAINE HCL (PF) 1 % IJ SOLN
INTRAMUSCULAR | Status: DC | PRN
  Administered 2019-04-29: 30 mL

## 2019-04-29 MED ORDER — SODIUM CHLORIDE 0.9 % IV SOLN: INTRAVENOUS | Status: DC

## 2019-04-29 MED ORDER — POTASSIUM CHLORIDE CRYS ER 20 MEQ PO TBCR: 20.0000 meq | EXTENDED_RELEASE_TABLET | Freq: Every day | ORAL | Status: DC | PRN

## 2019-04-29 MED ORDER — LABETALOL HCL 5 MG/ML IV SOLN
10.0000 mg | INTRAVENOUS | Status: DC | PRN
  Filled 2019-04-29: qty 4

## 2019-04-29 MED ORDER — ACETAMINOPHEN 325 MG PO TABS
325.0000 mg | ORAL_TABLET | ORAL | Status: DC | PRN
  Administered 2019-05-07 – 2019-05-11 (×3): 650 mg via ORAL
  Filled 2019-04-29 (×3): qty 2

## 2019-04-29 MED ORDER — PANTOPRAZOLE SODIUM 40 MG PO TBEC
40.0000 mg | DELAYED_RELEASE_TABLET | Freq: Every day | ORAL | Status: DC
  Administered 2019-04-29 – 2019-05-18 (×20): 40 mg via ORAL
  Filled 2019-04-29 (×21): qty 1

## 2019-04-29 MED ORDER — FENTANYL CITRATE (PF) 250 MCG/5ML IJ SOLN
INTRAMUSCULAR | Status: AC
  Filled 2019-04-29: qty 5

## 2019-04-29 MED ORDER — ATORVASTATIN CALCIUM 80 MG PO TABS
80.0000 mg | ORAL_TABLET | Freq: Every day | ORAL | Status: DC
  Administered 2019-04-29 – 2019-05-17 (×16): 80 mg via ORAL
  Filled 2019-04-29 (×19): qty 1

## 2019-04-29 MED ORDER — METOPROLOL TARTRATE 5 MG/5ML IV SOLN: 2.0000 mg | INTRAVENOUS | Status: DC | PRN

## 2019-04-29 MED ORDER — CARVEDILOL 3.125 MG PO TABS
3.1250 mg | ORAL_TABLET | Freq: Two times a day (BID) | ORAL | Status: DC
  Administered 2019-04-29 – 2019-05-18 (×33): 3.125 mg via ORAL
  Filled 2019-04-29 (×36): qty 1

## 2019-04-29 MED ORDER — SODIUM CHLORIDE 0.9 % IV SOLN: 250.0000 mL | INTRAVENOUS | Status: DC | PRN

## 2019-04-29 MED ORDER — ONDANSETRON HCL 4 MG/2ML IJ SOLN
INTRAMUSCULAR | Status: DC | PRN
  Administered 2019-04-29: 4 mg via INTRAVENOUS

## 2019-04-29 MED ORDER — HYDROMORPHONE HCL 1 MG/ML IJ SOLN
0.5000 mg | INTRAMUSCULAR | Status: DC | PRN
  Administered 2019-04-30 – 2019-05-06 (×4): 1 mg via INTRAVENOUS
  Filled 2019-04-29 (×6): qty 1

## 2019-04-29 MED ORDER — ROPIVACAINE HCL 7.5 MG/ML IJ SOLN
INTRAMUSCULAR | Status: DC | PRN
  Administered 2019-04-29: 20 mL via PERINEURAL

## 2019-04-29 MED ORDER — METHOCARBAMOL 500 MG PO TABS
ORAL_TABLET | ORAL | Status: AC
  Administered 2019-04-29: 500 mg via ORAL
  Filled 2019-04-29: qty 1

## 2019-04-29 MED ORDER — AMIODARONE HCL 200 MG PO TABS
200.0000 mg | ORAL_TABLET | Freq: Every day | ORAL | Status: DC
  Administered 2019-04-29 – 2019-05-18 (×20): 200 mg via ORAL
  Filled 2019-04-29 (×21): qty 1

## 2019-04-29 MED ORDER — METHOCARBAMOL 500 MG PO TABS
500.0000 mg | ORAL_TABLET | Freq: Two times a day (BID) | ORAL | Status: DC | PRN
  Administered 2019-05-03 – 2019-05-07 (×4): 500 mg via ORAL
  Filled 2019-04-29 (×6): qty 1

## 2019-04-29 MED ORDER — LORAZEPAM 1 MG PO TABS
1.0000 mg | ORAL_TABLET | Freq: Two times a day (BID) | ORAL | Status: DC | PRN
  Administered 2019-05-07: 1 mg via ORAL
  Filled 2019-04-29: qty 1

## 2019-04-29 MED ORDER — DEXAMETHASONE SODIUM PHOSPHATE 10 MG/ML IJ SOLN
INTRAMUSCULAR | Status: AC
  Filled 2019-04-29: qty 1

## 2019-04-29 MED ORDER — LIDOCAINE HCL (PF) 1 % IJ SOLN
INTRAMUSCULAR | Status: AC
  Filled 2019-04-29: qty 30

## 2019-04-29 MED ORDER — MAGNESIUM SULFATE 2 GM/50ML IV SOLN: 2.0000 g | Freq: Every day | INTRAVENOUS | Status: DC | PRN

## 2019-04-29 MED ORDER — HYDRALAZINE HCL 20 MG/ML IJ SOLN: 5.0000 mg | INTRAMUSCULAR | Status: DC | PRN

## 2019-04-29 MED ORDER — ACETAMINOPHEN 325 MG RE SUPP: 325.0000 mg | RECTAL | Status: DC | PRN

## 2019-04-29 MED ORDER — MIDAZOLAM HCL 2 MG/2ML IJ SOLN
INTRAMUSCULAR | Status: AC
  Filled 2019-04-29: qty 2

## 2019-04-29 MED ORDER — ONDANSETRON HCL 4 MG/2ML IJ SOLN
INTRAMUSCULAR | Status: AC
  Filled 2019-04-29: qty 2

## 2019-04-29 MED ORDER — HEPARIN SODIUM (PORCINE) 5000 UNIT/ML IJ SOLN
5000.0000 [IU] | Freq: Three times a day (TID) | INTRAMUSCULAR | Status: DC
  Administered 2019-04-30 – 2019-05-18 (×52): 5000 [IU] via SUBCUTANEOUS
  Filled 2019-04-29 (×52): qty 1

## 2019-04-29 MED ORDER — LIDOCAINE 2% (20 MG/ML) 5 ML SYRINGE
INTRAMUSCULAR | Status: AC
  Filled 2019-04-29: qty 5

## 2019-04-29 MED ORDER — GUAIFENESIN-DM 100-10 MG/5ML PO SYRP: 15.0000 mL | ORAL_SOLUTION | ORAL | Status: DC | PRN

## 2019-04-29 MED ORDER — CEFAZOLIN SODIUM-DEXTROSE 2-4 GM/100ML-% IV SOLN
2.0000 g | INTRAVENOUS | Status: AC
  Administered 2019-04-29: 2 g via INTRAVENOUS
  Filled 2019-04-29: qty 100

## 2019-04-29 MED ORDER — OXYCODONE HCL 5 MG PO TABS
ORAL_TABLET | ORAL | Status: AC
  Filled 2019-04-29: qty 2

## 2019-04-29 MED ORDER — LIDOCAINE 2% (20 MG/ML) 5 ML SYRINGE
INTRAMUSCULAR | Status: DC | PRN
  Administered 2019-04-29: 40 mg via INTRAVENOUS

## 2019-04-29 MED ORDER — CLOPIDOGREL BISULFATE 75 MG PO TABS
75.0000 mg | ORAL_TABLET | Freq: Every day | ORAL | Status: DC
  Administered 2019-04-29 – 2019-05-17 (×16): 75 mg via ORAL
  Filled 2019-04-29 (×19): qty 1

## 2019-04-29 MED ORDER — OXYCODONE HCL 5 MG PO TABS
ORAL_TABLET | ORAL | Status: AC
  Administered 2019-04-29: 5 mg via ORAL
  Filled 2019-04-29: qty 1

## 2019-04-29 MED ORDER — VITAMIN B-12 1000 MCG PO TABS
1000.0000 ug | ORAL_TABLET | Freq: Every evening | ORAL | Status: DC
  Administered 2019-04-29 – 2019-05-17 (×16): 1000 ug via ORAL
  Filled 2019-04-29 (×19): qty 1

## 2019-04-29 MED ORDER — MIDODRINE HCL 5 MG PO TABS
10.0000 mg | ORAL_TABLET | ORAL | Status: DC
  Administered 2019-05-04 – 2019-05-11 (×3): 10 mg via ORAL
  Filled 2019-04-29 (×6): qty 2

## 2019-04-29 MED ORDER — PROPOFOL 10 MG/ML IV BOLUS
INTRAVENOUS | Status: DC | PRN
  Administered 2019-04-29 (×2): 10 mg via INTRAVENOUS

## 2019-04-29 MED ORDER — ONDANSETRON HCL 4 MG/2ML IJ SOLN: 4.0000 mg | Freq: Once | INTRAMUSCULAR | Status: DC | PRN

## 2019-04-29 MED ORDER — BISACODYL 5 MG PO TBEC
5.0000 mg | DELAYED_RELEASE_TABLET | Freq: Every day | ORAL | Status: DC | PRN
  Administered 2019-05-09: 5 mg via ORAL
  Filled 2019-04-29: qty 1

## 2019-04-29 MED ORDER — SODIUM CHLORIDE 0.9% FLUSH: 3.0000 mL | INTRAVENOUS | Status: DC | PRN

## 2019-04-29 MED ORDER — PHENOL 1.4 % MT LIQD: 1.0000 | OROMUCOSAL | Status: DC | PRN

## 2019-04-29 MED ORDER — SUCROFERRIC OXYHYDROXIDE 500 MG PO CHEW
1250.0000 mg | CHEWABLE_TABLET | Freq: Three times a day (TID) | ORAL | Status: DC
  Administered 2019-04-30 – 2019-05-11 (×6): 1250 mg via ORAL
  Filled 2019-04-29 (×40): qty 2.5

## 2019-04-29 MED ORDER — RENA-VITE PO TABS
1.0000 | ORAL_TABLET | Freq: Every day | ORAL | Status: DC
  Administered 2019-04-29 – 2019-05-17 (×19): 1 via ORAL
  Filled 2019-04-29 (×19): qty 1

## 2019-04-29 MED ORDER — ONDANSETRON HCL 4 MG/2ML IJ SOLN
4.0000 mg | Freq: Four times a day (QID) | INTRAMUSCULAR | Status: DC | PRN
  Administered 2019-04-29 – 2019-05-17 (×12): 4 mg via INTRAVENOUS
  Filled 2019-04-29 (×12): qty 2

## 2019-04-29 MED ORDER — SODIUM CHLORIDE 0.9% FLUSH
3.0000 mL | Freq: Two times a day (BID) | INTRAVENOUS | Status: DC
  Administered 2019-04-29 – 2019-05-18 (×33): 3 mL via INTRAVENOUS

## 2019-04-29 MED ORDER — DOCUSATE SODIUM 100 MG PO CAPS
100.0000 mg | ORAL_CAPSULE | Freq: Every day | ORAL | Status: DC
  Administered 2019-04-30 – 2019-05-17 (×11): 100 mg via ORAL
  Filled 2019-04-29 (×14): qty 1

## 2019-04-29 MED ORDER — MIDODRINE HCL 5 MG PO TABS
ORAL_TABLET | ORAL | Status: AC
  Filled 2019-04-29: qty 5

## 2019-04-29 MED ORDER — SENNOSIDES-DOCUSATE SODIUM 8.6-50 MG PO TABS: 1.0000 | ORAL_TABLET | Freq: Every evening | ORAL | Status: DC | PRN

## 2019-04-29 MED ORDER — CEFAZOLIN SODIUM-DEXTROSE 2-4 GM/100ML-% IV SOLN: 2.0000 g | Freq: Three times a day (TID) | INTRAVENOUS | Status: DC

## 2019-04-29 MED ORDER — TEMAZEPAM 15 MG PO CAPS
15.0000 mg | ORAL_CAPSULE | Freq: Every evening | ORAL | Status: DC | PRN
  Administered 2019-05-15 – 2019-05-17 (×2): 15 mg via ORAL
  Filled 2019-04-29 (×2): qty 1

## 2019-04-29 MED ORDER — FENTANYL CITRATE (PF) 100 MCG/2ML IJ SOLN: 25.0000 ug | INTRAMUSCULAR | Status: DC | PRN

## 2019-04-29 MED ORDER — OXYCODONE HCL 5 MG PO TABS: 5.0000 mg | ORAL_TABLET | Freq: Once | ORAL | Status: AC | PRN

## 2019-04-29 MED ORDER — 0.9 % SODIUM CHLORIDE (POUR BTL) OPTIME
TOPICAL | Status: DC | PRN
  Administered 2019-04-29: 1000 mL

## 2019-04-29 MED ORDER — OXYCODONE HCL 5 MG PO TABS
5.0000 mg | ORAL_TABLET | ORAL | Status: DC | PRN
  Administered 2019-04-29 – 2019-04-30 (×3): 10 mg via ORAL
  Administered 2019-04-30: 5 mg via ORAL
  Administered 2019-04-30 – 2019-05-01 (×4): 10 mg via ORAL
  Administered 2019-05-03 (×2): 5 mg via ORAL
  Administered 2019-05-05: 10 mg via ORAL
  Administered 2019-05-05 – 2019-05-06 (×3): 5 mg via ORAL
  Administered 2019-05-07 – 2019-05-08 (×4): 10 mg via ORAL
  Administered 2019-05-08: 5 mg via ORAL
  Administered 2019-05-08 – 2019-05-10 (×3): 10 mg via ORAL
  Administered 2019-05-11 – 2019-05-16 (×8): 5 mg via ORAL
  Filled 2019-04-29 (×3): qty 1
  Filled 2019-04-29: qty 2
  Filled 2019-04-29 (×2): qty 1
  Filled 2019-04-29 (×2): qty 2
  Filled 2019-04-29: qty 1
  Filled 2019-04-29 (×2): qty 2
  Filled 2019-04-29: qty 1
  Filled 2019-04-29 (×3): qty 2
  Filled 2019-04-29: qty 1
  Filled 2019-04-29 (×3): qty 2
  Filled 2019-04-29: qty 1
  Filled 2019-04-29: qty 2
  Filled 2019-04-29: qty 1
  Filled 2019-04-29: qty 2
  Filled 2019-04-29 (×5): qty 1
  Filled 2019-04-29 (×3): qty 2

## 2019-04-29 SURGICAL SUPPLY — 36 items
BLADE AVERAGE 25MMX9MM (BLADE) ×1
BLADE AVERAGE 25X9 (BLADE) ×2 IMPLANT
BLADE SAW SGTL 81X20 HD (BLADE) IMPLANT
BNDG CONFORM 3 STRL LF (GAUZE/BANDAGES/DRESSINGS) IMPLANT
BNDG ELASTIC 4X5.8 VLCR STR LF (GAUZE/BANDAGES/DRESSINGS) ×3 IMPLANT
BNDG GAUZE ELAST 4 BULKY (GAUZE/BANDAGES/DRESSINGS) ×3 IMPLANT
CANISTER SUCT 3000ML PPV (MISCELLANEOUS) ×3 IMPLANT
COVER SURGICAL LIGHT HANDLE (MISCELLANEOUS) ×3 IMPLANT
COVER WAND RF STERILE (DRAPES) ×3 IMPLANT
DRAPE EXTREMITY T 121X128X90 (DISPOSABLE) ×2 IMPLANT
DRAPE HALF SHEET 40X57 (DRAPES) ×1 IMPLANT
DRSG ADAPTIC 3X8 NADH LF (GAUZE/BANDAGES/DRESSINGS) ×2 IMPLANT
ELECT REM PT RETURN 9FT ADLT (ELECTROSURGICAL) ×3
ELECTRODE REM PT RTRN 9FT ADLT (ELECTROSURGICAL) ×1 IMPLANT
GAUZE SPONGE 4X4 12PLY STRL (GAUZE/BANDAGES/DRESSINGS) ×3 IMPLANT
GLOVE BIO SURGEON STRL SZ7.5 (GLOVE) ×3 IMPLANT
GLOVE BIOGEL PI IND STRL 7.0 (GLOVE) IMPLANT
GLOVE BIOGEL PI INDICATOR 7.0 (GLOVE) ×6
GOWN STRL REUS W/ TWL LRG LVL3 (GOWN DISPOSABLE) ×2 IMPLANT
GOWN STRL REUS W/ TWL XL LVL3 (GOWN DISPOSABLE) ×1 IMPLANT
GOWN STRL REUS W/TWL LRG LVL3 (GOWN DISPOSABLE) ×6
GOWN STRL REUS W/TWL XL LVL3 (GOWN DISPOSABLE) ×3
KIT BASIN OR (CUSTOM PROCEDURE TRAY) ×3 IMPLANT
KIT TURNOVER KIT B (KITS) ×3 IMPLANT
NDL HYPO 25GX1X1/2 BEV (NEEDLE) IMPLANT
NEEDLE HYPO 25GX1X1/2 BEV (NEEDLE) IMPLANT
NS IRRIG 1000ML POUR BTL (IV SOLUTION) ×3 IMPLANT
PACK GENERAL/GYN (CUSTOM PROCEDURE TRAY) ×3 IMPLANT
PAD ARMBOARD 7.5X6 YLW CONV (MISCELLANEOUS) ×6 IMPLANT
SPECIMEN JAR SMALL (MISCELLANEOUS) ×3 IMPLANT
SUT ETHILON 3 0 PS 1 (SUTURE) ×5 IMPLANT
SYR CONTROL 10ML LL (SYRINGE) IMPLANT
TOWEL GREEN STERILE (TOWEL DISPOSABLE) ×6 IMPLANT
TOWEL GREEN STERILE FF (TOWEL DISPOSABLE) ×3 IMPLANT
UNDERPAD 30X30 (UNDERPADS AND DIAPERS) ×3 IMPLANT
WATER STERILE IRR 1000ML POUR (IV SOLUTION) ×3 IMPLANT

## 2019-04-29 NOTE — Op Note (Signed)
    Patient name: Jose Garrett MRN: [id number] DOB: [birth year] Sex: male  04/29/2019 Pre-operative Diagnosis: right foot gangrenous changes Post-operative diagnosis:  Same Surgeon:  Erlene Quan C. Donzetta Matters, MD Procedure Performed:   Right transmetatarsal amputation  Indications: 69 year old male with recent history of right lower extremity bypass.  He has progressive gangrenous changes of most of his toes and the dorsum of his foot.  He is indicated for transmetatarsal amputation.  Findings: All tissue appeared healthy in the amputation bed.   Procedure:  The patient was identified in the holding area and taken to the operating room where is placed supine operative able.  A preoperative popliteal block had been placed.  Antibiotics were minister and timeout was called.  We tested the block which was noted to be suitable.  We made a fishmouth type incision at the distal foot across the metatarsal bones.  We dissected down to the metatarsals lifted the periosteum.  The metatarsal bones were transected with oscillating saw.  We removed all 5 toes as one specimen.  We obtain hemostasis in the wound.  We smoothed the bones with rasp.  We thoroughly irrigated the wound.  We then reapproximated our flaps at the skin level with 3-0 nylon suture.  He was then awake from anesthesia having tolerated procedure without immediate complication.  All counts were correct at completion.  EBL: 25 cc    Makylie Rivere C. Donzetta Matters, MD [facility] Office: [phone] Pager: [phone]

## 2019-04-29 NOTE — Anesthesia Procedure Notes (Signed)
Procedure Name: MAC Performed by: Amara Manalang R, CRNA Pre-anesthesia Checklist: Patient identified, Emergency Drugs available, Suction available, Patient being monitored and Timeout performed Patient Re-evaluated:Patient Re-evaluated prior to induction Oxygen Delivery Method: Nasal cannula Placement Confirmation: positive ETCO2 Dental Injury: Teeth and Oropharynx as per pre-operative assessment        

## 2019-04-29 NOTE — Transfer of Care (Signed)
Immediate Anesthesia Transfer of Care Note  Patient: TRAE BOVENZI  Procedure(s) Performed: RIGHT TRANSMETATARSAL AMPUTATION (Right Foot)  Patient Location: PACU  Anesthesia Type:MAC combined with regional for post-op pain  Level of Consciousness: drowsy and patient cooperative  Airway & Oxygen Therapy: Patient Spontanous Breathing and Patient connected to nasal cannula oxygen  Post-op Assessment: Report given to RN, Post -op Vital signs reviewed and stable and Patient moving all extremities  Post vital signs: Reviewed and stable  Last Vitals:  Vitals Value Taken Time  BP    Temp    Pulse 63 04/29/19 0926  Resp 14 04/29/19 0926  SpO2 99 % 04/29/19 0926  Vitals shown include unvalidated device data.  Last Pain:  Vitals:   04/29/19 0726  TempSrc:   PainSc: 6          Complications: No apparent anesthesia complications

## 2019-04-29 NOTE — Procedures (Signed)
Patient seen and examined on Hemodialysis. BP (!) 154/73   Pulse 63   Temp 97.7 F (36.5 C) (Oral)   Resp 18   Ht 5\' 10"  (1.778 m)   Wt 79.2 kg   SpO2 100%   BMI 25.05 kg/m   QB 400 mL/ min via AVF UF goal 2L  Tolerating treatment without complaints at this time.  Madelon Lips MD [facility] pgr [phone] 4:43 PM

## 2019-04-29 NOTE — Progress Notes (Signed)
Patient arrived to 4E room 02 at this time. Telemetry applied and CCMD notified. V/a and assessment complete. CHG bath done. Patient oriented to room and how to call nurse with any needs. Will continue to monitor.   Emelda Fear, RN

## 2019-04-29 NOTE — Consult Note (Signed)
[facility] Renal Consultation Note    Indication for Consultation:  Management of ESRD/hemodialysis, anemia, hypertension/volume, and secondary hyperparathyroidism. PCP:  HPI: Jose Garrett is a 69 y.o. male with ESRD, CAD, Hx TAVR, PVD, and GERD who was admitted for planned right transmetatarsal amputation d/t progressive ischemic/gangrenous changes.  S/p surgery this morning - awake/alert. No CP, dyspnea, fever, chills, N/V/D. No foot pain at this time.  Dialyzes on MWF schedule at [facility] via R AVF - no recent issues although tells me he thinks EDW should be raised slightly.  Past Medical History:  Diagnosis Date  . Amyloidosis ([place])   . Anemia of chronic disease   . Anxiety   . CAD (coronary artery disease)    a. cardiac cath on 11/25/17 with occlusion of the RCA which could have been his event but the RCA could not be engaged. The RCA filled distally from left to right collaterals. There were no severe stenoses in the left coronary system. Medical therapy recommended, started on plavix.  . Chronic combined systolic and diastolic CHF (congestive heart failure) ([place])    a. EF dropped to 35-40% with grade 2 DD by echo 11/2017.  [name] DJD (degenerative joint disease), lumbar   . ESRD (end stage renal disease) ([place])    ESRD- HEMO MWF- [facility]  . GERD (gastroesophageal reflux disease)   . Hyperlipidemia   . Hypertension   . Myocardial infarction ([name])   . Neuropathy   . Peripheral vascular disease ([place])   . Pneumonia   . Restless legs   . S/P TAVR (transcatheter aortic valve replacement) 10/21/2018   26 mm Edwards Sapien 3 Ultra transcatheter heart valve placed via percutaneous right transfemoral approach   . Severe aortic stenosis    severe   Past Surgical History:  Procedure Laterality Date  . ABDOMINAL AORTOGRAM W/LOWER EXTREMITY Bilateral 07/16/2018   Procedure: ABDOMINAL AORTOGRAM W/LOWER EXTREMITY;  Surgeon: Wellington Hampshire, MD;  Location: [place] [facility];   Service: Cardiovascular;  Laterality: Bilateral;  . ABDOMINAL AORTOGRAM W/LOWER EXTREMITY N/A 04/09/2019   Procedure: ABDOMINAL AORTOGRAM W/LOWER EXTREMITY - Right Lower;  Surgeon: Waynetta Sandy, MD;  Location: [place] CV LAB;  Service: Cardiovascular;  Laterality: N/A;  . AMPUTATION Left 08/19/2018   Procedure: AMPUTATION LEFT TOES SECOND AND THIRD;  Surgeon: Waynetta Sandy, MD;  Location: [place];  Service: Vascular;  Laterality: Left;  . AV FISTULA PLACEMENT Right 12/27/2015   Procedure: Right Arm ARTERIOVENOUS (AV) FISTULA CREATION;  Surgeon: Angelia Mould, MD;  Location: [place];  Service: Vascular;  Laterality: Right;  . CATARACT EXTRACTION W/ INTRAOCULAR LENS  IMPLANT, BILATERAL    . COLONOSCOPY    . ERCP W/ SPHICTEROTOMY    . FEMORAL-POPLITEAL BYPASS GRAFT Left 07/22/2018   Procedure: left FEMORAL TO BELOW KNEE POPLITEAL ARTERY BYPASS GREAFT using 17mm removable ring propaten gore graft;  Surgeon: Waynetta Sandy, MD;  Location: [place];  Service: Vascular;  Laterality: Left;  . FEMORAL-POPLITEAL BYPASS GRAFT Right 04/10/2019   Procedure: BYPASS GRAFT RIGHT FEMORAL-POPLITEAL ARTERY;  Surgeon: Rosetta Posner, MD;  Location: [place];  Service: Vascular;  Laterality: Right;  . INTRAOPERATIVE TRANSTHORACIC ECHOCARDIOGRAM N/A 10/21/2018   Procedure: Intraoperative Transthoracic Echocardiogram;  Surgeon: Sherren Mocha, MD;  Location: [place];  Service: Open Heart Surgery;  Laterality: N/A;  . IR GENERIC HISTORICAL  01/27/2016   IR FLUORO GUIDE CV LINE RIGHT 01/27/2016 Arne Cleveland, MD [facility]INTER[facility]  . IR GENERIC HISTORICAL  01/27/2016  IR US GUIDE VASC ACCESS RIGHT 01/27/2016 Arne Cleveland, MD [facility]  . LAPAROSCOPIC CHOLECYSTECTOMY  03/2011  . LEFT HEART CATH AND CORONARY ANGIOGRAPHY N/A 11/25/2017   Procedure: LEFT HEART CATH AND CORONARY ANGIOGRAPHY;  Surgeon: Martinique, Peter M, MD;  Location: [place] CV LAB;  Service: Cardiovascular;  Laterality:  N/A;  . LEFT HEART CATH AND CORONARY ANGIOGRAPHY N/A 07/24/2018   Procedure: LEFT HEART CATH AND CORONARY ANGIOGRAPHY;  Surgeon: Sherren Mocha, MD;  Location: [place] CV LAB;  Service: Cardiovascular;  Laterality: N/A;  . RENAL BIOPSY, PERCUTANEOUS Right 09/08/2014  . RIGHT/LEFT HEART CATH AND CORONARY ANGIOGRAPHY N/A 03/25/2018   Procedure: RIGHT/LEFT HEART CATH AND CORONARY ANGIOGRAPHY;  Surgeon: Martinique, Peter M, MD;  Location: [place] CV LAB;  Service: Cardiovascular;  Laterality: N/A;  . TOE AMPUTATION Left 08/19/2018   2nd & 3rd toes  . TONSILLECTOMY  ~ 1960  . TRANSCATHETER AORTIC VALVE REPLACEMENT, TRANSFEMORAL N/A 10/21/2018   Procedure: TRANSCATHETER AORTIC VALVE REPLACEMENT, TRANSFEMORAL;  Surgeon: Sherren Mocha, MD;  Location: [place];  Service: Open Heart Surgery;  Laterality: N/A;  . ULTRASOUND GUIDANCE FOR VASCULAR ACCESS  03/25/2018   Procedure: Ultrasound Guidance For Vascular Access;  Surgeon: Martinique, Peter M, MD;  Location: [place] [facility] LAB;  Service: Cardiovascular;;   Family History  Problem Relation Age of Onset  . Hypertension Mother   . Diabetes Mother   . Heart disease Mother   . Skin cancer Mother   . Hypertension Father   . Diabetes Father   . Diabetes Sister    Social History:  reports that he quit smoking about 8 years ago. His smoking use included cigarettes. He has a 80.00 pack-year smoking history. He has never used smokeless tobacco. He reports that he does not drink alcohol or use drugs.  ROS: As per HPI otherwise negative.  Physical Exam: Vitals:   04/29/19 0945 04/29/19 1000 04/29/19 1015 04/29/19 1103  BP: 133/61 137/66 (!) 150/63 (!) 160/66  Pulse: (!) 109 60 (!) 58 64  Resp: 18 11 17    Temp:  98.3 F (36.8 C)  97.6 F (36.4 C)  TempSrc:    Oral  SpO2: 97% 97% 97% 99%  Weight:      Height:         General: Well developed, well nourished, in no acute distress. Head: Normocephalic, atraumatic, sclera non-icteric, mucus membranes  are moist. Neck: Supple without lymphadenopathy/masses. JVD not elevated. Lungs: Clear bilaterally to auscultation without wheezes, rales, or rhonchi. Breathing is unlabored. Heart: RRR with normal S1, S2. No murmurs, rubs, or gallops appreciated. Abdomen: Soft, non-tender, non-distended with normoactive bowel sounds. No rebound/guarding. No obvious abdominal masses. Musculoskeletal:  Strength and tone appear normal for age. Lower extremities: No LLE edema; R leg with 1+ edema - healing incisions on medial calf and thigh, foot bandaged s/p TMA surgery Neuro: Alert and oriented X 3. Moves all extremities spontaneously. Psych:  Responds to questions appropriately with a normal affect. Dialysis Access: R AVF + thrill  No Known Allergies Prior to Admission medications   Medication Sig Start Date End Date Taking? Authorizing Provider  Acetaminophen (TYLENOL DISSOLVE PACKS) 500 MG PACK Place 500-1,000 mg under the tongue every 6 (six) hours as needed (for pain).   Yes [provider]  amiodarone (PACERONE) 200 MG tablet Take 1 tablet (200 mg total) by mouth daily. 02/13/19  Yes Burnell Blanks, MD  atorvastatin (LIPITOR) 80 MG tablet Take 1 tablet (80 mg total) by mouth daily  with supper. 08/12/18  Yes Angiulli, Lavon Paganini, PA-C  carvedilol (COREG) 3.125 MG tablet Take 1 tablet (3.125 mg total) by mouth 2 (two) times daily. 11/12/18 11/07/19 Yes Eileen Stanford, PA-C  methocarbamol (ROBAXIN) 500 MG tablet Take 500 mg by mouth 2 (two) times daily as needed for mild pain. 02/20/19  Yes [provider]  midodrine (PROAMATINE) 10 MG tablet Take 1 tablet (10 mg total) by mouth every Monday, Wednesday, and Friday with hemodialysis. 08/13/18  Yes Angiulli, Lavon Paganini, PA-C  multivitamin (RENA-VIT) TABS tablet Take 1 tablet by mouth at bedtime. Patient taking differently: Take 1 tablet by mouth daily with breakfast.  08/12/18  Yes Angiulli, Lavon Paganini, PA-C  oxyCODONE (OXY IR/ROXICODONE) 5 MG  immediate release tablet Take 1 tablet (5 mg total) by mouth every 6 (six) hours as needed for moderate pain. 04/15/19  Yes Dagoberto Ligas, PA-C  sucroferric oxyhydroxide (VELPHORO) 500 MG chewable tablet Chew 2 tablets (1,000 mg total) by mouth 3 (three) times daily with meals. Patient taking differently: Chew 1,250 mg by mouth 3 (three) times daily with meals.  08/12/18  Yes Angiulli, Lavon Paganini, PA-C  temazepam (RESTORIL) 15 MG capsule Take 15 mg by mouth at bedtime as needed for sleep.   Yes [provider]  vitamin B-12 (CYANOCOBALAMIN) 1000 MCG tablet Take 1,000 mcg by mouth every evening.    Yes [provider]  cephALEXin (KEFLEX) 500 MG capsule Take 1 capsule (500 mg total) by mouth 2 (two) times daily. Patient not taking: Reported on 04/23/2019 04/22/19   [name], MD  clopidogrel (PLAVIX) 75 MG tablet Take 1 tablet (75 mg total) by mouth daily with supper. 02/13/19   Burnell Blanks, MD  LORazepam (ATIVAN) 1 MG tablet Take 1 mg by mouth 2 (two) times daily as needed for anxiety.    [provider]   Current Facility-Administered Medications  Medication Dose Route Frequency Provider Last Rate Last Admin  . 0.9 %  sodium chloride infusion  250 mL Intravenous PRN Laurence Slate M, PA-C      . acetaminophen (TYLENOL) tablet 325-650 mg  325-650 mg Oral Q4H PRN Laurence Slate M, PA-C       Or  . acetaminophen (TYLENOL) suppository 325-650 mg  325-650 mg Rectal Q4H PRN Ulyses Amor, PA-C      . alum & mag hydroxide-simeth (MAALOX/MYLANTA) 200-200-20 MG/5ML suspension 15-30 mL  15-30 mL Oral Q2H PRN Laurence Slate M, PA-C      . amiodarone (PACERONE) tablet 200 mg  200 mg Oral Daily Laurence Slate M, PA-C   200 mg at 04/29/19 1210  . atorvastatin (LIPITOR) tablet 80 mg  80 mg Oral Q supper Laurence Slate M, PA-C      . bisacodyl (DULCOLAX) EC tablet 5 mg  5 mg Oral Daily PRN Laurence Slate M, PA-C      . carvedilol (COREG) tablet 3.125 mg  3.125 mg Oral BID  Laurence Slate M, PA-C      . [START ON 04/30/2019] Chlorhexidine Gluconate Cloth 2 % PADS 6 each  6 each Topical Q0600 Loren Racer, PA-C      . clopidogrel (PLAVIX) tablet 75 mg  75 mg Oral Q supper Laurence Slate M, PA-C      . [START ON 04/30/2019] docusate sodium (COLACE) capsule 100 mg  100 mg Oral Daily Collins, Emma M, PA-C      . guaiFENesin-dextromethorphan (ROBITUSSIN DM) 100-10 MG/5ML syrup 15 mL  15 mL Oral Q4H PRN  Ulyses Amor, PA-C      . [START ON 04/30/2019] heparin injection 5,000 Units  5,000 Units Subcutaneous Q8H Collins, Emma M, PA-C      . hydrALAZINE (APRESOLINE) injection 5 mg  5 mg Intravenous Q20 Min PRN Laurence Slate M, [place]      . HYDROmorphone (DILAUDID) injection 0.5-1 mg  0.5-1 mg Intravenous Q2H PRN Laurence Slate M, PA-C      . labetalol (NORMODYNE) injection 10 mg  10 mg Intravenous Q10 min PRN Ulyses Amor, PA-C      . LORazepam (ATIVAN) tablet 1 mg  1 mg Oral BID PRN Laurence Slate M, PA-C      . magnesium sulfate IVPB 2 g 50 mL  2 g Intravenous Daily PRN Laurence Slate M, PA-C      . methocarbamol (ROBAXIN) tablet 500 mg  500 mg Oral BID PRN Laurence Slate M, PA-C   500 mg at 04/29/19 1053  . metoprolol tartrate (LOPRESSOR) injection 2-5 mg  2-5 mg Intravenous Q2H PRN Laurence Slate M, PA-C      . midodrine (PROAMATINE) tablet 10 mg  10 mg Oral Q M,W,F-HD Laurence Slate M, PA-C      . multivitamin (RENA-VIT) tablet 1 tablet  1 tablet Oral QHS Collins, Emma M, PA-C      . ondansetron [facility]) injection 4 mg  4 mg Intravenous Q6H PRN Laurence Slate M, PA-C      . oxyCODONE (Oxy IR/ROXICODONE) immediate release tablet 5-10 mg  5-10 mg Oral Q4H PRN Laurence Slate M, PA-C      . pantoprazole (PROTONIX) EC tablet 40 mg  40 mg Oral Daily Laurence Slate M, PA-C   40 mg at 04/29/19 1211  . phenol (CHLORASEPTIC) mouth spray 1 spray  1 spray Mouth/Throat PRN Laurence Slate M, PA-C      . potassium chloride SA (KLOR-CON) CR tablet 20-40 mEq  20-40 mEq Oral Daily PRN Laurence Slate M, PA-C      . senna-docusate (Senokot-S) tablet 1 tablet  1 tablet Oral QHS PRN Laurence Slate M, PA-C      . sodium chloride flush (NS) 0.9 % injection 3 mL  3 mL Intravenous Q12H Collins, Emma M, PA-C      . sodium chloride flush (NS) 0.9 % injection 3 mL  3 mL Intravenous PRN Laurence Slate M, PA-C      . sucroferric oxyhydroxide [facility]) chewable tablet 1,250 mg  1,250 mg Oral TID WC Collins, Emma M, PA-C      . temazepam (RESTORIL) capsule 15 mg  15 mg Oral QHS PRN Laurence Slate M, PA-C      . vitamin B-12 (CYANOCOBALAMIN) tablet 1,000 mcg  1,000 mcg Oral QPM Ulyses Amor, PA-C       Labs: Basic Metabolic Panel: Recent Labs  Lab 04/29/19 0713  NA 138  K 4.4  CL 93*  CO2 22  GLUCOSE 96  BUN 49*  CREATININE 9.57*  CALCIUM 8.4*   Liver Function Tests: Recent Labs  Lab 04/29/19 0713  AST 17  ALT 7  ALKPHOS 63  BILITOT 1.3*  PROT 6.7  ALBUMIN 3.1*   CBC: Recent Labs  Lab 04/29/19 0713  WBC 10.0  HGB 10.3*  HCT 32.1*  MCV 105.6*  PLT 134*   Dialysis Orders:  MWF at [facility] 4hr, 400/800, EDW 76.5kg, 2K/2Ca, UFP #4, AVF, heparin 2400 - Parsabiv 5mg  IV q HD - Calcitriol 1.47mcg PO q HD - Mircera 13mcg IV q 2  weeks (last given 109mcg on 1/20, Hgb 10.3 on 2/10)  Assessment/Plan: 1.  PAD with gangrenous changes -> R TMA on 2/17. 2.  ESRD: Continue HD per usual MWF sched - HD today, no heparin. 3.  Hypertension/volume: BP up slightly - keep outpt EDW for now. 4.  Anemia: Hgb 10.3 - will give Mircera today. 5.  Metabolic bone disease: Ca ok, Phos pending. 6.  Hx TAVR 7.  CAD/HFrEF  Veneta Penton, PA-C 04/29/2019, 12:27 PM  [facility] Pager: [phone]

## 2019-04-29 NOTE — Progress Notes (Signed)
Inpatient Rehabilitation-Admissions Coordinator   CIR consult received. Noted pt is POD 0 from right transmetatarsal amputation. Will follow for therapy evaluations to see if pt has IP Rehab needs.   Raechel Ache, OTR/L  Rehab Admissions Coordinator  [phone] 04/29/2019 1:11 PM

## 2019-04-29 NOTE — Progress Notes (Signed)
PT Cancellation Note  Patient Details Name: Jose Garrett MRN: [id number] DOB: [birth year]   Cancelled Treatment:    Reason Eval/Treat Not Completed: Patient at procedure or test/unavailable patient not in room, assuming he is likely at HD scheduled this afternoon. Will follow acutely and return if time/schedule allow.    Windell Norfolk, DPT, PN1   Supplemental Physical Therapist [facility]    Pager [phone] Acute Rehab Office [phone]

## 2019-04-29 NOTE — Progress Notes (Signed)
PHARMACY NOTE:  ANTIMICROBIAL RENAL DOSAGE ADJUSTMENT  Current antimicrobial regimen includes a mismatch between antimicrobial dosage and estimated renal function.  As per policy approved by the Pharmacy & Therapeutics and Medical Executive Committees, the antimicrobial dosage will be adjusted accordingly.  Current antimicrobial dosage:  Ancef 2gm IV q8h  Indication: surgical prophylaxis  Renal Function:  Estimated Creatinine Clearance: 7.8 mL/min (A) (by C-G formula based on SCr of 9.57 mg/dL (H)).     Antimicrobial dosage has been changed to:  Ancef 2gm given ~8:30am. No further dosing needed as this dose will cover for 24 hours  Additional comments:   Thank you for allowing pharmacy to be a part of this patient's care.  Hildred Laser, PharmD Clinical Pharmacist **Pharmacist phone directory can now be found on [email or web address] ([email or web address]).  Listed under [place].

## 2019-04-29 NOTE — Anesthesia Postprocedure Evaluation (Signed)
Anesthesia Post Note  Patient: Jose Garrett  Procedure(s) Performed: RIGHT TRANSMETATARSAL AMPUTATION (Right Foot)     Patient location during evaluation: PACU Anesthesia Type: Regional Level of consciousness: awake and alert Pain management: pain level controlled Vital Signs Assessment: post-procedure vital signs reviewed and stable Respiratory status: spontaneous breathing, nonlabored ventilation and respiratory function stable Cardiovascular status: stable and blood pressure returned to baseline Anesthetic complications: no    Last Vitals:  Vitals:   04/29/19 1000 04/29/19 1015  BP: 137/66 (!) 150/63  Pulse: 60 (!) 58  Resp: 11 17  Temp: 36.8 C   SpO2: 97% 97%    Last Pain:  Vitals:   04/29/19 1030  TempSrc:   PainSc: [place]

## 2019-04-29 NOTE — H&P (Signed)
   History and Physical Update  The patient was interviewed and re-examined.  The patient's previous History and Physical has been reviewed and is unchanged from recent office visit. Plan for right tma today in OR.   Shawntrice Salle C. Donzetta Matters, MD [facility] Office: [phone] Pager: [phone]   04/29/2019, 8:22 AM

## 2019-04-29 NOTE — Anesthesia Procedure Notes (Signed)
Anesthesia Regional Block: Popliteal block   Pre-Anesthetic Checklist: ,, timeout performed, Correct Patient, Correct Site, Correct Laterality, Correct Procedure, Correct Position, site marked, Risks and benefits discussed,  Surgical consent,  Pre-op evaluation,  At surgeon's request and post-op pain management  Laterality: Right  Prep: chloraprep       Needles:  Injection technique: Single-shot  Needle Type: Echogenic Needle     Needle Length: 10cm  Needle Gauge: 21     Additional Needles:   Narrative:  Start time: 04/29/2019 8:04 AM End time: 04/29/2019 8:08 AM Injection made incrementally with aspirations every 5 mL.  Performed by: Personally  Anesthesiologist: Audry Pili, MD  Additional Notes: No pain on injection. No increased resistance to injection. Injection made in 5cc increments. Good needle visualization. Patient tolerated the procedure well.

## 2019-04-29 NOTE — Plan of Care (Signed)
Continue to monitor

## 2019-04-29 NOTE — Progress Notes (Addendum)
Patient back from dialysis. No complaints at this time. V/s and assessment complete. Patient resting in bed.

## 2019-04-30 LAB — CBC
HCT: 32.2 % — ABNORMAL LOW (ref 39.0–52.0)
Hemoglobin: 10.1 g/dL — ABNORMAL LOW (ref 13.0–17.0)
MCH: 33.1 pg (ref 26.0–34.0)
MCHC: 31.4 g/dL (ref 30.0–36.0)
MCV: 105.6 fL — ABNORMAL HIGH (ref 80.0–100.0)
Platelets: 134 10*3/uL — ABNORMAL LOW (ref 150–400)
RBC: 3.05 MIL/uL — ABNORMAL LOW (ref 4.22–5.81)
RDW: 14 % (ref 11.5–15.5)
WBC: 15.5 10*3/uL — ABNORMAL HIGH (ref 4.0–10.5)
nRBC: 0 % (ref 0.0–0.2)

## 2019-04-30 LAB — BASIC METABOLIC PANEL
Anion gap: 16 — ABNORMAL HIGH (ref 5–15)
BUN: 22 mg/dL (ref 8–23)
CO2: 26 mmol/L (ref 22–32)
Calcium: 8.3 mg/dL — ABNORMAL LOW (ref 8.9–10.3)
Chloride: 96 mmol/L — ABNORMAL LOW (ref 98–111)
Creatinine, Ser: 6.18 mg/dL — ABNORMAL HIGH (ref 0.61–1.24)
GFR calc Af Amer: 10 mL/min — ABNORMAL LOW (ref >60)
GFR calc non Af Amer: 9 mL/min — ABNORMAL LOW (ref >60)
Glucose, Bld: 123 mg/dL — ABNORMAL HIGH (ref 70–99)
Potassium: 5.3 mmol/L — ABNORMAL HIGH (ref 3.5–5.1)
Sodium: 138 mmol/L (ref 135–145)

## 2019-04-30 MED ORDER — SODIUM ZIRCONIUM CYCLOSILICATE 10 G PO PACK
10.0000 g | PACK | Freq: Once | ORAL | Status: AC
  Administered 2019-04-30: 10:00:00 10 g via ORAL
  Filled 2019-04-30: qty 1

## 2019-04-30 NOTE — Progress Notes (Addendum)
   [facility] Progress Note   Assessment/ Plan:   Dialysis Orders:  MWF at [facility] 4hr, 400/800, EDW 76.5kg, 2K/2Ca, UFP #4, AVF, heparin 2400 - Parsabiv 5mg  IV q HD - Calcitriol 1.74mcg PO q HD - Mircera 61mcg IV q 2 weeks (last given 38mcg on 1/20, Hgb 10.3 on 2/10)  Assessment/Plan: 1.  PAD with gangrenous changes -> R TMA on 2/17. 2.  ESRD: Continue HD per usual MWF sched - s/p HD 2/17, next planned 2/19.  K 5.3 this AM, once x lokelma 3.  Hypertension/volume: BP up slightly - keep outpt EDW for now. 4.  Anemia: Hgb 10.3 - s/p mircera 5.  Metabolic bone disease: Ca ok, Phos pending. 6.  Hx TAVR 7.  CAD/HFrEF  Subjective:    Had a rough night last night- R leg started hurting.  Low-grade fever as well.  Finally able to get some rest when I walk in.   Objective:   BP (!) 116/55 (BP Location: Left Arm)   Pulse 76   Temp 99.2 F (37.3 C) (Oral)   Resp 15   Ht 5\' 10"  (1.778 m)   Wt 77.6 kg   SpO2 97%   BMI 24.55 kg/m   Physical Exam: Gen: sleeping, easily arousable CVS: RRR Resp: clear anteriorly with some rhonchi in bases Abd: soft Ext: RLE in sterile dressing ACCESS: R RC AVF + T/B  Labs: BMET Recent Labs  Lab 04/29/19 0713 04/30/19 0314  NA 138 138  K 4.4 5.3*  CL 93* 96*  CO2 22 26  GLUCOSE 96 123*  BUN 49* 22  CREATININE 9.57* 6.18*  CALCIUM 8.4* 8.3*   CBC Recent Labs  Lab 04/29/19 0713 04/30/19 0314  WBC 10.0 15.5*  HGB 10.3* 10.1*  HCT 32.1* 32.2*  MCV 105.6* 105.6*  PLT 134* 134*      Medications:    . amiodarone  200 mg Oral Daily  . atorvastatin  80 mg Oral Q supper  . carvedilol  3.125 mg Oral BID  . Chlorhexidine Gluconate Cloth  6 each Topical Q0600  . clopidogrel  75 mg Oral Q supper  . docusate sodium  100 mg Oral Daily  . heparin  5,000 Units Subcutaneous Q8H  . midodrine  10 mg Oral Q M,W,F-HD  . multivitamin  1 tablet Oral QHS  . pantoprazole  40 mg Oral Daily  . sodium chloride flush  3 mL Intravenous  Q12H  . sucroferric oxyhydroxide  1,250 mg Oral TID WC  . vitamin B-12  1,000 mcg Oral QPM     Madelon Lips, MD 04/30/2019, 9:35 AM

## 2019-04-30 NOTE — Progress Notes (Signed)
Inpatient Rehabilitation-Admissions Coordinator   Noted PT and OT are recommending SNF. It is unlikely pt's insurance will approve CIR with both therapies recommending SNF for post acute follow up. Will not pursue CIR for this patient given current recommendations.   Raechel Ache, OTR/L  Rehab Admissions Coordinator  [phone] 04/30/2019 3:20 PM

## 2019-04-30 NOTE — Plan of Care (Signed)
Continue to monitor

## 2019-04-30 NOTE — Progress Notes (Addendum)
Progress Note    04/30/2019 7:36 AM 1 Day Post-Op  Subjective:  Having significant pain right leg, right foot s/p right transmetatarsal amputation. Says its a 8/10. Otherwise no other complaints. Has not ambulated at all yet was unavailable when PT came to work with him. States he has not eaten. Denies chest pain, shortness of breath, nausea, vomiting or abdominal pain. No pain LLE   Vitals:   04/30/19 0020 04/30/19 0432  BP:  (!) 124/59  Pulse: 92 86  Resp:  (!) 22  Temp: 99.8 F (37.7 C) 99.4 F (37.4 C)  SpO2: 96% 96%   Physical Exam: General: appears in discomfort but not in acute distress Lungs:  Non labored Incisions:  Right lower extremity bypass incisions clean, dry and intact. Right foot TMA stump, bandage with bloody drainage, stump bleeding, otherwise intact, tender, no ischemia of suture line Extremities:  Palpable pulse in RLE bypass graft, Doppler PT faint, DP and peroneal. Right leg/foot warm. 3 cm x 4 cm pressure ulcer right heel. 2 + right femoral pulse. Left femoral pulse 2+ Abdomen:  Soft, non tender Neurologic: Alert and oriented  CBC    Component Value Date/Time   WBC 15.5 (H) 04/30/2019 0314   RBC 3.05 (L) 04/30/2019 0314   HGB 10.1 (L) 04/30/2019 0314   HGB 11.4 (L) 11/12/2018 1637   HGB 10.8 (L) 03/14/2017 0921   HCT 32.2 (L) 04/30/2019 0314   HCT 35.0 (L) 11/12/2018 1637   HCT 32.5 (L) 03/14/2017 0921   PLT 134 (L) 04/30/2019 0314   PLT 51 (LL) 11/12/2018 1637   MCV 105.6 (H) 04/30/2019 0314   MCV 103 (H) 11/12/2018 1637   MCV 106.2 (H) 03/14/2017 0921   MCH 33.1 04/30/2019 0314   MCHC 31.4 04/30/2019 0314   RDW 14.0 04/30/2019 0314   RDW 14.6 11/12/2018 1637   RDW 14.6 03/14/2017 0921   LYMPHSABS 1.0 11/12/2018 1637   LYMPHSABS 0.8 (L) 03/14/2017 0921   MONOABS 0.4 10/20/2018 0638   MONOABS 0.6 03/14/2017 0921   EOSABS 0.2 11/12/2018 1637   BASOSABS 0.0 11/12/2018 1637   BASOSABS 0.0 03/14/2017 0921    BMET    Component Value  Date/Time   NA 138 04/30/2019 0314   NA 135 07/14/2018 0909   NA 141 03/14/2017 0921   K 5.3 (H) 04/30/2019 0314   K 4.1 03/14/2017 0921   CL 96 (L) 04/30/2019 0314   CO2 26 04/30/2019 0314   CO2 33 (H) 03/14/2017 0921   GLUCOSE 123 (H) 04/30/2019 0314   GLUCOSE 144 (H) 03/14/2017 0921   BUN 22 04/30/2019 0314   BUN 76 (HH) 07/14/2018 0909   BUN 30.1 (H) 03/14/2017 0921   CREATININE 6.18 (H) 04/30/2019 0314   CREATININE 9.16 (HH) 09/17/2017 0954   CREATININE 8.1 (HH) 03/14/2017 0921   CALCIUM 8.3 (L) 04/30/2019 0314   CALCIUM 9.5 03/14/2017 0921   GFRNONAA 9 (L) 04/30/2019 0314   GFRNONAA 5 (L) 09/17/2017 0954   GFRNONAA 24 (L) 06/30/2014 1528   GFRAA 10 (L) 04/30/2019 0314   GFRAA 6 (L) 09/17/2017 0954   GFRAA 28 (L) 06/30/2014 1528    INR    Component Value Date/Time   INR 1.2 04/29/2019 0713   INR 1.10 (L) 01/21/2015 0809     Intake/Output Summary (Last 24 hours) at 04/30/2019 0736 Last data filed at 04/29/2019 2119 Gross per 24 hour  Intake 450 ml  Output 1505 ml  Net -1055 ml     Assessment/Plan:  69 y.o. male is s/p right transmetatarsal amputation 1 Day Post-Op. Having a lot of pain right leg and foot has had some Dilaudid and Oxycodone this morning. Low grade fever over night. Right TMA stump intact, well appearing. Right Fem to below knee popliteal intact and doing well with adequate perfusion of RLE. Continue hemodialysis per Nephrology. PT/OT today. Continue to monitor R foot TMA stump. Will likely be able to d/c tomorrow once pain better controlled  DVT prophylaxis: SQ Heparin    Karoline Caldwell, PA-C [facility] [phone] 04/30/2019 7:36 AM   I have independently interviewed and examined patient and agree with PA assessment and plan above.   Jose Garrett C. Donzetta Matters, MD [facility] [place] Office: [phone] Pager: [phone]

## 2019-04-30 NOTE — Evaluation (Signed)
Physical Therapy Evaluation Patient Details Name: Jose Garrett MRN: [id number] DOB: 22-Sep-1950 Today's Date: 04/30/2019   History of Present Illness  Pt is a 69 y.o. male with recent hx of R LE bypass (04/10/19), now admitted 04/29/19 with progressive gangrenous changes of toes and dorsum of foot. S/p R transmet amputation 2/17. PMH includes aortic stenosis, HTN, HLD, CHF, CAD, PD, anxiety, ESRD (HD MWF).     Clinical Impression  Pt presents with an overall decrease in functional mobility secondary to above. PTA, pt lives alone but has been staying with friend for additional support, mod indep ambulator with RW although limited by LE pain. Educ on precautions, positioning, darco shoe wear and importance of mobility. Today, pt able to initiate transfer and limited gait training with RW and up to [place]. Discussed recommendation for SNF-level therapies to maximize functional mobility and independence prior to return home; pt undecided about SNF vs. return home.    Follow Up Recommendations SNF;Supervision/Assistance - 24 hour (pt undecided)    Equipment Recommendations  None recommended by PT    Recommendations for Other Services       Precautions / Restrictions Precautions Precautions: Fall Required Braces or Orthoses: Other Brace Other Brace: darco shoe R  Restrictions Weight Bearing Restrictions: Yes RLE Weight Bearing: Partial weight bearing RLE Partial Weight Bearing Percentage or Pounds: heel weight bearing with darco shoe Other Position/Activity Restrictions: (shoe not present during eval, maintained NWB)      Mobility  Bed Mobility Overal bed mobility: Modified Independent Bed Mobility: Supine to Sit     Supine to sit: Modified independent (Device/Increase time)     General bed mobility comments: increasd time and effort, HOB elevated  Transfers Overall transfer level: Needs assistance Equipment used: Rolling walker (2 wheeled) Transfers: Sit to/from [employer] Sit to Stand: Min assist;+2 safety/equipment Stand pivot transfers: Mod assist;+2 safety/equipment       General transfer comment: MinA to assist trunk elevation from EOB, min guard to stand from recliner with armrests support, modA to maintain balance transitioning UE support from recliner to RW  Ambulation/Gait Ambulation/Gait assistance: Min assist;+2 safety/equipment Gait Distance (Feet): 4 Feet Assistive device: Rolling walker (2 wheeled)     Gait velocity interpretation: <1.31 ft/sec, indicative of household ambulator General Gait Details: Darco shoe not present, therefore attempted ambulation with RLE NWB, pt with difficulty keeping weight off despite BUE support on RW; able to take a few complete hops with L foot, but quick to fatigue reverting to scooting on L foot  Stairs            Wheelchair Mobility    Modified Rankin (Stroke Patients Only)       Balance Overall balance assessment: Needs assistance Sitting-balance support: No upper extremity supported;Feet supported Sitting balance-Leahy Scale: Good     Standing balance support: Bilateral upper extremity supported;During functional activity Standing balance-Leahy Scale: Poor Standing balance comment: relaint on BUE and external support                             Pertinent Vitals/Pain Pain Assessment: 0-10 Pain Score: 7  Faces Pain Scale: Hurts whole lot Pain Location: RLE Pain Descriptors / Indicators: Discomfort;Guarding;Operative site guarding;Sore Pain Intervention(s): Monitored during session;Limited activity within patient's tolerance;Repositioned    Home Living Family/patient expects to be discharged to:: Private residence Living Arrangements: Non-relatives/Friends(available until March 1) Available Help at Discharge: Friend(s);Available 24 hours/day Type of Home: House Home Access: Stairs  to enter;Ramped entrance Entrance Stairs-Rails: Left Entrance  Stairs-Number of Steps: 2 Home Layout: One level Home Equipment: Cane - single point;Walker - 2 wheels;Bedside commode;Wheelchair - manual Additional Comments: setup above is for friend's home    Prior Function Level of Independence: Independent with assistive device(s)         Comments: Using RW for mobility since recent LE intervention. Independent with ADLs, has been sponge bathing only. Assist from friend for IADLs     Hand Dominance   Dominant Hand: Right    Extremity/Trunk Assessment   Upper Extremity Assessment Upper Extremity Assessment: Generalized weakness    Lower Extremity Assessment Lower Extremity Assessment: RLE deficits/detail;Generalized weakness RLE Deficits / Details: s/p R transmet amputation; pt also reports wound on R heel; hip and knee functionally >3/5 RLE Sensation: decreased light touch LLE Sensation: decreased light touch       Communication   Communication: No difficulties  Cognition Arousal/Alertness: Awake/alert Behavior During Therapy: WFL for tasks assessed/performed Overall Cognitive Status: Impaired/Different from baseline Area of Impairment: Awareness;Problem solving;Safety/judgement                         Safety/Judgement: Decreased awareness of safety Awareness: Emergent Problem Solving: Slow processing;Requires verbal cues;Difficulty sequencing General Comments: patient with decreased safety awareness, problem sovling, and need for assist (initally reports needing more rehab, but then reports plan to go home)       General Comments General comments (skin integrity, edema, etc.): VSS on RA    Exercises     Assessment/Plan    PT Assessment Patient needs continued PT services  PT Problem List Decreased strength;Decreased mobility;Pain;Impaired sensation;Decreased balance;Decreased activity tolerance;Decreased skin integrity;Decreased knowledge of precautions       PT Treatment Interventions Therapeutic  activities;Gait training;Therapeutic exercise;Patient/family education;Balance training;Functional mobility training;Stair training;DME instruction    PT Goals (Current goals can be found in the Care Plan section)  Acute Rehab PT Goals Patient Stated Goal: Trying to decide between rehab vs home PT Goal Formulation: With patient Time For Goal Achievement: 05/14/19 Potential to Achieve Goals: Good    Frequency Min 3X/week   Barriers to discharge        Co-evaluation PT/OT/SLP Co-Evaluation/Treatment: Yes Reason for Co-Treatment: For patient/therapist safety;To address functional/ADL transfers PT goals addressed during session: Mobility/safety with mobility;Balance;Proper use of DME OT goals addressed during session: ADL's and self-care       AM-PAC PT "6 Clicks" Mobility  Outcome Measure Help needed turning from your back to your side while in a flat bed without using bedrails?: None Help needed moving from lying on your back to sitting on the side of a flat bed without using bedrails?: None Help needed moving to and from a bed to a chair (including a wheelchair)?: A Lot Help needed standing up from a chair using your arms (e.g., wheelchair or bedside chair)?: A Lot Help needed to walk in hospital room?: A Lot Help needed climbing 3-5 steps with a railing? : A Lot 6 Click Score: 16    End of Session Equipment Utilized During Treatment: Gait belt Activity Tolerance: Patient tolerated treatment well;Patient limited by pain Patient left: in chair;with call bell/phone within reach;with chair alarm set Nurse Communication: Mobility status PT Visit Diagnosis: Other abnormalities of gait and mobility (R26.89)    Time: 4193-7902 PT Time Calculation (min) (ACUTE ONLY): 24 min   Charges:   PT Evaluation $PT Eval Moderate Complexity: 1 Mod     Mabeline Caras, PT, DPT  Acute Rehabilitation Services  Pager [phone] Office [place] 04/30/2019, 1:18  PM

## 2019-04-30 NOTE — Evaluation (Signed)
Occupational Therapy Evaluation Patient Details Name: Jose Garrett MRN: [id number] DOB: 1950-10-14 Today's Date: 04/30/2019    History of Present Illness Pt is a 69 y.o. male with recent hx of R LE bypass (04/10/19), now admitted 04/29/19 with progressive gangrenous changes of toes and dorsum of foot. S/p R transmet amputation 2/17. PMH includes aortic stenosis, HTN, HLD, CHF, CAD, PD, anxiety, ESRD (HD MWF).    Clinical Impression   PTA patient reports independent with ADLs, mobility using RW and assist from friend for IADls.  Admitted for above and limited by problem list below, including impaired balance, decreased activity tolerance, generalized weakness, pain in R LE, and decreased safety awareness.  Patient currently requires min- mod assist +2 safety for transfers, setup for UB ADLs and min-mod assist for LB ADLs.  Maintained NWB R LE during session as darco shoe not present, but called [employer] after session.  Patient requires min cueing for safety awareness, sequencing and problem solving during session; decreased awareness to deficits as patient initially reports need for rehab, but then reports wanting to try to dc home alone (not even to friends home).  Will follow acutely as patient will benefit from continued OT services to optimize independence and safety with ADLs, mobility.  At this time recommend SNF at dc.     Follow Up Recommendations  SNF;Supervision/Assistance - 24 hour    Equipment Recommendations  None recommended by OT    Recommendations for Other Services       Precautions / Restrictions Precautions Precautions: Fall Required Braces or Orthoses: Other Brace Other Brace: darco shoe R  Restrictions Weight Bearing Restrictions: Yes RLE Weight Bearing: Partial weight bearing RLE Partial Weight Bearing Percentage or Pounds: heel weight bearing with darco shoe Other Position/Activity Restrictions: (shoe not present during eval, maintained NWB)      Mobility Bed  Mobility Overal bed mobility: Needs Assistance Bed Mobility: Supine to Sit     Supine to sit: Supervision     General bed mobility comments: increasd time and effort, but supervision for safety   Transfers Overall transfer level: Needs assistance Equipment used: Rolling walker (2 wheeled) Transfers: Sit to/from [facility] Sit to Stand: Min assist;+2 physical assistance;+2 safety/equipment Stand pivot transfers: Mod assist;+2 safety/equipment       General transfer comment: cueing for technique and hand placement, maintaining non weight bearing through R LE due to darco shoe not present; requires +2 for safety. Increased assist for 2nd transfer due to pain    Balance Overall balance assessment: Needs assistance Sitting-balance support: No upper extremity supported;Feet supported Sitting balance-Leahy Scale: Good     Standing balance support: Bilateral upper extremity supported;During functional activity Standing balance-Leahy Scale: Poor Standing balance comment: relaint on BUE and external support                           ADL either performed or assessed with clinical judgement   ADL Overall ADL's : Needs assistance/impaired     Grooming: Set up;Sitting   Upper Body Bathing: Set up;Sitting   Lower Body Bathing: Minimal assistance;Sit to/from stand Lower Body Bathing Details (indicate cue type and reason): able to reach L LE, but not R; min-mod assist to stand  Upper Body Dressing : Set up;Sitting   Lower Body Dressing: Moderate assistance;Sit to/from stand;+2 for safety/equipment Lower Body Dressing Details (indicate cue type and reason): min-mod assist to stand, requires assist to reach R LE limited by pain  Toilet  Transfer: Minimal assistance;+2 for safety/equipment;Moderate assistance;Stand-pivot Toilet Transfer Details (indicate cue type and reason): RW, simulated to recliner         Functional mobility during ADLs: Minimal  assistance;Moderate assistance;+2 for safety/equipment;Rolling walker;Cueing for sequencing;Cueing for safety General ADL Comments: pt limted by decreased activity tolerance, pain in R LE, impaired balance with maintaining weight off R LE (darco shoe not present), and decreased awareness      Vision Baseline Vision/History: Wears glasses Wears Glasses: At all times       Perception     Praxis      Pertinent Vitals/Pain Pain Assessment: Faces Faces Pain Scale: Hurts whole lot Pain Location: RLE Pain Descriptors / Indicators: Discomfort;Guarding;Operative site guarding;Sore Pain Intervention(s): Monitored during session;Repositioned;Limited activity within patient's tolerance     Hand Dominance Right   Extremity/Trunk Assessment Upper Extremity Assessment Upper Extremity Assessment: Generalized weakness   Lower Extremity Assessment Lower Extremity Assessment: Defer to PT evaluation(s/p R transmet amputation)       Communication Communication Communication: No difficulties   Cognition Arousal/Alertness: Awake/alert Behavior During Therapy: WFL for tasks assessed/performed Overall Cognitive Status: Impaired/Different from baseline Area of Impairment: Awareness;Problem solving;Safety/judgement                         Safety/Judgement: Decreased awareness of safety Awareness: Emergent Problem Solving: Slow processing;Requires verbal cues;Difficulty sequencing General Comments: patient with decreased safety awareness, problem sovling, and need for assist (initally reports needing more rehab, but then reports plan to go home)    General Comments  VSS     Exercises     Shoulder Instructions      Home Living Family/patient expects to be discharged to:: Private residence Living Arrangements: Non-relatives/Friends(available until march 1st ) Available Help at Discharge: Friend(s);Available 24 hours/day Type of Home: House Home Access: Stairs to enter;Ramped  entrance Entrance Stairs-Number of Steps: 2 Entrance Stairs-Rails: Left Home Layout: One level     Bathroom Shower/Tub: Teacher, early years/pre: Standard     Home Equipment: Cane - single point;Walker - 2 wheels;Bedside commode   Additional Comments: setup above is for friends home       Prior Functioning/Environment Level of Independence: Independent with assistive device(s)        Comments: using RW for mobility, indepednet with ADLs, has been sponge bathing only; assist for IADLs         OT Problem List: Decreased strength;Decreased activity tolerance;Impaired balance (sitting and/or standing);Pain;Decreased knowledge of use of DME or AE;Decreased knowledge of precautions;Decreased safety awareness      OT Treatment/Interventions: Self-care/ADL training;Therapeutic exercise;Energy conservation;DME and/or AE instruction;Balance training;Patient/family education;Therapeutic activities    OT Goals(Current goals can be found in the care plan section) Acute Rehab OT Goals Patient Stated Goal: to go home OT Goal Formulation: With patient Time For Goal Achievement: 05/14/19 Potential to Achieve Goals: Good  OT Frequency: Min 2X/week   Barriers to D/C:            Co-evaluation PT/OT/SLP Co-Evaluation/Treatment: Yes Reason for Co-Treatment: To address functional/ADL transfers;For patient/therapist safety   OT goals addressed during session: ADL's and self-care      AM-PAC OT "6 Clicks" Daily Activity     Outcome Measure Help from another person eating meals?: None Help from another person taking care of personal grooming?: A Little Help from another person toileting, which includes using toliet, bedpan, or urinal?: A Little Help from another person bathing (including washing, rinsing, drying)?: A Little Help from  another person to put on and taking off regular upper body clothing?: None Help from another person to put on and taking off regular lower body  clothing?: A Lot 6 Click Score: 19   End of Session Equipment Utilized During Treatment: Rolling walker;Gait belt Nurse Communication: Mobility status;Other (comment)(darco shoe ordered)  Activity Tolerance: Patient tolerated treatment well Patient left: in chair;with call bell/phone within reach;with chair alarm set  OT Visit Diagnosis: Other abnormalities of gait and mobility (R26.89);Muscle weakness (generalized) (M62.81);Pain Pain - Right/Left: Right Pain - part of body: Ankle and joints of foot                Time: 1040-1103 OT Time Calculation (min): 23 min Charges:  OT General Charges $OT Visit: 1 Visit OT Evaluation $OT Eval Moderate Complexity: 1 Mod  Jolaine Artist, OT Acute Rehabilitation Services Pager [phone] Office [phone]    Delight Stare 04/30/2019, 12:33 PM

## 2019-04-30 NOTE — Progress Notes (Signed)
Orthopedic Tech Progress Note Patient Details:  Jose Garrett 03-25-1950 [id number] RN requested a [place] for patient.[name] Devices Type of Ortho Device: Darco shoe Ortho Device/Splint Location: RLE Ortho Device/Splint Interventions: Ordered, Application   Post Interventions Patient Tolerated: Well Instructions Provided: Care of device, Adjustment of device   [name] 04/30/2019, 11:42 AM

## 2019-05-01 ENCOUNTER — Encounter: Payer: Medicare Other | Admitting: Vascular Surgery

## 2019-05-01 LAB — CBC
HCT: 26.6 % — ABNORMAL LOW (ref 39.0–52.0)
Hemoglobin: 8.3 g/dL — ABNORMAL LOW (ref 13.0–17.0)
MCH: 33.2 pg (ref 26.0–34.0)
MCHC: 31.2 g/dL (ref 30.0–36.0)
MCV: 106.4 fL — ABNORMAL HIGH (ref 80.0–100.0)
Platelets: 105 10*3/uL — ABNORMAL LOW (ref 150–400)
RBC: 2.5 MIL/uL — ABNORMAL LOW (ref 4.22–5.81)
RDW: 14 % (ref 11.5–15.5)
WBC: 11.8 10*3/uL — ABNORMAL HIGH (ref 4.0–10.5)
nRBC: 0 % (ref 0.0–0.2)

## 2019-05-01 LAB — BASIC METABOLIC PANEL
Anion gap: 19 — ABNORMAL HIGH (ref 5–15)
BUN: 42 mg/dL — ABNORMAL HIGH (ref 8–23)
CO2: 26 mmol/L (ref 22–32)
Calcium: 8.4 mg/dL — ABNORMAL LOW (ref 8.9–10.3)
Chloride: 92 mmol/L — ABNORMAL LOW (ref 98–111)
Creatinine, Ser: 8.04 mg/dL — ABNORMAL HIGH (ref 0.61–1.24)
GFR calc Af Amer: 7 mL/min — ABNORMAL LOW (ref >60)
GFR calc non Af Amer: 6 mL/min — ABNORMAL LOW (ref >60)
Glucose, Bld: 104 mg/dL — ABNORMAL HIGH (ref 70–99)
Potassium: 5.1 mmol/L (ref 3.5–5.1)
Sodium: 137 mmol/L (ref 135–145)

## 2019-05-01 MED ORDER — SODIUM CHLORIDE 0.9 % IV SOLN: 100.0000 mL | INTRAVENOUS | Status: DC | PRN

## 2019-05-01 MED ORDER — DARBEPOETIN ALFA 100 MCG/0.5ML IJ SOSY
PREFILLED_SYRINGE | INTRAMUSCULAR | Status: AC
  Administered 2019-05-01: 18:00:00 100 ug via INTRAVENOUS
  Filled 2019-05-01: qty 0.5

## 2019-05-01 MED ORDER — LIDOCAINE-PRILOCAINE 2.5-2.5 % EX CREA
1.0000 "application " | TOPICAL_CREAM | CUTANEOUS | Status: DC | PRN
  Filled 2019-05-01: qty 5

## 2019-05-01 MED ORDER — PENTAFLUOROPROP-TETRAFLUOROETH EX AERO: 1.0000 "application " | INHALATION_SPRAY | CUTANEOUS | Status: DC | PRN

## 2019-05-01 MED ORDER — DARBEPOETIN ALFA 100 MCG/0.5ML IJ SOSY
100.0000 ug | PREFILLED_SYRINGE | INTRAMUSCULAR | Status: DC
  Administered 2019-05-08 – 2019-05-15 (×2): 100 ug via INTRAVENOUS
  Filled 2019-05-01 (×4): qty 0.5

## 2019-05-01 MED ORDER — LIDOCAINE HCL (PF) 1 % IJ SOLN: 5.0000 mL | INTRAMUSCULAR | Status: DC | PRN

## 2019-05-01 MED ORDER — MIDODRINE HCL 5 MG PO TABS
ORAL_TABLET | ORAL | Status: AC
  Administered 2019-05-01: 18:00:00 10 mg via ORAL
  Filled 2019-05-01: qty 2

## 2019-05-01 NOTE — Progress Notes (Signed)
Physical Therapy Treatment Patient Details Name: Jose Garrett MRN: [id number] DOB: 1950/05/31 Today's Date: 05/01/2019    History of Present Illness Pt is a 69 y.o. male with recent hx of R LE bypass (04/10/19), now admitted 04/29/19 with progressive gangrenous changes of toes and dorsum of foot. S/p R transmet amputation 2/17. PMH includes aortic stenosis, HTN, HLD, CHF, CAD, PD, anxiety, ESRD (HD MWF).     PT Comments    Patient is making progress toward PT goals and tolerated gait distance of 15 ft before fatigued. Continue to progress as tolerated with anticipated d/c to SNF for further skilled PT services.     Follow Up Recommendations  SNF;Supervision/Assistance - 24 hour     Equipment Recommendations  None recommended by PT    Recommendations for Other Services       Precautions / Restrictions Precautions Precautions: Fall Other Brace: darco shoe R  Restrictions Weight Bearing Restrictions: No RLE Weight Bearing: Partial weight bearing RLE Partial Weight Bearing Percentage or Pounds: wieght bearing through heel with darco shoe    Mobility  Bed Mobility Overal bed mobility: Modified Independent Bed Mobility: Supine to Sit           General bed mobility comments: increasd time and effort, use of rail, and HOB elevated  Transfers Overall transfer level: Needs assistance Equipment used: Rolling walker (2 wheeled) Transfers: Sit to/from Stand Sit to Stand: Min assist;+2 safety/equipment         General transfer comment: cues for safe hand placement and positioning of bilat LE prior to stand; assist to power up into standing   Ambulation/Gait Ambulation/Gait assistance: Min assist;+2 safety/equipment Gait Distance (Feet): 15 Feet Assistive device: Rolling walker (2 wheeled) Gait Pattern/deviations: Step-to pattern;Trunk flexed;Decreased weight shift to right;Decreased stance time - right;Decreased step length - left Gait velocity: decr   General Gait  Details: mod cues for weight bearing on R heel and sequencing; assist to steady; distance limited due to fatigue; chair follow   Stairs             Wheelchair Mobility    Modified Rankin (Stroke Patients Only)       Balance Overall balance assessment: Needs assistance Sitting-balance support: No upper extremity supported;Feet supported Sitting balance-Leahy Scale: Good     Standing balance support: Bilateral upper extremity supported;During functional activity Standing balance-Leahy Scale: Poor                              Cognition Arousal/Alertness: Awake/alert Behavior During Therapy: WFL for tasks assessed/performed Overall Cognitive Status: Within Functional Limits for tasks assessed                                 General Comments: WFL for simple mobility tasks      Exercises      General Comments        Pertinent Vitals/Pain Pain Assessment: Faces Faces Pain Scale: Hurts even more Pain Location: RLE Pain Descriptors / Indicators: Sore;Grimacing;Guarding Pain Intervention(s): Limited activity within patient's tolerance;Monitored during session;Repositioned;Premedicated before session    Home Living                      Prior Function            PT Goals (current goals can now be found in the care plan section) Progress towards PT goals: Progressing toward  goals    Frequency    Min 3X/week      PT Plan Discharge plan needs to be updated    Co-evaluation              AM-PAC PT "6 Clicks" Mobility   Outcome Measure  Help needed turning from your back to your side while in a flat bed without using bedrails?: None Help needed moving from lying on your back to sitting on the side of a flat bed without using bedrails?: A Little Help needed moving to and from a bed to a chair (including a wheelchair)?: A Little Help needed standing up from a chair using your arms (e.g., wheelchair or bedside chair)?:  A Little Help needed to walk in hospital room?: A Little Help needed climbing 3-5 steps with a railing? : A Lot 6 Click Score: 18    End of Session Equipment Utilized During Treatment: Gait belt Activity Tolerance: Patient tolerated treatment well Patient left: in chair;with call bell/phone within reach;with chair alarm set Nurse Communication: Mobility status PT Visit Diagnosis: Other abnormalities of gait and mobility (R26.89) Pain - Right/Left: Right Pain - part of body: Leg     Time: 0722-5750 PT Time Calculation (min) (ACUTE ONLY): 19 min  Charges:  $Gait Training: 8-22 mins                     Earney Navy, PTA Acute Rehabilitation Services Pager: [phone] Office: [phone]     Darliss Cheney 05/01/2019, 3:58 PM

## 2019-05-01 NOTE — Progress Notes (Signed)
CSW unable to complete assessment-CSW was informed by RN patient was at dialysis.    CSW will continue to follow.   Thurmond Butts, MSW, [place] Clinical Social Worker

## 2019-05-01 NOTE — Plan of Care (Signed)
Continue to monitor

## 2019-05-01 NOTE — Discharge Instructions (Signed)
[facility]  Discharge instructions  Lower Extremity Bypass Surgery  Please refer to the following instruction for your post-procedure care. Your surgeon or physician assistant will discuss any changes with you.  Activity  You are encouraged to walk as much as you can. You can slowly return to normal activities during the month after your surgery. Avoid strenuous activity and heavy lifting until your doctor tells you it's OK. Avoid activities such as vacuuming or swinging a golf club. Do not drive until your doctor give the OK and you are no longer taking prescription pain medications. It is also normal to have difficulty with sleep habits, eating and bowel movement after surgery. These will go away with time.  Bathing/Showering  You may shower after you go home. Do not soak in a bathtub, hot tub, or swim until the incision heals completely.  Incision Care  Clean your incision with mild soap and water. Shower every day. Pat the area dry with a clean towel. You do not need a bandage unless otherwise instructed. Do not apply any ointments or creams to your incision. If you have open wounds you will be instructed how to care for them or a visiting nurse may be arranged for you. If you have staples or sutures along your incision they will be removed at your post-op appointment. You may have skin glue on your incision. Do not peel it off. It will come off on its own in about one week. If you have a great deal of moisture in your groin, use a gauze help keep this area dry.  Diet  Resume your normal diet. There are no special food restrictions following this procedure. A low fat/ low cholesterol diet is recommended for all patients with vascular disease. In order to heal from your surgery, it is CRITICAL to get adequate nutrition. Your body requires vitamins, minerals, and protein. Vegetables are the best source of vitamins and minerals. Vegetables also provide the  perfect balance of protein. Processed food has little nutritional value, so try to avoid this.  Medications  Resume taking all your medications unless your doctor or nurse practitioner tells you not to. If your incision is causing pain, you may take over-the-counter pain relievers such as acetaminophen (Tylenol). If you were prescribed a stronger pain medication, please aware these medication can cause nausea and constipation. Prevent nausea by taking the medication with a snack or meal. Avoid constipation by drinking plenty of fluids and eating foods with high amount of fiber, such as fruits, vegetables, and grains. Take Colase 100 mg (an over-the-counter stool softener) twice a day as needed for constipation. Do not take Tylenol if you are taking prescription pain medications.  Follow Up  Our office will schedule a follow up appointment 2-3 weeks following discharge.  Please call us immediately for any of the following conditions  .Severe or worsening pain in your legs or feet while at rest or while walking .Increase pain, redness, warmth, or drainage (pus) from your incision site(s) Fever of 101 degree or higher The swelling in your leg with the bypass suddenly worsens and becomes more painful than when you were in the hospital If you have been instructed to feel your graft pulse then you should do so every day. If you can no longer feel this pulse, call the office immediately. Not all patients are given this instruction.  Leg swelling is common after leg bypass surgery.  The swelling should improve over a few months  following surgery. To improve the swelling, you may elevate your legs above the level of your heart while you are sitting or resting. Your surgeon or physician assistant may ask you to apply an ACE wrap or wear compression (TED) stockings to help to reduce swelling.  Reduce your risk of vascular disease  Stop smoking. If you would like help call [facility] at 1-800-QUIT-NOW  403-224-2508) or [place] at [phone].  Manage your cholesterol Maintain a desired weight Control your diabetes weight Control your diabetes Keep your blood pressure down  If you have any questions, please call the office at [phone]  Ambulate with limited weight bearing right lower extremity and Darco Shoe

## 2019-05-01 NOTE — Progress Notes (Addendum)
Progress Note    05/01/2019 7:27 AM 2 Days Post-Op  Subjective:  No events overnight. Pain somewhat improved but still needing po pain medication q 4 hrs. Was able to get up and ambulate with PT yesterday. He expresses concerns about going home or going to SNF with his pain and also says he feels he will get better PT here. States he fell asleep yesterday when he tried to contact his family so he wishes to speak with them today. No other complaints   Vitals:   04/30/19 2032 05/01/19 0450  BP: (!) 120/47 (!) 145/47  Pulse: 81 85  Resp: 20 16  Temp: 100.1 F (37.8 C) 98.6 F (37 C)  SpO2: 98% 93%   Physical Exam: General: well appearing, not in any acute discomfort Lungs:  Non labored Incisions: right lower extremity bypass incisions clean, dry, intact. Right TMA dressings bloody, no increased bleeding on compression, some dorsal erythema along medial border of suture line, sutures intact, no necrosis or maceration along suture line Extremities:  2+ femoral pulse, palpable DP, Dopper PT, DP, Peroneal Abdomen:  Soft, non tender Neurologic: Alert and oriented  CBC    Component Value Date/Time   WBC 11.8 (H) 05/01/2019 0222   RBC 2.50 (L) 05/01/2019 0222   HGB 8.3 (L) 05/01/2019 0222   HGB 11.4 (L) 11/12/2018 1637   HGB 10.8 (L) 03/14/2017 0921   HCT 26.6 (L) 05/01/2019 0222   HCT 35.0 (L) 11/12/2018 1637   HCT 32.5 (L) 03/14/2017 0921   PLT 105 (L) 05/01/2019 0222   PLT 51 (LL) 11/12/2018 1637   MCV 106.4 (H) 05/01/2019 0222   MCV 103 (H) 11/12/2018 1637   MCV 106.2 (H) 03/14/2017 0921   MCH 33.2 05/01/2019 0222   MCHC 31.2 05/01/2019 0222   RDW 14.0 05/01/2019 0222   RDW 14.6 11/12/2018 1637   RDW 14.6 03/14/2017 0921   LYMPHSABS 1.0 11/12/2018 1637   LYMPHSABS 0.8 (L) 03/14/2017 0921   MONOABS 0.4 10/20/2018 0638   MONOABS 0.6 03/14/2017 0921   EOSABS 0.2 11/12/2018 1637   BASOSABS 0.0 11/12/2018 1637   BASOSABS 0.0 03/14/2017 0921    BMET    Component  Value Date/Time   NA 137 05/01/2019 0222   NA 135 07/14/2018 0909   NA 141 03/14/2017 0921   K 5.1 05/01/2019 0222   K 4.1 03/14/2017 0921   CL 92 (L) 05/01/2019 0222   CO2 26 05/01/2019 0222   CO2 33 (H) 03/14/2017 0921   GLUCOSE 104 (H) 05/01/2019 0222   GLUCOSE 144 (H) 03/14/2017 0921   BUN 42 (H) 05/01/2019 0222   BUN 76 (HH) 07/14/2018 0909   BUN 30.1 (H) 03/14/2017 0921   CREATININE 8.04 (H) 05/01/2019 0222   CREATININE 9.16 (HH) 09/17/2017 0954   CREATININE 8.1 (HH) 03/14/2017 0921   CALCIUM 8.4 (L) 05/01/2019 0222   CALCIUM 9.5 03/14/2017 0921   GFRNONAA 6 (L) 05/01/2019 0222   GFRNONAA 5 (L) 09/17/2017 0954   GFRNONAA 24 (L) 06/30/2014 1528   GFRAA 7 (L) 05/01/2019 0222   GFRAA 6 (L) 09/17/2017 0954   GFRAA 28 (L) 06/30/2014 1528    INR    Component Value Date/Time   INR 1.2 04/29/2019 0713   INR 1.10 (L) 01/21/2015 0809     Intake/Output Summary (Last 24 hours) at 05/01/2019 0727 Last data filed at 04/30/2019 1300 Gross per 24 hour  Intake 300 ml  Output --  Net 300 ml     Assessment/Plan:  69 y.o. male is s/p right TMA 2 Days Post-Op. TMA stump well appearing, clean, intact. Pain is improved from yesterday. Right Fem to below knee popliteal with continued adequate perfusion of RLE. HD today.Continue PT/OT.  Recommending SNF, however patient is undecided but CM will follow up regarding SNF placement. He wants to speak with his family today. Continue to float right heel to avoid pressure to heel. Will continue to monitor TMA stump healing  DVT prophylaxis:  SQ Heparin   Karoline Caldwell, PA-C [facility] [phone] 05/01/2019 7:27 AM   I have independently interviewed and examined patient and agree with PA assessment and plan above.  Patient has agreed to SNF we will get that paperwork started  Tina C. Donzetta Matters, MD [facility] [place] Office: [phone] Pager: [phone]

## 2019-05-01 NOTE — Consult Note (Signed)
Acknowledging PV Navigator consult. Chart reviewed in CHL.   69 y/o male s/p 2 days R TMA amputation and R Fem-Pop bypass grafting 04/10/19.  Medical history includes CAD, CHF, ESRD w/ HD 3xwk, HLD, HTN, MI, PVD.  Receiving medical management for pain control and rehab services. PT/OT evaluations continue to recommend SNF at discharge for continued strengthening and safety strategies. Patient plans to discuss with his family.  Will follow as patient transitions to identify any barriers/needs.  Thank you, Cletis Media RN BSN CWS [place] [phone]

## 2019-05-01 NOTE — Progress Notes (Addendum)
  Jose Garrett Progress Note   Assessment/ Plan:   Dialysis Orders:  MWF at [facility] 4hr, 400/800, EDW 76.5kg, 2K/2Ca, UFP #4, AVF, heparin 2400 - Parsabiv 5mg  IV q HD - Calcitriol 1.59mcg PO q HD - Mircera 72mcg IV q 2 weeks (last given 3mcg on 1/20, Hgb 10.3 on 2/10)  Assessment/Plan: 1.  PAD with gangrenous changes -> R TMA on 2/17 with Dr. Donzetta Matters. 2.  ESRD: Continue HD per usual MWF sched - s/p HD 2/17, next planned today 2/19.  K 5.1 this AM.  Midodrine 10 mg pre-HD 3.  Hypertension/volume: BP up slightly - keep outpt EDW for now. 4.  Anemia: Hgb 10.3 - s/p mircera, 8.3 this AM, will give Aranesp today 5.  Metabolic bone disease: Ca ok, velphoro as binder 6.  Hx TAVR 7.  CAD/HFrEF  Subjective:    No events, plan for SNF.   Objective:   BP (!) 113/47 (BP Location: Left Arm)   Pulse 60   Temp 98 F (36.7 C) (Oral)   Resp 18   Ht 5\' 10"  (1.778 m)   Wt 77.6 kg   SpO2 90%   BMI 24.55 kg/m   Physical Exam: Gen: alert, NAD CVS: RRR Resp: clear anteriorly, no c/w/r Abd: soft Ext: RLE in sterile dressing ACCESS: R RC AVF + T/B  Labs: BMET Recent Labs  Lab 04/29/19 0713 04/30/19 0314 05/01/19 0222  NA 138 138 137  K 4.4 5.3* 5.1  CL 93* 96* 92*  CO2 22 26 26   GLUCOSE 96 123* 104*  BUN 49* 22 42*  CREATININE 9.57* 6.18* 8.04*  CALCIUM 8.4* 8.3* 8.4*   CBC Recent Labs  Lab 04/29/19 0713 04/30/19 0314 05/01/19 0222  WBC 10.0 15.5* 11.8*  HGB 10.3* 10.1* 8.3*  HCT 32.1* 32.2* 26.6*  MCV 105.6* 105.6* 106.4*  PLT 134* 134* 105*      Medications:    . amiodarone  200 mg Oral Daily  . atorvastatin  80 mg Oral Q supper  . carvedilol  3.125 mg Oral BID  . Chlorhexidine Gluconate Cloth  6 each Topical Q0600  . clopidogrel  75 mg Oral Q supper  . darbepoetin (ARANESP) injection - DIALYSIS  100 mcg Intravenous Q Fri-HD  . docusate sodium  100 mg Oral Daily  . heparin  5,000 Units Subcutaneous Q8H  . midodrine  10 mg Oral Q M,W,F-HD  .  multivitamin  1 tablet Oral QHS  . pantoprazole  40 mg Oral Daily  . sodium chloride flush  3 mL Intravenous Q12H  . sucroferric oxyhydroxide  1,250 mg Oral TID WC  . vitamin B-12  1,000 mcg Oral QPM     Madelon Lips, MD 05/01/2019, 12:11 PM

## 2019-05-01 NOTE — NC FL2 (Addendum)
[place] LEVEL OF CARE SCREENING TOOL     IDENTIFICATION  Patient Name: Jose Garrett Birthdate: [birth year] Sex: male Admission Date (Current Location): 04/29/2019  [facility] and [place] Number:  Herbalist and Address:  [facility] [place] [place] [address], [place], [place] [address]      Provider Number: [id number]  Attending Physician Name and Address:  [place], [place] Name and Phone Number:  [name] (daughter) [phone]    Current Level of Care: Hospital Recommended Level of Care: [place] Prior Approval Number:    Date Approved/Denied:   PASRR Number: [id number]  Discharge Plan: SNF    Current Diagnoses: Patient Active Problem List   Diagnosis Date Noted  . Polymorphic ventricular tachycardia ([place]) 10/24/2018  . S/P TAVR (transcatheter aortic valve replacement) 10/21/2018  . Anxiety   . Coronary artery disease involving native coronary artery of native heart without angina pectoris   . Thrombocytopenia ([place])   . PAD (peripheral artery disease) ([place]) 07/22/2018  . Acute on chronic combined systolic and diastolic CHF (congestive heart failure) ([place]) 11/30/2017  . ESRD (end stage renal disease) on dialysis ([place])   . Iron deficiency anemia 11/22/2015  . AL amyloidosis ([place]) 09/25/2014  . Anemia of chronic disease 09/25/2014  . Essential hypertension, benign 11/06/2013    Orientation RESPIRATION BLADDER Height & Weight     Self, Time, Situation, Place  Normal Continent Weight: 170 lb 10.2 oz (77.4 kg) Height:  5\' 10"  (177.8 cm)  BEHAVIORAL SYMPTOMS/MOOD NEUROLOGICAL BOWEL NUTRITION STATUS      Continent Diet(see discharge summary)  AMBULATORY STATUS COMMUNICATION OF NEEDS Skin   Limited Assist Verbally Other (Comment)(toe amputations, right leg closed surgicla incision)                       Personal Care Assistance Level of Assistance  Bathing, Dressing, Total care, Feeding  Bathing Assistance: Limited assistance Feeding assistance: Independent Dressing Assistance: Limited assistance Total Care Assistance: Limited assistance   Functional Limitations Info  Sight, Speech, Hearing Sight Info: Impaired Hearing Info: Adequate Speech Info: Adequate    SPECIAL CARE FACTORS FREQUENCY  PT (By licensed PT), OT (By licensed OT)     PT Frequency: min 5x weekly OT Frequency: min 5x weekly            Contractures Contractures Info: Not present    Additional Factors Info  Code Status, Allergies Code Status Info: full Allergies Info: No Known Allergies           Current Medications (05/01/2019):  This is the current hospital active medication list Current Facility-Administered Medications  Medication Dose Route Frequency Provider Last Rate Last Admin  . 0.9 %  sodium chloride infusion  250 mL Intravenous PRN Laurence Slate M, PA-C      . 0.9 %  sodium chloride infusion  100 mL Intravenous PRN Loren Racer, PA-C      . 0.9 %  sodium chloride infusion  100 mL Intravenous PRN Loren Racer, PA-C      . acetaminophen (TYLENOL) tablet 325-650 mg  325-650 mg Oral Q4H PRN Laurence Slate M, PA-C       Or  . acetaminophen (TYLENOL) suppository 325-650 mg  325-650 mg Rectal Q4H PRN Ulyses Amor, PA-C      . alum & mag hydroxide-simeth (MAALOX/MYLANTA) 200-200-20 MG/5ML suspension 15-30 mL  15-30 mL Oral Q2H PRN Ulyses Amor, PA-C      .  amiodarone (PACERONE) tablet 200 mg  200 mg Oral Daily Laurence Slate M, PA-C   200 mg at 05/01/19 1015  . atorvastatin (LIPITOR) tablet 80 mg  80 mg Oral Q supper Laurence Slate M, PA-C   80 mg at 04/30/19 1742  . bisacodyl (DULCOLAX) EC tablet 5 mg  5 mg Oral Daily PRN Laurence Slate M, PA-C      . carvedilol (COREG) tablet 3.125 mg  3.125 mg Oral BID Laurence Slate M, PA-C   3.125 mg at 04/30/19 2058  . Chlorhexidine Gluconate Cloth 2 % PADS 6 each  6 each Topical Q0600 Loren Racer, PA-C   6 each at 05/01/19 0522   . clopidogrel (PLAVIX) tablet 75 mg  75 mg Oral Q supper Laurence Slate M, PA-C   75 mg at 04/30/19 1742  . Darbepoetin Alfa (ARANESP) injection 100 mcg  100 mcg Intravenous Q Janit Bern, MD      . docusate sodium (COLACE) capsule 100 mg  100 mg Oral Daily Laurence Slate M, PA-C   100 mg at 05/01/19 1015  . guaiFENesin-dextromethorphan (ROBITUSSIN DM) 100-10 MG/5ML syrup 15 mL  15 mL Oral Q4H PRN Laurence Slate M, PA-C      . heparin injection 5,000 Units  5,000 Units Subcutaneous Q8H Laurence Slate M, PA-C   5,000 Units at 05/01/19 6789  . hydrALAZINE (APRESOLINE) injection 5 mg  5 mg Intravenous Q20 Min PRN Laurence Slate M, PA-C      . HYDROmorphone (DILAUDID) injection 0.5-1 mg  0.5-1 mg Intravenous Q2H PRN Laurence Slate M, PA-C   1 mg at 04/30/19 1327  . labetalol (NORMODYNE) injection 10 mg  10 mg Intravenous Q10 min PRN Laurence Slate M, PA-C      . lidocaine (PF) (XYLOCAINE) 1 % injection 5 mL  5 mL Intradermal PRN Loren Racer, PA-C      . lidocaine-prilocaine (EMLA) cream 1 application  1 application Topical PRN Loren Racer, PA-C      . LORazepam (ATIVAN) tablet 1 mg  1 mg Oral BID PRN Laurence Slate M, PA-C      . magnesium sulfate IVPB 2 g 50 mL  2 g Intravenous Daily PRN Laurence Slate M, PA-C      . methocarbamol (ROBAXIN) tablet 500 mg  500 mg Oral BID PRN Laurence Slate M, PA-C   500 mg at 04/29/19 1053  . metoprolol tartrate (LOPRESSOR) injection 2-5 mg  2-5 mg Intravenous Q2H PRN Laurence Slate M, PA-C      . midodrine (PROAMATINE) tablet 10 mg  10 mg Oral Q M,W,F-HD Laurence Slate M, PA-C      . multivitamin (RENA-VIT) tablet 1 tablet  1 tablet Oral QHS Ulyses Amor, PA-C   1 tablet at 04/30/19 2058  . ondansetron (ZOFRAN) injection 4 mg  4 mg Intravenous Q6H PRN Ulyses Amor, PA-C   4 mg at 04/29/19 2119  . oxyCODONE (Oxy IR/ROXICODONE) immediate release tablet 5-10 mg  5-10 mg Oral Q4H PRN Laurence Slate M, PA-C   10 mg at 05/01/19 1015  . pantoprazole  (PROTONIX) EC tablet 40 mg  40 mg Oral Daily Laurence Slate M, PA-C   40 mg at 05/01/19 1015  . pentafluoroprop-tetrafluoroeth (GEBAUERS) aerosol 1 application  1 application Topical PRN Stovall, [place], PA-C      . phenol (CHLORASEPTIC) mouth spray 1 spray  1 spray Mouth/Throat PRN Laurence Slate M, PA-C      . senna-docusate (Senokot-S) tablet 1  tablet  1 tablet Oral QHS PRN Ulyses Amor, PA-C      . sodium chloride flush (NS) 0.9 % injection 3 mL  3 mL Intravenous Q12H CollinsTerrence Dupont M, PA-C   3 mL at 05/01/19 1016  . sodium chloride flush (NS) 0.9 % injection 3 mL  3 mL Intravenous PRN Laurence Slate M, PA-C      . sucroferric oxyhydroxide [facility]) chewable tablet 1,250 mg  1,250 mg Oral TID WC Laurence Slate M, PA-C   1,250 mg at 04/30/19 1003  . temazepam (RESTORIL) capsule 15 mg  15 mg Oral QHS PRN Laurence Slate M, PA-C      . vitamin B-12 (CYANOCOBALAMIN) tablet 1,000 mcg  1,000 mcg Oral QPM Laurence Slate M, PA-C   1,000 mcg at 04/30/19 1743     Discharge Medications: Please see discharge summary for a list of discharge medications.  Relevant Imaging Results:  Relevant Lab Results:   Additional Information SSN: [id number]   HD patient M,W,F  Thurmond Butts, LCSWA

## 2019-05-02 ENCOUNTER — Inpatient Hospital Stay (HOSPITAL_COMMUNITY): Payer: Medicare Other

## 2019-05-02 MED ORDER — PROCHLORPERAZINE EDISYLATE 10 MG/2ML IJ SOLN
10.0000 mg | Freq: Four times a day (QID) | INTRAMUSCULAR | Status: DC | PRN
  Filled 2019-05-02: qty 2

## 2019-05-02 NOTE — Progress Notes (Signed)
  [facility] Progress Note   Assessment/ Plan:   Dialysis Orders:  MWF at [facility] 4hr, 400/800, EDW 76.5kg, 2K/2Ca, UFP #4, AVF, heparin 2400 - Parsabiv 5mg  IV q HD - Calcitriol 1.73mcg PO q HD - Mircera 68mcg IV q 2 weeks (last given 86mcg on 1/20, Hgb 10.3 on 2/10)  Assessment/Plan: 1.  PAD with gangrenous changes -> R TMA on 2/17 with Dr. Donzetta Matters. 2.  ESRD: Continue HD per usual MWF sched - s/p HD 2/17, 2/19.  Midodrine 10 mg pre-HD, plan for next HD 2/22 3.  Hypertension/volume: BP up slightly - keep outpt EDW for now. 4.  Anemia: Hgb 10.3 - s/p mircera, 8.3 ts/p aranesp 2/19 5.  Metabolic bone disease: Ca ok, velphoro as binder 6.  Hx TAVR 7.  CAD/HFrEF  Subjective:    Good appetite but in pain right now.    Objective:   BP (!) 124/51 (BP Location: Left Arm)   Pulse (!) 52   Temp 98.1 F (36.7 C) (Oral)   Resp 18   Ht 5\' 10"  (1.778 m)   Wt 77.4 kg   SpO2 99%   BMI 24.48 kg/m   Physical Exam: Gen: alert, appears uncomfortable CVS: RRR Resp: clear anteriorly, no c/w/r Abd: soft Ext: RLE in sterile dressing ACCESS: R RC AVF + T/B  Labs: BMET Recent Labs  Lab 04/29/19 0713 04/30/19 0314 05/01/19 0222  NA 138 138 137  K 4.4 5.3* 5.1  CL 93* 96* 92*  CO2 22 26 26   GLUCOSE 96 123* 104*  BUN 49* 22 42*  CREATININE 9.57* 6.18* 8.04*  CALCIUM 8.4* 8.3* 8.4*   CBC Recent Labs  Lab 04/29/19 0713 04/30/19 0314 05/01/19 0222  WBC 10.0 15.5* 11.8*  HGB 10.3* 10.1* 8.3*  HCT 32.1* 32.2* 26.6*  MCV 105.6* 105.6* 106.4*  PLT 134* 134* 105*      Medications:    . amiodarone  200 mg Oral Daily  . atorvastatin  80 mg Oral Q supper  . carvedilol  3.125 mg Oral BID  . Chlorhexidine Gluconate Cloth  6 each Topical Q0600  . clopidogrel  75 mg Oral Q supper  . darbepoetin (ARANESP) injection - DIALYSIS  100 mcg Intravenous Q Fri-HD  . docusate sodium  100 mg Oral Daily  . heparin  5,000 Units Subcutaneous Q8H  . midodrine  10 mg Oral Q M,W,F-HD   . multivitamin  1 tablet Oral QHS  . pantoprazole  40 mg Oral Daily  . sodium chloride flush  3 mL Intravenous Q12H  . sucroferric oxyhydroxide  1,250 mg Oral TID WC  . vitamin B-12  1,000 mcg Oral QPM     Madelon Lips, MD 05/02/2019, 9:56 AM

## 2019-05-02 NOTE — TOC Initial Note (Signed)
Transition of Care [facility]) - Initial/Assessment Note    Patient Details  Name: Jose Garrett MRN: [id number] Date of Birth: Apr 19, 1950  Transition of Care [facility]) CM/SW Contact:    Jose Garrett [place], [place] Phone Number: 05/02/2019, 10:50 AM  Clinical Narrative:                 Pt is a 69 y.o. male admitted 04/29/19 with progressive gangrenous changes of toes and dorsum of foot. S/p R transmet amputation 2/17.  Therapy recommending SNF following his hospital stay and patient agrees with this plan.  Patient currently resides with his sister and law, however plans to return home to his own apartment following his rehab stay. Patient states that he has a wheelchair at home as well as a shower chair and walker. Patient previously followed by [facility]. Fl2 has been sent out-bed offers pending at this time.  Social work to continue to follow to assist with discharge planning to skilled care.  Jose Ong, LCSW Clinical Social Work [phone]  Expected Discharge Plan: [place] Barriers to Discharge: No Barriers Identified   Patient Goals and CMS Choice Patient states their goals for this hospitalization and ongoing recovery are:: "continue to get better and feel better"   Choice offered to / list presented to : Patient  Expected Discharge Plan and Services Expected Discharge Plan: [place] In-house Referral: Clinical Social Work   Post Acute Care Choice: [place] Living arrangements for the past 2 months: Apartment                           HH Arranged: PT, OT          Prior Living Arrangements/Services Living arrangements for the past 2 months: Apartment Lives with:: Self(been staying with a family friend until. Will return to own home following rehab) Patient language and need for interpreter reviewed:: Yes Do you feel safe going back to the place where you live?: Yes      Need for Family Participation in  Patient Care: Yes (Comment) Care giver support system in place?: Yes (comment) Current home services: DME(wheelchair, shower chiar) Criminal Activity/Legal Involvement Pertinent to Current Situation/Hospitalization: No - Comment as needed  Activities of Daily Living Home Assistive Devices/Equipment: Eyeglasses, Environmental consultant (specify type), Wheelchair ADL Screening (condition at time of admission) Patient's cognitive ability adequate to safely complete daily activities?: Yes Is the patient deaf or have difficulty hearing?: No Does the patient have difficulty seeing, even when wearing glasses/contacts?: Yes Does the patient have difficulty concentrating, remembering, or making decisions?: No Patient able to express need for assistance with ADLs?: Yes Does the patient have difficulty dressing or bathing?: No Independently performs ADLs?: Yes (appropriate for developmental age) Does the patient have difficulty walking or climbing stairs?: Yes Weakness of Legs: Both Weakness of Arms/Hands: None  Permission Sought/Granted            Permission granted to share info w Relationship: Jose Garrett-daughter  Permission granted to share info w Contact Information: [phone]  Emotional Assessment Appearance:: Appears older than stated age Attitude/Demeanor/Rapport: Engaged Affect (typically observed): Accepting Orientation: : Oriented to Situation, Oriented to  Time, Oriented to Place, Oriented to Self Alcohol / Substance Use: Not Applicable Psych Involvement: No (comment)  Admission diagnosis:  PAD (peripheral artery disease) ([place]) [I73.9] Patient Active Problem List   Diagnosis Date Noted  . Polymorphic ventricular tachycardia ([place]) 10/24/2018  . S/P TAVR (transcatheter aortic valve  replacement) 10/21/2018  . Anxiety   . Coronary artery disease involving native coronary artery of native heart without angina pectoris   . Thrombocytopenia ([place])   . PAD (peripheral artery disease) ([place])  07/22/2018  . Acute on chronic combined systolic and diastolic CHF (congestive heart failure) ([place]) 11/30/2017  . ESRD (end stage renal disease) on dialysis ([place])   . Iron deficiency anemia 11/22/2015  . AL amyloidosis ([place]) 09/25/2014  . Anemia of chronic disease 09/25/2014  . Essential hypertension, benign 11/06/2013   PCP:  [name], MD Pharmacy:   [place], [place] - [place] [address] Phone: [phone] Fax: [facility], [address] [place] [address] Phone: [phone] Fax: [phone]  [facility] - [place], [place] - [address] [place], [place] [address] Phone: [phone] Fax: [phone]     Social Determinants of Health (SDOH) Interventions    Readmission Risk Interventions Readmission Risk Prevention Plan 04/15/2019 10/23/2018 08/20/2018  Transportation Screening Complete Complete -  PCP or Specialist Appt within 3-5 Days - Complete -  HRI or [place] - Complete -  Social Work Consult for [place] Planning/Counseling - Complete -  Palliative Care Screening - Not Applicable -  Medication Review Press photographer) Complete Complete -  PCP or Specialist appointment within 3-5 days of discharge Complete - Complete  HRI or Home Care Consult Complete - Complete  SW Recovery Care/Counseling Consult Complete - Complete  Palliative Care Screening Not Applicable - Not [place] Patient Refused - Not Applicable  Some recent data might be hidden

## 2019-05-02 NOTE — Progress Notes (Signed)
   VASCULAR SURGERY ASSESSMENT & PLAN:   POD 3 RIGHT TMA: The right transmetatarsal amputation site is healing well.  He has good Doppler signals in the right foot.  Awaiting skilled nursing facility.  Continue ambulation with Darco shoe.   SUBJECTIVE:   No complaints this morning.  PHYSICAL EXAM:   Vitals:   05/01/19 1830 05/01/19 1937 05/02/19 0505 05/02/19 0744  BP: 123/71 (!) 128/54 (!) 109/45 (!) 124/51  Pulse: 80 82 75 (!) 52  Resp:  16 16 18   Temp: 98.3 F (36.8 C) 98.6 F (37 C) 99.1 F (37.3 C) 98.1 F (36.7 C)  TempSrc: Oral Oral Oral Oral  SpO2: 100% 100% 99%   Weight:      Height:       His right transmetatarsal amputation site looks fine. He has good Doppler signals in the right foot.  LABS:   No new labs  PROBLEM LIST:    Active Problems:   PAD (peripheral artery disease) (HCC)   CURRENT MEDS:   . amiodarone  200 mg Oral Daily  . atorvastatin  80 mg Oral Q supper  . carvedilol  3.125 mg Oral BID  . Chlorhexidine Gluconate Cloth  6 each Topical Q0600  . clopidogrel  75 mg Oral Q supper  . darbepoetin (ARANESP) injection - DIALYSIS  100 mcg Intravenous Q Fri-HD  . docusate sodium  100 mg Oral Daily  . heparin  5,000 Units Subcutaneous Q8H  . midodrine  10 mg Oral Q M,W,F-HD  . multivitamin  1 tablet Oral QHS  . pantoprazole  40 mg Oral Daily  . sodium chloride flush  3 mL Intravenous Q12H  . sucroferric oxyhydroxide  1,250 mg Oral TID WC  . vitamin B-12  1,000 mcg Oral QPM    Deitra Mayo Office: [phone] 05/02/2019

## 2019-05-03 LAB — TYPE AND SCREEN
ABO/RH(D): O POS
Antibody Screen: POSITIVE
Donor AG Type: NEGATIVE
Donor AG Type: NEGATIVE
PT AG Type: NEGATIVE
Unit division: 0
Unit division: 0

## 2019-05-03 LAB — BPAM RBC
Blood Product Expiration Date: 202103112359
Blood Product Expiration Date: 202103142359
ISSUE DATE / TIME: 202102090820
Unit Type and Rh: 5100
Unit Type and Rh: 5100

## 2019-05-03 NOTE — Progress Notes (Signed)
   VASCULAR SURGERY ASSESSMENT & PLAN:   POD 4 RIGHT TMA: The right transmetatarsal amputation site is healing well.  He has good Doppler signals in the right foot.  Awaiting skilled nursing facility.  Continue ambulation with Darco shoe.  Had nausea yesterday but this has resolved.   His abdominal x-ray was unremarkable. Now he complains of some diarrhea.  I was considering checking a C. difficile however this apparently is not recommended unless he has had 3 watery stools within 24 hours.   SUBJECTIVE:   No complaints except for diarrhea.  PHYSICAL EXAM:   Vitals:   05/02/19 1632 05/02/19 1718 05/02/19 1947 05/03/19 0533  BP: (!) 128/45 (!) 150/46 (!) 135/54 (!) 130/44  Pulse: (!) 48 87 93 72  Resp:    20  Temp: 98.7 F (37.1 C) 98.6 F (37 C) 98.7 F (37.1 C) 98.9 F (37.2 C)  TempSrc: Oral Oral Oral Oral  SpO2: 91% 92% 92% 99%  Weight:      Height:       He has a brisk anterior tibial and posterior tibial signal with the Doppler on the right foot. (AT > PT) The right transmetatarsal amputation site was inspected and is healing adequately. His incisions look fine.  LABS:   ABDOMINAL X-RAY: His abdominal x-ray is unremarkable.  There is no bowel distention and no evidence of bowel obstruction.  PROBLEM LIST:    Active Problems:   PAD (peripheral artery disease) (HCC)   CURRENT MEDS:   . amiodarone  200 mg Oral Daily  . atorvastatin  80 mg Oral Q supper  . carvedilol  3.125 mg Oral BID  . Chlorhexidine Gluconate Cloth  6 each Topical Q0600  . clopidogrel  75 mg Oral Q supper  . darbepoetin (ARANESP) injection - DIALYSIS  100 mcg Intravenous Q Fri-HD  . docusate sodium  100 mg Oral Daily  . heparin  5,000 Units Subcutaneous Q8H  . midodrine  10 mg Oral Q M,W,F-HD  . multivitamin  1 tablet Oral QHS  . pantoprazole  40 mg Oral Daily  . sodium chloride flush  3 mL Intravenous Q12H  . sucroferric oxyhydroxide  1,250 mg Oral TID WC  . vitamin B-12  1,000 mcg  Oral QPM    Deitra Mayo Office: [phone] 05/03/2019

## 2019-05-03 NOTE — TOC Progression Note (Cosign Needed)
Transition of Care [facility]) - Progression Note    Patient Details  Name: Jose Garrett MRN: [id number] Date of Birth: 12/17/1950  Transition of Care [facility]) CM/SW [place], [place] Work Phone Number: 05/03/2019, 12:52 PM  Clinical Narrative:    MSW Intern provided patient with a medicare list of choices for SNF placement. Pt was engaged and interested in choices. SW will continue to follow.   Expected Discharge Plan: Skilled Nursing Facility Barriers to Discharge: No Barriers Identified  Expected Discharge Plan and Services Expected Discharge Plan: [place] In-house Referral: Clinical Social Work   Post Acute Care Choice: [place] Living arrangements for the past 2 months: Apartment                           HH Arranged: PT, OT           Social Determinants of Health (SDOH) Interventions    Readmission Risk Interventions Readmission Risk Prevention Plan 04/15/2019 10/23/2018 08/20/2018  Transportation Screening Complete Complete -  PCP or Specialist Appt within 3-5 Days - Complete -  HRI or [place] - Complete -  Social Work Consult for [place] Planning/Counseling - Complete -  Palliative Care Screening - Not Applicable -  Medication Review Press photographer) Complete Complete -  PCP or Specialist appointment within 3-5 days of discharge Complete - Complete  HRI or Home Care Consult Complete - Complete  SW Recovery Care/Counseling Consult Complete - Complete  Palliative Care Screening Not Applicable - Not [place] Patient Refused - Not Applicable  Some recent data might be hidden

## 2019-05-03 NOTE — Progress Notes (Signed)
  [facility] Progress Note   Assessment/ Plan:   Dialysis Orders:  MWF at [facility] 4hr, 400/800, EDW 76.5kg, 2K/2Ca, UFP #4, AVF, heparin 2400 - Parsabiv 5mg  IV q HD - Calcitriol 1.65mcg PO q HD - Mircera 31mcg IV q 2 weeks (last given 53mcg on 1/20, Hgb 10.3 on 2/10)  Assessment/Plan: 1.  PAD with gangrenous changes -> R TMA on 2/17 with Dr. Donzetta Matters.  Searching for SNF.   2.  ESRD: Continue HD per usual MWF sched - s/p HD 2/17, 2/19.  Midodrine 10 mg pre-HD, plan for next HD 2/22 3.  Hypertension/volume: BP up slightly - keep outpt EDW for now. 4.  Anemia: Hgb 10.3 - s/p mircera, 8.3 s/p aranesp 2/19 5.  Metabolic bone disease: Ca ok, velphoro as binder 6.  Hx TAVR 7.  CAD/HFrEF 8. Dispo: searching for SNF  Subjective:    Had some n/v overnight, AXR negative.  [birth year] his birthday!  Eating breakfast  Objective:   BP (!) 112/44 (BP Location: Left Arm)   Pulse 74   Temp 98.9 F (37.2 C) (Oral)   Resp 20   Ht 5\' 10"  (1.778 m)   Wt 77.4 kg   SpO2 97%   BMI 24.48 kg/m   Physical Exam: Gen: alert, sitting in bed, eating breakfast CVS: RRR Resp: clear anteriorly, no c/w/r Abd: soft Ext: RLE in sterile dressing ACCESS: R RC AVF + T/B  Labs: BMET Recent Labs  Lab 04/29/19 0713 04/30/19 0314 05/01/19 0222  NA 138 138 137  K 4.4 5.3* 5.1  CL 93* 96* 92*  CO2 22 26 26   GLUCOSE 96 123* 104*  BUN 49* 22 42*  CREATININE 9.57* 6.18* 8.04*  CALCIUM 8.4* 8.3* 8.4*   CBC Recent Labs  Lab 04/29/19 0713 04/30/19 0314 05/01/19 0222  WBC 10.0 15.5* 11.8*  HGB 10.3* 10.1* 8.3*  HCT 32.1* 32.2* 26.6*  MCV 105.6* 105.6* 106.4*  PLT 134* 134* 105*      Medications:    . amiodarone  200 mg Oral Daily  . atorvastatin  80 mg Oral Q supper  . carvedilol  3.125 mg Oral BID  . Chlorhexidine Gluconate Cloth  6 each Topical Q0600  . clopidogrel  75 mg Oral Q supper  . darbepoetin (ARANESP) injection - DIALYSIS  100 mcg Intravenous Q Fri-HD  . docusate sodium   100 mg Oral Daily  . heparin  5,000 Units Subcutaneous Q8H  . midodrine  10 mg Oral Q M,W,F-HD  . multivitamin  1 tablet Oral QHS  . pantoprazole  40 mg Oral Daily  . sodium chloride flush  3 mL Intravenous Q12H  . sucroferric oxyhydroxide  1,250 mg Oral TID WC  . vitamin B-12  1,000 mcg Oral QPM     Madelon Lips, MD 05/03/2019, 10:01 AM

## 2019-05-03 NOTE — Progress Notes (Signed)
Plan of care reviewed, Pt is progressing. Pt appeared to be confused and forgetful, disoriented to time and place after came back from x-ray. Vital signs stable, remained afebrile.   Pain tolerated well. No nausea, no vomiting. Abdominal x-ray result was negative for bowel obstruction. Per day shift, Jessica,RN reported Pt had BM x 1 on day shift. Denied abdominal pain and tenderness.   Right foot dressing dry and clean, no drainage. Doppler detected pulse signals from right foot and left foot.  No acute distress noted.  Will continue to monitor.  Kennyth Lose, RN

## 2019-05-04 ENCOUNTER — Ambulatory Visit: Payer: Medicare Other | Admitting: Family Medicine

## 2019-05-04 LAB — SARS CORONAVIRUS 2 (TAT 6-24 HRS): SARS Coronavirus 2: NEGATIVE

## 2019-05-04 MED ORDER — OXYCODONE HCL 5 MG PO TABS: 5.0000 mg | ORAL_TABLET | Freq: Four times a day (QID) | ORAL | 0 refills | Status: DC | PRN

## 2019-05-04 NOTE — TOC Progression Note (Signed)
Transition of Care [facility]) - Progression Note    Patient Details  Name: Jose Garrett MRN: [id number] Date of Birth: Aug 21, 1950  Transition of Care [facility]) CM/SW [place], [place] Phone Number: 05/04/2019, 1:06 PM  Clinical Narrative:     CSW spoke with the patient via phone. CSW informed patient of bed offers. Patient states he was not feeling well and was not able to make a sound decision on SNF placement. Patient requested CSW contact his daughter,Erin.  CSW called patient's daughter and informed her of bed offers. She will review and call CSW back today with choice.   RN updated and covid requested if the patient is anticipates to discharge within 48 hrs.   Thurmond Butts, MSW, [place] Clinical Social Worker   Expected Discharge Plan: Skilled Nursing Facility Barriers to Discharge: No Barriers Identified  Expected Discharge Plan and Services Expected Discharge Plan: [place] In-house Referral: Clinical Social Work   Post Acute Care Choice: [place] Living arrangements for the past 2 months: Apartment                           HH Arranged: PT, OT           Social Determinants of Health (SDOH) Interventions    Readmission Risk Interventions Readmission Risk Prevention Plan 04/15/2019 10/23/2018 08/20/2018  Transportation Screening Complete Complete -  PCP or Specialist Appt within 3-5 Days - Complete -  HRI or [place] - Complete -  Social Work Consult for [place] Planning/Counseling - Complete -  Palliative Care Screening - Not Applicable -  Medication Review Press photographer) Complete Complete -  PCP or Specialist appointment within 3-5 days of discharge Complete - Complete  HRI or Home Care Consult Complete - Complete  SW Recovery Care/Counseling Consult Complete - Complete  Palliative Care Screening Not Applicable - Not [place] Patient Refused - Not Applicable  Some  recent data might be hidden

## 2019-05-04 NOTE — Progress Notes (Addendum)
Progress Note    05/04/2019 7:29 AM 5 Days Post-Op  Subjective:  Feeling better this morning. Denies any nausea. Tolerating diet. Diarrhea improved- says still some loose stool but no longer watery. Pain well managed last two days   Vitals:   05/03/19 2311 05/04/19 0526  BP: (!) 124/59 (!) 112/52  Pulse:  75  Resp:    Temp:  98.5 F (36.9 C)  SpO2:  96%   Physical Exam: General: Well appearing, no in any acute distress Lungs: non labored Incisions:  Right TMA site intact, some necrosis on the lateral suture margin, no drainage on compression, erythema of dorsum improved. Dry eschar of right heel. Bloody drainage was present on dressings. New dressings were applied. Right lower extremity incisions, clean, dry intact Extremities:  Right femoral pulse 2+, Doppler AT and PT signals Neurologic: alert and oriented  CBC    Component Value Date/Time   WBC 11.8 (H) 05/01/2019 0222   RBC 2.50 (L) 05/01/2019 0222   HGB 8.3 (L) 05/01/2019 0222   HGB 11.4 (L) 11/12/2018 1637   HGB 10.8 (L) 03/14/2017 0921   HCT 26.6 (L) 05/01/2019 0222   HCT 35.0 (L) 11/12/2018 1637   HCT 32.5 (L) 03/14/2017 0921   PLT 105 (L) 05/01/2019 0222   PLT 51 (LL) 11/12/2018 1637   MCV 106.4 (H) 05/01/2019 0222   MCV 103 (H) 11/12/2018 1637   MCV 106.2 (H) 03/14/2017 0921   MCH 33.2 05/01/2019 0222   MCHC 31.2 05/01/2019 0222   RDW 14.0 05/01/2019 0222   RDW 14.6 11/12/2018 1637   RDW 14.6 03/14/2017 0921   LYMPHSABS 1.0 11/12/2018 1637   LYMPHSABS 0.8 (L) 03/14/2017 0921   MONOABS 0.4 10/20/2018 0638   MONOABS 0.6 03/14/2017 0921   EOSABS 0.2 11/12/2018 1637   BASOSABS 0.0 11/12/2018 1637   BASOSABS 0.0 03/14/2017 0921    BMET    Component Value Date/Time   NA 137 05/01/2019 0222   NA 135 07/14/2018 0909   NA 141 03/14/2017 0921   K 5.1 05/01/2019 0222   K 4.1 03/14/2017 0921   CL 92 (L) 05/01/2019 0222   CO2 26 05/01/2019 0222   CO2 33 (H) 03/14/2017 0921   GLUCOSE 104 (H) 05/01/2019  0222   GLUCOSE 144 (H) 03/14/2017 0921   BUN 42 (H) 05/01/2019 0222   BUN 76 (HH) 07/14/2018 0909   BUN 30.1 (H) 03/14/2017 0921   CREATININE 8.04 (H) 05/01/2019 0222   CREATININE 9.16 (HH) 09/17/2017 0954   CREATININE 8.1 (HH) 03/14/2017 0921   CALCIUM 8.4 (L) 05/01/2019 0222   CALCIUM 9.5 03/14/2017 0921   GFRNONAA 6 (L) 05/01/2019 0222   GFRNONAA 5 (L) 09/17/2017 0954   GFRNONAA 24 (L) 06/30/2014 1528   GFRAA 7 (L) 05/01/2019 0222   GFRAA 6 (L) 09/17/2017 0954   GFRAA 28 (L) 06/30/2014 1528    INR    Component Value Date/Time   INR 1.2 04/29/2019 0713   INR 1.10 (L) 01/21/2015 0809     Intake/Output Summary (Last 24 hours) at 05/04/2019 0729 Last data filed at 05/04/2019 0600 Gross per 24 hour  Intake 610 ml  Output --  Net 610 ml     Assessment/Plan:  69 y.o. male is s/p right transmetatarsal amputation 5 Days Post-Op. Good Doppler signals right foot. Stump healing well. Nausea resolved and diarrhea improved so do not need to order C. Diff at this point.  Continue ambulation with darco shoe with PT. Float heel while in  bed. Will have Hemodialysis today. Still awaiting SNF bed but hopeful discharge today   Marval Regal [facility] [phone] 05/04/2019 7:29 AM   I have independently interviewed and examined patient in hemodialysis and agree with PA assessment and plan above.   Radley Barto C. Donzetta Matters, MD [facility] [place] Office: [phone] Pager: [phone]

## 2019-05-04 NOTE — Progress Notes (Signed)
Physical Therapy Treatment Patient Details Name: Jose Garrett MRN: [id number] DOB: [birth year] Today's Date: 05/04/2019    History of Present Illness Pt is a 69 y.o. male with recent hx of R LE bypass (04/10/19), now admitted 04/29/19 with progressive gangrenous changes of toes and dorsum of foot. S/p R transmet amputation 2/17. PMH includes aortic stenosis, HTN, HLD, CHF, CAD, PD, anxiety, ESRD (HD MWF).     PT Comments    Patient seen for mobility progression. Pt in bed and agreeable to participate in therapy although states he doesn't feel well today and that dialysis did not go well. Pt limited by c/o dizziness in standing and once sitting back EOB declined attempting further mobility. BP in general comments below. Pt encouraged to increased OOB mobility throughout the day with nursing staff assistance. Continue to progress as tolerated with anticipated d/c to SNF for further skilled PT services.     Follow Up Recommendations  SNF;Supervision/Assistance - 24 hour     Equipment Recommendations  None recommended by PT    Recommendations for Other Services       Precautions / Restrictions Precautions Precautions: Fall Required Braces or Orthoses: Other Brace Other Brace: darco shoe R  Restrictions Weight Bearing Restrictions: Yes RLE Weight Bearing: Partial weight bearing RLE Partial Weight Bearing Percentage or Pounds: wieght bearing through heel with darco shoe    Mobility  Bed Mobility Overal bed mobility: Needs Assistance Bed Mobility: Supine to Sit;Sit to Supine     Supine to sit: Modified independent (Device/Increase time) Sit to supine: Supervision   General bed mobility comments: increasd time and effort, use of rail, and HOB elevated  Transfers Overall transfer level: Needs assistance Equipment used: Rolling walker (2 wheeled) Transfers: Sit to/from Stand Sit to Stand: Min assist;+2 physical assistance         General transfer comment: cues for safe hand  placement  Ambulation/Gait             General Gait Details: pt with dizziness upon standing and declined attempting to ambulate   Stairs             Wheelchair Mobility    Modified Rankin (Stroke Patients Only)       Balance Overall balance assessment: Needs assistance Sitting-balance support: No upper extremity supported;Feet supported Sitting balance-Leahy Scale: Good     Standing balance support: Bilateral upper extremity supported;During functional activity Standing balance-Leahy Scale: Poor                              Cognition Arousal/Alertness: Awake/alert(drowsy) Behavior During Therapy: WFL for tasks assessed/performed Overall Cognitive Status: Within Functional Limits for tasks assessed                                        Exercises      General Comments General comments (skin integrity, edema, etc.): after standing BP taken sitting EOB 107/51 (64) and then in supine end of session 117/48       Pertinent Vitals/Pain Pain Assessment: Faces Faces Pain Scale: Hurts little more Pain Location: RLE Pain Descriptors / Indicators: Sore;Grimacing;Guarding Pain Intervention(s): Limited activity within patient's tolerance;Monitored during session;Repositioned    Home Living                      Prior Function  PT Goals (current goals can now be found in the care plan section) Progress towards PT goals: Not progressing toward goals - comment    Frequency    Min 3X/week      PT Plan Current plan remains appropriate    Co-evaluation              AM-PAC PT "6 Clicks" Mobility   Outcome Measure  Help needed turning from your back to your side while in a flat bed without using bedrails?: None Help needed moving from lying on your back to sitting on the side of a flat bed without using bedrails?: A Little Help needed moving to and from a bed to a chair (including a wheelchair)?: A  Little Help needed standing up from a chair using your arms (e.g., wheelchair or bedside chair)?: A Little Help needed to walk in hospital room?: A Little Help needed climbing 3-5 steps with a railing? : A Lot 6 Click Score: 18    End of Session Equipment Utilized During Treatment: Gait belt Activity Tolerance: Patient tolerated treatment well Patient left: with call bell/phone within reach;in bed Nurse Communication: Mobility status PT Visit Diagnosis: Other abnormalities of gait and mobility (R26.89) Pain - Right/Left: Right Pain - part of body: Leg     Time: 2800-3491 PT Time Calculation (min) (ACUTE ONLY): 23 min  Charges:  $Gait Training: 8-22 mins $Therapeutic Activity: 8-22 mins                     Earney Navy, PTA Acute Rehabilitation Services Pager: [phone] Office: [phone]     Darliss Cheney 05/04/2019, 3:15 PM

## 2019-05-04 NOTE — Progress Notes (Signed)
OT Cancellation Note  Patient Details Name: Jose Garrett MRN: [id number] DOB: March 29, 1950   Cancelled Treatment:     Patient off floor at dialysis during AM, when OT checked in ~1400 patient in middle of PT session. Will re-attempt as schedule permits.   [place] OT office: [place] 05/04/2019, 2:00 PM

## 2019-05-04 NOTE — TOC Progression Note (Signed)
Transition of Care [facility]) - Progression Note    Patient Details  Name: Jose Garrett MRN: [id number] Date of Birth: November 04, 1950  Transition of Care [facility]) CM/SW [place], [place] Phone Number: 05/04/2019, 5:03 PM  Clinical Narrative:     Patient's daughter,Erin has accepted bed offer with [facility].   CSW has contacted [facility] and they have confirmed offer.   CSW Renal Navigator/Colleen has been updated on placement.  CSW will continue to follow and assist with discharge planning.  Thurmond Butts, MSW, [place] Clinical Social Worker   Expected Discharge Plan: Skilled Nursing Facility Barriers to Discharge: No Barriers Identified  Expected Discharge Plan and Services Expected Discharge Plan: [place] In-house Referral: Clinical Social Work   Post Acute Care Choice: [place] Living arrangements for the past 2 months: Apartment                           HH Arranged: PT, OT           Social Determinants of Health (SDOH) Interventions    Readmission Risk Interventions Readmission Risk Prevention Plan 04/15/2019 10/23/2018 08/20/2018  Transportation Screening Complete Complete -  PCP or Specialist Appt within 3-5 Days - Complete -  HRI or [place] - Complete -  Social Work Consult for [place] Planning/Counseling - Complete -  Palliative Care Screening - Not Applicable -  Medication Review Press photographer) Complete Complete -  PCP or Specialist appointment within 3-5 days of discharge Complete - Complete  HRI or Home Care Consult Complete - Complete  SW Recovery Care/Counseling Consult Complete - Complete  Palliative Care Screening Not Applicable - Not [place] Patient Refused - Not Applicable  Some recent data might be hidden

## 2019-05-04 NOTE — Progress Notes (Signed)
[facility] Progress Note   Subjective:  Seen on HD - 3L UFG and tolerating. No CP/dyspnea. Pt reviewing SNF list.  Objective Vitals:   05/03/19 1706 05/03/19 2002 05/03/19 2311 05/04/19 0526  BP: (!) 141/46 (!) 128/56 (!) 124/59 (!) 112/52  Pulse: 67 70  75  Resp:      Temp:  98.5 F (36.9 C)  98.5 F (36.9 C)  TempSrc:  Oral  Oral  SpO2: 95% 97%  96%  Weight:      Height:       Physical Exam General: Well appearing, NAD Heart: RRR; no murmur Lungs: CTA anteriorly Abdomen: soft Extremities: R foot bandaged with trace RLE edema; no LLE edema Dialysis Access: R forearm AVF + thrill  Additional Objective Labs: Basic Metabolic Panel: Recent Labs  Lab 04/29/19 0713 04/30/19 0314 05/01/19 0222  NA 138 138 137  K 4.4 5.3* 5.1  CL 93* 96* 92*  CO2 22 26 26   GLUCOSE 96 123* 104*  BUN 49* 22 42*  CREATININE 9.57* 6.18* 8.04*  CALCIUM 8.4* 8.3* 8.4*   Liver Function Tests: Recent Labs  Lab 04/29/19 0713  AST 17  ALT 7  ALKPHOS 63  BILITOT 1.3*  PROT 6.7  ALBUMIN 3.1*   CBC: Recent Labs  Lab 04/29/19 0713 04/30/19 0314 05/01/19 0222  WBC 10.0 15.5* 11.8*  HGB 10.3* 10.1* 8.3*  HCT 32.1* 32.2* 26.6*  MCV 105.6* 105.6* 106.4*  PLT 134* 134* 105*   Studies/Results: DG Abd 2 Views  Result Date: 05/02/2019 CLINICAL DATA:  Nausea and vomiting. EXAM: ABDOMEN - 2 VIEW COMPARISON:  None. FINDINGS: No free intra-abdominal air. No bowel dilatation to suggest obstruction. Air within nondistended transverse colon. Small volume of colonic stool. Surgical clips in the right upper quadrant from cholecystectomy. Soft tissue calcification in the left pelvis is likely a phlebolith. There are vascular calcifications. Postsurgical change in the right groin. No acute osseous abnormalities are seen. IMPRESSION: Normal bowel gas pattern. No free air. Electronically Signed   By: Keith Rake M.D.   On: 05/02/2019 19:42   Medications: . sodium chloride    .  sodium chloride    . sodium chloride    . magnesium sulfate bolus IVPB     . amiodarone  200 mg Oral Daily  . atorvastatin  80 mg Oral Q supper  . carvedilol  3.125 mg Oral BID  . Chlorhexidine Gluconate Cloth  6 each Topical Q0600  . clopidogrel  75 mg Oral Q supper  . darbepoetin (ARANESP) injection - DIALYSIS  100 mcg Intravenous Q Fri-HD  . docusate sodium  100 mg Oral Daily  . heparin  5,000 Units Subcutaneous Q8H  . midodrine  10 mg Oral Q M,W,F-HD  . multivitamin  1 tablet Oral QHS  . pantoprazole  40 mg Oral Daily  . sodium chloride flush  3 mL Intravenous Q12H  . sucroferric oxyhydroxide  1,250 mg Oral TID WC  . vitamin B-12  1,000 mcg Oral QPM    Dialysis Orders: MWF at [facility] 4hr, 400/800, EDW 76.5kg, 2K/2Ca, UFP #4, AVF, heparin 2400 - Parsabiv 5mg  IV q HD - Calcitriol 1.41mcg PO q HD - Mircera 62mcg IV q 2 weeks (last given 60mcg on 1/20, Hgb 10.3 on 2/10)  Assessment/Plan: 1. PAD with gangrenous changes: S/p R TMA on 2/17 with Dr. Donzetta Matters.  Searching for SNF.   2. ESRD:Continue HD per usual MWF sched - HD today. Midodrine 10 mg pre-HD. 3. Hypertension/volume:BP up slightly,  may be able to lower EDW slightly. 4. Anemia:Hgb 8.3 - Aranesp q Friday. 5. Metabolic bone disease:Ca ok, Phos pending. Continue velphoro as binder 6. Hx TAVR 7. CAD/HFrEF 8.  Dispo: SNF placement pending.  Veneta Penton, PA-C 05/04/2019, 8:41 AM  [facility] Pager: [phone]

## 2019-05-04 NOTE — Progress Notes (Addendum)
Paged PA Corrine concerning patient complaints of "not feeling well". Patient BP low during HD and received 100 ml bolus and concerned about his bowels. Patient states someone spoke to him when he came back from HD about going to SNF. Patient upset that we are "putting on the gas" to get him out of here. States one of the facilities is a 2 star and the other his sister was in for 20 years. I explained that we generally started working on discharge plans when patient was admitted. I explained that his concerns would be relayed to MD.  I am only aware of one BM today and that was from HD. Pt resting with call bell within reach.  Will continue to monitor.

## 2019-05-05 ENCOUNTER — Inpatient Hospital Stay (HOSPITAL_COMMUNITY): Payer: Medicare Other | Admitting: Certified Registered Nurse Anesthetist

## 2019-05-05 ENCOUNTER — Encounter (HOSPITAL_COMMUNITY)
Admission: AD | Disposition: A | Discharge status: Rehabilitation Facility | Payer: Self-pay | Source: Home / Self Care | Attending: Vascular Surgery

## 2019-05-05 DIAGNOSIS — I96 Gangrene, not elsewhere classified: Secondary | ICD-10-CM

## 2019-05-05 HISTORY — PX: I & D EXTREMITY: SHX5045

## 2019-05-05 LAB — CBC
HCT: 28.3 % — ABNORMAL LOW (ref 39.0–52.0)
Hemoglobin: 8.9 g/dL — ABNORMAL LOW (ref 13.0–17.0)
MCH: 33.1 pg (ref 26.0–34.0)
MCHC: 31.4 g/dL (ref 30.0–36.0)
MCV: 105.2 fL — ABNORMAL HIGH (ref 80.0–100.0)
Platelets: 145 10*3/uL — ABNORMAL LOW (ref 150–400)
RBC: 2.69 MIL/uL — ABNORMAL LOW (ref 4.22–5.81)
RDW: 13.8 % (ref 11.5–15.5)
WBC: 9.7 10*3/uL (ref 4.0–10.5)
nRBC: 0 % (ref 0.0–0.2)

## 2019-05-05 LAB — BASIC METABOLIC PANEL
Anion gap: 18 — ABNORMAL HIGH (ref 5–15)
BUN: 39 mg/dL — ABNORMAL HIGH (ref 8–23)
CO2: 25 mmol/L (ref 22–32)
Calcium: 8.7 mg/dL — ABNORMAL LOW (ref 8.9–10.3)
Chloride: 94 mmol/L — ABNORMAL LOW (ref 98–111)
Creatinine, Ser: 6.38 mg/dL — ABNORMAL HIGH (ref 0.61–1.24)
GFR calc Af Amer: 10 mL/min — ABNORMAL LOW (ref >60)
GFR calc non Af Amer: 8 mL/min — ABNORMAL LOW (ref >60)
Glucose, Bld: 105 mg/dL — ABNORMAL HIGH (ref 70–99)
Potassium: 4.2 mmol/L (ref 3.5–5.1)
Sodium: 137 mmol/L (ref 135–145)

## 2019-05-05 SURGERY — IRRIGATION AND DEBRIDEMENT EXTREMITY
Anesthesia: Monitor Anesthesia Care | Site: Foot | Laterality: Right

## 2019-05-05 MED ORDER — OXYCODONE HCL 5 MG PO TABS: 5.0000 mg | ORAL_TABLET | Freq: Once | ORAL | Status: DC | PRN

## 2019-05-05 MED ORDER — ONDANSETRON HCL 4 MG/2ML IJ SOLN
INTRAMUSCULAR | Status: DC | PRN
  Administered 2019-05-05: 4 mg via INTRAVENOUS

## 2019-05-05 MED ORDER — 0.9 % SODIUM CHLORIDE (POUR BTL) OPTIME
TOPICAL | Status: DC | PRN
  Administered 2019-05-05: 1000 mL

## 2019-05-05 MED ORDER — ONDANSETRON HCL 4 MG/2ML IJ SOLN
INTRAMUSCULAR | Status: AC
  Filled 2019-05-05: qty 2

## 2019-05-05 MED ORDER — ONDANSETRON HCL 4 MG/2ML IJ SOLN: 4.0000 mg | Freq: Once | INTRAMUSCULAR | Status: DC | PRN

## 2019-05-05 MED ORDER — FENTANYL CITRATE (PF) 250 MCG/5ML IJ SOLN
INTRAMUSCULAR | Status: AC
  Filled 2019-05-05: qty 5

## 2019-05-05 MED ORDER — FENTANYL CITRATE (PF) 100 MCG/2ML IJ SOLN: 25.0000 ug | INTRAMUSCULAR | Status: DC | PRN

## 2019-05-05 MED ORDER — MIDAZOLAM HCL 2 MG/2ML IJ SOLN
INTRAMUSCULAR | Status: AC
  Filled 2019-05-05: qty 2

## 2019-05-05 MED ORDER — CEFAZOLIN SODIUM-DEXTROSE 2-3 GM-%(50ML) IV SOLR
INTRAVENOUS | Status: DC | PRN
  Administered 2019-05-05: 2 g via INTRAVENOUS

## 2019-05-05 MED ORDER — OXYCODONE HCL 5 MG/5ML PO SOLN: 5.0000 mg | Freq: Once | ORAL | Status: DC | PRN

## 2019-05-05 MED ORDER — CEFAZOLIN SODIUM-DEXTROSE 2-4 GM/100ML-% IV SOLN
INTRAVENOUS | Status: AC
  Filled 2019-05-05: qty 100

## 2019-05-05 MED ORDER — SODIUM CHLORIDE 0.9 % IV SOLN: INTRAVENOUS | Status: DC

## 2019-05-05 MED ORDER — FENTANYL CITRATE (PF) 100 MCG/2ML IJ SOLN
INTRAMUSCULAR | Status: DC | PRN
  Administered 2019-05-05: 25 ug via INTRAVENOUS
  Administered 2019-05-05: 50 ug via INTRAVENOUS

## 2019-05-05 MED ORDER — SODIUM CHLORIDE 0.9 % IV SOLN: INTRAVENOUS | Status: DC | PRN

## 2019-05-05 MED ORDER — LIDOCAINE HCL (PF) 1 % IJ SOLN
INTRAMUSCULAR | Status: AC
  Filled 2019-05-05: qty 30

## 2019-05-05 MED ORDER — MIDAZOLAM HCL 5 MG/5ML IJ SOLN
INTRAMUSCULAR | Status: DC | PRN
  Administered 2019-05-05: 1 mg via INTRAVENOUS

## 2019-05-05 MED ORDER — PROPOFOL 10 MG/ML IV BOLUS
INTRAVENOUS | Status: AC
  Filled 2019-05-05: qty 20

## 2019-05-05 MED ORDER — PROPOFOL 10 MG/ML IV BOLUS
INTRAVENOUS | Status: DC | PRN
  Administered 2019-05-05: 20 mg via INTRAVENOUS

## 2019-05-05 MED ORDER — PHENYLEPHRINE HCL-NACL 10-0.9 MG/250ML-% IV SOLN
INTRAVENOUS | Status: DC | PRN
  Administered 2019-05-05: 50 ug/min via INTRAVENOUS

## 2019-05-05 MED ORDER — PROPOFOL 500 MG/50ML IV EMUL
INTRAVENOUS | Status: DC | PRN
  Administered 2019-05-05: 100 ug/kg/min via INTRAVENOUS

## 2019-05-05 MED ORDER — LIDOCAINE HCL 1 % IJ SOLN
INTRAMUSCULAR | Status: DC | PRN
  Administered 2019-05-05: 10 mL

## 2019-05-05 SURGICAL SUPPLY — 44 items
BNDG ELASTIC 4X5.8 VLCR STR LF (GAUZE/BANDAGES/DRESSINGS) ×2 IMPLANT
BNDG ELASTIC 6X5.8 VLCR STR LF (GAUZE/BANDAGES/DRESSINGS) IMPLANT
BNDG GAUZE ELAST 4 BULKY (GAUZE/BANDAGES/DRESSINGS) ×2 IMPLANT
CANISTER SUCT 3000ML PPV (MISCELLANEOUS) ×3 IMPLANT
COVER SURGICAL LIGHT HANDLE (MISCELLANEOUS) ×3 IMPLANT
COVER WAND RF STERILE (DRAPES) ×1 IMPLANT
DECANTER SPIKE VIAL GLASS SM (MISCELLANEOUS) ×2 IMPLANT
DRAPE EXTREMITY T 121X128X90 (DISPOSABLE) IMPLANT
DRAPE HALF SHEET 40X57 (DRAPES) IMPLANT
DRAPE INCISE IOBAN 66X45 STRL (DRAPES) IMPLANT
DRAPE ORTHO SPLIT 77X108 STRL (DRAPES)
DRAPE SURG ORHT 6 SPLT 77X108 (DRAPES) IMPLANT
DRSG ADAPTIC 3X8 NADH LF (GAUZE/BANDAGES/DRESSINGS) IMPLANT
ELECT REM PT RETURN 9FT ADLT (ELECTROSURGICAL) ×3
ELECTRODE REM PT RTRN 9FT ADLT (ELECTROSURGICAL) ×1 IMPLANT
GAUZE SPONGE 4X4 12PLY STRL LF (GAUZE/BANDAGES/DRESSINGS) ×3 IMPLANT
GLOVE BIO SURGEON STRL SZ7.5 (GLOVE) ×3 IMPLANT
GLOVE BIOGEL PI IND STRL 6.5 (GLOVE) IMPLANT
GLOVE BIOGEL PI INDICATOR 6.5 (GLOVE) ×4
GLOVE SKINSENSE NS SZ6.5 (GLOVE) ×2
GLOVE SKINSENSE NS SZ7.0 (GLOVE) ×2
GLOVE SKINSENSE STRL SZ6.5 (GLOVE) IMPLANT
GLOVE SKINSENSE STRL SZ7.0 (GLOVE) IMPLANT
GOWN STRL REUS W/ TWL LRG LVL3 (GOWN DISPOSABLE) ×2 IMPLANT
GOWN STRL REUS W/ TWL XL LVL3 (GOWN DISPOSABLE) ×1 IMPLANT
GOWN STRL REUS W/TWL LRG LVL3 (GOWN DISPOSABLE) ×6
GOWN STRL REUS W/TWL XL LVL3 (GOWN DISPOSABLE) ×3
KIT BASIN OR (CUSTOM PROCEDURE TRAY) ×3 IMPLANT
KIT TURNOVER KIT B (KITS) ×3 IMPLANT
NEEDLE 22X1 1/2 (OR ONLY) (NEEDLE) ×2 IMPLANT
NS IRRIG 1000ML POUR BTL (IV SOLUTION) ×3 IMPLANT
PACK GENERAL/GYN (CUSTOM PROCEDURE TRAY) IMPLANT
PAD ARMBOARD 7.5X6 YLW CONV (MISCELLANEOUS) ×6 IMPLANT
SUT ETHILON 2 0 PSLX (SUTURE) ×4 IMPLANT
SUT ETHILON 3 0 PS 1 (SUTURE) IMPLANT
SUT VIC AB 2-0 CT1 27 (SUTURE)
SUT VIC AB 2-0 CT1 TAPERPNT 27 (SUTURE) IMPLANT
SUT VIC AB 3-0 SH 27 (SUTURE)
SUT VIC AB 3-0 SH 27X BRD (SUTURE) IMPLANT
SUT VICRYL 4-0 PS2 18IN ABS (SUTURE) IMPLANT
SYR CONTROL 10ML LL (SYRINGE) ×2 IMPLANT
TOWEL GREEN STERILE (TOWEL DISPOSABLE) ×6 IMPLANT
TOWEL GREEN STERILE FF (TOWEL DISPOSABLE) ×3 IMPLANT
WATER STERILE IRR 1000ML POUR (IV SOLUTION) ×3 IMPLANT

## 2019-05-05 NOTE — Care Management Important Message (Signed)
Important Message  Patient Details  Name: YITZCHOK CARRIGER MRN: [id number] Date of Birth: [birth year]   Medicare Important Message Given:  Yes     [name] 05/05/2019, 9:22 AM

## 2019-05-05 NOTE — Anesthesia Procedure Notes (Signed)
Procedure Name: MAC Date/Time: 05/05/2019 4:03 PM Performed by: Inda Coke, CRNA Pre-anesthesia Checklist: Patient identified, Emergency Drugs available, Suction available, Timeout performed and Patient being monitored Patient Re-evaluated:Patient Re-evaluated prior to induction Oxygen Delivery Method: Simple face mask Induction Type: IV induction Dental Injury: Teeth and Oropharynx as per pre-operative assessment

## 2019-05-05 NOTE — Transfer of Care (Signed)
Immediate Anesthesia Transfer of Care Note  Patient: Jose Garrett  Procedure(s) Performed: Revision of  RIGHT  FOOT Amputation (Right Foot)  Patient Location: PACU  Anesthesia Type:MAC  Level of Consciousness: awake and alert   Airway & Oxygen Therapy: Patient Spontanous Breathing and Patient connected to nasal cannula oxygen  Post-op Assessment: Report given to RN and Post -op Vital signs reviewed and stable  Post vital signs: Reviewed and stable  Last Vitals:  Vitals Value Taken Time  BP 106/50 05/05/19 1706  Temp    Pulse 67 05/05/19 1707  Resp 18 05/05/19 1707  SpO2 99 % 05/05/19 1707  Vitals shown include unvalidated device data.  Last Pain:  Vitals:   05/05/19 1523  TempSrc: Oral  PainSc:       Patients Stated Pain Goal: 2 (86/77/37 3668)  Complications: No apparent anesthesia complications

## 2019-05-05 NOTE — Anesthesia Postprocedure Evaluation (Signed)
Anesthesia Post Note  Patient: Jose Garrett  Procedure(s) Performed: Revision of  RIGHT  FOOT Amputation (Right Foot)     Patient location during evaluation: PACU Anesthesia Type: MAC Level of consciousness: awake and alert Pain management: pain level controlled Vital Signs Assessment: post-procedure vital signs reviewed and stable Respiratory status: spontaneous breathing, nonlabored ventilation, respiratory function stable and patient connected to nasal cannula oxygen Cardiovascular status: stable and blood pressure returned to baseline Postop Assessment: no apparent nausea or vomiting Anesthetic complications: no    Last Vitals:  Vitals:   05/05/19 1742 05/05/19 1755  BP:  (!) 151/58  Pulse: 68 69  Resp: 18 20  Temp:  36.9 C  SpO2: 97% 100%    Last Pain:  Vitals:   05/05/19 1807  TempSrc:   PainSc: 8                  Catalina Gravel

## 2019-05-05 NOTE — Op Note (Signed)
    Patient name: Jose Garrett MRN: [id number] DOB: Jul 04, 1950 Sex: male  05/05/2019 Pre-operative Diagnosis: esrd, right tma gangrenous changes Post-operative diagnosis:  Same Surgeon:  Eda Paschal. Donzetta Matters, MD Procedure Performed:  Revision of right transmetatarsal amputation with removal of necrotic tissue and partial closure  Indications: 69 year old male has healed amputations on his left foot has a transmetatarsal amputation on the right foot following right femoropopliteal bypass that is failed to heal.  He is now indicated for revision of the transmetatarsal amputation with debridement.  Findings: Unfortunately plantar flap was mostly necrotic this was removed down to fairly superficial skin.  The distal end of the flap was completely removed.  There is tracking on the plantar surface which may communicate all the way back to the heel ulceration.  We were able to remove back to healthy bone and closed the lateral aspect of the TMA and packed the medial aspect with wet-to-dry gauze.   He will likely require right below-knee amputation.   Procedure:  The patient was identified in the holding area and taken to the operating room where MAC anesthesia was induced.  He was sterilely prepped and draped in the right lower extremity usual fashion antibiotics were administered timeout was called.  We began by injecting 10 cc total 1% lidocaine.  We then took down his previous TMA.  There was necrotic tissue.  This was thoroughly irrigated.  We then debrided the plantar aspect remove much of that flap and debrided back to healthy appearing quite superficial tissue.  We then debrided back to healthy bone using rongeur and rasp.  We again thoroughly irrigated obtain hemostasis.  There was a tracking on the plantar aspect which may communicate to an ulcer on the heel.  There is nothing frankly necrotic there.  When all tissue appeared somewhat viable we elected to attempt closure laterally which was done with  3-0 nylon interrupted suture.  Medially we packed with wet-to-dry gauze.  Sterile dressing was placed followed by Ace bandage.  He was then awake from anesthesia having tolerated procedure without immediate complication.  All counts were correct at completion.  EBL: 25 cc     Anselma Herbel C. Donzetta Matters, MD [facility] Office: [phone] Pager: [phone]

## 2019-05-05 NOTE — Anesthesia Preprocedure Evaluation (Signed)
Anesthesia Evaluation  Patient identified by MRN, date of birth, ID band Patient awake    Reviewed: Allergy & Precautions, NPO status , Patient's Chart, lab work & pertinent test results, reviewed documented beta blocker date and time   History of Anesthesia Complications Negative for: history of anesthetic complications  Airway Mallampati: II  TM Distance: >3 FB Neck ROM: Full    Dental  (+) Dental Advisory Given   Pulmonary former smoker,    Pulmonary exam normal        Cardiovascular hypertension, Pt. on home beta blockers and Pt. on medications + CAD, + Past MI, + Peripheral Vascular Disease and +CHF  Normal cardiovascular exam+ Valvular Problems/Murmurs (s/p TAVR)    '20 TTE - EF 35-40%. Mildly increased left ventricular wall thickness. Left ventricular diastolic Doppler parameters are consistent with pseudonormalization. LA was mildly dilated. Mild-moderate MR. A Edwards Sapien bioprosthetic aortic valve (TAVR) valve is present in  the aortic position with no stenosis.    Neuro/Psych PSYCHIATRIC DISORDERS Anxiety  RLS   Neuromuscular disease (neuropathy)    GI/Hepatic Neg liver ROS, GERD  Controlled,  Endo/Other  negative endocrine ROS  Renal/GU ESRF and DialysisRenal disease     Musculoskeletal  (+) Arthritis ,   Abdominal   Peds  Hematology  (+) anemia ,  Thrombocytopenia    Anesthesia Other Findings Amyloidosis    Reproductive/Obstetrics                            Anesthesia Physical  Anesthesia Plan  ASA: III  Anesthesia Plan: MAC   Post-op Pain Management:    Induction: Intravenous  PONV Risk Score and Plan: 1 and Treatment may vary due to age or medical condition and Propofol infusion  Airway Management Planned: Nasal Cannula and Simple Face Mask  Additional Equipment: None  Intra-op Plan:   Post-operative Plan: Extubation in OR  Informed Consent: I have  reviewed the patients History and Physical, chart, labs and discussed the procedure including the risks, benefits and alternatives for the proposed anesthesia with the patient or authorized representative who has indicated his/her understanding and acceptance.       Plan Discussed with:   Anesthesia Plan Comments:        Anesthesia Quick Evaluation

## 2019-05-05 NOTE — Progress Notes (Signed)
[facility] Progress Note   Subjective:  Seen in room. Denies CP or dyspnea. TMA wound with drainage and partial dehiscence - rerports going for washout in OR today.  Objective Vitals:   05/04/19 1241 05/04/19 2052 05/05/19 0535 05/05/19 0827  BP: (!) 115/54 (!) 110/58 (!) 126/55 (!) 128/48  Pulse: (!) 107 79 70 77  Resp: 18 18 18 20   Temp: 98.4 F (36.9 C) 98.5 F (36.9 C) 98.8 F (37.1 C) 98.5 F (36.9 C)  TempSrc: Oral Oral Oral Oral  SpO2: 90% 95% 94% 95%  Weight:      Height:       Physical Exam General: Well appearing, NAD Heart: RRR; no murmur Lungs: CTA anteriorly Abdomen: soft Extremities: R TMA with dark wound edges which have separated, bloody drainage. No LLE edema Dialysis Access: R forearm AVF + thrill  Additional Objective Labs: Basic Metabolic Panel: Recent Labs  Lab 04/29/19 0713 04/30/19 0314 05/01/19 0222  NA 138 138 137  K 4.4 5.3* 5.1  CL 93* 96* 92*  CO2 22 26 26   GLUCOSE 96 123* 104*  BUN 49* 22 42*  CREATININE 9.57* 6.18* 8.04*  CALCIUM 8.4* 8.3* 8.4*   Liver Function Tests: Recent Labs  Lab 04/29/19 0713  AST 17  ALT 7  ALKPHOS 63  BILITOT 1.3*  PROT 6.7  ALBUMIN 3.1*   CBC: Recent Labs  Lab 04/29/19 0713 04/29/19 0713 04/30/19 0314 05/01/19 0222 05/05/19 0745  WBC 10.0   < > 15.5* 11.8* 9.7  HGB 10.3*   < > 10.1* 8.3* 8.9*  HCT 32.1*   < > 32.2* 26.6* 28.3*  MCV 105.6*  --  105.6* 106.4* 105.2*  PLT 134*   < > 134* 105* 145*   < > = values in this interval not displayed.   Medications: . sodium chloride    . magnesium sulfate bolus IVPB     . amiodarone  200 mg Oral Daily  . atorvastatin  80 mg Oral Q supper  . carvedilol  3.125 mg Oral BID  . Chlorhexidine Gluconate Cloth  6 each Topical Q0600  . clopidogrel  75 mg Oral Q supper  . darbepoetin (ARANESP) injection - DIALYSIS  100 mcg Intravenous Q Fri-HD  . docusate sodium  100 mg Oral Daily  . heparin  5,000 Units Subcutaneous Q8H  .  midodrine  10 mg Oral Q M,W,F-HD  . multivitamin  1 tablet Oral QHS  . pantoprazole  40 mg Oral Daily  . sodium chloride flush  3 mL Intravenous Q12H  . sucroferric oxyhydroxide  1,250 mg Oral TID WC  . vitamin B-12  1,000 mcg Oral QPM    Dialysis Orders: MWF at [facility] 4hr, 400/800, EDW 76.5kg, 2K/2Ca, UFP #4, AVF, heparin 2400 - Parsabiv 5mg  IV q HD - Calcitriol 1.66mcg PO q HD - Mircera 49mcg IV q 2 weeks (last given 66mcg on 1/20, Hgb 10.3 on 2/10)  Assessment/Plan: 1. PAD with gangrenous changes: S/p R TMA on 2/17 with Dr. Donzetta Matters -> now with poor healing, possible infection. Going for washout in OR today. 2. ESRD:Continue HD per usual MWF sched - next 2/24. Midodrine 10 mg pre-HD. 3. Hypertension/volume:BP controlled, no edema - he is below EDW - will be lowered on d/c. 4. Anemia:Hgb 8.9 - Aranesp q Friday. 5. Metabolic bone disease:Ca ok, Phos pending. Continue velphoro as binder 6. Hx TAVR 7. CAD/HFrEF   Veneta Penton, PA-C 05/05/2019, 9:40 AM  [facility] Pager: [phone]

## 2019-05-05 NOTE — Progress Notes (Addendum)
Progress Note    05/05/2019 7:25 AM 6 Days Post-Op  Subjective:   Patient was not feeling well yesterday afternoon during dialysis. Had to stop Dialysis a few minutes early.Feeling better this morning. Pain manageable with po pain medication. He did not work with PT yesterday because of not feeling well   Vitals:   05/04/19 2052 05/05/19 0535  BP: (!) 110/58 (!) 126/55  Pulse: 79 70  Resp: 18 18  Temp: 98.5 F (36.9 C) 98.8 F (37.1 C)  SpO2: 95% 94%   Physical Exam: General: resting comfortably in bed Lungs:  Non labored Incisions:  Right lower extremity incisions clean, dry, intact. Healing well. Right TMA stump with ischemic changes of lateral suture line, malodorous, with some wound dehiscence  Extremities: 2+ bilateral femoral pulses, Right Doppler DP/PT/peroneal signals, palpable pulse in right lower extremity bypass Abdomen:  Soft, non tender Neurologic: alert and oriented  CBC    Component Value Date/Time   WBC 11.8 (H) 05/01/2019 0222   RBC 2.50 (L) 05/01/2019 0222   HGB 8.3 (L) 05/01/2019 0222   HGB 11.4 (L) 11/12/2018 1637   HGB 10.8 (L) 03/14/2017 0921   HCT 26.6 (L) 05/01/2019 0222   HCT 35.0 (L) 11/12/2018 1637   HCT 32.5 (L) 03/14/2017 0921   PLT 105 (L) 05/01/2019 0222   PLT 51 (LL) 11/12/2018 1637   MCV 106.4 (H) 05/01/2019 0222   MCV 103 (H) 11/12/2018 1637   MCV 106.2 (H) 03/14/2017 0921   MCH 33.2 05/01/2019 0222   MCHC 31.2 05/01/2019 0222   RDW 14.0 05/01/2019 0222   RDW 14.6 11/12/2018 1637   RDW 14.6 03/14/2017 0921   LYMPHSABS 1.0 11/12/2018 1637   LYMPHSABS 0.8 (L) 03/14/2017 0921   MONOABS 0.4 10/20/2018 0638   MONOABS 0.6 03/14/2017 0921   EOSABS 0.2 11/12/2018 1637   BASOSABS 0.0 11/12/2018 1637   BASOSABS 0.0 03/14/2017 0921    BMET    Component Value Date/Time   NA 137 05/01/2019 0222   NA 135 07/14/2018 0909   NA 141 03/14/2017 0921   K 5.1 05/01/2019 0222   K 4.1 03/14/2017 0921   CL 92 (L) 05/01/2019 0222   CO2 26  05/01/2019 0222   CO2 33 (H) 03/14/2017 0921   GLUCOSE 104 (H) 05/01/2019 0222   GLUCOSE 144 (H) 03/14/2017 0921   BUN 42 (H) 05/01/2019 0222   BUN 76 (HH) 07/14/2018 0909   BUN 30.1 (H) 03/14/2017 0921   CREATININE 8.04 (H) 05/01/2019 0222   CREATININE 9.16 (HH) 09/17/2017 0954   CREATININE 8.1 (HH) 03/14/2017 0921   CALCIUM 8.4 (L) 05/01/2019 0222   CALCIUM 9.5 03/14/2017 0921   GFRNONAA 6 (L) 05/01/2019 0222   GFRNONAA 5 (L) 09/17/2017 0954   GFRNONAA 24 (L) 06/30/2014 1528   GFRAA 7 (L) 05/01/2019 0222   GFRAA 6 (L) 09/17/2017 0954   GFRAA 28 (L) 06/30/2014 1528    INR    Component Value Date/Time   INR 1.2 04/29/2019 0713   INR 1.10 (L) 01/21/2015 0809     Intake/Output Summary (Last 24 hours) at 05/05/2019 0725 Last data filed at 05/04/2019 1123 Gross per 24 hour  Intake --  Output 2184 ml  Net -2184 ml     Assessment/Plan:  69 y.o. male is s/p right TMA 6 Days Post-Op. Patient feeling better today. His daughter made selection and he has bed available at [facility] but now pending changes to right TMA stump. He has some ischemic changes  of the right TMA stump this morning and malodorous. Will get CBC. Discussed patient with Dr. Donzetta Matters. He is planning to take patient to OR today to wash out TMA stump  DVT prophylaxis:  SQ heparin   Karoline Caldwell, PA-C [facility] [phone] 05/05/2019 7:25 AM   I have independently interviewed and examined patient and agree with PA assessment and plan above. Unfortunately has dehiscence of tma and will plan for debridement with possible wound vac placement.   Diala Waxman C. Donzetta Matters, MD [facility] [place] Office: [phone] Pager: [phone]

## 2019-05-06 LAB — GLUCOSE, CAPILLARY: Glucose-Capillary: 93 mg/dL (ref 70–99)

## 2019-05-06 MED ORDER — CHLORHEXIDINE GLUCONATE CLOTH 2 % EX PADS
6.0000 | MEDICATED_PAD | Freq: Every day | CUTANEOUS | Status: DC
  Administered 2019-05-07: 6 via TOPICAL

## 2019-05-06 NOTE — Progress Notes (Addendum)
Patient very lethargic/sleepy this afternoon, Patient neuro intact.  temp 99.3 , bp 159/73 heart rate 86 on monitor,  CCMD made aware patient on monitor. sats 94 on room air. Dressing clean dry intact on this phone.  Patient will arouse but will only answer yes or no  Danton Clap [facility] made aware will continue to monitor patient. Koi Zangara, Bettina Gavia RN

## 2019-05-06 NOTE — Anesthesia Preprocedure Evaluation (Addendum)
Anesthesia Evaluation  Patient identified by MRN, date of birth, ID band Patient awake    Reviewed: Allergy & Precautions, NPO status , Patient's Chart, lab work & pertinent test results  Airway Mallampati: II  TM Distance: >3 FB Neck ROM: Full    Dental  (+) Edentulous Upper, Edentulous Lower   Pulmonary former smoker,    Pulmonary exam normal breath sounds clear to auscultation       Cardiovascular hypertension, Pt. on home beta blockers + CAD, + Past MI, + Peripheral Vascular Disease and +CHF  Normal cardiovascular exam Rhythm:Regular Rate:Normal  ECG: Normal sinus rhythm, rate 90 Right bundle branch block  S/p TAVR  ECHO: 1. The left ventricle has moderately reduced systolic function, with an ejection fraction of 35- 40%. The cavity size was normal. There is mildly increased left ventricular wall thickness. Left ventricular diastolic Doppler parameters are consistent with pseudonormalization. 2. The right ventricle has normal systolic function. The cavity was normal. There is no increase in right ventricular wall thickness. 3. Left atrial size was mildly dilated. 4. Mitral valve regurgitation is mild to moderate by color flow Doppler. 5. A Edwards Sapien bioprosthetic aortic valve (TAVR) valve is present in the aortic position. 6. No stenosis of the aortic valve. 7. The aorta is normal unless otherwise noted. 8. The aortic root and ascending aorta are normal in size and structure. 9. The atrial septum is grossly normal. 10. The average left ventricular global longitudinal strain is -7.5 %.   Neuro/Psych Anxiety negative neurological ROS     GI/Hepatic negative GI ROS, Neg liver ROS,   Endo/Other  negative endocrine ROS  Renal/GU ESRF and DialysisRenal disease     Musculoskeletal negative musculoskeletal ROS (+)   Abdominal   Peds  Hematology  (+) anemia , HLD Thrombocytopenia   Anesthesia Other  Findings REMOVAL OF NON VIABLE TISSUE  Reproductive/Obstetrics                            Anesthesia Physical Anesthesia Plan  ASA: IV  Anesthesia Plan: General   Post-op Pain Management:    Induction: Intravenous  PONV Risk Score and Plan: 2 and Ondansetron, Dexamethasone and Treatment may vary due to age or medical condition  Airway Management Planned: Oral ETT  Additional Equipment:   Intra-op Plan:   Post-operative Plan: Extubation in OR  Informed Consent: I have reviewed the patients History and Physical, chart, labs and discussed the procedure including the risks, benefits and alternatives for the proposed anesthesia with the patient or authorized representative who has indicated his/her understanding and acceptance.       Plan Discussed with: CRNA  Anesthesia Plan Comments:       Anesthesia Quick Evaluation

## 2019-05-06 NOTE — Progress Notes (Signed)
  Jose Garrett Progress Note   Subjective:  Seen in room. No SOB or CP. Refused HD today, too tired. Going for BKA R tomorrow.   Objective Vitals:   05/05/19 2113 05/06/19 0416 05/06/19 0818 05/06/19 0823  BP: 122/60 135/60 (!) 158/70   Pulse:  66  61  Resp:      Temp:  98 F (36.7 C)    TempSrc:  Oral    SpO2:  94% 99%   Weight:      Height:       Physical Exam General: Well appearing, NAD Heart: RRR; no murmur Lungs: CTA anteriorly Abdomen: soft Extremities: R TMA , no LE edema Dialysis Access: R forearm AVF + thrill  Dialysis: MWF at [facility] 4hr, 400/800, EDW 76.5kg, 2K/2Ca, UFP #4, AVF, heparin 2400 - Parsabiv 5mg  IV q HD - Calcitriol 1.8mcg PO q HD - Mircera 4mcg IV q 2 weeks (last given 5mcg on 1/20, Hgb 10.3 on 2/10)  Assessment/Plan: 1. PAD with gangrenous changes: S/p R TMA on 2/17 with Dr. Donzetta Matters -> now with poor healing, going for R BKA tomorrow.   2.  ESRD: HD MWF. Refused HD today. Fortunately vol and labs are all okay. Will reassess tomorrow see if he will agree postop. Midodrine 10 mg pre-HD. 3. Hypertension/volume:BP controlled, no edema, below EDW. Min UF w/ HD.  4. Anemia:Hgb 8.9 - Aranesp q Friday. 5. Metabolic bone disease:Ca ok, Phos pending. Continue velphoro as binder 6. Hx TAVR 7. CAD/HFrEF   [name], MD 05/06/2019, 11:41 AM  Additional Objective Labs: Basic Metabolic Panel: Recent Labs  Lab 04/30/19 0314 05/01/19 0222 05/05/19 1418  NA 138 137 137  K 5.3* 5.1 4.2  CL 96* 92* 94*  CO2 26 26 25   GLUCOSE 123* 104* 105*  BUN 22 42* 39*  CREATININE 6.18* 8.04* 6.38*  CALCIUM 8.3* 8.4* 8.7*   Liver Function Tests: No results for input(s): AST, ALT, ALKPHOS, BILITOT, PROT, ALBUMIN in the last 168 hours. CBC: Recent Labs  Lab 04/30/19 0314 05/01/19 0222 05/05/19 0745  WBC 15.5* 11.8* 9.7  HGB 10.1* 8.3* 8.9*  HCT 32.2* 26.6* 28.3*  MCV 105.6* 106.4* 105.2*  PLT 134* 105* 145*   Medications: . sodium  chloride    . sodium chloride    . magnesium sulfate bolus IVPB     . amiodarone  200 mg Oral Daily  . atorvastatin  80 mg Oral Q supper  . carvedilol  3.125 mg Oral BID  . Chlorhexidine Gluconate Cloth  6 each Topical Q0600  . clopidogrel  75 mg Oral Q supper  . darbepoetin (ARANESP) injection - DIALYSIS  100 mcg Intravenous Q Fri-HD  . docusate sodium  100 mg Oral Daily  . heparin  5,000 Units Subcutaneous Q8H  . midodrine  10 mg Oral Q M,W,F-HD  . multivitamin  1 tablet Oral QHS  . pantoprazole  40 mg Oral Daily  . sodium chloride flush  3 mL Intravenous Q12H  . sucroferric oxyhydroxide  1,250 mg Oral TID WC  . vitamin B-12  1,000 mcg Oral QPM

## 2019-05-06 NOTE — Progress Notes (Signed)
Chaplain received request from Sharyon Cable of [facility] asking for Chaplain to visit Pt. [name], who had received some disparaging news. Chaplain visited with Jose Garrett who was very soft spoken and sleepy, but otherwise pleasant. Jose is not happy about the need for further amputation of his leg, but understands the needs for surgery in order to restore his health. Jose has good family and spiritual support. Chaplain offered ministry of presence, and prayer. Chaplains remain available for support as needs arise.   Chaplain Resident, Evelene Croon, M Div [phone] on-call pager [phone] personal pager

## 2019-05-06 NOTE — Progress Notes (Signed)
Dr. Johnney Ou with nephrology made aware of patient status, no new orders received will monitor patient. Winslow Verrill, Bettina Gavia RN

## 2019-05-06 NOTE — Progress Notes (Addendum)
POST OPERATIVE OFFICE NOTE    CC:  F/u for surgery, POD 1  HPI:  This is a 69 y.o. male who is s/p Revision of right transmetatarsal amputation yesterday.  The patient reports he was told he is going back to surgery today and is refusing breakfast on dialysis.  No Known Allergies  Current Facility-Administered Medications  Medication Dose Route Frequency Provider Last Rate Last Admin  . 0.9 %  sodium chloride infusion  250 mL Intravenous PRN Laurence Slate M, PA-C      . 0.9 %  sodium chloride infusion   Intravenous Continuous Oleta Mouse, MD      . acetaminophen (TYLENOL) tablet 325-650 mg  325-650 mg Oral Q4H PRN Laurence Slate M, PA-C       Or  . acetaminophen (TYLENOL) suppository 325-650 mg  325-650 mg Rectal Q4H PRN Ulyses Amor, PA-C      . alum & mag hydroxide-simeth (MAALOX/MYLANTA) 200-200-20 MG/5ML suspension 15-30 mL  15-30 mL Oral Q2H PRN Laurence Slate M, PA-C      . amiodarone (PACERONE) tablet 200 mg  200 mg Oral Daily Laurence Slate M, PA-C   200 mg at 05/05/19 1109  . atorvastatin (LIPITOR) tablet 80 mg  80 mg Oral Q supper Laurence Slate M, PA-C   80 mg at 05/04/19 1723  . bisacodyl (DULCOLAX) EC tablet 5 mg  5 mg Oral Daily PRN Laurence Slate M, PA-C      . carvedilol (COREG) tablet 3.125 mg  3.125 mg Oral BID Laurence Slate M, PA-C   3.125 mg at 05/05/19 2111  . Chlorhexidine Gluconate Cloth 2 % PADS 6 each  6 each Topical Q0600 Loren Racer, PA-C   6 each at 05/06/19 0559  . clopidogrel (PLAVIX) tablet 75 mg  75 mg Oral Q supper Laurence Slate M, PA-C   75 mg at 05/04/19 1723  . Darbepoetin Alfa (ARANESP) injection 100 mcg  100 mcg Intravenous Q Janit Bern, MD   100 mcg at 05/01/19 1734  . docusate sodium (COLACE) capsule 100 mg  100 mg Oral Daily Laurence Slate M, PA-C   100 mg at 05/03/19 0845  . guaiFENesin-dextromethorphan (ROBITUSSIN DM) 100-10 MG/5ML syrup 15 mL  15 mL Oral Q4H PRN Laurence Slate M, PA-C      . heparin injection 5,000 Units   5,000 Units Subcutaneous Q8H Laurence Slate M, PA-C   5,000 Units at 05/06/19 0602  . hydrALAZINE (APRESOLINE) injection 5 mg  5 mg Intravenous Q20 Min PRN Laurence Slate M, PA-C      . HYDROmorphone (DILAUDID) injection 0.5-1 mg  0.5-1 mg Intravenous Q2H PRN Laurence Slate M, PA-C   1 mg at 05/05/19 2110  . labetalol (NORMODYNE) injection 10 mg  10 mg Intravenous Q10 min PRN Ulyses Amor, PA-C      . LORazepam (ATIVAN) tablet 1 mg  1 mg Oral BID PRN Laurence Slate M, PA-C      . magnesium sulfate IVPB 2 g 50 mL  2 g Intravenous Daily PRN Laurence Slate M, PA-C      . methocarbamol (ROBAXIN) tablet 500 mg  500 mg Oral BID PRN Ulyses Amor, PA-C   500 mg at 05/05/19 1813  . metoprolol tartrate (LOPRESSOR) injection 2-5 mg  2-5 mg Intravenous Q2H PRN Laurence Slate M, PA-C      . midodrine (PROAMATINE) tablet 10 mg  10 mg Oral Q M,W,F-HD Laurence Slate M, PA-C   10 mg at 05/04/19 6546  .  multivitamin (RENA-VIT) tablet 1 tablet  1 tablet Oral QHS Ulyses Amor, PA-C   1 tablet at 05/05/19 2110  . ondansetron (ZOFRAN) injection 4 mg  4 mg Intravenous Q6H PRN Laurence Slate M, PA-C   4 mg at 05/06/19 0401  . oxyCODONE (Oxy IR/ROXICODONE) immediate release tablet 5-10 mg  5-10 mg Oral Q4H PRN Laurence Slate M, PA-C   5 mg at 05/06/19 0154  . pantoprazole (PROTONIX) EC tablet 40 mg  40 mg Oral Daily Laurence Slate M, PA-C   40 mg at 05/05/19 1109  . phenol (CHLORASEPTIC) mouth spray 1 spray  1 spray Mouth/Throat PRN Laurence Slate M, PA-C      . prochlorperazine (COMPAZINE) injection 10 mg  10 mg Intravenous Q6H PRN Angelia Mould, MD      . senna-docusate (Senokot-S) tablet 1 tablet  1 tablet Oral QHS PRN Laurence Slate M, PA-C      . sodium chloride flush (NS) 0.9 % injection 3 mL  3 mL Intravenous Q12H Laurence Slate M, PA-C   3 mL at 05/05/19 2116  . sodium chloride flush (NS) 0.9 % injection 3 mL  3 mL Intravenous PRN Laurence Slate M, PA-C      . sucroferric oxyhydroxide [facility]) chewable tablet  1,250 mg  1,250 mg Oral TID WC Laurence Slate M, PA-C   1,250 mg at 05/02/19 1221  . temazepam (RESTORIL) capsule 15 mg  15 mg Oral QHS PRN Laurence Slate M, PA-C      . vitamin B-12 (CYANOCOBALAMIN) tablet 1,000 mcg  1,000 mcg Oral QPM Laurence Slate M, PA-C   1,000 mcg at 05/04/19 1723     ROS:  See HPI  Physical Exam:       Awake alert no apparent distress   Extremities: Right lower extremity bypass incisions are all healing without signs of infection.  Dressing removed from right foot.  Open area of incision with clotted blood.  Positive Doppler signal in dorsalis pedis and posterior tibial arteries. Neuro: And oriented x4  Assessment/Plan:  This is a 69 y.o. male who is s/p: Right femoropopliteal bypass.  Nonhealing right transmetatarsal amputation site with return to OR yesterday for debridement.  The wet-to-dry dressing is replaced and his foot and ankle wrapped in Kerlix and Ace wrap   -Jannet Mantis, PA-C [facility] [phone]  I have independently interviewed and examined patient and agree with PA assessment and plan above. OR tomorrow for right bka I have discussed this plan with the patient he demonstrates good understanding.  Kasen Sako C. Donzetta Matters, MD [facility] [place] Office: [phone] Pager: [phone]

## 2019-05-06 NOTE — Progress Notes (Signed)
Patient declining going to dialysis today, he states he is supposed to go to surgery and does not "feel like he can do both" patient appears sleepy this AM. Risa Grill [facility] made aware and into see patient. Will monitor patient. Jaquala Fuller, Bettina Gavia rN

## 2019-05-06 NOTE — Progress Notes (Signed)
Dr. Donzetta Matters Please call daughter Jose Garrett she has questions [phone] . Jose Garrett, Jose Gavia RN

## 2019-05-06 NOTE — Progress Notes (Signed)
Patient 'states he is tired today" he still does not want to go to dialysis. Patient with episode of nausea and vomiting. zofran given as ordered as needed for nausea/vomiting will monitor patient. Amadi Frady, Bettina Gavia RN

## 2019-05-06 NOTE — Progress Notes (Signed)
PT Cancellation Note  Patient Details Name: Jose Garrett MRN: [id number] DOB: 1950/05/26   Cancelled Treatment:    Reason Eval/Treat Not Completed: Patient declined, no reason specified Attempted to see pt X 2 today for PT treatment. This pm pt shivering and nauseous upon arrival and RN in room to assess pt. PT will continue to follow acutely.    Earney Navy, PTA [facility] Services Pager: [phone] Office: [phone]   05/06/2019, 3:11 PM

## 2019-05-06 NOTE — Significant Event (Signed)
Rapid Response Event Note  Overview: Time Called: 1721 Arrival Time: 1730 Event Type: Other (Comment)(Lethargic, hard to arouse.) Pt hard to arouse. Twitching noted in pt's hands.   Initial Focused Assessment: Pt had hemodialysis on Monday, but refused dialysis today. This afternoon he became lethargic, hard to arouse. Pt is vomiting upon my arrival. Lung sounds are clear, diminished. Pt grips are equal, no drift in bilateral arms. Pt endorses bilateral leg weakness. Right leg drifts back to bed quicker than left. Pt had revision of right transmetatarsal amputation yesterday. Pt endorses equal sensation of face and extremities. Face symmetrical. Pt follows commands, but takes verbal and physical stimuli to do so. Pt is orient to person, tells me he's at [facility], and believes the year to be 2020. Pt noted to have twitching in his hands.   Interventions: -ST depression noted on telemetry- 12 lead EKG completed -Primary RN spoke with Dr. Johnney Ou with neurology who recommended continuing to monitor pt- QTC > 500, unable to give additional dose of Zofran at this time. -Pt placed on progressive monitoring  Plan of Care (if not transferred): -Treat N/V -Frequent neuro assessment  Event Summary:   at    Name of Consulting Physician Notified: Dr. Johnney Ou at 1740  Outcome: Stayed in room and stabalized  Event End Time: [place]unction

## 2019-05-07 ENCOUNTER — Inpatient Hospital Stay (HOSPITAL_COMMUNITY): Payer: Medicare Other | Admitting: Anesthesiology

## 2019-05-07 ENCOUNTER — Encounter (HOSPITAL_COMMUNITY)
Admission: AD | Disposition: A | Discharge status: Rehabilitation Facility | Payer: Self-pay | Source: Home / Self Care | Attending: Vascular Surgery

## 2019-05-07 DIAGNOSIS — I96 Gangrene, not elsewhere classified: Secondary | ICD-10-CM

## 2019-05-07 HISTORY — PX: AMPUTATION: SHX166

## 2019-05-07 LAB — POCT I-STAT, CHEM 8
BUN: 85 mg/dL — ABNORMAL HIGH (ref 8–23)
Calcium, Ion: 1.02 mmol/L — ABNORMAL LOW (ref 1.15–1.40)
Chloride: 96 mmol/L — ABNORMAL LOW (ref 98–111)
Creatinine, Ser: 10.6 mg/dL — ABNORMAL HIGH (ref 0.61–1.24)
Glucose, Bld: 113 mg/dL — ABNORMAL HIGH (ref 70–99)
HCT: 28 % — ABNORMAL LOW (ref 39.0–52.0)
Hemoglobin: 9.5 g/dL — ABNORMAL LOW (ref 13.0–17.0)
Potassium: 5.9 mmol/L — ABNORMAL HIGH (ref 3.5–5.1)
Sodium: 134 mmol/L — ABNORMAL LOW (ref 135–145)
TCO2: 24 mmol/L (ref 22–32)

## 2019-05-07 SURGERY — AMPUTATION BELOW KNEE
Anesthesia: General | Site: Knee | Laterality: Right

## 2019-05-07 MED ORDER — ONDANSETRON HCL 4 MG/2ML IJ SOLN
INTRAMUSCULAR | Status: DC | PRN
  Administered 2019-05-07: 4 mg via INTRAVENOUS

## 2019-05-07 MED ORDER — 0.9 % SODIUM CHLORIDE (POUR BTL) OPTIME
TOPICAL | Status: DC | PRN
  Administered 2019-05-07: 1000 mL

## 2019-05-07 MED ORDER — DEXAMETHASONE SODIUM PHOSPHATE 10 MG/ML IJ SOLN
INTRAMUSCULAR | Status: DC | PRN
  Administered 2019-05-07: 4 mg via INTRAVENOUS

## 2019-05-07 MED ORDER — PROPOFOL 10 MG/ML IV BOLUS
INTRAVENOUS | Status: DC | PRN
  Administered 2019-05-07: 100 mg via INTRAVENOUS

## 2019-05-07 MED ORDER — ONDANSETRON HCL 4 MG/2ML IJ SOLN: 4.0000 mg | Freq: Once | INTRAMUSCULAR | Status: DC | PRN

## 2019-05-07 MED ORDER — CHLORHEXIDINE GLUCONATE CLOTH 2 % EX PADS
6.0000 | MEDICATED_PAD | Freq: Every day | CUTANEOUS | Status: DC
  Administered 2019-05-08 – 2019-05-13 (×5): 6 via TOPICAL

## 2019-05-07 MED ORDER — ONDANSETRON HCL 4 MG/2ML IJ SOLN
INTRAMUSCULAR | Status: AC
  Filled 2019-05-07: qty 2

## 2019-05-07 MED ORDER — MIDODRINE HCL 5 MG PO TABS
10.0000 mg | ORAL_TABLET | Freq: Once | ORAL | Status: AC
  Filled 2019-05-07: qty 2

## 2019-05-07 MED ORDER — FENTANYL CITRATE (PF) 250 MCG/5ML IJ SOLN
INTRAMUSCULAR | Status: DC | PRN
  Administered 2019-05-07: 150 ug via INTRAVENOUS
  Administered 2019-05-07 (×2): 50 ug via INTRAVENOUS

## 2019-05-07 MED ORDER — NEOSTIGMINE METHYLSULFATE 10 MG/10ML IV SOLN
INTRAVENOUS | Status: DC | PRN
  Administered 2019-05-07: 4 mg via INTRAVENOUS

## 2019-05-07 MED ORDER — PHENYLEPHRINE 40 MCG/ML (10ML) SYRINGE FOR IV PUSH (FOR BLOOD PRESSURE SUPPORT)
PREFILLED_SYRINGE | INTRAVENOUS | Status: DC | PRN
  Administered 2019-05-07: 120 ug via INTRAVENOUS
  Administered 2019-05-07: 160 ug via INTRAVENOUS

## 2019-05-07 MED ORDER — GLYCOPYRROLATE 0.2 MG/ML IJ SOLN
INTRAMUSCULAR | Status: DC | PRN
  Administered 2019-05-07: .6 mg via INTRAVENOUS

## 2019-05-07 MED ORDER — ROCURONIUM BROMIDE 100 MG/10ML IV SOLN
INTRAVENOUS | Status: DC | PRN
  Administered 2019-05-07: 30 mg via INTRAVENOUS
  Administered 2019-05-07: 20 mg via INTRAVENOUS

## 2019-05-07 MED ORDER — PHENYLEPHRINE HCL-NACL 10-0.9 MG/250ML-% IV SOLN
INTRAVENOUS | Status: DC | PRN
  Administered 2019-05-07: 25 ug/min via INTRAVENOUS

## 2019-05-07 MED ORDER — FENTANYL CITRATE (PF) 100 MCG/2ML IJ SOLN: 25.0000 ug | INTRAMUSCULAR | Status: DC | PRN

## 2019-05-07 MED ORDER — OXYCODONE HCL 5 MG PO TABS
ORAL_TABLET | ORAL | Status: AC
  Filled 2019-05-07: qty 2

## 2019-05-07 MED ORDER — CEFAZOLIN SODIUM-DEXTROSE 1-4 GM/50ML-% IV SOLN
INTRAVENOUS | Status: DC | PRN
  Administered 2019-05-07: 1 g via INTRAVENOUS

## 2019-05-07 MED ORDER — SODIUM CHLORIDE 0.9 % IV SOLN: INTRAVENOUS | Status: DC | PRN

## 2019-05-07 MED ORDER — MIDODRINE HCL 5 MG PO TABS
ORAL_TABLET | ORAL | Status: AC
  Administered 2019-05-07: 10 mg via ORAL
  Filled 2019-05-07: qty 2

## 2019-05-07 MED ORDER — PROPOFOL 10 MG/ML IV BOLUS
INTRAVENOUS | Status: AC
  Filled 2019-05-07: qty 20

## 2019-05-07 MED ORDER — FENTANYL CITRATE (PF) 250 MCG/5ML IJ SOLN
INTRAMUSCULAR | Status: AC
  Filled 2019-05-07: qty 5

## 2019-05-07 MED ORDER — LIDOCAINE 2% (20 MG/ML) 5 ML SYRINGE
INTRAMUSCULAR | Status: DC | PRN
  Administered 2019-05-07: 60 mg via INTRAVENOUS

## 2019-05-07 MED ORDER — CEFAZOLIN SODIUM 1 G IJ SOLR
INTRAMUSCULAR | Status: AC
  Filled 2019-05-07: qty 10

## 2019-05-07 SURGICAL SUPPLY — 57 items
BANDAGE ESMARK 6X9 LF (GAUZE/BANDAGES/DRESSINGS) IMPLANT
BLADE SAW GIGLI 510 (BLADE) ×2 IMPLANT
BNDG CMPR 9X6 STRL LF SNTH (GAUZE/BANDAGES/DRESSINGS)
BNDG COHESIVE 6X5 TAN STRL LF (GAUZE/BANDAGES/DRESSINGS) ×2 IMPLANT
BNDG ELASTIC 4X5.8 VLCR STR LF (GAUZE/BANDAGES/DRESSINGS) ×2 IMPLANT
BNDG ELASTIC 6X5.8 VLCR STR LF (GAUZE/BANDAGES/DRESSINGS) ×2 IMPLANT
BNDG ESMARK 6X9 LF (GAUZE/BANDAGES/DRESSINGS)
BNDG GAUZE ELAST 4 BULKY (GAUZE/BANDAGES/DRESSINGS) ×1 IMPLANT
CANISTER SUCT 3000ML PPV (MISCELLANEOUS) ×2 IMPLANT
CLIP VESOCCLUDE MED 6/CT (CLIP) IMPLANT
COVER SURGICAL LIGHT HANDLE (MISCELLANEOUS) ×2 IMPLANT
COVER WAND RF STERILE (DRAPES) ×2 IMPLANT
CUFF TOURN SGL QUICK 34 (TOURNIQUET CUFF)
CUFF TOURN SGL QUICK 42 (TOURNIQUET CUFF) IMPLANT
CUFF TRNQT CYL 34X4.125X (TOURNIQUET CUFF) IMPLANT
DRAIN CHANNEL 19F RND (DRAIN) IMPLANT
DRAPE HALF SHEET 40X57 (DRAPES) ×2 IMPLANT
DRAPE INCISE IOBAN 66X45 STRL (DRAPES) IMPLANT
DRAPE ORTHO SPLIT 77X108 STRL (DRAPES) ×4
DRAPE SURG ORHT 6 SPLT 77X108 (DRAPES) ×2 IMPLANT
DRESSING PREVENA PLUS CUSTOM (GAUZE/BANDAGES/DRESSINGS) IMPLANT
DRSG ADAPTIC 3X8 NADH LF (GAUZE/BANDAGES/DRESSINGS) ×2 IMPLANT
DRSG PREVENA PLUS CUSTOM (GAUZE/BANDAGES/DRESSINGS)
ELECT REM PT RETURN 9FT ADLT (ELECTROSURGICAL) ×2
ELECTRODE REM PT RTRN 9FT ADLT (ELECTROSURGICAL) ×1 IMPLANT
EVACUATOR SILICONE 100CC (DRAIN) IMPLANT
GAUZE 4X4 16PLY RFD (DISPOSABLE) ×1 IMPLANT
GAUZE SPONGE 4X4 12PLY STRL (GAUZE/BANDAGES/DRESSINGS) ×2 IMPLANT
GLOVE BIOGEL PI IND STRL 7.5 (GLOVE) ×1 IMPLANT
GLOVE BIOGEL PI INDICATOR 7.5 (GLOVE) ×1
GLOVE SURG SS PI 7.5 STRL IVOR (GLOVE) ×2 IMPLANT
GOWN STRL REUS W/ TWL LRG LVL3 (GOWN DISPOSABLE) ×2 IMPLANT
GOWN STRL REUS W/ TWL XL LVL3 (GOWN DISPOSABLE) ×1 IMPLANT
GOWN STRL REUS W/TWL LRG LVL3 (GOWN DISPOSABLE) ×4
GOWN STRL REUS W/TWL XL LVL3 (GOWN DISPOSABLE) ×2
KIT BASIN OR (CUSTOM PROCEDURE TRAY) ×2 IMPLANT
KIT TURNOVER KIT B (KITS) ×2 IMPLANT
NS IRRIG 1000ML POUR BTL (IV SOLUTION) ×2 IMPLANT
PACK GENERAL/GYN (CUSTOM PROCEDURE TRAY) ×2 IMPLANT
PAD ARMBOARD 7.5X6 YLW CONV (MISCELLANEOUS) ×4 IMPLANT
PENCIL SMOKE EVACUATOR (MISCELLANEOUS) ×1 IMPLANT
PREVENA RESTOR ARTHOFORM 46X30 (CANNISTER) IMPLANT
STAPLER VISISTAT 35W (STAPLE) ×2 IMPLANT
STOCKINETTE IMPERVIOUS LG (DRAPES) ×2 IMPLANT
SUT BONE WAX W31G (SUTURE) IMPLANT
SUT ETHILON 3 0 PS 1 (SUTURE) IMPLANT
SUT SILK 0 TIES 10X30 (SUTURE) ×2 IMPLANT
SUT SILK 2 0 (SUTURE)
SUT SILK 2 0 SH CR/8 (SUTURE) ×2 IMPLANT
SUT SILK 2-0 18XBRD TIE 12 (SUTURE) IMPLANT
SUT SILK 3 0 (SUTURE)
SUT SILK 3-0 18XBRD TIE 12 (SUTURE) IMPLANT
SUT VIC AB 2-0 CT1 18 (SUTURE) ×5 IMPLANT
TAPE UMBILICAL COTTON 1/8X30 (MISCELLANEOUS) ×1 IMPLANT
TOWEL GREEN STERILE (TOWEL DISPOSABLE) ×4 IMPLANT
UNDERPAD 30X30 (UNDERPADS AND DIAPERS) ×2 IMPLANT
WATER STERILE IRR 1000ML POUR (IV SOLUTION) ×2 IMPLANT

## 2019-05-07 NOTE — Progress Notes (Signed)
Spoke with patient concerning pain management. Pt aware that we will monitor closely to make sure he is not overly medicated as this was an issue yesterday 05/06/19. Pt given 10 mg oxycodone and 1 mg ativan to help with pain and relaxation. Pt given 500 mg robaxin one hour later to help with relaxation of stump as well. Patient took daily medication and drank coke. Patient turning phone off to get some sleep. Patient aware that we will not be giving medication to "knock me out". Pt resting with call bell within reach.  Will continue to monitor.

## 2019-05-07 NOTE — Anesthesia Procedure Notes (Signed)
Procedure Name: Intubation Date/Time: 05/07/2019 7:45 AM Performed by: Bryson Corona, CRNA Pre-anesthesia Checklist: Patient identified, Emergency Drugs available, Suction available and Patient being monitored Patient Re-evaluated:Patient Re-evaluated prior to induction Oxygen Delivery Method: Circle System Utilized Preoxygenation: Pre-oxygenation with 100% oxygen Induction Type: IV induction Ventilation: Oral airway inserted - appropriate to patient size and Two handed mask ventilation required Laryngoscope Size: Mac and 4 Grade View: Grade I Tube type: Oral Number of attempts: 1 Airway Equipment and Method: Stylet and Oral airway Placement Confirmation: ETT inserted through vocal cords under direct vision,  positive ETCO2 and breath sounds checked- equal and bilateral Secured at: 22 cm Tube secured with: Tape Dental Injury: Teeth and Oropharynx as per pre-operative assessment  Comments: 2 handed mask due to pt's beard.

## 2019-05-07 NOTE — Progress Notes (Deleted)
  [facility] Progress Note   Subjective:  Seen in room. Sp BKA this am.  K 5.9.   Objective Vitals:   05/07/19 0450 05/07/19 0900 05/07/19 0930 05/07/19 0933  BP: 113/73 (!) 141/59  129/81  Pulse: 69 72  71  Resp: 16 (!) 24  16  Temp: 99 F (37.2 C) 98 F (36.7 C) 97.6 F (36.4 C) 97.8 F (36.6 C)  TempSrc: Oral   Oral  SpO2: 100% 100%    Weight:      Height:       Physical Exam General: Well appearing, NAD Heart: RRR; no murmur Lungs: CTA anteriorly Abdomen: soft Extremities: R TMA , no LE edema Dialysis Access: R forearm AVF + thrill  Dialysis: MWF at [facility] 4hr, 400/800, EDW 76.5kg, 2K/2Ca, UFP #4, AVF, heparin 2400 - Parsabiv 5mg  IV q HD - Calcitriol 1.29mcg PO q HD - Mircera 92mcg IV q 2 weeks (last given 38mcg on 1/20, Hgb 10.3 on 2/10)  Assessment/Plan: 1. PAD with gangrenous changes: S/p R TMA on 2/17 with Dr. Donzetta Matters -> now is sp R BKA today this am.   2.  ESRD: HD MWF. Refused HD yest, will need HD a bit later today w/ K 5.9, have d/w pt.  Midodrine 10 mg pre-HD. 3. Hypertension/volume:BP controlled, no edema, below EDW. Min UF w/ HD.  4. Anemia:Hgb 8.9 - Aranesp q Friday. 5. Metabolic bone disease:Ca ok, Phos pending. Continue velphoro as binder 6. Hx TAVR 7. CAD/HFrEF   [name], MD 05/06/2019, 11:41 AM  Additional Objective Labs: Basic Metabolic Panel: Recent Labs  Lab 05/01/19 0222 05/05/19 1418 05/07/19 0705  NA 137 137 134*  K 5.1 4.2 5.9*  CL 92* 94* 96*  CO2 26 25  --   GLUCOSE 104* 105* 113*  BUN 42* 39* 85*  CREATININE 8.04* 6.38* 10.60*  CALCIUM 8.4* 8.7*  --    Liver Function Tests: No results for input(s): AST, ALT, ALKPHOS, BILITOT, PROT, ALBUMIN in the last 168 hours. CBC: Recent Labs  Lab 05/01/19 0222 05/05/19 0745 05/07/19 0705  WBC 11.8* 9.7  --   HGB 8.3* 8.9* 9.5*  HCT 26.6* 28.3* 28.0*  MCV 106.4* 105.2*  --   PLT 105* 145*  --    Medications: . sodium chloride    . sodium chloride     . magnesium sulfate bolus IVPB     . amiodarone  200 mg Oral Daily  . atorvastatin  80 mg Oral Q supper  . carvedilol  3.125 mg Oral BID  . Chlorhexidine Gluconate Cloth  6 each Topical Q0600  . Chlorhexidine Gluconate Cloth  6 each Topical Q0600  . clopidogrel  75 mg Oral Q supper  . darbepoetin (ARANESP) injection - DIALYSIS  100 mcg Intravenous Q Fri-HD  . docusate sodium  100 mg Oral Daily  . heparin  5,000 Units Subcutaneous Q8H  . midodrine  10 mg Oral Q M,W,F-HD  . multivitamin  1 tablet Oral QHS  . pantoprazole  40 mg Oral Daily  . sodium chloride flush  3 mL Intravenous Q12H  . sucroferric oxyhydroxide  1,250 mg Oral TID WC  . vitamin B-12  1,000 mcg Oral QPM

## 2019-05-07 NOTE — Progress Notes (Signed)
  Progress Note    05/07/2019 11:25 AM Day of Surgery  Subjective:  Resting comfortably when I entered room. Upon waking does complain of pain at surgical site   Vitals:   05/07/19 0930 05/07/19 0933  BP:  129/81  Pulse:  71  Resp:  16  Temp: 97.6 F (36.4 C) 97.8 F (36.6 C)  SpO2:     Physical Exam: General: Somewhat drowsy but arousable, in discomfort but no acute distress Lungs:  Non labored Extremities:  Right BKA stump dressings clean, dry and intact   CBC    Component Value Date/Time   WBC 9.7 05/05/2019 0745   RBC 2.69 (L) 05/05/2019 0745   HGB 9.5 (L) 05/07/2019 0705   HGB 11.4 (L) 11/12/2018 1637   HGB 10.8 (L) 03/14/2017 0921   HCT 28.0 (L) 05/07/2019 0705   HCT 35.0 (L) 11/12/2018 1637   HCT 32.5 (L) 03/14/2017 0921   PLT 145 (L) 05/05/2019 0745   PLT 51 (LL) 11/12/2018 1637   MCV 105.2 (H) 05/05/2019 0745   MCV 103 (H) 11/12/2018 1637   MCV 106.2 (H) 03/14/2017 0921   MCH 33.1 05/05/2019 0745   MCHC 31.4 05/05/2019 0745   RDW 13.8 05/05/2019 0745   RDW 14.6 11/12/2018 1637   RDW 14.6 03/14/2017 0921   LYMPHSABS 1.0 11/12/2018 1637   LYMPHSABS 0.8 (L) 03/14/2017 0921   MONOABS 0.4 10/20/2018 0638   MONOABS 0.6 03/14/2017 0921   EOSABS 0.2 11/12/2018 1637   BASOSABS 0.0 11/12/2018 1637   BASOSABS 0.0 03/14/2017 0921    BMET    Component Value Date/Time   NA 134 (L) 05/07/2019 0705   NA 135 07/14/2018 0909   NA 141 03/14/2017 0921   K 5.9 (H) 05/07/2019 0705   K 4.1 03/14/2017 0921   CL 96 (L) 05/07/2019 0705   CO2 25 05/05/2019 1418   CO2 33 (H) 03/14/2017 0921   GLUCOSE 113 (H) 05/07/2019 0705   GLUCOSE 144 (H) 03/14/2017 0921   BUN 85 (H) 05/07/2019 0705   BUN 76 (HH) 07/14/2018 0909   BUN 30.1 (H) 03/14/2017 0921   CREATININE 10.60 (H) 05/07/2019 0705   CREATININE 9.16 (HH) 09/17/2017 0954   CREATININE 8.1 (HH) 03/14/2017 0921   CALCIUM 8.7 (L) 05/05/2019 1418   CALCIUM 9.5 03/14/2017 0921   GFRNONAA 8 (L) 05/05/2019 1418   GFRNONAA 5 (L) 09/17/2017 0954   GFRNONAA 24 (L) 06/30/2014 1528   GFRAA 10 (L) 05/05/2019 1418   GFRAA 6 (L) 09/17/2017 0954   GFRAA 28 (L) 06/30/2014 1528    INR    Component Value Date/Time   INR 1.2 04/29/2019 0713   INR 1.10 (L) 01/21/2015 0809     Intake/Output Summary (Last 24 hours) at 05/07/2019 1125 Last data filed at 05/07/2019 0843 Gross per 24 hour  Intake 820 ml  Output 50 ml  Net 770 ml     Assessment/Plan:  69 y.o. male is s/p right below knee amputation Day of Surgery. Doing well post op. Clean dry and Intact dressings. Order placed for knee immobilizer. HD scheduled for today if he will tolerate as he skipped yesterday. PT eval tomorrow. Continue Pain control and routine post op care  DVT prophylaxis:  SQ Heparin   Karoline Caldwell, PA-C [facility] [phone] 05/07/2019 11:25 AM

## 2019-05-07 NOTE — Plan of Care (Signed)
  Problem: Clinical Measurements: Goal: Respiratory complications will improve Outcome: Progressing Goal: Cardiovascular complication will be avoided Outcome: Progressing   Problem: Health Behavior/Discharge Planning: Goal: Ability to manage health-related needs will improve Outcome: Not Progressing   Problem: Activity: Goal: Risk for activity intolerance will decrease Outcome: Not Progressing

## 2019-05-07 NOTE — Op Note (Signed)
    Patient name: Jose Garrett MRN: [id number] DOB: 19-Jun-1950 Sex: male  05/07/2019 Pre-operative Diagnosis: Nonhealing right transmetatarsal amputation Post-operative diagnosis:  Same Surgeon:  Annamarie Major Assistants: Paul Half Procedure:   Right below-knee amputation Anesthesia: General Blood Loss: 100 cc Specimens: Right leg  Findings: Healthy appearing tissue which appears well-perfused  Indications: The patient has previously undergone right femoral-popliteal bypass graft.  He recently underwent transmetatarsal amputation which is nonhealing.  He consented for a below-knee amputation.  Procedure:  The patient was identified in the holding area and taken to [place]  The patient was then placed supine on the table. general anesthesia was administered.  The patient was prepped and draped in the usual sterile fashion.  A time out was called and antibiotics were administered.  A circumferential measurement of the calf was made just below his previous below-knee incision.  I then created a two thirds one third posterior flap incision.  Cautery was used to biopsy the subcutaneous tissue and muscle.  The tibia was circumferentially exposed as well as the fibula.  A periosteal elevator was used to elevate the periosteum.  A Gigli saw was used to transect the tibia, beveling the anterior surface.  Bone cutters were then used to transect the fibula proximal to the cut edge of the tibia.  I then identified the neurovascular bundle and divided this between silk ties.  Cautery was used to divide the remaining muscle and the leg was removed and sent as a specimen.  I then isolated the nerve artery and vein within the neurovascular bundle and dissected these out proximally and ligated them individually, proximal to the cut edge of the tibia.  The wound was then copiously irrigated.  A rasp was used to smooth the bone surface edges.  The fascia was then reapproximated with interrupted 2-0 Vicryl and  the skin was closed with staples.  Sterile dressings were applied.  There were no immediate complications.   Disposition: To PACU stable.   Theotis Burrow, M.D., [facility] [place] Office: [phone] Pager:  [phone]

## 2019-05-07 NOTE — H&P (Signed)
    Subjective  -  Ready for surgery   Physical Exam:  Non-healing right TMA       Assessment/Plan:    Discussed proceeding with right BKA.  All questions answered.  Wells Malachy Coleman 05/07/2019 7:30 AM --  Vitals:   05/07/19 0005 05/07/19 0450  BP: 110/74 113/73  Pulse: 82 69  Resp: 17 16  Temp: 99 F (37.2 C) 99 F (37.2 C)  SpO2: 100% 100%    Intake/Output Summary (Last 24 hours) at 05/07/2019 0730 Last data filed at 05/06/2019 2100 Gross per 24 hour  Intake 120 ml  Output --  Net 120 ml     Laboratory CBC    Component Value Date/Time   WBC 9.7 05/05/2019 0745   HGB 9.5 (L) 05/07/2019 0705   HGB 11.4 (L) 11/12/2018 1637   HGB 10.8 (L) 03/14/2017 0921   HCT 28.0 (L) 05/07/2019 0705   HCT 35.0 (L) 11/12/2018 1637   HCT 32.5 (L) 03/14/2017 0921   PLT 145 (L) 05/05/2019 0745   PLT 51 (LL) 11/12/2018 1637    BMET    Component Value Date/Time   NA 134 (L) 05/07/2019 0705   NA 135 07/14/2018 0909   NA 141 03/14/2017 0921   K 5.9 (H) 05/07/2019 0705   K 4.1 03/14/2017 0921   CL 96 (L) 05/07/2019 0705   CO2 25 05/05/2019 1418   CO2 33 (H) 03/14/2017 0921   GLUCOSE 113 (H) 05/07/2019 0705   GLUCOSE 144 (H) 03/14/2017 0921   BUN 85 (H) 05/07/2019 0705   BUN 76 (HH) 07/14/2018 0909   BUN 30.1 (H) 03/14/2017 0921   CREATININE 10.60 (H) 05/07/2019 0705   CREATININE 9.16 (HH) 09/17/2017 0954   CREATININE 8.1 (HH) 03/14/2017 0921   CALCIUM 8.7 (L) 05/05/2019 1418   CALCIUM 9.5 03/14/2017 0921   GFRNONAA 8 (L) 05/05/2019 1418   GFRNONAA 5 (L) 09/17/2017 0954   GFRNONAA 24 (L) 06/30/2014 1528   GFRAA 10 (L) 05/05/2019 1418   GFRAA 6 (L) 09/17/2017 0954   GFRAA 28 (L) 06/30/2014 1528    COAG Lab Results  Component Value Date   INR 1.2 04/29/2019   INR 1.3 (H) 10/16/2018   INR 1.2 07/22/2018   PROTIME 13.2 01/21/2015   Lab Results  Component Value Date   PTT 48 (H) 10/11/2014    Antibiotics Anti-infectives (From admission, onward)    Start     Dose/Rate Route Frequency Ordered Stop   05/05/19 1547  ceFAZolin (ANCEF) 2-4 GM/100ML-% IVPB    Note to Pharmacy: Marga Melnick   : cabinet override      05/05/19 1547 05/05/19 1600   04/29/19 1115  ceFAZolin (ANCEF) IVPB 2g/100 mL premix  Status:  Discontinued     2 g 200 mL/hr over 30 Minutes Intravenous Every 8 hours 04/29/19 1110 04/29/19 1128   04/29/19 0636  ceFAZolin (ANCEF) IVPB 2g/100 mL premix     2 g 200 mL/hr over 30 Minutes Intravenous 30 min pre-op 04/29/19 0636 04/29/19 0828       V. Leia Alf, M.D., [facility] [place] Office: [phone] Pager:  [phone]

## 2019-05-07 NOTE — Progress Notes (Signed)
  [facility] Progress Note   Subjective:  Seen in room - just getting back from amputation this morning. He is tired and in pain. Skipped HD yesterday and now K is high - will dialyze today, will plan to give him an hour of so to see if can get some pain control first. He denies CP or dyspnea.   Objective Vitals:   05/07/19 0450 05/07/19 0900 05/07/19 0930 05/07/19 0933  BP: 113/73 (!) 141/59  129/81  Pulse: 69 72  71  Resp: 16 (!) 24  16  Temp: 99 F (37.2 C) 98 F (36.7 C) 97.6 F (36.4 C) 97.8 F (36.6 C)  TempSrc: Oral   Oral  SpO2: 100% 100%    Weight:      Height:       Physical Exam General:Lethargic appearing, clearly with pain. NAD. Heart:RRR; no murmur Lungs:CTA anteriorly Abdomen:soft Extremities:R BKA (bandaged). no LLE edema. Dialysis Access:R forearm AVF + thrill  Additional Objective Labs: Basic Metabolic Panel: Recent Labs  Lab 05/01/19 0222 05/05/19 1418 05/07/19 0705  NA 137 137 134*  K 5.1 4.2 5.9*  CL 92* 94* 96*  CO2 26 25  --   GLUCOSE 104* 105* 113*  BUN 42* 39* 85*  CREATININE 8.04* 6.38* 10.60*  CALCIUM 8.4* 8.7*  --    CBC: Recent Labs  Lab 05/01/19 0222 05/05/19 0745 05/07/19 0705  WBC 11.8* 9.7  --   HGB 8.3* 8.9* 9.5*  HCT 26.6* 28.3* 28.0*  MCV 106.4* 105.2*  --   PLT 105* 145*  --    Medications: . sodium chloride    . sodium chloride    . magnesium sulfate bolus IVPB     . amiodarone  200 mg Oral Daily  . atorvastatin  80 mg Oral Q supper  . carvedilol  3.125 mg Oral BID  . Chlorhexidine Gluconate Cloth  6 each Topical Q0600  . Chlorhexidine Gluconate Cloth  6 each Topical Q0600  . clopidogrel  75 mg Oral Q supper  . darbepoetin (ARANESP) injection - DIALYSIS  100 mcg Intravenous Q Fri-HD  . docusate sodium  100 mg Oral Daily  . heparin  5,000 Units Subcutaneous Q8H  . midodrine  10 mg Oral Q M,W,F-HD  . multivitamin  1 tablet Oral QHS  . pantoprazole  40 mg Oral Daily  . sodium chloride  flush  3 mL Intravenous Q12H  . sucroferric oxyhydroxide  1,250 mg Oral TID WC  . vitamin B-12  1,000 mcg Oral QPM    Dialysis Orders: MWF at [facility] 4hr, 400/800, EDW 76.5kg, 2K/2Ca, UFP #4, AVF, heparin 2400 - Parsabiv 5mg  IV q HD - Calcitriol 1.53mcg PO q HD - Mircera 38mcg IV q 2 weeks (last given 42mcg on 1/20, Hgb 10.3 on 2/10)  Assessment/Plan: 1. PAD with gangrenous changes: S/pR TMA on 2/17 with Dr. Donzetta Matters -> now with poor healing, s/p R BKA 2/25. 2.  ESRD: HD MWF. Refused HD 2/24 - now with hyperkalemia -> HD today. Midodrine 10 mg pre-HD. 3. Hypertension/volume:BP controlled, no edema, below EDW. Min UF w/ HD.  4. Anemia:Hgb9.5 today - anticipate post-op drop - Aranesp q Friday. 5. Metabolic bone disease:Ca ok,Phos pending. Continuevelphoro as binder 6. Hx TAVR 7. CAD/HFrEF  Veneta Penton, PA-C 05/07/2019, 10:53 AM  [facility]

## 2019-05-07 NOTE — Anesthesia Postprocedure Evaluation (Signed)
Anesthesia Post Note  Patient: Jose Garrett  Procedure(s) Performed: AMPUTATION BELOW KNEE (Right Knee)     Patient location during evaluation: PACU Anesthesia Type: General Level of consciousness: awake and alert Pain management: pain level controlled Vital Signs Assessment: post-procedure vital signs reviewed and stable Respiratory status: spontaneous breathing, nonlabored ventilation, respiratory function stable and patient connected to nasal cannula oxygen Cardiovascular status: blood pressure returned to baseline and stable Postop Assessment: no apparent nausea or vomiting Anesthetic complications: no    Last Vitals:  Vitals:   05/07/19 1500 05/07/19 1530  BP: (!) 115/56 (!) 107/54  Pulse: 67 65  Resp:    Temp:    SpO2:      Last Pain:  Vitals:   05/07/19 1434  TempSrc:   PainSc: 8                  Lezley Bedgood P Ansley Stanwood

## 2019-05-07 NOTE — Transfer of Care (Signed)
Immediate Anesthesia Transfer of Care Note  Patient: Jose Garrett  Procedure(s) Performed: AMPUTATION BELOW KNEE (Right Knee)  Patient Location: PACU  Anesthesia Type:General  Level of Consciousness: awake and alert   Airway & Oxygen Therapy: Patient Spontanous Breathing and Patient connected to face mask oxygen  Post-op Assessment: Report given to RN and Post -op Vital signs reviewed and stable  Post vital signs: Reviewed and stable  Last Vitals:  Vitals Value Taken Time  BP 141/59 05/07/19 0900  Temp    Pulse 72 05/07/19 0901  Resp 18 05/07/19 0901  SpO2 98 % 05/07/19 0901  Vitals shown include unvalidated device data.  Last Pain:  Vitals:   05/07/19 0540  TempSrc:   PainSc: Asleep      Patients Stated Pain Goal: 2 (81/85/63 1497)  Complications: No apparent anesthesia complications

## 2019-05-08 LAB — RENAL FUNCTION PANEL
Albumin: 2.1 g/dL — ABNORMAL LOW (ref 3.5–5.0)
Anion gap: 18 — ABNORMAL HIGH (ref 5–15)
BUN: 56 mg/dL — ABNORMAL HIGH (ref 8–23)
CO2: 24 mmol/L (ref 22–32)
Calcium: 8.6 mg/dL — ABNORMAL LOW (ref 8.9–10.3)
Chloride: 92 mmol/L — ABNORMAL LOW (ref 98–111)
Creatinine, Ser: 6.59 mg/dL — ABNORMAL HIGH (ref 0.61–1.24)
GFR calc Af Amer: 9 mL/min — ABNORMAL LOW (ref >60)
GFR calc non Af Amer: 8 mL/min — ABNORMAL LOW (ref >60)
Glucose, Bld: 118 mg/dL — ABNORMAL HIGH (ref 70–99)
Phosphorus: 6.7 mg/dL — ABNORMAL HIGH (ref 2.5–4.6)
Potassium: 3.7 mmol/L (ref 3.5–5.1)
Sodium: 134 mmol/L — ABNORMAL LOW (ref 135–145)

## 2019-05-08 LAB — GLUCOSE, CAPILLARY: Glucose-Capillary: 86 mg/dL (ref 70–99)

## 2019-05-08 MED ORDER — DARBEPOETIN ALFA 100 MCG/0.5ML IJ SOSY
PREFILLED_SYRINGE | INTRAMUSCULAR | Status: AC
  Filled 2019-05-08: qty 0.5

## 2019-05-08 NOTE — Evaluation (Signed)
Physical Therapy Evaluation Patient Details Name: Jose Garrett MRN: [id number] DOB: Nov 06, 1950 Today's Date: 05/08/2019   History of Present Illness  Pt is a 69 y.o. male with recent hx of R LE bypass (04/10/19), now admitted 04/29/19 with progressive gangrenous changes of toes and dorsum of foot. S/p R transmet amputation 2/17. PMH includes aortic stenosis, HTN, HLD, CHF, CAD, PD, anxiety, ESRD (HD MWF). 2-25 pt had BKA.    Clinical Impression  Pt got OOB into the chair.  His left leg is weak and he wasn't strong enough to stand and turn to recliner using RW today.  He was stand pivot transfer to left with max to mod assist.  Pt reports his right leg was his strongest leg.  Pt given knee press exercise to do - to help with knee extension.  Pt needs skilled PT to help improve functional status.  He wants to go to CIR and then home. He realizes that if this isnt possible he will be SNF level therapy - depending on his progress with acute therapy.    Follow Up Recommendations CIR;Supervision/Assistance - 24 hour    Equipment Recommendations  None recommended by PT    Recommendations for Other Services       Precautions / Restrictions Precautions Precautions: Fall Required Braces or Orthoses: Knee Immobilizer - Right Knee Immobilizer - Right: On at all times Restrictions Weight Bearing Restrictions: Yes RLE Weight Bearing: Non weight bearing      Mobility  Bed Mobility Overal bed mobility: Needs Assistance Bed Mobility: Supine to Sit;Sit to Supine Rolling: Mod assist   Supine to sit: Mod assist;Min assist     General bed mobility comments: increasd time and effort, use of rail, and HOB elevated.  pt needed verbal cues for technque.  pt with jerking -d ue to pain in right leg - wanting to throw his trunk backwards when this happens  Transfers Overall transfer level: Needs assistance Equipment used: Rolling walker (2 wheeled) Transfers: Sit to/from [employer] Sit to Stand: Mod assist;+2 physical assistance;From elevated surface Stand pivot transfers: Max assist;+2 physical assistance;From elevated surface       General transfer comment: pt unable to bear weight through left leg with RW to be able to turn and sit in recliner.  He said left leg too tired too fast.  We recommend pt use Steady wtih nursing to get back to bed.  Ambulation/Gait             General Gait Details: pt unable to bear weight on arms and left leg enough to ambulate today.  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[employer] (Stroke Patients Only)       Balance Overall balance assessment: Needs assistance     Sitting balance - Comments: pt leaning posterior and would have shooting pains in right leg that would make him lean posteriro quickly       Standing balance comment: pt unable to stand safely with RW - he reports left leg weak                             Pertinent Vitals/Pain Pain Assessment: 0-10 Pain Score: 8  Pain Location: RLE Pain Descriptors / Indicators: Sore;Grimacing;Guarding;Jabbing;Throbbing Pain Intervention(s): Repositioned;Monitored during session    Home Living Family/patient expects to be discharged to:: Inpatient rehab Living Arrangements: Non-relatives/Friends Available Help at  Discharge: Friend(s);Available 24 hours/day Type of Home: House         Home Equipment: Kasandra Knudsen - single point;Walker - 2 wheels;Bedside commode;Wheelchair - manual      Prior Function           Comments: pt reports he was living alone with one step.  Original eval said living at friends home.     Hand Dominance        Extremity/Trunk Assessment   Upper Extremity Assessment Upper Extremity Assessment: Defer to OT evaluation    Lower Extremity Assessment Lower Extremity Assessment: LLE deficits/detail RLE Deficits / Details: pts right leg in KI - pt complaining about not being able to  keep it straight.  pt wtih 20 degrees knee flexion RLE: Unable to fully assess due to pain LLE Deficits / Details: left leg weak - pt 3-/5 quad.  Pt reports right leg was his strong leg    Cervical / Trunk Assessment Cervical / Trunk Assessment: Normal  Communication   Communication: No difficulties  Cognition Arousal/Alertness: Awake/alert Behavior During Therapy: WFL for tasks assessed/performed Overall Cognitive Status: Within Functional Limits for tasks assessed                           Safety/Judgement: Decreased awareness of safety     General Comments: WFL for simple mobility tasks      General Comments      Exercises Total Joint Exercises Quad Sets: AROM;10 reps;Seated;Both   Assessment/Plan    PT Assessment Patient needs continued PT services  PT Problem List Decreased strength;Decreased mobility;Pain;Impaired sensation;Decreased balance;Decreased activity tolerance;Decreased skin integrity;Decreased knowledge of precautions;Decreased safety awareness;Decreased knowledge of use of DME       PT Treatment Interventions Therapeutic activities;Gait training;Therapeutic exercise;Patient/family education;Balance training;Functional mobility training;Stair training;DME instruction;Wheelchair mobility training    PT Goals (Current goals can be found in the Care Plan section)  Acute Rehab PT Goals Patient Stated Goal: Pt wanting to go to CIR for therapy and then hoping to go home PT Goal Formulation: With patient Time For Goal Achievement: 05/22/19 Potential to Achieve Goals: Good    Frequency Min 3X/week   Barriers to discharge Decreased caregiver support he reports he lives alone    Co-evaluation PT/OT/SLP Co-Evaluation/Treatment: Yes Reason for Co-Treatment: For patient/therapist safety;To address functional/ADL transfers PT goals addressed during session: Mobility/safety with mobility;Balance;Proper use of DME         AM-PAC PT "6 Clicks"  Mobility  Outcome Measure Help needed turning from your back to your side while in a flat bed without using bedrails?: A Little Help needed moving from lying on your back to sitting on the side of a flat bed without using bedrails?: A Little Help needed moving to and from a bed to a chair (including a wheelchair)?: A Lot Help needed standing up from a chair using your arms (e.g., wheelchair or bedside chair)?: A Lot Help needed to walk in hospital room?: Total Help needed climbing 3-5 steps with a railing? : Total 6 Click Score: 12    End of Session Equipment Utilized During Treatment: Gait belt;Right knee immobilizer Activity Tolerance: Patient limited by fatigue Patient left: in chair;with chair alarm set;with call bell/phone within reach Nurse Communication: Need for lift equipment;Weight bearing status;Precautions;Mobility status PT Visit Diagnosis: Other abnormalities of gait and mobility (R26.89);Muscle weakness (generalized) (M62.81);Difficulty in walking, not elsewhere classified (R26.2);Pain Pain - Right/Left: Right Pain - part of body: Leg    Time: 1010-1040  PT Time Calculation (min) (ACUTE ONLY): 30 min   Charges:   PT Evaluation $PT Eval Low Complexity: 1 Low         05/08/2019   Rande Lawman, PT   Loyal Buba 05/08/2019, 12:50 PM

## 2019-05-08 NOTE — Plan of Care (Signed)
  Problem: Clinical Measurements: Goal: Respiratory complications will improve Outcome: Progressing   Problem: Health Behavior/Discharge Planning: Goal: Ability to manage health-related needs will improve Outcome: Not Progressing   

## 2019-05-08 NOTE — Progress Notes (Signed)
[facility] Progress Note   Subjective: Up in chair, initially refusing to go to HD but decided he will go. No specific complaints.    Objective Vitals:   05/07/19 1930 05/08/19 0523 05/08/19 0743 05/08/19 0856  BP: (!) 141/60 (!) 127/55 (!) 128/57 (!) 141/63  Pulse: 65 71 65 65  Resp: 15 19 16    Temp: 98 F (36.7 C) 98.5 F (36.9 C) 98.1 F (36.7 C)   TempSrc: Oral Oral Oral   SpO2: 97% 97% 94%   Weight:      Height:       Physical Exam General: Pleasant older gentleman Heart: SR on monitor S1,S2 No M/R/G Lungs: CTAB Abdomen: S, NT Extremities: R BKA in stump protector. No LLE edema Dialysis Access: R AVF strong bruit   Additional Objective Labs: Basic Metabolic Panel: Recent Labs  Lab 05/05/19 1418 05/07/19 0705  NA 137 134*  K 4.2 5.9*  CL 94* 96*  CO2 25  --   GLUCOSE 105* 113*  BUN 39* 85*  CREATININE 6.38* 10.60*  CALCIUM 8.7*  --    Liver Function Tests: No results for input(s): AST, ALT, ALKPHOS, BILITOT, PROT, ALBUMIN in the last 168 hours. No results for input(s): LIPASE, AMYLASE in the last 168 hours. CBC: Recent Labs  Lab 05/05/19 0745 05/07/19 0705  WBC 9.7  --   HGB 8.9* 9.5*  HCT 28.3* 28.0*  MCV 105.2*  --   PLT 145*  --    Blood Culture    Component Value Date/Time   SDES URINE, RANDOM 02/01/2016 1707   SPECREQUEST NONE 02/01/2016 1707   CULT (A) 02/01/2016 1707    10,000 COLONIES/mL PSEUDOMONAS AERUGINOSA 40,000 COLONIES/mL ENTEROBACTER AEROGENES    REPTSTATUS 02/04/2016 FINAL 02/01/2016 1707    Cardiac Enzymes: No results for input(s): CKTOTAL, CKMB, CKMBINDEX, TROPONINI in the last 168 hours. CBG: Recent Labs  Lab 05/06/19 1724  GLUCAP 93   Iron Studies: No results for input(s): IRON, TIBC, TRANSFERRIN, FERRITIN in the last 72 hours. @lablastinr3 @ Studies/Results: No results found. Medications: . sodium chloride    . sodium chloride    . magnesium sulfate bolus IVPB     . amiodarone  200 mg  Oral Daily  . atorvastatin  80 mg Oral Q supper  . carvedilol  3.125 mg Oral BID  . Chlorhexidine Gluconate Cloth  6 each Topical Q0600  . clopidogrel  75 mg Oral Q supper  . darbepoetin (ARANESP) injection - DIALYSIS  100 mcg Intravenous Q Fri-HD  . docusate sodium  100 mg Oral Daily  . heparin  5,000 Units Subcutaneous Q8H  . midodrine  10 mg Oral Q M,W,F-HD  . multivitamin  1 tablet Oral QHS  . pantoprazole  40 mg Oral Daily  . sodium chloride flush  3 mL Intravenous Q12H  . sucroferric oxyhydroxide  1,250 mg Oral TID WC  . vitamin B-12  1,000 mcg Oral QPM     Dialysis Orders: MWF at [facility] 4hr, 400/800, EDW 76.5kg, 2K/2Ca, UFP #4, AVF,  -Heparin 2400 units IV TIW - Parsabiv 5mg  IV q HD - Calcitriol 1.98mcg PO q HD - Mircera 76mcg IV q 2 weeks (last given 65mcg on 1/20, Hgb 10.3 on 2/10)  Assessment/Plan: 1. PAD with gangrenous changes: S/pR TMA on 2/17 with Dr. Donzetta Matters -> now with poor healing,s/p R BKA 2/25. 2. ESRD: HDMWF. Refused HD 2/24 - admitted with hyperkalemia K+ 5.9 02/25. HD 02/25 off schedule,  HD again today to get back on  schedule. Check labs in HD 3. Hypertension/volume:BP controlled, no edema,below EDW. Min UF w/ HD. 4. Anemia:Hgb9.5 today - anticipate post-op drop - Aranesp q Friday. 5. Metabolic bone disease:Ca ok,Phos pending. Continuevelphoro as binder 6. Hx TAVR 7. CAD/HFrEF  Jimmye Norman. Vikash Nest NP-C 05/08/2019, 12:12 PM  [employer] [phone]

## 2019-05-08 NOTE — Progress Notes (Addendum)
  Progress Note    05/08/2019 7:25 AM 1 Day Post-Op  Subjective:  Pain is tolerable  Vitals:   05/07/19 1930 05/08/19 0523  BP: (!) 141/60 (!) 127/55  Pulse: 65 71  Resp: 15 19  Temp: 98 F (36.7 C) 98.5 F (36.9 C)  SpO2: 97% 97%    Physical Exam: Incisions:  R BKA incision with dressing in place   CBC    Component Value Date/Time   WBC 9.7 05/05/2019 0745   RBC 2.69 (L) 05/05/2019 0745   HGB 9.5 (L) 05/07/2019 0705   HGB 11.4 (L) 11/12/2018 1637   HGB 10.8 (L) 03/14/2017 0921   HCT 28.0 (L) 05/07/2019 0705   HCT 35.0 (L) 11/12/2018 1637   HCT 32.5 (L) 03/14/2017 0921   PLT 145 (L) 05/05/2019 0745   PLT 51 (LL) 11/12/2018 1637   MCV 105.2 (H) 05/05/2019 0745   MCV 103 (H) 11/12/2018 1637   MCV 106.2 (H) 03/14/2017 0921   MCH 33.1 05/05/2019 0745   MCHC 31.4 05/05/2019 0745   RDW 13.8 05/05/2019 0745   RDW 14.6 11/12/2018 1637   RDW 14.6 03/14/2017 0921   LYMPHSABS 1.0 11/12/2018 1637   LYMPHSABS 0.8 (L) 03/14/2017 0921   MONOABS 0.4 10/20/2018 0638   MONOABS 0.6 03/14/2017 0921   EOSABS 0.2 11/12/2018 1637   BASOSABS 0.0 11/12/2018 1637   BASOSABS 0.0 03/14/2017 0921    BMET    Component Value Date/Time   NA 134 (L) 05/07/2019 0705   NA 135 07/14/2018 0909   NA 141 03/14/2017 0921   K 5.9 (H) 05/07/2019 0705   K 4.1 03/14/2017 0921   CL 96 (L) 05/07/2019 0705   CO2 25 05/05/2019 1418   CO2 33 (H) 03/14/2017 0921   GLUCOSE 113 (H) 05/07/2019 0705   GLUCOSE 144 (H) 03/14/2017 0921   BUN 85 (H) 05/07/2019 0705   BUN 76 (HH) 07/14/2018 0909   BUN 30.1 (H) 03/14/2017 0921   CREATININE 10.60 (H) 05/07/2019 0705   CREATININE 9.16 (HH) 09/17/2017 0954   CREATININE 8.1 (HH) 03/14/2017 0921   CALCIUM 8.7 (L) 05/05/2019 1418   CALCIUM 9.5 03/14/2017 0921   GFRNONAA 8 (L) 05/05/2019 1418   GFRNONAA 5 (L) 09/17/2017 0954   GFRNONAA 24 (L) 06/30/2014 1528   GFRAA 10 (L) 05/05/2019 1418   GFRAA 6 (L) 09/17/2017 0954   GFRAA 28 (L) 06/30/2014 1528     INR    Component Value Date/Time   INR 1.2 04/29/2019 0713   INR 1.10 (L) 01/21/2015 0809     Intake/Output Summary (Last 24 hours) at 05/08/2019 0725 Last data filed at 05/07/2019 2154 Gross per 24 hour  Intake 700 ml  Output 50 ml  Net 650 ml     Assessment/Plan:  69 y.o. male is s/p right below knee amputation  1 Day Post-Op  - dressing change tomorrow - pain medicine on schedule today - PT/OT eval - ESRD on HD per Nephrology   Dagoberto Ligas, PA-C [facility] [phone] 05/08/2019 7:25 AM   I agree with the above.  Plan for dressing change tomorrow.  Annamarie Major

## 2019-05-08 NOTE — Evaluation (Signed)
Occupational Therapy Re-Evaluation Patient Details Name: Jose Garrett MRN: [id number] DOB: 1950-11-23 Today's Date: 05/08/2019    History of Present Illness Pt is a 69 y.o. male with recent hx of R LE bypass (04/10/19), now admitted 04/29/19 with progressive gangrenous changes of toes and dorsum of foot. S/p R transmet amputation 2/17. S/p R BKA 2/25. PMH includes aortic stenosis, HTN, HLD, CHF, CAD, PD, anxiety, ESRD (HD MWF). 2-25 pt had BKA.   Clinical Impression   Patient had been progressing well with therapy however underwent R BKA on 05/07/2019 requiring extensive assistance for functional transfers at this time. Attempt standing with rolling walker requiring mod A x2 to maintain static balance and max difficulty maintaining weight bearing through L LE. Patient require seated rest break and stand pivot transfer to recliner with second assist for safety with equipment. Notified nursing assistant to use stedy for return to bed.   Patient expresses wishes to go to CIR for continued rehab "I went there before," and is very appreciative/motivated with therapy "thank you for working with me today." If patient is unable to go to CIR will need to go to SNF for further ADL retraining, strengthening, transfer training.     Follow Up Recommendations  CIR;SNF;Supervision/Assistance - 24 hour;Other (comment)(pt motivated/wants CIR, if not able will need to go to SNF )    Equipment Recommendations  Other (comment)(defer to next venue)    Recommendations for Other Services Rehab consult     Precautions / Restrictions Precautions Precautions: Fall Required Braces or Orthoses: Knee Immobilizer - Right Knee Immobilizer - Right: On at all times Restrictions Weight Bearing Restrictions: Yes RLE Weight Bearing: Non weight bearing      Mobility Bed Mobility Overal bed mobility: Needs Assistance Bed Mobility: Supine to Sit Rolling: Mod assist   Supine to sit: Min assist;Mod assist      General bed mobility comments: increased time and effort with use of bed rail, requires assist to weight shift off R forearm and elevate trunk to sitting. increased time for scooting to edge of bed due to pain with intermittent posterior leaning  Transfers Overall transfer level: Needs assistance Equipment used: Rolling walker (2 wheeled) Transfers: Sit to/from [facility] Sit to Stand: Mod assist;+2 physical assistance;From elevated surface Stand pivot transfers: Mod assist;+2 safety/equipment       General transfer comment: patient unable to maintain weight through L LE with mod A x2 and rolling walker with kyphotic posture therefore stand pivot to chair with second assist for safety with equipment    Balance Overall balance assessment: Needs assistance Sitting-balance support: No upper extremity supported;Feet supported Sitting balance-Leahy Scale: Fair Sitting balance - Comments: pt leaning posterior and would have shooting pains in right leg that would make him lean posteriro quickly   Standing balance support: Bilateral upper extremity supported;During functional activity Standing balance-Leahy Scale: Zero Standing balance comment: unable to maintain without external support of walker and mod to max A x2                           ADL either performed or assessed with clinical judgement   ADL Overall ADL's : Needs assistance/impaired Eating/Feeding: Independent   Grooming: Set up;Sitting Grooming Details (indicate cue type and reason): intermittent min guard initially progressing to supervision for balance Upper Body Bathing: Set up;Sitting   Lower Body Bathing: Moderate assistance;Maximal assistance;Sitting/lateral leans;Sit to/from stand Lower Body Bathing Details (indicate cue type and reason): due to  difficulty standing s/p R BKA Upper Body Dressing : Set up;Sitting   Lower Body Dressing: Moderate assistance;Maximal assistance;Sitting/lateral  leans;Sit to/from stand   Toilet Transfer: Moderate assistance;+2 for safety/equipment;+2 for physical assistance;Stand-pivot;Cueing for safety;BSC Toilet Transfer Details (indicate cue type and reason): simulated to recliner, difficulty maintaining weight through L LE with walker in standing requiring pivot transfer                 Vision Baseline Vision/History: Wears glasses Wears Glasses: At all times              Pertinent Vitals/Pain Pain Assessment: Faces Pain Score: 8  Faces Pain Scale: Hurts even more Pain Location: RLE Pain Descriptors / Indicators: Sore;Grimacing;Guarding;Jabbing;Throbbing Pain Intervention(s): Limited activity within patient's tolerance     Hand Dominance Right   Extremity/Trunk Assessment Upper Extremity Assessment Upper Extremity Assessment: Generalized weakness   Lower Extremity Assessment Lower Extremity Assessment: Defer to PT evaluation RLE Deficits / Details: pts right leg in KI - pt complaining about not being able to keep it straight.  pt wtih 20 degrees knee flexion RLE: Unable to fully assess due to pain LLE Deficits / Details: left leg weak - pt 3-/5 quad.  Pt reports right leg was his strong leg   Cervical / Trunk Assessment Cervical / Trunk Assessment: Normal   Communication Communication Communication: No difficulties   Cognition Arousal/Alertness: Awake/alert Behavior During Therapy: WFL for tasks assessed/performed Overall Cognitive Status: No family/caregiver present to determine baseline cognitive functioning                           Safety/Judgement: Decreased awareness of safety     General Comments: patient able to follow directions during assessment however his story regarding home set up vs original eval vary      Exercises Total Joint Exercises Quad Sets: AROM;10 reps;Seated;Both        Home Living Family/patient expects to be discharged to:: Inpatient rehab Living Arrangements:  Non-relatives/Friends Available Help at Discharge: Friend(s);Available 24 hours/day Type of Home: House Home Access: Stairs to enter;Ramped entrance Entrance Stairs-Number of Steps: 1(6-8 inches)   Home Layout: One level               Home Equipment: Cane - single point;Walker - 2 wheels;Bedside commode;Wheelchair - manual   Additional Comments: when asked today if patient lives with anyone states he lives alone but that his daughter and ex-wife would help him after discharge      Prior Functioning/Environment Level of Independence: Independent with assistive device(s)        Comments: pt reports he was living alone with one step.  Original eval said living at friends home.        OT Problem List: Decreased strength;Decreased activity tolerance;Impaired balance (sitting and/or standing);Pain;Decreased knowledge of use of DME or AE;Decreased knowledge of precautions;Decreased safety awareness      OT Treatment/Interventions: Self-care/ADL training;Therapeutic exercise;Energy conservation;DME and/or AE instruction;Balance training;Patient/family education;Therapeutic activities    OT Goals(Current goals can be found in the care plan section) Acute Rehab OT Goals Patient Stated Goal: Pt wanting to go to CIR for therapy and then hoping to go home OT Goal Formulation: With patient Time For Goal Achievement: 05/22/19 Potential to Achieve Goals: Good  OT Frequency: Min 2X/week           Co-evaluation PT/OT/SLP Co-Evaluation/Treatment: Yes Reason for Co-Treatment: Complexity of the patient's impairments (multi-system involvement);For patient/therapist safety;To address functional/ADL transfers PT goals addressed  during session: Mobility/safety with mobility;Balance;Proper use of DME OT goals addressed during session: ADL's and self-care      AM-PAC OT "6 Clicks" Daily Activity     Outcome Measure Help from another person eating meals?: None Help from another person  taking care of personal grooming?: A Little Help from another person toileting, which includes using toliet, bedpan, or urinal?: Total Help from another person bathing (including washing, rinsing, drying)?: A Lot Help from another person to put on and taking off regular upper body clothing?: A Little Help from another person to put on and taking off regular lower body clothing?: A Lot 6 Click Score: 15   End of Session Equipment Utilized During Treatment: Rolling walker;Gait belt Nurse Communication: Mobility status;Need for lift equipment  Activity Tolerance: Patient tolerated treatment well Patient left: in chair;with call bell/phone within reach;with chair alarm set  OT Visit Diagnosis: Other abnormalities of gait and mobility (R26.89);Muscle weakness (generalized) (M62.81);Pain Pain - Right/Left: Right Pain - part of body: Leg                Time: 1010-1045 OT Time Calculation (min): 35 min Charges:  OT General Charges $OT Visit: 1 Visit OT Evaluation $OT Re-eval: 1 Re-eval  Shon Millet OT OT office: [place] 05/08/2019, 2:41 PM

## 2019-05-08 NOTE — Progress Notes (Signed)
Orthopedic Tech Progress Note Patient Details:  ONIS MARKOFF 1951/02/24 [id number] Nurse called for knee immobilizer. Order not showing into ortho tech orders.  Ortho Devices Type of Ortho Device: Knee Immobilizer Ortho Device/Splint Location: RLE Ortho Device/Splint Interventions: Ordered, Application   Post Interventions Patient Tolerated: Well Instructions Provided: Adjustment of device, Care of device   Lyndsay Talamante N Romero Letizia 05/08/2019, 6:02 AM

## 2019-05-09 LAB — CBC
HCT: 24 % — ABNORMAL LOW (ref 39.0–52.0)
Hemoglobin: 7.5 g/dL — ABNORMAL LOW (ref 13.0–17.0)
MCH: 32.9 pg (ref 26.0–34.0)
MCHC: 31.3 g/dL (ref 30.0–36.0)
MCV: 105.3 fL — ABNORMAL HIGH (ref 80.0–100.0)
Platelets: 160 10*3/uL (ref 150–400)
RBC: 2.28 MIL/uL — ABNORMAL LOW (ref 4.22–5.81)
RDW: 13.9 % (ref 11.5–15.5)
WBC: 8.5 10*3/uL (ref 4.0–10.5)
nRBC: 0 % (ref 0.0–0.2)

## 2019-05-09 NOTE — Progress Notes (Signed)
Rehab Admissions Coordinator Note:  Per OT and PT recommendation, this patient was screened by Raechel Ache for appropriateness for an Inpatient Acute Rehab Consult.  At this time, we are recommending Inpatient Rehab consult. AC will contact MD to request order.   Raechel Ache 05/09/2019, 4:17 PM  I can be reached at [phone].

## 2019-05-09 NOTE — Progress Notes (Signed)
  [facility] Progress Note   Subjective:   Seen in room. No overnight dyspnea or CP. Leg pain manageable at the moment. No other concerns today.  Objective Vitals:   05/08/19 2200 05/08/19 2207 05/08/19 2226 05/09/19 0526  BP: 100/60 122/68 (!) 122/50 (!) 121/55  Pulse: 64 64 82 61  Resp: 16 16 13 15   Temp:  97.6 F (36.4 C) 98.2 F (36.8 C) 97.8 F (36.6 C)  TempSrc:  Axillary Oral Oral  SpO2:  97% 98% 91%  Weight:  72 kg  71.6 kg  Height:       Physical Exam General: Well appearing man, NAD Heart: RRR; no murmur Lungs: CTA anteriorly Abdomen: soft Extremities: R BKA in stump protector, no LLE edema Dialysis Access: R forearm AVF + thrill  Additional Objective Labs: Basic Metabolic Panel: Recent Labs  Lab 05/05/19 1418 05/07/19 0705 05/08/19 1847  NA 137 134* 134*  K 4.2 5.9* 3.7  CL 94* 96* 92*  CO2 25  --  24  GLUCOSE 105* 113* 118*  BUN 39* 85* 56*  CREATININE 6.38* 10.60* 6.59*  CALCIUM 8.7*  --  8.6*  PHOS  --   --  6.7*   Liver Function Tests: Recent Labs  Lab 05/08/19 1847  ALBUMIN 2.1*   CBC: Recent Labs  Lab 05/05/19 0745 05/07/19 0705 05/08/19 1847  WBC 9.7  --  8.5  HGB 8.9* 9.5* 7.5*  HCT 28.3* 28.0* 24.0*  MCV 105.2*  --  105.3*  PLT 145*  --  160   Medications: . sodium chloride    . sodium chloride    . magnesium sulfate bolus IVPB     . amiodarone  200 mg Oral Daily  . atorvastatin  80 mg Oral Q supper  . carvedilol  3.125 mg Oral BID  . Chlorhexidine Gluconate Cloth  6 each Topical Q0600  . clopidogrel  75 mg Oral Q supper  . darbepoetin (ARANESP) injection - DIALYSIS  100 mcg Intravenous Q Fri-HD  . docusate sodium  100 mg Oral Daily  . heparin  5,000 Units Subcutaneous Q8H  . midodrine  10 mg Oral Q M,W,F-HD  . multivitamin  1 tablet Oral QHS  . pantoprazole  40 mg Oral Daily  . sodium chloride flush  3 mL Intravenous Q12H  . sucroferric oxyhydroxide  1,250 mg Oral TID WC  . vitamin B-12  1,000 mcg  Oral QPM    Dialysis Orders: MWF at [facility] 4hr, 400/800, EDW 76.5kg, 2K/2Ca, UFP #4, AVF,  -Heparin 2400 units IV TIW - Parsabiv 5mg  IV q HD - Calcitriol 1.58mcg PO q HD - Mircera 59mcg IV q 2 weeks (last given 47mcg on 1/20, Hgb 10.3 on 2/10)  Assessment/Plan: 1. PAD with gangrenous changes: S/pR TMA on 2/17 -> poor healing ->s/p R BKA 2/25. VVS following. 2. ESRD: Back to MWF schedule - next Monday 3/1. 3. Hypertension/volume:BP controlled, no edema. EDW will be lowered on d/c. 4. Anemia:Hgbdown to 7.5 with post-op drop. Continue Aranesp q Friday. 5. Metabolic bone disease:Ca ok,Phos up slightly. Continuevelphoro as binder 6. Hx TAVR 7. CAD/HFrEF  Veneta Penton, PA-C 05/09/2019, 9:13 AM  [facility]

## 2019-05-09 NOTE — Progress Notes (Addendum)
Progress Note    05/09/2019 7:28 AM 2 Days Post-Op  Subjective:  Says he is doing "fair". Some muscle spasms in right leg over night   Vitals:   05/08/19 2226 05/09/19 0526  BP: (!) 122/50 (!) 121/55  Pulse: 82 61  Resp: 13 15  Temp: 98.2 F (36.8 C) 97.8 F (36.6 C)  SpO2: 98% 91%   Physical Exam: General: well nourished, appears comfortable Lungs:  Non labored Incisions:  Right below knee amputation stump clean ,dry and intact. No fluid collections or hematoma. Dressings reapplied Extremities:   Abdomen:  Soft, non tender Neurologic: Alert and oriented  CBC    Component Value Date/Time   WBC 8.5 05/08/2019 1847   RBC 2.28 (L) 05/08/2019 1847   HGB 7.5 (L) 05/08/2019 1847   HGB 11.4 (L) 11/12/2018 1637   HGB 10.8 (L) 03/14/2017 0921   HCT 24.0 (L) 05/08/2019 1847   HCT 35.0 (L) 11/12/2018 1637   HCT 32.5 (L) 03/14/2017 0921   PLT 160 05/08/2019 1847   PLT 51 (LL) 11/12/2018 1637   MCV 105.3 (H) 05/08/2019 1847   MCV 103 (H) 11/12/2018 1637   MCV 106.2 (H) 03/14/2017 0921   MCH 32.9 05/08/2019 1847   MCHC 31.3 05/08/2019 1847   RDW 13.9 05/08/2019 1847   RDW 14.6 11/12/2018 1637   RDW 14.6 03/14/2017 0921   LYMPHSABS 1.0 11/12/2018 1637   LYMPHSABS 0.8 (L) 03/14/2017 0921   MONOABS 0.4 10/20/2018 0638   MONOABS 0.6 03/14/2017 0921   EOSABS 0.2 11/12/2018 1637   BASOSABS 0.0 11/12/2018 1637   BASOSABS 0.0 03/14/2017 0921    BMET    Component Value Date/Time   NA 134 (L) 05/08/2019 1847   NA 135 07/14/2018 0909   NA 141 03/14/2017 0921   K 3.7 05/08/2019 1847   K 4.1 03/14/2017 0921   CL 92 (L) 05/08/2019 1847   CO2 24 05/08/2019 1847   CO2 33 (H) 03/14/2017 0921   GLUCOSE 118 (H) 05/08/2019 1847   GLUCOSE 144 (H) 03/14/2017 0921   BUN 56 (H) 05/08/2019 1847   BUN 76 (HH) 07/14/2018 0909   BUN 30.1 (H) 03/14/2017 0921   CREATININE 6.59 (H) 05/08/2019 1847   CREATININE 9.16 (HH) 09/17/2017 0954   CREATININE 8.1 (HH) 03/14/2017 0921   CALCIUM  8.6 (L) 05/08/2019 1847   CALCIUM 9.5 03/14/2017 0921   GFRNONAA 8 (L) 05/08/2019 1847   GFRNONAA 5 (L) 09/17/2017 0954   GFRNONAA 24 (L) 06/30/2014 1528   GFRAA 9 (L) 05/08/2019 1847   GFRAA 6 (L) 09/17/2017 0954   GFRAA 28 (L) 06/30/2014 1528    INR    Component Value Date/Time   INR 1.2 04/29/2019 0713   INR 1.10 (L) 01/21/2015 0809     Intake/Output Summary (Last 24 hours) at 05/09/2019 0728 Last data filed at 05/08/2019 2255 Gross per 24 hour  Intake 0 ml  Output 1000 ml  Net -1000 ml     Assessment/Plan:  69 y.o. male is s/p right below knee amputation 2 Days Post-Op. Stump is clean, dry and intact. Continue with knee immobilizer for now. Continue PT/OT. Continue HD per Nephrology. Acute blood loss anemia likely post op HGB at 7.5. Will continue to monitor. Pt wanting to go to CIR vs SNF   DVT prophylaxis:  SQ Heparin   Karoline Caldwell, PA-C [facility] [phone] 05/09/2019 7:28 AM   I have independently interviewed and examined patient and agree with PA assessment and  plan above.   Laurine Kuyper C. Donzetta Matters, MD [facility] [place] Office: [phone] Pager: [phone]

## 2019-05-09 NOTE — Plan of Care (Signed)

## 2019-05-10 NOTE — Progress Notes (Signed)
  [facility] Progress Note   Subjective:  Seen in room - no overnight c/o - says pain was tolerable. No CP/dyspnea.  Objective Vitals:   05/10/19 0303 05/10/19 0434 05/10/19 0621 05/10/19 0900  BP: 121/62   133/69  Pulse: (!) 59   (!) 58  Resp: 16  13 13   Temp: 97.9 F (36.6 C)   98.2 F (36.8 C)  TempSrc: Oral   Oral  SpO2: 97%   99%  Weight:  71.6 kg    Height:       Physical Exam General: Well appearing man, NAD Heart: RRR; no murmur Lungs: CTA anteriorly Abdomen: soft Extremities: R BKA in stump protector, no LLE edema Dialysis Access: R forearm AVF + thrill  Additional Objective Labs: Basic Metabolic Panel: Recent Labs  Lab 05/05/19 1418 05/07/19 0705 05/08/19 1847  NA 137 134* 134*  K 4.2 5.9* 3.7  CL 94* 96* 92*  CO2 25  --  24  GLUCOSE 105* 113* 118*  BUN 39* 85* 56*  CREATININE 6.38* 10.60* 6.59*  CALCIUM 8.7*  --  8.6*  PHOS  --   --  6.7*   Liver Function Tests: Recent Labs  Lab 05/08/19 1847  ALBUMIN 2.1*   CBC: Recent Labs  Lab 05/05/19 0745 05/07/19 0705 05/08/19 1847  WBC 9.7  --  8.5  HGB 8.9* 9.5* 7.5*  HCT 28.3* 28.0* 24.0*  MCV 105.2*  --  105.3*  PLT 145*  --  160   Medications: . sodium chloride    . sodium chloride    . magnesium sulfate bolus IVPB     . amiodarone  200 mg Oral Daily  . atorvastatin  80 mg Oral Q supper  . carvedilol  3.125 mg Oral BID  . Chlorhexidine Gluconate Cloth  6 each Topical Q0600  . clopidogrel  75 mg Oral Q supper  . darbepoetin (ARANESP) injection - DIALYSIS  100 mcg Intravenous Q Fri-HD  . docusate sodium  100 mg Oral Daily  . heparin  5,000 Units Subcutaneous Q8H  . midodrine  10 mg Oral Q M,W,F-HD  . multivitamin  1 tablet Oral QHS  . pantoprazole  40 mg Oral Daily  . sodium chloride flush  3 mL Intravenous Q12H  . sucroferric oxyhydroxide  1,250 mg Oral TID WC  . vitamin B-12  1,000 mcg Oral QPM    Dialysis Orders: MWF at [facility] 4hr, 400/800, EDW 76.5kg, 2K/2Ca,  UFP #4, AVF, -Heparin 2400units IV TIW - Parsabiv 5mg  IV q HD - Calcitriol 1.54mcg PO q HD - Mircera 60mcg IV q 2 weeks (last given 43mcg on 1/20, Hgb 10.3 on 2/10)  Assessment/Plan: 1. PAD with gangrenous changes: S/pR TMA on 2/17 -> poor healing ->s/p R BKA 2/25. VVS following. 2. ESRD: Back to MWF schedule - next Monday 3/1. 3. Hypertension/volume:BP controlled, no edema. EDW will be lowered on d/c. 4. Anemia:Hgbdown to 7.5 with post-op drop. Continue Aranesp q Friday. 5. Metabolic bone disease:Ca ok,Phos up slightly. Continuevelphoro as binder. 6. Hx TAVR 7. CAD/HFrEF  Veneta Penton, PA-C 05/10/2019, 9:28 AM  [facility]

## 2019-05-10 NOTE — Progress Notes (Addendum)
Patient lethargic and really weak compared to this morning when I left around 7am. Right groin looks infected with white pus coming out. No WBC in the past two days, last WBC on 05/08/2019 was 8.5. Current vitals are stable. Will continue to monitor patient and notify MD if any changes occur. Will also pass on to day shift nurse.  Vitals:   05/10/19 2009 05/10/19 2123  BP: (!) 140/58 (!) 136/59  Pulse: 69 71  Resp: 15   Temp: 98.9 F (37.2 C)   SpO2: 97%

## 2019-05-10 NOTE — Progress Notes (Addendum)
Progress Note    05/10/2019 7:20 AM 3 Days Post-Op  Subjective:  Says he just feels tired this morning. Pain tolerable over night   Vitals:   05/10/19 0303 05/10/19 0621  BP: 121/62   Pulse: (!) 59   Resp: 16 13  Temp: 97.9 F (36.6 C)   SpO2: 97%    Physical Exam: General: well appearing, looks comfortable Lungs:  Non labored Incisions:  Right groin incision clean, dry and intact. Right below knee amputation stump clean, dry, intact. No fluid collections. Minimal ecchymosis along staple line Extremities:  2+ femoral pulse, right below knee amputation without edema. Left femoral pulse 2+, left lower extremity well perfused, warm.  Abdomen:  Soft, non tender Neurologic: alert and oriented  CBC    Component Value Date/Time   WBC 8.5 05/08/2019 1847   RBC 2.28 (L) 05/08/2019 1847   HGB 7.5 (L) 05/08/2019 1847   HGB 11.4 (L) 11/12/2018 1637   HGB 10.8 (L) 03/14/2017 0921   HCT 24.0 (L) 05/08/2019 1847   HCT 35.0 (L) 11/12/2018 1637   HCT 32.5 (L) 03/14/2017 0921   PLT 160 05/08/2019 1847   PLT 51 (LL) 11/12/2018 1637   MCV 105.3 (H) 05/08/2019 1847   MCV 103 (H) 11/12/2018 1637   MCV 106.2 (H) 03/14/2017 0921   MCH 32.9 05/08/2019 1847   MCHC 31.3 05/08/2019 1847   RDW 13.9 05/08/2019 1847   RDW 14.6 11/12/2018 1637   RDW 14.6 03/14/2017 0921   LYMPHSABS 1.0 11/12/2018 1637   LYMPHSABS 0.8 (L) 03/14/2017 0921   MONOABS 0.4 10/20/2018 0638   MONOABS 0.6 03/14/2017 0921   EOSABS 0.2 11/12/2018 1637   BASOSABS 0.0 11/12/2018 1637   BASOSABS 0.0 03/14/2017 0921    BMET    Component Value Date/Time   NA 134 (L) 05/08/2019 1847   NA 135 07/14/2018 0909   NA 141 03/14/2017 0921   K 3.7 05/08/2019 1847   K 4.1 03/14/2017 0921   CL 92 (L) 05/08/2019 1847   CO2 24 05/08/2019 1847   CO2 33 (H) 03/14/2017 0921   GLUCOSE 118 (H) 05/08/2019 1847   GLUCOSE 144 (H) 03/14/2017 0921   BUN 56 (H) 05/08/2019 1847   BUN 76 (HH) 07/14/2018 0909   BUN 30.1 (H)  03/14/2017 0921   CREATININE 6.59 (H) 05/08/2019 1847   CREATININE 9.16 (HH) 09/17/2017 0954   CREATININE 8.1 (HH) 03/14/2017 0921   CALCIUM 8.6 (L) 05/08/2019 1847   CALCIUM 9.5 03/14/2017 0921   GFRNONAA 8 (L) 05/08/2019 1847   GFRNONAA 5 (L) 09/17/2017 0954   GFRNONAA 24 (L) 06/30/2014 1528   GFRAA 9 (L) 05/08/2019 1847   GFRAA 6 (L) 09/17/2017 0954   GFRAA 28 (L) 06/30/2014 1528    INR    Component Value Date/Time   INR 1.2 04/29/2019 0713   INR 1.10 (L) 01/21/2015 0809     Intake/Output Summary (Last 24 hours) at 05/10/2019 0720 Last data filed at 05/10/2019 0509 Gross per 24 hour  Intake 477 ml  Output 1 ml  Net 476 ml     Assessment/Plan:  69 y.o. male is s/p right below knee amputation 3 Days Post-Op. Stump healing well, c/d. Continue PT/OT. Recommendation is CIR. CIR consult placed. Acute blood loss anemia post op will obtain labs at HD 3/1 to monitor. Continue HD per Nephrology   DVT prophylaxis:  SQ Heparin   Karoline Caldwell, PA-C [facility] [phone] 05/10/2019 7:20 AM   I have independently  interviewed and examined patient and agree with PA assessment and plan above.   He is on Plavix and a statin for previous bilateral lower extreme interventions  Archana Eckman C. Donzetta Matters, MD [facility] [place] Office: [phone] Pager: [phone]

## 2019-05-11 ENCOUNTER — Inpatient Hospital Stay (HOSPITAL_COMMUNITY): Payer: Medicare Other

## 2019-05-11 LAB — CBC
HCT: 25.6 % — ABNORMAL LOW (ref 39.0–52.0)
Hemoglobin: 8.1 g/dL — ABNORMAL LOW (ref 13.0–17.0)
MCH: 32.8 pg (ref 26.0–34.0)
MCHC: 31.6 g/dL (ref 30.0–36.0)
MCV: 103.6 fL — ABNORMAL HIGH (ref 80.0–100.0)
Platelets: 153 10*3/uL (ref 150–400)
RBC: 2.47 MIL/uL — ABNORMAL LOW (ref 4.22–5.81)
RDW: 14 % (ref 11.5–15.5)
WBC: 10.8 10*3/uL — ABNORMAL HIGH (ref 4.0–10.5)
nRBC: 0.3 % — ABNORMAL HIGH (ref 0.0–0.2)

## 2019-05-11 LAB — RENAL FUNCTION PANEL
Albumin: 2.3 g/dL — ABNORMAL LOW (ref 3.5–5.0)
Anion gap: 19 — ABNORMAL HIGH (ref 5–15)
BUN: 56 mg/dL — ABNORMAL HIGH (ref 8–23)
CO2: 22 mmol/L (ref 22–32)
Calcium: 8.3 mg/dL — ABNORMAL LOW (ref 8.9–10.3)
Chloride: 94 mmol/L — ABNORMAL LOW (ref 98–111)
Creatinine, Ser: 8.45 mg/dL — ABNORMAL HIGH (ref 0.61–1.24)
GFR calc Af Amer: 7 mL/min — ABNORMAL LOW (ref >60)
GFR calc non Af Amer: 6 mL/min — ABNORMAL LOW (ref >60)
Glucose, Bld: 87 mg/dL (ref 70–99)
Phosphorus: 7.1 mg/dL — ABNORMAL HIGH (ref 2.5–4.6)
Potassium: 4.2 mmol/L (ref 3.5–5.1)
Sodium: 135 mmol/L (ref 135–145)

## 2019-05-11 LAB — SURGICAL PATHOLOGY

## 2019-05-11 MED ORDER — VANCOMYCIN HCL 1500 MG/300ML IV SOLN
1500.0000 mg | Freq: Once | INTRAVENOUS | Status: AC
  Administered 2019-05-11: 1500 mg via INTRAVENOUS
  Filled 2019-05-11: qty 300

## 2019-05-11 MED ORDER — PIPERACILLIN-TAZOBACTAM 3.375 G IVPB: 3.3750 g | Freq: Two times a day (BID) | INTRAVENOUS | Status: DC

## 2019-05-11 MED ORDER — PIPERACILLIN-TAZOBACTAM IN DEX 2-0.25 GM/50ML IV SOLN
2.2500 g | Freq: Three times a day (TID) | INTRAVENOUS | Status: DC
  Administered 2019-05-11 – 2019-05-12 (×3): 2.25 g via INTRAVENOUS
  Filled 2019-05-11 (×5): qty 50

## 2019-05-11 MED ORDER — PIPERACILLIN-TAZOBACTAM IN DEX 2-0.25 GM/50ML IV SOLN: 2.2500 g | Freq: Three times a day (TID) | INTRAVENOUS | Status: DC

## 2019-05-11 MED ORDER — VANCOMYCIN HCL 500 MG/100ML IV SOLN: 500.0000 mg | INTRAVENOUS | Status: DC

## 2019-05-11 MED ORDER — VANCOMYCIN HCL 750 MG/150ML IV SOLN: 750.0000 mg | INTRAVENOUS | Status: DC

## 2019-05-11 NOTE — Progress Notes (Addendum)
Progress Note    05/11/2019 12:16 PM 4 Days Post-Op  Subjective: Patient just back from hemodialysis treatment.  Patient is warm to touch and attend RN states his temp is 103.  She noted emesis in the patient bed upon arrival.  Question of whether this was bloodstained.  He is lethargic but answers questions and follows commands.  04/10/2019: Right femoral to below-knee popliteal artery translocated vein bypass 05/07/2019: Right below the knee amputation  Vitals:   05/11/19 1100 05/11/19 1113  BP: (!) 137/57 (!) 170/58  Pulse: (!) 105 (!) 103  Resp: (!) 23 (!) 26  Temp:  98.3 F (36.8 C)  SpO2:  96%    Physical Exam: Cardiac: Rate is slightly elevated, rhythm regular Lungs: Poor effort at deep inspiration but unlabored. Incisions: Right groin incision separated in one small area without drainage.  Right residual limb dressing removed.  His incision is well approximated with an approximate 1 cm area of ischemic skin.  No drainage.  Both incisions are without surrounding erythema or induration. Extremities: Both feet warm, pink with active range of motion.  Left forearm fistula with good bruit, no bleeding or signs of infection. Abdomen: Soft, nondistended, positive bowel sounds        CBC    Component Value Date/Time   WBC 10.8 (H) 05/11/2019 0818   RBC 2.47 (L) 05/11/2019 0818   HGB 8.1 (L) 05/11/2019 0818   HGB 11.4 (L) 11/12/2018 1637   HGB 10.8 (L) 03/14/2017 0921   HCT 25.6 (L) 05/11/2019 0818   HCT 35.0 (L) 11/12/2018 1637   HCT 32.5 (L) 03/14/2017 0921   PLT 153 05/11/2019 0818   PLT 51 (LL) 11/12/2018 1637   MCV 103.6 (H) 05/11/2019 0818   MCV 103 (H) 11/12/2018 1637   MCV 106.2 (H) 03/14/2017 0921   MCH 32.8 05/11/2019 0818   MCHC 31.6 05/11/2019 0818   RDW 14.0 05/11/2019 0818   RDW 14.6 11/12/2018 1637   RDW 14.6 03/14/2017 0921   LYMPHSABS 1.0 11/12/2018 1637   LYMPHSABS 0.8 (L) 03/14/2017 0921   MONOABS 0.4 10/20/2018 0638   MONOABS 0.6 03/14/2017  0921   EOSABS 0.2 11/12/2018 1637   BASOSABS 0.0 11/12/2018 1637   BASOSABS 0.0 03/14/2017 0921    BMET    Component Value Date/Time   NA 135 05/11/2019 0818   NA 135 07/14/2018 0909   NA 141 03/14/2017 0921   K 4.2 05/11/2019 0818   K 4.1 03/14/2017 0921   CL 94 (L) 05/11/2019 0818   CO2 22 05/11/2019 0818   CO2 33 (H) 03/14/2017 0921   GLUCOSE 87 05/11/2019 0818   GLUCOSE 144 (H) 03/14/2017 0921   BUN 56 (H) 05/11/2019 0818   BUN 76 (HH) 07/14/2018 0909   BUN 30.1 (H) 03/14/2017 0921   CREATININE 8.45 (H) 05/11/2019 0818   CREATININE 9.16 (HH) 09/17/2017 0954   CREATININE 8.1 (HH) 03/14/2017 0921   CALCIUM 8.3 (L) 05/11/2019 0818   CALCIUM 9.5 03/14/2017 0921   GFRNONAA 6 (L) 05/11/2019 0818   GFRNONAA 5 (L) 09/17/2017 0954   GFRNONAA 24 (L) 06/30/2014 1528   GFRAA 7 (L) 05/11/2019 0818   GFRAA 6 (L) 09/17/2017 0954   GFRAA 28 (L) 06/30/2014 1528     Intake/Output Summary (Last 24 hours) at 05/11/2019 1216 Last data filed at 05/11/2019 1113 Gross per 24 hour  Intake --  Output 1219 ml  Net -1219 ml    HOSPITAL MEDICATIONS Scheduled Meds: . amiodarone  200 mg  Oral Daily  . atorvastatin  80 mg Oral Q supper  . carvedilol  3.125 mg Oral BID  . Chlorhexidine Gluconate Cloth  6 each Topical Q0600  . clopidogrel  75 mg Oral Q supper  . darbepoetin (ARANESP) injection - DIALYSIS  100 mcg Intravenous Q Fri-HD  . docusate sodium  100 mg Oral Daily  . heparin  5,000 Units Subcutaneous Q8H  . midodrine  10 mg Oral Q M,W,F-HD  . multivitamin  1 tablet Oral QHS  . pantoprazole  40 mg Oral Daily  . sodium chloride flush  3 mL Intravenous Q12H  . sucroferric oxyhydroxide  1,250 mg Oral TID WC  . vitamin B-12  1,000 mcg Oral QPM   Continuous Infusions: . sodium chloride    . sodium chloride    . magnesium sulfate bolus IVPB     PRN Meds:.sodium chloride, acetaminophen **OR** acetaminophen, alum & mag hydroxide-simeth, bisacodyl, guaiFENesin-dextromethorphan,  hydrALAZINE, HYDROmorphone (DILAUDID) injection, labetalol, LORazepam, magnesium sulfate bolus IVPB, methocarbamol, metoprolol tartrate, ondansetron, oxyCODONE, phenol, prochlorperazine, senna-docusate, sodium chloride flush, temazepam  Assessment:  69 y.o. male is s/p: Right femoral to popliteal bypass with ipsilateral vein approximately 4 weeks ago.  Recent right BKA.  Febrile this afternoon.  No signs of infection surgical incisions.   4 Days Post-Op  Plan: -Blood and urine cultures, chest x-ray, empiric anitbiotics -DVT prophylaxis: Heparin subcutaneous   Risa Grill, PA-C [facility] [phone] 05/11/2019  12:16 PM    I have interviewed patient and examined with PA and agree with assessment and plan above.  No obvious source of infection right lower extremity incision sites previous left lower extremity incisions are well-healed.  We will culture begin antibiotics.  I attempted to communicate the plan to his daughter [name] I was unable to reach her we will try again later.  Chasitee Zenker C. Donzetta Matters, MD [facility] [place] Office: [phone] Pager: [phone]

## 2019-05-11 NOTE — Progress Notes (Signed)
Pharmacy Antibiotic Note  Jose Garrett is a 69 y.o. male admitted on 04/29/2019 with recent right BKA, febrile this afternoon.  Pharmacy has been consulted for vancomycin and zosyn dosing.  ESRD -HD MWF on schedule  Plan: Vancomycin 1500 mg IV x 1, then 750 mg IV every HD Zosyn 2.25g IV every 8 hours Monitor HD schedule, Cx and clinical progression to narrow Vancomycin random level as needed  Height: (pt refused bath. ) Weight: (P) 148 lb 9.4 oz (67.4 kg) IBW/kg (Calculated) : 73  Temp (24hrs), Avg:99.5 F (37.5 C), Min:98.3 F (36.8 C), Max:103.6 F (39.8 C)  Recent Labs  Lab 05/05/19 0745 05/05/19 1418 05/07/19 0705 05/08/19 1847 05/11/19 0818  WBC 9.7  --   --  8.5 10.8*  CREATININE  --  6.38* 10.60* 6.59* 8.45*    Estimated Creatinine Clearance: 8.2 mL/min (A) (by C-G formula based on SCr of 8.45 mg/dL (H)).    No Known Allergies  Antimicrobials this admission: Vanc 3/1>> Zosyn 3/1>>  Dose adjustments this admission: n/a  Microbiology results: 3/1 BCx: sent 3/1 UCx: sent  Bertis Ruddy, PharmD Clinical Pharmacist Please check AMION for all [place] numbers 05/11/2019 2:22 PM

## 2019-05-11 NOTE — Progress Notes (Signed)
[facility] Progress Note   Subjective:   Pt seen on HD, not feeling well this AM. States he feels cold, shaking chills noted and c/o R leg pain. Temp 98.67F. Nursing noted purulent drainage from R groin incision. Pt denies CP, palpitations, SOB, abdominal pain, nausea. Labs drawn this AM, results pending. Slightly hypertensive, VSS. Spoke to unit and RN reportedly informed attending of changes this AM.   Objective Vitals:   05/11/19 0241 05/11/19 0428 05/11/19 0629 05/11/19 0730  BP: (!) 145/90 (!) 136/59  (!) 165/68  Pulse: 79 77  82  Resp: 20 17 19 18   Temp: 98.7 F (37.1 C) 98.8 F (37.1 C)    TempSrc: Oral Oral    SpO2:  91%    Weight:      Height:       Physical Exam General: Well developed male, appears weak with shaking/chills noted Heart: RRR, no murmurs, rubs or gallops Lungs: CTA b/l without wheezing, rhonchi or rales Abdomen: Soft, non-tender, non-distended, +BS Extremities: no LE edema, R BKA in stump protector Dialysis Access: R forearm AVF cannulated  Additional Objective Labs: Basic Metabolic Panel: Recent Labs  Lab 05/05/19 1418 05/07/19 0705 05/08/19 1847  NA 137 134* 134*  K 4.2 5.9* 3.7  CL 94* 96* 92*  CO2 25  --  24  GLUCOSE 105* 113* 118*  BUN 39* 85* 56*  CREATININE 6.38* 10.60* 6.59*  CALCIUM 8.7*  --  8.6*  PHOS  --   --  6.7*   Liver Function Tests: Recent Labs  Lab 05/08/19 1847  ALBUMIN 2.1*   CBC: Recent Labs  Lab 05/05/19 0745 05/07/19 0705 05/08/19 1847  WBC 9.7  --  8.5  HGB 8.9* 9.5* 7.5*  HCT 28.3* 28.0* 24.0*  MCV 105.2*  --  105.3*  PLT 145*  --  160   Blood Culture    Component Value Date/Time   SDES URINE, RANDOM 02/01/2016 1707   SPECREQUEST NONE 02/01/2016 1707   CULT (A) 02/01/2016 1707    10,000 COLONIES/mL PSEUDOMONAS AERUGINOSA 40,000 COLONIES/mL ENTEROBACTER AEROGENES    REPTSTATUS 02/04/2016 FINAL 02/01/2016 1707    CBG: Recent Labs  Lab 05/06/19 1724 05/08/19 2230  GLUCAP 93  86    Studies/Results: No results found. Medications: . sodium chloride    . sodium chloride    . magnesium sulfate bolus IVPB     . amiodarone  200 mg Oral Daily  . atorvastatin  80 mg Oral Q supper  . carvedilol  3.125 mg Oral BID  . Chlorhexidine Gluconate Cloth  6 each Topical Q0600  . clopidogrel  75 mg Oral Q supper  . darbepoetin (ARANESP) injection - DIALYSIS  100 mcg Intravenous Q Fri-HD  . docusate sodium  100 mg Oral Daily  . heparin  5,000 Units Subcutaneous Q8H  . midodrine  10 mg Oral Q M,W,F-HD  . multivitamin  1 tablet Oral QHS  . pantoprazole  40 mg Oral Daily  . sodium chloride flush  3 mL Intravenous Q12H  . sucroferric oxyhydroxide  1,250 mg Oral TID WC  . vitamin B-12  1,000 mcg Oral QPM    Dialysis Orders: MWF at [facility] 4hr, 400/800, EDW 76.5kg, 2K/2Ca, UFP #4, AVF, -Heparin 2400units IV TIW - Parsabiv 5mg  IV q HD - Calcitriol 1.5mcg PO q HD - Mircera 47mcg IV q 2 weeks (last given 15mcg on 1/20, Hgb 10.3 on 2/10)  Assessment/Plan: 1. PAD with gangrenous changes: S/pR TMA on 2/17-> poor healing ->s/p  R BKA 2/25.Purulent drainage from groin incision and chills this AM, afebrile with mild HTN. ?Concern for possible infection. Per primary team. 2. ESRD:On MWF schedule, HD today. Last K+ 3.7, labs drawn this AM and pending.  3. Hypertension/volume:BP slightly elevated, no edema. EDW will be lowered on d/c after amputation. 4. Anemia:Hgbdown to 7.5 withpost-op drop. ContinueAranesp q Friday. Updated CBC pending.  5. Metabolic bone disease:Last Ca ok,Phosup slightly.Continuevelphoro as binder. AM labs pending.  6. Hx TAVR 7. CAD/HFrEF  Anice Paganini, PA-C 05/11/2019, 8:00 AM  [facility] Pager: [phone]

## 2019-05-11 NOTE — Progress Notes (Signed)
Patient returned from HD with yellow vomitus on face, neck & chest.  Soiled with stool, lethargic &  temp 103.6 ax.   Patient alert to self only & unable to follow commands.   R leg incisions assessed and bandage changed. PA and MD present

## 2019-05-11 NOTE — Progress Notes (Signed)
Hx of no urine production.  Bladder scan completed.  No urine avail to obtain C&S.

## 2019-05-11 NOTE — Progress Notes (Signed)
Inpatient Rehabilitation Admissions Coordinator  Inpatient rehab consult received. Pt returned form dialysis and noted temp 103.6 currently. I will follow up tomorrow  Danne Baxter, RN, MSN Rehab Admissions Coordinator [phone] 05/11/2019 1:13 PM

## 2019-05-12 DIAGNOSIS — Z89511 Acquired absence of right leg below knee: Secondary | ICD-10-CM

## 2019-05-12 DIAGNOSIS — R7881 Bacteremia: Secondary | ICD-10-CM

## 2019-05-12 DIAGNOSIS — Z992 Dependence on renal dialysis: Secondary | ICD-10-CM

## 2019-05-12 DIAGNOSIS — B952 Enterococcus as the cause of diseases classified elsewhere: Secondary | ICD-10-CM

## 2019-05-12 DIAGNOSIS — Z952 Presence of prosthetic heart valve: Secondary | ICD-10-CM

## 2019-05-12 DIAGNOSIS — I739 Peripheral vascular disease, unspecified: Principal | ICD-10-CM

## 2019-05-12 DIAGNOSIS — N186 End stage renal disease: Secondary | ICD-10-CM

## 2019-05-12 DIAGNOSIS — I2510 Atherosclerotic heart disease of native coronary artery without angina pectoris: Secondary | ICD-10-CM

## 2019-05-12 DIAGNOSIS — Z87891 Personal history of nicotine dependence: Secondary | ICD-10-CM

## 2019-05-12 LAB — CBC WITH DIFFERENTIAL/PLATELET
Abs Immature Granulocytes: 0.52 10*3/uL — ABNORMAL HIGH (ref 0.00–0.07)
Basophils Absolute: 0.1 10*3/uL (ref 0.0–0.1)
Basophils Relative: 0 %
Eosinophils Absolute: 0.1 10*3/uL (ref 0.0–0.5)
Eosinophils Relative: 1 %
HCT: 25.2 % — ABNORMAL LOW (ref 39.0–52.0)
Hemoglobin: 7.6 g/dL — ABNORMAL LOW (ref 13.0–17.0)
Immature Granulocytes: 5 %
Lymphocytes Relative: 5 %
Lymphs Abs: 0.6 10*3/uL — ABNORMAL LOW (ref 0.7–4.0)
MCH: 31.9 pg (ref 26.0–34.0)
MCHC: 30.2 g/dL (ref 30.0–36.0)
MCV: 105.9 fL — ABNORMAL HIGH (ref 80.0–100.0)
Monocytes Absolute: 0.7 10*3/uL (ref 0.1–1.0)
Monocytes Relative: 6 %
Neutro Abs: 9.3 10*3/uL — ABNORMAL HIGH (ref 1.7–7.7)
Neutrophils Relative %: 83 %
Platelets: 130 10*3/uL — ABNORMAL LOW (ref 150–400)
RBC: 2.38 MIL/uL — ABNORMAL LOW (ref 4.22–5.81)
RDW: 14.3 % (ref 11.5–15.5)
WBC: 11.3 10*3/uL — ABNORMAL HIGH (ref 4.0–10.5)
nRBC: 0.2 % (ref 0.0–0.2)

## 2019-05-12 LAB — BLOOD CULTURE ID PANEL (REFLEXED)

## 2019-05-12 MED ORDER — SUCROFERRIC OXYHYDROXIDE 500 MG PO CHEW
1500.0000 mg | CHEWABLE_TABLET | Freq: Three times a day (TID) | ORAL | Status: DC
  Administered 2019-05-12 – 2019-05-18 (×4): 1500 mg via ORAL
  Filled 2019-05-12 (×20): qty 3

## 2019-05-12 MED ORDER — SODIUM CHLORIDE 0.9 % IV SOLN
2.0000 g | Freq: Two times a day (BID) | INTRAVENOUS | Status: DC
  Administered 2019-05-12 – 2019-05-14 (×4): 2 g via INTRAVENOUS
  Filled 2019-05-12: qty 2000
  Filled 2019-05-12 (×3): qty 2
  Filled 2019-05-12 (×2): qty 2000

## 2019-05-12 MED ORDER — CALCITRIOL 0.25 MCG PO CAPS
1.5000 ug | ORAL_CAPSULE | ORAL | Status: DC
  Administered 2019-05-15 – 2019-05-18 (×2): 1.5 ug via ORAL
  Filled 2019-05-12 (×2): qty 6

## 2019-05-12 NOTE — Progress Notes (Signed)
Inpatient Rehabilitation Admissions Coordinator  Inpatient rehab consult received. I met with patient at bedside for rehab assessment. We discussed goals and expectations of a possible inpt rehab admit. He prefers CIR . I will contact his daughter and begin insurance authorization for a possible cir admit.  Danne Baxter, RN, MSN Rehab Admissions Coordinator [phone] 05/12/2019 2:08 PM

## 2019-05-12 NOTE — Progress Notes (Addendum)
[facility] Progress Note   Subjective:   Patient seen in room. Fatigued but feeling better today. Denies SOB, CP, palpitations, abdominal pain. Mild nausea but no vomiting. Does not make urine and no urine available to culture on bladder scan. Reports some swelling of his AVF yesterday- examined and appears to have had mild infiltration.   Objective Vitals:   05/11/19 2007 05/12/19 0648 05/12/19 0700 05/12/19 0800  BP: (!) 122/50 (!) 128/43 (!) 134/53 (!) 120/48  Pulse: 66     Resp: 15 19  17   Temp: 97.8 F (36.6 C) 97.7 F (36.5 C)    TempSrc: Oral Oral    SpO2: 100% 96%    Weight:      Height:       Physical Exam General: Well developed male, alert and in NAD Heart: RRR, no murmurs, rubs or gallops Lungs: CTA bilaterally without wheezing, rhonchi or rales Abdomen: Soft, non-tender, non-distended, +BS Extremities:no LE edema, R BKA in stump protector Dialysis Access: R forearm AVF with mild bruising, no edema, + thrill/bruit  Additional Objective Labs: Basic Metabolic Panel: Recent Labs  Lab 05/05/19 1418 05/05/19 1418 05/07/19 0705 05/08/19 1847 05/11/19 0818  NA 137   < > 134* 134* 135  K 4.2   < > 5.9* 3.7 4.2  CL 94*   < > 96* 92* 94*  CO2 25  --   --  24 22  GLUCOSE 105*   < > 113* 118* 87  BUN 39*   < > 85* 56* 56*  CREATININE 6.38*   < > 10.60* 6.59* 8.45*  CALCIUM 8.7*  --   --  8.6* 8.3*  PHOS  --   --   --  6.7* 7.1*   < > = values in this interval not displayed.   Liver Function Tests: Recent Labs  Lab 05/08/19 1847 05/11/19 0818  ALBUMIN 2.1* 2.3*   CBC: Recent Labs  Lab 05/08/19 1847 05/11/19 0818 05/12/19 0256  WBC 8.5 10.8* 11.3*  NEUTROABS  --   --  9.3*  HGB 7.5* 8.1* 7.6*  HCT 24.0* 25.6* 25.2*  MCV 105.3* 103.6* 105.9*  PLT 160 153 130*   Blood Culture    Component Value Date/Time   SDES BLOOD LEFT ANTECUBITAL 05/11/2019 1428   SPECREQUEST  05/11/2019 1428    BOTTLES DRAWN AEROBIC AND ANAEROBIC Blood  Culture results may not be optimal due to an inadequate volume of blood received in culture bottles   CULT GRAM POSITIVE COCCI 05/11/2019 1428   REPTSTATUS PENDING 05/11/2019 1428    CBG: Recent Labs  Lab 05/06/19 1724 05/08/19 2230  GLUCAP 93 86    Studies/Results: X-ray chest PA or AP  Result Date: 05/11/2019 CLINICAL DATA:  Fever. EXAM: CHEST  1 VIEW COMPARISON:  10/21/2018 in CT chest 09/30/2018. FINDINGS: No acute findings. Aortic valve replacement. Thoracic aorta is calcified. Lungs are somewhat hyperinflated but clear. No pleural fluid. IMPRESSION: No acute findings. Electronically Signed   By: Lorin Picket M.D.   On: 05/11/2019 14:28   Medications: . sodium chloride    . sodium chloride    . magnesium sulfate bolus IVPB    . piperacillin-tazobactam (ZOSYN)  IV 2.25 g (05/12/19 0645)  . [START ON 05/13/2019] vancomycin     . amiodarone  200 mg Oral Daily  . atorvastatin  80 mg Oral Q supper  . carvedilol  3.125 mg Oral BID  . Chlorhexidine Gluconate Cloth  6 each Topical Q0600  . clopidogrel  75 mg Oral Q supper  . darbepoetin (ARANESP) injection - DIALYSIS  100 mcg Intravenous Q Fri-HD  . docusate sodium  100 mg Oral Daily  . heparin  5,000 Units Subcutaneous Q8H  . midodrine  10 mg Oral Q M,W,F-HD  . multivitamin  1 tablet Oral QHS  . pantoprazole  40 mg Oral Daily  . sodium chloride flush  3 mL Intravenous Q12H  . sucroferric oxyhydroxide  1,250 mg Oral TID WC  . vitamin B-12  1,000 mcg Oral QPM    Dialysis Orders: MWF at [facility] 4hr, 400/800, EDW 76.5kg, 2K/2Ca, UFP #4, AVF, -Heparin 2400units IV TIW - Parsabiv 5mg  IV q HD - Calcitriol 1.40mcg PO q HD - Mircera 35mcg IV q 2 weeks (last given 31mcg on 1/20, Hgb 10.3 on 2/10)  Assessment/Plan: 1. PAD with gangrenous changes: S/pR TMA on 2/17-> poor healing ->s/p R BKA 2/25.VVS following. 2. Sepsis: +blood cx with GPC, on empiric vancomycin and zosyn. Unclear source, incision sites clean per VVS, CXR  without active dz, no urine available to culture.  3. ESRD:On MWF schedule, next HD 3/3. K+ controlled.  4. Hypertension/volume: BP controlled, no edema. On midodrine pre-HD. EDW will be lowered on d/c after amputation. 5. Anemia:Hgbdown to 7.6 withpost-op drop. ContinueAranesp q Friday. Updated CBC pending.  6. Metabolic bone disease:Corrected calcium 9.7,Phos7.1 and trending up.Increase velphoro. Ordered outpatient calcitriol. Parsabiv not on hospital formulary.  7. Hx TAVR 8. CAD/HFrEF  Anice Paganini, PA-C 05/12/2019, 10:23 AM  [facility] Pager: [phone]  Nephrology attending: Patient was seen and examined at bedside.  Chart reviewed.  I agree with assessment and plan as outlined above.  He looks more alert and having breakfast this morning.  Plan for next dialysis tomorrow.  Work-up for sepsis/infection ongoing.  Katheran James, MD [facility].

## 2019-05-12 NOTE — Progress Notes (Signed)
Occupational Therapy Treatment Patient Details Name: Jose Garrett MRN: [id number] DOB: 06-Jun-1950 Today's Date: 05/12/2019    History of present illness Pt is a 69 y.o. male with recent hx of R LE bypass (04/10/19), now admitted 04/29/19 with progressive gangrenous changes of toes and dorsum of foot. S/p R transmet amputation 2/17. S/p R BKA 2/25. PMH includes aortic stenosis, HTN, HLD, CHF, CAD, PD, anxiety, ESRD (HD MWF). 2-25 pt had BKA.   OT comments  Patient happy to see therapists "I was just thinking about you guys," and motivated to participate. Patient min A for bed mobility for safety with trunk R LE management. Patient able to maintain sitting balance at EOB this session without posterior leaning. With min/mod A x2 and cues for body mechanics patient able to maintain weight bearing through L LE and take multiple hops within his room with cues for sequencing/safety. Patient require seated rest break and R residual limb with minor bleeding. At end of session patient state "look at that I'm sitting up," and was encouraged by his progress. Educate patient on chair push ups for repositioning and strengthening of UEs to compensate for R BKA during mobility, transfers.   Follow Up Recommendations  CIR;SNF;Supervision/Assistance - 24 hour;Other (comment)(if patient unable to go to CIR, will need SNF)    Equipment Recommendations  Other (comment)(defer to next venue)       Precautions / Restrictions Precautions Precautions: Fall Restrictions Weight Bearing Restrictions: Yes RLE Weight Bearing: Non weight bearing       Mobility Bed Mobility Overal bed mobility: Needs Assistance Bed Mobility: Supine to Sit     Supine to sit: Min assist     General bed mobility comments: min A for safety with trunk stability  Transfers Overall transfer level: Needs assistance Equipment used: Rolling walker (2 wheeled) Transfers: Sit to/from Stand Sit to Stand: Min assist;Mod assist;+2 physical  assistance;+2 safety/equipment;From elevated surface         General transfer comment: verbal cues for body mechanics, patient shows improvement in weight bearing through L LE    Balance Overall balance assessment: Needs assistance Sitting-balance support: No upper extremity supported;Feet supported Sitting balance-Leahy Scale: Good Sitting balance - Comments: patient able to maintain stability at edge of bed this session   Standing balance support: Bilateral upper extremity supported;During functional activity Standing balance-Leahy Scale: Poor Standing balance comment: reliant on B UE support and min A x2 however able to bear weight through L LE this session                           ADL either performed or assessed with clinical judgement   ADL Overall ADL's : Needs assistance/impaired                     Lower Body Dressing: Maximal assistance;Sitting/lateral leans Lower Body Dressing Details (indicate cue type and reason): don sock to L foot Toilet Transfer: Moderate assistance;+2 for physical assistance;+2 for safety/equipment;BSC;RW;Cueing for safety;Cueing for sequencing;Minimal assistance Toilet Transfer Details (indicate cue type and reason): simulated with mobility and transfer to recliner, improved ability to maintain weight bearing through L LE and hop to perform transfer                           Cognition Arousal/Alertness: Awake/alert Behavior During Therapy: [facility] for tasks assessed/performed Overall Cognitive Status: No family/caregiver present to determine baseline cognitive functioning Area of Impairment:  Safety/judgement;Memory                     Memory: Decreased short-term memory   Safety/Judgement: Decreased awareness of safety              Exercises Exercises: General Upper Extremity General Exercises - Upper Extremity Chair Push Up: AROM;Strengthening;Both;5 reps      General Comments encourage patient to  perform chair push ups while in recliner to maximize safety and independence with transfers/mobility    Pertinent Vitals/ Pain       Pain Assessment: Faces Faces Pain Scale: Hurts little more Pain Location: R residual limb Pain Descriptors / Indicators: Grimacing;Guarding;Sore Pain Intervention(s): Monitored during session         Frequency  Min 2X/week        Progress Toward Goals  OT Goals(current goals can now be found in the care plan section)  Progress towards OT goals: Progressing toward goals  Acute Rehab OT Goals Patient Stated Goal: Pt wanting to go to CIR for therapy and then hoping to go home OT Goal Formulation: With patient Time For Goal Achievement: 05/22/19 Potential to Achieve Goals: Good ADL Goals Pt Will Perform Grooming: with supervision;sitting Pt Will Perform Lower Body Bathing: with min assist;sitting/lateral leans;with adaptive equipment;sit to/from stand Pt Will Perform Lower Body Dressing: with min assist;sitting/lateral leans;sit to/from stand;with adaptive equipment Pt Will Transfer to Toilet: with min assist;bedside commode;stand pivot transfer Pt Will Perform Toileting - Clothing Manipulation and hygiene: with min guard assist;sit to/from stand Pt/caregiver will Perform Home Exercise Program: Increased strength;Both right and left upper extremity;With theraband;With written HEP provided Additional ADL Goal #1: pt will perform adl with min A and AE as needed Additional ADL Goal #2: pt will perform bed mob at min guard level, using strap for RLE as needed  Plan Discharge plan remains appropriate    Co-evaluation    PT/OT/SLP Co-Evaluation/Treatment: Yes Reason for Co-Treatment: For patient/therapist safety;To address functional/ADL transfers   OT goals addressed during session: ADL's and self-care      AM-PAC OT "6 Clicks" Daily Activity     Outcome Measure   Help from another person eating meals?: None Help from another person taking  care of personal grooming?: A Little Help from another person toileting, which includes using toliet, bedpan, or urinal?: A Lot Help from another person bathing (including washing, rinsing, drying)?: A Lot Help from another person to put on and taking off regular upper body clothing?: A Little Help from another person to put on and taking off regular lower body clothing?: A Lot 6 Click Score: 16    End of Session Equipment Utilized During Treatment: Rolling walker;Gait belt  OT Visit Diagnosis: Other abnormalities of gait and mobility (R26.89);Muscle weakness (generalized) (M62.81);Pain Pain - Right/Left: Right Pain - part of body: Leg   Activity Tolerance Patient tolerated treatment well   Patient Left in chair;with call bell/phone within reach;with chair alarm set   Nurse Communication Mobility status        Time: 4034-7425 OT Time Calculation (min): 18 min  Charges: OT General Charges $OT Visit: 1 Visit  [place] OT office: [phone]   Rosemary Holms 05/12/2019, 2:35 PM

## 2019-05-12 NOTE — Consult Note (Signed)
[facility] for Infectious Disease  Total days of antibiotics 2/                Reason for Consult: enterococcal bacteremia    Referring Physician: cain  Active Problems:   PAD (peripheral artery disease) (HCC)    HPI: Jose Garrett is a 69 y.o. male with CAD, s/p TARV in Aug 2020, ESRD on HD with R AVF, PAD s/p right BKA on 2/25 with some post op blood loss doing well until 3/1 where he had isolated fever up to 103F. Blood cx grew enterococcus. Patient started on vancomycin and piptazo empirically. His surgical site of R BKA does not look to be infected still some oozing from incision.   Past Medical History:  Diagnosis Date  . Amyloidosis ([place])   . Anemia of chronic disease   . Anxiety   . CAD (coronary artery disease)    a. cardiac cath on 11/25/17 with occlusion of the RCA which could have been his event but the RCA could not be engaged. The RCA filled distally from left to right collaterals. There were no severe stenoses in the left coronary system. Medical therapy recommended, started on plavix.  . Chronic combined systolic and diastolic CHF (congestive heart failure) ([place])    a. EF dropped to 35-40% with grade 2 DD by echo 11/2017.  [name] DJD (degenerative joint disease), lumbar   . ESRD (end stage renal disease) ([place])    ESRD- HEMO MWF- [facility]  . GERD (gastroesophageal reflux disease)   . Hyperlipidemia   . Hypertension   . Myocardial infarction ([place])   . Neuropathy   . Peripheral vascular disease ([place])   . Pneumonia   . Restless legs   . S/P TAVR (transcatheter aortic valve replacement) 10/21/2018   26 mm Edwards Sapien 3 Ultra transcatheter heart valve placed via percutaneous right transfemoral approach   . Severe aortic stenosis    severe    Allergies: No Known Allergies  MEDICATIONS: . amiodarone  200 mg Oral Daily  . atorvastatin  80 mg Oral Q supper  . [START ON 05/13/2019] calcitRIOL  1.5 mcg Oral Q M,W,F-HD  . carvedilol  3.125 mg Oral BID  .  Chlorhexidine Gluconate Cloth  6 each Topical Q0600  . clopidogrel  75 mg Oral Q supper  . darbepoetin (ARANESP) injection - DIALYSIS  100 mcg Intravenous Q Fri-HD  . docusate sodium  100 mg Oral Daily  . heparin  5,000 Units Subcutaneous Q8H  . midodrine  10 mg Oral Q M,W,F-HD  . multivitamin  1 tablet Oral QHS  . pantoprazole  40 mg Oral Daily  . sodium chloride flush  3 mL Intravenous Q12H  . sucroferric oxyhydroxide  1,500 mg Oral TID WC  . vitamin B-12  1,000 mcg Oral QPM    Social History   Tobacco Use  . Smoking status: Former Smoker    Packs/day: 2.00    Years: 40.00    Pack years: 80.00    Types: Cigarettes    Quit date: 03/19/2011    Years since quitting: 8.1  . Smokeless tobacco: Never Used  Substance Use Topics  . Alcohol use: No    Alcohol/week: 0.0 standard drinks  . Drug use: No    Comment:  "I have used marijuana till the 1990's". Notes that he quit 15 yrs ago    Family History  Problem Relation Age of Onset  . Hypertension Mother   . Diabetes Mother   .  Heart disease Mother   . Skin cancer Mother   . Hypertension Father   . Diabetes Father   . Diabetes Sister      Review of Systems  Constitutional: Negative for fever, chills, diaphoresis, activity change, appetite change, fatigue and unexpected weight change.  HENT: Negative for congestion, sore throat, rhinorrhea, sneezing, trouble swallowing and sinus pressure.  Eyes: Negative for photophobia and visual disturbance.  Respiratory: Negative for cough, chest tightness, shortness of breath, wheezing and stridor.  Cardiovascular: Negative for chest pain, palpitations and leg swelling.  Gastrointestinal: Negative for nausea, vomiting, abdominal pain, diarrhea, constipation, blood in stool, abdominal distention and anal bleeding.  Genitourinary: Negative for dysuria, hematuria, flank pain and difficulty urinating.  Musculoskeletal: Negative for myalgias, back pain, joint swelling, arthralgias and gait  problem.  Skin: some oozing of leg Neurological: Negative for dizziness, tremors, weakness and light-headedness.  Hematological: Negative for adenopathy. Does not bruise/bleed easily.  Psychiatric/Behavioral: Negative for behavioral problems, confusion, sleep disturbance, dysphoric mood, decreased concentration and agitation.     OBJECTIVE: Temp:  [97.6 F (36.4 C)-97.8 F (36.6 C)] 97.6 F (36.4 C) (03/02 1352) Pulse Rate:  [66-74] 74 (03/02 1352) Resp:  [15-19] 18 (03/02 1352) BP: (91-134)/(42-53) 91/47 (03/02 1352) SpO2:  [93 %-100 %] 93 % (03/02 1352) Physical Exam  Constitutional: He is oriented to person, place, and time. He appears well-developed and well-nourished. No distress.  HENT:  Mouth/Throat: Oropharynx is clear and moist. No oropharyngeal exudate.  Cardiovascular: Normal rate, regular rhythm and normal heart sounds. 2/6 SEM, soft. Exam reveals no gallop and no friction rub.  Pulmonary/Chest: Effort normal and breath sounds normal. No respiratory distress. He has no wheezes.  Abdominal: Soft. Bowel sounds are normal. He exhibits no distension. There is no tenderness.  Lymphadenopathy:  He has no cervical adenopathy.  Ext: R AVF + thrill. Right BKA has no surrounding erythema or foul drainage from incision Neurological: He is alert and oriented to person, place, and time.  Skin: Skin is warm and dry. No rash noted. No erythema.  Psychiatric: He has a normal mood and affect. His behavior is normal.      LABS: Results for orders placed or performed during the hospital encounter of 04/29/19 (from the past 48 hour(s))  Renal function panel     Status: Abnormal   Collection Time: 05/11/19  8:18 AM  Result Value Ref Range   Sodium 135 135 - 145 mmol/L   Potassium 4.2 3.5 - 5.1 mmol/L   Chloride 94 (L) 98 - 111 mmol/L   CO2 22 22 - 32 mmol/L   Glucose, Bld 87 70 - 99 mg/dL    Comment: Glucose reference range applies only to samples taken after fasting for at least 8  hours.   BUN 56 (H) 8 - 23 mg/dL   Creatinine, Ser 8.45 (H) 0.61 - 1.24 mg/dL   Calcium 8.3 (L) 8.9 - 10.3 mg/dL   Phosphorus 7.1 (H) 2.5 - 4.6 mg/dL   Albumin 2.3 (L) 3.5 - 5.0 g/dL   GFR calc non Af Amer 6 (L) >60 mL/min   GFR calc Af Amer 7 (L) >60 mL/min   Anion gap 19 (H) 5 - 15    Comment: Performed at [facility] [address] [place],  [address]  CBC     Status: Abnormal   Collection Time: 05/11/19  8:18 AM  Result Value Ref Range   WBC 10.8 (H) 4.0 - 10.5 K/uL   RBC 2.47 (L)  4.22 - 5.81 MIL/uL   Hemoglobin 8.1 (L) 13.0 - 17.0 g/dL   HCT 25.6 (L) 39.0 - 52.0 %   MCV 103.6 (H) 80.0 - 100.0 fL   MCH 32.8 26.0 - 34.0 pg   MCHC 31.6 30.0 - 36.0 g/dL   RDW 14.0 11.5 - 15.5 %   Platelets 153 150 - 400 K/uL   nRBC 0.3 (H) 0.0 - 0.2 %    Comment: Performed at [facility] [address], [place], [place] [address]  Culture, blood (Routine X 2) w Reflex to ID Panel     Status: None (Preliminary result)   Collection Time: 05/11/19  2:20 PM   Specimen: BLOOD  Result Value Ref Range   Specimen Description BLOOD LEFT ANTECUBITAL    Special Requests      BOTTLES DRAWN AEROBIC AND ANAEROBIC Blood Culture adequate volume   Culture  Setup Time      GRAM POSITIVE COCCI IN PAIRS IN CHAINS IN BOTH AEROBIC AND ANAEROBIC BOTTLES CRITICAL RESULT CALLED TO, READ BACK BY AND VERIFIED WITH: Salli Real [id number] 05/12/2019 Mena Goes Performed at [facility], [place] [address] [place], [place] [address]    Culture GRAM POSITIVE COCCI    Report Status PENDING   Blood Culture ID Panel (Reflexed)     Status: Abnormal   Collection Time: 05/11/19  2:20 PM  Result Value Ref Range   Enterococcus species DETECTED (A) NOT DETECTED    Comment: CRITICAL RESULT CALLED TO, READ BACK BY AND VERIFIED WITH: G. ABBOTT,PHARMD [id number] 05/12/2019 T. TYSOR    Vancomycin resistance NOT DETECTED NOT DETECTED   Listeria monocytogenes NOT DETECTED NOT DETECTED   Staphylococcus species  NOT DETECTED NOT DETECTED   Staphylococcus aureus (BCID) NOT DETECTED NOT DETECTED   Streptococcus species NOT DETECTED NOT DETECTED   Streptococcus agalactiae NOT DETECTED NOT DETECTED   Streptococcus pneumoniae NOT DETECTED NOT DETECTED   Streptococcus pyogenes NOT DETECTED NOT DETECTED   Acinetobacter baumannii NOT DETECTED NOT DETECTED   Enterobacteriaceae species NOT DETECTED NOT DETECTED   Enterobacter cloacae complex NOT DETECTED NOT DETECTED   Escherichia coli NOT DETECTED NOT DETECTED   Klebsiella oxytoca NOT DETECTED NOT DETECTED   Klebsiella pneumoniae NOT DETECTED NOT DETECTED   Proteus species NOT DETECTED NOT DETECTED   Serratia marcescens NOT DETECTED NOT DETECTED   Haemophilus influenzae NOT DETECTED NOT DETECTED   Neisseria meningitidis NOT DETECTED NOT DETECTED   Pseudomonas aeruginosa NOT DETECTED NOT DETECTED   Candida albicans NOT DETECTED NOT DETECTED   Candida glabrata NOT DETECTED NOT DETECTED   Candida krusei NOT DETECTED NOT DETECTED   Candida parapsilosis NOT DETECTED NOT DETECTED   Candida tropicalis NOT DETECTED NOT DETECTED    Comment: Performed at [facility], [place] [address] [place], [place] [address]  Culture, blood (Routine X 2) w Reflex to ID Panel     Status: None (Preliminary result)   Collection Time: 05/11/19  2:28 PM   Specimen: BLOOD  Result Value Ref Range   Specimen Description BLOOD LEFT ANTECUBITAL    Special Requests      BOTTLES DRAWN AEROBIC AND ANAEROBIC Blood Culture results may not be optimal due to an inadequate volume of blood received in culture bottles   Culture  Setup Time      GRAM POSITIVE COCCI IN PAIRS IN CHAINS IN BOTH AEROBIC AND ANAEROBIC BOTTLES CRITICAL RESULT CALLED TO, READ BACK BY AND VERIFIED WITH: G. ABBOTT,PHARMD [id number] 05/12/2019 T. Rosalia Hammers  Performed at [facility], [place] [address] [place], [place] [address]    Culture GRAM POSITIVE COCCI    Report Status PENDING   CBC with Differential/Platelet      Status: Abnormal   Collection Time: 05/12/19  2:56 AM  Result Value Ref Range   WBC 11.3 (H) 4.0 - 10.5 K/uL   RBC 2.38 (L) 4.22 - 5.81 MIL/uL   Hemoglobin 7.6 (L) 13.0 - 17.0 g/dL   HCT 25.2 (L) 39.0 - 52.0 %   MCV 105.9 (H) 80.0 - 100.0 fL   MCH 31.9 26.0 - 34.0 pg   MCHC 30.2 30.0 - 36.0 g/dL   RDW 14.3 11.5 - 15.5 %   Platelets 130 (L) 150 - 400 K/uL   nRBC 0.2 0.0 - 0.2 %   Neutrophils Relative % 83 %   Neutro Abs 9.3 (H) 1.7 - 7.7 K/uL   Lymphocytes Relative 5 %   Lymphs Abs 0.6 (L) 0.7 - 4.0 K/uL   Monocytes Relative 6 %   Monocytes Absolute 0.7 0.1 - 1.0 K/uL   Eosinophils Relative 1 %   Eosinophils Absolute 0.1 0.0 - 0.5 K/uL   Basophils Relative 0 %   Basophils Absolute 0.1 0.0 - 0.1 K/uL   Immature Granulocytes 5 %   Abs Immature Granulocytes 0.52 (H) 0.00 - 0.07 K/uL    Comment: Performed at [facility], [place] [address], [place], [place] [address]    MICRO: Blood cx 3/1 shows 1 of 2sets with enterococcus IMAGING: X-ray chest PA or AP  Result Date: 05/11/2019 CLINICAL DATA:  Fever. EXAM: CHEST  1 VIEW COMPARISON:  10/21/2018 in CT chest 09/30/2018. FINDINGS: No acute findings. Aortic valve replacement. Thoracic aorta is calcified. Lungs are somewhat hyperinflated but clear. No pleural fluid. IMPRESSION: No acute findings. Electronically Signed   By: Lorin Picket M.D.   On: 05/11/2019 14:28    Assessment/Plan:  69yo M with TAVR, ESRD, and PAD with recent BKA found to have enterococcal bacteremia  - continue on ampicillin for now, will follow up on sensitivity to decide if need to change course - please get TEE to evaluate TAVR for signs of endocarditis - please repeat blood cx on 3/4 to document clearance - unclear where his source of enterococcal infection has come from since he is anuric and had quick onset of fever - if further fever, would repeat blood cx  Lauralyn Shadowens [facility] [phone]

## 2019-05-12 NOTE — Progress Notes (Addendum)
PHARMACY - PHYSICIAN COMMUNICATION CRITICAL VALUE ALERT - BLOOD CULTURE IDENTIFICATION (BCID)  Jose Garrett is an 69 y.o. male s/p BKA 2/25, now with fevers  Assessment:   2/2 blood cultures growing Enterococcus  Name of physician (or Provider) Contacted:  Dr. Donzetta Matters  Current antibiotics: Vancomycin and Zosyn  Changes to prescribed antibiotics recommended:   Recommend narrowing to Ampicillin 2 g IV q12h (adjusted for ESRD)  Results for orders placed or performed during the hospital encounter of 04/29/19  Blood Culture ID Panel (Reflexed) (Collected: 05/11/2019  2:20 PM)  Result Value Ref Range   Enterococcus species DETECTED (A) NOT DETECTED   Vancomycin resistance NOT DETECTED NOT DETECTED   Listeria monocytogenes NOT DETECTED NOT DETECTED   Staphylococcus species NOT DETECTED NOT DETECTED   Staphylococcus aureus (BCID) NOT DETECTED NOT DETECTED   Streptococcus species NOT DETECTED NOT DETECTED   Streptococcus agalactiae NOT DETECTED NOT DETECTED   Streptococcus pneumoniae NOT DETECTED NOT DETECTED   Streptococcus pyogenes NOT DETECTED NOT DETECTED   Acinetobacter baumannii NOT DETECTED NOT DETECTED   Enterobacteriaceae species NOT DETECTED NOT DETECTED   Enterobacter cloacae complex NOT DETECTED NOT DETECTED   Escherichia coli NOT DETECTED NOT DETECTED   Klebsiella oxytoca NOT DETECTED NOT DETECTED   Klebsiella pneumoniae NOT DETECTED NOT DETECTED   Proteus species NOT DETECTED NOT DETECTED   Serratia marcescens NOT DETECTED NOT DETECTED   Haemophilus influenzae NOT DETECTED NOT DETECTED   Neisseria meningitidis NOT DETECTED NOT DETECTED   Pseudomonas aeruginosa NOT DETECTED NOT DETECTED   Candida albicans NOT DETECTED NOT DETECTED   Candida glabrata NOT DETECTED NOT DETECTED   Candida krusei NOT DETECTED NOT DETECTED   Candida parapsilosis NOT DETECTED NOT DETECTED   Candida tropicalis NOT DETECTED NOT DETECTED    Caryl Pina 05/12/2019  6:43 AM

## 2019-05-12 NOTE — Progress Notes (Signed)
Physical Therapy Treatment Patient Details Name: Jose Garrett MRN: [id number] DOB: 07-05-1950 Today's Date: 05/12/2019    History of Present Illness Pt is a 69 y.o. male with recent hx of R LE bypass (04/10/19), now admitted 04/29/19 with progressive gangrenous changes of toes and dorsum of foot. S/p R transmet amputation 2/17. S/p R BKA 2/25. PMH includes aortic stenosis, HTN, HLD, CHF, CAD, PD, anxiety, ESRD (HD MWF). 2-25 pt had BKA.    PT Comments    Patient is making progress toward PT goals and eager to mobilize. Pt tolerated short distance gait training well. Pt bleeding from R LE incision so OOB mobility limited. Pt educated on precautions/positioning for R LE. Continue to recommend CIR for further skilled PT services to maximize independence and safety with mobility.     Follow Up Recommendations  CIR;Supervision/Assistance - 24 hour     Equipment Recommendations  None recommended by PT    Recommendations for Other Services       Precautions / Restrictions Precautions Precautions: Fall Restrictions Weight Bearing Restrictions: Yes RLE Weight Bearing: Non weight bearing    Mobility  Bed Mobility Overal bed mobility: Needs Assistance Bed Mobility: Supine to Sit     Supine to sit: Min assist     General bed mobility comments: min A for safety with trunk stability  Transfers Overall transfer level: Needs assistance Equipment used: Rolling walker (2 wheeled) Transfers: Sit to/from Stand Sit to Stand: Min assist;Mod assist;+2 physical assistance;+2 safety/equipment;From elevated surface         General transfer comment: assist to power up and to steady; verbal cues for body mechanics, patient shows improvement in weight bearing through L LE  Ambulation/Gait Ambulation/Gait assistance: Min assist;+2 safety/equipment Gait Distance (Feet): 10 Feet Assistive device: Rolling walker (2 wheeled) Gait Pattern/deviations: Step-to pattern Gait velocity: decreased    General Gait Details: cues for sequencing, upright posture, and proximity to [employer] Mobility    Modified Rankin (Stroke Patients Only)       Balance Overall balance assessment: Needs assistance Sitting-balance support: No upper extremity supported;Feet supported Sitting balance-Leahy Scale: Good Sitting balance - Comments: patient able to maintain stability at edge of bed this session   Standing balance support: Bilateral upper extremity supported;During functional activity Standing balance-Leahy Scale: Poor Standing balance comment: reliant on B UE support and min A x2 however able to bear weight through L LE this session                            Cognition Arousal/Alertness: Awake/alert Behavior During Therapy: WFL for tasks assessed/performed Overall Cognitive Status: No family/caregiver present to determine baseline cognitive functioning Area of Impairment: Safety/judgement;Memory                     Memory: Decreased short-term memory   Safety/Judgement: Decreased awareness of safety            Exercises Total Joint Exercises Quad Sets: AROM;5 reps;Seated General Exercises - Upper Extremity Chair Push Up: AROM;Strengthening;Both;5 reps    General Comments General comments (skin integrity, edema, etc.): R LE incision bleeding       Pertinent Vitals/Pain Pain Assessment: Faces Faces Pain Scale: Hurts little more Pain Location: R residual limb Pain Descriptors / Indicators: Grimacing;Guarding;Sore Pain Intervention(s): Limited activity within patient's tolerance;Monitored during session;Repositioned    Home Living  Prior Function            PT Goals (current goals can now be found in the care plan section) Acute Rehab PT Goals Patient Stated Goal: Pt wanting to go to CIR for therapy and then hoping to go home Progress towards PT goals: Progressing toward goals     Frequency    Min 3X/week      PT Plan Current plan remains appropriate    Co-evaluation PT/OT/SLP Co-Evaluation/Treatment: Yes Reason for Co-Treatment: For patient/therapist safety;To address functional/ADL transfers PT goals addressed during session: Mobility/safety with mobility;Balance OT goals addressed during session: ADL's and self-care      AM-PAC PT "6 Clicks" Mobility   Outcome Measure  Help needed turning from your back to your side while in a flat bed without using bedrails?: A Little Help needed moving from lying on your back to sitting on the side of a flat bed without using bedrails?: A Little Help needed moving to and from a bed to a chair (including a wheelchair)?: A Little Help needed standing up from a chair using your arms (e.g., wheelchair or bedside chair)?: A Lot Help needed to walk in hospital room?: A Lot Help needed climbing 3-5 steps with a railing? : Total 6 Click Score: 14    End of Session Equipment Utilized During Treatment: Gait belt Activity Tolerance: Patient tolerated treatment well;Other (comment)(limited by R LE bleeding ) Patient left: in chair;with call bell/phone within reach Nurse Communication: Mobility status PT Visit Diagnosis: Other abnormalities of gait and mobility (R26.89);Muscle weakness (generalized) (M62.81);Difficulty in walking, not elsewhere classified (R26.2);Pain Pain - Right/Left: Right Pain - part of body: Leg     Time: 1140-1158 PT Time Calculation (min) (ACUTE ONLY): 18 min  Charges:  $Gait Training: 8-22 mins                     Earney Navy, PTA Acute Rehabilitation Services Pager: [phone] Office: [phone]     Darliss Cheney 05/12/2019, 2:54 PM

## 2019-05-12 NOTE — Progress Notes (Addendum)
Progress Note    05/12/2019 7:27 AM 5 Days Post-Op  Subjective:  Says he just doesn't feel right like he has an infection  Tm 103.6 now afebrile HR 70's-100's NSR 440'N-027'O systolic 536% RA  Abx:  Vanc Zosyn  Vitals:   05/11/19 2007 05/12/19 0648  BP: (!) 122/50 (!) 128/43  Pulse: 66   Resp: 15 19  Temp: 97.8 F (36.6 C) 97.7 F (36.5 C)  SpO2: 100% 96%    Physical Exam: Incisions:  Right BKA stump incision looks very good without erythema or drainage.  Saphenous vein harvest sites also healing nicely.  Distal area of groin incision with small amount of fibrinous tissue but no evidence of infection.     CBC    Component Value Date/Time   WBC 11.3 (H) 05/12/2019 0256   RBC 2.38 (L) 05/12/2019 0256   HGB 7.6 (L) 05/12/2019 0256   HGB 11.4 (L) 11/12/2018 1637   HGB 10.8 (L) 03/14/2017 0921   HCT 25.2 (L) 05/12/2019 0256   HCT 35.0 (L) 11/12/2018 1637   HCT 32.5 (L) 03/14/2017 0921   PLT 130 (L) 05/12/2019 0256   PLT 51 (LL) 11/12/2018 1637   MCV 105.9 (H) 05/12/2019 0256   MCV 103 (H) 11/12/2018 1637   MCV 106.2 (H) 03/14/2017 0921   MCH 31.9 05/12/2019 0256   MCHC 30.2 05/12/2019 0256   RDW 14.3 05/12/2019 0256   RDW 14.6 11/12/2018 1637   RDW 14.6 03/14/2017 0921   LYMPHSABS 0.6 (L) 05/12/2019 0256   LYMPHSABS 1.0 11/12/2018 1637   LYMPHSABS 0.8 (L) 03/14/2017 0921   MONOABS 0.7 05/12/2019 0256   MONOABS 0.6 03/14/2017 0921   EOSABS 0.1 05/12/2019 0256   EOSABS 0.2 11/12/2018 1637   BASOSABS 0.1 05/12/2019 0256   BASOSABS 0.0 11/12/2018 1637   BASOSABS 0.0 03/14/2017 0921    BMET    Component Value Date/Time   NA 135 05/11/2019 0818   NA 135 07/14/2018 0909   NA 141 03/14/2017 0921   K 4.2 05/11/2019 0818   K 4.1 03/14/2017 0921   CL 94 (L) 05/11/2019 0818   CO2 22 05/11/2019 0818   CO2 33 (H) 03/14/2017 0921   GLUCOSE 87 05/11/2019 0818   GLUCOSE 144 (H) 03/14/2017 0921   BUN 56 (H) 05/11/2019 0818   BUN 76 (HH) 07/14/2018 0909   BUN  30.1 (H) 03/14/2017 0921   CREATININE 8.45 (H) 05/11/2019 0818   CREATININE 9.16 (HH) 09/17/2017 0954   CREATININE 8.1 (HH) 03/14/2017 0921   CALCIUM 8.3 (L) 05/11/2019 0818   CALCIUM 9.5 03/14/2017 0921   GFRNONAA 6 (L) 05/11/2019 0818   GFRNONAA 5 (L) 09/17/2017 0954   GFRNONAA 24 (L) 06/30/2014 1528   GFRAA 7 (L) 05/11/2019 0818   GFRAA 6 (L) 09/17/2017 0954   GFRAA 28 (L) 06/30/2014 1528    INR    Component Value Date/Time   INR 1.2 04/29/2019 0713   INR 1.10 (L) 01/21/2015 0809     Intake/Output Summary (Last 24 hours) at 05/12/2019 0727 Last data filed at 05/12/2019 0000 Gross per 24 hour  Intake 100 ml  Output 1218 ml  Net -1118 ml     Assessment/Plan:  69 y.o. male is s/p right below knee amputation  5 Days Post-Op  -pt amputation incision and other incisions look good.  There is a small area of fibrinous tissue at the distal portion of the groin incision but no evidence of infection.  Dry gauze tucked in to wick  moisture.  -pt does have +blood cx with GPC-continue current abx until sensitivities are back.   -pt doing well straightening knee -acute blood loss anemia-tolerating-wouldn't transfuse at this point.   Leontine Locket, PA-C [facility] [phone] 05/12/2019 7:27 AM    I have independently interviewed and examined patient and agree with PA assessment and plan above. abx tailored to augmentin. Looks much better today now that he is on abx.  Byard Carranza C. Donzetta Matters, MD [facility] [place] Office: [phone] Pager: [phone]

## 2019-05-13 ENCOUNTER — Inpatient Hospital Stay (HOSPITAL_COMMUNITY): Payer: Medicare Other

## 2019-05-13 DIAGNOSIS — B952 Enterococcus as the cause of diseases classified elsewhere: Secondary | ICD-10-CM

## 2019-05-13 DIAGNOSIS — R7881 Bacteremia: Secondary | ICD-10-CM

## 2019-05-13 DIAGNOSIS — R011 Cardiac murmur, unspecified: Secondary | ICD-10-CM

## 2019-05-13 LAB — RENAL FUNCTION PANEL
Albumin: 2 g/dL — ABNORMAL LOW (ref 3.5–5.0)
Anion gap: 19 — ABNORMAL HIGH (ref 5–15)
BUN: 48 mg/dL — ABNORMAL HIGH (ref 8–23)
CO2: 22 mmol/L (ref 22–32)
Calcium: 8 mg/dL — ABNORMAL LOW (ref 8.9–10.3)
Chloride: 94 mmol/L — ABNORMAL LOW (ref 98–111)
Creatinine, Ser: 7 mg/dL — ABNORMAL HIGH (ref 0.61–1.24)
GFR calc Af Amer: 9 mL/min — ABNORMAL LOW (ref >60)
GFR calc non Af Amer: 7 mL/min — ABNORMAL LOW (ref >60)
Glucose, Bld: 92 mg/dL (ref 70–99)
Phosphorus: 6.6 mg/dL — ABNORMAL HIGH (ref 2.5–4.6)
Potassium: 3.2 mmol/L — ABNORMAL LOW (ref 3.5–5.1)
Sodium: 135 mmol/L (ref 135–145)

## 2019-05-13 LAB — CBC
HCT: 23.1 % — ABNORMAL LOW (ref 39.0–52.0)
Hemoglobin: 7.1 g/dL — ABNORMAL LOW (ref 13.0–17.0)
MCH: 32.3 pg (ref 26.0–34.0)
MCHC: 30.7 g/dL (ref 30.0–36.0)
MCV: 105 fL — ABNORMAL HIGH (ref 80.0–100.0)
Platelets: 122 10*3/uL — ABNORMAL LOW (ref 150–400)
RBC: 2.2 MIL/uL — ABNORMAL LOW (ref 4.22–5.81)
RDW: 14.3 % (ref 11.5–15.5)
WBC: 7.9 10*3/uL (ref 4.0–10.5)
nRBC: 0 % (ref 0.0–0.2)

## 2019-05-13 LAB — ECHOCARDIOGRAM COMPLETE
Height: 70 in
Weight: 2366.86 oz

## 2019-05-13 MED ORDER — CALCITRIOL 0.5 MCG PO CAPS
ORAL_CAPSULE | ORAL | Status: AC
  Administered 2019-05-13: 1.5 ug via ORAL
  Filled 2019-05-13: qty 3

## 2019-05-13 NOTE — Progress Notes (Signed)
Physical Therapy Treatment Patient Details Name: Jose Garrett MRN: [id number] DOB: Jan 04, 1951 Today's Date: 05/13/2019    History of Present Illness Pt is a 69 y.o. male with recent hx of R LE bypass (04/10/19), now admitted 04/29/19 with progressive gangrenous changes of toes and dorsum of foot. S/p R transmet amputation 2/17. S/p R BKA 2/25. PMH includes aortic stenosis, HTN, HLD, CHF, CAD, PD, anxiety, ESRD (HD MWF). 2-25 pt had BKA.    PT Comments    Patient seen for mobility progression. Pt continues to be eager to participate in therapy despite pain and fatigue today. Continue to recommend CIR for further skilled PT services to maximize independence and safety with mobility.    Follow Up Recommendations  CIR;Supervision/Assistance - 24 hour     Equipment Recommendations  None recommended by PT    Recommendations for Other Services       Precautions / Restrictions Precautions Precautions: Fall Required Braces or Orthoses: Knee Immobilizer - Right Knee Immobilizer - Right: On at all times Restrictions Weight Bearing Restrictions: Yes RLE Weight Bearing: Non weight bearing    Mobility  Bed Mobility Overal bed mobility: Needs Assistance Bed Mobility: Sit to Supine     Supine to sit: Min guard;HOB elevated Sit to supine: Supervision   General bed mobility comments: supervision for safety  Transfers Overall transfer level: Needs assistance Equipment used: Rolling walker (2 wheeled) Transfers: Sit to/from [employer] Sit to Stand: Mod assist;+2 physical assistance Stand pivot transfers: Min assist;+2 safety/equipment       General transfer comment: mod A +2 to power up into standing from recliner first trial; pillow placed in seat and pt required mod A second trial from recliner; min A +2 for safety with stand pivot chair to bed; cues for hand placement/positioning and use of AD when pivoting   Ambulation/Gait Ambulation/Gait assistance: Min  assist;+2 safety/equipment Gait Distance (Feet): 6 Feet Assistive device: Rolling walker (2 wheeled) Gait Pattern/deviations: Step-to pattern Gait velocity: decreased   General Gait Details: cues for sequencing, upright posture, and proximity to [employer]             Wheelchair Mobility    Modified Rankin (Stroke Patients Only)       Balance Overall balance assessment: Needs assistance Sitting-balance support: No upper extremity supported;Feet supported Sitting balance-Leahy Scale: Good     Standing balance support: Bilateral upper extremity supported;During functional activity Standing balance-Leahy Scale: Poor                              Cognition Arousal/Alertness: Awake/alert Behavior During Therapy: WFL for tasks assessed/performed Overall Cognitive Status: Within Functional Limits for tasks assessed                                 General Comments: wfls for OT session 3/3      Exercises Amputee Exercises Quad Sets: AROM;Both Knee Extension: AROM;Right;10 reps Straight Leg Raises: AROM;AAROM;Right;10 reps    General Comments        Pertinent Vitals/Pain Pain Assessment: Faces Pain Score: 7  Faces Pain Scale: Hurts even more Pain Location: R residual limb  Pain Descriptors / Indicators: Grimacing;Guarding Pain Intervention(s): Limited activity within patient's tolerance;Monitored during session;Repositioned;Patient requesting pain meds-RN notified;RN gave pain meds during session    Home Living  Prior Function            PT Goals (current goals can now be found in the care plan section) Progress towards PT goals: Progressing toward goals    Frequency    Min 3X/week      PT Plan Current plan remains appropriate    Co-evaluation              AM-PAC PT "6 Clicks" Mobility   Outcome Measure  Help needed turning from your back to your side while in a flat bed without  using bedrails?: A Little Help needed moving from lying on your back to sitting on the side of a flat bed without using bedrails?: A Little Help needed moving to and from a bed to a chair (including a wheelchair)?: A Little Help needed standing up from a chair using your arms (e.g., wheelchair or bedside chair)?: A Lot Help needed to walk in hospital room?: A Lot Help needed climbing 3-5 steps with a railing? : Total 6 Click Score: 14    End of Session Equipment Utilized During Treatment: Gait belt Activity Tolerance: Patient tolerated treatment well Patient left: with call bell/phone within reach;in bed Nurse Communication: Mobility status PT Visit Diagnosis: Other abnormalities of gait and mobility (R26.89);Muscle weakness (generalized) (M62.81);Difficulty in walking, not elsewhere classified (R26.2);Pain Pain - Right/Left: Right Pain - part of body: Leg     Time: 2633-3545 PT Time Calculation (min) (ACUTE ONLY): 34 min  Charges:  $Gait Training: 8-22 mins $Therapeutic Exercise: 8-22 mins                     Earney Navy, PTA Acute Rehabilitation Services Pager: [phone] Office: [phone]     Darliss Cheney 05/13/2019, 4:38 PM

## 2019-05-13 NOTE — Progress Notes (Signed)
Physical Therapy Cancellation Note   05/13/19 0842  PT Visit Information  Last PT Received On 05/13/19  Reason Eval/Treat Not Completed Patient at procedure or test/unavailable (Pt in HD. PT will continue to follow acutely.)   Earney Navy, PTA Acute Rehabilitation Services Pager: [phone] Office: [phone]

## 2019-05-13 NOTE — Progress Notes (Addendum)
[facility] Progress Note   Subjective:   Patient seen on HD. Feeling slightly better today. Denies SOB, CP, palpitations, abdominal pain. Slight nausea but no vomiting. K+ 3.2 this AM, change to 4K bath.   Objective Vitals:   05/13/19 0800 05/13/19 0830 05/13/19 0900 05/13/19 0930  BP: (!) 132/59 (!) 107/53 (!) 151/46 (!) 129/54  Pulse: (!) 59 60 67 70  Resp:      Temp:      TempSrc:      SpO2:      Weight:      Height:       Physical Exam General: Well developed male, alert and in NAD Heart: RRR, no murmurs, rubs or gallops Lungs: CTA bilaterally without wheezing, rhonchi or rales Abdomen: Soft, non-tender, non-distended, +BS Extremities:no LE edema, R BKA in stump protector Dialysis Access: R forearm AVF with mild bruising, no edema, currently accessed  Additional Objective Labs: Basic Metabolic Panel: Recent Labs  Lab 05/08/19 1847 05/11/19 0818 05/13/19 0734  NA 134* 135 135  K 3.7 4.2 3.2*  CL 92* 94* 94*  CO2 24 22 22   GLUCOSE 118* 87 92  BUN 56* 56* 48*  CREATININE 6.59* 8.45* 7.00*  CALCIUM 8.6* 8.3* 8.0*  PHOS 6.7* 7.1* 6.6*   Liver Function Tests: Recent Labs  Lab 05/08/19 1847 05/11/19 0818 05/13/19 0734  ALBUMIN 2.1* 2.3* 2.0*   CBC: Recent Labs  Lab 05/08/19 1847 05/11/19 0818 05/12/19 0256  WBC 8.5 10.8* 11.3*  NEUTROABS  --   --  9.3*  HGB 7.5* 8.1* 7.6*  HCT 24.0* 25.6* 25.2*  MCV 105.3* 103.6* 105.9*  PLT 160 153 130*   Blood Culture    Component Value Date/Time   SDES BLOOD LEFT ANTECUBITAL 05/11/2019 1428   SPECREQUEST  05/11/2019 1428    BOTTLES DRAWN AEROBIC AND ANAEROBIC Blood Culture results may not be optimal due to an inadequate volume of blood received in culture bottles   CULT ENTEROCOCCUS FAECALIS (A) 05/11/2019 1428   REPTSTATUS PENDING 05/11/2019 1428    CBG: Recent Labs  Lab 05/06/19 1724 05/08/19 2230  GLUCAP 93 86   Studies/Results: X-ray chest PA or AP  Result Date:  05/11/2019 CLINICAL DATA:  Fever. EXAM: CHEST  1 VIEW COMPARISON:  10/21/2018 in CT chest 09/30/2018. FINDINGS: No acute findings. Aortic valve replacement. Thoracic aorta is calcified. Lungs are somewhat hyperinflated but clear. No pleural fluid. IMPRESSION: No acute findings. Electronically Signed   By: Lorin Picket M.D.   On: 05/11/2019 14:28   Medications: . sodium chloride    . sodium chloride    . ampicillin (OMNIPEN) IV Stopped (05/13/19 0332)  . magnesium sulfate bolus IVPB     . amiodarone  200 mg Oral Daily  . atorvastatin  80 mg Oral Q supper  . calcitRIOL  1.5 mcg Oral Q M,W,F-HD  . carvedilol  3.125 mg Oral BID  . Chlorhexidine Gluconate Cloth  6 each Topical Q0600  . clopidogrel  75 mg Oral Q supper  . darbepoetin (ARANESP) injection - DIALYSIS  100 mcg Intravenous Q Fri-HD  . docusate sodium  100 mg Oral Daily  . heparin  5,000 Units Subcutaneous Q8H  . midodrine  10 mg Oral Q M,W,F-HD  . multivitamin  1 tablet Oral QHS  . pantoprazole  40 mg Oral Daily  . sodium chloride flush  3 mL Intravenous Q12H  . sucroferric oxyhydroxide  1,500 mg Oral TID WC  . vitamin B-12  1,000 mcg Oral QPM  Dialysis Orders: MWF at [facility] 4hr, 400/800, EDW 76.5kg, 2K/2Ca, UFP #4, AVF, -Heparin 2400units IV TIW - Parsabiv 5mg  IV q HD - Calcitriol 1.50mcg PO q HD - Mircera 44mcg IV q 2 weeks (last given 8mcg on 1/20, Hgb 10.3 on 2/10)  Assessment/Plan: 1. PAD with gangrenous changes:S/pR TMA on 2/17-> poor healing ->s/p R BKA 2/25.VVS following. 2. Sepsis: +blood cx with GPC, on empiric vancomycin and zosyn. Unclear source, incision sites clean per VVS, CXR without active dz, no urine available to culture. ID following and will need TEE to evaluate TAVR. 3. ESRD:On MWF schedule, next HD 3/4. K+ 3.2 this AM, changed to 4K bath.  4. Hypertension/volume: BP controlled, no edema. On midodrine pre-HD. EDW will be lowered on d/cafter amputation. 5. Anemia:Hgbdown to 7.6  withpost-op drop. ContinueAranesp q Friday.Updated CBC pending. 6. Metabolic bone disease:Corrected calcium 9.6,Phos high- velphoro increased 3/2. Continue outpatient calcitriol. Parsabiv not on hospital formulary.  7. Hx TAVR 8. CAD/HFrEF 9. Dispo: pending CIR  Anice Paganini, PA-C 05/13/2019, 9:45 AM  [facility] Pager: [phone]  Nephrology attending: Patient was seen and examined in dialysis unit.  Chart and lab results reviewed.  I agree with assessment and plan as outlined above. ESRD on HD dialysis today tolerating well.  Changed potassium bath to 4K because of hypokalemia.  The blood culture is growing Enterococcus in both bottles.  ID is following and further evaluation ongoing.  Currently on ampicillin, pending echocardiogram.  Next HD on Friday.  Katheran James, MD [facility].

## 2019-05-13 NOTE — Progress Notes (Signed)
  Echocardiogram 2D Echocardiogram has been performed.  Jose Garrett 05/13/2019, 2:18 PM

## 2019-05-13 NOTE — Progress Notes (Signed)
Occupational Therapy Treatment Patient Details Name: Jose Garrett MRN: [id number] DOB: [birth year] Today's Date: 05/13/2019    History of present illness Pt is a 69 y.o. male with recent hx of R LE bypass (04/10/19), now admitted 04/29/19 with progressive gangrenous changes of toes and dorsum of foot. S/p R transmet amputation 2/17. S/p R BKA 2/25. PMH includes aortic stenosis, HTN, HLD, CHF, CAD, PD, anxiety, ESRD (HD MWF). 2-25 pt had BKA.   OT comments  Pt is very motivated. Had HD today.  Agreeable to work on standing and transfer to chair.  Pt requested pain medication near end of session.  Educated on energy conservation, balancing rest and activity  Follow Up Recommendations  CIR    Equipment Recommendations  3 in 1 bedside commode    Recommendations for Other Services      Precautions / Restrictions Precautions Precautions: Fall Required Braces or Orthoses: Knee Immobilizer - Right Knee Immobilizer - Right: On at all times Restrictions Weight Bearing Restrictions: Yes RLE Weight Bearing: Non weight bearing       Mobility Bed Mobility         Supine to sit: Min guard;HOB elevated     General bed mobility comments: extra time and use of bedrails  Transfers   Equipment used: Rolling walker (2 wheeled)   Sit to Stand: Min assist;Mod assist(more assist to control descent to chair) Stand pivot transfers: Min assist       General transfer comment: min A to stand from high bed; min A for SPT to chair.  Pt used a combination of heel/toe movements and hopped backwards to reach chair.  Assist to control descent to chair    Balance                                           ADL either performed or assessed with clinical judgement   ADL       Grooming: Set up;Sitting                   Toilet Transfer: Minimal assistance;RW(from high bed; assist to control descent)             General ADL Comments: sat EOB for several minutes then  stood for about 30 seconds prior to transferring to chair     Vision       Perception     Praxis      Cognition Arousal/Alertness: Awake/alert Behavior During Therapy: [facility] for tasks assessed/performed                                   General Comments: wfls for OT session 3/3        Exercises     Shoulder Instructions       General Comments      Pertinent Vitals/ Pain       Pain Score: 7  Pain Location: R residual limb and back Pain Descriptors / Indicators: Grimacing;Guarding;Sore Pain Intervention(s): Limited activity within patient's tolerance;Monitored during session;Repositioned;Patient requesting pain meds-RN notified;RN gave pain meds during session  Home Living  Prior Functioning/Environment              Frequency  Min 2X/week        Progress Toward Goals  OT Goals(current goals can now be found in the care plan section)  Progress towards OT goals: Progressing toward goals     Plan      Co-evaluation                 AM-PAC OT "6 Clicks" Daily Activity     Outcome Measure   Help from another person eating meals?: None Help from another person taking care of personal grooming?: A Little Help from another person toileting, which includes using toliet, bedpan, or urinal?: A Lot Help from another person bathing (including washing, rinsing, drying)?: A Lot Help from another person to put on and taking off regular upper body clothing?: A Little Help from another person to put on and taking off regular lower body clothing?: A Lot 6 Click Score: 16    End of Session Equipment Utilized During Treatment: Right knee immobilizer;Gait belt;Rolling walker  OT Visit Diagnosis: Other abnormalities of gait and mobility (R26.89);Muscle weakness (generalized) (M62.81);Pain Pain - Right/Left: Right Pain - part of body: Leg   Activity Tolerance Patient limited by fatigue    Patient Left in chair;with call bell/phone within reach;with chair alarm set   Nurse Communication          Time: 7353-2992 OT Time Calculation (min): 25 min  Charges: OT General Charges $OT Visit: 1 Visit OT Treatments $Therapeutic Activity: 23-37 mins  [employer], OTR/L Acute Rehabilitation Services 05/13/2019   [place] 05/13/2019, 3:27 PM

## 2019-05-13 NOTE — Progress Notes (Signed)
  Progress Note    05/13/2019 8:17 AM 6 Days Post-Op  Subjective:  On hd, no current complaints  Vitals:   05/13/19 0730 05/13/19 0800  BP: (!) 127/58 (!) 132/59  Pulse: (!) 56 (!) 59  Resp:    Temp:    SpO2:      Physical Exam: Awake and alert Groin is cdi bka site with dressing  CBC    Component Value Date/Time   WBC 11.3 (H) 05/12/2019 0256   RBC 2.38 (L) 05/12/2019 0256   HGB 7.6 (L) 05/12/2019 0256   HGB 11.4 (L) 11/12/2018 1637   HGB 10.8 (L) 03/14/2017 0921   HCT 25.2 (L) 05/12/2019 0256   HCT 35.0 (L) 11/12/2018 1637   HCT 32.5 (L) 03/14/2017 0921   PLT 130 (L) 05/12/2019 0256   PLT 51 (LL) 11/12/2018 1637   MCV 105.9 (H) 05/12/2019 0256   MCV 103 (H) 11/12/2018 1637   MCV 106.2 (H) 03/14/2017 0921   MCH 31.9 05/12/2019 0256   MCHC 30.2 05/12/2019 0256   RDW 14.3 05/12/2019 0256   RDW 14.6 11/12/2018 1637   RDW 14.6 03/14/2017 0921   LYMPHSABS 0.6 (L) 05/12/2019 0256   LYMPHSABS 1.0 11/12/2018 1637   LYMPHSABS 0.8 (L) 03/14/2017 0921   MONOABS 0.7 05/12/2019 0256   MONOABS 0.6 03/14/2017 0921   EOSABS 0.1 05/12/2019 0256   EOSABS 0.2 11/12/2018 1637   BASOSABS 0.1 05/12/2019 0256   BASOSABS 0.0 11/12/2018 1637   BASOSABS 0.0 03/14/2017 0921    BMET    Component Value Date/Time   NA 135 05/13/2019 0734   NA 135 07/14/2018 0909   NA 141 03/14/2017 0921   K 3.2 (L) 05/13/2019 0734   K 4.1 03/14/2017 0921   CL 94 (L) 05/13/2019 0734   CO2 22 05/13/2019 0734   CO2 33 (H) 03/14/2017 0921   GLUCOSE 92 05/13/2019 0734   GLUCOSE 144 (H) 03/14/2017 0921   BUN 48 (H) 05/13/2019 0734   BUN 76 (HH) 07/14/2018 0909   BUN 30.1 (H) 03/14/2017 0921   CREATININE 7.00 (H) 05/13/2019 0734   CREATININE 9.16 (HH) 09/17/2017 0954   CREATININE 8.1 (HH) 03/14/2017 0921   CALCIUM 8.0 (L) 05/13/2019 0734   CALCIUM 9.5 03/14/2017 0921   GFRNONAA 7 (L) 05/13/2019 0734   GFRNONAA 5 (L) 09/17/2017 0954   GFRNONAA 24 (L) 06/30/2014 1528   GFRAA 9 (L) 05/13/2019  0734   GFRAA 6 (L) 09/17/2017 0954   GFRAA 28 (L) 06/30/2014 1528    INR    Component Value Date/Time   INR 1.2 04/29/2019 0713   INR 1.10 (L) 01/21/2015 0809     Intake/Output Summary (Last 24 hours) at 05/13/2019 0817 Last data filed at 05/13/2019 0313 Gross per 24 hour  Intake 720 ml  Output --  Net 720 ml     Assessment:  69 y.o. male is s/p r bka. Has + blood cultures with ID following and will need TEE to evaluate TAVR. Pending CIR   Jose Garrett C. Donzetta Matters, MD [facility] [place] Office: [phone] Pager: [phone]  05/13/2019 8:17 AM

## 2019-05-13 NOTE — Progress Notes (Signed)
Patient back from dialysis at this time. V/s and assessment complete. Will continue to monitor.

## 2019-05-13 NOTE — Progress Notes (Signed)
[facility] for Infectious Disease  Date of Admission:  04/29/2019      Total days of antibiotics 3  Day 2 ampicillin            ASSESSMENT: Jose Garrett is a 69 y.o. male with ESRD via RUE AVF, amputation of RLE due to non-healing transtibial amputation after attempt at peripheral bypass in January 2021. S/P TAVR 10-2018. Here with enterococcal bacteremia after fever noted > 7d into hospitalization. Species identified to be enterococcus faecalis with sensitivities pending. Will continue ampicillin for now.   No other cause for enterococcal bacteremia identified at this point. He is anuric, no wounds appear infected from previous and recent surgical sites. Describes weightloss prior to current hospitalization. Concerning picture for endocarditis. Contacted cardiology to discuss TEE for bioprosthetic valve evaluation--> on the schedule for 3/4.  Further recommendations pending TEE.    PLAN: 1. Continue Ampicillin  2. Repeat blood cultures in AM 3/4 3. Follow up TEE results   Principal Problem:   Enterococcal bacteremia Active Problems:   ESRD (end stage renal disease) on dialysis ([facility])   PAD (peripheral artery disease) ([facility])   S/P TAVR (transcatheter aortic valve replacement)   . amiodarone  200 mg Oral Daily  . atorvastatin  80 mg Oral Q supper  . calcitRIOL  1.5 mcg Oral Q M,W,F-HD  . carvedilol  3.125 mg Oral BID  . Chlorhexidine Gluconate Cloth  6 each Topical Q0600  . clopidogrel  75 mg Oral Q supper  . darbepoetin (ARANESP) injection - DIALYSIS  100 mcg Intravenous Q Fri-HD  . docusate sodium  100 mg Oral Daily  . heparin  5,000 Units Subcutaneous Q8H  . midodrine  10 mg Oral Q M,W,F-HD  . multivitamin  1 tablet Oral QHS  . pantoprazole  40 mg Oral Daily  . sodium chloride flush  3 mL Intravenous Q12H  . sucroferric oxyhydroxide  1,500 mg Oral TID WC  . vitamin B-12  1,000 mcg Oral QPM    SUBJECTIVE: Just back from dialysis. Session was  unremarkable.  No other complaints/concerns today.  Groin incisions well healed, remarks on a dull throbbing of the recently amputated leg.    Review of Systems: Review of Systems  Constitutional: Negative for chills, fever, malaise/fatigue and weight loss.  HENT:       No dental problems  Respiratory: Negative for cough and sputum production.   Cardiovascular: Negative for chest pain and leg swelling.  Gastrointestinal: Negative for abdominal pain, diarrhea and vomiting.  Genitourinary: Negative for dysuria and flank pain.  Musculoskeletal: Negative for joint pain (throbbing leg pain ), myalgias and neck pain.  Skin: Negative for rash.  Neurological: Negative for dizziness, tingling and headaches.    No Known Allergies  OBJECTIVE: Vitals:   05/13/19 1000 05/13/19 1030 05/13/19 1056 05/13/19 1135  BP: (!) 112/50 107/72 (!) 123/57 (!) 144/49  Pulse: 66 69 64 65  Resp:   17 18  Temp:   98.1 F (36.7 C) 98.4 F (36.9 C)  TempSrc:   Oral Oral  SpO2:   97% 96%  Weight:   67.1 kg   Height:       Body mass index is 21.23 kg/m.  Physical Exam Vitals reviewed.  Constitutional:      Comments: Resting quietly in bed.   HENT:     Mouth/Throat:     Mouth: No oral lesions.     Dentition: Normal dentition. No dental caries.  Eyes:     General: No scleral icterus. Cardiovascular:     Rate and Rhythm: Normal rate and regular rhythm.     Heart sounds: Murmur (systolic best heard at apex) present.  Pulmonary:     Effort: Pulmonary effort is normal.     Breath sounds: Normal breath sounds.  Abdominal:     General: There is no distension.     Palpations: Abdomen is soft.     Tenderness: There is no abdominal tenderness.  Lymphadenopathy:     Cervical: No cervical adenopathy.  Skin:    General: Skin is warm and dry.     Findings: No rash.  Neurological:     Mental Status: He is alert and oriented to person, place, and time.     Lab Results Lab Results  Component Value  Date   WBC 7.9 05/13/2019   HGB 7.1 (L) 05/13/2019   HCT 23.1 (L) 05/13/2019   MCV 105.0 (H) 05/13/2019   PLT 122 (L) 05/13/2019    Lab Results  Component Value Date   CREATININE 7.00 (H) 05/13/2019   BUN 48 (H) 05/13/2019   NA 135 05/13/2019   K 3.2 (L) 05/13/2019   CL 94 (L) 05/13/2019   CO2 22 05/13/2019    Lab Results  Component Value Date   ALT 7 04/29/2019   AST 17 04/29/2019   ALKPHOS 63 04/29/2019   BILITOT 1.3 (H) 04/29/2019     Microbiology: Recent Results (from the past 240 hour(s))  SARS CORONAVIRUS 2 (TAT 6-24 HRS) Nasopharyngeal Nasopharyngeal Swab     Status: None   Collection Time: 05/04/19  6:45 PM   Specimen: Nasopharyngeal Swab  Result Value Ref Range Status   SARS Coronavirus 2 NEGATIVE NEGATIVE Final    Comment: (NOTE) SARS-CoV-2 target nucleic acids are NOT DETECTED. The SARS-CoV-2 RNA is generally detectable in upper and lower respiratory specimens during the acute phase of infection. Negative results do not preclude SARS-CoV-2 infection, do not rule out co-infections with other pathogens, and should not be used as the sole basis for treatment or other patient management decisions. Negative results must be combined with clinical observations, patient history, and epidemiological information. The expected result is Negative. Fact Sheet for Patients: SugarRoll.be Fact Sheet for Healthcare Providers: https://www.woods-mathews.com/ This test is not yet approved or cleared by the [place] FDA and  has been authorized for detection and/or diagnosis of SARS-CoV-2 by FDA under an Emergency Use Authorization (EUA). This EUA will remain  in effect (meaning this test can be used) for the duration of the COVID-19 declaration under Section 56 4(b)(1) of the Act, 21 U.S.C. section 360bbb-3(b)(1), unless the authorization is terminated or revoked sooner. Performed at [facility], [place] [address],  [place], [place] [address]   Culture, blood (Routine X 2) w Reflex to ID Panel     Status: Abnormal (Preliminary result)   Collection Time: 05/11/19  2:20 PM   Specimen: BLOOD  Result Value Ref Range Status   Specimen Description BLOOD LEFT ANTECUBITAL  Final   Special Requests   Final    BOTTLES DRAWN AEROBIC AND ANAEROBIC Blood Culture adequate volume   Culture  Setup Time   Final    GRAM POSITIVE COCCI IN PAIRS IN CHAINS IN BOTH AEROBIC AND ANAEROBIC BOTTLES CRITICAL RESULT CALLED TO, READ BACK BY AND VERIFIED WITH: Salli Real [id number] 05/12/2019 Mena Goes Performed at [facility], [place] [address] [place], [place] [address]    Culture ENTEROCOCCUS  FAECALIS (A)  Final   Report Status PENDING  Incomplete  Blood Culture ID Panel (Reflexed)     Status: Abnormal   Collection Time: 05/11/19  2:20 PM  Result Value Ref Range Status   Enterococcus species DETECTED (A) NOT DETECTED Final    Comment: CRITICAL RESULT CALLED TO, READ BACK BY AND VERIFIED WITH: G. ABBOTT,PHARMD [id number] 05/12/2019 T. TYSOR    Vancomycin resistance NOT DETECTED NOT DETECTED Final   Listeria monocytogenes NOT DETECTED NOT DETECTED Final   Staphylococcus species NOT DETECTED NOT DETECTED Final   Staphylococcus aureus (BCID) NOT DETECTED NOT DETECTED Final   Streptococcus species NOT DETECTED NOT DETECTED Final   Streptococcus agalactiae NOT DETECTED NOT DETECTED Final   Streptococcus pneumoniae NOT DETECTED NOT DETECTED Final   Streptococcus pyogenes NOT DETECTED NOT DETECTED Final   Acinetobacter baumannii NOT DETECTED NOT DETECTED Final   Enterobacteriaceae species NOT DETECTED NOT DETECTED Final   Enterobacter cloacae complex NOT DETECTED NOT DETECTED Final   Escherichia coli NOT DETECTED NOT DETECTED Final   Klebsiella oxytoca NOT DETECTED NOT DETECTED Final   Klebsiella pneumoniae NOT DETECTED NOT DETECTED Final   Proteus species NOT DETECTED NOT DETECTED Final   Serratia marcescens NOT DETECTED NOT  DETECTED Final   Haemophilus influenzae NOT DETECTED NOT DETECTED Final   Neisseria meningitidis NOT DETECTED NOT DETECTED Final   Pseudomonas aeruginosa NOT DETECTED NOT DETECTED Final   Candida albicans NOT DETECTED NOT DETECTED Final   Candida glabrata NOT DETECTED NOT DETECTED Final   Candida krusei NOT DETECTED NOT DETECTED Final   Candida parapsilosis NOT DETECTED NOT DETECTED Final   Candida tropicalis NOT DETECTED NOT DETECTED Final    Comment: Performed at [facility], [address] [place], [place] [address]  Culture, blood (Routine X 2) w Reflex to ID Panel     Status: Abnormal (Preliminary result)   Collection Time: 05/11/19  2:28 PM   Specimen: BLOOD  Result Value Ref Range Status   Specimen Description BLOOD LEFT ANTECUBITAL  Final   Special Requests   Final    BOTTLES DRAWN AEROBIC AND ANAEROBIC Blood Culture results may not be optimal due to an inadequate volume of blood received in culture bottles   Culture  Setup Time   Final    GRAM POSITIVE COCCI IN PAIRS IN CHAINS IN BOTH AEROBIC AND ANAEROBIC BOTTLES CRITICAL RESULT CALLED TO, READ BACK BY AND VERIFIED WITH: Salli Real [id number] 05/12/2019 Mena Goes Performed at [facility], [place] [address], [place], [place] [address]    Culture ENTEROCOCCUS FAECALIS (A)  Final   Report Status PENDING  Incomplete    Janene Madeira, MSN, NP-C [place] [facility]ious [facility] [email or web address] Pager: [phone] Office: [phone] [place]: [phone]

## 2019-05-13 NOTE — Plan of Care (Signed)
Continue to monitor

## 2019-05-13 NOTE — Progress Notes (Signed)
    [facility] has been requested to perform a transesophageal echocardiogram on Jose Garrett for bacteremia.  After careful review of history and examination, the risks and benefits of transesophageal echocardiogram have been explained including risks of esophageal damage, perforation (1:10,000 risk), bleeding, pharyngeal hematoma as well as other potential complications associated with conscious sedation including aspiration, arrhythmia, respiratory failure and death. Alternatives to treatment were discussed, questions were answered. Patient is willing to proceed.   Jozie Wulf Ninfa Meeker, PA-C  05/13/2019 4:48 PM

## 2019-05-13 NOTE — Progress Notes (Signed)
  Progress Note    05/13/2019 8:17 AM 6 Days Post-Op  Subjective:  Says he feels slightly better but was a little nauseated   Afebrile x 24 hrs  Vitals:   05/13/19 0730 05/13/19 0800  BP: (!) 127/58 (!) 132/59  Pulse: (!) 56 (!) 59  Resp:    Temp:    SpO2:      Physical Exam: General :  Resting comfortably on HD Lungs:  Non labored Incisions:  Stump dressing not removed; it is clean and dry    CBC    Component Value Date/Time   WBC 11.3 (H) 05/12/2019 0256   RBC 2.38 (L) 05/12/2019 0256   HGB 7.6 (L) 05/12/2019 0256   HGB 11.4 (L) 11/12/2018 1637   HGB 10.8 (L) 03/14/2017 0921   HCT 25.2 (L) 05/12/2019 0256   HCT 35.0 (L) 11/12/2018 1637   HCT 32.5 (L) 03/14/2017 0921   PLT 130 (L) 05/12/2019 0256   PLT 51 (LL) 11/12/2018 1637   MCV 105.9 (H) 05/12/2019 0256   MCV 103 (H) 11/12/2018 1637   MCV 106.2 (H) 03/14/2017 0921   MCH 31.9 05/12/2019 0256   MCHC 30.2 05/12/2019 0256   RDW 14.3 05/12/2019 0256   RDW 14.6 11/12/2018 1637   RDW 14.6 03/14/2017 0921   LYMPHSABS 0.6 (L) 05/12/2019 0256   LYMPHSABS 1.0 11/12/2018 1637   LYMPHSABS 0.8 (L) 03/14/2017 0921   MONOABS 0.7 05/12/2019 0256   MONOABS 0.6 03/14/2017 0921   EOSABS 0.1 05/12/2019 0256   EOSABS 0.2 11/12/2018 1637   BASOSABS 0.1 05/12/2019 0256   BASOSABS 0.0 11/12/2018 1637   BASOSABS 0.0 03/14/2017 0921    BMET    Component Value Date/Time   NA 135 05/13/2019 0734   NA 135 07/14/2018 0909   NA 141 03/14/2017 0921   K 3.2 (L) 05/13/2019 0734   K 4.1 03/14/2017 0921   CL 94 (L) 05/13/2019 0734   CO2 22 05/13/2019 0734   CO2 33 (H) 03/14/2017 0921   GLUCOSE 92 05/13/2019 0734   GLUCOSE 144 (H) 03/14/2017 0921   BUN 48 (H) 05/13/2019 0734   BUN 76 (HH) 07/14/2018 0909   BUN 30.1 (H) 03/14/2017 0921   CREATININE 7.00 (H) 05/13/2019 0734   CREATININE 9.16 (HH) 09/17/2017 0954   CREATININE 8.1 (HH) 03/14/2017 0921   CALCIUM 8.0 (L) 05/13/2019 0734   CALCIUM 9.5 03/14/2017 0921   GFRNONAA  7 (L) 05/13/2019 0734   GFRNONAA 5 (L) 09/17/2017 0954   GFRNONAA 24 (L) 06/30/2014 1528   GFRAA 9 (L) 05/13/2019 0734   GFRAA 6 (L) 09/17/2017 0954   GFRAA 28 (L) 06/30/2014 1528    INR    Component Value Date/Time   INR 1.2 04/29/2019 0713   INR 1.10 (L) 01/21/2015 0809     Intake/Output Summary (Last 24 hours) at 05/13/2019 0817 Last data filed at 05/13/2019 0313 Gross per 24 hour  Intake 720 ml  Output --  Net 720 ml     Assessment:  69 y.o. male is s/p:  below knee amputation    6 Days Post-Op  Plan: -pt seen in HD and dressing on stump not removed.   -pt with + blood cx with Enterococcus-IV ampicillin started yesterday.  Appreciate ID assistance.  2D echo ordered and repeat blood cx for tomorrow ordered -DVT prophylaxis:  Sq heparin   Leontine Locket, PA-C [facility] [phone] 05/13/2019 8:17 AM

## 2019-05-14 ENCOUNTER — Inpatient Hospital Stay (HOSPITAL_COMMUNITY): Payer: Medicare Other

## 2019-05-14 ENCOUNTER — Inpatient Hospital Stay (HOSPITAL_COMMUNITY): Payer: Medicare Other | Admitting: Anesthesiology

## 2019-05-14 ENCOUNTER — Encounter (HOSPITAL_COMMUNITY): Payer: Self-pay | Admitting: Vascular Surgery

## 2019-05-14 ENCOUNTER — Encounter (HOSPITAL_COMMUNITY)
Admission: AD | Disposition: A | Discharge status: Rehabilitation Facility | Payer: Self-pay | Source: Home / Self Care | Attending: Vascular Surgery

## 2019-05-14 ENCOUNTER — Ambulatory Visit (HOSPITAL_COMMUNITY): Admit: 2019-05-14 | Payer: Medicare Other | Admitting: Internal Medicine

## 2019-05-14 DIAGNOSIS — Z952 Presence of prosthetic heart valve: Secondary | ICD-10-CM

## 2019-05-14 DIAGNOSIS — I700 Atherosclerosis of aorta: Secondary | ICD-10-CM

## 2019-05-14 DIAGNOSIS — L8990 Pressure ulcer of unspecified site, unspecified stage: Secondary | ICD-10-CM | POA: Insufficient documentation

## 2019-05-14 DIAGNOSIS — R7881 Bacteremia: Secondary | ICD-10-CM

## 2019-05-14 HISTORY — PX: TEE WITHOUT CARDIOVERSION: SHX5443

## 2019-05-14 LAB — RENAL FUNCTION PANEL
Albumin: 2.2 g/dL — ABNORMAL LOW (ref 3.5–5.0)
Anion gap: 16 — ABNORMAL HIGH (ref 5–15)
BUN: 23 mg/dL (ref 8–23)
CO2: 24 mmol/L (ref 22–32)
Calcium: 8.1 mg/dL — ABNORMAL LOW (ref 8.9–10.3)
Chloride: 97 mmol/L — ABNORMAL LOW (ref 98–111)
Creatinine, Ser: 4.93 mg/dL — ABNORMAL HIGH (ref 0.61–1.24)
GFR calc Af Amer: 13 mL/min — ABNORMAL LOW (ref >60)
GFR calc non Af Amer: 11 mL/min — ABNORMAL LOW (ref >60)
Glucose, Bld: 75 mg/dL (ref 70–99)
Phosphorus: 4.4 mg/dL (ref 2.5–4.6)
Potassium: 3.4 mmol/L — ABNORMAL LOW (ref 3.5–5.1)
Sodium: 137 mmol/L (ref 135–145)

## 2019-05-14 LAB — IRON AND TIBC
Iron: 41 ug/dL — ABNORMAL LOW (ref 45–182)
Saturation Ratios: 35 % (ref 17.9–39.5)
TIBC: 118 ug/dL — ABNORMAL LOW (ref 250–450)
UIBC: 77 ug/dL

## 2019-05-14 LAB — CULTURE, BLOOD (ROUTINE X 2): Special Requests: ADEQUATE

## 2019-05-14 SURGERY — ECHOCARDIOGRAM, TRANSESOPHAGEAL
Anesthesia: Monitor Anesthesia Care

## 2019-05-14 MED ORDER — LIDOCAINE 2% (20 MG/ML) 5 ML SYRINGE
INTRAMUSCULAR | Status: DC | PRN
  Administered 2019-05-14: 40 mg via INTRAVENOUS

## 2019-05-14 MED ORDER — PROPOFOL 500 MG/50ML IV EMUL
INTRAVENOUS | Status: DC | PRN
  Administered 2019-05-14: 80 ug/kg/min via INTRAVENOUS

## 2019-05-14 MED ORDER — CHLORHEXIDINE GLUCONATE CLOTH 2 % EX PADS
6.0000 | MEDICATED_PAD | Freq: Every day | CUTANEOUS | Status: DC
  Administered 2019-05-15 – 2019-05-16 (×2): 6 via TOPICAL

## 2019-05-14 MED ORDER — PROPOFOL 10 MG/ML IV BOLUS
INTRAVENOUS | Status: DC | PRN
  Administered 2019-05-14 (×2): 20 mg via INTRAVENOUS

## 2019-05-14 MED ORDER — SODIUM CHLORIDE 0.9 % IV SOLN: INTRAVENOUS | Status: DC

## 2019-05-14 MED ORDER — VANCOMYCIN HCL 750 MG/150ML IV SOLN
750.0000 mg | Freq: Once | INTRAVENOUS | Status: AC
  Administered 2019-05-14: 750 mg via INTRAVENOUS
  Filled 2019-05-14: qty 150

## 2019-05-14 MED ORDER — VANCOMYCIN HCL IN DEXTROSE 750-5 MG/150ML-% IV SOLN
750.0000 mg | INTRAVENOUS | Status: DC
  Filled 2019-05-14: qty 150

## 2019-05-14 MED ORDER — SODIUM CHLORIDE 0.9 % IV SOLN: INTRAVENOUS | Status: DC | PRN

## 2019-05-14 NOTE — Progress Notes (Signed)
Patient off unit to Endo at this time for TEE.

## 2019-05-14 NOTE — CV Procedure (Signed)
TEE  PT sedated by anesthesia with Propofol intravenouslly Throat numbed with viscous lidocaine Mouth guard placed  TEE probe advanced through mouth to mid esophagus without difficulty   TV normal  Trace TR MV is mildly thickened   Trivial MR AV prothesis appeared normal   NO valvular or perivalvular AI PV is difficult to see but there does not appear to be a vegetation.  No significant PI    LVEF and RVEF are normal   Mild fixed plaquing of the thoracic aorta      Full report to follow

## 2019-05-14 NOTE — Progress Notes (Signed)
MOBILITY TEAM - Progress Note   05/14/19 1552  Mobility  Activity Transferred:  Bed to chair  Level of Assistance Moderate assist, patient does 50-74%  Assistive Device Front wheel walker  Distance Ambulated (ft) 2 ft  Mobility Response Tolerated fair  Bed Position  (seated in recliner)   Pt required modA to stand from EOB, unable to hop completely on LLE requiring min-modA for steadying assist; scooted on LLE with RW to recliner. Limb guard removed to assess skin integrity. R knee left resting in extension.  Mabeline Caras, PT, DPT Mobility Team Pager [phone]

## 2019-05-14 NOTE — Anesthesia Procedure Notes (Signed)
Procedure Name: MAC Date/Time: 05/14/2019 11:15 AM Performed by: Amadeo Garnet, CRNA Pre-anesthesia Checklist: Emergency Drugs available, Patient identified, Suction available and Patient being monitored Patient Re-evaluated:Patient Re-evaluated prior to induction Oxygen Delivery Method: Nasal cannula Preoxygenation: Pre-oxygenation with 100% oxygen Induction Type: IV induction Placement Confirmation: positive ETCO2 Dental Injury: Teeth and Oropharynx as per pre-operative assessment

## 2019-05-14 NOTE — Anesthesia Preprocedure Evaluation (Signed)
Anesthesia Evaluation  Patient identified by MRN, date of birth, ID band Patient awake    Reviewed: Allergy & Precautions, NPO status , Patient's Chart, lab work & pertinent test results  Airway Mallampati: I  TM Distance: >3 FB Neck ROM: Full    Dental   Pulmonary former smoker,    Pulmonary exam normal        Cardiovascular hypertension, Pt. on medications + CAD and + Past MI  Normal cardiovascular exam     Neuro/Psych Anxiety    GI/Hepatic GERD  Medicated and Controlled,  Endo/Other    Renal/GU Dialysis and ESRFRenal disease     Musculoskeletal   Abdominal   Peds  Hematology   Anesthesia Other Findings   Reproductive/Obstetrics                             Anesthesia Physical Anesthesia Plan  ASA: III  Anesthesia Plan: MAC   Post-op Pain Management:    Induction: Intravenous  PONV Risk Score and Plan: Treatment may vary due to age or medical condition  Airway Management Planned: Nasal Cannula  Additional Equipment:   Intra-op Plan:   Post-operative Plan:   Informed Consent: I have reviewed the patients History and Physical, chart, labs and discussed the procedure including the risks, benefits and alternatives for the proposed anesthesia with the patient or authorized representative who has indicated his/her understanding and acceptance.       Plan Discussed with: CRNA and Surgeon  Anesthesia Plan Comments:         Anesthesia Quick Evaluation

## 2019-05-14 NOTE — Progress Notes (Signed)
Inpatient Rehabilitation Admissions Coordinator  Continue to await insurance approval for a possible CIR admit. I spoke with Leontine Locket, Pa to request peer to peer with [employer] by 1:15 today per their request. I will follow.  Danne Baxter, RN, MSN Rehab Admissions Coordinator [phone] 05/14/2019 10:04 AM

## 2019-05-14 NOTE — Transfer of Care (Signed)
Immediate Anesthesia Transfer of Care Note  Patient: Jose Garrett  Procedure(s) Performed: TRANSESOPHAGEAL ECHOCARDIOGRAM (TEE) (N/A )  Patient Location: Endoscopy Unit  Anesthesia Type:MAC  Level of Consciousness: awake, alert  and oriented  Airway & Oxygen Therapy: Patient Spontanous Breathing  Post-op Assessment: Report given to RN, Post -op Vital signs reviewed and stable and Patient moving all extremities  Post vital signs: Reviewed and stable  Last Vitals:  Vitals Value Taken Time  BP 110/39 05/14/19 1133  Temp 36.6 C 05/14/19 1133  Pulse    Resp 18 05/14/19 1134  SpO2    Vitals shown include unvalidated device data.  Last Pain:  Vitals:   05/14/19 1133  TempSrc: Oral  PainSc: 0-No pain      Patients Stated Pain Goal: 2 (91/91/66 0600)  Complications: No apparent anesthesia complications

## 2019-05-14 NOTE — Progress Notes (Signed)
Pharmacy Antibiotic Note  Jose Garrett is a 69 y.o. male admitted on 04/29/2019 with Enterococcal bacteremia  Pharmacy has been consulted for vancomycin dosing. Patient's enterococcus is Amp-S but switching to Vanc for ease of dosing as an outpatient.   Noted that patient had a load of 1500 mg of Vancomycin at 1653 and had dialysis on 3/4   Plan: Vancomycin 750 mg now and then post HD MWF Plan 4 weeks of therapy from negative blood cultures   Height: (pt refused bath. ) Weight: 147 lb 14.9 oz (67.1 kg) IBW/kg (Calculated) : 73  Temp (24hrs), Avg:98.1 F (36.7 C), Min:97.8 F (36.6 C), Max:98.9 F (37.2 C)  Recent Labs  Lab 05/08/19 1847 05/11/19 0818 05/12/19 0256 05/13/19 0734 05/13/19 0955 05/14/19 1208  WBC 8.5 10.8* 11.3*  --  7.9  --   CREATININE 6.59* 8.45*  --  7.00*  --  4.93*    Estimated Creatinine Clearance: 13.8 mL/min (A) (by C-G formula based on SCr of 4.93 mg/dL (H)).    No Known Allergies    Thank you for allowing pharmacy to be a part of this patient's care.  Jimmy Footman, PharmD, BCPS, BCIDP Infectious Diseases Clinical Pharmacist Phone: [phone] 05/14/2019 4:45 PM

## 2019-05-14 NOTE — Progress Notes (Signed)
OT Cancellation Note  Patient Details Name: Jose Garrett MRN: [id number] DOB: 1950/06/29   Cancelled Treatment:     Attempt to see patient this PM however reports just got back from a procedure and "I feel druggy," politely declines OT. Will re-attempt as schedule permits.  [place] OT office: [place] 05/14/2019, 12:43 PM

## 2019-05-14 NOTE — Plan of Care (Signed)
Continue to monitor

## 2019-05-14 NOTE — Progress Notes (Signed)
Patient back from having TEE at this time. V/s and assessment complete. Will continue to monitor.

## 2019-05-14 NOTE — Progress Notes (Addendum)
Progress Note    05/14/2019 7:14 AM 7 Days Post-Op  Subjective:  Sitting in bed reading (on bedpan).  No complaints  Afebrile x 24 hrs HR 50's-60's NSR 235'T-614'E systolic 31% RA  Vitals:   05/13/19 1944 05/14/19 0401  BP: (!) 134/53 (!) 129/58  Pulse: 67 61  Resp: 16 20  Temp: 98.3 F (36.8 C) 97.8 F (36.6 C)  SpO2: 98% 98%    Physical Exam: General:  No distress Lungs:  Non labored Incisions:  Right groin is clean and looks good;  Extremities:  Right BKA dressing intact  CBC    Component Value Date/Time   WBC 7.9 05/13/2019 0955   RBC 2.20 (L) 05/13/2019 0955   HGB 7.1 (L) 05/13/2019 0955   HGB 11.4 (L) 11/12/2018 1637   HGB 10.8 (L) 03/14/2017 0921   HCT 23.1 (L) 05/13/2019 0955   HCT 35.0 (L) 11/12/2018 1637   HCT 32.5 (L) 03/14/2017 0921   PLT 122 (L) 05/13/2019 0955   PLT 51 (LL) 11/12/2018 1637   MCV 105.0 (H) 05/13/2019 0955   MCV 103 (H) 11/12/2018 1637   MCV 106.2 (H) 03/14/2017 0921   MCH 32.3 05/13/2019 0955   MCHC 30.7 05/13/2019 0955   RDW 14.3 05/13/2019 0955   RDW 14.6 11/12/2018 1637   RDW 14.6 03/14/2017 0921   LYMPHSABS 0.6 (L) 05/12/2019 0256   LYMPHSABS 1.0 11/12/2018 1637   LYMPHSABS 0.8 (L) 03/14/2017 0921   MONOABS 0.7 05/12/2019 0256   MONOABS 0.6 03/14/2017 0921   EOSABS 0.1 05/12/2019 0256   EOSABS 0.2 11/12/2018 1637   BASOSABS 0.1 05/12/2019 0256   BASOSABS 0.0 11/12/2018 1637   BASOSABS 0.0 03/14/2017 0921    BMET    Component Value Date/Time   NA 135 05/13/2019 0734   NA 135 07/14/2018 0909   NA 141 03/14/2017 0921   K 3.2 (L) 05/13/2019 0734   K 4.1 03/14/2017 0921   CL 94 (L) 05/13/2019 0734   CO2 22 05/13/2019 0734   CO2 33 (H) 03/14/2017 0921   GLUCOSE 92 05/13/2019 0734   GLUCOSE 144 (H) 03/14/2017 0921   BUN 48 (H) 05/13/2019 0734   BUN 76 (HH) 07/14/2018 0909   BUN 30.1 (H) 03/14/2017 0921   CREATININE 7.00 (H) 05/13/2019 0734   CREATININE 9.16 (HH) 09/17/2017 0954   CREATININE 8.1 (HH)  03/14/2017 0921   CALCIUM 8.0 (L) 05/13/2019 0734   CALCIUM 9.5 03/14/2017 0921   GFRNONAA 7 (L) 05/13/2019 0734   GFRNONAA 5 (L) 09/17/2017 0954   GFRNONAA 24 (L) 06/30/2014 1528   GFRAA 9 (L) 05/13/2019 0734   GFRAA 6 (L) 09/17/2017 0954   GFRAA 28 (L) 06/30/2014 1528    INR    Component Value Date/Time   INR 1.2 04/29/2019 0713   INR 1.10 (L) 01/21/2015 0809     Intake/Output Summary (Last 24 hours) at 05/14/2019 0714 Last data filed at 05/13/2019 1056 Gross per 24 hour  Intake --  Output 1500 ml  Net -1500 ml     Assessment:  69 y.o. male is s/p:  Right BKA  7 Days Post-Op  Plan: -pt on bedpan-he has knee immobilizer and dressing in place that I did not remove.  His right groin looks great and no evidence of infection  -pt has +blood cx with enterococcus-on ampicillin.  Blood cx x 2 ordered for today -pt going for TEE later this am. -DVT prophylaxis:  Sq heparin  -hgb down yesterday-pt tolerating.  Will check  CBC in am.   Leontine Locket, PA-C [facility] [phone] 05/14/2019 7:14 AM  I have independently interviewed and examined patient and agree with PA assessment and plan above. Plan for TEE today at ID discretion. Hopefully CIR if echo clear.   Lus Kriegel C. Donzetta Matters, MD [facility] [place] Office: [phone] Pager: [phone]

## 2019-05-14 NOTE — Progress Notes (Signed)
Physical Therapy Treatment Note  Patient continues to make progress toward PT goals and tolerated increased activity this session. Continue to recommend CIR for further skilled PT services.    05/14/19 1138  PT Visit Information  Last PT Received On 05/14/19  Assistance Needed +2 (for safety/gait progression)  History of Present Illness Pt is a 69 y.o. male with recent hx of R LE bypass (04/10/19), now admitted 04/29/19 with progressive gangrenous changes of toes and dorsum of foot. S/p R transmet amputation 2/17. S/p R BKA 2/25. PMH includes aortic stenosis, HTN, HLD, CHF, CAD, PD, anxiety, ESRD (HD MWF). 2-25 pt had BKA.  Precautions  Precautions Fall  Required Braces or Orthoses Knee Immobilizer - Right  Knee Immobilizer - Right On at all times  Restrictions  Weight Bearing Restrictions Yes  RLE Weight Bearing NWB  Pain Assessment  Pain Assessment Faces  Faces Pain Scale 4  Pain Location R residual limb   Pain Descriptors / Indicators Guarding;Grimacing (phantom pain)  Pain Intervention(s) Limited activity within patient's tolerance;Monitored during session;Repositioned  Cognition  Arousal/Alertness Awake/alert  Behavior During Therapy WFL for tasks assessed/performed  Overall Cognitive Status Within Functional Limits for tasks assessed  Bed Mobility  Overal bed mobility Needs Assistance  Bed Mobility Supine to Sit  Supine to sit Min guard;HOB elevated  General bed mobility comments increased time and effort; use of rail  Transfers  Overall transfer level Needs assistance  Equipment used Rolling walker (2 wheeled)  Transfers Sit to/from Stand  Sit to Stand Mod assist  General transfer comment assist to power up into standing from EOB and recliner; cues for hand placement  Ambulation/Gait  Ambulation/Gait assistance Min assist;+2 safety/equipment  Gait Distance (Feet) 30 Feet then 20 ft with seated rest break  Assistive device Rolling walker (2 wheeled)  Gait  Pattern/deviations Step-to pattern  General Gait Details cues for sequencing/upright posture; assist to steady and guide RW; L shoe donned and pt with improved step length and foot clearance  Gait velocity decreased  Balance  Overall balance assessment Needs assistance  Sitting-balance support No upper extremity supported;Feet supported  Sitting balance-Leahy Scale Good  Standing balance support Bilateral upper extremity supported;During functional activity  Standing balance-Leahy Scale Poor  PT - End of Session  Equipment Utilized During Treatment Gait belt  Activity Tolerance Patient tolerated treatment well  Patient left with call bell/phone within reach;in bed;Other (comment) (pt sitting EOB)  Nurse Communication Mobility status   PT - Assessment/Plan  PT Plan Current plan remains appropriate  PT Visit Diagnosis Other abnormalities of gait and mobility (R26.89);Muscle weakness (generalized) (M62.81);Difficulty in walking, not elsewhere classified (R26.2);Pain  Pain - Right/Left Right  Pain - part of body Leg  PT Frequency (ACUTE ONLY) Min 3X/week  Follow Up Recommendations CIR;Supervision/Assistance - 24 hour  PT equipment None recommended by PT  AM-PAC PT "6 Clicks" Mobility Outcome Measure (Version 2)  Help needed turning from your back to your side while in a flat bed without using bedrails? 3  Help needed moving from lying on your back to sitting on the side of a flat bed without using bedrails? 3  Help needed moving to and from a bed to a chair (including a wheelchair)? 3  Help needed standing up from a chair using your arms (e.g., wheelchair or bedside chair)? 2  Help needed to walk in hospital room? 2  Help needed climbing 3-5 steps with a railing?  1  6 Click Score 14  Consider Recommendation of Discharge To:  CIR/SNF/LTACH  PT Goal Progression  Progress towards PT goals Progressing toward goals  PT Time Calculation  PT Start Time (ACUTE ONLY) 4835  PT Stop Time (ACUTE  ONLY) 0854  PT Time Calculation (min) (ACUTE ONLY) 16 min  PT General Charges  $$ ACUTE PT VISIT 1 Visit  PT Treatments  $Gait Training 8-22 mins   Earney Navy, PTA Acute Rehabilitation Services Pager: [phone] Office: [phone]

## 2019-05-14 NOTE — Progress Notes (Signed)
Inpatient Rehabilitation Admissions Coordinator  I have received a denial from [facility] for [facility] Medicare to approve an inpt rehab admit. I have initiated a Fast appeal on behalf of the patient. I await the appeal decision.  Danne Baxter, RN, MSN Rehab Admissions Coordinator [phone] 05/14/2019 4:07 PM

## 2019-05-14 NOTE — Anesthesia Postprocedure Evaluation (Signed)
Anesthesia Post Note  Patient: JAI STEIL  Procedure(s) Performed: TRANSESOPHAGEAL ECHOCARDIOGRAM (TEE) (N/A )     Patient location during evaluation: PACU Anesthesia Type: MAC Level of consciousness: awake and alert Pain management: pain level controlled Vital Signs Assessment: post-procedure vital signs reviewed and stable Respiratory status: spontaneous breathing, nonlabored ventilation, respiratory function stable and patient connected to nasal cannula oxygen Cardiovascular status: stable and blood pressure returned to baseline Postop Assessment: no apparent nausea or vomiting Anesthetic complications: no    Last Vitals:  Vitals:   05/14/19 1156 05/14/19 1212  BP: (!) 144/55   Pulse:  62  Resp: 15   Temp: 36.6 C   SpO2:      Last Pain:  Vitals:   05/14/19 1156  TempSrc: Oral  PainSc:                  Chloris Marcoux DAVID

## 2019-05-14 NOTE — Progress Notes (Signed)
*  PRELIMINARY RESULTS* Echocardiogram Echocardiogram Transesophageal has been performed.  Jose Garrett 05/14/2019, 11:36 AM

## 2019-05-14 NOTE — Progress Notes (Addendum)
[facility] Progress Note   Subjective:   Patient seen in room. Still fatigued but feeling better this AM. Denies SOB, cough, CP, palpitations, dizziness, abdominal pain, N/V/D. Planned for TEE today. Requests shorter HD tomorrow if possible.  Objective Vitals:   05/14/19 0820 05/14/19 0833 05/14/19 0910 05/14/19 0915  BP:  (!) 151/55    Pulse:  60    Resp: 15 16 18 17   Temp:  97.8 F (36.6 C)    TempSrc:  Oral    SpO2:  100%    Weight:      Height:       Physical Exam General: Well developed male, alert and in NAD Heart: RRR, no murmurs, rubs or gallops Lungs: CTA bilaterally without wheezing, rhonchi or rales Abdomen: Soft, non-tender, non-distended. +BS Extremities: no LE edema, R BKA in stump protector Dialysis Access: R forearm AVF with mild bruising, no edema, + bruit  Additional Objective Labs: Basic Metabolic Panel: Recent Labs  Lab 05/08/19 1847 05/11/19 0818 05/13/19 0734  NA 134* 135 135  K 3.7 4.2 3.2*  CL 92* 94* 94*  CO2 24 22 22   GLUCOSE 118* 87 92  BUN 56* 56* 48*  CREATININE 6.59* 8.45* 7.00*  CALCIUM 8.6* 8.3* 8.0*  PHOS 6.7* 7.1* 6.6*   Liver Function Tests: Recent Labs  Lab 05/08/19 1847 05/11/19 0818 05/13/19 0734  ALBUMIN 2.1* 2.3* 2.0*   CBC: Recent Labs  Lab 05/08/19 1847 05/08/19 1847 05/11/19 0818 05/12/19 0256 05/13/19 0955  WBC 8.5   < > 10.8* 11.3* 7.9  NEUTROABS  --   --   --  9.3*  --   HGB 7.5*   < > 8.1* 7.6* 7.1*  HCT 24.0*   < > 25.6* 25.2* 23.1*  MCV 105.3*  --  103.6* 105.9* 105.0*  PLT 160   < > 153 130* 122*   < > = values in this interval not displayed.   Blood Culture    Component Value Date/Time   SDES BLOOD LEFT ANTECUBITAL 05/11/2019 1428   SPECREQUEST  05/11/2019 1428    BOTTLES DRAWN AEROBIC AND ANAEROBIC Blood Culture results may not be optimal due to an inadequate volume of blood received in culture bottles   CULT ENTEROCOCCUS FAECALIS (A) 05/11/2019 1428   REPTSTATUS 05/14/2019  FINAL 05/11/2019 1428    CBG: Recent Labs  Lab 05/08/19 2230  GLUCAP 86    Studies/Results: ECHOCARDIOGRAM COMPLETE  Result Date: 05/13/2019    ECHOCARDIOGRAM REPORT   Patient Name:   DAVEN MONTZ Date of Exam: 05/13/2019 Medical Rec #:  [id number]      Height:       70.0 in Accession #:    [id number]     Weight:       151.0 lb Date of Birth:  September 12, 1950      BSA:          1.852 m Patient Age:    69 years       BP:           127/58 mmHg Patient Gender: M              HR:           63 bpm. Exam Location:  Inpatient Procedure: 2D Echo, Cardiac Doppler and Color Doppler Indications:    Bacteremia [790.7.ICD-9-CM]  History:        Patient has prior history of Echocardiogram examinations, most  recent 11/11/2018. CAD; Risk Factors:Former Smoker. PAD.                 Aortic Valve: 26 mm Edwards Sapien prosthetic, stented (TAVR)                 valve is present in the aortic position. Procedure Date:                 10/21/2018.  Sonographer:    Vickie Epley RDCS Referring Phys: [id number] [place]  1. Left ventricular ejection fraction, by estimation, is 55%. The left ventricle has normal function. The left ventricle demonstrates regional wall motion abnormalities (see scoring diagram/findings for description). There is mild left ventricular hypertrophy. Left ventricular diastolic parameters are consistent with Grade I diastolic dysfunction (impaired relaxation).  2. Right ventricular systolic function is normal. The right ventricular size is normal.  3. Left atrial size was mildly dilated.  4. The mitral valve is moderately thickened/calcified but no obvious vegetation. Hwever TTE is insensitive for endocarditis. If clinic suspicion is high, recommend TEE. The mitral valve is abnormal. No evidence of mitral valve regurgitation.  5. The aortic valve is normal in structure and function. Aortic valve regurgitation is trivial. There is a 26 mm Edwards Sapien prosthetic (TAVR) valve present  in the aortic position. Procedure Date: 10/21/2018. No obvious vegetation seen.  6. The inferior vena cava is normal in size with greater than 50% respiratory variability, suggesting right atrial pressure of 3 mmHg. FINDINGS  Left Ventricle: Left ventricular ejection fraction, by estimation, is 55%. The left ventricle has normal function. The left ventricle demonstrates regional wall motion abnormalities. Moderate hypokinesis of the left ventricular, basal-mid inferior wall and inferolateral wall. The left ventricular internal cavity size was normal in size. There is mild left ventricular hypertrophy. Left ventricular diastolic parameters are consistent with Grade I diastolic dysfunction (impaired relaxation).  LV Wall Scoring: The basal inferior segment is hypokinetic. Right Ventricle: The right ventricular size is normal. No increase in right ventricular wall thickness. Right ventricular systolic function is normal. Left Atrium: Left atrial size was mildly dilated. Right Atrium: Right atrial size was normal in size. Pericardium: There is no evidence of pericardial effusion. Mitral Valve: The mitral valve is moderately thickened/calcified but no obvious vegetation. Hwever TTE is insensitive for endocarditis. If clinic suspicion is high, recommend TEE. The mitral valve is abnormal. There is moderate thickening of the mitral valve leaflet(s). No evidence of mitral valve regurgitation. Tricuspid Valve: The tricuspid valve is normal in structure. Tricuspid valve regurgitation is trivial. Aortic Valve: The aortic valve is normal in structure and function. Aortic valve regurgitation is trivial. Aortic valve mean gradient measures 11.0 mmHg. Aortic valve peak gradient measures 25.6 mmHg. There is a 26 mm Edwards Sapien prosthetic, stented (TAVR) valve present in the aortic position. Procedure Date: 10/21/2018. Pulmonic Valve: The pulmonic valve was normal in structure. Pulmonic valve regurgitation is trivial. Aorta: The  aortic root and ascending aorta are structurally normal, with no evidence of dilitation. Venous: The inferior vena cava is normal in size with greater than 50% respiratory variability, suggesting right atrial pressure of 3 mmHg. IAS/Shunts: No atrial level shunt detected by color flow Doppler.  LEFT VENTRICLE PLAX 2D LVIDd:         4.90 cm      Diastology LVIDs:         3.50 cm      LV e' lateral:   7.97 cm/s LV PW:  1.20 cm      LV E/e' lateral: 12.8 LV IVS:        1.20 cm      LV e' medial:    4.64 cm/s                             LV E/e' medial:  22.0  LV Volumes (MOD) LV vol d, MOD A2C: 173.0 ml LV vol d, MOD A4C: 157.0 ml LV vol s, MOD A2C: 91.7 ml LV vol s, MOD A4C: 73.8 ml LV SV MOD A2C:     81.3 ml LV SV MOD A4C:     157.0 ml LV SV MOD BP:      81.0 ml RIGHT VENTRICLE RV S prime:     9.55 cm/s TAPSE (M-mode): 1.9 cm LEFT ATRIUM           Index       RIGHT ATRIUM           Index LA diam:      3.30 cm 1.78 cm/m  RA Area:     12.60 cm LA Vol (A2C): 47.4 ml 25.59 ml/m RA Volume:   30.10 ml  16.25 ml/m LA Vol (A4C): 43.5 ml 23.48 ml/m  AORTIC VALVE AV Vmax:           253.00 cm/s AV Vmean:          148.000 cm/s AV VTI:            0.456 m AV Peak Grad:      25.6 mmHg AV Mean Grad:      11.0 mmHg LVOT Vmax:         150.00 cm/s LVOT Vmean:        80.200 cm/s LVOT VTI:          0.236 m LVOT/AV VTI ratio: 0.52 MITRAL VALVE MV Area (PHT): 2.01 cm     SHUNTS MV Decel Time: 377 msec     Systemic VTI: 0.24 m MV E velocity: 102.00 cm/s MV A velocity: 116.00 cm/s MV E/A ratio:  0.88 Glori Bickers MD Electronically signed by Glori Bickers MD Signature Date/Time: 05/13/2019/7:16:49 PM    Final    Medications: . sodium chloride    . sodium chloride    . ampicillin (OMNIPEN) IV 2 g (05/14/19 0430)  . magnesium sulfate bolus IVPB     . amiodarone  200 mg Oral Daily  . atorvastatin  80 mg Oral Q supper  . calcitRIOL  1.5 mcg Oral Q M,W,F-HD  . carvedilol  3.125 mg Oral BID  . Chlorhexidine Gluconate  Cloth  6 each Topical Q0600  . clopidogrel  75 mg Oral Q supper  . darbepoetin (ARANESP) injection - DIALYSIS  100 mcg Intravenous Q Fri-HD  . docusate sodium  100 mg Oral Daily  . heparin  5,000 Units Subcutaneous Q8H  . midodrine  10 mg Oral Q M,W,F-HD  . multivitamin  1 tablet Oral QHS  . pantoprazole  40 mg Oral Daily  . sodium chloride flush  3 mL Intravenous Q12H  . sucroferric oxyhydroxide  1,500 mg Oral TID WC  . vitamin B-12  1,000 mcg Oral QPM    Dialysis Orders: MWF at [facility] 4hr, 400/800, EDW 76.5kg, 2K/2Ca, UFP #4, AVF, -Heparin 2400units IV TIW - Parsabiv 5mg  IV q HD - Calcitriol 1.6mcg PO q HD - Mircera 16mcg IV q 2 weeks (last given 25mcg on 1/20, Hgb 10.3  on 2/10)  Assessment/Plan: 1. PAD with gangrenous changes:S/pR TMA on 2/17-> poor healing ->s/p R BKA 2/25.VVS following. 2. Sepsis:+blood cx with enteroccous, on ampicillin and improving. Unclear source, incision sites clean per VVS, CXR without active dz, no urine available to culture.ID following and will need TEE to evaluate TAVR. 3. ESRD:On MWF schedule,next HD 3/6. Last K+ 3.2 yesterday > changed to 4K bath. Will repeat K+, use 4K bath tomorrow. Pt requests shorter HD tomorrow, will plan 3.5 hours.  4. Hypertension/volume:BP controlled, no edema. On midodrine pre-HD.EDW will be lowered on d/cafter amputation. 5. Anemia:Hgbdown to 7.1withpost-op drop. On vitamin B12. ContinueAranesp q Friday. Will check iron studies. Transfuse PRN. 6. Metabolic bone disease:Corrected calcium 9.6,Phos high- velphoro increased 3/2.Continue outpatient calcitriol. Parsabiv not on hospital formulary. 7. Hx TAVR 8. CAD/HFrEF 9. Dispo: pending CIR  Anice Paganini, PA-C 05/14/2019, 10:16 AM  [facility] Pager: [phone]  Nephrology attending: Patient was seen and examined.  Chart and lab results reviewed.  I agree with assessment and plan as outlined above. ESRD on HD. The blood  culture is growing Enterococcus in both bottles.  ID is following, on ampicillin, getting TEE today.  Next HD on Friday.  Katheran James, MD [facility].

## 2019-05-15 DIAGNOSIS — R197 Diarrhea, unspecified: Secondary | ICD-10-CM

## 2019-05-15 LAB — RENAL FUNCTION PANEL
Albumin: 2.2 g/dL — ABNORMAL LOW (ref 3.5–5.0)
Anion gap: 19 — ABNORMAL HIGH (ref 5–15)
BUN: 32 mg/dL — ABNORMAL HIGH (ref 8–23)
CO2: 22 mmol/L (ref 22–32)
Calcium: 8 mg/dL — ABNORMAL LOW (ref 8.9–10.3)
Chloride: 96 mmol/L — ABNORMAL LOW (ref 98–111)
Creatinine, Ser: 6.22 mg/dL — ABNORMAL HIGH (ref 0.61–1.24)
GFR calc Af Amer: 10 mL/min — ABNORMAL LOW (ref >60)
GFR calc non Af Amer: 9 mL/min — ABNORMAL LOW (ref >60)
Glucose, Bld: 122 mg/dL — ABNORMAL HIGH (ref 70–99)
Phosphorus: 5.1 mg/dL — ABNORMAL HIGH (ref 2.5–4.6)
Potassium: 3.2 mmol/L — ABNORMAL LOW (ref 3.5–5.1)
Sodium: 137 mmol/L (ref 135–145)

## 2019-05-15 LAB — CBC
HCT: 24.5 % — ABNORMAL LOW (ref 39.0–52.0)
Hemoglobin: 7.7 g/dL — ABNORMAL LOW (ref 13.0–17.0)
MCH: 32.1 pg (ref 26.0–34.0)
MCHC: 31.4 g/dL (ref 30.0–36.0)
MCV: 102.1 fL — ABNORMAL HIGH (ref 80.0–100.0)
Platelets: 110 10*3/uL — ABNORMAL LOW (ref 150–400)
RBC: 2.4 MIL/uL — ABNORMAL LOW (ref 4.22–5.81)
RDW: 13.9 % (ref 11.5–15.5)
WBC: 6.6 10*3/uL (ref 4.0–10.5)
nRBC: 0 % (ref 0.0–0.2)

## 2019-05-15 LAB — MRSA PCR SCREENING: MRSA by PCR: NEGATIVE

## 2019-05-15 MED ORDER — VANCOMYCIN HCL 750 MG/150ML IV SOLN
750.0000 mg | Freq: Once | INTRAVENOUS | Status: AC
  Administered 2019-05-15: 750 mg via INTRAVENOUS
  Filled 2019-05-15: qty 150

## 2019-05-15 MED ORDER — VANCOMYCIN HCL IN DEXTROSE 750-5 MG/150ML-% IV SOLN
750.0000 mg | INTRAVENOUS | Status: DC
  Filled 2019-05-15 (×2): qty 150

## 2019-05-15 NOTE — Progress Notes (Addendum)
OT Cancellation Note  Patient Details Name: Jose Garrett MRN: [id number] DOB: December 05, 1950   Cancelled Treatment:     Patient reports he "got sick" at dialysis and just received nausea medication, politely asks to try back later. Will re-attempt as schedule allows.  Re-attempt around 1200 for therapy however patient sleeping, asks if we can do therapy tomorrow. Will continue to follow.  [place] OT office: [place] 05/15/2019, 10:56 AM

## 2019-05-15 NOTE — Progress Notes (Addendum)
[facility] Progress Note   Subjective:   Patient seen and examined at bedside in dialysis.  Feeling nauseated today.  Not sure if he will be able to complete treatment today, encouraged compliance.  No vomiting, fever, chills, SOB or CP.   Objective Vitals:   05/14/19 1212 05/14/19 2014 05/15/19 0541 05/15/19 0713  BP:  (!) 130/53  (!) 145/63  Pulse: 62 65  63  Resp:  18  15  Temp:  97.9 F (36.6 C) 97.6 F (36.4 C)   TempSrc:  Oral Oral   SpO2:  98%    Weight:      Height:       Physical Exam General: WDWN male in NAD  Heart:RRR, no mrg Lungs:CTAB, no wheezing, rales or rhonchi Abdomen:soft, NTND Extremities:no LE edema, R BKA dressed  Dialysis Access: R forearm AVF accessed   [facility] Weights   05/11/19 1113 05/13/19 0646 05/13/19 1056  Weight: 67.4 kg 68.5 kg 67.1 kg    Intake/Output Summary (Last 24 hours) at 05/15/2019 0806 Last data filed at 05/15/2019 0400 Gross per 24 hour  Intake 860 ml  Output 0 ml  Net 860 ml    Additional Objective Labs: Basic Metabolic Panel: Recent Labs  Lab 05/11/19 0818 05/13/19 0734 05/14/19 1208  NA 135 135 137  K 4.2 3.2* 3.4*  CL 94* 94* 97*  CO2 22 22 24   GLUCOSE 87 92 75  BUN 56* 48* 23  CREATININE 8.45* 7.00* 4.93*  CALCIUM 8.3* 8.0* 8.1*  PHOS 7.1* 6.6* 4.4   Liver Function Tests: Recent Labs  Lab 05/11/19 0818 05/13/19 0734 05/14/19 1208  ALBUMIN 2.3* 2.0* 2.2*   CBC: Recent Labs  Lab 05/08/19 1847 05/08/19 1847 05/11/19 0818 05/11/19 0818 05/12/19 0256 05/13/19 0955 05/15/19 0217  WBC 8.5   < > 10.8*   < > 11.3* 7.9 6.6  NEUTROABS  --   --   --   --  9.3*  --   --   HGB 7.5*   < > 8.1*   < > 7.6* 7.1* 7.7*  HCT 24.0*   < > 25.6*   < > 25.2* 23.1* 24.5*  MCV 105.3*  --  103.6*  --  105.9* 105.0* 102.1*  PLT 160   < > 153   < > 130* 122* 110*   < > = values in this interval not displayed.   Blood Culture    Component Value Date/Time   SDES BLOOD LEFT ANTECUBITAL 05/11/2019 1428    SPECREQUEST  05/11/2019 1428    BOTTLES DRAWN AEROBIC AND ANAEROBIC Blood Culture results may not be optimal due to an inadequate volume of blood received in culture bottles   CULT ENTEROCOCCUS FAECALIS (A) 05/11/2019 1428   REPTSTATUS 05/14/2019 FINAL 05/11/2019 1428   CBG: Recent Labs  Lab 05/08/19 2230  GLUCAP 86   Iron Studies:  Recent Labs    05/14/19 1208  IRON 41*  TIBC 118*   Lab Results  Component Value Date   INR 1.2 04/29/2019   INR 1.3 (H) 10/16/2018   INR 1.2 07/22/2018   PROTIME 13.2 01/21/2015   Studies/Results: ECHOCARDIOGRAM COMPLETE  Result Date: 05/13/2019    ECHOCARDIOGRAM REPORT   Patient Name:   AMROM ORE Date of Exam: 05/13/2019 Medical Rec #:  [id number]      Height:       70.0 in Accession #:    [id number]     Weight:       151.0  lb Date of Birth:  06/14/1950      BSA:          1.852 m Patient Age:    69 years       BP:           127/58 mmHg Patient Gender: M              HR:           63 bpm. Exam Location:  Inpatient Procedure: 2D Echo, Cardiac Doppler and Color Doppler Indications:    Bacteremia [790.7.ICD-9-CM]  History:        Patient has prior history of Echocardiogram examinations, most                 recent 11/11/2018. CAD; Risk Factors:Former Smoker. PAD.                 Aortic Valve: 26 mm Edwards Sapien prosthetic, stented (TAVR)                 valve is present in the aortic position. Procedure Date:                 10/21/2018.  Sonographer:    Vickie Epley RDCS Referring Phys: [id number] [place]  1. Left ventricular ejection fraction, by estimation, is 55%. The left ventricle has normal function. The left ventricle demonstrates regional wall motion abnormalities (see scoring diagram/findings for description). There is mild left ventricular hypertrophy. Left ventricular diastolic parameters are consistent with Grade I diastolic dysfunction (impaired relaxation).  2. Right ventricular systolic function is normal. The right ventricular  size is normal.  3. Left atrial size was mildly dilated.  4. The mitral valve is moderately thickened/calcified but no obvious vegetation. Hwever TTE is insensitive for endocarditis. If clinic suspicion is high, recommend TEE. The mitral valve is abnormal. No evidence of mitral valve regurgitation.  5. The aortic valve is normal in structure and function. Aortic valve regurgitation is trivial. There is a 26 mm Edwards Sapien prosthetic (TAVR) valve present in the aortic position. Procedure Date: 10/21/2018. No obvious vegetation seen.  6. The inferior vena cava is normal in size with greater than 50% respiratory variability, suggesting right atrial pressure of 3 mmHg. FINDINGS  Left Ventricle: Left ventricular ejection fraction, by estimation, is 55%. The left ventricle has normal function. The left ventricle demonstrates regional wall motion abnormalities. Moderate hypokinesis of the left ventricular, basal-mid inferior wall and inferolateral wall. The left ventricular internal cavity size was normal in size. There is mild left ventricular hypertrophy. Left ventricular diastolic parameters are consistent with Grade I diastolic dysfunction (impaired relaxation).  LV Wall Scoring: The basal inferior segment is hypokinetic. Right Ventricle: The right ventricular size is normal. No increase in right ventricular wall thickness. Right ventricular systolic function is normal. Left Atrium: Left atrial size was mildly dilated. Right Atrium: Right atrial size was normal in size. Pericardium: There is no evidence of pericardial effusion. Mitral Valve: The mitral valve is moderately thickened/calcified but no obvious vegetation. Hwever TTE is insensitive for endocarditis. If clinic suspicion is high, recommend TEE. The mitral valve is abnormal. There is moderate thickening of the mitral valve leaflet(s). No evidence of mitral valve regurgitation. Tricuspid Valve: The tricuspid valve is normal in structure. Tricuspid valve  regurgitation is trivial. Aortic Valve: The aortic valve is normal in structure and function. Aortic valve regurgitation is trivial. Aortic valve mean gradient measures 11.0 mmHg. Aortic valve peak gradient measures 25.6 mmHg. There  is a 26 mm Edwards Sapien prosthetic, stented (TAVR) valve present in the aortic position. Procedure Date: 10/21/2018. Pulmonic Valve: The pulmonic valve was normal in structure. Pulmonic valve regurgitation is trivial. Aorta: The aortic root and ascending aorta are structurally normal, with no evidence of dilitation. Venous: The inferior vena cava is normal in size with greater than 50% respiratory variability, suggesting right atrial pressure of 3 mmHg. IAS/Shunts: No atrial level shunt detected by color flow Doppler.  LEFT VENTRICLE PLAX 2D LVIDd:         4.90 cm      Diastology LVIDs:         3.50 cm      LV e' lateral:   7.97 cm/s LV PW:         1.20 cm      LV E/e' lateral: 12.8 LV IVS:        1.20 cm      LV e' medial:    4.64 cm/s                             LV E/e' medial:  22.0  LV Volumes (MOD) LV vol d, MOD A2C: 173.0 ml LV vol d, MOD A4C: 157.0 ml LV vol s, MOD A2C: 91.7 ml LV vol s, MOD A4C: 73.8 ml LV SV MOD A2C:     81.3 ml LV SV MOD A4C:     157.0 ml LV SV MOD BP:      81.0 ml RIGHT VENTRICLE RV S prime:     9.55 cm/s TAPSE (M-mode): 1.9 cm LEFT ATRIUM           Index       RIGHT ATRIUM           Index LA diam:      3.30 cm 1.78 cm/m  RA Area:     12.60 cm LA Vol (A2C): 47.4 ml 25.59 ml/m RA Volume:   30.10 ml  16.25 ml/m LA Vol (A4C): 43.5 ml 23.48 ml/m  AORTIC VALVE AV Vmax:           253.00 cm/s AV Vmean:          148.000 cm/s AV VTI:            0.456 m AV Peak Grad:      25.6 mmHg AV Mean Grad:      11.0 mmHg LVOT Vmax:         150.00 cm/s LVOT Vmean:        80.200 cm/s LVOT VTI:          0.236 m LVOT/AV VTI ratio: 0.52 MITRAL VALVE MV Area (PHT): 2.01 cm     SHUNTS MV Decel Time: 377 msec     Systemic VTI: 0.24 m MV E velocity: 102.00 cm/s MV A velocity:  116.00 cm/s MV E/A ratio:  0.88 Glori Bickers MD Electronically signed by Glori Bickers MD Signature Date/Time: 05/13/2019/7:16:49 PM    Final    ECHO TEE  Result Date: 05/14/2019    TRANSESOPHOGEAL ECHO REPORT   Patient Name:   TARAS RASK Date of Exam: 05/14/2019 Medical Rec #:  [id number]      Height:       70.0 in Accession #:    [id number]     Weight:       147.9 lb Date of Birth:  [birth year]      BSA:  1.836 m Patient Age:    69 years       BP:           110/39 mmHg Patient Gender: M              HR:           61 bpm. Exam Location:  Inpatient Procedure: Transesophageal Echo, Cardiac Doppler and Color Doppler Indications:     Bacteremia  History:         Patient has prior history of Echocardiogram examinations, most                  recent 05/13/2019. CHF, CAD, PAD, Aortic Valve Disease,                  Signs/Symptoms:Bacteremia; Risk Factors:Hypertension and                  Dyslipidemia. ESRD.                  Aortic Valve: 26 mm Edwards Edwards Sapien prosthetic, stented                  (TAVR) valve is present in the aortic position. Procedure Date:                  10/21/2018.  Sonographer:     Dustin Flock Referring Phys:  [id number] [place] Diagnosing Phys: Dorris Carnes MD PROCEDURE: The transesophogeal probe was passed without difficulty through the esophogus of the patient. Sedation performed by performing physician. The patient was monitored while under deep sedation. Anesthestetic sedation was provided intravenously by  Anesthesiology: 90mg  of Propofol. The patient developed no complications during the procedure. IMPRESSIONS  1. No obvious vegetations.  2. Left ventricular ejection fraction, by estimation, is 55 to 60%. The left ventricle has normal function.  3. Right ventricular systolic function is normal.  4. No left atrial/left atrial appendage thrombus was detected.  5. The aortic valve is grossly normal. Aortic valve regurgitation is not visualized. There is a 26 mm  Edwards Edwards Sapien prosthetic (TAVR) valve present in the aortic position. Procedure Date: 10/21/2018. Echo findings are consistent with normal structure and function of the aortic valve prosthesis. FINDINGS  Left Ventricle: Left ventricular ejection fraction, by estimation, is 55 to 60%. The left ventricle has normal function. The left ventricle has no regional wall motion abnormalities. The left ventricular internal cavity size was normal in size. There is  no left ventricular hypertrophy. Right Ventricle: The right ventricular size is normal. Right vetricular wall thickness was not assessed. Right ventricular systolic function is normal. Left Atrium: Left atrial size was normal in size. No left atrial/left atrial appendage thrombus was detected. Right Atrium: Right atrial size was normal in size. Pericardium: There is no evidence of pericardial effusion. Mitral Valve: The mitral valve is abnormal. There is mild thickening of the mitral valve leaflet(s). Trivial mitral valve regurgitation. Tricuspid Valve: The tricuspid valve is normal in structure. Tricuspid valve regurgitation is trivial. Aortic Valve: The aortic valve is grossly normal. Aortic valve regurgitation is not visualized. There is a 26 mm Edwards Edwards Sapien prosthetic, stented (TAVR) valve present in the aortic position. Procedure Date: 10/21/2018. Echo findings are consistent with normal structure and function of the aortic valve prosthesis. Pulmonic Valve: The pulmonic valve was grossly normal. Pulmonic valve regurgitation is not visualized. Aorta: The aortic root is normal in size and structure. There is mild (Grade II) plaque. IAS/Shunts: No  atrial level shunt detected by color flow Doppler. Dorris Carnes MD Electronically signed by Dorris Carnes MD Signature Date/Time: 05/14/2019/11:33:16 PM    Final     Medications: . sodium chloride    . sodium chloride Stopped (05/14/19 2117)  . magnesium sulfate bolus IVPB    . vancomycin     .  amiodarone  200 mg Oral Daily  . atorvastatin  80 mg Oral Q supper  . calcitRIOL  1.5 mcg Oral Q M,W,F-HD  . carvedilol  3.125 mg Oral BID  . Chlorhexidine Gluconate Cloth  6 each Topical Q0600  . clopidogrel  75 mg Oral Q supper  . darbepoetin (ARANESP) injection - DIALYSIS  100 mcg Intravenous Q Fri-HD  . docusate sodium  100 mg Oral Daily  . heparin  5,000 Units Subcutaneous Q8H  . midodrine  10 mg Oral Q M,W,F-HD  . multivitamin  1 tablet Oral QHS  . pantoprazole  40 mg Oral Daily  . sodium chloride flush  3 mL Intravenous Q12H  . sucroferric oxyhydroxide  1,500 mg Oral TID WC  . vitamin B-12  1,000 mcg Oral QPM    Dialysis Orders: MWF at [facility] 4hr, 400/800, EDW 76.5kg, 2K/2Ca, UFP #4, AVF, -Heparin 2400units IV TIW - Parsabiv 5mg  IV q HD - Calcitriol 1.63mcg PO q HD - Mircera 61mcg IV q 2 weeks (last given 62mcg on 1/20, Hgb 10.3 on 2/10)  Assessment/Plan: 1. PAD w/ gangrenous changes - s/p R TMA on 2/1 healing poorly so s/p R BKA on 2/25.  VVS following.  2. Sepsis: +BC w/enterococcus, on vanomycin & improving. Etiology unclear, incision sites clean per VVS, CXR w/o active dz, no urine available to culture.  ID following.  TEE shows normal appearing AV prothesis with no valvuar or perivalvular AI.  No vegetation. Trivial MR & trace TR.   3. ESRD - on MWF.  HD today per regular schedule.  K 3.2, use ^K bath. Plan for 3.5hours of HD today.  4. Anemia of CKD- Hgb^7.7 today.  ESA due to today.  tsat 35%, no Fe needed.  Transfuse prn.  5. Secondary hyperparathyroidism - Ca 8.0, phos 5.1 w/increased Velphoro dose(3/2), continue binders and VDRA.  6. HTN/volume - Blood pressure variable. Improving with HD.  Does not appear volume overloaded.  Titrate down volume as tolerated.  Will need new dry at d/c.  7. Nutrition - Alb 2.2, Renal diet w/fluid restrictions, protein supplements, Vit 8. Hx TAVR 9. CAD/HFrEF 9. Dispo: admitted to [place], PA-[facility] Pager: [phone] 05/15/2019,8:06 AM  LOS: 16 days    Nephrology attending: Patient was seen and examined. Chart and lab results reviewed. I agree with assessment and plan as outlined above. ESRD on HD.The blood culture is growing Enterococcus in both bottles.  Antibiotics changed to vancomycin for total 4 weeks after the last negative culture, dosing by pharmacist.  TEE with no vegetation.  Dialysis today and tolerated well. Plan to discharge to CIR noted.  Katheran James, MD [facility].

## 2019-05-15 NOTE — Progress Notes (Signed)
Inpatient Rehabilitation Admissions Coordinator  Insurance has not yet made a determination on my appeal to admit to inpt rehab. I met with patient and he is aware. I have asked him to discuss with his daughter his options for d/c home vs SNF if insurance does not approve CIR. I will follow up on Monday.  Danne Baxter, RN, MSN Rehab Admissions Coordinator [phone] 05/15/2019 3:20 PM

## 2019-05-15 NOTE — Progress Notes (Signed)
Pt received from HD. VSS. Pt complaining of nausea and diarrhea, placed on bedpan. Call light in reach.   Clyde Canterbury, RN

## 2019-05-15 NOTE — Progress Notes (Signed)
  Progress Note    05/15/2019 10:52 AM 1 Day Post-Op  Subjective: No overnight issues  Vitals:   05/15/19 0954 05/15/19 1016  BP: (!) 138/56   Pulse: 63 69  Resp: 15   Temp: 98.1 F (36.7 C)   SpO2: 98%     Physical Exam: Awake alert oriented Right groin incision healing well Dressing to right BKA stump  CBC    Component Value Date/Time   WBC 6.6 05/15/2019 0217   RBC 2.40 (L) 05/15/2019 0217   HGB 7.7 (L) 05/15/2019 0217   HGB 11.4 (L) 11/12/2018 1637   HGB 10.8 (L) 03/14/2017 0921   HCT 24.5 (L) 05/15/2019 0217   HCT 35.0 (L) 11/12/2018 1637   HCT 32.5 (L) 03/14/2017 0921   PLT 110 (L) 05/15/2019 0217   PLT 51 (LL) 11/12/2018 1637   MCV 102.1 (H) 05/15/2019 0217   MCV 103 (H) 11/12/2018 1637   MCV 106.2 (H) 03/14/2017 0921   MCH 32.1 05/15/2019 0217   MCHC 31.4 05/15/2019 0217   RDW 13.9 05/15/2019 0217   RDW 14.6 11/12/2018 1637   RDW 14.6 03/14/2017 0921   LYMPHSABS 0.6 (L) 05/12/2019 0256   LYMPHSABS 1.0 11/12/2018 1637   LYMPHSABS 0.8 (L) 03/14/2017 0921   MONOABS 0.7 05/12/2019 0256   MONOABS 0.6 03/14/2017 0921   EOSABS 0.1 05/12/2019 0256   EOSABS 0.2 11/12/2018 1637   BASOSABS 0.1 05/12/2019 0256   BASOSABS 0.0 11/12/2018 1637   BASOSABS 0.0 03/14/2017 0921    BMET    Component Value Date/Time   NA 137 05/15/2019 0722   NA 135 07/14/2018 0909   NA 141 03/14/2017 0921   K 3.2 (L) 05/15/2019 0722   K 4.1 03/14/2017 0921   CL 96 (L) 05/15/2019 0722   CO2 22 05/15/2019 0722   CO2 33 (H) 03/14/2017 0921   GLUCOSE 122 (H) 05/15/2019 0722   GLUCOSE 144 (H) 03/14/2017 0921   BUN 32 (H) 05/15/2019 0722   BUN 76 (HH) 07/14/2018 0909   BUN 30.1 (H) 03/14/2017 0921   CREATININE 6.22 (H) 05/15/2019 0722   CREATININE 9.16 (HH) 09/17/2017 0954   CREATININE 8.1 (HH) 03/14/2017 0921   CALCIUM 8.0 (L) 05/15/2019 0722   CALCIUM 9.5 03/14/2017 0921   GFRNONAA 9 (L) 05/15/2019 0722   GFRNONAA 5 (L) 09/17/2017 0954   GFRNONAA 24 (L) 06/30/2014 1528     GFRAA 10 (L) 05/15/2019 0722   GFRAA 6 (L) 09/17/2017 0954   GFRAA 28 (L) 06/30/2014 1528    INR    Component Value Date/Time   INR 1.2 04/29/2019 0713   INR 1.10 (L) 01/21/2015 0809     Intake/Output Summary (Last 24 hours) at 05/15/2019 1052 Last data filed at 05/15/2019 0910 Gross per 24 hour  Intake 860 ml  Output 561 ml  Net 299 ml     Assessment:  69 y.o. male is status post right below-knee amputation.   Plan: Continue antibiotics Appeal to CIR.   [name] C. Donzetta Matters, MD [facility] Office: [phone] Pager: [phone]  05/15/2019 10:52 AM

## 2019-05-15 NOTE — Progress Notes (Signed)
[facility]  Date of Admission:  04/29/2019      Total days of antibiotics 5  Day 2 vancomycin            ASSESSMENT: Jose Garrett is a 69 y.o. male with ESRD via RUE AVF, amputation of RLE due to non-healing transtibial amputation after attempt at peripheral bypass in January 2021. S/P TAVR 10-2018. Here with enterococcus faecalis bacteremia identified nearly 2 weeks into hospitalization. TEE evaluated TAVR and no vegetations identified.   Given his valve and unidentified source of infection will treat with 4 weeks Vancomycin with HD. He can follow up a few weeks after he completes therapy for consideration of repeating blood cultures outpatient.   Diarrhea seems most likely a/w antibiotic use. Hopefully this will improve on vancomycin. Abdomen benign and no fevers/chills.   Will sign off but happy to answer any questions.    PLAN: 1. Continue Vancomycin for 4 weeks with dialysis through March 31st  2. Will arrange follow up in clinic 10-14 days after completion.   Will have him follow up in clinic with me April 15th @ 11:00 am to consider repeating blood cultures at that time following completion.     Principal Problem:   Enterococcal bacteremia Active Problems:   ESRD (end stage renal disease) on dialysis (HCC)   PAD (peripheral artery disease) (HCC)   S/P TAVR (transcatheter aortic valve replacement)   Pressure injury of skin   . amiodarone  200 mg Oral Daily  . atorvastatin  80 mg Oral Q supper  . calcitRIOL  1.5 mcg Oral Q M,W,F-HD  . carvedilol  3.125 mg Oral BID  . Chlorhexidine Gluconate Cloth  6 each Topical Q0600  . clopidogrel  75 mg Oral Q supper  . darbepoetin (ARANESP) injection - DIALYSIS  100 mcg Intravenous Q Fri-HD  . docusate sodium  100 mg Oral Daily  . heparin  5,000 Units Subcutaneous Q8H  . midodrine  10 mg Oral Q M,W,F-HD  . multivitamin  1 tablet Oral QHS  . pantoprazole  40 mg Oral Daily  . sodium  chloride flush  3 mL Intravenous Q12H  . sucroferric oxyhydroxide  1,500 mg Oral TID WC  . vitamin B-12  1,000 mcg Oral QPM    SUBJECTIVE: Just back from dialysis. Cut it short today as he was having some diarrhea and nausea that started. TEE negative for involvement of TAVR.    Review of Systems  Constitutional: Negative for chills, fever, malaise/fatigue and weight loss.  HENT:       No dental problems  Respiratory: Negative for cough and sputum production.   Cardiovascular: Negative for chest pain and leg swelling.  Gastrointestinal: Negative for abdominal pain, diarrhea and vomiting.  Genitourinary: Negative for dysuria and flank pain.  Musculoskeletal: Negative for joint pain (throbbing leg pain ), myalgias and neck pain.  Skin: Negative for rash.  Neurological: Negative for dizziness, tingling and headaches.    No Known Allergies  OBJECTIVE: Vitals:   05/15/19 0900 05/15/19 0910 05/15/19 0954 05/15/19 1016  BP: (!) 135/59 (!) 137/53 (!) 138/56   Pulse:  73 63 69  Resp: 15 16 15    Temp:  97.6 F (36.4 C) 98.1 F (36.7 C)   TempSrc:  Oral Oral   SpO2: (!) 65% 95% 98%   Weight:  68 kg    Height:       Body mass index is 21.51 kg/m.  Physical Exam Vitals reviewed.  Constitutional:      Comments: Resting quietly in bed.   HENT:     Mouth/Throat:     Mouth: No oral lesions.     Dentition: Normal dentition. No dental caries.  Eyes:     General: No scleral icterus. Cardiovascular:     Rate and Rhythm: Normal rate and regular rhythm.     Heart sounds: Murmur (systolic best heard at apex) present.  Pulmonary:     Effort: Pulmonary effort is normal.     Breath sounds: Normal breath sounds.  Abdominal:     General: There is no distension.     Palpations: Abdomen is soft.     Tenderness: There is no abdominal tenderness.  Lymphadenopathy:     Cervical: No cervical adenopathy.  Skin:    General: Skin is warm and dry.     Findings: No rash.  Neurological:      Mental Status: He is alert and oriented to person, place, and time.     Lab Results Lab Results  Component Value Date   WBC 6.6 05/15/2019   HGB 7.7 (L) 05/15/2019   HCT 24.5 (L) 05/15/2019   MCV 102.1 (H) 05/15/2019   PLT 110 (L) 05/15/2019    Lab Results  Component Value Date   CREATININE 6.22 (H) 05/15/2019   BUN 32 (H) 05/15/2019   NA 137 05/15/2019   K 3.2 (L) 05/15/2019   CL 96 (L) 05/15/2019   CO2 22 05/15/2019    Lab Results  Component Value Date   ALT 7 04/29/2019   AST 17 04/29/2019   ALKPHOS 63 04/29/2019   BILITOT 1.3 (H) 04/29/2019     Microbiology: Recent Results (from the past 240 hour(s))  Culture, blood (Routine X 2) w Reflex to ID Panel     Status: Abnormal   Collection Time: 05/11/19  2:20 PM   Specimen: BLOOD  Result Value Ref Range Status   Specimen Description BLOOD LEFT ANTECUBITAL  Final   Special Requests   Final    BOTTLES DRAWN AEROBIC AND ANAEROBIC Blood Culture adequate volume   Culture  Setup Time   Final    GRAM POSITIVE COCCI IN PAIRS IN CHAINS IN BOTH AEROBIC AND ANAEROBIC BOTTLES CRITICAL RESULT CALLED TO, READ BACK BY AND VERIFIED WITH: G. ABBOTT,PHARMD [id number] 05/12/2019 T. TYSOR    Culture (A)  Final    ENTEROCOCCUS FAECALIS SUSCEPTIBILITIES PERFORMED ON PREVIOUS CULTURE WITHIN THE LAST 5 DAYS. Performed at [facility], [place] [address] [place], [place] [address]    Report Status 05/14/2019 FINAL  Final  Blood Culture ID Panel (Reflexed)     Status: Abnormal   Collection Time: 05/11/19  2:20 PM  Result Value Ref Range Status   Enterococcus species DETECTED (A) NOT DETECTED Final    Comment: CRITICAL RESULT CALLED TO, READ BACK BY AND VERIFIED WITH: G. ABBOTT,PHARMD [id number] 05/12/2019 T. TYSOR    Vancomycin resistance NOT DETECTED NOT DETECTED Final   Listeria monocytogenes NOT DETECTED NOT DETECTED Final   Staphylococcus species NOT DETECTED NOT DETECTED Final   Staphylococcus aureus (BCID) NOT DETECTED NOT DETECTED  Final   Streptococcus species NOT DETECTED NOT DETECTED Final   Streptococcus agalactiae NOT DETECTED NOT DETECTED Final   Streptococcus pneumoniae NOT DETECTED NOT DETECTED Final   Streptococcus pyogenes NOT DETECTED NOT DETECTED Final   Acinetobacter baumannii NOT DETECTED NOT DETECTED Final   Enterobacteriaceae species NOT DETECTED NOT DETECTED Final   Enterobacter cloacae  complex NOT DETECTED NOT DETECTED Final   Escherichia coli NOT DETECTED NOT DETECTED Final   Klebsiella oxytoca NOT DETECTED NOT DETECTED Final   Klebsiella pneumoniae NOT DETECTED NOT DETECTED Final   Proteus species NOT DETECTED NOT DETECTED Final   Serratia marcescens NOT DETECTED NOT DETECTED Final   Haemophilus influenzae NOT DETECTED NOT DETECTED Final   Neisseria meningitidis NOT DETECTED NOT DETECTED Final   Pseudomonas aeruginosa NOT DETECTED NOT DETECTED Final   Candida albicans NOT DETECTED NOT DETECTED Final   Candida glabrata NOT DETECTED NOT DETECTED Final   Candida krusei NOT DETECTED NOT DETECTED Final   Candida parapsilosis NOT DETECTED NOT DETECTED Final   Candida tropicalis NOT DETECTED NOT DETECTED Final    Comment: Performed at [facility], [place] [address], [place], [place] [address]  Culture, blood (Routine X 2) w Reflex to ID Panel     Status: Abnormal   Collection Time: 05/11/19  2:28 PM   Specimen: BLOOD  Result Value Ref Range Status   Specimen Description BLOOD LEFT ANTECUBITAL  Final   Special Requests   Final    BOTTLES DRAWN AEROBIC AND ANAEROBIC Blood Culture results may not be optimal due to an inadequate volume of blood received in culture bottles   Culture  Setup Time   Final    GRAM POSITIVE COCCI IN PAIRS IN CHAINS IN BOTH AEROBIC AND ANAEROBIC BOTTLES CRITICAL RESULT CALLED TO, READ BACK BY AND VERIFIED WITH: Salli Real [id number] 05/12/2019 Mena Goes Performed at [facility], [place] [address] [place], [place] [address]    Culture ENTEROCOCCUS FAECALIS (A)  Final    Report Status 05/14/2019 FINAL  Final   Organism ID, Bacteria ENTEROCOCCUS FAECALIS  Final      Susceptibility   Enterococcus faecalis - MIC*    AMPICILLIN <=2 SENSITIVE Sensitive     VANCOMYCIN 1 SENSITIVE Sensitive     GENTAMICIN SYNERGY RESISTANT Resistant     * ENTEROCOCCUS FAECALIS    Janene Madeira, MSN, NP-C [email or web address] Pager: [phone] Office: [place]: [phone]

## 2019-05-16 LAB — RENAL FUNCTION PANEL
Albumin: 2.1 g/dL — ABNORMAL LOW (ref 3.5–5.0)
Anion gap: 16 — ABNORMAL HIGH (ref 5–15)
BUN: 29 mg/dL — ABNORMAL HIGH (ref 8–23)
CO2: 22 mmol/L (ref 22–32)
Calcium: 8.2 mg/dL — ABNORMAL LOW (ref 8.9–10.3)
Chloride: 100 mmol/L (ref 98–111)
Creatinine, Ser: 6.2 mg/dL — ABNORMAL HIGH (ref 0.61–1.24)
GFR calc Af Amer: 10 mL/min — ABNORMAL LOW (ref >60)
GFR calc non Af Amer: 9 mL/min — ABNORMAL LOW (ref >60)
Glucose, Bld: 79 mg/dL (ref 70–99)
Phosphorus: 4.8 mg/dL — ABNORMAL HIGH (ref 2.5–4.6)
Potassium: 3.8 mmol/L (ref 3.5–5.1)
Sodium: 138 mmol/L (ref 135–145)

## 2019-05-16 NOTE — Progress Notes (Addendum)
Progress Note    05/16/2019 9:05 AM 2 Days Post-Op  Subjective:  No complaints this morning   Vitals:   05/15/19 2013 05/16/19 0515  BP: (!) 148/53 (!) 136/52  Pulse: 67 60  Resp: 14 17  Temp: 98.3 F (36.8 C) 98.3 F (36.8 C)  SpO2: 98% 96%   Physical Exam: Lungs:  Non labored Incisions:  R BKA and RLE incisions c/d/i Abdomen:  soft Neurologic: A&O  CBC    Component Value Date/Time   WBC 6.6 05/15/2019 0217   RBC 2.40 (L) 05/15/2019 0217   HGB 7.7 (L) 05/15/2019 0217   HGB 11.4 (L) 11/12/2018 1637   HGB 10.8 (L) 03/14/2017 0921   HCT 24.5 (L) 05/15/2019 0217   HCT 35.0 (L) 11/12/2018 1637   HCT 32.5 (L) 03/14/2017 0921   PLT 110 (L) 05/15/2019 0217   PLT 51 (LL) 11/12/2018 1637   MCV 102.1 (H) 05/15/2019 0217   MCV 103 (H) 11/12/2018 1637   MCV 106.2 (H) 03/14/2017 0921   MCH 32.1 05/15/2019 0217   MCHC 31.4 05/15/2019 0217   RDW 13.9 05/15/2019 0217   RDW 14.6 11/12/2018 1637   RDW 14.6 03/14/2017 0921   LYMPHSABS 0.6 (L) 05/12/2019 0256   LYMPHSABS 1.0 11/12/2018 1637   LYMPHSABS 0.8 (L) 03/14/2017 0921   MONOABS 0.7 05/12/2019 0256   MONOABS 0.6 03/14/2017 0921   EOSABS 0.1 05/12/2019 0256   EOSABS 0.2 11/12/2018 1637   BASOSABS 0.1 05/12/2019 0256   BASOSABS 0.0 11/12/2018 1637   BASOSABS 0.0 03/14/2017 0921    BMET    Component Value Date/Time   NA 137 05/15/2019 0722   NA 135 07/14/2018 0909   NA 141 03/14/2017 0921   K 3.2 (L) 05/15/2019 0722   K 4.1 03/14/2017 0921   CL 96 (L) 05/15/2019 0722   CO2 22 05/15/2019 0722   CO2 33 (H) 03/14/2017 0921   GLUCOSE 122 (H) 05/15/2019 0722   GLUCOSE 144 (H) 03/14/2017 0921   BUN 32 (H) 05/15/2019 0722   BUN 76 (HH) 07/14/2018 0909   BUN 30.1 (H) 03/14/2017 0921   CREATININE 6.22 (H) 05/15/2019 0722   CREATININE 9.16 (HH) 09/17/2017 0954   CREATININE 8.1 (HH) 03/14/2017 0921   CALCIUM 8.0 (L) 05/15/2019 0722   CALCIUM 9.5 03/14/2017 0921   GFRNONAA 9 (L) 05/15/2019 0722   GFRNONAA 5 (L)  09/17/2017 0954   GFRNONAA 24 (L) 06/30/2014 1528   GFRAA 10 (L) 05/15/2019 0722   GFRAA 6 (L) 09/17/2017 0954   GFRAA 28 (L) 06/30/2014 1528    INR    Component Value Date/Time   INR 1.2 04/29/2019 0713   INR 1.10 (L) 01/21/2015 0809     Intake/Output Summary (Last 24 hours) at 05/16/2019 0905 Last data filed at 05/15/2019 2000 Gross per 24 hour  Intake 1070.21 ml  Output 561 ml  Net 509.21 ml     Assessment/Plan:  69 y.o. male is s/p R BKA   R BKA incision healing well Encouraged patient to keep R knee extension TEE negative; vancomycin post HD per ID Appealing denial for CIR; TOC discussing home vs SNF with family if appeal is also denied   Dagoberto Ligas, PA-C [facility] [phone] 05/16/2019 9:05 AM   I have seen and evaluated the patient. I agree with the PA note as documented above. R BKA looks good.  No vegetation on TEE given bacteremia.  Continue antiobiotics for 4 weeks per ID.  Afebrile.  Harrell Gave  Addison Naegeli, MD [facility] Office: [phone]

## 2019-05-16 NOTE — Progress Notes (Addendum)
[facility] Progress Note   Subjective:   Patient seen in room. Shortened HD yesterday due to GI upset. Feeling well today, denies SOB, cough, CP, palpitations, dizziness, abdominal pain, N/V/D. Does not want extra HD today.   Objective Vitals:   05/15/19 1140 05/15/19 2013 05/16/19 0515 05/16/19 0926  BP: (!) 124/53 (!) 148/53 (!) 136/52 (!) 150/57  Pulse: 62 67 60 66  Resp: 14 14 17    Temp: 98.1 F (36.7 C) 98.3 F (36.8 C) 98.3 F (36.8 C) 98.1 F (36.7 C)  TempSrc: Oral Oral Oral Oral  SpO2: 97% 98% 96% 100%  Weight:      Height:       Physical Exam General: Well developed, well nourished male. Alert and in NAD Heart: RRR, no murmurs, rubs or gallops Lungs: CTA bilaterally without wheezing, rhonchi or rales Abdomen: Soft, non-tender, non-distended. +BS Extremities: RLE BKA, LLE without edema Dialysis Access: RUE AVF + thrill/bruit  Additional Objective Labs: Basic Metabolic Panel: Recent Labs  Lab 05/13/19 0734 05/14/19 1208 05/15/19 0722  NA 135 137 137  K 3.2* 3.4* 3.2*  CL 94* 97* 96*  CO2 22 24 22   GLUCOSE 92 75 122*  BUN 48* 23 32*  CREATININE 7.00* 4.93* 6.22*  CALCIUM 8.0* 8.1* 8.0*  PHOS 6.6* 4.4 5.1*   Liver Function Tests: Recent Labs  Lab 05/13/19 0734 05/14/19 1208 05/15/19 0722  ALBUMIN 2.0* 2.2* 2.2*   CBC: Recent Labs  Lab 05/11/19 0818 05/11/19 0818 05/12/19 0256 05/13/19 0955 05/15/19 0217  WBC 10.8*   < > 11.3* 7.9 6.6  NEUTROABS  --   --  9.3*  --   --   HGB 8.1*   < > 7.6* 7.1* 7.7*  HCT 25.6*   < > 25.2* 23.1* 24.5*  MCV 103.6*  --  105.9* 105.0* 102.1*  PLT 153   < > 130* 122* 110*   < > = values in this interval not displayed.   Blood Culture    Component Value Date/Time   SDES BLOOD LEFT ANTECUBITAL 05/14/2019 0522   SPECREQUEST AEROBIC BOTTLE ONLY Blood Culture adequate volume 05/14/2019 0522   CULT  05/14/2019 0522    NO GROWTH 1 DAY Performed at [facility], [place] [address]  [place], [place] [address]    REPTSTATUS PENDING 05/14/2019 0522    Iron Studies:  Recent Labs    05/14/19 1208  IRON 41*  TIBC 118*   @lablastinr3 @ Studies/Results: ECHO TEE  Result Date: 05/14/2019    TRANSESOPHOGEAL ECHO REPORT   Patient Name:   Jose Garrett Date of Exam: 05/14/2019 Medical Rec #:  [id number]      Height:       70.0 in Accession #:    [id number]     Weight:       147.9 lb Date of Birth:  06-May-1950      BSA:          1.836 m Patient Age:    69 years       BP:           110/39 mmHg Patient Gender: M              HR:           61 bpm. Exam Location:  Inpatient Procedure: Transesophageal Echo, Cardiac Doppler and Color Doppler Indications:     Bacteremia  History:         Patient has prior history of Echocardiogram examinations, most  recent 05/13/2019. CHF, CAD, PAD, Aortic Valve Disease,                  Signs/Symptoms:Bacteremia; Risk Factors:Hypertension and                  Dyslipidemia. ESRD.                  Aortic Valve: 26 mm Edwards Edwards Sapien prosthetic, stented                  (TAVR) valve is present in the aortic position. Procedure Date:                  10/21/2018.  Sonographer:     Dustin Flock Referring Phys:  [id number] [place] Diagnosing Phys: Dorris Carnes MD PROCEDURE: The transesophogeal probe was passed without difficulty through the esophogus of the patient. Sedation performed by performing physician. The patient was monitored while under deep sedation. Anesthestetic sedation was provided intravenously by  Anesthesiology: 90mg  of Propofol. The patient developed no complications during the procedure. IMPRESSIONS  1. No obvious vegetations.  2. Left ventricular ejection fraction, by estimation, is 55 to 60%. The left ventricle has normal function.  3. Right ventricular systolic function is normal.  4. No left atrial/left atrial appendage thrombus was detected.  5. The aortic valve is grossly normal. Aortic valve regurgitation is not visualized.  There is a 26 mm Edwards Edwards Sapien prosthetic (TAVR) valve present in the aortic position. Procedure Date: 10/21/2018. Echo findings are consistent with normal structure and function of the aortic valve prosthesis. FINDINGS  Left Ventricle: Left ventricular ejection fraction, by estimation, is 55 to 60%. The left ventricle has normal function. The left ventricle has no regional wall motion abnormalities. The left ventricular internal cavity size was normal in size. There is  no left ventricular hypertrophy. Right Ventricle: The right ventricular size is normal. Right vetricular wall thickness was not assessed. Right ventricular systolic function is normal. Left Atrium: Left atrial size was normal in size. No left atrial/left atrial appendage thrombus was detected. Right Atrium: Right atrial size was normal in size. Pericardium: There is no evidence of pericardial effusion. Mitral Valve: The mitral valve is abnormal. There is mild thickening of the mitral valve leaflet(s). Trivial mitral valve regurgitation. Tricuspid Valve: The tricuspid valve is normal in structure. Tricuspid valve regurgitation is trivial. Aortic Valve: The aortic valve is grossly normal. Aortic valve regurgitation is not visualized. There is a 26 mm Edwards Edwards Sapien prosthetic, stented (TAVR) valve present in the aortic position. Procedure Date: 10/21/2018. Echo findings are consistent with normal structure and function of the aortic valve prosthesis. Pulmonic Valve: The pulmonic valve was grossly normal. Pulmonic valve regurgitation is not visualized. Aorta: The aortic root is normal in size and structure. There is mild (Grade II) plaque. IAS/Shunts: No atrial level shunt detected by color flow Doppler. Dorris Carnes MD Electronically signed by Dorris Carnes MD Signature Date/Time: 05/14/2019/11:33:16 PM    Final    Medications: . sodium chloride    . sodium chloride Stopped (05/14/19 2117)  . magnesium sulfate bolus IVPB    . [START ON  05/18/2019] vancomycin     . amiodarone  200 mg Oral Daily  . atorvastatin  80 mg Oral Q supper  . calcitRIOL  1.5 mcg Oral Q M,W,F-HD  . carvedilol  3.125 mg Oral BID  . Chlorhexidine Gluconate Cloth  6 each Topical Q0600  . clopidogrel  75 mg Oral  Q supper  . darbepoetin (ARANESP) injection - DIALYSIS  100 mcg Intravenous Q Fri-HD  . docusate sodium  100 mg Oral Daily  . heparin  5,000 Units Subcutaneous Q8H  . midodrine  10 mg Oral Q M,W,F-HD  . multivitamin  1 tablet Oral QHS  . pantoprazole  40 mg Oral Daily  . sodium chloride flush  3 mL Intravenous Q12H  . sucroferric oxyhydroxide  1,500 mg Oral TID WC  . vitamin B-12  1,000 mcg Oral QPM    Dialysis Orders:   Assessment/Plan: 1. PAD w/ gangrenous changes - s/p R TMA on 2/1 healing poorly so s/p R BKA on 2/25.  VVS following.  2. Sepsis: +BC w/enterococcus, on vanomycin & improving. Plan for vanc for 4 weeks after last negative culture.  Etiology unclear, incision sites clean per VVS, CXR w/o active dz, no urine available to culture.  ID following.  TEE shows normal appearing AV prothesis with no valvuar or perivalvular AI.  No vegetation. Trivial MR & trace TR.   3. ESRD - on MWF.  Last HD short due to GI upset, does not want extra HD today. Last  K 3.2, used higher K+ bath but did not get full treatment. Recheck today and daily, may need supplementation.  4. Anemia of CKD- Hgb^7.7 yesterday, AM labs pending.  Aranesp q Friday.  tsat 35%, no Fe needed.  Transfuse prn.  5. Secondary hyperparathyroidism - Ca 8.0, phos 5.1 w/increased Velphoro dose(3/2), continue binders and VDRA.  6. HTN/volume - Blood pressure variable.  Does not appear volume overloaded.  Titrate down volume as tolerated.  Will need new dry at d/c.  7. Nutrition - Alb 2.2, Renal diet w/fluid restrictions, protein supplements, Vit 8. Hx TAVR 9. CAD/HFrEF 9. Dispo: admitted to [facility], PA-C 05/16/2019, 11:17 AM  [facility] Pager:  [phone]  Nephrology attending: Patient was seen and examined at bedside.  Chart reviewed.  I agree with assessment and plan as outlined above. ESRD on HD.  Patient has Enterococcus bacteremia.  Seen by ID who recommended IV vancomycin post HD for 4 weeks after the last negative culture.  TEE with no vegetation.  Had dialysis yesterday.  Plan for next HD on 3/8.  Discharge planning ongoing.  Katheran James, MD [facility].

## 2019-05-16 NOTE — Plan of Care (Signed)
Continue to monitor

## 2019-05-17 LAB — RENAL FUNCTION PANEL
Albumin: 2.1 g/dL — ABNORMAL LOW (ref 3.5–5.0)
Anion gap: 17 — ABNORMAL HIGH (ref 5–15)
BUN: 38 mg/dL — ABNORMAL HIGH (ref 8–23)
CO2: 23 mmol/L (ref 22–32)
Calcium: 8.2 mg/dL — ABNORMAL LOW (ref 8.9–10.3)
Chloride: 97 mmol/L — ABNORMAL LOW (ref 98–111)
Creatinine, Ser: 7.23 mg/dL — ABNORMAL HIGH (ref 0.61–1.24)
GFR calc Af Amer: 8 mL/min — ABNORMAL LOW (ref >60)
GFR calc non Af Amer: 7 mL/min — ABNORMAL LOW (ref >60)
Glucose, Bld: 97 mg/dL (ref 70–99)
Phosphorus: 5.4 mg/dL — ABNORMAL HIGH (ref 2.5–4.6)
Potassium: 3.4 mmol/L — ABNORMAL LOW (ref 3.5–5.1)
Sodium: 137 mmol/L (ref 135–145)

## 2019-05-17 MED ORDER — CHLORHEXIDINE GLUCONATE CLOTH 2 % EX PADS
6.0000 | MEDICATED_PAD | Freq: Every day | CUTANEOUS | Status: DC
  Administered 2019-05-18: 06:00:00 6 via TOPICAL

## 2019-05-17 NOTE — Plan of Care (Signed)
Continue to monitor

## 2019-05-17 NOTE — Progress Notes (Addendum)
Pharmacy Antibiotic Note  Jose Garrett is a 69 y.o. male admitted on 04/29/2019 with Enterococcal bacteremia.  Pharmacy has been consulted for vancomycin dosing. Patient's enterococcus is Amp-S but switching to Vanc for ease of dosing as an outpatient. He is noted to have healing at site of amputation. WBC are wnl at 6.6 (last on 3/5). He is afebrile. ID is following and recommends 4 weeks of treatment through March 31st.  Last hemodialysis session was on 3/5 and was 3 hours at BFR of 400 and he did receive 750 mg of vancomycin after this session. He is not charted to be making urine. He is on a MWF dialysis schedule and therefore another dose of vancomycin will not be due until his next HD session on 3/8.   Microbiology results:  3/1 BCx: e. Faecalis R to gent, sensitive to ampicillin and vancomycin  3/4 BCx: ngtd   Plan: Vancomycin 750 mg post HD MWF Plan 4 weeks of therapy through 3/31 Will follow HD schedule if supplemental sessions are needed Will check a random vancomycin level on 3/8 since patient is charted to be anuric and did not tolerate a full HD session on 3/5   Height: (pt refused bath. ) Weight: 149 lb 14.6 oz (68 kg) IBW/kg (Calculated) : 73  Temp (24hrs), Avg:98.4 F (36.9 C), Min:98.1 F (36.7 C), Max:98.5 F (36.9 C)  Recent Labs  Lab 05/11/19 0818 05/11/19 0818 05/12/19 0256 05/13/19 0734 05/13/19 0955 05/14/19 1208 05/15/19 0217 05/15/19 0722 05/16/19 1142 05/17/19 0218  WBC 10.8*  --  11.3*  --  7.9  --  6.6  --   --   --   CREATININE 8.45*   < >  --  7.00*  --  4.93*  --  6.22* 6.20* 7.23*   < > = values in this interval not displayed.    Estimated Creatinine Clearance: 9.5 mL/min (A) (by C-G formula based on SCr of 7.23 mg/dL (H)).    No Known Allergies   Thank you,   Eddie Candle, PharmD PGY-1 Pharmacy Resident   Please check amion for clinical pharmacist contact number

## 2019-05-17 NOTE — Progress Notes (Addendum)
[facility] Progress Note   Subjective:   Patient seen in room. Reports feeling well today. Still with some fatigue and intermittent nausea. Denies SOB, orthopnea, cough, CP, palpitations, and dizziness.  Objective Vitals:   05/16/19 2016 05/16/19 2017 05/17/19 0447 05/17/19 0933  BP:  (!) 146/55 (!) 154/60 (!) 155/56  Pulse: 66 66 63   Resp: 19 19 16 16   Temp: 98.5 F (36.9 C) 98.5 F (36.9 C) 98.3 F (36.8 C) 97.8 F (36.6 C)  TempSrc: Oral Oral Oral Oral  SpO2:  98% 100% 99%  Weight:      Height:       Physical Exam General: Well developed, well nourished male. Alert and in NAD Heart: RRR, no murmurs, rubs or gallops Lungs: CTA bilaterally without wheezing, rhonchi or rales Abdomen: Soft, non-tender, non-distended. +BS Extremities: RLE BKA, LLE without edema Dialysis Access: RUE AVF + thrill/bruit  Additional Objective Labs: Basic Metabolic Panel: Recent Labs  Lab 05/15/19 0722 05/16/19 1142 05/17/19 0218  NA 137 138 137  K 3.2* 3.8 3.4*  CL 96* 100 97*  CO2 22 22 23   GLUCOSE 122* 79 97  BUN 32* 29* 38*  CREATININE 6.22* 6.20* 7.23*  CALCIUM 8.0* 8.2* 8.2*  PHOS 5.1* 4.8* 5.4*   Liver Function Tests: Recent Labs  Lab 05/15/19 0722 05/16/19 1142 05/17/19 0218  ALBUMIN 2.2* 2.1* 2.1*   No results for input(s): LIPASE, AMYLASE in the last 168 hours. CBC: Recent Labs  Lab 05/11/19 0818 05/11/19 0818 05/12/19 0256 05/13/19 0955 05/15/19 0217  WBC 10.8*   < > 11.3* 7.9 6.6  NEUTROABS  --   --  9.3*  --   --   HGB 8.1*   < > 7.6* 7.1* 7.7*  HCT 25.6*   < > 25.2* 23.1* 24.5*  MCV 103.6*  --  105.9* 105.0* 102.1*  PLT 153   < > 130* 122* 110*   < > = values in this interval not displayed.   Blood Culture    Component Value Date/Time   SDES BLOOD LEFT ANTECUBITAL 05/14/2019 0522   SPECREQUEST AEROBIC BOTTLE ONLY Blood Culture adequate volume 05/14/2019 0522   CULT  05/14/2019 0522    NO GROWTH 3 DAYS Performed at [facility], [place] [address] [place], [place] [address]    REPTSTATUS PENDING 05/14/2019 0522    Iron Studies:  Recent Labs    05/14/19 1208  IRON 41*  TIBC 118*   Medications: . sodium chloride    . sodium chloride Stopped (05/14/19 2117)  . magnesium sulfate bolus IVPB    . [START ON 05/18/2019] vancomycin     . amiodarone  200 mg Oral Daily  . atorvastatin  80 mg Oral Q supper  . calcitRIOL  1.5 mcg Oral Q M,W,F-HD  . carvedilol  3.125 mg Oral BID  . Chlorhexidine Gluconate Cloth  6 each Topical Q0600  . clopidogrel  75 mg Oral Q supper  . darbepoetin (ARANESP) injection - DIALYSIS  100 mcg Intravenous Q Fri-HD  . docusate sodium  100 mg Oral Daily  . heparin  5,000 Units Subcutaneous Q8H  . midodrine  10 mg Oral Q M,W,F-HD  . multivitamin  1 tablet Oral QHS  . pantoprazole  40 mg Oral Daily  . sodium chloride flush  3 mL Intravenous Q12H  . sucroferric oxyhydroxide  1,500 mg Oral TID WC  . vitamin B-12  1,000 mcg Oral QPM    Dialysis Orders: MWF at [facility]  4hr, 400/800, EDW 76.5kg, 2K/2Ca, UFP #4, AVF, -Heparin 2400units IV TIW - Parsabiv 5mg  IV q HD - Calcitriol 1.52mcg PO q HD - Mircera 6mcg IV q 2 weeks (last given 90mcg on 1/20, Hgb 10.3 on 2/10)  Assessment/Plan: 1.PAD w/ gangrenous changes -s/p R TMA on 2/1 healing poorly so s/p R BKA on 2/25. VVS following.  2. Sepsis: +BC w/enterococcus, onvanomycin&improving. Plan for vanc for 4 weeks after last negative culture.  Etiology unclear, incision sites clean per VVS, CXR w/o active dz, no urine available to culture. ID following. TEE shows normal appearing AV prothesis with no valvuar or perivalvular AI. No vegetation. Trivial MR &trace TR.  3. ESRD- on MWF. Last HD short due to GI upset. K+ 3.4- use 4K bath with HD tomorrow.  4. Anemiaof CKD-Hgb 7.7. Aranesp q Friday. tsat 35%, no Fe needed. Transfuse prn.  5. Secondary hyperparathyroidism- Ca 8.2, phos 5.4 w/increased Velphoro dose(3/2), continue  binders and VDRA.  6.HTN/volume- Blood pressurevariable. Does not appear volume overloaded. Titrate down volume as tolerated. Will need new dry at d/c.  7. Nutrition- Alb 2.2, Renal diet w/fluid restrictions, protein supplements, Vit 8. Hx TAVR 9. CAD/HFrEF 9. Dispo: pending CIR  Anice Paganini, PA-C 05/17/2019, 10:39 AM  [facility] Pager: [phone]  Nephrology attending: Patient was seen and examined at bedside.  Chart reviewed.  I agree with assessment and plan as outlined above. ESRD on HD MWF, plan for next HD tomorrow with 4K bath.  Enterococcus bacteremia, on IV vancomycin for 4 weeks after last negative culture.  TEE negative.  Patient looks clinically stable without any complaint today.  Now waiting for insurance approval to go to inpatient rehab.  Katheran James, MD [facility].

## 2019-05-17 NOTE — Progress Notes (Addendum)
Progress Note    05/17/2019 9:02 AM 3 Days Post-Op  Subjective:  No complaints   Vitals:   05/16/19 2017 05/17/19 0447  BP: (!) 146/55 (!) 154/60  Pulse: 66 63  Resp: 19 16  Temp: 98.5 F (36.9 C) 98.3 F (36.8 C)  SpO2: 98% 100%    Physical Exam: Incisions:  R BKA dressing left in place   CBC    Component Value Date/Time   WBC 6.6 05/15/2019 0217   RBC 2.40 (L) 05/15/2019 0217   HGB 7.7 (L) 05/15/2019 0217   HGB 11.4 (L) 11/12/2018 1637   HGB 10.8 (L) 03/14/2017 0921   HCT 24.5 (L) 05/15/2019 0217   HCT 35.0 (L) 11/12/2018 1637   HCT 32.5 (L) 03/14/2017 0921   PLT 110 (L) 05/15/2019 0217   PLT 51 (LL) 11/12/2018 1637   MCV 102.1 (H) 05/15/2019 0217   MCV 103 (H) 11/12/2018 1637   MCV 106.2 (H) 03/14/2017 0921   MCH 32.1 05/15/2019 0217   MCHC 31.4 05/15/2019 0217   RDW 13.9 05/15/2019 0217   RDW 14.6 11/12/2018 1637   RDW 14.6 03/14/2017 0921   LYMPHSABS 0.6 (L) 05/12/2019 0256   LYMPHSABS 1.0 11/12/2018 1637   LYMPHSABS 0.8 (L) 03/14/2017 0921   MONOABS 0.7 05/12/2019 0256   MONOABS 0.6 03/14/2017 0921   EOSABS 0.1 05/12/2019 0256   EOSABS 0.2 11/12/2018 1637   BASOSABS 0.1 05/12/2019 0256   BASOSABS 0.0 11/12/2018 1637   BASOSABS 0.0 03/14/2017 0921    BMET    Component Value Date/Time   NA 137 05/17/2019 0218   NA 135 07/14/2018 0909   NA 141 03/14/2017 0921   K 3.4 (L) 05/17/2019 0218   K 4.1 03/14/2017 0921   CL 97 (L) 05/17/2019 0218   CO2 23 05/17/2019 0218   CO2 33 (H) 03/14/2017 0921   GLUCOSE 97 05/17/2019 0218   GLUCOSE 144 (H) 03/14/2017 0921   BUN 38 (H) 05/17/2019 0218   BUN 76 (HH) 07/14/2018 0909   BUN 30.1 (H) 03/14/2017 0921   CREATININE 7.23 (H) 05/17/2019 0218   CREATININE 9.16 (HH) 09/17/2017 0954   CREATININE 8.1 (HH) 03/14/2017 0921   CALCIUM 8.2 (L) 05/17/2019 0218   CALCIUM 9.5 03/14/2017 0921   GFRNONAA 7 (L) 05/17/2019 0218   GFRNONAA 5 (L) 09/17/2017 0954   GFRNONAA 24 (L) 06/30/2014 1528   GFRAA 8 (L)  05/17/2019 0218   GFRAA 6 (L) 09/17/2017 0954   GFRAA 28 (L) 06/30/2014 1528    INR    Component Value Date/Time   INR 1.2 04/29/2019 0713   INR 1.10 (L) 01/21/2015 0809     Intake/Output Summary (Last 24 hours) at 05/17/2019 0902 Last data filed at 05/16/2019 2158 Gross per 24 hour  Intake 250 ml  Output --  Net 250 ml     Assessment/Plan:  69 y.o. male is s/p right below knee amputation  - Continue R BKA dry dressing changes - Encouraged knee extension - 4 weeks with vanc with HD per ID - home vs SNF if CIR appeal denied   Dagoberto Ligas, PA-C [facility] [phone] 05/17/2019 9:02 AM   I have seen and evaluated the patient. I agree with the PA note as documented above.  R BKA continues to looks good.  No vegetation on TEE given bacteremia.  Continue antiobiotics for 4 weeks per ID.  Remains afebrile.  Next HD 3/8.  Awaiting insurance appeal to CIR.  Marty Heck, MD  [facility] Office: [phone]

## 2019-05-18 ENCOUNTER — Inpatient Hospital Stay (HOSPITAL_COMMUNITY)
Admission: RE | Admit: 2019-05-18 | Discharge: 2019-05-28 | DRG: 559 | Disposition: A | Discharge status: Home Health | Payer: Medicare Other | Source: Intra-hospital | Attending: Physical Medicine & Rehabilitation | Admitting: Physical Medicine & Rehabilitation

## 2019-05-18 ENCOUNTER — Encounter (HOSPITAL_COMMUNITY): Payer: Self-pay | Admitting: Physical Medicine & Rehabilitation

## 2019-05-18 ENCOUNTER — Other Ambulatory Visit: Payer: Self-pay

## 2019-05-18 DIAGNOSIS — F419 Anxiety disorder, unspecified: Secondary | ICD-10-CM | POA: Diagnosis present

## 2019-05-18 DIAGNOSIS — T40605A Adverse effect of unspecified narcotics, initial encounter: Secondary | ICD-10-CM | POA: Diagnosis not present

## 2019-05-18 DIAGNOSIS — Z87891 Personal history of nicotine dependence: Secondary | ICD-10-CM | POA: Diagnosis not present

## 2019-05-18 DIAGNOSIS — Z833 Family history of diabetes mellitus: Secondary | ICD-10-CM | POA: Diagnosis not present

## 2019-05-18 DIAGNOSIS — Z4781 Encounter for orthopedic aftercare following surgical amputation: Principal | ICD-10-CM

## 2019-05-18 DIAGNOSIS — K219 Gastro-esophageal reflux disease without esophagitis: Secondary | ICD-10-CM | POA: Diagnosis present

## 2019-05-18 DIAGNOSIS — D696 Thrombocytopenia, unspecified: Secondary | ICD-10-CM | POA: Diagnosis not present

## 2019-05-18 DIAGNOSIS — E859 Amyloidosis, unspecified: Secondary | ICD-10-CM | POA: Diagnosis not present

## 2019-05-18 DIAGNOSIS — Z89431 Acquired absence of right foot: Secondary | ICD-10-CM | POA: Diagnosis not present

## 2019-05-18 DIAGNOSIS — A4181 Sepsis due to Enterococcus: Secondary | ICD-10-CM | POA: Diagnosis not present

## 2019-05-18 DIAGNOSIS — I2510 Atherosclerotic heart disease of native coronary artery without angina pectoris: Secondary | ICD-10-CM | POA: Diagnosis not present

## 2019-05-18 DIAGNOSIS — Z7902 Long term (current) use of antithrombotics/antiplatelets: Secondary | ICD-10-CM

## 2019-05-18 DIAGNOSIS — M47816 Spondylosis without myelopathy or radiculopathy, lumbar region: Secondary | ICD-10-CM | POA: Diagnosis not present

## 2019-05-18 DIAGNOSIS — G4700 Insomnia, unspecified: Secondary | ICD-10-CM | POA: Diagnosis not present

## 2019-05-18 DIAGNOSIS — G546 Phantom limb syndrome with pain: Secondary | ICD-10-CM | POA: Diagnosis not present

## 2019-05-18 DIAGNOSIS — D649 Anemia, unspecified: Secondary | ICD-10-CM | POA: Diagnosis not present

## 2019-05-18 DIAGNOSIS — G479 Sleep disorder, unspecified: Secondary | ICD-10-CM

## 2019-05-18 DIAGNOSIS — Z808 Family history of malignant neoplasm of other organs or systems: Secondary | ICD-10-CM

## 2019-05-18 DIAGNOSIS — I951 Orthostatic hypotension: Secondary | ICD-10-CM | POA: Diagnosis not present

## 2019-05-18 DIAGNOSIS — N186 End stage renal disease: Secondary | ICD-10-CM | POA: Diagnosis not present

## 2019-05-18 DIAGNOSIS — K5900 Constipation, unspecified: Secondary | ICD-10-CM | POA: Diagnosis not present

## 2019-05-18 DIAGNOSIS — Z8249 Family history of ischemic heart disease and other diseases of the circulatory system: Secondary | ICD-10-CM

## 2019-05-18 DIAGNOSIS — Z992 Dependence on renal dialysis: Secondary | ICD-10-CM | POA: Diagnosis not present

## 2019-05-18 DIAGNOSIS — I5042 Chronic combined systolic (congestive) and diastolic (congestive) heart failure: Secondary | ICD-10-CM | POA: Diagnosis not present

## 2019-05-18 DIAGNOSIS — I132 Hypertensive heart and chronic kidney disease with heart failure and with stage 5 chronic kidney disease, or end stage renal disease: Secondary | ICD-10-CM | POA: Diagnosis not present

## 2019-05-18 DIAGNOSIS — I252 Old myocardial infarction: Secondary | ICD-10-CM | POA: Diagnosis not present

## 2019-05-18 DIAGNOSIS — Z953 Presence of xenogenic heart valve: Secondary | ICD-10-CM

## 2019-05-18 DIAGNOSIS — N2581 Secondary hyperparathyroidism of renal origin: Secondary | ICD-10-CM | POA: Diagnosis not present

## 2019-05-18 DIAGNOSIS — R7881 Bacteremia: Secondary | ICD-10-CM

## 2019-05-18 DIAGNOSIS — D631 Anemia in chronic kidney disease: Secondary | ICD-10-CM | POA: Diagnosis not present

## 2019-05-18 DIAGNOSIS — I739 Peripheral vascular disease, unspecified: Secondary | ICD-10-CM | POA: Diagnosis not present

## 2019-05-18 DIAGNOSIS — I70261 Atherosclerosis of native arteries of extremities with gangrene, right leg: Secondary | ICD-10-CM | POA: Diagnosis not present

## 2019-05-18 DIAGNOSIS — G2581 Restless legs syndrome: Secondary | ICD-10-CM | POA: Diagnosis present

## 2019-05-18 DIAGNOSIS — E785 Hyperlipidemia, unspecified: Secondary | ICD-10-CM | POA: Diagnosis present

## 2019-05-18 DIAGNOSIS — Z89511 Acquired absence of right leg below knee: Secondary | ICD-10-CM

## 2019-05-18 DIAGNOSIS — R110 Nausea: Secondary | ICD-10-CM | POA: Diagnosis not present

## 2019-05-18 DIAGNOSIS — M48061 Spinal stenosis, lumbar region without neurogenic claudication: Secondary | ICD-10-CM | POA: Diagnosis present

## 2019-05-18 LAB — RENAL FUNCTION PANEL
Albumin: 2.1 g/dL — ABNORMAL LOW (ref 3.5–5.0)
Anion gap: 16 — ABNORMAL HIGH (ref 5–15)
BUN: 46 mg/dL — ABNORMAL HIGH (ref 8–23)
CO2: 23 mmol/L (ref 22–32)
Calcium: 8.4 mg/dL — ABNORMAL LOW (ref 8.9–10.3)
Chloride: 99 mmol/L (ref 98–111)
Creatinine, Ser: 8.65 mg/dL — ABNORMAL HIGH (ref 0.61–1.24)
GFR calc Af Amer: 7 mL/min — ABNORMAL LOW (ref >60)
GFR calc non Af Amer: 6 mL/min — ABNORMAL LOW (ref >60)
Glucose, Bld: 95 mg/dL (ref 70–99)
Phosphorus: 6.4 mg/dL — ABNORMAL HIGH (ref 2.5–4.6)
Potassium: 3.9 mmol/L (ref 3.5–5.1)
Sodium: 138 mmol/L (ref 135–145)

## 2019-05-18 LAB — VANCOMYCIN, RANDOM: Vancomycin Rm: 27

## 2019-05-18 LAB — CBC
HCT: 24.3 % — ABNORMAL LOW (ref 39.0–52.0)
Hemoglobin: 7.6 g/dL — ABNORMAL LOW (ref 13.0–17.0)
MCH: 31.9 pg (ref 26.0–34.0)
MCHC: 31.3 g/dL (ref 30.0–36.0)
MCV: 102.1 fL — ABNORMAL HIGH (ref 80.0–100.0)
Platelets: 102 10*3/uL — ABNORMAL LOW (ref 150–400)
RBC: 2.38 MIL/uL — ABNORMAL LOW (ref 4.22–5.81)
RDW: 14.6 % (ref 11.5–15.5)
WBC: 6.3 10*3/uL (ref 4.0–10.5)
nRBC: 0 % (ref 0.0–0.2)

## 2019-05-18 MED ORDER — SENNOSIDES-DOCUSATE SODIUM 8.6-50 MG PO TABS
1.0000 | ORAL_TABLET | Freq: Every evening | ORAL | Status: DC | PRN
  Administered 2019-05-20 – 2019-05-25 (×2): 1 via ORAL
  Filled 2019-05-18 (×2): qty 1

## 2019-05-18 MED ORDER — AMIODARONE HCL 200 MG PO TABS
200.0000 mg | ORAL_TABLET | Freq: Every day | ORAL | Status: DC
  Administered 2019-05-19 – 2019-05-28 (×8): 200 mg via ORAL
  Filled 2019-05-18 (×9): qty 1

## 2019-05-18 MED ORDER — SORBITOL 70 % SOLN: 30.0000 mL | Freq: Every day | Status: DC | PRN

## 2019-05-18 MED ORDER — HEPARIN SODIUM (PORCINE) 5000 UNIT/ML IJ SOLN
5000.0000 [IU] | Freq: Three times a day (TID) | INTRAMUSCULAR | Status: DC
  Administered 2019-05-18 – 2019-05-27 (×22): 5000 [IU] via SUBCUTANEOUS
  Filled 2019-05-18 (×27): qty 1

## 2019-05-18 MED ORDER — VITAMIN B-12 1000 MCG PO TABS
1000.0000 ug | ORAL_TABLET | Freq: Every evening | ORAL | Status: DC
  Administered 2019-05-18 – 2019-05-27 (×9): 1000 ug via ORAL
  Filled 2019-05-18 (×10): qty 1

## 2019-05-18 MED ORDER — DOCUSATE SODIUM 100 MG PO CAPS
100.0000 mg | ORAL_CAPSULE | Freq: Every day | ORAL | Status: DC
  Administered 2019-05-19 – 2019-05-25 (×6): 100 mg via ORAL
  Filled 2019-05-18 (×7): qty 1

## 2019-05-18 MED ORDER — PANTOPRAZOLE SODIUM 40 MG PO TBEC
40.0000 mg | DELAYED_RELEASE_TABLET | Freq: Every day | ORAL | Status: DC
  Administered 2019-05-19 – 2019-05-28 (×10): 40 mg via ORAL
  Filled 2019-05-18 (×10): qty 1

## 2019-05-18 MED ORDER — CLOPIDOGREL BISULFATE 75 MG PO TABS
75.0000 mg | ORAL_TABLET | Freq: Every day | ORAL | Status: DC
  Administered 2019-05-18 – 2019-05-27 (×9): 75 mg via ORAL
  Filled 2019-05-18 (×10): qty 1

## 2019-05-18 MED ORDER — VANCOMYCIN HCL IN DEXTROSE 750-5 MG/150ML-% IV SOLN: 750.0000 mg | INTRAVENOUS | Status: DC

## 2019-05-18 MED ORDER — OXYCODONE HCL 5 MG PO TABS
5.0000 mg | ORAL_TABLET | ORAL | Status: DC | PRN
  Administered 2019-05-18 – 2019-05-20 (×6): 10 mg via ORAL
  Administered 2019-05-21: 5 mg via ORAL
  Administered 2019-05-22 – 2019-05-23 (×2): 10 mg via ORAL
  Administered 2019-05-23 – 2019-05-25 (×2): 5 mg via ORAL
  Administered 2019-05-27 (×2): 10 mg via ORAL
  Filled 2019-05-18: qty 1
  Filled 2019-05-18 (×14): qty 2

## 2019-05-18 MED ORDER — VANCOMYCIN HCL IN DEXTROSE 750-5 MG/150ML-% IV SOLN
INTRAVENOUS | Status: AC
  Filled 2019-05-18: qty 150

## 2019-05-18 MED ORDER — DARBEPOETIN ALFA 100 MCG/0.5ML IJ SOSY
100.0000 ug | PREFILLED_SYRINGE | INTRAMUSCULAR | Status: DC
  Administered 2019-05-22: 100 ug via INTRAVENOUS
  Filled 2019-05-18: qty 0.5

## 2019-05-18 MED ORDER — METHOCARBAMOL 500 MG PO TABS
500.0000 mg | ORAL_TABLET | Freq: Two times a day (BID) | ORAL | Status: DC | PRN
  Administered 2019-05-18 – 2019-05-19 (×2): 500 mg via ORAL
  Filled 2019-05-18 (×2): qty 1

## 2019-05-18 MED ORDER — BISACODYL 5 MG PO TBEC: 5.0000 mg | DELAYED_RELEASE_TABLET | Freq: Every day | ORAL | Status: DC | PRN

## 2019-05-18 MED ORDER — ACETAMINOPHEN 650 MG RE SUPP: 325.0000 mg | RECTAL | Status: DC | PRN

## 2019-05-18 MED ORDER — ATORVASTATIN CALCIUM 80 MG PO TABS
80.0000 mg | ORAL_TABLET | Freq: Every day | ORAL | Status: DC
  Administered 2019-05-18 – 2019-05-27 (×9): 80 mg via ORAL
  Filled 2019-05-18 (×10): qty 1

## 2019-05-18 MED ORDER — LORAZEPAM 0.5 MG PO TABS: 1.0000 mg | ORAL_TABLET | Freq: Two times a day (BID) | ORAL | Status: DC | PRN

## 2019-05-18 MED ORDER — ACETAMINOPHEN 325 MG PO TABS
325.0000 mg | ORAL_TABLET | ORAL | Status: DC | PRN
  Filled 2019-05-18: qty 2

## 2019-05-18 MED ORDER — RENA-VITE PO TABS
1.0000 | ORAL_TABLET | Freq: Every day | ORAL | Status: DC
  Administered 2019-05-18 – 2019-05-27 (×10): 1 via ORAL
  Filled 2019-05-18 (×10): qty 1

## 2019-05-18 MED ORDER — SUCROFERRIC OXYHYDROXIDE 500 MG PO CHEW
1500.0000 mg | CHEWABLE_TABLET | Freq: Three times a day (TID) | ORAL | Status: DC
  Administered 2019-05-18 – 2019-05-26 (×10): 1500 mg via ORAL
  Filled 2019-05-18 (×30): qty 3

## 2019-05-18 MED ORDER — CALCITRIOL 0.25 MCG PO CAPS
1.5000 ug | ORAL_CAPSULE | ORAL | Status: DC
  Administered 2019-05-22 – 2019-05-27 (×2): 1.5 ug via ORAL

## 2019-05-18 MED ORDER — VANCOMYCIN HCL IN DEXTROSE 500-5 MG/100ML-% IV SOLN
500.0000 mg | Freq: Once | INTRAVENOUS | Status: DC
  Filled 2019-05-18: qty 100

## 2019-05-18 MED ORDER — MIDODRINE HCL 5 MG PO TABS
10.0000 mg | ORAL_TABLET | ORAL | Status: DC
  Administered 2019-05-27: 10 mg via ORAL

## 2019-05-18 MED ORDER — CARVEDILOL 3.125 MG PO TABS
3.1250 mg | ORAL_TABLET | Freq: Two times a day (BID) | ORAL | Status: DC
  Administered 2019-05-18 – 2019-05-28 (×18): 3.125 mg via ORAL
  Filled 2019-05-18 (×20): qty 1

## 2019-05-18 MED ORDER — HEPARIN SODIUM (PORCINE) 5000 UNIT/ML IJ SOLN: 5000.0000 [IU] | Freq: Three times a day (TID) | INTRAMUSCULAR | Status: DC

## 2019-05-18 MED ORDER — VANCOMYCIN HCL IN DEXTROSE 750-5 MG/150ML-% IV SOLN
750.0000 mg | INTRAVENOUS | Status: DC
  Administered 2019-05-22 – 2019-05-27 (×3): 750 mg via INTRAVENOUS
  Filled 2019-05-18 (×4): qty 150

## 2019-05-18 NOTE — Progress Notes (Signed)
[facility] Progress Note  Subjective: pt seen on HD, no c/o  Vitals:   05/18/19 1130 05/18/19 1138 05/18/19 1143 05/18/19 1223  BP: (!) 125/56 (!) 143/63 (!) 150/70 137/60  Pulse: 69 70 71   Resp:   18 19  Temp:   98.2 F (36.8 C) 98.1 F (36.7 C)  TempSrc:   Oral Oral  SpO2:   96% 96%  Weight:   68.2 kg   Height:        Exam: General:Well developed, well nourished male. Alert and in NAD Heart:RRR, no murmurs, rubs or gallops Lungs:CTA bilaterally without wheezing, rhonchi or rales Abdomen:Soft, non-tender, non-distended. +BS Extremities:RLE BKA, LLE without edema Dialysis Access:RUE AVF + thrill/bruit     Dialysis: MWF [facility]   4h  400/800  76.5kg  2/2 bath  P4  RUE AVF  Hep 2400  - Parsabiv 5mg  IV q HD  - Calcitriol 1.73mcg PO q HD  - Mircera 80mcg IV q 2 weeks (last given 67mcg on 1/20, Hgb 10.3 on 2/10)    Assessment/ Plan: 1. PAD w/ gangrenous changes -s/p R TMA on 2/1 healing poorly so s/p R BKA on 2/25. VVS following 2. Sepsis: +BC w/enterococcus, onvanomycin&improving.Plan for vanc for 4 weeks after last negative culture.Etiology unclear, incision sites clean per VVS, CXR w/o active dz, no urine available to culture. ID following. TEE shows normal appearing AV prothesis, no vegetation.  3. ESRD- on MWF HD.  HD today.  4. Anemiaof CKD-Hgb 7.7.Aranesp q Friday. tsat 35%, no Fe needed. Transfuse prn.  5. Secondary hyperparathyroidism- Ca 8.2, phos 5.4 w/increased Velphoro dose(3/2), continue binders and VDRA.  6. HTN/volume- Blood pressurevariable. Looks euvolemic. Needs lowering of dry at d/c.  7. Nutrition- Alb 2.2, Renal diet w/fluid restrictions, protein supplements, Vit 8. Hx TAVR 9. CAD/HFrEF 10. Dispo: pending CIR     [name] Kacie Huxtable 05/18/2019, 1:11 PM   Recent Labs  Lab 05/15/19 0217 05/15/19 0722 05/17/19 0218 05/18/19 0240 05/18/19 0626  K  --    < > 3.4* 3.9  --   BUN  --    < > 38* 46*  --   CREATININE   --    < > 7.23* 8.65*  --   CALCIUM  --    < > 8.2* 8.4*  --   PHOS  --    < > 5.4* 6.4*  --   HGB 7.7*  --   --   --  7.6*   < > = values in this interval not displayed.   Inpatient medications: . amiodarone  200 mg Oral Daily  . atorvastatin  80 mg Oral Q supper  . calcitRIOL  1.5 mcg Oral Q M,W,F-HD  . carvedilol  3.125 mg Oral BID  . Chlorhexidine Gluconate Cloth  6 each Topical Q0600  . clopidogrel  75 mg Oral Q supper  . darbepoetin (ARANESP) injection - DIALYSIS  100 mcg Intravenous Q Fri-HD  . docusate sodium  100 mg Oral Daily  . heparin  5,000 Units Subcutaneous Q8H  . midodrine  10 mg Oral Q M,W,F-HD  . multivitamin  1 tablet Oral QHS  . pantoprazole  40 mg Oral Daily  . sodium chloride flush  3 mL Intravenous Q12H  . sucroferric oxyhydroxide  1,500 mg Oral TID WC  . vitamin B-12  1,000 mcg Oral QPM   . sodium chloride    . sodium chloride Stopped (05/14/19 2117)  . magnesium sulfate bolus IVPB    . [START  ON 05/20/2019] vancomycin     sodium chloride, acetaminophen **OR** acetaminophen, alum & mag hydroxide-simeth, bisacodyl, guaiFENesin-dextromethorphan, hydrALAZINE, HYDROmorphone (DILAUDID) injection, labetalol, LORazepam, magnesium sulfate bolus IVPB, methocarbamol, metoprolol tartrate, ondansetron, oxyCODONE, phenol, prochlorperazine, senna-docusate, sodium chloride flush, temazepam

## 2019-05-18 NOTE — Progress Notes (Signed)
Pt received  from HD. VSS. Call light in reach. Lunch tray ordered.  ? ?Pearlene Teat, RN ? ?

## 2019-05-18 NOTE — Progress Notes (Signed)
Patient admitted to IP rehab during shift, informed of safety policy and visitor policy. No further questions at time.  [name], LPN

## 2019-05-18 NOTE — Progress Notes (Signed)
Kirsteins, Luanna Salk, MD  Physician  Physical Medicine and Rehabilitation  PMR Pre-admission  Signed  Date of Service:  05/18/2019 11:45 AM      Related encounter: Admission (Discharged) from 04/29/2019 in [facility] 4E CV SURGICAL PROGRESSIVE CARE      Signed        Show:Clear all _0 Manual_1 Template_2 Copied  Added by: _3 Cristina Gong, RN_4 Kirsteins, Luanna Salk, MD  _5 Hover for details PMR Admission Coordinator Pre-Admission Assessment   Patient: Jose Garrett is an 69 y.o., male MRN: [id number] DOB: 1951-01-05 Height: (pt refused bath. ) Weight: 68 kg   Insurance Information HMO:     PPO:      PCP:      IPA:      80/20:      OTHER:  PRIMARY: [employer] Medicare      Policy#: [id number]      Subscriber: pt CM Name: [facility]      Phone#: [phone] general number     Fax#: [phone] Pre-Cert#: [id number] approved for 7 days   Initially denied but overturned on appeal   Employer:  Benefits:  Phone #: [phone]     Name: 3/5 Eff. Date: 04/13/2019     Deduct: none      Out of Pocket Max: $3600      Life Max: none CIR: $295 co pay per day days 1 until 5      SNF: no copay days 1 until 20; $184 co pay per day days 21 until 40; no copay days 41 until 100 Outpatient: $30 per visit      Co-Pay: visits per medical neccesity Home Health: 100%      Co-Pay: visits per medical neccesity DME: 9-%     Co-Pay: 20% Providers: in network  SECONDARY: none       Medicaid Application Date:       Case Manager:  Disability Application Date:       Case Worker:    The "Data Collection Information Summary" for patients in Inpatient Rehabilitation Facilities with attached "Privacy Act [facility] Records" was provided and verbally reviewed with: Patient   Emergency Contact Information         Contact Information     Name Relation Home Work Fowler Daughter     [phone]    Jeanene Erb [phone]   [phone]         Current Medical  History  Patient Admitting Diagnosis: BKA History of Present Illness:  69 year old right-handed male with history of CAD status post TAVR procedure August 2020 maintained on Plavix as well as amiodarone, chronic combined systolic and diastolic congestive heart failure.  End-stage renal disease with hemodialysis Monday Wednesday and Friday [facility], hypertension, hyperlipidemia, quit smoking 8 years ago.  Patient did receive inpatient rehab services 07/28/2018 to 08/13/2018 for debility related to PAD status post left common femoral to below-knee popliteal artery bypass and discharged to home contact-guard assist mobility.  Presented 04/29/2019 for gangrenous changes of the right foot as well as recent right lower extremity bypass and no change with conservative care undergoing right transmetatarsal amputation 04/29/2019 per Dr. Donzetta Matters.  Postoperatively with plantar flap becoming necrotic returning back to the OR for revision of right transmetatarsal amputation removal of necrotic tissue 05/04/2018 unfortunately with progressive gangrenous necrotic changes and ultimately underwent right BKA 05/06/2018 per Dr Trula Slade..  Hemodialysis ongoing as per renal services.  Patient doing well until 05/11/2019 isolated fever of 103 blood cultures grew Enterococcus placed on  vancomycin x4 weeks through 06/10/2019.  Infectious disease consulted with TEE to evaluate TAVR showed no vegetation and no valvular AI.[name]  Acute on chronic anemia 7.7 and monitored.  Subcutaneous heparin for DVT prophylaxis.     Patient's medical record from [facility]  has been reviewed by the rehabilitation admission coordinator and physician.   Past Medical History      Past Medical History:  Diagnosis Date  . Amyloidosis ([place])    . Anemia of chronic disease    . Anxiety    . CAD (coronary artery disease)      a. cardiac cath on 11/25/17 with occlusion of the RCA which could have been his event but the RCA could not be engaged. The RCA filled  distally from left to right collaterals. There were no severe stenoses in the left coronary system. Medical therapy recommended, started on plavix.  . Chronic combined systolic and diastolic CHF (congestive heart failure) ([place])      a. EF dropped to 35-40% with grade 2 DD by echo 11/2017.  [name] DJD (degenerative joint disease), lumbar    . ESRD (end stage renal disease) ([place])      ESRD- HEMO MWF- [facility]  . GERD (gastroesophageal reflux disease)    . Hyperlipidemia    . Hypertension    . Myocardial infarction ([place])    . Neuropathy    . Peripheral vascular disease ([place])    . Pneumonia    . Restless legs    . S/P TAVR (transcatheter aortic valve replacement) 10/21/2018    26 mm Edwards Sapien 3 Ultra transcatheter heart valve placed via percutaneous right transfemoral approach   . Severe aortic stenosis      severe      Family History   family history includes Diabetes in his father, mother, and sister; Heart disease in his mother; Hypertension in his father and mother; Skin cancer in his mother.   Prior Rehab/Hospitalizations Has the patient had prior rehab or hospitalizations prior to admission? Yes   Has the patient had major surgery during 100 days prior to admission? Yes              Current Medications   Current Facility-Administered Medications:  .  0.9 %  sodium chloride infusion, 250 mL, Intravenous, PRN, Fay Records, MD .  0.9 %  sodium chloride infusion, , Intravenous, Continuous, Fay Records, MD, Stopped at 05/14/19 2117 .  acetaminophen (TYLENOL) tablet 325-650 mg, 325-650 mg, Oral, Q4H PRN, 650 mg at 05/11/19 1302 **OR** acetaminophen (TYLENOL) suppository 325-650 mg, 325-650 mg, Rectal, Q4H PRN, Fay Records, MD .  alum & mag hydroxide-simeth (MAALOX/MYLANTA) 200-200-20 MG/5ML suspension 15-30 mL, 15-30 mL, Oral, Q2H PRN, Fay Records, MD .  amiodarone (PACERONE) tablet 200 mg, 200 mg, Oral, Daily, Dorris Carnes V, MD, 200 mg at 05/17/19 0940 .  atorvastatin  (LIPITOR) tablet 80 mg, 80 mg, Oral, Q supper, Fay Records, MD, 80 mg at 05/17/19 1718 .  bisacodyl (DULCOLAX) EC tablet 5 mg, 5 mg, Oral, Daily PRN, Fay Records, MD, 5 mg at 05/09/19 2215 .  calcitRIOL (ROCALTROL) capsule 1.5 mcg, 1.5 mcg, Oral, Q M,W,F-HD, Fay Records, MD, 1.5 mcg at 05/15/19 1217 .  carvedilol (COREG) tablet 3.125 mg, 3.125 mg, Oral, BID, Fay Records, MD, 3.125 mg at 05/17/19 2126 .  Chlorhexidine Gluconate Cloth 2 % PADS 6 each, 6 each, Topical, Q0600, Janalee Dane, PA-C, 6 each at 05/18/19 0604 .  clopidogrel (PLAVIX) tablet 75 mg, 75 mg, Oral, Q supper, Fay Records, MD, 75 mg at 05/17/19 1718 .  Darbepoetin Alfa (ARANESP) injection 100 mcg, 100 mcg, Intravenous, Q Fri-HD, Fay Records, MD, 100 mcg at 05/15/19 1218 .  docusate sodium (COLACE) capsule 100 mg, 100 mg, Oral, Daily, Fay Records, MD, 100 mg at 05/17/19 0939 .  guaiFENesin-dextromethorphan (ROBITUSSIN DM) 100-10 MG/5ML syrup 15 mL, 15 mL, Oral, Q4H PRN, Fay Records, MD .  heparin injection 5,000 Units, 5,000 Units, Subcutaneous, Q8H, Fay Records, MD, 5,000 Units at 05/18/19 0604 .  hydrALAZINE (APRESOLINE) injection 5 mg, 5 mg, Intravenous, Q20 Min PRN, Dorris Carnes V, MD .  HYDROmorphone (DILAUDID) injection 0.5-1 mg, 0.5-1 mg, Intravenous, Q2H PRN, Fay Records, MD, 1 mg at 05/06/19 0954 .  labetalol (NORMODYNE) injection 10 mg, 10 mg, Intravenous, Q10 min PRN, Fay Records, MD .  LORazepam (ATIVAN) tablet 1 mg, 1 mg, Oral, BID PRN, Fay Records, MD, 1 mg at 05/07/19 0951 .  magnesium sulfate IVPB 2 g 50 mL, 2 g, Intravenous, Daily PRN, Fay Records, MD .  methocarbamol (ROBAXIN) tablet 500 mg, 500 mg, Oral, BID PRN, Fay Records, MD, 500 mg at 05/07/19 1054 .  metoprolol tartrate (LOPRESSOR) injection 2-5 mg, 2-5 mg, Intravenous, Q2H PRN, Fay Records, MD .  midodrine (PROAMATINE) tablet 10 mg, 10 mg, Oral, Q M,W,F-HD, Fay Records, MD, 10 mg at 05/11/19 1515 .  multivitamin  (RENA-VIT) tablet 1 tablet, 1 tablet, Oral, QHS, Fay Records, MD, 1 tablet at 05/17/19 2127 .  ondansetron (ZOFRAN) injection 4 mg, 4 mg, Intravenous, Q6H PRN, Fay Records, MD, 4 mg at 05/17/19 1323 .  oxyCODONE (Oxy IR/ROXICODONE) immediate release tablet 5-10 mg, 5-10 mg, Oral, Q4H PRN, Fay Records, MD, 5 mg at 05/16/19 2158 .  pantoprazole (PROTONIX) EC tablet 40 mg, 40 mg, Oral, Daily, Fay Records, MD, 40 mg at 05/17/19 0939 .  phenol (CHLORASEPTIC) mouth spray 1 spray, 1 spray, Mouth/Throat, PRN, Fay Records, MD .  prochlorperazine (COMPAZINE) injection 10 mg, 10 mg, Intravenous, Q6H PRN, Fay Records, MD .  senna-docusate (Senokot-S) tablet 1 tablet, 1 tablet, Oral, QHS PRN, Fay Records, MD .  sodium chloride flush (NS) 0.9 % injection 3 mL, 3 mL, Intravenous, Q12H, Fay Records, MD, 3 mL at 05/17/19 2129 .  sodium chloride flush (NS) 0.9 % injection 3 mL, 3 mL, Intravenous, PRN, Fay Records, MD .  sucroferric oxyhydroxide [facility]) chewable tablet 1,500 mg, 1,500 mg, Oral, TID WC, Fay Records, MD, 1,500 mg at 05/16/19 0929 .  temazepam (RESTORIL) capsule 15 mg, 15 mg, Oral, QHS PRN, Fay Records, MD, 15 mg at 05/17/19 2127 .  vancomycin (VANCOCIN) IVPB 500 mg/100 ml premix, 500 mg, Intravenous, Once, Kris Mouton, [facility] .  [START ON 05/20/2019] vancomycin (VANCOCIN) IVPB 750 mg/150 ml premix, 750 mg, Intravenous, Q M,W,F-HD, Kris Mouton, RPH .  vitamin B-12 (CYANOCOBALAMIN) tablet 1,000 mcg, 1,000 mcg, Oral, QPM, Fay Records, MD, 1,000 mcg at 05/17/19 1718   Patients Current Diet:     Diet Order                      Diet renal with fluid restriction Fluid restriction: 1200 mL Fluid; Room service appropriate? Yes; Fluid consistency: Thin  Diet effective now  Precautions / Restrictions Precautions Precautions: Fall Other Brace: darco shoe R  Restrictions Weight Bearing Restrictions: Yes RLE Weight Bearing: Non weight bearing RLE  Partial Weight Bearing Percentage or Pounds: wieght bearing through heel with darco shoe Other Position/Activity Restrictions: (shoe not present during eval, maintained NWB)    Has the patient had 2 or more falls or a fall with injury in the past year? No   Prior Activity Level Limited Community (1-2x/wk): Mod I ; not driving; lived alone   Prior Functional Level Self Care: Did the patient need help bathing, dressing, using the toilet or eating? Independent   Indoor Mobility: Did the patient need assistance with walking from room to room (with or without device)? Independent   Stairs: Did the patient need assistance with internal or external stairs (with or without device)? Independent   Functional Cognition: Did the patient need help planning regular tasks such as shopping or remembering to take medications? [place] / Equipment Home Assistive Devices/Equipment: Chana Bode (specify type), Wheelchair Home Equipment: Cane - single point, Environmental consultant - 2 wheels, Bedside commode, Wheelchair - manual   Prior Device Use: Indicate devices/aids used by the patient prior to current illness, exacerbation or injury? Walker   Current Functional Level Cognition   Overall Cognitive Status: Within Functional Limits for tasks assessed Orientation Level: Oriented X4 Safety/Judgement: Decreased awareness of safety General Comments: wfls for OT session 3/3    Extremity Assessment (includes Sensation/Coordination)   Upper Extremity Assessment: Generalized weakness(LUE sore after HD)  Lower Extremity Assessment: Defer to PT evaluation RLE Deficits / Details: pts right leg in KI - pt complaining about not being able to keep it straight.  pt wtih 20 degrees knee flexion RLE: Unable to fully assess due to pain RLE Sensation: decreased light touch LLE Deficits / Details: left leg weak - pt 3-/5 quad.  Pt reports right leg was his strong leg LLE Sensation: decreased light  touch     ADLs   Overall ADL's : Needs assistance/impaired Eating/Feeding: Independent Grooming: Set up, Sitting Grooming Details (indicate cue type and reason): intermittent min guard initially progressing to supervision for balance Upper Body Bathing: Set up, Sitting Lower Body Bathing: Moderate assistance, Maximal assistance, Sitting/lateral leans, Sit to/from stand Lower Body Bathing Details (indicate cue type and reason): due to difficulty standing s/p R BKA Upper Body Dressing : Set up, Sitting Lower Body Dressing: Maximal assistance, Sitting/lateral leans Lower Body Dressing Details (indicate cue type and reason): don sock to L foot Toilet Transfer: Minimal assistance, RW(from high bed; assist to control descent) Toilet Transfer Details (indicate cue type and reason): simulated with mobility and transfer to recliner, improved ability to maintain weight bearing through L LE and hop to perform transfer Functional mobility during ADLs: Minimal assistance, Moderate assistance, +2 for safety/equipment, Rolling walker, Cueing for sequencing, Cueing for safety General ADL Comments: sat EOB for several minutes then stood for about 30 seconds prior to transferring to chair     Mobility   Overal bed mobility: Needs Assistance Bed Mobility: Supine to Sit Rolling: Mod assist Supine to sit: Min guard, HOB elevated Sit to supine: Supervision General bed mobility comments: increased time and effort; use of rail     Transfers   Overall transfer level: Needs assistance Equipment used: Rolling walker (2 wheeled) Transfers: Sit to/from Stand Sit to Stand: Mod assist Stand pivot transfers: Min assist, +2 safety/equipment General transfer comment: assist to power up into standing from EOB and recliner; cues  for hand placement     Ambulation / Gait / Stairs / Wheelchair Mobility   Ambulation/Gait Ambulation/Gait assistance: Min assist, +2 safety/equipment Gait Distance (Feet): 25  Feet Assistive device: Rolling walker (2 wheeled) Gait Pattern/deviations: Step-to pattern General Gait Details: cues for sequencing/upright posture; assist to steady and guide RW; L shoe donned and pt with improved step length and foot clearance Gait velocity: decreased Gait velocity interpretation: <1.31 ft/sec, indicative of household ambulator     Posture / Balance Dynamic Sitting Balance Sitting balance - Comments: patient able to maintain stability at edge of bed this session Balance Overall balance assessment: Needs assistance Sitting-balance support: No upper extremity supported, Feet supported Sitting balance-Leahy Scale: Good Sitting balance - Comments: patient able to maintain stability at edge of bed this session Standing balance support: Bilateral upper extremity supported, During functional activity Standing balance-Leahy Scale: Poor Standing balance comment: reliant on B UE support and min A x2 however able to bear weight through L LE this session     Special needs/care consideration BiPAP/CPAP  CPM  Continuous Drip IV  Dialysis MWF hemodialysis; daughter transports Life Vest  No longer uses as LIFE vest Oxygen  Special Bed  Trach Size  Wound Vac  Skin R BKA surgical site; MASD to buttocks; Stage 2 to sacrum Bowel mgmt: continent Bladder mgmt:  anuric Diabetic mgmt:  Behavioral consideration  Chemo/radiation  Designated visitor is daughter, [name]    Previous Home Environment  Living Arrangements: Alone(has stayed with his exwife before returning home alone)  Lives With: Alone Available Help at Discharge: (daughter, [name] and his exwife, Lawyer) Type of Home: House Home Layout: One level Home Access: Stairs to enter, Ramped entrance Entrance Stairs-Rails: Left Entrance Stairs-Number of Steps: 1 Bathroom Shower/Tub: Chiropodist: Standard Bathroom Accessibility: Yes How Accessible: Accessible via walker Home Care Services:  No Additional Comments: when asked today if patient lives with anyone states he lives alone but that his daughter and ex-wife would help him after discharge   Discharge Living Setting Plans for Discharge Living Setting: Patient's home, Alone Type of Home at Discharge: House Discharge Home Layout: One level Discharge Home Access: Stairs to enter Entrance Stairs-Rails: Left Entrance Stairs-Number of Steps: 1 Discharge Bathroom Shower/Tub: Tub/shower unit Discharge Bathroom Toilet: Standard Discharge Bathroom Accessibility: Yes How Accessible: Accessible via walker Does the patient have any problems obtaining your medications?: No   Social/Family/Support Systems Patient Roles: Parent Contact Information: Erin, Daughter Anticipated Caregiver: [name] and her Mom, [name]. Patient has been divorced from Altha for 38 years Anticipated Caregiver's Contact Information: see above Ability/Limitations of Caregiver: [name] works days but [name], can come stay with him when [name] works Caregiver Availability: 24/7 Discharge Plan Discussed with Primary Caregiver: Yes Is Caregiver In Agreement with Plan?: Yes Does Caregiver/Family have Issues with Lodging/Transportation while Pt is in Rehab?: Yes   Goals/Additional Needs Patient/Family Goal for Rehab: Mod I to supervision  with PT and OT at wheelcahir level Expected length of stay: ELOS 7 to 10 days Equipment Needs: Hemodialysis MWF; daughter transports Pt/Family Agrees to Admission and willing to participate: Yes Program Orientation Provided & Reviewed with Pt/Caregiver Including Roles  & Responsibilities: Yes   Decrease burden of Care through IP rehab admission:    Possible need for SNF placement upon discharge:    Patient Condition: I have reviewed medical records from [facility] , spoken with CM, and patient and daughter. I met with patient at the bedside for inpatient rehabilitation assessment.  Patient  will benefit from ongoing PT and  OT, can actively participate in 3 hours of therapy a day 5 days of the week, and can make measurable gains during the admission.  Patient will also benefit from the coordinated team approach during an Inpatient Acute Rehabilitation admission.  The patient will receive intensive therapy as well as Rehabilitation physician, nursing, social worker, and care management interventions.  Due to bladder management, bowel management, safety, skin/wound care, disease management, medication administration, pain management and patient education the patient requires 24 hour a day rehabilitation nursing.  The patient is currently min to mod assist with mobility and basic ADLs.  Discharge setting and therapy post discharge at home with home health is anticipated.  Patient has agreed to participate in the Acute Inpatient Rehabilitation Program and will admit today.   Preadmission Screen Completed By:  Cleatrice Burke, 05/18/2019 11:45 AM ______________________________________________________________________   Discussed status with Dr. Letta Pate  on  05/18/2019 at  1155 and received approval for admission today.   Admission Coordinator:  Cleatrice Burke, RN, time  1155 Date  05/18/2019    Assessment/Plan: Diagnosis: Right below-knee amputation due to right foot gangrene 1. Does the need for close, 24 hr/day Medical supervision in concert with the patient's rehab needs make it unreasonable for this patient to be served in a less intensive setting? Yes 2. Co-Morbidities requiring supervision/potential complications: End-stage renal disease on hemodialysis, bacteremia on IV vancomycin, postoperative pain management, transition from IV to p.o. medications 3. Due to bladder management, bowel management, safety, skin/wound care, disease management, medication administration, pain management and patient education, does the patient require 24 hr/day rehab nursing? Yes 4. Does the patient require coordinated care of a  physician, rehab nurse, PT, OT, and SLP to address physical and functional deficits in the context of the above medical diagnosis(es)? Yes Addressing deficits in the following areas: balance, endurance, locomotion, strength, transferring, bowel/bladder control, bathing, dressing, feeding, grooming, toileting, cognition and psychosocial support 5. Can the patient actively participate in an intensive therapy program of at least 3 hrs of therapy 5 days a week? Yes 6. The potential for patient to make measurable gains while on inpatient rehab is good 7. Anticipated functional outcomes upon discharge from inpatient rehab: modified independent PT, modified independent OT, n/a SLP 8. Estimated rehab length of stay to reach the above functional goals is: 7 to 10 days 9. Anticipated discharge destination: Home 10. Overall Rehab/Functional Prognosis: good     MD Signature: Charlett Blake M.D. [facility] FAAPM&R (Neuromuscular Med) Diplomate Am Board of Electrodiagnostic Med Fellow Am Board of Interventional Pain          Revision History

## 2019-05-18 NOTE — Progress Notes (Signed)
OT Cancellation Note  Patient Details Name: Jose Garrett MRN: [id number] DOB: May 06, 1950   Cancelled Treatment:    Reason Eval/Treat Not Completed: Patient at procedure or test/ unavailable. Pt at HD, will try back this afternoon  Britt Bottom 05/18/2019, 10:19 AM

## 2019-05-18 NOTE — Progress Notes (Signed)
Pharmacy Antibiotic Note  Jose Garrett is a 69 y.o. male admitted on 04/29/2019 with Enterococcal bacteremia.  Pharmacy has been consulted for vancomycin dosing. Patient's enterococcus is Amp-S but switching to Vanc for ease of dosing as an outpatient. He is noted to have healing at site of amputation. WBC are wnl at 6.6 (last on 3/5). He is afebrile. ID is following and recommends 4 weeks of treatment through March 31st.  Last hemodialysis session was on 3/5 and was 3 hours at BFR of 400 and he did receive 750 mg of vancomycin after this session. -pre HD vancomycin level was 27 today   Microbiology results:  3/1 BCx: e. Faecalis R to gent, sensitive to ampicillin and vancomycin  3/4 BCx: ngtd   Plan: -Vancomycin 500mg  after HD today then continue 750 mg post HD MWF -Plan 4 weeks of therapy through 3/31 -Will follow HD schedule if supplemental sessions are needed   Height: (pt refused bath. ) Weight: 149 lb 14.6 oz (68 kg) IBW/kg (Calculated) : 73  Temp (24hrs), Avg:98 F (36.7 C), Min:97.4 F (36.3 C), Max:98.3 F (36.8 C)  Recent Labs  Lab 05/12/19 0256 05/13/19 0734 05/13/19 0955 05/14/19 1208 05/15/19 0217 05/15/19 0722 05/16/19 1142 05/17/19 0218 05/18/19 0240 05/18/19 0626  WBC 11.3*  --  7.9  --  6.6  --   --   --   --  6.3  CREATININE  --    < >  --  4.93*  --  6.22* 6.20* 7.23* 8.65*  --   VANCORANDOM  --   --   --   --   --   --   --   --  27  --    < > = values in this interval not displayed.    Estimated Creatinine Clearance: 8 mL/min (A) (by C-G formula based on SCr of 8.65 mg/dL (H)).    No Known Allergies   Thank you,   Hildred Laser, PharmD Clinical Pharmacist **Pharmacist phone directory can now be found on [email or web address] ([email or web address]).  Listed under [place].

## 2019-05-18 NOTE — Discharge Summary (Signed)
Discharge Summary    Jose Garrett Apr 10, 1950 69 y.o. male  [id number]  Admission Date: 04/29/2019  Discharge Date: 05/18/2019  Physician: Thomes Lolling*  Admission Diagnosis: PAD (peripheral artery disease) ([facility]) [I73.9]   HPI:   This is a 69 y.o. male with recent history of right lower extremity bypass.  He has progressive gangrenous changes of most of his toes and the dorsum of his foot.  He is indicated for transmetatarsal amputation.  Hospital Course:  The patient was admitted to the hospital and taken to the operating room on 04/29/2019 and underwent: Right TMA.    Findings: All tissue appeared healthy in the amputation bed.  The pt tolerated the procedure well and was transported to the PACU in good condition.   POD 1, pt was having significant pain in the right leg and right foot amp site.  TMA site looked good.    POD 2, pain had improved.  PT/OT recommending SNF at that time.  POD 4, pt site healing with doppler signals present right foot.  He did have some nausea that did resolve.  His abdominal x-ray was unremarkable.  He then had c/o diarrhea.  Dr. Scot Dock considered checking C diff, but not recommended unless 3 watery stools in 24 hours.    POD 5, feeling better and tolerating diet with diarrhea improved.  Pain well managed for a couple of days.   POD 6, pt started to feel poorly.  He did have a SNF bed available, but started having ischemic changes to his TMA site and it was malordorous.    He was taken back to the operating room on 05/05/2019 and underwent revision of right TMA with removal of necrotic tissue and partial closure.    Findings: Unfortunately plantar flap was mostly necrotic this was removed down to fairly superficial skin.  The distal end of the flap was completely removed.  There is tracking on the plantar surface which may communicate all the way back to the heel ulceration.  We were able to remove back to healthy bone and closed  the lateral aspect of the TMA and packed the medial aspect with wet-to-dry gauze.  05/06/2019, the dressing was removed and the foot had open area and wet to dry dressing applied.  Plan for next day for right BKA.    On 05/07/2019, he was taken to the operating room and underwent right BKA by Dr. Arita Miss.  Findings revealed healthy appearing tissue, which appeared well perfused.   05/09/2019, pt dressing was removed and stump was clean with no fluid collection or hematoma.  Dressing was replaced.  He did have acute blood loss anemia with hgb 7.5.  Continue to monitor.    05/10/2019, pt doing well.  PT/OT recommending CIR.  He did have fever and pt was pan cultured and started on empiric abx.  No obvious source of infection right lower extremity incision sites previous left lower extremity incisions are well-healed.  We will culture begin antibiotics.  05/12/2019, pt incision continues to heal nicely.  Small area of fibrinous tissue at the distal portion of groin incision and dry gauze placed.  This resolved.  He did have + blood cx with GPC (enterococcus).  Micro revealed E faecalis. He was doing well with straightening his knee.  ID consult obtained.  Pt was continued on Ampicillin.    05/14/2019, repeat blood cx ordered.  Pt for TEE.  Pt was accepted to CIR, but insurance denied and an appeal was filed.  TEE revealed:   TV normal  Trace TR MV is mildly thickened   Trivial MR AV prothesis appeared normal   NO valvular or perivalvular AI PV is difficult to see but there does not appear to be a vegetation.  No significant PI    LVEF and RVEF are normal   Mild fixed plaquing of the thoracic aorta     05/16/2019, TEE was negative.  Continue abx for 4 weeks per ID.  BKA site healing nicely.   05/17/2019, pt doing well and encouraged knee extension.   05/18/2019, pt CIR denial overturned and pt admitted to CIR.    The remainder of the hospital course consisted of increasing mobilization and increasing  intake of solids without difficulty.  CBC    Component Value Date/Time   WBC 6.3 05/18/2019 0626   RBC 2.38 (L) 05/18/2019 0626   HGB 7.6 (L) 05/18/2019 0626   HGB 11.4 (L) 11/12/2018 1637   HGB 10.8 (L) 03/14/2017 0921   HCT 24.3 (L) 05/18/2019 0626   HCT 35.0 (L) 11/12/2018 1637   HCT 32.5 (L) 03/14/2017 0921   PLT 102 (L) 05/18/2019 0626   PLT 51 (LL) 11/12/2018 1637   MCV 102.1 (H) 05/18/2019 0626   MCV 103 (H) 11/12/2018 1637   MCV 106.2 (H) 03/14/2017 0921   MCH 31.9 05/18/2019 0626   MCHC 31.3 05/18/2019 0626   RDW 14.6 05/18/2019 0626   RDW 14.6 11/12/2018 1637   RDW 14.6 03/14/2017 0921   LYMPHSABS 0.6 (L) 05/12/2019 0256   LYMPHSABS 1.0 11/12/2018 1637   LYMPHSABS 0.8 (L) 03/14/2017 0921   MONOABS 0.7 05/12/2019 0256   MONOABS 0.6 03/14/2017 0921   EOSABS 0.1 05/12/2019 0256   EOSABS 0.2 11/12/2018 1637   BASOSABS 0.1 05/12/2019 0256   BASOSABS 0.0 11/12/2018 1637   BASOSABS 0.0 03/14/2017 0921    BMET    Component Value Date/Time   NA 138 05/18/2019 0240   NA 135 07/14/2018 0909   NA 141 03/14/2017 0921   K 3.9 05/18/2019 0240   K 4.1 03/14/2017 0921   CL 99 05/18/2019 0240   CO2 23 05/18/2019 0240   CO2 33 (H) 03/14/2017 0921   GLUCOSE 95 05/18/2019 0240   GLUCOSE 144 (H) 03/14/2017 0921   BUN 46 (H) 05/18/2019 0240   BUN 76 (HH) 07/14/2018 0909   BUN 30.1 (H) 03/14/2017 0921   CREATININE 8.65 (H) 05/18/2019 0240   CREATININE 9.16 (HH) 09/17/2017 0954   CREATININE 8.1 (HH) 03/14/2017 0921   CALCIUM 8.4 (L) 05/18/2019 0240   CALCIUM 9.5 03/14/2017 0921   GFRNONAA 6 (L) 05/18/2019 0240   GFRNONAA 5 (L) 09/17/2017 0954   GFRNONAA 24 (L) 06/30/2014 1528   GFRAA 7 (L) 05/18/2019 0240   GFRAA 6 (L) 09/17/2017 0954   GFRAA 28 (L) 06/30/2014 1528      Discharge Instructions    Discharge patient   Complete by: As directed    Discharge to CIR   Discharge disposition: [facility] Not Defined   Discharge patient date:  05/18/2019      Discharge Diagnosis:  PAD (peripheral artery disease) ([facility]) [I73.9]  Secondary Diagnosis: Patient Active Problem List   Diagnosis Date Noted  . Pressure injury of skin 05/14/2019  . Enterococcal bacteremia 05/13/2019  . Polymorphic ventricular tachycardia ([place]) 10/24/2018  . S/P TAVR (transcatheter aortic valve replacement) 10/21/2018  . Anxiety   . Coronary artery disease involving native coronary artery of native heart without angina pectoris   .  Thrombocytopenia ([place])   . PAD (peripheral artery disease) ([place]) 07/22/2018  . Acute on chronic combined systolic and diastolic CHF (congestive heart failure) ([place]) 11/30/2017  . ESRD (end stage renal disease) on dialysis ([place])   . Iron deficiency anemia 11/22/2015  . AL amyloidosis ([place]) 09/25/2014  . Anemia of chronic disease 09/25/2014  . Essential hypertension, benign 11/06/2013   Past Medical History:  Diagnosis Date  . Amyloidosis ([place])   . Anemia of chronic disease   . Anxiety   . CAD (coronary artery disease)    a. cardiac cath on 11/25/17 with occlusion of the RCA which could have been his event but the RCA could not be engaged. The RCA filled distally from left to right collaterals. There were no severe stenoses in the left coronary system. Medical therapy recommended, started on plavix.  . Chronic combined systolic and diastolic CHF (congestive heart failure) ([place])    a. EF dropped to 35-40% with grade 2 DD by echo 11/2017.  [name] DJD (degenerative joint disease), lumbar   . ESRD (end stage renal disease) ([place])    ESRD- HEMO MWF- [facility]  . GERD (gastroesophageal reflux disease)   . Hyperlipidemia   . Hypertension   . Myocardial infarction ([place])   . Neuropathy   . Peripheral vascular disease ([place])   . Pneumonia   . Restless legs   . S/P TAVR (transcatheter aortic valve replacement) 10/21/2018   26 mm Edwards Sapien 3 Ultra transcatheter heart valve placed via percutaneous right transfemoral approach   .  Severe aortic stenosis    severe     Allergies as of 05/18/2019   No Known Allergies     Medication List    TAKE these medications   amiodarone 200 MG tablet Commonly known as: PACERONE Take 1 tablet (200 mg total) by mouth daily.   atorvastatin 80 MG tablet Commonly known as: LIPITOR Take 1 tablet (80 mg total) by mouth daily with supper.   carvedilol 3.125 MG tablet Commonly known as: COREG Take 1 tablet (3.125 mg total) by mouth 2 (two) times daily.   clopidogrel 75 MG tablet Commonly known as: PLAVIX Take 1 tablet (75 mg total) by mouth daily with supper.   LORazepam 1 MG tablet Commonly known as: ATIVAN Take 1 mg by mouth 2 (two) times daily as needed for anxiety.   methocarbamol 500 MG tablet Commonly known as: ROBAXIN Take 500 mg by mouth 2 (two) times daily as needed for mild pain.   midodrine 10 MG tablet Commonly known as: PROAMATINE Take 1 tablet (10 mg total) by mouth every Monday, Wednesday, and Friday with hemodialysis.   multivitamin Tabs tablet Take 1 tablet by mouth at bedtime. What changed: when to take this   oxyCODONE 5 MG immediate release tablet Commonly known as: Oxy IR/ROXICODONE Take 1 tablet (5 mg total) by mouth every 6 (six) hours as needed for moderate pain.   sucroferric oxyhydroxide 500 MG chewable tablet Commonly known as: VELPHORO Chew 2 tablets (1,000 mg total) by mouth 3 (three) times daily with meals. What changed: how much to take   temazepam 15 MG capsule Commonly known as: RESTORIL Take 15 mg by mouth at bedtime as needed for sleep.   Tylenol Dissolve Packs 500 MG Pack Generic drug: Acetaminophen Place 500-1,000 mg under the tongue every 6 (six) hours as needed (for pain).   Vancomycin 750-5 MG/150ML-% Soln Commonly known as: VANCOCIN Inject 150 mLs (750 mg total) into the vein every Monday, Wednesday,  and Friday with hemodialysis for 12 doses. Start taking on: May 20, 2019   vitamin B-12 1000 MCG  tablet Commonly known as: CYANOCOBALAMIN Take 1,000 mcg by mouth every evening.       Prescriptions given: None given but pt to continue Vancomycin 750mg  every M/W/F with dialysis x 10 doses.   Disposition: CIR  Patient's condition: is Good  Follow up: 1. Dr. Donzetta Matters on 05/29/2019 for staple removal.  If pt still in CIR, we can remove them there.  2. Infectious Disease on 06/25/2019 at [place], [facility] [phone] 05/18/2019  11:57 AM

## 2019-05-18 NOTE — Progress Notes (Signed)
Pt discharged to 4M08. RN in room to receive. All belongings transferred with patient. Patient called and updated his daughter on new room.  Clyde Canterbury, RN

## 2019-05-18 NOTE — H&P (Signed)
Physical Medicine and Rehabilitation Admission H&P       HPI: Jose Garrett is a 69 year old right-handed male with history of CAD status post TAVR procedure August 2020 maintained on Plavix as well as amiodarone, chronic combined systolic and diastolic congestive heart failure.  End-stage renal disease with hemodialysis Monday Wednesday and Friday [facility], hypertension, hyperlipidemia, quit smoking 8 years ago.  Patient did receive inpatient rehab services 07/28/2018 to 08/13/2018 for debility related to PAD status post left common femoral to below-knee popliteal artery bypass and discharged to home contact-guard assist mobility.  Presented 04/29/2019 for gangrenous changes of the right foot as well as recent right lower extremity bypass and no change with conservative care undergoing right transmetatarsal amputation 04/29/2019 per Dr. Donzetta Matters.  Postoperatively with plantar flap becoming necrotic returning back to the OR for revision of right transmetatarsal amputation removal of necrotic tissue 05/04/2018 unfortunately with progressive gangrenous necrotic changes and ultimately underwent right BKA 05/06/2018 per Dr Trula Slade..  Hemodialysis ongoing as per renal services.  Patient doing well until 05/11/2019 isolated fever of 103 blood cultures grew Enterococcus placed on vancomycin x4 weeks through 06/10/2019.  Infectious disease consulted with TEE to evaluate TAVR showed no vegetation and no vavular AI.[name]  Acute on chronic anemia 7.7 and monitored.  Subcutaneous heparin for DVT prophylaxis.  Therapy evaluations completed and patient was admitted for a comprehensive rehab program.   Review of Systems  Constitutional: Positive for fever and malaise/fatigue. Negative for chills.  HENT: Negative for hearing loss.   Eyes: Negative for blurred vision and double vision.  Respiratory: Positive for shortness of breath. Negative for cough.   Cardiovascular: Positive for palpitations and leg swelling. Negative for chest  pain.  Gastrointestinal: Positive for constipation. Negative for heartburn, nausea and vomiting.       GERD  Genitourinary: Negative for flank pain and hematuria.  Musculoskeletal: Positive for joint pain and myalgias.  Skin: Negative for rash.  Neurological:       Restless legs  All other systems reviewed and are negative.       Past Medical History:  Diagnosis Date  . Amyloidosis ([place])    . Anemia of chronic disease    . Anxiety    . CAD (coronary artery disease)      a. cardiac cath on 11/25/17 with occlusion of the RCA which could have been his event but the RCA could not be engaged. The RCA filled distally from left to right collaterals. There were no severe stenoses in the left coronary system. Medical therapy recommended, started on plavix.  . Chronic combined systolic and diastolic CHF (congestive heart failure) ([place])      a. EF dropped to 35-40% with grade 2 DD by echo 11/2017.  [name] DJD (degenerative joint disease), lumbar    . ESRD (end stage renal disease) ([place])      ESRD- HEMO MWF- [facility]  . GERD (gastroesophageal reflux disease)    . Hyperlipidemia    . Hypertension    . Myocardial infarction ([place])    . Neuropathy    . Peripheral vascular disease ([place])    . Pneumonia    . Restless legs    . S/P TAVR (transcatheter aortic valve replacement) 10/21/2018    26 mm Edwards Sapien 3 Ultra transcatheter heart valve placed via percutaneous right transfemoral approach   . Severe aortic stenosis      severe         Past Surgical History:  Procedure Laterality Date  .  ABDOMINAL AORTOGRAM W/LOWER EXTREMITY Bilateral 07/16/2018    Procedure: ABDOMINAL AORTOGRAM W/LOWER EXTREMITY;  Surgeon: Wellington Hampshire, MD;  Location: [place] [facility];  Service: Cardiovascular;  Laterality: Bilateral;  . ABDOMINAL AORTOGRAM W/LOWER EXTREMITY N/A 04/09/2019    Procedure: ABDOMINAL AORTOGRAM W/LOWER EXTREMITY - Right Lower;  Surgeon: Waynetta Sandy, MD;  Location: [place] [facility]  [facility];  Service: Cardiovascular;  Laterality: N/A;  . AMPUTATION Left 08/19/2018    Procedure: AMPUTATION LEFT TOES SECOND AND THIRD;  Surgeon: Waynetta Sandy, MD;  Location: [place];  Service: Vascular;  Laterality: Left;  . AMPUTATION Right 05/07/2019    Procedure: AMPUTATION BELOW KNEE;  Surgeon: Serafina Mitchell, MD;  Location: [facility] OR;  Service: Vascular;  Laterality: Right;  . AV FISTULA PLACEMENT Right 12/27/2015    Procedure: Right Arm ARTERIOVENOUS (AV) FISTULA CREATION;  Surgeon: Angelia Mould, MD;  Location: [place];  Service: Vascular;  Laterality: Right;  . CATARACT EXTRACTION W/ INTRAOCULAR LENS  IMPLANT, BILATERAL      . COLONOSCOPY      . ERCP W/ SPHICTEROTOMY      . FEMORAL-POPLITEAL BYPASS GRAFT Left 07/22/2018    Procedure: left FEMORAL TO BELOW KNEE POPLITEAL ARTERY BYPASS GREAFT using 66mm removable ring propaten gore graft;  Surgeon: Waynetta Sandy, MD;  Location: [place];  Service: Vascular;  Laterality: Left;  . FEMORAL-POPLITEAL BYPASS GRAFT Right 04/10/2019    Procedure: BYPASS GRAFT RIGHT FEMORAL-POPLITEAL ARTERY;  Surgeon: Rosetta Posner, MD;  Location: [place];  Service: Vascular;  Laterality: Right;  . I & D EXTREMITY Right 05/05/2019    Procedure: Revision of  RIGHT  FOOT Amputation;  Surgeon: Waynetta Sandy, MD;  Location: [place];  Service: Vascular;  Laterality: Right;  . INTRAOPERATIVE TRANSTHORACIC ECHOCARDIOGRAM N/A 10/21/2018    Procedure: Intraoperative Transthoracic Echocardiogram;  Surgeon: Sherren Mocha, MD;  Location: [place];  Service: Open Heart Surgery;  Laterality: N/A;  . IR GENERIC HISTORICAL   01/27/2016    IR FLUORO GUIDE CV LINE RIGHT 01/27/2016 Arne Cleveland, MD [facility]INTER[facility]  . IR GENERIC HISTORICAL   01/27/2016    IR US GUIDE VASC ACCESS RIGHT 01/27/2016 Arne Cleveland, MD [facility]  . LAPAROSCOPIC CHOLECYSTECTOMY   03/2011  . LEFT HEART CATH AND CORONARY ANGIOGRAPHY N/A 11/25/2017    Procedure: LEFT HEART CATH AND  CORONARY ANGIOGRAPHY;  Surgeon: Martinique, Peter M, MD;  Location: [place] [facility];  Service: Cardiovascular;  Laterality: N/A;  . LEFT HEART CATH AND CORONARY ANGIOGRAPHY N/A 07/24/2018    Procedure: LEFT HEART CATH AND CORONARY ANGIOGRAPHY;  Surgeon: Sherren Mocha, MD;  Location: [place] [facility];  Service: Cardiovascular;  Laterality: N/A;  . RENAL BIOPSY, PERCUTANEOUS Right 09/08/2014  . RIGHT/LEFT HEART CATH AND CORONARY ANGIOGRAPHY N/A 03/25/2018    Procedure: RIGHT/LEFT HEART CATH AND CORONARY ANGIOGRAPHY;  Surgeon: Martinique, Peter M, MD;  Location: [place] [facility];  Service: Cardiovascular;  Laterality: N/A;  . TOE AMPUTATION Left 08/19/2018    2nd & 3rd toes  . TONSILLECTOMY   ~ 1960  . TRANSCATHETER AORTIC VALVE REPLACEMENT, TRANSFEMORAL N/A 10/21/2018    Procedure: TRANSCATHETER AORTIC VALVE REPLACEMENT, TRANSFEMORAL;  Surgeon: Sherren Mocha, MD;  Location: [place];  Service: Open Heart Surgery;  Laterality: N/A;  . TRANSMETATARSAL AMPUTATION Right 04/29/2019    Procedure: RIGHT TRANSMETATARSAL AMPUTATION;  Surgeon: Waynetta Sandy, MD;  Location: [place];  Service: Vascular;  Laterality: Right;  . ULTRASOUND GUIDANCE FOR VASCULAR ACCESS  03/25/2018    Procedure: Ultrasound Guidance For Vascular Access;  Surgeon: Martinique, Peter M, MD;  Location: [place] [facility];  Service: Cardiovascular;;         Family History  Problem Relation Age of Onset  . Hypertension Mother    . Diabetes Mother    . Heart disease Mother    . Skin cancer Mother    . Hypertension Father    . Diabetes Father    . Diabetes Sister      Social History:  reports that he quit smoking about 8 years ago. His smoking use included cigarettes. He has a 80.00 pack-year smoking history. He has never used smokeless tobacco. He reports that he does not drink alcohol or use drugs. Allergies: No Known Allergies Medications Prior to Admission  Medication Sig Dispense Refill  . Acetaminophen (TYLENOL DISSOLVE  PACKS) 500 MG PACK Place 500-1,000 mg under the tongue every 6 (six) hours as needed (for pain).      [name] amiodarone (PACERONE) 200 MG tablet Take 1 tablet (200 mg total) by mouth daily. 90 tablet 2  . atorvastatin (LIPITOR) 80 MG tablet Take 1 tablet (80 mg total) by mouth daily with supper. 30 tablet 0  . carvedilol (COREG) 3.125 MG tablet Take 1 tablet (3.125 mg total) by mouth 2 (two) times daily. 180 tablet 3  . methocarbamol (ROBAXIN) 500 MG tablet Take 500 mg by mouth 2 (two) times daily as needed for mild pain.      . midodrine (PROAMATINE) 10 MG tablet Take 1 tablet (10 mg total) by mouth every Monday, Wednesday, and Friday with hemodialysis. 30 tablet 0  . multivitamin (RENA-VIT) TABS tablet Take 1 tablet by mouth at bedtime. (Patient taking differently: Take 1 tablet by mouth daily with breakfast. ) 30 tablet 0  . sucroferric oxyhydroxide (VELPHORO) 500 MG chewable tablet Chew 2 tablets (1,000 mg total) by mouth 3 (three) times daily with meals. (Patient taking differently: Chew 1,250 mg by mouth 3 (three) times daily with meals. ) 90 tablet 0  . temazepam (RESTORIL) 15 MG capsule Take 15 mg by mouth at bedtime as needed for sleep.      . vitamin B-12 (CYANOCOBALAMIN) 1000 MCG tablet Take 1,000 mcg by mouth every evening.       . [DISCONTINUED] oxyCODONE (OXY IR/ROXICODONE) 5 MG immediate release tablet Take 1 tablet (5 mg total) by mouth every 6 (six) hours as needed for moderate pain. 30 tablet 0  . clopidogrel (PLAVIX) 75 MG tablet Take 1 tablet (75 mg total) by mouth daily with supper. 90 tablet 2  . LORazepam (ATIVAN) 1 MG tablet Take 1 mg by mouth 2 (two) times daily as needed for anxiety.          Drug Regimen Review Drug regimen was reviewed and remains appropriate with no significant issues identified   Home: Home Living Family/patient expects to be discharged to:: Inpatient rehab Living Arrangements: Non-relatives/Friends Available Help at Discharge: Friend(s), Available 24  hours/day Type of Home: House Home Access: Stairs to enter, Ramped entrance [employer] of Steps: 1(6-8 inches) Entrance Stairs-Rails: Left Home Layout: One level Bathroom Shower/Tub: Chiropodist: Standard Home Equipment: Kasandra Knudsen - single point, Environmental consultant - 2 wheels, Bedside commode, Wheelchair - manual Additional Comments: when asked today if patient lives with anyone states he lives alone but that his daughter and ex-wife would help him after discharge   Functional History: Prior Function Level of Independence: Independent with assistive device(s) Comments: pt  reports he was living alone with one step.  Original eval said living at friends home.   Functional Status:  Mobility: Bed Mobility Overal bed mobility: Needs Assistance Bed Mobility: Supine to Sit Rolling: Mod assist Supine to sit: Min assist Sit to supine: Supervision General bed mobility comments: min A for safety with trunk stability Transfers Overall transfer level: Needs assistance Equipment used: Rolling walker (2 wheeled) Transfers: Sit to/from Stand Sit to Stand: Min assist, Mod assist, +2 physical assistance, +2 safety/equipment, From elevated surface Stand pivot transfers: Mod assist, +2 safety/equipment General transfer comment: assist to power up and to steady; verbal cues for body mechanics, patient shows improvement in weight bearing through L LE Ambulation/Gait Ambulation/Gait assistance: Min assist, +2 safety/equipment Gait Distance (Feet): 10 Feet Assistive device: Rolling walker (2 wheeled) Gait Pattern/deviations: Step-to pattern General Gait Details: cues for sequencing, upright posture, and proximity to RW Gait velocity: decreased Gait velocity interpretation: <1.31 ft/sec, indicative of household ambulator   ADL: ADL Overall ADL's : Needs assistance/impaired Eating/Feeding: Independent Grooming: Set up, Sitting Grooming Details (indicate cue type and reason):  intermittent min guard initially progressing to supervision for balance Upper Body Bathing: Set up, Sitting Lower Body Bathing: Moderate assistance, Maximal assistance, Sitting/lateral leans, Sit to/from stand Lower Body Bathing Details (indicate cue type and reason): due to difficulty standing s/p R BKA Upper Body Dressing : Set up, Sitting Lower Body Dressing: Maximal assistance, Sitting/lateral leans Lower Body Dressing Details (indicate cue type and reason): don sock to L foot Toilet Transfer: Moderate assistance, +2 for physical assistance, +2 for safety/equipment, BSC, RW, Cueing for safety, Cueing for sequencing, Minimal assistance Toilet Transfer Details (indicate cue type and reason): simulated with mobility and transfer to recliner, improved ability to maintain weight bearing through L LE and hop to perform transfer Functional mobility during ADLs: Minimal assistance, Moderate assistance, +2 for safety/equipment, Rolling walker, Cueing for sequencing, Cueing for safety General ADL Comments: pt limted by decreased activity tolerance, pain in R LE, impaired balance with maintaining weight off R LE (darco shoe not present), and decreased awareness    Cognition: Cognition Overall Cognitive Status: No family/caregiver present to determine baseline cognitive functioning Orientation Level: Oriented X4 Cognition Arousal/Alertness: Awake/alert Behavior During Therapy: WFL for tasks assessed/performed Overall Cognitive Status: No family/caregiver present to determine baseline cognitive functioning Area of Impairment: Safety/judgement, Memory Memory: Decreased short-term memory Safety/Judgement: Decreased awareness of safety Awareness: Emergent Problem Solving: Slow processing, Requires verbal cues, Difficulty sequencing General Comments: patient able to follow directions during assessment however his story regarding home set up vs original eval vary   Physical Exam: Blood pressure (!)  126/43, pulse 62, temperature 97.7 F (36.5 C), temperature source Oral, resp. rate 18, height 5\' 10"  (1.778 m), weight 67.4 kg, SpO2 98 %. Physical Exam  Neurological:  Patient is alert.  Complaints of feeling tired.  Oriented x3 and follows commands.  Skin:  Right BKA  site is dressed appropriately tender  Left 2nd and 3rd toe amputation sites well healed  General: No acute distress Mood and affect are appropriate Heart: Regular rate and rhythm no rubs murmurs or extra sounds Lungs: Clear to auscultation, breathing unlabored, no rales or wheezes Abdomen: Positive bowel sounds, soft nontender to palpation, nondistended Extremities: No clubbing, cyanosis, or edema Skin: No evidence of breakdown, no evidence of rash Neurologic: Cranial nerves II through XII intact, motor strength is 5/5 in bilateral deltoid, bicep, tricep, grip,4/5 Left hip flexor, knee extensors, ankle dorsiflexor and plantar flexor, 4/5 Right hip  flexor add and abd Sensory exam normal sensation to light touch in bilateral upper and reduced light touch left lower extremity  Musculoskeletal: Right BKA, Left 2nd and 3rd toe amp  Lab Results Last 48 Hours        Results for orders placed or performed during the hospital encounter of 04/29/19 (from the past 48 hour(s))  Renal function panel     Status: Abnormal    Collection Time: 05/11/19  8:18 AM  Result Value Ref Range    Sodium 135 135 - 145 mmol/L    Potassium 4.2 3.5 - 5.1 mmol/L    Chloride 94 (L) 98 - 111 mmol/L    CO2 22 22 - 32 mmol/L    Glucose, Bld 87 70 - 99 mg/dL      Comment: Glucose reference range applies only to samples taken after fasting for at least 8 hours.    BUN 56 (H) 8 - 23 mg/dL    Creatinine, Ser 8.45 (H) 0.61 - 1.24 mg/dL    Calcium 8.3 (L) 8.9 - 10.3 mg/dL    Phosphorus 7.1 (H) 2.5 - 4.6 mg/dL    Albumin 2.3 (L) 3.5 - 5.0 g/dL    GFR calc non Af Amer 6 (L) >60 mL/min    GFR calc Af Amer 7 (L) >60 mL/min    Anion gap 19 (H) 5 - 15       Comment: Performed at [facility] [address] [place], [place] [address]  CBC     Status: Abnormal    Collection Time: 05/11/19  8:18 AM  Result Value Ref Range    WBC 10.8 (H) 4.0 - 10.5 K/uL    RBC 2.47 (L) 4.22 - 5.81 MIL/uL    Hemoglobin 8.1 (L) 13.0 - 17.0 g/dL    HCT 25.6 (L) 39.0 - 52.0 %    MCV 103.6 (H) 80.0 - 100.0 fL    MCH 32.8 26.0 - 34.0 pg    MCHC 31.6 30.0 - 36.0 g/dL    RDW 14.0 11.5 - 15.5 %    Platelets 153 150 - 400 K/uL    nRBC 0.3 (H) 0.0 - 0.2 %      Comment: Performed at [facility], [place] [address] [place], [place] [address]  Culture, blood (Routine X 2) w Reflex to ID Panel     Status: None (Preliminary result)    Collection Time: 05/11/19  2:20 PM    Specimen: BLOOD  Result Value Ref Range    Specimen Description BLOOD LEFT ANTECUBITAL      Special Requests          BOTTLES DRAWN AEROBIC AND ANAEROBIC Blood Culture adequate volume    Culture  Setup Time          GRAM POSITIVE COCCI IN PAIRS IN CHAINS IN BOTH AEROBIC AND ANAEROBIC BOTTLES CRITICAL RESULT CALLED TO, READ BACK BY AND VERIFIED WITH: Salli Real [id number] 05/12/2019 Mena Goes Performed at [facility], [place] [address] [place], [place] [address]      Culture GRAM POSITIVE COCCI      Report Status PENDING    Blood Culture ID Panel (Reflexed)     Status: Abnormal    Collection Time: 05/11/19  2:20 PM  Result Value Ref Range    Enterococcus species DETECTED (A) NOT DETECTED      Comment: CRITICAL RESULT CALLED TO, READ BACK BY AND VERIFIED WITH: G. ABBOTT,PHARMD [id number] 05/12/2019 T. Rosalia Hammers  Vancomycin resistance NOT DETECTED NOT DETECTED    Listeria monocytogenes NOT DETECTED NOT DETECTED    Staphylococcus species NOT DETECTED NOT DETECTED    Staphylococcus aureus (BCID) NOT DETECTED NOT DETECTED    Streptococcus species NOT DETECTED NOT DETECTED    Streptococcus agalactiae NOT DETECTED NOT DETECTED    Streptococcus pneumoniae NOT DETECTED NOT DETECTED     Streptococcus pyogenes NOT DETECTED NOT DETECTED    Acinetobacter baumannii NOT DETECTED NOT DETECTED    Enterobacteriaceae species NOT DETECTED NOT DETECTED    Enterobacter cloacae complex NOT DETECTED NOT DETECTED    Escherichia coli NOT DETECTED NOT DETECTED    Klebsiella oxytoca NOT DETECTED NOT DETECTED    Klebsiella pneumoniae NOT DETECTED NOT DETECTED    Proteus species NOT DETECTED NOT DETECTED    Serratia marcescens NOT DETECTED NOT DETECTED    Haemophilus influenzae NOT DETECTED NOT DETECTED    Neisseria meningitidis NOT DETECTED NOT DETECTED    Pseudomonas aeruginosa NOT DETECTED NOT DETECTED    Candida albicans NOT DETECTED NOT DETECTED    Candida glabrata NOT DETECTED NOT DETECTED    Candida krusei NOT DETECTED NOT DETECTED    Candida parapsilosis NOT DETECTED NOT DETECTED    Candida tropicalis NOT DETECTED NOT DETECTED      Comment: Performed at [facility] [address] [place], [place] [address]  Culture, blood (Routine X 2) w Reflex to ID Panel     Status: None (Preliminary result)    Collection Time: 05/11/19  2:28 PM    Specimen: BLOOD  Result Value Ref Range    Specimen Description BLOOD LEFT ANTECUBITAL      Special Requests          BOTTLES DRAWN AEROBIC AND ANAEROBIC Blood Culture results may not be optimal due to an inadequate volume of blood received in culture bottles    Culture  Setup Time          GRAM POSITIVE COCCI IN PAIRS IN CHAINS IN BOTH AEROBIC AND ANAEROBIC BOTTLES CRITICAL RESULT CALLED TO, READ BACK BY AND VERIFIED WITH: Salli Real [id number] 05/12/2019 Mena Goes Performed at [facility], [place] [address] [place], [place] [address]      Culture GRAM POSITIVE COCCI      Report Status PENDING    CBC with Differential/Platelet     Status: Abnormal    Collection Time: 05/12/19  2:56 AM  Result Value Ref Range    WBC 11.3 (H) 4.0 - 10.5 K/uL    RBC 2.38 (L) 4.22 - 5.81 MIL/uL    Hemoglobin 7.6 (L) 13.0 - 17.0 g/dL    HCT 25.2 (L)  39.0 - 52.0 %    MCV 105.9 (H) 80.0 - 100.0 fL    MCH 31.9 26.0 - 34.0 pg    MCHC 30.2 30.0 - 36.0 g/dL    RDW 14.3 11.5 - 15.5 %    Platelets 130 (L) 150 - 400 K/uL    nRBC 0.2 0.0 - 0.2 %    Neutrophils Relative % 83 %    Neutro Abs 9.3 (H) 1.7 - 7.7 K/uL    Lymphocytes Relative 5 %    Lymphs Abs 0.6 (L) 0.7 - 4.0 K/uL    Monocytes Relative 6 %    Monocytes Absolute 0.7 0.1 - 1.0 K/uL    Eosinophils Relative 1 %    Eosinophils Absolute 0.1 0.0 - 0.5 K/uL    Basophils Relative 0 %    Basophils  Absolute 0.1 0.0 - 0.1 K/uL    Immature Granulocytes 5 %    Abs Immature Granulocytes 0.52 (H) 0.00 - 0.07 K/uL      Comment: Performed at [facility], [place] [address], [place], [place] [address]       Imaging Results (Last 48 hours)  X-ray chest PA or AP   Result Date: 05/11/2019 CLINICAL DATA:  Fever. EXAM: CHEST  1 VIEW COMPARISON:  10/21/2018 in CT chest 09/30/2018. FINDINGS: No acute findings. Aortic valve replacement. Thoracic aorta is calcified. Lungs are somewhat hyperinflated but clear. No pleural fluid. IMPRESSION: No acute findings. Electronically Signed   By: Lorin Picket M.D.   On: 05/11/2019 14:28             Medical Problem List and Plan: 1.  Decreased functional mobility secondary to right BKA 05/07/2019 after failed transmetatarsal amputation and recent right lower extremity bypass             -patient may  Shower with waterproof wrap on R BKA             -ELOS/Goals: 7 to 10 days mod I wheelchair level 2.  Antithrombotics: -DVT/anticoagulation: Subcutaneous heparin             -antiplatelet therapy: Plavix 75 mg daily 3. Pain Management: Robaxin oxycodone as needed 4. Mood: Ativan as needed             -antipsychotic agents: N/A 5. Neuropsych: This patient is capable of making decisions on his own behalf. 6. Skin/Wound Care: Routine skin checks 7. Fluids/Electrolytes/Nutrition: Routine in and outs with follow-up chemistries 8.  ID/enterococcal bacteremia.   Follow-up infectious disease.  Check TEE.  Continue vancomycin x 4 weeks to be completed 06/10/2019 9.  End-stage renal disease.  Continue hemodialysis as directed 10.  CAD/TAVR August 2020.  Continue Plavix as well as amiodarone 200 mg daily as directed. 11.  Chronic combined systolic and diastolic congestive heart failure.  Monitor for any signs of fluid overload.  Continue Coreg 3.125 mg twice daily 12.  Acute on chronic anemia.  Continue Aranesp.  Follow-up CBC 13.  Orthostasis.  ProAmatine 10 mg Monday Wednesday Friday with hemodialysis.  Monitor with increased mobility 14.  Hyperlipidemia.  Lipitor    Lavon Paganini Angiulli, PA-C 05/13/2019  "I have personally performed a face to face diagnostic evaluation of this patient.  Additionally, I have reviewed and concur with the physician assistant's documentation above." Charlett Blake M.D. [facility] FAAPM&R (Neuromuscular Med) Diplomate Am Board of Electrodiagnostic Med Fellow Am Board of Interventional Pain

## 2019-05-18 NOTE — Progress Notes (Signed)
PT Cancellation Note  Patient Details Name: HARM JOU MRN: [id number] DOB: 01-13-1951   Cancelled Treatment:    Reason Eval/Treat Not Completed: Patient at procedure or test/unavailable Patient in HD. PT will continue to follow acutely.   Earney Navy, PTA Acute Rehabilitation Services Pager: [phone] Office: [phone]   05/18/2019, 9:13 AM

## 2019-05-18 NOTE — Progress Notes (Signed)
Inpatient Rehabilitation Admissions Coordinator  I have received insurance approval from appeal to admit patient to CIR today. I met with patient in hemodialysis and he in agreement to admit today. I will contact Dr. Donzetta Matters for discharge. Steffanie Dunn, RN CM, and Caren Griffins, SW made aware as well as his RN for today. Freda Munro in  hemodialysis made aware. I will make the arrangement to admit today.  Danne Baxter, RN, MSN Rehab Admissions Coordinator [phone] 05/18/2019 11:07 AM

## 2019-05-18 NOTE — Progress Notes (Signed)
Physical Therapy Treatment Patient Details Name: Jose Garrett MRN: [id number] DOB: Jul 13, 1950 Today's Date: 05/18/2019    History of Present Illness Pt is a 69 y.o. male with recent hx of R LE bypass (04/10/19), now admitted 04/29/19 with progressive gangrenous changes of toes and dorsum of foot. S/p R transmet amputation 2/17. S/p R BKA 2/25. PMH includes aortic stenosis, HTN, HLD, CHF, CAD, PD, anxiety, ESRD (HD MWF). 2-25 pt had BKA.    PT Comments    Patient seen for mobility progression. Pt feeling fatigued after HD but agreeable to participate in therapy. This session focused no functional transfer/gait training. Current plan remains appropriate.    Follow Up Recommendations  CIR;Supervision/Assistance - 24 hour     Equipment Recommendations  None recommended by PT    Recommendations for Other Services       Precautions / Restrictions Precautions Precautions: Fall Required Braces or Orthoses: Knee Immobilizer - Right Knee Immobilizer - Right: On at all times Restrictions Weight Bearing Restrictions: Yes RLE Weight Bearing: Non weight bearing    Mobility  Bed Mobility               General bed mobility comments: pt sittiing EOB upon arrival  Transfers Overall transfer level: Needs assistance Equipment used: Rolling walker (2 wheeled) Transfers: Sit to/from Stand Sit to Stand: Mod assist;Max assist;+2 physical assistance Stand pivot transfers: Mod assist;+2 physical assistance       General transfer comment: pt with carry over of hand placement; cues for positioning of L LE/foot prior to stands; assist to power up into standing and for balance with transition of hand placement from EOB and recliner  Ambulation/Gait Ambulation/Gait assistance: Min assist;+2 safety/equipment Gait Distance (Feet): 10 Feet Assistive device: Rolling walker (2 wheeled) Gait Pattern/deviations: Step-to pattern Gait velocity: decreased   General Gait Details: cues for upright  posture; assist to steady; good foot clearance   Stairs             Wheelchair Mobility    Modified Rankin (Stroke Patients Only)       Balance Overall balance assessment: Needs assistance Sitting-balance support: No upper extremity supported;Feet supported Sitting balance-Leahy Scale: Fair Sitting balance - Comments: posterior lean during levating R UE   Standing balance support: Bilateral upper extremity supported;During functional activity Standing balance-Leahy Scale: Poor                              Cognition Arousal/Alertness: Awake/alert Behavior During Therapy: WFL for tasks assessed/performed Overall Cognitive Status: Within Functional Limits for tasks assessed                                        Exercises Amputee Exercises Knee Extension: AROM;Right;20 reps;Seated    General Comments        Pertinent Vitals/Pain Pain Assessment: Faces Faces Pain Scale: Hurts little more Pain Location: R residual limb  Pain Descriptors / Indicators: Guarding;Grimacing Pain Intervention(s): Limited activity within patient's tolerance;Monitored during session;Repositioned    Home Living                      Prior Function            PT Goals (current goals can now be found in the care plan section) Acute Rehab PT Goals Patient Stated Goal: Pt wanting to go to CIR  for therapy and then hoping to go home Progress towards PT goals: Progressing toward goals    Frequency    Min 3X/week      PT Plan Current plan remains appropriate    Co-evaluation PT/OT/SLP Co-Evaluation/Treatment: Yes Reason for Co-Treatment: For patient/therapist safety;To address functional/ADL transfers   OT goals addressed during session: ADL's and self-care;Proper use of Adaptive equipment and DME      AM-PAC PT "6 Clicks" Mobility   Outcome Measure  Help needed turning from your back to your side while in a flat bed without using  bedrails?: A Little Help needed moving from lying on your back to sitting on the side of a flat bed without using bedrails?: A Little Help needed moving to and from a bed to a chair (including a wheelchair)?: A Little Help needed standing up from a chair using your arms (e.g., wheelchair or bedside chair)?: A Lot Help needed to walk in hospital room?: A Lot Help needed climbing 3-5 steps with a railing? : Total 6 Click Score: 14    End of Session Equipment Utilized During Treatment: Gait belt Activity Tolerance: Patient tolerated treatment well Patient left: with call bell/phone within reach;in chair Nurse Communication: Mobility status PT Visit Diagnosis: Other abnormalities of gait and mobility (R26.89);Muscle weakness (generalized) (M62.81);Difficulty in walking, not elsewhere classified (R26.2);Pain Pain - Right/Left: Right Pain - part of body: Leg     Time: 1355-1424 PT Time Calculation (min) (ACUTE ONLY): 29 min  Charges:  $Gait Training: 8-22 mins                     Earney Navy, PTA Acute Rehabilitation Services Pager: [phone] Office: [phone]     Darliss Cheney 05/18/2019, 4:46 PM

## 2019-05-18 NOTE — PMR Pre-admission (Signed)
PMR Admission Coordinator Pre-Admission Assessment  Patient: Jose Garrett is an 69 y.o., male MRN: [id number] DOB: 1950/12/04 Height: (pt refused bath. ) Weight: 68 kg  Insurance Information HMO:     PPO:      PCP:      IPA:      80/20:      OTHER:  PRIMARY: [place] [employer]      Policy#: [id number]      Subscriber: pt CM Name: [facility]      Phone#: [phone] general number     Fax#: [phone] Pre-Cert#: [id number] approved for 7 days   Initially denied but overturned on appeal   Employer:  Benefits:  Phone #: [phone]     Name: 3/5 Eff. Date: 04/13/2019     Deduct: none      Out of Pocket Max: $3600      Life Max: none CIR: $295 co pay per day days 1 until 5      SNF: no copay days 1 until 20; $184 co pay per day days 21 until 40; no copay days 41 until 100 Outpatient: $30 per visit      Co-Pay: visits per medical neccesity Home Health: 100%      Co-Pay: visits per medical neccesity DME: 9-%     Co-Pay: 20% Providers: in network  SECONDARY: none      Medicaid Application Date:       Case Manager:  Disability Application Date:       Case Worker:   The "Data Collection Information Summary" for patients in Inpatient Rehabilitation Facilities with attached "Privacy Act [place] Records" was provided and verbally reviewed with: Patient  Emergency Contact Information Contact Information    Name Relation Home Work Nikolski Daughter   [phone]   Jeanene Erb [phone]  [phone]      Current Medical History  Patient Admitting Diagnosis: BKA History of Present Illness:  69 year old right-handed male with history of CAD status post TAVR procedure August 2020 maintained on Plavix as well as amiodarone, chronic combined systolic and diastolic congestive heart failure.  End-stage renal disease with hemodialysis Monday Wednesday and Friday [facility], hypertension, hyperlipidemia, quit smoking 8 years ago.  Patient did receive  inpatient rehab services 07/28/2018 to 08/13/2018 for debility related to PAD status post left common femoral to below-knee popliteal artery bypass and discharged to home contact-guard assist mobility.  Presented 04/29/2019 for gangrenous changes of the right foot as well as recent right lower extremity bypass and no change with conservative care undergoing right transmetatarsal amputation 04/29/2019 per Dr. Donzetta Matters.  Postoperatively with plantar flap becoming necrotic returning back to the OR for revision of right transmetatarsal amputation removal of necrotic tissue 05/04/2018 unfortunately with progressive gangrenous necrotic changes and ultimately underwent right BKA 05/06/2018 per Dr Trula Slade..  Hemodialysis ongoing as per renal services.  Patient doing well until 05/11/2019 isolated fever of 103 blood cultures grew Enterococcus placed on vancomycin x4 weeks through 06/10/2019.  Infectious disease consulted with TEE to evaluate TAVR showed no vegetation and no valvular AI.[name]  Acute on chronic anemia 7.7 and monitored.  Subcutaneous heparin for DVT prophylaxis.    Patient's medical record from [facility]  has been reviewed by the rehabilitation admission coordinator and physician.  Past Medical History  Past Medical History:  Diagnosis Date  . Amyloidosis ([place])   . Anemia of chronic disease   . Anxiety   . CAD (coronary artery disease)    a. cardiac  cath on 11/25/17 with occlusion of the RCA which could have been his event but the RCA could not be engaged. The RCA filled distally from left to right collaterals. There were no severe stenoses in the left coronary system. Medical therapy recommended, started on plavix.  . Chronic combined systolic and diastolic CHF (congestive heart failure) ([place])    a. EF dropped to 35-40% with grade 2 DD by echo 11/2017.  [name] DJD (degenerative joint disease), lumbar   . ESRD (end stage renal disease) ([place])    ESRD- HEMO MWF- [facility]  . GERD (gastroesophageal reflux  disease)   . Hyperlipidemia   . Hypertension   . Myocardial infarction ([place])   . Neuropathy   . Peripheral vascular disease ([place])   . Pneumonia   . Restless legs   . S/P TAVR (transcatheter aortic valve replacement) 10/21/2018   26 mm Edwards Sapien 3 Ultra transcatheter heart valve placed via percutaneous right transfemoral approach   . Severe aortic stenosis    severe    Family History   family history includes Diabetes in his father, mother, and sister; Heart disease in his mother; Hypertension in his father and mother; Skin cancer in his mother.  Prior Rehab/Hospitalizations Has the patient had prior rehab or hospitalizations prior to admission? Yes  Has the patient had major surgery during 100 days prior to admission? Yes   Current Medications  Current Facility-Administered Medications:  .  0.9 %  sodium chloride infusion, 250 mL, Intravenous, PRN, Fay Records, MD .  0.9 %  sodium chloride infusion, , Intravenous, Continuous, Fay Records, MD, Stopped at 05/14/19 2117 .  acetaminophen (TYLENOL) tablet 325-650 mg, 325-650 mg, Oral, Q4H PRN, 650 mg at 05/11/19 1302 **OR** acetaminophen (TYLENOL) suppository 325-650 mg, 325-650 mg, Rectal, Q4H PRN, Fay Records, MD .  alum & mag hydroxide-simeth (MAALOX/MYLANTA) 200-200-20 MG/5ML suspension 15-30 mL, 15-30 mL, Oral, Q2H PRN, Fay Records, MD .  amiodarone (PACERONE) tablet 200 mg, 200 mg, Oral, Daily, Dorris Carnes V, MD, 200 mg at 05/17/19 0940 .  atorvastatin (LIPITOR) tablet 80 mg, 80 mg, Oral, Q supper, Fay Records, MD, 80 mg at 05/17/19 1718 .  bisacodyl (DULCOLAX) EC tablet 5 mg, 5 mg, Oral, Daily PRN, Fay Records, MD, 5 mg at 05/09/19 2215 .  calcitRIOL (ROCALTROL) capsule 1.5 mcg, 1.5 mcg, Oral, Q M,W,F-HD, Fay Records, MD, 1.5 mcg at 05/15/19 1217 .  carvedilol (COREG) tablet 3.125 mg, 3.125 mg, Oral, BID, Fay Records, MD, 3.125 mg at 05/17/19 2126 .  Chlorhexidine Gluconate Cloth 2 % PADS 6 each, 6 each,  Topical, Q0600, Janalee Dane, PA-C, 6 each at 05/18/19 0604 .  clopidogrel (PLAVIX) tablet 75 mg, 75 mg, Oral, Q supper, Fay Records, MD, 75 mg at 05/17/19 1718 .  Darbepoetin Alfa (ARANESP) injection 100 mcg, 100 mcg, Intravenous, Q Fri-HD, Fay Records, MD, 100 mcg at 05/15/19 1218 .  docusate sodium (COLACE) capsule 100 mg, 100 mg, Oral, Daily, Fay Records, MD, 100 mg at 05/17/19 0939 .  guaiFENesin-dextromethorphan (ROBITUSSIN DM) 100-10 MG/5ML syrup 15 mL, 15 mL, Oral, Q4H PRN, Fay Records, MD .  heparin injection 5,000 Units, 5,000 Units, Subcutaneous, Q8H, Fay Records, MD, 5,000 Units at 05/18/19 0604 .  hydrALAZINE (APRESOLINE) injection 5 mg, 5 mg, Intravenous, Q20 Min PRN, Dorris Carnes V, MD .  HYDROmorphone (DILAUDID) injection 0.5-1 mg, 0.5-1 mg, Intravenous, Q2H PRN, Fay Records, MD, 1 mg at  05/06/19 0954 .  labetalol (NORMODYNE) injection 10 mg, 10 mg, Intravenous, Q10 min PRN, Fay Records, MD .  LORazepam (ATIVAN) tablet 1 mg, 1 mg, Oral, BID PRN, Fay Records, MD, 1 mg at 05/07/19 0951 .  magnesium sulfate IVPB 2 g 50 mL, 2 g, Intravenous, Daily PRN, Fay Records, MD .  methocarbamol (ROBAXIN) tablet 500 mg, 500 mg, Oral, BID PRN, Fay Records, MD, 500 mg at 05/07/19 1054 .  metoprolol tartrate (LOPRESSOR) injection 2-5 mg, 2-5 mg, Intravenous, Q2H PRN, Fay Records, MD .  midodrine (PROAMATINE) tablet 10 mg, 10 mg, Oral, Q M,W,F-HD, Fay Records, MD, 10 mg at 05/11/19 1515 .  multivitamin (RENA-VIT) tablet 1 tablet, 1 tablet, Oral, QHS, Fay Records, MD, 1 tablet at 05/17/19 2127 .  ondansetron (ZOFRAN) injection 4 mg, 4 mg, Intravenous, Q6H PRN, Fay Records, MD, 4 mg at 05/17/19 1323 .  oxyCODONE (Oxy IR/ROXICODONE) immediate release tablet 5-10 mg, 5-10 mg, Oral, Q4H PRN, Fay Records, MD, 5 mg at 05/16/19 2158 .  pantoprazole (PROTONIX) EC tablet 40 mg, 40 mg, Oral, Daily, Fay Records, MD, 40 mg at 05/17/19 0939 .  phenol (CHLORASEPTIC) mouth spray  1 spray, 1 spray, Mouth/Throat, PRN, Fay Records, MD .  prochlorperazine (COMPAZINE) injection 10 mg, 10 mg, Intravenous, Q6H PRN, Fay Records, MD .  senna-docusate (Senokot-S) tablet 1 tablet, 1 tablet, Oral, QHS PRN, Fay Records, MD .  sodium chloride flush (NS) 0.9 % injection 3 mL, 3 mL, Intravenous, Q12H, Fay Records, MD, 3 mL at 05/17/19 2129 .  sodium chloride flush (NS) 0.9 % injection 3 mL, 3 mL, Intravenous, PRN, Fay Records, MD .  sucroferric oxyhydroxide [facility]) chewable tablet 1,500 mg, 1,500 mg, Oral, TID WC, Fay Records, MD, 1,500 mg at 05/16/19 0929 .  temazepam (RESTORIL) capsule 15 mg, 15 mg, Oral, QHS PRN, Fay Records, MD, 15 mg at 05/17/19 2127 .  vancomycin (VANCOCIN) IVPB 500 mg/100 ml premix, 500 mg, Intravenous, Once, Kris Mouton, [facility] .  [START ON 05/20/2019] vancomycin (VANCOCIN) IVPB 750 mg/150 ml premix, 750 mg, Intravenous, Q M,W,F-HD, Kris Mouton, RPH .  vitamin B-12 (CYANOCOBALAMIN) tablet 1,000 mcg, 1,000 mcg, Oral, QPM, Fay Records, MD, 1,000 mcg at 05/17/19 1718  Patients Current Diet:  Diet Order            Diet renal with fluid restriction Fluid restriction: 1200 mL Fluid; Room service appropriate? Yes; Fluid consistency: Thin  Diet effective now              Precautions / Restrictions Precautions Precautions: Fall Other Brace: darco shoe R  Restrictions Weight Bearing Restrictions: Yes RLE Weight Bearing: Non weight bearing RLE Partial Weight Bearing Percentage or Pounds: wieght bearing through heel with darco shoe Other Position/Activity Restrictions: (shoe not present during eval, maintained NWB)   Has the patient had 2 or more falls or a fall with injury in the past year? No  Prior Activity Level Limited Community (1-2x/wk): Mod I ; not driving; lived alone  Prior Functional Level Self Care: Did the patient need help bathing, dressing, using the toilet or eating? Independent  Indoor Mobility: Did the patient need  assistance with walking from room to room (with or without device)? Independent  Stairs: Did the patient need assistance with internal or external stairs (with or without device)? Independent  Functional Cognition: Did the patient need help planning regular tasks such as  shopping or remembering to take medications? [place] / Equipment Home Assistive Devices/Equipment: Chana Bode (specify type), Wheelchair Home Equipment: Cane - single point, Environmental consultant - 2 wheels, Bedside commode, Wheelchair - manual  Prior Device Use: Indicate devices/aids used by the patient prior to current illness, exacerbation or injury? Walker  Current Functional Level Cognition  Overall Cognitive Status: Within Functional Limits for tasks assessed Orientation Level: Oriented X4 Safety/Judgement: Decreased awareness of safety General Comments: wfls for OT session 3/3    Extremity Assessment (includes Sensation/Coordination)  Upper Extremity Assessment: Generalized weakness(LUE sore after HD)  Lower Extremity Assessment: Defer to PT evaluation RLE Deficits / Details: pts right leg in KI - pt complaining about not being able to keep it straight.  pt wtih 20 degrees knee flexion RLE: Unable to fully assess due to pain RLE Sensation: decreased light touch LLE Deficits / Details: left leg weak - pt 3-/5 quad.  Pt reports right leg was his strong leg LLE Sensation: decreased light touch    ADLs  Overall ADL's : Needs assistance/impaired Eating/Feeding: Independent Grooming: Set up, Sitting Grooming Details (indicate cue type and reason): intermittent min guard initially progressing to supervision for balance Upper Body Bathing: Set up, Sitting Lower Body Bathing: Moderate assistance, Maximal assistance, Sitting/lateral leans, Sit to/from stand Lower Body Bathing Details (indicate cue type and reason): due to difficulty standing s/p R BKA Upper Body Dressing : Set up, Sitting Lower  Body Dressing: Maximal assistance, Sitting/lateral leans Lower Body Dressing Details (indicate cue type and reason): don sock to L foot Toilet Transfer: Minimal assistance, RW(from high bed; assist to control descent) Toilet Transfer Details (indicate cue type and reason): simulated with mobility and transfer to recliner, improved ability to maintain weight bearing through L LE and hop to perform transfer Functional mobility during ADLs: Minimal assistance, Moderate assistance, +2 for safety/equipment, Rolling walker, Cueing for sequencing, Cueing for safety General ADL Comments: sat EOB for several minutes then stood for about 30 seconds prior to transferring to chair    Mobility  Overal bed mobility: Needs Assistance Bed Mobility: Supine to Sit Rolling: Mod assist Supine to sit: Min guard, HOB elevated Sit to supine: Supervision General bed mobility comments: increased time and effort; use of rail    Transfers  Overall transfer level: Needs assistance Equipment used: Rolling walker (2 wheeled) Transfers: Sit to/from Stand Sit to Stand: Mod assist Stand pivot transfers: Min assist, +2 safety/equipment General transfer comment: assist to power up into standing from EOB and recliner; cues for hand placement    Ambulation / Gait / Stairs / Wheelchair Mobility  Ambulation/Gait Ambulation/Gait assistance: Min assist, +2 safety/equipment Gait Distance (Feet): 25 Feet Assistive device: Rolling walker (2 wheeled) Gait Pattern/deviations: Step-to pattern General Gait Details: cues for sequencing/upright posture; assist to steady and guide RW; L shoe donned and pt with improved step length and foot clearance Gait velocity: decreased Gait velocity interpretation: <1.31 ft/sec, indicative of household ambulator    Posture / Balance Dynamic Sitting Balance Sitting balance - Comments: patient able to maintain stability at edge of bed this session Balance Overall balance assessment: Needs  assistance Sitting-balance support: No upper extremity supported, Feet supported Sitting balance-Leahy Scale: Good Sitting balance - Comments: patient able to maintain stability at edge of bed this session Standing balance support: Bilateral upper extremity supported, During functional activity Standing balance-Leahy Scale: Poor Standing balance comment: reliant on B UE support and min A x2 however able to bear weight through L LE this  session    Special needs/care consideration BiPAP/CPAP  CPM  Continuous Drip IV  Dialysis MWF hemodialysis; daughter transports Life Vest  No longer uses as LIFE vest Oxygen  Special Bed  Trach Size  Wound Vac  Skin R BKA surgical site; MASD to buttocks; Stage 2 to sacrum Bowel mgmt: continent Bladder mgmt:  anuric Diabetic mgmt:  Behavioral consideration  Chemo/radiation  Designated visitor is daughter, [place]: Alone(has stayed with his exwife before returning home alone)  Lives With: Alone Available Help at Discharge: (daughter, [name] and his exwife, Lawyer) Type of Home: House Home Layout: One level Home Access: Stairs to enter, Ramped entrance Entrance Stairs-Rails: Left Entrance Stairs-Number of Steps: 1 Bathroom Shower/Tub: Optometrist: Yes How Accessible: Accessible via walker [place]: No Additional Comments: when asked today if patient lives with anyone states he lives alone but that his daughter and ex-wife would help him after discharge  Discharge Living Setting Plans for Discharge Living Setting: Patient's home, Alone Type of Home at Discharge: House Discharge Home Layout: One level Discharge Home Access: Stairs to enter Entrance Stairs-Rails: Left Entrance Stairs-Number of Steps: 1 Discharge Bathroom Shower/Tub: Tub/shower unit Discharge Bathroom Toilet: Standard Discharge Bathroom Accessibility: Yes How  Accessible: Accessible via walker Does the patient have any problems obtaining your medications?: No  Social/Family/Support Systems Patient Roles: Parent Contact Information: Erin, Daughter Anticipated Caregiver: [name] and her Mom, [name]. Patient has been divorced from [place] for 38 years Anticipated Caregiver's Contact Information: see above Ability/Limitations of Caregiver: [name] works days but [name], can come stay with him when [name] works Caregiver Availability: 24/7 Discharge Plan Discussed with Primary Caregiver: Yes Is Caregiver In Agreement with Plan?: Yes Does Caregiver/Family have Issues with Lodging/Transportation while Pt is in Rehab?: Yes  Goals/Additional Needs Patient/Family Goal for Rehab: Mod I to supervision  with PT and OT at wheelcahir level Expected length of stay: ELOS 7 to 10 days Equipment Needs: Hemodialysis MWF; daughter transports Pt/Family Agrees to Admission and willing to participate: Yes Program Orientation Provided & Reviewed with Pt/Caregiver Including Roles  & Responsibilities: Yes  Decrease burden of Care through IP rehab admission:   Possible need for SNF placement upon discharge:   Patient Condition: I have reviewed medical records from [facility] , spoken with CM, and patient and daughter. I met with patient at the bedside for inpatient rehabilitation assessment.  Patient will benefit from ongoing PT and OT, can actively participate in 3 hours of therapy a day 5 days of the week, and can make measurable gains during the admission.  Patient will also benefit from the coordinated team approach during an Inpatient Acute Rehabilitation admission.  The patient will receive intensive therapy as well as Rehabilitation physician, nursing, social worker, and care management interventions.  Due to bladder management, bowel management, safety, skin/wound care, disease management, medication administration, pain management and patient education the patient  requires 24 hour a day rehabilitation nursing.  The patient is currently min to mod assist with mobility and basic ADLs.  Discharge setting and therapy post discharge at home with home health is anticipated.  Patient has agreed to participate in the Acute Inpatient Rehabilitation Program and will admit today.  Preadmission Screen Completed By:  Cleatrice Burke, 05/18/2019 11:45 AM ______________________________________________________________________   Discussed status with Dr. Letta Pate  on  05/18/2019 at  1155 and received approval for admission today.  Admission Coordinator:  Cleatrice Burke,  RN, time  1155 Date  05/18/2019   Assessment/Plan: Diagnosis: Right below-knee amputation due to right foot gangrene 1. Does the need for close, 24 hr/day Medical supervision in concert with the patient's rehab needs make it unreasonable for this patient to be served in a less intensive setting? Yes 2. Co-Morbidities requiring supervision/potential complications: End-stage renal disease on hemodialysis, bacteremia on IV vancomycin, postoperative pain management, transition from IV to p.o. medications 3. Due to bladder management, bowel management, safety, skin/wound care, disease management, medication administration, pain management and patient education, does the patient require 24 hr/day rehab nursing? Yes 4. Does the patient require coordinated care of a physician, rehab nurse, PT, OT, and SLP to address physical and functional deficits in the context of the above medical diagnosis(es)? Yes Addressing deficits in the following areas: balance, endurance, locomotion, strength, transferring, bowel/bladder control, bathing, dressing, feeding, grooming, toileting, cognition and psychosocial support 5. Can the patient actively participate in an intensive therapy program of at least 3 hrs of therapy 5 days a week? Yes 6. The potential for patient to make measurable gains while on inpatient rehab is  good 7. Anticipated functional outcomes upon discharge from inpatient rehab: modified independent PT, modified independent OT, n/a SLP 8. Estimated rehab length of stay to reach the above functional goals is: 7 to 10 days 9. Anticipated discharge destination: Home 10. Overall Rehab/Functional Prognosis: good   MD Signature: Charlett Blake M.D.  Medical Group FAAPM&R (Neuromuscular Med) Diplomate Am Board of Electrodiagnostic Med Fellow Am Board of Interventional Pain

## 2019-05-18 NOTE — Progress Notes (Signed)
MOBILITY TEAM - Progress Note   05/18/19 1600  Mobility  Activity Transferred:  Chair to bed;Transferred to/from [facility]  Level of Assistance Moderate assist, patient does 50-74%  Mobility Response Tolerated fair  Bed Position Semi-fowlers   Pt tolerated transfer from recliner to bed to [facility] and back to bed with RW and min-modA; stability improving. Preparing for d/c to CIR this afternoon.  Mabeline Caras, PT, DPT Mobility Team Pager [phone]

## 2019-05-18 NOTE — Progress Notes (Signed)
Occupational Therapy Treatment Patient Details Name: Jose Garrett MRN: [id number] DOB: September 14, 1950 Today's Date: 05/18/2019    History of present illness Pt is a 69 y.o. male with recent hx of R LE bypass (04/10/19), now admitted 04/29/19 with progressive gangrenous changes of toes and dorsum of foot. S/p R transmet amputation 2/17. S/p R BKA 2/25. PMH includes aortic stenosis, HTN, HLD, CHF, CAD, PD, anxiety, ESRD (HD MWF). 2-25 pt had BKA.   OT comments  Pt making progress with functional goals. Pt limited by fatigue this session, bu agreeable to therapy. Pt went to HD this morning. Session focused on Ln self care using compensatory techniques seated EOB, sit - stand transitions, functional transfers and activity tolerance. Pt to transfer to CIR later today  Follow Up Recommendations  CIR    Equipment Recommendations  Other (comment)(TBD at next venue of care)    Recommendations for Other Services      Precautions / Restrictions Precautions Precautions: Fall Restrictions Weight Bearing Restrictions: Yes RLE Weight Bearing: Non weight bearing       Mobility Bed Mobility               General bed mobility comments: pt sittiing EOB upon arrival  Transfers Overall transfer level: Needs assistance Equipment used: Rolling walker (2 wheeled) Transfers: Sit to/from Stand Sit to Stand: Mod assist;Max assist;+2 physical assistance Stand pivot transfers: Mod assist;+2 physical assistance       General transfer comment: mod A  + 2  first trial, max A +2  2 more trials. Assist to power up into standing from EOB and recliner; cues for hand placement    Balance Overall balance assessment: Needs assistance Sitting-balance support: No upper extremity supported;Feet supported Sitting balance-Leahy Scale: Fair Sitting balance - Comments: posterior lean during levating R UE   Standing balance support: Bilateral upper extremity supported;During functional activity Standing  balance-Leahy Scale: Poor                             ADL either performed or assessed with clinical judgement   ADL Overall ADL's : Needs assistance/impaired     Grooming: Wash/dry hands;Wash/dry face;Set up;Supervision/safety;Sitting   Upper Body Bathing: Supervision/ safety;Set up;Sitting Upper Body Bathing Details (indicate cue type and reason): simulated Lower Body Bathing: Minimal assistance;Sitting/lateral leans   Upper Body Dressing : Set up;Supervision/safety;Sitting   Lower Body Dressing: Moderate assistance;Sitting/lateral leans Lower Body Dressing Details (indicate cue type and reason): simulated with compensatory techniques Toilet Transfer: Moderate assistance;Stand-pivot;Ambulation;RW;BSC   Toileting- Clothing Manipulation and Hygiene: Maximal assistance;Sit to/from stand       Functional mobility during ADLs: Moderate assistance;Maximal assistance;+2 for physical assistance;Rolling walker;Cueing for safety       Vision Baseline Vision/History: Wears glasses Wears Glasses: At all times Patient Visual Report: No change from baseline     Perception     Praxis      Cognition Arousal/Alertness: Awake/alert Behavior During Therapy: WFL for tasks assessed/performed Overall Cognitive Status: Within Functional Limits for tasks assessed                                          Exercises     Shoulder Instructions       General Comments      Pertinent Vitals/ Pain       Pain Assessment: Faces Faces Pain Scale: Hurts  little more Pain Location: R residual limb  Pain Descriptors / Indicators: Guarding;Grimacing Pain Intervention(s): Monitored during session;Repositioned  Home Living   Living Arrangements: Alone(has stayed with his exwife before returning home alone) Available Help at Discharge: (daughter, [name] and his exwife, Lawyer)     Technical brewer of Steps: 1               Bathroom Accessibility:  Yes How Accessible: Accessible via walker        Lives With: Alone    Prior Functioning/Environment              Frequency  Min 2X/week        Progress Toward Goals  OT Goals(current goals can now be found in the care plan section)  Progress towards OT goals: Progressing toward goals  Acute Rehab OT Goals Patient Stated Goal: Pt wanting to go to CIR for therapy and then hoping to go home  Plan Discharge plan remains appropriate    Co-evaluation    PT/OT/SLP Co-Evaluation/Treatment: Yes Reason for Co-Treatment: For patient/therapist safety;To address functional/ADL transfers   OT goals addressed during session: ADL's and self-care;Proper use of Adaptive equipment and DME      AM-PAC OT "6 Clicks" Daily Activity     Outcome Measure   Help from another person eating meals?: None Help from another person taking care of personal grooming?: None Help from another person toileting, which includes using toliet, bedpan, or urinal?: A Lot Help from another person bathing (including washing, rinsing, drying)?: A Lot Help from another person to put on and taking off regular upper body clothing?: None Help from another person to put on and taking off regular lower body clothing?: A Lot 6 Click Score: 18    End of Session Equipment Utilized During Treatment: Gait belt;Rolling walker;Other (comment)(BSC)  OT Visit Diagnosis: Other abnormalities of gait and mobility (R26.89);Muscle weakness (generalized) (M62.81);Pain Pain - Right/Left: Right Pain - part of body: (residual limb)   Activity Tolerance Patient limited by fatigue   Patient Left in chair;with call bell/phone within reach;with chair alarm set   Nurse Communication          Time: 3785-8850 OT Time Calculation (min): 25 min  Charges: OT General Charges $OT Visit: 1 Visit OT Treatments $Self Care/Home Management : 8-22 mins     Britt Bottom 05/18/2019, 2:45 PM

## 2019-05-18 NOTE — TOC Transition Note (Signed)
Transition of Care [facility]) - CM/SW Discharge Note Marvetta Gibbons RN, BSN Transitions of Care Unit 4E- RN Case Manager [phone]   Patient Details  Name: WINTON OFFORD MRN: [id number] Date of Birth: Jul 19, 1950  Transition of [facility]) CM/SW Contact:  Dawayne Patricia, RN Phone Number: 05/18/2019, 4:15 PM   Clinical Narrative:    Pt stable for transition to rehab today, Per Pamala Hurry with [facility] re[facility] pt has received approval with insurance appeal for INPT rehab. Bed available today and plan to transition this afternoon to [facility] rehab. Pt will need 4wks of abx given with HD.    Final next level of care: IP Rehab Facility Barriers to Discharge: Barriers Resolved   Patient Goals and CMS Choice Patient states their goals for this hospitalization and ongoing recovery are:: "continue to get better and feel better"   Choice offered to / list presented to : Patient  Discharge Placement               [facility]        Discharge Plan and Services In-house Referral: Clinical Social Work   Post Acute Care Choice: [place]            DME Agency: NA       HH Arranged: PT, OT [place] Agency: NA        Social Determinants of Health (SDOH) Interventions     Readmission Risk Interventions Readmission Risk Prevention Plan 04/15/2019 10/23/2018 08/20/2018  Transportation Screening Complete Complete -  PCP or Specialist Appt within 3-5 Days - Complete -  HRI or [place] - Complete -  Social Work Consult for [place] Planning/Counseling - Complete -  [place] - Not Applicable -  Medication Review Press photographer) Complete Complete -  PCP or Specialist appointment within 3-5 days of discharge Complete - Complete  HRI or Home Care Consult Complete - Complete  SW Recovery Care/Counseling Consult Complete - Complete  Palliative Care Screening Not Applicable - Not [place] Patient Refused - Not  Applicable  Some recent data might be hidden

## 2019-05-19 ENCOUNTER — Inpatient Hospital Stay (HOSPITAL_COMMUNITY): Payer: Medicare Other | Admitting: Occupational Therapy

## 2019-05-19 ENCOUNTER — Inpatient Hospital Stay (HOSPITAL_COMMUNITY): Payer: Medicare Other

## 2019-05-19 DIAGNOSIS — I5042 Chronic combined systolic (congestive) and diastolic (congestive) heart failure: Secondary | ICD-10-CM

## 2019-05-19 DIAGNOSIS — D649 Anemia, unspecified: Secondary | ICD-10-CM

## 2019-05-19 DIAGNOSIS — R7881 Bacteremia: Secondary | ICD-10-CM

## 2019-05-19 DIAGNOSIS — N186 End stage renal disease: Secondary | ICD-10-CM

## 2019-05-19 DIAGNOSIS — I951 Orthostatic hypotension: Secondary | ICD-10-CM

## 2019-05-19 DIAGNOSIS — Z992 Dependence on renal dialysis: Secondary | ICD-10-CM

## 2019-05-19 LAB — CBC
HCT: 23.3 % — ABNORMAL LOW (ref 39.0–52.0)
Hemoglobin: 7.3 g/dL — ABNORMAL LOW (ref 13.0–17.0)
MCH: 32 pg (ref 26.0–34.0)
MCHC: 31.3 g/dL (ref 30.0–36.0)
MCV: 102.2 fL — ABNORMAL HIGH (ref 80.0–100.0)
Platelets: 109 10*3/uL — ABNORMAL LOW (ref 150–400)
RBC: 2.28 MIL/uL — ABNORMAL LOW (ref 4.22–5.81)
RDW: 14.3 % (ref 11.5–15.5)
WBC: 6.5 10*3/uL (ref 4.0–10.5)
nRBC: 0 % (ref 0.0–0.2)

## 2019-05-19 LAB — CULTURE, BLOOD (SINGLE)
Culture: NO GROWTH
Culture: NO GROWTH
Special Requests: ADEQUATE
Special Requests: ADEQUATE

## 2019-05-19 MED ORDER — CHLORHEXIDINE GLUCONATE CLOTH 2 % EX PADS
6.0000 | MEDICATED_PAD | Freq: Every day | CUTANEOUS | Status: DC
  Administered 2019-05-21: 6 via TOPICAL

## 2019-05-19 NOTE — Progress Notes (Signed)
Pharmacist telephoned HD unit inquiring if vancomycin dose was given on 05/18/19. Writer is Advertising account executive today and spoke with primary HD nurse Burke Keels, RN for pt. On 05/18/19 verified with nurse that vancomycin 750mg  dose was given and that she spoke with pharmacist on 05/18/19 to notify due to new order of 500mg  vancomycin had been ordered.

## 2019-05-19 NOTE — Evaluation (Signed)
Physical Therapy Assessment and Plan  Patient Details  Name: BOYD BUFFALO MRN: [id number] Date of Birth: 03-10-1951  PT Diagnosis: Abnormal posture, Abnormality of gait, Difficulty walking, Impaired sensation and Muscle weakness Rehab Potential: Good ELOS: 10-12 days   Today's Date: 05/19/2019 PT Individual Time: 1015-1110 PT Individual Time Calculation (min): 55 min    Problem List:  Patient Active Problem List   Diagnosis Date Noted  . Right below-knee amputee ([place]) 05/18/2019  . Pressure injury of skin 05/14/2019  . Enterococcal bacteremia 05/13/2019  . Polymorphic ventricular tachycardia ([place]) 10/24/2018  . S/P TAVR (transcatheter aortic valve replacement) 10/21/2018  . Anxiety   . Coronary artery disease involving native coronary artery of native heart without angina pectoris   . Thrombocytopenia ([place])   . PAD (peripheral artery disease) ([place]) 07/22/2018  . Acute on chronic combined systolic and diastolic CHF (congestive heart failure) ([place]) 11/30/2017  . ESRD (end stage renal disease) on dialysis ([place])   . Iron deficiency anemia 11/22/2015  . AL amyloidosis ([place]) 09/25/2014  . Anemia of chronic disease 09/25/2014  . Essential hypertension, benign 11/06/2013    Past Medical History:  Past Medical History:  Diagnosis Date  . Amyloidosis ([place])   . Anemia of chronic disease   . Anxiety   . CAD (coronary artery disease)    a. cardiac cath on 11/25/17 with occlusion of the RCA which could have been his event but the RCA could not be engaged. The RCA filled distally from left to right collaterals. There were no severe stenoses in the left coronary system. Medical therapy recommended, started on plavix.  . Chronic combined systolic and diastolic CHF (congestive heart failure) ([place])    a. EF dropped to 35-40% with grade 2 DD by echo 11/2017.  [name] DJD (degenerative joint disease), lumbar   . ESRD (end stage renal disease) ([place])    ESRD- HEMO MWF- [employer]  . GERD  (gastroesophageal reflux disease)   . Hyperlipidemia   . Hypertension   . Myocardial infarction ([place])   . Neuropathy   . Peripheral vascular disease ([place])   . Pneumonia   . Restless legs   . S/P TAVR (transcatheter aortic valve replacement) 10/21/2018   26 mm Edwards Sapien 3 Ultra transcatheter heart valve placed via percutaneous right transfemoral approach   . Severe aortic stenosis    severe   Past Surgical History:  Past Surgical History:  Procedure Laterality Date  . ABDOMINAL AORTOGRAM W/LOWER EXTREMITY Bilateral 07/16/2018   Procedure: ABDOMINAL AORTOGRAM W/LOWER EXTREMITY;  Surgeon: Wellington Hampshire, MD;  Location: [place] [facility];  Service: Cardiovascular;  Laterality: Bilateral;  . ABDOMINAL AORTOGRAM W/LOWER EXTREMITY N/A 04/09/2019   Procedure: ABDOMINAL AORTOGRAM W/LOWER EXTREMITY - Right Lower;  Surgeon: Waynetta Sandy, MD;  Location: [place] [facility];  Service: Cardiovascular;  Laterality: N/A;  . AMPUTATION Left 08/19/2018   Procedure: AMPUTATION LEFT TOES SECOND AND THIRD;  Surgeon: Waynetta Sandy, MD;  Location: [place];  Service: Vascular;  Laterality: Left;  . AMPUTATION Right 05/07/2019   Procedure: AMPUTATION BELOW KNEE;  Surgeon: Serafina Mitchell, MD;  Location: [facility] OR;  Service: Vascular;  Laterality: Right;  . AV FISTULA PLACEMENT Right 12/27/2015   Procedure: Right Arm ARTERIOVENOUS (AV) FISTULA CREATION;  Surgeon: Angelia Mould, MD;  Location: [place];  Service: Vascular;  Laterality: Right;  . CATARACT EXTRACTION W/ INTRAOCULAR LENS  IMPLANT, BILATERAL    . COLONOSCOPY    . ERCP W/ SPHICTEROTOMY    .  FEMORAL-POPLITEAL BYPASS GRAFT Left 07/22/2018   Procedure: left FEMORAL TO BELOW KNEE POPLITEAL ARTERY BYPASS GREAFT using 42m removable ring propaten gore graft;  Surgeon: CWaynetta Sandy MD;  [place]  Service: Vascular;  Laterality: Left;  . FEMORAL-POPLITEAL BYPASS GRAFT Right 04/10/2019   Procedure: BYPASS GRAFT  RIGHT FEMORAL-POPLITEAL ARTERY;  Surgeon: ERosetta Posner MD;  [place]  Service: Vascular;  Laterality: Right;  . I & D EXTREMITY Right 05/05/2019   Procedure: Revision of  RIGHT  FOOT Amputation;  Surgeon: CWaynetta Sandy MD;  [place]  Service: Vascular;  Laterality: Right;  . INTRAOPERATIVE TRANSTHORACIC ECHOCARDIOGRAM N/A 10/21/2018   Procedure: Intraoperative Transthoracic Echocardiogram;  Surgeon: CSherren Mocha MD;  [place]  Service: Open Heart Surgery;  Laterality: N/A;  . IR GENERIC HISTORICAL  01/27/2016   IR FLUORO GUIDE CV LINE RIGHT 01/27/2016 DArne Cleveland MD [facility]-INTER[facility] RAD  . IR GENERIC HISTORICAL  01/27/2016   IR [place] ACCESS RIGHT 01/27/2016 DArne Cleveland MD [facility]INTER[facility]  . LAPAROSCOPIC CHOLECYSTECTOMY  03/2011  . LEFT HEART CATH AND CORONARY ANGIOGRAPHY N/A 11/25/2017   Procedure: LEFT HEART CATH AND CORONARY ANGIOGRAPHY;  Surgeon: JMartinique Peter M, MD;  [facility];  Service: Cardiovascular;  Laterality: N/A;  . LEFT HEART CATH AND CORONARY ANGIOGRAPHY N/A 07/24/2018   Procedure: LEFT HEART CATH AND CORONARY ANGIOGRAPHY;  Surgeon: CSherren Mocha MD;  [place];  Service: Cardiovascular;  Laterality: N/A;  . RENAL BIOPSY, PERCUTANEOUS Right 09/08/2014  . RIGHT/LEFT HEART CATH AND CORONARY ANGIOGRAPHY N/A 03/25/2018   Procedure: RIGHT/LEFT HEART CATH AND CORONARY ANGIOGRAPHY;  Surgeon: JMartinique Peter M, MD;  [facility];  Service: Cardiovascular;  Laterality: N/A;  . TEE WITHOUT CARDIOVERSION N/A 05/14/2019   Procedure: TRANSESOPHAGEAL ECHOCARDIOGRAM (TEE);  Surgeon: RFay Records MD;  [facility];  Service: Cardiovascular;  Laterality: N/A;  . TOE AMPUTATION Left 08/19/2018   2nd & 3rd toes  . TONSILLECTOMY  ~ 1960  . TRANSCATHETER AORTIC VALVE REPLACEMENT, TRANSFEMORAL N/A 10/21/2018   Procedure: TRANSCATHETER AORTIC VALVE REPLACEMENT, TRANSFEMORAL;  Surgeon: CSherren Mocha MD;  [place] [facility]  Service: Open Heart Surgery;  Laterality: N/A;  . TRANSMETATARSAL AMPUTATION Right 04/29/2019   Procedure: RIGHT TRANSMETATARSAL AMPUTATION;  Surgeon: CWaynetta Sandy MD;  [place] [facility] [facility]  Service: Vascular;  Laterality: Right;  . ULTRASOUND GUIDANCE FOR VASCULAR ACCESS  03/25/2018   Procedure: Ultrasound Guidance For Vascular Access;  Surgeon: JMartinique Peter M, MD;  [facility];  Service: Cardiovascular;;    Assessment & Plan Clinical Impression: Patient is a 69y.o. year old male with history of CADstatus post TAVR procedure August 2020 maintained on Plavix as well as amiodarone, chronic combined systolic and diastolic congestive heart failure. End-stage renal disease [facility] Wednesday [facility], hyperlipidemia, quit smoking 8 years ago. Patient did receive inpatient rehab services 07/28/2018 to 08/13/2018 for debility related to PAD status post left common femoral to below-knee popliteal artery bypass and discharged to home contact-guard assist mobility. Presented 04/29/2019 for gangrenous changes of the right foot as well as recent right lower extremity bypass and no change with conservative care undergoing right transmetatarsal amputation 04/29/2019 per Dr. CDonzetta Matters Postoperatively with plantar flap becoming necrotic returning back to the OR for revision of right transmetatarsal amputation removal of necrotic tissue 05/04/2018 unfortunately with progressive gangrenous necrotic changes and ultimately underwent right BKA 05/06/2018 per Dr BTrula Slade.  Hemodialysis ongoing as per renal services. Patient doing well until 05/11/2019 isolated fever of 103 blood cultures grew Enterococcus placed on vancomycinx4 weeks through 06/10/2019. Infectious disease consulted with TEE to evaluate TAVRshowed no vegetation and no valvular AI.[name] Acute on chronic anemia 7.7and monitored. Subcutaneous heparin for DVT  prophylaxis.  Patient currently requires min with mobility secondary to muscle weakness and decreased standing balance, decreased postural control, decreased balance strategies and difficulty maintaining precautions. Prior to hospitalization, patient was modified independent  with mobility and lived with Alone in a House home. Home access is 1Stairs to enter.  Patient will benefit from skilled PT intervention to maximize safe functional mobility, minimize fall risk and decrease caregiver burden for planned discharge home with intermittent assist.  Anticipate patient will benefit from follow up [facility] at discharge.  PT - End of Session Activity Tolerance: Tolerates 30+ min activity with multiple rests Endurance Deficit: Yes Endurance Deficit Description: easily fatigued, required rest breaks throughout session PT Assessment Rehab Potential (ACUTE/IP ONLY): Good PT Barriers to Discharge: Home environment access/layout;Weight bearing restrictions PT Barriers to Discharge Comments: 1 STE with 0 rails, WC not wide enough to fit in bathroom PT Patient demonstrates impairments in the following area(s): Balance;Endurance;Motor;Pain;Sensory PT Transfers Functional Problem(s): Bed Mobility;Bed to Chair;Car;Furniture PT Locomotion Functional Problem(s): Ambulation;Wheelchair Mobility;Stairs PT Plan PT Intensity: Minimum of 1-2 x/day ,45 to 90 minutes PT Frequency: 5 out of 7 days PT Duration Estimated Length of Stay: 10-12 days PT Treatment/Interventions: Ambulation/gait training;Discharge planning;Functional mobility training;Psychosocial support;Therapeutic Activities;Balance/vestibular training;Disease management/prevention;Neuromuscular re-education;Skin care/wound management;Therapeutic Exercise;Wheelchair propulsion/positioning;DME/adaptive equipment instruction;Pain management;Splinting/orthotics;UE/LE Strength taining/ROM;Community reintegration;Functional electrical stimulation;Patient/family  education;Stair training;UE/LE Coordination activities PT Transfers Anticipated Outcome(s): Mod I with LRAD PT Locomotion Anticipated Outcome(s): Supervision with LRAD PT Recommendation Follow Up Recommendations: Home health PT Patient destination: Home Equipment Recommended: To be determined Equipment Details: Pt has RW, SPC, rollator, and manual WC  Skilled Therapeutic Intervention Evaluation completed (see details above and below) with education on PT POC and goals and individual treatment initiated with focus on functional mobility/transfers, UE/LE strength, dynamic sitting/standing balance and coordination, amputee education, simulated car transfers, and improved endurance with activity. Received pt supine in bed, pt agreeable to therapy, and reported low back pain but denied any pain in R residual limb. Repositioning and distractions done to reduce pain. Pt performed bed mobility with CGA with HOB elevated. Pt transferred bed<>WC without AD min A using lateral scoot/squat<>pivot technique. Pt performed WC mobility 185f using bilateral UEs and supervision. Pt performed car transfer without AD mod A. Pt required verbal cues for technique and safety. Pt transferred sit<>stand with RW min A. Pt ambulated 232fwith RW min A +2 for WC follow. Pt reported difficulty feeling LLE on floor with ambulation due to peripheral neuropathy. Pt reported 6/10 fatigue after activity. Pt educated on strategies to reduce phamtom limb pain including desensitization, mental imagery, and mobility. PT [facility]. Concluded session with pt sitting in WC, needs within reach, and chair pad alarm on. Safety plan updated.  PT Evaluation Precautions/Restrictions Precautions Precautions: Fall Precaution Comments: NWB RLE Restrictions Weight Bearing Restrictions: Yes RLE Weight Bearing: Non weight bearing Home Living/Prior Functioning Home Living Available Help at Discharge:  Family;Available PRN/intermittently(daughter and ex-wife) Type of Home: House Home Access: Stairs to enter [employer]f Steps: 1 Entrance Stairs-Rails: None Home Layout: One level Bathroom Shower/Tub: TuChiropodistStandard Bathroom Accessibility: No Additional Comments: doesn't think a WC would be able to fit but a RW would  Lives With: Alone Prior Function Level of Independence: Independent with basic ADLs;Independent with transfers;Requires assistive device for independence;Independent with homemaking with ambulation(used SPC to get around)  Able to Take Stairs?: Yes Driving: No(driving occasionally as needed) 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 Daughter and ex-wife assisted with some household ADLs and driving him around Cognition Overall Cognitive Status: Within Functional Limits for tasks assessed Arousal/Alertness: Awake/alert Orientation Level: Oriented X4 Memory: Appears intact Awareness: Appears intact Problem Solving: Appears intact Safety/Judgment: Appears intact Sensation Sensation Light Touch: Impaired by gross assessment Proprioception: Impaired by gross assessment Additional Comments: decreased below L knee due to peripheral neuropathy; decreased around incision site on RLE Coordination Gross Motor Movements are Fluid and Coordinated: No Fine Motor Movements are Fluid and Coordinated: Yes Coordination and Movement Description: grossly uncoordinated due to R BKA and peripheral neuropathy on LLE Finger Nose Finger Test: [facility] Heel Shin Test: WFL (except for R BKA) Motor  Motor Motor: Abnormal postural alignment and control Motor - Skilled Clinical Observations: grossly uncoordinated due to R BKA and peripheral neuropathy on LLE  Mobility Bed Mobility Bed Mobility: Rolling Right;Rolling Left;Supine to Sit Rolling Right:  Supervision/verbal cueing Rolling Left: Supervision/Verbal cueing Supine to Sit: Contact Guard/Touching assist Transfers Transfers: Sit to Stand;Squat Pivot Transfers Sit to Stand: Minimal Assistance - Patient > 75% Stand Pivot Transfers: Minimal Assistance - Patient > 75% Stand Pivot Transfer Details: Verbal cues for sequencing;Verbal cues for technique;Verbal cues for precautions/safety Stand Pivot Transfer Details (indicate cue type and reason): verbal cues for safety with technique Squat Pivot Transfers: Minimal Assistance - Patient > 75% Transfer (Assistive device): None Locomotion  Gait Ambulation: Yes Gait Assistance: 2 Helpers Gait Distance (Feet): 22 Feet Assistive device: Rolling walker Gait Assistance Details: Verbal cues for sequencing;Verbal cues for technique;Verbal cues for precautions/safety Gait Assistance Details: verbal cues for "hopping' technique Gait Gait: Yes Gait Pattern: Impaired Gait Pattern: Decreased trunk rotation;Antalgic;Decreased stride length;Decreased step length - left;Poor foot clearance - left Gait velocity: decreased Wheelchair Mobility Wheelchair Mobility: Yes Wheelchair Assistance: Chartered loss adjuster: Both upper extremities Wheelchair Parts Management: Needs assistance Distance: 1105f  Trunk/Postural Assessment  Cervical Assessment Cervical Assessment: Within Functional Limits Thoracic Assessment Thoracic Assessment: Within Functional Limits Lumbar Assessment Lumbar Assessment: Exceptions to WFL(posterior pelvic tilt) Postural Control Postural Control: Deficits on evaluation  Balance Balance Balance Assessed: Yes Static Sitting Balance Static Sitting - Balance Support: No upper extremity supported Static Sitting - Level of Assistance: 5: Stand by assistance Dynamic Sitting Balance Dynamic Sitting - Balance Support: No upper extremity supported Dynamic Sitting - Level of Assistance: 5: Stand by  assistance Static Standing Balance Static Standing - Balance Support: Bilateral upper extremity supported(RW) Static Standing - Level of Assistance: 5: Stand by assistance(CGA) Dynamic Standing Balance Dynamic Standing - Balance Support: Bilateral upper extremity supported(RW) Dynamic Standing - Level of Assistance: 4: Min assist Extremity Assessment  RLE Assessment RLE Assessment: Exceptions to [facility] HospitalGeneral Strength Comments: grossly generalized to 3+/5 (hip flexion, add/abd) LLE Assessment LLE Assessment: Exceptions to WTotal [facility] Care Surgery Center IncGeneral Strength Comments: grossly generalized to 4-/5  Refer to Care Plan for Long Term Goals  Recommendations for other services: None   Discharge Criteria: Patient will be discharged from PT if patient refuses treatment 3 consecutive times without medical reason, if treatment goals not met, if there is a change in medical status, if patient makes no progress towards goals or if patient is discharged from hospital.  The above assessment, treatment  plan, treatment alternatives and goals were discussed and mutually agreed upon: by patient  Alfonse Alpers PT, DPT  05/19/2019, 7:39 AM

## 2019-05-19 NOTE — Progress Notes (Signed)
[facility] Progress Note   Subjective:   Patient seen in room, now on CIR. Reports HD went well yesterday. Fatigued from therapy. Still having intermittent nausea. Denies SOB, CP, dizziness, palpitations, abdominal pain.   Objective Vitals:   05/18/19 1648 05/18/19 1930 05/18/19 2202 05/19/19 0439  BP: (!) 146/52 140/63  (!) 150/58  Pulse: 66 68 65 61  Resp: 18 17  16   Temp: 98 F (36.7 C) 98.2 F (36.8 C)  98 F (36.7 C)  TempSrc: Oral     SpO2: 100% 99%  100%  Weight: 68.2 kg     Height: 5\' 10"  (1.778 m)      Physical Exam General:Well developed, well nourished male. Alert and in NAD Heart:RRR, no murmurs, rubs or gallops Lungs:CTA bilaterally without wheezing, rhonchi or rales Abdomen:Soft, non-tender, non-distended. +BS Extremities:RLE BKA, LLE without edema Dialysis Access:RUE AVF + thrill/bruit   Additional Objective Labs: Basic Metabolic Panel: Recent Labs  Lab 05/16/19 1142 05/17/19 0218 05/18/19 0240  NA 138 137 138  K 3.8 3.4* 3.9  CL 100 97* 99  CO2 22 23 23   GLUCOSE 79 97 95  BUN 29* 38* 46*  CREATININE 6.20* 7.23* 8.65*  CALCIUM 8.2* 8.2* 8.4*  PHOS 4.8* 5.4* 6.4*   Liver Function Tests: Recent Labs  Lab 05/16/19 1142 05/17/19 0218 05/18/19 0240  ALBUMIN 2.1* 2.1* 2.1*   CBC: Recent Labs  Lab 05/13/19 0955 05/13/19 0955 05/15/19 0217 05/18/19 0626 05/19/19 0013  WBC 7.9   < > 6.6 6.3 6.5  HGB 7.1*   < > 7.7* 7.6* 7.3*  HCT 23.1*   < > 24.5* 24.3* 23.3*  MCV 105.0*  --  102.1* 102.1* 102.2*  PLT 122*   < > 110* 102* 109*   < > = values in this interval not displayed.   Blood Culture    Component Value Date/Time   SDES BLOOD LEFT ANTECUBITAL 05/14/2019 0522   SPECREQUEST AEROBIC BOTTLE ONLY Blood Culture adequate volume 05/14/2019 0522   CULT  05/14/2019 0522    NO GROWTH 4 DAYS Performed at [facility], [place] [address] [place], [place] [address]    REPTSTATUS PENDING 05/14/2019 0522    Medications: . [START ON 05/20/2019] vancomycin     . amiodarone  200 mg Oral Daily  . atorvastatin  80 mg Oral Q supper  . [START ON 05/20/2019] calcitRIOL  1.5 mcg Oral Q M,W,F-HD  . carvedilol  3.125 mg Oral BID  . clopidogrel  75 mg Oral Q supper  . [START ON 05/22/2019] darbepoetin (ARANESP) injection - DIALYSIS  100 mcg Intravenous Q Fri-HD  . docusate sodium  100 mg Oral Daily  . heparin  5,000 Units Subcutaneous Q8H  . [START ON 05/20/2019] midodrine  10 mg Oral Q M,W,F-HD  . multivitamin  1 tablet Oral QHS  . pantoprazole  40 mg Oral Daily  . sucroferric oxyhydroxide  1,500 mg Oral TID WC  . vitamin B-12  1,000 mcg Oral QPM    Dialysis Orders: MWF [facility]   4h  400/800  76.5kg  2/2 bath  P4  RUE AVF  Hep 2400  - Parsabiv 5mg  IV q HD  - Calcitriol 1.33mcg PO q HD  - Mircera 25mcg IV q 2 weeks (last given 42mcg on 1/20, Hgb 10.3 on 2/10)  Assessment/Plan: 1. PAD w/ gangrenous changes -s/p R TMA on 2/1 healing poorly so s/p R BKA on 2/25. Now on CIR. 2. Sepsis: +BC w/enterococcus, onvanomycin&improving.Plan for vanc  for 4 weeks after last negative culture.Etiology unclear, incision sites clean per VVS, CXR w/o active dz, no urine available to culture. ID following. TEE shows normal appearing AV prothesis, no vegetation. Clinically improved, remains on vancomycin.  3. ESRD- on MWF HD. K+ controlled. Next HD 05/20/18, use 4K bath.  4. Anemiaof CKD-Hgbin the 7's.Aranesp q Friday. tsat 35%, no Fe needed. Transfuse prn.  5. Secondary hyperparathyroidism- Corr ca 9.9, phos 5.4>6.4w/increased Velphoro dose (3/2), may need to increase further if phos remains elevated. Continue binders and VDRA.  6. HTN/volume- Blood pressurevariable. Looks euvolemic. Needs lowering of dry at d/c.  7. Nutrition- Alb 2.1, Renal diet w/fluid restrictions, protein supplements, Vit 8. Hx TAVR 9. CAD/HFrEF   Anice Paganini, PA-C 05/19/2019, 12:03 PM  [facility] Pager:  [phone]

## 2019-05-19 NOTE — Progress Notes (Signed)
[facility] PHYSICAL MEDICINE & REHABILITATION PROGRESS NOTE  Subjective/Complaints: Patient seen sitting up in bed this morning, working with therapies.  He states he slept fairly overnight due to a long day yesterday.  Discussed knee immobilizer and dressing changes with therapies.  ROS: Denies CP, shortness of breath, nausea, vomiting, diarrhea.  Objective: Vital Signs: Blood pressure (!) 150/58, pulse 61, temperature 98 F (36.7 C), resp. rate 16, height 5\' 10"  (1.778 m), weight 68.2 kg, SpO2 100 %. No results found. Recent Labs    05/18/19 0626 05/19/19 0013  WBC 6.3 6.5  HGB 7.6* 7.3*  HCT 24.3* 23.3*  PLT 102* 109*   Recent Labs    05/17/19 0218 05/18/19 0240  NA 137 138  K 3.4* 3.9  CL 97* 99  CO2 23 23  GLUCOSE 97 95  BUN 38* 46*  CREATININE 7.23* 8.65*  CALCIUM 8.2* 8.4*    Physical Exam: BP (!) 150/58   Pulse 61   Temp 98 F (36.7 C)   Resp 16   Ht 5\' 10"  (1.778 m)   Wt 68.2 kg   SpO2 100%   BMI 21.57 kg/m  Constitutional: No distress . Vital signs reviewed. HENT: Normocephalic.  Atraumatic. Eyes: EOMI. No discharge. Cardiovascular: No JVD. Respiratory: Normal effort.  No stridor. GI: Non-distended. Skin: Right BKA with central area of serosanguineous drainage. Psych: Normal mood.  Normal behavior. Musc: Right BKA Left lower extremity digits 2 and 3 amputations Neurologic: Alert Motor: Bilateral upper extremities: 4+/5 proximal distal Right lower extremity: Hip flexion 4 -/5 Left lower extremity: 4/5 proximal distal   Assessment/Plan: 1. Functional deficits secondary to right BKA which require 3+ hours per day of interdisciplinary therapy in a comprehensive inpatient rehab setting.  Physiatrist is providing close team supervision and 24 hour management of active medical problems listed below.  Physiatrist and rehab team continue to assess barriers to discharge/monitor patient progress toward functional and medical goals  Care  Tool:  Bathing              Bathing assist       Upper Body Dressing/Undressing Upper body dressing        Upper body assist      Lower Body Dressing/Undressing Lower body dressing            Lower body assist       Toileting Toileting    Toileting assist       Transfers Chair/bed transfer  Transfers assist           Locomotion Ambulation   Ambulation assist              Walk 10 feet activity   Assist           Walk 50 feet activity   Assist           Walk 150 feet activity   Assist           Walk 10 feet on uneven surface  activity   Assist           Wheelchair     Assist               Wheelchair 50 feet with 2 turns activity    Assist            Wheelchair 150 feet activity     Assist            Medical Problem List and Plan: 1.Decreased functional mobilitysecondary to right BKA 2/25/2021after  failed transmetatarsal amputation and recent right lower extremity bypass  Begin CIR evaluations 2. Antithrombotics: -DVT/anticoagulation:Subcutaneous heparin -antiplatelet therapy: Plavix 75 mg daily 3. Pain Management:Robaxin oxycodone as needed  Monitor with increased exertion 4. Mood:Ativan as needed -antipsychotic agents: N/A 5. Neuropsych: This patientiscapable of making decisions on hisown behalf. 6. Skin/Wound Care:Routine skin checks 7. Fluids/Electrolytes/Nutrition:Routine in and outs. 8. ID/enterococcal bacteremia. Follow-up infectious disease. Continue IV vancomycin x4 weekswith HD to be completed 06/10/2019  Level within normal limits on 3/8 9.  ESRD:Continue hemodialysis as directed 10. CAD/TAVRAugust 2020. Continue Plavix as well as amiodarone 200 mg daily as directed. 11. Chronic combined systolic and diastolic congestive heart failure. Monitor for any signs of fluid overload.Continue Coreg 3.125 mg twice daily Filed  Weights   05/18/19 1648  Weight: 68.2 kg   12. Acute on chronic anemia. Continue Aranesp.   Hemoglobin 7.3 on 3/9, labs with HD 13.Orthostasis. ProAmatine 10 mg Monday Wednesday Friday with hemodialysis.   Monitor with increased mobility 14. Hyperlipidemia. Lipitor  LOS: 1 days A FACE TO FACE EVALUATION WAS PERFORMED  Janylah Belgrave Lorie Phenix 05/19/2019, 8:34 AM

## 2019-05-19 NOTE — Progress Notes (Signed)
Physical Therapy Session Note  Patient Details  Name: Jose Garrett MRN: [id number] Date of Birth: 03-09-1951  Today's Date: 05/19/2019 PT Individual Time: 1300-1410 PT Individual Time Calculation (min): 70 min   Short Term Goals: Week 1:  PT Short Term Goal 1 (Week 1): Pt will perform bed mobility with supervision PT Short Term Goal 2 (Week 1): Pt will transfer bed<>chair with LRAD CGA PT Short Term Goal 3 (Week 1): Pt will ambulate 36ft with LRAD CGA  Skilled Therapeutic Interventions/Progress Updates:    Received pt sitting in WC, pt agreeable to therapy, and did not report R residual limb pain but reported feeling nauseous and fatigued from previous therapies today. Pt declined anti-nausea medication. Session focused on functional mobility/transfers, amputee education, LE strength, dynamic sitting/standing balance, and improved endurance with activity. Pt transported to gym in [facility] total assist for time management. Pt transferred squat<>pivot WC<>mat without AD min A and sit<>supine with CGA. Educated pt on importance of spending time lying in prone to avoid hip flexor contractures to prepare LE for prosthesis; pt verbalized understanding. Pt performed the following exercises lying on mat.  -Lower trunk rotation 3x30 second hold on L side  -Bilateral SKTC stretch 3x30 second hold. Pt reported decreased low back pain after stretching.  -Bilateral SLR 2x10 pt required verbal cues for technique and breathing  -Sidelying R hip abduction 2x8 with verbal/tactile cues for technique to avoid hip flexor compensation -Pt transferred sidelying<>prone and performed R LE prone hip extension 2x5 with verbal cues for technique. Attempted L LE prone hip extension however pt with increased difficulty and unable to perform.  -Prone L hamstrung curls 2x10 -Sidelying L hip abduction 2x8  -Sidelying L clamshells 2x10  Sidelying<>sit CGA and squat<>pivot mat<>WC without AD min A. Pt transported back to room in  [facility] total assist. Pt transferred squat<>pivot WC<>bed without AD min A. Concluded session with pt sitting in bed, needs within reach, and bed alarm on.   Therapy Documentation Precautions:  Precautions Precautions: Fall Precaution Comments: NWB RLE Required Braces or Orthoses: Knee Immobilizer - Right Knee Immobilizer - Right: On at all times Restrictions Weight Bearing Restrictions: Yes RLE Weight Bearing: Non weight bearing  Therapy/Group: Individual Therapy Alfonse Alpers PT, DPT   05/19/2019, 7:42 AM

## 2019-05-19 NOTE — IPOC Note (Signed)
Individualized overall Plan of Care [facility]) Patient Details Name: Jose Garrett MRN: [id number] DOB: 12-13-1950  Admitting Diagnosis: Right below-knee amputee [facility])  Hospital Problems: Principal Problem:   Right below-knee amputee [facility]) Active Problems:   Orthostatic hypotension   Acute on chronic anemia   Chronic combined systolic and diastolic congestive heart failure (HCC)   ESRD on dialysis ([place])   Bacteremia     Functional Problem List: Nursing Behavior, Bladder, Bowel, Edema, Medication Management, Pain, Safety, Skin Integrity  PT Balance, Endurance, Motor, Pain, Sensory  OT Balance, Endurance, Motor, Pain, Safety, Skin Integrity  SLP    TR         Basic ADL's: OT Grooming, Dressing, Bathing, Toileting     Advanced  ADL's: OT Simple Meal Preparation     Transfers: PT Bed Mobility, Bed to Chair, Car, Manufacturing systems engineer, Metallurgist: PT Ambulation, Emergency planning/management officer, Stairs     Additional Impairments: OT None  SLP        TR      Anticipated Outcomes Item Anticipated Outcome  Self Feeding    Swallowing      Basic self-care  Mod I  Toileting  Mod I   Bathroom Transfers Mod I, supervision shower  Bowel/Bladder  min assist  Transfers  Mod I with LRAD  Locomotion  Supervision with LRAD  Communication     Cognition     Pain  <4 on a 0-10 pain scale  Safety/Judgment  mod assist with safety   Therapy Plan: PT Intensity: Minimum of 1-2 x/day ,45 to 90 minutes PT Frequency: 5 out of 7 days PT Duration Estimated Length of Stay: 10-12 days OT Intensity: Minimum of 1-2 x/day, 45 to 90 minutes OT Frequency: 5 out of 7 days OT Duration/Estimated Length of Stay: 10-12 days      Team Interventions: Nursing Interventions Patient/Family Education, Skin Care/Wound Management, Bladder Management, Bowel Management, Disease Management/Prevention, Discharge Planning, Pain Management, Medication Management, Psychosocial Support  PT  interventions Ambulation/gait training, Discharge planning, Functional mobility training, Psychosocial support, Therapeutic Activities, Balance/vestibular training, Disease management/prevention, Neuromuscular re-education, Skin care/wound management, Therapeutic Exercise, Wheelchair propulsion/positioning, DME/adaptive equipment instruction, Pain management, Splinting/orthotics, UE/LE Strength taining/ROM, Community reintegration, Technical sales engineer stimulation, Barrister's clerk education, IT trainer, UE/LE Coordination activities  OT Interventions Training and development officer, Academic librarian, Discharge planning, Disease mangement/prevention, Engineer, drilling, Functional mobility training, Pain management, Patient/family education, Psychosocial support, Self Care/advanced ADL retraining, Skin care/wound managment, Splinting/orthotics, Therapeutic Activities, Therapeutic Exercise, UE/LE Strength taining/ROM  SLP Interventions    TR Interventions    SW/CM Interventions     Barriers to Discharge MD  Medical stability, Wound care, and Weight bearing restrictions  Nursing Weight bearing restrictions, Medication compliance, Lack of/limited family support, Wound Care, Inaccessible home environment, Decreased caregiver support, Medical stability    PT Home environment access/layout, Weight bearing restrictions 1 STE with 0 rails, WC not wide enough to fit in bathroom  OT Inaccessible home environment, Decreased caregiver support, Home environment access/layout bathroom is most likely not w/c accessible  SLP      SW       Team Discharge Planning: Destination: PT-Home ,OT- Home , SLP-  Projected Follow-up: PT-Home health PT, OT-  Home health OT, SLP-  Projected Equipment Needs: PT-To be determined, OT- Other (comment)(drop arm BSC), SLP-  Equipment Details: PT-Pt has RW, SPC, rollator, and manual WC, OT-  Patient/family involved in discharge planning: PT- Patient,   OT-Patient, SLP-   MD ELOS: 10-14 days. Medical  Rehab Prognosis:  Good Assessment: 69 year old right-handed male with history of CAD status post TAVR procedure August 2020 maintained on Plavix as well as amiodarone, chronic combined systolic and diastolic congestive heart failure.  End-stage renal disease with hemodialysis Monday Wednesday and Friday [facility], hypertension, hyperlipidemia, quit smoking 8 years ago.  Patient did receive inpatient rehab services 07/28/2018 to 08/13/2018 for debility related to PAD status post left common femoral to below-knee popliteal artery bypass and discharged to home contact-guard assist mobility.  Presented 04/29/2019 for gangrenous changes of the right foot as well as recent right lower extremity bypass and no change with conservative care undergoing right transmetatarsal amputation 04/29/2019 per Dr. Donzetta Matters.  Postoperatively with plantar flap becoming necrotic returning back to the OR for revision of right transmetatarsal amputation removal of necrotic tissue 05/04/2018 unfortunately with progressive gangrenous necrotic changes and ultimately underwent right BKA 05/06/2018 per Dr Trula Slade..  Hemodialysis ongoing as per renal services.  Patient doing well until 05/11/2019 isolated fever of 103 blood cultures grew Enterococcus placed on vancomycin x4 weeks through 06/10/2019.  Infectious disease consulted with TEE to evaluate. TAVR showed no vegetation and no vavular AI.[name]  Acute on chronic anemia monitored. Patient with resulting functional deficits with mobility, transfers, self-care.  Will set goals for Mod I with PT and Mod I/Supervision OT.   Due to the current state of emergency, patients may not be receiving their 3-hours of Medicare-mandated therapy.  See Team Conference Notes for weekly updates to the plan of care

## 2019-05-19 NOTE — Evaluation (Signed)
Occupational Therapy Assessment and Plan  Patient Details  Name: Jose Garrett MRN: [id number] Date of Birth: January 26, 1951  OT Diagnosis: acute pain, lumbago (low back pain) and muscle weakness (generalized) Rehab Potential: Rehab Potential (ACUTE ONLY): Good ELOS: 10-12 days   Today's Date: 05/19/2019 OT Individual Time: 7681-1572 OT Individual Time Calculation (min): 73 min     Problem List:  Patient Active Problem List   Diagnosis Date Noted  . Right below-knee amputee ([place]) 05/18/2019  . Pressure injury of skin 05/14/2019  . Enterococcal bacteremia 05/13/2019  . Polymorphic ventricular tachycardia ([place]) 10/24/2018  . S/P TAVR (transcatheter aortic valve replacement) 10/21/2018  . Anxiety   . Coronary artery disease involving native coronary artery of native heart without angina pectoris   . Thrombocytopenia ([place])   . PAD (peripheral artery disease) ([place]) 07/22/2018  . Acute on chronic combined systolic and diastolic CHF (congestive heart failure) ([place]) 11/30/2017  . ESRD (end stage renal disease) on dialysis ([place])   . Iron deficiency anemia 11/22/2015  . AL amyloidosis ([place]) 09/25/2014  . Anemia of chronic disease 09/25/2014  . Essential hypertension, benign 11/06/2013    Past Medical History:  Past Medical History:  Diagnosis Date  . Amyloidosis ([place])   . Anemia of chronic disease   . Anxiety   . CAD (coronary artery disease)    a. cardiac cath on 11/25/17 with occlusion of the RCA which could have been his event but the RCA could not be engaged. The RCA filled distally from left to right collaterals. There were no severe stenoses in the left coronary system. Medical therapy recommended, started on plavix.  . Chronic combined systolic and diastolic CHF (congestive heart failure) ([place])    a. EF dropped to 35-40% with grade 2 DD by echo 11/2017.  [name] DJD (degenerative joint disease), lumbar   . ESRD (end stage renal disease) ([place])    ESRD- HEMO MWF- [facility]  . GERD  (gastroesophageal reflux disease)   . Hyperlipidemia   . Hypertension   . Myocardial infarction ([place])   . Neuropathy   . Peripheral vascular disease ([place])   . Pneumonia   . Restless legs   . S/P TAVR (transcatheter aortic valve replacement) 10/21/2018   26 mm Edwards Sapien 3 Ultra transcatheter heart valve placed via percutaneous right transfemoral approach   . Severe aortic stenosis    severe   Past Surgical History:  Past Surgical History:  Procedure Laterality Date  . ABDOMINAL AORTOGRAM W/LOWER EXTREMITY Bilateral 07/16/2018   Procedure: ABDOMINAL AORTOGRAM W/LOWER EXTREMITY;  Surgeon: Wellington Hampshire, MD;  Location: [place] [facility];  Service: Cardiovascular;  Laterality: Bilateral;  . ABDOMINAL AORTOGRAM W/LOWER EXTREMITY N/A 04/09/2019   Procedure: ABDOMINAL AORTOGRAM W/LOWER EXTREMITY - Right Lower;  Surgeon: Waynetta Sandy, MD;  Location: [place] [facility];  Service: Cardiovascular;  Laterality: N/A;  . AMPUTATION Left 08/19/2018   Procedure: AMPUTATION LEFT TOES SECOND AND THIRD;  Surgeon: Waynetta Sandy, MD;  Location: [facility];  Service: Vascular;  Laterality: Left;  . AMPUTATION Right 05/07/2019   Procedure: AMPUTATION BELOW KNEE;  Surgeon: Serafina Mitchell, MD;  Location: [facility] OR;  Service: Vascular;  Laterality: Right;  . AV FISTULA PLACEMENT Right 12/27/2015   Procedure: Right Arm ARTERIOVENOUS (AV) FISTULA CREATION;  Surgeon: Angelia Mould, MD;  Location: [place];  Service: Vascular;  Laterality: Right;  . CATARACT EXTRACTION W/ INTRAOCULAR LENS  IMPLANT, BILATERAL    . COLONOSCOPY    . ERCP W/  SPHICTEROTOMY    . FEMORAL-POPLITEAL BYPASS GRAFT Left 07/22/2018   Procedure: left FEMORAL TO BELOW KNEE POPLITEAL ARTERY BYPASS GREAFT using 31m removable ring propaten gore graft;  Surgeon: CWaynetta Sandy MD;  [place] [facility]  Service: Vascular;  Laterality: Left;  . FEMORAL-POPLITEAL BYPASS GRAFT Right 04/10/2019   Procedure: BYPASS GRAFT  RIGHT FEMORAL-POPLITEAL ARTERY;  Surgeon: ERosetta Posner MD;  [place] [facility]  Service: Vascular;  Laterality: Right;  . I & D EXTREMITY Right 05/05/2019   Procedure: Revision of  RIGHT  FOOT Amputation;  Surgeon: CWaynetta Sandy MD;  [place] [facility]  Service: Vascular;  Laterality: Right;  . INTRAOPERATIVE TRANSTHORACIC ECHOCARDIOGRAM N/A 10/21/2018   Procedure: Intraoperative Transthoracic Echocardiogram;  Surgeon: CSherren Mocha MD;  [place]  Service: Open Heart Surgery;  Laterality: N/A;  . IR GENERIC HISTORICAL  01/27/2016   IR FLUORO GUIDE CV LINE RIGHT 01/27/2016 DArne Cleveland MD [facility]  . IR GENERIC HISTORICAL  01/27/2016   IR [place] ACCESS RIGHT 01/27/2016 DArne Cleveland MD [facility]  . LAPAROSCOPIC CHOLECYSTECTOMY  03/2011  . LEFT HEART CATH AND CORONARY ANGIOGRAPHY N/A 11/25/2017   Procedure: LEFT HEART CATH AND CORONARY ANGIOGRAPHY;  Surgeon: JMartinique Peter M, MD;  [facility];  Service: Cardiovascular;  Laterality: N/A;  . LEFT HEART CATH AND CORONARY ANGIOGRAPHY N/A 07/24/2018   Procedure: LEFT HEART CATH AND CORONARY ANGIOGRAPHY;  Surgeon: CSherren Mocha MD;  [facility];  Service: Cardiovascular;  Laterality: N/A;  . RENAL BIOPSY, PERCUTANEOUS Right 09/08/2014  . RIGHT/LEFT HEART CATH AND CORONARY ANGIOGRAPHY N/A 03/25/2018   Procedure: RIGHT/LEFT HEART CATH AND CORONARY ANGIOGRAPHY;  Surgeon: JMartinique Peter M, MD;  [facility];  Service: Cardiovascular;  Laterality: N/A;  . TEE WITHOUT CARDIOVERSION N/A 05/14/2019   Procedure: TRANSESOPHAGEAL ECHOCARDIOGRAM (TEE);  Surgeon: RFay Records MD;  [facility];  Service: Cardiovascular;  Laterality: N/A;  . TOE AMPUTATION Left 08/19/2018   2nd & 3rd toes  . TONSILLECTOMY  ~ 1960  . TRANSCATHETER AORTIC VALVE REPLACEMENT, TRANSFEMORAL N/A 10/21/2018   Procedure: TRANSCATHETER AORTIC VALVE REPLACEMENT, TRANSFEMORAL;  Surgeon: CSherren Mocha MD;  [place] [facility]  Service: Open Heart Surgery;  Laterality: N/A;  . TRANSMETATARSAL AMPUTATION Right 04/29/2019   Procedure: RIGHT TRANSMETATARSAL AMPUTATION;  Surgeon: CWaynetta Sandy MD;  [place] [facility]  Service: Vascular;  Laterality: Right;  . ULTRASOUND GUIDANCE FOR VASCULAR ACCESS  03/25/2018   Procedure: Ultrasound Guidance For Vascular Access;  Surgeon: JMartinique Peter M, MD;  [facility];  Service: Cardiovascular;;    Assessment & Plan Clinical Impression: Patient is a 69y.o. left-handed male with history of CADstatus post TAVR procedure August 2020 maintained on Plavix as well as amiodarone, chronic combined systolic and diastolic congestive heart failure. End-stage renal disease [facility], hyperlipidemia, quit smoking 8 years ago. Patient did receive inpatient rehab services 07/28/2018 to 08/13/2018 for debility related to PAD status post left common femoral to below-knee popliteal artery bypass and discharged to home contact-guard assist mobility. Presented 04/29/2019 for gangrenous changes of the right foot as well as recent right lower extremity bypass and no change with conservative care undergoing right transmetatarsal amputation 04/29/2019 per Dr. CDonzetta Matters Postoperatively with plantar flap becoming necrotic returning back to the OR for revision of right transmetatarsal amputation removal of necrotic tissue 05/04/2018 unfortunately with progressive gangrenous necrotic changes and ultimately underwent right BKA  05/06/2018 per Dr Trula Slade.. Hemodialysis ongoing as per renal services. Patient doing well until 05/11/2019 isolated fever of 103 blood cultures grew Enterococcus placed on vancomycinx4 weeks through 06/10/2019. Infectious disease consulted with TEE to evaluate TAVRshowed no vegetation and no vavular AI.[name] Acute on chronic anemia 7.7and monitored. Subcutaneous heparin for DVT  prophylaxis. Therapy evaluations completed and patient was admitted for a comprehensive rehab program.  Patient transferred to CIR on 05/18/2019 .    Patient currently requires mod with basic self-care skills secondary to muscle weakness and decreased sitting balance, decreased postural control and decreased balance strategies.  Prior to hospitalization, patient could complete ADLs and IADLs with modified independent .  Patient will benefit from skilled intervention to increase independence with basic self-care skills prior to discharge home with care partner.  Anticipate patient will require intermittent supervision and follow up home health.  OT - End of Session Activity Tolerance: Tolerates 30+ min activity without fatigue Endurance Deficit: No OT Assessment Rehab Potential (ACUTE ONLY): Good OT Barriers to Discharge: Inaccessible home environment;Decreased caregiver support;Home environment access/layout OT Barriers to Discharge Comments: bathroom is most likely not w/c accessible OT Patient demonstrates impairments in the following area(s): Balance;Endurance;Motor;Pain;Safety;Skin Integrity OT Basic ADL's Functional Problem(s): Grooming;Dressing;Bathing;Toileting OT Advanced ADL's Functional Problem(s): Simple Meal Preparation OT Transfers Functional Problem(s): Toilet;Tub/Shower OT Additional Impairment(s): None OT Plan OT Intensity: Minimum of 1-2 x/day, 45 to 90 minutes OT Frequency: 5 out of 7 days OT Duration/Estimated Length of Stay: 10-12 days OT Treatment/Interventions: Medical illustrator training;Community reintegration;Discharge planning;Disease mangement/prevention;DME/adaptive equipment instruction;Functional mobility training;Pain management;Patient/family education;Psychosocial support;Self Care/advanced ADL retraining;Skin care/wound managment;Splinting/orthotics;Therapeutic Activities;Therapeutic Exercise;UE/LE Strength taining/ROM OT Basic Self-Care Anticipated Outcome(s):  Mod I OT Toileting Anticipated Outcome(s): Mod I OT Bathroom Transfers Anticipated Outcome(s): Mod I, supervision shower OT Recommendation Patient destination: Home Follow Up Recommendations: Home health OT Equipment Recommended: Other (comment)(drop arm BSC)   Skilled Therapeutic Intervention OT eval completed with discussion of rehab process, OT purpose, POC, ELOS, and goals.  ADL assessment completed with focus on dynamic sitting balance, sit > stand, and functional transfers during self-care tasks of bathing and dressing.  Pt declined LB bathing, but completed LB dressing at bed level with lateral leans to pull underwear and pants up requiring up to min assist for stability. Completed lateral scoot transfer to w/c with min assist.  Engaged in UB bathing, dressing, and grooming at sink with setup assist for items.  Therapist rewrapped residual limb in figure 8 pattern while educating on proper wrapping technique and limb shaping.  Completed w/c mobility 150' with Supervision.  Pt remained upright in w/c with chair alarm on and all needs in reach.   OT Evaluation Precautions/Restrictions  Precautions Precautions: Fall Required Braces or Orthoses: Knee Immobilizer - Right Knee Immobilizer - Right: On at all times Restrictions Weight Bearing Restrictions: Yes RLE Weight Bearing: Non weight bearing Vital Signs Therapy Vitals Temp: 98 F (36.7 C) Pulse Rate: 61 Resp: 16 BP: (!) 150/58 Patient Position (if appropriate): Lying Oxygen Therapy SpO2: 100 % Pain Pain Assessment Pain Scale: 0-10 Pain Score: 6  Pain Type: Chronic pain Pain Location: Back Pain Orientation: Lower Pain Descriptors / Indicators: Spasm Home Living/Prior Functioning Home Living Family/patient expects to be discharged to:: Private residence Living Arrangements: Alone Available Help at Discharge: Family(daughter, [name], and his exwife, Linda 24/7) Type of Home: House Home Access: Stairs to enter [employer] of Steps: 1 Home Layout: One level Bathroom Shower/Tub: Research officer, [employer] Accessibility: No(pt reports very small bathroom, unsure if w/c could  fit inside) Additional Comments: has a tub transfer bench and elevated toilet seat  Lives With: Alone IADL History Homemaking Responsibilities: Yes Meal Prep Responsibility: Primary Laundry Responsibility: No Cleaning Responsibility: Primary Current License: Yes Prior Function Level of Independence: Independent with basic ADLs, Independent with homemaking with ambulation, Requires assistive device for independence Driving: No(daughter and exwife did majority of driving, he may drive occasionally if absolutely needed) ADL ADL Eating: Independent Where Assessed-Eating: Bed level Grooming: Setup Where Assessed-Grooming: Sitting at sink Upper Body Dressing: Setup Where Assessed-Upper Body Dressing: Bed level Lower Body Dressing: Minimal assistance Where Assessed-Lower Body Dressing: Edge of bed Toilet Transfer: Minimal assistance Toilet Transfer Method: Squat pivot Toilet Transfer Equipment: Drop arm bedside commode Vision Baseline Vision/History: Wears glasses Wears Glasses: At all times Patient Visual Report: No change from baseline Vision Assessment?: No apparent visual deficits Cognition Overall Cognitive Status: Within Functional Limits for tasks assessed Arousal/Alertness: Awake/alert Orientation Level: Person;Place;Situation Person: Oriented Place: Oriented Situation: Oriented Year: 2021 Month: March Day of Week: Correct Memory: Appears intact Immediate Memory Recall: Sock;Blue;Bed Memory Recall Sock: Without Cue Memory Recall Blue: Without Cue Memory Recall Bed: Without Cue Awareness: Appears intact Problem Solving: Appears intact Safety/Judgment: Appears intact Sensation Sensation Light Touch: Impaired by gross assessment(numbness in finger tips and lower Lt leg and foot) Proprioception: Appears  Intact Coordination Gross Motor Movements are Fluid and Coordinated: No Fine Motor Movements are Fluid and Coordinated: Yes Finger Nose Finger Test: slight intention tremor Rt > Lt Mobility  Transfers Sit to Stand: Moderate Assistance - Patient 50-74%  Extremity/Trunk Assessment RUE Assessment RUE Assessment: Within Functional Limits General Strength Comments: grossly 4/5 overall LUE Assessment LUE Assessment: Within Functional Limits General Strength Comments: grossly 4/5     Refer to Care Plan for Long Term Goals  Recommendations for other services: None    Discharge Criteria: Patient will be discharged from OT if patient refuses treatment 3 consecutive times without medical reason, if treatment goals not met, if there is a change in medical status, if patient makes no progress towards goals or if patient is discharged from hospital.  The above assessment, treatment plan, treatment alternatives and goals were discussed and mutually agreed upon: by patient  Simonne Come 05/19/2019, 8:27 AM

## 2019-05-19 NOTE — Progress Notes (Signed)
Inpatient Rehabilitation  Patient information reviewed and entered into eRehab system by Jaena Brocato M. Reshonda Koerber, M.A., CCC/SLP, PPS Coordinator.  Information including medical coding, functional ability and quality indicators will be reviewed and updated through discharge.    

## 2019-05-20 ENCOUNTER — Inpatient Hospital Stay (HOSPITAL_COMMUNITY): Payer: Medicare Other | Admitting: Physical Therapy

## 2019-05-20 ENCOUNTER — Inpatient Hospital Stay (HOSPITAL_COMMUNITY): Payer: Medicare Other | Admitting: Occupational Therapy

## 2019-05-20 ENCOUNTER — Inpatient Hospital Stay (HOSPITAL_COMMUNITY): Payer: Medicare Other

## 2019-05-20 LAB — RENAL FUNCTION PANEL
Albumin: 2.4 g/dL — ABNORMAL LOW (ref 3.5–5.0)
Anion gap: 15 (ref 5–15)
BUN: 32 mg/dL — ABNORMAL HIGH (ref 8–23)
CO2: 22 mmol/L (ref 22–32)
Calcium: 9.1 mg/dL (ref 8.9–10.3)
Chloride: 96 mmol/L — ABNORMAL LOW (ref 98–111)
Creatinine, Ser: 7.42 mg/dL — ABNORMAL HIGH (ref 0.61–1.24)
GFR calc Af Amer: 8 mL/min — ABNORMAL LOW (ref >60)
GFR calc non Af Amer: 7 mL/min — ABNORMAL LOW (ref >60)
Glucose, Bld: 102 mg/dL — ABNORMAL HIGH (ref 70–99)
Phosphorus: 5.5 mg/dL — ABNORMAL HIGH (ref 2.5–4.6)
Potassium: 4.5 mmol/L (ref 3.5–5.1)
Sodium: 133 mmol/L — ABNORMAL LOW (ref 135–145)

## 2019-05-20 LAB — CBC
HCT: 24.9 % — ABNORMAL LOW (ref 39.0–52.0)
Hemoglobin: 7.7 g/dL — ABNORMAL LOW (ref 13.0–17.0)
MCH: 32 pg (ref 26.0–34.0)
MCHC: 30.9 g/dL (ref 30.0–36.0)
MCV: 103.3 fL — ABNORMAL HIGH (ref 80.0–100.0)
Platelets: 119 10*3/uL — ABNORMAL LOW (ref 150–400)
RBC: 2.41 MIL/uL — ABNORMAL LOW (ref 4.22–5.81)
RDW: 14.6 % (ref 11.5–15.5)
WBC: 5.8 10*3/uL (ref 4.0–10.5)
nRBC: 0 % (ref 0.0–0.2)

## 2019-05-20 MED ORDER — CALCITRIOL 0.5 MCG PO CAPS
ORAL_CAPSULE | ORAL | Status: AC
  Administered 2019-05-20: 1.5 ug via ORAL
  Filled 2019-05-20: qty 3

## 2019-05-20 MED ORDER — MIDODRINE HCL 5 MG PO TABS
ORAL_TABLET | ORAL | Status: AC
  Administered 2019-05-20: 10 mg via ORAL
  Filled 2019-05-20: qty 2

## 2019-05-20 MED ORDER — VANCOMYCIN HCL IN DEXTROSE 750-5 MG/150ML-% IV SOLN
INTRAVENOUS | Status: AC
  Administered 2019-05-20: 750 mg via INTRAVENOUS
  Filled 2019-05-20: qty 150

## 2019-05-20 NOTE — Progress Notes (Signed)
Occupational Therapy Session Note  Patient Details  Name: Jose Garrett MRN: [id number] Date of Birth: [birth year]  Today's Date: 05/20/2019 OT Individual Time: 2010-0712 OT Individual Time Calculation (min): 57 min    Short Term Goals: Week 1:  OT Short Term Goal 1 (Week 1): Pt will complete toilet transfers with supervision OT Short Term Goal 2 (Week 1): Pt will complete LB dressing at sit > stand level with supervision OT Short Term Goal 3 (Week 1): Pt will complete toileting tasks with supervision  Skilled Therapeutic Interventions/Progress Updates:    Treatment session with focus on functional transfers and self-care retraining.  Pt received supine in bed asleep but easily aroused and agreeable to therapy session.  Completed bed mobility CGA and setup for bathing at shower level with therapist covering IV site and residual limb.  Pt completed bed > w/c > tub bench in room shower with Min assist.  Min cues for technique and setup with transfer to tub bench.  Pt completed bathing with overall CGA during lateral leans when washing LB and buttocks.  Completed dressing with lateral leans for underwear and sit > partial stand to allow therapist to pull pants over hips.  Pt donned button up shirt with increased time, pt reports difficulty with buttons due to numbness in fingertips.  Pt remained upright in w/c with chair alarm on and all needs in reach.  Therapy Documentation Precautions:  Precautions Precautions: Fall Precaution Comments: NWB RLE Required Braces or Orthoses: Knee Immobilizer - Right Knee Immobilizer - Right: On at all times Restrictions Weight Bearing Restrictions: Yes RLE Weight Bearing: Non weight bearing Pain:  Pt with no c/o pain   Therapy/Group: Individual Therapy  Simonne Come 05/20/2019, 11:24 AM

## 2019-05-20 NOTE — Progress Notes (Signed)
Physical Therapy Note  Patient Details  Name: Jose Garrett MRN: [id number] Date of Birth: [birth year] Today's Date: 05/20/2019  Pt off unit for hemodialysis, will attempt to make up time as schedule allows. 75 minutes missed of skilled physical therapy due to HD.    [place], DPT   05/20/2019, 2:57 PM

## 2019-05-20 NOTE — Progress Notes (Addendum)
[facility] Progress Note   Subjective:   Pt seen in room. Reports he finds dialysis to be draining and has discomfort due to back pain, so does not always complete his full treatment. Continues to have intermittent nausea but good appetite. Denies SOB, CP, palpitations, abdominal pain.  Objective Vitals:   05/19/19 0439 05/19/19 1448 05/19/19 1940 05/20/19 0532  BP: (!) 150/58 (!) 128/55 (!) 150/70 (!) 170/57  Pulse: 61 65 67 65  Resp: 16 18 18 18   Temp: 98 F (36.7 C) 97.7 F (36.5 C) (!) 97.5 F (36.4 C) 98 F (36.7 C)  TempSrc:   Oral Oral  SpO2: 100% 100% 100% 98%  Weight:    71.8 kg  Height:       Physical Exam General: Well developed male, alert and in nAD Heart: RRR, no murmurs, rubs or gallops  Lungs: CTA bilaterally without wheezing, rhonchi or rales Abdomen: Soft, non-tender, non-distended. +BS Extremities: R BKA, LLE no edema Dialysis Access: RUE AVF + bruit  Additional Objective Labs: Basic Metabolic Panel: Recent Labs  Lab 05/16/19 1142 05/17/19 0218 05/18/19 0240  NA 138 137 138  K 3.8 3.4* 3.9  CL 100 97* 99  CO2 22 23 23   GLUCOSE 79 97 95  BUN 29* 38* 46*  CREATININE 6.20* 7.23* 8.65*  CALCIUM 8.2* 8.2* 8.4*  PHOS 4.8* 5.4* 6.4*   Liver Function Tests: Recent Labs  Lab 05/16/19 1142 05/17/19 0218 05/18/19 0240  ALBUMIN 2.1* 2.1* 2.1*   CBC: Recent Labs  Lab 05/15/19 0217 05/18/19 0626 05/19/19 0013  WBC 6.6 6.3 6.5  HGB 7.7* 7.6* 7.3*  HCT 24.5* 24.3* 23.3*  MCV 102.1* 102.1* 102.2*  PLT 110* 102* 109*   Blood Culture    Component Value Date/Time   SDES BLOOD LEFT ANTECUBITAL 05/14/2019 0522   SPECREQUEST AEROBIC BOTTLE ONLY Blood Culture adequate volume 05/14/2019 0522   CULT  05/14/2019 0522    NO GROWTH 5 DAYS Performed at [facility], [place] [address] [place], [place] [address]    REPTSTATUS 05/19/2019 FINAL 05/14/2019 0522   Medications: . vancomycin     . amiodarone  200 mg Oral Daily  .  atorvastatin  80 mg Oral Q supper  . calcitRIOL  1.5 mcg Oral Q M,W,F-HD  . carvedilol  3.125 mg Oral BID  . Chlorhexidine Gluconate Cloth  6 each Topical Q0600  . clopidogrel  75 mg Oral Q supper  . [START ON 05/22/2019] darbepoetin (ARANESP) injection - DIALYSIS  100 mcg Intravenous Q Fri-HD  . docusate sodium  100 mg Oral Daily  . heparin  5,000 Units Subcutaneous Q8H  . midodrine  10 mg Oral Q M,W,F-HD  . multivitamin  1 tablet Oral QHS  . pantoprazole  40 mg Oral Daily  . sucroferric oxyhydroxide  1,500 mg Oral TID WC  . vitamin B-12  1,000 mcg Oral QPM    Dialysis Orders: MWF [facility] 4h 400/800 76.5kg 2/2 bath P4 RUE AVF Hep 2400 - Parsabiv 5mg  IV q HD - Calcitriol 1.68mcg PO q HD - Mircera 81mcg IV q 2 weeks (last given 86mcg on 1/20, Hgb 10.3 on 2/10)  Assessment/Plan: 1. PAD w/ gangrenous changes -s/p R TMA on 2/1 healing poorly so s/p R BKA on 2/25. Now on CIR. 2. Sepsis: +BC w/enterococcus, onvanomycin&improving.Plan for vanc for 4 weeks after last negative culture which was 05/14/19.Etiology unclear, incision sites clean per VVS, CXR w/o active dz, no urine available to culture. ID following. TEE  shows normal appearing AV prothesis, no vegetation. Clinically improved, remains on vancomycin.  3. ESRD- onMWF HD. K+ controlled. Next HD today, use 4K bath.  4. Anemiaof CKD-Hgbin the 7's.Aranesp q Friday. tsat 35%, no Fe needed. Transfuse prn. Recheck with HD today.  5. Secondary hyperparathyroidism- Corr ca 9.9, phos 5.4>6.4w/increased Velphoro dose (3/2), may need to increase further if phos remains elevated. Continue binders and VDRA.  6. HTN/volume- Blood pressure elevated today, expect improvement after HD.Appears euvolemic. Needs lowering ofdry at d/c.  7. Nutrition- Alb 2.1, Renal diet w/fluid restrictions, protein supplements, Vit 8. Hx TAVR 9. CAD/HFrEF : euvolemic  Anice Paganini, PA-C 05/20/2019, 12:05 PM  [facility] Pager: [phone]  Pt seen, examined and agree w A/P as above.  [name]  MD 05/20/2019, 3:23 PM

## 2019-05-20 NOTE — Progress Notes (Signed)
[facility] PHYSICAL MEDICINE & REHABILITATION PROGRESS NOTE  Subjective/Complaints: Patient seen sitting up in his chair, working with therapy this morning.  He is states he did not sleep well overnight and request a sleep aid.  He notes he had a good first day of therapies yesterday.  ROS: Denies CP, shortness of breath, nausea, vomiting, diarrhea.  Objective: Vital Signs: Blood pressure (!) 170/57, pulse 65, temperature 98 F (36.7 C), temperature source Oral, resp. rate 18, height 5\' 10"  (1.778 m), weight 71.8 kg, SpO2 98 %. No results found. Recent Labs    05/18/19 0626 05/19/19 0013  WBC 6.3 6.5  HGB 7.6* 7.3*  HCT 24.3* 23.3*  PLT 102* 109*   Recent Labs    05/18/19 0240  NA 138  K 3.9  CL 99  CO2 23  GLUCOSE 95  BUN 46*  CREATININE 8.65*  CALCIUM 8.4*    Physical Exam: BP (!) 170/57 (BP Location: Left Arm)   Pulse 65   Temp 98 F (36.7 C) (Oral)   Resp 18   Ht 5\' 10"  (1.778 m)   Wt 71.8 kg   SpO2 98%   BMI 22.71 kg/m  Constitutional: No distress . Vital signs reviewed. HENT: Normocephalic.  Atraumatic. Eyes: EOMI. No discharge. Cardiovascular: No JVD. Respiratory: Normal effort.  No stridor. GI: Non-distended. Skin: Right BKA with dressing C/D/I Psych: Normal mood.  Normal behavior. Musc: Left lower extremity digits 2 and 3 with amputations. Neurologic: Alert Motor: Bilateral upper extremities: 4+/5 proximal distal Right lower extremity: Hip flexion 4/5 Left lower extremity: 4/5 proximal distal, improving  Assessment/Plan: 1. Functional deficits secondary to right BKA which require 3+ hours per day of interdisciplinary therapy in a comprehensive inpatient rehab setting.  Physiatrist is providing close team supervision and 24 hour management of active medical problems listed below.  Physiatrist and rehab team continue to assess barriers to discharge/monitor patient progress toward functional and medical goals  Care Tool:  Bathing    Body  parts bathed by patient: Right arm, Left arm, Chest, Abdomen(declined LB)         Bathing assist       Upper Body Dressing/Undressing Upper body dressing   What is the patient wearing?: Pull over shirt    Upper body assist Assist Level: Set up assist    Lower Body Dressing/Undressing Lower body dressing      What is the patient wearing?: Underwear/pull up, Pants     Lower body assist Assist for lower body dressing: Minimal Assistance - Patient > 75%     Toileting Toileting    Toileting assist Assist for toileting: Minimal Assistance - Patient > 75% Assistive Device Comment: bsc   Transfers Chair/bed transfer  Transfers assist     Chair/bed transfer assist level: Minimal Assistance - Patient > 75%     Locomotion Ambulation   Ambulation assist      Assist level: 2 helpers Assistive device: Walker-rolling Max distance: 64ft   Walk 10 feet activity   Assist     Assist level: 2 helpers Assistive device: Walker-rolling   Walk 50 feet activity   Assist Walk 50 feet with 2 turns activity did not occur: Safety/medical concerns(fatigue, balance/postural control, R BKA)         Walk 150 feet activity   Assist Walk 150 feet activity did not occur: Safety/medical concerns(fatigue, balance/postural control, R BKA)         Walk 10 feet on uneven surface  activity   Assist Walk 10  feet on uneven surfaces activity did not occur: Safety/medical concerns(fatigue, balance/postural control, R BKA)         Wheelchair     Assist Will patient use wheelchair at discharge?: Yes Type of Wheelchair: Manual    Wheelchair assist level: Supervision/Verbal cueing Max wheelchair distance: 127ft    Wheelchair 50 feet with 2 turns activity    Assist        Assist Level: Supervision/Verbal cueing   Wheelchair 150 feet activity     Assist     Assist Level: Supervision/Verbal cueing      Medical Problem List and  Plan: 1.Decreased functional mobilitysecondary to right BKA 2/25/2021after failed transmetatarsal amputation and recent right lower extremity bypass  Continue CIR  Team conference today to discuss current and goals and coordination of care, home and environmental barriers, and discharge planning with nursing, case manager, and therapies.  2. Antithrombotics: -DVT/anticoagulation:Subcutaneous heparin -antiplatelet therapy: Plavix 75 mg daily 3. Pain Management:Robaxin oxycodone as needed  Controlled with medications on 3/10  Monitor with increased exertion 4. Mood:Ativan as needed -antipsychotic agents: N/A 5. Neuropsych: This patientiscapable of making decisions on hisown behalf. 6. Skin/Wound Care:Routine skin checks 7. Fluids/Electrolytes/Nutrition:Routine in and outs. 8. ID/enterococcal bacteremia. Follow-up infectious disease. Continue IV vancomycin x4 weekswith HD to be completed 06/10/2019  Level within normal limits on 3/8 9.  ESRD:Continue hemodialysis as directed 10. CAD/TAVRAugust 2020. Continue Plavix as well as amiodarone 200 mg daily as directed. 11. Chronic combined systolic and diastolic congestive heart failure. Monitor for any signs of fluid overload.Continue Coreg 3.125 mg twice daily Filed Weights   05/18/19 1648 05/20/19 0532  Weight: 68.2 kg 71.8 kg   Trending up on 3/10, recs per nephro 12. Acute on chronic anemia. Continue Aranesp.   Hemoglobin 7.2 on 3/9, labs with HD 13.Orthostasis. ProAmatine 10 mg Monday Wednesday Friday with hemodialysis.   Elevated on 3/10  Monitor with increased mobility 14. Hyperlipidemia. Lipitor  LOS: 2 days A FACE TO FACE EVALUATION WAS PERFORMED  Denham Mose Lorie Phenix 05/20/2019, 9:16 AM

## 2019-05-20 NOTE — Progress Notes (Signed)
Physical Therapy Note  Patient Details  Name: Jose Garrett MRN: [id number] Date of Birth: [birth year] Today's Date: 05/20/2019    Attempted to work with patient for afternoon session- he had left for HD and was not available to participate. Will attempt to return if therapist and patient schedules allow.  30 minutes of skilled therapy time missed due to HD procedure.   Windell Norfolk, DPT, PN1   Supplemental Physical Therapist [facility]    Pager [phone] Acute Rehab Office [phone]    05/20/2019, 12:54 PM

## 2019-05-20 NOTE — Progress Notes (Signed)
Physical Therapy Session Note  Patient Details  Name: Jose Garrett MRN: [id number] Date of Birth: 04/18/1950  Today's Date: 05/20/2019 PT Individual Time: 1116-1202 PT Individual Time Calculation (min): 46 min   Short Term Goals: Week 1:  PT Short Term Goal 1 (Week 1): Pt will perform bed mobility with supervision PT Short Term Goal 2 (Week 1): Pt will transfer bed<>chair with LRAD CGA PT Short Term Goal 3 (Week 1): Pt will ambulate 46f with LRAD CGA  Skilled Therapeutic Interventions/Progress Updates:    [facility] and motivated to work with therapy this session. Able to independently self-propel WC >15107fwith BUEs without difficulty. Continues to require MinA for transfers to and from mat table- on first attempt performed traditional stand-pivot with MinA without device, on second attempt wanted to perform lateral scoot but ended up impulsively performing partial squat and reaching across himself, requiring PT intervention to maintain balance and promote safer mechanics for lateral transfers. Spent time working on appropriate exercises for new BKA, including quad sets, SLRs, sidelying hip ABD, prone and supine hip extension, and long arc quads; also incorporated HS stretches at side of mat table. Tolerated gait training approximately 2528fith RW and MinA for balance, cues for improved coordination and L LE lift off, also cues to reduce vertical jumping/improve progression of L LE. Fatigued at [facility] [place] was left in WC [facility] Path alarm active and RN present and attending, all other needs met this morning.   Therapy Documentation Precautions:  Precautions Precautions: Fall Precaution Comments: NWB RLE Required Braces or Orthoses: Knee Immobilizer - Right Knee Immobilizer - Right: On at all times Restrictions Weight Bearing Restrictions: Yes RLE Weight Bearing: Non weight bearing   Pain: Pain Assessment Pain Scale: 0-10 Pain Score: 3  Pain Type: Phantom  pain Pain Location: Leg Pain Orientation: Right Pain Descriptors / Indicators: Aching;Discomfort Pain Onset: On-going Patients Stated Pain Goal: 0 Pain Intervention(s): Ambulation/increased activity;Environmental changes Multiple Pain Sites: No    Therapy/Group: Individual Therapy   KriWindell NorfolkPT, PN1   Supplemental Physical Therapist [place] Pager [phone] Rehab Office [phone] 05/20/2019, 12:02 PM

## 2019-05-20 NOTE — Patient Care Conference (Signed)
Inpatient RehabilitationTeam Conference and Plan of Care Update Date: 05/20/2019   Time: 11:50 AM    Patient Name: Jose Garrett      Medical Record Number: [id number]  Date of Birth: Aug 06, 1950 Sex: Male         Room/Bed: 4M08C/4M08C-01 Payor Info: Payor: [employer] MEDICARE / Plan: [facility] MEDICARE / Product Type: *No Product type* /    Admit Date/Time:  05/18/2019  4:18 PM  Primary Diagnosis:  Right below-knee amputee [facility])  Patient Active Problem List   Diagnosis Date Noted  . Orthostatic hypotension   . Acute on chronic anemia   . Chronic combined systolic and diastolic congestive heart failure ([place])   . ESRD on dialysis ([place])   . Bacteremia   . Right below-knee amputee ([place]) 05/18/2019  . Pressure injury of skin 05/14/2019  . Enterococcal bacteremia 05/13/2019  . Polymorphic ventricular tachycardia ([place]) 10/24/2018  . S/P TAVR (transcatheter aortic valve replacement) 10/21/2018  . Anxiety   . Coronary artery disease involving native coronary artery of native heart without angina pectoris   . Thrombocytopenia ([place])   . PAD (peripheral artery disease) ([place]) 07/22/2018  . Acute on chronic combined systolic and diastolic CHF (congestive heart failure) ([place]) 11/30/2017  . ESRD (end stage renal disease) on dialysis ([place])   . Iron deficiency anemia 11/22/2015  . AL amyloidosis ([place]) 09/25/2014  . Anemia of chronic disease 09/25/2014  . Essential hypertension, benign 11/06/2013    Expected Discharge Date: Expected Discharge Date: 05/28/19  Team Members Present: Physician leading conference: Dr. Delice Lesch Care Coodinator Present: Nestor Lewandowsky, RN, BSN, CRRN;Genie Flint Hakeem, RN, MSN Nurse Present: Ellison Carwin, LPN PT Present: Becky Sax, PT OT Present: Simonne Come, OT SLP Present: Colon Flattery, SLP PPS Coordinator present : Gunnar Fusi, SLP     Current Status/Progress Goal Weekly Team Focus  Bowel/Bladder   pr is cont lbm of bladder and anuric lbm 05/18/2019  To  remain cont.  Assess bowel q shoft and as needed. Medicate prn   Swallow/Nutrition/ Hydration             ADL's   Min assist LB dressing, Min assist transfers, Min-mod assist sit > stand  Mod I overall, Supervision shower transfer  ADL retraining, functional transfers, dynamic standing balance, education on limb care   Mobility   bed mobility CGA, squat<>pivot transfers min A, gait 73ft with RW min A +2 for safety, car transfer mod A, WC mobility 137ft supervision  Mod I transfers, supervision gait  functional mobility/transfers, UE/LE strength, dynamic standing balance, ambulation, stair navigation, endurance, amputee education   Communication             Safety/Cognition/ Behavioral Observations            Pain   pt complains of pain level 7/10  to reduce pain to 3/10  assess pain q shift and prn.    Skin   or has stage two wound on left lower buttock. bruising on lower abdomen, wound from stent on right upper  thigh, BKA   reduce infection to skin and remain free from wounds  assess skon prn and q shift.    Rehab Goals Patient on target to meet rehab goals: Yes *See Care Plan and progress notes for long and short-term goals.     Barriers to Discharge  Current Status/Progress Possible Resolutions Date Resolved   Nursing  Weight bearing restrictions;Medication compliance;Lack of/limited family support;Wound Care;Inaccessible home environment;Decreased caregiver support;Medical stability  PT  Home environment access/layout;Weight bearing restrictions  1 STE with 0 rails, WC not wide enough to fit in bathroom              OT Inaccessible home environment;Decreased caregiver support;Home environment access/layout  bathroom is most likely not w/c accessible             SLP                SW Inaccessible home environment;Home environment access/layout;Decreased caregiver support;Wound Care 1 step entry to home, no ramp Patient reports his daughter is looking for other  housing for them that is handicapped accessible          Discharge Planning/Teaching Needs:  Home alone with daughter and friend assisting as needed  TBD   Team Discussion: Bacteremia, IV abx, daily weights, monitoring labs, orthostasis ok.  RN pain R BKA, oxy, HD today, stage 2 sacrum, BM 3/8.  OT showered today, transfers min/mod, LB D CGA, mod I goals, needs S for shower.  PT CGA/S bed, transfers min A, amb 22' min A, w/c 150' S, mod I transfer goals, S gait goals.   Revisions to Treatment Plan: N/A     Medical Summary Current Status: Decreased functional mobility secondary to right BKA 05/07/2019 after failed transmetatarsal amputation and recent right lower extremity bypass Weekly Focus/Goal: Improve mobility, BP, anemia, chronic CHF, bacteremia  Barriers to Discharge: Medical stability;Weight bearing restrictions;IV antibiotics;Wound care   Possible Resolutions to Barriers: Therapies, follow BP, follow labs, folllow weights, optimize pain meds   Continued Need for Acute Rehabilitation Level of Care: The patient requires daily medical management by a physician with specialized training in physical medicine and rehabilitation for the following reasons: Direction of a multidisciplinary physical rehabilitation program to maximize functional independence : Yes Medical management of patient stability for increased activity during participation in an intensive rehabilitation regime.: Yes Analysis of laboratory values and/or radiology reports with any subsequent need for medication adjustment and/or medical intervention. : Yes   I attest that I was present, lead the team conference, and concur with the assessment and plan of the team.   [name] 05/20/2019, 8:43 PM   Team conference was held via web/ teleconference due to [place] - 19

## 2019-05-21 ENCOUNTER — Inpatient Hospital Stay (HOSPITAL_COMMUNITY): Payer: Medicare Other

## 2019-05-21 ENCOUNTER — Inpatient Hospital Stay (HOSPITAL_COMMUNITY): Payer: Medicare Other | Admitting: Occupational Therapy

## 2019-05-21 DIAGNOSIS — R110 Nausea: Secondary | ICD-10-CM

## 2019-05-21 DIAGNOSIS — D696 Thrombocytopenia, unspecified: Secondary | ICD-10-CM

## 2019-05-21 DIAGNOSIS — G479 Sleep disorder, unspecified: Secondary | ICD-10-CM

## 2019-05-21 LAB — CBC
HCT: 26 % — ABNORMAL LOW (ref 39.0–52.0)
Hemoglobin: 8.3 g/dL — ABNORMAL LOW (ref 13.0–17.0)
MCH: 32.4 pg (ref 26.0–34.0)
MCHC: 31.9 g/dL (ref 30.0–36.0)
MCV: 101.6 fL — ABNORMAL HIGH (ref 80.0–100.0)
Platelets: 125 10*3/uL — ABNORMAL LOW (ref 150–400)
RBC: 2.56 MIL/uL — ABNORMAL LOW (ref 4.22–5.81)
RDW: 14.6 % (ref 11.5–15.5)
WBC: 5.9 10*3/uL (ref 4.0–10.5)
nRBC: 0 % (ref 0.0–0.2)

## 2019-05-21 MED ORDER — NON FORMULARY: 1.5000 mg | Freq: Every day | Status: DC

## 2019-05-21 MED ORDER — MELATONIN 3 MG PO TABS
1.5000 mg | ORAL_TABLET | Freq: Every day | ORAL | Status: DC
  Administered 2019-05-23 – 2019-05-26 (×4): 1.5 mg via ORAL
  Filled 2019-05-21 (×2): qty 0.5
  Filled 2019-05-21: qty 1
  Filled 2019-05-21: qty 0.5
  Filled 2019-05-21: qty 1
  Filled 2019-05-21 (×4): qty 0.5

## 2019-05-21 MED ORDER — CHLORHEXIDINE GLUCONATE CLOTH 2 % EX PADS
6.0000 | MEDICATED_PAD | Freq: Every day | CUTANEOUS | Status: DC
  Administered 2019-05-23 – 2019-05-27 (×2): 6 via TOPICAL

## 2019-05-21 NOTE — Progress Notes (Signed)
Jose Garrett PHYSICAL MEDICINE & REHABILITATION PROGRESS NOTE  Subjective/Complaints: Patient seen sitting up in bed this morning.  He states he slept fairly overnight.  He notes intermittent transient nausea, no vomiting.  He was seen by nephrology yesterday, notes reviewed-monitoring hyperparathyroidism.  ROS: Denies CP, shortness of breath, nausea, vomiting, diarrhea.  Objective: Vital Signs: Blood pressure 111/88, pulse 63, temperature 98.1 F (36.7 C), temperature source Oral, resp. rate 18, height 5\' 10"  (1.778 m), weight 69.3 kg, SpO2 98 %. No results found. Recent Labs    05/20/19 1445 05/21/19 0608  WBC 5.8 5.9  HGB 7.7* 8.3*  HCT 24.9* 26.0*  PLT 119* 125*   Recent Labs    05/20/19 1445 05/21/19 0608  NA 133* 133*  K 4.5 3.9  CL 96* 96*  CO2 22 28  GLUCOSE 102* 104*  BUN 32* 14  CREATININE 7.42* 4.00*  CALCIUM 9.1 8.4*    Physical Exam: BP 111/88 (BP Location: Left Arm)   Pulse 63   Temp 98.1 F (36.7 C) (Oral)   Resp 18   Ht 5\' 10"  (1.778 m)   Wt 69.3 kg   SpO2 98%   BMI 21.91 kg/m  Constitutional: No distress . Vital signs reviewed. HENT: Normocephalic.  Atraumatic. Eyes: EOMI. No discharge. Cardiovascular: No JVD. Respiratory: Normal effort.  No stridor. GI: Non-distended. Skin: Warm and dry.  Intact. Psych: Right BKA with dressing C/D/I Musc: Left lower extremity digits 2,3 amputations Neurologic: Alert Motor: Bilateral upper extremities: 4+/5 proximal distal Right lower extremity: Hip flexion 4/5 Left lower extremity: 4/5 proximal distal, stable  Assessment/Plan: 1. Functional deficits secondary to right BKA which require 3+ hours per day of interdisciplinary therapy in a comprehensive inpatient rehab setting.  Physiatrist is providing close team supervision and 24 hour management of active medical problems listed below.  Physiatrist and rehab team continue to assess barriers to discharge/monitor patient progress toward functional and  medical goals  Care Tool:  Bathing    Body parts bathed by patient: Right arm, Left arm, Chest, Abdomen, Front perineal area, Buttocks, Right upper leg, Left upper leg, Face     Body parts n/a: Right lower leg   Bathing assist Assist Level: Contact Guard/Touching assist     Upper Body Dressing/Undressing Upper body dressing   What is the patient wearing?: Button up shirt    Upper body assist Assist Level: Set up assist    Lower Body Dressing/Undressing Lower body dressing      What is the patient wearing?: Underwear/pull up, Pants     Lower body assist Assist for lower body dressing: Minimal Assistance - Patient > 75%     Toileting Toileting    Toileting assist Assist for toileting: Minimal Assistance - Patient > 75% Assistive Device Comment: bsc   Transfers Chair/bed transfer  Transfers assist     Chair/bed transfer assist level: Minimal Assistance - Patient > 75%     Locomotion Ambulation   Ambulation assist      Assist level: Minimal Assistance - Patient > 75% Assistive device: Walker-rolling Max distance: 69ft   Walk 10 feet activity   Assist     Assist level: Minimal Assistance - Patient > 75% Assistive device: Walker-rolling   Walk 50 feet activity   Assist Walk 50 feet with 2 turns activity did not occur: Safety/medical concerns(fatigue, balance/postural control, R BKA)         Walk 150 feet activity   Assist Walk 150 feet activity did not occur: Safety/medical concerns(fatigue, balance/postural  control, R BKA)         Walk 10 feet on uneven surface  activity   Assist Walk 10 feet on uneven surfaces activity did not occur: Safety/medical concerns(fatigue, balance/postural control, R BKA)         Wheelchair     Assist Will patient use wheelchair at discharge?: Yes Type of Wheelchair: Manual    Wheelchair assist level: Set up assist Max wheelchair distance: 164ft+    Wheelchair 50 feet with 2 turns  activity    Assist        Assist Level: Set up assist   Wheelchair 150 feet activity     Assist     Assist Level: Set up assist      Medical Problem List and Plan: 1.Decreased functional mobilitysecondary to right BKA 2/25/2021after failed transmetatarsal amputation and recent right lower extremity bypass  Continue CIR 2. Antithrombotics: -DVT/anticoagulation:Subcutaneous heparin -antiplatelet therapy: Plavix 75 mg daily 3. Pain Management:Robaxin oxycodone as needed  Controlled with medications on 3/11  Monitor with increased exertion 4. Mood:Ativan as needed -antipsychotic agents: N/A 5. Neuropsych: This patientiscapable of making decisions on hisown behalf. 6. Skin/Wound Care:Routine skin checks 7. Fluids/Electrolytes/Nutrition:Routine in and outs. 8. ID/enterococcal bacteremia. Follow-up infectious disease. Continue IV vancomycin x4 weekswith HD to be completed 06/10/2019  Level within normal limits on 3/8 9.  ESRD:Continue hemodialysis as directed 10. CAD/TAVRAugust 2020. Continue Plavix as well as amiodarone 200 mg daily as directed. 11. Chronic combined systolic and diastolic congestive heart failure. Monitor for any signs of fluid overload.Continue Coreg 3.125 mg twice daily Filed Weights   05/20/19 1242 05/20/19 1704 05/21/19 0442  Weight: 70.5 kg 69 kg 69.3 kg   Stable on 3/11  12. Acute on chronic anemia. Continue Aranesp.   Hemoglobin 8.3 on 3/11  Labs with HD 13.Orthostasis. ProAmatine 10 mg Monday Wednesday Friday with hemodialysis.   Blood pressure labile with HD  Monitor with increased mobility 14. Hyperlipidemia. Lipitor 15.  Thrombocytopenia  Platelets 125 on 2/11  Continue to monitor 16.  Sleep disturbance  Melatonin started on 3/11 17.  Intermittent transient nausea-likely multifactorial: Pain meds +/- IV antibiotics +/- bacteremia +/- HD  Continue to monitor   LOS: 3 days A FACE  TO FACE EVALUATION WAS PERFORMED  Jose Garrett Lorie Phenix 05/21/2019, 8:27 AM

## 2019-05-21 NOTE — Progress Notes (Signed)
Physical Therapy Session Note  Patient Details  Name: Jose Garrett MRN: [id number] Date of Birth: 1950-09-04  Today's Date: 05/21/2019 PT Individual Time: 1100-1156 and 1345-1454 PT Individual Time Calculation (min): 56 min and 69 min  Short Term Goals: Week 1:  PT Short Term Goal 1 (Week 1): Pt will perform bed mobility with supervision PT Short Term Goal 2 (Week 1): Pt will transfer bed<>chair with LRAD CGA PT Short Term Goal 3 (Week 1): Pt will ambulate 78ft with LRAD CGA  Skilled Therapeutic Interventions/Progress Updates:   Treatment Session 1: 1100-1156 56 min Received pt supine in bed, pt agreeable to therapy, and reported 4/10 pain in R residual limb. Repositioning and distraction done to reduce pain. Session focused on functional mobility/transfers, LE/UE strength, dynamic standing balance/coordination, ambulation, amputee education, and improved endurance with activity. Pt performed bed mobility with supervision. Pt transferred bed<>WC without AD lateral scoot min A. Pt performed WC mobility 140ft using bilateral UEs and supervision to therapy gym. Pt transferred lateral scoot WC<>mat min A with cues for WC parts management. While seated on mat pt performed the following exercises with supervision:  -Trunk rotation with 4x4lb medicine ball 2x10 -Chest press with 4.4lb medicine ball 2x9 -Bicep curls with 3lb dumbbells 2x10 -Overhead chest press with 3lb dumbbells 2x6 on L UE only due to discomfort on R UE from fistula -Seated bilateral marching 1x12   Pt performed standing horseshoe toss with UE support on RW CGA x 3 trials. Pt required rest breaks throughout session due to increased fatigue. Pt ambulated 63ft with RW min A. Pt required cues for "hopping" technique and RW safety. Pt reported 7/10 fatigue after activity. Pt able to set up transfer and perform WC parts management to transfer to mat with supervision for set up and min A for lateral scoot transfer. P transported back to  room in [facility] total assist. Concluded session with pt supine in bed, needs within reach, and bed alarm on. Therapist provided pt with drink.   Treatment Session 2: 5093-2671 24 min Received pt supine in bed, pt agreeable to therapy, and reported pain but did not state number; pt declined pain medication. Session focused on functional mobility/transfers, LE/UE strength, dynamic standing balance/coordination, amputee education, and improved endurance with activity. Pt performed bed mobility with supervision. Pt transferred bed<>WC via lateral scoot with min A. Pt performed WC mobility 144ft using bilateral UEs and supervision to rehab apartment. Pt performed ambulatory furniture transfer onto recliner using RW min A. Pt required mod A to stand from recliner due to low surface. Pt transported to dayroom total assist. Pt transferred lateral scoot WC<>Nustep min A. Pt performed bilateral UE and L LE strengthening on Nustep at workload 3 for 9 minutes for a total of 169 steps. Pt performed L LE strengthening on Kinetron at 30 cm/sec with therapist providing manual counter resistance 1 minute x 3 reps with verbal cues for technique. Pt transferred sit<>stand with RW min A. While standing pt performed R LE hip flexion 2x10, R LE hip abduction 1x10, and 1x10 R hip extension with bilateral UE support on RW CGA. Pt reported 7/10 fatigue after activity. Pt transported back to room in [facility] total assist. Pt performed lateral scoot transfer WC<>bed min A. Concluded session with pt supine in bed, needs within reach, and bed alarm on. Therapist provided drink for pt.   Therapy Documentation Precautions:  Precautions Precautions: Fall Precaution Comments: NWB RLE Required Braces or Orthoses: Knee Immobilizer - Right Knee Immobilizer - Right:  On at all times Restrictions Weight Bearing Restrictions: Yes RLE Weight Bearing: Non weight bearing  Therapy/Group: Individual Therapy Alfonse Alpers PT, DPT    05/21/2019, 7:38 AM

## 2019-05-21 NOTE — Progress Notes (Signed)
Occupational Therapy Session Note  Patient Details  Name: Jose Garrett MRN: [id number] Date of Birth: 03/29/1950  Today's Date: 05/21/2019 OT Individual Time: 8115-7262 OT Individual Time Calculation (min): 59 min    Short Term Goals: Week 1:  OT Short Term Goal 1 (Week 1): Pt will complete toilet transfers with supervision OT Short Term Goal 2 (Week 1): Pt will complete LB dressing at sit > stand level with supervision OT Short Term Goal 3 (Week 1): Pt will complete toileting tasks with supervision  Skilled Therapeutic Interventions/Progress Updates:    Patient in bed, alert and ready for therapy session.  Supine to sitting with CS.  Sit to stand and SPT with RW to w/c with CGA.   He is able to propel w/c to/from therapy gym.  Completed UB ergometer x 10 minutes.  Completed w/c push ups.  Sit to stand CGA - stand tolerance 2 x 3 minutes - practiced single UB support and weight shift.  Provided inspection mirror and post amputation guidebook.  Rewrapped residual limb and reviewed positioning/limb care.  Patient remained sitting in w/c at close of session, seat alarm set and call bell/tray table in reach.    Therapy Documentation Precautions:  Precautions Precautions: Fall Precaution Comments: NWB RLE Required Braces or Orthoses: Knee Immobilizer - Right Knee Immobilizer - Right: On at all times Restrictions Weight Bearing Restrictions: Yes RLE Weight Bearing: Non weight bearing General:   Vital Signs: Therapy Vitals Temp: 98.1 F (36.7 C) Temp Source: Oral Pulse Rate: 63 Resp: 18 BP: 111/88 Patient Position (if appropriate): Lying Oxygen Therapy SpO2: 98 % O2 Device: Room Air   Therapy/Group: Individual Therapy  Jose Garrett 05/21/2019, 7:41 AM

## 2019-05-21 NOTE — Progress Notes (Signed)
[facility] Progress Note   Subjective:   Pt seen in room. Tired after therapy this AM. Still c/o intermittent nausea but improving. No other concerns, denies abdominal pain, N/V/D.  Objective Vitals:   05/20/19 1530 05/20/19 1704 05/20/19 1928 05/21/19 0442  BP: 122/61 (!) 148/67 (!) 157/44 111/88  Pulse: 70 71 72 63  Resp:   18 18  Temp:  97.8 F (36.6 C) 97.8 F (36.6 C) 98.1 F (36.7 C)  TempSrc:  Oral Oral Oral  SpO2:  99% 98% 98%  Weight:  69 kg  69.3 kg  Height:       Physical Exam General: Well developed male, alert and in NAD Heart: RRR, no murmurs, rubs or gallops  Lungs: CTA bilaterally without wheezing, rhonchi or rales Abdomen: Soft, non-tender, non-distended. +BS Extremities: R BKA, LLE no edema Dialysis Access: RUE AVF + bruit  Additional Objective Labs: Basic Metabolic Panel: Recent Labs  Lab 05/18/19 0240 05/20/19 1445 05/21/19 0608  NA 138 133* 133*  K 3.9 4.5 3.9  CL 99 96* 96*  CO2 23 22 28   GLUCOSE 95 102* 104*  BUN 46* 32* 14  CREATININE 8.65* 7.42* 4.00*  CALCIUM 8.4* 9.1 8.4*  PHOS 6.4* 5.5* 3.8   Liver Function Tests: Recent Labs  Lab 05/18/19 0240 05/20/19 1445 05/21/19 0608  ALBUMIN 2.1* 2.4* 2.5*   No results for input(s): LIPASE, AMYLASE in the last 168 hours. CBC: Recent Labs  Lab 05/15/19 0217 05/15/19 0217 05/18/19 0626 05/18/19 0626 05/19/19 0013 05/20/19 1445 05/21/19 0608  WBC 6.6   < > 6.3   < > 6.5 5.8 5.9  HGB 7.7*   < > 7.6*   < > 7.3* 7.7* 8.3*  HCT 24.5*   < > 24.3*   < > 23.3* 24.9* 26.0*  MCV 102.1*  --  102.1*  --  102.2* 103.3* 101.6*  PLT 110*   < > 102*   < > 109* 119* 125*   < > = values in this interval not displayed.   Blood Culture    Component Value Date/Time   SDES BLOOD LEFT ANTECUBITAL 05/14/2019 0522   SPECREQUEST AEROBIC BOTTLE ONLY Blood Culture adequate volume 05/14/2019 0522   CULT  05/14/2019 0522    NO GROWTH 5 DAYS Performed at [facility], [place]  [address] [place], [place] [address]    REPTSTATUS 05/19/2019 FINAL 05/14/2019 0522    Cardiac Enzymes: No results for input(s): CKTOTAL, CKMB, CKMBINDEX, TROPONINI in the last 168 hours. CBG: No results for input(s): GLUCAP in the last 168 hours. Iron Studies: No results for input(s): IRON, TIBC, TRANSFERRIN, FERRITIN in the last 72 hours. @lablastinr3 @ Studies/Results: No results found. Medications: . vancomycin 750 mg (05/20/19 1618)   . amiodarone  200 mg Oral Daily  . atorvastatin  80 mg Oral Q supper  . calcitRIOL  1.5 mcg Oral Q M,W,F-HD  . carvedilol  3.125 mg Oral BID  . Chlorhexidine Gluconate Cloth  6 each Topical Q0600  . clopidogrel  75 mg Oral Q supper  . [START ON 05/22/2019] darbepoetin (ARANESP) injection - DIALYSIS  100 mcg Intravenous Q Fri-HD  . docusate sodium  100 mg Oral Daily  . heparin  5,000 Units Subcutaneous Q8H  . Melatonin  1.5 mg Oral QHS  . midodrine  10 mg Oral Q M,W,F-HD  . multivitamin  1 tablet Oral QHS  . pantoprazole  40 mg Oral Daily  . sucroferric oxyhydroxide  1,500 mg Oral TID WC  .  vitamin B-12  1,000 mcg Oral QPM    Dialysis Orders: MWF [facility] 4h 400/800 76.5kg 2/2 bath P4 RUE AVF Hep 2400 - Parsabiv 5mg  IV q HD - Calcitriol 1.21mcg PO q HD - Mircera 69mcg IV q 2 weeks (last given 38mcg on 1/20, Hgb 10.3 on 2/10)  Assessment/Plan: 1. PAD w/ gangrenous changes -s/p R TMA on 2/1 healing poorly so s/p R BKA on 2/25. Now on CIR. 2. Sepsis: +BC w/enterococcus, onvanomycin&improving.Plan for vanc for 4 weeks after last negative culture which was 05/14/19.Etiology unclear, incision sites clean per VVS, CXR w/o active dz, no urine available to culture. ID following. TEE shows normal appearing AV prothesis, no vegetation.Clinically improved, remains on vancomycin. 3. ESRD- onMWF HD.K+ controlled. Next HD 3/12, use 3K bath. 4. Anemiaof CKD-Hgbin the 7's > 8.3 today.Aranesp q Friday. tsat 35%, no Fe needed. Transfuse  prn. R 5. Secondary hyperparathyroidism- Calcium controlled, phos now at goalw/increased Velphoro dose (3/2). Continue binders and VDRA.  6. HTN/volume- Blood pressure controlled. Euvolemic on exam  7. Nutrition- Alb 2.5, Renal diet w/fluid restrictions, protein supplements, Vit 8. Hx TAVR 9. CAD/HFrEF : euvolemic  Anice Paganini, PA-C 05/21/2019, 11:24 AM  [facility] Pager: [phone]

## 2019-05-21 NOTE — Progress Notes (Signed)
Team Conference Report to Patient/Family  Team Conference discussion was reviewed with the patient , including goals, any changes in plan of care and target discharge date.  Patient expressed understanding and is in agreement.  The patient has a target discharge date of 05/28/19.  Jose Garrett B 05/21/2019, 2:11 PM

## 2019-05-21 NOTE — Progress Notes (Signed)
Progress Note    05/21/2019 3:25 PM Right below knee amputation  Subjective:  No complaints. States that he just got back from therapy and "they worked me hard"   Vitals:   05/20/19 1928 05/21/19 0442  BP: (!) 157/44 111/88  Pulse: 72 63  Resp: 18 18  Temp: 97.8 F (36.6 C) 98.1 F (36.7 C)  SpO2: 98% 98%   Physical Exam: General: Well appearing, well nourished, resting comfortably in bed Lungs:  Non labored Incisions:  Right lower extremity incisions clean, dry and intact. Non tender. No hematoma. No ecchymosis Extremities:  2+ femoral pulses, right BKA staples present, anterolateral staple line with some seroanguinous drainage, no increased drainage on compression. Does not appear to have any fluid collections, no hematoma. Clean and dry dressings re applied Neurologic: Alert and oriented  CBC    Component Value Date/Time   WBC 5.9 05/21/2019 0608   RBC 2.56 (L) 05/21/2019 0608   HGB 8.3 (L) 05/21/2019 0608   HGB 11.4 (L) 11/12/2018 1637   HGB 10.8 (L) 03/14/2017 0921   HCT 26.0 (L) 05/21/2019 0608   HCT 35.0 (L) 11/12/2018 1637   HCT 32.5 (L) 03/14/2017 0921   PLT 125 (L) 05/21/2019 0608   PLT 51 (LL) 11/12/2018 1637   MCV 101.6 (H) 05/21/2019 0608   MCV 103 (H) 11/12/2018 1637   MCV 106.2 (H) 03/14/2017 0921   MCH 32.4 05/21/2019 0608   MCHC 31.9 05/21/2019 0608   RDW 14.6 05/21/2019 0608   RDW 14.6 11/12/2018 1637   RDW 14.6 03/14/2017 0921   LYMPHSABS 0.6 (L) 05/12/2019 0256   LYMPHSABS 1.0 11/12/2018 1637   LYMPHSABS 0.8 (L) 03/14/2017 0921   MONOABS 0.7 05/12/2019 0256   MONOABS 0.6 03/14/2017 0921   EOSABS 0.1 05/12/2019 0256   EOSABS 0.2 11/12/2018 1637   BASOSABS 0.1 05/12/2019 0256   BASOSABS 0.0 11/12/2018 1637   BASOSABS 0.0 03/14/2017 0921    BMET    Component Value Date/Time   NA 133 (L) 05/21/2019 0608   NA 135 07/14/2018 0909   NA 141 03/14/2017 0921   K 3.9 05/21/2019 0608   K 4.1 03/14/2017 0921   CL 96 (L) 05/21/2019 0608   CO2 28 05/21/2019 0608   CO2 33 (H) 03/14/2017 0921   GLUCOSE 104 (H) 05/21/2019 0608   GLUCOSE 144 (H) 03/14/2017 0921   BUN 14 05/21/2019 0608   BUN 76 (HH) 07/14/2018 0909   BUN 30.1 (H) 03/14/2017 0921   CREATININE 4.00 (H) 05/21/2019 0608   CREATININE 9.16 (HH) 09/17/2017 0954   CREATININE 8.1 (HH) 03/14/2017 0921   CALCIUM 8.4 (L) 05/21/2019 0608   CALCIUM 9.5 03/14/2017 0921   GFRNONAA 15 (L) 05/21/2019 0608   GFRNONAA 5 (L) 09/17/2017 0954   GFRNONAA 24 (L) 06/30/2014 1528   GFRAA 17 (L) 05/21/2019 0608   GFRAA 6 (L) 09/17/2017 0954   GFRAA 28 (L) 06/30/2014 1528    INR    Component Value Date/Time   INR 1.2 04/29/2019 0713   INR 1.10 (L) 01/21/2015 0809     Intake/Output Summary (Last 24 hours) at 05/21/2019 1525 Last data filed at 05/21/2019 1315 Gross per 24 hour  Intake 660 ml  Output 1504 ml  Net -844 ml     Assessment/Plan:  69 y.o. male is s/p right BKA. Pt in CIR doing well. Continue Vancomycin dosing in HD. Continue to monitor BKA stump for adequate healing. Staples to be removed next week if patient is still in  CIR   DVT prophylaxis:  Sq Heparin   Karoline Caldwell, PA-C [facility] [phone] 05/21/2019 3:25 PM

## 2019-05-22 ENCOUNTER — Inpatient Hospital Stay (HOSPITAL_COMMUNITY): Payer: Medicare Other

## 2019-05-22 ENCOUNTER — Inpatient Hospital Stay (HOSPITAL_COMMUNITY): Payer: Medicare Other | Admitting: Occupational Therapy

## 2019-05-22 LAB — CBC
HCT: 23.6 % — ABNORMAL LOW (ref 39.0–52.0)
Hemoglobin: 7.3 g/dL — ABNORMAL LOW (ref 13.0–17.0)
MCH: 32 pg (ref 26.0–34.0)
MCHC: 30.9 g/dL (ref 30.0–36.0)
MCV: 103.5 fL — ABNORMAL HIGH (ref 80.0–100.0)
Platelets: 108 10*3/uL — ABNORMAL LOW (ref 150–400)
RBC: 2.28 MIL/uL — ABNORMAL LOW (ref 4.22–5.81)
RDW: 14.7 % (ref 11.5–15.5)
WBC: 5.2 10*3/uL (ref 4.0–10.5)
nRBC: 0 % (ref 0.0–0.2)

## 2019-05-22 LAB — VANCOMYCIN, RANDOM: Vancomycin Rm: 18

## 2019-05-22 LAB — RENAL FUNCTION PANEL
Albumin: 2.5 g/dL — ABNORMAL LOW (ref 3.5–5.0)
Albumin: 2.5 g/dL — ABNORMAL LOW (ref 3.5–5.0)
Anion gap: 9 (ref 5–15)
Anion gap: 15 (ref 5–15)
BUN: 14 mg/dL (ref 8–23)
BUN: 29 mg/dL — ABNORMAL HIGH (ref 8–23)
CO2: 28 mmol/L (ref 22–32)
CO2: 24 mmol/L (ref 22–32)
Calcium: 8.4 mg/dL — ABNORMAL LOW (ref 8.9–10.3)
Calcium: 8.8 mg/dL — ABNORMAL LOW (ref 8.9–10.3)
Chloride: 96 mmol/L — ABNORMAL LOW (ref 98–111)
Chloride: 94 mmol/L — ABNORMAL LOW (ref 98–111)
Creatinine, Ser: 4 mg/dL — ABNORMAL HIGH (ref 0.61–1.24)
Creatinine, Ser: 6.56 mg/dL — ABNORMAL HIGH (ref 0.61–1.24)
GFR calc Af Amer: 17 mL/min — ABNORMAL LOW (ref >60)
GFR calc Af Amer: 9 mL/min — ABNORMAL LOW (ref >60)
GFR calc non Af Amer: 15 mL/min — ABNORMAL LOW (ref >60)
GFR calc non Af Amer: 8 mL/min — ABNORMAL LOW (ref >60)
Glucose, Bld: 104 mg/dL — ABNORMAL HIGH (ref 70–99)
Glucose, Bld: 109 mg/dL — ABNORMAL HIGH (ref 70–99)
Phosphorus: 3.8 mg/dL (ref 2.5–4.6)
Phosphorus: 5.4 mg/dL — ABNORMAL HIGH (ref 2.5–4.6)
Potassium: 3.9 mmol/L (ref 3.5–5.1)
Potassium: 4.4 mmol/L (ref 3.5–5.1)
Sodium: 133 mmol/L — ABNORMAL LOW (ref 135–145)
Sodium: 133 mmol/L — ABNORMAL LOW (ref 135–145)

## 2019-05-22 MED ORDER — VANCOMYCIN HCL IN DEXTROSE 750-5 MG/150ML-% IV SOLN
INTRAVENOUS | Status: AC
  Filled 2019-05-22: qty 150

## 2019-05-22 MED ORDER — CALCITRIOL 0.5 MCG PO CAPS
ORAL_CAPSULE | ORAL | Status: AC
  Filled 2019-05-22: qty 3

## 2019-05-22 MED ORDER — DARBEPOETIN ALFA 100 MCG/0.5ML IJ SOSY
PREFILLED_SYRINGE | INTRAMUSCULAR | Status: AC
  Filled 2019-05-22: qty 0.5

## 2019-05-22 MED ORDER — OXYCODONE HCL 5 MG PO TABS
ORAL_TABLET | ORAL | Status: AC
  Filled 2019-05-22: qty 2

## 2019-05-22 NOTE — Progress Notes (Signed)
Occupational Therapy Session Note  Patient Details  Name: Jose Garrett MRN: [id number] Date of Birth: [birth year]  Today's Date: 05/22/2019 OT Individual Time: 7253-6644 OT Individual Time Calculation (min): 70 min    Short Term Goals: Week 1:  OT Short Term Goal 1 (Week 1): Pt will complete toilet transfers with supervision OT Short Term Goal 2 (Week 1): Pt will complete LB dressing at sit > stand level with supervision OT Short Term Goal 3 (Week 1): Pt will complete toileting tasks with supervision  Skilled Therapeutic Interventions/Progress Updates:    Treatment session with focus on functional transfers, BUE strengthening, and dynamic standing balance. Pt received semi-reclined in bed reporting fatigue but agreeable to therapy session.  Pt declined bathing/dressing.  Completed lateral scoot transfer to w/c with CGA.  Pt propelled w/c to ADL apt for BUE strengthening.  Educated on tub/shower transfers with tub bench.  Pt able to complete transfer with short distance ambulation with RW with CGA, as home bathroom is not w/c accessible. Discussed modifications to shower to increase safety and independence.  Engaged in 1 set of 10 tricep pushups on pushup blocks, pt then only able to complete 4 of next set of 10 due to BUE fatigue.  Engaged in sit > stand with CGA. Engaged in dynamic standing task at table top with focus on increased standing tolerance to simulate grooming and meal prep tasks.  Pt able to stand 4 and then 7 mins.  Pt pleased with increased standing tolerance - as he is hopeful to complete aspects of meal prep in standing.  Discussed residual limb care/dressing with pt and MD.  Returned to room and pt remained upright in w/c with chair alarm on and all needs in reach.  Therapy Documentation Precautions:  Precautions Precautions: Fall Precaution Comments: NWB RLE Required Braces or Orthoses: Knee Immobilizer - Right Knee Immobilizer - Right: On at all times Restrictions Weight  Bearing Restrictions: Yes RLE Weight Bearing: Non weight bearing General:   Vital Signs: Therapy Vitals Temp: 97.7 F (36.5 C) Temp Source: Oral Pulse Rate: 63 Resp: 16 BP: (!) 128/53 Patient Position (if appropriate): Lying Oxygen Therapy SpO2: 96 % O2 Device: Room Air Pain:  Pt with no c/o pain   Therapy/Group: Individual Therapy  Simonne Come 05/22/2019, 8:59 AM

## 2019-05-22 NOTE — Progress Notes (Signed)
Physical Therapy Session Note  Patient Details  Name: Jose Garrett MRN: [id number] Date of Birth: 16-Jul-1950  Today's Date: 05/22/2019 PT Individual Time: 1100-1153 PT Individual Time Calculation (min): 53 min   Short Term Goals: Week 1:  PT Short Term Goal 1 (Week 1): Pt will perform bed mobility with supervision PT Short Term Goal 2 (Week 1): Pt will transfer bed<>chair with LRAD CGA PT Short Term Goal 3 (Week 1): Pt will ambulate 36ft with LRAD CGA  Skilled Therapeutic Interventions/Progress Updates:   Received pt supine in bed, pt agreeable to therapy, and did not state pain level during session, but reported fatigue from previous therapies this morning. Session focused on functional mobility/transfers, UE/LE strength, dynamic standing balance, amputee education, and improved activity tolerance. Pt performed bed mobility independently. Pt transferred bed<>WC lateral scoot without AD CGA. Pt performed WC mobility 114ft using bilateral UE and supervision to therapy gym. Pt transferred WC<>mat squat<>pivot without AD min A and sit<>supine with supervision. Pt performed the following exercises on mat with verbal cues for technique:  -Supine SLR 2x6 bilaterally  -Supine bridges with bolster under R LE for support 2x8 -Prone R hip extension and active assisted L hip extension 2x6 -Prone R hip flexor stretch with R LE on wedge to intensify stretch -Prone press up onto elbows with R LE on wedge to increase hip flexor stretch 1x6 reps -Seated alternating marching x20 bilaterally  -Seated knee extension x10 bilaterally   Pt required multiple rest breaks throughout session due to increased fatigue and reporting "I'm out of gas" at end of session. Pt transferred mat<>WC via lateral scoot CGA. Pt transported back to room in [facility] total assist. Concluded session with pt supine in bed, needs within reach, and bed alarm on. Therapist provided snack for pt.   Therapy Documentation Precautions:   Precautions Precautions: Fall Precaution Comments: NWB RLE Required Braces or Orthoses: Knee Immobilizer - Right Knee Immobilizer - Right: On at all times Restrictions Weight Bearing Restrictions: Yes RLE Weight Bearing: Non weight bearing  Therapy/Group: Individual Therapy Alfonse Alpers PT, DPT   05/22/2019, 7:44 AM

## 2019-05-22 NOTE — Progress Notes (Signed)
Patient transported to Hemodialysis via bed.

## 2019-05-22 NOTE — Progress Notes (Signed)
Pharmacy Antibiotic Note  Jose Garrett is a 69 y.o. male admitted on 05/18/2019 with Enterococcal bacteremia.  Pharmacy has been consulted for vancomycin dosing. Patient's enterococcus is Amp-S but switching to Vanc for ease of dosing as an outpatient. He is noted to have healing at site of amputation. WBC are wnl at 6.6 (last on 3/5). He is afebrile. ID is following and recommends 4 weeks of treatment through March 31st.  The patient's Vancomycin level today was therapeutic at 18 mcg/ml. The patient continues on a MWF HD schedule, Vancomycin doses remain appropriate at this time.   Microbiology results:  3/1 BCx: e. Faecalis R to gent, sensitive to ampicillin and vancomycin  3/4 BCx: ngtd   Plan: - Continue Vancomycin 750 mg/HD on MWF - Plan 4 weeks of therapy through 3/31 - Will plan on weekly levels on Friday - Will continue to follow HD schedule/duration, culture results, LOT, and antibiotic de-escalation plans   Height: 5\' 10"  (177.8 cm) Weight: 152 lb 11.2 oz (69.3 kg) IBW/kg (Calculated) : 73  Temp (24hrs), Avg:97.8 F (36.6 C), Min:97.6 F (36.4 C), Max:98 F (36.7 C)  Recent Labs  Lab 05/16/19 1142 05/17/19 0218 05/18/19 0240 05/18/19 0626 05/19/19 0013 05/20/19 1445 05/21/19 0608 05/22/19 0552  WBC  --   --   --  6.3 6.5 5.8 5.9  --   CREATININE 6.20* 7.23* 8.65*  --   --  7.42* 4.00*  --   VANCORANDOM  --   --  27  --   --   --   --  18    Estimated Creatinine Clearance: 17.6 mL/min (A) (by C-G formula based on SCr of 4 mg/dL (H)).    No Known Allergies  Vanc 3/1>>3/2>> (3/31) - 3/8 VR: 27 mcg/ml - 500 mg x 1 s/p HD then back to 750 mg/HD-MWF Zosyn 3/1>>3/2 Amp 3/2>>3/4  3/1 BCx: enterococcus faec- sens to amp 3/4 blood x2- ngtd  Thank you for allowing pharmacy to be a part of this patient's care.  Alycia Rossetti, PharmD, BCPS Clinical Pharmacist Clinical phone for 05/22/2019: [phone] 05/22/2019 9:57 AM   **Pharmacist phone directory can now be  found on [email or web address] ([email or web address]).  Listed under [place].

## 2019-05-22 NOTE — Progress Notes (Signed)
[facility] Progress Note   Subjective: seen prior to HD. No C/Os.      Objective Vitals:   05/21/19 1526 05/21/19 2003 05/22/19 0514 05/22/19 1231  BP: 133/65 134/62 (!) 128/53 (!) 154/65  Pulse: 64 69 63 64  Resp: 18 17 16 16   Temp: 97.6 F (36.4 C) 98 F (36.7 C) 97.7 F (36.5 C) 97.9 F (36.6 C)  TempSrc: Oral Oral Oral Oral  SpO2: 100% 100% 96% 100%  Weight:    70.6 kg  Height:       Physical Exam General: Pleasant older male in NAD Heart: S1,S2 RRR Lungs: CTAB A/P Abdomen: Active BS, S, NT Extremities: R BKA ace wrap no LLE edema Dialysis Access: R AVF + bruit   Additional Objective Labs: Basic Metabolic Panel: Recent Labs  Lab 05/18/19 0240 05/20/19 1445 05/21/19 0608  NA 138 133* 133*  K 3.9 4.5 3.9  CL 99 96* 96*  CO2 23 22 28   GLUCOSE 95 102* 104*  BUN 46* 32* 14  CREATININE 8.65* 7.42* 4.00*  CALCIUM 8.4* 9.1 8.4*  PHOS 6.4* 5.5* 3.8   Liver Function Tests: Recent Labs  Lab 05/18/19 0240 05/20/19 1445 05/21/19 0608  ALBUMIN 2.1* 2.4* 2.5*   No results for input(s): LIPASE, AMYLASE in the last 168 hours. CBC: Recent Labs  Lab 05/18/19 0626 05/18/19 0626 05/19/19 0013 05/20/19 1445 05/21/19 0608  WBC 6.3   < > 6.5 5.8 5.9  HGB 7.6*   < > 7.3* 7.7* 8.3*  HCT 24.3*   < > 23.3* 24.9* 26.0*  MCV 102.1*  --  102.2* 103.3* 101.6*  PLT 102*   < > 109* 119* 125*   < > = values in this interval not displayed.   Blood Culture    Component Value Date/Time   SDES BLOOD LEFT ANTECUBITAL 05/14/2019 0522   SPECREQUEST AEROBIC BOTTLE ONLY Blood Culture adequate volume 05/14/2019 0522   CULT  05/14/2019 0522    NO GROWTH 5 DAYS Performed at [facility], [place] [address] [place], [place] [address]    REPTSTATUS 05/19/2019 FINAL 05/14/2019 0522    Cardiac Enzymes: No results for input(s): CKTOTAL, CKMB, CKMBINDEX, TROPONINI in the last 168 hours. CBG: No results for input(s): GLUCAP in the last 168 hours. Iron  Studies: No results for input(s): IRON, TIBC, TRANSFERRIN, FERRITIN in the last 72 hours. @lablastinr3 @ Studies/Results: No results found. Medications: . vancomycin 750 mg (05/20/19 1618)   . amiodarone  200 mg Oral Daily  . atorvastatin  80 mg Oral Q supper  . calcitRIOL  1.5 mcg Oral Q M,W,F-HD  . carvedilol  3.125 mg Oral BID  . Chlorhexidine Gluconate Cloth  6 each Topical Q0600  . clopidogrel  75 mg Oral Q supper  . darbepoetin (ARANESP) injection - DIALYSIS  100 mcg Intravenous Q Fri-HD  . docusate sodium  100 mg Oral Daily  . heparin  5,000 Units Subcutaneous Q8H  . Melatonin  1.5 mg Oral QHS  . midodrine  10 mg Oral Q M,W,F-HD  . multivitamin  1 tablet Oral QHS  . pantoprazole  40 mg Oral Daily  . sucroferric oxyhydroxide  1,500 mg Oral TID WC  . vitamin B-12  1,000 mcg Oral QPM     Dialysis Orders: MWF [facility] 4h 400/800 76.5kg 2/2 bath P4 RUE AVF  -Heparin  2400 units IV TIW - Parsabiv 5mg  IV q HD - Calcitriol 1.41mcg PO q HD - Mircera 56mcg IV q 2 weeks (last  given 63mcg on 1/20, Hgb 10.3 on 2/10)  Assessment/Plan: 1. PAD w/ gangrenous changes -s/p R TMA on 2/1 healing poorly so s/p R BKA on 2/25. Now on CIR. 2. Sepsis: +BC w/enterococcus, onvanomycin&improving.Plan for vanc for 4 weeks after last negative culturewhich was 05/14/19.Etiology unclear, incision sites clean per VVS, CXR w/o active dz, no urine available to culture. ID following. TEE shows normal appearing AV prothesis, no vegetation.Clinically improved, remains on vancomycin. 3. ESRD- onMWF HD.K+ controlled. Next HDtoday, use 3K bath. 4. Anemiaof CKD-Last HGB 8.3.Aranesp q Friday. tsat 35%, no Fe needed. Transfuse prn. 5. Secondary hyperparathyroidism- Calcium controlled, phos now at goalw/increased Velphoro dose (3/2). Continue binders and VDRA.  6. HTN/volume- Blood pressure controlled. Euvolemic on exam  7. Nutrition- Alb 2.5, Renal diet w/fluid restrictions, protein  supplements, Vit 8. Hx TAVR 9. CAD/HFrEF: euvolemic  Jose Jose H. Osama Coleson NP-C 05/22/2019, 12:41 PM  [employer] [phone]

## 2019-05-22 NOTE — Progress Notes (Signed)
Physical Therapy Session Note  Patient Details  Name: Jose Garrett MRN: [id number] Date of Birth: 1950-11-13  Today's Date: 05/22/2019 PT Individual Time: 0929-1026 PT Individual Time Calculation (min): 57 min   Short Term Goals: Week 1:  PT Short Term Goal 1 (Week 1): Pt will perform bed mobility with supervision PT Short Term Goal 2 (Week 1): Pt will transfer bed<>chair with LRAD CGA PT Short Term Goal 3 (Week 1): Pt will ambulate 60ft with LRAD CGA  Skilled Therapeutic Interventions/Progress Updates:    Session focused on w/c mobility re-training in hospital and community environment, limb loss education in regards to ACE wrap/compression stocking, prosthetic process and importance of ROM, seated LE therex outside during education, and functional transfer training. Pt able to propel w/c throughout hospital > 250' with 1-2 rest breaks to get outside (>1000') due to decreased UE muscular endurance. Pt able to perform functional activity from w/c level to use vending machine to purchase snack prior to outside as part of community reintegration. Propelled w/c outside on uneven surfaces at supervision level with cues for technique on inclines for anterior weightshift and supervision for safety on sloped surface. Pt expressed awareness of difference of propulsion outside vs indoors and reported realizing he is going to use up a lot more energy outside. Discussed home environment and overall goals - pt expressed some mild anxiousness about d/c and being alone for some of the time but feeling confident in the progress he is making. Also discussed accessibility in terms of HD and getting outside independently. Pt able to propel the entire way back including navigating on/off the elevator in busy community setting demonstrating improved endurance. Pt able to set up w/c to prepare for transfer without assistance. Pt performed squat pivot transfer to R with CGA overall back to bed to slightly elevated bed  height to simulate home environment bed. Pt able to reposition in the bed without assistance.   Therapy Documentation Precautions:  Precautions Precautions: Fall Precaution Comments: NWB RLE Required Braces or Orthoses: Knee Immobilizer - Right Knee Immobilizer - Right: On at all times Restrictions Weight Bearing Restrictions: Yes RLE Weight Bearing: Non weight bearing   Pain:  Denies pain.   Therapy/Group: Individual Therapy  Canary Brim Ivory Broad, PT, DPT, CBIS  05/22/2019, 11:15 AM

## 2019-05-22 NOTE — Plan of Care (Signed)
  Problem: Consults Goal: RH LIMB LOSS PATIENT EDUCATION Description: Description: See Patient Education module for eduction specifics. Outcome: Progressing   Problem: RH SKIN INTEGRITY Goal: RH STG MAINTAIN SKIN INTEGRITY WITH ASSISTANCE Description: STG Maintain Skin Integrity With min Assistance. Outcome: Progressing   Problem: RH SAFETY Goal: RH STG ADHERE TO SAFETY PRECAUTIONS W/ASSISTANCE/DEVICE Description: STG Adhere to Safety Precautions With min Assistance and appropriate assistive Device. Outcome: Progressing   Problem: RH PAIN MANAGEMENT Goal: RH STG PAIN MANAGED AT OR BELOW PT'S PAIN GOAL Description: <4 on a 0-10 pain scale Outcome: Progressing

## 2019-05-22 NOTE — Progress Notes (Signed)
[facility] PHYSICAL MEDICINE & REHABILITATION PROGRESS NOTE  Subjective/Complaints: Patient sitting/standing up working with therapies this Am.  He states he slept slightly better overnight, but did not take his sleep aid.  Nausea improved yesterday. Discussed plan for dressing and patient education with therapies. He was seen by Nephro and Vascular - notes reviewed, no new changes.  ROS: Denies CP, shortness of breath, nausea, vomiting, diarrhea.  Objective: Vital Signs: Blood pressure (!) 128/53, pulse 63, temperature 97.7 F (36.5 C), temperature source Oral, resp. rate 16, height 5\' 10"  (1.778 m), weight 69.3 kg, SpO2 96 %. No results found. Recent Labs    05/20/19 1445 05/21/19 0608  WBC 5.8 5.9  HGB 7.7* 8.3*  HCT 24.9* 26.0*  PLT 119* 125*   Recent Labs    05/20/19 1445 05/21/19 0608  NA 133* 133*  K 4.5 3.9  CL 96* 96*  CO2 22 28  GLUCOSE 102* 104*  BUN 32* 14  CREATININE 7.42* 4.00*  CALCIUM 9.1 8.4*    Physical Exam: BP (!) 128/53 (BP Location: Left Arm)   Pulse 63   Temp 97.7 F (36.5 C) (Oral)   Resp 16   Ht 5\' 10"  (1.778 m)   Wt 69.3 kg   SpO2 96%   BMI 21.91 kg/m  Constitutional: No distress . Vital signs reviewed. HENT: Normocephalic.  Atraumatic. Eyes: EOMI. No discharge. Cardiovascular: No JVD. Respiratory: Normal effort.  No stridor. GI: Non-distended. Skin: Warm and dry.  Intact. Psych: Right BKA with dressing C/D/I Musc: Left lower extremity digits 2,3 amputations Neurologic: Alert Motor: Bilateral upper extremities: 4+/5 proximal distal Right lower extremity: Hip flexion 4/5, stable Left lower extremity: 4/5 proximal distal, stable  Assessment/Plan: 1. Functional deficits secondary to right BKA which require 3+ hours per day of interdisciplinary therapy in a comprehensive inpatient rehab setting.  Physiatrist is providing close team supervision and 24 hour management of active medical problems listed below.  Physiatrist and rehab  team continue to assess barriers to discharge/monitor patient progress toward functional and medical goals  Care Tool:  Bathing    Body parts bathed by patient: Right arm, Left arm, Chest, Abdomen, Front perineal area, Buttocks, Right upper leg, Left upper leg, Face     Body parts n/a: Right lower leg   Bathing assist Assist Level: Contact Guard/Touching assist     Upper Body Dressing/Undressing Upper body dressing   What is the patient wearing?: Button up shirt    Upper body assist Assist Level: Set up assist    Lower Body Dressing/Undressing Lower body dressing      What is the patient wearing?: Underwear/pull up, Pants     Lower body assist Assist for lower body dressing: Minimal Assistance - Patient > 75%     Toileting Toileting    Toileting assist Assist for toileting: Minimal Assistance - Patient > 75% Assistive Device Comment: bsc   Transfers Chair/bed transfer  Transfers assist     Chair/bed transfer assist level: Minimal Assistance - Patient > 75%     Locomotion Ambulation   Ambulation assist      Assist level: Minimal Assistance - Patient > 75% Assistive device: Walker-rolling Max distance: 56ft   Walk 10 feet activity   Assist     Assist level: Minimal Assistance - Patient > 75% Assistive device: Walker-rolling   Walk 50 feet activity   Assist Walk 50 feet with 2 turns activity did not occur: Safety/medical concerns(fatigue, balance/postural control, R BKA)  Walk 150 feet activity   Assist Walk 150 feet activity did not occur: Safety/medical concerns(fatigue, balance/postural control, R BKA)         Walk 10 feet on uneven surface  activity   Assist Walk 10 feet on uneven surfaces activity did not occur: Safety/medical concerns(fatigue, balance/postural control, R BKA)         Wheelchair     Assist Will patient use wheelchair at discharge?: Yes Type of Wheelchair: Manual    Wheelchair assist level:  Supervision/Verbal cueing Max wheelchair distance: 147ft    Wheelchair 50 feet with 2 turns activity    Assist        Assist Level: Supervision/Verbal cueing   Wheelchair 150 feet activity     Assist     Assist Level: Supervision/Verbal cueing      Medical Problem List and Plan: 1.Decreased functional mobilitysecondary to right BKA 2/25/2021after failed transmetatarsal amputation and recent right lower extremity bypass  Continue CIR 2. Antithrombotics: -DVT/anticoagulation:Subcutaneous heparin -antiplatelet therapy: Plavix 75 mg daily 3. Pain Management:Robaxin oxycodone as needed  Controlled with medications on 3/12  Monitor with increased exertion 4. Mood:Ativan as needed -antipsychotic agents: N/A 5. Neuropsych: This patientiscapable of making decisions on hisown behalf. 6. Skin/Wound Care:Routine skin checks 7. Fluids/Electrolytes/Nutrition:Routine in and outs. 8. ID/enterococcal bacteremia. Follow-up infectious disease. Continue IV vancomycin x4 weekswith HD to be completed 06/10/2019  Level within normal limits on 3/12 9. ESRD:Continue hemodialysis as directed 10. CAD/TAVRAugust 2020. Continue Plavix as well as amiodarone 200 mg daily as directed. 11. Chronic combined systolic and diastolic congestive heart failure. Monitor for any signs of fluid overload.Continue Coreg 3.125 mg twice daily Filed Weights   05/20/19 1242 05/20/19 1704 05/21/19 0442  Weight: 70.5 kg 69 kg 69.3 kg   Stable on 3/12 12. Acute on chronic anemia. Continue Aranesp.   Hemoglobin 8.3 on 3/11  Labs with HD today 13.Orthostasis. ProAmatine 10 mg Monday Wednesday Friday with hemodialysis.   Blood pressure labile with HD  Monitor with increased mobility 14. Hyperlipidemia. Lipitor 15.  Thrombocytopenia  Platelets 125 on 2/11, labs with HD today  Continue to monitor 16.  Sleep disturbance  Melatonin started on 3/11, encouraged  trial 17.  Intermittent transient nausea-likely multifactorial: Pain meds +/- IV antibiotics +/- bacteremia +/- HD  Improving   LOS: 4 days A FACE TO FACE EVALUATION WAS PERFORMED  Robertt Buda Lorie Phenix 05/22/2019, 8:49 AM

## 2019-05-22 NOTE — Care Management (Signed)
[facility] Individual Statement of Services  Patient Name:  LENVILLE HIBBERD  Date:  05/22/2019  Welcome to the [facility].  Our goal is to provide you with an individualized program based on your diagnosis and situation, designed to meet your specific needs.  With this comprehensive rehabilitation program, you will be expected to participate in at least 3 hours of rehabilitation therapies Monday-Friday, with modified therapy programming on the weekends.  Your rehabilitation program will include the following services:  Physical Therapy (PT), Occupational Therapy (OT), Speech Therapy (ST), 24 hour per day rehabilitation nursing, Therapeutic Recreaction (TR), Neuropsychology, Case Management (Social Worker), Rehabilitation Medicine, Nutrition Services and Pharmacy Services  Weekly team conferences will be held on Wednesdays to discuss your progress.  Your Social Industrial/product designer will talk with you frequently to get your input and to update you on team discussions.  Team conferences with you and your family in attendance may also be held.  Expected length of stay: 10 days Overall anticipated outcome: CGA-Supervision overall  Depending on your progress and recovery, your program may change. Your Social Industrial/product designer will coordinate services and will keep you informed of any changes. Your Social Worker's/Care Manager's name and contact numbers are listed  below.  The following services may also be recommended but are not provided by the [facility]:    [place] will be made to provide these services after discharge if needed.  Arrangements include referral to agencies that provide these services.  Your insurance has been verified to be:  [facility] Medicare Your primary doctor is:  [name], MD  Pertinent information will be shared with  your doctor and your insurance company.  Care Manager: Dorien Chihuahua , RN [phone] or [phone]) [phone]  Social Worker:  Loralee Pacas, [place] or ([phone]  Information discussed with and copy given to patient by: Margarito Liner, 05/22/2019, 12:47 PM

## 2019-05-22 NOTE — Care Management (Signed)
Patient Details  Name: Jose Garrett MRN: [id number] Date of Birth: [birth year]  Today's Date: 05/22/2019  Problem List:  Patient Active Problem List   Diagnosis Date Noted  . Sleep disturbance   . Nausea without vomiting   . Orthostatic hypotension   . Acute on chronic anemia   . Chronic combined systolic and diastolic congestive heart failure ([place])   . ESRD on dialysis ([place])   . Bacteremia   . Right below-knee amputee ([place]) 05/18/2019  . Pressure injury of skin 05/14/2019  . Enterococcal bacteremia 05/13/2019  . Polymorphic ventricular tachycardia ([place]) 10/24/2018  . S/P TAVR (transcatheter aortic valve replacement) 10/21/2018  . Anxiety   . Coronary artery disease involving native coronary artery of native heart without angina pectoris   . Thrombocytopenia ([place])   . PAD (peripheral artery disease) ([place]) 07/22/2018  . Acute on chronic combined systolic and diastolic CHF (congestive heart failure) ([place]) 11/30/2017  . ESRD (end stage renal disease) on dialysis ([place])   . Iron deficiency anemia 11/22/2015  . AL amyloidosis ([place]) 09/25/2014  . Anemia of chronic disease 09/25/2014  . Essential hypertension, benign 11/06/2013   Past Medical History:  Past Medical History:  Diagnosis Date  . Amyloidosis ([place])   . Anemia of chronic disease   . Anxiety   . CAD (coronary artery disease)    a. cardiac cath on 11/25/17 with occlusion of the RCA which could have been his event but the RCA could not be engaged. The RCA filled distally from left to right collaterals. There were no severe stenoses in the left coronary system. Medical therapy recommended, started on plavix.  . Chronic combined systolic and diastolic CHF (congestive heart failure) ([place])    a. EF dropped to 35-40% with grade 2 DD by echo 11/2017.  [name] DJD (degenerative joint disease), lumbar   . ESRD (end stage renal disease) ([place])    ESRD- HEMO MWF- [facility]  . GERD (gastroesophageal reflux disease)   . Hyperlipidemia    . Hypertension   . Myocardial infarction ([place])   . Neuropathy   . Peripheral vascular disease ([place])   . Pneumonia   . Restless legs   . S/P TAVR (transcatheter aortic valve replacement) 10/21/2018   26 mm Edwards Sapien 3 Ultra transcatheter heart valve placed via percutaneous right transfemoral approach   . Severe aortic stenosis    severe   Past Surgical History:  Past Surgical History:  Procedure Laterality Date  . ABDOMINAL AORTOGRAM W/LOWER EXTREMITY Bilateral 07/16/2018   Procedure: ABDOMINAL AORTOGRAM W/LOWER EXTREMITY;  Surgeon: Wellington Hampshire, MD;  Location: [facility];  Service: Cardiovascular;  Laterality: Bilateral;  . ABDOMINAL AORTOGRAM W/LOWER EXTREMITY N/A 04/09/2019   Procedure: ABDOMINAL AORTOGRAM W/LOWER EXTREMITY - Right Lower;  Surgeon: Waynetta Sandy, MD;  Location: [facility];  Service: Cardiovascular;  Laterality: N/A;  . AMPUTATION Left 08/19/2018   Procedure: AMPUTATION LEFT TOES SECOND AND THIRD;  Surgeon: Waynetta Sandy, MD;  Location: [facility];  Service: Vascular;  Laterality: Left;  . AMPUTATION Right 05/07/2019   Procedure: AMPUTATION BELOW KNEE;  Surgeon: Serafina Mitchell, MD;  Location: [facility] OR;  Service: Vascular;  Laterality: Right;  . AV FISTULA PLACEMENT Right 12/27/2015   Procedure: Right Arm ARTERIOVENOUS (AV) FISTULA CREATION;  Surgeon: Angelia Mould, MD;  Location: [facility];  Service: Vascular;  Laterality: Right;  . CATARACT EXTRACTION W/ INTRAOCULAR LENS  IMPLANT, BILATERAL    . COLONOSCOPY    .  ERCP W/ SPHICTEROTOMY    . FEMORAL-POPLITEAL BYPASS GRAFT Left 07/22/2018   Procedure: left FEMORAL TO BELOW KNEE POPLITEAL ARTERY BYPASS GREAFT using 42mm removable ring propaten gore graft;  Surgeon: Waynetta Sandy, MD;  Location: [facility];  Service: Vascular;  Laterality: Left;  . FEMORAL-POPLITEAL BYPASS GRAFT Right 04/10/2019   Procedure: BYPASS GRAFT RIGHT FEMORAL-POPLITEAL ARTERY;  Surgeon: Rosetta Posner,  MD;  Location: [facility];  Service: Vascular;  Laterality: Right;  . I & D EXTREMITY Right 05/05/2019   Procedure: Revision of  RIGHT  FOOT Amputation;  Surgeon: Waynetta Sandy, MD;  Location: [facility];  Service: Vascular;  Laterality: Right;  . INTRAOPERATIVE TRANSTHORACIC ECHOCARDIOGRAM N/A 10/21/2018   Procedure: Intraoperative Transthoracic Echocardiogram;  Surgeon: Sherren Mocha, MD;  Location: [facility];  Service: Open Heart Surgery;  Laterality: N/A;  . IR GENERIC HISTORICAL  01/27/2016   IR FLUORO GUIDE CV LINE RIGHT 01/27/2016 Arne Cleveland, MD [facility]INTER[facility]  . IR GENERIC HISTORICAL  01/27/2016   IR US GUIDE VASC ACCESS RIGHT 01/27/2016 Arne Cleveland, MD [facility]  . LAPAROSCOPIC CHOLECYSTECTOMY  03/2011  . LEFT HEART CATH AND CORONARY ANGIOGRAPHY N/A 11/25/2017   Procedure: LEFT HEART CATH AND CORONARY ANGIOGRAPHY;  Surgeon: Martinique, Peter M, MD;  Location: [facility];  Service: Cardiovascular;  Laterality: N/A;  . LEFT HEART CATH AND CORONARY ANGIOGRAPHY N/A 07/24/2018   Procedure: LEFT HEART CATH AND CORONARY ANGIOGRAPHY;  Surgeon: Sherren Mocha, MD;  Location: [facility];  Service: Cardiovascular;  Laterality: N/A;  . RENAL BIOPSY, PERCUTANEOUS Right 09/08/2014  . RIGHT/LEFT HEART CATH AND CORONARY ANGIOGRAPHY N/A 03/25/2018   Procedure: RIGHT/LEFT HEART CATH AND CORONARY ANGIOGRAPHY;  Surgeon: Martinique, Peter M, MD;  Location: [facility];  Service: Cardiovascular;  Laterality: N/A;  . TEE WITHOUT CARDIOVERSION N/A 05/14/2019   Procedure: TRANSESOPHAGEAL ECHOCARDIOGRAM (TEE);  Surgeon: Fay Records, MD;  [facility];  Service: Cardiovascular;  Laterality: N/A;  . TOE AMPUTATION Left 08/19/2018   2nd & 3rd toes  . TONSILLECTOMY  ~ 1960  . TRANSCATHETER AORTIC VALVE REPLACEMENT, TRANSFEMORAL N/A 10/21/2018   Procedure: TRANSCATHETER AORTIC VALVE REPLACEMENT, TRANSFEMORAL;  Surgeon: Sherren Mocha, MD;  Location: [facility];  Service: Open Heart Surgery;   Laterality: N/A;  . TRANSMETATARSAL AMPUTATION Right 04/29/2019   Procedure: RIGHT TRANSMETATARSAL AMPUTATION;  Surgeon: Waynetta Sandy, MD;  Location: [facility];  Service: Vascular;  Laterality: Right;  . ULTRASOUND GUIDANCE FOR VASCULAR ACCESS  03/25/2018   Procedure: Ultrasound Guidance For Vascular Access;  Surgeon: Martinique, Peter M, MD;  Location: [facility];  Service: Cardiovascular;;   Social History:  reports that he quit smoking about 8 years ago. His smoking use included cigarettes. He has a 80.00 pack-year smoking history. He has never used smokeless tobacco. He reports that he does not drink alcohol or use drugs.  Family / Support Systems Patient Roles: Parent Children: Daughter Alysia Penna Anticipated Caregiver: [name] and her Mom, [name]. Patient has been divorced from [place] for 38 years Ability/Limitations of Caregiver: [name] works days but [name], can come stay with him when [name] works Caregiver Availability: 24/7  Social History Preferred language: English Religion: [place] [employer] Insurance Read: Yes Write: Yes   Abuse/Neglect Abuse/Neglect Assessment Can Be Completed: Yes Physical Abuse: Denies Verbal Abuse: Denies Sexual Abuse: Denies Exploitation of patient/patient's resources: Denies Self-Neglect: Denies  Emotional Status Pt's affect, behavior and adjustment status: Flat affect, depressed mood and normal behavior  Patient / Family Perceptions,  Expectations & Goals Pt/Family understanding of illness & functional limitations: Patient has a good understanding of current health and functional limitations Premorbid pt/family roles/activities: Patient was independent prior to admission; previous rehab stay 08/2018 and discharged home at supervision level Anticipated changes in roles/activities/participation: May need assistance with transportation at discharge Pt/family expectations/goals: Patient would like to be able to do self care and toileting himself and daughter  will help with home management after work  [employer] available at discharge: Daughter can provide transportation  Discharge Planning Living Arrangements: [place]: Spouse/significant other, Children Type of Residence: Private residence Insurance Resources: Medicare Money Management: Patient Does the patient have any problems obtaining your medications?: No Home Management: Daughter will manage the home, laundry, cooking, cleaning,etc. Patient/Family Preliminary Plans: Discharge home with daughter; [place] Barriers to Discharge: Inaccessible home environment, Home environment access/layout, Decreased caregiver support, Wound Care [place] Barriers to Discharge Comments: 1 step entry to home, no ramp Social Work Anticipated Follow Up Needs: [place] Additional Notes/Comments: Daughter is looking for another home that is handicapped accessible Expected length of stay: ELOS 7 to 10 days  Clinical Impression: The patient is resting after therapy however appears depressed and down. Reports a lot has happened within the past year and his health gradually got worse instead of for the better. He is just tired. He notes daughter is supportive and with her help and friends he will manage. Previous rehab stay with DME obtained and reports his daughter is looking for another place to live that is handicapped accessible. States an understanding of the rehab stay and plan of care.  Dorien Chihuahua B 05/22/2019, 12:56 PM

## 2019-05-23 MED ORDER — PROCHLORPERAZINE MALEATE 5 MG PO TABS: 10.0000 mg | ORAL_TABLET | Freq: Four times a day (QID) | ORAL | Status: DC | PRN

## 2019-05-23 NOTE — Plan of Care (Signed)
  Problem: Consults Goal: RH LIMB LOSS PATIENT EDUCATION Description: Description: See Patient Education module for eduction specifics. Outcome: Progressing   Problem: RH BOWEL ELIMINATION Goal: RH STG MANAGE BOWEL WITH ASSISTANCE Description: STG Manage Bowel with min Assistance. Outcome: Progressing Goal: RH STG MANAGE BOWEL W/MEDICATION W/ASSISTANCE Description: STG Manage Bowel with Medication with min Assistance. Outcome: Progressing   Problem: RH SAFETY Goal: RH STG ADHERE TO SAFETY PRECAUTIONS W/ASSISTANCE/DEVICE Description: STG Adhere to Safety Precautions With min Assistance and appropriate assistive Device. Outcome: Progressing   Problem: RH PAIN MANAGEMENT Goal: RH STG PAIN MANAGED AT OR BELOW PT'S PAIN GOAL Description: <4 on a 0-10 pain scale Outcome: Progressing

## 2019-05-23 NOTE — Progress Notes (Signed)
[place] PHYSICAL MEDICINE & REHABILITATION PROGRESS NOTE  Subjective/Complaints:  Pt reports waiting for therapy this AM- main pain isn't BKA- is back pain- radiating needles due to lumbar stenosis; - takes oxycodone 5-10 mg as needed- tries to not take, if possible.   Waiting to receive stool softener- pending.  Small BM this AM but still needs to go.   Denies Sx' of Hb 7. 3  ROS:  Pt denies SOB, abd pain, CP, N/V/C/D, and vision changes   Objective: Vital Signs: Blood pressure (!) 133/49, pulse 66, temperature 98.2 F (36.8 C), resp. rate 18, height 5\' 10"  (1.778 m), weight 70.8 kg, SpO2 100 %. No results found. Recent Labs    05/21/19 0608 05/22/19 1249  WBC 5.9 5.2  HGB 8.3* 7.3*  HCT 26.0* 23.6*  PLT 125* 108*   Recent Labs    05/21/19 0608 05/22/19 1244  NA 133* 133*  K 3.9 4.4  CL 96* 94*  CO2 28 24  GLUCOSE 104* 109*  BUN 14 29*  CREATININE 4.00* 6.56*  CALCIUM 8.4* 8.8*    Physical Exam: BP (!) 133/49 (BP Location: Left Arm)   Pulse 66   Temp 98.2 F (36.8 C)   Resp 18   Ht 5\' 10"  (1.778 m)   Wt 70.8 kg   SpO2 100%   BMI 22.40 kg/m  Constitutional: No distress . Vital signs reviewed. Awake, alert, sitting up in bed; moving well, NAD HENT: Normocephalic.  Atraumatic. Eyes: conjugate gaze Cardiovascular: RRR; no JVD Respiratory: CTA B/L- good air movement. GI: soft, NT, slightly distended; hypoactive BS. Skin: Warm and dry.  Intact. R BKA with dressing intact Psych: appropriate; talkative Musc: Left lower extremity digits 2,3 amputations Neurologic: AOx3 Motor: Bilateral upper extremities: 4+/5 proximal distal Right lower extremity: Hip flexion 4/5, stable Left lower extremity: 4/5 proximal distal, stable  Assessment/Plan: 1. Functional deficits secondary to right BKA which require 3+ hours per day of interdisciplinary therapy in a comprehensive inpatient rehab setting.  Physiatrist is providing close team supervision and 24 hour  management of active medical problems listed below.  Physiatrist and rehab team continue to assess barriers to discharge/monitor patient progress toward functional and medical goals  Care Tool:  Bathing    Body parts bathed by patient: Right arm, Left arm, Chest, Abdomen, Front perineal area, Buttocks, Right upper leg, Left upper leg, Face     Body parts n/a: Right lower leg   Bathing assist Assist Level: Contact Guard/Touching assist     Upper Body Dressing/Undressing Upper body dressing   What is the patient wearing?: Button up shirt    Upper body assist Assist Level: Set up assist    Lower Body Dressing/Undressing Lower body dressing      What is the patient wearing?: Underwear/pull up, Pants     Lower body assist Assist for lower body dressing: Minimal Assistance - Patient > 75%     Toileting Toileting    Toileting assist Assist for toileting: Minimal Assistance - Patient > 75% Assistive Device Comment: bsc   Transfers Chair/bed transfer  Transfers assist     Chair/bed transfer assist level: Contact Guard/Touching assist     Locomotion Ambulation   Ambulation assist      Assist level: Minimal Assistance - Patient > 75% Assistive device: Walker-rolling Max distance: 82ft   Walk 10 feet activity   Assist     Assist level: Minimal Assistance - Patient > 75% Assistive device: Walker-rolling   Walk 50 feet activity  Assist Walk 50 feet with 2 turns activity did not occur: Safety/medical concerns(fatigue, balance/postural control, R BKA)         Walk 150 feet activity   Assist Walk 150 feet activity did not occur: Safety/medical concerns(fatigue, balance/postural control, R BKA)         Walk 10 feet on uneven surface  activity   Assist Walk 10 feet on uneven surfaces activity did not occur: Safety/medical concerns(fatigue, balance/postural control, R BKA)         Wheelchair     Assist Will patient use wheelchair at  discharge?: Yes Type of Wheelchair: Manual    Wheelchair assist level: Supervision/Verbal cueing Max wheelchair distance: 122ft    Wheelchair 50 feet with 2 turns activity    Assist        Assist Level: Supervision/Verbal cueing   Wheelchair 150 feet activity     Assist     Assist Level: Supervision/Verbal cueing      Medical Problem List and Plan: 1.Decreased functional mobilitysecondary to right BKA 2/25/2021after failed transmetatarsal amputation and recent right lower extremity bypass  Continue CIR 2. Antithrombotics: -DVT/anticoagulation:Subcutaneous heparin -antiplatelet therapy: Plavix 75 mg daily 3. Pain Management:Robaxin oxycodone as needed  Controlled with medications on 3/12  3/13- back pain is an issue- usually takes oxycodone 5-10 mg- but tries not to take regularly- recliner and back pain a problems during HD  Monitor with increased exertion 4. Mood:Ativan as needed -antipsychotic agents: N/A 5. Neuropsych: This patientiscapable of making decisions on hisown behalf. 6. Skin/Wound Care:Routine skin checks 7. Fluids/Electrolytes/Nutrition:Routine in and outs. 8. ID/enterococcal bacteremia. Follow-up infectious disease. Continue IV vancomycin x4 weekswith HD to be completed 06/10/2019  Level within normal limits on 3/12 9. ESRD:Continue hemodialysis as directed  3/13- M/W/F 10. CAD/TAVRAugust 2020. Continue Plavix as well as amiodarone 200 mg daily as directed. 11. Chronic combined systolic and diastolic congestive heart failure. Monitor for any signs of fluid overload.Continue Coreg 3.125 mg twice daily Filed Weights   05/22/19 1231 05/22/19 1636 05/23/19 0416  Weight: 70.6 kg 69.3 kg 70.8 kg   Stable on 3/12 12. Acute on chronic anemia. Continue Aranesp.   Hemoglobin 8.3 on 3/11  Labs with HD today  3/12- Hb down to 7.3- asymptomatic- per renal if needs transfusion.  13.Orthostasis. ProAmatine  10 mg Monday Wednesday Friday with hemodialysis.   Blood pressure labile with HD  Monitor with increased mobility 14. Hyperlipidemia. Lipitor 15.  Thrombocytopenia  Platelets 125 on 2/11, labs with HD today  Continue to monitor 16.  Sleep disturbance  Melatonin started on 3/11, encouraged trial  3/13- slept well  17.  Intermittent transient nausea-likely multifactorial: Pain meds +/- IV antibiotics +/- bacteremia +/- HD  Improving 18. Constipation  3/13- asking for stool sotener- declined laxative- will see how going and add Prn meds if needed   LOS: 5 days A FACE TO FACE EVALUATION WAS PERFORMED  Shailey Butterbaugh 05/23/2019, 2:09 PM

## 2019-05-23 NOTE — Progress Notes (Addendum)
[facility] Progress Note   Subjective: Seen in room. No C/O's today. Having issues with HD D/T back pain. Says recliner is worse. Will need to start working on this.      Objective Vitals:   05/22/19 2024 05/22/19 2042 05/23/19 0416 05/23/19 0842  BP: (!) 127/36 (!) 130/50 (!) 134/46 (!) 133/49  Pulse: 73 70 67 66  Resp: 18  18   Temp: 98 F (36.7 C)  98.2 F (36.8 C)   TempSrc: Oral     SpO2: 98%  100%   Weight:   70.8 kg   Height:       Physical Exam General: Pleasant older male in NAD Heart: S1,S2 RRR Lungs: CTAB A/P Abdomen: Active BS, S, NT Extremities: R BKA ace wrap no LLE edema Dialysis Access: R AVF + bruit   Additional Objective Labs: Basic Metabolic Panel: Recent Labs  Lab 05/20/19 1445 05/21/19 0608 05/22/19 1244  NA 133* 133* 133*  K 4.5 3.9 4.4  CL 96* 96* 94*  CO2 22 28 24   GLUCOSE 102* 104* 109*  BUN 32* 14 29*  CREATININE 7.42* 4.00* 6.56*  CALCIUM 9.1 8.4* 8.8*  PHOS 5.5* 3.8 5.4*   Liver Function Tests: Recent Labs  Lab 05/20/19 1445 05/21/19 0608 05/22/19 1244  ALBUMIN 2.4* 2.5* 2.5*   No results for input(s): LIPASE, AMYLASE in the last 168 hours. CBC: Recent Labs  Lab 05/18/19 0626 05/18/19 0626 05/19/19 0013 05/19/19 0013 05/20/19 1445 05/21/19 0608 05/22/19 1249  WBC 6.3   < > 6.5   < > 5.8 5.9 5.2  HGB 7.6*   < > 7.3*   < > 7.7* 8.3* 7.3*  HCT 24.3*   < > 23.3*   < > 24.9* 26.0* 23.6*  MCV 102.1*  --  102.2*  --  103.3* 101.6* 103.5*  PLT 102*   < > 109*   < > 119* 125* 108*   < > = values in this interval not displayed.   Blood Culture    Component Value Date/Time   SDES BLOOD LEFT ANTECUBITAL 05/14/2019 0522   SPECREQUEST AEROBIC BOTTLE ONLY Blood Culture adequate volume 05/14/2019 0522   CULT  05/14/2019 0522    NO GROWTH 5 DAYS Performed at [facility], [place] [address], [place], [place] [address]    REPTSTATUS 05/19/2019 FINAL 05/14/2019 0522    Cardiac Enzymes: No results for  input(s): CKTOTAL, CKMB, CKMBINDEX, TROPONINI in the last 168 hours. CBG: No results for input(s): GLUCAP in the last 168 hours. Iron Studies: No results for input(s): IRON, TIBC, TRANSFERRIN, FERRITIN in the last 72 hours. @lablastinr3 @ Studies/Results: No results found. Medications: . vancomycin 750 mg (05/22/19 1542)   . amiodarone  200 mg Oral Daily  . atorvastatin  80 mg Oral Q supper  . calcitRIOL  1.5 mcg Oral Q M,W,F-HD  . carvedilol  3.125 mg Oral BID  . Chlorhexidine Gluconate Cloth  6 each Topical Q0600  . clopidogrel  75 mg Oral Q supper  . darbepoetin (ARANESP) injection - DIALYSIS  100 mcg Intravenous Q Fri-HD  . docusate sodium  100 mg Oral Daily  . heparin  5,000 Units Subcutaneous Q8H  . Melatonin  1.5 mg Oral QHS  . midodrine  10 mg Oral Q M,W,F-HD  . multivitamin  1 tablet Oral QHS  . pantoprazole  40 mg Oral Daily  . sucroferric oxyhydroxide  1,500 mg Oral TID WC  . vitamin B-12  1,000 mcg Oral QPM  Dialysis Orders: MWF [facility] 4h 400/800 76.5kg 2/2 bath P4 RUE AVF  -Heparin  2400 units IV TIW - Parsabiv 5mg  IV q HD - Calcitriol 1.26mcg PO q HD - Mircera 79mcg IV q 2 weeks (last given 42mcg on 1/20, Hgb 10.3 on 2/10)  Assessment/Plan: 1. PAD w/ gangrenous changes -s/p R TMA on 2/1 healing poorly so s/p R BKA on 2/25. Now on CIR. 2. Sepsis: +BC w/enterococcus, onvanomycin&improving.Plan for vanc for 4 weeks after last negative culturewhich was 05/14/19.Etiology unclear, incision sites clean per VVS, CXR w/o active dz, no urine available to culture. ID following. TEE shows normal appearing AV prothesis, no vegetation.Clinically improved, remains on vancomycin. 3. ESRD- onMWF HD.K+ controlled. Next HD 05/25/19 use3K bath.Up in recliner next HD. Pain meds prior to HD if needed.  4. Anemiaof CKD-HGB down to 7.3 05/22/19. Asymptomatic today.Aranesp 100 mcg IV q Friday. tsat 35%, no Fe needed. Follow HGB,Transfuse prn. 5. Secondary  hyperparathyroidism- Calcium controlled, phosnow at goalw/increased Velphoro dose (3/2). Continue binders and VDRA.  6. HTN/volume-Blood pressure controlled. Euvolemic on exam 7. Nutrition- Alb 2.5, Renal diet w/fluid restrictions, protein supplements, Vit 8. Hx TAVR 9. CAD/HFrEF: euvolemic  Sumaiyah Markert H. Lamae Fosco NP-C 05/23/2019, 10:31 AM  [employer] [phone]

## 2019-05-24 ENCOUNTER — Inpatient Hospital Stay (HOSPITAL_COMMUNITY): Payer: Medicare Other | Admitting: Occupational Therapy

## 2019-05-24 ENCOUNTER — Inpatient Hospital Stay (HOSPITAL_COMMUNITY): Payer: Medicare Other | Admitting: Physical Therapy

## 2019-05-24 NOTE — Progress Notes (Signed)
[facility] PHYSICAL MEDICINE & REHABILITATION PROGRESS NOTE  Subjective/Complaints:  Pt reports was a little nauseated after dinner- ate only 1/2 supper- due to nausea- Had BM prior to dinner and didn't feel constipated.  Passed spontaneously per pt (was told gave compazine- not clear).   Pt unhappy about 1200cc fluid restriction- explained set by renal- don't have control over it. He voiced understanding.   ROS:   Pt denies SOB, abd pain, CP, N/V/C/D, and vision changes    Objective: Vital Signs: Blood pressure (!) 131/59, pulse 65, temperature 98.1 F (36.7 C), temperature source Oral, resp. rate 18, height 5\' 10"  (1.778 m), weight 71.8 kg, SpO2 100 %. No results found. Recent Labs    05/22/19 1249  WBC 5.2  HGB 7.3*  HCT 23.6*  PLT 108*   Recent Labs    05/22/19 1244  NA 133*  K 4.4  CL 94*  CO2 24  GLUCOSE 109*  BUN 29*  CREATININE 6.56*  CALCIUM 8.8*    Physical Exam: BP (!) 131/59 (BP Location: Left Arm)   Pulse 65   Temp 98.1 F (36.7 C) (Oral)   Resp 18   Ht 5\' 10"  (1.778 m)   Wt 71.8 kg   SpO2 100%   BMI 22.71 kg/m  Constitutional: No distress . Sitting in shower- getting bathed/washed up, OT at side, NAD HENT: Normocephalic.  Atraumatic. Eyes: conjugate gaze Cardiovascular: no JVD Respiratory: no resp distress; no accessory muscle use; breathing normal rate GI: soft, Nondistended Skin: Warm and dry.  Dressing over R BKA- canot assess Psych: appropriate; talkative Musc: Left lower extremity digits 2,3 amputations Neurologic:Ox3 Motor: Bilateral upper extremities: 4+/5 proximal distal Right lower extremity: Hip flexion 4/5, stable Left lower extremity: 4/5 proximal distal, stable  Assessment/Plan: 1. Functional deficits secondary to right BKA which require 3+ hours per day of interdisciplinary therapy in a comprehensive inpatient rehab setting.  Physiatrist is providing close team supervision and 24 hour management of active medical  problems listed below.  Physiatrist and rehab team continue to assess barriers to discharge/monitor patient progress toward functional and medical goals  Care Tool:  Bathing    Body parts bathed by patient: Right arm, Left arm, Chest, Abdomen, Front perineal area, Buttocks, Right upper leg, Left upper leg, Face     Body parts n/a: Right lower leg   Bathing assist Assist Level: Contact Guard/Touching assist     Upper Body Dressing/Undressing Upper body dressing   What is the patient wearing?: Button up shirt    Upper body assist Assist Level: Set up assist    Lower Body Dressing/Undressing Lower body dressing      What is the patient wearing?: Underwear/pull up, Pants     Lower body assist Assist for lower body dressing: Minimal Assistance - Patient > 75%     Toileting Toileting    Toileting assist Assist for toileting: Minimal Assistance - Patient > 75% Assistive Device Comment: bsc   Transfers Chair/bed transfer  Transfers assist     Chair/bed transfer assist level: Contact Guard/Touching assist     Locomotion Ambulation   Ambulation assist      Assist level: Minimal Assistance - Patient > 75% Assistive device: Walker-rolling Max distance: 81ft   Walk 10 feet activity   Assist     Assist level: Minimal Assistance - Patient > 75% Assistive device: Walker-rolling   Walk 50 feet activity   Assist Walk 50 feet with 2 turns activity did not occur: Safety/medical concerns(fatigue, balance/postural control,  R BKA)         Walk 150 feet activity   Assist Walk 150 feet activity did not occur: Safety/medical concerns(fatigue, balance/postural control, R BKA)         Walk 10 feet on uneven surface  activity   Assist Walk 10 feet on uneven surfaces activity did not occur: Safety/medical concerns(fatigue, balance/postural control, R BKA)         Wheelchair     Assist Will patient use wheelchair at discharge?: Yes Type of  Wheelchair: Manual    Wheelchair assist level: Supervision/Verbal cueing Max wheelchair distance: 111ft    Wheelchair 50 feet with 2 turns activity    Assist        Assist Level: Supervision/Verbal cueing   Wheelchair 150 feet activity     Assist     Assist Level: Supervision/Verbal cueing      Medical Problem List and Plan: 1.Decreased functional mobilitysecondary to right BKA 2/25/2021after failed transmetatarsal amputation and recent right lower extremity bypass  Continue CIR 2. Antithrombotics: -DVT/anticoagulation:Subcutaneous heparin -antiplatelet therapy: Plavix 75 mg daily 3. Pain Management:Robaxin oxycodone as needed  Controlled with medications on 3/12  3/13- back pain is an issue- usually takes oxycodone 5-10 mg- but tries not to take regularly- recliner and back pain a problems during HD  Monitor with increased exertion 4. Mood:Ativan as needed -antipsychotic agents: N/A 5. Neuropsych: This patientiscapable of making decisions on hisown behalf. 6. Skin/Wound Care:Routine skin checks 7. Fluids/Electrolytes/Nutrition:Routine in and outs. 8. ID/enterococcal bacteremia. Follow-up infectious disease. Continue IV vancomycin x4 weekswith HD to be completed 06/10/2019  Level within normal limits on 3/12 9. ESRD:Continue hemodialysis as directed  3/13- M/W/F 10. CAD/TAVRAugust 2020. Continue Plavix as well as amiodarone 200 mg daily as directed. 11. Chronic combined systolic and diastolic congestive heart failure. Monitor for any signs of fluid overload.Continue Coreg 3.125 mg twice daily Filed Weights   05/22/19 1636 05/23/19 0416 05/24/19 0449  Weight: 69.3 kg 70.8 kg 71.8 kg   3/14- renal added fluid restriction due to weight gain last 2 days- from 69.3 to 71.8 in last 2 days.  12. Acute on chronic anemia. Continue Aranesp.   Hemoglobin 8.3 on 3/11  Labs with HD today  3/12- Hb down to 7.3- asymptomatic-  per renal if needs transfusion.  13.Orthostasis. ProAmatine 10 mg Monday Wednesday Friday with hemodialysis.   Blood pressure labile with HD  Monitor with increased mobility 14. Hyperlipidemia. Lipitor 15.  Thrombocytopenia  Platelets 125 on 2/11, labs with HD today  Continue to monitor 16.  Sleep disturbance  Melatonin started on 3/11, encouraged trial  3/13- slept well  17.  Intermittent transient nausea-likely multifactorial: Pain meds +/- IV antibiotics +/- bacteremia +/- HD  Improving 18. Constipation  3/13- asking for stool sotener- declined laxative- will see how going and add Prn meds if needed  3/14- had good BM- denies constipation now 19. Nausea  3/14- added compazine to med list as needed   LOS: 6 days A FACE TO FACE EVALUATION WAS PERFORMED  Jose Garrett 05/24/2019, 10:32 AM

## 2019-05-24 NOTE — Progress Notes (Signed)
Occupational Therapy Session Note  Patient Details  Name: Jose Garrett MRN: [id number] Date of Birth: [birth year]  Today's Date: 05/24/2019 OT Individual Time: 1694-5038 OT Individual Time Calculation (min): 59 min    Short Term Goals: Week 1:  OT Short Term Goal 1 (Week 1): Pt will complete toilet transfers with supervision OT Short Term Goal 2 (Week 1): Pt will complete LB dressing at sit > stand level with supervision OT Short Term Goal 3 (Week 1): Pt will complete toileting tasks with supervision  Skilled Therapeutic Interventions/Progress Updates:    Patient in bed, alert and aware of needs.  He denies pain.  Supine to sit with CS.  Sit to stand and SPT with RW to w/c with CGA.  He is able to obtain clothing w/c level.  SPT with RW to shower bench with min A. Shower completed in a seated position with hand held shower DS.  He returned to w/c min A to complete dressing tasks CS for donning OH shirt, underwear, pants and left shoe, CGA for CM in stance.  Grooming tasks mod I w/c level.  He is able to propel w/c to and from therapy gym.  Completed UB ergometer x 5 minutes.  He remained sitting in w/c at close of session, seat alarm set and call bell / tray table in reach.    Therapy Documentation Precautions:  Precautions Precautions: Fall Precaution Comments: NWB RLE Required Braces or Orthoses: Knee Immobilizer - Right Knee Immobilizer - Right: On at all times Restrictions Weight Bearing Restrictions: Yes RLE Weight Bearing: Non weight bearing General:   Vital Signs: Therapy Vitals Temp: 98.1 F (36.7 C) Temp Source: Oral Pulse Rate: 63 Resp: 18 BP: 138/64 Patient Position (if appropriate): Lying Oxygen Therapy SpO2: 100 % O2 Device: Room Air   Therapy/Group: Individual Therapy  Carlos Levering 05/24/2019, 7:31 AM

## 2019-05-24 NOTE — Progress Notes (Signed)
Physical Therapy Session Note  Patient Details  Name: Jose Garrett MRN: [id number] Date of Birth: December 16, 1950  Today's Date: 05/24/2019 PT Individual Time: 1105-1200  PT Individual Time Calculation (min): 55 min and 56 min.  Short Term Goals: Week 1:  PT Short Term Goal 1 (Week 1): Pt will perform bed mobility with supervision PT Short Term Goal 2 (Week 1): Pt will transfer bed<>chair with LRAD CGA PT Short Term Goal 3 (Week 1): Pt will ambulate 77ft with LRAD CGA  Skilled Therapeutic Interventions/Progress Updates:   First session:  Pt presents sleeping in bed, but arousable and participates w/ therapy. Pt transfers sup <> sit w/ CGA , including from mat table.   Pt transfers multiple surfaces (bed, w/c, mat table) w/ CGA using lateral scoot transfers w/ verbal cues for forward lean to clear surface. Pt performed supine bridging w/ bolster to right thigh, abd w/ orange T-band, hip/ knee flex, and prone lying for stretch of hip and knee flexors, knee flexion including CR PNF to increase knee extension.  Pt required rest breaks d/t fatigue.  Pt requires increased time for transition from sup <> prone.  Second session.  Pt transferred sup to sit w/ independent, CGA lateral scoot bed > w/c.  Pt negotiated w/c in room to get jacket from closet and then in hallways up to at least 200' before requiring rest breaks.  Pt negotiated on uneven surfaces outside w/ supervision , but occasional min A.  Pt performed transfers lateral scoot w/c <> bench w/ increased verbal cues.  Pt performed sit to stand transfer w/ min A and performed standing right hip abd 3 x 10.  Pt amb approx. 20' w/ FWW and CGA multiple trials.  Pt c/o fatigue to upper arms.  Pt requested to stay sitting in w/c.  Seat alarm on and all needs in reach.      Therapy Documentation Precautions:  Precautions Precautions: Fall Precaution Comments: NWB RLE Required Braces or Orthoses: Knee Immobilizer - Right Knee Immobilizer - Right: On  at all times Restrictions Weight Bearing Restrictions: Yes RLE Weight Bearing: Non weight bearing General:   Vital Signs: Therapy Vitals Pulse Rate: 65 BP: (!) 131/59 Patient Position (if appropriate): Sitting Pain: no c/o pain. Pain Assessment Pain Scale: 0-10 Pain Score: 0-No pain Mobility:      Therapy/Group: Individual Therapy  Ladoris Gene 05/24/2019, 12:49 PM

## 2019-05-25 ENCOUNTER — Inpatient Hospital Stay (HOSPITAL_COMMUNITY): Payer: Medicare Other | Admitting: Occupational Therapy

## 2019-05-25 ENCOUNTER — Inpatient Hospital Stay (HOSPITAL_COMMUNITY): Payer: Medicare Other

## 2019-05-25 LAB — RENAL FUNCTION PANEL
Albumin: 2.7 g/dL — ABNORMAL LOW (ref 3.5–5.0)
Anion gap: 15 (ref 5–15)
BUN: 37 mg/dL — ABNORMAL HIGH (ref 8–23)
CO2: 23 mmol/L (ref 22–32)
Calcium: 9.3 mg/dL (ref 8.9–10.3)
Chloride: 98 mmol/L (ref 98–111)
Creatinine, Ser: 8.63 mg/dL — ABNORMAL HIGH (ref 0.61–1.24)
GFR calc Af Amer: 7 mL/min — ABNORMAL LOW (ref >60)
GFR calc non Af Amer: 6 mL/min — ABNORMAL LOW (ref >60)
Glucose, Bld: 130 mg/dL — ABNORMAL HIGH (ref 70–99)
Phosphorus: 6.4 mg/dL — ABNORMAL HIGH (ref 2.5–4.6)
Potassium: 4.6 mmol/L (ref 3.5–5.1)
Sodium: 136 mmol/L (ref 135–145)

## 2019-05-25 LAB — CBC
HCT: 25.7 % — ABNORMAL LOW (ref 39.0–52.0)
Hemoglobin: 7.9 g/dL — ABNORMAL LOW (ref 13.0–17.0)
MCH: 32.5 pg (ref 26.0–34.0)
MCHC: 30.7 g/dL (ref 30.0–36.0)
MCV: 105.8 fL — ABNORMAL HIGH (ref 80.0–100.0)
Platelets: 105 10*3/uL — ABNORMAL LOW (ref 150–400)
RBC: 2.43 MIL/uL — ABNORMAL LOW (ref 4.22–5.81)
RDW: 15.3 % (ref 11.5–15.5)
WBC: 5.4 10*3/uL (ref 4.0–10.5)
nRBC: 0 % (ref 0.0–0.2)

## 2019-05-25 MED ORDER — CALCITRIOL 0.5 MCG PO CAPS
ORAL_CAPSULE | ORAL | Status: AC
  Administered 2019-05-25: 1.5 ug via ORAL
  Filled 2019-05-25: qty 3

## 2019-05-25 MED ORDER — HEPARIN SODIUM (PORCINE) 1000 UNIT/ML IJ SOLN
INTRAMUSCULAR | Status: AC
  Administered 2019-05-25: 2400 [IU]
  Filled 2019-05-25: qty 3

## 2019-05-25 MED ORDER — MIDODRINE HCL 5 MG PO TABS
ORAL_TABLET | ORAL | Status: AC
  Administered 2019-05-25: 10 mg via ORAL
  Filled 2019-05-25: qty 2

## 2019-05-25 MED ORDER — DOCUSATE SODIUM 100 MG PO CAPS
100.0000 mg | ORAL_CAPSULE | Freq: Two times a day (BID) | ORAL | Status: DC
  Administered 2019-05-25 – 2019-05-27 (×4): 100 mg via ORAL
  Filled 2019-05-25 (×4): qty 1

## 2019-05-25 NOTE — Progress Notes (Signed)
Physical Therapy Session Note  Patient Details  Name: DAYVIAN BLIXT MRN: [id number] Date of Birth: 02/02/1951  Today's Date: 05/25/2019 PT Individual Time: 0913-1026 PT Individual Time Calculation (min): 73 min   Short Term Goals: Week 1:  PT Short Term Goal 1 (Week 1): Pt will perform bed mobility with supervision PT Short Term Goal 2 (Week 1): Pt will transfer bed<>chair with LRAD CGA PT Short Term Goal 3 (Week 1): Pt will ambulate 76ft with LRAD CGA  Skilled Therapeutic Interventions/Progress Updates:    Session focused on functional transfer training, d/c planning  introducing stair negotiation for home entry practice, w/c mobility and parts management training, supine and sidelying therex for RLE for strengthening and ROM, and NMR for balance retraining in standing.  Pt performed squat pivot transfers with close supervision to CGA including parts management. Attempted curb step negotiation (demonstrated technique forwards and backwards as options). Pt elected to attempt forward first but unable due to strength and requires mod to max assist to prevent fall when LOB occurred. Pt able to maintain balance with min assist to lift and lower RW up onto the step. Rotated around with min assist for balance and attempted going backwards but pt unable due to decreased UE strength. Practiced in standing with RW attempting just to lift L foot up with focus on technique through UE's x 10 reps.   NMR for balance retraining while performing functional reaching task to various heigh surface to the L and the R. CGA for L and min assist for the R. Educated on fall risk in relationship to functional reaching tasks at home upon d/c.   Supine and sidelying therex including sidelying hip abduction, hip extension, supine hip/knee flexion and quad sets x 15 reps each x 2 sets. Core strengthening to aid with overall mobility for modified crunch, and then using weighted medicine ball for PNF diagonals x 10 reps x 2  sets.   Pt reports daughter is coming in for family education tomorrow during a session. Recommending at this time to demonstrate bumping up w/c up a curb step for home access as pt unsafe to perform step negotiation with RW. Pt in agreement. Pt also reports he may look into getting a ramp in the future for increased independent access. Discussed benefits of this even with the plan for a prosthesis as pt is also a HD patient.    Therapy Documentation Precautions:  Precautions Precautions: Fall Precaution Comments: NWB RLE Required Braces or Orthoses: Knee Immobilizer - Right Knee Immobilizer - Right: On at all times Restrictions Weight Bearing Restrictions: Yes RLE Weight Bearing: Non weight bearing   Pain:  Denies pai.   Therapy/Group: Individual Therapy  Canary Brim Ivory Broad, PT, DPT, CBIS  05/25/2019, 10:30 AM

## 2019-05-25 NOTE — Progress Notes (Signed)
Occupational Therapy Session Note  Patient Details  Name: Jose Garrett MRN: [id number] Date of Birth: 06-15-1950  Today's Date: 05/25/2019 OT Individual Time: 2725-3664 OT Individual Time Calculation (min): 41 min   Short Term Goals: Week 1:  OT Short Term Goal 1 (Week 1): Pt will complete toilet transfers with supervision OT Short Term Goal 2 (Week 1): Pt will complete LB dressing at sit > stand level with supervision OT Short Term Goal 3 (Week 1): Pt will complete toileting tasks with supervision  Skilled Therapeutic Interventions/Progress Updates:    Pt greeted in the w/c, asleep, easily woken with no c/o pain. ADL needs met and pt states he uses leans successfully during toileting and dressing tasks already. Discussed daily residual limb inspection with pt reporting having a LH mirror and using it when RN does the dressing changes. He self propelled to the gym via w/c and then worked on pressure relief positioning via lateral leans and w/c push ups, clearing buttocks completely during push ups for 25 second holds. Discussed incorporating pressure relief into his daily routine, I.e. during commercials when watching TV or when listening to music. Pt receptive. Worked on increasing functional independence with leg rest and limb rest management. Pt able to retrieve w/c parts from floor and was able to problem solve how to apply each one onto the w/c himself with vcs for locking w/c brakes prior to dynamic sitting activity. He then self propelled back to the room. Taught pt UB stretches pertaining to shoulder retraction, shoulder extension, as well as elbow and wrist extension. Discussed importance of completing stated stretches daily to offset flexed postures elicited by w/c level activities. Pt once again receptive to education. Left him in w/c with all needs within reach, awaiting lunch.   Therapy Documentation Precautions:  Precautions Precautions: Fall Precaution Comments: NWB RLE Required  Braces or Orthoses: Knee Immobilizer - Right Knee Immobilizer - Right: On at all times Restrictions Weight Bearing Restrictions: Yes RLE Weight Bearing: Non weight bearing Pain: Pain Assessment Pain Scale: 0-10 Pain Score: 0-No pain ADL: ADL Eating: Independent Where Assessed-Eating: Bed level Grooming: Setup Where Assessed-Grooming: Sitting at sink Upper Body Dressing: Setup Where Assessed-Upper Body Dressing: Bed level Lower Body Dressing: Minimal assistance Where Assessed-Lower Body Dressing: Edge of bed Toilet Transfer: Minimal assistance Toilet Transfer Method: Squat pivot Toilet Transfer Equipment: Drop arm bedside commode      Therapy/Group: Individual Therapy  Antaeus Karel A Nacole Fluhr 05/25/2019, 12:33 PM

## 2019-05-25 NOTE — Procedures (Signed)
I was present at this dialysis session. I have reviewed the session itself and made appropriate changes.   AVF, 2L UF goal, 2K bath. Tol Tx well Qb 400. Tentative DC is 3/18.  Next HD 3/17. Hb stable 7.9.    Filed Weights   05/24/19 0449 05/25/19 0500 05/25/19 1308  Weight: 71.8 kg 73.3 kg 72.9 kg    Recent Labs  Lab 05/25/19 1330  NA 136  K 4.6  CL 98  CO2 23  GLUCOSE 130*  BUN 37*  CREATININE 8.63*  CALCIUM 9.3  PHOS 6.4*    Recent Labs  Lab 05/21/19 0608 05/22/19 1249 05/25/19 1325  WBC 5.9 5.2 5.4  HGB 8.3* 7.3* 7.9*  HCT 26.0* 23.6* 25.7*  MCV 101.6* 103.5* 105.8*  PLT 125* 108* 105*    Scheduled Meds: . amiodarone  200 mg Oral Daily  . atorvastatin  80 mg Oral Q supper  . calcitRIOL  1.5 mcg Oral Q M,W,F-HD  . carvedilol  3.125 mg Oral BID  . Chlorhexidine Gluconate Cloth  6 each Topical Q0600  . clopidogrel  75 mg Oral Q supper  . darbepoetin (ARANESP) injection - DIALYSIS  100 mcg Intravenous Q Fri-HD  . docusate sodium  100 mg Oral BID  . heparin  5,000 Units Subcutaneous Q8H  . Melatonin  1.5 mg Oral QHS  . midodrine  10 mg Oral Q M,W,F-HD  . multivitamin  1 tablet Oral QHS  . pantoprazole  40 mg Oral Daily  . sucroferric oxyhydroxide  1,500 mg Oral TID WC  . vitamin B-12  1,000 mcg Oral QPM   Continuous Infusions: . vancomycin 750 mg (05/22/19 1542)   PRN Meds:.acetaminophen **OR** acetaminophen, bisacodyl, LORazepam, methocarbamol, oxyCODONE, prochlorperazine, senna-docusate, sorbitol   Pearson Grippe  MD 05/25/2019, 2:26 PM

## 2019-05-25 NOTE — Progress Notes (Signed)
Pharmacy Antibiotic Note  Jose Garrett is a 69 y.o. male admitted on 05/18/2019 with Enterococcal bacteremia.  Pharmacy has been consulted for vancomycin dosing. Patient's enterococcus is Amp-S but switching to Vanc for ease of dosing as an outpatient. He is noted to have healing at site of amputation. WBC are wnl at 6.6 (last on 3/5). He is afebrile. ID is following and recommends 4 weeks of treatment through March 31st.  Continues HD MWF, last vancomycin level therapeutic 3/12  Microbiology results:  3/1 BCx: e. Faecalis R to gent, sensitive to ampicillin and vancomycin  3/4 BCx: ngtd   Plan: Continue vancomycin 750 mg IV qHD MWF Plan 4 weeks of therapy through 3/31 Will obtain weekly vancomycin levels qFri Monitor HD plans/changes, LOT  Height: 5\' 10"  (177.8 cm) Weight: 161 lb 9.6 oz (73.3 kg) IBW/kg (Calculated) : 73  Temp (24hrs), Avg:98.1 F (36.7 C), Min:98 F (36.7 C), Max:98.2 F (36.8 C)  Recent Labs  Lab 05/19/19 0013 05/20/19 1445 05/21/19 0608 05/22/19 0552 05/22/19 1244 05/22/19 1249  WBC 6.5 5.8 5.9  --   --  5.2  CREATININE  --  7.42* 4.00*  --  6.56*  --   VANCORANDOM  --   --   --  18  --   --     Estimated Creatinine Clearance: 11.3 mL/min (A) (by C-G formula based on SCr of 6.56 mg/dL (H)).    No Known Allergies  Vanc 3/1>>3/2>> (3/31) - 3/8 VR: 27 mcg/ml - 500 mg x 1 s/p HD then back to 750 mg/HD-MWF Zosyn 3/1>>3/2 Amp 3/2>>3/4  3/1 BCx: enterococcus faec- sens to amp 3/4 blood x2- ngtd  Bertis Ruddy, PharmD Clinical Pharmacist Please check AMION for all [place] numbers 05/25/2019 7:56 AM

## 2019-05-25 NOTE — Progress Notes (Signed)
[facility] PHYSICAL MEDICINE & REHABILITATION PROGRESS NOTE  Subjective/Complaints: Feeling well this morning. Happy to be doing basketball related activity with therapy Denies pain. Has phantom limb sensation. Requests stool softener due to hard stool last night.  ROS:  Pt denies SOB, abd pain, CP, N/V/C/D, and vision changes  Objective: Vital Signs: Blood pressure (!) 148/64, pulse 65, temperature 98 F (36.7 C), temperature source Oral, resp. rate 18, height 5\' 10"  (1.778 m), weight 73.3 kg, SpO2 100 %. No results found. Recent Labs    05/22/19 1249  WBC 5.2  HGB 7.3*  HCT 23.6*  PLT 108*   Recent Labs    05/22/19 1244  NA 133*  K 4.4  CL 94*  CO2 24  GLUCOSE 109*  BUN 29*  CREATININE 6.56*  CALCIUM 8.8*    Physical Exam: BP (!) 148/64 (BP Location: Left Arm)   Pulse 65   Temp 98 F (36.7 C) (Oral)   Resp 18   Ht 5\' 10"  (1.778 m)   Wt 73.3 kg   SpO2 100%   BMI 23.19 kg/m  Constitutional: No distress . Standing with PT throwing rings in basketball hoop, enjoying activity HENT: Normocephalic.  Atraumatic. Eyes: conjugate gaze Cardiovascular: no JVD Respiratory: no resp distress; no accessory muscle use; breathing normal rate GI: soft, Nondistended Skin: Warm and dry.  Dressing over R BKA- canot assess Psych: appropriate; talkative Musc: Left lower extremity digits 2,3 amputations Neurologic:Ox3 Motor: Bilateral upper extremities: 4+/5 proximal distal Right lower extremity: Hip flexion 4/5, stable Left lower extremity: 4/5 proximal distal, stable  Assessment/Plan: 1. Functional deficits secondary to right BKA which require 3+ hours per day of interdisciplinary therapy in a comprehensive inpatient rehab setting.  Physiatrist is providing close team supervision and 24 hour management of active medical problems listed below.  Physiatrist and rehab team continue to assess barriers to discharge/monitor patient progress toward functional and medical  goals  Care Tool:  Bathing    Body parts bathed by patient: Right arm, Left arm, Chest, Abdomen, Front perineal area, Buttocks, Right upper leg, Left upper leg, Face     Body parts n/a: Right lower leg   Bathing assist Assist Level: Contact Guard/Touching assist     Upper Body Dressing/Undressing Upper body dressing   What is the patient wearing?: Button up shirt    Upper body assist Assist Level: Set up assist    Lower Body Dressing/Undressing Lower body dressing      What is the patient wearing?: Underwear/pull up, Pants     Lower body assist Assist for lower body dressing: Minimal Assistance - Patient > 75%     Toileting Toileting    Toileting assist Assist for toileting: Minimal Assistance - Patient > 75% Assistive Device Comment: bsc   Transfers Chair/bed transfer  Transfers assist     Chair/bed transfer assist level: Contact Guard/Touching assist     Locomotion Ambulation   Ambulation assist      Assist level: Minimal Assistance - Patient > 75% Assistive device: Walker-rolling Max distance: 20'   Walk 10 feet activity   Assist     Assist level: Minimal Assistance - Patient > 75% Assistive device: Walker-rolling   Walk 50 feet activity   Assist Walk 50 feet with 2 turns activity did not occur: Safety/medical concerns         Walk 150 feet activity   Assist Walk 150 feet activity did not occur: Safety/medical concerns(fatigue, balance/postural control, R BKA)  Walk 10 feet on uneven surface  activity   Assist Walk 10 feet on uneven surfaces activity did not occur: Safety/medical concerns(fatigue, balance/postural control, R BKA)         Wheelchair     Assist Will patient use wheelchair at discharge?: Yes Type of Wheelchair: Manual    Wheelchair assist level: Supervision/Verbal cueing Max wheelchair distance: >200'    Wheelchair 50 feet with 2 turns activity    Assist        Assist Level:  Supervision/Verbal cueing   Wheelchair 150 feet activity     Assist     Assist Level: Supervision/Verbal cueing      Medical Problem List and Plan: 1.Decreased functional mobilitysecondary to right BKA 2/25/2021after failed transmetatarsal amputation and recent right lower extremity bypass  Continue CIR 2. Antithrombotics: -DVT/anticoagulation:Subcutaneous heparin -antiplatelet therapy: Plavix 75 mg daily 3. Pain Management:Robaxin oxycodone as needed  Controlled with medications on 3/12  3/13- back pain is an issue- usually takes oxycodone 5-10 mg- but tries not to take regularly- recliner and back pain a problems during HD  3/15: stable  Monitor with increased exertion 4. Mood:Ativan as needed -antipsychotic agents: N/A 5. Neuropsych: This patientiscapable of making decisions on hisown behalf. 6. Skin/Wound Care:Routine skin checks 7. Fluids/Electrolytes/Nutrition:Routine in and outs. 8. ID/enterococcal bacteremia. Follow-up infectious disease. Continue IV vancomycin x4 weekswith HD to be completed 06/10/2019  Level within normal limits on 3/12 9. ESRD:Continue hemodialysis as directed  3/13- M/W/F 10. CAD/TAVRAugust 2020. Continue Plavix as well as amiodarone 200 mg daily as directed. 11. Chronic combined systolic and diastolic congestive heart failure. Monitor for any signs of fluid overload.Continue Coreg 3.125 mg twice daily Filed Weights   05/23/19 0416 05/24/19 0449 05/25/19 0500  Weight: 70.8 kg 71.8 kg 73.3 kg   3/14- renal added fluid restriction due to weight gain last 2 days- from 69.3 to 71.8 in last 2 days.   3/15: weight increase to 73.3kg. Continue to monitor. Has HD today.  12. Acute on chronic anemia. Continue Aranesp.   Hemoglobin 8.3 on 3/11  Labs with HD today  3/12- Hb down to 7.3- asymptomatic- per renal if needs transfusion.  13.Orthostasis. ProAmatine 10 mg Monday Wednesday Friday with  hemodialysis.   Blood pressure labile with HD  Monitor with increased mobility 14. Hyperlipidemia. Lipitor 15.  Thrombocytopenia  Platelets 125 on 2/11, labs with HD today  Continue to monitor 16.  Sleep disturbance  Melatonin started on 3/11, encouraged trial  3/13- slept well  17.  Intermittent transient nausea-likely multifactorial: Pain meds +/- IV antibiotics +/- bacteremia +/- HD  Improving 18. Constipation  3/13- asking for stool sotener- declined laxative- will see how going and add Prn meds if needed  3/14- had good BM- denies constipation now  3/15: Had hard stool with straining last night. Increased Colace to BID.  19. Nausea  3/14- added compazine to med list as needed   LOS: 7 days A FACE TO [place] 05/25/2019, 11:15 AM

## 2019-05-25 NOTE — Plan of Care (Signed)
  Problem: Consults Goal: RH LIMB LOSS PATIENT EDUCATION Description: Description: See Patient Education module for eduction specifics. Outcome: Progressing Goal: Skin Care Protocol Initiated - if Braden Score 18 or less Description: If consults are not indicated, leave blank or document N/A Outcome: Progressing   Problem: RH BOWEL ELIMINATION Goal: RH STG MANAGE BOWEL WITH ASSISTANCE Description: STG Manage Bowel with min Assistance. Outcome: Progressing Goal: RH STG MANAGE BOWEL W/MEDICATION W/ASSISTANCE Description: STG Manage Bowel with Medication with min Assistance. Outcome: Progressing   Problem: RH SKIN INTEGRITY Goal: RH STG SKIN FREE OF INFECTION/BREAKDOWN Description: Skin to remain free from breakdown while on rehab with min assist. Outcome: Progressing Goal: RH STG MAINTAIN SKIN INTEGRITY WITH ASSISTANCE Description: STG Maintain Skin Integrity With min Assistance. Outcome: Progressing Goal: RH STG ABLE TO PERFORM INCISION/WOUND CARE W/ASSISTANCE Description: STG Able To Perform Incision/Wound Care With min Assistance. Outcome: Progressing   Problem: RH SAFETY Goal: RH STG ADHERE TO SAFETY PRECAUTIONS W/ASSISTANCE/DEVICE Description: STG Adhere to Safety Precautions With min Assistance and appropriate assistive Device. Outcome: Progressing Goal: RH STG DECREASED RISK OF FALL WITH ASSISTANCE Description: STG Decreased Risk of Fall With min Assistance. Outcome: Progressing   Problem: RH PAIN MANAGEMENT Goal: RH STG PAIN MANAGED AT OR BELOW PT'S PAIN GOAL Description: <4 on a 0-10 pain scale Outcome: Progressing   Problem: RH KNOWLEDGE DEFICIT LIMB LOSS Goal: RH STG INCREASE KNOWLEDGE OF SELF CARE AFTER LIMB LOSS Description: Patient and caregiver will demonstrate knowledge of incision care, stump care, medication management, and follow up care with the MD post discharge with min assist from staff. Outcome: Progressing

## 2019-05-25 NOTE — Progress Notes (Signed)
Occupational Therapy Session Note  Patient Details  Name: Jose Garrett MRN: [id number] Date of Birth: 14-Dec-1950  Today's Date: 05/25/2019 OT Individual Time: 2010-0712 OT Individual Time Calculation (min): 60 min    Short Term Goals: Week 1:  OT Short Term Goal 1 (Week 1): Pt will complete toilet transfers with supervision OT Short Term Goal 2 (Week 1): Pt will complete LB dressing at sit > stand level with supervision OT Short Term Goal 3 (Week 1): Pt will complete toileting tasks with supervision  Skilled Therapeutic Interventions/Progress Updates:    Treatment session with focus on functional transfers, dynamic standing balance, and therex with RLE to promote increased ROM.  Pt completed lateral scoot transfer bed > w/c with supervision and improved trunk control.  Propelled w/c throughout session for UE strengthening.  Ambulatory transfers w/c <> therapy mat with CGA.  Engaged in RLE therex in supine, sidelying, and prone with focus on hip and knee extension.  Pt required increased time/effort to get in and out of prone.  Engaged in dynamic standing activity as needed for clothing management during dressing and toileting tasks.  Engaged in reaching for items on "card board" and crossing midline to place items on table to increase dynamic balance challenge.  Supervision when utilizing LUE to reach and CGA when reaching with RUE.  Returned to room as above and left upright in w/c with all needs in reach.  Therapy Documentation Precautions:  Precautions Precautions: Fall Precaution Comments: NWB RLE Required Braces or Orthoses: Knee Immobilizer - Right Knee Immobilizer - Right: On at all times Restrictions Weight Bearing Restrictions: Yes RLE Weight Bearing: Non weight bearing Pain: Pain Assessment Pain Scale: 0-10 Pain Score: 0-No pain   Therapy/Group: Individual Therapy  Simonne Come 05/25/2019, 12:17 PM

## 2019-05-26 ENCOUNTER — Inpatient Hospital Stay (HOSPITAL_COMMUNITY): Payer: Medicare Other | Admitting: Physical Therapy

## 2019-05-26 ENCOUNTER — Encounter (HOSPITAL_COMMUNITY): Payer: Medicare Other | Admitting: Occupational Therapy

## 2019-05-26 ENCOUNTER — Ambulatory Visit (HOSPITAL_COMMUNITY): Payer: Medicare Other

## 2019-05-26 ENCOUNTER — Inpatient Hospital Stay (HOSPITAL_COMMUNITY): Payer: Medicare Other

## 2019-05-26 NOTE — Progress Notes (Signed)
Jose Garrett Progress Note   Subjective:   Seen during PT, going well  HD yesterday, felt he was pulled too hard, 2L UF post weight 70.9kg   Objective Vitals:   05/25/19 1940 05/26/19 0447 05/26/19 0845 05/26/19 1314  BP: (!) 106/49 (!) 137/56 (!) 117/51 (!) 147/53  Pulse: 69 66 72 66  Resp: 18 16  18   Temp: 98.6 F (37 C) 98.3 F (36.8 C)  97.8 F (36.6 C)  TempSrc: Oral Oral    SpO2: 90% 97%  100%  Weight:  71 kg    Height:       Physical Exam General: Pleasant older male in NAD Heart: S1,S2 RRR Lungs: Ctab Abdomen:S, NT, ND Extremities: R BKA ace wrap no LLE edema Dialysis Access: R AVF + bruit   Additional Objective Labs: Basic Metabolic Panel: Recent Labs  Lab 05/21/19 0608 05/22/19 1244 05/25/19 1330  NA 133* 133* 136  K 3.9 4.4 4.6  CL 96* 94* 98  CO2 28 24 23   GLUCOSE 104* 109* 130*  BUN 14 29* 37*  CREATININE 4.00* 6.56* 8.63*  CALCIUM 8.4* 8.8* 9.3  PHOS 3.8 5.4* 6.4*   Liver Function Tests: Recent Labs  Lab 05/21/19 0608 05/22/19 1244 05/25/19 1330  ALBUMIN 2.5* 2.5* 2.7*   No results for input(s): LIPASE, AMYLASE in the last 168 hours. CBC: Recent Labs  Lab 05/20/19 1445 05/20/19 1445 05/21/19 0608 05/22/19 1249 05/25/19 1325  WBC 5.8   < > 5.9 5.2 5.4  HGB 7.7*   < > 8.3* 7.3* 7.9*  HCT 24.9*   < > 26.0* 23.6* 25.7*  MCV 103.3*  --  101.6* 103.5* 105.8*  PLT 119*   < > 125* 108* 105*   < > = values in this interval not displayed.   Blood Culture    Component Value Date/Time   SDES BLOOD LEFT ANTECUBITAL 05/14/2019 0522   SPECREQUEST AEROBIC BOTTLE ONLY Blood Culture adequate volume 05/14/2019 0522   CULT  05/14/2019 0522    NO GROWTH 5 DAYS Performed at [facility], [place] [address] [place], [place] [address]    REPTSTATUS 05/19/2019 FINAL 05/14/2019 0522    Cardiac Enzymes: No results for input(s): CKTOTAL, CKMB, CKMBINDEX, TROPONINI in the last 168 hours. CBG: No results for input(s):  GLUCAP in the last 168 hours. Iron Studies: No results for input(s): IRON, TIBC, TRANSFERRIN, FERRITIN in the last 72 hours. @lablastinr3 @ Studies/Results: No results found. Medications: . vancomycin 750 mg (05/25/19 1603)   . amiodarone  200 mg Oral Daily  . atorvastatin  80 mg Oral Q supper  . calcitRIOL  1.5 mcg Oral Q M,W,F-HD  . carvedilol  3.125 mg Oral BID  . Chlorhexidine Gluconate Cloth  6 each Topical Q0600  . clopidogrel  75 mg Oral Q supper  . darbepoetin (ARANESP) injection - DIALYSIS  100 mcg Intravenous Q Fri-HD  . docusate sodium  100 mg Oral BID  . heparin  5,000 Units Subcutaneous Q8H  . Melatonin  1.5 mg Oral QHS  . midodrine  10 mg Oral Q M,W,F-HD  . multivitamin  1 tablet Oral QHS  . pantoprazole  40 mg Oral Daily  . sucroferric oxyhydroxide  1,500 mg Oral TID WC  . vitamin B-12  1,000 mcg Oral QPM     Dialysis Orders: MWF [facility] 4h 400/800 76.5kg 2/2 bath P4 RUE AVF  -Heparin  2400 units IV TIW - Parsabiv 5mg  IV q HD - Calcitriol 1.73mcg PO q HD -  Mircera 69mcg IV q 2 weeks (last given 57mcg on 1/20, Hgb 10.3 on 2/10)  Assessment/Plan: 1. PAD w/ gangrenous changes -s/p R TMA on 2/1 healing poorly so s/p R BKA on 2/25. Now on CIR. 2. Sepsis: +BC w/enterococcus, onvanomycin&improving.Plan for vanc for 4 weeks after last negative culturewhich was 05/14/19.Etiology unclear, incision sites clean per VVS, CXR w/o active dz, no urine available to culture. ID following. TEE shows normal appearing AV prothesis, no vegetation.Clinically improved, remains on vancomycin. 3. ESRD- onMWF HD.K+ controlled. Next HD 05/27/19 use2K bath.EDW 71kg? No more than 1.5L UF.  AVF 400/80 4. Anemiaof CKD-HGB down to 7.9;Aranesp 100 mcg IV q Friday. tsat 35%, no Fe needed. Follow HGB,Transfuse prn. 5. Secondary hyperparathyroidism- Calcium controlled, phosnow at goalw/increased Velphoro dose (3/2). Continue binders and VDRA.  6. HTN/volume-Blood  pressure controlled. Euvolemic on exam 7. Nutrition- Alb 2.5, Renal diet w/fluid restrictions, protein supplements, Vit 8. Hx TAVR 9. CAD/HFrEF: euvolemic  Jose Garrett  05/26/2019, 3:19 PM  [facility]

## 2019-05-26 NOTE — Progress Notes (Signed)
Physical Therapy Session Note  Patient Details  Name: Jose Garrett MRN: [id number] Date of Birth: 01/10/1951  Today's Date: 05/26/2019 PT Individual Time: 2229-7989 and 2119-4174 PT Individual Time Calculation (min): 30 min and 39 min Today's Date: 05/26/2019 PT Missed Time: 15 Minutes Missed Time Reason: Toileting  Short Term Goals: Week 1:  PT Short Term Goal 1 (Week 1): Pt will perform bed mobility with supervision PT Short Term Goal 2 (Week 1): Pt will transfer bed<>chair with LRAD CGA PT Short Term Goal 3 (Week 1): Pt will ambulate 33ft with LRAD CGA  Skilled Therapeutic Interventions/Progress Updates:   Treatment Session 1: 0814-4818 30 min Received pt sitting on bedside commode, pt requesting for therapist to return in a few minutes. Upon returning, pt stated he was not done yet and requested a few more minutes. When therapist returned for second time, pt agreeable to therapy, and denied any pain during session. Session focused on functional mobility/transfers, curb navigation, LE/UE strength, and improved activity tolerance. Pt with increased time spent on bedside commode limiting therapy time. Pt required set up assist for hygiene management. Pt transferred bedside commode<>WC with supervision. Pt transported to gym in [facility] total assist for time management purposes. Practiced bumping up/down curb in [place] x1 trial total assist. Pt verbalized confidence with task and declined practicing a second time. Pt transferred WC<> mat without AD and supervision. Pt performed seated chest press with 4.4lb medine ball 1x8 and 1x10 and seated trunk rotation with 4.4lb medicine ball x10. Pt transferred mat<>WC with supervision and was transported back to room total assist. Concluded session with pt sitting in [facility], needs within reach, and RN present assessing vitals. 15 minutes missed of skilled physical therapy due to toileting.   Treatment Session 2: 1445-1524 39 min Received pt sitting in WC, pt  agreeable to therapy, and denied any pain during session. Session focused on functional mobility/transfers, LE/UE strength, simulated car transfers, curb navigation, ambulation, amputee education, and improved activity tolerance. Pt's daughter present for family education training. Pt performed WC mobility 190ft using bilateral UEs independently. Pt performed simulated car transfer with supervision using lateral scoot technique. Pt required verbal cues for technique and safety. Pt/pt's daughter verbalized confidence with task and declined practicing again. Pt performed WC mobility 75ft to therapy gym. Pt navigated 1 curb x 2 trials bumping up/down in WC total assist. Trial 1: with PT Trial 2: with daughter. Pt's daughter verbalized and demonstrated confidence bumping up/down in WC. Pt ambulated 63ft with RW CGA. Pt's daughter educated on body mechanics and importance of remaining on pt's R side; pt and pt's daughter verbalized understanding. Pt's daughter was educated on importance of pt spending time in prone position to facilitate hip extension to prepare R residual limb for prosthetic; pt's daughter in agreement. Pt performed WC mobility 144ft using bilateral UE independently back to room. Concluded session with pt sitting in WC, needs within reach, and chair pad alarm on. Therapist provided pt with drink.   Therapy Documentation Precautions:  Precautions Precautions: Fall Precaution Comments: NWB RLE Required Braces or Orthoses: Knee Immobilizer - Right Knee Immobilizer - Right: On at all times Restrictions Weight Bearing Restrictions: Yes RLE Weight Bearing: Non weight bearing   Therapy/Group: Individual Therapy  Blenda Nicely 05/26/2019, 7:25 AM

## 2019-05-26 NOTE — Progress Notes (Signed)
Physical Therapy Session Note  Patient Details  Name: Jose Garrett MRN: [id number] Date of Birth: 1950/07/12  Today's Date: 05/26/2019 PT Individual Time: 0932-1026 PT Individual Time Calculation (min): 54 min   Short Term Goals: Week 1:  PT Short Term Goal 1 (Week 1): Pt will perform bed mobility with supervision PT Short Term Goal 2 (Week 1): Pt will transfer bed<>chair with LRAD CGA PT Short Term Goal 3 (Week 1): Pt will ambulate 22ft with LRAD CGA  Skilled Therapeutic Interventions/Progress Updates:  Pt received in bed & agreeable to tx. Pt completes lateral scoot bed<>w/c, w/c<>nu-step with supervision. Pt requires cuing for w/c parts management, especially armrests during lateral scoot transfers. W/c mobility around unit with BUE & distant supervision. Therapist re-wrapped RLE 2/2 lose ace wrap & windows. Sit<>stand with min assist & pt ambulates 20 ft + 28 ft with RW & min assist with decreasing LLE foot clearance as pt fatigues. Nu-step on level 3 x 10 minutes with BUE & LLE with focus on global strengthening & endurance training as pt with c/o feeling "weak" throughout session. Back in room therapist assisted pt with donning R knee brace & educated pt on importance of maintaining knee extension for future prosthetic use. Pt left in bed with alarm set, call bell & all needs in reach.  Therapy Documentation Precautions:  Precautions Precautions: Fall Precaution Comments: NWB RLE Required Braces or Orthoses: Knee Immobilizer - Right Knee Immobilizer - Right: On at all times Restrictions Weight Bearing Restrictions: Yes RLE Weight Bearing: Non weight bearing   Pain: Pt denied c/o pain.    Therapy/Group: Individual Therapy  Waunita Schooner 05/26/2019, 10:32 AM

## 2019-05-26 NOTE — Discharge Summary (Signed)
Physician Discharge Summary  Patient ID: VISHRUTH SEOANE MRN: [id number] DOB/AGE: [birth year] 67 y.o.  Admit date: 05/18/2019 Discharge date: 05/28/2019  Discharge Diagnoses:  Principal Problem:   Right below-knee amputee [facility]) Active Problems:   Orthostatic hypotension   Acute on chronic anemia   Chronic combined systolic and diastolic congestive heart failure (HCC)   ESRD on dialysis ([place])   Bacteremia   Sleep disturbance   Nausea without vomiting Enterococcal bacteremia CAD/TAVR Hyperlipidemia  Discharged Condition: Stable  Significant Diagnostic Studies: X-ray chest PA or AP  Result Date: 05/11/2019 CLINICAL DATA:  Fever. EXAM: CHEST  1 VIEW COMPARISON:  10/21/2018 in CT chest 09/30/2018. FINDINGS: No acute findings. Aortic valve replacement. Thoracic aorta is calcified. Lungs are somewhat hyperinflated but clear. No pleural fluid. IMPRESSION: No acute findings. Electronically Signed   By: Lorin Picket M.D.   On: 05/11/2019 14:28   DG Abd 2 Views  Result Date: 05/02/2019 CLINICAL DATA:  Nausea and vomiting. EXAM: ABDOMEN - 2 VIEW COMPARISON:  None. FINDINGS: No free intra-abdominal air. No bowel dilatation to suggest obstruction. Air within nondistended transverse colon. Small volume of colonic stool. Surgical clips in the right upper quadrant from cholecystectomy. Soft tissue calcification in the left pelvis is likely a phlebolith. There are vascular calcifications. Postsurgical change in the right groin. No acute osseous abnormalities are seen. IMPRESSION: Normal bowel gas pattern. No free air. Electronically Signed   By: Keith Rake M.D.   On: 05/02/2019 19:42   ECHOCARDIOGRAM COMPLETE  Result Date: 05/13/2019    ECHOCARDIOGRAM REPORT   Patient Name:   Jose Garrett Date of Exam: 05/13/2019 Medical Rec #:  [id number]      Height:       70.0 in Accession #:    [id number]     Weight:       151.0 lb Date of Birth:  10-Dec-1950      BSA:          1.852 m Patient Age:    69 years        BP:           127/58 mmHg Patient Gender: M              HR:           63 bpm. Exam Location:  Inpatient Procedure: 2D Echo, Cardiac Doppler and Color Doppler Indications:    Bacteremia [790.7.ICD-9-CM]  History:        Patient has prior history of Echocardiogram examinations, most                 recent 11/11/2018. CAD; Risk Factors:Former Smoker. PAD.                 Aortic Valve: 26 mm Edwards Sapien prosthetic, stented (TAVR)                 valve is present in the aortic position. Procedure Date:                 10/21/2018.  Sonographer:    Vickie Epley RDCS Referring Phys: [id number] [place]  1. Left ventricular ejection fraction, by estimation, is 55%. The left ventricle has normal function. The left ventricle demonstrates regional wall motion abnormalities (see scoring diagram/findings for description). There is mild left ventricular hypertrophy. Left ventricular diastolic parameters are consistent with Grade I diastolic dysfunction (impaired relaxation).  2. Right ventricular systolic function is normal. The right ventricular size is normal.  3. Left  atrial size was mildly dilated.  4. The mitral valve is moderately thickened/calcified but no obvious vegetation. Hwever TTE is insensitive for endocarditis. If clinic suspicion is high, recommend TEE. The mitral valve is abnormal. No evidence of mitral valve regurgitation.  5. The aortic valve is normal in structure and function. Aortic valve regurgitation is trivial. There is a 26 mm Edwards Sapien prosthetic (TAVR) valve present in the aortic position. Procedure Date: 10/21/2018. No obvious vegetation seen.  6. The inferior vena cava is normal in size with greater than 50% respiratory variability, suggesting right atrial pressure of 3 mmHg. FINDINGS  Left Ventricle: Left ventricular ejection fraction, by estimation, is 55%. The left ventricle has normal function. The left ventricle demonstrates regional wall motion abnormalities. Moderate  hypokinesis of the left ventricular, basal-mid inferior wall and inferolateral wall. The left ventricular internal cavity size was normal in size. There is mild left ventricular hypertrophy. Left ventricular diastolic parameters are consistent with Grade I diastolic dysfunction (impaired relaxation).  LV Wall Scoring: The basal inferior segment is hypokinetic. Right Ventricle: The right ventricular size is normal. No increase in right ventricular wall thickness. Right ventricular systolic function is normal. Left Atrium: Left atrial size was mildly dilated. Right Atrium: Right atrial size was normal in size. Pericardium: There is no evidence of pericardial effusion. Mitral Valve: The mitral valve is moderately thickened/calcified but no obvious vegetation. Hwever TTE is insensitive for endocarditis. If clinic suspicion is high, recommend TEE. The mitral valve is abnormal. There is moderate thickening of the mitral valve leaflet(s). No evidence of mitral valve regurgitation. Tricuspid Valve: The tricuspid valve is normal in structure. Tricuspid valve regurgitation is trivial. Aortic Valve: The aortic valve is normal in structure and function. Aortic valve regurgitation is trivial. Aortic valve mean gradient measures 11.0 mmHg. Aortic valve peak gradient measures 25.6 mmHg. There is a 26 mm Edwards Sapien prosthetic, stented (TAVR) valve present in the aortic position. Procedure Date: 10/21/2018. Pulmonic Valve: The pulmonic valve was normal in structure. Pulmonic valve regurgitation is trivial. Aorta: The aortic root and ascending aorta are structurally normal, with no evidence of dilitation. Venous: The inferior vena cava is normal in size with greater than 50% respiratory variability, suggesting right atrial pressure of 3 mmHg. IAS/Shunts: No atrial level shunt detected by color flow Doppler.  LEFT VENTRICLE PLAX 2D LVIDd:         4.90 cm      Diastology LVIDs:         3.50 cm      LV e' lateral:   7.97 cm/s LV PW:          1.20 cm      LV E/e' lateral: 12.8 LV IVS:        1.20 cm      LV e' medial:    4.64 cm/s                             LV E/e' medial:  22.0  LV Volumes (MOD) LV vol d, MOD A2C: 173.0 ml LV vol d, MOD A4C: 157.0 ml LV vol s, MOD A2C: 91.7 ml LV vol s, MOD A4C: 73.8 ml LV SV MOD A2C:     81.3 ml LV SV MOD A4C:     157.0 ml LV SV MOD BP:      81.0 ml RIGHT VENTRICLE RV S prime:     9.55 cm/s TAPSE (M-mode): 1.9 cm LEFT ATRIUM  Index       RIGHT ATRIUM           Index LA diam:      3.30 cm 1.78 cm/m  RA Area:     12.60 cm LA Vol (A2C): 47.4 ml 25.59 ml/m RA Volume:   30.10 ml  16.25 ml/m LA Vol (A4C): 43.5 ml 23.48 ml/m  AORTIC VALVE AV Vmax:           253.00 cm/s AV Vmean:          148.000 cm/s AV VTI:            0.456 m AV Peak Grad:      25.6 mmHg AV Mean Grad:      11.0 mmHg LVOT Vmax:         150.00 cm/s LVOT Vmean:        80.200 cm/s LVOT VTI:          0.236 m LVOT/AV VTI ratio: 0.52 MITRAL VALVE MV Area (PHT): 2.01 cm     SHUNTS MV Decel Time: 377 msec     Systemic VTI: 0.24 m MV E velocity: 102.00 cm/s MV A velocity: 116.00 cm/s MV E/A ratio:  0.88 Glori Bickers MD Electronically signed by Glori Bickers MD Signature Date/Time: 05/13/2019/7:16:49 PM    Final    ECHO TEE  Result Date: 05/14/2019    TRANSESOPHOGEAL ECHO REPORT   Patient Name:   Jose Garrett Date of Exam: 05/14/2019 Medical Rec #:  [id number]      Height:       70.0 in Accession #:    [id number]     Weight:       147.9 lb Date of Birth:  Mar 10, 1951      BSA:          1.836 m Patient Age:    68 years       BP:           110/39 mmHg Patient Gender: M              HR:           61 bpm. Exam Location:  Inpatient Procedure: Transesophageal Echo, Cardiac Doppler and Color Doppler Indications:     Bacteremia  History:         Patient has prior history of Echocardiogram examinations, most                  recent 05/13/2019. CHF, CAD, PAD, Aortic Valve Disease,                  Signs/Symptoms:Bacteremia; Risk Factors:Hypertension  and                  Dyslipidemia. ESRD.                  Aortic Valve: 26 mm Edwards Edwards Sapien prosthetic, stented                  (TAVR) valve is present in the aortic position. Procedure Date:                  10/21/2018.  Sonographer:     Dustin Flock Referring Phys:  [id number] [place] Diagnosing Phys: Dorris Carnes MD PROCEDURE: The transesophogeal probe was passed without difficulty through the esophogus of the patient. Sedation performed by performing physician. The patient was monitored while under deep sedation. Anesthestetic sedation was provided intravenously by  Anesthesiology: 90mg  of Propofol. The  patient developed no complications during the procedure. IMPRESSIONS  1. No obvious vegetations.  2. Left ventricular ejection fraction, by estimation, is 55 to 60%. The left ventricle has normal function.  3. Right ventricular systolic function is normal.  4. No left atrial/left atrial appendage thrombus was detected.  5. The aortic valve is grossly normal. Aortic valve regurgitation is not visualized. There is a 26 mm Edwards Edwards Sapien prosthetic (TAVR) valve present in the aortic position. Procedure Date: 10/21/2018. Echo findings are consistent with normal structure and function of the aortic valve prosthesis. FINDINGS  Left Ventricle: Left ventricular ejection fraction, by estimation, is 55 to 60%. The left ventricle has normal function. The left ventricle has no regional wall motion abnormalities. The left ventricular internal cavity size was normal in size. There is  no left ventricular hypertrophy. Right Ventricle: The right ventricular size is normal. Right vetricular wall thickness was not assessed. Right ventricular systolic function is normal. Left Atrium: Left atrial size was normal in size. No left atrial/left atrial appendage thrombus was detected. Right Atrium: Right atrial size was normal in size. Pericardium: There is no evidence of pericardial effusion. Mitral Valve: The  mitral valve is abnormal. There is mild thickening of the mitral valve leaflet(s). Trivial mitral valve regurgitation. Tricuspid Valve: The tricuspid valve is normal in structure. Tricuspid valve regurgitation is trivial. Aortic Valve: The aortic valve is grossly normal. Aortic valve regurgitation is not visualized. There is a 26 mm Edwards Edwards Sapien prosthetic, stented (TAVR) valve present in the aortic position. Procedure Date: 10/21/2018. Echo findings are consistent with normal structure and function of the aortic valve prosthesis. Pulmonic Valve: The pulmonic valve was grossly normal. Pulmonic valve regurgitation is not visualized. Aorta: The aortic root is normal in size and structure. There is mild (Grade II) plaque. IAS/Shunts: No atrial level shunt detected by color flow Doppler. Dorris Carnes MD Electronically signed by Dorris Carnes MD Signature Date/Time: 05/14/2019/11:33:16 PM    Final     Labs:  Basic Metabolic Panel: Recent Labs  Lab 05/21/19 0608 05/22/19 1244 05/25/19 1330 05/27/19 1457  NA 133* 133* 136 135  K 3.9 4.4 4.6 4.1  CL 96* 94* 98 95*  CO2 28 24 23 25   GLUCOSE 104* 109* 130* 116*  BUN 14 29* 37* 27*  CREATININE 4.00* 6.56* 8.63* 7.39*  CALCIUM 8.4* 8.8* 9.3 9.1  PHOS 3.8 5.4* 6.4* 5.8*    CBC: Recent Labs  Lab 05/22/19 1249 05/25/19 1325 05/27/19 1457  WBC 5.2 5.4 4.5  HGB 7.3* 7.9* 8.2*  HCT 23.6* 25.7* 26.5*  MCV 103.5* 105.8* 103.5*  PLT 108* 105* 97*    CBG: No results for input(s): GLUCAP in the last 168 hours.  Family history.  Mother with hypertension, diabetes mellitus, CAD and skin cancer.  Father with hypertension diabetes.  Denies any esophageal cancer, colon cancer or rectal cancer  Brief HPI:   YANNICK STEUBER is a 69 y.o. right-handed male with history of CAD status post TAVR procedure August 2020 maintained on Plavix as well as amiodarone, chronic combined systolic and diastolic congestive heart failure, end-stage renal disease with  hemodialysis, hyperlipidemia, quit smoking 8 years ago.  Patient did receive inpatient rehab services 07/28/2018 to 08/13/2018 for debility related to PAD status post left common femoral to below-knee popliteal artery bypass discharged home contact-guard assist.  Presented 04/29/2019 for gangrenous change of the right foot as well as recent right lower extremity bypass and no change with conservative care undergoing right transmetatarsal  amputation 04/29/2019 per Dr. Donzetta Matters.  Postoperatively with plantar flap becoming necrotic returning back to the OR for revision of right transmetatarsal amputation 05/05/2019 unfortunately with progressive gangrenous necrotic changes and ultimately with right BKA 05/07/2019 per Dr. Trula Slade.  Hemodialysis ongoing per renal services.  Patient doing well until 05/11/2019 isolated fever 103 blood cultures Enterococcus placed on vancomycin x4 weeks through 06/10/2019.  Infectious disease consulted TEE evaluation no vegetation no valvular disease.  Acute on chronic anemia 7.7 and monitored.  Subcutaneous heparin for DVT prophylaxis.  Patient was admitted for a comprehensive rehab program.   Hospital Course: SHOJI PERTUIT was admitted to rehab 05/18/2019 for inpatient therapies to consist of PT, ST and OT at least three hours five days a week. Past admission physiatrist, therapy team and rehab RN have worked together to provide customized collaborative inpatient rehab.  Pertaining to patient right BKA 05/07/2019 after failed transmetatarsal amputation surgical site healing nicely stump shrinker as advised he would follow-up vascular surgery.  Subcutaneous heparin for DVT prophylaxis he remained on Plavix prior to admission no bleeding episodes.  Pain managed with use of oxycodone and good results.  He would remain on vancomycin through 06/10/2019 for Enterococcus bacteremia per infectious disease patient remained afebrile.  Hemodialysis as per renal services.  Noted history of CAD with TAVR procedure  August 2020 again he remained on Plavix as well as amiodarone as directed.  He exhibited no signs of fluid overload he continued on low-dose Coreg.  Acute on chronic anemia Aranesp as directed.  Close monitoring of blood pressure he was maintained on ProAmatine Monday Wednesday Fridays with hemodialysis.   Blood pressures were monitored on TID basis and somewhat soft but controlled  He/ is continent of bowel and bladder.  He/ has made gains during rehab stay and is attending therapies  He/ will continue to receive follow up therapies   after discharge  Rehab course: During patient's stay in rehab weekly team conferences were held to monitor patient's progress, set goals and discuss barriers to discharge. At admission, patient required minimal assist ambulate 10 feet rolling walker, moderate assist stand pivot transfers, min mod assist sit stand.  Set up upper body bathing mod assist lower body bathing set up upper body dressing max assist lower body dressing  Physical exam.  Blood pressure 126/43 pulse 62 temperature 97.7 respirations 18 oxygen saturation 90% room air Constitutional well-developed well-nourished HEENT Head.  Normocephalic and atraumatic Neck.  Supple nontender no JVD without thyromegaly Cardiac regular rate rhythm without any extra sounds or murmur heard Abdomen.  Soft nontender positive bowel sounds without rebound Respiratory effort normal no respiratory distress without wheeze Skin.  Right BKA site dressed appropriately tender left second and third toe amputations well-healed Neurological.  Alert and oriented.  Cranial nerves II through XII intact motor strength 5 out of 5 bilateral deltoid bicep tricep grip 4 out of 5 left hip flexor, knee extensors, ankle dorsiflexor and plantar flexion, 4 out of 5 right hip flexor abduction and adduction  He/  has had improvement in activity tolerance, balance, postural control as well as ability to compensate for deficits. He/ has had  improvement in functional use RUE/LUE  and RLE/LLE as well as improvement in awareness.  Patient completes lateral scoot bed to wheelchair wheelchair to NuStep with supervision.  Requires some cues for wheelchair parts.  Wheelchair mobility around unit distant supervision.  Ambulates 20 to 28 feet rolling walker minimal assist.  Sit to stand with minimal assist. Engaged in lateral  scoot and ambulatory transfers for ADLs and home environment.  Completed tub shower and toilet transfers ambulate with rolling walker close supervision.  Patient's daughter arrived during bathroom transfers therefore completed again to allow daughter to observe.  Full family teaching completed plan discharge to home.      Disposition: Discharge to home    Diet: Renal diet  Special Instructions: No driving smoking or alcohol  Continue hemodialysis as directed  Medications at discharge 1.  Tylenol as needed 2.  Amiodarone 200 mg p.o. daily 3.  Lipitor 80 mg p.o. daily 4.  Rocaltrol 1.5 mcg Monday Wednesday Friday hemodialysis 5.  Coreg 3.125 mg p.o. twice daily 6.  Plavix 75 mg p.o. daily 7.  Aranesp weekly with hemodialysis 8.  Colace 100 mg p.o. twice daily 9.  Ativan 1 mg p.o. twice daily as needed anxiety 10.  Melatonin 1.5 mg p.o. nightly 11.  Robaxin 500 mg p.o. twice daily as needed muscle spasms 12.  ProAmatine 10 mg p.o. Monday Wednesday Friday hemodialysis 13.  Multivitamin daily 14.  Oxycodone 5 to 10 mg every 4 hours as needed moderate pain 15.  Protonix 40 mg p.o. daily 6.velphoro 1500 mg p.o. 3 times daily 16.  Vancomycin 750 mg every Monday Wednesday Friday hemodialysis through 06/10/2019 and stop 17.  Vitamin B12 1000 mcg p.o. every evening   Discharge Instructions    Ambulatory referral to Physical Medicine Rehab   Complete by: As directed    Moderate complexity follow-up 1 to 2 weeks right BKA      Follow-up Information    Jamse Arn, MD Follow up.   Specialty: Physical  Medicine and Rehabilitation Why: Office to call for appointment Contact information: [address] [place] [address] [phone]        Serafina Mitchell, MD Follow up.   Specialties: Vascular Surgery, Cardiology Why: Call for appointment Contact information: [place] [address] [phone]        Carlyle Basques, MD Follow up.   Specialty: Infectious Diseases Why: Call for appointment Contact information: [place] [address] [phone]        Madelon Lips, MD Follow up.   Specialty: Nephrology Why: Call for appointment Contact information: [place] [address] [phone]           Signed: Cathlyn Parsons 05/28/2019, 4:57 AM

## 2019-05-26 NOTE — Progress Notes (Signed)
Routed Orthopedic Tech Progress Note Patient Details:  Jose Garrett [birth year] [id number]  Patient ID: Jose Garrett, male   DOB: [birth year], 69 y.o.   MRN: [id number]   Jose Garrett 05/26/2019, 3:33 PMRouted brace order to hanger.

## 2019-05-26 NOTE — Progress Notes (Signed)
Patient refused 1400 scheduled heparin injection. This nurse educated patient on risks and complications of refusal . Patient verbalized understanding.

## 2019-05-26 NOTE — Progress Notes (Signed)
   I evaluated patient in rehab.  Okay for stump shrinker.  I will reschedule his office appointment for 3 to 4 weeks down the road.  Angelyne Terwilliger C. Donzetta Matters, MD [facility] Office: [phone] Pager: [phone]

## 2019-05-26 NOTE — Progress Notes (Signed)
[place] PHYSICAL MEDICINE & REHABILITATION PROGRESS NOTE  Subjective/Complaints: Feeling well this morning. Had BM this morning.  Denies pain currently. Had some residual pain last night and received Oxycodone which helped.    ROS:  Pt denies SOB, abd pain, CP, N/V/C/D, and vision changes  Objective: Vital Signs: Blood pressure (!) 117/51, pulse 72, temperature 98.3 F (36.8 C), temperature source Oral, resp. rate 16, height 5\' 10"  (1.778 m), weight 71 kg, SpO2 97 %. No results found. Recent Labs    05/25/19 1325  WBC 5.4  HGB 7.9*  HCT 25.7*  PLT 105*   Recent Labs    05/25/19 1330  NA 136  K 4.6  CL 98  CO2 23  GLUCOSE 130*  BUN 37*  CREATININE 8.63*  CALCIUM 9.3    Physical Exam: BP (!) 117/51 (BP Location: Left Arm)   Pulse 72   Temp 98.3 F (36.8 C) (Oral)   Resp 16   Ht 5\' 10"  (1.778 m)   Wt 71 kg   SpO2 97%   BMI 22.46 kg/m  Constitutional: No distress . Sitting in [place] comfortably.  HENT: Normocephalic.  Atraumatic. Eyes: conjugate gaze Cardiovascular: no JVD Respiratory: no resp distress; no accessory muscle use; breathing normal rate GI: soft, Nondistended Skin: Warm and dry.  Dressing over R BKA- canot assess Psych: appropriate; talkative Musc: Left lower extremity digits 2,3 amputations Neurologic:Ox3 Motor: Bilateral upper extremities: 4+/5 proximal distal Right lower extremity: Hip flexion 4/5, stable Left lower extremity: 4/5 proximal distal, stable  Assessment/Plan: 1. Functional deficits secondary to right BKA which require 3+ hours per day of interdisciplinary therapy in a comprehensive inpatient rehab setting.  Physiatrist is providing close team supervision and 24 hour management of active medical problems listed below.  Physiatrist and rehab team continue to assess barriers to discharge/monitor patient progress toward functional and medical goals  Care Tool:  Bathing    Body parts bathed by patient: Right arm, Left arm,  Chest, Abdomen, Front perineal area, Buttocks, Right upper leg, Left upper leg, Face     Body parts n/a: Right lower leg   Bathing assist Assist Level: Contact Guard/Touching assist     Upper Body Dressing/Undressing Upper body dressing   What is the patient wearing?: Button up shirt    Upper body assist Assist Level: Set up assist    Lower Body Dressing/Undressing Lower body dressing      What is the patient wearing?: Underwear/pull up, Pants     Lower body assist Assist for lower body dressing: Minimal Assistance - Patient > 75%     Toileting Toileting    Toileting assist Assist for toileting: Minimal Assistance - Patient > 75% Assistive Device Comment: bsc   Transfers Chair/bed transfer  Transfers assist     Chair/bed transfer assist level: Contact Guard/Touching assist     Locomotion Ambulation   Ambulation assist      Assist level: Minimal Assistance - Patient > 75% Assistive device: Walker-rolling Max distance: 20'   Walk 10 feet activity   Assist     Assist level: Minimal Assistance - Patient > 75% Assistive device: Walker-rolling   Walk 50 feet activity   Assist Walk 50 feet with 2 turns activity did not occur: Safety/medical concerns         Walk 150 feet activity   Assist Walk 150 feet activity did not occur: Safety/medical concerns(fatigue, balance/postural control, R BKA)         Walk 10 feet on uneven surface  activity   Assist Walk 10 feet on uneven surfaces activity did not occur: Safety/medical concerns(fatigue, balance/postural control, R BKA)         Wheelchair     Assist Will patient use wheelchair at discharge?: Yes Type of Wheelchair: Manual    Wheelchair assist level: Supervision/Verbal cueing Max wheelchair distance: >200'    Wheelchair 50 feet with 2 turns activity    Assist        Assist Level: Supervision/Verbal cueing   Wheelchair 150 feet activity     Assist     Assist  Level: Supervision/Verbal cueing      Medical Problem List and Plan: 1.Decreased functional mobilitysecondary to right BKA 2/25/2021after failed transmetatarsal amputation and recent right lower extremity bypass  Continue CIR 2. Antithrombotics: -DVT/anticoagulation:Subcutaneous heparin -antiplatelet therapy: Plavix 75 mg daily 3. Pain Management:Robaxin oxycodone as needed  Controlled with medications on 3/12  3/13- back pain is an issue- usually takes oxycodone 5-10 mg- but tries not to take regularly- recliner and back pain a problems during HD  3/15-3/16: stable  Monitor with increased exertion 4. Mood:Ativan as needed -antipsychotic agents: N/A 5. Neuropsych: This patientiscapable of making decisions on hisown behalf. 6. Skin/Wound Care:Routine skin checks 7. Fluids/Electrolytes/Nutrition:Routine in and outs. 8. ID/enterococcal bacteremia. Follow-up infectious disease. Continue IV vancomycin x4 weekswith HD to be completed 06/10/2019  Level within normal limits on 3/12 9. ESRD:Continue hemodialysis as directed  3/13- M/W/F 10. CAD/TAVRAugust 2020. Continue Plavix as well as amiodarone 200 mg daily as directed. 11. Chronic combined systolic and diastolic congestive heart failure. Monitor for any signs of fluid overload.Continue Coreg 3.125 mg twice daily Filed Weights   05/25/19 1308 05/25/19 1716 05/26/19 0447  Weight: 72.9 kg 70.9 kg 71 kg   3/14- renal added fluid restriction due to weight gain last 2 days- from 69.3 to 71.8 in last 2 days.   3/15: weight increase to 73.3kg. Continue to monitor. Has HD today.   3/16: stable 12. Acute on chronic anemia. Continue Aranesp.   Hemoglobin 8.3 on 3/11  Labs with HD today  3/12- Hb down to 7.3- asymptomatic- per renal if needs transfusion.  13.Orthostasis. ProAmatine 10 mg Monday Wednesday Friday with hemodialysis.   Blood pressure labile with HD. Continues to be labile.    Monitor with increased mobility 14. Hyperlipidemia. Lipitor 15.  Thrombocytopenia  Platelets 125 on 2/11, labs with HD today  Continue to monitor 16.  Sleep disturbance  Melatonin started on 3/11, encouraged trial  3/13- slept well  17.  Intermittent transient nausea-likely multifactorial: Pain meds +/- IV antibiotics +/- bacteremia +/- HD  Improving 18. Constipation  3/13- asking for stool sotener- declined laxative- will see how going and add Prn meds if needed  3/14- had good BM- denies constipation now  3/15: Had hard stool with straining last night. Increased Colace to BID.  19. Nausea  3/14- added compazine to med list as needed   LOS: 8 days A FACE TO [place] K[place]ri [place] 05/26/2019, 9:23 AM

## 2019-05-26 NOTE — Progress Notes (Signed)
Occupational Therapy Session Note  Patient Details  Name: Jose Garrett MRN: [id number] Date of Birth: 1951/02/15  Today's Date: 05/26/2019 OT Individual Time: 1345-1436 OT Individual Time Calculation (min): 51 min    Short Term Goals: Week 1:  OT Short Term Goal 1 (Week 1): Pt will complete toilet transfers with supervision OT Short Term Goal 2 (Week 1): Pt will complete LB dressing at sit > stand level with supervision OT Short Term Goal 3 (Week 1): Pt will complete toileting tasks with supervision  Skilled Therapeutic Interventions/Progress Updates:    Treatment session with focus on functional transfers and family education with pt's daughter.  Pt's daughter schedule to arrive at 1400, did not arrive until 65.  Engaged in lateral scoot and ambulatory transfers with focus on increased safety in home environment.  Min question cues for w/c parts management and positioning prior to squat pivot/lateral scoot transfers for improved safety.  Completed tub/shower and toilet transfers ambulating with RW with close supervision.  Pt's daughter arrived during bathroom transfers, therefore completed again to allow daughter to observe.  Engaged in discussion regarding recommendation for supervision when ambulating and question if w/c will fit in bathroom.  Pt will require supervision for transfers in/out of shower and when ambulating to toilet in bathroom, but can complete lateral scoot transfers to drop arm BSC with Supervision - approaching Mod I.  Pt's daughter pleased with recommendation for drop arm BSC.  Discussed application of shrinker and care for residual limb - referring Next Steps publication.  Pt returned to room and left upright in w/c awaiting next therapy session.  Therapy Documentation Precautions:  Precautions Precautions: Fall Precaution Comments: NWB RLE Required Braces or Orthoses: Knee Immobilizer - Right Knee Immobilizer - Right: On at all times Restrictions Weight Bearing  Restrictions: Yes RLE Weight Bearing: Non weight bearing General: General PT Missed Treatment Reason: Toileting Vital Signs: Therapy Vitals Temp: 97.8 F (36.6 C) Pulse Rate: 66 Resp: 18 BP: (!) 147/53 Oxygen Therapy SpO2: 100 % Pain:  Pt with no c/o pain   Therapy/Group: Individual Therapy  Simonne Come 05/26/2019, 3:31 PM

## 2019-05-27 ENCOUNTER — Other Ambulatory Visit (HOSPITAL_COMMUNITY): Payer: Self-pay | Admitting: Physician Assistant

## 2019-05-27 ENCOUNTER — Inpatient Hospital Stay (HOSPITAL_COMMUNITY): Payer: Medicare Other | Admitting: Occupational Therapy

## 2019-05-27 ENCOUNTER — Inpatient Hospital Stay (HOSPITAL_COMMUNITY): Payer: Medicare Other

## 2019-05-27 LAB — RENAL FUNCTION PANEL
Albumin: 2.7 g/dL — ABNORMAL LOW (ref 3.5–5.0)
Anion gap: 15 (ref 5–15)
BUN: 27 mg/dL — ABNORMAL HIGH (ref 8–23)
CO2: 25 mmol/L (ref 22–32)
Calcium: 9.1 mg/dL (ref 8.9–10.3)
Chloride: 95 mmol/L — ABNORMAL LOW (ref 98–111)
Creatinine, Ser: 7.39 mg/dL — ABNORMAL HIGH (ref 0.61–1.24)
GFR calc Af Amer: 8 mL/min — ABNORMAL LOW (ref >60)
GFR calc non Af Amer: 7 mL/min — ABNORMAL LOW (ref >60)
Glucose, Bld: 116 mg/dL — ABNORMAL HIGH (ref 70–99)
Phosphorus: 5.8 mg/dL — ABNORMAL HIGH (ref 2.5–4.6)
Potassium: 4.1 mmol/L (ref 3.5–5.1)
Sodium: 135 mmol/L (ref 135–145)

## 2019-05-27 LAB — CBC
HCT: 26.5 % — ABNORMAL LOW (ref 39.0–52.0)
Hemoglobin: 8.2 g/dL — ABNORMAL LOW (ref 13.0–17.0)
MCH: 32 pg (ref 26.0–34.0)
MCHC: 30.9 g/dL (ref 30.0–36.0)
MCV: 103.5 fL — ABNORMAL HIGH (ref 80.0–100.0)
Platelets: 97 10*3/uL — ABNORMAL LOW (ref 150–400)
RBC: 2.56 MIL/uL — ABNORMAL LOW (ref 4.22–5.81)
RDW: 15.5 % (ref 11.5–15.5)
WBC: 4.5 10*3/uL (ref 4.0–10.5)
nRBC: 0 % (ref 0.0–0.2)

## 2019-05-27 MED ORDER — CARVEDILOL 3.125 MG PO TABS: 3.1250 mg | ORAL_TABLET | Freq: Two times a day (BID) | ORAL | 3 refills | Status: DC

## 2019-05-27 MED ORDER — MIDODRINE HCL 10 MG PO TABS: 10.0000 mg | ORAL_TABLET | ORAL | 0 refills | Status: DC

## 2019-05-27 MED ORDER — AMIODARONE HCL 200 MG PO TABS: 200.0000 mg | ORAL_TABLET | Freq: Every day | ORAL | 2 refills | Status: DC

## 2019-05-27 MED ORDER — DOCUSATE SODIUM 100 MG PO CAPS
100.0000 mg | ORAL_CAPSULE | Freq: Three times a day (TID) | ORAL | Status: DC
  Administered 2019-05-27 – 2019-05-28 (×2): 100 mg via ORAL
  Filled 2019-05-27 (×2): qty 1

## 2019-05-27 MED ORDER — DOCUSATE SODIUM 100 MG PO CAPS: 100.0000 mg | ORAL_CAPSULE | Freq: Two times a day (BID) | ORAL | 0 refills | Status: AC

## 2019-05-27 MED ORDER — MELATONIN 3 MG PO TABS: 1.5000 mg | ORAL_TABLET | Freq: Every day | ORAL | 0 refills | Status: DC

## 2019-05-27 MED ORDER — ACETAMINOPHEN 325 MG PO TABS: 325.0000 mg | ORAL_TABLET | ORAL | Status: DC | PRN

## 2019-05-27 MED ORDER — CLOPIDOGREL BISULFATE 75 MG PO TABS: 75.0000 mg | ORAL_TABLET | Freq: Every day | ORAL | 2 refills | Status: DC

## 2019-05-27 MED ORDER — PANTOPRAZOLE SODIUM 40 MG PO TBEC: 40.0000 mg | DELAYED_RELEASE_TABLET | Freq: Every day | ORAL | 0 refills | Status: AC

## 2019-05-27 MED ORDER — SUCROFERRIC OXYHYDROXIDE 500 MG PO CHEW: 1500.0000 mg | CHEWABLE_TABLET | Freq: Three times a day (TID) | ORAL | 0 refills | Status: DC

## 2019-05-27 MED ORDER — VITAMIN B-12 1000 MCG PO TABS: 1000.0000 ug | ORAL_TABLET | Freq: Every evening | ORAL | 0 refills | Status: AC

## 2019-05-27 MED ORDER — DARBEPOETIN ALFA 100 MCG/0.5ML IJ SOSY: 100.0000 ug | PREFILLED_SYRINGE | INTRAMUSCULAR | Status: AC

## 2019-05-27 MED ORDER — LORAZEPAM 1 MG PO TABS: 1.0000 mg | ORAL_TABLET | Freq: Two times a day (BID) | ORAL | 0 refills | Status: DC | PRN

## 2019-05-27 MED ORDER — VANCOMYCIN HCL IN DEXTROSE 750-5 MG/150ML-% IV SOLN: 750.0000 mg | INTRAVENOUS | 0 refills | Status: DC

## 2019-05-27 MED ORDER — ATORVASTATIN CALCIUM 80 MG PO TABS: 80.0000 mg | ORAL_TABLET | Freq: Every day | ORAL | 0 refills | Status: DC

## 2019-05-27 MED ORDER — CALCITRIOL 0.5 MCG PO CAPS: 1.5000 ug | ORAL_CAPSULE | ORAL | 0 refills | Status: DC

## 2019-05-27 MED ORDER — CALCITRIOL 0.5 MCG PO CAPS
ORAL_CAPSULE | ORAL | Status: AC
  Filled 2019-05-27: qty 3

## 2019-05-27 MED ORDER — OXYCODONE HCL 5 MG PO TABS: 5.0000 mg | ORAL_TABLET | ORAL | 0 refills | Status: DC | PRN

## 2019-05-27 MED ORDER — MIDODRINE HCL 5 MG PO TABS
ORAL_TABLET | ORAL | Status: AC
  Filled 2019-05-27: qty 2

## 2019-05-27 NOTE — Progress Notes (Signed)
Patient c/o pain to right surgical stump area rating pain 7/10 medicated with prn medication

## 2019-05-27 NOTE — Patient Care Conference (Signed)
Inpatient RehabilitationTeam Conference and Plan of Care Update Date: 05/27/2019   Time: 11:50 AM    Patient Name: Jose Garrett      Medical Record Number: [id number]  Date of Birth: 1950-11-03 Sex: Male         Room/Bed: 4M08C/4M08C-01 Payor Info: Payor: [employer] MEDICARE / Plan: [facility] MEDICARE / Product Type: *No Product type* /    Admit Date/Time:  05/18/2019  4:18 PM  Primary Diagnosis:  Right below-knee amputee [facility])  Patient Active Problem List   Diagnosis Date Noted  . Sleep disturbance   . Nausea without vomiting   . Orthostatic hypotension   . Acute on chronic anemia   . Chronic combined systolic and diastolic congestive heart failure ([place])   . ESRD on dialysis ([place])   . Bacteremia   . Right below-knee amputee ([place]) 05/18/2019  . Pressure injury of skin 05/14/2019  . Enterococcal bacteremia 05/13/2019  . Polymorphic ventricular tachycardia ([place]) 10/24/2018  . S/P TAVR (transcatheter aortic valve replacement) 10/21/2018  . Anxiety   . Coronary artery disease involving native coronary artery of native heart without angina pectoris   . Thrombocytopenia ([place])   . PAD (peripheral artery disease) ([place]) 07/22/2018  . Acute on chronic combined systolic and diastolic CHF (congestive heart failure) ([place]) 11/30/2017  . ESRD (end stage renal disease) on dialysis ([place])   . Iron deficiency anemia 11/22/2015  . AL amyloidosis ([place]) 09/25/2014  . Anemia of chronic disease 09/25/2014  . Essential hypertension, benign 11/06/2013    Expected Discharge Date: Expected Discharge Date: 05/28/19  Team Members Present: Physician leading conference: Dr. Leeroy Cha Care Coodinator Present: Nestor Lewandowsky, RN, BSN, CRRN;Genie Sanuel Ladnier, RN, MSN Nurse Present: Toy Cookey, LPN PT Present: Becky Sax, PT OT Present: Simonne Come, OT SLP Present: Stormy Fabian, SLP PPS Coordinator present : Gunnar Fusi, Novella Olive, PT     Current Status/Progress Goal Weekly Team Focus   Bowel/Bladder   pt is anuric, continent of bowel and bladder, LBM: 05/24/19  Remain continent of bowel and bladder  Assess bowel q shift/ PRN   Swallow/Nutrition/ Hydration             ADL's   Supervision ambulatory transfers, Mod I lateral scoot transfers, Supervision approaching Mod I self-care tasks  Mod I overall, Supervision shower transfer  ADL retraining, functional transfers, dynamic standing balance, education on limb care, d/c planning   Mobility   bed mobility independent, lateral scoot transfers supervision, ambulation 75f CGA, WC mobility [id number]  Mod I transfers, supervision gait  functional mobility/transfers, UE/LE strength, dynamic standing balance, ambulation, stair navigation, endurance, amputee education   Communication             Safety/Cognition/ Behavioral Observations            Pain   pt complain of pain level 7/10 in surgical site  pain level <4/10  assess pain q shift and PRN   Skin   bruising in abdomen, stage 2 wound in left lower buttock is healing pink and dry, R BKA  prevent from skin breakdown and infection  assess skin q shift and PRN    Rehab Goals Patient on target to meet rehab goals: Yes *See Care Plan and progress notes for long and short-term goals.     Barriers to Discharge  Current Status/Progress Possible Resolutions Date Resolved   Nursing                  PT  Home environment  access/layout;Weight bearing restrictions  1 STE with 0 rails, WC not wide enough to fit in bathroom              OT                  SLP                SW Inaccessible home environment;Home environment access/layout;Decreased caregiver support;Wound Care One step entry to home, no ramp Patient reports his daughter is looking for other housing for them that is handicapped accessible          Discharge Planning/Teaching Needs:  Home alone with daughter and friend assisting as needed  Transfers, toileting, medications, etc. Family education  05/26/19 with daughter   Team Discussion: MD shrinker yesterday, increased pain, on oxy, worse at night, constipation, meds adjusted.  RN BM 3/14, stump drsg, stage 2 on buttocks is healed.  OT mod I b/D, mod I squat pivot, S stand pivot, dtr here for fam ed, needs S with bathroom, practiced wrapping stump.  PT met goals, amb S 50' RW, lateral scoot transfers independent.   Revisions to Treatment Plan: N/A     Medical Summary Current Status: Continues to have some residual limb pain, appears to be worse at night, had some pain with new shrinker yesterday, constipation Weekly Focus/Goal: Management of residual limb pain, counseling regarding most beneficial medications  Barriers to Discharge: Medical stability   Possible Resolutions to Barriers: Continued pain couseling, wound care, stage 2 pressure ulcer to buttocks has healed, continued intensive therapies, constipation regimen   Continued Need for Acute Rehabilitation Level of Care: The patient requires daily medical management by a physician with specialized training in physical medicine and rehabilitation for the following reasons: Direction of a multidisciplinary physical rehabilitation program to maximize functional independence : Yes Medical management of patient stability for increased activity during participation in an intensive rehabilitation regime.: Yes Analysis of laboratory values and/or radiology reports with any subsequent need for medication adjustment and/or medical intervention. : Yes   I attest that I was present, lead the team conference, and concur with the assessment and plan of the team.   [name] 05/27/2019, 3:28 PM   Team conference was held via web/ teleconference due to [place] - 19

## 2019-05-27 NOTE — Progress Notes (Signed)
[place] PHYSICAL MEDICINE & REHABILITATION PROGRESS NOTE  Subjective/Complaints: Feeling well this morning. Last BM day before yesterday; he is willing to add additional stool softener.  Denies pain currently. Had some residual pain last night and received Oxycodone which helped. Received shrinker yesterday. Had good session with PT today.   ROS:  Pt denies SOB, abd pain, CP, N/V/C/D, and vision changes  Objective: Vital Signs: Blood pressure (!) 158/59, pulse 62, temperature 98 F (36.7 C), temperature source Oral, resp. rate 18, height 5\' 10"  (1.778 m), weight 67.8 kg, SpO2 97 %. No results found. Recent Labs    05/25/19 1325  WBC 5.4  HGB 7.9*  HCT 25.7*  PLT 105*   Recent Labs    05/25/19 1330  NA 136  K 4.6  CL 98  CO2 23  GLUCOSE 130*  BUN 37*  CREATININE 8.63*  CALCIUM 9.3    Physical Exam: BP (!) 158/59 (BP Location: Left Arm)   Pulse 62   Temp 98 F (36.7 C) (Oral)   Resp 18   Ht 5\' 10"  (1.778 m)   Wt 67.8 kg   SpO2 97%   BMI 21.45 kg/m  Constitutional: No distress . Sitting in [place] comfortably.  HENT: Normocephalic.  Atraumatic. Eyes: conjugate gaze Cardiovascular: no JVD Respiratory: no resp distress; no accessory muscle use; breathing normal rate GI: soft, Nondistended Skin: Warm and dry.  Dressing over R BKA- canot assess Psych: appropriate; talkative Musc: Left lower extremity digits 2,3 amputations. Shrinker in place.  Neurologic:Ox3 Motor: Bilateral upper extremities: 4+/5 proximal distal Right lower extremity: Hip flexion 4/5, stable Left lower extremity: 4/5 proximal distal, stable  Assessment/Plan: 1. Functional deficits secondary to right BKA which require 3+ hours per day of interdisciplinary therapy in a comprehensive inpatient rehab setting.  Physiatrist is providing close team supervision and 24 hour management of active medical problems listed below.  Physiatrist and rehab team continue to assess barriers to  discharge/monitor patient progress toward functional and medical goals  Care Tool:  Bathing    Body parts bathed by patient: Right arm, Left arm, Chest, Abdomen, Front perineal area, Buttocks, Right upper leg, Left upper leg, Face     Body parts n/a: Right lower leg   Bathing assist Assist Level: Contact Guard/Touching assist     Upper Body Dressing/Undressing Upper body dressing   What is the patient wearing?: Button up shirt    Upper body assist Assist Level: Set up assist    Lower Body Dressing/Undressing Lower body dressing      What is the patient wearing?: Underwear/pull up, Pants     Lower body assist Assist for lower body dressing: Minimal Assistance - Patient > 75%     Toileting Toileting    Toileting assist Assist for toileting: Minimal Assistance - Patient > 75% Assistive Device Comment: bsc   Transfers Chair/bed transfer  Transfers assist     Chair/bed transfer assist level: Supervision/Verbal cueing     Locomotion Ambulation   Ambulation assist      Assist level: Contact Guard/Touching assist Assistive device: Walker-rolling Max distance: 62ft   Walk 10 feet activity   Assist     Assist level: Contact Guard/Touching assist Assistive device: Walker-rolling   Walk 50 feet activity   Assist Walk 50 feet with 2 turns activity did not occur: Safety/medical concerns         Walk 150 feet activity   Assist Walk 150 feet activity did not occur: Safety/medical concerns(fatigue, balance/postural control, R BKA)  Walk 10 feet on uneven surface  activity   Assist Walk 10 feet on uneven surfaces activity did not occur: Safety/medical concerns(fatigue, balance/postural control, R BKA)         Wheelchair     Assist Will patient use wheelchair at discharge?: Yes Type of Wheelchair: Manual    Wheelchair assist level: Supervision/Verbal cueing Max wheelchair distance: >200'    Wheelchair 50 feet with 2 turns  activity    Assist        Assist Level: Supervision/Verbal cueing   Wheelchair 150 feet activity     Assist     Assist Level: Supervision/Verbal cueing      Medical Problem List and Plan: 1.Decreased functional mobilitysecondary to right BKA 2/25/2021after failed transmetatarsal amputation and recent right lower extremity bypass  Continue CIR 2. Antithrombotics: -DVT/anticoagulation:Subcutaneous heparin -antiplatelet therapy: Plavix 75 mg daily 3. Pain Management:Robaxin oxycodone as needed  Controlled with medications on 3/12  3/13- back pain is an issue- usually takes oxycodone 5-10 mg- but tries not to take regularly- recliner and back pain a problems during HD  3/15-3/16: stable  3/17: Has increased pain in residual limb at night. Has been well controlled with Oxycodone though he feels it takes some time to act. Advised Gabapentin 100mg  to help with phantom limb pain and insomnia. Patient resistant to trying at this time as was on this medication for a long time in the past. Advised that it is a safe medication with less addictive potential than Oxycodone. Given ESRD, only low dose would be permitted.   Monitor with increased exertion 4. Mood:Ativan as needed -antipsychotic agents: N/A 5. Neuropsych: This patientiscapable of making decisions on hisown behalf. 6. Skin/Wound Care:Routine skin checks 7. Fluids/Electrolytes/Nutrition:Routine in and outs. 8. ID/enterococcal bacteremia. Follow-up infectious disease. Continue IV vancomycin x4 weekswith HD to be completed 06/10/2019  Level within normal limits on 3/12 9. ESRD:Continue hemodialysis as directed  3/13- M/W/F 10. CAD/TAVRAugust 2020. Continue Plavix as well as amiodarone 200 mg daily as directed. 11. Chronic combined systolic and diastolic congestive heart failure. Monitor for any signs of fluid overload.Continue Coreg 3.125 mg twice daily Filed Weights   05/25/19  1716 05/26/19 0447 05/27/19 0446  Weight: 70.9 kg 71 kg 67.8 kg   3/14- renal added fluid restriction due to weight gain last 2 days- from 69.3 to 71.8 in last 2 days.   3/15: weight increase to 73.3kg. Continue to monitor. Has HD today.   3/16: stable 12. Acute on chronic anemia. Continue Aranesp.   Hemoglobin 8.3 on 3/11  Labs with HD today  3/12- Hb down to 7.3- asymptomatic- per renal if needs transfusion.  13.Orthostasis. ProAmatine 10 mg Monday Wednesday Friday with hemodialysis.   Blood pressure labile with HD. Continues to be labile.   Monitor with increased mobility 14. Hyperlipidemia. Lipitor 15.  Thrombocytopenia  Platelets 125 on 2/11, labs with HD today  Continue to monitor 16.  Sleep disturbance  Melatonin started on 3/11, encouraged trial  3/13- slept well   3/17: sleeping poorly at night. Says this is chronic for him. Discussed Gabapentin as above. Defers at this time. Can reassess when we see him as an outpatient.  17.  Intermittent transient nausea-likely multifactorial: Pain meds +/- IV antibiotics +/- bacteremia +/- HD  Improving 18. Constipation  3/13- asking for stool sotener- declined laxative- will see how going and add Prn meds if needed  3/14- had good BM- denies constipation now  3/15: Had hard stool with straining last night. Increased Colace  to BID.   3/16: Last BM day before yesterday; he is willing to add additional stool softener. Will increase Colace to TID 19. Nausea  3/14- added compazine to med list as needed   LOS: 9 days A FACE TO FACE EVALUATION WAS [place] Day[place] 05/27/2019, 10:23 AM

## 2019-05-27 NOTE — Progress Notes (Signed)
Occupational Therapy Session Note  Patient Details  Name: Jose Garrett MRN: [id number] Date of Birth: 1950-06-16  Today's Date: 05/27/2019 OT Individual Time: 3845-3646 and 1033-1130 OT Individual Time Calculation (min): 60 min and 57 min   Short Term Goals: Week 1:  OT Short Term Goal 1 (Week 1): Pt will complete toilet transfers with supervision OT Short Term Goal 2 (Week 1): Pt will complete LB dressing at sit > stand level with supervision OT Short Term Goal 3 (Week 1): Pt will complete toileting tasks with supervision  Skilled Therapeutic Interventions/Progress Updates:    1) Completed ADL retraining at overall Mod I level with bathing/dressing tasks with lateral leans.  Pt completed lateral scoot transfer bed <> w/c Mod I and completed transfer to shower seat with Supervision.  Bathing completed overall Mod I with lateral leans on tub bench, pt plans to complete bathing in same fashion at home.  LB dressing completed with pt donning underwear and pants in w/c and then lateral scoot to bed to fully adjust pants over hips.  Pt reports understanding of recommendation to complete sit > stand with Supervision, pt stating that he does not feel sturdy enough to complete LB dressing in standing even with supervision at this time.  Pt remained seated EOB with coffee and all needs in reach.  2) Treatment session with focus on care for residual limb.  Engaged in education of limb inspection and limb wrapping as well as desensitization strategies.  Provided pt with handout for massage and tapping and handout for limb wrapping.  Therapist wrapped residual limb in figure 8 pattern x2 to demonstrate and verbalize technique.  Pt completed additional 2x with min-mod question cues for proper technique and to ensure complete coverage.  Pt utilized inspection mirror during session to inspect wrapping technique.  Reiterated desensitization and phantom pain/sensation techniques.  Provided pt with pamphlet for  Amputee Support Group.  Pt pleased with information and progress and reports ready for d/c home tomorrow.  Therapy Documentation Precautions:  Precautions Precautions: Fall Precaution Comments: NWB RLE Required Braces or Orthoses: Knee Immobilizer - Right Knee Immobilizer - Right: On at all times Restrictions Weight Bearing Restrictions: Yes RLE Weight Bearing: Non weight bearing Pain: Pain Assessment Pain Scale: 0-10 Pain Score: 0-No pain   Therapy/Group: Individual Therapy  Simonne Come 05/27/2019, 11:40 AM

## 2019-05-27 NOTE — Progress Notes (Signed)
[facility] Progress Note   Subjective:   No c/o today  For DC tomorrow  Objective Vitals:   05/26/19 0845 05/26/19 1314 05/26/19 1934 05/27/19 0446  BP: (!) 117/51 (!) 147/53 (!) 150/63 (!) 158/59  Pulse: 72 66 68 62  Resp:  18 18 18   Temp:  97.8 F (36.6 C) 98.3 F (36.8 C) 98 F (36.7 C)  TempSrc:   Oral Oral  SpO2:  100% 100% 97%  Weight:    67.8 kg  Height:       Physical Exam General: Pleasant older male in NAD Heart: S1,S2 RRR Lungs: Ctab Abdomen:S, NT, ND Extremities: R BKA ace wrap no LLE edema Dialysis Access: R AVF + bruit   Additional Objective Labs: Basic Metabolic Panel: Recent Labs  Lab 05/21/19 0608 05/22/19 1244 05/25/19 1330  NA 133* 133* 136  K 3.9 4.4 4.6  CL 96* 94* 98  CO2 28 24 23   GLUCOSE 104* 109* 130*  BUN 14 29* 37*  CREATININE 4.00* 6.56* 8.63*  CALCIUM 8.4* 8.8* 9.3  PHOS 3.8 5.4* 6.4*   Liver Function Tests: Recent Labs  Lab 05/21/19 0608 05/22/19 1244 05/25/19 1330  ALBUMIN 2.5* 2.5* 2.7*   No results for input(s): LIPASE, AMYLASE in the last 168 hours. CBC: Recent Labs  Lab 05/20/19 1445 05/20/19 1445 05/21/19 0608 05/22/19 1249 05/25/19 1325  WBC 5.8   < > 5.9 5.2 5.4  HGB 7.7*   < > 8.3* 7.3* 7.9*  HCT 24.9*   < > 26.0* 23.6* 25.7*  MCV 103.3*  --  101.6* 103.5* 105.8*  PLT 119*   < > 125* 108* 105*   < > = values in this interval not displayed.   Blood Culture    Component Value Date/Time   SDES BLOOD LEFT ANTECUBITAL 05/14/2019 0522   SPECREQUEST AEROBIC BOTTLE ONLY Blood Culture adequate volume 05/14/2019 0522   CULT  05/14/2019 0522    NO GROWTH 5 DAYS Performed at [facility], [place] [address], [place], [place] [address]    REPTSTATUS 05/19/2019 FINAL 05/14/2019 0522    Cardiac Enzymes: No results for input(s): CKTOTAL, CKMB, CKMBINDEX, TROPONINI in the last 168 hours. CBG: No results for input(s): GLUCAP in the last 168 hours. Iron Studies: No results for input(s):  IRON, TIBC, TRANSFERRIN, FERRITIN in the last 72 hours. @lablastinr3 @ Studies/Results: No results found. Medications: . vancomycin 750 mg (05/25/19 1603)   . amiodarone  200 mg Oral Daily  . atorvastatin  80 mg Oral Q supper  . calcitRIOL  1.5 mcg Oral Q M,W,F-HD  . carvedilol  3.125 mg Oral BID  . Chlorhexidine Gluconate Cloth  6 each Topical Q0600  . clopidogrel  75 mg Oral Q supper  . darbepoetin (ARANESP) injection - DIALYSIS  100 mcg Intravenous Q Fri-HD  . docusate sodium  100 mg Oral TID  . heparin  5,000 Units Subcutaneous Q8H  . Melatonin  1.5 mg Oral QHS  . midodrine  10 mg Oral Q M,W,F-HD  . multivitamin  1 tablet Oral QHS  . pantoprazole  40 mg Oral Daily  . sucroferric oxyhydroxide  1,500 mg Oral TID WC  . vitamin B-12  1,000 mcg Oral QPM     Dialysis Orders: MWF [facility] 4h 400/800 76.5kg 2/2 bath P4 RUE AVF  -Heparin  2400 units IV TIW - Parsabiv 5mg  IV q HD - Calcitriol 1.42mcg PO q HD - Mircera 37mcg IV q 2 weeks (last given 64mcg on 1/20, Hgb  10.3 on 2/10)  Assessment/Plan: 1. PAD w/ gangrenous changes -s/p R TMA on 2/1 healing poorly so s/p R BKA on 2/25. Now on CIR. 2. Sepsis: +BC w/enterococcus, onvanomycin&improving.Plan for vanc for 4 weeks after last negative culturewhich was 05/14/19.Etiology unclear, incision sites clean per VVS, CXR w/o active dz, no urine available to culture. ID following. TEE shows normal appearing AV prothesis, no vegetation.Clinically improved, remains on vancomycin. 3. ESRD- onMWF HD.K+ controlled. Next HD 05/27/19 use2K bath.EDW 71kg? No more than 1.5L UF.  AVF 400/80 4. Anemiaof CKD-HGB down to 7.9;Aranesp 100 mcg IV q Friday. tsat 35%, no Fe needed. Follow HGB,Transfuse prn. 5. Secondary hyperparathyroidism- Calcium controlled, phosnow at goalw/increased Velphoro dose (3/2). Continue binders and VDRA.  6. HTN/volume-Blood pressure controlled. Euvolemic on exam 7. Nutrition- Alb 2.5, Renal  diet w/fluid restrictions, protein supplements, Vit 8. Hx TAVR 9. CAD/HFrEF: euvolemic  Rexene Agent  05/27/2019, 11:17 AM  [facility]

## 2019-05-27 NOTE — Progress Notes (Signed)
Occupational Therapy Discharge Summary  Patient Details  Name: Jose Garrett MRN: [id number] Date of Birth: 05-08-1950   Patient has met 8 of 10 long term goals due to improved activity tolerance, improved balance, ability to compensate for deficits and improved awareness.  Patient to discharge at overall Supervision level with ambulation and dynamic standing balance, Mod I w/c or seated level for bathing/dressing tasks.  Patient's care partner is independent to provide the necessary physical assistance at discharge.  Patient's daughter present for family education and demonstrated understanding of recommendation for w/c level and Supervision when needing to ambulate or stand.  Pt and daughter in agreement  Reasons goals not met: Pt requires supervision for dynamic standing and sit > stand.  Recommendation:  Patient will benefit from ongoing skilled OT services in home health setting to continue to advance functional skills in the area of BADL and Reduce care partner burden.  Equipment: drop arm BSC  Reasons for discharge: treatment goals met and discharge from hospital  Patient/family agrees with progress made and goals achieved: Yes  OT Discharge Precautions/Restrictions  Restrictions Weight Bearing Restrictions: Yes RLE Weight Bearing: Non weight bearing Pain Pain Assessment Pain Scale: 0-10 Pain Score: 0-No pain ADL ADL Eating: Independent Where Assessed-Eating: Bed level Grooming: Setup Where Assessed-Grooming: Sitting at sink Upper Body Dressing: Setup Where Assessed-Upper Body Dressing: Bed level Lower Body Dressing: Minimal assistance Where Assessed-Lower Body Dressing: Edge of bed Toilet Transfer: Minimal assistance Toilet Transfer Method: Squat pivot Toilet Transfer Equipment: Drop arm bedside commode Vision Baseline Vision/History: Wears glasses Wears Glasses: At all times Patient Visual Report: No change from baseline Vision Assessment?: No apparent visual  deficits Cognition Overall Cognitive Status: Within Functional Limits for tasks assessed Arousal/Alertness: Awake/alert Orientation Level: Oriented X4 Memory: Appears intact Awareness: Appears intact Problem Solving: Appears intact Safety/Judgment: Appears intact Sensation Sensation Light Touch: Impaired by gross assessment Proprioception: Impaired by gross assessment Additional Comments: decreased below L knee due to peripheral neuropathy; decreased around incision site on RLE Coordination Gross Motor Movements are Fluid and Coordinated: No Fine Motor Movements are Fluid and Coordinated: Yes Coordination and Movement Description: grossly uncoordinated due to R BKA and peripheral neuropathy on LLE Finger Nose Finger Test: [facility] Heel Shin Test: WFL (except for R BKA) Motor  Motor Motor: Abnormal postural alignment and control Motor - Skilled Clinical Observations: grossly uncoordinated due to R BKA and peripheral neuropathy on LLE, able to compensate well with RW  Trunk/Postural Assessment  Cervical Assessment Cervical Assessment: Within Functional Limits Thoracic Assessment Thoracic Assessment: Within Functional Limits Lumbar Assessment Lumbar Assessment: Exceptions to [facility]  Balance Balance Balance Assessed: Yes Static Sitting Balance Static Sitting - Balance Support: No upper extremity supported Static Sitting - Level of Assistance: 7: Independent Dynamic Sitting Balance Dynamic Sitting - Balance Support: No upper extremity supported Dynamic Sitting - Level of Assistance: 7: Independent Static Standing Balance Static Standing - Balance Support: Bilateral upper extremity supported(RW) Static Standing - Level of Assistance: 5: Stand by assistance(supervision) Dynamic Standing Balance Dynamic Standing - Balance Support: Bilateral upper extremity supported(RW) Dynamic Standing - Level of Assistance: 5: Stand by assistance(supervision) Extremity/Trunk Assessment RUE  Assessment RUE Assessment: Within Functional Limits General Strength Comments: grossly 4/5 overall LUE Assessment LUE Assessment: Within Functional Limits General Strength Comments: grossly 4/5   Tarez Bowns 05/27/2019, 11:44 AM

## 2019-05-27 NOTE — Progress Notes (Signed)
Patient continue c/o pain in surigical stump, requested to check surgical site d/t blood leaking through the shrinker. Nurse assessed the site observed no dressing was applied to the surgical wound. Nurse cleansed site with skin-prep, applied ABD pad, wrapped it with kerlix and ACE. Patient stated "I felt relieved" after dressing change. Karlene Einstein Lawson Mahone, LPN

## 2019-05-27 NOTE — Progress Notes (Signed)
Physical Therapy Discharge Summary  Patient Details  Name: Jose Garrett MRN: [id number] Date of Birth: 08/14/1950  Today's Date: 05/27/2019 PT Individual Time: 0915-1010 PT Individual Time Calculation (min): 55 min    Patient has met 10 of 11 long term goals due to improved activity tolerance, improved balance, improved postural control, increased strength, decreased pain and improved coordination. Patient to discharge at a wheelchair level Supervision. Patient's care partner is independent to provide the necessary physical assistance at discharge. Pt's daughter attended family education training on 3/16 and verbalized/demonstrated confidence with all tasks to ensure safe discharge home.   Reasons goals not met: Pt unable to navigate 1 curb with RW due to decreased UE strength and L LE strength requiring total assist to bump up/down curb in WC. However, pt's daughter verbalized/demonstrated confidence with this task during family education training. Pt reported feeling confident with how to verbalize technique to daughter as well.   Recommendation:  Patient will benefit from ongoing skilled PT services in home health setting to continue to advance safe functional mobility, address ongoing impairments in transfers, LE/UE strength, dynamic standing balance/coordination, amputee education, endurance, and to minimize fall risk.  Equipment: R amputee pad  Reasons for discharge: treatment goals met  Patient/family agrees with progress made and goals achieved: Yes  Today's Interventions: Received pt supine in bed, pt agreeable to therapy, and denied any pain during session, but stated that his R residual limb felt "tender". Session focused on discharge planning, functional mobility/transfers, simulated car transfer, ambulation, UE/LE strength, dynamic standing balance, amputee education, and improved endurance with activity. Pt performed bed mobility independently. Pt transferred bed<>WC via  lateral scoot mod I. Pt performed WC mobility 11f using bilateral UEs independently. Pt able to set up car transfer independently and perform WC parts management independently. Pt performed simulated car transfer via lateral scoot with supervision. Pt required verbal cues for scooting prior to transferring out of the car. Pt ambulated 580fwith RW and supervision. Pt with decreased LLE clearance and increased use of bilateral UE on RW. Pt fatigued after ambulation and required extended rest break. Pt performed the following exercises seated in WC: -Bicep curls with 3lb dumbbells 2x8 -LAQ 2x8 (2lb ankle weight on LLE, body weight on RLE) -Hip flexion 2x8 (2lb ankle weight on LLE, body weight on RLE) [facility]. Concluded session with pt sitting in WC, needs within reach, and chair pad alarm on.   PT Discharge Precautions/Restrictions Precautions Precautions: Fall Precaution Comments: NWB RLE Restrictions Weight Bearing Restrictions: Yes RLE Weight Bearing: Non weight bearing Cognition Overall Cognitive Status: Within Functional Limits for tasks assessed Arousal/Alertness: Awake/alert Orientation Level: Oriented X4 Memory: Appears intact Awareness: Appears intact Problem Solving: Appears intact Safety/Judgment: Appears intact Sensation Sensation Light Touch: Impaired by gross assessment Proprioception: Impaired by gross assessment Additional Comments: decreased below L knee due to peripheral neuropathy; decreased around incision site on RLE Coordination Gross Motor Movements are Fluid and Coordinated: No Fine Motor Movements are Fluid and Coordinated: Yes Coordination and Movement Description: grossly uncoordinated due to R BKA and peripheral neuropathy on LLE Finger Nose Finger Test: [facility]el Shin Test: WFL (except for R BKA) Motor  Motor Motor: Abnormal postural alignment and control Motor - Skilled Clinical Observations: grossly uncoordinated  due to R BKA and peripheral neuropathy on LLE  Mobility Bed Mobility Bed Mobility: Rolling Right;Rolling Left;Supine to Sit Rolling Right: Independent Rolling Left: Independent Supine to Sit: Independent Transfers Transfers: Sit to Stand;Lateral/Scoot  Transfers Sit to Stand: Supervision/Verbal cueing(RW) Lateral/Scoot Transfers: Independent with assistive device Transfer (Assistive device): None Locomotion  Gait Ambulation: Yes Gait Assistance: Supervision/Verbal cueing(RW) Gait Distance (Feet): 50 Feet Assistive device: Rolling walker Gait Assistance Details: Verbal cues for technique;Verbal cues for precautions/safety Gait Assistance Details: verbal cues for energy conservation strategies Gait Gait: Yes Gait Pattern: Impaired Gait Pattern: Decreased trunk rotation;Decreased stride length;Decreased step length - left;Poor foot clearance - left Gait velocity: decreased Wheelchair Mobility Wheelchair Mobility: Yes Wheelchair Assistance: Independent with Camera operator: Both upper extremities Wheelchair Parts Management: Independent Distance: 187f  Trunk/Postural Assessment  Cervical Assessment Cervical Assessment: Within Functional Limits Thoracic Assessment Thoracic Assessment: Within Functional Limits Lumbar Assessment Lumbar Assessment: Exceptions to WFL(posterior pelvic tilt) Postural Control Postural Control: Deficits on evaluation  Balance Balance Balance Assessed: Yes Static Sitting Balance Static Sitting - Balance Support: No upper extremity supported Static Sitting - Level of Assistance: 7: Independent Dynamic Sitting Balance Dynamic Sitting - Balance Support: No upper extremity supported Dynamic Sitting - Level of Assistance: 7: Independent Static Standing Balance Static Standing - Balance Support: Bilateral upper extremity supported(RW) Static Standing - Level of Assistance: 5: Stand by assistance(supervision) Dynamic Standing  Balance Dynamic Standing - Balance Support: Bilateral upper extremity supported(RW) Dynamic Standing - Level of Assistance: 5: Stand by assistance(supervision) Extremity Assessment  RLE Assessment RLE Assessment: Exceptions to [facility]General Strength Comments: grossly generalized to 4/5 (hip flexion, add/abd) LLE Assessment LLE Assessment: Exceptions to [facility]Col[facility] LPGeneral Strength Comments: grossly generalized to 4/5 (except knee flexion 4-/5)  AAlfonse AlpersPT, DPT  05/27/2019, 7:37 AM

## 2019-05-27 NOTE — Plan of Care (Signed)
  Problem: Consults Goal: RH LIMB LOSS PATIENT EDUCATION Description: Description: See Patient Education module for eduction specifics. Outcome: Progressing Goal: Skin Care Protocol Initiated - if Braden Score 18 or less Description: If consults are not indicated, leave blank or document N/A Outcome: Progressing   Problem: RH BOWEL ELIMINATION Goal: RH STG MANAGE BOWEL WITH ASSISTANCE Description: STG Manage Bowel with min Assistance. Outcome: Progressing Goal: RH STG MANAGE BOWEL W/MEDICATION W/ASSISTANCE Description: STG Manage Bowel with Medication with min Assistance. Outcome: Progressing   Problem: RH SKIN INTEGRITY Goal: RH STG SKIN FREE OF INFECTION/BREAKDOWN Description: Skin to remain free from breakdown while on rehab with min assist. Outcome: Progressing Goal: RH STG MAINTAIN SKIN INTEGRITY WITH ASSISTANCE Description: STG Maintain Skin Integrity With min Assistance. Outcome: Progressing Goal: RH STG ABLE TO PERFORM INCISION/WOUND CARE W/ASSISTANCE Description: STG Able To Perform Incision/Wound Care With min Assistance. Outcome: Progressing   Problem: RH SAFETY Goal: RH STG ADHERE TO SAFETY PRECAUTIONS W/ASSISTANCE/DEVICE Description: STG Adhere to Safety Precautions With min Assistance and appropriate assistive Device. Outcome: Progressing Goal: RH STG DECREASED RISK OF FALL WITH ASSISTANCE Description: STG Decreased Risk of Fall With min Assistance. Outcome: Progressing   Problem: RH PAIN MANAGEMENT Goal: RH STG PAIN MANAGED AT OR BELOW PT'S PAIN GOAL Description: <4 on a 0-10 pain scale Outcome: Progressing   Problem: RH KNOWLEDGE DEFICIT LIMB LOSS Goal: RH STG INCREASE KNOWLEDGE OF SELF CARE AFTER LIMB LOSS Description: Patient and caregiver will demonstrate knowledge of incision care, stump care, medication management, and follow up care with the MD post discharge with min assist from staff. Outcome: Progressing

## 2019-05-28 NOTE — Plan of Care (Signed)
  Problem: Consults Goal: RH LIMB LOSS PATIENT EDUCATION Description: Description: See Patient Education module for eduction specifics. 05/28/2019 1150 by Toy Cookey F, LPN Outcome: Completed/Met 05/28/2019 1150 by Amanda Cockayne, LPN Outcome: Progressing Goal: Skin Care Protocol Initiated - if Braden Score 18 or less Description: If consults are not indicated, leave blank or document N/A 05/28/2019 1150 by Toy Cookey F, LPN Outcome: Completed/Met 05/28/2019 1150 by Amanda Cockayne, LPN Outcome: Progressing   Problem: RH BOWEL ELIMINATION Goal: RH STG MANAGE BOWEL WITH ASSISTANCE Description: STG Manage Bowel with min Assistance. 05/28/2019 1150 by Toy Cookey F, LPN Outcome: Completed/Met 05/28/2019 1150 by Amanda Cockayne, LPN Outcome: Progressing Goal: RH STG MANAGE BOWEL W/MEDICATION W/ASSISTANCE Description: STG Manage Bowel with Medication with min Assistance. 05/28/2019 1150 by Toy Cookey F, LPN Outcome: Completed/Met 05/28/2019 1150 by Amanda Cockayne, LPN Outcome: Progressing   Problem: RH SKIN INTEGRITY Goal: RH STG SKIN FREE OF INFECTION/BREAKDOWN Description: Skin to remain free from breakdown while on rehab with min assist. 05/28/2019 1150 by Toy Cookey F, LPN Outcome: Completed/Met 05/28/2019 1150 by Amanda Cockayne, LPN Outcome: Progressing Goal: RH STG MAINTAIN SKIN INTEGRITY WITH ASSISTANCE Description: STG Maintain Skin Integrity With min Assistance. 05/28/2019 1150 by Toy Cookey F, LPN Outcome: Completed/Met 05/28/2019 1150 by Amanda Cockayne, LPN Outcome: Progressing Goal: RH STG ABLE TO PERFORM INCISION/WOUND CARE W/ASSISTANCE Description: STG Able To Perform Incision/Wound Care With min Assistance. 05/28/2019 1150 by Toy Cookey F, LPN Outcome: Completed/Met 05/28/2019 1150 by Amanda Cockayne, LPN Outcome: Progressing   Problem: RH SAFETY Goal: RH STG ADHERE TO SAFETY PRECAUTIONS W/ASSISTANCE/DEVICE Description:  STG Adhere to Safety Precautions With min Assistance and appropriate assistive Device. 05/28/2019 1150 by Toy Cookey F, LPN Outcome: Completed/Met 05/28/2019 1150 by Amanda Cockayne, LPN Outcome: Progressing Goal: RH STG DECREASED RISK OF FALL WITH ASSISTANCE Description: STG Decreased Risk of Fall With min Assistance. 05/28/2019 1150 by Toy Cookey F, LPN Outcome: Completed/Met 05/28/2019 1150 by Toy Cookey F, LPN Outcome: Progressing   Problem: RH PAIN MANAGEMENT Goal: RH STG PAIN MANAGED AT OR BELOW PT'S PAIN GOAL Description: <4 on a 0-10 pain scale 05/28/2019 1150 by Toy Cookey F, LPN Outcome: Completed/Met 05/28/2019 1150 by Amanda Cockayne, LPN Outcome: Progressing   Problem: RH KNOWLEDGE DEFICIT LIMB LOSS Goal: RH STG INCREASE KNOWLEDGE OF SELF CARE AFTER LIMB LOSS Description: Patient and caregiver will demonstrate knowledge of incision care, stump care, medication management, and follow up care with the MD post discharge with min assist from staff. 05/28/2019 1150 by Toy Cookey F, LPN Outcome: Completed/Met 05/28/2019 1150 by Amanda Cockayne, LPN Outcome: Progressing

## 2019-05-28 NOTE — Progress Notes (Signed)
Patient refused CHG bath wipes and Heparin injection this morning. Patient stated "not needed I'm going home today." Reinforce education about the medication, but patient decided not to take it.

## 2019-05-28 NOTE — Plan of Care (Signed)
  Problem: Consults Goal: RH LIMB LOSS PATIENT EDUCATION Description: Description: See Patient Education module for eduction specifics. Outcome: Progressing Goal: Skin Care Protocol Initiated - if Braden Score 18 or less Description: If consults are not indicated, leave blank or document N/A Outcome: Progressing   Problem: RH BOWEL ELIMINATION Goal: RH STG MANAGE BOWEL WITH ASSISTANCE Description: STG Manage Bowel with min Assistance. Outcome: Progressing Goal: RH STG MANAGE BOWEL W/MEDICATION W/ASSISTANCE Description: STG Manage Bowel with Medication with min Assistance. Outcome: Progressing   Problem: RH SKIN INTEGRITY Goal: RH STG SKIN FREE OF INFECTION/BREAKDOWN Description: Skin to remain free from breakdown while on rehab with min assist. Outcome: Progressing Goal: RH STG MAINTAIN SKIN INTEGRITY WITH ASSISTANCE Description: STG Maintain Skin Integrity With min Assistance. Outcome: Progressing Goal: RH STG ABLE TO PERFORM INCISION/WOUND CARE W/ASSISTANCE Description: STG Able To Perform Incision/Wound Care With min Assistance. Outcome: Progressing   Problem: RH SAFETY Goal: RH STG ADHERE TO SAFETY PRECAUTIONS W/ASSISTANCE/DEVICE Description: STG Adhere to Safety Precautions With min Assistance and appropriate assistive Device. Outcome: Progressing Goal: RH STG DECREASED RISK OF FALL WITH ASSISTANCE Description: STG Decreased Risk of Fall With min Assistance. Outcome: Progressing   Problem: RH PAIN MANAGEMENT Goal: RH STG PAIN MANAGED AT OR BELOW PT'S PAIN GOAL Description: <4 on a 0-10 pain scale Outcome: Progressing   Problem: RH KNOWLEDGE DEFICIT LIMB LOSS Goal: RH STG INCREASE KNOWLEDGE OF SELF CARE AFTER LIMB LOSS Description: Patient and caregiver will demonstrate knowledge of incision care, stump care, medication management, and follow up care with the MD post discharge with min assist from staff. Outcome: Progressing

## 2019-05-28 NOTE — Progress Notes (Signed)
[facility] Progress Note   Subjective:   DC today  No c/o, doing well  Ready for outpt HD [facility] tomorrow  1.5L UF yesterday  Objective Vitals:   05/27/19 1630 05/27/19 1634 05/27/19 2001 05/28/19 0430  BP: 133/61 117/63 (!) 127/51 138/64  Pulse: 67 77 68 66  Resp:  20 18 16   Temp:  98.4 F (36.9 C) 98.8 F (37.1 C) 98 F (36.7 C)  TempSrc:  Oral Oral Oral  SpO2:  97% 96% 91%  Weight:  69.2 kg  69.8 kg  Height:       Physical Exam General: Pleasant older male in NAD Heart: S1,S2 RRR Lungs: Ctab Abdomen:S, NT, ND Extremities: R BKA ace wrap no LLE edema Dialysis Access: R AVF + bruit   Additional Objective Labs: Basic Metabolic Panel: Recent Labs  Lab 05/22/19 1244 05/25/19 1330 05/27/19 1457  NA 133* 136 135  K 4.4 4.6 4.1  CL 94* 98 95*  CO2 24 23 25   GLUCOSE 109* 130* 116*  BUN 29* 37* 27*  CREATININE 6.56* 8.63* 7.39*  CALCIUM 8.8* 9.3 9.1  PHOS 5.4* 6.4* 5.8*   Liver Function Tests: Recent Labs  Lab 05/22/19 1244 05/25/19 1330 05/27/19 1457  ALBUMIN 2.5* 2.7* 2.7*   No results for input(s): LIPASE, AMYLASE in the last 168 hours. CBC: Recent Labs  Lab 05/22/19 1249 05/25/19 1325 05/27/19 1457  WBC 5.2 5.4 4.5  HGB 7.3* 7.9* 8.2*  HCT 23.6* 25.7* 26.5*  MCV 103.5* 105.8* 103.5*  PLT 108* 105* 97*   Blood Culture    Component Value Date/Time   SDES BLOOD LEFT ANTECUBITAL 05/14/2019 0522   SPECREQUEST AEROBIC BOTTLE ONLY Blood Culture adequate volume 05/14/2019 0522   CULT  05/14/2019 0522    NO GROWTH 5 DAYS Performed at [facility], [place] [address], [place], [place] [address]    REPTSTATUS 05/19/2019 FINAL 05/14/2019 0522    Cardiac Enzymes: No results for input(s): CKTOTAL, CKMB, CKMBINDEX, TROPONINI in the last 168 hours. CBG: No results for input(s): GLUCAP in the last 168 hours. Iron Studies: No results for input(s): IRON, TIBC, TRANSFERRIN, FERRITIN in the last 72  hours. @lablastinr3 @ Studies/Results: No results found. Medications: . vancomycin 750 mg (05/27/19 1526)   . amiodarone  200 mg Oral Daily  . atorvastatin  80 mg Oral Q supper  . calcitRIOL  1.5 mcg Oral Q M,W,F-HD  . carvedilol  3.125 mg Oral BID  . Chlorhexidine Gluconate Cloth  6 each Topical Q0600  . clopidogrel  75 mg Oral Q supper  . darbepoetin (ARANESP) injection - DIALYSIS  100 mcg Intravenous Q Fri-HD  . docusate sodium  100 mg Oral TID  . heparin  5,000 Units Subcutaneous Q8H  . Melatonin  1.5 mg Oral QHS  . midodrine  10 mg Oral Q M,W,F-HD  . multivitamin  1 tablet Oral QHS  . pantoprazole  40 mg Oral Daily  . sucroferric oxyhydroxide  1,500 mg Oral TID WC  . vitamin B-12  1,000 mcg Oral QPM     Dialysis Orders: MWF [facility] 4h 400/800 76.5kg 2/2 bath P4 RUE AVF  -Heparin  2400 units IV TIW - Parsabiv 5mg  IV q HD - Calcitriol 1.31mcg PO q HD - Mircera 54mcg IV q 2 weeks (last given 17mcg on 1/20, Hgb 10.3 on 2/10)  Assessment/Plan: 1. PAD w/ gangrenous changes -s/p R TMA on 2/1 healing poorly so s/p R BKA on 2/25.  2. Sepsis: +BC w/enterococcus, onvanomycin&improving.Plan for  vanc for 4 weeks after last negative culturewhich was 05/14/19.Etiology unclear, incision sites clean per VVS, CXR w/o active dz, no urine available to culture. ID following. TEE shows normal appearing AV prothesis, no vegetation.Clinically improved, remains on vancomycin. 3. ESRD- onMWF HD.K+ controlled. EDW 69kg?  4. Anemiaof CKD-HGB down to 8.2Aranesp 100 mcg IV q Friday. tsat 35%, no Fe needed. Follow HGB,Transfuse prn. 5. Secondary hyperparathyroidism- Calcium controlled, phosnow at goalw/increased Velphoro dose (3/2). Continue binders and VDRA.  6. HTN/volume-Blood pressure controlled. Euvolemic on exam 7. Nutrition- Alb 2.5, Renal diet w/fluid restrictions, protein supplements, Vit 8. Hx TAVR 9. CAD/HFrEF: euvolemic  Rexene Agent  05/28/2019,  11:10 AM  [facility]

## 2019-05-28 NOTE — Progress Notes (Signed)
Patient was in HD and unable to review team conference.

## 2019-05-28 NOTE — Discharge Instructions (Signed)
Inpatient Rehab Discharge Instructions  [facility] Discharge date and time: No discharge date for patient encounter.   Activities/Precautions/ Functional Status: Activity: activity as tolerated Diet: renal diet Wound Care: keep wound clean and dry Functional status:  ___ No restrictions     ___ Walk up steps independently ___ 24/7 supervision/assistance   ___ Walk up steps with assistance ___ Intermittent supervision/assistance  ___ Bathe/dress independently ___ Walk with walker     _x__ Bathe/dress with assistance ___ Walk Independently    ___ Shower independently ___ Walk with assistance    ___ Shower with assistance ___ No alcohol     ___ Return to work/school ________  COMMUNITY REFERRALS UPON DISCHARGE:  Home Health: PT, [place] Equipment/Items Ordered:drop arm commode, amputee pad right  Agency/Supplier:[facility]  Special Instructions: No driving smoking or alcohol  Continue hemodialysis as directed  Continue vancomycin 750 mg every Monday Wednesday Friday with hemodialysis until 06/10/2019 and stop per infectious disease   My questions have been answered and I understand these instructions. I will adhere to these goals and the provided educational materials after my discharge from the hospital.  Patient/Caregiver Signature _______________________________ Date __________  Clinician Signature _______________________________________ Date __________  Please bring this form and your medication list with you to all your follow-up doctor's appointments.

## 2019-05-28 NOTE — Progress Notes (Signed)
Patient was given D/C instructions by Linna Hoff, PA. [facility] and wheelchair have been delivered to room. Patients belongings have been packed. Limb care instructions have been given to patient and daughter. No concerns to report at this time. Amanda Cockayne, LPN

## 2019-05-28 NOTE — Care Management (Signed)
   The overall goal for the admission was met for:   Discharge location: Home with daughter  Length of Stay: 10 days with discharge 05/28/19  Discharge activity level: Patient to discharge at overall Supervision level with ambulation and dynamic standing balance, Mod I w/c or seated level for bathing/dressing tasks.   Home/community participation: Clinical research associate provided included: MD, RD, PT, OT, RN, CM, TR, Pharmacy, Neuropsych and SW  Financial Services: Medicare [place] Medicare  Follow-up services arranged: [facility] PT,OT with [place] [phone]   Comments (or additional information):  Patient/Family verbalized understanding of follow-up arrangements: Yes Pt's daughter attended family education training on 3/16 and verbalized/demonstrated confidence with all tasks to ensure safe discharge home.   Individual responsible for coordination of the follow-up plan: Daughter Alysia Penna [phone]  Confirmed correct DME delivered: Ordered drop arm commode and right amputee pad from [facility]. Delivery pending copay. Patient is aware. Margarito Liner 05/28/2019    Margarito Liner

## 2019-05-29 ENCOUNTER — Encounter: Payer: Medicare Other | Admitting: Vascular Surgery

## 2019-05-29 DIAGNOSIS — Z23 Encounter for immunization: Secondary | ICD-10-CM | POA: Diagnosis not present

## 2019-05-29 DIAGNOSIS — A419 Sepsis, unspecified organism: Secondary | ICD-10-CM | POA: Diagnosis not present

## 2019-05-29 DIAGNOSIS — R7881 Bacteremia: Secondary | ICD-10-CM | POA: Diagnosis not present

## 2019-05-29 DIAGNOSIS — D688 Other specified coagulation defects: Secondary | ICD-10-CM | POA: Diagnosis not present

## 2019-05-29 DIAGNOSIS — N186 End stage renal disease: Secondary | ICD-10-CM | POA: Diagnosis not present

## 2019-05-29 DIAGNOSIS — N2581 Secondary hyperparathyroidism of renal origin: Secondary | ICD-10-CM | POA: Diagnosis not present

## 2019-05-29 DIAGNOSIS — D631 Anemia in chronic kidney disease: Secondary | ICD-10-CM | POA: Diagnosis not present

## 2019-05-29 DIAGNOSIS — Z89511 Acquired absence of right leg below knee: Secondary | ICD-10-CM | POA: Diagnosis not present

## 2019-05-29 DIAGNOSIS — Z992 Dependence on renal dialysis: Secondary | ICD-10-CM | POA: Diagnosis not present

## 2019-05-30 DIAGNOSIS — D631 Anemia in chronic kidney disease: Secondary | ICD-10-CM | POA: Diagnosis not present

## 2019-05-30 DIAGNOSIS — E785 Hyperlipidemia, unspecified: Secondary | ICD-10-CM | POA: Diagnosis not present

## 2019-05-30 DIAGNOSIS — I70221 Atherosclerosis of native arteries of extremities with rest pain, right leg: Secondary | ICD-10-CM | POA: Diagnosis not present

## 2019-05-30 DIAGNOSIS — N2581 Secondary hyperparathyroidism of renal origin: Secondary | ICD-10-CM | POA: Diagnosis not present

## 2019-05-30 DIAGNOSIS — Z48812 Encounter for surgical aftercare following surgery on the circulatory system: Secondary | ICD-10-CM | POA: Diagnosis not present

## 2019-05-30 DIAGNOSIS — E859 Amyloidosis, unspecified: Secondary | ICD-10-CM | POA: Diagnosis not present

## 2019-05-30 DIAGNOSIS — I2510 Atherosclerotic heart disease of native coronary artery without angina pectoris: Secondary | ICD-10-CM | POA: Diagnosis not present

## 2019-05-30 DIAGNOSIS — I132 Hypertensive heart and chronic kidney disease with heart failure and with stage 5 chronic kidney disease, or end stage renal disease: Secondary | ICD-10-CM | POA: Diagnosis not present

## 2019-05-30 DIAGNOSIS — N186 End stage renal disease: Secondary | ICD-10-CM | POA: Diagnosis not present

## 2019-05-30 DIAGNOSIS — I350 Nonrheumatic aortic (valve) stenosis: Secondary | ICD-10-CM | POA: Diagnosis not present

## 2019-05-30 DIAGNOSIS — I255 Ischemic cardiomyopathy: Secondary | ICD-10-CM | POA: Diagnosis not present

## 2019-05-30 DIAGNOSIS — I5042 Chronic combined systolic (congestive) and diastolic (congestive) heart failure: Secondary | ICD-10-CM | POA: Diagnosis not present

## 2019-05-30 DIAGNOSIS — D696 Thrombocytopenia, unspecified: Secondary | ICD-10-CM | POA: Diagnosis not present

## 2019-06-01 ENCOUNTER — Telehealth: Payer: Self-pay | Admitting: Registered Nurse

## 2019-06-01 DIAGNOSIS — R7881 Bacteremia: Secondary | ICD-10-CM | POA: Diagnosis not present

## 2019-06-01 DIAGNOSIS — Z23 Encounter for immunization: Secondary | ICD-10-CM | POA: Diagnosis not present

## 2019-06-01 DIAGNOSIS — D631 Anemia in chronic kidney disease: Secondary | ICD-10-CM | POA: Diagnosis not present

## 2019-06-01 DIAGNOSIS — N186 End stage renal disease: Secondary | ICD-10-CM | POA: Diagnosis not present

## 2019-06-01 DIAGNOSIS — D688 Other specified coagulation defects: Secondary | ICD-10-CM | POA: Diagnosis not present

## 2019-06-01 DIAGNOSIS — N2581 Secondary hyperparathyroidism of renal origin: Secondary | ICD-10-CM | POA: Diagnosis not present

## 2019-06-01 DIAGNOSIS — Z992 Dependence on renal dialysis: Secondary | ICD-10-CM | POA: Diagnosis not present

## 2019-06-01 NOTE — Telephone Encounter (Signed)
Transitional Care call Transitional Questions answered by [name]( Daughter)  Patient name: Jose Garrett DOB: August 19, 1950 1. Are you/is patient experiencing any problems since coming home? No a. Are there any questions regarding any aspect of care? No 2. Are there any questions regarding medications administration/dosing? No a. Are meds being taken as prescribed? Yes b. "Patient should review meds with caller to confirm" Medication List Reviewed. 3. Have there been any falls? No 4. Has Home Health been to the house and/or have they contacted you? Yes, [facility] a. If not, have you tried to contact them? NA b. Can we help you contact them? NA 5. Are bowels and bladder emptying properly? ESRD: M/W/F, he is moving his bowels.  a. Are there any unexpected incontinence issues? No b. If applicable, is patient following bowel/bladder programs? NA 6. Any fevers, problems with breathing, unexpected pain? No 7. Are there any skin problems or new areas of breakdown? No 8. Has the patient/family member arranged specialty MD follow up (ie cardiology/neurology/renal/surgical/etc.)?  [name] was instructed to call Dr. Trula Slade, Dr. Baxter Flattery and Dr Hollie Salk to schedule HFU appointments, she verbalizes understanding.  a. Can we help arrange? No 9. Does the patient need any other services or support that we can help arrange? No 10. Are caregivers following through as expected in assisting the patient? Yes 11. Has the patient quit smoking, drinking alcohol, or using drugs as recommended? (                        )  Appointment date/time 06/09/2019  arrival time 2:20 for a 2:40 appointment with Zella Ball L Thaomas ANP. At  [place]

## 2019-06-03 DIAGNOSIS — D631 Anemia in chronic kidney disease: Secondary | ICD-10-CM | POA: Diagnosis not present

## 2019-06-03 DIAGNOSIS — R7881 Bacteremia: Secondary | ICD-10-CM | POA: Diagnosis not present

## 2019-06-03 DIAGNOSIS — Z992 Dependence on renal dialysis: Secondary | ICD-10-CM | POA: Diagnosis not present

## 2019-06-03 DIAGNOSIS — Z23 Encounter for immunization: Secondary | ICD-10-CM | POA: Diagnosis not present

## 2019-06-03 DIAGNOSIS — N186 End stage renal disease: Secondary | ICD-10-CM | POA: Diagnosis not present

## 2019-06-03 DIAGNOSIS — N2581 Secondary hyperparathyroidism of renal origin: Secondary | ICD-10-CM | POA: Diagnosis not present

## 2019-06-03 DIAGNOSIS — D688 Other specified coagulation defects: Secondary | ICD-10-CM | POA: Diagnosis not present

## 2019-06-04 DIAGNOSIS — E859 Amyloidosis, unspecified: Secondary | ICD-10-CM | POA: Diagnosis not present

## 2019-06-04 DIAGNOSIS — I5042 Chronic combined systolic (congestive) and diastolic (congestive) heart failure: Secondary | ICD-10-CM | POA: Diagnosis not present

## 2019-06-04 DIAGNOSIS — I350 Nonrheumatic aortic (valve) stenosis: Secondary | ICD-10-CM | POA: Diagnosis not present

## 2019-06-04 DIAGNOSIS — D696 Thrombocytopenia, unspecified: Secondary | ICD-10-CM | POA: Diagnosis not present

## 2019-06-04 DIAGNOSIS — Z48812 Encounter for surgical aftercare following surgery on the circulatory system: Secondary | ICD-10-CM | POA: Diagnosis not present

## 2019-06-04 DIAGNOSIS — N2581 Secondary hyperparathyroidism of renal origin: Secondary | ICD-10-CM | POA: Diagnosis not present

## 2019-06-04 DIAGNOSIS — I70221 Atherosclerosis of native arteries of extremities with rest pain, right leg: Secondary | ICD-10-CM | POA: Diagnosis not present

## 2019-06-04 DIAGNOSIS — I132 Hypertensive heart and chronic kidney disease with heart failure and with stage 5 chronic kidney disease, or end stage renal disease: Secondary | ICD-10-CM | POA: Diagnosis not present

## 2019-06-04 DIAGNOSIS — D631 Anemia in chronic kidney disease: Secondary | ICD-10-CM | POA: Diagnosis not present

## 2019-06-04 DIAGNOSIS — E785 Hyperlipidemia, unspecified: Secondary | ICD-10-CM | POA: Diagnosis not present

## 2019-06-04 DIAGNOSIS — I255 Ischemic cardiomyopathy: Secondary | ICD-10-CM | POA: Diagnosis not present

## 2019-06-04 DIAGNOSIS — N186 End stage renal disease: Secondary | ICD-10-CM | POA: Diagnosis not present

## 2019-06-04 DIAGNOSIS — I2510 Atherosclerotic heart disease of native coronary artery without angina pectoris: Secondary | ICD-10-CM | POA: Diagnosis not present

## 2019-06-05 DIAGNOSIS — N2581 Secondary hyperparathyroidism of renal origin: Secondary | ICD-10-CM | POA: Diagnosis not present

## 2019-06-05 DIAGNOSIS — Z992 Dependence on renal dialysis: Secondary | ICD-10-CM | POA: Diagnosis not present

## 2019-06-05 DIAGNOSIS — N186 End stage renal disease: Secondary | ICD-10-CM | POA: Diagnosis not present

## 2019-06-05 DIAGNOSIS — D688 Other specified coagulation defects: Secondary | ICD-10-CM | POA: Diagnosis not present

## 2019-06-05 DIAGNOSIS — D631 Anemia in chronic kidney disease: Secondary | ICD-10-CM | POA: Diagnosis not present

## 2019-06-05 DIAGNOSIS — Z23 Encounter for immunization: Secondary | ICD-10-CM | POA: Diagnosis not present

## 2019-06-05 DIAGNOSIS — R7881 Bacteremia: Secondary | ICD-10-CM | POA: Diagnosis not present

## 2019-06-08 DIAGNOSIS — D631 Anemia in chronic kidney disease: Secondary | ICD-10-CM | POA: Diagnosis not present

## 2019-06-08 DIAGNOSIS — N186 End stage renal disease: Secondary | ICD-10-CM | POA: Diagnosis not present

## 2019-06-08 DIAGNOSIS — N2581 Secondary hyperparathyroidism of renal origin: Secondary | ICD-10-CM | POA: Diagnosis not present

## 2019-06-08 DIAGNOSIS — R7881 Bacteremia: Secondary | ICD-10-CM | POA: Diagnosis not present

## 2019-06-08 DIAGNOSIS — Z992 Dependence on renal dialysis: Secondary | ICD-10-CM | POA: Diagnosis not present

## 2019-06-08 DIAGNOSIS — D688 Other specified coagulation defects: Secondary | ICD-10-CM | POA: Diagnosis not present

## 2019-06-08 DIAGNOSIS — Z23 Encounter for immunization: Secondary | ICD-10-CM | POA: Diagnosis not present

## 2019-06-09 ENCOUNTER — Encounter: Payer: Medicare Other | Admitting: Registered Nurse

## 2019-06-09 DIAGNOSIS — E859 Amyloidosis, unspecified: Secondary | ICD-10-CM | POA: Diagnosis not present

## 2019-06-09 DIAGNOSIS — D631 Anemia in chronic kidney disease: Secondary | ICD-10-CM | POA: Diagnosis not present

## 2019-06-09 DIAGNOSIS — E785 Hyperlipidemia, unspecified: Secondary | ICD-10-CM | POA: Diagnosis not present

## 2019-06-09 DIAGNOSIS — I350 Nonrheumatic aortic (valve) stenosis: Secondary | ICD-10-CM | POA: Diagnosis not present

## 2019-06-09 DIAGNOSIS — N186 End stage renal disease: Secondary | ICD-10-CM | POA: Diagnosis not present

## 2019-06-09 DIAGNOSIS — I70221 Atherosclerosis of native arteries of extremities with rest pain, right leg: Secondary | ICD-10-CM | POA: Diagnosis not present

## 2019-06-09 DIAGNOSIS — N2581 Secondary hyperparathyroidism of renal origin: Secondary | ICD-10-CM | POA: Diagnosis not present

## 2019-06-09 DIAGNOSIS — I5042 Chronic combined systolic (congestive) and diastolic (congestive) heart failure: Secondary | ICD-10-CM | POA: Diagnosis not present

## 2019-06-09 DIAGNOSIS — I255 Ischemic cardiomyopathy: Secondary | ICD-10-CM | POA: Diagnosis not present

## 2019-06-09 DIAGNOSIS — I2510 Atherosclerotic heart disease of native coronary artery without angina pectoris: Secondary | ICD-10-CM | POA: Diagnosis not present

## 2019-06-09 DIAGNOSIS — D696 Thrombocytopenia, unspecified: Secondary | ICD-10-CM | POA: Diagnosis not present

## 2019-06-09 DIAGNOSIS — I132 Hypertensive heart and chronic kidney disease with heart failure and with stage 5 chronic kidney disease, or end stage renal disease: Secondary | ICD-10-CM | POA: Diagnosis not present

## 2019-06-09 DIAGNOSIS — Z48812 Encounter for surgical aftercare following surgery on the circulatory system: Secondary | ICD-10-CM | POA: Diagnosis not present

## 2019-06-10 ENCOUNTER — Encounter: Payer: Medicare Other | Admitting: Registered Nurse

## 2019-06-10 ENCOUNTER — Other Ambulatory Visit (HOSPITAL_COMMUNITY): Payer: Self-pay | Admitting: Physician Assistant

## 2019-06-10 DIAGNOSIS — D631 Anemia in chronic kidney disease: Secondary | ICD-10-CM | POA: Diagnosis not present

## 2019-06-10 DIAGNOSIS — D688 Other specified coagulation defects: Secondary | ICD-10-CM | POA: Diagnosis not present

## 2019-06-10 DIAGNOSIS — N186 End stage renal disease: Secondary | ICD-10-CM | POA: Diagnosis not present

## 2019-06-10 DIAGNOSIS — R7881 Bacteremia: Secondary | ICD-10-CM | POA: Diagnosis not present

## 2019-06-10 DIAGNOSIS — Z992 Dependence on renal dialysis: Secondary | ICD-10-CM | POA: Diagnosis not present

## 2019-06-10 DIAGNOSIS — N2581 Secondary hyperparathyroidism of renal origin: Secondary | ICD-10-CM | POA: Diagnosis not present

## 2019-06-10 DIAGNOSIS — I129 Hypertensive chronic kidney disease with stage 1 through stage 4 chronic kidney disease, or unspecified chronic kidney disease: Secondary | ICD-10-CM | POA: Diagnosis not present

## 2019-06-10 DIAGNOSIS — Z23 Encounter for immunization: Secondary | ICD-10-CM | POA: Diagnosis not present

## 2019-06-11 ENCOUNTER — Encounter: Payer: Medicare Other | Attending: Registered Nurse | Admitting: Registered Nurse

## 2019-06-11 ENCOUNTER — Other Ambulatory Visit: Payer: Self-pay

## 2019-06-11 ENCOUNTER — Encounter: Payer: Self-pay | Admitting: Registered Nurse

## 2019-06-11 ENCOUNTER — Telehealth: Payer: Self-pay

## 2019-06-11 VITALS — BP 130/75 | HR 69 | Ht 70.0 in | Wt 143.0 lb

## 2019-06-11 DIAGNOSIS — R269 Unspecified abnormalities of gait and mobility: Secondary | ICD-10-CM | POA: Diagnosis not present

## 2019-06-11 DIAGNOSIS — M62830 Muscle spasm of back: Secondary | ICD-10-CM | POA: Insufficient documentation

## 2019-06-11 DIAGNOSIS — Z992 Dependence on renal dialysis: Secondary | ICD-10-CM

## 2019-06-11 DIAGNOSIS — I5042 Chronic combined systolic (congestive) and diastolic (congestive) heart failure: Secondary | ICD-10-CM | POA: Diagnosis not present

## 2019-06-11 DIAGNOSIS — N186 End stage renal disease: Secondary | ICD-10-CM | POA: Insufficient documentation

## 2019-06-11 DIAGNOSIS — Z89511 Acquired absence of right leg below knee: Secondary | ICD-10-CM | POA: Diagnosis not present

## 2019-06-11 DIAGNOSIS — R7881 Bacteremia: Secondary | ICD-10-CM | POA: Diagnosis not present

## 2019-06-11 NOTE — Progress Notes (Signed)
Subjective:    Patient ID: VAL SCHIAVO, male    DOB: May 15, 1950, 69 y.o.   MRN: [id number]  HPI: Jose Garrett is a 69 y.o. male who is here for transitional care visit in follow up on his right below knee amputation, bacteremia, ESRD on hemodialysis and chronic combined systolic and diastolic congestive heart failure.  He presented to [facility] on 04/29/2019 for progressive gangrenous  Changes, he underwent a right transmetatarsal amputation on 04/29/2019 per Dr Donzetta Matters. Due to progressive gangraneous changes he underwent  Revision of right foot amputation on 05/05/2019 by Dr. Donzetta Matters and ultimately a Right BKA on 05/07/2019 by Dr. Trula Slade.   He was admitted to inpatient rehabilitation on 05/18/2019 and discharged home on 05/28/2019. He is receiving Home Health Therapy with [facility].   He states his pain is located in his right stump and lower back pain. He  rates his pain 6.  Reports he has a good appetite.   He arrived in wheelchair.    Pain Inventory Average Pain 6 Pain Right Now 6 My pain is intermittent and sharp  In the last 24 hours, has pain interfered with the following? General activity 4 Relation with others 7 Enjoyment of life 7 What TIME of day is your pain at its worst? all Sleep (in general) Poor  Pain is worse with: inactivity Pain improves with: medication Relief from Meds: 7  Mobility use a walker ability to climb steps?  no do you drive?  no use a wheelchair  Function I need assistance with the following:  shopping  Neuro/Psych spasms  Prior Studies   Physicians involved in your care Primary care . Neurologist .   Family History  Problem Relation Age of Onset  . Hypertension Mother   . Diabetes Mother   . Heart disease Mother   . Skin cancer Mother   . Hypertension Father   . Diabetes Father   . Diabetes Sister    Social History   Socioeconomic History  . Marital status: Divorced    Spouse name: Not on file  .  Number of children: Not on file  . Years of education: Not on file  . Highest education level: Not on file  Occupational History  . Not on file  Tobacco Use  . Smoking status: Former Smoker    Packs/day: 2.00    Years: 40.00    Pack years: 80.00    Types: Cigarettes    Quit date: 03/19/2011    Years since quitting: 8.2  . Smokeless tobacco: Never Used  Substance and Sexual Activity  . Alcohol use: No    Alcohol/week: 0.0 standard drinks  . Drug use: No    Comment:  "I have used marijuana till the 1990's". Notes that he quit 15 yrs ago  . Sexual activity: Not Currently  Other Topics Concern  . 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    Social Determinants of Health   Financial Resource Strain:   . Difficulty of Paying Living Expenses:   Food Insecurity:   . Worried About Charity fundraiser in the Last Year:   . Arboriculturist in the Last Year:   Transportation Needs:   . Film/video editor (Medical):   [name] Lack of Transportation (Non-Medical):   Physical  Activity:   . Days of Exercise per Week:   . Minutes of Exercise per Session:   Stress:   . Feeling of Stress :   Social Connections:   . Frequency of Communication with Friends and Family:   . Frequency of Social Gatherings with Friends and Family:   . Attends Religious Services:   . Active Member of Clubs or Organizations:   . Attends Archivist Meetings:   [name] Marital Status:    Past Surgical History:  Procedure Laterality Date  . ABDOMINAL AORTOGRAM W/LOWER EXTREMITY Bilateral 07/16/2018   Procedure: ABDOMINAL AORTOGRAM W/LOWER EXTREMITY;  Surgeon: Wellington Hampshire, MD;  Location: [place] [facility];  Service: Cardiovascular;  Laterality: Bilateral;  . ABDOMINAL AORTOGRAM W/LOWER EXTREMITY N/A 04/09/2019   Procedure: ABDOMINAL AORTOGRAM W/LOWER EXTREMITY - Right  Lower;  Surgeon: Waynetta Sandy, MD;  Location: [place] [facility];  Service: Cardiovascular;  Laterality: N/A;  . AMPUTATION Left 08/19/2018   Procedure: AMPUTATION LEFT TOES SECOND AND THIRD;  Surgeon: Waynetta Sandy, MD;  Location: [place];  Service: Vascular;  Laterality: Left;  . AMPUTATION Right 05/07/2019   Procedure: AMPUTATION BELOW KNEE;  Surgeon: Serafina Mitchell, MD;  Location: [facility] OR;  Service: Vascular;  Laterality: Right;  . AV FISTULA PLACEMENT Right 12/27/2015   Procedure: Right Arm ARTERIOVENOUS (AV) FISTULA CREATION;  Surgeon: Angelia Mould, MD;  Location: [place];  Service: Vascular;  Laterality: Right;  . CATARACT EXTRACTION W/ INTRAOCULAR LENS  IMPLANT, BILATERAL    . COLONOSCOPY    . ERCP W/ SPHICTEROTOMY    . FEMORAL-POPLITEAL BYPASS GRAFT Left 07/22/2018   Procedure: left FEMORAL TO BELOW KNEE POPLITEAL ARTERY BYPASS GREAFT using 76mm removable ring propaten gore graft;  Surgeon: Waynetta Sandy, MD;  Location: [place];  Service: Vascular;  Laterality: Left;  . FEMORAL-POPLITEAL BYPASS GRAFT Right 04/10/2019   Procedure: BYPASS GRAFT RIGHT FEMORAL-POPLITEAL ARTERY;  Surgeon: Rosetta Posner, MD;  Location: [place];  Service: Vascular;  Laterality: Right;  . I & D EXTREMITY Right 05/05/2019   Procedure: Revision of  RIGHT  FOOT Amputation;  Surgeon: Waynetta Sandy, MD;  Location: [place];  Service: Vascular;  Laterality: Right;  . INTRAOPERATIVE TRANSTHORACIC ECHOCARDIOGRAM N/A 10/21/2018   Procedure: Intraoperative Transthoracic Echocardiogram;  Surgeon: Sherren Mocha, MD;  Location: [place];  Service: Open Heart Surgery;  Laterality: N/A;  . IR GENERIC HISTORICAL  01/27/2016   IR FLUORO GUIDE CV LINE RIGHT 01/27/2016 Arne Cleveland, MD [facility]  . IR GENERIC HISTORICAL  01/27/2016   IR US GUIDE VASC ACCESS RIGHT 01/27/2016 Arne Cleveland, MD [facility]  . LAPAROSCOPIC CHOLECYSTECTOMY  03/2011  . LEFT HEART CATH AND CORONARY  ANGIOGRAPHY N/A 11/25/2017   Procedure: LEFT HEART CATH AND CORONARY ANGIOGRAPHY;  Surgeon: Martinique, Peter M, MD;  Location: [place] [facility];  Service: Cardiovascular;  Laterality: N/A;  . LEFT HEART CATH AND CORONARY ANGIOGRAPHY N/A 07/24/2018   Procedure: LEFT HEART CATH AND CORONARY ANGIOGRAPHY;  Surgeon: Sherren Mocha, MD;  Location: [place] [facility];  Service: Cardiovascular;  Laterality: N/A;  . RENAL BIOPSY, PERCUTANEOUS Right 09/08/2014  . RIGHT/LEFT HEART CATH AND CORONARY ANGIOGRAPHY N/A 03/25/2018   Procedure: RIGHT/LEFT HEART CATH AND CORONARY ANGIOGRAPHY;  Surgeon: Martinique, Peter M, MD;  Location: [place] [facility];  Service: Cardiovascular;  Laterality: N/A;  . TEE WITHOUT CARDIOVERSION N/A 05/14/2019   Procedure: TRANSESOPHAGEAL ECHOCARDIOGRAM (TEE);  Surgeon: Fay Records, MD;  Location: [place];  Service: Cardiovascular;  Laterality: N/A;  . TOE AMPUTATION Left 08/19/2018   2nd & 3rd toes  . TONSILLECTOMY  ~ 1960  . TRANSCATHETER AORTIC VALVE REPLACEMENT, TRANSFEMORAL N/A 10/21/2018   Procedure: TRANSCATHETER AORTIC VALVE REPLACEMENT, TRANSFEMORAL;  Surgeon: Sherren Mocha, MD;  Location: [place];  Service: Open Heart Surgery;  Laterality: N/A;  . TRANSMETATARSAL AMPUTATION Right 04/29/2019   Procedure: RIGHT TRANSMETATARSAL AMPUTATION;  Surgeon: Waynetta Sandy, MD;  Location: [place];  Service: Vascular;  Laterality: Right;  . ULTRASOUND GUIDANCE FOR VASCULAR ACCESS  03/25/2018   Procedure: Ultrasound Guidance For Vascular Access;  Surgeon: Martinique, Peter M, MD;  Location: [place] [facility];  Service: Cardiovascular;;   Past Medical History:  Diagnosis Date  . Amyloidosis ([place])   . Anemia of chronic disease   . Anxiety   . CAD (coronary artery disease)    a. cardiac cath on 11/25/17 with occlusion of the RCA which could have been his event but the RCA could not be engaged. The RCA filled distally from left to right collaterals. There were no severe stenoses in the  left coronary system. Medical therapy recommended, started on plavix.  . Chronic combined systolic and diastolic CHF (congestive heart failure) ([place])    a. EF dropped to 35-40% with grade 2 DD by echo 11/2017.  [name] DJD (degenerative joint disease), lumbar   . ESRD (end stage renal disease) ([place])    ESRD- HEMO MWF- [facility]  . GERD (gastroesophageal reflux disease)   . Hyperlipidemia   . Hypertension   . Myocardial infarction ([place])   . Neuropathy   . Peripheral vascular disease ([place])   . Pneumonia   . Restless legs   . S/P TAVR (transcatheter aortic valve replacement) 10/21/2018   26 mm Edwards Sapien 3 Ultra transcatheter heart valve placed via percutaneous right transfemoral approach   . Severe aortic stenosis    severe   BP 130/75   Pulse 69   Ht 5\' 10"  (1.778 m)   Wt 143 lb (64.9 kg)   SpO2 97%   BMI 20.52 kg/m   Opioid Risk Score:   Fall Risk Score:  `1  Depression screen PHQ 2/9  Depression screen [facility] 2/9 06/11/2019 04/22/2019 10/09/2018 09/11/2018 08/21/2018  Decreased Interest 0 0 0 1 1  Down, Depressed, Hopeless 0 1 0 1 1  PHQ - 2 Score 0 1 0 2 2  Altered sleeping 0 - 3 0 2  Tired, decreased energy 3 - 2 1 2   Change in appetite 0 - 0 0 0  Feeling bad or failure about yourself  0 - 0 0 1  Trouble concentrating 0 - 0 0 1  Moving slowly or fidgety/restless 0 - 0 1 1  Suicidal thoughts 0 - 0 0 0  PHQ-9 Score 3 - 5 4 9   Difficult doing work/chores - - Somewhat difficult Somewhat difficult Somewhat difficult  Some recent data might be hidden     Review of Systems  All other systems reviewed and are negative.      Objective:   Physical Exam Vitals and nursing note reviewed.  Constitutional:      Appearance: Normal appearance.  Cardiovascular:     Rate and Rhythm: Normal rate and regular rhythm.     Pulses: Normal pulses.     Heart sounds: Normal heart sounds.  Pulmonary:     Effort: Pulmonary effort is normal.     Breath sounds: Normal breath sounds.    Musculoskeletal:  Cervical back: Normal range of motion and neck supple.     Comments: Normal Muscle Bulk and Muscle Testing Reveals:  Upper Extremities: Full ROM and Muscle Strength 5/5  Lower Extremities: Right BKA: Dressing Change: Site cleansed Staples intact small opening with nor drainage or odor noted.  Wearing Knee Immobilizer Left: Full ROM and Muscle Strength 5/5 Arrived in wheelchair     Gait   Skin:    General: Skin is warm and dry.  Neurological:     Mental Status: He is alert and oriented to person, place, and time.  Psychiatric:        Mood and Affect: Mood normal.        Behavior: Behavior normal.           Assessment & Plan:  1. Right below knee amputation: Continue outpatient Therapy: Vascular Following.  2. Bacteremia: ID Following . Continue current Medication Regimen 3. ESRD on hemodialysis: Nephrology Following.  4.C hronic combined systolic and diastolic congestive heart failure. Continue current medication regimen. Cardiology Following.   20 minutes of face to face patient care time was spent during this visit. All questions were encouraged and answered.  F/U with Dr Posey Pronto in 4-6 weeks

## 2019-06-11 NOTE — Telephone Encounter (Signed)
Signed home health PT/OT ordered faxed to [place]

## 2019-06-12 DIAGNOSIS — N186 End stage renal disease: Secondary | ICD-10-CM | POA: Diagnosis not present

## 2019-06-12 DIAGNOSIS — D509 Iron deficiency anemia, unspecified: Secondary | ICD-10-CM | POA: Diagnosis not present

## 2019-06-12 DIAGNOSIS — N2581 Secondary hyperparathyroidism of renal origin: Secondary | ICD-10-CM | POA: Diagnosis not present

## 2019-06-12 DIAGNOSIS — D631 Anemia in chronic kidney disease: Secondary | ICD-10-CM | POA: Diagnosis not present

## 2019-06-12 DIAGNOSIS — D688 Other specified coagulation defects: Secondary | ICD-10-CM | POA: Diagnosis not present

## 2019-06-12 DIAGNOSIS — Z992 Dependence on renal dialysis: Secondary | ICD-10-CM | POA: Diagnosis not present

## 2019-06-13 DIAGNOSIS — I5042 Chronic combined systolic (congestive) and diastolic (congestive) heart failure: Secondary | ICD-10-CM | POA: Diagnosis not present

## 2019-06-13 DIAGNOSIS — N186 End stage renal disease: Secondary | ICD-10-CM | POA: Diagnosis not present

## 2019-06-13 DIAGNOSIS — N2581 Secondary hyperparathyroidism of renal origin: Secondary | ICD-10-CM | POA: Diagnosis not present

## 2019-06-13 DIAGNOSIS — I2510 Atherosclerotic heart disease of native coronary artery without angina pectoris: Secondary | ICD-10-CM | POA: Diagnosis not present

## 2019-06-13 DIAGNOSIS — I350 Nonrheumatic aortic (valve) stenosis: Secondary | ICD-10-CM | POA: Diagnosis not present

## 2019-06-13 DIAGNOSIS — D631 Anemia in chronic kidney disease: Secondary | ICD-10-CM | POA: Diagnosis not present

## 2019-06-13 DIAGNOSIS — I70221 Atherosclerosis of native arteries of extremities with rest pain, right leg: Secondary | ICD-10-CM | POA: Diagnosis not present

## 2019-06-13 DIAGNOSIS — I132 Hypertensive heart and chronic kidney disease with heart failure and with stage 5 chronic kidney disease, or end stage renal disease: Secondary | ICD-10-CM | POA: Diagnosis not present

## 2019-06-13 DIAGNOSIS — E785 Hyperlipidemia, unspecified: Secondary | ICD-10-CM | POA: Diagnosis not present

## 2019-06-13 DIAGNOSIS — E859 Amyloidosis, unspecified: Secondary | ICD-10-CM | POA: Diagnosis not present

## 2019-06-13 DIAGNOSIS — Z48812 Encounter for surgical aftercare following surgery on the circulatory system: Secondary | ICD-10-CM | POA: Diagnosis not present

## 2019-06-13 DIAGNOSIS — I255 Ischemic cardiomyopathy: Secondary | ICD-10-CM | POA: Diagnosis not present

## 2019-06-13 DIAGNOSIS — D696 Thrombocytopenia, unspecified: Secondary | ICD-10-CM | POA: Diagnosis not present

## 2019-06-15 DIAGNOSIS — I739 Peripheral vascular disease, unspecified: Secondary | ICD-10-CM | POA: Diagnosis not present

## 2019-06-15 DIAGNOSIS — D509 Iron deficiency anemia, unspecified: Secondary | ICD-10-CM | POA: Diagnosis not present

## 2019-06-15 DIAGNOSIS — N2581 Secondary hyperparathyroidism of renal origin: Secondary | ICD-10-CM | POA: Diagnosis not present

## 2019-06-15 DIAGNOSIS — N186 End stage renal disease: Secondary | ICD-10-CM | POA: Diagnosis not present

## 2019-06-15 DIAGNOSIS — Z992 Dependence on renal dialysis: Secondary | ICD-10-CM | POA: Diagnosis not present

## 2019-06-15 DIAGNOSIS — D688 Other specified coagulation defects: Secondary | ICD-10-CM | POA: Diagnosis not present

## 2019-06-15 DIAGNOSIS — D631 Anemia in chronic kidney disease: Secondary | ICD-10-CM | POA: Diagnosis not present

## 2019-06-16 DIAGNOSIS — M5136 Other intervertebral disc degeneration, lumbar region: Secondary | ICD-10-CM | POA: Diagnosis not present

## 2019-06-16 DIAGNOSIS — E859 Amyloidosis, unspecified: Secondary | ICD-10-CM | POA: Diagnosis not present

## 2019-06-16 DIAGNOSIS — N186 End stage renal disease: Secondary | ICD-10-CM | POA: Diagnosis not present

## 2019-06-16 DIAGNOSIS — N2581 Secondary hyperparathyroidism of renal origin: Secondary | ICD-10-CM | POA: Diagnosis not present

## 2019-06-16 DIAGNOSIS — I70221 Atherosclerosis of native arteries of extremities with rest pain, right leg: Secondary | ICD-10-CM | POA: Diagnosis not present

## 2019-06-16 DIAGNOSIS — I2510 Atherosclerotic heart disease of native coronary artery without angina pectoris: Secondary | ICD-10-CM | POA: Diagnosis not present

## 2019-06-16 DIAGNOSIS — I951 Orthostatic hypotension: Secondary | ICD-10-CM | POA: Diagnosis not present

## 2019-06-16 DIAGNOSIS — I5042 Chronic combined systolic (congestive) and diastolic (congestive) heart failure: Secondary | ICD-10-CM | POA: Diagnosis not present

## 2019-06-16 DIAGNOSIS — I255 Ischemic cardiomyopathy: Secondary | ICD-10-CM | POA: Diagnosis not present

## 2019-06-16 DIAGNOSIS — I350 Nonrheumatic aortic (valve) stenosis: Secondary | ICD-10-CM | POA: Diagnosis not present

## 2019-06-16 DIAGNOSIS — I132 Hypertensive heart and chronic kidney disease with heart failure and with stage 5 chronic kidney disease, or end stage renal disease: Secondary | ICD-10-CM | POA: Diagnosis not present

## 2019-06-16 DIAGNOSIS — D631 Anemia in chronic kidney disease: Secondary | ICD-10-CM | POA: Diagnosis not present

## 2019-06-16 DIAGNOSIS — Z4781 Encounter for orthopedic aftercare following surgical amputation: Secondary | ICD-10-CM | POA: Diagnosis not present

## 2019-06-17 ENCOUNTER — Encounter: Payer: Self-pay | Admitting: Registered Nurse

## 2019-06-17 DIAGNOSIS — D631 Anemia in chronic kidney disease: Secondary | ICD-10-CM | POA: Diagnosis not present

## 2019-06-17 DIAGNOSIS — N186 End stage renal disease: Secondary | ICD-10-CM | POA: Diagnosis not present

## 2019-06-17 DIAGNOSIS — D688 Other specified coagulation defects: Secondary | ICD-10-CM | POA: Diagnosis not present

## 2019-06-17 DIAGNOSIS — Z992 Dependence on renal dialysis: Secondary | ICD-10-CM | POA: Diagnosis not present

## 2019-06-17 DIAGNOSIS — D509 Iron deficiency anemia, unspecified: Secondary | ICD-10-CM | POA: Diagnosis not present

## 2019-06-17 DIAGNOSIS — N2581 Secondary hyperparathyroidism of renal origin: Secondary | ICD-10-CM | POA: Diagnosis not present

## 2019-06-18 DIAGNOSIS — I350 Nonrheumatic aortic (valve) stenosis: Secondary | ICD-10-CM | POA: Diagnosis not present

## 2019-06-18 DIAGNOSIS — I5042 Chronic combined systolic (congestive) and diastolic (congestive) heart failure: Secondary | ICD-10-CM | POA: Diagnosis not present

## 2019-06-18 DIAGNOSIS — N2581 Secondary hyperparathyroidism of renal origin: Secondary | ICD-10-CM | POA: Diagnosis not present

## 2019-06-18 DIAGNOSIS — I255 Ischemic cardiomyopathy: Secondary | ICD-10-CM | POA: Diagnosis not present

## 2019-06-18 DIAGNOSIS — I951 Orthostatic hypotension: Secondary | ICD-10-CM | POA: Diagnosis not present

## 2019-06-18 DIAGNOSIS — E859 Amyloidosis, unspecified: Secondary | ICD-10-CM | POA: Diagnosis not present

## 2019-06-18 DIAGNOSIS — I132 Hypertensive heart and chronic kidney disease with heart failure and with stage 5 chronic kidney disease, or end stage renal disease: Secondary | ICD-10-CM | POA: Diagnosis not present

## 2019-06-18 DIAGNOSIS — N186 End stage renal disease: Secondary | ICD-10-CM | POA: Diagnosis not present

## 2019-06-18 DIAGNOSIS — I70221 Atherosclerosis of native arteries of extremities with rest pain, right leg: Secondary | ICD-10-CM | POA: Diagnosis not present

## 2019-06-18 DIAGNOSIS — D631 Anemia in chronic kidney disease: Secondary | ICD-10-CM | POA: Diagnosis not present

## 2019-06-18 DIAGNOSIS — Z4781 Encounter for orthopedic aftercare following surgical amputation: Secondary | ICD-10-CM | POA: Diagnosis not present

## 2019-06-18 DIAGNOSIS — I2510 Atherosclerotic heart disease of native coronary artery without angina pectoris: Secondary | ICD-10-CM | POA: Diagnosis not present

## 2019-06-18 DIAGNOSIS — M5136 Other intervertebral disc degeneration, lumbar region: Secondary | ICD-10-CM | POA: Diagnosis not present

## 2019-06-19 DIAGNOSIS — D631 Anemia in chronic kidney disease: Secondary | ICD-10-CM | POA: Diagnosis not present

## 2019-06-19 DIAGNOSIS — Z992 Dependence on renal dialysis: Secondary | ICD-10-CM | POA: Diagnosis not present

## 2019-06-19 DIAGNOSIS — N2581 Secondary hyperparathyroidism of renal origin: Secondary | ICD-10-CM | POA: Diagnosis not present

## 2019-06-19 DIAGNOSIS — N186 End stage renal disease: Secondary | ICD-10-CM | POA: Diagnosis not present

## 2019-06-19 DIAGNOSIS — D688 Other specified coagulation defects: Secondary | ICD-10-CM | POA: Diagnosis not present

## 2019-06-19 DIAGNOSIS — D509 Iron deficiency anemia, unspecified: Secondary | ICD-10-CM | POA: Diagnosis not present

## 2019-06-22 DIAGNOSIS — N2581 Secondary hyperparathyroidism of renal origin: Secondary | ICD-10-CM | POA: Diagnosis not present

## 2019-06-22 DIAGNOSIS — Z992 Dependence on renal dialysis: Secondary | ICD-10-CM | POA: Diagnosis not present

## 2019-06-22 DIAGNOSIS — D509 Iron deficiency anemia, unspecified: Secondary | ICD-10-CM | POA: Diagnosis not present

## 2019-06-22 DIAGNOSIS — D631 Anemia in chronic kidney disease: Secondary | ICD-10-CM | POA: Diagnosis not present

## 2019-06-22 DIAGNOSIS — D688 Other specified coagulation defects: Secondary | ICD-10-CM | POA: Diagnosis not present

## 2019-06-22 DIAGNOSIS — N186 End stage renal disease: Secondary | ICD-10-CM | POA: Diagnosis not present

## 2019-06-24 DIAGNOSIS — N186 End stage renal disease: Secondary | ICD-10-CM | POA: Diagnosis not present

## 2019-06-24 DIAGNOSIS — D509 Iron deficiency anemia, unspecified: Secondary | ICD-10-CM | POA: Diagnosis not present

## 2019-06-24 DIAGNOSIS — D631 Anemia in chronic kidney disease: Secondary | ICD-10-CM | POA: Diagnosis not present

## 2019-06-24 DIAGNOSIS — D688 Other specified coagulation defects: Secondary | ICD-10-CM | POA: Diagnosis not present

## 2019-06-24 DIAGNOSIS — Z992 Dependence on renal dialysis: Secondary | ICD-10-CM | POA: Diagnosis not present

## 2019-06-24 DIAGNOSIS — N2581 Secondary hyperparathyroidism of renal origin: Secondary | ICD-10-CM | POA: Diagnosis not present

## 2019-06-25 ENCOUNTER — Other Ambulatory Visit: Payer: Self-pay

## 2019-06-25 ENCOUNTER — Encounter: Payer: Self-pay | Admitting: Infectious Diseases

## 2019-06-25 ENCOUNTER — Ambulatory Visit: Payer: Medicare Other | Admitting: Infectious Diseases

## 2019-06-25 VITALS — BP 143/63 | HR 68 | Temp 97.6°F

## 2019-06-25 DIAGNOSIS — Z4781 Encounter for orthopedic aftercare following surgical amputation: Secondary | ICD-10-CM | POA: Diagnosis not present

## 2019-06-25 DIAGNOSIS — E859 Amyloidosis, unspecified: Secondary | ICD-10-CM | POA: Diagnosis not present

## 2019-06-25 DIAGNOSIS — N186 End stage renal disease: Secondary | ICD-10-CM | POA: Diagnosis not present

## 2019-06-25 DIAGNOSIS — R7881 Bacteremia: Secondary | ICD-10-CM | POA: Diagnosis not present

## 2019-06-25 DIAGNOSIS — B952 Enterococcus as the cause of diseases classified elsewhere: Secondary | ICD-10-CM | POA: Diagnosis not present

## 2019-06-25 DIAGNOSIS — Z952 Presence of prosthetic heart valve: Secondary | ICD-10-CM

## 2019-06-25 DIAGNOSIS — I5042 Chronic combined systolic (congestive) and diastolic (congestive) heart failure: Secondary | ICD-10-CM | POA: Diagnosis not present

## 2019-06-25 DIAGNOSIS — I350 Nonrheumatic aortic (valve) stenosis: Secondary | ICD-10-CM | POA: Diagnosis not present

## 2019-06-25 DIAGNOSIS — M5136 Other intervertebral disc degeneration, lumbar region: Secondary | ICD-10-CM | POA: Diagnosis not present

## 2019-06-25 DIAGNOSIS — I2510 Atherosclerotic heart disease of native coronary artery without angina pectoris: Secondary | ICD-10-CM | POA: Diagnosis not present

## 2019-06-25 DIAGNOSIS — I70221 Atherosclerosis of native arteries of extremities with rest pain, right leg: Secondary | ICD-10-CM | POA: Diagnosis not present

## 2019-06-25 DIAGNOSIS — N2581 Secondary hyperparathyroidism of renal origin: Secondary | ICD-10-CM | POA: Diagnosis not present

## 2019-06-25 DIAGNOSIS — I132 Hypertensive heart and chronic kidney disease with heart failure and with stage 5 chronic kidney disease, or end stage renal disease: Secondary | ICD-10-CM | POA: Diagnosis not present

## 2019-06-25 DIAGNOSIS — I951 Orthostatic hypotension: Secondary | ICD-10-CM | POA: Diagnosis not present

## 2019-06-25 DIAGNOSIS — D631 Anemia in chronic kidney disease: Secondary | ICD-10-CM | POA: Diagnosis not present

## 2019-06-25 DIAGNOSIS — I255 Ischemic cardiomyopathy: Secondary | ICD-10-CM | POA: Diagnosis not present

## 2019-06-25 NOTE — Assessment & Plan Note (Addendum)
Enterococcal bacteremia with unverified source now s/p 4 weeks treatment with IV vancomycin s/p HD sessions. He has had no recurrent fevers on or after therapy. Feeling well aside from generalized fatigue. Will repeat surveillance blood cultures today given prosthetic valve in situ. ?transient bacteremia in the setting of diarrhea but unclear. Discussed  Return as needed for now but will call in 1 week once blood cultures are final.

## 2019-06-25 NOTE — Progress Notes (Signed)
Patient: Jose Garrett  DOB: 08/05/1950 MRN: [id number] PCP: [name], MD    Patient Active Problem List   Diagnosis Date Noted  . Sleep disturbance   . Nausea without vomiting   . Orthostatic hypotension   . Acute on chronic anemia   . Chronic combined systolic and diastolic congestive heart failure ([place])   . ESRD on dialysis ([place])   . Right below-knee amputee ([place]) 05/18/2019  . Pressure injury of skin 05/14/2019  . Enterococcal bacteremia 05/13/2019  . Polymorphic ventricular tachycardia ([place]) 10/24/2018  . S/P TAVR (transcatheter aortic valve replacement) 10/21/2018  . Anxiety   . Coronary artery disease involving native coronary artery of native heart without angina pectoris   . Thrombocytopenia ([place])   . PAD (peripheral artery disease) ([place]) 07/22/2018  . Acute on chronic combined systolic and diastolic CHF (congestive heart failure) ([place]) 11/30/2017  . ESRD (end stage renal disease) on dialysis ([place])   . Iron deficiency anemia 11/22/2015  . AL amyloidosis ([place]) 09/25/2014  . Anemia of chronic disease 09/25/2014  . Essential hypertension, benign 11/06/2013     Subjective:  Jose Garrett is a 69 y.o. male here for hospital follow up for Enterococcal bacteremia. S/P TAVR Aug-2020. ESRD on chronic HD via AVF. S/P R BKA Feb 25/21.   He had isolated fever March 1st and required admission to the hospital. Found to have enterococcal bacteremia. TEE was obtained and found to have no concern for infection involving his TAVR valve or other native heart valves.  He was given 4 weeks of Vancomycin with HD that was completed 06/10/2019.   He is here today for 2 week follow up since stopping antibiotics. He has unfortunately had 2 falls at home during attempts to transfer to chair from wheelchair. He has some cuts and bruises to the hands but no significant injury. His amputation site is healed up with only a few scabs remaining on the incision line - he has follow up with Dr.  Vella Redhead tomorrow and hopeful to be considered for prosthetic at that time.   He has had no fevers, chills, night sweats to his knowledge. He has received his first Moderna COVID vaccine 2 weeks ago and due for the next in 2 weeks. He has not felt 100% himself but feels this is more due to dialysis as this has taken a huge toll on him and his energy.   He remains anuric and without any bladder symptoms; no diarrhea, abdominal pain. No new wounds. He does recall he had a bout of diarrhea around the time when he originally got sick.  No trouble with SOB, cough, chest pain or LE edema.    Review of Systems  All other systems reviewed and are negative.   Past Medical History:  Diagnosis Date  . Amyloidosis ([place])   . Anemia of chronic disease   . Anxiety   . CAD (coronary artery disease)    a. cardiac cath on 11/25/17 with occlusion of the RCA which could have been his event but the RCA could not be engaged. The RCA filled distally from left to right collaterals. There were no severe stenoses in the left coronary system. Medical therapy recommended, started on plavix.  . Chronic combined systolic and diastolic CHF (congestive heart failure) ([place])    a. EF dropped to 35-40% with grade 2 DD by echo 11/2017.  [name] DJD (degenerative joint disease), lumbar   . ESRD (end stage renal disease) ([place])  ESRD- HEMO MWF- [facility]  . GERD (gastroesophageal reflux disease)   . Hyperlipidemia   . Hypertension   . Myocardial infarction ([place])   . Neuropathy   . Peripheral vascular disease ([place])   . Pneumonia   . Restless legs   . S/P TAVR (transcatheter aortic valve replacement) 10/21/2018   26 mm Edwards Sapien 3 Ultra transcatheter heart valve placed via percutaneous right transfemoral approach   . Severe aortic stenosis    severe    Outpatient Medications Prior to Visit  Medication Sig Dispense Refill  . acetaminophen (TYLENOL) 325 MG tablet Take 1-2 tablets (325-650 mg total) by mouth every 4  (four) hours as needed for mild pain (or temp >/= 101 F).    [name] amiodarone (PACERONE) 200 MG tablet Take 1 tablet (200 mg total) by mouth daily. 90 tablet 2  . atorvastatin (LIPITOR) 80 MG tablet Take 1 tablet (80 mg total) by mouth daily with supper. 30 tablet 0  . calcitRIOL (ROCALTROL) 0.5 MCG capsule Take 3 capsules (1.5 mcg total) by mouth every Monday, Wednesday, and Friday with hemodialysis. 30 capsule 0  . clopidogrel (PLAVIX) 75 MG tablet Take 1 tablet (75 mg total) by mouth daily with supper. 90 tablet 2  . Darbepoetin Alfa (ARANESP) 100 MCG/0.5ML SOSY injection Inject 0.5 mLs (100 mcg total) into the vein every Friday with hemodialysis. 4.2 mL   . docusate sodium (COLACE) 100 MG capsule Take 1 capsule (100 mg total) by mouth 2 (two) times daily. 10 capsule 0  . LORazepam (ATIVAN) 1 MG tablet Take 1 tablet (1 mg total) by mouth 2 (two) times daily as needed for anxiety. 20 tablet 0  . Melatonin 3 MG TABS Take 0.5 tablets (1.5 mg total) by mouth at bedtime. 30 tablet 0  . midodrine (PROAMATINE) 10 MG tablet Take 1 tablet (10 mg total) by mouth every Monday, Wednesday, and Friday with hemodialysis. 30 tablet 0  . multivitamin (RENA-VIT) TABS tablet Take 1 tablet by mouth at bedtime. (Patient taking differently: Take 1 tablet by mouth daily with breakfast. ) 30 tablet 0  . oxyCODONE (OXY IR/ROXICODONE) 5 MG immediate release tablet Take 1-2 tablets (5-10 mg total) by mouth every 4 (four) hours as needed for moderate pain. 30 tablet 0  . pantoprazole (PROTONIX) 40 MG tablet Take 1 tablet (40 mg total) by mouth daily. 30 tablet 0  . sucroferric oxyhydroxide (VELPHORO) 500 MG chewable tablet Chew 3 tablets (1,500 mg total) by mouth 3 (three) times daily with meals. 120 tablet 0  . Vancomycin (VANCOCIN) 750-5 MG/150ML-% SOLN Inject 150 mLs (750 mg total) into the vein every Monday, Wednesday, and Friday with hemodialysis. 4000 mL 0  . vitamin B-12 (CYANOCOBALAMIN) 1000 MCG tablet Take 1 tablet  (1,000 mcg total) by mouth every evening. 30 tablet 0  . carvedilol (COREG) 3.125 MG tablet Take 1 tablet (3.125 mg total) by mouth 2 (two) times daily. (Patient not taking: Reported on 06/25/2019) 180 tablet 3   No facility-administered medications prior to visit.     No Known Allergies  Social History   Tobacco Use  . Smoking status: Former Smoker    Packs/day: 2.00    Years: 40.00    Pack years: 80.00    Types: Cigarettes    Quit date: 03/19/2011    Years since quitting: 8.2  . Smokeless tobacco: Never Used  Substance Use Topics  . Alcohol use: No    Alcohol/week: 0.0 standard drinks  . Drug use: No  Comment:  "I have used marijuana till the 1990's". Notes that he quit 15 yrs ago    Family History  Problem Relation Age of Onset  . Hypertension Mother   . Diabetes Mother   . Heart disease Mother   . Skin cancer Mother   . Hypertension Father   . Diabetes Father   . Diabetes Sister     Objective:   Vitals:   06/25/19 1108  BP: (!) 143/63  Pulse: 68  Temp: 97.6 F (36.4 C)  SpO2: 97%   There is no height or weight on file to calculate BMI.  Physical Exam Vitals reviewed.  Constitutional:      Appearance: He is well-developed.     Comments: Seated comfortably in wheel chair during visit   HENT:     Mouth/Throat:     Mouth: Mucous membranes are moist.     Dentition: Normal dentition. No dental abscesses.     Pharynx: Oropharynx is clear.  Eyes:     General: No scleral icterus.    Pupils: Pupils are equal, round, and reactive to light.  Cardiovascular:     Rate and Rhythm: Normal rate and regular rhythm.     Heart sounds: Murmur (soft 1/6 systolic murmur LSB) present.  Pulmonary:     Effort: Pulmonary effort is normal.     Breath sounds: Normal breath sounds.  Abdominal:     General: There is no distension.     Palpations: Abdomen is soft.     Tenderness: There is no abdominal tenderness.  Lymphadenopathy:     Cervical: No cervical adenopathy.    Skin:    General: Skin is warm and dry.     Findings: No rash.     Comments: Scattered areas of bruising and healing scabs to arms.   Neurological:     Mental Status: He is alert and oriented to person, place, and time.  Psychiatric:        Judgment: Judgment normal.     Comments: In good spirits today.      Lab Results: Lab Results  Component Value Date   WBC 4.5 05/27/2019   HGB 8.2 (L) 05/27/2019   HCT 26.5 (L) 05/27/2019   MCV 103.5 (H) 05/27/2019   PLT 97 (L) 05/27/2019    Lab Results  Component Value Date   CREATININE 7.39 (H) 05/27/2019   BUN 27 (H) 05/27/2019   NA 135 05/27/2019   K 4.1 05/27/2019   CL 95 (L) 05/27/2019   CO2 25 05/27/2019    Lab Results  Component Value Date   ALT 7 04/29/2019   AST 17 04/29/2019   ALKPHOS 63 04/29/2019   BILITOT 1.3 (H) 04/29/2019     Assessment & Plan:   Problem List Items Addressed This Visit      Unprioritized   S/P TAVR (transcatheter aortic valve replacement) - Primary    TEE with normal function of valve and expected gradient. Seems to be doing well today. [name] He has follow up with TAVR team in a few months for regular follow up.       Enterococcal bacteremia    Enterococcal bacteremia with unverified source now s/p 4 weeks treatment with IV vancomycin s/p HD sessions. He has had no recurrent fevers on or after therapy. Feeling well aside from generalized fatigue. Will repeat surveillance blood cultures today given prosthetic valve in situ. ?transient bacteremia in the setting of diarrhea but unclear. Discussed  Return as needed for now but will  call in 1 week once blood cultures are final.       Relevant Orders   Culture, blood (single)   Culture, blood (single)      Janene Madeira, MSN, NP-C [facility] for Infectious [place] Pager: [phone] Office: [phone]  06/25/19  2:45 PM

## 2019-06-25 NOTE — Assessment & Plan Note (Signed)
TEE with normal function of valve and expected gradient. Seems to be doing well today. Jose Garrett He has follow up with TAVR team in a few months for regular follow up.

## 2019-06-25 NOTE — Patient Instructions (Addendum)
Very nice to see you today.   I am very encouraged that your infection has been cured - we treated you for a month to be cautious and protect your heart valve.   I would like to repeat your blood cultures today if we can; They will take a week to finalize so if you don't hear from me right away that is a good sign!

## 2019-06-26 ENCOUNTER — Ambulatory Visit (INDEPENDENT_AMBULATORY_CARE_PROVIDER_SITE_OTHER): Payer: Self-pay | Admitting: Vascular Surgery

## 2019-06-26 ENCOUNTER — Encounter: Payer: Self-pay | Admitting: Vascular Surgery

## 2019-06-26 VITALS — BP 135/82 | HR 83 | Temp 97.9°F | Resp 20 | Ht 70.0 in | Wt 143.0 lb

## 2019-06-26 DIAGNOSIS — Z992 Dependence on renal dialysis: Secondary | ICD-10-CM | POA: Diagnosis not present

## 2019-06-26 DIAGNOSIS — N2581 Secondary hyperparathyroidism of renal origin: Secondary | ICD-10-CM | POA: Diagnosis not present

## 2019-06-26 DIAGNOSIS — N186 End stage renal disease: Secondary | ICD-10-CM | POA: Diagnosis not present

## 2019-06-26 DIAGNOSIS — D688 Other specified coagulation defects: Secondary | ICD-10-CM | POA: Diagnosis not present

## 2019-06-26 DIAGNOSIS — I739 Peripheral vascular disease, unspecified: Secondary | ICD-10-CM

## 2019-06-26 DIAGNOSIS — D509 Iron deficiency anemia, unspecified: Secondary | ICD-10-CM | POA: Diagnosis not present

## 2019-06-26 DIAGNOSIS — D631 Anemia in chronic kidney disease: Secondary | ICD-10-CM | POA: Diagnosis not present

## 2019-06-26 NOTE — Progress Notes (Signed)
    Subjective:     Patient ID: Jose Garrett, male   DOB: 08-Jul-1950, 69 y.o.   MRN: [id number]  HPI 69 year old male status post right below-knee amputation.  This was after right lower extremity bypass and also transmetatarsal amputation that failed.  He also has previous left lower extremity bypass.  Currently dialyzes via right arm AV fistula.   Review of Systems Phantom pain    Objective:   Physical Exam  Vitals:   06/26/19 1520  BP: 135/82  Pulse: 83  Resp: 20  Temp: 97.9 F (36.6 C)  SpO2: 95%   Awake alert oriented On the respirations Strong thrill right upper extremity Right below-knee amputation healing well staples removed today Left second and third toe amputation sites well-healed and sutures removed.    Assessment/plan      69 year old male status post right below-knee amputation.  He will need his left lower extremity bypass evaluated with duplex and ABIs.  We will get this done in the next couple weeks.  He is okay for some strength on the right below-knee amputation site.    Brandon C. Donzetta Matters, MD [facility] Office: [phone]

## 2019-06-29 ENCOUNTER — Other Ambulatory Visit: Payer: Self-pay | Admitting: *Deleted

## 2019-06-29 DIAGNOSIS — I779 Disorder of arteries and arterioles, unspecified: Secondary | ICD-10-CM

## 2019-06-29 DIAGNOSIS — N2581 Secondary hyperparathyroidism of renal origin: Secondary | ICD-10-CM | POA: Diagnosis not present

## 2019-06-29 DIAGNOSIS — Z992 Dependence on renal dialysis: Secondary | ICD-10-CM | POA: Diagnosis not present

## 2019-06-29 DIAGNOSIS — D509 Iron deficiency anemia, unspecified: Secondary | ICD-10-CM | POA: Diagnosis not present

## 2019-06-29 DIAGNOSIS — N186 End stage renal disease: Secondary | ICD-10-CM | POA: Diagnosis not present

## 2019-06-29 DIAGNOSIS — D631 Anemia in chronic kidney disease: Secondary | ICD-10-CM | POA: Diagnosis not present

## 2019-06-29 DIAGNOSIS — I739 Peripheral vascular disease, unspecified: Secondary | ICD-10-CM

## 2019-06-29 DIAGNOSIS — D688 Other specified coagulation defects: Secondary | ICD-10-CM | POA: Diagnosis not present

## 2019-06-30 DIAGNOSIS — N2581 Secondary hyperparathyroidism of renal origin: Secondary | ICD-10-CM | POA: Diagnosis not present

## 2019-06-30 DIAGNOSIS — I255 Ischemic cardiomyopathy: Secondary | ICD-10-CM | POA: Diagnosis not present

## 2019-06-30 DIAGNOSIS — D631 Anemia in chronic kidney disease: Secondary | ICD-10-CM | POA: Diagnosis not present

## 2019-06-30 DIAGNOSIS — Z4781 Encounter for orthopedic aftercare following surgical amputation: Secondary | ICD-10-CM | POA: Diagnosis not present

## 2019-06-30 DIAGNOSIS — I132 Hypertensive heart and chronic kidney disease with heart failure and with stage 5 chronic kidney disease, or end stage renal disease: Secondary | ICD-10-CM | POA: Diagnosis not present

## 2019-06-30 DIAGNOSIS — I2510 Atherosclerotic heart disease of native coronary artery without angina pectoris: Secondary | ICD-10-CM | POA: Diagnosis not present

## 2019-06-30 DIAGNOSIS — I70221 Atherosclerosis of native arteries of extremities with rest pain, right leg: Secondary | ICD-10-CM | POA: Diagnosis not present

## 2019-06-30 DIAGNOSIS — I951 Orthostatic hypotension: Secondary | ICD-10-CM | POA: Diagnosis not present

## 2019-06-30 DIAGNOSIS — M5136 Other intervertebral disc degeneration, lumbar region: Secondary | ICD-10-CM | POA: Diagnosis not present

## 2019-06-30 DIAGNOSIS — E859 Amyloidosis, unspecified: Secondary | ICD-10-CM | POA: Diagnosis not present

## 2019-06-30 DIAGNOSIS — I350 Nonrheumatic aortic (valve) stenosis: Secondary | ICD-10-CM | POA: Diagnosis not present

## 2019-06-30 DIAGNOSIS — I5042 Chronic combined systolic (congestive) and diastolic (congestive) heart failure: Secondary | ICD-10-CM | POA: Diagnosis not present

## 2019-06-30 DIAGNOSIS — N186 End stage renal disease: Secondary | ICD-10-CM | POA: Diagnosis not present

## 2019-07-01 DIAGNOSIS — D509 Iron deficiency anemia, unspecified: Secondary | ICD-10-CM | POA: Diagnosis not present

## 2019-07-01 DIAGNOSIS — Z992 Dependence on renal dialysis: Secondary | ICD-10-CM | POA: Diagnosis not present

## 2019-07-01 DIAGNOSIS — N2581 Secondary hyperparathyroidism of renal origin: Secondary | ICD-10-CM | POA: Diagnosis not present

## 2019-07-01 DIAGNOSIS — D688 Other specified coagulation defects: Secondary | ICD-10-CM | POA: Diagnosis not present

## 2019-07-01 DIAGNOSIS — D631 Anemia in chronic kidney disease: Secondary | ICD-10-CM | POA: Diagnosis not present

## 2019-07-01 DIAGNOSIS — N186 End stage renal disease: Secondary | ICD-10-CM | POA: Diagnosis not present

## 2019-07-01 LAB — CULTURE, BLOOD (SINGLE)
MICRO NUMBER:: 10372806
MICRO NUMBER:: 10372807

## 2019-07-02 DIAGNOSIS — N186 End stage renal disease: Secondary | ICD-10-CM | POA: Diagnosis not present

## 2019-07-02 DIAGNOSIS — N2581 Secondary hyperparathyroidism of renal origin: Secondary | ICD-10-CM | POA: Diagnosis not present

## 2019-07-02 DIAGNOSIS — I951 Orthostatic hypotension: Secondary | ICD-10-CM | POA: Diagnosis not present

## 2019-07-02 DIAGNOSIS — D631 Anemia in chronic kidney disease: Secondary | ICD-10-CM | POA: Diagnosis not present

## 2019-07-02 DIAGNOSIS — M5136 Other intervertebral disc degeneration, lumbar region: Secondary | ICD-10-CM | POA: Diagnosis not present

## 2019-07-02 DIAGNOSIS — I5042 Chronic combined systolic (congestive) and diastolic (congestive) heart failure: Secondary | ICD-10-CM | POA: Diagnosis not present

## 2019-07-02 DIAGNOSIS — I255 Ischemic cardiomyopathy: Secondary | ICD-10-CM | POA: Diagnosis not present

## 2019-07-02 DIAGNOSIS — I350 Nonrheumatic aortic (valve) stenosis: Secondary | ICD-10-CM | POA: Diagnosis not present

## 2019-07-02 DIAGNOSIS — Z4781 Encounter for orthopedic aftercare following surgical amputation: Secondary | ICD-10-CM | POA: Diagnosis not present

## 2019-07-02 DIAGNOSIS — E859 Amyloidosis, unspecified: Secondary | ICD-10-CM | POA: Diagnosis not present

## 2019-07-02 DIAGNOSIS — I2510 Atherosclerotic heart disease of native coronary artery without angina pectoris: Secondary | ICD-10-CM | POA: Diagnosis not present

## 2019-07-02 DIAGNOSIS — I70221 Atherosclerosis of native arteries of extremities with rest pain, right leg: Secondary | ICD-10-CM | POA: Diagnosis not present

## 2019-07-02 DIAGNOSIS — I132 Hypertensive heart and chronic kidney disease with heart failure and with stage 5 chronic kidney disease, or end stage renal disease: Secondary | ICD-10-CM | POA: Diagnosis not present

## 2019-07-03 DIAGNOSIS — D509 Iron deficiency anemia, unspecified: Secondary | ICD-10-CM | POA: Diagnosis not present

## 2019-07-03 DIAGNOSIS — N2581 Secondary hyperparathyroidism of renal origin: Secondary | ICD-10-CM | POA: Diagnosis not present

## 2019-07-03 DIAGNOSIS — N186 End stage renal disease: Secondary | ICD-10-CM | POA: Diagnosis not present

## 2019-07-03 DIAGNOSIS — D631 Anemia in chronic kidney disease: Secondary | ICD-10-CM | POA: Diagnosis not present

## 2019-07-03 DIAGNOSIS — D688 Other specified coagulation defects: Secondary | ICD-10-CM | POA: Diagnosis not present

## 2019-07-03 DIAGNOSIS — Z992 Dependence on renal dialysis: Secondary | ICD-10-CM | POA: Diagnosis not present

## 2019-07-04 DIAGNOSIS — I350 Nonrheumatic aortic (valve) stenosis: Secondary | ICD-10-CM | POA: Diagnosis not present

## 2019-07-04 DIAGNOSIS — I70221 Atherosclerosis of native arteries of extremities with rest pain, right leg: Secondary | ICD-10-CM | POA: Diagnosis not present

## 2019-07-04 DIAGNOSIS — I132 Hypertensive heart and chronic kidney disease with heart failure and with stage 5 chronic kidney disease, or end stage renal disease: Secondary | ICD-10-CM | POA: Diagnosis not present

## 2019-07-04 DIAGNOSIS — I255 Ischemic cardiomyopathy: Secondary | ICD-10-CM | POA: Diagnosis not present

## 2019-07-04 DIAGNOSIS — M5136 Other intervertebral disc degeneration, lumbar region: Secondary | ICD-10-CM | POA: Diagnosis not present

## 2019-07-04 DIAGNOSIS — Z4781 Encounter for orthopedic aftercare following surgical amputation: Secondary | ICD-10-CM | POA: Diagnosis not present

## 2019-07-04 DIAGNOSIS — N186 End stage renal disease: Secondary | ICD-10-CM | POA: Diagnosis not present

## 2019-07-04 DIAGNOSIS — I2510 Atherosclerotic heart disease of native coronary artery without angina pectoris: Secondary | ICD-10-CM | POA: Diagnosis not present

## 2019-07-04 DIAGNOSIS — I951 Orthostatic hypotension: Secondary | ICD-10-CM | POA: Diagnosis not present

## 2019-07-04 DIAGNOSIS — N2581 Secondary hyperparathyroidism of renal origin: Secondary | ICD-10-CM | POA: Diagnosis not present

## 2019-07-04 DIAGNOSIS — D631 Anemia in chronic kidney disease: Secondary | ICD-10-CM | POA: Diagnosis not present

## 2019-07-04 DIAGNOSIS — I5042 Chronic combined systolic (congestive) and diastolic (congestive) heart failure: Secondary | ICD-10-CM | POA: Diagnosis not present

## 2019-07-04 DIAGNOSIS — E859 Amyloidosis, unspecified: Secondary | ICD-10-CM | POA: Diagnosis not present

## 2019-07-06 DIAGNOSIS — N186 End stage renal disease: Secondary | ICD-10-CM | POA: Diagnosis not present

## 2019-07-06 DIAGNOSIS — D631 Anemia in chronic kidney disease: Secondary | ICD-10-CM | POA: Diagnosis not present

## 2019-07-06 DIAGNOSIS — D509 Iron deficiency anemia, unspecified: Secondary | ICD-10-CM | POA: Diagnosis not present

## 2019-07-06 DIAGNOSIS — D688 Other specified coagulation defects: Secondary | ICD-10-CM | POA: Diagnosis not present

## 2019-07-06 DIAGNOSIS — Z992 Dependence on renal dialysis: Secondary | ICD-10-CM | POA: Diagnosis not present

## 2019-07-06 DIAGNOSIS — N2581 Secondary hyperparathyroidism of renal origin: Secondary | ICD-10-CM | POA: Diagnosis not present

## 2019-07-07 DIAGNOSIS — I5042 Chronic combined systolic (congestive) and diastolic (congestive) heart failure: Secondary | ICD-10-CM | POA: Diagnosis not present

## 2019-07-07 DIAGNOSIS — M5136 Other intervertebral disc degeneration, lumbar region: Secondary | ICD-10-CM | POA: Diagnosis not present

## 2019-07-07 DIAGNOSIS — D631 Anemia in chronic kidney disease: Secondary | ICD-10-CM | POA: Diagnosis not present

## 2019-07-07 DIAGNOSIS — I2510 Atherosclerotic heart disease of native coronary artery without angina pectoris: Secondary | ICD-10-CM | POA: Diagnosis not present

## 2019-07-07 DIAGNOSIS — I132 Hypertensive heart and chronic kidney disease with heart failure and with stage 5 chronic kidney disease, or end stage renal disease: Secondary | ICD-10-CM | POA: Diagnosis not present

## 2019-07-07 DIAGNOSIS — N186 End stage renal disease: Secondary | ICD-10-CM | POA: Diagnosis not present

## 2019-07-07 DIAGNOSIS — E859 Amyloidosis, unspecified: Secondary | ICD-10-CM | POA: Diagnosis not present

## 2019-07-07 DIAGNOSIS — I70221 Atherosclerosis of native arteries of extremities with rest pain, right leg: Secondary | ICD-10-CM | POA: Diagnosis not present

## 2019-07-07 DIAGNOSIS — I951 Orthostatic hypotension: Secondary | ICD-10-CM | POA: Diagnosis not present

## 2019-07-07 DIAGNOSIS — N2581 Secondary hyperparathyroidism of renal origin: Secondary | ICD-10-CM | POA: Diagnosis not present

## 2019-07-07 DIAGNOSIS — I255 Ischemic cardiomyopathy: Secondary | ICD-10-CM | POA: Diagnosis not present

## 2019-07-07 DIAGNOSIS — Z4781 Encounter for orthopedic aftercare following surgical amputation: Secondary | ICD-10-CM | POA: Diagnosis not present

## 2019-07-07 DIAGNOSIS — I350 Nonrheumatic aortic (valve) stenosis: Secondary | ICD-10-CM | POA: Diagnosis not present

## 2019-07-08 DIAGNOSIS — Z992 Dependence on renal dialysis: Secondary | ICD-10-CM | POA: Diagnosis not present

## 2019-07-08 DIAGNOSIS — D688 Other specified coagulation defects: Secondary | ICD-10-CM | POA: Diagnosis not present

## 2019-07-08 DIAGNOSIS — N2581 Secondary hyperparathyroidism of renal origin: Secondary | ICD-10-CM | POA: Diagnosis not present

## 2019-07-08 DIAGNOSIS — D509 Iron deficiency anemia, unspecified: Secondary | ICD-10-CM | POA: Diagnosis not present

## 2019-07-08 DIAGNOSIS — D631 Anemia in chronic kidney disease: Secondary | ICD-10-CM | POA: Diagnosis not present

## 2019-07-08 DIAGNOSIS — N186 End stage renal disease: Secondary | ICD-10-CM | POA: Diagnosis not present

## 2019-07-09 ENCOUNTER — Telehealth: Payer: Self-pay

## 2019-07-09 ENCOUNTER — Other Ambulatory Visit: Payer: Self-pay

## 2019-07-09 ENCOUNTER — Encounter: Payer: Medicare Other | Admitting: Physical Medicine & Rehabilitation

## 2019-07-09 ENCOUNTER — Encounter: Payer: Self-pay | Admitting: Physical Medicine & Rehabilitation

## 2019-07-09 VITALS — BP 183/79 | HR 71 | Temp 97.7°F | Ht 70.0 in | Wt 152.0 lb

## 2019-07-09 DIAGNOSIS — I70221 Atherosclerosis of native arteries of extremities with rest pain, right leg: Secondary | ICD-10-CM | POA: Diagnosis not present

## 2019-07-09 DIAGNOSIS — N186 End stage renal disease: Secondary | ICD-10-CM

## 2019-07-09 DIAGNOSIS — I350 Nonrheumatic aortic (valve) stenosis: Secondary | ICD-10-CM | POA: Diagnosis not present

## 2019-07-09 DIAGNOSIS — R7881 Bacteremia: Secondary | ICD-10-CM | POA: Diagnosis not present

## 2019-07-09 DIAGNOSIS — N2581 Secondary hyperparathyroidism of renal origin: Secondary | ICD-10-CM | POA: Diagnosis not present

## 2019-07-09 DIAGNOSIS — I5042 Chronic combined systolic (congestive) and diastolic (congestive) heart failure: Secondary | ICD-10-CM | POA: Diagnosis not present

## 2019-07-09 DIAGNOSIS — M62830 Muscle spasm of back: Secondary | ICD-10-CM | POA: Diagnosis not present

## 2019-07-09 DIAGNOSIS — I132 Hypertensive heart and chronic kidney disease with heart failure and with stage 5 chronic kidney disease, or end stage renal disease: Secondary | ICD-10-CM | POA: Diagnosis not present

## 2019-07-09 DIAGNOSIS — M5136 Other intervertebral disc degeneration, lumbar region: Secondary | ICD-10-CM | POA: Diagnosis not present

## 2019-07-09 DIAGNOSIS — E859 Amyloidosis, unspecified: Secondary | ICD-10-CM | POA: Diagnosis not present

## 2019-07-09 DIAGNOSIS — R269 Unspecified abnormalities of gait and mobility: Secondary | ICD-10-CM | POA: Diagnosis not present

## 2019-07-09 DIAGNOSIS — Z992 Dependence on renal dialysis: Secondary | ICD-10-CM | POA: Diagnosis not present

## 2019-07-09 DIAGNOSIS — I2510 Atherosclerotic heart disease of native coronary artery without angina pectoris: Secondary | ICD-10-CM | POA: Diagnosis not present

## 2019-07-09 DIAGNOSIS — Z89511 Acquired absence of right leg below knee: Secondary | ICD-10-CM | POA: Diagnosis not present

## 2019-07-09 DIAGNOSIS — D631 Anemia in chronic kidney disease: Secondary | ICD-10-CM | POA: Diagnosis not present

## 2019-07-09 DIAGNOSIS — I255 Ischemic cardiomyopathy: Secondary | ICD-10-CM | POA: Diagnosis not present

## 2019-07-09 DIAGNOSIS — I951 Orthostatic hypotension: Secondary | ICD-10-CM | POA: Diagnosis not present

## 2019-07-09 DIAGNOSIS — Z4781 Encounter for orthopedic aftercare following surgical amputation: Secondary | ICD-10-CM | POA: Diagnosis not present

## 2019-07-09 MED ORDER — TIZANIDINE HCL 2 MG PO CAPS: 2.0000 mg | ORAL_CAPSULE | Freq: Every day | ORAL | 1 refills | Status: DC | PRN

## 2019-07-09 NOTE — Progress Notes (Signed)
Subjective:    Patient ID: Jose Garrett, male    DOB: February 18, 1951, 69 y.o.   MRN: [id number]  HPI 69 year old right-handed male with history of remote tobacco abuse end-stage renal disease due to cardiorenal syndrome in the setting of AL amyloidosis, aortic stenosis, hypertension CHF presents for hospital follow up for debility secondary to fem-pop bypass complicated by NSTEMI.   Last clinic visit on 06/11/19 with NP, notes reviewed.  Since that time, pt has been following up with Vascular, notes reviewed - plan for LLE studies.  He saw [employer] and was given a Facilities manager.  He completed abx. Weights have been stable. He has been going to HD, without issue. BP elevated this AM, but states he did not take any medications this AM. Pt states he fell asleep in his chair and fell yesterday, no trauma to stump. Sleep remains poor. Nausea has improved.  Bowel movements have improved. He continues to have back pain, for which he takes Oxycodone.  He has phantom limb pain as well.   Pain Inventory Average Pain 6 Pain Right Now 5 My pain is intermittent and stabbing  In the last 24 hours, has pain interfered with the following? General activity 6 Relation with others 6 Enjoyment of life 7 What TIME of day is your pain at its worst? evening Sleep (in general) Poor  Pain is worse with: sitting, inactivity, standing, unsure and some activites Pain improves with: therapy/exercise and medication Relief from Meds: 7  Mobility walk with assistance use a walker ability to climb steps?  no do you drive?  no use a wheelchair  Function retired I need assistance with the following:  household duties and shopping Do you have any goals in this area?  no  Neuro/Psych tremor tingling trouble walking spasms  Prior Studies Any changes since last visit?  no  Physicians involved in your care Any changes since last visit?  no   Family History  Problem Relation Age of Onset  . Hypertension Mother     . Diabetes Mother   . Heart disease Mother   . Skin cancer Mother   . Hypertension Father   . Diabetes Father   . Diabetes Sister    Social History   Socioeconomic History  . Marital status: Divorced    Spouse name: Not on file  . Number of children: Not on file  . Years of education: Not on file  . Highest education level: Not on file  Occupational History  . Not on file  Tobacco Use  . Smoking status: Former Smoker    Packs/day: 2.00    Years: 40.00    Pack years: 80.00    Types: Cigarettes    Quit date: 03/19/2011    Years since quitting: 8.3  . Smokeless tobacco: Never Used  Substance and Sexual Activity  . Alcohol use: No    Alcohol/week: 0.0 standard drinks  . Drug use: No    Comment:  "I have used marijuana till the 1990's". Notes that he quit 15 yrs ago  . Sexual activity: Not Currently  Other Topics Concern  . 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    Social Determinants of Health   Financial Resource Strain:   . Difficulty of Paying Living Expenses:   Food Insecurity:   . Worried About  Running Out of Food in the Last Year:   . [place] in the Last Year:   Transportation Needs:   . Lack of Transportation (Medical):   [name] Lack of Transportation (Non-Medical):   Physical Activity:   . Days of Exercise per Week:   . Minutes of Exercise per Session:   Stress:   . Feeling of Stress :   Social Connections:   . Frequency of Communication with Friends and Family:   . Frequency of Social Gatherings with Friends and Family:   . Attends Religious Services:   . Active Member of Clubs or Organizations:   . Attends Archivist Meetings:   [name] Marital Status:    Past Surgical History:  Procedure Laterality Date  . ABDOMINAL AORTOGRAM W/LOWER EXTREMITY Bilateral 07/16/2018   Procedure: ABDOMINAL AORTOGRAM  W/LOWER EXTREMITY;  Surgeon: Wellington Hampshire, MD;  Location: [place] [facility];  Service: Cardiovascular;  Laterality: Bilateral;  . ABDOMINAL AORTOGRAM W/LOWER EXTREMITY N/A 04/09/2019   Procedure: ABDOMINAL AORTOGRAM W/LOWER EXTREMITY - Right Lower;  Surgeon: Waynetta Sandy, MD;  Location: [place] [facility];  Service: Cardiovascular;  Laterality: N/A;  . AMPUTATION Left 08/19/2018   Procedure: AMPUTATION LEFT TOES SECOND AND THIRD;  Surgeon: Waynetta Sandy, MD;  Location: [place];  Service: Vascular;  Laterality: Left;  . AMPUTATION Right 05/07/2019   Procedure: AMPUTATION BELOW KNEE;  Surgeon: Serafina Mitchell, MD;  [facility];  Service: Vascular;  Laterality: Right;  . AV FISTULA PLACEMENT Right 12/27/2015   Procedure: Right Arm ARTERIOVENOUS (AV) FISTULA CREATION;  Surgeon: Angelia Mould, MD;  Location: [place];  Service: Vascular;  Laterality: Right;  . CATARACT EXTRACTION W/ INTRAOCULAR LENS  IMPLANT, BILATERAL    . COLONOSCOPY    . ERCP W/ SPHICTEROTOMY    . FEMORAL-POPLITEAL BYPASS GRAFT Left 07/22/2018   Procedure: left FEMORAL TO BELOW KNEE POPLITEAL ARTERY BYPASS GREAFT using 3mm removable ring propaten gore graft;  Surgeon: Waynetta Sandy, MD;  Location: [place];  Service: Vascular;  Laterality: Left;  . FEMORAL-POPLITEAL BYPASS GRAFT Right 04/10/2019   Procedure: BYPASS GRAFT RIGHT FEMORAL-POPLITEAL ARTERY;  Surgeon: Rosetta Posner, MD;  Location: [place];  Service: Vascular;  Laterality: Right;  . I & D EXTREMITY Right 05/05/2019   Procedure: Revision of  RIGHT  FOOT Amputation;  Surgeon: Waynetta Sandy, MD;  Location: [place];  Service: Vascular;  Laterality: Right;  . INTRAOPERATIVE TRANSTHORACIC ECHOCARDIOGRAM N/A 10/21/2018   Procedure: Intraoperative Transthoracic Echocardiogram;  Surgeon: Sherren Mocha, MD;  Location: [place];  Service: Open Heart Surgery;  Laterality: N/A;  . IR GENERIC HISTORICAL  01/27/2016   IR FLUORO GUIDE CV  LINE RIGHT 01/27/2016 Arne Cleveland, MD [facility]-INTER[facility]  . IR GENERIC HISTORICAL  01/27/2016   IR US GUIDE VASC ACCESS RIGHT 01/27/2016 Arne Cleveland, MD [facility]INTER[facility]  . LAPAROSCOPIC CHOLECYSTECTOMY  03/2011  . LEFT HEART CATH AND CORONARY ANGIOGRAPHY N/A 11/25/2017   Procedure: LEFT HEART CATH AND CORONARY ANGIOGRAPHY;  Surgeon: Martinique, Peter M, MD;  Location: [place] CV LAB;  Service: Cardiovascular;  Laterality: N/A;  . LEFT HEART CATH AND CORONARY ANGIOGRAPHY N/A 07/24/2018   Procedure: LEFT HEART CATH AND CORONARY ANGIOGRAPHY;  Surgeon: Sherren Mocha, MD;  Location: [place] [facility];  Service: Cardiovascular;  Laterality: N/A;  . RENAL BIOPSY, PERCUTANEOUS Right 09/08/2014  . RIGHT/LEFT HEART CATH AND CORONARY ANGIOGRAPHY N/A 03/25/2018   Procedure: RIGHT/LEFT HEART CATH AND CORONARY ANGIOGRAPHY;  Surgeon:  Martinique, Peter M, MD;  Location: [place] [facility];  Service: Cardiovascular;  Laterality: N/A;  . TEE WITHOUT CARDIOVERSION N/A 05/14/2019   Procedure: TRANSESOPHAGEAL ECHOCARDIOGRAM (TEE);  Surgeon: Fay Records, MD;  Location[facility] ENDOSCOPY;  Service: Cardiovascular;  Laterality: N/A;  . TOE AMPUTATION Left 08/19/2018   2nd & 3rd toes  . TONSILLECTOMY  ~ 1960  . TRANSCATHETER AORTIC VALVE REPLACEMENT, TRANSFEMORAL N/A 10/21/2018   Procedure: TRANSCATHETER AORTIC VALVE REPLACEMENT, TRANSFEMORAL;  Surgeon: Sherren Mocha, MD;  Location: [place];  Service: Open Heart Surgery;  Laterality: N/A;  . TRANSMETATARSAL AMPUTATION Right 04/29/2019   Procedure: RIGHT TRANSMETATARSAL AMPUTATION;  Surgeon: Waynetta Sandy, MD;  Location: [place];  Service: Vascular;  Laterality: Right;  . ULTRASOUND GUIDANCE FOR VASCULAR ACCESS  03/25/2018   Procedure: Ultrasound Guidance For Vascular Access;  Surgeon: Martinique, Peter M, MD;  Location: [place] CV LAB;  Service: Cardiovascular;;   Past Medical History:  Diagnosis Date  . Amyloidosis ([place])   . Anemia of chronic disease   . Anxiety   .  CAD (coronary artery disease)    a. cardiac cath on 11/25/17 with occlusion of the RCA which could have been his event but the RCA could not be engaged. The RCA filled distally from left to right collaterals. There were no severe stenoses in the left coronary system. Medical therapy recommended, started on plavix.  . Chronic combined systolic and diastolic CHF (congestive heart failure) ([place])    a. EF dropped to 35-40% with grade 2 DD by echo 11/2017.  [name] DJD (degenerative joint disease), lumbar   . ESRD (end stage renal disease) ([place])    ESRD- HEMO MWF- [facility]  . GERD (gastroesophageal reflux disease)   . Hyperlipidemia   . Hypertension   . Myocardial infarction ([place])   . Neuropathy   . Peripheral vascular disease ([place])   . Pneumonia   . Restless legs   . S/P TAVR (transcatheter aortic valve replacement) 10/21/2018   26 mm Edwards Sapien 3 Ultra transcatheter heart valve placed via percutaneous right transfemoral approach   . Severe aortic stenosis    severe   BP (!) 183/79   Pulse 71   Temp 97.7 F (36.5 C)   Ht 5\' 10"  (1.778 m)   Wt 152 lb (68.9 kg)   SpO2 95%   BMI 21.81 kg/m   Opioid Risk Score:   Fall Risk Score:  `1  Depression screen PHQ 2/9  Depression screen [facility] 2/9 06/11/2019 04/22/2019 10/09/2018 09/11/2018 08/21/2018  Decreased Interest 0 0 0 1 1  Down, Depressed, Hopeless 0 1 0 1 1  PHQ - 2 Score 0 1 0 2 2  Altered sleeping 0 - 3 0 2  Tired, decreased energy 3 - 2 1 2   Change in appetite 0 - 0 0 0  Feeling bad or failure about yourself  0 - 0 0 1  Trouble concentrating 0 - 0 0 1  Moving slowly or fidgety/restless 0 - 0 1 1  Suicidal thoughts 0 - 0 0 0  PHQ-9 Score 3 - 5 4 9   Difficult doing work/chores - - Somewhat difficult Somewhat difficult Somewhat difficult  Some recent data might be hidden    Review of Systems  Constitutional: Negative.   HENT: Negative.   Eyes: Negative.   Respiratory: Negative.   Cardiovascular: Negative.   Gastrointestinal:  Negative.   Endocrine: Negative.   Genitourinary: Negative.   Musculoskeletal: Positive for back pain and gait problem.  Spasms  Skin: Negative.   Allergic/Immunologic: Negative.   Neurological: Positive for tremors.       Tingling  Hematological: Negative.   Psychiatric/Behavioral: Negative.        Objective:   Physical Exam Constitutional: NAD. Vital signs reviewed. HENT: Normocephalic. Atraumatic.  Musculoskeletal: No edema or tenderness in extremities LLE 2nd, 3rd digit amputation Neurological:  Alert Follows commands.  Motor: Right lower extremity: 4+/5 proximal distal, stable Left lower extremity: Hip flexion, knee extension 4-4+/5, ankle dorsiflexion 4+/5 Skin:  Right BKA healing with central scabbing    Assessment & Plan:  Right-handed male with history of remote tobacco abuse end-stage renal disease due to cardiorenal syndrome in the setting of AL amyloidosis, aortic stenosis, hypertension CHF presents for follow up for debility secondary to fem-pop bypass complicated by NSTEMI.   1.  Debility secondary to PAD status post left common femoral to below-knee popliteal artery bypass 6/73/4193 complicated by non-STEMI with subsequent LLE 2nd, 3rd digit amputation  Cont HEP  Cont follow up with Vasc - plans for ?vein replacement LLE, workup planned in the next week  Cont shrinker  Follow up with prosthesis for molding  2. Peripheral Neuropathy:   States no benefit with Gabapentin  Recommended trial lidocaine patch again   Oxycodone as needed per ?PCP  3.  Aortic stenosis.   S/p valve replacement  Cont follow up with CTS  4.  End-stage renal disease.    Continues to have sig edema in LE  Recs per Nephro   5.  Chronic combined diastolic systolic congestive heart failure with EF of 35-40%   Cont monitor weights  Stable per pt  6. Consitpation  Improved  7.  Severe CAD  Continue up with Cards  8. Gait abnormality  Cont wheelchair for safety, will plan  for therapies after prosthesis  See #1  10. Back spasms  Positional with prolonged isitting  Will order Tizanidine 2mg  daily PRN, Robaxin not covered by insurance  Will consider trigger point injections

## 2019-07-10 ENCOUNTER — Telehealth: Payer: Self-pay | Admitting: Family Medicine

## 2019-07-10 ENCOUNTER — Ambulatory Visit (INDEPENDENT_AMBULATORY_CARE_PROVIDER_SITE_OTHER)
Admission: RE | Admit: 2019-07-10 | Discharge: 2019-07-10 | Disposition: A | Discharge status: Home / Self Care | Payer: Medicare Other | Source: Ambulatory Visit | Attending: Vascular Surgery | Admitting: Vascular Surgery

## 2019-07-10 ENCOUNTER — Ambulatory Visit (HOSPITAL_COMMUNITY)
Admission: RE | Admit: 2019-07-10 | Discharge: 2019-07-10 | Disposition: A | Discharge status: Home / Self Care | Payer: Medicare Other | Source: Ambulatory Visit | Attending: Vascular Surgery | Admitting: Vascular Surgery

## 2019-07-10 ENCOUNTER — Ambulatory Visit (INDEPENDENT_AMBULATORY_CARE_PROVIDER_SITE_OTHER): Payer: Self-pay | Admitting: Physician Assistant

## 2019-07-10 VITALS — BP 189/75 | HR 68 | Temp 98.0°F | Resp 16 | Ht 70.0 in | Wt 156.2 lb

## 2019-07-10 DIAGNOSIS — I739 Peripheral vascular disease, unspecified: Secondary | ICD-10-CM | POA: Insufficient documentation

## 2019-07-10 DIAGNOSIS — I779 Disorder of arteries and arterioles, unspecified: Secondary | ICD-10-CM

## 2019-07-10 DIAGNOSIS — Z992 Dependence on renal dialysis: Secondary | ICD-10-CM | POA: Diagnosis not present

## 2019-07-10 DIAGNOSIS — N186 End stage renal disease: Secondary | ICD-10-CM

## 2019-07-10 DIAGNOSIS — N2581 Secondary hyperparathyroidism of renal origin: Secondary | ICD-10-CM | POA: Diagnosis not present

## 2019-07-10 DIAGNOSIS — D631 Anemia in chronic kidney disease: Secondary | ICD-10-CM | POA: Diagnosis not present

## 2019-07-10 DIAGNOSIS — D688 Other specified coagulation defects: Secondary | ICD-10-CM | POA: Diagnosis not present

## 2019-07-10 DIAGNOSIS — Z95828 Presence of other vascular implants and grafts: Secondary | ICD-10-CM

## 2019-07-10 DIAGNOSIS — I129 Hypertensive chronic kidney disease with stage 1 through stage 4 chronic kidney disease, or unspecified chronic kidney disease: Secondary | ICD-10-CM | POA: Diagnosis not present

## 2019-07-10 DIAGNOSIS — D509 Iron deficiency anemia, unspecified: Secondary | ICD-10-CM | POA: Diagnosis not present

## 2019-07-10 NOTE — Progress Notes (Signed)
History of Present Illness:  Patient is a 69 y.o. year old male who presents for evaluation of left LE bypass with third toe amputation 07/22/18 by Dr. Donzetta Matters and right BKA 05/07/19 by Dr. Trula Slade.  He also has a right UE AV fistula.   His incisions have all healed well and he is here today for f/u ABI and arterial bypass duplex on the left LE.  He denise non healing wounds, fever or chills. He is presently in a shrinker for his right BKA. He has a scab present on right BKA incision with some tenderness on the bottom of BKA stump but denies any pain unless area is compressed or trauma to area. He does report several falls but he has not had any major injuries from this. He denies any left lower extremity rest pain, claudication symptoms or non healing wounds. He has been going to therapy 2x per week but is not ambulating much.  His right UE AV fistula is working well. He denies any pain, swelling, bleeding or difficulty at dialysis. He denies any right upper extremity steal symptoms.  He is on Plavix, Asa and a statin daily.    Past Medical History:  Diagnosis Date  . Amyloidosis ([place])   . Anemia of chronic disease   . Anxiety   . CAD (coronary artery disease)    a. cardiac cath on 11/25/17 with occlusion of the RCA which could have been his event but the RCA could not be engaged. The RCA filled distally from left to right collaterals. There were no severe stenoses in the left coronary system. Medical therapy recommended, started on plavix.  . Chronic combined systolic and diastolic CHF (congestive heart failure) ([place])    a. EF dropped to 35-40% with grade 2 DD by echo 11/2017.  [name] DJD (degenerative joint disease), lumbar   . ESRD (end stage renal disease) ([place])    ESRD- HEMO MWF- [facility]  . GERD (gastroesophageal reflux disease)   . Hyperlipidemia   . Hypertension   . Myocardial infarction ([place])   . Neuropathy   . Peripheral vascular disease ([place])   . Pneumonia   . Restless legs    . S/P TAVR (transcatheter aortic valve replacement) 10/21/2018   26 mm Edwards Sapien 3 Ultra transcatheter heart valve placed via percutaneous right transfemoral approach   . Severe aortic stenosis    severe    Past Surgical History:  Procedure Laterality Date  . ABDOMINAL AORTOGRAM W/LOWER EXTREMITY Bilateral 07/16/2018   Procedure: ABDOMINAL AORTOGRAM W/LOWER EXTREMITY;  Surgeon: Wellington Hampshire, MD;  Location: [place] [facility];  Service: Cardiovascular;  Laterality: Bilateral;  . ABDOMINAL AORTOGRAM W/LOWER EXTREMITY N/A 04/09/2019   Procedure: ABDOMINAL AORTOGRAM W/LOWER EXTREMITY - Right Lower;  Surgeon: Waynetta Sandy, MD;  Location: [place] [facility];  Service: Cardiovascular;  Laterality: N/A;  . AMPUTATION Left 08/19/2018   Procedure: AMPUTATION LEFT TOES SECOND AND THIRD;  Surgeon: Waynetta Sandy, MD;  Location: [place];  Service: Vascular;  Laterality: Left;  . AMPUTATION Right 05/07/2019   Procedure: AMPUTATION BELOW KNEE;  Surgeon: Serafina Mitchell, MD;  Location: [facility] OR;  Service: Vascular;  Laterality: Right;  . AV FISTULA PLACEMENT Right 12/27/2015   Procedure: Right Arm ARTERIOVENOUS (AV) FISTULA CREATION;  Surgeon: Angelia Mould, MD;  Location: [place];  Service: Vascular;  Laterality: Right;  . CATARACT EXTRACTION W/ INTRAOCULAR LENS  IMPLANT, BILATERAL    . COLONOSCOPY    . ERCP  W/ SPHICTEROTOMY    . FEMORAL-POPLITEAL BYPASS GRAFT Left 07/22/2018   Procedure: left FEMORAL TO BELOW KNEE POPLITEAL ARTERY BYPASS GREAFT using 28mm removable ring propaten gore graft;  Surgeon: Waynetta Sandy, MD;  Location: [place];  Service: Vascular;  Laterality: Left;  . FEMORAL-POPLITEAL BYPASS GRAFT Right 04/10/2019   Procedure: BYPASS GRAFT RIGHT FEMORAL-POPLITEAL ARTERY;  Surgeon: Rosetta Posner, MD;  Location: [place];  Service: Vascular;  Laterality: Right;  . I & D EXTREMITY Right 05/05/2019   Procedure: Revision of  RIGHT  FOOT Amputation;  Surgeon:  Waynetta Sandy, MD;  Location: [place];  Service: Vascular;  Laterality: Right;  . INTRAOPERATIVE TRANSTHORACIC ECHOCARDIOGRAM N/A 10/21/2018   Procedure: Intraoperative Transthoracic Echocardiogram;  Surgeon: Sherren Mocha, MD;  Location: [place];  Service: Open Heart Surgery;  Laterality: N/A;  . IR GENERIC HISTORICAL  01/27/2016   IR FLUORO GUIDE CV LINE RIGHT 01/27/2016 Arne Cleveland, MD [facility]  . IR GENERIC HISTORICAL  01/27/2016   IR US GUIDE VASC ACCESS RIGHT 01/27/2016 Arne Cleveland, MD [facility]  . LAPAROSCOPIC CHOLECYSTECTOMY  03/2011  . LEFT HEART CATH AND CORONARY ANGIOGRAPHY N/A 11/25/2017   Procedure: LEFT HEART CATH AND CORONARY ANGIOGRAPHY;  Surgeon: Martinique, Peter M, MD;  Location: [place] [facility];  Service: Cardiovascular;  Laterality: N/A;  . LEFT HEART CATH AND CORONARY ANGIOGRAPHY N/A 07/24/2018   Procedure: LEFT HEART CATH AND CORONARY ANGIOGRAPHY;  Surgeon: Sherren Mocha, MD;  Location: [place] [facility];  Service: Cardiovascular;  Laterality: N/A;  . RENAL BIOPSY, PERCUTANEOUS Right 09/08/2014  . RIGHT/LEFT HEART CATH AND CORONARY ANGIOGRAPHY N/A 03/25/2018   Procedure: RIGHT/LEFT HEART CATH AND CORONARY ANGIOGRAPHY;  Surgeon: Martinique, Peter M, MD;  Location: [place] [facility];  Service: Cardiovascular;  Laterality: N/A;  . TEE WITHOUT CARDIOVERSION N/A 05/14/2019   Procedure: TRANSESOPHAGEAL ECHOCARDIOGRAM (TEE);  Surgeon: Fay Records, MD;  Location[facility];  Service: Cardiovascular;  Laterality: N/A;  . TOE AMPUTATION Left 08/19/2018   2nd & 3rd toes  . TONSILLECTOMY  ~ 1960  . TRANSCATHETER AORTIC VALVE REPLACEMENT, TRANSFEMORAL N/A 10/21/2018   Procedure: TRANSCATHETER AORTIC VALVE REPLACEMENT, TRANSFEMORAL;  Surgeon: Sherren Mocha, MD;  Location: [place];  Service: Open Heart Surgery;  Laterality: N/A;  . TRANSMETATARSAL AMPUTATION Right 04/29/2019   Procedure: RIGHT TRANSMETATARSAL AMPUTATION;  Surgeon: Waynetta Sandy, MD;   Location: [place];  Service: Vascular;  Laterality: Right;  . ULTRASOUND GUIDANCE FOR VASCULAR ACCESS  03/25/2018   Procedure: Ultrasound Guidance For Vascular Access;  Surgeon: Martinique, Peter M, MD;  Location: [place] [facility];  Service: Cardiovascular;;    ROS:   General:  No weight loss, Fever, chills  HEENT: No recent headaches, no nasal bleeding, no visual changes, no sore throat  Neurologic: No dizziness, blackouts, seizures. No recent symptoms of stroke or mini- stroke. No recent episodes of slurred speech, or temporary blindness.  Cardiac: No recent episodes of chest pain/pressure, no shortness of breath at rest.  No shortness of breath with exertion.  Denies history of atrial fibrillation or irregular heartbeat  Vascular: No history of rest pain in feet.  No history of claudication.  No history of non-healing ulcer, No history of DVT   Pulmonary: No home oxygen, no productive cough, no hemoptysis,  No asthma or wheezing  Musculoskeletal:  [ ]  Arthritis, [ ]  Low back pain,  [ ]  Joint pain  Hematologic:No history of hypercoagulable state.  No history of easy bleeding.  No history  of anemia  Gastrointestinal: No hematochezia or melena,  No gastroesophageal reflux, no trouble swallowing  Urinary: [ X] chronic Kidney disease, Valu.Nieves ] on HD - Valu.Nieves ] MWF or [ ]  TTHS, [ ]  Burning with urination, [ ]  Frequent urination, [ ]  Difficulty urinating;   Skin: No rashes  Psychological: No history of anxiety,  No history of depression  Social History Social History   Tobacco Use  . Smoking status: Former Smoker    Packs/day: 2.00    Years: 40.00    Pack years: 80.00    Types: Cigarettes    Quit date: 03/19/2011    Years since quitting: 8.3  . Smokeless tobacco: Never Used  Substance Use Topics  . Alcohol use: No    Alcohol/week: 0.0 standard drinks  . Drug use: No    Comment:  "I have used marijuana till the 1990's". Notes that he quit 15 yrs ago    Family History Family History   Problem Relation Age of Onset  . Hypertension Mother   . Diabetes Mother   . Heart disease Mother   . Skin cancer Mother   . Hypertension Father   . Diabetes Father   . Diabetes Sister     Allergies  No Known Allergies   Current Outpatient Medications  Medication Sig Dispense Refill  . acetaminophen (TYLENOL) 325 MG tablet Take 1-2 tablets (325-650 mg total) by mouth every 4 (four) hours as needed for mild pain (or temp >/= 101 F).    [name] amiodarone (PACERONE) 200 MG tablet Take 1 tablet (200 mg total) by mouth daily. 90 tablet 2  . atorvastatin (LIPITOR) 80 MG tablet Take 1 tablet (80 mg total) by mouth daily with supper. 30 tablet 0  . calcitRIOL (ROCALTROL) 0.5 MCG capsule Take 3 capsules (1.5 mcg total) by mouth every Monday, Wednesday, and Friday with hemodialysis. 30 capsule 0  . clopidogrel (PLAVIX) 75 MG tablet Take 1 tablet (75 mg total) by mouth daily with supper. 90 tablet 2  . Darbepoetin Alfa (ARANESP) 100 MCG/0.5ML SOSY injection Inject 0.5 mLs (100 mcg total) into the vein every Friday with hemodialysis. 4.2 mL   . docusate sodium (COLACE) 100 MG capsule Take 1 capsule (100 mg total) by mouth 2 (two) times daily. 10 capsule 0  . LORazepam (ATIVAN) 1 MG tablet Take 1 tablet (1 mg total) by mouth 2 (two) times daily as needed for anxiety. 20 tablet 0  . Melatonin 3 MG TABS Take 0.5 tablets (1.5 mg total) by mouth at bedtime. 30 tablet 0  . midodrine (PROAMATINE) 10 MG tablet Take 1 tablet (10 mg total) by mouth every Monday, Wednesday, and Friday with hemodialysis. 30 tablet 0  . multivitamin (RENA-VIT) TABS tablet Take 1 tablet by mouth at bedtime. (Patient taking differently: Take 1 tablet by mouth daily with breakfast. ) 30 tablet 0  . oxyCODONE (OXY IR/ROXICODONE) 5 MG immediate release tablet Take 1-2 tablets (5-10 mg total) by mouth every 4 (four) hours as needed for moderate pain. 30 tablet 0  . pantoprazole (PROTONIX) 40 MG tablet Take 1 tablet (40 mg total) by mouth  daily. 30 tablet 0  . sucroferric oxyhydroxide (VELPHORO) 500 MG chewable tablet Chew 3 tablets (1,500 mg total) by mouth 3 (three) times daily with meals. 120 tablet 0  . tiZANidine (ZANAFLEX) 2 MG tablet Take 2 mg by mouth once. One tablet by mouth daily as needed for muscle spasms     . Vancomycin (VANCOCIN) 750-5 MG/150ML-% SOLN Inject  150 mLs (750 mg total) into the vein every Monday, Wednesday, and Friday with hemodialysis. 4000 mL 0  . vitamin B-12 (CYANOCOBALAMIN) 1000 MCG tablet Take 1 tablet (1,000 mcg total) by mouth every evening. 30 tablet 0   No current facility-administered medications for this visit.    Physical Examination  Vitals:   07/10/19 0931  BP: (!) 189/75  Pulse: 68  Resp: 16  Temp: 98 F (36.7 C)  TempSrc: Temporal  SpO2: 96%  Weight: 156 lb 3.2 oz (70.9 kg)  Height: 5\' 10"  (1.778 m)    Body mass index is 22.41 kg/m.  General:  Alert and oriented, no acute distress HEENT: Normal Neck: No bruit or JVD Pulmonary: Clear to auscultation bilaterally Cardiac: Regular Rate and Rhythm without murmur Abdomen: Soft, non-tender, non-distended, no mass, no scars Skin: No rash Extremity Pulses:  2+ radial, brachial. Right forearm AV fistula with good thrill and bruit.2+ femoral pulses bilaterally, 2+ posterior tibial pulse LLE. 3rd toe amputation site well healed. Left foot warm. Right BKA stump intact with medial dry eschar and small pinpoint area of opening medially. No drainage. No erythema. No swelling Musculoskeletal: No deformity or edema  Neurologic: Upper and lower extremity motor 5/5 and symmetric  DATA:  +-------+-----------+-----------+------------+------------+  ABI/TBIToday's ABIToday's TBIPrevious ABIPrevious TBI  +-------+-----------+-----------+------------+------------+  Right BKA                        +-------+-----------+-----------+------------+------------+  Left  [place]     0.41    [employer]      0.44      +-------+-----------+-----------+------------+------------+   Left Graft #1:  +--------------------+--------+--------+----------+--------+            PSV cm/sStenosisWaveform Comments  +--------------------+--------+--------+----------+--------+  Inflow       133       monophasic      +--------------------+--------+--------+----------+--------+  Proximal Anastomosis198       monophasic      +--------------------+--------+--------+----------+--------+  Proximal Graft   138       monophasic      +--------------------+--------+--------+----------+--------+  Mid Graft      108       monophasic      +--------------------+--------+--------+----------+--------+  Distal Graft    119       monophasic      +--------------------+--------+--------+----------+--------+  Distal Anastomosis 138       monophasic      +--------------------+--------+--------+----------+--------+  Outflow       169       monophasic      +--------------------+--------+--------+----------+--------+     Summary:  Left: Patent graft with no evidence of stenosis.    ASSESSMENT: Doing well s/p left LE bypass with third toe amputation 07/22/18 by Dr. Donzetta Matters and right BKA 05/07/19 by Dr. Trula Slade.  He also has a right UE AV fistula. Fistula functioning well without any steal signs or symptoms. eft lower extremity well perfused with patent bypass graft. Palpable PT pulse in left foot and 3rd toe amputation site remains well healed. Right BKA stump with dry eschar and small pinpoint opening without concerns of infection. He can continue to wear shrinker but have advised to not ambulate with prosthesis until fully healed.  PLAN:  - he will continue his aspirin, Plavix and statin - He will have a 12 month follow up duplex of his left fem-popliteal bypass - I have  advised him that if he develops rest pain, claudication, non healing wounds he needs to call for earlier follow up - Follow  up in 4-6 weeks for recheck of right BKA stump wounds   Sanaiyah Kirchhoff PA-C [facility]: [phone]  MD in clinic [place]

## 2019-07-10 NOTE — Telephone Encounter (Signed)
[facility], OT,  called in and requested for verbal orders for 2xs a week for 4 weeks. She stated it was okay to leave a detailed voicemail if she was not reached.

## 2019-07-13 ENCOUNTER — Other Ambulatory Visit: Payer: Self-pay | Admitting: *Deleted

## 2019-07-13 ENCOUNTER — Other Ambulatory Visit: Payer: Self-pay

## 2019-07-13 ENCOUNTER — Ambulatory Visit: Payer: Medicare Other | Attending: Family Medicine | Admitting: Family Medicine

## 2019-07-13 ENCOUNTER — Encounter: Payer: Self-pay | Admitting: Family Medicine

## 2019-07-13 VITALS — BP 161/80 | HR 75 | Temp 98.4°F | Ht 70.0 in

## 2019-07-13 DIAGNOSIS — D631 Anemia in chronic kidney disease: Secondary | ICD-10-CM | POA: Diagnosis not present

## 2019-07-13 DIAGNOSIS — D688 Other specified coagulation defects: Secondary | ICD-10-CM | POA: Diagnosis not present

## 2019-07-13 DIAGNOSIS — M79645 Pain in left finger(s): Secondary | ICD-10-CM

## 2019-07-13 DIAGNOSIS — Z95828 Presence of other vascular implants and grafts: Secondary | ICD-10-CM

## 2019-07-13 DIAGNOSIS — Z992 Dependence on renal dialysis: Secondary | ICD-10-CM | POA: Diagnosis not present

## 2019-07-13 DIAGNOSIS — I739 Peripheral vascular disease, unspecified: Secondary | ICD-10-CM | POA: Diagnosis not present

## 2019-07-13 DIAGNOSIS — D509 Iron deficiency anemia, unspecified: Secondary | ICD-10-CM | POA: Diagnosis not present

## 2019-07-13 DIAGNOSIS — L989 Disorder of the skin and subcutaneous tissue, unspecified: Secondary | ICD-10-CM | POA: Diagnosis not present

## 2019-07-13 DIAGNOSIS — I129 Hypertensive chronic kidney disease with stage 1 through stage 4 chronic kidney disease, or unspecified chronic kidney disease: Secondary | ICD-10-CM | POA: Diagnosis not present

## 2019-07-13 DIAGNOSIS — N2581 Secondary hyperparathyroidism of renal origin: Secondary | ICD-10-CM | POA: Diagnosis not present

## 2019-07-13 DIAGNOSIS — N186 End stage renal disease: Secondary | ICD-10-CM | POA: Diagnosis not present

## 2019-07-13 MED ORDER — SILVER SULFADIAZINE 1 % EX CREA: 1.0000 "application " | TOPICAL_CREAM | Freq: Every day | CUTANEOUS | 0 refills | Status: DC

## 2019-07-13 NOTE — Progress Notes (Signed)
Established Patient Office Visit  Subjective:  Patient ID: Jose Garrett, male    DOB: [birth year]  Age: 69 y.o. MRN: [id number]  CC: No chief complaint on file.   HPI Jose Garrett, 69 yo medically complex patient, who is status post hospitalization for right BKA due to PAD on 05/07/2019 followed by inpatient rehabilitation from 05/18/2019-05/28/2019.  He reports that last week he was attempting to transfer from his wheelchair to another chair and lost his balance falling forward and to the left.  He believes that he may have hit his left hand on something as he was falling.  He continues to have some discomfort in his left middle finger and patient was keeping the area wrapped and noticed a raised discolored area on the tip of his finger.  He also had an abrasion on the left middle finger and he reports that this is healing very slowly.  He has been using some antibiotic ointment on the area at times.  He wondered if he possibly got a splinter into the abnormal area on the left middle finger during his fall.  He reports that the actual pain in his finger has decreased.  He is able to bend his left middle finger and hand normally.        He has had recent increase in blood pressure.  He states that earlier today at dialysis his blood pressure was also elevated.  In the past, he was on medication to actually help raise his blood pressure as well as carvedilol.  He is currently not on either medication.  He denies headaches or dizziness related to his blood pressure.  He continues to follow-up with vascular surgery status post his recent right BKA due to peripheral vascular disease.  He denies any current issues with fever or chills.  He has had no active skin breakdown on the stump.  He reports that he has seen someone regarding a prosthetic but they are waiting for the stump to shrink in size.  Past Medical History:  Diagnosis Date  . Amyloidosis ([place])   . Anemia of chronic disease   . Anxiety     . CAD (coronary artery disease)    a. cardiac cath on 11/25/17 with occlusion of the RCA which could have been his event but the RCA could not be engaged. The RCA filled distally from left to right collaterals. There were no severe stenoses in the left coronary system. Medical therapy recommended, started on plavix.  . Chronic combined systolic and diastolic CHF (congestive heart failure) ([place])    a. EF dropped to 35-40% with grade 2 DD by echo 11/2017.  [name] DJD (degenerative joint disease), lumbar   . ESRD (end stage renal disease) ([place])    ESRD- HEMO MWF- [facility]  . GERD (gastroesophageal reflux disease)   . Hyperlipidemia   . Hypertension   . Myocardial infarction ([place])   . Neuropathy   . Peripheral vascular disease ([place])   . Pneumonia   . Restless legs   . S/P TAVR (transcatheter aortic valve replacement) 10/21/2018   26 mm Edwards Sapien 3 Ultra transcatheter heart valve placed via percutaneous right transfemoral approach   . Severe aortic stenosis    severe    Past Surgical History:  Procedure Laterality Date  . ABDOMINAL AORTOGRAM W/LOWER EXTREMITY Bilateral 07/16/2018   Procedure: ABDOMINAL AORTOGRAM W/LOWER EXTREMITY;  Surgeon: Wellington Hampshire, MD;  Location: [facility];  Service: Cardiovascular;  Laterality: Bilateral;  .  ABDOMINAL AORTOGRAM W/LOWER EXTREMITY N/A 04/09/2019   Procedure: ABDOMINAL AORTOGRAM W/LOWER EXTREMITY - Right Lower;  Surgeon: Waynetta Sandy, MD;  Location: [facility];  Service: Cardiovascular;  Laterality: N/A;  . AMPUTATION Left 08/19/2018   Procedure: AMPUTATION LEFT TOES SECOND AND THIRD;  Surgeon: Waynetta Sandy, MD;  Location: [facility];  Service: Vascular;  Laterality: Left;  . AMPUTATION Right 05/07/2019   Procedure: AMPUTATION BELOW KNEE;  Surgeon: Serafina Mitchell, MD;  Location: [facility] OR;  Service: Vascular;  Laterality: Right;  . AV FISTULA PLACEMENT Right 12/27/2015   Procedure: Right Arm ARTERIOVENOUS (AV)  FISTULA CREATION;  Surgeon: Angelia Mould, MD;  Location: [place];  Service: Vascular;  Laterality: Right;  . CATARACT EXTRACTION W/ INTRAOCULAR LENS  IMPLANT, BILATERAL    . COLONOSCOPY    . ERCP W/ SPHICTEROTOMY    . FEMORAL-POPLITEAL BYPASS GRAFT Left 07/22/2018   Procedure: left FEMORAL TO BELOW KNEE POPLITEAL ARTERY BYPASS GREAFT using 16m removable ring propaten gore graft;  Surgeon: CWaynetta Sandy MD;  [place]  Service: Vascular;  Laterality: Left;  . FEMORAL-POPLITEAL BYPASS GRAFT Right 04/10/2019   Procedure: BYPASS GRAFT RIGHT FEMORAL-POPLITEAL ARTERY;  Surgeon: ERosetta Posner MD;  [place]  Service: Vascular;  Laterality: Right;  . I & D EXTREMITY Right 05/05/2019   Procedure: Revision of  RIGHT  FOOT Amputation;  Surgeon: CWaynetta Sandy MD;  [place]  Service: Vascular;  Laterality: Right;  . INTRAOPERATIVE TRANSTHORACIC ECHOCARDIOGRAM N/A 10/21/2018   Procedure: Intraoperative Transthoracic Echocardiogram;  Surgeon: CSherren Mocha MD;  [place]  Service: Open Heart Surgery;  Laterality: N/A;  . IR GENERIC HISTORICAL  01/27/2016   IR FLUORO GUIDE CV LINE RIGHT 01/27/2016 DArne Cleveland MD [facility]  . IR GENERIC HISTORICAL  01/27/2016   IR [place] ACCESS RIGHT 01/27/2016 DArne Cleveland MD [facility]  . LAPAROSCOPIC CHOLECYSTECTOMY  03/2011  . LEFT HEART CATH AND CORONARY ANGIOGRAPHY N/A 11/25/2017   Procedure: LEFT HEART CATH AND CORONARY ANGIOGRAPHY;  Surgeon: JMartinique Peter M, MD;  [facility];  Service: Cardiovascular;  Laterality: N/A;  . LEFT HEART CATH AND CORONARY ANGIOGRAPHY N/A 07/24/2018   Procedure: LEFT HEART CATH AND CORONARY ANGIOGRAPHY;  Surgeon: CSherren Mocha MD;  [place];  Service: Cardiovascular;  Laterality: N/A;  . RENAL BIOPSY, PERCUTANEOUS Right 09/08/2014  . RIGHT/LEFT HEART CATH AND CORONARY ANGIOGRAPHY N/A 03/25/2018   Procedure: RIGHT/LEFT HEART  CATH AND CORONARY ANGIOGRAPHY;  Surgeon: JMartinique Peter M, MD;  [place];  Service: Cardiovascular;  Laterality: N/A;  . TEE WITHOUT CARDIOVERSION N/A 05/14/2019   Procedure: TRANSESOPHAGEAL ECHOCARDIOGRAM (TEE);  Surgeon: RFay Records MD;  [facility];  Service: Cardiovascular;  Laterality: N/A;  . TOE AMPUTATION Left 08/19/2018   2nd & 3rd toes  . TONSILLECTOMY  ~ 1960  . TRANSCATHETER AORTIC VALVE REPLACEMENT, TRANSFEMORAL N/A 10/21/2018   Procedure: TRANSCATHETER AORTIC VALVE REPLACEMENT, TRANSFEMORAL;  Surgeon: CSherren Mocha MD;  [place]  Service: Open Heart Surgery;  Laterality: N/A;  . TRANSMETATARSAL AMPUTATION Right 04/29/2019   Procedure: RIGHT TRANSMETATARSAL AMPUTATION;  Surgeon: CWaynetta Sandy MD;  [place]  Service: Vascular;  Laterality: Right;  . ULTRASOUND GUIDANCE FOR VASCULAR ACCESS  03/25/2018   Procedure: Ultrasound Guidance For Vascular Access;  Surgeon: JMartinique Peter M, MD;  [facility];  Service: Cardiovascular;;    Family History  Problem Relation  Age of Onset  . Hypertension Mother   . Diabetes Mother   . Heart disease Mother   . Skin cancer Mother   . Hypertension Father   . Diabetes Father   . Diabetes Sister     Social History   Socioeconomic History  . Marital status: Divorced    Spouse name: Not on file  . Number of children: Not on file  . Years of education: Not on file  . Highest education level: Not on file  Occupational History  . Not on file  Tobacco Use  . Smoking status: Former Smoker    Packs/day: 2.00    Years: 40.00    Pack years: 80.00    Types: Cigarettes    Quit date: 03/19/2011    Years since quitting: 8.3  . Smokeless tobacco: Never Used  Substance and Sexual Activity  . Alcohol use: No    Alcohol/week: 0.0 standard drinks  . Drug use: No    Comment:  "I have used marijuana till the 1990's". Notes that he quit 15 yrs ago  . Sexual activity: Not  Currently  Other Topics Concern  . 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    Social Determinants of Health   Financial Resource Strain:   . Difficulty of Paying Living Expenses:   Food Insecurity:   . Worried About Charity fundraiser in the Last Year:   . Arboriculturist in the Last Year:   Transportation Needs:   . Film/video editor (Medical):   [name] Lack of Transportation (Non-Medical):   Physical Activity:   . Days of Exercise per Week:   . Minutes of Exercise per Session:   Stress:   . Feeling of Stress :   Social Connections:   . Frequency of Communication with Friends and Family:   . Frequency of Social Gatherings with Friends and Family:   . Attends Religious Services:   . Active Member of Clubs or Organizations:   . Attends Archivist Meetings:   [name] Marital Status:   Intimate Partner Violence:   . Fear of Current or Ex-Partner:   . Emotionally Abused:   [name] Physically Abused:   . Sexually Abused:     Outpatient Medications Prior to Visit  Medication Sig Dispense Refill  . acetaminophen (TYLENOL) 325 MG tablet Take 1-2 tablets (325-650 mg total) by mouth every 4 (four) hours as needed for mild pain (or temp >/= 101 F).    [name] amiodarone (PACERONE) 200 MG tablet Take 1 tablet (200 mg total) by mouth daily. 90 tablet 2  . atorvastatin (LIPITOR) 80 MG tablet Take 1 tablet (80 mg total) by mouth daily with supper. 30 tablet 0  . calcitRIOL (ROCALTROL) 0.5 MCG capsule Take 3 capsules (1.5 mcg total) by mouth every Monday, Wednesday, and Friday with hemodialysis. 30 capsule 0  . clopidogrel (PLAVIX) 75 MG tablet Take 1 tablet (75 mg total) by mouth daily with supper. 90 tablet 2  . Darbepoetin Alfa (ARANESP) 100 MCG/0.5ML SOSY injection Inject 0.5 mLs (100 mcg total) into the vein every Friday with hemodialysis. 4.2 mL   .  docusate sodium (COLACE) 100 MG capsule Take 1 capsule (100 mg total) by mouth 2 (two) times daily. 10 capsule 0  . LORazepam (ATIVAN) 1 MG tablet Take 1  tablet (1 mg total) by mouth 2 (two) times daily as needed for anxiety. 20 tablet 0  . Melatonin 3 MG TABS Take 0.5 tablets (1.5 mg total) by mouth at bedtime. 30 tablet 0  . midodrine (PROAMATINE) 10 MG tablet Take 1 tablet (10 mg total) by mouth every Monday, Wednesday, and Friday with hemodialysis. 30 tablet 0  . multivitamin (RENA-VIT) TABS tablet Take 1 tablet by mouth at bedtime. (Patient taking differently: Take 1 tablet by mouth daily with breakfast. ) 30 tablet 0  . oxyCODONE (OXY IR/ROXICODONE) 5 MG immediate release tablet Take 1-2 tablets (5-10 mg total) by mouth every 4 (four) hours as needed for moderate pain. 30 tablet 0  . pantoprazole (PROTONIX) 40 MG tablet Take 1 tablet (40 mg total) by mouth daily. 30 tablet 0  . sucroferric oxyhydroxide (VELPHORO) 500 MG chewable tablet Chew 3 tablets (1,500 mg total) by mouth 3 (three) times daily with meals. 120 tablet 0  . tiZANidine (ZANAFLEX) 2 MG tablet Take 2 mg by mouth once. One tablet by mouth daily as needed for muscle spasms     . Vancomycin (VANCOCIN) 750-5 MG/150ML-% SOLN Inject 150 mLs (750 mg total) into the vein every Monday, Wednesday, and Friday with hemodialysis. 4000 mL 0  . vitamin B-12 (CYANOCOBALAMIN) 1000 MCG tablet Take 1 tablet (1,000 mcg total) by mouth every evening. 30 tablet 0   No facility-administered medications prior to visit.    No Known Allergies  ROS Review of Systems  Constitutional: Positive for fatigue. Negative for chills and fever.  HENT: Negative for sinus pain and sore throat.   Respiratory: Negative for cough and shortness of breath.   Cardiovascular: Negative for chest pain and palpitations.  Gastrointestinal: Negative for abdominal pain, constipation, diarrhea and nausea.  Endocrine: Negative for polydipsia, polyphagia and polyuria.    Genitourinary: Negative for dysuria and frequency.  Musculoskeletal: Positive for arthralgias and gait problem.  Skin: Positive for wound. Negative for rash.  Neurological: Negative for dizziness and headaches.  Hematological: Negative for adenopathy. Does not bruise/bleed easily.      Objective:    Physical Exam  Constitutional: He is oriented to person, place, and time. He appears well-developed and well-nourished.  Smaller framed male in no acute distress sitting in wheelchair.  He does appear to have had some recent weight loss.  He is status post right BKA and has compressive wrap to the thigh and amputation area.  He is wearing facemask as for COVID-19 office protocol.  Neck: No JVD present.  Cardiovascular: Normal rate.  Pulmonary/Chest: Effort normal and breath sounds normal.  Abdominal: Soft. There is no abdominal tenderness. There is no rebound and no guarding.  Musculoskeletal:        General: Deformity (Right BKA) and edema present. No tenderness.     Cervical back: Normal range of motion and neck supple.     Comments: Mild edema of the left middle finger in the 2 surrounding fingers.  Patient is able to bend the left finger and hand with only mild decrease in range of motion  Lymphadenopathy:    He has no cervical adenopathy.  Neurological: He is alert and oriented to person, place, and time.  Skin: Skin is warm and dry.  Patient with abraded areas on the dorsum of the left hand.  Patient with hyperpigmented macular lesion on the left temple and a few scattered raised skin lesions on the scalp.  Patient with a raised nodule on the palmar surface of the  left middle finger.  Center of nodule is darkened in color.    Psychiatric: He has a normal mood and affect. His behavior is normal.  Nursing note and vitals reviewed.   BP (!) 161/80   Pulse 75   Temp 98.4 F (36.9 C) (Temporal)   Ht '5\' 10"'$  (1.778 m)   SpO2 99%   BMI 22.41 kg/m  Wt Readings from Last 3 Encounters:   07/10/19 156 lb 3.2 oz (70.9 kg)  07/09/19 152 lb (68.9 kg)  06/26/19 143 lb (64.9 kg)     Health Maintenance Due  Topic Date Due  . Hepatitis C Screening  Never done  . COLONOSCOPY  Never done  . TETANUS/TDAP  03/13/2019  . COVID-19 Vaccine (2 - Moderna 2-dose series) 07/17/2019    Lab Results  Component Value Date   TSH 1.662 10/18/2018   Lab Results  Component Value Date   WBC 4.5 05/27/2019   HGB 8.2 (L) 05/27/2019   HCT 26.5 (L) 05/27/2019   MCV 103.5 (H) 05/27/2019   PLT 97 (L) 05/27/2019   Lab Results  Component Value Date   NA 135 05/27/2019   K 4.1 05/27/2019   CHLORIDE 93 (L) 03/14/2017   CO2 25 05/27/2019   GLUCOSE 116 (H) 05/27/2019   BUN 27 (H) 05/27/2019   CREATININE 7.39 (H) 05/27/2019   BILITOT 1.3 (H) 04/29/2019   ALKPHOS 63 04/29/2019   AST 17 04/29/2019   ALT 7 04/29/2019   PROT 6.7 04/29/2019   ALBUMIN 2.7 (L) 05/27/2019   CALCIUM 9.1 05/27/2019   ANIONGAP 15 05/27/2019   EGFR 6 (L) 03/14/2017   Lab Results  Component Value Date   CHOL 117 02/12/2018   Lab Results  Component Value Date   HDL 32 (L) 02/12/2018   Lab Results  Component Value Date   LDLCALC 50 02/12/2018   Lab Results  Component Value Date   TRIG 174 (H) 02/12/2018   Lab Results  Component Value Date   CHOLHDL 3.7 02/12/2018   Lab Results  Component Value Date   HGBA1C 5.0 10/16/2018      Assessment & Plan:  1. Finger pain, left; 2.  Peripheral arterial disease He reports a recent fall while transferring from his wheelchair to another chair and he thinks that he may have hit his hand and left middle finger on another object or furniture in the home.  He reports slow wound healing and now has a raised nodule on the fingertip.  Patient has history of peripheral vascular disease.  He does have palpable radial pulse but slightly slow capillary refill but no obvious cyanosis at today's visit.  He has been referred to a hand surgeon for further evaluation.   Prescription for Silvadene cream to apply to abraded skin areas daily x10 days or until wounds are healed. - Ambulatory referral to Hand Surgery - silver sulfADIAZINE (SILVADENE) 1 % cream; Apply 1 application topically daily. To wound x 10 days or until healed  Dispense: 50 g; Refill: 0  3. Hypertension with renal disease Patient's blood pressure is elevated at today's visit and he states that it was also elevated at recent dialysis and recently has been higher than usual.  He will continue to have blood pressure monitored at dialysis as well as by home health and he is encouraged to contact his cardiologist if blood pressure remains elevated.  4.  Skin lesion of face Patient agrees to dermatology referral in follow-up of skin lesion at the left  temple and he likely would benefit from generalized skin check  An After Visit Summary was printed and given to the patient.   Follow-up: Return for chronic issues; sooner if continued HTN or concerns.   Approximately 20 minutes spent in face-to-face time with the patient to review current medical issues/concerns as well as performing examination, formulate treatment plan in place referrals.  Additional 12 minutes for secondary review of chart/recent hospitalizations, rehabilitation stay and completion of today's note.  [name], MD

## 2019-07-13 NOTE — Telephone Encounter (Signed)
Please give verbal order consent for requested home health

## 2019-07-13 NOTE — Progress Notes (Signed)
Finger pain

## 2019-07-14 ENCOUNTER — Encounter: Payer: Self-pay | Admitting: Family

## 2019-07-14 ENCOUNTER — Ambulatory Visit: Payer: Medicare Other | Admitting: Orthopedic Surgery

## 2019-07-14 VITALS — Ht 70.0 in | Wt 156.0 lb

## 2019-07-14 DIAGNOSIS — S60032A Contusion of left middle finger without damage to nail, initial encounter: Secondary | ICD-10-CM

## 2019-07-15 ENCOUNTER — Encounter: Payer: Self-pay | Admitting: Family

## 2019-07-15 DIAGNOSIS — D631 Anemia in chronic kidney disease: Secondary | ICD-10-CM | POA: Diagnosis not present

## 2019-07-15 DIAGNOSIS — D509 Iron deficiency anemia, unspecified: Secondary | ICD-10-CM | POA: Diagnosis not present

## 2019-07-15 DIAGNOSIS — I739 Peripheral vascular disease, unspecified: Secondary | ICD-10-CM | POA: Diagnosis not present

## 2019-07-15 DIAGNOSIS — N186 End stage renal disease: Secondary | ICD-10-CM | POA: Diagnosis not present

## 2019-07-15 DIAGNOSIS — Z992 Dependence on renal dialysis: Secondary | ICD-10-CM | POA: Diagnosis not present

## 2019-07-15 DIAGNOSIS — D688 Other specified coagulation defects: Secondary | ICD-10-CM | POA: Diagnosis not present

## 2019-07-15 DIAGNOSIS — N2581 Secondary hyperparathyroidism of renal origin: Secondary | ICD-10-CM | POA: Diagnosis not present

## 2019-07-15 NOTE — Telephone Encounter (Signed)
Jose Garrett and [facility] informing her with what provider stated and office number provided on vm

## 2019-07-15 NOTE — Progress Notes (Signed)
Office Visit Note   Patient: Jose Garrett           Date of Birth: 1951/03/06           MRN: [id number] Visit Date: 07/14/2019              Requested by: [name], MD [place],  [place] [id number] PCP: [name], MD  Chief Complaint  Patient presents with  . Left Hand - Pain      HPI: Patient is a 69 year old gentleman who is seen for initial evaluation for contusion to the left hand long finger.  Patient complains of a blood blister over the distal finger he has been using Silvadene for dressing changes  Assessment & Plan: Visit Diagnoses:  1. Contusion of left middle finger without damage to nail, initial encounter     Plan: Patient can continue with the Silvadene dressing changes discussed there is no evidence of a foreign body or abscess and follow-up as needed.  Follow-Up Instructions: Return if symptoms worsen or fail to improve.   Ortho Exam  Patient is alert, oriented, no adenopathy, well-dressed, normal affect, normal respiratory effort. Examination of the left hand patient has full active flexion extension of all digits.  He has a blood blister over the tip of the left long finger.  After informed consent a 10 blade knife was used to debride the skin and soft tissue with excision of the hematoma.  There is no foreign body no abscess no instability to the finger.  Imaging: No results found. No images are attached to the encounter.  Labs: Lab Results  Component Value Date   HGBA1C 5.0 10/16/2018   HGBA1C 6.2 (H) 03/26/2018   HGBA1C 5.7 (H) 01/23/2016   ESRSEDRATE 134 (H) 09/24/2014   REPTSTATUS 05/19/2019 FINAL 05/14/2019   CULT  05/14/2019    NO GROWTH 5 DAYS Performed at [facility], [place] [address] [place], [place] [address]    LABORGA ENTEROCOCCUS FAECALIS 05/11/2019     Lab Results  Component Value Date   ALBUMIN 2.7 (L) 05/27/2019   ALBUMIN 2.7 (L) 05/25/2019   ALBUMIN 2.5 (L) 05/22/2019    Lab Results    Component Value Date   MG 2.2 10/23/2018   MG 1.7 10/22/2018   MG 1.9 03/23/2018   Lab Results  Component Value Date   VD25OH 19 (L) 02/25/2014   VD25OH 15 (L) 11/20/2013    No results found for: PREALBUMIN CBC EXTENDED Latest Ref Rng & Units 05/27/2019 05/25/2019 05/22/2019  WBC 4.0 - 10.5 K/uL 4.5 5.4 5.2  RBC 4.22 - 5.81 MIL/uL 2.56(L) 2.43(L) 2.28(L)  HGB 13.0 - 17.0 g/dL 8.2(L) 7.9(L) 7.3(L)  HCT 39.0 - 52.0 % 26.5(L) 25.7(L) 23.6(L)  PLT 150 - 400 K/uL 97(L) 105(L) 108(L)  NEUTROABS 1.7 - 7.7 K/uL - - -  LYMPHSABS 0.7 - 4.0 K/uL - - -     Body mass index is 22.38 kg/m.  Orders:  No orders of the defined types were placed in this encounter.  No orders of the defined types were placed in this encounter.    Procedures: No procedures performed  Clinical Data: No additional findings.  ROS:  All other systems negative, except as noted in the HPI. Review of Systems  Objective: Vital Signs: Ht 5\' 10"  (1.778 m)   Wt 156 lb (70.8 kg)   BMI 22.38 kg/m   Specialty Comments:  No specialty comments available.  PMFS History: Patient Active  Problem List   Diagnosis Date Noted  . Back muscle spasm 07/09/2019  . Sleep disturbance   . Nausea without vomiting   . Orthostatic hypotension   . Acute on chronic anemia   . Chronic combined systolic and diastolic congestive heart failure ([place])   . ESRD on dialysis ([place])   . Right below-knee amputee ([place]) 05/18/2019  . Pressure injury of skin 05/14/2019  . Enterococcal bacteremia 05/13/2019  . Polymorphic ventricular tachycardia ([place]) 10/24/2018  . S/P TAVR (transcatheter aortic valve replacement) 10/21/2018  . Anxiety   . Coronary artery disease involving native coronary artery of native heart without angina pectoris   . Thrombocytopenia ([place])   . PAD (peripheral artery disease) ([place]) 07/22/2018  . Acute on chronic combined systolic and diastolic CHF (congestive heart failure) ([place]) 11/30/2017  . ESRD (end stage  renal disease) on dialysis ([place])   . Iron deficiency anemia 11/22/2015  . AL amyloidosis ([place]) 09/25/2014  . Anemia of chronic disease 09/25/2014  . Essential hypertension, benign 11/06/2013   Past Medical History:  Diagnosis Date  . Amyloidosis ([place])   . Anemia of chronic disease   . Anxiety   . CAD (coronary artery disease)    a. cardiac cath on 11/25/17 with occlusion of the RCA which could have been his event but the RCA could not be engaged. The RCA filled distally from left to right collaterals. There were no severe stenoses in the left coronary system. Medical therapy recommended, started on plavix.  . Chronic combined systolic and diastolic CHF (congestive heart failure) ([place])    a. EF dropped to 35-40% with grade 2 DD by echo 11/2017.  [name] DJD (degenerative joint disease), lumbar   . ESRD (end stage renal disease) ([place])    ESRD- HEMO MWF- [facility]  . GERD (gastroesophageal reflux disease)   . Hyperlipidemia   . Hypertension   . Myocardial infarction ([place])   . Neuropathy   . Peripheral vascular disease ([place])   . Pneumonia   . Restless legs   . S/P TAVR (transcatheter aortic valve replacement) 10/21/2018   26 mm Edwards Sapien 3 Ultra transcatheter heart valve placed via percutaneous right transfemoral approach   . Severe aortic stenosis    severe    Family History  Problem Relation Age of Onset  . Hypertension Mother   . Diabetes Mother   . Heart disease Mother   . Skin cancer Mother   . Hypertension Father   . Diabetes Father   . Diabetes Sister     Past Surgical History:  Procedure Laterality Date  . ABDOMINAL AORTOGRAM W/LOWER EXTREMITY Bilateral 07/16/2018   Procedure: ABDOMINAL AORTOGRAM W/LOWER EXTREMITY;  Surgeon: Wellington Hampshire, MD;  Location: [facility];  Service: Cardiovascular;  Laterality: Bilateral;  . ABDOMINAL AORTOGRAM W/LOWER EXTREMITY N/A 04/09/2019   Procedure: ABDOMINAL AORTOGRAM W/LOWER EXTREMITY - Right Lower;  Surgeon: Waynetta Sandy, MD;  Location: [facility];  Service: Cardiovascular;  Laterality: N/A;  . AMPUTATION Left 08/19/2018   Procedure: AMPUTATION LEFT TOES SECOND AND THIRD;  Surgeon: Waynetta Sandy, MD;  Location: [facility];  Service: Vascular;  Laterality: Left;  . AMPUTATION Right 05/07/2019   Procedure: AMPUTATION BELOW KNEE;  Surgeon: Serafina Mitchell, MD;  Location[facility];  Service: Vascular;  Laterality: Right;  . AV FISTULA PLACEMENT Right 12/27/2015   Procedure: Right Arm ARTERIOVENOUS (AV) FISTULA CREATION;  Surgeon: Angelia Mould, MD;  Location: [facility];  Service: Vascular;  Laterality:  Right;  [name] CATARACT EXTRACTION W/ INTRAOCULAR LENS  IMPLANT, BILATERAL    . COLONOSCOPY    . ERCP W/ SPHICTEROTOMY    . FEMORAL-POPLITEAL BYPASS GRAFT Left 07/22/2018   Procedure: left FEMORAL TO BELOW KNEE POPLITEAL ARTERY BYPASS GREAFT using 61mm removable ring propaten gore graft;  Surgeon: Waynetta Sandy, MD;  Location: [facility];  Service: Vascular;  Laterality: Left;  . FEMORAL-POPLITEAL BYPASS GRAFT Right 04/10/2019   Procedure: BYPASS GRAFT RIGHT FEMORAL-POPLITEAL ARTERY;  Surgeon: Rosetta Posner, MD;  Location: [facility];  Service: Vascular;  Laterality: Right;  . I & D EXTREMITY Right 05/05/2019   Procedure: Revision of  RIGHT  FOOT Amputation;  Surgeon: Waynetta Sandy, MD;  Location: [facility];  Service: Vascular;  Laterality: Right;  . INTRAOPERATIVE TRANSTHORACIC ECHOCARDIOGRAM N/A 10/21/2018   Procedure: Intraoperative Transthoracic Echocardiogram;  Surgeon: Sherren Mocha, MD;  Location: [facility];  Service: Open Heart Surgery;  Laterality: N/A;  . IR GENERIC HISTORICAL  01/27/2016   IR FLUORO GUIDE CV LINE RIGHT 01/27/2016 Arne Cleveland, MD [facility]  . IR GENERIC HISTORICAL  01/27/2016   IR US GUIDE VASC ACCESS RIGHT 01/27/2016 Arne Cleveland, MD [facility]  . LAPAROSCOPIC CHOLECYSTECTOMY  03/2011  . LEFT HEART CATH AND CORONARY ANGIOGRAPHY N/A 11/25/2017    Procedure: LEFT HEART CATH AND CORONARY ANGIOGRAPHY;  Surgeon: Martinique, Peter M, MD;  Location: [facility];  Service: Cardiovascular;  Laterality: N/A;  . LEFT HEART CATH AND CORONARY ANGIOGRAPHY N/A 07/24/2018   Procedure: LEFT HEART CATH AND CORONARY ANGIOGRAPHY;  Surgeon: Sherren Mocha, MD;  Location: [facility] [facility];  Service: Cardiovascular;  Laterality: N/A;  . RENAL BIOPSY, PERCUTANEOUS Right 09/08/2014  . RIGHT/LEFT HEART CATH AND CORONARY ANGIOGRAPHY N/A 03/25/2018   Procedure: RIGHT/LEFT HEART CATH AND CORONARY ANGIOGRAPHY;  Surgeon: Martinique, Peter M, MD;  Location: [facility];  Service: Cardiovascular;  Laterality: N/A;  . TEE WITHOUT CARDIOVERSION N/A 05/14/2019   Procedure: TRANSESOPHAGEAL ECHOCARDIOGRAM (TEE);  Surgeon: Fay Records, MD;  Location: [facility] ENDOSCOPY;  Service: Cardiovascular;  Laterality: N/A;  . TOE AMPUTATION Left 08/19/2018   2nd & 3rd toes  . TONSILLECTOMY  ~ 1960  . TRANSCATHETER AORTIC VALVE REPLACEMENT, TRANSFEMORAL N/A 10/21/2018   Procedure: TRANSCATHETER AORTIC VALVE REPLACEMENT, TRANSFEMORAL;  Surgeon: Sherren Mocha, MD;  Location: [facility];  Service: Open Heart Surgery;  Laterality: N/A;  . TRANSMETATARSAL AMPUTATION Right 04/29/2019   Procedure: RIGHT TRANSMETATARSAL AMPUTATION;  Surgeon: Waynetta Sandy, MD;  Location: [facility];  Service: Vascular;  Laterality: Right;  . ULTRASOUND GUIDANCE FOR VASCULAR ACCESS  03/25/2018   Procedure: Ultrasound Guidance For Vascular Access;  Surgeon: Martinique, Peter M, MD;  Location: [place];  Service: Cardiovascular;;   Social History   Occupational History  . Not on file  Tobacco Use  . Smoking status: Former Smoker    Packs/day: 2.00    Years: 40.00    Pack years: 80.00    Types: Cigarettes    Quit date: 03/19/2011    Years since quitting: 8.3  . Smokeless tobacco: Never Used  Substance and Sexual Activity  . Alcohol use: No    Alcohol/week: 0.0 standard drinks  . Drug use: No     Comment:  "I have used marijuana till the 1990's". Notes that he quit 15 yrs ago  . Sexual activity: Not Currently

## 2019-07-16 DIAGNOSIS — D631 Anemia in chronic kidney disease: Secondary | ICD-10-CM | POA: Diagnosis not present

## 2019-07-16 DIAGNOSIS — N186 End stage renal disease: Secondary | ICD-10-CM | POA: Diagnosis not present

## 2019-07-16 DIAGNOSIS — I350 Nonrheumatic aortic (valve) stenosis: Secondary | ICD-10-CM | POA: Diagnosis not present

## 2019-07-16 DIAGNOSIS — I255 Ischemic cardiomyopathy: Secondary | ICD-10-CM | POA: Diagnosis not present

## 2019-07-16 DIAGNOSIS — I5042 Chronic combined systolic (congestive) and diastolic (congestive) heart failure: Secondary | ICD-10-CM | POA: Diagnosis not present

## 2019-07-16 DIAGNOSIS — I2510 Atherosclerotic heart disease of native coronary artery without angina pectoris: Secondary | ICD-10-CM | POA: Diagnosis not present

## 2019-07-16 DIAGNOSIS — N2581 Secondary hyperparathyroidism of renal origin: Secondary | ICD-10-CM | POA: Diagnosis not present

## 2019-07-16 DIAGNOSIS — I132 Hypertensive heart and chronic kidney disease with heart failure and with stage 5 chronic kidney disease, or end stage renal disease: Secondary | ICD-10-CM | POA: Diagnosis not present

## 2019-07-16 DIAGNOSIS — Z4781 Encounter for orthopedic aftercare following surgical amputation: Secondary | ICD-10-CM | POA: Diagnosis not present

## 2019-07-16 DIAGNOSIS — I70221 Atherosclerosis of native arteries of extremities with rest pain, right leg: Secondary | ICD-10-CM | POA: Diagnosis not present

## 2019-07-16 DIAGNOSIS — I951 Orthostatic hypotension: Secondary | ICD-10-CM | POA: Diagnosis not present

## 2019-07-16 DIAGNOSIS — M5136 Other intervertebral disc degeneration, lumbar region: Secondary | ICD-10-CM | POA: Diagnosis not present

## 2019-07-16 DIAGNOSIS — E859 Amyloidosis, unspecified: Secondary | ICD-10-CM | POA: Diagnosis not present

## 2019-07-17 DIAGNOSIS — D509 Iron deficiency anemia, unspecified: Secondary | ICD-10-CM | POA: Diagnosis not present

## 2019-07-17 DIAGNOSIS — Z992 Dependence on renal dialysis: Secondary | ICD-10-CM | POA: Diagnosis not present

## 2019-07-17 DIAGNOSIS — N186 End stage renal disease: Secondary | ICD-10-CM | POA: Diagnosis not present

## 2019-07-17 DIAGNOSIS — D688 Other specified coagulation defects: Secondary | ICD-10-CM | POA: Diagnosis not present

## 2019-07-17 DIAGNOSIS — N2581 Secondary hyperparathyroidism of renal origin: Secondary | ICD-10-CM | POA: Diagnosis not present

## 2019-07-17 DIAGNOSIS — D631 Anemia in chronic kidney disease: Secondary | ICD-10-CM | POA: Diagnosis not present

## 2019-07-20 DIAGNOSIS — N2581 Secondary hyperparathyroidism of renal origin: Secondary | ICD-10-CM | POA: Diagnosis not present

## 2019-07-20 DIAGNOSIS — D509 Iron deficiency anemia, unspecified: Secondary | ICD-10-CM | POA: Diagnosis not present

## 2019-07-20 DIAGNOSIS — D688 Other specified coagulation defects: Secondary | ICD-10-CM | POA: Diagnosis not present

## 2019-07-20 DIAGNOSIS — N186 End stage renal disease: Secondary | ICD-10-CM | POA: Diagnosis not present

## 2019-07-20 DIAGNOSIS — Z992 Dependence on renal dialysis: Secondary | ICD-10-CM | POA: Diagnosis not present

## 2019-07-20 DIAGNOSIS — D631 Anemia in chronic kidney disease: Secondary | ICD-10-CM | POA: Diagnosis not present

## 2019-07-21 DIAGNOSIS — I255 Ischemic cardiomyopathy: Secondary | ICD-10-CM | POA: Diagnosis not present

## 2019-07-21 DIAGNOSIS — I350 Nonrheumatic aortic (valve) stenosis: Secondary | ICD-10-CM | POA: Diagnosis not present

## 2019-07-21 DIAGNOSIS — E859 Amyloidosis, unspecified: Secondary | ICD-10-CM | POA: Diagnosis not present

## 2019-07-21 DIAGNOSIS — Z4781 Encounter for orthopedic aftercare following surgical amputation: Secondary | ICD-10-CM | POA: Diagnosis not present

## 2019-07-21 DIAGNOSIS — N2581 Secondary hyperparathyroidism of renal origin: Secondary | ICD-10-CM | POA: Diagnosis not present

## 2019-07-21 DIAGNOSIS — N186 End stage renal disease: Secondary | ICD-10-CM | POA: Diagnosis not present

## 2019-07-21 DIAGNOSIS — I5042 Chronic combined systolic (congestive) and diastolic (congestive) heart failure: Secondary | ICD-10-CM | POA: Diagnosis not present

## 2019-07-21 DIAGNOSIS — D631 Anemia in chronic kidney disease: Secondary | ICD-10-CM | POA: Diagnosis not present

## 2019-07-21 DIAGNOSIS — M5136 Other intervertebral disc degeneration, lumbar region: Secondary | ICD-10-CM | POA: Diagnosis not present

## 2019-07-21 DIAGNOSIS — I2510 Atherosclerotic heart disease of native coronary artery without angina pectoris: Secondary | ICD-10-CM | POA: Diagnosis not present

## 2019-07-21 DIAGNOSIS — I70221 Atherosclerosis of native arteries of extremities with rest pain, right leg: Secondary | ICD-10-CM | POA: Diagnosis not present

## 2019-07-21 DIAGNOSIS — I951 Orthostatic hypotension: Secondary | ICD-10-CM | POA: Diagnosis not present

## 2019-07-21 DIAGNOSIS — I132 Hypertensive heart and chronic kidney disease with heart failure and with stage 5 chronic kidney disease, or end stage renal disease: Secondary | ICD-10-CM | POA: Diagnosis not present

## 2019-07-22 DIAGNOSIS — N186 End stage renal disease: Secondary | ICD-10-CM | POA: Diagnosis not present

## 2019-07-22 DIAGNOSIS — D631 Anemia in chronic kidney disease: Secondary | ICD-10-CM | POA: Diagnosis not present

## 2019-07-22 DIAGNOSIS — N2581 Secondary hyperparathyroidism of renal origin: Secondary | ICD-10-CM | POA: Diagnosis not present

## 2019-07-22 DIAGNOSIS — D509 Iron deficiency anemia, unspecified: Secondary | ICD-10-CM | POA: Diagnosis not present

## 2019-07-22 DIAGNOSIS — D688 Other specified coagulation defects: Secondary | ICD-10-CM | POA: Diagnosis not present

## 2019-07-22 DIAGNOSIS — Z992 Dependence on renal dialysis: Secondary | ICD-10-CM | POA: Diagnosis not present

## 2019-07-23 DIAGNOSIS — I132 Hypertensive heart and chronic kidney disease with heart failure and with stage 5 chronic kidney disease, or end stage renal disease: Secondary | ICD-10-CM | POA: Diagnosis not present

## 2019-07-23 DIAGNOSIS — N186 End stage renal disease: Secondary | ICD-10-CM | POA: Diagnosis not present

## 2019-07-23 DIAGNOSIS — E859 Amyloidosis, unspecified: Secondary | ICD-10-CM | POA: Diagnosis not present

## 2019-07-23 DIAGNOSIS — I5042 Chronic combined systolic (congestive) and diastolic (congestive) heart failure: Secondary | ICD-10-CM | POA: Diagnosis not present

## 2019-07-23 DIAGNOSIS — I70221 Atherosclerosis of native arteries of extremities with rest pain, right leg: Secondary | ICD-10-CM | POA: Diagnosis not present

## 2019-07-23 DIAGNOSIS — M5136 Other intervertebral disc degeneration, lumbar region: Secondary | ICD-10-CM | POA: Diagnosis not present

## 2019-07-23 DIAGNOSIS — Z4781 Encounter for orthopedic aftercare following surgical amputation: Secondary | ICD-10-CM | POA: Diagnosis not present

## 2019-07-23 DIAGNOSIS — I350 Nonrheumatic aortic (valve) stenosis: Secondary | ICD-10-CM | POA: Diagnosis not present

## 2019-07-23 DIAGNOSIS — I951 Orthostatic hypotension: Secondary | ICD-10-CM | POA: Diagnosis not present

## 2019-07-23 DIAGNOSIS — N2581 Secondary hyperparathyroidism of renal origin: Secondary | ICD-10-CM | POA: Diagnosis not present

## 2019-07-23 DIAGNOSIS — D631 Anemia in chronic kidney disease: Secondary | ICD-10-CM | POA: Diagnosis not present

## 2019-07-23 DIAGNOSIS — I2510 Atherosclerotic heart disease of native coronary artery without angina pectoris: Secondary | ICD-10-CM | POA: Diagnosis not present

## 2019-07-23 DIAGNOSIS — I255 Ischemic cardiomyopathy: Secondary | ICD-10-CM | POA: Diagnosis not present

## 2019-07-24 DIAGNOSIS — D631 Anemia in chronic kidney disease: Secondary | ICD-10-CM | POA: Diagnosis not present

## 2019-07-24 DIAGNOSIS — N2581 Secondary hyperparathyroidism of renal origin: Secondary | ICD-10-CM | POA: Diagnosis not present

## 2019-07-24 DIAGNOSIS — D688 Other specified coagulation defects: Secondary | ICD-10-CM | POA: Diagnosis not present

## 2019-07-24 DIAGNOSIS — N186 End stage renal disease: Secondary | ICD-10-CM | POA: Diagnosis not present

## 2019-07-24 DIAGNOSIS — Z992 Dependence on renal dialysis: Secondary | ICD-10-CM | POA: Diagnosis not present

## 2019-07-24 DIAGNOSIS — D509 Iron deficiency anemia, unspecified: Secondary | ICD-10-CM | POA: Diagnosis not present

## 2019-07-27 DIAGNOSIS — D631 Anemia in chronic kidney disease: Secondary | ICD-10-CM | POA: Diagnosis not present

## 2019-07-27 DIAGNOSIS — N2581 Secondary hyperparathyroidism of renal origin: Secondary | ICD-10-CM | POA: Diagnosis not present

## 2019-07-27 DIAGNOSIS — N186 End stage renal disease: Secondary | ICD-10-CM | POA: Diagnosis not present

## 2019-07-27 DIAGNOSIS — Z992 Dependence on renal dialysis: Secondary | ICD-10-CM | POA: Diagnosis not present

## 2019-07-27 DIAGNOSIS — D688 Other specified coagulation defects: Secondary | ICD-10-CM | POA: Diagnosis not present

## 2019-07-27 DIAGNOSIS — D509 Iron deficiency anemia, unspecified: Secondary | ICD-10-CM | POA: Diagnosis not present

## 2019-07-28 ENCOUNTER — Telehealth: Payer: Self-pay | Admitting: Family Medicine

## 2019-07-28 NOTE — Telephone Encounter (Signed)
Requesting for OT orders for 1x a week for 3 weeks and to decertify for 1x a week for 4 weeks.

## 2019-07-28 NOTE — Telephone Encounter (Signed)
Please give approval for verbal orders

## 2019-07-29 DIAGNOSIS — Z992 Dependence on renal dialysis: Secondary | ICD-10-CM | POA: Diagnosis not present

## 2019-07-29 DIAGNOSIS — D509 Iron deficiency anemia, unspecified: Secondary | ICD-10-CM | POA: Diagnosis not present

## 2019-07-29 DIAGNOSIS — N186 End stage renal disease: Secondary | ICD-10-CM | POA: Diagnosis not present

## 2019-07-29 DIAGNOSIS — D688 Other specified coagulation defects: Secondary | ICD-10-CM | POA: Diagnosis not present

## 2019-07-29 DIAGNOSIS — N2581 Secondary hyperparathyroidism of renal origin: Secondary | ICD-10-CM | POA: Diagnosis not present

## 2019-07-29 DIAGNOSIS — D631 Anemia in chronic kidney disease: Secondary | ICD-10-CM | POA: Diagnosis not present

## 2019-07-30 ENCOUNTER — Telehealth: Payer: Self-pay

## 2019-07-30 DIAGNOSIS — E859 Amyloidosis, unspecified: Secondary | ICD-10-CM | POA: Diagnosis not present

## 2019-07-30 DIAGNOSIS — Z4781 Encounter for orthopedic aftercare following surgical amputation: Secondary | ICD-10-CM | POA: Diagnosis not present

## 2019-07-30 DIAGNOSIS — N186 End stage renal disease: Secondary | ICD-10-CM | POA: Diagnosis not present

## 2019-07-30 DIAGNOSIS — I2510 Atherosclerotic heart disease of native coronary artery without angina pectoris: Secondary | ICD-10-CM | POA: Diagnosis not present

## 2019-07-30 DIAGNOSIS — N2581 Secondary hyperparathyroidism of renal origin: Secondary | ICD-10-CM | POA: Diagnosis not present

## 2019-07-30 DIAGNOSIS — I350 Nonrheumatic aortic (valve) stenosis: Secondary | ICD-10-CM | POA: Diagnosis not present

## 2019-07-30 DIAGNOSIS — I132 Hypertensive heart and chronic kidney disease with heart failure and with stage 5 chronic kidney disease, or end stage renal disease: Secondary | ICD-10-CM | POA: Diagnosis not present

## 2019-07-30 DIAGNOSIS — I255 Ischemic cardiomyopathy: Secondary | ICD-10-CM | POA: Diagnosis not present

## 2019-07-30 DIAGNOSIS — I951 Orthostatic hypotension: Secondary | ICD-10-CM | POA: Diagnosis not present

## 2019-07-30 DIAGNOSIS — D631 Anemia in chronic kidney disease: Secondary | ICD-10-CM | POA: Diagnosis not present

## 2019-07-30 DIAGNOSIS — M5136 Other intervertebral disc degeneration, lumbar region: Secondary | ICD-10-CM | POA: Diagnosis not present

## 2019-07-30 DIAGNOSIS — I5042 Chronic combined systolic (congestive) and diastolic (congestive) heart failure: Secondary | ICD-10-CM | POA: Diagnosis not present

## 2019-07-30 DIAGNOSIS — I70221 Atherosclerosis of native arteries of extremities with rest pain, right leg: Secondary | ICD-10-CM | POA: Diagnosis not present

## 2019-07-30 NOTE — Telephone Encounter (Signed)
Call placed to [facility], [place]  # [phone] and provided approval for OT  1x/week x 3 weeks and then 1x/week x 4 weeks.

## 2019-07-31 DIAGNOSIS — D631 Anemia in chronic kidney disease: Secondary | ICD-10-CM | POA: Diagnosis not present

## 2019-07-31 DIAGNOSIS — N186 End stage renal disease: Secondary | ICD-10-CM | POA: Diagnosis not present

## 2019-07-31 DIAGNOSIS — Z992 Dependence on renal dialysis: Secondary | ICD-10-CM | POA: Diagnosis not present

## 2019-07-31 DIAGNOSIS — D688 Other specified coagulation defects: Secondary | ICD-10-CM | POA: Diagnosis not present

## 2019-07-31 DIAGNOSIS — D509 Iron deficiency anemia, unspecified: Secondary | ICD-10-CM | POA: Diagnosis not present

## 2019-07-31 DIAGNOSIS — N2581 Secondary hyperparathyroidism of renal origin: Secondary | ICD-10-CM | POA: Diagnosis not present

## 2019-08-01 DIAGNOSIS — Z4781 Encounter for orthopedic aftercare following surgical amputation: Secondary | ICD-10-CM | POA: Diagnosis not present

## 2019-08-01 DIAGNOSIS — N186 End stage renal disease: Secondary | ICD-10-CM | POA: Diagnosis not present

## 2019-08-01 DIAGNOSIS — I2510 Atherosclerotic heart disease of native coronary artery without angina pectoris: Secondary | ICD-10-CM | POA: Diagnosis not present

## 2019-08-01 DIAGNOSIS — E859 Amyloidosis, unspecified: Secondary | ICD-10-CM | POA: Diagnosis not present

## 2019-08-01 DIAGNOSIS — N2581 Secondary hyperparathyroidism of renal origin: Secondary | ICD-10-CM | POA: Diagnosis not present

## 2019-08-01 DIAGNOSIS — D631 Anemia in chronic kidney disease: Secondary | ICD-10-CM | POA: Diagnosis not present

## 2019-08-01 DIAGNOSIS — I5042 Chronic combined systolic (congestive) and diastolic (congestive) heart failure: Secondary | ICD-10-CM | POA: Diagnosis not present

## 2019-08-01 DIAGNOSIS — I132 Hypertensive heart and chronic kidney disease with heart failure and with stage 5 chronic kidney disease, or end stage renal disease: Secondary | ICD-10-CM | POA: Diagnosis not present

## 2019-08-01 DIAGNOSIS — I70221 Atherosclerosis of native arteries of extremities with rest pain, right leg: Secondary | ICD-10-CM | POA: Diagnosis not present

## 2019-08-01 DIAGNOSIS — I255 Ischemic cardiomyopathy: Secondary | ICD-10-CM | POA: Diagnosis not present

## 2019-08-01 DIAGNOSIS — M5136 Other intervertebral disc degeneration, lumbar region: Secondary | ICD-10-CM | POA: Diagnosis not present

## 2019-08-01 DIAGNOSIS — I951 Orthostatic hypotension: Secondary | ICD-10-CM | POA: Diagnosis not present

## 2019-08-01 DIAGNOSIS — I350 Nonrheumatic aortic (valve) stenosis: Secondary | ICD-10-CM | POA: Diagnosis not present

## 2019-08-03 DIAGNOSIS — D631 Anemia in chronic kidney disease: Secondary | ICD-10-CM | POA: Diagnosis not present

## 2019-08-03 DIAGNOSIS — D688 Other specified coagulation defects: Secondary | ICD-10-CM | POA: Diagnosis not present

## 2019-08-03 DIAGNOSIS — D509 Iron deficiency anemia, unspecified: Secondary | ICD-10-CM | POA: Diagnosis not present

## 2019-08-03 DIAGNOSIS — N2581 Secondary hyperparathyroidism of renal origin: Secondary | ICD-10-CM | POA: Diagnosis not present

## 2019-08-03 DIAGNOSIS — N186 End stage renal disease: Secondary | ICD-10-CM | POA: Diagnosis not present

## 2019-08-03 DIAGNOSIS — Z992 Dependence on renal dialysis: Secondary | ICD-10-CM | POA: Diagnosis not present

## 2019-08-04 DIAGNOSIS — Z4781 Encounter for orthopedic aftercare following surgical amputation: Secondary | ICD-10-CM | POA: Diagnosis not present

## 2019-08-04 DIAGNOSIS — N186 End stage renal disease: Secondary | ICD-10-CM | POA: Diagnosis not present

## 2019-08-04 DIAGNOSIS — I70221 Atherosclerosis of native arteries of extremities with rest pain, right leg: Secondary | ICD-10-CM | POA: Diagnosis not present

## 2019-08-04 DIAGNOSIS — I350 Nonrheumatic aortic (valve) stenosis: Secondary | ICD-10-CM | POA: Diagnosis not present

## 2019-08-04 DIAGNOSIS — N2581 Secondary hyperparathyroidism of renal origin: Secondary | ICD-10-CM | POA: Diagnosis not present

## 2019-08-04 DIAGNOSIS — I255 Ischemic cardiomyopathy: Secondary | ICD-10-CM | POA: Diagnosis not present

## 2019-08-04 DIAGNOSIS — M5136 Other intervertebral disc degeneration, lumbar region: Secondary | ICD-10-CM | POA: Diagnosis not present

## 2019-08-04 DIAGNOSIS — I951 Orthostatic hypotension: Secondary | ICD-10-CM | POA: Diagnosis not present

## 2019-08-04 DIAGNOSIS — E859 Amyloidosis, unspecified: Secondary | ICD-10-CM | POA: Diagnosis not present

## 2019-08-04 DIAGNOSIS — I5042 Chronic combined systolic (congestive) and diastolic (congestive) heart failure: Secondary | ICD-10-CM | POA: Diagnosis not present

## 2019-08-04 DIAGNOSIS — D631 Anemia in chronic kidney disease: Secondary | ICD-10-CM | POA: Diagnosis not present

## 2019-08-04 DIAGNOSIS — I2510 Atherosclerotic heart disease of native coronary artery without angina pectoris: Secondary | ICD-10-CM | POA: Diagnosis not present

## 2019-08-04 DIAGNOSIS — I132 Hypertensive heart and chronic kidney disease with heart failure and with stage 5 chronic kidney disease, or end stage renal disease: Secondary | ICD-10-CM | POA: Diagnosis not present

## 2019-08-05 DIAGNOSIS — D631 Anemia in chronic kidney disease: Secondary | ICD-10-CM | POA: Diagnosis not present

## 2019-08-05 DIAGNOSIS — Z992 Dependence on renal dialysis: Secondary | ICD-10-CM | POA: Diagnosis not present

## 2019-08-05 DIAGNOSIS — D688 Other specified coagulation defects: Secondary | ICD-10-CM | POA: Diagnosis not present

## 2019-08-05 DIAGNOSIS — D509 Iron deficiency anemia, unspecified: Secondary | ICD-10-CM | POA: Diagnosis not present

## 2019-08-05 DIAGNOSIS — N2581 Secondary hyperparathyroidism of renal origin: Secondary | ICD-10-CM | POA: Diagnosis not present

## 2019-08-05 DIAGNOSIS — N186 End stage renal disease: Secondary | ICD-10-CM | POA: Diagnosis not present

## 2019-08-06 ENCOUNTER — Encounter: Payer: Medicare Other | Attending: Registered Nurse | Admitting: Physical Medicine & Rehabilitation

## 2019-08-06 ENCOUNTER — Ambulatory Visit (INDEPENDENT_AMBULATORY_CARE_PROVIDER_SITE_OTHER): Payer: Medicare Other | Admitting: Physician Assistant

## 2019-08-06 ENCOUNTER — Other Ambulatory Visit: Payer: Self-pay

## 2019-08-06 ENCOUNTER — Encounter: Payer: Self-pay | Admitting: Physical Medicine & Rehabilitation

## 2019-08-06 VITALS — BP 143/72 | HR 77 | Temp 98.3°F | Resp 20 | Ht 70.0 in | Wt 152.0 lb

## 2019-08-06 VITALS — BP 153/56 | HR 76 | Temp 97.9°F | Ht 70.0 in | Wt 152.0 lb

## 2019-08-06 DIAGNOSIS — G622 Polyneuropathy due to other toxic agents: Secondary | ICD-10-CM

## 2019-08-06 DIAGNOSIS — R269 Unspecified abnormalities of gait and mobility: Secondary | ICD-10-CM | POA: Insufficient documentation

## 2019-08-06 DIAGNOSIS — Z992 Dependence on renal dialysis: Secondary | ICD-10-CM | POA: Diagnosis present

## 2019-08-06 DIAGNOSIS — R7881 Bacteremia: Secondary | ICD-10-CM | POA: Insufficient documentation

## 2019-08-06 DIAGNOSIS — I739 Peripheral vascular disease, unspecified: Secondary | ICD-10-CM | POA: Diagnosis not present

## 2019-08-06 DIAGNOSIS — I5042 Chronic combined systolic (congestive) and diastolic (congestive) heart failure: Secondary | ICD-10-CM | POA: Diagnosis not present

## 2019-08-06 DIAGNOSIS — N186 End stage renal disease: Secondary | ICD-10-CM | POA: Diagnosis present

## 2019-08-06 DIAGNOSIS — M62830 Muscle spasm of back: Secondary | ICD-10-CM

## 2019-08-06 DIAGNOSIS — Z95828 Presence of other vascular implants and grafts: Secondary | ICD-10-CM | POA: Diagnosis not present

## 2019-08-06 DIAGNOSIS — Z89511 Acquired absence of right leg below knee: Secondary | ICD-10-CM | POA: Diagnosis present

## 2019-08-06 NOTE — Progress Notes (Signed)
Subjective:    Patient ID: Jose Garrett, male    DOB: 1950-11-03, 69 y.o.   MRN: [id number]  HPI 69 year old right-handed male with history of remote tobacco abuse end-stage renal disease due to cardiorenal syndrome in the setting of AL amyloidosis, aortic stenosis, hypertension CHF presents for hospital follow up for debility secondary to fem-pop bypass complicated by NSTEMI.   Last clinic visit on 07/09/2019.  Since that time, patient states he is following up with Vascular with uncertain plans. He is following up with prosthetist for potential cast next week. He did not try Lidocaine patch and states his back has improved.  He notes he is taking ativan, which is helping him sleep. Denies falls. He notes improvement in swelling. He is occasionally using Tizanidine, ~2/week.   Pain Inventory Average Pain 5 Pain Right Now 7 My pain is intermittent, stabbing, tingling and aching  In the last 24 hours, has pain interfered with the following? General activity 5 Relation with others 6 Enjoyment of life 5 What TIME of day is your pain at its worst? evening Sleep (in general) Poor  Pain is worse with: inactivity Pain improves with: therapy/exercise and medication Relief from Meds: 5  Mobility walk with assistance use a walker ability to climb steps?  no do you drive?  no use a wheelchair  Function retired I need assistance with the following:  household duties and shopping  Neuro/Psych tremor tingling trouble walking spasms  Prior Studies Any changes since last visit?  no  Physicians involved in your care Any changes since last visit?  no   Family History  Problem Relation Age of Onset  . Hypertension Mother   . Diabetes Mother   . Heart disease Mother   . Skin cancer Mother   . Hypertension Father   . Diabetes Father   . Diabetes Sister    Social History   Socioeconomic History  . Marital status: Divorced    Spouse name: Not on file  . Number of children:  Not on file  . Years of education: Not on file  . Highest education level: Not on file  Occupational History  . Not on file  Tobacco Use  . Smoking status: Former Smoker    Packs/day: 2.00    Years: 40.00    Pack years: 80.00    Types: Cigarettes    Quit date: 03/19/2011    Years since quitting: 8.3  . Smokeless tobacco: Never Used  Substance and Sexual Activity  . Alcohol use: No    Alcohol/week: 0.0 standard drinks  . Drug use: No    Comment:  "I have used marijuana till the 1990's". Notes that he quit 15 yrs ago  . Sexual activity: Not Currently  Other Topics Concern  . 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    Social Determinants of Health   Financial Resource Strain:   . Difficulty of Paying Living Expenses:   Food Insecurity:   . Worried About Charity fundraiser in the Last Year:   . Arboriculturist in the Last Year:   Transportation Needs:   . Film/video editor (Medical):   [name] Lack of Transportation (Non-Medical):   Physical Activity:   . Days of Exercise per Week:   . Minutes of Exercise per Session:  Stress:   . Feeling of Stress :   Social Connections:   . Frequency of Communication with Friends and Family:   . Frequency of Social Gatherings with Friends and Family:   . Attends Religious Services:   . Active Member of Clubs or Organizations:   . Attends Archivist Meetings:   [name] Marital Status:    Past Surgical History:  Procedure Laterality Date  . ABDOMINAL AORTOGRAM W/LOWER EXTREMITY Bilateral 07/16/2018   Procedure: ABDOMINAL AORTOGRAM W/LOWER EXTREMITY;  Surgeon: Wellington Hampshire, MD;  Location: [facility];  Service: Cardiovascular;  Laterality: Bilateral;  . ABDOMINAL AORTOGRAM W/LOWER EXTREMITY N/A 04/09/2019   Procedure: ABDOMINAL AORTOGRAM W/LOWER EXTREMITY - Right Lower;  Surgeon:  Waynetta Sandy, MD;  Location: [facility];  Service: Cardiovascular;  Laterality: N/A;  . AMPUTATION Left 08/19/2018   Procedure: AMPUTATION LEFT TOES SECOND AND THIRD;  Surgeon: Waynetta Sandy, MD;  Location: [facility];  Service: Vascular;  Laterality: Left;  . AMPUTATION Right 05/07/2019   Procedure: AMPUTATION BELOW KNEE;  Surgeon: Serafina Mitchell, MD;  Location[facility];  Service: Vascular;  Laterality: Right;  . AV FISTULA PLACEMENT Right 12/27/2015   Procedure: Right Arm ARTERIOVENOUS (AV) FISTULA CREATION;  Surgeon: Angelia Mould, MD;  Location: [facility];  Service: Vascular;  Laterality: Right;  . CATARACT EXTRACTION W/ INTRAOCULAR LENS  IMPLANT, BILATERAL    . COLONOSCOPY    . ERCP W/ SPHICTEROTOMY    . FEMORAL-POPLITEAL BYPASS GRAFT Left 07/22/2018   Procedure: left FEMORAL TO BELOW KNEE POPLITEAL ARTERY BYPASS GREAFT using 62mm removable ring propaten gore graft;  Surgeon: Waynetta Sandy, MD;  Location: [place];  Service: Vascular;  Laterality: Left;  . FEMORAL-POPLITEAL BYPASS GRAFT Right 04/10/2019   Procedure: BYPASS GRAFT RIGHT FEMORAL-POPLITEAL ARTERY;  Surgeon: Rosetta Posner, MD;  Location: [facility];  Service: Vascular;  Laterality: Right;  . I & D EXTREMITY Right 05/05/2019   Procedure: Revision of  RIGHT  FOOT Amputation;  Surgeon: Waynetta Sandy, MD;  Location: [facility];  Service: Vascular;  Laterality: Right;  . INTRAOPERATIVE TRANSTHORACIC ECHOCARDIOGRAM N/A 10/21/2018   Procedure: Intraoperative Transthoracic Echocardiogram;  Surgeon: Sherren Mocha, MD;  Location: [facility];  Service: Open Heart Surgery;  Laterality: N/A;  . IR GENERIC HISTORICAL  01/27/2016   IR FLUORO GUIDE CV LINE RIGHT 01/27/2016 Arne Cleveland, MD [facility]  . IR GENERIC HISTORICAL  01/27/2016   IR US GUIDE VASC ACCESS RIGHT 01/27/2016 Arne Cleveland, MD [facility]  . LAPAROSCOPIC CHOLECYSTECTOMY  03/2011  . LEFT HEART CATH AND CORONARY ANGIOGRAPHY N/A  11/25/2017   Procedure: LEFT HEART CATH AND CORONARY ANGIOGRAPHY;  Surgeon: Martinique, Peter M, MD;  Location: [facility];  Service: Cardiovascular;  Laterality: N/A;  . LEFT HEART CATH AND CORONARY ANGIOGRAPHY N/A 07/24/2018   Procedure: LEFT HEART CATH AND CORONARY ANGIOGRAPHY;  Surgeon: Sherren Mocha, MD;  Location: [facility];  Service: Cardiovascular;  Laterality: N/A;  . RENAL BIOPSY, PERCUTANEOUS Right 09/08/2014  . RIGHT/LEFT HEART CATH AND CORONARY ANGIOGRAPHY N/A 03/25/2018   Procedure: RIGHT/LEFT HEART CATH AND CORONARY ANGIOGRAPHY;  Surgeon: Martinique, Peter M, MD;  Location: [facility] [place] [facility];  Service: Cardiovascular;  Laterality: N/A;  . TEE WITHOUT CARDIOVERSION N/A 05/14/2019   Procedure: TRANSESOPHAGEAL ECHOCARDIOGRAM (TEE);  Surgeon: Fay Records, MD;  Location: [facility];  Service: Cardiovascular;  Laterality: N/A;  . TOE AMPUTATION Left 08/19/2018   2nd & 3rd toes  .  TONSILLECTOMY  ~ 1960  . TRANSCATHETER AORTIC VALVE REPLACEMENT, TRANSFEMORAL N/A 10/21/2018   Procedure: TRANSCATHETER AORTIC VALVE REPLACEMENT, TRANSFEMORAL;  Surgeon: Sherren Mocha, MD;  Location: [facility];  Service: Open Heart Surgery;  Laterality: N/A;  . TRANSMETATARSAL AMPUTATION Right 04/29/2019   Procedure: RIGHT TRANSMETATARSAL AMPUTATION;  Surgeon: Waynetta Sandy, MD;  Location: [facility];  Service: Vascular;  Laterality: Right;  . ULTRASOUND GUIDANCE FOR VASCULAR ACCESS  03/25/2018   Procedure: Ultrasound Guidance For Vascular Access;  Surgeon: Martinique, Peter M, MD;  Location: [facility];  Service: Cardiovascular;;   Past Medical History:  Diagnosis Date  . Amyloidosis ([place])   . Anemia of chronic disease   . Anxiety   . CAD (coronary artery disease)    a. cardiac cath on 11/25/17 with occlusion of the RCA which could have been his event but the RCA could not be engaged. The RCA filled distally from left to right collaterals. There were no severe stenoses in the left coronary  system. Medical therapy recommended, started on plavix.  . Chronic combined systolic and diastolic CHF (congestive heart failure) ([place])    a. EF dropped to 35-40% with grade 2 DD by echo 11/2017.  [name] DJD (degenerative joint disease), lumbar   . ESRD (end stage renal disease) ([place])    ESRD- HEMO MWF- [facility]  . GERD (gastroesophageal reflux disease)   . Hyperlipidemia   . Hypertension   . Myocardial infarction ([place])   . Neuropathy   . Peripheral vascular disease ([place])   . Pneumonia   . Restless legs   . S/P TAVR (transcatheter aortic valve replacement) 10/21/2018   26 mm Edwards Sapien 3 Ultra transcatheter heart valve placed via percutaneous right transfemoral approach   . Severe aortic stenosis    severe   BP (!) 153/56   Pulse 76   Temp 97.9 F (36.6 C)   Ht 5\' 10"  (1.778 m)   Wt 152 lb (68.9 kg)   SpO2 93%   BMI 21.81 kg/m   Opioid Risk Score:   Fall Risk Score:  `1  Depression screen PHQ 2/9  Depression screen [facility] 2/9 08/06/2019 07/13/2019 06/11/2019 04/22/2019 10/09/2018 09/11/2018 08/21/2018  Decreased Interest 0 0 0 0 0 1 1  Down, Depressed, Hopeless 0 1 0 1 0 1 1  PHQ - 2 Score 0 1 0 1 0 2 2  Altered sleeping - 2 0 - 3 0 2  Tired, decreased energy - 1 3 - 2 1 2   Change in appetite - 0 0 - 0 0 0  Feeling bad or failure about yourself  - 0 0 - 0 0 1  Trouble concentrating - 1 0 - 0 0 1  Moving slowly or fidgety/restless - 0 0 - 0 1 1  Suicidal thoughts - 0 0 - 0 0 0  PHQ-9 Score - 5 3 - 5 4 9   Difficult doing work/chores - Somewhat difficult - - Somewhat difficult Somewhat difficult Somewhat difficult  Some recent data might be hidden    Review of Systems  Constitutional: Negative.   HENT: Negative.   Eyes: Negative.   Respiratory: Negative.   Cardiovascular: Negative.   Gastrointestinal: Negative.   Endocrine: Negative.   Genitourinary: Negative.   Musculoskeletal: Positive for back pain and gait problem.       Spasms  Skin: Negative.     Allergic/Immunologic: Negative.   Neurological: Positive for tremors.       Tingling  Hematological: Negative.  Psychiatric/Behavioral: Negative.   All other systems reviewed and are negative.      Objective:   Physical Exam Constitutional: NAD. Vital signs reviewed. HENT: Normocephalic. Atraumatic. Musculoskeletal: No edema or tenderness in extremities LLE 2nd, 3rd digit amputation Skin: Healed right BKA with central invagination Neurological:  Alert Follows commands.  Motor: Right lower extremity: 4+/5 proximal distal, improving Left lower extremity: Hip flexion, knee extension 4-4+/5, ankle dorsiflexion 4+/5     Assessment & Plan:  Right-handed male with history of remote tobacco abuse end-stage renal disease due to cardiorenal syndrome in the setting of AL amyloidosis, aortic stenosis, hypertension CHF presents for follow up for debility secondary to fem-pop bypass complicated by NSTEMI.   1.Debilitysecondary to PAD status post left common femoral to below-knee popliteal artery bypass 2/87/6811 complicated by non-STEMI with subsequent LLE 2nd, 3rd digit amputation             Continue HEP             Continue follow up with Vasc - plans for ?vein replacement LLE, workup planned in the next week             Cont shrinker             Follow up with prosthesis for molding next week  2. Peripheral Neuropathy:             States no benefit with Gabapentin             Recommended trial lidocaine patch again, states he does not need at present             Oxycodone as needed per ?PCP, encouraged wean  3. End-stage renal disease.              Continues to have sig edema in LE             Recs per Nephro  4. Gait abnormality             Continue wheelchair for safety, will plan for therapies after prosthesis             See #1  5. Back spasms             Positional with prolonged sitting             Continue Tizanidine 2mg  daily PRN, Robaxin not covered by  insurance (using ~2/week)             Will consider trigger point injections

## 2019-08-06 NOTE — Progress Notes (Signed)
POST OPERATIVE OFFICE NOTE    CC:  F/u for surgery  HPI:  This is a 69 y.o. male who is s/p left common femoral to below knee popliteal artery bypass graft with PTFE and third toe amputation on 07/22/18 by Dr. Donzetta Matters. He additionally underwent a right fem to popliteal artery bypass on 04/10/19 and subsequently a right below knee amputation on 05/07/19 due to a poorly healing right transmetatarsal amputation.  I last saw him on 07/10/19 at which time he had a small scab on the right BKA stump incision line with tenderness. There was no drainage from this area. He otherwise did not have any major complaints. His left lower extremity bypass was patent on duplex and ABIs were stable. He was not having any left lower extremity claudication, rest pain or non healing wounds. His left 3rd toe amputation site remained healed.   He presents today for follow up of his right BKA. It is almost completely healed. He has no tenderness. There is pinpoint scab in the central incision line. He has been wearing his shrinker without issues. He has follow up with [facility] on 08/13/19.  No Known Allergies  Current Outpatient Medications  Medication Sig Dispense Refill  . acetaminophen (TYLENOL) 325 MG tablet Take 1-2 tablets (325-650 mg total) by mouth every 4 (four) hours as needed for mild pain (or temp >/= 101 F).    [name] amiodarone (PACERONE) 200 MG tablet Take 1 tablet (200 mg total) by mouth daily. 90 tablet 2  . atorvastatin (LIPITOR) 80 MG tablet Take 1 tablet (80 mg total) by mouth daily with supper. 30 tablet 0  . calcitRIOL (ROCALTROL) 0.5 MCG capsule Take 3 capsules (1.5 mcg total) by mouth every Monday, Wednesday, and Friday with hemodialysis. 30 capsule 0  . clopidogrel (PLAVIX) 75 MG tablet Take 1 tablet (75 mg total) by mouth daily with supper. 90 tablet 2  . Darbepoetin Alfa (ARANESP) 100 MCG/0.5ML SOSY injection Inject 0.5 mLs (100 mcg total) into the vein every Friday with hemodialysis. 4.2 mL   .  docusate sodium (COLACE) 100 MG capsule Take 1 capsule (100 mg total) by mouth 2 (two) times daily. 10 capsule 0  . LORazepam (ATIVAN) 1 MG tablet Take 1 tablet (1 mg total) by mouth 2 (two) times daily as needed for anxiety. 20 tablet 0  . Melatonin 3 MG TABS Take 0.5 tablets (1.5 mg total) by mouth at bedtime. 30 tablet 0  . midodrine (PROAMATINE) 10 MG tablet Take 1 tablet (10 mg total) by mouth every Monday, Wednesday, and Friday with hemodialysis. 30 tablet 0  . multivitamin (RENA-VIT) TABS tablet Take 1 tablet by mouth at bedtime. (Patient taking differently: Take 1 tablet by mouth daily with breakfast. ) 30 tablet 0  . pantoprazole (PROTONIX) 40 MG tablet Take 1 tablet (40 mg total) by mouth daily. 30 tablet 0  . sucroferric oxyhydroxide (VELPHORO) 500 MG chewable tablet Chew 3 tablets (1,500 mg total) by mouth 3 (three) times daily with meals. 120 tablet 0  . tiZANidine (ZANAFLEX) 2 MG tablet Take 2 mg by mouth once. One tablet by mouth daily as needed for muscle spasms     . Vancomycin (VANCOCIN) 750-5 MG/150ML-% SOLN Inject 150 mLs (750 mg total) into the vein every Monday, Wednesday, and Friday with hemodialysis. 4000 mL 0  . vitamin B-12 (CYANOCOBALAMIN) 1000 MCG tablet Take 1 tablet (1,000 mcg total) by mouth every evening. 30 tablet 0  . oxyCODONE (OXY IR/ROXICODONE) 5 MG immediate release tablet  Take 1-2 tablets (5-10 mg total) by mouth every 4 (four) hours as needed for moderate pain. (Patient not taking: Reported on 08/06/2019) 30 tablet 0   No current facility-administered medications for this visit.     ROS:  See HPI  Physical Exam:  General: appears fatigued but not in any discomfort, well nourished Lungs: clear to auscultation bilaterally, non labored Extremities:  Well perfused and warm, right BKA Site almost completely healed. No drainage or tenderness. Small pinpoint eschar in the mid portion of incision line. No erythema. 2+ femoral pulses bilaterally, left foot warm. 2+  PT pulse. Left 3rd toe healed Neuro: alert and oriented Abdomen:  Soft, non tender  Assessment/Plan:  This is a 69 y.o. male who is s/p left Left common femoral to below knee popliteal bypass with third toe amputation 07/22/18 by Dr. Donzetta Matters and right BKA 05/07/19 by Dr. Trula Slade.  He also has a right UE AV fistula. Fistula functioning well without any steal signs or symptoms. Left lower extremity well perfused with patent bypass graft. Palpable PT pulse in left foot and 3rd toe amputation site remains well healed. Right BKA stump with small pinpoint scab almost completely healed. He can keep follow up with Prosthetists next week. - he will continue his aspirin, Plavix and statin - I have advised him that if he develops rest pain, claudication, non healing wounds he needs to call for earlier follow up - He can follow up as needed for his right AV fistula - He will have a duplex of his left fem-popliteal bypass and ABI's in 6 months  [facility] [phone]  Clinic MD:  Dr. Oneida Alar

## 2019-08-07 ENCOUNTER — Other Ambulatory Visit: Payer: Self-pay | Admitting: *Deleted

## 2019-08-07 DIAGNOSIS — D631 Anemia in chronic kidney disease: Secondary | ICD-10-CM | POA: Diagnosis not present

## 2019-08-07 DIAGNOSIS — Z992 Dependence on renal dialysis: Secondary | ICD-10-CM | POA: Diagnosis not present

## 2019-08-07 DIAGNOSIS — D688 Other specified coagulation defects: Secondary | ICD-10-CM | POA: Diagnosis not present

## 2019-08-07 DIAGNOSIS — N2581 Secondary hyperparathyroidism of renal origin: Secondary | ICD-10-CM | POA: Diagnosis not present

## 2019-08-07 DIAGNOSIS — N186 End stage renal disease: Secondary | ICD-10-CM | POA: Diagnosis not present

## 2019-08-07 DIAGNOSIS — I739 Peripheral vascular disease, unspecified: Secondary | ICD-10-CM

## 2019-08-07 DIAGNOSIS — D509 Iron deficiency anemia, unspecified: Secondary | ICD-10-CM | POA: Diagnosis not present

## 2019-08-10 DIAGNOSIS — Z992 Dependence on renal dialysis: Secondary | ICD-10-CM | POA: Diagnosis not present

## 2019-08-10 DIAGNOSIS — D631 Anemia in chronic kidney disease: Secondary | ICD-10-CM | POA: Diagnosis not present

## 2019-08-10 DIAGNOSIS — N2581 Secondary hyperparathyroidism of renal origin: Secondary | ICD-10-CM | POA: Diagnosis not present

## 2019-08-10 DIAGNOSIS — D688 Other specified coagulation defects: Secondary | ICD-10-CM | POA: Diagnosis not present

## 2019-08-10 DIAGNOSIS — I129 Hypertensive chronic kidney disease with stage 1 through stage 4 chronic kidney disease, or unspecified chronic kidney disease: Secondary | ICD-10-CM | POA: Diagnosis not present

## 2019-08-10 DIAGNOSIS — N186 End stage renal disease: Secondary | ICD-10-CM | POA: Diagnosis not present

## 2019-08-10 DIAGNOSIS — D509 Iron deficiency anemia, unspecified: Secondary | ICD-10-CM | POA: Diagnosis not present

## 2019-08-12 DIAGNOSIS — D509 Iron deficiency anemia, unspecified: Secondary | ICD-10-CM | POA: Diagnosis not present

## 2019-08-12 DIAGNOSIS — N186 End stage renal disease: Secondary | ICD-10-CM | POA: Diagnosis not present

## 2019-08-12 DIAGNOSIS — D688 Other specified coagulation defects: Secondary | ICD-10-CM | POA: Diagnosis not present

## 2019-08-12 DIAGNOSIS — N2581 Secondary hyperparathyroidism of renal origin: Secondary | ICD-10-CM | POA: Diagnosis not present

## 2019-08-12 DIAGNOSIS — Z992 Dependence on renal dialysis: Secondary | ICD-10-CM | POA: Diagnosis not present

## 2019-08-13 DIAGNOSIS — I132 Hypertensive heart and chronic kidney disease with heart failure and with stage 5 chronic kidney disease, or end stage renal disease: Secondary | ICD-10-CM | POA: Diagnosis not present

## 2019-08-13 DIAGNOSIS — Z4781 Encounter for orthopedic aftercare following surgical amputation: Secondary | ICD-10-CM | POA: Diagnosis not present

## 2019-08-13 DIAGNOSIS — D631 Anemia in chronic kidney disease: Secondary | ICD-10-CM | POA: Diagnosis not present

## 2019-08-13 DIAGNOSIS — I255 Ischemic cardiomyopathy: Secondary | ICD-10-CM | POA: Diagnosis not present

## 2019-08-13 DIAGNOSIS — E859 Amyloidosis, unspecified: Secondary | ICD-10-CM | POA: Diagnosis not present

## 2019-08-13 DIAGNOSIS — M5136 Other intervertebral disc degeneration, lumbar region: Secondary | ICD-10-CM | POA: Diagnosis not present

## 2019-08-13 DIAGNOSIS — N2581 Secondary hyperparathyroidism of renal origin: Secondary | ICD-10-CM | POA: Diagnosis not present

## 2019-08-13 DIAGNOSIS — I5042 Chronic combined systolic (congestive) and diastolic (congestive) heart failure: Secondary | ICD-10-CM | POA: Diagnosis not present

## 2019-08-13 DIAGNOSIS — I70221 Atherosclerosis of native arteries of extremities with rest pain, right leg: Secondary | ICD-10-CM | POA: Diagnosis not present

## 2019-08-13 DIAGNOSIS — I2510 Atherosclerotic heart disease of native coronary artery without angina pectoris: Secondary | ICD-10-CM | POA: Diagnosis not present

## 2019-08-13 DIAGNOSIS — I350 Nonrheumatic aortic (valve) stenosis: Secondary | ICD-10-CM | POA: Diagnosis not present

## 2019-08-13 DIAGNOSIS — I951 Orthostatic hypotension: Secondary | ICD-10-CM | POA: Diagnosis not present

## 2019-08-13 DIAGNOSIS — N186 End stage renal disease: Secondary | ICD-10-CM | POA: Diagnosis not present

## 2019-08-14 DIAGNOSIS — N2581 Secondary hyperparathyroidism of renal origin: Secondary | ICD-10-CM | POA: Diagnosis not present

## 2019-08-14 DIAGNOSIS — D509 Iron deficiency anemia, unspecified: Secondary | ICD-10-CM | POA: Diagnosis not present

## 2019-08-14 DIAGNOSIS — N186 End stage renal disease: Secondary | ICD-10-CM | POA: Diagnosis not present

## 2019-08-14 DIAGNOSIS — Z992 Dependence on renal dialysis: Secondary | ICD-10-CM | POA: Diagnosis not present

## 2019-08-14 DIAGNOSIS — D688 Other specified coagulation defects: Secondary | ICD-10-CM | POA: Diagnosis not present

## 2019-08-15 DIAGNOSIS — I739 Peripheral vascular disease, unspecified: Secondary | ICD-10-CM | POA: Diagnosis not present

## 2019-08-17 DIAGNOSIS — D688 Other specified coagulation defects: Secondary | ICD-10-CM | POA: Diagnosis not present

## 2019-08-17 DIAGNOSIS — Z992 Dependence on renal dialysis: Secondary | ICD-10-CM | POA: Diagnosis not present

## 2019-08-17 DIAGNOSIS — N2581 Secondary hyperparathyroidism of renal origin: Secondary | ICD-10-CM | POA: Diagnosis not present

## 2019-08-17 DIAGNOSIS — N186 End stage renal disease: Secondary | ICD-10-CM | POA: Diagnosis not present

## 2019-08-17 DIAGNOSIS — D509 Iron deficiency anemia, unspecified: Secondary | ICD-10-CM | POA: Diagnosis not present

## 2019-08-19 DIAGNOSIS — D688 Other specified coagulation defects: Secondary | ICD-10-CM | POA: Diagnosis not present

## 2019-08-19 DIAGNOSIS — N2581 Secondary hyperparathyroidism of renal origin: Secondary | ICD-10-CM | POA: Diagnosis not present

## 2019-08-19 DIAGNOSIS — Z992 Dependence on renal dialysis: Secondary | ICD-10-CM | POA: Diagnosis not present

## 2019-08-19 DIAGNOSIS — N186 End stage renal disease: Secondary | ICD-10-CM | POA: Diagnosis not present

## 2019-08-19 DIAGNOSIS — D509 Iron deficiency anemia, unspecified: Secondary | ICD-10-CM | POA: Diagnosis not present

## 2019-08-21 DIAGNOSIS — N186 End stage renal disease: Secondary | ICD-10-CM | POA: Diagnosis not present

## 2019-08-21 DIAGNOSIS — Z992 Dependence on renal dialysis: Secondary | ICD-10-CM | POA: Diagnosis not present

## 2019-08-21 DIAGNOSIS — N2581 Secondary hyperparathyroidism of renal origin: Secondary | ICD-10-CM | POA: Diagnosis not present

## 2019-08-21 DIAGNOSIS — D509 Iron deficiency anemia, unspecified: Secondary | ICD-10-CM | POA: Diagnosis not present

## 2019-08-21 DIAGNOSIS — D688 Other specified coagulation defects: Secondary | ICD-10-CM | POA: Diagnosis not present

## 2019-08-24 DIAGNOSIS — N186 End stage renal disease: Secondary | ICD-10-CM | POA: Diagnosis not present

## 2019-08-24 DIAGNOSIS — N2581 Secondary hyperparathyroidism of renal origin: Secondary | ICD-10-CM | POA: Diagnosis not present

## 2019-08-24 DIAGNOSIS — Z992 Dependence on renal dialysis: Secondary | ICD-10-CM | POA: Diagnosis not present

## 2019-08-24 DIAGNOSIS — D688 Other specified coagulation defects: Secondary | ICD-10-CM | POA: Diagnosis not present

## 2019-08-24 DIAGNOSIS — D509 Iron deficiency anemia, unspecified: Secondary | ICD-10-CM | POA: Diagnosis not present

## 2019-08-26 DIAGNOSIS — D509 Iron deficiency anemia, unspecified: Secondary | ICD-10-CM | POA: Diagnosis not present

## 2019-08-26 DIAGNOSIS — N186 End stage renal disease: Secondary | ICD-10-CM | POA: Diagnosis not present

## 2019-08-26 DIAGNOSIS — Z992 Dependence on renal dialysis: Secondary | ICD-10-CM | POA: Diagnosis not present

## 2019-08-26 DIAGNOSIS — D688 Other specified coagulation defects: Secondary | ICD-10-CM | POA: Diagnosis not present

## 2019-08-26 DIAGNOSIS — N2581 Secondary hyperparathyroidism of renal origin: Secondary | ICD-10-CM | POA: Diagnosis not present

## 2019-08-26 IMAGING — CT CT HEAR MORPH WITH CTA COR WITH SCORE WITH CA WITH CONTRAST AND
2 of 10 series · 8 of 20 positions shown, 9 images · IV contrast (APPLIED)
Comparison: None.

Addendum:
CLINICAL DATA: 68-year-old male with severe aortic stenosis being
evaluated for a TAVR procedure.

EXAM:
Cardiac TAVR CT
TECHNIQUE: The patient was scanned on a Phillips Force scanner. A 120 kV
retrospective scan was triggered in the descending thoracic aorta at
111 HU's. Gantry rotation speed was 250 msecs and collimation was .6
mm. No beta blockade or nitro were given. The 3D data set was
reconstructed in 5% intervals of the R-R cycle. Systolic and
diastolic phases were analyzed on a dedicated work station using
MPR, MIP and VRT modes. The patient received 80 cc of contrast.

[Series 9: 0-90% · axial · 0.55mm/px · z∈[+1216,+1410]mm · 4 of 5390 slices shown, 5 images]
[im 1078/5390  vessel]
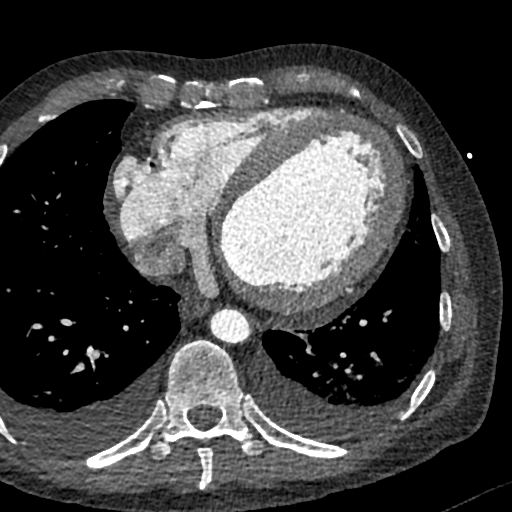
[im 1078/5390  lung]
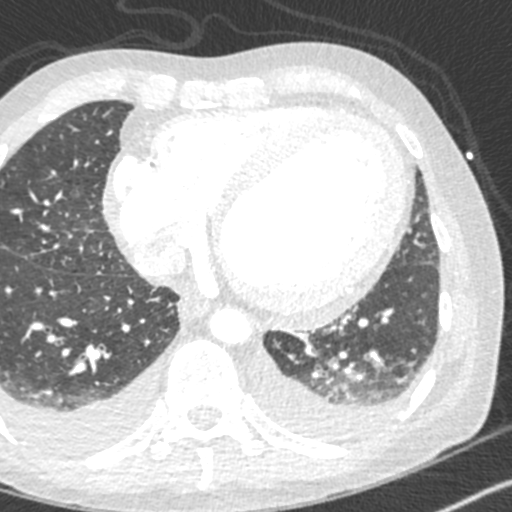
[im 2156/5390  vessel]
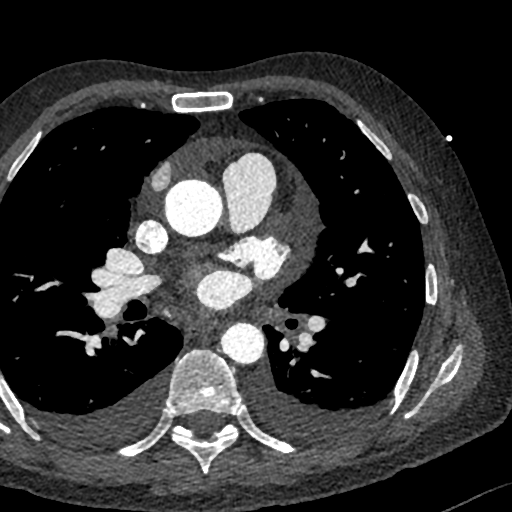
[im 3234/5390  vessel]
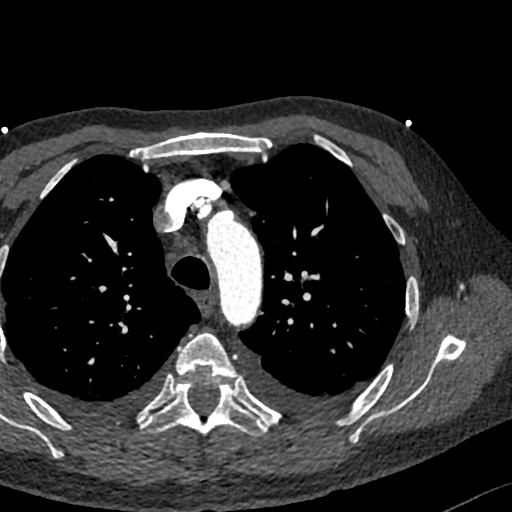
[im 4312/5390  vessel]
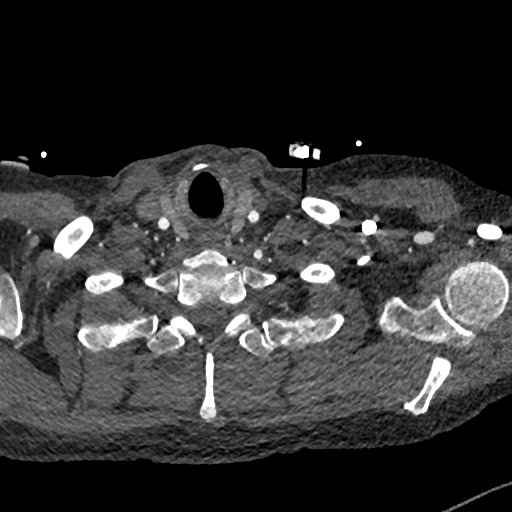

[Series 10: 5-95% · axial · 0.55mm/px · z∈[+1216,+1410]mm · 4 of 5390 slices shown]
[im 1078/5390  vessel]
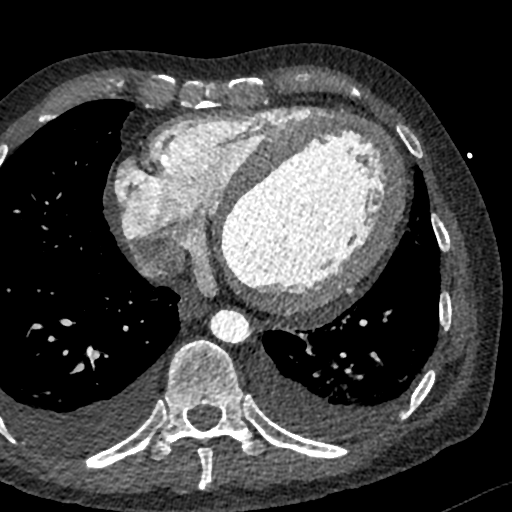
[im 2156/5390  vessel]
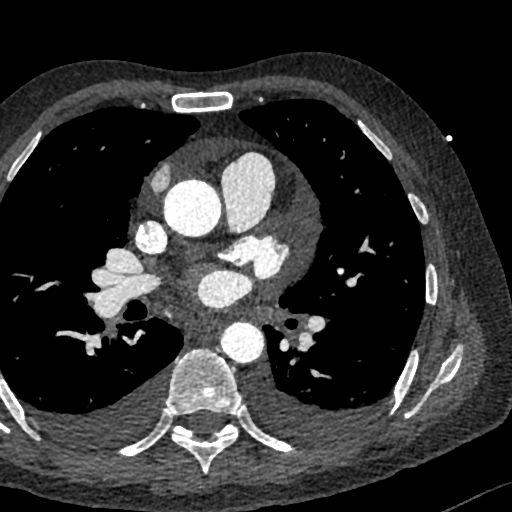
[im 3234/5390  vessel]
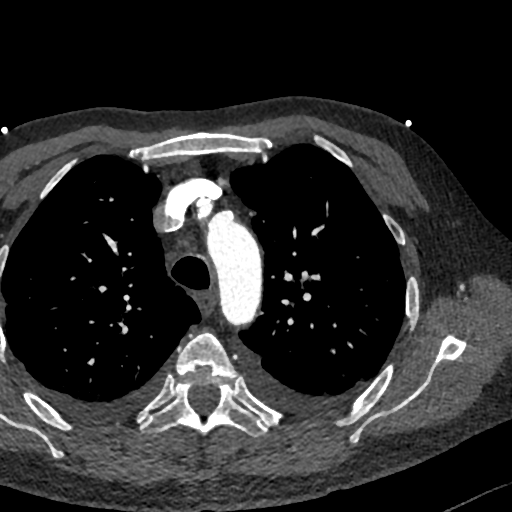
[im 4312/5390  vessel]
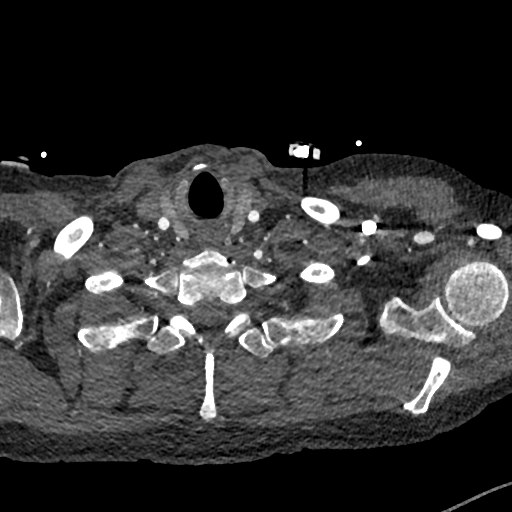

[8 of 20 positions shown; findings below may reference images not displayed]

FINDINGS: Aortic Valve: Trileaflet aortic valve with severely thickened and
calcified leaflets with severely restricted leaflet opening and no
calcifications extending into the LVOT.

Aorta: Normal size, mild diffuse atherosclerotic plaque and
calcifications and no dissection.

Sinotubular Junction: 31 x 29 mm

Ascending Thoracic Aorta: 31 x 30 mm

Aortic Arch: 28 x 26 mm

Descending Thoracic Aorta: 23 x 22 mm

Sinus of Valsalva Measurements:

Non-coronary: 34 mm

Right -coronary: 33 mm

Left -coronary: 35 mm

Coronary Artery Height above Annulus:

Left Main: 12 mm

Right Coronary: 19 mm (RCA occluded)

Virtual Basal Annulus Measurements:

Maximum/Minimum Diameter: 28.6 x 24.1 mm

Mean Diameter: 25.4 mm

Perimeter: 81.5 mm

Area: 508 mm2

Optimum Fluoroscopic Angle for Delivery: LAO 6 BELANDRES 7.
IMPRESSION: 1. Trileaflet aortic valve with severely thickened and calcified
leaflets with severely restricted leaflet opening and no
calcifications extending into the LVOT. Aortic valve calcium score
1029 consistent with severe aortic stenosis. Annular measurements
suitable for delivery of a 26 mm Edwards-SAPIEN 3 valve.

2. Sufficient coronary to annulus distance.

3. Optimum Fluoroscopic Angle for Delivery:  LAO 6 BELANDRES 7.

4. No thrombus in the left atrial appendage.

EXAM:
OVER-READ INTERPRETATION  CT CHEST

The following report is an over-read performed by radiologist Dr.
over-read does not include interpretation of cardiac or coronary
anatomy or pathology. The coronary calcium score or cardiac CTA
interpretation by the cardiologist is attached.
FINDINGS: Extracardiac findings will be described under separate dictation for
contemporaneously obtained CTA chest, abdomen and pelvis.
IMPRESSION: Please see separate dictation for contemporaneously obtained CTA
chest, abdomen and pelvis dated 09/30/2018 for full description of
relevant extracardiac findings.

*** End of Addendum ***
FINDINGS: Aortic Valve: Trileaflet aortic valve with severely thickened and
calcified leaflets with severely restricted leaflet opening and no
calcifications extending into the LVOT.

Aorta: Normal size, mild diffuse atherosclerotic plaque and
calcifications and no dissection.

Sinotubular Junction: 31 x 29 mm

Ascending Thoracic Aorta: 31 x 30 mm

Aortic Arch: 28 x 26 mm

Descending Thoracic Aorta: 23 x 22 mm

Sinus of Valsalva Measurements:

Non-coronary: 34 mm

Right -coronary: 33 mm

Left -coronary: 35 mm

Coronary Artery Height above Annulus:

Left Main: 12 mm

Right Coronary: 19 mm (RCA occluded)

Virtual Basal Annulus Measurements:

Maximum/Minimum Diameter: 28.6 x 24.1 mm

Mean Diameter: 25.4 mm

Perimeter: 81.5 mm

Area: 508 mm2

Optimum Fluoroscopic Angle for Delivery: LAO 6 BELANDRES 7.
IMPRESSION: 1. Trileaflet aortic valve with severely thickened and calcified
leaflets with severely restricted leaflet opening and no
calcifications extending into the LVOT. Aortic valve calcium score
1029 consistent with severe aortic stenosis. Annular measurements
suitable for delivery of a 26 mm Edwards-SAPIEN 3 valve.

2. Sufficient coronary to annulus distance.

3. Optimum Fluoroscopic Angle for Delivery:  LAO 6 BELANDRES 7.

4. No thrombus in the left atrial appendage.

## 2019-08-28 DIAGNOSIS — D688 Other specified coagulation defects: Secondary | ICD-10-CM | POA: Diagnosis not present

## 2019-08-28 DIAGNOSIS — D509 Iron deficiency anemia, unspecified: Secondary | ICD-10-CM | POA: Diagnosis not present

## 2019-08-28 DIAGNOSIS — Z992 Dependence on renal dialysis: Secondary | ICD-10-CM | POA: Diagnosis not present

## 2019-08-28 DIAGNOSIS — N186 End stage renal disease: Secondary | ICD-10-CM | POA: Diagnosis not present

## 2019-08-28 DIAGNOSIS — N2581 Secondary hyperparathyroidism of renal origin: Secondary | ICD-10-CM | POA: Diagnosis not present

## 2019-08-31 DIAGNOSIS — N186 End stage renal disease: Secondary | ICD-10-CM | POA: Diagnosis not present

## 2019-08-31 DIAGNOSIS — Z992 Dependence on renal dialysis: Secondary | ICD-10-CM | POA: Diagnosis not present

## 2019-08-31 DIAGNOSIS — D509 Iron deficiency anemia, unspecified: Secondary | ICD-10-CM | POA: Diagnosis not present

## 2019-08-31 DIAGNOSIS — D688 Other specified coagulation defects: Secondary | ICD-10-CM | POA: Diagnosis not present

## 2019-08-31 DIAGNOSIS — N2581 Secondary hyperparathyroidism of renal origin: Secondary | ICD-10-CM | POA: Diagnosis not present

## 2019-09-02 DIAGNOSIS — D509 Iron deficiency anemia, unspecified: Secondary | ICD-10-CM | POA: Diagnosis not present

## 2019-09-02 DIAGNOSIS — D688 Other specified coagulation defects: Secondary | ICD-10-CM | POA: Diagnosis not present

## 2019-09-02 DIAGNOSIS — N186 End stage renal disease: Secondary | ICD-10-CM | POA: Diagnosis not present

## 2019-09-02 DIAGNOSIS — Z992 Dependence on renal dialysis: Secondary | ICD-10-CM | POA: Diagnosis not present

## 2019-09-02 DIAGNOSIS — N2581 Secondary hyperparathyroidism of renal origin: Secondary | ICD-10-CM | POA: Diagnosis not present

## 2019-09-03 DIAGNOSIS — Z89511 Acquired absence of right leg below knee: Secondary | ICD-10-CM | POA: Diagnosis not present

## 2019-09-04 DIAGNOSIS — Z992 Dependence on renal dialysis: Secondary | ICD-10-CM | POA: Diagnosis not present

## 2019-09-04 DIAGNOSIS — D509 Iron deficiency anemia, unspecified: Secondary | ICD-10-CM | POA: Diagnosis not present

## 2019-09-04 DIAGNOSIS — D688 Other specified coagulation defects: Secondary | ICD-10-CM | POA: Diagnosis not present

## 2019-09-04 DIAGNOSIS — N2581 Secondary hyperparathyroidism of renal origin: Secondary | ICD-10-CM | POA: Diagnosis not present

## 2019-09-04 DIAGNOSIS — N186 End stage renal disease: Secondary | ICD-10-CM | POA: Diagnosis not present

## 2019-09-07 DIAGNOSIS — N2581 Secondary hyperparathyroidism of renal origin: Secondary | ICD-10-CM | POA: Diagnosis not present

## 2019-09-07 DIAGNOSIS — D688 Other specified coagulation defects: Secondary | ICD-10-CM | POA: Diagnosis not present

## 2019-09-07 DIAGNOSIS — Z992 Dependence on renal dialysis: Secondary | ICD-10-CM | POA: Diagnosis not present

## 2019-09-07 DIAGNOSIS — N186 End stage renal disease: Secondary | ICD-10-CM | POA: Diagnosis not present

## 2019-09-07 DIAGNOSIS — D509 Iron deficiency anemia, unspecified: Secondary | ICD-10-CM | POA: Diagnosis not present

## 2019-09-09 DIAGNOSIS — D688 Other specified coagulation defects: Secondary | ICD-10-CM | POA: Diagnosis not present

## 2019-09-09 DIAGNOSIS — N2581 Secondary hyperparathyroidism of renal origin: Secondary | ICD-10-CM | POA: Diagnosis not present

## 2019-09-09 DIAGNOSIS — D509 Iron deficiency anemia, unspecified: Secondary | ICD-10-CM | POA: Diagnosis not present

## 2019-09-09 DIAGNOSIS — I129 Hypertensive chronic kidney disease with stage 1 through stage 4 chronic kidney disease, or unspecified chronic kidney disease: Secondary | ICD-10-CM | POA: Diagnosis not present

## 2019-09-09 DIAGNOSIS — N186 End stage renal disease: Secondary | ICD-10-CM | POA: Diagnosis not present

## 2019-09-09 DIAGNOSIS — Z992 Dependence on renal dialysis: Secondary | ICD-10-CM | POA: Diagnosis not present

## 2019-09-11 ENCOUNTER — Other Ambulatory Visit (HOSPITAL_COMMUNITY): Payer: Self-pay | Admitting: Nephrology

## 2019-09-11 DIAGNOSIS — D631 Anemia in chronic kidney disease: Secondary | ICD-10-CM | POA: Diagnosis not present

## 2019-09-11 DIAGNOSIS — D688 Other specified coagulation defects: Secondary | ICD-10-CM | POA: Diagnosis not present

## 2019-09-11 DIAGNOSIS — N186 End stage renal disease: Secondary | ICD-10-CM | POA: Diagnosis not present

## 2019-09-11 DIAGNOSIS — D509 Iron deficiency anemia, unspecified: Secondary | ICD-10-CM | POA: Diagnosis not present

## 2019-09-11 DIAGNOSIS — N2581 Secondary hyperparathyroidism of renal origin: Secondary | ICD-10-CM | POA: Diagnosis not present

## 2019-09-11 DIAGNOSIS — Z992 Dependence on renal dialysis: Secondary | ICD-10-CM | POA: Diagnosis not present

## 2019-09-14 DIAGNOSIS — N2581 Secondary hyperparathyroidism of renal origin: Secondary | ICD-10-CM | POA: Diagnosis not present

## 2019-09-14 DIAGNOSIS — D631 Anemia in chronic kidney disease: Secondary | ICD-10-CM | POA: Diagnosis not present

## 2019-09-14 DIAGNOSIS — N186 End stage renal disease: Secondary | ICD-10-CM | POA: Diagnosis not present

## 2019-09-14 DIAGNOSIS — D688 Other specified coagulation defects: Secondary | ICD-10-CM | POA: Diagnosis not present

## 2019-09-14 DIAGNOSIS — I739 Peripheral vascular disease, unspecified: Secondary | ICD-10-CM | POA: Diagnosis not present

## 2019-09-14 DIAGNOSIS — Z992 Dependence on renal dialysis: Secondary | ICD-10-CM | POA: Diagnosis not present

## 2019-09-14 DIAGNOSIS — D509 Iron deficiency anemia, unspecified: Secondary | ICD-10-CM | POA: Diagnosis not present

## 2019-09-16 DIAGNOSIS — D631 Anemia in chronic kidney disease: Secondary | ICD-10-CM | POA: Diagnosis not present

## 2019-09-16 DIAGNOSIS — N2581 Secondary hyperparathyroidism of renal origin: Secondary | ICD-10-CM | POA: Diagnosis not present

## 2019-09-16 DIAGNOSIS — D509 Iron deficiency anemia, unspecified: Secondary | ICD-10-CM | POA: Diagnosis not present

## 2019-09-16 DIAGNOSIS — Z992 Dependence on renal dialysis: Secondary | ICD-10-CM | POA: Diagnosis not present

## 2019-09-16 DIAGNOSIS — D688 Other specified coagulation defects: Secondary | ICD-10-CM | POA: Diagnosis not present

## 2019-09-16 DIAGNOSIS — N186 End stage renal disease: Secondary | ICD-10-CM | POA: Diagnosis not present

## 2019-09-18 ENCOUNTER — Other Ambulatory Visit: Payer: Self-pay | Admitting: Cardiovascular Disease

## 2019-09-18 ENCOUNTER — Other Ambulatory Visit: Payer: Self-pay | Admitting: Family Medicine

## 2019-09-18 DIAGNOSIS — Z992 Dependence on renal dialysis: Secondary | ICD-10-CM | POA: Diagnosis not present

## 2019-09-18 DIAGNOSIS — D688 Other specified coagulation defects: Secondary | ICD-10-CM | POA: Diagnosis not present

## 2019-09-18 DIAGNOSIS — N186 End stage renal disease: Secondary | ICD-10-CM | POA: Diagnosis not present

## 2019-09-18 DIAGNOSIS — N2581 Secondary hyperparathyroidism of renal origin: Secondary | ICD-10-CM | POA: Diagnosis not present

## 2019-09-18 DIAGNOSIS — D509 Iron deficiency anemia, unspecified: Secondary | ICD-10-CM | POA: Diagnosis not present

## 2019-09-18 DIAGNOSIS — D631 Anemia in chronic kidney disease: Secondary | ICD-10-CM | POA: Diagnosis not present

## 2019-09-18 NOTE — Telephone Encounter (Signed)
Requested medication (s) are due for refill today - yes  Requested medication (s) are on the active medication list -yes  Future visit scheduled -no  Last refill: 05/27/19  Notes to clinic: Request for Rx prescribed by outside provider- fails lab protocol. Sent for review of request  Requested Prescriptions  Pending Prescriptions Disp Refills   atorvastatin (LIPITOR) 80 MG tablet [Pharmacy Med Name: ATORVASTATIN 80 MG TABLET 80 Tablet] 30 tablet 0    Sig: Take 1 tablet (80 mg total) by mouth daily with supper.      Cardiovascular:  Antilipid - Statins Failed - 09/18/2019  3:56 PM      Failed - Total Cholesterol in normal range and within 360 days    Cholesterol, Total  Date Value Ref Range Status  02/12/2018 117 100 - 199 mg/dL Final          Failed - LDL in normal range and within 360 days    LDL Calculated  Date Value Ref Range Status  02/12/2018 50 0 - 99 mg/dL Final          Failed - HDL in normal range and within 360 days    HDL  Date Value Ref Range Status  02/12/2018 32 (L) >39 mg/dL Final          Failed - Triglycerides in normal range and within 360 days    Triglycerides  Date Value Ref Range Status  02/12/2018 174 (H) 0 - 149 mg/dL Final          Passed - Patient is not pregnant      Passed - Valid encounter within last 12 months    Recent Outpatient Visits           2 months ago Finger pain, left   [place] Fulp, [place], MD   4 months ago Cellulitis of right lower leg   [place] Fulp, [place], MD   1 year ago Peripheral arterial disease ([place])   [place] Fulp, [place], MD   1 year ago Left foot infection   [place], MD   4 years ago Essential hypertension, benign   [place], Adriana Mccallum, MD       Future Appointments             In 2 weeks [place], [place],  PA-C [facility], [facility]   In 2 weeks [place] Hampshire, MD [facility] [place], [facility]   In 1 month [place], [place] R, PA-C [place], [facility]                Requested Prescriptions  Pending Prescriptions Disp Refills   atorvastatin (LIPITOR) 80 MG tablet [Pharmacy Med Name: ATORVASTATIN 80 MG TABLET 80 Tablet] 30 tablet 0    Sig: Take 1 tablet (80 mg total) by mouth daily with supper.      Cardiovascular:  Antilipid - Statins Failed - 09/18/2019  3:56 PM      Failed - Total Cholesterol in normal range and within 360 days    Cholesterol, Total  Date Value Ref Range Status  02/12/2018 117 100 - 199 mg/dL Final          Failed - LDL in normal range and within 360 days    LDL Calculated  Date Value Ref Range Status  02/12/2018 50 0 - 99 mg/dL Final  Failed - HDL in normal range and within 360 days    HDL  Date Value Ref Range Status  02/12/2018 32 (L) >39 mg/dL Final          Failed - Triglycerides in normal range and within 360 days    Triglycerides  Date Value Ref Range Status  02/12/2018 174 (H) 0 - 149 mg/dL Final          Passed - Patient is not pregnant      Passed - Valid encounter within last 12 months    Recent Outpatient Visits           2 months ago Finger pain, left   [place] Fulp, [place], MD   4 months ago Cellulitis of right lower leg   [place], MD   1 year ago Peripheral arterial disease ([place])   [place] Fulp, [place], MD   1 year ago Left foot infection   [place], MD   4 years ago Essential hypertension, benign   [place] [name], MD       Future Appointments             In 2 weeks [place], [place], [place] [facility], [facility]   In 2 weeks [place] Hampshire, MD [facility] [place], [place]   In 1 month [place], [place] Ganja, PA-C [place], [facility]

## 2019-09-21 DIAGNOSIS — D631 Anemia in chronic kidney disease: Secondary | ICD-10-CM | POA: Diagnosis not present

## 2019-09-21 DIAGNOSIS — N186 End stage renal disease: Secondary | ICD-10-CM | POA: Diagnosis not present

## 2019-09-21 DIAGNOSIS — D509 Iron deficiency anemia, unspecified: Secondary | ICD-10-CM | POA: Diagnosis not present

## 2019-09-21 DIAGNOSIS — D688 Other specified coagulation defects: Secondary | ICD-10-CM | POA: Diagnosis not present

## 2019-09-21 DIAGNOSIS — Z992 Dependence on renal dialysis: Secondary | ICD-10-CM | POA: Diagnosis not present

## 2019-09-21 DIAGNOSIS — N2581 Secondary hyperparathyroidism of renal origin: Secondary | ICD-10-CM | POA: Diagnosis not present

## 2019-09-22 ENCOUNTER — Other Ambulatory Visit: Payer: Self-pay

## 2019-09-22 ENCOUNTER — Ambulatory Visit: Payer: Medicare Other | Admitting: Physical Therapy

## 2019-09-22 ENCOUNTER — Encounter: Payer: Self-pay | Admitting: Physical Therapy

## 2019-09-22 DIAGNOSIS — M6281 Muscle weakness (generalized): Secondary | ICD-10-CM

## 2019-09-22 DIAGNOSIS — M25662 Stiffness of left knee, not elsewhere classified: Secondary | ICD-10-CM

## 2019-09-22 DIAGNOSIS — Z9181 History of falling: Secondary | ICD-10-CM

## 2019-09-22 DIAGNOSIS — M25661 Stiffness of right knee, not elsewhere classified: Secondary | ICD-10-CM

## 2019-09-22 DIAGNOSIS — R2681 Unsteadiness on feet: Secondary | ICD-10-CM

## 2019-09-22 DIAGNOSIS — R293 Abnormal posture: Secondary | ICD-10-CM | POA: Diagnosis not present

## 2019-09-22 DIAGNOSIS — R2689 Other abnormalities of gait and mobility: Secondary | ICD-10-CM

## 2019-09-23 DIAGNOSIS — D509 Iron deficiency anemia, unspecified: Secondary | ICD-10-CM | POA: Diagnosis not present

## 2019-09-23 DIAGNOSIS — Z992 Dependence on renal dialysis: Secondary | ICD-10-CM | POA: Diagnosis not present

## 2019-09-23 DIAGNOSIS — N2581 Secondary hyperparathyroidism of renal origin: Secondary | ICD-10-CM | POA: Diagnosis not present

## 2019-09-23 DIAGNOSIS — D688 Other specified coagulation defects: Secondary | ICD-10-CM | POA: Diagnosis not present

## 2019-09-23 DIAGNOSIS — D631 Anemia in chronic kidney disease: Secondary | ICD-10-CM | POA: Diagnosis not present

## 2019-09-23 DIAGNOSIS — N186 End stage renal disease: Secondary | ICD-10-CM | POA: Diagnosis not present

## 2019-09-23 NOTE — Therapy (Signed)
[facility] Physical Therapy [address] [place], [place], [address] Phone: [phone]   Fax:  [phone]  Physical Therapy Evaluation  Patient Details  Name: Jose Garrett MRN: [id number] Date of Birth: 05-09-1950 Referring Provider (PT): Curt Jews, MD   Encounter Date: 09/22/2019   PT End of Session - 09/22/19 1624    Visit Number 1    Number of Visits 25    Date for PT Re-Evaluation 12/21/19    Authorization Type UHC Medicare    Authorization Time Period $30 co-pay    PT Start Time 1520    PT Stop Time 1615    PT Time Calculation (min) 55 min    Equipment Utilized During Treatment Gait belt    Activity Tolerance Patient tolerated treatment well    Behavior During Therapy [facility] for tasks assessed/performed           Past Medical History:  Diagnosis Date  . Amyloidosis ([place])   . Anemia of chronic disease   . Anxiety   . CAD (coronary artery disease)    a. cardiac cath on 11/25/17 with occlusion of the RCA which could have been his event but the RCA could not be engaged. The RCA filled distally from left to right collaterals. There were no severe stenoses in the left coronary system. Medical therapy recommended, started on plavix.  . Chronic combined systolic and diastolic CHF (congestive heart failure) ([place])    a. EF dropped to 35-40% with grade 2 DD by echo 11/2017.  [name] DJD (degenerative joint disease), lumbar   . ESRD (end stage renal disease) ([place])    ESRD- HEMO MWF- [employer]  . GERD (gastroesophageal reflux disease)   . Hyperlipidemia   . Hypertension   . Myocardial infarction ([place])   . Neuropathy   . Peripheral vascular disease ([place])   . Pneumonia   . Restless legs   . S/P TAVR (transcatheter aortic valve replacement) 10/21/2018   26 mm Edwards Sapien 3 Ultra transcatheter heart valve placed via percutaneous right transfemoral approach   . Severe aortic stenosis    severe    Past Surgical History:  Procedure Laterality Date  .  ABDOMINAL AORTOGRAM W/LOWER EXTREMITY Bilateral 07/16/2018   Procedure: ABDOMINAL AORTOGRAM W/LOWER EXTREMITY;  Surgeon: Wellington Hampshire, MD;  Location: [place] CV LAB;  Service: Cardiovascular;  Laterality: Bilateral;  . ABDOMINAL AORTOGRAM W/LOWER EXTREMITY N/A 04/09/2019   Procedure: ABDOMINAL AORTOGRAM W/LOWER EXTREMITY - Right Lower;  Surgeon: Waynetta Sandy, MD;  Location: [place] CV LAB;  Service: Cardiovascular;  Laterality: N/A;  . AMPUTATION Left 08/19/2018   Procedure: AMPUTATION LEFT TOES SECOND AND THIRD;  Surgeon: Waynetta Sandy, MD;  Location: [place];  Service: Vascular;  Laterality: Left;  . AMPUTATION Right 05/07/2019   Procedure: AMPUTATION BELOW KNEE;  Surgeon: Serafina Mitchell, MD;  Location: [facility] OR;  Service: Vascular;  Laterality: Right;  . AV FISTULA PLACEMENT Right 12/27/2015   Procedure: Right Arm ARTERIOVENOUS (AV) FISTULA CREATION;  Surgeon: Angelia Mould, MD;  Location: [place];  Service: Vascular;  Laterality: Right;  . CATARACT EXTRACTION W/ INTRAOCULAR LENS  IMPLANT, BILATERAL    . COLONOSCOPY    . ERCP W/ SPHICTEROTOMY    . FEMORAL-POPLITEAL BYPASS GRAFT Left 07/22/2018   Procedure: left FEMORAL TO BELOW KNEE POPLITEAL ARTERY BYPASS GREAFT using 64mm removable ring propaten gore graft;  Surgeon: Waynetta Sandy, MD;  Location: [place];  Service: Vascular;  Laterality: Left;  . FEMORAL-POPLITEAL BYPASS GRAFT  Right 04/10/2019   Procedure: BYPASS GRAFT RIGHT FEMORAL-POPLITEAL ARTERY;  Surgeon: Rosetta Posner, MD;  Location: [place];  Service: Vascular;  Laterality: Right;  . I & D EXTREMITY Right 05/05/2019   Procedure: Revision of  RIGHT  FOOT Amputation;  Surgeon: Waynetta Sandy, MD;  Location: [place];  Service: Vascular;  Laterality: Right;  . INTRAOPERATIVE TRANSTHORACIC ECHOCARDIOGRAM N/A 10/21/2018   Procedure: Intraoperative Transthoracic Echocardiogram;  Surgeon: Sherren Mocha, MD;  Location: [place];  Service: Open  Heart Surgery;  Laterality: N/A;  . IR GENERIC HISTORICAL  01/27/2016   IR FLUORO GUIDE CV LINE RIGHT 01/27/2016 Arne Cleveland, MD [facility]INTER[facility]  . IR GENERIC HISTORICAL  01/27/2016   IR US GUIDE VASC ACCESS RIGHT 01/27/2016 Arne Cleveland, MD [facility]  . LAPAROSCOPIC CHOLECYSTECTOMY  03/2011  . LEFT HEART CATH AND CORONARY ANGIOGRAPHY N/A 11/25/2017   Procedure: LEFT HEART CATH AND CORONARY ANGIOGRAPHY;  Surgeon: Martinique, Peter M, MD;  Location: [place] [facility];  Service: Cardiovascular;  Laterality: N/A;  . LEFT HEART CATH AND CORONARY ANGIOGRAPHY N/A 07/24/2018   Procedure: LEFT HEART CATH AND CORONARY ANGIOGRAPHY;  Surgeon: Sherren Mocha, MD;  Location: [place] [facility];  Service: Cardiovascular;  Laterality: N/A;  . RENAL BIOPSY, PERCUTANEOUS Right 09/08/2014  . RIGHT/LEFT HEART CATH AND CORONARY ANGIOGRAPHY N/A 03/25/2018   Procedure: RIGHT/LEFT HEART CATH AND CORONARY ANGIOGRAPHY;  Surgeon: Martinique, Peter M, MD;  Location: [place] [facility];  Service: Cardiovascular;  Laterality: N/A;  . TEE WITHOUT CARDIOVERSION N/A 05/14/2019   Procedure: TRANSESOPHAGEAL ECHOCARDIOGRAM (TEE);  Surgeon: Fay Records, MD;  Location: [facility] ENDOSCOPY;  Service: Cardiovascular;  Laterality: N/A;  . TOE AMPUTATION Left 08/19/2018   2nd & 3rd toes  . TONSILLECTOMY  ~ 1960  . TRANSCATHETER AORTIC VALVE REPLACEMENT, TRANSFEMORAL N/A 10/21/2018   Procedure: TRANSCATHETER AORTIC VALVE REPLACEMENT, TRANSFEMORAL;  Surgeon: Sherren Mocha, MD;  Location: [place];  Service: Open Heart Surgery;  Laterality: N/A;  . TRANSMETATARSAL AMPUTATION Right 04/29/2019   Procedure: RIGHT TRANSMETATARSAL AMPUTATION;  Surgeon: Waynetta Sandy, MD;  Location: [place];  Service: Vascular;  Laterality: Right;  . ULTRASOUND GUIDANCE FOR VASCULAR ACCESS  03/25/2018   Procedure: Ultrasound Guidance For Vascular Access;  Surgeon: Martinique, Peter M, MD;  Location: [place] [facility] LAB;  Service: Cardiovascular;;    There were no  vitals filed for this visit.    Subjective Assessment - 09/22/19 1425    Subjective This 69yo male was referred on 09/04/2019 for PT prosthetic evaluation & treatment by Curt Jews, MD. He underwent a right Transtibial Amputation on 05/07/2019. Acute hospitalization 04/29/2019 - 05/18/2019 and Inpatient Rehab 05/18/2019 - 05/28/2019. He received prosthesis on 09/10/2019.    Patient is accompained by: Family member   dtr, Alysia Penna   Pertinent History Rt TTA, left 2nd & 3rd toe amp 08/19/2018, ESRD, HTN, CHF, aortic stenosis, left fem-pop bypass, peripheral neuropathy, back spasms,    Patient Stated Goals use prosthesis to be active in community, working on car, day travels, walks    Currently in Pain? Yes    Pain Score 6    in last week, worst 9/10, best 0/10   Pain Location Leg    Pain Orientation Right    Pain Descriptors / Indicators Squeezing;Throbbing    Pain Type Phantom pain    Pain Onset More than a month ago    Pain Frequency Intermittent    Aggravating Factors  maybe related to dialysis    Pain Relieving  Factors 5mg  oxy    Multiple Pain Sites Yes    Pain Score 7   in last week worst 8-9/10, best 0/10   Pain Location Back    Pain Orientation Right;Lower    Pain Descriptors / Indicators Spasm;Sharp    Pain Type Chronic pain    Pain Onset More than a month ago    Pain Frequency Intermittent    Aggravating Factors  sitting, stationary    Pain Relieving Factors stand or change position              [facility] PT Assessment - 09/22/19 1425      Assessment   Medical Diagnosis Right Transtibial Amputation    Referring Provider (PT) Curt Jews, MD    Onset Date/Surgical Date 09/10/19   prosthesis delivery   Hand Dominance Right    Prior Therapy Inpatient rehab, HHPT until late May      Precautions   Precautions Fall    Precaution Comments No BP in RUE      Restrictions   Weight Bearing Restrictions No      Balance Screen   Has the patient fallen in the past 6 months Yes    How  many times? 4   superficial injuries,    Has the patient had a decrease in activity level because of a fear of falling?  Yes    Is the patient reluctant to leave their home because of a fear of falling?  Yes      [place] Private residence    [place]   was alone but 69yo dtr moved in to care for him   Type of [place] to enter;Ramped entrance   curb + 2 + 1 steps then ramp   Entrance Stairs-Number of Steps 2 + 1    Entrance Stairs-Rails None    Home Layout One level    Home Equipment Wheelchair - manual;Walker - 2 wheels;Walker - 4 wheels;Cane - single point;Bedside commode;Tub bench      Prior Function   Level of Independence Independent;Independent with household mobility without device;Independent with community mobility with device   cane in community including dialysis   Vocation Retired    Leisure shopping      Posture/Postural Control   Posture/Postural Control Postural limitations    Postural Limitations Rounded Shoulders;Forward head;Flexed trunk;Weight shift left      ROM / Strength   AROM / PROM / Strength PROM;Strength      PROM   Overall PROM  Deficits    Overall PROM Comments Pt appears to have tightness in bilateral hip flexors    Right Knee Extension -12    Left Knee Extension -17      Strength   Overall Strength Deficits    Right Hip Flexion 4/5    Right Hip Extension 3/5   gross testing sitting & functional standing   Right Hip ABduction 3+/5   gross testing sitting & functional standing   Left Hip Flexion 4/5    Left Hip Extension 3-/5   gross testing sitting & functional standing   Left Hip ABduction 3/5   gross testing sitting & functional standing   Right/Left Knee Right;Left    Right Knee Flexion 4/5   gross testing sitting & functional standing   Right Knee Extension 4/5    Left Knee Flexion 4/5   gross testing sitting & functional standing   Left Knee Extension  4/5     Left Ankle Dorsiflexion 4/5      Transfers   Transfers Sit to Stand;Stand to Sit    Sit to Stand 5: Supervision;With upper extremity assist;With armrests;From chair/3-in-1;Other (comment)   requires RW to stabilize   Stand to Sit 5: Supervision;With upper extremity assist;With armrests;To chair/3-in-1;Other (comment)   requires RW for stability     Ambulation/Gait   Ambulation/Gait Yes    Ambulation/Gait Assistance 4: Min guard    Ambulation/Gait Assistance Details excessive UE weight bearing on RW & partial weight on prosthesis    Ambulation Distance (Feet) 20 Feet    Assistive device Rolling walker;Prosthesis    Gait Pattern Step-to pattern;Decreased step length - left;Decreased stance time - right;Decreased stride length;Decreased hip/knee flexion - right;Decreased weight shift to right;Right hip hike;Right flexed knee in stance;Left flexed knee in stance;Antalgic;Lateral hip instability;Trunk flexed;Abducted- right    Ambulation Surface Level;Indoor      Standardized Balance Assessment   Standardized Balance Assessment Berg Balance Test      Berg Balance Test   Sit to Stand Needs minimal aid to stand or to stabilize    Standing Unsupported Needs several tries to stand 30 seconds unsupported    Sitting with Back Unsupported but Feet Supported on Floor or Stool Able to sit safely and securely 2 minutes    Stand to Sit Controls descent by using hands    Transfers Able to transfer safely, definite need of hands    Standing Unsupported with Eyes Closed Needs help to keep from falling    Standing Unsupported with Feet Together Needs help to attain position and unable to hold for 15 seconds    From Standing, Reach Forward with Outstretched Arm Loses balance while trying/requires external support    From Standing Position, Pick up Object from Floor Unable to try/needs assist to keep balance    From Standing Position, Turn to Look Behind Over each Shoulder Needs assist to keep from losing  balance and falling    Turn 360 Degrees Needs assistance while turning    Standing Unsupported, Alternately Place Feet on Step/Stool Needs assistance to keep from falling or unable to try    Standing Unsupported, One Foot in Front Loses balance while stepping or standing    Standing on One Leg Unable to try or needs assist to prevent fall    Total Score 12           Prosthetics Assessment - 09/22/19 1425      Prosthetics   Prosthetic Care Dependent with Skin check;Residual limb care;Care of non-amputated limb;Prosthetic cleaning;Ply sock cleaning;Correct ply sock adjustment;Proper wear schedule/adjustment;Proper weight-bearing schedule/adjustment    Donning prosthesis  Min assist    Doffing prosthesis  Supervision    Current prosthetic wear tolerance (days/week)  2 of 12 days since delivery    Current prosthetic wear tolerance (#hours/day)  10-15 minutes    Current prosthetic weight-bearing tolerance (hours/day)  Patient tolerated standing for 3 minutes with partial weight on prosthesis with no limb pain.     Edema significant pitting.  He did not wear shrinker last night or today prior to PT appt.  He also reports had to come off dialysis 1 hour early yesterday so did not get down to dry weight.     Residual limb condition  wound on distal limb 46mm X 30 mm scab with no signs of infection, minimal hair growth below knee, normal color & temperature, bulbous shape, slightly dry skin,  Prosthesis Description silicon liner with shuttle pin lock                     Objective measurements completed on examination: See above findings.       [place] Adult PT Treatment/Exercise - 09/22/19 1425      Prosthetics   Prosthetic Care Comments  Initiate wear 2 hrs 2x/day (attempt 2nd wear on dialysis days if out of bed after dialysis).  PT applied Tegaderm to wound and instructed dtr to apply prior to 1st wear and remove after 2nd wear daily.  Need to wear shrinker at all times not  wearing prosthesis.    Education Provided Skin check;Residual limb care;Prosthetic cleaning;Correct ply sock adjustment;Proper Donning;Proper wear schedule/adjustment;Other (comment)   See prosthetic care comments   Person(s) Educated Patient;Child(ren)    Education Method Explanation;Demonstration;Tactile cues;Verbal cues    Education Method Verbalized understanding;Verbal cues required;Needs further instruction;Tactile cues required                    PT Short Term Goals - 09/23/19 0753      PT SHORT TERM GOAL #1   Title Patient demonstrates proper donning of prosthesis & verbalizes proper cleaning.  (All STGs Target Date: 10/23/2019)    Time 4    Period Weeks    Status New    Target Date 10/23/19      PT SHORT TERM GOAL #2   Title Patient tolerates prosthesis wear >8 hours total / day with no increased skin issues    Time 4    Period Weeks    Status New    Target Date 10/23/19      PT SHORT TERM GOAL #3   Title Standing balance without UE support reaching 5" anteriorly without UE support and picks up item from floor with RW support.    Time 4    Period Weeks    Status New    Target Date 10/23/19      PT SHORT TERM GOAL #4   Title Patient ambulates 150' with RW & prosthesis with supervision.    Time 4    Period Weeks    Status New    Target Date 10/23/19      PT SHORT TERM GOAL #5   Title Patient negotiates ramps & curbs with rolling walker and stairs with 2 rails with prosthesis with minA.    Time 4    Period Weeks    Status New    Target Date 10/23/19             PT Long Term Goals - 09/22/19 1748      PT LONG TERM GOAL #1   Title Patient verbalizes & demonstrates understanding of proper prosthetic care to enable safe utilization of prosthesis.  (All LTGs Target Date: 12/18/2019)    Time 12    Period Weeks    Status New    Target Date 12/18/19      PT LONG TERM GOAL #2   Title Patient tolerates prosthesis wear >90% of awake hours without skin  issues & limb pain </= 2/10 to enable function throughout his day.    Time 12    Period Weeks    Status New    Target Date 12/18/19      PT LONG TERM GOAL #3   Title Berg Balance with prosthesis >/= 45/56 indicating lower fall risk.    Time 12    Period Weeks    Status  New    Target Date 12/18/19      PT LONG TERM GOAL #4   Title Patient ambulates 500' with cane or less & prosthesis modified independent for community mobility.    Time 12    Period Weeks    Status New    Target Date 12/18/19      PT LONG TERM GOAL #5   Title Patient negotiates ramps, curbs & stairs with single rail with cane or less and prosthesis modified independent to enable community access.    Time 12    Period Weeks    Status New    Target Date 12/18/19      Additional Long Term Goals   Additional Long Term Goals Yes      PT LONG TERM GOAL #6   Title Patient reports back & residual limb pain increases </= 2 increments on 0-10 scale with standing & gait activities.    Time 12    Period Weeks    Status New    Target Date 12/18/19                  Plan - 09/22/19 1733    Clinical Impression Statement This 69yo male underwent a right Transtibial Amputation on 05/07/2019 and received his first prosthesis on 09/10/2019. He has worn prosthesis 2 of 12 days since delivery for 10-15 minutes. He developed a wound on distal limb probrably from improper donning. He is dependent in proper prosthetic care. He has impaired range of motion & weakness negatively impacting his mobility. Berg Balance 12/56 indicates high fall risk and dependency in standing ADLs.  Patient's prosthetic gait has significant deviations including only tolerating partial weight on prosthesis with excessive weight bearing on rolling walker and only 20' gait.  Patient has history of chronic low back pain with spasms and right limb / phantom pain both limiting his activity tolerance. Patient would benefit from skilled PT services to improve  his mobility & safety with prosthesis.    Personal Factors and Comorbidities Comorbidity 3+;Fitness;Time since onset of injury/illness/exacerbation;Past/Current Experience    Comorbidities Rt TTA, left 2nd & 3rd toe amp 08/19/2018, ESRD, HTN, CHF, aortic stenosis, left fem-pop bypass, peripheral neuropathy, back spasms    Examination-Activity Limitations Lift;Locomotion Level;Stairs;Stand;Transfers    Examination-Participation Restrictions Community Activity;Shop    Stability/Clinical Decision Making Evolving/Moderate complexity    Clinical Decision Making Moderate    Rehab Potential Good    PT Frequency 2x / week    PT Duration 12 weeks    PT Treatment/Interventions ADLs/Self Care Home Management;Canalith Repostioning;DME Instruction;Gait training;Stair training;Functional mobility training;Therapeutic activities;Therapeutic exercise;Balance training;Neuromuscular re-education;Patient/family education;Prosthetic Training;Manual techniques;Scar mobilization;Passive range of motion;Dry needling;Joint Manipulations;Vestibular    PT Next Visit Plan review prosthetic care, Prosthetic gait instructing in ramps, curbs & stairs with RW, instruct in HEP at sink    Consulted and Agree with Plan of Care Patient;Family member/caregiver    Family Member Consulted Daughter, Alysia Penna           Patient will benefit from skilled therapeutic intervention in order to improve the following deficits and impairments:  Abnormal gait, Decreased activity tolerance, Decreased balance, Decreased endurance, Decreased knowledge of use of DME, Decreased mobility, Decreased range of motion, Decreased skin integrity, Decreased scar mobility, Decreased strength, Dizziness, Increased edema, Postural dysfunction, Prosthetic Dependency, Pain  Visit Diagnosis: Other abnormalities of gait and mobility  Muscle weakness (generalized)  Unsteadiness on feet  Abnormal posture  History of falling  Stiffness of right knee, not  elsewhere  classified  Stiffness of left knee, not elsewhere classified     Problem List Patient Active Problem List   Diagnosis Date Noted  . Gait abnormality 08/06/2019  . Back muscle spasm 07/09/2019  . Sleep disturbance   . Nausea without vomiting   . Orthostatic hypotension   . Acute on chronic anemia   . Chronic combined systolic and diastolic congestive heart failure ([place])   . ESRD on dialysis ([place])   . Right below-knee amputee ([place]) 05/18/2019  . Pressure injury of skin 05/14/2019  . Enterococcal bacteremia 05/13/2019  . Polymorphic ventricular tachycardia ([place]) 10/24/2018  . S/P TAVR (transcatheter aortic valve replacement) 10/21/2018  . Anxiety   . Coronary artery disease involving native coronary artery of native heart without angina pectoris   . Thrombocytopenia ([place])   . PAD (peripheral artery disease) ([place]) 07/22/2018  . Acute on chronic combined systolic and diastolic CHF (congestive heart failure) ([place]) 11/30/2017  . ESRD (end stage renal disease) on dialysis ([place])   . Iron deficiency anemia 11/22/2015  . AL amyloidosis ([place]) 09/25/2014  . Anemia of chronic disease 09/25/2014  . Essential hypertension, benign 11/06/2013    Jamey Reas PT, DPT 09/23/2019, 7:58 AM  [facility] Physical Therapy [address] [place], [place], [address] Phone: [phone]   Fax:  [phone]  Name: BRICYN LABRADA MRN: [id number] Date of Birth: 1950-10-10

## 2019-09-24 ENCOUNTER — Other Ambulatory Visit: Payer: Self-pay

## 2019-09-24 ENCOUNTER — Ambulatory Visit (INDEPENDENT_AMBULATORY_CARE_PROVIDER_SITE_OTHER): Payer: Medicare Other | Admitting: Physical Therapy

## 2019-09-24 DIAGNOSIS — R293 Abnormal posture: Secondary | ICD-10-CM | POA: Diagnosis not present

## 2019-09-24 DIAGNOSIS — M25662 Stiffness of left knee, not elsewhere classified: Secondary | ICD-10-CM

## 2019-09-24 DIAGNOSIS — M25661 Stiffness of right knee, not elsewhere classified: Secondary | ICD-10-CM

## 2019-09-24 DIAGNOSIS — R2689 Other abnormalities of gait and mobility: Secondary | ICD-10-CM | POA: Diagnosis not present

## 2019-09-24 DIAGNOSIS — R2681 Unsteadiness on feet: Secondary | ICD-10-CM

## 2019-09-24 DIAGNOSIS — M6281 Muscle weakness (generalized): Secondary | ICD-10-CM | POA: Diagnosis not present

## 2019-09-24 DIAGNOSIS — M545 Low back pain: Secondary | ICD-10-CM

## 2019-09-24 DIAGNOSIS — G8929 Other chronic pain: Secondary | ICD-10-CM

## 2019-09-24 DIAGNOSIS — Z9181 History of falling: Secondary | ICD-10-CM

## 2019-09-24 DIAGNOSIS — R262 Difficulty in walking, not elsewhere classified: Secondary | ICD-10-CM

## 2019-09-24 NOTE — Therapy (Signed)
[facility] Physical Therapy [address] [place], [place], [address] Phone: [phone]   Fax:  [phone]  Physical Therapy Treatment  Patient Details  Name: Jose Garrett MRN: [id number] Date of Birth: 14-Dec-1950 Referring Provider (PT): Curt Jews, MD   Encounter Date: 09/24/2019   PT End of Session - 09/24/19 1613    Visit Number 2    Number of Visits 25    Date for PT Re-Evaluation 12/21/19    Authorization Type UHC Medicare    Authorization Time Period $30 co-pay    PT Start Time 1430    PT Stop Time 1510    PT Time Calculation (min) 40 min    Equipment Utilized During Treatment Gait belt    Activity Tolerance Patient tolerated treatment well    Behavior During Therapy [facility] for tasks assessed/performed           Past Medical History:  Diagnosis Date  . Amyloidosis ([place])   . Anemia of chronic disease   . Anxiety   . CAD (coronary artery disease)    a. cardiac cath on 11/25/17 with occlusion of the RCA which could have been his event but the RCA could not be engaged. The RCA filled distally from left to right collaterals. There were no severe stenoses in the left coronary system. Medical therapy recommended, started on plavix.  . Chronic combined systolic and diastolic CHF (congestive heart failure) ([place])    a. EF dropped to 35-40% with grade 2 DD by echo 11/2017.  [name] DJD (degenerative joint disease), lumbar   . ESRD (end stage renal disease) ([place])    ESRD- HEMO MWF- [employer]  . GERD (gastroesophageal reflux disease)   . Hyperlipidemia   . Hypertension   . Myocardial infarction ([place])   . Neuropathy   . Peripheral vascular disease ([place])   . Pneumonia   . Restless legs   . S/P TAVR (transcatheter aortic valve replacement) 10/21/2018   26 mm Edwards Sapien 3 Ultra transcatheter heart valve placed via percutaneous right transfemoral approach   . Severe aortic stenosis    severe    Past Surgical History:  Procedure Laterality Date  .  ABDOMINAL AORTOGRAM W/LOWER EXTREMITY Bilateral 07/16/2018   Procedure: ABDOMINAL AORTOGRAM W/LOWER EXTREMITY;  Surgeon: Wellington Hampshire, MD;  Location: [place] CV [facility];  Service: Cardiovascular;  Laterality: Bilateral;  . ABDOMINAL AORTOGRAM W/LOWER EXTREMITY N/A 04/09/2019   Procedure: ABDOMINAL AORTOGRAM W/LOWER EXTREMITY - Right Lower;  Surgeon: Waynetta Sandy, MD;  Location: [place] CV LAB;  Service: Cardiovascular;  Laterality: N/A;  . AMPUTATION Left 08/19/2018   Procedure: AMPUTATION LEFT TOES SECOND AND THIRD;  Surgeon: Waynetta Sandy, MD;  Location: [place];  Service: Vascular;  Laterality: Left;  . AMPUTATION Right 05/07/2019   Procedure: AMPUTATION BELOW KNEE;  Surgeon: Serafina Mitchell, MD;  Location: [facility] OR;  Service: Vascular;  Laterality: Right;  . AV FISTULA PLACEMENT Right 12/27/2015   Procedure: Right Arm ARTERIOVENOUS (AV) FISTULA CREATION;  Surgeon: Angelia Mould, MD;  Location: [place];  Service: Vascular;  Laterality: Right;  . CATARACT EXTRACTION W/ INTRAOCULAR LENS  IMPLANT, BILATERAL    . COLONOSCOPY    . ERCP W/ SPHICTEROTOMY    . FEMORAL-POPLITEAL BYPASS GRAFT Left 07/22/2018   Procedure: left FEMORAL TO BELOW KNEE POPLITEAL ARTERY BYPASS GREAFT using 50mm removable ring propaten gore graft;  Surgeon: Waynetta Sandy, MD;  Location: [place];  Service: Vascular;  Laterality: Left;  . FEMORAL-POPLITEAL BYPASS GRAFT  Right 04/10/2019   Procedure: BYPASS GRAFT RIGHT FEMORAL-POPLITEAL ARTERY;  Surgeon: Rosetta Posner, MD;  Location: [place];  Service: Vascular;  Laterality: Right;  . I & D EXTREMITY Right 05/05/2019   Procedure: Revision of  RIGHT  FOOT Amputation;  Surgeon: Waynetta Sandy, MD;  Location: [place];  Service: Vascular;  Laterality: Right;  . INTRAOPERATIVE TRANSTHORACIC ECHOCARDIOGRAM N/A 10/21/2018   Procedure: Intraoperative Transthoracic Echocardiogram;  Surgeon: Sherren Mocha, MD;  Location: [place];  Service: Open  Heart Surgery;  Laterality: N/A;  . IR GENERIC HISTORICAL  01/27/2016   IR FLUORO GUIDE CV LINE RIGHT 01/27/2016 Arne Cleveland, MD [facility]INTER[facility]  . IR GENERIC HISTORICAL  01/27/2016   IR US GUIDE VASC ACCESS RIGHT 01/27/2016 Arne Cleveland, MD [facility]INTER[facility]  . LAPAROSCOPIC CHOLECYSTECTOMY  03/2011  . LEFT HEART CATH AND CORONARY ANGIOGRAPHY N/A 11/25/2017   Procedure: LEFT HEART CATH AND CORONARY ANGIOGRAPHY;  Surgeon: Martinique, Peter M, MD;  Location: [place] [facility];  Service: Cardiovascular;  Laterality: N/A;  . LEFT HEART CATH AND CORONARY ANGIOGRAPHY N/A 07/24/2018   Procedure: LEFT HEART CATH AND CORONARY ANGIOGRAPHY;  Surgeon: Sherren Mocha, MD;  Location: [place] [facility];  Service: Cardiovascular;  Laterality: N/A;  . RENAL BIOPSY, PERCUTANEOUS Right 09/08/2014  . RIGHT/LEFT HEART CATH AND CORONARY ANGIOGRAPHY N/A 03/25/2018   Procedure: RIGHT/LEFT HEART CATH AND CORONARY ANGIOGRAPHY;  Surgeon: Martinique, Peter M, MD;  Location: [place] [facility];  Service: Cardiovascular;  Laterality: N/A;  . TEE WITHOUT CARDIOVERSION N/A 05/14/2019   Procedure: TRANSESOPHAGEAL ECHOCARDIOGRAM (TEE);  Surgeon: Fay Records, MD;  Location: [facility] ENDOSCOPY;  Service: Cardiovascular;  Laterality: N/A;  . TOE AMPUTATION Left 08/19/2018   2nd & 3rd toes  . TONSILLECTOMY  ~ 1960  . TRANSCATHETER AORTIC VALVE REPLACEMENT, TRANSFEMORAL N/A 10/21/2018   Procedure: TRANSCATHETER AORTIC VALVE REPLACEMENT, TRANSFEMORAL;  Surgeon: Sherren Mocha, MD;  Location: [place];  Service: Open Heart Surgery;  Laterality: N/A;  . TRANSMETATARSAL AMPUTATION Right 04/29/2019   Procedure: RIGHT TRANSMETATARSAL AMPUTATION;  Surgeon: Waynetta Sandy, MD;  Location: [place];  Service: Vascular;  Laterality: Right;  . ULTRASOUND GUIDANCE FOR VASCULAR ACCESS  03/25/2018   Procedure: Ultrasound Guidance For Vascular Access;  Surgeon: Martinique, Peter M, MD;  Location: [place] CV LAB;  Service: Cardiovascular;;    There were no  vitals filed for this visit.   Subjective Assessment - 09/24/19 1607    Subjective he relays he is having some swelling in his limb so sometimes it is hard to get into prosthesis, still with occasional phatom limb pains.    Patient is accompained by: Family member   dtr, Alysia Penna   Pertinent History Rt TTA, left 2nd & 3rd toe amp 08/19/2018, ESRD, HTN, CHF, aortic stenosis, left fem-pop bypass, peripheral neuropathy, back spasms,    Patient Stated Goals use prosthesis to be active in community, working on car, day travels, walks    Pain Onset More than a month ago    Pain Onset More than a month ago               [facility] Adult PT Treatment/Exercise - 09/24/19 0001      Transfers   Transfers Sit to Stand;Stand to Sit    Sit to Stand 5: Supervision;With upper extremity assist;With armrests;From chair/3-in-1;Other (comment)    Stand to Sit 5: Supervision;With upper extremity assist;With armrests;To chair/3-in-1;Other (comment)    Number of Reps 10 reps   spread out during session  Ambulation/Gait   Ambulation/Gait Yes    Ambulation/Gait Assistance 4: Min guard    Ambulation Distance (Feet) 100 Feet    Assistive device Rolling walker;Prosthesis    Gait Pattern Step-to pattern;Decreased step length - left;Decreased stance time - right;Decreased stride length;Decreased hip/knee flexion - right;Decreased weight shift to right;Right hip hike;Right flexed knee in stance;Left flexed knee in stance;Antalgic;Lateral hip instability;Trunk flexed;Abducted- right    Ambulation Surface Level;Indoor    Ramp 4: Min assist    Ramp Details (indicate cue type and reason) cues and demo for proper technique and sequencing    Curb 4: Min assist    Curb Details (indicate cue type and reason) 6 inch curb cues and demo for proper technique and sequencing      Therapeutic Activites    Therapeutic Activities Other Therapeutic Activities    Other Therapeutic Activities mini squats at sink X 10 reps with  cues to sit back toward chair behind him and to not pull up so much with UE      Neuro Re-ed    Neuro Re-ed Details  standing at sink balance exercises X 10 reps ea, see patient instuctions      Prosthetics   Prosthetic Care Comments  importance of wearing shrinker or liner to shape limb and control edema. Education for alt leg marches and steps to allow to sink down further into socket upon standing before he attempts ambulation and education on when to add socks                    PT Short Term Goals - 09/23/19 0753      PT SHORT TERM GOAL #1   Title Patient demonstrates proper donning of prosthesis & verbalizes proper cleaning.  (All STGs Target Date: 10/23/2019)    Time 4    Period Weeks    Status New    Target Date 10/23/19      PT SHORT TERM GOAL #2   Title Patient tolerates prosthesis wear >8 hours total / day with no increased skin issues    Time 4    Period Weeks    Status New    Target Date 10/23/19      PT SHORT TERM GOAL #3   Title Standing balance without UE support reaching 5" anteriorly without UE support and picks up item from floor with RW support.    Time 4    Period Weeks    Status New    Target Date 10/23/19      PT SHORT TERM GOAL #4   Title Patient ambulates 150' with RW & prosthesis with supervision.    Time 4    Period Weeks    Status New    Target Date 10/23/19      PT SHORT TERM GOAL #5   Title Patient negotiates ramps & curbs with rolling walker and stairs with 2 rails with prosthesis with minA.    Time 4    Period Weeks    Status New    Target Date 10/23/19             PT Long Term Goals - 09/22/19 1748      PT LONG TERM GOAL #1   Title Patient verbalizes & demonstrates understanding of proper prosthetic care to enable safe utilization of prosthesis.  (All LTGs Target Date: 12/18/2019)    Time 12    Period Weeks    Status New    Target Date 12/18/19  PT LONG TERM GOAL #2   Title Patient tolerates prosthesis wear  >90% of awake hours without skin issues & limb pain </= 2/10 to enable function throughout his day.    Time 12    Period Weeks    Status New    Target Date 12/18/19      PT LONG TERM GOAL #3   Title Berg Balance with prosthesis >/= 45/56 indicating lower fall risk.    Time 12    Period Weeks    Status New    Target Date 12/18/19      PT LONG TERM GOAL #4   Title Patient ambulates 500' with cane or less & prosthesis modified independent for community mobility.    Time 12    Period Weeks    Status New    Target Date 12/18/19      PT LONG TERM GOAL #5   Title Patient negotiates ramps, curbs & stairs with single rail with cane or less and prosthesis modified independent to enable community access.    Time 12    Period Weeks    Status New    Target Date 12/18/19      Additional Long Term Goals   Additional Long Term Goals Yes      PT LONG TERM GOAL #6   Title Patient reports back & residual limb pain increases </= 2 increments on 0-10 scale with standing & gait activities.    Time 12    Period Weeks    Status New    Target Date 12/18/19                 Plan - 09/24/19 1614    Clinical Impression Statement Session focused on establishing HEP for standing balance activities at sink along with progressing this therapeutic activities of gait, curbs negotiation and ramp negotiation. Overall he had good tolerance to activities but does get easily fatigued. He showed good understanding of HEP. Continue POC.    Personal Factors and Comorbidities Comorbidity 3+;Fitness;Time since onset of injury/illness/exacerbation;Past/Current Experience    Comorbidities Rt TTA, left 2nd & 3rd toe amp 08/19/2018, ESRD, HTN, CHF, aortic stenosis, left fem-pop bypass, peripheral neuropathy, back spasms    Examination-Activity Limitations Lift;Locomotion Level;Stairs;Stand;Transfers    Examination-Participation Restrictions Community Activity;Shop    Stability/Clinical Decision Making  Evolving/Moderate complexity    Rehab Potential Good    PT Frequency 2x / week    PT Duration 12 weeks    PT Treatment/Interventions ADLs/Self Care Home Management;Canalith Repostioning;DME Instruction;Gait training;Stair training;Functional mobility training;Therapeutic activities;Therapeutic exercise;Balance training;Neuromuscular re-education;Patient/family education;Prosthetic Training;Manual techniques;Scar mobilization;Passive range of motion;Dry needling;Joint Manipulations;Vestibular    PT Next Visit Plan review prosthetic care, review Prosthetic gait instructing in ramps, curbs & stairs with RW,    PT Home Exercise Plan .ampsink, see pt instructions section on 09/24/19 note    Consulted and Agree with Plan of Care Patient;Family member/caregiver    Family Member Consulted Daughter, Alysia Penna           Patient will benefit from skilled therapeutic intervention in order to improve the following deficits and impairments:  Abnormal gait, Decreased activity tolerance, Decreased balance, Decreased endurance, Decreased knowledge of use of DME, Decreased mobility, Decreased range of motion, Decreased skin integrity, Decreased scar mobility, Decreased strength, Dizziness, Increased edema, Postural dysfunction, Prosthetic Dependency, Pain  Visit Diagnosis: Other abnormalities of gait and mobility  Muscle weakness (generalized)  Unsteadiness on feet  Abnormal posture  History of falling  Stiffness of right knee, not  elsewhere classified  Stiffness of left knee, not elsewhere classified  Chronic bilateral low back pain without sciatica  Difficulty in walking, not elsewhere classified     Problem List Patient Active Problem List   Diagnosis Date Noted  . Gait abnormality 08/06/2019  . Back muscle spasm 07/09/2019  . Sleep disturbance   . Nausea without vomiting   . Orthostatic hypotension   . Acute on chronic anemia   . Chronic combined systolic and diastolic congestive  heart failure ([place])   . ESRD on dialysis ([place])   . Right below-knee amputee ([place]) 05/18/2019  . Pressure injury of skin 05/14/2019  . Enterococcal bacteremia 05/13/2019  . Polymorphic ventricular tachycardia ([place]) 10/24/2018  . S/P TAVR (transcatheter aortic valve replacement) 10/21/2018  . Anxiety   . Coronary artery disease involving native coronary artery of native heart without angina pectoris   . Thrombocytopenia ([place])   . PAD (peripheral artery disease) ([place]) 07/22/2018  . Acute on chronic combined systolic and diastolic CHF (congestive heart failure) ([place]) 11/30/2017  . ESRD (end stage renal disease) on dialysis ([place])   . Iron deficiency anemia 11/22/2015  . AL amyloidosis ([place]) 09/25/2014  . Anemia of chronic disease 09/25/2014  . Essential hypertension, benign 11/06/2013    Silvestre Mesi 09/24/2019, 4:16 PM  [facility] Physical Therapy [address] [place], [place], [address] Phone: [phone]   Fax:  [phone]  Name: DAVE MERGEN MRN: [id number] Date of Birth: 1950/11/10

## 2019-09-24 NOTE — Patient Instructions (Signed)
Do each exercise 1-2  times per day Do each exercise 10 repetitions Hold each exercise for 2 seconds to feel your location  AT SINK FIND YOUR MIDLINE POSITION AND PLACE FEET EQUAL DISTANCE FROM THE MIDLINE.  Try to find this position when standing still for activities.   USE TAPE ON FLOOR TO MARK THE MIDLINE POSITION. You also should try to feel with your limb pressure in socket.  You are trying to feel with limb what you used to feel with the bottom of your foot.  1. Side to Side Shift: Moving your hips only (not shoulders): move weight onto your left leg, HOLD/FEEL.  Move back to equal weight on each leg, HOLD/FEEL. Move weight onto your right leg, HOLD/FEEL. Move back to equal weight on each leg, HOLD/FEEL. Repeat.  Start with both hands on sink, progress to right hand only, then no hands.  2. Front to Back Shift: Moving your hips only (not shoulders): move your weight forward onto your toes, HOLD/FEEL. Move your weight back to equal Flat Foot on both legs, HOLD/FEEL. Move your weight back onto your heels, HOLD/FEEL. Move your weight back to equal on both legs, HOLD/FEEL. Repeat.   tart with both hands on sink, progress to right hand only, then no hands. 3. Moving Cones / Cups: With equal weight on each leg: Hold on with one hand the first time, then progress to no hand supports. Move cups from one side of sink to the other. Place cups ~2" out of your reach, progress to 10" beyond reach.  Place one hand in middle of sink and reach with other hand. Do both arms.  Then hover one hand and move cups with other hand.  4. Overhead/Upward Reaching: alternated reaching up to top cabinets or ceiling if no cabinets present. Keep equal weight on each leg. Start with one hand support on counter while other hand reaches and progress to no hand support with reaching.  ace one hand in middle of sink and reach with other hand. Do both arms.  Then hover one hand and move cups with other hand.  5.   Looking Over  Shoulders: With equal weight on each leg: alternate turning to look over your shoulders with one hand support on counter as needed.  Start with head motions only to look in front of shoulder, then even with shoulder and progress to looking behind you. To look to side, move head /eyes, then shoulder on side looking pulls back, shift more weight to side looking and pull hip back. ace one hand in middle of sink and let go with other hand so your shoulder can pull back. Switch hands to look other way.   Then hover one hand and move cups with other hand.  6.  Stepping with leg that is not amputated:  Move items under cabinet out of your way. Shift your hips/pelvis so weight on prosthesis. SLOWLY step other leg so front of foot is in cabinet. Then step back to floor.   

## 2019-09-25 DIAGNOSIS — Z992 Dependence on renal dialysis: Secondary | ICD-10-CM | POA: Diagnosis not present

## 2019-09-25 DIAGNOSIS — D509 Iron deficiency anemia, unspecified: Secondary | ICD-10-CM | POA: Diagnosis not present

## 2019-09-25 DIAGNOSIS — D688 Other specified coagulation defects: Secondary | ICD-10-CM | POA: Diagnosis not present

## 2019-09-25 DIAGNOSIS — N186 End stage renal disease: Secondary | ICD-10-CM | POA: Diagnosis not present

## 2019-09-25 DIAGNOSIS — D631 Anemia in chronic kidney disease: Secondary | ICD-10-CM | POA: Diagnosis not present

## 2019-09-25 DIAGNOSIS — N2581 Secondary hyperparathyroidism of renal origin: Secondary | ICD-10-CM | POA: Diagnosis not present

## 2019-09-28 DIAGNOSIS — D688 Other specified coagulation defects: Secondary | ICD-10-CM | POA: Diagnosis not present

## 2019-09-28 DIAGNOSIS — D509 Iron deficiency anemia, unspecified: Secondary | ICD-10-CM | POA: Diagnosis not present

## 2019-09-28 DIAGNOSIS — N186 End stage renal disease: Secondary | ICD-10-CM | POA: Diagnosis not present

## 2019-09-28 DIAGNOSIS — D631 Anemia in chronic kidney disease: Secondary | ICD-10-CM | POA: Diagnosis not present

## 2019-09-28 DIAGNOSIS — N2581 Secondary hyperparathyroidism of renal origin: Secondary | ICD-10-CM | POA: Diagnosis not present

## 2019-09-28 DIAGNOSIS — Z992 Dependence on renal dialysis: Secondary | ICD-10-CM | POA: Diagnosis not present

## 2019-09-29 ENCOUNTER — Other Ambulatory Visit: Payer: Self-pay

## 2019-09-29 ENCOUNTER — Ambulatory Visit: Payer: Medicare Other | Admitting: Physical Therapy

## 2019-09-29 ENCOUNTER — Encounter: Payer: Self-pay | Admitting: Physical Therapy

## 2019-09-29 DIAGNOSIS — M25661 Stiffness of right knee, not elsewhere classified: Secondary | ICD-10-CM

## 2019-09-29 DIAGNOSIS — M25662 Stiffness of left knee, not elsewhere classified: Secondary | ICD-10-CM

## 2019-09-29 DIAGNOSIS — R2689 Other abnormalities of gait and mobility: Secondary | ICD-10-CM | POA: Diagnosis not present

## 2019-09-29 DIAGNOSIS — M6281 Muscle weakness (generalized): Secondary | ICD-10-CM | POA: Diagnosis not present

## 2019-09-29 DIAGNOSIS — R293 Abnormal posture: Secondary | ICD-10-CM

## 2019-09-29 DIAGNOSIS — R2681 Unsteadiness on feet: Secondary | ICD-10-CM | POA: Diagnosis not present

## 2019-09-29 NOTE — Therapy (Signed)
[facility] [address] [place], [place], [address] Phone: [phone]   Fax:  [phone]  Physical Therapy Treatment  Patient Details  Name: Jose Garrett MRN: [id number] Date of Birth: 1950/09/26 Referring Provider (PT): Curt Jews, MD   Encounter Date: 09/29/2019   PT End of Session - 09/29/19 1610    Visit Number 3    Number of Visits 25    Date for PT Re-Evaluation 12/21/19    Authorization Type UHC Medicare    Authorization Time Period $30 co-pay    PT Start Time 1530    PT Stop Time 1617    PT Time Calculation (min) 47 min    Equipment Utilized During Treatment Gait belt    Activity Tolerance Patient tolerated treatment well    Behavior During Therapy [facility] for tasks assessed/performed           Past Medical History:  Diagnosis Date  . Amyloidosis ([place])   . Anemia of chronic disease   . Anxiety   . CAD (coronary artery disease)    a. cardiac cath on 11/25/17 with occlusion of the RCA which could have been his event but the RCA could not be engaged. The RCA filled distally from left to right collaterals. There were no severe stenoses in the left coronary system. Medical therapy recommended, started on plavix.  . Chronic combined systolic and diastolic CHF (congestive heart failure) ([place])    a. EF dropped to 35-40% with grade 2 DD by echo 11/2017.  [name] DJD (degenerative joint disease), lumbar   . ESRD (end stage renal disease) ([place])    ESRD- HEMO MWF- [employer]  . GERD (gastroesophageal reflux disease)   . Hyperlipidemia   . Hypertension   . Myocardial infarction ([place])   . Neuropathy   . Peripheral vascular disease ([place])   . Pneumonia   . Restless legs   . S/P TAVR (transcatheter aortic valve replacement) 10/21/2018   26 mm Edwards Sapien 3 Ultra transcatheter heart valve placed via percutaneous right transfemoral approach   . Severe aortic stenosis    severe    Past Surgical History:  Procedure Laterality Date  .  ABDOMINAL AORTOGRAM W/LOWER EXTREMITY Bilateral 07/16/2018   Procedure: ABDOMINAL AORTOGRAM W/LOWER EXTREMITY;  Surgeon: Wellington Hampshire, MD;  Location: [place] [facility];  Service: Cardiovascular;  Laterality: Bilateral;  . ABDOMINAL AORTOGRAM W/LOWER EXTREMITY N/A 04/09/2019   Procedure: ABDOMINAL AORTOGRAM W/LOWER EXTREMITY - Right Lower;  Surgeon: Waynetta Sandy, MD;  Location: [place] [facility];  Service: Cardiovascular;  Laterality: N/A;  . AMPUTATION Left 08/19/2018   Procedure: AMPUTATION LEFT TOES SECOND AND THIRD;  Surgeon: Waynetta Sandy, MD;  Location: [place];  Service: Vascular;  Laterality: Left;  . AMPUTATION Right 05/07/2019   Procedure: AMPUTATION BELOW KNEE;  Surgeon: Serafina Mitchell, MD;  Location: [facility] OR;  Service: Vascular;  Laterality: Right;  . AV FISTULA PLACEMENT Right 12/27/2015   Procedure: Right Arm ARTERIOVENOUS (AV) FISTULA CREATION;  Surgeon: Angelia Mould, MD;  Location: [place];  Service: Vascular;  Laterality: Right;  . CATARACT EXTRACTION W/ INTRAOCULAR LENS  IMPLANT, BILATERAL    . COLONOSCOPY    . ERCP W/ SPHICTEROTOMY    . FEMORAL-POPLITEAL BYPASS GRAFT Left 07/22/2018   Procedure: left FEMORAL TO BELOW KNEE POPLITEAL ARTERY BYPASS GREAFT using 69mm removable ring propaten gore graft;  Surgeon: Waynetta Sandy, MD;  Location: [place];  Service: Vascular;  Laterality: Left;  . FEMORAL-POPLITEAL BYPASS GRAFT  Right 04/10/2019   Procedure: BYPASS GRAFT RIGHT FEMORAL-POPLITEAL ARTERY;  Surgeon: Rosetta Posner, MD;  Location: [place];  Service: Vascular;  Laterality: Right;  . I & D EXTREMITY Right 05/05/2019   Procedure: Revision of  RIGHT  FOOT Amputation;  Surgeon: Waynetta Sandy, MD;  Location: [place];  Service: Vascular;  Laterality: Right;  . INTRAOPERATIVE TRANSTHORACIC ECHOCARDIOGRAM N/A 10/21/2018   Procedure: Intraoperative Transthoracic Echocardiogram;  Surgeon: Sherren Mocha, MD;  Location: [place];  Service: Open  Heart Surgery;  Laterality: N/A;  . IR GENERIC HISTORICAL  01/27/2016   IR FLUORO GUIDE CV LINE RIGHT 01/27/2016 Arne Cleveland, MD [facility]  . IR GENERIC HISTORICAL  01/27/2016   IR US GUIDE VASC ACCESS RIGHT 01/27/2016 Arne Cleveland, MD [facility]  . LAPAROSCOPIC CHOLECYSTECTOMY  03/2011  . LEFT HEART CATH AND CORONARY ANGIOGRAPHY N/A 11/25/2017   Procedure: LEFT HEART CATH AND CORONARY ANGIOGRAPHY;  Surgeon: Martinique, Peter M, MD;  Location: [place] [facility];  Service: Cardiovascular;  Laterality: N/A;  . LEFT HEART CATH AND CORONARY ANGIOGRAPHY N/A 07/24/2018   Procedure: LEFT HEART CATH AND CORONARY ANGIOGRAPHY;  Surgeon: Sherren Mocha, MD;  Location: [place] [facility];  Service: Cardiovascular;  Laterality: N/A;  . RENAL BIOPSY, PERCUTANEOUS Right 09/08/2014  . RIGHT/LEFT HEART CATH AND CORONARY ANGIOGRAPHY N/A 03/25/2018   Procedure: RIGHT/LEFT HEART CATH AND CORONARY ANGIOGRAPHY;  Surgeon: Martinique, Peter M, MD;  Location: [place] [facility];  Service: Cardiovascular;  Laterality: N/A;  . TEE WITHOUT CARDIOVERSION N/A 05/14/2019   Procedure: TRANSESOPHAGEAL ECHOCARDIOGRAM (TEE);  Surgeon: Fay Records, MD;  Location: [facility]COPY;  Service: Cardiovascular;  Laterality: N/A;  . TOE AMPUTATION Left 08/19/2018   2nd & 3rd toes  . TONSILLECTOMY  ~ 1960  . TRANSCATHETER AORTIC VALVE REPLACEMENT, TRANSFEMORAL N/A 10/21/2018   Procedure: TRANSCATHETER AORTIC VALVE REPLACEMENT, TRANSFEMORAL;  Surgeon: Sherren Mocha, MD;  Location: [place];  Service: Open Heart Surgery;  Laterality: N/A;  . TRANSMETATARSAL AMPUTATION Right 04/29/2019   Procedure: RIGHT TRANSMETATARSAL AMPUTATION;  Surgeon: Waynetta Sandy, MD;  Location: [place];  Service: Vascular;  Laterality: Right;  . ULTRASOUND GUIDANCE FOR VASCULAR ACCESS  03/25/2018   Procedure: Ultrasound Guidance For Vascular Access;  Surgeon: Martinique, Peter M, MD;  Location: [place] CV [facility];  Service: Cardiovascular;;    There were no  vitals filed for this visit.   Subjective Assessment - 09/29/19 1523    Subjective No falls. He wore prosthesis on Saturday & Sunday for 2 hours but none on dialysis days. He still struggles with seat limb into socket.    Patient is accompained by: Family member   dtr, Alysia Penna   Pertinent History Rt TTA, left 2nd & 3rd toe amp 08/19/2018, ESRD, HTN, CHF, aortic stenosis, left fem-pop bypass, peripheral neuropathy, back spasms,    Patient Stated Goals use prosthesis to be active in community, working on car, day travels, walks    Currently in Pain? Yes    Pain Score 4     Pain Location Leg    Pain Orientation Right    Pain Descriptors / Indicators Other (Comment)   pinching   Pain Type Phantom pain    Pain Onset More than a month ago    Pain Frequency Constant    Aggravating Factors  wearing shrinker too long,    Pain Relieving Factors pain med    Pain Onset More than a month ago  [place] Adult PT Treatment/Exercise - 09/29/19 1520      Transfers   Transfers Sit to Stand;Stand to Sit    Sit to Stand 5: Supervision;With upper extremity assist;With armrests;From chair/3-in-1;Other (comment)   to RW   Stand to Sit 5: Supervision;With upper extremity assist;With armrests;To chair/3-in-1;Other (comment)   from RW     Ambulation/Gait   Ambulation/Gait Yes    Ambulation/Gait Assistance 4: Min guard;5: Supervision    Ambulation/Gait Assistance Details visual cues of theraband markers on RW for proper step width, upright posture and wt shift over prosthesis in stance.      Ambulation Distance (Feet) 150 Feet   50' X 3 working on adjusting ply socks, 150' end of session   Assistive device Rolling walker;Prosthesis    Gait Pattern Step-to pattern;Decreased step length - left;Decreased stance time - right;Decreased stride length;Decreased hip/knee flexion - right;Decreased weight shift to right;Right hip hike;Right flexed knee in stance;Left flexed  knee in stance;Antalgic;Lateral hip instability;Trunk flexed;Abducted- right    Ramp --    Curb --      Therapeutic Activites    Therapeutic Activities --    Other Therapeutic Activities --      Neuro Re-ed    Neuro Re-ed Details  --      Prosthetics   Prosthetic Care Comments  educated on adjusting ply socks with too few, too many & proper ply socks.  Increase wear to 3hrs 2x/day on non-dialysis days and 1x/day before dialysis.  Continue use of Tegaderm on wound    Current prosthetic wear tolerance (days/week)  2 of last 7 days.     Current prosthetic wear tolerance (#hours/day)  1-2 hours    Edema pitting edema, used wrap prior to PT today as shrinker causing limb discomfort,  PT recommended to wrap higher to level of prosthesis    Residual limb condition  wound on distal limb 84mm X 30 mm scab with no signs of infection, minimal hair growth below knee, normal color & temperature, bulbous shape, slightly dry skin,     Education Provided Skin check;Residual limb care;Prosthetic cleaning;Proper Donning;Correct ply sock adjustment;Proper wear schedule/adjustment    Person(s) Educated Patient    Education Method Explanation;Demonstration;Tactile cues;Verbal cues    Education Method Verbalized understanding;Returned demonstration;Tactile cues required;Verbal cues required;Needs further instruction    Donning Prosthesis Supervision                    PT Short Term Goals - 09/29/19 2214      PT SHORT TERM GOAL #1   Title Patient demonstrates proper donning of prosthesis & verbalizes proper cleaning.  (All STGs Target Date: 10/23/2019)    Time 4    Period Weeks    Status On-going    Target Date 10/23/19      PT SHORT TERM GOAL #2   Title Patient tolerates prosthesis wear >8 hours total / day with no increased skin issues    Time 4    Period Weeks    Status On-going    Target Date 10/23/19      PT SHORT TERM GOAL #3   Title Standing balance without UE support reaching 5"  anteriorly without UE support and picks up item from floor with RW support.    Time 4    Period Weeks    Status On-going    Target Date 10/23/19      PT SHORT TERM GOAL #4   Title Patient ambulates 150' with RW & prosthesis with  supervision.    Time 4    Period Weeks    Status On-going    Target Date 10/23/19      PT SHORT TERM GOAL #5   Title Patient negotiates ramps & curbs with rolling walker and stairs with 2 rails with prosthesis with minA.    Time 4    Period Weeks    Status On-going    Target Date 10/23/19             PT Long Term Goals - 09/29/19 2214      PT LONG TERM GOAL #1   Title Patient verbalizes & demonstrates understanding of proper prosthetic care to enable safe utilization of prosthesis.  (All LTGs Target Date: 12/18/2019)    Time 12    Period Weeks    Status On-going    Target Date 12/18/19      PT LONG TERM GOAL #2   Title Patient tolerates prosthesis wear >90% of awake hours without skin issues & limb pain </= 2/10 to enable function throughout his day.    Time 12    Period Weeks    Status On-going    Target Date 12/18/19      PT LONG TERM GOAL #3   Title Berg Balance with prosthesis >/= 45/56 indicating lower fall risk.    Time 12    Period Weeks    Status On-going    Target Date 12/18/19      PT LONG TERM GOAL #4   Title Patient ambulates 500' with cane or less & prosthesis modified independent for community mobility.    Time 12    Period Weeks    Status On-going    Target Date 12/18/19      PT LONG TERM GOAL #5   Title Patient negotiates ramps, curbs & stairs with single rail with cane or less and prosthesis modified independent to enable community access.    Time 12    Period Weeks    Status On-going    Target Date 12/18/19      PT LONG TERM GOAL #6   Title Patient reports back & residual limb pain increases </= 2 increments on 0-10 scale with standing & gait activities.    Time 12    Period Weeks    Status On-going    Target  Date 12/18/19                 Plan - 09/29/19 1611    Clinical Impression Statement PT educated on adjusting ply socks and he appears to have a basic understanding.  PT used visual cues for step width to decrease abduction.    Personal Factors and Comorbidities Comorbidity 3+;Fitness;Time since onset of injury/illness/exacerbation;Past/Current Experience    Comorbidities Rt TTA, left 2nd & 3rd toe amp 08/19/2018, ESRD, HTN, CHF, aortic stenosis, left fem-pop bypass, peripheral neuropathy, back spasms    Examination-Activity Limitations Lift;Locomotion Level;Stairs;Stand;Transfers    Examination-Participation Restrictions Community Activity;Shop    Stability/Clinical Decision Making Evolving/Moderate complexity    Rehab Potential Good    PT Frequency 2x / week    PT Duration 12 weeks    PT Treatment/Interventions ADLs/Self Care Home Management;Canalith Repostioning;DME Instruction;Gait training;Stair training;Functional mobility training;Therapeutic activities;Therapeutic exercise;Balance training;Neuromuscular re-education;Patient/family education;Prosthetic Training;Manual techniques;Scar mobilization;Passive range of motion;Dry needling;Joint Manipulations;Vestibular    PT Next Visit Plan review prosthetic care, review Prosthetic gait instructing in ramps, curbs & stairs with RW,    PT Home Exercise Plan .ampsink, see pt instructions section on 09/24/19  note    Consulted and Agree with Plan of Care Patient;Family member/caregiver    Family Member Consulted Daughter, Alysia Penna           Patient will benefit from skilled therapeutic intervention in order to improve the following deficits and impairments:  Abnormal gait, Decreased activity tolerance, Decreased balance, Decreased endurance, Decreased knowledge of use of DME, Decreased mobility, Decreased range of motion, Decreased skin integrity, Decreased scar mobility, Decreased strength, Dizziness, Increased edema, Postural dysfunction,  Prosthetic Dependency, Pain  Visit Diagnosis: Other abnormalities of gait and mobility  Muscle weakness (generalized)  Unsteadiness on feet  Abnormal posture  Stiffness of right knee, not elsewhere classified  Stiffness of left knee, not elsewhere classified     Problem List Patient Active Problem List   Diagnosis Date Noted  . Gait abnormality 08/06/2019  . Back muscle spasm 07/09/2019  . Sleep disturbance   . Nausea without vomiting   . Orthostatic hypotension   . Acute on chronic anemia   . Chronic combined systolic and diastolic congestive heart failure ([place])   . ESRD on dialysis ([place])   . Right below-knee amputee ([place]) 05/18/2019  . Pressure injury of skin 05/14/2019  . Enterococcal bacteremia 05/13/2019  . Polymorphic ventricular tachycardia ([place]) 10/24/2018  . S/P TAVR (transcatheter aortic valve replacement) 10/21/2018  . Anxiety   . Coronary artery disease involving native coronary artery of native heart without angina pectoris   . Thrombocytopenia ([place])   . PAD (peripheral artery disease) ([place]) 07/22/2018  . Acute on chronic combined systolic and diastolic CHF (congestive heart failure) ([place]) 11/30/2017  . ESRD (end stage renal disease) on dialysis ([place])   . Iron deficiency anemia 11/22/2015  . AL amyloidosis ([place]) 09/25/2014  . Anemia of chronic disease 09/25/2014  . Essential hypertension, benign 11/06/2013    Jamey Reas PT, DPT 09/29/2019, 10:16 PM  [facility] [address] [place], [place], [address] Phone: [phone]   Fax:  [phone]  Name: Jose Garrett MRN: [id number] Date of Birth: 08-02-1950

## 2019-09-30 ENCOUNTER — Other Ambulatory Visit: Payer: Self-pay

## 2019-09-30 DIAGNOSIS — D631 Anemia in chronic kidney disease: Secondary | ICD-10-CM | POA: Diagnosis not present

## 2019-09-30 DIAGNOSIS — D688 Other specified coagulation defects: Secondary | ICD-10-CM | POA: Diagnosis not present

## 2019-09-30 DIAGNOSIS — N2581 Secondary hyperparathyroidism of renal origin: Secondary | ICD-10-CM | POA: Diagnosis not present

## 2019-09-30 DIAGNOSIS — Z992 Dependence on renal dialysis: Secondary | ICD-10-CM | POA: Diagnosis not present

## 2019-09-30 DIAGNOSIS — D509 Iron deficiency anemia, unspecified: Secondary | ICD-10-CM | POA: Diagnosis not present

## 2019-09-30 DIAGNOSIS — N186 End stage renal disease: Secondary | ICD-10-CM | POA: Diagnosis not present

## 2019-09-30 MED ORDER — ATORVASTATIN CALCIUM 80 MG PO TABS: 80.0000 mg | ORAL_TABLET | Freq: Every day | ORAL | 0 refills | Status: DC

## 2019-10-02 DIAGNOSIS — N2581 Secondary hyperparathyroidism of renal origin: Secondary | ICD-10-CM | POA: Diagnosis not present

## 2019-10-02 DIAGNOSIS — D688 Other specified coagulation defects: Secondary | ICD-10-CM | POA: Diagnosis not present

## 2019-10-02 DIAGNOSIS — D631 Anemia in chronic kidney disease: Secondary | ICD-10-CM | POA: Diagnosis not present

## 2019-10-02 DIAGNOSIS — Z992 Dependence on renal dialysis: Secondary | ICD-10-CM | POA: Diagnosis not present

## 2019-10-02 DIAGNOSIS — N186 End stage renal disease: Secondary | ICD-10-CM | POA: Diagnosis not present

## 2019-10-02 DIAGNOSIS — D509 Iron deficiency anemia, unspecified: Secondary | ICD-10-CM | POA: Diagnosis not present

## 2019-10-05 ENCOUNTER — Ambulatory Visit: Payer: Medicare Other | Admitting: Physician Assistant

## 2019-10-05 DIAGNOSIS — D509 Iron deficiency anemia, unspecified: Secondary | ICD-10-CM | POA: Diagnosis not present

## 2019-10-05 DIAGNOSIS — D631 Anemia in chronic kidney disease: Secondary | ICD-10-CM | POA: Diagnosis not present

## 2019-10-05 DIAGNOSIS — Z992 Dependence on renal dialysis: Secondary | ICD-10-CM | POA: Diagnosis not present

## 2019-10-05 DIAGNOSIS — N2581 Secondary hyperparathyroidism of renal origin: Secondary | ICD-10-CM | POA: Diagnosis not present

## 2019-10-05 DIAGNOSIS — N186 End stage renal disease: Secondary | ICD-10-CM | POA: Diagnosis not present

## 2019-10-05 DIAGNOSIS — D688 Other specified coagulation defects: Secondary | ICD-10-CM | POA: Diagnosis not present

## 2019-10-06 ENCOUNTER — Encounter: Payer: Self-pay | Admitting: Cardiovascular Disease

## 2019-10-06 ENCOUNTER — Ambulatory Visit (INDEPENDENT_AMBULATORY_CARE_PROVIDER_SITE_OTHER): Payer: Medicare Other | Admitting: Cardiovascular Disease

## 2019-10-06 ENCOUNTER — Encounter: Payer: Medicare Other | Admitting: Physical Therapy

## 2019-10-06 ENCOUNTER — Other Ambulatory Visit: Payer: Self-pay

## 2019-10-06 ENCOUNTER — Telehealth: Payer: Medicare Other | Admitting: Family

## 2019-10-06 ENCOUNTER — Encounter: Payer: Self-pay | Admitting: Physical Medicine & Rehabilitation

## 2019-10-06 ENCOUNTER — Encounter: Payer: Medicare Other | Attending: Registered Nurse | Admitting: Physical Medicine & Rehabilitation

## 2019-10-06 VITALS — Ht 70.0 in | Wt 160.0 lb

## 2019-10-06 VITALS — BP 121/58 | HR 74 | Ht 70.0 in | Wt 165.0 lb

## 2019-10-06 DIAGNOSIS — M62830 Muscle spasm of back: Secondary | ICD-10-CM | POA: Diagnosis not present

## 2019-10-06 DIAGNOSIS — I5022 Chronic systolic (congestive) heart failure: Secondary | ICD-10-CM | POA: Diagnosis not present

## 2019-10-06 DIAGNOSIS — G629 Polyneuropathy, unspecified: Secondary | ICD-10-CM | POA: Diagnosis not present

## 2019-10-06 DIAGNOSIS — I739 Peripheral vascular disease, unspecified: Secondary | ICD-10-CM | POA: Diagnosis not present

## 2019-10-06 DIAGNOSIS — Z89511 Acquired absence of right leg below knee: Secondary | ICD-10-CM | POA: Insufficient documentation

## 2019-10-06 DIAGNOSIS — N186 End stage renal disease: Secondary | ICD-10-CM

## 2019-10-06 DIAGNOSIS — R269 Unspecified abnormalities of gait and mobility: Secondary | ICD-10-CM | POA: Diagnosis not present

## 2019-10-06 DIAGNOSIS — E785 Hyperlipidemia, unspecified: Secondary | ICD-10-CM

## 2019-10-06 DIAGNOSIS — I2510 Atherosclerotic heart disease of native coronary artery without angina pectoris: Secondary | ICD-10-CM | POA: Diagnosis not present

## 2019-10-06 DIAGNOSIS — Z992 Dependence on renal dialysis: Secondary | ICD-10-CM | POA: Insufficient documentation

## 2019-10-06 DIAGNOSIS — M5440 Lumbago with sciatica, unspecified side: Secondary | ICD-10-CM

## 2019-10-06 DIAGNOSIS — I5042 Chronic combined systolic (congestive) and diastolic (congestive) heart failure: Secondary | ICD-10-CM | POA: Insufficient documentation

## 2019-10-06 DIAGNOSIS — R7881 Bacteremia: Secondary | ICD-10-CM | POA: Insufficient documentation

## 2019-10-06 DIAGNOSIS — R5383 Other fatigue: Secondary | ICD-10-CM

## 2019-10-06 DIAGNOSIS — Z952 Presence of prosthetic heart valve: Secondary | ICD-10-CM

## 2019-10-06 MED ORDER — TIZANIDINE HCL 2 MG PO TABS: 2.0000 mg | ORAL_TABLET | Freq: Once | ORAL | 2 refills | Status: AC

## 2019-10-06 NOTE — Progress Notes (Signed)
Cardiology Office Note   Date:  10/09/2019   ID:  Jose Garrett, DOB [birth year], MRN [id number]  PCP:  [name], MD  Cardiologist:   Kathlyn Sacramento, MD   No chief complaint on file.     History of Present Illness: Jose Garrett is a 69 y.o. male who presents for a follow-up visit regarding peripheral arterial disease and critical limb ischemia.  He has extensive medical problems including ESRD on HD, AL amyloid (kidney involvement), aortic stenosis, HTN, HLD, GERD, anemia, chronic combined CHF and CAD . He was admitted to [facility] September 2019 with acute respiratory failure in setting of hypertensive urgency. His troponin was elevated and his EKG showed diffuse ST depression. Cardiac cath 11/25/17 with occlusion of the RCA which was felt to be the culprit but this vessel could not be engaged. The distal RCA filled from collaterals. There was moderate disease in the ostial Circumflex and mild disease in the LAD. Medical therapy was recommended. LVEF=35-40%. There was moderate aortic stenosis. Echo on 12/19/17 with LVEF=65-70%. Moderate AS (mean gradient 31 mmHg).   His amyloid is followed by oncology whose notes indicate prior tx with CyBorD, Velcade, Ixazomib andDexamethasone. Bone marrow with no overt evidence of multiple myeloma. PET/CT scan showed no evidence of bony lesions or lymphadenopathy. He had neuropathy with some of his treatment measures so is no longer on chemotherapy.Last oncology note indicates serologic remission.  He had TAVR in August 2020.  He is status post left common femoral to below knee popliteal artery bypass graft with PTFE and third toe amputation in May 2020.  He also underwent right femoropopliteal bypass in January 2001 but subsequently underwent right below the knee amputation due to poorly healing transmetatarsal amputation. He is doing reasonably well considering all his complex issues.  He denies left leg claudication and he has no open ulceration.  He  is getting adjusted to using a prosthesis.  No chest pain or worsening dyspnea.   Past Medical History:  Diagnosis Date  . Amyloidosis ([place])   . Anemia of chronic disease   . Anxiety   . CAD (coronary artery disease)    a. cardiac cath on 11/25/17 with occlusion of the RCA which could have been his event but the RCA could not be engaged. The RCA filled distally from left to right collaterals. There were no severe stenoses in the left coronary system. Medical therapy recommended, started on plavix.  . Chronic combined systolic and diastolic CHF (congestive heart failure) ([place])    a. EF dropped to 35-40% with grade 2 DD by echo 11/2017.  [name] DJD (degenerative joint disease), lumbar   . ESRD (end stage renal disease) ([place])    ESRD- HEMO MWF- [facility]  . GERD (gastroesophageal reflux disease)   . Hyperlipidemia   . Hypertension   . Myocardial infarction ([place])   . Neuropathy   . Peripheral vascular disease ([place])   . Pneumonia   . Restless legs   . S/P TAVR (transcatheter aortic valve replacement) 10/21/2018   26 mm Edwards Sapien 3 Ultra transcatheter heart valve placed via percutaneous right transfemoral approach   . Severe aortic stenosis    severe    Past Surgical History:  Procedure Laterality Date  . ABDOMINAL AORTOGRAM W/LOWER EXTREMITY Bilateral 07/16/2018   Procedure: ABDOMINAL AORTOGRAM W/LOWER EXTREMITY;  Surgeon: Wellington Hampshire, MD;  Location: [place] [facility];  Service: Cardiovascular;  Laterality: Bilateral;  . ABDOMINAL AORTOGRAM W/LOWER EXTREMITY N/A 04/09/2019  Procedure: ABDOMINAL AORTOGRAM W/LOWER EXTREMITY - Right Lower;  Surgeon: Waynetta Sandy, MD;  Location: [place] [facility];  Service: Cardiovascular;  Laterality: N/A;  . AMPUTATION Left 08/19/2018   Procedure: AMPUTATION LEFT TOES SECOND AND THIRD;  Surgeon: Waynetta Sandy, MD;  Location: [place];  Service: Vascular;  Laterality: Left;  . AMPUTATION Right 05/07/2019   Procedure: AMPUTATION  BELOW KNEE;  Surgeon: Serafina Mitchell, MD;  Location: [facility];  Service: Vascular;  Laterality: Right;  . AV FISTULA PLACEMENT Right 12/27/2015   Procedure: Right Arm ARTERIOVENOUS (AV) FISTULA CREATION;  Surgeon: Angelia Mould, MD;  Location: [place];  Service: Vascular;  Laterality: Right;  . CATARACT EXTRACTION W/ INTRAOCULAR LENS  IMPLANT, BILATERAL    . COLONOSCOPY    . ERCP W/ SPHICTEROTOMY    . FEMORAL-POPLITEAL BYPASS GRAFT Left 07/22/2018   Procedure: left FEMORAL TO BELOW KNEE POPLITEAL ARTERY BYPASS GREAFT using 88m removable ring propaten gore graft;  Surgeon: CWaynetta Sandy MD;  [place]  Service: Vascular;  Laterality: Left;  . FEMORAL-POPLITEAL BYPASS GRAFT Right 04/10/2019   Procedure: BYPASS GRAFT RIGHT FEMORAL-POPLITEAL ARTERY;  Surgeon: ERosetta Posner MD;  [place]  Service: Vascular;  Laterality: Right;  . I & D EXTREMITY Right 05/05/2019   Procedure: Revision of  RIGHT  FOOT Amputation;  Surgeon: CWaynetta Sandy MD;  [place] [facility]  Service: Vascular;  Laterality: Right;  . INTRAOPERATIVE TRANSTHORACIC ECHOCARDIOGRAM N/A 10/21/2018   Procedure: Intraoperative Transthoracic Echocardiogram;  Surgeon: CSherren Mocha MD;  [place]  Service: Open Heart Surgery;  Laterality: N/A;  . IR GENERIC HISTORICAL  01/27/2016   IR FLUORO GUIDE CV LINE RIGHT 01/27/2016 DArne Cleveland MD [facility]INTER[facility]  . IR GENERIC HISTORICAL  01/27/2016   IR [place] ACCESS RIGHT 01/27/2016 DArne Cleveland MD [facility]  . LAPAROSCOPIC CHOLECYSTECTOMY  03/2011  . LEFT HEART CATH AND CORONARY ANGIOGRAPHY N/A 11/25/2017   Procedure: LEFT HEART CATH AND CORONARY ANGIOGRAPHY;  Surgeon: JMartinique Peter M, MD;  [facility];  Service: Cardiovascular;  Laterality: N/A;  . LEFT HEART CATH AND CORONARY ANGIOGRAPHY N/A 07/24/2018   Procedure: LEFT HEART CATH AND CORONARY ANGIOGRAPHY;  Surgeon: CSherren Mocha MD;  [facility];   Service: Cardiovascular;  Laterality: N/A;  . RENAL BIOPSY, PERCUTANEOUS Right 09/08/2014  . RIGHT/LEFT HEART CATH AND CORONARY ANGIOGRAPHY N/A 03/25/2018   Procedure: RIGHT/LEFT HEART CATH AND CORONARY ANGIOGRAPHY;  Surgeon: JMartinique Peter M, MD;  [place];  Service: Cardiovascular;  Laterality: N/A;  . TEE WITHOUT CARDIOVERSION N/A 05/14/2019   Procedure: TRANSESOPHAGEAL ECHOCARDIOGRAM (TEE);  Surgeon: RFay Records MD;  [facility]  Service: Cardiovascular;  Laterality: N/A;  . TOE AMPUTATION Left 08/19/2018   2nd & 3rd toes  . TONSILLECTOMY  ~ 1960  . TRANSCATHETER AORTIC VALVE REPLACEMENT, TRANSFEMORAL N/A 10/21/2018   Procedure: TRANSCATHETER AORTIC VALVE REPLACEMENT, TRANSFEMORAL;  Surgeon: CSherren Mocha MD;  [place]  Service: Open Heart Surgery;  Laterality: N/A;  . TRANSMETATARSAL AMPUTATION Right 04/29/2019   Procedure: RIGHT TRANSMETATARSAL AMPUTATION;  Surgeon: CWaynetta Sandy MD;  [place]  Service: Vascular;  Laterality: Right;  . ULTRASOUND GUIDANCE FOR VASCULAR ACCESS  03/25/2018   Procedure: Ultrasound Guidance For Vascular Access;  Surgeon: JMartinique Peter M, MD;  [facility];  Service: Cardiovascular;;     Current Outpatient Medications  Medication Sig Dispense Refill  . acetaminophen (TYLENOL)  325 MG tablet Take 1-2 tablets (325-650 mg total) by mouth every 4 (four) hours as needed for mild pain (or temp >/= 101 F).    [name] amiodarone (PACERONE) 200 MG tablet Take 1 tablet (200 mg total) by mouth daily. 90 tablet 2  . atorvastatin (LIPITOR) 80 MG tablet Take 1 tablet (80 mg total) by mouth daily with supper. 90 tablet 0  . clopidogrel (PLAVIX) 75 MG tablet Take 1 tablet (75 mg total) by mouth daily with supper. 90 tablet 2  . Darbepoetin Alfa (ARANESP) 100 MCG/0.5ML SOSY injection Inject 0.5 mLs (100 mcg total) into the vein every Friday with hemodialysis. 4.2 mL   . Melatonin 3 MG TABS Take 0.5 tablets  (1.5 mg total) by mouth at bedtime. 30 tablet 0  . multivitamin (RENA-VIT) TABS tablet Take 1 tablet by mouth at bedtime. (Patient taking differently: Take 1 tablet by mouth daily with breakfast. ) 30 tablet 0  . oxyCODONE (OXY IR/ROXICODONE) 5 MG immediate release tablet Take 1-2 tablets (5-10 mg total) by mouth every 4 (four) hours as needed for moderate pain. 30 tablet 0  . pantoprazole (PROTONIX) 40 MG tablet Take 1 tablet (40 mg total) by mouth daily. 30 tablet 0  . sucroferric oxyhydroxide (VELPHORO) 500 MG chewable tablet Chew 3 tablets (1,500 mg total) by mouth 3 (three) times daily with meals. 120 tablet 0  . Vancomycin (VANCOCIN) 750-5 MG/150ML-% SOLN Inject 150 mLs (750 mg total) into the vein every Monday, Wednesday, and Friday with hemodialysis. 4000 mL 0  . vitamin B-12 (CYANOCOBALAMIN) 1000 MCG tablet Take 1 tablet (1,000 mcg total) by mouth every evening. 30 tablet 0  . calcitRIOL (ROCALTROL) 0.5 MCG capsule Take 3 capsules (1.5 mcg total) by mouth every Monday, Wednesday, and Friday with hemodialysis. (Patient not taking: Reported on 10/06/2019) 30 capsule 0  . docusate sodium (COLACE) 100 MG capsule Take 1 capsule (100 mg total) by mouth 2 (two) times daily. (Patient not taking: Reported on 10/06/2019) 10 capsule 0  . LORazepam (ATIVAN) 1 MG tablet Take 1 tablet (1 mg total) by mouth 2 (two) times daily as needed for anxiety. (Patient not taking: Reported on 10/06/2019) 20 tablet 0  . midodrine (PROAMATINE) 10 MG tablet Take 1 tablet (10 mg total) by mouth every Monday, Wednesday, and Friday with hemodialysis. (Patient not taking: Reported on 10/06/2019) 30 tablet 0   No current facility-administered medications for this visit.    Allergies:   Patient has no known allergies.    Social History:  The patient  reports that he quit smoking about 8 years ago. His smoking use included cigarettes. He has a 80.00 pack-year smoking history. He has never used smokeless tobacco. He reports that  he does not drink alcohol and does not use drugs.   Family History:  The patient's family history includes Diabetes in his father, mother, and sister; Heart disease in his mother; Hypertension in his father and mother; Skin cancer in his mother.    ROS:  Please see the history of present illness.   Otherwise, review of systems are positive for none.   All other systems are reviewed and negative.    PHYSICAL EXAM: VS:  BP (!) 121/58   Pulse 74   Ht _0  (1.778 m)   Wt 165 lb (74.8 kg)   SpO2 97%   BMI 23.68 kg/m  , BMI Body mass index is 23.68 kg/m. GEN: Well nourished, well developed, in no acute distress  HEENT: normal  Neck:  no JVD, or masses. Bilateral carotid bruits Cardiac: RRR; no rubs, or gallops,no edema . 2/6 systolic murmur at the aortic area Respiratory:  clear to auscultation bilaterally, normal work of breathing GI: soft, nontender, nondistended, + BS MS: no deformity or atrophy  Skin: warm and dry, no rash Neuro:  Strength and sensation are intact Psych: euthymic mood, full affect Vascular: Femoral pulses slightly diminished bilaterally.  Right below the knee amputation.   EKG:  EKG is not ordered today.    Recent Labs: 10/17/2018: B Natriuretic Peptide 1,967.8 10/18/2018: TSH 1.662 10/23/2018: Magnesium 2.2 04/29/2019: ALT 7 05/27/2019: BUN 27; Creatinine, Ser 7.39; Hemoglobin 8.2; Platelets 97; Potassium 4.1; Sodium 135    Lipid Panel    Component Value Date/Time   CHOL 117 02/12/2018 0823   TRIG 174 (H) 02/12/2018 0823   HDL 32 (L) 02/12/2018 0823   CHOLHDL 3.7 02/12/2018 0823   CHOLHDL 7.4 02/25/2014 0959   VLDL 40 02/25/2014 0959   LDLCALC 50 02/12/2018 0823      Wt Readings from Last 3 Encounters:  10/06/19 165 lb (74.8 kg)  10/06/19 160 lb (72.6 kg)  08/06/19 152 lb (68.9 kg)       No flowsheet data found.    ASSESSMENT AND PLAN:  1. Peripheral arterial disease with previous critical limb ischemia manifested by gangrenous toes on  the left side.  He is status post femoral-popliteal bypass and resection of affected toes with subsequent healing.  He is status post right BKA due to gangrene.  Most recent Doppler in April showed patent left femoral-popliteal bypass.  We have to monitor this closely.  Continue aggressive treatment of risk factors.  2. Coronary artery disease involving native coronary arteries: He is being treated medically with no significant angina.  3. Chronic systolic heart failure: Seems to be stable.  He is not able to tolerate heart failure medications due to hypotension during dialysis.  4. End-stage renal disease on hemodialysis Monday Wednesday Friday.  5. Hyperlipidemia: Continue high-dose atorvastatin.  6. Severe aortic stenosis: Status post TAVR in August 2020.  7.  History of nonsustained ventricular tachycardia post TAVR: The patient has been on amiodarone since then.  We should consider stopping amiodarone in the near future.    Disposition:   FU with me in 4 months  Signed,  Kathlyn Sacramento, MD  10/09/2019 1:57 PM    [place]

## 2019-10-06 NOTE — Progress Notes (Signed)
Subjective:    Patient ID: Jose Garrett, male    DOB: 06/10/1950, 69 y.o.   MRN: [id number]  TELEHEALTH NOTE  Due to national recommendations of social distancing due to COVID 19, an audio/video telehealth visit is felt to be most appropriate for this patient at this time.  See Chart message from today for the patient's consent to telehealth from [place].     I verified that I am speaking with the correct person using two identifiers.  Location of patient: Home Location of provider: Office Method of communication: MyChart Video visit Names of participants : Zorita Pang scheduling, Geryl Rankins obtaining consent and vitals if available Established patient  HPI 69 year old right-handed male with history of remote tobacco abuse end-stage renal disease due to cardiorenal syndrome in the setting of AL amyloidosis, aortic stenosis, hypertension CHF presents for follow up for debility secondary to fem-pop bypass complicated by NSTEMI. He has had LLE 2nd, 3rd digit amputation, right BKA.  Last clinic visit on 08/06/2019.  Since that time, patient states he started PT. He is going to follow up with Vascular regarding vein replacement. He is only limited wearing his prosthesis for limited purposes. He continues to have neuropathy.  He takes Tylenol for it and his back. He is taking Tizanidine with benefit.   Pain Inventory Average Pain 5 Pain Right Now 6 My pain is intermittent, stabbing, tingling and aching  In the last 24 hours, has pain interfered with the following? General activity 5 Relation with others 6 Enjoyment of life 5 What TIME of day is your pain at its worst? varies Sleep (in general) Poor  Pain is worse with: inactivity and unsure Pain improves with: therapy/exercise and medication Relief from Meds: 8  Mobility walk with assistance use a walker ability to climb steps?  no do you drive?  no use a  wheelchair  Function retired I need assistance with the following:  household duties and shopping  Neuro/Psych tremor tingling trouble walking spasms  Prior Studies Any changes since last visit?  no  Physicians involved in your care Any changes since last visit?  no   Family History  Problem Relation Age of Onset  . Hypertension Mother   . Diabetes Mother   . Heart disease Mother   . Skin cancer Mother   . Hypertension Father   . Diabetes Father   . Diabetes Sister    Social History   Socioeconomic History  . Marital status: Divorced    Spouse name: Not on file  . Number of children: Not on file  . Years of education: Not on file  . Highest education level: Not on file  Occupational History  . Not on file  Tobacco Use  . Smoking status: Former Smoker    Packs/day: 2.00    Years: 40.00    Pack years: 80.00    Types: Cigarettes    Quit date: 03/19/2011    Years since quitting: 8.5  . Smokeless tobacco: Never Used  Vaping Use  . Vaping Use: Never used  Substance and Sexual Activity  . Alcohol use: No    Alcohol/week: 0.0 standard drinks  . Drug use: No    Comment:  "I have used marijuana till the 1990's". Notes that he quit 15 yrs ago  . Sexual activity: Not Currently  Other Topics Concern  . Not on file  Social History Narrative   Pt lives alone in a 1 story home  Has 3 adult daughters   Highest level of education: associates degree   Worked in Runner, broadcasting/film/video.    Social Determinants of Health   Financial Resource Strain:   . Difficulty of Paying Living Expenses:   Food Insecurity:   . Worried About Charity fundraiser in the Last Year:   . Arboriculturist in the Last Year:   Transportation Needs:   . Film/video editor (Medical):   [name] Lack of Transportation (Non-Medical):   Physical Activity:   . Days of Exercise per Week:   . Minutes of Exercise per Session:   Stress:   . Feeling of Stress :   Social Connections:   . Frequency  of Communication with Friends and Family:   . Frequency of Social Gatherings with Friends and Family:   . Attends Religious Services:   . Active Member of Clubs or Organizations:   . Attends Archivist Meetings:   [name] Marital Status:    Past Surgical History:  Procedure Laterality Date  . ABDOMINAL AORTOGRAM W/LOWER EXTREMITY Bilateral 07/16/2018   Procedure: ABDOMINAL AORTOGRAM W/LOWER EXTREMITY;  Surgeon: Wellington Hampshire, MD;  Location: [facility];  Service: Cardiovascular;  Laterality: Bilateral;  . ABDOMINAL AORTOGRAM W/LOWER EXTREMITY N/A 04/09/2019   Procedure: ABDOMINAL AORTOGRAM W/LOWER EXTREMITY - Right Lower;  Surgeon: Waynetta Sandy, MD;  Location: [facility];  Service: Cardiovascular;  Laterality: N/A;  . AMPUTATION Left 08/19/2018   Procedure: AMPUTATION LEFT TOES SECOND AND THIRD;  Surgeon: Waynetta Sandy, MD;  Location: [facility];  Service: Vascular;  Laterality: Left;  . AMPUTATION Right 05/07/2019   Procedure: AMPUTATION BELOW KNEE;  Surgeon: Serafina Mitchell, MD;  Location: [facility];  Service: Vascular;  Laterality: Right;  . AV FISTULA PLACEMENT Right 12/27/2015   Procedure: Right Arm ARTERIOVENOUS (AV) FISTULA CREATION;  Surgeon: Angelia Mould, MD;  Location: [facility];  Service: Vascular;  Laterality: Right;  . CATARACT EXTRACTION W/ INTRAOCULAR LENS  IMPLANT, BILATERAL    . COLONOSCOPY    . ERCP W/ SPHICTEROTOMY    . FEMORAL-POPLITEAL BYPASS GRAFT Left 07/22/2018   Procedure: left FEMORAL TO BELOW KNEE POPLITEAL ARTERY BYPASS GREAFT using 2mm removable ring propaten gore graft;  Surgeon: Waynetta Sandy, MD;  Location: [facility];  Service: Vascular;  Laterality: Left;  . FEMORAL-POPLITEAL BYPASS GRAFT Right 04/10/2019   Procedure: BYPASS GRAFT RIGHT FEMORAL-POPLITEAL ARTERY;  Surgeon: Rosetta Posner, MD;  Location: [facility];  Service: Vascular;  Laterality: Right;  . I & D EXTREMITY Right 05/05/2019   Procedure: Revision of  RIGHT   FOOT Amputation;  Surgeon: Waynetta Sandy, MD;  Location: [facility];  Service: Vascular;  Laterality: Right;  . INTRAOPERATIVE TRANSTHORACIC ECHOCARDIOGRAM N/A 10/21/2018   Procedure: Intraoperative Transthoracic Echocardiogram;  Surgeon: Sherren Mocha, MD;  Location: [facility];  Service: Open Heart Surgery;  Laterality: N/A;  . IR GENERIC HISTORICAL  01/27/2016   IR FLUORO GUIDE CV LINE RIGHT 01/27/2016 Arne Cleveland, MD [facility]  . IR GENERIC HISTORICAL  01/27/2016   IR US GUIDE VASC ACCESS RIGHT 01/27/2016 Arne Cleveland, MD [facility]  . LAPAROSCOPIC CHOLECYSTECTOMY  03/2011  . LEFT HEART CATH AND CORONARY ANGIOGRAPHY N/A 11/25/2017   Procedure: LEFT HEART CATH AND CORONARY ANGIOGRAPHY;  Surgeon: Martinique, Peter M, MD;  Location: [facility];  Service: Cardiovascular;  Laterality: N/A;  . LEFT HEART CATH AND CORONARY ANGIOGRAPHY N/A 07/24/2018   Procedure: LEFT HEART CATH AND  CORONARY ANGIOGRAPHY;  Surgeon: Sherren Mocha, MD;  Location: [facility];  Service: Cardiovascular;  Laterality: N/A;  . RENAL BIOPSY, PERCUTANEOUS Right 09/08/2014  . RIGHT/LEFT HEART CATH AND CORONARY ANGIOGRAPHY N/A 03/25/2018   Procedure: RIGHT/LEFT HEART CATH AND CORONARY ANGIOGRAPHY;  Surgeon: Martinique, Peter M, MD;  Location: [facility];  Service: Cardiovascular;  Laterality: N/A;  . TEE WITHOUT CARDIOVERSION N/A 05/14/2019   Procedure: TRANSESOPHAGEAL ECHOCARDIOGRAM (TEE);  Surgeon: Fay Records, MD;  Location: [facility]OSCOPY;  Service: Cardiovascular;  Laterality: N/A;  . TOE AMPUTATION Left 08/19/2018   2nd & 3rd toes  . TONSILLECTOMY  ~ 1960  . TRANSCATHETER AORTIC VALVE REPLACEMENT, TRANSFEMORAL N/A 10/21/2018   Procedure: TRANSCATHETER AORTIC VALVE REPLACEMENT, TRANSFEMORAL;  Surgeon: Sherren Mocha, MD;  Location: [facility];  Service: Open Heart Surgery;  Laterality: N/A;  . TRANSMETATARSAL AMPUTATION Right 04/29/2019   Procedure: RIGHT TRANSMETATARSAL AMPUTATION;  Surgeon: Waynetta Sandy, MD;  Location: [facility];  Service: Vascular;  Laterality: Right;  . ULTRASOUND GUIDANCE FOR VASCULAR ACCESS  03/25/2018   Procedure: Ultrasound Guidance For Vascular Access;  Surgeon: Martinique, Peter M, MD;  Location: [facility];  Service: Cardiovascular;;   Past Medical History:  Diagnosis Date  . Amyloidosis ([place])   . Anemia of chronic disease   . Anxiety   . CAD (coronary artery disease)    a. cardiac cath on 11/25/17 with occlusion of the RCA which could have been his event but the RCA could not be engaged. The RCA filled distally from left to right collaterals. There were no severe stenoses in the left coronary system. Medical therapy recommended, started on plavix.  . Chronic combined systolic and diastolic CHF (congestive heart failure) ([place])    a. EF dropped to 35-40% with grade 2 DD by echo 11/2017.  [name] DJD (degenerative joint disease), lumbar   . ESRD (end stage renal disease) ([place])    ESRD- HEMO MWF- [facility]  . GERD (gastroesophageal reflux disease)   . Hyperlipidemia   . Hypertension   . Myocardial infarction ([place])   . Neuropathy   . Peripheral vascular disease ([place])   . Pneumonia   . Restless legs   . S/P TAVR (transcatheter aortic valve replacement) 10/21/2018   26 mm Edwards Sapien 3 Ultra transcatheter heart valve placed via percutaneous right transfemoral approach   . Severe aortic stenosis    severe   Ht 5\' 10"  (1.778 m)   Wt 160 lb (72.6 kg) Comment: patient reported  BMI 22.96 kg/m   Opioid Risk Score:   Fall Risk Score:  `1  Depression screen PHQ 2/9  Depression screen [facility] 2/9 08/06/2019 07/13/2019 06/11/2019 04/22/2019 10/09/2018 09/11/2018 08/21/2018  Decreased Interest 0 0 0 0 0 1 1  Down, Depressed, Hopeless 0 1 0 1 0 1 1  PHQ - 2 Score 0 1 0 1 0 2 2  Altered sleeping - 2 0 - 3 0 2  Tired, decreased energy - 1 3 - 2 1 2   Change in appetite - 0 0 - 0 0 0  Feeling bad or failure about yourself  - 0 0 - 0 0 1  Trouble concentrating -  1 0 - 0 0 1  Moving slowly or fidgety/restless - 0 0 - 0 1 1  Suicidal thoughts - 0 0 - 0 0 0  PHQ-9 Score - 5 3 - 5 4 9   Difficult doing work/chores - Somewhat difficult - - Somewhat difficult Somewhat difficult  Somewhat difficult  Some recent data might be hidden    Review of Systems  Constitutional: Negative.   HENT: Negative.   Eyes: Negative.   Respiratory: Negative.   Cardiovascular: Negative.   Gastrointestinal: Positive for abdominal pain, diarrhea and nausea.  Endocrine: Negative.   Genitourinary: Negative.   Musculoskeletal: Positive for back pain and gait problem.       Spasms  Skin: Negative.   Allergic/Immunologic: Negative.   Neurological: Positive for tremors.       Tingling  Hematological: Negative.   Psychiatric/Behavioral: Negative.   All other systems reviewed and are negative.      Objective:   Physical Exam Constitutional: NAD. Vital signs reviewed. HENT: Normocephalic. Atraumatic. Neurological:  Alert     Assessment & Plan:  Right-handed male with history of remote tobacco abuse end-stage renal disease due to cardiorenal syndrome in the setting of AL amyloidosis, aortic stenosis, hypertension CHF presents for follow up for debility secondary to fem-pop bypass complicated by NSTEMI, LLE 2,3rd digit amputation, right BKA.   1.Debilitysecondary to PAD status post left common femoral to below-knee popliteal artery bypass 4/80/1655 complicated by non-STEMI with subsequent LLE 2nd, 3rd digit amputation and right BKA             Continue therapies             Continue follow up with Vascular- plans for ?vein replacement LLE, workup planned in the next week  Encouraged use of prosthesis  2. Peripheral Neuropathy:             States no benefit with Gabapentin             Lidocaine patch with minimal benefit             Oxycodone as needed per ?PCP, encouraged wean  Cont tylenol prn  3. End-stage renal disease.              Continues to have sig  edema in LE             Recs per Nephro  4. Gait abnormality             Cont therapies  Encouraged use of prosthesis             See #1  5. Back spasms             Positional with prolonged sitting             Continue Tizanidine 2mg  daily PRN, Robaxin not covered by insurance (using ~2/week)

## 2019-10-06 NOTE — Progress Notes (Signed)
Based on what you shared with me, I feel your condition warrants further evaluation and I recommend that you be seen for a face to face office visit.   NOTE: If you entered your credit card information for this eVisit, you will not be charged. You may see a "hold" on your card for the $35 but that hold will drop off and you will not have a charge processed.   If you are having a true medical emergency please call 911.      For an urgent face to face visit, [place] has five urgent care centers for your convenience:      NEW:  [facility] [place] [facility] [place] Get Driving Directions [phone] [place], [place] [address] . 10 am - 6pm Monday - Friday    [facility] [place] [facility]) Get Driving Directions [phone] [address] [place], [place] [address] . 10 am to 8 pm Monday-Friday . 12 pm to 8 pm [facility] Get Driving Directions [phone] [place], [place], [place] [address] . 8 am to 8 pm Monday-Friday . 9 am to 6 pm Saturday . 11 am to 6 pm Sunday     [facility] Get Driving Directions  [phone] [address] [place], [place] [address] . 8 am to 8 pm Monday-Friday . 8 am to 4 pm [facility] at [place] Get Driving Directions [phone] [address] [place], [place] [address] . 12 pm to 6 pm Monday-Friday      Your e-visit answers were reviewed by a board certified advanced clinical practitioner to complete your personal care plan.  Thank you for using e-Visits.

## 2019-10-06 NOTE — Patient Instructions (Signed)
Medication Instructions:  No changes *If you need a refill on your cardiac medications before your next appointment, please call your pharmacy*   Lab Work: None ordered If you have labs (blood work) drawn today and your tests are completely normal, you will receive your results only by: MyChart Message (if you have MyChart) OR A paper copy in the mail If you have any lab test that is abnormal or we need to change your treatment, we will call you to review the results.   Testing/Procedures: None ordered   Follow-Up: At [facility], you and your health needs are our priority.  As part of our continuing mission to provide you with exceptional heart care, we have created designated Provider Care Teams.  These Care Teams include your primary Cardiologist (physician) and Advanced Practice Providers (APPs -  Physician Assistants and Nurse Practitioners) who all work together to provide you with the care you need, when you need it.  We recommend signing up for the patient portal called "MyChart".  Sign up information is provided on this After Visit Summary.  MyChart is used to connect with patients for Virtual Visits (Telemedicine).  Patients are able to view lab/test results, encounter notes, upcoming appointments, etc.  Non-urgent messages can be sent to your provider as well.   To learn more about what you can do with MyChart, go to [email or web address].    Your next appointment:   4 month(s)  The format for your next appointment:   In Person  Provider:   Muhammad Arida, MD     

## 2019-10-07 DIAGNOSIS — D509 Iron deficiency anemia, unspecified: Secondary | ICD-10-CM | POA: Diagnosis not present

## 2019-10-07 DIAGNOSIS — N2581 Secondary hyperparathyroidism of renal origin: Secondary | ICD-10-CM | POA: Diagnosis not present

## 2019-10-07 DIAGNOSIS — Z992 Dependence on renal dialysis: Secondary | ICD-10-CM | POA: Diagnosis not present

## 2019-10-07 DIAGNOSIS — D688 Other specified coagulation defects: Secondary | ICD-10-CM | POA: Diagnosis not present

## 2019-10-07 DIAGNOSIS — D631 Anemia in chronic kidney disease: Secondary | ICD-10-CM | POA: Diagnosis not present

## 2019-10-07 DIAGNOSIS — N186 End stage renal disease: Secondary | ICD-10-CM | POA: Diagnosis not present

## 2019-10-08 ENCOUNTER — Ambulatory Visit (INDEPENDENT_AMBULATORY_CARE_PROVIDER_SITE_OTHER): Payer: Medicare Other | Admitting: Physical Therapy

## 2019-10-08 ENCOUNTER — Encounter: Payer: Self-pay | Admitting: Physical Therapy

## 2019-10-08 ENCOUNTER — Other Ambulatory Visit: Payer: Self-pay

## 2019-10-08 DIAGNOSIS — R293 Abnormal posture: Secondary | ICD-10-CM | POA: Diagnosis not present

## 2019-10-08 DIAGNOSIS — M6281 Muscle weakness (generalized): Secondary | ICD-10-CM | POA: Diagnosis not present

## 2019-10-08 DIAGNOSIS — M25661 Stiffness of right knee, not elsewhere classified: Secondary | ICD-10-CM | POA: Diagnosis not present

## 2019-10-08 DIAGNOSIS — R2689 Other abnormalities of gait and mobility: Secondary | ICD-10-CM | POA: Diagnosis not present

## 2019-10-08 DIAGNOSIS — R2681 Unsteadiness on feet: Secondary | ICD-10-CM | POA: Diagnosis not present

## 2019-10-08 DIAGNOSIS — M25662 Stiffness of left knee, not elsewhere classified: Secondary | ICD-10-CM

## 2019-10-08 NOTE — Therapy (Signed)
[facility] Physical Therapy [address] [place], [place], [address] Phone: [phone]   Fax:  [phone]  Physical Therapy Treatment  Patient Details  Name: Jose Garrett MRN: [id number] Date of Birth: 1950/05/26 Referring Provider (PT): Curt Jews, MD   Encounter Date: 10/08/2019   PT End of Session - 10/08/19 1522    Visit Number 4    Number of Visits 25    Date for PT Re-Evaluation 12/21/19    Authorization Type [employer] Medicare    Authorization Time Period $30 co-pay    PT Start Time 1435    PT Stop Time 1515    PT Time Calculation (min) 40 min    Equipment Utilized During Treatment Gait belt    Activity Tolerance Patient tolerated treatment well    Behavior During Therapy [facility] for tasks assessed/performed           Past Medical History:  Diagnosis Date  . Amyloidosis ([place])   . Anemia of chronic disease   . Anxiety   . CAD (coronary artery disease)    a. cardiac cath on 11/25/17 with occlusion of the RCA which could have been his event but the RCA could not be engaged. The RCA filled distally from left to right collaterals. There were no severe stenoses in the left coronary system. Medical therapy recommended, started on plavix.  . Chronic combined systolic and diastolic CHF (congestive heart failure) ([place])    a. EF dropped to 35-40% with grade 2 DD by echo 11/2017.  [name] DJD (degenerative joint disease), lumbar   . ESRD (end stage renal disease) ([place])    ESRD- HEMO MWF- [employer]  . GERD (gastroesophageal reflux disease)   . Hyperlipidemia   . Hypertension   . Myocardial infarction ([place])   . Neuropathy   . Peripheral vascular disease ([place])   . Pneumonia   . Restless legs   . S/P TAVR (transcatheter aortic valve replacement) 10/21/2018   26 mm Edwards Sapien 3 Ultra transcatheter heart valve placed via percutaneous right transfemoral approach   . Severe aortic stenosis    severe    Past Surgical History:  Procedure Laterality Date  .  ABDOMINAL AORTOGRAM W/LOWER EXTREMITY Bilateral 07/16/2018   Procedure: ABDOMINAL AORTOGRAM W/LOWER EXTREMITY;  Surgeon: Wellington Hampshire, MD;  Location: [place] [facility];  Service: Cardiovascular;  Laterality: Bilateral;  . ABDOMINAL AORTOGRAM W/LOWER EXTREMITY N/A 04/09/2019   Procedure: ABDOMINAL AORTOGRAM W/LOWER EXTREMITY - Right Lower;  Surgeon: Waynetta Sandy, MD;  Location: [place] [facility];  Service: Cardiovascular;  Laterality: N/A;  . AMPUTATION Left 08/19/2018   Procedure: AMPUTATION LEFT TOES SECOND AND THIRD;  Surgeon: Waynetta Sandy, MD;  Location: [place];  Service: Vascular;  Laterality: Left;  . AMPUTATION Right 05/07/2019   Procedure: AMPUTATION BELOW KNEE;  Surgeon: Serafina Mitchell, MD;  Location: [facility] OR;  Service: Vascular;  Laterality: Right;  . AV FISTULA PLACEMENT Right 12/27/2015   Procedure: Right Arm ARTERIOVENOUS (AV) FISTULA CREATION;  Surgeon: Angelia Mould, MD;  Location: [place];  Service: Vascular;  Laterality: Right;  . CATARACT EXTRACTION W/ INTRAOCULAR LENS  IMPLANT, BILATERAL    . COLONOSCOPY    . ERCP W/ SPHICTEROTOMY    . FEMORAL-POPLITEAL BYPASS GRAFT Left 07/22/2018   Procedure: left FEMORAL TO BELOW KNEE POPLITEAL ARTERY BYPASS GREAFT using 24m removable ring propaten gore graft;  Surgeon: CWaynetta Sandy MD;  Location[place]  Service: Vascular;  Laterality: Left;  . FEMORAL-POPLITEAL BYPASS GRAFT  Right 04/10/2019   Procedure: BYPASS GRAFT RIGHT FEMORAL-POPLITEAL ARTERY;  Surgeon: Rosetta Posner, MD;  Location: [place];  Service: Vascular;  Laterality: Right;  . I & D EXTREMITY Right 05/05/2019   Procedure: Revision of  RIGHT  FOOT Amputation;  Surgeon: Waynetta Sandy, MD;  Location: [place];  Service: Vascular;  Laterality: Right;  . INTRAOPERATIVE TRANSTHORACIC ECHOCARDIOGRAM N/A 10/21/2018   Procedure: Intraoperative Transthoracic Echocardiogram;  Surgeon: Sherren Mocha, MD;  Location: [place];  Service: Open  Heart Surgery;  Laterality: N/A;  . IR GENERIC HISTORICAL  01/27/2016   IR FLUORO GUIDE CV LINE RIGHT 01/27/2016 Arne Cleveland, MD [facility]  . IR GENERIC HISTORICAL  01/27/2016   IR US GUIDE VASC ACCESS RIGHT 01/27/2016 Arne Cleveland, MD [facility]  . LAPAROSCOPIC CHOLECYSTECTOMY  03/2011  . LEFT HEART CATH AND CORONARY ANGIOGRAPHY N/A 11/25/2017   Procedure: LEFT HEART CATH AND CORONARY ANGIOGRAPHY;  Surgeon: Martinique, Peter M, MD;  Location: [place] [facility];  Service: Cardiovascular;  Laterality: N/A;  . LEFT HEART CATH AND CORONARY ANGIOGRAPHY N/A 07/24/2018   Procedure: LEFT HEART CATH AND CORONARY ANGIOGRAPHY;  Surgeon: Sherren Mocha, MD;  Location: [place] [facility];  Service: Cardiovascular;  Laterality: N/A;  . RENAL BIOPSY, PERCUTANEOUS Right 09/08/2014  . RIGHT/LEFT HEART CATH AND CORONARY ANGIOGRAPHY N/A 03/25/2018   Procedure: RIGHT/LEFT HEART CATH AND CORONARY ANGIOGRAPHY;  Surgeon: Martinique, Peter M, MD;  Location: [place] [facility];  Service: Cardiovascular;  Laterality: N/A;  . TEE WITHOUT CARDIOVERSION N/A 05/14/2019   Procedure: TRANSESOPHAGEAL ECHOCARDIOGRAM (TEE);  Surgeon: Fay Records, MD;  Location[facility] ENDOSCOPY;  Service: Cardiovascular;  Laterality: N/A;  . TOE AMPUTATION Left 08/19/2018   2nd & 3rd toes  . TONSILLECTOMY  ~ 1960  . TRANSCATHETER AORTIC VALVE REPLACEMENT, TRANSFEMORAL N/A 10/21/2018   Procedure: TRANSCATHETER AORTIC VALVE REPLACEMENT, TRANSFEMORAL;  Surgeon: Sherren Mocha, MD;  Location: [place];  Service: Open Heart Surgery;  Laterality: N/A;  . TRANSMETATARSAL AMPUTATION Right 04/29/2019   Procedure: RIGHT TRANSMETATARSAL AMPUTATION;  Surgeon: Waynetta Sandy, MD;  Location: [place];  Service: Vascular;  Laterality: Right;  . ULTRASOUND GUIDANCE FOR VASCULAR ACCESS  03/25/2018   Procedure: Ultrasound Guidance For Vascular Access;  Surgeon: Martinique, Peter M, MD;  Location: [place] [facility];  Service: Cardiovascular;;    There were no  vitals filed for this visit.   Subjective Assessment - 10/08/19 1451    Subjective relays he wears prosthesis about 2-3 hours at a time    Patient is accompained by: Family member   dtr, Alysia Penna   Pertinent History Rt TTA, left 2nd & 3rd toe amp 08/19/2018, ESRD, HTN, CHF, aortic stenosis, left fem-pop bypass, peripheral neuropathy, back spasms,    Patient Stated Goals use prosthesis to be active in community, working on car, day travels, walks    Pain Onset More than a month ago    Pain Onset More than a month ago                             [facility] Adult PT Treatment/Exercise - 10/08/19 0001      Transfers   Sit to Stand 5: Supervision;With upper extremity assist;With armrests;From chair/3-in-1;Other (comment)    Stand to Sit 5: Supervision;With upper extremity assist;With armrests;To chair/3-in-1;Other (comment)    Comments 2 sets of 5 sit to stands from chair and airex pad, no UE support  Ambulation/Gait   Ambulation/Gait Yes    Ambulation/Gait Assistance 5: Supervision    Ambulation Distance (Feet) 150 Feet    Assistive device Rolling walker;Prosthesis    Gait Pattern Step-to pattern;Decreased step length - left;Decreased stance time - right;Decreased stride length;Decreased hip/knee flexion - right;Decreased weight shift to right;Right hip hike;Right flexed knee in stance;Left flexed knee in stance;Antalgic;Lateral hip instability;Trunk flexed;Abducted- right      Neuro Re-ed    Neuro Re-ed Details  balance: anteriror and posterior weight shifrts, anterior reaching, fwd reaching placing object on stool in front of him all without UE support in RW. Then allowed one UE support of RW to pick up objects from floor. Then balance on foam with head turns. Then ambulating in bars with only one UE support      Prosthetics   Prosthetic Care Comments  educated to increase overall wear time each day by about 30 minutes if he can tolerate.    Current prosthetic wear  tolerance (days/week)  2 of last 4 days    Current prosthetic wear tolerance (#hours/day)  2 hours                    PT Short Term Goals - 10/08/19 1459      PT SHORT TERM GOAL #1   Title Patient demonstrates proper donning of prosthesis & verbalizes proper cleaning.  (All STGs Target Date: 10/23/2019)    Time 4    Period Weeks    Status On-going    Target Date 10/23/19      PT SHORT TERM GOAL #2   Title Patient tolerates prosthesis wear >8 hours total / day with no increased skin issues    Time 4    Period Weeks    Status On-going    Target Date 10/23/19      PT SHORT TERM GOAL #3   Title Standing balance without UE support reaching 5" anteriorly without UE support and picks up item from floor with RW support.    Baseline met 5 inch reach and picked up object on 7/29    Time 4    Period Weeks    Status Achieved    Target Date 10/23/19      PT SHORT TERM GOAL #4   Title Patient ambulates 150' with RW & prosthesis with supervision.    Baseline Met 7/29    Time 4    Period Weeks    Status Achieved    Target Date 10/23/19      PT SHORT TERM GOAL #5   Title Patient negotiates ramps & curbs with rolling walker and stairs with 2 rails with prosthesis with minA.    Time 4    Period Weeks    Status On-going    Target Date 10/23/19             PT Long Term Goals - 09/29/19 2214      PT LONG TERM GOAL #1   Title Patient verbalizes & demonstrates understanding of proper prosthetic care to enable safe utilization of prosthesis.  (All LTGs Target Date: 12/18/2019)    Time 12    Period Weeks    Status On-going    Target Date 12/18/19      PT LONG TERM GOAL #2   Title Patient tolerates prosthesis wear >90% of awake hours without skin issues & limb pain </= 2/10 to enable function throughout his day.    Time 12    Period Weeks  Status On-going    Target Date 12/18/19      PT LONG TERM GOAL #3   Title Berg Balance with prosthesis >/= 45/56 indicating lower  fall risk.    Time 12    Period Weeks    Status On-going    Target Date 12/18/19      PT LONG TERM GOAL #4   Title Patient ambulates 500' with cane or less & prosthesis modified independent for community mobility.    Time 12    Period Weeks    Status On-going    Target Date 12/18/19      PT LONG TERM GOAL #5   Title Patient negotiates ramps, curbs & stairs with single rail with cane or less and prosthesis modified independent to enable community access.    Time 12    Period Weeks    Status On-going    Target Date 12/18/19      PT LONG TERM GOAL #6   Title Patient reports back & residual limb pain increases </= 2 increments on 0-10 scale with standing & gait activities.    Time 12    Period Weeks    Status On-going    Target Date 12/18/19                 Plan - 10/08/19 1512    Clinical Impression Statement He has achieved 2/4 Short term goals and is progressing towards others. Session focuse on LE strength, balance, and prosthetic training. He was encouraged to increase his overall prosthethic wear time at home    Personal Factors and Comorbidities Comorbidity 3+;Fitness;Time since onset of injury/illness/exacerbation;Past/Current Experience    Comorbidities Rt TTA, left 2nd & 3rd toe amp 08/19/2018, ESRD, HTN, CHF, aortic stenosis, left fem-pop bypass, peripheral neuropathy, back spasms    Examination-Activity Limitations Lift;Locomotion Level;Stairs;Stand;Transfers    Examination-Participation Restrictions Community Activity;Shop    Stability/Clinical Decision Making Evolving/Moderate complexity    Rehab Potential Good    PT Frequency 2x / week    PT Duration 12 weeks    PT Treatment/Interventions ADLs/Self Care Home Management;Canalith Repostioning;DME Instruction;Gait training;Stair training;Functional mobility training;Therapeutic activities;Therapeutic exercise;Balance training;Neuromuscular re-education;Patient/family education;Prosthetic Training;Manual  techniques;Scar mobilization;Passive range of motion;Dry needling;Joint Manipulations;Vestibular    PT Next Visit Plan review prosthetic care paticularly cleaining , review Prosthetic gait instructing in ramps, curbs & stairs with RW,    PT Home Exercise Plan .ampsink, see pt instructions section on 09/24/19 note    Consulted and Agree with Plan of Care Patient;Family member/caregiver    Family Member Consulted Daughter, Alysia Penna           Patient will benefit from skilled therapeutic intervention in order to improve the following deficits and impairments:  Abnormal gait, Decreased activity tolerance, Decreased balance, Decreased endurance, Decreased knowledge of use of DME, Decreased mobility, Decreased range of motion, Decreased skin integrity, Decreased scar mobility, Decreased strength, Dizziness, Increased edema, Postural dysfunction, Prosthetic Dependency, Pain  Visit Diagnosis: Other abnormalities of gait and mobility  Muscle weakness (generalized)  Unsteadiness on feet  Abnormal posture  Stiffness of right knee, not elsewhere classified  Stiffness of left knee, not elsewhere classified     Problem List Patient Active Problem List   Diagnosis Date Noted  . Neuropathy 10/06/2019  . Gait abnormality 08/06/2019  . Back muscle spasm 07/09/2019  . Sleep disturbance   . Nausea without vomiting   . Orthostatic hypotension   . Acute on chronic anemia   . Chronic combined systolic and diastolic congestive  heart failure ([place])   . ESRD on dialysis ([place])   . Right below-knee amputee ([place]) 05/18/2019  . Pressure injury of skin 05/14/2019  . Enterococcal bacteremia 05/13/2019  . Polymorphic ventricular tachycardia ([place]) 10/24/2018  . S/P TAVR (transcatheter aortic valve replacement) 10/21/2018  . Anxiety   . Coronary artery disease involving native coronary artery of native heart without angina pectoris   . Thrombocytopenia ([place])   . PAD (peripheral artery disease) ([place])  07/22/2018  . Acute on chronic combined systolic and diastolic CHF (congestive heart failure) ([place]) 11/30/2017  . ESRD (end stage renal disease) on dialysis ([place])   . Iron deficiency anemia 11/22/2015  . AL amyloidosis ([place]) 09/25/2014  . Anemia of chronic disease 09/25/2014  . Essential hypertension, benign 11/06/2013    Silvestre Mesi 10/08/2019, 3:23 PM  [facility] [address] [place], [place], [address] Phone: [phone]   Fax:  [phone]  Name: Jose Garrett MRN: [id number] Date of Birth: Jan 03, 1951

## 2019-10-09 DIAGNOSIS — D688 Other specified coagulation defects: Secondary | ICD-10-CM | POA: Diagnosis not present

## 2019-10-09 DIAGNOSIS — Z992 Dependence on renal dialysis: Secondary | ICD-10-CM | POA: Diagnosis not present

## 2019-10-09 DIAGNOSIS — D631 Anemia in chronic kidney disease: Secondary | ICD-10-CM | POA: Diagnosis not present

## 2019-10-09 DIAGNOSIS — N186 End stage renal disease: Secondary | ICD-10-CM | POA: Diagnosis not present

## 2019-10-09 DIAGNOSIS — N2581 Secondary hyperparathyroidism of renal origin: Secondary | ICD-10-CM | POA: Diagnosis not present

## 2019-10-09 DIAGNOSIS — D509 Iron deficiency anemia, unspecified: Secondary | ICD-10-CM | POA: Diagnosis not present

## 2019-10-10 DIAGNOSIS — I129 Hypertensive chronic kidney disease with stage 1 through stage 4 chronic kidney disease, or unspecified chronic kidney disease: Secondary | ICD-10-CM | POA: Diagnosis not present

## 2019-10-10 DIAGNOSIS — Z992 Dependence on renal dialysis: Secondary | ICD-10-CM | POA: Diagnosis not present

## 2019-10-10 DIAGNOSIS — N186 End stage renal disease: Secondary | ICD-10-CM | POA: Diagnosis not present

## 2019-10-15 ENCOUNTER — Encounter: Payer: Self-pay | Admitting: Rehabilitative and Restorative Service Providers"

## 2019-10-15 ENCOUNTER — Other Ambulatory Visit: Payer: Self-pay

## 2019-10-15 ENCOUNTER — Ambulatory Visit (INDEPENDENT_AMBULATORY_CARE_PROVIDER_SITE_OTHER): Payer: Medicare Other | Admitting: Rehabilitative and Restorative Service Providers"

## 2019-10-15 DIAGNOSIS — Z89511 Acquired absence of right leg below knee: Secondary | ICD-10-CM | POA: Diagnosis not present

## 2019-10-15 DIAGNOSIS — I739 Peripheral vascular disease, unspecified: Secondary | ICD-10-CM | POA: Diagnosis not present

## 2019-10-15 DIAGNOSIS — R262 Difficulty in walking, not elsewhere classified: Secondary | ICD-10-CM

## 2019-10-15 DIAGNOSIS — R2681 Unsteadiness on feet: Secondary | ICD-10-CM | POA: Diagnosis not present

## 2019-10-15 DIAGNOSIS — M6281 Muscle weakness (generalized): Secondary | ICD-10-CM

## 2019-10-15 DIAGNOSIS — R2689 Other abnormalities of gait and mobility: Secondary | ICD-10-CM

## 2019-10-15 NOTE — Therapy (Signed)
[facility] Physical Therapy [address] [place], [place], [address] Phone: [phone]   Fax:  [phone]  Physical Therapy Treatment  Patient Details  Name: Jose Garrett MRN: [id number] Date of Birth: January 20, 1951 Referring Provider (PT): Gretta Began, MD   Encounter Date: 10/15/2019   PT End of Session - 10/15/19 1631    Visit Number 5    Number of Visits 25    Date for PT Re-Evaluation 12/21/19    Authorization Type [employer] Medicare    Authorization Time Period $30 co-pay    PT Start Time 1400   arrived late for a 1345 appointment time - PT unable to accommodate   PT Stop Time 1428    PT Time Calculation (min) 28 min    Equipment Utilized During Treatment Gait belt    Activity Tolerance Patient tolerated treatment well    Behavior During Therapy [facility] for tasks assessed/performed           Past Medical History:  Diagnosis Date  . Amyloidosis (HCC)   . Anemia of chronic disease   . Anxiety   . CAD (coronary artery disease)    a. cardiac cath on 11/25/17 with occlusion of the RCA which could have been his event but the RCA could not be engaged. The RCA filled distally from left to right collaterals. There were no severe stenoses in the left coronary system. Medical therapy recommended, started on plavix.  . Chronic combined systolic and diastolic CHF (congestive heart failure) (HCC)    a. EF dropped to 35-40% with grade 2 DD by echo 11/2017.  [name] DJD (degenerative joint disease), lumbar   . ESRD (end stage renal disease) (HCC)    ESRD- HEMO MWF- [facility]  . GERD (gastroesophageal reflux disease)   . Hyperlipidemia   . Hypertension   . Myocardial infarction (HCC)   . Neuropathy   . Peripheral vascular disease (HCC)   . Pneumonia   . Restless legs   . S/P TAVR (transcatheter aortic valve replacement) 10/21/2018   26 mm Edwards Sapien 3 Ultra transcatheter heart valve placed via percutaneous right transfemoral approach   . Severe aortic stenosis     severe    Past Surgical History:  Procedure Laterality Date  . ABDOMINAL AORTOGRAM W/LOWER EXTREMITY Bilateral 07/16/2018   Procedure: ABDOMINAL AORTOGRAM W/LOWER EXTREMITY;  Surgeon: Iran Ouch, MD;  Location: [facility] INVASIVE CV LAB;  Service: Cardiovascular;  Laterality: Bilateral;  . ABDOMINAL AORTOGRAM W/LOWER EXTREMITY N/A 04/09/2019   Procedure: ABDOMINAL AORTOGRAM W/LOWER EXTREMITY - Right Lower;  Surgeon: Maeola Harman, MD;  Location: [facility] INVASIVE CV LAB;  Service: Cardiovascular;  Laterality: N/A;  . AMPUTATION Left 08/19/2018   Procedure: AMPUTATION LEFT TOES SECOND AND THIRD;  Surgeon: Maeola Harman, MD;  Location: [facility] OR;  Service: Vascular;  Laterality: Left;  . AMPUTATION Right 05/07/2019   Procedure: AMPUTATION BELOW KNEE;  Surgeon: Nada Libman, MD;  Location: [facility] OR;  Service: Vascular;  Laterality: Right;  . AV FISTULA PLACEMENT Right 12/27/2015   Procedure: Right Arm ARTERIOVENOUS (AV) FISTULA CREATION;  Surgeon: Chuck Hint, MD;  Location: [facility] OR;  Service: Vascular;  Laterality: Right;  . CATARACT EXTRACTION W/ INTRAOCULAR LENS  IMPLANT, BILATERAL    . COLONOSCOPY    . ERCP W/ SPHICTEROTOMY    . FEMORAL-POPLITEAL BYPASS GRAFT Left 07/22/2018   Procedure: left FEMORAL TO BELOW KNEE POPLITEAL ARTERY BYPASS GREAFT using 41mm removable ring propaten gore graft;  Surgeon: Maeola Harman, MD;  Location:  [facility] OR;  Service: Vascular;  Laterality: Left;  . FEMORAL-POPLITEAL BYPASS GRAFT Right 04/10/2019   Procedure: BYPASS GRAFT RIGHT FEMORAL-POPLITEAL ARTERY;  Surgeon: Rosetta Posner, MD;  Location: [place];  Service: Vascular;  Laterality: Right;  . I & D EXTREMITY Right 05/05/2019   Procedure: Revision of  RIGHT  FOOT Amputation;  Surgeon: Waynetta Sandy, MD;  Location: [place];  Service: Vascular;  Laterality: Right;  . INTRAOPERATIVE TRANSTHORACIC ECHOCARDIOGRAM N/A 10/21/2018   Procedure: Intraoperative Transthoracic Echocardiogram;   Surgeon: Sherren Mocha, MD;  Location: [place];  Service: Open Heart Surgery;  Laterality: N/A;  . IR GENERIC HISTORICAL  01/27/2016   IR FLUORO GUIDE CV LINE RIGHT 01/27/2016 Arne Cleveland, MD [facility]-INTER[facility] RAD  . IR GENERIC HISTORICAL  01/27/2016   IR US GUIDE VASC ACCESS RIGHT 01/27/2016 Arne Cleveland, MD [facility]INTER[facility]  . LAPAROSCOPIC CHOLECYSTECTOMY  03/2011  . LEFT HEART CATH AND CORONARY ANGIOGRAPHY N/A 11/25/2017   Procedure: LEFT HEART CATH AND CORONARY ANGIOGRAPHY;  Surgeon: Martinique, Peter M, MD;  Location: [place] CV LAB;  Service: Cardiovascular;  Laterality: N/A;  . LEFT HEART CATH AND CORONARY ANGIOGRAPHY N/A 07/24/2018   Procedure: LEFT HEART CATH AND CORONARY ANGIOGRAPHY;  Surgeon: Sherren Mocha, MD;  Location: [place] CV LAB;  Service: Cardiovascular;  Laterality: N/A;  . RENAL BIOPSY, PERCUTANEOUS Right 09/08/2014  . RIGHT/LEFT HEART CATH AND CORONARY ANGIOGRAPHY N/A 03/25/2018   Procedure: RIGHT/LEFT HEART CATH AND CORONARY ANGIOGRAPHY;  Surgeon: Martinique, Peter M, MD;  Location: [place] CV LAB;  Service: Cardiovascular;  Laterality: N/A;  . TEE WITHOUT CARDIOVERSION N/A 05/14/2019   Procedure: TRANSESOPHAGEAL ECHOCARDIOGRAM (TEE);  Surgeon: Fay Records, MD;  Location: [facility] ENDOSCOPY;  Service: Cardiovascular;  Laterality: N/A;  . TOE AMPUTATION Left 08/19/2018   2nd & 3rd toes  . TONSILLECTOMY  ~ 1960  . TRANSCATHETER AORTIC VALVE REPLACEMENT, TRANSFEMORAL N/A 10/21/2018   Procedure: TRANSCATHETER AORTIC VALVE REPLACEMENT, TRANSFEMORAL;  Surgeon: Sherren Mocha, MD;  Location: [place];  Service: Open Heart Surgery;  Laterality: N/A;  . TRANSMETATARSAL AMPUTATION Right 04/29/2019   Procedure: RIGHT TRANSMETATARSAL AMPUTATION;  Surgeon: Waynetta Sandy, MD;  Location: [place];  Service: Vascular;  Laterality: Right;  . ULTRASOUND GUIDANCE FOR VASCULAR ACCESS  03/25/2018   Procedure: Ultrasound Guidance For Vascular Access;  Surgeon: Martinique, Peter M, MD;  Location: [place] CV LAB;  Service: Cardiovascular;;    There were no vitals filed for this visit.   Subjective Assessment - 10/15/19 1402    Subjective Patient reports that he is wearing prosthesis about 3 hrs/day. Denies any skin integrity issues. Patient is wearing a 5 ply sock today.    Patient is accompained by: Family member   dtr, Alysia Penna   Pertinent History Rt TTA, left 2nd & 3rd toe amp 08/19/2018, ESRD, HTN, CHF, aortic stenosis, left fem-pop bypass, peripheral neuropathy, back spasms,    Patient Stated Goals use prosthesis to be active in community, working on car, day travels, walks    Currently in Pain? Yes    Pain Score 4     Pain Location Back    Pain Orientation Mid;Lower    Pain Descriptors / Indicators Aching    Pain Type Chronic pain    Pain Onset More than a month ago    Pain Frequency Constant    Multiple Pain Sites No    Pain Onset More than a month ago  [place] Adult PT Treatment/Exercise - 10/15/19 0001      Transfers   Sit to Stand 5: Supervision;With upper extremity assist;With armrests;From chair/3-in-1;Other (comment)    Stand to Sit 5: Supervision;With upper extremity assist;With armrests;To chair/3-in-1;Other (comment)    Comments --      Ambulation/Gait   Ambulation/Gait Yes    Ambulation/Gait Assistance 5: Supervision    Ambulation/Gait Assistance Details PT raised RW by one notch with notable improvement in postural alignment and ability to follow step length and posture maintenance cueing     Ambulation Distance (Feet) 200 Feet   200 x 1, 150 x 1    Assistive device Rolling walker;Prosthesis    Gait Pattern Step-to pattern;Decreased step length - left;Decreased stance time - right;Decreased stride length;Decreased hip/knee flexion - right;Decreased weight shift to right;Right hip hike;Right flexed knee in stance;Left flexed knee in stance;Antalgic;Lateral hip instability;Trunk flexed;Abducted- right     Ambulation Surface Level;Indoor      Neuro Re-ed    Neuro Re-ed Details  Side stepping in // bars (x 6 laps) with B hand support. L fwd steps in // bars with R handed support only (CGA with PT behind patient) with cueing for control when returning to equal weight bearing between L steps x 12 (followed by seated rest break). Reaching in upper and lower right quadrant (reaching for PT's hand) in // bars for safety. Head turns/nods in // bars with CGA for safety x 20 sec each direction, 2 sets with intermittent B hand taps on // bars to prevent LOB especially when turning to R and looking up.       Prosthetics   Prosthetic Care Comments  has increased wear to 3hrs 1-2x/day; wearing 5 ply socks today     Current prosthetic wear tolerance (days/week)  --    Current prosthetic wear tolerance (#hours/day)  --    Residual limb condition  denies any skin integrity issues; states he is checking his skin consistently                    PT Short Term Goals - 10/08/19 1459      PT SHORT TERM GOAL #1   Title Patient demonstrates proper donning of prosthesis & verbalizes proper cleaning.  (All STGs Target Date: 10/23/2019)    Time 4    Period Weeks    Status On-going    Target Date 10/23/19      PT SHORT TERM GOAL #2   Title Patient tolerates prosthesis wear >8 hours total / day with no increased skin issues    Time 4    Period Weeks    Status On-going    Target Date 10/23/19      PT SHORT TERM GOAL #3   Title Standing balance without UE support reaching 5" anteriorly without UE support and picks up item from floor with RW support.    Baseline met 5 inch reach and picked up object on 7/29    Time 4    Period Weeks    Status Achieved    Target Date 10/23/19      PT SHORT TERM GOAL #4   Title Patient ambulates 150' with RW & prosthesis with supervision.    Baseline Met 7/29    Time 4    Period Weeks    Status Achieved    Target Date 10/23/19      PT SHORT TERM GOAL #5   Title  Patient negotiates ramps & curbs with rolling walker  and stairs with 2 rails with prosthesis with minA.    Time 4    Period Weeks    Status On-going    Target Date 10/23/19             PT Long Term Goals - 09/29/19 2214      PT LONG TERM GOAL #1   Title Patient verbalizes & demonstrates understanding of proper prosthetic care to enable safe utilization of prosthesis.  (All LTGs Target Date: 12/18/2019)    Time 12    Period Weeks    Status On-going    Target Date 12/18/19      PT LONG TERM GOAL #2   Title Patient tolerates prosthesis wear >90% of awake hours without skin issues & limb pain </= 2/10 to enable function throughout his day.    Time 12    Period Weeks    Status On-going    Target Date 12/18/19      PT LONG TERM GOAL #3   Title Berg Balance with prosthesis >/= 45/56 indicating lower fall risk.    Time 12    Period Weeks    Status On-going    Target Date 12/18/19      PT LONG TERM GOAL #4   Title Patient ambulates 500' with cane or less & prosthesis modified independent for community mobility.    Time 12    Period Weeks    Status On-going    Target Date 12/18/19      PT LONG TERM GOAL #5   Title Patient negotiates ramps, curbs & stairs with single rail with cane or less and prosthesis modified independent to enable community access.    Time 12    Period Weeks    Status On-going    Target Date 12/18/19      PT LONG TERM GOAL #6   Title Patient reports back & residual limb pain increases </= 2 increments on 0-10 scale with standing & gait activities.    Time 12    Period Weeks    Status On-going    Target Date 12/18/19                 Plan - 10/15/19 1632    Clinical Impression Statement Patient arrived late for today's appointment, and PT unable to accommodate due to schedule. Today's session focused on higher level balance challenges, but requires frequent "resets" to bring him back to a more neutral weight bearing position. He tolerated  today's session well denying any pain, but does require intermittent seated rest breaks due to [place]. He will benefit from continued skilled PT to maximize functional mobility.    Personal Factors and Comorbidities Comorbidity 3+;Fitness;Time since onset of injury/illness/exacerbation;Past/Current Experience    Comorbidities Rt TTA, left 2nd & 3rd toe amp 08/19/2018, ESRD, HTN, CHF, aortic stenosis, left fem-pop bypass, peripheral neuropathy, back spasms    Examination-Activity Limitations Lift;Locomotion Level;Stairs;Stand;Transfers    Examination-Participation Restrictions Community Activity;Shop    Stability/Clinical Decision Making Evolving/Moderate complexity    Rehab Potential Good    PT Frequency 2x / week    PT Duration 12 weeks    PT Treatment/Interventions ADLs/Self Care Home Management;Canalith Repostioning;DME Instruction;Gait training;Stair training;Functional mobility training;Therapeutic activities;Therapeutic exercise;Balance training;Neuromuscular re-education;Patient/family education;Prosthetic Training;Manual techniques;Scar mobilization;Passive range of motion;Dry needling;Joint Manipulations;Vestibular    PT Next Visit Plan review prosthetic care paticularly cleaining , review Prosthetic gait instructing in ramps, curbs & stairs with RW, obstacle negotiation, higher level balance challenges    PT Home Exercise Plan .  ampsink, see pt instructions section on 09/24/19 note    Consulted and Agree with Plan of Care Patient    Family Member Consulted --           Patient will benefit from skilled therapeutic intervention in order to improve the following deficits and impairments:  Abnormal gait, Decreased activity tolerance, Decreased balance, Decreased endurance, Decreased knowledge of use of DME, Decreased mobility, Decreased range of motion, Decreased skin integrity, Decreased scar mobility, Decreased strength, Dizziness, Increased edema, Postural dysfunction, Prosthetic  Dependency, Pain  Visit Diagnosis: Other abnormalities of gait and mobility  Muscle weakness (generalized)  Unsteadiness on feet  Difficulty in walking, not elsewhere classified  Right below-knee amputee [facility])     Problem List Patient Active Problem List   Diagnosis Date Noted  . Neuropathy 10/06/2019  . Gait abnormality 08/06/2019  . Back muscle spasm 07/09/2019  . Sleep disturbance   . Nausea without vomiting   . Orthostatic hypotension   . Acute on chronic anemia   . Chronic combined systolic and diastolic congestive heart failure ([place])   . ESRD on dialysis ([place])   . Right below-knee amputee ([place]) 05/18/2019  . Pressure injury of skin 05/14/2019  . Enterococcal bacteremia 05/13/2019  . Polymorphic ventricular tachycardia ([place]) 10/24/2018  . S/P TAVR (transcatheter aortic valve replacement) 10/21/2018  . Anxiety   . Coronary artery disease involving native coronary artery of native heart without angina pectoris   . Thrombocytopenia ([place])   . PAD (peripheral artery disease) ([place]) 07/22/2018  . Acute on chronic combined systolic and diastolic CHF (congestive heart failure) ([place]) 11/30/2017  . ESRD (end stage renal disease) on dialysis ([place])   . Iron deficiency anemia 11/22/2015  . AL amyloidosis ([place]) 09/25/2014  . Anemia of chronic disease 09/25/2014  . Essential hypertension, benign 11/06/2013    Juliann Pulse, PT, DPT  10/15/2019, 4:36 PM  [facility] Physical Therapy [address] [place], [place], [address] Phone: [phone]   Fax:  [phone]  Name: Jose Garrett MRN: [id number] Date of Birth: [birth year]

## 2019-10-20 ENCOUNTER — Ambulatory Visit (INDEPENDENT_AMBULATORY_CARE_PROVIDER_SITE_OTHER): Payer: Medicare Other | Admitting: Physical Therapy

## 2019-10-20 ENCOUNTER — Encounter: Payer: Self-pay | Admitting: Physical Therapy

## 2019-10-20 ENCOUNTER — Other Ambulatory Visit: Payer: Self-pay

## 2019-10-20 DIAGNOSIS — R2689 Other abnormalities of gait and mobility: Secondary | ICD-10-CM | POA: Diagnosis not present

## 2019-10-20 DIAGNOSIS — M6281 Muscle weakness (generalized): Secondary | ICD-10-CM | POA: Diagnosis not present

## 2019-10-20 DIAGNOSIS — R2681 Unsteadiness on feet: Secondary | ICD-10-CM

## 2019-10-20 DIAGNOSIS — M545 Low back pain: Secondary | ICD-10-CM

## 2019-10-20 DIAGNOSIS — R293 Abnormal posture: Secondary | ICD-10-CM

## 2019-10-20 DIAGNOSIS — M25661 Stiffness of right knee, not elsewhere classified: Secondary | ICD-10-CM

## 2019-10-20 DIAGNOSIS — G8929 Other chronic pain: Secondary | ICD-10-CM

## 2019-10-20 DIAGNOSIS — M25662 Stiffness of left knee, not elsewhere classified: Secondary | ICD-10-CM

## 2019-10-20 NOTE — Therapy (Signed)
[facility] Physical Therapy [address] [place], [place], [address] Phone: [phone]   Fax:  [phone]  Physical Therapy Treatment  Patient Details  Name: NATHIN SARAN MRN: [id number] Date of Birth: November 28, 1950 Referring Provider (PT): Curt Jews, MD   Encounter Date: 10/20/2019   PT End of Session - 10/20/19 1341    Visit Number 6    Number of Visits 25    Date for PT Re-Evaluation 12/21/19    Authorization Type [employer] Medicare    Authorization Time Period $30 co-pay    Authorization - Number of Visits 10    PT Start Time 1345    PT Stop Time 1433    PT Time Calculation (min) 48 min    Equipment Utilized During Treatment Gait belt    Activity Tolerance Patient tolerated treatment well    Behavior During Therapy [facility] for tasks assessed/performed           Past Medical History:  Diagnosis Date  . Amyloidosis ([place])   . Anemia of chronic disease   . Anxiety   . CAD (coronary artery disease)    a. cardiac cath on 11/25/17 with occlusion of the RCA which could have been his event but the RCA could not be engaged. The RCA filled distally from left to right collaterals. There were no severe stenoses in the left coronary system. Medical therapy recommended, started on plavix.  . Chronic combined systolic and diastolic CHF (congestive heart failure) ([place])    a. EF dropped to 35-40% with grade 2 DD by echo 11/2017.  [name] DJD (degenerative joint disease), lumbar   . ESRD (end stage renal disease) ([place])    ESRD- HEMO MWF- [employer]  . GERD (gastroesophageal reflux disease)   . Hyperlipidemia   . Hypertension   . Myocardial infarction ([place])   . Neuropathy   . Peripheral vascular disease ([place])   . Pneumonia   . Restless legs   . S/P TAVR (transcatheter aortic valve replacement) 10/21/2018   26 mm Edwards Sapien 3 Ultra transcatheter heart valve placed via percutaneous right transfemoral approach   . Severe aortic stenosis    severe    Past Surgical  History:  Procedure Laterality Date  . ABDOMINAL AORTOGRAM W/LOWER EXTREMITY Bilateral 07/16/2018   Procedure: ABDOMINAL AORTOGRAM W/LOWER EXTREMITY;  Surgeon: Wellington Hampshire, MD;  Location: [place] [facility];  Service: Cardiovascular;  Laterality: Bilateral;  . ABDOMINAL AORTOGRAM W/LOWER EXTREMITY N/A 04/09/2019   Procedure: ABDOMINAL AORTOGRAM W/LOWER EXTREMITY - Right Lower;  Surgeon: Waynetta Sandy, MD;  Location: [place] [facility];  Service: Cardiovascular;  Laterality: N/A;  . AMPUTATION Left 08/19/2018   Procedure: AMPUTATION LEFT TOES SECOND AND THIRD;  Surgeon: Waynetta Sandy, MD;  Location: [place];  Service: Vascular;  Laterality: Left;  . AMPUTATION Right 05/07/2019   Procedure: AMPUTATION BELOW KNEE;  Surgeon: Serafina Mitchell, MD;  Location: [facility] OR;  Service: Vascular;  Laterality: Right;  . AV FISTULA PLACEMENT Right 12/27/2015   Procedure: Right Arm ARTERIOVENOUS (AV) FISTULA CREATION;  Surgeon: Angelia Mould, MD;  Location: [place];  Service: Vascular;  Laterality: Right;  . CATARACT EXTRACTION W/ INTRAOCULAR LENS  IMPLANT, BILATERAL    . COLONOSCOPY    . ERCP W/ SPHICTEROTOMY    . FEMORAL-POPLITEAL BYPASS GRAFT Left 07/22/2018   Procedure: left FEMORAL TO BELOW KNEE POPLITEAL ARTERY BYPASS GREAFT using 9m removable ring propaten gore graft;  Surgeon: CWaynetta Sandy MD;  [place]  Service:  Vascular;  Laterality: Left;  . FEMORAL-POPLITEAL BYPASS GRAFT Right 04/10/2019   Procedure: BYPASS GRAFT RIGHT FEMORAL-POPLITEAL ARTERY;  Surgeon: Rosetta Posner, MD;  Location: [place];  Service: Vascular;  Laterality: Right;  . I & D EXTREMITY Right 05/05/2019   Procedure: Revision of  RIGHT  FOOT Amputation;  Surgeon: Waynetta Sandy, MD;  Location: [place];  Service: Vascular;  Laterality: Right;  . INTRAOPERATIVE TRANSTHORACIC ECHOCARDIOGRAM N/A 10/21/2018   Procedure: Intraoperative Transthoracic Echocardiogram;  Surgeon: Sherren Mocha,  MD;  Location: [place];  Service: Open Heart Surgery;  Laterality: N/A;  . IR GENERIC HISTORICAL  01/27/2016   IR FLUORO GUIDE CV LINE RIGHT 01/27/2016 Arne Cleveland, MD [facility]INTER[facility]  . IR GENERIC HISTORICAL  01/27/2016   IR US GUIDE VASC ACCESS RIGHT 01/27/2016 Arne Cleveland, MD [facility]INTER[facility]  . LAPAROSCOPIC CHOLECYSTECTOMY  03/2011  . LEFT HEART CATH AND CORONARY ANGIOGRAPHY N/A 11/25/2017   Procedure: LEFT HEART CATH AND CORONARY ANGIOGRAPHY;  Surgeon: Martinique, Peter M, MD;  Location: [place] [facility];  Service: Cardiovascular;  Laterality: N/A;  . LEFT HEART CATH AND CORONARY ANGIOGRAPHY N/A 07/24/2018   Procedure: LEFT HEART CATH AND CORONARY ANGIOGRAPHY;  Surgeon: Sherren Mocha, MD;  Location: [place] [facility];  Service: Cardiovascular;  Laterality: N/A;  . RENAL BIOPSY, PERCUTANEOUS Right 09/08/2014  . RIGHT/LEFT HEART CATH AND CORONARY ANGIOGRAPHY N/A 03/25/2018   Procedure: RIGHT/LEFT HEART CATH AND CORONARY ANGIOGRAPHY;  Surgeon: Martinique, Peter M, MD;  Location: [place] [facility];  Service: Cardiovascular;  Laterality: N/A;  . TEE WITHOUT CARDIOVERSION N/A 05/14/2019   Procedure: TRANSESOPHAGEAL ECHOCARDIOGRAM (TEE);  Surgeon: Fay Records, MD;  Location[facility] ENDOSCOPY;  Service: Cardiovascular;  Laterality: N/A;  . TOE AMPUTATION Left 08/19/2018   2nd & 3rd toes  . TONSILLECTOMY  ~ 1960  . TRANSCATHETER AORTIC VALVE REPLACEMENT, TRANSFEMORAL N/A 10/21/2018   Procedure: TRANSCATHETER AORTIC VALVE REPLACEMENT, TRANSFEMORAL;  Surgeon: Sherren Mocha, MD;  Location: [place];  Service: Open Heart Surgery;  Laterality: N/A;  . TRANSMETATARSAL AMPUTATION Right 04/29/2019   Procedure: RIGHT TRANSMETATARSAL AMPUTATION;  Surgeon: Waynetta Sandy, MD;  Location: [place];  Service: Vascular;  Laterality: Right;  . ULTRASOUND GUIDANCE FOR VASCULAR ACCESS  03/25/2018   Procedure: Ultrasound Guidance For Vascular Access;  Surgeon: Martinique, Peter M, MD;  Location: [place] [facility];  Service:  Cardiovascular;;    There were no vitals filed for this visit.   Subjective Assessment - 10/20/19 1342    Subjective He is wearing prosthesis 2 hours 1x/day. He thinks skin looks okay. His knee has been hurting but not today.    Patient is accompained by: Family member   dtr, Alysia Penna   Pertinent History Rt TTA, left 2nd & 3rd toe amp 08/19/2018, ESRD, HTN, CHF, aortic stenosis, left fem-pop bypass, peripheral neuropathy, back spasms,    Patient Stated Goals use prosthesis to be active in community, working on car, day travels, walks    Currently in Pain? Yes    Pain Score 5    in last week, highest 8-9/10,  lowest 2-3/10   Pain Location Back    Pain Orientation Lower;Right    Pain Descriptors / Indicators Shooting;Grimacing;Spasm    Pain Type Chronic pain    Pain Radiating Towards to right knee    Pain Onset More than a month ago    Pain Frequency Constant    Aggravating Factors  unknown    Pain Relieving Factors tylenol & oxy  and  sits down to rest.    Pain Onset More than a month ago                             [facility] Adult PT Treatment/Exercise - 10/20/19 1345      Transfers   Sit to Stand 5: Supervision;With upper extremity assist;With armrests;From chair/3-in-1;Other (comment)    Stand to Sit 5: Supervision;With upper extremity assist;With armrests;To chair/3-in-1;Other (comment)      Ambulation/Gait   Ambulation/Gait Yes    Ambulation/Gait Assistance 5: Supervision;4: Min assist   supervision RW & minA cane   Ambulation/Gait Assistance Details PT adjusted pt RW to facilitate upright posture.  Verbal cues on step length.   Cane prosthetic gait with verbal & tactile cues on sequence and weight shift / balance reaction.      Ambulation Distance (Feet) 200 Feet   200' RW & 60' X 2 cane   Assistive device Rolling walker;Prosthesis;Straight cane    Gait Pattern Step-to pattern;Decreased step length - left;Decreased stance time - right;Decreased stride  length;Decreased hip/knee flexion - right;Decreased weight shift to right;Right hip hike;Right flexed knee in stance;Left flexed knee in stance;Antalgic;Lateral hip instability;Trunk flexed;Abducted- right    Ambulation Surface Level;Indoor    Ramp 4: Min assist   min guard with RW & prosthesis   Ramp Details (indicate cue type and reason) verbal cues on technique with TTA prosthesis    Curb 4: Min assist   min guard with RW & prosthesis   Curb Details (indicate cue type and reason) verbal cues on sequence      Neuro Re-ed    Neuro Re-ed Details  --      Prosthetics   Prosthetic Care Comments  Increase wear 3hrs on 2 hours off rotating awake hours.  Subtracting prosthesis weight 2.4kg from weigh-in & weigh-out. Do not adjust dry weight to include prosthesis.  PT instructed in donning with long pants.     Current prosthetic wear tolerance (days/week)  daily    Current prosthetic wear tolerance (#hours/day)  2 hours    Residual limb condition  no wounds or skin issues. normal color, dry distally,     Education Provided Skin check;Residual limb care;Correct ply sock adjustment;Proper Donning;Proper wear schedule/adjustment;Other (comment)   see prosthetic care comments   Person(s) Educated Patient    Education Method Explanation;Demonstration;Tactile cues;Verbal cues    Education Method Verbalized understanding;Returned demonstration;Tactile cues required;Verbal cues required    Donning Prosthesis Supervision                    PT Short Term Goals - 10/20/19 1615      PT SHORT TERM GOAL #1   Title Patient demonstrates proper donning of prosthesis & verbalizes proper cleaning.  (All STGs Target Date: 10/23/2019)    Baseline MET 10/20/2019    Time 4    Period Weeks    Status Achieved    Target Date 10/23/19      PT SHORT TERM GOAL #2   Title Patient tolerates prosthesis wear >8 hours total / day with no increased skin issues    Time 4    Period Weeks    Status On-going     Target Date 10/23/19      PT SHORT TERM GOAL #3   Title Standing balance without UE support reaching 5" anteriorly without UE support and picks up item from floor with RW support.    Baseline MET 10/20/2019  Time 4    Period Weeks    Status Achieved    Target Date 10/23/19      PT SHORT TERM GOAL #4   Title Patient ambulates 150' with RW & prosthesis with supervision.    Baseline Met 7/29    Time 4    Period Weeks    Status Achieved    Target Date 10/23/19      PT SHORT TERM GOAL #5   Title Patient negotiates ramps & curbs with rolling walker and stairs with 2 rails with prosthesis with minA.    Time 4    Period Weeks    Status On-going    Target Date 10/23/19             PT Long Term Goals - 09/29/19 2214      PT LONG TERM GOAL #1   Title Patient verbalizes & demonstrates understanding of proper prosthetic care to enable safe utilization of prosthesis.  (All LTGs Target Date: 12/18/2019)    Time 12    Period Weeks    Status On-going    Target Date 12/18/19      PT LONG TERM GOAL #2   Title Patient tolerates prosthesis wear >90% of awake hours without skin issues & limb pain </= 2/10 to enable function throughout his day.    Time 12    Period Weeks    Status On-going    Target Date 12/18/19      PT LONG TERM GOAL #3   Title Berg Balance with prosthesis >/= 45/56 indicating lower fall risk.    Time 12    Period Weeks    Status On-going    Target Date 12/18/19      PT LONG TERM GOAL #4   Title Patient ambulates 500' with cane or less & prosthesis modified independent for community mobility.    Time 12    Period Weeks    Status On-going    Target Date 12/18/19      PT LONG TERM GOAL #5   Title Patient negotiates ramps, curbs & stairs with single rail with cane or less and prosthesis modified independent to enable community access.    Time 12    Period Weeks    Status On-going    Target Date 12/18/19      PT LONG TERM GOAL #6   Title Patient reports  back & residual limb pain increases </= 2 increments on 0-10 scale with standing & gait activities.    Time 12    Period Weeks    Status On-going    Target Date 12/18/19                 Plan - 10/20/19 1342    Clinical Impression Statement Patient improved prosthetic gait with RW and PT added cane but requires assistance for safety.  PT instructed in donning prosthesis with long pants and he appears to understand.    Personal Factors and Comorbidities Comorbidity 3+;Fitness;Time since onset of injury/illness/exacerbation;Past/Current Experience    Comorbidities Rt TTA, left 2nd & 3rd toe amp 08/19/2018, ESRD, HTN, CHF, aortic stenosis, left fem-pop bypass, peripheral neuropathy, back spasms    Examination-Activity Limitations Lift;Locomotion Level;Stairs;Stand;Transfers    Examination-Participation Restrictions Community Activity;Shop    Stability/Clinical Decision Making Evolving/Moderate complexity    Rehab Potential Good    PT Frequency 2x / week    PT Duration 12 weeks    PT Treatment/Interventions ADLs/Self Care Home Management;Canalith Repostioning;DME Instruction;Gait training;Stair training;Functional mobility  training;Therapeutic activities;Therapeutic exercise;Balance training;Neuromuscular re-education;Patient/family education;Prosthetic Training;Manual techniques;Scar mobilization;Passive range of motion;Dry needling;Joint Manipulations;Vestibular    PT Next Visit Plan check remaining STGs, review prosthetic care paticularly cleaining , review Prosthetic gait instructing in ramps, curbs & stairs with RW, obstacle negotiation, higher level balance challenges    PT Home Exercise Plan .ampsink, see pt instructions section on 09/24/19 note    Consulted and Agree with Plan of Care Patient           Patient will benefit from skilled therapeutic intervention in order to improve the following deficits and impairments:  Abnormal gait, Decreased activity tolerance, Decreased balance,  Decreased endurance, Decreased knowledge of use of DME, Decreased mobility, Decreased range of motion, Decreased skin integrity, Decreased scar mobility, Decreased strength, Dizziness, Increased edema, Postural dysfunction, Prosthetic Dependency, Pain  Visit Diagnosis: Other abnormalities of gait and mobility  Muscle weakness (generalized)  Unsteadiness on feet  Abnormal posture  Stiffness of right knee, not elsewhere classified  Stiffness of left knee, not elsewhere classified  Chronic bilateral low back pain without sciatica     Problem List Patient Active Problem List   Diagnosis Date Noted  . Neuropathy 10/06/2019  . Gait abnormality 08/06/2019  . Back muscle spasm 07/09/2019  . Sleep disturbance   . Nausea without vomiting   . Orthostatic hypotension   . Acute on chronic anemia   . Chronic combined systolic and diastolic congestive heart failure ([place])   . ESRD on dialysis ([place])   . Right below-knee amputee ([place]) 05/18/2019  . Pressure injury of skin 05/14/2019  . Enterococcal bacteremia 05/13/2019  . Polymorphic ventricular tachycardia ([place]) 10/24/2018  . S/P TAVR (transcatheter aortic valve replacement) 10/21/2018  . Anxiety   . Coronary artery disease involving native coronary artery of native heart without angina pectoris   . Thrombocytopenia ([place])   . PAD (peripheral artery disease) ([place]) 07/22/2018  . Acute on chronic combined systolic and diastolic CHF (congestive heart failure) ([place]) 11/30/2017  . ESRD (end stage renal disease) on dialysis ([place])   . Iron deficiency anemia 11/22/2015  . AL amyloidosis ([place]) 09/25/2014  . Anemia of chronic disease 09/25/2014  . Essential hypertension, benign 11/06/2013    Jamey Reas PT, DPT 10/20/2019, 4:18 PM  [facility] [address] [place], [place], [address] Phone: [phone]   Fax:  [phone]  Name: SHAMEL GERMOND MRN: [id number] Date of Birth: 09/23/1950

## 2019-10-22 ENCOUNTER — Encounter: Payer: Self-pay | Admitting: Physician Assistant

## 2019-10-22 ENCOUNTER — Ambulatory Visit: Payer: Medicare Other | Admitting: Physician Assistant

## 2019-10-22 ENCOUNTER — Ambulatory Visit (HOSPITAL_COMMUNITY): Payer: Medicare Other | Attending: Cardiology

## 2019-10-22 ENCOUNTER — Other Ambulatory Visit: Payer: Self-pay

## 2019-10-22 VITALS — BP 90/62 | HR 62 | Ht 70.0 in | Wt 157.0 lb

## 2019-10-22 DIAGNOSIS — Z952 Presence of prosthetic heart valve: Secondary | ICD-10-CM

## 2019-10-22 LAB — ECHOCARDIOGRAM COMPLETE
AR max vel: 1.93 cm2
AV Area VTI: 2.08 cm2
AV Area mean vel: 2.03 cm2
AV Mean grad: 9 mmHg
AV Peak grad: 15.2 mmHg
Ao pk vel: 1.95 m/s
Area-P 1/2: 2.39 cm2
S' Lateral: 3.42 cm

## 2019-10-22 NOTE — Progress Notes (Signed)
[facility]                                       Cardiology Office Note    Date:  10/22/2019   ID:  Jose Garrett, DOB 03-15-1950, MRN [id number]  PCP:  [name], MD  Cardiologist:  Dr. Angelena Form Dr. Burt Knack &Dr. Roxy Manns (TAVR)  CC: 1 year s/p TAVR  History of Present Illness:  Jose Garrett is a 69 y.o. male with a history of ESRD on HD, AL amyloid (kidney involvement), HTN, HLD, GERD, anemia, thrombocytopenia, ischemic cardiomyopathy, chronic combined CHF, CAD,RBBB,carotid artery disease, PAD w/ critical limb ischemia bilaterally s/p revascularization and toe amputations, s/p R BKA, left groin seroma, and severe AS s/p TAVR (10/21/18) who presents to clinic for follow up.   Patient's cardiac history dates back to9/2019when hewas hospitalized with acute respiratory failure in the setting of hypertensive urgency. Troponin levels were elevated and EKG revealed diffuse ST segment depression. Cardiac catheterization revealed occlusion of the right coronary artery with nonobstructive disease in the left coronary territory. The right coronary artery could not be engaged. He was treated medically. Follow-up echocardiogram performed10/10/19revealed normal left ventricular systolic function with moderate concentric hypertrophy and findings worrisome for possible amyloid infiltration. The aoritcvalve was felt to be functionally bicuspid with moderate aortic stenosis.Patient remained stable 03/2018 when he was hospitalized again with an exacerbation of congestive heart failure. Echocardiogram performed at that time revealed what was felt to be significant drop in left ventricular ejection fraction to 45%. Transvalvular gradient across the aortic valve was unchanged. The patient was seen in follow-up by Dr. Angelena Form in 04/2018 but plans for further evaluation to considerTAVRwere delayed because of COVID-19.In 07/2018 the patient was  hospitalized with critical limb ischemia involving his left lower extremity with 2 gangrenous toes. He was found to have bilateral chronic occlusion of the superficial femoral arteries as well as the left popliteal artery. He underwent left common femoral to below-knee popliteal artery bypass graft using synthetic Gore-Tex graft. Postoperatively,he suffered an acute exacerbation of chronic combined systolic and diastolic congestive heart failure with mildly positive troponins consistent with NSTEMI. Catheterization revealed no significant change in his coronary artery disease with chronic occlusion of the right coronary artery and no significant disease in the left coronary circulation. Echocardiogram revealed further drop in left ventricular systolic function with ejection fraction estimated 35 to 40%. There was at least moderate and probably severe aortic stenosis. Peak velocity across aortic valve measured as high as 3.7 m/s corresponding to mean transvalvular gradient estimated 30 mmHg and aortic valve area calculated 0.87 cm. The DVI was notably only 0.20. His subsequent recovery was slow and he later underwent amputation of his gangrenous toes. He had a prolonged recovery requiring inpatient rehab. He developed a large seroma in the left groin associated with his surgical incision which has been treated conservatively. He then began having problems tolerating dialysis with tendency to get hypotension towards the completion of dialysis treatments.  The patient was evaluated by the multidisciplinary valve team and felt to have severe, symptomatic aortic stenosis and to be a suitable candidate for TAVR, which was set up for8/11/20.However, the patient developed worsening dyspnea and failure to thrive and presented to the hospital on 10/17/18 for evaluation and was admitted until TAVR. He underwent successful TAVR with a 26 mm Edwards Sapien Ultra THV via  the R TF approach on 10/21/18. Post operative  echo showed persistent moderate to severe LV systolic dysfunction (EF 44-03%) with normally functioning TAVR with no AI and mean gradient 16 mmHg. There was also mild RVE with mildly reduced function and a small pericardial effusion. He was started on aspirin and plavix, but aspirin later discontinued given worsening thrombocytopenia. Frequent runs of polymorphic VT was noted on tele. He was started on amiodarone and LifeVest was placed. 1 month echo showed EF 35-40%, normally functioning TAVR with mean gradient of 13.8 mm Hg and no PVL.   Readmitted in 04/2019 with critical limb ischemia of right lower extremity s/p femoral to below the knee popliteal bypass with vein by Dr. Donnetta Hutching and later right transmetarsal amputation. He had bacteremia in 05/2019 and TEE showed improvement in EF to normal and normally functioning valve and no evidence of endocarditis. Later had right BKA.  Today he presents to clinic for follow up. Doing well. He has had a big improvement since TAVR. Having significant back spasms. No CP. Has SOBw with moderate activities. No LE edema, orthopnea or PND. No dizziness or syncope. No blood in stool or urine. No palpitations. Moved to [place] now. Has since gotten divorced.    Past Medical History:  Diagnosis Date   Amyloidosis ([place])    Anemia of chronic disease    Anxiety    CAD (coronary artery disease)    a. cardiac cath on 11/25/17 with occlusion of the RCA which could have been his event but the RCA could not be engaged. The RCA filled distally from left to right collaterals. There were no severe stenoses in the left coronary system. Medical therapy recommended, started on plavix.   Chronic combined systolic and diastolic CHF (congestive heart failure) ([place])    a. EF dropped to 35-40% with grade 2 DD by echo 11/2017.   DJD (degenerative joint disease), lumbar    ESRD (end stage renal disease) ([place])    ESRD- HEMO MWF- [facility]   GERD (gastroesophageal reflux  disease)    Hyperlipidemia    Hypertension    Myocardial infarction [facility])    Neuropathy    Peripheral vascular disease ([facility])    Pneumonia    Restless legs    S/P TAVR (transcatheter aortic valve replacement) 10/21/2018   26 mm Edwards Sapien 3 Ultra transcatheter heart valve placed via percutaneous right transfemoral approach    Severe aortic stenosis    severe    Past Surgical History:  Procedure Laterality Date   ABDOMINAL AORTOGRAM W/LOWER EXTREMITY Bilateral 07/16/2018   Procedure: ABDOMINAL AORTOGRAM W/LOWER EXTREMITY;  Surgeon: Wellington Hampshire, MD;  Location: [facility];  Service: Cardiovascular;  Laterality: Bilateral;   ABDOMINAL AORTOGRAM W/LOWER EXTREMITY N/A 04/09/2019   Procedure: ABDOMINAL AORTOGRAM W/LOWER EXTREMITY - Right Lower;  Surgeon: Waynetta Sandy, MD;  Location: [place] [facility];  Service: Cardiovascular;  Laterality: N/A;   AMPUTATION Left 08/19/2018   Procedure: AMPUTATION LEFT TOES SECOND AND THIRD;  Surgeon: Waynetta Sandy, MD;  Location: [facility];  Service: Vascular;  Laterality: Left;   AMPUTATION Right 05/07/2019   Procedure: AMPUTATION BELOW KNEE;  Surgeon: Serafina Mitchell, MD;  Location: [facility];  Service: Vascular;  Laterality: Right;   AV FISTULA PLACEMENT Right 12/27/2015   Procedure: Right Arm ARTERIOVENOUS (AV) FISTULA CREATION;  Surgeon: Angelia Mould, MD;  Location: [facility];  Service: Vascular;  Laterality: Right;   CATARACT EXTRACTION W/ INTRAOCULAR LENS  IMPLANT, BILATERAL  COLONOSCOPY     ERCP W/ SPHICTEROTOMY     FEMORAL-POPLITEAL BYPASS GRAFT Left 07/22/2018   Procedure: left FEMORAL TO BELOW KNEE POPLITEAL ARTERY BYPASS GREAFT using 9mm removable ring propaten gore graft;  Surgeon: Waynetta Sandy, MD;  Location: [facility];  Service: Vascular;  Laterality: Left;   FEMORAL-POPLITEAL BYPASS GRAFT Right 04/10/2019   Procedure: BYPASS GRAFT RIGHT FEMORAL-POPLITEAL ARTERY;  Surgeon: Rosetta Posner, MD;  Location: [facility];  Service: Vascular;  Laterality: Right;   I & D EXTREMITY Right 05/05/2019   Procedure: Revision of  RIGHT  FOOT Amputation;  Surgeon: Waynetta Sandy, MD;  Location: [facility];  Service: Vascular;  Laterality: Right;   INTRAOPERATIVE TRANSTHORACIC ECHOCARDIOGRAM N/A 10/21/2018   Procedure: Intraoperative Transthoracic Echocardiogram;  Surgeon: Sherren Mocha, MD;  Location: [facility];  Service: Open Heart Surgery;  Laterality: N/A;   IR GENERIC HISTORICAL  01/27/2016   IR FLUORO GUIDE CV LINE RIGHT 01/27/2016 Arne Cleveland, MD [facility]   IR GENERIC HISTORICAL  01/27/2016   IR US GUIDE VASC ACCESS RIGHT 01/27/2016 Arne Cleveland, MD [facility]   LAPAROSCOPIC CHOLECYSTECTOMY  03/2011   LEFT HEART CATH AND CORONARY ANGIOGRAPHY N/A 11/25/2017   Procedure: LEFT HEART CATH AND CORONARY ANGIOGRAPHY;  Surgeon: Martinique, Peter M, MD;  Location: [facility];  Service: Cardiovascular;  Laterality: N/A;   LEFT HEART CATH AND CORONARY ANGIOGRAPHY N/A 07/24/2018   Procedure: LEFT HEART CATH AND CORONARY ANGIOGRAPHY;  Surgeon: Sherren Mocha, MD;  Location: [facility];  Service: Cardiovascular;  Laterality: N/A;   RENAL BIOPSY, PERCUTANEOUS Right 09/08/2014   RIGHT/LEFT HEART CATH AND CORONARY ANGIOGRAPHY N/A 03/25/2018   Procedure: RIGHT/LEFT HEART CATH AND CORONARY ANGIOGRAPHY;  Surgeon: Martinique, Peter M, MD;  Location: [place] [facility];  Service: Cardiovascular;  Laterality: N/A;   TEE WITHOUT CARDIOVERSION N/A 05/14/2019   Procedure: TRANSESOPHAGEAL ECHOCARDIOGRAM (TEE);  Surgeon: Fay Records, MD;  [facility];  Service: Cardiovascular;  Laterality: N/A;   TOE AMPUTATION Left 08/19/2018   2nd & 3rd toes   TONSILLECTOMY  ~ 1960   TRANSCATHETER AORTIC VALVE REPLACEMENT, TRANSFEMORAL N/A 10/21/2018   Procedure: TRANSCATHETER AORTIC VALVE REPLACEMENT, TRANSFEMORAL;  Surgeon: Sherren Mocha, MD;  Location: [place];  Service: Open Heart  Surgery;  Laterality: N/A;   TRANSMETATARSAL AMPUTATION Right 04/29/2019   Procedure: RIGHT TRANSMETATARSAL AMPUTATION;  Surgeon: Waynetta Sandy, MD;  Location: [facility];  Service: Vascular;  Laterality: Right;   ULTRASOUND GUIDANCE FOR VASCULAR ACCESS  03/25/2018   Procedure: Ultrasound Guidance For Vascular Access;  Surgeon: Martinique, Peter M, MD;  Location: [facility];  Service: Cardiovascular;;    Current Medications: Outpatient Medications Prior to Visit  Medication Sig Dispense Refill   acetaminophen (TYLENOL) 325 MG tablet Take 1-2 tablets (325-650 mg total) by mouth every 4 (four) hours as needed for mild pain (or temp >/= 101 F).     amiodarone (PACERONE) 200 MG tablet Take 1 tablet (200 mg total) by mouth daily. 90 tablet 2   atorvastatin (LIPITOR) 80 MG tablet Take 1 tablet (80 mg total) by mouth daily with supper. 90 tablet 0   clopidogrel (PLAVIX) 75 MG tablet Take 1 tablet (75 mg total) by mouth daily with supper. 90 tablet 2   Darbepoetin Alfa (ARANESP) 100 MCG/0.5ML SOSY injection Inject 0.5 mLs (100 mcg total) into the vein every Friday with hemodialysis. 4.2 mL    docusate sodium (COLACE) 100 MG capsule Take 1 capsule (100 mg  total) by mouth 2 (two) times daily. 10 capsule 0   LORazepam (ATIVAN) 1 MG tablet Take 1 tablet (1 mg total) by mouth 2 (two) times daily as needed for anxiety. 20 tablet 0   Melatonin 3 MG TABS Take 0.5 tablets (1.5 mg total) by mouth at bedtime. 30 tablet 0   multivitamin (RENA-VIT) TABS tablet Take 1 tablet by mouth at bedtime. 30 tablet 0   oxyCODONE (OXY IR/ROXICODONE) 5 MG immediate release tablet Take 1-2 tablets (5-10 mg total) by mouth every 4 (four) hours as needed for moderate pain. 30 tablet 0   pantoprazole (PROTONIX) 40 MG tablet Take 1 tablet (40 mg total) by mouth daily. 30 tablet 0   Vancomycin (VANCOCIN) 750-5 MG/150ML-% SOLN Inject 150 mLs (750 mg total) into the vein every Monday, Wednesday, and Friday with  hemodialysis. 4000 mL 0   vitamin B-12 (CYANOCOBALAMIN) 1000 MCG tablet Take 1 tablet (1,000 mcg total) by mouth every evening. 30 tablet 0   calcitRIOL (ROCALTROL) 0.5 MCG capsule Take 3 capsules (1.5 mcg total) by mouth every Monday, Wednesday, and Friday with hemodialysis. (Patient not taking: Reported on 10/06/2019) 30 capsule 0   midodrine (PROAMATINE) 10 MG tablet Take 1 tablet (10 mg total) by mouth every Monday, Wednesday, and Friday with hemodialysis. (Patient not taking: Reported on 10/06/2019) 30 tablet 0   sucroferric oxyhydroxide (VELPHORO) 500 MG chewable tablet Chew 3 tablets (1,500 mg total) by mouth 3 (three) times daily with meals. 120 tablet 0   No facility-administered medications prior to visit.     Allergies:   Patient has no known allergies.   Social History   Socioeconomic History   Marital status: Divorced    Spouse name: Not on file   Number of children: Not on file   Years of education: Not on file   Highest education level: Not on file  Occupational History   Not on file  Tobacco Use   Smoking status: Former Smoker    Packs/day: 2.00    Years: 40.00    Pack years: 80.00    Types: Cigarettes    Quit date: 03/19/2011    Years since quitting: 8.6   Smokeless tobacco: Never Used  Vaping Use   Vaping Use: Never used  Substance and Sexual Activity   Alcohol use: No    Alcohol/week: 0.0 standard drinks   Drug use: No    Comment:  "I have used marijuana till the 1990's". Notes that he quit 15 yrs ago   Sexual activity: Not Currently  Other Topics Concern   Not on file  Social History Narrative   Pt lives alone in a 1 story home   Has 3 adult daughters   Highest level of education: associates degree   Worked in Runner, broadcasting/film/video.    Social Determinants of Health   Financial Resource Strain:    Difficulty of Paying Living Expenses:   Food Insecurity:    Worried About Charity fundraiser in the Last Year:    Arboriculturist in  the Last Year:   Transportation Needs:    Film/video editor (Medical):    Lack of Transportation (Non-Medical):   Physical Activity:    Days of Exercise per Week:    Minutes of Exercise per Session:   Stress:    Feeling of Stress :   Social Connections:    Frequency of Communication with Friends and Family:    Frequency of Social Gatherings with Friends and Family:  Attends Religious Services:    Active Member of Clubs or Organizations:    Attends Archivist Meetings:    Marital Status:      Family History:  The patient's family history includes Diabetes in his father, mother, and sister; Heart disease in his mother; Hypertension in his father and mother; Skin cancer in his mother.     ROS:   Please see the history of present illness.    ROS All other systems reviewed and are negative.   PHYSICAL EXAM:   VS:  BP 90/62    Pulse 62    Ht 5\' 10"  (1.778 m)    Wt 157 lb (71.2 kg)    SpO2 90%    BMI 22.53 kg/m    GEN: Well nourished, well developed, in no acute distress HEENT: normal Neck: no JVD or masses Cardiac: RRR; soft flow murmur @ RUSB. No rubs, or gallops,no edema . S/p R BKA with prosthesis.  Respiratory:  clear to auscultation bilaterally, normal work of breathing GI: soft, nontender, nondistended, + BS MS: no deformity or atrophy Skin: warm and dry, no rash. Left seroma that is stable.  Neuro:  Alert and Oriented x 3, Strength and sensation are intact Psych: euthymic mood, full affect   Wt Readings from Last 3 Encounters:  10/22/19 157 lb (71.2 kg)  10/06/19 165 lb (74.8 kg)  10/06/19 160 lb (72.6 kg)      Studies/Labs Reviewed:   EKG:  EKG is not ordered today.   Recent Labs: 10/23/2018: Magnesium 2.2 04/29/2019: ALT 7 05/27/2019: BUN 27; Creatinine, Ser 7.39; Hemoglobin 8.2; Platelets 97; Potassium 4.1; Sodium 135   Lipid Panel    Component Value Date/Time   CHOL 117 02/12/2018 0823   TRIG 174 (H) 02/12/2018 0823   HDL 32  (L) 02/12/2018 0823   CHOLHDL 3.7 02/12/2018 0823   CHOLHDL 7.4 02/25/2014 0959   VLDL 40 02/25/2014 0959   LDLCALC 50 02/12/2018 0823    Additional studies/ records that were reviewed today include:  TAVR OPERATIVE NOTE   Date of Procedure:10/21/2018  Preoperative Diagnosis:Severe Aortic Stenosis   Postoperative Diagnosis:Same   Procedure:   Transcatheter Aortic Valve Replacement - PercutaneousRightTransfemoral Approach Edwards Sapien 3 UltraTHV (size14mm, model # [id number], serial [id number])  Co-Surgeons:Clarence H. Roxy Manns, MD and Sherren Mocha, MD  Anesthesiologist:John Lissa Hoard, MD  Echocardiographer:Peter Johnsie Cancel, MD  Pre-operative Echo Findings: ? Severe aortic stenosis ? Moderate-severeleft ventricular systolic dysfunction  Post-operative Echo Findings: ? Noparavalvular leak ? Unchangedleft ventricular systolic function  __________________   Echo 10/22/18: IMPRESSIONS 1. The left ventricle has moderate-severely reduced systolic function, with an ejection fraction of 30-35%. The cavity size was normal. There is mildly increased left ventricular wall thickness. Left ventricular diastolic Doppler parameters are indeterminate. Left ventricular diffuse hypokinesis. 2. The right ventricle has mildly reduced systolic function. The cavity was mildly enlarged. 3. Large pleural effusion in the left lateral region. 4. Small pericardial effusion. 5. The mitral valve is abnormal. Mild thickening of the mitral valve leaflet. There is mild mitral annular calcification present. 6. The tricuspid valve is grossly normal. 7. The aorta is normal in size and structure. 8. The inferior vena cava was dilated in size with >50% respiratory variability. 9. Moderate to severe global reduction in LV systolic function; mild LVH; s/p TAVR with no AI and  mean gradient 16 mmHg; mild RVE with mildly reduced function; small pericardial effusion.  ____________  Echo 11/11/18 IMPRESSIONS  1. The left ventricle has moderately reduced systolic function,  with an ejection fraction of 35-40%. The cavity size was normal. There is mildly increased left ventricular wall thickness. Left ventricular diastolic Doppler parameters are consistent with  pseudonormalization.  2. The right ventricle has normal systolic function. The cavity was normal. There is no increase in right ventricular wall thickness.  3. Left atrial size was mildly dilated.  4. Mitral valve regurgitation is mild to moderate by color flow Doppler.  5. A Edwards Sapien bioprosthetic aortic valve (TAVR) valve is present in the aortic position.  6. No stenosis of the aortic valve.  7. The aorta is normal unless otherwise noted.  8. The aortic root and ascending aorta are normal in size and structure.  9. The atrial septum is grossly normal. 10. The average left ventricular global longitudinal strain is -7.5 %.  Mean gradient 13.8 mm Hg and no PVL  _________________  TEE 05/14/2019 IMPRESSIONS  1. No obvious vegetations.  2. Left ventricular ejection fraction, by estimation, is 55 to 60%. The  left ventricle has normal function.  3. Right ventricular systolic function is normal.  4. No left atrial/left atrial appendage thrombus was detected.  5. The aortic valve is grossly normal. Aortic valve regurgitation is not  visualized. There is a 26 mm Edwards Edwards Sapien prosthetic (TAVR)  valve present in the aortic position. Procedure Date: 10/21/2018. Echo  findings are consistent with normal  structure and function of the aortic valve prosthesis.   ___________________  Echo 10/22/19 IMPRESSIONS  1. Left ventricular ejection fraction, by estimation, is 45 to 50%. The  left ventricle has mildly decreased function. The left ventricle  demonstrates global hypokinesis. There is  moderate concentric left  ventricular hypertrophy. Left ventricular  diastolic parameters are consistent with Grade I diastolic dysfunction  (impaired relaxation). There is hypokinesis of the left ventricular, basal  inferior wall and inferolateral wall.  2. Right ventricular systolic function is mildly reduced. The right  ventricular size is normal. Tricuspid regurgitation signal is inadequate  for assessing PA pressure.  3. Left atrial size was mild to moderately dilated.  4. The mitral valve is abnormal. Trivial mitral valve regurgitation. No  evidence of mitral stenosis.  5. The aortic valve has been repaired/replaced. Aortic valve  regurgitation is not visualized. There is a 26 mm Edwards Sapien  prosthetic (TAVR) valve present in the aortic position. Procedure Date:  10/21/2018. Echo findings are consistent with normal  structure and function of the aortic valve prosthesis. Aortic valve mean  gradient measures 9.0 mmHg. Aortic valve Vmax measures 1.95 m/s.   Comparison(s): Prior images reviewed side by side.   Conclusion(s)/Recommendation(s): Images reviewed side by side with prior echoes. EF unchanged visually, but based on available images/measurements, appears that EF low normal to mildly reduced. Stable post TAVR.   ASSESSMENT & PLAN:   Severe AS s/p TAVR:  Echo today shows EF 45-50%, normally functioning TAVR with a mean gradient of 9  mmHg and no PVL. He has NYHA class II symptoms, but continues to have a big imrpvement in dysnea since TAVR. He is mostly limtied by fatigue 2/2 dialysis. SBE prophylaxis discussed; he is edentulous and does not go to dentist.. He has been continued on monotherapy with plavix given significant PAD. Follow up with Dr. Angelena Form in 6 months   Medication Adjustments/Labs and Tests Ordered: Current medicines are reviewed at length with the patient today.  Concerns regarding medicines are outlined above.  Medication changes, Labs and Tests ordered  today are listed in the Patient Instructions  below. Patient Instructions  Medication Instructions:  Your provider recommends that you continue on your current medications as directed. Please refer to the Current Medication list given to you today.   *If you need a refill on your cardiac medications before your next appointment, please call your pharmacy*   Follow-Up: Your provider recommends that you schedule a follow-up appointment in 6 MONTHS with Dr. Angelena Form.    Signed, Angelena Form, PA-C  10/22/2019 3:55 PM    [facility] [place], [place], [place]  [address] Phone: [phone]; Fax: [phone]

## 2019-10-22 NOTE — Patient Instructions (Signed)
Medication Instructions:  Your provider recommends that you continue on your current medications as directed. Please refer to the Current Medication list given to you today.   *If you need a refill on your cardiac medications before your next appointment, please call your pharmacy*   Follow-Up: Your provider recommends that you schedule a follow-up appointment in 6 MONTHS with Dr. Angelena Form.

## 2019-10-27 ENCOUNTER — Encounter: Payer: Medicare Other | Admitting: Physical Therapy

## 2019-10-27 DIAGNOSIS — M5416 Radiculopathy, lumbar region: Secondary | ICD-10-CM | POA: Diagnosis not present

## 2019-10-29 ENCOUNTER — Ambulatory Visit (INDEPENDENT_AMBULATORY_CARE_PROVIDER_SITE_OTHER): Payer: Medicare Other | Admitting: Physical Therapy

## 2019-10-29 ENCOUNTER — Other Ambulatory Visit: Payer: Self-pay

## 2019-10-29 DIAGNOSIS — M25661 Stiffness of right knee, not elsewhere classified: Secondary | ICD-10-CM | POA: Diagnosis not present

## 2019-10-29 DIAGNOSIS — R2689 Other abnormalities of gait and mobility: Secondary | ICD-10-CM | POA: Diagnosis not present

## 2019-10-29 DIAGNOSIS — M6281 Muscle weakness (generalized): Secondary | ICD-10-CM | POA: Diagnosis not present

## 2019-10-29 DIAGNOSIS — Z89511 Acquired absence of right leg below knee: Secondary | ICD-10-CM

## 2019-10-29 DIAGNOSIS — R2681 Unsteadiness on feet: Secondary | ICD-10-CM

## 2019-10-29 DIAGNOSIS — R293 Abnormal posture: Secondary | ICD-10-CM

## 2019-10-29 DIAGNOSIS — M25662 Stiffness of left knee, not elsewhere classified: Secondary | ICD-10-CM

## 2019-10-29 DIAGNOSIS — Z9181 History of falling: Secondary | ICD-10-CM

## 2019-10-29 NOTE — Therapy (Signed)
[facility] [address] [place], [place], [address] Phone: [phone]   Fax:  [phone]  Physical Therapy Treatment  Patient Details  Name: Jose Garrett MRN: [id number] Date of Birth: [birth year] Referring Provider (PT): Curt Jews, MD   Encounter Date: 10/29/2019   PT End of Session - 10/29/19 1638    Visit Number 7    Number of Visits 25    Date for PT Re-Evaluation 12/21/19    Authorization Type [employer] Medicare    Authorization Time Period $30 co-pay    Authorization - Number of Visits 10    PT Start Time 1430    PT Stop Time 1510    PT Time Calculation (min) 40 min    Equipment Utilized During Treatment Gait belt    Activity Tolerance Patient tolerated treatment well    Behavior During Therapy [facility] for tasks assessed/performed           Past Medical History:  Diagnosis Date  . Amyloidosis ([place])   . Anemia of chronic disease   . Anxiety   . CAD (coronary artery disease)    a. cardiac cath on 11/25/17 with occlusion of the RCA which could have been his event but the RCA could not be engaged. The RCA filled distally from left to right collaterals. There were no severe stenoses in the left coronary system. Medical therapy recommended, started on plavix.  . Chronic combined systolic and diastolic CHF (congestive heart failure) ([place])    a. EF dropped to 35-40% with grade 2 DD by echo 11/2017.  [name] DJD (degenerative joint disease), lumbar   . ESRD (end stage renal disease) ([place])    ESRD- HEMO MWF- [employer]  . GERD (gastroesophageal reflux disease)   . Hyperlipidemia   . Hypertension   . Myocardial infarction ([place])   . Neuropathy   . Peripheral vascular disease ([place])   . Pneumonia   . Restless legs   . S/P TAVR (transcatheter aortic valve replacement) 10/21/2018   26 mm Edwards Sapien 3 Ultra transcatheter heart valve placed via percutaneous right transfemoral approach   . Severe aortic stenosis    severe    Past Surgical  History:  Procedure Laterality Date  . ABDOMINAL AORTOGRAM W/LOWER EXTREMITY Bilateral 07/16/2018   Procedure: ABDOMINAL AORTOGRAM W/LOWER EXTREMITY;  Surgeon: Wellington Hampshire, MD;  Location: [place] [facility];  Service: Cardiovascular;  Laterality: Bilateral;  . ABDOMINAL AORTOGRAM W/LOWER EXTREMITY N/A 04/09/2019   Procedure: ABDOMINAL AORTOGRAM W/LOWER EXTREMITY - Right Lower;  Surgeon: Waynetta Sandy, MD;  Location: [place] [facility];  Service: Cardiovascular;  Laterality: N/A;  . AMPUTATION Left 08/19/2018   Procedure: AMPUTATION LEFT TOES SECOND AND THIRD;  Surgeon: Waynetta Sandy, MD;  Location: [place];  Service: Vascular;  Laterality: Left;  . AMPUTATION Right 05/07/2019   Procedure: AMPUTATION BELOW KNEE;  Surgeon: Serafina Mitchell, MD;  Location: [facility] OR;  Service: Vascular;  Laterality: Right;  . AV FISTULA PLACEMENT Right 12/27/2015   Procedure: Right Arm ARTERIOVENOUS (AV) FISTULA CREATION;  Surgeon: Angelia Mould, MD;  Location: [place];  Service: Vascular;  Laterality: Right;  . CATARACT EXTRACTION W/ INTRAOCULAR LENS  IMPLANT, BILATERAL    . COLONOSCOPY    . ERCP W/ SPHICTEROTOMY    . FEMORAL-POPLITEAL BYPASS GRAFT Left 07/22/2018   Procedure: left FEMORAL TO BELOW KNEE POPLITEAL ARTERY BYPASS GREAFT using 78m removable ring propaten gore graft;  Surgeon: CWaynetta Sandy MD;  [facility]  Service:  Vascular;  Laterality: Left;  . FEMORAL-POPLITEAL BYPASS GRAFT Right 04/10/2019   Procedure: BYPASS GRAFT RIGHT FEMORAL-POPLITEAL ARTERY;  Surgeon: Rosetta Posner, MD;  Location: [place];  Service: Vascular;  Laterality: Right;  . I & D EXTREMITY Right 05/05/2019   Procedure: Revision of  RIGHT  FOOT Amputation;  Surgeon: Waynetta Sandy, MD;  Location: [place];  Service: Vascular;  Laterality: Right;  . INTRAOPERATIVE TRANSTHORACIC ECHOCARDIOGRAM N/A 10/21/2018   Procedure: Intraoperative Transthoracic Echocardiogram;  Surgeon: Sherren Mocha,  MD;  Location: [place];  Service: Open Heart Surgery;  Laterality: N/A;  . IR GENERIC HISTORICAL  01/27/2016   IR FLUORO GUIDE CV LINE RIGHT 01/27/2016 Arne Cleveland, MD [facility]  . IR GENERIC HISTORICAL  01/27/2016   IR US GUIDE VASC ACCESS RIGHT 01/27/2016 Arne Cleveland, MD [facility]  . LAPAROSCOPIC CHOLECYSTECTOMY  03/2011  . LEFT HEART CATH AND CORONARY ANGIOGRAPHY N/A 11/25/2017   Procedure: LEFT HEART CATH AND CORONARY ANGIOGRAPHY;  Surgeon: Martinique, Peter M, MD;  Location: [place] [facility];  Service: Cardiovascular;  Laterality: N/A;  . LEFT HEART CATH AND CORONARY ANGIOGRAPHY N/A 07/24/2018   Procedure: LEFT HEART CATH AND CORONARY ANGIOGRAPHY;  Surgeon: Sherren Mocha, MD;  Location: [place] [facility];  Service: Cardiovascular;  Laterality: N/A;  . RENAL BIOPSY, PERCUTANEOUS Right 09/08/2014  . RIGHT/LEFT HEART CATH AND CORONARY ANGIOGRAPHY N/A 03/25/2018   Procedure: RIGHT/LEFT HEART CATH AND CORONARY ANGIOGRAPHY;  Surgeon: Martinique, Peter M, MD;  Location: [place] [facility];  Service: Cardiovascular;  Laterality: N/A;  . TEE WITHOUT CARDIOVERSION N/A 05/14/2019   Procedure: TRANSESOPHAGEAL ECHOCARDIOGRAM (TEE);  Surgeon: Fay Records, MD;  Location: [facility] ENDOSCOPY;  Service: Cardiovascular;  Laterality: N/A;  . TOE AMPUTATION Left 08/19/2018   2nd & 3rd toes  . TONSILLECTOMY  ~ 1960  . TRANSCATHETER AORTIC VALVE REPLACEMENT, TRANSFEMORAL N/A 10/21/2018   Procedure: TRANSCATHETER AORTIC VALVE REPLACEMENT, TRANSFEMORAL;  Surgeon: Sherren Mocha, MD;  Location: [place];  Service: Open Heart Surgery;  Laterality: N/A;  . TRANSMETATARSAL AMPUTATION Right 04/29/2019   Procedure: RIGHT TRANSMETATARSAL AMPUTATION;  Surgeon: Waynetta Sandy, MD;  Location: [place];  Service: Vascular;  Laterality: Right;  . ULTRASOUND GUIDANCE FOR VASCULAR ACCESS  03/25/2018   Procedure: Ultrasound Guidance For Vascular Access;  Surgeon: Martinique, Peter M, MD;  Location: [place] [facility];  Service:  Cardiovascular;;    There were no vitals filed for this visit.   Subjective Assessment - 10/29/19 1635    Subjective denies any complaints with prosthesis    Patient is accompained by: Family member   dtr, Alysia Penna   Pertinent History Rt TTA, left 2nd & 3rd toe amp 08/19/2018, ESRD, HTN, CHF, aortic stenosis, left fem-pop bypass, peripheral neuropathy, back spasms,    Patient Stated Goals use prosthesis to be active in community, working on car, day travels, walks    Pain Onset More than a month ago    Pain Onset More than a month ago                             [facility] Adult PT Treatment/Exercise - 10/29/19 0001      Transfers   Sit to Stand 5: Supervision;With upper extremity assist;With armrests;From chair/3-in-1;Other (comment)    Stand to Sit 5: Supervision;With upper extremity assist;With armrests;To chair/3-in-1;Other (comment)      Ambulation/Gait   Ambulation/Gait Yes    Ambulation/Gait Assistance 5: Supervision;4: Min assist  Ambulation/Gait Assistance Details supervision with RW, min A with SPC    Ambulation Distance (Feet) 200 Feet   200 ft with RW, then 50 ft with SPC X 2, then 75 ft with [facility]   Assistive device Rolling walker;Prosthesis;Straight cane    Gait Pattern Step-to pattern;Decreased step length - left;Decreased stance time - right;Decreased stride length;Decreased hip/knee flexion - right;Decreased weight shift to right;Right hip hike;Right flexed knee in stance;Left flexed knee in stance;Antalgic;Lateral hip instability;Trunk flexed;Abducted- right    Ambulation Surface Level;Indoor    Ramp 5: Supervision    Ramp Details (indicate cue type and reason) cues and demo for proper technique and sequencing    Curb 5: Supervision    Curb Details (indicate cue type and reason) 6 inch curb cues and demo for proper technique and sequencing      Exercises   Exercises Other Exercises    Other Exercises  Nu step for endurance 8 min L5 UE/LE. Leg press  bilat push 75 lbs X 15 reps then dropped to 62 lbs for 2 sets of 10                    PT Short Term Goals - 10/20/19 1615      PT SHORT TERM GOAL #1   Title Patient demonstrates proper donning of prosthesis & verbalizes proper cleaning.  (All STGs Target Date: 10/23/2019)    Baseline MET 10/20/2019    Time 4    Period Weeks    Status Achieved    Target Date 10/23/19      PT SHORT TERM GOAL #2   Title Patient tolerates prosthesis wear >8 hours total / day with no increased skin issues    Time 4    Period Weeks    Status On-going    Target Date 10/23/19      PT SHORT TERM GOAL #3   Title Standing balance without UE support reaching 5" anteriorly without UE support and picks up item from floor with RW support.    Baseline MET 10/20/2019    Time 4    Period Weeks    Status Achieved    Target Date 10/23/19      PT SHORT TERM GOAL #4   Title Patient ambulates 150' with RW & prosthesis with supervision.    Baseline Met 7/29    Time 4    Period Weeks    Status Achieved    Target Date 10/23/19      PT SHORT TERM GOAL #5   Title Patient negotiates ramps & curbs with rolling walker and stairs with 2 rails with prosthesis with minA.    Time 4    Period Weeks    Status On-going    Target Date 10/23/19             PT Long Term Goals - 09/29/19 2214      PT LONG TERM GOAL #1   Title Patient verbalizes & demonstrates understanding of proper prosthetic care to enable safe utilization of prosthesis.  (All LTGs Target Date: 12/18/2019)    Time 12    Period Weeks    Status On-going    Target Date 12/18/19      PT LONG TERM GOAL #2   Title Patient tolerates prosthesis wear >90% of awake hours without skin issues & limb pain </= 2/10 to enable function throughout his day.    Time 12    Period Weeks    Status On-going  Target Date 12/18/19      PT LONG TERM GOAL #3   Title Berg Balance with prosthesis >/= 45/56 indicating lower fall risk.    Time 12    Period  Weeks    Status On-going    Target Date 12/18/19      PT LONG TERM GOAL #4   Title Patient ambulates 500' with cane or less & prosthesis modified independent for community mobility.    Time 12    Period Weeks    Status On-going    Target Date 12/18/19      PT LONG TERM GOAL #5   Title Patient negotiates ramps, curbs & stairs with single rail with cane or less and prosthesis modified independent to enable community access.    Time 12    Period Weeks    Status On-going    Target Date 12/18/19      PT LONG TERM GOAL #6   Title Patient reports back & residual limb pain increases </= 2 increments on 0-10 scale with standing & gait activities.    Time 12    Period Weeks    Status On-going    Target Date 12/18/19                 Plan - 10/29/19 1648    Clinical Impression Statement Session focused on gait with RW and SPC, and negotiation of curbs and ramps. Then focused on overall leg strength and endurance as tolerated. He is no longer arriving in w/c and is ambulating in with RW.    Personal Factors and Comorbidities Comorbidity 3+;Fitness;Time since onset of injury/illness/exacerbation;Past/Current Experience    Comorbidities Rt TTA, left 2nd & 3rd toe amp 08/19/2018, ESRD, HTN, CHF, aortic stenosis, left fem-pop bypass, peripheral neuropathy, back spasms    Examination-Activity Limitations Lift;Locomotion Level;Stairs;Stand;Transfers    Examination-Participation Restrictions Community Activity;Shop    Stability/Clinical Decision Making Evolving/Moderate complexity    Rehab Potential Good    PT Frequency 2x / week    PT Duration 12 weeks    PT Treatment/Interventions ADLs/Self Care Home Management;Canalith Repostioning;DME Instruction;Gait training;Stair training;Functional mobility training;Therapeutic activities;Therapeutic exercise;Balance training;Neuromuscular re-education;Patient/family education;Prosthetic Training;Manual techniques;Scar mobilization;Passive range of  motion;Dry needling;Joint Manipulations;Vestibular    PT Next Visit Plan check remaining STGs, review prosthetic care paticularly cleaining , review Prosthetic gait instructing in ramps, curbs & stairs with RW, obstacle negotiation, higher level balance challenges    PT Home Exercise Plan .ampsink, see pt instructions section on 09/24/19 note    Consulted and Agree with Plan of Care Patient           Patient will benefit from skilled therapeutic intervention in order to improve the following deficits and impairments:  Abnormal gait, Decreased activity tolerance, Decreased balance, Decreased endurance, Decreased knowledge of use of DME, Decreased mobility, Decreased range of motion, Decreased skin integrity, Decreased scar mobility, Decreased strength, Dizziness, Increased edema, Postural dysfunction, Prosthetic Dependency, Pain  Visit Diagnosis: Other abnormalities of gait and mobility  Muscle weakness (generalized)  Unsteadiness on feet  Abnormal posture  Stiffness of right knee, not elsewhere classified  Stiffness of left knee, not elsewhere classified  Right below-knee amputee ([place])  History of falling     Problem List Patient Active Problem List   Diagnosis Date Noted  . Neuropathy 10/06/2019  . Gait abnormality 08/06/2019  . Back muscle spasm 07/09/2019  . Sleep disturbance   . Nausea without vomiting   . Orthostatic hypotension   . Acute on chronic anemia   .  Chronic combined systolic and diastolic congestive heart failure ([place])   . ESRD on dialysis ([place])   . Right below-knee amputee ([place]) 05/18/2019  . Pressure injury of skin 05/14/2019  . Enterococcal bacteremia 05/13/2019  . Polymorphic ventricular tachycardia ([place]) 10/24/2018  . S/P TAVR (transcatheter aortic valve replacement) 10/21/2018  . Anxiety   . Coronary artery disease involving native coronary artery of native heart without angina pectoris   . Thrombocytopenia ([place])   . PAD (peripheral artery  disease) ([place]) 07/22/2018  . Acute on chronic combined systolic and diastolic CHF (congestive heart failure) ([place]) 11/30/2017  . ESRD (end stage renal disease) on dialysis ([place])   . Iron deficiency anemia 11/22/2015  . AL amyloidosis ([place]) 09/25/2014  . Anemia of chronic disease 09/25/2014  . Essential hypertension, benign 11/06/2013    Silvestre Mesi 10/29/2019, 4:59 PM  [facility] [address] [place], [place], [address] Phone: [phone]   Fax:  [phone]  Name: CAVON NICOLLS MRN: [id number] Date of Birth: Jan 09, 1951

## 2019-11-03 ENCOUNTER — Encounter: Payer: Self-pay | Admitting: Physical Therapy

## 2019-11-03 ENCOUNTER — Ambulatory Visit (INDEPENDENT_AMBULATORY_CARE_PROVIDER_SITE_OTHER): Payer: Medicare Other | Admitting: Physical Therapy

## 2019-11-03 ENCOUNTER — Other Ambulatory Visit: Payer: Self-pay

## 2019-11-03 DIAGNOSIS — R2681 Unsteadiness on feet: Secondary | ICD-10-CM

## 2019-11-03 DIAGNOSIS — M6281 Muscle weakness (generalized): Secondary | ICD-10-CM

## 2019-11-03 DIAGNOSIS — R2689 Other abnormalities of gait and mobility: Secondary | ICD-10-CM | POA: Diagnosis not present

## 2019-11-03 DIAGNOSIS — M25661 Stiffness of right knee, not elsewhere classified: Secondary | ICD-10-CM | POA: Diagnosis not present

## 2019-11-03 DIAGNOSIS — R293 Abnormal posture: Secondary | ICD-10-CM

## 2019-11-03 DIAGNOSIS — M25662 Stiffness of left knee, not elsewhere classified: Secondary | ICD-10-CM

## 2019-11-04 NOTE — Therapy (Signed)
[facility] Physical Therapy [address] [place], [place], [address] Phone: [phone]   Fax:  [phone]  Physical Therapy Treatment  Patient Details  Name: Jose Garrett MRN: [id number] Date of Birth: 01-06-1951 Referring Provider (PT): Gretta Began, MD   Encounter Date: 11/03/2019   PT End of Session - 11/03/19 1736    Visit Number 8    Number of Visits 25    Date for PT Re-Evaluation 12/21/19    Authorization Type [employer] Medicare    Authorization Time Period $30 co-pay    Authorization - Number of Visits 10    PT Start Time 1358    PT Stop Time 1430    PT Time Calculation (min) 32 min    Equipment Utilized During Treatment Gait belt    Activity Tolerance Patient tolerated treatment well    Behavior During Therapy [facility] for tasks assessed/performed           Past Medical History:  Diagnosis Date  . Amyloidosis (HCC)   . Anemia of chronic disease   . Anxiety   . CAD (coronary artery disease)    a. cardiac cath on 11/25/17 with occlusion of the RCA which could have been his event but the RCA could not be engaged. The RCA filled distally from left to right collaterals. There were no severe stenoses in the left coronary system. Medical therapy recommended, started on plavix.  . Chronic combined systolic and diastolic CHF (congestive heart failure) (HCC)    a. EF dropped to 35-40% with grade 2 DD by echo 11/2017.  [name] DJD (degenerative joint disease), lumbar   . ESRD (end stage renal disease) (HCC)    ESRD- HEMO MWF- [facility]  . GERD (gastroesophageal reflux disease)   . Hyperlipidemia   . Hypertension   . Myocardial infarction (HCC)   . Neuropathy   . Peripheral vascular disease (HCC)   . Pneumonia   . Restless legs   . S/P TAVR (transcatheter aortic valve replacement) 10/21/2018   26 mm Edwards Sapien 3 Ultra transcatheter heart valve placed via percutaneous right transfemoral approach   . Severe aortic stenosis    severe    Past Surgical  History:  Procedure Laterality Date  . ABDOMINAL AORTOGRAM W/LOWER EXTREMITY Bilateral 07/16/2018   Procedure: ABDOMINAL AORTOGRAM W/LOWER EXTREMITY;  Surgeon: Iran Ouch, MD;  Location: [facility] INVASIVE CV LAB;  Service: Cardiovascular;  Laterality: Bilateral;  . ABDOMINAL AORTOGRAM W/LOWER EXTREMITY N/A 04/09/2019   Procedure: ABDOMINAL AORTOGRAM W/LOWER EXTREMITY - Right Lower;  Surgeon: Maeola Harman, MD;  Location: [facility] INVASIVE CV LAB;  Service: Cardiovascular;  Laterality: N/A;  . AMPUTATION Left 08/19/2018   Procedure: AMPUTATION LEFT TOES SECOND AND THIRD;  Surgeon: Maeola Harman, MD;  Location: [facility] OR;  Service: Vascular;  Laterality: Left;  . AMPUTATION Right 05/07/2019   Procedure: AMPUTATION BELOW KNEE;  Surgeon: Nada Libman, MD;  Location: [facility] OR;  Service: Vascular;  Laterality: Right;  . AV FISTULA PLACEMENT Right 12/27/2015   Procedure: Right Arm ARTERIOVENOUS (AV) FISTULA CREATION;  Surgeon: Chuck Hint, MD;  Location: [facility] OR;  Service: Vascular;  Laterality: Right;  . CATARACT EXTRACTION W/ INTRAOCULAR LENS  IMPLANT, BILATERAL    . COLONOSCOPY    . ERCP W/ SPHICTEROTOMY    . FEMORAL-POPLITEAL BYPASS GRAFT Left 07/22/2018   Procedure: left FEMORAL TO BELOW KNEE POPLITEAL ARTERY BYPASS GREAFT using 33mm removable ring propaten gore graft;  Surgeon: Maeola Harman, MD;  Location: [facility] OR;  Service:  Vascular;  Laterality: Left;  . FEMORAL-POPLITEAL BYPASS GRAFT Right 04/10/2019   Procedure: BYPASS GRAFT RIGHT FEMORAL-POPLITEAL ARTERY;  Surgeon: Rosetta Posner, MD;  Location: [place];  Service: Vascular;  Laterality: Right;  . I & D EXTREMITY Right 05/05/2019   Procedure: Revision of  RIGHT  FOOT Amputation;  Surgeon: Waynetta Sandy, MD;  Location: [place];  Service: Vascular;  Laterality: Right;  . INTRAOPERATIVE TRANSTHORACIC ECHOCARDIOGRAM N/A 10/21/2018   Procedure: Intraoperative Transthoracic Echocardiogram;  Surgeon: Sherren Mocha,  MD;  Location: [place];  Service: Open Heart Surgery;  Laterality: N/A;  . IR GENERIC HISTORICAL  01/27/2016   IR FLUORO GUIDE CV LINE RIGHT 01/27/2016 Arne Cleveland, MD [facility]-INTERV RAD  . IR GENERIC HISTORICAL  01/27/2016   IR US GUIDE VASC ACCESS RIGHT 01/27/2016 Arne Cleveland, MD [facility]INTER[facility]  . LAPAROSCOPIC CHOLECYSTECTOMY  03/2011  . LEFT HEART CATH AND CORONARY ANGIOGRAPHY N/A 11/25/2017   Procedure: LEFT HEART CATH AND CORONARY ANGIOGRAPHY;  Surgeon: Martinique, Peter M, MD;  Location: [place] CV LAB;  Service: Cardiovascular;  Laterality: N/A;  . LEFT HEART CATH AND CORONARY ANGIOGRAPHY N/A 07/24/2018   Procedure: LEFT HEART CATH AND CORONARY ANGIOGRAPHY;  Surgeon: Sherren Mocha, MD;  Location: [place] CV LAB;  Service: Cardiovascular;  Laterality: N/A;  . RENAL BIOPSY, PERCUTANEOUS Right 09/08/2014  . RIGHT/LEFT HEART CATH AND CORONARY ANGIOGRAPHY N/A 03/25/2018   Procedure: RIGHT/LEFT HEART CATH AND CORONARY ANGIOGRAPHY;  Surgeon: Martinique, Peter M, MD;  Location: [place] CV LAB;  Service: Cardiovascular;  Laterality: N/A;  . TEE WITHOUT CARDIOVERSION N/A 05/14/2019   Procedure: TRANSESOPHAGEAL ECHOCARDIOGRAM (TEE);  Surgeon: Fay Records, MD;  Location: [facility] ENDOSCOPY;  Service: Cardiovascular;  Laterality: N/A;  . TOE AMPUTATION Left 08/19/2018   2nd & 3rd toes  . TONSILLECTOMY  ~ 1960  . TRANSCATHETER AORTIC VALVE REPLACEMENT, TRANSFEMORAL N/A 10/21/2018   Procedure: TRANSCATHETER AORTIC VALVE REPLACEMENT, TRANSFEMORAL;  Surgeon: Sherren Mocha, MD;  Location: [place];  Service: Open Heart Surgery;  Laterality: N/A;  . TRANSMETATARSAL AMPUTATION Right 04/29/2019   Procedure: RIGHT TRANSMETATARSAL AMPUTATION;  Surgeon: Waynetta Sandy, MD;  Location: [place];  Service: Vascular;  Laterality: Right;  . ULTRASOUND GUIDANCE FOR VASCULAR ACCESS  03/25/2018   Procedure: Ultrasound Guidance For Vascular Access;  Surgeon: Martinique, Peter M, MD;  Location: [place] CV LAB;  Service:  Cardiovascular;;    There were no vitals filed for this visit.   Subjective Assessment - 11/03/19 1400    Subjective He has been wearing prosthesis daily including to dialysis up to 6 hours. He puts prosthesis on limb ~1 hour after arising. No falls.    Patient is accompained by: Family member   dtr, Alysia Penna   Pertinent History Rt TTA, left 2nd & 3rd toe amp 08/19/2018, ESRD, HTN, CHF, aortic stenosis, left fem-pop bypass, peripheral neuropathy, back spasms,    Patient Stated Goals use prosthesis to be active in community, working on car, day travels, walks    Currently in Pain? Yes    Pain Score 6    best 0/10 and worst 7/10   Pain Location Leg   phantom pain   Pain Orientation Right    Pain Descriptors / Indicators Shooting    Pain Type Phantom pain    Pain Onset More than a month ago    Pain Frequency Intermittent    Pain Score 6   in last week, best 0/10 and worst 9/10   Pain Location Back  Pain Orientation Lower;Mid    Pain Descriptors / Indicators Aching;Spasm    Pain Type Chronic pain    Pain Onset More than a month ago    Pain Frequency Intermittent    Aggravating Factors  laying down too long like dialysis    Pain Relieving Factors sitting up                             [facility] Adult PT Treatment/Exercise - 11/03/19 1400      Transfers   Sit to Stand 5: Supervision;With upper extremity assist;With armrests;From chair/3-in-1;Other (comment)    Stand to Sit 5: Supervision;With upper extremity assist;With armrests;To chair/3-in-1;Other (comment)      Ambulation/Gait   Ambulation/Gait Yes    Ambulation/Gait Assistance 5: Supervision;4: Min assist    Ambulation/Gait Assistance Details verbal cues on weight shift over stace limb and step through pattern    Ambulation Distance (Feet) 200 Feet    Assistive device Rolling walker;Prosthesis    Gait Pattern --    Ambulation Surface Level;Indoor    Ramp 5: Supervision    Ramp Details (indicate cue type and  reason) cues and demo for proper technique and sequencing    Curb 5: Supervision    Curb Details (indicate cue type and reason) verbal cues on sequence      Exercises   Exercises --    Other Exercises  --      Prosthetics   Prosthetic Care Comments  Fall risk when prosthesis off and he is out of bed.   PT educated on need to adjust ply socks based on limb volume: larger volume just prior to dialysis with need for more ply socks and smaller limb volume after dialysis with increased ply socks. He will need to decrease ply socks with addition of pre-tibial pads.       Current prosthetic wear tolerance (days/week)  daily    Current prosthetic wear tolerance (#hours/day)  reports 4-5 hours 2x/day on non-dailysis days and 6 hours 1x/day on dialysis days.   PT recommended 5 hours 2x/day  starting first wear upon arising and removing 2 hours between wear.     Residual limb condition  no wounds or skin issues. normal color, dry distally,     Education Provided Skin check;Residual limb care;Prosthetic cleaning;Proper Donning;Proper wear schedule/adjustment;Other (comment)   see prosthetic care comments   Person(s) Educated Patient    Education Method Explanation;Demonstration;Verbal cues    Education Method Verbalized understanding;Verbal cues required;Needs further instruction    Donning Prosthesis Supervision                    PT Short Term Goals - 11/03/19 1736      PT SHORT TERM GOAL #1   Title Patient demonstrates proper donning of prosthesis & verbalizes proper cleaning.  (All STGs Target Date: 10/23/2019)    Baseline MET 10/20/2019    Time 4    Period Weeks    Status Achieved    Target Date 10/23/19      PT SHORT TERM GOAL #2   Title Patient tolerates prosthesis wear >8 hours total / day with no increased skin issues    Baseline Partially MET 11/03/2019    Time 4    Period Weeks    Status Partially Met    Target Date 10/23/19      PT SHORT TERM GOAL #3   Title Standing  balance without UE support reaching 5"  anteriorly without UE support and picks up item from floor with RW support.    Baseline MET 10/20/2019    Time 4    Period Weeks    Status Achieved    Target Date 10/23/19      PT SHORT TERM GOAL #4   Title Patient ambulates 150' with RW & prosthesis with supervision.    Baseline Met 7/29    Time 4    Period Weeks    Status Achieved    Target Date 10/23/19      PT SHORT TERM GOAL #5   Title Patient negotiates ramps & curbs with rolling walker and stairs with 2 rails with prosthesis with minA.    Baseline MET 11/03/2019    Time 4    Period Weeks    Status Achieved    Target Date 10/23/19             PT Short Term Goals - 11/04/19 0733      PT SHORT TERM GOAL #1   Title Patient verbalizes understanding of adjusting ply socks with limb volume changes including with dialysis.  (All STGs Target Date:11/20/2019)    Time 4    Period Weeks    Status New    Target Date 11/20/19      PT SHORT TERM GOAL #2   Title Patient tolerates prosthesis wear >90% awake hours / day on non-dialysis days with no increased skin issues    Time 4    Period Weeks    Status Revised    Target Date 11/20/19      PT SHORT TERM GOAL #3   Title Berg Balance >36/56    Time 4    Period Weeks    Status New    Target Date 11/20/19      PT SHORT TERM GOAL #4   Title Patient ambulates 150' with cane & prosthesis with supervision.    Time 4    Period Weeks    Status Revised    Target Date 11/20/19      PT SHORT TERM GOAL #5   Title Patient negotiates ramps & curbs with cane with minimal assist and stairs with 1 rail/cane with supervision with prosthesis.    Time 4    Period Weeks    Status Revised    Target Date 11/20/19             PT Long Term Goals - 09/29/19 2214      PT LONG TERM GOAL #1   Title Patient verbalizes & demonstrates understanding of proper prosthetic care to enable safe utilization of prosthesis.  (All LTGs Target Date: 12/18/2019)      Time 12    Period Weeks    Status On-going    Target Date 12/18/19      PT LONG TERM GOAL #2   Title Patient tolerates prosthesis wear >90% of awake hours without skin issues & limb pain </= 2/10 to enable function throughout his day.    Time 12    Period Weeks    Status On-going    Target Date 12/18/19      PT LONG TERM GOAL #3   Title Berg Balance with prosthesis >/= 45/56 indicating lower fall risk.    Time 12    Period Weeks    Status On-going    Target Date 12/18/19      PT LONG TERM GOAL #4   Title Patient ambulates 500' with cane or less & prosthesis  modified independent for community mobility.    Time 12    Period Weeks    Status On-going    Target Date 12/18/19      PT LONG TERM GOAL #5   Title Patient negotiates ramps, curbs & stairs with single rail with cane or less and prosthesis modified independent to enable community access.    Time 12    Period Weeks    Status On-going    Target Date 12/18/19      PT LONG TERM GOAL #6   Title Patient reports back & residual limb pain increases </= 2 increments on 0-10 scale with standing & gait activities.    Time 12    Period Weeks    Status On-going    Target Date 12/18/19                 Plan - 11/03/19 1737    Clinical Impression Statement Patient met remaining STGs.  He would benefit from pre-tibial pads in prosthetic socket. Patient has increased wear without skin issues.    Personal Factors and Comorbidities Comorbidity 3+;Fitness;Time since onset of injury/illness/exacerbation;Past/Current Experience    Comorbidities Rt TTA, left 2nd & 3rd toe amp 08/19/2018, ESRD, HTN, CHF, aortic stenosis, left fem-pop bypass, peripheral neuropathy, back spasms    Examination-Activity Limitations Lift;Locomotion Level;Stairs;Stand;Transfers    Examination-Participation Restrictions Community Activity;Shop    Stability/Clinical Decision Making Evolving/Moderate complexity    Rehab Potential Good    PT Frequency 2x /  week    PT Duration 12 weeks    PT Treatment/Interventions ADLs/Self Care Home Management;Canalith Repostioning;DME Instruction;Gait training;Stair training;Functional mobility training;Therapeutic activities;Therapeutic exercise;Balance training;Neuromuscular re-education;Patient/family education;Prosthetic Training;Manual techniques;Scar mobilization;Passive range of motion;Dry needling;Joint Manipulations;Vestibular    PT Next Visit Plan work towards updated STGs, review prosthetic care paticularly cleaining , review Prosthetic gait instructing in ramps, curbs & stairs with RW, obstacle negotiation, higher level balance challenges    PT Home Exercise Plan .ampsink, see pt instructions section on 09/24/19 note    Consulted and Agree with Plan of Care Patient           Patient will benefit from skilled therapeutic intervention in order to improve the following deficits and impairments:  Abnormal gait, Decreased activity tolerance, Decreased balance, Decreased endurance, Decreased knowledge of use of DME, Decreased mobility, Decreased range of motion, Decreased skin integrity, Decreased scar mobility, Decreased strength, Dizziness, Increased edema, Postural dysfunction, Prosthetic Dependency, Pain  Visit Diagnosis: Muscle weakness (generalized)  Other abnormalities of gait and mobility  Unsteadiness on feet  Abnormal posture  Stiffness of right knee, not elsewhere classified  Stiffness of left knee, not elsewhere classified     Problem List Patient Active Problem List   Diagnosis Date Noted  . Neuropathy 10/06/2019  . Gait abnormality 08/06/2019  . Back muscle spasm 07/09/2019  . Sleep disturbance   . Nausea without vomiting   . Orthostatic hypotension   . Acute on chronic anemia   . Chronic combined systolic and diastolic congestive heart failure (HCC)   . ESRD on dialysis (HCC)   . Right below-knee amputee (HCC) 05/18/2019  . Pressure injury of skin 05/14/2019  .  Enterococcal bacteremia 05/13/2019  . Polymorphic ventricular tachycardia (HCC) 10/24/2018  . S/P TAVR (transcatheter aortic valve replacement) 10/21/2018  . Anxiety   . Coronary artery disease involving native coronary artery of native heart without angina pectoris   . Thrombocytopenia (HCC)   . PAD (peripheral artery disease) (HCC) 07/22/2018  . Acute on chronic combined systolic  and diastolic CHF (congestive heart failure) ([place]) 11/30/2017  . ESRD (end stage renal disease) on dialysis ([place])   . Iron deficiency anemia 11/22/2015  . AL amyloidosis ([place]) 09/25/2014  . Anemia of chronic disease 09/25/2014  . Essential hypertension, benign 11/06/2013    Jamey Reas PT, DPT 11/04/2019, 7:32 AM  [facility] Physical Therapy [address] [place], [place], [address] Phone: [phone]   Fax:  [phone]  Name: Jose Garrett MRN: [id number] Date of Birth: May 03, 1950

## 2019-11-05 ENCOUNTER — Encounter: Payer: Medicare Other | Admitting: Physical Therapy

## 2019-11-10 ENCOUNTER — Encounter: Payer: Medicare Other | Admitting: Physical Therapy

## 2019-11-10 DIAGNOSIS — I129 Hypertensive chronic kidney disease with stage 1 through stage 4 chronic kidney disease, or unspecified chronic kidney disease: Secondary | ICD-10-CM | POA: Diagnosis not present

## 2019-11-10 DIAGNOSIS — Z992 Dependence on renal dialysis: Secondary | ICD-10-CM | POA: Diagnosis not present

## 2019-11-10 DIAGNOSIS — N186 End stage renal disease: Secondary | ICD-10-CM | POA: Diagnosis not present

## 2019-11-12 ENCOUNTER — Other Ambulatory Visit: Payer: Self-pay

## 2019-11-12 ENCOUNTER — Ambulatory Visit (INDEPENDENT_AMBULATORY_CARE_PROVIDER_SITE_OTHER): Payer: Medicare Other | Admitting: Physical Therapy

## 2019-11-12 DIAGNOSIS — R262 Difficulty in walking, not elsewhere classified: Secondary | ICD-10-CM

## 2019-11-12 DIAGNOSIS — R2689 Other abnormalities of gait and mobility: Secondary | ICD-10-CM | POA: Diagnosis not present

## 2019-11-12 DIAGNOSIS — R2681 Unsteadiness on feet: Secondary | ICD-10-CM

## 2019-11-12 DIAGNOSIS — M545 Low back pain, unspecified: Secondary | ICD-10-CM

## 2019-11-12 DIAGNOSIS — G8929 Other chronic pain: Secondary | ICD-10-CM

## 2019-11-12 DIAGNOSIS — M6281 Muscle weakness (generalized): Secondary | ICD-10-CM | POA: Diagnosis not present

## 2019-11-12 DIAGNOSIS — M25661 Stiffness of right knee, not elsewhere classified: Secondary | ICD-10-CM

## 2019-11-12 DIAGNOSIS — Z9181 History of falling: Secondary | ICD-10-CM

## 2019-11-12 DIAGNOSIS — Z89511 Acquired absence of right leg below knee: Secondary | ICD-10-CM

## 2019-11-12 DIAGNOSIS — M25662 Stiffness of left knee, not elsewhere classified: Secondary | ICD-10-CM

## 2019-11-12 DIAGNOSIS — R293 Abnormal posture: Secondary | ICD-10-CM

## 2019-11-12 NOTE — Therapy (Signed)
[facility] Physical Therapy [address] [place], [place], [address] Phone: [phone]   Fax:  [phone]  Physical Therapy Treatment  Patient Details  Name: Jose Garrett MRN: [id number] Date of Birth: [birth year] Referring Provider (PT): Curt Jews, MD   Encounter Date: 11/12/2019   PT End of Session - 11/12/19 1423    Visit Number 9    Number of Visits 25    Date for PT Re-Evaluation 12/21/19    Authorization Type [employer] Medicare    Authorization Time Period $30 co-pay    Authorization - Number of Visits 10    PT Start Time 1347    PT Stop Time 1425    PT Time Calculation (min) 38 min    Equipment Utilized During Treatment Gait belt    Activity Tolerance Patient tolerated treatment well    Behavior During Therapy WFL for tasks assessed/performed           Past Medical History:  Diagnosis Date  . Amyloidosis ([place])   . Anemia of chronic disease   . Anxiety   . CAD (coronary artery disease)    a. cardiac cath on 11/25/17 with occlusion of the RCA which could have been his event but the RCA could not be engaged. The RCA filled distally from left to right collaterals. There were no severe stenoses in the left coronary system. Medical therapy recommended, started on plavix.  . Chronic combined systolic and diastolic CHF (congestive heart failure) ([place])    a. EF dropped to 35-40% with grade 2 DD by echo 11/2017.  [name] DJD (degenerative joint disease), lumbar   . ESRD (end stage renal disease) ([place])    ESRD- HEMO MWF- [employer]  . GERD (gastroesophageal reflux disease)   . Hyperlipidemia   . Hypertension   . Myocardial infarction ([place])   . Neuropathy   . Peripheral vascular disease ([place])   . Pneumonia   . Restless legs   . S/P TAVR (transcatheter aortic valve replacement) 10/21/2018   26 mm Edwards Sapien 3 Ultra transcatheter heart valve placed via percutaneous right transfemoral approach   . Severe aortic stenosis    severe    Past Surgical  History:  Procedure Laterality Date  . ABDOMINAL AORTOGRAM W/LOWER EXTREMITY Bilateral 07/16/2018   Procedure: ABDOMINAL AORTOGRAM W/LOWER EXTREMITY;  Surgeon: Wellington Hampshire, MD;  Location: [place] [facility];  Service: Cardiovascular;  Laterality: Bilateral;  . ABDOMINAL AORTOGRAM W/LOWER EXTREMITY N/A 04/09/2019   Procedure: ABDOMINAL AORTOGRAM W/LOWER EXTREMITY - Right Lower;  Surgeon: Waynetta Sandy, MD;  Location: [place] [facility];  Service: Cardiovascular;  Laterality: N/A;  . AMPUTATION Left 08/19/2018   Procedure: AMPUTATION LEFT TOES SECOND AND THIRD;  Surgeon: Waynetta Sandy, MD;  Location: [place];  Service: Vascular;  Laterality: Left;  . AMPUTATION Right 05/07/2019   Procedure: AMPUTATION BELOW KNEE;  Surgeon: Serafina Mitchell, MD;  Location: [facility] OR;  Service: Vascular;  Laterality: Right;  . AV FISTULA PLACEMENT Right 12/27/2015   Procedure: Right Arm ARTERIOVENOUS (AV) FISTULA CREATION;  Surgeon: Angelia Mould, MD;  Location: [place];  Service: Vascular;  Laterality: Right;  . CATARACT EXTRACTION W/ INTRAOCULAR LENS  IMPLANT, BILATERAL    . COLONOSCOPY    . ERCP W/ SPHICTEROTOMY    . FEMORAL-POPLITEAL BYPASS GRAFT Left 07/22/2018   Procedure: left FEMORAL TO BELOW KNEE POPLITEAL ARTERY BYPASS GREAFT using 73mm removable ring propaten gore graft;  Surgeon: Waynetta Sandy, MD;  Location: [place];  Service:  Vascular;  Laterality: Left;  . FEMORAL-POPLITEAL BYPASS GRAFT Right 04/10/2019   Procedure: BYPASS GRAFT RIGHT FEMORAL-POPLITEAL ARTERY;  Surgeon: Rosetta Posner, MD;  Location: [place];  Service: Vascular;  Laterality: Right;  . I & D EXTREMITY Right 05/05/2019   Procedure: Revision of  RIGHT  FOOT Amputation;  Surgeon: Waynetta Sandy, MD;  Location: [place];  Service: Vascular;  Laterality: Right;  . INTRAOPERATIVE TRANSTHORACIC ECHOCARDIOGRAM N/A 10/21/2018   Procedure: Intraoperative Transthoracic Echocardiogram;  Surgeon: Sherren Mocha,  MD;  Location: [place];  Service: Open Heart Surgery;  Laterality: N/A;  . IR GENERIC HISTORICAL  01/27/2016   IR FLUORO GUIDE CV LINE RIGHT 01/27/2016 Arne Cleveland, MD [facility]  . IR GENERIC HISTORICAL  01/27/2016   IR US GUIDE VASC ACCESS RIGHT 01/27/2016 Arne Cleveland, MD [facility]  . LAPAROSCOPIC CHOLECYSTECTOMY  03/2011  . LEFT HEART CATH AND CORONARY ANGIOGRAPHY N/A 11/25/2017   Procedure: LEFT HEART CATH AND CORONARY ANGIOGRAPHY;  Surgeon: Martinique, Peter M, MD;  Location: [place] [facility];  Service: Cardiovascular;  Laterality: N/A;  . LEFT HEART CATH AND CORONARY ANGIOGRAPHY N/A 07/24/2018   Procedure: LEFT HEART CATH AND CORONARY ANGIOGRAPHY;  Surgeon: Sherren Mocha, MD;  Location: [place] [facility];  Service: Cardiovascular;  Laterality: N/A;  . RENAL BIOPSY, PERCUTANEOUS Right 09/08/2014  . RIGHT/LEFT HEART CATH AND CORONARY ANGIOGRAPHY N/A 03/25/2018   Procedure: RIGHT/LEFT HEART CATH AND CORONARY ANGIOGRAPHY;  Surgeon: Martinique, Peter M, MD;  Location: [place] [facility];  Service: Cardiovascular;  Laterality: N/A;  . TEE WITHOUT CARDIOVERSION N/A 05/14/2019   Procedure: TRANSESOPHAGEAL ECHOCARDIOGRAM (TEE);  Surgeon: Fay Records, MD;  Location: [facility] ENDOSCOPY;  Service: Cardiovascular;  Laterality: N/A;  . TOE AMPUTATION Left 08/19/2018   2nd & 3rd toes  . TONSILLECTOMY  ~ 1960  . TRANSCATHETER AORTIC VALVE REPLACEMENT, TRANSFEMORAL N/A 10/21/2018   Procedure: TRANSCATHETER AORTIC VALVE REPLACEMENT, TRANSFEMORAL;  Surgeon: Sherren Mocha, MD;  Location: [place];  Service: Open Heart Surgery;  Laterality: N/A;  . TRANSMETATARSAL AMPUTATION Right 04/29/2019   Procedure: RIGHT TRANSMETATARSAL AMPUTATION;  Surgeon: Waynetta Sandy, MD;  Location: [place];  Service: Vascular;  Laterality: Right;  . ULTRASOUND GUIDANCE FOR VASCULAR ACCESS  03/25/2018   Procedure: Ultrasound Guidance For Vascular Access;  Surgeon: Martinique, Peter M, MD;  Location: [place] [facility];  Service:  Cardiovascular;;    There were no vitals filed for this visit.   Subjective Assessment - 11/12/19 1401    Subjective He relays he had pads placed in his socket, he says this ovrerall feels okay but he does notice increased pressure at the back of his leg. He denies pain today.    Patient is accompained by: Family member   dtr, Alysia Penna   Pertinent History Rt TTA, left 2nd & 3rd toe amp 08/19/2018, ESRD, HTN, CHF, aortic stenosis, left fem-pop bypass, peripheral neuropathy, back spasms,    Patient Stated Goals use prosthesis to be active in community, working on car, day travels, walks    Pain Onset More than a month ago    Pain Onset More than a month ago                             [facility] Adult PT Treatment/Exercise - 11/12/19 0001      Transfers   Sit to Stand 5: Supervision;With upper extremity assist;With armrests;From chair/3-in-1;Other (comment)    Stand to Sit 5: Supervision;With  upper extremity assist;With armrests;To chair/3-in-1;Other (comment)      Ambulation/Gait   Ambulation/Gait Yes    Ambulation/Gait Assistance 5: Supervision;4: Min assist    Ambulation Distance (Feet) 200 Feet    Assistive device Rolling walker;Prosthesis    Ambulation Surface Level;Indoor    Ramp 5: Supervision    Ramp Details (indicate cue type and reason) cues and demo for proper technique and sequencing    Curb 5: Supervision      Neuro Re-ed    Neuro Re-ed Details  balance at sink for feet together eyes open, progressed to head turns, then feet apart eyes closed      Exercises   Other Exercises  Recumbant bike 5 min but difficulty keeping his feet in the pedals. Leg press bilat push 50 lbs 3X10. Seated hamstring stretch 30 sec x3. Standing hip abd with red band 2X10 bilat      Prosthetics   Prosthetic Care Comments  proper sock adjustment                    PT Short Term Goals - 11/04/19 0733      PT SHORT TERM GOAL #1   Title Patient verbalizes  understanding of adjusting ply socks with limb volume changes including with dialysis.  (All STGs Target Date:11/20/2019)    Time 4    Period Weeks    Status New    Target Date 11/20/19      PT SHORT TERM GOAL #2   Title Patient tolerates prosthesis wear >90% awake hours / day on non-dialysis days with no increased skin issues    Time 4    Period Weeks    Status Revised    Target Date 11/20/19      PT SHORT TERM GOAL #3   Title Berg Balance >36/56    Time 4    Period Weeks    Status New    Target Date 11/20/19      PT SHORT TERM GOAL #4   Title Patient ambulates 150' with cane & prosthesis with supervision.    Time 4    Period Weeks    Status Revised    Target Date 11/20/19      PT SHORT TERM GOAL #5   Title Patient negotiates ramps & curbs with cane with minimal assist and stairs with 1 rail/cane with supervision with prosthesis.    Time 4    Period Weeks    Status Revised    Target Date 11/20/19             PT Long Term Goals - 09/29/19 2214      PT LONG TERM GOAL #1   Title Patient verbalizes & demonstrates understanding of proper prosthetic care to enable safe utilization of prosthesis.  (All LTGs Target Date: 12/18/2019)    Time 12    Period Weeks    Status On-going    Target Date 12/18/19      PT LONG TERM GOAL #2   Title Patient tolerates prosthesis wear >90% of awake hours without skin issues & limb pain </= 2/10 to enable function throughout his day.    Time 12    Period Weeks    Status On-going    Target Date 12/18/19      PT LONG TERM GOAL #3   Title Berg Balance with prosthesis >/= 45/56 indicating lower fall risk.    Time 12    Period Weeks    Status On-going  Target Date 12/18/19      PT LONG TERM GOAL #4   Title Patient ambulates 500' with cane or less & prosthesis modified independent for community mobility.    Time 12    Period Weeks    Status On-going    Target Date 12/18/19      PT LONG TERM GOAL #5   Title Patient negotiates  ramps, curbs & stairs with single rail with cane or less and prosthesis modified independent to enable community access.    Time 12    Period Weeks    Status On-going    Target Date 12/18/19      PT LONG TERM GOAL #6   Title Patient reports back & residual limb pain increases </= 2 increments on 0-10 scale with standing & gait activities.    Time 12    Period Weeks    Status On-going    Target Date 12/18/19                 Plan - 11/12/19 1424    Clinical Impression Statement Session focused on gait, ramp, curb, leg strength and endurance, and overall balance. He had increased pain and pressure noted in standing today and it was noted that he was too far down in his socket. He was not wearing any socks so had him apply 3 ply sock to start and reminded him to add socks if he hears to many clicks or is having too much anterior patella pain/pressure. This was subjectively better after adding sock.    Personal Factors and Comorbidities Comorbidity 3+;Fitness;Time since onset of injury/illness/exacerbation;Past/Current Experience    Comorbidities Rt TTA, left 2nd & 3rd toe amp 08/19/2018, ESRD, HTN, CHF, aortic stenosis, left fem-pop bypass, peripheral neuropathy, back spasms    Examination-Activity Limitations Lift;Locomotion Level;Stairs;Stand;Transfers    Examination-Participation Restrictions Community Activity;Shop    Stability/Clinical Decision Making Evolving/Moderate complexity    Rehab Potential Good    PT Frequency 2x / week    PT Duration 12 weeks    PT Treatment/Interventions ADLs/Self Care Home Management;Canalith Repostioning;DME Instruction;Gait training;Stair training;Functional mobility training;Therapeutic activities;Therapeutic exercise;Balance training;Neuromuscular re-education;Patient/family education;Prosthetic Training;Manual techniques;Scar mobilization;Passive range of motion;Dry needling;Joint Manipulations;Vestibular    PT Next Visit Plan work towards updated  STGs, review prosthetic care paticularly cleaining , review Prosthetic gait instructing in ramps, curbs & stairs with RW, obstacle negotiation, higher level balance challenges    PT Home Exercise Plan .ampsink, see pt instructions section on 09/24/19 note    Consulted and Agree with Plan of Care Patient           Patient will benefit from skilled therapeutic intervention in order to improve the following deficits and impairments:  Abnormal gait, Decreased activity tolerance, Decreased balance, Decreased endurance, Decreased knowledge of use of DME, Decreased mobility, Decreased range of motion, Decreased skin integrity, Decreased scar mobility, Decreased strength, Dizziness, Increased edema, Postural dysfunction, Prosthetic Dependency, Pain  Visit Diagnosis: Muscle weakness (generalized)  Other abnormalities of gait and mobility  Unsteadiness on feet  Abnormal posture  Stiffness of right knee, not elsewhere classified  Stiffness of left knee, not elsewhere classified  Right below-knee amputee ([place])  History of falling  Chronic bilateral low back pain without sciatica  Difficulty in walking, not elsewhere classified     Problem List Patient Active Problem List   Diagnosis Date Noted  . Neuropathy 10/06/2019  . Gait abnormality 08/06/2019  . Back muscle spasm 07/09/2019  . Sleep disturbance   . Nausea without vomiting   .  Orthostatic hypotension   . Acute on chronic anemia   . Chronic combined systolic and diastolic congestive heart failure ([place])   . ESRD on dialysis ([place])   . Right below-knee amputee ([place]) 05/18/2019  . Pressure injury of skin 05/14/2019  . Enterococcal bacteremia 05/13/2019  . Polymorphic ventricular tachycardia ([place]) 10/24/2018  . S/P TAVR (transcatheter aortic valve replacement) 10/21/2018  . Anxiety   . Coronary artery disease involving native coronary artery of native heart without angina pectoris   . Thrombocytopenia ([place])   . PAD (peripheral  artery disease) ([place]) 07/22/2018  . Acute on chronic combined systolic and diastolic CHF (congestive heart failure) ([place]) 11/30/2017  . ESRD (end stage renal disease) on dialysis ([place])   . Iron deficiency anemia 11/22/2015  . AL amyloidosis ([place]) 09/25/2014  . Anemia of chronic disease 09/25/2014  . Essential hypertension, benign 11/06/2013    Silvestre Mesi 11/12/2019, 2:31 PM  [facility] Physical Therapy [address] [place], [place], [address] Phone: [phone]   Fax:  [phone]  Name: Jose Garrett MRN: [id number] Date of Birth: Jul 11, 1950

## 2019-11-17 ENCOUNTER — Encounter: Payer: Self-pay | Admitting: Physical Therapy

## 2019-11-17 ENCOUNTER — Ambulatory Visit: Payer: Medicare Other | Admitting: Physical Therapy

## 2019-11-17 ENCOUNTER — Other Ambulatory Visit: Payer: Self-pay

## 2019-11-17 DIAGNOSIS — R293 Abnormal posture: Secondary | ICD-10-CM

## 2019-11-17 DIAGNOSIS — R2689 Other abnormalities of gait and mobility: Secondary | ICD-10-CM

## 2019-11-17 DIAGNOSIS — M25662 Stiffness of left knee, not elsewhere classified: Secondary | ICD-10-CM

## 2019-11-17 DIAGNOSIS — R2681 Unsteadiness on feet: Secondary | ICD-10-CM

## 2019-11-17 DIAGNOSIS — M6281 Muscle weakness (generalized): Secondary | ICD-10-CM | POA: Diagnosis not present

## 2019-11-17 DIAGNOSIS — M25661 Stiffness of right knee, not elsewhere classified: Secondary | ICD-10-CM

## 2019-11-17 NOTE — Therapy (Signed)
[facility] Physical Therapy [address] [place], [place], [address] Phone: [phone]   Fax:  [phone]  Physical Therapy Treatment & 10th Visit Progress Note  Patient Details  Name: Jose Garrett MRN: [id number] Date of Birth: Oct 12, 1950 Referring Provider (PT): Curt Jews, MD   Encounter Date: 11/17/2019   Progress Note Reporting Period 09/22/2019 to 11/17/2019  See note below for Objective Data and Assessment of Progress/Goals.        PT End of Session - 11/17/19 1350    Visit Number 10    Number of Visits 25    Date for PT Re-Evaluation 12/21/19    Authorization Type [employer] Medicare    Authorization Time Period $30 co-pay    Authorization - Number of Visits 10    PT Start Time 1950    PT Stop Time 1430    PT Time Calculation (min) 42 min    Equipment Utilized During Treatment Gait belt    Activity Tolerance Patient tolerated treatment well    Behavior During Therapy WFL for tasks assessed/performed           Past Medical History:  Diagnosis Date  . Amyloidosis ([place])   . Anemia of chronic disease   . Anxiety   . CAD (coronary artery disease)    a. cardiac cath on 11/25/17 with occlusion of the RCA which could have been his event but the RCA could not be engaged. The RCA filled distally from left to right collaterals. There were no severe stenoses in the left coronary system. Medical therapy recommended, started on plavix.  . Chronic combined systolic and diastolic CHF (congestive heart failure) ([place])    a. EF dropped to 35-40% with grade 2 DD by echo 11/2017.  [name] DJD (degenerative joint disease), lumbar   . ESRD (end stage renal disease) ([place])    ESRD- HEMO MWF- [employer]  . GERD (gastroesophageal reflux disease)   . Hyperlipidemia   . Hypertension   . Myocardial infarction ([place])   . Neuropathy   . Peripheral vascular disease ([place])   . Pneumonia   . Restless legs   . S/P TAVR (transcatheter aortic valve replacement) 10/21/2018   26 mm  Edwards Sapien 3 Ultra transcatheter heart valve placed via percutaneous right transfemoral approach   . Severe aortic stenosis    severe    Past Surgical History:  Procedure Laterality Date  . ABDOMINAL AORTOGRAM W/LOWER EXTREMITY Bilateral 07/16/2018   Procedure: ABDOMINAL AORTOGRAM W/LOWER EXTREMITY;  Surgeon: Wellington Hampshire, MD;  Location: [place] [facility];  Service: Cardiovascular;  Laterality: Bilateral;  . ABDOMINAL AORTOGRAM W/LOWER EXTREMITY N/A 04/09/2019   Procedure: ABDOMINAL AORTOGRAM W/LOWER EXTREMITY - Right Lower;  Surgeon: Waynetta Sandy, MD;  Location: [place] [facility];  Service: Cardiovascular;  Laterality: N/A;  . AMPUTATION Left 08/19/2018   Procedure: AMPUTATION LEFT TOES SECOND AND THIRD;  Surgeon: Waynetta Sandy, MD;  Location: [place];  Service: Vascular;  Laterality: Left;  . AMPUTATION Right 05/07/2019   Procedure: AMPUTATION BELOW KNEE;  Surgeon: Serafina Mitchell, MD;  Location: [facility];  Service: Vascular;  Laterality: Right;  . AV FISTULA PLACEMENT Right 12/27/2015   Procedure: Right Arm ARTERIOVENOUS (AV) FISTULA CREATION;  Surgeon: Angelia Mould, MD;  Location: [place];  Service: Vascular;  Laterality: Right;  . CATARACT EXTRACTION W/ INTRAOCULAR LENS  IMPLANT, BILATERAL    . COLONOSCOPY    . ERCP W/ SPHICTEROTOMY    . FEMORAL-POPLITEAL BYPASS GRAFT Left 07/22/2018  Procedure: left FEMORAL TO BELOW KNEE POPLITEAL ARTERY BYPASS GREAFT using 75mm removable ring propaten gore graft;  Surgeon: Waynetta Sandy, MD;  Location: [place];  Service: Vascular;  Laterality: Left;  . FEMORAL-POPLITEAL BYPASS GRAFT Right 04/10/2019   Procedure: BYPASS GRAFT RIGHT FEMORAL-POPLITEAL ARTERY;  Surgeon: Rosetta Posner, MD;  Location: [place];  Service: Vascular;  Laterality: Right;  . I & D EXTREMITY Right 05/05/2019   Procedure: Revision of  RIGHT  FOOT Amputation;  Surgeon: Waynetta Sandy, MD;  Location: [place];  Service: Vascular;   Laterality: Right;  . INTRAOPERATIVE TRANSTHORACIC ECHOCARDIOGRAM N/A 10/21/2018   Procedure: Intraoperative Transthoracic Echocardiogram;  Surgeon: Sherren Mocha, MD;  Location: [place];  Service: Open Heart Surgery;  Laterality: N/A;  . IR GENERIC HISTORICAL  01/27/2016   IR FLUORO GUIDE CV LINE RIGHT 01/27/2016 Arne Cleveland, MD [facility]  . IR GENERIC HISTORICAL  01/27/2016   IR US GUIDE VASC ACCESS RIGHT 01/27/2016 Arne Cleveland, MD [facility]  . LAPAROSCOPIC CHOLECYSTECTOMY  03/2011  . LEFT HEART CATH AND CORONARY ANGIOGRAPHY N/A 11/25/2017   Procedure: LEFT HEART CATH AND CORONARY ANGIOGRAPHY;  Surgeon: Martinique, Peter M, MD;  Location: [place] [facility];  Service: Cardiovascular;  Laterality: N/A;  . LEFT HEART CATH AND CORONARY ANGIOGRAPHY N/A 07/24/2018   Procedure: LEFT HEART CATH AND CORONARY ANGIOGRAPHY;  Surgeon: Sherren Mocha, MD;  Location: [place] [facility];  Service: Cardiovascular;  Laterality: N/A;  . RENAL BIOPSY, PERCUTANEOUS Right 09/08/2014  . RIGHT/LEFT HEART CATH AND CORONARY ANGIOGRAPHY N/A 03/25/2018   Procedure: RIGHT/LEFT HEART CATH AND CORONARY ANGIOGRAPHY;  Surgeon: Martinique, Peter M, MD;  Location: [place] [facility];  Service: Cardiovascular;  Laterality: N/A;  . TEE WITHOUT CARDIOVERSION N/A 05/14/2019   Procedure: TRANSESOPHAGEAL ECHOCARDIOGRAM (TEE);  Surgeon: Fay Records, MD;  Location: [facility] ENDOSCOPY;  Service: Cardiovascular;  Laterality: N/A;  . TOE AMPUTATION Left 08/19/2018   2nd & 3rd toes  . TONSILLECTOMY  ~ 1960  . TRANSCATHETER AORTIC VALVE REPLACEMENT, TRANSFEMORAL N/A 10/21/2018   Procedure: TRANSCATHETER AORTIC VALVE REPLACEMENT, TRANSFEMORAL;  Surgeon: Sherren Mocha, MD;  Location: [place];  Service: Open Heart Surgery;  Laterality: N/A;  . TRANSMETATARSAL AMPUTATION Right 04/29/2019   Procedure: RIGHT TRANSMETATARSAL AMPUTATION;  Surgeon: Waynetta Sandy, MD;  Location: [place];  Service: Vascular;  Laterality: Right;  . ULTRASOUND  GUIDANCE FOR VASCULAR ACCESS  03/25/2018   Procedure: Ultrasound Guidance For Vascular Access;  Surgeon: Martinique, Peter M, MD;  Location: [place] [facility];  Service: Cardiovascular;;    There were no vitals filed for this visit.   Subjective Assessment - 11/17/19 1348    Subjective He has been wearing the prosthesis 6 hrs 2x on non-dialysis days & 1 x on dialysis days. He arises ~3:30am and does not donne prosthesis until ~8am. He is sitting only during that time.    Patient is accompained by: Family member   dtr, Alysia Penna   Pertinent History Rt TTA, left 2nd & 3rd toe amp 08/19/2018, ESRD, HTN, CHF, aortic stenosis, left fem-pop bypass, peripheral neuropathy, back spasms,    Patient Stated Goals use prosthesis to be active in community, working on car, day travels, walks    Currently in Pain? Yes    Pain Score 7     Pain Location Back    Pain Orientation Mid    Pain Descriptors / Indicators Aching;Spasm    Pain Type Chronic pain    Pain Onset More than a  month ago    Pain Frequency Constant    Aggravating Factors  extension postions    Pain Relieving Factors flexion postitions    Pain Onset More than a month ago                             [facility] Adult PT Treatment/Exercise - 11/17/19 1348      Transfers   Sit to Stand 5: Supervision;With upper extremity assist;With armrests;From chair/3-in-1;Other (comment)    Stand to Sit 5: Supervision;With upper extremity assist;With armrests;To chair/3-in-1;Other (comment)      Ambulation/Gait   Ambulation/Gait Yes    Ambulation/Gait Assistance 5: Supervision;4: Min assist    Ambulation/Gait Assistance Details verbal cues on upright posture, wt shift over prosthesis & step length    Ambulation Distance (Feet) 200 Feet    Assistive device Rolling walker;Prosthesis    Ambulation Surface Level;Indoor    Ramp 5: Supervision   RW & Prosthesis    Curb 5: Supervision   RW & prosthesis      Neuro Re-ed    Neuro Re-ed Details   standing with back on door frame & reaching over head for stretch to upright 2 reps per UE & 2 reps BUEs.       Exercises   Other Exercises  standing with minA & tactile cues for balance: green theraband reciprocal & BUE rows and forward punch 10 reps ea.       Knee/Hip Exercises: Machines for Strengthening   Cybex Leg Press Shuttle BLEs 81# 10 reps 2 sets with PT manual assist for left knee ext.        Prosthetics   Prosthetic Care Comments  reviewed adjusting ply socks. verbal cues on donning & adjusting with long pants    Current prosthetic wear tolerance (days/week)  daily    Current prosthetic wear tolerance (#hours/day)  6 hours 2x on non-dialysis days & 1x on dialysis days    Current prosthetic weight-bearing tolerance (hours/day)  Patient tolerated 10 min of standing with partial weight on prosthesis without pain    Edema pitting edema, used wrap prior to PT today as shrinker causing limb discomfort,  PT recommended to wrap higher to level of prosthesis    Residual limb condition  no wounds or skin issues. normal color, dry distally,     Education Provided Skin check;Residual limb care;Correct ply sock adjustment;Other (comment)   see prosthetic care comments   Person(s) Educated Patient    Education Method Explanation;Verbal cues    Education Method Verbalized understanding;Verbal cues required    Donning Prosthesis Supervision                    PT Short Term Goals - 11/17/19 1950      PT SHORT TERM GOAL #1   Title Patient verbalizes understanding of adjusting ply socks with limb volume changes including with dialysis.  (All STGs Target Date:11/20/2019)    Baseline Partially Met 11/17/2019    Time 4    Period Weeks    Status Partially Met    Target Date 11/20/19      PT SHORT TERM GOAL #2   Title Patient tolerates prosthesis wear >90% awake hours / day on non-dialysis days with no increased skin issues    Baseline NOT MET 11/17/2019   Pt tolerates wear 75% on  non-dialysis days with no skin issues    Time 4    Period Weeks  Status Not Met    Target Date 11/20/19      PT SHORT TERM GOAL #3   Title Berg Balance >36/56    Time 4    Period Weeks    Status On-going    Target Date 11/20/19      PT SHORT TERM GOAL #4   Title Patient ambulates 150' with cane & prosthesis with supervision.    Time 4    Period Weeks    Status On-going    Target Date 11/20/19      PT SHORT TERM GOAL #5   Title Patient negotiates ramps & curbs with cane with minimal assist and stairs with 1 rail/cane with supervision with prosthesis.    Time 4    Period Weeks    Status On-going    Target Date 11/20/19             PT Long Term Goals - 09/29/19 2214      PT LONG TERM GOAL #1   Title Patient verbalizes & demonstrates understanding of proper prosthetic care to enable safe utilization of prosthesis.  (All LTGs Target Date: 12/18/2019)    Time 12    Period Weeks    Status On-going    Target Date 12/18/19      PT LONG TERM GOAL #2   Title Patient tolerates prosthesis wear >90% of awake hours without skin issues & limb pain </= 2/10 to enable function throughout his day.    Time 12    Period Weeks    Status On-going    Target Date 12/18/19      PT LONG TERM GOAL #3   Title Berg Balance with prosthesis >/= 45/56 indicating lower fall risk.    Time 12    Period Weeks    Status On-going    Target Date 12/18/19      PT LONG TERM GOAL #4   Title Patient ambulates 500' with cane or less & prosthesis modified independent for community mobility.    Time 12    Period Weeks    Status On-going    Target Date 12/18/19      PT LONG TERM GOAL #5   Title Patient negotiates ramps, curbs & stairs with single rail with cane or less and prosthesis modified independent to enable community access.    Time 12    Period Weeks    Status On-going    Target Date 12/18/19      PT LONG TERM GOAL #6   Title Patient reports back & residual limb pain increases </= 2  increments on 0-10 scale with standing & gait activities.    Time 12    Period Weeks    Status On-going    Target Date 12/18/19                 Plan - 11/17/19 1348    Clinical Impression Statement Patient is making slow but steady progress towards prosthetic function.  He requires skilled cues for prosthetic care.    Personal Factors and Comorbidities Comorbidity 3+;Fitness;Time since onset of injury/illness/exacerbation;Past/Current Experience    Comorbidities Rt TTA, left 2nd & 3rd toe amp 08/19/2018, ESRD, HTN, CHF, aortic stenosis, left fem-pop bypass, peripheral neuropathy, back spasms    Examination-Activity Limitations Lift;Locomotion Level;Stairs;Stand;Transfers    Examination-Participation Restrictions Community Activity;Shop    Stability/Clinical Decision Making Evolving/Moderate complexity    Rehab Potential Good    PT Frequency 2x / week    PT Duration 12 weeks  PT Treatment/Interventions ADLs/Self Care Home Management;Canalith Repostioning;DME Instruction;Gait training;Stair training;Functional mobility training;Therapeutic activities;Therapeutic exercise;Balance training;Neuromuscular re-education;Patient/family education;Prosthetic Training;Manual techniques;Scar mobilization;Passive range of motion;Dry needling;Joint Manipulations;Vestibular    PT Next Visit Plan check remaining updated STGs, review prosthetic care paticularly cleaining , review Prosthetic gait instructing in ramps, curbs & stairs with RW, obstacle negotiation, higher level balance challenges    PT Home Exercise Plan .ampsink, see pt instructions section on 09/24/19 note    Consulted and Agree with Plan of Care Patient           Patient will benefit from skilled therapeutic intervention in order to improve the following deficits and impairments:  Abnormal gait, Decreased activity tolerance, Decreased balance, Decreased endurance, Decreased knowledge of use of DME, Decreased mobility, Decreased range  of motion, Decreased skin integrity, Decreased scar mobility, Decreased strength, Dizziness, Increased edema, Postural dysfunction, Prosthetic Dependency, Pain  Visit Diagnosis: Muscle weakness (generalized)  Other abnormalities of gait and mobility  Unsteadiness on feet  Abnormal posture  Stiffness of right knee, not elsewhere classified  Stiffness of left knee, not elsewhere classified     Problem List Patient Active Problem List   Diagnosis Date Noted  . Neuropathy 10/06/2019  . Gait abnormality 08/06/2019  . Back muscle spasm 07/09/2019  . Sleep disturbance   . Nausea without vomiting   . Orthostatic hypotension   . Acute on chronic anemia   . Chronic combined systolic and diastolic congestive heart failure ([place])   . ESRD on dialysis ([place])   . Right below-knee amputee ([place]) 05/18/2019  . Pressure injury of skin 05/14/2019  . Enterococcal bacteremia 05/13/2019  . Polymorphic ventricular tachycardia ([place]) 10/24/2018  . S/P TAVR (transcatheter aortic valve replacement) 10/21/2018  . Anxiety   . Coronary artery disease involving native coronary artery of native heart without angina pectoris   . Thrombocytopenia ([place])   . PAD (peripheral artery disease) ([place]) 07/22/2018  . Acute on chronic combined systolic and diastolic CHF (congestive heart failure) ([place]) 11/30/2017  . ESRD (end stage renal disease) on dialysis ([place])   . Iron deficiency anemia 11/22/2015  . AL amyloidosis ([place]) 09/25/2014  . Anemia of chronic disease 09/25/2014  . Essential hypertension, benign 11/06/2013    Jamey Reas, PT, DPT 11/17/2019, 7:55 PM  [facility] Physical Therapy [address] [place], [place], [address] Phone: [phone]   Fax:  [phone]  Name: Jose Garrett MRN: [id number] Date of Birth: Dec 15, 1950

## 2019-11-19 ENCOUNTER — Other Ambulatory Visit: Payer: Self-pay

## 2019-11-19 ENCOUNTER — Encounter: Payer: Self-pay | Admitting: Physical Therapy

## 2019-11-19 ENCOUNTER — Ambulatory Visit (INDEPENDENT_AMBULATORY_CARE_PROVIDER_SITE_OTHER): Payer: Medicare Other | Admitting: Physical Therapy

## 2019-11-19 DIAGNOSIS — M545 Low back pain: Secondary | ICD-10-CM

## 2019-11-19 DIAGNOSIS — R2681 Unsteadiness on feet: Secondary | ICD-10-CM | POA: Diagnosis not present

## 2019-11-19 DIAGNOSIS — R293 Abnormal posture: Secondary | ICD-10-CM | POA: Diagnosis not present

## 2019-11-19 DIAGNOSIS — M6281 Muscle weakness (generalized): Secondary | ICD-10-CM | POA: Diagnosis not present

## 2019-11-19 DIAGNOSIS — Z9181 History of falling: Secondary | ICD-10-CM

## 2019-11-19 DIAGNOSIS — R262 Difficulty in walking, not elsewhere classified: Secondary | ICD-10-CM

## 2019-11-19 DIAGNOSIS — R2689 Other abnormalities of gait and mobility: Secondary | ICD-10-CM | POA: Diagnosis not present

## 2019-11-19 DIAGNOSIS — M25661 Stiffness of right knee, not elsewhere classified: Secondary | ICD-10-CM

## 2019-11-19 DIAGNOSIS — M25662 Stiffness of left knee, not elsewhere classified: Secondary | ICD-10-CM

## 2019-11-19 DIAGNOSIS — G8929 Other chronic pain: Secondary | ICD-10-CM

## 2019-11-19 DIAGNOSIS — Z89511 Acquired absence of right leg below knee: Secondary | ICD-10-CM

## 2019-11-19 NOTE — Therapy (Signed)
[facility] Physical Therapy [address] [place], [place], [address] Phone: [phone]   Fax:  [phone]  Physical Therapy Treatment  Patient Details  Name: Jose Garrett MRN: [id number] Date of Birth: Apr 16, 1950 Referring Provider (PT): Curt Jews, MD   Encounter Date: 11/19/2019   PT End of Session - 11/19/19 1518    Visit Number 11    Number of Visits 25    Date for PT Re-Evaluation 12/21/19    Authorization Type [employer] Medicare    Authorization Time Period $30 co-pay    Progress Note Due on Visit 20    PT Start Time 1355    PT Stop Time 1425    PT Time Calculation (min) 30 min    Equipment Utilized During Treatment Gait belt    Activity Tolerance Patient tolerated treatment well    Behavior During Therapy [facility] for tasks assessed/performed           Past Medical History:  Diagnosis Date  . Amyloidosis ([place])   . Anemia of chronic disease   . Anxiety   . CAD (coronary artery disease)    a. cardiac cath on 11/25/17 with occlusion of the RCA which could have been his event but the RCA could not be engaged. The RCA filled distally from left to right collaterals. There were no severe stenoses in the left coronary system. Medical therapy recommended, started on plavix.  . Chronic combined systolic and diastolic CHF (congestive heart failure) ([place])    a. EF dropped to 35-40% with grade 2 DD by echo 11/2017.  [name] DJD (degenerative joint disease), lumbar   . ESRD (end stage renal disease) ([place])    ESRD- HEMO MWF- [employer]  . GERD (gastroesophageal reflux disease)   . Hyperlipidemia   . Hypertension   . Myocardial infarction ([place])   . Neuropathy   . Peripheral vascular disease ([place])   . Pneumonia   . Restless legs   . S/P TAVR (transcatheter aortic valve replacement) 10/21/2018   26 mm Edwards Sapien 3 Ultra transcatheter heart valve placed via percutaneous right transfemoral approach   . Severe aortic stenosis    severe    Past Surgical History:    Procedure Laterality Date  . ABDOMINAL AORTOGRAM W/LOWER EXTREMITY Bilateral 07/16/2018   Procedure: ABDOMINAL AORTOGRAM W/LOWER EXTREMITY;  Surgeon: Wellington Hampshire, MD;  Location: [place] [facility];  Service: Cardiovascular;  Laterality: Bilateral;  . ABDOMINAL AORTOGRAM W/LOWER EXTREMITY N/A 04/09/2019   Procedure: ABDOMINAL AORTOGRAM W/LOWER EXTREMITY - Right Lower;  Surgeon: Waynetta Sandy, MD;  Location: [place] [facility];  Service: Cardiovascular;  Laterality: N/A;  . AMPUTATION Left 08/19/2018   Procedure: AMPUTATION LEFT TOES SECOND AND THIRD;  Surgeon: Waynetta Sandy, MD;  Location: [place];  Service: Vascular;  Laterality: Left;  . AMPUTATION Right 05/07/2019   Procedure: AMPUTATION BELOW KNEE;  Surgeon: Serafina Mitchell, MD;  Location: [facility] OR;  Service: Vascular;  Laterality: Right;  . AV FISTULA PLACEMENT Right 12/27/2015   Procedure: Right Arm ARTERIOVENOUS (AV) FISTULA CREATION;  Surgeon: Angelia Mould, MD;  Location: [place];  Service: Vascular;  Laterality: Right;  . CATARACT EXTRACTION W/ INTRAOCULAR LENS  IMPLANT, BILATERAL    . COLONOSCOPY    . ERCP W/ SPHICTEROTOMY    . FEMORAL-POPLITEAL BYPASS GRAFT Left 07/22/2018   Procedure: left FEMORAL TO BELOW KNEE POPLITEAL ARTERY BYPASS GREAFT using 102mm removable ring propaten gore graft;  Surgeon: Waynetta Sandy, MD;  Location: [place];  Service: Vascular;  Laterality: Left;  . FEMORAL-POPLITEAL BYPASS GRAFT Right 04/10/2019   Procedure: BYPASS GRAFT RIGHT FEMORAL-POPLITEAL ARTERY;  Surgeon: Rosetta Posner, MD;  Location: [place];  Service: Vascular;  Laterality: Right;  . I & D EXTREMITY Right 05/05/2019   Procedure: Revision of  RIGHT  FOOT Amputation;  Surgeon: Waynetta Sandy, MD;  Location: [place];  Service: Vascular;  Laterality: Right;  . INTRAOPERATIVE TRANSTHORACIC ECHOCARDIOGRAM N/A 10/21/2018   Procedure: Intraoperative Transthoracic Echocardiogram;  Surgeon: Sherren Mocha, MD;   Location: [place];  Service: Open Heart Surgery;  Laterality: N/A;  . IR GENERIC HISTORICAL  01/27/2016   IR FLUORO GUIDE CV LINE RIGHT 01/27/2016 Arne Cleveland, MD [facility]INTER[facility]  . IR GENERIC HISTORICAL  01/27/2016   IR US GUIDE VASC ACCESS RIGHT 01/27/2016 Arne Cleveland, MD [facility]  . LAPAROSCOPIC CHOLECYSTECTOMY  03/2011  . LEFT HEART CATH AND CORONARY ANGIOGRAPHY N/A 11/25/2017   Procedure: LEFT HEART CATH AND CORONARY ANGIOGRAPHY;  Surgeon: Martinique, Peter M, MD;  Location: [place] [facility];  Service: Cardiovascular;  Laterality: N/A;  . LEFT HEART CATH AND CORONARY ANGIOGRAPHY N/A 07/24/2018   Procedure: LEFT HEART CATH AND CORONARY ANGIOGRAPHY;  Surgeon: Sherren Mocha, MD;  Location: [place] [facility];  Service: Cardiovascular;  Laterality: N/A;  . RENAL BIOPSY, PERCUTANEOUS Right 09/08/2014  . RIGHT/LEFT HEART CATH AND CORONARY ANGIOGRAPHY N/A 03/25/2018   Procedure: RIGHT/LEFT HEART CATH AND CORONARY ANGIOGRAPHY;  Surgeon: Martinique, Peter M, MD;  Location: [place] [facility];  Service: Cardiovascular;  Laterality: N/A;  . TEE WITHOUT CARDIOVERSION N/A 05/14/2019   Procedure: TRANSESOPHAGEAL ECHOCARDIOGRAM (TEE);  Surgeon: Fay Records, MD;  Location: [facility] ENDOSCOPY;  Service: Cardiovascular;  Laterality: N/A;  . TOE AMPUTATION Left 08/19/2018   2nd & 3rd toes  . TONSILLECTOMY  ~ 1960  . TRANSCATHETER AORTIC VALVE REPLACEMENT, TRANSFEMORAL N/A 10/21/2018   Procedure: TRANSCATHETER AORTIC VALVE REPLACEMENT, TRANSFEMORAL;  Surgeon: Sherren Mocha, MD;  Location: [place];  Service: Open Heart Surgery;  Laterality: N/A;  . TRANSMETATARSAL AMPUTATION Right 04/29/2019   Procedure: RIGHT TRANSMETATARSAL AMPUTATION;  Surgeon: Waynetta Sandy, MD;  Location: [place];  Service: Vascular;  Laterality: Right;  . ULTRASOUND GUIDANCE FOR VASCULAR ACCESS  03/25/2018   Procedure: Ultrasound Guidance For Vascular Access;  Surgeon: Martinique, Peter M, MD;  Location: [place] [facility];  Service:  Cardiovascular;;    There were no vitals filed for this visit.   Subjective Assessment - 11/19/19 1410    Subjective relays he saw MD for his back and he may end up having injections. He says he is limited today by 7/10 back pain.    Patient is accompained by: Family member   dtr, Alysia Penna   Pertinent History Rt TTA, left 2nd & 3rd toe amp 08/19/2018, ESRD, HTN, CHF, aortic stenosis, left fem-pop bypass, peripheral neuropathy, back spasms,    Patient Stated Goals use prosthesis to be active in community, working on car, day travels, walks    Pain Onset More than a month ago    Pain Onset More than a month ago                             [facility] Adult PT Treatment/Exercise - 11/19/19 0001      Transfers   Sit to Stand With upper extremity assist;With armrests;From chair/3-in-1;Other (comment);6: Modified independent (Device/Increase time)    Stand to Sit 6: Modified independent (Device/Increase time)  Ambulation/Gait   Ambulation/Gait Yes    Ambulation/Gait Assistance 5: Supervision;4: Min assist    Ambulation Distance (Feet) 200 Feet    Assistive device Rolling walker;Prosthesis      Neuro Re-ed    Neuro Re-ed Details  standing balance modified tandem stance      Exercises   Other Exercises  Nu step for endurance 6 min L6. seated hamstring stretch 30 sec X 3,      Knee/Hip Exercises: Machines for Strengthening   Cybex Leg Press Shuttle BLEs 81# 10 reps 1 sets with PT manual assist for left knee ext.  Then dropped down to 62 lbs 2X10      Knee/Hip Exercises: Seated   Sit to Sand 10 reps   with one UE support                   PT Short Term Goals - 11/17/19 1950      PT SHORT TERM GOAL #1   Title Patient verbalizes understanding of adjusting ply socks with limb volume changes including with dialysis.  (All STGs Target Date:11/20/2019)    Baseline Partially Met 11/17/2019    Time 4    Period Weeks    Status Partially Met    Target Date 11/20/19       PT SHORT TERM GOAL #2   Title Patient tolerates prosthesis wear >90% awake hours / day on non-dialysis days with no increased skin issues    Baseline NOT MET 11/17/2019   Pt tolerates wear 75% on non-dialysis days with no skin issues    Time 4    Period Weeks    Status Not Met    Target Date 11/20/19      PT SHORT TERM GOAL #3   Title Berg Balance >36/56    Time 4    Period Weeks    Status On-going    Target Date 11/20/19      PT SHORT TERM GOAL #4   Title Patient ambulates 150' with cane & prosthesis with supervision.    Time 4    Period Weeks    Status On-going    Target Date 11/20/19      PT SHORT TERM GOAL #5   Title Patient negotiates ramps & curbs with cane with minimal assist and stairs with 1 rail/cane with supervision with prosthesis.    Time 4    Period Weeks    Status On-going    Target Date 11/20/19             PT Long Term Goals - 09/29/19 2214      PT LONG TERM GOAL #1   Title Patient verbalizes & demonstrates understanding of proper prosthetic care to enable safe utilization of prosthesis.  (All LTGs Target Date: 12/18/2019)    Time 12    Period Weeks    Status On-going    Target Date 12/18/19      PT LONG TERM GOAL #2   Title Patient tolerates prosthesis wear >90% of awake hours without skin issues & limb pain </= 2/10 to enable function throughout his day.    Time 12    Period Weeks    Status On-going    Target Date 12/18/19      PT LONG TERM GOAL #3   Title Berg Balance with prosthesis >/= 45/56 indicating lower fall risk.    Time 12    Period Weeks    Status On-going    Target Date 12/18/19  PT LONG TERM GOAL #4   Title Patient ambulates 500' with cane or less & prosthesis modified independent for community mobility.    Time 12    Period Weeks    Status On-going    Target Date 12/18/19      PT LONG TERM GOAL #5   Title Patient negotiates ramps, curbs & stairs with single rail with cane or less and prosthesis modified  independent to enable community access.    Time 12    Period Weeks    Status On-going    Target Date 12/18/19      PT LONG TERM GOAL #6   Title Patient reports back & residual limb pain increases </= 2 increments on 0-10 scale with standing & gait activities.    Time 12    Period Weeks    Status On-going    Target Date 12/18/19                 Plan - 11/19/19 1519    Clinical Impression Statement Session somewhat limited due to time restraints as he arrives late but also limited by back pain and fatique today. Worked on leg strength, endurance, and gait today and PT will continue to progress as able toward his functional goals.    Personal Factors and Comorbidities Comorbidity 3+;Fitness;Time since onset of injury/illness/exacerbation;Past/Current Experience    Comorbidities Rt TTA, left 2nd & 3rd toe amp 08/19/2018, ESRD, HTN, CHF, aortic stenosis, left fem-pop bypass, peripheral neuropathy, back spasms    Examination-Activity Limitations Lift;Locomotion Level;Stairs;Stand;Transfers    Examination-Participation Restrictions Community Activity;Shop    Stability/Clinical Decision Making Evolving/Moderate complexity    Rehab Potential Good    PT Frequency 2x / week    PT Duration 12 weeks    PT Treatment/Interventions ADLs/Self Care Home Management;Canalith Repostioning;DME Instruction;Gait training;Stair training;Functional mobility training;Therapeutic activities;Therapeutic exercise;Balance training;Neuromuscular re-education;Patient/family education;Prosthetic Training;Manual techniques;Scar mobilization;Passive range of motion;Dry needling;Joint Manipulations;Vestibular    PT Next Visit Plan check remaining updated STGs, review prosthetic care paticularly cleaining , review Prosthetic gait instructing in ramps, curbs & stairs with RW, obstacle negotiation, higher level balance challenges    PT Home Exercise Plan .ampsink, see pt instructions section on 09/24/19 note    Consulted  and Agree with Plan of Care Patient           Patient will benefit from skilled therapeutic intervention in order to improve the following deficits and impairments:  Abnormal gait, Decreased activity tolerance, Decreased balance, Decreased endurance, Decreased knowledge of use of DME, Decreased mobility, Decreased range of motion, Decreased skin integrity, Decreased scar mobility, Decreased strength, Dizziness, Increased edema, Postural dysfunction, Prosthetic Dependency, Pain  Visit Diagnosis: Muscle weakness (generalized)  Other abnormalities of gait and mobility  Unsteadiness on feet  Abnormal posture  Stiffness of right knee, not elsewhere classified  Stiffness of left knee, not elsewhere classified  Right below-knee amputee ([place])  History of falling  Chronic bilateral low back pain without sciatica  Difficulty in walking, not elsewhere classified     Problem List Patient Active Problem List   Diagnosis Date Noted  . Neuropathy 10/06/2019  . Gait abnormality 08/06/2019  . Back muscle spasm 07/09/2019  . Sleep disturbance   . Nausea without vomiting   . Orthostatic hypotension   . Acute on chronic anemia   . Chronic combined systolic and diastolic congestive heart failure ([place])   . ESRD on dialysis ([place])   . Right below-knee amputee ([place]) 05/18/2019  . Pressure injury of skin 05/14/2019  .  Enterococcal bacteremia 05/13/2019  . Polymorphic ventricular tachycardia ([place]) 10/24/2018  . S/P TAVR (transcatheter aortic valve replacement) 10/21/2018  . Anxiety   . Coronary artery disease involving native coronary artery of native heart without angina pectoris   . Thrombocytopenia ([place])   . PAD (peripheral artery disease) ([place]) 07/22/2018  . Acute on chronic combined systolic and diastolic CHF (congestive heart failure) ([place]) 11/30/2017  . ESRD (end stage renal disease) on dialysis ([place])   . Iron deficiency anemia 11/22/2015  . AL amyloidosis ([place]) 09/25/2014  .  Anemia of chronic disease 09/25/2014  . Essential hypertension, benign 11/06/2013    Silvestre Mesi 11/19/2019, 3:21 PM  [facility] Physical Therapy [address] [place], [place], [address] Phone: [phone]   Fax:  [phone]  Name: JABE JEANBAPTISTE MRN: [id number] Date of Birth: 1950/05/25

## 2019-11-20 ENCOUNTER — Ambulatory Visit: Payer: Medicare Other | Admitting: Cardiovascular Disease

## 2019-11-24 ENCOUNTER — Ambulatory Visit (INDEPENDENT_AMBULATORY_CARE_PROVIDER_SITE_OTHER): Payer: Medicare Other | Admitting: Physical Therapy

## 2019-11-24 ENCOUNTER — Other Ambulatory Visit: Payer: Self-pay

## 2019-11-24 ENCOUNTER — Encounter: Payer: Self-pay | Admitting: Physical Therapy

## 2019-11-24 DIAGNOSIS — R293 Abnormal posture: Secondary | ICD-10-CM

## 2019-11-24 DIAGNOSIS — R2681 Unsteadiness on feet: Secondary | ICD-10-CM

## 2019-11-24 DIAGNOSIS — R2689 Other abnormalities of gait and mobility: Secondary | ICD-10-CM

## 2019-11-24 DIAGNOSIS — M6281 Muscle weakness (generalized): Secondary | ICD-10-CM | POA: Diagnosis not present

## 2019-11-24 DIAGNOSIS — M25662 Stiffness of left knee, not elsewhere classified: Secondary | ICD-10-CM

## 2019-11-24 DIAGNOSIS — M25661 Stiffness of right knee, not elsewhere classified: Secondary | ICD-10-CM

## 2019-11-24 NOTE — Therapy (Signed)
[facility] [address] [place], [place], [address] Phone: [phone]   Fax:  [phone]  Physical Therapy Treatment  Patient Details  Name: Jose Garrett MRN: [id number] Date of Birth: [birth year] Referring Provider (PT): Curt Jews, MD   Encounter Date: 11/24/2019   PT End of Session - 11/24/19 1345    Visit Number 12    Number of Visits 25    Date for PT Re-Evaluation 12/21/19    Authorization Type [employer] Medicare    Authorization Time Period $30 co-pay    Progress Note Due on Visit 20    PT Start Time 1345    PT Stop Time 1430    PT Time Calculation (min) 45 min    Equipment Utilized During Treatment Gait belt    Activity Tolerance Patient tolerated treatment well    Behavior During Therapy [facility] for tasks assessed/performed           Past Medical History:  Diagnosis Date  . Amyloidosis ([place])   . Anemia of chronic disease   . Anxiety   . CAD (coronary artery disease)    a. cardiac cath on 11/25/17 with occlusion of the RCA which could have been his event but the RCA could not be engaged. The RCA filled distally from left to right collaterals. There were no severe stenoses in the left coronary system. Medical therapy recommended, started on plavix.  . Chronic combined systolic and diastolic CHF (congestive heart failure) ([place])    a. EF dropped to 35-40% with grade 2 DD by echo 11/2017.  [name] DJD (degenerative joint disease), lumbar   . ESRD (end stage renal disease) ([place])    ESRD- HEMO MWF- [facility]  . GERD (gastroesophageal reflux disease)   . Hyperlipidemia   . Hypertension   . Myocardial infarction ([place])   . Neuropathy   . Peripheral vascular disease ([place])   . Pneumonia   . Restless legs   . S/P TAVR (transcatheter aortic valve replacement) 10/21/2018   26 mm Edwards Sapien 3 Ultra transcatheter heart valve placed via percutaneous right transfemoral approach   . Severe aortic stenosis    severe    Past Surgical History:    Procedure Laterality Date  . ABDOMINAL AORTOGRAM W/LOWER EXTREMITY Bilateral 07/16/2018   Procedure: ABDOMINAL AORTOGRAM W/LOWER EXTREMITY;  Surgeon: Wellington Hampshire, MD;  Location: [place] [facility];  Service: Cardiovascular;  Laterality: Bilateral;  . ABDOMINAL AORTOGRAM W/LOWER EXTREMITY N/A 04/09/2019   Procedure: ABDOMINAL AORTOGRAM W/LOWER EXTREMITY - Right Lower;  Surgeon: Waynetta Sandy, MD;  Location: [place] [facility];  Service: Cardiovascular;  Laterality: N/A;  . AMPUTATION Left 08/19/2018   Procedure: AMPUTATION LEFT TOES SECOND AND THIRD;  Surgeon: Waynetta Sandy, MD;  Location: [place];  Service: Vascular;  Laterality: Left;  . AMPUTATION Right 05/07/2019   Procedure: AMPUTATION BELOW KNEE;  Surgeon: Serafina Mitchell, MD;  Location: [facility] OR;  Service: Vascular;  Laterality: Right;  . AV FISTULA PLACEMENT Right 12/27/2015   Procedure: Right Arm ARTERIOVENOUS (AV) FISTULA CREATION;  Surgeon: Angelia Mould, MD;  Location: [place];  Service: Vascular;  Laterality: Right;  . CATARACT EXTRACTION W/ INTRAOCULAR LENS  IMPLANT, BILATERAL    . COLONOSCOPY    . ERCP W/ SPHICTEROTOMY    . FEMORAL-POPLITEAL BYPASS GRAFT Left 07/22/2018   Procedure: left FEMORAL TO BELOW KNEE POPLITEAL ARTERY BYPASS GREAFT using 76mm removable ring propaten gore graft;  Surgeon: Waynetta Sandy, MD;  Location: [place];  Service: Vascular;  Laterality: Left;  . FEMORAL-POPLITEAL BYPASS GRAFT Right 04/10/2019   Procedure: BYPASS GRAFT RIGHT FEMORAL-POPLITEAL ARTERY;  Surgeon: Rosetta Posner, MD;  Location: [place];  Service: Vascular;  Laterality: Right;  . I & D EXTREMITY Right 05/05/2019   Procedure: Revision of  RIGHT  FOOT Amputation;  Surgeon: Waynetta Sandy, MD;  Location: [place];  Service: Vascular;  Laterality: Right;  . INTRAOPERATIVE TRANSTHORACIC ECHOCARDIOGRAM N/A 10/21/2018   Procedure: Intraoperative Transthoracic Echocardiogram;  Surgeon: Sherren Mocha, MD;   Location: [place];  Service: Open Heart Surgery;  Laterality: N/A;  . IR GENERIC HISTORICAL  01/27/2016   IR FLUORO GUIDE CV LINE RIGHT 01/27/2016 Arne Cleveland, MD [facility]INTER[facility]  . IR GENERIC HISTORICAL  01/27/2016   IR US GUIDE VASC ACCESS RIGHT 01/27/2016 Arne Cleveland, MD [facility]  . LAPAROSCOPIC CHOLECYSTECTOMY  03/2011  . LEFT HEART CATH AND CORONARY ANGIOGRAPHY N/A 11/25/2017   Procedure: LEFT HEART CATH AND CORONARY ANGIOGRAPHY;  Surgeon: Martinique, Peter M, MD;  Location: [place] [facility];  Service: Cardiovascular;  Laterality: N/A;  . LEFT HEART CATH AND CORONARY ANGIOGRAPHY N/A 07/24/2018   Procedure: LEFT HEART CATH AND CORONARY ANGIOGRAPHY;  Surgeon: Sherren Mocha, MD;  Location: [place] [facility];  Service: Cardiovascular;  Laterality: N/A;  . RENAL BIOPSY, PERCUTANEOUS Right 09/08/2014  . RIGHT/LEFT HEART CATH AND CORONARY ANGIOGRAPHY N/A 03/25/2018   Procedure: RIGHT/LEFT HEART CATH AND CORONARY ANGIOGRAPHY;  Surgeon: Martinique, Peter M, MD;  Location: [place] [facility];  Service: Cardiovascular;  Laterality: N/A;  . TEE WITHOUT CARDIOVERSION N/A 05/14/2019   Procedure: TRANSESOPHAGEAL ECHOCARDIOGRAM (TEE);  Surgeon: Fay Records, MD;  Location: [facility] ENDOSCOPY;  Service: Cardiovascular;  Laterality: N/A;  . TOE AMPUTATION Left 08/19/2018   2nd & 3rd toes  . TONSILLECTOMY  ~ 1960  . TRANSCATHETER AORTIC VALVE REPLACEMENT, TRANSFEMORAL N/A 10/21/2018   Procedure: TRANSCATHETER AORTIC VALVE REPLACEMENT, TRANSFEMORAL;  Surgeon: Sherren Mocha, MD;  Location: [place];  Service: Open Heart Surgery;  Laterality: N/A;  . TRANSMETATARSAL AMPUTATION Right 04/29/2019   Procedure: RIGHT TRANSMETATARSAL AMPUTATION;  Surgeon: Waynetta Sandy, MD;  Location: [place];  Service: Vascular;  Laterality: Right;  . ULTRASOUND GUIDANCE FOR VASCULAR ACCESS  03/25/2018   Procedure: Ultrasound Guidance For Vascular Access;  Surgeon: Martinique, Peter M, MD;  Location: [place] [facility];  Service:  Cardiovascular;;    There were no vitals filed for this visit.   Subjective Assessment - 11/24/19 1345    Subjective He is wearing prosthesis most of awake hours without issues.  His chronic back issues continues to limit activities.  He is fatigued with dialysis also.    Patient is accompained by: Family member   dtr, Alysia Penna   Pertinent History Rt TTA, left 2nd & 3rd toe amp 08/19/2018, ESRD, HTN, CHF, aortic stenosis, left fem-pop bypass, peripheral neuropathy, back spasms,    Patient Stated Goals use prosthesis to be active in community, working on car, day travels, walks    Currently in Pain? Yes    Pain Score 8     Pain Location Back    Pain Orientation Lower;Mid    Pain Descriptors / Indicators Spasm;Sharp;Squeezing    Pain Type Chronic pain    Pain Onset More than a month ago    Pain Frequency Constant    Aggravating Factors  standing & gait    Pain Relieving Factors medications, stretchs & heat    Pain Onset More than a month ago  [place] Adult PT Treatment/Exercise - 11/24/19 1345      Transfers   Sit to Stand 5: Supervision;With upper extremity assist;With armrests;From chair/3-in-1;Other (comment)   RW or SBQC for stabilization   Stand to Sit 5: Supervision;With armrests;To chair/3-in-1;With upper extremity assist;Other (comment)   RW or SBQC for stability     Ambulation/Gait   Ambulation/Gait Yes    Ambulation/Gait Assistance 5: Supervision;4: Min assist;3: Mod assist   RW supervision & SBQC modA to minA   Ambulation/Gait Assistance Details tactile & verbal cues on sequence, upright posture and wt shift.  Intially modA with HHA then progressed to minA.     Ambulation Distance (Feet) 200 Feet   200' RW & 49' with SBQC (60' with HHA)   Assistive device Rolling walker;Prosthesis;Small based quad cane;1 person hand held assist    Ambulation Surface Level;Indoor      High Level Balance   High Level Balance Activities Side  stepping;Backward walking;Marching forwards;Other (comment)   walking with eyes closed   High Level Balance Comments in //bars with light BUE support with tactile cues on technique.       Self-Care   Self-Care ADL's      Therapeutic Activites    Therapeutic Activities Lifting;Other Therapeutic Activities    Lifting PT demo & verbal cues on picking items from floor with TTA prosthesis. Pt return demo 2 reps with UE support on //bars & 2 reps no UE support with contact assist.      Other Therapeutic Activities PT demo & verbal cues on technique to minimize strain to low back.       Neuro Re-ed    Neuro Re-ed Details  --      Exercises   Other Exercises  --      Lumbar Exercises: Stretches   Single Knee to Chest Stretch Right;Left;2 reps;20 seconds    Single Knee to Chest Stretch Limitations tactile & verbal cues    Double Knee to Chest Stretch 2 reps;20 seconds    Double Knee to Chest Stretch Limitations tactile & verbal cues on technique    Lower Trunk Rotation 2 reps;20 seconds    Lower Trunk Rotation Limitations tactile & verbal cues on technique      Knee/Hip Exercises: Machines for Strengthening   Cybex Leg Press --      Knee/Hip Exercises: Seated   Sit to Sand 10 reps;without UE support   from 24" bar stool     Prosthetics   Current prosthetic wear tolerance (days/week)  daily    Current prosthetic wear tolerance (#hours/day)  reports most of awake hours. Sleeps after dialysis so decreased time but % is most of awake hours.     Residual limb condition  no wounds or skin issues. normal color, dry distally,     Education Provided Correct ply sock adjustment;Proper Donning;Proper wear schedule/adjustment    Person(s) Educated Patient    Education Method Explanation;Demonstration;Verbal cues    Education Method Verbalized understanding;Verbal cues required;Needs further instruction    Donning Prosthesis Supervision                    PT Short Term Goals - 11/17/19  1950      PT SHORT TERM GOAL #1   Title Patient verbalizes understanding of adjusting ply socks with limb volume changes including with dialysis.  (All STGs Target Date:11/20/2019)    Baseline Partially Met 11/17/2019    Time 4    Period Weeks    Status Partially  Met    Target Date 11/20/19      PT SHORT TERM GOAL #2   Title Patient tolerates prosthesis wear >90% awake hours / day on non-dialysis days with no increased skin issues    Baseline NOT MET 11/17/2019   Pt tolerates wear 75% on non-dialysis days with no skin issues    Time 4    Period Weeks    Status Not Met    Target Date 11/20/19      PT SHORT TERM GOAL #3   Title Berg Balance >36/56    Time 4    Period Weeks    Status On-going    Target Date 11/20/19      PT SHORT TERM GOAL #4   Title Patient ambulates 150' with cane & prosthesis with supervision.    Time 4    Period Weeks    Status On-going    Target Date 11/20/19      PT SHORT TERM GOAL #5   Title Patient negotiates ramps & curbs with cane with minimal assist and stairs with 1 rail/cane with supervision with prosthesis.    Time 4    Period Weeks    Status On-going    Target Date 11/20/19             PT Long Term Goals - 09/29/19 2214      PT LONG TERM GOAL #1   Title Patient verbalizes & demonstrates understanding of proper prosthetic care to enable safe utilization of prosthesis.  (All LTGs Target Date: 12/18/2019)    Time 12    Period Weeks    Status On-going    Target Date 12/18/19      PT LONG TERM GOAL #2   Title Patient tolerates prosthesis wear >90% of awake hours without skin issues & limb pain </= 2/10 to enable function throughout his day.    Time 12    Period Weeks    Status On-going    Target Date 12/18/19      PT LONG TERM GOAL #3   Title Berg Balance with prosthesis >/= 45/56 indicating lower fall risk.    Time 12    Period Weeks    Status On-going    Target Date 12/18/19      PT LONG TERM GOAL #4   Title Patient ambulates  500' with cane or less & prosthesis modified independent for community mobility.    Time 12    Period Weeks    Status On-going    Target Date 12/18/19      PT LONG TERM GOAL #5   Title Patient negotiates ramps, curbs & stairs with single rail with cane or less and prosthesis modified independent to enable community access.    Time 12    Period Weeks    Status On-going    Target Date 12/18/19      PT LONG TERM GOAL #6   Title Patient reports back & residual limb pain increases </= 2 increments on 0-10 scale with standing & gait activities.    Time 12    Period Weeks    Status On-going    Target Date 12/18/19                 Plan - 11/24/19 1345    Clinical Impression Statement PT instructed in back stretches & bed mobility to limit strain to back. He verbalizes understanding.  PT session also worked on Insurance underwriter.  PT progressed prosthetic gait to [facility]  with decreasing assistance.    Personal Factors and Comorbidities Comorbidity 3+;Fitness;Time since onset of injury/illness/exacerbation;Past/Current Experience    Comorbidities Rt TTA, left 2nd & 3rd toe amp 08/19/2018, ESRD, HTN, CHF, aortic stenosis, left fem-pop bypass, peripheral neuropathy, back spasms    Examination-Activity Limitations Lift;Locomotion Level;Stairs;Stand;Transfers    Examination-Participation Restrictions Community Activity;Shop    Stability/Clinical Decision Making Evolving/Moderate complexity    Rehab Potential Good    PT Frequency 2x / week    PT Duration 12 weeks    PT Treatment/Interventions ADLs/Self Care Home Management;Canalith Repostioning;DME Instruction;Gait training;Stair training;Functional mobility training;Therapeutic activities;Therapeutic exercise;Balance training;Neuromuscular re-education;Patient/family education;Prosthetic Training;Manual techniques;Scar mobilization;Passive range of motion;Dry needling;Joint Manipulations;Vestibular    PT Next Visit Plan work towards [place],   progress balance & gait with TTA prosthesis    PT Home Exercise Plan .ampsink, see pt instructions section on 09/24/19 note    Consulted and Agree with Plan of Care Patient           Patient will benefit from skilled therapeutic intervention in order to improve the following deficits and impairments:  Abnormal gait, Decreased activity tolerance, Decreased balance, Decreased endurance, Decreased knowledge of use of DME, Decreased mobility, Decreased range of motion, Decreased skin integrity, Decreased scar mobility, Decreased strength, Dizziness, Increased edema, Postural dysfunction, Prosthetic Dependency, Pain  Visit Diagnosis: Muscle weakness (generalized)  Other abnormalities of gait and mobility  Unsteadiness on feet  Abnormal posture  Stiffness of right knee, not elsewhere classified  Stiffness of left knee, not elsewhere classified     Problem List Patient Active Problem List   Diagnosis Date Noted  . Neuropathy 10/06/2019  . Gait abnormality 08/06/2019  . Back muscle spasm 07/09/2019  . Sleep disturbance   . Nausea without vomiting   . Orthostatic hypotension   . Acute on chronic anemia   . Chronic combined systolic and diastolic congestive heart failure ([place])   . ESRD on dialysis ([place])   . Right below-knee amputee ([place]) 05/18/2019  . Pressure injury of skin 05/14/2019  . Enterococcal bacteremia 05/13/2019  . Polymorphic ventricular tachycardia ([place]) 10/24/2018  . S/P TAVR (transcatheter aortic valve replacement) 10/21/2018  . Anxiety   . Coronary artery disease involving native coronary artery of native heart without angina pectoris   . Thrombocytopenia ([place])   . PAD (peripheral artery disease) ([place]) 07/22/2018  . Acute on chronic combined systolic and diastolic CHF (congestive heart failure) ([place]) 11/30/2017  . ESRD (end stage renal disease) on dialysis ([place])   . Iron deficiency anemia 11/22/2015  . AL amyloidosis ([place]) 09/25/2014  . Anemia of chronic  disease 09/25/2014  . Essential hypertension, benign 11/06/2013    Jamey Reas, PT, DPT 11/24/2019, 10:47 PM  [facility] [address] [place], [place], [address] Phone: [phone]   Fax:  [phone]  Name: Jose Garrett MRN: [id number] Date of Birth: 12/20/1950

## 2019-11-26 ENCOUNTER — Ambulatory Visit (INDEPENDENT_AMBULATORY_CARE_PROVIDER_SITE_OTHER): Payer: Medicare Other | Admitting: Physical Therapy

## 2019-11-26 ENCOUNTER — Other Ambulatory Visit: Payer: Self-pay

## 2019-11-26 DIAGNOSIS — Z89511 Acquired absence of right leg below knee: Secondary | ICD-10-CM

## 2019-11-26 DIAGNOSIS — M6281 Muscle weakness (generalized): Secondary | ICD-10-CM

## 2019-11-26 DIAGNOSIS — R2681 Unsteadiness on feet: Secondary | ICD-10-CM | POA: Diagnosis not present

## 2019-11-26 DIAGNOSIS — G8929 Other chronic pain: Secondary | ICD-10-CM

## 2019-11-26 DIAGNOSIS — M545 Low back pain, unspecified: Secondary | ICD-10-CM

## 2019-11-26 DIAGNOSIS — R2689 Other abnormalities of gait and mobility: Secondary | ICD-10-CM | POA: Diagnosis not present

## 2019-11-26 DIAGNOSIS — R293 Abnormal posture: Secondary | ICD-10-CM | POA: Diagnosis not present

## 2019-11-26 DIAGNOSIS — M25661 Stiffness of right knee, not elsewhere classified: Secondary | ICD-10-CM

## 2019-11-26 DIAGNOSIS — M25662 Stiffness of left knee, not elsewhere classified: Secondary | ICD-10-CM

## 2019-11-26 DIAGNOSIS — R262 Difficulty in walking, not elsewhere classified: Secondary | ICD-10-CM

## 2019-11-26 NOTE — Therapy (Signed)
[facility] Physical Therapy [address] [place], [place], [address] Phone: [phone]   Fax:  [phone]  Physical Therapy Treatment  Patient Details  Name: Jose Garrett MRN: [id number] Date of Birth: [birth year] Referring Provider (PT): Gretta Began, MD   Encounter Date: 11/26/2019   PT End of Session - 11/26/19 1432    Visit Number 13    Number of Visits 25    Date for PT Re-Evaluation 12/21/19    Authorization Type [employer] Medicare    Authorization Time Period $30 co-pay    Authorization - Number of Visits 11    Progress Note Due on Visit 20    PT Start Time 1347    PT Stop Time 1425    PT Time Calculation (min) 38 min    Equipment Utilized During Treatment Gait belt    Activity Tolerance Patient tolerated treatment well    Behavior During Therapy WFL for tasks assessed/performed           Past Medical History:  Diagnosis Date  . Amyloidosis (HCC)   . Anemia of chronic disease   . Anxiety   . CAD (coronary artery disease)    a. cardiac cath on 11/25/17 with occlusion of the RCA which could have been his event but the RCA could not be engaged. The RCA filled distally from left to right collaterals. There were no severe stenoses in the left coronary system. Medical therapy recommended, started on plavix.  . Chronic combined systolic and diastolic CHF (congestive heart failure) (HCC)    a. EF dropped to 35-40% with grade 2 DD by echo 11/2017.  [name] DJD (degenerative joint disease), lumbar   . ESRD (end stage renal disease) (HCC)    ESRD- HEMO MWF- [facility]  . GERD (gastroesophageal reflux disease)   . Hyperlipidemia   . Hypertension   . Myocardial infarction (HCC)   . Neuropathy   . Peripheral vascular disease (HCC)   . Pneumonia   . Restless legs   . S/P TAVR (transcatheter aortic valve replacement) 10/21/2018   26 mm Edwards Sapien 3 Ultra transcatheter heart valve placed via percutaneous right transfemoral approach   . Severe aortic stenosis      severe    Past Surgical History:  Procedure Laterality Date  . ABDOMINAL AORTOGRAM W/LOWER EXTREMITY Bilateral 07/16/2018   Procedure: ABDOMINAL AORTOGRAM W/LOWER EXTREMITY;  Surgeon: Iran Ouch, MD;  Location: [facility] INVASIVE CV LAB;  Service: Cardiovascular;  Laterality: Bilateral;  . ABDOMINAL AORTOGRAM W/LOWER EXTREMITY N/A 04/09/2019   Procedure: ABDOMINAL AORTOGRAM W/LOWER EXTREMITY - Right Lower;  Surgeon: Maeola Harman, MD;  Location: [facility] INVASIVE CV LAB;  Service: Cardiovascular;  Laterality: N/A;  . AMPUTATION Left 08/19/2018   Procedure: AMPUTATION LEFT TOES SECOND AND THIRD;  Surgeon: Maeola Harman, MD;  Location: [facility] OR;  Service: Vascular;  Laterality: Left;  . AMPUTATION Right 05/07/2019   Procedure: AMPUTATION BELOW KNEE;  Surgeon: Nada Libman, MD;  Location: [facility] OR;  Service: Vascular;  Laterality: Right;  . AV FISTULA PLACEMENT Right 12/27/2015   Procedure: Right Arm ARTERIOVENOUS (AV) FISTULA CREATION;  Surgeon: Chuck Hint, MD;  Location: [facility] OR;  Service: Vascular;  Laterality: Right;  . CATARACT EXTRACTION W/ INTRAOCULAR LENS  IMPLANT, BILATERAL    . COLONOSCOPY    . ERCP W/ SPHICTEROTOMY    . FEMORAL-POPLITEAL BYPASS GRAFT Left 07/22/2018   Procedure: left FEMORAL TO BELOW KNEE POPLITEAL ARTERY BYPASS GREAFT using 16mm removable ring propaten gore graft;  Surgeon:  Waynetta Sandy, MD;  Location: [place];  Service: Vascular;  Laterality: Left;  . FEMORAL-POPLITEAL BYPASS GRAFT Right 04/10/2019   Procedure: BYPASS GRAFT RIGHT FEMORAL-POPLITEAL ARTERY;  Surgeon: Rosetta Posner, MD;  Location: [place];  Service: Vascular;  Laterality: Right;  . I & D EXTREMITY Right 05/05/2019   Procedure: Revision of  RIGHT  FOOT Amputation;  Surgeon: Waynetta Sandy, MD;  Location: [place];  Service: Vascular;  Laterality: Right;  . INTRAOPERATIVE TRANSTHORACIC ECHOCARDIOGRAM N/A 10/21/2018   Procedure: Intraoperative Transthoracic  Echocardiogram;  Surgeon: Sherren Mocha, MD;  Location: [place];  Service: Open Heart Surgery;  Laterality: N/A;  . IR GENERIC HISTORICAL  01/27/2016   IR FLUORO GUIDE CV LINE RIGHT 01/27/2016 Arne Cleveland, MD [facility]-INTERV RAD  . IR GENERIC HISTORICAL  01/27/2016   IR US GUIDE VASC ACCESS RIGHT 01/27/2016 Arne Cleveland, MD [facility]INTER[facility]  . LAPAROSCOPIC CHOLECYSTECTOMY  03/2011  . LEFT HEART CATH AND CORONARY ANGIOGRAPHY N/A 11/25/2017   Procedure: LEFT HEART CATH AND CORONARY ANGIOGRAPHY;  Surgeon: Martinique, Peter M, MD;  Location: [place] CV LAB;  Service: Cardiovascular;  Laterality: N/A;  . LEFT HEART CATH AND CORONARY ANGIOGRAPHY N/A 07/24/2018   Procedure: LEFT HEART CATH AND CORONARY ANGIOGRAPHY;  Surgeon: Sherren Mocha, MD;  Location: [place] CV LAB;  Service: Cardiovascular;  Laterality: N/A;  . RENAL BIOPSY, PERCUTANEOUS Right 09/08/2014  . RIGHT/LEFT HEART CATH AND CORONARY ANGIOGRAPHY N/A 03/25/2018   Procedure: RIGHT/LEFT HEART CATH AND CORONARY ANGIOGRAPHY;  Surgeon: Martinique, Peter M, MD;  Location: [place] CV LAB;  Service: Cardiovascular;  Laterality: N/A;  . TEE WITHOUT CARDIOVERSION N/A 05/14/2019   Procedure: TRANSESOPHAGEAL ECHOCARDIOGRAM (TEE);  Surgeon: Fay Records, MD;  Location: [facility] ENDOSCOPY;  Service: Cardiovascular;  Laterality: N/A;  . TOE AMPUTATION Left 08/19/2018   2nd & 3rd toes  . TONSILLECTOMY  ~ 1960  . TRANSCATHETER AORTIC VALVE REPLACEMENT, TRANSFEMORAL N/A 10/21/2018   Procedure: TRANSCATHETER AORTIC VALVE REPLACEMENT, TRANSFEMORAL;  Surgeon: Sherren Mocha, MD;  Location: [place];  Service: Open Heart Surgery;  Laterality: N/A;  . TRANSMETATARSAL AMPUTATION Right 04/29/2019   Procedure: RIGHT TRANSMETATARSAL AMPUTATION;  Surgeon: Waynetta Sandy, MD;  Location: [place];  Service: Vascular;  Laterality: Right;  . ULTRASOUND GUIDANCE FOR VASCULAR ACCESS  03/25/2018   Procedure: Ultrasound Guidance For Vascular Access;  Surgeon: Martinique, Peter M, MD;   Location: [place] CV LAB;  Service: Cardiovascular;;    There were no vitals filed for this visit.   Subjective Assessment - 11/26/19 1413    Subjective relays some Rt medial knee pain today 6/10    Patient is accompained by: Family member   dtr, Alysia Penna   Pertinent History Rt TTA, left 2nd & 3rd toe amp 08/19/2018, ESRD, HTN, CHF, aortic stenosis, left fem-pop bypass, peripheral neuropathy, back spasms,    Patient Stated Goals use prosthesis to be active in community, working on car, day travels, walks    Pain Onset More than a month ago    Pain Onset More than a month ago            [facility] Adult PT Treatment/Exercise - 11/26/19 0001      Transfers   Sit to Stand 5: Supervision;With upper extremity assist;With armrests;From chair/3-in-1;Other (comment)    Stand to Sit 5: Supervision;With armrests;To chair/3-in-1;With upper extremity assist;Other (comment)      Ambulation/Gait   Ambulation/Gait Yes    Ambulation/Gait Assistance 5: Supervision;4: Min guard    Ambulation Distance (  Feet) 75 Feet   x2   Assistive device Straight cane      High Level Balance   High Level Balance Comments in //bars with light UE support lateral walking up/down  X3 reps      Exercises   Other Exercises  seated with back off off back rest for rows and chest press green band 3X15 ea. Seated LAQ 5 lbs 3X10 bilat      Prosthetics   Prosthetic Care Comments  reviewed adjusting and need to bring additional socksas he needed to add but he did not have any with him    Residual limb condition  no wounds or skin issues. normal color, dry distally,                     PT Short Term Goals - 11/17/19 1950      PT SHORT TERM GOAL #1   Title Patient verbalizes understanding of adjusting ply socks with limb volume changes including with dialysis.  (All STGs Target Date:11/20/2019)    Baseline Partially Met 11/17/2019    Time 4    Period Weeks    Status Partially Met    Target Date 11/20/19       PT SHORT TERM GOAL #2   Title Patient tolerates prosthesis wear >90% awake hours / day on non-dialysis days with no increased skin issues    Baseline NOT MET 11/17/2019   Pt tolerates wear 75% on non-dialysis days with no skin issues    Time 4    Period Weeks    Status Not Met    Target Date 11/20/19      PT SHORT TERM GOAL #3   Title Berg Balance >36/56    Time 4    Period Weeks    Status On-going    Target Date 11/20/19      PT SHORT TERM GOAL #4   Title Patient ambulates 150' with cane & prosthesis with supervision.    Time 4    Period Weeks    Status On-going    Target Date 11/20/19      PT SHORT TERM GOAL #5   Title Patient negotiates ramps & curbs with cane with minimal assist and stairs with 1 rail/cane with supervision with prosthesis.    Time 4    Period Weeks    Status On-going    Target Date 11/20/19             PT Long Term Goals - 09/29/19 2214      PT LONG TERM GOAL #1   Title Patient verbalizes & demonstrates understanding of proper prosthetic care to enable safe utilization of prosthesis.  (All LTGs Target Date: 12/18/2019)    Time 12    Period Weeks    Status On-going    Target Date 12/18/19      PT LONG TERM GOAL #2   Title Patient tolerates prosthesis wear >90% of awake hours without skin issues & limb pain </= 2/10 to enable function throughout his day.    Time 12    Period Weeks    Status On-going    Target Date 12/18/19      PT LONG TERM GOAL #3   Title Berg Balance with prosthesis >/= 45/56 indicating lower fall risk.    Time 12    Period Weeks    Status On-going    Target Date 12/18/19      PT LONG TERM GOAL #4   Title  Patient ambulates 500' with cane or less & prosthesis modified independent for community mobility.    Time 12    Period Weeks    Status On-going    Target Date 12/18/19      PT LONG TERM GOAL #5   Title Patient negotiates ramps, curbs & stairs with single rail with cane or less and prosthesis modified independent to  enable community access.    Time 12    Period Weeks    Status On-going    Target Date 12/18/19      PT LONG TERM GOAL #6   Title Patient reports back & residual limb pain increases </= 2 increments on 0-10 scale with standing & gait activities.    Time 12    Period Weeks    Status On-going    Target Date 12/18/19                 Plan - 11/26/19 1434    Clinical Impression Statement continued to work on [employer] with [facility] however he was limited by medial knee pain today. It appeared that he did not have enough ply socks but he also did not bring any with him. Due to knee pain limiting standing activity moved to seated general strengthening exercises.    Personal Factors and Comorbidities Comorbidity 3+;Fitness;Time since onset of injury/illness/exacerbation;Past/Current Experience    Comorbidities Rt TTA, left 2nd & 3rd toe amp 08/19/2018, ESRD, HTN, CHF, aortic stenosis, left fem-pop bypass, peripheral neuropathy, back spasms    Examination-Activity Limitations Lift;Locomotion Level;Stairs;Stand;Transfers    Examination-Participation Restrictions Community Activity;Shop    Stability/Clinical Decision Making Evolving/Moderate complexity    Rehab Potential Good    PT Frequency 2x / week    PT Duration 12 weeks    PT Treatment/Interventions ADLs/Self Care Home Management;Canalith Repostioning;DME Instruction;Gait training;Stair training;Functional mobility training;Therapeutic activities;Therapeutic exercise;Balance training;Neuromuscular re-education;Patient/family education;Prosthetic Training;Manual techniques;Scar mobilization;Passive range of motion;Dry needling;Joint Manipulations;Vestibular    PT Next Visit Plan work towards [place],  progress balance & gait with TTA prosthesis    PT Home Exercise Plan .ampsink, see pt instructions section on 09/24/19 note    Consulted and Agree with Plan of Care Patient           Patient will benefit from skilled therapeutic intervention in  order to improve the following deficits and impairments:  Abnormal gait, Decreased activity tolerance, Decreased balance, Decreased endurance, Decreased knowledge of use of DME, Decreased mobility, Decreased range of motion, Decreased skin integrity, Decreased scar mobility, Decreased strength, Dizziness, Increased edema, Postural dysfunction, Prosthetic Dependency, Pain  Visit Diagnosis: Muscle weakness (generalized)  Other abnormalities of gait and mobility  Unsteadiness on feet  Abnormal posture  Stiffness of right knee, not elsewhere classified  Stiffness of left knee, not elsewhere classified  Right below-knee amputee (HCC)  Chronic bilateral low back pain without sciatica  Difficulty in walking, not elsewhere classified     Problem List Patient Active Problem List   Diagnosis Date Noted  . Neuropathy 10/06/2019  . Gait abnormality 08/06/2019  . Back muscle spasm 07/09/2019  . Sleep disturbance   . Nausea without vomiting   . Orthostatic hypotension   . Acute on chronic anemia   . Chronic combined systolic and diastolic congestive heart failure ([place])   . ESRD on dialysis ([place])   . Right below-knee amputee ([place]) 05/18/2019  . Pressure injury of skin 05/14/2019  . Enterococcal bacteremia 05/13/2019  . Polymorphic ventricular tachycardia ([place]) 10/24/2018  . S/P TAVR (transcatheter aortic valve replacement) 10/21/2018  .  Anxiety   . Coronary artery disease involving native coronary artery of native heart without angina pectoris   . Thrombocytopenia ([place])   . PAD (peripheral artery disease) ([place]) 07/22/2018  . Acute on chronic combined systolic and diastolic CHF (congestive heart failure) ([place]) 11/30/2017  . ESRD (end stage renal disease) on dialysis ([place])   . Iron deficiency anemia 11/22/2015  . AL amyloidosis ([place]) 09/25/2014  . Anemia of chronic disease 09/25/2014  . Essential hypertension, benign 11/06/2013    Silvestre Mesi 11/26/2019, 2:36 PM  [facility] Physical Therapy [address] [place], [place], [address] Phone: [phone]   Fax:  [phone]  Name: COALTON ARCH MRN: [id number] Date of Birth: February 12, 1951

## 2019-12-01 ENCOUNTER — Other Ambulatory Visit: Payer: Self-pay

## 2019-12-01 ENCOUNTER — Encounter: Payer: Self-pay | Admitting: Physical Therapy

## 2019-12-01 ENCOUNTER — Ambulatory Visit (INDEPENDENT_AMBULATORY_CARE_PROVIDER_SITE_OTHER): Payer: Medicare Other | Admitting: Physical Therapy

## 2019-12-01 DIAGNOSIS — R2681 Unsteadiness on feet: Secondary | ICD-10-CM | POA: Diagnosis not present

## 2019-12-01 DIAGNOSIS — M25661 Stiffness of right knee, not elsewhere classified: Secondary | ICD-10-CM

## 2019-12-01 DIAGNOSIS — M6281 Muscle weakness (generalized): Secondary | ICD-10-CM

## 2019-12-01 DIAGNOSIS — M25662 Stiffness of left knee, not elsewhere classified: Secondary | ICD-10-CM

## 2019-12-01 DIAGNOSIS — R2689 Other abnormalities of gait and mobility: Secondary | ICD-10-CM

## 2019-12-01 DIAGNOSIS — M545 Low back pain: Secondary | ICD-10-CM

## 2019-12-01 DIAGNOSIS — G8929 Other chronic pain: Secondary | ICD-10-CM

## 2019-12-01 DIAGNOSIS — R293 Abnormal posture: Secondary | ICD-10-CM | POA: Diagnosis not present

## 2019-12-01 NOTE — Therapy (Signed)
[facility] [address] [place], [place], [address] Phone: [phone]   Fax:  [phone]  Physical Therapy Treatment  Patient Details  Name: Jose Garrett MRN: [id number] Date of Birth: Aug 03, 1950 Referring Provider (PT): Curt Jews, MD   Encounter Date: 12/01/2019   PT End of Session - 12/01/19 1350    Visit Number 14    Number of Visits 25    Date for PT Re-Evaluation 12/21/19    Authorization Type [employer] Medicare    Authorization Time Period $30 co-pay    Progress Note Due on Visit 20    PT Start Time 1348    PT Stop Time 1430    PT Time Calculation (min) 42 min    Equipment Utilized During Treatment Gait belt    Activity Tolerance Patient tolerated treatment well    Behavior During Therapy [facility] for tasks assessed/performed           Past Medical History:  Diagnosis Date  . Amyloidosis ([place])   . Anemia of chronic disease   . Anxiety   . CAD (coronary artery disease)    a. cardiac cath on 11/25/17 with occlusion of the RCA which could have been his event but the RCA could not be engaged. The RCA filled distally from left to right collaterals. There were no severe stenoses in the left coronary system. Medical therapy recommended, started on plavix.  . Chronic combined systolic and diastolic CHF (congestive heart failure) ([address])    a. EF dropped to 35-40% with grade 2 DD by echo 11/2017.  [name] DJD (degenerative joint disease), lumbar   . ESRD (end stage renal disease) ([place])    ESRD- HEMO MWF- [employer]  . GERD (gastroesophageal reflux disease)   . Hyperlipidemia   . Hypertension   . Myocardial infarction ([place])   . Neuropathy   . Peripheral vascular disease ([place])   . Pneumonia   . Restless legs   . S/P TAVR (transcatheter aortic valve replacement) 10/21/2018   26 mm Edwards Sapien 3 Ultra transcatheter heart valve placed via percutaneous right transfemoral approach   . Severe aortic stenosis    severe    Past Surgical History:    Procedure Laterality Date  . ABDOMINAL AORTOGRAM W/LOWER EXTREMITY Bilateral 07/16/2018   Procedure: ABDOMINAL AORTOGRAM W/LOWER EXTREMITY;  Surgeon: Wellington Hampshire, MD;  Location: [place] [facility];  Service: Cardiovascular;  Laterality: Bilateral;  . ABDOMINAL AORTOGRAM W/LOWER EXTREMITY N/A 04/09/2019   Procedure: ABDOMINAL AORTOGRAM W/LOWER EXTREMITY - Right Lower;  Surgeon: Waynetta Sandy, MD;  Location: [place] [facility];  Service: Cardiovascular;  Laterality: N/A;  . AMPUTATION Left 08/19/2018   Procedure: AMPUTATION LEFT TOES SECOND AND THIRD;  Surgeon: Waynetta Sandy, MD;  Location: [place];  Service: Vascular;  Laterality: Left;  . AMPUTATION Right 05/07/2019   Procedure: AMPUTATION BELOW KNEE;  Surgeon: Serafina Mitchell, MD;  Location: [facility] OR;  Service: Vascular;  Laterality: Right;  . AV FISTULA PLACEMENT Right 12/27/2015   Procedure: Right Arm ARTERIOVENOUS (AV) FISTULA CREATION;  Surgeon: Angelia Mould, MD;  Location: [place];  Service: Vascular;  Laterality: Right;  . CATARACT EXTRACTION W/ INTRAOCULAR LENS  IMPLANT, BILATERAL    . COLONOSCOPY    . ERCP W/ SPHICTEROTOMY    . FEMORAL-POPLITEAL BYPASS GRAFT Left 07/22/2018   Procedure: left FEMORAL TO BELOW KNEE POPLITEAL ARTERY BYPASS GREAFT using 12m removable ring propaten gore graft;  Surgeon: CWaynetta Sandy MD;  Location: [place]  Service: Vascular;  Laterality: Left;  . FEMORAL-POPLITEAL BYPASS GRAFT Right 04/10/2019   Procedure: BYPASS GRAFT RIGHT FEMORAL-POPLITEAL ARTERY;  Surgeon: Rosetta Posner, MD;  Location: [place];  Service: Vascular;  Laterality: Right;  . I & D EXTREMITY Right 05/05/2019   Procedure: Revision of  RIGHT  FOOT Amputation;  Surgeon: Waynetta Sandy, MD;  Location: [place];  Service: Vascular;  Laterality: Right;  . INTRAOPERATIVE TRANSTHORACIC ECHOCARDIOGRAM N/A 10/21/2018   Procedure: Intraoperative Transthoracic Echocardiogram;  Surgeon: Sherren Mocha, MD;   Location: [place];  Service: Open Heart Surgery;  Laterality: N/A;  . IR GENERIC HISTORICAL  01/27/2016   IR FLUORO GUIDE CV LINE RIGHT 01/27/2016 Arne Cleveland, MD [facility]  . IR GENERIC HISTORICAL  01/27/2016   IR US GUIDE VASC ACCESS RIGHT 01/27/2016 Arne Cleveland, MD [facility]  . LAPAROSCOPIC CHOLECYSTECTOMY  03/2011  . LEFT HEART CATH AND CORONARY ANGIOGRAPHY N/A 11/25/2017   Procedure: LEFT HEART CATH AND CORONARY ANGIOGRAPHY;  Surgeon: Martinique, Peter M, MD;  Location: [place] [facility];  Service: Cardiovascular;  Laterality: N/A;  . LEFT HEART CATH AND CORONARY ANGIOGRAPHY N/A 07/24/2018   Procedure: LEFT HEART CATH AND CORONARY ANGIOGRAPHY;  Surgeon: Sherren Mocha, MD;  Location: [place] [facility];  Service: Cardiovascular;  Laterality: N/A;  . RENAL BIOPSY, PERCUTANEOUS Right 09/08/2014  . RIGHT/LEFT HEART CATH AND CORONARY ANGIOGRAPHY N/A 03/25/2018   Procedure: RIGHT/LEFT HEART CATH AND CORONARY ANGIOGRAPHY;  Surgeon: Martinique, Peter M, MD;  Location: [place] [facility];  Service: Cardiovascular;  Laterality: N/A;  . TEE WITHOUT CARDIOVERSION N/A 05/14/2019   Procedure: TRANSESOPHAGEAL ECHOCARDIOGRAM (TEE);  Surgeon: Fay Records, MD;  Location: [facility] ENDOSCOPY;  Service: Cardiovascular;  Laterality: N/A;  . TOE AMPUTATION Left 08/19/2018   2nd & 3rd toes  . TONSILLECTOMY  ~ 1960  . TRANSCATHETER AORTIC VALVE REPLACEMENT, TRANSFEMORAL N/A 10/21/2018   Procedure: TRANSCATHETER AORTIC VALVE REPLACEMENT, TRANSFEMORAL;  Surgeon: Sherren Mocha, MD;  Location: [place];  Service: Open Heart Surgery;  Laterality: N/A;  . TRANSMETATARSAL AMPUTATION Right 04/29/2019   Procedure: RIGHT TRANSMETATARSAL AMPUTATION;  Surgeon: Waynetta Sandy, MD;  Location: [place];  Service: Vascular;  Laterality: Right;  . ULTRASOUND GUIDANCE FOR VASCULAR ACCESS  03/25/2018   Procedure: Ultrasound Guidance For Vascular Access;  Surgeon: Martinique, Peter M, MD;  Location: [place] [facility];  Service:  Cardiovascular;;    There were no vitals filed for this visit.   Subjective Assessment - 12/01/19 1348    Subjective His knee seems to bother him since prosthetist made adjustments ~week ago. He does adjust socks which seems to help.    Patient is accompained by: Family member   dtr, Alysia Penna   Pertinent History Rt TTA, left 2nd & 3rd toe amp 08/19/2018, ESRD, HTN, CHF, aortic stenosis, left fem-pop bypass, peripheral neuropathy, back spasms,    Patient Stated Goals use prosthesis to be active in community, working on car, day travels, walks    Currently in Pain? Yes    Pain Score 6     Pain Location Back    Pain Orientation Lower;Mid    Pain Descriptors / Indicators Aching;Sore;Spasm    Pain Type Chronic pain    Pain Onset More than a month ago    Pain Frequency Constant    Aggravating Factors  standing & walking    Pain Relieving Factors stretches, injections that start back next week    Pain Onset More than a month ago  [place] Adult PT Treatment/Exercise - 12/01/19 1347      Transfers   Sit to Stand 5: Supervision;With upper extremity assist;With armrests;From chair/3-in-1;Other (comment)    Stand to Sit 5: Supervision;With armrests;To chair/3-in-1;With upper extremity assist;Other (comment)      Ambulation/Gait   Ambulation/Gait Yes    Ambulation/Gait Assistance 5: Supervision;4: Min guard    Ambulation Distance (Feet) 75 Feet   75' X 4   Assistive device Straight cane;Prosthesis   stand alone tip on cane   Ambulation Surface Level;Indoor      High Level Balance   High Level Balance Activities Side stepping;Backward walking;Other (comment)   walking with eyes closed   High Level Balance Comments cane with stand alone tip with minA for balance reactions.       Exercises   Other Exercises  --      Lumbar Exercises: Stretches   Passive Hamstring Stretch Left;3 reps;20 seconds      Knee/Hip Exercises: Machines for  Strengthening   Cybex Leg Press Shuttle leg press 62# 10 reps 2 sets with PT assisting knee extension LLE.       Prosthetics   Prosthetic Care Comments  PT reviewed adjusting ply socks increasing from 4-ply to 9-ply and reviewed heel height on shoes. He switched to flat shoes which causes recurvatum in stance. PT placed 3/8" heel wedge in prosthetic side shoe.  PT explained both could cause knee discomfort.  Pt reported improved knee comfort with adjustments.     Current prosthetic wear tolerance (days/week)  daily    Current prosthetic wear tolerance (#hours/day)  reports most of awake hours. Sleeps after dialysis so decreased time but % is most of awake hours.     Residual limb condition  no wounds or skin issues. normal color, dry distally,     Education Provided Correct ply sock adjustment;Other (comment)   see prosthetic care comments.    Person(s) Educated Patient    Education Method Explanation;Demonstration;Tactile cues;Verbal cues    Education Method Verbalized understanding;Tactile cues required;Verbal cues required;Needs further instruction    Donning Prosthesis Supervision                    PT Short Term Goals - 11/17/19 1950      PT SHORT TERM GOAL #1   Title Patient verbalizes understanding of adjusting ply socks with limb volume changes including with dialysis.  (All STGs Target Date:11/20/2019)    Baseline Partially Met 11/17/2019    Time 4    Period Weeks    Status Partially Met    Target Date 11/20/19      PT SHORT TERM GOAL #2   Title Patient tolerates prosthesis wear >90% awake hours / day on non-dialysis days with no increased skin issues    Baseline NOT MET 11/17/2019   Pt tolerates wear 75% on non-dialysis days with no skin issues    Time 4    Period Weeks    Status Not Met    Target Date 11/20/19      PT SHORT TERM GOAL #3   Title Berg Balance >36/56    Time 4    Period Weeks    Status On-going    Target Date 11/20/19      PT SHORT TERM GOAL #4     Title Patient ambulates 150' with cane & prosthesis with supervision.    Time 4    Period Weeks    Status On-going    Target Date 11/20/19  PT SHORT TERM GOAL #5   Title Patient negotiates ramps & curbs with cane with minimal assist and stairs with 1 rail/cane with supervision with prosthesis.    Time 4    Period Weeks    Status On-going    Target Date 11/20/19             PT Long Term Goals - 09/29/19 2214      PT LONG TERM GOAL #1   Title Patient verbalizes & demonstrates understanding of proper prosthetic care to enable safe utilization of prosthesis.  (All LTGs Target Date: 12/18/2019)    Time 12    Period Weeks    Status On-going    Target Date 12/18/19      PT LONG TERM GOAL #2   Title Patient tolerates prosthesis wear >90% of awake hours without skin issues & limb pain </= 2/10 to enable function throughout his day.    Time 12    Period Weeks    Status On-going    Target Date 12/18/19      PT LONG TERM GOAL #3   Title Berg Balance with prosthesis >/= 45/56 indicating lower fall risk.    Time 12    Period Weeks    Status On-going    Target Date 12/18/19      PT LONG TERM GOAL #4   Title Patient ambulates 500' with cane or less & prosthesis modified independent for community mobility.    Time 12    Period Weeks    Status On-going    Target Date 12/18/19      PT LONG TERM GOAL #5   Title Patient negotiates ramps, curbs & stairs with single rail with cane or less and prosthesis modified independent to enable community access.    Time 12    Period Weeks    Status On-going    Target Date 12/18/19      PT LONG TERM GOAL #6   Title Patient reports back & residual limb pain increases </= 2 increments on 0-10 scale with standing & gait activities.    Time 12    Period Weeks    Status On-going    Target Date 12/18/19                 Plan - 12/01/19 1350    Clinical Impression Statement PT arrived with knee discomfort. He was wearing too few  ply socks causing limb to be seated too deep into socket with Patella Tendon bar pressing on the patella. He also had changed shoes to flats and PT added 3/8" heel wedge along with educating on need to keep pitch of shoes the same.  Both changes decreased his knee discomfort.  PT session also worked on prosthetic gait with cane stand alone tip and balance activities.    Personal Factors and Comorbidities Comorbidity 3+;Fitness;Time since onset of injury/illness/exacerbation;Past/Current Experience    Comorbidities Rt TTA, left 2nd & 3rd toe amp 08/19/2018, ESRD, HTN, CHF, aortic stenosis, left fem-pop bypass, peripheral neuropathy, back spasms    Examination-Activity Limitations Lift;Locomotion Level;Stairs;Stand;Transfers    Examination-Participation Restrictions Community Activity;Shop    Stability/Clinical Decision Making Evolving/Moderate complexity    Rehab Potential Good    PT Frequency 2x / week    PT Duration 12 weeks    PT Treatment/Interventions ADLs/Self Care Home Management;Canalith Repostioning;DME Instruction;Gait training;Stair training;Functional mobility training;Therapeutic activities;Therapeutic exercise;Balance training;Neuromuscular re-education;Patient/family education;Prosthetic Training;Manual techniques;Scar mobilization;Passive range of motion;Dry needling;Joint Manipulations;Vestibular    PT Next Visit Plan work  towards LTGs,  progress balance & gait with TTA prosthesis    PT Home Exercise Plan .ampsink, see pt instructions section on 09/24/19 note    Consulted and Agree with Plan of Care Patient           Patient will benefit from skilled therapeutic intervention in order to improve the following deficits and impairments:  Abnormal gait, Decreased activity tolerance, Decreased balance, Decreased endurance, Decreased knowledge of use of DME, Decreased mobility, Decreased range of motion, Decreased skin integrity, Decreased scar mobility, Decreased strength, Dizziness,  Increased edema, Postural dysfunction, Prosthetic Dependency, Pain  Visit Diagnosis: Muscle weakness (generalized)  Other abnormalities of gait and mobility  Unsteadiness on feet  Abnormal posture  Stiffness of right knee, not elsewhere classified  Stiffness of left knee, not elsewhere classified  Chronic bilateral low back pain without sciatica     Problem List Patient Active Problem List   Diagnosis Date Noted  . Neuropathy 10/06/2019  . Gait abnormality 08/06/2019  . Back muscle spasm 07/09/2019  . Sleep disturbance   . Nausea without vomiting   . Orthostatic hypotension   . Acute on chronic anemia   . Chronic combined systolic and diastolic congestive heart failure ([place])   . ESRD on dialysis ([place])   . Right below-knee amputee ([place]) 05/18/2019  . Pressure injury of skin 05/14/2019  . Enterococcal bacteremia 05/13/2019  . Polymorphic ventricular tachycardia ([place]) 10/24/2018  . S/P TAVR (transcatheter aortic valve replacement) 10/21/2018  . Anxiety   . Coronary artery disease involving native coronary artery of native heart without angina pectoris   . Thrombocytopenia ([place])   . PAD (peripheral artery disease) ([place]) 07/22/2018  . Acute on chronic combined systolic and diastolic CHF (congestive heart failure) ([place]) 11/30/2017  . ESRD (end stage renal disease) on dialysis ([place])   . Iron deficiency anemia 11/22/2015  . AL amyloidosis ([place]) 09/25/2014  . Anemia of chronic disease 09/25/2014  . Essential hypertension, benign 11/06/2013    Jamey Reas PT, DPT 12/01/2019, 3:26 PM  [facility] [address] [place], [place], [address] Phone: [phone]   Fax:  [phone]  Name: LEVIE WAGES MRN: [id number] Date of Birth: 1950/11/24

## 2019-12-03 ENCOUNTER — Encounter: Payer: Medicare Other | Admitting: Physical Therapy

## 2019-12-08 ENCOUNTER — Encounter: Payer: Medicare Other | Admitting: Physical Therapy

## 2019-12-15 ENCOUNTER — Encounter: Payer: Medicare Other | Admitting: Physical Therapy

## 2019-12-15 ENCOUNTER — Encounter: Payer: Self-pay | Admitting: Physical Therapy

## 2019-12-15 ENCOUNTER — Other Ambulatory Visit: Payer: Self-pay

## 2019-12-15 ENCOUNTER — Ambulatory Visit: Payer: Medicare Other | Admitting: Physical Therapy

## 2019-12-15 DIAGNOSIS — R2689 Other abnormalities of gait and mobility: Secondary | ICD-10-CM

## 2019-12-15 DIAGNOSIS — G8929 Other chronic pain: Secondary | ICD-10-CM

## 2019-12-15 DIAGNOSIS — R293 Abnormal posture: Secondary | ICD-10-CM | POA: Diagnosis not present

## 2019-12-15 DIAGNOSIS — R2681 Unsteadiness on feet: Secondary | ICD-10-CM

## 2019-12-15 DIAGNOSIS — M545 Low back pain, unspecified: Secondary | ICD-10-CM

## 2019-12-15 DIAGNOSIS — M25662 Stiffness of left knee, not elsewhere classified: Secondary | ICD-10-CM

## 2019-12-15 DIAGNOSIS — M25661 Stiffness of right knee, not elsewhere classified: Secondary | ICD-10-CM

## 2019-12-15 DIAGNOSIS — M6281 Muscle weakness (generalized): Secondary | ICD-10-CM | POA: Diagnosis not present

## 2019-12-15 NOTE — Therapy (Signed)
[facility] Physical Therapy [address] [place], [place], [address] Phone: [phone]   Fax:  [phone]  Physical Therapy Treatment & Recertification  Patient Details  Name: Jose Garrett MRN: [id number] Date of Birth: 16-Jul-1950 Referring Provider (PT): Curt Jews, MD   Encounter Date: 12/15/2019   PT End of Session - 12/15/19 1347    Visit Number 15    Number of Visits 39    Date for PT Re-Evaluation 03/11/20    Authorization Type [employer] Medicare    Authorization Time Period $30 co-pay    Progress Note Due on Visit 20    PT Start Time 1345    PT Stop Time 1430    PT Time Calculation (min) 45 min    Equipment Utilized During Treatment Gait belt    Activity Tolerance Patient tolerated treatment well    Behavior During Therapy [facility] for tasks assessed/performed           Past Medical History:  Diagnosis Date  . Amyloidosis ([place])   . Anemia of chronic disease   . Anxiety   . CAD (coronary artery disease)    a. cardiac cath on 11/25/17 with occlusion of the RCA which could have been his event but the RCA could not be engaged. The RCA filled distally from left to right collaterals. There were no severe stenoses in the left coronary system. Medical therapy recommended, started on plavix.  . Chronic combined systolic and diastolic CHF (congestive heart failure) ([place])    a. EF dropped to 35-40% with grade 2 DD by echo 11/2017.  [name] DJD (degenerative joint disease), lumbar   . ESRD (end stage renal disease) ([place])    ESRD- HEMO MWF- [employer]  . GERD (gastroesophageal reflux disease)   . Hyperlipidemia   . Hypertension   . Myocardial infarction ([place])   . Neuropathy   . Peripheral vascular disease ([place])   . Pneumonia   . Restless legs   . S/P TAVR (transcatheter aortic valve replacement) 10/21/2018   26 mm Edwards Sapien 3 Ultra transcatheter heart valve placed via percutaneous right transfemoral approach   . Severe aortic stenosis    severe    Past  Surgical History:  Procedure Laterality Date  . ABDOMINAL AORTOGRAM W/LOWER EXTREMITY Bilateral 07/16/2018   Procedure: ABDOMINAL AORTOGRAM W/LOWER EXTREMITY;  Surgeon: Wellington Hampshire, MD;  Location: [place] [facility];  Service: Cardiovascular;  Laterality: Bilateral;  . ABDOMINAL AORTOGRAM W/LOWER EXTREMITY N/A 04/09/2019   Procedure: ABDOMINAL AORTOGRAM W/LOWER EXTREMITY - Right Lower;  Surgeon: Waynetta Sandy, MD;  Location: [place] [facility];  Service: Cardiovascular;  Laterality: N/A;  . AMPUTATION Left 08/19/2018   Procedure: AMPUTATION LEFT TOES SECOND AND THIRD;  Surgeon: Waynetta Sandy, MD;  Location: [place];  Service: Vascular;  Laterality: Left;  . AMPUTATION Right 05/07/2019   Procedure: AMPUTATION BELOW KNEE;  Surgeon: Serafina Mitchell, MD;  Location[facility];  Service: Vascular;  Laterality: Right;  . AV FISTULA PLACEMENT Right 12/27/2015   Procedure: Right Arm ARTERIOVENOUS (AV) FISTULA CREATION;  Surgeon: Angelia Mould, MD;  Location: [place];  Service: Vascular;  Laterality: Right;  . CATARACT EXTRACTION W/ INTRAOCULAR LENS  IMPLANT, BILATERAL    . COLONOSCOPY    . ERCP W/ SPHICTEROTOMY    . FEMORAL-POPLITEAL BYPASS GRAFT Left 07/22/2018   Procedure: left FEMORAL TO BELOW KNEE POPLITEAL ARTERY BYPASS GREAFT using 12m removable ring propaten gore graft;  Surgeon: CWaynetta Sandy MD;  Location: [place]  Service: Vascular;  Laterality: Left;  . FEMORAL-POPLITEAL BYPASS GRAFT Right 04/10/2019   Procedure: BYPASS GRAFT RIGHT FEMORAL-POPLITEAL ARTERY;  Surgeon: Rosetta Posner, MD;  Location: [place];  Service: Vascular;  Laterality: Right;  . I & D EXTREMITY Right 05/05/2019   Procedure: Revision of  RIGHT  FOOT Amputation;  Surgeon: Waynetta Sandy, MD;  Location: [place];  Service: Vascular;  Laterality: Right;  . INTRAOPERATIVE TRANSTHORACIC ECHOCARDIOGRAM N/A 10/21/2018   Procedure: Intraoperative Transthoracic Echocardiogram;  Surgeon: Sherren Mocha, MD;  Location: [place];  Service: Open Heart Surgery;  Laterality: N/A;  . IR GENERIC HISTORICAL  01/27/2016   IR FLUORO GUIDE CV LINE RIGHT 01/27/2016 Arne Cleveland, MD [facility]INTER[facility]  . IR GENERIC HISTORICAL  01/27/2016   IR US GUIDE VASC ACCESS RIGHT 01/27/2016 Arne Cleveland, MD [facility]  . LAPAROSCOPIC CHOLECYSTECTOMY  03/2011  . LEFT HEART CATH AND CORONARY ANGIOGRAPHY N/A 11/25/2017   Procedure: LEFT HEART CATH AND CORONARY ANGIOGRAPHY;  Surgeon: Martinique, Peter M, MD;  Location: [place] [facility];  Service: Cardiovascular;  Laterality: N/A;  . LEFT HEART CATH AND CORONARY ANGIOGRAPHY N/A 07/24/2018   Procedure: LEFT HEART CATH AND CORONARY ANGIOGRAPHY;  Surgeon: Sherren Mocha, MD;  Location: [place] [facility];  Service: Cardiovascular;  Laterality: N/A;  . RENAL BIOPSY, PERCUTANEOUS Right 09/08/2014  . RIGHT/LEFT HEART CATH AND CORONARY ANGIOGRAPHY N/A 03/25/2018   Procedure: RIGHT/LEFT HEART CATH AND CORONARY ANGIOGRAPHY;  Surgeon: Martinique, Peter M, MD;  Location: [place] [facility];  Service: Cardiovascular;  Laterality: N/A;  . TEE WITHOUT CARDIOVERSION N/A 05/14/2019   Procedure: TRANSESOPHAGEAL ECHOCARDIOGRAM (TEE);  Surgeon: Fay Records, MD;  Location: [facility];  Service: Cardiovascular;  Laterality: N/A;  . TOE AMPUTATION Left 08/19/2018   2nd & 3rd toes  . TONSILLECTOMY  ~ 1960  . TRANSCATHETER AORTIC VALVE REPLACEMENT, TRANSFEMORAL N/A 10/21/2018   Procedure: TRANSCATHETER AORTIC VALVE REPLACEMENT, TRANSFEMORAL;  Surgeon: Sherren Mocha, MD;  Location: [place];  Service: Open Heart Surgery;  Laterality: N/A;  . TRANSMETATARSAL AMPUTATION Right 04/29/2019   Procedure: RIGHT TRANSMETATARSAL AMPUTATION;  Surgeon: Waynetta Sandy, MD;  Location: [place];  Service: Vascular;  Laterality: Right;  . ULTRASOUND GUIDANCE FOR VASCULAR ACCESS  03/25/2018   Procedure: Ultrasound Guidance For Vascular Access;  Surgeon: Martinique, Peter M, MD;  Location: [place] [facility];   Service: Cardiovascular;;    There were no vitals filed for this visit.   Subjective Assessment - 12/15/19 1345    Subjective He is wearing prosthesis most of awake hours. without issues. Dialysis has been rough.    Patient is accompained by: Family member   dtr, Alysia Penna   Pertinent History Rt TTA, left 2nd & 3rd toe amp 08/19/2018, ESRD, HTN, CHF, aortic stenosis, left fem-pop bypass, peripheral neuropathy, back spasms,    Patient Stated Goals use prosthesis to be active in community, working on car, day travels, walks    Currently in Pain? Yes    Pain Score 7     Pain Location Back    Pain Orientation Lower;Mid    Pain Descriptors / Indicators Spasm;Sharp    Pain Type Chronic pain    Pain Radiating Towards down right leg to knee    Pain Onset More than a month ago    Pain Frequency Constant    Aggravating Factors  static or wrong position    Pain Relieving Factors steriod shots, moving help,    Pain Onset More than a month ago  [place] Adult PT Treatment/Exercise - 12/15/19 1345      Transfers   Sit to Stand 5: Supervision;With upper extremity assist;With armrests;From chair/3-in-1;Other (comment)    Stand to Sit 5: Supervision;With armrests;To chair/3-in-1;With upper extremity assist;Other (comment)      Ambulation/Gait   Ambulation/Gait Yes    Ambulation/Gait Assistance 4: Min assist    Ambulation/Gait Assistance Details verbal cues on sequencing, posture and assist for balance reactions.     Ambulation Distance (Feet) 150 Feet   150' X 4   Assistive device Straight cane;Prosthesis   stand alone tip on cane   Ambulation Surface Level;Indoor    Ramp 4: Min assist;3: Mod assist   cane stand alone tip & TTA prosthesis   Ramp Details (indicate cue type and reason) demo, tactile & verbal cues on technique    Curb 4: Min assist   cane stand alone tip & TTA prosthesis   Curb Details (indicate cue type and reason) demo, tactile & verbal  cues on sequence & technique including step through      High Level Balance   High Level Balance Activities Side stepping;Backward walking;Other (comment)   walking with eyes closed   High Level Balance Comments cane with stand alone tip with minA for balance reactions.       Lumbar Exercises: Stretches   Passive Hamstring Stretch Left;3 reps;20 seconds      Knee/Hip Exercises: Machines for Strengthening   Cybex Leg Press --      Prosthetics   Prosthetic Care Comments  PT reviewed adjusting ply socks increasing from 4-ply to 9-ply     Current prosthetic wear tolerance (days/week)  daily    Current prosthetic wear tolerance (#hours/day)  reports most of awake hours. Sleeps after dialysis so decreased time but % is most of awake hours.     Residual limb condition  no wounds or skin issues. normal color, dry distally,     Education Provided Correct ply sock adjustment;Other (comment)   see prosthetic care comments.    Person(s) Educated Patient    Education Method Explanation;Demonstration;Tactile cues;Verbal cues    Education Method Verbalized understanding;Verbal cues required;Needs further instruction                    PT Short Term Goals - 12/15/19 1448      PT SHORT TERM GOAL #1   Title Patient verbalizes understanding of adjusting ply socks with limb volume changes including with dialysis.  (All STGs Target Date: 01/15/2020)    Time 4    Period Weeks    Status On-going    Target Date 01/15/20      PT SHORT TERM GOAL #2   Title Patient reports back pain increases </= 3 increments on 0-10 scale.    Baseline --    Time 4    Period Weeks    Status New    Target Date 01/15/20      PT SHORT TERM GOAL #3   Title Berg Balance >36/56    Time 4    Period Weeks    Status On-going    Target Date 01/15/20      PT SHORT TERM GOAL #4   Title Patient ambulates 250' with cane & prosthesis with supervision.    Time 4    Period Weeks    Status On-going    Target Date  01/15/20      PT SHORT TERM GOAL #5   Title Patient negotiates ramps & curbs with  cane with minimal assist and stairs with 1 rail/cane with supervision with prosthesis.    Time 4    Period Weeks    Status On-going    Target Date 01/15/20             PT Long Term Goals - 12/15/19 1445      PT LONG TERM GOAL #1   Title Patient verbalizes & demonstrates understanding of proper prosthetic care to enable safe utilization of prosthesis.  (All LTGs Target Date: 12/18/2019)    Baseline 12/15/2019 not met He still requires cues for adjusting ply socks which is critical with fluid fluctuations associated with dialysis.    Time 12    Period Weeks    Status Not Met      PT LONG TERM GOAL #2   Title Patient tolerates prosthesis wear >90% of awake hours without skin issues & limb pain </= 2/10 to enable function throughout his day.    Baseline MET 12/15/2019    Time 12    Period Weeks    Status Achieved      PT LONG TERM GOAL #3   Title Berg Balance with prosthesis >/= 45/56 indicating lower fall risk.    Baseline 12/15/2019 not met    Time 12    Period Weeks    Status Not Met      PT LONG TERM GOAL #4   Title Patient ambulates 500' with cane or less & prosthesis modified independent for community mobility.    Baseline 12/15/2019 not met    Time 12    Period Weeks    Status Not Met      PT LONG TERM GOAL #5   Title Patient negotiates ramps, curbs & stairs with single rail with cane or less and prosthesis modified independent to enable community access.    Baseline 12/15/2019 not met    Time 12    Period Weeks    Status Not Met      PT LONG TERM GOAL #6   Title Patient reports back & residual limb pain increases </= 2 increments on 0-10 scale with standing & gait activities.    Baseline 12/15/2019 not met    Time 12    Period Weeks    Status Not Met              PT Long Term Goals - 12/15/19 1459      PT LONG TERM GOAL #1   Title Patient verbalizes & demonstrates  understanding of proper prosthetic care to enable safe utilization of prosthesis.  (All LTGs Target Date: 03/11/2020)    Time 12    Period Weeks    Status On-going    Target Date 03/11/20      PT LONG TERM GOAL #2   Title Patient tolerates prosthesis wear >90% of awake hours without skin issues & limb pain </= 2/10 to enable function throughout his day.    Time 12    Period Weeks    Status On-going    Target Date 03/11/20      PT LONG TERM GOAL #3   Title Berg Balance with prosthesis >/= 45/56 indicating lower fall risk.    Time 12    Period Weeks    Status On-going    Target Date 03/11/20      PT LONG TERM GOAL #4   Title Patient ambulates 500' with cane or less & prosthesis modified independent for community mobility.    Baseline 12/15/2019 not  met    Time 12    Period Weeks    Status On-going    Target Date 03/11/20      PT LONG TERM GOAL #5   Title Patient negotiates ramps, curbs & stairs with single rail with cane or less and prosthesis modified independent to enable community access.    Time 12    Period Weeks    Status On-going    Target Date 03/11/20      PT LONG TERM GOAL #6   Title Patient reports back & residual limb pain increases </= 2 increments on 0-10 scale with standing & gait activities.    Time 12    Period Weeks    Status On-going    Target Date 03/11/20                Plan - 12/15/19 1348    Clinical Impression Statement Patient appears will need additional PT to meet his functional potential. His chronic back pain & fatigue with dialysis have slowed his progress. He has made progress but not to level that indicates safe prosthetic function yet.    Personal Factors and Comorbidities Comorbidity 3+;Fitness;Time since onset of injury/illness/exacerbation;Past/Current Experience    Comorbidities Rt TTA, left 2nd & 3rd toe amp 08/19/2018, ESRD, HTN, CHF, aortic stenosis, left fem-pop bypass, peripheral neuropathy, back spasms    Examination-Activity  Limitations Lift;Locomotion Level;Stairs;Stand;Transfers    Examination-Participation Restrictions Community Activity;Shop    Stability/Clinical Decision Making Evolving/Moderate complexity    Rehab Potential Good    PT Frequency 2x / week    PT Duration 12 weeks    PT Treatment/Interventions ADLs/Self Care Home Management;Canalith Repostioning;DME Instruction;Gait training;Stair training;Functional mobility training;Therapeutic activities;Therapeutic exercise;Balance training;Neuromuscular re-education;Patient/family education;Prosthetic Training;Manual techniques;Scar mobilization;Passive range of motion;Dry needling;Joint Manipulations;Vestibular    PT Next Visit Plan work towards updated STGs,  progress balance & gait with TTA prosthesis    PT Home Exercise Plan .ampsink, see pt instructions section on 09/24/19 note    Consulted and Agree with Plan of Care Patient           Patient will benefit from skilled therapeutic intervention in order to improve the following deficits and impairments:  Abnormal gait, Decreased activity tolerance, Decreased balance, Decreased endurance, Decreased knowledge of use of DME, Decreased mobility, Decreased range of motion, Decreased skin integrity, Decreased scar mobility, Decreased strength, Dizziness, Increased edema, Postural dysfunction, Prosthetic Dependency, Pain  Visit Diagnosis: Muscle weakness (generalized)  Other abnormalities of gait and mobility  Unsteadiness on feet  Abnormal posture  Stiffness of right knee, not elsewhere classified  Stiffness of left knee, not elsewhere classified  Chronic bilateral low back pain without sciatica     Problem List Patient Active Problem List   Diagnosis Date Noted  . Neuropathy 10/06/2019  . Gait abnormality 08/06/2019  . Back muscle spasm 07/09/2019  . Sleep disturbance   . Nausea without vomiting   . Orthostatic hypotension   . Acute on chronic anemia   . Chronic combined systolic and  diastolic congestive heart failure ([place])   . ESRD on dialysis ([place])   . Right below-knee amputee ([place]) 05/18/2019  . Pressure injury of skin 05/14/2019  . Enterococcal bacteremia 05/13/2019  . Polymorphic ventricular tachycardia ([place]) 10/24/2018  . S/P TAVR (transcatheter aortic valve replacement) 10/21/2018  . Anxiety   . Coronary artery disease involving native coronary artery of native heart without angina pectoris   . Thrombocytopenia ([place])   . PAD (peripheral artery disease) ([place]) 07/22/2018  . Acute  on chronic combined systolic and diastolic CHF (congestive heart failure) ([place]) 11/30/2017  . ESRD (end stage renal disease) on dialysis ([place])   . Iron deficiency anemia 11/22/2015  . AL amyloidosis ([place]) 09/25/2014  . Anemia of chronic disease 09/25/2014  . Essential hypertension, benign 11/06/2013    Jamey Reas PT, DPT 12/15/2019, 2:58 PM  [facility] Physical Therapy [address] [place], [place], [address] Phone: [phone]   Fax:  [phone]  Name: Jose Garrett MRN: [id number] Date of Birth: October 13, 1950

## 2019-12-22 ENCOUNTER — Other Ambulatory Visit: Payer: Self-pay

## 2019-12-22 ENCOUNTER — Encounter: Payer: Self-pay | Admitting: Physical Therapy

## 2019-12-22 ENCOUNTER — Ambulatory Visit (INDEPENDENT_AMBULATORY_CARE_PROVIDER_SITE_OTHER): Payer: Medicare Other | Admitting: Physical Therapy

## 2019-12-22 DIAGNOSIS — M6281 Muscle weakness (generalized): Secondary | ICD-10-CM | POA: Diagnosis not present

## 2019-12-22 DIAGNOSIS — R293 Abnormal posture: Secondary | ICD-10-CM | POA: Diagnosis not present

## 2019-12-22 DIAGNOSIS — M25661 Stiffness of right knee, not elsewhere classified: Secondary | ICD-10-CM

## 2019-12-22 DIAGNOSIS — R2689 Other abnormalities of gait and mobility: Secondary | ICD-10-CM | POA: Diagnosis not present

## 2019-12-22 DIAGNOSIS — R2681 Unsteadiness on feet: Secondary | ICD-10-CM | POA: Diagnosis not present

## 2019-12-22 DIAGNOSIS — M25662 Stiffness of left knee, not elsewhere classified: Secondary | ICD-10-CM

## 2019-12-22 DIAGNOSIS — M545 Low back pain, unspecified: Secondary | ICD-10-CM

## 2019-12-22 DIAGNOSIS — G8929 Other chronic pain: Secondary | ICD-10-CM

## 2019-12-22 NOTE — Therapy (Signed)
[facility] [address] [place], [place], [address] Phone: [phone]   Fax:  [phone]  Physical Therapy Treatment  Patient Details  Name: Jose Garrett MRN: [id number] Date of Birth: [birth year] Referring Provider (PT): Curt Jews, MD   Encounter Date: 12/22/2019   PT End of Session - 12/22/19 1345    Visit Number 16    Number of Visits 39    Date for PT Re-Evaluation 03/11/20    Authorization Type [employer] Medicare    Authorization Time Period $30 co-pay    Progress Note Due on Visit 20    PT Start Time 1345    PT Stop Time 1425    PT Time Calculation (min) 40 min    Equipment Utilized During Treatment Gait belt    Activity Tolerance Patient tolerated treatment well    Behavior During Therapy WFL for tasks assessed/performed           Past Medical History:  Diagnosis Date  . Amyloidosis ([place])   . Anemia of chronic disease   . Anxiety   . CAD (coronary artery disease)    a. cardiac cath on 11/25/17 with occlusion of the RCA which could have been his event but the RCA could not be engaged. The RCA filled distally from left to right collaterals. There were no severe stenoses in the left coronary system. Medical therapy recommended, started on plavix.  . Chronic combined systolic and diastolic CHF (congestive heart failure) ([place])    a. EF dropped to 35-40% with grade 2 DD by echo 11/2017.  [name] DJD (degenerative joint disease), lumbar   . ESRD (end stage renal disease) ([place])    ESRD- HEMO MWF- [employer]  . GERD (gastroesophageal reflux disease)   . Hyperlipidemia   . Hypertension   . Myocardial infarction ([place])   . Neuropathy   . Peripheral vascular disease ([place])   . Pneumonia   . Restless legs   . S/P TAVR (transcatheter aortic valve replacement) 10/21/2018   26 mm Edwards Sapien 3 Ultra transcatheter heart valve placed via percutaneous right transfemoral approach   . Severe aortic stenosis    severe    Past Surgical History:    Procedure Laterality Date  . ABDOMINAL AORTOGRAM W/LOWER EXTREMITY Bilateral 07/16/2018   Procedure: ABDOMINAL AORTOGRAM W/LOWER EXTREMITY;  Surgeon: Wellington Hampshire, MD;  Location: [place] [facility];  Service: Cardiovascular;  Laterality: Bilateral;  . ABDOMINAL AORTOGRAM W/LOWER EXTREMITY N/A 04/09/2019   Procedure: ABDOMINAL AORTOGRAM W/LOWER EXTREMITY - Right Lower;  Surgeon: Waynetta Sandy, MD;  Location: [place] [facility];  Service: Cardiovascular;  Laterality: N/A;  . AMPUTATION Left 08/19/2018   Procedure: AMPUTATION LEFT TOES SECOND AND THIRD;  Surgeon: Waynetta Sandy, MD;  Location: [place];  Service: Vascular;  Laterality: Left;  . AMPUTATION Right 05/07/2019   Procedure: AMPUTATION BELOW KNEE;  Surgeon: Serafina Mitchell, MD;  Location: [facility] OR;  Service: Vascular;  Laterality: Right;  . AV FISTULA PLACEMENT Right 12/27/2015   Procedure: Right Arm ARTERIOVENOUS (AV) FISTULA CREATION;  Surgeon: Angelia Mould, MD;  Location: [place];  Service: Vascular;  Laterality: Right;  . CATARACT EXTRACTION W/ INTRAOCULAR LENS  IMPLANT, BILATERAL    . COLONOSCOPY    . ERCP W/ SPHICTEROTOMY    . FEMORAL-POPLITEAL BYPASS GRAFT Left 07/22/2018   Procedure: left FEMORAL TO BELOW KNEE POPLITEAL ARTERY BYPASS GREAFT using 11m removable ring propaten gore graft;  Surgeon: CWaynetta Sandy MD;  Location[place]  Service: Vascular;  Laterality: Left;  . FEMORAL-POPLITEAL BYPASS GRAFT Right 04/10/2019   Procedure: BYPASS GRAFT RIGHT FEMORAL-POPLITEAL ARTERY;  Surgeon: Rosetta Posner, MD;  Location: [place];  Service: Vascular;  Laterality: Right;  . I & D EXTREMITY Right 05/05/2019   Procedure: Revision of  RIGHT  FOOT Amputation;  Surgeon: Waynetta Sandy, MD;  Location: [place];  Service: Vascular;  Laterality: Right;  . INTRAOPERATIVE TRANSTHORACIC ECHOCARDIOGRAM N/A 10/21/2018   Procedure: Intraoperative Transthoracic Echocardiogram;  Surgeon: Sherren Mocha, MD;   Location: [place];  Service: Open Heart Surgery;  Laterality: N/A;  . IR GENERIC HISTORICAL  01/27/2016   IR FLUORO GUIDE CV LINE RIGHT 01/27/2016 Arne Cleveland, MD [facility]  . IR GENERIC HISTORICAL  01/27/2016   IR US GUIDE VASC ACCESS RIGHT 01/27/2016 Arne Cleveland, MD [facility]  . LAPAROSCOPIC CHOLECYSTECTOMY  03/2011  . LEFT HEART CATH AND CORONARY ANGIOGRAPHY N/A 11/25/2017   Procedure: LEFT HEART CATH AND CORONARY ANGIOGRAPHY;  Surgeon: Martinique, Peter M, MD;  Location: [place] [facility];  Service: Cardiovascular;  Laterality: N/A;  . LEFT HEART CATH AND CORONARY ANGIOGRAPHY N/A 07/24/2018   Procedure: LEFT HEART CATH AND CORONARY ANGIOGRAPHY;  Surgeon: Sherren Mocha, MD;  Location: [place] [facility];  Service: Cardiovascular;  Laterality: N/A;  . RENAL BIOPSY, PERCUTANEOUS Right 09/08/2014  . RIGHT/LEFT HEART CATH AND CORONARY ANGIOGRAPHY N/A 03/25/2018   Procedure: RIGHT/LEFT HEART CATH AND CORONARY ANGIOGRAPHY;  Surgeon: Martinique, Peter M, MD;  Location: [place] [facility];  Service: Cardiovascular;  Laterality: N/A;  . TEE WITHOUT CARDIOVERSION N/A 05/14/2019   Procedure: TRANSESOPHAGEAL ECHOCARDIOGRAM (TEE);  Surgeon: Fay Records, MD;  Location: [facility] ENDOSCOPY;  Service: Cardiovascular;  Laterality: N/A;  . TOE AMPUTATION Left 08/19/2018   2nd & 3rd toes  . TONSILLECTOMY  ~ 1960  . TRANSCATHETER AORTIC VALVE REPLACEMENT, TRANSFEMORAL N/A 10/21/2018   Procedure: TRANSCATHETER AORTIC VALVE REPLACEMENT, TRANSFEMORAL;  Surgeon: Sherren Mocha, MD;  Location: [place];  Service: Open Heart Surgery;  Laterality: N/A;  . TRANSMETATARSAL AMPUTATION Right 04/29/2019   Procedure: RIGHT TRANSMETATARSAL AMPUTATION;  Surgeon: Waynetta Sandy, MD;  Location: [place];  Service: Vascular;  Laterality: Right;  . ULTRASOUND GUIDANCE FOR VASCULAR ACCESS  03/25/2018   Procedure: Ultrasound Guidance For Vascular Access;  Surgeon: Martinique, Peter M, MD;  Location: [place] [facility];  Service:  Cardiovascular;;    There were no vitals filed for this visit.   Subjective Assessment - 12/22/19 1345    Subjective He had spinal injection on 12/17/2019 which had significant improvement for 2 days. Now better than prior to injection but not as much relief as first 2 days.  He is wearing prosthesis most of awake hours.    Patient is accompained by: Family member   dtr, Alysia Penna   Pertinent History Rt TTA, left 2nd & 3rd toe amp 08/19/2018, ESRD, HTN, CHF, aortic stenosis, left fem-pop bypass, peripheral neuropathy, back spasms,    Patient Stated Goals use prosthesis to be active in community, working on car, day travels, walks    Currently in Pain? Yes    Pain Score 5     Pain Location Back    Pain Orientation Lower;Mid    Pain Descriptors / Indicators Spasm;Sharp    Pain Type Chronic pain    Pain Onset More than a month ago    Pain Frequency Constant    Aggravating Factors  sitting in dialysis chair.    Pain Relieving Factors medications  Pain Onset More than a month ago                             [facility] Adult PT Treatment/Exercise - 12/22/19 1345      Transfers   Sit to Stand 5: Supervision;With upper extremity assist;With armrests;From chair/3-in-1;Other (comment)    Stand to Sit 5: Supervision;With armrests;To chair/3-in-1;With upper extremity assist;Other (comment)      Ambulation/Gait   Ambulation/Gait Yes    Ambulation/Gait Assistance 4: Min assist    Ambulation/Gait Assistance Details verbal cues on cane sequence, posture with not staring at floor and balance reactions.     Ambulation Distance (Feet) 150 Feet   150' X 2   Assistive device Straight cane;Prosthesis   stand alone tip on cane   Ambulation Surface Level;Indoor    Ramp 4: Min assist   cane stand alone tip & TTA prosthesis   Ramp Details (indicate cue type and reason) demo, tactile & verbal cues on technique    Curb 4: Min assist   cane stand alone tip & TTA prosthesis   Curb Details  (indicate cue type and reason) 6 inch curb cues and demo for proper technique and sequencing      High Level Balance   High Level Balance Activities Side stepping;Backward walking;Other (comment)    High Level Balance Comments cane with stand alone tip with minA for balance reactions.       Lumbar Exercises: Stretches   Passive Hamstring Stretch --      Knee/Hip Exercises: Aerobic   Nustep Level 5 for 8 min with BLEs & BUEs      Prosthetics   Prosthetic Care Comments  PT reviewed adjusting ply socks & changing shoes with same heel height.     Current prosthetic wear tolerance (days/week)  daily    Current prosthetic wear tolerance (#hours/day)  reports most of awake hours. Sleeps after dialysis so decreased time but % is most of awake hours.     Residual limb condition  no wounds or skin issues. normal color, dry distally,     Education Provided Correct ply sock adjustment;Other (comment)   see prosthetic care comments.    Person(s) Educated Patient    Education Method Explanation;Verbal cues;Demonstration;Tactile cues    Education Method Verbalized understanding;Returned demonstration;Tactile cues required;Verbal cues required;Needs further instruction    Donning Prosthesis Supervision                  PT Education - 12/22/19 1358    Education Details driving with right BKA    Person(s) Educated Patient    Methods Explanation;Verbal cues;Handout    Comprehension Verbalized understanding;Need further instruction;Verbal cues required            PT Short Term Goals - 12/15/19 1448      PT SHORT TERM GOAL #1   Title Patient verbalizes understanding of adjusting ply socks with limb volume changes including with dialysis.  (All STGs Target Date: 01/15/2020)    Time 4    Period Weeks    Status On-going    Target Date 01/15/20      PT SHORT TERM GOAL #2   Title Patient reports back pain increases </= 3 increments on 0-10 scale.    Baseline --    Time 4    Period Weeks     Status New    Target Date 01/15/20      PT SHORT TERM GOAL #3  Title Berg Balance >36/56    Time 4    Period Weeks    Status On-going    Target Date 01/15/20      PT SHORT TERM GOAL #4   Title Patient ambulates 250' with cane & prosthesis with supervision.    Time 4    Period Weeks    Status On-going    Target Date 01/15/20      PT SHORT TERM GOAL #5   Title Patient negotiates ramps & curbs with cane with minimal assist and stairs with 1 rail/cane with supervision with prosthesis.    Time 4    Period Weeks    Status On-going    Target Date 01/15/20             PT Long Term Goals - 12/15/19 1459      PT LONG TERM GOAL #1   Title Patient verbalizes & demonstrates understanding of proper prosthetic care to enable safe utilization of prosthesis.  (All LTGs Target Date: 03/11/2020)    Time 12    Period Weeks    Status On-going    Target Date 03/11/20      PT LONG TERM GOAL #2   Title Patient tolerates prosthesis wear >90% of awake hours without skin issues & limb pain </= 2/10 to enable function throughout his day.    Time 12    Period Weeks    Status On-going    Target Date 03/11/20      PT LONG TERM GOAL #3   Title Berg Balance with prosthesis >/= 45/56 indicating lower fall risk.    Time 12    Period Weeks    Status On-going    Target Date 03/11/20      PT LONG TERM GOAL #4   Title Patient ambulates 500' with cane or less & prosthesis modified independent for community mobility.    Baseline 12/15/2019 not met    Time 12    Period Weeks    Status On-going    Target Date 03/11/20      PT LONG TERM GOAL #5   Title Patient negotiates ramps, curbs & stairs with single rail with cane or less and prosthesis modified independent to enable community access.    Time 12    Period Weeks    Status On-going    Target Date 03/11/20      PT LONG TERM GOAL #6   Title Patient reports back & residual limb pain increases </= 2 increments on 0-10 scale with standing &  gait activities.    Time 12    Period Weeks    Status On-going    Target Date 03/11/20                 Plan - 12/22/19 1350    Clinical Impression Statement PT session worked on prosthetic gait & balance with cane.  PT reviewed adjusting ply socks which patient still requires cueing.  PT discussed options for driving with right BKA.    Personal Factors and Comorbidities Comorbidity 3+;Fitness;Time since onset of injury/illness/exacerbation;Past/Current Experience    Comorbidities Rt TTA, left 2nd & 3rd toe amp 08/19/2018, ESRD, HTN, CHF, aortic stenosis, left fem-pop bypass, peripheral neuropathy, back spasms    Examination-Activity Limitations Lift;Locomotion Level;Stairs;Stand;Transfers    Examination-Participation Restrictions Community Activity;Shop    Stability/Clinical Decision Making Evolving/Moderate complexity    Rehab Potential Good    PT Frequency 2x / week    PT Duration 12 weeks  PT Treatment/Interventions ADLs/Self Care Home Management;Canalith Repostioning;DME Instruction;Gait training;Stair training;Functional mobility training;Therapeutic activities;Therapeutic exercise;Balance training;Neuromuscular re-education;Patient/family education;Prosthetic Training;Manual techniques;Scar mobilization;Passive range of motion;Dry needling;Joint Manipulations;Vestibular    PT Next Visit Plan work towards updated STGs,  progress balance & gait with TTA prosthesis    PT Home Exercise Plan .ampsink, see pt instructions section on 09/24/19 note    Consulted and Agree with Plan of Care Patient           Patient will benefit from skilled therapeutic intervention in order to improve the following deficits and impairments:  Abnormal gait, Decreased activity tolerance, Decreased balance, Decreased endurance, Decreased knowledge of use of DME, Decreased mobility, Decreased range of motion, Decreased skin integrity, Decreased scar mobility, Decreased strength, Dizziness, Increased edema,  Postural dysfunction, Prosthetic Dependency, Pain  Visit Diagnosis: Muscle weakness (generalized)  Other abnormalities of gait and mobility  Unsteadiness on feet  Abnormal posture  Stiffness of right knee, not elsewhere classified  Stiffness of left knee, not elsewhere classified  Chronic bilateral low back pain without sciatica     Problem List Patient Active Problem List   Diagnosis Date Noted  . Neuropathy 10/06/2019  . Gait abnormality 08/06/2019  . Back muscle spasm 07/09/2019  . Sleep disturbance   . Nausea without vomiting   . Orthostatic hypotension   . Acute on chronic anemia   . Chronic combined systolic and diastolic congestive heart failure ([place])   . ESRD on dialysis ([place])   . Right below-knee amputee ([place]) 05/18/2019  . Pressure injury of skin 05/14/2019  . Enterococcal bacteremia 05/13/2019  . Polymorphic ventricular tachycardia ([place]) 10/24/2018  . S/P TAVR (transcatheter aortic valve replacement) 10/21/2018  . Anxiety   . Coronary artery disease involving native coronary artery of native heart without angina pectoris   . Thrombocytopenia ([place])   . PAD (peripheral artery disease) ([place]) 07/22/2018  . Acute on chronic combined systolic and diastolic CHF (congestive heart failure) ([place]) 11/30/2017  . ESRD (end stage renal disease) on dialysis ([place])   . Iron deficiency anemia 11/22/2015  . AL amyloidosis ([place]) 09/25/2014  . Anemia of chronic disease 09/25/2014  . Essential hypertension, benign 11/06/2013    Jamey Reas, PT, DPT 12/22/2019, 2:40 PM  [facility] Therapy [address] [place], [place], [address] Phone: [phone]   Fax:  [phone]  Name: Jose Garrett MRN: [id number] Date of Birth: 1950/07/14

## 2019-12-22 NOTE — Patient Instructions (Signed)
Driving options with Below Knee Prosthesis   Option 1:  Remove prosthesis and use left leg to crossover drive Option 2:  2 Foot driving - right foot on gas & left foot on brake.  Prosthetic motion is leg press motion not ankle pump.  Focus is on heel pressing out not on toes/forefoot.   Option 3: Use prosthesis on both gas & brake.  Continue to focus on heel motion & position.  Option 4: Left foot accelerator.  Consider portable left foot accelerator ( http://plfa.org ) as allows to switch between cars or remove for other drivers.  Left foot both gas & brake.  Using cruise control to accelerate & decelerate or resume speed after stopping can safe some leg work / motion.   Practice initially in large empty parking lot. Stay in middle not near curbs. Practice turning & backing into parking spot on right & left. Parallel park.  Back up.  Quick stops. More about learning foot work not staying in motion.   

## 2019-12-24 ENCOUNTER — Ambulatory Visit (INDEPENDENT_AMBULATORY_CARE_PROVIDER_SITE_OTHER): Payer: Medicare Other | Admitting: Physical Therapy

## 2019-12-24 ENCOUNTER — Other Ambulatory Visit: Payer: Self-pay

## 2019-12-24 ENCOUNTER — Encounter: Payer: Self-pay | Admitting: Physical Therapy

## 2019-12-24 DIAGNOSIS — R2689 Other abnormalities of gait and mobility: Secondary | ICD-10-CM | POA: Diagnosis not present

## 2019-12-24 DIAGNOSIS — R293 Abnormal posture: Secondary | ICD-10-CM | POA: Diagnosis not present

## 2019-12-24 DIAGNOSIS — M25661 Stiffness of right knee, not elsewhere classified: Secondary | ICD-10-CM

## 2019-12-24 DIAGNOSIS — M25662 Stiffness of left knee, not elsewhere classified: Secondary | ICD-10-CM

## 2019-12-24 DIAGNOSIS — M6281 Muscle weakness (generalized): Secondary | ICD-10-CM

## 2019-12-24 DIAGNOSIS — R2681 Unsteadiness on feet: Secondary | ICD-10-CM | POA: Diagnosis not present

## 2019-12-24 DIAGNOSIS — G8929 Other chronic pain: Secondary | ICD-10-CM

## 2019-12-24 DIAGNOSIS — M545 Low back pain, unspecified: Secondary | ICD-10-CM

## 2019-12-24 NOTE — Therapy (Signed)
[facility] Physical Therapy [address] [place], [place], [address] Phone: [phone]   Fax:  [phone]  Physical Therapy Treatment  Patient Details  Name: Jose Garrett MRN: [id number] Date of Birth: 1950-06-05 Referring Provider (PT): Curt Jews, MD   Encounter Date: 12/24/2019   PT End of Session - 12/24/19 1056    Visit Number 17    Number of Visits 39    Date for PT Re-Evaluation 03/11/20    Authorization Type [employer] Medicare    Authorization Time Period $30 co-pay    Progress Note Due on Visit 20    PT Start Time 1059    PT Stop Time 1138    PT Time Calculation (min) 39 min    Equipment Utilized During Treatment Gait belt    Activity Tolerance Patient tolerated treatment well    Behavior During Therapy WFL for tasks assessed/performed           Past Medical History:  Diagnosis Date  . Amyloidosis ([place])   . Anemia of chronic disease   . Anxiety   . CAD (coronary artery disease)    a. cardiac cath on 11/25/17 with occlusion of the RCA which could have been his event but the RCA could not be engaged. The RCA filled distally from left to right collaterals. There were no severe stenoses in the left coronary system. Medical therapy recommended, started on plavix.  . Chronic combined systolic and diastolic CHF (congestive heart failure) ([place])    a. EF dropped to 35-40% with grade 2 DD by echo 11/2017.  [name] DJD (degenerative joint disease), lumbar   . ESRD (end stage renal disease) ([place])    ESRD- HEMO MWF- [employer]  . GERD (gastroesophageal reflux disease)   . Hyperlipidemia   . Hypertension   . Myocardial infarction ([place])   . Neuropathy   . Peripheral vascular disease ([place])   . Pneumonia   . Restless legs   . S/P TAVR (transcatheter aortic valve replacement) 10/21/2018   26 mm Edwards Sapien 3 Ultra transcatheter heart valve placed via percutaneous right transfemoral approach   . Severe aortic stenosis    severe    Past Surgical History:    Procedure Laterality Date  . ABDOMINAL AORTOGRAM W/LOWER EXTREMITY Bilateral 07/16/2018   Procedure: ABDOMINAL AORTOGRAM W/LOWER EXTREMITY;  Surgeon: Wellington Hampshire, MD;  Location: [place] [facility];  Service: Cardiovascular;  Laterality: Bilateral;  . ABDOMINAL AORTOGRAM W/LOWER EXTREMITY N/A 04/09/2019   Procedure: ABDOMINAL AORTOGRAM W/LOWER EXTREMITY - Right Lower;  Surgeon: Waynetta Sandy, MD;  Location: [place] [facility];  Service: Cardiovascular;  Laterality: N/A;  . AMPUTATION Left 08/19/2018   Procedure: AMPUTATION LEFT TOES SECOND AND THIRD;  Surgeon: Waynetta Sandy, MD;  [facility];  Service: Vascular;  Laterality: Left;  . AMPUTATION Right 05/07/2019   Procedure: AMPUTATION BELOW KNEE;  Surgeon: Serafina Mitchell, MD;  Location: [facility] OR;  Service: Vascular;  Laterality: Right;  . AV FISTULA PLACEMENT Right 12/27/2015   Procedure: Right Arm ARTERIOVENOUS (AV) FISTULA CREATION;  Surgeon: Angelia Mould, MD;  Location: [place];  Service: Vascular;  Laterality: Right;  . CATARACT EXTRACTION W/ INTRAOCULAR LENS  IMPLANT, BILATERAL    . COLONOSCOPY    . ERCP W/ SPHICTEROTOMY    . FEMORAL-POPLITEAL BYPASS GRAFT Left 07/22/2018   Procedure: left FEMORAL TO BELOW KNEE POPLITEAL ARTERY BYPASS GREAFT using 24m removable ring propaten gore graft;  Surgeon: CWaynetta Sandy MD;  Location: [place]  Service: Vascular;  Laterality: Left;  . FEMORAL-POPLITEAL BYPASS GRAFT Right 04/10/2019   Procedure: BYPASS GRAFT RIGHT FEMORAL-POPLITEAL ARTERY;  Surgeon: Rosetta Posner, MD;  Location: [place];  Service: Vascular;  Laterality: Right;  . I & D EXTREMITY Right 05/05/2019   Procedure: Revision of  RIGHT  FOOT Amputation;  Surgeon: Waynetta Sandy, MD;  Location: [place];  Service: Vascular;  Laterality: Right;  . INTRAOPERATIVE TRANSTHORACIC ECHOCARDIOGRAM N/A 10/21/2018   Procedure: Intraoperative Transthoracic Echocardiogram;  Surgeon: Sherren Mocha, MD;   Location: [place];  Service: Open Heart Surgery;  Laterality: N/A;  . IR GENERIC HISTORICAL  01/27/2016   IR FLUORO GUIDE CV LINE RIGHT 01/27/2016 Arne Cleveland, MD [facility]  . IR GENERIC HISTORICAL  01/27/2016   IR US GUIDE VASC ACCESS RIGHT 01/27/2016 Arne Cleveland, MD [facility]  . LAPAROSCOPIC CHOLECYSTECTOMY  03/2011  . LEFT HEART CATH AND CORONARY ANGIOGRAPHY N/A 11/25/2017   Procedure: LEFT HEART CATH AND CORONARY ANGIOGRAPHY;  Surgeon: Martinique, Peter M, MD;  Location: [place] [facility];  Service: Cardiovascular;  Laterality: N/A;  . LEFT HEART CATH AND CORONARY ANGIOGRAPHY N/A 07/24/2018   Procedure: LEFT HEART CATH AND CORONARY ANGIOGRAPHY;  Surgeon: Sherren Mocha, MD;  Location: [place] [facility];  Service: Cardiovascular;  Laterality: N/A;  . RENAL BIOPSY, PERCUTANEOUS Right 09/08/2014  . RIGHT/LEFT HEART CATH AND CORONARY ANGIOGRAPHY N/A 03/25/2018   Procedure: RIGHT/LEFT HEART CATH AND CORONARY ANGIOGRAPHY;  Surgeon: Martinique, Peter M, MD;  Location: [place] [facility];  Service: Cardiovascular;  Laterality: N/A;  . TEE WITHOUT CARDIOVERSION N/A 05/14/2019   Procedure: TRANSESOPHAGEAL ECHOCARDIOGRAM (TEE);  Surgeon: Fay Records, MD;  Location: [facility] ENDOSCOPY;  Service: Cardiovascular;  Laterality: N/A;  . TOE AMPUTATION Left 08/19/2018   2nd & 3rd toes  . TONSILLECTOMY  ~ 1960  . TRANSCATHETER AORTIC VALVE REPLACEMENT, TRANSFEMORAL N/A 10/21/2018   Procedure: TRANSCATHETER AORTIC VALVE REPLACEMENT, TRANSFEMORAL;  Surgeon: Sherren Mocha, MD;  Location: [place];  Service: Open Heart Surgery;  Laterality: N/A;  . TRANSMETATARSAL AMPUTATION Right 04/29/2019   Procedure: RIGHT TRANSMETATARSAL AMPUTATION;  Surgeon: Waynetta Sandy, MD;  Location: [place];  Service: Vascular;  Laterality: Right;  . ULTRASOUND GUIDANCE FOR VASCULAR ACCESS  03/25/2018   Procedure: Ultrasound Guidance For Vascular Access;  Surgeon: Martinique, Peter M, MD;  Location: [place] [facility];  Service:  Cardiovascular;;    There were no vitals filed for this visit.   Subjective Assessment - 12/24/19 1057    Subjective No issues since last session.  He was able to tolerate entire dialysis session.    Patient is accompained by: Family member   dtr, Alysia Penna   Pertinent History Rt TTA, left 2nd & 3rd toe amp 08/19/2018, ESRD, HTN, CHF, aortic stenosis, left fem-pop bypass, peripheral neuropathy, back spasms,    Patient Stated Goals use prosthesis to be active in community, working on car, day travels, walks    Currently in Pain? Yes    Pain Score 7     Pain Location Back    Pain Orientation Lower;Mid    Pain Descriptors / Indicators Spasm;Sore;Sharp;Aching    Pain Type Chronic pain    Pain Onset More than a month ago    Pain Frequency Constant    Aggravating Factors  moving or positioning wrong    Pain Relieving Factors injection helped, medications & heat, stretches some times    Pain Onset More than a month ago  Echo Adult PT Treatment/Exercise - 12/24/19 1100      Transfers   Sit to Stand 5: Supervision;With upper extremity assist;With armrests;From chair/3-in-1;Other (comment)    Stand to Sit 5: Supervision;With armrests;To chair/3-in-1;With upper extremity assist;Other (comment)      Ambulation/Gait   Ambulation/Gait Yes    Ambulation/Gait Assistance 4: Min assist    Ambulation/Gait Assistance Details worked on balance reactions scanning while walking    Ambulation Distance (Feet) 300 Feet    Assistive device Straight cane;Prosthesis   stand alone tip on cane   Ambulation Surface Level;Indoor    Ramp 4: Min assist   cane stand alone tip & TTA prosthesis   Curb 4: Min assist   cane stand alone tip & TTA prosthesis     High Level Balance   High Level Balance Activities --    High Level Balance Comments --      Therapeutic Activites    Lifting PT demo & verbal cues on picking items from floor with TTA prosthesis. Pt return  demo 2 reps with UE support on //bars & 2 reps no UE support with contact assist.        Neuro Re-ed    Neuro Re-ed Details  standing with reciprocal UEs & BUEs red theraband rows & forward punch 10 reps ea.       Knee/Hip Exercises: Aerobic   Nustep Level 5 for 8 min with BLEs & BUEs      Prosthetics   Prosthetic Care Comments  PT reviewed adjusting ply socks & changing shoes with same heel height.     Current prosthetic wear tolerance (days/week)  daily    Current prosthetic wear tolerance (#hours/day)  reports most of awake hours. Sleeps after dialysis so decreased time but % is most of awake hours.     Residual limb condition  no wounds or skin issues. normal color, dry distally,     Education Provided Correct ply sock adjustment;Other (comment)   see prosthetic care comments.    Person(s) Educated Patient    Education Method Explanation;Demonstration;Tactile cues;Verbal cues    Education Method Verbalized understanding;Verbal cues required;Needs further instruction    Donning Prosthesis Supervision                    PT Short Term Goals - 12/15/19 1448      PT SHORT TERM GOAL #1   Title Patient verbalizes understanding of adjusting ply socks with limb volume changes including with dialysis.  (All STGs Target Date: 01/15/2020)    Time 4    Period Weeks    Status On-going    Target Date 01/15/20      PT SHORT TERM GOAL #2   Title Patient reports back pain increases </= 3 increments on 0-10 scale.    Baseline --    Time 4    Period Weeks    Status New    Target Date 01/15/20      PT SHORT TERM GOAL #3   Title Berg Balance >36/56    Time 4    Period Weeks    Status On-going    Target Date 01/15/20      PT SHORT TERM GOAL #4   Title Patient ambulates 250' with cane & prosthesis with supervision.    Time 4    Period Weeks    Status On-going    Target Date 01/15/20      PT SHORT TERM GOAL #5   Title Patient negotiates ramps &  curbs with cane with minimal  assist and stairs with 1 rail/cane with supervision with prosthesis.    Time 4    Period Weeks    Status On-going    Target Date 01/15/20             PT Long Term Goals - 12/15/19 1459      PT LONG TERM GOAL #1   Title Patient verbalizes & demonstrates understanding of proper prosthetic care to enable safe utilization of prosthesis.  (All LTGs Target Date: 03/11/2020)    Time 12    Period Weeks    Status On-going    Target Date 03/11/20      PT LONG TERM GOAL #2   Title Patient tolerates prosthesis wear >90% of awake hours without skin issues & limb pain </= 2/10 to enable function throughout his day.    Time 12    Period Weeks    Status On-going    Target Date 03/11/20      PT LONG TERM GOAL #3   Title Berg Balance with prosthesis >/= 45/56 indicating lower fall risk.    Time 12    Period Weeks    Status On-going    Target Date 03/11/20      PT LONG TERM GOAL #4   Title Patient ambulates 500' with cane or less & prosthesis modified independent for community mobility.    Baseline 12/15/2019 not met    Time 12    Period Weeks    Status On-going    Target Date 03/11/20      PT LONG TERM GOAL #5   Title Patient negotiates ramps, curbs & stairs with single rail with cane or less and prosthesis modified independent to enable community access.    Time 12    Period Weeks    Status On-going    Target Date 03/11/20      PT LONG TERM GOAL #6   Title Patient reports back & residual limb pain increases </= 2 increments on 0-10 scale with standing & gait activities.    Time 12    Period Weeks    Status On-going    Target Date 03/11/20                 Plan - 12/24/19 1057    Clinical Impression Statement PT session progressed gait distance and scanning while ambulating.  Standing balance with resistive UE activities.    Personal Factors and Comorbidities Comorbidity 3+;Fitness;Time since onset of injury/illness/exacerbation;Past/Current Experience    Comorbidities  Rt TTA, left 2nd & 3rd toe amp 08/19/2018, ESRD, HTN, CHF, aortic stenosis, left fem-pop bypass, peripheral neuropathy, back spasms    Examination-Activity Limitations Lift;Locomotion Level;Stairs;Stand;Transfers    Examination-Participation Restrictions Community Activity;Shop    Stability/Clinical Decision Making Evolving/Moderate complexity    Rehab Potential Good    PT Frequency 2x / week    PT Duration 12 weeks    PT Treatment/Interventions ADLs/Self Care Home Management;Canalith Repostioning;DME Instruction;Gait training;Stair training;Functional mobility training;Therapeutic activities;Therapeutic exercise;Balance training;Neuromuscular re-education;Patient/family education;Prosthetic Training;Manual techniques;Scar mobilization;Passive range of motion;Dry needling;Joint Manipulations;Vestibular    PT Next Visit Plan work towards updated STGs,  progress balance & gait with TTA prosthesis    PT Home Exercise Plan .ampsink, see pt instructions section on 09/24/19 note    Consulted and Agree with Plan of Care Patient           Patient will benefit from skilled therapeutic intervention in order to improve the following deficits and impairments:  Abnormal  gait, Decreased activity tolerance, Decreased balance, Decreased endurance, Decreased knowledge of use of DME, Decreased mobility, Decreased range of motion, Decreased skin integrity, Decreased scar mobility, Decreased strength, Dizziness, Increased edema, Postural dysfunction, Prosthetic Dependency, Pain  Visit Diagnosis: Muscle weakness (generalized)  Other abnormalities of gait and mobility  Unsteadiness on feet  Abnormal posture  Stiffness of right knee, not elsewhere classified  Stiffness of left knee, not elsewhere classified  Chronic bilateral low back pain without sciatica     Problem List Patient Active Problem List   Diagnosis Date Noted  . Neuropathy 10/06/2019  . Gait abnormality 08/06/2019  . Back muscle spasm  07/09/2019  . Sleep disturbance   . Nausea without vomiting   . Orthostatic hypotension   . Acute on chronic anemia   . Chronic combined systolic and diastolic congestive heart failure ([place])   . ESRD on dialysis ([place])   . Right below-knee amputee ([place]) 05/18/2019  . Pressure injury of skin 05/14/2019  . Enterococcal bacteremia 05/13/2019  . Polymorphic ventricular tachycardia ([place]) 10/24/2018  . S/P TAVR (transcatheter aortic valve replacement) 10/21/2018  . Anxiety   . Coronary artery disease involving native coronary artery of native heart without angina pectoris   . Thrombocytopenia ([place])   . PAD (peripheral artery disease) ([place]) 07/22/2018  . Acute on chronic combined systolic and diastolic CHF (congestive heart failure) ([place]) 11/30/2017  . ESRD (end stage renal disease) on dialysis ([place])   . Iron deficiency anemia 11/22/2015  . AL amyloidosis ([place]) 09/25/2014  . Anemia of chronic disease 09/25/2014  . Essential hypertension, benign 11/06/2013    Jamey Reas PT, DPT 12/24/2019, 3:04 PM  [facility] [address] [place], [place], [address] Phone: [phone]   Fax:  [phone]  Name: Jose Garrett MRN: [id number] Date of Birth: 1950-07-04

## 2019-12-29 ENCOUNTER — Ambulatory Visit (INDEPENDENT_AMBULATORY_CARE_PROVIDER_SITE_OTHER): Payer: Medicare Other | Admitting: Physical Therapy

## 2019-12-29 ENCOUNTER — Encounter: Payer: Self-pay | Admitting: Physical Therapy

## 2019-12-29 ENCOUNTER — Other Ambulatory Visit: Payer: Self-pay

## 2019-12-29 DIAGNOSIS — R293 Abnormal posture: Secondary | ICD-10-CM | POA: Diagnosis not present

## 2019-12-29 DIAGNOSIS — R2689 Other abnormalities of gait and mobility: Secondary | ICD-10-CM | POA: Diagnosis not present

## 2019-12-29 DIAGNOSIS — G8929 Other chronic pain: Secondary | ICD-10-CM

## 2019-12-29 DIAGNOSIS — M6281 Muscle weakness (generalized): Secondary | ICD-10-CM | POA: Diagnosis not present

## 2019-12-29 DIAGNOSIS — M25662 Stiffness of left knee, not elsewhere classified: Secondary | ICD-10-CM

## 2019-12-29 DIAGNOSIS — R2681 Unsteadiness on feet: Secondary | ICD-10-CM | POA: Diagnosis not present

## 2019-12-29 DIAGNOSIS — M545 Low back pain, unspecified: Secondary | ICD-10-CM

## 2019-12-29 DIAGNOSIS — M25661 Stiffness of right knee, not elsewhere classified: Secondary | ICD-10-CM

## 2019-12-29 NOTE — Patient Instructions (Signed)
Access Code: [email or web address] URL: [email or web address] Date: 12/29/2019 Prepared by: Jamey Reas  Exercises Seated Hamstring Stretch - 2 x daily - 7 x weekly - 1 sets - 3 reps - 30 seconds hold Seated Gastroc Stretch with Strap - 2 x daily - 7 x weekly - 1 sets - 3 reps - 30 seconds hold Seated Hamstring Stretch with Strap - 2 x daily - 7 x weekly - 1 sets - 3 reps - 30 seconds hold

## 2019-12-29 NOTE — Therapy (Signed)
[facility] Physical Therapy [address] [place], [place], [address] Phone: [phone]   Fax:  [phone]  Physical Therapy Treatment  Patient Details  Name: Jose Garrett MRN: [id number] Date of Birth: 10/11/1950 Referring Provider (PT): Curt Jews, MD   Encounter Date: 12/29/2019   PT End of Session - 12/29/19 1337    Visit Number 18    Number of Visits 39    Date for PT Re-Evaluation 03/11/20    Authorization Type [employer] Medicare    Authorization Time Period $30 co-pay    Progress Note Due on Visit 20    PT Start Time 1337    PT Stop Time 1420    PT Time Calculation (min) 43 min    Equipment Utilized During Treatment Gait belt    Activity Tolerance Patient tolerated treatment well    Behavior During Therapy WFL for tasks assessed/performed           Past Medical History:  Diagnosis Date  . Amyloidosis ([place])   . Anemia of chronic disease   . Anxiety   . CAD (coronary artery disease)    a. cardiac cath on 11/25/17 with occlusion of the RCA which could have been his event but the RCA could not be engaged. The RCA filled distally from left to right collaterals. There were no severe stenoses in the left coronary system. Medical therapy recommended, started on plavix.  . Chronic combined systolic and diastolic CHF (congestive heart failure) ([place])    a. EF dropped to 35-40% with grade 2 DD by echo 11/2017.  [name] DJD (degenerative joint disease), lumbar   . ESRD (end stage renal disease) ([place])    ESRD- HEMO MWF- [employer]  . GERD (gastroesophageal reflux disease)   . Hyperlipidemia   . Hypertension   . Myocardial infarction ([place])   . Neuropathy   . Peripheral vascular disease ([place])   . Pneumonia   . Restless legs   . S/P TAVR (transcatheter aortic valve replacement) 10/21/2018   26 mm Edwards Sapien 3 Ultra transcatheter heart valve placed via percutaneous right transfemoral approach   . Severe aortic stenosis    severe    Past Surgical History:    Procedure Laterality Date  . ABDOMINAL AORTOGRAM W/LOWER EXTREMITY Bilateral 07/16/2018   Procedure: ABDOMINAL AORTOGRAM W/LOWER EXTREMITY;  Surgeon: Wellington Hampshire, MD;  Location: [place] [facility];  Service: Cardiovascular;  Laterality: Bilateral;  . ABDOMINAL AORTOGRAM W/LOWER EXTREMITY N/A 04/09/2019   Procedure: ABDOMINAL AORTOGRAM W/LOWER EXTREMITY - Right Lower;  Surgeon: Waynetta Sandy, MD;  Location: [place] [facility];  Service: Cardiovascular;  Laterality: N/A;  . AMPUTATION Left 08/19/2018   Procedure: AMPUTATION LEFT TOES SECOND AND THIRD;  Surgeon: Waynetta Sandy, MD;  Location: [place];  Service: Vascular;  Laterality: Left;  . AMPUTATION Right 05/07/2019   Procedure: AMPUTATION BELOW KNEE;  Surgeon: Serafina Mitchell, MD;  Location: [facility] OR;  Service: Vascular;  Laterality: Right;  . AV FISTULA PLACEMENT Right 12/27/2015   Procedure: Right Arm ARTERIOVENOUS (AV) FISTULA CREATION;  Surgeon: Angelia Mould, MD;  Location: [place];  Service: Vascular;  Laterality: Right;  . CATARACT EXTRACTION W/ INTRAOCULAR LENS  IMPLANT, BILATERAL    . COLONOSCOPY    . ERCP W/ SPHICTEROTOMY    . FEMORAL-POPLITEAL BYPASS GRAFT Left 07/22/2018   Procedure: left FEMORAL TO BELOW KNEE POPLITEAL ARTERY BYPASS GREAFT using 32mm removable ring propaten gore graft;  Surgeon: Waynetta Sandy, MD;  Location: [place];  Service: Vascular;  Laterality: Left;  . FEMORAL-POPLITEAL BYPASS GRAFT Right 04/10/2019   Procedure: BYPASS GRAFT RIGHT FEMORAL-POPLITEAL ARTERY;  Surgeon: Rosetta Posner, MD;  Location: [place];  Service: Vascular;  Laterality: Right;  . I & D EXTREMITY Right 05/05/2019   Procedure: Revision of  RIGHT  FOOT Amputation;  Surgeon: Waynetta Sandy, MD;  Location: [place];  Service: Vascular;  Laterality: Right;  . INTRAOPERATIVE TRANSTHORACIC ECHOCARDIOGRAM N/A 10/21/2018   Procedure: Intraoperative Transthoracic Echocardiogram;  Surgeon: Sherren Mocha, MD;   Location: [place];  Service: Open Heart Surgery;  Laterality: N/A;  . IR GENERIC HISTORICAL  01/27/2016   IR FLUORO GUIDE CV LINE RIGHT 01/27/2016 Arne [place], MD [facility]INTER[facility]  . IR GENERIC HISTORICAL  01/27/2016   IR US GUIDE VASC ACCESS RIGHT 01/27/2016 Arne Cleveland, MD [facility]  . LAPAROSCOPIC CHOLECYSTECTOMY  03/2011  . LEFT HEART CATH AND CORONARY ANGIOGRAPHY N/A 11/25/2017   Procedure: LEFT HEART CATH AND CORONARY ANGIOGRAPHY;  Surgeon: Martinique, Peter M, MD;  Location: [place] [facility];  Service: Cardiovascular;  Laterality: N/A;  . LEFT HEART CATH AND CORONARY ANGIOGRAPHY N/A 07/24/2018   Procedure: LEFT HEART CATH AND CORONARY ANGIOGRAPHY;  Surgeon: Sherren Mocha, MD;  Location: [place] [facility];  Service: Cardiovascular;  Laterality: N/A;  . RENAL BIOPSY, PERCUTANEOUS Right 09/08/2014  . RIGHT/LEFT HEART CATH AND CORONARY ANGIOGRAPHY N/A 03/25/2018   Procedure: RIGHT/LEFT HEART CATH AND CORONARY ANGIOGRAPHY;  Surgeon: Martinique, Peter M, MD;  Location: [place] [facility];  Service: Cardiovascular;  Laterality: N/A;  . TEE WITHOUT CARDIOVERSION N/A 05/14/2019   Procedure: TRANSESOPHAGEAL ECHOCARDIOGRAM (TEE);  Surgeon: Fay Records, MD;  Location: [facility] ENDOSCOPY;  Service: Cardiovascular;  Laterality: N/A;  . TOE AMPUTATION Left 08/19/2018   2nd & 3rd toes  . TONSILLECTOMY  ~ 1960  . TRANSCATHETER AORTIC VALVE REPLACEMENT, TRANSFEMORAL N/A 10/21/2018   Procedure: TRANSCATHETER AORTIC VALVE REPLACEMENT, TRANSFEMORAL;  Surgeon: Sherren Mocha, MD;  Location: [place];  Service: Open Heart Surgery;  Laterality: N/A;  . TRANSMETATARSAL AMPUTATION Right 04/29/2019   Procedure: RIGHT TRANSMETATARSAL AMPUTATION;  Surgeon: Waynetta Sandy, MD;  Location: [place];  Service: Vascular;  Laterality: Right;  . ULTRASOUND GUIDANCE FOR VASCULAR ACCESS  03/25/2018   Procedure: Ultrasound Guidance For Vascular Access;  Surgeon: Martinique, Peter M, MD;  Location: [place] [facility];  Service:  Cardiovascular;;    There were no vitals filed for this visit.   Subjective Assessment - 12/29/19 1337    Subjective No falls. He is wearing prosthesis during dialysis without issues.  He has appointment with prosthesist on Thursday.    Patient is accompained by: Family member   dtr, Alysia Penna   Pertinent History Rt TTA, left 2nd & 3rd toe amp 08/19/2018, ESRD, HTN, CHF, aortic stenosis, left fem-pop bypass, peripheral neuropathy, back spasms,    Patient Stated Goals use prosthesis to be active in community, working on car, day travels, walks    Currently in Pain? Yes    Pain Score 6     Pain Location Back    Pain Orientation Lower;Mid    Pain Descriptors / Indicators Spasm;Aching    Pain Type Chronic pain    Pain Onset More than a month ago    Pain Frequency Constant    Aggravating Factors  standing & walking, position too long    Pain Relieving Factors injections, medications    Pain Onset More than a month ago  [place] Adult PT Treatment/Exercise - 12/29/19 1337      Transfers   Sit to Stand 5: Supervision;With upper extremity assist;With armrests;From chair/3-in-1;Other (comment)    Stand to Sit 5: Supervision;With armrests;To chair/3-in-1;With upper extremity assist;Other (comment)      Ambulation/Gait   Ambulation/Gait Yes    Ambulation/Gait Assistance 4: Min assist   min guard   Ambulation/Gait Assistance Details cues on upright posture & wt shift.     Ambulation Distance (Feet) 200 Feet    Assistive device Straight cane;Prosthesis   stand alone tip on cane   Ambulation Surface Level;Indoor    Ramp 4: Min assist   cane stand alone tip & TTA prosthesis   Ramp Details (indicate cue type and reason) cues on technique    Curb 4: Min assist   cane stand alone tip & TTA prosthesis   Curb Details (indicate cue type and reason) cues on technique & sequence.       Therapeutic Activites    Lifting --      Neuro Re-ed    Neuro Re-ed  Details  --      Lumbar Exercises: Stretches   Active Hamstring Stretch Right;Left;3 reps;20 seconds   1st 2 reps seated forward lean 3rd rep combo w/gastroc    Active Hamstring Stretch Limitations seated knee extended forward lean    Gastroc Stretch Right;Left;2 reps;20 seconds    Gastroc Stretch Limitations seated w/leg extended and cane to DF ankle including prosthetic side to stretch remaining gastroc which effects knee      Knee/Hip Exercises: Aerobic   Nustep Level 6 for 8 min with BLEs & BUEs      Knee/Hip Exercises: Machines for Strengthening   Cybex Leg Press Shuttle leg press 56# BLEs 10 reps 2 sets      Prosthetics   Prosthetic Care Comments  --    Current prosthetic wear tolerance (days/week)  daily    Current prosthetic wear tolerance (#hours/day)  reports most of awake hours. Sleeps after dialysis so decreased time but % is most of awake hours.     Residual limb condition  no wounds or skin issues. normal color, dry distally,     Education Provided --                    PT Short Term Goals - 12/15/19 1448      PT SHORT TERM GOAL #1   Title Patient verbalizes understanding of adjusting ply socks with limb volume changes including with dialysis.  (All STGs Target Date: 01/15/2020)    Time 4    Period Weeks    Status On-going    Target Date 01/15/20      PT SHORT TERM GOAL #2   Title Patient reports back pain increases </= 3 increments on 0-10 scale.    Baseline --    Time 4    Period Weeks    Status New    Target Date 01/15/20      PT SHORT TERM GOAL #3   Title Berg Balance >36/56    Time 4    Period Weeks    Status On-going    Target Date 01/15/20      PT SHORT TERM GOAL #4   Title Patient ambulates 250' with cane & prosthesis with supervision.    Time 4    Period Weeks    Status On-going    Target Date 01/15/20      PT SHORT TERM GOAL #5  Title Patient negotiates ramps & curbs with cane with minimal assist and stairs with 1 rail/cane with  supervision with prosthesis.    Time 4    Period Weeks    Status On-going    Target Date 01/15/20             PT Long Term Goals - 12/15/19 1459      PT LONG TERM GOAL #1   Title Patient verbalizes & demonstrates understanding of proper prosthetic care to enable safe utilization of prosthesis.  (All LTGs Target Date: 03/11/2020)    Time 12    Period Weeks    Status On-going    Target Date 03/11/20      PT LONG TERM GOAL #2   Title Patient tolerates prosthesis wear >90% of awake hours without skin issues & limb pain </= 2/10 to enable function throughout his day.    Time 12    Period Weeks    Status On-going    Target Date 03/11/20      PT LONG TERM GOAL #3   Title Berg Balance with prosthesis >/= 45/56 indicating lower fall risk.    Time 12    Period Weeks    Status On-going    Target Date 03/11/20      PT LONG TERM GOAL #4   Title Patient ambulates 500' with cane or less & prosthesis modified independent for community mobility.    Baseline 12/15/2019 not met    Time 12    Period Weeks    Status On-going    Target Date 03/11/20      PT LONG TERM GOAL #5   Title Patient negotiates ramps, curbs & stairs with single rail with cane or less and prosthesis modified independent to enable community access.    Time 12    Period Weeks    Status On-going    Target Date 03/11/20      PT LONG TERM GOAL #6   Title Patient reports back & residual limb pain increases </= 2 increments on 0-10 scale with standing & gait activities.    Time 12    Period Weeks    Status On-going    Target Date 03/11/20                 Plan - 12/29/19 1337    Clinical Impression Statement Patient has tightness in bilateral hamstrings & gastrocs impacting function. PT instructed in seated stretches & he appears to understand.  Patient's gait with cane seemed improved with balance today. He is on target to meet STGs.    Personal Factors and Comorbidities Comorbidity 3+;Fitness;Time since  onset of injury/illness/exacerbation;Past/Current Experience    Comorbidities Rt TTA, left 2nd & 3rd toe amp 08/19/2018, ESRD, HTN, CHF, aortic stenosis, left fem-pop bypass, peripheral neuropathy, back spasms    Examination-Activity Limitations Lift;Locomotion Level;Stairs;Stand;Transfers    Examination-Participation Restrictions Community Activity;Shop    Stability/Clinical Decision Making Evolving/Moderate complexity    Rehab Potential Good    PT Frequency 2x / week    PT Duration 12 weeks    PT Treatment/Interventions ADLs/Self Care Home Management;Canalith Repostioning;DME Instruction;Gait training;Stair training;Functional mobility training;Therapeutic activities;Therapeutic exercise;Balance training;Neuromuscular re-education;Patient/family education;Prosthetic Training;Manual techniques;Scar mobilization;Passive range of motion;Dry needling;Joint Manipulations;Vestibular    PT Next Visit Plan work towards updated STGs,  progress balance & gait with TTA prosthesis    PT Home Exercise Plan .ampsink, see pt instructions section on 09/24/19 note  Access Code: [email or web address]    Consulted and Agree with Plan of  Care Patient           Patient will benefit from skilled therapeutic intervention in order to improve the following deficits and impairments:  Abnormal gait, Decreased activity tolerance, Decreased balance, Decreased endurance, Decreased knowledge of use of DME, Decreased mobility, Decreased range of motion, Decreased skin integrity, Decreased scar mobility, Decreased strength, Dizziness, Increased edema, Postural dysfunction, Prosthetic Dependency, Pain  Visit Diagnosis: Muscle weakness (generalized)  Other abnormalities of gait and mobility  Unsteadiness on feet  Abnormal posture  Stiffness of right knee, not elsewhere classified  Stiffness of left knee, not elsewhere classified  Chronic bilateral low back pain without sciatica     Problem List Patient Active Problem List    Diagnosis Date Noted  . Neuropathy 10/06/2019  . Gait abnormality 08/06/2019  . Back muscle spasm 07/09/2019  . Sleep disturbance   . Nausea without vomiting   . Orthostatic hypotension   . Acute on chronic anemia   . Chronic combined systolic and diastolic congestive heart failure ([place])   . ESRD on dialysis ([place])   . Right below-knee amputee ([place]) 05/18/2019  . Pressure injury of skin 05/14/2019  . Enterococcal bacteremia 05/13/2019  . Polymorphic ventricular tachycardia ([place]) 10/24/2018  . S/P TAVR (transcatheter aortic valve replacement) 10/21/2018  . Anxiety   . Coronary artery disease involving native coronary artery of native heart without angina pectoris   . Thrombocytopenia ([place])   . PAD (peripheral artery disease) ([place]) 07/22/2018  . Acute on chronic combined systolic and diastolic CHF (congestive heart failure) ([place]) 11/30/2017  . ESRD (end stage renal disease) on dialysis ([place])   . Iron deficiency anemia 11/22/2015  . AL amyloidosis ([place]) 09/25/2014  . Anemia of chronic disease 09/25/2014  . Essential hypertension, benign 11/06/2013    Jamey Reas, PT, DPT 12/29/2019, 2:26 PM  [facility] Physical Therapy [address] [place], [place], [address] Phone: [phone]   Fax:  [phone]  Name: EDWAR COE MRN: [id number] Date of Birth: 11-18-1950

## 2019-12-31 ENCOUNTER — Other Ambulatory Visit: Payer: Self-pay

## 2019-12-31 ENCOUNTER — Ambulatory Visit (INDEPENDENT_AMBULATORY_CARE_PROVIDER_SITE_OTHER): Payer: Medicare Other | Admitting: Physical Therapy

## 2019-12-31 DIAGNOSIS — M6281 Muscle weakness (generalized): Secondary | ICD-10-CM | POA: Diagnosis not present

## 2019-12-31 DIAGNOSIS — R2689 Other abnormalities of gait and mobility: Secondary | ICD-10-CM

## 2019-12-31 DIAGNOSIS — G8929 Other chronic pain: Secondary | ICD-10-CM

## 2019-12-31 DIAGNOSIS — M25662 Stiffness of left knee, not elsewhere classified: Secondary | ICD-10-CM

## 2019-12-31 DIAGNOSIS — M545 Low back pain, unspecified: Secondary | ICD-10-CM

## 2019-12-31 DIAGNOSIS — R2681 Unsteadiness on feet: Secondary | ICD-10-CM

## 2019-12-31 DIAGNOSIS — R293 Abnormal posture: Secondary | ICD-10-CM | POA: Diagnosis not present

## 2019-12-31 DIAGNOSIS — M25661 Stiffness of right knee, not elsewhere classified: Secondary | ICD-10-CM

## 2019-12-31 NOTE — Therapy (Signed)
[facility] Physical Therapy [address] [place], [place], [address] Phone: [phone]   Fax:  [phone]  Physical Therapy Treatment  Patient Details  Name: Jose Garrett MRN: [id number] Date of Birth: 1950/06/05 Referring Provider (PT): Curt Jews, MD   Encounter Date: 12/31/2019   PT End of Session - 12/31/19 1151    Visit Number 19    Number of Visits 39    Date for PT Re-Evaluation 03/11/20    Authorization Type [employer] Medicare    Authorization Time Period $30 co-pay    Authorization - Number of Visits 12    Progress Note Due on Visit 20    PT Start Time 1056    PT Stop Time 1134    PT Time Calculation (min) 38 min    Equipment Utilized During Treatment Gait belt    Activity Tolerance Patient tolerated treatment well    Behavior During Therapy WFL for tasks assessed/performed           Past Medical History:  Diagnosis Date  . Amyloidosis ([place])   . Anemia of chronic disease   . Anxiety   . CAD (coronary artery disease)    a. cardiac cath on 11/25/17 with occlusion of the RCA which could have been his event but the RCA could not be engaged. The RCA filled distally from left to right collaterals. There were no severe stenoses in the left coronary system. Medical therapy recommended, started on plavix.  . Chronic combined systolic and diastolic CHF (congestive heart failure) ([place])    a. EF dropped to 35-40% with grade 2 DD by echo 11/2017.  [name] DJD (degenerative joint disease), lumbar   . ESRD (end stage renal disease) ([place])    ESRD- HEMO MWF- [employer] [employer]  . GERD (gastroesophageal reflux disease)   . Hyperlipidemia   . Hypertension   . Myocardial infarction ([place])   . Neuropathy   . Peripheral vascular disease ([place])   . Pneumonia   . Restless legs   . S/P TAVR (transcatheter aortic valve replacement) 10/21/2018   26 mm Edwards Sapien 3 Ultra transcatheter heart valve placed via percutaneous right transfemoral approach   . Severe aortic stenosis     severe    Past Surgical History:  Procedure Laterality Date  . ABDOMINAL AORTOGRAM W/LOWER EXTREMITY Bilateral 07/16/2018   Procedure: ABDOMINAL AORTOGRAM W/LOWER EXTREMITY;  Surgeon: Wellington Hampshire, MD;  Location: [facility];  Service: Cardiovascular;  Laterality: Bilateral;  . ABDOMINAL AORTOGRAM W/LOWER EXTREMITY N/A 04/09/2019   Procedure: ABDOMINAL AORTOGRAM W/LOWER EXTREMITY - Right Lower;  Surgeon: Waynetta Sandy, MD;  Location: [facility];  Service: Cardiovascular;  Laterality: N/A;  . AMPUTATION Left 08/19/2018   Procedure: AMPUTATION LEFT TOES SECOND AND THIRD;  Surgeon: Waynetta Sandy, MD;  Location: [facility];  Service: Vascular;  Laterality: Left;  . AMPUTATION Right 05/07/2019   Procedure: AMPUTATION BELOW KNEE;  Surgeon: Serafina Mitchell, MD;  Location: [facility] OR;  Service: Vascular;  Laterality: Right;  . AV FISTULA PLACEMENT Right 12/27/2015   Procedure: Right Arm ARTERIOVENOUS (AV) FISTULA CREATION;  Surgeon: Angelia Mould, MD;  Location: [facility];  Service: Vascular;  Laterality: Right;  . CATARACT EXTRACTION W/ INTRAOCULAR LENS  IMPLANT, BILATERAL    . COLONOSCOPY    . ERCP W/ SPHICTEROTOMY    . FEMORAL-POPLITEAL BYPASS GRAFT Left 07/22/2018   Procedure: left FEMORAL TO BELOW KNEE POPLITEAL ARTERY BYPASS GREAFT using 39mm removable ring propaten gore graft;  Surgeon: Donzetta Matters,  Dennard Schaumann, MD;  Location: [facility] OR;  Service: Vascular;  Laterality: Left;  . FEMORAL-POPLITEAL BYPASS GRAFT Right 04/10/2019   Procedure: BYPASS GRAFT RIGHT FEMORAL-POPLITEAL ARTERY;  Surgeon: Larina Earthly, MD;  Location: [facility] OR;  Service: Vascular;  Laterality: Right;  . I & D EXTREMITY Right 05/05/2019   Procedure: Revision of  RIGHT  FOOT Amputation;  Surgeon: Maeola Harman, MD;  Location: [facility] OR;  Service: Vascular;  Laterality: Right;  . INTRAOPERATIVE TRANSTHORACIC ECHOCARDIOGRAM N/A 10/21/2018   Procedure: Intraoperative Transthoracic  Echocardiogram;  Surgeon: Tonny Bollman, MD;  Location: [facility] OR;  Service: Open Heart Surgery;  Laterality: N/A;  . IR GENERIC HISTORICAL  01/27/2016   IR FLUORO GUIDE CV LINE RIGHT 01/27/2016 Oley Balm, MD [facility]-INTERV RAD  . IR GENERIC HISTORICAL  01/27/2016   IR US GUIDE VASC ACCESS RIGHT 01/27/2016 Oley Balm, MD [facility]  . LAPAROSCOPIC CHOLECYSTECTOMY  03/2011  . LEFT HEART CATH AND CORONARY ANGIOGRAPHY N/A 11/25/2017   Procedure: LEFT HEART CATH AND CORONARY ANGIOGRAPHY;  Surgeon: Swaziland, Peter M, MD;  Location: [facility] INVASIVE CV LAB;  Service: Cardiovascular;  Laterality: N/A;  . LEFT HEART CATH AND CORONARY ANGIOGRAPHY N/A 07/24/2018   Procedure: LEFT HEART CATH AND CORONARY ANGIOGRAPHY;  Surgeon: Tonny Bollman, MD;  Location: [facility] INVASIVE CV LAB;  Service: Cardiovascular;  Laterality: N/A;  . RENAL BIOPSY, PERCUTANEOUS Right 09/08/2014  . RIGHT/LEFT HEART CATH AND CORONARY ANGIOGRAPHY N/A 03/25/2018   Procedure: RIGHT/LEFT HEART CATH AND CORONARY ANGIOGRAPHY;  Surgeon: Swaziland, Peter M, MD;  Location: [facility] INVASIVE CV LAB;  Service: Cardiovascular;  Laterality: N/A;  . TEE WITHOUT CARDIOVERSION N/A 05/14/2019   Procedure: TRANSESOPHAGEAL ECHOCARDIOGRAM (TEE);  Surgeon: Pricilla Riffle, MD;  Location: [facility] ENDOSCOPY;  Service: Cardiovascular;  Laterality: N/A;  . TOE AMPUTATION Left 08/19/2018   2nd & 3rd toes  . TONSILLECTOMY  ~ 1960  . TRANSCATHETER AORTIC VALVE REPLACEMENT, TRANSFEMORAL N/A 10/21/2018   Procedure: TRANSCATHETER AORTIC VALVE REPLACEMENT, TRANSFEMORAL;  Surgeon: Tonny Bollman, MD;  Location: [facility] OR;  Service: Open Heart Surgery;  Laterality: N/A;  . TRANSMETATARSAL AMPUTATION Right 04/29/2019   Procedure: RIGHT TRANSMETATARSAL AMPUTATION;  Surgeon: Maeola Harman, MD;  Location: [facility] OR;  Service: Vascular;  Laterality: Right;  . ULTRASOUND GUIDANCE FOR VASCULAR ACCESS  03/25/2018   Procedure: Ultrasound Guidance For Vascular Access;  Surgeon: Swaziland, Peter M, MD;   Location: [facility] INVASIVE CV LAB;  Service: Cardiovascular;;    There were no vitals filed for this visit.   Subjective Assessment - 12/31/19 1115    Subjective relays some mild back spasms and his Rt knee is sore today. He will see prosthetist today for adjustment    Patient is accompained by: Family member   dtr, Mingo Amber   Pertinent History Rt TTA, left 2nd & 3rd toe amp 08/19/2018, ESRD, HTN, CHF, aortic stenosis, left fem-pop bypass, peripheral neuropathy, back spasms,    Patient Stated Goals use prosthesis to be active in community, working on car, day travels, walks    Pain Onset More than a month ago    Pain Onset More than a month ago                [facility] Adult PT Treatment/Exercise - 12/31/19 0001      Transfers   Sit to Stand 5: Supervision;With upper extremity assist;With armrests;From chair/3-in-1;Other (comment)    Stand to Sit 5: Supervision;With armrests;To chair/3-in-1;With upper extremity assist;Other (comment)      Ambulation/Gait   Ambulation/Gait Yes  Ambulation/Gait Assistance 4: Min guard    Ambulation Distance (Feet) 220 Feet    Assistive device Straight cane;Prosthesis    Ramp 4: Min assist    Ramp Details (indicate cue type and reason) cues and demo for proper technique and sequencing    Curb 4: Min assist      Lumbar Exercises: Stretches   Active Hamstring Stretch Right;Left;3 reps;30 seconds    Active Hamstring Stretch Limitations seated knee extended forward lean    Gastroc Stretch Right;Left;3 reps;30 seconds    Gastroc Stretch Limitations seated w/leg extended and cane to DF ankle including prosthetic side to stretch remaining gastroc which effects knee      Knee/Hip Exercises: Aerobic   Nustep Level 6 for 8 min with BLEs & BUEs                    PT Short Term Goals - 12/15/19 1448      PT SHORT TERM GOAL #1   Title Patient verbalizes understanding of adjusting ply socks with limb volume changes including with dialysis.  (All  STGs Target Date: 01/15/2020)    Time 4    Period Weeks    Status On-going    Target Date 01/15/20      PT SHORT TERM GOAL #2   Title Patient reports back pain increases </= 3 increments on 0-10 scale.    Baseline --    Time 4    Period Weeks    Status New    Target Date 01/15/20      PT SHORT TERM GOAL #3   Title Berg Balance >36/56    Time 4    Period Weeks    Status On-going    Target Date 01/15/20      PT SHORT TERM GOAL #4   Title Patient ambulates 250' with cane & prosthesis with supervision.    Time 4    Period Weeks    Status On-going    Target Date 01/15/20      PT SHORT TERM GOAL #5   Title Patient negotiates ramps & curbs with cane with minimal assist and stairs with 1 rail/cane with supervision with prosthesis.    Time 4    Period Weeks    Status On-going    Target Date 01/15/20             PT Long Term Goals - 12/15/19 1459      PT LONG TERM GOAL #1   Title Patient verbalizes & demonstrates understanding of proper prosthetic care to enable safe utilization of prosthesis.  (All LTGs Target Date: 03/11/2020)    Time 12    Period Weeks    Status On-going    Target Date 03/11/20      PT LONG TERM GOAL #2   Title Patient tolerates prosthesis wear >90% of awake hours without skin issues & limb pain </= 2/10 to enable function throughout his day.    Time 12    Period Weeks    Status On-going    Target Date 03/11/20      PT LONG TERM GOAL #3   Title Berg Balance with prosthesis >/= 45/56 indicating lower fall risk.    Time 12    Period Weeks    Status On-going    Target Date 03/11/20      PT LONG TERM GOAL #4   Title Patient ambulates 500' with cane or less & prosthesis modified independent for community mobility.  Baseline 12/15/2019 not met    Time 12    Period Weeks    Status On-going    Target Date 03/11/20      PT LONG TERM GOAL #5   Title Patient negotiates ramps, curbs & stairs with single rail with cane or less and prosthesis  modified independent to enable community access.    Time 12    Period Weeks    Status On-going    Target Date 03/11/20      PT LONG TERM GOAL #6   Title Patient reports back & residual limb pain increases </= 2 increments on 0-10 scale with standing & gait activities.    Time 12    Period Weeks    Status On-going    Target Date 03/11/20                 Plan - 12/31/19 1153    Clinical Impression Statement He had more pressure in his anterior knee which limited session some. Did add socks which helped some but stilll limited his standing tolerance. He will see prosthetist at 3 today about possible adjustments.    Personal Factors and Comorbidities Comorbidity 3+;Fitness;Time since onset of injury/illness/exacerbation;Past/Current Experience    Comorbidities Rt TTA, left 2nd & 3rd toe amp 08/19/2018, ESRD, HTN, CHF, aortic stenosis, left fem-pop bypass, peripheral neuropathy, back spasms    Examination-Activity Limitations Lift;Locomotion Level;Stairs;Stand;Transfers    Examination-Participation Restrictions Community Activity;Shop    Stability/Clinical Decision Making Evolving/Moderate complexity    Rehab Potential Good    PT Frequency 2x / week    PT Duration 12 weeks    PT Treatment/Interventions ADLs/Self Care Home Management;Canalith Repostioning;DME Instruction;Gait training;Stair training;Functional mobility training;Therapeutic activities;Therapeutic exercise;Balance training;Neuromuscular re-education;Patient/family education;Prosthetic Training;Manual techniques;Scar mobilization;Passive range of motion;Dry needling;Joint Manipulations;Vestibular    PT Next Visit Plan work towards updated STGs,  progress balance & gait with TTA prosthesis    PT Home Exercise Plan .ampsink, see pt instructions section on 09/24/19 note  Access Code: [email or web address]    Consulted and Agree with Plan of Care Patient           Patient will benefit from skilled therapeutic intervention in order to  improve the following deficits and impairments:  Abnormal gait, Decreased activity tolerance, Decreased balance, Decreased endurance, Decreased knowledge of use of DME, Decreased mobility, Decreased range of motion, Decreased skin integrity, Decreased scar mobility, Decreased strength, Dizziness, Increased edema, Postural dysfunction, Prosthetic Dependency, Pain  Visit Diagnosis: Muscle weakness (generalized)  Other abnormalities of gait and mobility  Unsteadiness on feet  Abnormal posture  Stiffness of right knee, not elsewhere classified  Stiffness of left knee, not elsewhere classified  Chronic bilateral low back pain without sciatica     Problem List Patient Active Problem List   Diagnosis Date Noted  . Neuropathy 10/06/2019  . Gait abnormality 08/06/2019  . Back muscle spasm 07/09/2019  . Sleep disturbance   . Nausea without vomiting   . Orthostatic hypotension   . Acute on chronic anemia   . Chronic combined systolic and diastolic congestive heart failure ([place])   . ESRD on dialysis ([place])   . Right below-knee amputee ([place]) 05/18/2019  . Pressure injury of skin 05/14/2019  . Enterococcal bacteremia 05/13/2019  . Polymorphic ventricular tachycardia ([place]) 10/24/2018  . S/P TAVR (transcatheter aortic valve replacement) 10/21/2018  . Anxiety   . Coronary artery disease involving native coronary artery of native heart without angina pectoris   . Thrombocytopenia ([place])   . PAD (peripheral artery disease) (  [place]) 07/22/2018  . Acute on chronic combined systolic and diastolic CHF (congestive heart failure) ([place]) 11/30/2017  . ESRD (end stage renal disease) on dialysis ([place])   . Iron deficiency anemia 11/22/2015  . AL amyloidosis ([place]) 09/25/2014  . Anemia of chronic disease 09/25/2014  . Essential hypertension, benign 11/06/2013    Jose Garrett 12/31/2019, 11:55 AM  [facility] Physical Therapy [address] [place], [place], [address] Phone:  [phone]   Fax:  [phone]  Name: Jose Garrett MRN: [id number] Date of Birth: May 16, 1950

## 2020-01-07 ENCOUNTER — Encounter: Payer: Self-pay | Admitting: Physical Therapy

## 2020-01-07 ENCOUNTER — Ambulatory Visit (INDEPENDENT_AMBULATORY_CARE_PROVIDER_SITE_OTHER): Payer: Medicare Other | Admitting: Physical Therapy

## 2020-01-07 ENCOUNTER — Other Ambulatory Visit: Payer: Self-pay

## 2020-01-07 DIAGNOSIS — G8929 Other chronic pain: Secondary | ICD-10-CM

## 2020-01-07 DIAGNOSIS — R293 Abnormal posture: Secondary | ICD-10-CM | POA: Diagnosis not present

## 2020-01-07 DIAGNOSIS — M25661 Stiffness of right knee, not elsewhere classified: Secondary | ICD-10-CM

## 2020-01-07 DIAGNOSIS — M545 Low back pain, unspecified: Secondary | ICD-10-CM

## 2020-01-07 DIAGNOSIS — M6281 Muscle weakness (generalized): Secondary | ICD-10-CM | POA: Diagnosis not present

## 2020-01-07 DIAGNOSIS — R2689 Other abnormalities of gait and mobility: Secondary | ICD-10-CM

## 2020-01-07 DIAGNOSIS — M25662 Stiffness of left knee, not elsewhere classified: Secondary | ICD-10-CM

## 2020-01-07 DIAGNOSIS — Z9181 History of falling: Secondary | ICD-10-CM

## 2020-01-07 DIAGNOSIS — R2681 Unsteadiness on feet: Secondary | ICD-10-CM | POA: Diagnosis not present

## 2020-01-07 NOTE — Therapy (Signed)
[facility] Physical Therapy [address] [place], [place], [address] Phone: [phone]   Fax:  [phone]  Physical Therapy Treatment & 10th Visit Progress Note  Patient Details  Name: Jose Garrett MRN: [id number] Date of Birth: 11-12-1950 Referring Provider (PT): Curt Jews, MD   Encounter Date: 01/07/2020   Progress Note Reporting Period 11/19/2019 to 01/07/2020  See note below for Objective Data and Assessment of Progress/Goals.        PT End of Session - 01/07/20 1154    Visit Number 20    Number of Visits 39    Date for PT Re-Evaluation 03/11/20    Authorization Type [employer] Medicare    Authorization Time Period $30 co-pay    Authorization - Number of Visits --    Progress Note Due on Visit 41    PT Start Time 1148    PT Stop Time 1230    PT Time Calculation (min) 42 min    Equipment Utilized During Treatment Gait belt    Activity Tolerance Patient tolerated treatment well    Behavior During Therapy WFL for tasks assessed/performed           Past Medical History:  Diagnosis Date   Amyloidosis ([place])    Anemia of chronic disease    Anxiety    CAD (coronary artery disease)    a. cardiac cath on 11/25/17 with occlusion of the RCA which could have been his event but the RCA could not be engaged. The RCA filled distally from left to right collaterals. There were no severe stenoses in the left coronary system. Medical therapy recommended, started on plavix.   Chronic combined systolic and diastolic CHF (congestive heart failure) ([place])    a. EF dropped to 35-40% with grade 2 DD by echo 11/2017.   DJD (degenerative joint disease), lumbar    ESRD (end stage renal disease) ([place])    ESRD- HEMO MWF- [place]   GERD (gastroesophageal reflux disease)    Hyperlipidemia    Hypertension    Myocardial infarction [facility])    Neuropathy    Peripheral vascular disease (HCC)    Pneumonia    Restless legs    S/P TAVR (transcatheter aortic  valve replacement) 10/21/2018   26 mm Edwards Sapien 3 Ultra transcatheter heart valve placed via percutaneous right transfemoral approach    Severe aortic stenosis    severe    Past Surgical History:  Procedure Laterality Date   ABDOMINAL AORTOGRAM W/LOWER EXTREMITY Bilateral 07/16/2018   Procedure: ABDOMINAL AORTOGRAM W/LOWER EXTREMITY;  Surgeon: Wellington Hampshire, MD;  Location: [place] CV LAB;  Service: Cardiovascular;  Laterality: Bilateral;   ABDOMINAL AORTOGRAM W/LOWER EXTREMITY N/A 04/09/2019   Procedure: ABDOMINAL AORTOGRAM W/LOWER EXTREMITY - Right Lower;  Surgeon: Waynetta Sandy, MD;  Location: [place] CV [facility];  Service: Cardiovascular;  Laterality: N/A;   AMPUTATION Left 08/19/2018   Procedure: AMPUTATION LEFT TOES SECOND AND THIRD;  Surgeon: Waynetta Sandy, MD;  Location: [place];  Service: Vascular;  Laterality: Left;   AMPUTATION Right 05/07/2019   Procedure: AMPUTATION BELOW KNEE;  Surgeon: Serafina Mitchell, MD;  Location: [facility];  Service: Vascular;  Laterality: Right;   AV FISTULA PLACEMENT Right 12/27/2015   Procedure: Right Arm ARTERIOVENOUS (AV) FISTULA CREATION;  Surgeon: Angelia Mould, MD;  Location: [place];  Service: Vascular;  Laterality: Right;   CATARACT EXTRACTION W/ INTRAOCULAR LENS  IMPLANT, BILATERAL     COLONOSCOPY     ERCP W/ SPHICTEROTOMY  FEMORAL-POPLITEAL BYPASS GRAFT Left 07/22/2018   Procedure: left FEMORAL TO BELOW KNEE POPLITEAL ARTERY BYPASS GREAFT using 35m removable ring propaten gore graft;  Surgeon: CWaynetta Sandy MD;  [place]  Service: Vascular;  Laterality: Left;   FEMORAL-POPLITEAL BYPASS GRAFT Right 04/10/2019   Procedure: BYPASS GRAFT RIGHT FEMORAL-POPLITEAL ARTERY;  Surgeon: ERosetta Posner MD;  [place]  Service: Vascular;  Laterality: Right;   I & D EXTREMITY Right 05/05/2019   Procedure: Revision of  RIGHT  FOOT Amputation;  Surgeon: CWaynetta Sandy MD;   [place]  Service: Vascular;  Laterality: Right;   INTRAOPERATIVE TRANSTHORACIC ECHOCARDIOGRAM N/A 10/21/2018   Procedure: Intraoperative Transthoracic Echocardiogram;  Surgeon: CSherren Mocha MD;  [place]  Service: Open Heart Surgery;  Laterality: N/A;   IR GENERIC HISTORICAL  01/27/2016   IR FLUORO GUIDE CV LINE RIGHT 01/27/2016 DArne Cleveland MD [facility]   IR GENERIC HISTORICAL  01/27/2016   IR [place] ACCESS RIGHT 01/27/2016 DArne Cleveland MD [facility]   LAPAROSCOPIC CHOLECYSTECTOMY  03/2011   LEFT HEART CATH AND CORONARY ANGIOGRAPHY N/A 11/25/2017   Procedure: LEFT HEART CATH AND CORONARY ANGIOGRAPHY;  Surgeon: JMartinique Peter M, MD;  [facility];  Service: Cardiovascular;  Laterality: N/A;   LEFT HEART CATH AND CORONARY ANGIOGRAPHY N/A 07/24/2018   Procedure: LEFT HEART CATH AND CORONARY ANGIOGRAPHY;  Surgeon: CSherren Mocha MD;  [place];  Service: Cardiovascular;  Laterality: N/A;   RENAL BIOPSY, PERCUTANEOUS Right 09/08/2014   RIGHT/LEFT HEART CATH AND CORONARY ANGIOGRAPHY N/A 03/25/2018   Procedure: RIGHT/LEFT HEART CATH AND CORONARY ANGIOGRAPHY;  Surgeon: JMartinique Peter M, MD;  [place];  Service: Cardiovascular;  Laterality: N/A;   TEE WITHOUT CARDIOVERSION N/A 05/14/2019   Procedure: TRANSESOPHAGEAL ECHOCARDIOGRAM (TEE);  Surgeon: RFay Records MD;  [facility];  Service: Cardiovascular;  Laterality: N/A;   TOE AMPUTATION Left 08/19/2018   2nd & 3rd toes   TONSILLECTOMY  ~ 1960   TRANSCATHETER AORTIC VALVE REPLACEMENT, TRANSFEMORAL N/A 10/21/2018   Procedure: TRANSCATHETER AORTIC VALVE REPLACEMENT, TRANSFEMORAL;  Surgeon: CSherren Mocha MD;  [place]  Service: Open Heart Surgery;  Laterality: N/A;   TRANSMETATARSAL AMPUTATION Right 04/29/2019   Procedure: RIGHT TRANSMETATARSAL AMPUTATION;  Surgeon: CWaynetta Sandy MD;  [place]  Service: Vascular;   Laterality: Right;   ULTRASOUND GUIDANCE FOR VASCULAR ACCESS  03/25/2018   Procedure: Ultrasound Guidance For Vascular Access;  Surgeon: JMartinique Peter M, MD;  [place];  Service: Cardiovascular;;    There were no vitals filed for this visit.   Subjective Assessment - 01/07/20 1145    Subjective He saw [employer] last week and then again yesterday for pretibial pads & alignment changes.    Patient is accompained by: Family member   dtr, EAlysia Penna  Pertinent History Rt TTA, left 2nd & 3rd toe amp 08/19/2018, ESRD, HTN, CHF, aortic stenosis, left fem-pop bypass, peripheral neuropathy, back spasms,    Patient Stated Goals use prosthesis to be active in community, working on car, day travels, walks    Currently in Pain? Yes    Pain Location Back    Pain Orientation Lower;Mid    Pain Descriptors / Indicators Spasm;Sharp    Pain Type Chronic pain    Pain Radiating Towards some times to hamstrings    Pain Onset More than a month ago    Pain Frequency Constant  Aggravating Factors  unknown    Pain Relieving Factors injections & medications    Pain Onset More than a month ago              [facility] PT Assessment - 01/07/20 1145      Assessment   Medical Diagnosis Right Transtibial Amputation    Referring Provider (PT) Curt Jews, MD      Berg Balance Test   Sit to Stand Able to stand  independently using hands    Standing Unsupported Able to stand safely 2 minutes    Sitting with Back Unsupported but Feet Supported on Floor or Stool Able to sit safely and securely 2 minutes    Stand to Sit Sits safely with minimal use of hands    Transfers Able to transfer safely, definite need of hands    Standing Unsupported with Eyes Closed Able to stand 10 seconds with supervision    Standing Unsupported with Feet Together Able to place feet together independently and stand for 1 minute with supervision    From Standing, Reach Forward with Outstretched Arm Can reach forward >5 cm  safely (2")    From Standing Position, Pick up Object from Floor Able to pick up shoe, needs supervision    From Standing Position, Turn to Look Behind Over each Shoulder Looks behind one side only/other side shows less weight shift    Turn 360 Degrees Needs close supervision or verbal cueing    Standing Unsupported, Alternately Place Feet on Step/Stool Able to complete >2 steps/needs minimal assist    Standing Unsupported, One Foot in Front Needs help to step but can hold 15 seconds    Standing on One Leg Tries to lift leg/unable to hold 3 seconds but remains standing independently    Total Score 36                         [facility] Adult PT Treatment/Exercise - 01/07/20 1145      Transfers   Sit to Stand 5: Supervision;With upper extremity assist;With armrests;From chair/3-in-1;Other (comment)    Stand to Sit 5: Supervision;With armrests;To chair/3-in-1;With upper extremity assist;Other (comment)      Ambulation/Gait   Ambulation/Gait Yes    Ambulation/Gait Assistance 5: Supervision    Ambulation/Gait Assistance Details back pain increased from 5/10 to 7/10 and returned to 5/10 in 90 seconds.      Ambulation Distance (Feet) 250 Feet    Assistive device Straight cane;Prosthesis   stand alone tip on cane   Ambulation Surface Level;Indoor    Ramp 4: Min assist   cane stand alone tip & TTA prosthesis   Ramp Details (indicate cue type and reason) cues on technique    Curb 4: Min assist   cane stand alone tip & TTA prosthesis   Curb Details (indicate cue type and reason) cues on technique      Lumbar Exercises: Stretches   Active Hamstring Stretch Right;Left;3 reps;30 seconds    Active Hamstring Stretch Limitations seated knee extended forward lean    Gastroc Stretch Right;Left;3 reps;30 seconds    Gastroc Stretch Limitations seated w/leg extended and cane to DF ankle including prosthetic side to stretch remaining gastroc which effects knee      Knee/Hip Exercises: Aerobic    Nustep Level 6 for 8 min with BLEs & BUEs      Knee/Hip Exercises: Machines for Strengthening   Cybex Leg Press Shuttle leg press 62# BLEs 10 reps 2 sets  Prosthetics   Prosthetic Care Comments  Reviewed adjusting ply socks with pretibial pads.  Using bandaid over patella where callous. PT recommeded removing bandaid overnight under shrinker.     Current prosthetic wear tolerance (days/week)  daily    Current prosthetic wear tolerance (#hours/day)  reports most of awake hours. Sleeps after dialysis so decreased time but % is most of awake hours.     Current prosthetic weight-bearing tolerance (hours/day)  Patient tolerated 10 min of standing with partial weight on prosthesis without pain    Residual limb condition  callous over patella. No other skin issues.      Education Provided Skin check;Residual limb care;Correct ply sock adjustment    Person(s) Educated Patient    Education Method Explanation;Verbal cues    Education Method Verbalized understanding    Donning Prosthesis Modified independent (device/increased time)                    PT Short Term Goals - 01/07/20 1935      PT SHORT TERM GOAL #1   Title Patient verbalizes understanding of adjusting ply socks with limb volume changes including with dialysis.  (All STGs Target Date: 01/15/2020)    Time 4    Period Weeks    Status Achieved    Target Date 01/15/20      PT SHORT TERM GOAL #2   Title Patient reports back pain increases </= 3 increments on 0-10 scale.    Time 4    Period Weeks    Status Achieved    Target Date 01/15/20      PT SHORT TERM GOAL #3   Title Berg Balance >36/56    Time 4    Period Weeks    Status Achieved    Target Date 01/15/20      PT SHORT TERM GOAL #4   Title Patient ambulates 250' with cane & prosthesis with supervision.    Time 4    Period Weeks    Status Achieved    Target Date 01/15/20      PT SHORT TERM GOAL #5   Title Patient negotiates ramps & curbs with cane with  minimal assist and stairs with 1 rail/cane with supervision with prosthesis.    Time 4    Period Weeks    Status Achieved    Target Date 01/15/20             PT Long Term Goals - 12/15/19 1459      PT LONG TERM GOAL #1   Title Patient verbalizes & demonstrates understanding of proper prosthetic care to enable safe utilization of prosthesis.  (All LTGs Target Date: 03/11/2020)    Time 12    Period Weeks    Status On-going    Target Date 03/11/20      PT LONG TERM GOAL #2   Title Patient tolerates prosthesis wear >90% of awake hours without skin issues & limb pain </= 2/10 to enable function throughout his day.    Time 12    Period Weeks    Status On-going    Target Date 03/11/20      PT LONG TERM GOAL #3   Title Berg Balance with prosthesis >/= 45/56 indicating lower fall risk.    Time 12    Period Weeks    Status On-going    Target Date 03/11/20      PT LONG TERM GOAL #4   Title Patient ambulates 500' with cane or less &  prosthesis modified independent for community mobility.    Baseline 12/15/2019 not met    Time 12    Period Weeks    Status On-going    Target Date 03/11/20      PT LONG TERM GOAL #5   Title Patient negotiates ramps, curbs & stairs with single rail with cane or less and prosthesis modified independent to enable community access.    Time 12    Period Weeks    Status On-going    Target Date 03/11/20      PT LONG TERM GOAL #6   Title Patient reports back & residual limb pain increases </= 2 increments on 0-10 scale with standing & gait activities.    Time 12    Period Weeks    Status On-going    Target Date 03/11/20                 Plan - 01/07/20 1154    Clinical Impression Statement Patient met all STGs and is on target to meet LTGs.  Prosthetist added pre-tibial pads to socket and they improved fit.  Berg Balance 36/56 indicates improved balance but still has fall risk.    Personal Factors and Comorbidities Comorbidity 3+;Fitness;Time  since onset of injury/illness/exacerbation;Past/Current Experience    Comorbidities Rt TTA, left 2nd & 3rd toe amp 08/19/2018, ESRD, HTN, CHF, aortic stenosis, left fem-pop bypass, peripheral neuropathy, back spasms    Examination-Activity Limitations Lift;Locomotion Level;Stairs;Stand;Transfers    Examination-Participation Restrictions Community Activity;Shop    Stability/Clinical Decision Making Evolving/Moderate complexity    Rehab Potential Good    PT Frequency 2x / week    PT Duration 12 weeks    PT Treatment/Interventions ADLs/Self Care Home Management;Canalith Repostioning;DME Instruction;Gait training;Stair training;Functional mobility training;Therapeutic activities;Therapeutic exercise;Balance training;Neuromuscular re-education;Patient/family education;Prosthetic Training;Manual techniques;Scar mobilization;Passive range of motion;Dry needling;Joint Manipulations;Vestibular    PT Next Visit Plan set updated STGs,  progress balance & gait with TTA prosthesis    PT Home Exercise Plan .ampsink, see pt instructions section on 09/24/19 note  Access Code: [email or web address]    Consulted and Agree with Plan of Care Patient           Patient will benefit from skilled therapeutic intervention in order to improve the following deficits and impairments:  Abnormal gait, Decreased activity tolerance, Decreased balance, Decreased endurance, Decreased knowledge of use of DME, Decreased mobility, Decreased range of motion, Decreased skin integrity, Decreased scar mobility, Decreased strength, Dizziness, Increased edema, Postural dysfunction, Prosthetic Dependency, Pain  Visit Diagnosis: Muscle weakness (generalized)  Other abnormalities of gait and mobility  Unsteadiness on feet  Abnormal posture  Stiffness of right knee, not elsewhere classified  Stiffness of left knee, not elsewhere classified  Chronic bilateral low back pain without sciatica  History of falling     Problem List Patient  Active Problem List   Diagnosis Date Noted   Neuropathy 10/06/2019   Gait abnormality 08/06/2019   Back muscle spasm 07/09/2019   Sleep disturbance    Nausea without vomiting    Orthostatic hypotension    Acute on chronic anemia    Chronic combined systolic and diastolic congestive heart failure (HCC)    ESRD on dialysis ([place])    Right below-knee amputee ([place]) 05/18/2019   Pressure injury of skin 05/14/2019   Enterococcal bacteremia 05/13/2019   Polymorphic ventricular tachycardia ([place]) 10/24/2018   S/P TAVR (transcatheter aortic valve replacement) 10/21/2018   Anxiety    Coronary artery disease involving native coronary artery of native heart without angina pectoris  Thrombocytopenia ([place])    PAD (peripheral artery disease) ([place]) 07/22/2018   Acute on chronic combined systolic and diastolic CHF (congestive heart failure) ([place]) 11/30/2017   ESRD (end stage renal disease) on dialysis [facility])    Iron deficiency anemia 11/22/2015   AL amyloidosis ([place]) 09/25/2014   Anemia of chronic disease 09/25/2014   Essential hypertension, benign 11/06/2013    Jamey Reas PT, DPT 01/07/2020, 7:39 PM  [facility] Physical Therapy [address] [place], [place], [address] Phone: [phone]   Fax:  [phone]  Name: CAMRON MONDAY MRN: [id number] Date of Birth: 1950-09-09

## 2020-01-14 ENCOUNTER — Other Ambulatory Visit (HOSPITAL_COMMUNITY): Payer: Self-pay | Admitting: Physical Medicine and Rehabilitation

## 2020-01-19 ENCOUNTER — Ambulatory Visit (INDEPENDENT_AMBULATORY_CARE_PROVIDER_SITE_OTHER): Payer: Medicare Other | Admitting: Physical Therapy

## 2020-01-19 ENCOUNTER — Encounter: Payer: Self-pay | Admitting: Physical Therapy

## 2020-01-19 ENCOUNTER — Other Ambulatory Visit: Payer: Self-pay

## 2020-01-19 DIAGNOSIS — R2689 Other abnormalities of gait and mobility: Secondary | ICD-10-CM

## 2020-01-19 DIAGNOSIS — R2681 Unsteadiness on feet: Secondary | ICD-10-CM | POA: Diagnosis not present

## 2020-01-19 DIAGNOSIS — M545 Low back pain, unspecified: Secondary | ICD-10-CM

## 2020-01-19 DIAGNOSIS — M6281 Muscle weakness (generalized): Secondary | ICD-10-CM | POA: Diagnosis not present

## 2020-01-19 DIAGNOSIS — R293 Abnormal posture: Secondary | ICD-10-CM

## 2020-01-19 DIAGNOSIS — M25662 Stiffness of left knee, not elsewhere classified: Secondary | ICD-10-CM

## 2020-01-19 DIAGNOSIS — G8929 Other chronic pain: Secondary | ICD-10-CM

## 2020-01-19 DIAGNOSIS — M25661 Stiffness of right knee, not elsewhere classified: Secondary | ICD-10-CM

## 2020-01-19 DIAGNOSIS — Z9181 History of falling: Secondary | ICD-10-CM

## 2020-01-19 NOTE — Therapy (Signed)
[facility] [address] [place], [place], [address] Phone: [phone]   Fax:  [phone]  Physical Therapy Treatment  Patient Details  Name: Jose Garrett MRN: [id number] Date of Birth: 28-Sep-1950 Referring Provider (PT): Curt Jews, MD   Encounter Date: 01/19/2020   PT End of Session - 01/19/20 1354    Visit Number 21    Number of Visits 39    Date for PT Re-Evaluation 03/11/20    Authorization Type [employer] Medicare    Authorization Time Period $30 co-pay    Progress Note Due on Visit 30    PT Start Time 1348    PT Stop Time 1426    PT Time Calculation (min) 38 min    Equipment Utilized During Treatment Gait belt    Activity Tolerance Patient tolerated treatment well    Behavior During Therapy WFL for tasks assessed/performed           Past Medical History:  Diagnosis Date  . Amyloidosis ([place])   . Anemia of chronic disease   . Anxiety   . CAD (coronary artery disease)    a. cardiac cath on 11/25/17 with occlusion of the RCA which could have been his event but the RCA could not be engaged. The RCA filled distally from left to right collaterals. There were no severe stenoses in the left coronary system. Medical therapy recommended, started on plavix.  . Chronic combined systolic and diastolic CHF (congestive heart failure) ([place])    a. EF dropped to 35-40% with grade 2 DD by echo 11/2017.  [name] DJD (degenerative joint disease), lumbar   . ESRD (end stage renal disease) ([place])    ESRD- HEMO MWF- [employer] [employer]  . GERD (gastroesophageal reflux disease)   . Hyperlipidemia   . Hypertension   . Myocardial infarction ([place])   . Neuropathy   . Peripheral vascular disease ([place])   . Pneumonia   . Restless legs   . S/P TAVR (transcatheter aortic valve replacement) 10/21/2018   26 mm Edwards Sapien 3 Ultra transcatheter heart valve placed via percutaneous right transfemoral approach   . Severe aortic stenosis    severe    Past Surgical History:    Procedure Laterality Date  . ABDOMINAL AORTOGRAM W/LOWER EXTREMITY Bilateral 07/16/2018   Procedure: ABDOMINAL AORTOGRAM W/LOWER EXTREMITY;  Surgeon: Wellington Hampshire, MD;  Location: [place] [facility];  Service: Cardiovascular;  Laterality: Bilateral;  . ABDOMINAL AORTOGRAM W/LOWER EXTREMITY N/A 04/09/2019   Procedure: ABDOMINAL AORTOGRAM W/LOWER EXTREMITY - Right Lower;  Surgeon: Waynetta Sandy, MD;  Location: [place] [facility];  Service: Cardiovascular;  Laterality: N/A;  . AMPUTATION Left 08/19/2018   Procedure: AMPUTATION LEFT TOES SECOND AND THIRD;  Surgeon: Waynetta Sandy, MD;  Location: [place];  Service: Vascular;  Laterality: Left;  . AMPUTATION Right 05/07/2019   Procedure: AMPUTATION BELOW KNEE;  Surgeon: Serafina Mitchell, MD;  Location: [facility] OR;  Service: Vascular;  Laterality: Right;  . AV FISTULA PLACEMENT Right 12/27/2015   Procedure: Right Arm ARTERIOVENOUS (AV) FISTULA CREATION;  Surgeon: Angelia Mould, MD;  Location: [place];  Service: Vascular;  Laterality: Right;  . CATARACT EXTRACTION W/ INTRAOCULAR LENS  IMPLANT, BILATERAL    . COLONOSCOPY    . ERCP W/ SPHICTEROTOMY    . FEMORAL-POPLITEAL BYPASS GRAFT Left 07/22/2018   Procedure: left FEMORAL TO BELOW KNEE POPLITEAL ARTERY BYPASS GREAFT using 51mm removable ring propaten gore graft;  Surgeon: Waynetta Sandy, MD;  Location: [place];  Service: Vascular;  Laterality: Left;  . FEMORAL-POPLITEAL BYPASS GRAFT Right 04/10/2019   Procedure: BYPASS GRAFT RIGHT FEMORAL-POPLITEAL ARTERY;  Surgeon: Rosetta Posner, MD;  Location: [place];  Service: Vascular;  Laterality: Right;  . I & D EXTREMITY Right 05/05/2019   Procedure: Revision of  RIGHT  FOOT Amputation;  Surgeon: Waynetta Sandy, MD;  Location: [place];  Service: Vascular;  Laterality: Right;  . INTRAOPERATIVE TRANSTHORACIC ECHOCARDIOGRAM N/A 10/21/2018   Procedure: Intraoperative Transthoracic Echocardiogram;  Surgeon: Sherren Mocha, MD;   Location: [place];  Service: Open Heart Surgery;  Laterality: N/A;  . IR GENERIC HISTORICAL  01/27/2016   IR FLUORO GUIDE CV LINE RIGHT 01/27/2016 Arne Cleveland, MD [facility]  . IR GENERIC HISTORICAL  01/27/2016   IR US GUIDE VASC ACCESS RIGHT 01/27/2016 Arne Cleveland, MD [facility]  . LAPAROSCOPIC CHOLECYSTECTOMY  03/2011  . LEFT HEART CATH AND CORONARY ANGIOGRAPHY N/A 11/25/2017   Procedure: LEFT HEART CATH AND CORONARY ANGIOGRAPHY;  Surgeon: Martinique, Peter M, MD;  Location: [place] [facility];  Service: Cardiovascular;  Laterality: N/A;  . LEFT HEART CATH AND CORONARY ANGIOGRAPHY N/A 07/24/2018   Procedure: LEFT HEART CATH AND CORONARY ANGIOGRAPHY;  Surgeon: Sherren Mocha, MD;  Location: [place] [facility];  Service: Cardiovascular;  Laterality: N/A;  . RENAL BIOPSY, PERCUTANEOUS Right 09/08/2014  . RIGHT/LEFT HEART CATH AND CORONARY ANGIOGRAPHY N/A 03/25/2018   Procedure: RIGHT/LEFT HEART CATH AND CORONARY ANGIOGRAPHY;  Surgeon: Martinique, Peter M, MD;  Location: [place] [facility];  Service: Cardiovascular;  Laterality: N/A;  . TEE WITHOUT CARDIOVERSION N/A 05/14/2019   Procedure: TRANSESOPHAGEAL ECHOCARDIOGRAM (TEE);  Surgeon: Fay Records, MD;  Location: [facility] ENDOSCOPY;  Service: Cardiovascular;  Laterality: N/A;  . TOE AMPUTATION Left 08/19/2018   2nd & 3rd toes  . TONSILLECTOMY  ~ 1960  . TRANSCATHETER AORTIC VALVE REPLACEMENT, TRANSFEMORAL N/A 10/21/2018   Procedure: TRANSCATHETER AORTIC VALVE REPLACEMENT, TRANSFEMORAL;  Surgeon: Sherren Mocha, MD;  Location: [place];  Service: Open Heart Surgery;  Laterality: N/A;  . TRANSMETATARSAL AMPUTATION Right 04/29/2019   Procedure: RIGHT TRANSMETATARSAL AMPUTATION;  Surgeon: Waynetta Sandy, MD;  Location: [place];  Service: Vascular;  Laterality: Right;  . ULTRASOUND GUIDANCE FOR VASCULAR ACCESS  03/25/2018   Procedure: Ultrasound Guidance For Vascular Access;  Surgeon: Martinique, Peter M, MD;  Location: [place] [facility];  Service:  Cardiovascular;;    There were no vitals filed for this visit.                      [place] Adult PT Treatment/Exercise - 01/19/20 1348      Transfers   Sit to Stand 5: Supervision;With upper extremity assist;With armrests;From chair/3-in-1;Other (comment)    Stand to Sit 5: Supervision;With armrests;To chair/3-in-1;With upper extremity assist;Other (comment)      Ambulation/Gait   Ambulation/Gait Yes    Ambulation/Gait Assistance 5: Supervision    Ambulation/Gait Assistance Details worked on [place] while ambulating maintaining path & pace    Ambulation Distance (Feet) 250 Feet    Assistive device Straight cane;Prosthesis   stand alone tip on cane   Stairs Yes    Stairs Assistance 4: Min guard    Stairs Assistance Details (indicate cue type and reason) verbal cues on sequence with single rail & cane    Stair Management Technique One rail Right;One rail Left;With cane;Step to pattern;Forwards    Number of Stairs 11   6 rt rail/lt cane & 5 lt rail/rt cane  Ramp 4: Min assist   cane stand alone tip & TTA prosthesis   Ramp Details (indicate cue type and reason) cues on technique    Curb 4: Min assist   cane stand alone tip & TTA prosthesis   Curb Details (indicate cue type and reason) cues on technique      Lumbar Exercises: Stretches   Active Hamstring Stretch Right;Left;3 reps;30 seconds    Active Hamstring Stretch Limitations seated knee extended forward lean    Gastroc Stretch Right;Left;3 reps;30 seconds    Gastroc Stretch Limitations seated w/leg extended and cane to DF ankle including prosthetic side to stretch remaining gastroc which effects knee      Knee/Hip Exercises: Aerobic   Nustep Level 7 for 8 min with BLEs & BUEs      Knee/Hip Exercises: Machines for Strengthening   Cybex Leg Press Shuttle leg press 68# BLEs 15 reps 2 sets      Prosthetics   Prosthetic Care Comments  Reviewed adjusting ply socks with pretibial pads. after dialysis he should  require more socks and less just prior to dialyasis.     Current prosthetic wear tolerance (days/week)  daily    Current prosthetic wear tolerance (#hours/day)  reports most of awake hours. Sleeps after dialysis so decreased time but % is most of awake hours.     Current prosthetic weight-bearing tolerance (hours/day)  Patient tolerated 10 min of standing with partial weight on prosthesis without pain    Residual limb condition  callous over patella. No other skin issues.      Education Provided Skin check;Residual limb care;Correct ply sock adjustment;Other (comment);Prosthetic cleaning;Proper wear schedule/adjustment   see prosthetic care comments   Person(s) Educated Patient    Education Method Explanation;Demonstration;Verbal cues    Education Method Verbalized understanding;Returned demonstration;Tactile cues required;Verbal cues required;Needs further instruction    Donning Prosthesis Modified independent (device/increased time)                    PT Short Term Goals - 01/19/20 1355      PT SHORT TERM GOAL #1   Title Patient verbalizes understanding of adjusting ply socks with limb volume changes including with dialysis.  (All STGs Target Date: 02/12/2020)    Time 4    Period Weeks    Status On-going    Target Date 02/12/20      PT SHORT TERM GOAL #2   Title Patient reports back pain increases </= 3 increments on 0-10 scale.    Time 4    Period Weeks    Status On-going    Target Date 02/12/20      PT SHORT TERM GOAL #3   Title Berg Balance >40 / 56    Time 4    Period Weeks    Status Revised    Target Date 02/12/20      PT SHORT TERM GOAL #4   Title Patient ambulates 350' with cane & prosthesis with supervision.    Time 4    Period Weeks    Status On-going    Target Date 02/12/20      PT SHORT TERM GOAL #5   Title Patient negotiates ramps & curbs with cane with  supervision and stairs with 1 rail/cane with supervision with prosthesis.    Time 4    Period  Weeks    Status Revised    Target Date 02/12/20             PT Long Term  Goals - 12/15/19 1459      PT LONG TERM GOAL #1   Title Patient verbalizes & demonstrates understanding of proper prosthetic care to enable safe utilization of prosthesis.  (All LTGs Target Date: 03/11/2020)    Time 12    Period Weeks    Status On-going    Target Date 03/11/20      PT LONG TERM GOAL #2   Title Patient tolerates prosthesis wear >90% of awake hours without skin issues & limb pain </= 2/10 to enable function throughout his day.    Time 12    Period Weeks    Status On-going    Target Date 03/11/20      PT LONG TERM GOAL #3   Title Berg Balance with prosthesis >/= 45/56 indicating lower fall risk.    Time 12    Period Weeks    Status On-going    Target Date 03/11/20      PT LONG TERM GOAL #4   Title Patient ambulates 500' with cane or less & prosthesis modified independent for community mobility.    Baseline 12/15/2019 not met    Time 12    Period Weeks    Status On-going    Target Date 03/11/20      PT LONG TERM GOAL #5   Title Patient negotiates ramps, curbs & stairs with single rail with cane or less and prosthesis modified independent to enable community access.    Time 12    Period Weeks    Status On-going    Target Date 03/11/20      PT LONG TERM GOAL #6   Title Patient reports back & residual limb pain increases </= 2 increments on 0-10 scale with standing & gait activities.    Time 12    Period Weeks    Status On-going    Target Date 03/11/20                 Plan - 01/19/20 1355    Clinical Impression Statement PT reviewed prosthetic care issues including adjusting ply socks and need to wear prosthesis if out of bed even when not feeling well to reduce fall risk & energy expenditure.   He seems to understand.  Back pain limits patient but his overall function is slowly improving.    Personal Factors and Comorbidities Comorbidity 3+;Fitness;Time since onset of  injury/illness/exacerbation;Past/Current Experience    Comorbidities Rt TTA, left 2nd & 3rd toe amp 08/19/2018, ESRD, HTN, CHF, aortic stenosis, left fem-pop bypass, peripheral neuropathy, back spasms    Examination-Activity Limitations Lift;Locomotion Level;Stairs;Stand;Transfers    Examination-Participation Restrictions Community Activity;Shop    Stability/Clinical Decision Making Evolving/Moderate complexity    Rehab Potential Good    PT Frequency 2x / week    PT Duration 12 weeks    PT Treatment/Interventions ADLs/Self Care Home Management;Canalith Repostioning;DME Instruction;Gait training;Stair training;Functional mobility training;Therapeutic activities;Therapeutic exercise;Balance training;Neuromuscular re-education;Patient/family education;Prosthetic Training;Manual techniques;Scar mobilization;Passive range of motion;Dry needling;Joint Manipulations;Vestibular    PT Next Visit Plan set updated STGs,  progress balance & gait with TTA prosthesis    PT Home Exercise Plan .ampsink, see pt instructions section on 09/24/19 note  Access Code: [email or web address]    Consulted and Agree with Plan of Care Patient           Patient will benefit from skilled therapeutic intervention in order to improve the following deficits and impairments:  Abnormal gait, Decreased activity tolerance, Decreased balance, Decreased endurance, Decreased knowledge of use of DME, Decreased mobility, Decreased  range of motion, Decreased skin integrity, Decreased scar mobility, Decreased strength, Dizziness, Increased edema, Postural dysfunction, Prosthetic Dependency, Pain  Visit Diagnosis: Muscle weakness (generalized)  Unsteadiness on feet  Other abnormalities of gait and mobility  Abnormal posture  Stiffness of right knee, not elsewhere classified  Stiffness of left knee, not elsewhere classified  Chronic bilateral low back pain without sciatica  History of falling     Problem List Patient Active Problem List     Diagnosis Date Noted  . Neuropathy 10/06/2019  . Gait abnormality 08/06/2019  . Back muscle spasm 07/09/2019  . Sleep disturbance   . Nausea without vomiting   . Orthostatic hypotension   . Acute on chronic anemia   . Chronic combined systolic and diastolic congestive heart failure ([place])   . ESRD on dialysis ([place])   . Right below-knee amputee ([place]) 05/18/2019  . Pressure injury of skin 05/14/2019  . Enterococcal bacteremia 05/13/2019  . Polymorphic ventricular tachycardia ([place]) 10/24/2018  . S/P TAVR (transcatheter aortic valve replacement) 10/21/2018  . Anxiety   . Coronary artery disease involving native coronary artery of native heart without angina pectoris   . Thrombocytopenia ([place])   . PAD (peripheral artery disease) ([place]) 07/22/2018  . Acute on chronic combined systolic and diastolic CHF (congestive heart failure) ([place]) 11/30/2017  . ESRD (end stage renal disease) on dialysis ([place])   . Iron deficiency anemia 11/22/2015  . AL amyloidosis ([place]) 09/25/2014  . Anemia of chronic disease 09/25/2014  . Essential hypertension, benign 11/06/2013    Jamey Reas, PT, DPT 01/19/2020, 2:34 PM  [facility] [address] [place], [place], [address] Phone: [phone]   Fax:  [phone]  Name: ALFARD COCHRANE MRN: [id number] Date of Birth: 11-09-1950

## 2020-01-21 ENCOUNTER — Encounter: Payer: Medicare Other | Admitting: Physical Therapy

## 2020-01-23 DIAGNOSIS — M47816 Spondylosis without myelopathy or radiculopathy, lumbar region: Secondary | ICD-10-CM | POA: Insufficient documentation

## 2020-01-26 ENCOUNTER — Encounter: Payer: Self-pay | Admitting: Physical Therapy

## 2020-01-26 ENCOUNTER — Ambulatory Visit (INDEPENDENT_AMBULATORY_CARE_PROVIDER_SITE_OTHER): Payer: Medicare Other | Admitting: Physical Therapy

## 2020-01-26 ENCOUNTER — Other Ambulatory Visit: Payer: Self-pay

## 2020-01-26 DIAGNOSIS — R2681 Unsteadiness on feet: Secondary | ICD-10-CM

## 2020-01-26 DIAGNOSIS — R293 Abnormal posture: Secondary | ICD-10-CM | POA: Diagnosis not present

## 2020-01-26 DIAGNOSIS — M25661 Stiffness of right knee, not elsewhere classified: Secondary | ICD-10-CM

## 2020-01-26 DIAGNOSIS — M25662 Stiffness of left knee, not elsewhere classified: Secondary | ICD-10-CM

## 2020-01-26 DIAGNOSIS — R2689 Other abnormalities of gait and mobility: Secondary | ICD-10-CM

## 2020-01-26 DIAGNOSIS — M6281 Muscle weakness (generalized): Secondary | ICD-10-CM

## 2020-01-26 DIAGNOSIS — M545 Low back pain, unspecified: Secondary | ICD-10-CM

## 2020-01-26 DIAGNOSIS — G8929 Other chronic pain: Secondary | ICD-10-CM

## 2020-01-26 NOTE — Therapy (Signed)
[facility] Physical Therapy [address] [place], [place], [address] Phone: [phone]   Fax:  [phone]  Physical Therapy Treatment  Patient Details  Name: Jose Garrett MRN: [id number] Date of Birth: Dec 01, 1950 Referring Provider (PT): Curt Jews, MD   Encounter Date: 01/26/2020   PT End of Session - 01/26/20 1347    Visit Number 22    Number of Visits 39    Date for PT Re-Evaluation 03/11/20    Authorization Type [employer] Medicare    Authorization Time Period $30 co-pay    Progress Note Due on Visit 30    PT Start Time 1344    PT Stop Time 1422    PT Time Calculation (min) 38 min    Equipment Utilized During Treatment Gait belt    Activity Tolerance Patient tolerated treatment well    Behavior During Therapy WFL for tasks assessed/performed           Past Medical History:  Diagnosis Date  . Amyloidosis ([place])   . Anemia of chronic disease   . Anxiety   . CAD (coronary artery disease)    a. cardiac cath on 11/25/17 with occlusion of the RCA which could have been his event but the RCA could not be engaged. The RCA filled distally from left to right collaterals. There were no severe stenoses in the left coronary system. Medical therapy recommended, started on plavix.  . Chronic combined systolic and diastolic CHF (congestive heart failure) ([place])    a. EF dropped to 35-40% with grade 2 DD by echo 11/2017.  [name] DJD (degenerative joint disease), lumbar   . ESRD (end stage renal disease) ([place])    ESRD- HEMO MWF- [employer]  . GERD (gastroesophageal reflux disease)   . Hyperlipidemia   . Hypertension   . Myocardial infarction ([place])   . Neuropathy   . Peripheral vascular disease ([place])   . Pneumonia   . Restless legs   . S/P TAVR (transcatheter aortic valve replacement) 10/21/2018   26 mm Edwards Sapien 3 Ultra transcatheter heart valve placed via percutaneous right transfemoral approach   . Severe aortic stenosis    severe    Past Surgical History:    Procedure Laterality Date  . ABDOMINAL AORTOGRAM W/LOWER EXTREMITY Bilateral 07/16/2018   Procedure: ABDOMINAL AORTOGRAM W/LOWER EXTREMITY;  Surgeon: Wellington Hampshire, MD;  Location: [place] [facility];  Service: Cardiovascular;  Laterality: Bilateral;  . ABDOMINAL AORTOGRAM W/LOWER EXTREMITY N/A 04/09/2019   Procedure: ABDOMINAL AORTOGRAM W/LOWER EXTREMITY - Right Lower;  Surgeon: Waynetta Sandy, MD;  Location: [place] [facility];  Service: Cardiovascular;  Laterality: N/A;  . AMPUTATION Left 08/19/2018   Procedure: AMPUTATION LEFT TOES SECOND AND THIRD;  Surgeon: Waynetta Sandy, MD;  Location: [place];  Service: Vascular;  Laterality: Left;  . AMPUTATION Right 05/07/2019   Procedure: AMPUTATION BELOW KNEE;  Surgeon: Serafina Mitchell, MD;  [facility] OR;  Service: Vascular;  Laterality: Right;  . AV FISTULA PLACEMENT Right 12/27/2015   Procedure: Right Arm ARTERIOVENOUS (AV) FISTULA CREATION;  Surgeon: Angelia Mould, MD;  Location: [place];  Service: Vascular;  Laterality: Right;  . CATARACT EXTRACTION W/ INTRAOCULAR LENS  IMPLANT, BILATERAL    . COLONOSCOPY    . ERCP W/ SPHICTEROTOMY    . FEMORAL-POPLITEAL BYPASS GRAFT Left 07/22/2018   Procedure: left FEMORAL TO BELOW KNEE POPLITEAL ARTERY BYPASS GREAFT using 31mm removable ring propaten gore graft;  Surgeon: Waynetta Sandy, MD;  Location: [place];  Service: Vascular;  Laterality: Left;  . FEMORAL-POPLITEAL BYPASS GRAFT Right 04/10/2019   Procedure: BYPASS GRAFT RIGHT FEMORAL-POPLITEAL ARTERY;  Surgeon: Rosetta Posner, MD;  Location: [place];  Service: Vascular;  Laterality: Right;  . I & D EXTREMITY Right 05/05/2019   Procedure: Revision of  RIGHT  FOOT Amputation;  Surgeon: Waynetta Sandy, MD;  Location: [place];  Service: Vascular;  Laterality: Right;  . INTRAOPERATIVE TRANSTHORACIC ECHOCARDIOGRAM N/A 10/21/2018   Procedure: Intraoperative Transthoracic Echocardiogram;  Surgeon: Sherren Mocha, MD;   Location: [place];  Service: Open Heart Surgery;  Laterality: N/A;  . IR GENERIC HISTORICAL  01/27/2016   IR FLUORO GUIDE CV LINE RIGHT 01/27/2016 Arne Cleveland, MD [facility]  . IR GENERIC HISTORICAL  01/27/2016   IR US GUIDE VASC ACCESS RIGHT 01/27/2016 Arne Cleveland, MD [facility]  . LAPAROSCOPIC CHOLECYSTECTOMY  03/2011  . LEFT HEART CATH AND CORONARY ANGIOGRAPHY N/A 11/25/2017   Procedure: LEFT HEART CATH AND CORONARY ANGIOGRAPHY;  Surgeon: Martinique, Peter M, MD;  Location: [place] [facility];  Service: Cardiovascular;  Laterality: N/A;  . LEFT HEART CATH AND CORONARY ANGIOGRAPHY N/A 07/24/2018   Procedure: LEFT HEART CATH AND CORONARY ANGIOGRAPHY;  Surgeon: Sherren Mocha, MD;  Location: [place] [facility];  Service: Cardiovascular;  Laterality: N/A;  . RENAL BIOPSY, PERCUTANEOUS Right 09/08/2014  . RIGHT/LEFT HEART CATH AND CORONARY ANGIOGRAPHY N/A 03/25/2018   Procedure: RIGHT/LEFT HEART CATH AND CORONARY ANGIOGRAPHY;  Surgeon: Martinique, Peter M, MD;  Location: [place] [facility];  Service: Cardiovascular;  Laterality: N/A;  . TEE WITHOUT CARDIOVERSION N/A 05/14/2019   Procedure: TRANSESOPHAGEAL ECHOCARDIOGRAM (TEE);  Surgeon: Fay Records, MD;  Location: [facility] ENDOSCOPY;  Service: Cardiovascular;  Laterality: N/A;  . TOE AMPUTATION Left 08/19/2018   2nd & 3rd toes  . TONSILLECTOMY  ~ 1960  . TRANSCATHETER AORTIC VALVE REPLACEMENT, TRANSFEMORAL N/A 10/21/2018   Procedure: TRANSCATHETER AORTIC VALVE REPLACEMENT, TRANSFEMORAL;  Surgeon: Sherren Mocha, MD;  Location: [place];  Service: Open Heart Surgery;  Laterality: N/A;  . TRANSMETATARSAL AMPUTATION Right 04/29/2019   Procedure: RIGHT TRANSMETATARSAL AMPUTATION;  Surgeon: Waynetta Sandy, MD;  Location: [place];  Service: Vascular;  Laterality: Right;  . ULTRASOUND GUIDANCE FOR VASCULAR ACCESS  03/25/2018   Procedure: Ultrasound Guidance For Vascular Access;  Surgeon: Martinique, Peter M, MD;  Location: [place] [facility];  Service:  Cardiovascular;;    There were no vitals filed for this visit.   Subjective Assessment - 01/26/20 1344    Subjective He has been having issues with BP at dialysis.  He is wearing prosthesis most awake hours including to dialysis.    Patient is accompained by: Family member   dtr, Alysia Penna   Pertinent History Rt TTA, left 2nd & 3rd toe amp 08/19/2018, ESRD, HTN, CHF, aortic stenosis, left fem-pop bypass, peripheral neuropathy, back spasms,    Patient Stated Goals use prosthesis to be active in community, working on car, day travels, walks    Currently in Pain? Yes    Pain Score 7     Pain Location Back    Pain Orientation Lower    Pain Descriptors / Indicators Spasm;Aching;Sore    Pain Type Chronic pain    Pain Onset More than a month ago    Pain Frequency Constant    Aggravating Factors  sitting after standing & walking    Pain Relieving Factors medications, injections    Pain Onset More than a month ago  [place] Adult PT Treatment/Exercise - 01/26/20 1344      Transfers   Sit to Stand 5: Supervision;With upper extremity assist;With armrests;From chair/3-in-1;Other (comment)    Stand to Sit 5: Supervision;With armrests;To chair/3-in-1;With upper extremity assist;Other (comment)      Ambulation/Gait   Ambulation/Gait Yes    Ambulation/Gait Assistance 5: Supervision    Ambulation Distance (Feet) 250 Feet    Assistive device Straight cane;Prosthesis   stand alone tip on cane   Stairs --    Stairs Assistance --    Stair Management Technique --    Number of Stairs --    Ramp 4: Min assist   cane stand alone tip & TTA prosthesis   Curb 4: Min assist   cane stand alone tip & TTA prosthesis     Lumbar Exercises: Stretches   Active Hamstring Stretch Right;Left;3 reps;30 seconds    Active Hamstring Stretch Limitations seated knee extended forward lean    Gastroc Stretch Right;Left;3 reps;30 seconds    Gastroc Stretch Limitations seated  w/leg extended and cane to DF ankle including prosthetic side to stretch remaining gastroc which effects knee      Knee/Hip Exercises: Aerobic   Nustep Level 7 for 8 min with BLEs & BUEs      Knee/Hip Exercises: Machines for Strengthening   Cybex Leg Press Shuttle leg press 68# BLEs 15 reps 2 sets      Knee/Hip Exercises: Standing   Forward Step Up Right;1 set;10 reps;Hand Hold: 2;Step Height: 4"    Forward Step Up Limitations demo & verbal cues on technique    Step Down Right;1 set;10 reps;Hand Hold: 2;Step Height: 4"    Step Down Limitations demo & verbal cues on technique    Other Standing Knee Exercises sit to/from stand without UEs support //close goal to not touch from 24" bar stool 10 reps      Prosthetics   Prosthetic Care Comments  Reviewed adjusting ply socks with pretibial pads. after dialysis he should require more socks and less just prior to dialysis.     Current prosthetic wear tolerance (days/week)  daily    Current prosthetic wear tolerance (#hours/day)  reports most of awake hours. Sleeps after dialysis so decreased time but % is most of awake hours.     Current prosthetic weight-bearing tolerance (hours/day)  Patient tolerated 10 min of standing with partial weight on prosthesis without pain    Residual limb condition  callous over patella. No other skin issues.      Education Provided Skin check;Residual limb care;Correct ply sock adjustment;Other (comment);Prosthetic cleaning;Proper wear schedule/adjustment   see prosthetic care comments   Person(s) Educated Patient    Education Method Explanation;Verbal cues    Education Method Verbalized understanding;Verbal cues required;Needs further instruction                    PT Short Term Goals - 01/19/20 1355      PT SHORT TERM GOAL #1   Title Patient verbalizes understanding of adjusting ply socks with limb volume changes including with dialysis.  (All STGs Target Date: 02/12/2020)    Time 4    Period Weeks      Status On-going    Target Date 02/12/20      PT SHORT TERM GOAL #2   Title Patient reports back pain increases </= 3 increments on 0-10 scale.    Time 4    Period Weeks    Status On-going    Target Date 02/12/20  PT SHORT TERM GOAL #3   Title Berg Balance >40 / 56    Time 4    Period Weeks    Status Revised    Target Date 02/12/20      PT SHORT TERM GOAL #4   Title Patient ambulates 350' with cane & prosthesis with supervision.    Time 4    Period Weeks    Status On-going    Target Date 02/12/20      PT SHORT TERM GOAL #5   Title Patient negotiates ramps & curbs with cane with  supervision and stairs with 1 rail/cane with supervision with prosthesis.    Time 4    Period Weeks    Status Revised    Target Date 02/12/20             PT Long Term Goals - 12/15/19 1459      PT LONG TERM GOAL #1   Title Patient verbalizes & demonstrates understanding of proper prosthetic care to enable safe utilization of prosthesis.  (All LTGs Target Date: 03/11/2020)    Time 12    Period Weeks    Status On-going    Target Date 03/11/20      PT LONG TERM GOAL #2   Title Patient tolerates prosthesis wear >90% of awake hours without skin issues & limb pain </= 2/10 to enable function throughout his day.    Time 12    Period Weeks    Status On-going    Target Date 03/11/20      PT LONG TERM GOAL #3   Title Berg Balance with prosthesis >/= 45/56 indicating lower fall risk.    Time 12    Period Weeks    Status On-going    Target Date 03/11/20      PT LONG TERM GOAL #4   Title Patient ambulates 500' with cane or less & prosthesis modified independent for community mobility.    Baseline 12/15/2019 not met    Time 12    Period Weeks    Status On-going    Target Date 03/11/20      PT LONG TERM GOAL #5   Title Patient negotiates ramps, curbs & stairs with single rail with cane or less and prosthesis modified independent to enable community access.    Time 12    Period Weeks     Status On-going    Target Date 03/11/20      PT LONG TERM GOAL #6   Title Patient reports back & residual limb pain increases </= 2 increments on 0-10 scale with standing & gait activities.    Time 12    Period Weeks    Status On-going    Target Date 03/11/20                 Plan - 01/26/20 1347    Clinical Impression Statement PT worked on functional balance & strength.  He is limited in activity tolerance by LBP & dialysis fatigue. He is improving but slowly.    Personal Factors and Comorbidities Comorbidity 3+;Fitness;Time since onset of injury/illness/exacerbation;Past/Current Experience    Comorbidities Rt TTA, left 2nd & 3rd toe amp 08/19/2018, ESRD, HTN, CHF, aortic stenosis, left fem-pop bypass, peripheral neuropathy, back spasms    Examination-Activity Limitations Lift;Locomotion Level;Stairs;Stand;Transfers    Examination-Participation Restrictions Community Activity;Shop    Stability/Clinical Decision Making Evolving/Moderate complexity    Rehab Potential Good    PT Frequency 2x / week    PT Duration 12 weeks  PT Treatment/Interventions ADLs/Self Care Home Management;Canalith Repostioning;DME Instruction;Gait training;Stair training;Functional mobility training;Therapeutic activities;Therapeutic exercise;Balance training;Neuromuscular re-education;Patient/family education;Prosthetic Training;Manual techniques;Scar mobilization;Passive range of motion;Dry needling;Joint Manipulations;Vestibular    PT Next Visit Plan work towards updated STGs,  progress balance & gait with TTA prosthesis    PT Home Exercise Plan .ampsink, see pt instructions section on 09/24/19 note  Access Code: [email or web address]    Consulted and Agree with Plan of Care Patient           Patient will benefit from skilled therapeutic intervention in order to improve the following deficits and impairments:  Abnormal gait, Decreased activity tolerance, Decreased balance, Decreased endurance, Decreased knowledge  of use of DME, Decreased mobility, Decreased range of motion, Decreased skin integrity, Decreased scar mobility, Decreased strength, Dizziness, Increased edema, Postural dysfunction, Prosthetic Dependency, Pain  Visit Diagnosis: Muscle weakness (generalized)  Unsteadiness on feet  Other abnormalities of gait and mobility  Abnormal posture  Stiffness of right knee, not elsewhere classified  Stiffness of left knee, not elsewhere classified  Chronic bilateral low back pain without sciatica     Problem List Patient Active Problem List   Diagnosis Date Noted  . Neuropathy 10/06/2019  . Gait abnormality 08/06/2019  . Back muscle spasm 07/09/2019  . Sleep disturbance   . Nausea without vomiting   . Orthostatic hypotension   . Acute on chronic anemia   . Chronic combined systolic and diastolic congestive heart failure ([place])   . ESRD on dialysis ([place])   . Right below-knee amputee ([place]) 05/18/2019  . Pressure injury of skin 05/14/2019  . Enterococcal bacteremia 05/13/2019  . Polymorphic ventricular tachycardia ([place]) 10/24/2018  . S/P TAVR (transcatheter aortic valve replacement) 10/21/2018  . Anxiety   . Coronary artery disease involving native coronary artery of native heart without angina pectoris   . Thrombocytopenia ([place])   . PAD (peripheral artery disease) ([place]) 07/22/2018  . Acute on chronic combined systolic and diastolic CHF (congestive heart failure) ([place]) 11/30/2017  . ESRD (end stage renal disease) on dialysis ([place])   . Iron deficiency anemia 11/22/2015  . AL amyloidosis ([place]) 09/25/2014  . Anemia of chronic disease 09/25/2014  . Essential hypertension, benign 11/06/2013    Jamey Reas PT, DPT 01/26/2020, 2:33 PM  [facility] [address] [place], [place], [address] Phone: [phone]   Fax:  [phone]  Name: Jose Garrett MRN: [id number] Date of Birth: 28-Oct-1950

## 2020-01-28 ENCOUNTER — Encounter: Payer: Medicare Other | Admitting: Physical Therapy

## 2020-02-01 ENCOUNTER — Encounter: Payer: Medicare Other | Admitting: Physical Therapy

## 2020-02-02 ENCOUNTER — Encounter: Payer: Medicare Other | Admitting: Physical Therapy

## 2020-02-02 ENCOUNTER — Ambulatory Visit: Payer: Medicare Other | Admitting: Cardiovascular Disease

## 2020-02-03 ENCOUNTER — Encounter: Payer: Self-pay | Admitting: Physical Therapy

## 2020-02-09 ENCOUNTER — Encounter: Payer: Medicare Other | Admitting: Physical Therapy

## 2020-02-11 ENCOUNTER — Encounter: Payer: Medicare Other | Admitting: Physical Therapy

## 2020-02-16 ENCOUNTER — Encounter: Payer: Self-pay | Admitting: Physical Therapy

## 2020-02-16 ENCOUNTER — Ambulatory Visit (INDEPENDENT_AMBULATORY_CARE_PROVIDER_SITE_OTHER): Payer: Medicare Other | Admitting: Physical Therapy

## 2020-02-16 ENCOUNTER — Other Ambulatory Visit: Payer: Self-pay

## 2020-02-16 DIAGNOSIS — R2689 Other abnormalities of gait and mobility: Secondary | ICD-10-CM

## 2020-02-16 DIAGNOSIS — M6281 Muscle weakness (generalized): Secondary | ICD-10-CM

## 2020-02-16 DIAGNOSIS — M545 Low back pain, unspecified: Secondary | ICD-10-CM

## 2020-02-16 DIAGNOSIS — R2681 Unsteadiness on feet: Secondary | ICD-10-CM

## 2020-02-16 DIAGNOSIS — G8929 Other chronic pain: Secondary | ICD-10-CM

## 2020-02-16 DIAGNOSIS — Z9181 History of falling: Secondary | ICD-10-CM

## 2020-02-16 DIAGNOSIS — M25662 Stiffness of left knee, not elsewhere classified: Secondary | ICD-10-CM

## 2020-02-16 DIAGNOSIS — R293 Abnormal posture: Secondary | ICD-10-CM

## 2020-02-16 DIAGNOSIS — M25661 Stiffness of right knee, not elsewhere classified: Secondary | ICD-10-CM

## 2020-02-16 NOTE — Therapy (Signed)
[facility] Physical Therapy [address] [place], [place], [address] Phone: [phone]   Fax:  [phone]  Physical Therapy Treatment  Patient Details  Name: Jose Garrett MRN: [id number] Date of Birth: 17-Dec-1950 Referring Provider (PT): Curt Jews, MD   Encounter Date: 02/16/2020   PT End of Session - 02/16/20 1350    Visit Number 23    Number of Visits 39    Date for PT Re-Evaluation 03/11/20    Authorization Type [employer] Medicare    Authorization Time Period $30 co-pay    Progress Note Due on Visit 30    PT Start Time 1345    PT Stop Time 1425    PT Time Calculation (min) 40 min    Equipment Utilized During Treatment Gait belt    Activity Tolerance Patient tolerated treatment well    Behavior During Therapy WFL for tasks assessed/performed           Past Medical History:  Diagnosis Date  . Amyloidosis ([place])   . Anemia of chronic disease   . Anxiety   . CAD (coronary artery disease)    a. cardiac cath on 11/25/17 with occlusion of the RCA which could have been his event but the RCA could not be engaged. The RCA filled distally from left to right collaterals. There were no severe stenoses in the left coronary system. Medical therapy recommended, started on plavix.  . Chronic combined systolic and diastolic CHF (congestive heart failure) ([place])    a. EF dropped to 35-40% with grade 2 DD by echo 11/2017.  [name] DJD (degenerative joint disease), lumbar   . ESRD (end stage renal disease) ([place])    ESRD- HEMO MWF- [employer]  . GERD (gastroesophageal reflux disease)   . Hyperlipidemia   . Hypertension   . Myocardial infarction ([place])   . Neuropathy   . Peripheral vascular disease ([place])   . Pneumonia   . Restless legs   . S/P TAVR (transcatheter aortic valve replacement) 10/21/2018   26 mm Edwards Sapien 3 Ultra transcatheter heart valve placed via percutaneous right transfemoral approach   . Severe aortic stenosis    severe    Past Surgical History:    Procedure Laterality Date  . ABDOMINAL AORTOGRAM W/LOWER EXTREMITY Bilateral 07/16/2018   Procedure: ABDOMINAL AORTOGRAM W/LOWER EXTREMITY;  Surgeon: Wellington Hampshire, MD;  Location: [place] [facility];  Service: Cardiovascular;  Laterality: Bilateral;  . ABDOMINAL AORTOGRAM W/LOWER EXTREMITY N/A 04/09/2019   Procedure: ABDOMINAL AORTOGRAM W/LOWER EXTREMITY - Right Lower;  Surgeon: Waynetta Sandy, MD;  Location: [place] [facility];  Service: Cardiovascular;  Laterality: N/A;  . AMPUTATION Left 08/19/2018   Procedure: AMPUTATION LEFT TOES SECOND AND THIRD;  Surgeon: Waynetta Sandy, MD;  Location: [place];  Service: Vascular;  Laterality: Left;  . AMPUTATION Right 05/07/2019   Procedure: AMPUTATION BELOW KNEE;  Surgeon: Serafina Mitchell, MD;  Location: [facility];  Service: Vascular;  Laterality: Right;  . AV FISTULA PLACEMENT Right 12/27/2015   Procedure: Right Arm ARTERIOVENOUS (AV) FISTULA CREATION;  Surgeon: Angelia Mould, MD;  Location: [place];  Service: Vascular;  Laterality: Right;  . CATARACT EXTRACTION W/ INTRAOCULAR LENS  IMPLANT, BILATERAL    . COLONOSCOPY    . ERCP W/ SPHICTEROTOMY    . FEMORAL-POPLITEAL BYPASS GRAFT Left 07/22/2018   Procedure: left FEMORAL TO BELOW KNEE POPLITEAL ARTERY BYPASS GREAFT using 16mm removable ring propaten gore graft;  Surgeon: Waynetta Sandy, MD;  Location: [place];  Service: Vascular;  Laterality: Left;  . FEMORAL-POPLITEAL BYPASS GRAFT Right 04/10/2019   Procedure: BYPASS GRAFT RIGHT FEMORAL-POPLITEAL ARTERY;  Surgeon: Rosetta Posner, MD;  Location: [place];  Service: Vascular;  Laterality: Right;  . I & D EXTREMITY Right 05/05/2019   Procedure: Revision of  RIGHT  FOOT Amputation;  Surgeon: Waynetta Sandy, MD;  Location: [place];  Service: Vascular;  Laterality: Right;  . INTRAOPERATIVE TRANSTHORACIC ECHOCARDIOGRAM N/A 10/21/2018   Procedure: Intraoperative Transthoracic Echocardiogram;  Surgeon: Sherren Mocha, MD;   Location: [place];  Service: Open Heart Surgery;  Laterality: N/A;  . IR GENERIC HISTORICAL  01/27/2016   IR FLUORO GUIDE CV LINE RIGHT 01/27/2016 Arne Cleveland, MD [facility]  . IR GENERIC HISTORICAL  01/27/2016   IR US GUIDE VASC ACCESS RIGHT 01/27/2016 Arne Cleveland, MD [facility]  . LAPAROSCOPIC CHOLECYSTECTOMY  03/2011  . LEFT HEART CATH AND CORONARY ANGIOGRAPHY N/A 11/25/2017   Procedure: LEFT HEART CATH AND CORONARY ANGIOGRAPHY;  Surgeon: Martinique, Peter M, MD;  Location: [place] [facility];  Service: Cardiovascular;  Laterality: N/A;  . LEFT HEART CATH AND CORONARY ANGIOGRAPHY N/A 07/24/2018   Procedure: LEFT HEART CATH AND CORONARY ANGIOGRAPHY;  Surgeon: Sherren Mocha, MD;  Location: [place] [facility];  Service: Cardiovascular;  Laterality: N/A;  . RENAL BIOPSY, PERCUTANEOUS Right 09/08/2014  . RIGHT/LEFT HEART CATH AND CORONARY ANGIOGRAPHY N/A 03/25/2018   Procedure: RIGHT/LEFT HEART CATH AND CORONARY ANGIOGRAPHY;  Surgeon: Martinique, Peter M, MD;  Location: [place] [facility];  Service: Cardiovascular;  Laterality: N/A;  . TEE WITHOUT CARDIOVERSION N/A 05/14/2019   Procedure: TRANSESOPHAGEAL ECHOCARDIOGRAM (TEE);  Surgeon: Fay Records, MD;  Location: [facility] ENDOSCOPY;  Service: Cardiovascular;  Laterality: N/A;  . TOE AMPUTATION Left 08/19/2018   2nd & 3rd toes  . TONSILLECTOMY  ~ 1960  . TRANSCATHETER AORTIC VALVE REPLACEMENT, TRANSFEMORAL N/A 10/21/2018   Procedure: TRANSCATHETER AORTIC VALVE REPLACEMENT, TRANSFEMORAL;  Surgeon: Sherren Mocha, MD;  Location: [place];  Service: Open Heart Surgery;  Laterality: N/A;  . TRANSMETATARSAL AMPUTATION Right 04/29/2019   Procedure: RIGHT TRANSMETATARSAL AMPUTATION;  Surgeon: Waynetta Sandy, MD;  Location: [place];  Service: Vascular;  Laterality: Right;  . ULTRASOUND GUIDANCE FOR VASCULAR ACCESS  03/25/2018   Procedure: Ultrasound Guidance For Vascular Access;  Surgeon: Martinique, Peter M, MD;  Location: [place] [facility];  Service:  Cardiovascular;;    There were no vitals filed for this visit.   Subjective Assessment - 02/16/20 1345    Subjective He fell one time trying to walk without prosthesis because couldn't get it on his limb.  He had steriod shot for back and it helped.  He has but on weight so his dry weight is not correct. They are pulling too much fluid off because of this.    Patient is accompained by: Family member   dtr, Alysia Penna   Pertinent History Rt TTA, left 2nd & 3rd toe amp 08/19/2018, ESRD, HTN, CHF, aortic stenosis, left fem-pop bypass, peripheral neuropathy, back spasms,    Patient Stated Goals use prosthesis to be active in community, working on car, day travels, walks    Currently in Pain? Yes    Pain Score 6     Pain Location Back    Pain Orientation Lower    Pain Descriptors / Indicators Spasm    Pain Type Chronic pain    Pain Onset More than a month ago    Pain Frequency Constant    Aggravating Factors  unknown    Pain Relieving Factors steriod shots.    Pain Onset More than a month ago              [facility] PT Assessment - 02/16/20 1345      Assessment   Medical Diagnosis Right Transtibial Amputation    Referring Provider (PT) Curt Jews, MD      Ambulation/Gait   Ambulation Distance (Feet) 350 Feet      Berg Balance Test   Sit to Stand Able to stand without using hands and stabilize independently    Standing Unsupported Able to stand safely 2 minutes    Sitting with Back Unsupported but Feet Supported on Floor or Stool Able to sit safely and securely 2 minutes    Stand to Sit Sits safely with minimal use of hands    Transfers Able to transfer safely, minor use of hands    Standing Unsupported with Eyes Closed Able to stand 10 seconds with supervision    Standing Unsupported with Feet Together Able to place feet together independently and stand for 1 minute with supervision    From Standing, Reach Forward with Outstretched Arm Can reach forward >12 cm safely (5")    From  Standing Position, Pick up Object from [place] to pick up shoe, needs supervision    From Standing Position, Turn to Look Behind Over each Shoulder Looks behind one side only/other side shows less weight shift    Turn 360 Degrees Able to turn 360 degrees safely but slowly    Standing Unsupported, Alternately Place Feet on Step/Stool Able to complete >2 steps/needs minimal assist    Standing Unsupported, One Foot in Front Needs help to step but can hold 15 seconds    Standing on One Leg Tries to lift leg/unable to hold 3 seconds but remains standing independently    Total Score 40    Berg comment: 10/28 was 36/56  & 7/13 was 12/56                         [facility] Adult PT Treatment/Exercise - 02/16/20 1345      Transfers   Sit to Stand 5: Supervision;With upper extremity assist;With armrests;From chair/3-in-1;Other (comment)    Stand to Sit 5: Supervision;With armrests;To chair/3-in-1;With upper extremity assist;Other (comment)      Ambulation/Gait   Ambulation/Gait Yes    Ambulation/Gait Assistance 5: Supervision    Assistive device Straight cane;Prosthesis   stand alone tip on cane   Stairs Yes    Stairs Assistance 5: Supervision    Stairs Assistance Details (indicate cue type and reason) verbal cues on sequence with single rail & cane    Stair Management Technique One rail Right;One rail Left;With cane;Step to pattern;Forwards    Number of Stairs 8   4 right rail & 4 left rail   Ramp 4: Min assist   min guard cane & TTA prosthesis   Ramp Details (indicate cue type and reason) verbal cues on technique    Curb 4: Min assist   min guard, cane & TTA prosthesis   Curb Details (indicate cue type and reason) verbal cues on technique      High Level Balance   High Level Balance Activities Side stepping;Backward walking;Negotiating over obstacles;Other (comment)   walking with eyes closed   High Level Balance Comments cane & TTA prosthesis, cues on technique.       Lumbar  Exercises: Stretches   Active Hamstring Stretch --  Active Hamstring Stretch Limitations --    Gastroc Stretch --    Gastroc Stretch Limitations --      Knee/Hip Exercises: Aerobic   Nustep Level 7 for 8 min with BLEs & BUEs      Knee/Hip Exercises: Machines for Strengthening   Cybex Leg Press --      Knee/Hip Exercises: Standing   Forward Step Up --    Forward Step Up Limitations --    Step Down --    Step Down Limitations --    Other Standing Knee Exercises --      Prosthetics   Prosthetic Care Comments  fall risk when prosthesis is off during awake hours.     Current prosthetic wear tolerance (days/week)  daily    Current prosthetic wear tolerance (#hours/day)  reports most of awake hours. Sleeps after dialysis so decreased time but % is most of awake hours.     Current prosthetic weight-bearing tolerance (hours/day)  Patient tolerated 10 min of standing with partial weight on prosthesis without pain    Residual limb condition  callous over patella. No other skin issues.      Education Provided Other (comment)   see prosthetic care comments   Person(s) Educated Patient    Education Method Explanation;Verbal cues    Education Method Verbalized understanding                    PT Short Term Goals - 02/16/20 1426      PT SHORT TERM GOAL #1   Title Patient verbalizes understanding of adjusting ply socks with limb volume changes including with dialysis.  (All STGs Target Date: 02/12/2020)    Baseline MET 02/16/2020    Time 4    Period Weeks    Status Achieved    Target Date 02/12/20      PT SHORT TERM GOAL #2   Title Patient reports back pain increases </= 3 increments on 0-10 scale.    Baseline MET 02/16/2020    Time 4    Period Weeks    Status Achieved    Target Date 02/12/20      PT SHORT TERM GOAL #3   Title Berg Balance >40 / 56    Baseline MET 02/16/2020    Time 4    Period Weeks    Status Achieved    Target Date 02/12/20      PT SHORT TERM GOAL  #4   Title Patient ambulates 350' with cane & prosthesis with supervision.    Baseline MET 02/16/2020    Time 4    Period Weeks    Status Achieved    Target Date 02/12/20      PT SHORT TERM GOAL #5   Title Patient negotiates ramps & curbs with cane with  supervision and stairs with 1 rail/cane with supervision with prosthesis.    Baseline MET 02/16/2020    Time 4    Period Weeks    Status Achieved    Target Date 02/12/20             PT Long Term Goals - 12/15/19 1459      PT LONG TERM GOAL #1   Title Patient verbalizes & demonstrates understanding of proper prosthetic care to enable safe utilization of prosthesis.  (All LTGs Target Date: 03/11/2020)    Time 12    Period Weeks    Status On-going    Target Date 03/11/20      PT LONG TERM  GOAL #2   Title Patient tolerates prosthesis wear >90% of awake hours without skin issues & limb pain </= 2/10 to enable function throughout his day.    Time 12    Period Weeks    Status On-going    Target Date 03/11/20      PT LONG TERM GOAL #3   Title Berg Balance with prosthesis >/= 45/56 indicating lower fall risk.    Time 12    Period Weeks    Status On-going    Target Date 03/11/20      PT LONG TERM GOAL #4   Title Patient ambulates 500' with cane or less & prosthesis modified independent for community mobility.    Baseline 12/15/2019 not met    Time 12    Period Weeks    Status On-going    Target Date 03/11/20      PT LONG TERM GOAL #5   Title Patient negotiates ramps, curbs & stairs with single rail with cane or less and prosthesis modified independent to enable community access.    Time 12    Period Weeks    Status On-going    Target Date 03/11/20      PT LONG TERM GOAL #6   Title Patient reports back & residual limb pain increases </= 2 increments on 0-10 scale with standing & gait activities.    Time 12    Period Weeks    Status On-going    Target Date 03/11/20                 Plan - 02/16/20 1351     Clinical Impression Statement Patient met all STGs and is progressing towards LTGs.  He improved his mobility with cane and balance reactions.    Personal Factors and Comorbidities Comorbidity 3+;Fitness;Time since onset of injury/illness/exacerbation;Past/Current Experience    Comorbidities Rt TTA, left 2nd & 3rd toe amp 08/19/2018, ESRD, HTN, CHF, aortic stenosis, left fem-pop bypass, peripheral neuropathy, back spasms    Examination-Activity Limitations Lift;Locomotion Level;Stairs;Stand;Transfers    Examination-Participation Restrictions Community Activity;Shop    Stability/Clinical Decision Making Evolving/Moderate complexity    Rehab Potential Good    PT Frequency 2x / week    PT Duration 12 weeks    PT Treatment/Interventions ADLs/Self Care Home Management;Canalith Repostioning;DME Instruction;Gait training;Stair training;Functional mobility training;Therapeutic activities;Therapeutic exercise;Balance training;Neuromuscular re-education;Patient/family education;Prosthetic Training;Manual techniques;Scar mobilization;Passive range of motion;Dry needling;Joint Manipulations;Vestibular    PT Next Visit Plan work towards [place],  progress balance & gait with TTA prosthesis    PT Home Exercise Plan .ampsink, see pt instructions section on 09/24/19 note  Access Code: [email or web address]    Consulted and Agree with Plan of Care Patient           Patient will benefit from skilled therapeutic intervention in order to improve the following deficits and impairments:  Abnormal gait, Decreased activity tolerance, Decreased balance, Decreased endurance, Decreased knowledge of use of DME, Decreased mobility, Decreased range of motion, Decreased skin integrity, Decreased scar mobility, Decreased strength, Dizziness, Increased edema, Postural dysfunction, Prosthetic Dependency, Pain  Visit Diagnosis: Muscle weakness (generalized)  Unsteadiness on feet  Other abnormalities of gait and mobility  Abnormal  posture  Stiffness of left knee, not elsewhere classified  Stiffness of right knee, not elsewhere classified  Chronic bilateral low back pain without sciatica  History of falling     Problem List Patient Active Problem List   Diagnosis Date Noted  . Neuropathy 10/06/2019  . Gait abnormality 08/06/2019  . Back muscle  spasm 07/09/2019  . Sleep disturbance   . Nausea without vomiting   . Orthostatic hypotension   . Acute on chronic anemia   . Chronic combined systolic and diastolic congestive heart failure ([place])   . ESRD on dialysis ([place])   . Right below-knee amputee ([place]) 05/18/2019  . Pressure injury of skin 05/14/2019  . Enterococcal bacteremia 05/13/2019  . Polymorphic ventricular tachycardia ([place]) 10/24/2018  . S/P TAVR (transcatheter aortic valve replacement) 10/21/2018  . Anxiety   . Coronary artery disease involving native coronary artery of native heart without angina pectoris   . Thrombocytopenia ([place])   . PAD (peripheral artery disease) ([place]) 07/22/2018  . Acute on chronic combined systolic and diastolic CHF (congestive heart failure) ([place]) 11/30/2017  . ESRD (end stage renal disease) on dialysis ([place])   . Iron deficiency anemia 11/22/2015  . AL amyloidosis ([place]) 09/25/2014  . Anemia of chronic disease 09/25/2014  . Essential hypertension, benign 11/06/2013    Jamey Reas PT, DPT 02/16/2020, 2:29 PM  [facility] Physical Therapy [address] [place], [place], [address] Phone: [phone]   Fax:  [phone]  Name: Jose Garrett MRN: [id number] Date of Birth: February 01, 1951

## 2020-02-18 ENCOUNTER — Other Ambulatory Visit: Payer: Self-pay

## 2020-02-18 ENCOUNTER — Encounter: Payer: Self-pay | Admitting: Physical Therapy

## 2020-02-18 ENCOUNTER — Ambulatory Visit (INDEPENDENT_AMBULATORY_CARE_PROVIDER_SITE_OTHER): Payer: Medicare Other | Admitting: Physical Therapy

## 2020-02-18 DIAGNOSIS — M545 Low back pain, unspecified: Secondary | ICD-10-CM

## 2020-02-18 DIAGNOSIS — R2681 Unsteadiness on feet: Secondary | ICD-10-CM | POA: Diagnosis not present

## 2020-02-18 DIAGNOSIS — G8929 Other chronic pain: Secondary | ICD-10-CM

## 2020-02-18 DIAGNOSIS — M6281 Muscle weakness (generalized): Secondary | ICD-10-CM | POA: Diagnosis not present

## 2020-02-18 DIAGNOSIS — R2689 Other abnormalities of gait and mobility: Secondary | ICD-10-CM | POA: Diagnosis not present

## 2020-02-18 DIAGNOSIS — M25661 Stiffness of right knee, not elsewhere classified: Secondary | ICD-10-CM

## 2020-02-18 DIAGNOSIS — R293 Abnormal posture: Secondary | ICD-10-CM

## 2020-02-18 DIAGNOSIS — M25662 Stiffness of left knee, not elsewhere classified: Secondary | ICD-10-CM

## 2020-02-18 NOTE — Therapy (Signed)
[facility] Physical Therapy [address] [place], [place], [address] Phone: [phone]   Fax:  [phone]  Physical Therapy Treatment  Patient Details  Name: Jose Garrett MRN: [id number] Date of Birth: 1951-01-21 Referring Provider (PT): Curt Jews, MD   Encounter Date: 02/18/2020   PT End of Session - 02/18/20 1023    Visit Number 24    Number of Visits 39    Date for PT Re-Evaluation 03/11/20    Authorization Type [employer] Medicare    Authorization Time Period $30 co-pay    Progress Note Due on Visit 30    PT Start Time 1020    PT Stop Time 1100    PT Time Calculation (min) 40 min    Equipment Utilized During Treatment Gait belt    Activity Tolerance Patient tolerated treatment well    Behavior During Therapy WFL for tasks assessed/performed           Past Medical History:  Diagnosis Date  . Amyloidosis ([place])   . Anemia of chronic disease   . Anxiety   . CAD (coronary artery disease)    a. cardiac cath on 11/25/17 with occlusion of the RCA which could have been his event but the RCA could not be engaged. The RCA filled distally from left to right collaterals. There were no severe stenoses in the left coronary system. Medical therapy recommended, started on plavix.  . Chronic combined systolic and diastolic CHF (congestive heart failure) ([place])    a. EF dropped to 35-40% with grade 2 DD by echo 11/2017.  [name] DJD (degenerative joint disease), lumbar   . ESRD (end stage renal disease) ([place])    ESRD- HEMO MWF- [employer]  . GERD (gastroesophageal reflux disease)   . Hyperlipidemia   . Hypertension   . Myocardial infarction ([place])   . Neuropathy   . Peripheral vascular disease ([place])   . Pneumonia   . Restless legs   . S/P TAVR (transcatheter aortic valve replacement) 10/21/2018   26 mm Edwards Sapien 3 Ultra transcatheter heart valve placed via percutaneous right transfemoral approach   . Severe aortic stenosis    severe    Past Surgical History:   Procedure Laterality Date  . ABDOMINAL AORTOGRAM W/LOWER EXTREMITY Bilateral 07/16/2018   Procedure: ABDOMINAL AORTOGRAM W/LOWER EXTREMITY;  Surgeon: Wellington Hampshire, MD;  Location: [place] [facility];  Service: Cardiovascular;  Laterality: Bilateral;  . ABDOMINAL AORTOGRAM W/LOWER EXTREMITY N/A 04/09/2019   Procedure: ABDOMINAL AORTOGRAM W/LOWER EXTREMITY - Right Lower;  Surgeon: Waynetta Sandy, MD;  Location: [place] [facility];  Service: Cardiovascular;  Laterality: N/A;  . AMPUTATION Left 08/19/2018   Procedure: AMPUTATION LEFT TOES SECOND AND THIRD;  Surgeon: Waynetta Sandy, MD;  Location: [place];  Service: Vascular;  Laterality: Left;  . AMPUTATION Right 05/07/2019   Procedure: AMPUTATION BELOW KNEE;  Surgeon: Serafina Mitchell, MD;  Location: [facility];  Service: Vascular;  Laterality: Right;  . AV FISTULA PLACEMENT Right 12/27/2015   Procedure: Right Arm ARTERIOVENOUS (AV) FISTULA CREATION;  Surgeon: Angelia Mould, MD;  Location: [place];  Service: Vascular;  Laterality: Right;  . CATARACT EXTRACTION W/ INTRAOCULAR LENS  IMPLANT, BILATERAL    . COLONOSCOPY    . ERCP W/ SPHICTEROTOMY    . FEMORAL-POPLITEAL BYPASS GRAFT Left 07/22/2018   Procedure: left FEMORAL TO BELOW KNEE POPLITEAL ARTERY BYPASS GREAFT using 27mm removable ring propaten gore graft;  Surgeon: Waynetta Sandy, MD;  Location: [place];  Service:  Vascular;  Laterality: Left;  . FEMORAL-POPLITEAL BYPASS GRAFT Right 04/10/2019   Procedure: BYPASS GRAFT RIGHT FEMORAL-POPLITEAL ARTERY;  Surgeon: Rosetta Posner, MD;  Location: [place];  Service: Vascular;  Laterality: Right;  . I & D EXTREMITY Right 05/05/2019   Procedure: Revision of  RIGHT  FOOT Amputation;  Surgeon: Waynetta Sandy, MD;  Location: [place];  Service: Vascular;  Laterality: Right;  . INTRAOPERATIVE TRANSTHORACIC ECHOCARDIOGRAM N/A 10/21/2018   Procedure: Intraoperative Transthoracic Echocardiogram;  Surgeon: Sherren Mocha, MD;   Location: [place];  Service: Open Heart Surgery;  Laterality: N/A;  . IR GENERIC HISTORICAL  01/27/2016   IR FLUORO GUIDE CV LINE RIGHT 01/27/2016 Arne Cleveland, MD [facility]  . IR GENERIC HISTORICAL  01/27/2016   IR US GUIDE VASC ACCESS RIGHT 01/27/2016 Arne Cleveland, MD [facility]  . LAPAROSCOPIC CHOLECYSTECTOMY  03/2011  . LEFT HEART CATH AND CORONARY ANGIOGRAPHY N/A 11/25/2017   Procedure: LEFT HEART CATH AND CORONARY ANGIOGRAPHY;  Surgeon: Martinique, Peter M, MD;  Location: [place] [facility];  Service: Cardiovascular;  Laterality: N/A;  . LEFT HEART CATH AND CORONARY ANGIOGRAPHY N/A 07/24/2018   Procedure: LEFT HEART CATH AND CORONARY ANGIOGRAPHY;  Surgeon: Sherren Mocha, MD;  Location: [place] [facility];  Service: Cardiovascular;  Laterality: N/A;  . RENAL BIOPSY, PERCUTANEOUS Right 09/08/2014  . RIGHT/LEFT HEART CATH AND CORONARY ANGIOGRAPHY N/A 03/25/2018   Procedure: RIGHT/LEFT HEART CATH AND CORONARY ANGIOGRAPHY;  Surgeon: Martinique, Peter M, MD;  Location: [place] [facility];  Service: Cardiovascular;  Laterality: N/A;  . TEE WITHOUT CARDIOVERSION N/A 05/14/2019   Procedure: TRANSESOPHAGEAL ECHOCARDIOGRAM (TEE);  Surgeon: Fay Records, MD;  Location:  [facility];  Service: Cardiovascular;  Laterality: N/A;  . TOE AMPUTATION Left 08/19/2018   2nd & 3rd toes  . TONSILLECTOMY  ~ 1960  . TRANSCATHETER AORTIC VALVE REPLACEMENT, TRANSFEMORAL N/A 10/21/2018   Procedure: TRANSCATHETER AORTIC VALVE REPLACEMENT, TRANSFEMORAL;  Surgeon: Sherren Mocha, MD;  Location: [facility];  Service: Open Heart Surgery;  Laterality: N/A;  . TRANSMETATARSAL AMPUTATION Right 04/29/2019   Procedure: RIGHT TRANSMETATARSAL AMPUTATION;  Surgeon: Waynetta Sandy, MD;  Location: [place];  Service: Vascular;  Laterality: Right;  . ULTRASOUND GUIDANCE FOR VASCULAR ACCESS  03/25/2018   Procedure: Ultrasound Guidance For Vascular Access;  Surgeon: Martinique, Peter M, MD;  Location: [place] [facility];  Service:  Cardiovascular;;    There were no vitals filed for this visit.   Subjective Assessment - 02/18/20 1020    Subjective Dialysis is trying to adjust his dry weight. He verbalizes that his prosthesis should not be included in dry weight but subtracted from weigh-in & weigh-out.  He almost fell getting out of bed reaching to floor with RLE without prosthesis but caught / corrected himself before falling.    Patient is accompained by: Family member   dtr, Alysia Penna   Pertinent History Rt TTA, left 2nd & 3rd toe amp 08/19/2018, ESRD, HTN, CHF, aortic stenosis, left fem-pop bypass, peripheral neuropathy, back spasms,    Patient Stated Goals use prosthesis to be active in community, working on car, day travels, walks    Currently in Pain? Yes    Pain Score 6     Pain Location Back    Pain Orientation Lower    Pain Descriptors / Indicators Spasm    Pain Type Chronic pain    Pain Onset More than a month ago    Pain Frequency Constant    Aggravating Factors  movements, standing &  walking    Pain Relieving Factors injections,    Pain Onset More than a month ago                             [facility] Adult PT Treatment/Exercise - 02/18/20 1020      Transfers   Sit to Stand 5: Supervision;With upper extremity assist;With armrests;From chair/3-in-1;Other (comment)    Stand to Sit 5: Supervision;With armrests;To chair/3-in-1;With upper extremity assist;Other (comment)      Ambulation/Gait   Ambulation/Gait Yes    Ambulation/Gait Assistance 5: Supervision    Ambulation Distance (Feet) 200 Feet    Assistive device Straight cane;Prosthesis   stand alone tip on cane   Stairs --    Stairs Assistance --    Stair Management Technique --    Number of Stairs --    Ramp 4: Min assist   min guard cane & TTA prosthesis   Ramp Details (indicate cue type and reason) tactile & verbal cues on technique    Curb 4: Min assist   min guard, cane & TTA prosthesis   Curb Details (indicate cue type and  reason) tactile & verbal cues on technique      High Level Balance   High Level Balance Activities Side stepping;Backward walking    High Level Balance Comments cane & TTA prosthesis, cues on technique.       Neuro Re-ed    Neuro Re-ed Details  standing with reciprocal UEs & BUEs red theraband rows & forward punch 10 reps ea.       Knee/Hip Exercises: Aerobic   Nustep Level 7 for 8 min with BLEs & BUEs      Knee/Hip Exercises: Machines for Strengthening   Cybex Leg Press Shuttle leg press 75# BLEs 15 reps 2 sets      Knee/Hip Exercises: Standing   Hip Flexion Right;Left;Stengthening;10 reps;Knee straight   RW support, red theraband   Hip Flexion Limitations tactile & verbal cues on technique, working on balance & core stabilization in upright posture    Hip ADduction Strengthening;Right;Left;10 reps   RW support, red theraband   Hip ADduction Limitations ctile & verbal cues on technique, working on balance & core stabilization in upright posture    Hip Abduction Stengthening;Right;Left;10 reps;Knee straight   RW support, red theraband   Abduction Limitations ctile & verbal cues on technique, working on balance & core stabilization in upright posture    Hip Extension Stengthening;Right;Left;10 reps;Knee straight   RW support, red theraband   Extension Limitations ctile & verbal cues on technique, working on balance & core stabilization in upright posture      Prosthetics   Prosthetic Care Comments  --    Current prosthetic wear tolerance (days/week)  daily    Current prosthetic wear tolerance (#hours/day)  reports most of awake hours. Sleeps after dialysis so decreased time but % is most of awake hours.     Current prosthetic weight-bearing tolerance (hours/day)  Patient tolerated 10 min of standing with partial weight on prosthesis without pain    Residual limb condition  callous over patella. No other skin issues.      Education Provided --                    PT Short  Term Goals - 02/16/20 1426      PT SHORT TERM GOAL #1   Title Patient verbalizes understanding of adjusting ply socks with limb  volume changes including with dialysis.  (All STGs Target Date: 02/12/2020)    Baseline MET 02/16/2020    Time 4    Period Weeks    Status Achieved    Target Date 02/12/20      PT SHORT TERM GOAL #2   Title Patient reports back pain increases </= 3 increments on 0-10 scale.    Baseline MET 02/16/2020    Time 4    Period Weeks    Status Achieved    Target Date 02/12/20      PT SHORT TERM GOAL #3   Title Berg Balance >40 / 56    Baseline MET 02/16/2020    Time 4    Period Weeks    Status Achieved    Target Date 02/12/20      PT SHORT TERM GOAL #4   Title Patient ambulates 350' with cane & prosthesis with supervision.    Baseline MET 02/16/2020    Time 4    Period Weeks    Status Achieved    Target Date 02/12/20      PT SHORT TERM GOAL #5   Title Patient negotiates ramps & curbs with cane with  supervision and stairs with 1 rail/cane with supervision with prosthesis.    Baseline MET 02/16/2020    Time 4    Period Weeks    Status Achieved    Target Date 02/12/20             PT Long Term Goals - 12/15/19 1459      PT LONG TERM GOAL #1   Title Patient verbalizes & demonstrates understanding of proper prosthetic care to enable safe utilization of prosthesis.  (All LTGs Target Date: 03/11/2020)    Time 12    Period Weeks    Status On-going    Target Date 03/11/20      PT LONG TERM GOAL #2   Title Patient tolerates prosthesis wear >90% of awake hours without skin issues & limb pain </= 2/10 to enable function throughout his day.    Time 12    Period Weeks    Status On-going    Target Date 03/11/20      PT LONG TERM GOAL #3   Title Berg Balance with prosthesis >/= 45/56 indicating lower fall risk.    Time 12    Period Weeks    Status On-going    Target Date 03/11/20      PT LONG TERM GOAL #4   Title Patient ambulates 500' with cane or  less & prosthesis modified independent for community mobility.    Baseline 12/15/2019 not met    Time 12    Period Weeks    Status On-going    Target Date 03/11/20      PT LONG TERM GOAL #5   Title Patient negotiates ramps, curbs & stairs with single rail with cane or less and prosthesis modified independent to enable community access.    Time 12    Period Weeks    Status On-going    Target Date 03/11/20      PT LONG TERM GOAL #6   Title Patient reports back & residual limb pain increases </= 2 increments on 0-10 scale with standing & gait activities.    Time 12    Period Weeks    Status On-going    Target Date 03/11/20                 Plan - 02/18/20 1024  Clinical Impression Statement PT progressed exercises to standing UE & LE resistive to work on balance, core stabilization & standing tolerance.  He reported after session was good workout and no increase back pain.    Personal Factors and Comorbidities Comorbidity 3+;Fitness;Time since onset of injury/illness/exacerbation;Past/Current Experience    Comorbidities Rt TTA, left 2nd & 3rd toe amp 08/19/2018, ESRD, HTN, CHF, aortic stenosis, left fem-pop bypass, peripheral neuropathy, back spasms    Examination-Activity Limitations Lift;Locomotion Level;Stairs;Stand;Transfers    Examination-Participation Restrictions Community Activity;Shop    Stability/Clinical Decision Making Evolving/Moderate complexity    Rehab Potential Good    PT Frequency 2x / week    PT Duration 12 weeks    PT Treatment/Interventions ADLs/Self Care Home Management;Canalith Repostioning;DME Instruction;Gait training;Stair training;Functional mobility training;Therapeutic activities;Therapeutic exercise;Balance training;Neuromuscular re-education;Patient/family education;Prosthetic Training;Manual techniques;Scar mobilization;Passive range of motion;Dry needling;Joint Manipulations;Vestibular    PT Next Visit Plan work towards [place],  progress balance &  gait with TTA prosthesis    PT Home Exercise Plan .ampsink, see pt instructions section on 09/24/19 note  Access Code: [email or web address]    Consulted and Agree with Plan of Care Patient           Patient will benefit from skilled therapeutic intervention in order to improve the following deficits and impairments:  Abnormal gait,Decreased activity tolerance,Decreased balance,Decreased endurance,Decreased knowledge of use of DME,Decreased mobility,Decreased range of motion,Decreased skin integrity,Decreased scar mobility,Decreased strength,Dizziness,Increased edema,Postural dysfunction,Prosthetic Dependency,Pain  Visit Diagnosis: Muscle weakness (generalized)  Unsteadiness on feet  Other abnormalities of gait and mobility  Abnormal posture  Stiffness of left knee, not elsewhere classified  Stiffness of right knee, not elsewhere classified  Chronic bilateral low back pain without sciatica     Problem List Patient Active Problem List   Diagnosis Date Noted  . Neuropathy 10/06/2019  . Gait abnormality 08/06/2019  . Back muscle spasm 07/09/2019  . Sleep disturbance   . Nausea without vomiting   . Orthostatic hypotension   . Acute on chronic anemia   . Chronic combined systolic and diastolic congestive heart failure ([place])   . ESRD on dialysis ([place])   . Right below-knee amputee ([place]) 05/18/2019  . Pressure injury of skin 05/14/2019  . Enterococcal bacteremia 05/13/2019  . Polymorphic ventricular tachycardia ([place]) 10/24/2018  . S/P TAVR (transcatheter aortic valve replacement) 10/21/2018  . Anxiety   . Coronary artery disease involving native coronary artery of native heart without angina pectoris   . Thrombocytopenia ([place])   . PAD (peripheral artery disease) ([place]) 07/22/2018  . Acute on chronic combined systolic and diastolic CHF (congestive heart failure) ([place]) 11/30/2017  . ESRD (end stage renal disease) on dialysis ([place])   . Iron deficiency anemia 11/22/2015  . AL amyloidosis  ([place]) 09/25/2014  . Anemia of chronic disease 09/25/2014  . Essential hypertension, benign 11/06/2013    Jamey Reas PT, DPT 02/18/2020, 12:08 PM  [facility] Physical Therapy [address] [place], [place], [address] Phone: [phone]   Fax:  [phone]  Name: Jose Garrett MRN: [id number] Date of Birth: 19-Jul-1950

## 2020-02-23 ENCOUNTER — Ambulatory Visit (INDEPENDENT_AMBULATORY_CARE_PROVIDER_SITE_OTHER): Payer: Medicare Other | Admitting: Physical Therapy

## 2020-02-23 ENCOUNTER — Other Ambulatory Visit: Payer: Self-pay

## 2020-02-23 ENCOUNTER — Encounter: Payer: Self-pay | Admitting: Physical Therapy

## 2020-02-23 DIAGNOSIS — M545 Low back pain, unspecified: Secondary | ICD-10-CM

## 2020-02-23 DIAGNOSIS — G8929 Other chronic pain: Secondary | ICD-10-CM

## 2020-02-23 DIAGNOSIS — R293 Abnormal posture: Secondary | ICD-10-CM

## 2020-02-23 DIAGNOSIS — M25662 Stiffness of left knee, not elsewhere classified: Secondary | ICD-10-CM

## 2020-02-23 DIAGNOSIS — R2689 Other abnormalities of gait and mobility: Secondary | ICD-10-CM

## 2020-02-23 DIAGNOSIS — Z9181 History of falling: Secondary | ICD-10-CM

## 2020-02-23 DIAGNOSIS — R2681 Unsteadiness on feet: Secondary | ICD-10-CM | POA: Diagnosis not present

## 2020-02-23 DIAGNOSIS — M6281 Muscle weakness (generalized): Secondary | ICD-10-CM | POA: Diagnosis not present

## 2020-02-23 DIAGNOSIS — M25661 Stiffness of right knee, not elsewhere classified: Secondary | ICD-10-CM

## 2020-02-23 NOTE — Therapy (Signed)
[facility] Physical Therapy [address] [place], [place], [address] Phone: [phone]   Fax:  [phone]  Physical Therapy Treatment  Patient Details  Name: Jose Garrett MRN: [id number] Date of Birth: [birth year] Referring Provider (PT): Curt Jews, MD   Encounter Date: 02/23/2020   PT End of Session - 02/23/20 1428    Visit Number 25    Number of Visits 39    Date for PT Re-Evaluation 03/11/20    Authorization Type [employer] Medicare    Authorization Time Period $30 co-pay    Progress Note Due on Visit 30    PT Start Time 1430    PT Stop Time 1512    PT Time Calculation (min) 42 min    Equipment Utilized During Treatment Gait belt    Activity Tolerance Patient tolerated treatment well    Behavior During Therapy WFL for tasks assessed/performed           Past Medical History:  Diagnosis Date  . Amyloidosis ([place])   . Anemia of chronic disease   . Anxiety   . CAD (coronary artery disease)    a. cardiac cath on 11/25/17 with occlusion of the RCA which could have been his event but the RCA could not be engaged. The RCA filled distally from left to right collaterals. There were no severe stenoses in the left coronary system. Medical therapy recommended, started on plavix.  . Chronic combined systolic and diastolic CHF (congestive heart failure) ([place])    a. EF dropped to 35-40% with grade 2 DD by echo 11/2017.  [name] DJD (degenerative joint disease), lumbar   . ESRD (end stage renal disease) ([place])    ESRD- HEMO MWF- [employer]  . GERD (gastroesophageal reflux disease)   . Hyperlipidemia   . Hypertension   . Myocardial infarction ([place])   . Neuropathy   . Peripheral vascular disease ([place])   . Pneumonia   . Restless legs   . S/P TAVR (transcatheter aortic valve replacement) 10/21/2018   26 mm Edwards Sapien 3 Ultra transcatheter heart valve placed via percutaneous right transfemoral approach   . Severe aortic stenosis    severe    Past Surgical History:   Procedure Laterality Date  . ABDOMINAL AORTOGRAM W/LOWER EXTREMITY Bilateral 07/16/2018   Procedure: ABDOMINAL AORTOGRAM W/LOWER EXTREMITY;  Surgeon: Wellington Hampshire, MD;  Location: [facility];  Service: Cardiovascular;  Laterality: Bilateral;  . ABDOMINAL AORTOGRAM W/LOWER EXTREMITY N/A 04/09/2019   Procedure: ABDOMINAL AORTOGRAM W/LOWER EXTREMITY - Right Lower;  Surgeon: Waynetta Sandy, MD;  Location: [facility];  Service: Cardiovascular;  Laterality: N/A;  . AMPUTATION Left 08/19/2018   Procedure: AMPUTATION LEFT TOES SECOND AND THIRD;  Surgeon: Waynetta Sandy, MD;  Location: [facility] [place];  Service: Vascular;  Laterality: Left;  . AMPUTATION Right 05/07/2019   Procedure: AMPUTATION BELOW KNEE;  Surgeon: Serafina Mitchell, MD;  Location: [facility] OR;  Service: Vascular;  Laterality: Right;  . AV FISTULA PLACEMENT Right 12/27/2015   Procedure: Right Arm ARTERIOVENOUS (AV) FISTULA CREATION;  Surgeon: Angelia Mould, MD;  Location: [place];  Service: Vascular;  Laterality: Right;  . CATARACT EXTRACTION W/ INTRAOCULAR LENS  IMPLANT, BILATERAL    . COLONOSCOPY    . ERCP W/ SPHICTEROTOMY    . FEMORAL-POPLITEAL BYPASS GRAFT Left 07/22/2018   Procedure: left FEMORAL TO BELOW KNEE POPLITEAL ARTERY BYPASS GREAFT using 28mm removable ring propaten gore graft;  Surgeon: Waynetta Sandy, MD;  Location: [place];  Service:  Vascular;  Laterality: Left;  . FEMORAL-POPLITEAL BYPASS GRAFT Right 04/10/2019   Procedure: BYPASS GRAFT RIGHT FEMORAL-POPLITEAL ARTERY;  Surgeon: Rosetta Posner, MD;  Location: [facility];  Service: Vascular;  Laterality: Right;  . I & D EXTREMITY Right 05/05/2019   Procedure: Revision of  RIGHT  FOOT Amputation;  Surgeon: Waynetta Sandy, MD;  Location: [place];  Service: Vascular;  Laterality: Right;  . INTRAOPERATIVE TRANSTHORACIC ECHOCARDIOGRAM N/A 10/21/2018   Procedure: Intraoperative Transthoracic Echocardiogram;  Surgeon: Sherren Mocha, MD;   Location: [place];  Service: Open Heart Surgery;  Laterality: N/A;  . IR GENERIC HISTORICAL  01/27/2016   IR FLUORO GUIDE CV LINE RIGHT 01/27/2016 Arne Cleveland, MD [facility]  . IR GENERIC HISTORICAL  01/27/2016   IR US GUIDE VASC ACCESS RIGHT 01/27/2016 Arne Cleveland, MD [facility]  . LAPAROSCOPIC CHOLECYSTECTOMY  03/2011  . LEFT HEART CATH AND CORONARY ANGIOGRAPHY N/A 11/25/2017   Procedure: LEFT HEART CATH AND CORONARY ANGIOGRAPHY;  Surgeon: Martinique, Peter M, MD;  Location: [facility];  Service: Cardiovascular;  Laterality: N/A;  . LEFT HEART CATH AND CORONARY ANGIOGRAPHY N/A 07/24/2018   Procedure: LEFT HEART CATH AND CORONARY ANGIOGRAPHY;  Surgeon: Sherren Mocha, MD;  Location: [facility];  Service: Cardiovascular;  Laterality: N/A;  . RENAL BIOPSY, PERCUTANEOUS Right 09/08/2014  . RIGHT/LEFT HEART CATH AND CORONARY ANGIOGRAPHY N/A 03/25/2018   Procedure: RIGHT/LEFT HEART CATH AND CORONARY ANGIOGRAPHY;  Surgeon: Martinique, Peter M, MD;  Location: [facility];  Service: Cardiovascular;  Laterality: N/A;  . TEE WITHOUT CARDIOVERSION N/A 05/14/2019   Procedure: TRANSESOPHAGEAL ECHOCARDIOGRAM (TEE);  Surgeon: Fay Records, MD;  Location: [facility] ENDOSCOPY;  Service: Cardiovascular;  Laterality: N/A;  . TOE AMPUTATION Left 08/19/2018   2nd & 3rd toes  . TONSILLECTOMY  ~ 1960  . TRANSCATHETER AORTIC VALVE REPLACEMENT, TRANSFEMORAL N/A 10/21/2018   Procedure: TRANSCATHETER AORTIC VALVE REPLACEMENT, TRANSFEMORAL;  Surgeon: Sherren Mocha, MD;  Location: [facility] [place];  Service: Open Heart Surgery;  Laterality: N/A;  . TRANSMETATARSAL AMPUTATION Right 04/29/2019   Procedure: RIGHT TRANSMETATARSAL AMPUTATION;  Surgeon: Waynetta Sandy, MD;  Location: [facility];  Service: Vascular;  Laterality: Right;  . ULTRASOUND GUIDANCE FOR VASCULAR ACCESS  03/25/2018   Procedure: Ultrasound Guidance For Vascular Access;  Surgeon: Martinique, Peter M, MD;  Location: [facility] [facility];  Service:  Cardiovascular;;    There were no vitals filed for this visit.   Subjective Assessment - 02/23/20 1429    Subjective He goes on Thursday to check for plan with back injection.  the prosthesis is doing well.    Patient is accompained by: Family member   dtr, Alysia Penna   Pertinent History Rt TTA, left 2nd & 3rd toe amp 08/19/2018, ESRD, HTN, CHF, aortic stenosis, left fem-pop bypass, peripheral neuropathy, back spasms,    Patient Stated Goals use prosthesis to be active in community, working on car, day travels, walks    Currently in Pain? Yes    Pain Score 6     Pain Location Back    Pain Orientation Lower    Pain Descriptors / Indicators Spasm    Pain Type Chronic pain    Pain Onset More than a month ago    Pain Frequency Constant    Aggravating Factors  standing & walking,  staying in one position too long    Pain Relieving Factors injections, medications    Pain Onset More than a month ago  [place] Adult PT Treatment/Exercise - 02/23/20 1429      Transfers   Sit to Stand 5: Supervision;With upper extremity assist;With armrests;From chair/3-in-1;Other (comment)    Stand to Sit 5: Supervision;With armrests;To chair/3-in-1;With upper extremity assist;Other (comment)      Ambulation/Gait   Ambulation/Gait Yes    Ambulation/Gait Assistance 5: Supervision    Ambulation Distance (Feet) 200 Feet    Assistive device Straight cane;Prosthesis   stand alone tip on cane   Ramp 4: Min assist   min guard cane & TTA prosthesis   Curb 4: Min assist   min guard, cane & TTA prosthesis     High Level Balance   High Level Balance Activities Side stepping;Backward walking    High Level Balance Comments cane & TTA prosthesis, cues on technique.       Neuro Re-ed    Neuro Re-ed Details  standing with reciprocal UEs & BUEs red theraband rows & forward punch 10 reps ea.       Knee/Hip Exercises: Aerobic   Nustep Level 7 for 8 min with BLEs & BUEs       Knee/Hip Exercises: Machines for Strengthening   Cybex Leg Press Shuttle leg press 75# BLEs 15 reps 2 sets      Knee/Hip Exercises: Standing   Hip Flexion Right;Left;Stengthening;10 reps;Knee straight   cane / bar support, red theraband   Hip Flexion Limitations tactile & verbal cues on technique, working on balance & core stabilization in upright posture    Hip ADduction Strengthening;Right;Left;10 reps   cane / bar support, red theraband   Hip ADduction Limitations tactile & verbal cues on technique, working on balance & core stabilization in upright posture    Hip Abduction Stengthening;Right;Left;10 reps;Knee straight   cane / bar support, red theraband   Abduction Limitations tactile & verbal cues on technique, working on balance & core stabilization in upright posture    Hip Extension Stengthening;Right;Left;10 reps;Knee straight   cane / bar support, red theraband   Extension Limitations tactile & verbal cues on technique, working on balance & core stabilization in upright posture      Prosthetics   Current prosthetic wear tolerance (days/week)  daily    Current prosthetic wear tolerance (#hours/day)  reports most of awake hours. Sleeps after dialysis so decreased time but % is most of awake hours.     Current prosthetic weight-bearing tolerance (hours/day)  Patient tolerated 10 min of standing with partial weight on prosthesis without pain    Residual limb condition  callous over patella. No other skin issues.                      PT Short Term Goals - 02/16/20 1426      PT SHORT TERM GOAL #1   Title Patient verbalizes understanding of adjusting ply socks with limb volume changes including with dialysis.  (All STGs Target Date: 02/12/2020)    Baseline MET 02/16/2020    Time 4    Period Weeks    Status Achieved    Target Date 02/12/20      PT SHORT TERM GOAL #2   Title Patient reports back pain increases </= 3 increments on 0-10 scale.    Baseline MET 02/16/2020     Time 4    Period Weeks    Status Achieved    Target Date 02/12/20      PT SHORT TERM GOAL #3   Title Berg Balance >40 / 56  Baseline MET 02/16/2020    Time 4    Period Weeks    Status Achieved    Target Date 02/12/20      PT SHORT TERM GOAL #4   Title Patient ambulates 350' with cane & prosthesis with supervision.    Baseline MET 02/16/2020    Time 4    Period Weeks    Status Achieved    Target Date 02/12/20      PT SHORT TERM GOAL #5   Title Patient negotiates ramps & curbs with cane with  supervision and stairs with 1 rail/cane with supervision with prosthesis.    Baseline MET 02/16/2020    Time 4    Period Weeks    Status Achieved    Target Date 02/12/20             PT Long Term Goals - 12/15/19 1459      PT LONG TERM GOAL #1   Title Patient verbalizes & demonstrates understanding of proper prosthetic care to enable safe utilization of prosthesis.  (All LTGs Target Date: 03/11/2020)    Time 12    Period Weeks    Status On-going    Target Date 03/11/20      PT LONG TERM GOAL #2   Title Patient tolerates prosthesis wear >90% of awake hours without skin issues & limb pain </= 2/10 to enable function throughout his day.    Time 12    Period Weeks    Status On-going    Target Date 03/11/20      PT LONG TERM GOAL #3   Title Berg Balance with prosthesis >/= 45/56 indicating lower fall risk.    Time 12    Period Weeks    Status On-going    Target Date 03/11/20      PT LONG TERM GOAL #4   Title Patient ambulates 500' with cane or less & prosthesis modified independent for community mobility.    Baseline 12/15/2019 not met    Time 12    Period Weeks    Status On-going    Target Date 03/11/20      PT LONG TERM GOAL #5   Title Patient negotiates ramps, curbs & stairs with single rail with cane or less and prosthesis modified independent to enable community access.    Time 12    Period Weeks    Status On-going    Target Date 03/11/20      PT LONG TERM GOAL  #6   Title Patient reports back & residual limb pain increases </= 2 increments on 0-10 scale with standing & gait activities.    Time 12    Period Weeks    Status On-going    Target Date 03/11/20                 Plan - 02/23/20 1429    Clinical Impression Statement PT session continues to work on prosthetic gait with cane and standing balance with core stabilization. He is improving and on target to meet LTGs.    Personal Factors and Comorbidities Comorbidity 3+;Fitness;Time since onset of injury/illness/exacerbation;Past/Current Experience    Comorbidities Rt TTA, left 2nd & 3rd toe amp 08/19/2018, ESRD, HTN, CHF, aortic stenosis, left fem-pop bypass, peripheral neuropathy, back spasms    Examination-Activity Limitations Lift;Locomotion Level;Stairs;Stand;Transfers    Examination-Participation Restrictions Community Activity;Shop    Stability/Clinical Decision Making Evolving/Moderate complexity    Rehab Potential Good    PT Frequency 2x / week    PT  Duration 12 weeks    PT Treatment/Interventions ADLs/Self Care Home Management;Canalith Repostioning;DME Instruction;Gait training;Stair training;Functional mobility training;Therapeutic activities;Therapeutic exercise;Balance training;Neuromuscular re-education;Patient/family education;Prosthetic Training;Manual techniques;Scar mobilization;Passive range of motion;Dry needling;Joint Manipulations;Vestibular    PT Next Visit Plan work towards [place],  progress balance & gait with TTA prosthesis    PT Home Exercise Plan .ampsink, see pt instructions section on 09/24/19 note  Access Code: [email or web address]    Consulted and Agree with Plan of Care Patient           Patient will benefit from skilled therapeutic intervention in order to improve the following deficits and impairments:  Abnormal gait,Decreased activity tolerance,Decreased balance,Decreased endurance,Decreased knowledge of use of DME,Decreased mobility,Decreased range of motion,Decreased  skin integrity,Decreased scar mobility,Decreased strength,Dizziness,Increased edema,Postural dysfunction,Prosthetic Dependency,Pain  Visit Diagnosis: Muscle weakness (generalized)  Unsteadiness on feet  Other abnormalities of gait and mobility  Abnormal posture  Stiffness of left knee, not elsewhere classified  Stiffness of right knee, not elsewhere classified  Chronic bilateral low back pain without sciatica  History of falling     Problem List Patient Active Problem List   Diagnosis Date Noted  . Neuropathy 10/06/2019  . Gait abnormality 08/06/2019  . Back muscle spasm 07/09/2019  . Sleep disturbance   . Nausea without vomiting   . Orthostatic hypotension   . Acute on chronic anemia   . Chronic combined systolic and diastolic congestive heart failure ([place])   . ESRD on dialysis ([place])   . Right below-knee amputee ([place]) 05/18/2019  . Pressure injury of skin 05/14/2019  . Enterococcal bacteremia 05/13/2019  . Polymorphic ventricular tachycardia ([place]) 10/24/2018  . S/P TAVR (transcatheter aortic valve replacement) 10/21/2018  . Anxiety   . Coronary artery disease involving native coronary artery of native heart without angina pectoris   . Thrombocytopenia ([place])   . PAD (peripheral artery disease) ([place]) 07/22/2018  . Acute on chronic combined systolic and diastolic CHF (congestive heart failure) ([place]) 11/30/2017  . ESRD (end stage renal disease) on dialysis ([place])   . Iron deficiency anemia 11/22/2015  . AL amyloidosis ([place]) 09/25/2014  . Anemia of chronic disease 09/25/2014  . Essential hypertension, benign 11/06/2013    Jamey Reas, PT, DPT 02/23/2020, 3:14 PM  [facility] Physical [facility] [address] [place], [place], [address] Phone: [phone]   Fax:  [phone]  Name: LENG MONTESDEOCA MRN: [id number] Date of Birth: Feb 24, 1951

## 2020-02-25 ENCOUNTER — Other Ambulatory Visit (HOSPITAL_COMMUNITY): Payer: Self-pay | Admitting: Chiropractic Medicine

## 2020-02-25 ENCOUNTER — Encounter: Payer: Medicare Other | Admitting: Physical Therapy

## 2020-03-01 ENCOUNTER — Encounter: Payer: Self-pay | Admitting: Physical Therapy

## 2020-03-01 ENCOUNTER — Other Ambulatory Visit: Payer: Self-pay

## 2020-03-01 ENCOUNTER — Ambulatory Visit (INDEPENDENT_AMBULATORY_CARE_PROVIDER_SITE_OTHER): Payer: Medicare Other | Admitting: Physical Therapy

## 2020-03-01 DIAGNOSIS — R2681 Unsteadiness on feet: Secondary | ICD-10-CM

## 2020-03-01 DIAGNOSIS — R2689 Other abnormalities of gait and mobility: Secondary | ICD-10-CM | POA: Diagnosis not present

## 2020-03-01 DIAGNOSIS — M25662 Stiffness of left knee, not elsewhere classified: Secondary | ICD-10-CM

## 2020-03-01 DIAGNOSIS — G8929 Other chronic pain: Secondary | ICD-10-CM

## 2020-03-01 DIAGNOSIS — M6281 Muscle weakness (generalized): Secondary | ICD-10-CM | POA: Diagnosis not present

## 2020-03-01 DIAGNOSIS — R293 Abnormal posture: Secondary | ICD-10-CM

## 2020-03-01 DIAGNOSIS — M25661 Stiffness of right knee, not elsewhere classified: Secondary | ICD-10-CM

## 2020-03-01 DIAGNOSIS — M545 Low back pain, unspecified: Secondary | ICD-10-CM

## 2020-03-01 NOTE — Therapy (Signed)
[facility] Physical Therapy [address] [place], [place], [address] Phone: [phone]   Fax:  [phone]  Physical Therapy Treatment  Patient Details  Name: Jose Garrett MRN: [id number] Date of Birth: Mar 24, 1950 Referring Provider (PT): Curt Jews, MD   Encounter Date: 03/01/2020   PT End of Session - 03/01/20 1350    Visit Number 26    Number of Visits 39    Date for PT Re-Evaluation 03/11/20    Authorization Type U[employer] Medicare    Authorization Time Period $30 co-pay    Progress Note Due on Visit 30    PT Start Time 1347    PT Stop Time 1427    PT Time Calculation (min) 40 min    Equipment Utilized During Treatment Gait belt    Activity Tolerance Patient tolerated treatment well    Behavior During Therapy WFL for tasks assessed/performed           Past Medical History:  Diagnosis Date  . Amyloidosis ([place])   . Anemia of chronic disease   . Anxiety   . CAD (coronary artery disease)    a. cardiac cath on 11/25/17 with occlusion of the RCA which could have been his event but the RCA could not be engaged. The RCA filled distally from left to right collaterals. There were no severe stenoses in the left coronary system. Medical therapy recommended, started on plavix.  . Chronic combined systolic and diastolic CHF (congestive heart failure) ([place])    a. EF dropped to 35-40% with grade 2 DD by echo 11/2017.  [name] DJD (degenerative joint disease), lumbar   . ESRD (end stage renal disease) ([place])    ESRD- HEMO MWF- [employer]  . GERD (gastroesophageal reflux disease)   . Hyperlipidemia   . Hypertension   . Myocardial infarction ([place])   . Neuropathy   . Peripheral vascular disease ([place])   . Pneumonia   . Restless legs   . S/P TAVR (transcatheter aortic valve replacement) 10/21/2018   26 mm Edwards Sapien 3 Ultra transcatheter heart valve placed via percutaneous right transfemoral approach   . Severe aortic stenosis    severe    Past Surgical History:   Procedure Laterality Date  . ABDOMINAL AORTOGRAM W/LOWER EXTREMITY Bilateral 07/16/2018   Procedure: ABDOMINAL AORTOGRAM W/LOWER EXTREMITY;  Surgeon: Wellington Hampshire, MD;  Location: [place] [facility];  Service: Cardiovascular;  Laterality: Bilateral;  . ABDOMINAL AORTOGRAM W/LOWER EXTREMITY N/A 04/09/2019   Procedure: ABDOMINAL AORTOGRAM W/LOWER EXTREMITY - Right Lower;  Surgeon: Waynetta Sandy, MD;  Location: [place] [facility];  Service: Cardiovascular;  Laterality: N/A;  . AMPUTATION Left 08/19/2018   Procedure: AMPUTATION LEFT TOES SECOND AND THIRD;  Surgeon: Waynetta Sandy, MD;  Location: [place];  Service: Vascular;  Laterality: Left;  . AMPUTATION Right 05/07/2019   Procedure: AMPUTATION BELOW KNEE;  Surgeon: Serafina Mitchell, MD;  Location: [facility];  Service: Vascular;  Laterality: Right;  . AV FISTULA PLACEMENT Right 12/27/2015   Procedure: Right Arm ARTERIOVENOUS (AV) FISTULA CREATION;  Surgeon: Angelia Mould, MD;  Location: [place];  Service: Vascular;  Laterality: Right;  . CATARACT EXTRACTION W/ INTRAOCULAR LENS  IMPLANT, BILATERAL    . COLONOSCOPY    . ERCP W/ SPHICTEROTOMY    . FEMORAL-POPLITEAL BYPASS GRAFT Left 07/22/2018   Procedure: left FEMORAL TO BELOW KNEE POPLITEAL ARTERY BYPASS GREAFT using 61mm removable ring propaten gore graft;  Surgeon: Waynetta Sandy, MD;  Location: [place];  Service:  Vascular;  Laterality: Left;  . FEMORAL-POPLITEAL BYPASS GRAFT Right 04/10/2019   Procedure: BYPASS GRAFT RIGHT FEMORAL-POPLITEAL ARTERY;  Surgeon: Rosetta Posner, MD;  Location: [place];  Service: Vascular;  Laterality: Right;  . I & D EXTREMITY Right 05/05/2019   Procedure: Revision of  RIGHT  FOOT Amputation;  Surgeon: Waynetta Sandy, MD;  Location: [place];  Service: Vascular;  Laterality: Right;  . INTRAOPERATIVE TRANSTHORACIC ECHOCARDIOGRAM N/A 10/21/2018   Procedure: Intraoperative Transthoracic Echocardiogram;  Surgeon: Sherren Mocha, MD;   Location: [place];  Service: Open Heart Surgery;  Laterality: N/A;  . IR GENERIC HISTORICAL  01/27/2016   IR FLUORO GUIDE CV LINE RIGHT 01/27/2016 Arne Cleveland, MD [facility]  . IR GENERIC HISTORICAL  01/27/2016   IR US GUIDE VASC ACCESS RIGHT 01/27/2016 Arne Cleveland, MD [facility]  . LAPAROSCOPIC CHOLECYSTECTOMY  03/2011  . LEFT HEART CATH AND CORONARY ANGIOGRAPHY N/A 11/25/2017   Procedure: LEFT HEART CATH AND CORONARY ANGIOGRAPHY;  Surgeon: Martinique, Peter M, MD;  Location: [place] [facility];  Service: Cardiovascular;  Laterality: N/A;  . LEFT HEART CATH AND CORONARY ANGIOGRAPHY N/A 07/24/2018   Procedure: LEFT HEART CATH AND CORONARY ANGIOGRAPHY;  Surgeon: Sherren Mocha, MD;  Location: [place] [facility];  Service: Cardiovascular;  Laterality: N/A;  . RENAL BIOPSY, PERCUTANEOUS Right 09/08/2014  . RIGHT/LEFT HEART CATH AND CORONARY ANGIOGRAPHY N/A 03/25/2018   Procedure: RIGHT/LEFT HEART CATH AND CORONARY ANGIOGRAPHY;  Surgeon: Martinique, Peter M, MD;  Location: [place] [facility];  Service: Cardiovascular;  Laterality: N/A;  . TEE WITHOUT CARDIOVERSION N/A 05/14/2019   Procedure: TRANSESOPHAGEAL ECHOCARDIOGRAM (TEE);  Surgeon: Fay Records, MD;  Location: [facility];  Service: Cardiovascular;  Laterality: N/A;  . TOE AMPUTATION Left 08/19/2018   2nd & 3rd toes  . TONSILLECTOMY  ~ 1960  . TRANSCATHETER AORTIC VALVE REPLACEMENT, TRANSFEMORAL N/A 10/21/2018   Procedure: TRANSCATHETER AORTIC VALVE REPLACEMENT, TRANSFEMORAL;  Surgeon: Sherren Mocha, MD;  Location: [place];  Service: Open Heart Surgery;  Laterality: N/A;  . TRANSMETATARSAL AMPUTATION Right 04/29/2019   Procedure: RIGHT TRANSMETATARSAL AMPUTATION;  Surgeon: Waynetta Sandy, MD;  Location: [place];  Service: Vascular;  Laterality: Right;  . ULTRASOUND GUIDANCE FOR VASCULAR ACCESS  03/25/2018   Procedure: Ultrasound Guidance For Vascular Access;  Surgeon: Martinique, Peter M, MD;  Location: [place] [facility];  Service:  Cardiovascular;;    There were no vitals filed for this visit.   Subjective Assessment - 03/01/20 1350    Subjective He is still fighting back trouble / issues with going to [facility] for new program to manage.  He had some difficulty getting prosthesis on his limb.    Patient is accompained by: Family member   dtr, Jose Garrett   Pertinent History Rt TTA, left 2nd & 3rd toe amp 08/19/2018, ESRD, HTN, CHF, aortic stenosis, left fem-pop bypass, peripheral neuropathy, back spasms,    Patient Stated Goals use prosthesis to be active in community, working on car, day travels, walks    Currently in Pain? Yes    Pain Score 6     Pain Location Back    Pain Orientation Lower    Pain Descriptors / Indicators Spasm    Pain Type Chronic pain    Pain Onset More than a month ago    Pain Frequency Constant    Aggravating Factors  activities    Pain Relieving Factors meds &injections    Pain Onset More than a month ago  [place] Adult PT Treatment/Exercise - 03/01/20 1347      Transfers   Sit to Stand 5: Supervision;With upper extremity assist;With armrests;From chair/3-in-1;Other (comment)    Stand to Sit 5: Supervision;With armrests;To chair/3-in-1;With upper extremity assist;Other (comment)      Ambulation/Gait   Ambulation/Gait Yes    Ambulation/Gait Assistance 5: Supervision    Ambulation Distance (Feet) 200 Feet    Assistive device Straight cane;Prosthesis   stand alone tip on cane   Ramp 5: Supervision   min guard cane & TTA prosthesis   Curb 5: Supervision   min guard, cane & TTA prosthesis     High Level Balance   High Level Balance Activities Side stepping;Backward walking    High Level Balance Comments cane & TTA prosthesis, cues on technique.       Neuro Re-ed    Neuro Re-ed Details  standing with reciprocal UEs & BUEs red theraband rows & forward punch 10 reps ea.       Knee/Hip Exercises: Aerobic   Nustep Level 7 for 8 min with BLEs & BUEs       Knee/Hip Exercises: Standing   Hip Flexion Right;Left;Stengthening;10 reps;Knee straight   cane support, red theraband   Hip Flexion Limitations tactile & verbal cues on technique, working on balance & core stabilization in upright posture    Hip ADduction Strengthening;Right;Left;10 reps   cane support, red theraband   Hip ADduction Limitations tactile & verbal cues on technique, working on balance & core stabilization in upright posture    Hip Abduction Stengthening;Right;Left;10 reps;Knee straight   cane support, red theraband   Abduction Limitations tactile & verbal cues on technique, working on balance & core stabilization in upright posture    Hip Extension Stengthening;Right;Left;10 reps;Knee straight   cane support, red theraband   Extension Limitations tactile & verbal cues on technique, working on balance & core stabilization in upright posture      Knee/Hip Exercises: Seated   Sit to [employer] 10 reps;without UE support   from 24" bar stool     Prosthetics   Current prosthetic wear tolerance (days/week)  daily    Current prosthetic wear tolerance (#hours/day)  reports most of awake hours. Sleeps after dialysis so decreased time but % is most of awake hours.     Current prosthetic weight-bearing tolerance (hours/day)  Patient tolerated 10 min of standing with partial weight on prosthesis without pain    Residual limb condition  callous over patella. No other skin issues.                      PT Short Term Goals - 02/16/20 1426      PT SHORT TERM GOAL #1   Title Patient verbalizes understanding of adjusting ply socks with limb volume changes including with dialysis.  (All STGs Target Date: 02/12/2020)    Baseline MET 02/16/2020    Time 4    Period Weeks    Status Achieved    Target Date 02/12/20      PT SHORT TERM GOAL #2   Title Patient reports back pain increases </= 3 increments on 0-10 scale.    Baseline MET 02/16/2020    Time 4    Period Weeks    Status  Achieved    Target Date 02/12/20      PT SHORT TERM GOAL #3   Title Berg Balance >40 / 56    Baseline MET 02/16/2020    Time 4  Period Weeks    Status Achieved    Target Date 02/12/20      PT SHORT TERM GOAL #4   Title Patient ambulates 350' with cane & prosthesis with supervision.    Baseline MET 02/16/2020    Time 4    Period Weeks    Status Achieved    Target Date 02/12/20      PT SHORT TERM GOAL #5   Title Patient negotiates ramps & curbs with cane with  supervision and stairs with 1 rail/cane with supervision with prosthesis.    Baseline MET 02/16/2020    Time 4    Period Weeks    Status Achieved    Target Date 02/12/20             PT Long Term Goals - 12/15/19 1459      PT LONG TERM GOAL #1   Title Patient verbalizes & demonstrates understanding of proper prosthetic care to enable safe utilization of prosthesis.  (All LTGs Target Date: 03/11/2020)    Time 12    Period Weeks    Status On-going    Target Date 03/11/20      PT LONG TERM GOAL #2   Title Patient tolerates prosthesis wear >90% of awake hours without skin issues & limb pain </= 2/10 to enable function throughout his day.    Time 12    Period Weeks    Status On-going    Target Date 03/11/20      PT LONG TERM GOAL #3   Title Berg Balance with prosthesis >/= 45/56 indicating lower fall risk.    Time 12    Period Weeks    Status On-going    Target Date 03/11/20      PT LONG TERM GOAL #4   Title Patient ambulates 500' with cane or less & prosthesis modified independent for community mobility.    Baseline 12/15/2019 not met    Time 12    Period Weeks    Status On-going    Target Date 03/11/20      PT LONG TERM GOAL #5   Title Patient negotiates ramps, curbs & stairs with single rail with cane or less and prosthesis modified independent to enable community access.    Time 12    Period Weeks    Status On-going    Target Date 03/11/20      PT LONG TERM GOAL #6   Title Patient reports back &  residual limb pain increases </= 2 increments on 0-10 scale with standing & gait activities.    Time 12    Period Weeks    Status On-going    Target Date 03/11/20                 Plan - 03/01/20 1350    Clinical Impression Statement Patient was challenged by standing exercises & balance activities.  He is functioning with cane & prosthesis at household level safely and is challenged with endurance issues for community mobility with cane.    Personal Factors and Comorbidities Comorbidity 3+;Fitness;Time since onset of injury/illness/exacerbation;Past/Current Experience    Comorbidities Rt TTA, left 2nd & 3rd toe amp 08/19/2018, ESRD, HTN, CHF, aortic stenosis, left fem-pop bypass, peripheral neuropathy, back spasms    Examination-Activity Limitations Lift;Locomotion Level;Stairs;Stand;Transfers    Examination-Participation Restrictions Community Activity;Shop    Stability/Clinical Decision Making Evolving/Moderate complexity    Rehab Potential Good    PT Frequency 2x / week    PT Duration 12 weeks  PT Treatment/Interventions ADLs/Self Care Home Management;Canalith Repostioning;DME Instruction;Gait training;Stair training;Functional mobility training;Therapeutic activities;Therapeutic exercise;Balance training;Neuromuscular re-education;Patient/family education;Prosthetic Training;Manual techniques;Scar mobilization;Passive range of motion;Dry needling;Joint Manipulations;Vestibular    PT Next Visit Plan check LTGs & discharge    PT Home Exercise Plan .ampsink, see pt instructions section on 09/24/19 note  Access Code: [email or web address]    Consulted and Agree with Plan of Care Patient           Patient will benefit from skilled therapeutic intervention in order to improve the following deficits and impairments:  Abnormal gait,Decreased activity tolerance,Decreased balance,Decreased endurance,Decreased knowledge of use of DME,Decreased mobility,Decreased range of motion,Decreased skin  integrity,Decreased scar mobility,Decreased strength,Dizziness,Increased edema,Postural dysfunction,Prosthetic Dependency,Pain  Visit Diagnosis: Muscle weakness (generalized)  Unsteadiness on feet  Other abnormalities of gait and mobility  Abnormal posture  Stiffness of left knee, not elsewhere classified  Stiffness of right knee, not elsewhere classified  Chronic bilateral low back pain without sciatica     Problem List Patient Active Problem List   Diagnosis Date Noted  . Neuropathy 10/06/2019  . Gait abnormality 08/06/2019  . Back muscle spasm 07/09/2019  . Sleep disturbance   . Nausea without vomiting   . Orthostatic hypotension   . Acute on chronic anemia   . Chronic combined systolic and diastolic congestive heart failure ([place])   . ESRD on dialysis ([place])   . Right below-knee amputee ([place]) 05/18/2019  . Pressure injury of skin 05/14/2019  . Enterococcal bacteremia 05/13/2019  . Polymorphic ventricular tachycardia ([place]) 10/24/2018  . S/P TAVR (transcatheter aortic valve replacement) 10/21/2018  . Anxiety   . Coronary artery disease involving native coronary artery of native heart without angina pectoris   . Thrombocytopenia ([place])   . PAD (peripheral artery disease) ([place]) 07/22/2018  . Acute on chronic combined systolic and diastolic CHF (congestive heart failure) ([place]) 11/30/2017  . ESRD (end stage renal disease) on dialysis ([place])   . Iron deficiency anemia 11/22/2015  . AL amyloidosis ([place]) 09/25/2014  . Anemia of chronic disease 09/25/2014  . Essential hypertension, benign 11/06/2013    Jamey Reas, PT, DPT 03/01/2020, 2:30 PM  [facility] Physical Therapy [address] [place], [place], [address] Phone: [phone]   Fax:  [phone]  Name: Jose Garrett MRN: [id number] Date of Birth: Mar 22, 1950

## 2020-03-10 ENCOUNTER — Ambulatory Visit (INDEPENDENT_AMBULATORY_CARE_PROVIDER_SITE_OTHER): Payer: Medicare Other | Admitting: Physical Therapy

## 2020-03-10 ENCOUNTER — Other Ambulatory Visit: Payer: Self-pay

## 2020-03-10 ENCOUNTER — Encounter: Payer: Self-pay | Admitting: Physical Therapy

## 2020-03-10 DIAGNOSIS — G8929 Other chronic pain: Secondary | ICD-10-CM

## 2020-03-10 DIAGNOSIS — M545 Low back pain, unspecified: Secondary | ICD-10-CM

## 2020-03-10 DIAGNOSIS — M25661 Stiffness of right knee, not elsewhere classified: Secondary | ICD-10-CM

## 2020-03-10 DIAGNOSIS — R2689 Other abnormalities of gait and mobility: Secondary | ICD-10-CM

## 2020-03-10 DIAGNOSIS — R2681 Unsteadiness on feet: Secondary | ICD-10-CM

## 2020-03-10 DIAGNOSIS — R293 Abnormal posture: Secondary | ICD-10-CM

## 2020-03-10 DIAGNOSIS — M25662 Stiffness of left knee, not elsewhere classified: Secondary | ICD-10-CM

## 2020-03-10 DIAGNOSIS — M6281 Muscle weakness (generalized): Secondary | ICD-10-CM

## 2020-03-10 NOTE — Therapy (Signed)
[facility] [address] [place], [place], [address] Phone: [phone]   Fax:  [phone]  Physical Therapy Treatment & Discharge Summary  Patient Details  Name: Jose Garrett MRN: [id number] Date of Birth: 1950/04/24 Referring Provider (PT): Curt Jews, MD   Encounter Date: 03/10/2020  PHYSICAL THERAPY DISCHARGE SUMMARY  Visits from Start of Care: 27  Current functional level related to goals / functional outcomes: See below   Remaining deficits: See below   Education / Equipment: Prosthetic care & HEP.  Patient purchased cane with stand alone tip.   Plan: Patient agrees to discharge.  Patient goals were partially met. Patient is being discharged due to meeting the stated rehab goals.  ?????          PT End of Session - 03/10/20 0932    Visit Number 27    Number of Visits 39    Date for PT Re-Evaluation 03/11/20    Authorization Type [employer] Medicare    Authorization Time Period $30 co-pay    Progress Note Due on Visit 30    PT Start Time 0930    PT Stop Time 1008    PT Time Calculation (min) 38 min    Equipment Utilized During Treatment Gait belt    Activity Tolerance Patient tolerated treatment well    Behavior During Therapy WFL for tasks assessed/performed           Past Medical History:  Diagnosis Date  . Amyloidosis ([place])   . Anemia of chronic disease   . Anxiety   . CAD (coronary artery disease)    a. cardiac cath on 11/25/17 with occlusion of the RCA which could have been his event but the RCA could not be engaged. The RCA filled distally from left to right collaterals. There were no severe stenoses in the left coronary system. Medical therapy recommended, started on plavix.  . Chronic combined systolic and diastolic CHF (congestive heart failure) ([place])    a. EF dropped to 35-40% with grade 2 DD by echo 11/2017.  [name] DJD (degenerative joint disease), lumbar   . ESRD (end stage renal disease) ([place])    ESRD- HEMO  MWF- [employer]  . GERD (gastroesophageal reflux disease)   . Hyperlipidemia   . Hypertension   . Myocardial infarction ([place])   . Neuropathy   . Peripheral vascular disease ([place])   . Pneumonia   . Restless legs   . S/P TAVR (transcatheter aortic valve replacement) 10/21/2018   26 mm Edwards Sapien 3 Ultra transcatheter heart valve placed via percutaneous right transfemoral approach   . Severe aortic stenosis    severe    Past Surgical History:  Procedure Laterality Date  . ABDOMINAL AORTOGRAM W/LOWER EXTREMITY Bilateral 07/16/2018   Procedure: ABDOMINAL AORTOGRAM W/LOWER EXTREMITY;  Surgeon: Wellington Hampshire, MD;  Location: [place] [facility];  Service: Cardiovascular;  Laterality: Bilateral;  . ABDOMINAL AORTOGRAM W/LOWER EXTREMITY N/A 04/09/2019   Procedure: ABDOMINAL AORTOGRAM W/LOWER EXTREMITY - Right Lower;  Surgeon: Waynetta Sandy, MD;  Location: [place] [facility];  Service: Cardiovascular;  Laterality: N/A;  . AMPUTATION Left 08/19/2018   Procedure: AMPUTATION LEFT TOES SECOND AND THIRD;  Surgeon: Waynetta Sandy, MD;  Location: [place];  Service: Vascular;  Laterality: Left;  . AMPUTATION Right 05/07/2019   Procedure: AMPUTATION BELOW KNEE;  Surgeon: Serafina Mitchell, MD;  Location: [facility] OR;  Service: Vascular;  Laterality: Right;  . AV FISTULA PLACEMENT Right 12/27/2015   Procedure: Right  Arm ARTERIOVENOUS (AV) FISTULA CREATION;  Surgeon: Angelia Mould, MD;  Location: [place];  Service: Vascular;  Laterality: Right;  . CATARACT EXTRACTION W/ INTRAOCULAR LENS  IMPLANT, BILATERAL    . COLONOSCOPY    . ERCP W/ SPHICTEROTOMY    . FEMORAL-POPLITEAL BYPASS GRAFT Left 07/22/2018   Procedure: left FEMORAL TO BELOW KNEE POPLITEAL ARTERY BYPASS GREAFT using 22m removable ring propaten gore graft;  Surgeon: CWaynetta Sandy MD;  Location[place] [facility]  Service: Vascular;  Laterality: Left;  . FEMORAL-POPLITEAL BYPASS GRAFT Right 04/10/2019   Procedure: BYPASS  GRAFT RIGHT FEMORAL-POPLITEAL ARTERY;  Surgeon: ERosetta Posner MD;  [place] [facility]  Service: Vascular;  Laterality: Right;  . I & D EXTREMITY Right 05/05/2019   Procedure: Revision of  RIGHT  FOOT Amputation;  Surgeon: CWaynetta Sandy MD;  Location[place] [facility]  Service: Vascular;  Laterality: Right;  . INTRAOPERATIVE TRANSTHORACIC ECHOCARDIOGRAM N/A 10/21/2018   Procedure: Intraoperative Transthoracic Echocardiogram;  Surgeon: CSherren Mocha MD;  [place] [facility]  Service: Open Heart Surgery;  Laterality: N/A;  . IR GENERIC HISTORICAL  01/27/2016   IR FLUORO GUIDE CV LINE RIGHT 01/27/2016 DArne Cleveland MD [facility]INTER[facility]  . IR GENERIC HISTORICAL  01/27/2016   IR [place]ASC ACCESS RIGHT 01/27/2016 DArne Cleveland MD [facility]  . LAPAROSCOPIC CHOLECYSTECTOMY  03/2011  . LEFT HEART CATH AND CORONARY ANGIOGRAPHY N/A 11/25/2017   Procedure: LEFT HEART CATH AND CORONARY ANGIOGRAPHY;  Surgeon: JMartinique Peter M, MD;  [facility];  Service: Cardiovascular;  Laterality: N/A;  . LEFT HEART CATH AND CORONARY ANGIOGRAPHY N/A 07/24/2018   Procedure: LEFT HEART CATH AND CORONARY ANGIOGRAPHY;  Surgeon: CSherren Mocha MD;  [facility];  Service: Cardiovascular;  Laterality: N/A;  . RENAL BIOPSY, PERCUTANEOUS Right 09/08/2014  . RIGHT/LEFT HEART CATH AND CORONARY ANGIOGRAPHY N/A 03/25/2018   Procedure: RIGHT/LEFT HEART CATH AND CORONARY ANGIOGRAPHY;  Surgeon: JMartinique Peter M, MD;  [facility];  Service: Cardiovascular;  Laterality: N/A;  . TEE WITHOUT CARDIOVERSION N/A 05/14/2019   Procedure: TRANSESOPHAGEAL ECHOCARDIOGRAM (TEE);  Surgeon: RFay Records MD;  [facility];  Service: Cardiovascular;  Laterality: N/A;  . TOE AMPUTATION Left 08/19/2018   2nd & 3rd toes  . TONSILLECTOMY  ~ 1960  . TRANSCATHETER AORTIC VALVE REPLACEMENT, TRANSFEMORAL N/A 10/21/2018   Procedure: TRANSCATHETER AORTIC VALVE REPLACEMENT, TRANSFEMORAL;  Surgeon:  CSherren Mocha MD;  Location: [facility]  Service: Open Heart Surgery;  Laterality: N/A;  . TRANSMETATARSAL AMPUTATION Right 04/29/2019   Procedure: RIGHT TRANSMETATARSAL AMPUTATION;  Surgeon: CWaynetta Sandy MD;  [place] [facility]  Service: Vascular;  Laterality: Right;  . ULTRASOUND GUIDANCE FOR VASCULAR ACCESS  03/25/2018   Procedure: Ultrasound Guidance For Vascular Access;  Surgeon: JMartinique Peter M, MD;  [facility];  Service: Cardiovascular;;    There were no vitals filed for this visit.   Subjective Assessment - 03/10/20 0930    Subjective He reports no falls in last month.    Patient is accompained by: Family member   dtr, EAlysia Penna  Pertinent History Rt TTA, left 2nd & 3rd toe amp 08/19/2018, ESRD, HTN, CHF, aortic stenosis, left fem-pop bypass, peripheral neuropathy, back spasms,    Patient Stated Goals use prosthesis to be active in community, working on car, day travels, walks    Currently in Pain? Yes    Pain Score 6     Pain Location Back  Pain Orientation Lower    Pain Descriptors / Indicators Spasm;Sharp    Pain Type Chronic pain    Pain Onset More than a month ago    Pain Frequency Constant    Aggravating Factors  moving certain ways    Pain Relieving Factors injections    Pain Onset More than a month ago              [facility] PT Assessment - 03/10/20 0930      Ambulation/Gait   Ambulation/Gait Yes    Ambulation/Gait Assistance 6: Modified independent (Device/Increase time)    Ambulation Distance (Feet) 350 Feet   350' cane, reports >500' with RW   Assistive device Straight cane;Prosthesis   cane stand alone tip   Gait Pattern Step-through pattern;Poor foot clearance - left   left foot clearance declined >300'   Ambulation Surface Level;Indoor    Ramp 6: Modified independent (Device)   cane stand alone tip & prosthesis   Curb 6: Modified independent (Device/increase time)   cane stand alone tip & prosthesis   Gait Comments back pain  increased from 6/10 to 8/10 with standing / Berg & gait activities.      Berg Balance Test   Sit to Stand Able to stand without using hands and stabilize independently    Standing Unsupported Able to stand safely 2 minutes    Sitting with Back Unsupported but Feet Supported on Floor or Stool Able to sit safely and securely 2 minutes    Stand to Sit Sits safely with minimal use of hands    Transfers Able to transfer safely, minor use of hands    Standing Unsupported with Eyes Closed Able to stand 10 seconds safely    Standing Unsupported with Feet Together Able to place feet together independently and stand 1 minute safely    From Standing, Reach Forward with Outstretched Arm Can reach confidently >25 cm (10")    From Standing Position, Pick up Object from Floor Able to pick up shoe safely and easily    From Standing Position, Turn to Look Behind Over each Shoulder Looks behind from both sides and weight shifts well    Turn 360 Degrees Able to turn 360 degrees safely but slowly    Standing Unsupported, Alternately Place Feet on Step/Stool Able to complete >2 steps/needs minimal assist    Standing Unsupported, One Foot in Front Able to take small step independently and hold 30 seconds    Standing on One Leg Tries to lift leg/unable to hold 3 seconds but remains standing independently    Total Score 46    Berg comment: 12/7 was 40/56, 10/28 was 36/56  & 7/13 was 12/56           Prosthetics Assessment - 03/10/20 0930      Prosthetics   Prosthetic Care Independent with Skin check;Residual limb care;Prosthetic cleaning;Care of non-amputated limb;Ply sock cleaning;Correct ply sock adjustment;Proper wear schedule/adjustment;Proper weight-bearing schedule/adjustment    Donning prosthesis  Modified independent (Device/Increase time)    Doffing prosthesis  Modified independent (Device/Increase time)    Current prosthetic wear tolerance (days/week)  daily    Current prosthetic wear tolerance  (#hours/day)  reports most of awake hours. Sleeps after dialysis so decreased time but % is most of awake hours.     Edema none in residual limb    Prosthesis Description silicon liner with shuttle pin lock  [facility] PT Treatment/Exercise - 03/10/20 0930      Knee/Hip Exercises: Aerobic   Nustep Level 7 for 10 min with BLEs & BUEs                  PT Education - 03/10/20 1008    Education Details PT discussed benefits to ongoing exercise / fitness with recommendation for  low intensity 2-3 days / wk (nondialysis days).    Person(s) Educated Patient    Methods Explanation;Verbal cues    Comprehension Verbalized understanding            PT Short Term Goals - 02/16/20 1426      PT SHORT TERM GOAL #1   Title Patient verbalizes understanding of adjusting ply socks with limb volume changes including with dialysis.  (All STGs Target Date: 02/12/2020)    Baseline MET 02/16/2020    Time 4    Period Weeks    Status Achieved    Target Date 02/12/20      PT SHORT TERM GOAL #2   Title Patient reports back pain increases </= 3 increments on 0-10 scale.    Baseline MET 02/16/2020    Time 4    Period Weeks    Status Achieved    Target Date 02/12/20      PT SHORT TERM GOAL #3   Title Berg Balance >40 / 56    Baseline MET 02/16/2020    Time 4    Period Weeks    Status Achieved    Target Date 02/12/20      PT SHORT TERM GOAL #4   Title Patient ambulates 350' with cane & prosthesis with supervision.    Baseline MET 02/16/2020    Time 4    Period Weeks    Status Achieved    Target Date 02/12/20      PT SHORT TERM GOAL #5   Title Patient negotiates ramps & curbs with cane with  supervision and stairs with 1 rail/cane with supervision with prosthesis.    Baseline MET 02/16/2020    Time 4    Period Weeks    Status Achieved    Target Date 02/12/20             PT Long Term Goals - 03/10/20 1009      PT LONG TERM GOAL #1   Title  Patient verbalizes & demonstrates understanding of proper prosthetic care to enable safe utilization of prosthesis.  (All LTGs Target Date: 03/11/2020)    Baseline MET 03/10/2020    Time 12    Period Weeks    Status Achieved      PT LONG TERM GOAL #2   Title Patient tolerates prosthesis wear >90% of awake hours without skin issues & limb pain </= 2/10 to enable function throughout his day.    Baseline MET 03/10/2020    Time 12    Period Weeks    Status Achieved      PT LONG TERM GOAL #3   Title Berg Balance with prosthesis >/= 45/56 indicating lower fall risk.    Baseline MET 03/10/2020  Merrilee Jansky 46/56    Time 12    Period Weeks    Status Achieved      PT LONG TERM GOAL #4   Title Patient ambulates 500' with cane or less & prosthesis modified independent for community mobility.    Baseline Partially MET 03/10/2020 350' with cane & reports >500' with RW    Time 12  Period Weeks    Status Partially Met      PT LONG TERM GOAL #5   Title Patient negotiates ramps, curbs & stairs with single rail with cane or less and prosthesis modified independent to enable community access.    Baseline MET 03/10/2020    Time 12    Period Weeks    Status Achieved      PT LONG TERM GOAL #6   Title Patient reports back & residual limb pain increases </= 2 increments on 0-10 scale with standing & gait activities.    Baseline MET 03/10/2020    Time 12    Period Weeks    Status Achieved                 Plan - 03/10/20 0932    Clinical Impression Statement Patient met or partially met all LTGs. He is able to function at community level with cane & prosthesis. He still chooses to use a walker for most community activities as he feels more comfortable.  He is wearing & using prosthesis most of awake hours including at dialysis without any issues.    Personal Factors and Comorbidities Comorbidity 3+;Fitness;Time since onset of injury/illness/exacerbation;Past/Current Experience    Comorbidities  Rt TTA, left 2nd & 3rd toe amp 08/19/2018, ESRD, HTN, CHF, aortic stenosis, left fem-pop bypass, peripheral neuropathy, back spasms    Examination-Activity Limitations Lift;Locomotion Level;Stairs;Stand;Transfers    Examination-Participation Restrictions Community Activity;Shop    Stability/Clinical Decision Making Evolving/Moderate complexity    Rehab Potential Good    PT Frequency 2x / week    PT Duration 12 weeks    PT Treatment/Interventions ADLs/Self Care Home Management;Canalith Repostioning;DME Instruction;Gait training;Stair training;Functional mobility training;Therapeutic activities;Therapeutic exercise;Balance training;Neuromuscular re-education;Patient/family education;Prosthetic Training;Manual techniques;Scar mobilization;Passive range of motion;Dry needling;Joint Manipulations;Vestibular    PT Next Visit Plan discharge    PT Home Exercise Plan .ampsink, see pt instructions section on 09/24/19 note  Access Code: [email or web address]    Consulted and Agree with Plan of Care Patient           Patient will benefit from skilled therapeutic intervention in order to improve the following deficits and impairments:  Abnormal gait,Decreased activity tolerance,Decreased balance,Decreased endurance,Decreased knowledge of use of DME,Decreased mobility,Decreased range of motion,Decreased skin integrity,Decreased scar mobility,Decreased strength,Dizziness,Increased edema,Postural dysfunction,Prosthetic Dependency,Pain  Visit Diagnosis: Muscle weakness (generalized)  Unsteadiness on feet  Other abnormalities of gait and mobility  Abnormal posture  Stiffness of left knee, not elsewhere classified  Stiffness of right knee, not elsewhere classified  Chronic bilateral low back pain without sciatica     Problem List Patient Active Problem List   Diagnosis Date Noted  . Neuropathy 10/06/2019  . Gait abnormality 08/06/2019  . Back muscle spasm 07/09/2019  . Sleep disturbance   . Nausea without  vomiting   . Orthostatic hypotension   . Acute on chronic anemia   . Chronic combined systolic and diastolic congestive heart failure ([place])   . ESRD on dialysis ([place])   . Right below-knee amputee ([place]) 05/18/2019  . Pressure injury of skin 05/14/2019  . Enterococcal bacteremia 05/13/2019  . Polymorphic ventricular tachycardia ([place]) 10/24/2018  . S/P TAVR (transcatheter aortic valve replacement) 10/21/2018  . Anxiety   . Coronary artery disease involving native coronary artery of native heart without angina pectoris   . Thrombocytopenia ([place])   . PAD (peripheral artery disease) ([place]) 07/22/2018  . Acute on chronic combined systolic and diastolic CHF (congestive heart failure) ([place]) 11/30/2017  . ESRD (end stage renal disease)  on dialysis [facility])   . Iron deficiency anemia 11/22/2015  . AL amyloidosis ([place]) 09/25/2014  . Anemia of chronic disease 09/25/2014  . Essential hypertension, benign 11/06/2013    Jamey Reas, PT, DPT 03/10/2020, 10:13 AM  [facility] [address] [place], [place], [address] Phone: [phone]   Fax:  [phone]  Name: Jose Garrett MRN: [id number] Date of Birth: 08-22-1950

## 2020-04-11 ENCOUNTER — Other Ambulatory Visit: Payer: Self-pay | Admitting: *Deleted

## 2020-04-11 DIAGNOSIS — I779 Disorder of arteries and arterioles, unspecified: Secondary | ICD-10-CM

## 2020-04-11 DIAGNOSIS — I739 Peripheral vascular disease, unspecified: Secondary | ICD-10-CM

## 2020-04-11 DIAGNOSIS — Z95828 Presence of other vascular implants and grafts: Secondary | ICD-10-CM

## 2020-04-28 ENCOUNTER — Encounter: Payer: Self-pay | Admitting: Cardiovascular Disease

## 2020-04-28 ENCOUNTER — Ambulatory Visit: Payer: Medicare Other | Admitting: Cardiovascular Disease

## 2020-04-28 ENCOUNTER — Other Ambulatory Visit: Payer: Self-pay

## 2020-04-28 ENCOUNTER — Other Ambulatory Visit: Payer: Self-pay | Admitting: Cardiovascular Disease

## 2020-04-28 ENCOUNTER — Telehealth: Payer: Self-pay | Admitting: Cardiovascular Disease

## 2020-04-28 VITALS — BP 146/72 | HR 74 | Ht 70.0 in | Wt 182.8 lb

## 2020-04-28 DIAGNOSIS — I472 Ventricular tachycardia: Secondary | ICD-10-CM

## 2020-04-28 DIAGNOSIS — Z952 Presence of prosthetic heart valve: Secondary | ICD-10-CM

## 2020-04-28 DIAGNOSIS — E7800 Pure hypercholesterolemia, unspecified: Secondary | ICD-10-CM

## 2020-04-28 DIAGNOSIS — I255 Ischemic cardiomyopathy: Secondary | ICD-10-CM

## 2020-04-28 DIAGNOSIS — I2510 Atherosclerotic heart disease of native coronary artery without angina pectoris: Secondary | ICD-10-CM | POA: Diagnosis not present

## 2020-04-28 DIAGNOSIS — I4729 Other ventricular tachycardia: Secondary | ICD-10-CM

## 2020-04-28 MED ORDER — ATORVASTATIN CALCIUM 80 MG PO TABS: 80.0000 mg | ORAL_TABLET | Freq: Every day | ORAL | 3 refills | Status: DC

## 2020-04-28 MED ORDER — CLOPIDOGREL BISULFATE 75 MG PO TABS: 75.0000 mg | ORAL_TABLET | Freq: Every day | ORAL | 3 refills | Status: DC

## 2020-04-28 NOTE — Patient Instructions (Signed)
Medication Instructions:  No changes *If you need a refill on your cardiac medications before your next appointment, please call your pharmacy*   Lab Work: Please return for fasting lipids/liver function  If you have labs (blood work) drawn today and your tests are completely normal, you will receive your results only by: [name] MyChart Message (if you have MyChart) OR . A paper copy in the mail If you have any lab test that is abnormal or we need to change your treatment, we will call you to review the results.   Testing/Procedures: none   Follow-Up: At [facility], you and your health needs are our priority.  As part of our continuing mission to provide you with exceptional heart care, we have created designated Provider Care Teams.  These Care Teams include your primary Cardiologist (physician) and Advanced Practice Providers (APPs -  Physician Assistants and Nurse Practitioners) who all work together to provide you with the care you need, when you need it.  Your next appointment:   12 month(s)  The format for your next appointment:   In Person  Provider:   You may see Lauree Chandler, MD or one of the following Advanced Practice Providers on your designated Care Team:    Melina Copa, PA-C  Ermalinda Barrios, PA-C

## 2020-04-28 NOTE — Telephone Encounter (Signed)
Patient cancelled other appt with Dr. Angelena Form because of back appt. Back doctor canncelled appt so patient wanted old appt back. Was able to rescheudle and get him the 4:20 pm slot.

## 2020-04-28 NOTE — Progress Notes (Signed)
Chief Complaint  Patient presents with  . Follow-up    CAD    History of Present Illness: 70 yo male with history of ESRD on HD, AL amyloid (kidney involvement), aortic stenosis s/p TAVR, HTN, HLD, GERD, anemia, chronic combined CHF, PAD and CAD who is here today for cardiac follow up. He was admitted to [facility] September 2019 with acute respiratory failure in setting of hypertensive urgency. His troponin was elevated and his EKG showed diffuse ST depression. Cardiac cath 11/25/17 with occlusion of the RCA which was felt to be the culprit but this vessel could not be engaged. The distal RCA filled from collaterals. There was moderate disease in the ostial Circumflex and mild disease in the LAD. Medical therapy was recommended. LVEF=35-40%. There was moderate aortic stenosis. Echo on 12/19/17 with LVEF=65-70%. Moderate AS (mean gradient 31 mmHg). His amyloid is followed by oncology. He was admitted to [facility] January 2020 with dyspnea. Cardiac cath 03/25/18 with stable CAD. Moderate AS by cath. In May 2020 the patient was hospitalized with critical limb ischemia involving his left lower extremity with 2 gangrenous toes. He was found to have bilateral chronic occlusion of the superficial femoral arteries as well as the left popliteal artery. He underwent left common femoral to below-knee popliteal artery bypass graft using synthetic Gore-Tex graft. Postoperatively,he suffered an acute exacerbation of chronic combined systolic and diastolic congestive heart failure with mildly positive troponins consistent with NSTEMI. Catheterization revealed no significant change in his coronary artery disease with chronic occlusion of the right coronary artery and no significant disease in the left coronary circulation. Echocardiogram revealed further drop in left ventricular systolic function with ejection fraction estimated 35 to 40% with severe aortic stenosis. His subsequent recovery was slow and he later underwent  amputation of his gangrenous toes. He had a prolonged recovery requiring inpatient rehab. He developed a large seroma in the left groin associated with his surgical incision which has been treated conservatively. More recently he has been having problems tolerating dialysis with tendency to get hypotension towards the completion of dialysis treatments.He underwent successful TAVR with a 26 mm Edwards Sapien Ultra THV via the R TF approach on 10/21/18. Post operative echo showed persistent moderate to severe LV systolic dysfunction (EF 95-62%) with normally functioning TAVR with no AI and mean gradient 16 mmHg. He was started on aspirin and plavix, but aspirin later discontinued given worsening thrombocytopenia. Frequent runs of polymorphic VT was noted on tele. He was started on amiodarone and LifeVest was placed. Echo September 2020 with LVEF=35-40%. The Lifevest was stopped. He was readmitted February 2021 with critical limb ischemia or RLE and underwent right fem pop bypass and later right transmetatarsal amputation followed by BKA later in the year. LVEF=45-50% by echo in August 2021.   He is here today for follow up. The patient denies any chest pain, dyspnea, palpitations, lower extremity edema, orthopnea, PND, dizziness, near syncope or syncope.    Primary Care Physician: [name], MD (Inactive)  Past Medical History:  Diagnosis Date  . Amyloidosis ([place])   . Anemia of chronic disease   . Anxiety   . CAD (coronary artery disease)    a. cardiac cath on 11/25/17 with occlusion of the RCA which could have been his event but the RCA could not be engaged. The RCA filled distally from left to right collaterals. There were no severe stenoses in the left coronary system. Medical therapy recommended, started on plavix.  . Chronic combined systolic and diastolic CHF (  congestive heart failure) ([place])    a. EF dropped to 35-40% with grade 2 DD by echo 11/2017.  [name] DJD (degenerative joint disease), lumbar    . ESRD (end stage renal disease) ([place])    ESRD- HEMO MWF- [facility]  . GERD (gastroesophageal reflux disease)   . Hyperlipidemia   . Hypertension   . Myocardial infarction ([place])   . Neuropathy   . Peripheral vascular disease ([place])   . Pneumonia   . Restless legs   . S/P TAVR (transcatheter aortic valve replacement) 10/21/2018   26 mm Edwards Sapien 3 Ultra transcatheter heart valve placed via percutaneous right transfemoral approach   . Severe aortic stenosis    severe    Past Surgical History:  Procedure Laterality Date  . ABDOMINAL AORTOGRAM W/LOWER EXTREMITY Bilateral 07/16/2018   Procedure: ABDOMINAL AORTOGRAM W/LOWER EXTREMITY;  Surgeon: Wellington Hampshire, MD;  Location: [place] [facility];  Service: Cardiovascular;  Laterality: Bilateral;  . ABDOMINAL AORTOGRAM W/LOWER EXTREMITY N/A 04/09/2019   Procedure: ABDOMINAL AORTOGRAM W/LOWER EXTREMITY - Right Lower;  Surgeon: Waynetta Sandy, MD;  Location: [place] [facility];  Service: Cardiovascular;  Laterality: N/A;  . AMPUTATION Left 08/19/2018   Procedure: AMPUTATION LEFT TOES SECOND AND THIRD;  Surgeon: Waynetta Sandy, MD;  Location: [place];  Service: Vascular;  Laterality: Left;  . AMPUTATION Right 05/07/2019   Procedure: AMPUTATION BELOW KNEE;  Surgeon: Serafina Mitchell, MD;  Location: [facility] OR;  Service: Vascular;  Laterality: Right;  . AV FISTULA PLACEMENT Right 12/27/2015   Procedure: Right Arm ARTERIOVENOUS (AV) FISTULA CREATION;  Surgeon: Angelia Mould, MD;  Location: [place];  Service: Vascular;  Laterality: Right;  . CATARACT EXTRACTION W/ INTRAOCULAR LENS  IMPLANT, BILATERAL    . COLONOSCOPY    . ERCP W/ SPHICTEROTOMY    . FEMORAL-POPLITEAL BYPASS GRAFT Left 07/22/2018   Procedure: left FEMORAL TO BELOW KNEE POPLITEAL ARTERY BYPASS GREAFT using 6mm removable ring propaten gore graft;  Surgeon: Waynetta Sandy, MD;  Location: [place];  Service: Vascular;  Laterality: Left;  .  FEMORAL-POPLITEAL BYPASS GRAFT Right 04/10/2019   Procedure: BYPASS GRAFT RIGHT FEMORAL-POPLITEAL ARTERY;  Surgeon: Rosetta Posner, MD;  Location: [place];  Service: Vascular;  Laterality: Right;  . I & D EXTREMITY Right 05/05/2019   Procedure: Revision of  RIGHT  FOOT Amputation;  Surgeon: Waynetta Sandy, MD;  Location: [place];  Service: Vascular;  Laterality: Right;  . INTRAOPERATIVE TRANSTHORACIC ECHOCARDIOGRAM N/A 10/21/2018   Procedure: Intraoperative Transthoracic Echocardiogram;  Surgeon: Sherren Mocha, MD;  Location: [place];  Service: Open Heart Surgery;  Laterality: N/A;  . IR GENERIC HISTORICAL  01/27/2016   IR FLUORO GUIDE CV LINE RIGHT 01/27/2016 Arne Cleveland, MD [facility]-INTER[facility]  . IR GENERIC HISTORICAL  01/27/2016   IR US GUIDE VASC ACCESS RIGHT 01/27/2016 Arne Cleveland, MD [facility]  . LAPAROSCOPIC CHOLECYSTECTOMY  03/2011  . LEFT HEART CATH AND CORONARY ANGIOGRAPHY N/A 11/25/2017   Procedure: LEFT HEART CATH AND CORONARY ANGIOGRAPHY;  Surgeon: Martinique, Peter M, MD;  Location: [place] [facility];  Service: Cardiovascular;  Laterality: N/A;  . LEFT HEART CATH AND CORONARY ANGIOGRAPHY N/A 07/24/2018   Procedure: LEFT HEART CATH AND CORONARY ANGIOGRAPHY;  Surgeon: Sherren Mocha, MD;  Location: [place] [facility];  Service: Cardiovascular;  Laterality: N/A;  . RENAL BIOPSY, PERCUTANEOUS Right 09/08/2014  . RIGHT/LEFT HEART CATH AND CORONARY ANGIOGRAPHY N/A 03/25/2018   Procedure: RIGHT/LEFT HEART CATH AND CORONARY ANGIOGRAPHY;  Surgeon:  Martinique, Peter M, MD;  Location: [place] [facility];  Service: Cardiovascular;  Laterality: N/A;  . TEE WITHOUT CARDIOVERSION N/A 05/14/2019   Procedure: TRANSESOPHAGEAL ECHOCARDIOGRAM (TEE);  Surgeon: Fay Records, MD;  Location: [facility] ENDOSCOPY;  Service: Cardiovascular;  Laterality: N/A;  . TOE AMPUTATION Left 08/19/2018   2nd & 3rd toes  . TONSILLECTOMY  ~ 1960  . TRANSCATHETER AORTIC VALVE REPLACEMENT, TRANSFEMORAL N/A 10/21/2018   Procedure:  TRANSCATHETER AORTIC VALVE REPLACEMENT, TRANSFEMORAL;  Surgeon: Sherren Mocha, MD;  Location: [place];  Service: Open Heart Surgery;  Laterality: N/A;  . TRANSMETATARSAL AMPUTATION Right 04/29/2019   Procedure: RIGHT TRANSMETATARSAL AMPUTATION;  Surgeon: Waynetta Sandy, MD;  Location: [place];  Service: Vascular;  Laterality: Right;  . ULTRASOUND GUIDANCE FOR VASCULAR ACCESS  03/25/2018   Procedure: Ultrasound Guidance For Vascular Access;  Surgeon: Martinique, Peter M, MD;  Location: [place] [facility];  Service: Cardiovascular;;    Current Outpatient Medications  Medication Sig Dispense Refill  . acetaminophen (TYLENOL) 325 MG tablet Take 1-2 tablets (325-650 mg total) by mouth every 4 (four) hours as needed for mild pain (or temp >/= 101 F).    [name] amiodarone (PACERONE) 200 MG tablet Take 1 tablet (200 mg total) by mouth daily. 90 tablet 2  . Darbepoetin Alfa (ARANESP) 100 MCG/0.5ML SOSY injection Inject 0.5 mLs (100 mcg total) into the vein every Friday with hemodialysis. 4.2 mL   . docusate sodium (COLACE) 100 MG capsule Take 1 capsule (100 mg total) by mouth 2 (two) times daily. 10 capsule 0  . LORazepam (ATIVAN) 1 MG tablet Take 1 tablet (1 mg total) by mouth 2 (two) times daily as needed for anxiety. 20 tablet 0  . Melatonin 3 MG TABS Take 0.5 tablets (1.5 mg total) by mouth at bedtime. 30 tablet 0  . multivitamin (RENA-VIT) TABS tablet Take 1 tablet by mouth at bedtime. 30 tablet 0  . oxyCODONE (OXY IR/ROXICODONE) 5 MG immediate release tablet Take 1-2 tablets (5-10 mg total) by mouth every 4 (four) hours as needed for moderate pain. 30 tablet 0  . pantoprazole (PROTONIX) 40 MG tablet Take 1 tablet (40 mg total) by mouth daily. 30 tablet 0  . Vancomycin (VANCOCIN) 750-5 MG/150ML-% SOLN Inject 150 mLs (750 mg total) into the vein every Monday, Wednesday, and Friday with hemodialysis. 4000 mL 0  . vitamin B-12 (CYANOCOBALAMIN) 1000 MCG tablet Take 1 tablet (1,000 mcg total) by mouth every  evening. 30 tablet 0  . atorvastatin (LIPITOR) 80 MG tablet Take 1 tablet (80 mg total) by mouth daily with supper. 90 tablet 3  . clopidogrel (PLAVIX) 75 MG tablet Take 1 tablet (75 mg total) by mouth daily with supper. 90 tablet 3   No current facility-administered medications for this visit.    No Known Allergies  Social History   Socioeconomic History  . Marital status: Divorced    Spouse name: Not on file  . Number of children: Not on file  . Years of education: Not on file  . Highest education level: Not on file  Occupational History  . Not on file  Tobacco Use  . Smoking status: Former Smoker    Packs/day: 2.00    Years: 40.00    Pack years: 80.00    Types: Cigarettes    Quit date: 03/19/2011    Years since quitting: 9.1  . Smokeless tobacco: Never Used  Vaping Use  . Vaping Use: Never used  Substance and Sexual Activity  . Alcohol  use: No    Alcohol/week: 0.0 standard drinks  . Drug use: No    Comment:  "I have used marijuana till the 1990's". Notes that he quit 15 yrs ago  . Sexual activity: Not Currently  Other Topics Concern  . 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    Social Determinants of Health   Financial Resource Strain: Not on file  Food Insecurity: Not on file  Transportation Needs: Not on file  Physical Activity: Not on file  Stress: Not on file  Social Connections: Not on file  Intimate Partner Violence: Not on file    Family History  Problem Relation Age of Onset  . Hypertension Mother   . Diabetes Mother   . Heart disease Mother   . Skin cancer Mother   . Hypertension Father   . Diabetes Father   . Diabetes Sister     Review of Systems:  As stated in the HPI and otherwise negative.   BP (!) 146/72   Pulse 74   Ht 5\' 10"  (1.778 m)   Wt 182 lb 12.8 oz (82.9 kg)   SpO2 99%   BMI 26.23  kg/m   Physical Examination:  General: Well developed, well nourished, NAD  HEENT: OP clear, mucus membranes moist  SKIN: warm, dry. No rashes. Neuro: No focal deficits  Musculoskeletal: Muscle strength 5/5 all ext  Psychiatric: Mood and affect normal  Neck: No JVD, no carotid bruits, no thyromegaly, no lymphadenopathy.  Lungs:Clear bilaterally, no wheezes, rhonci, crackles Cardiovascular: Regular rate and rhythm. No murmurs, gallops or rubs. Abdomen:Soft. Bowel sounds present. Non-tender.  Extremities: No lower extremity edema. Pulses are 2 + in the bilateral DP/PT.  Echo August 2021:  1. Left ventricular ejection fraction, by estimation, is 45 to 50%. The  left ventricle has mildly decreased function. The left ventricle  demonstrates global hypokinesis. There is moderate concentric left  ventricular hypertrophy. Left ventricular  diastolic parameters are consistent with Grade I diastolic dysfunction  (impaired relaxation). There is hypokinesis of the left ventricular, basal  inferior wall and inferolateral wall.  2. Right ventricular systolic function is mildly reduced. The right  ventricular size is normal. Tricuspid regurgitation signal is inadequate  for assessing PA pressure.  3. Left atrial size was mild to moderately dilated.  4. The mitral valve is abnormal. Trivial mitral valve regurgitation. No  evidence of mitral stenosis.  5. The aortic valve has been repaired/replaced. Aortic valve  regurgitation is not visualized. There is a 26 mm Edwards Sapien  prosthetic (TAVR) valve present in the aortic position. Procedure Date:  10/21/2018. Echo findings are consistent with normal  structure and function of the aortic valve prosthesis. Aortic valve mean  gradient measures 9.0 mmHg. Aortic valve Vmax measures 1.95 m/s.   EKG:  EKG is ordered today. The ekg ordered today demonstrates sinus, RBBB, Non-specific ST and T wave abn-unchanged  Recent Labs: 05/27/2019: BUN  27; Creatinine, Ser 7.39; Hemoglobin 8.2; Platelets 97; Potassium 4.1; Sodium 135   Lipid Panel    Component Value Date/Time   CHOL 117 02/12/2018 0823   TRIG 174 (H) 02/12/2018 0823   HDL 32 (L) 02/12/2018 0823   CHOLHDL 3.7 02/12/2018 0823   CHOLHDL 7.4 02/25/2014 0959   VLDL 40 02/25/2014 0959   LDLCALC 50 02/12/2018 0973  Wt Readings from Last 3 Encounters:  04/28/20 182 lb 12.8 oz (82.9 kg)  10/22/19 157 lb (71.2 kg)  10/06/19 165 lb (74.8 kg)     Other studies Reviewed: Additional studies/ records that were reviewed today include:  Review of the above records demonstrates:    Assessment and Plan:   1. CAD without angina: Cardiac cath January 2020 with stable CAD, known chronic occlusion of the RCA. He has no chest pain. Will continue Plavix, statin and Coreg.     2. Ischemic Cardiomyopathy/Chronic combined CHF: LVEF=45-50% by echo August 2021. Volume control by dialysis. Will continue his Coreg. No Ace-inh or ARB due to hypotension on dialysis.     3. Aortic stenosis: He is s/p TAVR in August 2020. Will continue Plavix and SBE prophylaxis as needed.   4. HTN: BP is well controlled. Managed by Nephrology  5. Carotid artery disease: Mild bilateral carotid artery disease by dopplers October 2019.   6. ESRD: on HD.   7. Hyperlipidemia: LDL at goal in 2019. Check lipids and LFTs now.  Continue statin  8. NSVT: No recent palpitations. Continue amiodarone.   9. Pre-operative cardiovascular examination: He is planning a back surgery. He has no concerning signs for angina, CHF or arrhythmias. He can proceed with his planned back surgery without further cardiac workup.   Current medicines are reviewed at length with the patient today.  The patient does not have concerns regarding medicines.  The following changes have been made:  no change  Labs/ tests ordered today include:   Orders Placed This Encounter  Procedures  . Lipid panel  . Hepatic function panel  .  EKG 12-Lead     Disposition:   F/U with me in one year    Signed, Lauree Chandler, MD 04/28/2020 4:58 PM    [address], [place], [place]  [address] Phone: [phone]; Fax: [phone]

## 2020-05-03 ENCOUNTER — Ambulatory Visit (HOSPITAL_COMMUNITY)
Admission: RE | Admit: 2020-05-03 | Discharge: 2020-05-03 | Disposition: A | Discharge status: Home / Self Care | Payer: Medicare Other | Source: Ambulatory Visit | Attending: Vascular Surgery | Admitting: Vascular Surgery

## 2020-05-03 ENCOUNTER — Other Ambulatory Visit: Payer: Self-pay

## 2020-05-03 ENCOUNTER — Ambulatory Visit (INDEPENDENT_AMBULATORY_CARE_PROVIDER_SITE_OTHER)
Admission: RE | Admit: 2020-05-03 | Discharge: 2020-05-03 | Disposition: A | Discharge status: Home / Self Care | Payer: Medicare Other | Source: Ambulatory Visit | Attending: Vascular Surgery | Admitting: Vascular Surgery

## 2020-05-03 ENCOUNTER — Ambulatory Visit: Payer: Medicare Other | Admitting: Physician Assistant

## 2020-05-03 ENCOUNTER — Other Ambulatory Visit: Payer: Medicare Other | Admitting: *Deleted

## 2020-05-03 VITALS — BP 156/65 | HR 77 | Temp 99.7°F | Resp 18 | Ht 70.0 in | Wt 183.0 lb

## 2020-05-03 DIAGNOSIS — Z95828 Presence of other vascular implants and grafts: Secondary | ICD-10-CM | POA: Insufficient documentation

## 2020-05-03 DIAGNOSIS — R0989 Other specified symptoms and signs involving the circulatory and respiratory systems: Secondary | ICD-10-CM

## 2020-05-03 DIAGNOSIS — I779 Disorder of arteries and arterioles, unspecified: Secondary | ICD-10-CM

## 2020-05-03 DIAGNOSIS — I739 Peripheral vascular disease, unspecified: Secondary | ICD-10-CM | POA: Insufficient documentation

## 2020-05-03 DIAGNOSIS — Z952 Presence of prosthetic heart valve: Secondary | ICD-10-CM

## 2020-05-03 DIAGNOSIS — E7800 Pure hypercholesterolemia, unspecified: Secondary | ICD-10-CM

## 2020-05-03 DIAGNOSIS — I4729 Other ventricular tachycardia: Secondary | ICD-10-CM

## 2020-05-03 DIAGNOSIS — I255 Ischemic cardiomyopathy: Secondary | ICD-10-CM

## 2020-05-03 DIAGNOSIS — I472 Ventricular tachycardia: Secondary | ICD-10-CM

## 2020-05-03 DIAGNOSIS — I2510 Atherosclerotic heart disease of native coronary artery without angina pectoris: Secondary | ICD-10-CM

## 2020-05-03 LAB — LIPID PANEL
Chol/HDL Ratio: 4 ratio (ref 0.0–5.0)
Cholesterol, Total: 129 mg/dL (ref 100–199)
HDL: 32 mg/dL — ABNORMAL LOW (ref >39)
LDL Chol Calc (NIH): 68 mg/dL (ref 0–99)
Triglycerides: 172 mg/dL — ABNORMAL HIGH (ref 0–149)
VLDL Cholesterol Cal: 29 mg/dL (ref 5–40)

## 2020-05-03 LAB — HEPATIC FUNCTION PANEL
ALT: 13 IU/L (ref 0–44)
AST: 15 IU/L (ref 0–40)
Albumin: 4.6 g/dL (ref 3.8–4.8)
Alkaline Phosphatase: 62 IU/L (ref 44–121)
Bilirubin Total: 0.4 mg/dL (ref 0.0–1.2)
Bilirubin, Direct: 0.16 mg/dL (ref 0.00–0.40)
Total Protein: 6.7 g/dL (ref 6.0–8.5)

## 2020-05-03 NOTE — Progress Notes (Signed)
HISTORY AND PHYSICAL     CC:  follow up. Requesting Provider:  No ref. provider found  HPI: This is a 70 y.o. male who is here today for follow up for PAD.  He is s/p left common femoral to below knee popliteal artery bypass graft with PTFE and third toe amputation on 07/22/18 by Dr. Donzetta Matters. He additionally underwent a right fem to popliteal artery bypass on 04/10/19 and subsequently a right below knee amputation on 05/07/19 due to a poorly healing right transmetatarsal amputation.  Pt was last seen May 2021 and at that time, he had a small scab on the BKA incision with tenderness.  There was no drainage and otherwise was doing well.  He was not having any claudication, rest pain or non healing wounds.  His left 3rd toe amp site had healed.   He was seen by Dr. Angelena Form last week.  His CAD was stable.  Pt is s/p TAVR in August 2020.  HTN well controlled.  He has back surgery planned and can proceed per cardiology without further cardiac workup.  He states he may be having back surgery that involves rods and screws.  Says dialysis makes his back pain worse.   He has a right RC AVF that was created by Dr. Scot Dock in 2017.  He states it is working well.  He states he had to have it "roto rootered" a few months back at [facility].   The pt returns today for follow up.  He states that he is doing well.  He is using a prosthesis for the right leg and that is going well.  His right BKA has healed nicely.   He does not have any pain in the left foot.  He does have some numbness between the ankle and the below knee incision.  His left toe amp site has healed nicely.  He states he has some swelling in the left leg but he doesn't really elevate his leg.   He denies any amaurosis fugax, speech difficulties, unilateral sudden weakness, numbness or paralysis or facial droop. His last carotid duplex was in 2019.   The pt is on a statin for cholesterol management.    The pt is not on an aspirin.    Other AC:   Plavix The pt is not on medication for hypertension.  The pt does not have diabetes. Tobacco hx:  Former-quit 2013  Pt does not have family hx of AAA.  Past Medical History:  Diagnosis Date  . Amyloidosis ([place])   . Anemia of chronic disease   . Anxiety   . CAD (coronary artery disease)    a. cardiac cath on 11/25/17 with occlusion of the RCA which could have been his event but the RCA could not be engaged. The RCA filled distally from left to right collaterals. There were no severe stenoses in the left coronary system. Medical therapy recommended, started on plavix.  . Chronic combined systolic and diastolic CHF (congestive heart failure) ([place])    a. EF dropped to 35-40% with grade 2 DD by echo 11/2017.  [name] DJD (degenerative joint disease), lumbar   . ESRD (end stage renal disease) ([place])    ESRD- HEMO MWF- [facility]  . GERD (gastroesophageal reflux disease)   . Hyperlipidemia   . Hypertension   . Myocardial infarction ([place])   . Neuropathy   . Peripheral vascular disease ([place])   . Pneumonia   . Restless legs   . S/P TAVR (transcatheter aortic  valve replacement) 10/21/2018   26 mm Edwards Sapien 3 Ultra transcatheter heart valve placed via percutaneous right transfemoral approach   . Severe aortic stenosis    severe    Past Surgical History:  Procedure Laterality Date  . ABDOMINAL AORTOGRAM W/LOWER EXTREMITY Bilateral 07/16/2018   Procedure: ABDOMINAL AORTOGRAM W/LOWER EXTREMITY;  Surgeon: Wellington Hampshire, MD;  Location: [facility] [facility];  Service: Cardiovascular;  Laterality: Bilateral;  . ABDOMINAL AORTOGRAM W/LOWER EXTREMITY N/A 04/09/2019   Procedure: ABDOMINAL AORTOGRAM W/LOWER EXTREMITY - Right Lower;  Surgeon: Waynetta Sandy, MD;  Location: [facility];  Service: Cardiovascular;  Laterality: N/A;  . AMPUTATION Left 08/19/2018   Procedure: AMPUTATION LEFT TOES SECOND AND THIRD;  Surgeon: Waynetta Sandy, MD;  Location: [place];  Service: Vascular;   Laterality: Left;  . AMPUTATION Right 05/07/2019   Procedure: AMPUTATION BELOW KNEE;  Surgeon: Serafina Mitchell, MD;  Location: [facility];  Service: Vascular;  Laterality: Right;  . AV FISTULA PLACEMENT Right 12/27/2015   Procedure: Right Arm ARTERIOVENOUS (AV) FISTULA CREATION;  Surgeon: Angelia Mould, MD;  Location: [place];  Service: Vascular;  Laterality: Right;  . CATARACT EXTRACTION W/ INTRAOCULAR LENS  IMPLANT, BILATERAL    . COLONOSCOPY    . ERCP W/ SPHICTEROTOMY    . FEMORAL-POPLITEAL BYPASS GRAFT Left 07/22/2018   Procedure: left FEMORAL TO BELOW KNEE POPLITEAL ARTERY BYPASS GREAFT using 32mm removable ring propaten gore graft;  Surgeon: Waynetta Sandy, MD;  Location: [place];  Service: Vascular;  Laterality: Left;  . FEMORAL-POPLITEAL BYPASS GRAFT Right 04/10/2019   Procedure: BYPASS GRAFT RIGHT FEMORAL-POPLITEAL ARTERY;  Surgeon: Rosetta Posner, MD;  Location: [place];  Service: Vascular;  Laterality: Right;  . I & D EXTREMITY Right 05/05/2019   Procedure: Revision of  RIGHT  FOOT Amputation;  Surgeon: Waynetta Sandy, MD;  Location: [place];  Service: Vascular;  Laterality: Right;  . INTRAOPERATIVE TRANSTHORACIC ECHOCARDIOGRAM N/A 10/21/2018   Procedure: Intraoperative Transthoracic Echocardiogram;  Surgeon: Sherren Mocha, MD;  Location: [place];  Service: Open Heart Surgery;  Laterality: N/A;  . IR GENERIC HISTORICAL  01/27/2016   IR FLUORO GUIDE CV LINE RIGHT 01/27/2016 Arne Cleveland, MD [facility]  . IR GENERIC HISTORICAL  01/27/2016   IR US GUIDE VASC ACCESS RIGHT 01/27/2016 Arne Cleveland, MD [facility]  . LAPAROSCOPIC CHOLECYSTECTOMY  03/2011  . LEFT HEART CATH AND CORONARY ANGIOGRAPHY N/A 11/25/2017   Procedure: LEFT HEART CATH AND CORONARY ANGIOGRAPHY;  Surgeon: Martinique, Peter M, MD;  Location: [facility];  Service: Cardiovascular;  Laterality: N/A;  . LEFT HEART CATH AND CORONARY ANGIOGRAPHY N/A 07/24/2018   Procedure: LEFT HEART CATH AND CORONARY  ANGIOGRAPHY;  Surgeon: Sherren Mocha, MD;  Location: [facility];  Service: Cardiovascular;  Laterality: N/A;  . RENAL BIOPSY, PERCUTANEOUS Right 09/08/2014  . RIGHT/LEFT HEART CATH AND CORONARY ANGIOGRAPHY N/A 03/25/2018   Procedure: RIGHT/LEFT HEART CATH AND CORONARY ANGIOGRAPHY;  Surgeon: Martinique, Peter M, MD;  Location: [facility];  Service: Cardiovascular;  Laterality: N/A;  . TEE WITHOUT CARDIOVERSION N/A 05/14/2019   Procedure: TRANSESOPHAGEAL ECHOCARDIOGRAM (TEE);  Surgeon: Fay Records, MD;  Location: [facility];  Service: Cardiovascular;  Laterality: N/A;  . TOE AMPUTATION Left 08/19/2018   2nd & 3rd toes  . TONSILLECTOMY  ~ 1960  . TRANSCATHETER AORTIC VALVE REPLACEMENT, TRANSFEMORAL N/A 10/21/2018   Procedure: TRANSCATHETER AORTIC VALVE REPLACEMENT, TRANSFEMORAL;  Surgeon: Sherren Mocha, MD;  Location: [place];  Service: Open Heart Surgery;  Laterality: N/A;  . TRANSMETATARSAL AMPUTATION Right 04/29/2019   Procedure: RIGHT TRANSMETATARSAL AMPUTATION;  Surgeon: Waynetta Sandy, MD;  Location: [place];  Service: Vascular;  Laterality: Right;  . ULTRASOUND GUIDANCE FOR VASCULAR ACCESS  03/25/2018   Procedure: Ultrasound Guidance For Vascular Access;  Surgeon: Martinique, Peter M, MD;  Location: [place] [facility];  Service: Cardiovascular;;    No Known Allergies  Current Outpatient Medications  Medication Sig Dispense Refill  . acetaminophen (TYLENOL) 325 MG tablet Take 1-2 tablets (325-650 mg total) by mouth every 4 (four) hours as needed for mild pain (or temp >/= 101 F).    [name] amiodarone (PACERONE) 200 MG tablet Take 1 tablet (200 mg total) by mouth daily. 90 tablet 2  . atorvastatin (LIPITOR) 80 MG tablet Take 1 tablet (80 mg total) by mouth daily with supper. 90 tablet 3  . clopidogrel (PLAVIX) 75 MG tablet Take 1 tablet (75 mg total) by mouth daily with supper. 90 tablet 3  . Darbepoetin Alfa (ARANESP) 100 MCG/0.5ML SOSY injection Inject 0.5 mLs (100 mcg total)  into the vein every Friday with hemodialysis. 4.2 mL   . docusate sodium (COLACE) 100 MG capsule Take 1 capsule (100 mg total) by mouth 2 (two) times daily. 10 capsule 0  . LORazepam (ATIVAN) 1 MG tablet Take 1 tablet (1 mg total) by mouth 2 (two) times daily as needed for anxiety. 20 tablet 0  . Melatonin 3 MG TABS Take 0.5 tablets (1.5 mg total) by mouth at bedtime. 30 tablet 0  . multivitamin (RENA-VIT) TABS tablet Take 1 tablet by mouth at bedtime. 30 tablet 0  . oxyCODONE (OXY IR/ROXICODONE) 5 MG immediate release tablet Take 1-2 tablets (5-10 mg total) by mouth every 4 (four) hours as needed for moderate pain. 30 tablet 0  . pantoprazole (PROTONIX) 40 MG tablet Take 1 tablet (40 mg total) by mouth daily. 30 tablet 0  . Vancomycin (VANCOCIN) 750-5 MG/150ML-% SOLN Inject 150 mLs (750 mg total) into the vein every Monday, Wednesday, and Friday with hemodialysis. 4000 mL 0  . vitamin B-12 (CYANOCOBALAMIN) 1000 MCG tablet Take 1 tablet (1,000 mcg total) by mouth every evening. 30 tablet 0   No current facility-administered medications for this visit.    Family History  Problem Relation Age of Onset  . Hypertension Mother   . Diabetes Mother   . Heart disease Mother   . Skin cancer Mother   . Hypertension Father   . Diabetes Father   . Diabetes Sister     Social History   Socioeconomic History  . Marital status: Divorced    Spouse name: Not on file  . Number of children: Not on file  . Years of education: Not on file  . Highest education level: Not on file  Occupational History  . Not on file  Tobacco Use  . Smoking status: Former Smoker    Packs/day: 2.00    Years: 40.00    Pack years: 80.00    Types: Cigarettes    Quit date: 03/19/2011    Years since quitting: 9.1  . Smokeless tobacco: Never Used  Vaping Use  . Vaping Use: Never used  Substance and Sexual Activity  . Alcohol use: No    Alcohol/week: 0.0 standard drinks  . Drug use: No    Comment:  "I have used  marijuana till the 1990's". Notes that he quit 15 yrs ago  . Sexual activity: Not Currently  Other Topics  Concern  . 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    Social Determinants of Health   Financial Resource Strain: Not on file  Food Insecurity: Not on file  Transportation Needs: Not on file  Physical Activity: Not on file  Stress: Not on file  Social Connections: Not on file  Intimate Partner Violence: Not on file     REVIEW OF SYSTEMS:   [X]  denotes positive finding, [ ]  denotes negative finding Cardiac  Comments:  Chest pain or chest pressure:    Shortness of breath upon exertion:    Short of breath when lying flat:    Irregular heart rhythm:        Vascular    Pain in calf, thigh, or hip brought on by ambulation:    Pain in feet at night that wakes you up from your sleep:     Blood clot in your veins:    Leg swelling:         Pulmonary    Oxygen at home:    Productive cough:     Wheezing:         Neurologic    Sudden weakness in arms or legs:     Sudden numbness in arms or legs:     Sudden onset of difficulty speaking or slurred speech:    Temporary loss of vision in one eye:     Problems with dizziness:         Gastrointestinal    Blood in stool:     Vomited blood:         Genitourinary    Burning when urinating:     Blood in urine:        Psychiatric    Major depression:         Hematologic    Bleeding problems:    Problems with blood clotting too easily:        Skin    Rashes or ulcers:        Constitutional    Fever or chills:      PHYSICAL EXAMINATION:  Today's Vitals   05/03/20 1426  BP: (!) 156/65  Pulse: 77  Resp: 18  Temp: 99.7 F (37.6 C)  TempSrc: Temporal  SpO2: 95%  Weight: 183 lb (83 kg)  Height: 5\' 10"  (1.778 m)  PainSc: 5    Body mass index is 26.26 kg/m.   General:   WDWN in NAD; vital signs documented above Gait: Not observed HENT: WNL, normocephalic Pulmonary: normal non-labored breathing , without wheezing Cardiac: regular HR, without  Murmur; with harsh carotid bruit on the right and faint carotid bruit on the left. Abdomen: soft, NT, no masses; aortic pulse is not palpable Skin: without rashes Vascular Exam/Pulses:  Right Left  Radial 2+ (normal) 2+ (normal)  Femoral 2+ (normal) 2+ (normal)  Popliteal Unable to palpate Unable to palpate  AT BKA Brisk biphasic  PT BKA Brisk biphasic-  Peroneal BKA Brisk biphasic   Extremities: without ischemic changes, without Gangrene , without cellulitis; without open wounds; LLE with some swelling Musculoskeletal: no muscle wasting or atrophy  Neurologic: A&O X 3;  No focal weakness or paresthesias are detected Psychiatric:  The pt has Normal affect.   Non-Invasive Vascular Imaging:   ABI's/TBI's on 05/03/2020: Right:  BKA  Left:  [place]/0.25 -  Great toe pressure: 48  LLE Arterial duplex on 05/03/2020: Left Graft #1: femoropopliteal  +--------------------+--------+---------------+----------+--------+            PSV cm/sStenosis    Waveform Comments  +--------------------+--------+---------------+----------+--------+  Inflow       204           monophasic      +--------------------+--------+---------------+----------+--------+  Proximal Anastomosis254   50-70% stenosismonophasic      +--------------------+--------+---------------+----------+--------+  Proximal Graft   272   50-70% stenosismonophasic      +--------------------+--------+---------------+----------+--------+  Mid Graft      88           monophasic      +--------------------+--------+---------------+----------+--------+  Distal Graft    99           monophasic       +--------------------+--------+---------------+----------+--------+  Distal Anastomosis 116           monophasic      +--------------------+--------+---------------+----------+--------+  Outflow       143           monophasic      +--------------------+--------+---------------+----------+--------+   Summary:  Left: Patent femoropopliteal bypass graft with a mild increase in proximal  anastomosis and proximal graft velocities (50-70% by velocity criteria);  however, minimal disease is observed.   Previous ABI's/TBI's on 07/10/2019: Right:  BKA  Left:  [place]/0.41 - Great toe pressure:  77  Previous arterial duplex on 07/10/2019: Left Graft #1:  +--------------------+--------+--------+----------+--------+            PSV cm/sStenosisWaveform Comments  +--------------------+--------+--------+----------+--------+  Inflow       133       monophasic      +--------------------+--------+--------+----------+--------+  Proximal Anastomosis198       monophasic      +--------------------+--------+--------+----------+--------+  Proximal Graft   138       monophasic      +--------------------+--------+--------+----------+--------+  Mid Graft      108       monophasic      +--------------------+--------+--------+----------+--------+  Distal Graft    119       monophasic      +--------------------+--------+--------+----------+--------+  Distal Anastomosis 138       monophasic      +--------------------+--------+--------+----------+--------+  Outflow       169       monophasic      +--------------------+--------+--------+----------+--------+   Summary:  Left: Patent graft with no evidence of stenosis.    ASSESSMENT/PLAN:: 70 y.o. male here for follow up for PAD.  He is s/p left common femoral to below  knee popliteal artery bypass graft with PTFE and third toe amputation on 07/22/18 by Dr. Donzetta Matters. He additionally underwent a right fem to popliteal artery bypass on 04/10/19 and subsequently a right below knee amputation on 05/07/19 due to a poorly healing right transmetatarsal amputation.   PAD -pt doing well and has brisk biphasic doppler signals left foot.  He does not have any rest pain, wounds or claudication.  He does have some numbness on the medial aspect of the leg between the ankle and his below knee incision.  This could be due to some nerve irritation from surgery.  His duplext today does reveal 50-70% stenosis of the left proximal anastomosis and graft.  Given he is not having any issues, I will bring him back in 3 months and repeat the studies.  He will call sooner if he has any non healing wounds, rest pain or any issues before then.    ESRD -right  RC AVF (created in 2017 by Dr. Scot Dock) is working well and has a good thrill present.  He recently underwent endovascular procedure for fistula at [facility] in the past few months. -pt will f/u as needed for this.   Bilateral carotid bruits -pt is asymptomatic.  Discussed with pt and will bring him back in the next few weeks to have carotid duplex.  We will also set up a phone visit to give him the results.   -continue plavix/statin   Leontine Locket, [facility] [phone]  Clinic MD:   Stanford Breed

## 2020-05-04 ENCOUNTER — Ambulatory Visit: Payer: Medicare Other | Admitting: Physician Assistant

## 2020-05-10 ENCOUNTER — Ambulatory Visit: Payer: Medicare Other

## 2020-05-10 ENCOUNTER — Encounter (HOSPITAL_COMMUNITY): Payer: Medicare Other

## 2020-05-10 ENCOUNTER — Other Ambulatory Visit (HOSPITAL_COMMUNITY): Payer: Self-pay | Admitting: Chiropractic Medicine

## 2020-05-11 ENCOUNTER — Ambulatory Visit: Payer: Medicare Other

## 2020-05-11 ENCOUNTER — Other Ambulatory Visit: Payer: Self-pay

## 2020-05-11 DIAGNOSIS — I739 Peripheral vascular disease, unspecified: Secondary | ICD-10-CM

## 2020-05-11 DIAGNOSIS — R0989 Other specified symptoms and signs involving the circulatory and respiratory systems: Secondary | ICD-10-CM

## 2020-05-18 ENCOUNTER — Other Ambulatory Visit (HOSPITAL_COMMUNITY): Payer: Self-pay | Admitting: Nephrology

## 2020-05-24 ENCOUNTER — Other Ambulatory Visit: Payer: Self-pay

## 2020-05-24 ENCOUNTER — Ambulatory Visit (HOSPITAL_COMMUNITY)
Admission: RE | Admit: 2020-05-24 | Discharge: 2020-05-24 | Disposition: A | Discharge status: Home / Self Care | Payer: Medicare Other | Source: Ambulatory Visit | Attending: Vascular Surgery | Admitting: Vascular Surgery

## 2020-05-24 DIAGNOSIS — R0989 Other specified symptoms and signs involving the circulatory and respiratory systems: Secondary | ICD-10-CM

## 2020-05-27 ENCOUNTER — Ambulatory Visit: Payer: Medicare Other

## 2020-05-31 ENCOUNTER — Ambulatory Visit: Payer: Medicare Other

## 2020-06-01 ENCOUNTER — Other Ambulatory Visit (HOSPITAL_COMMUNITY): Payer: Self-pay | Admitting: Nephrology

## 2020-06-02 ENCOUNTER — Ambulatory Visit (INDEPENDENT_AMBULATORY_CARE_PROVIDER_SITE_OTHER): Payer: Medicare Other | Admitting: Physician Assistant

## 2020-06-02 ENCOUNTER — Other Ambulatory Visit: Payer: Self-pay

## 2020-06-02 VITALS — BP 164/80 | Resp 20 | Ht 70.0 in | Wt 176.4 lb

## 2020-06-02 DIAGNOSIS — I6523 Occlusion and stenosis of bilateral carotid arteries: Secondary | ICD-10-CM

## 2020-06-02 DIAGNOSIS — I771 Stricture of artery: Secondary | ICD-10-CM | POA: Diagnosis not present

## 2020-06-02 NOTE — Progress Notes (Addendum)
Virtual Visit via Video Note   I connected with Jose Garrett on 06/02/2020 via telephone and verified that I was speaking with the correct person using two identifiers. Patient was located at home and accompanied by no one. I am located at [place]   The limitations of evaluation and management by telemedicine and the availability of in person appointments have been previously discussed with the patient and are documented in the patients chart. The patient expressed understanding and consented to proceed.   PCP: [name], MD (Inactive)   Chief Complaint: Follow-up carotid artery duplex.   History of Present Illness: Jose Garrett is a 70 y.o. male with 1-39% bilateral ICA stenosis on previous carotid duplex in 2019. There was evidence of right subclavian stenosis as well with retrograde right vertebral artery flow. He dialyzes via right radiocephalic AVF.  Unable to take BP measurements in right arm. He denies monocular blindness, slurred speech, facial drooping or extremity weakness or numbness.  He denies upper extremity pain or weakness with exercise. No complaints of syncope, veritgo or hearing/vision disturbances.  Compliant with Plavix and statin.  Past Medical History:  Diagnosis Date  . Amyloidosis ([place])   . Anemia of chronic disease   . Anxiety   . CAD (coronary artery disease)    a. cardiac cath on 11/25/17 with occlusion of the RCA which could have been his event but the RCA could not be engaged. The RCA filled distally from left to right collaterals. There were no severe stenoses in the left coronary system. Medical therapy recommended, started on plavix.  . Chronic combined systolic and diastolic CHF (congestive heart failure) ([place])    a. EF dropped to 35-40% with grade 2 DD by echo 11/2017.  [name] DJD (degenerative joint disease), lumbar   . ESRD (end stage renal disease) ([place])    ESRD- HEMO MWF- [facility]  . GERD (gastroesophageal reflux disease)   .  Hyperlipidemia   . Hypertension   . Myocardial infarction ([place])   . Neuropathy   . Peripheral vascular disease ([place])   . Pneumonia   . Restless legs   . S/P TAVR (transcatheter aortic valve replacement) 10/21/2018   26 mm Edwards Sapien 3 Ultra transcatheter heart valve placed via percutaneous right transfemoral approach   . Severe aortic stenosis    severe    Past Surgical History:  Procedure Laterality Date  . ABDOMINAL AORTOGRAM W/LOWER EXTREMITY Bilateral 07/16/2018   Procedure: ABDOMINAL AORTOGRAM W/LOWER EXTREMITY;  Surgeon: Wellington Hampshire, MD;  Location: [place] [facility];  Service: Cardiovascular;  Laterality: Bilateral;  . ABDOMINAL AORTOGRAM W/LOWER EXTREMITY N/A 04/09/2019   Procedure: ABDOMINAL AORTOGRAM W/LOWER EXTREMITY - Right Lower;  Surgeon: Waynetta Sandy, MD;  Location: [place] [facility];  Service: Cardiovascular;  Laterality: N/A;  . AMPUTATION Left 08/19/2018   Procedure: AMPUTATION LEFT TOES SECOND AND THIRD;  Surgeon: Waynetta Sandy, MD;  Location: [place];  Service: Vascular;  Laterality: Left;  . AMPUTATION Right 05/07/2019   Procedure: AMPUTATION BELOW KNEE;  Surgeon: Serafina Mitchell, MD;  Location: [facility] OR;  Service: Vascular;  Laterality: Right;  . AV FISTULA PLACEMENT Right 12/27/2015   Procedure: Right Arm ARTERIOVENOUS (AV) FISTULA CREATION;  Surgeon: Angelia Mould, MD;  Location: [place];  Service: Vascular;  Laterality: Right;  . CATARACT EXTRACTION W/ INTRAOCULAR LENS  IMPLANT, BILATERAL    . COLONOSCOPY    . ERCP W/ SPHICTEROTOMY    . FEMORAL-POPLITEAL BYPASS GRAFT  Left 07/22/2018   Procedure: left FEMORAL TO BELOW KNEE POPLITEAL ARTERY BYPASS GREAFT using 88mm removable ring propaten gore graft;  Surgeon: Waynetta Sandy, MD;  Location: [place];  Service: Vascular;  Laterality: Left;  . FEMORAL-POPLITEAL BYPASS GRAFT Right 04/10/2019   Procedure: BYPASS GRAFT RIGHT FEMORAL-POPLITEAL ARTERY;  Surgeon: Rosetta Posner, MD;   Location: [place];  Service: Vascular;  Laterality: Right;  . I & D EXTREMITY Right 05/05/2019   Procedure: Revision of  RIGHT  FOOT Amputation;  Surgeon: Waynetta Sandy, MD;  Location: [place];  Service: Vascular;  Laterality: Right;  . INTRAOPERATIVE TRANSTHORACIC ECHOCARDIOGRAM N/A 10/21/2018   Procedure: Intraoperative Transthoracic Echocardiogram;  Surgeon: Sherren Mocha, MD;  Location: [place];  Service: Open Heart Surgery;  Laterality: N/A;  . IR GENERIC HISTORICAL  01/27/2016   IR FLUORO GUIDE CV LINE RIGHT 01/27/2016 Arne Cleveland, MD [facility]  . IR GENERIC HISTORICAL  01/27/2016   IR US GUIDE VASC ACCESS RIGHT 01/27/2016 Arne Cleveland, MD [facility]  . LAPAROSCOPIC CHOLECYSTECTOMY  03/2011  . LEFT HEART CATH AND CORONARY ANGIOGRAPHY N/A 11/25/2017   Procedure: LEFT HEART CATH AND CORONARY ANGIOGRAPHY;  Surgeon: Martinique, Peter M, MD;  Location: [place] [facility];  Service: Cardiovascular;  Laterality: N/A;  . LEFT HEART CATH AND CORONARY ANGIOGRAPHY N/A 07/24/2018   Procedure: LEFT HEART CATH AND CORONARY ANGIOGRAPHY;  Surgeon: Sherren Mocha, MD;  Location: [place] [facility];  Service: Cardiovascular;  Laterality: N/A;  . RENAL BIOPSY, PERCUTANEOUS Right 09/08/2014  . RIGHT/LEFT HEART CATH AND CORONARY ANGIOGRAPHY N/A 03/25/2018   Procedure: RIGHT/LEFT HEART CATH AND CORONARY ANGIOGRAPHY;  Surgeon: Martinique, Peter M, MD;  Location: [place] [facility];  Service: Cardiovascular;  Laterality: N/A;  . TEE WITHOUT CARDIOVERSION N/A 05/14/2019   Procedure: TRANSESOPHAGEAL ECHOCARDIOGRAM (TEE);  Surgeon: Fay Records, MD;  Location: [facility] ENDOSCOPY;  Service: Cardiovascular;  Laterality: N/A;  . TOE AMPUTATION Left 08/19/2018   2nd & 3rd toes  . TONSILLECTOMY  ~ 1960  . TRANSCATHETER AORTIC VALVE REPLACEMENT, TRANSFEMORAL N/A 10/21/2018   Procedure: TRANSCATHETER AORTIC VALVE REPLACEMENT, TRANSFEMORAL;  Surgeon: Sherren Mocha, MD;  Location: [place];  Service: Open Heart Surgery;   Laterality: N/A;  . TRANSMETATARSAL AMPUTATION Right 04/29/2019   Procedure: RIGHT TRANSMETATARSAL AMPUTATION;  Surgeon: Waynetta Sandy, MD;  Location: [place];  Service: Vascular;  Laterality: Right;  . ULTRASOUND GUIDANCE FOR VASCULAR ACCESS  03/25/2018   Procedure: Ultrasound Guidance For Vascular Access;  Surgeon: Martinique, Peter M, MD;  Location: [place] [facility];  Service: Cardiovascular;;    Current Meds  Medication Sig  . acetaminophen (TYLENOL) 325 MG tablet Take 1-2 tablets (325-650 mg total) by mouth every 4 (four) hours as needed for mild pain (or temp >/= 101 F).  [name] amiodarone (PACERONE) 200 MG tablet Take 1 tablet (200 mg total) by mouth daily.  [name] atorvastatin (LIPITOR) 80 MG tablet Take 1 tablet (80 mg total) by mouth daily with supper.  . clopidogrel (PLAVIX) 75 MG tablet Take 1 tablet (75 mg total) by mouth daily with supper.  . Darbepoetin Alfa (ARANESP) 100 MCG/0.5ML SOSY injection Inject 0.5 mLs (100 mcg total) into the vein every Friday with hemodialysis.  [name] docusate sodium (COLACE) 100 MG capsule Take 1 capsule (100 mg total) by mouth 2 (two) times daily.  [name] LORazepam (ATIVAN) 1 MG tablet Take 1 tablet (1 mg total) by mouth 2 (two) times daily as needed for anxiety.  . Melatonin 3 MG TABS  Take 0.5 tablets (1.5 mg total) by mouth at bedtime.  . multivitamin (RENA-VIT) TABS tablet Take 1 tablet by mouth at bedtime.  [name] oxyCODONE (OXY IR/ROXICODONE) 5 MG immediate release tablet Take 1-2 tablets (5-10 mg total) by mouth every 4 (four) hours as needed for moderate pain.  . pantoprazole (PROTONIX) 40 MG tablet Take 1 tablet (40 mg total) by mouth daily.  . Vancomycin (VANCOCIN) 750-5 MG/150ML-% SOLN Inject 150 mLs (750 mg total) into the vein every Monday, Wednesday, and Friday with hemodialysis.  [name] vitamin B-12 (CYANOCOBALAMIN) 1000 MCG tablet Take 1 tablet (1,000 mcg total) by mouth every evening.    12 system ROS was negative unless otherwise noted in  HPI  Observations/Objective: Right Carotid: Velocities in the right ICA are consistent with a 1-39%  stenosis.   Left Carotid: Velocities in the left ICA are consistent with a 1-39%  stenosis. The ECA appears >50% stenosed.   Vertebrals: Left vertebral artery demonstrates antegrade flow. Right  vertebral artery demonstrates retrograde flow.   Subclavians: Right subclavian artery was stenotic.   Assessment and Plan: Mild bilateral carotid artery stenosis. Asymptomatic right subclavian artery stenosis.  No symptoms referable to carotid artery stenosis or right subclavian steal syndrome.  We reviewed signs and symptoms of stroke/TIA and advised him to call EMS should these occur.  We discussed interval surveillance of carotid duplex.  He will follow-up in May of 2023 with repeat carotid duplex.  Continue Plavix and statin.  Follow Up Instructions:  Follow up in May 2023 or sooner if needed   I discussed the assessment and treatment plan with the patient. The patient was provided an opportunity to ask questions and all were answered. The patient agreed with the plan and demonstrated an understanding of the instructions.   The patient was advised to call back or seek an in-person evaluation if the symptoms worsen or if the condition fails to improve as anticipated.  I spent 10 minutes with the patient via telephone encounter.   Signed, Barbie Banner [facility] Office: [phone]  06/02/2020, 10:53 AM

## 2020-06-09 ENCOUNTER — Ambulatory Visit: Payer: Medicare Other

## 2020-06-28 ENCOUNTER — Encounter: Payer: Self-pay | Admitting: Cardiovascular Disease

## 2020-06-28 ENCOUNTER — Other Ambulatory Visit (HOSPITAL_COMMUNITY): Payer: Self-pay

## 2020-06-28 ENCOUNTER — Other Ambulatory Visit: Payer: Self-pay

## 2020-06-28 ENCOUNTER — Ambulatory Visit: Payer: Medicare Other | Admitting: Cardiovascular Disease

## 2020-06-28 VITALS — BP 140/61 | HR 74 | Ht 70.0 in | Wt 188.0 lb

## 2020-06-28 DIAGNOSIS — E785 Hyperlipidemia, unspecified: Secondary | ICD-10-CM | POA: Diagnosis not present

## 2020-06-28 DIAGNOSIS — I2510 Atherosclerotic heart disease of native coronary artery without angina pectoris: Secondary | ICD-10-CM | POA: Diagnosis not present

## 2020-06-28 DIAGNOSIS — I5022 Chronic systolic (congestive) heart failure: Secondary | ICD-10-CM

## 2020-06-28 DIAGNOSIS — I739 Peripheral vascular disease, unspecified: Secondary | ICD-10-CM

## 2020-06-28 MED ORDER — ONDANSETRON HCL 4 MG PO TABS
ORAL_TABLET | Freq: Two times a day (BID) | ORAL | 0 refills | Status: AC | PRN
  Filled 2020-06-28: qty 60, 30d supply, fill #0

## 2020-06-28 NOTE — Progress Notes (Signed)
Cardiology Office Note   Date:  06/28/2020   ID:  TEVION LAFORGE, DOB 1950/10/21, MRN [id number]  PCP:  [name], MD (Inactive)  Cardiologist: Dr. Angelena Form.  No chief complaint on file.     History of Present Illness: Jose Garrett is a 70 y.o. male who presents for a follow-up visit regarding peripheral arterial disease and critical limb ischemia.  He has extensive medical problems including ESRD on HD, AL amyloid (kidney involvement), aortic stenosis, HTN, HLD, GERD, anemia, chronic combined CHF and CAD .He had TAVR in August 2020.  He is status post left common femoral to below knee popliteal artery bypass graft with PTFE and third toe amputation in May 2020.  He also underwent right femoropopliteal bypass in January 2001 but subsequently underwent right below the knee amputation due to poorly healing transmetatarsal amputation. He is doing reasonably well considering all his complex issues.  He denies left leg claudication and he has no open ulceration.  He has a prosthesis for the right leg.  He has been doing reasonably well with no recent chest pain or worsening dyspnea.  No significant claudication but does complain of numbness.  He underwent Doppler studies at [facility] in March which showed patent left femoral-popliteal bypass with mildly elevated velocities and no significant stenosis.   Past Medical History:  Diagnosis Date  . Amyloidosis ([place])   . Anemia of chronic disease   . Anxiety   . CAD (coronary artery disease)    a. cardiac cath on 11/25/17 with occlusion of the RCA which could have been his event but the RCA could not be engaged. The RCA filled distally from left to right collaterals. There were no severe stenoses in the left coronary system. Medical therapy recommended, started on plavix.  . Chronic combined systolic and diastolic CHF (congestive heart failure) ([place])    a. EF dropped to 35-40% with grade 2 DD by echo 11/2017.  [name] DJD (degenerative joint disease),  lumbar   . ESRD (end stage renal disease) ([place])    ESRD- HEMO MWF- [facility]  . GERD (gastroesophageal reflux disease)   . Hyperlipidemia   . Hypertension   . Myocardial infarction ([place])   . Neuropathy   . Peripheral vascular disease ([place])   . Pneumonia   . Restless legs   . S/P TAVR (transcatheter aortic valve replacement) 10/21/2018   26 mm Edwards Sapien 3 Ultra transcatheter heart valve placed via percutaneous right transfemoral approach   . Severe aortic stenosis    severe    Past Surgical History:  Procedure Laterality Date  . ABDOMINAL AORTOGRAM W/LOWER EXTREMITY Bilateral 07/16/2018   Procedure: ABDOMINAL AORTOGRAM W/LOWER EXTREMITY;  Surgeon: Wellington Hampshire, MD;  Location: [place] [facility];  Service: Cardiovascular;  Laterality: Bilateral;  . ABDOMINAL AORTOGRAM W/LOWER EXTREMITY N/A 04/09/2019   Procedure: ABDOMINAL AORTOGRAM W/LOWER EXTREMITY - Right Lower;  Surgeon: Waynetta Sandy, MD;  Location: [place] [facility];  Service: Cardiovascular;  Laterality: N/A;  . AMPUTATION Left 08/19/2018   Procedure: AMPUTATION LEFT TOES SECOND AND THIRD;  Surgeon: Waynetta Sandy, MD;  Location: [place];  Service: Vascular;  Laterality: Left;  . AMPUTATION Right 05/07/2019   Procedure: AMPUTATION BELOW KNEE;  Surgeon: Serafina Mitchell, MD;  Location: [facility] OR;  Service: Vascular;  Laterality: Right;  . AV FISTULA PLACEMENT Right 12/27/2015   Procedure: Right Arm ARTERIOVENOUS (AV) FISTULA CREATION;  Surgeon: Angelia Mould, MD;  Location: [place];  Service: Vascular;  Laterality: Right;  . CATARACT EXTRACTION W/ INTRAOCULAR LENS  IMPLANT, BILATERAL    . COLONOSCOPY    . ERCP W/ SPHICTEROTOMY    . FEMORAL-POPLITEAL BYPASS GRAFT Left 07/22/2018   Procedure: left FEMORAL TO BELOW KNEE POPLITEAL ARTERY BYPASS GREAFT using 57mm removable ring propaten gore graft;  Surgeon: Waynetta Sandy, MD;  Location: [place];  Service: Vascular;  Laterality: Left;  .  FEMORAL-POPLITEAL BYPASS GRAFT Right 04/10/2019   Procedure: BYPASS GRAFT RIGHT FEMORAL-POPLITEAL ARTERY;  Surgeon: Rosetta Posner, MD;  Location: [place];  Service: Vascular;  Laterality: Right;  . I & D EXTREMITY Right 05/05/2019   Procedure: Revision of  RIGHT  FOOT Amputation;  Surgeon: Waynetta Sandy, MD;  Location: [place];  Service: Vascular;  Laterality: Right;  . INTRAOPERATIVE TRANSTHORACIC ECHOCARDIOGRAM N/A 10/21/2018   Procedure: Intraoperative Transthoracic Echocardiogram;  Surgeon: Sherren Mocha, MD;  Location: [place];  Service: Open Heart Surgery;  Laterality: N/A;  . IR GENERIC HISTORICAL  01/27/2016   IR FLUORO GUIDE CV LINE RIGHT 01/27/2016 Arne Cleveland, MD [facility]  . IR GENERIC HISTORICAL  01/27/2016   IR US GUIDE VASC ACCESS RIGHT 01/27/2016 Arne Cleveland, MD [facility]  . LAPAROSCOPIC CHOLECYSTECTOMY  03/2011  . LEFT HEART CATH AND CORONARY ANGIOGRAPHY N/A 11/25/2017   Procedure: LEFT HEART CATH AND CORONARY ANGIOGRAPHY;  Surgeon: Martinique, Peter M, MD;  Location: [place] [facility];  Service: Cardiovascular;  Laterality: N/A;  . LEFT HEART CATH AND CORONARY ANGIOGRAPHY N/A 07/24/2018   Procedure: LEFT HEART CATH AND CORONARY ANGIOGRAPHY;  Surgeon: Sherren Mocha, MD;  Location: [place] [facility];  Service: Cardiovascular;  Laterality: N/A;  . RENAL BIOPSY, PERCUTANEOUS Right 09/08/2014  . RIGHT/LEFT HEART CATH AND CORONARY ANGIOGRAPHY N/A 03/25/2018   Procedure: RIGHT/LEFT HEART CATH AND CORONARY ANGIOGRAPHY;  Surgeon: Martinique, Peter M, MD;  Location: [place] [facility];  Service: Cardiovascular;  Laterality: N/A;  . TEE WITHOUT CARDIOVERSION N/A 05/14/2019   Procedure: TRANSESOPHAGEAL ECHOCARDIOGRAM (TEE);  Surgeon: Fay Records, MD;  Location: [facility] ENDOSCOPY;  Service: Cardiovascular;  Laterality: N/A;  . TOE AMPUTATION Left 08/19/2018   2nd & 3rd toes  . TONSILLECTOMY  ~ 1960  . TRANSCATHETER AORTIC VALVE REPLACEMENT, TRANSFEMORAL N/A 10/21/2018   Procedure:  TRANSCATHETER AORTIC VALVE REPLACEMENT, TRANSFEMORAL;  Surgeon: Sherren Mocha, MD;  Location: [place];  Service: Open Heart Surgery;  Laterality: N/A;  . TRANSMETATARSAL AMPUTATION Right 04/29/2019   Procedure: RIGHT TRANSMETATARSAL AMPUTATION;  Surgeon: Waynetta Sandy, MD;  Location: [place];  Service: Vascular;  Laterality: Right;  . ULTRASOUND GUIDANCE FOR VASCULAR ACCESS  03/25/2018   Procedure: Ultrasound Guidance For Vascular Access;  Surgeon: Martinique, Peter M, MD;  Location: [place] [facility];  Service: Cardiovascular;;     Current Outpatient Medications  Medication Sig Dispense Refill  . acetaminophen (TYLENOL) 325 MG tablet Take 1-2 tablets (325-650 mg total) by mouth every 4 (four) hours as needed for mild pain (or temp >/= 101 F).    [name] atorvastatin (LIPITOR) 80 MG tablet TAKE 1 TABLET BY MOUTH DAILY WITH SUPPER 90 tablet 3  . clopidogrel (PLAVIX) 75 MG tablet TAKE 1 TABLET BY MOUTH DAILY WITH SUPPER 90 tablet 3  . cyclobenzaprine (FLEXERIL) 10 MG tablet TAKE 1 TABLET BY MOUTH 3 TIMES A DAY AS NEEDED. 30 tablet 0  . Darbepoetin Alfa (ARANESP) 100 MCG/0.5ML SOSY injection Inject 0.5 mLs (100 mcg total) into the vein every Friday with hemodialysis. 4.2 mL   . diazepam (VALIUM)  10 MG tablet TAKE 1 TABLET BY MOUTH PRIOR TO MRI/PROCEDURE. MAY REPEAT ONCE AS DIRECTED 2 tablet 0  . docusate sodium (COLACE) 100 MG capsule Take 1 capsule (100 mg total) by mouth 2 (two) times daily. 10 capsule 0  . lidocaine (LIDODERM) 5 % APPLY 1 PATCH TOPICALLY ONCE A DAY. MAY WEAR UP TO 12 HOURS. 30 patch 2  . LORazepam (ATIVAN) 1 MG tablet Take 1 tablet (1 mg total) by mouth 2 (two) times daily as needed for anxiety. 20 tablet 0  . Melatonin 3 MG TABS Take 0.5 tablets (1.5 mg total) by mouth at bedtime. 30 tablet 0  . multivitamin (RENA-VIT) TABS tablet Take 1 tablet by mouth at bedtime. 30 tablet 0  . oxyCODONE (OXY IR/ROXICODONE) 5 MG immediate release tablet Take 1-2 tablets (5-10 mg total) by  mouth every 4 (four) hours as needed for moderate pain. 30 tablet 0  . Oxycodone HCl 10 MG TABS TAKE 1 TABLET BY MOUTH 4 TIMES DAILY AS NEEDED 120 tablet 0  . pantoprazole (PROTONIX) 40 MG tablet Take 1 tablet (40 mg total) by mouth daily. 30 tablet 0  . tiZANidine (ZANAFLEX) 2 MG tablet TAKE 1 TABLET BY MOUTH 3 TIMES DAILY AS NEEDED FOR MUSCLE SPASMS 90 tablet 0  . Vancomycin (VANCOCIN) 750-5 MG/150ML-% SOLN Inject 150 mLs (750 mg total) into the vein every Monday, Wednesday, and Friday with hemodialysis. 4000 mL 0  . vitamin B-12 (CYANOCOBALAMIN) 1000 MCG tablet Take 1 tablet (1,000 mcg total) by mouth every evening. 30 tablet 0  . amiodarone (PACERONE) 200 MG tablet TAKE 1 TABLET (200 MG TOTAL) BY MOUTH DAILY. 90 tablet 2  . ondansetron (ZOFRAN) 4 MG tablet TAKE 1 TABLET BY MOUTH TWICE A DAY AS NEEDED 60 tablet 0   No current facility-administered medications for this visit.    Allergies:   Patient has no known allergies.    Social History:  The patient  reports that he quit smoking about 9 years ago. His smoking use included cigarettes. He has a 80.00 pack-year smoking history. He has never used smokeless tobacco. He reports that he does not drink alcohol and does not use drugs.   Family History:  The patient's family history includes Diabetes in his father, mother, and sister; Heart disease in his mother; Hypertension in his father and mother; Skin cancer in his mother.    ROS:  Please see the history of present illness.   Otherwise, review of systems are positive for none.   All other systems are reviewed and negative.    PHYSICAL EXAM: VS:  BP 140/61   Pulse 74   Ht 5\' 10"  (1.778 m)   Wt 188 lb (85.3 kg)   SpO2 (!) 84%   BMI 26.98 kg/m  , BMI Body mass index is 26.98 kg/m. GEN: Well nourished, well developed, in no acute distress  HEENT: normal  Neck: no JVD, or masses. Bilateral carotid bruits Cardiac: RRR; no rubs, or gallops,no edema . 2/6 systolic murmur at the aortic  area Respiratory:  clear to auscultation bilaterally, normal work of breathing GI: soft, nontender, nondistended, + BS MS: no deformity or atrophy  Skin: warm and dry, no rash Neuro:  Strength and sensation are intact Psych: euthymic mood, full affect Vascular: Femoral pulses slightly diminished bilaterally.  Right below the knee amputation.   EKG:  EKG is not ordered today.    Recent Labs: 05/03/2020: ALT 13    Lipid Panel    Component Value Date/Time  CHOL 129 05/03/2020 0728   TRIG 172 (H) 05/03/2020 0728   HDL 32 (L) 05/03/2020 0728   CHOLHDL 4.0 05/03/2020 0728   CHOLHDL 7.4 02/25/2014 0959   VLDL 40 02/25/2014 0959   LDLCALC 68 05/03/2020 0728      Wt Readings from Last 3 Encounters:  06/28/20 188 lb (85.3 kg)  06/02/20 176 lb 5.9 oz (80 kg)  05/03/20 183 lb (83 kg)       No flowsheet data found.    ASSESSMENT AND PLAN:  1. Peripheral arterial disease with previous critical limb ischemia manifested by gangrenous toes on the left side.  He is status post femoral-popliteal bypass and resection of affected toes .  He is status post right BKA due to gangrene.  Most recent Doppler in March showed patent left femoral-popliteal bypass.  Continue medical therapy.  Continue long-term clopidogrel treatment.  2. Coronary artery disease involving native coronary arteries: He is being treated medically with no significant angina.  3. Chronic systolic heart failure: Seems to be stable.  He is not able to tolerate heart failure medications due to hypotension during dialysis.  4. End-stage renal disease on hemodialysis Monday Wednesday Friday.  5. Hyperlipidemia: Continue high-dose atorvastatin.  Most recent lipid profile showed an LDL of 68.  6. Severe aortic stenosis: Status post TAVR in August 2020.  7.  History of nonsustained ventricular tachycardia post TAVR: Currently on amiodarone.    Disposition:   FU with me in 6 months  Signed,  Kathlyn Sacramento, MD   06/28/2020 12:44 PM    [place]

## 2020-06-28 NOTE — Patient Instructions (Signed)
Medication Instructions:  No changes *If you need a refill on your cardiac medications before your next appointment, please call your pharmacy*   Lab Work: None ordered If you have labs (blood work) drawn today and your tests are completely normal, you will receive your results only by: . MyChart Message (if you have MyChart) OR . A paper copy in the mail If you have any lab test that is abnormal or we need to change your treatment, we will call you to review the results.   Testing/Procedures: None ordered   Follow-Up: At [facility], you and your health needs are our priority.  As part of our continuing mission to provide you with exceptional heart care, we have created designated Provider Care Teams.  These Care Teams include your primary Cardiologist (physician) and Advanced Practice Providers (APPs -  Physician Assistants and Nurse Practitioners) who all work together to provide you with the care you need, when you need it.  We recommend signing up for the patient portal called "MyChart".  Sign up information is provided on this After Visit Summary.  MyChart is used to connect with patients for Virtual Visits (Telemedicine).  Patients are able to view lab/test results, encounter notes, upcoming appointments, etc.  Non-urgent messages can be sent to your provider as well.   To learn more about what you can do with MyChart, go to [email or web address].    Your next appointment:   6 month(s)  The format for your next appointment:   In Person  Provider:   Muhammad Arida, MD  

## 2020-07-18 ENCOUNTER — Other Ambulatory Visit: Payer: Self-pay | Admitting: Cardiovascular Disease

## 2020-07-18 ENCOUNTER — Other Ambulatory Visit: Payer: Self-pay | Admitting: Family Medicine

## 2020-07-18 ENCOUNTER — Other Ambulatory Visit (HOSPITAL_COMMUNITY): Payer: Self-pay

## 2020-07-21 ENCOUNTER — Other Ambulatory Visit (HOSPITAL_COMMUNITY): Payer: Self-pay

## 2020-07-21 MED ORDER — PANTOPRAZOLE SODIUM 40 MG PO TBEC
DELAYED_RELEASE_TABLET | ORAL | 3 refills | Status: DC
  Filled 2020-07-21: qty 90, 90d supply, fill #0

## 2020-07-25 ENCOUNTER — Encounter (HOSPITAL_COMMUNITY): Payer: Medicare Other

## 2020-07-25 ENCOUNTER — Ambulatory Visit: Payer: Medicare Other

## 2020-07-28 ENCOUNTER — Ambulatory Visit (HOSPITAL_COMMUNITY)
Admission: RE | Admit: 2020-07-28 | Discharge: 2020-07-28 | Disposition: A | Discharge status: Home / Self Care | Payer: Medicare Other | Source: Ambulatory Visit | Attending: Vascular Surgery | Admitting: Vascular Surgery

## 2020-07-28 ENCOUNTER — Other Ambulatory Visit: Payer: Self-pay

## 2020-07-28 ENCOUNTER — Ambulatory Visit (INDEPENDENT_AMBULATORY_CARE_PROVIDER_SITE_OTHER): Payer: Medicare Other | Admitting: Physician Assistant

## 2020-07-28 ENCOUNTER — Ambulatory Visit (INDEPENDENT_AMBULATORY_CARE_PROVIDER_SITE_OTHER)
Admission: RE | Admit: 2020-07-28 | Discharge: 2020-07-28 | Disposition: A | Discharge status: Home / Self Care | Payer: Medicare Other | Source: Ambulatory Visit | Attending: Physician Assistant | Admitting: Physician Assistant

## 2020-07-28 VITALS — BP 186/69 | HR 72 | Temp 98.5°F | Resp 20 | Ht 70.0 in | Wt 185.7 lb

## 2020-07-28 DIAGNOSIS — I739 Peripheral vascular disease, unspecified: Secondary | ICD-10-CM | POA: Insufficient documentation

## 2020-07-28 NOTE — Progress Notes (Signed)
HISTORY AND PHYSICAL     CC:  follow up. Requesting Provider:  Gabriel Earing, PA-C  HPI: This is a 70 y.o. male who is here today for follow up for PAD.  He is s/p left common femoral to below knee popliteal artery bypass graft with PTFE and third toe amputation on 07/22/18 by Dr. Donzetta Matters. He additionally underwent a right fem to popliteal artery bypass on 04/10/19 and subsequently a right below knee amputation on 05/07/19 due to a poorly healing right transmetatarsal amputation.  A carotid bruit was heard at his visit on 3/22 and he was sent for carotid duplex and this was 1-39% bilaterally.  He comes in today for his 3 month visit for non invasive studies.   The pt returns today for follow up.  He states that he is doing pretty good.  He gets fatigued pretty easily since he is not as active as he was before his amputation.  He is wearing his prosthesis and it is getting better with time but has been an adjustment.  He states his left leg is doing well without claudication or rest pain or wounds.  He states that his fistula is working well.  He is on HD M/W/F at the [facility] location.   The pt is on a statin for cholesterol management.    The pt is not on an aspirin.    Other AC:  Plavix The pt is not on medication for hypertension.  The pt does not have diabetes. Tobacco hx:  former   Past Medical History:  Diagnosis Date  . Amyloidosis ([place])   . Anemia of chronic disease   . Anxiety   . CAD (coronary artery disease)    a. cardiac cath on 11/25/17 with occlusion of the RCA which could have been his event but the RCA could not be engaged. The RCA filled distally from left to right collaterals. There were no severe stenoses in the left coronary system. Medical therapy recommended, started on plavix.  . Chronic combined systolic and diastolic CHF (congestive heart failure) ([place])    a. EF dropped to 35-40% with grade 2 DD by echo 11/2017.  [name] DJD (degenerative joint disease), lumbar   . ESRD  (end stage renal disease) ([place])    ESRD- HEMO MWF- [facility]  . GERD (gastroesophageal reflux disease)   . Hyperlipidemia   . Hypertension   . Myocardial infarction ([place])   . Neuropathy   . Peripheral vascular disease ([place])   . Pneumonia   . Restless legs   . S/P TAVR (transcatheter aortic valve replacement) 10/21/2018   26 mm Edwards Sapien 3 Ultra transcatheter heart valve placed via percutaneous right transfemoral approach   . Severe aortic stenosis    severe    Past Surgical History:  Procedure Laterality Date  . ABDOMINAL AORTOGRAM W/LOWER EXTREMITY Bilateral 07/16/2018   Procedure: ABDOMINAL AORTOGRAM W/LOWER EXTREMITY;  Surgeon: Wellington Hampshire, MD;  Location: [place] [facility];  Service: Cardiovascular;  Laterality: Bilateral;  . ABDOMINAL AORTOGRAM W/LOWER EXTREMITY N/A 04/09/2019   Procedure: ABDOMINAL AORTOGRAM W/LOWER EXTREMITY - Right Lower;  Surgeon: Waynetta Sandy, MD;  Location: [place] [facility];  Service: Cardiovascular;  Laterality: N/A;  . AMPUTATION Left 08/19/2018   Procedure: AMPUTATION LEFT TOES SECOND AND THIRD;  Surgeon: Waynetta Sandy, MD;  Location: [place];  Service: Vascular;  Laterality: Left;  . AMPUTATION Right 05/07/2019   Procedure: AMPUTATION BELOW KNEE;  Surgeon: Serafina Mitchell, MD;  Location: [facility];  Service: Vascular;  Laterality: Right;  . AV FISTULA PLACEMENT Right 12/27/2015   Procedure: Right Arm ARTERIOVENOUS (AV) FISTULA CREATION;  Surgeon: Angelia Mould, MD;  Location: [place];  Service: Vascular;  Laterality: Right;  . CATARACT EXTRACTION W/ INTRAOCULAR LENS  IMPLANT, BILATERAL    . COLONOSCOPY    . ERCP W/ SPHICTEROTOMY    . FEMORAL-POPLITEAL BYPASS GRAFT Left 07/22/2018   Procedure: left FEMORAL TO BELOW KNEE POPLITEAL ARTERY BYPASS GREAFT using 48mm removable ring propaten gore graft;  Surgeon: Waynetta Sandy, MD;  Location: [place];  Service: Vascular;  Laterality: Left;  . FEMORAL-POPLITEAL BYPASS  GRAFT Right 04/10/2019   Procedure: BYPASS GRAFT RIGHT FEMORAL-POPLITEAL ARTERY;  Surgeon: Rosetta Posner, MD;  Location: [place];  Service: Vascular;  Laterality: Right;  . I & D EXTREMITY Right 05/05/2019   Procedure: Revision of  RIGHT  FOOT Amputation;  Surgeon: Waynetta Sandy, MD;  Location: [place];  Service: Vascular;  Laterality: Right;  . INTRAOPERATIVE TRANSTHORACIC ECHOCARDIOGRAM N/A 10/21/2018   Procedure: Intraoperative Transthoracic Echocardiogram;  Surgeon: Sherren Mocha, MD;  Location: [place];  Service: Open Heart Surgery;  Laterality: N/A;  . IR GENERIC HISTORICAL  01/27/2016   IR FLUORO GUIDE CV LINE RIGHT 01/27/2016 Arne Cleveland, MD [facility]  . IR GENERIC HISTORICAL  01/27/2016   IR US GUIDE VASC ACCESS RIGHT 01/27/2016 Arne Cleveland, MD [facility]  . LAPAROSCOPIC CHOLECYSTECTOMY  03/2011  . LEFT HEART CATH AND CORONARY ANGIOGRAPHY N/A 11/25/2017   Procedure: LEFT HEART CATH AND CORONARY ANGIOGRAPHY;  Surgeon: Martinique, Peter M, MD;  Location: [place] [facility];  Service: Cardiovascular;  Laterality: N/A;  . LEFT HEART CATH AND CORONARY ANGIOGRAPHY N/A 07/24/2018   Procedure: LEFT HEART CATH AND CORONARY ANGIOGRAPHY;  Surgeon: Sherren Mocha, MD;  Location: [place] [facility];  Service: Cardiovascular;  Laterality: N/A;  . RENAL BIOPSY, PERCUTANEOUS Right 09/08/2014  . RIGHT/LEFT HEART CATH AND CORONARY ANGIOGRAPHY N/A 03/25/2018   Procedure: RIGHT/LEFT HEART CATH AND CORONARY ANGIOGRAPHY;  Surgeon: Martinique, Peter M, MD;  Location: [place] [facility];  Service: Cardiovascular;  Laterality: N/A;  . TEE WITHOUT CARDIOVERSION N/A 05/14/2019   Procedure: TRANSESOPHAGEAL ECHOCARDIOGRAM (TEE);  Surgeon: Fay Records, MD;  Location: [facility]OSCOPY;  Service: Cardiovascular;  Laterality: N/A;  . TOE AMPUTATION Left 08/19/2018   2nd & 3rd toes  . TONSILLECTOMY  ~ 1960  . TRANSCATHETER AORTIC VALVE REPLACEMENT, TRANSFEMORAL N/A 10/21/2018   Procedure: TRANSCATHETER AORTIC VALVE  REPLACEMENT, TRANSFEMORAL;  Surgeon: Sherren Mocha, MD;  Location: [place];  Service: Open Heart Surgery;  Laterality: N/A;  . TRANSMETATARSAL AMPUTATION Right 04/29/2019   Procedure: RIGHT TRANSMETATARSAL AMPUTATION;  Surgeon: Waynetta Sandy, MD;  Location: [place];  Service: Vascular;  Laterality: Right;  . ULTRASOUND GUIDANCE FOR VASCULAR ACCESS  03/25/2018   Procedure: Ultrasound Guidance For Vascular Access;  Surgeon: Martinique, Peter M, MD;  Location: [place] [facility];  Service: Cardiovascular;;    No Known Allergies  Current Outpatient Medications  Medication Sig Dispense Refill  . acetaminophen (TYLENOL) 325 MG tablet Take 1-2 tablets (325-650 mg total) by mouth every 4 (four) hours as needed for mild pain (or temp >/= 101 F).    [name] amiodarone (PACERONE) 200 MG tablet TAKE 1 TABLET (200 MG TOTAL) BY MOUTH DAILY. 90 tablet 2  . atorvastatin (LIPITOR) 80 MG tablet TAKE 1 TABLET BY MOUTH DAILY WITH SUPPER 90 tablet 3  . clopidogrel (PLAVIX) 75 MG tablet  TAKE 1 TABLET BY MOUTH DAILY WITH SUPPER 90 tablet 3  . cyclobenzaprine (FLEXERIL) 10 MG tablet TAKE 1 TABLET BY MOUTH 3 TIMES A DAY AS NEEDED. 30 tablet 0  . Darbepoetin Alfa (ARANESP) 100 MCG/0.5ML SOSY injection Inject 0.5 mLs (100 mcg total) into the vein every Friday with hemodialysis. 4.2 mL   . docusate sodium (COLACE) 100 MG capsule Take 1 capsule (100 mg total) by mouth 2 (two) times daily. 10 capsule 0  . lidocaine (LIDODERM) 5 % APPLY 1 PATCH TOPICALLY ONCE A DAY. MAY WEAR UP TO 12 HOURS. 30 patch 2  . LORazepam (ATIVAN) 1 MG tablet Take 1 tablet (1 mg total) by mouth 2 (two) times daily as needed for anxiety. 20 tablet 0  . Melatonin 3 MG TABS Take 0.5 tablets (1.5 mg total) by mouth at bedtime. 30 tablet 0  . multivitamin (RENA-VIT) TABS tablet Take 1 tablet by mouth at bedtime. 30 tablet 0  . ondansetron (ZOFRAN) 4 MG tablet TAKE 1 TABLET BY MOUTH TWICE A DAY AS NEEDED 60 tablet 0  . oxyCODONE (OXY IR/ROXICODONE) 5 MG  immediate release tablet Take 1-2 tablets (5-10 mg total) by mouth every 4 (four) hours as needed for moderate pain. 30 tablet 0  . Oxycodone HCl 10 MG TABS TAKE 1 TABLET BY MOUTH 4 TIMES DAILY AS NEEDED 120 tablet 0  . pantoprazole (PROTONIX) 40 MG tablet Take 1 tablet (40 mg total) by mouth daily. 30 tablet 0  . pantoprazole (PROTONIX) 40 MG tablet Take 1 tablet (40 mg) by mouth once daily 90 tablet 3  . tiZANidine (ZANAFLEX) 2 MG tablet TAKE 1 TABLET BY MOUTH 3 TIMES DAILY AS NEEDED FOR MUSCLE SPASMS 90 tablet 0  . Vancomycin (VANCOCIN) 750-5 MG/150ML-% SOLN Inject 150 mLs (750 mg total) into the vein every Monday, Wednesday, and Friday with hemodialysis. 4000 mL 0  . vitamin B-12 (CYANOCOBALAMIN) 1000 MCG tablet Take 1 tablet (1,000 mcg total) by mouth every evening. 30 tablet 0   No current facility-administered medications for this visit.    Family History  Problem Relation Age of Onset  . Hypertension Mother   . Diabetes Mother   . Heart disease Mother   . Skin cancer Mother   . Hypertension Father   . Diabetes Father   . Diabetes Sister     Social History   Socioeconomic History  . Marital status: Divorced    Spouse name: Not on file  . Number of children: Not on file  . Years of education: Not on file  . Highest education level: Not on file  Occupational History  . Not on file  Tobacco Use  . Smoking status: Former Smoker    Packs/day: 2.00    Years: 40.00    Pack years: 80.00    Types: Cigarettes    Quit date: 03/19/2011    Years since quitting: 9.3  . Smokeless tobacco: Never Used  Vaping Use  . Vaping Use: Never used  Substance and Sexual Activity  . Alcohol use: No    Alcohol/week: 0.0 standard drinks  . Drug use: No    Comment:  "I have used marijuana till the 1990's". Notes that he quit 15 yrs ago  . Sexual activity: Not Currently  Other Topics Concern  . Not on file  Social History Narrative   Pt lives alone in a 1 story home   Has 3 adult daughters    Highest level of education: associates degree   Worked in  [employer], broadcasting/film/video.    Social Determinants of Health   Financial Resource Strain: Not on file  Food Insecurity: Not on file  Transportation Needs: Not on file  Physical Activity: Not on file  Stress: Not on file  Social Connections: Not on file  Intimate Partner Violence: Not on file     REVIEW OF SYSTEMS:   [X]  denotes positive finding, [ ]  denotes negative finding Cardiac  Comments:  Chest pain or chest pressure:    Shortness of breath upon exertion:    Short of breath when lying flat:    Irregular heart rhythm:        Vascular    Pain in calf, thigh, or hip brought on by ambulation:    Pain in feet at night that wakes you up from your sleep:     Blood clot in your veins:    Leg swelling:         Pulmonary    Oxygen at home:    Productive cough:     Wheezing:         Neurologic    Sudden weakness in arms or legs:     Sudden numbness in arms or legs:     Sudden onset of difficulty speaking or slurred speech:    Temporary loss of vision in one eye:     Problems with dizziness:         Gastrointestinal    Blood in stool:     Vomited blood:         Genitourinary    Burning when urinating:     Blood in urine:        Psychiatric    Major depression:         Hematologic    Bleeding problems:    Problems with blood clotting too easily:        Skin    Rashes or ulcers:        Constitutional    Fever or chills:      PHYSICAL EXAMINATION:  Today's Vitals   07/28/20 1456  BP: (!) 186/69  Pulse: 72  Resp: 20  Temp: 98.5 F (36.9 C)  TempSrc: Temporal  SpO2: 97%  Weight: 185 lb 11.2 oz (84.2 kg)  Height: 5\' 10"  (1.778 m)   Body mass index is 26.65 kg/m.   General:  WDWN in NAD; vital signs documented above Gait: Not observed HENT: WNL, normocephalic Pulmonary: normal non-labored breathing , without wheezing Cardiac: regular HR, without  Murmur; with carotid bruits  bilaterally Abdomen: soft, NT, no masses; aortic pulse is not palpable Skin: without rashes Vascular Exam/Pulses:  Right Left  Radial 2+ (normal) 2+ (normal)  Femoral 2+ (normal) 2+ (normal)  DP BKA Brisk monophasic  PT BKA Brisk biphasic   Peroneal BKA Brisk monophasic   Extremities: without ischemic changes, without Gangrene , without cellulitis; without open wounds; toe amp site remains well healed. Musculoskeletal: no muscle wasting or atrophy  Neurologic: A&O X 3;  No focal weakness or paresthesias are detected Psychiatric:  The pt has Normal affect.   Non-Invasive Vascular Imaging:   ABI's/TBI's on 07/28/2020: Right:  BKA Left:  0.52/0.29 - Great toe pressure: 74  LLE Arterial duplex on 07/28/2020: Left Graft #1:  +--------------------+--------+---------------+----------+--------+            PSV cm/sStenosis    Waveform Comments  +--------------------+--------+---------------+----------+--------+  Inflow       303   50-74% stenosisbiphasic       +--------------------+--------+---------------+----------+--------+  Proximal Anastomosis326   >70% stenosis biphasic       +--------------------+--------+---------------+----------+--------+  Proximal Graft   93           monophasic      +--------------------+--------+---------------+----------+--------+  Mid Graft      158           monophasic      +--------------------+--------+---------------+----------+--------+  Distal Graft    105           monophasic      +--------------------+--------+---------------+----------+--------+  Distal Anastomosis 97           monophasic      +--------------------+--------+---------------+----------+--------+  Outflow       133           monophasic      +--------------------+--------+---------------+----------+--------+    Summary:  Left: Patent Femoral popliteal bypass. 50-74% stenosis at the inflow  artery and >70% stenosis at proximal anastomosis.   Previous ABI's/TBI's on 05/03/2020: Right:  BKA  Left:  /0.25 - Great toe pressure:  48  Previous LLE arterial duplex on 05/03/2020: Left Graft #1: femoropopliteal  +--------------------+--------+---------------+----------+--------+            PSV cm/sStenosis    Waveform Comments  +--------------------+--------+---------------+----------+--------+  Inflow       204           monophasic      +--------------------+--------+---------------+----------+--------+  Proximal Anastomosis254   50-70% stenosismonophasic      +--------------------+--------+---------------+----------+--------+  Proximal Graft   272   50-70% stenosismonophasic      +--------------------+--------+---------------+----------+--------+  Mid Graft      88           monophasic      +--------------------+--------+---------------+----------+--------+  Distal Graft    99           monophasic      +--------------------+--------+---------------+----------+--------+  Distal Anastomosis 116           monophasic      +--------------------+--------+---------------+----------+--------+  Outflow       143           monophasic      +--------------------+--------+---------------+----------+--------+   Summary:  Left: Patent femoropopliteal bypass graft with a mild increase in proximal anastomosis and proximal graft velocities (50-70% by velocity criteria); however, minimal disease is observed.    ASSESSMENT/PLAN:: 70 y.o. male here for follow up for left common femoral to below knee popliteal artery bypass graft with PTFE and third toe amputation on 07/22/18 by Dr. Donzetta Matters. He additionally underwent a right fem to popliteal artery bypass  on 04/10/19 and subsequently a right below knee amputation on 05/07/19 due to a poorly healing right transmetatarsal amputation.  PAD -pt doing well as far as his LLE.  He does not have rest pain or non healing wounds.  He is doing ok with his prosthesis.   -pt does have elevated velocities at the proximal anastomosis that suggest >70% stenosis.   He has a brisk biphasic left PT doppler signal and brisk monophasic DP doppler signal.  Given this, and his toe pressure has increased, will have him come back in 3 months with LLE arterial duplex and ABI.  Pt is in agreement with this plan.  He knows to call if he develops non healing wounds or rest pain.  -he is trying to get in with podiatrist to have his nails trimmed   Carotid bruit -carotid duplex last month revealed 1-39% bilateral ICA stenosis and remains asymptomatic.    ESRD -fistula with excellent thrill and working well  Leontine Locket, [facility] [phone]  Clinic MD:   Scot Dock

## 2020-08-02 ENCOUNTER — Encounter (HOSPITAL_COMMUNITY): Payer: Medicare Other

## 2020-08-02 ENCOUNTER — Ambulatory Visit: Payer: Medicare Other

## 2020-08-18 ENCOUNTER — Other Ambulatory Visit: Payer: Self-pay

## 2020-08-18 ENCOUNTER — Other Ambulatory Visit: Payer: Self-pay | Admitting: Registered Nurse

## 2020-08-18 ENCOUNTER — Ambulatory Visit
Admission: RE | Admit: 2020-08-18 | Discharge: 2020-08-18 | Disposition: A | Discharge status: Home / Self Care | Payer: Medicare Other | Source: Ambulatory Visit | Attending: Registered Nurse | Admitting: Registered Nurse

## 2020-08-18 DIAGNOSIS — M25532 Pain in left wrist: Secondary | ICD-10-CM

## 2020-08-18 DIAGNOSIS — M25432 Effusion, left wrist: Secondary | ICD-10-CM

## 2020-08-23 ENCOUNTER — Other Ambulatory Visit: Payer: Self-pay

## 2020-08-23 ENCOUNTER — Other Ambulatory Visit (HOSPITAL_COMMUNITY): Payer: Self-pay

## 2020-08-23 MED ORDER — OXYCODONE HCL 10 MG PO TABS
ORAL_TABLET | ORAL | 0 refills | Status: DC
  Filled 2020-08-23: qty 60, 15d supply, fill #0

## 2020-08-29 ENCOUNTER — Other Ambulatory Visit (HOSPITAL_COMMUNITY): Payer: Self-pay

## 2020-08-30 ENCOUNTER — Other Ambulatory Visit: Payer: Self-pay

## 2020-08-30 ENCOUNTER — Encounter: Payer: Self-pay | Admitting: Podiatry

## 2020-08-30 ENCOUNTER — Ambulatory Visit (INDEPENDENT_AMBULATORY_CARE_PROVIDER_SITE_OTHER): Payer: Medicare Other | Admitting: Podiatry

## 2020-08-30 DIAGNOSIS — M79675 Pain in left toe(s): Secondary | ICD-10-CM | POA: Insufficient documentation

## 2020-08-30 DIAGNOSIS — B351 Tinea unguium: Secondary | ICD-10-CM

## 2020-08-30 NOTE — Progress Notes (Signed)
This patient returns to my office for at risk foot care.  This patient requires this care by a professional since this patient will be at risk due to having ESRD, PAD, Thrombocytopenia and coagulation defect.  Patient is taking plavix.  Patient has right leg amputated and 2,3 toe foot amputated.    This patient is unable to cut nails himself since the patient cannot reach his nails. Left foot.These nails are painful walking and wearing shoes.  This patient presents for at risk foot care today. Patient has not been seen since 2020.  General Appearance  Alert, conversant and in no acute stress.  Vascular  Dorsalis pedis and posterior tibial  pulses are absent left foot.  Cold feet noted left.  Absent digital hair left.  Neurologic  Senn-Weinstein monofilament wire test diminished left. Muscle power within normal limits left.  Nails Thick disfigured discolored nails with subungual debris  1,4,5 left foot. No evidence of bacterial infection or drainage bilaterally.  Orthopedic  No limitations of motion  feet .  No crepitus or effusions noted.  No bony pathology or digital deformities noted. Amputation 2,3 digits left foot. Amputation right leg.  Skin  normotropic skin with no porokeratosis noted.  No signs of infections or ulcers noted.     Onychomycosis  Pain in left toes  Consent was obtained for treatment procedures.   Mechanical debridement of nails 1,4,5 left foot.performed with a nail nipper.  Filed with dremel without incident.    Return office visit    3 months                  Told patient to return for periodic foot care and evaluation due to potential at risk complications.   Gardiner Barefoot DPM

## 2020-08-31 ENCOUNTER — Other Ambulatory Visit: Payer: Self-pay | Admitting: Family Medicine

## 2020-08-31 ENCOUNTER — Other Ambulatory Visit (HOSPITAL_COMMUNITY): Payer: Self-pay

## 2020-08-31 NOTE — Telephone Encounter (Signed)
   Notes to clinic: medication filled by a different provider  Review continued use and refill   Requested Prescriptions  Pending Prescriptions Disp Refills   amiodarone (PACERONE) 200 MG tablet 90 tablet 2    Sig: TAKE 1 TABLET (200 MG TOTAL) BY MOUTH DAILY.      There is no refill protocol information for this order

## 2020-09-02 ENCOUNTER — Other Ambulatory Visit (HOSPITAL_COMMUNITY): Payer: Self-pay

## 2020-09-02 ENCOUNTER — Other Ambulatory Visit: Payer: Self-pay | Admitting: Physician Assistant

## 2020-09-06 ENCOUNTER — Other Ambulatory Visit (HOSPITAL_COMMUNITY): Payer: Self-pay

## 2020-09-07 ENCOUNTER — Other Ambulatory Visit (HOSPITAL_COMMUNITY): Payer: Self-pay

## 2020-09-07 ENCOUNTER — Telehealth: Payer: Self-pay | Admitting: Cardiovascular Disease

## 2020-09-07 MED ORDER — AMIODARONE HCL 200 MG PO TABS
ORAL_TABLET | Freq: Every day | ORAL | 2 refills | Status: AC
  Filled 2020-09-07: qty 90, 90d supply, fill #0

## 2020-09-07 NOTE — Telephone Encounter (Signed)
Pt's medication was sent to pt's pharmacy as requested. Confirmation received.  °

## 2020-09-07 NOTE — Telephone Encounter (Signed)
*  STAT* If patient is at the pharmacy, call can be transferred to refill team.   1. Which medications need to be refilled? (please list name of each medication and dose if known) Amiodarone  2. Which pharmacy/location (including street and city if local pharmacy) is medication to be sent to? [facility]  3. Do they need a 30 day or 90 day supply? 60 days and refills

## 2020-09-27 ENCOUNTER — Other Ambulatory Visit (HOSPITAL_COMMUNITY): Payer: Self-pay

## 2020-09-27 ENCOUNTER — Telehealth: Payer: Self-pay | Admitting: Cardiovascular Disease

## 2020-09-27 DIAGNOSIS — I2510 Atherosclerotic heart disease of native coronary artery without angina pectoris: Secondary | ICD-10-CM

## 2020-09-27 DIAGNOSIS — I5022 Chronic systolic (congestive) heart failure: Secondary | ICD-10-CM

## 2020-09-27 DIAGNOSIS — Z79899 Other long term (current) drug therapy: Secondary | ICD-10-CM

## 2020-09-27 MED ORDER — HYDRALAZINE HCL 10 MG PO TABS
ORAL_TABLET | ORAL | 3 refills | Status: DC
  Filled 2020-09-27: qty 60, 30d supply, fill #0

## 2020-09-27 MED ORDER — HYDRALAZINE HCL 10 MG PO TABS
ORAL_TABLET | ORAL | 3 refills | Status: DC
  Filled 2020-09-27: qty 60, fill #0

## 2020-09-27 NOTE — Telephone Encounter (Addendum)
Spoke with the pt and he reports that he has been having consistent elevated BP readings... he had h/o hypotension some time ago after dialysis but he has not had that for quite some time... he does not have a list of readings but it is usually an elevated systolic 269-485 but diastolic stays around 46-27. HR staying in the 70's.   (Friday it was 191/74 at Dialysis when he checked in,  Friday later 185/70,  and when checked out of Dialysis it was 129/74- Yesterday at Dialysis it was 178/77, 202/74, checked out 176/66)   Pt has headache and some mild dizziness when his BP is high.   Per his 04/2020 office visit note his BP was being managed by Nephrology but he says he has not seen them for almost 2 years... there is a physician that sees him on passing at dialysis but has not addressed his BP issues.   His PCP asked him to call Cardiology.   Pt has not had a good diet but recently has tried to start making more nutritional changes.   He has not had a low NA diet until recently. No edema and no chest pain.   I have asked the pt to keep a log of his BP. Dr. Angelena Form is out of the office this week. I will forward to the covering MD for review and recommendations.    LVEF=45-50% by echo August 2021. Volume control by dialysis. Will continue his Coreg. No Ace-inh or ARB due to hypotension on dialysis.

## 2020-09-27 NOTE — Telephone Encounter (Signed)
Marcelle Overlie D, RPH-CPP 22 minutes ago (1:46 PM)     I would start hydralazine 10mg  BID. Hold dose prior to HD. Have him follow up with [place] in 2 weeks.      Note   Pt advised and will keep a log of his BP and will see the Pharm D for BP follow up in 2 weeks.

## 2020-09-27 NOTE — Telephone Encounter (Signed)
Pt c/o BP issue: STAT if pt c/o blurred vision, one-sided weakness or slurred speech  1. What are your last 5 BP readings? Friday it was 191/74 at Dialysis when he checked in,  Friday later 185/70,  and when checked out of Dialysis it was 129/74- Yesterday at Dialysis it was 178/77, 202/74, checked out 176/66  2. Are you having any other symptoms (ex. Dizziness, headache, blurred vision, passed out)? Weakness, very slight headache at times  3. What is your BP issue? Blood pressure is running high and would like to be seen

## 2020-09-27 NOTE — Telephone Encounter (Signed)
Pt has been notified.

## 2020-09-27 NOTE — Telephone Encounter (Signed)
I would start hydralazine 10mg  BID. Hold dose prior to HD. Have him follow up with [place] in 2 weeks.

## 2020-09-28 ENCOUNTER — Other Ambulatory Visit (HOSPITAL_COMMUNITY): Payer: Self-pay

## 2020-09-28 MED ORDER — AMLODIPINE BESYLATE 5 MG PO TABS
5.0000 mg | ORAL_TABLET | Freq: Every evening | ORAL | 3 refills | Status: DC
  Filled 2020-09-28: qty 90, 90d supply, fill #0

## 2020-09-29 ENCOUNTER — Telehealth: Payer: Self-pay

## 2020-09-29 NOTE — Telephone Encounter (Signed)
   [facility] Pre-operative Risk Assessment    Patient Name: Jose Garrett  DOB: [birth year] MRN: [id number]  HEARTCARE STAFF:  - IMPORTANT!!!!!! Under Visit Info/Reason for Call, type in Other and utilize the format Clearance MM/DD/YY or Clearance TBD. Do not use dashes or single digits. - Please review there is not already an duplicate clearance open for this procedure. - If request is for dental extraction, please clarify the # of teeth to be extracted. - If the patient is currently at the dentist's office, call Pre-Op Callback Staff (MA/nurse) to input urgent request.  - If the patient is not currently in the dentist office, please route to the Pre-Op pool.  Request for surgical clearance:  What type of surgery is being performed? EXCISION OF LT WRIST DORSAL VOLAR MASSES  When is this surgery scheduled? 11-04-2020  What type of clearance is required (medical clearance vs. Pharmacy clearance to hold med vs. Both)? BOTH  Are there any medications that need to be held prior to surgery and how long? NONE LISTED-PT TAKES PLAVIX  Practice name and name of physician performing surgery? [facility] ATTN:RHONDA  What is the office phone number? [phone]   7.   What is the office fax number? [phone]  8.   Anesthesia type (None, local, MAC, general) ? GENERAL   Waylan Rocher 09/29/2020, 3:58 PM  _________________________________________________________________   (provider comments below)

## 2020-10-03 NOTE — Telephone Encounter (Signed)
Patient recently had elevated blood pressure and was placed on hydralazine.  He has a follow-up appointment 10/13/2020.  Blood pressure is well controlled at that time he will need a call back.

## 2020-10-12 NOTE — Telephone Encounter (Signed)
   Name: Jose Garrett  DOB: [birth year]  MRN: [id number]   Primary Cardiologist: Lauree Chandler, MD  Chart reviewed as part of pre-operative protocol coverage. Patient was contacted 10/12/2020 in reference to pre-operative risk assessment for pending surgery as outlined below.  Jose Garrett was last seen on 04/28/2020 by Dr. Angelena Form.  Since that day, Jose Garrett has not had any chest discomfort or worsening shortness of breath, he does mention that his fatigue may be slightly worse.  Therefore, based on ACC/AHA guidelines, the patient would be at acceptable risk for the planned procedure without further cardiovascular testing.   Dr. Angelena Form, please review to see if he can come off of Plavix for 5 days prior to the hand surgery.  Please forward her response to [facility] [place], [place] 10/12/2020, 6:25 PM

## 2020-10-13 ENCOUNTER — Other Ambulatory Visit: Payer: Self-pay

## 2020-10-13 ENCOUNTER — Other Ambulatory Visit (HOSPITAL_COMMUNITY): Payer: Self-pay

## 2020-10-13 ENCOUNTER — Ambulatory Visit (INDEPENDENT_AMBULATORY_CARE_PROVIDER_SITE_OTHER): Payer: Medicare Other | Admitting: Pharmacist

## 2020-10-13 VITALS — BP 182/70 | HR 75

## 2020-10-13 DIAGNOSIS — I10 Essential (primary) hypertension: Secondary | ICD-10-CM

## 2020-10-13 MED ORDER — OXYCODONE HCL 10 MG PO TABS
ORAL_TABLET | ORAL | 0 refills | Status: DC
  Filled 2020-10-13: qty 60, 15d supply, fill #0

## 2020-10-13 NOTE — Progress Notes (Signed)
Patient ID: Jose Garrett                 DOB: [birth year]                      MRN: [id number]     HPI: Jose Garrett is a 70 y.o. male referred by Dr. Angelena Form to HTN clinic. PMH is significant for ESRD on HD, AL amyloid (kidney involvement), aortic stenosis, HTN, HLD, GERD, anemia, chronic combined CHF and CAD. He had TAVR in August 2020.  Patient called the office on 7/19 reporting elevated blood pressures. He has h/o hypotension some time ago after dialysis but he has not had that for quite some time per report from RN. BP were elevated 517-616 systolic. HR 70's. He was started on hydralazine 10mg  BID and was advised to hold hydralazine prior to HD.  Patient presents today to the HTN clinic. He states that the kidney doctor at HD didn't like the hydralazine choice and put him on amlodipine 5mg  daily instead. States his BP is much better now. Usually BP before HD is 073'X systolic and afterwards runs 120-140's. Today he took his amlodipine late, just shortly before apt and he has been having back spasms. He denies dizziness, lightheadedness or headache.  Current HTN meds: amlodipine 5mg  daily BP goal: defer to nephrology for goal due to hx of low BP after HD  Family History: The patient's family history includes Diabetes in his father, mother, and sister; Heart disease in his mother; Hypertension in his father and mother; Skin cancer in his mother.   Social History: The patient  reports that he quit smoking about 9 years ago. His smoking use included cigarettes. He has a 80.00 pack-year smoking history. He has never used smokeless tobacco. He reports that he does not drink alcohol and does not use drugs.  Diet: not addressed  Exercise: not addressed  Home BP readings: 150's before HD, 120-140 after HD  Wt Readings from Last 3 Encounters:  07/28/20 185 lb 11.2 oz (84.2 kg)  06/28/20 188 lb (85.3 kg)  06/02/20 176 lb 5.9 oz (80 kg)   BP Readings from Last 3 Encounters:  07/28/20 (!)  186/69  06/28/20 140/61  06/02/20 (!) 164/80   Pulse Readings from Last 3 Encounters:  07/28/20 72  06/28/20 74  05/03/20 77    Renal function: CrCl cannot be calculated (Patient's most recent lab result is older than the maximum 21 days allowed.).  Past Medical History:  Diagnosis Date   Amyloidosis ([place])    Anemia of chronic disease    Anxiety    CAD (coronary artery disease)    a. cardiac cath on 11/25/17 with occlusion of the RCA which could have been his event but the RCA could not be engaged. The RCA filled distally from left to right collaterals. There were no severe stenoses in the left coronary system. Medical therapy recommended, started on plavix.   Chronic combined systolic and diastolic CHF (congestive heart failure) ([place])    a. EF dropped to 35-40% with grade 2 DD by echo 11/2017.   DJD (degenerative joint disease), lumbar    ESRD (end stage renal disease) ([place])    ESRD- HEMO MWF- [address]   GERD (gastroesophageal reflux disease)    Hyperlipidemia    Hypertension    Myocardial infarction [facility])    Neuropathy    Peripheral vascular disease (HCC)    Pneumonia    Restless legs  S/P TAVR (transcatheter aortic valve replacement) 10/21/2018   26 mm Edwards Sapien 3 Ultra transcatheter heart valve placed via percutaneous right transfemoral approach    Severe aortic stenosis    severe    Current Outpatient Medications on File Prior to Visit  Medication Sig Dispense Refill   acetaminophen (TYLENOL) 325 MG tablet Take 1-2 tablets (325-650 mg total) by mouth every 4 (four) hours as needed for mild pain (or temp >/= 101 F).     amiodarone (PACERONE) 200 MG tablet TAKE 1 TABLET (200 MG TOTAL) BY MOUTH DAILY. 90 tablet 2   amLODipine (NORVASC) 5 MG tablet Take 1 tablet (5 mg total) by mouth at bedtime. 90 tablet 3   atorvastatin (LIPITOR) 80 MG tablet TAKE 1 TABLET BY MOUTH DAILY WITH SUPPER 90 tablet 3   clopidogrel (PLAVIX) 75 MG tablet TAKE 1 TABLET BY MOUTH DAILY  WITH SUPPER 90 tablet 3   cyclobenzaprine (FLEXERIL) 10 MG tablet TAKE 1 TABLET BY MOUTH 3 TIMES A DAY AS NEEDED. 30 tablet 0   Darbepoetin Alfa (ARANESP) 100 MCG/0.5ML SOSY injection Inject 0.5 mLs (100 mcg total) into the vein every Friday with hemodialysis. 4.2 mL    docusate sodium (COLACE) 100 MG capsule Take 1 capsule (100 mg total) by mouth 2 (two) times daily. 10 capsule 0   hydrALAZINE (APRESOLINE) 10 MG tablet Take one tablet by mouth twice daily. Hold the morning of your dialysis days. 60 tablet 3   iron sucrose in sodium chloride 0.9 % 100 mL Iron Sucrose (Venofer)     lanthanum (FOSRENOL) 1000 MG chewable tablet Chew 2,000 mg by mouth 3 (three) times daily.     lidocaine (LIDODERM) 5 % APPLY 1 PATCH TOPICALLY ONCE A DAY. MAY WEAR UP TO 12 HOURS. 30 patch 2   LORazepam (ATIVAN) 1 MG tablet Take 1 tablet (1 mg total) by mouth 2 (two) times daily as needed for anxiety. 20 tablet 0   Melatonin 3 MG TABS Take 0.5 tablets (1.5 mg total) by mouth at bedtime. 30 tablet 0   Methoxy PEG-Epoetin Beta (MIRCERA IJ) Mircera     multivitamin (RENA-VIT) TABS tablet Take 1 tablet by mouth at bedtime. 30 tablet 0   ondansetron (ZOFRAN) 4 MG tablet TAKE 1 TABLET BY MOUTH TWICE A DAY AS NEEDED 60 tablet 0   oxyCODONE (OXY IR/ROXICODONE) 5 MG immediate release tablet Take 1-2 tablets (5-10 mg total) by mouth every 4 (four) hours as needed for moderate pain. 30 tablet 0   Oxycodone HCl 10 MG TABS TAKE 1 TABLET BY MOUTH 4 TIMES A DAY AS NEEDED. 60 tablet 0   pantoprazole (PROTONIX) 40 MG tablet Take 1 tablet (40 mg total) by mouth daily. 30 tablet 0   pantoprazole (PROTONIX) 40 MG tablet Take 1 tablet (40 mg) by mouth once daily 90 tablet 3   tiZANidine (ZANAFLEX) 2 MG tablet TAKE 1 TABLET BY MOUTH 3 TIMES DAILY AS NEEDED FOR MUSCLE SPASMS 90 tablet 0   Vancomycin (VANCOCIN) 750-5 MG/150ML-% SOLN Inject 150 mLs (750 mg total) into the vein every Monday, Wednesday, and Friday with hemodialysis. 4000 mL 0    vitamin B-12 (CYANOCOBALAMIN) 1000 MCG tablet Take 1 tablet (1,000 mcg total) by mouth every evening. 30 tablet 0   No current facility-administered medications on file prior to visit.    No Known Allergies   Assessment/Plan:  1. Hypertension - Blood pressure is high today in clinic. Patient took his amlodipine late and is in pain from back  spasms. Reports at HD it is about 150/70 when he gets there. Contributes a little higher reading due to walking down the hallway. After HD BP is 120-140/70's. Due to his hx of significantly low BP after HD, patient's blood pressure should not be aggressively lowered. I will defer to nephrology to manage BP. Continue amlodipine 5mg  daily.  Thank you,  Ramond Dial, Pharm.D, BCPS, CPP [place]  [address] [place], [place] [address]  Phone: [phone]; Fax: [phone]

## 2020-10-13 NOTE — Patient Instructions (Signed)
Continue amlodipine 5mg  daily Follow up with nephrology about your blood pressure

## 2020-10-16 ENCOUNTER — Emergency Department (HOSPITAL_COMMUNITY): Payer: Medicare Other

## 2020-10-16 ENCOUNTER — Inpatient Hospital Stay (HOSPITAL_COMMUNITY)
Admission: EM | Admit: 2020-10-16 | Discharge: 2020-10-18 | DRG: 291 | Disposition: A | Discharge status: Home / Self Care | Payer: Medicare Other | Attending: Family Medicine | Admitting: Family Medicine

## 2020-10-16 DIAGNOSIS — I5043 Acute on chronic combined systolic (congestive) and diastolic (congestive) heart failure: Secondary | ICD-10-CM | POA: Diagnosis present

## 2020-10-16 DIAGNOSIS — Z20822 Contact with and (suspected) exposure to covid-19: Secondary | ICD-10-CM | POA: Diagnosis present

## 2020-10-16 DIAGNOSIS — I739 Peripheral vascular disease, unspecified: Secondary | ICD-10-CM | POA: Diagnosis present

## 2020-10-16 DIAGNOSIS — I2510 Atherosclerotic heart disease of native coronary artery without angina pectoris: Secondary | ICD-10-CM | POA: Diagnosis present

## 2020-10-16 DIAGNOSIS — I132 Hypertensive heart and chronic kidney disease with heart failure and with stage 5 chronic kidney disease, or end stage renal disease: Principal | ICD-10-CM | POA: Diagnosis present

## 2020-10-16 DIAGNOSIS — Z09 Encounter for follow-up examination after completed treatment for conditions other than malignant neoplasm: Secondary | ICD-10-CM

## 2020-10-16 DIAGNOSIS — Z7902 Long term (current) use of antithrombotics/antiplatelets: Secondary | ICD-10-CM

## 2020-10-16 DIAGNOSIS — E8770 Fluid overload, unspecified: Secondary | ICD-10-CM | POA: Diagnosis present

## 2020-10-16 DIAGNOSIS — Z9842 Cataract extraction status, left eye: Secondary | ICD-10-CM | POA: Diagnosis not present

## 2020-10-16 DIAGNOSIS — Z992 Dependence on renal dialysis: Secondary | ICD-10-CM | POA: Diagnosis not present

## 2020-10-16 DIAGNOSIS — Z833 Family history of diabetes mellitus: Secondary | ICD-10-CM

## 2020-10-16 DIAGNOSIS — Z808 Family history of malignant neoplasm of other organs or systems: Secondary | ICD-10-CM

## 2020-10-16 DIAGNOSIS — Z89511 Acquired absence of right leg below knee: Secondary | ICD-10-CM | POA: Diagnosis not present

## 2020-10-16 DIAGNOSIS — I252 Old myocardial infarction: Secondary | ICD-10-CM

## 2020-10-16 DIAGNOSIS — D631 Anemia in chronic kidney disease: Secondary | ICD-10-CM | POA: Diagnosis present

## 2020-10-16 DIAGNOSIS — J811 Chronic pulmonary edema: Secondary | ICD-10-CM

## 2020-10-16 DIAGNOSIS — Z79899 Other long term (current) drug therapy: Secondary | ICD-10-CM

## 2020-10-16 DIAGNOSIS — F419 Anxiety disorder, unspecified: Secondary | ICD-10-CM | POA: Diagnosis present

## 2020-10-16 DIAGNOSIS — Z9841 Cataract extraction status, right eye: Secondary | ICD-10-CM

## 2020-10-16 DIAGNOSIS — Z89422 Acquired absence of other left toe(s): Secondary | ICD-10-CM | POA: Diagnosis not present

## 2020-10-16 DIAGNOSIS — Z961 Presence of intraocular lens: Secondary | ICD-10-CM | POA: Diagnosis present

## 2020-10-16 DIAGNOSIS — Z87891 Personal history of nicotine dependence: Secondary | ICD-10-CM

## 2020-10-16 DIAGNOSIS — E8889 Other specified metabolic disorders: Secondary | ICD-10-CM | POA: Diagnosis present

## 2020-10-16 DIAGNOSIS — I248 Other forms of acute ischemic heart disease: Secondary | ICD-10-CM | POA: Diagnosis present

## 2020-10-16 DIAGNOSIS — N186 End stage renal disease: Secondary | ICD-10-CM | POA: Diagnosis present

## 2020-10-16 DIAGNOSIS — K219 Gastro-esophageal reflux disease without esophagitis: Secondary | ICD-10-CM | POA: Diagnosis present

## 2020-10-16 DIAGNOSIS — J9601 Acute respiratory failure with hypoxia: Secondary | ICD-10-CM | POA: Diagnosis present

## 2020-10-16 DIAGNOSIS — G2581 Restless legs syndrome: Secondary | ICD-10-CM | POA: Diagnosis present

## 2020-10-16 DIAGNOSIS — J810 Acute pulmonary edema: Secondary | ICD-10-CM

## 2020-10-16 DIAGNOSIS — I160 Hypertensive urgency: Secondary | ICD-10-CM | POA: Diagnosis present

## 2020-10-16 DIAGNOSIS — E875 Hyperkalemia: Secondary | ICD-10-CM | POA: Diagnosis present

## 2020-10-16 DIAGNOSIS — G629 Polyneuropathy, unspecified: Secondary | ICD-10-CM | POA: Diagnosis present

## 2020-10-16 DIAGNOSIS — Z9049 Acquired absence of other specified parts of digestive tract: Secondary | ICD-10-CM | POA: Diagnosis not present

## 2020-10-16 DIAGNOSIS — I452 Bifascicular block: Secondary | ICD-10-CM | POA: Diagnosis present

## 2020-10-16 DIAGNOSIS — Z8249 Family history of ischemic heart disease and other diseases of the circulatory system: Secondary | ICD-10-CM

## 2020-10-16 DIAGNOSIS — E785 Hyperlipidemia, unspecified: Secondary | ICD-10-CM | POA: Diagnosis present

## 2020-10-16 DIAGNOSIS — Z953 Presence of xenogenic heart valve: Secondary | ICD-10-CM | POA: Diagnosis not present

## 2020-10-16 LAB — I-STAT VENOUS BLOOD GAS, ED
Acid-Base Excess: 4 mmol/L — ABNORMAL HIGH (ref 0.0–2.0)
Bicarbonate: 30.1 mmol/L — ABNORMAL HIGH (ref 20.0–28.0)
Calcium, Ion: 1.13 mmol/L — ABNORMAL LOW (ref 1.15–1.40)
HCT: 34 % — ABNORMAL LOW (ref 39.0–52.0)
Hemoglobin: 11.6 g/dL — ABNORMAL LOW (ref 13.0–17.0)
O2 Saturation: 86 %
Potassium: 4.2 mmol/L (ref 3.5–5.1)
Sodium: 139 mmol/L (ref 135–145)
TCO2: 32 mmol/L (ref 22–32)
pCO2, Ven: 48.6 mmHg (ref 44.0–60.0)
pH, Ven: 7.399 (ref 7.250–7.430)
pO2, Ven: 52 mmHg — ABNORMAL HIGH (ref 32.0–45.0)

## 2020-10-16 LAB — COMPREHENSIVE METABOLIC PANEL
ALT: 13 U/L (ref 0–44)
AST: 17 U/L (ref 15–41)
Albumin: 3.8 g/dL (ref 3.5–5.0)
Alkaline Phosphatase: 57 U/L (ref 38–126)
Anion gap: 19 — ABNORMAL HIGH (ref 5–15)
BUN: 45 mg/dL — ABNORMAL HIGH (ref 8–23)
CO2: 26 mmol/L (ref 22–32)
Calcium: 9.7 mg/dL (ref 8.9–10.3)
Chloride: 96 mmol/L — ABNORMAL LOW (ref 98–111)
Creatinine, Ser: 10.56 mg/dL — ABNORMAL HIGH (ref 0.61–1.24)
GFR, Estimated: 5 mL/min — ABNORMAL LOW (ref >60)
Glucose, Bld: 123 mg/dL — ABNORMAL HIGH (ref 70–99)
Potassium: 4.3 mmol/L (ref 3.5–5.1)
Sodium: 141 mmol/L (ref 135–145)
Total Bilirubin: 0.9 mg/dL (ref 0.3–1.2)
Total Protein: 7.1 g/dL (ref 6.5–8.1)

## 2020-10-16 LAB — I-STAT CHEM 8, ED
BUN: 40 mg/dL — ABNORMAL HIGH (ref 8–23)
Calcium, Ion: 1.05 mmol/L — ABNORMAL LOW (ref 1.15–1.40)
Chloride: 99 mmol/L (ref 98–111)
Creatinine, Ser: 10.7 mg/dL — ABNORMAL HIGH (ref 0.61–1.24)
Glucose, Bld: 120 mg/dL — ABNORMAL HIGH (ref 70–99)
HCT: 32 % — ABNORMAL LOW (ref 39.0–52.0)
Hemoglobin: 10.9 g/dL — ABNORMAL LOW (ref 13.0–17.0)
Potassium: 4.2 mmol/L (ref 3.5–5.1)
Sodium: 138 mmol/L (ref 135–145)
TCO2: 27 mmol/L (ref 22–32)

## 2020-10-16 LAB — CBC WITH DIFFERENTIAL/PLATELET
Abs Immature Granulocytes: 0.08 10*3/uL — ABNORMAL HIGH (ref 0.00–0.07)
Basophils Absolute: 0 10*3/uL (ref 0.0–0.1)
Basophils Relative: 0 %
Eosinophils Absolute: 0.1 10*3/uL (ref 0.0–0.5)
Eosinophils Relative: 1 %
HCT: 33.5 % — ABNORMAL LOW (ref 39.0–52.0)
Hemoglobin: 10.8 g/dL — ABNORMAL LOW (ref 13.0–17.0)
Immature Granulocytes: 1 %
Lymphocytes Relative: 13 %
Lymphs Abs: 1.2 10*3/uL (ref 0.7–4.0)
MCH: 31.4 pg (ref 26.0–34.0)
MCHC: 32.2 g/dL (ref 30.0–36.0)
MCV: 97.4 fL (ref 80.0–100.0)
Monocytes Absolute: 0.4 10*3/uL (ref 0.1–1.0)
Monocytes Relative: 4 %
Neutro Abs: 7.4 10*3/uL (ref 1.7–7.7)
Neutrophils Relative %: 81 %
Platelets: 149 10*3/uL — ABNORMAL LOW (ref 150–400)
RBC: 3.44 MIL/uL — ABNORMAL LOW (ref 4.22–5.81)
RDW: 15.9 % — ABNORMAL HIGH (ref 11.5–15.5)
WBC: 9.2 10*3/uL (ref 4.0–10.5)
nRBC: 0 % (ref 0.0–0.2)

## 2020-10-16 LAB — BRAIN NATRIURETIC PEPTIDE: B Natriuretic Peptide: 1084.5 pg/mL — ABNORMAL HIGH (ref 0.0–100.0)

## 2020-10-16 LAB — RESP PANEL BY RT-PCR (FLU A&B, COVID) ARPGX2
Influenza A by PCR: NEGATIVE
Influenza B by PCR: NEGATIVE
SARS Coronavirus 2 by RT PCR: NEGATIVE

## 2020-10-16 LAB — TROPONIN I (HIGH SENSITIVITY)
Troponin I (High Sensitivity): 73 ng/L — ABNORMAL HIGH (ref <18)
Troponin I (High Sensitivity): 74 ng/L — ABNORMAL HIGH (ref <18)

## 2020-10-16 MED ORDER — HYDRALAZINE HCL 20 MG/ML IJ SOLN
5.0000 mg | Freq: Four times a day (QID) | INTRAMUSCULAR | Status: DC | PRN
  Administered 2020-10-17: 5 mg via INTRAVENOUS
  Filled 2020-10-16: qty 1

## 2020-10-16 MED ORDER — CHLORHEXIDINE GLUCONATE CLOTH 2 % EX PADS
6.0000 | MEDICATED_PAD | Freq: Every day | CUTANEOUS | Status: DC
  Administered 2020-10-18: 6 via TOPICAL

## 2020-10-16 MED ORDER — ALBUTEROL SULFATE HFA 108 (90 BASE) MCG/ACT IN AERS
2.0000 | INHALATION_SPRAY | Freq: Once | RESPIRATORY_TRACT | Status: AC
  Administered 2020-10-16: 2 via RESPIRATORY_TRACT
  Filled 2020-10-16: qty 6.7

## 2020-10-16 MED ORDER — CALCITRIOL 0.5 MCG PO CAPS
1.5000 ug | ORAL_CAPSULE | ORAL | Status: DC
  Administered 2020-10-17: 1.5 ug via ORAL
  Filled 2020-10-16 (×2): qty 3

## 2020-10-16 MED ORDER — METHYLPREDNISOLONE SODIUM SUCC 125 MG IJ SOLR
125.0000 mg | Freq: Once | INTRAMUSCULAR | Status: AC
  Administered 2020-10-16: 125 mg via INTRAVENOUS
  Filled 2020-10-16: qty 2

## 2020-10-16 NOTE — H&P (Signed)
History and Physical    Jose Garrett EVO:[id number] DOB: 18-May-1950 DOA: 10/16/2020  PCP: [name], NP Patient coming from: Home  Chief Complaint: Shortness of breath  HPI: Jose Garrett is a 70 y.o. male with medical history significant of ESRD on HD MWF, chronic combined CHF, severe aortic stenosis status post TAVR, history of NSVT post TAVR on amiodarone, CAD, PVD status post right BKA, anemia, hypertension, hyperlipidemia presenting to the ED for evaluation of respiratory distress.  Satting 68% on room air with EMS and had bilateral coarse crackles.  Placed on nonrebreather and sats improved to 100%.  Hypertensive with blood pressure 220/114.  In the ED, noted to be wheezing as well and was given albuterol and IV Solu-Medrol 125 mg.  VBG on nonrebreather showing pH 7.39.  WBC 9.2, hemoglobin 10.8 (stable), platelet count 149.  Sodium 141, potassium 4.3, chloride 96, bicarb 26, BUN 45, creatinine 10.5, glucose 123.  BNP 1084.  Initial high-sensitivity troponin 73, repeat pending.  EKG showing diffuse ST depressions, more pronounced compared to prior tracing.  COVID and influenza PCR negative.  Chest x-ray showing pulmonary edema. Patient was seen by Dr. [name] from nephrology and plan for urgent dialysis.  Patient states he is feeling short of breath for the past few days.  Also endorsing orthopnea and leg swelling.  States he goes for dialysis every Monday, Wednesday, and Friday but has not been able to sit through full sessions due to back spasms.  Denies fevers or chest pain.  He was not able to take his blood pressure medications today due to shortness of breath but normally takes them regularly.  Review of Systems:  All systems reviewed and apart from history of presenting illness, are negative.  Past Medical History:  Diagnosis Date   Amyloidosis ([place])    Anemia of chronic disease    Anxiety    CAD (coronary artery disease)    a. cardiac cath on 11/25/17 with occlusion of the  RCA which could have been his event but the RCA could not be engaged. The RCA filled distally from left to right collaterals. There were no severe stenoses in the left coronary system. Medical therapy recommended, started on plavix.   Chronic combined systolic and diastolic CHF (congestive heart failure) ([place])    a. EF dropped to 35-40% with grade 2 DD by echo 11/2017.   DJD (degenerative joint disease), lumbar    ESRD (end stage renal disease) ([place])    ESRD- HEMO MWF- [place]   GERD (gastroesophageal reflux disease)    Hyperlipidemia    Hypertension    Myocardial infarction [facility])    Neuropathy    Peripheral vascular disease (HCC)    Pneumonia    Restless legs    S/P TAVR (transcatheter aortic valve replacement) 10/21/2018   26 mm Edwards Sapien 3 Ultra transcatheter heart valve placed via percutaneous right transfemoral approach    Severe aortic stenosis    severe    Past Surgical History:  Procedure Laterality Date   ABDOMINAL AORTOGRAM W/LOWER EXTREMITY Bilateral 07/16/2018   Procedure: ABDOMINAL AORTOGRAM W/LOWER EXTREMITY;  Surgeon: Wellington Hampshire, MD;  Location: [place] CV LAB;  Service: Cardiovascular;  Laterality: Bilateral;   ABDOMINAL AORTOGRAM W/LOWER EXTREMITY N/A 04/09/2019   Procedure: ABDOMINAL AORTOGRAM W/LOWER EXTREMITY - Right Lower;  Surgeon: Waynetta Sandy, MD;  Location: [place] CV LAB;  Service: Cardiovascular;  Laterality: N/A;   AMPUTATION Left 08/19/2018   Procedure: AMPUTATION LEFT TOES SECOND AND THIRD;  Surgeon: Waynetta Sandy, MD;  Location: [place];  Service: Vascular;  Laterality: Left;   AMPUTATION Right 05/07/2019   Procedure: AMPUTATION BELOW KNEE;  Surgeon: Serafina Mitchell, MD;  Location: [place];  Service: Vascular;  Laterality: Right;   AV FISTULA PLACEMENT Right 12/27/2015   Procedure: Right Arm ARTERIOVENOUS (AV) FISTULA CREATION;  Surgeon: Angelia Mould, MD;  Location: [place];  Service: Vascular;  Laterality:  Right;   CATARACT EXTRACTION W/ INTRAOCULAR LENS  IMPLANT, BILATERAL     COLONOSCOPY     ERCP W/ SPHICTEROTOMY     FEMORAL-POPLITEAL BYPASS GRAFT Left 07/22/2018   Procedure: left FEMORAL TO BELOW KNEE POPLITEAL ARTERY BYPASS GREAFT using 71mm removable ring propaten gore graft;  Surgeon: Waynetta Sandy, MD;  Location: [place];  Service: Vascular;  Laterality: Left;   FEMORAL-POPLITEAL BYPASS GRAFT Right 04/10/2019   Procedure: BYPASS GRAFT RIGHT FEMORAL-POPLITEAL ARTERY;  Surgeon: Rosetta Posner, MD;  Location: [place];  Service: Vascular;  Laterality: Right;   I & D EXTREMITY Right 05/05/2019   Procedure: Revision of  RIGHT  FOOT Amputation;  Surgeon: Waynetta Sandy, MD;  Location: [place];  Service: Vascular;  Laterality: Right;   INTRAOPERATIVE TRANSTHORACIC ECHOCARDIOGRAM N/A 10/21/2018   Procedure: Intraoperative Transthoracic Echocardiogram;  Surgeon: Sherren Mocha, MD;  Location: [place];  Service: Open Heart Surgery;  Laterality: N/A;   IR GENERIC HISTORICAL  01/27/2016   IR FLUORO GUIDE CV LINE RIGHT 01/27/2016 Arne Cleveland, MD [facility]   IR GENERIC HISTORICAL  01/27/2016   IR US GUIDE VASC ACCESS RIGHT 01/27/2016 Arne Cleveland, MD [facility]   LAPAROSCOPIC CHOLECYSTECTOMY  03/2011   LEFT HEART CATH AND CORONARY ANGIOGRAPHY N/A 11/25/2017   Procedure: LEFT HEART CATH AND CORONARY ANGIOGRAPHY;  Surgeon: Martinique, Peter M, MD;  Location: [place] CV [facility];  Service: Cardiovascular;  Laterality: N/A;   LEFT HEART CATH AND CORONARY ANGIOGRAPHY N/A 07/24/2018   Procedure: LEFT HEART CATH AND CORONARY ANGIOGRAPHY;  Surgeon: Sherren Mocha, MD;  Location: [place] [facility];  Service: Cardiovascular;  Laterality: N/A;   RENAL BIOPSY, PERCUTANEOUS Right 09/08/2014   RIGHT/LEFT HEART CATH AND CORONARY ANGIOGRAPHY N/A 03/25/2018   Procedure: RIGHT/LEFT HEART CATH AND CORONARY ANGIOGRAPHY;  Surgeon: Martinique, Peter M, MD;  Location: [place] [facility];  Service: Cardiovascular;   Laterality: N/A;   TEE WITHOUT CARDIOVERSION N/A 05/14/2019   Procedure: TRANSESOPHAGEAL ECHOCARDIOGRAM (TEE);  Surgeon: Fay Records, MD;  Location: [facility] ENDOSCOPY;  Service: Cardiovascular;  Laterality: N/A;   TOE AMPUTATION Left 08/19/2018   2nd & 3rd toes   TONSILLECTOMY  ~ 1960   TRANSCATHETER AORTIC VALVE REPLACEMENT, TRANSFEMORAL N/A 10/21/2018   Procedure: TRANSCATHETER AORTIC VALVE REPLACEMENT, TRANSFEMORAL;  Surgeon: Sherren Mocha, MD;  Location: [place];  Service: Open Heart Surgery;  Laterality: N/A;   TRANSMETATARSAL AMPUTATION Right 04/29/2019   Procedure: RIGHT TRANSMETATARSAL AMPUTATION;  Surgeon: Waynetta Sandy, MD;  Location: [place];  Service: Vascular;  Laterality: Right;   ULTRASOUND GUIDANCE FOR VASCULAR ACCESS  03/25/2018   Procedure: Ultrasound Guidance For Vascular Access;  Surgeon: Martinique, Peter M, MD;  Location: [place] CV [facility];  Service: Cardiovascular;;     reports that he quit smoking about 9 years ago. His smoking use included cigarettes. He has a 80.00 pack-year smoking history. He has never used smokeless tobacco. He reports that he does not drink alcohol and does not use drugs.  No Known Allergies  Family History  Problem Relation Age of Onset  Hypertension Mother    Diabetes Mother    Heart disease Mother    Skin cancer Mother    Hypertension Father    Diabetes Father    Diabetes Sister     Prior to Admission medications   Medication Sig Start Date End Date Taking? Authorizing Provider  acetaminophen (TYLENOL) 325 MG tablet Take 1-2 tablets (325-650 mg total) by mouth every 4 (four) hours as needed for mild pain (or temp >/= 101 F). 05/27/19   Angiulli, Lavon Paganini, PA-C  amiodarone (PACERONE) 200 MG tablet TAKE 1 TABLET (200 MG TOTAL) BY MOUTH DAILY. 09/07/20 09/07/21  Burnell Blanks, MD  amLODipine (NORVASC) 5 MG tablet Take 1 tablet (5 mg total) by mouth at bedtime. 09/28/20     atorvastatin (LIPITOR) 80 MG tablet TAKE 1 TABLET BY MOUTH  DAILY WITH SUPPER 04/28/20 04/28/21  Burnell Blanks, MD  clopidogrel (PLAVIX) 75 MG tablet TAKE 1 TABLET BY MOUTH DAILY WITH SUPPER 04/28/20 04/28/21  Burnell Blanks, MD  Darbepoetin Alfa (ARANESP) 100 MCG/0.5ML SOSY injection Inject 0.5 mLs (100 mcg total) into the vein every Friday with hemodialysis. 05/29/19   Angiulli, Lavon Paganini, PA-C  docusate sodium (COLACE) 100 MG capsule Take 1 capsule (100 mg total) by mouth 2 (two) times daily. 05/27/19   Angiulli, Lavon Paganini, PA-C  iron sucrose in sodium chloride 0.9 % 100 mL Iron Sucrose (Venofer) 07/27/20 07/26/21  [provider]  lanthanum (FOSRENOL) 1000 MG chewable tablet Chew 2,000 mg by mouth 3 (three) times daily. 07/15/20   [provider]  LORazepam (ATIVAN) 1 MG tablet Take 1 tablet (1 mg total) by mouth 2 (two) times daily as needed for anxiety. 05/27/19   Angiulli, Lavon Paganini, PA-C  Methoxy PEG-Epoetin Beta (MIRCERA IJ) Mircera 06/08/20 06/07/21  [provider]  multivitamin (RENA-VIT) TABS tablet Take 1 tablet by mouth at bedtime. 08/12/18   Angiulli, Lavon Paganini, PA-C  ondansetron (ZOFRAN) 4 MG tablet TAKE 1 TABLET BY MOUTH TWICE A DAY AS NEEDED 06/28/20 06/28/21  Wellington Hampshire, MD  oxyCODONE (OXY IR/ROXICODONE) 5 MG immediate release tablet Take 1-2 tablets (5-10 mg total) by mouth every 4 (four) hours as needed for moderate pain. 05/27/19   Angiulli, Lavon Paganini, PA-C  Oxycodone HCl 10 MG TABS TAKE 1 TABLET BY MOUTH 4 TIMES A DAY AS NEEDED. 10/13/20     pantoprazole (PROTONIX) 40 MG tablet Take 1 tablet (40 mg total) by mouth daily. 05/28/19   Angiulli, Lavon Paganini, PA-C  vitamin B-12 (CYANOCOBALAMIN) 1000 MCG tablet Take 1 tablet (1,000 mcg total) by mouth every evening. 05/27/19   Cathlyn Parsons, PA-C    Physical Exam: Vitals:   10/16/20 2145 10/16/20 2148 10/16/20 2200 10/16/20 2209  BP: (!) 184/82  (!) 202/70   Pulse: 93  92   Resp: (!) 29  (!) 30   Temp:      TempSrc:      SpO2: (!) 88% (!) 89% (!) 88% 95%   Weight:        Physical Exam Constitutional:      General: He is not in acute distress. HENT:     Head: Normocephalic and atraumatic.  Eyes:     Extraocular Movements: Extraocular movements intact.     Conjunctiva/sclera: Conjunctivae normal.  Cardiovascular:     Rate and Rhythm: Normal rate and regular rhythm.     Pulses: Normal pulses.  Pulmonary:     Comments: Slightly tachypneic Diffuse crackles No wheezing Abdominal:  General: Bowel sounds are normal. There is no distension.     Palpations: Abdomen is soft.     Tenderness: There is no abdominal tenderness.  Musculoskeletal:     Cervical back: Normal range of motion and neck supple.     Comments: +3 pitting edema of left lower extremity Right BKA  Skin:    General: Skin is warm and dry.  Neurological:     General: No focal deficit present.     Mental Status: He is alert and oriented to person, place, and time.     Labs on Admission: I have personally reviewed following labs and imaging studies  CBC: Recent Labs  Lab 10/16/20 2016 10/16/20 2047  WBC 9.2  --   NEUTROABS 7.4  --   HGB 10.8* 11.6*  10.9*  HCT 33.5* 34.0*  32.0*  MCV 97.4  --   PLT 149*  --    Basic Metabolic Panel: Recent Labs  Lab 10/16/20 2016 10/16/20 2047  NA 141 139  138  K 4.3 4.2  4.2  CL 96* 99  CO2 26  --   GLUCOSE 123* 120*  BUN 45* 40*  CREATININE 10.56* 10.70*  CALCIUM 9.7  --    GFR: Estimated Creatinine Clearance: 6.8 mL/min (A) (by C-G formula based on SCr of 10.7 mg/dL (H)). Liver Function Tests: Recent Labs  Lab 10/16/20 2016  AST 17  ALT 13  ALKPHOS 57  BILITOT 0.9  PROT 7.1  ALBUMIN 3.8   No results for input(s): LIPASE, AMYLASE in the last 168 hours. No results for input(s): AMMONIA in the last 168 hours. Coagulation Profile: No results for input(s): INR, PROTIME in the last 168 hours. Cardiac Enzymes: No results for input(s): CKTOTAL, CKMB, CKMBINDEX, TROPONINI in the last 168 hours. BNP  (last 3 results) No results for input(s): PROBNP in the last 8760 hours. HbA1C: No results for input(s): HGBA1C in the last 72 hours. CBG: No results for input(s): GLUCAP in the last 168 hours. Lipid Profile: No results for input(s): CHOL, HDL, LDLCALC, TRIG, CHOLHDL, LDLDIRECT in the last 72 hours. Thyroid Function Tests: No results for input(s): TSH, T4TOTAL, FREET4, T3FREE, THYROIDAB in the last 72 hours. Anemia Panel: No results for input(s): VITAMINB12, FOLATE, FERRITIN, TIBC, IRON, RETICCTPCT in the last 72 hours. Urine analysis:    Component Value Date/Time   COLORURINE AMBER (A) 02/01/2016 1707   APPEARANCEUR CLOUDY (A) 02/01/2016 1707   LABSPEC 1.020 02/01/2016 1707   PHURINE 5.5 02/01/2016 1707   GLUCOSEU 100 (A) 02/01/2016 1707   HGBUR NEGATIVE 02/01/2016 1707   BILIRUBINUR SMALL (A) 02/01/2016 1707   KETONESUR NEGATIVE 02/01/2016 1707   PROTEINUR >300 (A) 02/01/2016 1707   NITRITE NEGATIVE 02/01/2016 1707   LEUKOCYTESUR TRACE (A) 02/01/2016 1707    Radiological Exams on Admission: DG Chest Port 1 View  Result Date: 10/16/2020 CLINICAL DATA:  Shortness of breath EXAM: PORTABLE CHEST 1 VIEW COMPARISON:  05/11/2019 FINDINGS: Cardiomegaly. Prior aortic valve repair. Diffuse interstitial prominence throughout the lungs, favor edema. Small bilateral effusions. No acute bony abnormality. IMPRESSION: Cardiomegaly. Diffuse interstitial prominence throughout the lungs, favor edema/CHF. Small effusions. Electronically Signed   By: Rolm Baptise M.D.   On: 10/16/2020 20:27    EKG: Independently reviewed.  Sinus or ectopic atrial tachycardia.  RBBB similar to prior tracing.  Diffuse ST depressions more pronounced compared to prior tracing.  Assessment/Plan Principal Problem:   Volume overload Active Problems:   ESRD (end stage renal disease) on dialysis ([place])  Acute on chronic combined systolic and diastolic CHF (congestive heart failure) (HCC)   Acute hypoxemic respiratory  failure (HCC)   Hypertensive urgency   Acute hypoxemic respiratory failure secondary to pulmonary edema in the setting of ESRD and acute on chronic combined CHF Patient states he has not been able to finish his dialysis sessions due to back spasms.  Appears volume overloaded on exam.  EF 45 to 50% on echo done in August 2021.  BNP significantly elevated.  Chest x-ray showing pulmonary edema.  He was satting 68% on room air with EMS, currently requiring 12 L supplemental oxygen via nonrebreather to maintain sats in the 90s.  Slightly tachypneic but appears comfortable on nonrebreather and do not think he needs BiPAP at this time.  Per ED physician, patient was wheezing initially and was given albuterol inhaler treatment and IV Solu-Medrol 125 mg.  Not wheezing at present.  Denies history of asthma or COPD.  He is not on any home inhalers. -Seen by nephrology and scheduled for urgent dialysis tonight.  Continue supplemental oxygen via nonrebreather.  Albuterol nebulizer PRN wheezing.  Repeat echocardiogram ordered.  Strict I&O, daily weights.  Dietary fluid restriction.  Hypertensive urgency Secondary to volume overload. -He is scheduled for urgent dialysis tonight.  IV hydralazine PRN. Resume home antihypertensives after pharmacy med rec is done.  Elevated troponin Likely due to demand ischemia.  Initial high-sensitivity troponin 73.  EKG showing diffuse ST depressions, more pronounced compared to prior tracing.  ACS less likely as patient is not endorsing any chest pain. -Cardiac monitoring.  Trend troponin.  If it continues to trend up postdialysis, consult cardiology.  CAD PVD Hyperlipidemia History of NSVT GERD -Resume home medications after pharmacy med rec is done.  DVT prophylaxis: Subcutaneous heparin Code Status: Full code-discussed with the patient. Family Communication: No family available at this time. Disposition Plan: Status is: Inpatient  Remains inpatient appropriate  because:Inpatient level of care appropriate due to severity of illness  Dispo: The patient is from: Home              Anticipated d/c is to: Home              Patient currently is not medically stable to d/c.   Difficult to place patient No  Level of care: Level of care: Progressive  The medical decision making on this patient was of high complexity and the patient is at high risk for clinical deterioration, therefore this is a level 3 visit.  Shela Leff MD [facility]  If 7PM-7AM, please contact night-coverage [email or web address]  10/16/2020, 11:12 PM

## 2020-10-16 NOTE — ED Triage Notes (Signed)
Pt BIB [facility] from home for Respiratory Distress.  PT has been SOB x2 days w/ increasing weakness/fatigue.  Pt found to be 68% on RA by EMS with bilateral course crackles throughout.  PT is currently 100% on NRB.  PT is MWF dialysis with fistula in rt lower forearm.  Hx of MI and valve replacement.  No hx of CHF, COPD or Covid exposure.   EMS vitals 220/114, sinus Tach at 106.[name] CBTG 125.

## 2020-10-16 NOTE — ED Notes (Signed)
Report attempted to [facility]. Unit to return call.

## 2020-10-16 NOTE — Consult Note (Signed)
Reason for Consult: To manage dialysis and dialysis related needs Referring Physician: [name] is an 70 y.o. male with past medical history significant for amyloidosis, CAD and CHF-  decreased EF, AS s/p TAVR in 2020 as well as ESRD-  HD MWF at [facility].  He admits that he has been cutting his treatments short of late-  under 3 hours.  He had been getting to EDW as of 8/3-  on 8/5 it says his post weight was higher than his pre weight.  Since 8/5-  pt has noted slow worsening SOB-  getting to a point of not being able to eat or finish a sentence so came into the hospital this evening.  He was on a non rebreather-  ER attempted to wean him down but sats dropped-  he currently cannot finish sentences-  he has peripheral edema- BNP over 1000-  CXR cardiomegaly and edema    Dialyzes at [facility] -  mwf  4 hours EDW 79. HD Bath 2/2, Dialyzer 180, Heparin yes- 2400. Access AVF. Profile #4 400 BFR Mircera 50 q 2 weeks Venofer 50 q week Calcitriol 1.5 mcg tiw  Past Medical History:  Diagnosis Date   Amyloidosis ([place])    Anemia of chronic disease    Anxiety    CAD (coronary artery disease)    a. cardiac cath on 11/25/17 with occlusion of the RCA which could have been his event but the RCA could not be engaged. The RCA filled distally from left to right collaterals. There were no severe stenoses in the left coronary system. Medical therapy recommended, started on plavix.   Chronic combined systolic and diastolic CHF (congestive heart failure) ([place])    a. EF dropped to 35-40% with grade 2 DD by echo 11/2017.   DJD (degenerative joint disease), lumbar    ESRD (end stage renal disease) ([place])    ESRD- HEMO MWF- [facility]   GERD (gastroesophageal reflux disease)    Hyperlipidemia    Hypertension    Myocardial infarction [facility])    Neuropathy    Peripheral vascular disease (HCC)    Pneumonia    Restless legs    S/P TAVR (transcatheter aortic valve replacement) 10/21/2018   26 mm Edwards Sapien 3  Ultra transcatheter heart valve placed via percutaneous right transfemoral approach    Severe aortic stenosis    severe    Past Surgical History:  Procedure Laterality Date   ABDOMINAL AORTOGRAM W/LOWER EXTREMITY Bilateral 07/16/2018   Procedure: ABDOMINAL AORTOGRAM W/LOWER EXTREMITY;  Surgeon: Wellington Hampshire, MD;  Location: [place] [facility];  Service: Cardiovascular;  Laterality: Bilateral;   ABDOMINAL AORTOGRAM W/LOWER EXTREMITY N/A 04/09/2019   Procedure: ABDOMINAL AORTOGRAM W/LOWER EXTREMITY - Right Lower;  Surgeon: Waynetta Sandy, MD;  Location: [place] [facility];  Service: Cardiovascular;  Laterality: N/A;   AMPUTATION Left 08/19/2018   Procedure: AMPUTATION LEFT TOES SECOND AND THIRD;  Surgeon: Waynetta Sandy, MD;  Location: [place];  Service: Vascular;  Laterality: Left;   AMPUTATION Right 05/07/2019   Procedure: AMPUTATION BELOW KNEE;  Surgeon: Serafina Mitchell, MD;  Location: [facility];  Service: Vascular;  Laterality: Right;   AV FISTULA PLACEMENT Right 12/27/2015   Procedure: Right Arm ARTERIOVENOUS (AV) FISTULA CREATION;  Surgeon: Angelia Mould, MD;  Location: [place];  Service: Vascular;  Laterality: Right;   CATARACT EXTRACTION W/ INTRAOCULAR LENS  IMPLANT, BILATERAL     COLONOSCOPY     ERCP W/ SPHICTEROTOMY  FEMORAL-POPLITEAL BYPASS GRAFT Left 07/22/2018   Procedure: left FEMORAL TO BELOW KNEE POPLITEAL ARTERY BYPASS GREAFT using 58mm removable ring propaten gore graft;  Surgeon: Waynetta Sandy, MD;  Location: [place];  Service: Vascular;  Laterality: Left;   FEMORAL-POPLITEAL BYPASS GRAFT Right 04/10/2019   Procedure: BYPASS GRAFT RIGHT FEMORAL-POPLITEAL ARTERY;  Surgeon: Rosetta Posner, MD;  Location: [place];  Service: Vascular;  Laterality: Right;   I & D EXTREMITY Right 05/05/2019   Procedure: Revision of  RIGHT  FOOT Amputation;  Surgeon: Waynetta Sandy, MD;  Location: [place];  Service: Vascular;  Laterality: Right;   INTRAOPERATIVE  TRANSTHORACIC ECHOCARDIOGRAM N/A 10/21/2018   Procedure: Intraoperative Transthoracic Echocardiogram;  Surgeon: Sherren Mocha, MD;  Location: [place];  Service: Open Heart Surgery;  Laterality: N/A;   IR GENERIC HISTORICAL  01/27/2016   IR FLUORO GUIDE CV LINE RIGHT 01/27/2016 Arne Cleveland, MD [facility]   IR GENERIC HISTORICAL  01/27/2016   IR US GUIDE VASC ACCESS RIGHT 01/27/2016 Arne Cleveland, MD [facility]   LAPAROSCOPIC CHOLECYSTECTOMY  03/2011   LEFT HEART CATH AND CORONARY ANGIOGRAPHY N/A 11/25/2017   Procedure: LEFT HEART CATH AND CORONARY ANGIOGRAPHY;  Surgeon: Martinique, Peter M, MD;  Location: [place] [facility];  Service: Cardiovascular;  Laterality: N/A;   LEFT HEART CATH AND CORONARY ANGIOGRAPHY N/A 07/24/2018   Procedure: LEFT HEART CATH AND CORONARY ANGIOGRAPHY;  Surgeon: Sherren Mocha, MD;  Location: [place] [facility];  Service: Cardiovascular;  Laterality: N/A;   RENAL BIOPSY, PERCUTANEOUS Right 09/08/2014   RIGHT/LEFT HEART CATH AND CORONARY ANGIOGRAPHY N/A 03/25/2018   Procedure: RIGHT/LEFT HEART CATH AND CORONARY ANGIOGRAPHY;  Surgeon: Martinique, Peter M, MD;  Location: [place] [facility];  Service: Cardiovascular;  Laterality: N/A;   TEE WITHOUT CARDIOVERSION N/A 05/14/2019   Procedure: TRANSESOPHAGEAL ECHOCARDIOGRAM (TEE);  Surgeon: Fay Records, MD;  Location: [facility] ENDOSCOPY;  Service: Cardiovascular;  Laterality: N/A;   TOE AMPUTATION Left 08/19/2018   2nd & 3rd toes   TONSILLECTOMY  ~ 1960   TRANSCATHETER AORTIC VALVE REPLACEMENT, TRANSFEMORAL N/A 10/21/2018   Procedure: TRANSCATHETER AORTIC VALVE REPLACEMENT, TRANSFEMORAL;  Surgeon: Sherren Mocha, MD;  Location: [place];  Service: Open Heart Surgery;  Laterality: N/A;   TRANSMETATARSAL AMPUTATION Right 04/29/2019   Procedure: RIGHT TRANSMETATARSAL AMPUTATION;  Surgeon: Waynetta Sandy, MD;  Location: [place];  Service: Vascular;  Laterality: Right;   ULTRASOUND GUIDANCE FOR VASCULAR ACCESS  03/25/2018   Procedure:  Ultrasound Guidance For Vascular Access;  Surgeon: Martinique, Peter M, MD;  Location: [place] [facility];  Service: Cardiovascular;;    Family History  Problem Relation Age of Onset   Hypertension Mother    Diabetes Mother    Heart disease Mother    Skin cancer Mother    Hypertension Father    Diabetes Father    Diabetes Sister     Social History:  reports that he quit smoking about 9 years ago. His smoking use included cigarettes. He has a 80.00 pack-year smoking history. He has never used smokeless tobacco. He reports that he does not drink alcohol and does not use drugs.  Allergies: No Known Allergies  Medications: I have reviewed the patient's current medications.   Results for orders placed or performed during the hospital encounter of 10/16/20 (from the past 48 hour(s))  CBC with Differential/Platelet     Status: Abnormal   Collection Time: 10/16/20  8:16 PM  Result Value Ref Range   WBC 9.2 4.0 - 10.5  K/uL   RBC 3.44 (L) 4.22 - 5.81 MIL/uL   Hemoglobin 10.8 (L) 13.0 - 17.0 g/dL   HCT 33.5 (L) 39.0 - 52.0 %   MCV 97.4 80.0 - 100.0 fL   MCH 31.4 26.0 - 34.0 pg   MCHC 32.2 30.0 - 36.0 g/dL   RDW 15.9 (H) 11.5 - 15.5 %   Platelets 149 (L) 150 - 400 K/uL   nRBC 0.0 0.0 - 0.2 %   Neutrophils Relative % 81 %   Neutro Abs 7.4 1.7 - 7.7 K/uL   Lymphocytes Relative 13 %   Lymphs Abs 1.2 0.7 - 4.0 K/uL   Monocytes Relative 4 %   Monocytes Absolute 0.4 0.1 - 1.0 K/uL   Eosinophils Relative 1 %   Eosinophils Absolute 0.1 0.0 - 0.5 K/uL   Basophils Relative 0 %   Basophils Absolute 0.0 0.0 - 0.1 K/uL   Immature Granulocytes 1 %   Abs Immature Granulocytes 0.08 (H) 0.00 - 0.07 K/uL    Comment: Performed at [facility] [address], [place], [place] [address]  Comprehensive metabolic panel     Status: Abnormal   Collection Time: 10/16/20  8:16 PM  Result Value Ref Range   Sodium 141 135 - 145 mmol/L   Potassium 4.3 3.5 - 5.1 mmol/L   Chloride 96 (L) 98 - 111 mmol/L    CO2 26 22 - 32 mmol/L   Glucose, Bld 123 (H) 70 - 99 mg/dL    Comment: Glucose reference range applies only to samples taken after fasting for at least 8 hours.   BUN 45 (H) 8 - 23 mg/dL   Creatinine, Ser 10.56 (H) 0.61 - 1.24 mg/dL   Calcium 9.7 8.9 - 10.3 mg/dL   Total Protein 7.1 6.5 - 8.1 g/dL   Albumin 3.8 3.5 - 5.0 g/dL   AST 17 15 - 41 U/L   ALT 13 0 - 44 U/L   Alkaline Phosphatase 57 38 - 126 U/L   Total Bilirubin 0.9 0.3 - 1.2 mg/dL   GFR, Estimated 5 (L) >60 mL/min    Comment: (NOTE) Calculated using the CKD-EPI Creatinine Equation (2021)    Anion gap 19 (H) 5 - 15    Comment: Performed at [facility], [place] [address] [place], [place] [address]  Troponin I (High Sensitivity)     Status: Abnormal   Collection Time: 10/16/20  8:16 PM  Result Value Ref Range   Troponin I (High Sensitivity) 73 (H) <18 ng/L    Comment: (NOTE) Elevated high sensitivity troponin I (hsTnI) values and significant  changes across serial measurements may suggest ACS but many other  chronic and acute conditions are known to elevate hsTnI results.  Refer to the "Links" section for chest pain algorithms and additional  guidance. Performed at [facility], [place] [address], [place], [place] [address]   Brain natriuretic peptide     Status: Abnormal   Collection Time: 10/16/20  8:34 PM  Result Value Ref Range   B Natriuretic Peptide 1,084.5 (H) 0.0 - 100.0 pg/mL    Comment: Performed at [facility] [address], [place], [place] [address]  Resp Panel by RT-PCR (Flu A&B, Covid) Nasopharyngeal Swab     Status: None   Collection Time: 10/16/20  8:36 PM   Specimen: Nasopharyngeal Swab; Nasopharyngeal(NP) swabs in vial transport medium  Result Value Ref Range   SARS Coronavirus 2 by RT PCR NEGATIVE NEGATIVE  Comment: (NOTE) SARS-CoV-2 target nucleic acids are NOT DETECTED.  The SARS-CoV-2 RNA is generally detectable in upper respiratory specimens during the acute phase of  infection. The lowest concentration of SARS-CoV-2 viral copies this assay can detect is 138 copies/mL. A negative result does not preclude SARS-Cov-2 infection and should not be used as the sole basis for treatment or other patient management decisions. A negative result may occur with  improper specimen collection/handling, submission of specimen other than nasopharyngeal swab, presence of viral mutation(s) within the areas targeted by this assay, and inadequate number of viral copies(<138 copies/mL). A negative result must be combined with clinical observations, patient history, and epidemiological information. The expected result is Negative.  Fact Sheet for Patients:  EntrepreneurPulse.com.au  Fact Sheet for Healthcare Providers:  IncredibleEmployment.be  This test is no t yet approved or cleared by the [place] FDA and  has been authorized for detection and/or diagnosis of SARS-CoV-2 by FDA under an Emergency Use Authorization (EUA). This EUA will remain  in effect (meaning this test can be used) for the duration of the COVID-19 declaration under Section 564(b)(1) of the Act, 21 U.S.C.section 360bbb-3(b)(1), unless the authorization is terminated  or revoked sooner.       Influenza A by PCR NEGATIVE NEGATIVE   Influenza B by PCR NEGATIVE NEGATIVE    Comment: (NOTE) The Xpert Xpress SARS-CoV-2/FLU/RSV plus assay is intended as an aid in the diagnosis of influenza from Nasopharyngeal swab specimens and should not be used as a sole basis for treatment. Nasal washings and aspirates are unacceptable for Xpert Xpress SARS-CoV-2/FLU/RSV testing.  Fact Sheet for Patients: EntrepreneurPulse.com.au  Fact Sheet for Healthcare Providers: IncredibleEmployment.be  This test is not yet approved or cleared by the [place] FDA and has been authorized for detection and/or diagnosis of SARS-CoV-2 by FDA  under an Emergency Use Authorization (EUA). This EUA will remain in effect (meaning this test can be used) for the duration of the COVID-19 declaration under Section 564(b)(1) of the Act, 21 U.S.C. section 360bbb-3(b)(1), unless the authorization is terminated or revoked.  Performed at [facility], [place] [address] [place], [place] [address]   I-stat chem 8, ED (not at [facility] or [facility])     Status: Abnormal   Collection Time: 10/16/20  8:47 PM  Result Value Ref Range   Sodium 138 135 - 145 mmol/L   Potassium 4.2 3.5 - 5.1 mmol/L   Chloride 99 98 - 111 mmol/L   BUN 40 (H) 8 - 23 mg/dL   Creatinine, Ser 10.70 (H) 0.61 - 1.24 mg/dL   Glucose, Bld 120 (H) 70 - 99 mg/dL    Comment: Glucose reference range applies only to samples taken after fasting for at least 8 hours.   Calcium, Ion 1.05 (L) 1.15 - 1.40 mmol/L   TCO2 27 22 - 32 mmol/L   Hemoglobin 10.9 (L) 13.0 - 17.0 g/dL   HCT 32.0 (L) 39.0 - 52.0 %  I-Stat venous blood gas, ED     Status: Abnormal   Collection Time: 10/16/20  8:47 PM  Result Value Ref Range   pH, Ven 7.399 7.250 - 7.430   pCO2, Ven 48.6 44.0 - 60.0 mmHg   pO2, Ven 52.0 (H) 32.0 - 45.0 mmHg   Bicarbonate 30.1 (H) 20.0 - 28.0 mmol/L   TCO2 32 22 - 32 mmol/L   O2 Saturation 86.0 %   Acid-Base Excess 4.0 (H) 0.0 - 2.0 mmol/L   Sodium 139 135 - 145 mmol/L  Potassium 4.2 3.5 - 5.1 mmol/L   Calcium, Ion 1.13 (L) 1.15 - 1.40 mmol/L   HCT 34.0 (L) 39.0 - 52.0 %   Hemoglobin 11.6 (L) 13.0 - 17.0 g/dL   Sample type VENOUS    *Note: Due to a large number of results and/or encounters for the requested time period, some results have not been displayed. A complete set of results can be found in Results Review.    DG Chest Port 1 View  Result Date: 10/16/2020 CLINICAL DATA:  Shortness of breath EXAM: PORTABLE CHEST 1 VIEW COMPARISON:  05/11/2019 FINDINGS: Cardiomegaly. Prior aortic valve repair. Diffuse interstitial prominence throughout the lungs, favor edema. Small  bilateral effusions. No acute bony abnormality. IMPRESSION: Cardiomegaly. Diffuse interstitial prominence throughout the lungs, favor edema/CHF. Small effusions. Electronically Signed   By: Rolm Baptise M.D.   On: 10/16/2020 20:27    ROS: positive for SOB, DOE, orthopnea Blood pressure (!) 184/82, pulse 93, temperature 98.4 F (36.9 C), temperature source Oral, resp. rate (!) 29, weight 80 kg, SpO2 (!) 89 %. General appearance: alert, appears older than stated age, and moderate distress Resp: rales bilaterally and rhonchi bilaterally Cardio: systolic murmur: holosystolic 3/6, harsh throughout the precordium GI: soft, non-tender; bowel sounds normal; no masses,  no organomegaly Extremities: edema 1-2+ and amputation on the right Right forearm AVF  Assessment/Plan: 70 year old WM-  multiple medical issues including CAD and ESRD-  presenting with SOB 1 SOB/DOE-  seems consistent with volume overload and pulmonary edema.  Pt must have lost body weight and his EDW has not been lowered-  in addition , he has been cutting treatments short.  HD this evening for ultrafiltration.  Also elevated troponin-  to trend 2 ESRD: normally HD MWF via AVF at [facility]-  semi emergent HD tonight for volume removal 3 Hypertension: BP is high-  likely due to volume overload and distress-  only on norvasc 5 as OP 4. Anemia of ESRD: is not in a terrible place at present-  no esa or iron needed 5. Metabolic Bone Disease: will continue his OP calcitriol and also appears to be on fosrenol for phos binding    Louis Meckel 10/16/2020, 10:09 PM

## 2020-10-16 NOTE — ED Provider Notes (Signed)
[facility] EMERGENCY DEPARTMENT Provider Note   CSN: [id number] Arrival date & time: 10/16/20  2006     History No chief complaint on file.   HILARIO ROBARTS is a 70 y.o. male history of CAD, heart failure, ESRD on dialysis here presenting with shortness of breath.  Patient last got dialyzed 2 days ago.  He has worsening shortness of breath since last 2 days.  Patient was noted to be hypoxic 68% on room air per EMS.  He was also noted to have crackles throughout.  Patient was put on nonrebreather.  Patient states that he is not on oxygen at baseline   The history is provided by the patient.      Past Medical History:  Diagnosis Date   Amyloidosis ([place])    Anemia of chronic disease    Anxiety    CAD (coronary artery disease)    a. cardiac cath on 11/25/17 with occlusion of the RCA which could have been his event but the RCA could not be engaged. The RCA filled distally from left to right collaterals. There were no severe stenoses in the left coronary system. Medical therapy recommended, started on plavix.   Chronic combined systolic and diastolic CHF (congestive heart failure) ([place])    a. EF dropped to 35-40% with grade 2 DD by echo 11/2017.   DJD (degenerative joint disease), lumbar    ESRD (end stage renal disease) ([place])    ESRD- HEMO MWF- [place]   GERD (gastroesophageal reflux disease)    Hyperlipidemia    Hypertension    Myocardial infarction [facility])    Neuropathy    Peripheral vascular disease (HCC)    Pneumonia    Restless legs    S/P TAVR (transcatheter aortic valve replacement) 10/21/2018   26 mm Edwards Sapien 3 Ultra transcatheter heart valve placed via percutaneous right transfemoral approach    Severe aortic stenosis    severe    Patient Active Problem List   Diagnosis Date Noted   Pain due to onychomycosis of toenail of left foot 08/30/2020   Lumbar spondylosis 01/23/2020   Neuropathy 10/06/2019   Gait abnormality 08/06/2019   Back  muscle spasm 07/09/2019   Sleep disturbance    Nausea without vomiting    Orthostatic hypotension    Acute on chronic anemia    Chronic combined systolic and diastolic congestive heart failure ([place])    ESRD on dialysis ([place])    Right below-knee amputee ([place]) 05/18/2019   Pressure injury of skin 05/14/2019   Enterococcal bacteremia 05/13/2019   Headache, unspecified 01/12/2019   Allergy, unspecified, initial encounter 12/29/2018   Polymorphic ventricular tachycardia ([place]) 10/24/2018   S/P TAVR (transcatheter aortic valve replacement) 10/21/2018   Anxiety    Coronary artery disease involving native coronary artery of native heart without angina pectoris    Thrombocytopenia (HCC)    PAD (peripheral artery disease) ([place]) 07/22/2018   Acute on chronic combined systolic and diastolic CHF (congestive heart failure) ([place]) 11/30/2017   Hypercalcemia 06/24/2017   Encounter for removal of sutures 08/15/2016   Other mechanical complication of surgically created arteriovenous fistula, subsequent encounter 06/22/2016   Unspecified protein-calorie malnutrition ([place]) 02/06/2016   Secondary hyperparathyroidism of renal origin ([place]) 02/06/2016   Pruritus, unspecified 02/06/2016   Other specified coagulation defects ([place]) 02/06/2016   Hypokalemia 02/06/2016   Diarrhea, unspecified 37/90/2409   Complication of vascular dialysis catheter 02/06/2016   Aftercare including intermittent dialysis ([place]) 02/06/2016   ESRD (end stage renal disease)  on dialysis [facility])    Iron deficiency anemia 11/22/2015   AL amyloidosis ([place]) 09/25/2014   Anemia of chronic disease 09/25/2014   Essential hypertension, benign 11/06/2013    Past Surgical History:  Procedure Laterality Date   ABDOMINAL AORTOGRAM W/LOWER EXTREMITY Bilateral 07/16/2018   Procedure: ABDOMINAL AORTOGRAM W/LOWER EXTREMITY;  Surgeon: Wellington Hampshire, MD;  Location: [place] CV LAB;  Service: Cardiovascular;  Laterality: Bilateral;   ABDOMINAL  AORTOGRAM W/LOWER EXTREMITY N/A 04/09/2019   Procedure: ABDOMINAL AORTOGRAM W/LOWER EXTREMITY - Right Lower;  Surgeon: Waynetta Sandy, MD;  Location: [place] CV LAB;  Service: Cardiovascular;  Laterality: N/A;   AMPUTATION Left 08/19/2018   Procedure: AMPUTATION LEFT TOES SECOND AND THIRD;  Surgeon: Waynetta Sandy, MD;  Location: [place];  Service: Vascular;  Laterality: Left;   AMPUTATION Right 05/07/2019   Procedure: AMPUTATION BELOW KNEE;  Surgeon: Serafina Mitchell, MD;  Location: [place];  Service: Vascular;  Laterality: Right;   AV FISTULA PLACEMENT Right 12/27/2015   Procedure: Right Arm ARTERIOVENOUS (AV) FISTULA CREATION;  Surgeon: Angelia Mould, MD;  Location: [place];  Service: Vascular;  Laterality: Right;   CATARACT EXTRACTION W/ INTRAOCULAR LENS  IMPLANT, BILATERAL     COLONOSCOPY     ERCP W/ SPHICTEROTOMY     FEMORAL-POPLITEAL BYPASS GRAFT Left 07/22/2018   Procedure: left FEMORAL TO BELOW KNEE POPLITEAL ARTERY BYPASS GREAFT using 88mm removable ring propaten gore graft;  Surgeon: Waynetta Sandy, MD;  Location: [place];  Service: Vascular;  Laterality: Left;   FEMORAL-POPLITEAL BYPASS GRAFT Right 04/10/2019   Procedure: BYPASS GRAFT RIGHT FEMORAL-POPLITEAL ARTERY;  Surgeon: Rosetta Posner, MD;  Location: [place];  Service: Vascular;  Laterality: Right;   I & D EXTREMITY Right 05/05/2019   Procedure: Revision of  RIGHT  FOOT Amputation;  Surgeon: Waynetta Sandy, MD;  Location: [place];  Service: Vascular;  Laterality: Right;   INTRAOPERATIVE TRANSTHORACIC ECHOCARDIOGRAM N/A 10/21/2018   Procedure: Intraoperative Transthoracic Echocardiogram;  Surgeon: Sherren Mocha, MD;  Location: [place];  Service: Open Heart Surgery;  Laterality: N/A;   IR GENERIC HISTORICAL  01/27/2016   IR FLUORO GUIDE CV LINE RIGHT 01/27/2016 Arne Cleveland, MD [facility]-INTERV RAD   IR GENERIC HISTORICAL  01/27/2016   IR US GUIDE VASC ACCESS RIGHT 01/27/2016 Arne Cleveland, MD  [facility]   LAPAROSCOPIC CHOLECYSTECTOMY  03/2011   LEFT HEART CATH AND CORONARY ANGIOGRAPHY N/A 11/25/2017   Procedure: LEFT HEART CATH AND CORONARY ANGIOGRAPHY;  Surgeon: Martinique, Peter M, MD;  Location: [place] CV LAB;  Service: Cardiovascular;  Laterality: N/A;   LEFT HEART CATH AND CORONARY ANGIOGRAPHY N/A 07/24/2018   Procedure: LEFT HEART CATH AND CORONARY ANGIOGRAPHY;  Surgeon: Sherren Mocha, MD;  Location: [place] CV LAB;  Service: Cardiovascular;  Laterality: N/A;   RENAL BIOPSY, PERCUTANEOUS Right 09/08/2014   RIGHT/LEFT HEART CATH AND CORONARY ANGIOGRAPHY N/A 03/25/2018   Procedure: RIGHT/LEFT HEART CATH AND CORONARY ANGIOGRAPHY;  Surgeon: Martinique, Peter M, MD;  Location: [place] [facility] LAB;  Service: Cardiovascular;  Laterality: N/A;   TEE WITHOUT CARDIOVERSION N/A 05/14/2019   Procedure: TRANSESOPHAGEAL ECHOCARDIOGRAM (TEE);  Surgeon: Fay Records, MD;  Location: [facility] ENDOSCOPY;  Service: Cardiovascular;  Laterality: N/A;   TOE AMPUTATION Left 08/19/2018   2nd & 3rd toes   TONSILLECTOMY  ~ 1960   TRANSCATHETER AORTIC VALVE REPLACEMENT, TRANSFEMORAL N/A 10/21/2018   Procedure: TRANSCATHETER AORTIC VALVE REPLACEMENT, TRANSFEMORAL;  Surgeon: Sherren Mocha, MD;  Location: [place];  Service:  Open Heart Surgery;  Laterality: N/A;   TRANSMETATARSAL AMPUTATION Right 04/29/2019   Procedure: RIGHT TRANSMETATARSAL AMPUTATION;  Surgeon: Waynetta Sandy, MD;  Location: [place];  Service: Vascular;  Laterality: Right;   ULTRASOUND GUIDANCE FOR VASCULAR ACCESS  03/25/2018   Procedure: Ultrasound Guidance For Vascular Access;  Surgeon: Martinique, Peter M, MD;  Location: [place] CV LAB;  Service: Cardiovascular;;       Family History  Problem Relation Age of Onset   Hypertension Mother    Diabetes Mother    Heart disease Mother    Skin cancer Mother    Hypertension Father    Diabetes Father    Diabetes Sister     Social History   Tobacco Use   Smoking status: Former     Packs/day: 2.00    Years: 40.00    Pack years: 80.00    Types: Cigarettes    Quit date: 03/19/2011    Years since quitting: 9.5   Smokeless tobacco: Never  Vaping Use   Vaping Use: Never used  Substance Use Topics   Alcohol use: No    Alcohol/week: 0.0 standard drinks   Drug use: No    Comment:  "I have used marijuana till the 1990's". Notes that he quit 15 yrs ago    Home Medications Prior to Admission medications   Medication Sig Start Date End Date Taking? Authorizing Provider  acetaminophen (TYLENOL) 325 MG tablet Take 1-2 tablets (325-650 mg total) by mouth every 4 (four) hours as needed for mild pain (or temp >/= 101 F). 05/27/19   Angiulli, Lavon Paganini, PA-C  amiodarone (PACERONE) 200 MG tablet TAKE 1 TABLET (200 MG TOTAL) BY MOUTH DAILY. 09/07/20 09/07/21  Burnell Blanks, MD  amLODipine (NORVASC) 5 MG tablet Take 1 tablet (5 mg total) by mouth at bedtime. 09/28/20     atorvastatin (LIPITOR) 80 MG tablet TAKE 1 TABLET BY MOUTH DAILY WITH SUPPER 04/28/20 04/28/21  Burnell Blanks, MD  clopidogrel (PLAVIX) 75 MG tablet TAKE 1 TABLET BY MOUTH DAILY WITH SUPPER 04/28/20 04/28/21  Burnell Blanks, MD  Darbepoetin Alfa (ARANESP) 100 MCG/0.5ML SOSY injection Inject 0.5 mLs (100 mcg total) into the vein every Friday with hemodialysis. 05/29/19   Angiulli, Lavon Paganini, PA-C  docusate sodium (COLACE) 100 MG capsule Take 1 capsule (100 mg total) by mouth 2 (two) times daily. 05/27/19   Angiulli, Lavon Paganini, PA-C  iron sucrose in sodium chloride 0.9 % 100 mL Iron Sucrose (Venofer) 07/27/20 07/26/21  [provider]  lanthanum (FOSRENOL) 1000 MG chewable tablet Chew 2,000 mg by mouth 3 (three) times daily. 07/15/20   [provider]  LORazepam (ATIVAN) 1 MG tablet Take 1 tablet (1 mg total) by mouth 2 (two) times daily as needed for anxiety. 05/27/19   Angiulli, Lavon Paganini, PA-C  Methoxy PEG-Epoetin Beta (MIRCERA IJ) Mircera 06/08/20 06/07/21  [provider]   multivitamin (RENA-VIT) TABS tablet Take 1 tablet by mouth at bedtime. 08/12/18   Angiulli, Lavon Paganini, PA-C  ondansetron (ZOFRAN) 4 MG tablet TAKE 1 TABLET BY MOUTH TWICE A DAY AS NEEDED 06/28/20 06/28/21  Wellington Hampshire, MD  oxyCODONE (OXY IR/ROXICODONE) 5 MG immediate release tablet Take 1-2 tablets (5-10 mg total) by mouth every 4 (four) hours as needed for moderate pain. 05/27/19   Angiulli, Lavon Paganini, PA-C  Oxycodone HCl 10 MG TABS TAKE 1 TABLET BY MOUTH 4 TIMES A DAY AS NEEDED. 10/13/20     pantoprazole (PROTONIX) 40 MG tablet Take  1 tablet (40 mg total) by mouth daily. 05/28/19   Angiulli, Lavon Paganini, PA-C  vitamin B-12 (CYANOCOBALAMIN) 1000 MCG tablet Take 1 tablet (1,000 mcg total) by mouth every evening. 05/27/19   Angiulli, Lavon Paganini, PA-C    Allergies    Patient has no known allergies.  Review of Systems   Review of Systems  Respiratory:  Positive for shortness of breath.   All other systems reviewed and are negative.  Physical Exam Updated Vital Signs BP (!) 199/75   Pulse 94   Temp 98.4 F (36.9 C) (Oral)   Resp (!) 32   Wt 80 kg   SpO2 95%   BMI 25.31 kg/m   Physical Exam Vitals and nursing note reviewed.  Constitutional:      Comments: Chronically ill and tachypneic   HENT:     Head: Normocephalic.     Nose: Nose normal.     Mouth/Throat:     Mouth: Mucous membranes are dry.  Eyes:     Extraocular Movements: Extraocular movements intact.     Pupils: Pupils are equal, round, and reactive to light.  Cardiovascular:     Rate and Rhythm: Regular rhythm. Tachycardia present.  Pulmonary:     Comments: Tachypneic, crackles bilateral bases, mild wheezing  Musculoskeletal:     Cervical back: Normal range of motion.     Comments: L BKA   Skin:    General: Skin is warm.     Capillary Refill: Capillary refill takes less than 2 seconds.  Neurological:     General: No focal deficit present.     Mental Status: He is oriented to person, place, and time.  Psychiatric:         Mood and Affect: Mood normal.        Behavior: Behavior normal.    ED Results / Procedures / Treatments   Labs (all labs ordered are listed, but only abnormal results are displayed) Labs Reviewed  CBC WITH DIFFERENTIAL/PLATELET - Abnormal; Notable for the following components:      Result Value   RBC 3.44 (*)    Hemoglobin 10.8 (*)    HCT 33.5 (*)    RDW 15.9 (*)    Platelets 149 (*)    Abs Immature Granulocytes 0.08 (*)    All other components within normal limits  COMPREHENSIVE METABOLIC PANEL - Abnormal; Notable for the following components:   Chloride 96 (*)    Glucose, Bld 123 (*)    BUN 45 (*)    Creatinine, Ser 10.56 (*)    GFR, Estimated 5 (*)    Anion gap 19 (*)    All other components within normal limits  I-STAT CHEM 8, ED - Abnormal; Notable for the following components:   BUN 40 (*)    Creatinine, Ser 10.70 (*)    Glucose, Bld 120 (*)    Calcium, Ion 1.05 (*)    Hemoglobin 10.9 (*)    HCT 32.0 (*)    All other components within normal limits  I-STAT VENOUS BLOOD GAS, ED - Abnormal; Notable for the following components:   pO2, Ven 52.0 (*)    Bicarbonate 30.1 (*)    Acid-Base Excess 4.0 (*)    Calcium, Ion 1.13 (*)    HCT 34.0 (*)    Hemoglobin 11.6 (*)    All other components within normal limits  TROPONIN I (HIGH SENSITIVITY) - Abnormal; Notable for the following components:   Troponin I (High Sensitivity) 73 (*)  All other components within normal limits  RESP PANEL BY RT-PCR (FLU A&B, COVID) ARPGX2  BLOOD GAS, VENOUS  BRAIN NATRIURETIC PEPTIDE    EKG EKG Interpretation  Date/Time:  Sunday October 16 2020 20:11:54 EDT Ventricular Rate:  105 PR Interval:  167 QRS Duration: 166 QT Interval:  376 QTC Calculation: 497 R Axis:   109 Text Interpretation: Sinus or ectopic atrial tachycardia RBBB and LPFB Consider left ventricular hypertrophy Repol abnrm suggests ischemia, diffuse leads ST depressions unchanged from previous Confirmed by Wandra Arthurs ([id number]) on 10/16/2020 8:55:35 PM  Radiology DG Chest Port 1 View  Result Date: 10/16/2020 CLINICAL DATA:  Shortness of breath EXAM: PORTABLE CHEST 1 VIEW COMPARISON:  05/11/2019 FINDINGS: Cardiomegaly. Prior aortic valve repair. Diffuse interstitial prominence throughout the lungs, favor edema. Small bilateral effusions. No acute bony abnormality. IMPRESSION: Cardiomegaly. Diffuse interstitial prominence throughout the lungs, favor edema/CHF. Small effusions. Electronically Signed   By: Rolm Baptise M.D.   On: 10/16/2020 20:27    Procedures Procedures   CRITICAL CARE Performed by: Wandra Arthurs   Total critical care time:40 minutes  Critical care time was exclusive of separately billable procedures and treating other patients.  Critical care was necessary to treat or prevent imminent or life-threatening deterioration.  Critical care was time spent personally by me on the following activities: development of treatment plan with patient and/or surrogate as well as nursing, discussions with consultants, evaluation of patient's response to treatment, examination of patient, obtaining history from patient or surrogate, ordering and performing treatments and interventions, ordering and review of laboratory studies, ordering and review of radiographic studies, pulse oximetry and re-evaluation of patient's condition.  Angiocath insertion Performed by: Wandra Arthurs  Consent: Verbal consent obtained. Risks and benefits: risks, benefits and alternatives were discussed Time out: Immediately prior to procedure a "time out" was called to verify the correct patient, procedure, equipment, support staff and site/side marked as required.  Preparation: Patient was prepped and draped in the usual sterile fashion.  Vein Location: L antecube  Ultrasound Guided  Gauge: 20 long   Normal blood return and flush without difficulty Patient tolerance: Patient tolerated the procedure well with no immediate  complications.    Medications Ordered in ED Medications  methylPREDNISolone sodium succinate (SOLU-MEDROL) 125 mg/2 mL injection 125 mg (125 mg Intravenous Given 10/16/20 2044)  albuterol (VENTOLIN HFA) 108 (90 Base) MCG/ACT inhaler 2 puff (2 puffs Inhalation Given 10/16/20 2044)    ED Course  I have reviewed the triage vital signs and the nursing notes.  Pertinent labs & imaging results that were available during my care of the patient were reviewed by me and considered in my medical decision making (see chart for details).    MDM Rules/Calculators/A&P                           SPIROS GREENFELD is a 70 y.o. male here presenting with shortness of breath.  Patient appears to be in pulmonary edema.  He is hypoxic requiring nonrebreather.  I am concerned that he may need emergent dialysis.  He also has EKG changes and worsening T wave inversions and may be hyperkalemic as well.  9:45 PM Chest x-ray showed pulmonary edema.  Dr. [name] saw patient and will take him up for dialysis soon. His troponin is elevated at 77. He will need to be admitted for rule out ACS.   Final Clinical Impression(s) / ED Diagnoses Final diagnoses:  None    Rx / DC Orders ED Discharge Orders     None        Drenda Freeze, MD 10/16/20 2200

## 2020-10-17 ENCOUNTER — Inpatient Hospital Stay (HOSPITAL_COMMUNITY): Payer: Medicare Other

## 2020-10-17 ENCOUNTER — Other Ambulatory Visit: Payer: Self-pay

## 2020-10-17 DIAGNOSIS — J810 Acute pulmonary edema: Secondary | ICD-10-CM

## 2020-10-17 DIAGNOSIS — N186 End stage renal disease: Secondary | ICD-10-CM

## 2020-10-17 DIAGNOSIS — Z992 Dependence on renal dialysis: Secondary | ICD-10-CM

## 2020-10-17 DIAGNOSIS — I160 Hypertensive urgency: Secondary | ICD-10-CM

## 2020-10-17 DIAGNOSIS — J9601 Acute respiratory failure with hypoxia: Secondary | ICD-10-CM

## 2020-10-17 DIAGNOSIS — I5043 Acute on chronic combined systolic (congestive) and diastolic (congestive) heart failure: Secondary | ICD-10-CM

## 2020-10-17 LAB — GLUCOSE, CAPILLARY
Glucose-Capillary: 164 mg/dL — ABNORMAL HIGH (ref 70–99)
Glucose-Capillary: 142 mg/dL — ABNORMAL HIGH (ref 70–99)
Glucose-Capillary: 124 mg/dL — ABNORMAL HIGH (ref 70–99)

## 2020-10-17 MED ORDER — AMIODARONE HCL 200 MG PO TABS
200.0000 mg | ORAL_TABLET | Freq: Every day | ORAL | Status: DC
  Administered 2020-10-17 – 2020-10-18 (×2): 200 mg via ORAL
  Filled 2020-10-17 (×2): qty 1

## 2020-10-17 MED ORDER — HEPARIN SODIUM (PORCINE) 5000 UNIT/ML IJ SOLN
5000.0000 [IU] | Freq: Three times a day (TID) | INTRAMUSCULAR | Status: DC
  Administered 2020-10-17 – 2020-10-18 (×4): 5000 [IU] via SUBCUTANEOUS
  Filled 2020-10-17 (×4): qty 1

## 2020-10-17 MED ORDER — CLOPIDOGREL BISULFATE 75 MG PO TABS
75.0000 mg | ORAL_TABLET | Freq: Every day | ORAL | Status: DC
  Administered 2020-10-17 – 2020-10-18 (×2): 75 mg via ORAL
  Filled 2020-10-17 (×2): qty 1

## 2020-10-17 MED ORDER — ATORVASTATIN CALCIUM 80 MG PO TABS
80.0000 mg | ORAL_TABLET | Freq: Every day | ORAL | Status: DC
  Administered 2020-10-17 – 2020-10-18 (×2): 80 mg via ORAL
  Filled 2020-10-17 (×2): qty 1

## 2020-10-17 MED ORDER — LABETALOL HCL 5 MG/ML IV SOLN
20.0000 mg | INTRAVENOUS | Status: DC | PRN
  Administered 2020-10-17: 20 mg via INTRAVENOUS
  Filled 2020-10-17: qty 4

## 2020-10-17 NOTE — Progress Notes (Addendum)
[facility] Progress Note  Subjective: seen in room  Vitals:   10/17/20 0415 10/17/20 0435 10/17/20 0506 10/17/20 0736  BP:  (!) 191/78 (!) 203/64 (!) 161/59  Pulse: 77 89 84 80  Resp: (!) 21 20 19 20   Temp:  98.4 F (36.9 C) 97.6 F (36.4 C) 98.2 F (36.8 C)  TempSrc:  Oral Oral Oral  SpO2: 99% 95% 98% 100%  Weight:   77.3 kg   Height:   5\' 10"  (1.778 m)     Exam: General appearance: alert, nad Resp: clear bilat  Cardio: systolic murmur: holosystolic 3/6, harsh throughout the precordium GI: soft, non-tender; bowel sounds normal; no masses,  no organomegaly Extremities: 1+ pedal/ ankle edema on L leg, BKA on the R no edema, no other edema RFA AVF+bruit    OP HD: MWF [facility]  4h  79kg  2/2 bath  Hep 24000  AVF  P4  BFR 400  Mircera 50 q 2 weeks Venofer 50 q week Calcitriol 1.5 mcg tiw  Assessment/ Plan: SOB/DOE-  consistent with volume overload and pulmonary edema.  Pt may have lost body weight. 2kg under dry today after 5 L UF overnight. He has been cutting treatments short. Breathing much better.  Wean off O2 and repeat CXR.  ESRD: normally HD MWF via [facility]. Had HD last night here as above. Plan short extra HD tomorrow to lower dry wt further. Hypertension: only on norvasc 5 as OP Anemia of ESRD: Hb 10-12 here, no esa or iron needed Metabolic Bone Disease: will continue his OP calcitriol and also appears to be on fosrenol for phos binding      [name] Reita Shindler 10/17/2020, 10:13 AM   Recent Labs  Lab 10/16/20 2016 10/16/20 2047  K 4.3 4.2  4.2  BUN 45* 40*  CREATININE 10.56* 10.70*  CALCIUM 9.7  --   HGB 10.8* 11.6*  10.9*   Inpatient medications:  calcitRIOL  1.5 mcg Oral Q M,W,F-HD   Chlorhexidine Gluconate Cloth  6 each Topical Q0600    hydrALAZINE

## 2020-10-17 NOTE — Progress Notes (Signed)
[facility]  PROGRESS NOTE  Jose Garrett ZCH:[id number] DOB: May 23, 1950 DOA: 10/16/2020 PCP: [name], NP   Brief HPI:   70 year old male with a history of ESRD on HD, MWF, chronic combined systolic and diastolic CHF, severe aortic stenosis s/p TAVR, history of NSVT post TAVR on amiodarone, CAD, PVD s/p right BKA, anemia, hypertension presents to ED for evaluation of respiratory distress.  Patient was found to be hypotensive with BP 220/114.  O2 sats was 68% on room air per EMS with bilateral coarse crackles, placed on NRB and O2 sats improved to 100%.  Chest x-ray showed pulmonary edema.  Nephrology was consulted for hemodialysis.    Subjective   Patient seen and examined, breathing is improved since hemodialysis yesterday.  Currently requiring 3 L/min of oxygen.   Assessment/Plan:    Acute hypoxemic respiratory failure -Secondary to pulmonary edema in setting of ESRD, chronic combined CHF -Improved after hemodialysis done yesterday -Still requiring 3 L/min of oxygen -Wean off O2 as tolerated  Hypertensive urgency -Secondary to volume overload -Blood pressure has significantly improved after hemodialysis  Troponin elevation -Troponin was elevated at 77 -Repeat troponin 73, 74 -BNP evaded 1084, likely demand ischemia from volume overload -Patient not endorsing any chest pain  History of nonsustained V. Tach -Continue amiodarone  History of CAD -Continue Plavix, Lipitor   Scheduled medications:    calcitRIOL  1.5 mcg Oral Q M,W,F-HD   Chlorhexidine Gluconate Cloth  6 each Topical Q0600         Data Reviewed:   CBG:  Recent Labs  Lab 10/17/20 0523 10/17/20 1107  GLUCAP 164* 142*    SpO2: 95 % O2 Flow Rate (L/min): 3 L/min    Vitals:   10/17/20 0435 10/17/20 0506 10/17/20 0736 10/17/20 1106  BP: (!) 191/78 (!) 203/64 (!) 161/59 (!) 166/62  Pulse: 89 84 80 72  Resp: 20 19 20 16   Temp: 98.4 F (36.9 C) 97.6 F (36.4 C) 98.2 F (36.8 C)  98.1 F (36.7 C)  TempSrc: Oral Oral Oral Oral  SpO2: 95% 98% 100% 95%  Weight:  77.3 kg    Height:  5\' 10"  (1.778 m)       Intake/Output Summary (Last 24 hours) at 10/17/2020 1142 Last data filed at 10/17/2020 0925 Gross per 24 hour  Intake 480 ml  Output 5004 ml  Net -4524 ml    08/06 1901 - 08/08 0700 In: 240 [P.O.:240] Out: 5004   Filed Weights   10/16/20 2020 10/17/20 0506  Weight: 80 kg 77.3 kg    CBC:  Recent Labs  Lab 10/16/20 2016 10/16/20 2047  WBC 9.2  --   HGB 10.8* 11.6*  10.9*  HCT 33.5* 34.0*  32.0*  PLT 149*  --   MCV 97.4  --   MCH 31.4  --   MCHC 32.2  --   RDW 15.9*  --   LYMPHSABS 1.2  --   MONOABS 0.4  --   EOSABS 0.1  --   BASOSABS 0.0  --     Complete metabolic panel:  Recent Labs  Lab 10/16/20 2016 10/16/20 2034 10/16/20 2047  NA 141  --  139  138  K 4.3  --  4.2  4.2  CL 96*  --  99  CO2 26  --   --   GLUCOSE 123*  --  120*  BUN 45*  --  40*  CREATININE 10.56*  --  10.70*  CALCIUM 9.7  --   --  AST 17  --   --   ALT 13  --   --   ALKPHOS 57  --   --   BILITOT 0.9  --   --   ALBUMIN 3.8  --   --   BNP  --  1,084.5*  --     No results for input(s): LIPASE, AMYLASE in the last 168 hours.  Recent Labs  Lab 10/16/20 2034 10/16/20 2036  BNP 1,084.5*  --   SARSCOV2NAA  --  NEGATIVE    ------------------------------------------------------------------------------------------------------------------ No results for input(s): CHOL, HDL, LDLCALC, TRIG, CHOLHDL, LDLDIRECT in the last 72 hours.  Lab Results  Component Value Date   HGBA1C 5.0 10/16/2018   ------------------------------------------------------------------------------------------------------------------ No results for input(s): TSH, T4TOTAL, T3FREE, THYROIDAB in the last 72 hours.  Invalid input(s): FREET3 ------------------------------------------------------------------------------------------------------------------ No results for input(s):  VITAMINB12, FOLATE, FERRITIN, TIBC, IRON, RETICCTPCT in the last 72 hours.  Coagulation profile No results for input(s): INR, PROTIME in the last 168 hours. No results for input(s): DDIMER in the last 72 hours.  Cardiac Enzymes No results for input(s): CKTOTAL, CKMB, CKMBINDEX, TROPONINI in the last 168 hours.  ------------------------------------------------------------------------------------------------------------------    Component Value Date/Time   BNP 1,084.5 (H) 10/16/2020 2034   BNP 190.5 (H) 09/24/2014 1224     Antibiotics: Anti-infectives (From admission, onward)    None        Radiology Reports  DG Chest Port 1 View  Result Date: 10/16/2020 CLINICAL DATA:  Shortness of breath EXAM: PORTABLE CHEST 1 VIEW COMPARISON:  05/11/2019 FINDINGS: Cardiomegaly. Prior aortic valve repair. Diffuse interstitial prominence throughout the lungs, favor edema. Small bilateral effusions. No acute bony abnormality. IMPRESSION: Cardiomegaly. Diffuse interstitial prominence throughout the lungs, favor edema/CHF. Small effusions. Electronically Signed   By: Rolm Baptise M.D.   On: 10/16/2020 20:27      DVT prophylaxis: Heparin  Code Status: Full code  Family Communication: No family at bedside   Consultants: Nephrology  Procedures:     Objective    Physical Examination:   General-appears in no acute distress Heart-S1-S2, regular, no murmur auscultated Lungs-clear to auscultation bilaterally, no wheezing or crackles auscultated Abdomen-soft, nontender, no organomegaly Extremities-s/p right BKA Neuro-alert, oriented x3, no focal deficit noted  Status is: Inpatient  Dispo: The patient is from: Home              Anticipated d/c is to: Home              Anticipated d/c date is: 10/19/2020              Patient currently not stable for discharge  Barrier to discharge-ongoing dialysis  COVID-19 Labs  No results for input(s): DDIMER, FERRITIN, LDH, CRP in the last  72 hours.  Lab Results  Component Value Date   [place] NEGATIVE 10/16/2020   [place] NEGATIVE 05/04/2019   [place] NEGATIVE 04/25/2019   [place] NEGATIVE 04/08/2019    Microbiology  Recent Results (from the past 240 hour(s))  Resp Panel by RT-PCR (Flu A&B, Covid) Nasopharyngeal Swab     Status: None   Collection Time: 10/16/20  8:36 PM   Specimen: Nasopharyngeal Swab; Nasopharyngeal(NP) swabs in vial transport medium  Result Value Ref Range Status   SARS Coronavirus 2 by RT PCR NEGATIVE NEGATIVE Final    Comment: (NOTE) SARS-CoV-2 target nucleic acids are NOT DETECTED.  The SARS-CoV-2 RNA is generally detectable in upper respiratory specimens during the acute phase of infection. The lowest concentration of SARS-CoV-2 viral copies this assay  can detect is 138 copies/mL. A negative result does not preclude SARS-Cov-2 infection and should not be used as the sole basis for treatment or other patient management decisions. A negative result may occur with  improper specimen collection/handling, submission of specimen other than nasopharyngeal swab, presence of viral mutation(s) within the areas targeted by this assay, and inadequate number of viral copies(<138 copies/mL). A negative result must be combined with clinical observations, patient history, and epidemiological information. The expected result is Negative.  Fact Sheet for Patients:  EntrepreneurPulse.com.au  Fact Sheet for Healthcare Providers:  IncredibleEmployment.be  This test is no t yet approved or cleared by the [place] FDA and  has been authorized for detection and/or diagnosis of SARS-CoV-2 by FDA under an Emergency Use Authorization (EUA). This EUA will remain  in effect (meaning this test can be used) for the duration of the COVID-19 declaration under Section 564(b)(1) of the Act, 21 U.S.C.section 360bbb-3(b)(1), unless the authorization is terminated   or revoked sooner.       Influenza A by PCR NEGATIVE NEGATIVE Final   Influenza B by PCR NEGATIVE NEGATIVE Final    Comment: (NOTE) The Xpert Xpress SARS-CoV-2/FLU/RSV plus assay is intended as an aid in the diagnosis of influenza from Nasopharyngeal swab specimens and should not be used as a sole basis for treatment. Nasal washings and aspirates are unacceptable for Xpert Xpress SARS-CoV-2/FLU/RSV testing.  Fact Sheet for Patients: EntrepreneurPulse.com.au  Fact Sheet for Healthcare Providers: IncredibleEmployment.be  This test is not yet approved or cleared by the [place] FDA and has been authorized for detection and/or diagnosis of SARS-CoV-2 by FDA under an Emergency Use Authorization (EUA). This EUA will remain in effect (meaning this test can be used) for the duration of the COVID-19 declaration under Section 564(b)(1) of the Act, 21 U.S.C. section 360bbb-3(b)(1), unless the authorization is terminated or revoked.  Performed at [facility], [place] [address] [place], [place] [address]     Pressure Injury 05/13/19 Buttocks Right Stage 2 -  Partial thickness loss of dermis presenting as a shallow open injury with a red, pink wound bed without slough. (Active)  05/13/19 0259  Location: Buttocks  Location Orientation: Right  Staging: Stage 2 -  Partial thickness loss of dermis presenting as a shallow open injury with a red, pink wound bed without slough.  Wound Description (Comments):   Present on Admission: No          [facility] If 7PM-7AM, please contact night-coverage at [email or web address], Office  [phone]   10/17/2020, 11:42 AM  LOS: 1 day

## 2020-10-18 ENCOUNTER — Other Ambulatory Visit (HOSPITAL_COMMUNITY): Payer: Self-pay

## 2020-10-18 DIAGNOSIS — N186 End stage renal disease: Secondary | ICD-10-CM | POA: Diagnosis not present

## 2020-10-18 DIAGNOSIS — J810 Acute pulmonary edema: Secondary | ICD-10-CM | POA: Diagnosis not present

## 2020-10-18 DIAGNOSIS — I5043 Acute on chronic combined systolic (congestive) and diastolic (congestive) heart failure: Secondary | ICD-10-CM | POA: Diagnosis not present

## 2020-10-18 DIAGNOSIS — J9601 Acute respiratory failure with hypoxia: Secondary | ICD-10-CM | POA: Diagnosis not present

## 2020-10-18 LAB — BASIC METABOLIC PANEL
Anion gap: 14 (ref 5–15)
BUN: 50 mg/dL — ABNORMAL HIGH (ref 8–23)
CO2: 27 mmol/L (ref 22–32)
Calcium: 10.3 mg/dL (ref 8.9–10.3)
Chloride: 94 mmol/L — ABNORMAL LOW (ref 98–111)
Creatinine, Ser: 8.4 mg/dL — ABNORMAL HIGH (ref 0.61–1.24)
GFR, Estimated: 6 mL/min — ABNORMAL LOW (ref >60)
Glucose, Bld: 120 mg/dL — ABNORMAL HIGH (ref 70–99)
Potassium: 4.9 mmol/L (ref 3.5–5.1)
Sodium: 135 mmol/L (ref 135–145)

## 2020-10-18 MED ORDER — PENTAFLUOROPROP-TETRAFLUOROETH EX AERO: 1.0000 "application " | INHALATION_SPRAY | CUTANEOUS | Status: DC | PRN

## 2020-10-18 MED ORDER — SODIUM CHLORIDE 0.9 % IV SOLN: 100.0000 mL | INTRAVENOUS | Status: DC | PRN

## 2020-10-18 MED ORDER — LIDOCAINE HCL (PF) 1 % IJ SOLN
5.0000 mL | INTRAMUSCULAR | Status: DC | PRN
Start: 2020-10-18 — End: 2020-10-18

## 2020-10-18 MED ORDER — HEPARIN SODIUM (PORCINE) 1000 UNIT/ML DIALYSIS
2400.0000 [IU] | Freq: Once | INTRAMUSCULAR | Status: DC
Start: 2020-10-19 — End: 2020-10-18

## 2020-10-18 MED ORDER — ALTEPLASE 2 MG IJ SOLR: 2.0000 mg | Freq: Once | INTRAMUSCULAR | Status: DC | PRN

## 2020-10-18 MED ORDER — HEPARIN SODIUM (PORCINE) 1000 UNIT/ML DIALYSIS: 1000.0000 [IU] | INTRAMUSCULAR | Status: DC | PRN

## 2020-10-18 MED ORDER — HEPARIN SODIUM (PORCINE) 1000 UNIT/ML DIALYSIS: 20.0000 [IU]/kg | INTRAMUSCULAR | Status: DC | PRN

## 2020-10-18 MED ORDER — AMLODIPINE BESYLATE 10 MG PO TABS
10.0000 mg | ORAL_TABLET | Freq: Every evening | ORAL | 3 refills | Status: AC
  Filled 2020-10-18: qty 30, 30d supply, fill #0

## 2020-10-18 MED ORDER — LIDOCAINE-PRILOCAINE 2.5-2.5 % EX CREA: 1.0000 "application " | TOPICAL_CREAM | CUTANEOUS | Status: DC | PRN

## 2020-10-18 NOTE — Plan of Care (Signed)

## 2020-10-18 NOTE — Progress Notes (Addendum)
[facility] Progress Note  Subjective: seen on HD no c/o  Vitals:   10/18/20 0900 10/18/20 0927 10/18/20 1000 10/18/20 1026  BP: (!) 146/67 137/60 (!) 126/59 (!) 128/58  Pulse: 67 69 71 70  Resp:      Temp:      TempSrc:      SpO2:      Weight:      Height:        Exam: General appearance: alert, nad Resp: clear bilat  Cardio: systolic murmur: holosystolic 3/6, harsh throughout the precordium GI: soft, non-tender; bowel sounds normal; no masses,  no organomegaly Extremities: 1+ pedal/ ankle edema on L leg, BKA on the R no edema, no other edema RFA AVF+bruit    OP HD: MWF [facility]  4h  79kg  2/2 bath  Hep 24000  AVF  P4  BFR 400  Mircera 50 q 2 weeks Venofer 50 q week Calcitriol 1.5 mcg tiw  Assessment/ Plan: SOB/DOE-  consistent with volume overload and pulmonary edema.  Pt may have lost body weight. 2kg under dry today after 5 L UF overnight. He has been cutting treatments short. Breathing much better.  F/u CXR showed resolution of edema 8/08. Short HD today to lower volume further, pt felt bad on HD today presumably due to pulling too much fluid. Post HD bed wt today is 76kg. Perhaps new dry wt of 76- 77kg would be appropriate, since he was admitted w/ pulm edema at 80kg.  OK for dc from renal standpoint.    ESRD: normally HD MWF via [facility]F at [facility]. Short HD today off sched as above.  Hypertension: only on norvasc 5 as OP Anemia of ESRD: Hb 10-12 here, no esa or iron needed Metabolic Bone Disease: will continue his OP calcitriol and also appears to be on fosrenol for phos binding      [name] Syanna Remmert 10/18/2020, 10:33 AM   Recent Labs  Lab 10/16/20 2016 10/16/20 2047 10/18/20 0211  K 4.3 4.2  4.2 4.9  BUN 45* 40* 50*  CREATININE 10.56* 10.70* 8.40*  CALCIUM 9.7  --  10.3  HGB 10.8* 11.6*  10.9*  --     Inpatient medications:  amiodarone  200 mg Oral Daily   atorvastatin  80 mg Oral Daily   calcitRIOL  1.5 mcg Oral Q M,W,F-HD   Chlorhexidine Gluconate  Cloth  6 each Topical Q0600   clopidogrel  75 mg Oral Daily   [START ON 10/19/2020] heparin  2,400 Units Dialysis Once in dialysis   heparin injection (subcutaneous)  5,000 Units Subcutaneous Q8H    sodium chloride     sodium chloride     sodium chloride, sodium chloride, alteplase, heparin, heparin, hydrALAZINE, labetalol, lidocaine (PF), lidocaine-prilocaine, pentafluoroprop-tetrafluoroeth

## 2020-10-18 NOTE — Progress Notes (Signed)
Heart Failure Navigator Progress Note  Assessed for Heart & Vascular TOC clinic readiness.  Unfortunately at this time the patient does not meet criteria due to ESRD on HD.   Navigator available for reassessment of patient.   Kerby Nora, PharmD, BCPS Heart Failure Stewardship Pharmacist Phone [phone]

## 2020-10-18 NOTE — Discharge Summary (Signed)
Physician Discharge Summary  Jose Garrett OXB:[id number] DOB: [birth year] DOA: 10/16/2020  PCP: [name], NP  Admit date: 10/16/2020 Discharge date: 10/18/2020  Time spent: 60 minutes  Recommendations for Outpatient Follow-up:  Follow-up PCP in 2 weeks Follow-up nephrology as outpatient   Discharge Diagnoses:  Principal Problem:   Volume overload Active Problems:   ESRD (end stage renal disease) on dialysis [facility])   Acute on chronic combined systolic and diastolic CHF (congestive heart failure) ([place])   Acute hypoxemic respiratory failure ([place])   Hypertensive urgency   Discharge Condition: Stable  Diet recommendation: Heart healthy diet  Filed Weights   10/18/20 0345 10/18/20 0830 10/18/20 1100  Weight: 78.2 kg 77.5 kg 75.9 kg    History of present illness:  70 year old male with a history of ESRD on HD, MWF, chronic combined systolic and diastolic CHF, severe aortic stenosis s/p TAVR, history of NSVT post TAVR on amiodarone, CAD, PVD s/p right BKA, anemia, hypertension presents to ED for evaluation of respiratory distress.  Patient was found to be hypotensive with BP 220/114.  O2 sats was 68% on room air per EMS with bilateral coarse crackles, placed on NRB and O2 sats improved to 100%.  Chest x-ray showed pulmonary edema.  Nephrology was consulted for hemodialysis.    Hospital Course:   Acute hypoxemic respiratory failure -Secondary to pulmonary edema in setting of ESRD, chronic combined CHF -Improved after hemodialysis done yesterday -Still requiring 3 L/min of oxygen -Wean off O2 as tolerated   Hypertensive urgency -Secondary to volume overload -Blood pressure has significantly improved after hemodialysis -We will increase the dose of amlodipine to 10 mg daily   Troponin elevation -Troponin was elevated at 77 -Repeat troponin 73, 74 -BNP evaded 1084, likely demand ischemia from volume overload -Patient not endorsing any chest pain   History of nonsustained V.  Tach -Continue amiodarone   History of CAD -Continue Plavix, Lipitor  Procedures:   Consultations: Nephrology  Discharge Exam: Vitals:   10/18/20 1100 10/18/20 1303  BP: (!) 131/55 (!) 159/66  Pulse: 70 72  Resp: (!) 22 19  Temp: 98 F (36.7 C) 98.5 F (36.9 C)  SpO2: 95% 97%    General: Appears in no acute distress Cardiovascular: S1-S2, regular, no murmur auscultated Respiratory: Clear to auscultation bilaterally  Discharge Instructions   Discharge Instructions     Diet - low sodium heart healthy   Complete by: As directed    Increase activity slowly   Complete by: As directed       Allergies as of 10/18/2020   No Known Allergies      Medication List     TAKE these medications    acetaminophen 325 MG tablet Commonly known as: TYLENOL Take 1-2 tablets (325-650 mg total) by mouth every 4 (four) hours as needed for mild pain (or temp >/= 101 F).   amiodarone 200 MG tablet Commonly known as: PACERONE TAKE 1 TABLET (200 MG TOTAL) BY MOUTH DAILY.   amLODipine 10 MG tablet Commonly known as: NORVASC Take 1 tablet (10 mg total) by mouth at bedtime. What changed:  medication strength how much to take   atorvastatin 80 MG tablet Commonly known as: LIPITOR TAKE 1 TABLET BY MOUTH DAILY WITH SUPPER   clopidogrel 75 MG tablet Commonly known as: PLAVIX TAKE 1 TABLET BY MOUTH DAILY WITH SUPPER   Darbepoetin Alfa 100 MCG/0.5ML Sosy injection Commonly known as: ARANESP Inject 0.5 mLs (100 mcg total) into the vein every Friday with hemodialysis.  docusate sodium 100 MG capsule Commonly known as: COLACE Take 1 capsule (100 mg total) by mouth 2 (two) times daily.   iron sucrose in sodium chloride 0.9 % 100 mL Iron Sucrose (Venofer)   lanthanum 1000 MG chewable tablet Commonly known as: FOSRENOL Chew 2,000 mg by mouth 3 (three) times daily.   LORazepam 1 MG tablet Commonly known as: ATIVAN Take 1 tablet (1 mg total) by mouth 2 (two) times daily as  needed for anxiety.   MIRCERA IJ Mircera   multivitamin Tabs tablet Take 1 tablet by mouth at bedtime.   ondansetron 4 MG tablet Commonly known as: ZOFRAN TAKE 1 TABLET BY MOUTH TWICE A DAY AS NEEDED What changed:  how much to take reasons to take this   oxyCODONE 10 mg 12 hr tablet Commonly known as: OXYCONTIN Take 10 mg by mouth at bedtime. What changed:  Another medication with the same name was changed. Make sure you understand how and when to take each. Another medication with the same name was removed. Continue taking this medication, and follow the directions you see here.   Oxycodone HCl 10 MG Tabs TAKE 1 TABLET BY MOUTH 4 TIMES A DAY AS NEEDED. What changed:  how much to take how to take this when to take this reasons to take this Another medication with the same name was removed. Continue taking this medication, and follow the directions you see here.   pantoprazole 40 MG tablet Commonly known as: PROTONIX Take 1 tablet (40 mg total) by mouth daily.   vitamin B-12 1000 MCG tablet Commonly known as: CYANOCOBALAMIN Take 1 tablet (1,000 mcg total) by mouth every evening.       No Known Allergies    The results of significant diagnostics from this hospitalization (including imaging, microbiology, ancillary and laboratory) are listed below for reference.    Significant Diagnostic Studies: DG CHEST PORT 1 VIEW  Result Date: 10/17/2020 CLINICAL DATA:  Follow-up pulmonary edema. EXAM: PORTABLE CHEST 1 VIEW COMPARISON:  Chest radiograph 10/16/2020 and earlier FINDINGS: Stable cardiomediastinal silhouette. TAVR. Aortic calcifications. Interval improvement of diffuse hazy interstitial thickening compared to yesterday's radiograph. Persistent subtle retrocardiac hazy opacity. No pleural effusion or pneumothorax. Visualized upper abdomen is unremarkable. IMPRESSION: 1. Interval improvement of pulmonary edema. 2. Persistent hazy retrocardiac airspace opacity is favored  to represent atelectasis, though infection could have a similar appearance. Electronically Signed   By: Ileana Roup MD   On: 10/17/2020 15:56   DG Chest Port 1 View  Result Date: 10/16/2020 CLINICAL DATA:  Shortness of breath EXAM: PORTABLE CHEST 1 VIEW COMPARISON:  05/11/2019 FINDINGS: Cardiomegaly. Prior aortic valve repair. Diffuse interstitial prominence throughout the lungs, favor edema. Small bilateral effusions. No acute bony abnormality. IMPRESSION: Cardiomegaly. Diffuse interstitial prominence throughout the lungs, favor edema/CHF. Small effusions. Electronically Signed   By: Rolm Baptise M.D.   On: 10/16/2020 20:27    Microbiology: Recent Results (from the past 240 hour(s))  Resp Panel by RT-PCR (Flu A&B, Covid) Nasopharyngeal Swab     Status: None   Collection Time: 10/16/20  8:36 PM   Specimen: Nasopharyngeal Swab; Nasopharyngeal(NP) swabs in vial transport medium  Result Value Ref Range Status   SARS Coronavirus 2 by RT PCR NEGATIVE NEGATIVE Final    Comment: (NOTE) SARS-CoV-2 target nucleic acids are NOT DETECTED.  The SARS-CoV-2 RNA is generally detectable in upper respiratory specimens during the acute phase of infection. The lowest concentration of SARS-CoV-2 viral copies this assay can detect is 138 copies/mL.  A negative result does not preclude SARS-Cov-2 infection and should not be used as the sole basis for treatment or other patient management decisions. A negative result may occur with  improper specimen collection/handling, submission of specimen other than nasopharyngeal swab, presence of viral mutation(s) within the areas targeted by this assay, and inadequate number of viral copies(<138 copies/mL). A negative result must be combined with clinical observations, patient history, and epidemiological information. The expected result is Negative.  Fact Sheet for Patients:  EntrepreneurPulse.com.au  Fact Sheet for Healthcare Providers:   IncredibleEmployment.be  This test is no t yet approved or cleared by the [place] FDA and  has been authorized for detection and/or diagnosis of SARS-CoV-2 by FDA under an Emergency Use Authorization (EUA). This EUA will remain  in effect (meaning this test can be used) for the duration of the COVID-19 declaration under Section 564(b)(1) of the Act, 21 U.S.C.section 360bbb-3(b)(1), unless the authorization is terminated  or revoked sooner.       Influenza A by PCR NEGATIVE NEGATIVE Final   Influenza B by PCR NEGATIVE NEGATIVE Final    Comment: (NOTE) The Xpert Xpress SARS-CoV-2/FLU/RSV plus assay is intended as an aid in the diagnosis of influenza from Nasopharyngeal swab specimens and should not be used as a sole basis for treatment. Nasal washings and aspirates are unacceptable for Xpert Xpress SARS-CoV-2/FLU/RSV testing.  Fact Sheet for Patients: EntrepreneurPulse.com.au  Fact Sheet for Healthcare Providers: IncredibleEmployment.be  This test is not yet approved or cleared by the [place] FDA and has been authorized for detection and/or diagnosis of SARS-CoV-2 by FDA under an Emergency Use Authorization (EUA). This EUA will remain in effect (meaning this test can be used) for the duration of the COVID-19 declaration under Section 564(b)(1) of the Act, 21 U.S.C. section 360bbb-3(b)(1), unless the authorization is terminated or revoked.  Performed at [facility], [place] [address], [place], [place] [address]      Labs: Basic Metabolic Panel: Recent Labs  Lab 10/16/20 2016 10/16/20 2047 10/18/20 0211  NA 141 139  138 135  K 4.3 4.2  4.2 4.9  CL 96* 99 94*  CO2 26  --  27  GLUCOSE 123* 120* 120*  BUN 45* 40* 50*  CREATININE 10.56* 10.70* 8.40*  CALCIUM 9.7  --  10.3   Liver Function Tests: Recent Labs  Lab 10/16/20 2016  AST 17  ALT 13  ALKPHOS 57  BILITOT 0.9  PROT 7.1  ALBUMIN 3.8    No results for input(s): LIPASE, AMYLASE in the last 168 hours. No results for input(s): AMMONIA in the last 168 hours. CBC: Recent Labs  Lab 10/16/20 2016 10/16/20 2047  WBC 9.2  --   NEUTROABS 7.4  --   HGB 10.8* 11.6*  10.9*  HCT 33.5* 34.0*  32.0*  MCV 97.4  --   PLT 149*  --    Cardiac Enzymes: No results for input(s): CKTOTAL, CKMB, CKMBINDEX, TROPONINI in the last 168 hours. BNP: BNP (last 3 results) Recent Labs    10/16/20 2034  BNP 1,084.5*    ProBNP (last 3 results) No results for input(s): PROBNP in the last 8760 hours.  CBG: Recent Labs  Lab 10/17/20 0523 10/17/20 1107 10/17/20 1623  GLUCAP 164* 142* 124*       Signed:  Oswald Hillock MD.  [facility] 10/18/2020, 2:04 PM

## 2020-10-19 ENCOUNTER — Other Ambulatory Visit (HOSPITAL_COMMUNITY): Payer: Self-pay

## 2020-10-19 MED ORDER — RENA-VITE RX 1 MG PO TABS
1.0000 | ORAL_TABLET | Freq: Every day | ORAL | 3 refills | Status: AC
  Filled 2020-10-19: qty 90, 90d supply, fill #0
  Filled 2020-10-21 (×2): qty 100, 100d supply, fill #0

## 2020-10-19 MED ORDER — CLOPIDOGREL BISULFATE 75 MG PO TABS
75.0000 mg | ORAL_TABLET | Freq: Every day | ORAL | 0 refills | Status: DC
  Filled 2020-10-19: qty 30, 30d supply, fill #0

## 2020-10-19 NOTE — TOC Transition Note (Signed)
Transition of care contact from inpatient facility  Date of discharge: 10/18/2020 Date of contact: 10/19/20 Method: Phone Spoke to: Patient  Patient contacted to discuss transition of care from recent inpatient hospitalization. Patient was admitted to [facility] from 8/7-10/18/2020 for evaluation of respiratory distress and HTN urgency.  Medication changes were reviewed. He requested re-fills for the following medications: Plavix and Rena-Vite-Rx sent to his local pharmacy.  Patient will follow up with his outpatient HD unit on: Friday 10/21/20 at [facility].  Tobie Poet, NP

## 2020-10-20 ENCOUNTER — Encounter (HOSPITAL_COMMUNITY): Payer: Self-pay | Admitting: *Deleted

## 2020-10-20 ENCOUNTER — Other Ambulatory Visit: Payer: Self-pay | Admitting: Orthopedic Surgery

## 2020-10-20 ENCOUNTER — Other Ambulatory Visit (HOSPITAL_COMMUNITY): Payer: Self-pay

## 2020-10-20 ENCOUNTER — Encounter: Payer: Self-pay | Admitting: Hematology

## 2020-10-21 ENCOUNTER — Encounter: Payer: Self-pay | Admitting: Hematology

## 2020-10-21 ENCOUNTER — Other Ambulatory Visit: Payer: Self-pay

## 2020-10-21 ENCOUNTER — Encounter (HOSPITAL_COMMUNITY): Payer: Self-pay | Admitting: *Deleted

## 2020-10-21 ENCOUNTER — Other Ambulatory Visit (HOSPITAL_COMMUNITY): Payer: Self-pay

## 2020-10-21 DIAGNOSIS — I739 Peripheral vascular disease, unspecified: Secondary | ICD-10-CM

## 2020-10-24 NOTE — Pre-Procedure Instructions (Signed)
Surgical Instructions    Your procedure is scheduled on Wednesday, August 24th.  Report to [facility] Main Entrance "A" at 11:30 A.M., then check in with the Admitting office.  Call this number if you have problems the morning of surgery:  [phone]   If you have any questions prior to your surgery date call [phone]: Open Monday-Friday 8am-4pm    Remember:  Do not eat after midnight the night before your surgery  You may drink clear liquids until 10:30 a.m. the morning of your surgery.   Clear liquids allowed are: Water, Non-Citrus Juices (without pulp), Carbonated Beverages, Clear Tea, Black Coffee Only, and Gatorade.   Patient Instructions  The day of surgery (if you do NOT have diabetes):  Drink ONE (1) Pre-Surgery Clear Ensure by 10:30 am the morning of surgery   This drink was given to you during your hospital  pre-op appointment visit. Nothing else to drink after completing the  Pre-Surgery Clear Ensure.    Take these medicines the morning of surgery with A SIP OF WATER  amiodarone (PACERONE)  pantoprazole (PROTONIX)    Take these medications as needed: acetaminophen (TYLENOL)  LORazepam (ATIVAN)  ondansetron (ZOFRAN)  oxyCODONE (OXYCONTIN) Oxycodone HCl   Follow your surgeon's instructions on when to stop clopidogrel (PLAVIX).  If no instructions were given by your surgeon then you will need to call the office to get those instructions.    As of today, STOP taking any Aspirin (unless otherwise instructed by your surgeon) Aleve, Naproxen, Ibuprofen, Motrin, Advil, Goody's, BC's, all herbal medications, fish oil, and all vitamins.                     Do NOT Smoke (Tobacco/Vaping) or drink Alcohol 24 hours prior to your procedure.  If you use a CPAP at night, you may bring all equipment for your overnight stay.   Contacts, glasses, piercing's, hearing aid's, dentures or partials may not be worn into surgery, please bring cases for these belongings.    For  patients admitted to the hospital, discharge time will be determined by your treatment team.   Patients discharged the day of surgery will not be allowed to drive home, and someone needs to stay with them for 24 hours.  ONLY 1 SUPPORT PERSON MAY BE PRESENT WHILE YOU ARE IN SURGERY. IF YOU ARE TO BE ADMITTED ONCE YOU ARE IN YOUR ROOM YOU WILL BE ALLOWED TWO (2) VISITORS.  Minor children may have two parents present. Special consideration for safety and communication needs will be reviewed on a case by case basis.   Special instructions:   [place]- Preparing For Surgery  Before surgery, you can play an important role. Because skin is not sterile, your skin needs to be as free of germs as possible. You can reduce the number of germs on your skin by washing with CHG (chlorahexidine gluconate) Soap before surgery.  CHG is an antiseptic cleaner which kills germs and bonds with the skin to continue killing germs even after washing.    Oral Hygiene is also important to reduce your risk of infection.  Remember - BRUSH YOUR TEETH THE MORNING OF SURGERY WITH YOUR REGULAR TOOTHPASTE  Please do not use if you have an allergy to CHG or antibacterial soaps. If your skin becomes reddened/irritated stop using the CHG.  Do not shave (including legs and underarms) for at least 48 hours prior to first CHG shower. It is OK to shave your face.  Please follow  these instructions carefully.   Shower the NIGHT BEFORE SURGERY and the MORNING OF SURGERY  If you chose to wash your hair, wash your hair first as usual with your normal shampoo.  After you shampoo, rinse your hair and body thoroughly to remove the shampoo.  Use CHG Soap as you would any other liquid soap. You can apply CHG directly to the skin and wash gently with a scrungie or a clean washcloth.   Apply the CHG Soap to your body ONLY FROM THE NECK DOWN.  Do not use on open wounds or open sores. Avoid contact with your eyes, ears, mouth and genitals  (private parts). Wash Face and genitals (private parts)  with your normal soap.   Wash thoroughly, paying special attention to the area where your surgery will be performed.  Thoroughly rinse your body with warm water from the neck down.  DO NOT shower/wash with your normal soap after using and rinsing off the CHG Soap.  Pat yourself dry with a CLEAN TOWEL.  Wear CLEAN PAJAMAS to bed the night before surgery  Place CLEAN SHEETS on your bed the night before your surgery  DO NOT SLEEP WITH PETS.   Day of Surgery: Shower with CHG soap. Do not wear jewelry. Do not wear lotions, powders, colognes, or deodorant. Men may shave face and neck. Do not bring valuables to the hospital. [facility] is not responsible for any belongings or valuables. Wear Clean/Comfortable clothing the morning of surgery Remember to brush your teeth WITH YOUR REGULAR TOOTHPASTE.   Please read over the following fact sheets that you were given.

## 2020-10-25 ENCOUNTER — Encounter (HOSPITAL_COMMUNITY): Payer: Self-pay

## 2020-10-25 ENCOUNTER — Telehealth: Payer: Self-pay | Admitting: Cardiovascular Disease

## 2020-10-25 ENCOUNTER — Emergency Department (HOSPITAL_COMMUNITY): Payer: Medicare Other

## 2020-10-25 ENCOUNTER — Encounter (HOSPITAL_COMMUNITY)
Admission: RE | Admit: 2020-10-25 | Discharge: 2020-10-25 | Disposition: A | Discharge status: Home / Self Care | Payer: Medicare Other | Source: Ambulatory Visit | Attending: Orthopedic Surgery | Admitting: Orthopedic Surgery

## 2020-10-25 ENCOUNTER — Emergency Department (HOSPITAL_COMMUNITY)
Admission: EM | Admit: 2020-10-25 | Discharge: 2020-10-26 | Disposition: A | Discharge status: Home / Self Care | Payer: Medicare Other | Attending: Emergency Medicine | Admitting: Emergency Medicine

## 2020-10-25 ENCOUNTER — Other Ambulatory Visit: Payer: Self-pay

## 2020-10-25 DIAGNOSIS — N186 End stage renal disease: Secondary | ICD-10-CM | POA: Diagnosis not present

## 2020-10-25 DIAGNOSIS — L03113 Cellulitis of right upper limb: Secondary | ICD-10-CM | POA: Diagnosis present

## 2020-10-25 DIAGNOSIS — Z8579 Personal history of other malignant neoplasms of lymphoid, hematopoietic and related tissues: Secondary | ICD-10-CM | POA: Insufficient documentation

## 2020-10-25 DIAGNOSIS — Z992 Dependence on renal dialysis: Secondary | ICD-10-CM | POA: Diagnosis not present

## 2020-10-25 DIAGNOSIS — Z79899 Other long term (current) drug therapy: Secondary | ICD-10-CM | POA: Diagnosis not present

## 2020-10-25 DIAGNOSIS — Z7902 Long term (current) use of antithrombotics/antiplatelets: Secondary | ICD-10-CM | POA: Insufficient documentation

## 2020-10-25 DIAGNOSIS — I2510 Atherosclerotic heart disease of native coronary artery without angina pectoris: Secondary | ICD-10-CM | POA: Insufficient documentation

## 2020-10-25 DIAGNOSIS — I132 Hypertensive heart and chronic kidney disease with heart failure and with stage 5 chronic kidney disease, or end stage renal disease: Secondary | ICD-10-CM | POA: Insufficient documentation

## 2020-10-25 DIAGNOSIS — I5042 Chronic combined systolic (congestive) and diastolic (congestive) heart failure: Secondary | ICD-10-CM | POA: Diagnosis not present

## 2020-10-25 DIAGNOSIS — Z87891 Personal history of nicotine dependence: Secondary | ICD-10-CM | POA: Diagnosis not present

## 2020-10-25 DIAGNOSIS — Z01812 Encounter for preprocedural laboratory examination: Secondary | ICD-10-CM | POA: Insufficient documentation

## 2020-10-25 DIAGNOSIS — L03119 Cellulitis of unspecified part of limb: Secondary | ICD-10-CM

## 2020-10-25 DIAGNOSIS — R030 Elevated blood-pressure reading, without diagnosis of hypertension: Secondary | ICD-10-CM

## 2020-10-25 HISTORY — DX: Cardiac arrhythmia, unspecified: I49.9

## 2020-10-25 LAB — CBC WITH DIFFERENTIAL/PLATELET
Abs Immature Granulocytes: 0.06 10*3/uL (ref 0.00–0.07)
Basophils Absolute: 0 10*3/uL (ref 0.0–0.1)
Basophils Relative: 0 %
Eosinophils Absolute: 0 10*3/uL (ref 0.0–0.5)
Eosinophils Relative: 0 %
HCT: 32.2 % — ABNORMAL LOW (ref 39.0–52.0)
Hemoglobin: 10.3 g/dL — ABNORMAL LOW (ref 13.0–17.0)
Immature Granulocytes: 1 %
Lymphocytes Relative: 13 %
Lymphs Abs: 1.2 10*3/uL (ref 0.7–4.0)
MCH: 30.7 pg (ref 26.0–34.0)
MCHC: 32 g/dL (ref 30.0–36.0)
MCV: 96.1 fL (ref 80.0–100.0)
Monocytes Absolute: 0.9 10*3/uL (ref 0.1–1.0)
Monocytes Relative: 10 %
Neutro Abs: 7.1 10*3/uL (ref 1.7–7.7)
Neutrophils Relative %: 76 %
Platelets: 139 10*3/uL — ABNORMAL LOW (ref 150–400)
RBC: 3.35 MIL/uL — ABNORMAL LOW (ref 4.22–5.81)
RDW: 15.8 % — ABNORMAL HIGH (ref 11.5–15.5)
WBC: 9.3 10*3/uL (ref 4.0–10.5)
nRBC: 0 % (ref 0.0–0.2)

## 2020-10-25 LAB — BASIC METABOLIC PANEL
Anion gap: 18 — ABNORMAL HIGH (ref 5–15)
BUN: 35 mg/dL — ABNORMAL HIGH (ref 8–23)
CO2: 30 mmol/L (ref 22–32)
Calcium: 9.7 mg/dL (ref 8.9–10.3)
Chloride: 91 mmol/L — ABNORMAL LOW (ref 98–111)
Creatinine, Ser: 7.34 mg/dL — ABNORMAL HIGH (ref 0.61–1.24)
GFR, Estimated: 7 mL/min — ABNORMAL LOW (ref >60)
Glucose, Bld: 100 mg/dL — ABNORMAL HIGH (ref 70–99)
Potassium: 3.8 mmol/L (ref 3.5–5.1)
Sodium: 139 mmol/L (ref 135–145)

## 2020-10-25 LAB — COMPREHENSIVE METABOLIC PANEL
ALT: 14 U/L (ref 0–44)
AST: 17 U/L (ref 15–41)
Albumin: 3.7 g/dL (ref 3.5–5.0)
Alkaline Phosphatase: 52 U/L (ref 38–126)
Anion gap: 17 — ABNORMAL HIGH (ref 5–15)
BUN: 37 mg/dL — ABNORMAL HIGH (ref 8–23)
CO2: 29 mmol/L (ref 22–32)
Calcium: 9.5 mg/dL (ref 8.9–10.3)
Chloride: 93 mmol/L — ABNORMAL LOW (ref 98–111)
Creatinine, Ser: 7.52 mg/dL — ABNORMAL HIGH (ref 0.61–1.24)
GFR, Estimated: 7 mL/min — ABNORMAL LOW (ref >60)
Glucose, Bld: 114 mg/dL — ABNORMAL HIGH (ref 70–99)
Potassium: 3.8 mmol/L (ref 3.5–5.1)
Sodium: 139 mmol/L (ref 135–145)
Total Bilirubin: 0.9 mg/dL (ref 0.3–1.2)
Total Protein: 7.1 g/dL (ref 6.5–8.1)

## 2020-10-25 LAB — CBC
HCT: 32.5 % — ABNORMAL LOW (ref 39.0–52.0)
Hemoglobin: 10.6 g/dL — ABNORMAL LOW (ref 13.0–17.0)
MCH: 31.3 pg (ref 26.0–34.0)
MCHC: 32.6 g/dL (ref 30.0–36.0)
MCV: 95.9 fL (ref 80.0–100.0)
Platelets: 137 10*3/uL — ABNORMAL LOW (ref 150–400)
RBC: 3.39 MIL/uL — ABNORMAL LOW (ref 4.22–5.81)
RDW: 15.9 % — ABNORMAL HIGH (ref 11.5–15.5)
WBC: 9.2 10*3/uL (ref 4.0–10.5)
nRBC: 0 % (ref 0.0–0.2)

## 2020-10-25 MED ORDER — VANCOMYCIN HCL 1500 MG/300ML IV SOLN
1500.0000 mg | Freq: Once | INTRAVENOUS | Status: DC
  Filled 2020-10-25: qty 300

## 2020-10-25 MED ORDER — CEPHALEXIN 500 MG PO CAPS
500.0000 mg | ORAL_CAPSULE | Freq: Three times a day (TID) | ORAL | 0 refills | Status: DC
  Filled 2020-10-25: qty 21, 7d supply, fill #0

## 2020-10-25 MED ORDER — AMLODIPINE BESYLATE 5 MG PO TABS
10.0000 mg | ORAL_TABLET | Freq: Once | ORAL | Status: AC
  Administered 2020-10-25: 10 mg via ORAL
  Filled 2020-10-25: qty 2

## 2020-10-25 MED ORDER — AMOXICILLIN-POT CLAVULANATE 500-125 MG PO TABS
1.0000 | ORAL_TABLET | Freq: Two times a day (BID) | ORAL | 0 refills | Status: AC
  Filled 2020-10-25: qty 14, 7d supply, fill #0

## 2020-10-25 MED ORDER — CEPHALEXIN 250 MG PO CAPS
500.0000 mg | ORAL_CAPSULE | Freq: Once | ORAL | Status: AC
  Administered 2020-10-25: 500 mg via ORAL
  Filled 2020-10-25: qty 2

## 2020-10-25 NOTE — Progress Notes (Signed)
Elevated BP; , Red, swollen right hand in Pre-Admission Testing  BP elevated as below. Per pt, he last took his scheduled Amlodipine last night , which he usually takes every evening. Pt denies SHOB, CP, Dyspnea during appt. No acute distres noted.  Right hand also noted to be swollen, erythremic and warm to touch. Per pt he noticed this yesterday morning when he woke up. He states he mentioned this to the social worker @ dialysis, but not to any nurses or doctors. Per pt, his hand is not as swollen as it was yesterday. Anesthesia Extender, Garen Grams, PA-C notified and pt evaluated.   10/25/20 1307 10/25/20 1322 10/25/20 1416  Vitals  Temp 98.5 F (36.9 C)  --   --   Temp Source Oral  --   --   Pulse Rate 79  --   --   Pulse Rate Source Dinamap  --   --   Resp 18  --   --   BP (!) 133/120  --  (!) 194/57  SpO2 99 %  --   --   Oxygen Therapy  O2 Device Room Air  --   --   Height and Weight  Height 5\' 10"  (1.778 m)  --   --   Weight 56.7 kg  --   --   Weight Method Actual (This weight is minus the prosthetic leg)  --   --   BMI (Calculated) 17.94  --   --   BSA (Calculated - sq m) 1.67 sq meters  --   --   Pain Assessment  Pain Scale  --  0-10  --   Pain Score  --  8  --   Pain Type  --  Chronic pain  --   Pain Location  --  Hand  --   Pain Orientation  --  Right;Left  --   Pain Descriptors / Indicators  --  Aching;Numbness  --   Patients Stated Pain Goal  --  5  --   LOC  Level of Consciousness  --  Alert  --   Orientation Level  --  Oriented X4  --     10/25/20 1420  Vitals  Temp  --   Temp Source  --   Pulse Rate  --   Pulse Rate Source  --   Resp  --   BP (!) 200/54 (manual)  SpO2  --   Oxygen Therapy  O2 Device  --   Height and Weight  Height  --   Weight  --   Weight Method  --   BMI (Calculated)  --   BSA (Calculated - sq m)  --   Pain Assessment  Pain Scale  --   Pain Score  --   Pain Type  --   Pain Location  --   Pain Orientation  --   Pain  Descriptors / Indicators  --   Patients Stated Pain Goal  --   LOC  Level of Consciousness  --   Orientation Level  --

## 2020-10-25 NOTE — Telephone Encounter (Signed)
    Patient Name: Jose Garrett  DOB: 06-Oct-1950 MRN: [id number]  Primary Cardiologist: Lauree Chandler, MD  Chart reviewed as part of pre-operative protocol coverage. This is a duplicate request. Per telephone note 09/29/20 and per [facility] recommendations, Jose Garrett would be at acceptable risk for the planned procedure without further cardiovascular testing. Per Dr. Angelena Form patient was cleared to hold plavix 5 days prior to his upcoming procedure.   I reviewed his recent hospitalization for acute respiratory failure in the setting of volume overload due to incomplete dialysis sessions 2/2 back spasms. He underwent urgent dialysis with improvement in symptoms.   Left a voicemail for patient to call back to ensure no new symptoms since clearance was provided 10/13/20 and following his recent hospitalization.    Abigail Butts, PA-C 10/25/2020, 1:50 PM

## 2020-10-25 NOTE — ED Provider Notes (Signed)
[facility] EMERGENCY DEPARTMENT Provider Note   CSN: [id number] Arrival date & time: 10/25/20  1515     History Chief Complaint  Patient presents with   Hypertension   Cellulitis Rt hand    Jose Garrett is a 70 y.o. male.  70 yo male with history as below significant for ESRD on HD MWF presents to ED 2/2 elevated blood pressure and right hand cellulitis. Hx of HTN, did not take night time meds. No chest pain or dyspnea. No palpitations. Reports right hand swelling, redness, and pain over the last 2 days. Gradually worsening. He has a small abrasion to the medial aspect of dorsum of hand but no other injuries. No animal or human bites, no IVDU. No fevers but occ chills. No nausea or emesis. Pain described as aching/pressure feeling to hand. Non radiating.   The history is provided by the patient. No language interpreter was used.  Hypertension Pertinent negatives include no chest pain, no abdominal pain, no headaches and no shortness of breath.      Past Medical History:  Diagnosis Date   Amyloidosis ([place])    Anemia of chronic disease    Anxiety    CAD (coronary artery disease)    a. cardiac cath on 11/25/17 with occlusion of the RCA which could have been his event but the RCA could not be engaged. The RCA filled distally from left to right collaterals. There were no severe stenoses in the left coronary system. Medical therapy recommended, started on plavix.   Cancer [facility])    multiple myeloma   Chronic combined systolic and diastolic CHF (congestive heart failure) ([place])    a. EF dropped to 35-40% with grade 2 DD by echo 11/2017.   DJD (degenerative joint disease), lumbar    Dysrhythmia    afib   ESRD (end stage renal disease) ([place])    ESRD- HEMO MWF- [place]   GERD (gastroesophageal reflux disease)    Heart murmur    Hyperlipidemia    Hypertension    Myocardial infarction (HCC)    Neuropathy    Peripheral vascular disease (HCC)    Pneumonia     PONV (postoperative nausea and vomiting)    Restless legs    S/P TAVR (transcatheter aortic valve replacement) 10/21/2018   26 mm Edwards Sapien 3 Ultra transcatheter heart valve placed via percutaneous right transfemoral approach    Severe aortic stenosis    severe    Patient Active Problem List   Diagnosis Date Noted   Volume overload 10/16/2020   Acute hypoxemic respiratory failure ([place]) 10/16/2020   Hypertensive urgency 10/16/2020   Pain due to onychomycosis of toenail of left foot 08/30/2020   Lumbar spondylosis 01/23/2020   Neuropathy 10/06/2019   Gait abnormality 08/06/2019   Back muscle spasm 07/09/2019   Sleep disturbance    Nausea without vomiting    Orthostatic hypotension    Acute on chronic anemia    Chronic combined systolic and diastolic congestive heart failure ([place])    ESRD on dialysis ([place])    Right below-knee amputee ([place]) 05/18/2019   Pressure injury of skin 05/14/2019   Enterococcal bacteremia 05/13/2019   Headache, unspecified 01/12/2019   Allergy, unspecified, initial encounter 12/29/2018   Polymorphic ventricular tachycardia ([place]) 10/24/2018   S/P TAVR (transcatheter aortic valve replacement) 10/21/2018   Anxiety    Coronary artery disease involving native coronary artery of native heart without angina pectoris    Thrombocytopenia (HCC)    PAD (peripheral  artery disease) ([place]) 07/22/2018   Acute on chronic combined systolic and diastolic CHF (congestive heart failure) ([place]) 11/30/2017   Hypercalcemia 06/24/2017   Encounter for removal of sutures 08/15/2016   Other mechanical complication of surgically created arteriovenous fistula, subsequent encounter 06/22/2016   Unspecified protein-calorie malnutrition ([place]) 02/06/2016   Secondary hyperparathyroidism of renal origin ([place]) 02/06/2016   Pruritus, unspecified 02/06/2016   Other specified coagulation defects ([place]) 02/06/2016   Hypokalemia 02/06/2016   Diarrhea, unspecified 73/42/8768   Complication  of vascular dialysis catheter 02/06/2016   Aftercare including intermittent dialysis ([place]) 02/06/2016   ESRD (end stage renal disease) on dialysis [facility])    Iron deficiency anemia 11/22/2015   AL amyloidosis ([place]) 09/25/2014   Anemia of chronic disease 09/25/2014   Essential hypertension, benign 11/06/2013    Past Surgical History:  Procedure Laterality Date   ABDOMINAL AORTOGRAM W/LOWER EXTREMITY Bilateral 07/16/2018   Procedure: ABDOMINAL AORTOGRAM W/LOWER EXTREMITY;  Surgeon: Wellington Hampshire, MD;  Location: [place] CV LAB;  Service: Cardiovascular;  Laterality: Bilateral;   ABDOMINAL AORTOGRAM W/LOWER EXTREMITY N/A 04/09/2019   Procedure: ABDOMINAL AORTOGRAM W/LOWER EXTREMITY - Right Lower;  Surgeon: Waynetta Sandy, MD;  Location: [place] CV LAB;  Service: Cardiovascular;  Laterality: N/A;   AMPUTATION Left 08/19/2018   Procedure: AMPUTATION LEFT TOES SECOND AND THIRD;  Surgeon: Waynetta Sandy, MD;  Location: [place];  Service: Vascular;  Laterality: Left;   AMPUTATION Right 05/07/2019   Procedure: AMPUTATION BELOW KNEE;  Surgeon: Serafina Mitchell, MD;  Location: [place];  Service: Vascular;  Laterality: Right;   AV FISTULA PLACEMENT Right 12/27/2015   Procedure: Right Arm ARTERIOVENOUS (AV) FISTULA CREATION;  Surgeon: Angelia Mould, MD;  Location: [place];  Service: Vascular;  Laterality: Right;   CARDIAC CATHETERIZATION     CARDIAC VALVE REPLACEMENT     CATARACT EXTRACTION W/ INTRAOCULAR LENS  IMPLANT, BILATERAL     COLONOSCOPY     ERCP W/ SPHICTEROTOMY     EYE SURGERY     FEMORAL-POPLITEAL BYPASS GRAFT Left 07/22/2018   Procedure: left FEMORAL TO BELOW KNEE POPLITEAL ARTERY BYPASS GREAFT using 79m removable ring propaten gore graft;  Surgeon: CWaynetta Sandy MD;  [place]  Service: Vascular;  Laterality: Left;   FEMORAL-POPLITEAL BYPASS GRAFT Right 04/10/2019   Procedure: BYPASS GRAFT RIGHT FEMORAL-POPLITEAL ARTERY;  Surgeon:  ERosetta Posner MD;  [place]  Service: Vascular;  Laterality: Right;   I & D EXTREMITY Right 05/05/2019   Procedure: Revision of  RIGHT  FOOT Amputation;  Surgeon: CWaynetta Sandy MD;  [place]  Service: Vascular;  Laterality: Right;   INTRAOPERATIVE TRANSTHORACIC ECHOCARDIOGRAM N/A 10/21/2018   Procedure: Intraoperative Transthoracic Echocardiogram;  Surgeon: CSherren Mocha MD;  Location: [place]  Service: Open Heart Surgery;  Laterality: N/A;   IR GENERIC HISTORICAL  01/27/2016   IR FLUORO GUIDE CV LINE RIGHT 01/27/2016 DArne Cleveland MD [facility]INTER[facility] RAD   IR GENERIC HISTORICAL  01/27/2016   IR [place] ACCESS RIGHT 01/27/2016 DArne Cleveland MD [facility]INTER[facility]   LAPAROSCOPIC CHOLECYSTECTOMY  03/2011   LEFT HEART CATH AND CORONARY ANGIOGRAPHY N/A 11/25/2017   Procedure: LEFT HEART CATH AND CORONARY ANGIOGRAPHY;  Surgeon: JMartinique Peter M, MD;  [place];  Service: Cardiovascular;  Laterality: N/A;   LEFT HEART CATH AND CORONARY ANGIOGRAPHY N/A 07/24/2018   Procedure: LEFT HEART CATH AND CORONARY ANGIOGRAPHY;  Surgeon: CSherren Mocha MD;  [place];  Service: Cardiovascular;  Laterality: N/A;   RENAL BIOPSY, PERCUTANEOUS Right 09/08/2014   RIGHT/LEFT HEART CATH AND CORONARY ANGIOGRAPHY N/A 03/25/2018   Procedure: RIGHT/LEFT HEART CATH AND CORONARY ANGIOGRAPHY;  Surgeon: Martinique, Peter M, MD;  Location: [place] CV LAB;  Service: Cardiovascular;  Laterality: N/A;   TEE WITHOUT CARDIOVERSION N/A 05/14/2019   Procedure: TRANSESOPHAGEAL ECHOCARDIOGRAM (TEE);  Surgeon: Fay Records, MD;  Location: [facility] ENDOSCOPY;  Service: Cardiovascular;  Laterality: N/A;   TOE AMPUTATION Left 08/19/2018   2nd & 3rd toes   TONSILLECTOMY  ~ 1960   TRANSCATHETER AORTIC VALVE REPLACEMENT, TRANSFEMORAL N/A 10/21/2018   Procedure: TRANSCATHETER AORTIC VALVE REPLACEMENT, TRANSFEMORAL;  Surgeon: Sherren Mocha, MD;  Location: [place];  Service: Open  Heart Surgery;  Laterality: N/A;   TRANSMETATARSAL AMPUTATION Right 04/29/2019   Procedure: RIGHT TRANSMETATARSAL AMPUTATION;  Surgeon: Waynetta Sandy, MD;  Location: [place];  Service: Vascular;  Laterality: Right;   ULTRASOUND GUIDANCE FOR VASCULAR ACCESS  03/25/2018   Procedure: Ultrasound Guidance For Vascular Access;  Surgeon: Martinique, Peter M, MD;  Location: [place] CV LAB;  Service: Cardiovascular;;       Family History  Problem Relation Age of Onset   Hypertension Mother    Diabetes Mother    Heart disease Mother    Skin cancer Mother    Hypertension Father    Diabetes Father    Diabetes Sister     Social History   Tobacco Use   Smoking status: Former    Packs/day: 2.00    Years: 40.00    Pack years: 80.00    Types: Cigarettes    Quit date: 03/19/2011    Years since quitting: 9.6   Smokeless tobacco: Never  Vaping Use   Vaping Use: Never used  Substance Use Topics   Alcohol use: No    Alcohol/week: 0.0 standard drinks   Drug use: No    Comment:  "I have used marijuana till the 1990's". Notes that he quit 15 yrs ago    Home Medications Prior to Admission medications   Medication Sig Start Date End Date Taking? Authorizing Provider  amoxicillin-clavulanate (AUGMENTIN) 500-125 MG tablet Take 1 tablet (500 mg total) by mouth in the morning and at bedtime for 7 days. 10/25/20 11/01/20 Yes Jeanell Sparrow, DO  acetaminophen (TYLENOL) 325 MG tablet Take 1-2 tablets (325-650 mg total) by mouth every 4 (four) hours as needed for mild pain (or temp >/= 101 F). 05/27/19   Angiulli, Lavon Paganini, PA-C  amiodarone (PACERONE) 200 MG tablet TAKE 1 TABLET (200 MG TOTAL) BY MOUTH DAILY. 09/07/20 09/07/21  Burnell Blanks, MD  amLODipine (NORVASC) 10 MG tablet Take 1 tablet (10 mg total) by mouth at bedtime. 10/18/20   Oswald Hillock, MD  atorvastatin (LIPITOR) 80 MG tablet TAKE 1 TABLET BY MOUTH DAILY WITH SUPPER 04/28/20 04/28/21  Burnell Blanks, MD  B  Complex-C-Folic Acid (RENA-VITE RX) 1 MG TABS Take 1 tablet by mouth once a day 10/19/20     clopidogrel (PLAVIX) 75 MG tablet TAKE 1 TABLET BY MOUTH DAILY WITH SUPPER 04/28/20 04/28/21  Burnell Blanks, MD  clopidogrel (PLAVIX) 75 MG tablet Take 1 tablet by mouth once a day Patient not taking: Reported on 10/24/2020 10/19/20     Darbepoetin Alfa (ARANESP) 100 MCG/0.5ML SOSY injection Inject 0.5 mLs (100 mcg total) into the vein every Friday with hemodialysis. 05/29/19   Angiulli, Lavon Paganini, PA-C  docusate sodium (COLACE) 100 MG capsule Take 1 capsule (  100 mg total) by mouth 2 (two) times daily. Patient taking differently: Take 100 mg by mouth daily as needed for moderate constipation. 05/27/19   Angiulli, Lavon Paganini, PA-C  iron sucrose in sodium chloride 0.9 % 100 mL Iron Sucrose (Venofer) 07/27/20 07/26/21  [provider]  lanthanum (FOSRENOL) 1000 MG chewable tablet Chew 2,000 mg by mouth 3 (three) times daily. 07/15/20   [provider]  LORazepam (ATIVAN) 1 MG tablet Take 1 tablet (1 mg total) by mouth 2 (two) times daily as needed for anxiety. 05/27/19   Angiulli, Lavon Paganini, PA-C  Methoxy PEG-Epoetin Beta (MIRCERA IJ) Mircera 06/08/20 06/07/21  [provider]  multivitamin (RENA-VIT) TABS tablet Take 1 tablet by mouth at bedtime. Patient not taking: Reported on 10/24/2020 08/12/18   Cathlyn Parsons, PA-C  ondansetron (ZOFRAN) 4 MG tablet TAKE 1 TABLET BY MOUTH TWICE A DAY AS NEEDED 06/28/20 06/28/21  Wellington Hampshire, MD  oxyCODONE (OXYCONTIN) 10 mg 12 hr tablet Take 10 mg by mouth 2 (two) times daily as needed (pain).    [provider]  Oxycodone HCl 10 MG TABS TAKE 1 TABLET BY MOUTH 4 TIMES A DAY AS NEEDED. 10/13/20     pantoprazole (PROTONIX) 40 MG tablet Take 1 tablet (40 mg total) by mouth daily. 05/28/19   Angiulli, Lavon Paganini, PA-C  vitamin B-12 (CYANOCOBALAMIN) 1000 MCG tablet Take 1 tablet (1,000 mcg total) by mouth every evening. 05/27/19   Angiulli, Lavon Paganini,  PA-C    Allergies    Patient has no known allergies.  Review of Systems   Review of Systems  Constitutional:  Negative for chills and fever.  HENT:  Negative for facial swelling and trouble swallowing.   Eyes:  Negative for photophobia and visual disturbance.  Respiratory:  Negative for cough and shortness of breath.   Cardiovascular:  Negative for chest pain and palpitations.  Gastrointestinal:  Negative for abdominal pain, nausea and vomiting.  Endocrine: Negative for polydipsia and polyuria.  Genitourinary:  Negative for difficulty urinating and hematuria.  Musculoskeletal:  Negative for gait problem and joint swelling.  Skin:  Positive for rash and wound. Negative for pallor.  Neurological:  Negative for syncope and headaches.  Psychiatric/Behavioral:  Negative for agitation and confusion.    Physical Exam Updated Vital Signs BP (!) 153/54   Pulse 68   Temp 98.1 F (36.7 C) (Oral)   Resp 18   Ht _0  (1.778 m)   Wt 77.1 kg   SpO2 93%   BMI 24.39 kg/m   Physical Exam Vitals and nursing note reviewed.  Constitutional:      General: He is not in acute distress.    Appearance: He is well-developed.  HENT:     Head: Normocephalic and atraumatic.     Right Ear: External ear normal.     Left Ear: External ear normal.     Mouth/Throat:     Mouth: Mucous membranes are moist.  Eyes:     General: No scleral icterus. Cardiovascular:     Rate and Rhythm: Normal rate and regular rhythm.     Pulses: Normal pulses.     Heart sounds: Normal heart sounds.  Pulmonary:     Effort: Pulmonary effort is normal. No respiratory distress.     Breath sounds: Normal breath sounds.  Abdominal:     General: Abdomen is flat.     Palpations: Abdomen is soft.     Tenderness: There is no abdominal tenderness.  Musculoskeletal:  General: Normal range of motion.       Arms:     Cervical back: Normal range of motion.     Right lower leg: No edema.     Left lower leg: No edema.      Comments: Compartments are soft to b/l UE  Skin:    General: Skin is warm and dry.     Capillary Refill: Capillary refill takes less than 2 seconds.  Neurological:     Mental Status: He is alert and oriented to person, place, and time.  Psychiatric:        Mood and Affect: Mood normal.        Behavior: Behavior normal.    ED Results / Procedures / Treatments   Labs (all labs ordered are listed, but only abnormal results are displayed) Labs Reviewed  CBC WITH DIFFERENTIAL/PLATELET - Abnormal; Notable for the following components:      Result Value   RBC 3.35 (*)    Hemoglobin 10.3 (*)    HCT 32.2 (*)    RDW 15.8 (*)    Platelets 139 (*)    All other components within normal limits  COMPREHENSIVE METABOLIC PANEL - Abnormal; Notable for the following components:   Chloride 93 (*)    Glucose, Bld 114 (*)    BUN 37 (*)    Creatinine, Ser 7.52 (*)    GFR, Estimated 7 (*)    Anion gap 17 (*)    All other components within normal limits    EKG None  Radiology DG Hand 2 View Right  Result Date: 10/25/2020 CLINICAL DATA:  Right hand cellulitis for 2-3 days. EXAM: RIGHT HAND - 2 VIEW COMPARISON:  None. FINDINGS: There is no evidence of an acute fracture or dislocation. A chronic deformity is seen involving the proximal aspect of the fourth right metacarpal. There is no evidence of arthropathy or other focal bone abnormality. Moderate severity diffuse soft tissue swelling is seen. Mild vascular calcification is also noted. Small radiopaque surgical clips are seen within the soft tissues adjacent to the volar aspect of the distal right radius. No soft tissue air is identified. IMPRESSION: 1. Diffuse soft tissue swelling which may represent diffuse cellulitis. 2. No acute osseous abnormality. Electronically Signed   By: Virgina Norfolk M.D.   On: 10/25/2020 22:50    Procedures Procedures   Medications Ordered in ED Medications  amLODipine (NORVASC) tablet 10 mg (10 mg Oral  Given 10/25/20 2228)  cephALEXin (KEFLEX) capsule 500 mg (500 mg Oral Given 10/25/20 2228)    ED Course  I have reviewed the triage vital signs and the nursing notes.  Pertinent labs & imaging results that were available during my care of the patient were reviewed by me and considered in my medical decision making (see chart for details).    MDM Rules/Calculators/A&P                           70 yo male with history as above presents to ED for HTN and hand cellulitis. Exam as above. Suspicion for cellulitis of RUE. ESRD on HD. Vitals with elevated blood pressure reading in setting of missed dose of PM anti hypertensive. Afebrile. Well appearing overall. Serious etiology considered.  Labs reviewed and are stable. WBC is 9.3. K is 3.8. Last HD was yesterday, scheduled for next HD tomorrow.  Xr reviewed of RUE, concern for cellulitis consistent with physical. Patient started on oral abx which he  is able to tolerate. Labs are re-assurring and exam is stable.  Recommend oral antibiotics at this time, trial out patient therapy. Discussed strict return precautions and close PCP f/u in next 24 hours. Pt is agreeable.  BP has improved following home oral BP meds. No CP or dyspnea.   The patient improved significantly and was discharged in stable condition. Detailed discussions were had with the patient regarding current findings, and need for close f/u with PCP or on call doctor. The patient has been instructed to return immediately if the symptoms worsen in any way for re-evaluation. Patient verbalized understanding and is in agreement with current care plan. All questions answered prior to discharge.    Final Clinical Impression(s) / ED Diagnoses Final diagnoses:  Cellulitis of hand  ESRD on hemodialysis ([place])  Elevated blood pressure reading    Rx / DC Orders ED Discharge Orders          Ordered    cephALEXin (KEFLEX) 500 MG capsule  3 times daily,   Status:  Discontinued         10/25/20 2350    amoxicillin-clavulanate (AUGMENTIN) 500-125 MG tablet  2 times daily        10/25/20 [place], [place] A, DO 10/26/20 [phone]

## 2020-10-25 NOTE — ED Triage Notes (Signed)
Pt was BIB family from Short stay d/t HTN he was scheduled for a surgery to remove a mass from his left wrist/hand. Upon arrival to ED pt is A/Ox4, he is verbal- able to make need known denies dizziness, lightheadedness or CP. C/O chronic back pain 8/10.

## 2020-10-25 NOTE — Telephone Encounter (Signed)
   [facility] Pre-operative Risk Assessment    Patient Name: Jose Garrett  DOB: January 06, 1951 MRN: [id number]  HEARTCARE STAFF:  - IMPORTANT!!!!!! Under Visit Info/Reason for Call, type in Other and utilize the format Clearance MM/DD/YY or Clearance TBD. Do not use dashes or single digits. - Please review there is not already an duplicate clearance open for this procedure. - If request is for dental extraction, please clarify the # of teeth to be extracted. - If the patient is currently at the dentist's office, call Pre-Op Callback Staff (MA/nurse) to input urgent request.  - If the patient is not currently in the dentist office, please route to the Pre-Op pool.  Request for surgical clearance:  What type of surgery is being performed? EXCISION OF LEFT WRIST DORAL VOLAR MASSES  When is this surgery scheduled? 11/02/20   What type of clearance is required (medical clearance vs. Pharmacy clearance to hold med vs. Both)? Both   Are there any medications that need to be held prior to surgery and how long? Plavix TBD   Practice name and name of physician performing surgery? Dr. Sallye Ober, [facility]   What is the office phone number? [phone]   7.   What is the office fax number?  [phone]  8.   Anesthesia type (None, local, MAC, general) ? TBD    Trilby Drummer 10/25/2020, 1:32 PM  _________________________________________________________________   (provider comments below)

## 2020-10-25 NOTE — Progress Notes (Signed)
PCP - [name], MD w/ [facility]; records requested Cardiologist - Lauree Chandler, MD Nephrologist- @ [facility] HD M,W,F  PPM/ICD - Denies  Chest x-ray - N/A EKG - 10/16/20 Stress Test - Denies ECHO - 10/22/19 Cardiac Cath - 07/24/18  Sleep Study - No CPAP - N/A  Pt is not diabetic.  Blood Thinner Instructions: Spoke w/ Caryl Pina @ Dr. Camillia Herter office to obtain instruction. IBM'd Dr. Angelena Form as well. Aspirin Instructions: N/A  ERAS Protcol - Yes PRE-SURGERY Ensure or G2- Ensure given  COVID TEST- N/A; Ambulatory   Anesthesia review: Yes, cardiac hx; recent ED visit 10/16/20  Patient denies shortness of breath, fever, cough and chest pain at PAT appointment   All instructions explained to the patient, with a verbal understanding of the material. Patient agrees to go over the instructions while at home for a better understanding. The opportunity to ask questions was provided.

## 2020-10-25 NOTE — Progress Notes (Addendum)
Anesthesia PAT Evaluation:  Case: [id number] Date/Time: 11/02/20 1315   Procedure: EXCISION OF LEFT WRIST DORAL VOLAR MASSES (Left: Wrist) - CPT 25073   Anesthesia type: General   Pre-op diagnosis: LEFT WRIST DORSAL VOLAR MASS   Location: [place] OR ROOM 03 / [place] [facility]   Surgeons: Charlotte Crumb, MD       DISCUSSION: Patient is a 70 year old male scheduled for the above procedure. Per notes, outpatient overnight admission is planned. He has multiple soft tissue calcifications in his left risk that are causing him discomfort but felt to be benign calcinosis or chronic renal failure or idiopathic tumoral calcinosis by recent MRI.   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 His daughter currently lives with him. He no longer drives, and his ex-wife [name] brought him to his PAT visit [phone]).  [place] admission 10/16/20-10/18/20 for volume overload, acute on chronic CHF, acute hypoxemic respiratory failure, hypertensive urgency. BP 220/114, O2 sat 68% on RA per EMS and placed on NRB with O2 sats improved to 100%. CXR showed pulmonary edema. Nephrology consulted for hemodialysis. Volume status and O2 sats improved with dialysis and was transitioned to 3L/[place], wean as tolerated. BP also improved after HD, although amlodipine dose increased to 10 mg.    He undergoes HD MWF via right  radiocephalic AVF. Last treatment 10/24/20, able to tolerate full session; however commonly only able to go for three hours due to hypotension.   HTN Clinic visit 10/13/20 with Marcelle Overlie, Pharm.D, CPP. By notes, nephrologist had switched him from hydralazine to amlodipine 10 mg daily. Reported BP at HD 150/70, and afterwards 120-140/70's. She wrote, "Due to his hx of significantly low BP after HD, patient's blood pressure should not be aggressively lowered. I will defer to nephrology to manage BP. Continue amlodipine 5mg  daily." Current medication list includes: amiodarone 200 mg daily, amlodipine 10 mg daily, Lipitor 80 mg Q PM, Plavix 75 mg daily, Aranesp 100 mcg Q Friday at HD, Fosrenal TID, Ativan BID as needed, Oxycontine 10 mg BID as needed, oxycodone 10 mg QID as needed, Protonix 40 mg daily.   I evaluated patient during his 10/25/20 PAT visit due to persistently elevated BP readings and right hand erythema and swelling x 2 days. BP 133/120 on arrival to PAT. He seems a little mixed up about which medications he took within the past 24 hours. He has been distracted by his back pain/spasms, phantom RLE pain, and now right hand pain and swelling. He has felt more easily fatigued since 10/18/20 hospital discharge, but denied fever, chest pain, new SOB or new edema. He stated right hand pain and redness first noticed when he woke up on 10/24/20.  Redness has worsened since then, but swelling likely stable. On exam his right hand is warm to touch, perfused. The dorsal surface of his right hand is erythematous extending up to MCP joints and to the medial volar surface. There is no drainage. 2+ hand swelling. He is unable to  close his right hand to a fist. His RUE AVF does not appear to be involved, + thrill. He did not have a provider evaluate his hand at HD on 10/24/20. He is unaware of any specific trauma or insect bite to his hand. In regards to his HTN, amlodipine is his primary antihypertensive. He believes  he took his daily amlodipine last on 10/24/20 PM and had not taken his 10/25/20 AM amiodarone.  Manual BP reach check by NT 200/54. I also took a manual BP LUE and got 208/68.   Multiple SBP > 200 while in PAT and is not due to his antihypertensive for several more hours. He was recently discharged for hypertensive urgency and volume overload. He does not appear in any acute distress, but given his persistently elevated BP readings and concern for right hand cellulitis (in setting TAVR), I have recommended further evaluation and management in the ED. Anesthesiologist Norton Blizzard, MD is in agreement. Patient transported in wheelchair to ED. I assisted in getting him checked in and reported off to the ED triage RN and PA. I will plan to follow-up ED events and update Dr. Burney Gauze. Cardiology is also attempting to reach out to [name] for preoperative cardiac risk assessment.    ADDENDUM 10/26/20 9:55 AM: 10/25/20 ED visit reviewed. Right hand xray showed diffuse soft tissue swelling without acute osseous abnormality. He was started on Keflex and Augmentin. BP improved after he was given his evening dose of amlodipine, 153/54 at discharge. I have updated Rhonda at Dr. Bertis Ruddy office. Cardiology notes indicate he may hold Plavix for 5 days--PAT RN left a voice message for him. Updated cardiology input is still pending.     ADDENDUM 11/01/20 9:47 AM: I called patient to remind him to contact [facility] so they can update clearance. He feels cardiopulmonary status is stable. He says SBP was been ~ 160's. He was being considered for partial HD treatment today, but his ride cancelled this morning. He will contact his HD center as well. He will need an ISTAT8 on arrival.  ADDENDUM 11/01/20 12:37 PM: Updated preoperative cardiology risk assessment from today by Melina Copa, PA-C reviewed. She noted August admission and ED visit. Due to post-HD hypotension, wanted to avoid aggressively lowing BP. She wrote, "I  reached out to Dr. Angelena Form for input on recent hemodynamic events who feels that patient may proceed with procedure as planned from cardiac standpoint without further cardiac testing. At baseline he is at increased risk due to his comorbidities but Dr. Angelena Form does not feel that any specific additional cardiac testing is required pre-operatively for this type of procedure." He told me his last Plavix was on 10/30/20 AM.     VS: BP (!) 200/54 Comment: manual  Pulse 79   Temp 36.9 C (Oral)   Resp 18   Ht $R'5\' 10"'fB$  (1.778 m)   Wt 56.7 kg   SpO2 99%   BMI 17.94 kg/m  Provider wore N95 and universal face mask. Patient wore face mask. A&O. No acute distress, but intermittently repositioning reportedly due to phantom pain or back spasms. Heart RRR, II/VI SEM. Lung with faint left basilar crackles that cleared with deep breathing, clear on right. He has a right BKA prosthesis. LLE 2+ generalized edema, which he reported is overall stable, perhaps a little worse since he has been out in his wheelchair.  He also uses a walker at home. Right hand warm to touch, no cyanosis. The dorsal surface of his right hand is erythematous  extending up to MCP joints and to the medial volar surface. There is no drainage. 2+ right hand swelling. He is unable to close his right hand to a fist. His RUE AVF does not appear to be involved, + thrill.   PROVIDERS: [name], NP is PCP [facility])  - Lauree Chandler, MD is HF cardiologist. Last visit 04/28/20. Known chronically occluded RCA. No chest pain. Continue Plavix, statin, Coreg. Volume managed with HD. SBE prophylaxis for TAVR. HTN controlled at that time and managed by nephrology. No recent palpitations on amiodarone. He was planning back surgery at that time and no further preoperative cardiac work-up recommended at that time. One year follow-up planned.   Kathlyn Sacramento, MD is [facility] cardiologist. Last visit 06/28/20. Most recent Doppler showed patent left  FPBG. On Plavix long-term. Continue medical treatment of CAD. CHF/volume appeared stable--unable to tolerate HF medication due to hypotension during dialysis. Six month follow-up planned.  Servando Snare, MD Is vascular surgeon - [name], MD is nephrologist is with [place] (MWF at [facility] location) - Sullivan Lone, MD is hematologist. Last visit 09/04/18 for AL Amyloidosis and mild thrombocytopenia and anemia follow-up.     LABS: PAT labs noted. CMET and CBC also reviewed from ED evaluation.  (all labs ordered are listed, but only abnormal results are displayed)  Labs Reviewed  BASIC METABOLIC PANEL - Abnormal; Notable for the following components:      Result Value   Chloride 91 (*)    Glucose, Bld 100 (*)    BUN 35 (*)    Creatinine, Ser 7.34 (*)    GFR, Estimated 7 (*)    Anion gap 18 (*)    All other components within normal limits  CBC - Abnormal; Notable for the following components:   RBC 3.39 (*)    Hemoglobin 10.6 (*)    HCT 32.5 (*)    RDW 15.9 (*)    Platelets 137 (*)    All other components within normal limits     IMAGES: 1V PCXR 10/17/20: IMPRESSION: 1. Interval improvement of pulmonary edema. 2. Persistent hazy retrocardiac airspace opacity is favored to represent atelectasis, though infection could have a similar appearance.   MRI Left wrist 09/13/20 ([facility]/PACS): IMPRESSION: Multiple soft tissue calcifications most consistent with a benign entity such as calcinosis of chronic renal failure or idiopathic tumoral calcinosis. The exam is otherwise negative.   EKG:  EKG 10/25/20: Normal sinus rhythm Right bundle branch block T wave abnormality, consider inferior ischemia Abnormal ECG - Rate is slower when compared to 10/16/20 tracing   EKG 10/16/20: Ventricular rate 105 bpm Sinus or ectopic atrial tachycardia RBBB and LPFB Consider left ventricular hypertrophy Repol abnrm suggests ischemia, diffuse leads ST  depressions unchanged from previous Confirmed by Wandra Arthurs [phone]) on 10/16/2020 8:55:35 PM Also confirmed by Wandra Arthurs [phone]), editor Mariam Dollar [phone]) on 10/17/2020 10:15:55 AM   CV: US Carotid 05/24/20: Summary:  - Right Carotid: Velocities in the right ICA are consistent with a 1-39% stenosis.  - Left Carotid: Velocities in the left ICA are consistent with a 1-39% stenosis. The ECA appears >50% stenosed.  - Vertebrals:  Left vertebral artery demonstrates antegrade flow. Right vertebral artery demonstrates retrograde flow. Subclavians: Right subclavian artery was stenotic.    Echo 10/22/19: IMPRESSIONS   1. Left ventricular ejection fraction, by estimation, is 45 to 50%. The  left ventricle has mildly decreased function. The left ventricle  demonstrates global hypokinesis.  There is moderate concentric left  ventricular hypertrophy. Left ventricular  diastolic parameters are consistent with Grade I diastolic dysfunction  (impaired relaxation). There is hypokinesis of the left ventricular, basal  inferior wall and inferolateral wall.   2. Right ventricular systolic function is mildly reduced. The right  ventricular size is normal. Tricuspid regurgitation signal is inadequate  for assessing PA pressure.   3. Left atrial size was mild to moderately dilated.   4. The mitral valve is abnormal. Trivial mitral valve regurgitation. No  evidence of mitral stenosis.   5. The aortic valve has been repaired/replaced. Aortic valve  regurgitation is not visualized. There is a 26 mm Edwards Sapien  prosthetic (TAVR) valve present in the aortic position. Procedure Date:  10/21/2018. Echo findings are consistent with normal  structure and function of the aortic valve prosthesis. Aortic valve mean  gradient measures 9.0 mmHg. Aortic valve Vmax measures 1.95 m/s.  - Comparison(s): Prior images reviewed side by side.  - Conclusion(s)/Recommendation(s): Images reviewed side by side with prior   echoes. EF unchanged visually, but based on available images/measurements,  appears that EF low normal to mildly reduced. Stable post TAVR.    Cardiac cath 07/24/18 (Pre-TAVR): - Left Main  Vessel is normal in caliber. Vessel is angiographically normal.  - Left Anterior Descending  The proximal vessel has an eccentric 40% stenosis unchanged from past cath studies. The vessel is diffusely calcified.  Prox LAD lesion 40% stenosed  Prox LAD lesion is 40% stenosed. The lesion is moderately calcified.  - Ramus Intermedius  Large branch with no high-grade stenosis  Ramus lesion 40% stenosed  Ramus lesion is 40% stenosed.  - Left Circumflex  Ost Cx to Prox Cx lesion 65% stenosed  Ost Cx to Prox Cx lesion is 65% stenosed.  - Right Coronary Artery  Ost RCA to Mid RCA lesion 100% stenosed  Ost RCA to Mid RCA lesion is 100% stenosed.  Right Posterior Atrioventricular Branch  - Collaterals  Post Atrio filled by collaterals from 2nd Sept.  Conclusion: 1. Severe single vessel CAD with chronic total occlusion of the RCA, collateralized with L--->R collaterals 2. Mild calcific nonobstructive LCA disease, anatomy unchanged from previous cath studies 3. At least moderate AS, possibly severe LFLG - will check echo Recommend: medical therapy, no target for PCI      Past Medical History:  Diagnosis Date   Amyloidosis ([place])    Anemia of chronic disease    Anxiety    CAD (coronary artery disease)    a. cardiac cath on 11/25/17 with occlusion of the RCA which could have been his event but the RCA could not be engaged. The RCA filled distally from left to right collaterals. There were no severe stenoses in the left coronary system. Medical therapy recommended, started on plavix.   Chronic combined systolic and diastolic CHF (congestive heart failure) ([place])    a. EF dropped to 35-40% with grade 2 DD by echo 11/2017.   DJD (degenerative joint disease), lumbar    ESRD (end stage renal disease) ([place])     ESRD- HEMO MWF- [address]   GERD (gastroesophageal reflux disease)    Hyperlipidemia    Hypertension    Myocardial infarction [facility])    Neuropathy    Peripheral vascular disease ([facility])    Pneumonia    Restless legs    S/P TAVR (transcatheter aortic valve replacement) 10/21/2018   26 mm Edwards Sapien 3 Ultra transcatheter heart valve placed via percutaneous right transfemoral approach  Severe aortic stenosis    severe    Past Surgical History:  Procedure Laterality Date   ABDOMINAL AORTOGRAM W/LOWER EXTREMITY Bilateral 07/16/2018   Procedure: ABDOMINAL AORTOGRAM W/LOWER EXTREMITY;  Surgeon: Wellington Hampshire, MD;  Location: [place] CV LAB;  Service: Cardiovascular;  Laterality: Bilateral;   ABDOMINAL AORTOGRAM W/LOWER EXTREMITY N/A 04/09/2019   Procedure: ABDOMINAL AORTOGRAM W/LOWER EXTREMITY - Right Lower;  Surgeon: Waynetta Sandy, MD;  Location: [place] CV LAB;  Service: Cardiovascular;  Laterality: N/A;   AMPUTATION Left 08/19/2018   Procedure: AMPUTATION LEFT TOES SECOND AND THIRD;  Surgeon: Waynetta Sandy, MD;  Location: [place];  Service: Vascular;  Laterality: Left;   AMPUTATION Right 05/07/2019   Procedure: AMPUTATION BELOW KNEE;  Surgeon: Serafina Mitchell, MD;  Location: [place];  Service: Vascular;  Laterality: Right;   AV FISTULA PLACEMENT Right 12/27/2015   Procedure: Right Arm ARTERIOVENOUS (AV) FISTULA CREATION;  Surgeon: Angelia Mould, MD;  Location: [place];  Service: Vascular;  Laterality: Right;   CATARACT EXTRACTION W/ INTRAOCULAR LENS  IMPLANT, BILATERAL     COLONOSCOPY     ERCP W/ SPHICTEROTOMY     FEMORAL-POPLITEAL BYPASS GRAFT Left 07/22/2018   Procedure: left FEMORAL TO BELOW KNEE POPLITEAL ARTERY BYPASS GREAFT using 23mm removable ring propaten gore graft;  Surgeon: Waynetta Sandy, MD;  Location: [place];  Service: Vascular;  Laterality: Left;   FEMORAL-POPLITEAL BYPASS GRAFT Right 04/10/2019   Procedure: BYPASS GRAFT RIGHT  FEMORAL-POPLITEAL ARTERY;  Surgeon: Rosetta Posner, MD;  Location: [place];  Service: Vascular;  Laterality: Right;   I & D EXTREMITY Right 05/05/2019   Procedure: Revision of  RIGHT  FOOT Amputation;  Surgeon: Waynetta Sandy, MD;  Location: [place];  Service: Vascular;  Laterality: Right;   INTRAOPERATIVE TRANSTHORACIC ECHOCARDIOGRAM N/A 10/21/2018   Procedure: Intraoperative Transthoracic Echocardiogram;  Surgeon: Sherren Mocha, MD;  Location: [place];  Service: Open Heart Surgery;  Laterality: N/A;   IR GENERIC HISTORICAL  01/27/2016   IR FLUORO GUIDE CV LINE RIGHT 01/27/2016 Arne Cleveland, MD [facility]   IR GENERIC HISTORICAL  01/27/2016   IR US GUIDE VASC ACCESS RIGHT 01/27/2016 Arne Cleveland, MD [facility]   LAPAROSCOPIC CHOLECYSTECTOMY  03/2011   LEFT HEART CATH AND CORONARY ANGIOGRAPHY N/A 11/25/2017   Procedure: LEFT HEART CATH AND CORONARY ANGIOGRAPHY;  Surgeon: Martinique, Peter M, MD;  Location: [place] CV LAB;  Service: Cardiovascular;  Laterality: N/A;   LEFT HEART CATH AND CORONARY ANGIOGRAPHY N/A 07/24/2018   Procedure: LEFT HEART CATH AND CORONARY ANGIOGRAPHY;  Surgeon: Sherren Mocha, MD;  Location: [place] CV LAB;  Service: Cardiovascular;  Laterality: N/A;   RENAL BIOPSY, PERCUTANEOUS Right 09/08/2014   RIGHT/LEFT HEART CATH AND CORONARY ANGIOGRAPHY N/A 03/25/2018   Procedure: RIGHT/LEFT HEART CATH AND CORONARY ANGIOGRAPHY;  Surgeon: Martinique, Peter M, MD;  Location: [place] CV LAB;  Service: Cardiovascular;  Laterality: N/A;   TEE WITHOUT CARDIOVERSION N/A 05/14/2019   Procedure: TRANSESOPHAGEAL ECHOCARDIOGRAM (TEE);  Surgeon: Fay Records, MD;  Location: [facility] ENDOSCOPY;  Service: Cardiovascular;  Laterality: N/A;   TOE AMPUTATION Left 08/19/2018   2nd & 3rd toes   TONSILLECTOMY  ~ 1960   TRANSCATHETER AORTIC VALVE REPLACEMENT, TRANSFEMORAL N/A 10/21/2018   Procedure: TRANSCATHETER AORTIC VALVE REPLACEMENT, TRANSFEMORAL;  Surgeon: Sherren Mocha, MD;  Location:  [place];  Service: Open Heart Surgery;  Laterality: N/A;   TRANSMETATARSAL AMPUTATION Right 04/29/2019   Procedure: RIGHT TRANSMETATARSAL AMPUTATION;  Surgeon: Servando Snare  Harrell Gave, MD;  Location: [place];  Service: Vascular;  Laterality: Right;   ULTRASOUND GUIDANCE FOR VASCULAR ACCESS  03/25/2018   Procedure: Ultrasound Guidance For Vascular Access;  Surgeon: Martinique, Peter M, MD;  Location: [place] CV LAB;  Service: Cardiovascular;;    MEDICATIONS:  acetaminophen (TYLENOL) 325 MG tablet   amiodarone (PACERONE) 200 MG tablet   amLODipine (NORVASC) 10 MG tablet   atorvastatin (LIPITOR) 80 MG tablet   B Complex-C-Folic Acid (RENA-VITE RX) 1 MG TABS   clopidogrel (PLAVIX) 75 MG tablet   clopidogrel (PLAVIX) 75 MG tablet   Darbepoetin Alfa (ARANESP) 100 MCG/0.5ML SOSY injection   docusate sodium (COLACE) 100 MG capsule   iron sucrose in sodium chloride 0.9 % 100 mL   lanthanum (FOSRENOL) 1000 MG chewable tablet   LORazepam (ATIVAN) 1 MG tablet   Methoxy PEG-Epoetin Beta (MIRCERA IJ)   multivitamin (RENA-VIT) TABS tablet   ondansetron (ZOFRAN) 4 MG tablet   oxyCODONE (OXYCONTIN) 10 mg 12 hr tablet   Oxycodone HCl 10 MG TABS   pantoprazole (PROTONIX) 40 MG tablet   vitamin B-12 (CYANOCOBALAMIN) 1000 MCG tablet   No current facility-administered medications for this encounter.    Myra Gianotti, PA-C Surgical Short Stay/Anesthesiology [facility] Phone [phone] [facility] Phone [phone] 10/25/2020 7:11 PM

## 2020-10-25 NOTE — ED Provider Notes (Signed)
Emergency Medicine Provider Triage Evaluation Note  Jose Garrett , a 70 y.o. male  was evaluated in triage.  Pt complains of cellulitis in hand.  Blood pressure over 984 systolic. Wife  linda [phone]  Pt is on dialysis.  Pt was having preop evaluation today  Pt suppose to hand hand surgery on 8/24  Pt brought to Ed by PA for elevated blood pressure  Review of Systems  Positive: High blood pressure Negative: No fever  Physical Exam  There were no vitals taken for this visit. Gen:   Awake, no distress   Resp:  Normal effort  MSK:   Moves extremities without difficulty  Other:    Medical Decision Making  Medically screening exam initiated at 3:19 PM.  Appropriate orders placed.  AMEN STASZAK was informed that the remainder of the evaluation will be completed by another provider, this initial triage assessment does not replace that evaluation, and the importance of remaining in the ED until their evaluation is complete.     Fransico Meadow, Hershal Coria 10/25/20 1525    Blanchie Dessert, MD 10/26/20 [phone]

## 2020-10-25 NOTE — Discharge Instructions (Addendum)
Please follow up with your Primary Care Doctor within 48-72 hours - call for an appointment. Rest and elevate affected area. Take Motrin 600mg  every 8 hours with food for pain. Please take Antibiotics as directed. If you experience any worsening redness, swelling, streaking (red lines), fever or chills please return to ED.   Please return to the emergency department immediately for any new or concerning symptoms, or if you get worse.

## 2020-10-25 NOTE — ED Notes (Signed)
Pt would like [name] (ex-wife) notified when pt is in a room [phone].

## 2020-10-26 ENCOUNTER — Other Ambulatory Visit (HOSPITAL_COMMUNITY): Payer: Self-pay

## 2020-10-26 NOTE — Progress Notes (Signed)
Left VM instructing pt to stop plavix 5 Days before sx.

## 2020-10-28 NOTE — Telephone Encounter (Signed)
Left message to call back at 12:04 PM on 10/28/2020.  Patient needs call back.

## 2020-10-31 ENCOUNTER — Other Ambulatory Visit (HOSPITAL_COMMUNITY): Payer: Self-pay

## 2020-11-01 NOTE — Telephone Encounter (Signed)
Pt is returning call in regards to his Pre Op clearance. Pt states his VM on his phone is currently not working.

## 2020-11-01 NOTE — Telephone Encounter (Addendum)
   Name: Jose Garrett  DOB: 17-Oct-1950  MRN: [id number]   Primary Cardiologist: Lauree Chandler, MD  Chart reviewed as part of pre-operative protocol coverage. Patient was contacted 11/01/2020 in reference to pre-operative risk assessment for pending surgery as outlined below.  Jose Garrett was last seen on 06/2020 by Dr. Fletcher Anon, extensive history reviewed. As noted below, this is a duplicate request from 09/08/50. Per telephone note 09/29/20 and per [facility] recommendations, Jose Garrett was felt to be at acceptable risk for the planned procedure without further cardiovascular testing. Per Dr. Angelena Form patient was cleared to hold plavix 5 days prior to his upcoming procedure.    As noted below in the interim since last clearance, he was recently hospitalized 10/16/20 for hypoxic respiratory failure in the setting of volume overload felt likely triggered by incomplete HD sessions due to inability to tolerate the full session due to back spasms. He has also had issues with high blood pressure recently. Amlodipine was increased to 10mg  daily. He was also seen in the ED 8/16 due to cellulitis of the hand, treated with abx.  The patient affirms he is feeling well without any chest pain or further SOB. He reports his BP generally runs 140-160s. He states it is 180-200 pre-dialysis and generally 841-324 systolic post-dialysis/in between sessions. He was previously on hydralazine but per 8/4 HTN clinic visit, patient indicated that nephrologist preferred amlodipine instead, and per notes, due to his hx of significantly low BP after HD, patient's blood pressure should not be aggressively lowered.  I reached out to Dr. Angelena Form for input on recent hemodynamic events who feels that patient may proceed with procedure as planned from cardiac standpoint without further cardiac testing. At baseline he is at increased risk due to his comorbidities but Dr. Angelena Form does not feel that any specific additional cardiac  testing is required pre-operatively for this type of procedure. I called patient back to let him know. The patient has already been holding Plavix for his procedure. The patient was advised that if he develops new symptoms prior to surgery to contact our office to arrange for a follow-up visit, and he verbalized understanding.  Will route this update to requesting provider via Epic fax function. Please call with questions.   Jose Pitter, PA-C 11/01/2020, 11:05 AM

## 2020-11-01 NOTE — Anesthesia Preprocedure Evaluation (Addendum)
Anesthesia Evaluation  Patient identified by MRN, date of birth, ID band Patient awake    Reviewed: Allergy & Precautions, NPO status , Patient's Chart, lab work & pertinent test results  History of Anesthesia Complications (+) PONV  Airway Mallampati: II  TM Distance: >3 FB     Dental  (+) Dental Advisory Given   Pulmonary former smoker,    breath sounds clear to auscultation       Cardiovascular hypertension, Pt. on medications + CAD, + Past MI and +CHF  + dysrhythmias Supra Ventricular Tachycardia + Valvular Problems/Murmurs (S/P TAVR)  Rhythm:Regular Rate:Normal  EF 40-45%   Neuro/Psych  Headaches,    GI/Hepatic GERD  Medicated,  Endo/Other    Renal/GU Dialysis and ESRFRenal diseasemwf dialysis     Musculoskeletal  (+) Arthritis ,   Abdominal   Peds  Hematology  (+) anemia , Lab Results      Component                Value               Date                      WBC                      9.3                 10/25/2020                HGB                      10.3 (L)            10/25/2020                HCT                      32.2 (L)            10/25/2020                MCV                      96.1                10/25/2020                PLT                      139 (L)             10/25/2020              Anesthesia Other Findings R BKA  Amyloidosis  Reproductive/Obstetrics                           Anesthesia Physical Anesthesia Plan  ASA: 4  Anesthesia Plan: General   Post-op Pain Management:    Induction: Intravenous  PONV Risk Score and Plan: 3 and Treatment may vary due to age or medical condition, Ondansetron, Midazolam and Dexamethasone  Airway Management Planned: LMA  Additional Equipment: None  Intra-op Plan:   Post-operative Plan: Extubation in OR  Informed Consent: I have reviewed the patients History and Physical, chart, labs and discussed the  procedure including the risks, benefits and alternatives for the proposed anesthesia with the patient or authorized  representative who has indicated his/her understanding and acceptance.     Dental advisory given  Plan Discussed with:   Anesthesia Plan Comments: ( )     Anesthesia Quick Evaluation

## 2020-11-02 ENCOUNTER — Ambulatory Visit (HOSPITAL_COMMUNITY): Payer: Medicare Other | Admitting: Anesthesiology

## 2020-11-02 ENCOUNTER — Other Ambulatory Visit (HOSPITAL_COMMUNITY): Payer: Self-pay

## 2020-11-02 ENCOUNTER — Ambulatory Visit (HOSPITAL_COMMUNITY): Payer: Medicare Other | Admitting: Vascular Surgery

## 2020-11-02 ENCOUNTER — Ambulatory Visit (HOSPITAL_COMMUNITY)
Admission: RE | Admit: 2020-11-02 | Discharge: 2020-11-02 | Disposition: A | Discharge status: Home / Self Care | Payer: Medicare Other | Source: Home / Self Care | Attending: Orthopedic Surgery | Admitting: Orthopedic Surgery

## 2020-11-02 ENCOUNTER — Other Ambulatory Visit: Payer: Self-pay

## 2020-11-02 ENCOUNTER — Encounter (HOSPITAL_COMMUNITY)
Admission: RE | Disposition: A | Discharge status: Home / Self Care | Payer: Self-pay | Source: Home / Self Care | Attending: Orthopedic Surgery

## 2020-11-02 ENCOUNTER — Encounter (HOSPITAL_COMMUNITY): Payer: Self-pay | Admitting: Orthopedic Surgery

## 2020-11-02 DIAGNOSIS — R410 Disorientation, unspecified: Secondary | ICD-10-CM | POA: Diagnosis not present

## 2020-11-02 DIAGNOSIS — Z87891 Personal history of nicotine dependence: Secondary | ICD-10-CM | POA: Insufficient documentation

## 2020-11-02 DIAGNOSIS — Z79899 Other long term (current) drug therapy: Secondary | ICD-10-CM | POA: Insufficient documentation

## 2020-11-02 DIAGNOSIS — Z7902 Long term (current) use of antithrombotics/antiplatelets: Secondary | ICD-10-CM | POA: Insufficient documentation

## 2020-11-02 DIAGNOSIS — M60232 Foreign body granuloma of soft tissue, not elsewhere classified, left forearm: Secondary | ICD-10-CM | POA: Insufficient documentation

## 2020-11-02 DIAGNOSIS — G928 Other toxic encephalopathy: Secondary | ICD-10-CM | POA: Diagnosis not present

## 2020-11-02 HISTORY — PX: GANGLION CYST EXCISION: SHX1691

## 2020-11-02 LAB — POCT I-STAT, CHEM 8
BUN: 23 mg/dL (ref 8–23)
Calcium, Ion: 1.14 mmol/L — ABNORMAL LOW (ref 1.15–1.40)
Chloride: 94 mmol/L — ABNORMAL LOW (ref 98–111)
Creatinine, Ser: 6.5 mg/dL — ABNORMAL HIGH (ref 0.61–1.24)
Glucose, Bld: 144 mg/dL — ABNORMAL HIGH (ref 70–99)
HCT: 25 % — ABNORMAL LOW (ref 39.0–52.0)
Hemoglobin: 8.5 g/dL — ABNORMAL LOW (ref 13.0–17.0)
Potassium: 3.2 mmol/L — ABNORMAL LOW (ref 3.5–5.1)
Sodium: 137 mmol/L (ref 135–145)
TCO2: 32 mmol/L (ref 22–32)

## 2020-11-02 SURGERY — EXCISION, GANGLION CYST, WRIST
Anesthesia: General | Site: Wrist | Laterality: Left

## 2020-11-02 MED ORDER — POVIDONE-IODINE 10 % EX SWAB
2.0000 "application " | Freq: Once | CUTANEOUS | Status: AC
  Administered 2020-11-02: 2 via TOPICAL

## 2020-11-02 MED ORDER — PROPOFOL 10 MG/ML IV BOLUS
INTRAVENOUS | Status: DC | PRN
  Administered 2020-11-02: 20 mg via INTRAVENOUS
  Administered 2020-11-02: 130 mg via INTRAVENOUS
  Administered 2020-11-02: 20 mg via INTRAVENOUS

## 2020-11-02 MED ORDER — DEXAMETHASONE SODIUM PHOSPHATE 10 MG/ML IJ SOLN
INTRAMUSCULAR | Status: DC | PRN
  Administered 2020-11-02: 4 mg via INTRAVENOUS

## 2020-11-02 MED ORDER — PHENYLEPHRINE 40 MCG/ML (10ML) SYRINGE FOR IV PUSH (FOR BLOOD PRESSURE SUPPORT)
PREFILLED_SYRINGE | INTRAVENOUS | Status: DC | PRN
  Administered 2020-11-02: 40 ug via INTRAVENOUS
  Administered 2020-11-02 (×2): 80 ug via INTRAVENOUS
  Administered 2020-11-02: 120 ug via INTRAVENOUS

## 2020-11-02 MED ORDER — OXYCODONE-ACETAMINOPHEN 5-325 MG PO TABS
1.0000 | ORAL_TABLET | ORAL | 0 refills | Status: DC | PRN
  Filled 2020-11-02: qty 20, 4d supply, fill #0

## 2020-11-02 MED ORDER — MIDAZOLAM HCL 2 MG/2ML IJ SOLN
INTRAMUSCULAR | Status: AC
  Filled 2020-11-02: qty 2

## 2020-11-02 MED ORDER — FENTANYL CITRATE (PF) 250 MCG/5ML IJ SOLN
INTRAMUSCULAR | Status: AC
  Filled 2020-11-02: qty 5

## 2020-11-02 MED ORDER — LIDOCAINE 2% (20 MG/ML) 5 ML SYRINGE
INTRAMUSCULAR | Status: DC | PRN
  Administered 2020-11-02: 20 mg via INTRAVENOUS

## 2020-11-02 MED ORDER — SODIUM CHLORIDE 0.9 % IV SOLN: INTRAVENOUS | Status: DC

## 2020-11-02 MED ORDER — CEFAZOLIN SODIUM-DEXTROSE 2-4 GM/100ML-% IV SOLN
2.0000 g | INTRAVENOUS | Status: AC
  Administered 2020-11-02: 2 g via INTRAVENOUS
  Filled 2020-11-02: qty 100

## 2020-11-02 MED ORDER — PHENYLEPHRINE HCL-NACL 20-0.9 MG/250ML-% IV SOLN
INTRAVENOUS | Status: DC | PRN
  Administered 2020-11-02: 25 ug/min via INTRAVENOUS

## 2020-11-02 MED ORDER — MIDAZOLAM HCL 5 MG/5ML IJ SOLN
INTRAMUSCULAR | Status: DC | PRN
  Administered 2020-11-02: 2 mg via INTRAVENOUS

## 2020-11-02 MED ORDER — PROMETHAZINE HCL 25 MG/ML IJ SOLN: 6.2500 mg | INTRAMUSCULAR | Status: DC | PRN

## 2020-11-02 MED ORDER — EPHEDRINE SULFATE-NACL 50-0.9 MG/10ML-% IV SOSY
PREFILLED_SYRINGE | INTRAVENOUS | Status: DC | PRN
  Administered 2020-11-02: 10 mg via INTRAVENOUS

## 2020-11-02 MED ORDER — LIDOCAINE 2% (20 MG/ML) 5 ML SYRINGE
INTRAMUSCULAR | Status: AC
  Filled 2020-11-02: qty 5

## 2020-11-02 MED ORDER — DEXAMETHASONE SODIUM PHOSPHATE 10 MG/ML IJ SOLN
INTRAMUSCULAR | Status: AC
  Filled 2020-11-02: qty 1

## 2020-11-02 MED ORDER — BUPIVACAINE-EPINEPHRINE (PF) 0.25% -1:200000 IJ SOLN
INTRAMUSCULAR | Status: AC
  Filled 2020-11-02: qty 30

## 2020-11-02 MED ORDER — FENTANYL CITRATE (PF) 100 MCG/2ML IJ SOLN: 25.0000 ug | INTRAMUSCULAR | Status: DC | PRN

## 2020-11-02 MED ORDER — ONDANSETRON HCL 4 MG/2ML IJ SOLN
INTRAMUSCULAR | Status: DC | PRN
  Administered 2020-11-02: 4 mg via INTRAVENOUS

## 2020-11-02 MED ORDER — SCOPOLAMINE 1 MG/3DAYS TD PT72
1.0000 | MEDICATED_PATCH | TRANSDERMAL | Status: DC
  Administered 2020-11-02: 1.5 mg via TRANSDERMAL
  Filled 2020-11-02: qty 1

## 2020-11-02 MED ORDER — BUPIVACAINE-EPINEPHRINE (PF) 0.25% -1:200000 IJ SOLN
INTRAMUSCULAR | Status: DC | PRN
  Administered 2020-11-02: 10 mL

## 2020-11-02 MED ORDER — ONDANSETRON HCL 4 MG/2ML IJ SOLN
INTRAMUSCULAR | Status: AC
  Filled 2020-11-02: qty 2

## 2020-11-02 MED ORDER — PROPOFOL 10 MG/ML IV BOLUS
INTRAVENOUS | Status: AC
  Filled 2020-11-02: qty 20

## 2020-11-02 MED ORDER — EPHEDRINE 5 MG/ML INJ
INTRAVENOUS | Status: AC
  Filled 2020-11-02: qty 5

## 2020-11-02 MED ORDER — KETOROLAC TROMETHAMINE 30 MG/ML IJ SOLN
INTRAMUSCULAR | Status: AC
  Filled 2020-11-02: qty 1

## 2020-11-02 MED ORDER — CHLORHEXIDINE GLUCONATE 0.12 % MT SOLN
15.0000 mL | Freq: Once | OROMUCOSAL | Status: AC
Start: 2020-11-02 — End: 2020-11-02
  Administered 2020-11-02: 15 mL via OROMUCOSAL
  Filled 2020-11-02: qty 15

## 2020-11-02 MED ORDER — FENTANYL CITRATE (PF) 250 MCG/5ML IJ SOLN
INTRAMUSCULAR | Status: DC | PRN
  Administered 2020-11-02 (×2): 50 ug via INTRAVENOUS

## 2020-11-02 MED ORDER — 0.9 % SODIUM CHLORIDE (POUR BTL) OPTIME
TOPICAL | Status: DC | PRN
  Administered 2020-11-02: 1000 mL

## 2020-11-02 MED ORDER — ORAL CARE MOUTH RINSE
15.0000 mL | Freq: Once | OROMUCOSAL | Status: AC
Start: 2020-11-02 — End: 2020-11-02

## 2020-11-02 MED ORDER — PHENYLEPHRINE 40 MCG/ML (10ML) SYRINGE FOR IV PUSH (FOR BLOOD PRESSURE SUPPORT)
PREFILLED_SYRINGE | INTRAVENOUS | Status: AC
  Filled 2020-11-02: qty 10

## 2020-11-02 SURGICAL SUPPLY — 61 items
APPLIER CLIP 11 MED OPEN (CLIP) ×3
APR CLP MED 11 20 MLT OPN (CLIP) ×1
BAG COUNTER SPONGE SURGICOUNT (BAG) ×1 IMPLANT
BAG SPNG CNTER NS LX DISP (BAG)
BAG SURGICOUNT SPONGE COUNTING (BAG)
BNDG CMPR 9X4 STRL LF SNTH (GAUZE/BANDAGES/DRESSINGS) ×1
BNDG ELASTIC 3X5.8 VLCR STR LF (GAUZE/BANDAGES/DRESSINGS) ×4 IMPLANT
BNDG ELASTIC 4X5.8 VLCR STR LF (GAUZE/BANDAGES/DRESSINGS) ×2 IMPLANT
BNDG ESMARK 4X9 LF (GAUZE/BANDAGES/DRESSINGS) ×3 IMPLANT
BNDG GAUZE ELAST 4 BULKY (GAUZE/BANDAGES/DRESSINGS) ×2 IMPLANT
CLIP APPLIE 11 MED OPEN (CLIP) IMPLANT
CLOSURE WOUND 1/2 X4 (GAUZE/BANDAGES/DRESSINGS)
CORD BIPOLAR FORCEPS 12FT (ELECTRODE) ×3 IMPLANT
COVER SURGICAL LIGHT HANDLE (MISCELLANEOUS) ×3 IMPLANT
CUFF TOURN SGL QUICK 18X4 (TOURNIQUET CUFF) IMPLANT
CUFF TOURN SGL QUICK 24 (TOURNIQUET CUFF)
CUFF TRNQT CYL 24X4X16.5-23 (TOURNIQUET CUFF) IMPLANT
DECANTER SPIKE VIAL GLASS SM (MISCELLANEOUS) IMPLANT
DRAPE SURG 17X23 STRL (DRAPES) ×3 IMPLANT
DURAPREP 26ML APPLICATOR (WOUND CARE) ×3 IMPLANT
GAUZE 4X4 16PLY ~~LOC~~+RFID DBL (SPONGE) ×4 IMPLANT
GAUZE SPONGE 4X4 12PLY STRL (GAUZE/BANDAGES/DRESSINGS) ×2 IMPLANT
GAUZE XEROFORM 1X8 LF (GAUZE/BANDAGES/DRESSINGS) ×2 IMPLANT
GLOVE SURG SYN 8.0 (GLOVE) ×3 IMPLANT
GLOVE SURG SYN 8.0 PF PI (GLOVE) ×1 IMPLANT
GOWN STRL REUS W/ TWL LRG LVL3 (GOWN DISPOSABLE) ×1 IMPLANT
GOWN STRL REUS W/ TWL XL LVL3 (GOWN DISPOSABLE) ×1 IMPLANT
GOWN STRL REUS W/TWL LRG LVL3 (GOWN DISPOSABLE) ×3
GOWN STRL REUS W/TWL XL LVL3 (GOWN DISPOSABLE) ×3
KIT BASIN OR (CUSTOM PROCEDURE TRAY) ×3 IMPLANT
KIT TURNOVER KIT B (KITS) ×3 IMPLANT
MANIFOLD NEPTUNE II (INSTRUMENTS) ×1 IMPLANT
NDL HYPO 25GX1X1/2 BEV (NEEDLE) IMPLANT
NEEDLE HYPO 25GX1X1/2 BEV (NEEDLE) ×3 IMPLANT
NS IRRIG 1000ML POUR BTL (IV SOLUTION) ×3 IMPLANT
PACK ORTHO EXTREMITY (CUSTOM PROCEDURE TRAY) ×3 IMPLANT
PAD ARMBOARD 7.5X6 YLW CONV (MISCELLANEOUS) ×4 IMPLANT
PAD CAST 3X4 CTTN HI CHSV (CAST SUPPLIES) IMPLANT
PAD CAST 4YDX4 CTTN HI CHSV (CAST SUPPLIES) IMPLANT
PADDING CAST COTTON 3X4 STRL (CAST SUPPLIES)
PADDING CAST COTTON 4X4 STRL (CAST SUPPLIES) ×3
SPECIMEN JAR SMALL (MISCELLANEOUS) ×3 IMPLANT
STRIP CLOSURE SKIN 1/2X4 (GAUZE/BANDAGES/DRESSINGS) IMPLANT
SUCTION FRAZIER HANDLE 10FR (MISCELLANEOUS)
SUCTION TUBE FRAZIER 10FR DISP (MISCELLANEOUS) IMPLANT
SUT ETHILON 5 0 PS 2 18 (SUTURE) IMPLANT
SUT PROLENE 3 0 PS 1 (SUTURE) ×2 IMPLANT
SUT PROLENE 3 0 PS 2 (SUTURE) ×4 IMPLANT
SUT VIC AB 3-0 FS2 27 (SUTURE) ×2 IMPLANT
SUT VIC AB 4-0 P-3 18X BRD (SUTURE) IMPLANT
SUT VIC AB 4-0 P3 18 (SUTURE) ×3
SUT VICRYL AB 2 0 TIES (SUTURE) ×2 IMPLANT
SWAB CULTURE ESWAB REG 1ML (MISCELLANEOUS) ×2 IMPLANT
SWAB CULTURE LIQUID MINI MALE (MISCELLANEOUS) ×2 IMPLANT
SYR CONTROL 10ML LL (SYRINGE) ×2 IMPLANT
TOWEL GREEN STERILE (TOWEL DISPOSABLE) ×3 IMPLANT
TOWEL GREEN STERILE FF (TOWEL DISPOSABLE) ×3 IMPLANT
TUBE CONNECTING 12'X1/4 (SUCTIONS)
TUBE CONNECTING 12X1/4 (SUCTIONS) IMPLANT
UNDERPAD 30X36 HEAVY ABSORB (UNDERPADS AND DIAPERS) ×3 IMPLANT
WATER STERILE IRR 1000ML POUR (IV SOLUTION) ×3 IMPLANT

## 2020-11-02 NOTE — Brief Op Note (Signed)
11/02/2020  3:20 PM  PATIENT:  Jose Garrett  70 y.o. male  PRE-OPERATIVE DIAGNOSIS:  LEFT WRIST DORSAL VOLAR MASS  POST-OPERATIVE DIAGNOSIS:  LEFT WRIST DORSAL VOLAR MASS  PROCEDURE:  Procedure(s) with comments: EXCISION OF LEFT WRIST DORAL VOLAR MASSES (Left) - CPT 16109  SURGEON:  Surgeon(s) and Role:    * Charlotte Crumb, MD - Primary  PHYSICIAN ASSISTANT:   ASSISTANTS: none   ANESTHESIA:   general  EBL: Minimal  BLOOD ADMINISTERED:none  DRAINS: none   LOCAL MEDICATIONS USED:  MARCAINE     SPECIMEN:  Biopsy / Limited Resection  DISPOSITION OF SPECIMEN:  PATHOLOGY  COUNTS:  YES  TOURNIQUET:   Total Tourniquet Time Documented: Upper Arm (Left) - 52 minutes Upper Arm (Left) - 16 minutes Total: Upper Arm (Left) - 68 minutes   DICTATION: .Viviann Spare Dictation  PLAN OF CARE: Discharge to home after PACU  PATIENT DISPOSITION:  PACU - hemodynamically stable.   Delay start of Pharmacological VTE agent (>24hrs) due to surgical blood loss or risk of bleeding: yes

## 2020-11-02 NOTE — Op Note (Signed)
Patient was taken the operating suite and after induction of adequate general anesthetic the left upper extremity was prepped and draped in usual sterile fashion.  An Esmarch was used to exsanguinate the limb tourniquet was inflated 2 and 50 mmHg.  This point time incision made over a 3 x 3 cm mass dorsal radially over the distal forearm and wrist area on the left.  Dissection was carried down to the interval between the flexor carpi radialis tendon and the radial artery.  Radial artery was completely occluded and part of this mass.  We carefully identified the proximal aspect of the radial artery and transected it.  The limb was completely occluded there was no flow.  We tied this off with 2-0 Vicryl suture.  We then dissected distally until the rest of the mass was carefully excised off the fascia overlying the flexor muscles and the FCR sheath.  This was sent for pathologic confirmation.  We found the distal aspect of the radial artery and tied this off as well.  The wound was thoroughly irrigated loosely closed in layers of 4-0 Vicryl subcutaneously bye-bye 3-0 Prolene interrupted horizontal mattress sutures.  Second incision was made over the anatomic snuffbox of the ipsilateral left side dissection was carried down to the skin subtenons tissues branches of the anatomic snuffbox were identified and carefully separated from underlying tissue.  The same tissue type was seen here as at the radial artery level.  1 branch of the snuffbox artery was ligated proximally distally with hemoclips and the transected material was sent for pathologic confirmation.  Both wounds were irrigated and the second incision was closed with 3-0 Prolene in horizontal mattress sutures.  Xeroform, 4 x 4's, compression bandage and a radial gutter splint was applied.  The patient tolerated this procedure well went to come in stable fashion.

## 2020-11-02 NOTE — Anesthesia Postprocedure Evaluation (Signed)
Anesthesia Post Note  Patient: Jose Garrett  Procedure(s) Performed: EXCISION OF LEFT WRIST DORAL VOLAR MASSES (Left: Wrist)     Patient location during evaluation: PACU Anesthesia Type: General Level of consciousness: awake and alert Pain management: pain level controlled Vital Signs Assessment: post-procedure vital signs reviewed and stable Respiratory status: spontaneous breathing, nonlabored ventilation, respiratory function stable and patient connected to nasal cannula oxygen Cardiovascular status: blood pressure returned to baseline and stable Postop Assessment: no apparent nausea or vomiting Anesthetic complications: no   No notable events documented.  Last Vitals:  Vitals:   11/02/20 1600 11/02/20 1615  BP: (!) 118/52 116/90  Pulse: 71 72  Resp: 15 15  Temp:  36.6 C  SpO2: 92% 95%    Last Pain:  Vitals:   11/02/20 1615  TempSrc:   PainSc: 0-No pain                 Barnet Glasgow

## 2020-11-02 NOTE — Anesthesia Procedure Notes (Signed)
Procedure Name: LMA Insertion Date/Time: 11/02/2020 1:55 PM Performed by: Jenne Campus, CRNA Pre-anesthesia Checklist: Patient identified, Emergency Drugs available, Suction available and Patient being monitored Patient Re-evaluated:Patient Re-evaluated prior to induction Oxygen Delivery Method: Circle System Utilized Preoxygenation: Pre-oxygenation with 100% oxygen Induction Type: IV induction Ventilation: Mask ventilation without difficulty LMA: LMA inserted LMA Size: 4.0 Number of attempts: 1 Airway Equipment and Method: Bite block Placement Confirmation: positive ETCO2 and breath sounds checked- equal and bilateral Tube secured with: Tape Dental Injury: Teeth and Oropharynx as per pre-operative assessment

## 2020-11-02 NOTE — Transfer of Care (Signed)
Immediate Anesthesia Transfer of Care Note  Patient: Jose Garrett  Procedure(s) Performed: EXCISION OF LEFT WRIST DORAL VOLAR MASSES (Left: Wrist)  Patient Location: PACU  Anesthesia Type:General  Level of Consciousness: awake and alert   Airway & Oxygen Therapy: Patient Spontanous Breathing and Patient connected to nasal cannula oxygen  Post-op Assessment: Report given to RN and Post -op Vital signs reviewed and stable  Post vital signs: Reviewed and stable  Last Vitals:  Vitals Value Taken Time  BP 114/51 11/02/20 1526  Temp    Pulse 71 11/02/20 1529  Resp 19 11/02/20 1529  SpO2 95 % 11/02/20 1529  Vitals shown include unvalidated device data.  Last Pain:  Vitals:   11/02/20 1315  TempSrc:   PainSc: 7       Patients Stated Pain Goal: 2 (22/63/33 [id number])  Complications: No notable events documented.

## 2020-11-02 NOTE — H&P (Signed)
Jose Garrett is an 70 y.o. male.   Chief Complaint: Left wrist masses and pain HPI: Patient is a very pleasant 70 year old male right-hand-dominant with symptomatic left wrist dorsal and palmar masses  Past Medical History:  Diagnosis Date   Amyloidosis ([place])    Anemia of chronic disease    Anxiety    CAD (coronary artery disease)    a. cardiac cath on 11/25/17 with occlusion of the RCA which could have been his event but the RCA could not be engaged. The RCA filled distally from left to right collaterals. There were no severe stenoses in the left coronary system. Medical therapy recommended, started on plavix.   Cancer [facility])    multiple myeloma   Chronic combined systolic and diastolic CHF (congestive heart failure) ([place])    a. EF dropped to 35-40% with grade 2 DD by echo 11/2017.   DJD (degenerative joint disease), lumbar    Dysrhythmia    afib   ESRD (end stage renal disease) (H[facility])    ESRD- HEMO MWF- [address]   GERD (gastroesophageal reflux disease)    Heart murmur    Hyperlipidemia    Hypertension    Myocardial infarction (H[facility])    Neuropathy    Peripheral vascular disease (H[facility])    Pneumonia    PONV (postoperative nausea and vomiting)    Restless legs    S/P TAVR (transcatheter aortic valve replacement) 10/21/2018   26 mm Edwards Sapien 3 Ultra transcatheter heart valve placed via percutaneous right transfemoral approach    Severe aortic stenosis    severe    Past Surgical History:  Procedure Laterality Date   ABDOMINAL AORTOGRAM W/LOWER EXTREMITY Bilateral 07/16/2018   Procedure: ABDOMINAL AORTOGRAM W/LOWER EXTREMITY;  Surgeon: Wellington Hampshire, MD;  Location: [place] [facility];  Service: Cardiovascular;  Laterality: Bilateral;   ABDOMINAL AORTOGRAM W/LOWER EXTREMITY N/A 04/09/2019   Procedure: ABDOMINAL AORTOGRAM W/LOWER EXTREMITY - Right Lower;  Surgeon: Waynetta Sandy, MD;  Location: [place] [facility];  Service: Cardiovascular;  Laterality: N/A;    AMPUTATION Left 08/19/2018   Procedure: AMPUTATION LEFT TOES SECOND AND THIRD;  Surgeon: Waynetta Sandy, MD;  Location: [place];  Service: Vascular;  Laterality: Left;   AMPUTATION Right 05/07/2019   Procedure: AMPUTATION BELOW KNEE;  Surgeon: Serafina Mitchell, MD;  Location: [place];  Service: Vascular;  Laterality: Right;   AV FISTULA PLACEMENT Right 12/27/2015   Procedure: Right Arm ARTERIOVENOUS (AV) FISTULA CREATION;  Surgeon: Angelia Mould, MD;  Location: [place];  Service: Vascular;  Laterality: Right;   CARDIAC CATHETERIZATION     CARDIAC VALVE REPLACEMENT     CATARACT EXTRACTION W/ INTRAOCULAR LENS  IMPLANT, BILATERAL     COLONOSCOPY     ERCP W/ SPHICTEROTOMY     EYE SURGERY     FEMORAL-POPLITEAL BYPASS GRAFT Left 07/22/2018   Procedure: left FEMORAL TO BELOW KNEE POPLITEAL ARTERY BYPASS GREAFT using 34mm removable ring propaten gore graft;  Surgeon: Waynetta Sandy, MD;  Location: [place];  Service: Vascular;  Laterality: Left;   FEMORAL-POPLITEAL BYPASS GRAFT Right 04/10/2019   Procedure: BYPASS GRAFT RIGHT FEMORAL-POPLITEAL ARTERY;  Surgeon: Rosetta Posner, MD;  Location: [place];  Service: Vascular;  Laterality: Right;   I & D EXTREMITY Right 05/05/2019   Procedure: Revision of  RIGHT  FOOT Amputation;  Surgeon: Waynetta Sandy, MD;  Location: [place];  Service: Vascular;  Laterality: Right;   INTRAOPERATIVE TRANSTHORACIC ECHOCARDIOGRAM N/A 10/21/2018   Procedure:  Intraoperative Transthoracic Echocardiogram;  Surgeon: Sherren Mocha, MD;  Location: [place];  Service: Open Heart Surgery;  Laterality: N/A;   IR GENERIC HISTORICAL  01/27/2016   IR FLUORO GUIDE CV LINE RIGHT 01/27/2016 Arne Cleveland, MD [facility]   IR GENERIC HISTORICAL  01/27/2016   IR US GUIDE VASC ACCESS RIGHT 01/27/2016 Arne Cleveland, MD [facility]   LAPAROSCOPIC CHOLECYSTECTOMY  03/2011   LEFT HEART CATH AND CORONARY ANGIOGRAPHY N/A 11/25/2017   Procedure: LEFT HEART CATH AND  CORONARY ANGIOGRAPHY;  Surgeon: Martinique, Peter M, MD;  Location: [place] [facility];  Service: Cardiovascular;  Laterality: N/A;   LEFT HEART CATH AND CORONARY ANGIOGRAPHY N/A 07/24/2018   Procedure: LEFT HEART CATH AND CORONARY ANGIOGRAPHY;  Surgeon: Sherren Mocha, MD;  Location: [place] [facility];  Service: Cardiovascular;  Laterality: N/A;   RENAL BIOPSY, PERCUTANEOUS Right 09/08/2014   RIGHT/LEFT HEART CATH AND CORONARY ANGIOGRAPHY N/A 03/25/2018   Procedure: RIGHT/LEFT HEART CATH AND CORONARY ANGIOGRAPHY;  Surgeon: Martinique, Peter M, MD;  Location: [place] [facility];  Service: Cardiovascular;  Laterality: N/A;   TEE WITHOUT CARDIOVERSION N/A 05/14/2019   Procedure: TRANSESOPHAGEAL ECHOCARDIOGRAM (TEE);  Surgeon: Fay Records, MD;  [facility];  Service: Cardiovascular;  Laterality: N/A;   TOE AMPUTATION Left 08/19/2018   2nd & 3rd toes   TONSILLECTOMY  ~ 1960   TRANSCATHETER AORTIC VALVE REPLACEMENT, TRANSFEMORAL N/A 10/21/2018   Procedure: TRANSCATHETER AORTIC VALVE REPLACEMENT, TRANSFEMORAL;  Surgeon: Sherren Mocha, MD;  Location: [place];  Service: Open Heart Surgery;  Laterality: N/A;   TRANSMETATARSAL AMPUTATION Right 04/29/2019   Procedure: RIGHT TRANSMETATARSAL AMPUTATION;  Surgeon: Waynetta Sandy, MD;  Location: [place];  Service: Vascular;  Laterality: Right;   ULTRASOUND GUIDANCE FOR VASCULAR ACCESS  03/25/2018   Procedure: Ultrasound Guidance For Vascular Access;  Surgeon: Martinique, Peter M, MD;  Location: [place] [facility];  Service: Cardiovascular;;    Family History  Problem Relation Age of Onset   Hypertension Mother    Diabetes Mother    Heart disease Mother    Skin cancer Mother    Hypertension Father    Diabetes Father    Diabetes Sister    Social History:  reports that he quit smoking about 9 years ago. His smoking use included cigarettes. He has a 80.00 pack-year smoking history. He has never used smokeless tobacco. He reports that he does  not drink alcohol and does not use drugs.  Allergies: No Known Allergies  Medications Prior to Admission  Medication Sig Dispense Refill   acetaminophen (TYLENOL) 325 MG tablet Take 1-2 tablets (325-650 mg total) by mouth every 4 (four) hours as needed for mild pain (or temp >/= 101 F).     amiodarone (PACERONE) 200 MG tablet TAKE 1 TABLET (200 MG TOTAL) BY MOUTH DAILY. 90 tablet 2   amLODipine (NORVASC) 10 MG tablet Take 1 tablet (10 mg total) by mouth at bedtime. 30 tablet 3   atorvastatin (LIPITOR) 80 MG tablet TAKE 1 TABLET BY MOUTH DAILY WITH SUPPER 90 tablet 3   B Complex-C-Folic Acid (RENA-VITE RX) 1 MG TABS Take 1 tablet by mouth once a day 90 tablet 3   clopidogrel (PLAVIX) 75 MG tablet TAKE 1 TABLET BY MOUTH DAILY WITH SUPPER 90 tablet 3   Darbepoetin Alfa (ARANESP) 100 MCG/0.5ML SOSY injection Inject 0.5 mLs (100 mcg total) into the vein every Friday with hemodialysis. 4.2 mL    docusate sodium (COLACE) 100 MG capsule Take 1 capsule (100  mg total) by mouth 2 (two) times daily. (Patient taking differently: Take 100 mg by mouth daily as needed for moderate constipation.) 10 capsule 0   lanthanum (FOSRENOL) 1000 MG chewable tablet Chew 2,000 mg by mouth 3 (three) times daily.     LORazepam (ATIVAN) 1 MG tablet Take 1 tablet (1 mg total) by mouth 2 (two) times daily as needed for anxiety. 20 tablet 0   ondansetron (ZOFRAN) 4 MG tablet TAKE 1 TABLET BY MOUTH TWICE A DAY AS NEEDED 60 tablet 0   oxyCODONE (OXYCONTIN) 10 mg 12 hr tablet Take 10 mg by mouth 2 (two) times daily as needed (pain).     Oxycodone HCl 10 MG TABS TAKE 1 TABLET BY MOUTH 4 TIMES A DAY AS NEEDED. 60 tablet 0   pantoprazole (PROTONIX) 40 MG tablet Take 1 tablet (40 mg total) by mouth daily. 30 tablet 0   vitamin B-12 (CYANOCOBALAMIN) 1000 MCG tablet Take 1 tablet (1,000 mcg total) by mouth every evening. 30 tablet 0   amoxicillin-clavulanate (AUGMENTIN) 500-125 MG tablet Take 1 tablet by mouth twice a day (in the morning  and at bedtime) for 7 days. 14 tablet 0   clopidogrel (PLAVIX) 75 MG tablet Take 1 tablet by mouth once a day (Patient not taking: Reported on 10/24/2020) 30 tablet 0   iron sucrose in sodium chloride 0.9 % 100 mL Iron Sucrose (Venofer)     Methoxy PEG-Epoetin Beta (MIRCERA IJ) Mircera     multivitamin (RENA-VIT) TABS tablet Take 1 tablet by mouth at bedtime. (Patient not taking: Reported on 10/24/2020) 30 tablet 0    Results for orders placed or performed during the hospital encounter of 11/02/20 (from the past 48 hour(s))  I-STAT, chem 8     Status: Abnormal   Collection Time: 11/02/20 12:44 PM  Result Value Ref Range   Sodium 137 135 - 145 mmol/L   Potassium 3.2 (L) 3.5 - 5.1 mmol/L   Chloride 94 (L) 98 - 111 mmol/L   BUN 23 8 - 23 mg/dL   Creatinine, Ser 6.50 (H) 0.61 - 1.24 mg/dL   Glucose, Bld 144 (H) 70 - 99 mg/dL    Comment: Glucose reference range applies only to samples taken after fasting for at least 8 hours.   Calcium, Ion 1.14 (L) 1.15 - 1.40 mmol/L   TCO2 32 22 - 32 mmol/L   Hemoglobin 8.5 (L) 13.0 - 17.0 g/dL   HCT 25.0 (L) 39.0 - 52.0 %   *Note: Due to a large number of results and/or encounters for the requested time period, some results have not been displayed. A complete set of results can be found in Results Review.   No results found.  Review of Systems  All other systems reviewed and are negative.  Blood pressure (!) 214/58, pulse 78, temperature 98.4 F (36.9 C), temperature source Oral, resp. rate 18, height $RemoveBe'5\' 10"'BFINIknFP$  (1.778 m), weight 77 kg, SpO2 97 %. Physical Exam Constitutional:      Appearance: Normal appearance.  HENT:     Head: Normocephalic and atraumatic.  Eyes:     Pupils: Pupils are equal, round, and reactive to light.  Cardiovascular:     Rate and Rhythm: Normal rate.  Pulmonary:     Effort: Pulmonary effort is normal.  Musculoskeletal:     Left wrist: Swelling, deformity and tenderness present.     Cervical back: Normal range of motion and  neck supple.     Comments: Left wrist dorsal snuffbox area  and volar radial artery area masses tender to deep palpation and nonpulsatile  Skin:    General: Skin is warm.  Neurological:     General: No focal deficit present.     Mental Status: He is alert and oriented to person, place, and time.  Psychiatric:        Mood and Affect: Mood normal.        Behavior: Behavior normal.        Thought Content: Thought content normal.        Judgment: Judgment normal.     Assessment/Plan 70 year old male with symptomatic dorsal and palmar masses left hand and wrist.  Have discussed the role of exploration excisional biopsy of these masses as an outpatient.  Patient understands risks and benefits and the fact we cannot guarantee complete pain relief and there is a probable recurrence rate of these masses.  He wishes to proceed to soon as possible as an outpatient.  Schuyler Amor, MD 11/02/2020, 12:56 PM

## 2020-11-03 ENCOUNTER — Emergency Department (HOSPITAL_COMMUNITY): Payer: Medicare Other

## 2020-11-03 ENCOUNTER — Inpatient Hospital Stay (HOSPITAL_COMMUNITY)
Admission: EM | Admit: 2020-11-03 | Discharge: 2020-11-14 | DRG: 040 | Disposition: A | Discharge status: Skilled Nursing Facility | Payer: Medicare Other | Attending: Family Medicine | Admitting: Family Medicine

## 2020-11-03 ENCOUNTER — Encounter (HOSPITAL_COMMUNITY): Payer: Medicare Other

## 2020-11-03 ENCOUNTER — Ambulatory Visit: Payer: Medicare Other

## 2020-11-03 ENCOUNTER — Other Ambulatory Visit: Payer: Self-pay

## 2020-11-03 ENCOUNTER — Encounter (HOSPITAL_COMMUNITY): Payer: Self-pay | Admitting: Orthopedic Surgery

## 2020-11-03 DIAGNOSIS — N2581 Secondary hyperparathyroidism of renal origin: Secondary | ICD-10-CM | POA: Diagnosis present

## 2020-11-03 DIAGNOSIS — I4891 Unspecified atrial fibrillation: Secondary | ICD-10-CM | POA: Diagnosis present

## 2020-11-03 DIAGNOSIS — I132 Hypertensive heart and chronic kidney disease with heart failure and with stage 5 chronic kidney disease, or end stage renal disease: Secondary | ICD-10-CM | POA: Diagnosis present

## 2020-11-03 DIAGNOSIS — Z79891 Long term (current) use of opiate analgesic: Secondary | ICD-10-CM

## 2020-11-03 DIAGNOSIS — Z8249 Family history of ischemic heart disease and other diseases of the circulatory system: Secondary | ICD-10-CM

## 2020-11-03 DIAGNOSIS — I2510 Atherosclerotic heart disease of native coronary artery without angina pectoris: Secondary | ICD-10-CM | POA: Diagnosis present

## 2020-11-03 DIAGNOSIS — I10 Essential (primary) hypertension: Secondary | ICD-10-CM | POA: Diagnosis present

## 2020-11-03 DIAGNOSIS — N186 End stage renal disease: Secondary | ICD-10-CM | POA: Diagnosis present

## 2020-11-03 DIAGNOSIS — Z89422 Acquired absence of other left toe(s): Secondary | ICD-10-CM

## 2020-11-03 DIAGNOSIS — G629 Polyneuropathy, unspecified: Secondary | ICD-10-CM | POA: Diagnosis present

## 2020-11-03 DIAGNOSIS — Z79899 Other long term (current) drug therapy: Secondary | ICD-10-CM

## 2020-11-03 DIAGNOSIS — Z89511 Acquired absence of right leg below knee: Secondary | ICD-10-CM

## 2020-11-03 DIAGNOSIS — Z87891 Personal history of nicotine dependence: Secondary | ICD-10-CM

## 2020-11-03 DIAGNOSIS — R4182 Altered mental status, unspecified: Secondary | ICD-10-CM | POA: Diagnosis present

## 2020-11-03 DIAGNOSIS — J189 Pneumonia, unspecified organism: Secondary | ICD-10-CM

## 2020-11-03 DIAGNOSIS — I252 Old myocardial infarction: Secondary | ICD-10-CM

## 2020-11-03 DIAGNOSIS — G9341 Metabolic encephalopathy: Secondary | ICD-10-CM

## 2020-11-03 DIAGNOSIS — Z20822 Contact with and (suspected) exposure to covid-19: Secondary | ICD-10-CM | POA: Diagnosis present

## 2020-11-03 DIAGNOSIS — I5042 Chronic combined systolic (congestive) and diastolic (congestive) heart failure: Secondary | ICD-10-CM | POA: Diagnosis present

## 2020-11-03 DIAGNOSIS — K219 Gastro-esophageal reflux disease without esophagitis: Secondary | ICD-10-CM | POA: Diagnosis present

## 2020-11-03 DIAGNOSIS — I739 Peripheral vascular disease, unspecified: Secondary | ICD-10-CM | POA: Diagnosis present

## 2020-11-03 DIAGNOSIS — R5381 Other malaise: Secondary | ICD-10-CM | POA: Diagnosis present

## 2020-11-03 DIAGNOSIS — E859 Amyloidosis, unspecified: Secondary | ICD-10-CM | POA: Diagnosis present

## 2020-11-03 DIAGNOSIS — T402X5A Adverse effect of other opioids, initial encounter: Secondary | ICD-10-CM | POA: Diagnosis present

## 2020-11-03 DIAGNOSIS — Z953 Presence of xenogenic heart valve: Secondary | ICD-10-CM

## 2020-11-03 DIAGNOSIS — D631 Anemia in chronic kidney disease: Secondary | ICD-10-CM | POA: Diagnosis present

## 2020-11-03 DIAGNOSIS — C9000 Multiple myeloma not having achieved remission: Secondary | ICD-10-CM | POA: Diagnosis present

## 2020-11-03 DIAGNOSIS — G928 Other toxic encephalopathy: Principal | ICD-10-CM | POA: Diagnosis present

## 2020-11-03 DIAGNOSIS — R2232 Localized swelling, mass and lump, left upper limb: Secondary | ICD-10-CM | POA: Diagnosis present

## 2020-11-03 DIAGNOSIS — E782 Mixed hyperlipidemia: Secondary | ICD-10-CM | POA: Diagnosis present

## 2020-11-03 DIAGNOSIS — Z7902 Long term (current) use of antithrombotics/antiplatelets: Secondary | ICD-10-CM

## 2020-11-03 DIAGNOSIS — T424X5A Adverse effect of benzodiazepines, initial encounter: Secondary | ICD-10-CM | POA: Diagnosis present

## 2020-11-03 DIAGNOSIS — Z992 Dependence on renal dialysis: Secondary | ICD-10-CM

## 2020-11-03 DIAGNOSIS — R410 Disorientation, unspecified: Secondary | ICD-10-CM

## 2020-11-03 DIAGNOSIS — F419 Anxiety disorder, unspecified: Secondary | ICD-10-CM | POA: Diagnosis present

## 2020-11-03 LAB — CBC WITH DIFFERENTIAL/PLATELET
Abs Immature Granulocytes: 0.04 10*3/uL (ref 0.00–0.07)
Basophils Absolute: 0 10*3/uL (ref 0.0–0.1)
Basophils Relative: 0 %
Eosinophils Absolute: 0 10*3/uL (ref 0.0–0.5)
Eosinophils Relative: 0 %
HCT: 28.8 % — ABNORMAL LOW (ref 39.0–52.0)
Hemoglobin: 9.3 g/dL — ABNORMAL LOW (ref 13.0–17.0)
Immature Granulocytes: 1 %
Lymphocytes Relative: 14 %
Lymphs Abs: 1.1 10*3/uL (ref 0.7–4.0)
MCH: 31.2 pg (ref 26.0–34.0)
MCHC: 32.3 g/dL (ref 30.0–36.0)
MCV: 96.6 fL (ref 80.0–100.0)
Monocytes Absolute: 0.5 10*3/uL (ref 0.1–1.0)
Monocytes Relative: 6 %
Neutro Abs: 6.3 10*3/uL (ref 1.7–7.7)
Neutrophils Relative %: 79 %
Platelets: 133 10*3/uL — ABNORMAL LOW (ref 150–400)
RBC: 2.98 MIL/uL — ABNORMAL LOW (ref 4.22–5.81)
RDW: 16.1 % — ABNORMAL HIGH (ref 11.5–15.5)
WBC: 7.9 10*3/uL (ref 4.0–10.5)
nRBC: 0 % (ref 0.0–0.2)

## 2020-11-03 LAB — COMPREHENSIVE METABOLIC PANEL
ALT: 7 U/L (ref 0–44)
AST: 19 U/L (ref 15–41)
Albumin: 3.7 g/dL (ref 3.5–5.0)
Alkaline Phosphatase: 51 U/L (ref 38–126)
Anion gap: 17 — ABNORMAL HIGH (ref 5–15)
BUN: 40 mg/dL — ABNORMAL HIGH (ref 8–23)
CO2: 27 mmol/L (ref 22–32)
Calcium: 9.6 mg/dL (ref 8.9–10.3)
Chloride: 93 mmol/L — ABNORMAL LOW (ref 98–111)
Creatinine, Ser: 9.05 mg/dL — ABNORMAL HIGH (ref 0.61–1.24)
GFR, Estimated: 6 mL/min — ABNORMAL LOW (ref >60)
Glucose, Bld: 106 mg/dL — ABNORMAL HIGH (ref 70–99)
Potassium: 3.7 mmol/L (ref 3.5–5.1)
Sodium: 137 mmol/L (ref 135–145)
Total Bilirubin: 0.7 mg/dL (ref 0.3–1.2)
Total Protein: 6.6 g/dL (ref 6.5–8.1)

## 2020-11-03 LAB — ETHANOL: Alcohol, Ethyl (B): <10 mg/dL (ref <10)

## 2020-11-03 MED ORDER — HYDRALAZINE HCL 25 MG PO TABS
25.0000 mg | ORAL_TABLET | Freq: Once | ORAL | Status: AC
  Administered 2020-11-03: 25 mg via ORAL
  Filled 2020-11-03: qty 1

## 2020-11-03 NOTE — Discharge Instructions (Addendum)
Your CT image and test did not show any worrisome findings.  Cut back on your pain medication use at home.  Follow-up with your doctor in 2 or 3 days.  Return if you have fevers worsening symptoms or any additional concerns.

## 2020-11-03 NOTE — ED Notes (Signed)
Daughter called and on her way to pick up pt

## 2020-11-03 NOTE — ED Provider Notes (Addendum)
[facility] EMERGENCY DEPARTMENT Provider Note   CSN: [id number] Arrival date & time: 11/03/20  2007     History Chief Complaint  Patient presents with   Altered Mental Status    Jose Garrett is a 70 y.o. male.  Patient history of TAVR, end-stage kidney disease on dialysis Monday Wednesday Friday, recently discharged after cyst removal from the left upper extremity 2 days ago, presents with concern for hallucination at home.  Patient states that he "feels fine."  However daughter is concerned that she felt like he was talking to the TV at home.  When I discussed this with the patient he states he was not really talking to the TV.  He otherwise denies any headache or chest pain abdominal pain.  No reports of fevers or cough or vomiting or diarrhea states he has no pain or discomfort at this time.  He recently had surgery on his left upper extremity has been taking OxyContin today.      Past Medical History:  Diagnosis Date   Amyloidosis ([place])    Anemia of chronic disease    Anxiety    CAD (coronary artery disease)    a. cardiac cath on 11/25/17 with occlusion of the RCA which could have been his event but the RCA could not be engaged. The RCA filled distally from left to right collaterals. There were no severe stenoses in the left coronary system. Medical therapy recommended, started on plavix.   [facility])    multiple myeloma   Chronic combined systolic and diastolic CHF (congestive heart failure) ([place])    a. EF dropped to 35-40% with grade 2 DD by echo 11/2017.   DJD (degenerative joint disease), lumbar    Dysrhythmia    afib   ESRD (end stage renal disease) ([place])    ESRD- HEMO MWF- [place]   GERD (gastroesophageal reflux disease)    Heart murmur    Hyperlipidemia    Hypertension    Myocardial infarction (HCC)    Neuropathy    Peripheral vascular disease (HCC)    Pneumonia    PONV (postoperative nausea and vomiting)    Restless legs    S/P TAVR  (transcatheter aortic valve replacement) 10/21/2018   26 mm Edwards Sapien 3 Ultra transcatheter heart valve placed via percutaneous right transfemoral approach    Severe aortic stenosis    severe    Patient Active Problem List   Diagnosis Date Noted   Volume overload 10/16/2020   Acute hypoxemic respiratory failure ([place]) 10/16/2020   Hypertensive urgency 10/16/2020   Pain due to onychomycosis of toenail of left foot 08/30/2020   Lumbar spondylosis 01/23/2020   Neuropathy 10/06/2019   Gait abnormality 08/06/2019   Back muscle spasm 07/09/2019   Sleep disturbance    Nausea without vomiting    Orthostatic hypotension    Acute on chronic anemia    Chronic combined systolic and diastolic congestive heart failure ([place])    ESRD on dialysis ([place])    Right below-knee amputee ([place]) 05/18/2019   Pressure injury of skin 05/14/2019   Enterococcal bacteremia 05/13/2019   Headache, unspecified 01/12/2019   Allergy, unspecified, initial encounter 12/29/2018   Polymorphic ventricular tachycardia ([place]) 10/24/2018   S/P TAVR (transcatheter aortic valve replacement) 10/21/2018   Anxiety    Coronary artery disease involving native coronary artery of native heart without angina pectoris    Thrombocytopenia (HCC)    PAD (peripheral artery disease) ([place]) 07/22/2018   Acute on chronic combined  systolic and diastolic CHF (congestive heart failure) ([place]) 11/30/2017   Hypercalcemia 06/24/2017   Encounter for removal of sutures 08/15/2016   Other mechanical complication of surgically created arteriovenous fistula, subsequent encounter 06/22/2016   Unspecified protein-calorie malnutrition ([place]) 02/06/2016   Secondary hyperparathyroidism of renal origin ([place]) 02/06/2016   Pruritus, unspecified 02/06/2016   Other specified coagulation defects ([place]) 02/06/2016   Hypokalemia 02/06/2016   Diarrhea, unspecified 02/63/7858   Complication of vascular dialysis catheter 02/06/2016   Aftercare including  intermittent dialysis ([place]) 02/06/2016   ESRD (end stage renal disease) on dialysis [facility])    Iron deficiency anemia 11/22/2015   AL amyloidosis ([place]) 09/25/2014   Anemia of chronic disease 09/25/2014   Essential hypertension, benign 11/06/2013    Past Surgical History:  Procedure Laterality Date   ABDOMINAL AORTOGRAM W/LOWER EXTREMITY Bilateral 07/16/2018   Procedure: ABDOMINAL AORTOGRAM W/LOWER EXTREMITY;  Surgeon: Wellington Hampshire, MD;  Location: [place] CV [facility];  Service: Cardiovascular;  Laterality: Bilateral;   ABDOMINAL AORTOGRAM W/LOWER EXTREMITY N/A 04/09/2019   Procedure: ABDOMINAL AORTOGRAM W/LOWER EXTREMITY - Right Lower;  Surgeon: Waynetta Sandy, MD;  Location: [place] [facility];  Service: Cardiovascular;  Laterality: N/A;   AMPUTATION Left 08/19/2018   Procedure: AMPUTATION LEFT TOES SECOND AND THIRD;  Surgeon: Waynetta Sandy, MD;  Location: [place];  Service: Vascular;  Laterality: Left;   AMPUTATION Right 05/07/2019   Procedure: AMPUTATION BELOW KNEE;  Surgeon: Serafina Mitchell, MD;  Location: [place];  Service: Vascular;  Laterality: Right;   AV FISTULA PLACEMENT Right 12/27/2015   Procedure: Right Arm ARTERIOVENOUS (AV) FISTULA CREATION;  Surgeon: Angelia Mould, MD;  Location: [place];  Service: Vascular;  Laterality: Right;   CARDIAC CATHETERIZATION     CARDIAC VALVE REPLACEMENT     CATARACT EXTRACTION W/ INTRAOCULAR LENS  IMPLANT, BILATERAL     COLONOSCOPY     ERCP W/ SPHICTEROTOMY     EYE SURGERY     FEMORAL-POPLITEAL BYPASS GRAFT Left 07/22/2018   Procedure: left FEMORAL TO BELOW KNEE POPLITEAL ARTERY BYPASS GREAFT using 41m removable ring propaten gore graft;  Surgeon: CWaynetta Sandy MD;  [place]  Service: Vascular;  Laterality: Left;   FEMORAL-POPLITEAL BYPASS GRAFT Right 04/10/2019   Procedure: BYPASS GRAFT RIGHT FEMORAL-POPLITEAL ARTERY;  Surgeon: ERosetta Posner MD;  [place]  Service: Vascular;   Laterality: Right;   GANGLION CYST EXCISION Left 11/02/2020   Procedure: EXCISION OF LEFT WRIST DORAL VOLAR MASSES;  Surgeon: WCharlotte Crumb MD;  Location: [place]  Service: Orthopedics;  Laterality: Left;  CPT 25073   I & D EXTREMITY Right 05/05/2019   Procedure: Revision of  RIGHT  FOOT Amputation;  Surgeon: CWaynetta Sandy MD;  [place]  Service: Vascular;  Laterality: Right;   INTRAOPERATIVE TRANSTHORACIC ECHOCARDIOGRAM N/A 10/21/2018   Procedure: Intraoperative Transthoracic Echocardiogram;  Surgeon: CSherren Mocha MD;  Location: [place]  Service: Open Heart Surgery;  Laterality: N/A;   IR GENERIC HISTORICAL  01/27/2016   IR FLUORO GUIDE CV LINE RIGHT 01/27/2016 DArne Cleveland MD [facility]   IR GENERIC HISTORICAL  01/27/2016   IR [place] VASC ACCESS RIGHT 01/27/2016 DArne Cleveland MD [facility]   LAPAROSCOPIC CHOLECYSTECTOMY  03/2011   LEFT HEART CATH AND CORONARY ANGIOGRAPHY N/A 11/25/2017   Procedure: LEFT HEART CATH AND CORONARY ANGIOGRAPHY;  Surgeon: JMartinique Peter M, MD;  [facility];  Service: Cardiovascular;  Laterality: N/A;   LEFT HEART CATH AND  CORONARY ANGIOGRAPHY N/A 07/24/2018   Procedure: LEFT HEART CATH AND CORONARY ANGIOGRAPHY;  Surgeon: Sherren Mocha, MD;  Location: [place] [facility];  Service: Cardiovascular;  Laterality: N/A;   RENAL BIOPSY, PERCUTANEOUS Right 09/08/2014   RIGHT/LEFT HEART CATH AND CORONARY ANGIOGRAPHY N/A 03/25/2018   Procedure: RIGHT/LEFT HEART CATH AND CORONARY ANGIOGRAPHY;  Surgeon: Martinique, Peter M, MD;  Location: [place] [facility];  Service: Cardiovascular;  Laterality: N/A;   TEE WITHOUT CARDIOVERSION N/A 05/14/2019   Procedure: TRANSESOPHAGEAL ECHOCARDIOGRAM (TEE);  Surgeon: Fay Records, MD;  Location: [facility] ENDOSCOPY;  Service: Cardiovascular;  Laterality: N/A;   TOE AMPUTATION Left 08/19/2018   2nd & 3rd toes   TONSILLECTOMY  ~ 1960   TRANSCATHETER AORTIC VALVE REPLACEMENT, TRANSFEMORAL N/A  10/21/2018   Procedure: TRANSCATHETER AORTIC VALVE REPLACEMENT, TRANSFEMORAL;  Surgeon: Sherren Mocha, MD;  Location: [name];  Service: Open Heart Surgery;  Laterality: N/A;   TRANSMETATARSAL AMPUTATION Right 04/29/2019   Procedure: RIGHT TRANSMETATARSAL AMPUTATION;  Surgeon: Waynetta Sandy, MD;  Location: [place];  Service: Vascular;  Laterality: Right;   ULTRASOUND GUIDANCE FOR VASCULAR ACCESS  03/25/2018   Procedure: Ultrasound Guidance For Vascular Access;  Surgeon: Martinique, Peter M, MD;  Location: [place] [facility];  Service: Cardiovascular;;       Family History  Problem Relation Age of Onset   Hypertension Mother    Diabetes Mother    Heart disease Mother    Skin cancer Mother    Hypertension Father    Diabetes Father    Diabetes Sister     Social History   Tobacco Use   Smoking status: Former    Packs/day: 2.00    Years: 40.00    Pack years: 80.00    Types: Cigarettes    Quit date: 03/19/2011    Years since quitting: 9.6   Smokeless tobacco: Never  Vaping Use   Vaping Use: Never used  Substance Use Topics   Alcohol use: No    Alcohol/week: 0.0 standard drinks   Drug use: No    Comment:  "I have used marijuana till the 1990's". Notes that he quit 15 yrs ago    Home Medications Prior to Admission medications   Medication Sig Start Date End Date Taking? Authorizing Provider  acetaminophen (TYLENOL) 325 MG tablet Take 1-2 tablets (325-650 mg total) by mouth every 4 (four) hours as needed for mild pain (or temp >/= 101 F). 05/27/19   Angiulli, Lavon Paganini, PA-C  amiodarone (PACERONE) 200 MG tablet TAKE 1 TABLET (200 MG TOTAL) BY MOUTH DAILY. 09/07/20 09/07/21  Burnell Blanks, MD  amLODipine (NORVASC) 10 MG tablet Take 1 tablet (10 mg total) by mouth at bedtime. 10/18/20   Oswald Hillock, MD  atorvastatin (LIPITOR) 80 MG tablet TAKE 1 TABLET BY MOUTH DAILY WITH SUPPER 04/28/20 04/28/21  Burnell Blanks, MD  B Complex-C-Folic Acid (RENA-VITE RX) 1 MG  TABS Take 1 tablet by mouth once a day 10/19/20     clopidogrel (PLAVIX) 75 MG tablet TAKE 1 TABLET BY MOUTH DAILY WITH SUPPER 04/28/20 04/28/21  Burnell Blanks, MD  clopidogrel (PLAVIX) 75 MG tablet Take 1 tablet by mouth once a day Patient not taking: Reported on 10/24/2020 10/19/20     Darbepoetin Alfa (ARANESP) 100 MCG/0.5ML SOSY injection Inject 0.5 mLs (100 mcg total) into the vein every Friday with hemodialysis. 05/29/19   Angiulli, Lavon Paganini, PA-C  docusate sodium (COLACE) 100 MG capsule Take 1 capsule (100 mg total) by mouth  2 (two) times daily. Patient taking differently: Take 100 mg by mouth daily as needed for moderate constipation. 05/27/19   Angiulli, Lavon Paganini, PA-C  iron sucrose in sodium chloride 0.9 % 100 mL Iron Sucrose (Venofer) 07/27/20 07/26/21  [provider]  lanthanum (FOSRENOL) 1000 MG chewable tablet Chew 2,000 mg by mouth 3 (three) times daily. 07/15/20   [provider]  LORazepam (ATIVAN) 1 MG tablet Take 1 tablet (1 mg total) by mouth 2 (two) times daily as needed for anxiety. 05/27/19   Angiulli, Lavon Paganini, PA-C  Methoxy PEG-Epoetin Beta (MIRCERA IJ) Mircera 06/08/20 06/07/21  [provider]  multivitamin (RENA-VIT) TABS tablet Take 1 tablet by mouth at bedtime. Patient not taking: Reported on 10/24/2020 08/12/18   Cathlyn Parsons, PA-C  ondansetron (ZOFRAN) 4 MG tablet TAKE 1 TABLET BY MOUTH TWICE A DAY AS NEEDED 06/28/20 06/28/21  Wellington Hampshire, MD  oxyCODONE (OXYCONTIN) 10 mg 12 hr tablet Take 10 mg by mouth 2 (two) times daily as needed (pain).    [provider]  Oxycodone HCl 10 MG TABS TAKE 1 TABLET BY MOUTH 4 TIMES A DAY AS NEEDED. 10/13/20     oxyCODONE-acetaminophen (PERCOCET) 5-325 MG tablet Take 1 tablet by mouth every 4 (four) hours as needed for severe pain. 11/02/20 11/02/21  Charlotte Crumb, MD  pantoprazole (PROTONIX) 40 MG tablet Take 1 tablet (40 mg total) by mouth daily. 05/28/19   Angiulli, Lavon Paganini, PA-C  vitamin  B-12 (CYANOCOBALAMIN) 1000 MCG tablet Take 1 tablet (1,000 mcg total) by mouth every evening. 05/27/19   Angiulli, Lavon Paganini, PA-C    Allergies    Patient has no known allergies.  Review of Systems   Review of Systems  Constitutional:  Negative for fever.  HENT:  Negative for ear pain and sore throat.   Eyes:  Negative for pain.  Respiratory:  Negative for cough.   Cardiovascular:  Negative for chest pain.  Gastrointestinal:  Negative for abdominal pain.  Genitourinary:  Negative for flank pain.  Musculoskeletal:  Negative for back pain.  Skin:  Negative for color change and rash.  Neurological:  Negative for syncope.  All other systems reviewed and are negative.  Physical Exam Updated Vital Signs BP (!) 188/71 (BP Location: Left Arm)   Pulse 94   Temp 97.9 F (36.6 C)   Resp 18   SpO2 98%   Physical Exam Constitutional:      Appearance: He is well-developed.  HENT:     Head: Normocephalic.     Nose: Nose normal.  Eyes:     Extraocular Movements: Extraocular movements intact.  Cardiovascular:     Rate and Rhythm: Normal rate.  Pulmonary:     Effort: Pulmonary effort is normal.  Skin:    Coloration: Skin is not jaundiced.  Neurological:     General: No focal deficit present.     Mental Status: He is alert and oriented to person, place, and time. Mental status is at baseline.     Cranial Nerves: No cranial nerve deficit.     Motor: No weakness.     Comments: Patient appears comfortable awake alert oriented x3.  No auditory or visual hallucinations per patient.    ED Results / Procedures / Treatments   Labs (all labs ordered are listed, but only abnormal results are displayed) Labs Reviewed  CBC WITH DIFFERENTIAL/PLATELET - Abnormal; Notable for the following components:      Result Value   RBC 2.98 (*)  Hemoglobin 9.3 (*)    HCT 28.8 (*)    RDW 16.1 (*)    Platelets 133 (*)    All other components within normal limits  COMPREHENSIVE METABOLIC PANEL -  Abnormal; Notable for the following components:   Chloride 93 (*)    Glucose, Bld 106 (*)    BUN 40 (*)    Creatinine, Ser 9.05 (*)    GFR, Estimated 6 (*)    Anion gap 17 (*)    All other components within normal limits  ETHANOL    EKG None  Radiology CT Head Wo Contrast  Result Date: 11/03/2020 CLINICAL DATA:  Mental status changes EXAM: CT HEAD WITHOUT CONTRAST TECHNIQUE: Contiguous axial images were obtained from the base of the skull through the vertex without intravenous contrast. COMPARISON:  None. FINDINGS: Brain: No acute intracranial abnormality. Specifically, no hemorrhage, hydrocephalus, mass lesion, acute infarction, or significant intracranial injury. Vascular: No hyperdense vessel or unexpected calcification. Skull: No acute calvarial abnormality. Sinuses/Orbits: No acute findings Other: None IMPRESSION: No acute intracranial abnormality. Electronically Signed   By: Rolm Baptise M.D.   On: 11/03/2020 22:25    Procedures Procedures   Medications Ordered in ED Medications  hydrALAZINE (APRESOLINE) tablet 25 mg (25 mg Oral Given 11/03/20 2149)    ED Course  I have reviewed the triage vital signs and the nursing notes.  Pertinent labs & imaging results that were available during my care of the patient were reviewed by me and considered in my medical decision making (see chart for details).    MDM Rules/Calculators/A&P                           Labs are unremarkable hemoglobin at his baseline.  Elevated creatinine similar to prior levels.  CT imaging shows no acute findings.  Patient blood pressure elevated here with improvement with hydralazine.  Initially suspected narcotic induced confusion.  He is been taking Percocet at home.  However the daughter states that he is continue to have intermittent episodes of confusion such as handing her things that are not there or talking about people that are not there.  When I come back to speak with the patient he denies  the symptoms but daughter has been sitting next to him and states that he will intermittently act bizarrely which is not normal for him.  Will be brought into the hospitalist team for further observation.  Final Clinical Impression(s) / ED Diagnoses Final diagnoses:  Confusion    Rx / DC Orders ED Discharge Orders     None        Luna Fuse, MD 11/03/20 2249    Luna Fuse, MD 11/04/20 0005

## 2020-11-03 NOTE — ED Triage Notes (Signed)
Pt had a cyst removal from the L arm yesterday at 1pm, has been taking pain medication since surgery (one pill at 5pm yesterday, and 10 am today). Daughter reported to EMS that pt has been hallucinating at home (talking to the tv). Pt is oriented to orientation questions.

## 2020-11-04 ENCOUNTER — Encounter (HOSPITAL_COMMUNITY): Payer: Self-pay | Admitting: Internal Medicine

## 2020-11-04 ENCOUNTER — Observation Stay (HOSPITAL_COMMUNITY): Payer: Medicare Other

## 2020-11-04 DIAGNOSIS — I2510 Atherosclerotic heart disease of native coronary artery without angina pectoris: Secondary | ICD-10-CM

## 2020-11-04 DIAGNOSIS — K219 Gastro-esophageal reflux disease without esophagitis: Secondary | ICD-10-CM

## 2020-11-04 DIAGNOSIS — E782 Mixed hyperlipidemia: Secondary | ICD-10-CM | POA: Diagnosis present

## 2020-11-04 DIAGNOSIS — G9341 Metabolic encephalopathy: Secondary | ICD-10-CM | POA: Diagnosis not present

## 2020-11-04 DIAGNOSIS — I5042 Chronic combined systolic (congestive) and diastolic (congestive) heart failure: Secondary | ICD-10-CM | POA: Diagnosis not present

## 2020-11-04 DIAGNOSIS — N186 End stage renal disease: Secondary | ICD-10-CM

## 2020-11-04 DIAGNOSIS — Z992 Dependence on renal dialysis: Secondary | ICD-10-CM

## 2020-11-04 DIAGNOSIS — I10 Essential (primary) hypertension: Secondary | ICD-10-CM

## 2020-11-04 LAB — CBC WITH DIFFERENTIAL/PLATELET
Abs Immature Granulocytes: 0.05 10*3/uL (ref 0.00–0.07)
Basophils Absolute: 0 10*3/uL (ref 0.0–0.1)
Basophils Relative: 0 %
Eosinophils Absolute: 0 10*3/uL (ref 0.0–0.5)
Eosinophils Relative: 0 %
HCT: 29 % — ABNORMAL LOW (ref 39.0–52.0)
Hemoglobin: 9.3 g/dL — ABNORMAL LOW (ref 13.0–17.0)
Immature Granulocytes: 1 %
Lymphocytes Relative: 15 %
Lymphs Abs: 1.4 10*3/uL (ref 0.7–4.0)
MCH: 31.2 pg (ref 26.0–34.0)
MCHC: 32.1 g/dL (ref 30.0–36.0)
MCV: 97.3 fL (ref 80.0–100.0)
Monocytes Absolute: 0.4 10*3/uL (ref 0.1–1.0)
Monocytes Relative: 5 %
Neutro Abs: 7.5 10*3/uL (ref 1.7–7.7)
Neutrophils Relative %: 79 %
Platelets: 129 10*3/uL — ABNORMAL LOW (ref 150–400)
RBC: 2.98 MIL/uL — ABNORMAL LOW (ref 4.22–5.81)
RDW: 16.1 % — ABNORMAL HIGH (ref 11.5–15.5)
WBC: 9.4 10*3/uL (ref 4.0–10.5)
nRBC: 0.2 % (ref 0.0–0.2)

## 2020-11-04 LAB — PROCALCITONIN: Procalcitonin: 0.2 ng/mL

## 2020-11-04 LAB — I-STAT VENOUS BLOOD GAS, ED
Acid-Base Excess: 6 mmol/L — ABNORMAL HIGH (ref 0.0–2.0)
Bicarbonate: 29.9 mmol/L — ABNORMAL HIGH (ref 20.0–28.0)
Calcium, Ion: 1.04 mmol/L — ABNORMAL LOW (ref 1.15–1.40)
HCT: 27 % — ABNORMAL LOW (ref 39.0–52.0)
Hemoglobin: 9.2 g/dL — ABNORMAL LOW (ref 13.0–17.0)
O2 Saturation: 96 %
Potassium: 3.5 mmol/L (ref 3.5–5.1)
Sodium: 137 mmol/L (ref 135–145)
TCO2: 31 mmol/L (ref 22–32)
pCO2, Ven: 39.3 mmHg — ABNORMAL LOW (ref 44.0–60.0)
pH, Ven: 7.489 — ABNORMAL HIGH (ref 7.250–7.430)
pO2, Ven: 75 mmHg — ABNORMAL HIGH (ref 32.0–45.0)

## 2020-11-04 LAB — VITAMIN B12: Vitamin B-12: 264 pg/mL (ref 180–914)

## 2020-11-04 LAB — COMPREHENSIVE METABOLIC PANEL
ALT: 6 U/L (ref 0–44)
AST: 18 U/L (ref 15–41)
Albumin: 3.5 g/dL (ref 3.5–5.0)
Alkaline Phosphatase: 50 U/L (ref 38–126)
Anion gap: 19 — ABNORMAL HIGH (ref 5–15)
BUN: 42 mg/dL — ABNORMAL HIGH (ref 8–23)
CO2: 25 mmol/L (ref 22–32)
Calcium: 9.5 mg/dL (ref 8.9–10.3)
Chloride: 94 mmol/L — ABNORMAL LOW (ref 98–111)
Creatinine, Ser: 9.36 mg/dL — ABNORMAL HIGH (ref 0.61–1.24)
GFR, Estimated: 6 mL/min — ABNORMAL LOW (ref >60)
Glucose, Bld: 102 mg/dL — ABNORMAL HIGH (ref 70–99)
Potassium: 3.5 mmol/L (ref 3.5–5.1)
Sodium: 138 mmol/L (ref 135–145)
Total Bilirubin: 0.7 mg/dL (ref 0.3–1.2)
Total Protein: 6.8 g/dL (ref 6.5–8.1)

## 2020-11-04 LAB — C-REACTIVE PROTEIN: CRP: 4.7 mg/dL — ABNORMAL HIGH (ref <1.0)

## 2020-11-04 LAB — SURGICAL PATHOLOGY

## 2020-11-04 LAB — TSH: TSH: 0.829 u[IU]/mL (ref 0.350–4.500)

## 2020-11-04 LAB — AMMONIA: Ammonia: 14 umol/L (ref 9–35)

## 2020-11-04 LAB — BRAIN NATRIURETIC PEPTIDE: B Natriuretic Peptide: 784.8 pg/mL — ABNORMAL HIGH (ref 0.0–100.0)

## 2020-11-04 LAB — RESP PANEL BY RT-PCR (FLU A&B, COVID) ARPGX2
Influenza A by PCR: NEGATIVE
Influenza B by PCR: NEGATIVE
SARS Coronavirus 2 by RT PCR: NEGATIVE

## 2020-11-04 LAB — MAGNESIUM: Magnesium: 2.1 mg/dL (ref 1.7–2.4)

## 2020-11-04 LAB — HIV ANTIBODY (ROUTINE TESTING W REFLEX): HIV Screen 4th Generation wRfx: NONREACTIVE

## 2020-11-04 LAB — PHOSPHORUS: Phosphorus: 8.4 mg/dL — ABNORMAL HIGH (ref 2.5–4.6)

## 2020-11-04 LAB — FOLATE: Folate: 15.9 ng/mL (ref >5.9)

## 2020-11-04 MED ORDER — ONDANSETRON HCL 4 MG/2ML IJ SOLN
4.0000 mg | Freq: Four times a day (QID) | INTRAMUSCULAR | Status: DC | PRN
  Administered 2020-11-05: 4 mg via INTRAVENOUS
  Filled 2020-11-04: qty 2

## 2020-11-04 MED ORDER — PANTOPRAZOLE SODIUM 40 MG PO TBEC
40.0000 mg | DELAYED_RELEASE_TABLET | Freq: Every day | ORAL | Status: DC
  Administered 2020-11-04 – 2020-11-12 (×9): 40 mg via ORAL
  Filled 2020-11-04 (×5): qty 1
  Filled 2020-11-04: qty 2
  Filled 2020-11-04 (×4): qty 1

## 2020-11-04 MED ORDER — HYDRALAZINE HCL 20 MG/ML IJ SOLN
10.0000 mg | Freq: Four times a day (QID) | INTRAMUSCULAR | Status: DC | PRN
  Administered 2020-11-05 – 2020-11-11 (×2): 10 mg via INTRAVENOUS
  Filled 2020-11-04 (×2): qty 1

## 2020-11-04 MED ORDER — LANTHANUM CARBONATE 500 MG PO CHEW
2000.0000 mg | CHEWABLE_TABLET | Freq: Three times a day (TID) | ORAL | Status: DC
  Administered 2020-11-04 – 2020-11-14 (×22): 2000 mg via ORAL
  Filled 2020-11-04 (×35): qty 4

## 2020-11-04 MED ORDER — CHLORHEXIDINE GLUCONATE CLOTH 2 % EX PADS
6.0000 | MEDICATED_PAD | Freq: Every day | CUTANEOUS | Status: DC
  Administered 2020-11-05 – 2020-11-14 (×7): 6 via TOPICAL

## 2020-11-04 MED ORDER — SODIUM CHLORIDE 0.9 % IV SOLN: INTRAVENOUS | Status: DC

## 2020-11-04 MED ORDER — ATORVASTATIN CALCIUM 80 MG PO TABS
80.0000 mg | ORAL_TABLET | Freq: Every day | ORAL | Status: DC
  Administered 2020-11-04 – 2020-11-14 (×9): 80 mg via ORAL
  Filled 2020-11-04 (×12): qty 1

## 2020-11-04 MED ORDER — CLOPIDOGREL BISULFATE 75 MG PO TABS
75.0000 mg | ORAL_TABLET | Freq: Every day | ORAL | Status: DC
  Administered 2020-11-04 – 2020-11-14 (×11): 75 mg via ORAL
  Filled 2020-11-04 (×11): qty 1

## 2020-11-04 MED ORDER — AMLODIPINE BESYLATE 10 MG PO TABS
10.0000 mg | ORAL_TABLET | Freq: Every day | ORAL | Status: DC
  Administered 2020-11-04 – 2020-11-14 (×10): 10 mg via ORAL
  Filled 2020-11-04 (×4): qty 1
  Filled 2020-11-04: qty 2
  Filled 2020-11-04 (×3): qty 1
  Filled 2020-11-04: qty 2
  Filled 2020-11-04 (×2): qty 1

## 2020-11-04 MED ORDER — AMIODARONE HCL 200 MG PO TABS
200.0000 mg | ORAL_TABLET | Freq: Every day | ORAL | Status: DC
  Administered 2020-11-04 – 2020-11-14 (×11): 200 mg via ORAL
  Filled 2020-11-04 (×11): qty 1

## 2020-11-04 MED ORDER — ONDANSETRON HCL 4 MG PO TABS
4.0000 mg | ORAL_TABLET | Freq: Four times a day (QID) | ORAL | Status: DC | PRN
  Administered 2020-11-09 (×2): 4 mg via ORAL
  Filled 2020-11-04 (×2): qty 1

## 2020-11-04 MED ORDER — LORAZEPAM 1 MG PO TABS: 1.0000 mg | ORAL_TABLET | Freq: Two times a day (BID) | ORAL | Status: DC | PRN

## 2020-11-04 MED ORDER — ACETAMINOPHEN 650 MG RE SUPP: 650.0000 mg | Freq: Four times a day (QID) | RECTAL | Status: DC | PRN

## 2020-11-04 MED ORDER — POLYETHYLENE GLYCOL 3350 17 G PO PACK: 17.0000 g | PACK | Freq: Every day | ORAL | Status: DC | PRN

## 2020-11-04 MED ORDER — ACETAMINOPHEN 325 MG PO TABS
650.0000 mg | ORAL_TABLET | Freq: Four times a day (QID) | ORAL | Status: DC | PRN
  Administered 2020-11-11 – 2020-11-13 (×2): 650 mg via ORAL
  Filled 2020-11-04 (×2): qty 2

## 2020-11-04 MED ORDER — HEPARIN SODIUM (PORCINE) 5000 UNIT/ML IJ SOLN
5000.0000 [IU] | Freq: Three times a day (TID) | INTRAMUSCULAR | Status: DC
  Administered 2020-11-04 – 2020-11-13 (×15): 5000 [IU] via SUBCUTANEOUS
  Filled 2020-11-04 (×21): qty 1

## 2020-11-04 NOTE — ED Notes (Signed)
Pt alert and oriented x4 but is speaking to people who are not in the room and "trying to find the cat that has been running around"

## 2020-11-04 NOTE — Progress Notes (Signed)
Jose Garrett  Jose Garrett [id number] [birth year]  Aware of patient admitted for observation due to progressively worsened disorientation, lethargy and hallucinations.  Patient had excision of L wrist dorsal volar mass on 8/24.  Surgery was scheduled on patient dialysis day so he had dialysis off schedule prior to surgery on 8/23.  Patient is due for dialysis today.  Creatinine elevated at 9.36. K in goal.  CT head negative, CXR with early CHF.  Oxygenating well on room air.   OP dialysis orders: [facility] MWF 4hrs 400/AF 1.5 2K 2Ca Profile 4 EDW 76.5kg RU AVF Hep 2400 Venofer 161mcg x5HD Mircera 62mcg on 8/17 Calcitriol 62mcg qHD  Plan for dialysis today per regular schedule.  Will do shortened treatment, 3hrs due to staffing issues and increased patient census.  If patient requires inpatient status will complete full consult.   Jen Mow, PA-C [facility] Pager: [phone]

## 2020-11-04 NOTE — ED Notes (Signed)
Tele  Breakfast Ordered 

## 2020-11-04 NOTE — ED Notes (Signed)
Pt placed on 2L[place], 02 sat=98% on 2L [place].  Pt alert and oriented, will converse about people/things that are not there. Pt pleasant, calm and cooperative, denies any complaints at this time.

## 2020-11-04 NOTE — H&P (Signed)
History and Physical    Jose Garrett OVZ:[id number] DOB: 27-May-1950 DOA: 11/03/2020  PCP: [name], NP  Patient coming from: Home   Chief Complaint:  Chief Complaint  Patient presents with   Altered Mental Status     HPI:    70 year old male with past medical history of end-stage renal disease (HD Mon, Wed , Fri), systolic and diastolic congestive heart failure (Echo 10/2019 EF 45-50% with G1DD), severe aortic stenosis S/P TAVR (10/2018), hypertension, hyperlipidemia, gastroesophageal reflux disease coronary artery disease (last cath 07/2018 with total RCA occlusion, collateralized on medical mgmt), and right BKA 04/2019 who presented with enlarging masses of the lower aspect of the distal left forearm status post excisional biopsy on 8/24.  Patient now presents to [facility] emergency department via EMS due to progressively worsening disorientation lethargy and hallucinations.  Due to the nature of the patient's presentation, I have attempted to contact the daughter to obtain a history using the phone number listed on the facesheet but have been unsuccessful.  The history has been obtained from discussion with emergency room staff, review of triage notes and review of ED notes.  Patient explains that he has been experiencing substantial pain of the left upper extremity since the surgery on 8/24.  According to the daughter, the patient has taken 2 doses of as needed opiates since the procedure for pain and over this span of time has begun to exhibit behavior as well as "talking to the TV" according to the daughter.  Patient additionally reports that his last session of hemodialysis was on Wednesday and he feels that "they took too much fluid off of me."  Patient denies cough, shortness of breath, fevers or diarrhea.  Patient explains that he is anuric.  Due to patient's odd behavior and apparent hallucinations EMS was contacted by the daughter who promptly brought the patient into  [facility] emergency department for evaluation.  Upon evaluation in the emergency department patient underwent work-up including CT imaging of the head, unremarkable alcohol level, chemistry and CBC.  Throughout the emergency department evaluation patient continued to exhibit confusion and disorientation according to the family and therefore the hospitalist group has been called to assess the patient for admission to the hospital for continued work-up.        Review of Systems:   Review of Systems  Unable to perform ROS: Mental acuity   Past Medical History:  Diagnosis Date   Amyloidosis ([place])    Anemia of chronic disease    Anxiety    CAD (coronary artery disease)    a. cardiac cath on 11/25/17 with occlusion of the RCA which could have been his event but the RCA could not be engaged. The RCA filled distally from left to right collaterals. There were no severe stenoses in the left coronary system. Medical therapy recommended, started on plavix.   Cancer [facility])    multiple myeloma   Chronic combined systolic and diastolic CHF (congestive heart failure) ([place])    a. EF dropped to 35-40% with grade 2 DD by echo 11/2017.   DJD (degenerative joint disease), lumbar    Dysrhythmia    afib   ESRD (end stage renal disease) ([place])    ESRD- HEMO MWF- [place]   GERD (gastroesophageal reflux disease)    Heart murmur    Hyperlipidemia    Hypertension    Myocardial infarction [facility])    Neuropathy    Peripheral vascular disease (HCC)    Pneumonia  PONV (postoperative nausea and vomiting)    Restless legs    S/P TAVR (transcatheter aortic valve replacement) 10/21/2018   26 mm Edwards Sapien 3 Ultra transcatheter heart valve placed via percutaneous right transfemoral approach    Severe aortic stenosis    severe    Past Surgical History:  Procedure Laterality Date   ABDOMINAL AORTOGRAM W/LOWER EXTREMITY Bilateral 07/16/2018   Procedure: ABDOMINAL AORTOGRAM W/LOWER EXTREMITY;   Surgeon: Wellington Hampshire, MD;  Location: [place] CV LAB;  Service: Cardiovascular;  Laterality: Bilateral;   ABDOMINAL AORTOGRAM W/LOWER EXTREMITY N/A 04/09/2019   Procedure: ABDOMINAL AORTOGRAM W/LOWER EXTREMITY - Right Lower;  Surgeon: Waynetta Sandy, MD;  Location: [place] CV LAB;  Service: Cardiovascular;  Laterality: N/A;   AMPUTATION Left 08/19/2018   Procedure: AMPUTATION LEFT TOES SECOND AND THIRD;  Surgeon: Waynetta Sandy, MD;  Location: [place];  Service: Vascular;  Laterality: Left;   AMPUTATION Right 05/07/2019   Procedure: AMPUTATION BELOW KNEE;  Surgeon: Serafina Mitchell, MD;  Location: [place];  Service: Vascular;  Laterality: Right;   AV FISTULA PLACEMENT Right 12/27/2015   Procedure: Right Arm ARTERIOVENOUS (AV) FISTULA CREATION;  Surgeon: Angelia Mould, MD;  Location: [place];  Service: Vascular;  Laterality: Right;   CARDIAC CATHETERIZATION     CARDIAC VALVE REPLACEMENT     CATARACT EXTRACTION W/ INTRAOCULAR LENS  IMPLANT, BILATERAL     COLONOSCOPY     ERCP W/ SPHICTEROTOMY     EYE SURGERY     FEMORAL-POPLITEAL BYPASS GRAFT Left 07/22/2018   Procedure: left FEMORAL TO BELOW KNEE POPLITEAL ARTERY BYPASS GREAFT using 87m removable ring propaten gore graft;  Surgeon: CWaynetta Sandy MD;  [place]  Service: Vascular;  Laterality: Left;   FEMORAL-POPLITEAL BYPASS GRAFT Right 04/10/2019   Procedure: BYPASS GRAFT RIGHT FEMORAL-POPLITEAL ARTERY;  Surgeon: ERosetta Posner MD;  [place]  Service: Vascular;  Laterality: Right;   GANGLION CYST EXCISION Left 11/02/2020   Procedure: EXCISION OF LEFT WRIST DORAL VOLAR MASSES;  Surgeon: WCharlotte Crumb MD;  [place]  Service: Orthopedics;  Laterality: Left;  CPT 25073   I & D EXTREMITY Right 05/05/2019   Procedure: Revision of  RIGHT  FOOT Amputation;  Surgeon: CWaynetta Sandy MD;  [place]  Service: Vascular;  Laterality: Right;   INTRAOPERATIVE  TRANSTHORACIC ECHOCARDIOGRAM N/A 10/21/2018   Procedure: Intraoperative Transthoracic Echocardiogram;  Surgeon: CSherren Mocha MD;  [place]  Service: Open Heart Surgery;  Laterality: N/A;   IR GENERIC HISTORICAL  01/27/2016   IR FLUORO GUIDE CV LINE RIGHT 01/27/2016 DArne Cleveland MD [facility]INTER[facility]   IR GENERIC HISTORICAL  01/27/2016   IR [place] ACCESS RIGHT 01/27/2016 DArne Cleveland MD [facility]   LAPAROSCOPIC CHOLECYSTECTOMY  03/2011   LEFT HEART CATH AND CORONARY ANGIOGRAPHY N/A 11/25/2017   Procedure: LEFT HEART CATH AND CORONARY ANGIOGRAPHY;  Surgeon: JMartinique Peter M, MD;  [place];  Service: Cardiovascular;  Laterality: N/A;   LEFT HEART CATH AND CORONARY ANGIOGRAPHY N/A 07/24/2018   Procedure: LEFT HEART CATH AND CORONARY ANGIOGRAPHY;  Surgeon: CSherren Mocha MD;  [place];  Service: Cardiovascular;  Laterality: N/A;   RENAL BIOPSY, PERCUTANEOUS Right 09/08/2014   RIGHT/LEFT HEART CATH AND CORONARY ANGIOGRAPHY N/A 03/25/2018   Procedure: RIGHT/LEFT HEART CATH AND CORONARY ANGIOGRAPHY;  Surgeon: JMartinique Peter M, MD;  [place];  Service: Cardiovascular;  Laterality: N/A;   TEE  WITHOUT CARDIOVERSION N/A 05/14/2019   Procedure: TRANSESOPHAGEAL ECHOCARDIOGRAM (TEE);  Surgeon: Fay Records, MD;  Location: [facility] ENDOSCOPY;  Service: Cardiovascular;  Laterality: N/A;   TOE AMPUTATION Left 08/19/2018   2nd & 3rd toes   TONSILLECTOMY  ~ 1960   TRANSCATHETER AORTIC VALVE REPLACEMENT, TRANSFEMORAL N/A 10/21/2018   Procedure: TRANSCATHETER AORTIC VALVE REPLACEMENT, TRANSFEMORAL;  Surgeon: Sherren Mocha, MD;  Location: [place];  Service: Open Heart Surgery;  Laterality: N/A;   TRANSMETATARSAL AMPUTATION Right 04/29/2019   Procedure: RIGHT TRANSMETATARSAL AMPUTATION;  Surgeon: Waynetta Sandy, MD;  Location: [place];  Service: Vascular;  Laterality: Right;   ULTRASOUND GUIDANCE FOR VASCULAR ACCESS  03/25/2018    Procedure: Ultrasound Guidance For Vascular Access;  Surgeon: Martinique, Peter M, MD;  Location: [place] CV LAB;  Service: Cardiovascular;;     reports that he quit smoking about 9 years ago. His smoking use included cigarettes. He has a 80.00 pack-year smoking history. He has never used smokeless tobacco. He reports that he does not drink alcohol and does not use drugs.  No Known Allergies  Family History  Problem Relation Age of Onset   Hypertension Mother    Diabetes Mother    Heart disease Mother    Skin cancer Mother    Hypertension Father    Diabetes Father    Diabetes Sister      Prior to Admission medications   Medication Sig Start Date End Date Taking? Authorizing Provider  acetaminophen (TYLENOL) 325 MG tablet Take 1-2 tablets (325-650 mg total) by mouth every 4 (four) hours as needed for mild pain (or temp >/= 101 F). 05/27/19   Angiulli, Lavon Paganini, PA-C  amiodarone (PACERONE) 200 MG tablet TAKE 1 TABLET (200 MG TOTAL) BY MOUTH DAILY. 09/07/20 09/07/21  Burnell Blanks, MD  amLODipine (NORVASC) 10 MG tablet Take 1 tablet (10 mg total) by mouth at bedtime. 10/18/20   Oswald Hillock, MD  atorvastatin (LIPITOR) 80 MG tablet TAKE 1 TABLET BY MOUTH DAILY WITH SUPPER 04/28/20 04/28/21  Burnell Blanks, MD  B Complex-C-Folic Acid (RENA-VITE RX) 1 MG TABS Take 1 tablet by mouth once a day 10/19/20     clopidogrel (PLAVIX) 75 MG tablet TAKE 1 TABLET BY MOUTH DAILY WITH SUPPER 04/28/20 04/28/21  Burnell Blanks, MD  clopidogrel (PLAVIX) 75 MG tablet Take 1 tablet by mouth once a day Patient not taking: Reported on 10/24/2020 10/19/20     Darbepoetin Alfa (ARANESP) 100 MCG/0.5ML SOSY injection Inject 0.5 mLs (100 mcg total) into the vein every Friday with hemodialysis. 05/29/19   Angiulli, Lavon Paganini, PA-C  docusate sodium (COLACE) 100 MG capsule Take 1 capsule (100 mg total) by mouth 2 (two) times daily. Patient taking differently: Take 100 mg by mouth daily as needed for  moderate constipation. 05/27/19   Angiulli, Lavon Paganini, PA-C  iron sucrose in sodium chloride 0.9 % 100 mL Iron Sucrose (Venofer) 07/27/20 07/26/21  [provider]  lanthanum (FOSRENOL) 1000 MG chewable tablet Chew 2,000 mg by mouth 3 (three) times daily. 07/15/20   [provider]  LORazepam (ATIVAN) 1 MG tablet Take 1 tablet (1 mg total) by mouth 2 (two) times daily as needed for anxiety. 05/27/19   Angiulli, Lavon Paganini, PA-C  Methoxy PEG-Epoetin Beta (MIRCERA IJ) Mircera 06/08/20 06/07/21  [provider]  multivitamin (RENA-VIT) TABS tablet Take 1 tablet by mouth at bedtime. Patient not taking: Reported on 10/24/2020 08/12/18   Cathlyn Parsons, PA-C  ondansetron [facility]) 4  MG tablet TAKE 1 TABLET BY MOUTH TWICE A DAY AS NEEDED 06/28/20 06/28/21  Wellington Hampshire, MD  oxyCODONE (OXYCONTIN) 10 mg 12 hr tablet Take 10 mg by mouth 2 (two) times daily as needed (pain).    [provider]  Oxycodone HCl 10 MG TABS TAKE 1 TABLET BY MOUTH 4 TIMES A DAY AS NEEDED. 10/13/20     oxyCODONE-acetaminophen (PERCOCET) 5-325 MG tablet Take 1 tablet by mouth every 4 (four) hours as needed for severe pain. 11/02/20 11/02/21  Charlotte Crumb, MD  pantoprazole (PROTONIX) 40 MG tablet Take 1 tablet (40 mg total) by mouth daily. 05/28/19   Angiulli, Lavon Paganini, PA-C  vitamin B-12 (CYANOCOBALAMIN) 1000 MCG tablet Take 1 tablet (1,000 mcg total) by mouth every evening. 05/27/19   Cathlyn Parsons, PA-C    Physical Exam: Vitals:   11/04/20 0230 11/04/20 0330 11/04/20 0655 11/04/20 0700  BP:  (!) 174/70 95/85 121/80  Pulse: 83 84 82 79  Resp: (!) _0 Temp:      TempSrc:      SpO2: 96% 100% 96% 97%     Constitutional: Lethargic arousable and oriented x3, no associated distress.   Skin: no rashes, no lesions, extremely poor skin turgor noted.  Eyes: Pupils are equally reactive to light.  No evidence of scleral icterus or conjunctival pallor.  ENMT: Dry mucous membranes noted.   Posterior pharynx clear of any exudate or lesions.   Neck: normal, supple, no masses, no thyromegaly.  No evidence of jugular venous distension.   Respiratory: clear to auscultation bilaterally, no wheezing, no crackles. Normal respiratory effort. No accessory muscle use.  Cardiovascular: Regular rate and rhythm, no murmurs / rubs / gallops. No extremity edema. 2+ pedal pulses. No carotid bruits.  Chest:   Nontender without crepitus or deformity.   Back:   Nontender without crepitus or deformity. Abdomen: Abdomen is soft and nontender.  No evidence of intra-abdominal masses.  Positive bowel sounds noted in all quadrants.   Musculoskeletal: Right BKA noted.   Good ROM, no contractures. Normal muscle tone.  Neurologic: CN 2-12 grossly intact. Sensation intact.  Patient moving all 4 extremities spontaneously.  Patient is following all commands.  Patient is responsive to verbal stimuli.   Psychiatric: Patient exhibits normal mood with odd affect.  Patient seems to possess insight as to their current situation.  Patient exhibiting no evidence of responding to internal stimuli during my exam and interview.   Labs on Admission: I have personally reviewed following labs and imaging studies -   CBC: Recent Labs  Lab 11/02/20 1244 11/03/20 2144 11/04/20 0130 11/04/20 0143  WBC  --  7.9 9.4  --   NEUTROABS  --  6.3 7.5  --   HGB 8.5* 9.3* 9.3* 9.2*  HCT 25.0* 28.8* 29.0* 27.0*  MCV  --  96.6 97.3  --   PLT  --  133* 129*  --    Basic Metabolic Panel: Recent Labs  Lab 11/02/20 1244 11/03/20 2144 11/04/20 0130 11/04/20 0143  NA 137 137 138 137  K 3.2* 3.7 3.5 3.5  CL 94* 93* 94*  --   CO2  --  27 25  --   GLUCOSE 144* 106* 102*  --   BUN 23 40* 42*  --   CREATININE 6.50* 9.05* 9.36*  --   CALCIUM  --  9.6 9.5  --   MG  --   --  2.1  --   PHOS  --   --  8.4*  --    GFR: Estimated Creatinine Clearance: 7.8 mL/min (A) (by C-G formula based on SCr of 9.36 mg/dL (H)). Liver Function  Tests: Recent Labs  Lab 11/03/20 2144 11/04/20 0130  AST 19 18  ALT 7 6  ALKPHOS 51 50  BILITOT 0.7 0.7  PROT 6.6 6.8  ALBUMIN 3.7 3.5   No results for input(s): LIPASE, AMYLASE in the last 168 hours. Recent Labs  Lab 11/04/20 0130  AMMONIA 14   Coagulation Profile: No results for input(s): INR, PROTIME in the last 168 hours. Cardiac Enzymes: No results for input(s): CKTOTAL, CKMB, CKMBINDEX, TROPONINI in the last 168 hours. BNP (last 3 results) No results for input(s): PROBNP in the last 8760 hours. HbA1C: No results for input(s): HGBA1C in the last 72 hours. CBG: No results for input(s): GLUCAP in the last 168 hours. Lipid Profile: No results for input(s): CHOL, HDL, LDLCALC, TRIG, CHOLHDL, LDLDIRECT in the last 72 hours. Thyroid Function Tests: Recent Labs    11/04/20 0130  TSH 0.829   Anemia Panel: Recent Labs    11/04/20 0130  VITAMINB12 264  FOLATE 15.9   Urine analysis:    Component Value Date/Time   COLORURINE AMBER (A) 02/01/2016 1707   APPEARANCEUR CLOUDY (A) 02/01/2016 1707   LABSPEC 1.020 02/01/2016 1707   PHURINE 5.5 02/01/2016 1707   GLUCOSEU 100 (A) 02/01/2016 1707   HGBUR NEGATIVE 02/01/2016 1707   BILIRUBINUR SMALL (A) 02/01/2016 1707   KETONESUR NEGATIVE 02/01/2016 1707   PROTEINUR >300 (A) 02/01/2016 1707   NITRITE NEGATIVE 02/01/2016 1707   LEUKOCYTESUR TRACE (A) 02/01/2016 1707    Radiological Exams on Admission - Personally Reviewed: DG Chest 1 View  Result Date: 11/04/2020 CLINICAL DATA:  Altered mental status EXAM: CHEST  1 VIEW COMPARISON:  10/17/2020 FINDINGS: Cardiac shadow is enlarged but stable. Changes of prior TAVR are seen. Increased central vascular congestion is noted with changes of mild edema. No focal confluent infiltrate is seen. IMPRESSION: Early CHF.  No other focal abnormality is noted. Electronically Signed   By: Inez Catalina M.D.   On: 11/04/2020 01:47   CT Head Wo Contrast  Result Date: 11/03/2020 CLINICAL  DATA:  Mental status changes EXAM: CT HEAD WITHOUT CONTRAST TECHNIQUE: Contiguous axial images were obtained from the base of the skull through the vertex without intravenous contrast. COMPARISON:  None. FINDINGS: Brain: No acute intracranial abnormality. Specifically, no hemorrhage, hydrocephalus, mass lesion, acute infarction, or significant intracranial injury. Vascular: No hyperdense vessel or unexpected calcification. Skull: No acute calvarial abnormality. Sinuses/Orbits: No acute findings Other: None IMPRESSION: No acute intracranial abnormality. Electronically Signed   By: Rolm Baptise M.D.   On: 11/03/2020 22:25     Assessment/Plan Principal Problem:   Acute metabolic encephalopathy  Patient presenting with nearly 2-day history of odd behavior and responding to internal stimuli according to family On my exam patient is lethargic but is currently oriented and not exhibiting any signs of hallucinations. Patient is possibly suffering from transient confusion due to repeated doses of opiates in the past 2 days however per review of patient's chart patient has been on opiates for a long time. Working patient up with CT imaging of the head, TSH, vitamin B12, chest x-ray ammonia and VBG Patient also reports receiving dialysis on Monday Tuesday and Wednesday of this week.  If work-up is negative a relatively unlikely but still possible cause could also be disequilibrium syndrome Will gently hydrate with approximately 500 cc of fluid to avoid any evidence  of intermittent volume overload. Continue conservative management with close clinical monitoring.  Active Problems:   Essential hypertension, benign  Continue home regimen of antihypertensive therapy    ESRD (end stage renal disease) on dialysis [facility])  Discussed case with Dr. Jonnie Finner with nephrology who will see the patient in consultation for resumption of dialysis while hospitalized.    Coronary artery disease involving native coronary  artery of native heart without angina pectoris  Patient is currently chest pain-free Continue home regimen of Plavix and statin therapy Monitoring patient on telemetry     GERD without esophagitis  Continue home regimen of daily Protonix    Chronic combined systolic and diastolic CHF (congestive heart failure) ([place])  Patient appears to be volume depleted without any evidence of cardiogenic volume overload    Mixed hyperlipidemia  Continue home regimen of statin therapy   Code Status:  Full code Family Communication: Attempted to contact daughter via listed phone number on facesheet but have been unsuccessful.  Status is: Observation  The patient remains OBS appropriate and will d/c before 2 midnights.  Dispo: The patient is from: Home              Anticipated d/c is to: Home              Patient currently is not medically stable to d/c.   Difficult to place patient No        Vernelle Emerald MD [facility] Pager [phone]  If 7PM-7AM, please contact night-coverage [email or web address] Use universal [place] password for that web site. If you do not have the password, please call the hospital operator.  11/04/2020, 9:04 AM

## 2020-11-04 NOTE — Progress Notes (Signed)
I examined the patient in the emergency room.  Also discussed with nephrology for dialysis needs.  Admitted early morning hours by nighttime hospitalist with reported hallucination.  In brief, ESRD hemodialysis Monday Wednesday Friday, chronic combined heart failure, severe aortic stenosis status post TAVR, hypertension, hyperlipidemia, GERD, right BKA, excisional biopsy of the left forearm lesion 8/24 brought to the ER with worsening disorientation, lethargy and hallucinations.  Lives at home with daughter.  Work-up so far negative.  Continue to exhibit confusion and disorientation so admitted.  Patient received general anesthesia on 8/24 for cyst excision.  Probably encephalopathy due to medications, opiates and benzodiazepines.  Hemodynamically stable.  Neurologically stable.  Hopefully it will improve with hemodialysis.  If symptoms improved, will be able to go home after hemodialysis. Could not connect to patient's daughter.

## 2020-11-05 DIAGNOSIS — I4891 Unspecified atrial fibrillation: Secondary | ICD-10-CM | POA: Diagnosis present

## 2020-11-05 DIAGNOSIS — Z79891 Long term (current) use of opiate analgesic: Secondary | ICD-10-CM | POA: Diagnosis not present

## 2020-11-05 DIAGNOSIS — C9000 Multiple myeloma not having achieved remission: Secondary | ICD-10-CM | POA: Diagnosis present

## 2020-11-05 DIAGNOSIS — Z992 Dependence on renal dialysis: Secondary | ICD-10-CM | POA: Diagnosis not present

## 2020-11-05 DIAGNOSIS — G629 Polyneuropathy, unspecified: Secondary | ICD-10-CM | POA: Diagnosis present

## 2020-11-05 DIAGNOSIS — N2581 Secondary hyperparathyroidism of renal origin: Secondary | ICD-10-CM | POA: Diagnosis present

## 2020-11-05 DIAGNOSIS — E859 Amyloidosis, unspecified: Secondary | ICD-10-CM | POA: Diagnosis present

## 2020-11-05 DIAGNOSIS — I2510 Atherosclerotic heart disease of native coronary artery without angina pectoris: Secondary | ICD-10-CM | POA: Diagnosis present

## 2020-11-05 DIAGNOSIS — G9341 Metabolic encephalopathy: Secondary | ICD-10-CM | POA: Diagnosis not present

## 2020-11-05 DIAGNOSIS — R4182 Altered mental status, unspecified: Secondary | ICD-10-CM | POA: Diagnosis present

## 2020-11-05 DIAGNOSIS — Z7902 Long term (current) use of antithrombotics/antiplatelets: Secondary | ICD-10-CM | POA: Diagnosis not present

## 2020-11-05 DIAGNOSIS — E782 Mixed hyperlipidemia: Secondary | ICD-10-CM | POA: Diagnosis present

## 2020-11-05 DIAGNOSIS — N186 End stage renal disease: Secondary | ICD-10-CM | POA: Diagnosis present

## 2020-11-05 DIAGNOSIS — I5042 Chronic combined systolic (congestive) and diastolic (congestive) heart failure: Secondary | ICD-10-CM | POA: Diagnosis present

## 2020-11-05 DIAGNOSIS — F419 Anxiety disorder, unspecified: Secondary | ICD-10-CM | POA: Diagnosis present

## 2020-11-05 DIAGNOSIS — T402X5A Adverse effect of other opioids, initial encounter: Secondary | ICD-10-CM | POA: Diagnosis present

## 2020-11-05 DIAGNOSIS — I132 Hypertensive heart and chronic kidney disease with heart failure and with stage 5 chronic kidney disease, or end stage renal disease: Secondary | ICD-10-CM | POA: Diagnosis present

## 2020-11-05 DIAGNOSIS — Z79899 Other long term (current) drug therapy: Secondary | ICD-10-CM | POA: Diagnosis not present

## 2020-11-05 DIAGNOSIS — Z20822 Contact with and (suspected) exposure to covid-19: Secondary | ICD-10-CM | POA: Diagnosis present

## 2020-11-05 DIAGNOSIS — K219 Gastro-esophageal reflux disease without esophagitis: Secondary | ICD-10-CM | POA: Diagnosis present

## 2020-11-05 DIAGNOSIS — D631 Anemia in chronic kidney disease: Secondary | ICD-10-CM | POA: Diagnosis present

## 2020-11-05 DIAGNOSIS — T424X5A Adverse effect of benzodiazepines, initial encounter: Secondary | ICD-10-CM | POA: Diagnosis present

## 2020-11-05 DIAGNOSIS — Z953 Presence of xenogenic heart valve: Secondary | ICD-10-CM | POA: Diagnosis not present

## 2020-11-05 DIAGNOSIS — Z89511 Acquired absence of right leg below knee: Secondary | ICD-10-CM | POA: Diagnosis not present

## 2020-11-05 DIAGNOSIS — G928 Other toxic encephalopathy: Secondary | ICD-10-CM | POA: Diagnosis present

## 2020-11-05 DIAGNOSIS — R410 Disorientation, unspecified: Secondary | ICD-10-CM | POA: Diagnosis present

## 2020-11-05 DIAGNOSIS — I739 Peripheral vascular disease, unspecified: Secondary | ICD-10-CM | POA: Diagnosis present

## 2020-11-05 NOTE — Consult Note (Signed)
[facility] Renal Consultation Note    Indication for Consultation:  Management of ESRD/hemodialysis; anemia, hypertension/volume and secondary hyperparathyroidism  MBT:DHRCBU, Maudie Mercury, NP  HPI: Jose Garrett is a 70 y.o. male with ESRD on HD MWF at [facility].  Past medical history significant for HTN, GERD, hyperlipidemia, RLS, thrombocytopenia, severe AS s/p TAVR on 3/84/53, combined systolic/diastolic HD, CAD, Hx multiple myeloma, A fib, and PVD s/p R BKA.  Of note patient completed dialysis on Monday per regular schedule and again on Tuesday to prepare for missing HD on Wednesday due to scheduled surgery.    Patient presented to the ED on 8/25 due to hallucinations.  Part of history obtained from chart review.  The day prior he underwent scheduled excisional biopsy of L wrist dorsal volar mass and had been taking pain medicine as prescribed per daughter.  Per notes patient has been speaking to people who are not in the room and seeing other objects that are not present.  He completed 3 hr treatment yesterday with net 3L removed.    Seen and examined at bedside today.  Alert and oriented to person, place, date.  Continues to hallucinate trying to had me an object which is not in his hand and looks confused when I tell him I do not see it. Denies CP, SOB, n/v/d, abdominal pain, weakness, dizziness and fatigue.  Admits to confusion when he first wakes up but states he feels clear headed after a few minutes.  Pertinent findings in work up include CT head with no acute abnormalities, CXR with early CHF and negative for ETOH.  Patient admitted for further evaluation and management.    Past Medical History:  Diagnosis Date   Amyloidosis ([place])    Anemia of chronic disease    Anxiety    CAD (coronary artery disease)    a. cardiac cath on 11/25/17 with occlusion of the RCA which could have been his event but the RCA could not be engaged. The RCA filled distally from left to  right collaterals. There were no severe stenoses in the left coronary system. Medical therapy recommended, started on plavix.   Cancer [facility])    multiple myeloma   Chronic combined systolic and diastolic CHF (congestive heart failure) ([place])    a. EF dropped to 35-40% with grade 2 DD by echo 11/2017.   DJD (degenerative joint disease), lumbar    Dysrhythmia    afib   ESRD (end stage renal disease) (HCC)    ESRD- HEMO MWF- [address]   GERD (gastroesophageal reflux disease)    Heart murmur    Hyperlipidemia    Hypertension    Myocardial infarction (HCC)    Neuropathy    Peripheral vascular disease (HCC)    Pneumonia    PONV (postoperative nausea and vomiting)    Restless legs    S/P TAVR (transcatheter aortic valve replacement) 10/21/2018   26 mm Edwards Sapien 3 Ultra transcatheter heart valve placed via percutaneous right transfemoral approach    Severe aortic stenosis    severe   Past Surgical History:  Procedure Laterality Date   ABDOMINAL AORTOGRAM W/LOWER EXTREMITY Bilateral 07/16/2018   Procedure: ABDOMINAL AORTOGRAM W/LOWER EXTREMITY;  Surgeon: Wellington Hampshire, MD;  Location: [place] CV LAB;  Service: Cardiovascular;  Laterality: Bilateral;   ABDOMINAL AORTOGRAM W/LOWER EXTREMITY N/A 04/09/2019   Procedure: ABDOMINAL AORTOGRAM W/LOWER EXTREMITY - Right Lower;  Surgeon: Waynetta Sandy, MD;  Location: [place] CV LAB;  Service: Cardiovascular;  Laterality: N/A;   AMPUTATION Left 08/19/2018   Procedure: AMPUTATION LEFT TOES SECOND AND THIRD;  Surgeon: Waynetta Sandy, MD;  Location: [place];  Service: Vascular;  Laterality: Left;   AMPUTATION Right 05/07/2019   Procedure: AMPUTATION BELOW KNEE;  Surgeon: Serafina Mitchell, MD;  Location: [place];  Service: Vascular;  Laterality: Right;   AV FISTULA PLACEMENT Right 12/27/2015   Procedure: Right Arm ARTERIOVENOUS (AV) FISTULA CREATION;  Surgeon: Angelia Mould, MD;  Location: [place];  Service:  Vascular;  Laterality: Right;   CARDIAC CATHETERIZATION     CARDIAC VALVE REPLACEMENT     CATARACT EXTRACTION W/ INTRAOCULAR LENS  IMPLANT, BILATERAL     COLONOSCOPY     ERCP W/ SPHICTEROTOMY     EYE SURGERY     FEMORAL-POPLITEAL BYPASS GRAFT Left 07/22/2018   Procedure: left FEMORAL TO BELOW KNEE POPLITEAL ARTERY BYPASS GREAFT using 67mm removable ring propaten gore graft;  Surgeon: Waynetta Sandy, MD;  Location: [place];  Service: Vascular;  Laterality: Left;   FEMORAL-POPLITEAL BYPASS GRAFT Right 04/10/2019   Procedure: BYPASS GRAFT RIGHT FEMORAL-POPLITEAL ARTERY;  Surgeon: Rosetta Posner, MD;  Location: [place];  Service: Vascular;  Laterality: Right;   GANGLION CYST EXCISION Left 11/02/2020   Procedure: EXCISION OF LEFT WRIST DORAL VOLAR MASSES;  Surgeon: Charlotte Crumb, MD;  Location: [place];  Service: Orthopedics;  Laterality: Left;  CPT 25073   I & D EXTREMITY Right 05/05/2019   Procedure: Revision of  RIGHT  FOOT Amputation;  Surgeon: Waynetta Sandy, MD;  Location: [place];  Service: Vascular;  Laterality: Right;   INTRAOPERATIVE TRANSTHORACIC ECHOCARDIOGRAM N/A 10/21/2018   Procedure: Intraoperative Transthoracic Echocardiogram;  Surgeon: Sherren Mocha, MD;  Location: [place];  Service: Open Heart Surgery;  Laterality: N/A;   IR GENERIC HISTORICAL  01/27/2016   IR FLUORO GUIDE CV LINE RIGHT 01/27/2016 Arne Cleveland, MD [facility]   IR GENERIC HISTORICAL  01/27/2016   IR US GUIDE VASC ACCESS RIGHT 01/27/2016 Arne Cleveland, MD [facility]   LAPAROSCOPIC CHOLECYSTECTOMY  03/2011   LEFT HEART CATH AND CORONARY ANGIOGRAPHY N/A 11/25/2017   Procedure: LEFT HEART CATH AND CORONARY ANGIOGRAPHY;  Surgeon: Martinique, Peter M, MD;  Location: [place] CV LAB;  Service: Cardiovascular;  Laterality: N/A;   LEFT HEART CATH AND CORONARY ANGIOGRAPHY N/A 07/24/2018   Procedure: LEFT HEART CATH AND CORONARY ANGIOGRAPHY;  Surgeon: Sherren Mocha, MD;  Location: [place] CV  LAB;  Service: Cardiovascular;  Laterality: N/A;   RENAL BIOPSY, PERCUTANEOUS Right 09/08/2014   RIGHT/LEFT HEART CATH AND CORONARY ANGIOGRAPHY N/A 03/25/2018   Procedure: RIGHT/LEFT HEART CATH AND CORONARY ANGIOGRAPHY;  Surgeon: Martinique, Peter M, MD;  Location: [place] CV LAB;  Service: Cardiovascular;  Laterality: N/A;   TEE WITHOUT CARDIOVERSION N/A 05/14/2019   Procedure: TRANSESOPHAGEAL ECHOCARDIOGRAM (TEE);  Surgeon: Fay Records, MD;  Location: [facility];  Service: Cardiovascular;  Laterality: N/A;   TOE AMPUTATION Left 08/19/2018   2nd & 3rd toes   TONSILLECTOMY  ~ 1960   TRANSCATHETER AORTIC VALVE REPLACEMENT, TRANSFEMORAL N/A 10/21/2018   Procedure: TRANSCATHETER AORTIC VALVE REPLACEMENT, TRANSFEMORAL;  Surgeon: Sherren Mocha, MD;  Location: [place];  Service: Open Heart Surgery;  Laterality: N/A;   TRANSMETATARSAL AMPUTATION Right 04/29/2019   Procedure: RIGHT TRANSMETATARSAL AMPUTATION;  Surgeon: Waynetta Sandy, MD;  Location: [place];  Service: Vascular;  Laterality: Right;   ULTRASOUND GUIDANCE FOR VASCULAR ACCESS  03/25/2018   Procedure: Ultrasound Guidance For Vascular Access;  Surgeon: Martinique, Peter M, MD;  Location: [place] CV LAB;  Service: Cardiovascular;;   Family History  Problem Relation Age of Onset   Hypertension Mother    Diabetes Mother    Heart disease Mother    Skin cancer Mother    Hypertension Father    Diabetes Father    Diabetes Sister    Social History:  reports that he quit smoking about 9 years ago. His smoking use included cigarettes. He has a 80.00 pack-year smoking history. He has never used smokeless tobacco. He reports that he does not drink alcohol and does not use drugs. No Known Allergies Prior to Admission medications   Medication Sig Start Date End Date Taking? Authorizing Provider  acetaminophen (TYLENOL) 325 MG tablet Take 1-2 tablets (325-650 mg total) by mouth every 4 (four) hours as needed for mild pain (or temp >/=  101 F). 05/27/19  Yes Angiulli, Lavon Paganini, PA-C  amiodarone (PACERONE) 200 MG tablet TAKE 1 TABLET (200 MG TOTAL) BY MOUTH DAILY. 09/07/20 09/07/21 Yes Burnell Blanks, MD  amLODipine (NORVASC) 10 MG tablet Take 1 tablet (10 mg total) by mouth at bedtime. 10/18/20  Yes Oswald Hillock, MD  atorvastatin (LIPITOR) 80 MG tablet TAKE 1 TABLET BY MOUTH DAILY WITH SUPPER 04/28/20 04/28/21 Yes Burnell Blanks, MD  B Complex-C-Folic Acid (RENA-VITE RX) 1 MG TABS Take 1 tablet by mouth once a day 10/19/20  Yes   clopidogrel (PLAVIX) 75 MG tablet TAKE 1 TABLET BY MOUTH DAILY WITH SUPPER 04/28/20 04/28/21 Yes Burnell Blanks, MD  Darbepoetin Alfa (ARANESP) 100 MCG/0.5ML SOSY injection Inject 0.5 mLs (100 mcg total) into the vein every Friday with hemodialysis. 05/29/19  Yes Angiulli, Lavon Paganini, PA-C  docusate sodium (COLACE) 100 MG capsule Take 1 capsule (100 mg total) by mouth 2 (two) times daily. Patient taking differently: Take 100 mg by mouth daily as needed for moderate constipation. 05/27/19  Yes Angiulli, Lavon Paganini, PA-C  iron sucrose in sodium chloride 0.9 % 100 mL Iron Sucrose (Venofer) 07/27/20 07/26/21 Yes [provider]  lanthanum (FOSRENOL) 1000 MG chewable tablet Chew 2,000 mg by mouth 3 (three) times daily. 07/15/20  Yes [provider]  LORazepam (ATIVAN) 1 MG tablet Take 1 tablet (1 mg total) by mouth 2 (two) times daily as needed for anxiety. 05/27/19  Yes Angiulli, Lavon Paganini, PA-C  Methoxy PEG-Epoetin Beta (MIRCERA IJ) Mircera 06/08/20 06/07/21 Yes [provider]  multivitamin (RENA-VIT) TABS tablet Take 1 tablet by mouth at bedtime. 08/12/18  Yes Angiulli, Lavon Paganini, PA-C  ondansetron (ZOFRAN) 4 MG tablet TAKE 1 TABLET BY MOUTH TWICE A DAY AS NEEDED 06/28/20 06/28/21 Yes Wellington Hampshire, MD  Oxycodone HCl 10 MG TABS TAKE 1 TABLET BY MOUTH 4 TIMES A DAY AS NEEDED. 10/13/20  Yes   oxyCODONE-acetaminophen (PERCOCET) 5-325 MG tablet Take 1 tablet by mouth every 4 (four)  hours as needed for severe pain. 11/02/20 11/02/21 Yes Charlotte Crumb, MD  pantoprazole (PROTONIX) 40 MG tablet Take 1 tablet (40 mg total) by mouth daily. 05/28/19  Yes Angiulli, Lavon Paganini, PA-C  vitamin B-12 (CYANOCOBALAMIN) 1000 MCG tablet Take 1 tablet (1,000 mcg total) by mouth every evening. 05/27/19  Yes Angiulli, Lavon Paganini, PA-C  clopidogrel (PLAVIX) 75 MG tablet Take 1 tablet by mouth once a day Patient not taking: No sig reported 10/19/20      Current Facility-Administered Medications  Medication Dose Route Frequency Provider Last Rate Last Admin   acetaminophen (TYLENOL) tablet 650  mg  650 mg Oral Q6H PRN Marinda Elk, MD       Or   acetaminophen (TYLENOL) suppository 650 mg  650 mg Rectal Q6H PRN Shalhoub, Deno Lunger, MD       amiodarone (PACERONE) tablet 200 mg  200 mg Oral Daily Shalhoub, Deno Lunger, MD   200 mg at 11/05/20 1131   amLODipine (NORVASC) tablet 10 mg  10 mg Oral Daily Marinda Elk, MD   10 mg at 11/05/20 1130   atorvastatin (LIPITOR) tablet 80 mg  80 mg Oral Q supper Marinda Elk, MD   80 mg at 11/04/20 1929   Chlorhexidine Gluconate Cloth 2 % PADS 6 each  6 each Topical Q0600 Kelliann Pendergraph, Lillia Abed, [place]   6 each at 11/05/20 9189   clopidogrel (PLAVIX) tablet 75 mg  75 mg Oral Daily Marinda Elk, MD   75 mg at 11/05/20 1131   heparin injection 5,000 Units  5,000 Units Subcutaneous Q8H Shalhoub, Deno Lunger, MD   5,000 Units at 11/05/20 1430   hydrALAZINE (APRESOLINE) injection 10 mg  10 mg Intravenous Q6H PRN Marinda Elk, MD   10 mg at 11/05/20 1209   lanthanum (FOSRENOL) chewable tablet 2,000 mg  2,000 mg Oral TID WC Shalhoub, Deno Lunger, MD   2,000 mg at 11/05/20 1130   LORazepam (ATIVAN) tablet 1 mg  1 mg Oral BID PRN Marinda Elk, MD       ondansetron [facility]) tablet 4 mg  4 mg Oral Q6H PRN Marinda Elk, MD       Or   ondansetron [facility]) injection 4 mg  4 mg Intravenous Q6H PRN Marinda Elk, MD   4 mg at 11/05/20 0926    pantoprazole (PROTONIX) EC tablet 40 mg  40 mg Oral Daily Marinda Elk, MD   40 mg at 11/05/20 1130   polyethylene glycol (MIRALAX / GLYCOLAX) packet 17 g  17 g Oral Daily PRN Marinda Elk, MD       Labs: Basic Metabolic Panel: Recent Labs  Lab 11/02/20 1244 11/03/20 2144 11/04/20 0130 11/04/20 0143  NA 137 137 138 137  K 3.2* 3.7 3.5 3.5  CL 94* 93* 94*  --   CO2  --  27 25  --   GLUCOSE 144* 106* 102*  --   BUN 23 40* 42*  --   CREATININE 6.50* 9.05* 9.36*  --   CALCIUM  --  9.6 9.5  --   PHOS  --   --  8.4*  --    Liver Function Tests: Recent Labs  Lab 11/03/20 2144 11/04/20 0130  AST 19 18  ALT 7 6  ALKPHOS 51 50  BILITOT 0.7 0.7  PROT 6.6 6.8  ALBUMIN 3.7 3.5    Recent Labs  Lab 11/04/20 0130  AMMONIA 14   CBC: Recent Labs  Lab 11/03/20 2144 11/04/20 0130 11/04/20 0143  WBC 7.9 9.4  --   NEUTROABS 6.3 7.5  --   HGB 9.3* 9.3* 9.2*  HCT 28.8* 29.0* 27.0*  MCV 96.6 97.3  --   PLT 133* 129*  --     Studies/Results: DG Chest 1 View  Result Date: 11/04/2020 CLINICAL DATA:  Altered mental status EXAM: CHEST  1 VIEW COMPARISON:  10/17/2020 FINDINGS: Cardiac shadow is enlarged but stable. Changes of prior TAVR are seen. Increased central vascular congestion is noted with changes of mild edema. No focal confluent infiltrate is seen. IMPRESSION: Early CHF.  No other focal abnormality is noted. Electronically Signed   By: Inez Catalina M.D.   On: 11/04/2020 01:47   CT Head Wo Contrast  Result Date: 11/03/2020 CLINICAL DATA:  Mental status changes EXAM: CT HEAD WITHOUT CONTRAST TECHNIQUE: Contiguous axial images were obtained from the base of the skull through the vertex without intravenous contrast. COMPARISON:  None. FINDINGS: Brain: No acute intracranial abnormality. Specifically, no hemorrhage, hydrocephalus, mass lesion, acute infarction, or significant intracranial injury. Vascular: No hyperdense vessel or unexpected calcification. Skull: No acute  calvarial abnormality. Sinuses/Orbits: No acute findings Other: None IMPRESSION: No acute intracranial abnormality. Electronically Signed   By: Rolm Baptise M.D.   On: 11/03/2020 22:25    ROS: All others negative except those listed in HPI.  Physical Exam: Vitals:   11/05/20 1041 11/05/20 1047 11/05/20 1205 11/05/20 1323  BP: (!) 167/66 (!) 167/66 (!) 190/73 (!) 139/57  Pulse: 82 80 81 80  Resp:      Temp: 98.4 F (36.9 C) 98.4 F (36.9 C) 98.8 F (37.1 C)   TempSrc: Oral Oral Oral   SpO2: (!) 84% 91% 100%   Weight:         General: WDWN male in NAD Head: NCAT sclera not icteric MMM Neck: Supple. No lymphadenopathy Lungs: CTA bilaterally. No wheeze, rales or rhonchi. Breathing is unlabored. Heart: RRR. No murmur, rubs or gallops.  Abdomen: soft, nontender, +BS, no guarding, no rebound tenderness Lower extremities:no edema b/l, R BKA Neuro: AAOx3. Moves all extremities spontaneously. Psych:  Responds to questions appropriately with a normal affect. Dialysis Access: RU AVF +b/t  Dialysis Orders:  [facility] MWF 4hrs 400/AF 1.5 2K 2Ca Profile 4 EDW 76.5kg RU AVF Hep 2400 Venofer 19mcg x5HD Mircera 18mcg on 8/17 Calcitriol 30mcg qHD  Assessment/Plan:  Confusion/hallucinations - CT negative.  Etiology unclear.  Slightly improved after dialysis yesterday.  Debility - PT recommending SNF  ESRD -  on HD MWF.  Plan for HD 8/29 per regular schedule.    Hypertension/volume  - BP variable. Continue home meds.  Does not appear volume overloaded.  Net UF 3L yesterday. Continue UF as tolerated.   Anemia of CKD - Hgb 9.2. ESA due 8/31.   Secondary Hyperparathyroidism -  Ca in goal.  Phos elevated, continue binders and VDRA.  Nutrition - Renal diet w/fluid restrictions CAD/CHF/severe AS s/p AVR PVD s/p R BKA  Jen Mow, PA-C [employer] 11/05/2020, 2:50 PM

## 2020-11-05 NOTE — Evaluation (Signed)
Physical Therapy Evaluation Patient Details Name: Jose Garrett MRN: [id number] DOB: January 18, 1951 Today's Date: 11/05/2020   History of Present Illness  70 y/o male presented to ED on 8/25 for confusion and hallucinations. Patient recently with cyst removal on L hand on 8/24. Suspect due to disequilibrium syndrome. PMH: HTN, ESRD on dialysis MWF, CAD, GERD, CHF, severe aortic stenosis s/p TAVR (10/2018), HLD, R BKA 04/2019.  Clinical Impression  Per daughter, patient uses w/c primarily for mobility during the day. Patient unable to provide accurate information due to confusion. Patient currently requiring minA for bed mobility and lateral scoot transfer to recliner to simulate w/c transfer. Patient with unsafe technique requiring cues and education. Patient presents with generalized weakness, impaired balance, decreased activity tolerance, impaired coordination, and impaired cognition. Patient will benefit from skilled PT services during acute stay to address listed deficits. Patient will require 24 hour supervision/assistance for safety due to impaired cognition. Recommend SNF at discharge to maximize functional independence and safety.     Follow Up Recommendations SNF;Supervision/Assistance - 24 hour    Equipment Recommendations  None recommended by PT    Recommendations for Other Services       Precautions / Restrictions Precautions Precautions: Fall Precaution Comments: recent L hand sx Restrictions Weight Bearing Restrictions: Yes LUE Weight Bearing:  (per patient, light WBing)      Mobility  Bed Mobility Overal bed mobility: Needs Assistance Bed Mobility: Supine to Sit;Sit to Supine     Supine to sit: Min assist Sit to supine: Supervision   General bed mobility comments: minA for trunk elevation. Patient able to scoot hips towards EOB without physical assistance    Transfers Overall transfer level: Needs assistance Equipment used: Rolling [name] (2  wheeled);None Transfers: Sit to/from Stand;Lateral/Scoot Transfers Sit to Stand: Mod assist        Lateral/Scoot Transfers: Min assist General transfer comment: modA to stand from low bed surface and cues for hand placement. MinA to lateral scoot bed<>chair. Patient with unsafe technique requiring cues for safety. Patient with uncontrolled return to bed requiring cues and education on safety to prevent fall or injury  Ambulation/Gait             General Gait Details: deferred  Stairs            Wheelchair Mobility    Modified Rankin (Stroke Patients Only)       Balance Overall balance assessment: Needs assistance Sitting-balance support: No upper extremity supported;Feet unsupported Sitting balance-Leahy Scale: Fair     Standing balance support: Bilateral upper extremity supported Standing balance-Leahy Scale: Zero Standing balance comment: maxA to maintain partial stand                             Pertinent Vitals/Pain Pain Assessment: Faces    Home Living Family/patient expects to be discharged to:: Private residence Living Arrangements: Children Available Help at Discharge: Family;Available PRN/intermittently Type of Home: House Home Access: Stairs to enter   Entrance Stairs-Number of Steps: 1 Home Layout: One level Home Equipment: [name] - 2 wheels;Wheelchair - manual      Prior Function Level of Independence: Needs assistance   Gait / Transfers Assistance Needed: primarily utilizes w/c during the day and rarely uses RW per daughter  ADL's / Homemaking Assistance Needed: patient states independent with ADLs, unsure of accuracy.        Hand Dominance        Extremity/Trunk Assessment   Upper  Extremity Assessment Upper Extremity Assessment: Defer to OT evaluation    Lower Extremity Assessment Lower Extremity Assessment: Generalized weakness;RLE deficits/detail;LLE deficits/detail RLE Sensation: decreased light touch RLE  Coordination: decreased gross motor LLE Sensation: decreased light touch LLE Coordination: decreased gross motor       Communication   Communication: No difficulties  Cognition Arousal/Alertness: Awake/alert Behavior During Therapy: WFL for tasks assessed/performed Overall Cognitive Status: Impaired/Different from baseline Area of Impairment: Orientation;Attention;Memory;Following commands;Safety/judgement;Awareness;Problem solving                 Orientation Level: Disoriented to;Time Current Attention Level: Sustained Memory: Decreased short-term memory Following Commands: Follows one step commands with increased time Safety/Judgement: Decreased awareness of deficits;Decreased awareness of safety Awareness: Emergent Problem Solving: Slow processing;Difficulty sequencing;Requires verbal cues General Comments: Poor awareness of safety and deficits. Patient has difficulty problem solving bed>chair transfer and requires verbal cueing. Intermittent outbursts of topics/things not appropriate for conversation at the time      General Comments      Exercises     Assessment/Plan    PT Assessment Patient needs continued PT services  PT Problem List Decreased strength;Decreased activity tolerance;Decreased mobility;Decreased balance;Decreased coordination;Decreased cognition;Decreased safety awareness;Decreased knowledge of precautions;Impaired sensation       PT Treatment Interventions DME instruction;Gait training;Functional mobility training;Therapeutic activities;Balance training;Therapeutic exercise;Patient/family education    PT Goals (Current goals can be found in the Care Plan section)  Acute Rehab PT Goals Patient Stated Goal: to go home PT Goal Formulation: With patient/family Time For Goal Achievement: 11/19/20 Potential to Achieve Goals: Fair    Frequency Min 2X/week   Barriers to discharge        Co-evaluation               AM-PAC PT "6 Clicks"  Mobility  Outcome Measure Help needed turning from your back to your side while in a flat bed without using bedrails?: A Little Help needed moving from lying on your back to sitting on the side of a flat bed without using bedrails?: A Little Help needed moving to and from a bed to a chair (including a wheelchair)?: A Little Help needed standing up from a chair using your arms (e.g., wheelchair or bedside chair)?: A Lot Help needed to walk in hospital room?: Total Help needed climbing 3-5 steps with a railing? : Total 6 Click Score: 13    End of Session Equipment Utilized During Treatment: Gait belt Activity Tolerance: Patient tolerated treatment well Patient left: in bed;with call bell/phone within reach;with bed alarm set (handoff to OT) Nurse Communication: Mobility status PT Visit Diagnosis: Unsteadiness on feet (R26.81);Muscle weakness (generalized) (M62.81);Difficulty in walking, not elsewhere classified (R26.2)    Time: 9449-6759 PT Time Calculation (min) (ACUTE ONLY): 36 min   Charges:   PT Evaluation $PT Eval Moderate Complexity: 1 Mod PT Treatments $Therapeutic Activity: 8-22 mins        Janisse Ghan A. Gilford Rile PT, DPT Acute Rehabilitation Services Pager [phone] Office [phone]   Linna Hoff 11/05/2020, 10:21 AM

## 2020-11-05 NOTE — Evaluation (Signed)
Occupational Therapy Evaluation Patient Details Name: Jose Garrett MRN: [id number] DOB: Jul 09, 1950 Today's Date: 11/05/2020    History of Present Illness 70 y/o male presented to ED on 8/25 for confusion and hallucinations. Patient recently with cyst removal on L hand on 8/24. Suspect due to disequilibrium syndrome. PMH: HTN, ESRD on dialysis MWF, CAD, GERD, CHF, severe aortic stenosis s/p TAVR (10/2018), HLD, R BKA 04/2019.   Clinical Impression   Pt admitted with above. He demonstrates the below listed deficits and will benefit from continued OT to maximize safety and independence with BADLs.  Pt currently presents to OT with generalized weakness, impaired coordination, significant cognitive deficits, decreased activity tolerance, impaired balance.  He currently requires mod - max A for ADLs, and mod A for functional transfers.  He lives with his daughter, who works during the day.  He was mod I with ADLs and functional transfers, per his report.  Recommend he have 24 hour assist at discharge due to impaired cognition and risk of fall.  Daughter works and is unable to provide current level of assistance, therefore, recommend SNF level rehab to maximize his safety and independence prior to return home.      Follow Up Recommendations  SNF    Equipment Recommendations  None recommended by OT    Recommendations for Other Services       Precautions / Restrictions Precautions Precautions: Fall Precaution Comments: recent L hand sx Restrictions Weight Bearing Restrictions: Yes LUE Weight Bearing:  (Per pt report he is to avoid lifting and avoid WBing through Lt wrist)      Mobility Bed Mobility Overal bed mobility: Needs Assistance Bed Mobility: Supine to Sit;Sit to Supine     Supine to sit: Mod assist Sit to supine: Supervision   General bed mobility comments: pt requires assist to lift trunk    Transfers Overall transfer level: Needs assistance Equipment used: Rolling walker  (2 wheeled);None Transfers: Sit to/from Stand;Lateral/Scoot Transfers Sit to Stand: Mod assist        Lateral/Scoot Transfers: Min assist General transfer comment: modA to stand from low bed surface and cues for hand placement. MinA to lateral scoot bed<>chair. Patient with unsafe technique requiring cues for safety. Patient with uncontrolled return to bed requiring cues and education on safety to prevent fall or injury    Balance Overall balance assessment: Needs assistance Sitting-balance support: No upper extremity supported;Feet unsupported Sitting balance-Leahy Scale: Fair     Standing balance support: Bilateral upper extremity supported Standing balance-Leahy Scale: Zero Standing balance comment: maxA to maintain partial stand                           ADL either performed or assessed with clinical judgement   ADL Overall ADL's : Needs assistance/impaired Eating/Feeding: Moderate assistance Eating/Feeding Details (indicate cue type and reason): Pt frequently drops items.  He attempts to drink from cup with bil. hands, but still demonstrated difficulty.  He was provided with a lidded mug and was able to drink from cup mod I Grooming: Wash/dry hands;Wash/dry face;Oral care;Brushing hair;Moderate assistance;Sitting   Upper Body Bathing: Moderate assistance;Sitting   Lower Body Bathing: Maximal assistance;Bed level;Sitting/lateral leans   Upper Body Dressing : Moderate assistance;Sitting   Lower Body Dressing: Maximal assistance;Sitting/lateral leans;Bed level Lower Body Dressing Details (indicate cue type and reason): Pt requires max assist to don socks as he is unable to pull sock over toes Toilet Transfer: Moderate assistance;Squat-pivot;BSC   Toileting- Clothing Manipulation  and Hygiene: Maximal assistance;Sitting/lateral lean       Functional mobility during ADLs: Moderate assistance;Maximal assistance;+2 for physical assistance;Rolling walker;Cueing for  safety       Vision Patient Visual Report: No change from baseline       Perception     Praxis      Pertinent Vitals/Pain Pain Assessment: Faces Faces Pain Scale: No hurt     Hand Dominance Left   Extremity/Trunk Assessment Upper Extremity Assessment Upper Extremity Assessment: Generalized weakness;RUE deficits/detail;LUE deficits/detail RUE Deficits / Details: Pt with jerky, myoclonic type movements as he fatigues.  He reports that he has had a progressive decrease in sensation of bil. hands. As he fatigues, he demonstrates decreased awareness of having items in his hand and frequently drops items RUE Sensation: decreased light touch RUE Coordination: decreased fine motor;decreased gross motor LUE Deficits / Details: Bulkly post op dressing in place.  Pt with jerky, myoclonic type movements as he fatigues.  He reports that he has had a progressive decrease in sensation of bil. hands. As he fatigues, he demonstrates decreased awareness of having items in his hand and frequently drops items LUE Sensation: decreased light touch LUE Coordination: decreased fine motor;decreased gross motor   Lower Extremity Assessment Lower Extremity Assessment: Generalized weakness;Defer to PT evaluation RLE Sensation: decreased light touch RLE Coordination: decreased gross motor LLE Sensation: decreased light touch LLE Coordination: decreased gross motor       Communication Communication Communication: No difficulties   Cognition Arousal/Alertness: Awake/alert Behavior During Therapy: WFL for tasks assessed/performed Overall Cognitive Status: Impaired/Different from baseline Area of Impairment: Orientation;Attention;Memory;Following commands;Safety/judgement;Awareness;Problem solving                 Orientation Level: Disoriented to;Time Current Attention Level: Sustained Memory: Decreased short-term memory Following Commands: Follows one step commands with increased  time Safety/Judgement: Decreased awareness of deficits;Decreased awareness of safety Awareness: Emergent Problem Solving: Slow processing;Difficulty sequencing;Requires verbal cues General Comments: Pt dioriented to day of the week and time of day.  He demonstrates decreased recall of info.  he requires mod cues for problem solving through familiar tasks.  He demonstrates progressive decrease in attention as he fatigues   General Comments  Long discussion with pt re: recommendation for short SNF stay for rehab.  He was reluctantly agreeable    Exercises     Shoulder Instructions      Home Living Family/patient expects to be discharged to:: Private residence Living Arrangements: Children Available Help at Discharge: Family;Available PRN/intermittently Type of Home: House Home Access: Stairs to enter [employer] of Steps: 1   Home Layout: One level     Bathroom Shower/Tub: Teacher, early years/pre: Standard     Home Equipment: Environmental consultant - 2 wheels;Wheelchair - manual   Additional Comments: Pt reports he lives with daughter who works during the day.  His ex wife lives close by, and pt reports he can call her if he needs assist, however, daughter reports her ability to assist is limited      Prior Functioning/Environment Level of Independence: Needs assistance  Gait / Transfers Assistance Needed: primarily utilizes w/c during the day and rarely uses RW per daughter ADL's / Homemaking Assistance Needed: patient states independent with ADLs, unsure of accuracy.            OT Problem List: Decreased strength;Decreased activity tolerance;Impaired balance (sitting and/or standing);Decreased cognition;Decreased safety awareness;Decreased knowledge of use of DME or AE;Impaired sensation;Impaired UE functional use      OT  Treatment/Interventions: Self-care/ADL training;Therapeutic exercise;DME and/or AE instruction;Therapeutic activities;Cognitive  remediation/compensation;Patient/family education;Balance training    OT Goals(Current goals can be found in the care plan section) Acute Rehab OT Goals Patient Stated Goal: to go home; and to be able to take care of self OT Goal Formulation: With patient Time For Goal Achievement: 11/18/20 Potential to Achieve Goals: Good  OT Frequency: Min 2X/week   Barriers to D/C: Decreased caregiver support (daughter works during the day)          Co-evaluation              AM-PAC OT "6 Clicks" Daily Activity     Outcome Measure Help from another person eating meals?: A Lot Help from another person taking care of personal grooming?: A Lot Help from another person toileting, which includes using toliet, bedpan, or urinal?: A Lot Help from another person bathing (including washing, rinsing, drying)?: A Lot Help from another person to put on and taking off regular upper body clothing?: A Lot Help from another person to put on and taking off regular lower body clothing?: A Lot 6 Click Score: 12   End of Session Nurse Communication: Mobility status  Activity Tolerance: Patient limited by fatigue Patient left: in bed;with call bell/phone within reach;with bed alarm set  OT Visit Diagnosis: Unsteadiness on feet (R26.81);Cognitive communication deficit (R41.841)                Time: 2500-3704 OT Time Calculation (min): 29 min Charges:  OT General Charges $OT Visit: 1 Visit OT Evaluation $OT Eval Moderate Complexity: 1 Mod OT Treatments $Self Care/Home Management : 8-22 mins  Nilsa Nutting., OTR/L Acute Rehabilitation Services Pager [phone] Office [place], [place] 11/05/2020, 10:42 AM

## 2020-11-05 NOTE — Progress Notes (Signed)
PROGRESS NOTE    Jose Garrett  BZJ:[id number] DOB: March 21, 1950 DOA: 11/03/2020 PCP: [name], NP    Brief Narrative:  ESRD hemodialysis Monday Wednesday Friday, chronic combined heart failure, severe aortic stenosis status post TAVR, hypertension, hyperlipidemia, GERD, right BKA, excisional biopsy of the left forearm lesion 8/24 brought to the ER with worsening disorientation, lethargy and hallucinations.  Lives at home with daughter.  Work-up so far negative.  Continue to exhibit confusion and disorientation so admitted. Patient received general anesthesia on 8/24 for cyst excision.  Probably encephalopathy due to medications, opiates and benzodiazepines.  Hemodynamically stable.  Neurologically stable.    Assessment & Plan:   Principal Problem:   Acute metabolic encephalopathy Active Problems:   Essential hypertension, benign   ESRD (end stage renal disease) on dialysis (HCC)   Coronary artery disease involving native coronary artery of native heart without angina pectoris   GERD without esophagitis   Chronic combined systolic and diastolic CHF (congestive heart failure) (HCC)   Mixed hyperlipidemia  Acute metabolic encephalopathy: Probably due to general anesthesia in a patient with hemodialysis.  No focal deficits.  Currently gradually improving.  Will stay off opiates.  Low-dose benzodiazepine resume.  ESRD on hemodialysis: Received dialysis on 8/26.  Next dialysis Monday.  Extreme debility, right BKA status and now with left hand surgery: Work with PT OT.  They recommended SNF for rehab.  Will refer.  Essential hypertension: Blood pressure stable on current regimen.  GERD without esophagitis: On Protonix.  Continue.  Chronic combined heart failure: Currently euvolemic.  Managed with dialysis.  Hyperlipidemia: On a statin.   DVT prophylaxis: heparin injection 5,000 Units Start: 11/04/20 1400   Code Status: Full code Family Communication: Unable to talk to patient's  daughter Disposition Plan: Status is: Observation  The patient will require care spanning > 2 midnights and should be moved to inpatient because: Unsafe d/c plan  Dispo: The patient is from: Home              Anticipated d/c is to: SNF              Patient currently is medically stable to d/c.   Difficult to place patient No         Consultants:  Nephrology  Procedures:  Routine hemodialysis 8/26  Antimicrobials:  None   Subjective: Patient seen and examined.  Poor historian.  No overnight events.  Patient himself denies any complaints.  He was not sure how he is going to manual work.  He tells me that he has a wheelchair and being helped by his daughter at home.  Unable to reach to patient's daughter, will attempt.  Objective: Vitals:   11/05/20 1041 11/05/20 1047 11/05/20 1205 11/05/20 1323  BP: (!) 167/66 (!) 167/66 (!) 190/73 (!) 139/57  Pulse: 82 80 81 80  Resp:      Temp: 98.4 F (36.9 C) 98.4 F (36.9 C) 98.8 F (37.1 C)   TempSrc: Oral Oral Oral   SpO2: (!) 84% 91% 100%   Weight:        Intake/Output Summary (Last 24 hours) at 11/05/2020 1356 Last data filed at 11/04/2020 2330 Gross per 24 hour  Intake 30 ml  Output 3000 ml  Net -2970 ml   Filed Weights   11/04/20 1424 11/04/20 1741  Weight: 76.9 kg 73.8 kg    Examination:  General exam: Appears calm and comfortable  Debilitated gentleman.  Not in any distress.  Chronically sick looking but comfortable  at rest. Respiratory system: Clear to auscultation. Respiratory effort normal.  No added sounds. Cardiovascular system: S1 & S2 heard, RRR.  Gastrointestinal system: Soft and nontender.   Central nervous system: Alert and awake.  No focal deficits. Extremities: Symmetric 5 x 5 power.  Generalized weakness. Left forearm with dressing and compression bandages present. Right below-knee amputation stump clean and dry. AV fistula present upper extremity.    Data Reviewed: I have personally  reviewed following labs and imaging studies  CBC: Recent Labs  Lab 11/02/20 1244 11/03/20 2144 11/04/20 0130 11/04/20 0143  WBC  --  7.9 9.4  --   NEUTROABS  --  6.3 7.5  --   HGB 8.5* 9.3* 9.3* 9.2*  HCT 25.0* 28.8* 29.0* 27.0*  MCV  --  96.6 97.3  --   PLT  --  133* 129*  --    Basic Metabolic Panel: Recent Labs  Lab 11/02/20 1244 11/03/20 2144 11/04/20 0130 11/04/20 0143  NA 137 137 138 137  K 3.2* 3.7 3.5 3.5  CL 94* 93* 94*  --   CO2  --  27 25  --   GLUCOSE 144* 106* 102*  --   BUN 23 40* 42*  --   CREATININE 6.50* 9.05* 9.36*  --   CALCIUM  --  9.6 9.5  --   MG  --   --  2.1  --   PHOS  --   --  8.4*  --    GFR: Estimated Creatinine Clearance: 7.8 mL/min (A) (by C-G formula based on SCr of 9.36 mg/dL (H)). Liver Function Tests: Recent Labs  Lab 11/03/20 2144 11/04/20 0130  AST 19 18  ALT 7 6  ALKPHOS 51 50  BILITOT 0.7 0.7  PROT 6.6 6.8  ALBUMIN 3.7 3.5   No results for input(s): LIPASE, AMYLASE in the last 168 hours. Recent Labs  Lab 11/04/20 0130  AMMONIA 14   Coagulation Profile: No results for input(s): INR, PROTIME in the last 168 hours. Cardiac Enzymes: No results for input(s): CKTOTAL, CKMB, CKMBINDEX, TROPONINI in the last 168 hours. BNP (last 3 results) No results for input(s): PROBNP in the last 8760 hours. HbA1C: No results for input(s): HGBA1C in the last 72 hours. CBG: No results for input(s): GLUCAP in the last 168 hours. Lipid Profile: No results for input(s): CHOL, HDL, LDLCALC, TRIG, CHOLHDL, LDLDIRECT in the last 72 hours. Thyroid Function Tests: Recent Labs    11/04/20 0130  TSH 0.829   Anemia Panel: Recent Labs    11/04/20 0130  VITAMINB12 264  FOLATE 15.9   Sepsis Labs: Recent Labs  Lab 11/04/20 1430  PROCALCITON 0.20    Recent Results (from the past 240 hour(s))  Aerobic/Anaerobic Culture w Gram Stain (surgical/deep wound)     Status: None (Preliminary result)   Collection Time: 11/02/20  2:24 PM    Specimen: Soft Tissue, Other  Result Value Ref Range Status   Specimen Description WOUND LEFT WRIST  Final   Special Requests MASS  Final   Gram Stain   Final    NO SQUAMOUS EPITHELIAL CELLS SEEN NO WBC SEEN FEW GRAM NEGATIVE RODS Performed at [facility], [place] [address], [place], [place] [address]    Culture   Final    RARE NORMAL SKIN FLORA NO ANAEROBES ISOLATED; CULTURE IN PROGRESS FOR 5 DAYS    Report Status PENDING  Incomplete  Resp Panel by RT-PCR (Flu A&B, Covid) Nasopharyngeal Swab     Status:  None   Collection Time: 11/04/20 12:07 AM   Specimen: Nasopharyngeal Swab; Nasopharyngeal(NP) swabs in vial transport medium  Result Value Ref Range Status   SARS Coronavirus 2 by RT PCR NEGATIVE NEGATIVE Final    Comment: (NOTE) SARS-CoV-2 target nucleic acids are NOT DETECTED.  The SARS-CoV-2 RNA is generally detectable in upper respiratory specimens during the acute phase of infection. The lowest concentration of SARS-CoV-2 viral copies this assay can detect is 138 copies/mL. A negative result does not preclude SARS-Cov-2 infection and should not be used as the sole basis for treatment or other patient management decisions. A negative result may occur with  improper specimen collection/handling, submission of specimen other than nasopharyngeal swab, presence of viral mutation(s) within the areas targeted by this assay, and inadequate number of viral copies(<138 copies/mL). A negative result must be combined with clinical observations, patient history, and epidemiological information. The expected result is Negative.  Fact Sheet for Patients:  EntrepreneurPulse.com.au  Fact Sheet for Healthcare Providers:  IncredibleEmployment.be  This test is no t yet approved or cleared by the [place] FDA and  has been authorized for detection and/or diagnosis of SARS-CoV-2 by FDA under an Emergency Use Authorization (EUA). This EUA will  remain  in effect (meaning this test can be used) for the duration of the COVID-19 declaration under Section 564(b)(1) of the Act, 21 U.S.C.section 360bbb-3(b)(1), unless the authorization is terminated  or revoked sooner.       Influenza A by PCR NEGATIVE NEGATIVE Final   Influenza B by PCR NEGATIVE NEGATIVE Final    Comment: (NOTE) The Xpert Xpress SARS-CoV-2/FLU/RSV plus assay is intended as an aid in the diagnosis of influenza from Nasopharyngeal swab specimens and should not be used as a sole basis for treatment. Nasal washings and aspirates are unacceptable for Xpert Xpress SARS-CoV-2/FLU/RSV testing.  Fact Sheet for Patients: EntrepreneurPulse.com.au  Fact Sheet for Healthcare Providers: IncredibleEmployment.be  This test is not yet approved or cleared by the [place] FDA and has been authorized for detection and/or diagnosis of SARS-CoV-2 by FDA under an Emergency Use Authorization (EUA). This EUA will remain in effect (meaning this test can be used) for the duration of the COVID-19 declaration under Section 564(b)(1) of the Act, 21 U.S.C. section 360bbb-3(b)(1), unless the authorization is terminated or revoked.  Performed at [facility], [place] [address] [place], [place] [address]          Radiology Studies: DG Chest 1 View  Result Date: 11/04/2020 CLINICAL DATA:  Altered mental status EXAM: CHEST  1 VIEW COMPARISON:  10/17/2020 FINDINGS: Cardiac shadow is enlarged but stable. Changes of prior TAVR are seen. Increased central vascular congestion is noted with changes of mild edema. No focal confluent infiltrate is seen. IMPRESSION: Early CHF.  No other focal abnormality is noted. Electronically Signed   By: Inez Catalina M.D.   On: 11/04/2020 01:47   CT Head Wo Contrast  Result Date: 11/03/2020 CLINICAL DATA:  Mental status changes EXAM: CT HEAD WITHOUT CONTRAST TECHNIQUE: Contiguous axial images were obtained from  the base of the skull through the vertex without intravenous contrast. COMPARISON:  None. FINDINGS: Brain: No acute intracranial abnormality. Specifically, no hemorrhage, hydrocephalus, mass lesion, acute infarction, or significant intracranial injury. Vascular: No hyperdense vessel or unexpected calcification. Skull: No acute calvarial abnormality. Sinuses/Orbits: No acute findings Other: None IMPRESSION: No acute intracranial abnormality. Electronically Signed   By: Rolm Baptise M.D.   On: 11/03/2020 22:25  Scheduled Meds:  amiodarone  200 mg Oral Daily   amLODipine  10 mg Oral Daily   atorvastatin  80 mg Oral Q supper   Chlorhexidine Gluconate Cloth  6 each Topical Q0600   clopidogrel  75 mg Oral Daily   heparin  5,000 Units Subcutaneous Q8H   lanthanum  2,000 mg Oral TID WC   pantoprazole  40 mg Oral Daily   Continuous Infusions:   LOS: 0 days    Time spent: 25 minutes    Barb Merino, MD [facility] Pager [phone]

## 2020-11-05 NOTE — Social Work (Signed)
CSW attempted to call pts daughter to complete TOC initial assessment. Daughter did not answer, CSW was unable to  leave VM.   Emeterio Reeve, [place] Clinical Social Worker [phone]

## 2020-11-05 NOTE — Progress Notes (Signed)
Occupational Therapy Progress Note  Pt seen for use of adaptive equipment to improve his ability to self feed, however, pt unable to manipulate the AE, and seemed confused by it.  He was able to self feed with moderate assist.    11/05/20 1300  OT Visit Information  Last OT Received On 11/05/20  Assistance Needed +1  History of Present Illness 70 y/o male presented to ED on 8/25 for confusion and hallucinations. Patient recently with cyst removal on L hand on 8/24. Suspect due to disequilibrium syndrome. PMH: HTN, ESRD on dialysis MWF, CAD, GERD, CHF, severe aortic stenosis s/p TAVR (10/2018), HLD, R BKA 04/2019.  Precautions  Precautions Fall  Precaution Comments recent L hand sx  Pain Assessment  Pain Assessment Faces  Faces Pain Scale 0  Cognition  Arousal/Alertness Lethargic;Awake/alert  Behavior During Therapy [facility] for tasks assessed/performed  Overall Cognitive Status Impaired/Different from baseline  Area of Impairment Orientation;Attention;Memory;Following commands;Safety/judgement;Awareness;Problem solving  Orientation Level Disoriented to;Time  Current Attention Level Sustained  Memory Decreased short-term memory  Following Commands Follows one step commands with increased time  Safety/Judgement Decreased awareness of deficits;Decreased awareness of safety  Awareness Emergent  Problem Solving Slow processing;Difficulty sequencing;Requires verbal cues  General Comments pt with poor attention and decreased awareness of surroundings - attempting to eat with a utensil multiple times when no utensil present in his hand  Upper Extremity Assessment  Upper Extremity Assessment Generalized weakness  RUE Deficits / Details Pt with jerky, myoclonic type movements as he fatigues.  He reports that he has had a progressive decrease in sensation of bil. hands. As he fatigues, he demonstrates decreased awareness of having items in his hand and frequently drops items  RUE Sensation decreased  light touch  RUE Coordination decreased fine motor;decreased gross motor  LUE Deficits / Details Bulkly post op dressing in place.  Pt with jerky, myoclonic type movements as he fatigues.  He reports that he has had a progressive decrease in sensation of bil. hands. As he fatigues, he demonstrates decreased awareness of having items in his hand and frequently drops items  LUE Sensation decreased light touch  LUE Coordination decreased fine motor;decreased gross motor  Lower Extremity Assessment  Lower Extremity Assessment Defer to PT evaluation  ADL  Overall ADL's  Needs assistance/impaired  Eating/Feeding Moderate assistance  Eating/Feeding Details (indicate cue type and reason) Pt able to drink from lidded mug.  He picked up a spoon, placed it in his lap next to his Lt UE, then proceeded to attempt to eat from it in his Rt hand with no awareness that there was no spoon, or utensil in his Rt hand.  He did this multiple times requiring cues to realize he was not holding a utensil.  Attempted to use a red foam handle to improve his ability to manipulate his utensil, however, this seemed to confuse him.  Once his awareness and attention improved, he was able to feed himself ~50% of his meal with supervision and min cues.  OT - End of Session  Activity Tolerance Patient tolerated treatment well  Patient left in bed;with call bell/phone within reach;with bed alarm set  Nurse Communication Mobility status  OT Assessment/Plan  OT Plan Discharge plan remains appropriate  OT Visit Diagnosis Unsteadiness on feet (R26.81);Cognitive communication deficit (R41.841)  OT Frequency (ACUTE ONLY) Min 2X/week  Follow Up Recommendations SNF  OT Equipment None recommended by OT  AM-PAC OT "6 Clicks" Daily Activity Outcome Measure (Version 2)  Help from another person  eating meals? 2  Help from another person taking care of personal grooming? 2  Help from another person toileting, which includes using toliet,  bedpan, or urinal? 2  Help from another person bathing (including washing, rinsing, drying)? 2  Help from another person to put on and taking off regular upper body clothing? 2  Help from another person to put on and taking off regular lower body clothing? 2  6 Click Score 12  Progressive Mobility  What is the highest level of mobility based on the progressive mobility assessment? Level 2 (Chairfast) - Balance while sitting on edge of bed and cannot stand  Mobility Out of bed for toileting;Out of bed to chair with meals  OT Goal Progression  Progress towards OT goals Progressing toward goals  OT Time Calculation  OT Start Time (ACUTE ONLY) 1244  OT Stop Time (ACUTE ONLY) 1259  OT Time Calculation (min) 15 min  OT General Charges  $OT Visit 1 Visit  OT Treatments  $Self Care/Home Management  8-22 mins  Nilsa Nutting., OTR/L Acute Rehabilitation Services Pager [phone] Office [phone]

## 2020-11-05 NOTE — Plan of Care (Signed)

## 2020-11-05 NOTE — TOC Initial Note (Signed)
Transition of Care [facility]) - Initial/Assessment Note    Patient Details  Name: Jose Garrett MRN: [id number] Date of Birth: [birth year]  Transition of Care [facility]) CM/SW Contact:    Coralee Pesa, [place] Phone Number: 11/05/2020, 4:25 PM  Clinical Narrative:                 CSW spoke with pt's daughter who was at bedside. She said they are agreeable to SNF, and have no preference at this time. Medicare.gov info was given. Pt has had 2 covid vaccines and 2 boosters. Pt has been independent prior to admission and resides with daughter. CSW was given permission [place]. Pt noted that he would only like to be there for two weeks and then discharge back home. TOC will continue to follow for dc planning.  Expected Discharge Plan: Skilled Nursing Facility Barriers to Discharge: SNF Pending bed offer, Continued Medical Work up   Patient Goals and CMS Choice Patient states their goals for this hospitalization and ongoing recovery are:: Pt would like to reach independence again. CMS Medicare.gov Compare Post Acute Care list provided to:: Patient Represenative (must comment) [name], Daughter) Choice offered to / list presented to : Patient, Adult Children  Expected Discharge Plan and Services Expected Discharge Plan: [place] Acute Care Choice: [place] Living arrangements for the past 2 months: Single Family Home                                      Prior Living Arrangements/Services Living arrangements for the past 2 months: Single Family Home Lives with:: Adult Children Patient language and need for interpreter reviewed:: Yes Do you feel safe going back to the place where you live?: Yes      Need for Family Participation in Patient Care: Yes (Comment) Care giver support system in place?: Yes (comment)   Criminal Activity/Legal Involvement Pertinent to Current Situation/Hospitalization: No - Comment as  needed  Activities of Daily Living      Permission Sought/Granted Permission sought to share information with : Family Supports Permission granted to share information with : Yes, Verbal Permission Granted  Share Information with NAME: Alysia Penna     Permission granted to share info w Relationship: Daughter  Permission granted to share info w Contact Information: [phone]  Emotional Assessment Appearance:: Appears stated age Attitude/Demeanor/Rapport: Engaged Affect (typically observed): Pleasant Orientation: : Oriented to Self, Oriented to Place, Oriented to  Time, Oriented to Situation Alcohol / Substance Use: Not Applicable Psych Involvement: No (comment)  Admission diagnosis:  Confusion [R41.0] Pneumonia [G18.2] Toxic metabolic encephalopathy [X93.7] Altered mental status [R41.82] Patient Active Problem List   Diagnosis Date Noted   Altered mental status 16/96/7893   Acute metabolic encephalopathy 81/03/7508   Mixed hyperlipidemia 11/04/2020   Volume overload 10/16/2020   Acute hypoxemic respiratory failure ([place]) 10/16/2020   Hypertensive urgency 10/16/2020   Pain due to onychomycosis of toenail of left foot 08/30/2020   Lumbar spondylosis 01/23/2020   Neuropathy 10/06/2019   Gait abnormality 08/06/2019   Back muscle spasm 07/09/2019   Sleep disturbance    Nausea without vomiting    Orthostatic hypotension    Acute on chronic anemia    Chronic combined systolic and diastolic CHF (congestive heart failure) ([place])    ESRD on dialysis ([place])    Right below-knee amputee ([place])  05/18/2019   Pressure injury of skin 05/14/2019   Enterococcal bacteremia 05/13/2019   Headache, unspecified 01/12/2019   Allergy, unspecified, initial encounter 12/29/2018   Polymorphic ventricular tachycardia ([place]) 10/24/2018   S/P TAVR (transcatheter aortic valve replacement) 10/21/2018   Anxiety    GERD without esophagitis    Coronary artery disease involving native coronary artery of  native heart without angina pectoris    Thrombocytopenia (HCC)    PAD (peripheral artery disease) ([place]) 07/22/2018   Acute on chronic combined systolic and diastolic CHF (congestive heart failure) ([place]) 11/30/2017   Hypercalcemia 06/24/2017   Encounter for removal of sutures 08/15/2016   Other mechanical complication of surgically created arteriovenous fistula, subsequent encounter 06/22/2016   Unspecified protein-calorie malnutrition ([place]) 02/06/2016   Secondary hyperparathyroidism of renal origin ([place]) 02/06/2016   Pruritus, unspecified 02/06/2016   Other specified coagulation defects ([place]) 02/06/2016   Hypokalemia 02/06/2016   Diarrhea, unspecified 38/25/0539   Complication of vascular dialysis catheter 02/06/2016   Aftercare including intermittent dialysis ([place]) 02/06/2016   ESRD (end stage renal disease) on dialysis [facility])    Iron deficiency anemia 11/22/2015   AL amyloidosis ([place]) 09/25/2014   Anemia of chronic disease 09/25/2014   Essential hypertension, benign 11/06/2013   PCP:  [name], NP Pharmacy:   [place] [address] Phone: [phone] Fax: [place] [address] Phone: [phone] Fax: [phone]  [facility] - [place], [place] - [address] [place], [place] [address] Phone: [phone] Fax: [phone]     Social Determinants of Health (SDOH) Interventions    Readmission Risk Interventions Readmission Risk Prevention Plan 04/15/2019 10/23/2018 08/20/2018  Transportation Screening Complete Complete -  PCP or Specialist Appt within 3-5 Days - Complete -  HRI or [place] - Complete -  Social Work Consult for [place] Planning/Counseling - Complete -  Palliative Care Screening - Not Applicable -  Medication Review Press photographer) Complete Complete  -  PCP or Specialist appointment within 3-5 days of discharge Complete - Complete  HRI or Home Care Consult Complete - Complete  SW Recovery Care/Counseling Consult Complete - Complete  Palliative Care Screening Not Applicable - Not [place] Patient Refused - Not Applicable  Some recent data might be hidden

## 2020-11-06 LAB — RENAL FUNCTION PANEL
Albumin: 3 g/dL — ABNORMAL LOW (ref 3.5–5.0)
Anion gap: 13 (ref 5–15)
BUN: 45 mg/dL — ABNORMAL HIGH (ref 8–23)
CO2: 26 mmol/L (ref 22–32)
Calcium: 9.3 mg/dL (ref 8.9–10.3)
Chloride: 97 mmol/L — ABNORMAL LOW (ref 98–111)
Creatinine, Ser: 9.53 mg/dL — ABNORMAL HIGH (ref 0.61–1.24)
GFR, Estimated: 5 mL/min — ABNORMAL LOW (ref >60)
Glucose, Bld: 109 mg/dL — ABNORMAL HIGH (ref 70–99)
Phosphorus: 6.5 mg/dL — ABNORMAL HIGH (ref 2.5–4.6)
Potassium: 3.9 mmol/L (ref 3.5–5.1)
Sodium: 136 mmol/L (ref 135–145)

## 2020-11-06 LAB — CBC WITH DIFFERENTIAL/PLATELET
Abs Immature Granulocytes: 0.06 10*3/uL (ref 0.00–0.07)
Basophils Absolute: 0 10*3/uL (ref 0.0–0.1)
Basophils Relative: 1 %
Eosinophils Absolute: 0.2 10*3/uL (ref 0.0–0.5)
Eosinophils Relative: 3 %
HCT: 26 % — ABNORMAL LOW (ref 39.0–52.0)
Hemoglobin: 8.3 g/dL — ABNORMAL LOW (ref 13.0–17.0)
Immature Granulocytes: 1 %
Lymphocytes Relative: 12 %
Lymphs Abs: 0.8 10*3/uL (ref 0.7–4.0)
MCH: 30.5 pg (ref 26.0–34.0)
MCHC: 31.9 g/dL (ref 30.0–36.0)
MCV: 95.6 fL (ref 80.0–100.0)
Monocytes Absolute: 0.5 10*3/uL (ref 0.1–1.0)
Monocytes Relative: 7 %
Neutro Abs: 4.9 10*3/uL (ref 1.7–7.7)
Neutrophils Relative %: 76 %
Platelets: 123 10*3/uL — ABNORMAL LOW (ref 150–400)
RBC: 2.72 MIL/uL — ABNORMAL LOW (ref 4.22–5.81)
RDW: 16.3 % — ABNORMAL HIGH (ref 11.5–15.5)
WBC: 6.4 10*3/uL (ref 4.0–10.5)
nRBC: 0 % (ref 0.0–0.2)

## 2020-11-06 NOTE — NC FL2 (Signed)
[place] LEVEL OF CARE SCREENING TOOL     IDENTIFICATION  Patient Name: Jose Garrett Birthdate: [birth year] Sex: male Admission Date (Current Location): 11/03/2020  [facility] and [place] Number:  Herbalist and Address:  [facility] [place], [place] [address], [place], [place] [address]      Provider Number: [id number]  Attending Physician Name and Address:  Nita Sells, MD  Relative Name and Phone Number:       Current Level of Care: Hospital Recommended Level of Care: [place] Prior Approval Number:    Date Approved/Denied:   PASRR Number: [id number]  Discharge Plan: Home    Current Diagnoses: Patient Active Problem List   Diagnosis Date Noted   Altered mental status 70/03/7492   Acute metabolic encephalopathy 49/67/5916   Mixed hyperlipidemia 11/04/2020   Volume overload 10/16/2020   Acute hypoxemic respiratory failure ([place]) 10/16/2020   Hypertensive urgency 10/16/2020   Pain due to onychomycosis of toenail of left foot 08/30/2020   Lumbar spondylosis 01/23/2020   Neuropathy 10/06/2019   Gait abnormality 08/06/2019   Back muscle spasm 07/09/2019   Sleep disturbance    Nausea without vomiting    Orthostatic hypotension    Acute on chronic anemia    Chronic combined systolic and diastolic CHF (congestive heart failure) ([place])    ESRD on dialysis ([place])    Right below-knee amputee ([place]) 05/18/2019   Pressure injury of skin 05/14/2019   Enterococcal bacteremia 05/13/2019   Headache, unspecified 01/12/2019   Allergy, unspecified, initial encounter 12/29/2018   Polymorphic ventricular tachycardia ([place]) 10/24/2018   S/P TAVR (transcatheter aortic valve replacement) 10/21/2018   Anxiety    GERD without esophagitis    Coronary artery disease involving native coronary artery of native heart without angina pectoris    Thrombocytopenia (HCC)    PAD (peripheral artery disease) ([place]) 07/22/2018   Acute on  chronic combined systolic and diastolic CHF (congestive heart failure) ([place]) 11/30/2017   Hypercalcemia 06/24/2017   Encounter for removal of sutures 08/15/2016   Other mechanical complication of surgically created arteriovenous fistula, subsequent encounter 06/22/2016   Unspecified protein-calorie malnutrition ([place]) 02/06/2016   Secondary hyperparathyroidism of renal origin ([place]) 02/06/2016   Pruritus, unspecified 02/06/2016   Other specified coagulation defects ([place]) 02/06/2016   Hypokalemia 02/06/2016   Diarrhea, unspecified 38/46/6599   Complication of vascular dialysis catheter 02/06/2016   Aftercare including intermittent dialysis ([place]) 02/06/2016   ESRD (end stage renal disease) on dialysis [facility])    Iron deficiency anemia 11/22/2015   AL amyloidosis ([place]) 09/25/2014   Anemia of chronic disease 09/25/2014   Essential hypertension, benign 11/06/2013    Orientation RESPIRATION BLADDER Height & Weight     Self  Normal Incontinent Weight: 162 lb 11.2 oz (73.8 kg) Height:     BEHAVIORAL SYMPTOMS/MOOD NEUROLOGICAL BOWEL NUTRITION STATUS      Incontinent Diet  AMBULATORY STATUS COMMUNICATION OF NEEDS Skin   Limited Assist Verbally Normal                       Personal Care Assistance Level of Assistance  Bathing, Feeding, Dressing Bathing Assistance: Limited assistance Feeding assistance: Limited assistance Dressing Assistance: Limited assistance     Functional Limitations Info  Sight, Hearing, Speech Sight Info: Adequate Hearing Info: Adequate Speech Info: Adequate    SPECIAL CARE FACTORS FREQUENCY  PT (By licensed PT), OT (By licensed OT)     PT Frequency: 5x a week OT  Frequency: 5x a week            Contractures Contractures Info: Not present    Additional Factors Info  Code Status, Allergies Code Status Info: Full Allergies Info: NKA           Current Medications (11/06/2020):  This is the current hospital active medication list Current  Facility-Administered Medications  Medication Dose Route Frequency Provider Last Rate Last Admin   acetaminophen (TYLENOL) tablet 650 mg  650 mg Oral Q6H PRN Shalhoub, Sherryll Burger, MD       Or   acetaminophen (TYLENOL) suppository 650 mg  650 mg Rectal Q6H PRN Shalhoub, Sherryll Burger, MD       amiodarone (PACERONE) tablet 200 mg  200 mg Oral Daily Shalhoub, Sherryll Burger, MD   200 mg at 11/05/20 1131   amLODipine (NORVASC) tablet 10 mg  10 mg Oral Daily Vernelle Emerald, MD   10 mg at 11/05/20 1130   atorvastatin (LIPITOR) tablet 80 mg  80 mg Oral Q supper Vernelle Emerald, MD   80 mg at 11/05/20 1749   Chlorhexidine Gluconate Cloth 2 % PADS 6 each  6 each Topical Q0600 Penninger, Ria Comment, [place]   6 each at 11/06/20 2500   clopidogrel (PLAVIX) tablet 75 mg  75 mg Oral Daily Vernelle Emerald, MD   75 mg at 11/05/20 1131   heparin injection 5,000 Units  5,000 Units Subcutaneous Q8H Shalhoub, Sherryll Burger, MD   5,000 Units at 11/06/20 3704   hydrALAZINE (APRESOLINE) injection 10 mg  10 mg Intravenous Q6H PRN Vernelle Emerald, MD   10 mg at 11/05/20 1209   lanthanum (FOSRENOL) chewable tablet 2,000 mg  2,000 mg Oral TID WC Shalhoub, Sherryll Burger, MD   2,000 mg at 11/05/20 1749   LORazepam (ATIVAN) tablet 1 mg  1 mg Oral BID PRN Vernelle Emerald, MD       ondansetron [facility]) tablet 4 mg  4 mg Oral Q6H PRN Shalhoub, Sherryll Burger, MD       Or   ondansetron [facility]) injection 4 mg  4 mg Intravenous Q6H PRN Shalhoub, Sherryll Burger, MD   4 mg at 11/05/20 0926   pantoprazole (PROTONIX) EC tablet 40 mg  40 mg Oral Daily Shalhoub, Sherryll Burger, MD   40 mg at 11/05/20 1130   polyethylene glycol (MIRALAX / GLYCOLAX) packet 17 g  17 g Oral Daily PRN Shalhoub, Sherryll Burger, MD         Discharge Medications: Please see discharge summary for a list of discharge medications.  Relevant Imaging Results:  Relevant Lab Results:   Additional Information SSN: [id number], covid vaccinated plus booster  Coralee Pesa, LCSWA

## 2020-11-06 NOTE — Plan of Care (Signed)

## 2020-11-06 NOTE — Progress Notes (Signed)
PROGRESS NOTE   Jose Garrett  VWU:[id number] DOB: 1950-07-07 DOA: 11/03/2020 PCP: [name], NP  Brief Narrative:   70 year old white male ESRD MWF [facility], systolic/diastolic heart CHF EF 29-56% 10/2019--last cath total RCA occlusion medical management, AoS s/p TAVR 10/2018, HTN, HLD,?  History of multiple myeloma PVD status post BKA Recent symptomatic left dorsal/palmar wrist masses status post excisional biopsies by Dr. Burney Gauze 11/02/2020--sent home on OxyContin-apparently took 2 pills  Started hallucinating at home (talking to the TV) Work-up in emergency room revealed unremarkable CT head chemistry CBC Patient still continues to exhibit confusion disorientation in the setting of mild lethargy Started on low-dose IV fluids--confusion felt to be secondary to opiate medications in addition to general anesthesia 8/26-nephrology consulted dialyzed PT OT recommends skilled placement upon discharge    Hospital-Problem based course  Metabolic encephalopathy + hallucinations secondary to opiates/anesthesia in the setting of ESRD No more opiates-pain control with Tylenol alone Wrist masses status post biopsies Defer to hand surgery suture removal as per them-- has appt. Dr. Burney Gauze later this week PVD with BKA Continue Plavix 75 Systolic/diastolic HF EF 45 to 21% with chronic RCA occlusion placed on telemetry 2021 based on cardiac cath 10/2019  AoS status post TVAR 2020 Continue amlodipine 10, atorvastatin 80, Plavix as above ESRD HD MWF [facility] Defer to renal NSVT/A. fib? [Tells me has 2 Rx for the amio--never on AC?] Keep on monitors today, continue amiodarone 200 daily Recheck magnesium in a.m. along with regular labs   DVT prophylaxis: Heparin Code Status: Full Family Communication: Patient alone  Disposition:  Status is: Inpatient  Remains inpatient appropriate because:Inpatient level of care appropriate due to severity of  illness  Dispo: The patient is from: Home              Anticipated d/c is to: SNF when able               Patient currently is medically stable to d/c.   Difficult to place patient No       Consultants:  renal  Procedures: n  Antimicrobials: n    Subjective: Well, nad no focal deficit Coherent  Eating some Pain controlled No nv  Objective: Vitals:   11/05/20 2330 11/06/20 0100 11/06/20 0335 11/06/20 0735  BP: (!) 89/33 (!) 166/70 (!) 172/65 (!) 167/68  Pulse: 71 68 72 68  Resp: 18  16   Temp: 98.6 F (37 C)  98 F (36.7 C) 98.3 F (36.8 C)  TempSrc: Oral  Oral Oral  SpO2: 98%  98% 94%  Weight:       No intake or output data in the 24 hours ending 11/06/20 0743 Filed Weights   11/04/20 1424 11/04/20 1741  Weight: 76.9 kg 73.8 kg    Examination:  Eomi pleasant coherent a0x 4 S1 s2 in NSR on exam and monitors Cta b no added sound LUE bandaged with re-inforced dressing No le edema Psych euthym, pleasant  Data Reviewed: personally reviewed   CBC    Component Value Date/Time   WBC 9.4 11/04/2020 0130   RBC 2.98 (L) 11/04/2020 0130   HGB 9.2 (L) 11/04/2020 0143   HGB 11.4 (L) 11/12/2018 1637   HGB 10.8 (L) 03/14/2017 0921   HCT 27.0 (L) 11/04/2020 0143   HCT 35.0 (L) 11/12/2018 1637   HCT 32.5 (L) 03/14/2017 0921   PLT 129 (L) 11/04/2020 0130   PLT 51 (LL) 11/12/2018 1637   MCV 97.3 11/04/2020 0130  MCV 103 (H) 11/12/2018 1637   MCV 106.2 (H) 03/14/2017 0921   MCH 31.2 11/04/2020 0130   MCHC 32.1 11/04/2020 0130   RDW 16.1 (H) 11/04/2020 0130   RDW 14.6 11/12/2018 1637   RDW 14.6 03/14/2017 0921   LYMPHSABS 1.4 11/04/2020 0130   LYMPHSABS 1.0 11/12/2018 1637   LYMPHSABS 0.8 (L) 03/14/2017 0921   MONOABS 0.4 11/04/2020 0130   MONOABS 0.6 03/14/2017 0921   EOSABS 0.0 11/04/2020 0130   EOSABS 0.2 11/12/2018 1637   BASOSABS 0.0 11/04/2020 0130   BASOSABS 0.0 11/12/2018 1637   BASOSABS 0.0 03/14/2017 0921   CMP Latest Ref Rng & Units  11/04/2020 11/04/2020 11/03/2020  Glucose 70 - 99 mg/dL - 102(H) 106(H)  BUN 8 - 23 mg/dL - 42(H) 40(H)  Creatinine 0.61 - 1.24 mg/dL - 9.36(H) 9.05(H)  Sodium 135 - 145 mmol/L 137 138 137  Potassium 3.5 - 5.1 mmol/L 3.5 3.5 3.7  Chloride 98 - 111 mmol/L - 94(L) 93(L)  CO2 22 - 32 mmol/L - 25 27  Calcium 8.9 - 10.3 mg/dL - 9.5 9.6  Total Protein 6.5 - 8.1 g/dL - 6.8 6.6  Total Bilirubin 0.3 - 1.2 mg/dL - 0.7 0.7  Alkaline Phos 38 - 126 U/L - 50 51  AST 15 - 41 U/L - 18 19  ALT 0 - 44 U/L - 6 7     Radiology Studies: No results found.   Scheduled Meds:  amiodarone  200 mg Oral Daily   amLODipine  10 mg Oral Daily   atorvastatin  80 mg Oral Q supper   Chlorhexidine Gluconate Cloth  6 each Topical Q0600   clopidogrel  75 mg Oral Daily   heparin  5,000 Units Subcutaneous Q8H   lanthanum  2,000 mg Oral TID WC   pantoprazole  40 mg Oral Daily   Continuous Infusions:   LOS: 1 day   Time spent: [place], MD [facility] To contact the attending provider between 7A-7P or the covering provider during after hours 7P-7A, please log into the web site [email or web address] and access using universal [place] password for that web site. If you do not have the password, please call the hospital operator.  11/06/2020, 7:43 AM

## 2020-11-06 NOTE — Progress Notes (Addendum)
[facility] Progress Note   Subjective:   Patient seen and examined at bedside.  Closer to baseline today, recognizes me from outpatient dialysis and carries on normal conversation without reference to people or objects not present.  Denies CP, SOB, n/v/d and abdominal pain.  Admits to being tired and LE twitching.  Lab unable to get blood x2 and now refusing to be stuck again.    Objective Vitals:   11/05/20 2330 11/06/20 0100 11/06/20 0335 11/06/20 0735  BP: (!) 89/33 (!) 166/70 (!) 172/65 (!) 167/68  Pulse: 71 68 72 68  Resp: 18  16   Temp: 98.6 F (37 C)  98 F (36.7 C) 98.3 F (36.8 C)  TempSrc: Oral  Oral Oral  SpO2: 98%  98% 94%  Weight:       Physical Exam General:chronically ill appearing male in NAD Heart:RRR, no mrg Lungs:CTAB, nml WOB on RA Abdomen:soft, NTND Extremities:R BKA, no edema b/l Dialysis Access: RU AVF +b/t   Filed Weights   11/04/20 1424 11/04/20 1741  Weight: 76.9 kg 73.8 kg   No intake or output data in the 24 hours ending 11/06/20 1009  Additional Objective Labs: Basic Metabolic Panel: Recent Labs  Lab 11/02/20 1244 11/03/20 2144 11/04/20 0130 11/04/20 0143  NA 137 137 138 137  K 3.2* 3.7 3.5 3.5  CL 94* 93* 94*  --   CO2  --  27 25  --   GLUCOSE 144* 106* 102*  --   BUN 23 40* 42*  --   CREATININE 6.50* 9.05* 9.36*  --   CALCIUM  --  9.6 9.5  --   PHOS  --   --  8.4*  --    Liver Function Tests: Recent Labs  Lab 11/03/20 2144 11/04/20 0130  AST 19 18  ALT 7 6  ALKPHOS 51 50  BILITOT 0.7 0.7  PROT 6.6 6.8  ALBUMIN 3.7 3.5   CBC: Recent Labs  Lab 11/03/20 2144 11/04/20 0130 11/04/20 0143  WBC 7.9 9.4  --   NEUTROABS 6.3 7.5  --   HGB 9.3* 9.3* 9.2*  HCT 28.8* 29.0* 27.0*  MCV 96.6 97.3  --   PLT 133* 129*  --    Blood Culture    Component Value Date/Time   SDES WOUND LEFT WRIST 11/02/2020 1424   SPECREQUEST MASS 11/02/2020 1424   CULT  11/02/2020 1424    RARE NORMAL SKIN FLORA NO ANAEROBES  ISOLATED; CULTURE IN PROGRESS FOR 5 DAYS    REPTSTATUS PENDING 11/02/2020 1424    Lab Results  Component Value Date   INR 1.2 04/29/2019   INR 1.3 (H) 10/16/2018   INR 1.2 07/22/2018   PROTIME 13.2 01/21/2015   Studies/Results: No results found.  Medications:   amiodarone  200 mg Oral Daily   amLODipine  10 mg Oral Daily   atorvastatin  80 mg Oral Q supper   Chlorhexidine Gluconate Cloth  6 each Topical Q0600   clopidogrel  75 mg Oral Daily   heparin  5,000 Units Subcutaneous Q8H   lanthanum  2,000 mg Oral TID WC   pantoprazole  40 mg Oral Daily    Dialysis Orders: [facility] MWF 4hrs 400/AF 1.5 2K 2Ca Profile 4 EDW 76.5kg RU AVF Hep 2400 Venofer 147mcg x5HD Mircera 83mcg on 8/17 Calcitriol 17mcg qHD   Assessment/Plan:  Confusion/hallucinations - CT negative.  Etiology unclear.  Improved today. Debility - plan for SNF placement  ESRD -  on HD MWF.  Plan for HD 8/29 per regular schedule.  Lab pre HD if not completed today.   Hypertension/volume  - BP variable. Continue home meds.  Does not appear volume overloaded.  Net UF 3L last HD. Continue UF as tolerated.   Anemia of CKD - Last Hgb 9.2. ESA due 8/31.   Secondary Hyperparathyroidism -  Ca in goal.  Phos elevated, continue binders and VDRA.  Nutrition - Renal diet w/fluid restrictions CAD/CHF/severe AS s/p AVR PVD s/p R BKA  Jen Mow, PA-C [place] 11/06/2020,10:09 AM  LOS: 1 day   Pt seen, examined and agree w A/P as above.  [name]  MD 11/06/2020, 6:25 PM

## 2020-11-07 LAB — COMPREHENSIVE METABOLIC PANEL
ALT: <5 U/L (ref 0–44)
AST: 21 U/L (ref 15–41)
Albumin: 3 g/dL — ABNORMAL LOW (ref 3.5–5.0)
Alkaline Phosphatase: 47 U/L (ref 38–126)
Anion gap: 12 (ref 5–15)
BUN: 52 mg/dL — ABNORMAL HIGH (ref 8–23)
CO2: 26 mmol/L (ref 22–32)
Calcium: 9 mg/dL (ref 8.9–10.3)
Chloride: 98 mmol/L (ref 98–111)
Creatinine, Ser: 10.6 mg/dL — ABNORMAL HIGH (ref 0.61–1.24)
GFR, Estimated: 5 mL/min — ABNORMAL LOW (ref >60)
Glucose, Bld: 102 mg/dL — ABNORMAL HIGH (ref 70–99)
Potassium: 4.3 mmol/L (ref 3.5–5.1)
Sodium: 136 mmol/L (ref 135–145)
Total Bilirubin: 0.9 mg/dL (ref 0.3–1.2)
Total Protein: 5.9 g/dL — ABNORMAL LOW (ref 6.5–8.1)

## 2020-11-07 LAB — AEROBIC/ANAEROBIC CULTURE W GRAM STAIN (SURGICAL/DEEP WOUND)
Culture: NORMAL
Gram Stain: NONE SEEN

## 2020-11-07 LAB — MAGNESIUM: Magnesium: 2.2 mg/dL (ref 1.7–2.4)

## 2020-11-07 NOTE — TOC Progression Note (Signed)
Transition of Care [facility]) - Progression Note    Patient Details  Name: Jose Garrett MRN: [id number] Date of Birth: [birth year]  Transition of Care [facility]) CM/SW [place], [place] Phone Number: 11/07/2020, 11:43 AM  Clinical Narrative:   CSW met with patient to provide SNF bed offers, and patient requested that CSW contacted his daughter to review. CSW explained SNF offers that had transport available for HD and which did not, and patient was appreciative of information. CSW called patient's daughter Jose Garrett and updated her on offers. CSW emailed the list to daughter for her to review. Daughter said she had done some research already and was interested in [facility], which is still pending. CSW asked [place] to review referral. CSW to continue to follow.    Expected Discharge Plan: Skilled Nursing Facility Barriers to Discharge: SNF Pending bed offer, Continued Medical Work up  Expected Discharge Plan and Services Expected Discharge Plan: [place] Choice: [place] arrangements for the past 2 months: Single Family Home                                       Social Determinants of Health (SDOH) Interventions    Readmission Risk Interventions Readmission Risk Prevention Plan 04/15/2019 10/23/2018 08/20/2018  Transportation Screening Complete Complete -  PCP or Specialist Appt within 3-5 Days - Complete -  HRI or [place] - Complete -  Social Work Consult for [place] Planning/Counseling - Complete -  Palliative Care Screening - Not Applicable -  Medication Review Press photographer) Complete Complete -  PCP or Specialist appointment within 3-5 days of discharge Complete - Complete  HRI or Home Care Consult Complete - Complete  SW Recovery Care/Counseling Consult Complete - Complete  Palliative Care Screening Not Applicable - Not [place] Patient Refused - Not  Applicable  Some recent data might be hidden

## 2020-11-07 NOTE — Progress Notes (Signed)
Patient off unit for treatment 

## 2020-11-07 NOTE — Progress Notes (Signed)
Attempted to f/u on choice for SNF with daughter, [name] but unable to reach her and left voicemail.

## 2020-11-07 NOTE — Progress Notes (Signed)
Patient back from treatment In room 3w-30

## 2020-11-07 NOTE — Progress Notes (Signed)
PROGRESS NOTE    Jose Garrett  IWP:[id number] DOB: 04-Apr-1950 DOA: 11/03/2020 PCP: [name], NP    Brief Narrative:  ESRD hemodialysis Monday Wednesday Friday, chronic combined heart failure, severe aortic stenosis status post TAVR, hypertension, hyperlipidemia, GERD, right BKA, excisional biopsy of the left forearm lesion 8/24 brought to the ER with worsening disorientation, lethargy and hallucinations.  Lives at home with daughter.  Work-up so far negative.  Continue to exhibit confusion and disorientation so admitted. Patient received general anesthesia on 8/24 for cyst excision.  Probably encephalopathy due to medications, opiates and benzodiazepines.  Hemodynamically stable.  Neurologically stable.    Assessment & Plan:   Principal Problem:   Acute metabolic encephalopathy Active Problems:   Essential hypertension, benign   ESRD (end stage renal disease) on dialysis (HCC)   Coronary artery disease involving native coronary artery of native heart without angina pectoris   GERD without esophagitis   Chronic combined systolic and diastolic CHF (congestive heart failure) (HCC)   Mixed hyperlipidemia   Altered mental status  Acute metabolic encephalopathy: Probably due to general anesthesia in a patient with hemodialysis.  Fully alert and oriented.  Continue low-dose of benzodiazepine.    ESRD on hemodialysis: Continue dialysis per schedule by nephrology  Extreme debility, right BKA status and now with left hand surgery: PT OT on board they recommended SNF for rehab.  TOC working on placement  Essential hypertension: Blood pressure stable.  Continue amlodipine.  GERD without esophagitis: Continue Protonix.  Chronic combined heart failure: Currently euvolemic.  Managed with dialysis.  Hyperlipidemia: Continue statin   DVT prophylaxis: heparin injection 5,000 Units Start: 11/04/20 1400   Code Status: Full code Family Communication: Wife at the bedside. Disposition Plan:  SNF.  Status is: Inpatient  Remains inpatient appropriate because:Unsafe d/c plan  Dispo: The patient is from: Home              Anticipated d/c is to: SNF              Patient currently is medically stable to d/c.   Difficult to place patient No        Consultants:  Nephrology  Procedures:  Routine hemodialysis 8/26  Antimicrobials:  None   Subjective: Patient seen and examined.  Wife at the bedside.  He has no complaints.  He is fully alert and oriented.  Objective: Vitals:   11/07/20 0000 11/07/20 0400 11/07/20 0820 11/07/20 1200  BP: (!) 158/59 (!) 169/53 (!) 126/48 125/62  Pulse: 69 68 66 71  Resp: 18 18 19 20   Temp: 98 F (36.7 C) 97.9 F (36.6 C) 97.8 F (36.6 C) 98.3 F (36.8 C)  TempSrc: Oral Oral Oral Oral  SpO2: 93% 90% 90% 95%  Weight:       No intake or output data in the 24 hours ending 11/07/20 1459  Filed Weights   11/04/20 1424 11/04/20 1741  Weight: 76.9 kg 73.8 kg    Examination:  General exam: Appears calm and comfortable  Respiratory system: Clear to auscultation. Respiratory effort normal. Cardiovascular system: S1 & S2 heard, RRR. No JVD, murmurs, rubs, gallops or clicks. No pedal edema. Gastrointestinal system: Abdomen is nondistended, soft and nontender. No organomegaly or masses felt. Normal bowel sounds heard. Central nervous system: Alert and oriented. No focal neurological deficits. Extremities: Symmetric 5 x 5 power. Skin: No rashes, lesions or ulcers.  Psychiatry: Judgement and insight appear normal. Mood & affect appropriate.    Data Reviewed: I have personally reviewed  following labs and imaging studies  CBC: Recent Labs  Lab 11/02/20 1244 11/03/20 2144 11/04/20 0130 11/04/20 0143 11/06/20 1119  WBC  --  7.9 9.4  --  6.4  NEUTROABS  --  6.3 7.5  --  4.9  HGB 8.5* 9.3* 9.3* 9.2* 8.3*  HCT 25.0* 28.8* 29.0* 27.0* 26.0*  MCV  --  96.6 97.3  --  95.6  PLT  --  133* 129*  --  123*    Basic Metabolic  Panel: Recent Labs  Lab 11/02/20 1244 11/03/20 2144 11/04/20 0130 11/04/20 0143 11/06/20 1119 11/07/20 0116  NA 137 137 138 137 136 136  K 3.2* 3.7 3.5 3.5 3.9 4.3  CL 94* 93* 94*  --  97* 98  CO2  --  27 25  --  26 26  GLUCOSE 144* 106* 102*  --  109* 102*  BUN 23 40* 42*  --  45* 52*  CREATININE 6.50* 9.05* 9.36*  --  9.53* 10.60*  CALCIUM  --  9.6 9.5  --  9.3 9.0  MG  --   --  2.1  --   --  2.2  PHOS  --   --  8.4*  --  6.5*  --     GFR: Estimated Creatinine Clearance: 6.9 mL/min (A) (by C-G formula based on SCr of 10.6 mg/dL (H)). Liver Function Tests: Recent Labs  Lab 11/03/20 2144 11/04/20 0130 11/06/20 1119 11/07/20 0116  AST 19 18  --  21  ALT 7 6  --  <5  ALKPHOS 51 50  --  47  BILITOT 0.7 0.7  --  0.9  PROT 6.6 6.8  --  5.9*  ALBUMIN 3.7 3.5 3.0* 3.0*    No results for input(s): LIPASE, AMYLASE in the last 168 hours. Recent Labs  Lab 11/04/20 0130  AMMONIA 14    Coagulation Profile: No results for input(s): INR, PROTIME in the last 168 hours. Cardiac Enzymes: No results for input(s): CKTOTAL, CKMB, CKMBINDEX, TROPONINI in the last 168 hours. BNP (last 3 results) No results for input(s): PROBNP in the last 8760 hours. HbA1C: No results for input(s): HGBA1C in the last 72 hours. CBG: No results for input(s): GLUCAP in the last 168 hours. Lipid Profile: No results for input(s): CHOL, HDL, LDLCALC, TRIG, CHOLHDL, LDLDIRECT in the last 72 hours. Thyroid Function Tests: No results for input(s): TSH, T4TOTAL, FREET4, T3FREE, THYROIDAB in the last 72 hours.  Anemia Panel: No results for input(s): VITAMINB12, FOLATE, FERRITIN, TIBC, IRON, RETICCTPCT in the last 72 hours.  Sepsis Labs: Recent Labs  Lab 11/04/20 1430  PROCALCITON 0.20     Recent Results (from the past 240 hour(s))  Aerobic/Anaerobic Culture w Gram Stain (surgical/deep wound)     Status: None   Collection Time: 11/02/20  2:24 PM   Specimen: Soft Tissue, Other  Result Value Ref  Range Status   Specimen Description WOUND LEFT WRIST  Final   Special Requests MASS  Final   Gram Stain   Final    NO SQUAMOUS EPITHELIAL CELLS SEEN NO WBC SEEN FEW GRAM NEGATIVE RODS    Culture   Final    RARE NORMAL SKIN FLORA NO ANAEROBES ISOLATED Performed at [facility], [place] [address], [place], [place] [address]    Report Status 11/07/2020 FINAL  Final  Resp Panel by RT-PCR (Flu A&B, Covid) Nasopharyngeal Swab     Status: None   Collection Time: 11/04/20 12:07 AM   Specimen: Nasopharyngeal Swab;  Nasopharyngeal(NP) swabs in vial transport medium  Result Value Ref Range Status   SARS Coronavirus 2 by RT PCR NEGATIVE NEGATIVE Final    Comment: (NOTE) SARS-CoV-2 target nucleic acids are NOT DETECTED.  The SARS-CoV-2 RNA is generally detectable in upper respiratory specimens during the acute phase of infection. The lowest concentration of SARS-CoV-2 viral copies this assay can detect is 138 copies/mL. A negative result does not preclude SARS-Cov-2 infection and should not be used as the sole basis for treatment or other patient management decisions. A negative result may occur with  improper specimen collection/handling, submission of specimen other than nasopharyngeal swab, presence of viral mutation(s) within the areas targeted by this assay, and inadequate number of viral copies(<138 copies/mL). A negative result must be combined with clinical observations, patient history, and epidemiological information. The expected result is Negative.  Fact Sheet for Patients:  EntrepreneurPulse.com.au  Fact Sheet for Healthcare Providers:  IncredibleEmployment.be  This test is no t yet approved or cleared by the [place] FDA and  has been authorized for detection and/or diagnosis of SARS-CoV-2 by FDA under an Emergency Use Authorization (EUA). This EUA will remain  in effect (meaning this test can be used) for the duration of  the COVID-19 declaration under Section 564(b)(1) of the Act, 21 U.S.C.section 360bbb-3(b)(1), unless the authorization is terminated  or revoked sooner.       Influenza A by PCR NEGATIVE NEGATIVE Final   Influenza B by PCR NEGATIVE NEGATIVE Final    Comment: (NOTE) The Xpert Xpress SARS-CoV-2/FLU/RSV plus assay is intended as an aid in the diagnosis of influenza from Nasopharyngeal swab specimens and should not be used as a sole basis for treatment. Nasal washings and aspirates are unacceptable for Xpert Xpress SARS-CoV-2/FLU/RSV testing.  Fact Sheet for Patients: EntrepreneurPulse.com.au  Fact Sheet for Healthcare Providers: IncredibleEmployment.be  This test is not yet approved or cleared by the [place] FDA and has been authorized for detection and/or diagnosis of SARS-CoV-2 by FDA under an Emergency Use Authorization (EUA). This EUA will remain in effect (meaning this test can be used) for the duration of the COVID-19 declaration under Section 564(b)(1) of the Act, 21 U.S.C. section 360bbb-3(b)(1), unless the authorization is terminated or revoked.  Performed at [facility], [place] [address], [place], [place] [address]           Radiology Studies: No results found.      Scheduled Meds:  amiodarone  200 mg Oral Daily   amLODipine  10 mg Oral Daily   atorvastatin  80 mg Oral Q supper   Chlorhexidine Gluconate Cloth  6 each Topical Q0600   clopidogrel  75 mg Oral Daily   heparin  5,000 Units Subcutaneous Q8H   lanthanum  2,000 mg Oral TID WC   pantoprazole  40 mg Oral Daily   Continuous Infusions:   LOS: 2 days    Time spent: 230 minutes  Darliss Cheney, MD [facility]

## 2020-11-07 NOTE — Progress Notes (Signed)
[facility] ROUNDING NOTE   Subjective:   Interval History: 70 year old end-stage renal disease Monday Wednesday Friday dialysis.  Admitted with altered mental status increased lethargy and hallucinations.  Confusion thought to be secondary to metabolic encephalopathy and hallucinations secondary to opiates and anesthesia use.  Patient has undergone a recent left dorsal/palmar wrist mass excision 11/02/2020.  He was using OxyContin for pain control.  The patient's last dialysis treatment was 11/04/2020 with 3 L removed.  Blood pressure 169/53 pulse 68 temperature 97.9 O2 sats 90% room air  Sodium 136 potassium 4.3 chloride 98 CO2 26 BUN 52 creatinine 10 glucose 102 calcium 9 albumin 3  Plan dialysis 11/08/2020  Objective:  Vital signs in last 24 hours:  Temp:  [97.9 F (36.6 C)-99.1 F (37.3 C)] 97.9 F (36.6 C) (08/29 0400) Pulse Rate:  [68-74] 68 (08/29 0400) Resp:  [18] 18 (08/29 0400) BP: (147-169)/(53-68) 169/53 (08/29 0400) SpO2:  [87 %-94 %] 90 % (08/29 0400)  Weight change:  Filed Weights   11/04/20 1424 11/04/20 1741  Weight: 76.9 kg 73.8 kg    Intake/Output: No intake/output data recorded.   Intake/Output this shift:  No intake/output data recorded.  General:chronically ill appearing male in NAD Heart:RRR, no mrg Lungs:CTAB, nml WOB on RA Abdomen:soft, NTND Extremities:R BKA, no edema b/l Dialysis Access: RU AVF +b/t    Basic Metabolic Panel: Recent Labs  Lab 11/02/20 1244 11/02/20 1244 11/03/20 2144 11/04/20 0130 11/04/20 0143 11/06/20 1119 11/07/20 0116  NA 137  --  137 138 137 136 136  K 3.2*  --  3.7 3.5 3.5 3.9 4.3  CL 94*  --  93* 94*  --  97* 98  CO2  --   --  27 25  --  26 26  GLUCOSE 144*  --  106* 102*  --  109* 102*  BUN 23  --  40* 42*  --  45* 52*  CREATININE 6.50*  --  9.05* 9.36*  --  9.53* 10.60*  CALCIUM  --    < > 9.6 9.5  --  9.3 9.0  MG  --   --   --  2.1  --   --  2.2  PHOS  --   --   --  8.4*  --  6.5*  --     < > = values in this interval not displayed.    Liver Function Tests: Recent Labs  Lab 11/03/20 2144 11/04/20 0130 11/06/20 1119 11/07/20 0116  AST 19 18  --  21  ALT 7 6  --  <5  ALKPHOS 51 50  --  47  BILITOT 0.7 0.7  --  0.9  PROT 6.6 6.8  --  5.9*  ALBUMIN 3.7 3.5 3.0* 3.0*   No results for input(s): LIPASE, AMYLASE in the last 168 hours. Recent Labs  Lab 11/04/20 0130  AMMONIA 14    CBC: Recent Labs  Lab 11/02/20 1244 11/03/20 2144 11/04/20 0130 11/04/20 0143 11/06/20 1119  WBC  --  7.9 9.4  --  6.4  NEUTROABS  --  6.3 7.5  --  4.9  HGB 8.5* 9.3* 9.3* 9.2* 8.3*  HCT 25.0* 28.8* 29.0* 27.0* 26.0*  MCV  --  96.6 97.3  --  95.6  PLT  --  133* 129*  --  123*    Cardiac Enzymes: No results for input(s): CKTOTAL, CKMB, CKMBINDEX, TROPONINI in the last 168 hours.  BNP: Invalid input(s): POCBNP  CBG: No results for input(s):  GLUCAP in the last 168 hours.  Microbiology: Results for orders placed or performed during the hospital encounter of 11/03/20  Resp Panel by RT-PCR (Flu A&B, Covid) Nasopharyngeal Swab     Status: None   Collection Time: 11/04/20 12:07 AM   Specimen: Nasopharyngeal Swab; Nasopharyngeal(NP) swabs in vial transport medium  Result Value Ref Range Status   SARS Coronavirus 2 by RT PCR NEGATIVE NEGATIVE Final    Comment: (NOTE) SARS-CoV-2 target nucleic acids are NOT DETECTED.  The SARS-CoV-2 RNA is generally detectable in upper respiratory specimens during the acute phase of infection. The lowest concentration of SARS-CoV-2 viral copies this assay can detect is 138 copies/mL. A negative result does not preclude SARS-Cov-2 infection and should not be used as the sole basis for treatment or other patient management decisions. A negative result may occur with  improper specimen collection/handling, submission of specimen other than nasopharyngeal swab, presence of viral mutation(s) within the areas targeted by this assay, and inadequate  number of viral copies(<138 copies/mL). A negative result must be combined with clinical observations, patient history, and epidemiological information. The expected result is Negative.  Fact Sheet for Patients:  EntrepreneurPulse.com.au  Fact Sheet for Healthcare Providers:  IncredibleEmployment.be  This test is no t yet approved or cleared by the [place] FDA and  has been authorized for detection and/or diagnosis of SARS-CoV-2 by FDA under an Emergency Use Authorization (EUA). This EUA will remain  in effect (meaning this test can be used) for the duration of the COVID-19 declaration under Section 564(b)(1) of the Act, 21 U.S.C.section 360bbb-3(b)(1), unless the authorization is terminated  or revoked sooner.       Influenza A by PCR NEGATIVE NEGATIVE Final   Influenza B by PCR NEGATIVE NEGATIVE Final    Comment: (NOTE) The Xpert Xpress SARS-CoV-2/FLU/RSV plus assay is intended as an aid in the diagnosis of influenza from Nasopharyngeal swab specimens and should not be used as a sole basis for treatment. Nasal washings and aspirates are unacceptable for Xpert Xpress SARS-CoV-2/FLU/RSV testing.  Fact Sheet for Patients: EntrepreneurPulse.com.au  Fact Sheet for Healthcare Providers: IncredibleEmployment.be  This test is not yet approved or cleared by the [place] FDA and has been authorized for detection and/or diagnosis of SARS-CoV-2 by FDA under an Emergency Use Authorization (EUA). This EUA will remain in effect (meaning this test can be used) for the duration of the COVID-19 declaration under Section 564(b)(1) of the Act, 21 U.S.C. section 360bbb-3(b)(1), unless the authorization is terminated or revoked.  Performed at [facility], [place] [address], [place], [place] [address]    *Note: Due to a large number of results and/or encounters for the requested time period, some results  have not been displayed. A complete set of results can be found in Results Review.    Coagulation Studies: No results for input(s): LABPROT, INR in the last 72 hours.  Urinalysis: No results for input(s): COLORURINE, LABSPEC, PHURINE, GLUCOSEU, HGBUR, BILIRUBINUR, KETONESUR, PROTEINUR, UROBILINOGEN, NITRITE, LEUKOCYTESUR in the last 72 hours.  Invalid input(s): APPERANCEUR    Imaging: No results found.   Medications:     amiodarone  200 mg Oral Daily   amLODipine  10 mg Oral Daily   atorvastatin  80 mg Oral Q supper   Chlorhexidine Gluconate Cloth  6 each Topical Q0600   clopidogrel  75 mg Oral Daily   heparin  5,000 Units Subcutaneous Q8H   lanthanum  2,000 mg Oral TID WC   pantoprazole  40 mg  Oral Daily   acetaminophen **OR** acetaminophen, hydrALAZINE, LORazepam, ondansetron **OR** ondansetron (ZOFRAN) IV, polyethylene glycol  Assessment/ Plan:   Dialysis Orders: [facility] MWF 4hrs 400/AF 1.5 2K 2Ca Profile 4 EDW 76.5kg RU AVF Hep 2400 Venofer 146mcg x5HD Mircera 29mcg on 8/17 Calcitriol 75mcg qHD  Assessment/Plan:  Confusion/hallucinations - CT negative.  Etiology unclear.  Improved today. Debility - plan for SNF placement  ESRD -  on HD MWF.  Plan for HD 11/08/2020 per regular schedule.    BP variable. Continue home meds.  Does not appear volume overloaded.  Net UF 3L last HD. Continue UF as tolerated.   Anemia of CKD - Last Hgb 9.2. ESA due 8/31.   Secondary Hyperparathyroidism -  Ca in goal.  Phos elevated, continue binders and VDRA.  Nutrition - Renal diet w/fluid restrictions CAD/CHF/severe AS s/p AVR PVD s/p R BKA   LOS: 2 Sherril Croon @TODAY @7 :27 AM

## 2020-11-08 LAB — GLUCOSE, CAPILLARY: Glucose-Capillary: 112 mg/dL — ABNORMAL HIGH (ref 70–99)

## 2020-11-08 NOTE — Progress Notes (Signed)
PROGRESS NOTE    Jose Garrett  KXF:[id number] DOB: 10/20/1950 DOA: 11/03/2020 PCP: [name], NP    Brief Narrative:  ESRD hemodialysis Monday Wednesday Friday, chronic combined heart failure, severe aortic stenosis status post TAVR, hypertension, hyperlipidemia, GERD, right BKA, excisional biopsy of the left forearm lesion 8/24 brought to the ER with worsening disorientation, lethargy and hallucinations.  Lives at home with daughter.  Work-up so far negative.  Continue to exhibit confusion and disorientation so admitted. Patient received general anesthesia on 8/24 for cyst excision.  Probably encephalopathy due to medications, opiates and benzodiazepines.  Hemodynamically stable.  Neurologically stable.    Assessment & Plan:   Principal Problem:   Acute metabolic encephalopathy Active Problems:   Essential hypertension, benign   ESRD (end stage renal disease) on dialysis (HCC)   Coronary artery disease involving native coronary artery of native heart without angina pectoris   GERD without esophagitis   Chronic combined systolic and diastolic CHF (congestive heart failure) (HCC)   Mixed hyperlipidemia   Altered mental status  Acute metabolic encephalopathy: Probably due to general anesthesia in a patient with hemodialysis.  Fully alert and oriented.  Continue low-dose of benzodiazepine.    ESRD on hemodialysis: Continue dialysis per schedule by nephrology  Extreme debility, right BKA status and now with left hand surgery: PT OT on board they recommended SNF for rehab.  TOC working on placement  Essential hypertension: Blood pressure stable.  Continue amlodipine.  GERD without esophagitis: Continue Protonix.  Chronic combined heart failure: Currently euvolemic.  Managed with dialysis.  Hyperlipidemia: Continue statin Patient is medically stable for discharge.  Per TOC, they are having hard time getting a hold of the family member so that they can choose a facility.  DVT  prophylaxis: heparin injection 5,000 Units Start: 11/04/20 1400   Code Status: Full code Family Communication: Wife at the bedside. Disposition Plan: SNF.  Status is: Inpatient  Remains inpatient appropriate because:Unsafe d/c plan  Dispo: The patient is from: Home              Anticipated d/c is to: SNF              Patient currently is medically stable to d/c.   Difficult to place patient No        Consultants:  Nephrology  Procedures:  Routine hemodialysis 8/26  Antimicrobials:  None   Subjective: Patient seen and examined.  He has no complaints.  No family member at the bedside today.  Objective: Vitals:   11/07/20 1930 11/08/20 0000 11/08/20 0400 11/08/20 0800  BP: 108/65 118/70 132/72 (!) 128/50  Pulse: 73 70 68 66  Resp: 18 18 18 18   Temp: 98.8 F (37.1 C) 98.6 F (37 C) 98.4 F (36.9 C) 98.7 F (37.1 C)  TempSrc: Oral Oral Oral Oral  SpO2: 95% 96% 94% 94%  Weight:        Intake/Output Summary (Last 24 hours) at 11/08/2020 1239 Last data filed at 11/07/2020 1712 Gross per 24 hour  Intake --  Output 3000 ml  Net -3000 ml    Filed Weights   11/04/20 1741 11/07/20 1402 11/07/20 1731  Weight: 73.8 kg 73.3 kg 70.3 kg    Examination: General exam: Appears calm and comfortable  Respiratory system: Clear to auscultation. Respiratory effort normal. Cardiovascular system: S1 & S2 heard, RRR. No JVD, murmurs, rubs, gallops or clicks. No pedal edema. Gastrointestinal system: Abdomen is nondistended, soft and nontender. No organomegaly or masses felt. Normal bowel  sounds heard. Central nervous system: Alert and oriented. No focal neurological deficits. Extremities: Right BKA Skin: No rashes, lesions or ulcers.  Psychiatry: Judgement and insight appear normal. Mood & affect appropriate.   Data Reviewed: I have personally reviewed following labs and imaging studies  CBC: Recent Labs  Lab 11/02/20 1244 11/03/20 2144 11/04/20 0130 11/04/20 0143  11/06/20 1119  WBC  --  7.9 9.4  --  6.4  NEUTROABS  --  6.3 7.5  --  4.9  HGB 8.5* 9.3* 9.3* 9.2* 8.3*  HCT 25.0* 28.8* 29.0* 27.0* 26.0*  MCV  --  96.6 97.3  --  95.6  PLT  --  133* 129*  --  123*    Basic Metabolic Panel: Recent Labs  Lab 11/02/20 1244 11/03/20 2144 11/04/20 0130 11/04/20 0143 11/06/20 1119 11/07/20 0116  NA 137 137 138 137 136 136  K 3.2* 3.7 3.5 3.5 3.9 4.3  CL 94* 93* 94*  --  97* 98  CO2  --  27 25  --  26 26  GLUCOSE 144* 106* 102*  --  109* 102*  BUN 23 40* 42*  --  45* 52*  CREATININE 6.50* 9.05* 9.36*  --  9.53* 10.60*  CALCIUM  --  9.6 9.5  --  9.3 9.0  MG  --   --  2.1  --   --  2.2  PHOS  --   --  8.4*  --  6.5*  --     GFR: Estimated Creatinine Clearance: 6.6 mL/min (A) (by C-G formula based on SCr of 10.6 mg/dL (H)). Liver Function Tests: Recent Labs  Lab 11/03/20 2144 11/04/20 0130 11/06/20 1119 11/07/20 0116  AST 19 18  --  21  ALT 7 6  --  <5  ALKPHOS 51 50  --  47  BILITOT 0.7 0.7  --  0.9  PROT 6.6 6.8  --  5.9*  ALBUMIN 3.7 3.5 3.0* 3.0*    No results for input(s): LIPASE, AMYLASE in the last 168 hours. Recent Labs  Lab 11/04/20 0130  AMMONIA 14    Coagulation Profile: No results for input(s): INR, PROTIME in the last 168 hours. Cardiac Enzymes: No results for input(s): CKTOTAL, CKMB, CKMBINDEX, TROPONINI in the last 168 hours. BNP (last 3 results) No results for input(s): PROBNP in the last 8760 hours. HbA1C: No results for input(s): HGBA1C in the last 72 hours. CBG: Recent Labs  Lab 11/08/20 0843  GLUCAP 112*   Lipid Profile: No results for input(s): CHOL, HDL, LDLCALC, TRIG, CHOLHDL, LDLDIRECT in the last 72 hours. Thyroid Function Tests: No results for input(s): TSH, T4TOTAL, FREET4, T3FREE, THYROIDAB in the last 72 hours.  Anemia Panel: No results for input(s): VITAMINB12, FOLATE, FERRITIN, TIBC, IRON, RETICCTPCT in the last 72 hours.  Sepsis Labs: Recent Labs  Lab 11/04/20 1430  PROCALCITON  0.20     Recent Results (from the past 240 hour(s))  Aerobic/Anaerobic Culture w Gram Stain (surgical/deep wound)     Status: None   Collection Time: 11/02/20  2:24 PM   Specimen: Soft Tissue, Other  Result Value Ref Range Status   Specimen Description WOUND LEFT WRIST  Final   Special Requests MASS  Final   Gram Stain   Final    NO SQUAMOUS EPITHELIAL CELLS SEEN NO WBC SEEN FEW GRAM NEGATIVE RODS    Culture   Final    RARE NORMAL SKIN FLORA NO ANAEROBES ISOLATED Performed at [facility], [place]  [address], [place], [place] [address]    Report Status 11/07/2020 FINAL  Final  Resp Panel by RT-PCR (Flu A&B, Covid) Nasopharyngeal Swab     Status: None   Collection Time: 11/04/20 12:07 AM   Specimen: Nasopharyngeal Swab; Nasopharyngeal(NP) swabs in vial transport medium  Result Value Ref Range Status   SARS Coronavirus 2 by RT PCR NEGATIVE NEGATIVE Final    Comment: (NOTE) SARS-CoV-2 target nucleic acids are NOT DETECTED.  The SARS-CoV-2 RNA is generally detectable in upper respiratory specimens during the acute phase of infection. The lowest concentration of SARS-CoV-2 viral copies this assay can detect is 138 copies/mL. A negative result does not preclude SARS-Cov-2 infection and should not be used as the sole basis for treatment or other patient management decisions. A negative result may occur with  improper specimen collection/handling, submission of specimen other than nasopharyngeal swab, presence of viral mutation(s) within the areas targeted by this assay, and inadequate number of viral copies(<138 copies/mL). A negative result must be combined with clinical observations, patient history, and epidemiological information. The expected result is Negative.  Fact Sheet for Patients:  EntrepreneurPulse.com.au  Fact Sheet for Healthcare Providers:  IncredibleEmployment.be  This test is no t yet approved or cleared by the [place] FDA and  has been authorized for detection and/or diagnosis of SARS-CoV-2 by FDA under an Emergency Use Authorization (EUA). This EUA will remain  in effect (meaning this test can be used) for the duration of the COVID-19 declaration under Section 564(b)(1) of the Act, 21 U.S.C.section 360bbb-3(b)(1), unless the authorization is terminated  or revoked sooner.       Influenza A by PCR NEGATIVE NEGATIVE Final   Influenza B by PCR NEGATIVE NEGATIVE Final    Comment: (NOTE) The Xpert Xpress SARS-CoV-2/FLU/RSV plus assay is intended as an aid in the diagnosis of influenza from Nasopharyngeal swab specimens and should not be used as a sole basis for treatment. Nasal washings and aspirates are unacceptable for Xpert Xpress SARS-CoV-2/FLU/RSV testing.  Fact Sheet for Patients: EntrepreneurPulse.com.au  Fact Sheet for Healthcare Providers: IncredibleEmployment.be  This test is not yet approved or cleared by the [place] FDA and has been authorized for detection and/or diagnosis of SARS-CoV-2 by FDA under an Emergency Use Authorization (EUA). This EUA will remain in effect (meaning this test can be used) for the duration of the COVID-19 declaration under Section 564(b)(1) of the Act, 21 U.S.C. section 360bbb-3(b)(1), unless the authorization is terminated or revoked.  Performed at [facility], [place] [address] [place], [place] [address]           Radiology Studies: No results found.      Scheduled Meds:  amiodarone  200 mg Oral Daily   amLODipine  10 mg Oral Daily   atorvastatin  80 mg Oral Q supper   Chlorhexidine Gluconate Cloth  6 each Topical Q0600   clopidogrel  75 mg Oral Daily   heparin  5,000 Units Subcutaneous Q8H   lanthanum  2,000 mg Oral TID WC   pantoprazole  40 mg Oral Daily   Continuous Infusions:   LOS: 3 days    Time spent: 27 minutes  Darliss Cheney, MD [facility]

## 2020-11-08 NOTE — Plan of Care (Signed)
  Problem: Education: Goal: Knowledge of General Education information will improve Description: Including pain rating scale, medication(s)/side effects and non-pharmacologic comfort measures Outcome: Progressing   Problem: Health Behavior/Discharge Planning: Goal: Ability to manage health-related needs will improve Outcome: Progressing   Problem: Clinical Measurements: Goal: Ability to maintain clinical measurements within normal limits will improve Outcome: Progressing Goal: Will remain free from infection Outcome: Progressing Goal: Diagnostic test results will improve Outcome: Progressing Goal: Respiratory complications will improve Outcome: Progressing Goal: Cardiovascular complication will be avoided Outcome: Progressing   Problem: Skin Integrity: Goal: Risk for impaired skin integrity will decrease Outcome: Progressing   Problem: Pain Managment: Goal: General experience of comfort will improve Outcome: Progressing   Problem: Elimination: Goal: Will not experience complications related to bowel motility Outcome: Progressing Goal: Will not experience complications related to urinary retention Outcome: Progressing

## 2020-11-08 NOTE — Progress Notes (Signed)
[facility] ROUNDING NOTE   Subjective:   Interval History: 70 year old end-stage renal disease Monday Wednesday Friday dialysis.  Admitted with altered mental status increased lethargy and hallucinations.  Confusion thought to be secondary to metabolic encephalopathy and hallucinations secondary to opiates and anesthesia use.  Patient has undergone a recent left dorsal/palmar wrist mass excision 11/02/2020.  He was using OxyContin for pain control.  The patient's last dialysis treatment was 11/04/2020 with 3 L removed.  Blood pressure 132/72 pulse 76 temperature 98.4 O2 sats 94% room air.Plan dialysis 11/08/2020  Sodium 136 potassium 4.3 chloride 98 CO2 26 BUN 53 creatinine 10.6 glucose 102 calcium 9 albumin 3.  Hemoglobin 8.3  Objective:  Vital signs in last 24 hours:  Temp:  [90 F (32.2 C)-98.8 F (37.1 C)] 98.4 F (36.9 C) (08/30 0400) Pulse Rate:  [64-75] 68 (08/30 0400) Resp:  [15-20] 18 (08/30 0400) BP: (107-154)/(34-81) 132/72 (08/30 0400) SpO2:  [90 %-96 %] 94 % (08/30 0400) Weight:  [70.3 kg-73.3 kg] 70.3 kg (08/29 1731)  Weight change:  Filed Weights   11/04/20 1741 11/07/20 1402 11/07/20 1731  Weight: 73.8 kg 73.3 kg 70.3 kg    Intake/Output: I/O last 3 completed shifts: In: -  Out: 3000 [Other:3000]   Intake/Output this shift:  No intake/output data recorded.  General:chronically ill appearing male in NAD Heart:RRR, no mrg Lungs:CTAB, nml WOB on RA Abdomen:soft, NTND Extremities:R BKA, no edema b/l Dialysis Access: RU AVF +b/t    Basic Metabolic Panel: Recent Labs  Lab 11/02/20 1244 11/02/20 1244 11/03/20 2144 11/04/20 0130 11/04/20 0143 11/06/20 1119 11/07/20 0116  NA 137  --  137 138 137 136 136  K 3.2*  --  3.7 3.5 3.5 3.9 4.3  CL 94*  --  93* 94*  --  97* 98  CO2  --   --  27 25  --  26 26  GLUCOSE 144*  --  106* 102*  --  109* 102*  BUN 23  --  40* 42*  --  45* 52*  CREATININE 6.50*  --  9.05* 9.36*  --  9.53* 10.60*   CALCIUM  --    < > 9.6 9.5  --  9.3 9.0  MG  --   --   --  2.1  --   --  2.2  PHOS  --   --   --  8.4*  --  6.5*  --    < > = values in this interval not displayed.     Liver Function Tests: Recent Labs  Lab 11/03/20 2144 11/04/20 0130 11/06/20 1119 11/07/20 0116  AST 19 18  --  21  ALT 7 6  --  <5  ALKPHOS 51 50  --  47  BILITOT 0.7 0.7  --  0.9  PROT 6.6 6.8  --  5.9*  ALBUMIN 3.7 3.5 3.0* 3.0*    No results for input(s): LIPASE, AMYLASE in the last 168 hours. Recent Labs  Lab 11/04/20 0130  AMMONIA 14     CBC: Recent Labs  Lab 11/02/20 1244 11/03/20 2144 11/04/20 0130 11/04/20 0143 11/06/20 1119  WBC  --  7.9 9.4  --  6.4  NEUTROABS  --  6.3 7.5  --  4.9  HGB 8.5* 9.3* 9.3* 9.2* 8.3*  HCT 25.0* 28.8* 29.0* 27.0* 26.0*  MCV  --  96.6 97.3  --  95.6  PLT  --  133* 129*  --  123*     Cardiac  Enzymes: No results for input(s): CKTOTAL, CKMB, CKMBINDEX, TROPONINI in the last 168 hours.  BNP: Invalid input(s): POCBNP  CBG: No results for input(s): GLUCAP in the last 168 hours.  Microbiology: Results for orders placed or performed during the hospital encounter of 11/03/20  Resp Panel by RT-PCR (Flu A&B, Covid) Nasopharyngeal Swab     Status: None   Collection Time: 11/04/20 12:07 AM   Specimen: Nasopharyngeal Swab; Nasopharyngeal(NP) swabs in vial transport medium  Result Value Ref Range Status   SARS Coronavirus 2 by RT PCR NEGATIVE NEGATIVE Final    Comment: (NOTE) SARS-CoV-2 target nucleic acids are NOT DETECTED.  The SARS-CoV-2 RNA is generally detectable in upper respiratory specimens during the acute phase of infection. The lowest concentration of SARS-CoV-2 viral copies this assay can detect is 138 copies/mL. A negative result does not preclude SARS-Cov-2 infection and should not be used as the sole basis for treatment or other patient management decisions. A negative result may occur with  improper specimen collection/handling, submission  of specimen other than nasopharyngeal swab, presence of viral mutation(s) within the areas targeted by this assay, and inadequate number of viral copies(<138 copies/mL). A negative result must be combined with clinical observations, patient history, and epidemiological information. The expected result is Negative.  Fact Sheet for Patients:  EntrepreneurPulse.com.au  Fact Sheet for Healthcare Providers:  IncredibleEmployment.be  This test is no t yet approved or cleared by the [place] FDA and  has been authorized for detection and/or diagnosis of SARS-CoV-2 by FDA under an Emergency Use Authorization (EUA). This EUA will remain  in effect (meaning this test can be used) for the duration of the COVID-19 declaration under Section 564(b)(1) of the Act, 21 U.S.C.section 360bbb-3(b)(1), unless the authorization is terminated  or revoked sooner.       Influenza A by PCR NEGATIVE NEGATIVE Final   Influenza B by PCR NEGATIVE NEGATIVE Final    Comment: (NOTE) The Xpert Xpress SARS-CoV-2/FLU/RSV plus assay is intended as an aid in the diagnosis of influenza from Nasopharyngeal swab specimens and should not be used as a sole basis for treatment. Nasal washings and aspirates are unacceptable for Xpert Xpress SARS-CoV-2/FLU/RSV testing.  Fact Sheet for Patients: EntrepreneurPulse.com.au  Fact Sheet for Healthcare Providers: IncredibleEmployment.be  This test is not yet approved or cleared by the [place] FDA and has been authorized for detection and/or diagnosis of SARS-CoV-2 by FDA under an Emergency Use Authorization (EUA). This EUA will remain in effect (meaning this test can be used) for the duration of the COVID-19 declaration under Section 564(b)(1) of the Act, 21 U.S.C. section 360bbb-3(b)(1), unless the authorization is terminated or revoked.  Performed at [facility], [place] [address],  [place], [place] [address]    *Note: Due to a large number of results and/or encounters for the requested time period, some results have not been displayed. A complete set of results can be found in Results Review.    Coagulation Studies: No results for input(s): LABPROT, INR in the last 72 hours.  Urinalysis: No results for input(s): COLORURINE, LABSPEC, PHURINE, GLUCOSEU, HGBUR, BILIRUBINUR, KETONESUR, PROTEINUR, UROBILINOGEN, NITRITE, LEUKOCYTESUR in the last 72 hours.  Invalid input(s): APPERANCEUR    Imaging: No results found.   Medications:     amiodarone  200 mg Oral Daily   amLODipine  10 mg Oral Daily   atorvastatin  80 mg Oral Q supper   Chlorhexidine Gluconate Cloth  6 each Topical Q0600   clopidogrel  75 mg  Oral Daily   heparin  5,000 Units Subcutaneous Q8H   lanthanum  2,000 mg Oral TID WC   pantoprazole  40 mg Oral Daily   acetaminophen **OR** acetaminophen, hydrALAZINE, LORazepam, ondansetron **OR** ondansetron (ZOFRAN) IV, polyethylene glycol  Assessment/ Plan:   Dialysis Orders: [facility] MWF 4hrs 400/AF 1.5 2K 2Ca Profile 4 EDW 76.5kg RU AVF Hep 2400 Venofer 152mcg x5HD Mircera 46mcg on 8/17 Calcitriol 74mcg qHD  Assessment/Plan:  Confusion/hallucinations - CT negative.  Etiology unclear.  Improved today. Debility - plan for SNF placement  ESRD -  on HD MWF.  Plan for HD 11/08/2020 per regular schedule.    BP variable. Continue home meds.  Does not appear volume overloaded.  Net UF 3L last HD. Continue UF as tolerated.   Anemia of CKD - Last Hgb 9.2. ESA due 8/31.   Secondary Hyperparathyroidism -  Ca in goal.  Phos elevated, continue binders and VDRA.  Nutrition - Renal diet w/fluid restrictions CAD/CHF/severe AS s/p AVR PVD s/p R BKA   LOS: 3 Sherril Croon @TODAY @7 :18 AM

## 2020-11-08 NOTE — Progress Notes (Signed)
Physical Therapy Treatment Patient Details Name: Jose Garrett MRN: [id number] DOB: November 22, 1950 Today's Date: 11/08/2020    History of Present Illness 70 y/o male presented to ED on 8/25 for confusion and hallucinations. Patient recently with cyst removal on L hand on 8/24. Suspect due to disequilibrium syndrome. PMH: HTN, ESRD on dialysis MWF, CAD, GERD, CHF, severe aortic stenosis s/p TAVR (10/2018), HLD, R BKA 04/2019.    PT Comments    Pt received in supine, agreeable to therapy session and with good participation in seated LE strengthening exercises and seated scooting transfer training. Pt needing up to minA to safely scoot to drop arm recliner, deferred standing transfer training per pt request as his prosthetic not yet delivered to room. Per pt report, this should arrive later tonight (his family to bring in). Pt continues to benefit from PT services to progress toward functional mobility goals. Plan to assess standing transfers next session if prosthetic limb available, pt may benefit from platform attachment for LUE support to avoid WB through L wrist after recent surgery.   Follow Up Recommendations  SNF;Supervision/Assistance - 24 hour     Equipment Recommendations  None recommended by PT    Recommendations for Other Services       Precautions / Restrictions Precautions Precautions: Fall Precaution Comments: recent L hand sx with soft dressing to elbow Restrictions Weight Bearing Restrictions: Yes Other Position/Activity Restrictions: (Per pt report he is to avoid lifting and avoid WBing through Lt wrist)    Mobility  Bed Mobility Overal bed mobility: Needs Assistance Bed Mobility: Supine to Sit     Supine to sit: Min assist     General bed mobility comments: minA needed to elevate trunk into sitting    Transfers Overall transfer level: Needs assistance   Transfers: Lateral/Scoot Transfers          Lateral/Scoot Transfers: Min assist General transfer  comment: pt performed lateral scoot transfer to drop arm recliner, able to scoot to R end of bed with minA but does better scooting to L side, mostly using RUE and LLE for scooting (not using LUE due to recent surgery)  Ambulation/Gait     General Gait Details: deferred, pt does not have prosthetic in room yet       Balance Overall balance assessment: Needs assistance Sitting-balance support: No upper extremity supported;Feet unsupported Sitting balance-Leahy Scale: Fair Sitting balance - Comments: sitting EOB with Supervision at most, able to weight shift L/R and tap elbow with fair balance                  Cognition Arousal/Alertness: Awake/alert Behavior During Therapy: WFL for tasks assessed/performed Overall Cognitive Status: Within Functional Limits for tasks assessed                   General Comments: overall WFL for simple mobility tasks      Exercises Other Exercises Other Exercises: seated BLE AROM: knee extension, hip flexion, ankle pumps (LLE)    General Comments General comments (skin integrity, edema, etc.): HR 75-78 bpm      Pertinent Vitals/Pain Pain Assessment: Faces Faces Pain Scale: Hurts a little bit Pain Location: L hand post-surgical and L low back pain Pain Descriptors / Indicators: Discomfort Pain Intervention(s): Monitored during session;Repositioned     PT Goals (current goals can now be found in the care plan section) Acute Rehab PT Goals Patient Stated Goal: to go to rehab PT Goal Formulation: With patient/family Time For Goal Achievement: 11/19/20 Progress  towards PT goals: Progressing toward goals    Frequency    Min 2X/week      PT Plan Current plan remains appropriate       AM-PAC PT "6 Clicks" Mobility   Outcome Measure  Help needed turning from your back to your side while in a flat bed without using bedrails?: A Little Help needed moving from lying on your back to sitting on the side of a flat bed  without using bedrails?: A Little Help needed moving to and from a bed to a chair (including a wheelchair)?: A Little Help needed standing up from a chair using your arms (e.g., wheelchair or bedside chair)?: A Lot Help needed to walk in hospital room?: Total Help needed climbing 3-5 steps with a railing? : Total 6 Click Score: 13    End of Session   Activity Tolerance: Patient tolerated treatment well Patient left: in chair;with call bell/phone within reach;with chair alarm set Nurse Communication: Mobility status;Other (comment) (technique for return to bed (does best with lateral scoot from drop arm recliner, may do better to L side so turn chair around when returning to bed)) PT Visit Diagnosis: Unsteadiness on feet (R26.81);Muscle weakness (generalized) (M62.81);Difficulty in walking, not elsewhere classified (R26.2)     Time: 2297-9892 PT Time Calculation (min) (ACUTE ONLY): 22 min  Charges:  $Therapeutic Activity: 8-22 mins                     Quadarius Henton P., PTA Acute Rehabilitation Services Pager: [phone] Office: [place] 11/08/2020, 6:13 PM

## 2020-11-08 NOTE — Progress Notes (Signed)
Occupational Therapy Treatment Patient Details Name: Jose Garrett MRN: [id number] DOB: 04/21/1950 Today's Date: 11/08/2020    History of present illness 70 y/o male presented to ED on 8/25 for confusion and hallucinations. Patient recently with cyst removal on L hand on 8/24. Suspect due to disequilibrium syndrome. PMH: HTN, ESRD on dialysis MWF, CAD, GERD, CHF, severe aortic stenosis s/p TAVR (10/2018), HLD, R BKA 04/2019.   OT comments  Pt making steady progress towards OT goals this session. Session focus on functional mobility and sitting balance EOB in prep for higher level BADLs. Pt declined OOB mobility despite education on increasing independence with functional mobility with pt reporting that he would rather wait for his family to bring his prosthetic. Overall, pt requires MINA  for bed mobility and MOD A for UB ADLS, pt able sot sit EOB with gross supervision. Pt would continue to benefit from skilled occupational therapy while admitted and after d/c to address the below listed limitations in order to improve overall functional mobility and facilitate independence with BADL participation. DC plan remains appropriate, will follow acutely per POC.     Follow Up Recommendations  SNF    Equipment Recommendations  None recommended by OT    Recommendations for Other Services      Precautions / Restrictions Precautions Precautions: Fall Precaution Comments: recent L hand sx Restrictions LUE Weight Bearing:  (Per pt report he is to avoid lifting and avoid WBing through Lt wrist)       Mobility Bed Mobility Overal bed mobility: Needs Assistance Bed Mobility: Supine to Sit     Supine to sit: Min assist;HOB elevated (HOB slightly elevated)     General bed mobility comments: MINA  needed to elevate trunk into sitting    Transfers                 General transfer comment: pt declined OOB mobility despite max encouragment and demo of lateral scoot transfer to drop arm  recliner and drop arm 3n1. pt reports wanting to wait for his family to bring his prosthetic    Balance Overall balance assessment: Needs assistance Sitting-balance support: No upper extremity supported;Feet unsupported Sitting balance-Leahy Scale: Fair Sitting balance - Comments: sitting EOB with nu UE support for ADLS                                   ADL either performed or assessed with clinical judgement   ADL Overall ADL's : Needs assistance/impaired   Eating/Feeding Details (indicate cue type and reason): pt reports feeding has been difficult d/t impaired sensation in RUE and banadage around LUE, pt reports needing total A for feeding at times depending on texture of food.     Upper Body Bathing: Moderate assistance;Sitting Upper Body Bathing Details (indicate cue type and reason): EOB               Toilet Transfer Details (indicate cue type and reason): pt declined OOB transfer and reports preferring to use bed pad, even though this OTA demo'ed use of drop arm 3n1         Functional mobility during ADLs: Minimal assistance (bed mobility only) General ADL Comments: pt continues to present with decreased activity tolerance, impaired strength and impaired ability to functionally utilize LUE     Vision       Perception     Praxis      Cognition Arousal/Alertness: Awake/alert  Behavior During Therapy: WFL for tasks assessed/performed Overall Cognitive Status: Within Functional Limits for tasks assessed                                 General Comments: overall WFL for simple ADL tasks        Exercises     Shoulder Instructions       General Comments      Pertinent Vitals/ Pain       Pain Assessment: 0-10 Pain Score: 7  Pain Location: back and R phantom pain Pain Descriptors / Indicators: Discomfort;Grimacing Pain Intervention(s): Limited activity within patient's tolerance;Monitored during session;Repositioned  Home  Living                                          Prior Functioning/Environment              Frequency  Min 2X/week        Progress Toward Goals  OT Goals(current goals can now be found in the care plan section)  Progress towards OT goals: Progressing toward goals  Acute Rehab OT Goals Patient Stated Goal: to go to rehab OT Goal Formulation: With patient Time For Goal Achievement: 11/18/20 Potential to Achieve Goals: Good ADL Goals Pt Will Perform Eating: with set-up;with supervision;sitting Pt Will Perform Grooming: with set-up;with supervision;sitting Pt Will Perform Upper Body Bathing: with set-up;with supervision;sitting Pt Will Transfer to Toilet: bedside commode;with min assist  Plan Discharge plan remains appropriate;Frequency remains appropriate    Co-evaluation                 AM-PAC OT "6 Clicks" Daily Activity     Outcome Measure   Help from another person eating meals?: A Lot Help from another person taking care of personal grooming?: A Little Help from another person toileting, which includes using toliet, bedpan, or urinal?: A Lot Help from another person bathing (including washing, rinsing, drying)?: A Lot Help from another person to put on and taking off regular upper body clothing?: A Lot Help from another person to put on and taking off regular lower body clothing?: A Lot 6 Click Score: 13    End of Session    OT Visit Diagnosis: Unsteadiness on feet (R26.81);Cognitive communication deficit (R41.841)   Activity Tolerance Patient tolerated treatment well   Patient Left in bed;with call bell/phone within reach;with bed alarm set;Other (comment) (sitting EOB waiting on breakfast)   Nurse Communication Mobility status        Time: 1610-9604 OT Time Calculation (min): 15 min  Charges: OT General Charges $OT Visit: 1 Visit OT Treatments $Self Care/Home Management : 8-22 mins  Harley Alto., COTA/L Acute  Rehabilitation Services [phone]    Jose Garrett 11/08/2020, 8:36 AM

## 2020-11-09 LAB — CBC
HCT: 29.6 % — ABNORMAL LOW (ref 39.0–52.0)
Hemoglobin: 9.4 g/dL — ABNORMAL LOW (ref 13.0–17.0)
MCH: 30.7 pg (ref 26.0–34.0)
MCHC: 31.8 g/dL (ref 30.0–36.0)
MCV: 96.7 fL (ref 80.0–100.0)
Platelets: 150 10*3/uL (ref 150–400)
RBC: 3.06 MIL/uL — ABNORMAL LOW (ref 4.22–5.81)
RDW: 15.9 % — ABNORMAL HIGH (ref 11.5–15.5)
WBC: 4.9 10*3/uL (ref 4.0–10.5)
nRBC: 0 % (ref 0.0–0.2)

## 2020-11-09 LAB — RENAL FUNCTION PANEL
Albumin: 3.3 g/dL — ABNORMAL LOW (ref 3.5–5.0)
Anion gap: 14 (ref 5–15)
BUN: 38 mg/dL — ABNORMAL HIGH (ref 8–23)
CO2: 26 mmol/L (ref 22–32)
Calcium: 8.9 mg/dL (ref 8.9–10.3)
Chloride: 99 mmol/L (ref 98–111)
Creatinine, Ser: 7.67 mg/dL — ABNORMAL HIGH (ref 0.61–1.24)
GFR, Estimated: 7 mL/min — ABNORMAL LOW (ref >60)
Glucose, Bld: 108 mg/dL — ABNORMAL HIGH (ref 70–99)
Phosphorus: 5.3 mg/dL — ABNORMAL HIGH (ref 2.5–4.6)
Potassium: 3.7 mmol/L (ref 3.5–5.1)
Sodium: 139 mmol/L (ref 135–145)

## 2020-11-09 LAB — SARS CORONAVIRUS 2 (TAT 6-24 HRS): SARS Coronavirus 2: NEGATIVE

## 2020-11-09 MED ORDER — CHLORHEXIDINE GLUCONATE CLOTH 2 % EX PADS
6.0000 | MEDICATED_PAD | Freq: Every day | CUTANEOUS | Status: DC
  Administered 2020-11-09 – 2020-11-14 (×6): 6 via TOPICAL

## 2020-11-09 NOTE — TOC Progression Note (Signed)
Transition of Care [facility]) - Progression Note    Patient Details  Name: Jose Garrett MRN: [id number] Date of Birth: November 23, 1950  Transition of Care [facility]) CM/SW [place], [place] Phone Number: 11/09/2020, 11:43 AM  Clinical Narrative:   CSW spoke with patient's daughter, [name], to discuss SNF offers. Family would like to choose offer at [facility]. CSW spoke with [facility] and confirmed bed availability. CSW to initiate insurance authorization request and follow for discharge to SNF.    Expected Discharge Plan: Skilled Nursing Facility Barriers to Discharge: SNF Pending bed offer, Continued Medical Work up  Expected Discharge Plan and Services Expected Discharge Plan: [place] Choice: [place] arrangements for the past 2 months: Single Family Home                                       Social Determinants of Health (SDOH) Interventions    Readmission Risk Interventions Readmission Risk Prevention Plan 04/15/2019 10/23/2018 08/20/2018  Transportation Screening Complete Complete -  PCP or Specialist Appt within 3-5 Days - Complete -  HRI or [place] - Complete -  Social Work Consult for [place] Planning/Counseling - Complete -  Palliative Care Screening - Not Applicable -  Medication Review Press photographer) Complete Complete -  PCP or Specialist appointment within 3-5 days of discharge Complete - Complete  HRI or Home Care Consult Complete - Complete  SW Recovery Care/Counseling Consult Complete - Complete  Palliative Care Screening Not Applicable - Not [place] Patient Refused - Not Applicable  Some recent data might be hidden

## 2020-11-09 NOTE — Progress Notes (Signed)
Patient taken in bed by transport at this time to hemodialysis.

## 2020-11-09 NOTE — Care Management Important Message (Signed)
Important Message  Patient Details  Name: Jose Garrett MRN: [id number] Date of Birth: 30-Nov-1950   Medicare Important Message Given:  Yes     [name] 11/09/2020, 3:04 PM

## 2020-11-09 NOTE — Progress Notes (Signed)
PROGRESS NOTE    Jose Garrett  VZD:[id number] DOB: 1951/01/01 DOA: 11/03/2020 PCP: [name], NP    Brief Narrative:  ESRD hemodialysis Monday Wednesday Friday, chronic combined heart failure, severe aortic stenosis status post TAVR, hypertension, hyperlipidemia, GERD, right BKA, excisional biopsy of the left forearm lesion 8/24 brought to the ER with worsening disorientation, lethargy and hallucinations.  Lives at home with daughter.  Work-up so far negative. Patient received general anesthesia on 8/24 for cyst excision.  Probably encephalopathy due to medications, opiates and benzodiazepines.  Hemodynamically stable.  Neurologically stable.  Encephalopathy resolved.  Due to his ESRD status, nephrology was consulted and he received his dialysis.  Rest of the medical issues remained stable.  Evaluated by PT OT and they recommended SNF.   Assessment & Plan:   Principal Problem:   Acute metabolic encephalopathy Active Problems:   Essential hypertension, benign   ESRD (end stage renal disease) on dialysis (HCC)   Coronary artery disease involving native coronary artery of native heart without angina pectoris   GERD without esophagitis   Chronic combined systolic and diastolic CHF (congestive heart failure) (HCC)   Mixed hyperlipidemia   Altered mental status  Acute metabolic encephalopathy: Probably due to general anesthesia in a patient with hemodialysis.  Fully alert and oriented.  Continue low-dose of benzodiazepine.    ESRD on hemodialysis: Continue dialysis per schedule by nephrology  Extreme debility, right BKA status and now with left hand surgery: PT OT on board they recommended SNF for rehab.  TOC working on placement  Essential hypertension: Blood pressure stable.  Continue amlodipine.  GERD without esophagitis: Continue Protonix.  Chronic combined heart failure: Currently euvolemic.  Managed with dialysis.  Hyperlipidemia: Continue statin Patient is medically stable  for discharge.  Per TOC, family has selected a facility and we are waiting for insurance authorization and hoping for discharge tomorrow.  DVT prophylaxis: heparin injection 5,000 Units Start: 11/04/20 1400   Code Status: Full code Family Communication: None at bedside. Disposition Plan: SNF.  Status is: Inpatient  Remains inpatient appropriate because:Unsafe d/c plan  Dispo: The patient is from: Home              Anticipated d/c is to: SNF              Patient currently is medically stable to d/c.   Difficult to place patient No        Consultants:  Nephrology  Procedures:  Routine hemodialysis 8/26  Antimicrobials:  None   Subjective: seen and examined.  He has no complaints.  Objective: Vitals:   11/08/20 2024 11/08/20 2331 11/09/20 0357 11/09/20 0745  BP: (!) 110/44 126/66 (!) 125/49 139/66  Pulse: 73 72 72 71  Resp: 18 18 18 19   Temp: 98.3 F (36.8 C) 98.3 F (36.8 C) 98.2 F (36.8 C) 98.3 F (36.8 C)  TempSrc: Oral Oral Oral Oral  SpO2: 98% 95% 94% 96%  Weight:        Intake/Output Summary (Last 24 hours) at 11/09/2020 1109 Last data filed at 11/09/2020 0900 Gross per 24 hour  Intake 50 ml  Output --  Net 50 ml    Filed Weights   11/04/20 1741 11/07/20 1402 11/07/20 1731  Weight: 73.8 kg 73.3 kg 70.3 kg    Examination:  General exam: Appears calm and comfortable  Respiratory system: Clear to auscultation. Respiratory effort normal. Cardiovascular system: S1 & S2 heard, RRR. No JVD, murmurs, rubs, gallops or clicks. No pedal edema. Gastrointestinal  system: Abdomen is nondistended, soft and nontender. No organomegaly or masses felt. Normal bowel sounds heard. Central nervous system: Alert and oriented. No focal neurological deficits. Extremities: Right BKA. Skin: No rashes, lesions or ulcers.  Psychiatry: Judgement and insight appear normal. Mood & affect appropriate.    Data Reviewed: I have personally reviewed following labs and  imaging studies  CBC: Recent Labs  Lab 11/02/20 1244 11/03/20 2144 11/04/20 0130 11/04/20 0143 11/06/20 1119  WBC  --  7.9 9.4  --  6.4  NEUTROABS  --  6.3 7.5  --  4.9  HGB 8.5* 9.3* 9.3* 9.2* 8.3*  HCT 25.0* 28.8* 29.0* 27.0* 26.0*  MCV  --  96.6 97.3  --  95.6  PLT  --  133* 129*  --  123*    Basic Metabolic Panel: Recent Labs  Lab 11/02/20 1244 11/03/20 2144 11/04/20 0130 11/04/20 0143 11/06/20 1119 11/07/20 0116  NA 137 137 138 137 136 136  K 3.2* 3.7 3.5 3.5 3.9 4.3  CL 94* 93* 94*  --  97* 98  CO2  --  27 25  --  26 26  GLUCOSE 144* 106* 102*  --  109* 102*  BUN 23 40* 42*  --  45* 52*  CREATININE 6.50* 9.05* 9.36*  --  9.53* 10.60*  CALCIUM  --  9.6 9.5  --  9.3 9.0  MG  --   --  2.1  --   --  2.2  PHOS  --   --  8.4*  --  6.5*  --     GFR: Estimated Creatinine Clearance: 6.6 mL/min (A) (by C-G formula based on SCr of 10.6 mg/dL (H)). Liver Function Tests: Recent Labs  Lab 11/03/20 2144 11/04/20 0130 11/06/20 1119 11/07/20 0116  AST 19 18  --  21  ALT 7 6  --  <5  ALKPHOS 51 50  --  47  BILITOT 0.7 0.7  --  0.9  PROT 6.6 6.8  --  5.9*  ALBUMIN 3.7 3.5 3.0* 3.0*    No results for input(s): LIPASE, AMYLASE in the last 168 hours. Recent Labs  Lab 11/04/20 0130  AMMONIA 14    Coagulation Profile: No results for input(s): INR, PROTIME in the last 168 hours. Cardiac Enzymes: No results for input(s): CKTOTAL, CKMB, CKMBINDEX, TROPONINI in the last 168 hours. BNP (last 3 results) No results for input(s): PROBNP in the last 8760 hours. HbA1C: No results for input(s): HGBA1C in the last 72 hours. CBG: Recent Labs  Lab 11/08/20 0843  GLUCAP 112*    Lipid Profile: No results for input(s): CHOL, HDL, LDLCALC, TRIG, CHOLHDL, LDLDIRECT in the last 72 hours. Thyroid Function Tests: No results for input(s): TSH, T4TOTAL, FREET4, T3FREE, THYROIDAB in the last 72 hours.  Anemia Panel: No results for input(s): VITAMINB12, FOLATE, FERRITIN, TIBC,  IRON, RETICCTPCT in the last 72 hours.  Sepsis Labs: Recent Labs  Lab 11/04/20 1430  PROCALCITON 0.20     Recent Results (from the past 240 hour(s))  Aerobic/Anaerobic Culture w Gram Stain (surgical/deep wound)     Status: None   Collection Time: 11/02/20  2:24 PM   Specimen: Soft Tissue, Other  Result Value Ref Range Status   Specimen Description WOUND LEFT WRIST  Final   Special Requests MASS  Final   Gram Stain   Final    NO SQUAMOUS EPITHELIAL CELLS SEEN NO WBC SEEN FEW GRAM NEGATIVE RODS    Culture   Final  RARE NORMAL SKIN FLORA NO ANAEROBES ISOLATED Performed at [facility], [place] [address], [place], [place] [address]    Report Status 11/07/2020 FINAL  Final  Resp Panel by RT-PCR (Flu A&B, Covid) Nasopharyngeal Swab     Status: None   Collection Time: 11/04/20 12:07 AM   Specimen: Nasopharyngeal Swab; Nasopharyngeal(NP) swabs in vial transport medium  Result Value Ref Range Status   SARS Coronavirus 2 by RT PCR NEGATIVE NEGATIVE Final    Comment: (NOTE) SARS-CoV-2 target nucleic acids are NOT DETECTED.  The SARS-CoV-2 RNA is generally detectable in upper respiratory specimens during the acute phase of infection. The lowest concentration of SARS-CoV-2 viral copies this assay can detect is 138 copies/mL. A negative result does not preclude SARS-Cov-2 infection and should not be used as the sole basis for treatment or other patient management decisions. A negative result may occur with  improper specimen collection/handling, submission of specimen other than nasopharyngeal swab, presence of viral mutation(s) within the areas targeted by this assay, and inadequate number of viral copies(<138 copies/mL). A negative result must be combined with clinical observations, patient history, and epidemiological information. The expected result is Negative.  Fact Sheet for Patients:  EntrepreneurPulse.com.au  Fact Sheet for Healthcare Providers:   IncredibleEmployment.be  This test is no t yet approved or cleared by the [place] FDA and  has been authorized for detection and/or diagnosis of SARS-CoV-2 by FDA under an Emergency Use Authorization (EUA). This EUA will remain  in effect (meaning this test can be used) for the duration of the COVID-19 declaration under Section 564(b)(1) of the Act, 21 U.S.C.section 360bbb-3(b)(1), unless the authorization is terminated  or revoked sooner.       Influenza A by PCR NEGATIVE NEGATIVE Final   Influenza B by PCR NEGATIVE NEGATIVE Final    Comment: (NOTE) The Xpert Xpress SARS-CoV-2/FLU/RSV plus assay is intended as an aid in the diagnosis of influenza from Nasopharyngeal swab specimens and should not be used as a sole basis for treatment. Nasal washings and aspirates are unacceptable for Xpert Xpress SARS-CoV-2/FLU/RSV testing.  Fact Sheet for Patients: EntrepreneurPulse.com.au  Fact Sheet for Healthcare Providers: IncredibleEmployment.be  This test is not yet approved or cleared by the [place] FDA and has been authorized for detection and/or diagnosis of SARS-CoV-2 by FDA under an Emergency Use Authorization (EUA). This EUA will remain in effect (meaning this test can be used) for the duration of the COVID-19 declaration under Section 564(b)(1) of the Act, 21 U.S.C. section 360bbb-3(b)(1), unless the authorization is terminated or revoked.  Performed at [facility], [place] [address], [place], [place] [address]      Radiology Studies: No results found.  Scheduled Meds:  amiodarone  200 mg Oral Daily   amLODipine  10 mg Oral Daily   atorvastatin  80 mg Oral Q supper   Chlorhexidine Gluconate Cloth  6 each Topical Q0600   Chlorhexidine Gluconate Cloth  6 each Topical Q0600   clopidogrel  75 mg Oral Daily   heparin  5,000 Units Subcutaneous Q8H   lanthanum  2,000 mg Oral TID WC   pantoprazole  40 mg  Oral Daily   Continuous Infusions:   LOS: 4 days   Time spent: 25 minutes  Darliss Cheney, MD [facility]

## 2020-11-09 NOTE — Progress Notes (Signed)
[facility] ROUNDING NOTE   Subjective:   Interval History: 70 year old end-stage renal disease Monday Wednesday Friday dialysis.  Admitted with altered mental status increased lethargy and hallucinations.  Confusion thought to be secondary to metabolic encephalopathy and hallucinations secondary to opiates and anesthesia use.  Patient has undergone a recent left dorsal/palmar wrist mass excision 11/02/2020.  He was using OxyContin for pain control.  The patient's last dialysis treatment was 11/07/2020 with 3 L removed  Blood pressure 139/66 pulse 87 temperature 98 O2 sats 96% room air  Sodium 136 potassium 4.3 chloride 98 CO2 26 BUN 53 creatinine 10.6 glucose 102 calcium 9 albumin 3.  Hemoglobin 8.3  Objective:  Vital signs in last 24 hours:  Temp:  [98.2 F (36.8 C)-98.3 F (36.8 C)] 98.3 F (36.8 C) (08/31 0745) Pulse Rate:  [71-73] 71 (08/31 0745) Resp:  [18-19] 19 (08/31 0745) BP: (107-139)/(44-73) 139/66 (08/31 0745) SpO2:  [94 %-98 %] 96 % (08/31 0745)  Weight change:  Filed Weights   11/04/20 1741 11/07/20 1402 11/07/20 1731  Weight: 73.8 kg 73.3 kg 70.3 kg    Intake/Output: No intake/output data recorded.   Intake/Output this shift:  No intake/output data recorded.  General:chronically ill appearing male in NAD Heart:RRR, no mrg Lungs:CTAB, nml WOB on RA Abdomen:soft, NTND Extremities:R BKA, no edema b/l Dialysis Access: RU AVF +b/t    Basic Metabolic Panel: Recent Labs  Lab 11/02/20 1244 11/02/20 1244 11/03/20 2144 11/04/20 0130 11/04/20 0143 11/06/20 1119 11/07/20 0116  NA 137  --  137 138 137 136 136  K 3.2*  --  3.7 3.5 3.5 3.9 4.3  CL 94*  --  93* 94*  --  97* 98  CO2  --   --  27 25  --  26 26  GLUCOSE 144*  --  106* 102*  --  109* 102*  BUN 23  --  40* 42*  --  45* 52*  CREATININE 6.50*  --  9.05* 9.36*  --  9.53* 10.60*  CALCIUM  --    < > 9.6 9.5  --  9.3 9.0  MG  --   --   --  2.1  --   --  2.2  PHOS  --   --   --  8.4*  --   6.5*  --    < > = values in this interval not displayed.     Liver Function Tests: Recent Labs  Lab 11/03/20 2144 11/04/20 0130 11/06/20 1119 11/07/20 0116  AST 19 18  --  21  ALT 7 6  --  <5  ALKPHOS 51 50  --  47  BILITOT 0.7 0.7  --  0.9  PROT 6.6 6.8  --  5.9*  ALBUMIN 3.7 3.5 3.0* 3.0*    No results for input(s): LIPASE, AMYLASE in the last 168 hours. Recent Labs  Lab 11/04/20 0130  AMMONIA 14     CBC: Recent Labs  Lab 11/02/20 1244 11/03/20 2144 11/04/20 0130 11/04/20 0143 11/06/20 1119  WBC  --  7.9 9.4  --  6.4  NEUTROABS  --  6.3 7.5  --  4.9  HGB 8.5* 9.3* 9.3* 9.2* 8.3*  HCT 25.0* 28.8* 29.0* 27.0* 26.0*  MCV  --  96.6 97.3  --  95.6  PLT  --  133* 129*  --  123*     Cardiac Enzymes: No results for input(s): CKTOTAL, CKMB, CKMBINDEX, TROPONINI in the last 168 hours.  BNP: Invalid input(s):  POCBNP  CBG: Recent Labs  Lab 11/08/20 0843  GLUCAP 112*    Microbiology: Results for orders placed or performed during the hospital encounter of 11/03/20  Resp Panel by RT-PCR (Flu A&B, Covid) Nasopharyngeal Swab     Status: None   Collection Time: 11/04/20 12:07 AM   Specimen: Nasopharyngeal Swab; Nasopharyngeal(NP) swabs in vial transport medium  Result Value Ref Range Status   SARS Coronavirus 2 by RT PCR NEGATIVE NEGATIVE Final    Comment: (NOTE) SARS-CoV-2 target nucleic acids are NOT DETECTED.  The SARS-CoV-2 RNA is generally detectable in upper respiratory specimens during the acute phase of infection. The lowest concentration of SARS-CoV-2 viral copies this assay can detect is 138 copies/mL. A negative result does not preclude SARS-Cov-2 infection and should not be used as the sole basis for treatment or other patient management decisions. A negative result may occur with  improper specimen collection/handling, submission of specimen other than nasopharyngeal swab, presence of viral mutation(s) within the areas targeted by this assay,  and inadequate number of viral copies(<138 copies/mL). A negative result must be combined with clinical observations, patient history, and epidemiological information. The expected result is Negative.  Fact Sheet for Patients:  EntrepreneurPulse.com.au  Fact Sheet for Healthcare Providers:  IncredibleEmployment.be  This test is no t yet approved or cleared by the [place] FDA and  has been authorized for detection and/or diagnosis of SARS-CoV-2 by FDA under an Emergency Use Authorization (EUA). This EUA will remain  in effect (meaning this test can be used) for the duration of the COVID-19 declaration under Section 564(b)(1) of the Act, 21 U.S.C.section 360bbb-3(b)(1), unless the authorization is terminated  or revoked sooner.       Influenza A by PCR NEGATIVE NEGATIVE Final   Influenza B by PCR NEGATIVE NEGATIVE Final    Comment: (NOTE) The Xpert Xpress SARS-CoV-2/FLU/RSV plus assay is intended as an aid in the diagnosis of influenza from Nasopharyngeal swab specimens and should not be used as a sole basis for treatment. Nasal washings and aspirates are unacceptable for Xpert Xpress SARS-CoV-2/FLU/RSV testing.  Fact Sheet for Patients: EntrepreneurPulse.com.au  Fact Sheet for Healthcare Providers: IncredibleEmployment.be  This test is not yet approved or cleared by the [place] FDA and has been authorized for detection and/or diagnosis of SARS-CoV-2 by FDA under an Emergency Use Authorization (EUA). This EUA will remain in effect (meaning this test can be used) for the duration of the COVID-19 declaration under Section 564(b)(1) of the Act, 21 U.S.C. section 360bbb-3(b)(1), unless the authorization is terminated or revoked.  Performed at [facility], [place] [address], [place], [place] [address]    *Note: Due to a large number of results and/or encounters for the requested time period,  some results have not been displayed. A complete set of results can be found in Results Review.    Coagulation Studies: No results for input(s): LABPROT, INR in the last 72 hours.  Urinalysis: No results for input(s): COLORURINE, LABSPEC, PHURINE, GLUCOSEU, HGBUR, BILIRUBINUR, KETONESUR, PROTEINUR, UROBILINOGEN, NITRITE, LEUKOCYTESUR in the last 72 hours.  Invalid input(s): APPERANCEUR    Imaging: No results found.   Medications:     amiodarone  200 mg Oral Daily   amLODipine  10 mg Oral Daily   atorvastatin  80 mg Oral Q supper   Chlorhexidine Gluconate Cloth  6 each Topical Q0600   clopidogrel  75 mg Oral Daily   heparin  5,000 Units Subcutaneous Q8H   lanthanum  2,000 mg Oral  TID WC   pantoprazole  40 mg Oral Daily   acetaminophen **OR** acetaminophen, hydrALAZINE, LORazepam, ondansetron **OR** ondansetron (ZOFRAN) IV, polyethylene glycol  Assessment/ Plan:   Dialysis Orders: [facility] MWF 4hrs 400/AF 1.5 2K 2Ca Profile 4 EDW 76.5kg RU AVF Hep 2400 Venofer 182mcg x5HD Mircera 68mcg on 8/17 Calcitriol 51mcg qHD  Assessment/Plan:  Confusion/hallucinations - CT negative.  Etiology unclear.  Improved today. Debility - plan for SNF placement  ESRD -  on HD MWF.  Patient did not receive dialysis 11/08/2020 we will plan dialysis 11/09/2020.    BP variable. Continue home meds.  Does not appear volume overloaded.  Net UF 3L last HD. Continue UF as tolerated.   Anemia of CKD - Last Hgb 9.2. ESA due 8/31.   Secondary Hyperparathyroidism -  Ca in goal.  Phos elevated, continue binders and VDRA.  Nutrition - Renal diet w/fluid restrictions CAD/CHF/severe AS s/p AVR PVD s/p R BKA   LOS: 4 Sherril Croon @TODAY @9 :53 AM

## 2020-11-10 MED ORDER — OXYCODONE-ACETAMINOPHEN 5-325 MG PO TABS: 1.0000 | ORAL_TABLET | ORAL | 0 refills | Status: DC | PRN

## 2020-11-10 MED ORDER — LORAZEPAM 1 MG PO TABS: 1.0000 mg | ORAL_TABLET | Freq: Two times a day (BID) | ORAL | 0 refills | Status: DC | PRN

## 2020-11-10 MED ORDER — CHLORHEXIDINE GLUCONATE CLOTH 2 % EX PADS: 6.0000 | MEDICATED_PAD | Freq: Every day | CUTANEOUS | Status: DC

## 2020-11-10 NOTE — Plan of Care (Signed)

## 2020-11-10 NOTE — Progress Notes (Signed)
[facility] ROUNDING NOTE   Subjective:   Interval History: 70 year old end-stage renal disease Monday Wednesday Friday dialysis.  Admitted with altered mental status increased lethargy and hallucinations.  Confusion thought to be secondary to metabolic encephalopathy and hallucinations secondary to opiates and anesthesia use.  Patient has undergone a recent left dorsal/palmar wrist mass excision 11/02/2020.  He was using OxyContin for pain control.   Last dialysis treatment 11/09/2020 with 1 L removed  Blood pressure 111/64 pulse 72 temperature 98.2 O2 sats 95% room air  Sodium 139 potassium 3.7 chloride 99 CO2 26 BUN 38 creatinine 7.67 glucose 108 phosphorus 5.3 albumin 3.3.  Hemoglobin 9.4  Objective:  Vital signs in last 24 hours:  Temp:  [97.7 F (36.5 C)-98.6 F (37 C)] 98.2 F (36.8 C) (09/01 0803) Pulse Rate:  [67-82] 72 (09/01 0803) Resp:  [17-20] 18 (09/01 0803) BP: (87-141)/(42-85) 116/74 (09/01 0803) SpO2:  [93 %-100 %] 95 % (09/01 0803) Weight:  [69.5 kg-70.4 kg] 69.5 kg (08/31 1600)  Weight change:  Filed Weights   11/07/20 1731 11/09/20 1235 11/09/20 1600  Weight: 70.3 kg 70.4 kg 69.5 kg    Intake/Output: I/O last 3 completed shifts: In: 290 [P.O.:290] Out: 1000 [Other:1000]   Intake/Output this shift:  No intake/output data recorded.  General:chronically ill appearing male in NAD Heart:RRR, no mrg Lungs:CTAB, nml WOB on RA Abdomen:soft, NTND Extremities:R BKA, no edema b/l Dialysis Access: RU AVF +b/t    Basic Metabolic Panel: Recent Labs  Lab 11/03/20 2144 11/04/20 0130 11/04/20 0143 11/06/20 1119 11/07/20 0116 11/09/20 1315  NA 137 138 137 136 136 139  K 3.7 3.5 3.5 3.9 4.3 3.7  CL 93* 94*  --  97* 98 99  CO2 27 25  --  26 26 26   GLUCOSE 106* 102*  --  109* 102* 108*  BUN 40* 42*  --  45* 52* 38*  CREATININE 9.05* 9.36*  --  9.53* 10.60* 7.67*  CALCIUM 9.6 9.5  --  9.3 9.0 8.9  MG  --  2.1  --   --  2.2  --   PHOS  --  8.4*   --  6.5*  --  5.3*     Liver Function Tests: Recent Labs  Lab 11/03/20 2144 11/04/20 0130 11/06/20 1119 11/07/20 0116 11/09/20 1315  AST 19 18  --  21  --   ALT 7 6  --  <5  --   ALKPHOS 51 50  --  47  --   BILITOT 0.7 0.7  --  0.9  --   PROT 6.6 6.8  --  5.9*  --   ALBUMIN 3.7 3.5 3.0* 3.0* 3.3*    No results for input(s): LIPASE, AMYLASE in the last 168 hours. Recent Labs  Lab 11/04/20 0130  AMMONIA 14     CBC: Recent Labs  Lab 11/03/20 2144 11/04/20 0130 11/04/20 0143 11/06/20 1119 11/09/20 1315  WBC 7.9 9.4  --  6.4 4.9  NEUTROABS 6.3 7.5  --  4.9  --   HGB 9.3* 9.3* 9.2* 8.3* 9.4*  HCT 28.8* 29.0* 27.0* 26.0* 29.6*  MCV 96.6 97.3  --  95.6 96.7  PLT 133* 129*  --  123* 150     Cardiac Enzymes: No results for input(s): CKTOTAL, CKMB, CKMBINDEX, TROPONINI in the last 168 hours.  BNP: Invalid input(s): POCBNP  CBG: Recent Labs  Lab 11/08/20 0843  GLUCAP 112*     Microbiology: Results for orders placed or performed during  the hospital encounter of 11/03/20  Resp Panel by RT-PCR (Flu A&B, Covid) Nasopharyngeal Swab     Status: None   Collection Time: 11/04/20 12:07 AM   Specimen: Nasopharyngeal Swab; Nasopharyngeal(NP) swabs in vial transport medium  Result Value Ref Range Status   SARS Coronavirus 2 by RT PCR NEGATIVE NEGATIVE Final    Comment: (NOTE) SARS-CoV-2 target nucleic acids are NOT DETECTED.  The SARS-CoV-2 RNA is generally detectable in upper respiratory specimens during the acute phase of infection. The lowest concentration of SARS-CoV-2 viral copies this assay can detect is 138 copies/mL. A negative result does not preclude SARS-Cov-2 infection and should not be used as the sole basis for treatment or other patient management decisions. A negative result may occur with  improper specimen collection/handling, submission of specimen other than nasopharyngeal swab, presence of viral mutation(s) within the areas targeted by this  assay, and inadequate number of viral copies(<138 copies/mL). A negative result must be combined with clinical observations, patient history, and epidemiological information. The expected result is Negative.  Fact Sheet for Patients:  EntrepreneurPulse.com.au  Fact Sheet for Healthcare Providers:  IncredibleEmployment.be  This test is no t yet approved or cleared by the [place] FDA and  has been authorized for detection and/or diagnosis of SARS-CoV-2 by FDA under an Emergency Use Authorization (EUA). This EUA will remain  in effect (meaning this test can be used) for the duration of the COVID-19 declaration under Section 564(b)(1) of the Act, 21 U.S.C.section 360bbb-3(b)(1), unless the authorization is terminated  or revoked sooner.       Influenza A by PCR NEGATIVE NEGATIVE Final   Influenza B by PCR NEGATIVE NEGATIVE Final    Comment: (NOTE) The Xpert Xpress SARS-CoV-2/FLU/RSV plus assay is intended as an aid in the diagnosis of influenza from Nasopharyngeal swab specimens and should not be used as a sole basis for treatment. Nasal washings and aspirates are unacceptable for Xpert Xpress SARS-CoV-2/FLU/RSV testing.  Fact Sheet for Patients: EntrepreneurPulse.com.au  Fact Sheet for Healthcare Providers: IncredibleEmployment.be  This test is not yet approved or cleared by the [place] FDA and has been authorized for detection and/or diagnosis of SARS-CoV-2 by FDA under an Emergency Use Authorization (EUA). This EUA will remain in effect (meaning this test can be used) for the duration of the COVID-19 declaration under Section 564(b)(1) of the Act, 21 U.S.C. section 360bbb-3(b)(1), unless the authorization is terminated or revoked.  Performed at [facility], [place] [address], [place], [place] [address]   SARS CORONAVIRUS 2 (TAT 6-24 HRS) Nasopharyngeal Nasopharyngeal Swab     Status:  None   Collection Time: 11/09/20  4:35 PM   Specimen: Nasopharyngeal Swab  Result Value Ref Range Status   SARS Coronavirus 2 NEGATIVE NEGATIVE Final    Comment: (NOTE) SARS-CoV-2 target nucleic acids are NOT DETECTED.  The SARS-CoV-2 RNA is generally detectable in upper and lower respiratory specimens during the acute phase of infection. Negative results do not preclude SARS-CoV-2 infection, do not rule out co-infections with other pathogens, and should not be used as the sole basis for treatment or other patient management decisions. Negative results must be combined with clinical observations, patient history, and epidemiological information. The expected result is Negative.  Fact Sheet for Patients: SugarRoll.be  Fact Sheet for Healthcare Providers: https://www.woods-mathews.com/  This test is not yet approved or cleared by the [place] FDA and  has been authorized for detection and/or diagnosis of SARS-CoV-2 by FDA under an Emergency Use Authorization (EUA).  This EUA will remain  in effect (meaning this test can be used) for the duration of the COVID-19 declaration under Se ction 564(b)(1) of the Act, 21 U.S.C. section 360bbb-3(b)(1), unless the authorization is terminated or revoked sooner.  Performed at [facility], [place] [address] [place], [place] [address]    *Note: Due to a large number of results and/or encounters for the requested time period, some results have not been displayed. A complete set of results can be found in Results Review.    Coagulation Studies: No results for input(s): LABPROT, INR in the last 72 hours.  Urinalysis: No results for input(s): COLORURINE, LABSPEC, PHURINE, GLUCOSEU, HGBUR, BILIRUBINUR, KETONESUR, PROTEINUR, UROBILINOGEN, NITRITE, LEUKOCYTESUR in the last 72 hours.  Invalid input(s): APPERANCEUR    Imaging: No results found.   Medications:     amiodarone  200 mg Oral  Daily   amLODipine  10 mg Oral Daily   atorvastatin  80 mg Oral Q supper   Chlorhexidine Gluconate Cloth  6 each Topical Q0600   Chlorhexidine Gluconate Cloth  6 each Topical Q0600   clopidogrel  75 mg Oral Daily   heparin  5,000 Units Subcutaneous Q8H   lanthanum  2,000 mg Oral TID WC   pantoprazole  40 mg Oral Daily   acetaminophen **OR** acetaminophen, hydrALAZINE, LORazepam, ondansetron **OR** ondansetron (ZOFRAN) IV, polyethylene glycol  Assessment/ Plan:   Dialysis Orders: [facility] MWF 4hrs 400/AF 1.5 2K 2Ca Profile 4 EDW 76.5kg RU AVF Hep 2400 Venofer 124mcg x5HD Mircera 5mcg on 8/17 Calcitriol 17mcg qHD  Assessment/Plan:  Confusion/hallucinations - CT negative.  Etiology unclear.  Improved today. Debility - plan for SNF placement  ESRD -  on HD MWF.  Next dialysis treatment be 11/11/2020   BP variable. Continue home meds.  Does not appear volume overloaded.  Net UF 3L last HD. Continue UF as tolerated.   Anemia of CKD - Last Hgb 9.2. ESA due 8/31.   Secondary Hyperparathyroidism -  Ca in goal.  Phos elevated, continue binders and VDRA.  Nutrition - Renal diet w/fluid restrictions CAD/CHF/severe AS s/p AVR PVD s/p R BKA   LOS: 5 Sherril Croon @TODAY @8 :23 AM

## 2020-11-10 NOTE — Discharge Summary (Signed)
Physician Discharge Summary  Jose Garrett RWE:[id number] DOB: 18-Jul-1950 DOA: 11/03/2020  PCP: [name], NP  Admit date: 11/03/2020 Discharge date: 11/10/2020 30 Day Unplanned Readmission Risk Score    Flowsheet Row ED to Hosp-Admission (Current) from 11/03/2020 in [facility]  30 Day Unplanned Readmission Risk Score (%) 26.32 Filed at 11/10/2020 0400       This score is the patient's risk of an unplanned readmission within 30 days of being discharged (0 -100%). The score is based on dignosis, age, lab data, medications, orders, and past utilization.   Low:  0-14.9   Medium: 15-21.9   High: 22-29.9   Extreme: 30 and above          Admitted From: Home Disposition: SNF  Recommendations for Outpatient Follow-up:  Follow up with PCP in 1-2 weeks Please obtain BMP/CBC in one week Follow-up with nephrology for your routine hemodialysis. Please follow up with your PCP on the following pending results: Unresulted Labs (From admission, onward)    None         Home Health: None Equipment/Devices: None  Discharge Condition: Stable CODE STATUS: Full code Diet recommendation: Low-sodium/cardiac diet  Subjective: Seen and examined.  No complaints.  Excited about getting discharged today.  Brief/Interim Summary: 70 year old male with past medical history of ESRD hemodialysis Monday Wednesday Friday, chronic combined heart failure, severe aortic stenosis status post TAVR, hypertension, hyperlipidemia, GERD, right BKA, excisional biopsy of the left forearm lesion 8/24 brought to the ER with worsening disorientation, lethargy and hallucinations.  Lives at home with daughter.  Work-up so far negative. Patient received general anesthesia on 8/24 for cyst excision.  Probably encephalopathy due to medications, opiates and benzodiazepines.  Hemodynamically stable.  Neurologically stable.  Encephalopathy resolved.  Due to his ESRD status, nephrology was consulted and he  received his dialysis.  Rest of the medical issues remained stable.  Evaluated by PT OT and they recommended SNF.  He was also seen by Dr. Burney Gauze of orthopedics and his left wrist dressing was changed today.  Patient has been offered a bed at SNF and he is a stable so he will be discharged today.  Discharge Diagnoses:  Principal Problem:   Acute metabolic encephalopathy Active Problems:   Essential hypertension, benign   ESRD (end stage renal disease) on dialysis [facility])   Coronary artery disease involving native coronary artery of native heart without angina pectoris   GERD without esophagitis   Chronic combined systolic and diastolic CHF (congestive heart failure) (HCC)   Mixed hyperlipidemia   Altered mental status    Discharge Instructions   Allergies as of 11/10/2020   No Known Allergies      Medication List     STOP taking these medications    Oxycodone HCl 10 MG Tabs       TAKE these medications    acetaminophen 325 MG tablet Commonly known as: TYLENOL Take 1-2 tablets (325-650 mg total) by mouth every 4 (four) hours as needed for mild pain (or temp >/= 101 F).   amiodarone 200 MG tablet Commonly known as: PACERONE TAKE 1 TABLET (200 MG TOTAL) BY MOUTH DAILY.   amLODipine 10 MG tablet Commonly known as: NORVASC Take 1 tablet (10 mg total) by mouth at bedtime.   atorvastatin 80 MG tablet Commonly known as: LIPITOR TAKE 1 TABLET BY MOUTH DAILY WITH SUPPER   clopidogrel 75 MG tablet Commonly known as: PLAVIX TAKE 1 TABLET BY MOUTH DAILY WITH SUPPER What changed: Another  medication with the same name was removed. Continue taking this medication, and follow the directions you see here.   Darbepoetin Alfa 100 MCG/0.5ML Sosy injection Commonly known as: ARANESP Inject 0.5 mLs (100 mcg total) into the vein every Friday with hemodialysis.   docusate sodium 100 MG capsule Commonly known as: COLACE Take 1 capsule (100 mg total) by mouth 2 (two) times  daily. What changed:  when to take this reasons to take this   iron sucrose in sodium chloride 0.9 % 100 mL Iron Sucrose (Venofer)   lanthanum 1000 MG chewable tablet Commonly known as: FOSRENOL Chew 2,000 mg by mouth 3 (three) times daily.   LORazepam 1 MG tablet Commonly known as: ATIVAN Take 1 tablet (1 mg total) by mouth 2 (two) times daily as needed for anxiety.   MIRCERA IJ Mircera   multivitamin Tabs tablet Take 1 tablet by mouth at bedtime.   Rena-Vite Rx 1 MG Tabs Take 1 tablet by mouth once a day   ondansetron 4 MG tablet Commonly known as: ZOFRAN TAKE 1 TABLET BY MOUTH TWICE A DAY AS NEEDED   oxyCODONE-acetaminophen 5-325 MG tablet Commonly known as: Percocet Take 1 tablet by mouth every 4 (four) hours as needed for severe pain.   pantoprazole 40 MG tablet Commonly known as: PROTONIX Take 1 tablet (40 mg total) by mouth daily.   vitamin B-12 1000 MCG tablet Commonly known as: CYANOCOBALAMIN Take 1 tablet (1,000 mcg total) by mouth every evening.        Follow-up Information     [place] .   Specialty: Emergency Medicine Why: As needed, If symptoms worsen Contact information: [address] [id number] [place]        [name], NP. Call in 2 days.   Specialty: Nurse Practitioner Contact information: [place] [address] [phone]         [name], NP Follow up in 1 week(s).   Specialty: Nurse Practitioner Contact information: [place] [address] [phone]         Burnell Blanks, MD .   Specialty: Cardiology Contact information: [place] [place] [address] [phone]                No Known Allergies  Consultations: Nephrology and orthopedics   Procedures/Studies: DG Chest 1 View  Result Date: 11/04/2020 CLINICAL DATA:  Altered mental status EXAM:  CHEST  1 VIEW COMPARISON:  10/17/2020 FINDINGS: Cardiac shadow is enlarged but stable. Changes of prior TAVR are seen. Increased central vascular congestion is noted with changes of mild edema. No focal confluent infiltrate is seen. IMPRESSION: Early CHF.  No other focal abnormality is noted. Electronically Signed   By: Inez Catalina M.D.   On: 11/04/2020 01:47   CT Head Wo Contrast  Result Date: 11/03/2020 CLINICAL DATA:  Mental status changes EXAM: CT HEAD WITHOUT CONTRAST TECHNIQUE: Contiguous axial images were obtained from the base of the skull through the vertex without intravenous contrast. COMPARISON:  None. FINDINGS: Brain: No acute intracranial abnormality. Specifically, no hemorrhage, hydrocephalus, mass lesion, acute infarction, or significant intracranial injury. Vascular: No hyperdense vessel or unexpected calcification. Skull: No acute calvarial abnormality. Sinuses/Orbits: No acute findings Other: None IMPRESSION: No acute intracranial abnormality. Electronically Signed   By: Rolm Baptise M.D.   On: 11/03/2020 22:25   DG Hand 2 View Right  Result Date: 10/25/2020 CLINICAL DATA:  Right hand  cellulitis for 2-3 days. EXAM: RIGHT HAND - 2 VIEW COMPARISON:  None. FINDINGS: There is no evidence of an acute fracture or dislocation. A chronic deformity is seen involving the proximal aspect of the fourth right metacarpal. There is no evidence of arthropathy or other focal bone abnormality. Moderate severity diffuse soft tissue swelling is seen. Mild vascular calcification is also noted. Small radiopaque surgical clips are seen within the soft tissues adjacent to the volar aspect of the distal right radius. No soft tissue air is identified. IMPRESSION: 1. Diffuse soft tissue swelling which may represent diffuse cellulitis. 2. No acute osseous abnormality. Electronically Signed   By: Virgina Norfolk M.D.   On: 10/25/2020 22:50   DG CHEST PORT 1 VIEW  Result Date: 10/17/2020 CLINICAL DATA:  Follow-up  pulmonary edema. EXAM: PORTABLE CHEST 1 VIEW COMPARISON:  Chest radiograph 10/16/2020 and earlier FINDINGS: Stable cardiomediastinal silhouette. TAVR. Aortic calcifications. Interval improvement of diffuse hazy interstitial thickening compared to yesterday's radiograph. Persistent subtle retrocardiac hazy opacity. No pleural effusion or pneumothorax. Visualized upper abdomen is unremarkable. IMPRESSION: 1. Interval improvement of pulmonary edema. 2. Persistent hazy retrocardiac airspace opacity is favored to represent atelectasis, though infection could have a similar appearance. Electronically Signed   By: Ileana Roup MD   On: 10/17/2020 15:56   DG Chest Port 1 View  Result Date: 10/16/2020 CLINICAL DATA:  Shortness of breath EXAM: PORTABLE CHEST 1 VIEW COMPARISON:  05/11/2019 FINDINGS: Cardiomegaly. Prior aortic valve repair. Diffuse interstitial prominence throughout the lungs, favor edema. Small bilateral effusions. No acute bony abnormality. IMPRESSION: Cardiomegaly. Diffuse interstitial prominence throughout the lungs, favor edema/CHF. Small effusions. Electronically Signed   By: Rolm Baptise M.D.   On: 10/16/2020 20:27     Discharge Exam: Vitals:   11/09/20 2348 11/10/20 0332  BP: (!) 124/49 (!) 116/46  Pulse: 70 69  Resp: 18 17  Temp: 98.3 F (36.8 C) 98.5 F (36.9 C)  SpO2: 93% 100%   Vitals:   11/09/20 1700 11/09/20 2057 11/09/20 2348 11/10/20 0332  BP: 111/72 (!) 87/66 (!) 124/49 (!) 116/46  Pulse: 74 73 70 69  Resp: 19 18 18 17   Temp: 97.7 F (36.5 C) 98.1 F (36.7 C) 98.3 F (36.8 C) 98.5 F (36.9 C)  TempSrc:  Oral  Oral  SpO2: 97% 100% 93% 100%  Weight:        General: Pt is alert, awake, not in acute distress Cardiovascular: RRR, S1/S2 +, no rubs, no gallops Respiratory: CTA bilaterally, no wheezing, no rhonchi Abdominal: Soft, NT, ND, bowel sounds + Extremities: Right BKA    The results of significant diagnostics from this hospitalization (including imaging,  microbiology, ancillary and laboratory) are listed below for reference.     Microbiology: Recent Results (from the past 240 hour(s))  Aerobic/Anaerobic Culture w Gram Stain (surgical/deep wound)     Status: None   Collection Time: 11/02/20  2:24 PM   Specimen: Soft Tissue, Other  Result Value Ref Range Status   Specimen Description WOUND LEFT WRIST  Final   Special Requests MASS  Final   Gram Stain   Final    NO SQUAMOUS EPITHELIAL CELLS SEEN NO WBC SEEN FEW GRAM NEGATIVE RODS    Culture   Final    RARE NORMAL SKIN FLORA NO ANAEROBES ISOLATED Performed at [facility], [place] [address], [place], [place] [address]    Report Status 11/07/2020 FINAL  Final  Resp Panel by RT-PCR (Flu A&B, Covid) Nasopharyngeal Swab  Status: None   Collection Time: 11/04/20 12:07 AM   Specimen: Nasopharyngeal Swab; Nasopharyngeal(NP) swabs in vial transport medium  Result Value Ref Range Status   SARS Coronavirus 2 by RT PCR NEGATIVE NEGATIVE Final    Comment: (NOTE) SARS-CoV-2 target nucleic acids are NOT DETECTED.  The SARS-CoV-2 RNA is generally detectable in upper respiratory specimens during the acute phase of infection. The lowest concentration of SARS-CoV-2 viral copies this assay can detect is 138 copies/mL. A negative result does not preclude SARS-Cov-2 infection and should not be used as the sole basis for treatment or other patient management decisions. A negative result may occur with  improper specimen collection/handling, submission of specimen other than nasopharyngeal swab, presence of viral mutation(s) within the areas targeted by this assay, and inadequate number of viral copies(<138 copies/mL). A negative result must be combined with clinical observations, patient history, and epidemiological information. The expected result is Negative.  Fact Sheet for Patients:  EntrepreneurPulse.com.au  Fact Sheet for Healthcare Providers:   IncredibleEmployment.be  This test is no t yet approved or cleared by the [place] FDA and  has been authorized for detection and/or diagnosis of SARS-CoV-2 by FDA under an Emergency Use Authorization (EUA). This EUA will remain  in effect (meaning this test can be used) for the duration of the COVID-19 declaration under Section 564(b)(1) of the Act, 21 U.S.C.section 360bbb-3(b)(1), unless the authorization is terminated  or revoked sooner.       Influenza A by PCR NEGATIVE NEGATIVE Final   Influenza B by PCR NEGATIVE NEGATIVE Final    Comment: (NOTE) The Xpert Xpress SARS-CoV-2/FLU/RSV plus assay is intended as an aid in the diagnosis of influenza from Nasopharyngeal swab specimens and should not be used as a sole basis for treatment. Nasal washings and aspirates are unacceptable for Xpert Xpress SARS-CoV-2/FLU/RSV testing.  Fact Sheet for Patients: EntrepreneurPulse.com.au  Fact Sheet for Healthcare Providers: IncredibleEmployment.be  This test is not yet approved or cleared by the [place] FDA and has been authorized for detection and/or diagnosis of SARS-CoV-2 by FDA under an Emergency Use Authorization (EUA). This EUA will remain in effect (meaning this test can be used) for the duration of the COVID-19 declaration under Section 564(b)(1) of the Act, 21 U.S.C. section 360bbb-3(b)(1), unless the authorization is terminated or revoked.  Performed at [facility], [place] [address] [place], [place] [address]   SARS CORONAVIRUS 2 (TAT 6-24 HRS) Nasopharyngeal Nasopharyngeal Swab     Status: None   Collection Time: 11/09/20  4:35 PM   Specimen: Nasopharyngeal Swab  Result Value Ref Range Status   SARS Coronavirus 2 NEGATIVE NEGATIVE Final    Comment: (NOTE) SARS-CoV-2 target nucleic acids are NOT DETECTED.  The SARS-CoV-2 RNA is generally detectable in upper and lower respiratory specimens during the  acute phase of infection. Negative results do not preclude SARS-CoV-2 infection, do not rule out co-infections with other pathogens, and should not be used as the sole basis for treatment or other patient management decisions. Negative results must be combined with clinical observations, patient history, and epidemiological information. The expected result is Negative.  Fact Sheet for Patients: SugarRoll.be  Fact Sheet for Healthcare Providers: https://www.woods-mathews.com/  This test is not yet approved or cleared by the [place] FDA and  has been authorized for detection and/or diagnosis of SARS-CoV-2 by FDA under an Emergency Use Authorization (EUA). This EUA will remain  in effect (meaning this test can be used) for the duration of the COVID-19  declaration under Se ction 564(b)(1) of the Act, 21 U.S.C. section 360bbb-3(b)(1), unless the authorization is terminated or revoked sooner.  Performed at [facility], [place] [address] [place], [place], [place] [address]      Labs: BNP (last 3 results) Recent Labs    10/16/20 2034 11/04/20 0130  BNP 1,084.5* 397.6*   Basic Metabolic Panel: Recent Labs  Lab 11/03/20 2144 11/04/20 0130 11/04/20 0143 11/06/20 1119 11/07/20 0116 11/09/20 1315  NA 137 138 137 136 136 139  K 3.7 3.5 3.5 3.9 4.3 3.7  CL 93* 94*  --  97* 98 99  CO2 27 25  --  26 26 26   GLUCOSE 106* 102*  --  109* 102* 108*  BUN 40* 42*  --  45* 52* 38*  CREATININE 9.05* 9.36*  --  9.53* 10.60* 7.67*  CALCIUM 9.6 9.5  --  9.3 9.0 8.9  MG  --  2.1  --   --  2.2  --   PHOS  --  8.4*  --  6.5*  --  5.3*   Liver Function Tests: Recent Labs  Lab 11/03/20 2144 11/04/20 0130 11/06/20 1119 11/07/20 0116 11/09/20 1315  AST 19 18  --  21  --   ALT 7 6  --  <5  --   ALKPHOS 51 50  --  47  --   BILITOT 0.7 0.7  --  0.9  --   PROT 6.6 6.8  --  5.9*  --   ALBUMIN 3.7 3.5 3.0* 3.0* 3.3*   No results for input(s):  LIPASE, AMYLASE in the last 168 hours. Recent Labs  Lab 11/04/20 0130  AMMONIA 14   CBC: Recent Labs  Lab 11/03/20 2144 11/04/20 0130 11/04/20 0143 11/06/20 1119 11/09/20 1315  WBC 7.9 9.4  --  6.4 4.9  NEUTROABS 6.3 7.5  --  4.9  --   HGB 9.3* 9.3* 9.2* 8.3* 9.4*  HCT 28.8* 29.0* 27.0* 26.0* 29.6*  MCV 96.6 97.3  --  95.6 96.7  PLT 133* 129*  --  123* 150   Cardiac Enzymes: No results for input(s): CKTOTAL, CKMB, CKMBINDEX, TROPONINI in the last 168 hours. BNP: Invalid input(s): POCBNP CBG: Recent Labs  Lab 11/08/20 0843  GLUCAP 112*   D-Dimer No results for input(s): DDIMER in the last 72 hours. Hgb A1c No results for input(s): HGBA1C in the last 72 hours. Lipid Profile No results for input(s): CHOL, HDL, LDLCALC, TRIG, CHOLHDL, LDLDIRECT in the last 72 hours. Thyroid function studies No results for input(s): TSH, T4TOTAL, T3FREE, THYROIDAB in the last 72 hours.  Invalid input(s): FREET3 Anemia work up No results for input(s): VITAMINB12, FOLATE, FERRITIN, TIBC, IRON, RETICCTPCT in the last 72 hours. Urinalysis    Component Value Date/Time   COLORURINE AMBER (A) 02/01/2016 1707   APPEARANCEUR CLOUDY (A) 02/01/2016 1707   LABSPEC 1.020 02/01/2016 1707   PHURINE 5.5 02/01/2016 1707   GLUCOSEU 100 (A) 02/01/2016 1707   HGBUR NEGATIVE 02/01/2016 1707   BILIRUBINUR SMALL (A) 02/01/2016 1707   KETONESUR NEGATIVE 02/01/2016 1707   PROTEINUR >300 (A) 02/01/2016 1707   NITRITE NEGATIVE 02/01/2016 1707   LEUKOCYTESUR TRACE (A) 02/01/2016 1707   Sepsis Labs Invalid input(s): PROCALCITONIN,  WBC,  LACTICIDVEN Microbiology Recent Results (from the past 240 hour(s))  Aerobic/Anaerobic Culture w Gram Stain (surgical/deep wound)     Status: None   Collection Time: 11/02/20  2:24 PM   Specimen: Soft Tissue, Other  Result Value Ref Range Status  Specimen Description WOUND LEFT WRIST  Final   Special Requests MASS  Final   Gram Stain   Final    NO SQUAMOUS  EPITHELIAL CELLS SEEN NO WBC SEEN FEW GRAM NEGATIVE RODS    Culture   Final    RARE NORMAL SKIN FLORA NO ANAEROBES ISOLATED Performed at [facility], [address] [place], [place] [address]    Report Status 11/07/2020 FINAL  Final  Resp Panel by RT-PCR (Flu A&B, Covid) Nasopharyngeal Swab     Status: None   Collection Time: 11/04/20 12:07 AM   Specimen: Nasopharyngeal Swab; Nasopharyngeal(NP) swabs in vial transport medium  Result Value Ref Range Status   SARS Coronavirus 2 by RT PCR NEGATIVE NEGATIVE Final    Comment: (NOTE) SARS-CoV-2 target nucleic acids are NOT DETECTED.  The SARS-CoV-2 RNA is generally detectable in upper respiratory specimens during the acute phase of infection. The lowest concentration of SARS-CoV-2 viral copies this assay can detect is 138 copies/mL. A negative result does not preclude SARS-Cov-2 infection and should not be used as the sole basis for treatment or other patient management decisions. A negative result may occur with  improper specimen collection/handling, submission of specimen other than nasopharyngeal swab, presence of viral mutation(s) within the areas targeted by this assay, and inadequate number of viral copies(<138 copies/mL). A negative result must be combined with clinical observations, patient history, and epidemiological information. The expected result is Negative.  Fact Sheet for Patients:  EntrepreneurPulse.com.au  Fact Sheet for Healthcare Providers:  IncredibleEmployment.be  This test is no t yet approved or cleared by the [place] FDA and  has been authorized for detection and/or diagnosis of SARS-CoV-2 by FDA under an Emergency Use Authorization (EUA). This EUA will remain  in effect (meaning this test can be used) for the duration of the COVID-19 declaration under Section 564(b)(1) of the Act, 21 U.S.C.section 360bbb-3(b)(1), unless the authorization is terminated  or  revoked sooner.       Influenza A by PCR NEGATIVE NEGATIVE Final   Influenza B by PCR NEGATIVE NEGATIVE Final    Comment: (NOTE) The Xpert Xpress SARS-CoV-2/FLU/RSV plus assay is intended as an aid in the diagnosis of influenza from Nasopharyngeal swab specimens and should not be used as a sole basis for treatment. Nasal washings and aspirates are unacceptable for Xpert Xpress SARS-CoV-2/FLU/RSV testing.  Fact Sheet for Patients: EntrepreneurPulse.com.au  Fact Sheet for Healthcare Providers: IncredibleEmployment.be  This test is not yet approved or cleared by the [place] FDA and has been authorized for detection and/or diagnosis of SARS-CoV-2 by FDA under an Emergency Use Authorization (EUA). This EUA will remain in effect (meaning this test can be used) for the duration of the COVID-19 declaration under Section 564(b)(1) of the Act, 21 U.S.C. section 360bbb-3(b)(1), unless the authorization is terminated or revoked.  Performed at [facility], [place] [address] [place], [place] [address]   SARS CORONAVIRUS 2 (TAT 6-24 HRS) Nasopharyngeal Nasopharyngeal Swab     Status: None   Collection Time: 11/09/20  4:35 PM   Specimen: Nasopharyngeal Swab  Result Value Ref Range Status   SARS Coronavirus 2 NEGATIVE NEGATIVE Final    Comment: (NOTE) SARS-CoV-2 target nucleic acids are NOT DETECTED.  The SARS-CoV-2 RNA is generally detectable in upper and lower respiratory specimens during the acute phase of infection. Negative results do not preclude SARS-CoV-2 infection, do not rule out co-infections with other pathogens, and should not be used as the sole basis for  treatment or other patient management decisions. Negative results must be combined with clinical observations, patient history, and epidemiological information. The expected result is Negative.  Fact Sheet for Patients: SugarRoll.be  Fact Sheet  for Healthcare Providers: https://www.woods-mathews.com/  This test is not yet approved or cleared by the [place] FDA and  has been authorized for detection and/or diagnosis of SARS-CoV-2 by FDA under an Emergency Use Authorization (EUA). This EUA will remain  in effect (meaning this test can be used) for the duration of the COVID-19 declaration under Se ction 564(b)(1) of the Act, 21 U.S.C. section 360bbb-3(b)(1), unless the authorization is terminated or revoked sooner.  Performed at [facility], [place] [address], [place], [place] [address]      Time coordinating discharge: Over 30 minutes  SIGNED:   Darliss Cheney, MD  [facility] 11/10/2020, 7:48 AM  If 7PM-7AM, please contact night-coverage [email or web address]

## 2020-11-10 NOTE — Progress Notes (Signed)
Patient seen and examined at bedside this morning.  Left wrist dressing change.  Both incisions clean and dry.  No signs of infection.  We redressed this morning.  Will need to see my office next week for suture removal.

## 2020-11-10 NOTE — Progress Notes (Signed)
Pt's case discussed with SW, Kathlee Nations. New snf placement has not been located. D/C delayed due to new snf placement.   Pt is active at [facility] on a M/W/F schedule at 11:30 prior to admission. Kidney center notified that pt's d/c has been delayed and pt will not be at appointment tomorrow. SW to notify renal navigator once snf placement is confirmed.   Melven Sartorius [phone]

## 2020-11-10 NOTE — TOC Progression Note (Signed)
Transition of Care [facility]) - Progression Note    Patient Details  Name: Jose Garrett MRN: [id number] Date of Birth: [birth year]  [facility] CM/SW [place], [place] Phone Number: 11/10/2020, 4:10 PM  Clinical Narrative:   CSW following for SNF placement. Per [facility], they are requesting additional PT note, as patient deferred ambulation last time. CSW to obtain updated PT note tomorrow, as patient is gone to HD. CSW to follow.    Expected Discharge Plan: Skilled Nursing Facility Barriers to Discharge: SNF Pending bed offer, Continued Medical Work up  Expected Discharge Plan and Services Expected Discharge Plan: [place] Choice: [place] arrangements for the past 2 months: Single Family Home Expected Discharge Date: 11/10/20                                     Social Determinants of Health (SDOH) Interventions    Readmission Risk Interventions Readmission Risk Prevention Plan 04/15/2019 10/23/2018 08/20/2018  Transportation Screening Complete Complete -  PCP or Specialist Appt within 3-5 Days - Complete -  HRI or [place] - Complete -  Social Work Consult for [place] Planning/Counseling - Complete -  Palliative Care Screening - Not Applicable -  Medication Review Press photographer) Complete Complete -  PCP or Specialist appointment within 3-5 days of discharge Complete - Complete  HRI or Home Care Consult Complete - Complete  SW Recovery Care/Counseling Consult Complete - Complete  Palliative Care Screening Not Applicable - Not [place] Patient Refused - Not Applicable  Some recent data might be hidden

## 2020-11-10 NOTE — TOC Progression Note (Signed)
Transition of Care [facility]) - Progression Note    Patient Details  Name: Jose Garrett MRN: [id number] Date of Birth: 17-Oct-1950  Transition of Care [facility]) CM/SW [place], [place] Phone Number: 11/10/2020, 4:11 PM  Clinical Narrative:   CSW following for discharge to SNF. CSW sent in updated PT note and authorization was obtained for patient to admit to SNF, but [facility] is now unable to offer a bed as they do not have staffing for new admits. CSW reached out to [facility] about admission and transfer to [facility] when able, but [facility] is unable to offer as they are at capacity for dialysis patients. CSW attempted to reach daughter to discuss alternative SNF choices, left a voicemail and awaiting call back. CSW to follow.    Expected Discharge Plan: Skilled Nursing Facility Barriers to Discharge: SNF Pending bed offer, Continued Medical Work up  Expected Discharge Plan and Services Expected Discharge Plan: [place] Choice: [place] arrangements for the past 2 months: Single Family Home Expected Discharge Date: 11/10/20                                     Social Determinants of Health (SDOH) Interventions    Readmission Risk Interventions Readmission Risk Prevention Plan 04/15/2019 10/23/2018 08/20/2018  Transportation Screening Complete Complete -  PCP or Specialist Appt within 3-5 Days - Complete -  HRI or [place] - Complete -  Social Work Consult for [place] Planning/Counseling - Complete -  Palliative Care Screening - Not Applicable -  Medication Review Press photographer) Complete Complete -  PCP or Specialist appointment within 3-5 days of discharge Complete - Complete  HRI or Home Care Consult Complete - Complete  SW Recovery Care/Counseling Consult Complete - Complete  Palliative Care Screening Not Applicable - Not [place] Patient Refused - Not Applicable   Some recent data might be hidden

## 2020-11-10 NOTE — Progress Notes (Signed)
Pt refused SQ heparin this shift. Pt thimks med may have made him sick on his stomach. Dr. Marlowe Sax informed.

## 2020-11-10 NOTE — Progress Notes (Signed)
Physical Therapy Treatment Patient Details Name: Jose Garrett MRN: [id number] DOB: 01/31/1951 Today's Date: 11/10/2020    History of Present Illness 70 y/o male presented to ED on 8/25 for confusion and hallucinations. Patient recently with cyst removal on L hand on 8/24. Suspect due to disequilibrium syndrome. PMH: HTN, ESRD on dialysis MWF, CAD, GERD, CHF, severe aortic stenosis s/p TAVR (10/2018), HLD, R BKA 04/2019.    PT Comments    Pt admitted with above diagnosis. Pt was able to laterally scoot to the drop arm recliner with min assist and cues.  Pt progressing slowly.  Needs SNF for therapy.  Pt currently with functional limitations due to balance and endurance deficits. Pt will benefit from skilled PT to increase their independence and safety with mobility to allow discharge to the venue listed below.      Follow Up Recommendations  SNF;Supervision/Assistance - 24 hour     Equipment Recommendations  None recommended by PT    Recommendations for Other Services       Precautions / Restrictions Precautions Precautions: Fall Precaution Comments: recent L hand sx with soft dressing to elbow Restrictions Other Position/Activity Restrictions: (Per pt report he is to avoid lifting and avoid WBing through Lt wrist)    Mobility  Bed Mobility               General bed mobility comments: EOB on arrival. Donned prosthesis independently.    Transfers Overall transfer level: Needs assistance   Transfers: Lateral/Scoot Transfers;Squat Pivot Transfers Sit to Stand: Mod assist   Squat pivot transfers: Mod assist    Lateral/Scoot Transfers: Min assist General transfer comment: Attempted squat pivot but unable to pivot without sufficient UE support.  Pt performed lateral scoot transfer to drop arm recliner, able to scoot to left, mostly using RUE and LLE for scooting (not using LUE due to recent surgery)  Ambulation/Gait             General Gait Details: deferred, pt did  not want to attempt ambulation   Stairs             Wheelchair Mobility    Modified Rankin (Stroke Patients Only)       Balance Overall balance assessment: Needs assistance Sitting-balance support: No upper extremity supported;Feet unsupported Sitting balance-Leahy Scale: Fair Sitting balance - Comments: sitting EOB with Supervision at most, able to weight shift L/R and tap elbow with fair balance   Standing balance support: Bilateral upper extremity supported Standing balance-Leahy Scale: Poor Standing balance comment: mod assisst to maintain partial stand                            Cognition Arousal/Alertness: Awake/alert Behavior During Therapy: WFL for tasks assessed/performed Overall Cognitive Status: Within Functional Limits for tasks assessed Area of Impairment: Orientation;Attention;Memory;Following commands;Safety/judgement;Awareness;Problem solving                 Orientation Level: Disoriented to;Time Current Attention Level: Sustained Memory: Decreased short-term memory Following Commands: Follows one step commands with increased time Safety/Judgement: Decreased awareness of deficits;Decreased awareness of safety Awareness: Emergent Problem Solving: Slow processing;Difficulty sequencing;Requires verbal cues General Comments: overall WFL for simple mobility tasks      Exercises Other Exercises Other Exercises: seated BLE AROM: knee extension, hip flexion, ankle pumps (LLE)    General Comments General comments (skin integrity, edema, etc.): VSS      Pertinent Vitals/Pain Pain Assessment: Faces Faces Pain Scale: Hurts a  little bit Pain Location: L hand post-surgical and L low back pain Pain Descriptors / Indicators: Discomfort Pain Intervention(s): Limited activity within patient's tolerance;Monitored during session;Repositioned    Home Living                      Prior Function            PT Goals (current goals  can now be found in the care plan section) Acute Rehab PT Goals Patient Stated Goal: to go to rehab Progress towards PT goals: Progressing toward goals    Frequency    Min 2X/week      PT Plan Current plan remains appropriate    Co-evaluation              AM-PAC PT "6 Clicks" Mobility   Outcome Measure  Help needed turning from your back to your side while in a flat bed without using bedrails?: A Little Help needed moving from lying on your back to sitting on the side of a flat bed without using bedrails?: A Little Help needed moving to and from a bed to a chair (including a wheelchair)?: A Little Help needed standing up from a chair using your arms (e.g., wheelchair or bedside chair)?: A Lot Help needed to walk in hospital room?: Total Help needed climbing 3-5 steps with a railing? : Total 6 Click Score: 13    End of Session Equipment Utilized During Treatment: Gait belt Activity Tolerance: Patient tolerated treatment well Patient left: in chair;with call bell/phone within reach;with chair alarm set Nurse Communication: Mobility status PT Visit Diagnosis: Unsteadiness on feet (R26.81);Muscle weakness (generalized) (M62.81);Difficulty in walking, not elsewhere classified (R26.2)     Time: 8250-0370 PT Time Calculation (min) (ACUTE ONLY): 10 min  Charges:  $Therapeutic Activity: 8-22 mins                     [name] M,PT Acute Rehab Services [phone] (pager)    [name] 11/10/2020, 12:11 PM

## 2020-11-11 LAB — RENAL FUNCTION PANEL
Albumin: 3.2 g/dL — ABNORMAL LOW (ref 3.5–5.0)
Anion gap: 14 (ref 5–15)
BUN: 41 mg/dL — ABNORMAL HIGH (ref 8–23)
CO2: 25 mmol/L (ref 22–32)
Calcium: 9.3 mg/dL (ref 8.9–10.3)
Chloride: 96 mmol/L — ABNORMAL LOW (ref 98–111)
Creatinine, Ser: 8.81 mg/dL — ABNORMAL HIGH (ref 0.61–1.24)
GFR, Estimated: 6 mL/min — ABNORMAL LOW (ref >60)
Glucose, Bld: 99 mg/dL (ref 70–99)
Phosphorus: 6.7 mg/dL — ABNORMAL HIGH (ref 2.5–4.6)
Potassium: 3.7 mmol/L (ref 3.5–5.1)
Sodium: 135 mmol/L (ref 135–145)

## 2020-11-11 LAB — CBC
HCT: 28.6 % — ABNORMAL LOW (ref 39.0–52.0)
Hemoglobin: 9.3 g/dL — ABNORMAL LOW (ref 13.0–17.0)
MCH: 30.8 pg (ref 26.0–34.0)
MCHC: 32.5 g/dL (ref 30.0–36.0)
MCV: 94.7 fL (ref 80.0–100.0)
Platelets: 128 10*3/uL — ABNORMAL LOW (ref 150–400)
RBC: 3.02 MIL/uL — ABNORMAL LOW (ref 4.22–5.81)
RDW: 15.7 % — ABNORMAL HIGH (ref 11.5–15.5)
WBC: 5.4 10*3/uL (ref 4.0–10.5)
nRBC: 0 % (ref 0.0–0.2)

## 2020-11-11 MED ORDER — HEPARIN SODIUM (PORCINE) 1000 UNIT/ML DIALYSIS: 1000.0000 [IU] | INTRAMUSCULAR | Status: DC | PRN

## 2020-11-11 MED ORDER — LIDOCAINE-PRILOCAINE 2.5-2.5 % EX CREA: 1.0000 "application " | TOPICAL_CREAM | CUTANEOUS | Status: DC | PRN

## 2020-11-11 MED ORDER — ALTEPLASE 2 MG IJ SOLR: 2.0000 mg | Freq: Once | INTRAMUSCULAR | Status: DC | PRN

## 2020-11-11 MED ORDER — SODIUM CHLORIDE 0.9 % IV SOLN: 100.0000 mL | INTRAVENOUS | Status: DC | PRN

## 2020-11-11 MED ORDER — PENTAFLUOROPROP-TETRAFLUOROETH EX AERO: 1.0000 "application " | INHALATION_SPRAY | CUTANEOUS | Status: DC | PRN

## 2020-11-11 MED ORDER — LIDOCAINE HCL (PF) 1 % IJ SOLN: 5.0000 mL | INTRAMUSCULAR | Status: DC | PRN

## 2020-11-11 NOTE — Progress Notes (Signed)
Occupational Therapy Treatment Patient Details Name: Jose Garrett MRN: [id number] DOB: Feb 03, 1951 Today's Date: 11/11/2020    History of present illness 70 y/o male presented to ED on 8/25 for confusion and hallucinations. Patient recently with cyst removal on L hand on 8/24. Suspect due to disequilibrium syndrome. PMH: HTN, ESRD on dialysis MWF, CAD, GERD, CHF, severe aortic stenosis s/p TAVR (10/2018), HLD, R BKA 04/2019.   OT comments  Pt progressing towards acute OT goals. Decreased problem solving and impaired safety awareness noted today. Impaired balance and decreased activity tolerance remain. Upon arrival of OT pt was walking out of the bathroom with waistband of his pants down to just above his knees. OT offered assistance to completely don pants before mobilizing for safety. Pt declined offer stating the pants were wet and he needed to change. OT walked with pt back to recliner and provided setup for pericare and LB dressing change which pt completed in lateral lean positions. Of note, pt reporting diarrhea and queasy stomach since HD earlier.  D/c plan remains appropriate.    Follow Up Recommendations  SNF    Equipment Recommendations  None recommended by OT    Recommendations for Other Services      Precautions / Restrictions Precautions Precautions: Fall Precaution Comments: recent L hand sx with soft dressing to elbow Restrictions Other Position/Activity Restrictions: Per pt report he is to avoid lifting and avoid WBing through Lt wrist. Pt reports he was told by doctor to "just be careful" with using a standard walker       Mobility Bed Mobility               General bed mobility comments: not observed this session    Transfers Overall transfer level: Needs assistance Equipment used: Rolling walker (2 wheeled) Transfers: Sit to/from Stand Sit to Stand: Min assist         General transfer comment: assist to steady and control descent into recliner.     Balance Overall balance assessment: Needs assistance Sitting-balance support: No upper extremity supported;Feet unsupported Sitting balance-Leahy Scale: Fair     Standing balance support: Bilateral upper extremity supported Standing balance-Leahy Scale: Poor Standing balance comment: mod assisst to maintain partial stand                           ADL either performed or assessed with clinical judgement   ADL Overall ADL's : Needs assistance/impaired                         Toilet Transfer: Minimal assistance;Ambulation;RW;Comfort height toilet;Grab bars Toilet Transfer Details (indicate cue type and reason): RLE prosthtic on Toileting- Clothing Manipulation and Hygiene: Sitting/lateral lean;Moderate assistance       Functional mobility during ADLs: Minimal assistance;Rolling walker General ADL Comments: Decreased activity tolerance and impaired balance also with decreased safety awareness and problem solving. Pt finished walking bathroom distance with pt, cues for safety. Pt then completed UB/LB dressing in lateral lean position.     Vision       Perception     Praxis      Cognition Arousal/Alertness: Awake/alert Behavior During Therapy: Flat affect;WFL for tasks assessed/performed Overall Cognitive Status: No family/caregiver present to determine baseline cognitive functioning                                 General Comments:  Decreased problem solving and safety awareness. Upon arrival of OT pt walking out of bathroom with waistband of pants lowered to just above the knees. OT offered to assist pt with fully donning pants before continuing to walk with pt responding that he didn't want to do that because the pants were wet.        Exercises     Shoulder Instructions       General Comments Pt reports diarhea and queasy stomach since HD today.    Pertinent Vitals/ Pain       Pain Assessment: 0-10 Pain Score: 0-No pain Pain  Location: "no pain, just quesy" Pain Intervention(s): Monitored during session  Home Living                                          Prior Functioning/Environment              Frequency  Min 2X/week        Progress Toward Goals  OT Goals(current goals can now be found in the care plan section)  Progress towards OT goals: Progressing toward goals  Acute Rehab OT Goals Patient Stated Goal: to go to rehab OT Goal Formulation: With patient Time For Goal Achievement: 11/18/20 Potential to Achieve Goals: Good ADL Goals Pt Will Perform Eating: with set-up;with supervision;sitting Pt Will Perform Grooming: with set-up;with supervision;sitting Pt Will Perform Upper Body Bathing: with set-up;with supervision;sitting Pt Will Transfer to Toilet: bedside commode;with min assist  Plan Discharge plan remains appropriate;Frequency remains appropriate    Co-evaluation                 AM-PAC OT "6 Clicks" Daily Activity     Outcome Measure   Help from another person eating meals?: A Lot Help from another person taking care of personal grooming?: A Little Help from another person toileting, which includes using toliet, bedpan, or urinal?: A Lot Help from another person bathing (including washing, rinsing, drying)?: A Lot Help from another person to put on and taking off regular upper body clothing?: A Lot Help from another person to put on and taking off regular lower body clothing?: A Lot 6 Click Score: 13    End of Session Equipment Utilized During Treatment: Rolling walker;Other (comment) (RLE prosthtic)  OT Visit Diagnosis: Unsteadiness on feet (R26.81);Cognitive communication deficit (R41.841)   Activity Tolerance Patient tolerated treatment well   Patient Left in chair;with call bell/phone within reach;with chair alarm set   Nurse Communication Mobility status;Other (comment) (cognition)        Time: 5809-9833 OT Time Calculation (min): 18  min  Charges: OT General Charges $OT Visit: 1 Visit OT Treatments $Self Care/Home Management : 8-22 mins  Jose Garrett, OT Acute Rehabilitation Services Pager: [phone] Office: [phone]    Hortencia Pilar 11/11/2020, 1:28 PM

## 2020-11-11 NOTE — Progress Notes (Signed)
[facility] ROUNDING NOTE   Subjective:   Interval History: 70 year old end-stage renal disease Monday Wednesday Friday dialysis.  Admitted with altered mental status increased lethargy and hallucinations.  Confusion thought to be secondary to metabolic encephalopathy and hallucinations secondary to opiates and anesthesia use.  Patient has undergone a recent left dorsal/palmar wrist mass excision 11/02/2020.  He was using OxyContin for pain control.   Last dialysis treatment 11/09/2020 with 1 L removed.  Next dialysis treatment will be 11/11/2020  Blood pressure 112 47 pulse 65 temperature 98.1 O2 sats 97% room air  Sodium 135 potassium 3.7 chloride 96 CO2 25 BUN 41 creatinine 8.8 glucose 99 phosphorus 6.7 calcium 9.3  Objective:  Vital signs in last 24 hours:  Temp:  [98.1 F (36.7 C)-98.2 F (36.8 C)] 98.1 F (36.7 C) (09/02 0345) Pulse Rate:  [64-70] 66 (09/02 0345) Resp:  [18] 18 (09/02 0345) BP: (103-159)/(47-146) 112/47 (09/02 0345) SpO2:  [90 %-100 %] 97 % (09/02 0345)  Weight change:  Filed Weights   11/07/20 1731 11/09/20 1235 11/09/20 1600  Weight: 70.3 kg 70.4 kg 69.5 kg    Intake/Output: No intake/output data recorded.   Intake/Output this shift:  No intake/output data recorded.  General:chronically ill appearing male in NAD Heart:RRR, no mrg Lungs:CTAB, nml WOB on RA Abdomen:soft, NTND Extremities:R BKA, no edema b/l Dialysis Access: RU AVF +b/t    Basic Metabolic Panel: Recent Labs  Lab 11/06/20 1119 11/07/20 0116 11/09/20 1315 11/11/20 0720  NA 136 136 139 135  K 3.9 4.3 3.7 3.7  CL 97* 98 99 96*  CO2 26 26 26 25   GLUCOSE 109* 102* 108* 99  BUN 45* 52* 38* 41*  CREATININE 9.53* 10.60* 7.67* 8.81*  CALCIUM 9.3 9.0 8.9 9.3  MG  --  2.2  --   --   PHOS 6.5*  --  5.3* 6.7*     Liver Function Tests: Recent Labs  Lab 11/06/20 1119 11/07/20 0116 11/09/20 1315 11/11/20 0720  AST  --  21  --   --   ALT  --  <5  --   --   ALKPHOS   --  47  --   --   BILITOT  --  0.9  --   --   PROT  --  5.9*  --   --   ALBUMIN 3.0* 3.0* 3.3* 3.2*    No results for input(s): LIPASE, AMYLASE in the last 168 hours. No results for input(s): AMMONIA in the last 168 hours.   CBC: Recent Labs  Lab 11/06/20 1119 11/09/20 1315 11/11/20 0720  WBC 6.4 4.9 5.4  NEUTROABS 4.9  --   --   HGB 8.3* 9.4* 9.3*  HCT 26.0* 29.6* 28.6*  MCV 95.6 96.7 94.7  PLT 123* 150 128*     Cardiac Enzymes: No results for input(s): CKTOTAL, CKMB, CKMBINDEX, TROPONINI in the last 168 hours.  BNP: Invalid input(s): POCBNP  CBG: Recent Labs  Lab 11/08/20 0843  GLUCAP 112*     Microbiology: Results for orders placed or performed during the hospital encounter of 11/03/20  Resp Panel by RT-PCR (Flu A&B, Covid) Nasopharyngeal Swab     Status: None   Collection Time: 11/04/20 12:07 AM   Specimen: Nasopharyngeal Swab; Nasopharyngeal(NP) swabs in vial transport medium  Result Value Ref Range Status   SARS Coronavirus 2 by RT PCR NEGATIVE NEGATIVE Final    Comment: (NOTE) SARS-CoV-2 target nucleic acids are NOT DETECTED.  The SARS-CoV-2 RNA is generally  detectable in upper respiratory specimens during the acute phase of infection. The lowest concentration of SARS-CoV-2 viral copies this assay can detect is 138 copies/mL. A negative result does not preclude SARS-Cov-2 infection and should not be used as the sole basis for treatment or other patient management decisions. A negative result may occur with  improper specimen collection/handling, submission of specimen other than nasopharyngeal swab, presence of viral mutation(s) within the areas targeted by this assay, and inadequate number of viral copies(<138 copies/mL). A negative result must be combined with clinical observations, patient history, and epidemiological information. The expected result is Negative.  Fact Sheet for Patients:  EntrepreneurPulse.com.au  Fact Sheet  for Healthcare Providers:  IncredibleEmployment.be  This test is no t yet approved or cleared by the [place] FDA and  has been authorized for detection and/or diagnosis of SARS-CoV-2 by FDA under an Emergency Use Authorization (EUA). This EUA will remain  in effect (meaning this test can be used) for the duration of the COVID-19 declaration under Section 564(b)(1) of the Act, 21 U.S.C.section 360bbb-3(b)(1), unless the authorization is terminated  or revoked sooner.       Influenza A by PCR NEGATIVE NEGATIVE Final   Influenza B by PCR NEGATIVE NEGATIVE Final    Comment: (NOTE) The Xpert Xpress SARS-CoV-2/FLU/RSV plus assay is intended as an aid in the diagnosis of influenza from Nasopharyngeal swab specimens and should not be used as a sole basis for treatment. Nasal washings and aspirates are unacceptable for Xpert Xpress SARS-CoV-2/FLU/RSV testing.  Fact Sheet for Patients: EntrepreneurPulse.com.au  Fact Sheet for Healthcare Providers: IncredibleEmployment.be  This test is not yet approved or cleared by the [place] FDA and has been authorized for detection and/or diagnosis of SARS-CoV-2 by FDA under an Emergency Use Authorization (EUA). This EUA will remain in effect (meaning this test can be used) for the duration of the COVID-19 declaration under Section 564(b)(1) of the Act, 21 U.S.C. section 360bbb-3(b)(1), unless the authorization is terminated or revoked.  Performed at [facility], [place] [address] [place], [place] [address]   SARS CORONAVIRUS 2 (TAT 6-24 HRS) Nasopharyngeal Nasopharyngeal Swab     Status: None   Collection Time: 11/09/20  4:35 PM   Specimen: Nasopharyngeal Swab  Result Value Ref Range Status   SARS Coronavirus 2 NEGATIVE NEGATIVE Final    Comment: (NOTE) SARS-CoV-2 target nucleic acids are NOT DETECTED.  The SARS-CoV-2 RNA is generally detectable in upper and  lower respiratory specimens during the acute phase of infection. Negative results do not preclude SARS-CoV-2 infection, do not rule out co-infections with other pathogens, and should not be used as the sole basis for treatment or other patient management decisions. Negative results must be combined with clinical observations, patient history, and epidemiological information. The expected result is Negative.  Fact Sheet for Patients: SugarRoll.be  Fact Sheet for Healthcare Providers: https://www.woods-mathews.com/  This test is not yet approved or cleared by the [place] FDA and  has been authorized for detection and/or diagnosis of SARS-CoV-2 by FDA under an Emergency Use Authorization (EUA). This EUA will remain  in effect (meaning this test can be used) for the duration of the COVID-19 declaration under Se ction 564(b)(1) of the Act, 21 U.S.C. section 360bbb-3(b)(1), unless the authorization is terminated or revoked sooner.  Performed at [facility], [place] [address], [place], [place] [address]    *Note: Due to a large number of results and/or encounters for the requested time period, some results have  not been displayed. A complete set of results can be found in Results Review.    Coagulation Studies: No results for input(s): LABPROT, INR in the last 72 hours.  Urinalysis: No results for input(s): COLORURINE, LABSPEC, PHURINE, GLUCOSEU, HGBUR, BILIRUBINUR, KETONESUR, PROTEINUR, UROBILINOGEN, NITRITE, LEUKOCYTESUR in the last 72 hours.  Invalid input(s): APPERANCEUR    Imaging: No results found.   Medications:    sodium chloride     sodium chloride      amiodarone  200 mg Oral Daily   amLODipine  10 mg Oral Daily   atorvastatin  80 mg Oral Q supper   Chlorhexidine Gluconate Cloth  6 each Topical Q0600   Chlorhexidine Gluconate Cloth  6 each Topical Q0600   clopidogrel  75 mg Oral Daily   heparin  5,000 Units  Subcutaneous Q8H   lanthanum  2,000 mg Oral TID WC   pantoprazole  40 mg Oral Daily   sodium chloride, sodium chloride, acetaminophen **OR** acetaminophen, alteplase, heparin, hydrALAZINE, lidocaine (PF), lidocaine-prilocaine, LORazepam, ondansetron **OR** ondansetron (ZOFRAN) IV, pentafluoroprop-tetrafluoroeth, polyethylene glycol  Assessment/ Plan:   Dialysis Orders: [facility] MWF 4hrs 400/AF 1.5 2K 2Ca Profile 4 EDW 76.5kg RU AVF Hep 2400 Venofer 144mcg x5HD Mircera 84mcg on 8/17 Calcitriol 77mcg qHD  Assessment/Plan:  Confusion/hallucinations - CT negative.  Etiology unclear.  Improved today. Debility - plan for SNF placement  ESRD -  on HD MWF.  Next dialysis treatment be 11/11/2020   BP variable. Continue home meds.  Does not appear volume overloaded.  Net UF 3L last HD. Continue UF as tolerated.   Anemia of CKD - Last Hgb 9.2. ESA due 8/31.   Secondary Hyperparathyroidism -  Ca in goal.  Phos elevated, continue binders and VDRA.  Nutrition - Renal diet w/fluid restrictions CAD/CHF/severe AS s/p AVR PVD s/p R BKA   LOS: 6 Sherril Croon @TODAY @8 :12 AM

## 2020-11-11 NOTE — Progress Notes (Signed)
PROGRESS NOTE    Jose Garrett  HLK:[id number] DOB: [birth year] DOA: 11/03/2020 PCP: [name], NP    Brief Narrative:  ESRD hemodialysis Monday Wednesday Friday, chronic combined heart failure, severe aortic stenosis status post TAVR, hypertension, hyperlipidemia, GERD, right BKA, excisional biopsy of the left forearm lesion 8/24 brought to the ER with worsening disorientation, lethargy and hallucinations.  Lives at home with daughter.  Work-up so far negative. Patient received general anesthesia on 8/24 for cyst excision.  Probably encephalopathy due to medications, opiates and benzodiazepines.  Hemodynamically stable.  Neurologically stable.  Encephalopathy resolved.  Due to his ESRD status, nephrology was consulted and he received his dialysis.  Rest of the medical issues remained stable.  Evaluated by PT OT and they recommended SNF.   Assessment & Plan:   Principal Problem:   Acute metabolic encephalopathy Active Problems:   Essential hypertension, benign   ESRD (end stage renal disease) on dialysis (HCC)   Coronary artery disease involving native coronary artery of native heart without angina pectoris   GERD without esophagitis   Chronic combined systolic and diastolic CHF (congestive heart failure) (HCC)   Mixed hyperlipidemia   Altered mental status  Acute metabolic encephalopathy: Probably due to general anesthesia in a patient with hemodialysis.  Fully alert and oriented.  Continue low-dose of benzodiazepine.    ESRD on hemodialysis: Continue dialysis per schedule by nephrology  Extreme debility, right BKA status and now with left hand surgery: PT OT on board they recommended SNF for rehab.  TOC working on placement  Essential hypertension: Blood pressure stable.  Continue amlodipine.  GERD without esophagitis: Continue Protonix.  Chronic combined heart failure: Currently euvolemic.  Managed with dialysis.  Hyperlipidemia: Continue statin Patient is medically stable  for discharge and in fact he was discharged yesterday however per TOC, [facility] was unable to offer a bed as they did not have the staffing to accept new admits and there was no other SNF bed offer.  TOC working with family to find beds at other facilities.  DVT prophylaxis: heparin injection 5,000 Units Start: 11/04/20 1400   Code Status: Full code Family Communication: None at bedside. Disposition Plan: SNF.  Status is: Inpatient  Remains inpatient appropriate because:Unsafe d/c plan  Dispo: The patient is from: Home              Anticipated d/c is to: SNF              Patient currently is medically stable to d/c.   Difficult to place patient No        Consultants:  Nephrology  Procedures:  Routine hemodialysis 8/26  Antimicrobials:  None   Subjective: Seen and examined in dialysis unit.  Patient has no complaints.  Objective: Vitals:   11/11/20 0930 11/11/20 1000 11/11/20 1030 11/11/20 1100  BP: 114/64 138/73 (!) 81/71 102/67  Pulse: (!) 50 71 (!) 57 72  Resp:      Temp:      TempSrc:      SpO2:      Weight:        Intake/Output Summary (Last 24 hours) at 11/11/2020 1316 Last data filed at 11/11/2020 1121 Gross per 24 hour  Intake --  Output 927 ml  Net -927 ml    Filed Weights   11/09/20 1235 11/09/20 1600 11/11/20 0805  Weight: 70.4 kg 69.5 kg 69 kg    Examination:  General exam: Appears calm and comfortable  Respiratory system: Clear to auscultation. Respiratory effort  normal. Cardiovascular system: S1 & S2 heard, RRR. No JVD, murmurs, rubs, gallops or clicks. No pedal edema. Gastrointestinal system: Abdomen is nondistended, soft and nontender. No organomegaly or masses felt. Normal bowel sounds heard. Central nervous system: Alert and oriented. No focal neurological deficits. Extremities: Right BKA Skin: No rashes, lesions or ulcers.  Psychiatry: Judgement and insight appear normal. Mood & affect appropriate.    Data Reviewed: I have  personally reviewed following labs and imaging studies  CBC: Recent Labs  Lab 11/06/20 1119 11/09/20 1315 11/11/20 0720  WBC 6.4 4.9 5.4  NEUTROABS 4.9  --   --   HGB 8.3* 9.4* 9.3*  HCT 26.0* 29.6* 28.6*  MCV 95.6 96.7 94.7  PLT 123* 150 128*    Basic Metabolic Panel: Recent Labs  Lab 11/06/20 1119 11/07/20 0116 11/09/20 1315 11/11/20 0720  NA 136 136 139 135  K 3.9 4.3 3.7 3.7  CL 97* 98 99 96*  CO2 26 26 26 25   GLUCOSE 109* 102* 108* 99  BUN 45* 52* 38* 41*  CREATININE 9.53* 10.60* 7.67* 8.81*  CALCIUM 9.3 9.0 8.9 9.3  MG  --  2.2  --   --   PHOS 6.5*  --  5.3* 6.7*    GFR: Estimated Creatinine Clearance: 7.8 mL/min (A) (by C-G formula based on SCr of 8.81 mg/dL (H)). Liver Function Tests: Recent Labs  Lab 11/06/20 1119 11/07/20 0116 11/09/20 1315 11/11/20 0720  AST  --  21  --   --   ALT  --  <5  --   --   ALKPHOS  --  47  --   --   BILITOT  --  0.9  --   --   PROT  --  5.9*  --   --   ALBUMIN 3.0* 3.0* 3.3* 3.2*    No results for input(s): LIPASE, AMYLASE in the last 168 hours. No results for input(s): AMMONIA in the last 168 hours.  Coagulation Profile: No results for input(s): INR, PROTIME in the last 168 hours. Cardiac Enzymes: No results for input(s): CKTOTAL, CKMB, CKMBINDEX, TROPONINI in the last 168 hours. BNP (last 3 results) No results for input(s): PROBNP in the last 8760 hours. HbA1C: No results for input(s): HGBA1C in the last 72 hours. CBG: Recent Labs  Lab 11/08/20 0843  GLUCAP 112*    Lipid Profile: No results for input(s): CHOL, HDL, LDLCALC, TRIG, CHOLHDL, LDLDIRECT in the last 72 hours. Thyroid Function Tests: No results for input(s): TSH, T4TOTAL, FREET4, T3FREE, THYROIDAB in the last 72 hours.  Anemia Panel: No results for input(s): VITAMINB12, FOLATE, FERRITIN, TIBC, IRON, RETICCTPCT in the last 72 hours.  Sepsis Labs: Recent Labs  Lab 11/04/20 1430  PROCALCITON 0.20     Recent Results (from the past 240  hour(s))  Aerobic/Anaerobic Culture w Gram Stain (surgical/deep wound)     Status: None   Collection Time: 11/02/20  2:24 PM   Specimen: Soft Tissue, Other  Result Value Ref Range Status   Specimen Description WOUND LEFT WRIST  Final   Special Requests MASS  Final   Gram Stain   Final    NO SQUAMOUS EPITHELIAL CELLS SEEN NO WBC SEEN FEW GRAM NEGATIVE RODS    Culture   Final    RARE NORMAL SKIN FLORA NO ANAEROBES ISOLATED Performed at [facility], [place] [address], [place], [place] [address]    Report Status 11/07/2020 FINAL  Final  Resp Panel by RT-PCR (Flu A&B, Covid) Nasopharyngeal  Swab     Status: None   Collection Time: 11/04/20 12:07 AM   Specimen: Nasopharyngeal Swab; Nasopharyngeal(NP) swabs in vial transport medium  Result Value Ref Range Status   SARS Coronavirus 2 by RT PCR NEGATIVE NEGATIVE Final    Comment: (NOTE) SARS-CoV-2 target nucleic acids are NOT DETECTED.  The SARS-CoV-2 RNA is generally detectable in upper respiratory specimens during the acute phase of infection. The lowest concentration of SARS-CoV-2 viral copies this assay can detect is 138 copies/mL. A negative result does not preclude SARS-Cov-2 infection and should not be used as the sole basis for treatment or other patient management decisions. A negative result may occur with  improper specimen collection/handling, submission of specimen other than nasopharyngeal swab, presence of viral mutation(s) within the areas targeted by this assay, and inadequate number of viral copies(<138 copies/mL). A negative result must be combined with clinical observations, patient history, and epidemiological information. The expected result is Negative.  Fact Sheet for Patients:  EntrepreneurPulse.com.au  Fact Sheet for Healthcare Providers:  IncredibleEmployment.be  This test is no t yet approved or cleared by the [place] FDA and  has been authorized for  detection and/or diagnosis of SARS-CoV-2 by FDA under an Emergency Use Authorization (EUA). This EUA will remain  in effect (meaning this test can be used) for the duration of the COVID-19 declaration under Section 564(b)(1) of the Act, 21 U.S.C.section 360bbb-3(b)(1), unless the authorization is terminated  or revoked sooner.       Influenza A by PCR NEGATIVE NEGATIVE Final   Influenza B by PCR NEGATIVE NEGATIVE Final    Comment: (NOTE) The Xpert Xpress SARS-CoV-2/FLU/RSV plus assay is intended as an aid in the diagnosis of influenza from Nasopharyngeal swab specimens and should not be used as a sole basis for treatment. Nasal washings and aspirates are unacceptable for Xpert Xpress SARS-CoV-2/FLU/RSV testing.  Fact Sheet for Patients: EntrepreneurPulse.com.au  Fact Sheet for Healthcare Providers: IncredibleEmployment.be  This test is not yet approved or cleared by the [place] FDA and has been authorized for detection and/or diagnosis of SARS-CoV-2 by FDA under an Emergency Use Authorization (EUA). This EUA will remain in effect (meaning this test can be used) for the duration of the COVID-19 declaration under Section 564(b)(1) of the Act, 21 U.S.C. section 360bbb-3(b)(1), unless the authorization is terminated or revoked.  Performed at [facility], [place] [address] [place], [place] [address]   SARS CORONAVIRUS 2 (TAT 6-24 HRS) Nasopharyngeal Nasopharyngeal Swab     Status: None   Collection Time: 11/09/20  4:35 PM   Specimen: Nasopharyngeal Swab  Result Value Ref Range Status   SARS Coronavirus 2 NEGATIVE NEGATIVE Final    Comment: (NOTE) SARS-CoV-2 target nucleic acids are NOT DETECTED.  The SARS-CoV-2 RNA is generally detectable in upper and lower respiratory specimens during the acute phase of infection. Negative results do not preclude SARS-CoV-2 infection, do not rule out co-infections with other pathogens, and should  not be used as the sole basis for treatment or other patient management decisions. Negative results must be combined with clinical observations, patient history, and epidemiological information. The expected result is Negative.  Fact Sheet for Patients: SugarRoll.be  Fact Sheet for Healthcare Providers: https://www.woods-mathews.com/  This test is not yet approved or cleared by the [place] FDA and  has been authorized for detection and/or diagnosis of SARS-CoV-2 by FDA under an Emergency Use Authorization (EUA). This EUA will remain  in effect (meaning this test can be used) for  the duration of the COVID-19 declaration under Se ction 564(b)(1) of the Act, 21 U.S.C. section 360bbb-3(b)(1), unless the authorization is terminated or revoked sooner.  Performed at [facility], [place] [address] [place], [place] [address]      Radiology Studies: No results found.  Scheduled Meds:  amiodarone  200 mg Oral Daily   amLODipine  10 mg Oral Daily   atorvastatin  80 mg Oral Q supper   Chlorhexidine Gluconate Cloth  6 each Topical Q0600   Chlorhexidine Gluconate Cloth  6 each Topical Q0600   clopidogrel  75 mg Oral Daily   heparin  5,000 Units Subcutaneous Q8H   lanthanum  2,000 mg Oral TID WC   pantoprazole  40 mg Oral Daily   Continuous Infusions:   LOS: 6 days   Time spent: 25 minutes  Darliss Cheney, MD [facility]

## 2020-11-11 NOTE — TOC Progression Note (Signed)
Transition of Care [facility]) - Progression Note    Patient Details  Name: Jose Garrett MRN: [id number] Date of Birth: 07/26/1950  Transition of Care [facility]) CM/SW [place], [place] Phone Number: 11/11/2020, 4:33 PM  Clinical Narrative:   CSW alerted that patient's daughter, [name], had lost her phone and patient requested that [place] contacted his ex-wife, [name]. CSW spoke with [name] and explained barriers to placement at [facility]. Linda asked about CIR, and CSW discussed with CIR Admissions. Patient is not qualifying for CIR at this time, insurance will not cover. CSW received a call back from patient's daughter, [name]. CSW confirmed with patient that [name] could be called, and CSW spoke with [place]. [name] indicated that the other option the family would like to pursue would be [facility]. CSW confirmed bed availability and transport to HD with [facility], but they are unable to admit patient until Monday due to staffing. CSW sent in authorization request through [facility]. CSW to continue to follow.    Expected Discharge Plan: Skilled Nursing Facility Barriers to Discharge: SNF Pending bed offer, Continued Medical Work up  Expected Discharge Plan and Services Expected Discharge Plan: [place] Choice: [place] arrangements for the past 2 months: Single Family Home Expected Discharge Date: 11/10/20                                     Social Determinants of Health (SDOH) Interventions    Readmission Risk Interventions Readmission Risk Prevention Plan 04/15/2019 10/23/2018 08/20/2018  Transportation Screening Complete Complete -  PCP or Specialist Appt within 3-5 Days - Complete -  HRI or [place] - Complete -  Social Work Consult for [place] Planning/Counseling - Complete -  Palliative Care Screening - Not Applicable -  Medication Review [employer] photographer) Complete Complete -  PCP or Specialist  appointment within 3-5 days of discharge Complete - Complete  HRI or Home Care Consult Complete - Complete  SW Recovery Care/Counseling Consult Complete - Complete  Palliative Care Screening Not Applicable - Not [place] Patient Refused - Not Applicable  Some recent data might be hidden

## 2020-11-12 LAB — BASIC METABOLIC PANEL
Anion gap: 12 (ref 5–15)
BUN: 25 mg/dL — ABNORMAL HIGH (ref 8–23)
CO2: 24 mmol/L (ref 22–32)
Calcium: 9.7 mg/dL (ref 8.9–10.3)
Chloride: 98 mmol/L (ref 98–111)
Creatinine, Ser: 6.62 mg/dL — ABNORMAL HIGH (ref 0.61–1.24)
GFR, Estimated: 8 mL/min — ABNORMAL LOW (ref >60)
Glucose, Bld: 149 mg/dL — ABNORMAL HIGH (ref 70–99)
Potassium: 3.6 mmol/L (ref 3.5–5.1)
Sodium: 134 mmol/L — ABNORMAL LOW (ref 135–145)

## 2020-11-12 LAB — MAGNESIUM: Magnesium: 2 mg/dL (ref 1.7–2.4)

## 2020-11-12 NOTE — Progress Notes (Signed)
PROGRESS NOTE    Jose Garrett  UUV:[id number] DOB: 30-Sep-1950 DOA: 11/03/2020 PCP: [name], NP    Brief Narrative:  ESRD hemodialysis Monday Wednesday Friday, chronic combined heart failure, severe aortic stenosis status post TAVR, hypertension, hyperlipidemia, GERD, right BKA, excisional biopsy of the left forearm lesion 8/24 brought to the ER with worsening disorientation, lethargy and hallucinations.  Lives at home with daughter.  Work-up so far negative. Patient received general anesthesia on 8/24 for cyst excision.  Probably encephalopathy due to medications, opiates and benzodiazepines.  Hemodynamically stable.  Neurologically stable.  Encephalopathy resolved.  Due to his ESRD status, nephrology was consulted and he received his dialysis.  Rest of the medical issues remained stable.  Evaluated by PT OT and they recommended SNF.   Assessment & Plan:   Principal Problem:   Acute metabolic encephalopathy Active Problems:   Essential hypertension, benign   ESRD (end stage renal disease) on dialysis (HCC)   Coronary artery disease involving native coronary artery of native heart without angina pectoris   GERD without esophagitis   Chronic combined systolic and diastolic CHF (congestive heart failure) (HCC)   Mixed hyperlipidemia   Altered mental status  Acute metabolic encephalopathy: Probably due to general anesthesia in a patient with hemodialysis.  Fully alert and oriented.  Continue low-dose of benzodiazepine.    ESRD on hemodialysis: Continue dialysis per schedule by nephrology  Extreme debility, right BKA status and now with left hand surgery: PT OT on board they recommended SNF for rehab.  TOC working on placement  Essential hypertension: Blood pressure stable.  Continue amlodipine.  GERD without esophagitis: Continue Protonix.  Chronic combined heart failure: Currently euvolemic.  Managed with dialysis.  Prolonged QTC: Slightly worse today.  No chest pain.  Will  check electrolytes and magnesium.  Discontinue Zofran and omeprazole.  Monitor on telemetry.  Hyperlipidemia: Continue statin  Patient is medically stable for discharge and in fact he was discharged on 11/10/2020 however per TOC, [facility] was unable to offer a bed as they did not have the staffing to accept new admits and there was no other SNF bed offer.  Per TOC note on 11/11/2020, patient has been accepted at [facility] however they do not have staffing to admit until Monday  DVT prophylaxis: heparin injection 5,000 Units Start: 11/04/20 1400   Code Status: Full code Family Communication: None at bedside. Disposition Plan: SNF.  Status is: Inpatient  Remains inpatient appropriate because:Unsafe d/c plan  Dispo: The patient is from: Home              Anticipated d/c is to: SNF              Patient currently is medically stable to d/c.   Difficult to place patient No        Consultants:  Nephrology  Procedures:  Routine hemodialysis 8/26  Antimicrobials:  None   Subjective: Patient seen and examined.  He has no complaints.  Objective: Vitals:   11/11/20 2356 11/12/20 0315 11/12/20 0733 11/12/20 0931  BP: 105/70 114/78 (!) 124/53 122/66  Pulse: 71 69 64 72  Resp: 17 18 18 18   Temp: 98.4 F (36.9 C) 98.3 F (36.8 C) 98.5 F (36.9 C)   TempSrc: Oral Oral    SpO2: 97% 97% 98%   Weight:        Intake/Output Summary (Last 24 hours) at 11/12/2020 1053 Last data filed at 11/11/2020 1121 Gross per 24 hour  Intake --  Output 927 ml  Net -927 ml    Filed Weights   11/09/20 1600 11/11/20 0805 11/11/20 1154  Weight: 69.5 kg 69 kg 68 kg    Examination:  General exam: Appears calm and comfortable  Respiratory system: Clear to auscultation. Respiratory effort normal. Cardiovascular system: S1 & S2 heard, RRR. No JVD, murmurs, rubs, gallops or clicks. No pedal edema. Gastrointestinal system: Abdomen is nondistended, soft and nontender. No organomegaly or masses felt.  Normal bowel sounds heard. Central nervous system: Alert and oriented. No focal neurological deficits. Extremities: Right BKA Skin: No rashes, lesions or ulcers.  Psychiatry: Judgement and insight appear normal. Mood & affect appropriate.    Data Reviewed: I have personally reviewed following labs and imaging studies  CBC: Recent Labs  Lab 11/06/20 1119 11/09/20 1315 11/11/20 0720  WBC 6.4 4.9 5.4  NEUTROABS 4.9  --   --   HGB 8.3* 9.4* 9.3*  HCT 26.0* 29.6* 28.6*  MCV 95.6 96.7 94.7  PLT 123* 150 128*    Basic Metabolic Panel: Recent Labs  Lab 11/06/20 1119 11/07/20 0116 11/09/20 1315 11/11/20 0720  NA 136 136 139 135  K 3.9 4.3 3.7 3.7  CL 97* 98 99 96*  CO2 26 26 26 25   GLUCOSE 109* 102* 108* 99  BUN 45* 52* 38* 41*  CREATININE 9.53* 10.60* 7.67* 8.81*  CALCIUM 9.3 9.0 8.9 9.3  MG  --  2.2  --   --   PHOS 6.5*  --  5.3* 6.7*    GFR: Estimated Creatinine Clearance: 7.7 mL/min (A) (by C-G formula based on SCr of 8.81 mg/dL (H)). Liver Function Tests: Recent Labs  Lab 11/06/20 1119 11/07/20 0116 11/09/20 1315 11/11/20 0720  AST  --  21  --   --   ALT  --  <5  --   --   ALKPHOS  --  47  --   --   BILITOT  --  0.9  --   --   PROT  --  5.9*  --   --   ALBUMIN 3.0* 3.0* 3.3* 3.2*    No results for input(s): LIPASE, AMYLASE in the last 168 hours. No results for input(s): AMMONIA in the last 168 hours.  Coagulation Profile: No results for input(s): INR, PROTIME in the last 168 hours. Cardiac Enzymes: No results for input(s): CKTOTAL, CKMB, CKMBINDEX, TROPONINI in the last 168 hours. BNP (last 3 results) No results for input(s): PROBNP in the last 8760 hours. HbA1C: No results for input(s): HGBA1C in the last 72 hours. CBG: Recent Labs  Lab 11/08/20 0843  GLUCAP 112*    Lipid Profile: No results for input(s): CHOL, HDL, LDLCALC, TRIG, CHOLHDL, LDLDIRECT in the last 72 hours. Thyroid Function Tests: No results for input(s): TSH, T4TOTAL, FREET4,  T3FREE, THYROIDAB in the last 72 hours.  Anemia Panel: No results for input(s): VITAMINB12, FOLATE, FERRITIN, TIBC, IRON, RETICCTPCT in the last 72 hours.  Sepsis Labs: No results for input(s): PROCALCITON, LATICACIDVEN in the last 168 hours.   Recent Results (from the past 240 hour(s))  Aerobic/Anaerobic Culture w Gram Stain (surgical/deep wound)     Status: None   Collection Time: 11/02/20  2:24 PM   Specimen: Soft Tissue, Other  Result Value Ref Range Status   Specimen Description WOUND LEFT WRIST  Final   Special Requests MASS  Final   Gram Stain   Final    NO SQUAMOUS EPITHELIAL CELLS SEEN NO WBC SEEN FEW GRAM NEGATIVE RODS    Culture  Final    RARE NORMAL SKIN FLORA NO ANAEROBES ISOLATED Performed at [facility], [place] [address] [place], [name] [address]    Report Status 11/07/2020 FINAL  Final  Resp Panel by RT-PCR (Flu A&B, Covid) Nasopharyngeal Swab     Status: None   Collection Time: 11/04/20 12:07 AM   Specimen: Nasopharyngeal Swab; Nasopharyngeal(NP) swabs in vial transport medium  Result Value Ref Range Status   SARS Coronavirus 2 by RT PCR NEGATIVE NEGATIVE Final    Comment: (NOTE) SARS-CoV-2 target nucleic acids are NOT DETECTED.  The SARS-CoV-2 RNA is generally detectable in upper respiratory specimens during the acute phase of infection. The lowest concentration of SARS-CoV-2 viral copies this assay can detect is 138 copies/mL. A negative result does not preclude SARS-Cov-2 infection and should not be used as the sole basis for treatment or other patient management decisions. A negative result may occur with  improper specimen collection/handling, submission of specimen other than nasopharyngeal swab, presence of viral mutation(s) within the areas targeted by this assay, and inadequate number of viral copies(<138 copies/mL). A negative result must be combined with clinical observations, patient history, and epidemiological information. The  expected result is Negative.  Fact Sheet for Patients:  EntrepreneurPulse.com.au  Fact Sheet for Healthcare Providers:  IncredibleEmployment.be  This test is no t yet approved or cleared by the [place] FDA and  has been authorized for detection and/or diagnosis of SARS-CoV-2 by FDA under an Emergency Use Authorization (EUA). This EUA will remain  in effect (meaning this test can be used) for the duration of the COVID-19 declaration under Section 564(b)(1) of the Act, 21 U.S.C.section 360bbb-3(b)(1), unless the authorization is terminated  or revoked sooner.       Influenza A by PCR NEGATIVE NEGATIVE Final   Influenza B by PCR NEGATIVE NEGATIVE Final    Comment: (NOTE) The Xpert Xpress SARS-CoV-2/FLU/RSV plus assay is intended as an aid in the diagnosis of influenza from Nasopharyngeal swab specimens and should not be used as a sole basis for treatment. Nasal washings and aspirates are unacceptable for Xpert Xpress SARS-CoV-2/FLU/RSV testing.  Fact Sheet for Patients: EntrepreneurPulse.com.au  Fact Sheet for Healthcare Providers: IncredibleEmployment.be  This test is not yet approved or cleared by the [place] FDA and has been authorized for detection and/or diagnosis of SARS-CoV-2 by FDA under an Emergency Use Authorization (EUA). This EUA will remain in effect (meaning this test can be used) for the duration of the COVID-19 declaration under Section 564(b)(1) of the Act, 21 U.S.C. section 360bbb-3(b)(1), unless the authorization is terminated or revoked.  Performed at [facility], [place] [address], [place], [place] [address]   SARS CORONAVIRUS 2 (TAT 6-24 HRS) Nasopharyngeal Nasopharyngeal Swab     Status: None   Collection Time: 11/09/20  4:35 PM   Specimen: Nasopharyngeal Swab  Result Value Ref Range Status   SARS Coronavirus 2 NEGATIVE NEGATIVE Final    Comment:  (NOTE) SARS-CoV-2 target nucleic acids are NOT DETECTED.  The SARS-CoV-2 RNA is generally detectable in upper and lower respiratory specimens during the acute phase of infection. Negative results do not preclude SARS-CoV-2 infection, do not rule out co-infections with other pathogens, and should not be used as the sole basis for treatment or other patient management decisions. Negative results must be combined with clinical observations, patient history, and epidemiological information. The expected result is Negative.  Fact Sheet for Patients: SugarRoll.be  Fact Sheet for Healthcare Providers: https://www.woods-mathews.com/  This test is not  yet approved or cleared by the [place] and  has been authorized for detection and/or diagnosis of SARS-CoV-2 by FDA under an Emergency Use Authorization (EUA). This EUA will remain  in effect (meaning this test can be used) for the duration of the COVID-19 declaration under Se ction 564(b)(1) of the Act, 21 U.S.C. section 360bbb-3(b)(1), unless the authorization is terminated or revoked sooner.  Performed at [facility], [place] [address], [place], [place] [address]      Radiology Studies: No results found.  Scheduled Meds:  amiodarone  200 mg Oral Daily   amLODipine  10 mg Oral Daily   atorvastatin  80 mg Oral Q supper   Chlorhexidine Gluconate Cloth  6 each Topical Q0600   Chlorhexidine Gluconate Cloth  6 each Topical Q0600   clopidogrel  75 mg Oral Daily   heparin  5,000 Units Subcutaneous Q8H   lanthanum  2,000 mg Oral TID WC   Continuous Infusions:   LOS: 7 days   Time spent: 26 minutes  Darliss Cheney, MD [facility]

## 2020-11-12 NOTE — Progress Notes (Signed)
[facility] ROUNDING NOTE   Subjective:   Interval History: 70 year old end-stage renal disease Monday Wednesday Friday dialysis.  Admitted with altered mental status increased lethargy and hallucinations.  Confusion thought to be secondary to metabolic encephalopathy and hallucinations secondary to opiates and anesthesia use.  Patient has undergone a recent left dorsal/palmar wrist mass excision 11/02/2020.  He was using OxyContin for pain control.   Last dialysis treatment 11/11/2020 with 1 L removed.  Next dialysis be 11/14/2020  Blood pressure 124/53 pulse 87 temperature 98 O2 sats 98%  Sodium 135 potassium 3.7 chloride 96 CO2 25 BUN 41 creatinine 8.8 glucose 99 phosphorus 6.7 calcium 9.3.  Hemoglobin 9.3  Objective:  Vital signs in last 24 hours:  Temp:  [97.9 F (36.6 C)-98.5 F (36.9 C)] 98.5 F (36.9 C) (09/03 0733) Pulse Rate:  [50-77] 64 (09/03 0733) Resp:  [14-18] 18 (09/03 0733) BP: (81-150)/(38-134) 124/53 (09/03 0733) SpO2:  [97 %-98 %] 98 % (09/03 0733) Weight:  [68 kg] 68 kg (09/02 1154)  Weight change:  Filed Weights   11/09/20 1600 11/11/20 0805 11/11/20 1154  Weight: 69.5 kg 69 kg 68 kg    Intake/Output: I/O last 3 completed shifts: In: -  Out: 927 [Other:927]   Intake/Output this shift:  No intake/output data recorded.  General:chronically ill appearing male in NAD Heart:RRR, no mrg Lungs:CTAB, nml WOB on RA Abdomen:soft, NTND Extremities:R BKA, no edema b/l Dialysis Access: RU AVF +b/t    Basic Metabolic Panel: Recent Labs  Lab 11/06/20 1119 11/07/20 0116 11/09/20 1315 11/11/20 0720  NA 136 136 139 135  K 3.9 4.3 3.7 3.7  CL 97* 98 99 96*  CO2 26 26 26 25   GLUCOSE 109* 102* 108* 99  BUN 45* 52* 38* 41*  CREATININE 9.53* 10.60* 7.67* 8.81*  CALCIUM 9.3 9.0 8.9 9.3  MG  --  2.2  --   --   PHOS 6.5*  --  5.3* 6.7*     Liver Function Tests: Recent Labs  Lab 11/06/20 1119 11/07/20 0116 11/09/20 1315 11/11/20 0720  AST   --  21  --   --   ALT  --  <5  --   --   ALKPHOS  --  47  --   --   BILITOT  --  0.9  --   --   PROT  --  5.9*  --   --   ALBUMIN 3.0* 3.0* 3.3* 3.2*    No results for input(s): LIPASE, AMYLASE in the last 168 hours. No results for input(s): AMMONIA in the last 168 hours.   CBC: Recent Labs  Lab 11/06/20 1119 11/09/20 1315 11/11/20 0720  WBC 6.4 4.9 5.4  NEUTROABS 4.9  --   --   HGB 8.3* 9.4* 9.3*  HCT 26.0* 29.6* 28.6*  MCV 95.6 96.7 94.7  PLT 123* 150 128*     Cardiac Enzymes: No results for input(s): CKTOTAL, CKMB, CKMBINDEX, TROPONINI in the last 168 hours.  BNP: Invalid input(s): POCBNP  CBG: Recent Labs  Lab 11/08/20 0843  GLUCAP 112*     Microbiology: Results for orders placed or performed during the hospital encounter of 11/03/20  Resp Panel by RT-PCR (Flu A&B, Covid) Nasopharyngeal Swab     Status: None   Collection Time: 11/04/20 12:07 AM   Specimen: Nasopharyngeal Swab; Nasopharyngeal(NP) swabs in vial transport medium  Result Value Ref Range Status   SARS Coronavirus 2 by RT PCR NEGATIVE NEGATIVE Final    Comment: (NOTE)  SARS-CoV-2 target nucleic acids are NOT DETECTED.  The SARS-CoV-2 RNA is generally detectable in upper respiratory specimens during the acute phase of infection. The lowest concentration of SARS-CoV-2 viral copies this assay can detect is 138 copies/mL. A negative result does not preclude SARS-Cov-2 infection and should not be used as the sole basis for treatment or other patient management decisions. A negative result may occur with  improper specimen collection/handling, submission of specimen other than nasopharyngeal swab, presence of viral mutation(s) within the areas targeted by this assay, and inadequate number of viral copies(<138 copies/mL). A negative result must be combined with clinical observations, patient history, and epidemiological information. The expected result is Negative.  Fact Sheet for Patients:   EntrepreneurPulse.com.au  Fact Sheet for Healthcare Providers:  IncredibleEmployment.be  This test is no t yet approved or cleared by the [place] FDA and  has been authorized for detection and/or diagnosis of SARS-CoV-2 by FDA under an Emergency Use Authorization (EUA). This EUA will remain  in effect (meaning this test can be used) for the duration of the COVID-19 declaration under Section 564(b)(1) of the Act, 21 U.S.C.section 360bbb-3(b)(1), unless the authorization is terminated  or revoked sooner.       Influenza A by PCR NEGATIVE NEGATIVE Final   Influenza B by PCR NEGATIVE NEGATIVE Final    Comment: (NOTE) The Xpert Xpress SARS-CoV-2/FLU/RSV plus assay is intended as an aid in the diagnosis of influenza from Nasopharyngeal swab specimens and should not be used as a sole basis for treatment. Nasal washings and aspirates are unacceptable for Xpert Xpress SARS-CoV-2/FLU/RSV testing.  Fact Sheet for Patients: EntrepreneurPulse.com.au  Fact Sheet for Healthcare Providers: IncredibleEmployment.be  This test is not yet approved or cleared by the [place] FDA and has been authorized for detection and/or diagnosis of SARS-CoV-2 by FDA under an Emergency Use Authorization (EUA). This EUA will remain in effect (meaning this test can be used) for the duration of the COVID-19 declaration under Section 564(b)(1) of the Act, 21 U.S.C. section 360bbb-3(b)(1), unless the authorization is terminated or revoked.  Performed at [facility], [place] [address] [place], [place] [address]   SARS CORONAVIRUS 2 (TAT 6-24 HRS) Nasopharyngeal Nasopharyngeal Swab     Status: None   Collection Time: 11/09/20  4:35 PM   Specimen: Nasopharyngeal Swab  Result Value Ref Range Status   SARS Coronavirus 2 NEGATIVE NEGATIVE Final    Comment: (NOTE) SARS-CoV-2 target nucleic acids are NOT DETECTED.  The  SARS-CoV-2 RNA is generally detectable in upper and lower respiratory specimens during the acute phase of infection. Negative results do not preclude SARS-CoV-2 infection, do not rule out co-infections with other pathogens, and should not be used as the sole basis for treatment or other patient management decisions. Negative results must be combined with clinical observations, patient history, and epidemiological information. The expected result is Negative.  Fact Sheet for Patients: SugarRoll.be  Fact Sheet for Healthcare Providers: https://www.woods-mathews.com/  This test is not yet approved or cleared by the [place] FDA and  has been authorized for detection and/or diagnosis of SARS-CoV-2 by FDA under an Emergency Use Authorization (EUA). This EUA will remain  in effect (meaning this test can be used) for the duration of the COVID-19 declaration under Se ction 564(b)(1) of the Act, 21 U.S.C. section 360bbb-3(b)(1), unless the authorization is terminated or revoked sooner.  Performed at [facility], [place] [address], [place], [place] [address]    *Note: Due to a large  number of results and/or encounters for the requested time period, some results have not been displayed. A complete set of results can be found in Results Review.    Coagulation Studies: No results for input(s): LABPROT, INR in the last 72 hours.  Urinalysis: No results for input(s): COLORURINE, LABSPEC, PHURINE, GLUCOSEU, HGBUR, BILIRUBINUR, KETONESUR, PROTEINUR, UROBILINOGEN, NITRITE, LEUKOCYTESUR in the last 72 hours.  Invalid input(s): APPERANCEUR    Imaging: No results found.   Medications:      amiodarone  200 mg Oral Daily   amLODipine  10 mg Oral Daily   atorvastatin  80 mg Oral Q supper   Chlorhexidine Gluconate Cloth  6 each Topical Q0600   Chlorhexidine Gluconate Cloth  6 each Topical Q0600   clopidogrel  75 mg Oral Daily   heparin   5,000 Units Subcutaneous Q8H   lanthanum  2,000 mg Oral TID WC   pantoprazole  40 mg Oral Daily   acetaminophen **OR** acetaminophen, hydrALAZINE, LORazepam, ondansetron **OR** ondansetron (ZOFRAN) IV, polyethylene glycol  Assessment/ Plan:   Dialysis Orders: [facility] MWF 4hrs 400/AF 1.5 2K 2Ca Profile 4 EDW 76.5kg RU AVF Hep 2400 Venofer 143mcg x5HD Mircera 85mcg on 8/17 Calcitriol 54mcg qHD  Assessment/Plan:  Confusion/hallucinations - CT negative.  Etiology unclear.  Improved today. Debility - plan for SNF placement  ESRD -  on HD MWF.  Next dialysis treatment 11/14/2020 BP variable. Continue home meds.  Does not appear volume overloaded.   Continue UF as tolerated.   Anemia of CKD - Last Hgb 9.2. ESA due 8/31.   Secondary Hyperparathyroidism -  Ca in goal.  Phos elevated, continue binders and VDRA.  Nutrition - Renal diet w/fluid restrictions CAD/CHF/severe AS s/p AVR PVD s/p R BKA   LOS: 7 Sherril Croon @TODAY @9 :06 AM

## 2020-11-13 MED ORDER — CHLORHEXIDINE GLUCONATE CLOTH 2 % EX PADS
6.0000 | MEDICATED_PAD | Freq: Every day | CUTANEOUS | Status: DC
  Administered 2020-11-13 – 2020-11-14 (×2): 6 via TOPICAL

## 2020-11-13 NOTE — TOC Progression Note (Signed)
Transition of Care [facility]) - Progression Note    Patient Details  Name: Jose Garrett MRN: [id number] Date of Birth: [birth year]  Transition of [facility] CM/SW Contact  Ina Homes, [place] Phone Number: 11/13/2020, 2:29 PM  Clinical Narrative:     Pt approved by [facility] [phone]). 9/2 to 9/6 Plan AYOK#[id number] Candace Cruise #[id number]  Expected Discharge Plan: [place] Barriers to Discharge: SNF Pending bed offer, Continued Medical Work up  Expected Discharge Plan and Services Expected Discharge Plan: [place] Choice: [place] arrangements for the past 2 months: Single Family Home Expected Discharge Date: 11/10/20                                     Social Determinants of Health (SDOH) Interventions    Readmission Risk Interventions Readmission Risk Prevention Plan 04/15/2019 10/23/2018 08/20/2018  Transportation Screening Complete Complete -  PCP or Specialist Appt within 3-5 Days - Complete -  HRI or [place] - Complete -  Social Work Consult for [place] Planning/Counseling - Complete -  Palliative Care Screening - Not Applicable -  Medication Review Press photographer) Complete Complete -  PCP or Specialist appointment within 3-5 days of discharge Complete - Complete  HRI or Home Care Consult Complete - Complete  SW Recovery Care/Counseling Consult Complete - Complete  Palliative Care Screening Not Applicable - Not [place] Patient Refused - Not Applicable  Some recent data might be hidden

## 2020-11-13 NOTE — Progress Notes (Signed)
PROGRESS NOTE    Jose Garrett  JIR:[id number] DOB: 1950/08/16 DOA: 11/03/2020 PCP: [name], NP    Brief Narrative:  ESRD hemodialysis Monday Wednesday Friday, chronic combined heart failure, severe aortic stenosis status post TAVR, hypertension, hyperlipidemia, GERD, right BKA, excisional biopsy of the left forearm lesion 8/24 brought to the ER with worsening disorientation, lethargy and hallucinations.  Lives at home with daughter.  Work-up so far negative. Patient received general anesthesia on 8/24 for cyst excision.  Probably encephalopathy due to medications, opiates and benzodiazepines.  Hemodynamically stable.  Neurologically stable.  Encephalopathy resolved.  Due to his ESRD status, nephrology was consulted and he received his dialysis.  Rest of the medical issues remained stable.  Evaluated by PT OT and they recommended SNF.   Assessment & Plan:   Principal Problem:   Acute metabolic encephalopathy Active Problems:   Essential hypertension, benign   ESRD (end stage renal disease) on dialysis (HCC)   Coronary artery disease involving native coronary artery of native heart without angina pectoris   GERD without esophagitis   Chronic combined systolic and diastolic CHF (congestive heart failure) (HCC)   Mixed hyperlipidemia   Altered mental status  Acute metabolic encephalopathy: Probably due to general anesthesia in a patient with hemodialysis.  Fully alert and oriented.  Continue low-dose of benzodiazepine.    ESRD on hemodialysis: Continue dialysis per schedule by nephrology  Extreme debility, right BKA status and now with left hand surgery: PT OT on board they recommended SNF for rehab.  TOC working on placement  Essential hypertension: Blood pressure stable.  Continue amlodipine.  GERD without esophagitis: Continue Protonix.  Chronic combined heart failure: Currently euvolemic.  Managed with dialysis.  Prolonged QTC: Monitor closely.  Avoid QTC prolonging  medications.  Repeat EKG in couple of days if he remains inpatient.  Hyperlipidemia: Continue statin  Patient is medically stable for discharge and in fact he was discharged on 11/10/2020 however per TOC, [facility] was unable to offer a bed as they did not have the staffing to accept new admits and there was no other SNF bed offer.  Per TOC note on 11/11/2020, patient has been accepted at [facility] however they do not have staffing to admit until Monday  DVT prophylaxis: heparin injection 5,000 Units Start: 11/04/20 1400   Code Status: Full code Family Communication: None at bedside. Disposition Plan: SNF.  Status is: Inpatient  Remains inpatient appropriate because:Unsafe d/c plan  Dispo: The patient is from: Home              Anticipated d/c is to: SNF              Patient currently is medically stable to d/c.   Difficult to place patient No        Consultants:  Nephrology  Procedures:  Routine hemodialysis 8/26  Antimicrobials:  None   Subjective: Seen and examined.  No complaints.  Just tired of being in the hospital.  Objective: Vitals:   11/12/20 2051 11/13/20 0013 11/13/20 0412 11/13/20 0941  BP: (!) 137/56 (!) 89/69 (!) 123/53 (!) 119/97  Pulse: 71 68 66 72  Resp: 16 17 16 17   Temp: 98 F (36.7 C) 98.2 F (36.8 C) 98.6 F (37 C)   TempSrc: Oral Oral Oral   SpO2: 100% 95% 100% 100%  Weight:       No intake or output data in the 24 hours ending 11/13/20 1137  Filed Weights   11/09/20 1600 11/11/20 0805 11/11/20 1154  Weight: 69.5 kg 69 kg 68 kg    Examination:  General exam: Appears calm and comfortable  Respiratory system: Clear to auscultation. Respiratory effort normal. Cardiovascular system: S1 & S2 heard, RRR. No JVD, murmurs, rubs, gallops or clicks. No pedal edema. Gastrointestinal system: Abdomen is nondistended, soft and nontender. No organomegaly or masses felt. Normal bowel sounds heard. Central nervous system: Alert and oriented. No focal  neurological deficits. Extremities: Right BKA Skin: No rashes, lesions or ulcers.  Psychiatry: Judgement and insight appear normal. Mood & affect appropriate.    Data Reviewed: I have personally reviewed following labs and imaging studies  CBC: Recent Labs  Lab 11/09/20 1315 11/11/20 0720  WBC 4.9 5.4  HGB 9.4* 9.3*  HCT 29.6* 28.6*  MCV 96.7 94.7  PLT 150 128*    Basic Metabolic Panel: Recent Labs  Lab 11/07/20 0116 11/09/20 1315 11/11/20 0720 11/12/20 1123  NA 136 139 135 134*  K 4.3 3.7 3.7 3.6  CL 98 99 96* 98  CO2 26 26 25 24   GLUCOSE 102* 108* 99 149*  BUN 52* 38* 41* 25*  CREATININE 10.60* 7.67* 8.81* 6.62*  CALCIUM 9.0 8.9 9.3 9.7  MG 2.2  --   --  2.0  PHOS  --  5.3* 6.7*  --     GFR: Estimated Creatinine Clearance: 10.3 mL/min (A) (by C-G formula based on SCr of 6.62 mg/dL (H)). Liver Function Tests: Recent Labs  Lab 11/07/20 0116 11/09/20 1315 11/11/20 0720  AST 21  --   --   ALT <5  --   --   ALKPHOS 47  --   --   BILITOT 0.9  --   --   PROT 5.9*  --   --   ALBUMIN 3.0* 3.3* 3.2*    No results for input(s): LIPASE, AMYLASE in the last 168 hours. No results for input(s): AMMONIA in the last 168 hours.  Coagulation Profile: No results for input(s): INR, PROTIME in the last 168 hours. Cardiac Enzymes: No results for input(s): CKTOTAL, CKMB, CKMBINDEX, TROPONINI in the last 168 hours. BNP (last 3 results) No results for input(s): PROBNP in the last 8760 hours. HbA1C: No results for input(s): HGBA1C in the last 72 hours. CBG: Recent Labs  Lab 11/08/20 0843  GLUCAP 112*    Lipid Profile: No results for input(s): CHOL, HDL, LDLCALC, TRIG, CHOLHDL, LDLDIRECT in the last 72 hours. Thyroid Function Tests: No results for input(s): TSH, T4TOTAL, FREET4, T3FREE, THYROIDAB in the last 72 hours.  Anemia Panel: No results for input(s): VITAMINB12, FOLATE, FERRITIN, TIBC, IRON, RETICCTPCT in the last 72 hours.  Sepsis Labs: No results for  input(s): PROCALCITON, LATICACIDVEN in the last 168 hours.   Recent Results (from the past 240 hour(s))  Resp Panel by RT-PCR (Flu A&B, Covid) Nasopharyngeal Swab     Status: None   Collection Time: 11/04/20 12:07 AM   Specimen: Nasopharyngeal Swab; Nasopharyngeal(NP) swabs in vial transport medium  Result Value Ref Range Status   SARS Coronavirus 2 by RT PCR NEGATIVE NEGATIVE Final    Comment: (NOTE) SARS-CoV-2 target nucleic acids are NOT DETECTED.  The SARS-CoV-2 RNA is generally detectable in upper respiratory specimens during the acute phase of infection. The lowest concentration of SARS-CoV-2 viral copies this assay can detect is 138 copies/mL. A negative result does not preclude SARS-Cov-2 infection and should not be used as the sole basis for treatment or other patient management decisions. A negative result may occur with  improper specimen collection/handling,  submission of specimen other than nasopharyngeal swab, presence of viral mutation(s) within the areas targeted by this assay, and inadequate number of viral copies(<138 copies/mL). A negative result must be combined with clinical observations, patient history, and epidemiological information. The expected result is Negative.  Fact Sheet for Patients:  EntrepreneurPulse.com.au  Fact Sheet for Healthcare Providers:  IncredibleEmployment.be  This test is no t yet approved or cleared by the [place] FDA and  has been authorized for detection and/or diagnosis of SARS-CoV-2 by FDA under an Emergency Use Authorization (EUA). This EUA will remain  in effect (meaning this test can be used) for the duration of the COVID-19 declaration under Section 564(b)(1) of the Act, 21 U.S.C.section 360bbb-3(b)(1), unless the authorization is terminated  or revoked sooner.       Influenza A by PCR NEGATIVE NEGATIVE Final   Influenza B by PCR NEGATIVE NEGATIVE Final    Comment: (NOTE) The  Xpert Xpress SARS-CoV-2/FLU/RSV plus assay is intended as an aid in the diagnosis of influenza from Nasopharyngeal swab specimens and should not be used as a sole basis for treatment. Nasal washings and aspirates are unacceptable for Xpert Xpress SARS-CoV-2/FLU/RSV testing.  Fact Sheet for Patients: EntrepreneurPulse.com.au  Fact Sheet for Healthcare Providers: IncredibleEmployment.be  This test is not yet approved or cleared by the [place] FDA and has been authorized for detection and/or diagnosis of SARS-CoV-2 by FDA under an Emergency Use Authorization (EUA). This EUA will remain in effect (meaning this test can be used) for the duration of the COVID-19 declaration under Section 564(b)(1) of the Act, 21 U.S.C. section 360bbb-3(b)(1), unless the authorization is terminated or revoked.  Performed at [facility], [place] [address], [place], [place] [address]   SARS CORONAVIRUS 2 (TAT 6-24 HRS) Nasopharyngeal Nasopharyngeal Swab     Status: None   Collection Time: 11/09/20  4:35 PM   Specimen: Nasopharyngeal Swab  Result Value Ref Range Status   SARS Coronavirus 2 NEGATIVE NEGATIVE Final    Comment: (NOTE) SARS-CoV-2 target nucleic acids are NOT DETECTED.  The SARS-CoV-2 RNA is generally detectable in upper and lower respiratory specimens during the acute phase of infection. Negative results do not preclude SARS-CoV-2 infection, do not rule out co-infections with other pathogens, and should not be used as the sole basis for treatment or other patient management decisions. Negative results must be combined with clinical observations, patient history, and epidemiological information. The expected result is Negative.  Fact Sheet for Patients: SugarRoll.be  Fact Sheet for Healthcare Providers: https://www.woods-mathews.com/  This test is not yet approved or cleared by the [place] FDA  and  has been authorized for detection and/or diagnosis of SARS-CoV-2 by FDA under an Emergency Use Authorization (EUA). This EUA will remain  in effect (meaning this test can be used) for the duration of the COVID-19 declaration under Se ction 564(b)(1) of the Act, 21 U.S.C. section 360bbb-3(b)(1), unless the authorization is terminated or revoked sooner.  Performed at [facility], [place] [address], [place], [place] [address]      Radiology Studies: No results found.  Scheduled Meds:  amiodarone  200 mg Oral Daily   amLODipine  10 mg Oral Daily   atorvastatin  80 mg Oral Q supper   Chlorhexidine Gluconate Cloth  6 each Topical Q0600   Chlorhexidine Gluconate Cloth  6 each Topical Q0600   Chlorhexidine Gluconate Cloth  6 each Topical Q0600   clopidogrel  75 mg Oral Daily   heparin  5,000  Units Subcutaneous Q8H   lanthanum  2,000 mg Oral TID WC   Continuous Infusions:   LOS: 8 days   Time spent: 25 minutes  Darliss Cheney, MD [facility]

## 2020-11-13 NOTE — Progress Notes (Signed)
[facility] ROUNDING NOTE   Subjective:   Interval History: 70 year old end-stage renal disease Monday Wednesday Friday dialysis.  Admitted with altered mental status increased lethargy and hallucinations.  Confusion thought to be secondary to metabolic encephalopathy and hallucinations secondary to opiates and anesthesia use.  Patient has undergone a recent left dorsal/palmar wrist mass excision 11/02/2020.  He was using OxyContin for pain control.   Last dialysis treatment 11/11/2020 with 1 L removed.  Next dialysis be 11/14/2020  Blood pressure 123/53 pulse 68 temperature 98.6 O2 sats 90% room air  Sodium 134 potassium 3.6 chloride 98 CO2 24 BUN 25 creatinine 6.6 glucose 149  Objective:  Vital signs in last 24 hours:  Temp:  [98 F (36.7 C)-98.6 F (37 C)] 98.6 F (37 C) (09/04 0412) Pulse Rate:  [64-72] 66 (09/04 0412) Resp:  [16-18] 16 (09/04 0412) BP: (89-137)/(50-69) 123/53 (09/04 0412) SpO2:  [95 %-100 %] 100 % (09/04 0412)  Weight change:  Filed Weights   11/09/20 1600 11/11/20 0805 11/11/20 1154  Weight: 69.5 kg 69 kg 68 kg    Intake/Output: No intake/output data recorded.   Intake/Output this shift:  No intake/output data recorded.  General:chronically ill appearing male in NAD Heart:RRR, no mrg Lungs:CTAB, nml WOB on RA Abdomen:soft, NTND Extremities:R BKA, no edema b/l Dialysis Access: RU AVF +b/t    Basic Metabolic Panel: Recent Labs  Lab 11/06/20 1119 11/07/20 0116 11/09/20 1315 11/11/20 0720 11/12/20 1123  NA 136 136 139 135 134*  K 3.9 4.3 3.7 3.7 3.6  CL 97* 98 99 96* 98  CO2 26 26 26 25 24   GLUCOSE 109* 102* 108* 99 149*  BUN 45* 52* 38* 41* 25*  CREATININE 9.53* 10.60* 7.67* 8.81* 6.62*  CALCIUM 9.3 9.0 8.9 9.3 9.7  MG  --  2.2  --   --  2.0  PHOS 6.5*  --  5.3* 6.7*  --      Liver Function Tests: Recent Labs  Lab 11/06/20 1119 11/07/20 0116 11/09/20 1315 11/11/20 0720  AST  --  21  --   --   ALT  --  <5  --   --    ALKPHOS  --  47  --   --   BILITOT  --  0.9  --   --   PROT  --  5.9*  --   --   ALBUMIN 3.0* 3.0* 3.3* 3.2*    No results for input(s): LIPASE, AMYLASE in the last 168 hours. No results for input(s): AMMONIA in the last 168 hours.   CBC: Recent Labs  Lab 11/06/20 1119 11/09/20 1315 11/11/20 0720  WBC 6.4 4.9 5.4  NEUTROABS 4.9  --   --   HGB 8.3* 9.4* 9.3*  HCT 26.0* 29.6* 28.6*  MCV 95.6 96.7 94.7  PLT 123* 150 128*     Cardiac Enzymes: No results for input(s): CKTOTAL, CKMB, CKMBINDEX, TROPONINI in the last 168 hours.  BNP: Invalid input(s): POCBNP  CBG: Recent Labs  Lab 11/08/20 0843  GLUCAP 112*     Microbiology: Results for orders placed or performed during the hospital encounter of 11/03/20  Resp Panel by RT-PCR (Flu A&B, Covid) Nasopharyngeal Swab     Status: None   Collection Time: 11/04/20 12:07 AM   Specimen: Nasopharyngeal Swab; Nasopharyngeal(NP) swabs in vial transport medium  Result Value Ref Range Status   SARS Coronavirus 2 by RT PCR NEGATIVE NEGATIVE Final    Comment: (NOTE) SARS-CoV-2 target nucleic acids are NOT  DETECTED.  The SARS-CoV-2 RNA is generally detectable in upper respiratory specimens during the acute phase of infection. The lowest concentration of SARS-CoV-2 viral copies this assay can detect is 138 copies/mL. A negative result does not preclude SARS-Cov-2 infection and should not be used as the sole basis for treatment or other patient management decisions. A negative result may occur with  improper specimen collection/handling, submission of specimen other than nasopharyngeal swab, presence of viral mutation(s) within the areas targeted by this assay, and inadequate number of viral copies(<138 copies/mL). A negative result must be combined with clinical observations, patient history, and epidemiological information. The expected result is Negative.  Fact Sheet for Patients:   EntrepreneurPulse.com.au  Fact Sheet for Healthcare Providers:  IncredibleEmployment.be  This test is no t yet approved or cleared by the [place] FDA and  has been authorized for detection and/or diagnosis of SARS-CoV-2 by FDA under an Emergency Use Authorization (EUA). This EUA will remain  in effect (meaning this test can be used) for the duration of the COVID-19 declaration under Section 564(b)(1) of the Act, 21 U.S.C.section 360bbb-3(b)(1), unless the authorization is terminated  or revoked sooner.       Influenza A by PCR NEGATIVE NEGATIVE Final   Influenza B by PCR NEGATIVE NEGATIVE Final    Comment: (NOTE) The Xpert Xpress SARS-CoV-2/FLU/RSV plus assay is intended as an aid in the diagnosis of influenza from Nasopharyngeal swab specimens and should not be used as a sole basis for treatment. Nasal washings and aspirates are unacceptable for Xpert Xpress SARS-CoV-2/FLU/RSV testing.  Fact Sheet for Patients: EntrepreneurPulse.com.au  Fact Sheet for Healthcare Providers: IncredibleEmployment.be  This test is not yet approved or cleared by the [place] FDA and has been authorized for detection and/or diagnosis of SARS-CoV-2 by FDA under an Emergency Use Authorization (EUA). This EUA will remain in effect (meaning this test can be used) for the duration of the COVID-19 declaration under Section 564(b)(1) of the Act, 21 U.S.C. section 360bbb-3(b)(1), unless the authorization is terminated or revoked.  Performed at [facility], [place] [address] [place], [place] [address]   SARS CORONAVIRUS 2 (TAT 6-24 HRS) Nasopharyngeal Nasopharyngeal Swab     Status: None   Collection Time: 11/09/20  4:35 PM   Specimen: Nasopharyngeal Swab  Result Value Ref Range Status   SARS Coronavirus 2 NEGATIVE NEGATIVE Final    Comment: (NOTE) SARS-CoV-2 target nucleic acids are NOT DETECTED.  The  SARS-CoV-2 RNA is generally detectable in upper and lower respiratory specimens during the acute phase of infection. Negative results do not preclude SARS-CoV-2 infection, do not rule out co-infections with other pathogens, and should not be used as the sole basis for treatment or other patient management decisions. Negative results must be combined with clinical observations, patient history, and epidemiological information. The expected result is Negative.  Fact Sheet for Patients: SugarRoll.be  Fact Sheet for Healthcare Providers: https://www.woods-mathews.com/  This test is not yet approved or cleared by the [place] FDA and  has been authorized for detection and/or diagnosis of SARS-CoV-2 by FDA under an Emergency Use Authorization (EUA). This EUA will remain  in effect (meaning this test can be used) for the duration of the COVID-19 declaration under Se ction 564(b)(1) of the Act, 21 U.S.C. section 360bbb-3(b)(1), unless the authorization is terminated or revoked sooner.  Performed at [facility], [place] [address] [place], [place] [address]    *Note: Due to a large number of results and/or encounters for  the requested time period, some results have not been displayed. A complete set of results can be found in Results Review.    Coagulation Studies: No results for input(s): LABPROT, INR in the last 72 hours.  Urinalysis: No results for input(s): COLORURINE, LABSPEC, PHURINE, GLUCOSEU, HGBUR, BILIRUBINUR, KETONESUR, PROTEINUR, UROBILINOGEN, NITRITE, LEUKOCYTESUR in the last 72 hours.  Invalid input(s): APPERANCEUR    Imaging: No results found.   Medications:      amiodarone  200 mg Oral Daily   amLODipine  10 mg Oral Daily   atorvastatin  80 mg Oral Q supper   Chlorhexidine Gluconate Cloth  6 each Topical Q0600   Chlorhexidine Gluconate Cloth  6 each Topical Q0600   clopidogrel  75 mg Oral Daily   heparin   5,000 Units Subcutaneous Q8H   lanthanum  2,000 mg Oral TID WC   acetaminophen **OR** acetaminophen, hydrALAZINE, LORazepam, polyethylene glycol  Assessment/ Plan:   Dialysis Orders: [facility] MWF 4hrs 400/AF 1.5 2K 2Ca Profile 4 EDW 76.5kg RU AVF Hep 2400 Venofer 118mcg x5HD Mircera 1mcg on 8/17 Calcitriol 62mcg qHD  Assessment/Plan:  Confusion/hallucinations - CT negative.  Etiology unclear.  Improved today. Debility - plan for SNF placement  ESRD -  on HD MWF.  Next dialysis treatment 11/14/2020 BP variable. Continue home meds.  Does not appear volume overloaded.   Continue UF as tolerated.   Anemia of CKD - Last Hgb 9.2. ESA due 8/31.   Secondary Hyperparathyroidism -  Ca in goal.  Phos elevated, continue binders and VDRA.  Nutrition - Renal diet w/fluid restrictions CAD/CHF/severe AS s/p AVR PVD s/p R BKA   LOS: 8 Sherril Croon @TODAY @7 :29 AM

## 2020-11-14 NOTE — Discharge Summary (Signed)
Physician Discharge Summary  Jose Garrett AJO:[id number] DOB: 01/19/1951 DOA: 11/03/2020  PCP: [name], NP  Admit date: 11/03/2020 Discharge date: 11/14/2020 30 Day Unplanned Readmission Risk Score    Flowsheet Row ED to Hosp-Admission (Current) from 11/03/2020 in [facility]  30 Day Unplanned Readmission Risk Score (%) 26.32 Filed at 11/10/2020 0400       This score is the patient's risk of an unplanned readmission within 30 days of being discharged (0 -100%). The score is based on dignosis, age, lab data, medications, orders, and past utilization.   Low:  0-14.9   Medium: 15-21.9   High: 22-29.9   Extreme: 30 and above          Admitted From: Home Disposition: SNF  Recommendations for Outpatient Follow-up:  Follow up with PCP in 1-2 weeks Please obtain BMP/CBC in one week Follow-up with nephrology for your routine hemodialysis. Please follow up with your PCP on the following pending results: Unresulted Labs (From admission, onward)     Start     Ordered   Signed and Held  Renal function panel  Once,   R       Question:  Specimen collection method  Answer:  Lab=Lab collect   Signed and Held   Signed and Held  CBC  Once,   R       Question:  Specimen collection method  Answer:  Lab=Lab collect   Signed and Held              Home Health: None Equipment/Devices: None  Discharge Condition: Stable CODE STATUS: Full code Diet recommendation: Low-sodium/cardiac diet  Subjective: Seen and examined.  No complaints.  Excited about getting discharged today.  Brief/Interim Summary: 70 year old male with past medical history of ESRD hemodialysis Monday Wednesday Friday, chronic combined heart failure, severe aortic stenosis status post TAVR, hypertension, hyperlipidemia, GERD, right BKA, excisional biopsy of the left forearm lesion 8/24 brought to the ER with worsening disorientation, lethargy and hallucinations.  Lives at home with daughter.  Work-up so  far negative. Patient received general anesthesia on 8/24 for cyst excision.  Probably encephalopathy due to medications, opiates and benzodiazepines.  Hemodynamically stable.  Neurologically stable.  Encephalopathy resolved.  Due to his ESRD status, nephrology was consulted and he received his dialysis.  Rest of the medical issues remained stable.  Evaluated by PT OT and they recommended SNF.  He was also seen by Dr. Burney Gauze of orthopedics and his left wrist dressing was changed today.  Patient has been offered a bed at SNF and he is a stable so he will be discharged today.  Of note, patient was initially discharged on 11/10/2020 however after discharge paperwork and summary was completed, patient's skilled nursing facility refused to take him and stating that they did not have enough staffing for new admissions and patient ended up staying in the hospital for couple of more days until our [facility] was able to secure a bed at another facility and according to [facility], patient can be discharged today.  Patient has remained stable and medically ready since last several days and he is being discharged today in stable condition  Discharge Diagnoses:  Principal Problem:   Acute metabolic encephalopathy Active Problems:   Essential hypertension, benign   ESRD (end stage renal disease) on dialysis [facility])   Coronary artery disease involving native coronary artery of native heart without angina pectoris   GERD without esophagitis   Chronic combined systolic and diastolic CHF (congestive  heart failure) ([place])   Mixed hyperlipidemia   Altered mental status    Discharge Instructions   Allergies as of 11/14/2020   No Known Allergies      Medication List     STOP taking these medications    Oxycodone HCl 10 MG Tabs       TAKE these medications    acetaminophen 325 MG tablet Commonly known as: TYLENOL Take 1-2 tablets (325-650 mg total) by mouth every 4 (four) hours as needed for mild pain (or temp >/=  101 F).   amiodarone 200 MG tablet Commonly known as: PACERONE TAKE 1 TABLET (200 MG TOTAL) BY MOUTH DAILY.   amLODipine 10 MG tablet Commonly known as: NORVASC Take 1 tablet (10 mg total) by mouth at bedtime.   atorvastatin 80 MG tablet Commonly known as: LIPITOR TAKE 1 TABLET BY MOUTH DAILY WITH SUPPER   clopidogrel 75 MG tablet Commonly known as: PLAVIX TAKE 1 TABLET BY MOUTH DAILY WITH SUPPER What changed: Another medication with the same name was removed. Continue taking this medication, and follow the directions you see here.   Darbepoetin Alfa 100 MCG/0.5ML Sosy injection Commonly known as: ARANESP Inject 0.5 mLs (100 mcg total) into the vein every Friday with hemodialysis.   docusate sodium 100 MG capsule Commonly known as: COLACE Take 1 capsule (100 mg total) by mouth 2 (two) times daily. What changed:  when to take this reasons to take this   iron sucrose in sodium chloride 0.9 % 100 mL Iron Sucrose (Venofer)   lanthanum 1000 MG chewable tablet Commonly known as: FOSRENOL Chew 2,000 mg by mouth 3 (three) times daily.   LORazepam 1 MG tablet Commonly known as: ATIVAN Take 1 tablet (1 mg total) by mouth 2 (two) times daily as needed for anxiety.   MIRCERA IJ Mircera   multivitamin Tabs tablet Take 1 tablet by mouth at bedtime.   Rena-Vite Rx 1 MG Tabs Take 1 tablet by mouth once a day   ondansetron 4 MG tablet Commonly known as: ZOFRAN TAKE 1 TABLET BY MOUTH TWICE A DAY AS NEEDED   oxyCODONE-acetaminophen 5-325 MG tablet Commonly known as: Percocet Take 1 tablet by mouth every 4 (four) hours as needed for severe pain.   pantoprazole 40 MG tablet Commonly known as: PROTONIX Take 1 tablet (40 mg total) by mouth daily.   vitamin B-12 1000 MCG tablet Commonly known as: CYANOCOBALAMIN Take 1 tablet (1,000 mcg total) by mouth every evening.        Follow-up Information     [facility] .   Specialty:  Emergency Medicine Why: As needed, If symptoms worsen Contact information: [address] [phone] [place]        [name], NP. Call in 2 days.   Specialty: Nurse Practitioner Contact information: [place] [address] [phone]         [name], NP Follow up in 1 week(s).   Specialty: Nurse Practitioner Contact information: [place] [address] [phone]         Burnell Blanks, MD .   Specialty: Cardiology Contact information: [place] [place] [address] [phone]                No Known Allergies  Consultations: Nephrology and orthopedics   Procedures/Studies: DG Chest 1 View  Result Date: 11/04/2020 CLINICAL DATA:  Altered mental status EXAM: CHEST  1  VIEW COMPARISON:  10/17/2020 FINDINGS: Cardiac shadow is enlarged but stable. Changes of prior TAVR are seen. Increased central vascular congestion is noted with changes of mild edema. No focal confluent infiltrate is seen. IMPRESSION: Early CHF.  No other focal abnormality is noted. Electronically Signed   By: Inez Catalina M.D.   On: 11/04/2020 01:47   CT Head Wo Contrast  Result Date: 11/03/2020 CLINICAL DATA:  Mental status changes EXAM: CT HEAD WITHOUT CONTRAST TECHNIQUE: Contiguous axial images were obtained from the base of the skull through the vertex without intravenous contrast. COMPARISON:  None. FINDINGS: Brain: No acute intracranial abnormality. Specifically, no hemorrhage, hydrocephalus, mass lesion, acute infarction, or significant intracranial injury. Vascular: No hyperdense vessel or unexpected calcification. Skull: No acute calvarial abnormality. Sinuses/Orbits: No acute findings Other: None IMPRESSION: No acute intracranial abnormality. Electronically Signed   By: Rolm Baptise M.D.   On: 11/03/2020 22:25   DG Hand 2 View Right  Result Date: 10/25/2020 CLINICAL  DATA:  Right hand cellulitis for 2-3 days. EXAM: RIGHT HAND - 2 VIEW COMPARISON:  None. FINDINGS: There is no evidence of an acute fracture or dislocation. A chronic deformity is seen involving the proximal aspect of the fourth right metacarpal. There is no evidence of arthropathy or other focal bone abnormality. Moderate severity diffuse soft tissue swelling is seen. Mild vascular calcification is also noted. Small radiopaque surgical clips are seen within the soft tissues adjacent to the volar aspect of the distal right radius. No soft tissue air is identified. IMPRESSION: 1. Diffuse soft tissue swelling which may represent diffuse cellulitis. 2. No acute osseous abnormality. Electronically Signed   By: Virgina Norfolk M.D.   On: 10/25/2020 22:50   DG CHEST PORT 1 VIEW  Result Date: 10/17/2020 CLINICAL DATA:  Follow-up pulmonary edema. EXAM: PORTABLE CHEST 1 VIEW COMPARISON:  Chest radiograph 10/16/2020 and earlier FINDINGS: Stable cardiomediastinal silhouette. TAVR. Aortic calcifications. Interval improvement of diffuse hazy interstitial thickening compared to yesterday's radiograph. Persistent subtle retrocardiac hazy opacity. No pleural effusion or pneumothorax. Visualized upper abdomen is unremarkable. IMPRESSION: 1. Interval improvement of pulmonary edema. 2. Persistent hazy retrocardiac airspace opacity is favored to represent atelectasis, though infection could have a similar appearance. Electronically Signed   By: Ileana Roup MD   On: 10/17/2020 15:56   DG Chest Port 1 View  Result Date: 10/16/2020 CLINICAL DATA:  Shortness of breath EXAM: PORTABLE CHEST 1 VIEW COMPARISON:  05/11/2019 FINDINGS: Cardiomegaly. Prior aortic valve repair. Diffuse interstitial prominence throughout the lungs, favor edema. Small bilateral effusions. No acute bony abnormality. IMPRESSION: Cardiomegaly. Diffuse interstitial prominence throughout the lungs, favor edema/CHF. Small effusions. Electronically Signed   By:  Rolm Baptise M.D.   On: 10/16/2020 20:27     Discharge Exam: Vitals:   11/14/20 0358 11/14/20 0833  BP: 106/70 (!) 115/56  Pulse: 75 69  Resp: 18 18  Temp: 98 F (36.7 C) 97.8 F (36.6 C)  SpO2: 96% 100%   Vitals:   11/13/20 2052 11/13/20 2342 11/14/20 0358 11/14/20 0833  BP: (!) 102/51 (!) 121/57 106/70 (!) 115/56  Pulse: 74 64 75 69  Resp: 19 18 18 18   Temp: 98 F (36.7 C) 98 F (36.7 C) 98 F (36.7 C) 97.8 F (36.6 C)  TempSrc: Oral Oral Oral Oral  SpO2: 100% 98% 96% 100%  Weight:        General: Pt is alert, awake, not in acute distress Cardiovascular: RRR, S1/S2 +, no rubs, no gallops Respiratory: CTA bilaterally, no wheezing,  no rhonchi Abdominal: Soft, NT, ND, bowel sounds + Extremities: Right BKA    The results of significant diagnostics from this hospitalization (including imaging, microbiology, ancillary and laboratory) are listed below for reference.     Microbiology: Recent Results (from the past 240 hour(s))  SARS CORONAVIRUS 2 (TAT 6-24 HRS) Nasopharyngeal Nasopharyngeal Swab     Status: None   Collection Time: 11/09/20  4:35 PM   Specimen: Nasopharyngeal Swab  Result Value Ref Range Status   SARS Coronavirus 2 NEGATIVE NEGATIVE Final    Comment: (NOTE) SARS-CoV-2 target nucleic acids are NOT DETECTED.  The SARS-CoV-2 RNA is generally detectable in upper and lower respiratory specimens during the acute phase of infection. Negative results do not preclude SARS-CoV-2 infection, do not rule out co-infections with other pathogens, and should not be used as the sole basis for treatment or other patient management decisions. Negative results must be combined with clinical observations, patient history, and epidemiological information. The expected result is Negative.  Fact Sheet for Patients: SugarRoll.be  Fact Sheet for Healthcare Providers: https://www.woods-mathews.com/  This test is not yet approved  or cleared by the [place] FDA and  has been authorized for detection and/or diagnosis of SARS-CoV-2 by FDA under an Emergency Use Authorization (EUA). This EUA will remain  in effect (meaning this test can be used) for the duration of the COVID-19 declaration under Se ction 564(b)(1) of the Act, 21 U.S.C. section 360bbb-3(b)(1), unless the authorization is terminated or revoked sooner.  Performed at [facility], [place] [address], [place], [place] [address]      Labs: BNP (last 3 results) Recent Labs    10/16/20 2034 11/04/20 0130  BNP 1,084.5* 784.8*    Basic Metabolic Panel: Recent Labs  Lab 11/09/20 1315 11/11/20 0720 11/12/20 1123  NA 139 135 134*  K 3.7 3.7 3.6  CL 99 96* 98  CO2 26 25 24   GLUCOSE 108* 99 149*  BUN 38* 41* 25*  CREATININE 7.67* 8.81* 6.62*  CALCIUM 8.9 9.3 9.7  MG  --   --  2.0  PHOS 5.3* 6.7*  --     Liver Function Tests: Recent Labs  Lab 11/09/20 1315 11/11/20 0720  ALBUMIN 3.3* 3.2*    No results for input(s): LIPASE, AMYLASE in the last 168 hours. No results for input(s): AMMONIA in the last 168 hours.  CBC: Recent Labs  Lab 11/09/20 1315 11/11/20 0720  WBC 4.9 5.4  HGB 9.4* 9.3*  HCT 29.6* 28.6*  MCV 96.7 94.7  PLT 150 128*    Cardiac Enzymes: No results for input(s): CKTOTAL, CKMB, CKMBINDEX, TROPONINI in the last 168 hours. BNP: Invalid input(s): POCBNP CBG: Recent Labs  Lab 11/08/20 0843  GLUCAP 112*    D-Dimer No results for input(s): DDIMER in the last 72 hours. Hgb A1c No results for input(s): HGBA1C in the last 72 hours. Lipid Profile No results for input(s): CHOL, HDL, LDLCALC, TRIG, CHOLHDL, LDLDIRECT in the last 72 hours. Thyroid function studies No results for input(s): TSH, T4TOTAL, T3FREE, THYROIDAB in the last 72 hours.  Invalid input(s): FREET3 Anemia work up No results for input(s): VITAMINB12, FOLATE, FERRITIN, TIBC, IRON, RETICCTPCT in the last 72 hours. Urinalysis     Component Value Date/Time   COLORURINE AMBER (A) 02/01/2016 1707   APPEARANCEUR CLOUDY (A) 02/01/2016 1707   LABSPEC 1.020 02/01/2016 1707   PHURINE 5.5 02/01/2016 1707   GLUCOSEU 100 (A) 02/01/2016 1707   HGBUR NEGATIVE 02/01/2016 1707   BILIRUBINUR SMALL (  A) 02/01/2016 1707   KETONESUR NEGATIVE 02/01/2016 1707   PROTEINUR >300 (A) 02/01/2016 1707   NITRITE NEGATIVE 02/01/2016 1707   LEUKOCYTESUR TRACE (A) 02/01/2016 1707   Sepsis Labs Invalid input(s): PROCALCITONIN,  WBC,  LACTICIDVEN Microbiology Recent Results (from the past 240 hour(s))  SARS CORONAVIRUS 2 (TAT 6-24 HRS) Nasopharyngeal Nasopharyngeal Swab     Status: None   Collection Time: 11/09/20  4:35 PM   Specimen: Nasopharyngeal Swab  Result Value Ref Range Status   SARS Coronavirus 2 NEGATIVE NEGATIVE Final    Comment: (NOTE) SARS-CoV-2 target nucleic acids are NOT DETECTED.  The SARS-CoV-2 RNA is generally detectable in upper and lower respiratory specimens during the acute phase of infection. Negative results do not preclude SARS-CoV-2 infection, do not rule out co-infections with other pathogens, and should not be used as the sole basis for treatment or other patient management decisions. Negative results must be combined with clinical observations, patient history, and epidemiological information. The expected result is Negative.  Fact Sheet for Patients: SugarRoll.be  Fact Sheet for Healthcare Providers: https://www.woods-mathews.com/  This test is not yet approved or cleared by the [place] FDA and  has been authorized for detection and/or diagnosis of SARS-CoV-2 by FDA under an Emergency Use Authorization (EUA). This EUA will remain  in effect (meaning this test can be used) for the duration of the COVID-19 declaration under Se ction 564(b)(1) of the Act, 21 U.S.C. section 360bbb-3(b)(1), unless the authorization is terminated or revoked  sooner.  Performed at [facility], [place] [address], [place],  [address]      Time coordinating discharge: Over 30 minutes  SIGNED:   Darliss Cheney, MD  [facility] 11/14/2020, 10:48 AM  If 7PM-7AM, please contact night-coverage [email or web address]

## 2020-11-14 NOTE — Progress Notes (Signed)
Night RN called [facility] to give report 4 times, no answer, message left on answering service. PTAR informed, IV removed. Patient transported by [facility]

## 2020-11-14 NOTE — Progress Notes (Signed)
Occupational Therapy Treatment Patient Details Name: Jose Garrett MRN: [id number] DOB: 08-Aug-1950 Today's Date: 11/14/2020    History of present illness 70 y/o male presented to ED on 8/25 for confusion and hallucinations. Patient recently with cyst removal on L hand on 8/24. Suspect due to disequilibrium syndrome. PMH: HTN, ESRD on dialysis MWF, CAD, GERD, CHF, severe aortic stenosis s/p TAVR (10/2018), HLD, R BKA 04/2019.   OT comments  Pt is making good progress toward goals.  He is able to complete ADLs and functional mobility with min guard - min A.  Continue to recommend SNF.   Follow Up Recommendations  SNF    Equipment Recommendations  None recommended by OT    Recommendations for Other Services      Precautions / Restrictions Precautions Precautions: Fall Precaution Comments: recent L hand sx with soft dressing to elbow       Mobility Bed Mobility Overal bed mobility: Needs Assistance Bed Mobility: Supine to Sit     Supine to sit: Supervision          Transfers Overall transfer level: Needs assistance Equipment used: Rolling walker (2 wheeled) Transfers: Sit to/from [facility] Sit to Stand: Min guard Stand pivot transfers: Min guard            Balance Overall balance assessment: Needs assistance Sitting-balance support: No upper extremity supported;Feet unsupported Sitting balance-Leahy Scale: Fair     Standing balance support: Single extremity supported;During functional activity Standing balance-Leahy Scale: Poor Standing balance comment: requires UE support                           ADL either performed or assessed with clinical judgement   ADL Overall ADL's : Needs assistance/impaired     Grooming: Wash/dry hands;Wash/dry face;Oral care;Brushing hair;Min guard;Standing               Lower Body Dressing: Minimal assistance;Sit to/from stand   Toilet Transfer: Min guard;Ambulation;Comfort height  toilet;RW           Functional mobility during ADLs: Min guard;Rolling walker       Vision       Perception     Praxis      Cognition Arousal/Alertness: Awake/alert Behavior During Therapy: WFL for tasks assessed/performed Overall Cognitive Status: No family/caregiver present to determine baseline cognitive functioning                                          Exercises     Shoulder Instructions       General Comments      Pertinent Vitals/ Pain       Pain Assessment: No/denies pain  Home Living                                          Prior Functioning/Environment              Frequency  Min 2X/week        Progress Toward Goals  OT Goals(current goals can now be found in the care plan section)  Progress towards OT goals: Progressing toward goals     Plan Discharge plan remains appropriate    Co-evaluation  AM-PAC OT "6 Clicks" Daily Activity     Outcome Measure   Help from another person eating meals?: None Help from another person taking care of personal grooming?: A Little Help from another person toileting, which includes using toliet, bedpan, or urinal?: A Little Help from another person bathing (including washing, rinsing, drying)?: A Little Help from another person to put on and taking off regular upper body clothing?: A Little Help from another person to put on and taking off regular lower body clothing?: A Little 6 Click Score: 19    End of Session Equipment Utilized During Treatment: Gait belt;Rolling walker  OT Visit Diagnosis: Unsteadiness on feet (R26.81);Cognitive communication deficit (R41.841)   Activity Tolerance Patient tolerated treatment well   Patient Left in bed;with call bell/phone within reach;with bed alarm set   Nurse Communication Mobility status        Time: 3225-6720 OT Time Calculation (min): 11 min  Charges: OT General Charges $OT Visit: 1  Visit OT Treatments $Therapeutic Activity: 8-22 mins  Nilsa Nutting OTR/L Acute Rehabilitation Services Pager [phone] Office [phone]    Lucille Passy M 11/14/2020, 4:26 PM

## 2020-11-14 NOTE — TOC Transition Note (Signed)
Transition of Care [facility]) - CM/SW Discharge Note   Patient Details  Name: Jose Garrett MRN: [id number] Date of Birth: Jul 04, 1950  [facility] CM/SW Contact:  Geralynn Ochs, LCSW Phone Number: 11/14/2020, 11:19 AM   Clinical Narrative:   Nurse to call report to [phone].    Final next level of care: Skilled Nursing Facility Barriers to Discharge: Barriers Resolved   Patient Goals and CMS Choice Patient states their goals for this hospitalization and ongoing recovery are:: Pt would like to reach independence again. CMS Medicare.gov Compare Post Acute Care list provided to:: Patient Represenative (must comment) [name], Daughter) Choice offered to / list presented to : Patient, Adult Children  Discharge Placement              Patient chooses bed at:  ([facility]) Patient to be transferred to facility by: [place] Name of family member notified: [name] Patient and family notified of of transfer: 11/14/20  Discharge Plan and Services     Post Acute Care Choice: [facility]                               Social Determinants of Health (SDOH) Interventions     Readmission Risk Interventions Readmission Risk Prevention Plan 04/15/2019 10/23/2018 08/20/2018  Transportation Screening Complete Complete -  PCP or Specialist Appt within 3-5 Days - Complete -  HRI or [place] - Complete -  Social Work Consult for [place] Planning/Counseling - Complete -  Palliative Care Screening - Not Applicable -  Medication Review Press photographer) Complete Complete -  PCP or Specialist appointment within 3-5 days of discharge Complete - Complete  HRI or Home Care Consult Complete - Complete  SW Recovery Care/Counseling Consult Complete - Complete  Palliative Care Screening Not Applicable - Not [place] Patient Refused - Not Applicable  Some recent data might be hidden

## 2020-11-14 NOTE — Progress Notes (Signed)
[facility] ROUNDING NOTE   Subjective:   Interval History: 70 year old end-stage renal disease Monday Wednesday Friday dialysis.  Admitted with altered mental status increased lethargy and hallucinations.  Confusion thought to be secondary to metabolic encephalopathy and hallucinations secondary to opiates and anesthesia use.  Patient has undergone a recent left dorsal/palmar wrist mass excision 11/02/2020.  He was using OxyContin for pain control.   Last dialysis treatment 11/11/2020 with 1 L removed.  Next dialysis be 11/14/2020  Blood pressure 123/53 pulse 68 temperature 98.6 O2 sats 90% room air    Objective:  Vital signs in last 24 hours:  Temp:  [97.8 F (36.6 C)-98.1 F (36.7 C)] 98 F (36.7 C) (09/05 1055) Pulse Rate:  [64-75] 65 (09/05 1300) Resp:  [16-22] 18 (09/05 1300) BP: (90-142)/(42-72) 90/42 (09/05 1300) SpO2:  [96 %-100 %] 100 % (09/05 1055) Weight:  [69 kg] 69 kg (09/05 1055)  Weight change:  Filed Weights   11/11/20 0805 11/11/20 1154 11/14/20 1055  Weight: 69 kg 68 kg 69 kg    Intake/Output: No intake/output data recorded.   Intake/Output this shift:  Total I/O In: 200 [P.O.:200] Out: -   General:chronically ill appearing male in NAD Heart:RRR, no mrg Lungs:CTAB, nml WOB on RA Abdomen:soft, NTND Extremities:R BKA, no edema b/l Dialysis Access: RU AVF +b/t    Basic Metabolic Panel: Recent Labs  Lab 11/09/20 1315 11/11/20 0720 11/12/20 1123  NA 139 135 134*  K 3.7 3.7 3.6  CL 99 96* 98  CO2 26 25 24   GLUCOSE 108* 99 149*  BUN 38* 41* 25*  CREATININE 7.67* 8.81* 6.62*  CALCIUM 8.9 9.3 9.7  MG  --   --  2.0  PHOS 5.3* 6.7*  --      Liver Function Tests: Recent Labs  Lab 11/09/20 1315 11/11/20 0720  ALBUMIN 3.3* 3.2*    No results for input(s): LIPASE, AMYLASE in the last 168 hours. No results for input(s): AMMONIA in the last 168 hours.   CBC: Recent Labs  Lab 11/09/20 1315 11/11/20 0720  WBC 4.9 5.4  HGB  9.4* 9.3*  HCT 29.6* 28.6*  MCV 96.7 94.7  PLT 150 128*     Cardiac Enzymes: No results for input(s): CKTOTAL, CKMB, CKMBINDEX, TROPONINI in the last 168 hours.  BNP: Invalid input(s): POCBNP  CBG: Recent Labs  Lab 11/08/20 0843  GLUCAP 112*     Microbiology: Results for orders placed or performed during the hospital encounter of 11/03/20  Resp Panel by RT-PCR (Flu A&B, Covid) Nasopharyngeal Swab     Status: None   Collection Time: 11/04/20 12:07 AM   Specimen: Nasopharyngeal Swab; Nasopharyngeal(NP) swabs in vial transport medium  Result Value Ref Range Status   SARS Coronavirus 2 by RT PCR NEGATIVE NEGATIVE Final    Comment: (NOTE) SARS-CoV-2 target nucleic acids are NOT DETECTED.  The SARS-CoV-2 RNA is generally detectable in upper respiratory specimens during the acute phase of infection. The lowest concentration of SARS-CoV-2 viral copies this assay can detect is 138 copies/mL. A negative result does not preclude SARS-Cov-2 infection and should not be used as the sole basis for treatment or other patient management decisions. A negative result may occur with  improper specimen collection/handling, submission of specimen other than nasopharyngeal swab, presence of viral mutation(s) within the areas targeted by this assay, and inadequate number of viral copies(<138 copies/mL). A negative result must be combined with clinical observations, patient history, and epidemiological information. The expected result is Negative.  Fact Sheet for Patients:  EntrepreneurPulse.com.au  Fact Sheet for Healthcare Providers:  IncredibleEmployment.be  This test is no t yet approved or cleared by the [place] FDA and  has been authorized for detection and/or diagnosis of SARS-CoV-2 by FDA under an Emergency Use Authorization (EUA). This EUA will remain  in effect (meaning this test can be used) for the duration of the COVID-19 declaration  under Section 564(b)(1) of the Act, 21 U.S.C.section 360bbb-3(b)(1), unless the authorization is terminated  or revoked sooner.       Influenza A by PCR NEGATIVE NEGATIVE Final   Influenza B by PCR NEGATIVE NEGATIVE Final    Comment: (NOTE) The Xpert Xpress SARS-CoV-2/FLU/RSV plus assay is intended as an aid in the diagnosis of influenza from Nasopharyngeal swab specimens and should not be used as a sole basis for treatment. Nasal washings and aspirates are unacceptable for Xpert Xpress SARS-CoV-2/FLU/RSV testing.  Fact Sheet for Patients: EntrepreneurPulse.com.au  Fact Sheet for Healthcare Providers: IncredibleEmployment.be  This test is not yet approved or cleared by the [place] FDA and has been authorized for detection and/or diagnosis of SARS-CoV-2 by FDA under an Emergency Use Authorization (EUA). This EUA will remain in effect (meaning this test can be used) for the duration of the COVID-19 declaration under Section 564(b)(1) of the Act, 21 U.S.C. section 360bbb-3(b)(1), unless the authorization is terminated or revoked.  Performed at [facility], [place] [address], [place], [place] [address]   SARS CORONAVIRUS 2 (TAT 6-24 HRS) Nasopharyngeal Nasopharyngeal Swab     Status: None   Collection Time: 11/09/20  4:35 PM   Specimen: Nasopharyngeal Swab  Result Value Ref Range Status   SARS Coronavirus 2 NEGATIVE NEGATIVE Final    Comment: (NOTE) SARS-CoV-2 target nucleic acids are NOT DETECTED.  The SARS-CoV-2 RNA is generally detectable in upper and lower respiratory specimens during the acute phase of infection. Negative results do not preclude SARS-CoV-2 infection, do not rule out co-infections with other pathogens, and should not be used as the sole basis for treatment or other patient management decisions. Negative results must be combined with clinical observations, patient history, and epidemiological information. The  expected result is Negative.  Fact Sheet for Patients: SugarRoll.be  Fact Sheet for Healthcare Providers: https://www.woods-mathews.com/  This test is not yet approved or cleared by the [place] FDA and  has been authorized for detection and/or diagnosis of SARS-CoV-2 by FDA under an Emergency Use Authorization (EUA). This EUA will remain  in effect (meaning this test can be used) for the duration of the COVID-19 declaration under Se ction 564(b)(1) of the Act, 21 U.S.C. section 360bbb-3(b)(1), unless the authorization is terminated or revoked sooner.  Performed at [facility], [place] [address] [place], [place] [address]    *Note: Due to a large number of results and/or encounters for the requested time period, some results have not been displayed. A complete set of results can be found in Results Review.    Coagulation Studies: No results for input(s): LABPROT, INR in the last 72 hours.  Urinalysis: No results for input(s): COLORURINE, LABSPEC, PHURINE, GLUCOSEU, HGBUR, BILIRUBINUR, KETONESUR, PROTEINUR, UROBILINOGEN, NITRITE, LEUKOCYTESUR in the last 72 hours.  Invalid input(s): APPERANCEUR    Imaging: No results found.   Medications:      amiodarone  200 mg Oral Daily   amLODipine  10 mg Oral Daily   atorvastatin  80 mg Oral Q supper   Chlorhexidine Gluconate Cloth  6 each Topical Q0600  Chlorhexidine Gluconate Cloth  6 each Topical Q0600   Chlorhexidine Gluconate Cloth  6 each Topical Q0600   clopidogrel  75 mg Oral Daily   heparin  5,000 Units Subcutaneous Q8H   lanthanum  2,000 mg Oral TID WC   acetaminophen **OR** acetaminophen, hydrALAZINE, LORazepam, polyethylene glycol  Assessment/ Plan:   Dialysis Orders: [facility] MWF 4hrs 400/AF 1.5 2K 2Ca Profile 4 EDW 76.5kg RU AVF Hep 2400 Venofer 178mcg x5HD Mircera 3mcg on 8/17 Calcitriol 32mcg qHD  Assessment/Plan:  Confusion/hallucinations - CT  negative.  Etiology unclear.  Improved today. Going to SNF after HD today.  Debility - for dc to SNF today  ESRD -  on HD MWF.   BP variable. Continue home meds.  Does not appear volume overloaded.      Anemia of CKD - Last Hgb 9.2. ESA due 8/31.   Secondary Hyperparathyroidism -  Ca in goal.  Phos elevated, continue binders and VDRA.  Nutrition - Renal diet w/fluid restrictions CAD/CHF/severe AS s/p AVR PVD s/p R BKA   LOS: 9 Tawnie Ehresman D [name] @TODAY @1 :13 PM

## 2020-11-14 NOTE — Progress Notes (Signed)
Tried to call report to facility, No response from facility. Will try and call back later.

## 2020-11-14 NOTE — Progress Notes (Signed)
PT Cancellation Note  Patient Details Name: NYEEM STOKE MRN: [id number] DOB: [birth year]   Cancelled Treatment:    Reason Eval/Treat Not Completed: (P) Patient at procedure or test/unavailable (pt at HD dept.) Will continue efforts per PT POC as schedule permits.    Kara Pacer Layonna Dobie 11/14/2020, 12:54 PM

## 2020-11-21 ENCOUNTER — Telehealth: Payer: Self-pay | Admitting: *Deleted

## 2020-11-22 NOTE — Telephone Encounter (Signed)
Handicap application to be signed by Dr. Angelena Form and then will drop in outgoing mail to patient.

## 2020-12-08 ENCOUNTER — Encounter: Payer: Self-pay | Admitting: Neurology

## 2020-12-28 ENCOUNTER — Other Ambulatory Visit (HOSPITAL_COMMUNITY): Payer: Self-pay

## 2020-12-28 MED ORDER — CLOPIDOGREL BISULFATE 75 MG PO TABS
ORAL_TABLET | ORAL | 5 refills | Status: DC
  Filled 2020-12-28: qty 90, 90d supply, fill #0

## 2021-01-05 ENCOUNTER — Other Ambulatory Visit (HOSPITAL_COMMUNITY): Payer: Self-pay

## 2021-01-09 ENCOUNTER — Other Ambulatory Visit (HOSPITAL_COMMUNITY): Payer: Self-pay | Admitting: Physician Assistant

## 2021-01-10 ENCOUNTER — Other Ambulatory Visit (HOSPITAL_COMMUNITY): Payer: Self-pay

## 2021-01-11 ENCOUNTER — Other Ambulatory Visit (HOSPITAL_COMMUNITY): Payer: Self-pay

## 2021-01-16 ENCOUNTER — Other Ambulatory Visit (HOSPITAL_COMMUNITY): Payer: Self-pay

## 2021-01-17 ENCOUNTER — Other Ambulatory Visit (HOSPITAL_COMMUNITY): Payer: Self-pay

## 2021-01-18 ENCOUNTER — Encounter (HOSPITAL_COMMUNITY): Payer: Self-pay | Admitting: *Deleted

## 2021-01-18 ENCOUNTER — Other Ambulatory Visit (HOSPITAL_COMMUNITY): Payer: Self-pay

## 2021-01-18 ENCOUNTER — Encounter: Payer: Self-pay | Admitting: Hematology

## 2021-01-18 MED ORDER — ETHYL CHLORIDE EX AERO
INHALATION_SPRAY | CUTANEOUS | 3 refills | Status: AC
  Filled 2021-01-18: qty 116, 30d supply, fill #0

## 2021-01-19 ENCOUNTER — Other Ambulatory Visit (HOSPITAL_COMMUNITY): Payer: Self-pay

## 2021-01-24 ENCOUNTER — Other Ambulatory Visit (HOSPITAL_COMMUNITY): Payer: Self-pay

## 2021-01-24 MED ORDER — ALBUTEROL SULFATE HFA 108 (90 BASE) MCG/ACT IN AERS
INHALATION_SPRAY | RESPIRATORY_TRACT | 11 refills | Status: AC
  Filled 2021-01-24: qty 8.5, 50d supply, fill #0

## 2021-01-27 ENCOUNTER — Other Ambulatory Visit (HOSPITAL_COMMUNITY): Payer: Self-pay

## 2021-02-17 ENCOUNTER — Ambulatory Visit: Payer: Medicare Other | Admitting: Neurology

## 2021-02-17 ENCOUNTER — Other Ambulatory Visit: Payer: Self-pay

## 2021-02-17 ENCOUNTER — Encounter: Payer: Self-pay | Admitting: Neurology

## 2021-02-17 VITALS — BP 205/65 | HR 86 | Ht 70.0 in

## 2021-02-17 DIAGNOSIS — S90812A Abrasion, left foot, initial encounter: Secondary | ICD-10-CM

## 2021-02-17 DIAGNOSIS — G629 Polyneuropathy, unspecified: Secondary | ICD-10-CM

## 2021-02-17 DIAGNOSIS — R29898 Other symptoms and signs involving the musculoskeletal system: Secondary | ICD-10-CM | POA: Diagnosis not present

## 2021-02-17 NOTE — Patient Instructions (Addendum)
We will refer you to [facility] occupational therapy for hand strengthening

## 2021-02-17 NOTE — Progress Notes (Signed)
[facility] Clinic Note - Initial Visit   Date: 02/20/21  Jose Garrett. MRN: [id number] DOB: December 07, 1950   Dear Dr.Peeples:  Thank you for your kind referral of Jose Garrett. for consultation of bilateral hand paresthesias. Although his history is well known to you, please allow [place] to reiterate it for the purpose of our medical record. The patient was accompanied to the clinic by daughter who also provides collateral information.     History of Present Illness: Jose Garrett. is a 70 y.o. right-handed male with AL amyloidosis complicated by ESRD on HD (MWF) and neuropathy, PAD s/p left common femoral to below knee popliteal bypass, Right BKA (2021), left 2nd and 3rd toe amputation, history of multiple myeloma, CAD, AS s/p TAVR, CHF,GERD, hypertension, and hyperlipidemia presenting for evaluation of bilateral hand paresthesias.   He was previously evaluated here in 2019 by my colleague, Dr. Delice Lesch for neuropathy involving the legs and was taking gabapentin $RemoveBeforeDEI'900mg'BfcLqcDljEaDwnkZ$ /d. Over the years, his burning pain and tingling has transitioned into numbness.  The numbness is up to the level of his knees.  He has right BKA due to PAD and left 2nd and 3rd toe amputation.  Starting 6 months ago, he began having numbness in both hands, which initially was worse in the right hand. He endorses hand weakness and finds it difficult to manipulate objects, especially with the left hand where he has difficulty extending his fingers.   He has been in a wheelchair for the past 8 months.   He lives with daughter.     Out-side paper records, electronic medical record, and images have been reviewed where available and summarized as:  NCS/EMG of the legs 03/21/2017: There is a chronic and symmetric axonal sensorimotor polyneuropathy affecting the lower extremities, which was previously not present on his study dated 10/12/2014. These findings are severe in degree electrically.  There is  evidence of a superimposed L3-4 radiculopathy affecting bilateral lower extremities, and worse on the right.   Lab Results  Component Value Date   HGBA1C 5.0 10/16/2018   Lab Results  Component Value Date   VITAMINB12 264 11/04/2020   Lab Results  Component Value Date   TSH 0.829 11/04/2020   Lab Results  Component Value Date   ESRSEDRATE 134 (H) 09/24/2014    Past Medical History:  Diagnosis Date   Amyloidosis ([place])    Anemia of chronic disease    Anxiety    CAD (coronary artery disease)    a. cardiac cath on 11/25/17 with occlusion of the RCA which could have been his event but the RCA could not be engaged. The RCA filled distally from left to right collaterals. There were no severe stenoses in the left coronary system. Medical therapy recommended, started on plavix.   Cancer [facility])    multiple myeloma   Chronic combined systolic and diastolic CHF (congestive heart failure) ([place])    a. EF dropped to 35-40% with grade 2 DD by echo 11/2017.   DJD (degenerative joint disease), lumbar    Dysrhythmia    afib   ESRD (end stage renal disease) ([place])    ESRD- HEMO MWF- [address]   GERD (gastroesophageal reflux disease)    Heart murmur    Hyperlipidemia    Hypertension    Myocardial infarction (H[facility])    Neuropathy    Peripheral vascular disease (H[facility])    Pneumonia    PONV (postoperative nausea and vomiting)    Restless legs  S/P TAVR (transcatheter aortic valve replacement) 10/21/2018   26 mm Edwards Sapien 3 Ultra transcatheter heart valve placed via percutaneous right transfemoral approach    Severe aortic stenosis    severe    Past Surgical History:  Procedure Laterality Date   ABDOMINAL AORTOGRAM W/LOWER EXTREMITY Bilateral 07/16/2018   Procedure: ABDOMINAL AORTOGRAM W/LOWER EXTREMITY;  Surgeon: Wellington Hampshire, MD;  Location: [facility];  Service: Cardiovascular;  Laterality: Bilateral;   ABDOMINAL AORTOGRAM W/LOWER EXTREMITY N/A 04/09/2019   Procedure:  ABDOMINAL AORTOGRAM W/LOWER EXTREMITY - Right Lower;  Surgeon: Waynetta Sandy, MD;  Location: [facility];  Service: Cardiovascular;  Laterality: N/A;   AMPUTATION Left 08/19/2018   Procedure: AMPUTATION LEFT TOES SECOND AND THIRD;  Surgeon: Waynetta Sandy, MD;  Location: [place];  Service: Vascular;  Laterality: Left;   AMPUTATION Right 05/07/2019   Procedure: AMPUTATION BELOW KNEE;  Surgeon: Serafina Mitchell, MD;  Location: [place];  Service: Vascular;  Laterality: Right;   AV FISTULA PLACEMENT Right 12/27/2015   Procedure: Right Arm ARTERIOVENOUS (AV) FISTULA CREATION;  Surgeon: Angelia Mould, MD;  Location: [place];  Service: Vascular;  Laterality: Right;   CARDIAC CATHETERIZATION     CARDIAC VALVE REPLACEMENT     CATARACT EXTRACTION W/ INTRAOCULAR LENS  IMPLANT, BILATERAL     COLONOSCOPY     ERCP W/ SPHICTEROTOMY     EYE SURGERY     FEMORAL-POPLITEAL BYPASS GRAFT Left 07/22/2018   Procedure: left FEMORAL TO BELOW KNEE POPLITEAL ARTERY BYPASS GREAFT using 69mm removable ring propaten gore graft;  Surgeon: Waynetta Sandy, MD;  Location: [place];  Service: Vascular;  Laterality: Left;   FEMORAL-POPLITEAL BYPASS GRAFT Right 04/10/2019   Procedure: BYPASS GRAFT RIGHT FEMORAL-POPLITEAL ARTERY;  Surgeon: Rosetta Posner, MD;  Location: [place];  Service: Vascular;  Laterality: Right;   GANGLION CYST EXCISION Left 11/02/2020   Procedure: EXCISION OF LEFT WRIST DORAL VOLAR MASSES;  Surgeon: Charlotte Crumb, MD;  Location: [place];  Service: Orthopedics;  Laterality: Left;  CPT 25073   I & D EXTREMITY Right 05/05/2019   Procedure: Revision of  RIGHT  FOOT Amputation;  Surgeon: Waynetta Sandy, MD;  Location: [place];  Service: Vascular;  Laterality: Right;   INTRAOPERATIVE TRANSTHORACIC ECHOCARDIOGRAM N/A 10/21/2018   Procedure: Intraoperative Transthoracic Echocardiogram;  Surgeon: Sherren Mocha, MD;  Location: [place];  Service: Open Heart Surgery;   Laterality: N/A;   IR GENERIC HISTORICAL  01/27/2016   IR FLUORO GUIDE CV LINE RIGHT 01/27/2016 Arne Cleveland, MD [facility]   IR GENERIC HISTORICAL  01/27/2016   IR US GUIDE VASC ACCESS RIGHT 01/27/2016 Arne Cleveland, MD [facility]   LAPAROSCOPIC CHOLECYSTECTOMY  03/2011   LEFT HEART CATH AND CORONARY ANGIOGRAPHY N/A 11/25/2017   Procedure: LEFT HEART CATH AND CORONARY ANGIOGRAPHY;  Surgeon: Martinique, Peter M, MD;  Location: [place] [facility];  Service: Cardiovascular;  Laterality: N/A;   LEFT HEART CATH AND CORONARY ANGIOGRAPHY N/A 07/24/2018   Procedure: LEFT HEART CATH AND CORONARY ANGIOGRAPHY;  Surgeon: Sherren Mocha, MD;  Location: [place] [facility];  Service: Cardiovascular;  Laterality: N/A;   RENAL BIOPSY, PERCUTANEOUS Right 09/08/2014   RIGHT/LEFT HEART CATH AND CORONARY ANGIOGRAPHY N/A 03/25/2018   Procedure: RIGHT/LEFT HEART CATH AND CORONARY ANGIOGRAPHY;  Surgeon: Martinique, Peter M, MD;  Location: [place] [facility];  Service: Cardiovascular;  Laterality: N/A;   TEE WITHOUT CARDIOVERSION N/A 05/14/2019   Procedure: TRANSESOPHAGEAL ECHOCARDIOGRAM (TEE);  Surgeon: Harrington Challenger,  Carmin Muskrat, MD;  Location: [facility]OSCOPY;  Service: Cardiovascular;  Laterality: N/A;   TOE AMPUTATION Left 08/19/2018   2nd & 3rd toes   TONSILLECTOMY  ~ 1960   TRANSCATHETER AORTIC VALVE REPLACEMENT, TRANSFEMORAL N/A 10/21/2018   Procedure: TRANSCATHETER AORTIC VALVE REPLACEMENT, TRANSFEMORAL;  Surgeon: Sherren Mocha, MD;  Location: [place];  Service: Open Heart Surgery;  Laterality: N/A;   TRANSMETATARSAL AMPUTATION Right 04/29/2019   Procedure: RIGHT TRANSMETATARSAL AMPUTATION;  Surgeon: Waynetta Sandy, MD;  Location: [place];  Service: Vascular;  Laterality: Right;   ULTRASOUND GUIDANCE FOR VASCULAR ACCESS  03/25/2018   Procedure: Ultrasound Guidance For Vascular Access;  Surgeon: Martinique, Peter M, MD;  Location: [place] [facility];  Service: Cardiovascular;;     Medications:  Outpatient  Encounter Medications as of 02/17/2021  Medication Sig   acetaminophen (TYLENOL) 325 MG tablet Take 1-2 tablets (325-650 mg total) by mouth every 4 (four) hours as needed for mild pain (or temp >/= 101 F).   albuterol (VENTOLIN HFA) 108 (90 Base) MCG/ACT inhaler Inhale 1 puff into the lungs every 6 hours as needed   amiodarone (PACERONE) 200 MG tablet TAKE 1 TABLET (200 MG TOTAL) BY MOUTH DAILY.   amLODipine (NORVASC) 10 MG tablet Take 1 tablet (10 mg total) by mouth at bedtime.   atorvastatin (LIPITOR) 80 MG tablet TAKE 1 TABLET BY MOUTH DAILY WITH SUPPER   B Complex-C-Folic Acid (RENA-VITE RX) 1 MG TABS Take 1 tablet by mouth once a day   clopidogrel (PLAVIX) 75 MG tablet TAKE 1 TABLET BY MOUTH DAILY WITH SUPPER   Darbepoetin Alfa (ARANESP) 100 MCG/0.5ML SOSY injection Inject 0.5 mLs (100 mcg total) into the vein every Friday with hemodialysis.   docusate sodium (COLACE) 100 MG capsule Take 1 capsule (100 mg total) by mouth 2 (two) times daily. (Patient taking differently: Take 100 mg by mouth daily as needed for moderate constipation.)   ethyl chloride spray Apply 1 spray to dialysis access prior to dialysis as directed.   iron sucrose in sodium chloride 0.9 % 100 mL Iron Sucrose (Venofer)   lanthanum (FOSRENOL) 1000 MG chewable tablet Chew 2,000 mg by mouth 3 (three) times daily.   LORazepam (ATIVAN) 1 MG tablet Take 1 tablet (1 mg total) by mouth 2 (two) times daily as needed for anxiety.   Methoxy PEG-Epoetin Beta (MIRCERA IJ) Mircera   multivitamin (RENA-VIT) TABS tablet Take 1 tablet by mouth at bedtime.   ondansetron (ZOFRAN) 4 MG tablet TAKE 1 TABLET BY MOUTH TWICE A DAY AS NEEDED   oxyCODONE-acetaminophen (PERCOCET) 5-325 MG tablet Take 1 tablet by mouth every 4 (four) hours as needed for severe pain.   oxyCODONE-acetaminophen (PERCOCET) 5-325 MG tablet Take by mouth every 4 (four) hours as needed for severe pain.   pantoprazole (PROTONIX) 40 MG tablet Take 1 tablet (40 mg total) by  mouth daily.   vitamin B-12 (CYANOCOBALAMIN) 1000 MCG tablet Take 1 tablet (1,000 mcg total) by mouth every evening.   [DISCONTINUED] clopidogrel (PLAVIX) 75 MG tablet Take 1 tablet by mouth once a day with supper   No facility-administered encounter medications on file as of 02/17/2021.    Allergies: No Known Allergies  Family History: Family History  Problem Relation Age of Onset   Hypertension Mother    Diabetes Mother    Heart disease Mother    Skin cancer Mother    Hypertension Father    Diabetes Father    Diabetes Sister     Social History:  Social History   Tobacco Use   Smoking status: Former    Packs/day: 2.00    Years: 40.00    Pack years: 80.00    Types: Cigarettes    Quit date: 03/19/2011    Years since quitting: 9.9   Smokeless tobacco: Never  Vaping Use   Vaping Use: Never used  Substance Use Topics   Alcohol use: No    Alcohol/week: 0.0 standard drinks   Drug use: No    Comment:  "I have used marijuana till the 1990's". Notes that he quit 15 yrs ago   Social History   Social History Narrative   Pt lives alone in a 1 story home   Has 3 adult daughters   Highest level of education: associates degree   Worked in [employer], broadcasting/film/video.    Left Handed    Lives in a one story home         Vital Signs:  BP (!) 205/65   Pulse 86   Ht $R'5\' 10"'cY$  (1.778 m)   SpO2 94%   BMI 22.24 kg/m   Neurological Exam: MENTAL STATUS including orientation to time, place, person, recent and remote memory, attention span and concentration, language, and fund of knowledge is normal.  Speech is not dysarthric.  CRANIAL NERVES: II:  No visual field defects.    III-IV-VI: Pupils equal round and reactive to light.  Normal conjugate, extra-ocular eye movements in all directions of gaze.  No nystagmus.  No ptosis.   V:  Normal facial sensation.    VII:  Normal facial symmetry and movements.   VIII:  Normal hearing and vestibular function.   IX-X:  Normal palatal movement.    XI:  Normal shoulder shrug and head rotation.   XII:  Normal tongue strength and range of motion, no deviation or fasciculation.  MOTOR:  Right forearm with AV fistula. Left hand with clawhand.  Right BKA, left 2nd and 3rd toe amputation.  Left foot has several areas of oozing and bleeding, which was cleaned and dressed. Mild bilateral hand atrophy, no fasciculations or abnormal movements.  No pronator drift.   Upper Extremity:  Right  Left  Deltoid  5/5   5/5   Biceps  5/5   5/5   Triceps  5/5   5/5   Wrist extensors  4/5   4/5   Wrist flexors  4/5   4/5   Finger extensors  4/5   3/5   Finger flexors  4/5   3/5   Dorsal interossei  4/5   3/5   Abductor pollicis  4/5   4/5   Tone (Ashworth scale)  0  0   Lower Extremity:  Right  Left  Hip flexors  5/5   5/5   Knee flexors    5/5   Knee extensors    4/5   Dorsiflexors    45   Plantarflexors    4/5   Toe extensors    3/5   Toe flexors    3/5   Tone (Ashworth scale)  0  0   MSRs:  Right        Left                  brachioradialis 2+  2+  biceps 2+  2+  triceps 1+  1+  patellar 0  0  ankle jerk 0  0  Hoffman no  no  plantar response down  down   SENSORY:  Vibration is  absent at the MCP, left knee and left toe. Vibration intact at the elbow. Temperature and pinprick reduced in the hands and legs below the knee.  COORDINATION/GAIT: Normal finger-to- nose-finger.  Intact rapid alternating movements bilaterally.  Gait not tested, pt wheelchair bound.    IMPRESSION:  Bilateral hand paresthesias and weakness due to progression of peripheral neuropathy and overlapping suspected left ulnar neuropathy (asymmetric left hand weakness with clawhand deformity).   - NCS/EMG of the arms offered to better characterize symptoms, patient declined since it may not change management. Discussed that if he has neuropathy, unfortunately, there is no treatment. For ulnar neuropathy, decompression of the nerve is option but I do not think he would  be a surgical candidate.  He also is not interested in surgery. Management is supportive  - Strategies to minimize nerve compression across the left elbow discussed  - Start out-patient occupational therapy for hand strengthening and hand assist devices  2.  Left foot abrasion.  Patient reports injuring his foot this morning.  - Cleaned and dressed, patient instructed to monitor closely for healing and follow-up with PCP, if it does not heal  3.  Multifactorial severe peripheral neuropathy due to DM, ESRD, PAD s/p R BKA and Left 2nd and 3rd toe amputation, pt non-ambulatory with sensorimotor deficits in the left lower extremity and bilateral hands  Return to clinic in as needed    Thank you for allowing me to participate in patient's care.  If I can answer any additional questions, I would be pleased to do so.    Sincerely,    Lalena Salas K. Posey Pronto, DO

## 2021-02-23 ENCOUNTER — Encounter: Payer: Medicare Other | Admitting: Occupational Therapy

## 2021-02-23 ENCOUNTER — Other Ambulatory Visit: Payer: Self-pay

## 2021-02-23 ENCOUNTER — Ambulatory Visit: Payer: Medicare Other | Attending: Neurology | Admitting: Occupational Therapy

## 2021-02-23 ENCOUNTER — Telehealth: Payer: Self-pay

## 2021-02-23 DIAGNOSIS — R208 Other disturbances of skin sensation: Secondary | ICD-10-CM | POA: Diagnosis present

## 2021-02-23 DIAGNOSIS — R278 Other lack of coordination: Secondary | ICD-10-CM | POA: Diagnosis present

## 2021-02-23 DIAGNOSIS — R293 Abnormal posture: Secondary | ICD-10-CM | POA: Insufficient documentation

## 2021-02-23 DIAGNOSIS — G629 Polyneuropathy, unspecified: Secondary | ICD-10-CM

## 2021-02-23 DIAGNOSIS — M79641 Pain in right hand: Secondary | ICD-10-CM | POA: Diagnosis present

## 2021-02-23 DIAGNOSIS — M6281 Muscle weakness (generalized): Secondary | ICD-10-CM

## 2021-02-23 DIAGNOSIS — R2681 Unsteadiness on feet: Secondary | ICD-10-CM | POA: Diagnosis present

## 2021-02-23 DIAGNOSIS — M79642 Pain in left hand: Secondary | ICD-10-CM | POA: Diagnosis present

## 2021-02-23 NOTE — Telephone Encounter (Signed)
Hello Dr. Posey Pronto,   Thank you for referring this patient to [place] for O.T. evaluation; we have much to work on.  I also think he would benefit from P.T. to increase safety w/ transfers and potential walking short distances w/ prosthesis, as he reports he was doing some until 2 months ago (although ? accuracy of pt report). I also have so much to work on with UE strength, coordination, and ADLS, that I won't be able to address it all as extensively. If you agree, please send P.T. referral via Epic to Outpatient Neuro.   Also, he has little active shoulder ROM bilaterally. Is this a result of AL amyloidosis or something else?   Thank you,   Redmond Baseman, OTR/L

## 2021-02-23 NOTE — Therapy (Signed)
[facility] [address] [place], [place], [address] Phone: [phone]   Fax:  [phone]  Occupational Therapy Evaluation  Patient Details  Name: Jose Garrett. MRN: [id number] Date of Birth: 30-Apr-1950 Referring Provider (OT): Dr. Narda Amber   Encounter Date: 02/23/2021   OT End of Session - 02/23/21 1318     Visit Number 1    Number of Visits 17    Date for OT Re-Evaluation 05/09/21    Authorization Type UHC MCR    OT Start Time 1105    OT Stop Time 1210    OT Time Calculation (min) 65 min    Activity Tolerance Patient limited by fatigue    Behavior During Therapy [facility] for tasks assessed/performed             Past Medical History:  Diagnosis Date   Amyloidosis ([place])    Anemia of chronic disease    Anxiety    CAD (coronary artery disease)    a. cardiac cath on 11/25/17 with occlusion of the RCA which could have been his event but the RCA could not be engaged. The RCA filled distally from left to right collaterals. There were no severe stenoses in the left coronary system. Medical therapy recommended, started on plavix.   Cancer [facility])    multiple myeloma   Chronic combined systolic and diastolic CHF (congestive heart failure) ([place])    a. EF dropped to 35-40% with grade 2 DD by echo 11/2017.   DJD (degenerative joint disease), lumbar    Dysrhythmia    afib   ESRD (end stage renal disease) (HCC)    ESRD- HEMO MWF- [address]   GERD (gastroesophageal reflux disease)    Heart murmur    Hyperlipidemia    Hypertension    Myocardial infarction (HCC)    Neuropathy    Peripheral vascular disease (HCC)    Pneumonia    PONV (postoperative nausea and vomiting)    Restless legs    S/P TAVR (transcatheter aortic valve replacement) 10/21/2018   26 mm Edwards Sapien 3 Ultra transcatheter heart valve placed via percutaneous right transfemoral approach    Severe aortic stenosis    severe    Past  Surgical History:  Procedure Laterality Date   ABDOMINAL AORTOGRAM W/LOWER EXTREMITY Bilateral 07/16/2018   Procedure: ABDOMINAL AORTOGRAM W/LOWER EXTREMITY;  Surgeon: Wellington Hampshire, MD;  Location: [place] [facility];  Service: Cardiovascular;  Laterality: Bilateral;   ABDOMINAL AORTOGRAM W/LOWER EXTREMITY N/A 04/09/2019   Procedure: ABDOMINAL AORTOGRAM W/LOWER EXTREMITY - Right Lower;  Surgeon: Waynetta Sandy, MD;  Location: [place] [facility];  Service: Cardiovascular;  Laterality: N/A;   AMPUTATION Left 08/19/2018   Procedure: AMPUTATION LEFT TOES SECOND AND THIRD;  Surgeon: Waynetta Sandy, MD;  Location: [facility];  Service: Vascular;  Laterality: Left;   AMPUTATION Right 05/07/2019   Procedure: AMPUTATION BELOW KNEE;  Surgeon: Serafina Mitchell, MD;  Location: [facility];  Service: Vascular;  Laterality: Right;   AV FISTULA PLACEMENT Right 12/27/2015   Procedure: Right Arm ARTERIOVENOUS (AV) FISTULA CREATION;  Surgeon: Angelia Mould, MD;  Location: [facility];  Service: Vascular;  Laterality: Right;   CARDIAC CATHETERIZATION     CARDIAC VALVE REPLACEMENT     CATARACT EXTRACTION W/ INTRAOCULAR LENS  IMPLANT, BILATERAL     COLONOSCOPY     ERCP W/ SPHICTEROTOMY     EYE SURGERY     FEMORAL-POPLITEAL BYPASS GRAFT Left 07/22/2018  Procedure: left FEMORAL TO BELOW KNEE POPLITEAL ARTERY BYPASS GREAFT using 11mm removable ring propaten gore graft;  Surgeon: Waynetta Sandy, MD;  Location: [facility];  Service: Vascular;  Laterality: Left;   FEMORAL-POPLITEAL BYPASS GRAFT Right 04/10/2019   Procedure: BYPASS GRAFT RIGHT FEMORAL-POPLITEAL ARTERY;  Surgeon: Rosetta Posner, MD;  Location: [facility];  Service: Vascular;  Laterality: Right;   GANGLION CYST EXCISION Left 11/02/2020   Procedure: EXCISION OF LEFT WRIST DORAL VOLAR MASSES;  Surgeon: Charlotte Crumb, MD;  Location: [facility];  Service: Orthopedics;  Laterality: Left;  CPT 25073   I & D EXTREMITY Right 05/05/2019    Procedure: Revision of  RIGHT  FOOT Amputation;  Surgeon: Waynetta Sandy, MD;  Location: [facility];  Service: Vascular;  Laterality: Right;   INTRAOPERATIVE TRANSTHORACIC ECHOCARDIOGRAM N/A 10/21/2018   Procedure: Intraoperative Transthoracic Echocardiogram;  Surgeon: Sherren Mocha, MD;  Location: [facility];  Service: Open Heart Surgery;  Laterality: N/A;   IR GENERIC HISTORICAL  01/27/2016   IR FLUORO GUIDE CV LINE RIGHT 01/27/2016 Arne Cleveland, MD [facility]   IR GENERIC HISTORICAL  01/27/2016   IR US GUIDE VASC ACCESS RIGHT 01/27/2016 Arne Cleveland, MD [facility]   LAPAROSCOPIC CHOLECYSTECTOMY  03/2011   LEFT HEART CATH AND CORONARY ANGIOGRAPHY N/A 11/25/2017   Procedure: LEFT HEART CATH AND CORONARY ANGIOGRAPHY;  Surgeon: Martinique, Peter M, MD;  Location: [place] [facility];  Service: Cardiovascular;  Laterality: N/A;   LEFT HEART CATH AND CORONARY ANGIOGRAPHY N/A 07/24/2018   Procedure: LEFT HEART CATH AND CORONARY ANGIOGRAPHY;  Surgeon: Sherren Mocha, MD;  Location: [place] [facility];  Service: Cardiovascular;  Laterality: N/A;   RENAL BIOPSY, PERCUTANEOUS Right 09/08/2014   RIGHT/LEFT HEART CATH AND CORONARY ANGIOGRAPHY N/A 03/25/2018   Procedure: RIGHT/LEFT HEART CATH AND CORONARY ANGIOGRAPHY;  Surgeon: Martinique, Peter M, MD;  Location: [place] [facility];  Service: Cardiovascular;  Laterality: N/A;   TEE WITHOUT CARDIOVERSION N/A 05/14/2019   Procedure: TRANSESOPHAGEAL ECHOCARDIOGRAM (TEE);  Surgeon: Fay Records, MD;  Location: [facility]OSCOPY;  Service: Cardiovascular;  Laterality: N/A;   TOE AMPUTATION Left 08/19/2018   2nd & 3rd toes   TONSILLECTOMY  ~ 1960   TRANSCATHETER AORTIC VALVE REPLACEMENT, TRANSFEMORAL N/A 10/21/2018   Procedure: TRANSCATHETER AORTIC VALVE REPLACEMENT, TRANSFEMORAL;  Surgeon: Sherren Mocha, MD;  Location: [facility];  Service: Open Heart Surgery;  Laterality: N/A;   TRANSMETATARSAL AMPUTATION Right 04/29/2019   Procedure: RIGHT TRANSMETATARSAL  AMPUTATION;  Surgeon: Waynetta Sandy, MD;  Location: [facility];  Service: Vascular;  Laterality: Right;   ULTRASOUND GUIDANCE FOR VASCULAR ACCESS  03/25/2018   Procedure: Ultrasound Guidance For Vascular Access;  Surgeon: Martinique, Peter M, MD;  Location: [place] [place] [facility];  Service: Cardiovascular;;    There were no vitals filed for this visit.   Subjective Assessment - 02/23/21 1115     Patient is accompanied by: Family member   daughter   Currently in Pain? Yes    Pain Score 7     Pain Location Hand    Pain Orientation Left;Right    Pain Descriptors / Indicators Burning    Pain Type Chronic pain    Pain Onset More than a month ago    Pain Frequency Intermittent    Aggravating Factors  exertion    Pain Relieving Factors oxycodone               [facility] OT Assessment - 02/23/21 0001       Assessment  Medical Diagnosis weakness of both hands, neuropathy   Pt also w/ AL amyloidosis complicated by ESRD   Referring Provider (OT) Dr. Narda Amber    Onset Date/Surgical Date --   approx 4 months ago but getting worse last 2 months w/ hands, UE strength,  overall fatigue and decreased mobility   Hand Dominance Left    Prior Therapy no O.T.  for this   per pt report     Precautions   Precaution Comments fistula RUE, dialysis M/W/F, bovine valve, Rt BKA, No driving      Balance Screen   Has the patient fallen in the past 6 months Yes    How many times? 4   last month     Home  Environment   Bathroom Shower/Tub Psychiatric nurse Tub bench;Wheelchair - manual;Bedside commode;Walker - 2 wheels    Additional Comments Pt lives on 1st floor apt w/ daughter, ramped entrance, 2" threshhold to enter. Pt/daughter reports small w/ decreased ability to maneuver w/c and are currently looking for more w/c accessible apt    Lives With Daughter      Prior Function   Level of Independence Independent with basic ADLs    Vocation Retired       ADL   Eating/Feeding --   Daughter has started feeding him because he's utensil dropping so much d/t neuropathy   Grooming --   not brushing teeth per dtr report, assist needed for proper hygiene care   Upper Body Bathing --   sponge bathes mostly, but not well - pt very private (seating edge of tub 1x/wk for showering which is not safe and therapist recommended using tub transfer bench which he already has)   Lower Body Bathing --   see above details (in upper body bathing)   Upper Body Dressing Moderate assistance   unable to fully get shirt on/off head   Lower Body Dressing Maximal assistance   wears elastic pants, dependent for shoes/socks   Toilet Transfer Modified independent   however not safe - unable to get close enough to toilet and has had some falls w/ this. Recommended using BSC in bedroom to close distance   Toileting - Clothing Manipulation Modified independent   but w/ great difficulty and occasional assist to get pants back up   [place]   difficulty and daughter concerned about hygiene as pt is not doing sufficiently   Tub/Shower Transfer --   pt mostly sponge bathing, but sitting edge of tub 1x/wk   ADL comments Pt overall not safe with transfers,  decreased hand function d/t neuropathy, decreased UB strength to assist w/ transfers and little to no shoulder ROM      IADL   Shopping Completely unable to shop    Light Housekeeping Performs light daily tasks but cannot maintain acceptable level of cleanliness   from w/c level (sweeping)   Meal Prep Able to complete simple cold meal and snack prep;Able to complete simple warm meal prep   from a w/c level   Community Mobility Relies on family or friends for transportation    Medication Management Has difficulty remembering to take medication   occasional supervision     Mobility   Mobility Status Needs assist    Mobility Status Comments has stopped walking w/ walker last 2 months, steady decline  last 6 months, falling w/ transfers      Written Expression  Dominant Hand Left    Handwriting --   unable to pick up pen or hold pen     Vision - History   Baseline Vision Wears glasses only for reading    Additional Comments cataract surgery both eyes      Activity Tolerance   Activity Tolerance --   quickly fatigues d/t multiple medical history     Cognition   Cognition Comments fluctuating historian, dialysis and pain affects cognition, brain fatigue      Observation/Other Assessments   Observations Pt has open but dry would over Lt dorsal index PIP joint. Pt w/ muscle spasms t/o eval w/ significant pain at times      Sensation   Light Touch Impaired by gross assessment    Additional Comments pt can detect light touch bilateral hands w/ effort if focused, but diminished      Coordination   Coordination unable to pick up pen bilaterally      Edema   Edema Lt hand mild compared to Rt      ROM / Strength   AROM / PROM / Strength AROM;PROM      AROM   Overall AROM Comments minimal shoulder movement bilaterally, elbows distally 90% ROM w/ effort and difficulty w/ finger extension and supination, decreased ability to open 4th and 5th fingers Lt hand but no clawing noted      PROM   Overall PROM Comments Pt has approx 75% passive shoulder ROM in flex and abd bilaterally      Hand Function   Right Hand Grip (lbs) 12.3 lbs   going into wrist flexion   Left Hand Grip (lbs) 19.1 lbs   going into wrist flex                             OT Education - 02/23/21 1316     Education Details eval results, O.T. goals, recommendations of DME (tub transfer bench and BSC) for showering and toileting    Person(s) Educated Patient;Child(ren)   adult daughter   Methods Explanation    Comprehension Verbalized understanding              OT Short Term Goals - 02/23/21 1337       OT SHORT TERM GOAL #1   Title Pt to be mod I for feeding self and perineal care w/  toileting using A/E prn    Time 4    Period Weeks    Status New      OT SHORT TERM GOAL #2   Title Pt to verbalize understanding with proper use of DME for safety with toilet and tub transfers    Time 4    Period Weeks    Status New      OT SHORT TERM GOAL #3   Title Pt to verbalize understanding with safety considerations and strategies due to decreased sensation bilateral hands    Time 4    Period Weeks    Status New      OT SHORT TERM GOAL #4   Title Pt/daughter to verbalize understanding with necessary A/E and DME to increase safety, ease, and independence with BADLS (particularly eating, grooming, toileting, as well as loofah or bath mitt and LH sponge? for bathing)    Time 4    Period Weeks    Status New      OT SHORT TERM GOAL #5   Title Pt to verbalize understanding with energy  conservation techniques    Time 4    Period Weeks    Status New               OT Long Term Goals - 02/23/21 1343       OT LONG TERM GOAL #1   Title Pt to increase bilateral grip strength by 8 lbs    Baseline Rt = 12 lbs, Lt = 19 lbs    Time 8    Period Weeks    Status New      OT LONG TERM GOAL #2   Title Pt to improve coordination as evidenced by being able to pick up smaller objects bilateral hands consistently w/ min drops    Time 8    Period Weeks    Status New      OT LONG TERM GOAL #3   Title Pt to demo bilateral shoulder ROM to 50* or higher for low level reaching and to increase ability to don/doff pull over shirt    Time 8    Period Weeks    Status New      OT LONG TERM GOAL #4   Title Pt to brush teeth w/ A/E for proper dental hygiene care    Time 8    Period Weeks    Status New      OT LONG TERM GOAL #5   Title Pt to safely transfer at mod I level consistently for shower/tub and toilet transfers    Time 8    Period Weeks    Status New                   Plan - 02/23/21 1319     Clinical Impression Statement Pt is a 70 y.o. male who presents to  [facility] with diagnosis: weakness of both hands and neuropathy. Pt also with signficant medical complications/comorbidities including: AL amyloidosis complicated by ESRD (on dialysis M/W/F), Rt BKA (2021), PAD, CAD, CHF, GERD, HTN, HLD, DDD, multiple myeloma. Pt also fatigues quickly as a result of medical history, and has muscle spasms and back pain. Pt with steady decline over last 6 months w/ worsening decline last 2 months especially in hands and UE function. Pt reports dropping things more and has had decline in feeding self and other BADLS. Pt/daughter also reports he was able to walk short distances with walker and prosthesis up until last 2 months, and daughter reports decreased safety with falls during transfers (P.T. referral has been requested). Pt would benefit from O.T. to address safety w/ functional transfers and lack of sensation bilateral hands, A/E recommendations, UE conditioning, shoulder ROM, and bilateral hand function and strength.    OT Occupational Profile and History Comprehensive Assessment- Review of records and extensive additional review of physical, cognitive, psychosocial history related to current functional performance    Occupational performance deficits (Please refer to evaluation for details): ADL's;IADL's;Social Participation    Body Structure / Function / Physical Skills ADL;Strength;Dexterity;Balance;Pain;UE functional use;Edema;Body mechanics;Endurance;IADL;ROM;Flexibility;Coordination;Mobility;FMC;Decreased knowledge of precautions;Decreased knowledge of use of DME    Rehab Potential Fair   complex medical history, decreased endurance   Clinical Decision Making Several treatment options, min-mod task modification necessary    Comorbidities Affecting Occupational Performance: Presence of comorbidities impacting occupational performance    Comorbidities impacting occupational performance description: AL amyloidosis, Rt BKA, ESRD on dialysis    Modification or Assistance to  Complete Evaluation  Min-Moderate modification of tasks or assist with assess necessary to complete eval    OT  Frequency 2x / week    OT Duration 8 weeks   over 10 week period due to potential scheduling conflicts and transportation issue   OT Treatment/Interventions Self-care/ADL training;DME and/or AE instruction;Splinting;Therapeutic activities;Therapeutic exercise;Coping strategies training;Neuromuscular education;Functional Mobility Training;Passive range of motion;Manual Therapy;Patient/family education;Energy conservation;Moist Heat    Plan schedule P.T. evaluation, assess [place] [employer] [place] & add goal prn, education and handout on DME recommendations, safety considerations d/t lack of sensation bilateral hands, A/E for feeding self and perineal hygiene (recommend bidet)    Consulted and Agree with Plan of Care Patient;Family member/caregiver    Family Member Consulted adult daughter             Patient will benefit from skilled therapeutic intervention in order to improve the following deficits and impairments:   Body Structure / Function / Physical Skills: ADL, Strength, Dexterity, Balance, Pain, UE functional use, Edema, Body mechanics, Endurance, IADL, ROM, Flexibility, Coordination, Mobility, FMC, Decreased knowledge of precautions, Decreased knowledge of use of DME       Visit Diagnosis: Muscle weakness (generalized)  Unsteadiness on feet  Other lack of coordination  Abnormal posture  Other disturbances of skin sensation  Pain in left hand  Pain in right hand    Problem List Patient Active Problem List   Diagnosis Date Noted   Altered mental status 70/62/3762   Acute metabolic encephalopathy 83/15/1761   Mixed hyperlipidemia 11/04/2020   Volume overload 10/16/2020   Acute hypoxemic respiratory failure ([place]) 10/16/2020   Hypertensive urgency 10/16/2020   Pain due to onychomycosis of toenail of left foot 08/30/2020   Lumbar spondylosis 01/23/2020   Neuropathy  10/06/2019   Gait abnormality 08/06/2019   Back muscle spasm 07/09/2019   Sleep disturbance    Nausea without vomiting    Orthostatic hypotension    Acute on chronic anemia    Chronic combined systolic and diastolic CHF (congestive heart failure) (HCC)    ESRD on dialysis ([place])    Right below-knee amputee ([place]) 05/18/2019   Pressure injury of skin 05/14/2019   Enterococcal bacteremia 05/13/2019   Headache, unspecified 01/12/2019   Allergy, unspecified, initial encounter 12/29/2018   Polymorphic ventricular tachycardia 10/24/2018   S/P TAVR (transcatheter aortic valve replacement) 10/21/2018   Anxiety    GERD without esophagitis    Coronary artery disease involving native coronary artery of native heart without angina pectoris    Thrombocytopenia (HCC)    PAD (peripheral artery disease) ([place]) 07/22/2018   Acute on chronic combined systolic and diastolic CHF (congestive heart failure) ([place]) 11/30/2017   Hypercalcemia 06/24/2017   Encounter for removal of sutures 08/15/2016   Other mechanical complication of surgically created arteriovenous fistula, subsequent encounter 06/22/2016   Unspecified protein-calorie malnutrition ([place]) 02/06/2016   Secondary hyperparathyroidism of renal origin ([place]) 02/06/2016   Pruritus, unspecified 02/06/2016   Other specified coagulation defects ([place]) 02/06/2016   Hypokalemia 02/06/2016   Diarrhea, unspecified 60/73/7106   Complication of vascular dialysis catheter 02/06/2016   Aftercare including intermittent dialysis ([place]) 02/06/2016   ESRD (end stage renal disease) on dialysis ([place])    Iron deficiency anemia 11/22/2015   AL amyloidosis ([place]) 09/25/2014   Anemia of chronic disease 09/25/2014   Essential hypertension, benign 11/06/2013    Carey Bullocks, OTR/L 02/23/2021, 1:49 PM  [facility] [address] [place], [place], [address] Phone: [phone]   Fax:   [phone]  Name: Jose Garrett. MRN: [id number] Date of Birth: [birth year]

## 2021-02-23 NOTE — Telephone Encounter (Signed)
PT referral will be sent as requested.  Regarding his shoulder ROM, he may have underling shoulder pathology.  If he does not make progress with OT, please let me know and I can to look into whether cervical radiculopathy could be contributing with imaging.

## 2021-02-27 ENCOUNTER — Encounter (HOSPITAL_COMMUNITY): Payer: Self-pay

## 2021-02-27 ENCOUNTER — Other Ambulatory Visit: Payer: Self-pay

## 2021-02-27 ENCOUNTER — Inpatient Hospital Stay (HOSPITAL_COMMUNITY)
Admission: EM | Admit: 2021-02-27 | Discharge: 2021-03-24 | DRG: 471 | Disposition: A | Discharge status: Skilled Nursing Facility | Payer: Medicare Other | Attending: Internal Medicine | Admitting: Internal Medicine

## 2021-02-27 ENCOUNTER — Emergency Department (HOSPITAL_COMMUNITY): Payer: Medicare Other

## 2021-02-27 ENCOUNTER — Telehealth: Payer: Self-pay

## 2021-02-27 DIAGNOSIS — I5042 Chronic combined systolic (congestive) and diastolic (congestive) heart failure: Secondary | ICD-10-CM | POA: Diagnosis present

## 2021-02-27 DIAGNOSIS — I482 Chronic atrial fibrillation, unspecified: Secondary | ICD-10-CM | POA: Diagnosis present

## 2021-02-27 DIAGNOSIS — Z952 Presence of prosthetic heart valve: Secondary | ICD-10-CM

## 2021-02-27 DIAGNOSIS — N2581 Secondary hyperparathyroidism of renal origin: Secondary | ICD-10-CM | POA: Diagnosis present

## 2021-02-27 DIAGNOSIS — I252 Old myocardial infarction: Secondary | ICD-10-CM

## 2021-02-27 DIAGNOSIS — M4802 Spinal stenosis, cervical region: Secondary | ICD-10-CM

## 2021-02-27 DIAGNOSIS — Z95820 Peripheral vascular angioplasty status with implants and grafts: Secondary | ICD-10-CM

## 2021-02-27 DIAGNOSIS — Z89511 Acquired absence of right leg below knee: Secondary | ICD-10-CM

## 2021-02-27 DIAGNOSIS — M4800 Spinal stenosis, site unspecified: Secondary | ICD-10-CM | POA: Diagnosis present

## 2021-02-27 DIAGNOSIS — M4712 Other spondylosis with myelopathy, cervical region: Secondary | ICD-10-CM | POA: Diagnosis not present

## 2021-02-27 DIAGNOSIS — Z79899 Other long term (current) drug therapy: Secondary | ICD-10-CM

## 2021-02-27 DIAGNOSIS — Z9713 Presence of artificial right leg (complete) (partial): Secondary | ICD-10-CM

## 2021-02-27 DIAGNOSIS — N186 End stage renal disease: Secondary | ICD-10-CM | POA: Diagnosis present

## 2021-02-27 DIAGNOSIS — Z833 Family history of diabetes mellitus: Secondary | ICD-10-CM

## 2021-02-27 DIAGNOSIS — Z89422 Acquired absence of other left toe(s): Secondary | ICD-10-CM

## 2021-02-27 DIAGNOSIS — G629 Polyneuropathy, unspecified: Secondary | ICD-10-CM | POA: Diagnosis present

## 2021-02-27 DIAGNOSIS — I10 Essential (primary) hypertension: Secondary | ICD-10-CM | POA: Diagnosis present

## 2021-02-27 DIAGNOSIS — M47816 Spondylosis without myelopathy or radiculopathy, lumbar region: Secondary | ICD-10-CM | POA: Diagnosis present

## 2021-02-27 DIAGNOSIS — D638 Anemia in other chronic diseases classified elsewhere: Secondary | ICD-10-CM | POA: Diagnosis present

## 2021-02-27 DIAGNOSIS — Z953 Presence of xenogenic heart valve: Secondary | ICD-10-CM

## 2021-02-27 DIAGNOSIS — I739 Peripheral vascular disease, unspecified: Secondary | ICD-10-CM | POA: Diagnosis present

## 2021-02-27 DIAGNOSIS — C9000 Multiple myeloma not having achieved remission: Secondary | ICD-10-CM | POA: Diagnosis present

## 2021-02-27 DIAGNOSIS — Z992 Dependence on renal dialysis: Secondary | ICD-10-CM

## 2021-02-27 DIAGNOSIS — Z87891 Personal history of nicotine dependence: Secondary | ICD-10-CM

## 2021-02-27 DIAGNOSIS — R1310 Dysphagia, unspecified: Secondary | ICD-10-CM | POA: Diagnosis not present

## 2021-02-27 DIAGNOSIS — Z419 Encounter for procedure for purposes other than remedying health state, unspecified: Secondary | ICD-10-CM

## 2021-02-27 DIAGNOSIS — Z808 Family history of malignant neoplasm of other organs or systems: Secondary | ICD-10-CM

## 2021-02-27 DIAGNOSIS — M48061 Spinal stenosis, lumbar region without neurogenic claudication: Secondary | ICD-10-CM

## 2021-02-27 DIAGNOSIS — I2582 Chronic total occlusion of coronary artery: Secondary | ICD-10-CM | POA: Diagnosis present

## 2021-02-27 DIAGNOSIS — D696 Thrombocytopenia, unspecified: Secondary | ICD-10-CM | POA: Diagnosis present

## 2021-02-27 DIAGNOSIS — I132 Hypertensive heart and chronic kidney disease with heart failure and with stage 5 chronic kidney disease, or end stage renal disease: Secondary | ICD-10-CM | POA: Diagnosis present

## 2021-02-27 DIAGNOSIS — R279 Unspecified lack of coordination: Secondary | ICD-10-CM | POA: Diagnosis present

## 2021-02-27 DIAGNOSIS — Z888 Allergy status to other drugs, medicaments and biological substances status: Secondary | ICD-10-CM

## 2021-02-27 DIAGNOSIS — D631 Anemia in chronic kidney disease: Secondary | ICD-10-CM | POA: Diagnosis present

## 2021-02-27 DIAGNOSIS — G9520 Unspecified cord compression: Secondary | ICD-10-CM | POA: Diagnosis not present

## 2021-02-27 DIAGNOSIS — I350 Nonrheumatic aortic (valve) stenosis: Secondary | ICD-10-CM | POA: Diagnosis present

## 2021-02-27 DIAGNOSIS — F419 Anxiety disorder, unspecified: Secondary | ICD-10-CM | POA: Diagnosis present

## 2021-02-27 DIAGNOSIS — Z7902 Long term (current) use of antithrombotics/antiplatelets: Secondary | ICD-10-CM

## 2021-02-27 DIAGNOSIS — R531 Weakness: Secondary | ICD-10-CM

## 2021-02-27 DIAGNOSIS — E785 Hyperlipidemia, unspecified: Secondary | ICD-10-CM | POA: Diagnosis present

## 2021-02-27 DIAGNOSIS — Z993 Dependence on wheelchair: Secondary | ICD-10-CM

## 2021-02-27 DIAGNOSIS — E859 Amyloidosis, unspecified: Secondary | ICD-10-CM | POA: Diagnosis present

## 2021-02-27 DIAGNOSIS — M2578 Osteophyte, vertebrae: Secondary | ICD-10-CM | POA: Diagnosis present

## 2021-02-27 DIAGNOSIS — Z20822 Contact with and (suspected) exposure to covid-19: Secondary | ICD-10-CM | POA: Diagnosis present

## 2021-02-27 DIAGNOSIS — K5900 Constipation, unspecified: Secondary | ICD-10-CM | POA: Diagnosis present

## 2021-02-27 DIAGNOSIS — Z955 Presence of coronary angioplasty implant and graft: Secondary | ICD-10-CM

## 2021-02-27 DIAGNOSIS — G959 Disease of spinal cord, unspecified: Secondary | ICD-10-CM

## 2021-02-27 DIAGNOSIS — Z8249 Family history of ischemic heart disease and other diseases of the circulatory system: Secondary | ICD-10-CM

## 2021-02-27 DIAGNOSIS — I509 Heart failure, unspecified: Secondary | ICD-10-CM

## 2021-02-27 DIAGNOSIS — G2581 Restless legs syndrome: Secondary | ICD-10-CM | POA: Diagnosis present

## 2021-02-27 DIAGNOSIS — I2510 Atherosclerotic heart disease of native coronary artery without angina pectoris: Secondary | ICD-10-CM | POA: Diagnosis present

## 2021-02-27 DIAGNOSIS — M1990 Unspecified osteoarthritis, unspecified site: Secondary | ICD-10-CM | POA: Diagnosis present

## 2021-02-27 DIAGNOSIS — G8929 Other chronic pain: Secondary | ICD-10-CM | POA: Diagnosis present

## 2021-02-27 DIAGNOSIS — K219 Gastro-esophageal reflux disease without esophagitis: Secondary | ICD-10-CM | POA: Diagnosis present

## 2021-02-27 LAB — COMPREHENSIVE METABOLIC PANEL
ALT: 18 U/L (ref 0–44)
AST: 29 U/L (ref 15–41)
Albumin: 3.6 g/dL (ref 3.5–5.0)
Alkaline Phosphatase: 67 U/L (ref 38–126)
Anion gap: 14 (ref 5–15)
BUN: 23 mg/dL (ref 8–23)
CO2: 29 mmol/L (ref 22–32)
Calcium: 9.1 mg/dL (ref 8.9–10.3)
Chloride: 94 mmol/L — ABNORMAL LOW (ref 98–111)
Creatinine, Ser: 5.12 mg/dL — ABNORMAL HIGH (ref 0.61–1.24)
GFR, Estimated: 12 mL/min — ABNORMAL LOW (ref >60)
Glucose, Bld: 84 mg/dL (ref 70–99)
Potassium: 3.9 mmol/L (ref 3.5–5.1)
Sodium: 137 mmol/L (ref 135–145)
Total Bilirubin: 1.3 mg/dL — ABNORMAL HIGH (ref 0.3–1.2)
Total Protein: 6.9 g/dL (ref 6.5–8.1)

## 2021-02-27 LAB — MAGNESIUM: Magnesium: 2.1 mg/dL (ref 1.7–2.4)

## 2021-02-27 LAB — CBC
HCT: 28.2 % — ABNORMAL LOW (ref 39.0–52.0)
Hemoglobin: 9.2 g/dL — ABNORMAL LOW (ref 13.0–17.0)
MCH: 31.7 pg (ref 26.0–34.0)
MCHC: 32.6 g/dL (ref 30.0–36.0)
MCV: 97.2 fL (ref 80.0–100.0)
Platelets: 128 10*3/uL — ABNORMAL LOW (ref 150–400)
RBC: 2.9 MIL/uL — ABNORMAL LOW (ref 4.22–5.81)
RDW: 15.7 % — ABNORMAL HIGH (ref 11.5–15.5)
WBC: 6.1 10*3/uL (ref 4.0–10.5)
nRBC: 0 % (ref 0.0–0.2)

## 2021-02-27 LAB — CBG MONITORING, ED: Glucose-Capillary: 112 mg/dL — ABNORMAL HIGH (ref 70–99)

## 2021-02-27 MED ORDER — OXYCODONE-ACETAMINOPHEN 5-325 MG PO TABS
1.0000 | ORAL_TABLET | Freq: Once | ORAL | Status: AC
Start: 2021-02-27 — End: 2021-02-27
  Administered 2021-02-27: 19:00:00 1 via ORAL
  Filled 2021-02-27: qty 1

## 2021-02-27 NOTE — ED Notes (Signed)
Jose Garrett wants to be called with updates and if he is discharged. [phone]

## 2021-02-27 NOTE — ED Triage Notes (Addendum)
Pt BIB [facility] from dialysis per family c/o of failure to thrive since sept and not getting better. Per the pt his only complaint is weakness that he states is worse than normal.Pt states the weakness has been going on for weeks.

## 2021-02-27 NOTE — ED Provider Notes (Signed)
Emergency Medicine Provider Triage Evaluation Note  Jose Garrett. , a 70 y.o. male  was evaluated in triage.  Pt complains of generalized weakness.  History given by the patient and his son.  He is a dialysis patient.  He just had dialysis tonight.  He has chronic back pain.  Son-in-law reports that he has had rapid and progressively worsening weakness.  He is unable to hold himself off or walk.  He is using the bathroom on himself and unable to stand to clean himself.  He complains of weakness in his upper and lower extremities.  He is status post right lower extremity amputation.  He complains that he is losing grip strength and has weakness in his upper extremities.  Family states that they are unable to help him transition to a wheelchair at this point.  Review of Systems  Positive: weakness Negative: fever  Physical Exam  BP (!) 144/64 (BP Location: Left Arm)    Pulse 84    Temp 98.1 F (36.7 C) (Oral)    Resp 17    Ht 5\' 10"  (1.778 m)    Wt 73 kg    SpO2 100%    BMI 23.09 kg/m  Gen:   Awake, no distress   Resp:  Normal effort  MSK:   Moves extremities without difficulty  Other:  Appears ill, and lethargic  Medical Decision Making  Medically screening exam initiated at 6:59 PM.  Appropriate orders placed.  [name] was informed that the remainder of the evaluation will be completed by another provider, this initial triage assessment does not replace that evaluation, and the importance of remaining in the ED until their evaluation is complete.  Work-up initiated   Margarita Mail, PA-C 02/27/21 1903    Truddie Hidden, MD 02/27/21 [phone]

## 2021-02-27 NOTE — Telephone Encounter (Signed)
(  2:40 pm) SW scheduled initial palliative care visit with patient. NP to see patient 12/22 @ 11 am.

## 2021-02-28 ENCOUNTER — Emergency Department (HOSPITAL_COMMUNITY): Payer: Medicare Other

## 2021-02-28 ENCOUNTER — Encounter: Payer: Medicare Other | Admitting: Occupational Therapy

## 2021-02-28 DIAGNOSIS — Z8249 Family history of ischemic heart disease and other diseases of the circulatory system: Secondary | ICD-10-CM | POA: Diagnosis not present

## 2021-02-28 DIAGNOSIS — E785 Hyperlipidemia, unspecified: Secondary | ICD-10-CM | POA: Diagnosis present

## 2021-02-28 DIAGNOSIS — Z992 Dependence on renal dialysis: Secondary | ICD-10-CM | POA: Diagnosis not present

## 2021-02-28 DIAGNOSIS — G8929 Other chronic pain: Secondary | ICD-10-CM | POA: Diagnosis present

## 2021-02-28 DIAGNOSIS — I10 Essential (primary) hypertension: Secondary | ICD-10-CM | POA: Diagnosis not present

## 2021-02-28 DIAGNOSIS — I482 Chronic atrial fibrillation, unspecified: Secondary | ICD-10-CM | POA: Diagnosis present

## 2021-02-28 DIAGNOSIS — N186 End stage renal disease: Secondary | ICD-10-CM | POA: Diagnosis present

## 2021-02-28 DIAGNOSIS — N2581 Secondary hyperparathyroidism of renal origin: Secondary | ICD-10-CM | POA: Diagnosis present

## 2021-02-28 DIAGNOSIS — I350 Nonrheumatic aortic (valve) stenosis: Secondary | ICD-10-CM | POA: Diagnosis present

## 2021-02-28 DIAGNOSIS — D638 Anemia in other chronic diseases classified elsewhere: Secondary | ICD-10-CM | POA: Diagnosis not present

## 2021-02-28 DIAGNOSIS — D696 Thrombocytopenia, unspecified: Secondary | ICD-10-CM | POA: Diagnosis present

## 2021-02-28 DIAGNOSIS — C9000 Multiple myeloma not having achieved remission: Secondary | ICD-10-CM | POA: Diagnosis present

## 2021-02-28 DIAGNOSIS — I2582 Chronic total occlusion of coronary artery: Secondary | ICD-10-CM | POA: Diagnosis present

## 2021-02-28 DIAGNOSIS — K219 Gastro-esophageal reflux disease without esophagitis: Secondary | ICD-10-CM | POA: Diagnosis present

## 2021-02-28 DIAGNOSIS — Z993 Dependence on wheelchair: Secondary | ICD-10-CM | POA: Diagnosis not present

## 2021-02-28 DIAGNOSIS — E859 Amyloidosis, unspecified: Secondary | ICD-10-CM | POA: Diagnosis present

## 2021-02-28 DIAGNOSIS — I2510 Atherosclerotic heart disease of native coronary artery without angina pectoris: Secondary | ICD-10-CM | POA: Diagnosis present

## 2021-02-28 DIAGNOSIS — G2581 Restless legs syndrome: Secondary | ICD-10-CM | POA: Diagnosis present

## 2021-02-28 DIAGNOSIS — M4712 Other spondylosis with myelopathy, cervical region: Secondary | ICD-10-CM | POA: Diagnosis present

## 2021-02-28 DIAGNOSIS — D631 Anemia in chronic kidney disease: Secondary | ICD-10-CM | POA: Diagnosis present

## 2021-02-28 DIAGNOSIS — I5042 Chronic combined systolic (congestive) and diastolic (congestive) heart failure: Secondary | ICD-10-CM | POA: Diagnosis present

## 2021-02-28 DIAGNOSIS — G9520 Unspecified cord compression: Secondary | ICD-10-CM | POA: Diagnosis present

## 2021-02-28 DIAGNOSIS — M4800 Spinal stenosis, site unspecified: Secondary | ICD-10-CM | POA: Diagnosis present

## 2021-02-28 DIAGNOSIS — R1310 Dysphagia, unspecified: Secondary | ICD-10-CM | POA: Diagnosis not present

## 2021-02-28 DIAGNOSIS — I132 Hypertensive heart and chronic kidney disease with heart failure and with stage 5 chronic kidney disease, or end stage renal disease: Secondary | ICD-10-CM | POA: Diagnosis present

## 2021-02-28 DIAGNOSIS — Z20822 Contact with and (suspected) exposure to covid-19: Secondary | ICD-10-CM | POA: Diagnosis present

## 2021-02-28 DIAGNOSIS — I739 Peripheral vascular disease, unspecified: Secondary | ICD-10-CM | POA: Diagnosis present

## 2021-02-28 DIAGNOSIS — M4802 Spinal stenosis, cervical region: Secondary | ICD-10-CM | POA: Diagnosis present

## 2021-02-28 MED ORDER — VITAMIN B-12 1000 MCG PO TABS
1000.0000 ug | ORAL_TABLET | Freq: Every evening | ORAL | Status: DC
Start: 2021-02-28 — End: 2021-03-24
  Administered 2021-02-28 – 2021-03-23 (×21): 1000 ug via ORAL
  Filled 2021-02-28 (×23): qty 1

## 2021-02-28 MED ORDER — OXYCODONE-ACETAMINOPHEN 5-325 MG PO TABS: 1.0000 | ORAL_TABLET | ORAL | Status: DC | PRN

## 2021-02-28 MED ORDER — AMIODARONE HCL 200 MG PO TABS
200.0000 mg | ORAL_TABLET | Freq: Every day | ORAL | Status: DC
  Administered 2021-02-28 – 2021-03-24 (×19): 200 mg via ORAL
  Filled 2021-02-28 (×20): qty 1

## 2021-02-28 MED ORDER — ATORVASTATIN CALCIUM 80 MG PO TABS
80.0000 mg | ORAL_TABLET | Freq: Every day | ORAL | Status: DC
  Administered 2021-02-28 – 2021-03-23 (×20): 80 mg via ORAL
  Filled 2021-02-28: qty 2
  Filled 2021-02-28 (×21): qty 1

## 2021-02-28 MED ORDER — CLOPIDOGREL BISULFATE 75 MG PO TABS
75.0000 mg | ORAL_TABLET | Freq: Every day | ORAL | Status: DC
  Administered 2021-02-28 – 2021-03-04 (×5): 75 mg via ORAL
  Filled 2021-02-28 (×5): qty 1

## 2021-02-28 MED ORDER — DOCUSATE SODIUM 100 MG PO CAPS
100.0000 mg | ORAL_CAPSULE | Freq: Every day | ORAL | Status: DC | PRN
  Administered 2021-03-11: 100 mg via ORAL
  Filled 2021-02-28: qty 1

## 2021-02-28 MED ORDER — AMLODIPINE BESYLATE 10 MG PO TABS
10.0000 mg | ORAL_TABLET | Freq: Every day | ORAL | Status: DC
  Administered 2021-03-01 – 2021-03-24 (×18): 10 mg via ORAL
  Filled 2021-02-28 (×16): qty 1
  Filled 2021-02-28: qty 2
  Filled 2021-02-28 (×3): qty 1

## 2021-02-28 MED ORDER — LORAZEPAM 1 MG PO TABS
1.0000 mg | ORAL_TABLET | Freq: Two times a day (BID) | ORAL | Status: DC | PRN
  Administered 2021-03-01 – 2021-03-23 (×5): 1 mg via ORAL
  Filled 2021-02-28 (×5): qty 1

## 2021-02-28 MED ORDER — HEPARIN SODIUM (PORCINE) 5000 UNIT/ML IJ SOLN
5000.0000 [IU] | Freq: Two times a day (BID) | INTRAMUSCULAR | Status: DC
  Administered 2021-02-28 – 2021-03-24 (×42): 5000 [IU] via SUBCUTANEOUS
  Filled 2021-02-28 (×42): qty 1

## 2021-02-28 MED ORDER — HYDRALAZINE HCL 25 MG PO TABS
25.0000 mg | ORAL_TABLET | Freq: Four times a day (QID) | ORAL | Status: DC | PRN
  Administered 2021-03-05 – 2021-03-21 (×10): 25 mg via ORAL
  Filled 2021-02-28 (×9): qty 1

## 2021-02-28 MED ORDER — OXYCODONE-ACETAMINOPHEN 5-325 MG PO TABS
2.0000 | ORAL_TABLET | ORAL | Status: AC | PRN
  Administered 2021-02-28 – 2021-03-03 (×3): 2 via ORAL
  Filled 2021-02-28 (×3): qty 2

## 2021-02-28 MED ORDER — ALBUTEROL SULFATE (2.5 MG/3ML) 0.083% IN NEBU: 2.5000 mg | INHALATION_SOLUTION | Freq: Four times a day (QID) | RESPIRATORY_TRACT | Status: DC | PRN

## 2021-02-28 MED ORDER — LANTHANUM CARBONATE 500 MG PO CHEW
2000.0000 mg | CHEWABLE_TABLET | Freq: Three times a day (TID) | ORAL | Status: DC
  Administered 2021-02-28 – 2021-03-23 (×37): 2000 mg via ORAL
  Filled 2021-02-28 (×73): qty 4

## 2021-02-28 MED ORDER — PANTOPRAZOLE SODIUM 40 MG PO TBEC
40.0000 mg | DELAYED_RELEASE_TABLET | Freq: Every day | ORAL | Status: DC
  Administered 2021-02-28 – 2021-03-19 (×17): 40 mg via ORAL
  Filled 2021-02-28 (×19): qty 1

## 2021-02-28 MED ORDER — ACETAMINOPHEN 325 MG PO TABS
325.0000 mg | ORAL_TABLET | ORAL | Status: DC | PRN
Start: 2021-02-28 — End: 2021-03-24
  Administered 2021-03-08: 14:00:00 650 mg via ORAL
  Administered 2021-03-08: 22:00:00 325 mg via ORAL
  Administered 2021-03-10 – 2021-03-16 (×7): 650 mg via ORAL
  Filled 2021-02-28 (×8): qty 2
  Filled 2021-02-28: qty 1

## 2021-02-28 MED ORDER — ONDANSETRON HCL 4 MG PO TABS
4.0000 mg | ORAL_TABLET | Freq: Two times a day (BID) | ORAL | Status: DC | PRN
  Administered 2021-03-03 – 2021-03-16 (×3): 4 mg via ORAL
  Filled 2021-02-28 (×5): qty 1

## 2021-02-28 NOTE — ED Notes (Signed)
Pt transported to MRI 

## 2021-02-28 NOTE — Consult Note (Signed)
Chief Complaint   Chief Complaint  Patient presents with   Weakness    History of Present Illness  Jose Garrett. is a 70 y.o. male seen in consultation in the emergency department at the request of Dr. Armandina Gemma.  Patient's ex-wife is at the bedside who helps provide history.  Patient has an extensive medical history including end-stage renal disease on dialysis, coronary artery disease status post stenting with severe aortic stenosis status post valve replacement, CHF, severe peripheral vascular disease status post right BKA and amputation of to left toes, and Amyloidosis.  Although there is no particular inciting event today, both the patient and his ex-wife note several weeks of progressively worsening upper extremity and lower extremity weakness.  Patient in the past has been able to ambulate with the assistance of a rolling walker and a prosthetic for his right leg.  About 3 or 4 weeks ago it became very difficult for him to get around with a walker and he has been confined to a wheelchair.  More recently, they have even noted difficulty with transfers from bed into the wheelchair or from in and out of a car.  Especially, they have noted bilateral grip strength weakness initially started on the right and progressing to the left hand.  They have also noted essentially inability to raise his arms above about shoulder level.  Interestingly, patient does not complain of a significant amount of neck pain.  Past Medical History   Past Medical History:  Diagnosis Date   Amyloidosis ([place])    Anemia of chronic disease    Anxiety    CAD (coronary artery disease)    a. cardiac cath on 11/25/17 with occlusion of the RCA which could have been his event but the RCA could not be engaged. The RCA filled distally from left to right collaterals. There were no severe stenoses in the left coronary system. Medical therapy recommended, started on plavix.   Cancer [facility])    multiple myeloma   Chronic combined  systolic and diastolic CHF (congestive heart failure) ([place])    a. EF dropped to 35-40% with grade 2 DD by echo 11/2017.   DJD (degenerative joint disease), lumbar    Dysrhythmia    afib   ESRD (end stage renal disease) (HCC)    ESRD- HEMO MWF- [address]   GERD (gastroesophageal reflux disease)    Heart murmur    Hyperlipidemia    Hypertension    Myocardial infarction (HCC)    Neuropathy    Peripheral vascular disease (HCC)    Pneumonia    PONV (postoperative nausea and vomiting)    Restless legs    S/P TAVR (transcatheter aortic valve replacement) 10/21/2018   26 mm Edwards Sapien 3 Ultra transcatheter heart valve placed via percutaneous right transfemoral approach    Severe aortic stenosis    severe    Past Surgical History   Past Surgical History:  Procedure Laterality Date   ABDOMINAL AORTOGRAM W/LOWER EXTREMITY Bilateral 07/16/2018   Procedure: ABDOMINAL AORTOGRAM W/LOWER EXTREMITY;  Surgeon: Wellington Hampshire, MD;  Location: [place] [facility];  Service: Cardiovascular;  Laterality: Bilateral;   ABDOMINAL AORTOGRAM W/LOWER EXTREMITY N/A 04/09/2019   Procedure: ABDOMINAL AORTOGRAM W/LOWER EXTREMITY - Right Lower;  Surgeon: Waynetta Sandy, MD;  Location: [place] [facility];  Service: Cardiovascular;  Laterality: N/A;   AMPUTATION Left 08/19/2018   Procedure: AMPUTATION LEFT TOES SECOND AND THIRD;  Surgeon: Waynetta Sandy, MD;  Location: [place];  Service:  Vascular;  Laterality: Left;   AMPUTATION Right 05/07/2019   Procedure: AMPUTATION BELOW KNEE;  Surgeon: Serafina Mitchell, MD;  Location: [place];  Service: Vascular;  Laterality: Right;   AV FISTULA PLACEMENT Right 12/27/2015   Procedure: Right Arm ARTERIOVENOUS (AV) FISTULA CREATION;  Surgeon: Angelia Mould, MD;  Location: [place];  Service: Vascular;  Laterality: Right;   CARDIAC CATHETERIZATION     CARDIAC VALVE REPLACEMENT     CATARACT EXTRACTION W/ INTRAOCULAR LENS  IMPLANT, BILATERAL      COLONOSCOPY     ERCP W/ SPHICTEROTOMY     EYE SURGERY     FEMORAL-POPLITEAL BYPASS GRAFT Left 07/22/2018   Procedure: left FEMORAL TO BELOW KNEE POPLITEAL ARTERY BYPASS GREAFT using 3m removable ring propaten gore graft;  Surgeon: CWaynetta Sandy MD;  [place]  Service: Vascular;  Laterality: Left;   FEMORAL-POPLITEAL BYPASS GRAFT Right 04/10/2019   Procedure: BYPASS GRAFT RIGHT FEMORAL-POPLITEAL ARTERY;  Surgeon: ERosetta Posner MD;  [place]  Service: Vascular;  Laterality: Right;   GANGLION CYST EXCISION Left 11/02/2020   Procedure: EXCISION OF LEFT WRIST DORAL VOLAR MASSES;  Surgeon: WCharlotte Crumb MD;  [place]  Service: Orthopedics;  Laterality: Left;  CPT 25073   I & D EXTREMITY Right 05/05/2019   Procedure: Revision of  RIGHT  FOOT Amputation;  Surgeon: CWaynetta Sandy MD;  [place]  Service: Vascular;  Laterality: Right;   INTRAOPERATIVE TRANSTHORACIC ECHOCARDIOGRAM N/A 10/21/2018   Procedure: Intraoperative Transthoracic Echocardiogram;  Surgeon: CSherren Mocha MD;  [place]  Service: Open Heart Surgery;  Laterality: N/A;   IR GENERIC HISTORICAL  01/27/2016   IR FLUORO GUIDE CV LINE RIGHT 01/27/2016 DArne Cleveland MD [facility]   IR GENERIC HISTORICAL  01/27/2016   IR [place] ACCESS RIGHT 01/27/2016 DArne Cleveland MD [facility]   LAPAROSCOPIC CHOLECYSTECTOMY  03/2011   LEFT HEART CATH AND CORONARY ANGIOGRAPHY N/A 11/25/2017   Procedure: LEFT HEART CATH AND CORONARY ANGIOGRAPHY;  Surgeon: JMartinique Peter M, MD;  [place];  Service: Cardiovascular;  Laterality: N/A;   LEFT HEART CATH AND CORONARY ANGIOGRAPHY N/A 07/24/2018   Procedure: LEFT HEART CATH AND CORONARY ANGIOGRAPHY;  Surgeon: CSherren Mocha MD;  [place];  Service: Cardiovascular;  Laterality: N/A;   RENAL BIOPSY, PERCUTANEOUS Right 09/08/2014   RIGHT/LEFT HEART CATH AND CORONARY ANGIOGRAPHY N/A 03/25/2018    Procedure: RIGHT/LEFT HEART CATH AND CORONARY ANGIOGRAPHY;  Surgeon: JMartinique Peter M, MD;  [place] [facility];  Service: Cardiovascular;  Laterality: N/A;   TEE WITHOUT CARDIOVERSION N/A 05/14/2019   Procedure: TRANSESOPHAGEAL ECHOCARDIOGRAM (TEE);  Surgeon: RFay Records MD;  [facility]  Service: Cardiovascular;  Laterality: N/A;   TOE AMPUTATION Left 08/19/2018   2nd & 3rd toes   TONSILLECTOMY  ~ 1960   TRANSCATHETER AORTIC VALVE REPLACEMENT, TRANSFEMORAL N/A 10/21/2018   Procedure: TRANSCATHETER AORTIC VALVE REPLACEMENT, TRANSFEMORAL;  Surgeon: CSherren Mocha MD;  [place]  Service: Open Heart Surgery;  Laterality: N/A;   TRANSMETATARSAL AMPUTATION Right 04/29/2019   Procedure: RIGHT TRANSMETATARSAL AMPUTATION;  Surgeon: CWaynetta Sandy MD;  [place]  Service: Vascular;  Laterality: Right;   ULTRASOUND GUIDANCE FOR VASCULAR ACCESS  03/25/2018   Procedure: Ultrasound Guidance For Vascular Access;  Surgeon: JMartinique Peter M, MD;  [place] [facility];  Service: Cardiovascular;;    Social History   Social History   Tobacco Use  Smoking status: Former    Packs/day: 2.00    Years: 40.00    Pack years: 80.00    Types: Cigarettes    Quit date: 03/19/2011    Years since quitting: 9.9   Smokeless tobacco: Never  Vaping Use   Vaping Use: Never used  Substance Use Topics   Alcohol use: No    Alcohol/week: 0.0 standard drinks   Drug use: No    Comment:  "I have used marijuana till the 1990's". Notes that he quit 15 yrs ago    Medications   Prior to Admission medications   Medication Sig Start Date End Date Taking? Authorizing Provider  albuterol (VENTOLIN HFA) 108 (90 Base) MCG/ACT inhaler Inhale 1 puff into the lungs every 6 hours as needed Patient taking differently: Inhale 1-2 puffs into the lungs every 6 (six) hours as needed for wheezing or shortness of breath. 01/24/21  Yes   amiodarone (PACERONE) 200 MG tablet TAKE  1 TABLET (200 MG TOTAL) BY MOUTH DAILY. Patient taking differently: Take 200 mg by mouth daily. 09/07/20 09/07/21 Yes Burnell Blanks, MD  amLODipine (NORVASC) 10 MG tablet Take 1 tablet (10 mg total) by mouth at bedtime. 10/18/20  Yes Oswald Hillock, MD  atorvastatin (LIPITOR) 80 MG tablet TAKE 1 TABLET BY MOUTH DAILY WITH SUPPER Patient taking differently: Take 80 mg by mouth daily with supper. 04/28/20 04/28/21 Yes Burnell Blanks, MD  B Complex-C-Folic Acid (RENA-VITE RX) 1 MG TABS Take 1 tablet by mouth once a day 10/19/20  Yes   clopidogrel (PLAVIX) 75 MG tablet TAKE 1 TABLET BY MOUTH DAILY WITH SUPPER Patient taking differently: Take 75 mg by mouth daily with supper. 04/28/20 04/28/21 Yes Burnell Blanks, MD  Darbepoetin Alfa (ARANESP) 100 MCG/0.5ML SOSY injection Inject 0.5 mLs (100 mcg total) into the vein every Friday with hemodialysis. 05/29/19  Yes Angiulli, Lavon Paganini, PA-C  docusate sodium (COLACE) 100 MG capsule Take 1 capsule (100 mg total) by mouth 2 (two) times daily. Patient taking differently: Take 100 mg by mouth daily as needed for moderate constipation. 05/27/19  Yes Angiulli, Lavon Paganini, PA-C  ethyl chloride spray Apply 1 spray to dialysis access prior to dialysis as directed. 01/18/21  Yes   lanthanum (FOSRENOL) 1000 MG chewable tablet Chew 2,000 mg by mouth 3 (three) times daily. 07/15/20  Yes [provider]  LORazepam (ATIVAN) 1 MG tablet Take 1 tablet (1 mg total) by mouth 2 (two) times daily as needed for anxiety. 11/10/20  Yes Pahwani, Einar Grad, MD  multivitamin (RENA-VIT) TABS tablet Take 1 tablet by mouth at bedtime. 08/12/18  Yes Angiulli, Lavon Paganini, PA-C  ondansetron (ZOFRAN) 4 MG tablet TAKE 1 TABLET BY MOUTH TWICE A DAY AS NEEDED Patient taking differently: Take 4 mg by mouth 2 (two) times daily as needed for nausea or vomiting. 06/28/20 06/28/21 Yes Wellington Hampshire, MD  oxyCODONE-acetaminophen (PERCOCET) 5-325 MG tablet Take 1 tablet by mouth every 4 (four)  hours as needed for severe pain. 11/10/20 11/10/21 Yes Pahwani, Einar Grad, MD  pantoprazole (PROTONIX) 40 MG tablet Take 1 tablet (40 mg total) by mouth daily. 05/28/19  Yes Angiulli, Lavon Paganini, PA-C  vitamin B-12 (CYANOCOBALAMIN) 1000 MCG tablet Take 1 tablet (1,000 mcg total) by mouth every evening. 05/27/19  Yes Angiulli, Lavon Paganini, PA-C  acetaminophen (TYLENOL) 325 MG tablet Take 1-2 tablets (325-650 mg total) by mouth every 4 (four) hours as needed for mild pain (or temp >/= 101 F). 05/27/19   Angiulli, Quillian Quince  J, PA-C    Allergies   Allergies  Allergen Reactions   Levonorgestrel-Ethinyl Estrad Swelling    Review of Systems  ROS  Neurologic Exam  Awake, alert, oriented Memory and concentration grossly intact Speech fluent, appropriate CN grossly intact Motor exam: Upper Extremities Deltoid Bicep Tricep Grip  Right 4-/5 3/5 4-/5 4/5  Left 4-/5 4-/5 4-/5 4/5   Lower Extremities IP Quad PF DF EHL  Right 4+/5 4+/5 BKA    Left 4/5 4/5 5/5 5/5 5/5   Sensation grossly intact to LT  Imaging  MRI of the cervical spine was personally reviewed and demonstrates straightening of the normal cervical lordosis.  Primary finding is at C3-4 C4-5 and C5-6 where at all 3 levels there is large broad-based disc osteophyte complex with resultant severe central stenosis and deformation of the cord/spinal cord compression.  There is suggestion of signal change within the cord at the C3-4 level suggesting myelomalacia.  Impression  - 70 y.o. male with multiple severe medical comorbidities and several weeks of progressively worsening weakness.  While I suspect this is likely multifactorial, he certainly does have severe cervical stenosis C3-C6 as a major contributing factor.  Unfortunately, given his extensive medical history, any consideration of operative intervention carries a significant risk of serious perioperative morbidity or even mortality.  Plan  - I will review the situation with the primary service,  however I suspect that he will not be a candidate for surgical decompression  - Admit to medical service - PT/OT evaluations for possible long-term placement - Would not recommend steroids at this point.  I have reviewed the situation with the patient and his ex-wife. I told them that the only treatment for his cervical stenosis is surgery but that he is likely not a candidate given his medical history. All their questions were answered. I will follow-up with them again tomorrow.  Consuella Lose, MD [facility]

## 2021-02-28 NOTE — ED Provider Notes (Addendum)
[facility] EMERGENCY DEPARTMENT Provider Note   CSN: [id number] Arrival date & time: 02/27/21  1720     History Chief Complaint  Patient presents with   Weakness    Jose Garrett. is a 70 y.o. male.   Weakness  70 year old male with a history of amyloidosis, CAD, DJD of Jose lumbar spine, ESRD on HD MWF, GERD, HLD, HTN, PVD Status post right BKA, severe aortic stenosis status post TAVR who presents to [place] emergency department with back pain, inability to perform ADLs, new weakness.  This was brought by Jose patient and Jose Garrett.  Jose patient lives at home with his daughter.  He has been having worsening bilateral upper extremity weakness with worsening neck pain and pain in his lower back.  He has been unable to perform his ADLs at home over Jose past few days and has been languishing.  He also has had a few episodes of fecal incontinence on himself that is new and concerning to him.  He has had 2 recent falls.  He has chronic low back pain.  He feels that he is losing grip strength in his hands bilaterally and has worsening weakness in his upper extremities bilaterally.  Family worse have been unable to help him transition to a wheelchair at home and are concerned he may need placement in a SNF.  Past Medical History:  Diagnosis Date   Amyloidosis ([place])    Anemia of chronic disease    Anxiety    CAD (coronary artery disease)    a. cardiac cath on 11/25/17 with occlusion of Jose RCA which could have been his event but Jose RCA could not be engaged. Jose RCA filled distally from left to right collaterals. There were no severe stenoses in Jose left coronary system. Medical therapy recommended, started on plavix.   Cancer [facility])    multiple myeloma   Chronic combined systolic and diastolic CHF (congestive heart failure) ([place])    a. EF dropped to 35-40% with grade 2 DD by echo 11/2017.   DJD (degenerative joint disease), lumbar    Dysrhythmia    afib   ESRD  (end stage renal disease) ([place])    ESRD- HEMO MWF- [address]   GERD (gastroesophageal reflux disease)    Heart murmur    Hyperlipidemia    Hypertension    Myocardial infarction (HCC)    Neuropathy    Peripheral vascular disease (HCC)    Pneumonia    PONV (postoperative nausea and vomiting)    Restless legs    S/P TAVR (transcatheter aortic valve replacement) 10/21/2018   26 mm Edwards Sapien 3 Ultra transcatheter heart valve placed via percutaneous right transfemoral approach    Severe aortic stenosis    severe    Patient Active Problem List   Diagnosis Date Noted   Spinal stenosis 02/28/2021   Spinal cord compression ([place]) 02/28/2021   Altered mental status 78/29/5621   Acute metabolic encephalopathy 30/86/5784   Mixed hyperlipidemia 11/04/2020   Volume overload 10/16/2020   Acute hypoxemic respiratory failure ([place]) 10/16/2020   Hypertensive urgency 10/16/2020   Pain due to onychomycosis of toenail of left foot 08/30/2020   Lumbar spondylosis 01/23/2020   Neuropathy 10/06/2019   Gait abnormality 08/06/2019   Back muscle spasm 07/09/2019   Sleep disturbance    Nausea without vomiting    Orthostatic hypotension    Acute on chronic anemia    Chronic combined systolic and diastolic CHF (congestive heart failure) ([place])  ESRD on dialysis [facility])    Right below-knee amputee ([place]) 05/18/2019   Pressure injury of skin 05/14/2019   Enterococcal bacteremia 05/13/2019   Headache, unspecified 01/12/2019   Allergy, unspecified, initial encounter 12/29/2018   Polymorphic ventricular tachycardia 10/24/2018   S/P TAVR (transcatheter aortic valve replacement) 10/21/2018   Anxiety    GERD without esophagitis    Coronary artery disease involving native coronary artery of native heart without angina pectoris    Thrombocytopenia (HCC)    PAD (peripheral artery disease) ([place]) 07/22/2018   Acute on chronic combined systolic and diastolic CHF (congestive heart failure) ([place]) 11/30/2017    Hypercalcemia 06/24/2017   Encounter for removal of sutures 08/15/2016   Other mechanical complication of surgically created arteriovenous fistula, subsequent encounter 06/22/2016   Unspecified protein-calorie malnutrition ([place]) 02/06/2016   Secondary hyperparathyroidism of renal origin ([place]) 02/06/2016   Pruritus, unspecified 02/06/2016   Other specified coagulation defects ([place]) 02/06/2016   Hypokalemia 02/06/2016   Diarrhea, unspecified 76/28/3151   Complication of vascular dialysis catheter 02/06/2016   Aftercare including intermittent dialysis ([place]) 02/06/2016   ESRD (end stage renal disease) on dialysis ([place])    Iron deficiency anemia 11/22/2015   AL amyloidosis ([place]) 09/25/2014   Anemia of chronic disease 09/25/2014   Essential hypertension, benign 11/06/2013    Past Surgical History:  Procedure Laterality Date   ABDOMINAL AORTOGRAM W/LOWER EXTREMITY Bilateral 07/16/2018   Procedure: ABDOMINAL AORTOGRAM W/LOWER EXTREMITY;  Surgeon: Wellington Hampshire, MD;  Location: [place] CV [facility];  Service: Cardiovascular;  Laterality: Bilateral;   ABDOMINAL AORTOGRAM W/LOWER EXTREMITY N/A 04/09/2019   Procedure: ABDOMINAL AORTOGRAM W/LOWER EXTREMITY - Right Lower;  Surgeon: Waynetta Sandy, MD;  Location: [place] [facility];  Service: Cardiovascular;  Laterality: N/A;   AMPUTATION Left 08/19/2018   Procedure: AMPUTATION LEFT TOES SECOND AND THIRD;  Surgeon: Waynetta Sandy, MD;  Location: [place];  Service: Vascular;  Laterality: Left;   AMPUTATION Right 05/07/2019   Procedure: AMPUTATION BELOW KNEE;  Surgeon: Serafina Mitchell, MD;  Location: [place];  Service: Vascular;  Laterality: Right;   AV FISTULA PLACEMENT Right 12/27/2015   Procedure: Right Arm ARTERIOVENOUS (AV) FISTULA CREATION;  Surgeon: Angelia Mould, MD;  Location: [place];  Service: Vascular;  Laterality: Right;   CARDIAC CATHETERIZATION     CARDIAC VALVE REPLACEMENT     CATARACT EXTRACTION W/  INTRAOCULAR LENS  IMPLANT, BILATERAL     COLONOSCOPY     ERCP W/ SPHICTEROTOMY     EYE SURGERY     FEMORAL-POPLITEAL BYPASS GRAFT Left 07/22/2018   Procedure: left FEMORAL TO BELOW KNEE POPLITEAL ARTERY BYPASS GREAFT using 2m removable ring propaten gore graft;  Surgeon: CWaynetta Sandy MD;  [facility]  Service: Vascular;  Laterality: Left;   FEMORAL-POPLITEAL BYPASS GRAFT Right 04/10/2019   Procedure: BYPASS GRAFT RIGHT FEMORAL-POPLITEAL ARTERY;  Surgeon: ERosetta Posner MD;  [facility]: [facility]  Service: Vascular;  Laterality: Right;   GANGLION CYST EXCISION Left 11/02/2020   Procedure: EXCISION OF LEFT WRIST DORAL VOLAR MASSES;  Surgeon: WCharlotte Crumb MD;  [place]  Service: Orthopedics;  Laterality: Left;  CPT 25073   I & D EXTREMITY Right 05/05/2019   Procedure: Revision of  RIGHT  FOOT Amputation;  Surgeon: CWaynetta Sandy MD;  [place]  Service: Vascular;  Laterality: Right;   INTRAOPERATIVE TRANSTHORACIC ECHOCARDIOGRAM N/A 10/21/2018   Procedure: Intraoperative Transthoracic Echocardiogram;  Surgeon: CSherren Mocha MD;  [place] [facility]  Service: Open  Heart Surgery;  Laterality: N/A;   IR GENERIC HISTORICAL  01/27/2016   IR FLUORO GUIDE CV LINE RIGHT 01/27/2016 Arne Cleveland, MD [facility]   IR GENERIC HISTORICAL  01/27/2016   IR US GUIDE VASC ACCESS RIGHT 01/27/2016 Arne Cleveland, MD [facility]   LAPAROSCOPIC CHOLECYSTECTOMY  03/2011   LEFT HEART CATH AND CORONARY ANGIOGRAPHY N/A 11/25/2017   Procedure: LEFT HEART CATH AND CORONARY ANGIOGRAPHY;  Surgeon: Martinique, Peter M, MD;  Location: [place] [facility];  Service: Cardiovascular;  Laterality: N/A;   LEFT HEART CATH AND CORONARY ANGIOGRAPHY N/A 07/24/2018   Procedure: LEFT HEART CATH AND CORONARY ANGIOGRAPHY;  Surgeon: Sherren Mocha, MD;  Location: [place] [facility];  Service: Cardiovascular;  Laterality: N/A;   RENAL BIOPSY, PERCUTANEOUS Right 09/08/2014   RIGHT/LEFT HEART  CATH AND CORONARY ANGIOGRAPHY N/A 03/25/2018   Procedure: RIGHT/LEFT HEART CATH AND CORONARY ANGIOGRAPHY;  Surgeon: Martinique, Peter M, MD;  Location: [place] [facility];  Service: Cardiovascular;  Laterality: N/A;   TEE WITHOUT CARDIOVERSION N/A 05/14/2019   Procedure: TRANSESOPHAGEAL ECHOCARDIOGRAM (TEE);  Surgeon: Fay Records, MD;  [facility];  Service: Cardiovascular;  Laterality: N/A;   TOE AMPUTATION Left 08/19/2018   2nd & 3rd toes   TONSILLECTOMY  ~ 1960   TRANSCATHETER AORTIC VALVE REPLACEMENT, TRANSFEMORAL N/A 10/21/2018   Procedure: TRANSCATHETER AORTIC VALVE REPLACEMENT, TRANSFEMORAL;  Surgeon: Sherren Mocha, MD;  Location: [facility];  Service: Open Heart Surgery;  Laterality: N/A;   TRANSMETATARSAL AMPUTATION Right 04/29/2019   Procedure: RIGHT TRANSMETATARSAL AMPUTATION;  Surgeon: Waynetta Sandy, MD;  Location: [place];  Service: Vascular;  Laterality: Right;   ULTRASOUND GUIDANCE FOR VASCULAR ACCESS  03/25/2018   Procedure: Ultrasound Guidance For Vascular Access;  Surgeon: Martinique, Peter M, MD;  Location: [place] CV [facility];  Service: Cardiovascular;;       Family History  Problem Relation Age of Onset   Hypertension Mother    Diabetes Mother    Heart disease Mother    Skin cancer Mother    Hypertension Father    Diabetes Father    Diabetes Sister     Social History   Tobacco Use   Smoking status: Former    Packs/day: 2.00    Years: 40.00    Pack years: 80.00    Types: Cigarettes    Quit date: 03/19/2011    Years since quitting: 9.9   Smokeless tobacco: Never  Vaping Use   Vaping Use: Never used  Substance Use Topics   Alcohol use: No    Alcohol/week: 0.0 standard drinks   Drug use: No    Comment:  "I have used marijuana till Jose 1990's". Notes that he quit 15 yrs ago    Home Medications Prior to Admission medications   Medication Sig Start Date End Date Taking? Authorizing Provider  albuterol (VENTOLIN HFA) 108 (90 Base) MCG/ACT  inhaler Inhale 1 puff into Jose lungs every 6 hours as needed Patient taking differently: Inhale 1-2 puffs into Jose lungs every 6 (six) hours as needed for wheezing or shortness of breath. 01/24/21  Yes   amiodarone (PACERONE) 200 MG tablet TAKE 1 TABLET (200 MG TOTAL) BY MOUTH DAILY. Patient taking differently: Take 200 mg by mouth daily. 09/07/20 09/07/21 Yes Burnell Blanks, MD  amLODipine (NORVASC) 10 MG tablet Take 1 tablet (10 mg total) by mouth at bedtime. 10/18/20  Yes Oswald Hillock, MD  atorvastatin (LIPITOR) 80 MG tablet TAKE 1 TABLET BY MOUTH DAILY WITH SUPPER Patient  taking differently: Take 80 mg by mouth daily with supper. 04/28/20 04/28/21 Yes Burnell Blanks, MD  B Complex-C-Folic Acid (RENA-VITE RX) 1 MG TABS Take 1 tablet by mouth once a day 10/19/20  Yes   clopidogrel (PLAVIX) 75 MG tablet TAKE 1 TABLET BY MOUTH DAILY WITH SUPPER Patient taking differently: Take 75 mg by mouth daily with supper. 04/28/20 04/28/21 Yes Burnell Blanks, MD  Darbepoetin Alfa (ARANESP) 100 MCG/0.5ML SOSY injection Inject 0.5 mLs (100 mcg total) into Jose vein every Friday with hemodialysis. 05/29/19  Yes Angiulli, Lavon Paganini, PA-C  docusate sodium (COLACE) 100 MG capsule Take 1 capsule (100 mg total) by mouth 2 (two) times daily. Patient taking differently: Take 100 mg by mouth daily as needed for moderate constipation. 05/27/19  Yes Angiulli, Lavon Paganini, PA-C  ethyl chloride spray Apply 1 spray to dialysis access prior to dialysis as directed. 01/18/21  Yes   lanthanum (FOSRENOL) 1000 MG chewable tablet Chew 2,000 mg by mouth 3 (three) times daily. 07/15/20  Yes [provider]  LORazepam (ATIVAN) 1 MG tablet Take 1 tablet (1 mg total) by mouth 2 (two) times daily as needed for anxiety. 11/10/20  Yes Pahwani, Einar Grad, MD  multivitamin (RENA-VIT) TABS tablet Take 1 tablet by mouth at bedtime. 08/12/18  Yes Angiulli, Lavon Paganini, PA-C  ondansetron (ZOFRAN) 4 MG tablet TAKE 1 TABLET BY MOUTH TWICE A  DAY AS NEEDED Patient taking differently: Take 4 mg by mouth 2 (two) times daily as needed for nausea or vomiting. 06/28/20 06/28/21 Yes [place] Hampshire, MD  oxyCODONE-acetaminophen (PERCOCET) 5-325 MG tablet Take 1 tablet by mouth every 4 (four) hours as needed for severe pain. 11/10/20 11/10/21 Yes Pahwani, Einar Grad, MD  pantoprazole (PROTONIX) 40 MG tablet Take 1 tablet (40 mg total) by mouth daily. 05/28/19  Yes Angiulli, Lavon Paganini, PA-C  vitamin B-12 (CYANOCOBALAMIN) 1000 MCG tablet Take 1 tablet (1,000 mcg total) by mouth every evening. 05/27/19  Yes Angiulli, Lavon Paganini, PA-C  acetaminophen (TYLENOL) 325 MG tablet Take 1-2 tablets (325-650 mg total) by mouth every 4 (four) hours as needed for mild pain (or temp >/= 101 F). 05/27/19   Angiulli, Lavon Paganini, PA-C    Allergies    Levonorgestrel-ethinyl estrad  Review of Systems   Review of Systems  Genitourinary:  Positive for difficulty urinating.  Musculoskeletal:  Positive for back pain and neck pain.  Neurological:  Positive for weakness and numbness.  All other systems reviewed and are negative.  Physical Exam Updated Vital Signs BP (!) 196/170    Pulse 84    Temp 98.2 F (36.8 C) (Oral)    Resp 16    Ht _0  (1.778 m)    Wt 73 kg    SpO2 (!) 88%    BMI 23.09 kg/m   Physical Exam Vitals and nursing note reviewed.  Constitutional:      General: He is not in acute distress.    Appearance: He is well-developed.  HENT:     Head: Normocephalic and atraumatic.  Eyes:     Conjunctiva/sclera: Conjunctivae normal.  Cardiovascular:     Rate and Rhythm: Normal rate and regular rhythm.     Heart sounds: No murmur heard. Pulmonary:     Effort: Pulmonary effort is normal. No respiratory distress.     Breath sounds: Normal breath sounds.  Abdominal:     Palpations: Abdomen is soft.     Tenderness: There is no abdominal tenderness.  Musculoskeletal:  General: No swelling.     Cervical back: Neck supple.     Comments: Status post right  BKA.   Skin:    General: Skin is warm and dry.     Capillary Refill: Capillary refill takes less than 2 seconds.  Neurological:     Mental Status: He is alert and oriented to person, place, and time.     Comments: No cranial nerve deficit.  4-5 strength bilaterally in Jose upper extremities with 4-5 grip strength also noted.  Intact sensation bilaterally.  5 out of 5 strength in Jose lower extremities bilaterally.  Psychiatric:        Mood and Affect: Mood normal.    ED Results / Procedures / Treatments   Labs (all labs ordered are listed, but only abnormal results are displayed) Labs Reviewed  COMPREHENSIVE METABOLIC PANEL - Abnormal; Notable for Jose following components:      Result Value   Chloride 94 (*)    Creatinine, Ser 5.12 (*)    Total Bilirubin 1.3 (*)    GFR, Estimated 12 (*)    All other components within normal limits  CBC - Abnormal; Notable for Jose following components:   RBC 2.90 (*)    Hemoglobin 9.2 (*)    HCT 28.2 (*)    RDW 15.7 (*)    Platelets 128 (*)    All other components within normal limits  CBG MONITORING, ED - Abnormal; Notable for Jose following components:   Glucose-Capillary 112 (*)    All other components within normal limits  MAGNESIUM    EKG EKG Interpretation  Date/Time:  Monday February 27 2021 18:24:12 EST Ventricular Rate:  83 PR Interval:  170 QRS Duration: 162 QT Interval:  486 QTC Calculation: 571 R Axis:   106 Text Interpretation: Normal sinus rhythm Rightward axis Non-specific intra-ventricular conduction block Abnormal ECG Artifact Confirmed by Elnora Morrison [phone]) on 02/28/2021 7:21:44 AM  Radiology CT HEAD WO CONTRAST (5MM)  Result Date: 02/27/2021 CLINICAL DATA:  Mental status change, unknown cause. EXAM: CT HEAD WITHOUT CONTRAST CT CERVICAL SPINE WITHOUT CONTRAST TECHNIQUE: Multidetector CT imaging of Jose head and cervical spine was performed following Jose standard protocol without intravenous contrast. Multiplanar CT  image reconstructions of Jose cervical spine were also generated. COMPARISON:  11/03/2020. FINDINGS: CT HEAD FINDINGS Brain: No acute intracranial hemorrhage, midline shift or mass effect. Diffuse atrophy is noted. Mild periventricular white matter hypodensities are seen bilaterally. There is no hydrocephalus. Vascular: Atherosclerotic calcification of Jose vertebral arteries and carotid siphons. No hyperdense vessel. Skull: Normal. Negative for fracture or focal lesion. Sinuses/Orbits: No acute finding. Other: None. CT CERVICAL SPINE FINDINGS Alignment: There is mild retrolisthesis at C5-C6 and mild kyphosis of Jose mid cervical spine. Skull base and vertebrae: No acute fracture. No primary bone lesion or focal pathologic process. Soft tissues and spinal canal: No prevertebral fluid or swelling. No visible canal hematoma. Disc levels: Multilevel intervertebral disc space narrowing, osteophyte formation, and facet arthropathy is noted at multiple levels. There are disc osteophyte complexes at C3-C4, C4-C5, and C5-C6 resulting in moderate to severe spinal canal and neural foraminal stenosis. Upper chest: There are moderate layering bilateral pleural effusions with hazy airspace opacities in Jose lungs. Other: Atherosclerotic calcification of Jose carotid arteries. IMPRESSION: 1. No acute intracranial process. 2. Atrophy with chronic microvascular ischemic changes. 3. No acute fracture or subluxation in Jose cervical spine. 4. Multilevel disc herniations, disc osteophyte complexes and facet arthropathy resulting in moderate to severe spinal canal and neural  foraminal stenosis from C3-C6. MRI and/or surgical consultation is suggested for follow-up. Electronically Signed   By: Brett Fairy M.D.   On: 02/27/2021 21:18   CT Cervical Spine Wo Contrast  Result Date: 02/27/2021 CLINICAL DATA:  Mental status change, unknown cause. EXAM: CT HEAD WITHOUT CONTRAST CT CERVICAL SPINE WITHOUT CONTRAST TECHNIQUE: Multidetector CT  imaging of Jose head and cervical spine was performed following Jose standard protocol without intravenous contrast. Multiplanar CT image reconstructions of Jose cervical spine were also generated. COMPARISON:  11/03/2020. FINDINGS: CT HEAD FINDINGS Brain: No acute intracranial hemorrhage, midline shift or mass effect. Diffuse atrophy is noted. Mild periventricular white matter hypodensities are seen bilaterally. There is no hydrocephalus. Vascular: Atherosclerotic calcification of Jose vertebral arteries and carotid siphons. No hyperdense vessel. Skull: Normal. Negative for fracture or focal lesion. Sinuses/Orbits: No acute finding. Other: None. CT CERVICAL SPINE FINDINGS Alignment: There is mild retrolisthesis at C5-C6 and mild kyphosis of Jose mid cervical spine. Skull base and vertebrae: No acute fracture. No primary bone lesion or focal pathologic process. Soft tissues and spinal canal: No prevertebral fluid or swelling. No visible canal hematoma. Disc levels: Multilevel intervertebral disc space narrowing, osteophyte formation, and facet arthropathy is noted at multiple levels. There are disc osteophyte complexes at C3-C4, C4-C5, and C5-C6 resulting in moderate to severe spinal canal and neural foraminal stenosis. Upper chest: There are moderate layering bilateral pleural effusions with hazy airspace opacities in Jose lungs. Other: Atherosclerotic calcification of Jose carotid arteries. IMPRESSION: 1. No acute intracranial process. 2. Atrophy with chronic microvascular ischemic changes. 3. No acute fracture or subluxation in Jose cervical spine. 4. Multilevel disc herniations, disc osteophyte complexes and facet arthropathy resulting in moderate to severe spinal canal and neural foraminal stenosis from C3-C6. MRI and/or surgical consultation is suggested for follow-up. Electronically Signed   By: Brett Fairy M.D.   On: 02/27/2021 21:18   CT Lumbar Spine Wo Contrast  Result Date: 02/27/2021 CLINICAL DATA:   Initial evaluation for low back pain, weakness. EXAM: CT LUMBAR SPINE WITHOUT CONTRAST TECHNIQUE: Multidetector CT imaging of Jose lumbar spine was performed without intravenous contrast administration. Multiplanar CT image reconstructions were also generated. COMPARISON:  Prior MRI from 02/25/2015. FINDINGS: Segmentation: Standard. Lowest well-formed disc space labeled Jose L5-S1 level. Alignment: Physiologic with preservation of Jose normal lumbar lordosis. No listhesis. Vertebrae: Vertebral body height maintained without acute or chronic fracture. Visualized sacrum and pelvis intact. SI joints symmetric and normal. No discrete or worrisome osseous lesions. Paraspinal and other soft tissues: Paraspinous soft tissues demonstrate no acute finding. Extensive arteriosclerosis seen throughout Jose visualized abdomen and pelvis. Visualized native kidneys are atrophic, compatible with history of end-stage renal disease. Approximate 1.5 cm benign appearing cyst noted at Jose left kidney. Bilateral pleural effusions partially visualized. Disc levels: L1-2: Minimal annular disc bulge with mild facet hypertrophy. No spinal stenosis. Foramina remain patent. L2-3: Mild annular disc bulge with bilateral facet hypertrophy. Resultant mild narrowing of Jose lateral recesses bilaterally. Central canal remains patent. Foramina remain patent. L3-4: Mild diffuse disc bulge with associated biforaminal disc protrusions. Mild to moderate right greater than left facet hypertrophy. Resultant mild canal with moderate bilateral lateral recess stenosis. Moderate bilateral L3 foraminal stenosis. L4-5: Diffuse disc bulge, slightly asymmetric to Jose left. Mild to moderate bilateral facet hypertrophy. Resultant moderate narrowing of Jose lateral recesses bilaterally. Central canal remains patent. Moderate left with mild to moderate right L4 foraminal stenosis. L5-S1: Advanced degenerative intervertebral disc space narrowing with disc desiccation.  Associated reactive endplate  spurring. Broad-based posterior disc bulge contacts both of Jose descending S1 nerve roots as they course through Jose lateral recesses. No frank neural impingement or displacement. Moderate facet hypertrophy. No significant spinal stenosis. Mild to moderate right worse than left L5 foraminal narrowing. IMPRESSION: 1. No acute abnormality within Jose lumbar spine. 2. Multifactorial degenerative changes at L3-4 and L4-5 with resultant moderate bilateral lateral recess stenosis. 3. Moderate bilateral L3, L4, and L5 foraminal narrowing related to disc bulge, reactive endplate spurring, and facet hypertrophy. 4. Bilateral pleural effusions, partially visualized. 5.  Aortic Atherosclerosis (ICD10-I70.0). Electronically Signed   By: Jeannine Boga M.D.   On: 02/27/2021 21:35   MR Cervical Spine Wo Contrast  Result Date: 02/28/2021 CLINICAL DATA:  Weakness EXAM: MRI CERVICAL, THORACIC AND LUMBAR SPINE WITHOUT CONTRAST TECHNIQUE: Multiplanar and multiecho pulse sequences of Jose cervical spine, to include Jose craniocervical junction and cervicothoracic junction, and thoracic and lumbar spine, were obtained without intravenous contrast. COMPARISON:  Cervical and lumbar spine CT 02/27/2021, CTA chest 09/30/2018, lumbar spine MRI 02/25/2015 FINDINGS: MRI CERVICAL SPINE FINDINGS Alignment: There is slight reversal of Jose normal cervical spine lordosis centered at C4. There is trace retrolisthesis of C5 on C6. Alignment is otherwise normal. Vertebrae: Vertebral body heights are preserved. Heterogeneous marrow signal at C3 through C7 is likely degenerative in nature. There is no suspicious marrow signal abnormality. Cord: Jose cord is compressed from C3-C4 through C5-C6. There is suspected signal abnormality in Jose left aspect of Jose cord at C3-C4 (4-14). No other definite cord signal abnormality is seen. Posterior Fossa, vertebral arteries, paraspinal tissues: Jose imaged posterior fossa is  unremarkable. Jose paraspinal soft tissues are unremarkable. Jose left vertebral artery flow void is present. Jose right vertebral artery flow void is not definitely seen. Disc levels: There is marked disc space narrowing at C3-C4 through C6-C7. C2-C3: There is mild uncovertebral and bilateral facet arthropathy resulting in mild spinal canal stenosis without significant neural foraminal stenosis C3-C4: There is a posterior disc osteophyte complex with a prominent broad-based protrusion, and uncovertebral and bilateral facet arthropathy resulting in severe spinal canal stenosis with cord compression and possible cord signal abnormality as above, and severe bilateral neural foraminal stenosis. C4-C5: There is a posterior disc osteophyte complex with a broad-based protrusion and uncovertebral and bilateral facet arthropathy resulting in severe spinal canal stenosis with cord compression and severe bilateral neural foraminal stenosis. C5-C6: There is a posterior disc osteophyte complex with a left paracentral protrusion and uncovertebral and facet arthropathy resulting in severe spinal canal stenosis with cord compression and severe bilateral neural foraminal stenosis. C6-C7: There is a mild posterior disc osteophyte complex and uncovertebral and bilateral facet arthropathy resulting in mild spinal canal stenosis and moderate to severe left and mild right neural foraminal stenosis C7-T1: No significant spinal canal or neural foraminal stenosis. MRI THORACIC SPINE FINDINGS Alignment:  Normal. Vertebrae: Vertebral body heights are preserved. There is mild degenerative endplate marrow signal abnormality at T10-T11 with associated prominent Schmorl's nodes indenting Jose adjacent endplates. A few smaller Schmorl's nodes are also noted in Jose upper thoracic spine. There is no suspicious marrow signal abnormality. Cord:  Normal in signal and morphology. Paraspinal and other soft tissues: There are moderate size bilateral pleural  effusions. Paraspinal soft tissues are unremarkable. Disc levels: There is multilevel disc space narrowing and mild degenerative endplate change. There is no significant spinal canal or neural foraminal stenosis. MRI LUMBAR SPINE FINDINGS Segmentation: Standard; Jose lowest formed disc space is designated L5-S1. Alignment:  Normal.  Vertebrae: Vertebral body heights are preserved. There is degenerative endplate marrow signal abnormality at L5-S1. There is no suspicious marrow signal abnormality. Conus medullaris and cauda equina: Conus extends to Jose mid L1 level. Conus and cauda equina appear normal. Paraspinal and other soft tissues: There are innumerable cysts in Jose bilateral atrophic kidneys. A T2 hyperintense lesion in Jose right hepatic lobe likely reflects a cyst. Jose paraspinal soft tissues are unremarkable. Disc levels: There is disc desiccation and narrowing at L5-S1. Jose disc spaces are otherwise overall preserved. There is relatively mild multilevel facet arthropathy throughout Jose lumbar spine. T12-L1: There is a minimal disc bulge without significant spinal canal or neural foraminal stenosis. L1-L2: There is a minimal disc bulge without significant spinal canal or neural foraminal stenosis. L2-L3: There is a mild disc bulge with bilateral foraminal components and mild degenerative endplate change and bilateral facet arthropathy resulting in mild left worse than right neural foraminal stenosis and mild subarticular zone narrowing without significant spinal canal stenosis. L3-L4: There is a diffuse disc bulge with bilateral foraminal components and degenerative endplate change and bilateral facet arthropathy resulting in mild-to-moderate spinal canal stenosis with crowding of Jose subarticular zones and moderate bilateral neural foraminal stenosis. L4-L5: There is a diffuse disc bulge with a left foraminal component, degenerative endplate change, and mild bilateral facet arthropathy resulting in mild  crowding of Jose subarticular zones without significant spinal canal stenosis and moderate left and mild right neural foraminal stenosis. L5-S1: There is a disc bulge eccentric to Jose right with a right foraminal component, degenerative endplate change, and bilateral facet arthropathy resulting in mild crowding of Jose subarticular zones, right worse than left, without evidence of nerve root impingement, and moderate to severe right and mild left neural foraminal stenosis. IMPRESSION: CERVICAL SPINE MRI: 1. Advanced degenerative change in Jose cervical spine as detailed above resulting in severe spinal canal stenosis with cord compression from C3-C4 through C5-C6. There is possible cord signal abnormality in Jose left aspect of Jose cord at C3-C4 which may reflect compressive myelopathy or myelomalacia. 2. Severe bilateral neural foraminal stenosis at C3-C4 through C5-C6 and moderate to severe left neural foraminal stenosis at C6-C7. 3. Jose right vertebral artery flow void is not definitely seen which could reflect chronic occlusion. This could be further evaluated with CTA or MRA of Jose neck as indicated. THORACIC SPINE MRI: 1. Mild multilevel degenerative changes. No high-grade spinal canal or neural foraminal stenosis. 2. Moderate bilateral pleural effusions. LUMBAR SPINE MRI: 1. Mild-to-moderate spinal canal stenosis at L3-L4 with crowding of Jose subarticular zones but no definite evidence of nerve root impingement. There is more mild subarticular zone narrowing at L2-L3, L4-L5, and L5-S1 also without evidence of nerve root impingement. 2. Moderate bilateral neural foraminal stenosis at L3-L4, moderate left neural foraminal stenosis at L4-L5, and moderate to severe right neural foraminal stenosis at L5-S1. 3. Relatively mild facet arthropathy throughout Jose lumbar spine. Electronically Signed   By: Valetta Mole M.D.   On: 02/28/2021 11:43   MR THORACIC SPINE WO CONTRAST  Result Date: 02/28/2021 CLINICAL DATA:   Weakness EXAM: MRI CERVICAL, THORACIC AND LUMBAR SPINE WITHOUT CONTRAST TECHNIQUE: Multiplanar and multiecho pulse sequences of Jose cervical spine, to include Jose craniocervical junction and cervicothoracic junction, and thoracic and lumbar spine, were obtained without intravenous contrast. COMPARISON:  Cervical and lumbar spine CT 02/27/2021, CTA chest 09/30/2018, lumbar spine MRI 02/25/2015 FINDINGS: MRI CERVICAL SPINE FINDINGS Alignment: There is slight reversal of Jose normal cervical spine lordosis centered at  C4. There is trace retrolisthesis of C5 on C6. Alignment is otherwise normal. Vertebrae: Vertebral body heights are preserved. Heterogeneous marrow signal at C3 through C7 is likely degenerative in nature. There is no suspicious marrow signal abnormality. Cord: Jose cord is compressed from C3-C4 through C5-C6. There is suspected signal abnormality in Jose left aspect of Jose cord at C3-C4 (4-14). No other definite cord signal abnormality is seen. Posterior Fossa, vertebral arteries, paraspinal tissues: Jose imaged posterior fossa is unremarkable. Jose paraspinal soft tissues are unremarkable. Jose left vertebral artery flow void is present. Jose right vertebral artery flow void is not definitely seen. Disc levels: There is marked disc space narrowing at C3-C4 through C6-C7. C2-C3: There is mild uncovertebral and bilateral facet arthropathy resulting in mild spinal canal stenosis without significant neural foraminal stenosis C3-C4: There is a posterior disc osteophyte complex with a prominent broad-based protrusion, and uncovertebral and bilateral facet arthropathy resulting in severe spinal canal stenosis with cord compression and possible cord signal abnormality as above, and severe bilateral neural foraminal stenosis. C4-C5: There is a posterior disc osteophyte complex with a broad-based protrusion and uncovertebral and bilateral facet arthropathy resulting in severe spinal canal stenosis with cord compression  and severe bilateral neural foraminal stenosis. C5-C6: There is a posterior disc osteophyte complex with a left paracentral protrusion and uncovertebral and facet arthropathy resulting in severe spinal canal stenosis with cord compression and severe bilateral neural foraminal stenosis. C6-C7: There is a mild posterior disc osteophyte complex and uncovertebral and bilateral facet arthropathy resulting in mild spinal canal stenosis and moderate to severe left and mild right neural foraminal stenosis C7-T1: No significant spinal canal or neural foraminal stenosis. MRI THORACIC SPINE FINDINGS Alignment:  Normal. Vertebrae: Vertebral body heights are preserved. There is mild degenerative endplate marrow signal abnormality at T10-T11 with associated prominent Schmorl's nodes indenting Jose adjacent endplates. A few smaller Schmorl's nodes are also noted in Jose upper thoracic spine. There is no suspicious marrow signal abnormality. Cord:  Normal in signal and morphology. Paraspinal and other soft tissues: There are moderate size bilateral pleural effusions. Paraspinal soft tissues are unremarkable. Disc levels: There is multilevel disc space narrowing and mild degenerative endplate change. There is no significant spinal canal or neural foraminal stenosis. MRI LUMBAR SPINE FINDINGS Segmentation: Standard; Jose lowest formed disc space is designated L5-S1. Alignment:  Normal. Vertebrae: Vertebral body heights are preserved. There is degenerative endplate marrow signal abnormality at L5-S1. There is no suspicious marrow signal abnormality. Conus medullaris and cauda equina: Conus extends to Jose mid L1 level. Conus and cauda equina appear normal. Paraspinal and other soft tissues: There are innumerable cysts in Jose bilateral atrophic kidneys. A T2 hyperintense lesion in Jose right hepatic lobe likely reflects a cyst. Jose paraspinal soft tissues are unremarkable. Disc levels: There is disc desiccation and narrowing at L5-S1. Jose  disc spaces are otherwise overall preserved. There is relatively mild multilevel facet arthropathy throughout Jose lumbar spine. T12-L1: There is a minimal disc bulge without significant spinal canal or neural foraminal stenosis. L1-L2: There is a minimal disc bulge without significant spinal canal or neural foraminal stenosis. L2-L3: There is a mild disc bulge with bilateral foraminal components and mild degenerative endplate change and bilateral facet arthropathy resulting in mild left worse than right neural foraminal stenosis and mild subarticular zone narrowing without significant spinal canal stenosis. L3-L4: There is a diffuse disc bulge with bilateral foraminal components and degenerative endplate change and bilateral facet arthropathy resulting in mild-to-moderate spinal canal stenosis  with crowding of Jose subarticular zones and moderate bilateral neural foraminal stenosis. L4-L5: There is a diffuse disc bulge with a left foraminal component, degenerative endplate change, and mild bilateral facet arthropathy resulting in mild crowding of Jose subarticular zones without significant spinal canal stenosis and moderate left and mild right neural foraminal stenosis. L5-S1: There is a disc bulge eccentric to Jose right with a right foraminal component, degenerative endplate change, and bilateral facet arthropathy resulting in mild crowding of Jose subarticular zones, right worse than left, without evidence of nerve root impingement, and moderate to severe right and mild left neural foraminal stenosis. IMPRESSION: CERVICAL SPINE MRI: 1. Advanced degenerative change in Jose cervical spine as detailed above resulting in severe spinal canal stenosis with cord compression from C3-C4 through C5-C6. There is possible cord signal abnormality in Jose left aspect of Jose cord at C3-C4 which may reflect compressive myelopathy or myelomalacia. 2. Severe bilateral neural foraminal stenosis at C3-C4 through C5-C6 and moderate to  severe left neural foraminal stenosis at C6-C7. 3. Jose right vertebral artery flow void is not definitely seen which could reflect chronic occlusion. This could be further evaluated with CTA or MRA of Jose neck as indicated. THORACIC SPINE MRI: 1. Mild multilevel degenerative changes. No high-grade spinal canal or neural foraminal stenosis. 2. Moderate bilateral pleural effusions. LUMBAR SPINE MRI: 1. Mild-to-moderate spinal canal stenosis at L3-L4 with crowding of Jose subarticular zones but no definite evidence of nerve root impingement. There is more mild subarticular zone narrowing at L2-L3, L4-L5, and L5-S1 also without evidence of nerve root impingement. 2. Moderate bilateral neural foraminal stenosis at L3-L4, moderate left neural foraminal stenosis at L4-L5, and moderate to severe right neural foraminal stenosis at L5-S1. 3. Relatively mild facet arthropathy throughout Jose lumbar spine. Electronically Signed   By: Valetta Mole M.D.   On: 02/28/2021 11:43   MR LUMBAR SPINE WO CONTRAST  Result Date: 02/28/2021 CLINICAL DATA:  Weakness EXAM: MRI CERVICAL, THORACIC AND LUMBAR SPINE WITHOUT CONTRAST TECHNIQUE: Multiplanar and multiecho pulse sequences of Jose cervical spine, to include Jose craniocervical junction and cervicothoracic junction, and thoracic and lumbar spine, were obtained without intravenous contrast. COMPARISON:  Cervical and lumbar spine CT 02/27/2021, CTA chest 09/30/2018, lumbar spine MRI 02/25/2015 FINDINGS: MRI CERVICAL SPINE FINDINGS Alignment: There is slight reversal of Jose normal cervical spine lordosis centered at C4. There is trace retrolisthesis of C5 on C6. Alignment is otherwise normal. Vertebrae: Vertebral body heights are preserved. Heterogeneous marrow signal at C3 through C7 is likely degenerative in nature. There is no suspicious marrow signal abnormality. Cord: Jose cord is compressed from C3-C4 through C5-C6. There is suspected signal abnormality in Jose left aspect of Jose  cord at C3-C4 (4-14). No other definite cord signal abnormality is seen. Posterior Fossa, vertebral arteries, paraspinal tissues: Jose imaged posterior fossa is unremarkable. Jose paraspinal soft tissues are unremarkable. Jose left vertebral artery flow void is present. Jose right vertebral artery flow void is not definitely seen. Disc levels: There is marked disc space narrowing at C3-C4 through C6-C7. C2-C3: There is mild uncovertebral and bilateral facet arthropathy resulting in mild spinal canal stenosis without significant neural foraminal stenosis C3-C4: There is a posterior disc osteophyte complex with a prominent broad-based protrusion, and uncovertebral and bilateral facet arthropathy resulting in severe spinal canal stenosis with cord compression and possible cord signal abnormality as above, and severe bilateral neural foraminal stenosis. C4-C5: There is a posterior disc osteophyte complex with a broad-based protrusion and uncovertebral and bilateral facet  arthropathy resulting in severe spinal canal stenosis with cord compression and severe bilateral neural foraminal stenosis. C5-C6: There is a posterior disc osteophyte complex with a left paracentral protrusion and uncovertebral and facet arthropathy resulting in severe spinal canal stenosis with cord compression and severe bilateral neural foraminal stenosis. C6-C7: There is a mild posterior disc osteophyte complex and uncovertebral and bilateral facet arthropathy resulting in mild spinal canal stenosis and moderate to severe left and mild right neural foraminal stenosis C7-T1: No significant spinal canal or neural foraminal stenosis. MRI THORACIC SPINE FINDINGS Alignment:  Normal. Vertebrae: Vertebral body heights are preserved. There is mild degenerative endplate marrow signal abnormality at T10-T11 with associated prominent Schmorl's nodes indenting Jose adjacent endplates. A few smaller Schmorl's nodes are also noted in Jose upper thoracic spine. There  is no suspicious marrow signal abnormality. Cord:  Normal in signal and morphology. Paraspinal and other soft tissues: There are moderate size bilateral pleural effusions. Paraspinal soft tissues are unremarkable. Disc levels: There is multilevel disc space narrowing and mild degenerative endplate change. There is no significant spinal canal or neural foraminal stenosis. MRI LUMBAR SPINE FINDINGS Segmentation: Standard; Jose lowest formed disc space is designated L5-S1. Alignment:  Normal. Vertebrae: Vertebral body heights are preserved. There is degenerative endplate marrow signal abnormality at L5-S1. There is no suspicious marrow signal abnormality. Conus medullaris and cauda equina: Conus extends to Jose mid L1 level. Conus and cauda equina appear normal. Paraspinal and other soft tissues: There are innumerable cysts in Jose bilateral atrophic kidneys. A T2 hyperintense lesion in Jose right hepatic lobe likely reflects a cyst. Jose paraspinal soft tissues are unremarkable. Disc levels: There is disc desiccation and narrowing at L5-S1. Jose disc spaces are otherwise overall preserved. There is relatively mild multilevel facet arthropathy throughout Jose lumbar spine. T12-L1: There is a minimal disc bulge without significant spinal canal or neural foraminal stenosis. L1-L2: There is a minimal disc bulge without significant spinal canal or neural foraminal stenosis. L2-L3: There is a mild disc bulge with bilateral foraminal components and mild degenerative endplate change and bilateral facet arthropathy resulting in mild left worse than right neural foraminal stenosis and mild subarticular zone narrowing without significant spinal canal stenosis. L3-L4: There is a diffuse disc bulge with bilateral foraminal components and degenerative endplate change and bilateral facet arthropathy resulting in mild-to-moderate spinal canal stenosis with crowding of Jose subarticular zones and moderate bilateral neural foraminal stenosis.  L4-L5: There is a diffuse disc bulge with a left foraminal component, degenerative endplate change, and mild bilateral facet arthropathy resulting in mild crowding of Jose subarticular zones without significant spinal canal stenosis and moderate left and mild right neural foraminal stenosis. L5-S1: There is a disc bulge eccentric to Jose right with a right foraminal component, degenerative endplate change, and bilateral facet arthropathy resulting in mild crowding of Jose subarticular zones, right worse than left, without evidence of nerve root impingement, and moderate to severe right and mild left neural foraminal stenosis. IMPRESSION: CERVICAL SPINE MRI: 1. Advanced degenerative change in Jose cervical spine as detailed above resulting in severe spinal canal stenosis with cord compression from C3-C4 through C5-C6. There is possible cord signal abnormality in Jose left aspect of Jose cord at C3-C4 which may reflect compressive myelopathy or myelomalacia. 2. Severe bilateral neural foraminal stenosis at C3-C4 through C5-C6 and moderate to severe left neural foraminal stenosis at C6-C7. 3. Jose right vertebral artery flow void is not definitely seen which could reflect chronic occlusion. This could be further evaluated  with CTA or MRA of Jose neck as indicated. THORACIC SPINE MRI: 1. Mild multilevel degenerative changes. No high-grade spinal canal or neural foraminal stenosis. 2. Moderate bilateral pleural effusions. LUMBAR SPINE MRI: 1. Mild-to-moderate spinal canal stenosis at L3-L4 with crowding of Jose subarticular zones but no definite evidence of nerve root impingement. There is more mild subarticular zone narrowing at L2-L3, L4-L5, and L5-S1 also without evidence of nerve root impingement. 2. Moderate bilateral neural foraminal stenosis at L3-L4, moderate left neural foraminal stenosis at L4-L5, and moderate to severe right neural foraminal stenosis at L5-S1. 3. Relatively mild facet arthropathy throughout Jose lumbar  spine. Electronically Signed   By: Valetta Mole M.D.   On: 02/28/2021 11:43    Procedures Procedures   Medications Ordered in ED Medications  oxyCODONE-acetaminophen (PERCOCET/ROXICET) 5-325 MG per tablet 2 tablet (2 tablets Oral Given 02/28/21 0930)  acetaminophen (TYLENOL) tablet 325-650 mg (has no administration in time range)  oxyCODONE-acetaminophen (PERCOCET/ROXICET) 5-325 MG per tablet 1 tablet (has no administration in time range)  amiodarone (PACERONE) tablet 200 mg (has no administration in time range)  amLODipine (NORVASC) tablet 10 mg (has no administration in time range)  atorvastatin (LIPITOR) tablet 80 mg (has no administration in time range)  LORazepam (ATIVAN) tablet 1 mg (has no administration in time range)  docusate sodium (COLACE) capsule 100 mg (has no administration in time range)  lanthanum (FOSRENOL) chewable tablet 2,000 mg (has no administration in time range)  ondansetron (ZOFRAN) tablet 4 mg (has no administration in time range)  pantoprazole (PROTONIX) EC tablet 40 mg (has no administration in time range)  clopidogrel (PLAVIX) tablet 75 mg (has no administration in time range)  vitamin B-12 (CYANOCOBALAMIN) tablet 1,000 mcg (has no administration in time range)  albuterol (VENTOLIN HFA) 108 (90 Base) MCG/ACT inhaler 1-2 puff (has no administration in time range)  heparin injection 5,000 Units (has no administration in time range)  hydrALAZINE (APRESOLINE) tablet 25 mg (has no administration in time range)  oxyCODONE-acetaminophen (PERCOCET/ROXICET) 5-325 MG per tablet 1 tablet (1 tablet Oral Given 02/27/21 1855)    ED Course  I have reviewed Jose triage vital signs and Jose nursing notes.  Pertinent labs & imaging results that were available during my care of Jose patient were reviewed by me and considered in my medical decision making (see chart for details).    MDM Rules/Calculators/A&P                          70 year old male with a history of  amyloidosis, CAD, DJD of Jose lumbar spine, ESRD on HD MWF, GERD, HLD, HTN, PVD Status post right BKA, severe aortic stenosis status post TAVR who presents to Jose emergency department with back pain, inability to perform ADLs, new weakness.  This was brought by Jose patient and Jose Garrett.  Jose patient lives at home with his daughter.  He has been having worsening bilateral upper extremity weakness with worsening neck pain and pain in his lower back.  He has been unable to perform his ADLs at home over Jose past few days and has been languishing.  He also has had a few episodes of urinary and fecal incontinence on himself that is new and concerning to him.  He has had 2 recent falls.  He has chronic low back pain.  He feels that he is losing grip strength in his hands bilaterally and has worsening weakness in his upper extremities bilaterally.  Family worse  have been unable to help him transition to a wheelchair at home and are concerned he may need placement in a SNF.  On arrival, Jose patient was afebrile, hemodynamically stable, with a neurologic exam concerning for bilateral upper extremity weakness with 4-5 strength bilaterally.  Jose patient underwent CT imaging of Jose head, cervical spine, lumbar spine which revealed no acute intracranial process, severe, Multilevel disc herniations, disc osteophyte complexes and facet arthropathy resulting in moderate to severe spinal canal and neural for aminal stenosis from C3-C6.  MRI and/or surgical consultation is suggested for follow-up.  Jose lumbar spine revealed no acute abnormality with multifactorial degenerative changes at L3-L4 and L4-L5 with moderate bilateral lateral recess stenosis and moderate bilateral L3, L4, L5 4 medial narrowing related to disc bulge, reactive endplate spurring and facet hypertrophy.  Given Jose patient's new episodes of incontinence, worsening upper extremity weakness in Jose setting of recent falls, will obtain MRI imaging to  further evaluate.  MRI imaging of Jose spine revealed Jose following: IMPRESSION: CERVICAL SPINE MRI:   1. Advanced degenerative change in Jose cervical spine as detailed above resulting in severe spinal canal stenosis with cord compression from C3-C4 through C5-C6. There is possible cord signal abnormality in Jose left aspect of Jose cord at C3-C4 which may reflect compressive myelopathy or myelomalacia. 2. Severe bilateral neural foraminal stenosis at C3-C4 through C5-C6 and moderate to severe left neural foraminal stenosis at C6-C7. 3. Jose right vertebral artery flow void is not definitely seen which could reflect chronic occlusion. This could be further evaluated with CTA or MRA of Jose neck as indicated.   THORACIC SPINE MRI:   1. Mild multilevel degenerative changes. No high-grade spinal canal or neural foraminal stenosis. 2. Moderate bilateral pleural effusions.   LUMBAR SPINE MRI:   1. Mild-to-moderate spinal canal stenosis at L3-L4 with crowding of Jose subarticular zones but no definite evidence of nerve root impingement. There is more mild subarticular zone narrowing at L2-L3, L4-L5, and L5-S1 also without evidence of nerve root impingement. 2. Moderate bilateral neural foraminal stenosis at L3-L4, moderate left neural foraminal stenosis at L4-L5, and moderate to severe right neural foraminal stenosis at L5-S1. 3. Relatively mild facet arthropathy throughout Jose lumbar spine.  I spoke with Dr. Kathyrn Sheriff of Neurosurgery who will evaluate that patient inpatient and will consider surgical options given Jose patient's many medical comorbidites.  [place] patient states that he had previously been told he was nonoperative candidate.  No evidence for cauda equina on lumbar spine MRI.  Given Jose patient's acute decrease in ability to perform ADLs in [place] setting of his severe degenerative spinal disease, hospitalist medicine was consulted for admission.  Dr. Roosevelt Locks accepted Jose patient in  admission.   Final Clinical Impression(s) / ED Diagnoses Final diagnoses:  Weakness  Spinal stenosis of cervical region  Spinal stenosis of lumbar region, unspecified whether neurogenic claudication present  Cervical myelopathy [facility])    Rx / DC Orders ED Discharge Orders     None        Regan Lemming, MD 02/28/21 1256    Regan Lemming, MD 02/28/21 1256

## 2021-02-28 NOTE — H&P (Signed)
History and Physical    Jose Garrett. XKG:[id number] DOB: 1950/03/13 DOA: 02/27/2021  PCP: [name], NP (Confirm with patient/family/NH records and if not entered, this has to be entered at [facility] point of entry) Patient coming from: Home  I have personally briefly reviewed patient's old medical records in [place]  Chief Complaint: My arms are weak  HPI: Jose Garrett. is a 70 y.o. male with medical history significant of amyloidosis, chronic combined systolic and diastolic CHF, ESRD on HD MWF, CAD, PVD status post right BKA, severe aortic stenosis status post TAVR, progressed with increasing weakness of bilateral arms hands and fingers.  Patient does have chronic ambulation dysfunction but usually able to ambulate use a walker, and about 4 weeks ago, he started to have trouble holding the walker, reduced him to wheelchair-bound for 3+ weeks.  Ex-wife at bedside visited him last week and found that the patient was not able to lift his arm above shoulder which is new.  Patient reported that she normally able to hold a cup with right hand, has had increasing weakness in the right hand and now left hand also involved.  He also complains about tingling sensations from bilateral wrist down, he subjectively the right first 3 fingers.  Denies any trouble breathing.  No neck pain.  He did have chronic back pain and received several injections in the past.  He has poor cardiac condition, CAD with stenting, severe AS s/p TAVR, combined CHF and " another leaking valve" he was told by his cardiology last year that he is a poor candidate for any spinal surgery.  Patient had a complete session of HD yesterday.  ED Course: Cervical spine/thoracic spine/lumbar spine MRI showed significant stenosis C3-C4 to C5-C6 with signs of compressive myelopathy.  Review of Systems: As per HPI otherwise 14 point review of systems negative.    Past Medical History:  Diagnosis Date   Amyloidosis ([place])     Anemia of chronic disease    Anxiety    CAD (coronary artery disease)    a. cardiac cath on 11/25/17 with occlusion of the RCA which could have been his event but the RCA could not be engaged. The RCA filled distally from left to right collaterals. There were no severe stenoses in the left coronary system. Medical therapy recommended, started on plavix.   Cancer [facility])    multiple myeloma   Chronic combined systolic and diastolic CHF (congestive heart failure) ([place])    a. EF dropped to 35-40% with grade 2 DD by echo 11/2017.   DJD (degenerative joint disease), lumbar    Dysrhythmia    afib   ESRD (end stage renal disease) (HCC)    ESRD- HEMO MWF- [address]   GERD (gastroesophageal reflux disease)    Heart murmur    Hyperlipidemia    Hypertension    Myocardial infarction (HCC)    Neuropathy    Peripheral vascular disease (HCC)    Pneumonia    PONV (postoperative nausea and vomiting)    Restless legs    S/P TAVR (transcatheter aortic valve replacement) 10/21/2018   26 mm Edwards Sapien 3 Ultra transcatheter heart valve placed via percutaneous right transfemoral approach    Severe aortic stenosis    severe    Past Surgical History:  Procedure Laterality Date   ABDOMINAL AORTOGRAM W/LOWER EXTREMITY Bilateral 07/16/2018   Procedure: ABDOMINAL AORTOGRAM W/LOWER EXTREMITY;  Surgeon: Wellington Hampshire, MD;  Location: [place] [facility];  Service: Cardiovascular;  Laterality: Bilateral;   ABDOMINAL AORTOGRAM W/LOWER EXTREMITY N/A 04/09/2019   Procedure: ABDOMINAL AORTOGRAM W/LOWER EXTREMITY - Right Lower;  Surgeon: Waynetta Sandy, MD;  Location: [place] [facility];  Service: Cardiovascular;  Laterality: N/A;   AMPUTATION Left 08/19/2018   Procedure: AMPUTATION LEFT TOES SECOND AND THIRD;  Surgeon: Waynetta Sandy, MD;  Location: [place];  Service: Vascular;  Laterality: Left;   AMPUTATION Right 05/07/2019   Procedure: AMPUTATION BELOW KNEE;  Surgeon: Serafina Mitchell, MD;  Location: [place];  Service: Vascular;  Laterality: Right;   AV FISTULA PLACEMENT Right 12/27/2015   Procedure: Right Arm ARTERIOVENOUS (AV) FISTULA CREATION;  Surgeon: Angelia Mould, MD;  Location: [place];  Service: Vascular;  Laterality: Right;   CARDIAC CATHETERIZATION     CARDIAC VALVE REPLACEMENT     CATARACT EXTRACTION W/ INTRAOCULAR LENS  IMPLANT, BILATERAL     COLONOSCOPY     ERCP W/ SPHICTEROTOMY     EYE SURGERY     FEMORAL-POPLITEAL BYPASS GRAFT Left 07/22/2018   Procedure: left FEMORAL TO BELOW KNEE POPLITEAL ARTERY BYPASS GREAFT using 46m removable ring propaten gore graft;  Surgeon: CWaynetta Sandy MD;  [place]  Service: Vascular;  Laterality: Left;   FEMORAL-POPLITEAL BYPASS GRAFT Right 04/10/2019   Procedure: BYPASS GRAFT RIGHT FEMORAL-POPLITEAL ARTERY;  Surgeon: ERosetta Posner MD;  [place]  Service: Vascular;  Laterality: Right;   GANGLION CYST EXCISION Left 11/02/2020   Procedure: EXCISION OF LEFT WRIST DORAL VOLAR MASSES;  Surgeon: WCharlotte Crumb MD;  [place] [facility]  Service: Orthopedics;  Laterality: Left;  CPT 25073   I & D EXTREMITY Right 05/05/2019   Procedure: Revision of  RIGHT  FOOT Amputation;  Surgeon: CWaynetta Sandy MD;  [place] [facility]  Service: Vascular;  Laterality: Right;   INTRAOPERATIVE TRANSTHORACIC ECHOCARDIOGRAM N/A 10/21/2018   Procedure: Intraoperative Transthoracic Echocardiogram;  Surgeon: CSherren Mocha MD;  [place]  Service: Open Heart Surgery;  Laterality: N/A;   IR GENERIC HISTORICAL  01/27/2016   IR FLUORO GUIDE CV LINE RIGHT 01/27/2016 DArne Cleveland MD [facility]   IR GENERIC HISTORICAL  01/27/2016   IR [place] ACCESS RIGHT 01/27/2016 DArne Cleveland MD [facility]   LAPAROSCOPIC CHOLECYSTECTOMY  03/2011   LEFT HEART CATH AND CORONARY ANGIOGRAPHY N/A 11/25/2017   Procedure: LEFT HEART CATH AND CORONARY ANGIOGRAPHY;  Surgeon: JMartinique Peter M, MD;  [place] [facility];  Service: Cardiovascular;  Laterality: N/A;   LEFT HEART CATH AND CORONARY ANGIOGRAPHY N/A 07/24/2018   Procedure: LEFT HEART CATH AND CORONARY ANGIOGRAPHY;  Surgeon: CSherren Mocha MD;  [facility];  Service: Cardiovascular;  Laterality: N/A;   RENAL BIOPSY, PERCUTANEOUS Right 09/08/2014   RIGHT/LEFT HEART CATH AND CORONARY ANGIOGRAPHY N/A 03/25/2018   Procedure: RIGHT/LEFT HEART CATH AND CORONARY ANGIOGRAPHY;  Surgeon: JMartinique Peter M, MD;  [place];  Service: Cardiovascular;  Laterality: N/A;   TEE WITHOUT CARDIOVERSION N/A 05/14/2019   Procedure: TRANSESOPHAGEAL ECHOCARDIOGRAM (TEE);  Surgeon: RFay Records MD;  [facility]  Service: Cardiovascular;  Laterality: N/A;   TOE AMPUTATION Left 08/19/2018   2nd & 3rd toes   TONSILLECTOMY  ~ 1960   TRANSCATHETER AORTIC VALVE REPLACEMENT, TRANSFEMORAL N/A 10/21/2018   Procedure: TRANSCATHETER AORTIC VALVE REPLACEMENT, TRANSFEMORAL;  Surgeon: CSherren Mocha MD;  [place]  Service: Open Heart Surgery;  Laterality: N/A;   TRANSMETATARSAL AMPUTATION Right 04/29/2019  Procedure: RIGHT TRANSMETATARSAL AMPUTATION;  Surgeon: Waynetta Sandy, MD;  Location: [place];  Service: Vascular;  Laterality: Right;   ULTRASOUND GUIDANCE FOR VASCULAR ACCESS  03/25/2018   Procedure: Ultrasound Guidance For Vascular Access;  Surgeon: Martinique, Peter M, MD;  Location: [place] [facility];  Service: Cardiovascular;;     reports that he quit smoking about 9 years ago. His smoking use included cigarettes. He has a 80.00 pack-year smoking history. He has never used smokeless tobacco. He reports that he does not drink alcohol and does not use drugs.  Allergies  Allergen Reactions   Levonorgestrel-Ethinyl Estrad Swelling    Family History  Problem Relation Age of Onset   Hypertension Mother    Diabetes Mother    Heart disease Mother    Skin cancer Mother    Hypertension Father     Diabetes Father    Diabetes Sister      Prior to Admission medications   Medication Sig Start Date End Date Taking? Authorizing Provider  albuterol (VENTOLIN HFA) 108 (90 Base) MCG/ACT inhaler Inhale 1 puff into the lungs every 6 hours as needed Patient taking differently: Inhale 1-2 puffs into the lungs every 6 (six) hours as needed for wheezing or shortness of breath. 01/24/21  Yes   amiodarone (PACERONE) 200 MG tablet TAKE 1 TABLET (200 MG TOTAL) BY MOUTH DAILY. Patient taking differently: Take 200 mg by mouth daily. 09/07/20 09/07/21 Yes Burnell Blanks, MD  amLODipine (NORVASC) 10 MG tablet Take 1 tablet (10 mg total) by mouth at bedtime. 10/18/20  Yes Oswald Hillock, MD  atorvastatin (LIPITOR) 80 MG tablet TAKE 1 TABLET BY MOUTH DAILY WITH SUPPER Patient taking differently: Take 80 mg by mouth daily with supper. 04/28/20 04/28/21 Yes Burnell Blanks, MD  B Complex-C-Folic Acid (RENA-VITE RX) 1 MG TABS Take 1 tablet by mouth once a day 10/19/20  Yes   clopidogrel (PLAVIX) 75 MG tablet TAKE 1 TABLET BY MOUTH DAILY WITH SUPPER Patient taking differently: Take 75 mg by mouth daily with supper. 04/28/20 04/28/21 Yes Burnell Blanks, MD  Darbepoetin Alfa (ARANESP) 100 MCG/0.5ML SOSY injection Inject 0.5 mLs (100 mcg total) into the vein every Friday with hemodialysis. 05/29/19  Yes Angiulli, Lavon Paganini, PA-C  docusate sodium (COLACE) 100 MG capsule Take 1 capsule (100 mg total) by mouth 2 (two) times daily. Patient taking differently: Take 100 mg by mouth daily as needed for moderate constipation. 05/27/19  Yes Angiulli, Lavon Paganini, PA-C  ethyl chloride spray Apply 1 spray to dialysis access prior to dialysis as directed. 01/18/21  Yes   lanthanum (FOSRENOL) 1000 MG chewable tablet Chew 2,000 mg by mouth 3 (three) times daily. 07/15/20  Yes [provider]  LORazepam (ATIVAN) 1 MG tablet Take 1 tablet (1 mg total) by mouth 2 (two) times daily as needed for anxiety. 11/10/20  Yes  Pahwani, Einar Grad, MD  multivitamin (RENA-VIT) TABS tablet Take 1 tablet by mouth at bedtime. 08/12/18  Yes Angiulli, Lavon Paganini, PA-C  ondansetron (ZOFRAN) 4 MG tablet TAKE 1 TABLET BY MOUTH TWICE A DAY AS NEEDED Patient taking differently: Take 4 mg by mouth 2 (two) times daily as needed for nausea or vomiting. 06/28/20 06/28/21 Yes Wellington Hampshire, MD  oxyCODONE-acetaminophen (PERCOCET) 5-325 MG tablet Take 1 tablet by mouth every 4 (four) hours as needed for severe pain. 11/10/20 11/10/21 Yes Pahwani, Einar Grad, MD  pantoprazole (PROTONIX) 40 MG tablet Take 1 tablet (40 mg total) by mouth daily. 05/28/19  Yes  Angiulli, Lavon Paganini, PA-C  vitamin B-12 (CYANOCOBALAMIN) 1000 MCG tablet Take 1 tablet (1,000 mcg total) by mouth every evening. 05/27/19  Yes Angiulli, Lavon Paganini, PA-C  acetaminophen (TYLENOL) 325 MG tablet Take 1-2 tablets (325-650 mg total) by mouth every 4 (four) hours as needed for mild pain (or temp >/= 101 F). 05/27/19   Cathlyn Parsons, PA-C    Physical Exam: Vitals:   02/28/21 0815 02/28/21 0830 02/28/21 0845 02/28/21 1145  BP: (!) 160/49 (!) 161/53 (!) 149/72 (!) 196/170  Pulse: 85 86 81 84  Resp: 14 (!) _0 Temp:      TempSrc:      SpO2: 94% 99% 97% (!) 88%  Weight:      Height:        Constitutional: NAD, calm, comfortable Vitals:   02/28/21 0815 02/28/21 0830 02/28/21 0845 02/28/21 1145  BP: (!) 160/49 (!) 161/53 (!) 149/72 (!) 196/170  Pulse: 85 86 81 84  Resp: 14 (!) _1 Temp:      TempSrc:      SpO2: 94% 99% 97% (!) 88%  Weight:      Height:       Eyes: PERRL, lids and conjunctivae normal ENMT: Mucous membranes are moist. Posterior pharynx clear of any exudate or lesions.Normal dentition.  Neck: normal, supple, no masses, no thyromegaly Respiratory: clear to auscultation bilaterally, no wheezing, no crackles. Normal respiratory effort. No accessory muscle use.  Cardiovascular: Regular rate and rhythm, no murmurs / rubs / gallops. No extremity edema. 2+ pedal  pulses. No carotid bruits.  Abdomen: no tenderness, no masses palpated. No hepatosplenomegaly. Bowel sounds positive.  Musculoskeletal: no clubbing / cyanosis. No joint deformity upper and lower extremities. Good ROM, no contractures. Normal muscle tone.  Skin: no rashes, lesions, ulcers. No induration Neurologic: Muscle strength 3/5 on bilateral upper arm and forearm, 4/5 on bilateral wrist and hand and fingers, left leg strength 4/5, decreased light touch sensation on right 3 fingers and left hand.  DTR normal.  Psychiatric: Normal judgment and insight. Alert and oriented x 3. Normal mood.     Labs on Admission: I have personally reviewed following labs and imaging studies  CBC: Recent Labs  Lab 02/27/21 1901  WBC 6.1  HGB 9.2*  HCT 28.2*  MCV 97.2  PLT 937*   Basic Metabolic Panel: Recent Labs  Lab 02/27/21 1901  NA 137  K 3.9  CL 94*  CO2 29  GLUCOSE 84  BUN 23  CREATININE 5.12*  CALCIUM 9.1  MG 2.1   GFR: Estimated Creatinine Clearance: 14.3 mL/min (A) (by C-G formula based on SCr of 5.12 mg/dL (H)). Liver Function Tests: Recent Labs  Lab 02/27/21 1901  AST 29  ALT 18  ALKPHOS 67  BILITOT 1.3*  PROT 6.9  ALBUMIN 3.6   No results for input(s): LIPASE, AMYLASE in the last 168 hours. No results for input(s): AMMONIA in the last 168 hours. Coagulation Profile: No results for input(s): INR, PROTIME in the last 168 hours. Cardiac Enzymes: No results for input(s): CKTOTAL, CKMB, CKMBINDEX, TROPONINI in the last 168 hours. BNP (last 3 results) No results for input(s): PROBNP in the last 8760 hours. HbA1C: No results for input(s): HGBA1C in the last 72 hours. CBG: Recent Labs  Lab 02/27/21 1901  GLUCAP 112*   Lipid Profile: No results for input(s): CHOL, HDL, LDLCALC, TRIG, CHOLHDL, LDLDIRECT in the last 72 hours. Thyroid Function Tests: No results for input(s): TSH, T4TOTAL, FREET4,  T3FREE, THYROIDAB in the last 72 hours. Anemia Panel: No results for  input(s): VITAMINB12, FOLATE, FERRITIN, TIBC, IRON, RETICCTPCT in the last 72 hours. Urine analysis:    Component Value Date/Time   COLORURINE AMBER (A) 02/01/2016 1707   APPEARANCEUR CLOUDY (A) 02/01/2016 1707   LABSPEC 1.020 02/01/2016 1707   PHURINE 5.5 02/01/2016 1707   GLUCOSEU 100 (A) 02/01/2016 1707   HGBUR NEGATIVE 02/01/2016 1707   BILIRUBINUR SMALL (A) 02/01/2016 1707   KETONESUR NEGATIVE 02/01/2016 1707   PROTEINUR >300 (A) 02/01/2016 1707   NITRITE NEGATIVE 02/01/2016 1707   LEUKOCYTESUR TRACE (A) 02/01/2016 1707    Radiological Exams on Admission: CT HEAD WO CONTRAST (5MM)  Result Date: 02/27/2021 CLINICAL DATA:  Mental status change, unknown cause. EXAM: CT HEAD WITHOUT CONTRAST CT CERVICAL SPINE WITHOUT CONTRAST TECHNIQUE: Multidetector CT imaging of the head and cervical spine was performed following the standard protocol without intravenous contrast. Multiplanar CT image reconstructions of the cervical spine were also generated. COMPARISON:  11/03/2020. FINDINGS: CT HEAD FINDINGS Brain: No acute intracranial hemorrhage, midline shift or mass effect. Diffuse atrophy is noted. Mild periventricular white matter hypodensities are seen bilaterally. There is no hydrocephalus. Vascular: Atherosclerotic calcification of the vertebral arteries and carotid siphons. No hyperdense vessel. Skull: Normal. Negative for fracture or focal lesion. Sinuses/Orbits: No acute finding. Other: None. CT CERVICAL SPINE FINDINGS Alignment: There is mild retrolisthesis at C5-C6 and mild kyphosis of the mid cervical spine. Skull base and vertebrae: No acute fracture. No primary bone lesion or focal pathologic process. Soft tissues and spinal canal: No prevertebral fluid or swelling. No visible canal hematoma. Disc levels: Multilevel intervertebral disc space narrowing, osteophyte formation, and facet arthropathy is noted at multiple levels. There are disc osteophyte complexes at C3-C4, C4-C5, and C5-C6  resulting in moderate to severe spinal canal and neural foraminal stenosis. Upper chest: There are moderate layering bilateral pleural effusions with hazy airspace opacities in the lungs. Other: Atherosclerotic calcification of the carotid arteries. IMPRESSION: 1. No acute intracranial process. 2. Atrophy with chronic microvascular ischemic changes. 3. No acute fracture or subluxation in the cervical spine. 4. Multilevel disc herniations, disc osteophyte complexes and facet arthropathy resulting in moderate to severe spinal canal and neural foraminal stenosis from C3-C6. MRI and/or surgical consultation is suggested for follow-up. Electronically Signed   By: Brett Fairy M.D.   On: 02/27/2021 21:18   CT Cervical Spine Wo Contrast  Result Date: 02/27/2021 CLINICAL DATA:  Mental status change, unknown cause. EXAM: CT HEAD WITHOUT CONTRAST CT CERVICAL SPINE WITHOUT CONTRAST TECHNIQUE: Multidetector CT imaging of the head and cervical spine was performed following the standard protocol without intravenous contrast. Multiplanar CT image reconstructions of the cervical spine were also generated. COMPARISON:  11/03/2020. FINDINGS: CT HEAD FINDINGS Brain: No acute intracranial hemorrhage, midline shift or mass effect. Diffuse atrophy is noted. Mild periventricular white matter hypodensities are seen bilaterally. There is no hydrocephalus. Vascular: Atherosclerotic calcification of the vertebral arteries and carotid siphons. No hyperdense vessel. Skull: Normal. Negative for fracture or focal lesion. Sinuses/Orbits: No acute finding. Other: None. CT CERVICAL SPINE FINDINGS Alignment: There is mild retrolisthesis at C5-C6 and mild kyphosis of the mid cervical spine. Skull base and vertebrae: No acute fracture. No primary bone lesion or focal pathologic process. Soft tissues and spinal canal: No prevertebral fluid or swelling. No visible canal hematoma. Disc levels: Multilevel intervertebral disc space narrowing,  osteophyte formation, and facet arthropathy is noted at multiple levels. There are disc osteophyte complexes at C3-C4, C4-C5,  and C5-C6 resulting in moderate to severe spinal canal and neural foraminal stenosis. Upper chest: There are moderate layering bilateral pleural effusions with hazy airspace opacities in the lungs. Other: Atherosclerotic calcification of the carotid arteries. IMPRESSION: 1. No acute intracranial process. 2. Atrophy with chronic microvascular ischemic changes. 3. No acute fracture or subluxation in the cervical spine. 4. Multilevel disc herniations, disc osteophyte complexes and facet arthropathy resulting in moderate to severe spinal canal and neural foraminal stenosis from C3-C6. MRI and/or surgical consultation is suggested for follow-up. Electronically Signed   By: Brett Fairy M.D.   On: 02/27/2021 21:18   CT Lumbar Spine Wo Contrast  Result Date: 02/27/2021 CLINICAL DATA:  Initial evaluation for low back pain, weakness. EXAM: CT LUMBAR SPINE WITHOUT CONTRAST TECHNIQUE: Multidetector CT imaging of the lumbar spine was performed without intravenous contrast administration. Multiplanar CT image reconstructions were also generated. COMPARISON:  Prior MRI from 02/25/2015. FINDINGS: Segmentation: Standard. Lowest well-formed disc space labeled the L5-S1 level. Alignment: Physiologic with preservation of the normal lumbar lordosis. No listhesis. Vertebrae: Vertebral body height maintained without acute or chronic fracture. Visualized sacrum and pelvis intact. SI joints symmetric and normal. No discrete or worrisome osseous lesions. Paraspinal and other soft tissues: Paraspinous soft tissues demonstrate no acute finding. Extensive arteriosclerosis seen throughout the visualized abdomen and pelvis. Visualized native kidneys are atrophic, compatible with history of end-stage renal disease. Approximate 1.5 cm benign appearing cyst noted at the left kidney. Bilateral pleural effusions  partially visualized. Disc levels: L1-2: Minimal annular disc bulge with mild facet hypertrophy. No spinal stenosis. Foramina remain patent. L2-3: Mild annular disc bulge with bilateral facet hypertrophy. Resultant mild narrowing of the lateral recesses bilaterally. Central canal remains patent. Foramina remain patent. L3-4: Mild diffuse disc bulge with associated biforaminal disc protrusions. Mild to moderate right greater than left facet hypertrophy. Resultant mild canal with moderate bilateral lateral recess stenosis. Moderate bilateral L3 foraminal stenosis. L4-5: Diffuse disc bulge, slightly asymmetric to the left. Mild to moderate bilateral facet hypertrophy. Resultant moderate narrowing of the lateral recesses bilaterally. Central canal remains patent. Moderate left with mild to moderate right L4 foraminal stenosis. L5-S1: Advanced degenerative intervertebral disc space narrowing with disc desiccation. Associated reactive endplate spurring. Broad-based posterior disc bulge contacts both of the descending S1 nerve roots as they course through the lateral recesses. No frank neural impingement or displacement. Moderate facet hypertrophy. No significant spinal stenosis. Mild to moderate right worse than left L5 foraminal narrowing. IMPRESSION: 1. No acute abnormality within the lumbar spine. 2. Multifactorial degenerative changes at L3-4 and L4-5 with resultant moderate bilateral lateral recess stenosis. 3. Moderate bilateral L3, L4, and L5 foraminal narrowing related to disc bulge, reactive endplate spurring, and facet hypertrophy. 4. Bilateral pleural effusions, partially visualized. 5.  Aortic Atherosclerosis (ICD10-I70.0). Electronically Signed   By: Jeannine Boga M.D.   On: 02/27/2021 21:35   MR Cervical Spine Wo Contrast  Result Date: 02/28/2021 CLINICAL DATA:  Weakness EXAM: MRI CERVICAL, THORACIC AND LUMBAR SPINE WITHOUT CONTRAST TECHNIQUE: Multiplanar and multiecho pulse sequences of the  cervical spine, to include the craniocervical junction and cervicothoracic junction, and thoracic and lumbar spine, were obtained without intravenous contrast. COMPARISON:  Cervical and lumbar spine CT 02/27/2021, CTA chest 09/30/2018, lumbar spine MRI 02/25/2015 FINDINGS: MRI CERVICAL SPINE FINDINGS Alignment: There is slight reversal of the normal cervical spine lordosis centered at C4. There is trace retrolisthesis of C5 on C6. Alignment is otherwise normal. Vertebrae: Vertebral body heights are preserved. Heterogeneous marrow signal at  C3 through C7 is likely degenerative in nature. There is no suspicious marrow signal abnormality. Cord: The cord is compressed from C3-C4 through C5-C6. There is suspected signal abnormality in the left aspect of the cord at C3-C4 (4-14). No other definite cord signal abnormality is seen. Posterior Fossa, vertebral arteries, paraspinal tissues: The imaged posterior fossa is unremarkable. The paraspinal soft tissues are unremarkable. The left vertebral artery flow void is present. The right vertebral artery flow void is not definitely seen. Disc levels: There is marked disc space narrowing at C3-C4 through C6-C7. C2-C3: There is mild uncovertebral and bilateral facet arthropathy resulting in mild spinal canal stenosis without significant neural foraminal stenosis C3-C4: There is a posterior disc osteophyte complex with a prominent broad-based protrusion, and uncovertebral and bilateral facet arthropathy resulting in severe spinal canal stenosis with cord compression and possible cord signal abnormality as above, and severe bilateral neural foraminal stenosis. C4-C5: There is a posterior disc osteophyte complex with a broad-based protrusion and uncovertebral and bilateral facet arthropathy resulting in severe spinal canal stenosis with cord compression and severe bilateral neural foraminal stenosis. C5-C6: There is a posterior disc osteophyte complex with a left paracentral  protrusion and uncovertebral and facet arthropathy resulting in severe spinal canal stenosis with cord compression and severe bilateral neural foraminal stenosis. C6-C7: There is a mild posterior disc osteophyte complex and uncovertebral and bilateral facet arthropathy resulting in mild spinal canal stenosis and moderate to severe left and mild right neural foraminal stenosis C7-T1: No significant spinal canal or neural foraminal stenosis. MRI THORACIC SPINE FINDINGS Alignment:  Normal. Vertebrae: Vertebral body heights are preserved. There is mild degenerative endplate marrow signal abnormality at T10-T11 with associated prominent Schmorl's nodes indenting the adjacent endplates. A few smaller Schmorl's nodes are also noted in the upper thoracic spine. There is no suspicious marrow signal abnormality. Cord:  Normal in signal and morphology. Paraspinal and other soft tissues: There are moderate size bilateral pleural effusions. Paraspinal soft tissues are unremarkable. Disc levels: There is multilevel disc space narrowing and mild degenerative endplate change. There is no significant spinal canal or neural foraminal stenosis. MRI LUMBAR SPINE FINDINGS Segmentation: Standard; the lowest formed disc space is designated L5-S1. Alignment:  Normal. Vertebrae: Vertebral body heights are preserved. There is degenerative endplate marrow signal abnormality at L5-S1. There is no suspicious marrow signal abnormality. Conus medullaris and cauda equina: Conus extends to the mid L1 level. Conus and cauda equina appear normal. Paraspinal and other soft tissues: There are innumerable cysts in the bilateral atrophic kidneys. A T2 hyperintense lesion in the right hepatic lobe likely reflects a cyst. The paraspinal soft tissues are unremarkable. Disc levels: There is disc desiccation and narrowing at L5-S1. The disc spaces are otherwise overall preserved. There is relatively mild multilevel facet arthropathy throughout the lumbar  spine. T12-L1: There is a minimal disc bulge without significant spinal canal or neural foraminal stenosis. L1-L2: There is a minimal disc bulge without significant spinal canal or neural foraminal stenosis. L2-L3: There is a mild disc bulge with bilateral foraminal components and mild degenerative endplate change and bilateral facet arthropathy resulting in mild left worse than right neural foraminal stenosis and mild subarticular zone narrowing without significant spinal canal stenosis. L3-L4: There is a diffuse disc bulge with bilateral foraminal components and degenerative endplate change and bilateral facet arthropathy resulting in mild-to-moderate spinal canal stenosis with crowding of the subarticular zones and moderate bilateral neural foraminal stenosis. L4-L5: There is a diffuse disc bulge with a left foraminal  component, degenerative endplate change, and mild bilateral facet arthropathy resulting in mild crowding of the subarticular zones without significant spinal canal stenosis and moderate left and mild right neural foraminal stenosis. L5-S1: There is a disc bulge eccentric to the right with a right foraminal component, degenerative endplate change, and bilateral facet arthropathy resulting in mild crowding of the subarticular zones, right worse than left, without evidence of nerve root impingement, and moderate to severe right and mild left neural foraminal stenosis. IMPRESSION: CERVICAL SPINE MRI: 1. Advanced degenerative change in the cervical spine as detailed above resulting in severe spinal canal stenosis with cord compression from C3-C4 through C5-C6. There is possible cord signal abnormality in the left aspect of the cord at C3-C4 which may reflect compressive myelopathy or myelomalacia. 2. Severe bilateral neural foraminal stenosis at C3-C4 through C5-C6 and moderate to severe left neural foraminal stenosis at C6-C7. 3. The right vertebral artery flow void is not definitely seen which could  reflect chronic occlusion. This could be further evaluated with CTA or MRA of the neck as indicated. THORACIC SPINE MRI: 1. Mild multilevel degenerative changes. No high-grade spinal canal or neural foraminal stenosis. 2. Moderate bilateral pleural effusions. LUMBAR SPINE MRI: 1. Mild-to-moderate spinal canal stenosis at L3-L4 with crowding of the subarticular zones but no definite evidence of nerve root impingement. There is more mild subarticular zone narrowing at L2-L3, L4-L5, and L5-S1 also without evidence of nerve root impingement. 2. Moderate bilateral neural foraminal stenosis at L3-L4, moderate left neural foraminal stenosis at L4-L5, and moderate to severe right neural foraminal stenosis at L5-S1. 3. Relatively mild facet arthropathy throughout the lumbar spine. Electronically Signed   By: Valetta Mole M.D.   On: 02/28/2021 11:43   MR THORACIC SPINE WO CONTRAST  Result Date: 02/28/2021 CLINICAL DATA:  Weakness EXAM: MRI CERVICAL, THORACIC AND LUMBAR SPINE WITHOUT CONTRAST TECHNIQUE: Multiplanar and multiecho pulse sequences of the cervical spine, to include the craniocervical junction and cervicothoracic junction, and thoracic and lumbar spine, were obtained without intravenous contrast. COMPARISON:  Cervical and lumbar spine CT 02/27/2021, CTA chest 09/30/2018, lumbar spine MRI 02/25/2015 FINDINGS: MRI CERVICAL SPINE FINDINGS Alignment: There is slight reversal of the normal cervical spine lordosis centered at C4. There is trace retrolisthesis of C5 on C6. Alignment is otherwise normal. Vertebrae: Vertebral body heights are preserved. Heterogeneous marrow signal at C3 through C7 is likely degenerative in nature. There is no suspicious marrow signal abnormality. Cord: The cord is compressed from C3-C4 through C5-C6. There is suspected signal abnormality in the left aspect of the cord at C3-C4 (4-14). No other definite cord signal abnormality is seen. Posterior Fossa, vertebral arteries, paraspinal  tissues: The imaged posterior fossa is unremarkable. The paraspinal soft tissues are unremarkable. The left vertebral artery flow void is present. The right vertebral artery flow void is not definitely seen. Disc levels: There is marked disc space narrowing at C3-C4 through C6-C7. C2-C3: There is mild uncovertebral and bilateral facet arthropathy resulting in mild spinal canal stenosis without significant neural foraminal stenosis C3-C4: There is a posterior disc osteophyte complex with a prominent broad-based protrusion, and uncovertebral and bilateral facet arthropathy resulting in severe spinal canal stenosis with cord compression and possible cord signal abnormality as above, and severe bilateral neural foraminal stenosis. C4-C5: There is a posterior disc osteophyte complex with a broad-based protrusion and uncovertebral and bilateral facet arthropathy resulting in severe spinal canal stenosis with cord compression and severe bilateral neural foraminal stenosis. C5-C6: There is a posterior disc osteophyte  complex with a left paracentral protrusion and uncovertebral and facet arthropathy resulting in severe spinal canal stenosis with cord compression and severe bilateral neural foraminal stenosis. C6-C7: There is a mild posterior disc osteophyte complex and uncovertebral and bilateral facet arthropathy resulting in mild spinal canal stenosis and moderate to severe left and mild right neural foraminal stenosis C7-T1: No significant spinal canal or neural foraminal stenosis. MRI THORACIC SPINE FINDINGS Alignment:  Normal. Vertebrae: Vertebral body heights are preserved. There is mild degenerative endplate marrow signal abnormality at T10-T11 with associated prominent Schmorl's nodes indenting the adjacent endplates. A few smaller Schmorl's nodes are also noted in the upper thoracic spine. There is no suspicious marrow signal abnormality. Cord:  Normal in signal and morphology. Paraspinal and other soft tissues:  There are moderate size bilateral pleural effusions. Paraspinal soft tissues are unremarkable. Disc levels: There is multilevel disc space narrowing and mild degenerative endplate change. There is no significant spinal canal or neural foraminal stenosis. MRI LUMBAR SPINE FINDINGS Segmentation: Standard; the lowest formed disc space is designated L5-S1. Alignment:  Normal. Vertebrae: Vertebral body heights are preserved. There is degenerative endplate marrow signal abnormality at L5-S1. There is no suspicious marrow signal abnormality. Conus medullaris and cauda equina: Conus extends to the mid L1 level. Conus and cauda equina appear normal. Paraspinal and other soft tissues: There are innumerable cysts in the bilateral atrophic kidneys. A T2 hyperintense lesion in the right hepatic lobe likely reflects a cyst. The paraspinal soft tissues are unremarkable. Disc levels: There is disc desiccation and narrowing at L5-S1. The disc spaces are otherwise overall preserved. There is relatively mild multilevel facet arthropathy throughout the lumbar spine. T12-L1: There is a minimal disc bulge without significant spinal canal or neural foraminal stenosis. L1-L2: There is a minimal disc bulge without significant spinal canal or neural foraminal stenosis. L2-L3: There is a mild disc bulge with bilateral foraminal components and mild degenerative endplate change and bilateral facet arthropathy resulting in mild left worse than right neural foraminal stenosis and mild subarticular zone narrowing without significant spinal canal stenosis. L3-L4: There is a diffuse disc bulge with bilateral foraminal components and degenerative endplate change and bilateral facet arthropathy resulting in mild-to-moderate spinal canal stenosis with crowding of the subarticular zones and moderate bilateral neural foraminal stenosis. L4-L5: There is a diffuse disc bulge with a left foraminal component, degenerative endplate change, and mild bilateral  facet arthropathy resulting in mild crowding of the subarticular zones without significant spinal canal stenosis and moderate left and mild right neural foraminal stenosis. L5-S1: There is a disc bulge eccentric to the right with a right foraminal component, degenerative endplate change, and bilateral facet arthropathy resulting in mild crowding of the subarticular zones, right worse than left, without evidence of nerve root impingement, and moderate to severe right and mild left neural foraminal stenosis. IMPRESSION: CERVICAL SPINE MRI: 1. Advanced degenerative change in the cervical spine as detailed above resulting in severe spinal canal stenosis with cord compression from C3-C4 through C5-C6. There is possible cord signal abnormality in the left aspect of the cord at C3-C4 which may reflect compressive myelopathy or myelomalacia. 2. Severe bilateral neural foraminal stenosis at C3-C4 through C5-C6 and moderate to severe left neural foraminal stenosis at C6-C7. 3. The right vertebral artery flow void is not definitely seen which could reflect chronic occlusion. This could be further evaluated with CTA or MRA of the neck as indicated. THORACIC SPINE MRI: 1. Mild multilevel degenerative changes. No high-grade spinal canal or neural  foraminal stenosis. 2. Moderate bilateral pleural effusions. LUMBAR SPINE MRI: 1. Mild-to-moderate spinal canal stenosis at L3-L4 with crowding of the subarticular zones but no definite evidence of nerve root impingement. There is more mild subarticular zone narrowing at L2-L3, L4-L5, and L5-S1 also without evidence of nerve root impingement. 2. Moderate bilateral neural foraminal stenosis at L3-L4, moderate left neural foraminal stenosis at L4-L5, and moderate to severe right neural foraminal stenosis at L5-S1. 3. Relatively mild facet arthropathy throughout the lumbar spine. Electronically Signed   By: Valetta Mole M.D.   On: 02/28/2021 11:43   MR LUMBAR SPINE WO CONTRAST  Result  Date: 02/28/2021 CLINICAL DATA:  Weakness EXAM: MRI CERVICAL, THORACIC AND LUMBAR SPINE WITHOUT CONTRAST TECHNIQUE: Multiplanar and multiecho pulse sequences of the cervical spine, to include the craniocervical junction and cervicothoracic junction, and thoracic and lumbar spine, were obtained without intravenous contrast. COMPARISON:  Cervical and lumbar spine CT 02/27/2021, CTA chest 09/30/2018, lumbar spine MRI 02/25/2015 FINDINGS: MRI CERVICAL SPINE FINDINGS Alignment: There is slight reversal of the normal cervical spine lordosis centered at C4. There is trace retrolisthesis of C5 on C6. Alignment is otherwise normal. Vertebrae: Vertebral body heights are preserved. Heterogeneous marrow signal at C3 through C7 is likely degenerative in nature. There is no suspicious marrow signal abnormality. Cord: The cord is compressed from C3-C4 through C5-C6. There is suspected signal abnormality in the left aspect of the cord at C3-C4 (4-14). No other definite cord signal abnormality is seen. Posterior Fossa, vertebral arteries, paraspinal tissues: The imaged posterior fossa is unremarkable. The paraspinal soft tissues are unremarkable. The left vertebral artery flow void is present. The right vertebral artery flow void is not definitely seen. Disc levels: There is marked disc space narrowing at C3-C4 through C6-C7. C2-C3: There is mild uncovertebral and bilateral facet arthropathy resulting in mild spinal canal stenosis without significant neural foraminal stenosis C3-C4: There is a posterior disc osteophyte complex with a prominent broad-based protrusion, and uncovertebral and bilateral facet arthropathy resulting in severe spinal canal stenosis with cord compression and possible cord signal abnormality as above, and severe bilateral neural foraminal stenosis. C4-C5: There is a posterior disc osteophyte complex with a broad-based protrusion and uncovertebral and bilateral facet arthropathy resulting in severe spinal  canal stenosis with cord compression and severe bilateral neural foraminal stenosis. C5-C6: There is a posterior disc osteophyte complex with a left paracentral protrusion and uncovertebral and facet arthropathy resulting in severe spinal canal stenosis with cord compression and severe bilateral neural foraminal stenosis. C6-C7: There is a mild posterior disc osteophyte complex and uncovertebral and bilateral facet arthropathy resulting in mild spinal canal stenosis and moderate to severe left and mild right neural foraminal stenosis C7-T1: No significant spinal canal or neural foraminal stenosis. MRI THORACIC SPINE FINDINGS Alignment:  Normal. Vertebrae: Vertebral body heights are preserved. There is mild degenerative endplate marrow signal abnormality at T10-T11 with associated prominent Schmorl's nodes indenting the adjacent endplates. A few smaller Schmorl's nodes are also noted in the upper thoracic spine. There is no suspicious marrow signal abnormality. Cord:  Normal in signal and morphology. Paraspinal and other soft tissues: There are moderate size bilateral pleural effusions. Paraspinal soft tissues are unremarkable. Disc levels: There is multilevel disc space narrowing and mild degenerative endplate change. There is no significant spinal canal or neural foraminal stenosis. MRI LUMBAR SPINE FINDINGS Segmentation: Standard; the lowest formed disc space is designated L5-S1. Alignment:  Normal. Vertebrae: Vertebral body heights are preserved. There is degenerative endplate marrow signal abnormality  at L5-S1. There is no suspicious marrow signal abnormality. Conus medullaris and cauda equina: Conus extends to the mid L1 level. Conus and cauda equina appear normal. Paraspinal and other soft tissues: There are innumerable cysts in the bilateral atrophic kidneys. A T2 hyperintense lesion in the right hepatic lobe likely reflects a cyst. The paraspinal soft tissues are unremarkable. Disc levels: There is disc  desiccation and narrowing at L5-S1. The disc spaces are otherwise overall preserved. There is relatively mild multilevel facet arthropathy throughout the lumbar spine. T12-L1: There is a minimal disc bulge without significant spinal canal or neural foraminal stenosis. L1-L2: There is a minimal disc bulge without significant spinal canal or neural foraminal stenosis. L2-L3: There is a mild disc bulge with bilateral foraminal components and mild degenerative endplate change and bilateral facet arthropathy resulting in mild left worse than right neural foraminal stenosis and mild subarticular zone narrowing without significant spinal canal stenosis. L3-L4: There is a diffuse disc bulge with bilateral foraminal components and degenerative endplate change and bilateral facet arthropathy resulting in mild-to-moderate spinal canal stenosis with crowding of the subarticular zones and moderate bilateral neural foraminal stenosis. L4-L5: There is a diffuse disc bulge with a left foraminal component, degenerative endplate change, and mild bilateral facet arthropathy resulting in mild crowding of the subarticular zones without significant spinal canal stenosis and moderate left and mild right neural foraminal stenosis. L5-S1: There is a disc bulge eccentric to the right with a right foraminal component, degenerative endplate change, and bilateral facet arthropathy resulting in mild crowding of the subarticular zones, right worse than left, without evidence of nerve root impingement, and moderate to severe right and mild left neural foraminal stenosis. IMPRESSION: CERVICAL SPINE MRI: 1. Advanced degenerative change in the cervical spine as detailed above resulting in severe spinal canal stenosis with cord compression from C3-C4 through C5-C6. There is possible cord signal abnormality in the left aspect of the cord at C3-C4 which may reflect compressive myelopathy or myelomalacia. 2. Severe bilateral neural foraminal stenosis at  C3-C4 through C5-C6 and moderate to severe left neural foraminal stenosis at C6-C7. 3. The right vertebral artery flow void is not definitely seen which could reflect chronic occlusion. This could be further evaluated with CTA or MRA of the neck as indicated. THORACIC SPINE MRI: 1. Mild multilevel degenerative changes. No high-grade spinal canal or neural foraminal stenosis. 2. Moderate bilateral pleural effusions. LUMBAR SPINE MRI: 1. Mild-to-moderate spinal canal stenosis at L3-L4 with crowding of the subarticular zones but no definite evidence of nerve root impingement. There is more mild subarticular zone narrowing at L2-L3, L4-L5, and L5-S1 also without evidence of nerve root impingement. 2. Moderate bilateral neural foraminal stenosis at L3-L4, moderate left neural foraminal stenosis at L4-L5, and moderate to severe right neural foraminal stenosis at L5-S1. 3. Relatively mild facet arthropathy throughout the lumbar spine. Electronically Signed   By: Valetta Mole M.D.   On: 02/28/2021 11:43    EKG: Independently reviewed.  Sinus rhythm, LVH, chronic nonspecific ST-T changes on chest leads  Assessment/Plan Active Problems:   * No active hospital problems. *  (please populate well all problems here in Problem List. (For example, if patient is on BP meds at home and you resume or decide to hold them, it is a problem that needs to be her. 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  However given significant cardiovascular history, patient has been considered to be a poor candidate for any spinal surgery candidate.  Long term prognosis poor.  Discussed with patient and his ex-wife at bedside. -Neurosurgery to decide use of Decadron in this case with primary onset of the symptoms more than 4 weeks. -Likely will  need rehab, PT evaluation.  Consult case management for home care. -Right now, there is no symptoms or signs of breathing compromised.  Check chest x-ray.  HTN, uncontrolled -Resume home BP meds amlodipine  ESRD on HD -Continue scheduled HD, next session Wednesday.  CAD -Stable, continue Plavix and statin.  Chronic combined systolic and diastolic CHF -Euvolemic, control hypertension as mentioned above.  Scheduled dialysis.  Chronic anemia secondary to CKD -On EPO treatment  DVT prophylaxis: Heparin subcu Code Status: Full code Family Communication: Ex-wife at bedside Disposition Plan: Expected more than 2 midnight hospital stay, expect discharge to SNF versus rehab. Consults called: Neurosurgery Admission status: Telemetry admission   Lequita Halt MD [facility] Pager [phone]  02/28/2021, 12:30 PM

## 2021-02-28 NOTE — ED Notes (Signed)
Called 3 w to have purple man activated

## 2021-03-01 DIAGNOSIS — G9520 Unspecified cord compression: Secondary | ICD-10-CM | POA: Diagnosis not present

## 2021-03-01 LAB — BASIC METABOLIC PANEL
Anion gap: 14 (ref 5–15)
BUN: 46 mg/dL — ABNORMAL HIGH (ref 8–23)
CO2: 26 mmol/L (ref 22–32)
Calcium: 9.4 mg/dL (ref 8.9–10.3)
Chloride: 95 mmol/L — ABNORMAL LOW (ref 98–111)
Creatinine, Ser: 8.45 mg/dL — ABNORMAL HIGH (ref 0.61–1.24)
GFR, Estimated: 6 mL/min — ABNORMAL LOW (ref >60)
Glucose, Bld: 96 mg/dL (ref 70–99)
Potassium: 4.4 mmol/L (ref 3.5–5.1)
Sodium: 135 mmol/L (ref 135–145)

## 2021-03-01 LAB — RESP PANEL BY RT-PCR (FLU A&B, COVID) ARPGX2
Influenza A by PCR: NEGATIVE
Influenza B by PCR: NEGATIVE
SARS Coronavirus 2 by RT PCR: NEGATIVE

## 2021-03-01 LAB — HEPATITIS B SURFACE ANTIBODY,QUALITATIVE: Hep B S Ab: REACTIVE — AB

## 2021-03-01 LAB — HEPATITIS B SURFACE ANTIGEN: Hepatitis B Surface Ag: NONREACTIVE

## 2021-03-01 MED ORDER — SODIUM CHLORIDE 0.9 % IV SOLN: 100.0000 mL | INTRAVENOUS | Status: DC | PRN

## 2021-03-01 MED ORDER — HEPARIN SODIUM (PORCINE) 1000 UNIT/ML DIALYSIS
1000.0000 [IU] | INTRAMUSCULAR | Status: DC | PRN
  Filled 2021-03-01: qty 1

## 2021-03-01 MED ORDER — ALTEPLASE 2 MG IJ SOLR
2.0000 mg | Freq: Once | INTRAMUSCULAR | Status: DC | PRN
  Filled 2021-03-01: qty 2

## 2021-03-01 MED ORDER — LIDOCAINE HCL (PF) 1 % IJ SOLN: 5.0000 mL | INTRAMUSCULAR | Status: DC | PRN

## 2021-03-01 MED ORDER — PROSOURCE PLUS PO LIQD
30.0000 mL | Freq: Three times a day (TID) | ORAL | Status: DC
  Administered 2021-03-02 – 2021-03-23 (×21): 30 mL via ORAL
  Filled 2021-03-01 (×35): qty 30

## 2021-03-01 MED ORDER — LIDOCAINE-PRILOCAINE 2.5-2.5 % EX CREA
1.0000 "application " | TOPICAL_CREAM | CUTANEOUS | Status: DC | PRN
  Filled 2021-03-01: qty 5

## 2021-03-01 MED ORDER — PENTAFLUOROPROP-TETRAFLUOROETH EX AERO: 1.0000 "application " | INHALATION_SPRAY | CUTANEOUS | Status: DC | PRN

## 2021-03-01 MED ORDER — CHLORHEXIDINE GLUCONATE CLOTH 2 % EX PADS
6.0000 | MEDICATED_PAD | Freq: Every day | CUTANEOUS | Status: DC
  Administered 2021-03-01 – 2021-03-15 (×14): 6 via TOPICAL

## 2021-03-01 NOTE — Evaluation (Signed)
Physical Therapy Evaluation Patient Details Name: Jose Garrett. MRN: [id number] DOB: 19-Aug-1950 Today's Date: 03/01/2021  History of Present Illness  70 y.o. m admitted on 02/27/21 due to increased weakness of BUE weakness and has become WC bound in the past 3 weeks due to weakness. MRI shows subacute c-spine cord compression, Neurosurgery consulted, pt is poor candidate due to comorbidities. PMH significant for amyloidosis, chronic combined CHF, ESRD, CAD, peripheral vascular disease status post right BKA, severe AS status post TAVR.   Clinical Impression  Pt admitted with above diagnosis. At baseline, pt lived alone.  He was able to ambulate short distances until the last 3 weeks only transferring to w/c.  Pt found to have severe cervical stenosis and poor candidate for surgery - poor motor recovery outcomes per MD notes and "would likely need LTC." Today, pt pleasant and able to participate with PT evaluation.  He demonstrates significantly decreased bil UE strength/coordination (difficulty gripping walker, utensils, BKA sleeve) as well as decreased trunk and LE strength.  Pt requiring mod A - mod x 2 for OOB activity and with recent falls.  Pt with poor prognosis for long term recovery per notes.  Will benefit from PT to see what gains can be made with transfer ability and safety as pt was moving better up until last 3 weeks.  Pt will likely need SNF at d/c with possible transfer to long term care due to poor long term prognosis.  If pt were to d/c home at d/c - would need to progress to min A level transfers with full 24/7 physical care. Pt currently with functional limitations due to the deficits listed below (see PT Problem List). Pt will benefit from skilled PT to increase their independence and safety with mobility to allow discharge to the venue listed below.          Recommendations for follow up therapy are one component of a multi-disciplinary discharge planning process, led by the  attending physician.  Recommendations may be updated based on patient status, additional functional criteria and insurance authorization.  Follow Up Recommendations Skilled nursing-short term rehab (<3 hours/day) (with likely transition to LTC)    Assistance Recommended at Discharge Frequent or constant Supervision/Assistance  Functional Status Assessment Patient has had a recent decline in their functional status and/or demonstrates limited ability to make significant improvements in function in a reasonable and predictable amount of time  Equipment Recommendations  None recommended by PT    Recommendations for Other Services       Precautions / Restrictions Precautions Precautions: Fall Restrictions Weight Bearing Restrictions: No      Mobility  Bed Mobility Overal bed mobility: Needs Assistance Bed Mobility: Supine to Sit     Supine to sit: Min assist     General bed mobility comments: Min A to push up to sitting    Transfers Overall transfer level: Needs assistance Equipment used: Rolling walker (2 wheels);2 person hand held assist Transfers: Sit to/from Stand;Bed to chair/wheelchair/BSC Sit to Stand: Mod assist;+2 physical assistance;+2 safety/equipment;From elevated surface          Lateral/Scoot Transfers: Mod assist;+2 safety/equipment General transfer comment: Pt stood x2 with RW and +2 to power up, however was unable to maintain stand due to weakness. Lat scoot, pt able to complete with mod A, having difficulty moving his feet when transferring    Ambulation/Gait               General Gait Details: unable  Stairs  Wheelchair Mobility    Modified Rankin (Stroke Patients Only)       Balance Overall balance assessment: Needs assistance Sitting-balance support: Feet supported;Bilateral upper extremity supported Sitting balance-Leahy Scale: Poor Sitting balance - Comments: Pt requiring intermittent mod-max A support especially  with dynamic sitting; Static sitting with close supervision; required assist to don prosthesis in sitting Postural control: Posterior lean Standing balance support: Bilateral upper extremity supported;Reliant on assistive device for balance Standing balance-Leahy Scale: Zero Standing balance comment: Pt unable to maintain standing balance at this time due to weakness.                             Pertinent Vitals/Pain Pain Assessment: Faces Faces Pain Scale: Hurts even more Pain Location: Low Back Pain Descriptors / Indicators: Aching;Discomfort;Grimacing Pain Intervention(s): Monitored during session;Repositioned;Premedicated before session (denied need for meds)    Home Living Family/patient expects to be discharged to:: Private residence Living Arrangements: Alone Available Help at Discharge: Family;Available PRN/intermittently Type of Home: Apartment Home Access: Stairs to enter   Entrance Stairs-Number of Steps: 4   Home Layout: One level Home Equipment: Conservation officer, nature (2 wheels);Wheelchair - manual;Tub bench;BSC/3in1      Prior Function Prior Level of Function : Needs assist       Physical Assist : ADLs (physical)     Mobility Comments: Pt was ambulatory until last 3 weeks with RW (limited ambulation), last few weeks mostly pivoting or scooting to w/c ADLs Comments: Had assist     Hand Dominance   Dominant Hand: Left    Extremity/Trunk Assessment   Upper Extremity Assessment Upper Extremity Assessment: Defer to OT evaluation RUE Deficits / Details: Poor coordination, weakness, and tingly/diminished feeling RUE Sensation: decreased light touch;decreased proprioception RUE Coordination: decreased fine motor;decreased gross motor LUE Deficits / Details: Poor coordination, weakness, and tingly/diminished feeling LUE Sensation: decreased light touch;decreased proprioception LUE Coordination: decreased fine motor;decreased gross motor    Lower  Extremity Assessment Lower Extremity Assessment: LLE deficits/detail;RLE deficits/detail RLE Deficits / Details: Hx BKA; ROM: knee ext lacking ~15 degrees, hip WFL; MMT: knee ext 3/5, hip 2/5 LLE Deficits / Details: ROM WFL; MMT: ankle 4/5, knee 4/5, hip 2/5    Cervical / Trunk Assessment Cervical / Trunk Assessment: Kyphotic  Communication   Communication: No difficulties  Cognition Arousal/Alertness: Awake/alert Behavior During Therapy: WFL for tasks assessed/performed Overall Cognitive Status: Within Functional Limits for tasks assessed                                          General Comments General comments (skin integrity, edema, etc.): VSS on RA    Exercises     Assessment/Plan    PT Assessment Patient needs continued PT services  PT Problem List Decreased strength;Decreased mobility;Decreased range of motion;Decreased coordination;Decreased activity tolerance;Decreased balance;Decreased knowledge of use of DME       PT Treatment Interventions Therapeutic activities;DME instruction;Gait training;Therapeutic exercise;Patient/family education;Balance training;Functional mobility training;Wheelchair mobility training    PT Goals (Current goals can be found in the Care Plan section)  Acute Rehab PT Goals Patient Stated Goal: agreeable to SNF PT Goal Formulation: With patient Time For Goal Achievement: 03/14/21 Potential to Achieve Goals: Fair    Frequency Min 2X/week   Barriers to discharge Decreased caregiver support      Co-evaluation PT/OT/SLP Co-Evaluation/Treatment: Yes Reason for Co-Treatment: For patient/therapist  safety PT goals addressed during session: Mobility/safety with mobility         AM-PAC PT "6 Clicks" Mobility  Outcome Measure Help needed turning from your back to your side while in a flat bed without using bedrails?: A Little Help needed moving from lying on your back to sitting on the side of a flat bed without using  bedrails?: A Little Help needed moving to and from a bed to a chair (including a wheelchair)?: A Lot Help needed standing up from a chair using your arms (e.g., wheelchair or bedside chair)?: Total Help needed to walk in hospital room?: Total Help needed climbing 3-5 steps with a railing? : Total 6 Click Score: 11    End of Session Equipment Utilized During Treatment: Gait belt Activity Tolerance: Patient tolerated treatment well Patient left: with chair alarm set;in chair;with call bell/phone within reach Nurse Communication: Mobility status;Need for lift equipment (REcommend STEDY) PT Visit Diagnosis: Other abnormalities of gait and mobility (R26.89);Muscle weakness (generalized) (M62.81)    Time: 9977-4142 PT Time Calculation (min) (ACUTE ONLY): 29 min   Charges:   PT Evaluation $PT Eval Moderate Complexity: 1 Melina Schools, PT Acute Rehab Services Pager [phone] [facility] 03/01/2021, 1:53 PM

## 2021-03-01 NOTE — Final Progress Note (Signed)
Pt received back on unit from dialysis. Pt reoriented to unit.

## 2021-03-01 NOTE — Progress Notes (Signed)
PROGRESS NOTE  Jose Garrett. HAL:[id number] DOB: [birth year] DOA: 02/27/2021 PCP: [name], NP   LOS: 1 day   Brief Narrative / Interim history: 70 year old male with amyloidosis, chronic combined CHF, ESRD, CAD, peripheral vascular disease status post right BKA, severe AS status post TAVR comes to the hospital with increased weakness of bilateral arms, hands, fingers as well as wheelchair-bound for the past 3 weeks.  Patient has been unable to lift his arms above the shoulder which is completely new.  Imaging in the ED showed subacute spinal cord compression.  Neurosurgery consulted  Subjective / 24h Interval events: Remains weak this morning.  Denies any chest pain, denies any shortness of breath.  No palpitations.  Assessment & Plan: Principal Problem Subacute C-spine cord compression-MRI on admission showed advanced degenerative changes resulting in severe spinal canal stenosis with cord compression from C3-4 through C5-6.  Neurosurgery consulted and following.  Neurosurgery will discuss with the patient regarding benefits/risks of surgery, however he is high risk  All his medical problems below appear stable at this time  Active Problems Chronic combined CHF-most recent 2D echo in August 2021 showed an EF of 45-50%, mild decreased LV function with global hypokinesis.  There was also grade 1 diastolic dysfunction.  RV was mildly reduced as well.  Currently appears euvolemic, volume management by HD  History of severe aortic stenosis-status post TAVR by Dr. Burt Knack in 2020.  Peripheral vascular disease-seen by vascular surgery as an outpatient, status post BKA on the right.  He has a femoropopliteal bypass graft on the left  Coronary artery disease-most recent cath was done in May 2020 which showed severe single-vessel CAD with chronic total occlusion of the RCA, with collaterals.  Mild calcific nonobstructive LCA disease.  Continue Plavix and statin  HTN, uncontrolled -Resume home  BP meds amlodipine   ESRD on HD -Continue scheduled HD per nephrology    Chronic anemia secondary to ESRD-On EPO treatment  Scheduled Meds:  amiodarone  200 mg Oral Daily   amLODipine  10 mg Oral Daily   atorvastatin  80 mg Oral Q supper   Chlorhexidine Gluconate Cloth  6 each Topical Q0600   clopidogrel  75 mg Oral Q supper   heparin  5,000 Units Subcutaneous Q12H   lanthanum  2,000 mg Oral TID WC   pantoprazole  40 mg Oral Daily   vitamin B-12  1,000 mcg Oral QPM   Continuous Infusions:  sodium chloride     sodium chloride     PRN Meds:.sodium chloride, sodium chloride, acetaminophen, albuterol, alteplase, docusate sodium, heparin, hydrALAZINE, lidocaine (PF), lidocaine-prilocaine, LORazepam, ondansetron, oxyCODONE-acetaminophen, pentafluoroprop-tetrafluoroeth  Diet Orders (From admission, onward)     Start     Ordered   02/28/21 1250  Diet renal with fluid restriction Fluid restriction: 1200 mL Fluid; Room service appropriate? Yes; Fluid consistency: Thin  Diet effective now       Question Answer Comment  Fluid restriction: 1200 mL Fluid   Room service appropriate? Yes   Fluid consistency: Thin      02/28/21 1249            DVT prophylaxis: heparin injection 5,000 Units Start: 02/28/21 1300     Code Status: Full Code  Family Communication: no family at bedside   Status is: Inpatient  Remains inpatient appropriate because: pending surgical evaluation  Level of care: Telemetry Medical  Consultants:  Neurosurgery   Procedures:  none  Microbiology  none  Antimicrobials: none    Objective: Vitals:  02/28/21 2348 03/01/21 0338 03/01/21 0415 03/01/21 0844  BP: (!) 146/58 (!) 158/39 126/76 (!) 172/43  Pulse:  71  72  Resp:  17  18  Temp:  97.7 F (36.5 C)  98.6 F (37 C)  TempSrc:  Oral    SpO2:  98%  91%  Weight:      Height:       No intake or output data in the 24 hours ending 03/01/21 1123 Filed Weights   02/27/21 1828  Weight: 73 kg     Examination:  Constitutional: NAD Eyes: no scleral icterus ENMT: Mucous membranes are moist.  Neck: normal, supple Respiratory: clear to auscultation bilaterally, no wheezing, no crackles.  Cardiovascular: Regular rate and rhythm, no murmurs / rubs / gallops. No LE edema.  Abdomen: non distended, no tenderness. Bowel sounds positive.  Musculoskeletal: no clubbing / cyanosis.  Skin: no rashes Neurologic: CN 2-12 grossly intact. Strength 5/5 in all 4.   Data Reviewed: I have independently reviewed following labs and imaging studies   CBC: Recent Labs  Lab 02/27/21 1901  WBC 6.1  HGB 9.2*  HCT 28.2*  MCV 97.2  PLT 505*   Basic Metabolic Panel: Recent Labs  Lab 02/27/21 1901 03/01/21 0302  NA 137 135  K 3.9 4.4  CL 94* 95*  CO2 29 26  GLUCOSE 84 96  BUN 23 46*  CREATININE 5.12* 8.45*  CALCIUM 9.1 9.4  MG 2.1  --    Liver Function Tests: Recent Labs  Lab 02/27/21 1901  AST 29  ALT 18  ALKPHOS 67  BILITOT 1.3*  PROT 6.9  ALBUMIN 3.6   Coagulation Profile: No results for input(s): INR, PROTIME in the last 168 hours. HbA1C: No results for input(s): HGBA1C in the last 72 hours. CBG: Recent Labs  Lab 02/27/21 1901  GLUCAP 112*    Recent Results (from the past 240 hour(s))  Resp Panel by RT-PCR (Flu A&B, Covid) Nasopharyngeal Swab     Status: None   Collection Time: 03/01/21  8:07 AM   Specimen: Nasopharyngeal Swab; Nasopharyngeal(NP) swabs in vial transport medium  Result Value Ref Range Status   SARS Coronavirus 2 by RT PCR NEGATIVE NEGATIVE Final    Comment: (NOTE) SARS-CoV-2 target nucleic acids are NOT DETECTED.  The SARS-CoV-2 RNA is generally detectable in upper respiratory specimens during the acute phase of infection. The lowest concentration of SARS-CoV-2 viral copies this assay can detect is 138 copies/mL. A negative result does not preclude SARS-Cov-2 infection and should not be used as the sole basis for treatment or other patient  management decisions. A negative result may occur with  improper specimen collection/handling, submission of specimen other than nasopharyngeal swab, presence of viral mutation(s) within the areas targeted by this assay, and inadequate number of viral copies(<138 copies/mL). A negative result must be combined with clinical observations, patient history, and epidemiological information. The expected result is Negative.  Fact Sheet for Patients:  EntrepreneurPulse.com.au  Fact Sheet for Healthcare Providers:  IncredibleEmployment.be  This test is no t yet approved or cleared by the [place] FDA and  has been authorized for detection and/or diagnosis of SARS-CoV-2 by FDA under an Emergency Use Authorization (EUA). This EUA will remain  in effect (meaning this test can be used) for the duration of the COVID-19 declaration under Section 564(b)(1) of the Act, 21 U.S.C.section 360bbb-3(b)(1), unless the authorization is terminated  or revoked sooner.       Influenza A by PCR NEGATIVE NEGATIVE Final  Influenza B by PCR NEGATIVE NEGATIVE Final    Comment: (NOTE) The Xpert Xpress SARS-CoV-2/FLU/RSV plus assay is intended as an aid in the diagnosis of influenza from Nasopharyngeal swab specimens and should not be used as a sole basis for treatment. Nasal washings and aspirates are unacceptable for Xpert Xpress SARS-CoV-2/FLU/RSV testing.  Fact Sheet for Patients: EntrepreneurPulse.com.au  Fact Sheet for Healthcare Providers: IncredibleEmployment.be  This test is not yet approved or cleared by the [place] FDA and has been authorized for detection and/or diagnosis of SARS-CoV-2 by FDA under an Emergency Use Authorization (EUA). This EUA will remain in effect (meaning this test can be used) for the duration of the COVID-19 declaration under Section 564(b)(1) of the Act, 21 U.S.C. section 360bbb-3(b)(1),  unless the authorization is terminated or revoked.  Performed at [facility], [place] [address], [place], [place] [address]      Radiology Studies: DG Chest 1 View  Result Date: 02/28/2021 CLINICAL DATA:  Weakness EXAM: CHEST  1 VIEW COMPARISON:  Chest x-ray 11/04/2020 FINDINGS: Heart is enlarged. Mediastinum appears stable. Cardiac surgical changes. Pulmonary vascular congestion with no focal consolidation identified. No significant pleural effusion visualized. No pneumothorax. IMPRESSION: Cardiomegaly and pulmonary vascular congestion. Electronically Signed   By: Ofilia Neas M.D.   On: 02/28/2021 13:17     Marzetta Board, MD, PhD [facility]  Between 7 am - 7 pm I am available, please contact me via Amion (for emergencies) or Securechat (non urgent messages)  Between 7 pm - 7 am I am not available, please contact night coverage MD/APP via Amion

## 2021-03-01 NOTE — Plan of Care (Signed)
  Problem: Coping: Goal: Level of anxiety will decrease Outcome: Progressing   Problem: Elimination: Goal: Will not experience complications related to bowel motility Outcome: Progressing Goal: Will not experience complications related to urinary retention Outcome: Progressing   Problem: Pain Managment: Goal: General experience of comfort will improve Outcome: Progressing   

## 2021-03-01 NOTE — Evaluation (Signed)
Occupational Therapy Evaluation Patient Details Name: Jose Garrett. MRN: [id number] DOB: 09-27-1950 Today's Date: 03/01/2021   History of Present Illness 70 y.o. m admitted on 02/27/21 due to increased weakness of BUE weakness and has become WC bound in the past 3 weeks due to weakness. MRI shows subacute c-spine cord compression, which he is not a surgical candidate for. PMH significant for amyloidosis, chronic combined CHF, ESRD, CAD, peripheral vascular disease status post right BKA, severe AS status post TAVR.   Clinical Impression   Pt admitted for concerns listed above. PTA pt reported that he has recently had a significant decline in function. He has mainly been wc bound and having difficulty completing ADL's due to weakness.  At this time, pt is requiring increased assist for all transfers and ADL's, due to weakness, BUE incoordination, decreased activity tolerance, and poor balance. Recommending SNF level therapies for pt at this time to assist with maximize independence and reducing caregiver burden. OT will follow acutely to address concerns listed below.      Recommendations for follow up therapy are one component of a multi-disciplinary discharge planning process, led by the attending physician.  Recommendations may be updated based on patient status, additional functional criteria and insurance authorization.   Follow Up Recommendations  Skilled nursing-short term rehab (<3 hours/day)    Assistance Recommended at Discharge Frequent or constant Supervision/Assistance  Functional Status Assessment  Patient has had a recent decline in their functional status and demonstrates the ability to make significant improvements in function in a reasonable and predictable amount of time.  Equipment Recommendations  None recommended by OT    Recommendations for Other Services       Precautions / Restrictions Precautions Precautions: Fall Restrictions Weight Bearing Restrictions:  No      Mobility Bed Mobility Overal bed mobility: Needs Assistance Bed Mobility: Supine to Sit     Supine to sit: Min assist     General bed mobility comments: Min A to push up to sitting    Transfers Overall transfer level: Needs assistance Equipment used: Rolling walker (2 wheels);2 person hand held assist Transfers: Sit to/from Stand;Bed to chair/wheelchair/BSC Sit to Stand: Mod assist;+2 physical assistance;+2 safety/equipment;From elevated surface          Lateral/Scoot Transfers: Mod assist General transfer comment: Pt stood x2 with RW and +2 to power up, however was unable to maintain stand due to weakness. Lat scoot, pt able to complete with mod A, having difficulty moving his feet when transferring      Balance Overall balance assessment: Needs assistance Sitting-balance support: Feet supported;Bilateral upper extremity supported Sitting balance-Leahy Scale: Poor Sitting balance - Comments: Pt requiring intermittent mod-max A support especially with dynamic sitting. Postural control: Posterior lean Standing balance support: Bilateral upper extremity supported;Reliant on assistive device for balance Standing balance-Leahy Scale: Zero Standing balance comment: Pt unable to maintain standing balance at this time due to weakness.                           ADL either performed or assessed with clinical judgement   ADL Overall ADL's : Needs assistance/impaired Eating/Feeding: Minimal assistance;With adaptive utensils   Grooming: Minimal assistance;With adaptive equipment;Sitting   Upper Body Bathing: Moderate assistance;Sitting   Lower Body Bathing: Maximal assistance;Sitting/lateral leans;Sit to/from stand;+2 for safety/equipment   Upper Body Dressing : Moderate assistance;Sitting   Lower Body Dressing: Maximal assistance;Sitting/lateral leans;Sit to/from stand;+2 for safety/equipment   Toilet Transfer: Moderate  assistance;Squat-pivot    Toileting- Water quality scientist and Hygiene: Maximal assistance;Sitting/lateral lean;Sit to/from stand       Functional mobility during ADLs: Moderate assistance General ADL Comments: Pt needing increased assist due to poor coordination and weakness in BUE.     Vision Baseline Vision/History: 1 Wears glasses Ability to See in Adequate Light: 0 Adequate Patient Visual Report: No change from baseline Vision Assessment?: No apparent visual deficits     Perception     Praxis      Pertinent Vitals/Pain Pain Assessment: Faces Faces Pain Scale: Hurts even more Pain Location: Low Back Pain Descriptors / Indicators: Aching;Discomfort;Grimacing Pain Intervention(s): Monitored during session;Repositioned     Hand Dominance Left   Extremity/Trunk Assessment Upper Extremity Assessment Upper Extremity Assessment: LUE deficits/detail;RUE deficits/detail RUE Deficits / Details: Poor coordination, weakness, and tingly/diminished feeling RUE Sensation: decreased light touch;decreased proprioception RUE Coordination: decreased fine motor;decreased gross motor LUE Deficits / Details: Poor coordination, weakness, and tingly/diminished feeling LUE Sensation: decreased light touch;decreased proprioception LUE Coordination: decreased fine motor;decreased gross motor   Lower Extremity Assessment Lower Extremity Assessment: Defer to PT evaluation   Cervical / Trunk Assessment Cervical / Trunk Assessment: Kyphotic   Communication Communication Communication: No difficulties   Cognition Arousal/Alertness: Lethargic Behavior During Therapy: WFL for tasks assessed/performed Overall Cognitive Status: Within Functional Limits for tasks assessed                                       General Comments  VSS on RA    Exercises     Shoulder Instructions      Home Living Family/patient expects to be discharged to:: Private residence Living Arrangements: Alone Available Help  at Discharge: Family;Available PRN/intermittently Type of Home: Apartment Home Access: Stairs to enter Entrance Stairs-Number of Steps: 4   Home Layout: One level     Bathroom Shower/Tub: Teacher, early years/pre: Standard     Home Equipment: Conservation officer, nature (2 wheels);Wheelchair - manual;Tub bench;BSC/3in1          Prior Functioning/Environment Prior Level of Function : Needs assist             Mobility Comments: Pt was ambulatory until last 3 weeks with RW (limited ambulation), last few weeks mostly pivoting or scooting to w/c ADLs Comments: Had assist        OT Problem List: Decreased strength;Decreased range of motion      OT Treatment/Interventions: Self-care/ADL training;Therapeutic exercise;Energy conservation;DME and/or AE instruction;Therapeutic activities;Patient/family education;Balance training    OT Goals(Current goals can be found in the care plan section) Acute Rehab OT Goals Patient Stated Goal: to relieve back pain OT Goal Formulation: With patient Time For Goal Achievement: 03/15/21 Potential to Achieve Goals: Fair ADL Goals Pt Will Perform Grooming: with supervision;sitting Pt Will Perform Lower Body Bathing: with min assist;with adaptive equipment;sitting/lateral leans;sit to/from stand Pt Will Perform Lower Body Dressing: with min assist;sitting/lateral leans;sit to/from stand;with adaptive equipment Pt Will Transfer to Toilet: with min assist;with transfer board Pt Will Perform Toileting - Clothing Manipulation and hygiene: with min assist;sitting/lateral leans;sit to/from stand;with adaptive equipment  OT Frequency: Min 2X/week   Barriers to D/C:            Co-evaluation              AM-PAC OT "6 Clicks" Daily Activity     Outcome Measure Help from another person eating meals?: A Little  Help from another person taking care of personal grooming?: A Little Help from another person toileting, which includes using toliet,  bedpan, or urinal?: A Lot Help from another person bathing (including washing, rinsing, drying)?: A Lot Help from another person to put on and taking off regular upper body clothing?: A Little Help from another person to put on and taking off regular lower body clothing?: A Lot 6 Click Score: 15   End of Session Equipment Utilized During Treatment: Gait belt;Rolling walker (2 wheels) Nurse Communication: Mobility status  Activity Tolerance: Patient tolerated treatment well Patient left: in chair;with call bell/phone within reach;with chair alarm set  OT Visit Diagnosis: Unsteadiness on feet (R26.81);Other abnormalities of gait and mobility (R26.89);Muscle weakness (generalized) (M62.81)                Time: 6389-3734 OT Time Calculation (min): 29 min Charges:  OT General Charges $OT Visit: 1 Visit OT Evaluation $OT Eval Moderate Complexity: 1 Mod  Kharson Rasmusson H., OTR/L Acute Rehabilitation  Kaenan Jake Elane Yolanda Bonine 03/01/2021, 1:37 PM

## 2021-03-01 NOTE — Progress Notes (Signed)
[place] [facility] Manufacturing engineer [facility]) Hospital Liaison note:  This is a pending outpatient-based Palliative Care patient. Will continue to follow for disposition.  Please call with any outpatient palliative questions or concerns.  Thank you, Lorelee Market, LPN [facility] Liaison [phone]

## 2021-03-01 NOTE — Plan of Care (Signed)

## 2021-03-01 NOTE — Progress Notes (Signed)
°  NEUROSURGERY PROGRESS NOTE   No issues overnight. Pt without new complaint this am.  EXAM:  BP (!) 176/63    Pulse 70    Temp 98.2 F (36.8 C)    Resp 13    Ht 5\' 10"  (1.778 m)    Wt 74.9 kg    SpO2 96%    BMI 23.69 kg/m   Awake, alert, oriented  Speech fluent, appropriate  CN grossly intact  Unchanged diffuse 4/5 weakness BUE/BLE  IMPRESSION:  70 y.o. male  with multiple major medical comorbidities and several weeks of progressive generalized weakness likely related in large part to severe cervical stenosis from C3-C6.  Obviously, the patient would be very high risk for perioperative complication.  It would be difficult to predict his clinical course without surgery, with possibility of stabilization of his symptoms or continued decline both possible.  PLAN: -   Patient would like some more time to discuss with his family prior to deciding whether he would like to proceed with ACDF at C3-4 C4-5 C5-6.    I did review the situation again with the patient.  I told him that without surgery, it is difficult to predict whether he will stop getting worse or not.  I did tell him that the likelihood of improving is minimal without surgery.  Unfortunately, with his multiple medical comorbidities I did tell him that the risk of perioperative serious complication or even death is quite high.  I did answer all his questions this morning.  He would like some time to discuss with his family prior to deciding on whether to proceed with surgery or not.   Consuella Lose, MD [facility]

## 2021-03-01 NOTE — Consult Note (Addendum)
[facility] Renal Consultation Note    Indication for Consultation:  Management of ESRD/hemodialysis; anemia, hypertension/volume and secondary hyperparathyroidism  EXB:MWUXLK, Maudie Mercury, NP  HPI: Jose Garrett. is a 70 y.o. male with ESRD on HD MWF at [facility]. His past medical history significant for amyloidosis, chronic combined CHF, ESRD, CAD, peripheral vascular disease status post right BKA, and severe AS s/p TAVR. Consulted for ESRD and HD management.  Patient presents to ED c/o worsening weakness of BL arms, hands, and fingers. Recent imaging shows spinal cord compression and Neurosurgery has been consulted. Last HD was on 02/27/21 where he only received 3 hr treatment. Current lab work stable and CXR shows vascular congestion. HD today per his usual schedule. Seen and examined patient on HD unit. He reports pain within ABD and back. Denies SOB, CP, palpitations, and N/V.   Past Medical History:  Diagnosis Date   Amyloidosis ([place])    Anemia of chronic disease    Anxiety    CAD (coronary artery disease)    a. cardiac cath on 11/25/17 with occlusion of the RCA which could have been his event but the RCA could not be engaged. The RCA filled distally from left to right collaterals. There were no severe stenoses in the left coronary system. Medical therapy recommended, started on plavix.   Cancer [facility])    multiple myeloma   Chronic combined systolic and diastolic CHF (congestive heart failure) ([place])    a. EF dropped to 35-40% with grade 2 DD by echo 11/2017.   DJD (degenerative joint disease), lumbar    Dysrhythmia    afib   ESRD (end stage renal disease) (HCC)    ESRD- HEMO MWF- [address]   GERD (gastroesophageal reflux disease)    Heart murmur    Hyperlipidemia    Hypertension    Myocardial infarction (HCC)    Neuropathy    Peripheral vascular disease (HCC)    Pneumonia    PONV (postoperative nausea and vomiting)    Restless legs    S/P TAVR  (transcatheter aortic valve replacement) 10/21/2018   26 mm Edwards Sapien 3 Ultra transcatheter heart valve placed via percutaneous right transfemoral approach    Severe aortic stenosis    severe   Past Surgical History:  Procedure Laterality Date   ABDOMINAL AORTOGRAM W/LOWER EXTREMITY Bilateral 07/16/2018   Procedure: ABDOMINAL AORTOGRAM W/LOWER EXTREMITY;  Surgeon: Wellington Hampshire, MD;  Location: [place] [facility];  Service: Cardiovascular;  Laterality: Bilateral;   ABDOMINAL AORTOGRAM W/LOWER EXTREMITY N/A 04/09/2019   Procedure: ABDOMINAL AORTOGRAM W/LOWER EXTREMITY - Right Lower;  Surgeon: Waynetta Sandy, MD;  Location: [place] [facility];  Service: Cardiovascular;  Laterality: N/A;   AMPUTATION Left 08/19/2018   Procedure: AMPUTATION LEFT TOES SECOND AND THIRD;  Surgeon: Waynetta Sandy, MD;  Location: [place];  Service: Vascular;  Laterality: Left;   AMPUTATION Right 05/07/2019   Procedure: AMPUTATION BELOW KNEE;  Surgeon: Serafina Mitchell, MD;  Location: [facility];  Service: Vascular;  Laterality: Right;   AV FISTULA PLACEMENT Right 12/27/2015   Procedure: Right Arm ARTERIOVENOUS (AV) FISTULA CREATION;  Surgeon: Angelia Mould, MD;  Location: [place];  Service: Vascular;  Laterality: Right;   CARDIAC CATHETERIZATION     CARDIAC VALVE REPLACEMENT     CATARACT EXTRACTION W/ INTRAOCULAR LENS  IMPLANT, BILATERAL     COLONOSCOPY     ERCP W/ SPHICTEROTOMY     EYE SURGERY     FEMORAL-POPLITEAL BYPASS  GRAFT Left 07/22/2018   Procedure: left FEMORAL TO BELOW KNEE POPLITEAL ARTERY BYPASS GREAFT using 65m removable ring propaten gore graft;  Surgeon: CWaynetta Sandy MD;  [place] [facility]  Service: Vascular;  Laterality: Left;   FEMORAL-POPLITEAL BYPASS GRAFT Right 04/10/2019   Procedure: BYPASS GRAFT RIGHT FEMORAL-POPLITEAL ARTERY;  Surgeon: ERosetta Posner MD;  [place]  Service: Vascular;  Laterality: Right;   GANGLION CYST EXCISION Left 11/02/2020    Procedure: EXCISION OF LEFT WRIST DORAL VOLAR MASSES;  Surgeon: WCharlotte Crumb MD;  [place]  Service: Orthopedics;  Laterality: Left;  CPT 25073   I & D EXTREMITY Right 05/05/2019   Procedure: Revision of  RIGHT  FOOT Amputation;  Surgeon: CWaynetta Sandy MD;  [place]  Service: Vascular;  Laterality: Right;   INTRAOPERATIVE TRANSTHORACIC ECHOCARDIOGRAM N/A 10/21/2018   Procedure: Intraoperative Transthoracic Echocardiogram;  Surgeon: CSherren Mocha MD;  [place]  Service: Open Heart Surgery;  Laterality: N/A;   IR GENERIC HISTORICAL  01/27/2016   IR FLUORO GUIDE CV LINE RIGHT 01/27/2016 DArne Cleveland MD [facility]   IR GENERIC HISTORICAL  01/27/2016   IR [place] ACCESS RIGHT 01/27/2016 DArne Cleveland MD [facility]   LAPAROSCOPIC CHOLECYSTECTOMY  03/2011   LEFT HEART CATH AND CORONARY ANGIOGRAPHY N/A 11/25/2017   Procedure: LEFT HEART CATH AND CORONARY ANGIOGRAPHY;  Surgeon: JMartinique Peter M, MD;  [place];  Service: Cardiovascular;  Laterality: N/A;   LEFT HEART CATH AND CORONARY ANGIOGRAPHY N/A 07/24/2018   Procedure: LEFT HEART CATH AND CORONARY ANGIOGRAPHY;  Surgeon: CSherren Mocha MD;  [place];  Service: Cardiovascular;  Laterality: N/A;   RENAL BIOPSY, PERCUTANEOUS Right 09/08/2014   RIGHT/LEFT HEART CATH AND CORONARY ANGIOGRAPHY N/A 03/25/2018   Procedure: RIGHT/LEFT HEART CATH AND CORONARY ANGIOGRAPHY;  Surgeon: JMartinique Peter M, MD;  [place];  Service: Cardiovascular;  Laterality: N/A;   TEE WITHOUT CARDIOVERSION N/A 05/14/2019   Procedure: TRANSESOPHAGEAL ECHOCARDIOGRAM (TEE);  Surgeon: RFay Records MD;  [facility]  Service: Cardiovascular;  Laterality: N/A;   TOE AMPUTATION Left 08/19/2018   2nd & 3rd toes   TONSILLECTOMY  ~ 1960   TRANSCATHETER AORTIC VALVE REPLACEMENT, TRANSFEMORAL N/A 10/21/2018   Procedure: TRANSCATHETER AORTIC VALVE  REPLACEMENT, TRANSFEMORAL;  Surgeon: CSherren Mocha MD;  [place]  Service: Open Heart Surgery;  Laterality: N/A;   TRANSMETATARSAL AMPUTATION Right 04/29/2019   Procedure: RIGHT TRANSMETATARSAL AMPUTATION;  Surgeon: CWaynetta Sandy MD;  [place]  Service: Vascular;  Laterality: Right;   ULTRASOUND GUIDANCE FOR VASCULAR ACCESS  03/25/2018   Procedure: Ultrasound Guidance For Vascular Access;  Surgeon: JMartinique Peter M, MD;  [place];  Service: Cardiovascular;;   Family History  Problem Relation Age of Onset   Hypertension Mother    Diabetes Mother    Heart disease Mother    Skin cancer Mother    Hypertension Father    Diabetes Father    Diabetes Sister    Social History:  reports that he quit smoking about 9 years ago. His smoking use included cigarettes. He has a 80.00 pack-year smoking history. He has never used smokeless tobacco. He reports that he does not drink alcohol and does not use drugs. Allergies  Allergen Reactions   Levonorgestrel-Ethinyl Estrad Swelling   Prior to Admission medications   Medication Sig Start Date End Date Taking? Authorizing Provider  albuterol (VENTOLIN HFA) 108 (90  Base) MCG/ACT inhaler Inhale 1 puff into the lungs every 6 hours as needed Patient taking differently: Inhale 1-2 puffs into the lungs every 6 (six) hours as needed for wheezing or shortness of breath. 01/24/21  Yes   amiodarone (PACERONE) 200 MG tablet TAKE 1 TABLET (200 MG TOTAL) BY MOUTH DAILY. Patient taking differently: Take 200 mg by mouth daily. 09/07/20 09/07/21 Yes Burnell Blanks, MD  amLODipine (NORVASC) 10 MG tablet Take 1 tablet (10 mg total) by mouth at bedtime. 10/18/20  Yes Oswald Hillock, MD  atorvastatin (LIPITOR) 80 MG tablet TAKE 1 TABLET BY MOUTH DAILY WITH SUPPER Patient taking differently: Take 80 mg by mouth daily with supper. 04/28/20 04/28/21 Yes Burnell Blanks, MD  B Complex-C-Folic Acid (RENA-VITE RX) 1 MG  TABS Take 1 tablet by mouth once a day 10/19/20  Yes   clopidogrel (PLAVIX) 75 MG tablet TAKE 1 TABLET BY MOUTH DAILY WITH SUPPER Patient taking differently: Take 75 mg by mouth daily with supper. 04/28/20 04/28/21 Yes Burnell Blanks, MD  Darbepoetin Alfa (ARANESP) 100 MCG/0.5ML SOSY injection Inject 0.5 mLs (100 mcg total) into the vein every Friday with hemodialysis. 05/29/19  Yes Angiulli, Lavon Paganini, PA-C  docusate sodium (COLACE) 100 MG capsule Take 1 capsule (100 mg total) by mouth 2 (two) times daily. Patient taking differently: Take 100 mg by mouth daily as needed for moderate constipation. 05/27/19  Yes Angiulli, Lavon Paganini, PA-C  ethyl chloride spray Apply 1 spray to dialysis access prior to dialysis as directed. 01/18/21  Yes   lanthanum (FOSRENOL) 1000 MG chewable tablet Chew 2,000 mg by mouth 3 (three) times daily. 07/15/20  Yes [provider]  LORazepam (ATIVAN) 1 MG tablet Take 1 tablet (1 mg total) by mouth 2 (two) times daily as needed for anxiety. 11/10/20  Yes Pahwani, Einar Grad, MD  multivitamin (RENA-VIT) TABS tablet Take 1 tablet by mouth at bedtime. 08/12/18  Yes Angiulli, Lavon Paganini, PA-C  ondansetron (ZOFRAN) 4 MG tablet TAKE 1 TABLET BY MOUTH TWICE A DAY AS NEEDED Patient taking differently: Take 4 mg by mouth 2 (two) times daily as needed for nausea or vomiting. 06/28/20 06/28/21 Yes Wellington Hampshire, MD  oxyCODONE-acetaminophen (PERCOCET) 5-325 MG tablet Take 1 tablet by mouth every 4 (four) hours as needed for severe pain. 11/10/20 11/10/21 Yes Pahwani, Einar Grad, MD  pantoprazole (PROTONIX) 40 MG tablet Take 1 tablet (40 mg total) by mouth daily. 05/28/19  Yes Angiulli, Lavon Paganini, PA-C  vitamin B-12 (CYANOCOBALAMIN) 1000 MCG tablet Take 1 tablet (1,000 mcg total) by mouth every evening. 05/27/19  Yes Angiulli, Lavon Paganini, PA-C  acetaminophen (TYLENOL) 325 MG tablet Take 1-2 tablets (325-650 mg total) by mouth every 4 (four) hours as needed for mild pain (or temp >/= 101 F). 05/27/19    Angiulli, Lavon Paganini, PA-C   Current Facility-Administered Medications  Medication Dose Route Frequency Provider Last Rate Last Admin   0.9 %  sodium chloride infusion  100 mL Intravenous PRN Tobie Poet E, NP       0.9 %  sodium chloride infusion  100 mL Intravenous PRN Adelfa Koh, NP       acetaminophen (TYLENOL) tablet 325-650 mg  325-650 mg Oral Q4H PRN Wynetta Fines T, MD       albuterol (PROVENTIL) (2.5 MG/3ML) 0.083% nebulizer solution 2.5 mg  2.5 mg Inhalation Q6H PRN Wynetta Fines T, MD       alteplase (CATHFLO ACTIVASE) injection 2 mg  2 mg Intracatheter Once  PRN Adelfa Koh, NP       amiodarone (PACERONE) tablet 200 mg  200 mg Oral Daily Wynetta Fines T, MD   200 mg at 03/01/21 1044   amLODipine (NORVASC) tablet 10 mg  10 mg Oral Daily Wynetta Fines T, MD       atorvastatin (LIPITOR) tablet 80 mg  80 mg Oral Q supper Wynetta Fines T, MD   80 mg at 02/28/21 1702   Chlorhexidine Gluconate Cloth 2 % PADS 6 each  6 each Topical Q0600 Adelfa Koh, NP   6 each at 03/01/21 0850   clopidogrel (PLAVIX) tablet 75 mg  75 mg Oral Q supper Wynetta Fines T, MD   75 mg at 02/28/21 1703   docusate sodium (COLACE) capsule 100 mg  100 mg Oral Daily PRN Lequita Halt, MD       heparin injection 1,000 Units  1,000 Units Dialysis PRN Adelfa Koh, NP       heparin injection 5,000 Units  5,000 Units Subcutaneous Q12H Wynetta Fines T, MD   5,000 Units at 03/01/21 1047   hydrALAZINE (APRESOLINE) tablet 25 mg  25 mg Oral Q6H PRN Lequita Halt, MD       lanthanum [facility]) chewable tablet 2,000 mg  2,000 mg Oral TID WC Wynetta Fines T, MD   2,000 mg at 03/01/21 0848   lidocaine (PF) (XYLOCAINE) 1 % injection 5 mL  5 mL Intradermal PRN Adelfa Koh, NP       lidocaine-prilocaine (EMLA) cream 1 application  1 application Topical PRN Adelfa Koh, NP       LORazepam (ATIVAN) tablet 1 mg  1 mg Oral BID PRN Wynetta Fines T, MD   1 mg at 03/01/21 0221   ondansetron (ZOFRAN)  tablet 4 mg  4 mg Oral BID PRN Lequita Halt, MD       oxyCODONE-acetaminophen (PERCOCET/ROXICET) 5-325 MG per tablet 2 tablet  2 tablet Oral Q4H PRN Regan Lemming, MD   2 tablet at 02/28/21 0930   pantoprazole (PROTONIX) EC tablet 40 mg  40 mg Oral Daily Wynetta Fines T, MD   40 mg at 03/01/21 1044   pentafluoroprop-tetrafluoroeth (GEBAUERS) aerosol 1 application  1 application Topical PRN Adelfa Koh, NP       vitamin B-12 (CYANOCOBALAMIN) tablet 1,000 mcg  1,000 mcg Oral QPM Wynetta Fines T, MD   1,000 mcg at 02/28/21 1703   Labs: Basic Metabolic Panel: Recent Labs  Lab 02/27/21 1901 03/01/21 0302  NA 137 135  K 3.9 4.4  CL 94* 95*  CO2 29 26  GLUCOSE 84 96  BUN 23 46*  CREATININE 5.12* 8.45*  CALCIUM 9.1 9.4   Liver Function Tests: Recent Labs  Lab 02/27/21 1901  AST 29  ALT 18  ALKPHOS 67  BILITOT 1.3*  PROT 6.9  ALBUMIN 3.6   No results for input(s): LIPASE, AMYLASE in the last 168 hours. No results for input(s): AMMONIA in the last 168 hours. CBC: Recent Labs  Lab 02/27/21 1901  WBC 6.1  HGB 9.2*  HCT 28.2*  MCV 97.2  PLT 128*   Cardiac Enzymes: No results for input(s): CKTOTAL, CKMB, CKMBINDEX, TROPONINI in the last 168 hours. CBG: Recent Labs  Lab 02/27/21 1901  GLUCAP 112*   Iron Studies: No results for input(s): IRON, TIBC, TRANSFERRIN, FERRITIN in the last 72 hours. Studies/Results: DG Chest 1 View  Result Date: 02/28/2021 CLINICAL DATA:  Weakness EXAM: CHEST  1 VIEW  COMPARISON:  Chest x-ray 11/04/2020 FINDINGS: Heart is enlarged. Mediastinum appears stable. Cardiac surgical changes. Pulmonary vascular congestion with no focal consolidation identified. No significant pleural effusion visualized. No pneumothorax. IMPRESSION: Cardiomegaly and pulmonary vascular congestion. Electronically Signed   By: Ofilia Neas M.D.   On: 02/28/2021 13:17   CT HEAD WO CONTRAST (5MM)  Result Date: 02/27/2021 CLINICAL DATA:  Mental status change,  unknown cause. EXAM: CT HEAD WITHOUT CONTRAST CT CERVICAL SPINE WITHOUT CONTRAST TECHNIQUE: Multidetector CT imaging of the head and cervical spine was performed following the standard protocol without intravenous contrast. Multiplanar CT image reconstructions of the cervical spine were also generated. COMPARISON:  11/03/2020. FINDINGS: CT HEAD FINDINGS Brain: No acute intracranial hemorrhage, midline shift or mass effect. Diffuse atrophy is noted. Mild periventricular white matter hypodensities are seen bilaterally. There is no hydrocephalus. Vascular: Atherosclerotic calcification of the vertebral arteries and carotid siphons. No hyperdense vessel. Skull: Normal. Negative for fracture or focal lesion. Sinuses/Orbits: No acute finding. Other: None. CT CERVICAL SPINE FINDINGS Alignment: There is mild retrolisthesis at C5-C6 and mild kyphosis of the mid cervical spine. Skull base and vertebrae: No acute fracture. No primary bone lesion or focal pathologic process. Soft tissues and spinal canal: No prevertebral fluid or swelling. No visible canal hematoma. Disc levels: Multilevel intervertebral disc space narrowing, osteophyte formation, and facet arthropathy is noted at multiple levels. There are disc osteophyte complexes at C3-C4, C4-C5, and C5-C6 resulting in moderate to severe spinal canal and neural foraminal stenosis. Upper chest: There are moderate layering bilateral pleural effusions with hazy airspace opacities in the lungs. Other: Atherosclerotic calcification of the carotid arteries. IMPRESSION: 1. No acute intracranial process. 2. Atrophy with chronic microvascular ischemic changes. 3. No acute fracture or subluxation in the cervical spine. 4. Multilevel disc herniations, disc osteophyte complexes and facet arthropathy resulting in moderate to severe spinal canal and neural foraminal stenosis from C3-C6. MRI and/or surgical consultation is suggested for follow-up. Electronically Signed   By: Brett Fairy  M.D.   On: 02/27/2021 21:18   CT Cervical Spine Wo Contrast  Result Date: 02/27/2021 CLINICAL DATA:  Mental status change, unknown cause. EXAM: CT HEAD WITHOUT CONTRAST CT CERVICAL SPINE WITHOUT CONTRAST TECHNIQUE: Multidetector CT imaging of the head and cervical spine was performed following the standard protocol without intravenous contrast. Multiplanar CT image reconstructions of the cervical spine were also generated. COMPARISON:  11/03/2020. FINDINGS: CT HEAD FINDINGS Brain: No acute intracranial hemorrhage, midline shift or mass effect. Diffuse atrophy is noted. Mild periventricular white matter hypodensities are seen bilaterally. There is no hydrocephalus. Vascular: Atherosclerotic calcification of the vertebral arteries and carotid siphons. No hyperdense vessel. Skull: Normal. Negative for fracture or focal lesion. Sinuses/Orbits: No acute finding. Other: None. CT CERVICAL SPINE FINDINGS Alignment: There is mild retrolisthesis at C5-C6 and mild kyphosis of the mid cervical spine. Skull base and vertebrae: No acute fracture. No primary bone lesion or focal pathologic process. Soft tissues and spinal canal: No prevertebral fluid or swelling. No visible canal hematoma. Disc levels: Multilevel intervertebral disc space narrowing, osteophyte formation, and facet arthropathy is noted at multiple levels. There are disc osteophyte complexes at C3-C4, C4-C5, and C5-C6 resulting in moderate to severe spinal canal and neural foraminal stenosis. Upper chest: There are moderate layering bilateral pleural effusions with hazy airspace opacities in the lungs. Other: Atherosclerotic calcification of the carotid arteries. IMPRESSION: 1. No acute intracranial process. 2. Atrophy with chronic microvascular ischemic changes. 3. No acute fracture or subluxation in the cervical spine. 4.  Multilevel disc herniations, disc osteophyte complexes and facet arthropathy resulting in moderate to severe spinal canal and neural  foraminal stenosis from C3-C6. MRI and/or surgical consultation is suggested for follow-up. Electronically Signed   By: Brett Fairy M.D.   On: 02/27/2021 21:18   CT Lumbar Spine Wo Contrast  Result Date: 02/27/2021 CLINICAL DATA:  Initial evaluation for low back pain, weakness. EXAM: CT LUMBAR SPINE WITHOUT CONTRAST TECHNIQUE: Multidetector CT imaging of the lumbar spine was performed without intravenous contrast administration. Multiplanar CT image reconstructions were also generated. COMPARISON:  Prior MRI from 02/25/2015. FINDINGS: Segmentation: Standard. Lowest well-formed disc space labeled the L5-S1 level. Alignment: Physiologic with preservation of the normal lumbar lordosis. No listhesis. Vertebrae: Vertebral body height maintained without acute or chronic fracture. Visualized sacrum and pelvis intact. SI joints symmetric and normal. No discrete or worrisome osseous lesions. Paraspinal and other soft tissues: Paraspinous soft tissues demonstrate no acute finding. Extensive arteriosclerosis seen throughout the visualized abdomen and pelvis. Visualized native kidneys are atrophic, compatible with history of end-stage renal disease. Approximate 1.5 cm benign appearing cyst noted at the left kidney. Bilateral pleural effusions partially visualized. Disc levels: L1-2: Minimal annular disc bulge with mild facet hypertrophy. No spinal stenosis. Foramina remain patent. L2-3: Mild annular disc bulge with bilateral facet hypertrophy. Resultant mild narrowing of the lateral recesses bilaterally. Central canal remains patent. Foramina remain patent. L3-4: Mild diffuse disc bulge with associated biforaminal disc protrusions. Mild to moderate right greater than left facet hypertrophy. Resultant mild canal with moderate bilateral lateral recess stenosis. Moderate bilateral L3 foraminal stenosis. L4-5: Diffuse disc bulge, slightly asymmetric to the left. Mild to moderate bilateral facet hypertrophy. Resultant  moderate narrowing of the lateral recesses bilaterally. Central canal remains patent. Moderate left with mild to moderate right L4 foraminal stenosis. L5-S1: Advanced degenerative intervertebral disc space narrowing with disc desiccation. Associated reactive endplate spurring. Broad-based posterior disc bulge contacts both of the descending S1 nerve roots as they course through the lateral recesses. No frank neural impingement or displacement. Moderate facet hypertrophy. No significant spinal stenosis. Mild to moderate right worse than left L5 foraminal narrowing. IMPRESSION: 1. No acute abnormality within the lumbar spine. 2. Multifactorial degenerative changes at L3-4 and L4-5 with resultant moderate bilateral lateral recess stenosis. 3. Moderate bilateral L3, L4, and L5 foraminal narrowing related to disc bulge, reactive endplate spurring, and facet hypertrophy. 4. Bilateral pleural effusions, partially visualized. 5.  Aortic Atherosclerosis (ICD10-I70.0). Electronically Signed   By: Jeannine Boga M.D.   On: 02/27/2021 21:35   MR Cervical Spine Wo Contrast  Result Date: 02/28/2021 CLINICAL DATA:  Weakness EXAM: MRI CERVICAL, THORACIC AND LUMBAR SPINE WITHOUT CONTRAST TECHNIQUE: Multiplanar and multiecho pulse sequences of the cervical spine, to include the craniocervical junction and cervicothoracic junction, and thoracic and lumbar spine, were obtained without intravenous contrast. COMPARISON:  Cervical and lumbar spine CT 02/27/2021, CTA chest 09/30/2018, lumbar spine MRI 02/25/2015 FINDINGS: MRI CERVICAL SPINE FINDINGS Alignment: There is slight reversal of the normal cervical spine lordosis centered at C4. There is trace retrolisthesis of C5 on C6. Alignment is otherwise normal. Vertebrae: Vertebral body heights are preserved. Heterogeneous marrow signal at C3 through C7 is likely degenerative in nature. There is no suspicious marrow signal abnormality. Cord: The cord is compressed from C3-C4  through C5-C6. There is suspected signal abnormality in the left aspect of the cord at C3-C4 (4-14). No other definite cord signal abnormality is seen. Posterior Fossa, vertebral arteries, paraspinal tissues: The imaged posterior fossa is unremarkable.  The paraspinal soft tissues are unremarkable. The left vertebral artery flow void is present. The right vertebral artery flow void is not definitely seen. Disc levels: There is marked disc space narrowing at C3-C4 through C6-C7. C2-C3: There is mild uncovertebral and bilateral facet arthropathy resulting in mild spinal canal stenosis without significant neural foraminal stenosis C3-C4: There is a posterior disc osteophyte complex with a prominent broad-based protrusion, and uncovertebral and bilateral facet arthropathy resulting in severe spinal canal stenosis with cord compression and possible cord signal abnormality as above, and severe bilateral neural foraminal stenosis. C4-C5: There is a posterior disc osteophyte complex with a broad-based protrusion and uncovertebral and bilateral facet arthropathy resulting in severe spinal canal stenosis with cord compression and severe bilateral neural foraminal stenosis. C5-C6: There is a posterior disc osteophyte complex with a left paracentral protrusion and uncovertebral and facet arthropathy resulting in severe spinal canal stenosis with cord compression and severe bilateral neural foraminal stenosis. C6-C7: There is a mild posterior disc osteophyte complex and uncovertebral and bilateral facet arthropathy resulting in mild spinal canal stenosis and moderate to severe left and mild right neural foraminal stenosis C7-T1: No significant spinal canal or neural foraminal stenosis. MRI THORACIC SPINE FINDINGS Alignment:  Normal. Vertebrae: Vertebral body heights are preserved. There is mild degenerative endplate marrow signal abnormality at T10-T11 with associated prominent Schmorl's nodes indenting the adjacent endplates. A  few smaller Schmorl's nodes are also noted in the upper thoracic spine. There is no suspicious marrow signal abnormality. Cord:  Normal in signal and morphology. Paraspinal and other soft tissues: There are moderate size bilateral pleural effusions. Paraspinal soft tissues are unremarkable. Disc levels: There is multilevel disc space narrowing and mild degenerative endplate change. There is no significant spinal canal or neural foraminal stenosis. MRI LUMBAR SPINE FINDINGS Segmentation: Standard; the lowest formed disc space is designated L5-S1. Alignment:  Normal. Vertebrae: Vertebral body heights are preserved. There is degenerative endplate marrow signal abnormality at L5-S1. There is no suspicious marrow signal abnormality. Conus medullaris and cauda equina: Conus extends to the mid L1 level. Conus and cauda equina appear normal. Paraspinal and other soft tissues: There are innumerable cysts in the bilateral atrophic kidneys. A T2 hyperintense lesion in the right hepatic lobe likely reflects a cyst. The paraspinal soft tissues are unremarkable. Disc levels: There is disc desiccation and narrowing at L5-S1. The disc spaces are otherwise overall preserved. There is relatively mild multilevel facet arthropathy throughout the lumbar spine. T12-L1: There is a minimal disc bulge without significant spinal canal or neural foraminal stenosis. L1-L2: There is a minimal disc bulge without significant spinal canal or neural foraminal stenosis. L2-L3: There is a mild disc bulge with bilateral foraminal components and mild degenerative endplate change and bilateral facet arthropathy resulting in mild left worse than right neural foraminal stenosis and mild subarticular zone narrowing without significant spinal canal stenosis. L3-L4: There is a diffuse disc bulge with bilateral foraminal components and degenerative endplate change and bilateral facet arthropathy resulting in mild-to-moderate spinal canal stenosis with  crowding of the subarticular zones and moderate bilateral neural foraminal stenosis. L4-L5: There is a diffuse disc bulge with a left foraminal component, degenerative endplate change, and mild bilateral facet arthropathy resulting in mild crowding of the subarticular zones without significant spinal canal stenosis and moderate left and mild right neural foraminal stenosis. L5-S1: There is a disc bulge eccentric to the right with a right foraminal component, degenerative endplate change, and bilateral facet arthropathy resulting in mild crowding of the  subarticular zones, right worse than left, without evidence of nerve root impingement, and moderate to severe right and mild left neural foraminal stenosis. IMPRESSION: CERVICAL SPINE MRI: 1. Advanced degenerative change in the cervical spine as detailed above resulting in severe spinal canal stenosis with cord compression from C3-C4 through C5-C6. There is possible cord signal abnormality in the left aspect of the cord at C3-C4 which may reflect compressive myelopathy or myelomalacia. 2. Severe bilateral neural foraminal stenosis at C3-C4 through C5-C6 and moderate to severe left neural foraminal stenosis at C6-C7. 3. The right vertebral artery flow void is not definitely seen which could reflect chronic occlusion. This could be further evaluated with CTA or MRA of the neck as indicated. THORACIC SPINE MRI: 1. Mild multilevel degenerative changes. No high-grade spinal canal or neural foraminal stenosis. 2. Moderate bilateral pleural effusions. LUMBAR SPINE MRI: 1. Mild-to-moderate spinal canal stenosis at L3-L4 with crowding of the subarticular zones but no definite evidence of nerve root impingement. There is more mild subarticular zone narrowing at L2-L3, L4-L5, and L5-S1 also without evidence of nerve root impingement. 2. Moderate bilateral neural foraminal stenosis at L3-L4, moderate left neural foraminal stenosis at L4-L5, and moderate to severe right neural  foraminal stenosis at L5-S1. 3. Relatively mild facet arthropathy throughout the lumbar spine. Electronically Signed   By: Valetta Mole M.D.   On: 02/28/2021 11:43   MR THORACIC SPINE WO CONTRAST  Result Date: 02/28/2021 CLINICAL DATA:  Weakness EXAM: MRI CERVICAL, THORACIC AND LUMBAR SPINE WITHOUT CONTRAST TECHNIQUE: Multiplanar and multiecho pulse sequences of the cervical spine, to include the craniocervical junction and cervicothoracic junction, and thoracic and lumbar spine, were obtained without intravenous contrast. COMPARISON:  Cervical and lumbar spine CT 02/27/2021, CTA chest 09/30/2018, lumbar spine MRI 02/25/2015 FINDINGS: MRI CERVICAL SPINE FINDINGS Alignment: There is slight reversal of the normal cervical spine lordosis centered at C4. There is trace retrolisthesis of C5 on C6. Alignment is otherwise normal. Vertebrae: Vertebral body heights are preserved. Heterogeneous marrow signal at C3 through C7 is likely degenerative in nature. There is no suspicious marrow signal abnormality. Cord: The cord is compressed from C3-C4 through C5-C6. There is suspected signal abnormality in the left aspect of the cord at C3-C4 (4-14). No other definite cord signal abnormality is seen. Posterior Fossa, vertebral arteries, paraspinal tissues: The imaged posterior fossa is unremarkable. The paraspinal soft tissues are unremarkable. The left vertebral artery flow void is present. The right vertebral artery flow void is not definitely seen. Disc levels: There is marked disc space narrowing at C3-C4 through C6-C7. C2-C3: There is mild uncovertebral and bilateral facet arthropathy resulting in mild spinal canal stenosis without significant neural foraminal stenosis C3-C4: There is a posterior disc osteophyte complex with a prominent broad-based protrusion, and uncovertebral and bilateral facet arthropathy resulting in severe spinal canal stenosis with cord compression and possible cord signal abnormality as above,  and severe bilateral neural foraminal stenosis. C4-C5: There is a posterior disc osteophyte complex with a broad-based protrusion and uncovertebral and bilateral facet arthropathy resulting in severe spinal canal stenosis with cord compression and severe bilateral neural foraminal stenosis. C5-C6: There is a posterior disc osteophyte complex with a left paracentral protrusion and uncovertebral and facet arthropathy resulting in severe spinal canal stenosis with cord compression and severe bilateral neural foraminal stenosis. C6-C7: There is a mild posterior disc osteophyte complex and uncovertebral and bilateral facet arthropathy resulting in mild spinal canal stenosis and moderate to severe left and mild right neural foraminal stenosis C7-T1:  No significant spinal canal or neural foraminal stenosis. MRI THORACIC SPINE FINDINGS Alignment:  Normal. Vertebrae: Vertebral body heights are preserved. There is mild degenerative endplate marrow signal abnormality at T10-T11 with associated prominent Schmorl's nodes indenting the adjacent endplates. A few smaller Schmorl's nodes are also noted in the upper thoracic spine. There is no suspicious marrow signal abnormality. Cord:  Normal in signal and morphology. Paraspinal and other soft tissues: There are moderate size bilateral pleural effusions. Paraspinal soft tissues are unremarkable. Disc levels: There is multilevel disc space narrowing and mild degenerative endplate change. There is no significant spinal canal or neural foraminal stenosis. MRI LUMBAR SPINE FINDINGS Segmentation: Standard; the lowest formed disc space is designated L5-S1. Alignment:  Normal. Vertebrae: Vertebral body heights are preserved. There is degenerative endplate marrow signal abnormality at L5-S1. There is no suspicious marrow signal abnormality. Conus medullaris and cauda equina: Conus extends to the mid L1 level. Conus and cauda equina appear normal. Paraspinal and other soft tissues: There  are innumerable cysts in the bilateral atrophic kidneys. A T2 hyperintense lesion in the right hepatic lobe likely reflects a cyst. The paraspinal soft tissues are unremarkable. Disc levels: There is disc desiccation and narrowing at L5-S1. The disc spaces are otherwise overall preserved. There is relatively mild multilevel facet arthropathy throughout the lumbar spine. T12-L1: There is a minimal disc bulge without significant spinal canal or neural foraminal stenosis. L1-L2: There is a minimal disc bulge without significant spinal canal or neural foraminal stenosis. L2-L3: There is a mild disc bulge with bilateral foraminal components and mild degenerative endplate change and bilateral facet arthropathy resulting in mild left worse than right neural foraminal stenosis and mild subarticular zone narrowing without significant spinal canal stenosis. L3-L4: There is a diffuse disc bulge with bilateral foraminal components and degenerative endplate change and bilateral facet arthropathy resulting in mild-to-moderate spinal canal stenosis with crowding of the subarticular zones and moderate bilateral neural foraminal stenosis. L4-L5: There is a diffuse disc bulge with a left foraminal component, degenerative endplate change, and mild bilateral facet arthropathy resulting in mild crowding of the subarticular zones without significant spinal canal stenosis and moderate left and mild right neural foraminal stenosis. L5-S1: There is a disc bulge eccentric to the right with a right foraminal component, degenerative endplate change, and bilateral facet arthropathy resulting in mild crowding of the subarticular zones, right worse than left, without evidence of nerve root impingement, and moderate to severe right and mild left neural foraminal stenosis. IMPRESSION: CERVICAL SPINE MRI: 1. Advanced degenerative change in the cervical spine as detailed above resulting in severe spinal canal stenosis with cord compression from C3-C4  through C5-C6. There is possible cord signal abnormality in the left aspect of the cord at C3-C4 which may reflect compressive myelopathy or myelomalacia. 2. Severe bilateral neural foraminal stenosis at C3-C4 through C5-C6 and moderate to severe left neural foraminal stenosis at C6-C7. 3. The right vertebral artery flow void is not definitely seen which could reflect chronic occlusion. This could be further evaluated with CTA or MRA of the neck as indicated. THORACIC SPINE MRI: 1. Mild multilevel degenerative changes. No high-grade spinal canal or neural foraminal stenosis. 2. Moderate bilateral pleural effusions. LUMBAR SPINE MRI: 1. Mild-to-moderate spinal canal stenosis at L3-L4 with crowding of the subarticular zones but no definite evidence of nerve root impingement. There is more mild subarticular zone narrowing at L2-L3, L4-L5, and L5-S1 also without evidence of nerve root impingement. 2. Moderate bilateral neural foraminal stenosis at L3-L4, moderate  left neural foraminal stenosis at L4-L5, and moderate to severe right neural foraminal stenosis at L5-S1. 3. Relatively mild facet arthropathy throughout the lumbar spine. Electronically Signed   By: Valetta Mole M.D.   On: 02/28/2021 11:43   MR LUMBAR SPINE WO CONTRAST  Result Date: 02/28/2021 CLINICAL DATA:  Weakness EXAM: MRI CERVICAL, THORACIC AND LUMBAR SPINE WITHOUT CONTRAST TECHNIQUE: Multiplanar and multiecho pulse sequences of the cervical spine, to include the craniocervical junction and cervicothoracic junction, and thoracic and lumbar spine, were obtained without intravenous contrast. COMPARISON:  Cervical and lumbar spine CT 02/27/2021, CTA chest 09/30/2018, lumbar spine MRI 02/25/2015 FINDINGS: MRI CERVICAL SPINE FINDINGS Alignment: There is slight reversal of the normal cervical spine lordosis centered at C4. There is trace retrolisthesis of C5 on C6. Alignment is otherwise normal. Vertebrae: Vertebral body heights are preserved.  Heterogeneous marrow signal at C3 through C7 is likely degenerative in nature. There is no suspicious marrow signal abnormality. Cord: The cord is compressed from C3-C4 through C5-C6. There is suspected signal abnormality in the left aspect of the cord at C3-C4 (4-14). No other definite cord signal abnormality is seen. Posterior Fossa, vertebral arteries, paraspinal tissues: The imaged posterior fossa is unremarkable. The paraspinal soft tissues are unremarkable. The left vertebral artery flow void is present. The right vertebral artery flow void is not definitely seen. Disc levels: There is marked disc space narrowing at C3-C4 through C6-C7. C2-C3: There is mild uncovertebral and bilateral facet arthropathy resulting in mild spinal canal stenosis without significant neural foraminal stenosis C3-C4: There is a posterior disc osteophyte complex with a prominent broad-based protrusion, and uncovertebral and bilateral facet arthropathy resulting in severe spinal canal stenosis with cord compression and possible cord signal abnormality as above, and severe bilateral neural foraminal stenosis. C4-C5: There is a posterior disc osteophyte complex with a broad-based protrusion and uncovertebral and bilateral facet arthropathy resulting in severe spinal canal stenosis with cord compression and severe bilateral neural foraminal stenosis. C5-C6: There is a posterior disc osteophyte complex with a left paracentral protrusion and uncovertebral and facet arthropathy resulting in severe spinal canal stenosis with cord compression and severe bilateral neural foraminal stenosis. C6-C7: There is a mild posterior disc osteophyte complex and uncovertebral and bilateral facet arthropathy resulting in mild spinal canal stenosis and moderate to severe left and mild right neural foraminal stenosis C7-T1: No significant spinal canal or neural foraminal stenosis. MRI THORACIC SPINE FINDINGS Alignment:  Normal. Vertebrae: Vertebral body  heights are preserved. There is mild degenerative endplate marrow signal abnormality at T10-T11 with associated prominent Schmorl's nodes indenting the adjacent endplates. A few smaller Schmorl's nodes are also noted in the upper thoracic spine. There is no suspicious marrow signal abnormality. Cord:  Normal in signal and morphology. Paraspinal and other soft tissues: There are moderate size bilateral pleural effusions. Paraspinal soft tissues are unremarkable. Disc levels: There is multilevel disc space narrowing and mild degenerative endplate change. There is no significant spinal canal or neural foraminal stenosis. MRI LUMBAR SPINE FINDINGS Segmentation: Standard; the lowest formed disc space is designated L5-S1. Alignment:  Normal. Vertebrae: Vertebral body heights are preserved. There is degenerative endplate marrow signal abnormality at L5-S1. There is no suspicious marrow signal abnormality. Conus medullaris and cauda equina: Conus extends to the mid L1 level. Conus and cauda equina appear normal. Paraspinal and other soft tissues: There are innumerable cysts in the bilateral atrophic kidneys. A T2 hyperintense lesion in the right hepatic lobe likely reflects a cyst. The paraspinal soft tissues are  unremarkable. Disc levels: There is disc desiccation and narrowing at L5-S1. The disc spaces are otherwise overall preserved. There is relatively mild multilevel facet arthropathy throughout the lumbar spine. T12-L1: There is a minimal disc bulge without significant spinal canal or neural foraminal stenosis. L1-L2: There is a minimal disc bulge without significant spinal canal or neural foraminal stenosis. L2-L3: There is a mild disc bulge with bilateral foraminal components and mild degenerative endplate change and bilateral facet arthropathy resulting in mild left worse than right neural foraminal stenosis and mild subarticular zone narrowing without significant spinal canal stenosis. L3-L4: There is a diffuse  disc bulge with bilateral foraminal components and degenerative endplate change and bilateral facet arthropathy resulting in mild-to-moderate spinal canal stenosis with crowding of the subarticular zones and moderate bilateral neural foraminal stenosis. L4-L5: There is a diffuse disc bulge with a left foraminal component, degenerative endplate change, and mild bilateral facet arthropathy resulting in mild crowding of the subarticular zones without significant spinal canal stenosis and moderate left and mild right neural foraminal stenosis. L5-S1: There is a disc bulge eccentric to the right with a right foraminal component, degenerative endplate change, and bilateral facet arthropathy resulting in mild crowding of the subarticular zones, right worse than left, without evidence of nerve root impingement, and moderate to severe right and mild left neural foraminal stenosis. IMPRESSION: CERVICAL SPINE MRI: 1. Advanced degenerative change in the cervical spine as detailed above resulting in severe spinal canal stenosis with cord compression from C3-C4 through C5-C6. There is possible cord signal abnormality in the left aspect of the cord at C3-C4 which may reflect compressive myelopathy or myelomalacia. 2. Severe bilateral neural foraminal stenosis at C3-C4 through C5-C6 and moderate to severe left neural foraminal stenosis at C6-C7. 3. The right vertebral artery flow void is not definitely seen which could reflect chronic occlusion. This could be further evaluated with CTA or MRA of the neck as indicated. THORACIC SPINE MRI: 1. Mild multilevel degenerative changes. No high-grade spinal canal or neural foraminal stenosis. 2. Moderate bilateral pleural effusions. LUMBAR SPINE MRI: 1. Mild-to-moderate spinal canal stenosis at L3-L4 with crowding of the subarticular zones but no definite evidence of nerve root impingement. There is more mild subarticular zone narrowing at L2-L3, L4-L5, and L5-S1 also without evidence of  nerve root impingement. 2. Moderate bilateral neural foraminal stenosis at L3-L4, moderate left neural foraminal stenosis at L4-L5, and moderate to severe right neural foraminal stenosis at L5-S1. 3. Relatively mild facet arthropathy throughout the lumbar spine. Electronically Signed   By: Valetta Mole M.D.   On: 02/28/2021 11:43    Physical Exam: Vitals:   03/01/21 1500 03/01/21 1530 03/01/21 1600 03/01/21 1630  BP: (!) 151/54 138/73 (!) 145/52 (!) 162/52  Pulse: 67 67 69 69  Resp:      Temp:      TempSrc:      SpO2:      Weight:      Height:         General: WDWN NAD Lungs: CTA bilaterally. No wheeze, rales or rhonchi. Breathing is unlabored. Heart: RRR. No murmur, rubs or gallops.  Abdomen: soft, nontender, +BS, no guarding, no rebound tenderness Lower extremities: no edema BLLE Neuro: AAOx3. Moves all extremities spontaneously. Dialysis Access: R AVF (+) Bruit/Thrill  Dialysis Orders:  MWF- [place] 4hrs, BFR 400, DFR Auto 1.5,  EDW 71.9kg, 2K/ 2Ca Heparin 2400 unit bolus Mircera 75 mcg q4wks - last 02/15/21 Calcitriol 27mg PO qHD-last 02/27/21 Home meds:  Last Labs: Hgb 9.2, K 4.4, Ca 9.4, Alb 3.6  Assessment/Plan: Spinal Cord Compression-imaging shows advanced degenerative changes and spinal canal stenosis with cord compression. Neurosurgery consulted. Continue supportive care and pain management. ESRD - On HD per usual schedule. CXR shows vascular congestion. Appears euvolemic on exam and not in resp distress. Tolerated UF today with net UF 1.7L. Next HD 03/03/21. Hypertension/volume-Bps varaible; monitor trend; continue Amlodipine and Hydralazine Anemia of CKD - Hgb 9.2; receives monthly ESA outpatient-monitor trends here, may need to resume Secondary Hyperparathyroidism - Ca ok, will order PO4 in AM. Continue binders Nutrition - Renal diet with fluid restriction; Albumin slightly low, will add protein supplements  Tobie Poet, NP [facility] 03/01/2021, 4:42 PM    I have seen and examined this patient and agree with plan and assessment in the above note with renal recommendations/intervention highlighted.  Pt was asking to come off of dialysis early due to pain but able to be redirected.  Neurosurgery evaluated and he is deciding upon proceeding with surgery after discussing with his family.  Jose John A Jermy Couper,MD 03/01/2021 5:30 PM

## 2021-03-01 NOTE — Procedures (Signed)
I was present at this dialysis session. I have reviewed the session itself and made appropriate changes.   Vital signs in last 24 hours:  Temp:  [97.7 F (36.5 C)-98.6 F (37 C)] 98.2 F (36.8 C) (12/21 1309) Pulse Rate:  [47-78] 67 (12/21 1500) Resp:  [13-20] 14 (12/21 1430) BP: (87-185)/(39-91) 151/54 (12/21 1500) SpO2:  [91 %-100 %] 99 % (12/21 1430) Weight:  [74.9 kg] 74.9 kg (12/21 1309) Weight change:  Filed Weights   02/27/21 1828 03/01/21 1309  Weight: 73 kg 74.9 kg    Recent Labs  Lab 03/01/21 0302  NA 135  K 4.4  CL 95*  CO2 26  GLUCOSE 96  BUN 46*  CREATININE 8.45*  CALCIUM 9.4    Recent Labs  Lab 02/27/21 1901  WBC 6.1  HGB 9.2*  HCT 28.2*  MCV 97.2  PLT 128*    Scheduled Meds:  amiodarone  200 mg Oral Daily   amLODipine  10 mg Oral Daily   atorvastatin  80 mg Oral Q supper   Chlorhexidine Gluconate Cloth  6 each Topical Q0600   clopidogrel  75 mg Oral Q supper   heparin  5,000 Units Subcutaneous Q12H   lanthanum  2,000 mg Oral TID WC   pantoprazole  40 mg Oral Daily   vitamin B-12  1,000 mcg Oral QPM   Continuous Infusions:  sodium chloride     sodium chloride     PRN Meds:.sodium chloride, sodium chloride, acetaminophen, albuterol, alteplase, docusate sodium, heparin, hydrALAZINE, lidocaine (PF), lidocaine-prilocaine, LORazepam, ondansetron, oxyCODONE-acetaminophen, pentafluoroprop-tetrafluoroeth   Donetta Potts,  MD 03/01/2021, 3:06 PM

## 2021-03-02 ENCOUNTER — Other Ambulatory Visit: Payer: Medicare Other | Admitting: Family Medicine

## 2021-03-02 DIAGNOSIS — G9520 Unspecified cord compression: Secondary | ICD-10-CM | POA: Diagnosis not present

## 2021-03-02 LAB — CBC WITH DIFFERENTIAL/PLATELET
Abs Immature Granulocytes: 0.01 10*3/uL (ref 0.00–0.07)
Basophils Absolute: 0 10*3/uL (ref 0.0–0.1)
Basophils Relative: 1 %
Eosinophils Absolute: 0.1 10*3/uL (ref 0.0–0.5)
Eosinophils Relative: 2 %
HCT: 26.6 % — ABNORMAL LOW (ref 39.0–52.0)
Hemoglobin: 8.4 g/dL — ABNORMAL LOW (ref 13.0–17.0)
Immature Granulocytes: 0 %
Lymphocytes Relative: 26 %
Lymphs Abs: 0.9 10*3/uL (ref 0.7–4.0)
MCH: 30.9 pg (ref 26.0–34.0)
MCHC: 31.6 g/dL (ref 30.0–36.0)
MCV: 97.8 fL (ref 80.0–100.0)
Monocytes Absolute: 0.4 10*3/uL (ref 0.1–1.0)
Monocytes Relative: 10 %
Neutro Abs: 2.2 10*3/uL (ref 1.7–7.7)
Neutrophils Relative %: 61 %
Platelets: 103 10*3/uL — ABNORMAL LOW (ref 150–400)
RBC: 2.72 MIL/uL — ABNORMAL LOW (ref 4.22–5.81)
RDW: 15.2 % (ref 11.5–15.5)
WBC: 3.6 10*3/uL — ABNORMAL LOW (ref 4.0–10.5)
nRBC: 0 % (ref 0.0–0.2)

## 2021-03-02 LAB — RENAL FUNCTION PANEL
Albumin: 3.1 g/dL — ABNORMAL LOW (ref 3.5–5.0)
Anion gap: 10 (ref 5–15)
BUN: 22 mg/dL (ref 8–23)
CO2: 27 mmol/L (ref 22–32)
Calcium: 8.7 mg/dL — ABNORMAL LOW (ref 8.9–10.3)
Chloride: 96 mmol/L — ABNORMAL LOW (ref 98–111)
Creatinine, Ser: 5.31 mg/dL — ABNORMAL HIGH (ref 0.61–1.24)
GFR, Estimated: 11 mL/min — ABNORMAL LOW (ref >60)
Glucose, Bld: 98 mg/dL (ref 70–99)
Phosphorus: 5.2 mg/dL — ABNORMAL HIGH (ref 2.5–4.6)
Potassium: 3.8 mmol/L (ref 3.5–5.1)
Sodium: 133 mmol/L — ABNORMAL LOW (ref 135–145)

## 2021-03-02 LAB — HEPATITIS B SURFACE ANTIBODY, QUANTITATIVE: Hep B S AB Quant (Post): 627.6 m[IU]/mL (ref >9.9)

## 2021-03-02 NOTE — Progress Notes (Signed)
°  Transition of Care [facility]) Screening Note   Patient Details  Name: Jose Garrett. Date of Birth: Sep 16, 1950   Transition of Care [id number]) CM/SW Contact:    Pollie Friar, RN Phone Number: 03/02/2021, 3:42 PM    Transition of Care Department [facility]) has reviewed patient. We will continue to monitor patient advancement through interdisciplinary progression rounds. If new patient transition needs arise, please place a TOC consult.

## 2021-03-02 NOTE — Progress Notes (Signed)
Occupational Therapy Treatment Patient Details Name: Jose Garrett. MRN: [id number] DOB: 11/11/1950 Today's Date: 03/02/2021   History of present illness 70 y.o. m admitted on 02/27/21 due to increased weakness of BUE weakness and has become WC bound in the past 3 weeks due to weakness. MRI shows subacute c-spine cord compression, which he is not a surgical candidate for. PMH significant for amyloidosis, chronic combined CHF, ESRD, CAD, peripheral vascular disease status post right BKA, severe AS status post TAVR.   OT comments  Pt making incremental progress. This session he worked on using built up handles, lidded cup with 2 handles, and a universal cup. With these adaptive equipment, pt is able to complete grooming and self feeding with less assist at this time. OT will continue to follow and assist pt with maintaining independence.    Recommendations for follow up therapy are one component of a multi-disciplinary discharge planning process, led by the attending physician.  Recommendations may be updated based on patient status, additional functional criteria and insurance authorization.    Follow Up Recommendations  Skilled nursing-short term rehab (<3 hours/day)    Assistance Recommended at Discharge Frequent or constant Supervision/Assistance  Equipment Recommendations  None recommended by OT    Recommendations for Other Services      Precautions / Restrictions Precautions Precautions: Fall Restrictions Weight Bearing Restrictions: No       Mobility Bed Mobility Overal bed mobility: Needs Assistance Bed Mobility: Supine to Sit     Supine to sit: Min assist     General bed mobility comments: Min A to push up to sitting    Transfers                   General transfer comment: Remained bed level     Balance Overall balance assessment: Needs assistance Sitting-balance support: Feet supported Sitting balance-Leahy Scale: Fair Sitting balance - Comments:  Pt sitting EOB for meal and grooming with no assist. Left sitting EOB.                                   ADL either performed or assessed with clinical judgement   ADL Overall ADL's : Needs assistance/impaired Eating/Feeding: Minimal assistance;With adaptive utensils Eating/Feeding Details (indicate cue type and reason): Provided pt with lidded cup with 2 handles, 2 built up handles for utensils, and a universal cup Grooming: Minimal assistance;With adaptive equipment;Sitting Grooming Details (indicate cue type and reason): provided build up handles for tooth brush                               General ADL Comments: Focused session on self feeding and grooming this session with adaptive equipment this session    Extremity/Trunk Assessment              Vision       Perception     Praxis      Cognition Arousal/Alertness: Awake/alert Behavior During Therapy: WFL for tasks assessed/performed Overall Cognitive Status: Within Functional Limits for tasks assessed                                            Exercises     Shoulder Instructions       General Comments VSS  on RA    Pertinent Vitals/ Pain       Pain Assessment: No/denies pain  Home Living                                          Prior Functioning/Environment              Frequency  Min 2X/week        Progress Toward Goals  OT Goals(current goals can now be found in the care plan section)  Progress towards OT goals: Progressing toward goals  Acute Rehab OT Goals Patient Stated Goal: None stated OT Goal Formulation: With patient Time For Goal Achievement: 03/15/21 Potential to Achieve Goals: Fair ADL Goals Pt Will Perform Grooming: with supervision;sitting Pt Will Perform Lower Body Bathing: with min assist;with adaptive equipment;sitting/lateral leans;sit to/from stand Pt Will Perform Lower Body Dressing: with min  assist;sitting/lateral leans;sit to/from stand;with adaptive equipment Pt Will Transfer to Toilet: with min assist;with transfer board Pt Will Perform Toileting - Clothing Manipulation and hygiene: with min assist;sitting/lateral leans;sit to/from stand;with adaptive equipment  Plan Discharge plan remains appropriate;Frequency remains appropriate    Co-evaluation                 AM-PAC OT "6 Clicks" Daily Activity     Outcome Measure   Help from another person eating meals?: A Little Help from another person taking care of personal grooming?: A Little Help from another person toileting, which includes using toliet, bedpan, or urinal?: A Lot Help from another person bathing (including washing, rinsing, drying)?: A Lot Help from another person to put on and taking off regular upper body clothing?: A Little Help from another person to put on and taking off regular lower body clothing?: A Lot 6 Click Score: 15    End of Session Equipment Utilized During Treatment: Other (comment) (Built up handles, lidded cup with handles, and universal cup)  OT Visit Diagnosis: Unsteadiness on feet (R26.81);Other abnormalities of gait and mobility (R26.89);Muscle weakness (generalized) (M62.81)   Activity Tolerance Patient tolerated treatment well   Patient Left in bed;with call bell/phone within reach;with family/visitor present   Nurse Communication Mobility status        Time: 2409-7353 OT Time Calculation (min): 18 min  Charges: OT General Charges $OT Visit: 1 Visit OT Treatments $Self Care/Home Management : 8-22 mins  Senovia Gauer H., OTR/L Acute Rehabilitation  Nixxon Faria Elane Wilmoth Rasnic 03/02/2021, 7:05 PM

## 2021-03-02 NOTE — Progress Notes (Signed)
PROGRESS NOTE  Jose Garrett. [id number]:[id number] DOB: 1950/11/24 DOA: 02/27/2021 PCP: [name], NP   LOS: 2 days   Brief Narrative / Interim history: 70 year old male with amyloidosis, chronic combined CHF, ESRD, CAD, peripheral vascular disease status post right BKA, severe AS status post TAVR comes to the hospital with increased weakness of bilateral arms, hands, fingers as well as wheelchair-bound for the past 3 weeks.  Patient has been unable to lift his arms above the shoulder which is completely new.  Imaging in the ED showed subacute spinal cord compression.  Neurosurgery consulted  Subjective / 24h Interval events: Remains weak. No new complaints. Not sure whether he wants to go ahead with surgery, needs to discuss with his family further  Assessment & Plan: Principal Problem Subacute C-spine cord compression-MRI on admission showed advanced degenerative changes resulting in severe spinal canal stenosis with cord compression from C3-4 through C5-6.  Neurosurgery consulted and following.  Neurosurgery will discuss with the patient regarding benefits/risks of surgery, however he is high risk  All his medical problems below remain stable  Active Problems Chronic combined CHF-most recent 2D echo in August 2021 showed an EF of 45-50%, mild decreased LV function with global hypokinesis.  There was also grade 1 diastolic dysfunction.  RV was mildly reduced as well.  Currently appears euvolemic. Underwent HD yesterday.   History of severe aortic stenosis-status post TAVR by Dr. Burt Knack in 2020.  Peripheral vascular disease-seen by vascular surgery as an outpatient, status post BKA on the right.  He has a femoropopliteal bypass graft on the left  Coronary artery disease-most recent cath was done in May 2020 which showed severe single-vessel CAD with chronic total occlusion of the RCA, with collaterals.  Mild calcific nonobstructive LCA disease.  Continue Plavix and statin. No chest pain    HTN, uncontrolled -Resume home BP meds amlodipine   ESRD on HD -Continue scheduled HD per nephrology    Chronic anemia secondary to ESRD-On EPO treatment  Scheduled Meds:  (feeding supplement) PROSource Plus  30 mL Oral TID BM   amiodarone  200 mg Oral Daily   amLODipine  10 mg Oral Daily   atorvastatin  80 mg Oral Q supper   Chlorhexidine Gluconate Cloth  6 each Topical Q0600   clopidogrel  75 mg Oral Q supper   heparin  5,000 Units Subcutaneous Q12H   lanthanum  2,000 mg Oral TID WC   pantoprazole  40 mg Oral Daily   vitamin B-12  1,000 mcg Oral QPM   Continuous Infusions:   PRN Meds:.acetaminophen, albuterol, docusate sodium, hydrALAZINE, LORazepam, ondansetron, oxyCODONE-acetaminophen  Diet Orders (From admission, onward)     Start     Ordered   02/28/21 1250  Diet renal with fluid restriction Fluid restriction: 1200 mL Fluid; Room service appropriate? Yes; Fluid consistency: Thin  Diet effective now       Question Answer Comment  Fluid restriction: 1200 mL Fluid   Room service appropriate? Yes   Fluid consistency: Thin      02/28/21 1249            DVT prophylaxis: heparin injection 5,000 Units Start: 02/28/21 1300     Code Status: Full Code  Family Communication: no family at bedside   Status is: Inpatient  Remains inpatient appropriate because: pending surgical evaluation  Level of care: Telemetry Medical  Consultants:  Neurosurgery   Procedures:  none  Microbiology  none  Antimicrobials: none    Objective: Vitals:   03/01/21  2347 03/02/21 0010 03/02/21 0308 03/02/21 0330  BP: (!) 155/47 (!) 148/78 (!) 165/46 (!) 144/66  Pulse: 70  69   Resp: 18  15   Temp: 98.2 F (36.8 C)  98.8 F (37.1 C)   TempSrc:      SpO2: (!) 88%  90%   Weight:      Height:        Intake/Output Summary (Last 24 hours) at 03/02/2021 0842 Last data filed at 03/01/2021 1636 Gross per 24 hour  Intake --  Output 1225 ml  Net -1225 ml   Filed Weights    02/27/21 1828 03/01/21 1309 03/01/21 1636  Weight: 73 kg 74.9 kg 73.7 kg    Examination:  Constitutional: nad Eyes: anicteric ENMT: mmm Neck: normal, supple Respiratory: cta biL, no wheezing, no crackles Cardiovascular: rrr, no mrg. No edema Abdomen: soft, nt, nd, bs+ Musculoskeletal: no clubbing / cyanosis.  Skin: no rashes Neurologic: no focal deficits. Global weakness  Data Reviewed: I have independently reviewed following labs and imaging studies   CBC: Recent Labs  Lab 02/27/21 1901 03/02/21 0223  WBC 6.1 3.6*  NEUTROABS  --  2.2  HGB 9.2* 8.4*  HCT 28.2* 26.6*  MCV 97.2 97.8  PLT 128* 103*    Basic Metabolic Panel: Recent Labs  Lab 02/27/21 1901 03/01/21 0302 03/02/21 0223  NA 137 135 133*  K 3.9 4.4 3.8  CL 94* 95* 96*  CO2 29 26 27   GLUCOSE 84 96 98  BUN 23 46* 22  CREATININE 5.12* 8.45* 5.31*  CALCIUM 9.1 9.4 8.7*  MG 2.1  --   --   PHOS  --   --  5.2*    Liver Function Tests: Recent Labs  Lab 02/27/21 1901 03/02/21 0223  AST 29  --   ALT 18  --   ALKPHOS 67  --   BILITOT 1.3*  --   PROT 6.9  --   ALBUMIN 3.6 3.1*    Coagulation Profile: No results for input(s): INR, PROTIME in the last 168 hours. HbA1C: No results for input(s): HGBA1C in the last 72 hours. CBG: Recent Labs  Lab 02/27/21 1901  GLUCAP 112*     Recent Results (from the past 240 hour(s))  Resp Panel by RT-PCR (Flu A&B, Covid) Nasopharyngeal Swab     Status: None   Collection Time: 03/01/21  8:07 AM   Specimen: Nasopharyngeal Swab; Nasopharyngeal(NP) swabs in vial transport medium  Result Value Ref Range Status   SARS Coronavirus 2 by RT PCR NEGATIVE NEGATIVE Final    Comment: (NOTE) SARS-CoV-2 target nucleic acids are NOT DETECTED.  The SARS-CoV-2 RNA is generally detectable in upper respiratory specimens during the acute phase of infection. The lowest concentration of SARS-CoV-2 viral copies this assay can detect is 138 copies/mL. A negative result does  not preclude SARS-Cov-2 infection and should not be used as the sole basis for treatment or other patient management decisions. A negative result may occur with  improper specimen collection/handling, submission of specimen other than nasopharyngeal swab, presence of viral mutation(s) within the areas targeted by this assay, and inadequate number of viral copies(<138 copies/mL). A negative result must be combined with clinical observations, patient history, and epidemiological information. The expected result is Negative.  Fact Sheet for Patients:  EntrepreneurPulse.com.au  Fact Sheet for Healthcare Providers:  IncredibleEmployment.be  This test is no t yet approved or cleared by the [place] FDA and  has been authorized for detection and/or diagnosis of SARS-CoV-2  by FDA under an Emergency Use Authorization (EUA). This EUA will remain  in effect (meaning this test can be used) for the duration of the COVID-19 declaration under Section 564(b)(1) of the Act, 21 U.S.C.section 360bbb-3(b)(1), unless the authorization is terminated  or revoked sooner.       Influenza A by PCR NEGATIVE NEGATIVE Final   Influenza B by PCR NEGATIVE NEGATIVE Final    Comment: (NOTE) The Xpert Xpress SARS-CoV-2/FLU/RSV plus assay is intended as an aid in the diagnosis of influenza from Nasopharyngeal swab specimens and should not be used as a sole basis for treatment. Nasal washings and aspirates are unacceptable for Xpert Xpress SARS-CoV-2/FLU/RSV testing.  Fact Sheet for Patients: EntrepreneurPulse.com.au  Fact Sheet for Healthcare Providers: IncredibleEmployment.be  This test is not yet approved or cleared by the [place] FDA and has been authorized for detection and/or diagnosis of SARS-CoV-2 by FDA under an Emergency Use Authorization (EUA). This EUA will remain in effect (meaning this test can be used) for the  duration of the COVID-19 declaration under Section 564(b)(1) of the Act, 21 U.S.C. section 360bbb-3(b)(1), unless the authorization is terminated or revoked.  Performed at [facility], [place] [address], [place], [place] [address]       Radiology Studies: No results found.   Marzetta Board, MD, PhD [facility]  Between 7 am - 7 pm I am available, please contact me via Amion (for emergencies) or Securechat (non urgent messages)  Between 7 pm - 7 am I am not available, please contact night coverage MD/APP via Amion

## 2021-03-02 NOTE — Progress Notes (Signed)
Patient ID: Osamu Olguin., male   DOB: 1950/07/17, 70 y.o.   MRN: [id number] S: No new complaints this morning.  Did not speak with family regarding surgery. O:BP (!) 168/62 (BP Location: Right Arm)    Pulse 68    Temp 98 F (36.7 C)    Resp 18    Ht 5\' 10"  (1.778 m)    Wt 73.7 kg    SpO2 93%    BMI 23.31 kg/m   Intake/Output Summary (Last 24 hours) at 03/02/2021 1018 Last data filed at 03/01/2021 1636 Gross per 24 hour  Intake --  Output 1225 ml  Net -1225 ml   Intake/Output: I/O last 3 completed shifts: In: -  Out: 1225 [Other:1225]  Intake/Output this shift:  No intake/output data recorded. Weight change:  Gen: NAD CVS: RRR Resp:CTA Abd: +BS, soft, NT/nd Ext: no edema, R AVF +T/B  Recent Labs  Lab 02/27/21 1901 03/01/21 0302 03/02/21 0223  NA 137 135 133*  K 3.9 4.4 3.8  CL 94* 95* 96*  CO2 29 26 27   GLUCOSE 84 96 98  BUN 23 46* 22  CREATININE 5.12* 8.45* 5.31*  ALBUMIN 3.6  --  3.1*  CALCIUM 9.1 9.4 8.7*  PHOS  --   --  5.2*  AST 29  --   --   ALT 18  --   --    Liver Function Tests: Recent Labs  Lab 02/27/21 1901 03/02/21 0223  AST 29  --   ALT 18  --   ALKPHOS 67  --   BILITOT 1.3*  --   PROT 6.9  --   ALBUMIN 3.6 3.1*   No results for input(s): LIPASE, AMYLASE in the last 168 hours. No results for input(s): AMMONIA in the last 168 hours. CBC: Recent Labs  Lab 02/27/21 1901 03/02/21 0223  WBC 6.1 3.6*  NEUTROABS  --  2.2  HGB 9.2* 8.4*  HCT 28.2* 26.6*  MCV 97.2 97.8  PLT 128* 103*   Cardiac Enzymes: No results for input(s): CKTOTAL, CKMB, CKMBINDEX, TROPONINI in the last 168 hours. CBG: Recent Labs  Lab 02/27/21 1901  GLUCAP 112*    Iron Studies: No results for input(s): IRON, TIBC, TRANSFERRIN, FERRITIN in the last 72 hours. Studies/Results: DG Chest 1 View  Result Date: 02/28/2021 CLINICAL DATA:  Weakness EXAM: CHEST  1 VIEW COMPARISON:  Chest x-ray 11/04/2020 FINDINGS: Heart is enlarged. Mediastinum appears stable.  Cardiac surgical changes. Pulmonary vascular congestion with no focal consolidation identified. No significant pleural effusion visualized. No pneumothorax. IMPRESSION: Cardiomegaly and pulmonary vascular congestion. Electronically Signed   By: Ofilia Neas M.D.   On: 02/28/2021 13:17   MR Cervical Spine Wo Contrast  Result Date: 02/28/2021 CLINICAL DATA:  Weakness EXAM: MRI CERVICAL, THORACIC AND LUMBAR SPINE WITHOUT CONTRAST TECHNIQUE: Multiplanar and multiecho pulse sequences of the cervical spine, to include the craniocervical junction and cervicothoracic junction, and thoracic and lumbar spine, were obtained without intravenous contrast. COMPARISON:  Cervical and lumbar spine CT 02/27/2021, CTA chest 09/30/2018, lumbar spine MRI 02/25/2015 FINDINGS: MRI CERVICAL SPINE FINDINGS Alignment: There is slight reversal of the normal cervical spine lordosis centered at C4. There is trace retrolisthesis of C5 on C6. Alignment is otherwise normal. Vertebrae: Vertebral body heights are preserved. Heterogeneous marrow signal at C3 through C7 is likely degenerative in nature. There is no suspicious marrow signal abnormality. Cord: The cord is compressed from C3-C4 through C5-C6. There is suspected signal abnormality in the left aspect of the  cord at C3-C4 (4-14). No other definite cord signal abnormality is seen. Posterior Fossa, vertebral arteries, paraspinal tissues: The imaged posterior fossa is unremarkable. The paraspinal soft tissues are unremarkable. The left vertebral artery flow void is present. The right vertebral artery flow void is not definitely seen. Disc levels: There is marked disc space narrowing at C3-C4 through C6-C7. C2-C3: There is mild uncovertebral and bilateral facet arthropathy resulting in mild spinal canal stenosis without significant neural foraminal stenosis C3-C4: There is a posterior disc osteophyte complex with a prominent broad-based protrusion, and uncovertebral and bilateral  facet arthropathy resulting in severe spinal canal stenosis with cord compression and possible cord signal abnormality as above, and severe bilateral neural foraminal stenosis. C4-C5: There is a posterior disc osteophyte complex with a broad-based protrusion and uncovertebral and bilateral facet arthropathy resulting in severe spinal canal stenosis with cord compression and severe bilateral neural foraminal stenosis. C5-C6: There is a posterior disc osteophyte complex with a left paracentral protrusion and uncovertebral and facet arthropathy resulting in severe spinal canal stenosis with cord compression and severe bilateral neural foraminal stenosis. C6-C7: There is a mild posterior disc osteophyte complex and uncovertebral and bilateral facet arthropathy resulting in mild spinal canal stenosis and moderate to severe left and mild right neural foraminal stenosis C7-T1: No significant spinal canal or neural foraminal stenosis. MRI THORACIC SPINE FINDINGS Alignment:  Normal. Vertebrae: Vertebral body heights are preserved. There is mild degenerative endplate marrow signal abnormality at T10-T11 with associated prominent Schmorl's nodes indenting the adjacent endplates. A few smaller Schmorl's nodes are also noted in the upper thoracic spine. There is no suspicious marrow signal abnormality. Cord:  Normal in signal and morphology. Paraspinal and other soft tissues: There are moderate size bilateral pleural effusions. Paraspinal soft tissues are unremarkable. Disc levels: There is multilevel disc space narrowing and mild degenerative endplate change. There is no significant spinal canal or neural foraminal stenosis. MRI LUMBAR SPINE FINDINGS Segmentation: Standard; the lowest formed disc space is designated L5-S1. Alignment:  Normal. Vertebrae: Vertebral body heights are preserved. There is degenerative endplate marrow signal abnormality at L5-S1. There is no suspicious marrow signal abnormality. Conus medullaris and  cauda equina: Conus extends to the mid L1 level. Conus and cauda equina appear normal. Paraspinal and other soft tissues: There are innumerable cysts in the bilateral atrophic kidneys. A T2 hyperintense lesion in the right hepatic lobe likely reflects a cyst. The paraspinal soft tissues are unremarkable. Disc levels: There is disc desiccation and narrowing at L5-S1. The disc spaces are otherwise overall preserved. There is relatively mild multilevel facet arthropathy throughout the lumbar spine. T12-L1: There is a minimal disc bulge without significant spinal canal or neural foraminal stenosis. L1-L2: There is a minimal disc bulge without significant spinal canal or neural foraminal stenosis. L2-L3: There is a mild disc bulge with bilateral foraminal components and mild degenerative endplate change and bilateral facet arthropathy resulting in mild left worse than right neural foraminal stenosis and mild subarticular zone narrowing without significant spinal canal stenosis. L3-L4: There is a diffuse disc bulge with bilateral foraminal components and degenerative endplate change and bilateral facet arthropathy resulting in mild-to-moderate spinal canal stenosis with crowding of the subarticular zones and moderate bilateral neural foraminal stenosis. L4-L5: There is a diffuse disc bulge with a left foraminal component, degenerative endplate change, and mild bilateral facet arthropathy resulting in mild crowding of the subarticular zones without significant spinal canal stenosis and moderate left and mild right neural foraminal stenosis. L5-S1: There is a  disc bulge eccentric to the right with a right foraminal component, degenerative endplate change, and bilateral facet arthropathy resulting in mild crowding of the subarticular zones, right worse than left, without evidence of nerve root impingement, and moderate to severe right and mild left neural foraminal stenosis. IMPRESSION: CERVICAL SPINE MRI: 1. Advanced  degenerative change in the cervical spine as detailed above resulting in severe spinal canal stenosis with cord compression from C3-C4 through C5-C6. There is possible cord signal abnormality in the left aspect of the cord at C3-C4 which may reflect compressive myelopathy or myelomalacia. 2. Severe bilateral neural foraminal stenosis at C3-C4 through C5-C6 and moderate to severe left neural foraminal stenosis at C6-C7. 3. The right vertebral artery flow void is not definitely seen which could reflect chronic occlusion. This could be further evaluated with CTA or MRA of the neck as indicated. THORACIC SPINE MRI: 1. Mild multilevel degenerative changes. No high-grade spinal canal or neural foraminal stenosis. 2. Moderate bilateral pleural effusions. LUMBAR SPINE MRI: 1. Mild-to-moderate spinal canal stenosis at L3-L4 with crowding of the subarticular zones but no definite evidence of nerve root impingement. There is more mild subarticular zone narrowing at L2-L3, L4-L5, and L5-S1 also without evidence of nerve root impingement. 2. Moderate bilateral neural foraminal stenosis at L3-L4, moderate left neural foraminal stenosis at L4-L5, and moderate to severe right neural foraminal stenosis at L5-S1. 3. Relatively mild facet arthropathy throughout the lumbar spine. Electronically Signed   By: Valetta Mole M.D.   On: 02/28/2021 11:43   MR THORACIC SPINE WO CONTRAST  Result Date: 02/28/2021 CLINICAL DATA:  Weakness EXAM: MRI CERVICAL, THORACIC AND LUMBAR SPINE WITHOUT CONTRAST TECHNIQUE: Multiplanar and multiecho pulse sequences of the cervical spine, to include the craniocervical junction and cervicothoracic junction, and thoracic and lumbar spine, were obtained without intravenous contrast. COMPARISON:  Cervical and lumbar spine CT 02/27/2021, CTA chest 09/30/2018, lumbar spine MRI 02/25/2015 FINDINGS: MRI CERVICAL SPINE FINDINGS Alignment: There is slight reversal of the normal cervical spine lordosis centered at  C4. There is trace retrolisthesis of C5 on C6. Alignment is otherwise normal. Vertebrae: Vertebral body heights are preserved. Heterogeneous marrow signal at C3 through C7 is likely degenerative in nature. There is no suspicious marrow signal abnormality. Cord: The cord is compressed from C3-C4 through C5-C6. There is suspected signal abnormality in the left aspect of the cord at C3-C4 (4-14). No other definite cord signal abnormality is seen. Posterior Fossa, vertebral arteries, paraspinal tissues: The imaged posterior fossa is unremarkable. The paraspinal soft tissues are unremarkable. The left vertebral artery flow void is present. The right vertebral artery flow void is not definitely seen. Disc levels: There is marked disc space narrowing at C3-C4 through C6-C7. C2-C3: There is mild uncovertebral and bilateral facet arthropathy resulting in mild spinal canal stenosis without significant neural foraminal stenosis C3-C4: There is a posterior disc osteophyte complex with a prominent broad-based protrusion, and uncovertebral and bilateral facet arthropathy resulting in severe spinal canal stenosis with cord compression and possible cord signal abnormality as above, and severe bilateral neural foraminal stenosis. C4-C5: There is a posterior disc osteophyte complex with a broad-based protrusion and uncovertebral and bilateral facet arthropathy resulting in severe spinal canal stenosis with cord compression and severe bilateral neural foraminal stenosis. C5-C6: There is a posterior disc osteophyte complex with a left paracentral protrusion and uncovertebral and facet arthropathy resulting in severe spinal canal stenosis with cord compression and severe bilateral neural foraminal stenosis. C6-C7: There is a mild posterior disc osteophyte complex  and uncovertebral and bilateral facet arthropathy resulting in mild spinal canal stenosis and moderate to severe left and mild right neural foraminal stenosis C7-T1: No  significant spinal canal or neural foraminal stenosis. MRI THORACIC SPINE FINDINGS Alignment:  Normal. Vertebrae: Vertebral body heights are preserved. There is mild degenerative endplate marrow signal abnormality at T10-T11 with associated prominent Schmorl's nodes indenting the adjacent endplates. A few smaller Schmorl's nodes are also noted in the upper thoracic spine. There is no suspicious marrow signal abnormality. Cord:  Normal in signal and morphology. Paraspinal and other soft tissues: There are moderate size bilateral pleural effusions. Paraspinal soft tissues are unremarkable. Disc levels: There is multilevel disc space narrowing and mild degenerative endplate change. There is no significant spinal canal or neural foraminal stenosis. MRI LUMBAR SPINE FINDINGS Segmentation: Standard; the lowest formed disc space is designated L5-S1. Alignment:  Normal. Vertebrae: Vertebral body heights are preserved. There is degenerative endplate marrow signal abnormality at L5-S1. There is no suspicious marrow signal abnormality. Conus medullaris and cauda equina: Conus extends to the mid L1 level. Conus and cauda equina appear normal. Paraspinal and other soft tissues: There are innumerable cysts in the bilateral atrophic kidneys. A T2 hyperintense lesion in the right hepatic lobe likely reflects a cyst. The paraspinal soft tissues are unremarkable. Disc levels: There is disc desiccation and narrowing at L5-S1. The disc spaces are otherwise overall preserved. There is relatively mild multilevel facet arthropathy throughout the lumbar spine. T12-L1: There is a minimal disc bulge without significant spinal canal or neural foraminal stenosis. L1-L2: There is a minimal disc bulge without significant spinal canal or neural foraminal stenosis. L2-L3: There is a mild disc bulge with bilateral foraminal components and mild degenerative endplate change and bilateral facet arthropathy resulting in mild left worse than right  neural foraminal stenosis and mild subarticular zone narrowing without significant spinal canal stenosis. L3-L4: There is a diffuse disc bulge with bilateral foraminal components and degenerative endplate change and bilateral facet arthropathy resulting in mild-to-moderate spinal canal stenosis with crowding of the subarticular zones and moderate bilateral neural foraminal stenosis. L4-L5: There is a diffuse disc bulge with a left foraminal component, degenerative endplate change, and mild bilateral facet arthropathy resulting in mild crowding of the subarticular zones without significant spinal canal stenosis and moderate left and mild right neural foraminal stenosis. L5-S1: There is a disc bulge eccentric to the right with a right foraminal component, degenerative endplate change, and bilateral facet arthropathy resulting in mild crowding of the subarticular zones, right worse than left, without evidence of nerve root impingement, and moderate to severe right and mild left neural foraminal stenosis. IMPRESSION: CERVICAL SPINE MRI: 1. Advanced degenerative change in the cervical spine as detailed above resulting in severe spinal canal stenosis with cord compression from C3-C4 through C5-C6. There is possible cord signal abnormality in the left aspect of the cord at C3-C4 which may reflect compressive myelopathy or myelomalacia. 2. Severe bilateral neural foraminal stenosis at C3-C4 through C5-C6 and moderate to severe left neural foraminal stenosis at C6-C7. 3. The right vertebral artery flow void is not definitely seen which could reflect chronic occlusion. This could be further evaluated with CTA or MRA of the neck as indicated. THORACIC SPINE MRI: 1. Mild multilevel degenerative changes. No high-grade spinal canal or neural foraminal stenosis. 2. Moderate bilateral pleural effusions. LUMBAR SPINE MRI: 1. Mild-to-moderate spinal canal stenosis at L3-L4 with crowding of the subarticular zones but no definite  evidence of nerve root impingement. There is more  mild subarticular zone narrowing at L2-L3, L4-L5, and L5-S1 also without evidence of nerve root impingement. 2. Moderate bilateral neural foraminal stenosis at L3-L4, moderate left neural foraminal stenosis at L4-L5, and moderate to severe right neural foraminal stenosis at L5-S1. 3. Relatively mild facet arthropathy throughout the lumbar spine. Electronically Signed   By: Valetta Mole M.D.   On: 02/28/2021 11:43   MR LUMBAR SPINE WO CONTRAST  Result Date: 02/28/2021 CLINICAL DATA:  Weakness EXAM: MRI CERVICAL, THORACIC AND LUMBAR SPINE WITHOUT CONTRAST TECHNIQUE: Multiplanar and multiecho pulse sequences of the cervical spine, to include the craniocervical junction and cervicothoracic junction, and thoracic and lumbar spine, were obtained without intravenous contrast. COMPARISON:  Cervical and lumbar spine CT 02/27/2021, CTA chest 09/30/2018, lumbar spine MRI 02/25/2015 FINDINGS: MRI CERVICAL SPINE FINDINGS Alignment: There is slight reversal of the normal cervical spine lordosis centered at C4. There is trace retrolisthesis of C5 on C6. Alignment is otherwise normal. Vertebrae: Vertebral body heights are preserved. Heterogeneous marrow signal at C3 through C7 is likely degenerative in nature. There is no suspicious marrow signal abnormality. Cord: The cord is compressed from C3-C4 through C5-C6. There is suspected signal abnormality in the left aspect of the cord at C3-C4 (4-14). No other definite cord signal abnormality is seen. Posterior Fossa, vertebral arteries, paraspinal tissues: The imaged posterior fossa is unremarkable. The paraspinal soft tissues are unremarkable. The left vertebral artery flow void is present. The right vertebral artery flow void is not definitely seen. Disc levels: There is marked disc space narrowing at C3-C4 through C6-C7. C2-C3: There is mild uncovertebral and bilateral facet arthropathy resulting in mild spinal canal stenosis  without significant neural foraminal stenosis C3-C4: There is a posterior disc osteophyte complex with a prominent broad-based protrusion, and uncovertebral and bilateral facet arthropathy resulting in severe spinal canal stenosis with cord compression and possible cord signal abnormality as above, and severe bilateral neural foraminal stenosis. C4-C5: There is a posterior disc osteophyte complex with a broad-based protrusion and uncovertebral and bilateral facet arthropathy resulting in severe spinal canal stenosis with cord compression and severe bilateral neural foraminal stenosis. C5-C6: There is a posterior disc osteophyte complex with a left paracentral protrusion and uncovertebral and facet arthropathy resulting in severe spinal canal stenosis with cord compression and severe bilateral neural foraminal stenosis. C6-C7: There is a mild posterior disc osteophyte complex and uncovertebral and bilateral facet arthropathy resulting in mild spinal canal stenosis and moderate to severe left and mild right neural foraminal stenosis C7-T1: No significant spinal canal or neural foraminal stenosis. MRI THORACIC SPINE FINDINGS Alignment:  Normal. Vertebrae: Vertebral body heights are preserved. There is mild degenerative endplate marrow signal abnormality at T10-T11 with associated prominent Schmorl's nodes indenting the adjacent endplates. A few smaller Schmorl's nodes are also noted in the upper thoracic spine. There is no suspicious marrow signal abnormality. Cord:  Normal in signal and morphology. Paraspinal and other soft tissues: There are moderate size bilateral pleural effusions. Paraspinal soft tissues are unremarkable. Disc levels: There is multilevel disc space narrowing and mild degenerative endplate change. There is no significant spinal canal or neural foraminal stenosis. MRI LUMBAR SPINE FINDINGS Segmentation: Standard; the lowest formed disc space is designated L5-S1. Alignment:  Normal. Vertebrae:  Vertebral body heights are preserved. There is degenerative endplate marrow signal abnormality at L5-S1. There is no suspicious marrow signal abnormality. Conus medullaris and cauda equina: Conus extends to the mid L1 level. Conus and cauda equina appear normal. Paraspinal and other soft tissues: There are  innumerable cysts in the bilateral atrophic kidneys. A T2 hyperintense lesion in the right hepatic lobe likely reflects a cyst. The paraspinal soft tissues are unremarkable. Disc levels: There is disc desiccation and narrowing at L5-S1. The disc spaces are otherwise overall preserved. There is relatively mild multilevel facet arthropathy throughout the lumbar spine. T12-L1: There is a minimal disc bulge without significant spinal canal or neural foraminal stenosis. L1-L2: There is a minimal disc bulge without significant spinal canal or neural foraminal stenosis. L2-L3: There is a mild disc bulge with bilateral foraminal components and mild degenerative endplate change and bilateral facet arthropathy resulting in mild left worse than right neural foraminal stenosis and mild subarticular zone narrowing without significant spinal canal stenosis. L3-L4: There is a diffuse disc bulge with bilateral foraminal components and degenerative endplate change and bilateral facet arthropathy resulting in mild-to-moderate spinal canal stenosis with crowding of the subarticular zones and moderate bilateral neural foraminal stenosis. L4-L5: There is a diffuse disc bulge with a left foraminal component, degenerative endplate change, and mild bilateral facet arthropathy resulting in mild crowding of the subarticular zones without significant spinal canal stenosis and moderate left and mild right neural foraminal stenosis. L5-S1: There is a disc bulge eccentric to the right with a right foraminal component, degenerative endplate change, and bilateral facet arthropathy resulting in mild crowding of the subarticular zones, right worse  than left, without evidence of nerve root impingement, and moderate to severe right and mild left neural foraminal stenosis. IMPRESSION: CERVICAL SPINE MRI: 1. Advanced degenerative change in the cervical spine as detailed above resulting in severe spinal canal stenosis with cord compression from C3-C4 through C5-C6. There is possible cord signal abnormality in the left aspect of the cord at C3-C4 which may reflect compressive myelopathy or myelomalacia. 2. Severe bilateral neural foraminal stenosis at C3-C4 through C5-C6 and moderate to severe left neural foraminal stenosis at C6-C7. 3. The right vertebral artery flow void is not definitely seen which could reflect chronic occlusion. This could be further evaluated with CTA or MRA of the neck as indicated. THORACIC SPINE MRI: 1. Mild multilevel degenerative changes. No high-grade spinal canal or neural foraminal stenosis. 2. Moderate bilateral pleural effusions. LUMBAR SPINE MRI: 1. Mild-to-moderate spinal canal stenosis at L3-L4 with crowding of the subarticular zones but no definite evidence of nerve root impingement. There is more mild subarticular zone narrowing at L2-L3, L4-L5, and L5-S1 also without evidence of nerve root impingement. 2. Moderate bilateral neural foraminal stenosis at L3-L4, moderate left neural foraminal stenosis at L4-L5, and moderate to severe right neural foraminal stenosis at L5-S1. 3. Relatively mild facet arthropathy throughout the lumbar spine. Electronically Signed   By: Valetta Mole M.D.   On: 02/28/2021 11:43    (feeding supplement) PROSource Plus  30 mL Oral TID BM   amiodarone  200 mg Oral Daily   amLODipine  10 mg Oral Daily   atorvastatin  80 mg Oral Q supper   Chlorhexidine Gluconate Cloth  6 each Topical Q0600   clopidogrel  75 mg Oral Q supper   heparin  5,000 Units Subcutaneous Q12H   lanthanum  2,000 mg Oral TID WC   pantoprazole  40 mg Oral Daily   vitamin B-12  1,000 mcg Oral QPM    BMET    Component Value  Date/Time   NA 133 (L) 03/02/2021 0223   NA 135 07/14/2018 0909   NA 141 03/14/2017 0921   K 3.8 03/02/2021 0223   K 4.1 03/14/2017 4315  CL 96 (L) 03/02/2021 0223   CO2 27 03/02/2021 0223   CO2 33 (H) 03/14/2017 0921   GLUCOSE 98 03/02/2021 0223   GLUCOSE 144 (H) 03/14/2017 0921   BUN 22 03/02/2021 0223   BUN 76 (HH) 07/14/2018 0909   BUN 30.1 (H) 03/14/2017 0921   CREATININE 5.31 (H) 03/02/2021 0223   CREATININE 9.16 (HH) 09/17/2017 0954   CREATININE 8.1 (HH) 03/14/2017 0921   CALCIUM 8.7 (L) 03/02/2021 0223   CALCIUM 9.5 03/14/2017 0921   GFRNONAA 11 (L) 03/02/2021 0223   GFRNONAA 5 (L) 09/17/2017 0954   GFRNONAA 24 (L) 06/30/2014 1528   GFRAA 8 (L) 05/27/2019 1457   GFRAA 6 (L) 09/17/2017 0954   GFRAA 28 (L) 06/30/2014 1528   CBC    Component Value Date/Time   WBC 3.6 (L) 03/02/2021 0223   RBC 2.72 (L) 03/02/2021 0223   HGB 8.4 (L) 03/02/2021 0223   HGB 11.4 (L) 11/12/2018 1637   HGB 10.8 (L) 03/14/2017 0921   HCT 26.6 (L) 03/02/2021 0223   HCT 35.0 (L) 11/12/2018 1637   HCT 32.5 (L) 03/14/2017 0921   PLT 103 (L) 03/02/2021 0223   PLT 51 (LL) 11/12/2018 1637   MCV 97.8 03/02/2021 0223   MCV 103 (H) 11/12/2018 1637   MCV 106.2 (H) 03/14/2017 0921   MCH 30.9 03/02/2021 0223   MCHC 31.6 03/02/2021 0223   RDW 15.2 03/02/2021 0223   RDW 14.6 11/12/2018 1637   RDW 14.6 03/14/2017 0921   LYMPHSABS 0.9 03/02/2021 0223   LYMPHSABS 1.0 11/12/2018 1637   LYMPHSABS 0.8 (L) 03/14/2017 0921   MONOABS 0.4 03/02/2021 0223   MONOABS 0.6 03/14/2017 0921   EOSABS 0.1 03/02/2021 0223   EOSABS 0.2 11/12/2018 1637   BASOSABS 0.0 03/02/2021 0223   BASOSABS 0.0 11/12/2018 1637   BASOSABS 0.0 03/14/2017 0921    Dialysis Orders:  MWF- [facility] 4hrs, BFR 400, DFR Auto 1.5,  Access:  RAVF  EDW 71.9kg, 2K/ 2Ca Heparin 2400 unit bolus Mircera 75 mcg q4wks - last 02/15/21 Calcitriol 38mcg PO qHD-last 02/27/21 Home meds:     Assessment/Plan: Spinal Cord  Compression-imaging shows advanced degenerative changes and spinal canal stenosis with cord compression. Neurosurgery consulted. Continue supportive care and pain management.  Pt is to discuss with his family before deciding to proceed with surgical intervention.  ESRD - On HD per usual schedule. CXR shows vascular congestion. Appears euvolemic on exam and not in resp distress. Tolerated HD well on 12/21 with net UF 1.7L. Next HD 03/03/21. Hypertension/volume-Bps varaible; monitor trend; continue Amlodipine and Hydralazine Anemia of CKD - Hgb 9.2; receives monthly ESA outpatient-monitor trends here, may need to resume Secondary Hyperparathyroidism - Ca ok, will order PO4 in AM. Continue binders Nutrition - Renal diet with fluid restriction; Albumin slightly low, will add protein supplements  Donetta Potts, MD [facility] [phone]

## 2021-03-02 NOTE — Plan of Care (Signed)
°  Problem: Education: Goal: Knowledge of General Education information will improve Description: Including pain rating scale, medication(s)/side effects and non-pharmacologic comfort measures Outcome: Progressing   Problem: Health Behavior/Discharge Planning: Goal: Ability to manage health-related needs will improve Outcome: Progressing   Problem: Clinical Measurements: Goal: Ability to maintain clinical measurements within normal limits will improve Outcome: Progressing Goal: Will remain free from infection Outcome: Progressing Goal: Diagnostic test results will improve Outcome: Progressing Goal: Respiratory complications will improve Outcome: Progressing Goal: Cardiovascular complication will be avoided Outcome: Progressing   Problem: Activity: Goal: Risk for activity intolerance will decrease Outcome: Progressing   Problem: Nutrition: Goal: Adequate nutrition will be maintained Outcome: Progressing   Problem: Coping: Goal: Level of anxiety will decrease 03/02/2021 0230 by Asencion Partridge, RN Outcome: Progressing 03/01/2021 2250 by Asencion Partridge, RN Outcome: Progressing   Problem: Elimination: Goal: Will not experience complications related to bowel motility 03/02/2021 0230 by Asencion Partridge, RN Outcome: Progressing 03/01/2021 2250 by Asencion Partridge, RN Outcome: Progressing Goal: Will not experience complications related to urinary retention 03/02/2021 0230 by Asencion Partridge, RN Outcome: Progressing 03/01/2021 2250 by Asencion Partridge, RN Outcome: Progressing   Problem: Pain Managment: Goal: General experience of comfort will improve 03/02/2021 0230 by Asencion Partridge, RN Outcome: Progressing 03/01/2021 2250 by Asencion Partridge, RN Outcome: Progressing   Problem: Safety: Goal: Ability to remain free from injury will improve Outcome: Progressing   Problem: Skin Integrity: Goal: Risk for impaired skin integrity will decrease Outcome:  Progressing

## 2021-03-03 DIAGNOSIS — G9520 Unspecified cord compression: Secondary | ICD-10-CM | POA: Diagnosis not present

## 2021-03-03 LAB — IRON AND TIBC
Iron: 60 ug/dL (ref 45–182)
Saturation Ratios: 22 % (ref 17.9–39.5)
TIBC: 273 ug/dL (ref 250–450)
UIBC: 213 ug/dL

## 2021-03-03 LAB — CBC
HCT: 28 % — ABNORMAL LOW (ref 39.0–52.0)
Hemoglobin: 8.7 g/dL — ABNORMAL LOW (ref 13.0–17.0)
MCH: 30.2 pg (ref 26.0–34.0)
MCHC: 31.1 g/dL (ref 30.0–36.0)
MCV: 97.2 fL (ref 80.0–100.0)
Platelets: 115 10*3/uL — ABNORMAL LOW (ref 150–400)
RBC: 2.88 MIL/uL — ABNORMAL LOW (ref 4.22–5.81)
RDW: 15.2 % (ref 11.5–15.5)
WBC: 4.1 10*3/uL (ref 4.0–10.5)
nRBC: 0 % (ref 0.0–0.2)

## 2021-03-03 LAB — FERRITIN: Ferritin: 694 ng/mL — ABNORMAL HIGH (ref 24–336)

## 2021-03-03 LAB — BASIC METABOLIC PANEL
Anion gap: 9 (ref 5–15)
BUN: 33 mg/dL — ABNORMAL HIGH (ref 8–23)
CO2: 29 mmol/L (ref 22–32)
Calcium: 9.2 mg/dL (ref 8.9–10.3)
Chloride: 99 mmol/L (ref 98–111)
Creatinine, Ser: 7.43 mg/dL — ABNORMAL HIGH (ref 0.61–1.24)
GFR, Estimated: 7 mL/min — ABNORMAL LOW (ref >60)
Glucose, Bld: 115 mg/dL — ABNORMAL HIGH (ref 70–99)
Potassium: 4.1 mmol/L (ref 3.5–5.1)
Sodium: 137 mmol/L (ref 135–145)

## 2021-03-03 MED ORDER — DARBEPOETIN ALFA 100 MCG/0.5ML IJ SOSY
100.0000 ug | PREFILLED_SYRINGE | INTRAMUSCULAR | Status: DC
  Filled 2021-03-03: qty 0.5

## 2021-03-03 MED ORDER — HEPARIN SODIUM (PORCINE) 1000 UNIT/ML DIALYSIS: 20.0000 [IU]/kg | INTRAMUSCULAR | Status: DC | PRN

## 2021-03-03 NOTE — Progress Notes (Signed)
Pt receives out-pt HD at [facility] on MWF. Pt arrives at 11:30 for 11:50 chair time. Will assist as needed.  Melven Sartorius Renal Navigator [phone]

## 2021-03-03 NOTE — Care Management Important Message (Signed)
Important Message  Patient Details  Name: Jose Garrett. MRN: [id number] Date of Birth: 08/19/1950   Medicare Important Message Given:  Yes     Shanty Ginty 03/03/2021, 3:18 PM

## 2021-03-03 NOTE — Plan of Care (Signed)
°  Problem: Clinical Measurements: Goal: Will remain free from infection Outcome: Progressing Goal: Respiratory complications will improve Outcome: Progressing   Problem: Activity: Goal: Risk for activity intolerance will decrease Outcome: Progressing   Problem: Nutrition: Goal: Adequate nutrition will be maintained Outcome: Progressing   Problem: Coping: Goal: Level of anxiety will decrease Outcome: Progressing   Problem: Safety: Goal: Ability to remain free from injury will improve Outcome: Progressing   Problem: Skin Integrity: Goal: Risk for impaired skin integrity will decrease Outcome: Progressing   

## 2021-03-03 NOTE — Progress Notes (Signed)
PROGRESS NOTE  Jose Garrett. GXQ:[id number] DOB: 07-04-1950 DOA: 02/27/2021 PCP: [name], NP   LOS: 3 days   Brief Narrative / Interim history: 70 year old male with amyloidosis, chronic combined CHF, ESRD, CAD, peripheral vascular disease status post right BKA, severe AS status post TAVR comes to the hospital with increased weakness of bilateral arms, hands, fingers as well as wheelchair-bound for the past 3 weeks.  Patient has been unable to lift his arms above the shoulder which is completely new.  Imaging in the ED showed subacute spinal cord compression.  Neurosurgery consulted  Subjective / 24h Interval events: Seen in dialysis.  Weakness persists.  Has been talking to his family regarding potential surgery, tells me his daughter will touch base directly with the neurosurgeon to discuss this further.  Assessment & Plan: Principal Problem Subacute C-spine cord compression-MRI on admission showed advanced degenerative changes resulting in severe spinal canal stenosis with cord compression from C3-4 through C5-6.  Neurosurgery consulted and following.  Neurosurgery will discuss with the patient and his family, hopefully today, regarding benefits/risks of surgery, however he is high risk  All his medical problems below remain stable  Active Problems Chronic combined CHF-most recent 2D echo in August 2021 showed an EF of 45-50%, mild decreased LV function with global hypokinesis.  There was also grade 1 diastolic dysfunction.  RV was mildly reduced as well.  Currently appears euvolemic.  Undergoing HD this morning  History of severe aortic stenosis-status post TAVR by Dr. Burt Knack in 2020.  Peripheral vascular disease-seen by vascular surgery as an outpatient, status post BKA on the right.  He has a femoropopliteal bypass graft on the left  Coronary artery disease-most recent cath was done in May 2020 which showed severe single-vessel CAD with chronic total occlusion of the RCA, with  collaterals.  Mild calcific nonobstructive LCA disease.  Continue Plavix and statin. No chest pain.  If decision is made to proceed with surgery Plavix will need to be discontinued  HTN, uncontrolled -Resume home BP meds amlodipine   ESRD on HD -Continue scheduled HD per nephrology    Chronic anemia secondary to ESRD-On EPO treatment  Scheduled Meds:  (feeding supplement) PROSource Plus  30 mL Oral TID BM   amiodarone  200 mg Oral Daily   amLODipine  10 mg Oral Daily   atorvastatin  80 mg Oral Q supper   Chlorhexidine Gluconate Cloth  6 each Topical Q0600   clopidogrel  75 mg Oral Q supper   darbepoetin (ARANESP) injection - DIALYSIS  100 mcg Intravenous Q Fri-HD   heparin  5,000 Units Subcutaneous Q12H   lanthanum  2,000 mg Oral TID WC   pantoprazole  40 mg Oral Daily   vitamin B-12  1,000 mcg Oral QPM   Continuous Infusions:   PRN Meds:.acetaminophen, albuterol, docusate sodium, hydrALAZINE, LORazepam, ondansetron, oxyCODONE-acetaminophen  Diet Orders (From admission, onward)     Start     Ordered   02/28/21 1250  Diet renal with fluid restriction Fluid restriction: 1200 mL Fluid; Room service appropriate? Yes; Fluid consistency: Thin  Diet effective now       Question Answer Comment  Fluid restriction: 1200 mL Fluid   Room service appropriate? Yes   Fluid consistency: Thin      02/28/21 1249            DVT prophylaxis: heparin injection 5,000 Units Start: 02/28/21 1300     Code Status: Full Code  Family Communication: no family at bedside  Status is: Inpatient  Remains inpatient appropriate because: pending surgical evaluation  Level of care: Telemetry Medical  Consultants:  Neurosurgery   Procedures:  none  Microbiology  none  Antimicrobials: none    Objective: Vitals:   03/02/21 1917 03/02/21 2300 03/02/21 2333 03/03/21 0315  BP: (!) 147/45 (!) 144/56  (!) 146/48  Pulse: 63  63 70  Resp: 18  16   Temp: 98.7 F (37.1 C)  98 F (36.7 C)  98.3 F (36.8 C)  TempSrc:   Axillary Oral  SpO2: 95%  100% 100%  Weight:      Height:        Intake/Output Summary (Last 24 hours) at 03/03/2021 0911 Last data filed at 03/02/2021 1900 Gross per 24 hour  Intake 920 ml  Output --  Net 920 ml    Filed Weights   02/27/21 1828 03/01/21 1309 03/01/21 1636  Weight: 73 kg 74.9 kg 73.7 kg    Examination:  Constitutional: No distress Eyes: No scleral icterus ENMT: Moist mucous membranes Neck: normal, supple Respiratory: Clear bilaterally without wheezing Cardiovascular: Regular rate and rhythm, no murmurs, no edema Abdomen: Soft, NT, ND, bowel sounds positive Musculoskeletal: no clubbing / cyanosis.  Skin: No rashes appreciated Neurologic:  Global weakness, no new focal deficits  Data Reviewed: I have independently reviewed following labs and imaging studies   CBC: Recent Labs  Lab 02/27/21 1901 03/02/21 0223 03/03/21 0149  WBC 6.1 3.6* 4.1  NEUTROABS  --  2.2  --   HGB 9.2* 8.4* 8.7*  HCT 28.2* 26.6* 28.0*  MCV 97.2 97.8 97.2  PLT 128* 103* 115*    Basic Metabolic Panel: Recent Labs  Lab 02/27/21 1901 03/01/21 0302 03/02/21 0223 03/03/21 0149  NA 137 135 133* 137  K 3.9 4.4 3.8 4.1  CL 94* 95* 96* 99  CO2 29 26 27 29   GLUCOSE 84 96 98 115*  BUN 23 46* 22 33*  CREATININE 5.12* 8.45* 5.31* 7.43*  CALCIUM 9.1 9.4 8.7* 9.2  MG 2.1  --   --   --   PHOS  --   --  5.2*  --     Liver Function Tests: Recent Labs  Lab 02/27/21 1901 03/02/21 0223  AST 29  --   ALT 18  --   ALKPHOS 67  --   BILITOT 1.3*  --   PROT 6.9  --   ALBUMIN 3.6 3.1*    Coagulation Profile: No results for input(s): INR, PROTIME in the last 168 hours. HbA1C: No results for input(s): HGBA1C in the last 72 hours. CBG: Recent Labs  Lab 02/27/21 1901  GLUCAP 112*     Recent Results (from the past 240 hour(s))  Resp Panel by RT-PCR (Flu A&B, Covid) Nasopharyngeal Swab     Status: None   Collection Time: 03/01/21  8:07 AM    Specimen: Nasopharyngeal Swab; Nasopharyngeal(NP) swabs in vial transport medium  Result Value Ref Range Status   SARS Coronavirus 2 by RT PCR NEGATIVE NEGATIVE Final    Comment: (NOTE) SARS-CoV-2 target nucleic acids are NOT DETECTED.  The SARS-CoV-2 RNA is generally detectable in upper respiratory specimens during the acute phase of infection. The lowest concentration of SARS-CoV-2 viral copies this assay can detect is 138 copies/mL. A negative result does not preclude SARS-Cov-2 infection and should not be used as the sole basis for treatment or other patient management decisions. A negative result may occur with  improper specimen collection/handling, submission of specimen other than  nasopharyngeal swab, presence of viral mutation(s) within the areas targeted by this assay, and inadequate number of viral copies(<138 copies/mL). A negative result must be combined with clinical observations, patient history, and epidemiological information. The expected result is Negative.  Fact Sheet for Patients:  EntrepreneurPulse.com.au  Fact Sheet for Healthcare Providers:  IncredibleEmployment.be  This test is no t yet approved or cleared by the [place] FDA and  has been authorized for detection and/or diagnosis of SARS-CoV-2 by FDA under an Emergency Use Authorization (EUA). This EUA will remain  in effect (meaning this test can be used) for the duration of the COVID-19 declaration under Section 564(b)(1) of the Act, 21 U.S.C.section 360bbb-3(b)(1), unless the authorization is terminated  or revoked sooner.       Influenza A by PCR NEGATIVE NEGATIVE Final   Influenza B by PCR NEGATIVE NEGATIVE Final    Comment: (NOTE) The Xpert Xpress SARS-CoV-2/FLU/RSV plus assay is intended as an aid in the diagnosis of influenza from Nasopharyngeal swab specimens and should not be used as a sole basis for treatment. Nasal washings and aspirates are  unacceptable for Xpert Xpress SARS-CoV-2/FLU/RSV testing.  Fact Sheet for Patients: EntrepreneurPulse.com.au  Fact Sheet for Healthcare Providers: IncredibleEmployment.be  This test is not yet approved or cleared by the [place] FDA and has been authorized for detection and/or diagnosis of SARS-CoV-2 by FDA under an Emergency Use Authorization (EUA). This EUA will remain in effect (meaning this test can be used) for the duration of the COVID-19 declaration under Section 564(b)(1) of the Act, 21 U.S.C. section 360bbb-3(b)(1), unless the authorization is terminated or revoked.  Performed at [facility], [place] [address], [place], [place] [address]       Radiology Studies: No results found.   Marzetta Board, MD, PhD [facility]  Between 7 am - 7 pm I am available, please contact me via Amion (for emergencies) or Securechat (non urgent messages)  Between 7 pm - 7 am I am not available, please contact night coverage MD/APP via Amion

## 2021-03-03 NOTE — Progress Notes (Signed)
Patient ID: Jose Acree., male   DOB: 1951/01/02, 70 y.o.   MRN: [id number] S: No new complaints this morning.  Is in the process of deciding on surgery-has been talking to family members O:BP (!) 146/48 (BP Location: Left Arm)    Pulse 70    Temp 98.3 F (36.8 C) (Oral)    Resp 16    Ht 5\' 10"  (1.778 m)    Wt 73.7 kg    SpO2 100%    BMI 23.31 kg/m   Intake/Output Summary (Last 24 hours) at 03/03/2021 0755 Last data filed at 03/02/2021 1900 Gross per 24 hour  Intake 920 ml  Output --  Net 920 ml   Intake/Output: I/O last 3 completed shifts: In: 920 [P.O.:920] Out: -   Intake/Output this shift:  No intake/output data recorded. Weight change:  Gen: NAD CVS: RRR Resp:CTA Abd: +BS, soft, NT/nd Ext: rt bka, no edema Neuro: awake, alert Dialysis access: R AVF +T/B  Recent Labs  Lab 02/27/21 1901 03/01/21 0302 03/02/21 0223 03/03/21 0149  NA 137 135 133* 137  K 3.9 4.4 3.8 4.1  CL 94* 95* 96* 99  CO2 29 26 27 29   GLUCOSE 84 96 98 115*  BUN 23 46* 22 33*  CREATININE 5.12* 8.45* 5.31* 7.43*  ALBUMIN 3.6  --  3.1*  --   CALCIUM 9.1 9.4 8.7* 9.2  PHOS  --   --  5.2*  --   AST 29  --   --   --   ALT 18  --   --   --    Liver Function Tests: Recent Labs  Lab 02/27/21 1901 03/02/21 0223  AST 29  --   ALT 18  --   ALKPHOS 67  --   BILITOT 1.3*  --   PROT 6.9  --   ALBUMIN 3.6 3.1*   No results for input(s): LIPASE, AMYLASE in the last 168 hours. No results for input(s): AMMONIA in the last 168 hours. CBC: Recent Labs  Lab 02/27/21 1901 03/02/21 0223 03/03/21 0149  WBC 6.1 3.6* 4.1  NEUTROABS  --  2.2  --   HGB 9.2* 8.4* 8.7*  HCT 28.2* 26.6* 28.0*  MCV 97.2 97.8 97.2  PLT 128* 103* 115*   Cardiac Enzymes: No results for input(s): CKTOTAL, CKMB, CKMBINDEX, TROPONINI in the last 168 hours. CBG: Recent Labs  Lab 02/27/21 1901  GLUCAP 112*    Iron Studies: No results for input(s): IRON, TIBC, TRANSFERRIN, FERRITIN in the last 72  hours. Studies/Results: No results found.  (feeding supplement) PROSource Plus  30 mL Oral TID BM   amiodarone  200 mg Oral Daily   amLODipine  10 mg Oral Daily   atorvastatin  80 mg Oral Q supper   Chlorhexidine Gluconate Cloth  6 each Topical Q0600   clopidogrel  75 mg Oral Q supper   heparin  5,000 Units Subcutaneous Q12H   lanthanum  2,000 mg Oral TID WC   pantoprazole  40 mg Oral Daily   vitamin B-12  1,000 mcg Oral QPM    BMET    Component Value Date/Time   NA 137 03/03/2021 0149   NA 135 07/14/2018 0909   NA 141 03/14/2017 0921   K 4.1 03/03/2021 0149   K 4.1 03/14/2017 0921   CL 99 03/03/2021 0149   CO2 29 03/03/2021 0149   CO2 33 (H) 03/14/2017 0921   GLUCOSE 115 (H) 03/03/2021 0149   GLUCOSE 144 (H) 03/14/2017  0921   BUN 33 (H) 03/03/2021 0149   BUN 76 (HH) 07/14/2018 0909   BUN 30.1 (H) 03/14/2017 0921   CREATININE 7.43 (H) 03/03/2021 0149   CREATININE 9.16 (HH) 09/17/2017 0954   CREATININE 8.1 (HH) 03/14/2017 0921   CALCIUM 9.2 03/03/2021 0149   CALCIUM 9.5 03/14/2017 0921   GFRNONAA 7 (L) 03/03/2021 0149   GFRNONAA 5 (L) 09/17/2017 0954   GFRNONAA 24 (L) 06/30/2014 1528   GFRAA 8 (L) 05/27/2019 1457   GFRAA 6 (L) 09/17/2017 0954   GFRAA 28 (L) 06/30/2014 1528   CBC    Component Value Date/Time   WBC 4.1 03/03/2021 0149   RBC 2.88 (L) 03/03/2021 0149   HGB 8.7 (L) 03/03/2021 0149   HGB 11.4 (L) 11/12/2018 1637   HGB 10.8 (L) 03/14/2017 0921   HCT 28.0 (L) 03/03/2021 0149   HCT 35.0 (L) 11/12/2018 1637   HCT 32.5 (L) 03/14/2017 0921   PLT 115 (L) 03/03/2021 0149   PLT 51 (LL) 11/12/2018 1637   MCV 97.2 03/03/2021 0149   MCV 103 (H) 11/12/2018 1637   MCV 106.2 (H) 03/14/2017 0921   MCH 30.2 03/03/2021 0149   MCHC 31.1 03/03/2021 0149   RDW 15.2 03/03/2021 0149   RDW 14.6 11/12/2018 1637   RDW 14.6 03/14/2017 0921   LYMPHSABS 0.9 03/02/2021 0223   LYMPHSABS 1.0 11/12/2018 1637   LYMPHSABS 0.8 (L) 03/14/2017 0921   MONOABS 0.4 03/02/2021 0223    MONOABS 0.6 03/14/2017 0921   EOSABS 0.1 03/02/2021 0223   EOSABS 0.2 11/12/2018 1637   BASOSABS 0.0 03/02/2021 0223   BASOSABS 0.0 11/12/2018 1637   BASOSABS 0.0 03/14/2017 0921    Dialysis Orders:  MWF- [facility] 4hrs, BFR 400, DFR Auto 1.5,  Access:  RAVF  EDW 71.9kg, 2K/ 2Ca Heparin 2400 unit bolus Mircera 75 mcg q4wks - last 02/15/21 Calcitriol 17mcg PO qHD-last 02/27/21 Home meds:     Assessment/Plan: Spinal Cord Compression-imaging shows advanced degenerative changes and spinal canal stenosis with cord compression. Neurosurgery consulted. Continue supportive care and pain management per primary.  Pt discussing with his family before deciding to proceed with surgical intervention.  ESRD - On HD per usual MWF schedule. Appears euvolemic on exam and not in resp distress. Tolerated HD well on 12/21 with net UF 1.7L. Next HD today 03/03/21. Hypertension/volume-Bps varaible; monitor trend; continue on Amlodipine and Hydralazine Anemia of CKD - Hgb down to 8.7, will resume esa here-first dose 12/23 Secondary Hyperparathyroidism - on lanthanum Nutrition - Renal diet with fluid restriction; protein supplements  Gean Quint, MD [facility]

## 2021-03-03 NOTE — Plan of Care (Signed)

## 2021-03-03 NOTE — Progress Notes (Signed)
PT Cancellation Note  Patient Details Name: Jose Garrett. MRN: [id number] DOB: 12-19-1950   Cancelled Treatment:    Reason Eval/Treat Not Completed: (P) Patient at procedure or test/unavailable (pt at HD dept.) Will continue efforts per PT plan of care as schedule permits.   Vickey Boak M [facility] Cleary 03/03/2021, 10:07 AM

## 2021-03-03 NOTE — Progress Notes (Signed)
I had a lengthy conversation today with the patient, as well as his daughter over the phone. Specifically, I did review the situation with the patient's daughter including the fact that he is relatively high risk for serious perioperative morbidity or even mortality. Unfortunately, I did tell her that we can't really predict the progression of his myelopathy symptoms; his symptoms may stop progressing, or they may continue. Ulti mately, she indicated to me that she felt the risks of surgery are likely not worth the benefit but will speak with him again later today. I will follow up with them tomorrow to reach some kind of decision as to how to proceed.  Consuella Lose, MD [facility]

## 2021-03-04 DIAGNOSIS — Z992 Dependence on renal dialysis: Secondary | ICD-10-CM

## 2021-03-04 DIAGNOSIS — G9520 Unspecified cord compression: Secondary | ICD-10-CM | POA: Diagnosis not present

## 2021-03-04 DIAGNOSIS — D638 Anemia in other chronic diseases classified elsewhere: Secondary | ICD-10-CM | POA: Diagnosis not present

## 2021-03-04 DIAGNOSIS — N186 End stage renal disease: Secondary | ICD-10-CM

## 2021-03-04 MED ORDER — LOPERAMIDE HCL 2 MG PO CAPS
2.0000 mg | ORAL_CAPSULE | ORAL | Status: DC | PRN
  Administered 2021-03-04: 04:00:00 2 mg via ORAL
  Filled 2021-03-04 (×2): qty 1

## 2021-03-04 MED ORDER — DARBEPOETIN ALFA 100 MCG/0.5ML IJ SOSY
100.0000 ug | PREFILLED_SYRINGE | INTRAMUSCULAR | Status: DC
  Filled 2021-03-04: qty 0.5

## 2021-03-04 MED ORDER — DARBEPOETIN ALFA 100 MCG/0.5ML IJ SOSY: 100.0000 ug | PREFILLED_SYRINGE | INTRAMUSCULAR | Status: DC

## 2021-03-04 NOTE — Progress Notes (Signed)
Patient ID: Jose Kamath., male   DOB: 03-26-1950, 70 y.o.   MRN: [id number] S: No new complaints this morning. Tolerated hd yesterday net uf 2450cc O:BP (!) 132/54    Pulse 64    Temp 98.4 F (36.9 C)    Resp 18    Ht 5\' 10"  (1.778 m)    Wt 71.6 kg    SpO2 97%    BMI 22.65 kg/m   Intake/Output Summary (Last 24 hours) at 03/04/2021 0914 Last data filed at 03/03/2021 2000 Gross per 24 hour  Intake 505 ml  Output 2450 ml  Net -1945 ml   Intake/Output: I/O last 3 completed shifts: In: 505 [P.O.:480; Other:25] Out: 0017 [Other:2450]  Intake/Output this shift:  No intake/output data recorded. Weight change:  Gen: NAD, laying flat in bed CVS: RRR Resp:CTA Abd: +BS, soft, NT/nd Ext: rt bka, no edema Neuro: awake, alert Dialysis access: R AVF +T/B  Recent Labs  Lab 02/27/21 1901 03/01/21 0302 03/02/21 0223 03/03/21 0149  NA 137 135 133* 137  K 3.9 4.4 3.8 4.1  CL 94* 95* 96* 99  CO2 29 26 27 29   GLUCOSE 84 96 98 115*  BUN 23 46* 22 33*  CREATININE 5.12* 8.45* 5.31* 7.43*  ALBUMIN 3.6  --  3.1*  --   CALCIUM 9.1 9.4 8.7* 9.2  PHOS  --   --  5.2*  --   AST 29  --   --   --   ALT 18  --   --   --    Liver Function Tests: Recent Labs  Lab 02/27/21 1901 03/02/21 0223  AST 29  --   ALT 18  --   ALKPHOS 67  --   BILITOT 1.3*  --   PROT 6.9  --   ALBUMIN 3.6 3.1*   No results for input(s): LIPASE, AMYLASE in the last 168 hours. No results for input(s): AMMONIA in the last 168 hours. CBC: Recent Labs  Lab 02/27/21 1901 03/02/21 0223 03/03/21 0149  WBC 6.1 3.6* 4.1  NEUTROABS  --  2.2  --   HGB 9.2* 8.4* 8.7*  HCT 28.2* 26.6* 28.0*  MCV 97.2 97.8 97.2  PLT 128* 103* 115*   Cardiac Enzymes: No results for input(s): CKTOTAL, CKMB, CKMBINDEX, TROPONINI in the last 168 hours. CBG: Recent Labs  Lab 02/27/21 1901  GLUCAP 112*    Iron Studies:  Recent Labs    03/03/21 1341  IRON 60  TIBC 273  FERRITIN 694*   Studies/Results: No results found.   (feeding supplement) PROSource Plus  30 mL Oral TID BM   amiodarone  200 mg Oral Daily   amLODipine  10 mg Oral Daily   atorvastatin  80 mg Oral Q supper   Chlorhexidine Gluconate Cloth  6 each Topical Q0600   clopidogrel  75 mg Oral Q supper   darbepoetin (ARANESP) injection - DIALYSIS  100 mcg Intravenous Q Fri-HD   heparin  5,000 Units Subcutaneous Q12H   lanthanum  2,000 mg Oral TID WC   pantoprazole  40 mg Oral Daily   vitamin B-12  1,000 mcg Oral QPM    BMET    Component Value Date/Time   NA 137 03/03/2021 0149   NA 135 07/14/2018 0909   NA 141 03/14/2017 0921   K 4.1 03/03/2021 0149   K 4.1 03/14/2017 0921   CL 99 03/03/2021 0149   CO2 29 03/03/2021 0149   CO2 33 (H) 03/14/2017 4944  GLUCOSE 115 (H) 03/03/2021 0149   GLUCOSE 144 (H) 03/14/2017 0921   BUN 33 (H) 03/03/2021 0149   BUN 76 (HH) 07/14/2018 0909   BUN 30.1 (H) 03/14/2017 0921   CREATININE 7.43 (H) 03/03/2021 0149   CREATININE 9.16 (HH) 09/17/2017 0954   CREATININE 8.1 (HH) 03/14/2017 0921   CALCIUM 9.2 03/03/2021 0149   CALCIUM 9.5 03/14/2017 0921   GFRNONAA 7 (L) 03/03/2021 0149   GFRNONAA 5 (L) 09/17/2017 0954   GFRNONAA 24 (L) 06/30/2014 1528   GFRAA 8 (L) 05/27/2019 1457   GFRAA 6 (L) 09/17/2017 0954   GFRAA 28 (L) 06/30/2014 1528   CBC    Component Value Date/Time   WBC 4.1 03/03/2021 0149   RBC 2.88 (L) 03/03/2021 0149   HGB 8.7 (L) 03/03/2021 0149   HGB 11.4 (L) 11/12/2018 1637   HGB 10.8 (L) 03/14/2017 0921   HCT 28.0 (L) 03/03/2021 0149   HCT 35.0 (L) 11/12/2018 1637   HCT 32.5 (L) 03/14/2017 0921   PLT 115 (L) 03/03/2021 0149   PLT 51 (LL) 11/12/2018 1637   MCV 97.2 03/03/2021 0149   MCV 103 (H) 11/12/2018 1637   MCV 106.2 (H) 03/14/2017 0921   MCH 30.2 03/03/2021 0149   MCHC 31.1 03/03/2021 0149   RDW 15.2 03/03/2021 0149   RDW 14.6 11/12/2018 1637   RDW 14.6 03/14/2017 0921   LYMPHSABS 0.9 03/02/2021 0223   LYMPHSABS 1.0 11/12/2018 1637   LYMPHSABS 0.8 (L) 03/14/2017 0921    MONOABS 0.4 03/02/2021 0223   MONOABS 0.6 03/14/2017 0921   EOSABS 0.1 03/02/2021 0223   EOSABS 0.2 11/12/2018 1637   BASOSABS 0.0 03/02/2021 0223   BASOSABS 0.0 11/12/2018 1637   BASOSABS 0.0 03/14/2017 0921    Dialysis Orders:  MWF- [facility] 4hrs, BFR 400, DFR Auto 1.5,  Access:  RAVF  EDW 71.9kg, 2K/ 2Ca Heparin 2400 unit bolus Mircera 75 mcg q4wks - last 02/15/21 Calcitriol 44mcg PO qHD-last 02/27/21 Home meds:     Assessment/Plan: Spinal Cord Compression-imaging shows advanced degenerative changes and spinal canal stenosis with cord compression. Neurosurgery consulted. Continue supportive care and pain management per primary.  High risk for surgery, patient is discussing with his family about next steps ESRD - On HD per usual MWF schedule. Next HD today 03/06/21. Hypertension/volume-Bps varaible; monitor trend; continue on Amlodipine and Hydralazine. Relatively euvolemic on exam Anemia of CKD - Hgb down to 8.7, resumed esa here-first dose 12/23 Secondary Hyperparathyroidism - on lanthanum Nutrition - Renal diet with fluid restriction; protein supplements  Gean Quint, MD [facility]

## 2021-03-04 NOTE — Plan of Care (Signed)
  Problem: Coping: Goal: Level of anxiety will decrease Outcome: Progressing   Problem: Elimination: Goal: Will not experience complications related to bowel motility Outcome: Progressing Goal: Will not experience complications related to urinary retention Outcome: Progressing   Problem: Pain Managment: Goal: General experience of comfort will improve Outcome: Progressing   Problem: Safety: Goal: Ability to remain free from injury will improve Outcome: Progressing   Problem: Skin Integrity: Goal: Risk for impaired skin integrity will decrease Outcome: Progressing   

## 2021-03-04 NOTE — Progress Notes (Signed)
°  NEUROSURGERY PROGRESS NOTE   No issues overnight. Pt denies any new changes  EXAM:  BP (!) 132/54    Pulse 64    Temp 98.4 F (36.9 C)    Resp 18    Ht 5\' 10"  (1.778 m)    Wt 71.6 kg    SpO2 97%    BMI 22.65 kg/m   Awake, alert, oriented  Speech fluent, appropriate  CN grossly intact  Unchanged diffuse 4/5 weakness BUE/BLE  IMPRESSION:  70 y.o. male  with multiple major medical comorbidities including CAD s/p angioplasty, AS s/p TAVR, CFH, PVD s/p right BKA. Presented with several weeks of worsening BUE/BLE weakness likely related to severe cervical stenosis C3-6. Obviously his risk of serious perioperative morbidity/mortality is quite high.   PLAN: - I have reviewed the situation again with the patient, and spoke to his eldest daughter yesterday. He would like to speak to his younger daughter as well prior to making any decision on surgery - Cont supportive care including HD and PT/OT. - If he decides to proceed with ACDF C3-C6, we can then d/c his plavix. He will have to be off for at least 5 days prior to surgery. I will be off starting tomorrow so realistically if he decides to proceed, his surgery would be the week of 03/13/21.   Consuella Lose, MD [facility]

## 2021-03-04 NOTE — Progress Notes (Signed)
Patient ID: Jose Fultz., male   DOB: 17-Aug-1950, 70 y.o.   MRN: [id number]  PROGRESS NOTE    Jose Cramp.  TDH:[id number] DOB: 23-Feb-1951 DOA: 02/27/2021 PCP: [name], NP   Brief Narrative:  70 year old male with amyloidosis, chronic combined CHF, ESRD, CAD, peripheral vascular disease status post right BKA, severe AS status post TAVR presented to the hospital with increased weakness of bilateral arms, hands, fingers as well as wheelchair-bound for the past 3 weeks.  Patient has been unable to lift his arms above the shoulder which is completely new.  Imaging in the ED showed subacute spinal cord compression.  Neurosurgery and nephrology were consulted.  Assessment & Plan:   Subacute C-spine cord compression -MRI on admission showed advanced degenerative changes resulting in severe spinal canal stenosis with cord compression from C3-4 through C5-6.   -Neurosurgery following: Patient is still not decided if he wants to proceed with surgery.   Chronic combined CHF -most recent 2D echo in [place] 2021 showed an EF of 45-50%, mild decreased LV function with global hypokinesis.  There was also grade 1 diastolic dysfunction.  RV was mildly reduced as well.   -Currently appears euvolemic.  -Volume managed by dialysis.   History of severe aortic stenosis-status post TAVR by Dr. Burt Knack in 2020.   Peripheral vascular disease -seen by vascular surgery as an outpatient, status post BKA on the right.  He has a femoropopliteal bypass graft on the left   Coronary artery disease -most recent cath was done in May 2020 which showed severe single-vessel CAD with chronic total occlusion of the RCA, with collaterals.  Mild calcific nonobstructive LCA disease.   -Continue Plavix and statin. No chest pain.  If decision is made to proceed with surgery Plavix will need to be discontinued   HTN, uncontrolled  -Blood pressure intermittently on the higher side.  Continue amlodipine.   ESRD on HD  -Continue scheduled HD per nephrology    Chronic anemia secondary to ESRD -Hemoglobin currently stable.  Monitor intermittently.  Thrombocytopenia -Questionable cause.  No signs of bleeding.  Monitor intermittently.    DVT prophylaxis: Heparin Code Status: Full Family Communication: None at bedside Disposition Plan: Status is: Inpatient  Remains inpatient appropriate because: Of severity of illness  Consultants: Neurosurgery/nephrology  Procedures: None  Antimicrobials: None   Subjective: Patient seen and examined at bedside.  Denies worsening shortness of breath, nausea or vomiting.  Still undecided about having surgery or not.  Objective: Vitals:   03/04/21 0001 03/04/21 0401 03/04/21 0805 03/04/21 0815  BP: (!) 131/110 (!) 162/61 (!) 153/48 (!) 132/54  Pulse: 68 74 64   Resp: 16 17 18    Temp: 98.1 F (36.7 C) 98 F (36.7 C) 98.4 F (36.9 C)   TempSrc: Oral Oral    SpO2: 96% (!) 89% 97%   Weight:      Height:        Intake/Output Summary (Last 24 hours) at 03/04/2021 1111 Last data filed at 03/03/2021 2000 Gross per 24 hour  Intake 505 ml  Output 2450 ml  Net -1945 ml   Filed Weights   03/01/21 1636 03/03/21 0824 03/03/21 1207  Weight: 73.7 kg 74 kg 71.6 kg    Examination:  General exam: Appears calm and comfortable.  Looks chronically ill and deconditioned.  Currently on room air. Respiratory system: Bilateral decreased breath sounds at bases with some scattered crackles Cardiovascular system: S1 & S2 heard, Rate controlled Gastrointestinal system: Abdomen is nondistended,  soft and nontender. Normal bowel sounds heard. Extremities: Right BKA present; no cyanosis/clubbing on left lower extremity Central nervous system: Alert and oriented.  Slow to respond.  No focal neurological deficits. Moving extremities Skin: No rashes, lesions or ulcers Psychiatry: Affect is mostly flat.   Data Reviewed: I have personally reviewed following labs and imaging  studies  CBC: Recent Labs  Lab 02/27/21 1901 03/02/21 0223 03/03/21 0149  WBC 6.1 3.6* 4.1  NEUTROABS  --  2.2  --   HGB 9.2* 8.4* 8.7*  HCT 28.2* 26.6* 28.0*  MCV 97.2 97.8 97.2  PLT 128* 103* 622*   Basic Metabolic Panel: Recent Labs  Lab 02/27/21 1901 03/01/21 0302 03/02/21 0223 03/03/21 0149  NA 137 135 133* 137  K 3.9 4.4 3.8 4.1  CL 94* 95* 96* 99  CO2 29 26 27 29   GLUCOSE 84 96 98 115*  BUN 23 46* 22 33*  CREATININE 5.12* 8.45* 5.31* 7.43*  CALCIUM 9.1 9.4 8.7* 9.2  MG 2.1  --   --   --   PHOS  --   --  5.2*  --    GFR: Estimated Creatinine Clearance: 9.6 mL/min (A) (by C-G formula based on SCr of 7.43 mg/dL (H)). Liver Function Tests: Recent Labs  Lab 02/27/21 1901 03/02/21 0223  AST 29  --   ALT 18  --   ALKPHOS 67  --   BILITOT 1.3*  --   PROT 6.9  --   ALBUMIN 3.6 3.1*   No results for input(s): LIPASE, AMYLASE in the last 168 hours. No results for input(s): AMMONIA in the last 168 hours. Coagulation Profile: No results for input(s): INR, PROTIME in the last 168 hours. Cardiac Enzymes: No results for input(s): CKTOTAL, CKMB, CKMBINDEX, TROPONINI in the last 168 hours. BNP (last 3 results) No results for input(s): PROBNP in the last 8760 hours. HbA1C: No results for input(s): HGBA1C in the last 72 hours. CBG: Recent Labs  Lab 02/27/21 1901  GLUCAP 112*   Lipid Profile: No results for input(s): CHOL, HDL, LDLCALC, TRIG, CHOLHDL, LDLDIRECT in the last 72 hours. Thyroid Function Tests: No results for input(s): TSH, T4TOTAL, FREET4, T3FREE, THYROIDAB in the last 72 hours. Anemia Panel: Recent Labs    03/03/21 1341  FERRITIN 694*  TIBC 273  IRON 60   Sepsis Labs: No results for input(s): PROCALCITON, LATICACIDVEN in the last 168 hours.  Recent Results (from the past 240 hour(s))  Resp Panel by RT-PCR (Flu A&B, Covid) Nasopharyngeal Swab     Status: None   Collection Time: 03/01/21  8:07 AM   Specimen: Nasopharyngeal Swab;  Nasopharyngeal(NP) swabs in vial transport medium  Result Value Ref Range Status   SARS Coronavirus 2 by RT PCR NEGATIVE NEGATIVE Final    Comment: (NOTE) SARS-CoV-2 target nucleic acids are NOT DETECTED.  The SARS-CoV-2 RNA is generally detectable in upper respiratory specimens during the acute phase of infection. The lowest concentration of SARS-CoV-2 viral copies this assay can detect is 138 copies/mL. A negative result does not preclude SARS-Cov-2 infection and should not be used as the sole basis for treatment or other patient management decisions. A negative result may occur with  improper specimen collection/handling, submission of specimen other than nasopharyngeal swab, presence of viral mutation(s) within the areas targeted by this assay, and inadequate number of viral copies(<138 copies/mL). A negative result must be combined with clinical observations, patient history, and epidemiological information. The expected result is Negative.  Fact Sheet for Patients:  EntrepreneurPulse.com.au  Fact Sheet for Healthcare Providers:  IncredibleEmployment.be  This test is no t yet approved or cleared by the [place] FDA and  has been authorized for detection and/or diagnosis of SARS-CoV-2 by FDA under an Emergency Use Authorization (EUA). This EUA will remain  in effect (meaning this test can be used) for the duration of the COVID-19 declaration under Section 564(b)(1) of the Act, 21 U.S.C.section 360bbb-3(b)(1), unless the authorization is terminated  or revoked sooner.       Influenza A by PCR NEGATIVE NEGATIVE Final   Influenza B by PCR NEGATIVE NEGATIVE Final    Comment: (NOTE) The Xpert Xpress SARS-CoV-2/FLU/RSV plus assay is intended as an aid in the diagnosis of influenza from Nasopharyngeal swab specimens and should not be used as a sole basis for treatment. Nasal washings and aspirates are unacceptable for Xpert Xpress  SARS-CoV-2/FLU/RSV testing.  Fact Sheet for Patients: EntrepreneurPulse.com.au  Fact Sheet for Healthcare Providers: IncredibleEmployment.be  This test is not yet approved or cleared by the [place] FDA and has been authorized for detection and/or diagnosis of SARS-CoV-2 by FDA under an Emergency Use Authorization (EUA). This EUA will remain in effect (meaning this test can be used) for the duration of the COVID-19 declaration under Section 564(b)(1) of the Act, 21 U.S.C. section 360bbb-3(b)(1), unless the authorization is terminated or revoked.  Performed at [facility], [place] [address], [place],  [address]          Radiology Studies: No results found.      Scheduled Meds:  (feeding supplement) PROSource Plus  30 mL Oral TID BM   amiodarone  200 mg Oral Daily   amLODipine  10 mg Oral Daily   atorvastatin  80 mg Oral Q supper   Chlorhexidine Gluconate Cloth  6 each Topical Q0600   clopidogrel  75 mg Oral Q supper   darbepoetin (ARANESP) injection - DIALYSIS  100 mcg Intravenous Q Fri-HD   heparin  5,000 Units Subcutaneous Q12H   lanthanum  2,000 mg Oral TID WC   pantoprazole  40 mg Oral Daily   vitamin B-12  1,000 mcg Oral QPM   Continuous Infusions:        Jose August, MD [facility] 03/04/2021, 11:11 AM

## 2021-03-05 DIAGNOSIS — G9520 Unspecified cord compression: Secondary | ICD-10-CM | POA: Diagnosis not present

## 2021-03-05 MED ORDER — DARBEPOETIN ALFA 100 MCG/0.5ML IJ SOSY
100.0000 ug | PREFILLED_SYRINGE | INTRAMUSCULAR | Status: DC
  Administered 2021-03-06 – 2021-03-13 (×2): 100 ug via INTRAVENOUS
  Filled 2021-03-05 (×4): qty 0.5

## 2021-03-05 NOTE — Progress Notes (Signed)
Patient ID: Jose Garrett., male   DOB: 09/21/1950, 70 y.o.   MRN: [id number]  PROGRESS NOTE    [name]  ALP:[id number] DOB: Feb 15, 1951 DOA: 02/27/2021 PCP: [name], NP   Brief Narrative:  70 year old male with amyloidosis, chronic combined CHF, ESRD, CAD, peripheral vascular disease status post right BKA, severe AS status post TAVR presented to the hospital with increased weakness of bilateral arms, hands, fingers as well as wheelchair-bound for the past 3 weeks.  Patient has been unable to lift his arms above the shoulder which is completely new.  Imaging in the ED showed subacute spinal cord compression.  Neurosurgery and nephrology were consulted.  Assessment & Plan:   Subacute C-spine cord compression -MRI on admission showed advanced degenerative changes resulting in severe spinal canal stenosis with cord compression from C3-4 through C5-6.   -Neurosurgery following: Patient wants to proceed with surgery.  DC Plavix today.   Chronic combined CHF -most recent 2D echo in August 2021 showed an EF of 45-50%, mild decreased LV function with global hypokinesis.  There was also grade 1 diastolic dysfunction.  RV was mildly reduced as well.   -Currently appears euvolemic.  -Volume managed by dialysis.   History of severe aortic stenosis-status post TAVR by Dr. Burt Knack in 2020.   Peripheral vascular disease -seen by vascular surgery as an outpatient, status post BKA on the right.  He has a femoropopliteal bypass graft on the left   Coronary artery disease -most recent cath was done in May 2020 which showed severe single-vessel CAD with chronic total occlusion of the RCA, with collaterals.  Mild calcific nonobstructive LCA disease.   -Continue statin. No chest pain.  Plavix will need to be held for at least 5 days prior to surgery.   HTN, uncontrolled  -Blood pressure intermittently on the higher side.  Continue amlodipine.   ESRD on HD -Continue scheduled HD per  nephrology    Chronic anemia secondary to ESRD -Hemoglobin currently stable.  Monitor intermittently.  Thrombocytopenia -Questionable cause.  No signs of bleeding.  Monitor intermittently.    DVT prophylaxis: Heparin Code Status: Full Family Communication: None at bedside Disposition Plan: Status is: Inpatient  Remains inpatient appropriate because: Of severity of illness  Consultants: Neurosurgery/nephrology  Procedures: None  Antimicrobials: None   Subjective: Patient seen and examined at bedside.  Complains of intermittent back pain.  No overnight fever, vomiting, worsening shortness of breath reported. Objective: Vitals:   03/04/21 1934 03/05/21 0002 03/05/21 0405 03/05/21 0733  BP: (!) 141/44 (!) 158/54 (!) 157/46 (!) 169/49  Pulse: 67 66 62 60  Resp: 17 17 17 16   Temp: 97.7 F (36.5 C) 98.4 F (36.9 C) 98.1 F (36.7 C) 98 F (36.7 C)  TempSrc: Oral Oral Oral Oral  SpO2: 98% 93% 95% 96%  Weight:      Height:       No intake or output data in the 24 hours ending 03/05/21 0805  Filed Weights   03/01/21 1636 03/03/21 0824 03/03/21 1207  Weight: 73.7 kg 74 kg 71.6 kg    Examination:  General exam: No distress.  On room air currently.  Looks chronically ill and deconditioned.   Respiratory system: Decreased breath sounds at bases bilaterally; no wheezing currently  cardiovascular system: Currently rate controlled; S1-S2 heard gastrointestinal system: Abdomen is distended slightly; soft and nontender.  Bowel sounds are heard Extremities: Right BKA present; mild left lower extremity edema Central nervous system: Awake and alert.  Still slow to respond.  No focal neurological deficits.  Moves extremities  skin: No obvious ecchymosis/rashes  psychiatry: Flat affect  Data Reviewed: I have personally reviewed following labs and imaging studies  CBC: Recent Labs  Lab 02/27/21 1901 03/02/21 0223 03/03/21 0149  WBC 6.1 3.6* 4.1  NEUTROABS  --  2.2  --    HGB 9.2* 8.4* 8.7*  HCT 28.2* 26.6* 28.0*  MCV 97.2 97.8 97.2  PLT 128* 103* 115*    Basic Metabolic Panel: Recent Labs  Lab 02/27/21 1901 03/01/21 0302 03/02/21 0223 03/03/21 0149  NA 137 135 133* 137  K 3.9 4.4 3.8 4.1  CL 94* 95* 96* 99  CO2 29 26 27 29   GLUCOSE 84 96 98 115*  BUN 23 46* 22 33*  CREATININE 5.12* 8.45* 5.31* 7.43*  CALCIUM 9.1 9.4 8.7* 9.2  MG 2.1  --   --   --   PHOS  --   --  5.2*  --     GFR: Estimated Creatinine Clearance: 9.6 mL/min (A) (by C-G formula based on SCr of 7.43 mg/dL (H)). Liver Function Tests: Recent Labs  Lab 02/27/21 1901 03/02/21 0223  AST 29  --   ALT 18  --   ALKPHOS 67  --   BILITOT 1.3*  --   PROT 6.9  --   ALBUMIN 3.6 3.1*    No results for input(s): LIPASE, AMYLASE in the last 168 hours. No results for input(s): AMMONIA in the last 168 hours. Coagulation Profile: No results for input(s): INR, PROTIME in the last 168 hours. Cardiac Enzymes: No results for input(s): CKTOTAL, CKMB, CKMBINDEX, TROPONINI in the last 168 hours. BNP (last 3 results) No results for input(s): PROBNP in the last 8760 hours. HbA1C: No results for input(s): HGBA1C in the last 72 hours. CBG: Recent Labs  Lab 02/27/21 1901  GLUCAP 112*    Lipid Profile: No results for input(s): CHOL, HDL, LDLCALC, TRIG, CHOLHDL, LDLDIRECT in the last 72 hours. Thyroid Function Tests: No results for input(s): TSH, T4TOTAL, FREET4, T3FREE, THYROIDAB in the last 72 hours. Anemia Panel: Recent Labs    03/03/21 1341  FERRITIN 694*  TIBC 273  IRON 60    Sepsis Labs: No results for input(s): PROCALCITON, LATICACIDVEN in the last 168 hours.  Recent Results (from the past 240 hour(s))  Resp Panel by RT-PCR (Flu A&B, Covid) Nasopharyngeal Swab     Status: None   Collection Time: 03/01/21  8:07 AM   Specimen: Nasopharyngeal Swab; Nasopharyngeal(NP) swabs in vial transport medium  Result Value Ref Range Status   SARS Coronavirus 2 by RT PCR NEGATIVE  NEGATIVE Final    Comment: (NOTE) SARS-CoV-2 target nucleic acids are NOT DETECTED.  The SARS-CoV-2 RNA is generally detectable in upper respiratory specimens during the acute phase of infection. The lowest concentration of SARS-CoV-2 viral copies this assay can detect is 138 copies/mL. A negative result does not preclude SARS-Cov-2 infection and should not be used as the sole basis for treatment or other patient management decisions. A negative result may occur with  improper specimen collection/handling, submission of specimen other than nasopharyngeal swab, presence of viral mutation(s) within the areas targeted by this assay, and inadequate number of viral copies(<138 copies/mL). A negative result must be combined with clinical observations, patient history, and epidemiological information. The expected result is Negative.  Fact Sheet for Patients:  EntrepreneurPulse.com.au  Fact Sheet for Healthcare Providers:  IncredibleEmployment.be  This test is no t yet approved or cleared  by the [place] and  has been authorized for detection and/or diagnosis of SARS-CoV-2 by FDA under an Emergency Use Authorization (EUA). This EUA will remain  in effect (meaning this test can be used) for the duration of the COVID-19 declaration under Section 564(b)(1) of the Act, 21 U.S.C.section 360bbb-3(b)(1), unless the authorization is terminated  or revoked sooner.       Influenza A by PCR NEGATIVE NEGATIVE Final   Influenza B by PCR NEGATIVE NEGATIVE Final    Comment: (NOTE) The Xpert Xpress SARS-CoV-2/FLU/RSV plus assay is intended as an aid in the diagnosis of influenza from Nasopharyngeal swab specimens and should not be used as a sole basis for treatment. Nasal washings and aspirates are unacceptable for Xpert Xpress SARS-CoV-2/FLU/RSV testing.  Fact Sheet for Patients: EntrepreneurPulse.com.au  Fact Sheet for Healthcare  Providers: IncredibleEmployment.be  This test is not yet approved or cleared by the [place] FDA and has been authorized for detection and/or diagnosis of SARS-CoV-2 by FDA under an Emergency Use Authorization (EUA). This EUA will remain in effect (meaning this test can be used) for the duration of the COVID-19 declaration under Section 564(b)(1) of the Act, 21 U.S.C. section 360bbb-3(b)(1), unless the authorization is terminated or revoked.  Performed at [facility], [place] [address] [place], [place] [address]           Radiology Studies: No results found.      Scheduled Meds:  (feeding supplement) PROSource Plus  30 mL Oral TID BM   amiodarone  200 mg Oral Daily   amLODipine  10 mg Oral Daily   atorvastatin  80 mg Oral Q supper   Chlorhexidine Gluconate Cloth  6 each Topical Q0600   clopidogrel  75 mg Oral Q supper   [START ON 03/06/2021] darbepoetin (ARANESP) injection - DIALYSIS  100 mcg Subcutaneous Q Mon-HD   heparin  5,000 Units Subcutaneous Q12H   lanthanum  2,000 mg Oral TID WC   pantoprazole  40 mg Oral Daily   vitamin B-12  1,000 mcg Oral QPM   Continuous Infusions:        Aline August, MD [facility] 03/05/2021, 8:05 AM

## 2021-03-05 NOTE — Progress Notes (Signed)
Patient ID: Jose Garrett., male   DOB: 1950-09-21, 70 y.o.   MRN: [id number] S: no acute events, no new complaints, had some GI upset yesterday which he reports has improved this AM. O:BP (!) 169/49 (BP Location: Left Arm)    Pulse 60    Temp 98 F (36.7 C) (Oral)    Resp 16    Ht 5\' 10"  (1.778 m)    Wt 71.6 kg    SpO2 96%    BMI 22.65 kg/m  No intake or output data in the 24 hours ending 03/05/21 0758  Intake/Output: I/O last 3 completed shifts: In: 120 [P.O.:120] Out: -   Intake/Output this shift:  No intake/output data recorded. Weight change:  Gen: NAD, laying flat in bed CVS: RRR Resp:CTA Abd: +BS, soft, NT/nd Ext: rt bka, no edema Neuro: awake, alert Dialysis access: R AVF +T/B  Recent Labs  Lab 02/27/21 1901 03/01/21 0302 03/02/21 0223 03/03/21 0149  NA 137 135 133* 137  K 3.9 4.4 3.8 4.1  CL 94* 95* 96* 99  CO2 29 26 27 29   GLUCOSE 84 96 98 115*  BUN 23 46* 22 33*  CREATININE 5.12* 8.45* 5.31* 7.43*  ALBUMIN 3.6  --  3.1*  --   CALCIUM 9.1 9.4 8.7* 9.2  PHOS  --   --  5.2*  --   AST 29  --   --   --   ALT 18  --   --   --    Liver Function Tests: Recent Labs  Lab 02/27/21 1901 03/02/21 0223  AST 29  --   ALT 18  --   ALKPHOS 67  --   BILITOT 1.3*  --   PROT 6.9  --   ALBUMIN 3.6 3.1*   No results for input(s): LIPASE, AMYLASE in the last 168 hours. No results for input(s): AMMONIA in the last 168 hours. CBC: Recent Labs  Lab 02/27/21 1901 03/02/21 0223 03/03/21 0149  WBC 6.1 3.6* 4.1  NEUTROABS  --  2.2  --   HGB 9.2* 8.4* 8.7*  HCT 28.2* 26.6* 28.0*  MCV 97.2 97.8 97.2  PLT 128* 103* 115*   Cardiac Enzymes: No results for input(s): CKTOTAL, CKMB, CKMBINDEX, TROPONINI in the last 168 hours. CBG: Recent Labs  Lab 02/27/21 1901  GLUCAP 112*    Iron Studies:  Recent Labs    03/03/21 1341  IRON 60  TIBC 273  FERRITIN 694*   Studies/Results: No results found.  (feeding supplement) PROSource Plus  30 mL Oral TID BM    amiodarone  200 mg Oral Daily   amLODipine  10 mg Oral Daily   atorvastatin  80 mg Oral Q supper   Chlorhexidine Gluconate Cloth  6 each Topical Q0600   clopidogrel  75 mg Oral Q supper   [START ON 03/06/2021] darbepoetin (ARANESP) injection - DIALYSIS  100 mcg Subcutaneous Q Mon-HD   heparin  5,000 Units Subcutaneous Q12H   lanthanum  2,000 mg Oral TID WC   pantoprazole  40 mg Oral Daily   vitamin B-12  1,000 mcg Oral QPM    BMET    Component Value Date/Time   NA 137 03/03/2021 0149   NA 135 07/14/2018 0909   NA 141 03/14/2017 0921   K 4.1 03/03/2021 0149   K 4.1 03/14/2017 0921   CL 99 03/03/2021 0149   CO2 29 03/03/2021 0149   CO2 33 (H) 03/14/2017 0921   GLUCOSE 115 (H) 03/03/2021 0149  GLUCOSE 144 (H) 03/14/2017 0921   BUN 33 (H) 03/03/2021 0149   BUN 76 (HH) 07/14/2018 0909   BUN 30.1 (H) 03/14/2017 0921   CREATININE 7.43 (H) 03/03/2021 0149   CREATININE 9.16 (HH) 09/17/2017 0954   CREATININE 8.1 (HH) 03/14/2017 0921   CALCIUM 9.2 03/03/2021 0149   CALCIUM 9.5 03/14/2017 0921   GFRNONAA 7 (L) 03/03/2021 0149   GFRNONAA 5 (L) 09/17/2017 0954   GFRNONAA 24 (L) 06/30/2014 1528   GFRAA 8 (L) 05/27/2019 1457   GFRAA 6 (L) 09/17/2017 0954   GFRAA 28 (L) 06/30/2014 1528   CBC    Component Value Date/Time   WBC 4.1 03/03/2021 0149   RBC 2.88 (L) 03/03/2021 0149   HGB 8.7 (L) 03/03/2021 0149   HGB 11.4 (L) 11/12/2018 1637   HGB 10.8 (L) 03/14/2017 0921   HCT 28.0 (L) 03/03/2021 0149   HCT 35.0 (L) 11/12/2018 1637   HCT 32.5 (L) 03/14/2017 0921   PLT 115 (L) 03/03/2021 0149   PLT 51 (LL) 11/12/2018 1637   MCV 97.2 03/03/2021 0149   MCV 103 (H) 11/12/2018 1637   MCV 106.2 (H) 03/14/2017 0921   MCH 30.2 03/03/2021 0149   MCHC 31.1 03/03/2021 0149   RDW 15.2 03/03/2021 0149   RDW 14.6 11/12/2018 1637   RDW 14.6 03/14/2017 0921   LYMPHSABS 0.9 03/02/2021 0223   LYMPHSABS 1.0 11/12/2018 1637   LYMPHSABS 0.8 (L) 03/14/2017 0921   MONOABS 0.4 03/02/2021 0223    MONOABS 0.6 03/14/2017 0921   EOSABS 0.1 03/02/2021 0223   EOSABS 0.2 11/12/2018 1637   BASOSABS 0.0 03/02/2021 0223   BASOSABS 0.0 11/12/2018 1637   BASOSABS 0.0 03/14/2017 0921    Dialysis Orders:  MWF- [facility] 4hrs, BFR 400, DFR Auto 1.5,  Access:  RAVF  EDW 71.9kg, 2K/ 2Ca Heparin 2400 unit bolus Mircera 75 mcg q4wks - last 02/15/21 Calcitriol 49mcg PO qHD-last 02/27/21 Home meds:     Assessment/Plan: Spinal Cord Compression-imaging shows advanced degenerative changes and spinal canal stenosis with cord compression. Neurosurgery consulted. Continue supportive care and pain management per primary.  High risk for surgery, patient is discussing with his family about next steps ESRD - On HD per usual MWF schedule. Next HD 03/06/21. Hypertension/volume-Bps varaible; monitor trend; continue on Amlodipine and Hydralazine. Relatively euvolemic on exam Anemia of CKD - Hgb down to 8.7 12/23, resumed esa here-first dose 12/23 Secondary Hyperparathyroidism - on lanthanum Nutrition - Renal diet with fluid restriction; protein supplements  Gean Quint, MD [facility]

## 2021-03-06 DIAGNOSIS — G9520 Unspecified cord compression: Secondary | ICD-10-CM | POA: Diagnosis not present

## 2021-03-06 LAB — RENAL FUNCTION PANEL
Albumin: 3.4 g/dL — ABNORMAL LOW (ref 3.5–5.0)
Anion gap: 14 (ref 5–15)
BUN: 51 mg/dL — ABNORMAL HIGH (ref 8–23)
CO2: 25 mmol/L (ref 22–32)
Calcium: 9.5 mg/dL (ref 8.9–10.3)
Chloride: 97 mmol/L — ABNORMAL LOW (ref 98–111)
Creatinine, Ser: 9.9 mg/dL — ABNORMAL HIGH (ref 0.61–1.24)
GFR, Estimated: 5 mL/min — ABNORMAL LOW (ref >60)
Glucose, Bld: 109 mg/dL — ABNORMAL HIGH (ref 70–99)
Phosphorus: 7.7 mg/dL — ABNORMAL HIGH (ref 2.5–4.6)
Potassium: 4.2 mmol/L (ref 3.5–5.1)
Sodium: 136 mmol/L (ref 135–145)

## 2021-03-06 LAB — CBC WITH DIFFERENTIAL/PLATELET
Abs Immature Granulocytes: 0.02 10*3/uL (ref 0.00–0.07)
Basophils Absolute: 0 10*3/uL (ref 0.0–0.1)
Basophils Relative: 0 %
Eosinophils Absolute: 0.2 10*3/uL (ref 0.0–0.5)
Eosinophils Relative: 3 %
HCT: 29 % — ABNORMAL LOW (ref 39.0–52.0)
Hemoglobin: 9.2 g/dL — ABNORMAL LOW (ref 13.0–17.0)
Immature Granulocytes: 0 %
Lymphocytes Relative: 22 %
Lymphs Abs: 1.1 10*3/uL (ref 0.7–4.0)
MCH: 30.4 pg (ref 26.0–34.0)
MCHC: 31.7 g/dL (ref 30.0–36.0)
MCV: 95.7 fL (ref 80.0–100.0)
Monocytes Absolute: 0.4 10*3/uL (ref 0.1–1.0)
Monocytes Relative: 8 %
Neutro Abs: 3.5 10*3/uL (ref 1.7–7.7)
Neutrophils Relative %: 67 %
Platelets: 132 10*3/uL — ABNORMAL LOW (ref 150–400)
RBC: 3.03 MIL/uL — ABNORMAL LOW (ref 4.22–5.81)
RDW: 15.1 % (ref 11.5–15.5)
WBC: 5.2 10*3/uL (ref 4.0–10.5)
nRBC: 0 % (ref 0.0–0.2)

## 2021-03-06 NOTE — Plan of Care (Signed)
°  Problem: Education: Goal: Knowledge of General Education information will improve Description: Including pain rating scale, medication(s)/side effects and non-pharmacologic comfort measures Outcome: Progressing   Problem: Health Behavior/Discharge Planning: Goal: Ability to manage health-related needs will improve Outcome: Progressing   Problem: Clinical Measurements: Goal: Ability to maintain clinical measurements within normal limits will improve Outcome: Progressing   Problem: Activity: Goal: Risk for activity intolerance will decrease Outcome: Progressing   Problem: Nutrition: Goal: Adequate nutrition will be maintained Outcome: Progressing   Problem: Pain Managment: Goal: General experience of comfort will improve Outcome: Progressing   Problem: Activity: Goal: Ability to perform activities at highest level will improve Outcome: Progressing   Problem: Bowel/Gastric: Goal: Ability to demonstrate the techniques of an individualized bowel program will improve Outcome: Progressing   Problem: Education: Goal: Knowledge of disease or condition will improve Outcome: Progressing   Problem: Coping: Goal: Ability to identify and develop effective coping behavior will improve Outcome: Progressing

## 2021-03-06 NOTE — Progress Notes (Signed)
Physical Therapy Treatment Patient Details Name: Jose Garrett. MRN: [id number] DOB: [birth year] Today's Date: 03/06/2021   History of Present Illness 70 y.o. m admitted on 02/27/21 due to increased weakness of BUE weakness and has become WC bound in the past 3 weeks due to weakness. MRI shows subacute c-spine cord compression, which he is not a surgical candidate for. PMH significant for amyloidosis, chronic combined CHF, ESRD, CAD, peripheral vascular disease status post right BKA, severe AS status post TAVR.    PT Comments    Pt resting in bed upon PT arrival, complaining of back pain and wanting to sit EOB. Pt requiring mod assist for transfer to/from EOB, log roll technique utilized given cord compression and back pain. Pt with little tolerance for EOB sitting, and declines OOB transfer attempt due to back spasms and fatigue from HD. PT to continue to follow.     Recommendations for follow up therapy are one component of a multi-disciplinary discharge planning process, led by the attending physician.  Recommendations may be updated based on patient status, additional functional criteria and insurance authorization.  Follow Up Recommendations  Skilled nursing-short term rehab (<3 hours/day)     Assistance Recommended at Discharge Frequent or constant Supervision/Assistance  Equipment Recommendations  None recommended by PT    Recommendations for Other Services       Precautions / Restrictions Precautions Precautions: Fall Restrictions Weight Bearing Restrictions: No     Mobility  Bed Mobility Overal bed mobility: Needs Assistance Bed Mobility: Rolling;Sidelying to Sit;Sit to Sidelying Rolling: Min assist Sidelying to sit: Mod assist     Sit to sidelying: Mod assist General bed mobility comments: assist for trunk and LE management, scooting to/from EOB, boost up in bed. Supine<>sit x2, sat on both sides of bed in preparation for transfer to recliner, then declined OOB  transfer.    Transfers                   General transfer comment: bedlevel per pt request - states "not today" for transfer OOB    Ambulation/Gait                   Stairs             Wheelchair Mobility    Modified Rankin (Stroke Patients Only)       Balance Overall balance assessment: Needs assistance Sitting-balance support: Feet supported;Single extremity supported Sitting balance-Leahy Scale: Fair   Postural control: Posterior lean                                  Cognition Arousal/Alertness: Awake/alert Behavior During Therapy: WFL for tasks assessed/performed Overall Cognitive Status: Within Functional Limits for tasks assessed                                          Exercises      General Comments        Pertinent Vitals/Pain Pain Assessment: Faces Faces Pain Scale: Hurts little more Pain Location: back Pain Descriptors / Indicators: Aching;Sore;Spasm Pain Intervention(s): Limited activity within patient's tolerance;Monitored during session;Repositioned    Home Living                          Prior Function  PT Goals (current goals can now be found in the care plan section) Acute Rehab PT Goals Patient Stated Goal: agreeable to SNF PT Goal Formulation: With patient Time For Goal Achievement: 03/14/21 Potential to Achieve Goals: Fair Progress towards PT goals: Progressing toward goals    Frequency    Min 2X/week      PT Plan Current plan remains appropriate    Co-evaluation              AM-PAC PT "6 Clicks" Mobility   Outcome Measure  Help needed turning from your back to your side while in a flat bed without using bedrails?: A Little Help needed moving from lying on your back to sitting on the side of a flat bed without using bedrails?: A Lot Help needed moving to and from a bed to a chair (including a wheelchair)?: A Lot Help needed standing up  from a chair using your arms (e.g., wheelchair or bedside chair)?: Total Help needed to walk in hospital room?: Total Help needed climbing 3-5 steps with a railing? : Total 6 Click Score: 10    End of Session Equipment Utilized During Treatment: Gait belt Activity Tolerance: Patient tolerated treatment well Patient left: with call bell/phone within reach;in bed;with bed alarm set Nurse Communication: Mobility status PT Visit Diagnosis: Other abnormalities of gait and mobility (R26.89);Muscle weakness (generalized) (M62.81)     Time: 8159-4707 PT Time Calculation (min) (ACUTE ONLY): 15 min  Charges:  $Therapeutic Activity: 8-22 mins                    Stacie Glaze, PT DPT Acute Rehabilitation Services Pager [phone]  Office [phone]    [name] 03/06/2021, 4:16 PM

## 2021-03-06 NOTE — Progress Notes (Signed)
Patient ID: Jose Garrett., male   DOB: 1950-11-19, 70 y.o.   MRN: [id number] S: no acute events, no c/o's. Seen in HD  O:BP 139/87    Pulse 69    Temp (!) 97.1 F (36.2 C)    Resp 13    Ht 5\' 10"  (1.778 m)    Wt 73.4 kg    SpO2 99%    BMI 23.22 kg/m   Intake/Output Summary (Last 24 hours) at 03/06/2021 1154 Last data filed at 03/05/2021 1800 Gross per 24 hour  Intake 480 ml  Output 0 ml  Net 480 ml    Intake/Output: I/O last 3 completed shifts: In: 800 [P.O.:800] Out: 0   Intake/Output this shift:  No intake/output data recorded. Weight change:  Gen: NAD, laying flat in bed CVS: RRR Resp:CTA Abd: +BS, soft, NT/nd Ext: rt bka, no edema Neuro: awake, alert Dialysis access: R AVF +T/B  Dialysis Orders:  MWF- [facility] 4hrs, BFR 400, DFR Auto 1.5,  Access:  RAVF  EDW 71.9kg, 2K/ 2Ca Heparin 2400 unit bolus Mircera 75 mcg q4wks - last 02/15/21 Calcitriol 25mcg PO qHD-last 02/27/21 Home meds:     Assessment/Plan: Spinal Cord Compression-imaging shows advanced degenerative changes and spinal canal stenosis with cord compression. Neurosurgery consulted. Continue supportive care and pain management per primary.  High risk for surgery, patient is discussing with his family about next steps.  ESRD - On HD per usual MWF schedule. HD today.  Hypertension/volume-Bps varaible; monitor trend; continue on Amlodipine and Hydralazine. Euvolemic on exam Anemia of CKD - Hgb down to 8.7 12/23, resumed esa here-first dose 12/23 Secondary Hyperparathyroidism - on lanthanum Nutrition - Renal diet with fluid restriction; protein supplements  [name], MD 03/06/2021, 11:55 AM        CBC: Recent Labs  Lab 02/27/21 1901 03/02/21 0223 03/03/21 0149 03/06/21 0935  WBC 6.1 3.6* 4.1 5.2  NEUTROABS  --  2.2  --  3.5  HGB 9.2* 8.4* 8.7* 9.2*  HCT 28.2* 26.6* 28.0* 29.0*  MCV 97.2 97.8 97.2 95.7  PLT 128* 103* 115* 132*     Iron Studies:  Recent Labs     03/03/21 1341  IRON 60  TIBC 273  FERRITIN 694*    Studies/Results: No results found.  (feeding supplement) PROSource Plus  30 mL Oral TID BM   amiodarone  200 mg Oral Daily   amLODipine  10 mg Oral Daily   atorvastatin  80 mg Oral Q supper   Chlorhexidine Gluconate Cloth  6 each Topical Q0600   darbepoetin (ARANESP) injection - DIALYSIS  100 mcg Intravenous Q Mon-HD   heparin  5,000 Units Subcutaneous Q12H   lanthanum  2,000 mg Oral TID WC   pantoprazole  40 mg Oral Daily   vitamin B-12  1,000 mcg Oral QPM

## 2021-03-06 NOTE — Progress Notes (Signed)
PT Cancellation Note  Patient Details Name: Jose Garrett. MRN: [id number] DOB: May 19, 1950   Cancelled Treatment:    Reason Eval/Treat Not Completed: Patient at procedure or test/unavailable - HD, will check back as schedule allows.   Stacie Glaze, PT DPT Acute Rehabilitation Services Pager [phone]  Office [phone]    Louis Matte 03/06/2021, 10:11 AM

## 2021-03-06 NOTE — Progress Notes (Addendum)
Patient refusing to take medications. He keeps saying "may be later". He would like me to leave the medication with him to take later but I found left over pills while making his bed earlier and I have explained to him that its not safe practice to do so.MD made aware.

## 2021-03-06 NOTE — Progress Notes (Signed)
Patient ID: Jose Freel., male   DOB: [birth year], 70 y.o.   MRN: [id number]  PROGRESS NOTE    Jose Cramp.  HQI:[id number] DOB: [birth year] DOA: 02/27/2021 PCP: [name], NP   Brief Narrative:  70 year old male with amyloidosis, chronic combined CHF, ESRD, CAD, peripheral vascular disease status post right BKA, severe AS status post TAVR presented to the hospital with increased weakness of bilateral arms, hands, fingers as well as wheelchair-bound for the past 3 weeks.  Patient has been unable to lift his arms above the shoulder which is completely new.  Imaging in the ED showed subacute spinal cord compression.  Neurosurgery and nephrology were consulted.  Assessment & Plan:   Subacute C-spine cord compression -MRI on admission showed advanced degenerative changes resulting in severe spinal canal stenosis with cord compression from C3-4 through C5-6.   -Neurosurgery following: Patient wants to proceed with surgery.  DC'd Plavix on 03/05/2021.   Chronic combined CHF -most recent 2D echo in August 2021 showed an EF of 45-50%, mild decreased LV function with global hypokinesis.  There was also grade 1 diastolic dysfunction.  RV was mildly reduced as well.   -Currently appears euvolemic.  -Volume managed by dialysis.   History of severe aortic stenosis-status post TAVR by Dr. Burt Knack in 2020.   Peripheral vascular disease -seen by vascular surgery as an outpatient, status post BKA on the right.  He has a femoropopliteal bypass graft on the left   Coronary artery disease -most recent cath was done in May 2020 which showed severe single-vessel CAD with chronic total occlusion of the RCA, with collaterals.  Mild calcific nonobstructive LCA disease.   -Continue statin. No chest pain.  Plavix held on 03/05/2021.  HTN, uncontrolled  -Blood pressure intermittently on the higher side.  Continue amlodipine.   ESRD on HD -Continue scheduled HD per nephrology    Chronic anemia  secondary to ESRD -Hemoglobin currently stable.  Monitor intermittently.  Thrombocytopenia -Questionable cause.  No signs of bleeding.  Monitor intermittently.    DVT prophylaxis: Heparin Code Status: Full Family Communication: None at bedside Disposition Plan: Status is: Inpatient  Remains inpatient appropriate because: Of severity of illness  Consultants: Neurosurgery/nephrology  Procedures: None  Antimicrobials: None   Subjective: Patient seen and examined at bedside undergoing hemodialysis.  Denies worsening shortness of breath, nausea, vomiting, abdominal pain or fever.   Objective: Vitals:   03/05/21 2235 03/06/21 0006 03/06/21 0009 03/06/21 0435  BP: (!) 150/85 (!) 164/53 (!) 145/47 (!) 139/48  Pulse: 61 64 63 64  Resp:  18  18  Temp:  (!) 97.4 F (36.3 C)  97.6 F (36.4 C)  TempSrc:  Oral  Oral  SpO2:  93%  100%  Weight:      Height:        Intake/Output Summary (Last 24 hours) at 03/06/2021 0815 Last data filed at 03/05/2021 1800 Gross per 24 hour  Intake 800 ml  Output 0 ml  Net 800 ml    Filed Weights   03/01/21 1636 03/03/21 0824 03/03/21 1207  Weight: 73.7 kg 74 kg 71.6 kg    Examination:  General exam: On room air currently.  No acute distress.  Looks chronically ill and deconditioned.   Respiratory system: Bilateral decreased breath sounds at bases with scattered crackles  cardiovascular system: S1-S2 heard; rate controlled  gastrointestinal system: Abdomen is mildly distended; soft and nontender.  Normal bowel sounds heard  extremities: Right BKA present; no left lower extremity  cyanosis or clubbing  Central nervous system: Alert and oriented.  Slow to respond.  No focal neurological deficits.  Moving extremities skin: No obvious petechiae/lesions  psychiatry: Affect is flat  Data Reviewed: I have personally reviewed following labs and imaging studies  CBC: Recent Labs  Lab 02/27/21 1901 03/02/21 0223 03/03/21 0149  WBC 6.1 3.6*  4.1  NEUTROABS  --  2.2  --   HGB 9.2* 8.4* 8.7*  HCT 28.2* 26.6* 28.0*  MCV 97.2 97.8 97.2  PLT 128* 103* 115*    Basic Metabolic Panel: Recent Labs  Lab 02/27/21 1901 03/01/21 0302 03/02/21 0223 03/03/21 0149  NA 137 135 133* 137  K 3.9 4.4 3.8 4.1  CL 94* 95* 96* 99  CO2 29 26 27 29   GLUCOSE 84 96 98 115*  BUN 23 46* 22 33*  CREATININE 5.12* 8.45* 5.31* 7.43*  CALCIUM 9.1 9.4 8.7* 9.2  MG 2.1  --   --   --   PHOS  --   --  5.2*  --     GFR: Estimated Creatinine Clearance: 9.6 mL/min (A) (by C-G formula based on SCr of 7.43 mg/dL (H)). Liver Function Tests: Recent Labs  Lab 02/27/21 1901 03/02/21 0223  AST 29  --   ALT 18  --   ALKPHOS 67  --   BILITOT 1.3*  --   PROT 6.9  --   ALBUMIN 3.6 3.1*    No results for input(s): LIPASE, AMYLASE in the last 168 hours. No results for input(s): AMMONIA in the last 168 hours. Coagulation Profile: No results for input(s): INR, PROTIME in the last 168 hours. Cardiac Enzymes: No results for input(s): CKTOTAL, CKMB, CKMBINDEX, TROPONINI in the last 168 hours. BNP (last 3 results) No results for input(s): PROBNP in the last 8760 hours. HbA1C: No results for input(s): HGBA1C in the last 72 hours. CBG: Recent Labs  Lab 02/27/21 1901  GLUCAP 112*    Lipid Profile: No results for input(s): CHOL, HDL, LDLCALC, TRIG, CHOLHDL, LDLDIRECT in the last 72 hours. Thyroid Function Tests: No results for input(s): TSH, T4TOTAL, FREET4, T3FREE, THYROIDAB in the last 72 hours. Anemia Panel: Recent Labs    03/03/21 1341  FERRITIN 694*  TIBC 273  IRON 60    Sepsis Labs: No results for input(s): PROCALCITON, LATICACIDVEN in the last 168 hours.  Recent Results (from the past 240 hour(s))  Resp Panel by RT-PCR (Flu A&B, Covid) Nasopharyngeal Swab     Status: None   Collection Time: 03/01/21  8:07 AM   Specimen: Nasopharyngeal Swab; Nasopharyngeal(NP) swabs in vial transport medium  Result Value Ref Range Status   SARS  Coronavirus 2 by RT PCR NEGATIVE NEGATIVE Final    Comment: (NOTE) SARS-CoV-2 target nucleic acids are NOT DETECTED.  The SARS-CoV-2 RNA is generally detectable in upper respiratory specimens during the acute phase of infection. The lowest concentration of SARS-CoV-2 viral copies this assay can detect is 138 copies/mL. A negative result does not preclude SARS-Cov-2 infection and should not be used as the sole basis for treatment or other patient management decisions. A negative result may occur with  improper specimen collection/handling, submission of specimen other than nasopharyngeal swab, presence of viral mutation(s) within the areas targeted by this assay, and inadequate number of viral copies(<138 copies/mL). A negative result must be combined with clinical observations, patient history, and epidemiological information. The expected result is Negative.  Fact Sheet for Patients:  EntrepreneurPulse.com.au  Fact Sheet for Healthcare Providers:  IncredibleEmployment.be  This test is no t yet approved or cleared by the [place] and  has been authorized for detection and/or diagnosis of SARS-CoV-2 by FDA under an Emergency Use Authorization (EUA). This EUA will remain  in effect (meaning this test can be used) for the duration of the COVID-19 declaration under Section 564(b)(1) of the Act, 21 U.S.C.section 360bbb-3(b)(1), unless the authorization is terminated  or revoked sooner.       Influenza A by PCR NEGATIVE NEGATIVE Final   Influenza B by PCR NEGATIVE NEGATIVE Final    Comment: (NOTE) The Xpert Xpress SARS-CoV-2/FLU/RSV plus assay is intended as an aid in the diagnosis of influenza from Nasopharyngeal swab specimens and should not be used as a sole basis for treatment. Nasal washings and aspirates are unacceptable for Xpert Xpress SARS-CoV-2/FLU/RSV testing.  Fact Sheet for  Patients: EntrepreneurPulse.com.au  Fact Sheet for Healthcare Providers: IncredibleEmployment.be  This test is not yet approved or cleared by the [place] FDA and has been authorized for detection and/or diagnosis of SARS-CoV-2 by FDA under an Emergency Use Authorization (EUA). This EUA will remain in effect (meaning this test can be used) for the duration of the COVID-19 declaration under Section 564(b)(1) of the Act, 21 U.S.C. section 360bbb-3(b)(1), unless the authorization is terminated or revoked.  Performed at [facility], [place] [address], [place], [place] [address]           Radiology Studies: No results found.      Scheduled Meds:  (feeding supplement) PROSource Plus  30 mL Oral TID BM   amiodarone  200 mg Oral Daily   amLODipine  10 mg Oral Daily   atorvastatin  80 mg Oral Q supper   Chlorhexidine Gluconate Cloth  6 each Topical Q0600   darbepoetin (ARANESP) injection - DIALYSIS  100 mcg Intravenous Q Mon-HD   heparin  5,000 Units Subcutaneous Q12H   lanthanum  2,000 mg Oral TID WC   pantoprazole  40 mg Oral Daily   vitamin B-12  1,000 mcg Oral QPM   Continuous Infusions:        Aline August, MD [facility] 03/06/2021, 8:15 AM

## 2021-03-07 ENCOUNTER — Other Ambulatory Visit: Payer: Self-pay | Admitting: Neurosurgery

## 2021-03-07 ENCOUNTER — Ambulatory Visit: Payer: Medicare Other | Admitting: Occupational Therapy

## 2021-03-07 DIAGNOSIS — Z992 Dependence on renal dialysis: Secondary | ICD-10-CM | POA: Diagnosis not present

## 2021-03-07 DIAGNOSIS — G9520 Unspecified cord compression: Secondary | ICD-10-CM | POA: Diagnosis not present

## 2021-03-07 DIAGNOSIS — N186 End stage renal disease: Secondary | ICD-10-CM | POA: Diagnosis not present

## 2021-03-07 MED ORDER — CLOPIDOGREL BISULFATE 75 MG PO TABS
75.0000 mg | ORAL_TABLET | Freq: Every day | ORAL | Status: AC
  Administered 2021-03-07 – 2021-03-10 (×4): 75 mg via ORAL
  Filled 2021-03-07 (×4): qty 1

## 2021-03-07 NOTE — Plan of Care (Signed)
°  Problem: Education: Goal: Knowledge of General Education information will improve Description: Including pain rating scale, medication(s)/side effects and non-pharmacologic comfort measures Outcome: Progressing   Problem: Health Behavior/Discharge Planning: Goal: Ability to manage health-related needs will improve Outcome: Progressing   Problem: Clinical Measurements: Goal: Ability to maintain clinical measurements within normal limits will improve Outcome: Progressing Goal: Will remain free from infection Outcome: Progressing Goal: Diagnostic test results will improve Outcome: Progressing Goal: Respiratory complications will improve Outcome: Progressing Goal: Cardiovascular complication will be avoided Outcome: Progressing   Problem: Activity: Goal: Risk for activity intolerance will decrease Outcome: Progressing   Problem: Nutrition: Goal: Adequate nutrition will be maintained Outcome: Progressing   Problem: Coping: Goal: Level of anxiety will decrease Outcome: Progressing   Problem: Elimination: Goal: Will not experience complications related to bowel motility Outcome: Progressing Goal: Will not experience complications related to urinary retention Outcome: Progressing   Problem: Pain Managment: Goal: General experience of comfort will improve Outcome: Progressing   Problem: Safety: Goal: Ability to remain free from injury will improve Outcome: Progressing   Problem: Skin Integrity: Goal: Risk for impaired skin integrity will decrease Outcome: Progressing   Problem: Activity: Goal: Ability to perform activities at highest level will improve Outcome: Progressing Goal: Muscle strength will improve Outcome: Progressing   Problem: Bowel/Gastric: Goal: Ability to demonstrate the techniques of an individualized bowel program will improve Outcome: Progressing   Problem: Education: Goal: Knowledge of disease or condition will improve Outcome:  Progressing Goal: Knowledge of the prescribed therapeutic regimen will improve Outcome: Progressing   Problem: Coping: Goal: Ability to identify and develop effective coping behavior will improve Outcome: Progressing Goal: Ability to verbalize feelings will improve Outcome: Progressing   Problem: Self-Care: Goal: Ability to participate in self-care as condition permits will improve Outcome: Progressing   Problem: Skin Integrity: Goal: Risk for impaired skin integrity will decrease Outcome: Progressing   Problem: Urinary Elimination: Goal: Ability to achieve a regular elimination pattern will improve Outcome: Progressing

## 2021-03-07 NOTE — Progress Notes (Addendum)
Patient ID: Jose Garrett., male   DOB: April 16, 1950, 70 y.o.   MRN: [id number]  PROGRESS NOTE    [name]  AST:[id number] DOB: 31-May-1950 DOA: 02/27/2021 PCP: [name], NP   Brief Narrative:  70 year old male with amyloidosis, chronic combined CHF, ESRD, CAD, peripheral vascular disease status post right BKA, severe AS status post TAVR presented to the hospital with increased weakness of bilateral arms, hands, fingers as well as wheelchair-bound for the past 3 weeks.  Patient has been unable to lift his arms above the shoulder which is completely new.  Imaging in the ED showed subacute spinal cord compression.  Neurosurgery and nephrology were consulted.  Patient was initially undecided about surgery but now has decided to proceed with surgery.  Plavix discontinued on 03/05/2021.  Assessment & Plan:   Subacute C-spine cord compression -MRI on admission showed advanced degenerative changes resulting in severe spinal canal stenosis with cord compression from C3-4 through C5-6.   -Neurosurgery following: Patient wants to proceed with surgery.  DC'd Plavix on 03/05/2021.  It looks like surgery is tentatively planned for 03/17/2021.  Will resume Plavix today and hold from 03/11/2021 onwards. -Will eventually need SNF placement after surgery.   Chronic combined CHF -most recent 2D echo in August 2021 showed an EF of 45-50%, mild decreased LV function with global hypokinesis.  There was also grade 1 diastolic dysfunction.  RV was mildly reduced as well.   -Currently appears euvolemic.  -Volume managed by dialysis.   History of severe aortic stenosis-status post TAVR by Dr. Burt Knack in 2020.   Peripheral vascular disease -seen by vascular surgery as an outpatient, status post BKA on the right.  He has a femoropopliteal bypass graft on the left   Coronary artery disease -most recent cath was done in May 2020 which showed severe single-vessel CAD with chronic total occlusion of the RCA,  with collaterals.  Mild calcific nonobstructive LCA disease.   -Continue statin. No chest pain.  Plavix plan as above.  HTN, uncontrolled  -Blood pressure intermittently on the higher side.  Continue amlodipine.   ESRD on HD -Continue scheduled HD per nephrology    Chronic anemia secondary to ESRD -Hemoglobin currently stable.  Monitor intermittently.  Thrombocytopenia -Questionable cause.  No signs of bleeding.  Monitor intermittently.    DVT prophylaxis: Heparin Code Status: Full Family Communication: None at bedside Disposition Plan: Status is: Inpatient  Remains inpatient appropriate because: Of severity of illness.  Awaiting neurosurgical intervention.  Will eventually need SNF placement.  Consultants: Neurosurgery/nephrology  Procedures: None  Antimicrobials: None   Subjective: Patient seen and examined at bedside.  No fever, vomiting, worsening shortness of breath reported.   Objective: Vitals:   03/06/21 1600 03/06/21 2030 03/06/21 2323 03/07/21 0352  BP: (!) 150/43 (!) 167/43 (!) 168/54 (!) 172/50  Pulse: 64 67 72 65  Resp: (!) 21 20 18 17   Temp: 98.6 F (37 C) 98.6 F (37 C) 98.6 F (37 C) 98.5 F (36.9 C)  TempSrc: Oral Oral Oral Oral  SpO2: 98% 100% 98% 98%  Weight:      Height:        Intake/Output Summary (Last 24 hours) at 03/07/2021 0743 Last data filed at 03/06/2021 1222 Gross per 24 hour  Intake --  Output 641 ml  Net -641 ml    Filed Weights   03/03/21 0824 03/03/21 1207 03/06/21 0915  Weight: 74 kg 71.6 kg 73.4 kg    Examination:  General exam: No  distress.  Currently on room air.  Looks chronically ill and deconditioned.  Slow to respond. Respiratory system: Decreased breath sounds at bases bilaterally with some crackles  cardiovascular system: Rate controlled; S1-S2 heard  gastrointestinal system: Abdomen is distended slightly; soft and nontender.  Bowel sounds are heard  extremities: Trace left lower extremity edema present;  right BKA present  Data Reviewed: I have personally reviewed following labs and imaging studies  CBC: Recent Labs  Lab 03/02/21 0223 03/03/21 0149 03/06/21 0935  WBC 3.6* 4.1 5.2  NEUTROABS 2.2  --  3.5  HGB 8.4* 8.7* 9.2*  HCT 26.6* 28.0* 29.0*  MCV 97.8 97.2 95.7  PLT 103* 115* 132*    Basic Metabolic Panel: Recent Labs  Lab 03/01/21 0302 03/02/21 0223 03/03/21 0149 03/06/21 0935  NA 135 133* 137 136  K 4.4 3.8 4.1 4.2  CL 95* 96* 99 97*  CO2 26 27 29 25   GLUCOSE 96 98 115* 109*  BUN 46* 22 33* 51*  CREATININE 8.45* 5.31* 7.43* 9.90*  CALCIUM 9.4 8.7* 9.2 9.5  PHOS  --  5.2*  --  7.7*    GFR: Estimated Creatinine Clearance: 7.4 mL/min (A) (by C-G formula based on SCr of 9.9 mg/dL (H)). Liver Function Tests: Recent Labs  Lab 03/02/21 0223 03/06/21 0935  ALBUMIN 3.1* 3.4*    No results for input(s): LIPASE, AMYLASE in the last 168 hours. No results for input(s): AMMONIA in the last 168 hours. Coagulation Profile: No results for input(s): INR, PROTIME in the last 168 hours. Cardiac Enzymes: No results for input(s): CKTOTAL, CKMB, CKMBINDEX, TROPONINI in the last 168 hours. BNP (last 3 results) No results for input(s): PROBNP in the last 8760 hours. HbA1C: No results for input(s): HGBA1C in the last 72 hours. CBG: No results for input(s): GLUCAP in the last 168 hours.  Lipid Profile: No results for input(s): CHOL, HDL, LDLCALC, TRIG, CHOLHDL, LDLDIRECT in the last 72 hours. Thyroid Function Tests: No results for input(s): TSH, T4TOTAL, FREET4, T3FREE, THYROIDAB in the last 72 hours. Anemia Panel: No results for input(s): VITAMINB12, FOLATE, FERRITIN, TIBC, IRON, RETICCTPCT in the last 72 hours.  Sepsis Labs: No results for input(s): PROCALCITON, LATICACIDVEN in the last 168 hours.  Recent Results (from the past 240 hour(s))  Resp Panel by RT-PCR (Flu A&B, Covid) Nasopharyngeal Swab     Status: None   Collection Time: 03/01/21  8:07 AM   Specimen:  Nasopharyngeal Swab; Nasopharyngeal(NP) swabs in vial transport medium  Result Value Ref Range Status   SARS Coronavirus 2 by RT PCR NEGATIVE NEGATIVE Final    Comment: (NOTE) SARS-CoV-2 target nucleic acids are NOT DETECTED.  The SARS-CoV-2 RNA is generally detectable in upper respiratory specimens during the acute phase of infection. The lowest concentration of SARS-CoV-2 viral copies this assay can detect is 138 copies/mL. A negative result does not preclude SARS-Cov-2 infection and should not be used as the sole basis for treatment or other patient management decisions. A negative result may occur with  improper specimen collection/handling, submission of specimen other than nasopharyngeal swab, presence of viral mutation(s) within the areas targeted by this assay, and inadequate number of viral copies(<138 copies/mL). A negative result must be combined with clinical observations, patient history, and epidemiological information. The expected result is Negative.  Fact Sheet for Patients:  EntrepreneurPulse.com.au  Fact Sheet for Healthcare Providers:  IncredibleEmployment.be  This test is no t yet approved or cleared by the [place] and  has been authorized for  detection and/or diagnosis of SARS-CoV-2 by FDA under an Emergency Use Authorization (EUA). This EUA will remain  in effect (meaning this test can be used) for the duration of the COVID-19 declaration under Section 564(b)(1) of the Act, 21 U.S.C.section 360bbb-3(b)(1), unless the authorization is terminated  or revoked sooner.       Influenza A by PCR NEGATIVE NEGATIVE Final   Influenza B by PCR NEGATIVE NEGATIVE Final    Comment: (NOTE) The Xpert Xpress SARS-CoV-2/FLU/RSV plus assay is intended as an aid in the diagnosis of influenza from Nasopharyngeal swab specimens and should not be used as a sole basis for treatment. Nasal washings and aspirates are unacceptable for  Xpert Xpress SARS-CoV-2/FLU/RSV testing.  Fact Sheet for Patients: EntrepreneurPulse.com.au  Fact Sheet for Healthcare Providers: IncredibleEmployment.be  This test is not yet approved or cleared by the [place] FDA and has been authorized for detection and/or diagnosis of SARS-CoV-2 by FDA under an Emergency Use Authorization (EUA). This EUA will remain in effect (meaning this test can be used) for the duration of the COVID-19 declaration under Section 564(b)(1) of the Act, 21 U.S.C. section 360bbb-3(b)(1), unless the authorization is terminated or revoked.  Performed at [facility], [place] [address], [place], [place] [address]           Radiology Studies: No results found.      Scheduled Meds:  (feeding supplement) PROSource Plus  30 mL Oral TID BM   amiodarone  200 mg Oral Daily   amLODipine  10 mg Oral Daily   atorvastatin  80 mg Oral Q supper   Chlorhexidine Gluconate Cloth  6 each Topical Q0600   darbepoetin (ARANESP) injection - DIALYSIS  100 mcg Intravenous Q Mon-HD   heparin  5,000 Units Subcutaneous Q12H   lanthanum  2,000 mg Oral TID WC   pantoprazole  40 mg Oral Daily   vitamin B-12  1,000 mcg Oral QPM   Continuous Infusions:        Aline August, MD [facility] 03/07/2021, 7:43 AM

## 2021-03-07 NOTE — Progress Notes (Addendum)
Occupational Therapy Treatment Patient Details Name: Jose Garrett. MRN: [id number] DOB: 1950-04-23 Today's Date: 03/07/2021   History of present illness 70 y.o. m admitted on 02/27/21 due to increased weakness of BUE weakness and has become WC bound in the past 3 weeks due to weakness. MRI shows subacute c-spine cord compression, which he is not a surgical candidate for. PMH significant for amyloidosis, chronic combined CHF, ESRD, CAD, peripheral vascular disease status post right BKA, severe AS status post TAVR.   OT comments  Jose Garrett is progressing well with better tolerance for OOB transfer this session. Pt required verbal cues for log rolling and min A to transfer to sitting EOB. While sitting EOB pt was educated on the use of a plate guard to increase  self feeding indep and he demonstrated great understanding. He completed L lateral scoot transfer from bed>chair with min A. He remains limited by generalized weakness, poor coordination, chronic back pain and RLE amputation.  D/c plan remains appropriate. OT to continue to follow acutely.    Recommendations for follow up therapy are one component of a multi-disciplinary discharge planning process, led by the attending physician.  Recommendations may be updated based on patient status, additional functional criteria and insurance authorization.    Follow Up Recommendations  Skilled nursing-short term rehab (<3 hours/day)    Assistance Recommended at Discharge Frequent or constant Supervision/Assistance  Equipment Recommendations  None recommended by OT       Precautions / Restrictions Precautions Precautions: Fall Restrictions Weight Bearing Restrictions: No       Mobility Bed Mobility Overal bed mobility: Needs Assistance Bed Mobility: Rolling;Sidelying to Sit;Sit to Sidelying Rolling: Supervision Sidelying to sit: Min assist       General bed mobility comments: verbal cues for log rolling    Transfers Overall  transfer level: Needs assistance Equipment used: None Transfers: Bed to chair/wheelchair/BSC            Lateral/Scoot Transfers: Min assist General transfer comment: min A for verbal cues and blocking LLE. pt does not have great sensation in BUE and places them in unsafe positions during transfer     Balance Overall balance assessment: Needs assistance Sitting-balance support: Feet supported Sitting balance-Leahy Scale: Fair             ADL either performed or assessed with clinical judgement   ADL Overall ADL's : Needs assistance/impaired   Eating/Feeding Details (indicate cue type and reason): provided pt with plate guard and educated on use                     Toilet Transfer: Minimal assistance;BSC/3in1 Armed forces technical officer Details (indicate cue type and reason): simulated from bed>chair. lateral scoot towards L with drop arm.         Functional mobility during ADLs: Minimal assistance General ADL Comments: session focused on getting pt OOB. he did not want to "fool with" his R prothstetic therefore performed L lateral scoot with drom arm recliner. Pt benefits from min A and verbal cues for safe placement of BUE    Extremity/Trunk Assessment Upper Extremity Assessment RUE Deficits / Details: Poor coordination, weakness, and tingly/diminished feeling RUE Sensation: decreased light touch;decreased proprioception RUE Coordination: decreased fine motor;decreased gross motor LUE Deficits / Details: Poor coordination, weakness, and tingly/diminished feeling LUE Sensation: decreased light touch;decreased proprioception LUE Coordination: decreased fine motor;decreased gross motor   Lower Extremity Assessment Lower Extremity Assessment: Defer to PT evaluation        Vision  Vision Assessment?: No apparent visual deficits          Cognition Arousal/Alertness: Awake/alert Behavior During Therapy: WFL for tasks assessed/performed Overall Cognitive Status:  Within Functional Limits for tasks assessed                                 General Comments: verbalized great understanding of plate guard after education                General Comments VSS on RA    Pertinent Vitals/ Pain       Pain Assessment: Faces Faces Pain Scale: Hurts a little bit Pain Location: back Pain Descriptors / Indicators: Aching;Sore;Spasm Pain Intervention(s): Limited activity within patient's tolerance;Monitored during session   Frequency  Min 2X/week        Progress Toward Goals  OT Goals(current goals can now be found in the care plan section)  Progress towards OT goals: Progressing toward goals  Acute Rehab OT Goals Patient Stated Goal: OOB with every meal OT Goal Formulation: With patient Time For Goal Achievement: 03/15/21 Potential to Achieve Goals: Fair ADL Goals Pt Will Perform Grooming: with supervision;sitting Pt Will Perform Lower Body Bathing: with min assist;with adaptive equipment;sitting/lateral leans;sit to/from stand Pt Will Perform Lower Body Dressing: with min assist;sitting/lateral leans;sit to/from stand;with adaptive equipment Pt Will Transfer to Toilet: with min assist;with transfer board Pt Will Perform Toileting - Clothing Manipulation and hygiene: with min assist;sitting/lateral leans;sit to/from stand;with adaptive equipment  Plan Discharge plan remains appropriate;Frequency remains appropriate       AM-PAC OT "6 Clicks" Daily Activity     Outcome Measure   Help from another person eating meals?: A Little Help from another person taking care of personal grooming?: A Little Help from another person toileting, which includes using toliet, bedpan, or urinal?: A Lot Help from another person bathing (including washing, rinsing, drying)?: A Lot Help from another person to put on and taking off regular upper body clothing?: A Little Help from another person to put on and taking off regular lower body clothing?:  A Lot 6 Click Score: 15    End of Session Equipment Utilized During Treatment: Gait belt  OT Visit Diagnosis: Unsteadiness on feet (R26.81);Other abnormalities of gait and mobility (R26.89);Muscle weakness (generalized) (M62.81)   Activity Tolerance Patient tolerated treatment well   Patient Left in bed;with call bell/phone within reach;with family/visitor present   Nurse Communication Mobility status        Time: 1610-1630 OT Time Calculation (min): 20 min  Charges: OT General Charges $OT Visit: 1 Visit OT Treatments $Therapeutic Activity: 8-22 mins   Amanii Snethen A Evander Macaraeg 03/07/2021, 4:54 PM

## 2021-03-07 NOTE — Progress Notes (Signed)
Patient ID: Jose Gulley., male   DOB: 1950-08-23, 70 y.o.   MRN: [id number] S: no acute events, no c/o's. Seen in room  O:BP (!) 168/53 (BP Location: Left Arm)    Pulse 68    Temp 98 F (36.7 C) (Oral)    Resp 20    Ht 5\' 10"  (1.778 m)    Wt 73.4 kg    SpO2 100%    BMI 23.22 kg/m  No intake or output data in the 24 hours ending 03/07/21 1737   Intake/Output: I/O last 3 completed shifts: In: -  Out: 641 [Other:641]  Intake/Output this shift:  No intake/output data recorded. Weight change:  Gen: NAD, laying flat in bed CVS: RRR Resp:CTA Abd: +BS, soft, NT/nd Ext: rt bka, no edema Neuro: awake, alert Dialysis access: R AVF +T/B  OP HD: MWF [facility]  4h  400/1.5  71.9kg  2/2 bath  Hep 2400  RUA AVF Mircera 75 mcg q4wks - last 02/15/21 Calcitriol 70mcg PO qHD-last 02/27/21      Assessment/Plan: Spinal Cord Compression-imaging shows advanced degenerative changes and spinal canal stenosis with cord compression. Neurosurgery consulted. Possibly surgery planned for 03/17/21.  ESRD - On HD per usual MWF schedule. HD tomorrow.  Hypertension/volume-Bps varaible; monitor trend; continue on Amlodipine and Hydralazine. Euvolemic on exam Anemia of CKD - Hgb down to 8.7 12/23, resumed esa here-first dose 12/23 Secondary Hyperparathyroidism - on lanthanum Nutrition - Renal diet with fluid restriction; protein supplements  [name], MD 03/06/2021, 11:55 AM     Studies/Results: No results found.  (feeding supplement) PROSource Plus  30 mL Oral TID BM   amiodarone  200 mg Oral Daily   amLODipine  10 mg Oral Daily   atorvastatin  80 mg Oral Q supper   Chlorhexidine Gluconate Cloth  6 each Topical Q0600   clopidogrel  75 mg Oral Daily   darbepoetin (ARANESP) injection - DIALYSIS  100 mcg Intravenous Q Mon-HD   heparin  5,000 Units Subcutaneous Q12H   lanthanum  2,000 mg Oral TID WC   pantoprazole  40 mg Oral Daily   vitamin B-12  1,000 mcg Oral QPM

## 2021-03-08 DIAGNOSIS — G9520 Unspecified cord compression: Secondary | ICD-10-CM | POA: Diagnosis not present

## 2021-03-08 LAB — RENAL FUNCTION PANEL
Albumin: 3.3 g/dL — ABNORMAL LOW (ref 3.5–5.0)
Anion gap: 14 (ref 5–15)
BUN: 40 mg/dL — ABNORMAL HIGH (ref 8–23)
CO2: 26 mmol/L (ref 22–32)
Calcium: 9.7 mg/dL (ref 8.9–10.3)
Chloride: 96 mmol/L — ABNORMAL LOW (ref 98–111)
Creatinine, Ser: 9.12 mg/dL — ABNORMAL HIGH (ref 0.61–1.24)
GFR, Estimated: 6 mL/min — ABNORMAL LOW (ref >60)
Glucose, Bld: 98 mg/dL (ref 70–99)
Phosphorus: 6.7 mg/dL — ABNORMAL HIGH (ref 2.5–4.6)
Potassium: 4.3 mmol/L (ref 3.5–5.1)
Sodium: 136 mmol/L (ref 135–145)

## 2021-03-08 LAB — CBC
HCT: 30.2 % — ABNORMAL LOW (ref 39.0–52.0)
Hemoglobin: 10.2 g/dL — ABNORMAL LOW (ref 13.0–17.0)
MCH: 31.8 pg (ref 26.0–34.0)
MCHC: 33.8 g/dL (ref 30.0–36.0)
MCV: 94.1 fL (ref 80.0–100.0)
Platelets: 131 10*3/uL — ABNORMAL LOW (ref 150–400)
RBC: 3.21 MIL/uL — ABNORMAL LOW (ref 4.22–5.81)
RDW: 15 % (ref 11.5–15.5)
WBC: 4.9 10*3/uL (ref 4.0–10.5)
nRBC: 0 % (ref 0.0–0.2)

## 2021-03-08 NOTE — Progress Notes (Signed)
Patient ID: Jose Harren., male   DOB: [birth year], 70 y.o.   MRN: [id number] S: no acute events, seen in HD.   O:BP (!) 148/61    Pulse 60    Temp 98.4 F (36.9 C) (Temporal)    Resp 13    Ht 5\' 10"  (1.778 m)    Wt 71.4 kg    SpO2 98%    BMI 22.59 kg/m  No intake or output data in the 24 hours ending 03/08/21 1054   Intake/Output: No intake/output data recorded.  Intake/Output this shift:  No intake/output data recorded. Weight change:  Gen: NAD, laying flat in bed CVS: RRR Resp:CTA Abd: +BS, soft, NT/nd Ext: rt bka, no edema Neuro: awake, alert Dialysis access: R AVF +T/B  OP HD: MWF [facility]  4h  400/1.5  71.9kg  2/2 bath  Hep 2400  RUA AVF Mircera 75 mcg q4wks - last 02/15/21 Calcitriol 79mcg PO qHD-last 02/27/21      Assessment/Plan: Spinal Cord Compression-imaging shows advanced degenerative changes and spinal canal stenosis with cord compression. Neurosurgery consulted. Possibly surgery planned for 03/17/21.  ESRD - On HD per usual MWF schedule. HD today.  Hypertension/volume-Bps varaible; monitor trend; continue on Amlodipine and Hydralazine. Euvolemic on exam. 2 L UF goal Anemia of CKD - Hgb down to 8.7 12/23, resumed esa here-first dose 12/23 Secondary Hyperparathyroidism - on lanthanum Nutrition - Renal diet with fluid restriction; protein supplements  [name], MD 03/06/2021, 11:55 AM     Studies/Results: No results found.  (feeding supplement) PROSource Plus  30 mL Oral TID BM   amiodarone  200 mg Oral Daily   amLODipine  10 mg Oral Daily   atorvastatin  80 mg Oral Q supper   Chlorhexidine Gluconate Cloth  6 each Topical Q0600   clopidogrel  75 mg Oral Daily   darbepoetin (ARANESP) injection - DIALYSIS  100 mcg Intravenous Q Mon-HD   heparin  5,000 Units Subcutaneous Q12H   lanthanum  2,000 mg Oral TID WC   pantoprazole  40 mg Oral Daily   vitamin B-12  1,000 mcg Oral QPM

## 2021-03-08 NOTE — Progress Notes (Signed)
PROGRESS NOTE  Jose Garrett. YDX:[id number] DOB: 1951/02/10 DOA: 02/27/2021 PCP: [name], NP   LOS: 8 days   Brief Narrative / Interim history: 70 year old male with amyloidosis, chronic combined CHF, ESRD, CAD, peripheral vascular disease status post right BKA, severe AS status post TAVR comes to the hospital with increased weakness of bilateral arms, hands, fingers as well as wheelchair-bound for the past 3 weeks.  Patient has been unable to lift his arms above the shoulder which is completely new.  Imaging in the ED showed subacute spinal cord compression.  Neurosurgery consulted  Subjective / 24h Interval events: Seen in dialysis.  Feels worse appreciates his weakness in his shoulders is worse  Assessment & Plan: Principal Problem Subacute C-spine cord compression-MRI on admission showed advanced degenerative changes resulting in severe spinal canal stenosis with cord compression from C3-4 through C5-6.  Neurosurgery consulted and following,  Planning for surgery prior to discharge  Active Problems Chronic combined CHF-most recent 2D echo in August 2021 showed an EF of 45-50%, mild decreased LV function with global hypokinesis.  There was also grade 1 diastolic dysfunction.  RV was mildly reduced as well.  Appears euvolemic  History of severe aortic stenosis-status post TAVR by Dr. Burt Knack in 2020.  Peripheral vascular disease-seen by vascular surgery as an outpatient, status post BKA on the right.  He has a femoropopliteal bypass graft on the left  Coronary artery disease-most recent cath was done in May 2020 which showed severe single-vessel CAD with chronic total occlusion of the RCA, with collaterals.  Mild calcific nonobstructive LCA disease.  Continue Plavix and statin. No chest pain.  Hold Plavix 12/31 in preparation for surgery  HTN, uncontrolled -Resume home BP meds amlodipine   ESRD on HD -Continue scheduled HD per nephrology    Chronic anemia secondary to ESRD-On  EPO treatment  Thrombocytopenia - unclear cause, mild / stable / improving  Scheduled Meds:  (feeding supplement) PROSource Plus  30 mL Oral TID BM   amiodarone  200 mg Oral Daily   amLODipine  10 mg Oral Daily   atorvastatin  80 mg Oral Q supper   Chlorhexidine Gluconate Cloth  6 each Topical Q0600   clopidogrel  75 mg Oral Daily   darbepoetin (ARANESP) injection - DIALYSIS  100 mcg Intravenous Q Mon-HD   heparin  5,000 Units Subcutaneous Q12H   lanthanum  2,000 mg Oral TID WC   pantoprazole  40 mg Oral Daily   vitamin B-12  1,000 mcg Oral QPM   Continuous Infusions:   PRN Meds:.acetaminophen, albuterol, docusate sodium, hydrALAZINE, loperamide, LORazepam, ondansetron  Diet Orders (From admission, onward)     Start     Ordered   02/28/21 1250  Diet renal with fluid restriction Fluid restriction: 1200 mL Fluid; Room service appropriate? Yes; Fluid consistency: Thin  Diet effective now       Question Answer Comment  Fluid restriction: 1200 mL Fluid   Room service appropriate? Yes   Fluid consistency: Thin      02/28/21 1249            DVT prophylaxis: heparin injection 5,000 Units Start: 02/28/21 1300     Code Status: Full Code  Family Communication: no family at bedside   Status is: Inpatient  Remains inpatient appropriate because: Awaiting surgery, not appropriate for SNF, not stable for home due to worsening weakness  Level of care: Med-Surg  Consultants:  Neurosurgery   Procedures:  none  Microbiology  none  Antimicrobials:  none    Objective: Vitals:   03/08/21 1030 03/08/21 1100 03/08/21 1112 03/08/21 1149  BP: (!) 148/61 (!) 120/52 (!) 149/59 (!) 153/57  Pulse: 60 62 72 64  Resp: 13 16 15 17   Temp:   (!) 97.4 F (36.3 C) (!) 97.5 F (36.4 C)  TempSrc:   Oral Oral  SpO2:    100%  Weight:   69.8 kg   Height:        Intake/Output Summary (Last 24 hours) at 03/08/2021 1233 Last data filed at 03/08/2021 1112 Gross per 24 hour  Intake  --  Output 1637 ml  Net -1637 ml    Filed Weights   03/06/21 0915 03/08/21 0812 03/08/21 1112  Weight: 73.4 kg 71.4 kg 69.8 kg    Examination:  Constitutional: NAD Eyes: Anicteric ENMT: mmm Neck: normal, supple Respiratory: CTA bilaterally, no wheezing Cardiovascular: Regular rate and rhythm, no murmurs Abdomen: Soft, NT, ND, positive bowel sounds Musculoskeletal: no clubbing / cyanosis.  Skin: No rashes seen Neurologic: No new focal deficits  Data Reviewed: I have independently reviewed following labs and imaging studies   CBC: Recent Labs  Lab 03/02/21 0223 03/03/21 0149 03/06/21 0935 03/08/21 0911  WBC 3.6* 4.1 5.2 4.9  NEUTROABS 2.2  --  3.5  --   HGB 8.4* 8.7* 9.2* 10.2*  HCT 26.6* 28.0* 29.0* 30.2*  MCV 97.8 97.2 95.7 94.1  PLT 103* 115* 132* 131*    Basic Metabolic Panel: Recent Labs  Lab 03/02/21 0223 03/03/21 0149 03/06/21 0935 03/08/21 0834  NA 133* 137 136 136  K 3.8 4.1 4.2 4.3  CL 96* 99 97* 96*  CO2 27 29 25 26   GLUCOSE 98 115* 109* 98  BUN 22 33* 51* 40*  CREATININE 5.31* 7.43* 9.90* 9.12*  CALCIUM 8.7* 9.2 9.5 9.7  PHOS 5.2*  --  7.7* 6.7*    Liver Function Tests: Recent Labs  Lab 03/02/21 0223 03/06/21 0935 03/08/21 0834  ALBUMIN 3.1* 3.4* 3.3*    Coagulation Profile: No results for input(s): INR, PROTIME in the last 168 hours. HbA1C: No results for input(s): HGBA1C in the last 72 hours. CBG: No results for input(s): GLUCAP in the last 168 hours.   Recent Results (from the past 240 hour(s))  Resp Panel by RT-PCR (Flu A&B, Covid) Nasopharyngeal Swab     Status: None   Collection Time: 03/01/21  8:07 AM   Specimen: Nasopharyngeal Swab; Nasopharyngeal(NP) swabs in vial transport medium  Result Value Ref Range Status   SARS Coronavirus 2 by RT PCR NEGATIVE NEGATIVE Final    Comment: (NOTE) SARS-CoV-2 target nucleic acids are NOT DETECTED.  The SARS-CoV-2 RNA is generally detectable in upper respiratory specimens during  the acute phase of infection. The lowest concentration of SARS-CoV-2 viral copies this assay can detect is 138 copies/mL. A negative result does not preclude SARS-Cov-2 infection and should not be used as the sole basis for treatment or other patient management decisions. A negative result may occur with  improper specimen collection/handling, submission of specimen other than nasopharyngeal swab, presence of viral mutation(s) within the areas targeted by this assay, and inadequate number of viral copies(<138 copies/mL). A negative result must be combined with clinical observations, patient history, and epidemiological information. The expected result is Negative.  Fact Sheet for Patients:  EntrepreneurPulse.com.au  Fact Sheet for Healthcare Providers:  IncredibleEmployment.be  This test is no t yet approved or cleared by the [place] FDA and  has been authorized for detection and/or  diagnosis of SARS-CoV-2 by FDA under an Emergency Use Authorization (EUA). This EUA will remain  in effect (meaning this test can be used) for the duration of the COVID-19 declaration under Section 564(b)(1) of the Act, 21 U.S.C.section 360bbb-3(b)(1), unless the authorization is terminated  or revoked sooner.       Influenza A by PCR NEGATIVE NEGATIVE Final   Influenza B by PCR NEGATIVE NEGATIVE Final    Comment: (NOTE) The Xpert Xpress SARS-CoV-2/FLU/RSV plus assay is intended as an aid in the diagnosis of influenza from Nasopharyngeal swab specimens and should not be used as a sole basis for treatment. Nasal washings and aspirates are unacceptable for Xpert Xpress SARS-CoV-2/FLU/RSV testing.  Fact Sheet for Patients: EntrepreneurPulse.com.au  Fact Sheet for Healthcare Providers: IncredibleEmployment.be  This test is not yet approved or cleared by the [place] FDA and has been authorized for detection and/or  diagnosis of SARS-CoV-2 by FDA under an Emergency Use Authorization (EUA). This EUA will remain in effect (meaning this test can be used) for the duration of the COVID-19 declaration under Section 564(b)(1) of the Act, 21 U.S.C. section 360bbb-3(b)(1), unless the authorization is terminated or revoked.  Performed at [facility], [place] [address], [place], [place] [address]       Radiology Studies: No results found.   Marzetta Board, MD, PhD [facility]  Between 7 am - 7 pm I am available, please contact me via Amion (for emergencies) or Securechat (non urgent messages)  Between 7 pm - 7 am I am not available, please contact night coverage MD/APP via Amion

## 2021-03-09 DIAGNOSIS — G9520 Unspecified cord compression: Secondary | ICD-10-CM | POA: Diagnosis not present

## 2021-03-09 MED ORDER — OXYCODONE HCL 5 MG PO TABS
5.0000 mg | ORAL_TABLET | ORAL | Status: DC | PRN
  Administered 2021-03-09 – 2021-03-24 (×11): 5 mg via ORAL
  Filled 2021-03-09 (×12): qty 1

## 2021-03-09 NOTE — Progress Notes (Signed)
Physical Therapy Treatment Patient Details Name: Jose Garrett. MRN: [id number] DOB: 1950-08-09 Today's Date: 03/09/2021   History of Present Illness 70 y.o. m admitted on 02/27/21 due to increased weakness of BUE weakness and has become WC bound in the past 3 weeks due to weakness. MRI shows subacute c-spine cord compression, which he is not a surgical candidate for. PMH significant for amyloidosis, chronic combined CHF, ESRD, CAD, peripheral vascular disease status post right BKA, severe AS status post TAVR.    PT Comments    Pt progressing slowly toward goals.  Surgical intervention scheduled now.  Emphasis on transitions to EOB, transfer to chair without prosthesis using scoot and sit to stand with prosthesis x2 with upright posture and basic pre - gait activity.    Recommendations for follow up therapy are one component of a multi-disciplinary discharge planning process, led by the attending physician.  Recommendations may be updated based on patient status, additional functional criteria and insurance authorization.  Follow Up Recommendations  Skilled nursing-short term rehab (<3 hours/day)     Assistance Recommended at Discharge Frequent or constant Supervision/Assistance  Equipment Recommendations  None recommended by PT    Recommendations for Other Services       Precautions / Restrictions Precautions Precautions: Fall     Mobility  Bed Mobility Overal bed mobility: Needs Assistance Bed Mobility: Rolling;Sidelying to Sit Rolling: Supervision Sidelying to sit: Min assist       General bed mobility comments: laborious roll without UE assist, truncal assist up onto L elbow and up to sitting in lieu of inability to use UE's    Transfers Overall transfer level: Needs assistance   Transfers: Sit to/from Stand;Bed to chair/wheelchair/BSC Sit to Stand: Mod assist;+2 safety/equipment;+2 physical assistance (x2)          Lateral/Scoot Transfers: Min  assist General transfer comment: cues for and assist for hand placement, assist forward and with boost, stability assist for lateral transfer    Ambulation/Gait               General Gait Details: unable   Stairs             Wheelchair Mobility    Modified Rankin (Stroke Patients Only)       Balance Overall balance assessment: Needs assistance Sitting-balance support: Feet supported Sitting balance-Leahy Scale: Fair     Standing balance support: Bilateral upper extremity supported;Reliant on assistive device for balance Standing balance-Leahy Scale: Poor                              Cognition Arousal/Alertness: Awake/alert Behavior During Therapy: WFL for tasks assessed/performed Overall Cognitive Status: Within Functional Limits for tasks assessed                                          Exercises      General Comments General comments (skin integrity, edema, etc.): vss      Pertinent Vitals/Pain Pain Assessment: 0-10 Faces Pain Scale: Hurts little more Pain Location: low back and R lateral knee Pain Descriptors / Indicators: Aching;Sore;Spasm Pain Intervention(s): Monitored during session;Limited activity within patient's tolerance    Home Living                          Prior Function  PT Goals (current goals can now be found in the care plan section) Acute Rehab PT Goals Patient Stated Goal: agreeable to SNF PT Goal Formulation: With patient Time For Goal Achievement: 03/14/21 Potential to Achieve Goals: Fair Progress towards PT goals: Progressing toward goals    Frequency    Min 2X/week      PT Plan Current plan remains appropriate    Co-evaluation              AM-PAC PT "6 Clicks" Mobility   Outcome Measure  Help needed turning from your back to your side while in a flat bed without using bedrails?: A Little Help needed moving from lying on your back to sitting on  the side of a flat bed without using bedrails?: A Little Help needed moving to and from a bed to a chair (including a wheelchair)?: A Lot Help needed standing up from a chair using your arms (e.g., wheelchair or bedside chair)?: Total Help needed to walk in hospital room?: Total Help needed climbing 3-5 steps with a railing? : Total 6 Click Score: 11    End of Session Equipment Utilized During Treatment: Gait belt Activity Tolerance: Patient tolerated treatment well Patient left: in chair;with call bell/phone within reach;Other (comment) (prosthesis on) Nurse Communication: Mobility status PT Visit Diagnosis: Other abnormalities of gait and mobility (R26.89);Muscle weakness (generalized) (M62.81)     Time: 6950-7225 PT Time Calculation (min) (ACUTE ONLY): 25 min  Charges:  $Therapeutic Activity: 23-37 mins                     03/09/2021  Ginger Carne., PT Acute Rehabilitation Services [phone]  (pager) [phone]  (office)   Tessie Fass Menashe Kafer 03/09/2021, 2:15 PM

## 2021-03-09 NOTE — Progress Notes (Signed)
[facility] ([facility])  Hospital Liaison: RN note         This patient has been referred to our palliative care services in the community.  [facility] will continue to follow for any discharge planning needs and to coordinate continuation of palliative care in the outpatient setting.    If you have questions or need assistance, please call [phone] or contact the hospital Liaison listed on AMION.      Thank you for this referral.         Mary Anne Robertson, RN, CCM  [facility] Hospital Liaison   [phone] 

## 2021-03-09 NOTE — Progress Notes (Signed)
Patient ID: Jose Garrett., male   DOB: 09/09/1950, 70 y.o.   MRN: [id number] S: no acute events, seen in HD.   O:BP (!) 178/58 (BP Location: Left Arm)    Pulse 65    Temp 98 F (36.7 C) (Oral)    Resp 15    Ht 5\' 10"  (1.778 m)    Wt 69.8 kg    SpO2 99%    BMI 22.08 kg/m  No intake or output data in the 24 hours ending 03/09/21 1614   Intake/Output: I/O last 3 completed shifts: In: -  Out: 1637 [Other:1637]  Intake/Output this shift:  No intake/output data recorded. Weight change:  Gen: NAD, laying flat in bed CVS: RRR Resp:CTA Abd: +BS, soft, NT/nd Ext: rt bka, no edema Neuro: awake, alert Dialysis access: R AVF +T/B  OP HD: MWF [facility]  4h  400/1.5  71.9kg  2/2 bath  Hep 2400  RUA AVF Mircera 75 mcg q4wks - last 02/15/21 Calcitriol 44mcg PO qHD-last 02/27/21      Assessment/Plan: Spinal Cord Compression-imaging shows advanced degenerative changes and spinal canal stenosis with cord compression. Neurosurgery consulted. Possibly surgery planned for 03/17/21.  ESRD - On HD per usual MWF schedule. Next HD Friday.  Hypertension/volume-Bps varaible; monitor trend; continue on Amlodipine and Hydralazine. Euvolemic on exam. 2 L UF goal Anemia of CKD - Hgb down to 8.7 12/23, resumed esa here-first dose 12/23 Secondary Hyperparathyroidism - on lanthanum Nutrition - Renal diet with fluid restriction; protein supplements  [name], MD 03/06/2021, 11:55 AM     Studies/Results: No results found.  (feeding supplement) PROSource Plus  30 mL Oral TID BM   amiodarone  200 mg Oral Daily   amLODipine  10 mg Oral Daily   atorvastatin  80 mg Oral Q supper   Chlorhexidine Gluconate Cloth  6 each Topical Q0600   clopidogrel  75 mg Oral Daily   darbepoetin (ARANESP) injection - DIALYSIS  100 mcg Intravenous Q Mon-HD   heparin  5,000 Units Subcutaneous Q12H   lanthanum  2,000 mg Oral TID WC   pantoprazole  40 mg Oral Daily   vitamin B-12  1,000 mcg Oral QPM

## 2021-03-09 NOTE — Progress Notes (Signed)
Occupational Therapy Treatment Patient Details Name: Jose Garrett. MRN: [id number] DOB: 1950/06/09 Today's Date: 03/09/2021   History of present illness 70 y.o. m admitted on 02/27/21 due to increased weakness of BUE weakness and has become WC bound in the past 3 weeks due to weakness. MRI shows subacute c-spine cord compression, which he is not a surgical candidate for. PMH significant for amyloidosis, chronic combined CHF, ESRD, CAD, peripheral vascular disease status post right BKA, severe AS status post TAVR.   OT comments  OT treatment session with focus on bed mobility, EOB activity tolerance, sitting balance and BUE HEP with focus on NMR. Patient is making slow progress toward goals limited by fatigue this date. Continues to demonstrate deficits listed below and would benefit from continued acute OT services in prep for safe d/c to next level of care. OT will continue to follow acutely.    Recommendations for follow up therapy are one component of a multi-disciplinary discharge planning process, led by the attending physician.  Recommendations may be updated based on patient status, additional functional criteria and insurance authorization.    Follow Up Recommendations  Skilled nursing-short term rehab (<3 hours/day)    Assistance Recommended at Discharge Frequent or constant Supervision/Assistance  Equipment Recommendations  None recommended by OT    Recommendations for Other Services      Precautions / Restrictions Precautions Precautions: Fall Restrictions Weight Bearing Restrictions: No       Mobility Bed Mobility Overal bed mobility: Needs Assistance Bed Mobility: Rolling;Sidelying to Sit;Sit to Supine Rolling: Supervision Sidelying to sit: Min assist   Sit to supine: Supervision   General bed mobility comments: Good recall of log rolling technique. +bedrail    Transfers Overall transfer level: Needs assistance                 General transfer  comment: Declined transfer to recliner 2/2 fatigue.     Balance Overall balance assessment: Needs assistance Sitting-balance support: Feet supported Sitting balance-Leahy Scale: Fair Sitting balance - Comments: Able to maintain static sitting at EOB for .15 min with and without unilateral UE support on bed surface.                                   ADL either performed or assessed with clinical judgement   ADL Overall ADL's : Needs assistance/impaired                                            Extremity/Trunk Assessment              Vision       Perception     Praxis      Cognition Arousal/Alertness: Awake/alert Behavior During Therapy: WFL for tasks assessed/performed Overall Cognitive Status: Within Functional Limits for tasks assessed                                            Exercises     Shoulder Instructions       General Comments Session limited 2/2 fatigue.    Pertinent Vitals/ Pain       Pain Assessment: 0-10 Pain Score: 2  Pain Location: low back Pain Descriptors / Indicators:  Aching;Sore;Spasm Pain Intervention(s): Limited activity within patient's tolerance;Monitored during session;Repositioned  Home Living                                          Prior Functioning/Environment              Frequency  Min 2X/week        Progress Toward Goals  OT Goals(current goals can now be found in the care plan section)  Progress towards OT goals: Progressing toward goals  Acute Rehab OT Goals Patient Stated Goal: Patient now in agreement with surgical intervention to address cord compression. OT Goal Formulation: With patient Time For Goal Achievement: 03/15/21 Potential to Achieve Goals: Fair ADL Goals Pt Will Perform Grooming: with supervision;sitting Pt Will Perform Lower Body Bathing: with min assist;with adaptive equipment;sitting/lateral leans;sit to/from  stand Pt Will Perform Lower Body Dressing: with min assist;sitting/lateral leans;sit to/from stand;with adaptive equipment Pt Will Transfer to Toilet: with min assist;with transfer board Pt Will Perform Toileting - Clothing Manipulation and hygiene: with min assist;sitting/lateral leans;sit to/from stand;with adaptive equipment  Plan Discharge plan remains appropriate;Frequency remains appropriate    Co-evaluation                 AM-PAC OT "6 Clicks" Daily Activity     Outcome Measure   Help from another person eating meals?: A Little Help from another person taking care of personal grooming?: A Little Help from another person toileting, which includes using toliet, bedpan, or urinal?: A Lot Help from another person bathing (including washing, rinsing, drying)?: A Lot Help from another person to put on and taking off regular upper body clothing?: A Little Help from another person to put on and taking off regular lower body clothing?: A Lot 6 Click Score: 15    End of Session Equipment Utilized During Treatment: Gait belt  OT Visit Diagnosis: Unsteadiness on feet (R26.81);Other abnormalities of gait and mobility (R26.89);Muscle weakness (generalized) (M62.81)   Activity Tolerance Patient tolerated treatment well   Patient Left in bed;with call bell/phone within reach;with bed alarm set   Nurse Communication Mobility status        Time: 3212-2482 OT Time Calculation (min): 20 min  Charges: OT General Charges $OT Visit: 1 Visit OT Treatments $Neuromuscular Re-education: 8-22 mins  Jose Garrett Supplemental OT, Department of rehab services [phone]  Jose Hyser R H. 03/09/2021, 9:32 AM

## 2021-03-09 NOTE — Telephone Encounter (Signed)
done

## 2021-03-09 NOTE — Progress Notes (Signed)
PROGRESS NOTE  Jose Garrett. VOJ:[id number] DOB: 1950-12-13 DOA: 02/27/2021 PCP: [name], NP   LOS: 9 days   Brief Narrative / Interim history: 70 year old male with amyloidosis, chronic combined CHF, ESRD, CAD, peripheral vascular disease status post right BKA, severe AS status post TAVR comes to the hospital with increased weakness of bilateral arms, hands, fingers as well as wheelchair-bound for the past 3 weeks.  Patient has been unable to lift his arms above the shoulder which is completely new.  Imaging in the ED showed subacute spinal cord compression.  Neurosurgery consulted  Subjective / 24h Interval events: No complaints today.  Assessment & Plan: Principal Problem Subacute C-spine cord compression-MRI on admission showed advanced degenerative changes resulting in severe spinal canal stenosis with cord compression from C3-4 through C5-6.  Neurosurgery consulted and following,  Planning for surgery prior to discharge, scheduled to have it done 03/17/2021 with Dr. Kathyrn Sheriff.  Stop Plavix 12/31 in preparation for surgery  Active Problems Chronic combined CHF-most recent 2D echo in August 2021 showed an EF of 45-50%, mild decreased LV function with global hypokinesis.  There was also grade 1 diastolic dysfunction.  RV was mildly reduced as well.  Appears euvolemic  History of severe aortic stenosis-status post TAVR by Dr. Burt Knack in 2020.  Peripheral vascular disease-seen by vascular surgery as an outpatient, status post BKA on the right.  He has a femoropopliteal bypass graft on the left  Coronary artery disease-most recent cath was done in May 2020 which showed severe single-vessel CAD with chronic total occlusion of the RCA, with collaterals.  Mild calcific nonobstructive LCA disease.  Continue Plavix and statin. No chest pain.    HTN, uncontrolled -Resume home BP meds amlodipine   ESRD on HD -Continue scheduled HD per nephrology    Chronic anemia secondary to ESRD-On EPO  treatment  Thrombocytopenia - unclear cause, mild / stable / improving  Scheduled Meds:  (feeding supplement) PROSource Plus  30 mL Oral TID BM   amiodarone  200 mg Oral Daily   amLODipine  10 mg Oral Daily   atorvastatin  80 mg Oral Q supper   Chlorhexidine Gluconate Cloth  6 each Topical Q0600   clopidogrel  75 mg Oral Daily   darbepoetin (ARANESP) injection - DIALYSIS  100 mcg Intravenous Q Mon-HD   heparin  5,000 Units Subcutaneous Q12H   lanthanum  2,000 mg Oral TID WC   pantoprazole  40 mg Oral Daily   vitamin B-12  1,000 mcg Oral QPM   Continuous Infusions:   PRN Meds:.acetaminophen, albuterol, docusate sodium, hydrALAZINE, loperamide, LORazepam, ondansetron, oxyCODONE  Diet Orders (From admission, onward)     Start     Ordered   02/28/21 1250  Diet renal with fluid restriction Fluid restriction: 1200 mL Fluid; Room service appropriate? Yes; Fluid consistency: Thin  Diet effective now       Question Answer Comment  Fluid restriction: 1200 mL Fluid   Room service appropriate? Yes   Fluid consistency: Thin      02/28/21 1249            DVT prophylaxis: heparin injection 5,000 Units Start: 02/28/21 1300     Code Status: Full Code  Family Communication: no family at bedside   Status is: Inpatient  Remains inpatient appropriate because: Awaiting surgery, not appropriate for SNF, not stable for home due to worsening weakness  Level of care: Med-Surg  Consultants:  Neurosurgery   Procedures:  none  Microbiology  none  Antimicrobials: none    Objective: Vitals:   03/08/21 2007 03/08/21 2324 03/09/21 0341 03/09/21 0759  BP: (!) 153/53 (!) 141/42 (!) 163/54 (!) 151/70  Pulse: 69 64 66 68  Resp: 17 18 17 16   Temp: 98.2 F (36.8 C) 98 F (36.7 C) 98 F (36.7 C)   TempSrc: Oral Oral Oral   SpO2: 99% 97% 99% 98%  Weight:      Height:        Intake/Output Summary (Last 24 hours) at 03/09/2021 0942 Last data filed at 03/08/2021 1112 Gross per  24 hour  Intake --  Output 1637 ml  Net -1637 ml    Filed Weights   03/06/21 0915 03/08/21 0812 03/08/21 1112  Weight: 73.4 kg 71.4 kg 69.8 kg    Examination:  Constitutional: NAD Respiratory: CTA Cardiovascular: RRR  Data Reviewed: I have independently reviewed following labs and imaging studies   CBC: Recent Labs  Lab 03/03/21 0149 03/06/21 0935 03/08/21 0911  WBC 4.1 5.2 4.9  NEUTROABS  --  3.5  --   HGB 8.7* 9.2* 10.2*  HCT 28.0* 29.0* 30.2*  MCV 97.2 95.7 94.1  PLT 115* 132* 131*    Basic Metabolic Panel: Recent Labs  Lab 03/03/21 0149 03/06/21 0935 03/08/21 0834  NA 137 136 136  K 4.1 4.2 4.3  CL 99 97* 96*  CO2 29 25 26   GLUCOSE 115* 109* 98  BUN 33* 51* 40*  CREATININE 7.43* 9.90* 9.12*  CALCIUM 9.2 9.5 9.7  PHOS  --  7.7* 6.7*    Liver Function Tests: Recent Labs  Lab 03/06/21 0935 03/08/21 0834  ALBUMIN 3.4* 3.3*    Coagulation Profile: No results for input(s): INR, PROTIME in the last 168 hours. HbA1C: No results for input(s): HGBA1C in the last 72 hours. CBG: No results for input(s): GLUCAP in the last 168 hours.   Recent Results (from the past 240 hour(s))  Resp Panel by RT-PCR (Flu A&B, Covid) Nasopharyngeal Swab     Status: None   Collection Time: 03/01/21  8:07 AM   Specimen: Nasopharyngeal Swab; Nasopharyngeal(NP) swabs in vial transport medium  Result Value Ref Range Status   SARS Coronavirus 2 by RT PCR NEGATIVE NEGATIVE Final    Comment: (NOTE) SARS-CoV-2 target nucleic acids are NOT DETECTED.  The SARS-CoV-2 RNA is generally detectable in upper respiratory specimens during the acute phase of infection. The lowest concentration of SARS-CoV-2 viral copies this assay can detect is 138 copies/mL. A negative result does not preclude SARS-Cov-2 infection and should not be used as the sole basis for treatment or other patient management decisions. A negative result may occur with  improper specimen collection/handling,  submission of specimen other than nasopharyngeal swab, presence of viral mutation(s) within the areas targeted by this assay, and inadequate number of viral copies(<138 copies/mL). A negative result must be combined with clinical observations, patient history, and epidemiological information. The expected result is Negative.  Fact Sheet for Patients:  EntrepreneurPulse.com.au  Fact Sheet for Healthcare Providers:  IncredibleEmployment.be  This test is no t yet approved or cleared by the [place] FDA and  has been authorized for detection and/or diagnosis of SARS-CoV-2 by FDA under an Emergency Use Authorization (EUA). This EUA will remain  in effect (meaning this test can be used) for the duration of the COVID-19 declaration under Section 564(b)(1) of the Act, 21 U.S.C.section 360bbb-3(b)(1), unless the authorization is terminated  or revoked sooner.       Influenza A by  PCR NEGATIVE NEGATIVE Final   Influenza B by PCR NEGATIVE NEGATIVE Final    Comment: (NOTE) The Xpert Xpress SARS-CoV-2/FLU/RSV plus assay is intended as an aid in the diagnosis of influenza from Nasopharyngeal swab specimens and should not be used as a sole basis for treatment. Nasal washings and aspirates are unacceptable for Xpert Xpress SARS-CoV-2/FLU/RSV testing.  Fact Sheet for Patients: EntrepreneurPulse.com.au  Fact Sheet for Healthcare Providers: IncredibleEmployment.be  This test is not yet approved or cleared by the [place] FDA and has been authorized for detection and/or diagnosis of SARS-CoV-2 by FDA under an Emergency Use Authorization (EUA). This EUA will remain in effect (meaning this test can be used) for the duration of the COVID-19 declaration under Section 564(b)(1) of the Act, 21 U.S.C. section 360bbb-3(b)(1), unless the authorization is terminated or revoked.  Performed at [facility], [place] [address], [place], [place] [address]       Radiology Studies: No results found.   Marzetta Board, MD, PhD [facility]  Between 7 am - 7 pm I am available, please contact me via Amion (for emergencies) or Securechat (non urgent messages)  Between 7 pm - 7 am I am not available, please contact night coverage MD/APP via Amion

## 2021-03-10 LAB — CBC
HCT: 30.6 % — ABNORMAL LOW (ref 39.0–52.0)
Hemoglobin: 9.8 g/dL — ABNORMAL LOW (ref 13.0–17.0)
MCH: 30.9 pg (ref 26.0–34.0)
MCHC: 32 g/dL (ref 30.0–36.0)
MCV: 96.5 fL (ref 80.0–100.0)
Platelets: 137 10*3/uL — ABNORMAL LOW (ref 150–400)
RBC: 3.17 MIL/uL — ABNORMAL LOW (ref 4.22–5.81)
RDW: 15.2 % (ref 11.5–15.5)
WBC: 5.2 10*3/uL (ref 4.0–10.5)
nRBC: 0 % (ref 0.0–0.2)

## 2021-03-10 LAB — BASIC METABOLIC PANEL
Anion gap: 14 (ref 5–15)
BUN: 39 mg/dL — ABNORMAL HIGH (ref 8–23)
CO2: 25 mmol/L (ref 22–32)
Calcium: 9.7 mg/dL (ref 8.9–10.3)
Chloride: 98 mmol/L (ref 98–111)
Creatinine, Ser: 9.01 mg/dL — ABNORMAL HIGH (ref 0.61–1.24)
GFR, Estimated: 6 mL/min — ABNORMAL LOW (ref >60)
Glucose, Bld: 89 mg/dL (ref 70–99)
Potassium: 4.3 mmol/L (ref 3.5–5.1)
Sodium: 137 mmol/L (ref 135–145)

## 2021-03-10 MED ORDER — HEPARIN SODIUM (PORCINE) 1000 UNIT/ML IJ SOLN
INTRAMUSCULAR | Status: AC
  Filled 2021-03-10: qty 3

## 2021-03-10 NOTE — Progress Notes (Signed)
PROGRESS NOTE  Jose Garrett. ELF:[id number] DOB: 02-05-1951 DOA: 02/27/2021 PCP: [name], NP   LOS: 10 days   Brief Narrative / Interim history: 70 year old male with amyloidosis, chronic combined CHF, ESRD, CAD, peripheral vascular disease status post right BKA, severe AS status post TAVR comes to the hospital with increased weakness of bilateral arms, hands, fingers as well as wheelchair-bound for the past 3 weeks.  Patient has been unable to lift his arms above the shoulder which is completely new.  Imaging in the ED showed subacute spinal cord compression.  Neurosurgery consulted  Subjective / 24h Interval events: No specific complaints  Assessment & Plan: Principal Problem Subacute C-spine cord compression-MRI on admission showed advanced degenerative changes resulting in severe spinal canal stenosis with cord compression from C3-4 through C5-6.  Neurosurgery consulted and following,  Planning for surgery prior to discharge, scheduled to have it done 03/17/2021 with Dr. Kathyrn Sheriff.  Stop Plavix 12/31 in preparation for surgery  Active Problems Chronic combined CHF-most recent 2D echo in August 2021 showed an EF of 45-50%, mild decreased LV function with global hypokinesis.  There was also grade 1 diastolic dysfunction.  RV was mildly reduced as well.  Appears euvolemic  History of severe aortic stenosis-status post TAVR by Dr. Burt Knack in 2020.  Peripheral vascular disease-seen by vascular surgery as an outpatient, status post BKA on the right.  He has a femoropopliteal bypass graft on the left  Coronary artery disease-most recent cath was done in May 2020 which showed severe single-vessel CAD with chronic total occlusion of the RCA, with collaterals.  Mild calcific nonobstructive LCA disease.  Continue Plavix and statin. No chest pain.    HTN, uncontrolled -Resume home BP meds amlodipine   ESRD on HD -Continue scheduled HD per nephrology    Chronic anemia secondary to ESRD-On  EPO treatment  Thrombocytopenia - unclear cause, mild / stable / improving  Scheduled Meds:  (feeding supplement) PROSource Plus  30 mL Oral TID BM   amiodarone  200 mg Oral Daily   amLODipine  10 mg Oral Daily   atorvastatin  80 mg Oral Q supper   Chlorhexidine Gluconate Cloth  6 each Topical Q0600   clopidogrel  75 mg Oral Daily   darbepoetin (ARANESP) injection - DIALYSIS  100 mcg Intravenous Q Mon-HD   heparin  5,000 Units Subcutaneous Q12H   lanthanum  2,000 mg Oral TID WC   pantoprazole  40 mg Oral Daily   vitamin B-12  1,000 mcg Oral QPM   Continuous Infusions:   PRN Meds:.acetaminophen, albuterol, docusate sodium, hydrALAZINE, loperamide, LORazepam, ondansetron, oxyCODONE  Diet Orders (From admission, onward)     Start     Ordered   02/28/21 1250  Diet renal with fluid restriction Fluid restriction: 1200 mL Fluid; Room service appropriate? Yes; Fluid consistency: Thin  Diet effective now       Question Answer Comment  Fluid restriction: 1200 mL Fluid   Room service appropriate? Yes   Fluid consistency: Thin      02/28/21 1249            DVT prophylaxis: heparin injection 5,000 Units Start: 02/28/21 1300     Code Status: Full Code  Family Communication: no family at bedside   Status is: Inpatient  Remains inpatient appropriate because: Awaiting surgery, not appropriate for SNF, not stable for home due to worsening weakness  Level of care: Med-Surg  Consultants:  Neurosurgery   Procedures:  none  Microbiology  none  Antimicrobials: none    Objective: Vitals:   03/09/21 2349 03/10/21 0434 03/10/21 0925 03/10/21 1129  BP: (!) 152/47 (!) 160/44 (!) 145/73 (!) 159/57  Pulse: 70 67 75 66  Resp: 17 18 20 16   Temp: 97.9 F (36.6 C) 98 F (36.7 C) 97.6 F (36.4 C) 97.7 F (36.5 C)  TempSrc: Oral Oral Oral Oral  SpO2: 97% 98% 99% 98%  Weight:      Height:       No intake or output data in the 24 hours ending 03/10/21 1152  Filed Weights    03/06/21 0915 03/08/21 0812 03/08/21 1112  Weight: 73.4 kg 71.4 kg 69.8 kg    Examination:  Constitutional: No distress Respiratory: Clear, no wheezing Cardiovascular: Regular rate and rhythm, no edema  Data Reviewed: I have independently reviewed following labs and imaging studies   CBC: Recent Labs  Lab 03/06/21 0935 03/08/21 0911 03/10/21 0231  WBC 5.2 4.9 5.2  NEUTROABS 3.5  --   --   HGB 9.2* 10.2* 9.8*  HCT 29.0* 30.2* 30.6*  MCV 95.7 94.1 96.5  PLT 132* 131* 137*    Basic Metabolic Panel: Recent Labs  Lab 03/06/21 0935 03/08/21 0834 03/10/21 0231  NA 136 136 137  K 4.2 4.3 4.3  CL 97* 96* 98  CO2 25 26 25   GLUCOSE 109* 98 89  BUN 51* 40* 39*  CREATININE 9.90* 9.12* 9.01*  CALCIUM 9.5 9.7 9.7  PHOS 7.7* 6.7*  --     Liver Function Tests: Recent Labs  Lab 03/06/21 0935 03/08/21 0834  ALBUMIN 3.4* 3.3*    Coagulation Profile: No results for input(s): INR, PROTIME in the last 168 hours. HbA1C: No results for input(s): HGBA1C in the last 72 hours. CBG: No results for input(s): GLUCAP in the last 168 hours.   Recent Results (from the past 240 hour(s))  Resp Panel by RT-PCR (Flu A&B, Covid) Nasopharyngeal Swab     Status: None   Collection Time: 03/01/21  8:07 AM   Specimen: Nasopharyngeal Swab; Nasopharyngeal(NP) swabs in vial transport medium  Result Value Ref Range Status   SARS Coronavirus 2 by RT PCR NEGATIVE NEGATIVE Final    Comment: (NOTE) SARS-CoV-2 target nucleic acids are NOT DETECTED.  The SARS-CoV-2 RNA is generally detectable in upper respiratory specimens during the acute phase of infection. The lowest concentration of SARS-CoV-2 viral copies this assay can detect is 138 copies/mL. A negative result does not preclude SARS-Cov-2 infection and should not be used as the sole basis for treatment or other patient management decisions. A negative result may occur with  improper specimen collection/handling, submission of specimen  other than nasopharyngeal swab, presence of viral mutation(s) within the areas targeted by this assay, and inadequate number of viral copies(<138 copies/mL). A negative result must be combined with clinical observations, patient history, and epidemiological information. The expected result is Negative.  Fact Sheet for Patients:  EntrepreneurPulse.com.au  Fact Sheet for Healthcare Providers:  IncredibleEmployment.be  This test is no t yet approved or cleared by the [place] FDA and  has been authorized for detection and/or diagnosis of SARS-CoV-2 by FDA under an Emergency Use Authorization (EUA). This EUA will remain  in effect (meaning this test can be used) for the duration of the COVID-19 declaration under Section 564(b)(1) of the Act, 21 U.S.C.section 360bbb-3(b)(1), unless the authorization is terminated  or revoked sooner.       Influenza A by PCR NEGATIVE NEGATIVE Final   Influenza B by  PCR NEGATIVE NEGATIVE Final    Comment: (NOTE) The Xpert Xpress SARS-CoV-2/FLU/RSV plus assay is intended as an aid in the diagnosis of influenza from Nasopharyngeal swab specimens and should not be used as a sole basis for treatment. Nasal washings and aspirates are unacceptable for Xpert Xpress SARS-CoV-2/FLU/RSV testing.  Fact Sheet for Patients: EntrepreneurPulse.com.au  Fact Sheet for Healthcare Providers: IncredibleEmployment.be  This test is not yet approved or cleared by the [place] FDA and has been authorized for detection and/or diagnosis of SARS-CoV-2 by FDA under an Emergency Use Authorization (EUA). This EUA will remain in effect (meaning this test can be used) for the duration of the COVID-19 declaration under Section 564(b)(1) of the Act, 21 U.S.C. section 360bbb-3(b)(1), unless the authorization is terminated or revoked.  Performed at [facility], [place] [address], [place],  [place] [address]       Radiology Studies: No results found.   Marzetta Board, MD, PhD [facility]  Between 7 am - 7 pm I am available, please contact me via Amion (for emergencies) or Securechat (non urgent messages)  Between 7 pm - 7 am I am not available, please contact night coverage MD/APP via Amion

## 2021-03-10 NOTE — Progress Notes (Addendum)
Patient ID: Jose Buhl., male   DOB: 1950/08/04, 70 y.o.   MRN: [id number] S: no new c/o's. Main issues are numbness in hands and weakness of upper arms bilat.   O:BP (!) 159/57 (BP Location: Left Arm)    Pulse 66    Temp 97.7 F (36.5 C) (Oral)    Resp 16    Ht 5\' 10"  (1.778 m)    Wt 69.8 kg    SpO2 98%    BMI 22.08 kg/m  No intake or output data in the 24 hours ending 03/10/21 1307   Intake/Output: No intake/output data recorded.  Intake/Output this shift:  No intake/output data recorded. Weight change:  Gen: NAD, laying flat in bed CVS: RRR Resp:CTA Abd: +BS, soft, NT/nd Ext: rt bka, no edema Neuro: awake, alert Dialysis access: R AVF +T/B  OP HD: MWF [facility]  4h  400/1.5  71.9kg  2/2 bath  Hep 2400  RUA AVF Mircera 75 mcg q4wks - last 02/15/21 Calcitriol 88mcg PO qHD-last 02/27/21      Assessment/Plan: Spinal Cord Compression-imaging shows advanced degenerative changes and spinal canal stenosis with cord compression. Neurosurgery consulted. Possibly surgery planned for 03/17/21.  ESRD - On HD per usual MWF schedule. HD today. Pt quite stable, will see again on Sunday.  Hypertension/volume- continue on norvasc here. No vol excess on exam, under dry wt 1-2kg.  Small UF 1-2 L w/ next hd.  Anemia of CKD - Hgb down to 8.7 12/23, resumed esa here-first dose 12/23 Secondary Hyperparathyroidism - on lanthanum Nutrition - Renal diet with fluid restriction; protein supplements  [name], MD 03/06/2021, 11:55 AM     Studies/Results: No results found.  (feeding supplement) PROSource Plus  30 mL Oral TID BM   amiodarone  200 mg Oral Daily   amLODipine  10 mg Oral Daily   atorvastatin  80 mg Oral Q supper   Chlorhexidine Gluconate Cloth  6 each Topical Q0600   clopidogrel  75 mg Oral Daily   darbepoetin (ARANESP) injection - DIALYSIS  100 mcg Intravenous Q Mon-HD   heparin  5,000 Units Subcutaneous Q12H   lanthanum  2,000 mg Oral TID WC   pantoprazole  40 mg Oral Daily    vitamin B-12  1,000 mcg Oral QPM

## 2021-03-11 NOTE — Progress Notes (Signed)
Overall stable.  No new issues or problems.  Patient still considering whether to proceed with cervical surgery for treatment of his severe compressive myelopathy.  Patient with multilevel severe cervical spondylosis and severe stenosis with progressive cervical myelopathy.  Patient to discuss things further with Dr. Kathyrn Sheriff early this week with regard to possible surgical decompression and fusion for treatment of his problem.

## 2021-03-11 NOTE — Progress Notes (Signed)
PROGRESS NOTE  Jose Garrett. [id number]:[id number] DOB: 11/28/1950 DOA: 02/27/2021 PCP: [name], NP   LOS: 11 days   Brief Narrative / Interim history: 70 year old male with amyloidosis, chronic combined CHF, ESRD, CAD, peripheral vascular disease status post right BKA, severe AS status post TAVR comes to the hospital with increased weakness of bilateral arms, hands, fingers as well as wheelchair-bound for the past 3 weeks.  Patient has been unable to lift his arms above the shoulder which is completely new.  Imaging in the ED showed subacute spinal cord compression.  Neurosurgery consulted, plans are in place for patient to undergo decompression on 1/6  Subjective / 24h Interval events: No complaints this morning, no chest pain, no shortness of breath  Assessment & Plan: Principal Problem Subacute C-spine cord compression-MRI on admission showed advanced degenerative changes resulting in severe spinal canal stenosis with cord compression from C3-4 through C5-6.  Neurosurgery consulted and following,  Planning for surgery prior to discharge, scheduled to have it done 03/17/2021 with Dr. Kathyrn Sheriff.  Stop Plavix today in preparation for surgery next week.  Active Problems Chronic combined CHF-most recent 2D echo in August 2021 showed an EF of 45-50%, mild decreased LV function with global hypokinesis.  There was also grade 1 diastolic dysfunction.  RV was mildly reduced as well.  Appears euvolemic, fluid management with dialysis  History of severe aortic stenosis-status post TAVR by Dr. Burt Knack in 2020.  Peripheral vascular disease-seen by vascular surgery as an outpatient, status post BKA on the right.  He has a femoropopliteal bypass graft on the left  Coronary artery disease-most recent cath was done in May 2020 which showed severe single-vessel CAD with chronic total occlusion of the RCA, with collaterals.  Mild calcific nonobstructive LCA disease.  Continue Plavix and statin. No chest pain.     HTN, uncontrolled -Resume home BP meds amlodipine   ESRD on HD -Continue scheduled HD per nephrology    Chronic anemia secondary to ESRD-On EPO treatment  Thrombocytopenia - unclear cause, mild / stable / improving  Scheduled Meds:  (feeding supplement) PROSource Plus  30 mL Oral TID BM   amiodarone  200 mg Oral Daily   amLODipine  10 mg Oral Daily   atorvastatin  80 mg Oral Q supper   Chlorhexidine Gluconate Cloth  6 each Topical Q0600   darbepoetin (ARANESP) injection - DIALYSIS  100 mcg Intravenous Q Mon-HD   heparin  5,000 Units Subcutaneous Q12H   lanthanum  2,000 mg Oral TID WC   pantoprazole  40 mg Oral Daily   vitamin B-12  1,000 mcg Oral QPM   Continuous Infusions:   PRN Meds:.acetaminophen, albuterol, docusate sodium, hydrALAZINE, loperamide, LORazepam, ondansetron, oxyCODONE  Diet Orders (From admission, onward)     Start     Ordered   02/28/21 1250  Diet renal with fluid restriction Fluid restriction: 1200 mL Fluid; Room service appropriate? Yes; Fluid consistency: Thin  Diet effective now       Question Answer Comment  Fluid restriction: 1200 mL Fluid   Room service appropriate? Yes   Fluid consistency: Thin      02/28/21 1249            DVT prophylaxis: heparin injection 5,000 Units Start: 02/28/21 1300     Code Status: Full Code  Family Communication: no family at bedside   Status is: Inpatient  Remains inpatient appropriate because: Awaiting surgery, not appropriate for SNF, not stable for home due to worsening weakness  Level  of care: Med-Surg  Consultants:  Neurosurgery   Procedures:  none  Microbiology  none  Antimicrobials: none    Objective: Vitals:   03/10/21 2017 03/11/21 0059 03/11/21 0413 03/11/21 0725  BP: (!) 144/60 (!) 148/65 (!) 168/49 (!) 111/59  Pulse: 72 77 67 66  Resp: 18 18 19 20   Temp: 98.3 F (36.8 C) 98.2 F (36.8 C) 98.4 F (36.9 C) (!) 97.4 F (36.3 C)  TempSrc: Oral Oral Oral Oral  SpO2: 99%  99% 99% 100%  Weight:      Height:        Intake/Output Summary (Last 24 hours) at 03/11/2021 1125 Last data filed at 03/11/2021 1000 Gross per 24 hour  Intake 120 ml  Output 1800 ml  Net -1680 ml    Filed Weights   03/08/21 1112 03/10/21 1447 03/10/21 1655  Weight: 69.8 kg 70.3 kg 69 kg    Examination:  Constitutional: NAD, in bed Respiratory: CTA, no wheezing Cardiovascular: Regular rate and rhythm, no edema  Data Reviewed: I have independently reviewed following labs and imaging studies   CBC: Recent Labs  Lab 03/06/21 0935 03/08/21 0911 03/10/21 0231  WBC 5.2 4.9 5.2  NEUTROABS 3.5  --   --   HGB 9.2* 10.2* 9.8*  HCT 29.0* 30.2* 30.6*  MCV 95.7 94.1 96.5  PLT 132* 131* 137*    Basic Metabolic Panel: Recent Labs  Lab 03/06/21 0935 03/08/21 0834 03/10/21 0231  NA 136 136 137  K 4.2 4.3 4.3  CL 97* 96* 98  CO2 25 26 25   GLUCOSE 109* 98 89  BUN 51* 40* 39*  CREATININE 9.90* 9.12* 9.01*  CALCIUM 9.5 9.7 9.7  PHOS 7.7* 6.7*  --     Liver Function Tests: Recent Labs  Lab 03/06/21 0935 03/08/21 0834  ALBUMIN 3.4* 3.3*    Coagulation Profile: No results for input(s): INR, PROTIME in the last 168 hours. HbA1C: No results for input(s): HGBA1C in the last 72 hours. CBG: No results for input(s): GLUCAP in the last 168 hours.   No results found for this or any previous visit (from the past 240 hour(s)).     Radiology Studies: No results found.   Marzetta Board, MD, PhD [facility]  Between 7 am - 7 pm I am available, please contact me via Amion (for emergencies) or Securechat (non urgent messages)  Between 7 pm - 7 am I am not available, please contact night coverage MD/APP via Amion

## 2021-03-12 MED ORDER — POLYETHYLENE GLYCOL 3350 17 G PO PACK
17.0000 g | PACK | Freq: Every day | ORAL | Status: DC | PRN
  Administered 2021-03-15: 17 g via ORAL
  Filled 2021-03-12: qty 1

## 2021-03-12 MED ORDER — BISACODYL 10 MG RE SUPP
10.0000 mg | Freq: Every day | RECTAL | Status: DC | PRN
  Administered 2021-03-12: 17:00:00 10 mg via RECTAL
  Filled 2021-03-12: qty 1

## 2021-03-12 MED ORDER — SENNOSIDES-DOCUSATE SODIUM 8.6-50 MG PO TABS
1.0000 | ORAL_TABLET | Freq: Two times a day (BID) | ORAL | Status: DC
  Administered 2021-03-12 – 2021-03-15 (×6): 1 via ORAL
  Filled 2021-03-12 (×7): qty 1

## 2021-03-12 NOTE — Progress Notes (Signed)
Patient ID: Jose Garrett., male   DOB: 01/21/1951, 71 y.o.   MRN: [id number]  PROGRESS NOTE    Jose Garrett.  [id number] DOB: 01-27-1951 DOA: 02/27/2021 PCP: [name], NP   Brief Narrative:  71 year old male with amyloidosis, chronic combined CHF, ESRD, CAD, peripheral vascular disease status post right BKA, severe AS status post TAVR presented to the hospital with increased weakness of bilateral arms, hands, fingers as well as wheelchair-bound for the past 3 weeks.  Patient has been unable to lift his arms above the shoulder which is completely new.  Imaging in the ED showed subacute spinal cord compression.  Neurosurgery and nephrology were consulted.  Assessment & Plan:   Subacute C-spine cord compression -MRI on admission showed advanced degenerative changes resulting in severe spinal canal stenosis with cord compression from C3-4 through C5-6.   -Neurosurgery following: Patient had agreed for surgical intervention but it looks like he might still be considering options.  Follow recommendations from Dr. Kathyrn Sheriff.   Chronic combined CHF -most recent 2D echo in August 2021 showed an EF of 45-50%, mild decreased LV function with global hypokinesis.  There was also grade 1 diastolic dysfunction.  RV was mildly reduced as well.   -Currently appears euvolemic.  -Volume managed by dialysis.   History of severe aortic stenosis-status post TAVR by Dr. Burt Knack in 2020.   Peripheral vascular disease -seen by vascular surgery as an outpatient, status post BKA on the right.  He has a femoropopliteal bypass graft on the left   Coronary artery disease -most recent cath was done in May 2020 which showed severe single-vessel CAD with chronic total occlusion of the RCA, with collaterals.  Mild calcific nonobstructive LCA disease.   -Continue statin. No chest pain.  Plavix held on 03/11/2021 in preparation for neurosurgery on 03/17/2021.  HTN, uncontrolled  -Blood pressure intermittently  on the higher side.  Continue amlodipine.   ESRD on HD -Continue scheduled HD per nephrology    Chronic anemia secondary to ESRD -Hemoglobin currently stable.  Monitor intermittently.  Thrombocytopenia -Questionable cause.  No signs of bleeding.  Monitor intermittently.    DVT prophylaxis: Heparin Code Status: Full Family Communication: None at bedside Disposition Plan: Status is: Inpatient  Remains inpatient appropriate because: Of severity of illness.  Need for surgical intervention and subsequent SNF placement.  Consultants: Neurosurgery/nephrology  Procedures: None  Antimicrobials: None   Subjective: Patient seen and examined at bedside.  No fever, shortness of breath, chest pain or abdominal reported.   Objective: Vitals:   03/11/21 2022 03/11/21 2351 03/12/21 0331 03/12/21 0750  BP: (!) 144/50 (!) 145/51 (!) 160/52 (!) 153/73  Pulse: 66 64 67 67  Resp: 18 18 18 16   Temp: 97.9 F (36.6 C) (!) 97.3 F (36.3 C) (!) 97.3 F (36.3 C) (!) 97.4 F (36.3 C)  TempSrc: Oral Oral Oral Oral  SpO2: 99% 100% 99% 100%  Weight:      Height:        Intake/Output Summary (Last 24 hours) at 03/12/2021 0808 Last data filed at 03/11/2021 1746 Gross per 24 hour  Intake 300 ml  Output --  Net 300 ml    Filed Weights   03/08/21 1112 03/10/21 1447 03/10/21 1655  Weight: 69.8 kg 70.3 kg 69 kg    Examination:  General exam: No distress.  Currently on room air.  Looks chronically ill and deconditioned.   Respiratory system: Decreased breath sounds at bases bilaterally with some crackles  cardiovascular  system: Currently rate controlled; S1-S2 heard  gastrointestinal system: Abdomen is distended slightly; soft and nontender.  Bowel sounds are heard extremities: Right BKA present; mild left lower extremity edema present but no clubbing    Data Reviewed: I have personally reviewed following labs and imaging studies  CBC: Recent Labs  Lab 03/06/21 0935 03/08/21 0911  03/10/21 0231  WBC 5.2 4.9 5.2  NEUTROABS 3.5  --   --   HGB 9.2* 10.2* 9.8*  HCT 29.0* 30.2* 30.6*  MCV 95.7 94.1 96.5  PLT 132* 131* 137*    Basic Metabolic Panel: Recent Labs  Lab 03/06/21 0935 03/08/21 0834 03/10/21 0231  NA 136 136 137  K 4.2 4.3 4.3  CL 97* 96* 98  CO2 25 26 25   GLUCOSE 109* 98 89  BUN 51* 40* 39*  CREATININE 9.90* 9.12* 9.01*  CALCIUM 9.5 9.7 9.7  PHOS 7.7* 6.7*  --     GFR: Estimated Creatinine Clearance: 7.7 mL/min (A) (by C-G formula based on SCr of 9.01 mg/dL (H)). Liver Function Tests: Recent Labs  Lab 03/06/21 0935 03/08/21 0834  ALBUMIN 3.4* 3.3*    No results for input(s): LIPASE, AMYLASE in the last 168 hours. No results for input(s): AMMONIA in the last 168 hours. Coagulation Profile: No results for input(s): INR, PROTIME in the last 168 hours. Cardiac Enzymes: No results for input(s): CKTOTAL, CKMB, CKMBINDEX, TROPONINI in the last 168 hours. BNP (last 3 results) No results for input(s): PROBNP in the last 8760 hours. HbA1C: No results for input(s): HGBA1C in the last 72 hours. CBG: No results for input(s): GLUCAP in the last 168 hours.  Lipid Profile: No results for input(s): CHOL, HDL, LDLCALC, TRIG, CHOLHDL, LDLDIRECT in the last 72 hours. Thyroid Function Tests: No results for input(s): TSH, T4TOTAL, FREET4, T3FREE, THYROIDAB in the last 72 hours. Anemia Panel: No results for input(s): VITAMINB12, FOLATE, FERRITIN, TIBC, IRON, RETICCTPCT in the last 72 hours.  Sepsis Labs: No results for input(s): PROCALCITON, LATICACIDVEN in the last 168 hours.  No results found for this or any previous visit (from the past 240 hour(s)).         Radiology Studies: No results found.      Scheduled Meds:  (feeding supplement) PROSource Plus  30 mL Oral TID BM   amiodarone  200 mg Oral Daily   amLODipine  10 mg Oral Daily   atorvastatin  80 mg Oral Q supper   Chlorhexidine Gluconate Cloth  6 each Topical Q0600    darbepoetin (ARANESP) injection - DIALYSIS  100 mcg Intravenous Q Mon-HD   heparin  5,000 Units Subcutaneous Q12H   lanthanum  2,000 mg Oral TID WC   pantoprazole  40 mg Oral Daily   vitamin B-12  1,000 mcg Oral QPM   Continuous Infusions:        Aline August, MD [facility] 03/12/2021, 8:08 AM

## 2021-03-12 NOTE — Progress Notes (Signed)
Patient ID: Roark Rufo., male   DOB: [birth year], 71 y.o.   MRN: [id number] S: no new c/o's  O:BP (!) 159/47 (BP Location: Left Arm)    Pulse 68    Temp 97.8 F (36.6 C) (Oral)    Resp 16    Ht 5\' 10"  (1.778 m)    Wt 69 kg    SpO2 98%    BMI 21.83 kg/m   Intake/Output Summary (Last 24 hours) at 03/12/2021 1230 Last data filed at 03/12/2021 0954 Gross per 24 hour  Intake 120 ml  Output --  Net 120 ml     Intake/Output: I/O last 3 completed shifts: In: 300 [P.O.:300] Out: -   Intake/Output this shift:  Total I/O In: 60 [P.O.:60] Out: -  Weight change:  Gen: NAD, laying flat in bed CVS: RRR Resp:CTA Abd: +BS, soft, NT/nd Ext: rt bka, no edema Neuro: awake, alert Dialysis access: R AVF +T/B  OP HD: MWF [facility]  4h  400/1.5  71.9kg  2/2 bath  Hep 2400  RUA AVF Mircera 75 mcg q4wks - last 02/15/21 Calcitriol 59mcg PO qHD-last 02/27/21      Assessment/Plan: Spinal Cord Compression-imaging shows advanced degenerative changes and spinal canal stenosis with cord compression. Neurosurgery consulted. C-spine surgery planned for 03/17/21.  ESRD - On HD per usual MWF schedule. HD today. Pt quite stable, will see again on Sunday.  Hypertension/volume- continue on norvasc here. No vol excess on exam, under dry wt 2-3kg.  Small UFG 1 L w/ HD tomorrow.  Anemia of CKD - Hgb down to 8.7 12/23, resumed esa here-first dose 12/23 Secondary Hyperparathyroidism - on lanthanum Nutrition - Renal diet with fluid restriction; protein supplements  [name], MD 03/06/2021, 11:55 AM     Studies/Results: No results found.  (feeding supplement) PROSource Plus  30 mL Oral TID BM   amiodarone  200 mg Oral Daily   amLODipine  10 mg Oral Daily   atorvastatin  80 mg Oral Q supper   Chlorhexidine Gluconate Cloth  6 each Topical Q0600   darbepoetin (ARANESP) injection - DIALYSIS  100 mcg Intravenous Q Mon-HD   heparin  5,000 Units Subcutaneous Q12H   lanthanum  2,000 mg Oral TID WC   pantoprazole   40 mg Oral Daily   vitamin B-12  1,000 mcg Oral QPM

## 2021-03-12 NOTE — Plan of Care (Signed)
°  Problem: Education: Goal: Knowledge of General Education information will improve Description: Including pain rating scale, medication(s)/side effects and non-pharmacologic comfort measures Outcome: Progressing   Problem: Health Behavior/Discharge Planning: Goal: Ability to manage health-related needs will improve Outcome: Progressing   Problem: Clinical Measurements: Goal: Ability to maintain clinical measurements within normal limits will improve Outcome: Progressing   Problem: Activity: Goal: Risk for activity intolerance will decrease Outcome: Progressing   Problem: Nutrition: Goal: Adequate nutrition will be maintained Outcome: Progressing   Problem: Coping: Goal: Level of anxiety will decrease Outcome: Progressing   Problem: Safety: Goal: Ability to remain free from injury will improve Outcome: Progressing   

## 2021-03-13 LAB — RENAL FUNCTION PANEL
Albumin: 3.5 g/dL (ref 3.5–5.0)
Anion gap: 18 — ABNORMAL HIGH (ref 5–15)
BUN: 58 mg/dL — ABNORMAL HIGH (ref 8–23)
CO2: 23 mmol/L (ref 22–32)
Calcium: 9.3 mg/dL (ref 8.9–10.3)
Chloride: 93 mmol/L — ABNORMAL LOW (ref 98–111)
Creatinine, Ser: 11.37 mg/dL — ABNORMAL HIGH (ref 0.61–1.24)
GFR, Estimated: 4 mL/min — ABNORMAL LOW (ref >60)
Glucose, Bld: 89 mg/dL (ref 70–99)
Phosphorus: 8 mg/dL — ABNORMAL HIGH (ref 2.5–4.6)
Potassium: 5.1 mmol/L (ref 3.5–5.1)
Sodium: 134 mmol/L — ABNORMAL LOW (ref 135–145)

## 2021-03-13 LAB — CBC
HCT: 32.7 % — ABNORMAL LOW (ref 39.0–52.0)
Hemoglobin: 10.4 g/dL — ABNORMAL LOW (ref 13.0–17.0)
MCH: 30.7 pg (ref 26.0–34.0)
MCHC: 31.8 g/dL (ref 30.0–36.0)
MCV: 96.5 fL (ref 80.0–100.0)
Platelets: 140 10*3/uL — ABNORMAL LOW (ref 150–400)
RBC: 3.39 MIL/uL — ABNORMAL LOW (ref 4.22–5.81)
RDW: 15.4 % (ref 11.5–15.5)
WBC: 5.7 10*3/uL (ref 4.0–10.5)
nRBC: 0 % (ref 0.0–0.2)

## 2021-03-13 MED ORDER — HEPARIN SODIUM (PORCINE) 1000 UNIT/ML DIALYSIS
1200.0000 [IU] | INTRAMUSCULAR | Status: AC | PRN
  Administered 2021-03-13 (×2): 1200 [IU] via INTRAVENOUS_CENTRAL
  Filled 2021-03-13 (×5): qty 2

## 2021-03-13 MED ORDER — CALCITRIOL 0.25 MCG PO CAPS
1.0000 ug | ORAL_CAPSULE | ORAL | Status: DC
  Administered 2021-03-13 – 2021-03-22 (×3): 1 ug via ORAL
  Filled 2021-03-13: qty 2
  Filled 2021-03-13 (×3): qty 4

## 2021-03-13 NOTE — Progress Notes (Signed)
Patient ID: Jose Vanderhoof., male   DOB: Nov 02, 1950, 71 y.o.   MRN: [id number]  PROGRESS NOTE    Jose Cramp.  [id number] DOB: [birth year] DOA: 02/27/2021 PCP: [name], NP   Brief Narrative:  71 year old male with amyloidosis, chronic combined CHF, ESRD, CAD, peripheral vascular disease status post right BKA, severe AS status post TAVR presented to the hospital with increased weakness of bilateral arms, hands, fingers as well as wheelchair-bound for the past 3 weeks.  Patient has been unable to lift his arms above the shoulder which is completely new.  Imaging in the ED showed subacute spinal cord compression.  Neurosurgery and nephrology were consulted.  Assessment & Plan:   Subacute C-spine cord compression -MRI on admission showed advanced degenerative changes resulting in severe spinal canal stenosis with cord compression from C3-4 through C5-6.   -Neurosurgery following: Currently tentatively planned for surgery on 03/17/2021.  Chronic combined CHF -most recent 2D echo in August 2021 showed an EF of 45-50%, mild decreased LV function with global hypokinesis.  There was also grade 1 diastolic dysfunction.  RV was mildly reduced as well.   -Currently appears euvolemic.  -Volume managed by dialysis.   History of severe aortic stenosis-status post TAVR by Dr. Burt Knack in 2020.   Peripheral vascular disease -seen by vascular surgery as an outpatient, status post BKA on the right.  He has a femoropopliteal bypass graft on the left   Coronary artery disease -most recent cath was done in May 2020 which showed severe single-vessel CAD with chronic total occlusion of the RCA, with collaterals.  Mild calcific nonobstructive LCA disease.   -Continue statin. No chest pain.  Plavix held on 03/11/2021 in preparation for neurosurgery on 03/17/2021.  HTN, uncontrolled  -Blood pressure intermittently on the higher side.  Continue amlodipine.   ESRD on HD -Continue scheduled HD per  nephrology    Chronic anemia secondary to ESRD -Hemoglobin currently stable.  Monitor intermittently.  Thrombocytopenia -Questionable cause.  No signs of bleeding.  Monitor intermittently.    DVT prophylaxis: Heparin Code Status: Full Family Communication: None at bedside Disposition Plan: Status is: Inpatient  Remains inpatient appropriate because: Of severity of illness.  Need for surgical intervention and subsequent SNF placement.  Consultants: Neurosurgery/nephrology  Procedures: None  Antimicrobials: None   Subjective: Patient seen and examined at bedside undergoing hemodialysis.  Denies worsening shortness of breath, fever, vomiting.   Objective: Vitals:   03/13/21 0701 03/13/21 0714 03/13/21 0730 03/13/21 0800  BP: (!) 170/70 (!) 158/68 (!) 162/67 (!) 148/63  Pulse: 67 64 62 65  Resp: 16     Temp: 97.6 F (36.4 C)     TempSrc: Oral     SpO2: 98%     Weight: 69.5 kg     Height:        Intake/Output Summary (Last 24 hours) at 03/13/2021 0820 Last data filed at 03/12/2021 2135 Gross per 24 hour  Intake 300 ml  Output --  Net 300 ml    Filed Weights   03/10/21 1447 03/10/21 1655 03/13/21 0701  Weight: 70.3 kg 69 kg 69.5 kg    Examination:  General exam: On room air currently.  No acute distress.  Looks chronically ill and deconditioned.   Respiratory system: Bilateral decreased breath sounds at bases with scattered crackles  cardiovascular system: S1-S2 heard; rate controlled currently gastrointestinal system: Abdomen is mildly distended; soft and nontender.  Normal bowel sounds heard extremities: Right BKA present; trace left lower  extremity edema present without any cyanosis  Data Reviewed: I have personally reviewed following labs and imaging studies  CBC: Recent Labs  Lab 03/06/21 0935 03/08/21 0911 03/10/21 0231 03/13/21 0725  WBC 5.2 4.9 5.2 5.7  NEUTROABS 3.5  --   --   --   HGB 9.2* 10.2* 9.8* 10.4*  HCT 29.0* 30.2* 30.6* 32.7*  MCV  95.7 94.1 96.5 96.5  PLT 132* 131* 137* 140*    Basic Metabolic Panel: Recent Labs  Lab 03/06/21 0935 03/08/21 0834 03/10/21 0231 03/13/21 0727  NA 136 136 137 134*  K 4.2 4.3 4.3 5.1  CL 97* 96* 98 93*  CO2 25 26 25 23   GLUCOSE 109* 98 89 89  BUN 51* 40* 39* 58*  CREATININE 9.90* 9.12* 9.01* 11.37*  CALCIUM 9.5 9.7 9.7 9.3  PHOS 7.7* 6.7*  --  8.0*    GFR: Estimated Creatinine Clearance: 6.1 mL/min (A) (by C-G formula based on SCr of 11.37 mg/dL (H)). Liver Function Tests: Recent Labs  Lab 03/06/21 0935 03/08/21 0834 03/13/21 0727  ALBUMIN 3.4* 3.3* 3.5    No results for input(s): LIPASE, AMYLASE in the last 168 hours. No results for input(s): AMMONIA in the last 168 hours. Coagulation Profile: No results for input(s): INR, PROTIME in the last 168 hours. Cardiac Enzymes: No results for input(s): CKTOTAL, CKMB, CKMBINDEX, TROPONINI in the last 168 hours. BNP (last 3 results) No results for input(s): PROBNP in the last 8760 hours. HbA1C: No results for input(s): HGBA1C in the last 72 hours. CBG: No results for input(s): GLUCAP in the last 168 hours.  Lipid Profile: No results for input(s): CHOL, HDL, LDLCALC, TRIG, CHOLHDL, LDLDIRECT in the last 72 hours. Thyroid Function Tests: No results for input(s): TSH, T4TOTAL, FREET4, T3FREE, THYROIDAB in the last 72 hours. Anemia Panel: No results for input(s): VITAMINB12, FOLATE, FERRITIN, TIBC, IRON, RETICCTPCT in the last 72 hours.  Sepsis Labs: No results for input(s): PROCALCITON, LATICACIDVEN in the last 168 hours.  No results found for this or any previous visit (from the past 240 hour(s)).         Radiology Studies: No results found.      Scheduled Meds:  (feeding supplement) PROSource Plus  30 mL Oral TID BM   amiodarone  200 mg Oral Daily   amLODipine  10 mg Oral Daily   atorvastatin  80 mg Oral Q supper   calcitRIOL  1 mcg Oral Q M,W,F-HD   Chlorhexidine Gluconate Cloth  6 each Topical  Q0600   darbepoetin (ARANESP) injection - DIALYSIS  100 mcg Intravenous Q Mon-HD   heparin  5,000 Units Subcutaneous Q12H   lanthanum  2,000 mg Oral TID WC   pantoprazole  40 mg Oral Daily   senna-docusate  1 tablet Oral BID   vitamin B-12  1,000 mcg Oral QPM   Continuous Infusions:        Aline August, MD [facility] 03/13/2021, 8:20 AM

## 2021-03-13 NOTE — Progress Notes (Signed)
°   03/13/21 1050  Vitals  Temp 97.8 F (36.6 C)  Temp Source Oral  BP (!) 137/55  BP Location Left Arm  BP Method Automatic  Patient Position (if appropriate) Lying  Pulse Rate 64  Pulse Rate Source Monitor  Resp 17  Oxygen Therapy  SpO2 100 %  O2 Device Room Air  Pain Assessment  Pain Scale 0-10  Pain Score 0  Dialysis Weight  Weight 68.1 kg  Type of Weight Post-Dialysis  Post-Hemodialysis Assessment  Rinseback Volume (mL) 250 mL  KECN 268 V  Dialyzer Clearance Lightly streaked  Duration of HD Treatment -hour(s) 3.5 hour(s)  Hemodialysis Intake (mL) 500 mL  UF Total -Machine (mL) 1500 mL  Net UF (mL) 1000 mL  Tolerated HD Treatment Yes  Post-Hemodialysis Comments tx complete-pt stable  AVG/AVF Arterial Site Held (minutes) 7 minutes  AVG/AVF Venous Site Held (minutes) 7 minutes  Fistula / Graft Right Forearm Arteriovenous fistula  Placement Date/Time: 12/27/15 0849   Placed prior to admission: No  Orientation: Right  Access Location: Forearm  Access Type: Arteriovenous fistula  Site Condition No complications  Fistula / Graft Assessment Present;Thrill;Bruit  Status Deaccessed   HD tx complete-pt stable. No problems noted throughout tx.

## 2021-03-13 NOTE — Progress Notes (Signed)
Nephrology Progress Note:   Patient ID: Jose Garrett., male   DOB: 29-Jan-1951, 71 y.o.   MRN: [id number]  Subjective:  Seen and examined on dialysis.  Procedure supervised.  Blood pressure  162/67 and HR 62.  Right AVF in use.  Tolerating goal.   Prior to today his last HD on 12/30 with 1.8 kg UF.  Shares plans are still for surgery on 1/6 as far as he knows and we discussed arranging HD so that he doesn't have HD on the day of his procedure  Review of systems:  Hands "drawing up" and numb Denies shortness of breath  Reports mild nausea   O:BP (!) 170/70 (BP Location: Left Arm)    Pulse 67    Temp 97.6 F (36.4 C) (Oral)    Resp 16    Ht 5\' 10"  (1.778 m)    Wt 69.5 kg    SpO2 98%    BMI 21.98 kg/m   Intake/Output Summary (Last 24 hours) at 03/13/2021 0726 Last data filed at 03/12/2021 2135 Gross per 24 hour  Intake 300 ml  Output --  Net 300 ml    Intake/Output: I/O last 3 completed shifts: In: 300 [P.O.:300] Out: -   Intake/Output this shift:  No intake/output data recorded. Weight change:  Gen: NAD, laying in bed in NAD CVS: S1S2 no rub Resp:CTA and normal work of breathing on room air  Abd: soft, NT/nd Ext: rt bka, no edema Neuro: awake, alert and oriented x 3  Psych Normal mood and affect Dialysis access: R AVF in use  OP HD: MWF [facility]  4h  400/1.5  71.9kg  2/2 bath  Hep 2400  RUA AVF Mircera 75 mcg q4wks - last 02/15/21 Calcitriol 75mcg PO qHD-last 02/27/21      Assessment/Plan: Spinal Cord Compression-imaging shows advanced degenerative changes and spinal canal stenosis with cord compression. Neurosurgery consulted. C-spine surgery planned for 03/17/21  ESRD - On HD per usual MWF schedule.  Later this week we are able to do HD off schedule to accommodate surgery above (I.e. 1/5 vs 1/7 depending on volume status and labs). Await updated labs.  Hypertension/volume- continue on norvasc here. No vol excess on exam, under dry wt 2-3kg.   Anemia of CKD - continue ESA -  aranesp 100 mcg every monday. Await updated labs  Secondary Hyperparathyroidism - hyperphos - on lanthanum. Resume calcitriol     Studies/Results: No results found.  (feeding supplement) PROSource Plus  30 mL Oral TID BM   amiodarone  200 mg Oral Daily   amLODipine  10 mg Oral Daily   atorvastatin  80 mg Oral Q supper   Chlorhexidine Gluconate Cloth  6 each Topical Q0600   darbepoetin (ARANESP) injection - DIALYSIS  100 mcg Intravenous Q Mon-HD   heparin  5,000 Units Subcutaneous Q12H   lanthanum  2,000 mg Oral TID WC   pantoprazole  40 mg Oral Daily   senna-docusate  1 tablet Oral BID   vitamin B-12  1,000 mcg Oral QPM     Claudia Desanctis, MD 03/13/2021 7:42 AM

## 2021-03-14 ENCOUNTER — Ambulatory Visit: Payer: Medicare Other | Admitting: Physical Therapy

## 2021-03-14 ENCOUNTER — Ambulatory Visit: Payer: Medicare Other | Admitting: Occupational Therapy

## 2021-03-14 LAB — RENAL FUNCTION PANEL
Albumin: 3.5 g/dL (ref 3.5–5.0)
Anion gap: 12 (ref 5–15)
BUN: 27 mg/dL — ABNORMAL HIGH (ref 8–23)
CO2: 28 mmol/L (ref 22–32)
Calcium: 9.8 mg/dL (ref 8.9–10.3)
Chloride: 96 mmol/L — ABNORMAL LOW (ref 98–111)
Creatinine, Ser: 7.5 mg/dL — ABNORMAL HIGH (ref 0.61–1.24)
GFR, Estimated: 7 mL/min — ABNORMAL LOW (ref >60)
Glucose, Bld: 105 mg/dL — ABNORMAL HIGH (ref 70–99)
Phosphorus: 5.5 mg/dL — ABNORMAL HIGH (ref 2.5–4.6)
Potassium: 4.3 mmol/L (ref 3.5–5.1)
Sodium: 136 mmol/L (ref 135–145)

## 2021-03-14 MED ORDER — HYDRALAZINE HCL 25 MG PO TABS
25.0000 mg | ORAL_TABLET | Freq: Two times a day (BID) | ORAL | Status: DC
  Administered 2021-03-14 (×2): 25 mg via ORAL
  Filled 2021-03-14 (×2): qty 1

## 2021-03-14 MED ORDER — CHLORHEXIDINE GLUCONATE CLOTH 2 % EX PADS
6.0000 | MEDICATED_PAD | Freq: Every day | CUTANEOUS | Status: DC
  Administered 2021-03-16 – 2021-03-17 (×2): 6 via TOPICAL

## 2021-03-14 NOTE — Progress Notes (Signed)
°  NEUROSURGERY PROGRESS NOTE   Pt remains unchanged, cont to c/o decreased strength BUE, numbness and loss of function in both hands. Remains medically stable, plan on surgery 1/6. Remains off plavix.  All questions today were answered, he remains willing to proceed with surgery.  Jose Lose, MD [facility]

## 2021-03-14 NOTE — Progress Notes (Signed)
Physical Therapy Treatment Patient Details Name: Jose Garrett. MRN: [id number] DOB: Apr 23, 1950 Today's Date: 03/14/2021   History of Present Illness 71 y.o. m admitted on 02/27/21 due to increased weakness of BUE weakness and has become WC bound in the past 3 weeks due to weakness. MRI shows subacute c-spine cord compression, which he is not a surgical candidate for. PMH significant for amyloidosis, chronic combined CHF, ESRD, CAD, peripheral vascular disease status post right BKA, severe AS status post TAVR.    PT Comments    Pt received in supine, agreeable to therapy session and with good participation and tolerance for transfer training. Pt with fair static seated balance but poor dynamic seated balance and standing balance. Pt demonstrates scissoring steps while step pivoting to chair and RN notified he would be safer to perform seated scoot transfer back to bed when ready to return from drop arm recliner. He needs +2 mod to maxA for transfers and dynamic standing tasks. Pt having difficulty gripping RW handle on Rt side with pivotal steps ended up giving him HHA toward end of trial with forearm support, may trial alternate AD with B forearm platform next session to see if this helps with standing stability? Pt continues to benefit from PT services to progress toward functional mobility goals.   Recommendations for follow up therapy are one component of a multi-disciplinary discharge planning process, led by the attending physician.  Recommendations may be updated based on patient status, additional functional criteria and insurance authorization.  Follow Up Recommendations  Skilled nursing-short term rehab (<3 hours/day)     Assistance Recommended at Discharge Frequent or constant Supervision/Assistance  Patient can return home with the following Two people to help with walking and/or transfers;A lot of help with bathing/dressing/bathroom;Assistance with cooking/housework;Assist for  transportation;Help with stairs or ramp for entrance   Equipment Recommendations  None recommended by PT    Recommendations for Other Services       Precautions / Restrictions Precautions Precautions: Fall Restrictions Weight Bearing Restrictions: No     Mobility  Bed Mobility Overal bed mobility: Needs Assistance Bed Mobility: Rolling;Sidelying to Sit Rolling: Supervision Sidelying to sit: Mod assist       General bed mobility comments: mod A from a semi-flat surface. HOB elevated slightly. verbal cues for sequencing; rolling is more laborious and pt requiring increased time and cues for technique    Transfers Overall transfer level: Needs assistance Equipment used: Rolling walker (2 wheels);2 person hand held assist Transfers: Sit to/from Stand;Bed to chair/wheelchair/BSC Sit to Stand: Mod assist;+2 physical assistance;+2 safety/equipment     Step pivot transfers: Max assist;+2 physical assistance;+2 safety/equipment     General transfer comment: cues for hand placement and sequencing. sit<>stand 2x with less physical assist required for the 2nd trial from recliner. Pt with poor foot placement while pivoting and needs totalA to manage RW, scissoring steps while pivoting causing frequent LOB needs maxA +2 while stepping to chair    Ambulation/Gait             Pre-gait activities: pt scissoring with pivotal steps to chair and very fatigued, unable to safely progress pre-gait tasks after initial pivot     Stairs             Wheelchair Mobility    Modified Rankin (Stroke Patients Only)       Balance Overall balance assessment: Needs assistance Sitting-balance support: Feet supported Sitting balance-Leahy Scale: Fair Sitting balance - Comments: needs some minA trunk support for dynamic  tasks including donning sleeve for prosthesis due to posterior LOB   Standing balance support: Bilateral upper extremity supported;Reliant on assistive device for  balance Standing balance-Leahy Scale: Poor Standing balance comment: +2 external assist for standing                            Cognition Arousal/Alertness: Awake/alert Behavior During Therapy: WFL for tasks assessed/performed Overall Cognitive Status: Within Functional Limits for tasks assessed                                 General Comments: Pt internally distracted, often changes the subject during education and tangential. Needs redirection to task but participatory as able with good following of 1-step cues.        Exercises Other Exercises Other Exercises: seated BLE AROM: hip flexion, LAQ x10 reps ea Other Exercises: verbal review for supine hip abduction, quad sets and ankle pumps, pt agreeable to attempt may need HEP to reinforce next session    General Comments General comments (skin integrity, edema, etc.): VSS on RA, no acute s/sx distress throughout      Pertinent Vitals/Pain Pain Assessment: Faces Faces Pain Scale: Hurts a little bit Pain Location: back > generalized Pain Descriptors / Indicators: Grimacing Pain Intervention(s): Limited activity within patient's tolerance;Monitored during session;Repositioned     PT Goals (current goals can now be found in the care plan section) Acute Rehab PT Goals Patient Stated Goal: agreeable to SNF PT Goal Formulation: With patient Time For Goal Achievement: 03/14/21 Progress towards PT goals: Progressing toward goals    Frequency    Min 2X/week      PT Plan Current plan remains appropriate    Co-evaluation PT/OT/SLP Co-Evaluation/Treatment: Yes Reason for Co-Treatment: Complexity of the patient's impairments (multi-system involvement);Necessary to address cognition/behavior during functional activity;For patient/therapist safety;To address functional/ADL transfers PT goals addressed during session: Mobility/safety with mobility;Proper use of DME;Balance;Strengthening/ROM OT goals  addressed during session: ADL's and self-care;Proper use of Adaptive equipment and DME      AM-PAC PT "6 Clicks" Mobility   Outcome Measure  Help needed turning from your back to your side while in a flat bed without using bedrails?: A Little Help needed moving from lying on your back to sitting on the side of a flat bed without using bedrails?: A Lot Help needed moving to and from a bed to a chair (including a wheelchair)?: A Lot Help needed standing up from a chair using your arms (e.g., wheelchair or bedside chair)?: A Lot Help needed to walk in hospital room?: Total Help needed climbing 3-5 steps with a railing? : Total 6 Click Score: 11    End of Session Equipment Utilized During Treatment: Gait belt Activity Tolerance: Patient tolerated treatment well Patient left: in chair;with call bell/phone within reach;Other (comment);with chair alarm set (prosthesis on) Nurse Communication: Mobility status;Other (comment) (he would do better with a lateral scoot transfer to return to bed, his recliner chair isn't locking well he needs a new one for safety.) PT Visit Diagnosis: Other abnormalities of gait and mobility (R26.89);Muscle weakness (generalized) (M62.81)     Time: 2671-2458 PT Time Calculation (min) (ACUTE ONLY): 25 min  Charges:  $Therapeutic Exercise: 8-22 mins                     Shikha Bibb P., PTA Acute Rehabilitation Services Pager: [phone] Office: [phone]  Lawren Sexson M Wilton Thrall 03/14/2021, 1:20 PM

## 2021-03-14 NOTE — Progress Notes (Signed)
Nephrology Progress Note:   Patient ID: Jose Garrett., male   DOB: [birth year], 71 y.o.   MRN: [id number]  Subjective:  Last HD on 1/2 with 1 kg UF.  Plans are still for surgery on 1/6 he states.  Doesn't feel great at end of HD but no cramping.  He used to take hydralazine.   Review of systems:  Hands still numb Denies shortness of breath or chest pain Denies n/v   O:BP (!) 165/53 (BP Location: Left Arm)    Pulse 68    Temp 98 F (36.7 C) (Oral)    Resp 16    Ht 5\' 10"  (1.778 m)    Wt 68.1 kg    SpO2 100%    BMI 21.54 kg/m   Intake/Output Summary (Last 24 hours) at 03/14/2021 0657 Last data filed at 03/13/2021 1050 Gross per 24 hour  Intake --  Output 1000 ml  Net -1000 ml    Intake/Output: I/O last 3 completed shifts: In: 300 [P.O.:300] Out: 1000 [Other:1000]  Intake/Output this shift:  No intake/output data recorded. Weight change:   Physical exam:  Gen: NAD, laying in bed in NAD  CVS: S1S2 no rub Resp:CTA and normal work of breathing on room air  Abd: soft, NT/nd Ext: rt bka, no edema Neuro: awake, alert and oriented x 3  Psych Normal mood and affect Dialysis access: R AVF with bruit and thrill   OP HD: MWF [facility]  4h  400/1.5  71.9kg  2/2 bath  Hep 2400  RUA AVF Mircera 75 mcg q4wks - last 02/15/21 Calcitriol 20mcg PO qHD-last 02/27/21      Assessment/Plan: Spinal Cord Compression-imaging shows advanced degenerative changes and spinal canal stenosis with cord compression. Neurosurgery consulted. C-spine surgery planned for 03/17/21  ESRD - has been HD per usual MWF schedule.  Plan for next HD on 1/4.  Later this week we are able to do HD off schedule to accommodate surgery above (anticipate holding HD on 1/6 and doing HD on 1/7 depending on volume status and labs).  Renal panel ordered Hypertension/volume- continue on norvasc here. No vol excess on exam, under dry wt 2-3kg.  Unable to really lower further - feel he would not tolerate.  Start hydralazine 25 mg BID for  now Anemia of CKD - continue ESA - aranesp 100 mcg every monday Secondary Hyperparathyroidism - hyperphos - on lanthanum. Resumed calcitriol   Disposition - per primary team - awaiting surgery on 1/6 per pt  Studies/Results: No results found.  (feeding supplement) PROSource Plus  30 mL Oral TID BM   amiodarone  200 mg Oral Daily   amLODipine  10 mg Oral Daily   atorvastatin  80 mg Oral Q supper   calcitRIOL  1 mcg Oral Q M,W,F-HD   Chlorhexidine Gluconate Cloth  6 each Topical Q0600   darbepoetin (ARANESP) injection - DIALYSIS  100 mcg Intravenous Q Mon-HD   heparin  5,000 Units Subcutaneous Q12H   lanthanum  2,000 mg Oral TID WC   pantoprazole  40 mg Oral Daily   senna-docusate  1 tablet Oral BID   vitamin B-12  1,000 mcg Oral QPM     Claudia Desanctis, MD 03/14/2021 7:10 AM

## 2021-03-14 NOTE — Progress Notes (Signed)
Patient ID: Jose Garrett., male   DOB: [birth year], 71 y.o.   MRN: [id number]  PROGRESS NOTE    Jose Garrett.  NGE:[id number] DOB: 12/18/1950 DOA: 02/27/2021 PCP: [name], NP   Brief Narrative:  71 year old male with amyloidosis, chronic combined CHF, ESRD, CAD, peripheral vascular disease status post right BKA, severe AS status post TAVR presented to the hospital with increased weakness of bilateral arms, hands, fingers as well as wheelchair-bound for the past 3 weeks.  Patient has been unable to lift his arms above the shoulder which is completely new.  Imaging in the ED showed subacute spinal cord compression.  Neurosurgery and nephrology were consulted.  Assessment & Plan:   Subacute C-spine cord compression -MRI on admission showed advanced degenerative changes resulting in severe spinal canal stenosis with cord compression from C3-4 through C5-6.   -Neurosurgery following: Currently tentatively planned for surgery on 03/17/2021.  Chronic combined CHF -most recent 2D echo in August 2021 showed an EF of 45-50%, mild decreased LV function with global hypokinesis.  There was also grade 1 diastolic dysfunction.  RV was mildly reduced as well.   -Currently appears euvolemic.  -Volume managed by dialysis.   History of severe aortic stenosis-status post TAVR by Dr. Burt Knack in 2020.   Peripheral vascular disease -seen by vascular surgery as an outpatient, status post BKA on the right.  He has a femoropopliteal bypass graft on the left   Coronary artery disease -most recent cath was done in May 2020 which showed severe single-vessel CAD with chronic total occlusion of the RCA, with collaterals.  Mild calcific nonobstructive LCA disease.   -Continue statin. No chest pain.  Plavix held on 03/11/2021 in preparation for neurosurgery on 03/17/2021.  HTN, uncontrolled  -Blood pressure intermittently on the higher side.  Continue amlodipine.  Hydralazine being added by nephrology.   ESRD  on HD -Continue scheduled HD per nephrology    Chronic anemia secondary to ESRD -Hemoglobin currently stable.  Monitor intermittently.  Thrombocytopenia -Questionable cause.  No signs of bleeding.  Monitor intermittently.    DVT prophylaxis: Heparin Code Status: Full Family Communication: None at bedside Disposition Plan: Status is: Inpatient  Remains inpatient appropriate because: Of severity of illness.  Need for surgical intervention and subsequent SNF placement.  Consultants: Neurosurgery/nephrology  Procedures: None  Antimicrobials: None   Subjective: Patient seen and examined at bedside.  No overnight fever, vomiting, abdominal pain reported.   Objective: Vitals:   03/13/21 1951 03/13/21 2337 03/14/21 0343 03/14/21 0803  BP: (!) 162/46 (!) 159/44 (!) 165/53 (!) 155/53  Pulse: 67 65 68 62  Resp: 16 20 16 17   Temp: 98.1 F (36.7 C) 98 F (36.7 C) 98 F (36.7 C) 97.7 F (36.5 C)  TempSrc: Oral Oral Oral Oral  SpO2: 100% 99% 100% 100%  Weight:      Height:        Intake/Output Summary (Last 24 hours) at 03/14/2021 0812 Last data filed at 03/13/2021 1050 Gross per 24 hour  Intake --  Output 1000 ml  Net -1000 ml    Filed Weights   03/10/21 1655 03/13/21 0701 03/13/21 1050  Weight: 69 kg 69.5 kg 68.1 kg    Examination:  General exam: No distress.  On room air currently.  Looks chronically ill and deconditioned.   Respiratory system: Decreased breath sounds at bases bilaterally with some crackles  cardiovascular system: Currently rate controlled; S1-S2 heard gastrointestinal system: Abdomen is distended slightly; soft and nontender.  Bowel sounds are heard  extremities: Right BKA present; mild left lower extremity edema present Data Reviewed: I have personally reviewed following labs and imaging studies  CBC: Recent Labs  Lab 03/08/21 0911 03/10/21 0231 03/13/21 0725  WBC 4.9 5.2 5.7  HGB 10.2* 9.8* 10.4*  HCT 30.2* 30.6* 32.7*  MCV 94.1 96.5 96.5   PLT 131* 137* 140*    Basic Metabolic Panel: Recent Labs  Lab 03/08/21 0834 03/10/21 0231 03/13/21 0727  NA 136 137 134*  K 4.3 4.3 5.1  CL 96* 98 93*  CO2 26 25 23   GLUCOSE 98 89 89  BUN 40* 39* 58*  CREATININE 9.12* 9.01* 11.37*  CALCIUM 9.7 9.7 9.3  PHOS 6.7*  --  8.0*    GFR: Estimated Creatinine Clearance: 6 mL/min (A) (by C-G formula based on SCr of 11.37 mg/dL (H)). Liver Function Tests: Recent Labs  Lab 03/08/21 0834 03/13/21 0727  ALBUMIN 3.3* 3.5    No results for input(s): LIPASE, AMYLASE in the last 168 hours. No results for input(s): AMMONIA in the last 168 hours. Coagulation Profile: No results for input(s): INR, PROTIME in the last 168 hours. Cardiac Enzymes: No results for input(s): CKTOTAL, CKMB, CKMBINDEX, TROPONINI in the last 168 hours. BNP (last 3 results) No results for input(s): PROBNP in the last 8760 hours. HbA1C: No results for input(s): HGBA1C in the last 72 hours. CBG: No results for input(s): GLUCAP in the last 168 hours.  Lipid Profile: No results for input(s): CHOL, HDL, LDLCALC, TRIG, CHOLHDL, LDLDIRECT in the last 72 hours. Thyroid Function Tests: No results for input(s): TSH, T4TOTAL, FREET4, T3FREE, THYROIDAB in the last 72 hours. Anemia Panel: No results for input(s): VITAMINB12, FOLATE, FERRITIN, TIBC, IRON, RETICCTPCT in the last 72 hours.  Sepsis Labs: No results for input(s): PROCALCITON, LATICACIDVEN in the last 168 hours.  No results found for this or any previous visit (from the past 240 hour(s)).         Radiology Studies: No results found.      Scheduled Meds:  (feeding supplement) PROSource Plus  30 mL Oral TID BM   amiodarone  200 mg Oral Daily   amLODipine  10 mg Oral Daily   atorvastatin  80 mg Oral Q supper   calcitRIOL  1 mcg Oral Q M,W,F-HD   Chlorhexidine Gluconate Cloth  6 each Topical Q0600   darbepoetin (ARANESP) injection - DIALYSIS  100 mcg Intravenous Q Mon-HD   heparin  5,000  Units Subcutaneous Q12H   hydrALAZINE  25 mg Oral BID   lanthanum  2,000 mg Oral TID WC   pantoprazole  40 mg Oral Daily   senna-docusate  1 tablet Oral BID   vitamin B-12  1,000 mcg Oral QPM   Continuous Infusions:        Aline August, MD [facility] 03/14/2021, 8:12 AM

## 2021-03-14 NOTE — Progress Notes (Signed)
Occupational Therapy Treatment Patient Details Name: Jose Garrett. MRN: [id number] DOB: 05-15-1950 Today's Date: 03/14/2021   History of present illness 71 y.o. m admitted on 02/27/21 due to increased weakness of BUE weakness and has become WC bound in the past 3 weeks due to weakness. MRI shows subacute c-spine cord compression, which he is not a surgical candidate for. PMH significant for amyloidosis, chronic combined CHF, ESRD, CAD, peripheral vascular disease status post right BKA, severe AS status post TAVR.   OT comments  Jose Garrett is progressing incrementally towards his updated acute goals, see care plan. Session completed in conjunction with PT to progress OOB transfers. Overall pt required max A +2 to complete LB dressing this session and to take pivotal steps with the RW from the bed>chair. Pt required simple and direct cues for sequencing, and is often self-distracting with poor attention to task. Pt was able to sit<>stand from the chair with less assist required, but poor tolerance for standing with multiple knee buckles. D/c plan remains appropriate, OT to continue to follow acutely.    Recommendations for follow up therapy are one component of a multi-disciplinary discharge planning process, led by the attending physician.  Recommendations may be updated based on patient status, additional functional criteria and insurance authorization.    Follow Up Recommendations  Skilled nursing-short term rehab (<3 hours/day)    Assistance Recommended at Discharge Frequent or constant Supervision/Assistance  Patient can return home with the following  A lot of help with walking and/or transfers;A lot of help with bathing/dressing/bathroom;Direct supervision/assist for medications management;Assist for transportation;Help with stairs or ramp for entrance   Equipment Recommendations  None recommended by OT       Precautions / Restrictions Precautions Precautions: Fall Restrictions Weight  Bearing Restrictions: No       Mobility Bed Mobility Overal bed mobility: Needs Assistance Bed Mobility: Rolling;Sidelying to Sit Rolling: Supervision Sidelying to sit: Mod assist       General bed mobility comments: mod A from a semi-flat surface. HOM elevated slightly. verbal cues for sequencing    Transfers Overall transfer level: Needs assistance Equipment used: Rolling walker (2 wheels);2 person hand held assist Transfers: Sit to/from Stand;Bed to chair/wheelchair/BSC Sit to Stand: Mod assist;+2 physical assistance;+2 safety/equipment   Step pivot transfers: Max assist;+2 physical assistance;+2 safety/equipment       General transfer comment: cues for hand placement and sequencing. sit<>stand 2x with less physical assist required for the 2nd trial     Balance Overall balance assessment: Needs assistance Sitting-balance support: Feet supported Sitting balance-Leahy Scale: Fair     Standing balance support: Bilateral upper extremity supported;Reliant on assistive device for balance Standing balance-Leahy Scale: Poor Standing balance comment: +2 for standing                           ADL either performed or assessed with clinical judgement   ADL Overall ADL's : Needs assistance/impaired                     Lower Body Dressing: Maximal assistance;Sit to/from stand;+2 for safety/equipment Lower Body Dressing Details (indicate cue type and reason): donned underwear and prosthetic with sit<>stand. verbal cues for problem solving. +2 benefical for safety Toilet Transfer: +2 for physical assistance;+2 for safety/equipment;Maximal assistance;Rolling walker (2 wheels) Toilet Transfer Details (indicate cue type and reason): simulated from bed>chair with prosthetic donned and RW         Functional mobility during  ADLs: Maximal assistance;Moderate assistance;+2 for safety/equipment;+2 for physical assistance;Rolling walker (2 wheels) General ADL  Comments: increased assist this session due to sit<>stand and stand pivot transfer with RW and prosthetic vs. scooting. pt limited py R knee buckling, BUE weakness, and poor activity tolerance    Extremity/Trunk Assessment Upper Extremity Assessment RUE Deficits / Details: Poor coordination, weakness, and tingly/diminished feeling RUE Sensation: decreased light touch;decreased proprioception RUE Coordination: decreased fine motor;decreased gross motor LUE Deficits / Details: Poor coordination, weakness, and tingly/diminished feeling LUE Sensation: decreased light touch;decreased proprioception LUE Coordination: decreased fine motor;decreased gross motor   Lower Extremity Assessment Lower Extremity Assessment: Defer to PT evaluation        Vision   Vision Assessment?: No apparent visual deficits Additional Comments: wears glasses   Perception     Praxis Praxis Praxis: Impaired    Cognition Arousal/Alertness: Awake/alert Behavior During Therapy: WFL for tasks assessed/performed Overall Cognitive Status: Within Functional Limits for tasks assessed                                 General Comments: pt is self-distracting, often changes the subject during educationg and communicates un-related topics                General Comments VSS on RA    Pertinent Vitals/ Pain       Pain Assessment: Faces Faces Pain Scale: Hurts a little bit Pain Location: back > generalized Pain Descriptors / Indicators: Grimacing Pain Intervention(s): Limited activity within patient's tolerance;Monitored during session   Frequency  Min 2X/week        Progress Toward Goals  OT Goals(current goals can now be found in the care plan section)  Progress towards OT goals: Progressing toward goals  Acute Rehab OT Goals Patient Stated Goal: to walk again OT Goal Formulation: With patient Time For Goal Achievement: 03/15/21 Potential to Achieve Goals: Fair ADL Goals Pt Will  Perform Grooming: with set-up;sitting Pt Will Perform Lower Body Bathing: with min guard assist;with adaptive equipment;sitting/lateral leans;sit to/from stand Pt Will Perform Lower Body Dressing: with min assist;sitting/lateral leans;sit to/from stand;with adaptive equipment Pt Will Transfer to Toilet: with min assist;with transfer board Pt Will Perform Toileting - Clothing Manipulation and hygiene: with min assist;sitting/lateral leans;sit to/from stand;with adaptive equipment  Plan Discharge plan remains appropriate;Frequency remains appropriate    Co-evaluation    PT/OT/SLP Co-Evaluation/Treatment: Yes Reason for Co-Treatment: Complexity of the patient's impairments (multi-system involvement);For patient/therapist safety;To address functional/ADL transfers   OT goals addressed during session: ADL's and self-care;Proper use of Adaptive equipment and DME      AM-PAC OT "6 Clicks" Daily Activity     Outcome Measure   Help from another person eating meals?: A Little Help from another person taking care of personal grooming?: A Little Help from another person toileting, which includes using toliet, bedpan, or urinal?: A Lot Help from another person bathing (including washing, rinsing, drying)?: A Lot Help from another person to put on and taking off regular upper body clothing?: A Little Help from another person to put on and taking off regular lower body clothing?: A Lot 6 Click Score: 15    End of Session    OT Visit Diagnosis: Unsteadiness on feet (R26.81);Other abnormalities of gait and mobility (R26.89);Muscle weakness (generalized) (M62.81)   Activity Tolerance Patient tolerated treatment well   Patient Left in chair;with call bell/phone within reach;with chair alarm set   Nurse Communication Mobility status  Time: 8280-0349 OT Time Calculation (min): 33 min  Charges: OT General Charges $OT Visit: 1 Visit OT Treatments $Self Care/Home Management : 8-22  mins   Aalliyah Kilker A Basem Yannuzzi 03/14/2021, 12:51 PM

## 2021-03-15 DIAGNOSIS — I10 Essential (primary) hypertension: Secondary | ICD-10-CM

## 2021-03-15 DIAGNOSIS — I2510 Atherosclerotic heart disease of native coronary artery without angina pectoris: Secondary | ICD-10-CM

## 2021-03-15 MED ORDER — HYDRALAZINE HCL 25 MG PO TABS
25.0000 mg | ORAL_TABLET | Freq: Three times a day (TID) | ORAL | Status: DC
  Administered 2021-03-15 – 2021-03-21 (×15): 25 mg via ORAL
  Filled 2021-03-15 (×17): qty 1

## 2021-03-15 NOTE — Progress Notes (Signed)
Refused Pain medicine. Wanted to wait until he was back in his room

## 2021-03-15 NOTE — TOC Initial Note (Signed)
Transition of Care (TOC) - Initial/Assessment Note    Patient Details  Name: Jose Garrett. MRN: [id number] Date of Birth: 1951-03-03  Transition of Care [facility]) CM/SW Contact:    Geralynn Ochs, LCSW Phone Number: 03/15/2021, 1:58 PM  Clinical Narrative:          CSW met with patient to discuss recommendation for SNF, still likely to remain after his surgery on Friday. Patient in agreement, hopeful for some recovery after the surgery and with rehab. CSW had received a call from his daughter, [name], who was not listed in the chart. CSW asked for permission to contact [facility]. CSW spoke with [name] and answered her questions about SNF placement. [name] asked about possibility of [facility] or [facility]. CSW faxed out referral, spoke with [facility] and [facility] and both have declined; [facility] does not accept HD patients and [facility] is out of network with the patient's insurance. CSW to follow.         Expected Discharge Plan: Skilled Nursing Facility Barriers to Discharge: Continued Medical Work up, Ship broker   Patient Goals and CMS Choice Patient states their goals for this hospitalization and ongoing recovery are:: to get rehab and get better CMS Medicare.gov Compare Post Acute Care list provided to:: Patient Choice offered to / list presented to : Patient, Adult Children  Expected Discharge Plan and Services Expected Discharge Plan: [place] Acute Care Choice: [place] Living arrangements for the past 2 months: Single Family Home                                      Prior Living Arrangements/Services Living arrangements for the past 2 months: Single Family Home Lives with:: Adult Children Patient language and need for interpreter reviewed:: No Do you feel safe going back to the place where you live?: Yes      Need for Family Participation in Patient Care: No (Comment) Care giver support system in  place?: No (comment)   Criminal Activity/Legal Involvement Pertinent to Current Situation/Hospitalization: No - Comment as needed  Activities of Daily Living Home Assistive Devices/Equipment: Wheelchair ADL Screening (condition at time of admission) Patient's cognitive ability adequate to safely complete daily activities?: Yes Is the patient deaf or have difficulty hearing?: No Does the patient have difficulty seeing, even when wearing glasses/contacts?: No Does the patient have difficulty concentrating, remembering, or making decisions?: No Patient able to express need for assistance with ADLs?: Yes Does the patient have difficulty dressing or bathing?: Yes Independently performs ADLs?: No Communication: Independent Dressing (OT): Needs assistance Is this a change from baseline?: Change from baseline, expected to last <3days Grooming: Independent Feeding: Independent Bathing: Independent Toileting: Independent In/Out Bed: Needs assistance Is this a change from baseline?: Change from baseline, expected to last >3 days Walks in Home: Needs assistance Is this a change from baseline?: Change from baseline, expected to last >3 days Does the patient have difficulty walking or climbing stairs?: Yes Weakness of Legs: Both Weakness of Arms/Hands: Both  Permission Sought/Granted Permission sought to share information with : Facility Sport and exercise psychologist, Family Supports Permission granted to share information with : Yes, Verbal Permission Granted  Share Information with NAME: Gwyndolyn Saxon  Permission granted to share info w AGENCY: SNF  Permission granted to share info w Relationship: Daughters     Emotional Assessment Appearance:: Appears stated age Attitude/Demeanor/Rapport: Engaged Affect (typically  observed): Appropriate Orientation: : Oriented to Self, Oriented to Place, Oriented to  Time, Oriented to Situation Alcohol / Substance Use: Not Applicable Psych Involvement: No  (comment)  Admission diagnosis:  CHF (congestive heart failure) (HCC) [I50.9] Spinal cord compression (HCC) [G95.20] Cervical myelopathy (HCC) [G95.9] Weakness [R53.1] Spinal stenosis of cervical region [M48.02] Spinal stenosis of lumbar region, unspecified whether neurogenic claudication present [M48.061] Patient Active Problem List   Diagnosis Date Noted   Spinal stenosis 02/28/2021   Spinal cord compression ([place]) 02/28/2021   Altered mental status 71/16/5790   Acute metabolic encephalopathy 38/33/3832   Mixed hyperlipidemia 11/04/2020   Volume overload 10/16/2020   Acute hypoxemic respiratory failure ([place]) 10/16/2020   Hypertensive urgency 10/16/2020   Pain due to onychomycosis of toenail of left foot 08/30/2020   Lumbar spondylosis 01/23/2020   Neuropathy 10/06/2019   Gait abnormality 08/06/2019   Back muscle spasm 07/09/2019   Sleep disturbance    Nausea without vomiting    Orthostatic hypotension    Acute on chronic anemia    Chronic combined systolic and diastolic CHF (congestive heart failure) (HCC)    ESRD on dialysis ([place])    Right below-knee amputee ([place]) 05/18/2019   Pressure injury of skin 05/14/2019   Enterococcal bacteremia 05/13/2019   Headache, unspecified 01/12/2019   Allergy, unspecified, initial encounter 12/29/2018   Polymorphic ventricular tachycardia 10/24/2018   S/P TAVR (transcatheter aortic valve replacement) 10/21/2018   Anxiety    GERD without esophagitis    Coronary artery disease involving native coronary artery of native heart without angina pectoris    Thrombocytopenia (HCC)    PAD (peripheral artery disease) ([place]) 07/22/2018   Acute on chronic combined systolic and diastolic CHF (congestive heart failure) ([place]) 11/30/2017   Hypercalcemia 06/24/2017   Encounter for removal of sutures 08/15/2016   Other mechanical complication of surgically created arteriovenous fistula, subsequent encounter 06/22/2016   Unspecified protein-calorie  malnutrition ([place]) 02/06/2016   Secondary hyperparathyroidism of renal origin ([place]) 02/06/2016   Pruritus, unspecified 02/06/2016   Other specified coagulation defects ([place]) 02/06/2016   Hypokalemia 02/06/2016   Diarrhea, unspecified 91/91/6606   Complication of vascular dialysis catheter 02/06/2016   Aftercare including intermittent dialysis ([place]) 02/06/2016   ESRD (end stage renal disease) on dialysis [facility])    Iron deficiency anemia 11/22/2015   AL amyloidosis ([place]) 09/25/2014   Anemia of chronic disease 09/25/2014   Essential hypertension, benign 11/06/2013   PCP:  [name], NP Pharmacy:   [facility] [place] - [address], [address] [place] [address] Phone: [phone] Fax: [phone]     Social Determinants of Health (SDOH) Interventions    Readmission Risk Interventions Readmission Risk Prevention Plan 04/15/2019 10/23/2018 08/20/2018  Transportation Screening Complete Complete -  PCP or Specialist Appt within 3-5 Days - Complete -  HRI or [place] - Complete -  Social Work Consult for Recovery Care Planning/Counseling - Complete -  Palliative Care Screening - Not Applicable -  Medication Review Press photographer) Complete Complete -  PCP or Specialist appointment within 3-5 days of discharge Complete - Complete  HRI or Home Care Consult Complete - Complete  SW Recovery Care/Counseling Consult Complete - Complete  Palliative Care Screening Not Applicable - Not Applicable  Skilled Nursing Facility Patient Refused - Not Applicable  Some recent data might be hidden

## 2021-03-15 NOTE — Progress Notes (Signed)
Nephrology Progress Note:   Patient ID: Jose Garrett., male   DOB: 1950-07-25, 70 y.o.   MRN: [id number]  Subjective:  Last HD on 1/2 with 1 kg UF.  He feels ok.  Still ok with plan for HD today then Saturday.    Review of systems:  Hands still numb Denies shortness of breath or chest pain Intermittent nausea/no vomiting   O:BP (!) 168/53 (BP Location: Left Arm)    Pulse 63    Temp 98.1 F (36.7 C) (Oral)    Resp 17    Ht 5\' 10"  (1.778 m)    Wt 68.1 kg    SpO2 95%    BMI 21.54 kg/m  No intake or output data in the 24 hours ending 03/15/21 0742   Intake/Output: No intake/output data recorded.  Intake/Output this shift:  No intake/output data recorded. Weight change:   Physical exam:  Gen: NAD, laying in bed in NAD  HEENT NCAT CVS: S1S2 no rub Resp:CTA and normal work of breathing on room air  Abd: soft, NT/nd Ext: rt bka, no edema Neuro: awake, alert and oriented x 3  Psych Normal mood and affect Dialysis access: R AVF with bruit and thrill   OP HD: MWF [facility]  4h  400/1.5  71.9kg  2/2 bath  Hep 2400  RUA AVF Mircera 75 mcg q4wks - last 02/15/21 Calcitriol 33mcg PO qHD-last 02/27/21      Assessment/Plan: Spinal Cord Compression-imaging shows advanced degenerative changes and spinal canal stenosis with cord compression. Neurosurgery consulted. C-spine surgery planned for 03/17/21  ESRD - has been HD per usual MWF schedule.  Plan for next HD on 1/4.  Later this week we are able to do HD off schedule to accommodate surgery above (anticipate holding HD on 1/6 and doing HD instead on 1/7 depending on volume status and labs).  Renal panel ordered Hypertension/volume- continue on norvasc here. No vol excess on exam, under dry wt 2-3kg.  Unable to really lower further - feel he would not tolerate.  Set Hydralazine at 25 mg TID for now Anemia of CKD - continue ESA - aranesp 100 mcg every monday Secondary Hyperparathyroidism - hyperphos; improved on lanthanum. Resumed calcitriol    Disposition - per primary team - awaiting surgery on 1/6 per pt  Studies/Results: No results found.  (feeding supplement) PROSource Plus  30 mL Oral TID BM   amiodarone  200 mg Oral Daily   amLODipine  10 mg Oral Daily   atorvastatin  80 mg Oral Q supper   calcitRIOL  1 mcg Oral Q M,W,F-HD   Chlorhexidine Gluconate Cloth  6 each Topical Q0600   Chlorhexidine Gluconate Cloth  6 each Topical Q0600   darbepoetin (ARANESP) injection - DIALYSIS  100 mcg Intravenous Q Mon-HD   heparin  5,000 Units Subcutaneous Q12H   hydrALAZINE  25 mg Oral BID   lanthanum  2,000 mg Oral TID WC   pantoprazole  40 mg Oral Daily   senna-docusate  1 tablet Oral BID   vitamin B-12  1,000 mcg Oral QPM     Claudia Desanctis, MD 03/15/2021 7:50 AM

## 2021-03-15 NOTE — NC FL2 (Signed)
[place] LEVEL OF CARE SCREENING TOOL     IDENTIFICATION  Patient Name: Jose Garrett. Birthdate: 07-13-1950 Sex: male Admission Date (Current Location): 02/27/2021  [facility] and [place] Number:  Herbalist and Address:  [facility] [place], [place] [address], [place], [place] [address]      Provider Number: [id number]  Attending Physician Name and Address:  Bonnielee Haff, MD  Relative Name and Phone Number:       Current Level of Care: Hospital Recommended Level of Care: [place] Prior Approval Number:    Date Approved/Denied:   PASRR Number: [id number]  Discharge Plan: SNF    Current Diagnoses: Patient Active Problem List   Diagnosis Date Noted   Spinal stenosis 02/28/2021   Spinal cord compression ([place]) 02/28/2021   Altered mental status 62/70/3500   Acute metabolic encephalopathy 93/81/8299   Mixed hyperlipidemia 11/04/2020   Volume overload 10/16/2020   Acute hypoxemic respiratory failure ([place]) 10/16/2020   Hypertensive urgency 10/16/2020   Pain due to onychomycosis of toenail of left foot 08/30/2020   Lumbar spondylosis 01/23/2020   Neuropathy 10/06/2019   Gait abnormality 08/06/2019   Back muscle spasm 07/09/2019   Sleep disturbance    Nausea without vomiting    Orthostatic hypotension    Acute on chronic anemia    Chronic combined systolic and diastolic CHF (congestive heart failure) ([place])    ESRD on dialysis ([place])    Right below-knee amputee ([place]) 05/18/2019   Pressure injury of skin 05/14/2019   Enterococcal bacteremia 05/13/2019   Headache, unspecified 01/12/2019   Allergy, unspecified, initial encounter 12/29/2018   Polymorphic ventricular tachycardia 10/24/2018   S/P TAVR (transcatheter aortic valve replacement) 10/21/2018   Anxiety    GERD without esophagitis    Coronary artery disease involving native coronary artery of native heart without angina pectoris    Thrombocytopenia (HCC)     PAD (peripheral artery disease) ([place]) 07/22/2018   Acute on chronic combined systolic and diastolic CHF (congestive heart failure) ([place]) 11/30/2017   Hypercalcemia 06/24/2017   Encounter for removal of sutures 08/15/2016   Other mechanical complication of surgically created arteriovenous fistula, subsequent encounter 06/22/2016   Unspecified protein-calorie malnutrition ([place]) 02/06/2016   Secondary hyperparathyroidism of renal origin ([place]) 02/06/2016   Pruritus, unspecified 02/06/2016   Other specified coagulation defects ([place]) 02/06/2016   Hypokalemia 02/06/2016   Diarrhea, unspecified 37/16/9678   Complication of vascular dialysis catheter 02/06/2016   Aftercare including intermittent dialysis ([place]) 02/06/2016   ESRD (end stage renal disease) on dialysis [facility])    Iron deficiency anemia 11/22/2015   AL amyloidosis ([place]) 09/25/2014   Anemia of chronic disease 09/25/2014   Essential hypertension, benign 11/06/2013    Orientation RESPIRATION BLADDER Height & Weight     Self, Time, Situation, Place  Normal Continent Weight: 147 lb 14.9 oz (67.1 kg) Height:  5\' 10"  (177.8 cm)  BEHAVIORAL SYMPTOMS/MOOD NEUROLOGICAL BOWEL NUTRITION STATUS      Continent Diet (renal, fluid restriction to 1200 mL)  AMBULATORY STATUS COMMUNICATION OF NEEDS Skin   Extensive Assist Verbally Surgical wounds                       Personal Care Assistance Level of Assistance  Bathing, Feeding, Dressing Bathing Assistance: Maximum assistance Feeding assistance: Limited assistance Dressing Assistance: Maximum assistance     Functional Limitations Info  Sight Sight Info: Impaired (glasses)        SPECIAL CARE  FACTORS FREQUENCY  PT (By licensed PT), OT (By licensed OT)     PT Frequency: 5x/wk OT Frequency: 5x/wk            Contractures Contractures Info: Not present    Additional Factors Info  Code Status, Allergies Code Status Info: Full Allergies Info: Levonorgestrel-ethinyl  Estrad           Current Medications (03/15/2021):  This is the current hospital active medication list Current Facility-Administered Medications  Medication Dose Route Frequency Provider Last Rate Last Admin   (feeding supplement) PROSource Plus liquid 30 mL  30 mL Oral TID BM Adelfa Koh, NP   30 mL at 03/09/21 0924   acetaminophen (TYLENOL) tablet 325-650 mg  325-650 mg Oral Q4H PRN Wynetta Fines T, MD   650 mg at 03/14/21 1435   albuterol (PROVENTIL) (2.5 MG/3ML) 0.083% nebulizer solution 2.5 mg  2.5 mg Inhalation Q6H PRN Wynetta Fines T, MD       amiodarone (PACERONE) tablet 200 mg  200 mg Oral Daily Wynetta Fines T, MD   200 mg at 03/15/21 1234   amLODipine (NORVASC) tablet 10 mg  10 mg Oral Daily Wynetta Fines T, MD   10 mg at 03/15/21 1234   atorvastatin (LIPITOR) tablet 80 mg  80 mg Oral Q supper Wynetta Fines T, MD   80 mg at 03/14/21 1825   bisacodyl (DULCOLAX) suppository 10 mg  10 mg Rectal Daily PRN Aline August, MD   10 mg at 03/12/21 1630   calcitRIOL (ROCALTROL) capsule 1 mcg  1 mcg Oral Q M,W,F-HD Claudia Desanctis, MD   1 mcg at 03/15/21 1251   Chlorhexidine Gluconate Cloth 2 % PADS 6 each  6 each Topical Q0600 Adelfa Koh, NP   6 each at 03/15/21 0433   Chlorhexidine Gluconate Cloth 2 % PADS 6 each  6 each Topical Q0600 Claudia Desanctis, MD       Darbepoetin Alfa (ARANESP) injection 100 mcg  100 mcg Intravenous Q Mon-HD Darlina Sicilian, RPH   100 mcg at 03/13/21 0913   heparin injection 5,000 Units  5,000 Units Subcutaneous Q12H Wynetta Fines T, MD   5,000 Units at 03/14/21 2220   hydrALAZINE (APRESOLINE) tablet 25 mg  25 mg Oral Q6H PRN Wynetta Fines T, MD   25 mg at 03/14/21 0518   hydrALAZINE (APRESOLINE) tablet 25 mg  25 mg Oral Q8H Claudia Desanctis, MD       lanthanum [facility]) chewable tablet 2,000 mg  2,000 mg Oral TID WC Wynetta Fines T, MD   2,000 mg at 03/14/21 1825   loperamide (IMODIUM) capsule 2 mg  2 mg Oral PRN Etta Quill, DO   2 mg at 03/04/21 0420    LORazepam (ATIVAN) tablet 1 mg  1 mg Oral BID PRN Wynetta Fines T, MD   1 mg at 03/09/21 1003   ondansetron (ZOFRAN) tablet 4 mg  4 mg Oral BID PRN Wynetta Fines T, MD   4 mg at 03/11/21 0114   oxyCODONE (Oxy IR/ROXICODONE) immediate release tablet 5 mg  5 mg Oral Q4H PRN Caren Griffins, MD   5 mg at 03/15/21 1233   pantoprazole (PROTONIX) EC tablet 40 mg  40 mg Oral Daily Wynetta Fines T, MD   40 mg at 03/15/21 1234   polyethylene glycol (MIRALAX / GLYCOLAX) packet 17 g  17 g Oral Daily PRN Aline August, MD       senna-docusate (Senokot-S) tablet 1  tablet  1 tablet Oral BID Aline August, MD   1 tablet at 03/15/21 1251   vitamin B-12 (CYANOCOBALAMIN) tablet 1,000 mcg  1,000 mcg Oral QPM Lequita Halt, MD   1,000 mcg at 03/14/21 1825     Discharge Medications: Please see discharge summary for a list of discharge medications.  Relevant Imaging Results:  Relevant Lab Results:   Additional Information SS#: [id number]; HD MWF at [facility], LCSW

## 2021-03-15 NOTE — Progress Notes (Signed)
PROGRESS NOTE    Jose Garrett.  KDX:[id number] DOB: 1950/11/16 DOA: 02/27/2021 PCP: [name], NP    Brief Narrative:  71 year old male with amyloidosis, chronic combined CHF, ESRD, CAD, peripheral vascular disease status post right BKA, severe AS status post TAVR presented to the hospital with increased weakness of bilateral arms, hands, fingers as well as wheelchair-bound for the past 3 weeks.  Patient has been unable to lift his arms above the shoulder which is completely new.  Imaging in the ED showed subacute spinal cord compression.  Neurosurgery and nephrology were consulted.  Assessment & Plan:   Subacute C-spine cord compression -MRI on admission showed advanced degenerative changes resulting in severe spinal canal stenosis with cord compression from C3-4 through C5-6.   -Neurosurgery following: Currently tentatively planned for surgery on 03/17/2021.  Chronic combined CHF -most recent 2D echo in August 2021 showed an EF of 45-50%, mild decreased LV function with global hypokinesis.  There was also grade 1 diastolic dysfunction.  RV was mildly reduced as well.   -Volume managed by dialysis.   History of severe aortic stenosis Status post TAVR by Dr. Burt Knack in 2020.   Peripheral vascular disease Seen by vascular surgery as an outpatient, status post BKA on the right.  He has a femoropopliteal bypass graft on the left   Coronary artery disease Most recent cath was done in May 2020 which showed severe single-vessel CAD with chronic total occlusion of the RCA, with collaterals.  Mild calcific nonobstructive LCA disease.   -Continue statin. No chest pain.  Plavix held on 03/11/2021 in preparation for neurosurgery on 03/17/2021.  Essential hypertension Occasional high readings noted.  Continue amlodipine.  Hydralazine added by nephrology.  He is experiencing back pain which could also be contributing to elevated blood pressure.   ESRD on HD He is on a Monday Wednesday Friday  schedule.  Nephrology is following.     Chronic anemia secondary to ESRD -Hemoglobin currently stable.  Monitor intermittently.  Thrombocytopenia -Questionable cause.  No signs of bleeding.  Monitor intermittently.    DVT prophylaxis: Heparin Code Status: Full Family Communication: None at bedside Disposition Plan: To be determined after surgery  Status is: Inpatient  Remains inpatient appropriate because: Of severity of illness.  Need for surgical intervention and subsequent SNF placement.  Consultants: Neurosurgery/nephrology  Procedures: None  Antimicrobials: None   Subjective: Patient seen after he returned from hemodialysis.  Continues to have neck pain as well as weakness of his arms.  Denies any chest pain shortness of breath.  No nausea vomiting.     Objective: Vitals:   03/15/21 1030 03/15/21 1100 03/15/21 1130 03/15/21 1226  BP: (!) 121/37 (!) 108/53 (!) 123/40 133/76  Pulse: 68 70 69 70  Resp:    18  Temp:    98 F (36.7 C)  TempSrc:    Oral  SpO2:    100%  Weight:      Height:       No intake or output data in the 24 hours ending 03/15/21 1248  Filed Weights   03/13/21 0701 03/13/21 1050 03/15/21 0811  Weight: 69.5 kg 68.1 kg 68.2 kg    Examination:  General appearance: Awake alert.  In no distress Resp: Clear to auscultation bilaterally.  Normal effort Cardio: S1-S2 is normal regular.  No S3-S4.  No rubs murmurs or bruit GI: Abdomen is soft.  Nontender nondistended.  Bowel sounds are present normal.  No masses organomegaly Extremities: No edema.  Neurologic: Alert and oriented x3.  No facial asymmetry.  Weakness of both his upper extremities noted.   Data Reviewed: I have personally reviewed following labs and imaging studies  CBC: Recent Labs  Lab 03/10/21 0231 03/13/21 0725  WBC 5.2 5.7  HGB 9.8* 10.4*  HCT 30.6* 32.7*  MCV 96.5 96.5  PLT 137* 140*    Basic Metabolic Panel: Recent Labs  Lab 03/10/21 0231 03/13/21 0727  03/14/21 0742  NA 137 134* 136  K 4.3 5.1 4.3  CL 98 93* 96*  CO2 25 23 28   GLUCOSE 89 89 105*  BUN 39* 58* 27*  CREATININE 9.01* 11.37* 7.50*  CALCIUM 9.7 9.3 9.8  PHOS  --  8.0* 5.5*    GFR: Estimated Creatinine Clearance: 9.1 mL/min (A) (by C-G formula based on SCr of 7.5 mg/dL (H)).  Liver Function Tests: Recent Labs  Lab 03/13/21 0727 03/14/21 0742  ALBUMIN 3.5 3.5    Radiology Studies: No results found.   Scheduled Meds:  (feeding supplement) PROSource Plus  30 mL Oral TID BM   amiodarone  200 mg Oral Daily   amLODipine  10 mg Oral Daily   atorvastatin  80 mg Oral Q supper   calcitRIOL  1 mcg Oral Q M,W,F-HD   Chlorhexidine Gluconate Cloth  6 each Topical Q0600   Chlorhexidine Gluconate Cloth  6 each Topical Q0600   darbepoetin (ARANESP) injection - DIALYSIS  100 mcg Intravenous Q Mon-HD   heparin  5,000 Units Subcutaneous Q12H   hydrALAZINE  25 mg Oral Q8H   lanthanum  2,000 mg Oral TID WC   pantoprazole  40 mg Oral Daily   senna-docusate  1 tablet Oral BID   vitamin B-12  1,000 mcg Oral QPM   Continuous Infusions:    Bonnielee Haff, MD [facility] 03/15/2021, 12:48 PM

## 2021-03-15 NOTE — Procedures (Signed)
Seen and examined on dialysis.  Procedure supervised.  Blood pressure 120/43 and HR 63.  Tolerating goal.  Right AVF in use.   Claudia Desanctis, MD 03/15/2021 9:41 AM

## 2021-03-16 ENCOUNTER — Ambulatory Visit: Payer: Medicare Other | Admitting: Occupational Therapy

## 2021-03-16 LAB — RENAL FUNCTION PANEL
Albumin: 3.5 g/dL (ref 3.5–5.0)
Anion gap: 9 (ref 5–15)
BUN: 22 mg/dL (ref 8–23)
CO2: 30 mmol/L (ref 22–32)
Calcium: 9.9 mg/dL (ref 8.9–10.3)
Chloride: 97 mmol/L — ABNORMAL LOW (ref 98–111)
Creatinine, Ser: 6.59 mg/dL — ABNORMAL HIGH (ref 0.61–1.24)
GFR, Estimated: 9 mL/min — ABNORMAL LOW (ref >60)
Glucose, Bld: 97 mg/dL (ref 70–99)
Phosphorus: 5.2 mg/dL — ABNORMAL HIGH (ref 2.5–4.6)
Potassium: 4.3 mmol/L (ref 3.5–5.1)
Sodium: 136 mmol/L (ref 135–145)

## 2021-03-16 LAB — SURGICAL PCR SCREEN
MRSA, PCR: NEGATIVE
Staphylococcus aureus: NEGATIVE

## 2021-03-16 MED ORDER — SENNOSIDES-DOCUSATE SODIUM 8.6-50 MG PO TABS
2.0000 | ORAL_TABLET | Freq: Two times a day (BID) | ORAL | Status: DC
  Administered 2021-03-16 – 2021-03-24 (×12): 2 via ORAL
  Filled 2021-03-16 (×15): qty 2

## 2021-03-16 MED ORDER — CHLORHEXIDINE GLUCONATE CLOTH 2 % EX PADS
6.0000 | MEDICATED_PAD | Freq: Once | CUTANEOUS | Status: AC
  Administered 2021-03-16: 6 via TOPICAL

## 2021-03-16 MED ORDER — POLYETHYLENE GLYCOL 3350 17 G PO PACK
17.0000 g | PACK | Freq: Once | ORAL | Status: AC
Start: 2021-03-16 — End: 2021-03-16
  Administered 2021-03-16: 17 g via ORAL
  Filled 2021-03-16: qty 1

## 2021-03-16 MED ORDER — POLYETHYLENE GLYCOL 3350 17 G PO PACK
17.0000 g | PACK | Freq: Every day | ORAL | Status: DC
  Administered 2021-03-18 – 2021-03-24 (×3): 17 g via ORAL
  Filled 2021-03-16 (×5): qty 1

## 2021-03-16 NOTE — Progress Notes (Signed)
Nephrology Progress Note:   Patient ID: Jose Pellerito., male   DOB: January 04, 1951, 71 y.o.   MRN: [id number]  Subjective:  Last HD on 1/4 with 1 kg UF.  He feels like the last hour of HD was hard on him yesterday - he asked to be taken off due to same.    Review of systems:  Hands still numb Denies shortness of breath or chest pain Slight nausea yesterday on HD - resolved  Constipation - states got miralax overnight   O:BP (!) 157/44 (BP Location: Left Arm)    Pulse 67    Temp 97.9 F (36.6 C) (Oral)    Resp 20    Ht 5\' 10"  (1.778 m)    Wt 67.1 kg    SpO2 99%    BMI 21.23 kg/m   Intake/Output Summary (Last 24 hours) at 03/16/2021 0739 Last data filed at 03/15/2021 1600 Gross per 24 hour  Intake 240 ml  Output 1025 ml  Net -785 ml     Intake/Output: I/O last 3 completed shifts: In: 478 [P.O.:478] Out: 1025 [Other:1025]  Intake/Output this shift:  No intake/output data recorded. Weight change:   Physical exam: Gen: adult male in bed in NAD HEENT NCAT CVS: S1S2 no rub Resp:CTA and normal work of breathing on room air  Abd: soft, NT/nd Ext: rt bka, no edema Neuro: awake, alert and oriented x 3  Psych Normal mood and affect Dialysis access: R AVF with bruit and thrill   OP HD: MWF [facility]  4h  400/1.5  71.9kg  2/2 bath  Hep 2400  RUA AVF Mircera 75 mcg q4wks - last 02/15/21 Calcitriol 40mcg PO qHD-last 02/27/21      Assessment/Plan: Spinal Cord Compression-imaging shows advanced degenerative changes and spinal canal stenosis with cord compression. Neurosurgery consulted. C-spine surgery planned for 03/17/21  ESRD - has been HD per usual MWF schedule.  Next HD will be 1/7 off schedule to accommodate surgery above.  Renal panel today and daily  Hypertension/volume- continue on norvasc here. No vol excess on exam, under dry wt 3+kg (would not lower EDW to his nadir here).  Unable to lower further - feel he would not tolerate.  Started hydralazine at 25 mg TID this admit.   Perioperative BP goals per neurosurgery and primary team  Anemia of CKD - continue ESA - aranesp 100 mcg every monday Secondary Hyperparathyroidism - hyperphos; improved on lanthanum. Resumed calcitriol   Disposition - per primary team - awaiting surgery on 1/6 per pt  Studies/Results: No results found.  (feeding supplement) PROSource Plus  30 mL Oral TID BM   amiodarone  200 mg Oral Daily   amLODipine  10 mg Oral Daily   atorvastatin  80 mg Oral Q supper   calcitRIOL  1 mcg Oral Q M,W,F-HD   Chlorhexidine Gluconate Cloth  6 each Topical Q0600   Chlorhexidine Gluconate Cloth  6 each Topical Q0600   darbepoetin (ARANESP) injection - DIALYSIS  100 mcg Intravenous Q Mon-HD   heparin  5,000 Units Subcutaneous Q12H   hydrALAZINE  25 mg Oral Q8H   lanthanum  2,000 mg Oral TID WC   pantoprazole  40 mg Oral Daily   senna-docusate  1 tablet Oral BID   vitamin B-12  1,000 mcg Oral QPM     Jose Desanctis, MD 03/16/2021 7:51 AM

## 2021-03-16 NOTE — Therapy (Signed)
[facility] [address] [place], [place], [address] Phone: [phone]   Fax:  [phone]  Patient Details  Name: Jose Garrett. MRN: [id number] Date of Birth: January 14, 1951 Referring Provider:  No ref. provider found  Encounter Date: 03/16/2021  Pt has been admitted to hospital and is scheduled for surgery tomorrow. Will d/c episode of care for outpatient O.T. at this time. Pt was only seen for initial evaluation. Spoke with pt's daughter, [name] (main caregiver) today to make aware of plan. She verbalized understanding. Encouraged pt to return to outpatient with new referral once pt has healed and safe to return to this setting, however anticipate he will need possible inpatient rehab or SNF prior to returning to outpatient setting.   Carey Bullocks, OTR/L 03/16/2021, 10:23 AM  [facility] [address] [place], [place], [address] Phone: [phone]   Fax:  [phone]

## 2021-03-16 NOTE — Progress Notes (Signed)
Physical Therapy Treatment Patient Details Name: Jose Garrett. MRN: [id number] DOB: [birth year] Today's Date: 03/16/2021   History of Present Illness 71 y.o. m admitted on 02/27/21 due to increased weakness of BUE weakness and has become WC bound in the past 3 weeks due to weakness. MRI shows subacute c-spine cord compression, which he is not a surgical candidate for. PMH significant for amyloidosis, chronic combined CHF, ESRD, CAD, peripheral vascular disease status post right BKA, severe AS status post TAVR.    PT Comments    Patient progressing this session with bilateral platform walker able to walk several feet in the room and into hallway with chair following.  He needed assist to get hands around handles and for managing the walker as well as for lateral weight shifts and cues for foot placement.  More difficulty as well donning prosthesis so assist provided.  Patient eager for improvements after surgery.  PT will continue to follow goals updated this session.    Recommendations for follow up therapy are one component of a multi-disciplinary discharge planning process, led by the attending physician.  Recommendations may be updated based on patient status, additional functional criteria and insurance authorization.  Follow Up Recommendations  Skilled nursing-short term rehab (<3 hours/day)     Assistance Recommended at Discharge Frequent or constant Supervision/Assistance  Patient can return home with the following Two people to help with walking and/or transfers;A lot of help with bathing/dressing/bathroom;Assistance with cooking/housework;Assist for transportation;Help with stairs or ramp for entrance   Equipment Recommendations  None recommended by PT    Recommendations for Other Services       Precautions / Restrictions Precautions Precautions: Fall Restrictions Weight Bearing Restrictions: No     Mobility  Bed Mobility Overal bed mobility: Needs Assistance Bed  Mobility: Supine to Sit     Supine to sit: Mod assist     General bed mobility comments: Patient sitting EOB upon my entry    Transfers Overall transfer level: Needs assistance Equipment used: Bilateral platform walker (eva walker) Transfers: Sit to/from Stand Sit to Stand: Mod assist;+2 physical assistance;+2 safety/equipment           General transfer comment: Pt completed sit<>stands x3 this session, requiring increased assist for lower surface to power up to standing, and increased assist throughout for steadying.  Assist for making sure fingers around handles on Eva walker    Ambulation/Gait Ambulation/Gait assistance: Mod assist;+2 physical assistance Gait Distance (Feet): 8 Feet (&10') Assistive device: Bilateral platform walker (eva walker) Gait Pattern/deviations: Step-to pattern;Decreased stride length;Narrow base of support;Decreased dorsiflexion - left;Decreased dorsiflexion - right;Decreased weight shift to left;Ataxic       General Gait Details: assist for lateral weight shifts especially to L; cues for foot placement as R foot at time too close to L, assist to manage walker, assist for keeping chair close when fatigued to sit   Stairs             Wheelchair Mobility    Modified Rankin (Stroke Patients Only)       Balance Overall balance assessment: Needs assistance Sitting-balance support: Feet supported Sitting balance-Leahy Scale: Good     Standing balance support: Bilateral upper extremity supported;Reliant on assistive device for balance Standing balance-Leahy Scale: Poor Standing balance comment: +2 external assist for standing                            Cognition Arousal/Alertness: Awake/alert Behavior During Therapy: [facility]  for tasks assessed/performed Overall Cognitive Status: Within Functional Limits for tasks assessed                                          Exercises      General Comments General  comments (skin integrity, edema, etc.): VSS on RA; using cup with handle and noted built up utensils and universal cuff pt reports he is not using      Pertinent Vitals/Pain Pain Assessment: No/denies pain    Home Living                          Prior Function            PT Goals (current goals can now be found in the care plan section) Acute Rehab PT Goals Patient Stated Goal: to improve hand function and walking PT Goal Formulation: With patient Time For Goal Achievement: 03/30/21 Potential to Achieve Goals: Fair Progress towards PT goals: Progressing toward goals;Goals downgraded-see care plan    Frequency    Min 2X/week      PT Plan Current plan remains appropriate    Co-evaluation PT/OT/SLP Co-Evaluation/Treatment: Yes Reason for Co-Treatment: For patient/therapist safety;To address functional/ADL transfers PT goals addressed during session: Mobility/safety with mobility;Balance;Proper use of DME OT goals addressed during session: Strengthening/ROM;Proper use of Adaptive equipment and DME      AM-PAC PT "6 Clicks" Mobility   Outcome Measure  Help needed turning from your back to your side while in a flat bed without using bedrails?: A Lot Help needed moving from lying on your back to sitting on the side of a flat bed without using bedrails?: Total Help needed moving to and from a bed to a chair (including a wheelchair)?: Total Help needed standing up from a chair using your arms (e.g., wheelchair or bedside chair)?: Total Help needed to walk in hospital room?: Total Help needed climbing 3-5 steps with a railing? : Total 6 Click Score: 7    End of Session Equipment Utilized During Treatment: Gait belt Activity Tolerance: Patient limited by fatigue Patient left: in chair;with call bell/phone within reach;with chair alarm set   PT Visit Diagnosis: Other abnormalities of gait and mobility (R26.89);Muscle weakness (generalized) (M62.81)     Time:  6812-7517 PT Time Calculation (min) (ACUTE ONLY): 30 min  Charges:  $Gait Training: 8-22 mins                     [name], PT Acute Rehabilitation Services [phone] [phone] 03/16/2021    [name] 03/16/2021, 2:51 PM

## 2021-03-16 NOTE — Progress Notes (Signed)
Occupational Therapy Treatment Patient Details Name: Jose Garrett. MRN: [id number] DOB: 1950/07/12 Today's Date: 03/16/2021   History of present illness 71 y.o. m admitted on 02/27/21 due to increased weakness of BUE weakness and has become WC bound in the past 3 weeks due to weakness. MRI shows subacute c-spine cord compression, which he is not a surgical candidate for. PMH significant for amyloidosis, chronic combined CHF, ESRD, CAD, peripheral vascular disease status post right BKA, severe AS status post TAVR.   OT comments  Pt making incremental progress with OT goals. This session, pt demonstrated increased activity tolerance with functional mobility, benefiting from a chair follow and +2 assist for safety and stability. Pt continues to present with difficulties with BUE, unable to hold a RW, benefiting from platform walker for mobility and built up handles for ADL's. Pt is scheduled for spinal surgery tomorrow, OT will continue to follow acutely s/p procedure.    Recommendations for follow up therapy are one component of a multi-disciplinary discharge planning process, led by the attending physician.  Recommendations may be updated based on patient status, additional functional criteria and insurance authorization.    Follow Up Recommendations  Skilled nursing-short term rehab (<3 hours/day)    Assistance Recommended at Discharge Frequent or constant Supervision/Assistance  Patient can return home with the following  A lot of help with walking and/or transfers;A lot of help with bathing/dressing/bathroom;Direct supervision/assist for medications management;Assist for transportation;Help with stairs or ramp for entrance   Equipment Recommendations  None recommended by OT    Recommendations for Other Services      Precautions / Restrictions Precautions Precautions: Fall Restrictions Weight Bearing Restrictions: No       Mobility Bed Mobility Overal bed mobility: Needs  Assistance Bed Mobility: Supine to Sit     Supine to sit: Mod assist     General bed mobility comments: mod A from a semi-flat surface. HOB elevated slightly. pt requiring increased time and cues for technique    Transfers Overall transfer level: Needs assistance Equipment used: Bilateral platform walker Transfers: Sit to/from Stand Sit to Stand: Mod assist;+2 physical assistance;+2 safety/equipment           General transfer comment: Pt completed sit<>stands x3 this session, requiring increased assist for lower surface to power up to standing, and increased assist throughout for steadying.     Balance Overall balance assessment: Needs assistance Sitting-balance support: Feet supported Sitting balance-Leahy Scale: Good     Standing balance support: Bilateral upper extremity supported;Reliant on assistive device for balance Standing balance-Leahy Scale: Poor Standing balance comment: +2 external assist for standing                           ADL either performed or assessed with clinical judgement   ADL Overall ADL's : Needs assistance/impaired                     Lower Body Dressing: Maximal assistance;Sitting/lateral leans Lower Body Dressing Details (indicate cue type and reason): Pt needed assist for donning prosthetic sleeve and shoes Toilet Transfer: Moderate assistance;Stand-pivot Toilet Transfer Details (indicate cue type and reason): Pt able to tolerate short distances of ambulation with +2 assist. Pt able to transfer self with lat scoot from drop arm surfaces         Functional mobility during ADLs: Moderate assistance;Maximal assistance;+2 for safety/equipment;+2 for physical assistance (Platform RW) General ADL Comments: Increased assist to work on sit<>stands and  functional mobility this session, as well as activity tolerance    Extremity/Trunk Assessment              Vision       Perception     Praxis      Cognition  Arousal/Alertness: Awake/alert Behavior During Therapy: WFL for tasks assessed/performed Overall Cognitive Status: Within Functional Limits for tasks assessed                                            Exercises     Shoulder Instructions       General Comments VSS on RA    Pertinent Vitals/ Pain       Pain Assessment: No/denies pain  Home Living                                          Prior Functioning/Environment              Frequency  Min 2X/week        Progress Toward Goals  OT Goals(current goals can now be found in the care plan section)  Progress towards OT goals: Progressing toward goals  Acute Rehab OT Goals Patient Stated Goal: To get some independence back OT Goal Formulation: With patient Time For Goal Achievement: 03/30/21 Potential to Achieve Goals: Fair ADL Goals Pt Will Perform Grooming: with set-up;sitting Pt Will Perform Lower Body Bathing: with min guard assist;with adaptive equipment;sitting/lateral leans;sit to/from stand Pt Will Perform Lower Body Dressing: with min assist;sitting/lateral leans;sit to/from stand;with adaptive equipment Pt Will Transfer to Toilet: with min assist;with transfer board Pt Will Perform Toileting - Clothing Manipulation and hygiene: with min assist;sitting/lateral leans;sit to/from stand;with adaptive equipment  Plan Discharge plan remains appropriate;Frequency remains appropriate    Co-evaluation    PT/OT/SLP Co-Evaluation/Treatment: Yes Reason for Co-Treatment: Complexity of the patient's impairments (multi-system involvement);For patient/therapist safety;To address functional/ADL transfers   OT goals addressed during session: Strengthening/ROM;Proper use of Adaptive equipment and DME      AM-PAC OT "6 Clicks" Daily Activity     Outcome Measure   Help from another person eating meals?: A Little Help from another person taking care of personal grooming?: A  Little Help from another person toileting, which includes using toliet, bedpan, or urinal?: A Lot Help from another person bathing (including washing, rinsing, drying)?: A Lot Help from another person to put on and taking off regular upper body clothing?: A Little Help from another person to put on and taking off regular lower body clothing?: A Lot 6 Click Score: 15    End of Session Equipment Utilized During Treatment: Gait belt (Platform walker)  OT Visit Diagnosis: Unsteadiness on feet (R26.81);Other abnormalities of gait and mobility (R26.89);Muscle weakness (generalized) (M62.81)   Activity Tolerance Patient tolerated treatment well   Patient Left in chair;with call bell/phone within reach;with chair alarm set   Nurse Communication Mobility status        Time: 7628-3151 OT Time Calculation (min): 28 min  Charges: OT Treatments $Therapeutic Activity: 8-22 mins  Posey Jasmin H., OTR/L Acute Rehabilitation  Kaleb Linquist Elane Diva Lemberger 03/16/2021, 1:41 PM

## 2021-03-16 NOTE — Progress Notes (Signed)
PROGRESS NOTE    Jose Garrett.  PYK:[id number] DOB: June 11, 1950 DOA: 02/27/2021 PCP: [name], NP    Brief Narrative:  71 year old male with amyloidosis, chronic combined CHF, ESRD, CAD, peripheral vascular disease status post right BKA, severe AS status post TAVR presented to the hospital with increased weakness of bilateral arms, hands, fingers as well as wheelchair-bound for the past 3 weeks.  Patient has been unable to lift his arms above the shoulder which is completely new.  Imaging in the ED showed subacute spinal cord compression.  Neurosurgery and nephrology were consulted.   Assessment & Plan:   Subacute C-spine cord compression -MRI on admission showed advanced degenerative changes resulting in severe spinal canal stenosis with cord compression from C3-4 through C5-6.   -Neurosurgery following: Currently tentatively planned for surgery on 03/17/2021. Patient is stable from a neurological standpoint.  Chronic combined CHF -most recent 2D echo in August 2021 showed an EF of 45-50%, mild decreased LV function with global hypokinesis.  There was also grade 1 diastolic dysfunction.  RV was mildly reduced as well.   -Volume managed by dialysis.   History of severe aortic stenosis Status post TAVR by Dr. Burt Knack in 2020.   Peripheral vascular disease Seen by vascular surgery as an outpatient, status post BKA on the right.  He has a femoropopliteal bypass graft on the left   Coronary artery disease Most recent cath was done in May 2020 which showed severe single-vessel CAD with chronic total occlusion of the RCA, with collaterals.  Mild calcific nonobstructive LCA disease.   -Continue statin. No chest pain.  Plavix held on 03/11/2021 in preparation for neurosurgery on 03/17/2021.  Essential hypertension Patient remains on amlodipine and hydralazine.  He is experiencing neck and back pain which could also be contributing to elevated blood pressure.   ESRD on HD He is on a  Monday Wednesday Friday schedule.  Nephrology is following.  To be dialyzed off schedule on 1/7.  Plan is for surgery on 1/6.   Chronic anemia secondary to ESRD -Hemoglobin currently stable.  Monitor intermittently.  Thrombocytopenia Unclear etiology.  Counts have been stable though.    DVT prophylaxis: Heparin Code Status: Full Family Communication: Discussed with patient Disposition Plan: To be determined after surgery  Status is: Inpatient  Remains inpatient appropriate because: Need for surgical intervention and subsequent SNF placement.  Consultants: Neurosurgery/nephrology  Procedures: None  Antimicrobials: None   Subjective: Patient feels well.  Denies any complaints.  Pain is reasonably well controlled.  Requesting something for constipation.   Objective: Vitals:   03/15/21 2307 03/16/21 0328 03/16/21 0619 03/16/21 0819  BP: (!) 149/60 (!) 172/55 (!) 157/44 (!) 164/56  Pulse: 65 67  66  Resp: 18 20  18   Temp: 98.2 F (36.8 C) 97.9 F (36.6 C)  97.8 F (36.6 C)  TempSrc: Oral Oral  Oral  SpO2: 98% 99%  97%  Weight:      Height:        Intake/Output Summary (Last 24 hours) at 03/16/2021 1059 Last data filed at 03/16/2021 0900 Gross per 24 hour  Intake 600 ml  Output 1025 ml  Net -425 ml    Filed Weights   03/13/21 1050 03/15/21 0811 03/15/21 1145  Weight: 68.1 kg 68.2 kg 67.1 kg    Examination:  General appearance: Awake alert.  In no distress Resp: Clear to auscultation bilaterally.  Normal effort Cardio: S1-S2 is normal regular.  No S3-S4.  No rubs murmurs or bruit  GI: Abdomen is soft.  Nontender nondistended.  Bowel sounds are present normal.  No masses organomegaly Extremities: No edema.      Data Reviewed: I have personally reviewed following labs and imaging studies  CBC: Recent Labs  Lab 03/10/21 0231 03/13/21 0725  WBC 5.2 5.7  HGB 9.8* 10.4*  HCT 30.6* 32.7*  MCV 96.5 96.5  PLT 137* 140*    Basic Metabolic Panel: Recent  Labs  Lab 03/10/21 0231 03/13/21 0727 03/14/21 0742 03/16/21 0753  NA 137 134* 136 136  K 4.3 5.1 4.3 4.3  CL 98 93* 96* 97*  CO2 25 23 28 30   GLUCOSE 89 89 105* 97  BUN 39* 58* 27* 22  CREATININE 9.01* 11.37* 7.50* 6.59*  CALCIUM 9.7 9.3 9.8 9.9  PHOS  --  8.0* 5.5* 5.2*    GFR: Estimated Creatinine Clearance: 10.2 mL/min (A) (by C-G formula based on SCr of 6.59 mg/dL (H)).  Liver Function Tests: Recent Labs  Lab 03/13/21 0727 03/14/21 0742 03/16/21 0753  ALBUMIN 3.5 3.5 3.5    Radiology Studies: No results found.   Scheduled Meds:  (feeding supplement) PROSource Plus  30 mL Oral TID BM   amiodarone  200 mg Oral Daily   amLODipine  10 mg Oral Daily   atorvastatin  80 mg Oral Q supper   calcitRIOL  1 mcg Oral Q M,W,F-HD   Chlorhexidine Gluconate Cloth  6 each Topical Q0600   Chlorhexidine Gluconate Cloth  6 each Topical Q0600   darbepoetin (ARANESP) injection - DIALYSIS  100 mcg Intravenous Q Mon-HD   heparin  5,000 Units Subcutaneous Q12H   hydrALAZINE  25 mg Oral Q8H   lanthanum  2,000 mg Oral TID WC   pantoprazole  40 mg Oral Daily   senna-docusate  1 tablet Oral BID   vitamin B-12  1,000 mcg Oral QPM   Continuous Infusions:    Bonnielee Haff, MD [facility] 03/16/2021, 10:59 AM

## 2021-03-17 ENCOUNTER — Encounter (HOSPITAL_COMMUNITY)
Admission: EM | Disposition: A | Discharge status: Skilled Nursing Facility | Payer: Self-pay | Source: Home / Self Care | Attending: Internal Medicine

## 2021-03-17 ENCOUNTER — Inpatient Hospital Stay (HOSPITAL_COMMUNITY): Payer: Medicare Other | Admitting: Certified Registered Nurse Anesthetist

## 2021-03-17 ENCOUNTER — Encounter (HOSPITAL_COMMUNITY): Payer: Self-pay | Admitting: Internal Medicine

## 2021-03-17 ENCOUNTER — Inpatient Hospital Stay (HOSPITAL_COMMUNITY): Payer: Medicare Other

## 2021-03-17 HISTORY — PX: ANTERIOR CERVICAL DECOMP/DISCECTOMY FUSION: SHX1161

## 2021-03-17 LAB — CBC
HCT: 33.7 % — ABNORMAL LOW (ref 39.0–52.0)
Hemoglobin: 10.8 g/dL — ABNORMAL LOW (ref 13.0–17.0)
MCH: 31.3 pg (ref 26.0–34.0)
MCHC: 32 g/dL (ref 30.0–36.0)
MCV: 97.7 fL (ref 80.0–100.0)
Platelets: 118 10*3/uL — ABNORMAL LOW (ref 150–400)
RBC: 3.45 MIL/uL — ABNORMAL LOW (ref 4.22–5.81)
RDW: 15.8 % — ABNORMAL HIGH (ref 11.5–15.5)
WBC: 4.9 10*3/uL (ref 4.0–10.5)
nRBC: 0 % (ref 0.0–0.2)

## 2021-03-17 LAB — RENAL FUNCTION PANEL
Albumin: 3.4 g/dL — ABNORMAL LOW (ref 3.5–5.0)
Anion gap: 13 (ref 5–15)
BUN: 36 mg/dL — ABNORMAL HIGH (ref 8–23)
CO2: 26 mmol/L (ref 22–32)
Calcium: 9.2 mg/dL (ref 8.9–10.3)
Chloride: 96 mmol/L — ABNORMAL LOW (ref 98–111)
Creatinine, Ser: 8.2 mg/dL — ABNORMAL HIGH (ref 0.61–1.24)
GFR, Estimated: 7 mL/min — ABNORMAL LOW (ref >60)
Glucose, Bld: 92 mg/dL (ref 70–99)
Phosphorus: 6.5 mg/dL — ABNORMAL HIGH (ref 2.5–4.6)
Potassium: 3.8 mmol/L (ref 3.5–5.1)
Sodium: 135 mmol/L (ref 135–145)

## 2021-03-17 SURGERY — ANTERIOR CERVICAL DECOMPRESSION/DISCECTOMY FUSION 3 LEVELS
Anesthesia: General | Site: Neck

## 2021-03-17 MED ORDER — ROCURONIUM BROMIDE 10 MG/ML (PF) SYRINGE
PREFILLED_SYRINGE | INTRAVENOUS | Status: AC
  Filled 2021-03-17: qty 10

## 2021-03-17 MED ORDER — SODIUM CHLORIDE 0.9 % IV SOLN: INTRAVENOUS | Status: DC

## 2021-03-17 MED ORDER — FENTANYL CITRATE (PF) 250 MCG/5ML IJ SOLN
INTRAMUSCULAR | Status: DC | PRN
  Administered 2021-03-17 (×2): 50 ug via INTRAVENOUS
  Administered 2021-03-17: 25 ug via INTRAVENOUS
  Administered 2021-03-17: 100 ug via INTRAVENOUS

## 2021-03-17 MED ORDER — LIDOCAINE 2% (20 MG/ML) 5 ML SYRINGE
INTRAMUSCULAR | Status: DC | PRN
Start: 2021-03-17 — End: 2021-03-17
  Administered 2021-03-17: 4 mg via INTRAVENOUS

## 2021-03-17 MED ORDER — PROPOFOL 10 MG/ML IV BOLUS
INTRAVENOUS | Status: AC
  Filled 2021-03-17: qty 20

## 2021-03-17 MED ORDER — THROMBIN 20000 UNITS EX SOLR
CUTANEOUS | Status: AC
  Filled 2021-03-17: qty 20000

## 2021-03-17 MED ORDER — PHENYLEPHRINE 40 MCG/ML (10ML) SYRINGE FOR IV PUSH (FOR BLOOD PRESSURE SUPPORT)
PREFILLED_SYRINGE | INTRAVENOUS | Status: DC | PRN
  Administered 2021-03-17: 40 ug via INTRAVENOUS

## 2021-03-17 MED ORDER — CEFAZOLIN SODIUM-DEXTROSE 2-4 GM/100ML-% IV SOLN
INTRAVENOUS | Status: AC
  Filled 2021-03-17: qty 100

## 2021-03-17 MED ORDER — BUPIVACAINE HCL 0.5 % IJ SOLN
INTRAMUSCULAR | Status: DC | PRN
  Administered 2021-03-17: 3.5 mL

## 2021-03-17 MED ORDER — ORAL CARE MOUTH RINSE: 15.0000 mL | Freq: Once | OROMUCOSAL | Status: AC

## 2021-03-17 MED ORDER — CEFAZOLIN SODIUM-DEXTROSE 2-4 GM/100ML-% IV SOLN
2.0000 g | Freq: Once | INTRAVENOUS | Status: AC
  Administered 2021-03-18: 2 g via INTRAVENOUS
  Filled 2021-03-17: qty 100

## 2021-03-17 MED ORDER — PROPOFOL 10 MG/ML IV BOLUS
INTRAVENOUS | Status: DC | PRN
  Administered 2021-03-17: 100 mg via INTRAVENOUS

## 2021-03-17 MED ORDER — CHLORHEXIDINE GLUCONATE CLOTH 2 % EX PADS
6.0000 | MEDICATED_PAD | Freq: Every day | CUTANEOUS | Status: DC
  Administered 2021-03-18 – 2021-03-22 (×4): 6 via TOPICAL

## 2021-03-17 MED ORDER — PHENOL 1.4 % MT LIQD
1.0000 | OROMUCOSAL | Status: DC | PRN
  Administered 2021-03-17 – 2021-03-18 (×3): 1 via OROMUCOSAL
  Filled 2021-03-17: qty 177

## 2021-03-17 MED ORDER — CHLORHEXIDINE GLUCONATE 0.12 % MT SOLN
OROMUCOSAL | Status: AC
  Administered 2021-03-17: 15 mL via OROMUCOSAL
  Filled 2021-03-17: qty 15

## 2021-03-17 MED ORDER — LACTATED RINGERS IV SOLN: INTRAVENOUS | Status: DC

## 2021-03-17 MED ORDER — THROMBIN 5000 UNITS EX SOLR: OROMUCOSAL | Status: DC | PRN

## 2021-03-17 MED ORDER — OXYCODONE HCL 5 MG PO TABS: 5.0000 mg | ORAL_TABLET | Freq: Once | ORAL | Status: DC | PRN

## 2021-03-17 MED ORDER — BUPIVACAINE HCL (PF) 0.5 % IJ SOLN
INTRAMUSCULAR | Status: AC
  Filled 2021-03-17: qty 30

## 2021-03-17 MED ORDER — ONDANSETRON HCL 4 MG/2ML IJ SOLN: 4.0000 mg | Freq: Four times a day (QID) | INTRAMUSCULAR | Status: DC | PRN

## 2021-03-17 MED ORDER — CHLORHEXIDINE GLUCONATE CLOTH 2 % EX PADS: 6.0000 | MEDICATED_PAD | Freq: Once | CUTANEOUS | Status: DC

## 2021-03-17 MED ORDER — FENTANYL CITRATE (PF) 250 MCG/5ML IJ SOLN
INTRAMUSCULAR | Status: AC
  Filled 2021-03-17: qty 5

## 2021-03-17 MED ORDER — EPHEDRINE SULFATE-NACL 50-0.9 MG/10ML-% IV SOSY
PREFILLED_SYRINGE | INTRAVENOUS | Status: DC | PRN
Start: 2021-03-17 — End: 2021-03-17
  Administered 2021-03-17 (×2): 2.5 mg via INTRAVENOUS

## 2021-03-17 MED ORDER — SODIUM CHLORIDE 0.9% FLUSH
3.0000 mL | Freq: Two times a day (BID) | INTRAVENOUS | Status: DC
  Administered 2021-03-17 – 2021-03-24 (×15): 3 mL via INTRAVENOUS

## 2021-03-17 MED ORDER — DEXAMETHASONE SODIUM PHOSPHATE 10 MG/ML IJ SOLN
INTRAMUSCULAR | Status: DC | PRN
Start: 2021-03-17 — End: 2021-03-17
  Administered 2021-03-17: 10 mg via INTRAVENOUS

## 2021-03-17 MED ORDER — SUGAMMADEX SODIUM 200 MG/2ML IV SOLN
INTRAVENOUS | Status: DC | PRN
  Administered 2021-03-17: 200 mg via INTRAVENOUS

## 2021-03-17 MED ORDER — 0.9 % SODIUM CHLORIDE (POUR BTL) OPTIME
TOPICAL | Status: DC | PRN
  Administered 2021-03-17: 1000 mL

## 2021-03-17 MED ORDER — ONDANSETRON HCL 4 MG/2ML IJ SOLN
INTRAMUSCULAR | Status: DC | PRN
  Administered 2021-03-17: 4 mg via INTRAVENOUS

## 2021-03-17 MED ORDER — FENTANYL CITRATE (PF) 100 MCG/2ML IJ SOLN
INTRAMUSCULAR | Status: AC
  Filled 2021-03-17: qty 2

## 2021-03-17 MED ORDER — ROCURONIUM BROMIDE 10 MG/ML (PF) SYRINGE
PREFILLED_SYRINGE | INTRAVENOUS | Status: DC | PRN
  Administered 2021-03-17: 10 mg via INTRAVENOUS
  Administered 2021-03-17: 50 mg via INTRAVENOUS
  Administered 2021-03-17: 20 mg via INTRAVENOUS

## 2021-03-17 MED ORDER — CHLORHEXIDINE GLUCONATE CLOTH 2 % EX PADS
6.0000 | MEDICATED_PAD | Freq: Once | CUTANEOUS | Status: AC
  Administered 2021-03-17: 6 via TOPICAL

## 2021-03-17 MED ORDER — DEXAMETHASONE SODIUM PHOSPHATE 10 MG/ML IJ SOLN
INTRAMUSCULAR | Status: AC
  Filled 2021-03-17: qty 1

## 2021-03-17 MED ORDER — LIDOCAINE 2% (20 MG/ML) 5 ML SYRINGE
INTRAMUSCULAR | Status: AC
  Filled 2021-03-17: qty 5

## 2021-03-17 MED ORDER — SODIUM CHLORIDE 0.9 % IV SOLN: 250.0000 mL | INTRAVENOUS | Status: DC

## 2021-03-17 MED ORDER — LIDOCAINE-EPINEPHRINE 1 %-1:100000 IJ SOLN
INTRAMUSCULAR | Status: DC | PRN
Start: 2021-03-17 — End: 2021-03-17
  Administered 2021-03-17: 3.5 mL

## 2021-03-17 MED ORDER — MENTHOL 3 MG MT LOZG
1.0000 | LOZENGE | OROMUCOSAL | Status: DC | PRN
  Filled 2021-03-17: qty 9

## 2021-03-17 MED ORDER — FENTANYL CITRATE (PF) 100 MCG/2ML IJ SOLN
25.0000 ug | INTRAMUSCULAR | Status: DC | PRN
  Administered 2021-03-17: 25 ug via INTRAVENOUS

## 2021-03-17 MED ORDER — OXYCODONE HCL 5 MG/5ML PO SOLN: 5.0000 mg | Freq: Once | ORAL | Status: DC | PRN

## 2021-03-17 MED ORDER — CEFAZOLIN SODIUM-DEXTROSE 2-4 GM/100ML-% IV SOLN
2.0000 g | INTRAVENOUS | Status: AC
  Administered 2021-03-17: 2 g via INTRAVENOUS

## 2021-03-17 MED ORDER — ONDANSETRON HCL 4 MG/2ML IJ SOLN
INTRAMUSCULAR | Status: AC
  Filled 2021-03-17: qty 2

## 2021-03-17 MED ORDER — THROMBIN 20000 UNITS EX SOLR: CUTANEOUS | Status: DC | PRN

## 2021-03-17 MED ORDER — SODIUM CHLORIDE 0.9 % IV SOLN
INTRAVENOUS | Status: DC | PRN
Start: 2021-03-17 — End: 2021-03-17

## 2021-03-17 MED ORDER — CHLORHEXIDINE GLUCONATE 0.12 % MT SOLN: 15.0000 mL | Freq: Once | OROMUCOSAL | Status: AC

## 2021-03-17 MED ORDER — THROMBIN 5000 UNITS EX SOLR
CUTANEOUS | Status: AC
  Filled 2021-03-17: qty 15000

## 2021-03-17 MED ORDER — DOCUSATE SODIUM 100 MG PO CAPS
100.0000 mg | ORAL_CAPSULE | Freq: Two times a day (BID) | ORAL | Status: DC
  Administered 2021-03-17 – 2021-03-24 (×11): 100 mg via ORAL
  Filled 2021-03-17 (×12): qty 1

## 2021-03-17 MED ORDER — PHENYLEPHRINE HCL-NACL 20-0.9 MG/250ML-% IV SOLN
INTRAVENOUS | Status: DC | PRN
  Administered 2021-03-17: 50 ug/min via INTRAVENOUS

## 2021-03-17 MED ORDER — LIDOCAINE-EPINEPHRINE 1 %-1:100000 IJ SOLN
INTRAMUSCULAR | Status: AC
  Filled 2021-03-17: qty 1

## 2021-03-17 MED ORDER — SODIUM CHLORIDE 0.9% FLUSH: 3.0000 mL | INTRAVENOUS | Status: DC | PRN

## 2021-03-17 SURGICAL SUPPLY — 65 items
ADH SKN CLS APL DERMABOND .7 (GAUZE/BANDAGES/DRESSINGS) ×1
APL SKNCLS STERI-STRIP NONHPOA (GAUZE/BANDAGES/DRESSINGS)
BAG COUNTER SPONGE SURGICOUNT (BAG) ×3 IMPLANT
BAG SPNG CNTER NS LX DISP (BAG) ×1
BAND INSRT 18 STRL LF DISP RB (MISCELLANEOUS) ×2
BAND RUBBER #18 3X1/16 STRL (MISCELLANEOUS) ×6 IMPLANT
BENZOIN TINCTURE PRP APPL 2/3 (GAUZE/BANDAGES/DRESSINGS) IMPLANT
BIT DRILL 13 (BIT) ×1 IMPLANT
BLADE CLIPPER SURG (BLADE) IMPLANT
BLADE SURG 11 STRL SS (BLADE) ×3 IMPLANT
BLADE ULTRA TIP 2M (BLADE) IMPLANT
BUR MATCHSTICK NEURO 3.0 LAGG (BURR) ×3 IMPLANT
CAGE CERV NL MED 5 6D (Cage) ×1 IMPLANT
CANISTER SUCT 3000ML PPV (MISCELLANEOUS) ×3 IMPLANT
CARTRIDGE OIL MAESTRO DRILL (MISCELLANEOUS) ×2 IMPLANT
DECANTER SPIKE VIAL GLASS SM (MISCELLANEOUS) ×3 IMPLANT
DERMABOND ADVANCED (GAUZE/BANDAGES/DRESSINGS) ×1
DERMABOND ADVANCED .7 DNX12 (GAUZE/BANDAGES/DRESSINGS) ×2 IMPLANT
DEVICE ENDSKLTN TC NANOLCK 6MM (Cage) IMPLANT
DIFFUSER DRILL AIR PNEUMATIC (MISCELLANEOUS) ×3 IMPLANT
DRAIN CHANNEL 10M FLAT 3/4 FLT (DRAIN) IMPLANT
DRAPE C-ARM 42X72 X-RAY (DRAPES) ×6 IMPLANT
DRAPE HALF SHEET 40X57 (DRAPES) IMPLANT
DRAPE LAPAROTOMY 100X72 PEDS (DRAPES) ×3 IMPLANT
DRAPE MICROSCOPE LEICA (MISCELLANEOUS) ×3 IMPLANT
DRSG OPSITE POSTOP 3X4 (GAUZE/BANDAGES/DRESSINGS) ×5 IMPLANT
DURAPREP 6ML APPLICATOR 50/CS (WOUND CARE) ×3 IMPLANT
ELECT COATED BLADE 2.86 ST (ELECTRODE) ×3 IMPLANT
ENDOSKELETON TC NANOLOCK 6MM (Cage) ×4 IMPLANT
EVACUATOR SILICONE 100CC (DRAIN) IMPLANT
GAUZE 4X4 16PLY ~~LOC~~+RFID DBL (SPONGE) IMPLANT
GLOVE EXAM NITRILE XL STR (GLOVE) IMPLANT
GLOVE SURG ENC MOIS LTX SZ7.5 (GLOVE) IMPLANT
GLOVE SURG LTX SZ7 (GLOVE) ×3 IMPLANT
GLOVE SURG UNDER POLY LF SZ7.5 (GLOVE) ×6 IMPLANT
GOWN STRL REUS W/ TWL LRG LVL3 (GOWN DISPOSABLE) ×4 IMPLANT
GOWN STRL REUS W/ TWL XL LVL3 (GOWN DISPOSABLE) IMPLANT
GOWN STRL REUS W/TWL 2XL LVL3 (GOWN DISPOSABLE) IMPLANT
GOWN STRL REUS W/TWL LRG LVL3 (GOWN DISPOSABLE) ×4
GOWN STRL REUS W/TWL XL LVL3 (GOWN DISPOSABLE)
HEMOSTAT POWDER KIT SURGIFOAM (HEMOSTASIS) ×3 IMPLANT
KIT BASIN OR (CUSTOM PROCEDURE TRAY) ×3 IMPLANT
KIT TURNOVER KIT B (KITS) ×3 IMPLANT
NDL SPNL 22GX3.5 QUINCKE BK (NEEDLE) ×2 IMPLANT
NEEDLE HYPO 22GX1.5 SAFETY (NEEDLE) ×3 IMPLANT
NEEDLE SPNL 22GX3.5 QUINCKE BK (NEEDLE) ×2 IMPLANT
NS IRRIG 1000ML POUR BTL (IV SOLUTION) ×3 IMPLANT
OIL CARTRIDGE MAESTRO DRILL (MISCELLANEOUS) ×2
PACK LAMINECTOMY NEURO (CUSTOM PROCEDURE TRAY) ×3 IMPLANT
PAD ARMBOARD 7.5X6 YLW CONV (MISCELLANEOUS) ×9 IMPLANT
PENCIL BUTTON HOLSTER BLD 10FT (ELECTRODE) ×1 IMPLANT
PLATE ZEVO 1LVL 15MM (Plate) ×1 IMPLANT
PLATE ZEVO 1LVL 17MM (Plate) ×2 IMPLANT
PUTTY DBM GRAFTON 5CC (Putty) ×1 IMPLANT
SCREW 3.5 SELFDRILL 15MM VARI (Screw) ×8 IMPLANT
SCREW 4.0 SELFDRILL 15MM VARI (Screw) ×4 IMPLANT
SPONGE INTESTINAL PEANUT (DISPOSABLE) ×3 IMPLANT
SPONGE SURGIFOAM ABS GEL 100 (HEMOSTASIS) ×3 IMPLANT
STRIP CLOSURE SKIN 1/2X4 (GAUZE/BANDAGES/DRESSINGS) IMPLANT
SUT VIC AB 3-0 SH 8-18 (SUTURE) ×3 IMPLANT
SUT VICRYL 3-0 RB1 18 ABS (SUTURE) ×3 IMPLANT
TAPE CLOTH 3X10 TAN LF (GAUZE/BANDAGES/DRESSINGS) ×3 IMPLANT
TOWEL GREEN STERILE (TOWEL DISPOSABLE) ×3 IMPLANT
TOWEL GREEN STERILE FF (TOWEL DISPOSABLE) ×3 IMPLANT
WATER STERILE IRR 1000ML POUR (IV SOLUTION) ×3 IMPLANT

## 2021-03-17 NOTE — Op Note (Signed)
NEUROSURGERY OPERATIVE NOTE   PREOP DIAGNOSIS: Cervical Spondylosis with myelopathy, C3-4, C4-5, C5-6  POSTOP DIAGNOSIS: Same  PROCEDURE: 1. Discectomy at C3-4, C4-5, C5-6 for decompression of spinal cord and exiting nerve roots  2. Placement of intervertebral biomechanical device, Medtronic Titan medium width 31mm @ C3-4, 81mm @ C4-5, 52mm @ C5-6 3. Placement of anterior instrumentation consisting of interbody plate and screws: Medtronic Zevo 59mm plate, 60mm plate, 76mm plate, 46mm screws 4. Use of morselized bone allograft  5. Arthrodesis C3-4, C4-5, C5-6, anterior interbody technique  6. Use of intraoperative microscope  SURGEON: Dr. Consuella Lose, MD  ASSISTANT: Dr. Newman Pies, MD  ANESTHESIA: General Endotracheal  EBL: 100cc  SPECIMENS: None  DRAINS: None  COMPLICATIONS: None immediate  CONDITION: Hemodynamically stable to PACU  HISTORY: Jose Garrett. is a 71 y.o. man initially seen in consultation in the hospital for progressively worsening right upper and lower extremity weakness.  Patient has multiple medical comorbidities but was at his baseline state of health until approximately 4 to 5 weeks ago when he began to notice progressively worsening weakness in his left leg.  He does have a right below the knee amputation but was able to walk with a prosthetic.  A few weeks ago he lost the ability to walk and has been using essentially wheelchair.  He also began to notice progressively worsening incoordination and clumsiness with his right hand primarily.  Upon his arrival to the emergency department MRI was completed demonstrating significant primarily degenerative cervical stenosis with spinal cord compression at C3-4 C4-5 and C5-6.  Given his multiple medical comorbidities we did have a lengthy discussion regarding the risks of surgery versus the benefits.  After speaking with his family and considering his options over several days he elected to  proceed.  PROCEDURE IN DETAIL: The patient was brought to the operating room and transferred to the operative table. After induction of general anesthesia, the patient was positioned on the operative table in the supine position with all pressure points meticulously padded. The skin of the neck was then prepped and draped in the usual sterile fashion.  After timeout was conducted, the skin was infiltrated with local anesthetic. Right-sided transverse skin incision was then made sharply and Bovie electrocautery was used to dissect the subcutaneous tissue until the platysma was identified. The platysma was then divided and undermined. The sternocleidomastoid muscle was then identified and, utilizing natural fascial planes in the neck, the prevertebral fascia was identified and the carotid sheath was retracted laterally and the trachea and esophagus retracted medially.  Spinal needle was introduced and lateral fluoroscopic image confirmed our location at the C4-5 interspace.  Bovie electrocautery was used to dissect in the subperiosteal plane and elevate the bilateral longus coli muscles both inferiorly and superiorly in order to identify the C3-4 and C5-6 disc spaces.  Table mounted retractors were then placed. At this point, the microscope was draped and brought into the field, and the remainder of the case was done under the microscope using microdissecting technique.  The C3-4 disc space was incised sharply and rongeurs were use to initially complete a discectomy. The high-speed drill was then used to complete discectomy until the posterior annulus was identified and removed and the posterior longitudinal ligament was identified. Using a nerve hook, the PLL was elevated, and Kerrison rongeurs were used to remove the posterior longitudinal ligament and the ventral thecal sac was identified. Using a combination of curettes and rongeurs, complete decompression of the thecal sac and  exiting nerve roots at this  level was completed, including removal of posterior osteophytes from the C3 and C4 vertebral bodies.  At this point, a medium with 6 mm tall lordotic interbody cage was sized and packed with morcellized bone allograft. This was then inserted and tapped into place.  A 17 mm plate was then placed across the C3-4 interspace and 15 mm screws were placed in C3 and C4.  Attention was then turned to the C4-5 level. In a similar fashion, discectomy was completed initially with curettes and rongeurs, and completed with the drill. The PLL was again identified, elevated and incised. Using Kerrison rongeurs, the thecal sac was decompressed including removal of thickened posterior longitudinal ligament and posterior osteophytes from C4 and C5.  A 5 mm medium with lordotic interbody cage was then sized and filled with bone allograft, and tapped into place.  A 15 mm plate was then placed across the C4-5 interspace and secured with 15 mm screws in C4 and C5.  Attention was then turned to the C5-6 level. In a similar fashion, discectomy was completed initially with curettes and rongeurs, and completed with the drill. The PLL was again identified, elevated and incised.  Kerrison punches were then used to remove large osteophytes and thickened posterior longitudinal ligament.  A 6 mm medium with lordotic interbody cage was then sized and filled with bone allograft, and tapped into place.  17 mm plate was then placed across the C5-6 interspace and secured with 15 mm screws.  The right-sided inferior screw appeared to be slightly medially positioned and precluded positioning of the locking mechanism on the plate.  I therefore remove the plate and the screws, and rescue screws were placed in C5 and C6 measuring 15 mm.  The new plate was then locked.  Final lateral fluoroscopic image confirmed good location of the implanted hardware and good cervical alignment.    At this point, after all counts were verified to be correct,  meticulous hemostasis was secured using a combination of bipolar electrocautery and passive hemostatics. The platysma muscle was then closed using interrupted 3-0 Vicryl sutures, and the skin was closed with a interrupted subcuticular stitch. Sterile dressings were then applied and the drapes removed.  The patient tolerated the procedure well and was extubated in the room and taken to the postanesthesia care unit in stable condition.   Consuella Lose, MD [facility]

## 2021-03-17 NOTE — Progress Notes (Signed)
Pt returned from PACU at this time.  Alert and oriented, but lethargic. VSS. Aspen collar in place. Honeycomb dressing to neck CDI.  Bed alarm set; call bell within reach.

## 2021-03-17 NOTE — Assessment & Plan Note (Signed)
Seen by vascular surgery as an outpatient, status post BKA on the right. He has a femoropopliteal bypass graft on the left.

## 2021-03-17 NOTE — Assessment & Plan Note (Addendum)
Most recent cath was done in May 2020 which showed severe single-vessel CAD with chronic total occlusion of the RCA, with collaterals. Mild calcific nonobstructive LCA disease.  Continue statin. Plavix held on 03/11/2021 in preparation for neurosurgery on 03/17/2021. Discussed with Dr. Arnoldo Morale regarding resumption of Plavix.  He mentioned that it would be okay to resume after Monday. Plan was to resume Plavix today but since patient is experiencing dysphagia we will hold off for now in case he needs further interventions.

## 2021-03-17 NOTE — Assessment & Plan Note (Addendum)
He is on a Monday Wednesday Friday schedule.  Nephrology is following.

## 2021-03-17 NOTE — Assessment & Plan Note (Signed)
Etiology unclear.  Counts have been stable.  Continue to monitor intermittently.

## 2021-03-17 NOTE — Anesthesia Preprocedure Evaluation (Signed)
Anesthesia Evaluation  Patient identified by MRN, date of birth, ID band Patient awake    Reviewed: Allergy & Precautions, H&P , NPO status , Patient's Chart, lab work & pertinent test results  History of Anesthesia Complications (+) PONV and history of anesthetic complications  Airway Mallampati: II   Neck ROM: full    Dental   Pulmonary former smoker,    breath sounds clear to auscultation       Cardiovascular hypertension, + CAD, + Past MI, + Peripheral Vascular Disease and +CHF  + dysrhythmias Atrial Fibrillation + Valvular Problems/Murmurs  Rhythm:regular Rate:Normal  S/p TAVR  TTE (10/2019): EF 45-50%, normal function of TAVR.   Neuro/Psych  Headaches, PSYCHIATRIC DISORDERS Anxiety    GI/Hepatic GERD  ,  Endo/Other    Renal/GU ESRF and DialysisRenal disease     Musculoskeletal  (+) Arthritis ,   Abdominal   Peds  Hematology   Anesthesia Other Findings   Reproductive/Obstetrics                             Anesthesia Physical Anesthesia Plan  ASA: 4  Anesthesia Plan: General   Post-op Pain Management:    Induction: Intravenous  PONV Risk Score and Plan: 3 and Ondansetron, Dexamethasone, Treatment may vary due to age or medical condition and Midazolam  Airway Management Planned: Oral ETT  Additional Equipment:   Intra-op Plan:   Post-operative Plan: Extubation in OR  Informed Consent: I have reviewed the patients History and Physical, chart, labs and discussed the procedure including the risks, benefits and alternatives for the proposed anesthesia with the patient or authorized representative who has indicated his/her understanding and acceptance.     Dental advisory given  Plan Discussed with: CRNA, Anesthesiologist and Surgeon  Anesthesia Plan Comments:         Anesthesia Quick Evaluation

## 2021-03-17 NOTE — Plan of Care (Signed)
°  Problem: Activity: Goal: Ability to perform activities at highest level will improve Outcome: Progressing Goal: Muscle strength will improve Outcome: Progressing   Problem: Education: Goal: Knowledge of disease or condition will improve Outcome: Progressing   Problem: Coping: Goal: Ability to identify and develop effective coping behavior will improve Outcome: Progressing   Problem: Self-Care: Goal: Ability to participate in self-care as condition permits will improve Outcome: Progressing   Problem: Skin Integrity: Goal: Risk for impaired skin integrity will decrease Outcome: Progressing

## 2021-03-17 NOTE — Assessment & Plan Note (Addendum)
-  MRI on admission showed advanced degenerative changes resulting in severe spinal canal stenosis with cord compression from C3-4 through C5-6.  -Neurosurgery was consulted.  Patient underwent ACDF C3-4, C4-5 C5-6 on 03/17/2021. Stable from a neurological standpoint.  However patient experiencing significant dysphagia.  Speech therapy is following.  See below. Will request neurosurgery to reevaluate patient today.

## 2021-03-17 NOTE — Progress Notes (Signed)
Nephrology Progress Note:   Patient ID: Jose Garrett., male   DOB: Nov 24, 1950, 71 y.o.   MRN: [id number]  Subjective:  Last HD on 1/4 with 1 kg UF.  Just came back from surgery recently for cervical spondylosis with myelopathy. He's sleepy but answers my questions and is listening to tv with eyes closed.   Review of systems:  Denies shortness of breath  Denies chest pain  No n/v Neck sore   O:BP (!) 154/51 (BP Location: Left Arm)    Pulse 85    Temp 98.1 F (36.7 C) (Axillary)    Resp 16    Ht 5\' 10"  (1.778 m)    Wt 68 kg    SpO2 100%    BMI 21.51 kg/m   Intake/Output Summary (Last 24 hours) at 03/17/2021 1629 Last data filed at 03/17/2021 1425 Gross per 24 hour  Intake 565 ml  Output 100 ml  Net 465 ml     Intake/Output: I/O last 3 completed shifts: In: 360 [P.O.:360] Out: -   Intake/Output this shift:  Total I/O In: 565 [I.V.:465; IV Piggyback:100] Out: 100 [Blood:100] Weight change:   Physical exam: Gen: adult male in bed in NAD HEENT in a cervical collar CVS: S1S2 no rub Resp:CTA and normal work of breathing on 2 liters  Abd: soft, NT/nd Ext: rt bka, no edema Neuro: awakens to voice; listening to tv with eyes closed; alert and oriented x 3  Psych Normal mood and affect Dialysis access: R AVF with bruit and thrill   OP HD: MWF [facility]  4h  400/1.5  71.9kg  2/2 bath  Hep 2400  RUA AVF Mircera 75 mcg q4wks - last 02/15/21 Calcitriol 15mcg PO qHD-last 02/27/21      Assessment/Plan: Spinal Cord Compression-imaging shows advanced degenerative changes and spinal canal stenosis with cord compression. Neurosurgery consulted. C-spine surgery earlier today   ESRD - has been HD per usual MWF schedule.  Next HD will be Sat 1/7 off schedule to accommodate surgery above.  Renal panel daily  Hypertension/volume- on norvasc here. No vol excess on exam, under dry wt 3+kg (would not lower EDW to his nadir here).  Unable to lower further - feel he would not tolerate.  Started  hydralazine at 25 mg TID this admit.  Perioperative BP goals per neurosurgery and primary team  Anemia of CKD - continue ESA - aranesp 100 mcg every monday Secondary Hyperparathyroidism - hyperphos; improved on lanthanum. Resumed calcitriol   Disposition - per primary team - awaiting surgery on 1/6 per pt  Studies/Results: DG Cervical Spine 1 View  Result Date: 03/17/2021 CLINICAL DATA:  ACDF EXAM: DG CERVICAL SPINE - 1 VIEW COMPARISON:  None. FINDINGS: A single intraoperative fluoroscopic spot film was submitted for review. Images demonstrate a lateral view of the cervical spine with visualization to the level of the C6 vertebral body. There is anterior cervical fusion hardware spanning C3-C6. Intervertebral disc spacers are present at these levels. No abnormal alignment of the spine. Endotracheal tube and surgical sponges are within the field of view. IMPRESSION: Intraoperative fluoroscopic film during ACDF spanning C3-C6 as described. Refer to operative report for full details. Electronically Signed   By: Albin Felling M.D.   On: 03/17/2021 13:02   DG C-Arm 1-60 Min-No Report  Result Date: 03/17/2021 Fluoroscopy was utilized by the requesting physician.  No radiographic interpretation.   DG C-Arm 1-60 Min-No Report  Result Date: 03/17/2021 Fluoroscopy was utilized by the requesting physician.  No radiographic  interpretation.   DG C-Arm 1-60 Min-No Report  Result Date: 03/17/2021 Fluoroscopy was utilized by the requesting physician.  No radiographic interpretation.    (feeding supplement) PROSource Plus  30 mL Oral TID BM   amiodarone  200 mg Oral Daily   amLODipine  10 mg Oral Daily   atorvastatin  80 mg Oral Q supper   calcitRIOL  1 mcg Oral Q M,W,F-HD   Chlorhexidine Gluconate Cloth  6 each Topical Q0600   Chlorhexidine Gluconate Cloth  6 each Topical Q0600   darbepoetin (ARANESP) injection - DIALYSIS  100 mcg Intravenous Q Mon-HD   docusate sodium  100 mg Oral BID   fentaNYL        heparin  5,000 Units Subcutaneous Q12H   hydrALAZINE  25 mg Oral Q8H   lanthanum  2,000 mg Oral TID WC   pantoprazole  40 mg Oral Daily   polyethylene glycol  17 g Oral Daily   senna-docusate  2 tablet Oral BID   sodium chloride flush  3 mL Intravenous Q12H   vitamin B-12  1,000 mcg Oral QPM     Claudia Desanctis, MD 03/17/2021 4:37 PM

## 2021-03-17 NOTE — Assessment & Plan Note (Signed)
Hemoglobin has been stable.  Monitoring intermittently.

## 2021-03-17 NOTE — Hospital Course (Addendum)
71 year old male with amyloidosis, chronic combined CHF, ESRD, CAD, peripheral vascular disease status post right BKA, severe AS status post TAVR presented to the hospital with increased weakness of bilateral arms, hands, fingers as well as wheelchair-bound for the past 3 weeks.  Patient has been unable to lift his arms above the shoulder which is completely new.  Imaging in the ED showed subacute spinal cord compression.  Neurosurgery and nephrology were consulted.  Patient took some time to come to a decision regarding surgery.  Underwent surgery on 03/17/2021. Patient continues to have neck pain along with difficulty swallowing.  Experiencing acid reflux as well.  Has not noted any improvement in his swallowing difficulty.  Occasionally tends to regurgitate.

## 2021-03-17 NOTE — Progress Notes (Signed)
Manufacturing engineer [facility]) Community Based Palliative Care     This patient is pending enrollment in our palliative care services in the community.  [facility] will continue to follow for any discharge planning needs and to coordinate continuation of palliative care.   If you have questions or need assistance, please call [phone] or contact the hospital Liaison listed on AMION.      Farrel Gordon, RN, [facility] Liaison

## 2021-03-17 NOTE — Assessment & Plan Note (Addendum)
Patient remains on amlodipine and hydralazine.  Blood pressure remains poorly controlled.  His discomfort likely is contributing.  Increase the dose of his hydralazine.  Will not be too aggressive since he is on hemodialysis.

## 2021-03-17 NOTE — Transfer of Care (Signed)
Immediate Anesthesia Transfer of Care Note  Patient: Jose Garrett.  Procedure(s) Performed: Cervical three to cervial six anterior cervical decompression, discectomy and fusion (Neck)  Patient Location: PACU  Anesthesia Type:General  Level of Consciousness: drowsy and patient cooperative  Airway & Oxygen Therapy: Patient Spontanous Breathing and Patient connected to face mask oxygen  Post-op Assessment: Report given to RN and Post -op Vital signs reviewed and stable  Post vital signs: Reviewed and stable  Last Vitals:  Vitals Value Taken Time  BP 167/55 03/17/21 1308  Temp    Pulse 82 03/17/21 1311  Resp 22 03/17/21 1311  SpO2 100 % 03/17/21 1311  Vitals shown include unvalidated device data.  Last Pain:  Vitals:   03/17/21 0816  TempSrc:   PainSc: 0-No pain      Patients Stated Pain Goal: 0 (47/42/59 [id number])  Complications: No notable events documented.

## 2021-03-17 NOTE — Assessment & Plan Note (Addendum)
Most recent 2D echo in August 2021 showed an EF of 45-50%, mild decreased LV function with global hypokinesis. There was also grade 1 diastolic dysfunction. RV was mildly reduced as well. Volume managed by dialysis.

## 2021-03-17 NOTE — Assessment & Plan Note (Signed)
Stable

## 2021-03-17 NOTE — Anesthesia Procedure Notes (Signed)
Procedure Name: Intubation Date/Time: 03/17/2021 9:11 AM Performed by: Janene Harvey, CRNA Pre-anesthesia Checklist: Patient identified, Emergency Drugs available, Suction available and Patient being monitored Patient Re-evaluated:Patient Re-evaluated prior to induction Oxygen Delivery Method: Circle system utilized Preoxygenation: Pre-oxygenation with 100% oxygen Induction Type: IV induction Ventilation: Mask ventilation without difficulty and Oral airway inserted - appropriate to patient size Laryngoscope Size: Glidescope and 3 Grade View: Grade I Tube type: Oral Tube size: 7.5 mm Number of attempts: 1 Airway Equipment and Method: Stylet and Oral airway Placement Confirmation: ETT inserted through vocal cords under direct vision, positive ETCO2 and breath sounds checked- equal and bilateral Secured at: 22 cm Tube secured with: Tape Dental Injury: Teeth and Oropharynx as per pre-operative assessment  Comments: Elective glidescope - ACDF

## 2021-03-17 NOTE — Progress Notes (Signed)
°  NEUROSURGERY PROGRESS NOTE   No issues overnight. Pt ready to proceed with surgery  EXAM:  BP (!) 178/43    Pulse 68    Temp 98.1 F (36.7 C) (Oral)    Resp 17    Ht 5\' 10"  (1.778 m)    Wt 68 kg    SpO2 98%    BMI 21.51 kg/m   Awake, alert, oriented  Speech fluent, appropriate  CN grossly intact    IMPRESSION:  71 y.o. male with severe symptomatic cervical stenosis and multiple underlying medical co morbidities making surgery relatively high risk. Nonetheless, patient has elected to proceed.  PLAN: - Proceed with ACDF C34, C45, C56  I have reviewed again the situation with the patient. All questions this am were answered and he provided written consent to proceed.    Consuella Lose, MD [facility]

## 2021-03-17 NOTE — Progress Notes (Signed)
Progress Note   Patient: Jose Garrett. HUD:[id number] DOB: Dec 13, 1950 DOA: 02/27/2021     17 DOS: the patient was seen and examined on 03/17/2021   Brief hospital course: 71 year old male with amyloidosis, chronic combined CHF, ESRD, CAD, peripheral vascular disease status post right BKA, severe AS status post TAVR presented to the hospital with increased weakness of bilateral arms, hands, fingers as well as wheelchair-bound for the past 3 weeks.  Patient has been unable to lift his arms above the shoulder which is completely new.  Imaging in the ED showed subacute spinal cord compression.  Neurosurgery and nephrology were consulted.  Patient take some time to come to a decision regarding surgery.  Underwent surgery on 03/17/2021.  Assessment and Plan * Spinal cord compression ([place])- (present on admission) -MRI on admission showed advanced degenerative changes resulting in severe spinal canal stenosis with cord compression from C3-4 through C5-6.   -Neurosurgery was consulted.  Patient underwent surgery on 03/17/2021.  Patient seems to be stable from neurological standpoint.  Chronic combined systolic and diastolic CHF (congestive heart failure) ([place])- (present on admission) -most recent 2D echo in August 2021 showed an EF of 45-50%, mild decreased LV function with global hypokinesis.  There was also grade 1 diastolic dysfunction.  RV was mildly reduced as well.   -Volume managed by dialysis.  ESRD (end stage renal disease) on dialysis [facility]) He is on a Monday Wednesday Friday schedule.  Nephrology is following.    Underwent spinal surgery today as discussed above.  To be dialyzed off schedule on 1/7.    S/P TAVR (transcatheter aortic valve replacement) Stable.  Coronary artery disease involving native coronary artery of native heart without angina pectoris- (present on admission) Most recent cath was done in May 2020 which showed severe single-vessel CAD with chronic total occlusion of the  RCA, with collaterals.  Mild calcific nonobstructive LCA disease.   Continue statin. Plavix held on 03/11/2021 in preparation for neurosurgery on 03/17/2021. Will need to determine from neurosurgery as to the resumption of Plavix.  PAD (peripheral artery disease) ([place])- (present on admission) Seen by vascular surgery as an outpatient, status post BKA on the right.  He has a femoropopliteal bypass graft on the left.  Essential hypertension, benign- (present on admission) Patient remains on amlodipine and hydralazine.  He is experiencing neck and back pain which could also be contributing to elevated blood pressure.  We will continue to monitor.  Thrombocytopenia ([place])- (present on admission) Etiology unclear.  Counts have been stable.  Continue to monitor intermittently.  Anemia of chronic disease- (present on admission) Hemoglobin has been stable.  Monitoring intermittently.     Subjective: Patient seen after he returned from the operating room.  Seems to be sedated but easily arousable.  Denies any significant pain currently.  Objective Vitals:   03/17/21 1408 03/17/21 1423 03/17/21 1500 03/17/21 1731  BP: (!) 172/57 (!) 158/62 (!) 154/51 (!) 167/54  Pulse: 86 85 85 82  Resp: (!) 21 (!) 24 16 16   Temp:  (!) 97 F (36.1 C) 98.1 F (36.7 C) 98.5 F (36.9 C)  TempSrc:   Axillary Oral  SpO2: 100% 98% 100% 100%  Weight:      Height:         General appearance: Somnolent but easily arousable Resp: Clear to auscultation bilaterally.  Normal effort Cardio: S1-S2 is normal regular.  No S3-S4.  No rubs murmurs or bruit GI: Abdomen is soft.  Nontender nondistended.  Bowel sounds are  present normal.  No masses organomegaly Extremities: No edema.   Neurologic:   No focal neurological deficits.     Data Reviewed:  My review of labs, imaging, notes and other tests shows no new significant findings.   Family Communication: No family at bedside.  Disposition: Status is:  Inpatient  Remains inpatient appropriate because: Spinal cord compression requiring neurosurgery          Author: Bonnielee Haff, MD 03/17/2021 5:35 PM  For on call review [email or web address].

## 2021-03-18 LAB — RENAL FUNCTION PANEL
Albumin: 3.5 g/dL (ref 3.5–5.0)
Anion gap: 16 — ABNORMAL HIGH (ref 5–15)
BUN: 51 mg/dL — ABNORMAL HIGH (ref 8–23)
CO2: 24 mmol/L (ref 22–32)
Calcium: 9.5 mg/dL (ref 8.9–10.3)
Chloride: 97 mmol/L — ABNORMAL LOW (ref 98–111)
Creatinine, Ser: 10.1 mg/dL — ABNORMAL HIGH (ref 0.61–1.24)
GFR, Estimated: 5 mL/min — ABNORMAL LOW (ref >60)
Glucose, Bld: 123 mg/dL — ABNORMAL HIGH (ref 70–99)
Phosphorus: 8.6 mg/dL — ABNORMAL HIGH (ref 2.5–4.6)
Potassium: 5.1 mmol/L (ref 3.5–5.1)
Sodium: 137 mmol/L (ref 135–145)

## 2021-03-18 MED ORDER — POLYETHYLENE GLYCOL 3350 17 G PO PACK
17.0000 g | PACK | Freq: Once | ORAL | Status: DC
Start: 2021-03-18 — End: 2021-03-18

## 2021-03-18 NOTE — Progress Notes (Signed)
Patient off floor for procedure 

## 2021-03-18 NOTE — Anesthesia Postprocedure Evaluation (Signed)
Anesthesia Post Note  Patient: Jose Garrett.  Procedure(s) Performed: Cervical three to cervial six anterior cervical decompression, discectomy and fusion (Neck)     Patient location during evaluation: PACU Anesthesia Type: General Level of consciousness: awake and alert Pain management: pain level controlled Vital Signs Assessment: post-procedure vital signs reviewed and stable Respiratory status: spontaneous breathing, nonlabored ventilation, respiratory function stable and patient connected to nasal cannula oxygen Cardiovascular status: blood pressure returned to baseline and stable Postop Assessment: no apparent nausea or vomiting Anesthetic complications: no   No notable events documented.  Last Vitals:  Vitals:   03/18/21 0042 03/18/21 0300  BP: (!) 167/55 (!) 173/56  Pulse: 71 70  Resp: 17 20  Temp: 36.4 C 36.5 C  SpO2: 100% 98%    Last Pain:  Vitals:   03/18/21 0640  TempSrc:   PainSc: 7                  [name]

## 2021-03-18 NOTE — Progress Notes (Signed)
Nephrology Progress Note:   Patient ID: Jose Bellew., Jose Garrett   DOB: 1950/04/20, 71 y.o.   MRN: [id number]  Subjective:  Last HD on 1/4 with 1 kg UF then HD yesterday deferred to facilitate surgery scheduling.   He hasn't really felt like eating - he is still sore post-op.  Review of systems:  Denies shortness of breath  Denies chest pain  No n/v Neck sore Constipation - no BM in several days - got suppository a few days ago he states; miralax is ordered daily   O:BP (!) 173/56 (BP Location: Left Arm)    Pulse 69    Temp (!) 97.5 F (36.4 C) (Oral)    Resp 16    Ht 5\' 10"  (1.778 m)    Wt 68 kg    SpO2 100%    BMI 21.51 kg/m   Intake/Output Summary (Last 24 hours) at 03/18/2021 0805 Last data filed at 03/18/2021 0500 Gross per 24 hour  Intake 688 ml  Output 100 ml  Net 588 ml     Intake/Output: I/O last 3 completed shifts: In: 688 [P.O.:120; I.V.:468; IV Piggyback:100] Out: 100 [Blood:100]  Intake/Output this shift:  No intake/output data recorded. Weight change:   Physical exam:  Gen: adult Jose Garrett in bed in NAD HEENT in a cervical collar CVS: S1S2 no rub Resp:CTA and normal work of breathing on room air Abd: soft, NT/nd Ext: rt bka, no edema Neuro: alert and oriented x 3; provides hx and follows commands Psych Normal mood and affect Dialysis access: R AVF with bruit and thrill   OP HD: MWF [facility]  4h  400/1.5  71.9kg  2/2 bath  Hep 2400  RUA AVF Mircera 75 mcg q4wks - last 02/15/21 Calcitriol 66mcg PO qHD-last 02/27/21      Assessment/Plan: Spinal Cord Compression-imaging shows advanced degenerative changes and spinal canal stenosis with cord compression. Neurosurgery consulted. S/p surgery on 1/6  ESRD - usually MWF schedule.  Next HD will be today 1/7 off schedule to accommodate surgery.  Renal panel daily  Hypertension/volume- on norvasc here. No vol excess on exam, under dry wt 3+kg (would not lower EDW to his nadir here as he wouldn't likely tolerate).  We started  hydralazine at 25 mg TID this admit.  Perioperative BP goals per neurosurgery and primary team  Anemia of CKD - continue ESA - aranesp 100 mcg every monday Secondary Hyperparathyroidism - hyperphos; improved on lanthanum. Resumed calcitriol   Disposition - per primary team.  Patient states that he was told possible rehab early next week    Studies/Results: DG Cervical Spine 1 View  Result Date: 03/17/2021 CLINICAL DATA:  ACDF EXAM: DG CERVICAL SPINE - 1 VIEW COMPARISON:  None. FINDINGS: A single intraoperative fluoroscopic spot film was submitted for review. Images demonstrate a lateral view of the cervical spine with visualization to the level of the C6 vertebral body. There is anterior cervical fusion hardware spanning C3-C6. Intervertebral disc spacers are present at these levels. No abnormal alignment of the spine. Endotracheal tube and surgical sponges are within the field of view. IMPRESSION: Intraoperative fluoroscopic film during ACDF spanning C3-C6 as described. Refer to operative report for full details. Electronically Signed   By: Albin Felling M.D.   On: 03/17/2021 13:02   DG C-Arm 1-60 Min-No Report  Result Date: 03/17/2021 Fluoroscopy was utilized by the requesting physician.  No radiographic interpretation.   DG C-Arm 1-60 Min-No Report  Result Date: 03/17/2021 Fluoroscopy was utilized by the requesting  physician.  No radiographic interpretation.   DG C-Arm 1-60 Min-No Report  Result Date: 03/17/2021 Fluoroscopy was utilized by the requesting physician.  No radiographic interpretation.    (feeding supplement) PROSource Plus  30 mL Oral TID BM   amiodarone  200 mg Oral Daily   amLODipine  10 mg Oral Daily   atorvastatin  80 mg Oral Q supper   calcitRIOL  1 mcg Oral Q M,W,F-HD   Chlorhexidine Gluconate Cloth  6 each Topical Q0600   darbepoetin (ARANESP) injection - DIALYSIS  100 mcg Intravenous Q Mon-HD   docusate sodium  100 mg Oral BID   heparin  5,000 Units Subcutaneous  Q12H   hydrALAZINE  25 mg Oral Q8H   lanthanum  2,000 mg Oral TID WC   pantoprazole  40 mg Oral Daily   polyethylene glycol  17 g Oral Daily   senna-docusate  2 tablet Oral BID   sodium chloride flush  3 mL Intravenous Q12H   vitamin B-12  1,000 mcg Oral QPM     Claudia Desanctis, MD 03/18/2021 8:27 AM

## 2021-03-18 NOTE — Progress Notes (Signed)
Subjective: The patient is alert and pleasant.  He is in no apparent distress.  He complains of dysphagia.  He is getting dialyzed.  Objective: Vital signs in last 24 hours: Temp:  [97 F (36.1 C)-98.5 F (36.9 C)] 97.9 F (36.6 C) (01/07 0855) Pulse Rate:  [69-87] 73 (01/07 0930) Resp:  [13-24] 14 (01/07 0930) BP: (154-176)/(50-70) 156/61 (01/07 0930) SpO2:  [96 %-100 %] 100 % (01/07 0915) Estimated body mass index is 21.51 kg/m as calculated from the following:   Height as of this encounter: 5\' 10"  (1.778 m).   Weight as of this encounter: 68 kg.   Intake/Output from previous day: 01/06 0701 - 01/07 0700 In: 688 [P.O.:120; I.V.:468; IV Piggyback:100] Out: 100 [Blood:100] Intake/Output this shift: No intake/output data recorded.  Physical exam the patient is alert and pleasant.  His dressing is clean and dry.  There is no hematoma or shift.  He is moving all 4 extremities.  Lab Results: Recent Labs    03/17/21 0033  WBC 4.9  HGB 10.8*  HCT 33.7*  PLT 118*   BMET Recent Labs    03/17/21 0033 03/18/21 0230  NA 135 137  K 3.8 5.1  CL 96* 97*  CO2 26 24  GLUCOSE 92 123*  BUN 36* 51*  CREATININE 8.20* 10.10*  CALCIUM 9.2 9.5    Studies/Results: DG Cervical Spine 1 View  Result Date: 03/17/2021 CLINICAL DATA:  ACDF EXAM: DG CERVICAL SPINE - 1 VIEW COMPARISON:  None. FINDINGS: A single intraoperative fluoroscopic spot film was submitted for review. Images demonstrate a lateral view of the cervical spine with visualization to the level of the C6 vertebral body. There is anterior cervical fusion hardware spanning C3-C6. Intervertebral disc spacers are present at these levels. No abnormal alignment of the spine. Endotracheal tube and surgical sponges are within the field of view. IMPRESSION: Intraoperative fluoroscopic film during ACDF spanning C3-C6 as described. Refer to operative report for full details. Electronically Signed   By: Albin Felling M.D.   On: 03/17/2021  13:02   DG C-Arm 1-60 Min-No Report  Result Date: 03/17/2021 Fluoroscopy was utilized by the requesting physician.  No radiographic interpretation.   DG C-Arm 1-60 Min-No Report  Result Date: 03/17/2021 Fluoroscopy was utilized by the requesting physician.  No radiographic interpretation.   DG C-Arm 1-60 Min-No Report  Result Date: 03/17/2021 Fluoroscopy was utilized by the requesting physician.  No radiographic interpretation.    Assessment/Plan: Postop day 1: The patient is doing well.  I have answered all his questions.  LOS: 18 days     Ophelia Charter 03/18/2021, 9:36 AM     Patient ID: Jose Garrett., male   DOB: 1950/08/05, 71 y.o.   MRN: [id number]

## 2021-03-18 NOTE — Progress Notes (Signed)
Progress Note   Patient: Jose Garrett. PYK:[id number] DOB: 1950/08/02 DOA: 02/27/2021     18 DOS: the patient was seen and examined on 03/18/2021   Brief hospital course: 71 year old male with amyloidosis, chronic combined CHF, ESRD, CAD, peripheral vascular disease status post right BKA, severe AS status post TAVR presented to the hospital with increased weakness of bilateral arms, hands, fingers as well as wheelchair-bound for the past 3 weeks.  Patient has been unable to lift his arms above the shoulder which is completely new.  Imaging in the ED showed subacute spinal cord compression.  Neurosurgery and nephrology were consulted.  Patient took some time to come to a decision regarding surgery.  Underwent surgery on 03/17/2021.  Assessment and Plan * Spinal cord compression ([place])- (present on admission) -MRI on admission showed advanced degenerative changes resulting in severe spinal canal stenosis with cord compression from C3-4 through C5-6.   -Neurosurgery was consulted.  Patient underwent surgery on 03/17/2021.   Reports some improvement in the strength of his right upper extremity and lower extremity.  Complains of pain in the neck area.  ESRD (end stage renal disease) on dialysis [facility]) He is on a Monday Wednesday Friday schedule.  Nephrology is following.    To be dialyzed off schedule today since he underwent surgery yesterday.   Essential hypertension, benign- (present on admission) Patient remains on amlodipine and hydralazine.  He is experiencing neck and back pain which could also be contributing to elevated blood pressure.  Blood pressure noted to be elevated during the night.  Likely from pain issues.  Would not aggressively treat since he will be dialyzed today.  Thrombocytopenia ([place])- (present on admission) Etiology unclear.  Counts have been stable.  Continue to monitor intermittently.  Anemia of chronic disease- (present on admission) Hemoglobin has been stable.   Monitoring intermittently.  Coronary artery disease involving native coronary artery of native heart without angina pectoris- (present on admission) Most recent cath was done in May 2020 which showed severe single-vessel CAD with chronic total occlusion of the RCA, with collaterals.  Mild calcific nonobstructive LCA disease.   Continue statin. Plavix held on 03/11/2021 in preparation for neurosurgery on 03/17/2021. Will need to determine from neurosurgery as to the timing of resumption of Plavix.  PAD (peripheral artery disease) ([place])- (present on admission) Seen by vascular surgery as an outpatient, status post BKA on the right.  He has a femoropopliteal bypass graft on the left.  Chronic combined systolic and diastolic CHF (congestive heart failure) ([place])- (present on admission) Most recent 2D echo in August 2021 showed an EF of 45-50%, mild decreased LV function with global hypokinesis.  There was also grade 1 diastolic dysfunction.  RV was mildly reduced as well.  Volume managed by dialysis.  S/P TAVR (transcatheter aortic valve replacement) Stable.     Subjective: Patient complains of pain in the neck area.  He was asked to discuss this with neurosurgeon.  Denies any chest pain shortness of breath.  Reports some improvement in strength in the right upper and lower extremities.  Objective Vitals:   03/18/21 0855 03/18/21 0915 03/18/21 0930 03/18/21 1000  BP: (!) 164/69 (!) 166/70 (!) 156/61 138/69  Pulse: 70 71 73 74  Resp: 13 13 14 14   Temp: 97.9 F (36.6 C)     TempSrc:      SpO2: 100% 100%    Weight: 71.6 kg     Height:        General appearance: Awake  alert.  In no distress Neck collar noted. Resp: Clear to auscultation bilaterally.  Normal effort Cardio: S1-S2 is normal regular.  No S3-S4.  No rubs murmurs or bruit GI: Abdomen is soft.  Nontender nondistended.  Bowel sounds are present normal.  No masses organomegaly Extremities: No edema.   Neurologic: Alert and  oriented x3.  Improved strength of the right upper and lower extremity noted.   Data Reviewed:  Labs reviewed.  Noted to be stable.  Family Communication: Discussed with patient.  No family at bedside  Disposition: Status is: Inpatient  Remains inpatient appropriate because: Spinal surgery          Author: Bonnielee Haff, MD 03/18/2021 10:10 AM  For on call review [email or web address].

## 2021-03-18 NOTE — Progress Notes (Signed)
PT Cancellation Note  Patient Details Name: Jose Garrett. MRN: [id number] DOB: 01-22-1951   Cancelled Treatment:    Reason Eval/Treat Not Completed: Patient at procedure or test/unavailable. Pt just transported to HD. PT to re-attempt as time allows.   [name] 03/18/2021, 8:34 AM  [name], PT  Office # [phone] Pager [phone]

## 2021-03-18 NOTE — Procedures (Signed)
Seen and examined on dialysis.  Procedure supervised.  Blood pressure 127/64 and HR 76.  R AVF in use.  Tolerating goal.    Claudia Desanctis, MD 03/18/2021  10:32 AM

## 2021-03-18 NOTE — Plan of Care (Signed)
°  Problem: Education: Goal: Knowledge of General Education information will improve Description: Including pain rating scale, medication(s)/side effects and non-pharmacologic comfort measures Outcome: Progressing   Problem: Health Behavior/Discharge Planning: Goal: Ability to manage health-related needs will improve Outcome: Progressing   Problem: Clinical Measurements: Goal: Ability to maintain clinical measurements within normal limits will improve Outcome: Progressing Goal: Will remain free from infection Outcome: Progressing Goal: Diagnostic test results will improve Outcome: Progressing Goal: Respiratory complications will improve Outcome: Progressing Goal: Cardiovascular complication will be avoided Outcome: Progressing   Problem: Activity: Goal: Risk for activity intolerance will decrease Outcome: Progressing   Problem: Nutrition: Goal: Adequate nutrition will be maintained Outcome: Progressing   Problem: Coping: Goal: Level of anxiety will decrease Outcome: Progressing   Problem: Elimination: Goal: Will not experience complications related to bowel motility Outcome: Progressing Goal: Will not experience complications related to urinary retention Outcome: Progressing   Problem: Pain Managment: Goal: General experience of comfort will improve Outcome: Progressing   Problem: Safety: Goal: Ability to remain free from injury will improve Outcome: Progressing   Problem: Skin Integrity: Goal: Risk for impaired skin integrity will decrease Outcome: Progressing   Problem: Activity: Goal: Ability to perform activities at highest level will improve Outcome: Progressing Goal: Muscle strength will improve Outcome: Progressing   Problem: Bowel/Gastric: Goal: Ability to demonstrate the techniques of an individualized bowel program will improve Outcome: Progressing   Problem: Education: Goal: Knowledge of disease or condition will improve Outcome:  Progressing Goal: Knowledge of the prescribed therapeutic regimen will improve Outcome: Progressing   Problem: Coping: Goal: Ability to identify and develop effective coping behavior will improve Outcome: Progressing Goal: Ability to verbalize feelings will improve Outcome: Progressing   Problem: Self-Care: Goal: Ability to participate in self-care as condition permits will improve Outcome: Progressing   Problem: Skin Integrity: Goal: Risk for impaired skin integrity will decrease Outcome: Progressing   Problem: Urinary Elimination: Goal: Ability to achieve a regular elimination pattern will improve Outcome: Progressing   Problem: Coping: Goal: Ability to verbalize feelings will improve Outcome: Progressing   Problem: Urinary Elimination: Goal: Ability to achieve a regular elimination pattern will improve Outcome: Progressing   Problem: Skin Integrity: Goal: Risk for impaired skin integrity will decrease Outcome: Progressing

## 2021-03-19 LAB — CBC
HCT: 34.7 % — ABNORMAL LOW (ref 39.0–52.0)
Hemoglobin: 11.4 g/dL — ABNORMAL LOW (ref 13.0–17.0)
MCH: 32 pg (ref 26.0–34.0)
MCHC: 32.9 g/dL (ref 30.0–36.0)
MCV: 97.5 fL (ref 80.0–100.0)
Platelets: 129 10*3/uL — ABNORMAL LOW (ref 150–400)
RBC: 3.56 MIL/uL — ABNORMAL LOW (ref 4.22–5.81)
RDW: 15.9 % — ABNORMAL HIGH (ref 11.5–15.5)
WBC: 7.4 10*3/uL (ref 4.0–10.5)
nRBC: 0 % (ref 0.0–0.2)

## 2021-03-19 LAB — RENAL FUNCTION PANEL
Albumin: 3.4 g/dL — ABNORMAL LOW (ref 3.5–5.0)
Anion gap: 13 (ref 5–15)
BUN: 30 mg/dL — ABNORMAL HIGH (ref 8–23)
CO2: 26 mmol/L (ref 22–32)
Calcium: 9.7 mg/dL (ref 8.9–10.3)
Chloride: 96 mmol/L — ABNORMAL LOW (ref 98–111)
Creatinine, Ser: 6.85 mg/dL — ABNORMAL HIGH (ref 0.61–1.24)
GFR, Estimated: 8 mL/min — ABNORMAL LOW (ref >60)
Glucose, Bld: 98 mg/dL (ref 70–99)
Phosphorus: 5.6 mg/dL — ABNORMAL HIGH (ref 2.5–4.6)
Potassium: 4.2 mmol/L (ref 3.5–5.1)
Sodium: 135 mmol/L (ref 135–145)

## 2021-03-19 MED ORDER — NEPRO/CARBSTEADY PO LIQD
237.0000 mL | Freq: Three times a day (TID) | ORAL | Status: DC
  Administered 2021-03-19 – 2021-03-24 (×10): 237 mL via ORAL

## 2021-03-19 MED ORDER — CHLORHEXIDINE GLUCONATE CLOTH 2 % EX PADS
6.0000 | MEDICATED_PAD | Freq: Every day | CUTANEOUS | Status: DC
  Administered 2021-03-20 – 2021-03-24 (×5): 6 via TOPICAL

## 2021-03-19 MED ORDER — ONDANSETRON HCL 4 MG/2ML IJ SOLN
4.0000 mg | Freq: Four times a day (QID) | INTRAMUSCULAR | Status: DC | PRN
  Administered 2021-03-19 – 2021-03-23 (×2): 4 mg via INTRAVENOUS
  Filled 2021-03-19 (×2): qty 2

## 2021-03-19 NOTE — Progress Notes (Addendum)
Physical Therapy Re-Evaluation and Treatment Patient Details Name: Jose Garrett. MRN: [id number] DOB: 27-Aug-1950 Today's Date: 03/19/2021   History of Present Illness 71 y.o. male admitted on 02/27/21 due to increased weakness of BUE weakness and has become WC bound in the past 3 weeks due to weakness. MRI shows subacute c-spine cord compression. He underwent ACDF 1/6.  PMH significant for amyloidosis, chronic combined CHF, ESRD, CAD, peripheral vascular disease status post right BKA, severe AS status post TAVR.    PT Comments    Pt seen for re-evaluation following ACDF. He required mod assist bed mobility, +2 mod assist sit to stand with RW, and +2 mod assist step pivot transfer with RW. No knee buckling noted. He reports he is seeing improved active finger extension on R, but no other notable differences at this time. Reports 7/10 throat pain due to intubation during sx. Pt in recliner at end of session. Goals updated as needed. POC remains appropriate.    Recommendations for follow up therapy are one component of a multi-disciplinary discharge planning process, led by the attending physician.  Recommendations may be updated based on patient status, additional functional criteria and insurance authorization.  Follow Up Recommendations  Skilled nursing-short term rehab (<3 hours/day)     Assistance Recommended at Discharge Frequent or constant Supervision/Assistance  Patient can return home with the following Two people to help with walking and/or transfers;A lot of help with bathing/dressing/bathroom;Assistance with cooking/housework;Assist for transportation;Help with stairs or ramp for entrance   Equipment Recommendations  None recommended by PT    Recommendations for Other Services       Precautions / Restrictions Precautions Precautions: Fall;Cervical Precaution Comments: educated on cervical precautions. Required Braces or Orthoses: Cervical Brace Cervical Brace: Hard  collar;Other (comment) (when OOB, may remove to shower)     Mobility  Bed Mobility Overal bed mobility: Needs Assistance Bed Mobility: Rolling;Sidelying to Sit Rolling: Supervision Sidelying to sit: Mod assist       General bed mobility comments: +rail, increased time    Transfers Overall transfer level: Needs assistance Equipment used: Rolling walker (2 wheels) Transfers: Sit to/from Stand Sit to Stand: Mod assist;From elevated surface;+2 physical assistance     Step pivot transfers: Mod assist;+2 physical assistance     General transfer comment: assist to power up and stabilize balance. Pt pulling up both hands on RW. Assist needed to stabilize grip. Pivot steps with RW toward L, bed to recliner. Pt lowered down into recliner without reaching back to assist with BUE. Increased time. RLE prosthesis donned for mobility.    Ambulation/Gait                   Stairs             Wheelchair Mobility    Modified Rankin (Stroke Patients Only)       Balance Overall balance assessment: Needs assistance Sitting-balance support: Feet supported;No upper extremity supported Sitting balance-Leahy Scale: Good     Standing balance support: Bilateral upper extremity supported;Reliant on assistive device for balance;During functional activity Standing balance-Leahy Scale: Poor                              Cognition Arousal/Alertness: Awake/alert Behavior During Therapy: WFL for tasks assessed/performed Overall Cognitive Status: Within Functional Limits for tasks assessed  Exercises      General Comments General comments (skin integrity, edema, etc.): VSS on RA      Pertinent Vitals/Pain Pain Assessment: No/denies pain Pain Score: 7  Pain Location: neck when he swallows Pain Descriptors / Indicators: Grimacing;Guarding;Discomfort Pain Intervention(s): Monitored during  session;Repositioned    Home Living                          Prior Function            PT Goals (current goals can now be found in the care plan section) Acute Rehab PT Goals Patient Stated Goal: to improve hand function and walking PT Goal Formulation: With patient Time For Goal Achievement: 04/02/21 Potential to Achieve Goals: Fair Progress towards PT goals: Progressing toward goals    Frequency    Min 2X/week      PT Plan Current plan remains appropriate    Co-evaluation              AM-PAC PT "6 Clicks" Mobility   Outcome Measure  Help needed turning from your back to your side while in a flat bed without using bedrails?: A Little Help needed moving from lying on your back to sitting on the side of a flat bed without using bedrails?: A Lot Help needed moving to and from a bed to a chair (including a wheelchair)?: Total Help needed standing up from a chair using your arms (e.g., wheelchair or bedside chair)?: Total Help needed to walk in hospital room?: Total Help needed climbing 3-5 steps with a railing? : Total 6 Click Score: 9    End of Session Equipment Utilized During Treatment: Gait belt Activity Tolerance: Patient tolerated treatment well Patient left: in chair;with call bell/phone within reach;with chair alarm set Nurse Communication: Mobility status PT Visit Diagnosis: Other abnormalities of gait and mobility (R26.89);Muscle weakness (generalized) (M62.81)     Time: 6629-4765 PT Time Calculation (min) (ACUTE ONLY): 21 min  Charges:                        [name], PT  Office # [phone] Pager [phone]    [name] 03/19/2021, 11:09 AM

## 2021-03-19 NOTE — Progress Notes (Signed)
° °  Providing Compassionate, Quality Care - Together   Subjective: Patient reports still with some weakness. Feels that his dexterity is improving.  Objective: Vital signs in last 24 hours: Temp:  [97.7 F (36.5 C)-98.7 F (37.1 C)] 98.2 F (36.8 C) (01/08 0724) Pulse Rate:  [71-84] 74 (01/08 0724) Resp:  [13-27] 18 (01/08 0724) BP: (111-177)/(46-75) 160/52 (01/08 0724) SpO2:  [96 %-100 %] 100 % (01/08 0724) Weight:  [71.2 kg] 71.2 kg (01/07 1215)  Intake/Output from previous day: 01/07 0701 - 01/08 0700 In: -  Out: 629  Intake/Output this shift: No intake/output data recorded.  Alert and oriented x 4 PERRLA CN II-XII grossly intact Right BKA MAE Incision is covered with Honeycomb dressing; Dressing is clean, dry, and intact Hard cervical collar in place  Lab Results: Recent Labs    03/17/21 0033 03/19/21 0144  WBC 4.9 7.4  HGB 10.8* 11.4*  HCT 33.7* 34.7*  PLT 118* 129*   BMET Recent Labs    03/18/21 0230 03/19/21 0144  NA 137 135  K 5.1 4.2  CL 97* 96*  CO2 24 26  GLUCOSE 123* 98  BUN 51* 30*  CREATININE 10.10* 6.85*  CALCIUM 9.5 9.7    Studies/Results: DG Cervical Spine 1 View  Result Date: 03/17/2021 CLINICAL DATA:  ACDF EXAM: DG CERVICAL SPINE - 1 VIEW COMPARISON:  None. FINDINGS: A single intraoperative fluoroscopic spot film was submitted for review. Images demonstrate a lateral view of the cervical spine with visualization to the level of the C6 vertebral body. There is anterior cervical fusion hardware spanning C3-C6. Intervertebral disc spacers are present at these levels. No abnormal alignment of the spine. Endotracheal tube and surgical sponges are within the field of view. IMPRESSION: Intraoperative fluoroscopic film during ACDF spanning C3-C6 as described. Refer to operative report for full details. Electronically Signed   By: Albin Felling M.D.   On: 03/17/2021 13:02   DG C-Arm 1-60 Min-No Report  Result Date: 03/17/2021 Fluoroscopy was  utilized by the requesting physician.  No radiographic interpretation.   DG C-Arm 1-60 Min-No Report  Result Date: 03/17/2021 Fluoroscopy was utilized by the requesting physician.  No radiographic interpretation.   DG C-Arm 1-60 Min-No Report  Result Date: 03/17/2021 Fluoroscopy was utilized by the requesting physician.  No radiographic interpretation.    Assessment/Plan: Patient underwent a C3-4, C4-5, C5-6 ACDF by Dr. Kathyrn Sheriff on 03/17/2021.   LOS: 19 days   -Continue to work with therapies. Recommendation is for SNF at discharge.   Viona Gilmore, DNP, AGNP-C Nurse Practitioner  [facility] [address], [place], [place] [address] P: [phone]     F: [phone]  03/19/2021, 9:13 AM

## 2021-03-19 NOTE — Progress Notes (Signed)
Nephrology Progress Note:   Patient ID: Jose Inge., male   DOB: 13-Aug-1950, 71 y.o.   MRN: [id number]  Subjective:  Last HD on 1/7 off schedule with 0.7 kg UF.  He was uncomfortable post-op and asked to come off of HD early . He thinks plans is still for rehab of some type this week.  Hands feel a little stronger but still numb  Review of systems: Denies shortness of breath  Denies chest pain  Nausea with vomiting yesterday Neck sore  O:BP (!) 160/52 (BP Location: Left Arm)    Pulse 74    Temp 98.2 F (36.8 C)    Resp 18    Ht 5\' 10"  (1.778 m)    Wt 71.2 kg    SpO2 100%    BMI 22.52 kg/m   Intake/Output Summary (Last 24 hours) at 03/19/2021 0752 Last data filed at 03/18/2021 1215 Gross per 24 hour  Intake --  Output 629 ml  Net -629 ml     Intake/Output: I/O last 3 completed shifts: In: 120 [P.O.:120] Out: 629 [Other:629]  Intake/Output this shift:  No intake/output data recorded. Weight change: 3.6 kg  Physical exam:  Gen: adult male in bed in NAD HEENT in a cervical collar CVS: S1S2 no rub Resp:CTA and normal work of breathing on room air Abd: soft, NT/nd Ext: rt bka, no edema Neuro: alert and oriented x 3; provides hx and follows commands Psych Normal mood and affect Dialysis access: R AVF with bruit and thrill   OP HD: MWF [facility]  4h  400/1.5  71.9kg  2/2 bath  Hep 2400  RUA AVF Mircera 75 mcg q4wks - last 02/15/21 Calcitriol 27mcg PO qHD-last 02/27/21      Assessment/Plan: Spinal Cord Compression-imaging shows advanced degenerative changes and spinal canal stenosis with cord compression. Neurosurgery consulted. S/p surgery on 1/6  ESRD - usually MWF schedule then HD off schedule on 1/7 to accommodate surgery.  Now will be back on MWF schedule.  Renal panel daily  Hypertension/volume- No vol excess on exam, under dry wt.  (would not lower EDW to his nadir here as he wouldn't likely tolerate - consider 68 kg?).  Note now with neck brace - unsure of weight but  seems like just hard foam per pt/light.  We started hydralazine at 25 mg TID this admit as HTN remained despite being several kg below EDW.  Anemia of CKD - pause ESA - had been on aranesp 100 mcg every Monday and last Hb 11.4. anticipate will need to resume lower dose here Secondary Hyperparathyroidism - hyperphos; improved on lanthanum. on calcitriol   Disposition - per primary team.  Patient states that he was told possible rehab early this week    Studies/Results: DG Cervical Spine 1 View  Result Date: 03/17/2021 CLINICAL DATA:  ACDF EXAM: DG CERVICAL SPINE - 1 VIEW COMPARISON:  None. FINDINGS: A single intraoperative fluoroscopic spot film was submitted for review. Images demonstrate a lateral view of the cervical spine with visualization to the level of the C6 vertebral body. There is anterior cervical fusion hardware spanning C3-C6. Intervertebral disc spacers are present at these levels. No abnormal alignment of the spine. Endotracheal tube and surgical sponges are within the field of view. IMPRESSION: Intraoperative fluoroscopic film during ACDF spanning C3-C6 as described. Refer to operative report for full details. Electronically Signed   By: Albin Felling M.D.   On: 03/17/2021 13:02   DG C-Arm 1-60 Min-No Report  Result Date:  03/17/2021 Fluoroscopy was utilized by the requesting physician.  No radiographic interpretation.   DG C-Arm 1-60 Min-No Report  Result Date: 03/17/2021 Fluoroscopy was utilized by the requesting physician.  No radiographic interpretation.   DG C-Arm 1-60 Min-No Report  Result Date: 03/17/2021 Fluoroscopy was utilized by the requesting physician.  No radiographic interpretation.    (feeding supplement) PROSource Plus  30 mL Oral TID BM   amiodarone  200 mg Oral Daily   amLODipine  10 mg Oral Daily   atorvastatin  80 mg Oral Q supper   calcitRIOL  1 mcg Oral Q M,W,F-HD   Chlorhexidine Gluconate Cloth  6 each Topical Q0600   darbepoetin (ARANESP) injection -  DIALYSIS  100 mcg Intravenous Q Mon-HD   docusate sodium  100 mg Oral BID   heparin  5,000 Units Subcutaneous Q12H   hydrALAZINE  25 mg Oral Q8H   lanthanum  2,000 mg Oral TID WC   pantoprazole  40 mg Oral Daily   polyethylene glycol  17 g Oral Daily   senna-docusate  2 tablet Oral BID   sodium chloride flush  3 mL Intravenous Q12H   vitamin B-12  1,000 mcg Oral QPM     Claudia Desanctis, MD 03/19/2021 8:06 AM

## 2021-03-19 NOTE — TOC Progression Note (Signed)
Transition of Care (TOC) - Progression Note    Patient Details  Name: Jose Garrett. MRN: [id number] Date of Birth: 03-30-1950  Transition of Care [facility]) CM/SW [place], [place] Phone Number: 03/19/2021, 2:11 PM  Clinical Narrative:    CSW met with pt at bedside to provide bed offers for rehab. Pt was lethargic and stated he would review them after he rests. TOC will follow back up for bed choice.   Expected Discharge Plan: Skilled Nursing Facility Barriers to Discharge: Continued Medical Work up, Ship broker  Expected Discharge Plan and Services Expected Discharge Plan: [place] Choice: [place] arrangements for the past 2 months: Single Family Home                                       Social Determinants of Health (SDOH) Interventions    Readmission Risk Interventions Readmission Risk Prevention Plan 04/15/2019 10/23/2018 08/20/2018  Transportation Screening Complete Complete -  PCP or Specialist Appt within 3-5 Days - Complete -  HRI or [place] - Complete -  Social Work Consult for [place] Planning/Counseling - Complete -  Palliative Care Screening - Not Applicable -  Medication Review Press photographer) Complete Complete -  PCP or Specialist appointment within 3-5 days of discharge Complete - Complete  HRI or Home Care Consult Complete - Complete  SW Recovery Care/Counseling Consult Complete - Complete  Palliative Care Screening Not Applicable - Not [place] Patient Refused - Not Applicable  Some recent data might be hidden

## 2021-03-19 NOTE — Progress Notes (Signed)
Progress Note   Patient: Jose Garrett. NGE:[id number] DOB: Dec 17, 1950 DOA: 02/27/2021     19 DOS: the patient was seen and examined on 03/19/2021   Brief hospital course: 71 year old male with amyloidosis, chronic combined CHF, ESRD, CAD, peripheral vascular disease status post right BKA, severe AS status post TAVR presented to the hospital with increased weakness of bilateral arms, hands, fingers as well as wheelchair-bound for the past 3 weeks.  Patient has been unable to lift his arms above the shoulder which is completely new.  Imaging in the ED showed subacute spinal cord compression.  Neurosurgery and nephrology were consulted.  Patient took some time to come to a decision regarding surgery.  Underwent surgery on 03/17/2021. Experiencing some pain in the neck area from recent surgery.  Assessment and Plan * Spinal cord compression ([place])- (present on admission) -MRI on admission showed advanced degenerative changes resulting in severe spinal canal stenosis with cord compression from C3-4 through C5-6.   -Neurosurgery was consulted.  Patient underwent surgery on 03/17/2021.   Neurologically seems to be stable.  Experiencing mild dysphagia.  Will change to soft diet.  ESRD (end stage renal disease) on dialysis [facility]) He is on a Monday Wednesday Friday schedule.  Nephrology is following.    He was dialyzed off schedule on 1/9.  Will likely return to usual schedule starting tomorrow.  Essential hypertension, benign- (present on admission) Patient remains on amlodipine and hydralazine.   Elevated blood pressure.  Noted over the last 48 hours.  Likely due to acute pain issues.  Continue to monitor for now.  Thrombocytopenia ([place])- (present on admission) Etiology unclear.  Counts have been stable.  Continue to monitor intermittently.  Anemia of chronic disease- (present on admission) Hemoglobin has been stable.  Monitoring intermittently.  Coronary artery disease involving native coronary  artery of native heart without angina pectoris- (present on admission) Most recent cath was done in May 2020 which showed severe single-vessel CAD with chronic total occlusion of the RCA, with collaterals.  Mild calcific nonobstructive LCA disease.   Continue statin. Plavix held on 03/11/2021 in preparation for neurosurgery on 03/17/2021. Discussed with Dr. Arnoldo Morale regarding resumption of Plavix.  He mentioned that it would be okay to resume at after Monday.  We will plan to resume on 1/10.  Hemoglobin noted to be stable.  PAD (peripheral artery disease) ([place])- (present on admission) Seen by vascular surgery as an outpatient, status post BKA on the right.  He has a femoropopliteal bypass graft on the left.  Chronic combined systolic and diastolic CHF (congestive heart failure) ([place])- (present on admission) Most recent 2D echo in August 2021 showed an EF of 45-50%, mild decreased LV function with global hypokinesis.  There was also grade 1 diastolic dysfunction.  RV was mildly reduced as well.  Volume managed by dialysis.  S/P TAVR (transcatheter aortic valve replacement) Stable.     Subjective: Complains of some pain in the neck area.  Also experiencing some difficulty swallowing food.  No shortness of breath chest pain.  No nausea vomiting.    Objective Vitals:   03/18/21 2339 03/19/21 0400 03/19/21 0504 03/19/21 0724  BP: (!) 149/75 (!) 177/62 (!) 169/51 (!) 160/52  Pulse: 74 77 79 74  Resp: 16 18 20 18   Temp: 98.2 F (36.8 C) 98.4 F (36.9 C)  98.2 F (36.8 C)  TempSrc: Oral Oral    SpO2: 97% 100%  100%  Weight:      Height:  General appearance: Awake alert.  In no distress Noted to be in neck collar.  No swelling noted over visible area of the neck Resp: Clear to auscultation bilaterally.  Normal effort Cardio: S1-S2 is normal regular.  No S3-S4.  No rubs murmurs or bruit GI: Abdomen is soft.  Nontender nondistended.  Bowel sounds are present normal.  No masses  organomegaly Extremities: No edema.     Data Reviewed:  Labs reviewed.  Noted to be stable.  Labs reviewed.  Stable for the most part.  Family Communication: Discussed with patient.  No family at bedside  Disposition: Status is: Inpatient  Remains inpatient appropriate because: Spinal surgery          Author: Bonnielee Haff, MD 03/19/2021 10:37 AM  For on call review [email or web address].

## 2021-03-20 ENCOUNTER — Encounter (HOSPITAL_COMMUNITY): Payer: Self-pay | Admitting: Neurosurgery

## 2021-03-20 LAB — RENAL FUNCTION PANEL
Albumin: 3.5 g/dL (ref 3.5–5.0)
Anion gap: 16 — ABNORMAL HIGH (ref 5–15)
BUN: 49 mg/dL — ABNORMAL HIGH (ref 8–23)
CO2: 24 mmol/L (ref 22–32)
Calcium: 9.8 mg/dL (ref 8.9–10.3)
Chloride: 94 mmol/L — ABNORMAL LOW (ref 98–111)
Creatinine, Ser: 8.72 mg/dL — ABNORMAL HIGH (ref 0.61–1.24)
GFR, Estimated: 6 mL/min — ABNORMAL LOW (ref >60)
Glucose, Bld: 108 mg/dL — ABNORMAL HIGH (ref 70–99)
Phosphorus: 6.5 mg/dL — ABNORMAL HIGH (ref 2.5–4.6)
Potassium: 4.4 mmol/L (ref 3.5–5.1)
Sodium: 134 mmol/L — ABNORMAL LOW (ref 135–145)

## 2021-03-20 MED ORDER — ALUM & MAG HYDROXIDE-SIMETH 200-200-20 MG/5ML PO SUSP
30.0000 mL | Freq: Once | ORAL | Status: AC
  Administered 2021-03-20: 30 mL via ORAL
  Filled 2021-03-20: qty 30

## 2021-03-20 NOTE — Progress Notes (Signed)
Nephrology Progress Note:   Patient ID: Jose Garrett., male   DOB: 06/14/1950, 71 y.o.   MRN: [id number]  Subjective: had HD this am, s/o early. Having lots of pain. Seen in room.   Physical exam:  Gen: adult male in bed in NAD HEENT in a cervical collar CVS: S1S2 no rub Resp:CTA and normal work of breathing on room air Abd: soft, NT/nd Ext: R bka, no edema Neuro: alert and oriented x 3; provides hx and follows commands Psych Normal mood and affect Dialysis access: R AVF +t/b  OP HD: MWF [facility]  4h  400/1.5  71.9kg  2/2 bath  Hep 2400  RUA AVF Mircera 75 mcg q4wks - last 02/15/21 Calcitriol 45mcg PO qHD-last 02/27/21      Assessment/Plan: SP C3- C6 ACDF - by Dr Kathyrn Sheriff on 03/17/21 for severe cervical spinal stenosis w/ bilat UE numbness and weakness  ESRD - MWF HD. HD today.  HTN /volume- No vol excess on exam, 3kg under, losing body wt here. Usually does not have volume issues. We started hydralazine at 25 mg tid here for HTN.  Anemia of CKD - pause ESA - had been on aranesp 100 mcg every Monday and last Hb 11.4. anticipate will need to resume lower dose here Secondary Hyperparathyroidism - hyperphos; improved on lanthanum. on calcitriol  Dispo - per primary team  [name], MD 03/20/2021, 2:16 PM     Studies/Results: No results found.  (feeding supplement) PROSource Plus  30 mL Oral TID BM   alum & mag hydroxide-simeth  30 mL Oral Once   amiodarone  200 mg Oral Daily   amLODipine  10 mg Oral Daily   atorvastatin  80 mg Oral Q supper   calcitRIOL  1 mcg Oral Q M,W,F-HD   Chlorhexidine Gluconate Cloth  6 each Topical Q0600   Chlorhexidine Gluconate Cloth  6 each Topical Q0600   docusate sodium  100 mg Oral BID   feeding supplement (NEPRO CARB STEADY)  237 mL Oral TID BM   heparin  5,000 Units Subcutaneous Q12H   hydrALAZINE  25 mg Oral Q8H   lanthanum  2,000 mg Oral TID WC   pantoprazole  40 mg Oral Daily   polyethylene glycol  17 g Oral Daily   senna-docusate  2  tablet Oral BID   sodium chloride flush  3 mL Intravenous Q12H   vitamin B-12  1,000 mcg Oral QPM

## 2021-03-20 NOTE — Plan of Care (Signed)

## 2021-03-20 NOTE — Progress Notes (Signed)
Progress Note   Patient: Jose Garrett. [id number]:[id number] DOB: Jul 06, 1950 DOA: 02/27/2021     20 DOS: the patient was seen and examined on 03/20/2021   Brief hospital course: 71 year old male with amyloidosis, chronic combined CHF, ESRD, CAD, peripheral vascular disease status post right BKA, severe AS status post TAVR presented to the hospital with increased weakness of bilateral arms, hands, fingers as well as wheelchair-bound for the past 3 weeks.  Patient has been unable to lift his arms above the shoulder which is completely new.  Imaging in the ED showed subacute spinal cord compression.  Neurosurgery and nephrology were consulted.  Patient took some time to come to a decision regarding surgery.  Underwent surgery on 03/17/2021. Seems to be stable for the most part.  Experiencing some pain in the neck area as well as some dysphagia.  Assessment and Plan * Spinal cord compression ([place])- (present on admission) -MRI on admission showed advanced degenerative changes resulting in severe spinal canal stenosis with cord compression from C3-4 through C5-6.   -Neurosurgery was consulted.  Patient underwent surgery on 03/17/2021.   Neurologically he is stable.  Neurosurgery continues to follow.  Changed over to soft diet due to dysphagia.  Continue pain medications as needed.  ESRD (end stage renal disease) on dialysis [facility]) He is on a Monday Wednesday Friday schedule.  Nephrology is following.    He was dialyzed off schedule on 1/9.  Will return to usual schedule starting today.  Essential hypertension, benign- (present on admission) Patient remains on amlodipine and hydralazine.   Elevated blood pressures noted after surgery likely due to pain issues.  Stable for the most part.  Continue to monitor.   Thrombocytopenia ([place])- (present on admission) Etiology unclear.  Counts have been stable.  Continue to monitor intermittently.  Anemia of chronic disease- (present on admission) Hemoglobin has been  stable.  Monitoring intermittently.  Coronary artery disease involving native coronary artery of native heart without angina pectoris- (present on admission) Most recent cath was done in May 2020 which showed severe single-vessel CAD with chronic total occlusion of the RCA, with collaterals.  Mild calcific nonobstructive LCA disease.   Continue statin. Plavix held on 03/11/2021 in preparation for neurosurgery on 03/17/2021. Discussed with Dr. Arnoldo Morale regarding resumption of Plavix.  He mentioned that it would be okay to resume at after Monday.  We will plan to resume on 1/10.  Hemoglobin noted to be stable.  PAD (peripheral artery disease) ([place])- (present on admission) Seen by vascular surgery as an outpatient, status post BKA on the right.  He has a femoropopliteal bypass graft on the left.  Chronic combined systolic and diastolic CHF (congestive heart failure) ([place])- (present on admission) Most recent 2D echo in August 2021 showed an EF of 45-50%, mild decreased LV function with global hypokinesis.  There was also grade 1 diastolic dysfunction.  RV was mildly reduced as well.  Volume managed by dialysis.  S/P TAVR (transcatheter aortic valve replacement) Stable.     Subjective: Pain in the neck is reasonably well controlled.  Tolerating soft diet.     Objective Vitals:   03/20/21 0827 03/20/21 0831 03/20/21 0900 03/20/21 0930  BP: (!) 145/50 (!) 156/63 (!) 126/49 (!) 118/49  Pulse: 82 82 79 80  Resp: 20     Temp: 98.2 F (36.8 C)     TempSrc: Oral     SpO2:      Weight: 68.5 kg     Height:  General appearance: Awake alert.  In no distress Continues to be in neck collar.  No obvious bleeding noted. Resp: Clear to auscultation bilaterally.  Normal effort Cardio: S1-S2 is normal regular.  No S3-S4.  No rubs murmurs or bruit GI: Abdomen is soft.  Nontender nondistended.  Bowel sounds are present normal.  No masses organomegaly Extremities: No edema.      Data  Reviewed:  Labs reviewed.  Noted to be stable.  Labs reviewed.  Stable for the most part.  Family Communication: Discussed with patient.  No family at bedside  Disposition: Will need to go to skilled nursing facility for rehab.  Hopefully in the next 24 to 48 hours  Status is: Inpatient  Remains inpatient appropriate because: Spinal surgery          Author: Bonnielee Haff, MD 03/20/2021 9:59 AM  For on call review [email or web address].

## 2021-03-20 NOTE — Progress Notes (Signed)
Occupational Therapy Treatment Patient Details Name: Jose Garrett. MRN: [id number] DOB: 1950/08/02 Today's Date: 03/20/2021   History of present illness 71 y.o. male admitted on 02/27/21 due to increased weakness of BUE weakness and has become WC bound in the past 3 weeks due to weakness. MRI shows subacute c-spine cord compression. He underwent ACDF 1/6.  PMH significant for amyloidosis, chronic combined CHF, ESRD, CAD, peripheral vascular disease status post right BKA, severe AS status post TAVR.   OT comments  [name] is progressing incrementally towards his acute goals. Pt reports that he has had improved ROM and sensation in his RUE since his ACDF on 1/6, however the L has remained the same. Reviewed cervical precautions, pt verbalized understanding but required verbal cues during functional tasks. Soft thera-putty exercises introduced to pt, he tolerated well and demonstrated good understanding. Left putty HEP in pt's room. Pt continues to benefit from OT acutely. D/c recommendation remains appropriate.    Recommendations for follow up therapy are one component of a multi-disciplinary discharge planning process, led by the attending physician.  Recommendations may be updated based on patient status, additional functional criteria and insurance authorization.    Follow Up Recommendations  Skilled nursing-short term rehab (<3 hours/day)    Assistance Recommended at Discharge Frequent or constant Supervision/Assistance  Patient Garrett return home with the following  A lot of help with walking and/or transfers;A lot of help with bathing/dressing/bathroom;Direct supervision/assist for medications management;Assist for transportation;Help with stairs or ramp for entrance   Equipment Recommendations  None recommended by OT       Precautions / Restrictions Precautions Precautions: Fall;Cervical Precaution Comments: educated on cervical precautions. Required Braces or Orthoses: Cervical  Brace Cervical Brace: Hard collar;Other (comment) Restrictions Weight Bearing Restrictions: No       Mobility Bed Mobility Overal bed mobility: Needs Assistance             General bed mobility comments: in chair upon arrival    Transfers     Balance Overall balance assessment: Needs assistance Sitting-balance support: Feet supported;No upper extremity supported Sitting balance-Leahy Scale: Good           ADL either performed or assessed with clinical judgement   ADL Overall ADL's : Needs assistance/impaired     Grooming: Set up;With adaptive equipment;Sitting Grooming Details (indicate cue type and reason): pt continues to demonstrate great understanding/ability to use AE: build up handles, cup with handles, plate guard, etc.             General ADL Comments: session focused on BUE exercises and compensatory techniques. Pt reported just getting into the chiar upon arrival.    Extremity/Trunk Assessment Upper Extremity Assessment Upper Extremity Assessment: Defer to OT evaluation RUE Deficits / Details: Poor coordination, weakness, and tingly/diminished feeling - improved ROM since surgery, pt is able to fully extend actively. grip strength is 3-/5 RUE Sensation: decreased light touch;decreased proprioception LUE Deficits / Details: Poor coordination, weakness, and tingly/diminished feeling - radial aspect of hand has better AROM than ulnar. PROM is [facility]. strength is 2/5 LUE Sensation: decreased light touch;decreased proprioception LUE Coordination: decreased fine motor;decreased gross motor   Lower Extremity Assessment Lower Extremity Assessment: Defer to PT evaluation        Vision   Vision Assessment?: No apparent visual deficits   Perception Perception Perception: Not tested   Praxis Praxis Praxis: Impaired    Cognition Arousal/Alertness: Awake/alert Behavior During Therapy: WFL for tasks assessed/performed Overall Cognitive Status: Within  Functional Limits for  tasks assessed           General Comments: Pt unable to recall cervical precautions, required re-education and verbal cues during funcitonal tasks          Exercises Other Exercises Other Exercises: soft theraputty dexterity exercises. encouraged pt to complet 3 repetion each, 3x/day Other Exercises: general UB strengthing against gravity to adhere to cervical precautions: biceps curls and tricep extensions      General Comments VSS on RA - pt experienced coughing attack after drinking from his cup wtih straw    Pertinent Vitals/ Pain       Pain Assessment: Faces Faces Pain Scale: Hurts a little bit Pain Location: neck when he swallows Pain Descriptors / Indicators: Grimacing;Guarding;Discomfort Pain Intervention(s): Limited activity within patient's tolerance;Monitored during session   Frequency  Min 2X/week        Progress Toward Goals  OT Goals(current goals Garrett now be found in the care plan section)  Progress towards OT goals: Progressing toward goals  Acute Rehab OT Goals OT Goal Formulation: With patient Time For Goal Achievement: 03/30/21 Potential to Achieve Goals: Fair ADL Goals Pt Will Perform Grooming: with set-up;sitting Pt Will Perform Lower Body Bathing: with min guard assist;with adaptive equipment;sitting/lateral leans;sit to/from stand Pt Will Perform Lower Body Dressing: with min assist;sitting/lateral leans;sit to/from stand;with adaptive equipment Pt Will Transfer to Toilet: with min assist;with transfer board Pt Will Perform Toileting - Clothing Manipulation and hygiene: with min assist;sitting/lateral leans;sit to/from stand;with adaptive equipment  Plan Discharge plan remains appropriate;Frequency remains appropriate    Co-evaluation                 AM-PAC OT "6 Clicks" Daily Activity     Outcome Measure   Help from another person eating meals?: A Little Help from another person taking care of personal  grooming?: A Little Help from another person toileting, which includes using toliet, bedpan, or urinal?: A Lot Help from another person bathing (including washing, rinsing, drying)?: A Lot Help from another person to put on and taking off regular upper body clothing?: A Little Help from another person to put on and taking off regular lower body clothing?: A Lot 6 Click Score: 15    End of Session    OT Visit Diagnosis: Unsteadiness on feet (R26.81);Other abnormalities of gait and mobility (R26.89);Muscle weakness (generalized) (M62.81)   Activity Tolerance Patient tolerated treatment well   Patient Left in chair;with call bell/phone within reach   Nurse Communication Mobility status        Time: 8099-8338 OT Time Calculation (min): 12 min  Charges: OT General Charges $OT Visit: 1 Visit OT Treatments $Therapeutic Exercise: 8-22 mins   Alexismarie Flaim A Kevron Patella 03/20/2021, 3:57 PM

## 2021-03-21 ENCOUNTER — Ambulatory Visit: Payer: Medicare Other | Admitting: Occupational Therapy

## 2021-03-21 DIAGNOSIS — R1310 Dysphagia, unspecified: Secondary | ICD-10-CM

## 2021-03-21 LAB — BPAM RBC
Blood Product Expiration Date: 202301162359
Blood Product Expiration Date: 202301202359
ISSUE DATE / TIME: 202212290842
Unit Type and Rh: 5100
Unit Type and Rh: 5100

## 2021-03-21 LAB — TYPE AND SCREEN
ABO/RH(D): O POS
Antibody Screen: NEGATIVE
Donor AG Type: NEGATIVE
Donor AG Type: NEGATIVE
Unit division: 0
Unit division: 0

## 2021-03-21 LAB — RENAL FUNCTION PANEL
Albumin: 3.3 g/dL — ABNORMAL LOW (ref 3.5–5.0)
Anion gap: 15 (ref 5–15)
BUN: 36 mg/dL — ABNORMAL HIGH (ref 8–23)
CO2: 26 mmol/L (ref 22–32)
Calcium: 9.4 mg/dL (ref 8.9–10.3)
Chloride: 95 mmol/L — ABNORMAL LOW (ref 98–111)
Creatinine, Ser: 7.07 mg/dL — ABNORMAL HIGH (ref 0.61–1.24)
GFR, Estimated: 8 mL/min — ABNORMAL LOW (ref >60)
Glucose, Bld: 89 mg/dL (ref 70–99)
Phosphorus: 6.1 mg/dL — ABNORMAL HIGH (ref 2.5–4.6)
Potassium: 4 mmol/L (ref 3.5–5.1)
Sodium: 136 mmol/L (ref 135–145)

## 2021-03-21 MED ORDER — ALUM & MAG HYDROXIDE-SIMETH 200-200-20 MG/5ML PO SUSP
30.0000 mL | Freq: Four times a day (QID) | ORAL | Status: DC | PRN
  Administered 2021-03-21 – 2021-03-23 (×4): 30 mL via ORAL
  Filled 2021-03-21 (×4): qty 30

## 2021-03-21 MED ORDER — DEXAMETHASONE SODIUM PHOSPHATE 4 MG/ML IJ SOLN
4.0000 mg | Freq: Four times a day (QID) | INTRAMUSCULAR | Status: DC
  Administered 2021-03-21 – 2021-03-24 (×12): 4 mg via INTRAVENOUS
  Filled 2021-03-21 (×12): qty 1

## 2021-03-21 MED ORDER — HYDRALAZINE HCL 50 MG PO TABS
50.0000 mg | ORAL_TABLET | Freq: Three times a day (TID) | ORAL | Status: DC
  Administered 2021-03-21 – 2021-03-24 (×7): 50 mg via ORAL
  Filled 2021-03-21 (×8): qty 1

## 2021-03-21 MED ORDER — PANTOPRAZOLE 2 MG/ML SUSPENSION: 40.0000 mg | Freq: Two times a day (BID) | ORAL | Status: DC

## 2021-03-21 MED ORDER — PANTOPRAZOLE SODIUM 40 MG PO TBEC
40.0000 mg | DELAYED_RELEASE_TABLET | Freq: Two times a day (BID) | ORAL | Status: DC
  Filled 2021-03-21: qty 1

## 2021-03-21 MED ORDER — PANTOPRAZOLE 2 MG/ML SUSPENSION
40.0000 mg | Freq: Two times a day (BID) | ORAL | Status: DC
  Administered 2021-03-21 – 2021-03-24 (×7): 40 mg via ORAL
  Filled 2021-03-21 (×7): qty 20

## 2021-03-21 NOTE — Progress Notes (Signed)
Progress Note   Patient: Jose Garrett. YOV:[id number] DOB: 1950/11/15 DOA: 02/27/2021     21 DOS: the patient was seen and examined on 03/21/2021   Brief hospital course: 71 year old male with amyloidosis, chronic combined CHF, ESRD, CAD, peripheral vascular disease status post right BKA, severe AS status post TAVR presented to the hospital with increased weakness of bilateral arms, hands, fingers as well as wheelchair-bound for the past 3 weeks.  Patient has been unable to lift his arms above the shoulder which is completely new.  Imaging in the ED showed subacute spinal cord compression.  Neurosurgery and nephrology were consulted.  Patient took some time to come to a decision regarding surgery.  Underwent surgery on 03/17/2021. Patient continues to have neck pain along with difficulty swallowing.  Experiencing acid reflux as well.  Has not noted any improvement in his swallowing difficulty.  Occasionally tends to regurgitate.  Assessment and Plan * Spinal cord compression ([place])- (present on admission) -MRI on admission showed advanced degenerative changes resulting in severe spinal canal stenosis with cord compression from C3-4 through C5-6.   -Neurosurgery was consulted.  Patient underwent ACDF C3-4, C4-5 C5-6 on 03/17/2021. Stable from a neurological standpoint.  However patient experiencing significant dysphagia.  Speech therapy is following.  See below. Will request neurosurgery to reevaluate patient today.  Dysphagia Experiencing significant dysphagia. HPI seen the patient.  He has been downgraded to dysphagia 2 diet.   We will request neurosurgery to evaluate patient as this could be related to his recent neck surgery. He is experiencing some acid reflux.  He is already on PPI.  Will increase to twice a day  ESRD (end stage renal disease) on dialysis [facility]) He is on a Monday Wednesday Friday schedule.  Nephrology is following.      Essential hypertension, benign- (present on  admission) Patient remains on amlodipine and hydralazine.  Blood pressure remains poorly controlled.  His discomfort likely is contributing.  Increase the dose of his hydralazine.  Will not be too aggressive since he is on hemodialysis.  Thrombocytopenia ([place])- (present on admission) Etiology unclear.  Counts have been stable.  Continue to monitor intermittently.  Anemia of chronic disease- (present on admission) Hemoglobin has been stable.  Monitoring intermittently.  Coronary artery disease involving native coronary artery of native heart without angina pectoris- (present on admission) Most recent cath was done in May 2020 which showed severe single-vessel CAD with chronic total occlusion of the RCA, with collaterals.  Mild calcific nonobstructive LCA disease.   Continue statin. Plavix held on 03/11/2021 in preparation for neurosurgery on 03/17/2021. Discussed with Dr. Arnoldo Morale regarding resumption of Plavix.  He mentioned that it would be okay to resume after Monday. Plan was to resume Plavix today but since patient is experiencing dysphagia we will hold off for now in case he needs further interventions.  PAD (peripheral artery disease) ([place])- (present on admission) Seen by vascular surgery as an outpatient, status post BKA on the right.  He has a femoropopliteal bypass graft on the left.  Chronic combined systolic and diastolic CHF (congestive heart failure) ([place])- (present on admission) Most recent 2D echo in August 2021 showed an EF of 45-50%, mild decreased LV function with global hypokinesis.  There was also grade 1 diastolic dysfunction.  RV was mildly reduced as well.  Volume managed by dialysis.  S/P TAVR (transcatheter aortic valve replacement) Stable.     Subjective: Continues to have pain in the neck area.  Continues to have difficulty swallowing  with some regurgitation.   Objective Vitals:   03/21/21 0516 03/21/21 0755 03/21/21 0800 03/21/21 0802  BP: (!) 168/67 (!)  178/53  (!) 177/57  Pulse: 79 73  79  Resp: 17 18    Temp: 99.1 F (37.3 C)  98.5 F (36.9 C)   TempSrc: Axillary  Oral   SpO2: 98% 95%    Weight:      Height:        General appearance: Awake alert.  In no distress Continues to be neck collar Resp: Clear to auscultation bilaterally.  Normal effort Cardio: S1-S2 is normal regular.  No S3-S4.  No rubs murmurs or bruit GI: Abdomen is soft.  Nontender nondistended.  Bowel sounds are present normal.  No masses organomegaly Extremities: No edema.     Data Reviewed:  Labs reviewed.  Noted to be stable.  Labs reviewed.  Stable for the most part.  Family Communication: Discussed with patient.  No family at bedside  Disposition: Will need to go to skilled nursing facility for rehab.  Hopefully in the next few days once dysphagia has improved.  Status is: Inpatient  Remains inpatient appropriate because: Spinal surgery          Author: Bonnielee Haff, MD 03/21/2021 9:30 AM  For on call review [email or web address].

## 2021-03-21 NOTE — Progress Notes (Addendum)
Received call from [place] yesterday regarding pt's interest on going to [facility] at d/c. SNF requesting pt be changed to [facility] SW on MWF at 10:00 appt if possible. Contacted clinic this am to inquire about availability. Will await response.   Melven Sartorius Renal Navigator [phone]  Addendum at 2:34 pm: Spoke to Debra at [facility] has a MWF 12:00 chair time (11:30 arrival) available. In a week, clinic may have a 10:15 chair time available. Communicated this info to CSW who states [facility] snf can accommodate 11:30 arrival/12:00 chair time. Contacted [facility] to request pt be re-clipped to SW to receive HD while in rehab. [facility] aware pt will return to their clinic once rehab is completed. Debra at [facility] aware of the above. Debra aware pt may start on Friday.

## 2021-03-21 NOTE — Progress Notes (Signed)
Physical Therapy Treatment Patient Details Name: Jose Garrett. MRN: [id number] DOB: August 17, 1950 Today's Date: 03/21/2021   History of Present Illness 71 y.o. male admitted on 02/27/21 due to increased weakness of BUE weakness and has become WC bound in the past 3 weeks due to weakness. MRI shows subacute c-spine cord compression. He underwent ACDF 1/6.  PMH significant for amyloidosis, chronic combined CHF, ESRD, CAD, peripheral vascular disease status post right BKA, severe AS status post TAVR.    PT Comments    Patient progressing with mobility able to ambulate a little further than when I saw him prior to surgery and this is first walk since surgery.  He feels R hand is stronger and feel LE's able to tolerate increased distance, but demonstrates ataxic gait, decreased activity tolerance and poor neuromuscular feedback for postural stability.  He will continue to benefit from skilled PT in the acute setting as well.  Recommendations for STSNF level rehab remain appropriate.    Recommendations for follow up therapy are one component of a multi-disciplinary discharge planning process, led by the attending physician.  Recommendations may be updated based on patient status, additional functional criteria and insurance authorization.  Follow Up Recommendations  Skilled nursing-short term rehab (<3 hours/day)     Assistance Recommended at Discharge Frequent or constant Supervision/Assistance  Patient can return home with the following Two people to help with walking and/or transfers;A lot of help with bathing/dressing/bathroom;Assistance with cooking/housework;Assist for transportation;Help with stairs or ramp for entrance   Equipment Recommendations  None recommended by PT    Recommendations for Other Services       Precautions / Restrictions Precautions Precautions: Fall;Cervical Required Braces or Orthoses: Cervical Brace Cervical Brace: Hard collar     Mobility  Bed  Mobility Overal bed mobility: Needs Assistance Bed Mobility: Rolling;Sidelying to Sit Rolling: Supervision Sidelying to sit: HOB elevated;Mod assist       General bed mobility comments: assist for trunk and legs with education to hook on PT's arm since hands on L UE weak    Transfers Overall transfer level: Needs assistance Equipment used: Bilateral platform walker Transfers: Sit to/from Stand Sit to Stand: Mod assist;From elevated surface           General transfer comment: up to stand, pt using legs braced against bed    Ambulation/Gait Ambulation/Gait assistance: +2 safety/equipment;Mod assist Gait Distance (Feet): 22 Feet Assistive device: Bilateral platform walker (eva walker) Gait Pattern/deviations: Step-to pattern;Decreased stride length;Narrow base of support;Decreased dorsiflexion - left;Decreased dorsiflexion - right;Decreased weight shift to left;Ataxic       General Gait Details: assist for core stability, cues for foot position to avoid crossing over, assist and cues for posture and walker management throughout   Stairs             Wheelchair Mobility    Modified Rankin (Stroke Patients Only)       Balance Overall balance assessment: Needs assistance Sitting-balance support: Feet supported Sitting balance-Leahy Scale: Good Sitting balance - Comments: donned prosthesis with min to mod A   Standing balance support: Bilateral upper extremity supported;Reliant on assistive device for balance;During functional activity Standing balance-Leahy Scale: Poor Standing balance comment: external assist for standing and heavy UE support                            Cognition Arousal/Alertness: Awake/alert Behavior During Therapy: WFL for tasks assessed/performed  Exercises      General Comments General comments (skin integrity, edema, etc.): VSS on RA      Pertinent  Vitals/Pain Pain Location: neck when he swallows Pain Descriptors / Indicators: Grimacing;Guarding;Discomfort Pain Intervention(s): Monitored during session;Repositioned    Home Living                          Prior Function            PT Goals (current goals can now be found in the care plan section) Progress towards PT goals: Progressing toward goals    Frequency    Min 2X/week      PT Plan Current plan remains appropriate    Co-evaluation              AM-PAC PT "6 Clicks" Mobility   Outcome Measure  Help needed turning from your back to your side while in a flat bed without using bedrails?: A Little Help needed moving from lying on your back to sitting on the side of a flat bed without using bedrails?: A Lot Help needed moving to and from a bed to a chair (including a wheelchair)?: Total Help needed standing up from a chair using your arms (e.g., wheelchair or bedside chair)?: A Lot Help needed to walk in hospital room?: A Lot Help needed climbing 3-5 steps with a railing? : Total 6 Click Score: 11    End of Session Equipment Utilized During Treatment: Gait belt Activity Tolerance: Patient limited by fatigue Patient left: in chair;with call bell/phone within reach;with chair alarm set   PT Visit Diagnosis: Other abnormalities of gait and mobility (R26.89);Muscle weakness (generalized) (M62.81)     Time: 5170-0174 PT Time Calculation (min) (ACUTE ONLY): 28 min  Charges:  $Gait Training: 8-22 mins $Therapeutic Activity: 8-22 mins                     [name], PT Acute Rehabilitation Services Pager:[phone] Office:[phone] 03/21/2021    [name] 03/21/2021, 4:41 PM

## 2021-03-21 NOTE — Progress Notes (Signed)
Nephrology Progress Note:   Patient ID: Jose Yanke., male   DOB: [birth year], 71 y.o.   MRN: [id number]  Subjective: no c/o, up in chair   Physical exam:  Gen: adult male in NAD HEENT in a cervical collar CVS: S1S2 no rub Resp:CTA and normal work of breathing on room air Abd: soft, NT/nd Ext: R bka, no edema Neuro: alert and oriented x 3 Dialysis access: R AVF +t/b  OP HD: MWF [facility]  4h  400/1.5  71.9kg  2/2 bath  Hep 2400  RUA AVF Mircera 75 mcg q4wks - last 02/15/21 Calcitriol 4mcg PO qHD-last 02/27/21      Assessment/Plan: SP C3- C6 ACDF - by Dr Kathyrn Sheriff on 03/17/21 for severe cervical spinal stenosis w/ bilat UE numbness and weakness  ESRD - MWF HD. Next HD Wed.   HTN /volume- No vol excess on exam, 3- 4 kg under, losing body wt here. Usually does not have volume issues. We started hydralazine at 25 mg tid here for HTN. Low UF goal 1 L.  Anemia of CKD - pause ESA - had been on aranesp 100 mcg every Monday and last Hb 11.4. anticipate will need to resume lower dose here Secondary Hyperparathyroidism - hyperphos; improved on lanthanum. on calcitriol  Dispo - per primary team  [name], MD 03/21/2021, 3:29 PM     Studies/Results: No results found.  (feeding supplement) PROSource Plus  30 mL Oral TID BM   amiodarone  200 mg Oral Daily   amLODipine  10 mg Oral Daily   atorvastatin  80 mg Oral Q supper   calcitRIOL  1 mcg Oral Q M,W,F-HD   Chlorhexidine Gluconate Cloth  6 each Topical Q0600   Chlorhexidine Gluconate Cloth  6 each Topical Q0600   dexamethasone (DECADRON) injection  4 mg Intravenous Q6H   docusate sodium  100 mg Oral BID   feeding supplement (NEPRO CARB STEADY)  237 mL Oral TID BM   heparin  5,000 Units Subcutaneous Q12H   hydrALAZINE  50 mg Oral Q8H   lanthanum  2,000 mg Oral TID WC   pantoprazole sodium  40 mg Oral BID   polyethylene glycol  17 g Oral Daily   senna-docusate  2 tablet Oral BID   sodium chloride flush  3 mL Intravenous Q12H    vitamin B-12  1,000 mcg Oral QPM

## 2021-03-21 NOTE — Assessment & Plan Note (Addendum)
Experiencing significant dysphagia. HPI seen the patient.  He has been downgraded to dysphagia 2 diet.   We will request neurosurgery to evaluate patient as this could be related to his recent neck surgery. He is experiencing some acid reflux.  He is already on PPI.  Will increase to twice a day

## 2021-03-21 NOTE — TOC Progression Note (Signed)
Transition of Care (TOC) - Progression Note    Patient Details  Name: Jose Garrett. MRN: [id number] Date of Birth: 07-31-1950  Transition of Care [facility]) CM/SW [place], [place] Phone Number: 03/21/2021, 1:58 PM  Clinical Narrative:   CSW reached out to pending SNF for them to review patient. Patient received additional bed offers at [facility] and at [place]. CSW provided offers to patient, he requested that CSW contact daughter [name] to discuss. Patient said he didn't care either way. CSW spoke with [name], she would like to choose [facility]. [place] will need patient's dialysis changed. CSW contacted renal navigator to discuss, she will follow up. CSW to follow.    Expected Discharge Plan: Skilled Nursing Facility Barriers to Discharge: Continued Medical Work up, Ship broker  Expected Discharge Plan and Services Expected Discharge Plan: [place] Choice: [place] arrangements for the past 2 months: Single Family Home                                       Social Determinants of Health (SDOH) Interventions    Readmission Risk Interventions Readmission Risk Prevention Plan 04/15/2019 10/23/2018 08/20/2018  Transportation Screening Complete Complete -  PCP or Specialist Appt within 3-5 Days - Complete -  HRI or [place] - Complete -  Social Work Consult for [place] Planning/Counseling - Complete -  Palliative Care Screening - Not Applicable -  Medication Review Press photographer) Complete Complete -  PCP or Specialist appointment within 3-5 days of discharge Complete - Complete  HRI or Home Care Consult Complete - Complete  SW Recovery Care/Counseling Consult Complete - Complete  Palliative Care Screening Not Applicable - Not [place] Patient Refused - Not Applicable  Some recent data might be hidden

## 2021-03-21 NOTE — Evaluation (Signed)
Clinical/Bedside Swallow Evaluation Patient Details  Name: Jose Garrett. MRN: [id number] Date of Birth: 04-04-1950  Today's Date: 03/21/2021 Time: SLP Start Time (ACUTE ONLY): 0810 SLP Stop Time (ACUTE ONLY): 0830 SLP Time Calculation (min) (ACUTE ONLY): 20 min  Past Medical History:  Past Medical History:  Diagnosis Date   Amyloidosis ([place])    Anemia of chronic disease    Anxiety    CAD (coronary artery disease)    a. cardiac cath on 11/25/17 with occlusion of the RCA which could have been his event but the RCA could not be engaged. The RCA filled distally from left to right collaterals. There were no severe stenoses in the left coronary system. Medical therapy recommended, started on plavix.   Cancer [facility])    multiple myeloma   Chronic combined systolic and diastolic CHF (congestive heart failure) ([place])    a. EF dropped to 35-40% with grade 2 DD by echo 11/2017.   DJD (degenerative joint disease), lumbar    Dysrhythmia    afib   ESRD (end stage renal disease) (HCC)    ESRD- HEMO MWF- [address]   GERD (gastroesophageal reflux disease)    Heart murmur    Hyperlipidemia    Hypertension    Myocardial infarction (HCC)    Neuropathy    Peripheral vascular disease (HCC)    Pneumonia    PONV (postoperative nausea and vomiting)    Restless legs    S/P TAVR (transcatheter aortic valve replacement) 10/21/2018   26 mm Edwards Sapien 3 Ultra transcatheter heart valve placed via percutaneous right transfemoral approach    Severe aortic stenosis    severe   Past Surgical History:  Past Surgical History:  Procedure Laterality Date   ABDOMINAL AORTOGRAM W/LOWER EXTREMITY Bilateral 07/16/2018   Procedure: ABDOMINAL AORTOGRAM W/LOWER EXTREMITY;  Surgeon: Wellington Hampshire, MD;  Location: [place] CV LAB;  Service: Cardiovascular;  Laterality: Bilateral;   ABDOMINAL AORTOGRAM W/LOWER EXTREMITY N/A 04/09/2019   Procedure: ABDOMINAL AORTOGRAM W/LOWER EXTREMITY - Right Lower;   Surgeon: Waynetta Sandy, MD;  Location: [place] CV LAB;  Service: Cardiovascular;  Laterality: N/A;   AMPUTATION Left 08/19/2018   Procedure: AMPUTATION LEFT TOES SECOND AND THIRD;  Surgeon: Waynetta Sandy, MD;  Location: [place];  Service: Vascular;  Laterality: Left;   AMPUTATION Right 05/07/2019   Procedure: AMPUTATION BELOW KNEE;  Surgeon: Serafina Mitchell, MD;  Location: [facility] OR;  Service: Vascular;  Laterality: Right;   ANTERIOR CERVICAL DECOMP/DISCECTOMY FUSION N/A 03/17/2021   Procedure: Cervical three to cervial six anterior cervical decompression, discectomy and fusion;  Surgeon: Consuella Lose, MD;  Location: [place];  Service: Neurosurgery;  Laterality: N/A;   AV FISTULA PLACEMENT Right 12/27/2015   Procedure: Right Arm ARTERIOVENOUS (AV) FISTULA CREATION;  Surgeon: Angelia Mould, MD;  Location: [place];  Service: Vascular;  Laterality: Right;   CARDIAC CATHETERIZATION     CARDIAC VALVE REPLACEMENT     CATARACT EXTRACTION W/ INTRAOCULAR LENS  IMPLANT, BILATERAL     COLONOSCOPY     ERCP W/ SPHICTEROTOMY     EYE SURGERY     FEMORAL-POPLITEAL BYPASS GRAFT Left 07/22/2018   Procedure: left FEMORAL TO BELOW KNEE POPLITEAL ARTERY BYPASS GREAFT using 36mm removable ring propaten gore graft;  Surgeon: Waynetta Sandy, MD;  Location: [place];  Service: Vascular;  Laterality: Left;   FEMORAL-POPLITEAL BYPASS GRAFT Right 04/10/2019   Procedure: BYPASS GRAFT RIGHT FEMORAL-POPLITEAL ARTERY;  Surgeon: Rosetta Posner, MD;  Location:  [facility] OR;  Service: Vascular;  Laterality: Right;   GANGLION CYST EXCISION Left 11/02/2020   Procedure: EXCISION OF LEFT WRIST DORAL VOLAR MASSES;  Surgeon: Charlotte Crumb, MD;  Location: [place];  Service: Orthopedics;  Laterality: Left;  CPT 25073   I & D EXTREMITY Right 05/05/2019   Procedure: Revision of  RIGHT  FOOT Amputation;  Surgeon: Waynetta Sandy, MD;  Location: [place];  Service: Vascular;  Laterality: Right;    INTRAOPERATIVE TRANSTHORACIC ECHOCARDIOGRAM N/A 10/21/2018   Procedure: Intraoperative Transthoracic Echocardiogram;  Surgeon: Sherren Mocha, MD;  Location: [place];  Service: Open Heart Surgery;  Laterality: N/A;   IR GENERIC HISTORICAL  01/27/2016   IR FLUORO GUIDE CV LINE RIGHT 01/27/2016 Arne Cleveland, MD [facility]-INTER[facility]   IR GENERIC HISTORICAL  01/27/2016   IR US GUIDE VASC ACCESS RIGHT 01/27/2016 Arne Cleveland, MD [facility]   LAPAROSCOPIC CHOLECYSTECTOMY  03/2011   LEFT HEART CATH AND CORONARY ANGIOGRAPHY N/A 11/25/2017   Procedure: LEFT HEART CATH AND CORONARY ANGIOGRAPHY;  Surgeon: Martinique, Peter M, MD;  Location: [place] CV LAB;  Service: Cardiovascular;  Laterality: N/A;   LEFT HEART CATH AND CORONARY ANGIOGRAPHY N/A 07/24/2018   Procedure: LEFT HEART CATH AND CORONARY ANGIOGRAPHY;  Surgeon: Sherren Mocha, MD;  Location: [place] CV LAB;  Service: Cardiovascular;  Laterality: N/A;   RENAL BIOPSY, PERCUTANEOUS Right 09/08/2014   RIGHT/LEFT HEART CATH AND CORONARY ANGIOGRAPHY N/A 03/25/2018   Procedure: RIGHT/LEFT HEART CATH AND CORONARY ANGIOGRAPHY;  Surgeon: Martinique, Peter M, MD;  Location: [place] CV LAB;  Service: Cardiovascular;  Laterality: N/A;   TEE WITHOUT CARDIOVERSION N/A 05/14/2019   Procedure: TRANSESOPHAGEAL ECHOCARDIOGRAM (TEE);  Surgeon: Fay Records, MD;  Location: [facility] ENDOSCOPY;  Service: Cardiovascular;  Laterality: N/A;   TOE AMPUTATION Left 08/19/2018   2nd & 3rd toes   TONSILLECTOMY  ~ 1960   TRANSCATHETER AORTIC VALVE REPLACEMENT, TRANSFEMORAL N/A 10/21/2018   Procedure: TRANSCATHETER AORTIC VALVE REPLACEMENT, TRANSFEMORAL;  Surgeon: Sherren Mocha, MD;  Location: [place];  Service: Open Heart Surgery;  Laterality: N/A;   TRANSMETATARSAL AMPUTATION Right 04/29/2019   Procedure: RIGHT TRANSMETATARSAL AMPUTATION;  Surgeon: Waynetta Sandy, MD;  Location: [place];  Service: Vascular;  Laterality: Right;   ULTRASOUND GUIDANCE FOR VASCULAR ACCESS   03/25/2018   Procedure: Ultrasound Guidance For Vascular Access;  Surgeon: Martinique, Peter M, MD;  Location: [place] CV LAB;  Service: Cardiovascular;;   HPI:  71 year old male who presented to the hospital with increased weakness of bilateral arms, hands, fingers as well as wheelchair-bound for 3 weeks.  Patient  unable to lift his arms above the shoulder which was completely new.  Imaging showed subacute spinal cord compression.   Underwent surgery on 03/17/2021: C3-4, C4-5, C5-6 ACDF by Dr. Kathyrn Sheriff on 03/17/2021. Pt reports dysphagia. PMH: amyloidosis, chronic combined CHF, ESRD, CAD, peripheral vascular disease status post right BKA, severe AS status post TAVR.    Assessment / Plan / Recommendation  Clinical Impression  Pt demonstrates signs of acute pharyngeal dysphagia following ACDF. Pt reports globus, odynophagia. Observed to ahve wet vocal quality, coughing and expectoration with water and throat clearing and multiple swallows with nectar and puree. Subjectively pt appeared to tolerate nectar relatively well with less coughing and report of discomfort. Given that pt has been consuming some PO since surgery (minced meats and liquids) without severe concern for pneumonia, recommend modifying pts diet to nectar thick liquids and minced (dys 2) solids with compensatory strategies. Pt able to clear  throat and swallow again to attempt to prevent coughing. If pt does not tolerate diet over next 24 hour will procced with instumental testing. Pt reports he would liek to avoid short term alternate means of nutrition at this time. SLP Visit Diagnosis: Dysphagia, oropharyngeal phase (R13.12)    Aspiration Risk  Moderate aspiration risk;Risk for inadequate nutrition/hydration    Diet Recommendation Dysphagia 2 (Fine chop);Nectar-thick liquid   Liquid Administration via: Straw Medication Administration: Crushed with puree Supervision: Staff to assist with self feeding Compensations: Slow rate;Small  sips/bites;Multiple dry swallows after each bite/sip;Follow solids with liquid;Clear throat intermittently Postural Changes: Seated upright at 90 degrees;Remain upright for at least 30 minutes after po intake    Other  Recommendations Oral Care Recommendations: Oral care BID Other Recommendations: Have oral suction available    Recommendations for follow up therapy are one component of a multi-disciplinary discharge planning process, led by the attending physician.  Recommendations may be updated based on patient status, additional functional criteria and insurance authorization.  Follow up Recommendations Skilled nursing-short term rehab (<3 hours/day)      Assistance Recommended at Discharge Intermittent Supervision/Assistance  Functional Status Assessment Patient has had a recent decline in their functional status and demonstrates the ability to make significant improvements in function in a reasonable and predictable amount of time.  Frequency and Duration min 2x/week  2 weeks       Prognosis Prognosis for Safe Diet Advancement: Good      Swallow Study   General HPI: 71 year old male who presented to the hospital with increased weakness of bilateral arms, hands, fingers as well as wheelchair-bound for 3 weeks.  Patient  unable to lift his arms above the shoulder which was completely new.  Imaging showed subacute spinal cord compression.   Underwent surgery on 03/17/2021: C3-4, C4-5, C5-6 ACDF by Dr. Kathyrn Sheriff on 03/17/2021. Pt reports dysphagia. PMH: amyloidosis, chronic combined CHF, ESRD, CAD, peripheral vascular disease status post right BKA, severe AS status post TAVR. Type of Study: Bedside Swallow Evaluation Previous Swallow Assessment: none Diet Prior to this Study: Regular;Thin liquids Temperature Spikes Noted: No Respiratory Status: Room air History of Recent Intubation: Yes Length of Intubations (days): 1 days Date extubated: 03/17/21 Behavior/Cognition: Alert;Pleasant  mood;Cooperative Oral Cavity Assessment: Dry Oral Cavity - Dentition: Edentulous;Dentures, not available Vision: Functional for self-feeding Self-Feeding Abilities: Needs assist Patient Positioning:  (edge of bed) Baseline Vocal Quality: Normal Volitional Cough: Strong Volitional Swallow: Able to elicit    Oral/Motor/Sensory Function Overall Oral Motor/Sensory Function: Within functional limits   Ice Chips Ice chips: Not tested   Thin Liquid Thin Liquid: Impaired Presentation: Straw Pharyngeal  Phase Impairments: Multiple swallows;Wet Vocal Quality;Decreased hyoid-laryngeal movement;Throat Clearing - Immediate;Cough - Immediate    Nectar Thick Nectar Thick Liquid: Impaired Presentation: Straw Pharyngeal Phase Impairments: Throat Clearing - Immediate;Multiple swallows   Honey Thick Honey Thick Liquid: Not tested   Puree Puree: Impaired Pharyngeal Phase Impairments: Multiple swallows;Throat Clearing - Immediate   Solid     Solid: Not tested      Lynann Beaver 03/21/2021,9:24 AM

## 2021-03-21 NOTE — TOC Progression Note (Signed)
Transition of Care (TOC) - Progression Note    Patient Details  Name: Kazi Reppond. MRN: [id number] Date of Birth: 1950/05/22  Transition of Care [facility]) CM/SW [place], [place] Phone Number: 03/21/2021, 3:02 PM  Clinical Narrative:   CSW alerted by renal navigator of change to patient's dialysis schedule for SNF, and CSW confirmed plans with [facility]. CSW updated daughter via phone. Per MD, not medically ready at this time. CSW to continue to follow for SNF placement when medically clear.    Expected Discharge Plan: Skilled Nursing Facility Barriers to Discharge: Continued Medical Work up, Ship broker  Expected Discharge Plan and Services Expected Discharge Plan: [place] Choice: [place] arrangements for the past 2 months: Single Family Home                                       Social Determinants of Health (SDOH) Interventions    Readmission Risk Interventions Readmission Risk Prevention Plan 04/15/2019 10/23/2018 08/20/2018  Transportation Screening Complete Complete -  PCP or Specialist Appt within 3-5 Days - Complete -  HRI or [place] - Complete -  Social Work Consult for [place] Planning/Counseling - Complete -  Palliative Care Screening - Not Applicable -  Medication Review Press photographer) Complete Complete -  PCP or Specialist appointment within 3-5 days of discharge Complete - Complete  HRI or Home Care Consult Complete - Complete  SW Recovery Care/Counseling Consult Complete - Complete  Palliative Care Screening Not Applicable - Not [place] Patient Refused - Not Applicable  Some recent data might be hidden

## 2021-03-21 NOTE — Progress Notes (Signed)
°  NEUROSURGERY PROGRESS NOTE   No issues overnight. Pt does c/o some difficulty swallowing, but appears to be tolerating soft diet. C/o minimal neck pain today. Reports "a couple percent" improvement in hand strength/dexterity.  EXAM:  BP (!) 177/57 (BP Location: Left Arm)    Pulse 79    Temp 98.5 F (36.9 C) (Oral)    Resp 18    Ht 5\' 10"  (1.778 m)    Wt 68.2 kg    SpO2 95%    BMI 21.57 kg/m   Awake, alert, oriented  Speech fluent, appropriate  CN grossly intact  Unchanged diffuse 4/5 weakness BUE/BLE  IMPRESSION:  71 y.o. male s/p ACDF, recovering appropriately  PLAN: - Can add short course of steroids for dysphagia - Will plan on outpatient neurosurgical f/u in 2-3 weeks upon d/c. - Cont Aspen collar when up, does not require collar while eating, bathing, in bed.   Consuella Lose, MD [facility]

## 2021-03-22 LAB — RENAL FUNCTION PANEL
Albumin: 3.3 g/dL — ABNORMAL LOW (ref 3.5–5.0)
Anion gap: 18 — ABNORMAL HIGH (ref 5–15)
BUN: 60 mg/dL — ABNORMAL HIGH (ref 8–23)
CO2: 22 mmol/L (ref 22–32)
Calcium: 9.6 mg/dL (ref 8.9–10.3)
Chloride: 96 mmol/L — ABNORMAL LOW (ref 98–111)
Creatinine, Ser: 9.43 mg/dL — ABNORMAL HIGH (ref 0.61–1.24)
GFR, Estimated: 6 mL/min — ABNORMAL LOW (ref >60)
Glucose, Bld: 142 mg/dL — ABNORMAL HIGH (ref 70–99)
Phosphorus: 7.9 mg/dL — ABNORMAL HIGH (ref 2.5–4.6)
Potassium: 4.9 mmol/L (ref 3.5–5.1)
Sodium: 136 mmol/L (ref 135–145)

## 2021-03-22 LAB — CBC
HCT: 33.7 % — ABNORMAL LOW (ref 39.0–52.0)
Hemoglobin: 10.9 g/dL — ABNORMAL LOW (ref 13.0–17.0)
MCH: 30.9 pg (ref 26.0–34.0)
MCHC: 32.3 g/dL (ref 30.0–36.0)
MCV: 95.5 fL (ref 80.0–100.0)
Platelets: 122 10*3/uL — ABNORMAL LOW (ref 150–400)
RBC: 3.53 MIL/uL — ABNORMAL LOW (ref 4.22–5.81)
RDW: 15.4 % (ref 11.5–15.5)
WBC: 7.8 10*3/uL (ref 4.0–10.5)
nRBC: 0 % (ref 0.0–0.2)

## 2021-03-22 MED ORDER — CALCIUM CARBONATE ANTACID 500 MG PO CHEW
1.0000 | CHEWABLE_TABLET | Freq: Three times a day (TID) | ORAL | Status: DC | PRN
  Administered 2021-03-22: 200 mg via ORAL
  Filled 2021-03-22: qty 1

## 2021-03-22 MED ORDER — FAMOTIDINE 20 MG PO TABS
20.0000 mg | ORAL_TABLET | Freq: Every day | ORAL | Status: DC
  Administered 2021-03-22 – 2021-03-23 (×2): 20 mg via ORAL
  Filled 2021-03-22 (×2): qty 1

## 2021-03-22 NOTE — Progress Notes (Signed)
Speech Language Pathology Treatment: Dysphagia  Patient Details Name: Jose Garrett. MRN: [id number] DOB: [birth year] Today's Date: 03/22/2021 Time: 2025-4270 SLP Time Calculation (min) (ACUTE ONLY): 20 min  Assessment / Plan / Recommendation Clinical Impression  Pt seen while eating lunch meal. He is happy with DYs 2 texture, especially when gravy or sauce is added. Pt is eating solids well, but is still coughing after ever sip of nectar thick liquids. Pt is trying to follow bites with sips, but needs cues for throat clearing and second swallow. He subjectively feels improved after steroids. Hoping that he will continue to improve into tomorrow. If he is still consistently coughing with nectar will f/u with MBS prior to d/c to have more objective assessment for SLP at facility. Pt agrees. He also reports severe reflux at night, regurgitating acid and coughing it up into the bed. Felt like he was choking. Pts positioning is poor in bed due to pain. Suggested night time H2 blocker to MD.    HPI HPI: 71 year old male who presented to the hospital with increased weakness of bilateral arms, hands, fingers as well as wheelchair-bound for 3 weeks.  Patient  unable to lift his arms above the shoulder which was completely new.  Imaging showed subacute spinal cord compression.   Underwent surgery on 03/17/2021: C3-4, C4-5, C5-6 ACDF by Dr. Kathyrn Sheriff on 03/17/2021. Pt reports dysphagia. PMH: amyloidosis, chronic combined CHF, ESRD, CAD, peripheral vascular disease status post right BKA, severe AS status post TAVR.      SLP Plan         Recommendations for follow up therapy are one component of a multi-disciplinary discharge planning process, led by the attending physician.  Recommendations may be updated based on patient status, additional functional criteria and insurance authorization.    Recommendations  Medication Administration: Crushed with puree Supervision: Patient able to self  feed Compensations: Slow rate;Small sips/bites;Multiple dry swallows after each bite/sip;Follow solids with liquid;Clear throat intermittently                Oral Care Recommendations: Oral care BID Follow Up Recommendations: Skilled nursing-short term rehab (<3 hours/day) Assistance recommended at discharge: Intermittent Supervision/Assistance SLP Visit Diagnosis: Dysphagia, oropharyngeal phase (R13.12)           [name], Katherene Ponto  03/22/2021, 1:56 PM

## 2021-03-22 NOTE — Progress Notes (Signed)
Contacted [facility] to see if pt's home clinic re-clipped pt to SW for HD while at snf. Will await response.   Melven Sartorius Renal Navigator [phone]

## 2021-03-22 NOTE — Progress Notes (Signed)
Nephrology Progress Note:   Patient ID: Jose Greth., male   DOB: [birth year], 71 y.o.   MRN: [id number]  Subjective: no c/o, up on side of bed. Hands slowly improving in strength  Physical exam:  Gen: adult male in NAD HEENT in a cervical collar CVS: S1S2 no rub Resp:CTA and normal wob Abd: soft, NT/nd Ext: R bka, no edema Neuro: alert and oriented x 3 Dialysis access: R AVF +t/b  OP HD: MWF [facility]  4h  400/1.5  71.9kg  2/2 bath  Hep 2400  RUA AVF Mircera 75 mcg q4wks - last 02/15/21 Calcitriol 33mcg PO qHD-last 02/27/21      Assessment/Plan: SP C3- C6 ACDF - by Dr Kathyrn Sheriff on 03/17/21 for severe cervical spinal stenosis w/ bilat UE numbness and weakness  ESRD - MWF HD. Next HD today.  HTN /volume- No vol excess on exam, 3- 4 kg under, losing body wt here. Usually does not have volume issues. We started hydralazine at 25 mg tid here for HTN. Low UF goal 1 L.  Anemia of CKD - pause ESA - had been on aranesp 100 mcg every Monday and last Hb 11.4. anticipate will need to resume lower dose here Secondary Hyperparathyroidism - hyperphos; improved on lanthanum. on calcitriol  Dispo - per primary team  [name], MD 03/22/2021, 11:19 AM     Studies/Results: No results found.  (feeding supplement) PROSource Plus  30 mL Oral TID BM   amiodarone  200 mg Oral Daily   amLODipine  10 mg Oral Daily   atorvastatin  80 mg Oral Q supper   calcitRIOL  1 mcg Oral Q M,W,F-HD   Chlorhexidine Gluconate Cloth  6 each Topical Q0600   Chlorhexidine Gluconate Cloth  6 each Topical Q0600   dexamethasone (DECADRON) injection  4 mg Intravenous Q6H   docusate sodium  100 mg Oral BID   feeding supplement (NEPRO CARB STEADY)  237 mL Oral TID BM   heparin  5,000 Units Subcutaneous Q12H   hydrALAZINE  50 mg Oral Q8H   lanthanum  2,000 mg Oral TID WC   pantoprazole sodium  40 mg Oral BID   polyethylene glycol  17 g Oral Daily   senna-docusate  2 tablet Oral BID   sodium chloride flush  3 mL  Intravenous Q12H   vitamin B-12  1,000 mcg Oral QPM

## 2021-03-22 NOTE — Progress Notes (Signed)
PROGRESS NOTE  Jose Garrett.  DOB: Dec 07, 1950  PCP: [name], NP DEY:[id number]  DOA: 02/27/2021  LOS: 10 days  Hospital Day: 24  Chief Complaint  Patient presents with   Weakness    Brief narrative: Jose Oehlert. is a 71 y.o. male with PMH significant for amyloidosis, chronic combined CHF, ESRD, CAD, peripheral vascular disease status post right BKA, severe AS status post TAVR. Patient presented to the hospital on 02/27/2021 with complaint of increased weakness of bilateral arms, hands, fingers.  He was unable to lift his arms above the shoulder and had been wheelchair-bound for the past 3 weeks.  Imaging in the ED showed subacute spinal cord compression.   Neurosurgery was consulted. Admitted to hospital service Patient took some time to come to a decision regarding surgery.  Underwent surgery on 03/17/2021 after 2 weeks of initial presentation. See below for details.   Subjective: Patient was seen and examined this morning.  Pleasant elderly Caucasian male.  Lying on bed.  Not in distress.  He states that he gets nightly reflux symptoms.  Assessment/Plan: Spinal cord compression - POA -Presented with increased weakness of both arms, hands fingers, wheelchair-bound status for 3 weeks.   -MRI on admission showed advanced degenerative changes resulting in severe spinal canal stenosis with cord compression from C3-4 through C5-6.   -Neurosurgery was consulted.  Patient underwent ACDF C3-4, C4-5 C5-6 on 03/17/2021. -Neurosurgery follow-up appreciated.   Dysphagia -Postoperatively, patient has been experiencing significant dysphagia.  Seen by speech therapist.  Per neurology follow-up note from 1/10, patient has been initiated on dexamethasone short course.  Currently on 4 mg every 6 hours.   -Speech therapy follow-up appreciated.  Noted a plan for MBS tomorrow.    GERD -Complains of nightly reflux.  Remains on Protonix along with steroids.  As he continues to have  nightly reflux symptoms, I will start him on Pepcid nightly.     ESRD HD MWF -Per nephrology   Essential hypertension -Continue amlodipine and hydralazine.  -Continue to monitor   Thrombocytopenia -Slightly low but stable.  Anemia of chronic disease -Hemoglobin stable.   Coronary artery disease involving native coronary artery of native heart without angina pectoris - POA -Most recent cath was done in May 2020 which showed severe single-vessel CAD with chronic total occlusion of the RCA, with collaterals.  Mild calcific nonobstructive LCA disease.   -Continue statin. -Plavix held on 03/11/2021 in preparation for neurosurgery on 03/17/2021. -Discussed with Dr. Arnoldo Morale regarding resumption of Plavix.  He mentioned that it would be okay to resume after 1/8.  However it has not been resumed yet because of dysphagia.  Once his dysphagia improved, will resume Plavix.   PAD - POA -status post BKA on the right.  He has a femoropopliteal bypass graft on the left.   Chronic combined systolic and diastolic CHF - POA -Most recent 2D echo in August 2021 showed an EF of 45-50%, mild decreased LV function with global hypokinesis.  There was also grade 1 diastolic dysfunction.  RV was mildly reduced as well.  Volume managed by dialysis.   S/P TAVR (transcatheter aortic valve replacement) Stable.  Mobility: PT eval obtained.  SNF recommended Living condition: Was living at home Goals of care:   Code Status: Full Code  Nutritional status: Body mass index is 21.57 kg/m.      Diet:  Diet Order             DIET DYS 2 Room service  appropriate? Yes with Assist; Fluid consistency: Nectar Thick  Diet effective now                  DVT prophylaxis:  SCD's Start: 03/17/21 1454 heparin injection 5,000 Units Start: 02/28/21 1300   Antimicrobials: None Fluid: None Consultants: Nephrology, neurosurgery Family Communication: None at bedside  Status is: Inpatient  Continue in-hospital care  because: Pending placement, dysphagia improving Level of care: Med-Surg   Dispo: The patient is from: Home              Anticipated d/c is to: SNF              Patient currently is not medically stable to d/c.   Difficult to place patient No     Infusions:   sodium chloride Stopped (03/22/21 0720)    Scheduled Meds:  (feeding supplement) PROSource Plus  30 mL Oral TID BM   amiodarone  200 mg Oral Daily   amLODipine  10 mg Oral Daily   atorvastatin  80 mg Oral Q supper   calcitRIOL  1 mcg Oral Q M,W,F-HD   Chlorhexidine Gluconate Cloth  6 each Topical Q0600   Chlorhexidine Gluconate Cloth  6 each Topical Q0600   dexamethasone (DECADRON) injection  4 mg Intravenous Q6H   docusate sodium  100 mg Oral BID   famotidine  20 mg Oral QHS   feeding supplement (NEPRO CARB STEADY)  237 mL Oral TID BM   heparin  5,000 Units Subcutaneous Q12H   hydrALAZINE  50 mg Oral Q8H   lanthanum  2,000 mg Oral TID WC   pantoprazole sodium  40 mg Oral BID   polyethylene glycol  17 g Oral Daily   senna-docusate  2 tablet Oral BID   sodium chloride flush  3 mL Intravenous Q12H   vitamin B-12  1,000 mcg Oral QPM    PRN meds: acetaminophen, albuterol, alum & mag hydroxide-simeth, bisacodyl, calcium carbonate, hydrALAZINE, loperamide, LORazepam, menthol-cetylpyridinium **OR** phenol, ondansetron (ZOFRAN) IV, ondansetron, oxyCODONE, sodium chloride flush   Antimicrobials: Anti-infectives (From admission, onward)    Start     Dose/Rate Route Frequency Ordered Stop   03/18/21 1000  ceFAZolin (ANCEF) IVPB 2g/100 mL premix        2 g 200 mL/hr over 30 Minutes Intravenous  Once 03/17/21 1454 03/18/21 1351   03/17/21 0815  ceFAZolin (ANCEF) IVPB 2g/100 mL premix        2 g 200 mL/hr over 30 Minutes Intravenous On call to O.R. 03/17/21 0806 03/17/21 0940   03/17/21 0747  ceFAZolin (ANCEF) 2-4 GM/100ML-% IVPB       Note to Pharmacy: Gregery Na H: cabinet override      03/17/21 0747 03/17/21 0932        Objective: Vitals:   03/22/21 0727 03/22/21 1211  BP: (!) 148/78 109/85  Pulse: 73 72  Resp: (!) 22 18  Temp: 98 F (36.7 C) 98.3 F (36.8 C)  SpO2: 94% 96%    Intake/Output Summary (Last 24 hours) at 03/22/2021 1439 Last data filed at 03/21/2021 2129 Gross per 24 hour  Intake 3 ml  Output --  Net 3 ml   Filed Weights   03/18/21 1215 03/20/21 0827 03/20/21 1045  Weight: 71.2 kg 68.5 kg 68.2 kg   Weight change:  Body mass index is 21.57 kg/m.   Physical Exam: General exam: Pleasant, elderly Caucasian male.  Not in physical distress Skin: No rashes, lesions or ulcers. HEENT: Atraumatic, normocephalic, no obvious  bleeding Lungs: Clear to auscultate bilaterally CVS: Regular rate and rhythm, no murmur GI/Abd soft, nontender, nondistended, bowel sound present CNS: Alert, awake, oriented x3 Psychiatry: Mood appropriate Extremities: No pedal edema on the left, right BKA status  Data Review: I have personally reviewed the laboratory data and studies available.  F/u labs ordered Unresulted Labs (From admission, onward)     Start     Ordered   03/17/21 0500  Renal function panel  Daily,   R     Question:  Specimen collection method  Answer:  Lab=Lab collect   03/16/21 0741            Signed, Terrilee Croak, MD [facility] 03/22/2021

## 2021-03-22 NOTE — Progress Notes (Signed)
Hemodialysis treatment modified to 03/23/21 per MD [place]. Notified Theodoro Grist.

## 2021-03-22 NOTE — Progress Notes (Signed)
Wallace Cullens, hemodialysis RN, notified patient's nurse, me, that patient's dialysis will be in the morning of 03/23/21 per MD.

## 2021-03-23 ENCOUNTER — Ambulatory Visit: Payer: Medicare Other | Admitting: Occupational Therapy

## 2021-03-23 ENCOUNTER — Inpatient Hospital Stay (HOSPITAL_COMMUNITY): Payer: Medicare Other

## 2021-03-23 LAB — RENAL FUNCTION PANEL
Albumin: 3.6 g/dL (ref 3.5–5.0)
Anion gap: 20 — ABNORMAL HIGH (ref 5–15)
BUN: 89 mg/dL — ABNORMAL HIGH (ref 8–23)
CO2: 23 mmol/L (ref 22–32)
Calcium: 10.3 mg/dL (ref 8.9–10.3)
Chloride: 92 mmol/L — ABNORMAL LOW (ref 98–111)
Creatinine, Ser: 10.98 mg/dL — ABNORMAL HIGH (ref 0.61–1.24)
GFR, Estimated: 5 mL/min — ABNORMAL LOW (ref >60)
Glucose, Bld: 134 mg/dL — ABNORMAL HIGH (ref 70–99)
Phosphorus: 7.7 mg/dL — ABNORMAL HIGH (ref 2.5–4.6)
Potassium: 5.3 mmol/L — ABNORMAL HIGH (ref 3.5–5.1)
Sodium: 135 mmol/L (ref 135–145)

## 2021-03-23 LAB — RESP PANEL BY RT-PCR (FLU A&B, COVID) ARPGX2
Influenza A by PCR: NEGATIVE
Influenza B by PCR: NEGATIVE
SARS Coronavirus 2 by RT PCR: NEGATIVE

## 2021-03-23 NOTE — Progress Notes (Signed)
Contacted by CSW regarding pt's d/c tomorrow. Pt will be going to [facility] at d/c. Contacted [facility] yesterday to confirm pt re-clipped to clinic while at snf. Response received from clinic. It appears clinic just needs start date. Contacted clinic this afternoon to advise them pt will d/c to snf tomorrow and start on Monday. Unable to reach appropriate staff member this afternoon. Will contact clinic first thing in the morning. Contacted inpt HD unit to request that pt be placed on 1st shift tomorrow so pt can d/c to snf after treatment.   Melven Sartorius Renal Navigator [phone]

## 2021-03-23 NOTE — Progress Notes (Signed)
OT Cancellation Note  Patient Details Name: Jose Garrett. MRN: [id number] DOB: [birth year]   Cancelled Treatment:    Reason Eval/Treat Not Completed: Patient at procedure or test/ unavailable. Medical transport present to take patient to HD. OT to check back as time allows.  Jose Garrett OTR/L Supplemental OT, Department of rehab services [phone]  Jose Belk R H. 03/23/2021, 8:50 AM

## 2021-03-23 NOTE — Progress Notes (Signed)
Modified Barium Swallow Progress Note  Patient Details  Name: Jose Garrett. MRN: [id number] Date of Birth: 03-31-1950  Today's Date: 03/23/2021  Modified Barium Swallow completed.  Full report located under Chart Review in the Imaging Section.  Brief recommendations include the following:  Clinical Impression  Pt demosntrates more significant dyspahgia than subjective appearance during the past two sessions. Pt with ongoing edema of posterior pharyngeal wall with reduced epiglottic deflection and UES opening leading to pharygeal residuals that squeeze into the vestibule during and after the swallow, most significantly with thin liquids. Pt does have sensation of aspiration but cough is not fully ejecting thin aspirate. A throat clear can eject nectar penetrate prior to aspiration, if followed by several second swallows. Residue with puree is moderate and requires a liquid wash to clear. Positional strategies could not be attempted as pt was in a hard cervical collar. Despite severity of dyphagia pt has been able to consume several meals with adequate intake and no adverse impact so far. Esopahgeal sweep did show retrograde flow and residue. Pt recommended to continue a dys 2/nectar thick diet for now though risk of aspiration and post prandial aspiration is present.   Swallow Evaluation Recommendations       SLP Diet Recommendations: Dysphagia 2 (Fine chop) solids;Nectar thick liquid   Liquid Administration via: Cup;Straw   Medication Administration: Crushed with puree   Supervision: Staff to assist with self feeding   Compensations: Slow rate;Small sips/bites;Follow solids with liquid;Clear throat after each swallow       Oral Care Recommendations: Oral care BID   Other Recommendations: Have oral suction available    Wednesday Ericsson, Katherene Ponto 03/23/2021,2:49 PM

## 2021-03-23 NOTE — TOC Progression Note (Signed)
Transition of Care (TOC) - Progression Note    Patient Details  Name: Jose Garrett. MRN: [id number] Date of Birth: 06/27/1950  Transition of Care [facility]) CM/SW Philip,  Phone Number: 03/23/2021, 4:01 PM  Clinical Narrative:   CSW submitted for insurance authorization request for patient to admit to [facility] possibly tomorrow. CSW also spoke with [facility] for palliative care follow up, and they will make sure to see patient after discharge. CSW following.    Expected Discharge Plan: Skilled Nursing Facility Barriers to Discharge: Continued Medical Work up, Ship broker  Expected Discharge Plan and Services Expected Discharge Plan: [place] Choice: [place] arrangements for the past 2 months: Single Family Home                                       Social Determinants of Health (SDOH) Interventions    Readmission Risk Interventions Readmission Risk Prevention Plan 04/15/2019 10/23/2018 08/20/2018  Transportation Screening Complete Complete -  PCP or Specialist Appt within 3-5 Days - Complete -  HRI or [place] - Complete -  Social Work Consult for [place] Planning/Counseling - Complete -  Palliative Care Screening - Not Applicable -  Medication Review Press photographer) Complete Complete -  PCP or Specialist appointment within 3-5 days of discharge Complete - Complete  HRI or Home Care Consult Complete - Complete  SW Recovery Care/Counseling Consult Complete - Complete  Palliative Care Screening Not Applicable - Not [place] Patient Refused - Not Applicable  Some recent data might be hidden

## 2021-03-23 NOTE — Progress Notes (Signed)
Jose Garrett Progress Note   Subjective:  Seen on HD off schedule. No C/Os. Minimal UF today.    Objective Vitals:   03/23/21 0743 03/23/21 0902 03/23/21 0920 03/23/21 0930  BP: (!) 171/56 (!) 174/68 (!) 174/61 (!) 158/70  Pulse: 76 84 83 73  Resp: 18 (!) 21 19 18   Temp: 98 F (36.7 C) 97.7 F (36.5 C)    TempSrc:  Temporal    SpO2: 91% 91% 93% 91%  Weight:  68.3 kg    Height:       Gen: adult male in NAD HEENT in a cervical collar CVS: H7D4 2/6 systolic M. No R/G. SR on monitor.  Resp:CTA and normal wob Abd: NABS, NT, ND Ext: R bka no stump edema, no edema Neuro: alert and oriented x 3 Dialysis access: R AVF +t/b   Additional Objective Labs: Basic Metabolic Panel: Recent Labs  Lab 03/21/21 0147 03/22/21 0432 03/23/21 0227  NA 136 136 135  K 4.0 4.9 5.3*  CL 95* 96* 92*  CO2 26 22 23   GLUCOSE 89 142* 134*  BUN 36* 60* 89*  CREATININE 7.07* 9.43* 10.98*  CALCIUM 9.4 9.6 10.3  PHOS 6.1* 7.9* 7.7*   Liver Function Tests: Recent Labs  Lab 03/21/21 0147 03/22/21 0432 03/23/21 0227  ALBUMIN 3.3* 3.3* 3.6   No results for input(s): LIPASE, AMYLASE in the last 168 hours. CBC: Recent Labs  Lab 03/17/21 0033 03/19/21 0144 03/22/21 0432  WBC 4.9 7.4 7.8  HGB 10.8* 11.4* 10.9*  HCT 33.7* 34.7* 33.7*  MCV 97.7 97.5 95.5  PLT 118* 129* 122*   Blood Culture    Component Value Date/Time   SDES WOUND LEFT WRIST 11/02/2020 1424   SPECREQUEST MASS 11/02/2020 1424   CULT  11/02/2020 1424    RARE NORMAL SKIN FLORA NO ANAEROBES ISOLATED Performed at [facility], [place] [address] [place], [place] [address]    REPTSTATUS 11/07/2020 FINAL 11/02/2020 1424    Cardiac Enzymes: No results for input(s): CKTOTAL, CKMB, CKMBINDEX, TROPONINI in the last 168 hours. CBG: No results for input(s): GLUCAP in the last 168 hours. Iron Studies: No results for input(s): IRON, TIBC, TRANSFERRIN, FERRITIN in the last 72  hours. @lablastinr3 @ Studies/Results: No results found. Medications:  sodium chloride Stopped (03/22/21 0720)    (feeding supplement) PROSource Plus  30 mL Oral TID BM   amiodarone  200 mg Oral Daily   amLODipine  10 mg Oral Daily   atorvastatin  80 mg Oral Q supper   calcitRIOL  1 mcg Oral Q M,W,F-HD   Chlorhexidine Gluconate Cloth  6 each Topical Q0600   dexamethasone (DECADRON) injection  4 mg Intravenous Q6H   docusate sodium  100 mg Oral BID   famotidine  20 mg Oral QHS   feeding supplement (NEPRO CARB STEADY)  237 mL Oral TID BM   heparin  5,000 Units Subcutaneous Q12H   hydrALAZINE  50 mg Oral Q8H   lanthanum  2,000 mg Oral TID WC   pantoprazole sodium  40 mg Oral BID   polyethylene glycol  17 g Oral Daily   senna-docusate  2 tablet Oral BID   sodium chloride flush  3 mL Intravenous Q12H   vitamin B-12  1,000 mcg Oral QPM     OP HD: MWF [facility]  4h  400/1.5  71.9kg  2/2 bath  Hep 2400  RUA AVF Mircera 75 mcg q4wks - last 02/15/21 Calcitriol 79mcg PO qHD-last 02/27/21  Assessment/Plan: SP C3- C6 ACDF - by Dr Kathyrn Sheriff on 03/17/21 for severe cervical spinal stenosis w/ bilat UE numbness and weakness  ESRD - MWF HD. HD off schedule D/T staffing and scheduling issues.  Next HD 03/24/2021 to resume MWF schedule. K+ 5.3 on 2.0 K bath. No heparin.  HTN /volume- No vol excess on exam, 3- 4 kg under, losing body wt here. Usually does not have volume issues. We started hydralazine at 25 mg tid here for HTN. Low UF goal 1 L.  Anemia of CKD - pause ESA - had been on aranesp 100 mcg every Monday and last Hb 11.4. anticipate will need to resume lower dose here Secondary Hyperparathyroidism - hyperphos; improved on lanthanum. on calcitriol  Dispo - per primary team  Jose Mcmanaway H. Brighton Pilley NP-C 03/23/2021, 9:44 AM  [employer] [phone]

## 2021-03-23 NOTE — Progress Notes (Signed)
Speech Language Pathology Treatment: Dysphagia  Patient Details Name: Jose Garrett. MRN: [id number] DOB: [birth year] Today's Date: 03/23/2021 Time: 7672-0947 SLP Time Calculation (min) (ACUTE ONLY): 15 min  Assessment / Plan / Recommendation Clinical Impression  Pt seen at the end of his am meal just before dialysis. He was coughing on SLP arrival. Reports ongoing globus sensation and struggle with ground meat. Says if he takes small sips of water he does well, which he demonstrated without coughing. Pt also reports vomiting last night though he was given an H2 blocker. Will f/u with MBS prior to potential d/c given ongoing dysphagia.   HPI HPI: 71 year old male who presented to the hospital with increased weakness of bilateral arms, hands, fingers as well as wheelchair-bound for 3 weeks.  Patient  unable to lift his arms above the shoulder which was completely new.  Imaging showed subacute spinal cord compression.   Underwent surgery on 03/17/2021: C3-4, C4-5, C5-6 ACDF by Dr. Kathyrn Sheriff on 03/17/2021. Pt reports dysphagia. PMH: amyloidosis, chronic combined CHF, ESRD, CAD, peripheral vascular disease status post right BKA, severe AS status post TAVR.      SLP Plan  MBS      Recommendations for follow up therapy are one component of a multi-disciplinary discharge planning process, led by the attending physician.  Recommendations may be updated based on patient status, additional functional criteria and insurance authorization.    Recommendations  Medication Administration: Crushed with puree Supervision: Patient able to self feed Compensations: Slow rate;Small sips/bites;Multiple dry swallows after each bite/sip;Follow solids with liquid;Clear throat intermittently                Oral Care Recommendations: Oral care BID Follow Up Recommendations: Skilled nursing-short term rehab (<3 hours/day) Assistance recommended at discharge: Intermittent Supervision/Assistance SLP Visit  Diagnosis: Dysphagia, oropharyngeal phase (R13.12) Plan: MBS           Chavie Kolinski, Katherene Ponto  03/23/2021, 10:21 AM

## 2021-03-23 NOTE — Progress Notes (Signed)
PT Cancellation Note  Patient Details Name: Jose Garrett. MRN: [id number] DOB: 08/29/1950   Cancelled Treatment:    Reason Eval/Treat Not Completed: Patient at procedure or test/unavailable. Pt in HD. Will continue to check on.   Shary Decamp [facility] 03/23/2021, 11:21 AM [place] Pager [phone] Office [phone]

## 2021-03-23 NOTE — Progress Notes (Signed)
PROGRESS NOTE  Jose Garrett.  DOB: [birth year]  PCP: [name], NP MVH:[id number]  DOA: 02/27/2021  LOS: 23 days  Hospital Day: 25  Chief Complaint  Patient presents with   Weakness    Brief narrative: Jose Garrett. is a 71 y.o. male with PMH significant for amyloidosis, chronic combined CHF, ESRD, CAD, peripheral vascular disease status post right BKA, severe AS status post TAVR. Patient presented to the hospital on 02/27/2021 with complaint of increased weakness of bilateral arms, hands, fingers.  He was unable to lift his arms above the shoulder and had been wheelchair-bound for the past 3 weeks.  Imaging in the ED showed subacute spinal cord compression.   Neurosurgery was consulted. Admitted to hospital service Patient took some time to come to a decision regarding surgery.  Underwent surgery on 03/17/2021 after 2 weeks of initial presentation. See below for details.  Subjective: Patient was seen and examined this morning in dialysis unit. Not in distress.  He states that he still had acid reflux last night.  Continues to be dysphasic.  Pending modified barium swallow.  Assessment/Plan: Spinal cord compression - POA -Presented with increased weakness of both arms, hands fingers, wheelchair-bound status for 3 weeks.   -MRI on admission showed advanced degenerative changes resulting in severe spinal canal stenosis with cord compression from C3-4 through C5-6.   -Neurosurgery was consulted.  Patient underwent ACDF C3-4, C4-5 C5-6 on 03/17/2021.   Dysphagia -Postoperatively, patient has been experiencing significant dysphagia.  Seen by speech therapist.  Per neurology follow-up note from 1/10, patient has been initiated on dexamethasone short course.  Currently on 4 mg every 6 hours.   -Speech therapy follow-up appreciated.  Noted a plan for MBS today.  GERD -Continues to have nightly reflux.  Was started on Pepcid nightly yesterday.  Also remains on Protonix along with  steroids.     ESRD HD MWF -Per nephrology   Essential hypertension -Continue amlodipine and hydralazine.  -Continue to monitor   Thrombocytopenia -Slightly low but stable.  Anemia of chronic disease -Hemoglobin stable.   Coronary artery disease involving native coronary artery of native heart without angina pectoris - POA -Most recent cath was done in May 2020 which showed severe single-vessel CAD with chronic total occlusion of the RCA, with collaterals.  Mild calcific nonobstructive LCA disease.   -Continue statin. -Plavix held on 03/11/2021 in preparation for neurosurgery on 03/17/2021. -Previous hospitalist discussed with Dr. Arnoldo Morale regarding resumption of Plavix.  He mentioned that it would be okay to resume after 1/8.  However it has not been resumed yet because of dysphagia.  Once his dysphagia improved, will resume Plavix.   PAD - POA -status post BKA on the right.  He has a femoropopliteal bypass graft on the left.   Chronic combined systolic and diastolic CHF - POA -Most recent 2D echo in August 2021 showed an EF of 45-50%, mild decreased LV function with global hypokinesis.  There was also grade 1 diastolic dysfunction.  RV was mildly reduced as well.  Volume managed by dialysis.   S/P TAVR (transcatheter aortic valve replacement) Stable.  Mobility: PT eval obtained.  SNF recommended Living condition: Was living at home Goals of care:   Code Status: Full Code  Nutritional status: Body mass index is 21.61 kg/m.      Diet:  Diet Order             DIET DYS 2 Room service appropriate? Yes with Assist; Fluid consistency:  Nectar Thick  Diet effective now                  DVT prophylaxis:  SCD's Start: 03/17/21 1454 heparin injection 5,000 Units Start: 02/28/21 1300   Antimicrobials: None Fluid: None Consultants: Nephrology, neurosurgery Family Communication: None at bedside  Status is: Inpatient  Continue in-hospital care because: Pending placement,  dysphagia improving Level of care: Med-Surg   Dispo: The patient is from: Home              Anticipated d/c is to: SNF              Patient currently is not medically stable to d/c.   Difficult to place patient No     Infusions:   sodium chloride Stopped (03/22/21 0720)    Scheduled Meds:  (feeding supplement) PROSource Plus  30 mL Oral TID BM   amiodarone  200 mg Oral Daily   amLODipine  10 mg Oral Daily   atorvastatin  80 mg Oral Q supper   calcitRIOL  1 mcg Oral Q M,W,F-HD   Chlorhexidine Gluconate Cloth  6 each Topical Q0600   dexamethasone (DECADRON) injection  4 mg Intravenous Q6H   docusate sodium  100 mg Oral BID   famotidine  20 mg Oral QHS   feeding supplement (NEPRO CARB STEADY)  237 mL Oral TID BM   heparin  5,000 Units Subcutaneous Q12H   hydrALAZINE  50 mg Oral Q8H   lanthanum  2,000 mg Oral TID WC   pantoprazole sodium  40 mg Oral BID   polyethylene glycol  17 g Oral Daily   senna-docusate  2 tablet Oral BID   sodium chloride flush  3 mL Intravenous Q12H   vitamin B-12  1,000 mcg Oral QPM    PRN meds: acetaminophen, albuterol, alum & mag hydroxide-simeth, bisacodyl, calcium carbonate, hydrALAZINE, loperamide, LORazepam, menthol-cetylpyridinium **OR** phenol, ondansetron (ZOFRAN) IV, ondansetron, oxyCODONE, sodium chloride flush   Antimicrobials: Anti-infectives (From admission, onward)    Start     Dose/Rate Route Frequency Ordered Stop   03/18/21 1000  ceFAZolin (ANCEF) IVPB 2g/100 mL premix        2 g 200 mL/hr over 30 Minutes Intravenous  Once 03/17/21 1454 03/18/21 1351   03/17/21 0815  ceFAZolin (ANCEF) IVPB 2g/100 mL premix        2 g 200 mL/hr over 30 Minutes Intravenous On call to O.R. 03/17/21 0806 03/17/21 0940   03/17/21 0747  ceFAZolin (ANCEF) 2-4 GM/100ML-% IVPB       Note to Pharmacy: Gregery Na H: cabinet override      03/17/21 0747 03/17/21 0932       Objective: Vitals:   03/23/21 1200 03/23/21 1205  BP: (!) 150/57 (!)  151/102  Pulse: 74 75  Resp: 16 16  Temp:    SpO2:  95%    Intake/Output Summary (Last 24 hours) at 03/23/2021 1246 Last data filed at 03/23/2021 1205 Gross per 24 hour  Intake 717 ml  Output 1000 ml  Net -283 ml    Filed Weights   03/20/21 0827 03/20/21 1045 03/23/21 0902  Weight: 68.5 kg 68.2 kg 68.3 kg   Weight change:  Body mass index is 21.61 kg/m.   Physical Exam: General exam: Pleasant, elderly Caucasian male.  Not in physical distress Skin: No rashes, lesions or ulcers. HEENT: Atraumatic, normocephalic, no obvious bleeding Lungs: Clear to auscultation bilaterally CVS: Regular rate and rhythm, no murmur GI/Abd soft, nontender, nondistended, bowel sound present  CNS: Alert, awake, oriented x3 Psychiatry: Mood appropriate Extremities: trace pedal edema on the left, right BKA status  Data Review: I have personally reviewed the laboratory data and studies available.  F/u labs ordered Unresulted Labs (From admission, onward)     Start     Ordered   03/17/21 0500  Renal function panel  Daily,   R     Question:  Specimen collection method  Answer:  Lab=Lab collect   03/16/21 0741   Signed and Held  Renal function panel  Once,   R       Question:  Specimen collection method  Answer:  Lab=Lab collect   Signed and Held   Signed and Held  CBC  Once,   R       Question:  Specimen collection method  Answer:  Lab=Lab collect   Signed and Held            Signed, Terrilee Croak, MD [facility] 03/23/2021

## 2021-03-24 LAB — RENAL FUNCTION PANEL
Albumin: 3.2 g/dL — ABNORMAL LOW (ref 3.5–5.0)
Anion gap: 13 (ref 5–15)
BUN: 74 mg/dL — ABNORMAL HIGH (ref 8–23)
CO2: 25 mmol/L (ref 22–32)
Calcium: 9.5 mg/dL (ref 8.9–10.3)
Chloride: 96 mmol/L — ABNORMAL LOW (ref 98–111)
Creatinine, Ser: 8.05 mg/dL — ABNORMAL HIGH (ref 0.61–1.24)
GFR, Estimated: 7 mL/min — ABNORMAL LOW (ref >60)
Glucose, Bld: 131 mg/dL — ABNORMAL HIGH (ref 70–99)
Phosphorus: 6 mg/dL — ABNORMAL HIGH (ref 2.5–4.6)
Potassium: 5.1 mmol/L (ref 3.5–5.1)
Sodium: 134 mmol/L — ABNORMAL LOW (ref 135–145)

## 2021-03-24 MED ORDER — OXYCODONE-ACETAMINOPHEN 5-325 MG PO TABS
1.0000 | ORAL_TABLET | Freq: Four times a day (QID) | ORAL | 0 refills | Status: AC | PRN
Start: 2021-03-24 — End: 2021-03-27

## 2021-03-24 MED ORDER — CALCIUM CARBONATE ANTACID 500 MG PO CHEW: 1.0000 | CHEWABLE_TABLET | Freq: Three times a day (TID) | ORAL | Status: AC | PRN

## 2021-03-24 MED ORDER — CALCITRIOL 0.5 MCG PO CAPS: 1.0000 ug | ORAL_CAPSULE | ORAL | Status: AC

## 2021-03-24 MED ORDER — FAMOTIDINE 20 MG PO TABS: 20.0000 mg | ORAL_TABLET | Freq: Every day | ORAL | Status: AC

## 2021-03-24 MED ORDER — PROSOURCE PLUS PO LIQD: 30.0000 mL | Freq: Three times a day (TID) | ORAL | Status: AC

## 2021-03-24 MED ORDER — BISACODYL 10 MG RE SUPP: 10.0000 mg | Freq: Every day | RECTAL | 0 refills | Status: AC | PRN

## 2021-03-24 MED ORDER — NEPRO/CARBSTEADY PO LIQD: 237.0000 mL | Freq: Three times a day (TID) | ORAL | 0 refills | Status: AC

## 2021-03-24 MED ORDER — LOPERAMIDE HCL 2 MG PO CAPS: 2.0000 mg | ORAL_CAPSULE | ORAL | 0 refills | Status: AC | PRN

## 2021-03-24 MED ORDER — DEXAMETHASONE 4 MG PO TABS: 4.0000 mg | ORAL_TABLET | Freq: Four times a day (QID) | ORAL | 0 refills | Status: AC

## 2021-03-24 MED ORDER — SENNOSIDES-DOCUSATE SODIUM 8.6-50 MG PO TABS: 2.0000 | ORAL_TABLET | Freq: Two times a day (BID) | ORAL | Status: AC

## 2021-03-24 MED ORDER — POLYETHYLENE GLYCOL 3350 17 G PO PACK: 17.0000 g | PACK | Freq: Every day | ORAL | 0 refills | Status: AC

## 2021-03-24 NOTE — Progress Notes (Signed)
[facility] Progress Note   Subjective:  seen in room, no c/o    Objective Vitals:   03/24/21 0800 03/24/21 0830 03/24/21 0835 03/24/21 0919  BP: (!) 143/54 (!) 134/58 (!) 136/56 (!) 164/51  Pulse: 82 77 77 79  Resp: (!) 22 (!) 21 (!) 22 18  Temp:    98 F (36.7 C)  TempSrc:    Oral  SpO2: 96%  96% 99%  Weight: 68 kg     Height:       Gen: adult male in NAD HEENT in a cervical collar CVS: Z6X0 2/6 systolic M. No R/G. SR on monitor.  Resp:CTA and normal wob Abd: NABS, NT, ND Ext: R bka no stump edema, no edema Neuro: alert and oriented x 3 Dialysis access: R AVF +t/b   OP HD: MWF [facility]  4h  400/1.5  71.9kg  2/2 bath  Hep 2400  RUA AVF Mircera 75 mcg q4wks - last 02/15/21 Calcitriol 95mcg PO qHD-last 02/27/21     Assessment/Plan: SP C3- C6 ACDF - by Dr Kathyrn Sheriff on 03/17/21 for severe cervical spinal stenosis w/ bilat UE numbness and weakness  ESRD - MWF HD. Next HD today. No heparin.  HTN /volume- No vol excess on exam, 3- 4 kg under, losing body wt here. Usually does not have volume issues. We started hydralazine at 25 mg tid here for HTN. Low UF goal 1 L.  Anemia of CKD - pause ESA - had been on aranesp 100 mcg every Monday and last Hb 11.4. anticipate will need to resume lower dose here Secondary Hyperparathyroidism - hyperphos; improved on lanthanum. on calcitriol  Dispo - per primary team  [name], MD 03/24/2021, 12:37 PM     Basic Metabolic Panel: Recent Labs  Lab 03/22/21 0432 03/23/21 0227 03/24/21 0534  NA 136 135 134*  K 4.9 5.3* 5.1  CL 96* 92* 96*  CO2 22 23 25   GLUCOSE 142* 134* 131*  BUN 60* 89* 74*  CREATININE 9.43* 10.98* 8.05*  CALCIUM 9.6 10.3 9.5  PHOS 7.9* 7.7* 6.0*    Liver Function Tests: Recent Labs  Lab 03/22/21 0432 03/23/21 0227 03/24/21 0534  ALBUMIN 3.3* 3.6 3.2*    No results for input(s): LIPASE, AMYLASE in the last 168 hours. CBC: Recent Labs  Lab 03/19/21 0144 03/22/21 0432  WBC 7.4 7.8  HGB  11.4* 10.9*  HCT 34.7* 33.7*  MCV 97.5 95.5  PLT 129* 122*    Blood Culture    Component Value Date/Time   SDES WOUND LEFT WRIST 11/02/2020 1424   SPECREQUEST MASS 11/02/2020 1424   CULT  11/02/2020 1424    RARE NORMAL SKIN FLORA NO ANAEROBES ISOLATED Performed at [facility], [place] [address] [place], [place] [address]    REPTSTATUS 11/07/2020 FINAL 11/02/2020 1424      Medications:  sodium chloride Stopped (03/22/21 0720)    (feeding supplement) PROSource Plus  30 mL Oral TID BM   amiodarone  200 mg Oral Daily   amLODipine  10 mg Oral Daily   atorvastatin  80 mg Oral Q supper   calcitRIOL  1 mcg Oral Q M,W,F-HD   Chlorhexidine Gluconate Cloth  6 each Topical Q0600   dexamethasone (DECADRON) injection  4 mg Intravenous Q6H   docusate sodium  100 mg Oral BID   famotidine  20 mg Oral QHS   feeding supplement (NEPRO CARB STEADY)  237 mL Oral TID BM   heparin  5,000 Units Subcutaneous Q12H  hydrALAZINE  50 mg Oral Q8H   lanthanum  2,000 mg Oral TID WC   pantoprazole sodium  40 mg Oral BID   polyethylene glycol  17 g Oral Daily   senna-docusate  2 tablet Oral BID   sodium chloride flush  3 mL Intravenous Q12H   vitamin B-12  1,000 mcg Oral QPM

## 2021-03-24 NOTE — TOC Transition Note (Signed)
Transition of Care [facility]) - CM/SW Discharge Note   Patient Details  Name: Jose Garrett. MRN: [id number] Date of Birth: 12-Mar-1951  Transition of Care [facility]) CM/SW Contact:  Coralee Pesa, [place] Phone Number: 03/24/2021, 12:02 PM   Clinical Narrative:    Pt to be transported to [facility] via [place]. Nurse to call report to [phone]. Rm# [phone].   Final next level of care: Skilled Nursing Facility Barriers to Discharge: Barriers Resolved   Patient Goals and CMS Choice Patient states their goals for this hospitalization and ongoing recovery are:: to get rehab and get better CMS Medicare.gov Compare Post Acute Care list provided to:: Patient Choice offered to / list presented to : Patient, Adult Children  Discharge Placement              Patient chooses bed at: [facility] Patient to be transferred to facility by: [place] Name of family member notified: [name] Patient and family notified of of transfer: 03/24/21  Discharge Plan and Services     Post Acute Care Choice: [place]                               Social Determinants of Health (SDOH) Interventions     Readmission Risk Interventions Readmission Risk Prevention Plan 04/15/2019 10/23/2018 08/20/2018  Transportation Screening Complete Complete -  PCP or Specialist Appt within 3-5 Days - Complete -  HRI or [place] - Complete -  Social Work Consult for [place] Planning/Counseling - Complete -  Palliative Care Screening - Not Applicable -  Medication Review Press photographer) Complete Complete -  PCP or Specialist appointment within 3-5 days of discharge Complete - Complete  HRI or Home Care Consult Complete - Complete  SW Recovery Care/Counseling Consult Complete - Complete  Palliative Care Screening Not Applicable - Not [place] Patient Refused - Not Applicable  Some recent data might be hidden

## 2021-03-24 NOTE — Progress Notes (Signed)
Pt to d/c to [facility] today. Contacted [facility] ([facility]) and spoke to [place]. Clinic is prepared for pt to start on Monday. Pt's chair time has changed to 10:15 (the time that other [facility] snf residents treat). On the first day, pt can arrive at the regular arrival time of the other snf residents. Arrival time/chair time provided to CSW to provide to snf at d/c. Spoke to Lohman, renal PA, to request that orders be sent to SW for pt to start there on Monday.   Melven Sartorius Renal Navigator [phone]

## 2021-03-24 NOTE — Discharge Summary (Addendum)
Physician Discharge Summary  Jose Garrett. IOX:[id number] DOB: 19-Dec-1950 DOA: 02/27/2021  PCP: [name], NP  Admit date: 02/27/2021 Discharge date: 03/24/2021  Admitted From: Home Discharge disposition: SNF   Code Status: Full Code   Discharge Diagnosis:   Principal Problem:   Spinal cord compression ([place]) Active Problems:   Essential hypertension, benign   Anemia of chronic disease   ESRD (end stage renal disease) on dialysis ([place])   PAD (peripheral artery disease) ([facility])   Thrombocytopenia ([place])   Coronary artery disease involving native coronary artery of native heart without angina pectoris   S/P TAVR (transcatheter aortic valve replacement)   Chronic combined systolic and diastolic CHF (congestive heart failure) ([place])   Spinal stenosis   Dysphagia    Chief Complaint  Patient presents with   Weakness    Brief narrative: Jose Garrett. is a 71 y.o. male with PMH significant for amyloidosis, chronic combined CHF, ESRD, CAD, peripheral vascular disease status post right BKA, severe AS status post TAVR. Patient presented to the hospital on 02/27/2021 with complaint of increased weakness of bilateral arms, hands, fingers.  He was unable to lift his arms above the shoulder and had been wheelchair-bound for the past 3 weeks.  Imaging in the ED showed subacute spinal cord compression.   Neurosurgery was consulted. Admitted to hospital service Patient took some time to come to a decision regarding surgery.  Underwent surgery on 03/17/2021 after 2 weeks of initial presentation. See below for details.  Subjective: Patient was seen and examined this morning. Sitting in the chair.  Not in distress. Dysphagia gradually improving  Hospital course: Spinal cord compression - POA -Presented with increased weakness of both arms, hands fingers, wheelchair-bound status for 3 weeks.   -MRI on admission showed advanced degenerative changes resulting in severe spinal canal  stenosis with cord compression from C3-4 through C5-6.   -Neurosurgery was consulted.  Patient underwent ACDF C3-4, C4-5 C5-6 on 03/17/2021.   Dysphagia -Postoperatively, patient experienced significant dysphagia.  Seen by speech therapist.  Per neurology follow-up note from 1/10, patient was initiated on dexamethasone 4 mg every 6 hours.  I discussed this with patient's neurosurgeon Dr. Kathyrn Sheriff this morning.  He suggested to continue it for a 5-day course, no tapering needed after that. -Speech therapy follow-up appreciated.  Currently on dysphagia 2 diet.  Continue same post discharge.  Follow-up with speech therapy as an outpatient.  GERD -Continues to have nightly reflux.  Was started on Pepcid nightly yesterday.  Also remains on Protonix while on steroids.     ESRD HD MWF -Per nephrology   Essential hypertension -Continue amlodipine and hydralazine.  -Continue to monitor   Thrombocytopenia -Slightly low but stable.  Anemia of chronic disease -Hemoglobin stable.   Coronary artery disease involving native coronary artery of native heart without angina pectoris - POA -Most recent cath was done in May 2020 which showed severe single-vessel CAD with chronic total occlusion of the RCA, with collaterals.  Mild calcific nonobstructive LCA disease.   -Continue statin. -Plavix held on 03/11/2021 in preparation for neurosurgery on 03/17/2021. -Previous hospitalist discussed with Dr. Arnoldo Morale regarding resumption of Plavix.  He mentioned that it would be okay to resume after 1/8.  However was not been resumed yet because of dysphagia.  As patient is alert dysphagia 2 diet now, will resume Plavix.   PAD - POA -status post BKA on the right.  He has a femoropopliteal bypass graft on the left. -Plavix resumed  as stated above.   Chronic combined systolic and diastolic CHF - POA -Most recent 2D echo in August 2021 showed an EF of 45-50%, mild decreased LV function with global hypokinesis.  There  was also grade 1 diastolic dysfunction.  RV was mildly reduced as well.  Volume managed by dialysis.   S/P TAVR (transcatheter aortic valve replacement) Stable.  Mobility: PT eval obtained.  SNF recommended Living condition: Was living at home Goals of care:   Code Status: Full Code  Nutritional status: Body mass index is 21.51 kg/m.      Discharge Medications:   Allergies as of 03/24/2021       Reactions   Levonorgestrel-ethinyl Estrad Swelling        Medication List     STOP taking these medications    acetaminophen 325 MG tablet Commonly known as: TYLENOL   LORazepam 1 MG tablet Commonly known as: ATIVAN       TAKE these medications    (feeding supplement) PROSource Plus liquid Take 30 mLs by mouth 3 (three) times daily between meals.   feeding supplement (NEPRO CARB STEADY) Liqd Take 237 mLs by mouth 3 (three) times daily between meals.   albuterol 108 (90 Base) MCG/ACT inhaler Commonly known as: VENTOLIN HFA Inhale 1 puff into the lungs every 6 hours as needed What changed:  how much to take when to take this reasons to take this   amiodarone 200 MG tablet Commonly known as: PACERONE TAKE 1 TABLET (200 MG TOTAL) BY MOUTH DAILY. What changed: how much to take   amLODipine 10 MG tablet Commonly known as: NORVASC Take 1 tablet (10 mg total) by mouth at bedtime.   atorvastatin 80 MG tablet Commonly known as: LIPITOR TAKE 1 TABLET BY MOUTH DAILY WITH SUPPER What changed: how much to take   bisacodyl 10 MG suppository Commonly known as: DULCOLAX Place 1 suppository (10 mg total) rectally daily as needed for severe constipation.   calcitRIOL 0.5 MCG capsule Commonly known as: ROCALTROL Take 2 capsules (1 mcg total) by mouth every Monday, Wednesday, and Friday with hemodialysis.   calcium carbonate 500 MG chewable tablet Commonly known as: TUMS - dosed in mg elemental calcium Chew 1 tablet (200 mg of elemental calcium total) by mouth 3 (three)  times daily with meals as needed for indigestion or heartburn.   clopidogrel 75 MG tablet Commonly known as: PLAVIX TAKE 1 TABLET BY MOUTH DAILY WITH SUPPER What changed: how much to take   Darbepoetin Alfa 100 MCG/0.5ML Sosy injection Commonly known as: ARANESP Inject 0.5 mLs (100 mcg total) into the vein every Friday with hemodialysis.   dexamethasone 4 MG tablet Commonly known as: Decadron Take 1 tablet (4 mg total) by mouth 4 (four) times daily for 2 days.   docusate sodium 100 MG capsule Commonly known as: COLACE Take 1 capsule (100 mg total) by mouth 2 (two) times daily. What changed:  when to take this reasons to take this   ethyl chloride spray Apply 1 spray to dialysis access prior to dialysis as directed.   famotidine 20 MG tablet Commonly known as: PEPCID Take 1 tablet (20 mg total) by mouth at bedtime.   lanthanum 1000 MG chewable tablet Commonly known as: FOSRENOL Chew 2,000 mg by mouth 3 (three) times daily.   loperamide 2 MG capsule Commonly known as: IMODIUM Take 1 capsule (2 mg total) by mouth as needed for diarrhea or loose stools.   multivitamin Tabs tablet Take 1 tablet by  mouth at bedtime.   Rena-Vite Rx 1 MG Tabs Take 1 tablet by mouth once a day   ondansetron 4 MG tablet Commonly known as: ZOFRAN TAKE 1 TABLET BY MOUTH TWICE A DAY AS NEEDED What changed:  how much to take reasons to take this   oxyCODONE-acetaminophen 5-325 MG tablet Commonly known as: Percocet Take 1 tablet by mouth every 6 (six) hours as needed for up to 3 days for severe pain. What changed: when to take this   pantoprazole 40 MG tablet Commonly known as: PROTONIX Take 1 tablet (40 mg total) by mouth daily.   polyethylene glycol 17 g packet Commonly known as: MIRALAX / GLYCOLAX Take 17 g by mouth daily. Start taking on: March 25, 2021   senna-docusate 8.6-50 MG tablet Commonly known as: Senokot-S Take 2 tablets by mouth 2 (two) times daily.   vitamin B-12  1000 MCG tablet Commonly known as: CYANOCOBALAMIN Take 1 tablet (1,000 mcg total) by mouth every evening.               Discharge Care Instructions  (From admission, onward)           Start     Ordered   03/24/21 0000  Discharge wound care:        03/24/21 1123            Wound care:   Incision (Closed) 10/21/18 Arm Left (Active)  Date First Assessed/Time First Assessed: 10/21/18 1308   Location: Arm  Location Orientation: Left    Assessments 10/22/2018 10:45 AM 10/28/2018  7:46 AM  Dressing Type None None  Dressing Change Frequency -- PRN  Site / Wound Assessment Clean;Dry --  Drainage Amount None None     No Linked orders to display     Incision (Closed) 05/07/19 Knee Right (Active)  Date First Assessed/Time First Assessed: 05/07/19 0824   Location: Knee  Location Orientation: Right    Assessments 05/07/2019  9:00 AM 05/28/2019  8:07 AM  Dressing Type Compression wrap Abdominal pads;Other (Comment);Paper tape  Dressing -- Changed;Old drainage (marked);Clean;Dry;Intact  Dressing Change Frequency -- Daily  Site / Wound Assessment -- Bleeding;Clean;Painful;Red  Margins -- Attached edges (approximated)  Closure -- Staples  Drainage Amount None Minimal  Drainage Description -- Serosanguineous  Treatment -- Other (Comment);Cleansed     No Linked orders to display     Pressure Injury 05/13/19 Buttocks Right Stage 2 -  Partial thickness loss of dermis presenting as a shallow open injury with a red, pink wound bed without slough. (Active)  Date First Assessed/Time First Assessed: 05/13/19 0259   Location: Buttocks  Location Orientation: Right  Staging: Stage 2 -  Partial thickness loss of dermis presenting as a shallow open injury with a red, pink wound bed without slough.  Present on A...    Assessments 05/13/2019  2:59 AM 05/28/2019  8:07 AM  Dressing Type Foam - Lift dressing to assess site every shift None  Dressing Clean;Dry;Intact --  State of Healing -- Other  (Comment)  Site / Wound Assessment Pink;Red Pink;Dry  Wound Length (cm) 3 cm --  Wound Width (cm) 2 cm --  Wound Surface Area (cm^2) 6 cm^2 --  Margins Unattached edges (unapproximated) Attached edges (approximated)  Drainage Amount Scant --  Drainage Description Serosanguineous --  Treatment Cleansed Cleansed     No Linked orders to display     Incision (Closed) 11/02/20 Arm Left (Active)  Date First Assessed/Time First Assessed: 11/02/20 1438   Location: Arm  Location Orientation: Left    Assessments 11/02/2020  3:30 PM 11/14/2020  8:00 PM  Dressing Clean;Dry Clean;Dry;Intact  Site / Wound Assessment Clean;Dry;Dressing in place / Unable to assess Dressing in place / Unable to assess  Drainage Amount None --     No Linked orders to display     Incision (Closed) 03/17/21 Neck Other (Comment) (Active)  Date First Assessed/Time First Assessed: 03/17/21 1246   Location: Neck  Location Orientation: Other (Comment)    Assessments 03/17/2021  1:09 PM 03/23/2021  8:15 PM  Dressing Type Honeycomb Honeycomb  Dressing Clean;Dry;Intact Clean;Dry;Intact  Site / Wound Assessment Dressing in place / Unable to assess Dressing in place / Unable to assess  Margins -- Attached edges (approximated)  Drainage Amount -- None  Treatment -- Other (Comment)     No Linked orders to display    Discharge Instructions:   Discharge Instructions     Call MD for:  difficulty breathing, headache or visual disturbances   Complete by: As directed    Call MD for:  extreme fatigue   Complete by: As directed    Call MD for:  hives   Complete by: As directed    Call MD for:  persistant dizziness or light-headedness   Complete by: As directed    Call MD for:  persistant nausea and vomiting   Complete by: As directed    Call MD for:  severe uncontrolled pain   Complete by: As directed    Call MD for:  temperature >100.4   Complete by: As directed    Diet general   Complete by: As directed    Dysphagia 2  diet.   Discharge instructions   Complete by: As directed    General discharge instructions:  Follow with Primary MD [name], NP in 7 days   Get CBC/BMP checked in next visit within 1 week by PCP or SNF MD. (We routinely change or add medications that can affect your baseline labs and fluid status, therefore we recommend that you get the mentioned basic workup next visit with your PCP, your PCP may decide not to get them or add new tests based on their clinical decision)  On your next visit with your PCP, please get your medicines reviewed and adjusted.  Please request your PCP  to go over all hospital tests, procedures, radiology results at the follow up, please get all Hospital records sent to your PCP by signing hospital release before you go home.  Activity: As tolerated with Full fall precautions use walker/cane & assistance as needed  Avoid using any recreational substances like cigarette, tobacco, alcohol, or non-prescribed drug.  If you experience worsening of your admission symptoms, develop shortness of breath, life threatening emergency, suicidal or homicidal thoughts you must seek medical attention immediately by calling 911 or calling your MD immediately  if symptoms less severe.  You must read complete instructions/literature along with all the possible adverse reactions/side effects for all the medicines you take and that have been prescribed to you. Take any new medicine only after you have completely understood and accepted all the possible adverse reactions/side effects.   Do not drive, operate heavy machinery, perform activities at heights, swimming or participation in water activities or provide baby sitting services if your were admitted for syncope or siezures until you have seen by Primary MD or a Neurologist and advised to do so again.  Do not drive when taking Pain medications.  Do not take more than prescribed  Pain, Sleep and Anxiety Medications  Wear Seat  belts while driving.  Please note You were cared for by a hospitalist during your hospital stay. If you have any questions about your discharge medications or the care you received while you were in the hospital after you are discharged, you can call the unit and asked to speak with the hospitalist on call if the hospitalist that took care of you is not available. Once you are discharged, your primary care physician will handle any further medical issues. Please note that NO REFILLS for any discharge medications will be authorized once you are discharged, as it is imperative that you return to your primary care physician (or establish a relationship with a primary care physician if you do not have one) for your aftercare needs so that they can reassess your need for medications and monitor your lab values.   Discharge wound care:   Complete by: As directed    Incentive spirometry RT   Complete by: As directed    Increase activity slowly   Complete by: As directed        Follow ups:    Follow-up Information     Consuella Lose, MD Follow up in 3 week(s).   Specialty: Neurosurgery Contact information: [address] [place] [address] [phone]         [name], NP Follow up.   Specialty: Nurse Practitioner Contact information: [place] [address] [phone]         Burnell Blanks, MD .   Specialty: Cardiology Contact information: [address]  [place] [address] [phone]                 Discharge Exam:   Vitals:   03/24/21 0800 03/24/21 0830 03/24/21 0835 03/24/21 0919  BP: (!) 143/54 (!) 134/58 (!) 136/56 (!) 164/51  Pulse: 82 77 77 79  Resp: (!) 22 (!) 21 (!) 22 18  Temp:    98 F (36.7 C)  TempSrc:    Oral  SpO2: 96%  96% 99%  Weight: 68 kg     Height:        Body mass index is 21.51 kg/m.  General exam: Pleasant, elderly Caucasian male.  Not in physical  distress Skin: No rashes, lesions or ulcers. HEENT: Atraumatic, normocephalic, no obvious bleeding Lungs: Clear to auscultation bilaterally CVS: Regular rate and rhythm, no murmur GI/Abd soft, nontender, nondistended, bowel sound present CNS: Alert, awake, oriented x3 Psychiatry: Mood appropriate Extremities: trace pedal edema on the left, right BKA status  Time coordinating discharge: 35 minutes   The results of significant diagnostics from this hospitalization (including imaging, microbiology, ancillary and laboratory) are listed below for reference.    Procedures and Diagnostic Studies:   DG Chest 1 View  Result Date: 02/28/2021 CLINICAL DATA:  Weakness EXAM: CHEST  1 VIEW COMPARISON:  Chest x-ray 11/04/2020 FINDINGS: Heart is enlarged. Mediastinum appears stable. Cardiac surgical changes. Pulmonary vascular congestion with no focal consolidation identified. No significant pleural effusion visualized. No pneumothorax. IMPRESSION: Cardiomegaly and pulmonary vascular congestion. Electronically Signed   By: Ofilia Neas M.D.   On: 02/28/2021 13:17   CT HEAD WO CONTRAST (5MM)  Result Date: 02/27/2021 CLINICAL DATA:  Mental status change, unknown cause. EXAM: CT HEAD WITHOUT CONTRAST CT CERVICAL SPINE WITHOUT CONTRAST TECHNIQUE: Multidetector CT imaging of the head and cervical spine was performed following the standard protocol without intravenous contrast. Multiplanar CT image reconstructions of  the cervical spine were also generated. COMPARISON:  11/03/2020. FINDINGS: CT HEAD FINDINGS Brain: No acute intracranial hemorrhage, midline shift or mass effect. Diffuse atrophy is noted. Mild periventricular white matter hypodensities are seen bilaterally. There is no hydrocephalus. Vascular: Atherosclerotic calcification of the vertebral arteries and carotid siphons. No hyperdense vessel. Skull: Normal. Negative for fracture or focal lesion. Sinuses/Orbits: No acute finding. Other: None. CT  CERVICAL SPINE FINDINGS Alignment: There is mild retrolisthesis at C5-C6 and mild kyphosis of the mid cervical spine. Skull base and vertebrae: No acute fracture. No primary bone lesion or focal pathologic process. Soft tissues and spinal canal: No prevertebral fluid or swelling. No visible canal hematoma. Disc levels: Multilevel intervertebral disc space narrowing, osteophyte formation, and facet arthropathy is noted at multiple levels. There are disc osteophyte complexes at C3-C4, C4-C5, and C5-C6 resulting in moderate to severe spinal canal and neural foraminal stenosis. Upper chest: There are moderate layering bilateral pleural effusions with hazy airspace opacities in the lungs. Other: Atherosclerotic calcification of the carotid arteries. IMPRESSION: 1. No acute intracranial process. 2. Atrophy with chronic microvascular ischemic changes. 3. No acute fracture or subluxation in the cervical spine. 4. Multilevel disc herniations, disc osteophyte complexes and facet arthropathy resulting in moderate to severe spinal canal and neural foraminal stenosis from C3-C6. MRI and/or surgical consultation is suggested for follow-up. Electronically Signed   By: Brett Fairy M.D.   On: 02/27/2021 21:18   CT Cervical Spine Wo Contrast  Result Date: 02/27/2021 CLINICAL DATA:  Mental status change, unknown cause. EXAM: CT HEAD WITHOUT CONTRAST CT CERVICAL SPINE WITHOUT CONTRAST TECHNIQUE: Multidetector CT imaging of the head and cervical spine was performed following the standard protocol without intravenous contrast. Multiplanar CT image reconstructions of the cervical spine were also generated. COMPARISON:  11/03/2020. FINDINGS: CT HEAD FINDINGS Brain: No acute intracranial hemorrhage, midline shift or mass effect. Diffuse atrophy is noted. Mild periventricular white matter hypodensities are seen bilaterally. There is no hydrocephalus. Vascular: Atherosclerotic calcification of the vertebral arteries and carotid  siphons. No hyperdense vessel. Skull: Normal. Negative for fracture or focal lesion. Sinuses/Orbits: No acute finding. Other: None. CT CERVICAL SPINE FINDINGS Alignment: There is mild retrolisthesis at C5-C6 and mild kyphosis of the mid cervical spine. Skull base and vertebrae: No acute fracture. No primary bone lesion or focal pathologic process. Soft tissues and spinal canal: No prevertebral fluid or swelling. No visible canal hematoma. Disc levels: Multilevel intervertebral disc space narrowing, osteophyte formation, and facet arthropathy is noted at multiple levels. There are disc osteophyte complexes at C3-C4, C4-C5, and C5-C6 resulting in moderate to severe spinal canal and neural foraminal stenosis. Upper chest: There are moderate layering bilateral pleural effusions with hazy airspace opacities in the lungs. Other: Atherosclerotic calcification of the carotid arteries. IMPRESSION: 1. No acute intracranial process. 2. Atrophy with chronic microvascular ischemic changes. 3. No acute fracture or subluxation in the cervical spine. 4. Multilevel disc herniations, disc osteophyte complexes and facet arthropathy resulting in moderate to severe spinal canal and neural foraminal stenosis from C3-C6. MRI and/or surgical consultation is suggested for follow-up. Electronically Signed   By: Brett Fairy M.D.   On: 02/27/2021 21:18   CT Lumbar Spine Wo Contrast  Result Date: 02/27/2021 CLINICAL DATA:  Initial evaluation for low back pain, weakness. EXAM: CT LUMBAR SPINE WITHOUT CONTRAST TECHNIQUE: Multidetector CT imaging of the lumbar spine was performed without intravenous contrast administration. Multiplanar CT image reconstructions were also generated. COMPARISON:  Prior MRI from 02/25/2015. FINDINGS: Segmentation: Standard. Lowest well-formed  disc space labeled the L5-S1 level. Alignment: Physiologic with preservation of the normal lumbar lordosis. No listhesis. Vertebrae: Vertebral body height maintained  without acute or chronic fracture. Visualized sacrum and pelvis intact. SI joints symmetric and normal. No discrete or worrisome osseous lesions. Paraspinal and other soft tissues: Paraspinous soft tissues demonstrate no acute finding. Extensive arteriosclerosis seen throughout the visualized abdomen and pelvis. Visualized native kidneys are atrophic, compatible with history of end-stage renal disease. Approximate 1.5 cm benign appearing cyst noted at the left kidney. Bilateral pleural effusions partially visualized. Disc levels: L1-2: Minimal annular disc bulge with mild facet hypertrophy. No spinal stenosis. Foramina remain patent. L2-3: Mild annular disc bulge with bilateral facet hypertrophy. Resultant mild narrowing of the lateral recesses bilaterally. Central canal remains patent. Foramina remain patent. L3-4: Mild diffuse disc bulge with associated biforaminal disc protrusions. Mild to moderate right greater than left facet hypertrophy. Resultant mild canal with moderate bilateral lateral recess stenosis. Moderate bilateral L3 foraminal stenosis. L4-5: Diffuse disc bulge, slightly asymmetric to the left. Mild to moderate bilateral facet hypertrophy. Resultant moderate narrowing of the lateral recesses bilaterally. Central canal remains patent. Moderate left with mild to moderate right L4 foraminal stenosis. L5-S1: Advanced degenerative intervertebral disc space narrowing with disc desiccation. Associated reactive endplate spurring. Broad-based posterior disc bulge contacts both of the descending S1 nerve roots as they course through the lateral recesses. No frank neural impingement or displacement. Moderate facet hypertrophy. No significant spinal stenosis. Mild to moderate right worse than left L5 foraminal narrowing. IMPRESSION: 1. No acute abnormality within the lumbar spine. 2. Multifactorial degenerative changes at L3-4 and L4-5 with resultant moderate bilateral lateral recess stenosis. 3. Moderate  bilateral L3, L4, and L5 foraminal narrowing related to disc bulge, reactive endplate spurring, and facet hypertrophy. 4. Bilateral pleural effusions, partially visualized. 5.  Aortic Atherosclerosis (ICD10-I70.0). Electronically Signed   By: Jeannine Boga M.D.   On: 02/27/2021 21:35   MR Cervical Spine Wo Contrast  Result Date: 02/28/2021 CLINICAL DATA:  Weakness EXAM: MRI CERVICAL, THORACIC AND LUMBAR SPINE WITHOUT CONTRAST TECHNIQUE: Multiplanar and multiecho pulse sequences of the cervical spine, to include the craniocervical junction and cervicothoracic junction, and thoracic and lumbar spine, were obtained without intravenous contrast. COMPARISON:  Cervical and lumbar spine CT 02/27/2021, CTA chest 09/30/2018, lumbar spine MRI 02/25/2015 FINDINGS: MRI CERVICAL SPINE FINDINGS Alignment: There is slight reversal of the normal cervical spine lordosis centered at C4. There is trace retrolisthesis of C5 on C6. Alignment is otherwise normal. Vertebrae: Vertebral body heights are preserved. Heterogeneous marrow signal at C3 through C7 is likely degenerative in nature. There is no suspicious marrow signal abnormality. Cord: The cord is compressed from C3-C4 through C5-C6. There is suspected signal abnormality in the left aspect of the cord at C3-C4 (4-14). No other definite cord signal abnormality is seen. Posterior Fossa, vertebral arteries, paraspinal tissues: The imaged posterior fossa is unremarkable. The paraspinal soft tissues are unremarkable. The left vertebral artery flow void is present. The right vertebral artery flow void is not definitely seen. Disc levels: There is marked disc space narrowing at C3-C4 through C6-C7. C2-C3: There is mild uncovertebral and bilateral facet arthropathy resulting in mild spinal canal stenosis without significant neural foraminal stenosis C3-C4: There is a posterior disc osteophyte complex with a prominent broad-based protrusion, and uncovertebral and bilateral  facet arthropathy resulting in severe spinal canal stenosis with cord compression and possible cord signal abnormality as above, and severe bilateral neural foraminal stenosis. C4-C5: There is a posterior disc  osteophyte complex with a broad-based protrusion and uncovertebral and bilateral facet arthropathy resulting in severe spinal canal stenosis with cord compression and severe bilateral neural foraminal stenosis. C5-C6: There is a posterior disc osteophyte complex with a left paracentral protrusion and uncovertebral and facet arthropathy resulting in severe spinal canal stenosis with cord compression and severe bilateral neural foraminal stenosis. C6-C7: There is a mild posterior disc osteophyte complex and uncovertebral and bilateral facet arthropathy resulting in mild spinal canal stenosis and moderate to severe left and mild right neural foraminal stenosis C7-T1: No significant spinal canal or neural foraminal stenosis. MRI THORACIC SPINE FINDINGS Alignment:  Normal. Vertebrae: Vertebral body heights are preserved. There is mild degenerative endplate marrow signal abnormality at T10-T11 with associated prominent Schmorl's nodes indenting the adjacent endplates. A few smaller Schmorl's nodes are also noted in the upper thoracic spine. There is no suspicious marrow signal abnormality. Cord:  Normal in signal and morphology. Paraspinal and other soft tissues: There are moderate size bilateral pleural effusions. Paraspinal soft tissues are unremarkable. Disc levels: There is multilevel disc space narrowing and mild degenerative endplate change. There is no significant spinal canal or neural foraminal stenosis. MRI LUMBAR SPINE FINDINGS Segmentation: Standard; the lowest formed disc space is designated L5-S1. Alignment:  Normal. Vertebrae: Vertebral body heights are preserved. There is degenerative endplate marrow signal abnormality at L5-S1. There is no suspicious marrow signal abnormality. Conus medullaris and  cauda equina: Conus extends to the mid L1 level. Conus and cauda equina appear normal. Paraspinal and other soft tissues: There are innumerable cysts in the bilateral atrophic kidneys. A T2 hyperintense lesion in the right hepatic lobe likely reflects a cyst. The paraspinal soft tissues are unremarkable. Disc levels: There is disc desiccation and narrowing at L5-S1. The disc spaces are otherwise overall preserved. There is relatively mild multilevel facet arthropathy throughout the lumbar spine. T12-L1: There is a minimal disc bulge without significant spinal canal or neural foraminal stenosis. L1-L2: There is a minimal disc bulge without significant spinal canal or neural foraminal stenosis. L2-L3: There is a mild disc bulge with bilateral foraminal components and mild degenerative endplate change and bilateral facet arthropathy resulting in mild left worse than right neural foraminal stenosis and mild subarticular zone narrowing without significant spinal canal stenosis. L3-L4: There is a diffuse disc bulge with bilateral foraminal components and degenerative endplate change and bilateral facet arthropathy resulting in mild-to-moderate spinal canal stenosis with crowding of the subarticular zones and moderate bilateral neural foraminal stenosis. L4-L5: There is a diffuse disc bulge with a left foraminal component, degenerative endplate change, and mild bilateral facet arthropathy resulting in mild crowding of the subarticular zones without significant spinal canal stenosis and moderate left and mild right neural foraminal stenosis. L5-S1: There is a disc bulge eccentric to the right with a right foraminal component, degenerative endplate change, and bilateral facet arthropathy resulting in mild crowding of the subarticular zones, right worse than left, without evidence of nerve root impingement, and moderate to severe right and mild left neural foraminal stenosis. IMPRESSION: CERVICAL SPINE MRI: 1. Advanced  degenerative change in the cervical spine as detailed above resulting in severe spinal canal stenosis with cord compression from C3-C4 through C5-C6. There is possible cord signal abnormality in the left aspect of the cord at C3-C4 which may reflect compressive myelopathy or myelomalacia. 2. Severe bilateral neural foraminal stenosis at C3-C4 through C5-C6 and moderate to severe left neural foraminal stenosis at C6-C7. 3. The right vertebral artery flow void is not definitely  seen which could reflect chronic occlusion. This could be further evaluated with CTA or MRA of the neck as indicated. THORACIC SPINE MRI: 1. Mild multilevel degenerative changes. No high-grade spinal canal or neural foraminal stenosis. 2. Moderate bilateral pleural effusions. LUMBAR SPINE MRI: 1. Mild-to-moderate spinal canal stenosis at L3-L4 with crowding of the subarticular zones but no definite evidence of nerve root impingement. There is more mild subarticular zone narrowing at L2-L3, L4-L5, and L5-S1 also without evidence of nerve root impingement. 2. Moderate bilateral neural foraminal stenosis at L3-L4, moderate left neural foraminal stenosis at L4-L5, and moderate to severe right neural foraminal stenosis at L5-S1. 3. Relatively mild facet arthropathy throughout the lumbar spine. Electronically Signed   By: Valetta Mole M.D.   On: 02/28/2021 11:43   MR THORACIC SPINE WO CONTRAST  Result Date: 02/28/2021 CLINICAL DATA:  Weakness EXAM: MRI CERVICAL, THORACIC AND LUMBAR SPINE WITHOUT CONTRAST TECHNIQUE: Multiplanar and multiecho pulse sequences of the cervical spine, to include the craniocervical junction and cervicothoracic junction, and thoracic and lumbar spine, were obtained without intravenous contrast. COMPARISON:  Cervical and lumbar spine CT 02/27/2021, CTA chest 09/30/2018, lumbar spine MRI 02/25/2015 FINDINGS: MRI CERVICAL SPINE FINDINGS Alignment: There is slight reversal of the normal cervical spine lordosis centered at  C4. There is trace retrolisthesis of C5 on C6. Alignment is otherwise normal. Vertebrae: Vertebral body heights are preserved. Heterogeneous marrow signal at C3 through C7 is likely degenerative in nature. There is no suspicious marrow signal abnormality. Cord: The cord is compressed from C3-C4 through C5-C6. There is suspected signal abnormality in the left aspect of the cord at C3-C4 (4-14). No other definite cord signal abnormality is seen. Posterior Fossa, vertebral arteries, paraspinal tissues: The imaged posterior fossa is unremarkable. The paraspinal soft tissues are unremarkable. The left vertebral artery flow void is present. The right vertebral artery flow void is not definitely seen. Disc levels: There is marked disc space narrowing at C3-C4 through C6-C7. C2-C3: There is mild uncovertebral and bilateral facet arthropathy resulting in mild spinal canal stenosis without significant neural foraminal stenosis C3-C4: There is a posterior disc osteophyte complex with a prominent broad-based protrusion, and uncovertebral and bilateral facet arthropathy resulting in severe spinal canal stenosis with cord compression and possible cord signal abnormality as above, and severe bilateral neural foraminal stenosis. C4-C5: There is a posterior disc osteophyte complex with a broad-based protrusion and uncovertebral and bilateral facet arthropathy resulting in severe spinal canal stenosis with cord compression and severe bilateral neural foraminal stenosis. C5-C6: There is a posterior disc osteophyte complex with a left paracentral protrusion and uncovertebral and facet arthropathy resulting in severe spinal canal stenosis with cord compression and severe bilateral neural foraminal stenosis. C6-C7: There is a mild posterior disc osteophyte complex and uncovertebral and bilateral facet arthropathy resulting in mild spinal canal stenosis and moderate to severe left and mild right neural foraminal stenosis C7-T1: No  significant spinal canal or neural foraminal stenosis. MRI THORACIC SPINE FINDINGS Alignment:  Normal. Vertebrae: Vertebral body heights are preserved. There is mild degenerative endplate marrow signal abnormality at T10-T11 with associated prominent Schmorl's nodes indenting the adjacent endplates. A few smaller Schmorl's nodes are also noted in the upper thoracic spine. There is no suspicious marrow signal abnormality. Cord:  Normal in signal and morphology. Paraspinal and other soft tissues: There are moderate size bilateral pleural effusions. Paraspinal soft tissues are unremarkable. Disc levels: There is multilevel disc space narrowing and mild degenerative endplate change. There is no significant spinal canal  or neural foraminal stenosis. MRI LUMBAR SPINE FINDINGS Segmentation: Standard; the lowest formed disc space is designated L5-S1. Alignment:  Normal. Vertebrae: Vertebral body heights are preserved. There is degenerative endplate marrow signal abnormality at L5-S1. There is no suspicious marrow signal abnormality. Conus medullaris and cauda equina: Conus extends to the mid L1 level. Conus and cauda equina appear normal. Paraspinal and other soft tissues: There are innumerable cysts in the bilateral atrophic kidneys. A T2 hyperintense lesion in the right hepatic lobe likely reflects a cyst. The paraspinal soft tissues are unremarkable. Disc levels: There is disc desiccation and narrowing at L5-S1. The disc spaces are otherwise overall preserved. There is relatively mild multilevel facet arthropathy throughout the lumbar spine. T12-L1: There is a minimal disc bulge without significant spinal canal or neural foraminal stenosis. L1-L2: There is a minimal disc bulge without significant spinal canal or neural foraminal stenosis. L2-L3: There is a mild disc bulge with bilateral foraminal components and mild degenerative endplate change and bilateral facet arthropathy resulting in mild left worse than right  neural foraminal stenosis and mild subarticular zone narrowing without significant spinal canal stenosis. L3-L4: There is a diffuse disc bulge with bilateral foraminal components and degenerative endplate change and bilateral facet arthropathy resulting in mild-to-moderate spinal canal stenosis with crowding of the subarticular zones and moderate bilateral neural foraminal stenosis. L4-L5: There is a diffuse disc bulge with a left foraminal component, degenerative endplate change, and mild bilateral facet arthropathy resulting in mild crowding of the subarticular zones without significant spinal canal stenosis and moderate left and mild right neural foraminal stenosis. L5-S1: There is a disc bulge eccentric to the right with a right foraminal component, degenerative endplate change, and bilateral facet arthropathy resulting in mild crowding of the subarticular zones, right worse than left, without evidence of nerve root impingement, and moderate to severe right and mild left neural foraminal stenosis. IMPRESSION: CERVICAL SPINE MRI: 1. Advanced degenerative change in the cervical spine as detailed above resulting in severe spinal canal stenosis with cord compression from C3-C4 through C5-C6. There is possible cord signal abnormality in the left aspect of the cord at C3-C4 which may reflect compressive myelopathy or myelomalacia. 2. Severe bilateral neural foraminal stenosis at C3-C4 through C5-C6 and moderate to severe left neural foraminal stenosis at C6-C7. 3. The right vertebral artery flow void is not definitely seen which could reflect chronic occlusion. This could be further evaluated with CTA or MRA of the neck as indicated. THORACIC SPINE MRI: 1. Mild multilevel degenerative changes. No high-grade spinal canal or neural foraminal stenosis. 2. Moderate bilateral pleural effusions. LUMBAR SPINE MRI: 1. Mild-to-moderate spinal canal stenosis at L3-L4 with crowding of the subarticular zones but no definite  evidence of nerve root impingement. There is more mild subarticular zone narrowing at L2-L3, L4-L5, and L5-S1 also without evidence of nerve root impingement. 2. Moderate bilateral neural foraminal stenosis at L3-L4, moderate left neural foraminal stenosis at L4-L5, and moderate to severe right neural foraminal stenosis at L5-S1. 3. Relatively mild facet arthropathy throughout the lumbar spine. Electronically Signed   By: Valetta Mole M.D.   On: 02/28/2021 11:43   MR LUMBAR SPINE WO CONTRAST  Result Date: 02/28/2021 CLINICAL DATA:  Weakness EXAM: MRI CERVICAL, THORACIC AND LUMBAR SPINE WITHOUT CONTRAST TECHNIQUE: Multiplanar and multiecho pulse sequences of the cervical spine, to include the craniocervical junction and cervicothoracic junction, and thoracic and lumbar spine, were obtained without intravenous contrast. COMPARISON:  Cervical and lumbar spine CT 02/27/2021, CTA chest 09/30/2018, lumbar  spine MRI 02/25/2015 FINDINGS: MRI CERVICAL SPINE FINDINGS Alignment: There is slight reversal of the normal cervical spine lordosis centered at C4. There is trace retrolisthesis of C5 on C6. Alignment is otherwise normal. Vertebrae: Vertebral body heights are preserved. Heterogeneous marrow signal at C3 through C7 is likely degenerative in nature. There is no suspicious marrow signal abnormality. Cord: The cord is compressed from C3-C4 through C5-C6. There is suspected signal abnormality in the left aspect of the cord at C3-C4 (4-14). No other definite cord signal abnormality is seen. Posterior Fossa, vertebral arteries, paraspinal tissues: The imaged posterior fossa is unremarkable. The paraspinal soft tissues are unremarkable. The left vertebral artery flow void is present. The right vertebral artery flow void is not definitely seen. Disc levels: There is marked disc space narrowing at C3-C4 through C6-C7. C2-C3: There is mild uncovertebral and bilateral facet arthropathy resulting in mild spinal canal stenosis  without significant neural foraminal stenosis C3-C4: There is a posterior disc osteophyte complex with a prominent broad-based protrusion, and uncovertebral and bilateral facet arthropathy resulting in severe spinal canal stenosis with cord compression and possible cord signal abnormality as above, and severe bilateral neural foraminal stenosis. C4-C5: There is a posterior disc osteophyte complex with a broad-based protrusion and uncovertebral and bilateral facet arthropathy resulting in severe spinal canal stenosis with cord compression and severe bilateral neural foraminal stenosis. C5-C6: There is a posterior disc osteophyte complex with a left paracentral protrusion and uncovertebral and facet arthropathy resulting in severe spinal canal stenosis with cord compression and severe bilateral neural foraminal stenosis. C6-C7: There is a mild posterior disc osteophyte complex and uncovertebral and bilateral facet arthropathy resulting in mild spinal canal stenosis and moderate to severe left and mild right neural foraminal stenosis C7-T1: No significant spinal canal or neural foraminal stenosis. MRI THORACIC SPINE FINDINGS Alignment:  Normal. Vertebrae: Vertebral body heights are preserved. There is mild degenerative endplate marrow signal abnormality at T10-T11 with associated prominent Schmorl's nodes indenting the adjacent endplates. A few smaller Schmorl's nodes are also noted in the upper thoracic spine. There is no suspicious marrow signal abnormality. Cord:  Normal in signal and morphology. Paraspinal and other soft tissues: There are moderate size bilateral pleural effusions. Paraspinal soft tissues are unremarkable. Disc levels: There is multilevel disc space narrowing and mild degenerative endplate change. There is no significant spinal canal or neural foraminal stenosis. MRI LUMBAR SPINE FINDINGS Segmentation: Standard; the lowest formed disc space is designated L5-S1. Alignment:  Normal. Vertebrae:  Vertebral body heights are preserved. There is degenerative endplate marrow signal abnormality at L5-S1. There is no suspicious marrow signal abnormality. Conus medullaris and cauda equina: Conus extends to the mid L1 level. Conus and cauda equina appear normal. Paraspinal and other soft tissues: There are innumerable cysts in the bilateral atrophic kidneys. A T2 hyperintense lesion in the right hepatic lobe likely reflects a cyst. The paraspinal soft tissues are unremarkable. Disc levels: There is disc desiccation and narrowing at L5-S1. The disc spaces are otherwise overall preserved. There is relatively mild multilevel facet arthropathy throughout the lumbar spine. T12-L1: There is a minimal disc bulge without significant spinal canal or neural foraminal stenosis. L1-L2: There is a minimal disc bulge without significant spinal canal or neural foraminal stenosis. L2-L3: There is a mild disc bulge with bilateral foraminal components and mild degenerative endplate change and bilateral facet arthropathy resulting in mild left worse than right neural foraminal stenosis and mild subarticular zone narrowing without significant spinal canal stenosis. L3-L4: There is a  diffuse disc bulge with bilateral foraminal components and degenerative endplate change and bilateral facet arthropathy resulting in mild-to-moderate spinal canal stenosis with crowding of the subarticular zones and moderate bilateral neural foraminal stenosis. L4-L5: There is a diffuse disc bulge with a left foraminal component, degenerative endplate change, and mild bilateral facet arthropathy resulting in mild crowding of the subarticular zones without significant spinal canal stenosis and moderate left and mild right neural foraminal stenosis. L5-S1: There is a disc bulge eccentric to the right with a right foraminal component, degenerative endplate change, and bilateral facet arthropathy resulting in mild crowding of the subarticular zones, right worse  than left, without evidence of nerve root impingement, and moderate to severe right and mild left neural foraminal stenosis. IMPRESSION: CERVICAL SPINE MRI: 1. Advanced degenerative change in the cervical spine as detailed above resulting in severe spinal canal stenosis with cord compression from C3-C4 through C5-C6. There is possible cord signal abnormality in the left aspect of the cord at C3-C4 which may reflect compressive myelopathy or myelomalacia. 2. Severe bilateral neural foraminal stenosis at C3-C4 through C5-C6 and moderate to severe left neural foraminal stenosis at C6-C7. 3. The right vertebral artery flow void is not definitely seen which could reflect chronic occlusion. This could be further evaluated with CTA or MRA of the neck as indicated. THORACIC SPINE MRI: 1. Mild multilevel degenerative changes. No high-grade spinal canal or neural foraminal stenosis. 2. Moderate bilateral pleural effusions. LUMBAR SPINE MRI: 1. Mild-to-moderate spinal canal stenosis at L3-L4 with crowding of the subarticular zones but no definite evidence of nerve root impingement. There is more mild subarticular zone narrowing at L2-L3, L4-L5, and L5-S1 also without evidence of nerve root impingement. 2. Moderate bilateral neural foraminal stenosis at L3-L4, moderate left neural foraminal stenosis at L4-L5, and moderate to severe right neural foraminal stenosis at L5-S1. 3. Relatively mild facet arthropathy throughout the lumbar spine. Electronically Signed   By: Valetta Mole M.D.   On: 02/28/2021 11:43     Labs:   Basic Metabolic Panel: Recent Labs  Lab 03/20/21 0231 03/21/21 0147 03/22/21 0432 03/23/21 0227 03/24/21 0534  NA 134* 136 136 135 134*  K 4.4 4.0 4.9 5.3* 5.1  CL 94* 95* 96* 92* 96*  CO2 24 26 22 23 25   GLUCOSE 108* 89 142* 134* 131*  BUN 49* 36* 60* 89* 74*  CREATININE 8.72* 7.07* 9.43* 10.98* 8.05*  CALCIUM 9.8 9.4 9.6 10.3 9.5  PHOS 6.5* 6.1* 7.9* 7.7* 6.0*   GFR Estimated Creatinine  Clearance: 8.4 mL/min (A) (by C-G formula based on SCr of 8.05 mg/dL (H)). Liver Function Tests: Recent Labs  Lab 03/20/21 0231 03/21/21 0147 03/22/21 0432 03/23/21 0227 03/24/21 0534  ALBUMIN 3.5 3.3* 3.3* 3.6 3.2*   No results for input(s): LIPASE, AMYLASE in the last 168 hours. No results for input(s): AMMONIA in the last 168 hours. Coagulation profile No results for input(s): INR, PROTIME in the last 168 hours.  CBC: Recent Labs  Lab 03/19/21 0144 03/22/21 0432  WBC 7.4 7.8  HGB 11.4* 10.9*  HCT 34.7* 33.7*  MCV 97.5 95.5  PLT 129* 122*   Cardiac Enzymes: No results for input(s): CKTOTAL, CKMB, CKMBINDEX, TROPONINI in the last 168 hours. BNP: Invalid input(s): POCBNP CBG: No results for input(s): GLUCAP in the last 168 hours. D-Dimer No results for input(s): DDIMER in the last 72 hours. Hgb A1c No results for input(s): HGBA1C in the last 72 hours. Lipid Profile No results for input(s): CHOL, HDL, LDLCALC,  TRIG, CHOLHDL, LDLDIRECT in the last 72 hours. Thyroid function studies No results for input(s): TSH, T4TOTAL, T3FREE, THYROIDAB in the last 72 hours.  Invalid input(s): FREET3 Anemia work up No results for input(s): VITAMINB12, FOLATE, FERRITIN, TIBC, IRON, RETICCTPCT in the last 72 hours. Microbiology Recent Results (from the past 240 hour(s))  Surgical PCR screen     Status: None   Collection Time: 03/16/21  7:47 PM   Specimen: Nasal Mucosa; Nasal Swab  Result Value Ref Range Status   MRSA, PCR NEGATIVE NEGATIVE Final   Staphylococcus aureus NEGATIVE NEGATIVE Final    Comment: (NOTE) The Xpert SA Assay (FDA approved for NASAL specimens in patients 47 years of age and older), is one component of a comprehensive surveillance program. It is not intended to diagnose infection nor to guide or monitor treatment. Performed at [facility], [place] [address] [place], [place] [address]   Resp Panel by RT-PCR (Flu A&B, Covid) Nasopharyngeal Swab      Status: None   Collection Time: 03/23/21  4:04 PM   Specimen: Nasopharyngeal Swab; Nasopharyngeal(NP) swabs in vial transport medium  Result Value Ref Range Status   SARS Coronavirus 2 by RT PCR NEGATIVE NEGATIVE Final    Comment: (NOTE) SARS-CoV-2 target nucleic acids are NOT DETECTED.  The SARS-CoV-2 RNA is generally detectable in upper respiratory specimens during the acute phase of infection. The lowest concentration of SARS-CoV-2 viral copies this assay can detect is 138 copies/mL. A negative result does not preclude SARS-Cov-2 infection and should not be used as the sole basis for treatment or other patient management decisions. A negative result may occur with  improper specimen collection/handling, submission of specimen other than nasopharyngeal swab, presence of viral mutation(s) within the areas targeted by this assay, and inadequate number of viral copies(<138 copies/mL). A negative result must be combined with clinical observations, patient history, and epidemiological information. The expected result is Negative.  Fact Sheet for Patients:  EntrepreneurPulse.com.au  Fact Sheet for Healthcare Providers:  IncredibleEmployment.be  This test is no t yet approved or cleared by the [place] FDA and  has been authorized for detection and/or diagnosis of SARS-CoV-2 by FDA under an Emergency Use Authorization (EUA). This EUA will remain  in effect (meaning this test can be used) for the duration of the COVID-19 declaration under Section 564(b)(1) of the Act, 21 U.S.C.section 360bbb-3(b)(1), unless the authorization is terminated  or revoked sooner.       Influenza A by PCR NEGATIVE NEGATIVE Final   Influenza B by PCR NEGATIVE NEGATIVE Final    Comment: (NOTE) The Xpert Xpress SARS-CoV-2/FLU/RSV plus assay is intended as an aid in the diagnosis of influenza from Nasopharyngeal swab specimens and should not be used as a sole basis  for treatment. Nasal washings and aspirates are unacceptable for Xpert Xpress SARS-CoV-2/FLU/RSV testing.  Fact Sheet for Patients: EntrepreneurPulse.com.au  Fact Sheet for Healthcare Providers: IncredibleEmployment.be  This test is not yet approved or cleared by the [place] FDA and has been authorized for detection and/or diagnosis of SARS-CoV-2 by FDA under an Emergency Use Authorization (EUA). This EUA will remain in effect (meaning this test can be used) for the duration of the COVID-19 declaration under Section 564(b)(1) of the Act, 21 U.S.C. section 360bbb-3(b)(1), unless the authorization is terminated or revoked.  Performed at [facility], [place] [address] [place], [place] [address]      Signed: Terrilee Croak  [facility] 03/24/2021, 11:28 AM

## 2021-03-24 NOTE — Progress Notes (Signed)
Occupational Therapy Treatment Patient Details Name: Jose Garrett. MRN: [id number] DOB: Jul 18, 1950 Today's Date: 03/24/2021   History of present illness 71 y.o. male admitted on 02/27/21 due to increased weakness of BUE weakness and has become WC bound in the past 3 weeks due to weakness. MRI shows subacute c-spine cord compression. He underwent ACDF 1/6.  PMH significant for amyloidosis, chronic combined CHF, ESRD, CAD, peripheral vascular disease status post right BKA, severe AS status post TAVR.   OT comments  Pt making slow but steady progress with strength of his UEs as well as functional grasp.  Pt continues to be very limited when on his feet and unsafe to be standing without significant assist. Will continue to follow with focus on LE adls and standing tasks.    Recommendations for follow up therapy are one component of a multi-disciplinary discharge planning process, led by the attending physician.  Recommendations may be updated based on patient status, additional functional criteria and insurance authorization.    Follow Up Recommendations  Skilled nursing-short term rehab (<3 hours/day)    Assistance Recommended at Discharge Frequent or constant Supervision/Assistance  Patient can return home with the following  A lot of help with walking and/or transfers;A lot of help with bathing/dressing/bathroom;Direct supervision/assist for medications management;Assist for transportation;Help with stairs or ramp for entrance   Equipment Recommendations  None recommended by OT    Recommendations for Other Services      Precautions / Restrictions Precautions Precautions: Fall;Cervical Required Braces or Orthoses: Cervical Brace Cervical Brace: Hard collar Restrictions Weight Bearing Restrictions: No RLE Weight Bearing: Non weight bearing (RBKA)       Mobility Bed Mobility Overal bed mobility: Needs Assistance Bed Mobility: Rolling;Sidelying to Sit Rolling:  Supervision Sidelying to sit: HOB elevated;Min assist;+2 for physical assistance       General bed mobility comments: cues for log rolling for safety and precs, increased time to initiate and perform; min to modA for anterior scooting to foot flat with bed pad assist    Transfers Overall transfer level: Needs assistance Equipment used: Rolling walker (2 wheels) Transfers: Sit to/from Stand Sit to Stand: Mod assist;From elevated surface;+2 physical assistance   Step pivot transfers: Max assist;+2 physical assistance;From elevated surface       General transfer comment: cues needed for hand placement each attempt; x2 reps from EOB to RW then partial stand (squat) from chair with maxA +1 for placement of chair alarm pad     Balance Overall balance assessment: Needs assistance Sitting-balance support: Feet supported Sitting balance-Leahy Scale: Good Sitting balance - Comments: static sitting and anterior leaning to don prosthetic and pants with supervision and occasional min guard for balance (pt ultimately needed external assist to safely don prosthetic sleeve) Postural control: Posterior lean Standing balance support: Bilateral upper extremity supported;Reliant on assistive device for balance;During functional activity Standing balance-Leahy Scale: Poor Standing balance comment: +1 mod/maxA for static standing, up to +2 maxA for dynamic standing with regular RW                           ADL either performed or assessed with clinical judgement   ADL Overall ADL's : Needs assistance/impaired Eating/Feeding: Set up;Sitting Eating/Feeding Details (indicate cue type and reason): Pt did not need built up handles today. Pt may need assist cutting. Grooming: Wash/dry hands;Wash/dry face;Oral care;Set up;Sitting   Upper Body Bathing: Moderate assistance;Sitting           Lower  Body Dressing: Maximal assistance;Sit to/from stand;Cueing for compensatory techniques Lower  Body Dressing Details (indicate cue type and reason): Pt required assist to donn prosthetic sleeve. Pt required cues for technique to dress LEs but able to donn underwear and pants to his thiahs.  Assist needed to stand and max assist to pull pants up. Toilet Transfer: +2 for physical assistance;Moderate assistance;Stand-pivot;BSC/3in1 Armed forces technical officer Details (indicate cue type and reason): Pt able to tolerate short distances of ambulation with +2 assist. Pt able to transfer self with lat scoot from drop arm surfaces Toileting- Clothing Manipulation and Hygiene: Maximal assistance;Sitting/lateral lean;Sit to/from stand       Functional mobility during ADLs: +2 for physical assistance;Standard walker;Maximal assistance General ADL Comments: Pt with improved ability to sit on EOB and complete LE adls.  Increased use of BUES during all adls.  Continues to be very unsteady on his feet.    Extremity/Trunk Assessment Upper Extremity Assessment Upper Extremity Assessment: RUE deficits/detail;LUE deficits/detail RUE Deficits / Details: Grip strength improving although still has poor coordination.  Able to donn mask and grip walker. RUE: Unable to fully assess due to pain RUE Sensation: decreased light touch;decreased proprioception RUE Coordination: decreased fine motor;decreased gross motor LUE Deficits / Details: Increased grip strength and functional use. Pt able to use fork without built up handles. LUE Sensation: decreased light touch;decreased proprioception LUE Coordination: decreased fine motor;decreased gross motor   Lower Extremity Assessment Lower Extremity Assessment: Defer to PT evaluation        Vision   Vision Assessment?: No apparent visual deficits   Perception Perception Perception: Not tested   Praxis      Cognition Arousal/Alertness: Awake/alert Behavior During Therapy: WFL for tasks assessed/performed Overall Cognitive Status: Within Functional Limits for tasks  assessed                                 General Comments: pt requiring cues for safety with hand placement and body mechanics each transfer despite previous education in prior sessions          Exercises     Shoulder Instructions       General Comments Pt very ataxic when on his feet and unsafe walking.    Pertinent Vitals/ Pain       Pain Assessment: 0-10 Pain Score: 5  Breathing: normal Negative Vocalization: none Facial Expression: smiling or inexpressive Body Language: relaxed Consolability: no need to console PAINAD Score: 0 Pain Location: BUE and Rt shoulder with transfers Pain Descriptors / Indicators: Grimacing;Discomfort Pain Intervention(s): Monitored during session;Repositioned  Home Living                                          Prior Functioning/Environment              Frequency  Min 2X/week        Progress Toward Goals  OT Goals(current goals can now be found in the care plan section)  Progress towards OT goals: Progressing toward goals  Acute Rehab OT Goals Patient Stated Goal: to go to rehab OT Goal Formulation: With patient Time For Goal Achievement: 03/30/21 Potential to Achieve Goals: Fair ADL Goals Pt Will Perform Grooming: with set-up;sitting Pt Will Perform Lower Body Bathing: with min guard assist;with adaptive equipment;sitting/lateral leans;sit to/from stand Pt Will Perform Lower Body Dressing: with min assist;sitting/lateral  leans;sit to/from stand;with adaptive equipment Pt Will Transfer to Toilet: with min assist;with transfer board Pt Will Perform Toileting - Clothing Manipulation and hygiene: with min assist;sitting/lateral leans;sit to/from stand;with adaptive equipment  Plan Discharge plan remains appropriate;Frequency remains appropriate    Co-evaluation    PT/OT/SLP Co-Evaluation/Treatment: Yes Reason for Co-Treatment: Complexity of the patient's impairments (multi-system  involvement) PT goals addressed during session: Mobility/safety with mobility OT goals addressed during session: ADL's and self-care      AM-PAC OT "6 Clicks" Daily Activity     Outcome Measure   Help from another person eating meals?: A Little Help from another person taking care of personal grooming?: A Little Help from another person toileting, which includes using toliet, bedpan, or urinal?: A Lot Help from another person bathing (including washing, rinsing, drying)?: A Lot Help from another person to put on and taking off regular upper body clothing?: A Little Help from another person to put on and taking off regular lower body clothing?: A Lot 6 Click Score: 15    End of Session Equipment Utilized During Treatment: Gait belt;Rolling walker (2 wheels)  OT Visit Diagnosis: Unsteadiness on feet (R26.81);Other abnormalities of gait and mobility (R26.89);Muscle weakness (generalized) (M62.81)   Activity Tolerance Patient tolerated treatment well   Patient Left in chair;with call bell/phone within reach   Nurse Communication Mobility status        Time: 9892-1194 OT Time Calculation (min): 29 min  Charges: OT General Charges $OT Visit: 1 Visit OT Treatments $Self Care/Home Management : 23-37 mins   Glenford Peers 03/24/2021, 12:14 PM

## 2021-03-24 NOTE — Progress Notes (Signed)
Physical Therapy Treatment Patient Details Name: Jose Garrett. MRN: [id number] DOB: 06-26-1950 Today's Date: 03/24/2021   History of Present Illness 71 y.o. male admitted on 02/27/21 due to increased weakness of BUE weakness and has become WC bound in the past 3 weeks due to weakness. MRI shows subacute c-spine cord compression. He underwent ACDF 1/6.  PMH significant for amyloidosis, chronic combined CHF, ESRD, CAD, peripheral vascular disease status post right BKA, severe AS status post TAVR.    PT Comments    Pt received in supine, agreeable to therapy session and motivated to perform transfer training. Pt attempted to don prosthetic gel sleeve but ultimately needed therapist assist to don while seated EOB, of note sleeve appears somewhat loose and he may benefit from prosthetist consult to see if different sleeve size needed. Pt needing up to +2 modA for sit<>stand from elevated surface and up to +2 maxA for step pivot transfer to chair due to heavy posterior lean and difficulty sequencing steps safely. Pt continues to benefit from PT services to progress toward functional mobility goals.   Recommendations for follow up therapy are one component of a multi-disciplinary discharge planning process, led by the attending physician.  Recommendations may be updated based on patient status, additional functional criteria and insurance authorization.  Follow Up Recommendations  Skilled nursing-short term rehab (<3 hours/day)     Assistance Recommended at Discharge Frequent or constant Supervision/Assistance  Patient can return home with the following Two people to help with walking and/or transfers;A lot of help with bathing/dressing/bathroom;Assistance with cooking/housework;Assist for transportation;Help with stairs or ramp for entrance   Equipment Recommendations  None recommended by PT    Recommendations for Other Services       Precautions / Restrictions Precautions Precautions:  Fall;Cervical Required Braces or Orthoses: Cervical Brace Cervical Brace: Hard collar Restrictions Weight Bearing Restrictions: No     Mobility  Bed Mobility Overal bed mobility: Needs Assistance Bed Mobility: Rolling;Sidelying to Sit Rolling: Supervision Sidelying to sit: HOB elevated;Min assist;+2 for physical assistance       General bed mobility comments: cues for log rolling for safety and precs, increased time to initiate and perform; min to modA for anterior scooting to foot flat with bed pad assist    Transfers Overall transfer level: Needs assistance Equipment used: Rolling walker (2 wheels) Transfers: Sit to/from Stand Sit to Stand: Mod assist;From elevated surface;+2 physical assistance     Step pivot transfers: Max assist;+2 physical assistance;From elevated surface     General transfer comment: cues needed for hand placement each attempt; x2 reps from EOB to RW then partial stand (squat) from chair with maxA +1 for placement of chair alarm pad    Ambulation/Gait   Pre-gait activities: defer, pt very fatigued after step pivot to chair and requesting to eat lunch which had recently arrived       Balance Overall balance assessment: Needs assistance Sitting-balance support: Feet supported Sitting balance-Leahy Scale: Good Sitting balance - Comments: static sitting and anterior leaning to don prosthetic and pants with supervision and occasional min guard for balance (pt ultimately needed external assist to safely don prosthetic sleeve) Postural control: Posterior lean Standing balance support: Bilateral upper extremity supported;Reliant on assistive device for balance;During functional activity Standing balance-Leahy Scale:  (Poor to Zero) Standing balance comment: +1 mod/maxA for static standing, up to +2 maxA for dynamic standing with regular RW              Cognition Arousal/Alertness: Awake/alert Behavior During  Therapy: WFL for tasks  assessed/performed Overall Cognitive Status: Within Functional Limits for tasks assessed         General Comments: pt requiring cues for safety with hand placement and body mechanics each transfer despite previous education in prior sessions           General Comments General comments (skin integrity, edema, etc.): VSS on RA, no acute s/sx distress, HR 80's bpm while seated EOB      Pertinent Vitals/Pain Pain Assessment: 0-10 Pain Score: 5  Breathing: normal Negative Vocalization: none Facial Expression: smiling or inexpressive Body Language: relaxed Consolability: no need to console PAINAD Score: 0 Pain Location: BUE and Rt shoulder with transfers Pain Descriptors / Indicators: Grimacing;Discomfort Pain Intervention(s): Monitored during session;Repositioned     PT Goals (current goals can now be found in the care plan section) Acute Rehab PT Goals Patient Stated Goal: to improve hand function and walking PT Goal Formulation: With patient Time For Goal Achievement: 04/02/21 Progress towards PT goals: Progressing toward goals    Frequency    Min 2X/week      PT Plan Current plan remains appropriate    Co-evaluation PT/OT/SLP Co-Evaluation/Treatment: Yes Reason for Co-Treatment: For patient/therapist safety;To address functional/ADL transfers;Necessary to address cognition/behavior during functional activity PT goals addressed during session: Mobility/safety with mobility;Balance;Proper use of DME        AM-PAC PT "6 Clicks" Mobility   Outcome Measure  Help needed turning from your back to your side while in a flat bed without using bedrails?: A Little Help needed moving from lying on your back to sitting on the side of a flat bed without using bedrails?: A Lot Help needed moving to and from a bed to a chair (including a wheelchair)?: Total Help needed standing up from a chair using your arms (e.g., wheelchair or bedside chair)?: A Lot Help needed to walk in  hospital room?: Total Help needed climbing 3-5 steps with a railing? : Total 6 Click Score: 10    End of Session Equipment Utilized During Treatment: Gait belt Activity Tolerance: Patient limited by fatigue;Patient tolerated treatment well Patient left: in chair;with call bell/phone within reach;with chair alarm set Nurse Communication: Mobility status (+2 for lateral scoot vs stand pivot with staff assist) PT Visit Diagnosis: Other abnormalities of gait and mobility (R26.89);Muscle weakness (generalized) (M62.81)     Time: 4818-5631 PT Time Calculation (min) (ACUTE ONLY): 28 min  Charges:  $Therapeutic Activity: 8-22 mins                     Sally-Anne Wamble P., PTA Acute Rehabilitation Services Pager: [phone] Office: [place] 03/24/2021, 12:02 PM

## 2021-03-28 ENCOUNTER — Encounter: Payer: Medicare Other | Admitting: Occupational Therapy

## 2021-03-30 ENCOUNTER — Encounter: Payer: Medicare Other | Admitting: Occupational Therapy

## 2021-04-04 ENCOUNTER — Encounter: Payer: Medicare Other | Admitting: Occupational Therapy

## 2021-04-06 ENCOUNTER — Encounter: Payer: Medicare Other | Admitting: Occupational Therapy

## 2021-04-12 ENCOUNTER — Non-Acute Institutional Stay: Payer: Medicare Other | Admitting: Internal Medicine

## 2021-04-12 ENCOUNTER — Encounter: Payer: Self-pay | Admitting: Internal Medicine

## 2021-04-12 ENCOUNTER — Other Ambulatory Visit: Payer: Self-pay

## 2021-04-12 VITALS — BP 154/60 | HR 67

## 2021-04-12 DIAGNOSIS — M4802 Spinal stenosis, cervical region: Secondary | ICD-10-CM

## 2021-04-12 DIAGNOSIS — Z515 Encounter for palliative care: Secondary | ICD-10-CM

## 2021-04-12 DIAGNOSIS — R5383 Other fatigue: Secondary | ICD-10-CM

## 2021-04-12 DIAGNOSIS — R630 Anorexia: Secondary | ICD-10-CM

## 2021-04-12 DIAGNOSIS — R531 Weakness: Secondary | ICD-10-CM

## 2021-04-12 NOTE — Progress Notes (Signed)
[facility] Palliative Care Consult Note Telephone: [phone]  Fax: [phone]   Date of encounter: 04/12/21 3:45 PM PATIENT NAME: Jose Garrett [address] [place] [address]   [phone] (home)  DOB: November 26, 1950 MRN: [id number] PRIMARY CARE PROVIDER:    [name], NP,  [place] [address] [phone]  REFERRING PROVIDER:   [name], NP [place],  [place] [address] [phone]  RESPONSIBLE PARTY:    Contact Information     Name Relation Home Work McConnell Daughter [phone]  [phone]   [facility] Daughter   [phone]        I met face to face with patient and family at [facility] facility. Palliative Care was asked to follow this patient by consultation request of  [name], NP to address advance care planning and complex medical decision making. This is the initial visit.                                     ASSESSMENT AND PLAN / RECOMMENDATIONS:   Advance Care Planning/Goals of Care: Goals include to maximize quality of life and symptom management. Patient/health care surrogate gave his/her permission to discuss.Our advance care planning conversation included a discussion about:    The value and importance of advance care planning  Experiences with loved ones who have been seriously ill or have died  Exploration of personal, cultural or spiritual beliefs that might influence medical decisions  Exploration of goals of care in the event of a sudden injury or illness  Identification  of a healthcare agent--eldest daughter Thompson Grayer is going to be HCPOA (just needs to be notarized) Review and updating or creation of an  advance directive document . Decision not to resuscitate or to de-escalate disease focused treatments due to poor prognosis. CODE STATUS:  FULL CODE; is willing to discuss living will--we will email forms to Carrie--this gave her some relief Goal is  to go back home--youngest daughter had provided care  Symptom Management/Plan: Weakness due to spinal stenosis:  continue PT, OT due to goal of returning home though his daughter does not feel this is realistic  Fatigue:  due to ESRD/HD, chf, chronic illness and inadequate intake, allow rest in bed when needed especially post-HD  Decreased po intake:  is trying to eat better, continue nepro and encouraging po intake  Follow up Palliative Care Visit: Palliative care will continue to follow for complex medical decision making, advance care planning, and clarification of goals. Return 2 weeks or prn.  This visit was coded based on medical decision making (MDM).  18 minutes spent on ACP  PPS: 30%  HOSPICE ELIGIBILITY/DIAGNOSIS: TBD/ESRD due to amyloidosis but goals not yet consistent  Chief Complaint: initial palliative consult  HISTORY OF PRESENT ILLNESS:  Jose Garrett. is a 71 y.o. year old male with amyloidosis, cervical DDD with stenosis and spinal cord compression, ESRD on HD m/w/f, depression, PAD s/p R BKA, orthostatic hypotension, CAD with chronic combined chf, hypoxic respiratory failure, protein calorie malnutrition with weight loss, iron deficiency anemia, mixed hyperlipidemia, among others.    He was diagnosed with amyloidosis 6 yrs ago.  He wound up having a R BKA 5 yrs ago.  That led to ESRD and he's been on HD since.  He goes three times per week.  He also has heart failure.  Hydralazine and clonidine have been added tid prn due to high blood pressures.    He's alert and oriented.  Has been here two weeks.  He has DJD in neck and he had surgery 6 wks ago on that due to numbness and weakness in hands and legs.  Still having bilateral weakness in hands and legs.  Went to hospital with weakness.  Then had a gastrointestinal virus at hospital.  Came here for rehab due to weakness.  He'd been depressed when he arrived.  Mood improving. Sleeping better.  Had lost 12 lbs prior  to hospitalization.  He was 157 before hospital, 145 at arrival here and 140.6 lb now.  He is starting to eat better.  Had initial dysphagia--mechanical soft with nectar thick liquids due to that after the cervical surgery.  Taking nepro supplement.    Has R AV fistula.    Cannot pick up and move phone--getting OT to help with fine motor skills/feeding self.  Getting PT--needs gait belt and assist to transfer.  Still has neuropathic pain in arms and hands.  No longer getting percocet for pain.  Before admission at hospital--he did own adls.  Daughter helped with cooking, appts.  Now needs help with everything.  Still making some urine.  Is incontinent.  Having regular bms--uses colace.  Is continent of stool.   History obtained from review of EMR, discussion with primary team, and interview with family, facility staff/caregiver and/or [name].   I reviewed available labs, medications, imaging, studies and related documents from the EMR.  Records reviewed and summarized above.   ROS  General: NAD EYES: denies vision changes, wears glasses ENMT: has dysphagia Cardiovascular: denies chest pain, has DOE Pulmonary: denies cough, has increased SOB Abdomen: endorses poor appetite, denies constipation, endorses some incontinence of bowel GU: denies dysuria, endorses some incontinence of urine  MSK:  has increased weakness,  h/o falls reported Skin: denies rashes or wounds, fragile skin Neurological: denies pain but bothered by numbness/paresthesias, has insomnia and sleeps often in day Psych: Endorses sad mood Heme/lymph/immuno: easily bruises, denies abnormal bleeding  Physical Exam: Current and past weights: 140.6 lbs (see hpi) Constitutional: NAD General: frail appearing, thin EYES: anicteric sclera, lids intact, no discharge  ENMT: intact hearing, oral mucous membranes dry CV: S1S2, RRR, 2/6 murmur, no LE edema, AV fistula with normal pulse and thrill Pulmonary: LCTA, no increased  work of breathing, no cough, on O2 Abdomen: intake 25%, normo-active BS + 4 quadrants, soft and non tender, no ascites GU: deferred MSK: considerable sarcopenia, moves all extremities, R BKA with prosthesis Skin: warm and dry, no rashes or wounds on visible skin, multiple small bruises and thin skin Neuro:  generalized weakness,  no cognitive impairment Psych: non-anxious affect, A and O x 3 Hem/lymph/immuno: no widespread bruising  CURRENT PROBLEM LIST:  Patient Active Problem List   Diagnosis Date Noted   Dysphagia 03/21/2021   Spinal stenosis 02/28/2021   Spinal cord compression ([place]) 02/28/2021   Altered mental status 19/37/9024   Acute metabolic encephalopathy 09/73/5329   Mixed hyperlipidemia 11/04/2020   Volume overload 10/16/2020   Acute hypoxemic respiratory failure ([place]) 10/16/2020   Hypertensive urgency 10/16/2020   Pain due to onychomycosis of toenail of left foot 08/30/2020   Lumbar spondylosis 01/23/2020   Neuropathy 10/06/2019   Gait abnormality 08/06/2019   Back muscle spasm 07/09/2019   Sleep disturbance    Nausea without vomiting    Orthostatic hypotension    Acute on chronic anemia  Chronic combined systolic and diastolic CHF (congestive heart failure) ([place])    ESRD on dialysis ([place])    Right below-knee amputee ([place]) 05/18/2019   Pressure injury of skin 05/14/2019   Enterococcal bacteremia 05/13/2019   Headache, unspecified 01/12/2019   Allergy, unspecified, initial encounter 12/29/2018   Polymorphic ventricular tachycardia 10/24/2018   S/P TAVR (transcatheter aortic valve replacement) 10/21/2018   Anxiety    GERD without esophagitis    Coronary artery disease involving native coronary artery of native heart without angina pectoris    Thrombocytopenia (HCC)    PAD (peripheral artery disease) ([place]) 07/22/2018   Acute on chronic combined systolic and diastolic CHF (congestive heart failure) ([place]) 11/30/2017   Hypercalcemia 06/24/2017   Encounter for  removal of sutures 08/15/2016   Other mechanical complication of surgically created arteriovenous fistula, subsequent encounter 06/22/2016   Unspecified protein-calorie malnutrition ([place]) 02/06/2016   Secondary hyperparathyroidism of renal origin ([place]) 02/06/2016   Pruritus, unspecified 02/06/2016   Other specified coagulation defects ([place]) 02/06/2016   Hypokalemia 02/06/2016   Diarrhea, unspecified 97/67/3419   Complication of vascular dialysis catheter 02/06/2016   Aftercare including intermittent dialysis ([place]) 02/06/2016   ESRD (end stage renal disease) on dialysis ([place])    Iron deficiency anemia 11/22/2015   AL amyloidosis ([place]) 09/25/2014   Anemia of chronic disease 09/25/2014   Essential hypertension, benign 11/06/2013   PAST MEDICAL HISTORY:  Active Ambulatory Problems    Diagnosis Date Noted   Essential hypertension, benign 11/06/2013   AL amyloidosis ([place]) 09/25/2014   Anemia of chronic disease 09/25/2014   Iron deficiency anemia 11/22/2015   ESRD (end stage renal disease) on dialysis [facility])    Acute on chronic combined systolic and diastolic CHF (congestive heart failure) ([place]) 11/30/2017   PAD (peripheral artery disease) ([place]) 07/22/2018   Thrombocytopenia ([place])    Coronary artery disease involving native coronary artery of native heart without angina pectoris    GERD without esophagitis    Anxiety    S/P TAVR (transcatheter aortic valve replacement) 10/21/2018   Polymorphic ventricular tachycardia 10/24/2018   Enterococcal bacteremia 05/13/2019   Pressure injury of skin 05/14/2019   Right below-knee amputee ([place]) 05/18/2019   Orthostatic hypotension    Acute on chronic anemia    Chronic combined systolic and diastolic CHF (congestive heart failure) ([place])    ESRD on dialysis ([place])    Sleep disturbance    Nausea without vomiting    Back muscle spasm 07/09/2019   Gait abnormality 08/06/2019   Neuropathy 10/06/2019   Unspecified protein-calorie malnutrition ([place])  02/06/2016   Secondary hyperparathyroidism of renal origin ([place]) 02/06/2016   Pruritus, unspecified 02/06/2016   Other specified coagulation defects ([place]) 02/06/2016   Other mechanical complication of surgically created arteriovenous fistula, subsequent encounter 06/22/2016   Lumbar spondylosis 01/23/2020   Hypokalemia 02/06/2016   Hypercalcemia 06/24/2017   Headache, unspecified 01/12/2019   Encounter for removal of sutures 08/15/2016   Diarrhea, unspecified 37/90/2409   Complication of vascular dialysis catheter 02/06/2016   Allergy, unspecified, initial encounter 12/29/2018   Aftercare including intermittent dialysis ([place]) 02/06/2016   Pain due to onychomycosis of toenail of left foot 08/30/2020   Volume overload 10/16/2020   Acute hypoxemic respiratory failure ([place]) 10/16/2020   Hypertensive urgency 73/53/2992   Acute metabolic encephalopathy 42/68/3419   Mixed hyperlipidemia 11/04/2020   Altered mental status 11/05/2020   Spinal stenosis 02/28/2021   Spinal cord compression ([place]) 02/28/2021   Dysphagia 03/21/2021   Resolved Ambulatory Problems    Diagnosis  Date Noted   Dependent edema 11/06/2013   DOE (dyspnea on exertion) 11/06/2013   Other malaise and fatigue 11/06/2013   Vitamin D deficiency 01/21/2014   Hypertriglyceridemia 01/21/2014   Chronic renal disease, stage III (HCC)    Hematuria    Proteinuria    Renal hematoma, right 09/08/2014   Chronic diastolic heart failure, NYHA class 1 ([place]) 09/08/2014   Acute blood loss anemia 09/08/2014   Abnormal bruising 09/25/2014   Screening for HIV (human immunodeficiency virus) 10/04/2014   Hip pain 02/18/2015   Back pain 03/18/2015   Seasonal allergies 06/30/2015   Acute pharyngitis 07/08/2015   Cerumen impaction 07/08/2015   B12 deficiency 07/19/2015   Chronic kidney disease with dialysis modality undecided, stage 5 ([place]): Progressive 08/01/2015   Nonrheumatic aortic valve stenosis 08/22/2015   Acute upper respiratory  infection 08/22/2015   Skin tear of right forearm without complication 23/55/7322   Acute diastolic (congestive) heart failure ([place]) 01/23/2016   Fluid overload, unspecified 01/23/2016   Acute pulmonary edema ([place]) 01/23/2016   Acute renal failure superimposed on chronic kidney disease (HCC)    Hypervolemia    CKD (chronic kidney disease) stage 5, GFR less than 15 ml/min (HCC)    Fever    Neuropathy 03/14/2017   Elevated troponin 11/25/2017   Respiratory failure (HCC)    Non-ST elevation (NSTEMI) myocardial infarction [facility])    Dyspnea 03/23/2018   Chest pain    Shortness of breath 03/25/2018   Debility 07/28/2018   Chronic combined systolic and diastolic congestive heart failure (HCC)    ESRD on dialysis ([place])    Hemodialysis-associated hypotension    Acute blood loss anemia    Acute on chronic anemia    Drug induced constipation    Obstipation    Leukocytosis    Chronic diastolic congestive heart failure (HCC)    Left foot infection 09/12/2018   Aortic stenosis 10/09/2018   Bacteremia    Past Medical History:  Diagnosis Date   Amyloidosis (HCC)    CAD (coronary artery disease)    Cancer (HCC)    DJD (degenerative joint disease), lumbar    Dysrhythmia    ESRD (end stage renal disease) (HCC)    GERD (gastroesophageal reflux disease)    Heart murmur    Hyperlipidemia    Hypertension    Myocardial infarction (HCC)    Peripheral vascular disease (HCC)    Pneumonia    PONV (postoperative nausea and vomiting)    Restless legs    Severe aortic stenosis    SOCIAL HX:  Social History   Tobacco Use   Smoking status: Former    Packs/day: 2.00    Years: 40.00    Pack years: 80.00    Types: Cigarettes    Quit date: 03/19/2011    Years since quitting: 10.0   Smokeless tobacco: Never  Substance Use Topics   Alcohol use: No    Alcohol/week: 0.0 standard drinks   FAMILY HX:  Family History  Problem Relation Age of Onset   Hypertension Mother    Diabetes Mother     Heart disease Mother    Skin cancer Mother    Hypertension Father    Diabetes Father    Diabetes Sister       ALLERGIES:  Allergies  Allergen Reactions   Levonorgestrel-Ethinyl Estrad Swelling     PERTINENT MEDICATIONS:  Outpatient Encounter Medications as of 04/12/2021  Medication Sig   albuterol (VENTOLIN HFA) 108 (90 Base) MCG/ACT inhaler Inhale 1 puff  into the lungs every 6 hours as needed (Patient taking differently: Inhale 1-2 puffs into the lungs every 6 (six) hours as needed for wheezing or shortness of breath.)   amiodarone (PACERONE) 200 MG tablet TAKE 1 TABLET (200 MG TOTAL) BY MOUTH DAILY. (Patient taking differently: Take 200 mg by mouth daily.)   amLODipine (NORVASC) 10 MG tablet Take 1 tablet (10 mg total) by mouth at bedtime.   atorvastatin (LIPITOR) 80 MG tablet TAKE 1 TABLET BY MOUTH DAILY WITH SUPPER (Patient taking differently: Take 80 mg by mouth daily with supper.)   B Complex-C-Folic Acid (RENA-VITE RX) 1 MG TABS Take 1 tablet by mouth once a day   bisacodyl (DULCOLAX) 10 MG suppository Place 1 suppository (10 mg total) rectally daily as needed for severe constipation.   calcitRIOL (ROCALTROL) 0.5 MCG capsule Take 2 capsules (1 mcg total) by mouth every Monday, Wednesday, and Friday with hemodialysis.   calcium carbonate (TUMS - DOSED IN MG ELEMENTAL CALCIUM) 500 MG chewable tablet Chew 1 tablet (200 mg of elemental calcium total) by mouth 3 (three) times daily with meals as needed for indigestion or heartburn.   clopidogrel (PLAVIX) 75 MG tablet TAKE 1 TABLET BY MOUTH DAILY WITH SUPPER (Patient taking differently: Take 75 mg by mouth daily with supper.)   Darbepoetin Alfa (ARANESP) 100 MCG/0.5ML SOSY injection Inject 0.5 mLs (100 mcg total) into the vein every Friday with hemodialysis.   docusate sodium (COLACE) 100 MG capsule Take 1 capsule (100 mg total) by mouth 2 (two) times daily. (Patient taking differently: Take 100 mg by mouth daily as needed for moderate  constipation.)   ethyl chloride spray Apply 1 spray to dialysis access prior to dialysis as directed.   famotidine (PEPCID) 20 MG tablet Take 1 tablet (20 mg total) by mouth at bedtime.   lanthanum (FOSRENOL) 1000 MG chewable tablet Chew 2,000 mg by mouth 3 (three) times daily.   loperamide (IMODIUM) 2 MG capsule Take 1 capsule (2 mg total) by mouth as needed for diarrhea or loose stools.   multivitamin (RENA-VIT) TABS tablet Take 1 tablet by mouth at bedtime.   Nutritional Supplements (,FEEDING SUPPLEMENT, PROSOURCE PLUS) liquid Take 30 mLs by mouth 3 (three) times daily between meals.   Nutritional Supplements (FEEDING SUPPLEMENT, NEPRO CARB STEADY,) LIQD Take 237 mLs by mouth 3 (three) times daily between meals.   ondansetron (ZOFRAN) 4 MG tablet TAKE 1 TABLET BY MOUTH TWICE A DAY AS NEEDED (Patient taking differently: Take 4 mg by mouth 2 (two) times daily as needed for nausea or vomiting.)   pantoprazole (PROTONIX) 40 MG tablet Take 1 tablet (40 mg total) by mouth daily.   polyethylene glycol (MIRALAX / GLYCOLAX) 17 g packet Take 17 g by mouth daily.   senna-docusate (SENOKOT-S) 8.6-50 MG tablet Take 2 tablets by mouth 2 (two) times daily.   vitamin B-12 (CYANOCOBALAMIN) 1000 MCG tablet Take 1 tablet (1,000 mcg total) by mouth every evening.   No facility-administered encounter medications on file as of 04/12/2021.   Thank you for the opportunity to participate in the care of [name].  The palliative care team will continue to follow. Please call our office at [phone] if we can be of additional assistance.   Shawneen Deetz, DO ,   COVID-19 PATIENT SCREENING TOOL Asked and negative response unless otherwise noted:  Have you had symptoms of covid, tested positive or been in contact with someone with symptoms/positive test in the past 5-10 days?

## 2021-04-24 ENCOUNTER — Encounter (HOSPITAL_COMMUNITY): Payer: Self-pay

## 2021-04-24 ENCOUNTER — Emergency Department (HOSPITAL_COMMUNITY): Payer: Medicare Other

## 2021-04-24 ENCOUNTER — Other Ambulatory Visit: Payer: Self-pay

## 2021-04-24 ENCOUNTER — Emergency Department (HOSPITAL_COMMUNITY)
Admission: EM | Admit: 2021-04-24 | Discharge: 2021-04-24 | Disposition: A | Discharge status: Skilled Nursing Facility | Payer: Medicare Other | Attending: Emergency Medicine | Admitting: Emergency Medicine

## 2021-04-24 DIAGNOSIS — Z7901 Long term (current) use of anticoagulants: Secondary | ICD-10-CM | POA: Insufficient documentation

## 2021-04-24 DIAGNOSIS — Z20822 Contact with and (suspected) exposure to covid-19: Secondary | ICD-10-CM | POA: Diagnosis not present

## 2021-04-24 DIAGNOSIS — R5383 Other fatigue: Secondary | ICD-10-CM | POA: Insufficient documentation

## 2021-04-24 DIAGNOSIS — Z79899 Other long term (current) drug therapy: Secondary | ICD-10-CM | POA: Insufficient documentation

## 2021-04-24 DIAGNOSIS — R509 Fever, unspecified: Secondary | ICD-10-CM | POA: Diagnosis present

## 2021-04-24 LAB — CBC WITH DIFFERENTIAL/PLATELET
Abs Immature Granulocytes: 0.1 10*3/uL — ABNORMAL HIGH (ref 0.00–0.07)
Basophils Absolute: 0 10*3/uL (ref 0.0–0.1)
Basophils Relative: 0 %
Eosinophils Absolute: 0.1 10*3/uL (ref 0.0–0.5)
Eosinophils Relative: 2 %
HCT: 26.4 % — ABNORMAL LOW (ref 39.0–52.0)
Hemoglobin: 8.2 g/dL — ABNORMAL LOW (ref 13.0–17.0)
Immature Granulocytes: 1 %
Lymphocytes Relative: 15 %
Lymphs Abs: 1.3 10*3/uL (ref 0.7–4.0)
MCH: 30.1 pg (ref 26.0–34.0)
MCHC: 31.1 g/dL (ref 30.0–36.0)
MCV: 97.1 fL (ref 80.0–100.0)
Monocytes Absolute: 0.9 10*3/uL (ref 0.1–1.0)
Monocytes Relative: 11 %
Neutro Abs: 5.8 10*3/uL (ref 1.7–7.7)
Neutrophils Relative %: 71 %
Platelets: 140 10*3/uL — ABNORMAL LOW (ref 150–400)
RBC: 2.72 MIL/uL — ABNORMAL LOW (ref 4.22–5.81)
RDW: 15.2 % (ref 11.5–15.5)
WBC: 8.3 10*3/uL (ref 4.0–10.5)
nRBC: 0 % (ref 0.0–0.2)

## 2021-04-24 LAB — RESP PANEL BY RT-PCR (FLU A&B, COVID) ARPGX2
Influenza A by PCR: NEGATIVE
Influenza B by PCR: NEGATIVE
SARS Coronavirus 2 by RT PCR: NEGATIVE

## 2021-04-24 LAB — BASIC METABOLIC PANEL
Anion gap: 14 (ref 5–15)
BUN: 23 mg/dL (ref 8–23)
CO2: 32 mmol/L (ref 22–32)
Calcium: 9.4 mg/dL (ref 8.9–10.3)
Chloride: 96 mmol/L — ABNORMAL LOW (ref 98–111)
Creatinine, Ser: 5.22 mg/dL — ABNORMAL HIGH (ref 0.61–1.24)
GFR, Estimated: 11 mL/min — ABNORMAL LOW (ref >60)
Glucose, Bld: 103 mg/dL — ABNORMAL HIGH (ref 70–99)
Potassium: 4.1 mmol/L (ref 3.5–5.1)
Sodium: 142 mmol/L (ref 135–145)

## 2021-04-24 LAB — MAGNESIUM: Magnesium: 1.9 mg/dL (ref 1.7–2.4)

## 2021-04-24 MED ORDER — AMOXICILLIN-POT CLAVULANATE 500-125 MG PO TABS: 1.0000 | ORAL_TABLET | Freq: Two times a day (BID) | ORAL | 0 refills | Status: AC

## 2021-04-24 MED ORDER — OXYCODONE-ACETAMINOPHEN 5-325 MG PO TABS
1.0000 | ORAL_TABLET | Freq: Once | ORAL | Status: AC
  Administered 2021-04-24: 1 via ORAL
  Filled 2021-04-24: qty 1

## 2021-04-24 NOTE — ED Notes (Signed)
Pt to xray from triage.

## 2021-04-24 NOTE — Discharge Instructions (Signed)
Call your primary care doctor or specialist as discussed in the next 2-3 days.   Return immediately back to the ER if:  Your symptoms worsen within the next 12-24 hours. You develop new symptoms such as new fevers, persistent vomiting, new pain, shortness of breath, or new weakness or numbness, or if you have any other concerns.  

## 2021-04-24 NOTE — ED Provider Triage Note (Signed)
Emergency Medicine Provider Triage Evaluation Note  Jose Garrett. , a 71 y.o. male  was evaluated in triage.  Pt complains of weakness, somnolence of several day duration.  Patient was at dialysis and was unable to complete the dialysis session.  Had 25 minutes life.  Denies chest pain, shortness of breath, abdominal pain.  He does have a productive cough with chronic.  States he had fever today which was checked by his roommate with a temporal thermometer with a temperature of 101.  Without other complaints.  Review of Systems  Positive: As above Negative: As above  Physical Exam  BP (!) 164/56    Pulse 84    Temp 99.4 F (37.4 C) (Oral)    Resp 15    SpO2 96%  Gen:   Awake, no distress   Resp:  Normal effort  MSK:   Moves extremities without difficulty  Other:    Medical Decision Making  Medically screening exam initiated at 7:08 PM.  Appropriate orders placed.  Jose Garrett. was informed that the remainder of the evaluation will be completed by another provider, this initial triage assessment does not replace that evaluation, and the importance of remaining in the ED until their evaluation is complete.     Evlyn Courier, PA-C 04/24/21 1918

## 2021-04-24 NOTE — ED Notes (Signed)
Called [facility] to transport patient to [facility].  

## 2021-04-24 NOTE — ED Triage Notes (Addendum)
Patient arrived by [facility] from dialysis center for weakness, fatigue, and fever. Also complains of back and shoulder pain. Dialysis was cut short by 25 minutes. No cp and no SOB. Reports he started feeling poorly yesterday

## 2021-04-26 NOTE — ED Provider Notes (Signed)
[place] EMERGENCY DEPARTMENT Provider Note     CSN: [id number] Arrival date & time: 04/24/21  1832     History  No chief complaint on file.     Jose Garrett. is a 71 y.o. male.   Patient presents with chief complaint of generalized fatigue.  He is at a rehab facility.  He was noted to have a fever after dialysis today Tmax of 102.  Patient states he otherwise has no symptoms.  He denies any headache denies any cough denies sore throat denies headache chest pain or abdominal pain.  Denies vomiting or diarrhea.       Home Medications        Prior to Admission medications   Medication Sig Start Date End Date Taking? Authorizing Provider  albuterol (VENTOLIN HFA) 108 (90 Base) MCG/ACT inhaler Inhale 1 puff into the lungs every 6 hours as needed Patient taking differently: Inhale 1-2 puffs into the lungs every 6 (six) hours as needed for wheezing or shortness of breath. 01/24/21        amiodarone (PACERONE) 200 MG tablet TAKE 1 TABLET (200 MG TOTAL) BY MOUTH DAILY. Patient taking differently: Take 200 mg by mouth daily. 09/07/20 09/07/21   Burnell Blanks, MD  amLODipine (NORVASC) 10 MG tablet Take 1 tablet (10 mg total) by mouth at bedtime. 10/18/20     Oswald Hillock, MD  atorvastatin (LIPITOR) 80 MG tablet TAKE 1 TABLET BY MOUTH DAILY WITH SUPPER Patient taking differently: Take 80 mg by mouth daily with supper. 04/28/20 04/28/21   Burnell Blanks, MD  B Complex-C-Folic Acid (RENA-VITE RX) 1 MG TABS Take 1 tablet by mouth once a day 10/19/20        bisacodyl (DULCOLAX) 10 MG suppository Place 1 suppository (10 mg total) rectally daily as needed for severe constipation. 03/24/21     Terrilee Croak, MD  calcitRIOL (ROCALTROL) 0.5 MCG capsule Take 2 capsules (1 mcg total) by mouth every Monday, Wednesday, and Friday with hemodialysis. 03/24/21     Terrilee Croak, MD  calcium carbonate (TUMS - DOSED IN MG ELEMENTAL CALCIUM) 500 MG chewable tablet Chew 1 tablet  (200 mg of elemental calcium total) by mouth 3 (three) times daily with meals as needed for indigestion or heartburn. 03/24/21     Terrilee Croak, MD  clopidogrel (PLAVIX) 75 MG tablet TAKE 1 TABLET BY MOUTH DAILY WITH SUPPER Patient taking differently: Take 75 mg by mouth daily with supper. 04/28/20 04/28/21   Burnell Blanks, MD  Darbepoetin Alfa (ARANESP) 100 MCG/0.5ML SOSY injection Inject 0.5 mLs (100 mcg total) into the vein every Friday with hemodialysis. 05/29/19     Angiulli, Lavon Paganini, PA-C  docusate sodium (COLACE) 100 MG capsule Take 1 capsule (100 mg total) by mouth 2 (two) times daily. Patient taking differently: Take 100 mg by mouth daily as needed for moderate constipation. 05/27/19     Angiulli, Lavon Paganini, PA-C  ethyl chloride spray Apply 1 spray to dialysis access prior to dialysis as directed. 01/18/21        famotidine (PEPCID) 20 MG tablet Take 1 tablet (20 mg total) by mouth at bedtime. 03/24/21     Dahal, Marlowe Aschoff, MD  lanthanum (FOSRENOL) 1000 MG chewable tablet Chew 2,000 mg by mouth 3 (three) times daily. 07/15/20     [provider]  loperamide (IMODIUM) 2 MG capsule Take 1 capsule (2 mg total) by mouth as needed for diarrhea or loose stools. 03/24/21  Terrilee Croak, MD  multivitamin (RENA-VIT) TABS tablet Take 1 tablet by mouth at bedtime. 08/12/18     Angiulli, Lavon Paganini, PA-C  Nutritional Supplements (,FEEDING SUPPLEMENT, PROSOURCE PLUS) liquid Take 30 mLs by mouth 3 (three) times daily between meals. 03/24/21     DahalMarlowe Aschoff, MD  Nutritional Supplements (FEEDING SUPPLEMENT, NEPRO CARB STEADY,) LIQD Take 237 mLs by mouth 3 (three) times daily between meals. 03/24/21     Dahal, Marlowe Aschoff, MD  ondansetron (ZOFRAN) 4 MG tablet TAKE 1 TABLET BY MOUTH TWICE A DAY AS NEEDED Patient taking differently: Take 4 mg by mouth 2 (two) times daily as needed for nausea or vomiting. 06/28/20 06/28/21   Wellington Hampshire, MD  pantoprazole (PROTONIX) 40 MG tablet Take 1 tablet (40 mg total)  by mouth daily. 05/28/19     Angiulli, Lavon Paganini, PA-C  polyethylene glycol (MIRALAX / GLYCOLAX) 17 g packet Take 17 g by mouth daily. 03/25/21     Dahal, Marlowe Aschoff, MD  senna-docusate (SENOKOT-S) 8.6-50 MG tablet Take 2 tablets by mouth 2 (two) times daily. 03/24/21     Terrilee Croak, MD  vitamin B-12 (CYANOCOBALAMIN) 1000 MCG tablet Take 1 tablet (1,000 mcg total) by mouth every evening. 05/27/19     Angiulli, Lavon Paganini, PA-C       Allergies            Levonorgestrel-ethinyl estrad     Review of Systems   Review of Systems  Constitutional:  Positive for fever.  HENT:  Negative for ear pain and sore throat.   Eyes:  Negative for pain.  Respiratory:  Negative for cough.   Cardiovascular:  Negative for chest pain.  Gastrointestinal:  Negative for abdominal pain.  Genitourinary:  Negative for flank pain.  Musculoskeletal:  Negative for back pain.  Skin:  Negative for color change and rash.  Neurological:  Negative for syncope.  All other systems reviewed and are negative.   Physical Exam Updated Vital Signs BP (!) 170/51    Pulse 80    Temp 98.7 F (37.1 C) (Oral)    Resp (!) 22    SpO2 98%  Physical Exam Constitutional:      Appearance: He is well-developed.  HENT:     Head: Normocephalic.     Nose: Nose normal.  Eyes:     Extraocular Movements: Extraocular movements intact.  Cardiovascular:     Rate and Rhythm: Normal rate.  Pulmonary:     Effort: Pulmonary effort is normal.  Abdominal:     Tenderness: There is no abdominal tenderness. There is no guarding or rebound.  Musculoskeletal:     Cervical back: Normal range of motion and neck supple. No rigidity or tenderness.  Lymphadenopathy:     Cervical: No cervical adenopathy.  Skin:    Coloration: Skin is not jaundiced.  Neurological:     Mental Status: He is alert. Mental status is at baseline.      ED Results / Procedures / Treatments   Labs (all labs ordered are listed, but only abnormal results are displayed)       Labs Reviewed  CBC WITH DIFFERENTIAL/PLATELET - Abnormal; Notable for the following components:      Result Value     RBC 2.72 (*)      Hemoglobin 8.2 (*)      HCT 26.4 (*)      Platelets 140 (*)      Abs Immature Granulocytes 0.10 (*)      All other components  within normal limits  BASIC METABOLIC PANEL - Abnormal; Notable for the following components:    Chloride 96 (*)      Glucose, Bld 103 (*)      Creatinine, Ser 5.22 (*)      GFR, Estimated 11 (*)      All other components within normal limits  RESP PANEL BY RT-PCR (FLU A&B, COVID) ARPGX2  CULTURE, BLOOD (ROUTINE X 2)  CULTURE, BLOOD (ROUTINE X 2)  MAGNESIUM      EKG None   Radiology  Imaging Results (Last 48 hours)  DG Chest 2 View   Result Date: 04/24/2021 CLINICAL DATA:  Weakness, cough EXAM: CHEST - 2 VIEW COMPARISON:  02/28/2021 FINDINGS: Mild cardiomegaly. No confluent opacities or effusions. No acute bony abnormality. IMPRESSION: No active cardiopulmonary disease. Electronically Signed   By: Rolm Baptise M.D.   On: 04/24/2021 19:39       Procedures Procedures     Medications Ordered in ED Medications  oxyCODONE-acetaminophen (PERCOCET/ROXICET) 5-325 MG per tablet 1 tablet (1 tablet Oral Given 04/24/21 2213)      ED Course/ Medical Decision Making/ A&P                         Medical Decision Making Risk Prescription drug management.     Chart review shows visit with palliative care nurse on April 12, 2021.  Patient states he has chronic back pain requesting Percocet which was given.  Labs today otherwise unremarkable.  Patient is afebrile here.  Heart rate normal blood pressure mildly elevated.  Oxygenation normal.  Chemistry consistent with end-stage kidney disease.  COVID panel is negative.  Blood cultures are sent and pending.  Chest x-ray done with no evidence of infiltrate effusion or edema.  Patient calm comfortable with no pain or discomfort.  Etiology of his reported fever at dialysis  unclear.  We will give trial of antibiotics, instructed to follow-up with primary care doctor in 3 to 4 days.  Advised immediate return for worsening symptoms persistent fevers or any additional concerns.         Final Clinical Impression(s) / ED Diagnoses Final diagnoses:  Febrile illness      Rx / DC Orders ED Discharge Orders       None                 Luna Fuse, MD 04/26/21 [phone]

## 2021-04-29 LAB — CULTURE, BLOOD (ROUTINE X 2)
Culture: NO GROWTH
Special Requests: ADEQUATE

## 2021-07-05 ENCOUNTER — Encounter (HOSPITAL_COMMUNITY): Payer: Self-pay | Admitting: *Deleted

## 2021-07-05 ENCOUNTER — Encounter: Payer: Self-pay | Admitting: Hematology

## 2021-07-05 ENCOUNTER — Non-Acute Institutional Stay: Payer: Medicare Other | Admitting: Internal Medicine

## 2021-07-05 ENCOUNTER — Encounter: Payer: Self-pay | Admitting: Internal Medicine

## 2021-07-05 VITALS — BP 132/78 | HR 68 | Temp 97.7°F | Resp 17 | Wt 140.5 lb

## 2021-07-05 DIAGNOSIS — R630 Anorexia: Secondary | ICD-10-CM

## 2021-07-05 DIAGNOSIS — M4802 Spinal stenosis, cervical region: Secondary | ICD-10-CM

## 2021-07-05 DIAGNOSIS — R531 Weakness: Secondary | ICD-10-CM

## 2021-07-05 DIAGNOSIS — I5042 Chronic combined systolic (congestive) and diastolic (congestive) heart failure: Secondary | ICD-10-CM

## 2021-07-05 DIAGNOSIS — N186 End stage renal disease: Secondary | ICD-10-CM

## 2021-07-05 DIAGNOSIS — Z515 Encounter for palliative care: Secondary | ICD-10-CM

## 2021-07-05 NOTE — Progress Notes (Signed)
Therapist, nutritional Palliative Care Follow-Up Visit Telephone: [phone]  Fax: [phone]   Date of encounter: 07/05/21 2:34 PM PATIENT NAME: Jose Garrett [address] [place] [address]   [phone] (home)  DOB: [birth year] MRN: [id number] PRIMARY CARE PROVIDER:    Loura Back, NP,  [address] [place] [address] [phone]  REFERRING PROVIDER:   Loura Back, NP [address] [place],  [place] [address] [phone]  RESPONSIBLE PARTY:    Contact Information     Name Relation [place] Daughter [phone]  [phone]   [facility] Daughter   [phone]        I met face to face with patient and family in [facility]. Palliative Care was asked to follow this patient by consultation request of  Loura Back, NP to address advance care planning and complex medical decision making. This is follow-up visit.                                     ASSESSMENT AND PLAN / RECOMMENDATIONS:   Advance Care Planning/Goals of Care: Goals include to maximize quality of life and symptom management. Patient/health care surrogate gave his/her permission to discuss.Our advance care planning conversation included a discussion about:    The value and importance of advance care planning  Experiences with loved ones who have been seriously ill or have died  Exploration of personal, cultural or spiritual beliefs that might influence medical decisions  Exploration of goals of care in the event of a sudden injury or illness  Identification  of a healthcare agent  Review and updating or creation of an  advance directive document . Decision not to resuscitate or to de-escalate disease focused treatments due to poor prognosis. CODE STATUS:  FULL CODE, patient reports he'd like to continue dialysis at this time--first says he has no choice, but later says he knows he does, but that's his best option at this point.  Was very tired  after attending HD and nodding off over a lunch that was less than 25% eaten from what I could see.    Symptom Management/Plan: 1. ESRD on dialysis [facility]) -continue HD m/w/f am -gets extreme fatigue afterward, but does elect to continue  2. Chronic combined systolic and diastolic CHF (congestive heart failure) (HCC) -appears on dry side after session today -continue current regimen per primary team and HD for volume control  3. Generalized weakness -due to esrd/amyloid/spinal stenosis/chf -pt not ready to change goals of care at this time  4. Spinal stenosis of cervical region -ongoing issue, as well, remains in wheelchair for mobility  5. Loss of appetite -is tired of his thickened liquids and mechanical soft diet--would prefer to eat regular food though understands risks of aspiration -is down about 5 lbs from time of admission in January despite supplements  6. Palliative care encounter -MOST has not been completed, need to arrange a meeting with him and his daughter to get this done; goals are aggressive at this time per patient himself with ongoing HD     Follow up Palliative Care Visit: Palliative care will continue to follow for complex medical decision making, advance care planning, and clarification of goals. Return 4 weeks or prn.  This visit was coded based on medical decision making (MDM).  PPS: 40%  HOSPICE ELIGIBILITY/DIAGNOSIS: TBD  Chief Complaint: Follow-up palliative visit  HISTORY OF  PRESENT ILLNESS:  Jose Arns. is a 71 y.o. year old male  with ESRD on HD, amyloid with prior amputation, chronic combined chf, cervical spinal stenosis, among others seen for palliative care follow=up.  He was extremely exhausted post-HD, but goals for him remain quite aggressive including ongoing dialysis treatments and full code status.  Need to meet with his daughter as I was never able to do this previously.   History obtained from review of EMR, discussion with  primary team, and interview with family, facility staff/caregiver and/or Jose Garrett.  I reviewed available labs, medications, imaging, studies and related documents from the EMR.  Records reviewed and summarized above.   ROS  General: NAD EYES: denies vision changes ENMT: has dysphagia Cardiovascular: denies chest pain, has DOE Pulmonary: denies cough, has increased SOB Abdomen: endorses poor appetite esp for modified food, denies constipation, endorses some incontinence of bowel GU: denies dysuria MSK:  has increased weakness,  no falls reported Skin: has skin tear right hand/wrist area Neurological: denies pain, denies insomnia Psych: Endorses somewhat depressed mood Heme/lymph/immuno: easily bruises, abnormal bleeding  Physical Exam: Current and past weights:  140 lbs and previously 145 lbs in Jan Constitutional: NAD General: frail appearing, thin sitting up in wheelchair falling asleep, pale slightly jaundice appearing EYES: anicteric sclera, lids intact, no discharge  ENMT: intact hearing, oral mucous membranes moist, dentition intact CV: S1S2, RRR, no LE edema Pulmonary: LCTA, no increased work of breathing, no cough, room air Abdomen: intake less than 25% of lunch today, normo-active BS + 4 quadrants, soft and non tender, no ascites GU: deferred MSK: sarcopenia, moves all extremities, amputation of LE, uses wheelchair for mobility Skin: warm and dry, skin tear right hand/wrist Neuro:   generalized weakness,  no cognitive impairment Psych: non-anxious affect, A and O x 3, somewhat flat affect Hem/lymph/immuno:  widespread bruising but several ecchymoses of dorsal hands CURRENT PROBLEM LIST:  Patient Active Problem List   Diagnosis Date Noted   Dysphagia 03/21/2021   Spinal stenosis 02/28/2021   Spinal cord compression ([place]) 02/28/2021   Altered mental status 60/12/9321   Acute metabolic encephalopathy 55/73/2202   Mixed hyperlipidemia 11/04/2020   Volume overload  10/16/2020   Acute hypoxemic respiratory failure ([place]) 10/16/2020   Hypertensive urgency 10/16/2020   Pain due to onychomycosis of toenail of left foot 08/30/2020   Lumbar spondylosis 01/23/2020   Neuropathy 10/06/2019   Gait abnormality 08/06/2019   Back muscle spasm 07/09/2019   Sleep disturbance    Nausea without vomiting    Orthostatic hypotension    Acute on chronic anemia    Chronic combined systolic and diastolic CHF (congestive heart failure) (HCC)    ESRD on dialysis ([place])    Right below-knee amputee ([place]) 05/18/2019   Pressure injury of skin 05/14/2019   Enterococcal bacteremia 05/13/2019   Headache, unspecified 01/12/2019   Allergy, unspecified, initial encounter 12/29/2018   Polymorphic ventricular tachycardia ([place]) 10/24/2018   S/P TAVR (transcatheter aortic valve replacement) 10/21/2018   Anxiety    GERD without esophagitis    Coronary artery disease involving native coronary artery of native heart without angina pectoris    Thrombocytopenia (HCC)    PAD (peripheral artery disease) ([place]) 07/22/2018   Acute on chronic combined systolic and diastolic CHF (congestive heart failure) ([place]) 11/30/2017   Hypercalcemia 06/24/2017   Encounter for removal of sutures 08/15/2016   Other mechanical complication of surgically created arteriovenous fistula, subsequent encounter 06/22/2016   Unspecified protein-calorie malnutrition ([place]) 02/06/2016  Secondary hyperparathyroidism of renal origin ([place]) 02/06/2016   Pruritus, unspecified 02/06/2016   Other specified coagulation defects ([place]) 02/06/2016   Hypokalemia 02/06/2016   Diarrhea, unspecified 92/42/6834   Complication of vascular dialysis catheter 02/06/2016   Aftercare including intermittent dialysis ([place]) 02/06/2016   ESRD (end stage renal disease) on dialysis [facility])    Iron deficiency anemia 11/22/2015   AL amyloidosis ([place]) 09/25/2014   Anemia of chronic disease 09/25/2014   Essential hypertension, benign 11/06/2013    PAST MEDICAL HISTORY:  Active Ambulatory Problems    Diagnosis Date Noted   Essential hypertension, benign 11/06/2013   AL amyloidosis ([place]) 09/25/2014   Anemia of chronic disease 09/25/2014   Iron deficiency anemia 11/22/2015   ESRD (end stage renal disease) on dialysis [facility])    Acute on chronic combined systolic and diastolic CHF (congestive heart failure) ([place]) 11/30/2017   PAD (peripheral artery disease) ([place]) 07/22/2018   Thrombocytopenia ([place])    Coronary artery disease involving native coronary artery of native heart without angina pectoris    GERD without esophagitis    Anxiety    S/P TAVR (transcatheter aortic valve replacement) 10/21/2018   Polymorphic ventricular tachycardia ([place]) 10/24/2018   Enterococcal bacteremia 05/13/2019   Pressure injury of skin 05/14/2019   Right below-knee amputee ([place]) 05/18/2019   Orthostatic hypotension    Acute on chronic anemia    Chronic combined systolic and diastolic CHF (congestive heart failure) ([place])    ESRD on dialysis ([place])    Sleep disturbance    Nausea without vomiting    Back muscle spasm 07/09/2019   Gait abnormality 08/06/2019   Neuropathy 10/06/2019   Unspecified protein-calorie malnutrition ([place]) 02/06/2016   Secondary hyperparathyroidism of renal origin ([place]) 02/06/2016   Pruritus, unspecified 02/06/2016   Other specified coagulation defects ([place]) 02/06/2016   Other mechanical complication of surgically created arteriovenous fistula, subsequent encounter 06/22/2016   Lumbar spondylosis 01/23/2020   Hypokalemia 02/06/2016   Hypercalcemia 06/24/2017   Headache, unspecified 01/12/2019   Encounter for removal of sutures 08/15/2016   Diarrhea, unspecified 19/62/2297   Complication of vascular dialysis catheter 02/06/2016   Allergy, unspecified, initial encounter 12/29/2018   Aftercare including intermittent dialysis ([place]) 02/06/2016   Pain due to onychomycosis of toenail of left foot 08/30/2020   Volume overload  10/16/2020   Acute hypoxemic respiratory failure ([place]) 10/16/2020   Hypertensive urgency 98/92/1194   Acute metabolic encephalopathy 17/40/8144   Mixed hyperlipidemia 11/04/2020   Altered mental status 11/05/2020   Spinal stenosis 02/28/2021   Spinal cord compression ([place]) 02/28/2021   Dysphagia 03/21/2021   Resolved Ambulatory Problems    Diagnosis Date Noted   Dependent edema 11/06/2013   DOE (dyspnea on exertion) 11/06/2013   Other malaise and fatigue 11/06/2013   Vitamin D deficiency 01/21/2014   Hypertriglyceridemia 01/21/2014   Chronic renal disease, stage III (HCC)    Hematuria    Proteinuria    Renal hematoma, right 09/08/2014   Chronic diastolic heart failure, NYHA class 1 ([place]) 09/08/2014   Acute blood loss anemia 09/08/2014   Abnormal bruising 09/25/2014   Screening for HIV (human immunodeficiency virus) 10/04/2014   Hip pain 02/18/2015   Back pain 03/18/2015   Seasonal allergies 06/30/2015   Acute pharyngitis 07/08/2015   Cerumen impaction 07/08/2015   B12 deficiency 07/19/2015   Chronic kidney disease with dialysis modality undecided, stage 5 ([place]): Progressive 08/01/2015   Nonrheumatic aortic valve stenosis 08/22/2015   Acute upper respiratory infection 08/22/2015   Skin tear of right forearm  without complication 22/29/7989   Acute diastolic (congestive) heart failure ([place]) 01/23/2016   Fluid overload, unspecified 01/23/2016   Acute pulmonary edema ([place]) 01/23/2016   Acute renal failure superimposed on chronic kidney disease (HCC)    Hypervolemia    CKD (chronic kidney disease) stage 5, GFR less than 15 ml/min (HCC)    Fever    Neuropathy 03/14/2017   Elevated troponin 11/25/2017   Respiratory failure (HCC)    Non-ST elevation (NSTEMI) myocardial infarction [facility])    Dyspnea 03/23/2018   Chest pain    Shortness of breath 03/25/2018   Debility 07/28/2018   Chronic combined systolic and diastolic congestive heart failure (HCC)    ESRD on dialysis ([place])     Hemodialysis-associated hypotension    Acute blood loss anemia    Acute on chronic anemia    Drug induced constipation    Obstipation    Leukocytosis    Chronic diastolic congestive heart failure (HCC)    Left foot infection 09/12/2018   Aortic stenosis 10/09/2018   Bacteremia    Past Medical History:  Diagnosis Date   Amyloidosis (HCC)    CAD (coronary artery disease)    Cancer (HCC)    DJD (degenerative joint disease), lumbar    Dysrhythmia    ESRD (end stage renal disease) (HCC)    GERD (gastroesophageal reflux disease)    Heart murmur    Hyperlipidemia    Hypertension    Myocardial infarction ([place])    Peripheral vascular disease (HCC)    Pneumonia    PONV (postoperative nausea and vomiting)    Restless legs    Severe aortic stenosis    SOCIAL HX:  Social History   Tobacco Use   Smoking status: Former    Packs/day: 2.00    Years: 40.00    Pack years: 80.00    Types: Cigarettes    Quit date: 03/19/2011    Years since quitting: 10.3   Smokeless tobacco: Never  Substance Use Topics   Alcohol use: No    Alcohol/week: 0.0 standard drinks     ALLERGIES:  Allergies  Allergen Reactions   Levonorgestrel-Ethinyl Estrad Swelling     PERTINENT MEDICATIONS:  MED METOPROLOL TARTRATE 25 MG TAB TAKE 1/2 TABLET ( 12.$RemoveBef'5MG'[id number]$  ) BY MOUTH TWICE A DAY DeMarchi, William 05/19/21, 4:30 PM MED CLONIDINE HCL 0.1 MG TABLET TAKE 1 TABLET BY MOUTH THREE TIMES A DAY IF SBP IS GREATER THAN 150MMHG . HOLD IF SBP LESS THAN 150 MMHG [place], [place] 04/11/21, 11:30 AM MED PANTOPRAZOLE SOD DR 40 MG TAB TAKE 1 TABLET BY MOUTH ONCE DAILY (DO NOT CRUSH) (RtP) Gastro-esophageal reflux disease without esophagitis (K21.9) Nyra Capes, [place] 04/07/21, 10:00 AM MED CALCITRIOL 0.5 MCG CAPSULE TAKE 2 CAPSULES =1MCG BY MOUTH WEEKLY ON MONDAY, WEDNESDAY, AND FRIDAY WITH HEMODIALYSIS (DO NOT CRUSH) Nyra Capes, [place] 04/07/21, 10:00 AM MED RENA-VITE RX TABLET TAKE 1 TABLET BY MOUTH ONCE  DAILY Nyra Capes, [place] 04/07/21, 8:00 AM MED AMIODARONE HCL 200 MG TABLET TAKE 1 TABLET BY MOUTH ONCE DAILY Athscl heart disease of native coronary artery w/o ang pctrs (I25.10) Nyra Capes, [place] 04/07/21, 8:00 AM MED ETHYL CHLORIDE SPRAY SPRAY TO DIALYSIS ACCESS PRIOR TO DIALYSIS AS DIRECTED Nyra Capes, [place] 04/07/21, 12:00 AM MED CLOPIDOGREL 75 MG TABLET TAKE 1 TABLET BY MOUTH EVERY EVENING (WITH SUPPER) Athscl heart disease of native coronary artery w/o ang pctrs (I25.10) Hodges, [place] 04/06/21, 6:00 PM MED ATORVASTATIN 80 MG TABLET TAKE 1 TABLET BY MOUTH EVERY EVENING (WITH SUPPER) Mixed hyperlipidemia (E78.2) [place], [place]  04/06/21, 6:00 PM MED HYDRALAZINE 50 MG TABLET TAKE 1 TABLET BY MOUTH EVERY 8 HOURS Hyp chr kidney disease w stage 5 chr kidney disease or ESRD (I12.0) Nyra Capes, [place] 04/06/21, 2:00 PM MED FAMOTIDINE 20 MG TABLET TAKE 1 TABLET BY MOUTH AT BEDTIME Nyra Capes, [place] 04/06/21, 2:00 PM MED VITAMIN B-12 1,000 MCG TABLET TAKE 1 TABLET BY MOUTH AT BEDTIME Maryella Shivers 04/06/21, 1:30 PM MED LANTHANUM CARB 1,000 MG TB CHW TAKE 2 TABLETS =$RemoveBe'2000MG'[id number]$  BY MOUTH THREE TIMES A DAY End stage renal disease (N18.6) Nyra Capes, Beatriz Chancellor 04/06/21, 1:00 PM MED AMLODIPINE BESYLATE 10 MG TAB TAKE 1 TABLET BY MOUTH AT BEDTIME Hyp chr kidney disease w stage 5 chr kidney disease or ESRD (I12.0) Nyra Capes, [place] 04/06/21, 1:00 PM MED SENEXON-S 50-8.6 MG TABLET TAKE 2 TABLETS BY MOUTH TWICE A Clint Guy, [place] 04/06/21, 12:30 PM MED CALCIUM ANTACID 500 MG CHW TAB TAKE 1 TABLET BY MOUTH THREE TIMES DAILY AS NEEDED WITH MEALS - INDIGESTION/HEARTBURN Maryella Shivers 04/06/21, 12:30 PM MED EUCERIN SKIN CALMING CREAM APPLY TOPICALLY TO BACK TWICE A DAY - [place], [place] 04/06/21, 12:30 PM MED DOCUSATE SODIUM 100 MG SOFTGEL TAKE 1 CAPSULE BY MOUTH TWICE A DAY (DO NOT CRUSH) [place]ges, [place] 04/04/21, 11:30 AM MED ALBUTEROL HFA 90 MCG INHALER INHALE 1  PUFF INTO THE LUNGS EVERY 6 HOURS AS NEEDED FOR WHEEZING Berneta Levins, [place] 03/30/21, 2:00 PM MED LOPERAMIDE 2 MG CAPSULE TAKE 1 CAPSULE BY MOUTH AS NEEDED - DIARRHEA/LOOSE STOOLS EVERY 4 HOURS (DO NOT CRUSH) Nyra Capes, [place] 03/30/21, 2:00 PM MED ONDANSETRON HCL 4 MG TABLET TAKE 1 TABLET BY MOUTH TWICE DAILY AS NEEDED FOR NAUSEA AND VOMITING Maryella Shivers 03/30/21, 1:00 PM MED MIRALAX POWDER :Mix 17 gm in 4 oz of water and give by mouth daily Maryella Shivers 03/24/21, 1:43 PM MED BISACODYL 10 MG SUPPOSITORY :Place 1 suppository rectally daily as needed for severe constipation Maryella Shivers 03/24/21, 1:15 PM  Thank you for the opportunity to participate in the care of Jose Garrett.  The palliative care team will continue to follow. Please call our office at [phone] if we can be of additional assistance.   Hollace Kinnier, DO  COVID-19 PATIENT SCREENING TOOL Asked and negative response unless otherwise noted:  Have you had symptoms of covid, tested positive or been in contact with someone with symptoms/positive test in the past 5-10 days? no

## 2021-08-10 DEATH — deceased

## 2021-10-03 ENCOUNTER — Encounter (HOSPITAL_COMMUNITY): Payer: Self-pay | Admitting: *Deleted

## 2021-10-03 ENCOUNTER — Encounter: Payer: Self-pay | Admitting: Hematology

## 2021-10-04 ENCOUNTER — Encounter: Payer: Self-pay | Admitting: Hematology

## 2021-10-04 ENCOUNTER — Encounter (HOSPITAL_COMMUNITY): Payer: Self-pay | Admitting: *Deleted

## 2021-10-11 ENCOUNTER — Ambulatory Visit: Payer: Medicare Other | Admitting: Cardiovascular Disease

## 2021-10-11 NOTE — Progress Notes (Deleted)
No chief complaint on file.  History of Present Illness: 71 yo male with history of ESRD on HD, AL amyloid (kidney involvement), aortic stenosis s/p TAVR, HTN, HLD, GERD, anemia, chronic combined CHF, PAD and CAD who is here today for cardiac follow up. He was admitted to [facility] September 2019 with acute respiratory failure in setting of hypertensive urgency. His troponin was elevated and his EKG showed diffuse ST depression. Cardiac cath 11/25/17 with occlusion of the RCA which was felt to be the culprit but this vessel could not be engaged. The distal RCA filled from collaterals. There was moderate disease in the ostial Circumflex and mild disease in the LAD. Medical therapy was recommended. LVEF=35-40%. There was moderate aortic stenosis. Echo on 12/19/17 with LVEF=65-70%. Moderate AS (mean gradient 31 mmHg). His amyloid is followed by oncology. He was admitted to [facility] January 2020 with dyspnea. Cardiac cath 03/25/18 with stable CAD. Moderate AS by cath. In May 2020 the patient was hospitalized with critical limb ischemia involving his left lower extremity with 2 gangrenous toes. He was found to have bilateral chronic occlusion of the superficial femoral arteries as well as the left popliteal artery.  He underwent left common femoral to below-knee popliteal artery bypass graft using synthetic Gore-Tex graft. Postoperatively, he suffered an acute exacerbation of chronic combined systolic and diastolic congestive heart failure with mildly positive troponins consistent with NSTEMI.  Catheterization revealed no significant change in his coronary artery disease with chronic occlusion of the right coronary artery and no significant disease in the left coronary circulation.  Echocardiogram revealed further drop in left ventricular systolic function with ejection fraction estimated 35 to 40% with severe aortic stenosis. His subsequent recovery was slow and he later underwent amputation of his gangrenous toes.  He  became unable to tolerate HD and it led [place] to treat his AS. He underwent successful TAVR with a 26 mm Edwards Sapien Ultra THV via the right TF approach on 10/21/18. Post operative echo showed persistent moderate to severe LV systolic dysfunction (EF 37-16%) with normally functioning TAVR with no AI and mean gradient 16 mmHg. He was started on aspirin and plavix, but aspirin later discontinued given worsening thrombocytopenia. Frequent runs of polymorphic VT was noted on tele. He was started on amiodarone and LifeVest was placed. Echo September 2020 with LVEF=35-40%. The Lifevest was stopped. He was readmitted February 2021 with critical limb ischemia of RLE and underwent right fem pop bypass and later right transmetatarsal amputation followed by BKA later in the year. LVEF=45-50% by echo in August 2021. His PAD is followed in our [place] clinic by Dr. Fletcher Anon. He was admitted to [facility] in January 2023 with spinal cord compression requiring surgical correction.   He is here today for follow up. The patient denies any chest pain, dyspnea, palpitations, lower extremity edema, orthopnea, PND, dizziness, near syncope or syncope.     Primary Care Physician: [name], NP  Past Medical History:  Diagnosis Date   Amyloidosis [facility])    Anemia of chronic disease    Anxiety    CAD (coronary artery disease)    a. cardiac cath on 11/25/17 with occlusion of the RCA which could have been his event but the RCA could not be engaged. The RCA filled distally from left to right collaterals. There were no severe stenoses in the left coronary system. Medical therapy recommended, started on plavix.   Cancer [facility])    multiple myeloma   Chronic combined systolic and diastolic CHF (congestive heart  failure) ([place])    a. EF dropped to 35-40% with grade 2 DD by echo 11/2017.   DJD (degenerative joint disease), lumbar    Dysrhythmia    afib   ESRD (end stage renal disease) (HCC)    ESRD- HEMO MWF- [address]   GERD  (gastroesophageal reflux disease)    Heart murmur    Hyperlipidemia    Hypertension    Myocardial infarction (HCC)    Neuropathy    Peripheral vascular disease (HCC)    Pneumonia    PONV (postoperative nausea and vomiting)    Restless legs    S/P TAVR (transcatheter aortic valve replacement) 10/21/2018   26 mm Edwards Sapien 3 Ultra transcatheter heart valve placed via percutaneous right transfemoral approach    Severe aortic stenosis    severe    Past Surgical History:  Procedure Laterality Date   ABDOMINAL AORTOGRAM W/LOWER EXTREMITY Bilateral 07/16/2018   Procedure: ABDOMINAL AORTOGRAM W/LOWER EXTREMITY;  Surgeon: Wellington Hampshire, MD;  Location: [place] [facility];  Service: Cardiovascular;  Laterality: Bilateral;   ABDOMINAL AORTOGRAM W/LOWER EXTREMITY N/A 04/09/2019   Procedure: ABDOMINAL AORTOGRAM W/LOWER EXTREMITY - Right Lower;  Surgeon: Waynetta Sandy, MD;  Location: [place] [facility];  Service: Cardiovascular;  Laterality: N/A;   AMPUTATION Left 08/19/2018   Procedure: AMPUTATION LEFT TOES SECOND AND THIRD;  Surgeon: Waynetta Sandy, MD;  Location: [place];  Service: Vascular;  Laterality: Left;   AMPUTATION Right 05/07/2019   Procedure: AMPUTATION BELOW KNEE;  Surgeon: Serafina Mitchell, MD;  Location: [facility] OR;  Service: Vascular;  Laterality: Right;   ANTERIOR CERVICAL DECOMP/DISCECTOMY FUSION N/A 03/17/2021   Procedure: Cervical three to cervial six anterior cervical decompression, discectomy and fusion;  Surgeon: Consuella Lose, MD;  Location: [place];  Service: Neurosurgery;  Laterality: N/A;   AV FISTULA PLACEMENT Right 12/27/2015   Procedure: Right Arm ARTERIOVENOUS (AV) FISTULA CREATION;  Surgeon: Angelia Mould, MD;  Location: [place];  Service: Vascular;  Laterality: Right;   CARDIAC CATHETERIZATION     CARDIAC VALVE REPLACEMENT     CATARACT EXTRACTION W/ INTRAOCULAR LENS  IMPLANT, BILATERAL     COLONOSCOPY     ERCP W/ SPHICTEROTOMY      EYE SURGERY     FEMORAL-POPLITEAL BYPASS GRAFT Left 07/22/2018   Procedure: left FEMORAL TO BELOW KNEE POPLITEAL ARTERY BYPASS GREAFT using 57m removable ring propaten gore graft;  Surgeon: CWaynetta Sandy MD;  [place]  Service: Vascular;  Laterality: Left;   FEMORAL-POPLITEAL BYPASS GRAFT Right 04/10/2019   Procedure: BYPASS GRAFT RIGHT FEMORAL-POPLITEAL ARTERY;  Surgeon: ERosetta Posner MD;  [place]  Service: Vascular;  Laterality: Right;   GANGLION CYST EXCISION Left 11/02/2020   Procedure: EXCISION OF LEFT WRIST DORAL VOLAR MASSES;  Surgeon: WCharlotte Crumb MD;  [place]  Service: Orthopedics;  Laterality: Left;  CPT 25073   I & D EXTREMITY Right 05/05/2019   Procedure: Revision of  RIGHT  FOOT Amputation;  Surgeon: CWaynetta Sandy MD;  [place]  Service: Vascular;  Laterality: Right;   INTRAOPERATIVE TRANSTHORACIC ECHOCARDIOGRAM N/A 10/21/2018   Procedure: Intraoperative Transthoracic Echocardiogram;  Surgeon: CSherren Mocha MD;  [place]  Service: Open Heart Surgery;  Laterality: N/A;   IR GENERIC HISTORICAL  01/27/2016   IR FLUORO GUIDE CV LINE RIGHT 01/27/2016 DArne Cleveland MD [facility]INTER[facility]   IR GENERIC HISTORICAL  01/27/2016   IR [place] VASC ACCESS RIGHT 01/27/2016 DArne Cleveland MD  [facility]   LAPAROSCOPIC CHOLECYSTECTOMY  03/2011   LEFT HEART CATH AND CORONARY ANGIOGRAPHY N/A 11/25/2017   Procedure: LEFT HEART CATH AND CORONARY ANGIOGRAPHY;  Surgeon: Martinique, Peter M, MD;  Location: [place] [facility];  Service: Cardiovascular;  Laterality: N/A;   LEFT HEART CATH AND CORONARY ANGIOGRAPHY N/A 07/24/2018   Procedure: LEFT HEART CATH AND CORONARY ANGIOGRAPHY;  Surgeon: Sherren Mocha, MD;  Location: [place] [facility];  Service: Cardiovascular;  Laterality: N/A;   RENAL BIOPSY, PERCUTANEOUS Right 09/08/2014   RIGHT/LEFT HEART CATH AND CORONARY ANGIOGRAPHY N/A 03/25/2018   Procedure: RIGHT/LEFT HEART CATH AND  CORONARY ANGIOGRAPHY;  Surgeon: Martinique, Peter M, MD;  Location: [place] [facility];  Service: Cardiovascular;  Laterality: N/A;   TEE WITHOUT CARDIOVERSION N/A 05/14/2019   Procedure: TRANSESOPHAGEAL ECHOCARDIOGRAM (TEE);  Surgeon: Fay Records, MD;  Location: [facility] ENDOSCOPY;  Service: Cardiovascular;  Laterality: N/A;   TOE AMPUTATION Left 08/19/2018   2nd & 3rd toes   TONSILLECTOMY  ~ 1960   TRANSCATHETER AORTIC VALVE REPLACEMENT, TRANSFEMORAL N/A 10/21/2018   Procedure: TRANSCATHETER AORTIC VALVE REPLACEMENT, TRANSFEMORAL;  Surgeon: Sherren Mocha, MD;  Location: [place];  Service: Open Heart Surgery;  Laterality: N/A;   TRANSMETATARSAL AMPUTATION Right 04/29/2019   Procedure: RIGHT TRANSMETATARSAL AMPUTATION;  Surgeon: Waynetta Sandy, MD;  Location: [place];  Service: Vascular;  Laterality: Right;   ULTRASOUND GUIDANCE FOR VASCULAR ACCESS  03/25/2018   Procedure: Ultrasound Guidance For Vascular Access;  Surgeon: Martinique, Peter M, MD;  Location: [place] [facility];  Service: Cardiovascular;;    Current Outpatient Medications  Medication Sig Dispense Refill   albuterol (VENTOLIN HFA) 108 (90 Base) MCG/ACT inhaler Inhale 1 puff into the lungs every 6 hours as needed (Patient taking differently: Inhale 1-2 puffs into the lungs every 6 (six) hours as needed for wheezing or shortness of breath.) 8.5 g 11   amiodarone (PACERONE) 200 MG tablet TAKE 1 TABLET (200 MG TOTAL) BY MOUTH DAILY. (Patient taking differently: Take 200 mg by mouth daily.) 90 tablet 2   amLODipine (NORVASC) 10 MG tablet Take 1 tablet (10 mg total) by mouth at bedtime. 30 tablet 3   atorvastatin (LIPITOR) 80 MG tablet TAKE 1 TABLET BY MOUTH DAILY WITH SUPPER (Patient taking differently: Take 80 mg by mouth daily with supper.) 90 tablet 3   B Complex-C-Folic Acid (RENA-VITE RX) 1 MG TABS Take 1 tablet by mouth once a day 90 tablet 3   bisacodyl (DULCOLAX) 10 MG suppository Place 1 suppository (10 mg total) rectally daily  as needed for severe constipation. 12 suppository 0   calcitRIOL (ROCALTROL) 0.5 MCG capsule Take 2 capsules (1 mcg total) by mouth every Monday, Wednesday, and Friday with hemodialysis.     calcium carbonate (TUMS - DOSED IN MG ELEMENTAL CALCIUM) 500 MG chewable tablet Chew 1 tablet (200 mg of elemental calcium total) by mouth 3 (three) times daily with meals as needed for indigestion or heartburn.     Darbepoetin Alfa (ARANESP) 100 MCG/0.5ML SOSY injection Inject 0.5 mLs (100 mcg total) into the vein every Friday with hemodialysis. 4.2 mL    docusate sodium (COLACE) 100 MG capsule Take 1 capsule (100 mg total) by mouth 2 (two) times daily. (Patient taking differently: Take 100 mg by mouth daily as needed for moderate constipation.) 10 capsule 0   ethyl chloride spray Apply 1 spray to dialysis access prior to dialysis as directed. 116 mL 3   famotidine (PEPCID) 20 MG tablet Take 1 tablet (  20 mg total) by mouth at bedtime.     lanthanum (FOSRENOL) 1000 MG chewable tablet Chew 2,000 mg by mouth 3 (three) times daily.     loperamide (IMODIUM) 2 MG capsule Take 1 capsule (2 mg total) by mouth as needed for diarrhea or loose stools. 30 capsule 0   multivitamin (RENA-VIT) TABS tablet Take 1 tablet by mouth at bedtime. 30 tablet 0   Nutritional Supplements (,FEEDING SUPPLEMENT, PROSOURCE PLUS) liquid Take 30 mLs by mouth 3 (three) times daily between meals.     Nutritional Supplements (FEEDING SUPPLEMENT, NEPRO CARB STEADY,) LIQD Take 237 mLs by mouth 3 (three) times daily between meals.  0   pantoprazole (PROTONIX) 40 MG tablet Take 1 tablet (40 mg total) by mouth daily. 30 tablet 0   polyethylene glycol (MIRALAX / GLYCOLAX) 17 g packet Take 17 g by mouth daily. 14 each 0   senna-docusate (SENOKOT-S) 8.6-50 MG tablet Take 2 tablets by mouth 2 (two) times daily.     vitamin B-12 (CYANOCOBALAMIN) 1000 MCG tablet Take 1 tablet (1,000 mcg total) by mouth every evening. 30 tablet 0   No current  facility-administered medications for this visit.    Allergies  Allergen Reactions   Levonorgestrel-Ethinyl Estrad Swelling    Social History   Socioeconomic History   Marital status: Divorced    Spouse name: Not on file   Number of children: 3   Years of education: Not on file   Highest education level: Not on file  Occupational History   Not on file  Tobacco Use   Smoking status: Former    Packs/day: 2.00    Years: 40.00    Total pack years: 80.00    Types: Cigarettes    Quit date: 03/19/2011    Years since quitting: 10.5   Smokeless tobacco: Never  Vaping Use   Vaping Use: Never used  Substance and Sexual Activity   Alcohol use: No    Alcohol/week: 0.0 standard drinks of alcohol   Drug use: No    Comment:  "I have used marijuana till the 1990's". Notes that he quit 15 yrs ago   Sexual activity: Not Currently  Other Topics Concern   Not on file  Social History Narrative   Pt lives alone in a 1 story home   Has 3 adult daughters   Highest level of education: associates degree   Worked in [employer], broadcasting/film/video.    Left Handed    Lives in a one story home        Social Determinants of Health   Financial Resource Strain: Not on file  Food Insecurity: Not on file  Transportation Needs: Not on file  Physical Activity: Not on file  Stress: Not on file  Social Connections: Not on file  Intimate Partner Violence: Not on file    Family History  Problem Relation Age of Onset   Hypertension Mother    Diabetes Mother    Heart disease Mother    Skin cancer Mother    Hypertension Father    Diabetes Father    Diabetes Sister     Review of Systems:  As stated in the HPI and otherwise negative.   There were no vitals taken for this visit.  Physical Examination:  General: Well developed, well nourished, NAD  HEENT: OP clear, mucus membranes moist  SKIN: warm, dry. No rashes. Neuro: No focal deficits  Musculoskeletal: Muscle strength 5/5 all ext   Psychiatric: Mood and affect normal  Neck: No  JVD, no carotid bruits, no thyromegaly, no lymphadenopathy.  Lungs:Clear bilaterally, no wheezes, rhonci, crackles Cardiovascular: Regular rate and rhythm. No murmurs, gallops or rubs. Abdomen:Soft. Bowel sounds present. Non-tender.  Extremities: No lower extremity edema. ***  Echo August 2021:  1. Left ventricular ejection fraction, by estimation, is 45 to 50%. The  left ventricle has mildly decreased function. The left ventricle  demonstrates global hypokinesis. There is moderate concentric left  ventricular hypertrophy. Left ventricular  diastolic parameters are consistent with Grade I diastolic dysfunction  (impaired relaxation). There is hypokinesis of the left ventricular, basal  inferior wall and inferolateral wall.   2. Right ventricular systolic function is mildly reduced. The right  ventricular size is normal. Tricuspid regurgitation signal is inadequate  for assessing PA pressure.   3. Left atrial size was mild to moderately dilated.   4. The mitral valve is abnormal. Trivial mitral valve regurgitation. No  evidence of mitral stenosis.   5. The aortic valve has been repaired/replaced. Aortic valve  regurgitation is not visualized. There is a 26 mm Edwards Sapien  prosthetic (TAVR) valve present in the aortic position. Procedure Date:  10/21/2018. Echo findings are consistent with normal  structure and function of the aortic valve prosthesis. Aortic valve mean  gradient measures 9.0 mmHg. Aortic valve Vmax measures 1.95 m/s.   EKG:  EKG is *** ordered today. The ekg ordered today demonstrates   Recent Labs: 11/04/2020: B Natriuretic Peptide 784.8; TSH 0.829 02/27/2021: ALT 18 04/24/2021: BUN 23; Creatinine, Ser 5.22; Hemoglobin 8.2; Magnesium 1.9; Platelets 140; Potassium 4.1; Sodium 142   Lipid Panel    Component Value Date/Time   CHOL 129 05/03/2020 0728   TRIG 172 (H) 05/03/2020 0728   HDL 32 (L) 05/03/2020 0728    CHOLHDL 4.0 05/03/2020 0728   CHOLHDL 7.4 02/25/2014 0959   VLDL 40 02/25/2014 0959   LDLCALC 68 05/03/2020 0728     Wt Readings from Last 3 Encounters:  07/05/21 140 lb 8 oz (63.7 kg)  03/24/21 149 lb 14.6 oz (68 kg)  11/14/20 154 lb 15.7 oz (70.3 kg)    Assessment and Plan:   1. CAD without angina: Cardiac cath January 2020 with stable CAD, known chronic occlusion of the RCA. No chest pain. Continue *** Plavix, *** Coreg and statin.      2. Ischemic Cardiomyopathy/Chronic combined CHF: LVEF=45-50% by echo August 2021. Volume control by dialysis. *** ?  Continue Coreg.      3. Aortic stenosis: He is s/p TAVR in August 2020. He stopped his Plavix. Will continue SBE prophylaxis as needed.   4. HTN: BP is controlled. Managed by Nephrology  5. Carotid artery disease: Mild bilateral carotid artery disease by dopplers October 2019.   6. ESRD: on HD.   7. Hyperlipidemia: LDL at goal in 2022. Continue statin  8. NSVT: No recent palpitations. Continue amiodarone.    Current medicines are reviewed at length with the patient today.  The patient does not have concerns regarding medicines.  The following changes have been made:  no change  Labs/ tests ordered today include:   No orders of the defined types were placed in this encounter.    Disposition:   F/U with me in one year    Signed, Lauree Chandler, MD 10/11/2021 11:19 AM    [address], [place], [place]  [address] Phone: [phone]; Fax: [phone]
